# Patient Record
Sex: Female | Born: 1980 | Race: White | Hispanic: No | Marital: Married | Smoking: Current every day smoker
Health system: Southern US, Community
[De-identification: ages and names within clinical notes are randomized; demographics above are authoritative.]

## PROBLEM LIST (undated history)

## (undated) DIAGNOSIS — R112 Nausea with vomiting, unspecified: Secondary | ICD-10-CM

## (undated) DIAGNOSIS — I219 Acute myocardial infarction, unspecified: Secondary | ICD-10-CM

## (undated) DIAGNOSIS — I639 Cerebral infarction, unspecified: Secondary | ICD-10-CM

## (undated) DIAGNOSIS — N289 Disorder of kidney and ureter, unspecified: Secondary | ICD-10-CM

## (undated) DIAGNOSIS — I739 Peripheral vascular disease, unspecified: Secondary | ICD-10-CM

## (undated) DIAGNOSIS — I2510 Atherosclerotic heart disease of native coronary artery without angina pectoris: Secondary | ICD-10-CM

## (undated) DIAGNOSIS — G629 Polyneuropathy, unspecified: Secondary | ICD-10-CM

## (undated) DIAGNOSIS — I509 Heart failure, unspecified: Secondary | ICD-10-CM

## (undated) DIAGNOSIS — Z9889 Other specified postprocedural states: Secondary | ICD-10-CM

## (undated) DIAGNOSIS — N184 Chronic kidney disease, stage 4 (severe): Secondary | ICD-10-CM

## (undated) DIAGNOSIS — D649 Anemia, unspecified: Secondary | ICD-10-CM

## (undated) DIAGNOSIS — E119 Type 2 diabetes mellitus without complications: Secondary | ICD-10-CM

## (undated) HISTORY — DX: Acute myocardial infarction, unspecified: I21.9

## (undated) HISTORY — PX: TUBAL LIGATION: SHX77

## (undated) HISTORY — DX: Anemia, unspecified: D64.9

## (undated) HISTORY — PX: OTHER SURGICAL HISTORY: SHX169

## (undated) HISTORY — DX: Cerebral infarction, unspecified: I63.9

## (undated) HISTORY — PX: CHOLECYSTECTOMY: SHX55

---
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
## 2002-04-19 ENCOUNTER — Emergency Department (HOSPITAL_COMMUNITY): Admission: EM | Admit: 2002-04-19 | Discharge: 2002-04-19 | Payer: Self-pay | Admitting: Internal Medicine

## 2004-01-21 ENCOUNTER — Emergency Department (HOSPITAL_COMMUNITY): Admission: EM | Admit: 2004-01-21 | Discharge: 2004-01-21 | Payer: Self-pay | Admitting: Emergency Medicine

## 2004-01-22 ENCOUNTER — Observation Stay (HOSPITAL_COMMUNITY): Admission: AD | Admit: 2004-01-22 | Discharge: 2004-01-22 | Payer: Self-pay | Admitting: Family Medicine

## 2004-02-10 ENCOUNTER — Ambulatory Visit (HOSPITAL_COMMUNITY): Admission: RE | Admit: 2004-02-10 | Discharge: 2004-02-10 | Payer: Self-pay | Admitting: Family Medicine

## 2004-03-31 ENCOUNTER — Ambulatory Visit: Payer: Self-pay | Admitting: Gastroenterology

## 2004-04-02 ENCOUNTER — Ambulatory Visit (HOSPITAL_COMMUNITY): Admission: RE | Admit: 2004-04-02 | Discharge: 2004-04-02 | Payer: Self-pay | Admitting: Internal Medicine

## 2004-04-02 IMAGING — RF DG UGI W/ SMALL BOWEL
14 of 24 series · 14 of 24 positions shown · non-contrast
Comparison: none

CLINICAL DATA: Right lower quadrant abdominal pain and epigastric abdominal pain.  Diarrhea.  Family history of Crohn's disease.
 UPPER GI SERIES AND SMALL BOWEL FOLLOW-THROUGH:
 Double contrast upper GI series shows a normal appearance of the esophagus.  There is no evidence of an esophageal mass or stricture.  There is no evidence of a hiatal hernia, and no gastroesophageal reflux was seen during the study despite provocative maneuvers.  Esophageal motility is within normal limits.  
 The stomach is normal in appearance.  There is no evidence of gastric masses or ulcers.  The duodenal bulb and sweep are normal in appearance.  The patient swallowed a 13 mm barium tablet which passed freely through the esophagus and into the stomach.
 The small bowel follow-through shows no evidence of dilated small bowel loops.  No masses or strictures are seen involving the small bowel.  The jejunal and ileal fold patterns are within normal limits.  There is no evidence of small bowel wall thickening and spot views of the terminal ileum are normal in appearance.

[Series 3: run · 1 of 1 slices shown (1 of 14)]
[im 1/1]
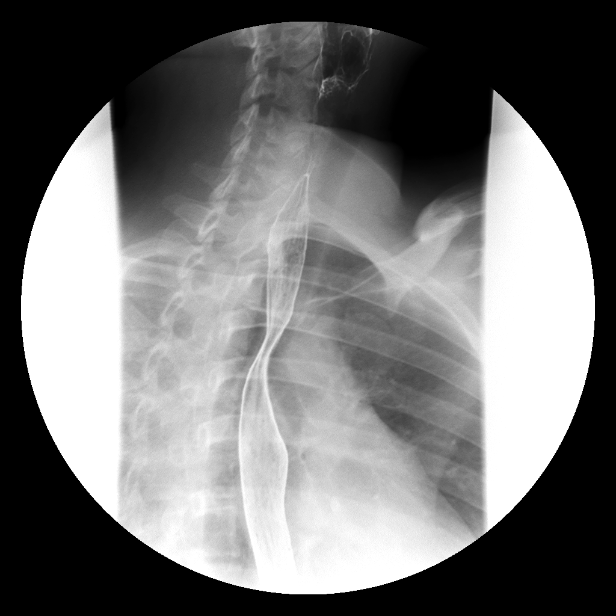

[Series 5: run · 1 of 1 slices shown (2 of 14)]
[im 1/1]
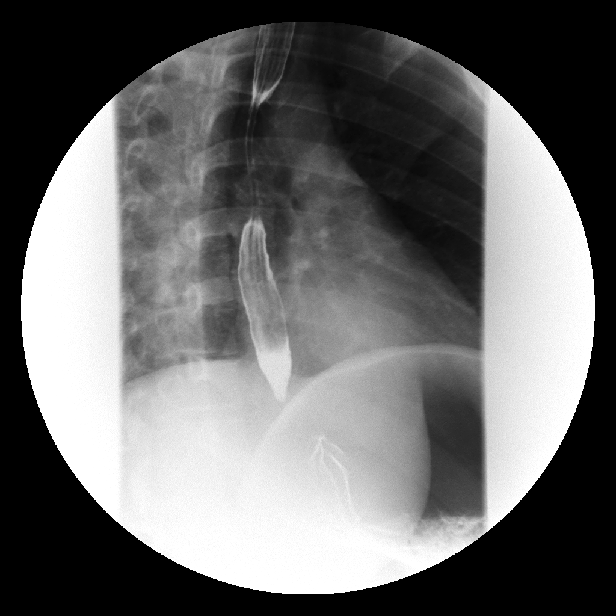

[Series 7: run · 1 of 1 slices shown (3 of 14)]
[im 1/1]
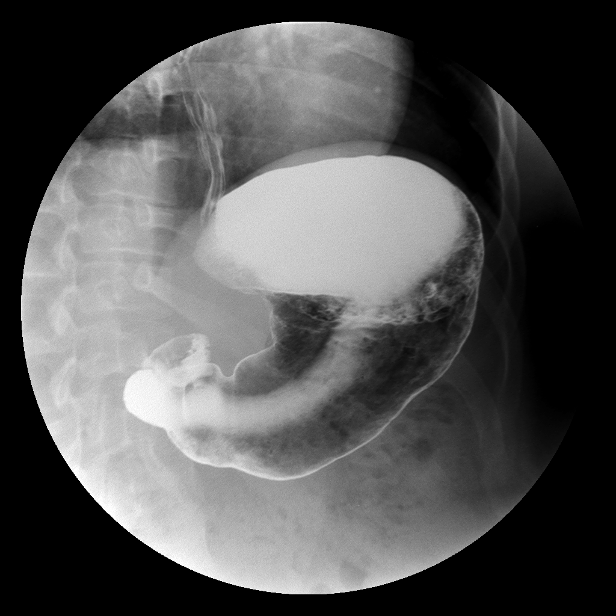

[Series 9: run · 1 of 1 slices shown (4 of 14)]
[im 1/1]
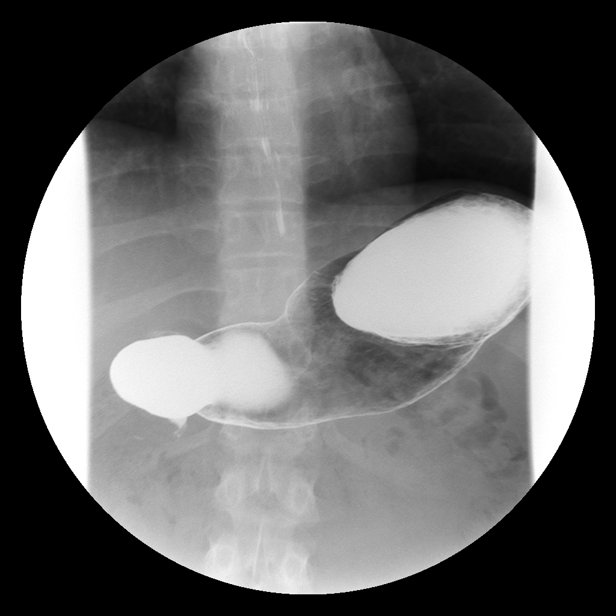

[Series 10: run · 1 of 1 slices shown (5 of 14)]
[im 1/1]
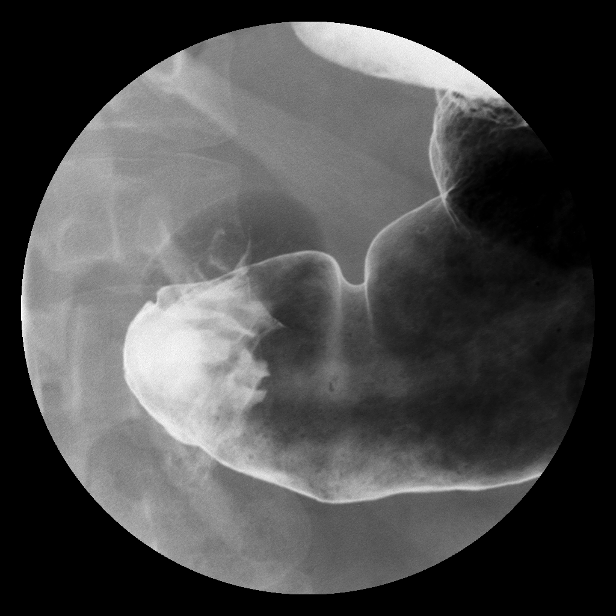

[Series 12: run · 1 of 1 slices shown (6 of 14)]
[im 1/1]
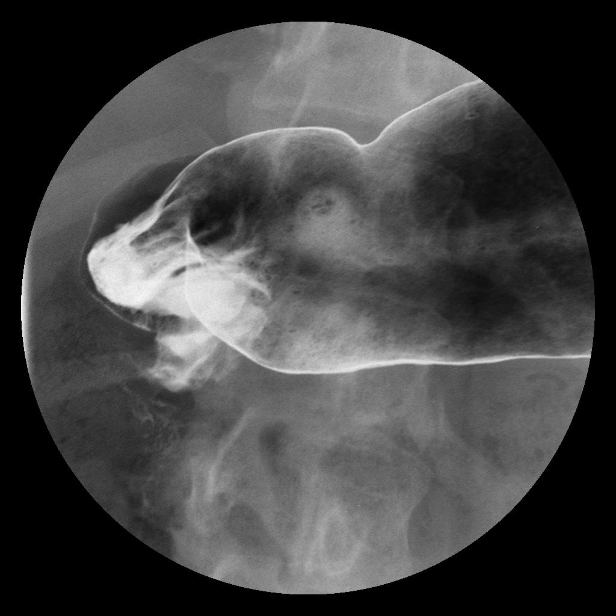

[Series 14: run · 1 of 1 slices shown (7 of 14)]
[im 1/1]
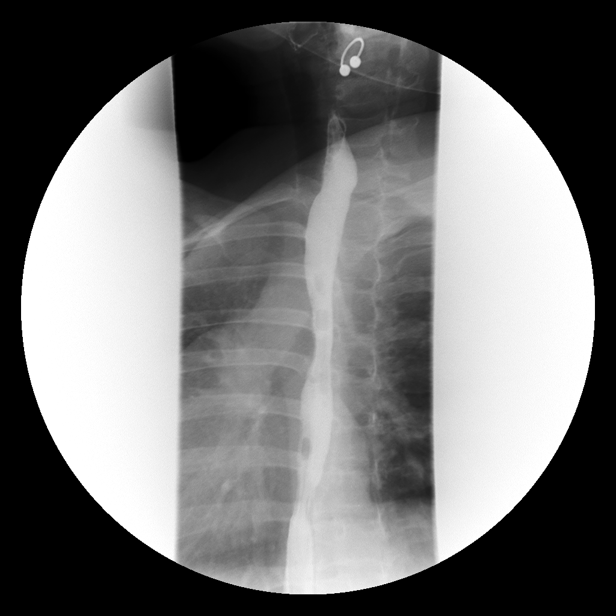

[Series 15: run · 1 of 1 slices shown (8 of 14)]
[im 1/1]
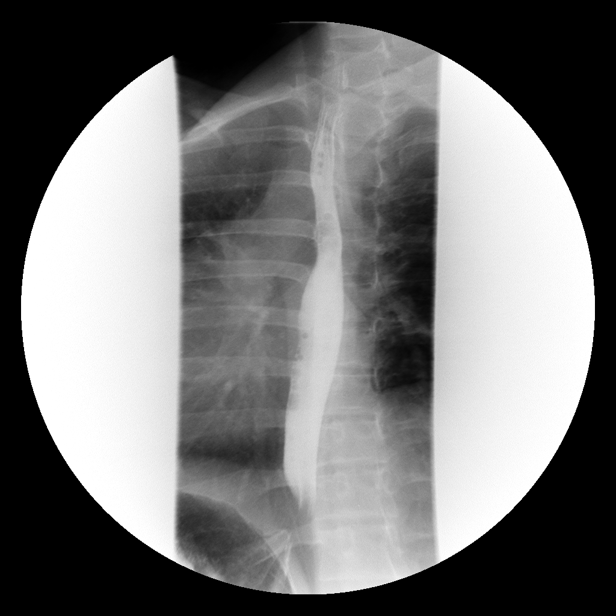

[Series 17: run · 1 of 1 slices shown (9 of 14)]
[im 1/1]
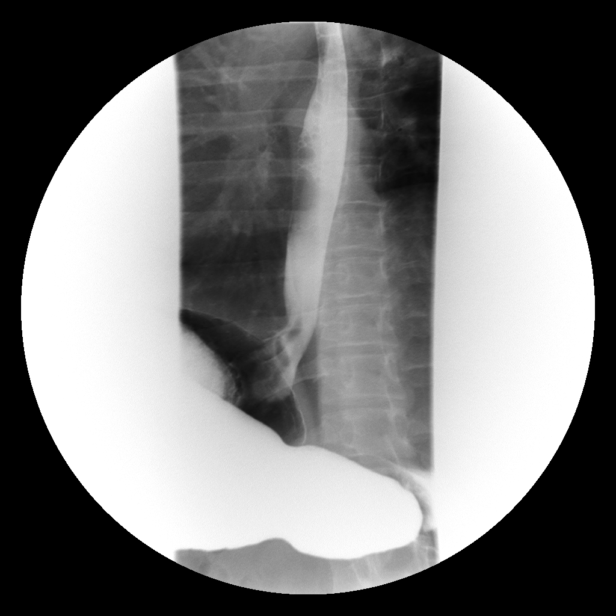

[Series 19: run · 1 of 1 slices shown (10 of 14)]
[im 1/1]
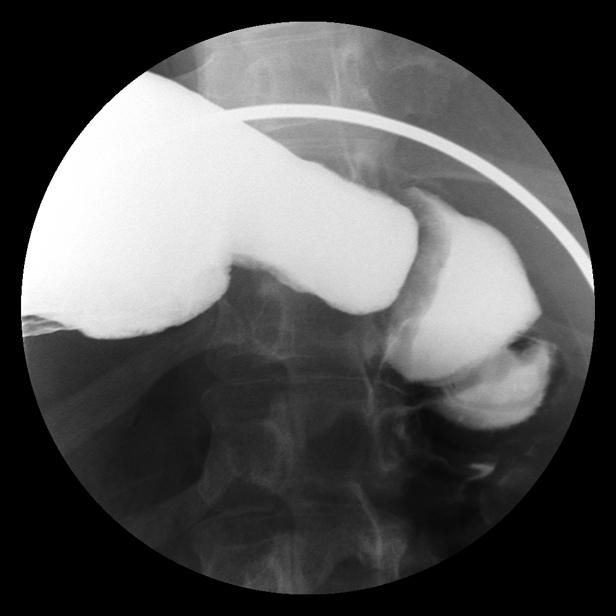

[Series 21: run · 1 of 1 slices shown (11 of 14)]
[im 1/1]
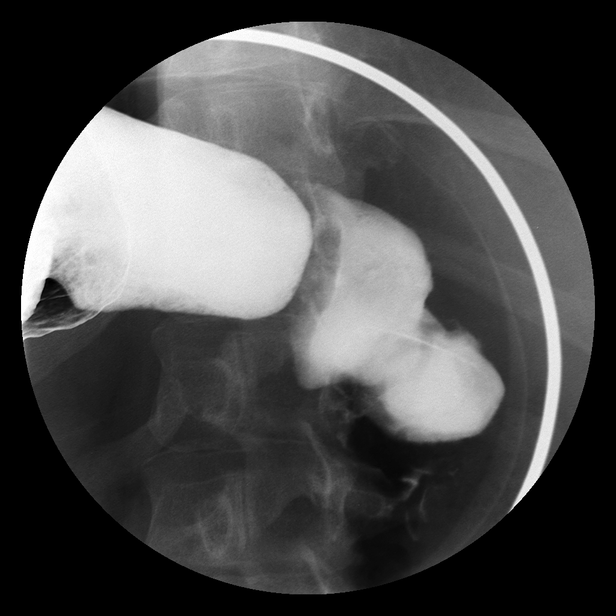

[Series 22: run · 1 of 1 slices shown (12 of 14)]
[im 1/1]
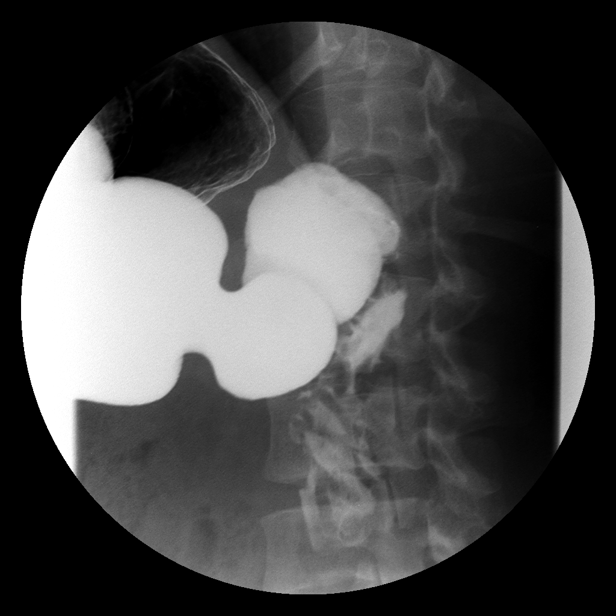

[Series 24: run · 1 of 1 slices shown (13 of 14)]
[im 1/1]
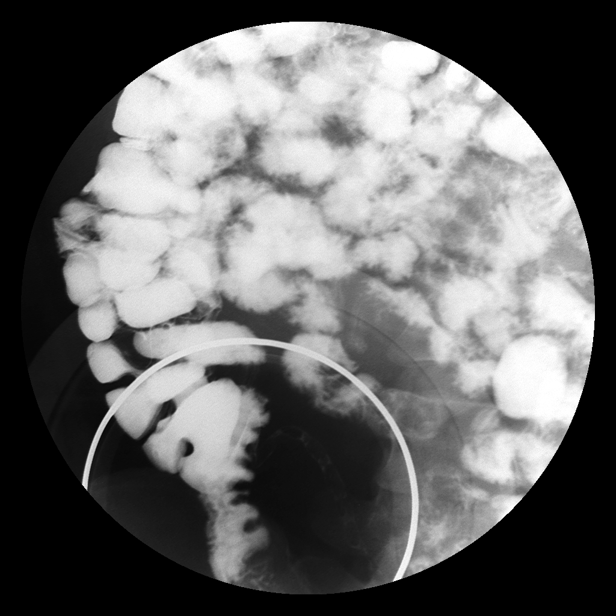

[Series 26: run · 1 of 1 slices shown (14 of 14)]
[im 1/1]
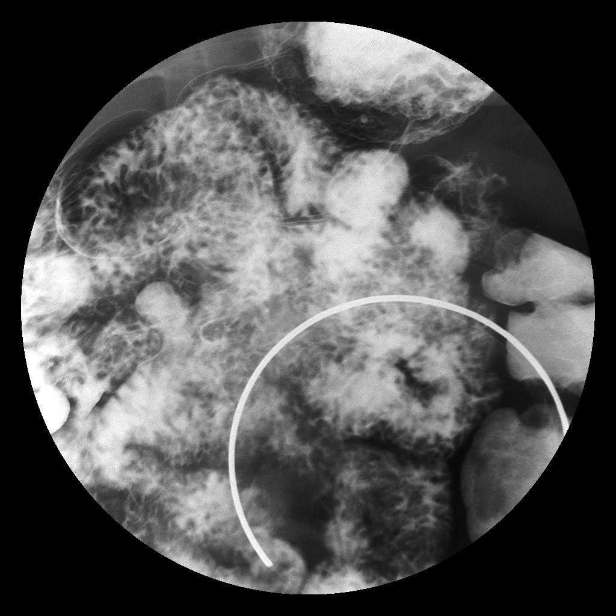

[14 of 24 positions shown; findings below may reference images not displayed]

IMPRESSION: 1.  Negative upper GI series.
 2.  Negative small bowel follow-through.

## 2004-07-06 ENCOUNTER — Emergency Department (HOSPITAL_COMMUNITY): Admission: EM | Admit: 2004-07-06 | Discharge: 2004-07-06 | Payer: Self-pay | Admitting: Emergency Medicine

## 2004-07-06 IMAGING — CR DG CHEST 1V PORT
1 series · 1 of 1 positions shown · non-contrast
Comparison: none

CLINICAL DATA: Patient has chest wall pain.
 PORTABLE CHEST ONE VIEW:
 AP view of the chest reveals the heart size to be normal.  Markings are accentuated in the lung bases without active findings.

[view not recorded]
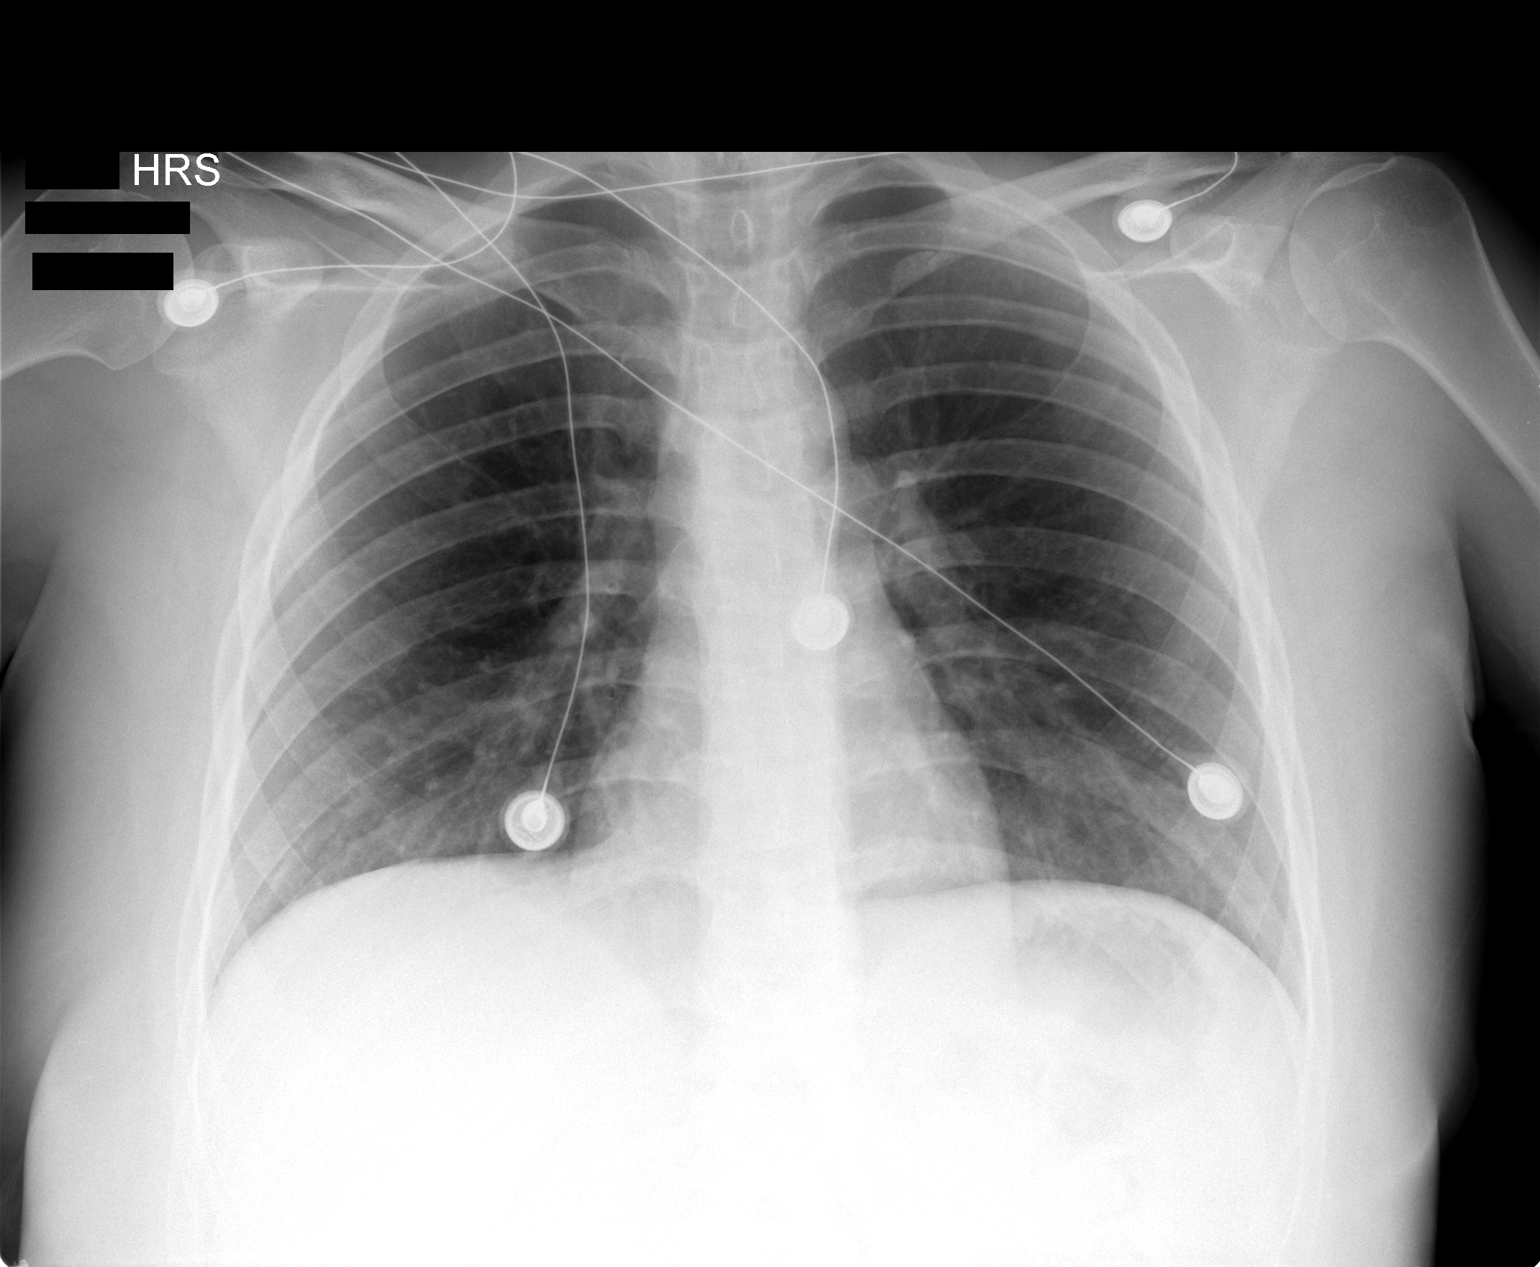

[1 of 1 positions shown; findings below may reference images not displayed]

IMPRESSION: No active disease.

## 2004-08-04 ENCOUNTER — Emergency Department (HOSPITAL_COMMUNITY): Admission: EM | Admit: 2004-08-04 | Discharge: 2004-08-04 | Payer: Self-pay | Admitting: Emergency Medicine

## 2004-08-05 ENCOUNTER — Inpatient Hospital Stay (HOSPITAL_COMMUNITY): Admission: AD | Admit: 2004-08-05 | Discharge: 2004-08-07 | Payer: Self-pay | Admitting: Internal Medicine

## 2004-08-21 ENCOUNTER — Ambulatory Visit (HOSPITAL_COMMUNITY): Admission: RE | Admit: 2004-08-21 | Discharge: 2004-08-21 | Payer: Self-pay | Admitting: General Surgery

## 2005-01-05 ENCOUNTER — Emergency Department (HOSPITAL_COMMUNITY): Admission: EM | Admit: 2005-01-05 | Discharge: 2005-01-05 | Payer: Self-pay | Admitting: Emergency Medicine

## 2005-01-05 IMAGING — CR DG CHEST 2V
2 series · 2 of 2 positions shown · non-contrast
Comparison: [DATE].

CLINICAL DATA: 24-year-old female, with chest pain, shortness of breath, dizziness.  History of smoking 1 pack per day. 
 2-VIEW CHEST RADIOGRAPH:

[view not recorded (1 of 2)]
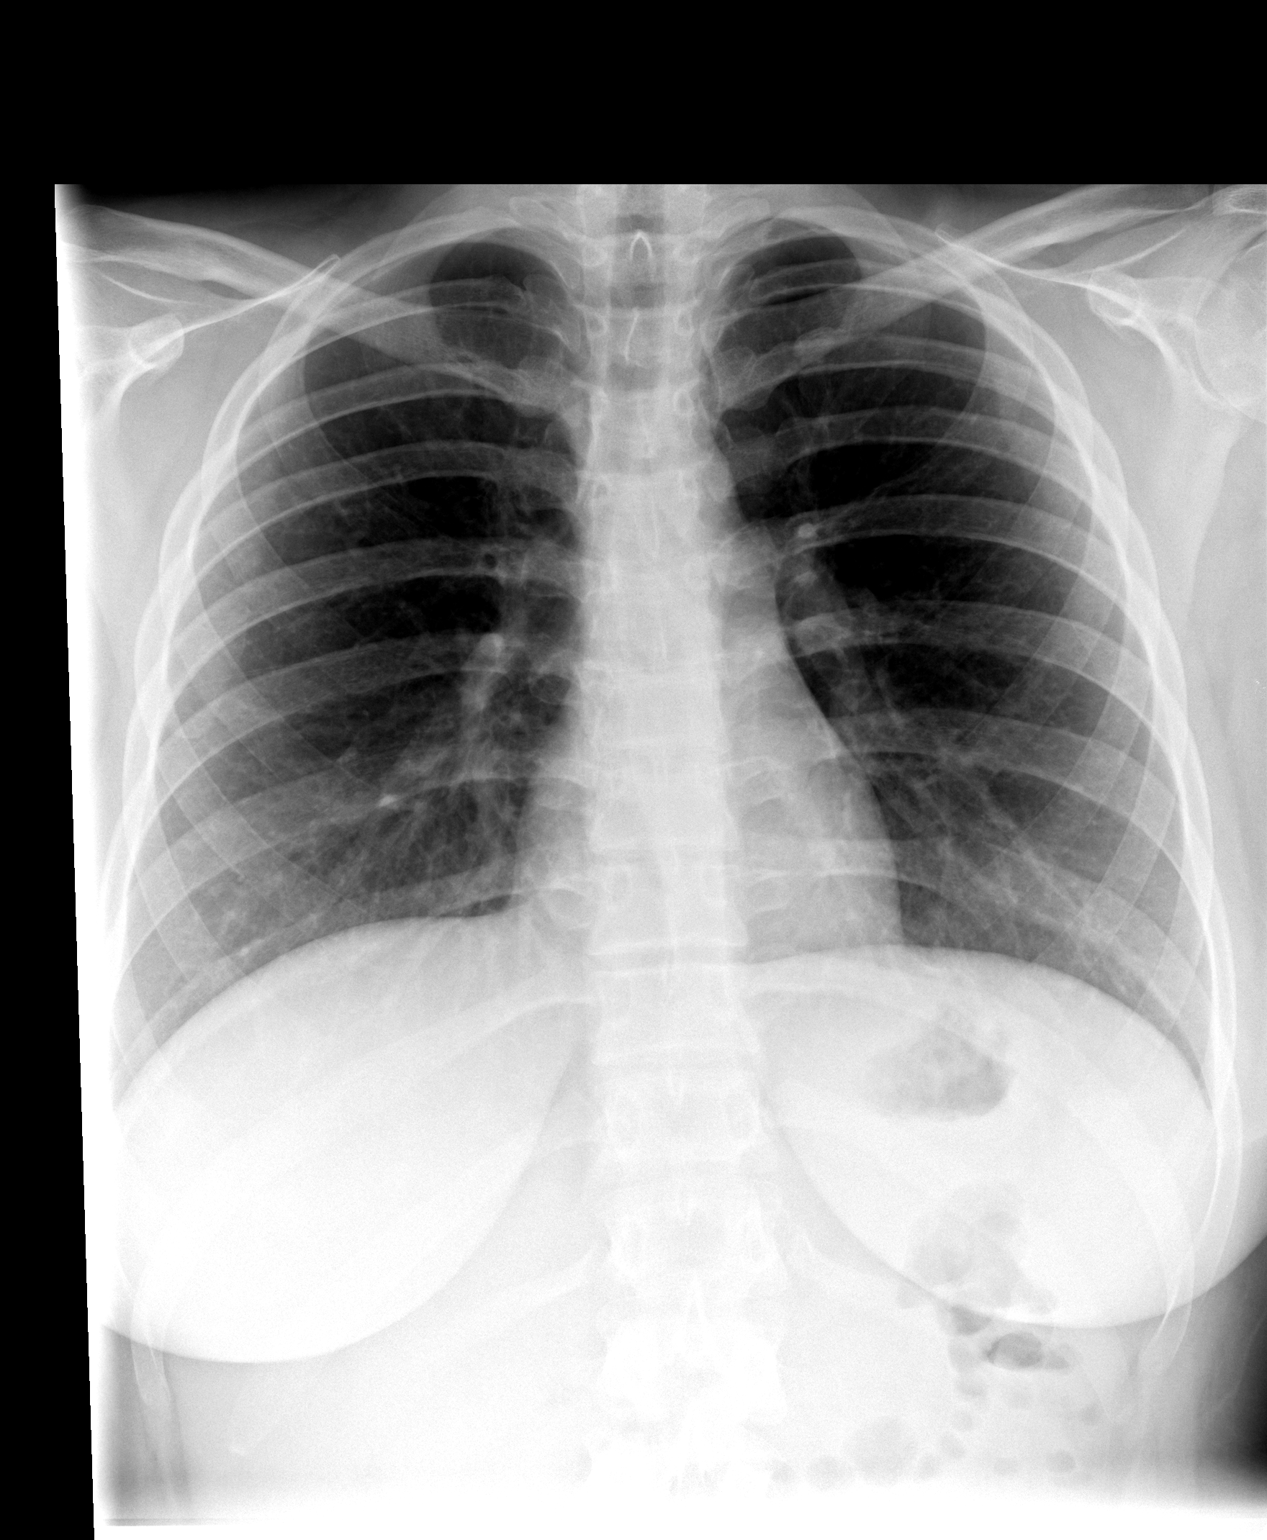

[view not recorded (2 of 2)]
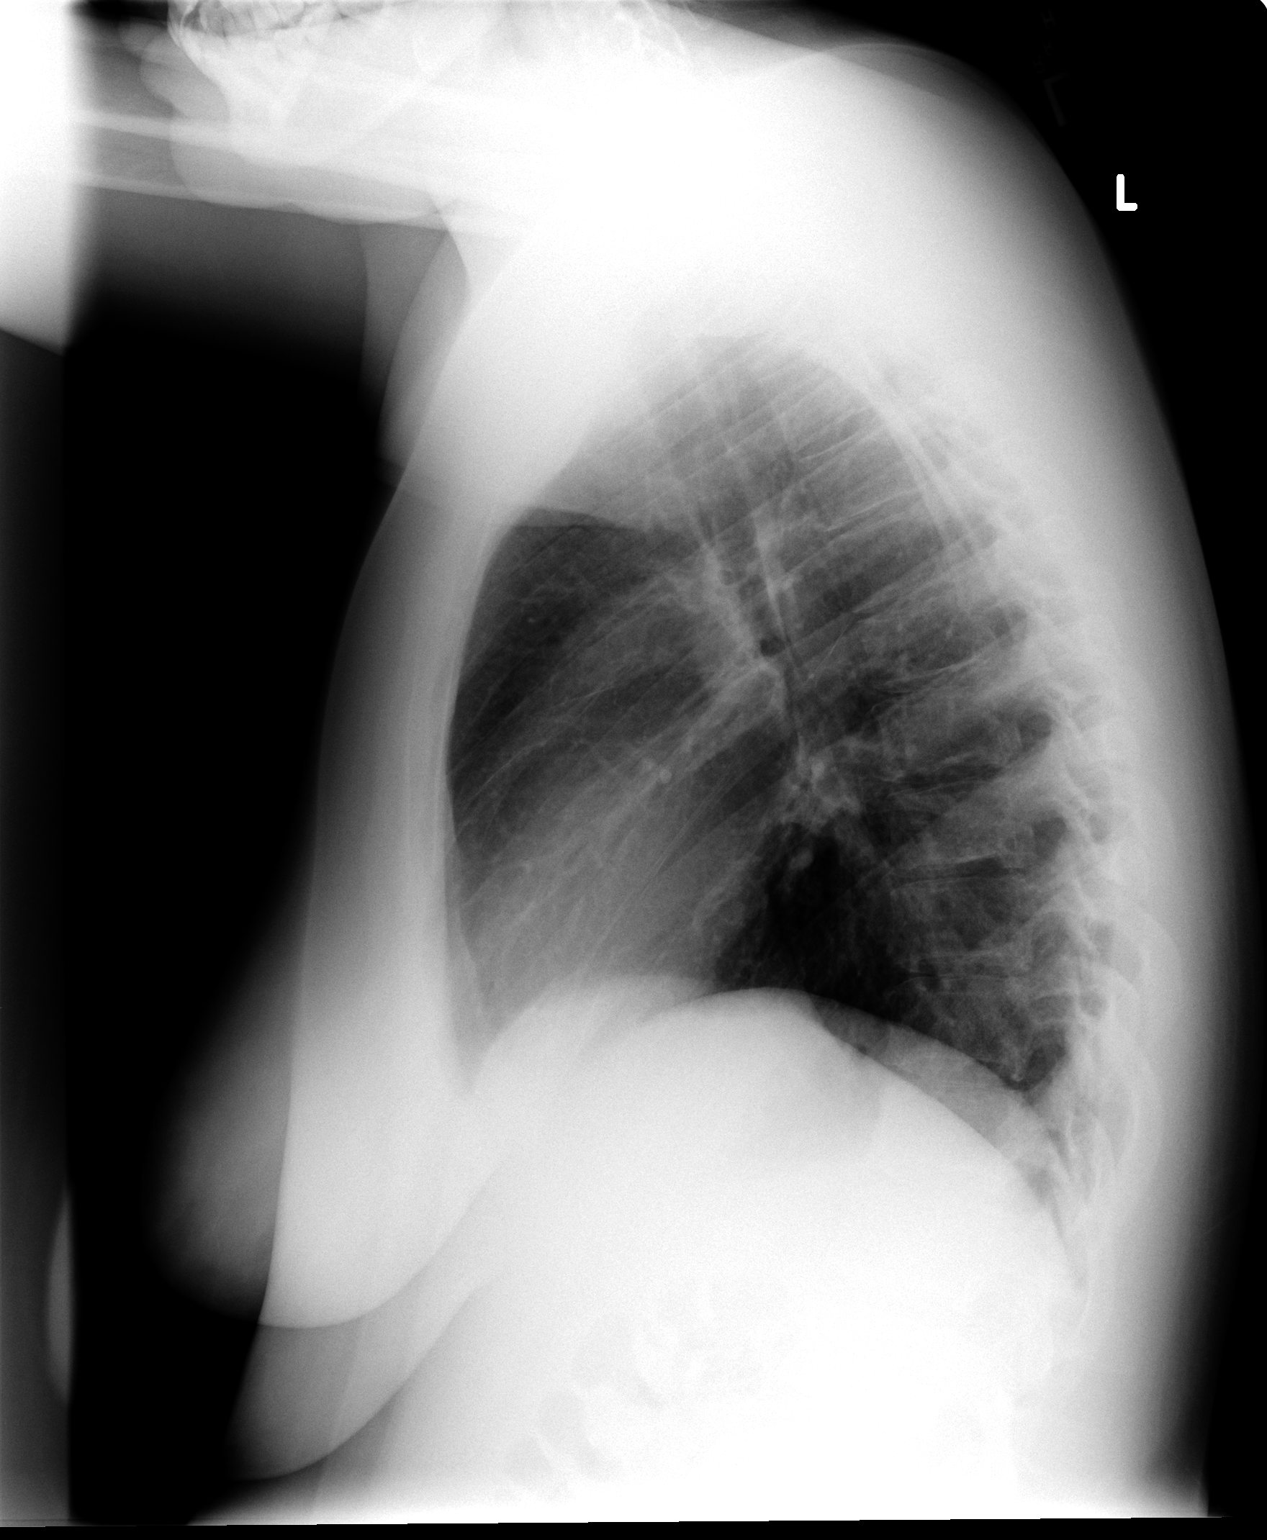

[2 of 2 positions shown; findings below may reference images not displayed]

FINDINGS: Normal heart size.  Mild central bronchitic change.  No acute pneumonia, edema, effusions, or pneumothorax.
IMPRESSION: Mild bronchitic change without acute chest process.

## 2005-04-19 ENCOUNTER — Inpatient Hospital Stay (HOSPITAL_COMMUNITY): Admission: EM | Admit: 2005-04-19 | Discharge: 2005-04-21 | Payer: Self-pay | Admitting: Emergency Medicine

## 2005-05-22 ENCOUNTER — Emergency Department (HOSPITAL_COMMUNITY): Admission: EM | Admit: 2005-05-22 | Discharge: 2005-05-22 | Payer: Self-pay | Admitting: *Deleted

## 2005-09-15 ENCOUNTER — Emergency Department (HOSPITAL_COMMUNITY): Admission: EM | Admit: 2005-09-15 | Discharge: 2005-09-15 | Payer: Self-pay | Admitting: Emergency Medicine

## 2005-09-15 ENCOUNTER — Encounter (INDEPENDENT_AMBULATORY_CARE_PROVIDER_SITE_OTHER): Payer: Self-pay | Admitting: Specialist

## 2007-09-30 ENCOUNTER — Emergency Department (HOSPITAL_COMMUNITY): Admission: EM | Admit: 2007-09-30 | Discharge: 2007-09-30 | Payer: Self-pay | Admitting: Emergency Medicine

## 2007-12-04 ENCOUNTER — Ambulatory Visit (HOSPITAL_COMMUNITY): Admission: RE | Admit: 2007-12-04 | Discharge: 2007-12-04 | Payer: Self-pay | Admitting: Obstetrics & Gynecology

## 2007-12-04 IMAGING — US US OB DETAIL+14 WK
1 series · 14 of 28 positions shown · non-contrast
Comparison: none

OBSTETRICAL ULTRASOUND:
 This ultrasound was performed in The [HOSPITAL], and the [facility] report will be stored to [REDACTED] PACS.

[Series 1: us ob detail+14 wk · 14 of 116 slices shown]
[im 5/116]
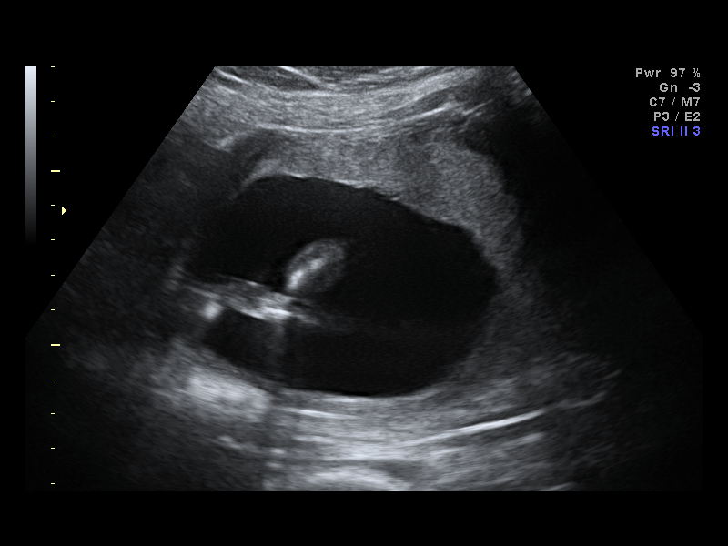
[im 13/116]
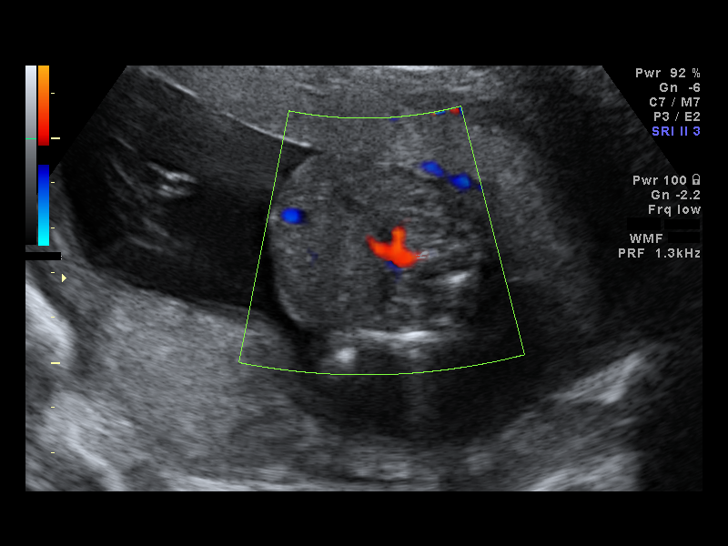
[im 22/116]
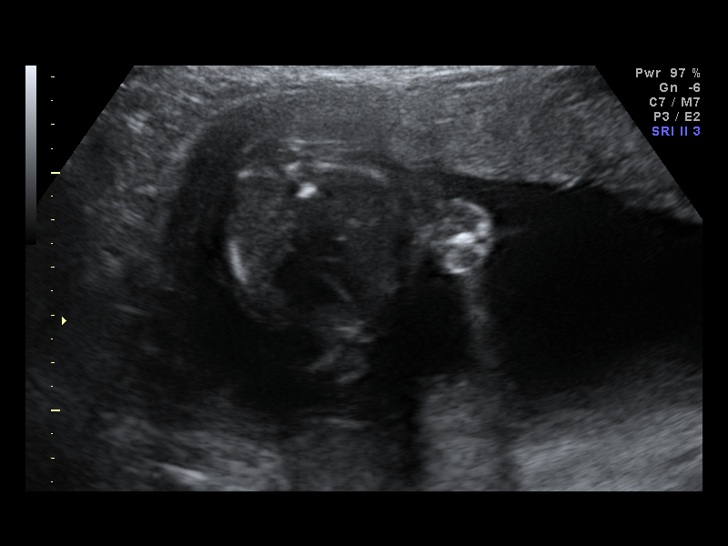
[im 30/116]
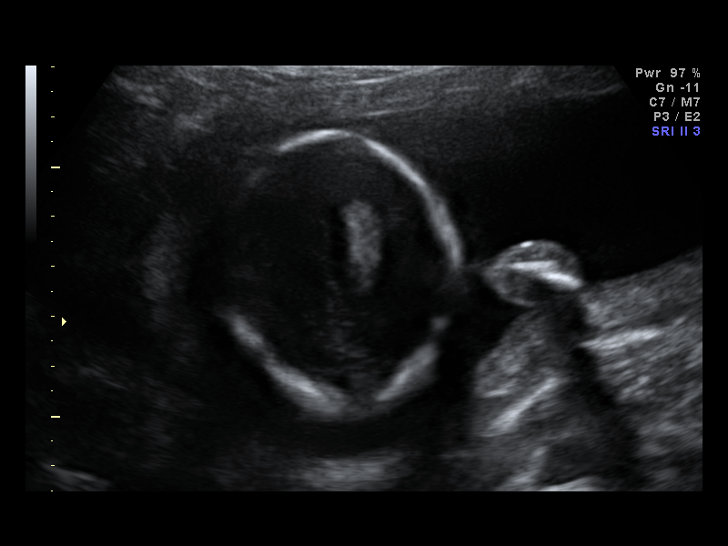
[im 39/116]
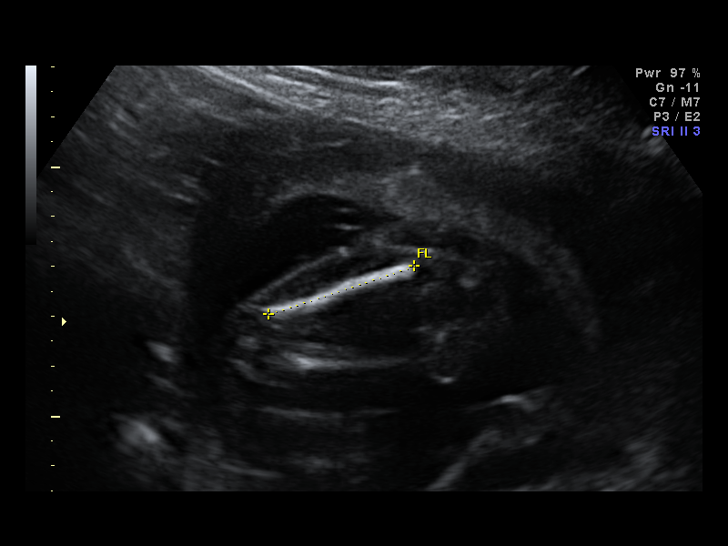
[im 47/116]
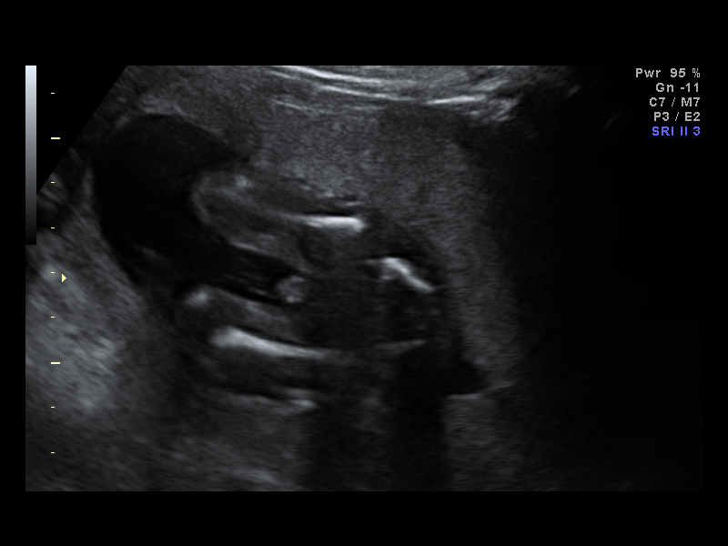
[im 56/116]
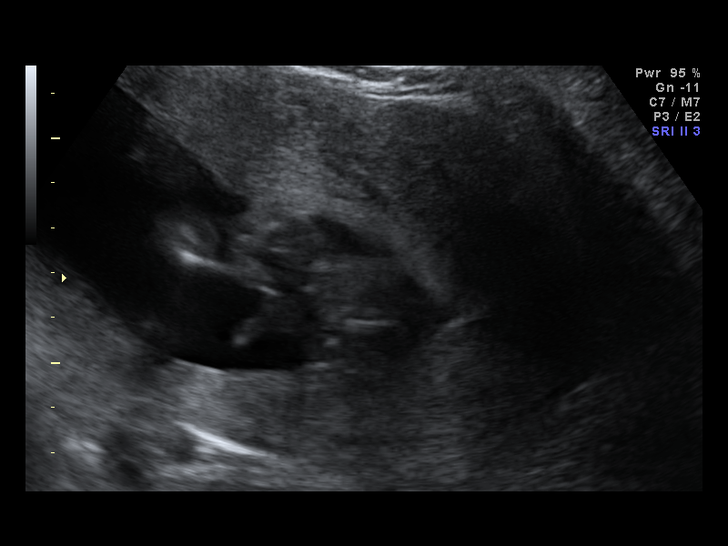
[im 64/116]
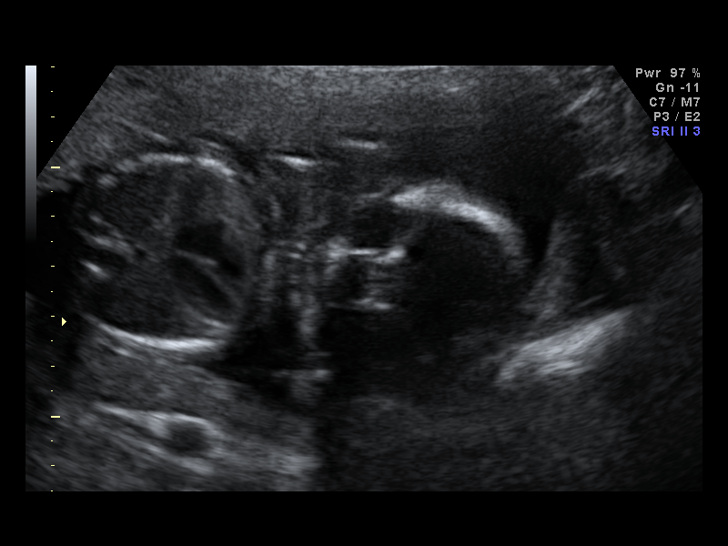
[im 73/116]
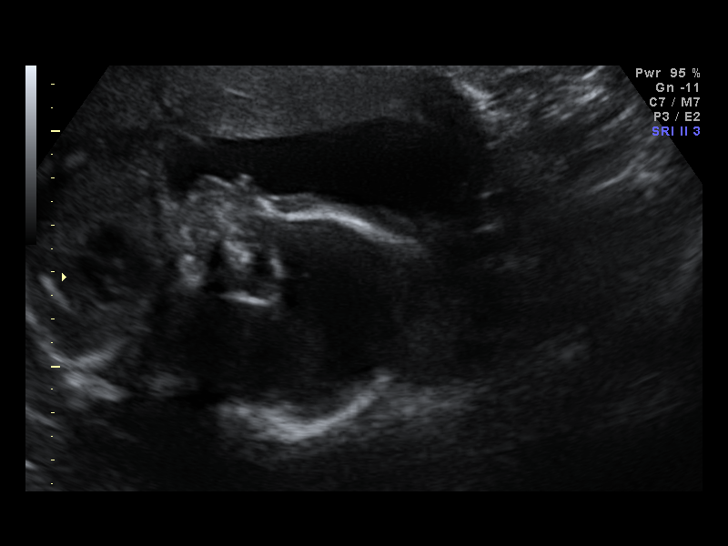
[im 81/116]
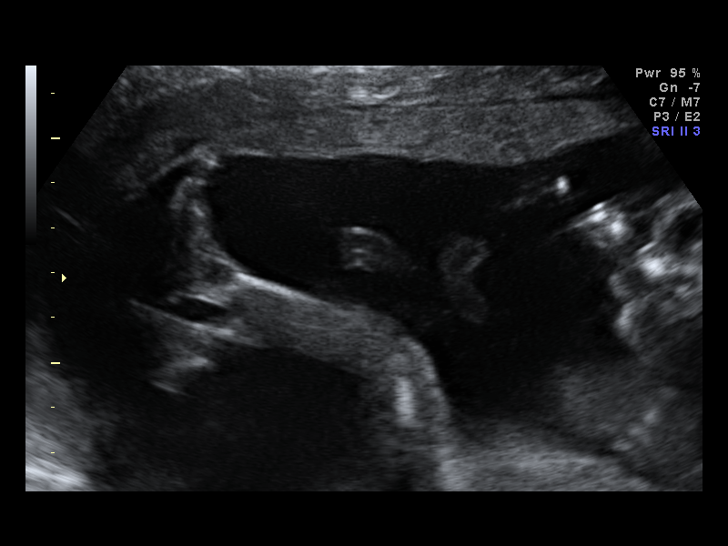
[im 90/116]
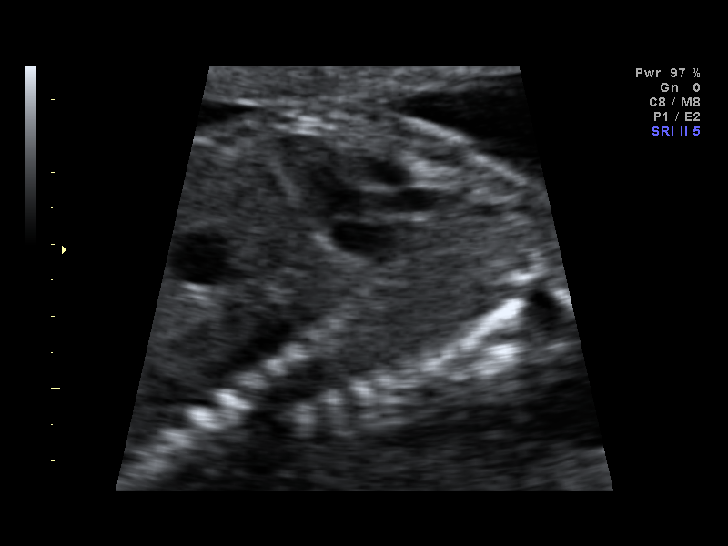
[im 98/116]
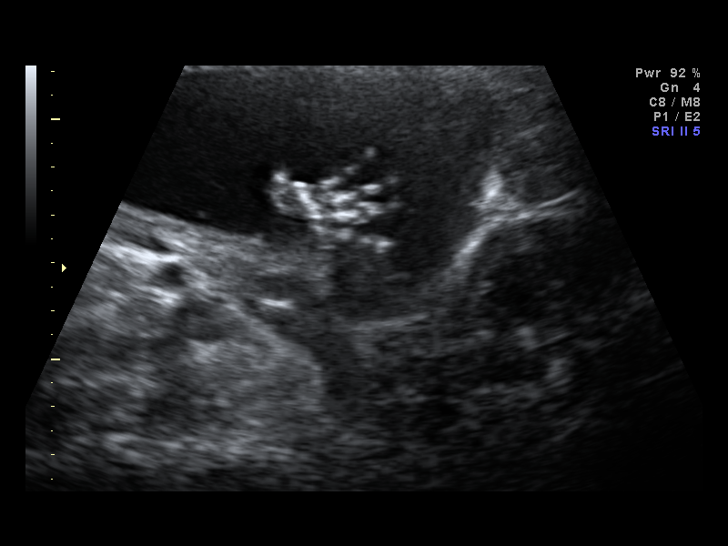
[im 107/116]
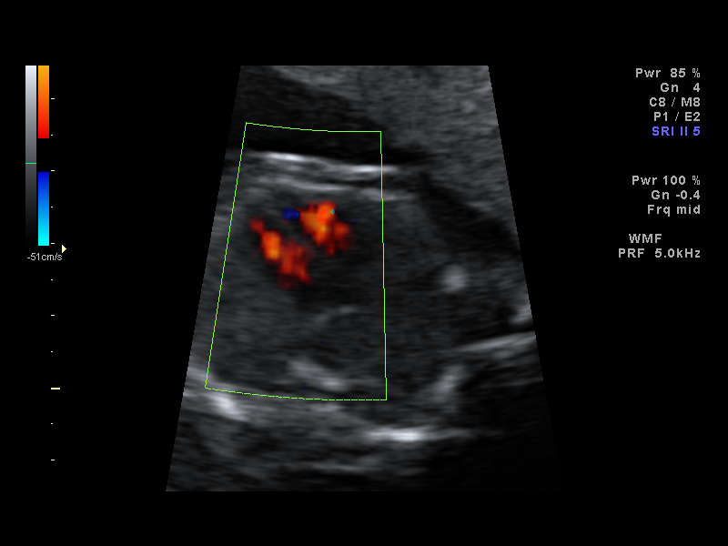
[im 116/116]
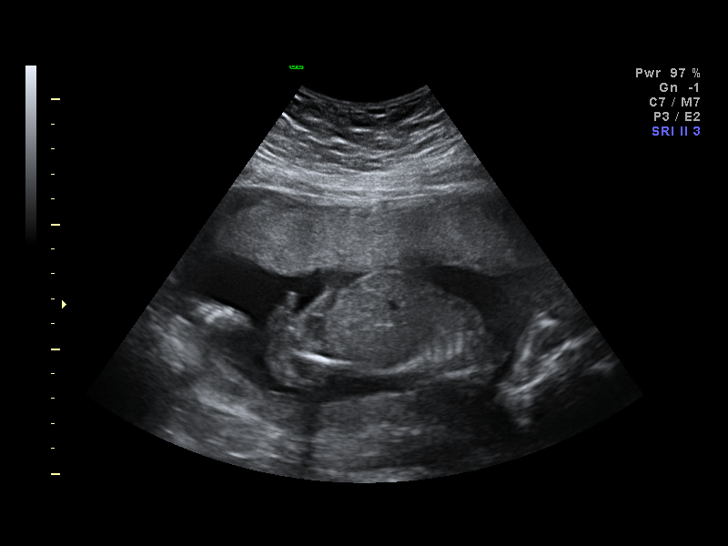

[14 of 28 positions shown; findings below may reference images not displayed]

IMPRESSION: [facility] has also been faxed to the ordering physician.

## 2008-01-09 ENCOUNTER — Ambulatory Visit (HOSPITAL_COMMUNITY): Admission: RE | Admit: 2008-01-09 | Discharge: 2008-01-09 | Payer: Self-pay | Admitting: Obstetrics & Gynecology

## 2008-01-09 IMAGING — US US OB FOLLOW-UP
1 series · 18 of 28 positions shown · non-contrast
Comparison: none

OBSTETRICAL ULTRASOUND:
 This ultrasound was performed in The [[facility]OSP[facility]], and the [facility] report will be stored to [REDACTED] PACS.

[Series 1: us ob follow-up · 18 of 34 slices shown]
[im 1/34]
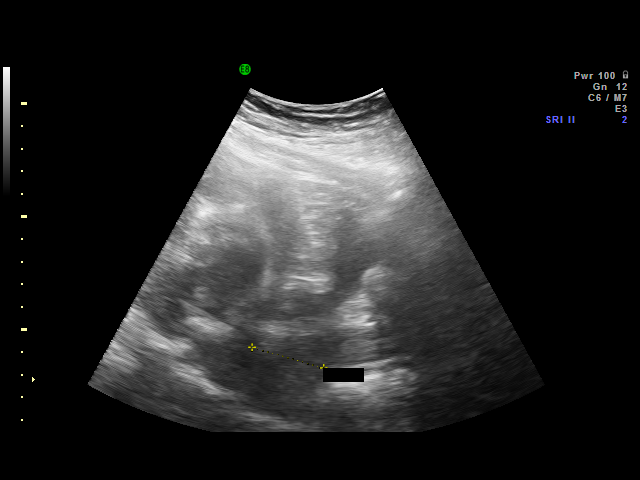
[im 3/34]
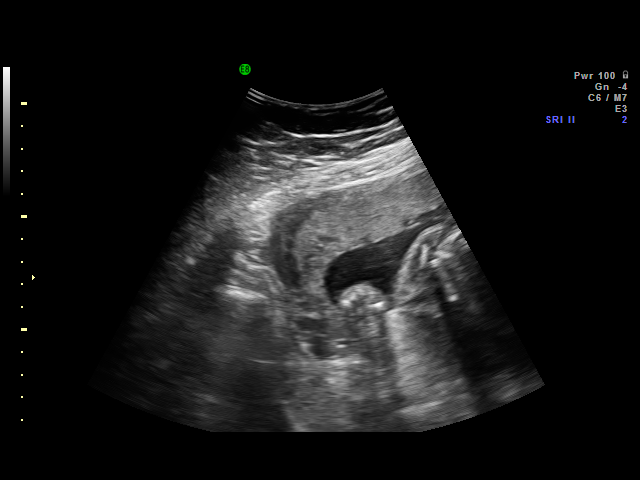
[im 4/34]
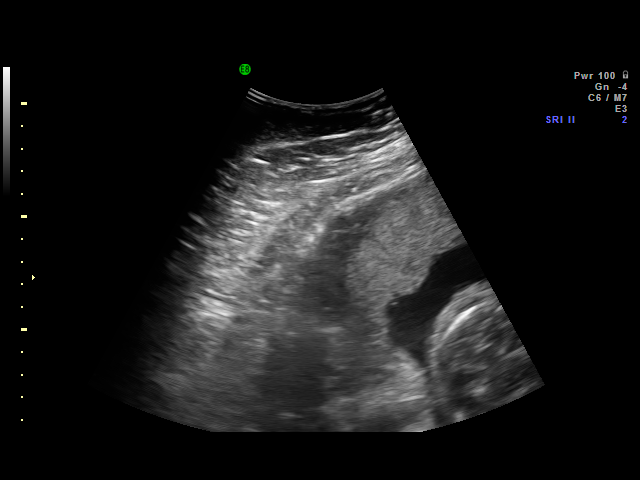
[im 7/34]
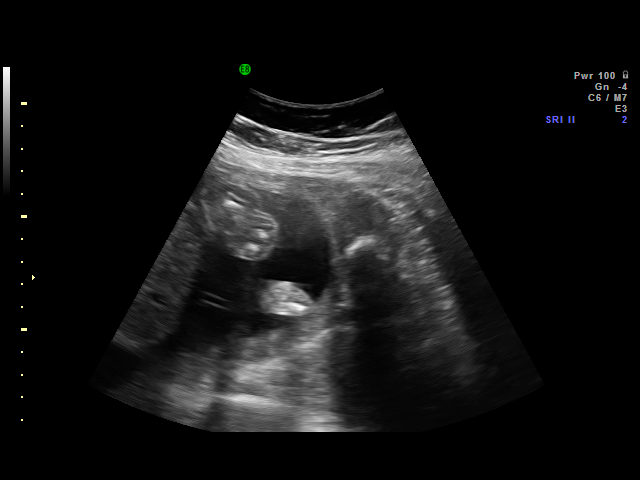
[im 9/34]
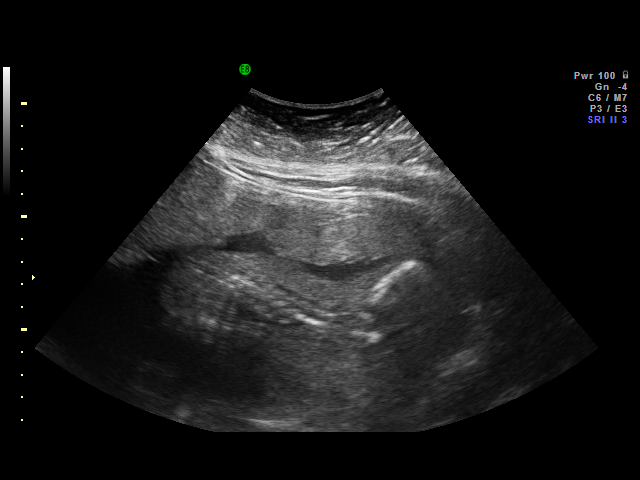
[im 10/34]
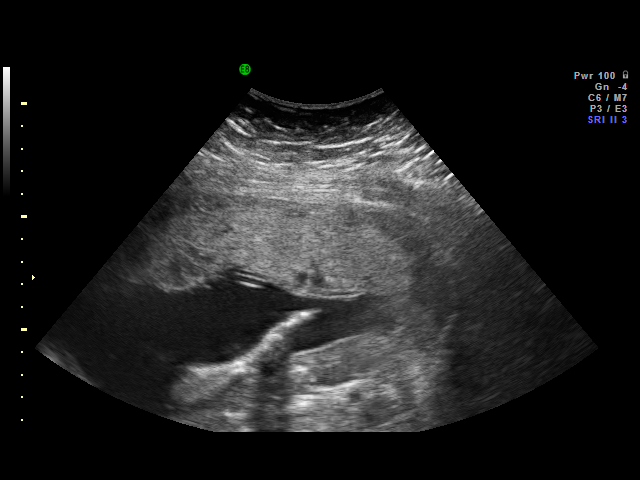
[im 13/34]
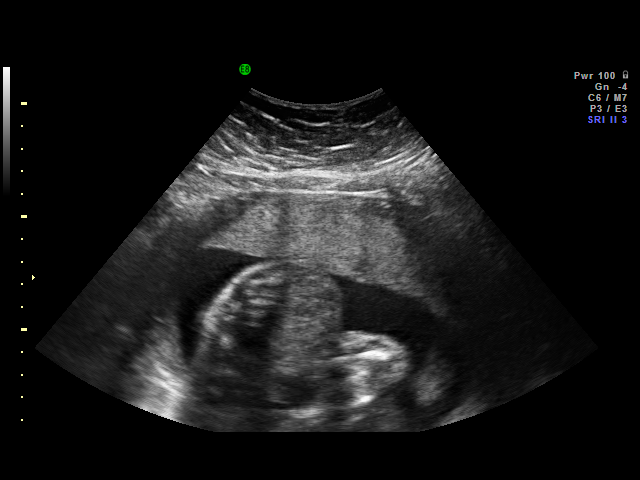
[im 14/34]
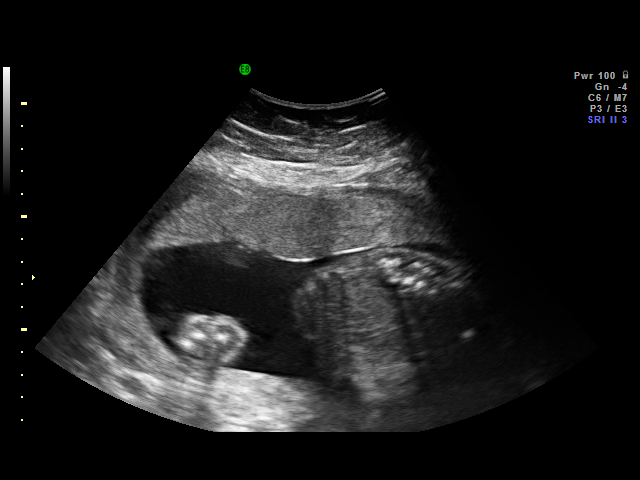
[im 16/34]
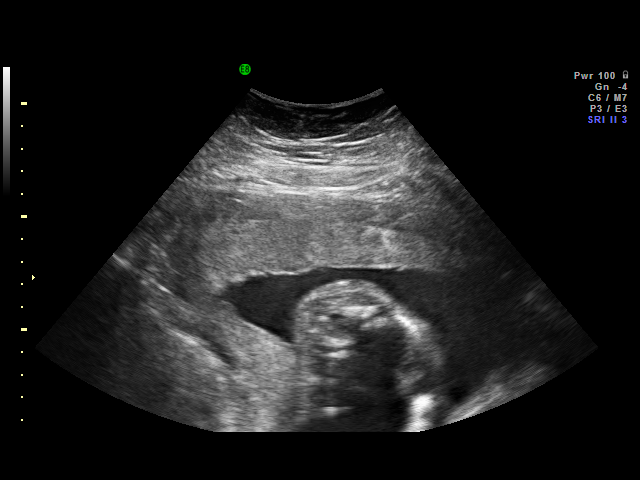
[im 18/34]
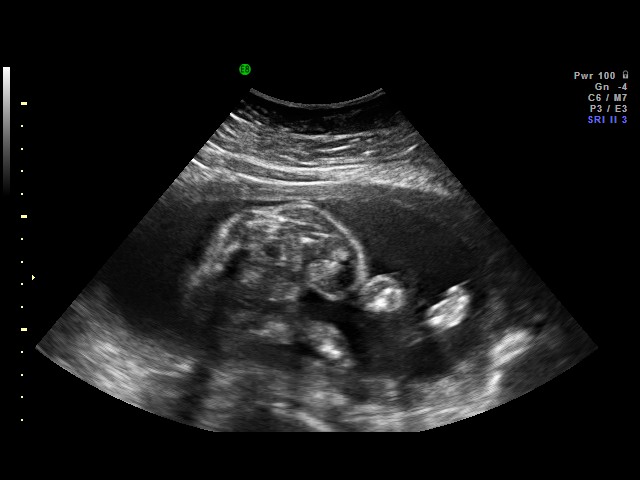
[im 20/34]
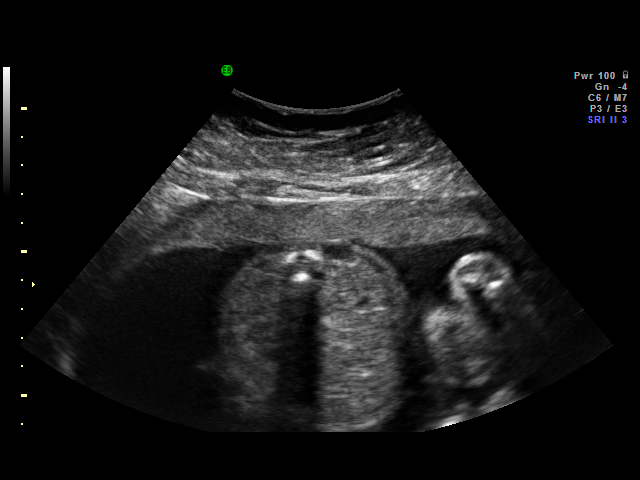
[im 21/34]
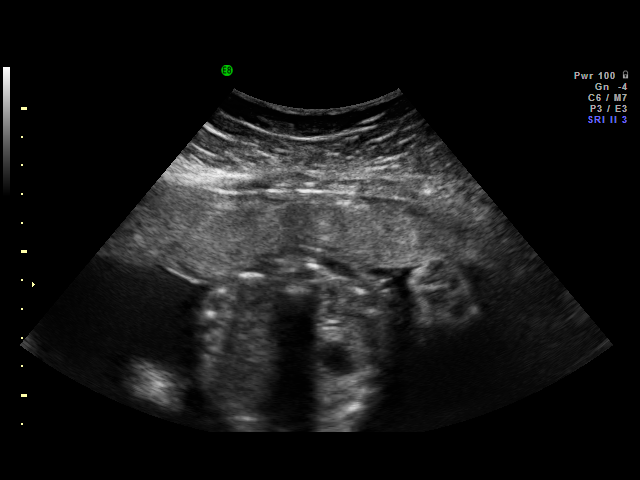
[im 24/34]
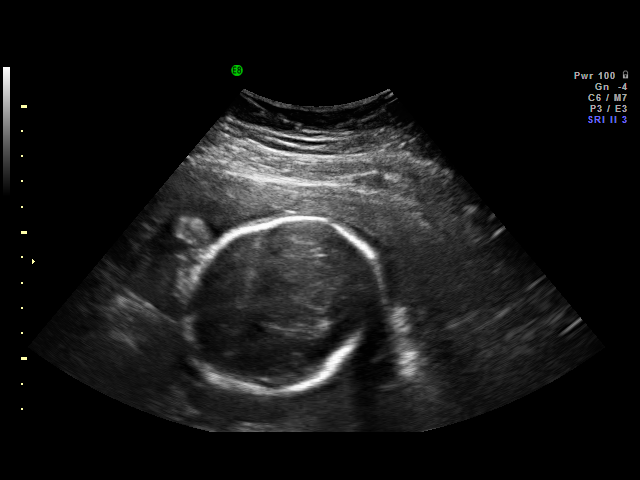
[im 26/34]
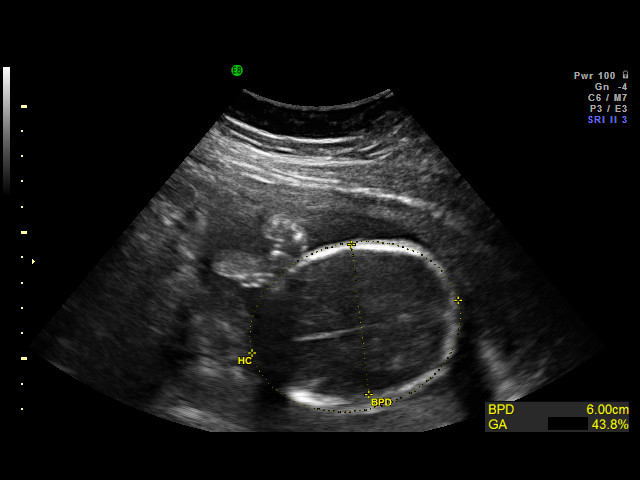
[im 27/34]
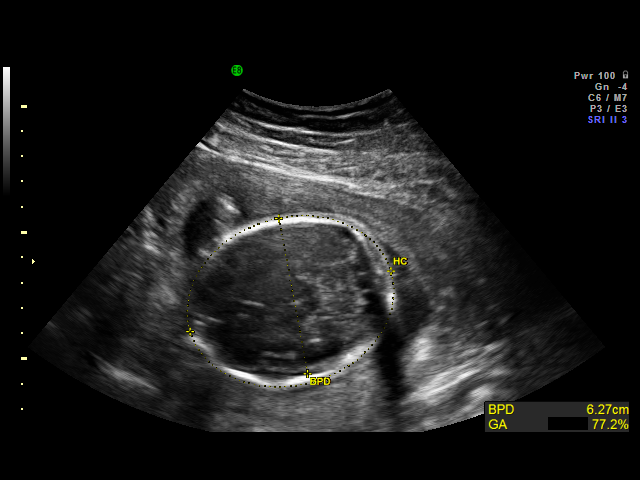
[im 30/34]
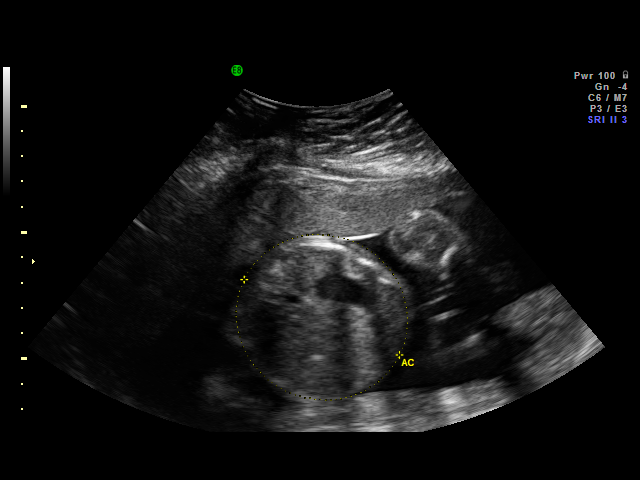
[im 31/34]
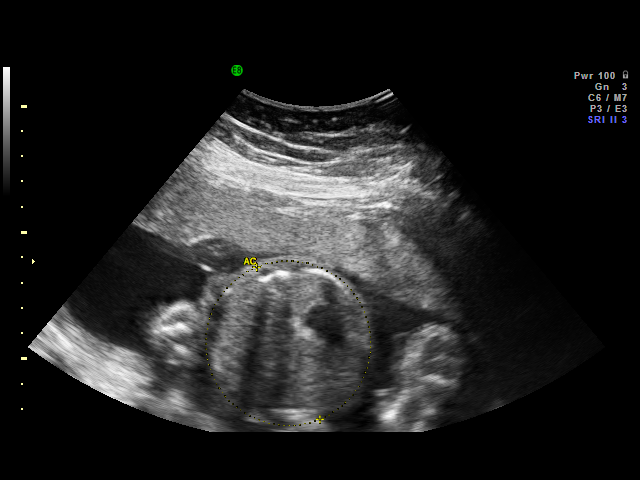
[im 34/34]
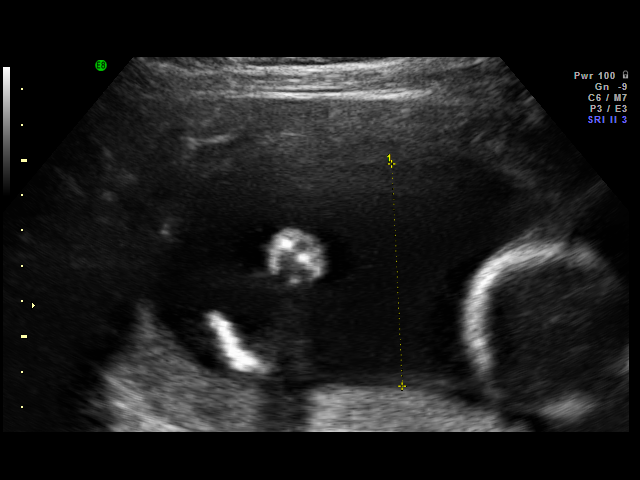

[18 of 28 positions shown; findings below may reference images not displayed]

IMPRESSION: [facility] has also been faxed to the ordering physician.

## 2008-01-18 ENCOUNTER — Other Ambulatory Visit: Admission: RE | Admit: 2008-01-18 | Discharge: 2008-01-18 | Payer: Self-pay | Admitting: Obstetrics & Gynecology

## 2008-01-27 ENCOUNTER — Inpatient Hospital Stay (HOSPITAL_COMMUNITY): Admission: AD | Admit: 2008-01-27 | Discharge: 2008-01-27 | Payer: Self-pay | Admitting: Gynecology

## 2008-02-05 ENCOUNTER — Ambulatory Visit (HOSPITAL_COMMUNITY): Admission: RE | Admit: 2008-02-05 | Discharge: 2008-02-05 | Payer: Self-pay | Admitting: Obstetrics & Gynecology

## 2008-02-05 IMAGING — US US OB FOLLOW-UP
1 series · 14 of 28 positions shown · non-contrast
Comparison: none

OBSTETRICAL ULTRASOUND:
 This ultrasound was performed in The [[facility]OS[facility]], and the [facility] report will be stored to [REDACTED] PACS.

[Series 1: us ob follow-up · 14 of 40 slices shown]
[im 2/40]
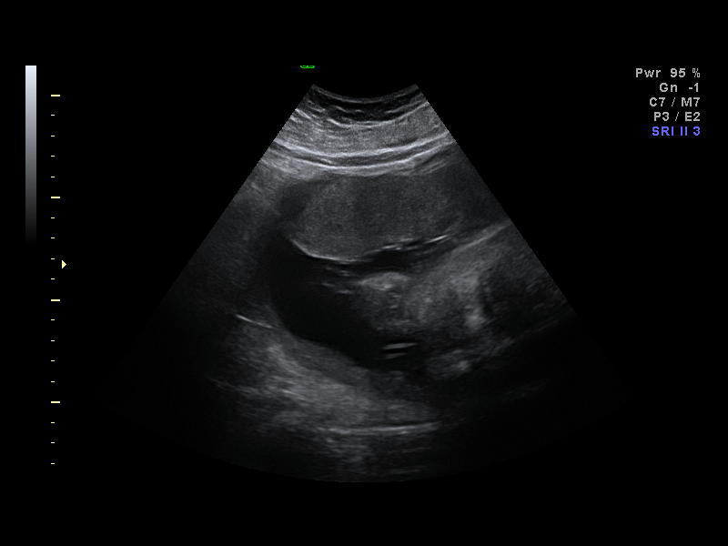
[im 5/40]
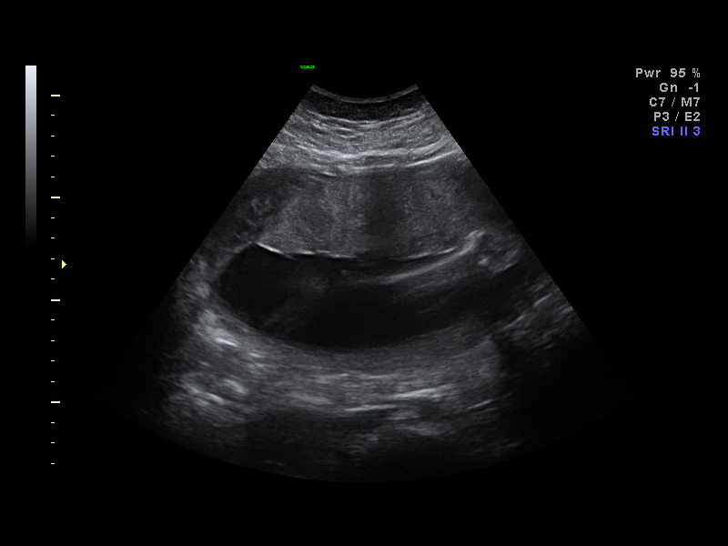
[im 8/40]
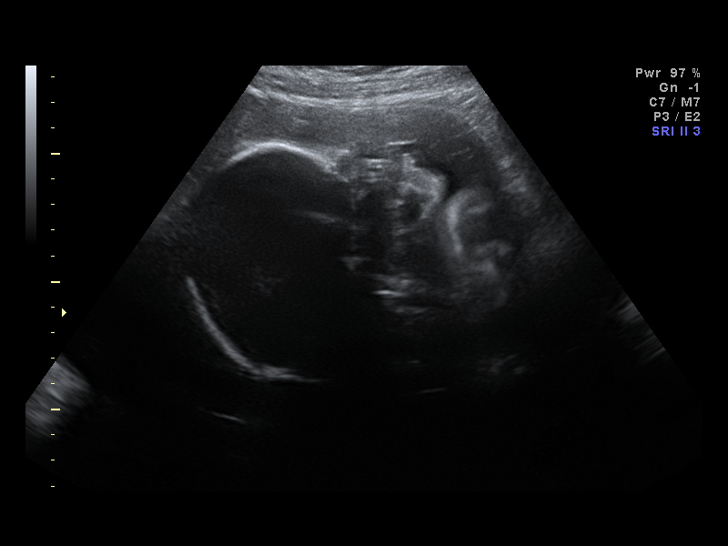
[im 11/40]
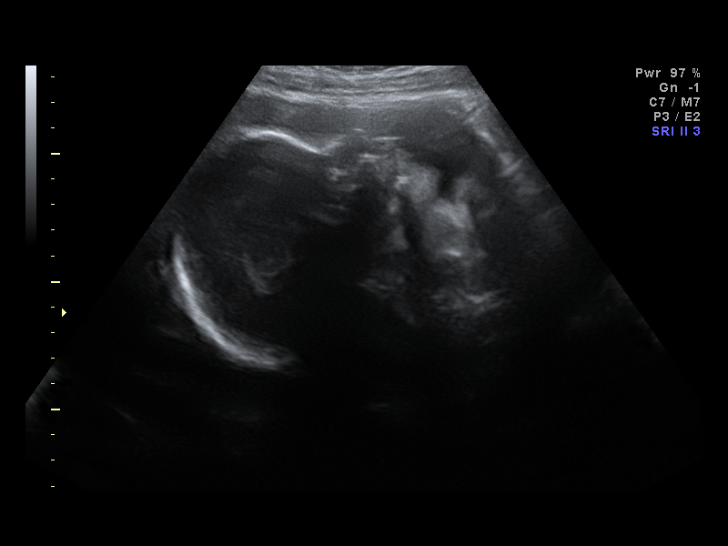
[im 14/40]
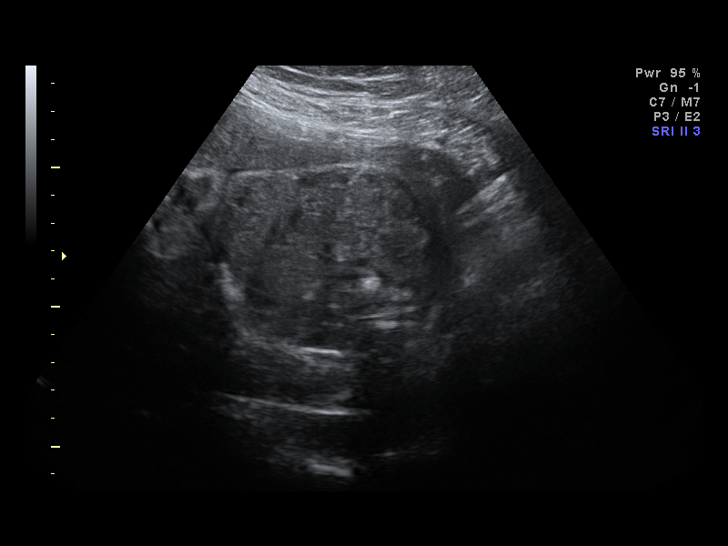
[im 16/40]
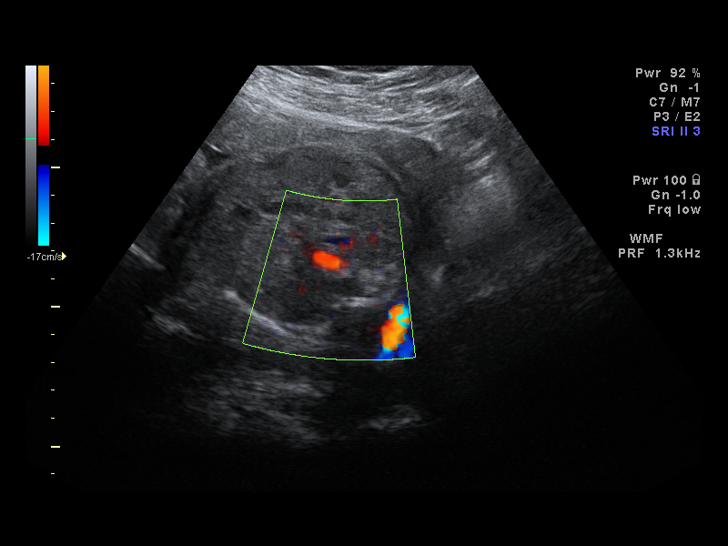
[im 19/40]
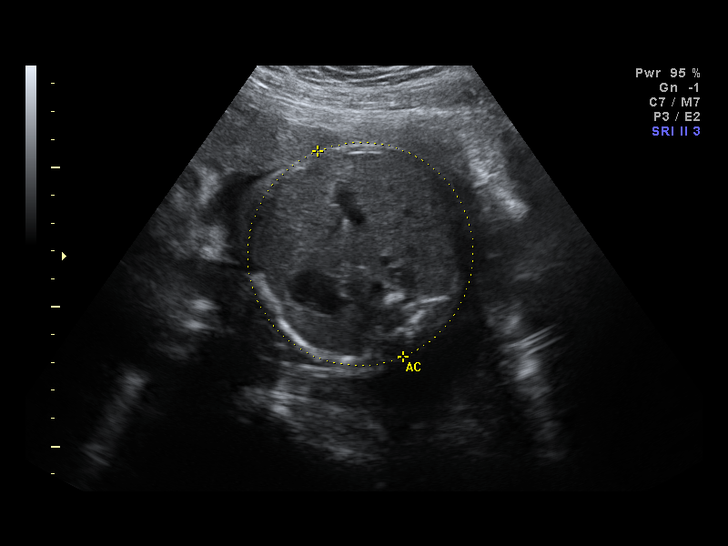
[im 22/40]
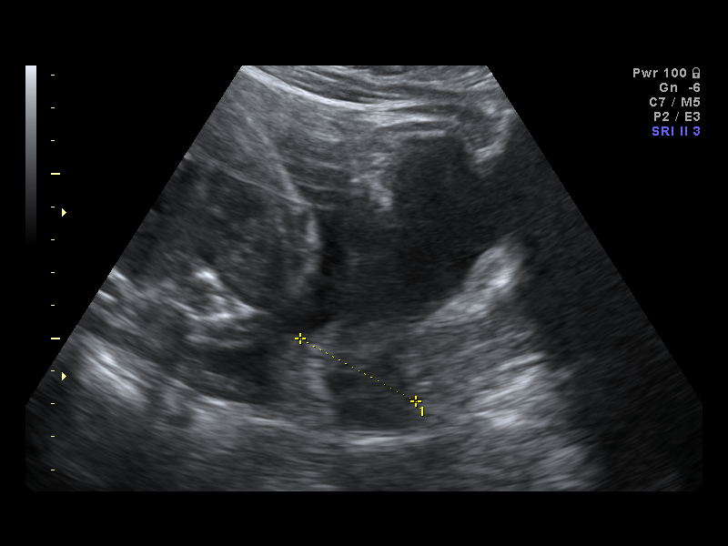
[im 25/40]
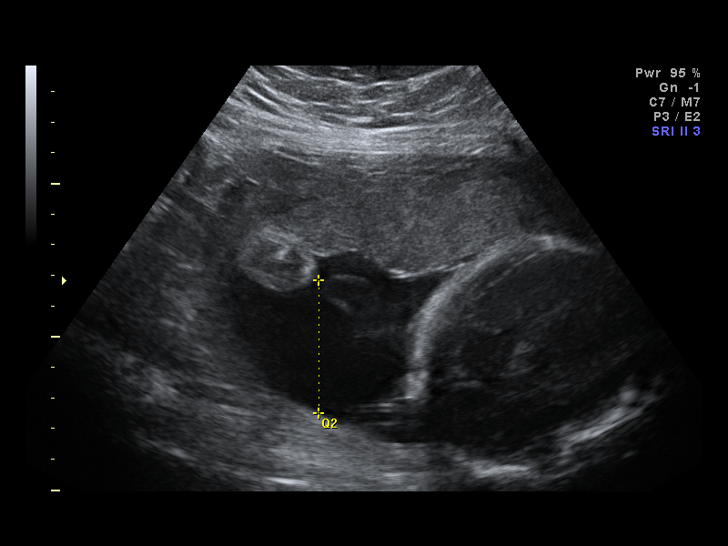
[im 28/40]
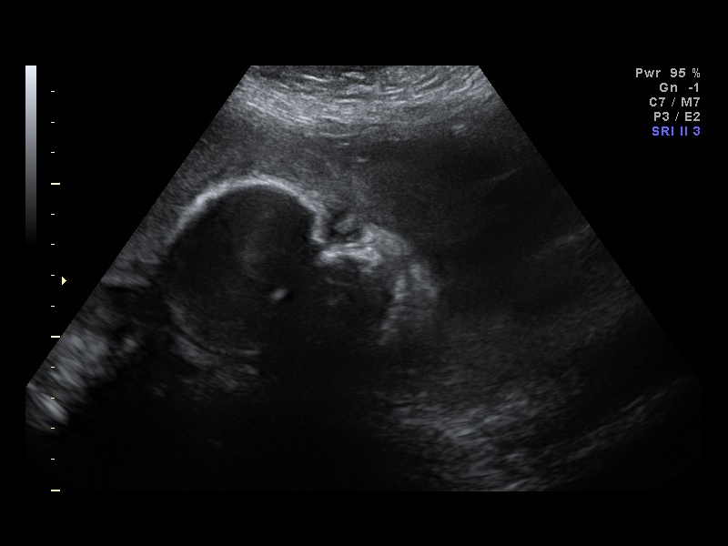
[im 31/40]
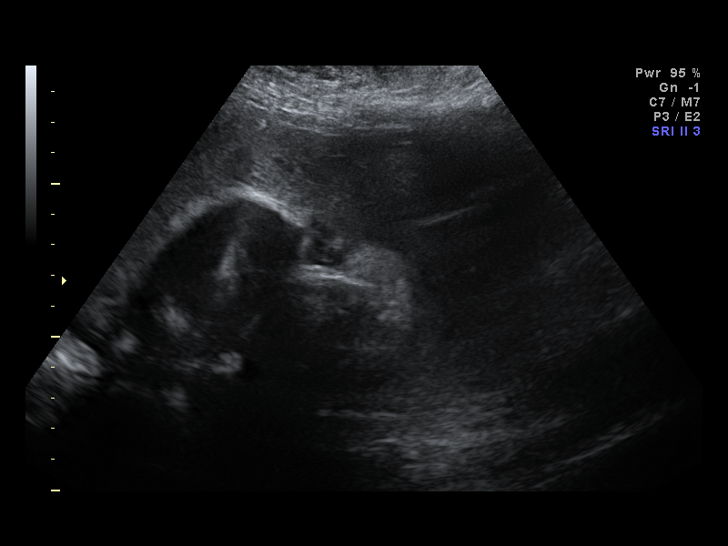
[im 34/40]
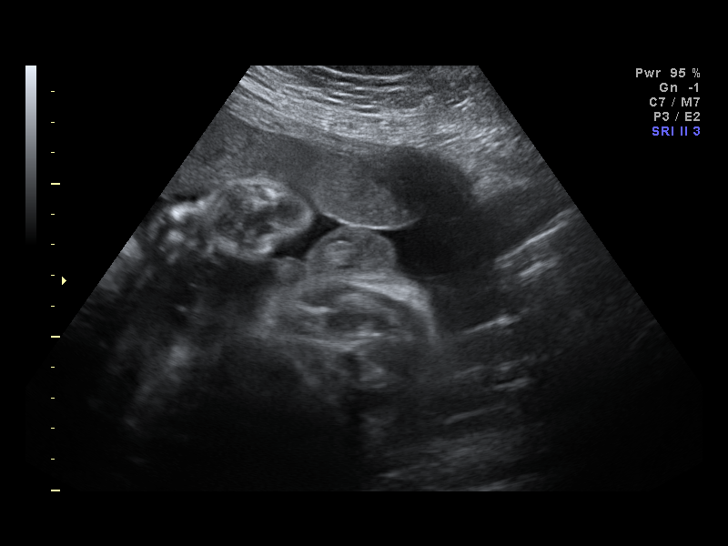
[im 37/40]
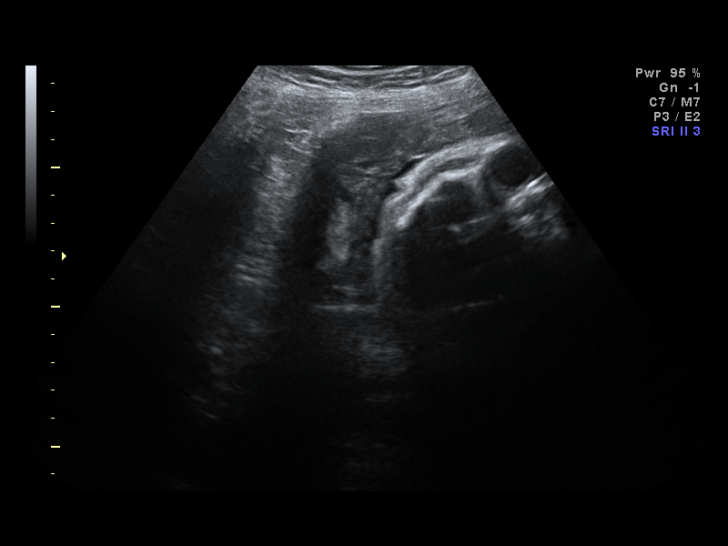
[im 40/40]
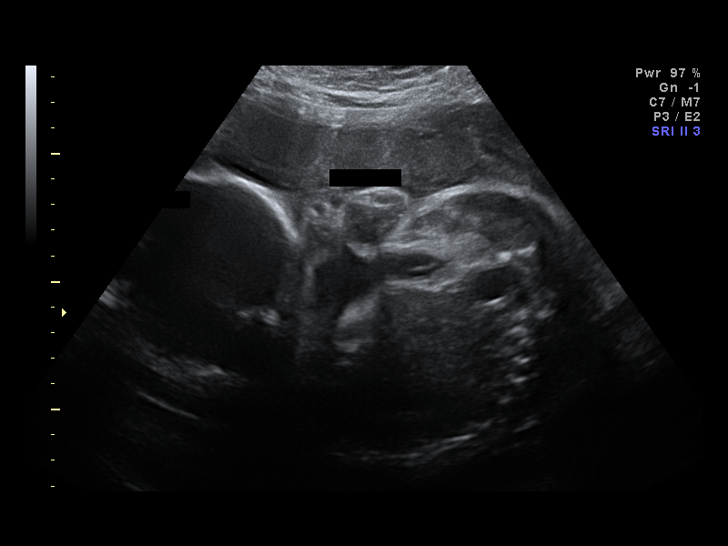

[14 of 28 positions shown; findings below may reference images not displayed]

IMPRESSION: [facility] has also been faxed to the ordering physician.

## 2008-03-04 ENCOUNTER — Inpatient Hospital Stay (HOSPITAL_COMMUNITY): Admission: AD | Admit: 2008-03-04 | Discharge: 2008-03-11 | Payer: Self-pay | Admitting: Family Medicine

## 2008-03-04 ENCOUNTER — Encounter: Payer: Self-pay | Admitting: Obstetrics & Gynecology

## 2008-03-04 ENCOUNTER — Ambulatory Visit: Payer: Self-pay | Admitting: Family Medicine

## 2008-03-04 IMAGING — US US OB FOLLOW-UP
1 series · 14 of 26 positions shown · non-contrast
Comparison: none

OBSTETRICAL ULTRASOUND:
 This ultrasound was performed in The [[facility]OS[facility]], and the [facility] report will be stored to [REDACTED] PACS.

[Series 1: us ob follow-up · 14 of 26 slices shown]
[im 1/26]
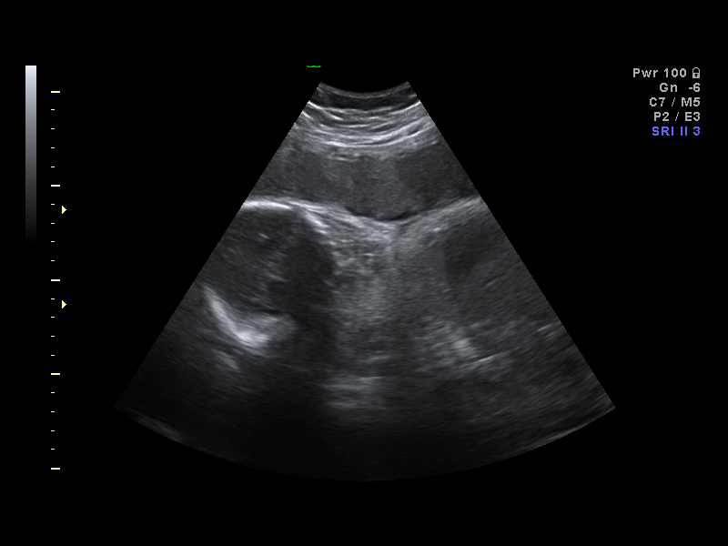
[im 3/26]
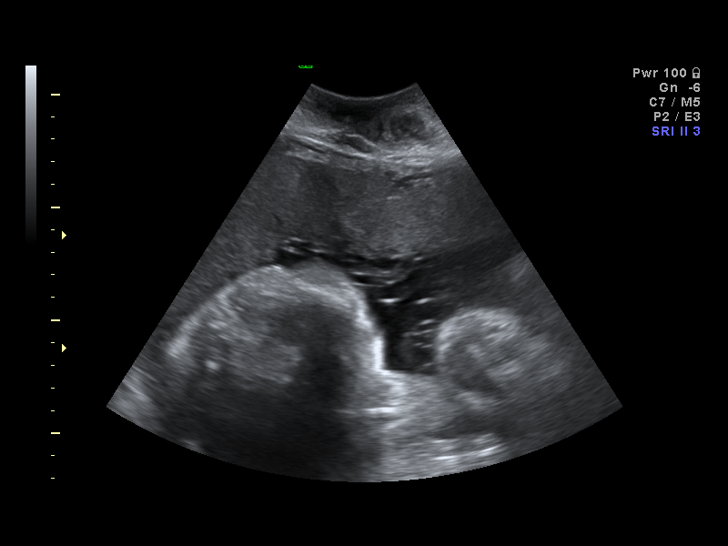
[im 5/26]
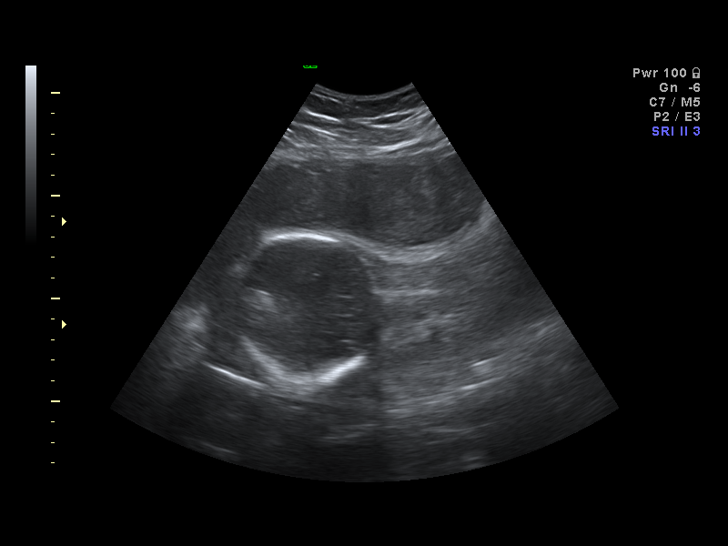
[im 7/26]
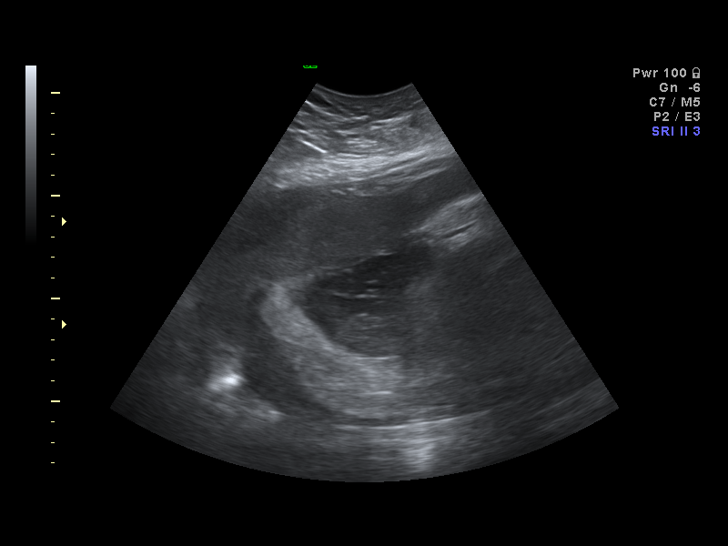
[im 9/26]
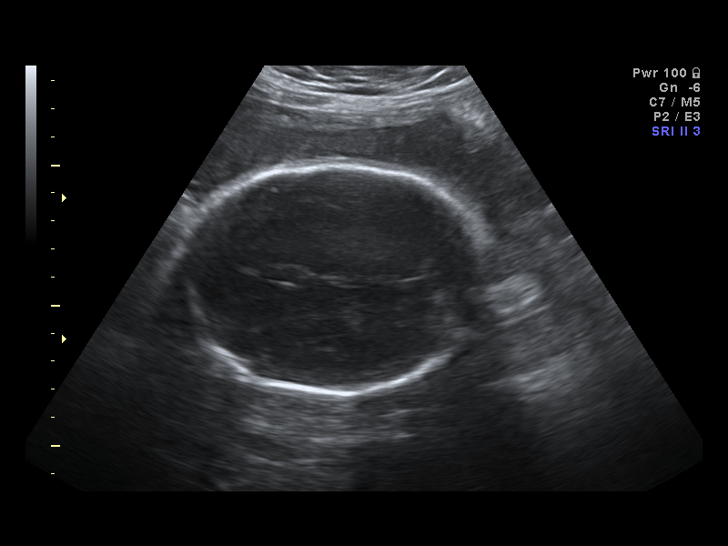
[im 11/26]
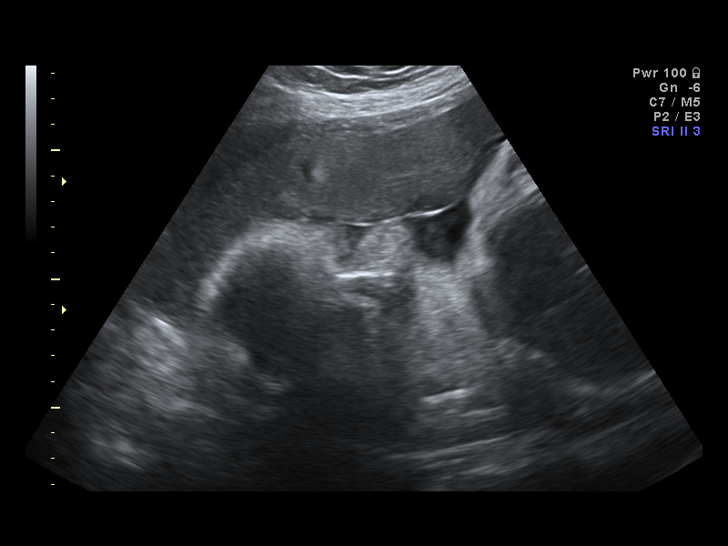
[im 13/26]
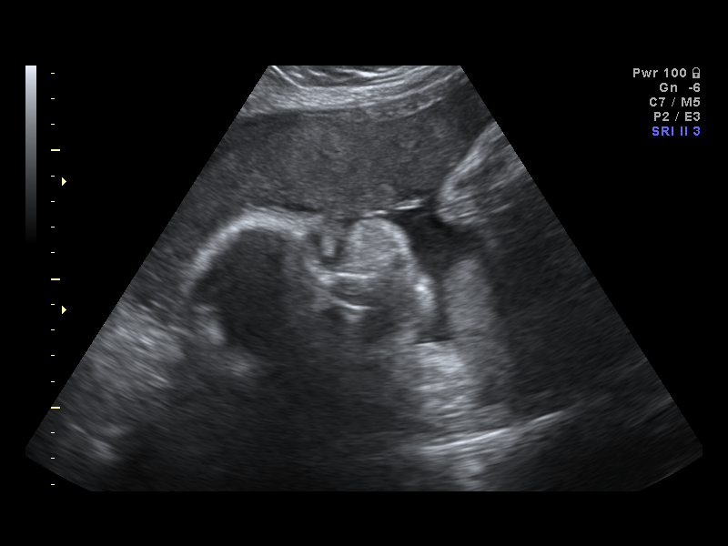
[im 14/26]
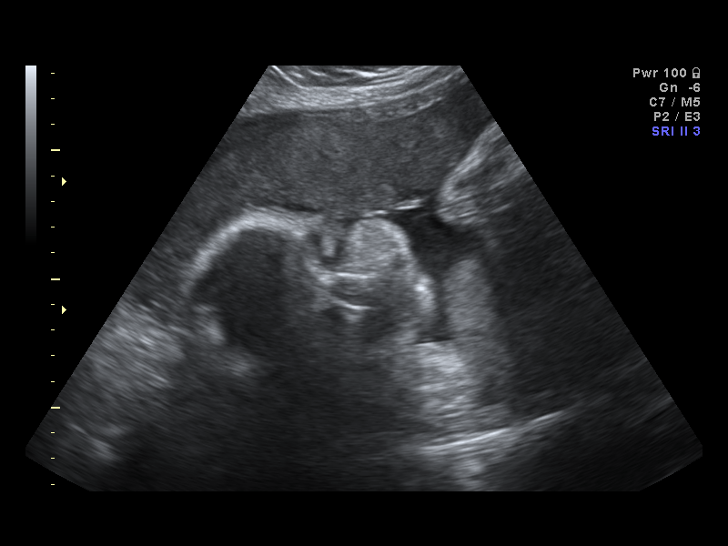
[im 16/26]
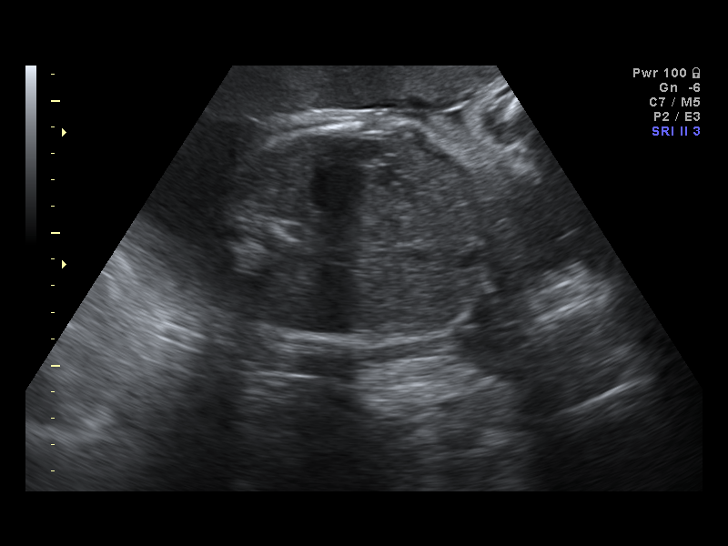
[im 18/26]
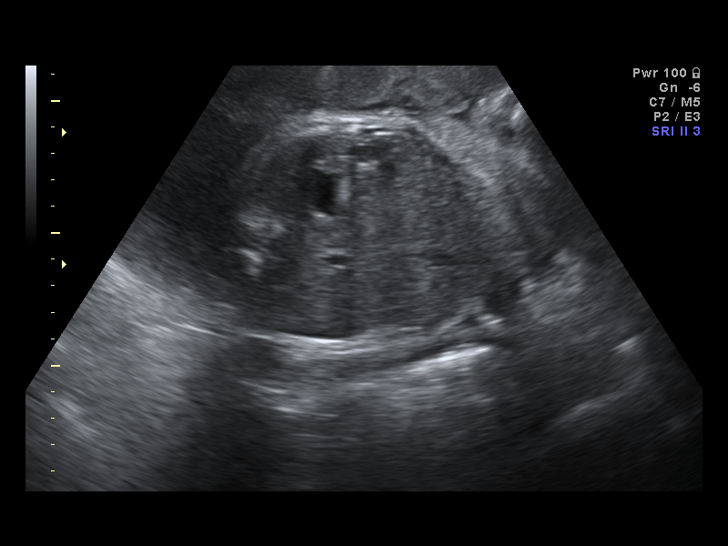
[im 20/26]
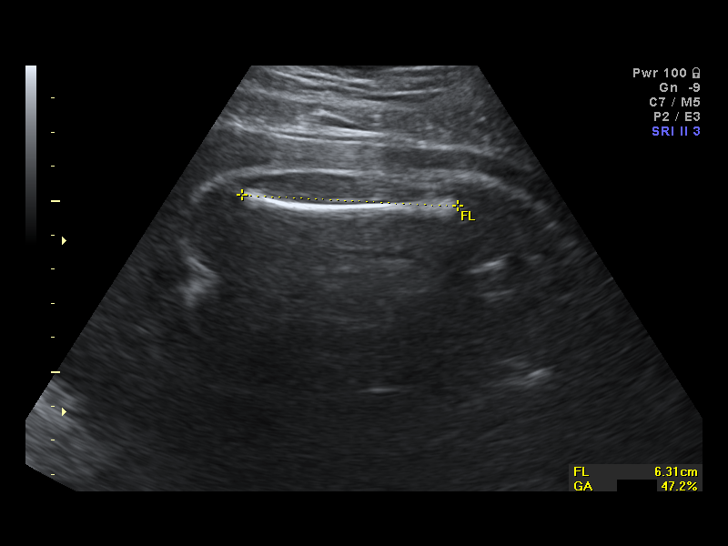
[im 22/26]
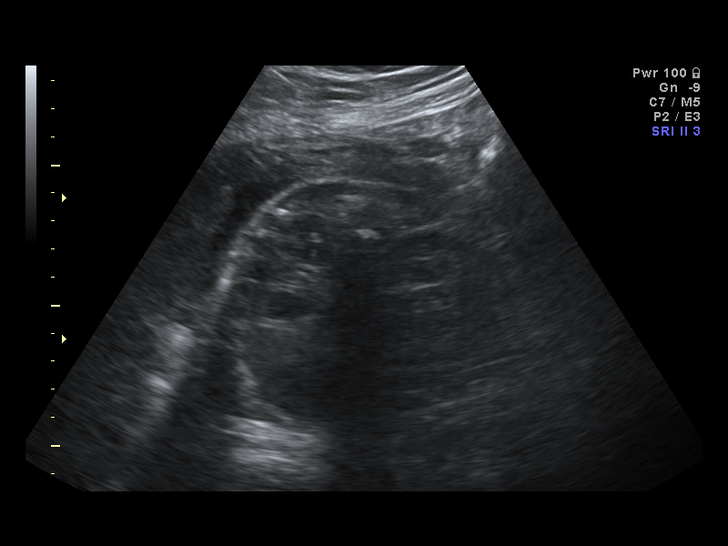
[im 24/26]
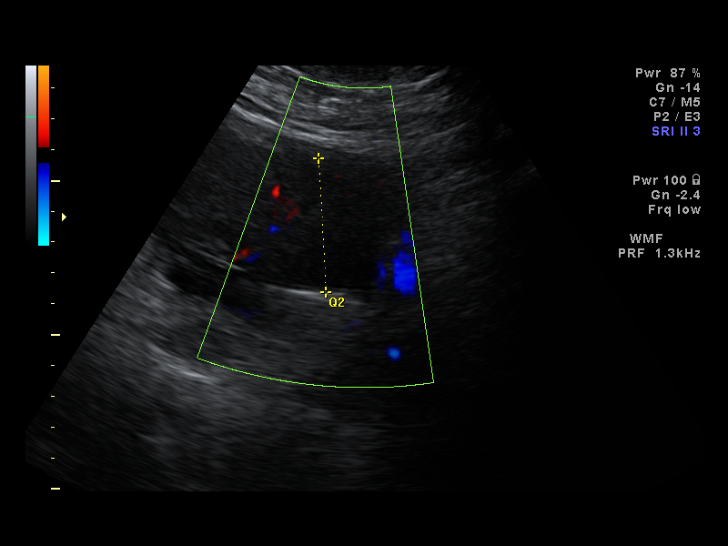
[im 26/26]
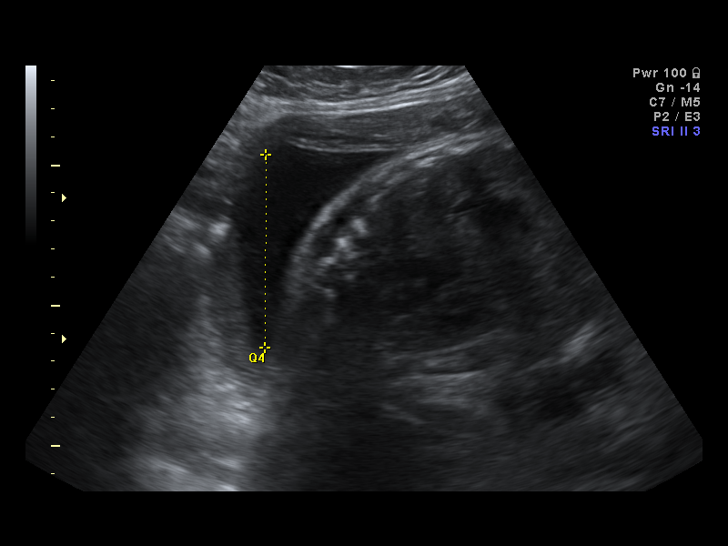

[14 of 26 positions shown; findings below may reference images not displayed]

IMPRESSION: [facility] has also been faxed to the ordering physician.

## 2008-03-17 IMAGING — CR DG CHEST 1V PORT
1 series · 1 of 1 positions shown · non-contrast
Comparison: [DATE]

CLINICAL DATA: Shortness of breath and back pain.

PORTABLE CHEST - 1 VIEW

[view not recorded]
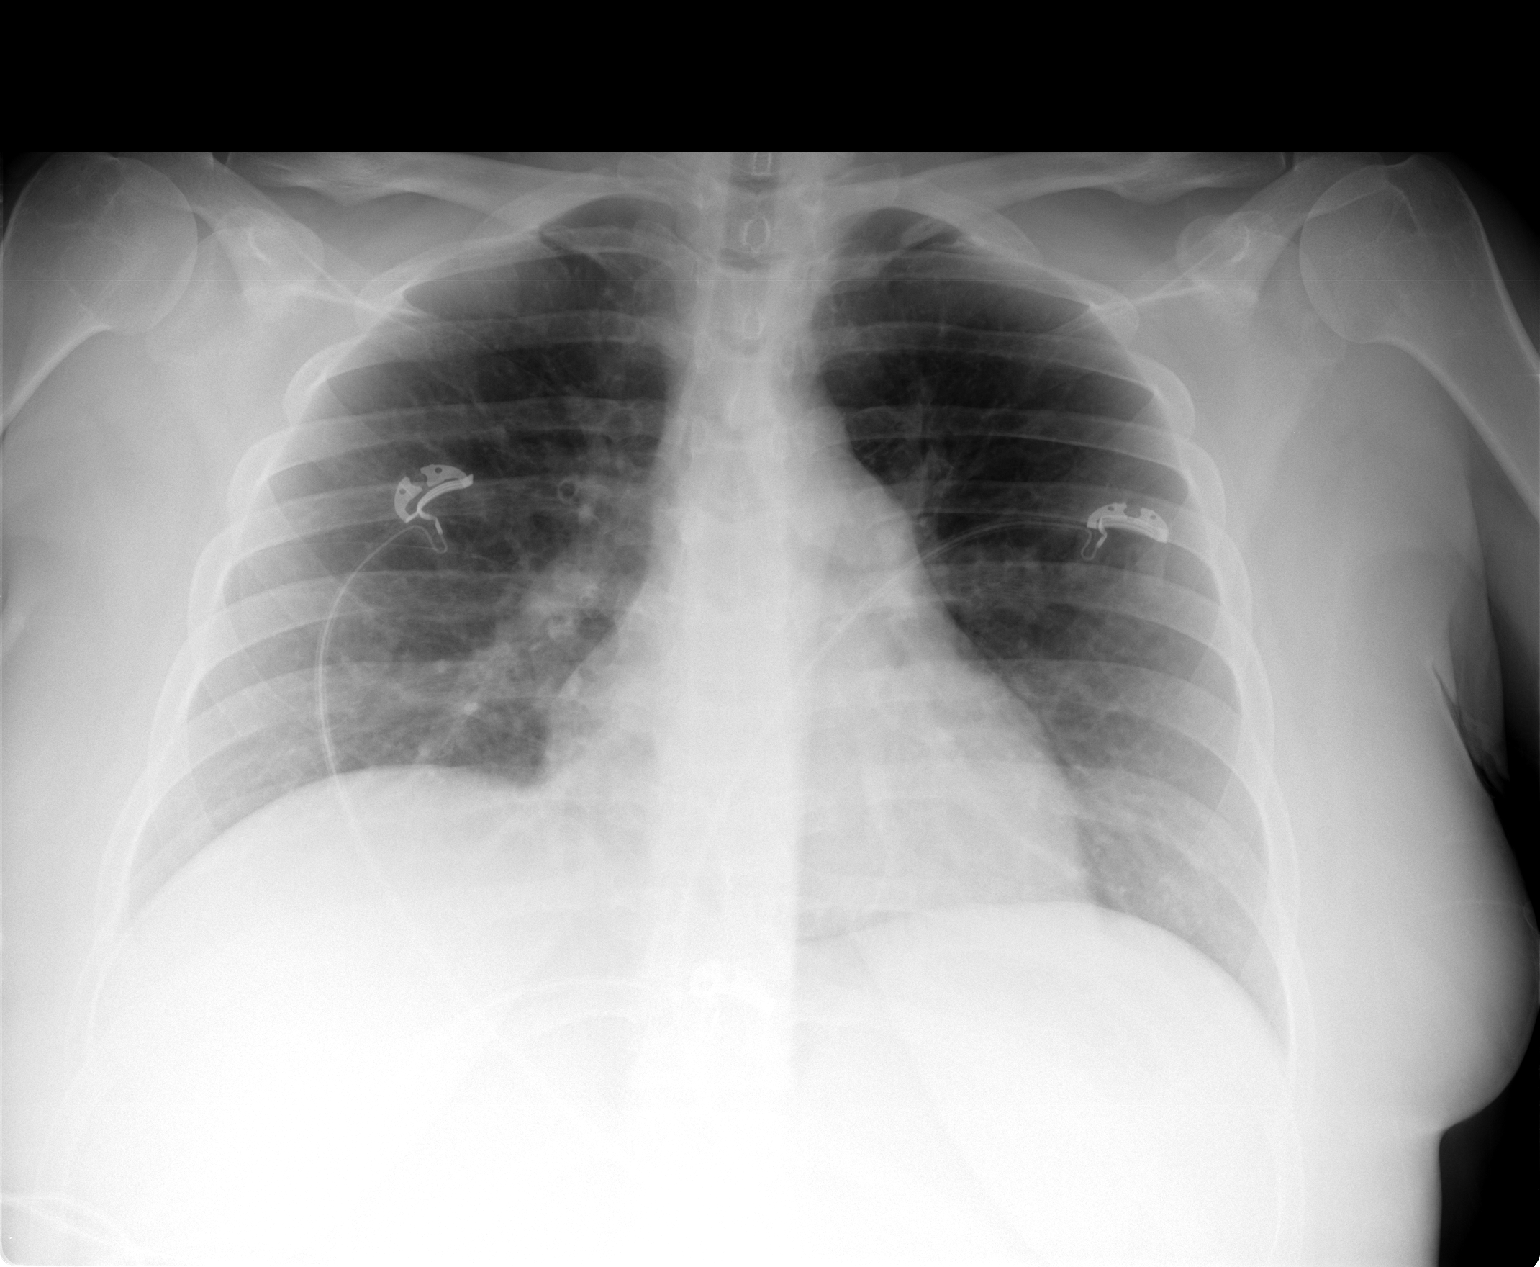

[1 of 1 positions shown; findings below may reference images not displayed]

FINDINGS: The heart size and vascularity are normal and the lungs
are clear except for some peribronchial thickening which is either
chronic or recurrent since the appearance is essentially unchanged
since the prior exam.  No bony abnormality.
IMPRESSION: Bronchitic changes which could be acute or chronic.

## 2008-04-02 ENCOUNTER — Ambulatory Visit (HOSPITAL_COMMUNITY): Admission: RE | Admit: 2008-04-02 | Discharge: 2008-04-02 | Payer: Self-pay | Admitting: Obstetrics & Gynecology

## 2008-04-02 IMAGING — US US OB FOLLOW-UP
1 series · 14 of 28 positions shown · non-contrast
Comparison: none

OBSTETRICAL ULTRASOUND:
 This ultrasound was performed in The [[facility]], and the [facility] report will be stored to [REDACTED] PACS.

[Series 1: us ob follow-up · 14 of 30 slices shown]
[im 2/30]
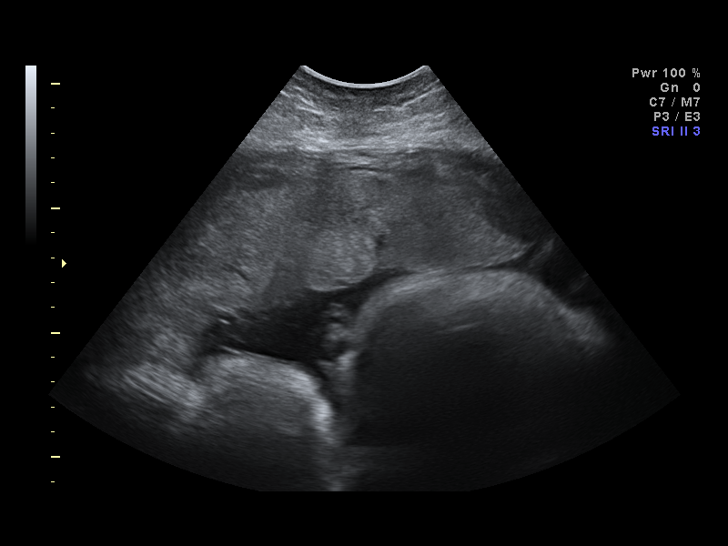
[im 4/30]
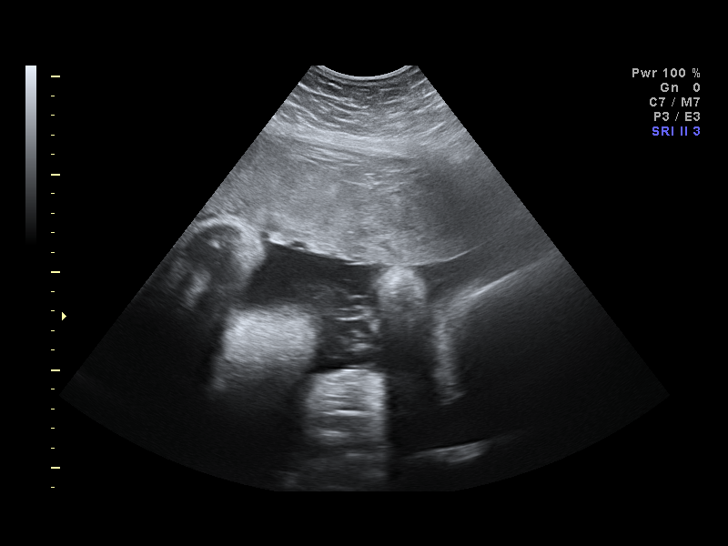
[im 6/30]
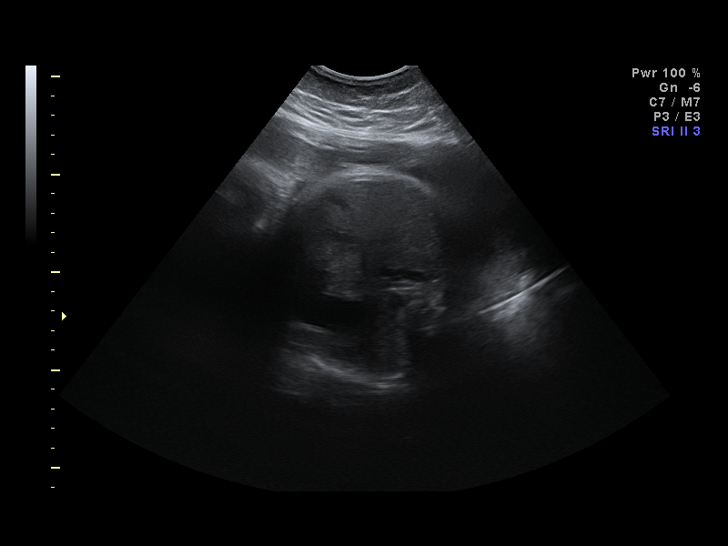
[im 8/30]
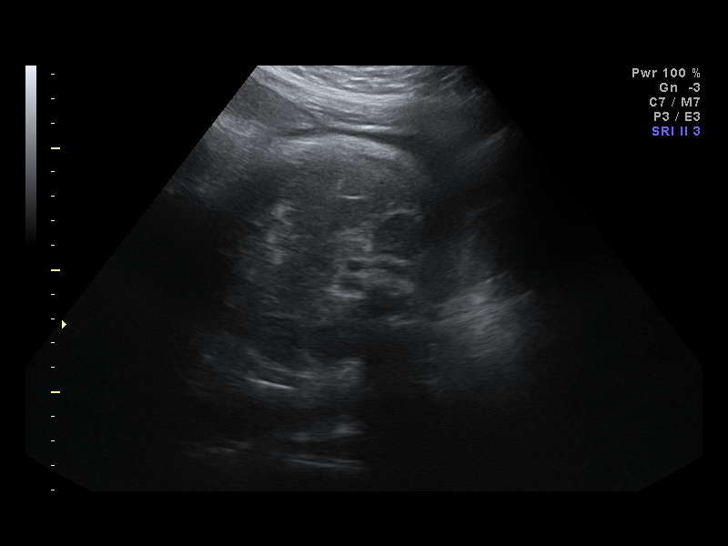
[im 10/30]
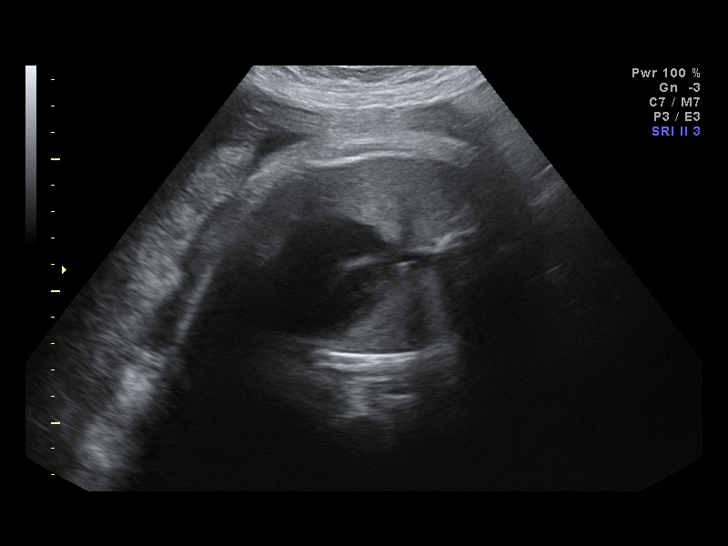
[im 12/30]
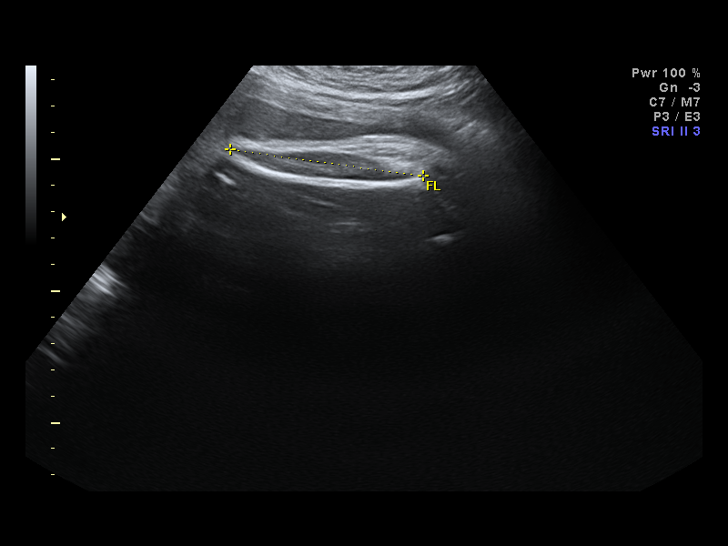
[im 14/30]
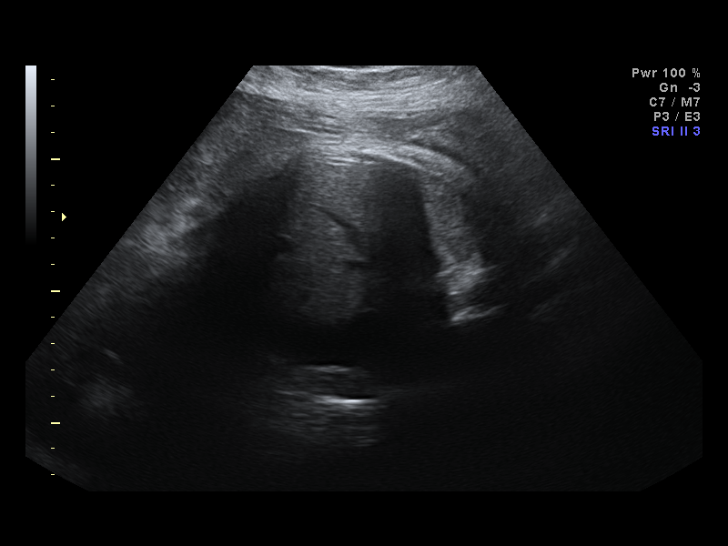
[im 17/30]
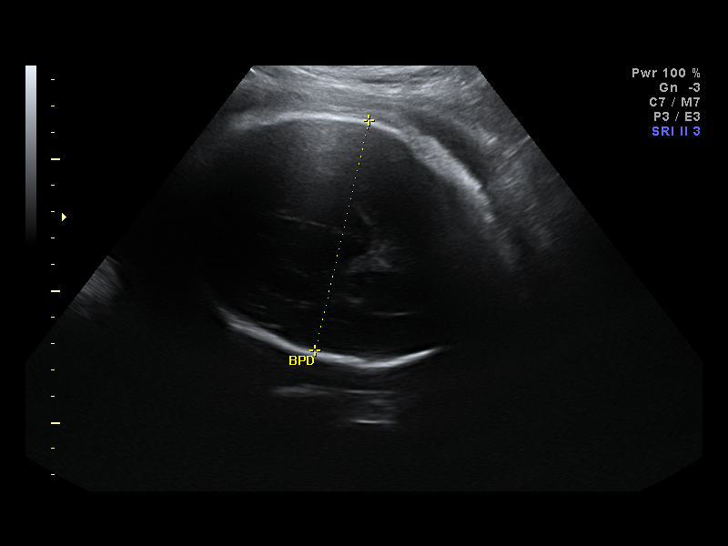
[im 19/30]
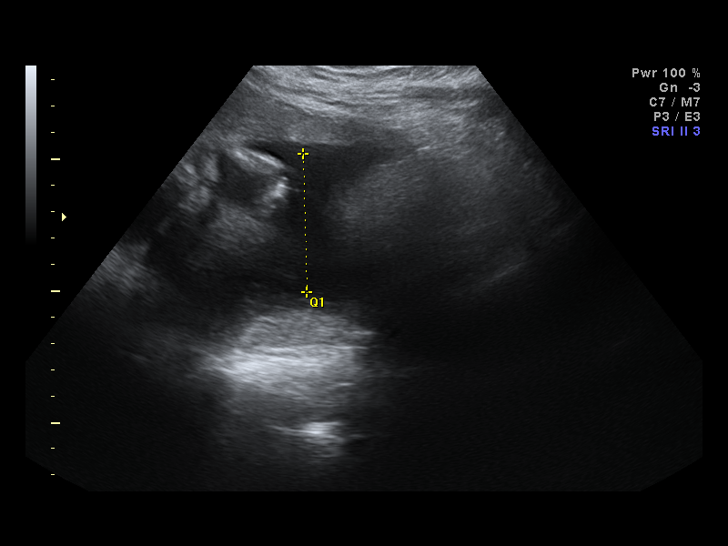
[im 21/30]
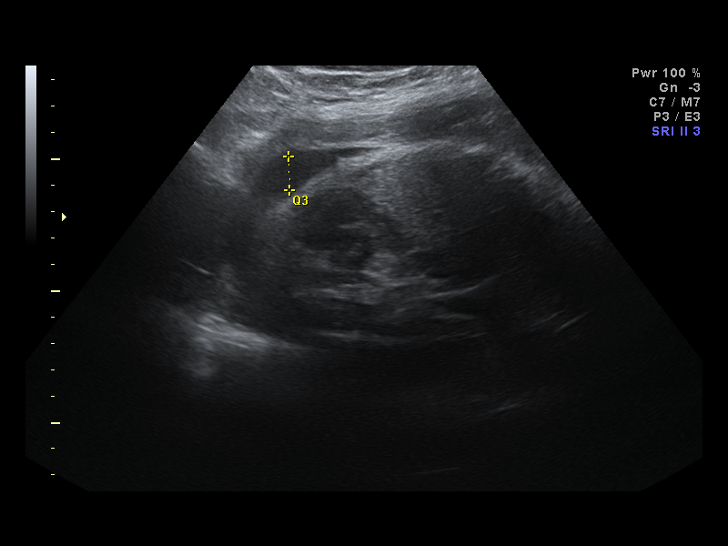
[im 23/30]
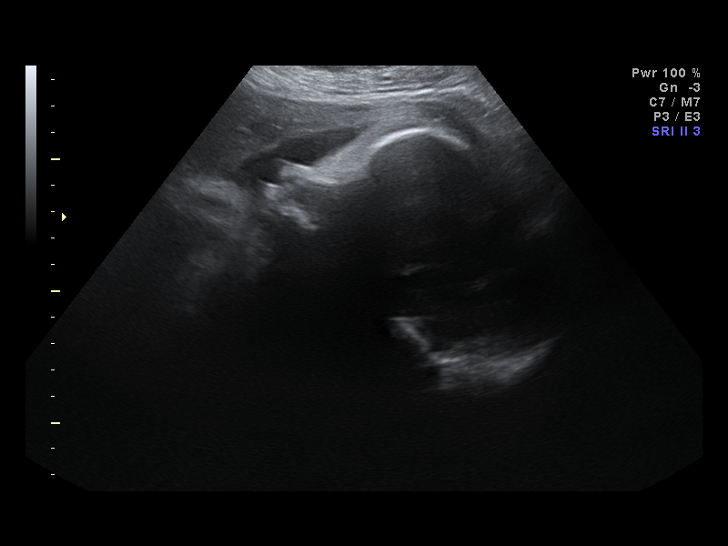
[im 25/30]
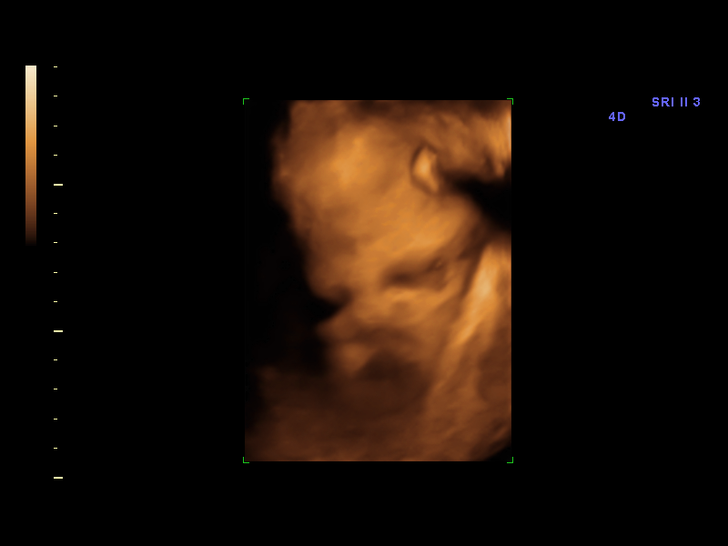
[im 27/30]
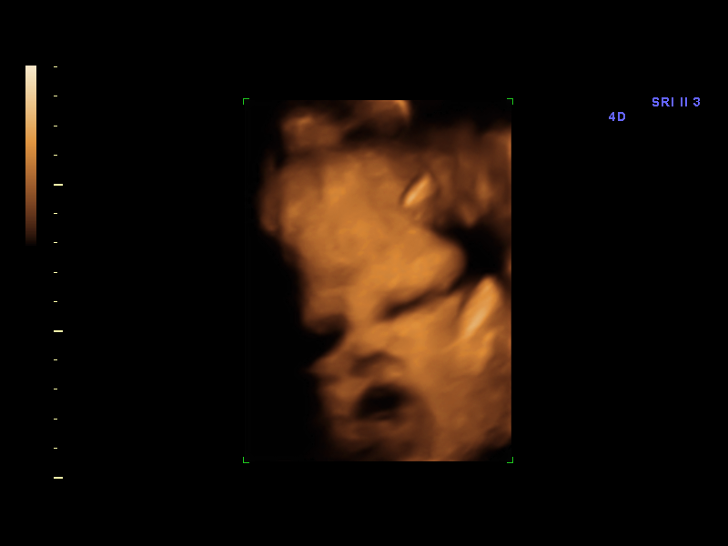
[im 30/30]
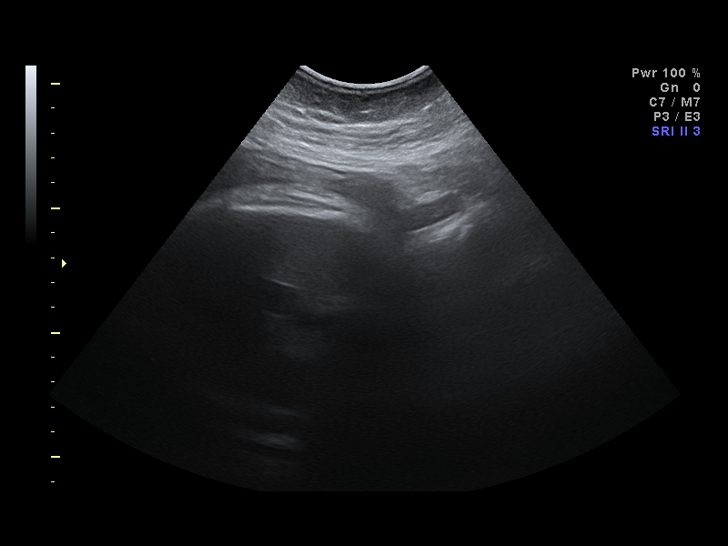

[14 of 28 positions shown; findings below may reference images not displayed]

IMPRESSION: [facility] has also been faxed to the ordering physician.

## 2008-04-05 ENCOUNTER — Inpatient Hospital Stay (HOSPITAL_COMMUNITY): Admission: AD | Admit: 2008-04-05 | Discharge: 2008-04-08 | Payer: Self-pay | Admitting: Obstetrics & Gynecology

## 2008-04-06 ENCOUNTER — Ambulatory Visit: Payer: Self-pay | Admitting: Advanced Practice Midwife

## 2008-05-13 ENCOUNTER — Inpatient Hospital Stay (HOSPITAL_COMMUNITY): Admission: EM | Admit: 2008-05-13 | Discharge: 2008-05-20 | Payer: Self-pay | Admitting: Emergency Medicine

## 2008-05-14 IMAGING — CT CT ABDOMEN W/O CM
1 of 2 series · 15 of 32 positions shown, 19 images · non-contrast
Comparison: None

CLINICAL DATA: Right side back pain, recent pregnancy and
childbirth

CT ABDOMEN WITHOUT CONTRAST
TECHNIQUE: Multidetector CT imaging of the abdomen was performed
following the standard protocol without IV contrast. Oral contrast
was administered but IV contrast could not be utilized due to renal
dysfunction. Sagittal and coronal MPR images reconstructed from
axial data set.

[Series 2: abd w/o 5.0 b40f · axial · non-contrast · 0.74mm/px · z∈[-345,-35]mm · 15 of 68 slices shown, 19 images]
[im 3/68  soft-tissue]
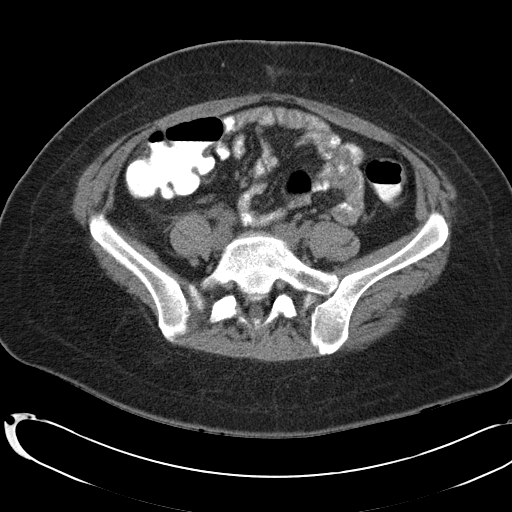
[im 3/68  bone]
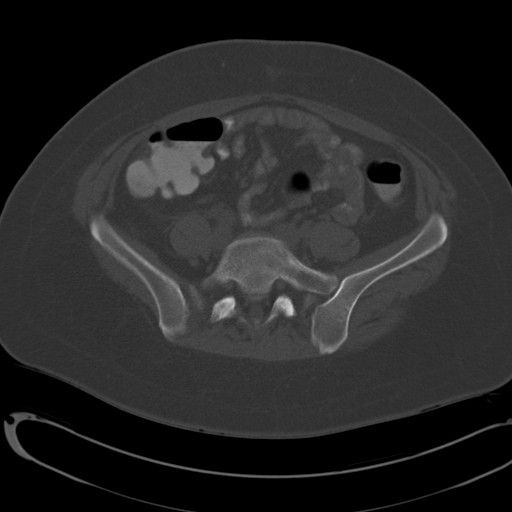
[im 9/68  soft-tissue]
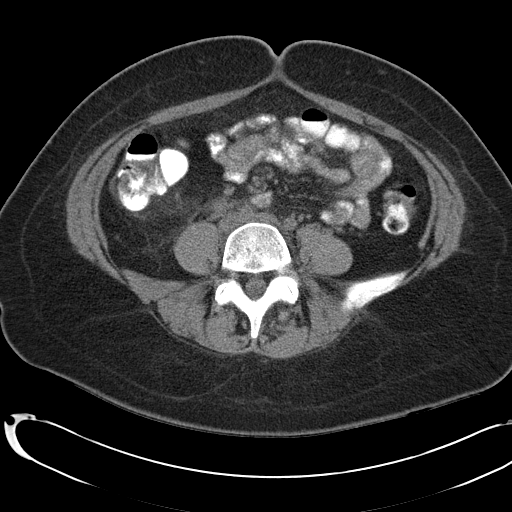
[im 15/68  soft-tissue]
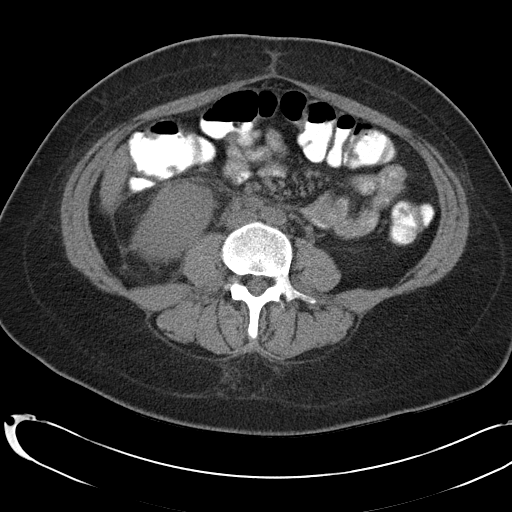
[im 18/68  soft-tissue]
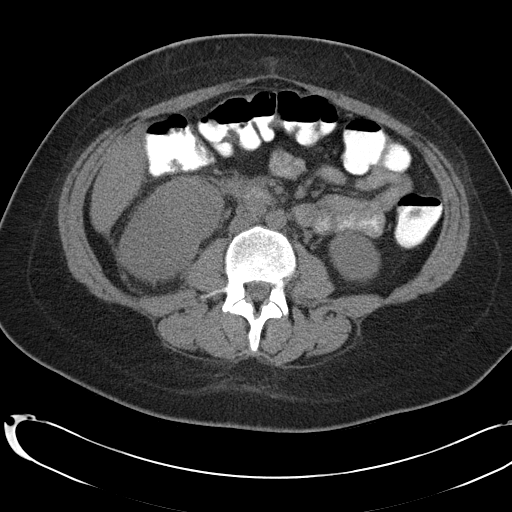
[im 24/68  soft-tissue]
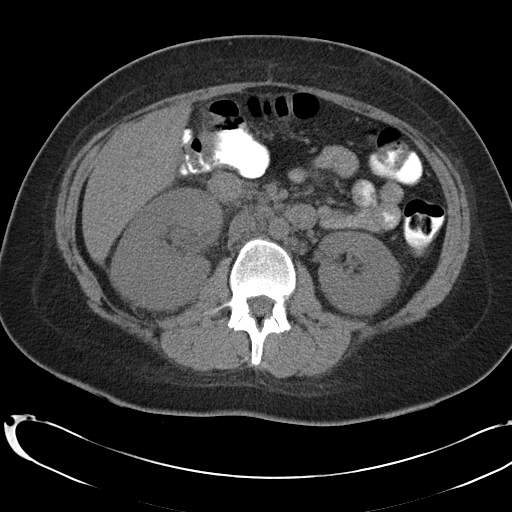
[im 30/68  soft-tissue]
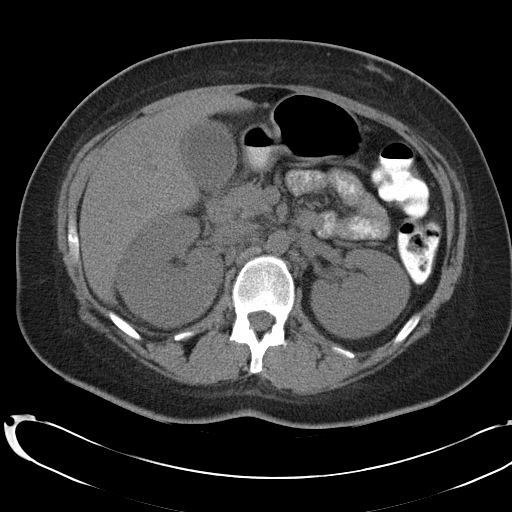
[im 35/68  soft-tissue]
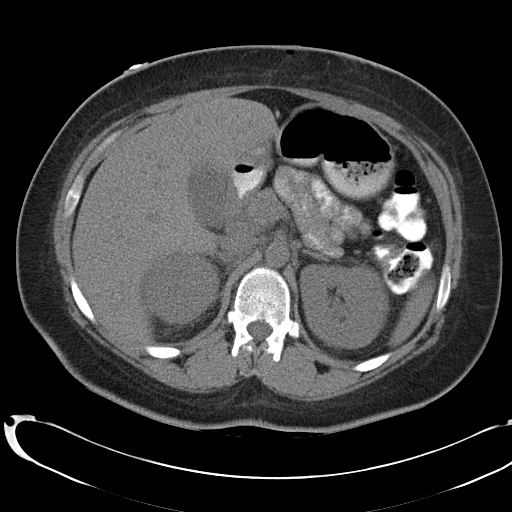
[im 38/68  soft-tissue]
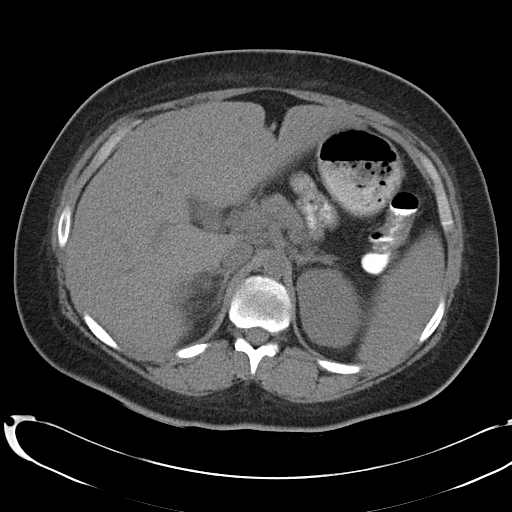
[im 44/68  soft-tissue]
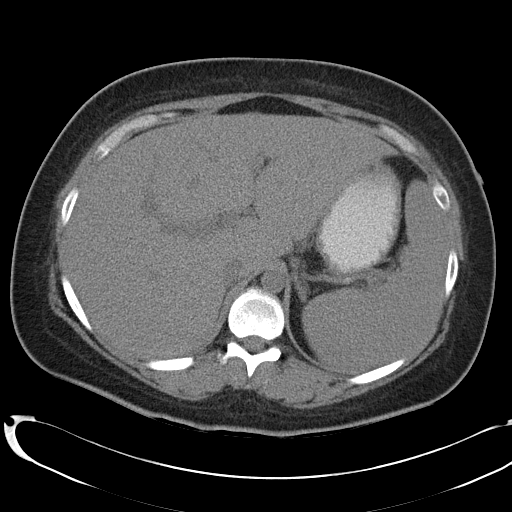
[im 44/68  bone]
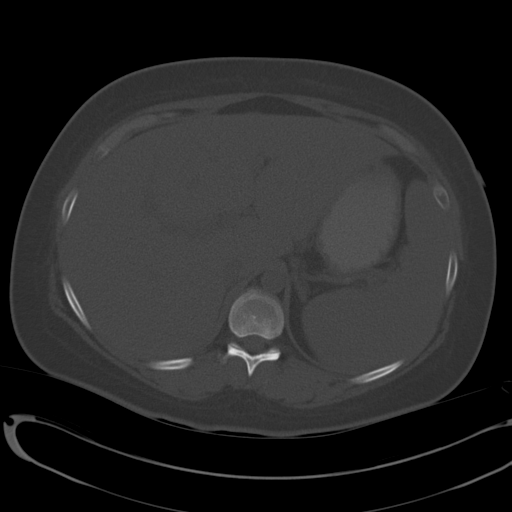
[im 50/68  soft-tissue]
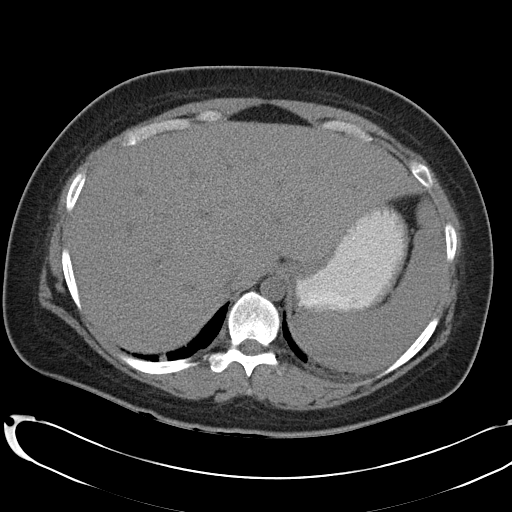
[im 53/68  soft-tissue]
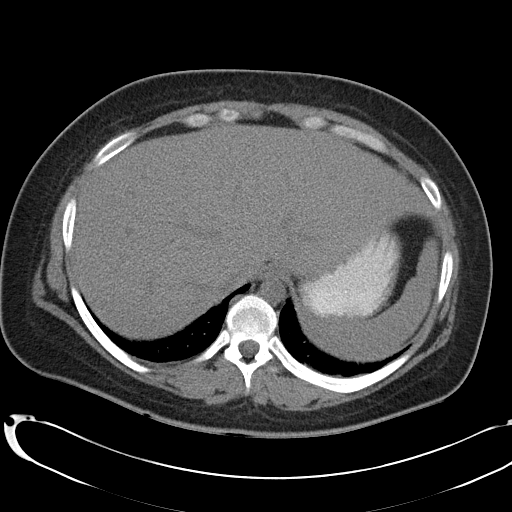
[im 56/68  lung]
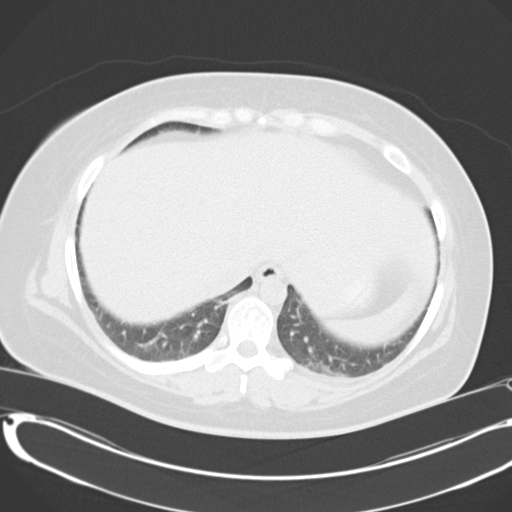
[im 59/68  soft-tissue]
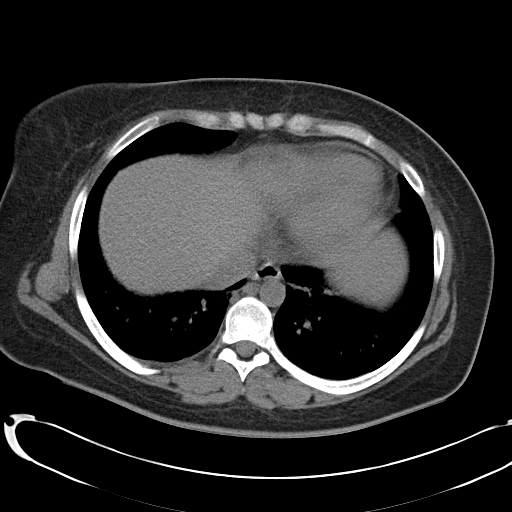
[im 59/68  lung]
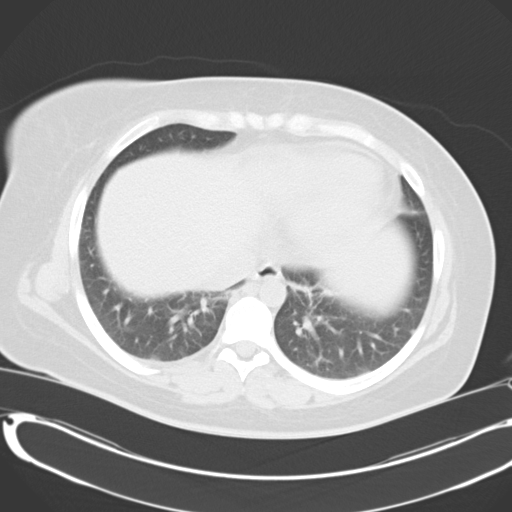
[im 62/68  lung]
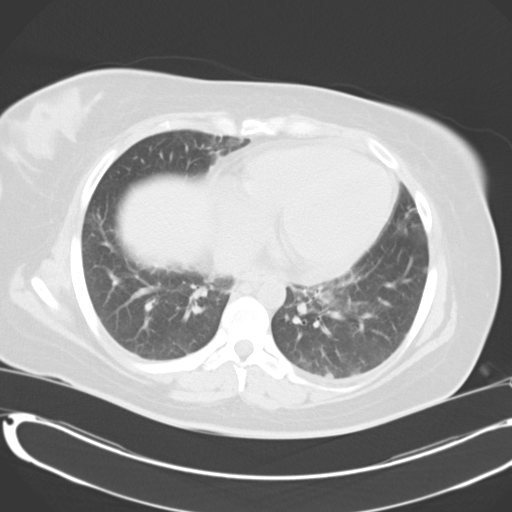
[im 65/68  soft-tissue]
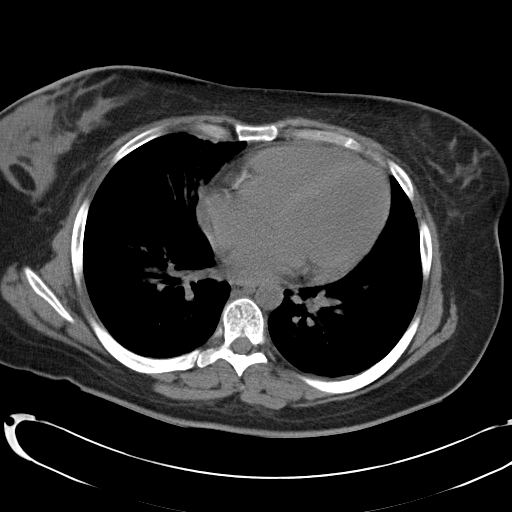
[im 65/68  lung]
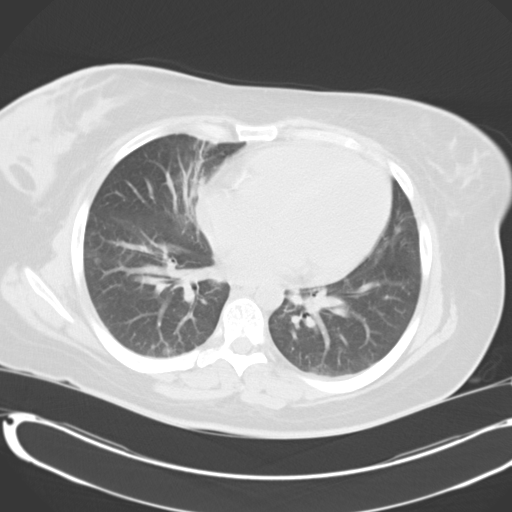

[15 of 32 positions shown; findings below may reference images not displayed]

FINDINGS: Bibasilar atelectasis, cannot exclude minimal basilar infiltrates.
Multiple tiny nodular foci noted at lung bases, nonspecific
appearance.
Liver, spleen, pancreas, and adrenal glands normal.
Gallbladder distended with dependent calculi and questionable wall
thickening.
Early acute cholecystitis not excluded.
Bilateral renal enlargement, right greater than left, with mild
right hydronephrosis and perinephric edema.
No definite proximal ureteral or renal calculus identified.
Proximal right ureter is minimally prominent size extending into
pelvis, pelvis not imaged on this study.
Large and small bowel loops in upper abdomen unremarkable.
Normal appearing stomach.
Normal-sized lymph nodes at the celiac axis with multiple upper
normal retroperitoneal nodes.
Single minimally enlarged aortocaval node, 15 x 14 mm image 43.
No additional mass, free fluid, or hernia.
Minimal pericardial fluid.
IMPRESSION: Nonspecific bibasilar pulmonary nodular foci, uncertain etiology.
These could be result of an inflammatory, infectious, or even
neoplastic process.
Follow-up dedicated CT chest recommended to assess entirety of
thorax.
Cholelithiasis with questionable gallbladder wall thickening,
cannot exclude early acute cholecystitis, consider follow-up
ultrasound.
Bilateral renal enlargement with asymmetric enlargement and
perinephric edema at right kidney, questionably associated with
mild proximal ureteral dilatation.
Nonspecific mildly enlarged retroperitoneal lymph nodes.

## 2008-05-24 ENCOUNTER — Ambulatory Visit (HOSPITAL_COMMUNITY): Payer: Self-pay | Admitting: Family Medicine

## 2008-05-24 ENCOUNTER — Encounter (HOSPITAL_COMMUNITY): Admission: RE | Admit: 2008-05-24 | Discharge: 2008-06-23 | Payer: Self-pay | Admitting: Family Medicine

## 2008-05-27 ENCOUNTER — Ambulatory Visit (HOSPITAL_COMMUNITY): Payer: Self-pay | Admitting: Family Medicine

## 2008-06-05 ENCOUNTER — Ambulatory Visit (HOSPITAL_COMMUNITY): Admission: RE | Admit: 2008-06-05 | Discharge: 2008-06-05 | Payer: Self-pay | Admitting: Family Medicine

## 2008-06-05 IMAGING — CT CT CHEST W/ CM
2 of 4 series · 15 of 36 positions shown, 18 images · IV contrast (agent unspecified)
Comparison: CT abdomen [DATE]

CLINICAL DATA: Abnormal CT abdomen with bibasilar lung nodules

CT CHEST WITH CONTRAST
TECHNIQUE: Multidetector CT imaging of the chest was performed
following the standard protocol during bolus administration of
intravenous contrast.Sagittal and coronal MPR images reconstructed
from axial data set.
Contrast: 80 ml [I8]

[Series 2: chestroutine 5.0 b40f · axial · 0.72mm/px · z∈[-334,-110]mm · 12 of 51 slices shown, 15 images]
[im 3/51  mediastinal]
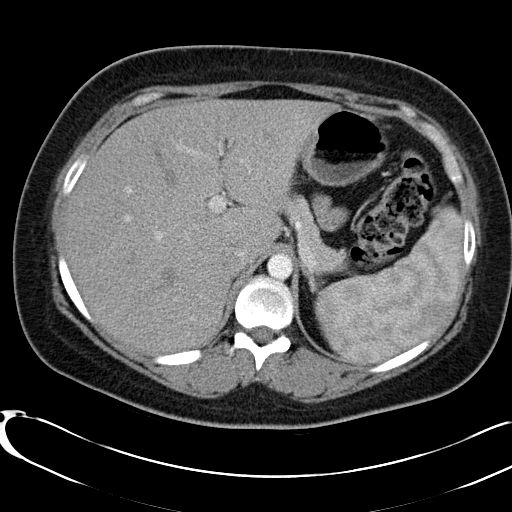
[im 3/51  lung]
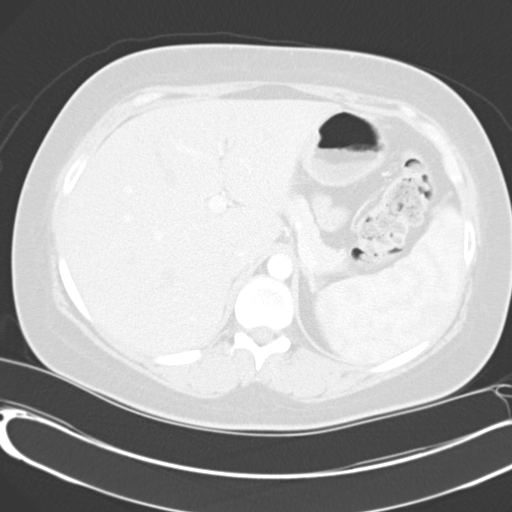
[im 7/51  lung]
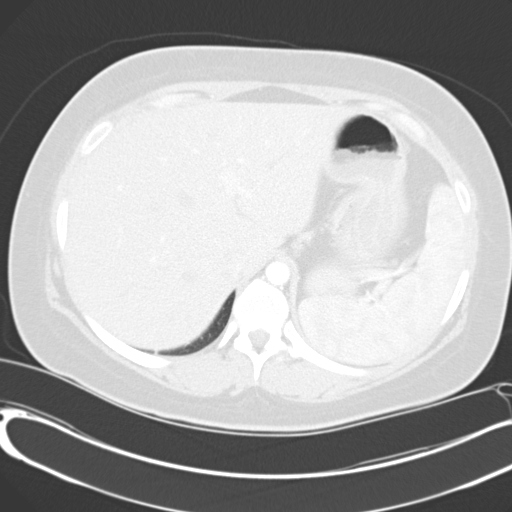
[im 12/51  lung]
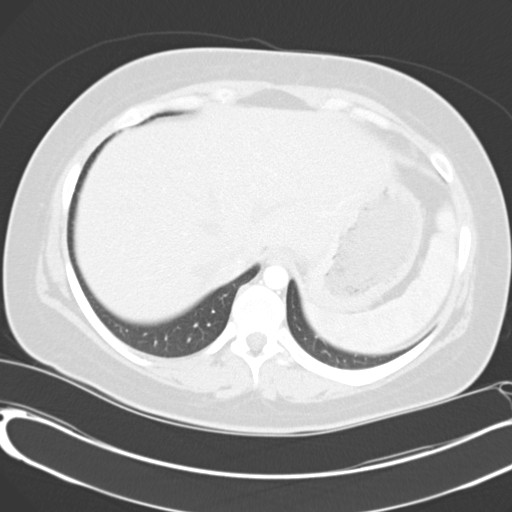
[im 16/51  lung]
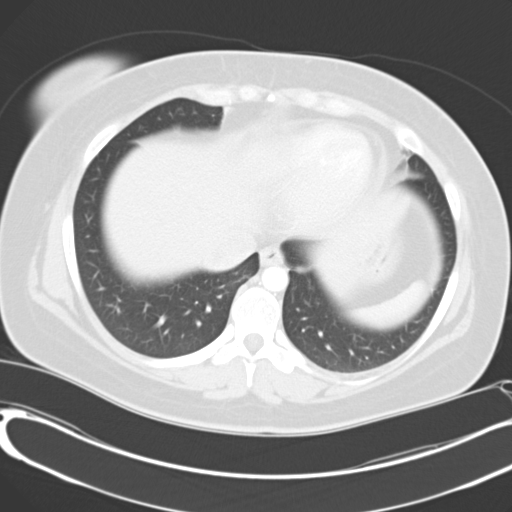
[im 19/51  mediastinal]
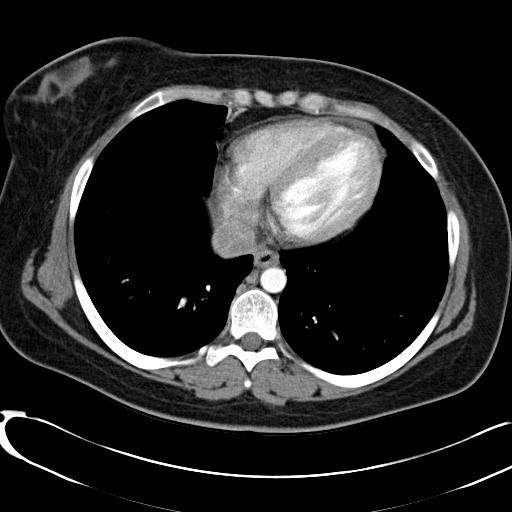
[im 19/51  lung]
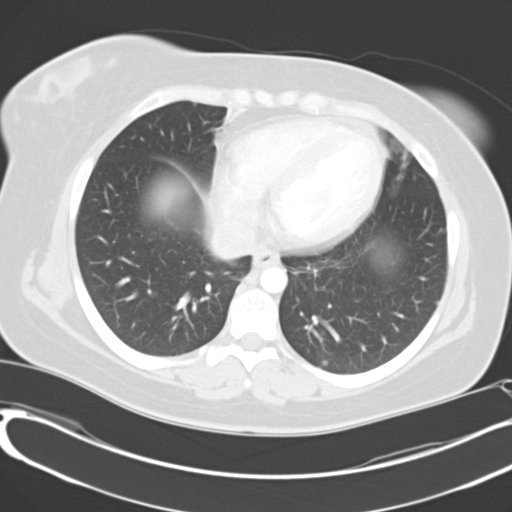
[im 23/51  lung]
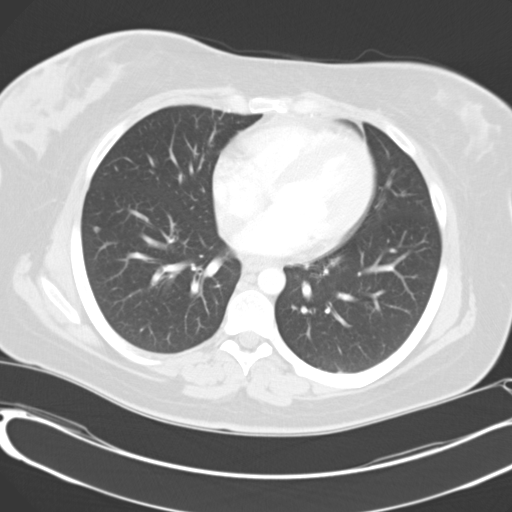
[im 28/51  lung]
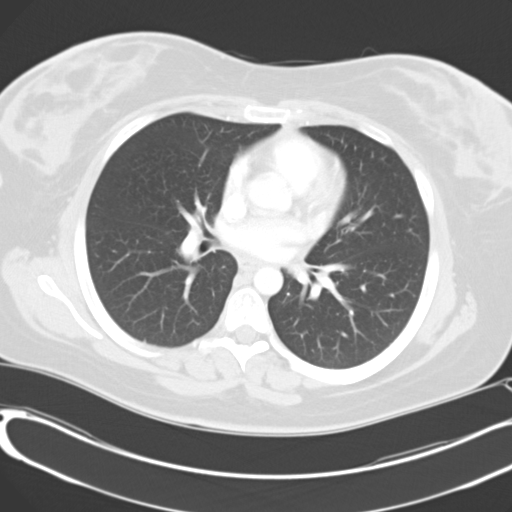
[im 32/51  lung]
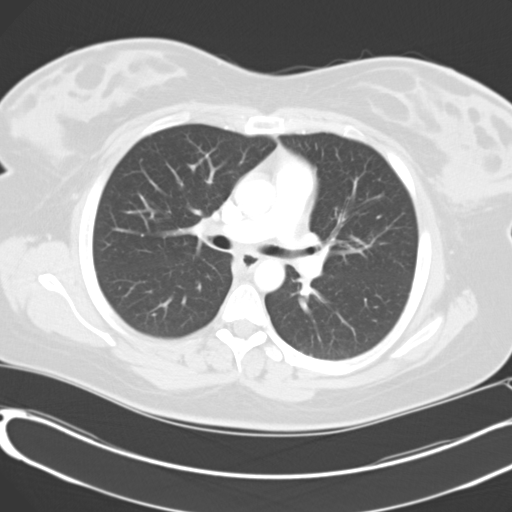
[im 35/51  mediastinal]
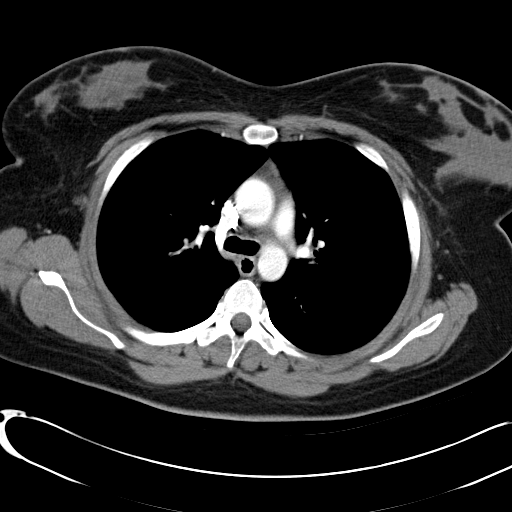
[im 35/51  lung]
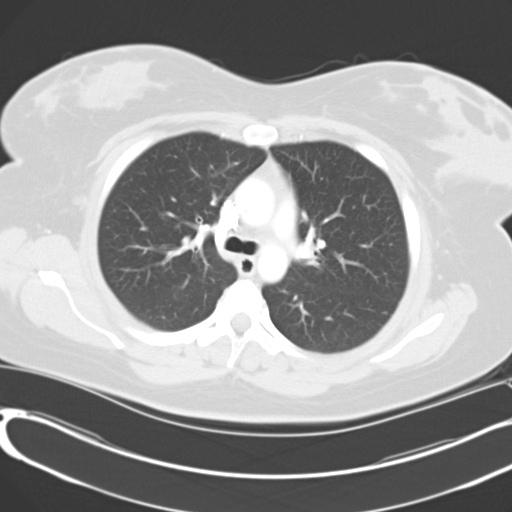
[im 39/51  lung]
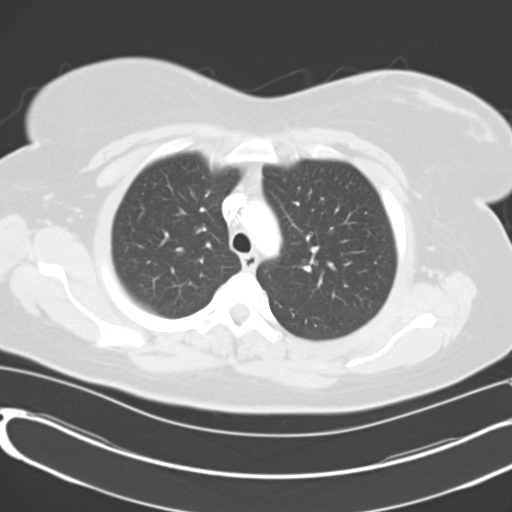
[im 44/51  lung]
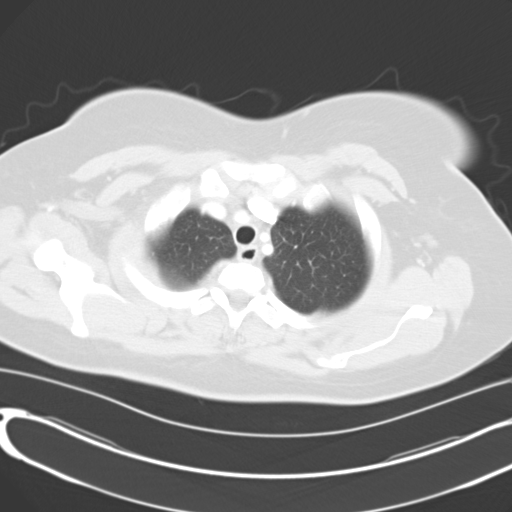
[im 48/51  lung]
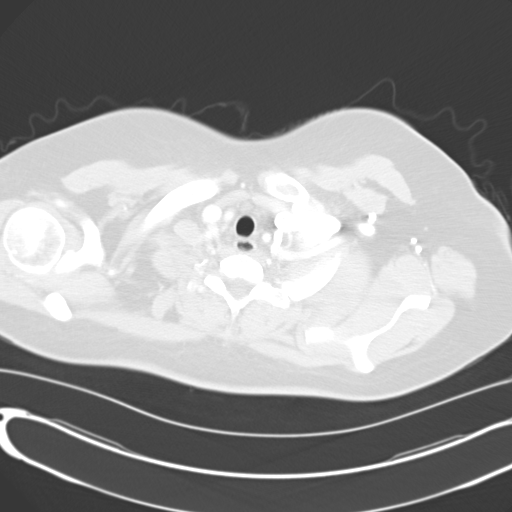

[Series 4: mpr coronal chest · coronal · 0.51mm/px · 3 of 81 slices shown]
[im 17/81  lung]
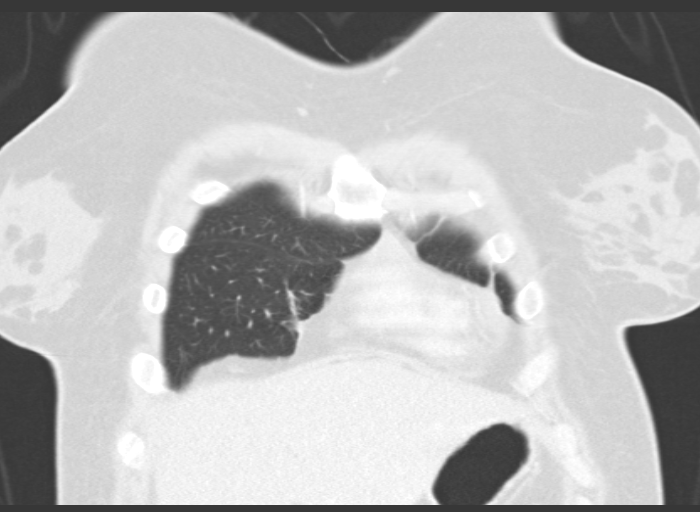
[im 33/81  lung]
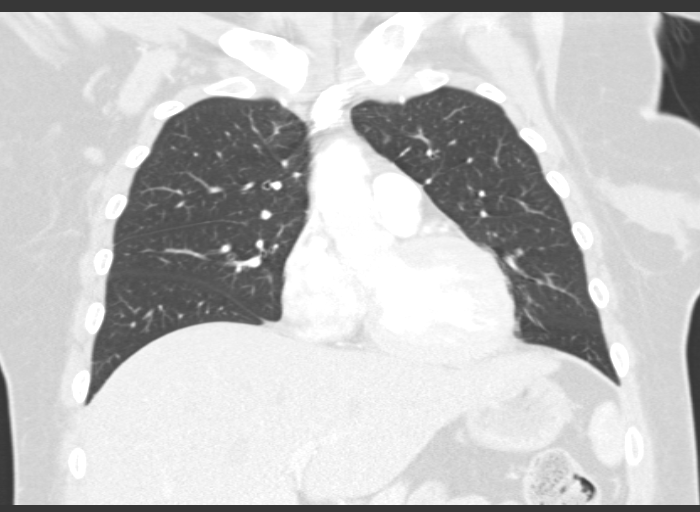
[im 49/81  lung]
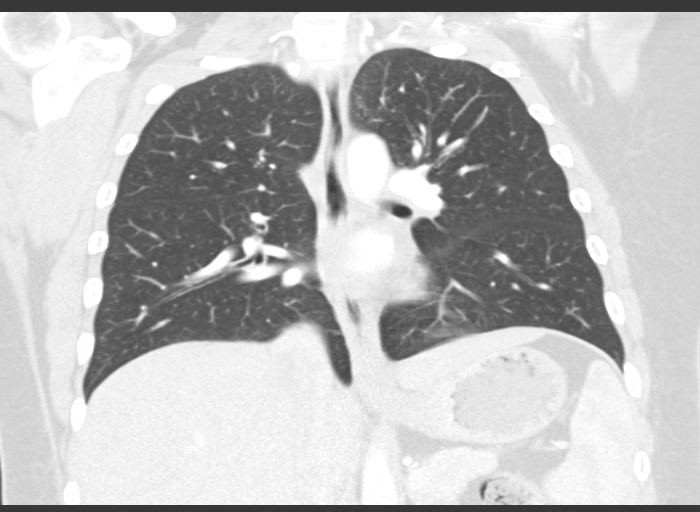

[15 of 36 positions shown; findings below may reference images not displayed]

FINDINGS: Thoracic vascular structures grossly patent on a non dedicated
exam.
Visualized portion of upper abdomen normal.
Minimal pericardial fluid or thickening.
No thoracic adenopathy.
Bones unremarkable.
Multiple small bilateral pulmonary nodules identified, majority 4
mm diameter or less, though a 5 mm left lower lobe nodule is
identified image 32 and 8 mm diameter right lower lobe pulmonary
nodule image 26 is seen.
Observed nodules are noncalcified and nonspecific in appearance.
No dominant mass.
IMPRESSION: Nonspecific noncalcified bilateral pulmonary nodules.
In the absence of a prior history of malignancy, recommendations
below.
If the patient is at high risk for bronchogenic carcinoma, follow-
up chest CT at 3-6 months is recommended.  If the patient is at low
risk for bronchogenic carcinoma, follow-up chest CT at 6-12 months
is recommended.  This recommendation follows the consensus
statement: "Guidelines for Management of Small Pulmonary Nodules
Detected on CT Scans: A Statement from the [HOSPITAL]" as
[URL]

REF:A1 DICTATED: [DATE] [DATE]

## 2008-06-06 ENCOUNTER — Ambulatory Visit (HOSPITAL_COMMUNITY): Admission: RE | Admit: 2008-06-06 | Discharge: 2008-06-06 | Payer: Self-pay | Admitting: Family Medicine

## 2008-06-06 IMAGING — US US ABDOMEN COMPLETE
1 series · 14 of 25 positions shown · non-contrast
Comparison: [DATE]

CLINICAL DATA: Cholelithiasis

ABDOMEN ULTRASOUND
TECHNIQUE: Complete abdominal ultrasound examination was performed
including evaluation of the liver, gallbladder, bile ducts,
pancreas, kidneys, spleen, IVC, and abdominal aorta.

[Series 1: unknown · 0.30mm/px · 14 of 79 slices shown]
[im 1/79]
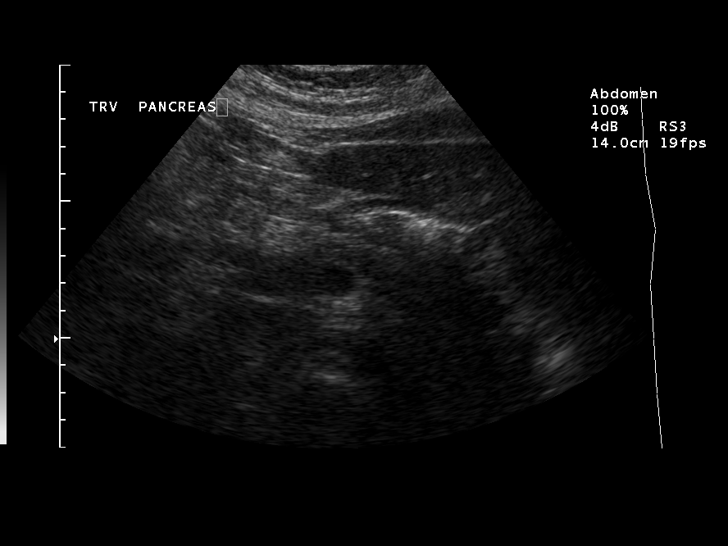
[im 7/79]
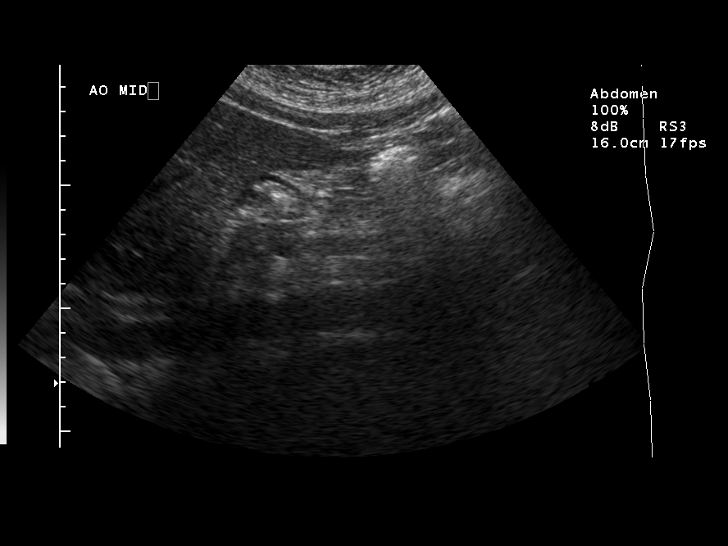
[im 14/79]
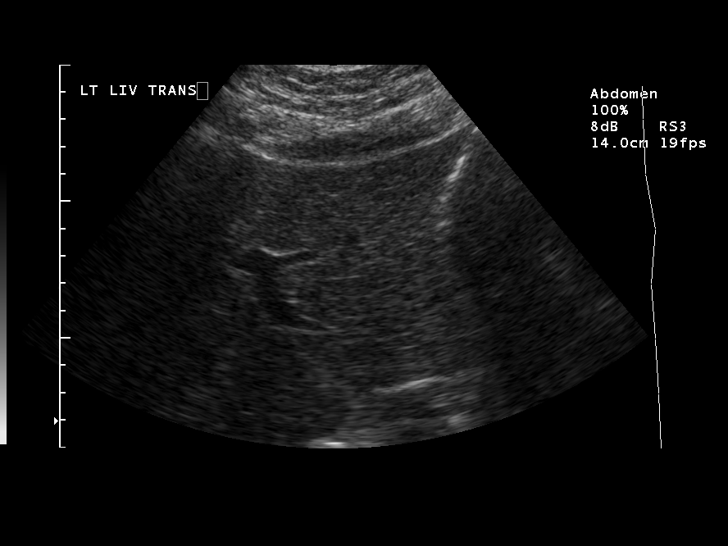
[im 20/79]
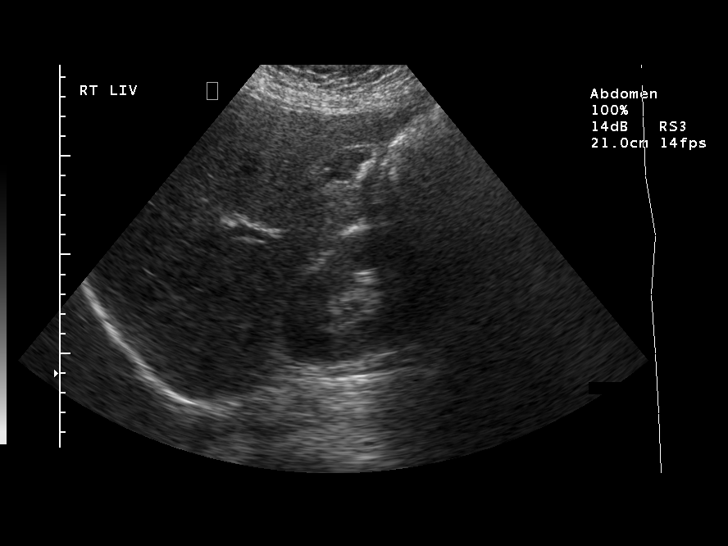
[im 27/79]
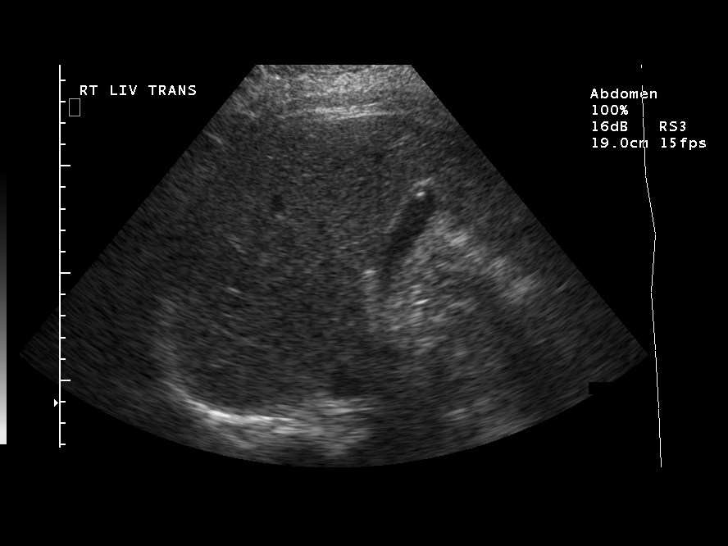
[im 30/79]
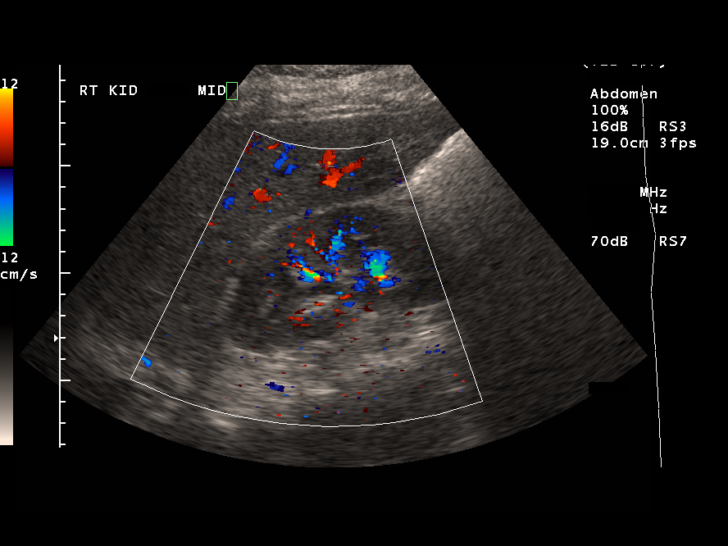
[im 36/79]
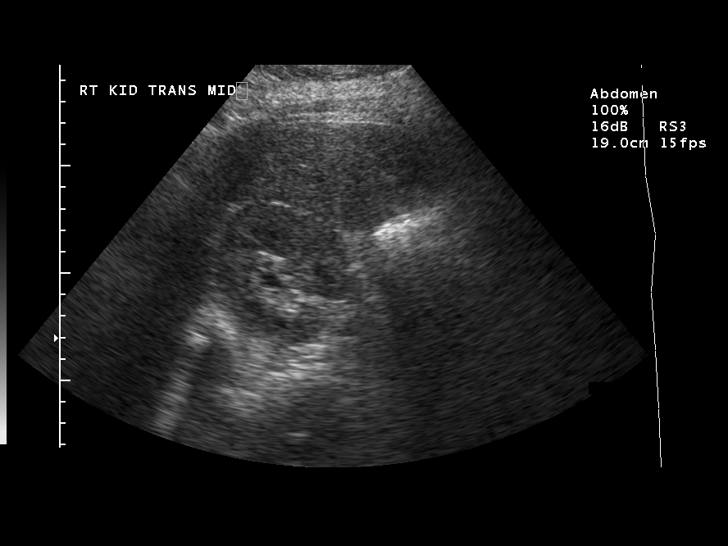
[im 43/79]
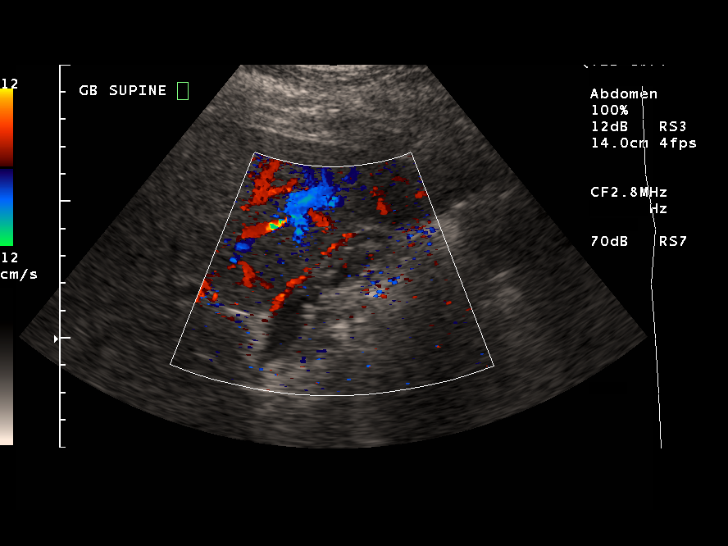
[im 49/79]
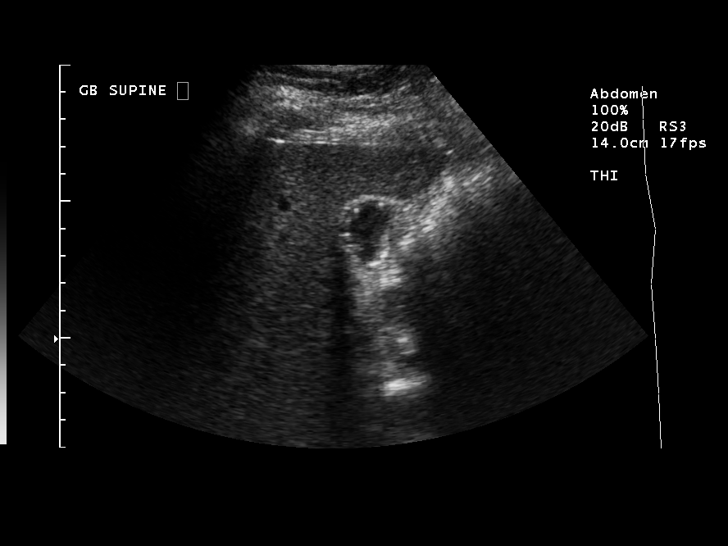
[im 53/79]
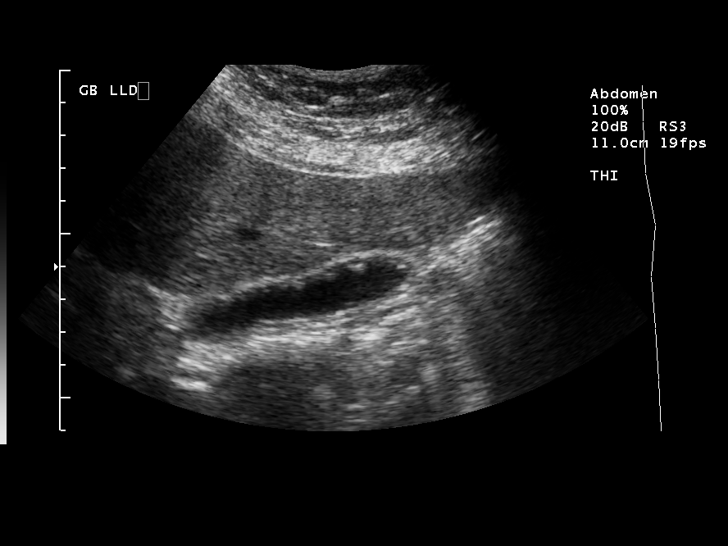
[im 59/79]
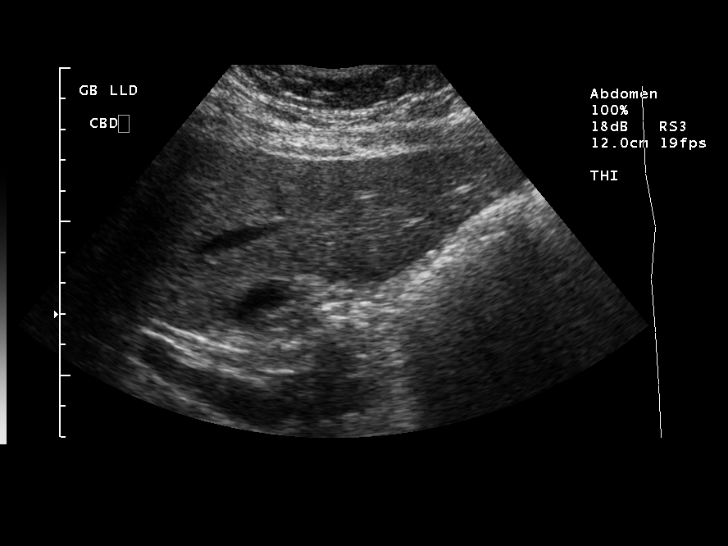
[im 66/79]
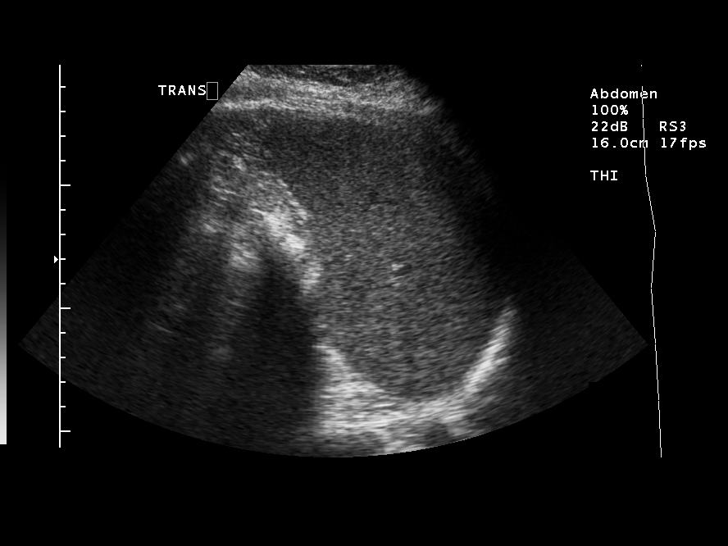
[im 72/79]
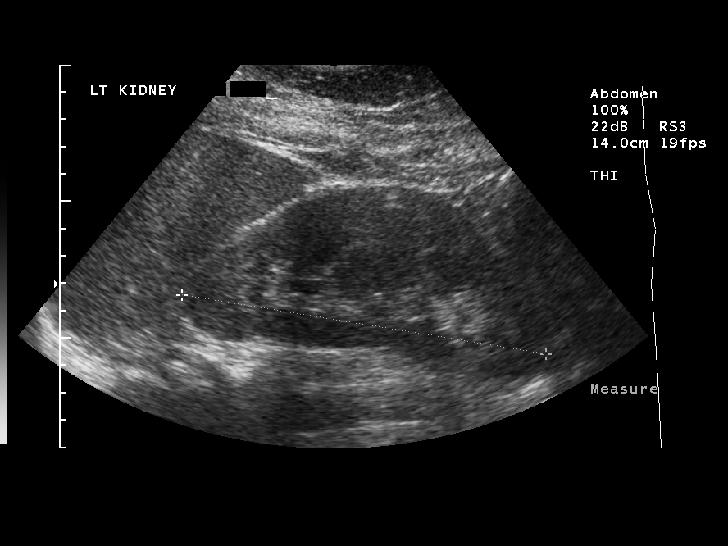
[im 79/79]
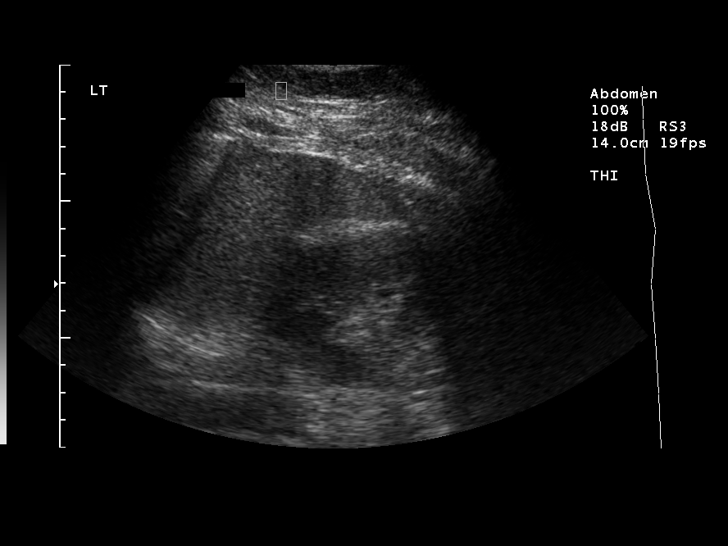

[14 of 25 positions shown; findings below may reference images not displayed]

FINDINGS: There is a 1.5 cm stone within the gallbladder lumen.
Multiple cholesterol polyps are noted.  There is accentuation of
the gallbladder wall which measures 6.6 mm.

The common bile duct is normal in caliber measuring 3.8 mm.

The liver is enlarged measuring 25 cm in craniocaudal dimension.

The spleen measures 11 cm in craniocaudal dimension.

The kidneys are normal.

The IVC and pancreas are unremarkable.

The abdominal aorta is nondilated.
IMPRESSION: 1.  Gallstones and gallbladder wall thickening.  I cannot exclude
acute cholecystitis.  If additional imaging is clinically necessary
a hepatobiliary scan may be obtained to assess patency of the
cystic duct.
2.  Gallbladder polyps.
3.  Hepatomegaly

REF:A1 DICTATED: [DATE] [DATE]

## 2008-06-12 ENCOUNTER — Ambulatory Visit (HOSPITAL_COMMUNITY): Admission: RE | Admit: 2008-06-12 | Discharge: 2008-06-12 | Payer: Self-pay | Admitting: Obstetrics & Gynecology

## 2008-09-13 IMAGING — CR DG CHEST 1V PORT
1 series · 1 of 1 positions shown · non-contrast
Comparison: None

CLINICAL DATA: Chest pain.

PORTABLE CHEST - 1 VIEW

[view not recorded]
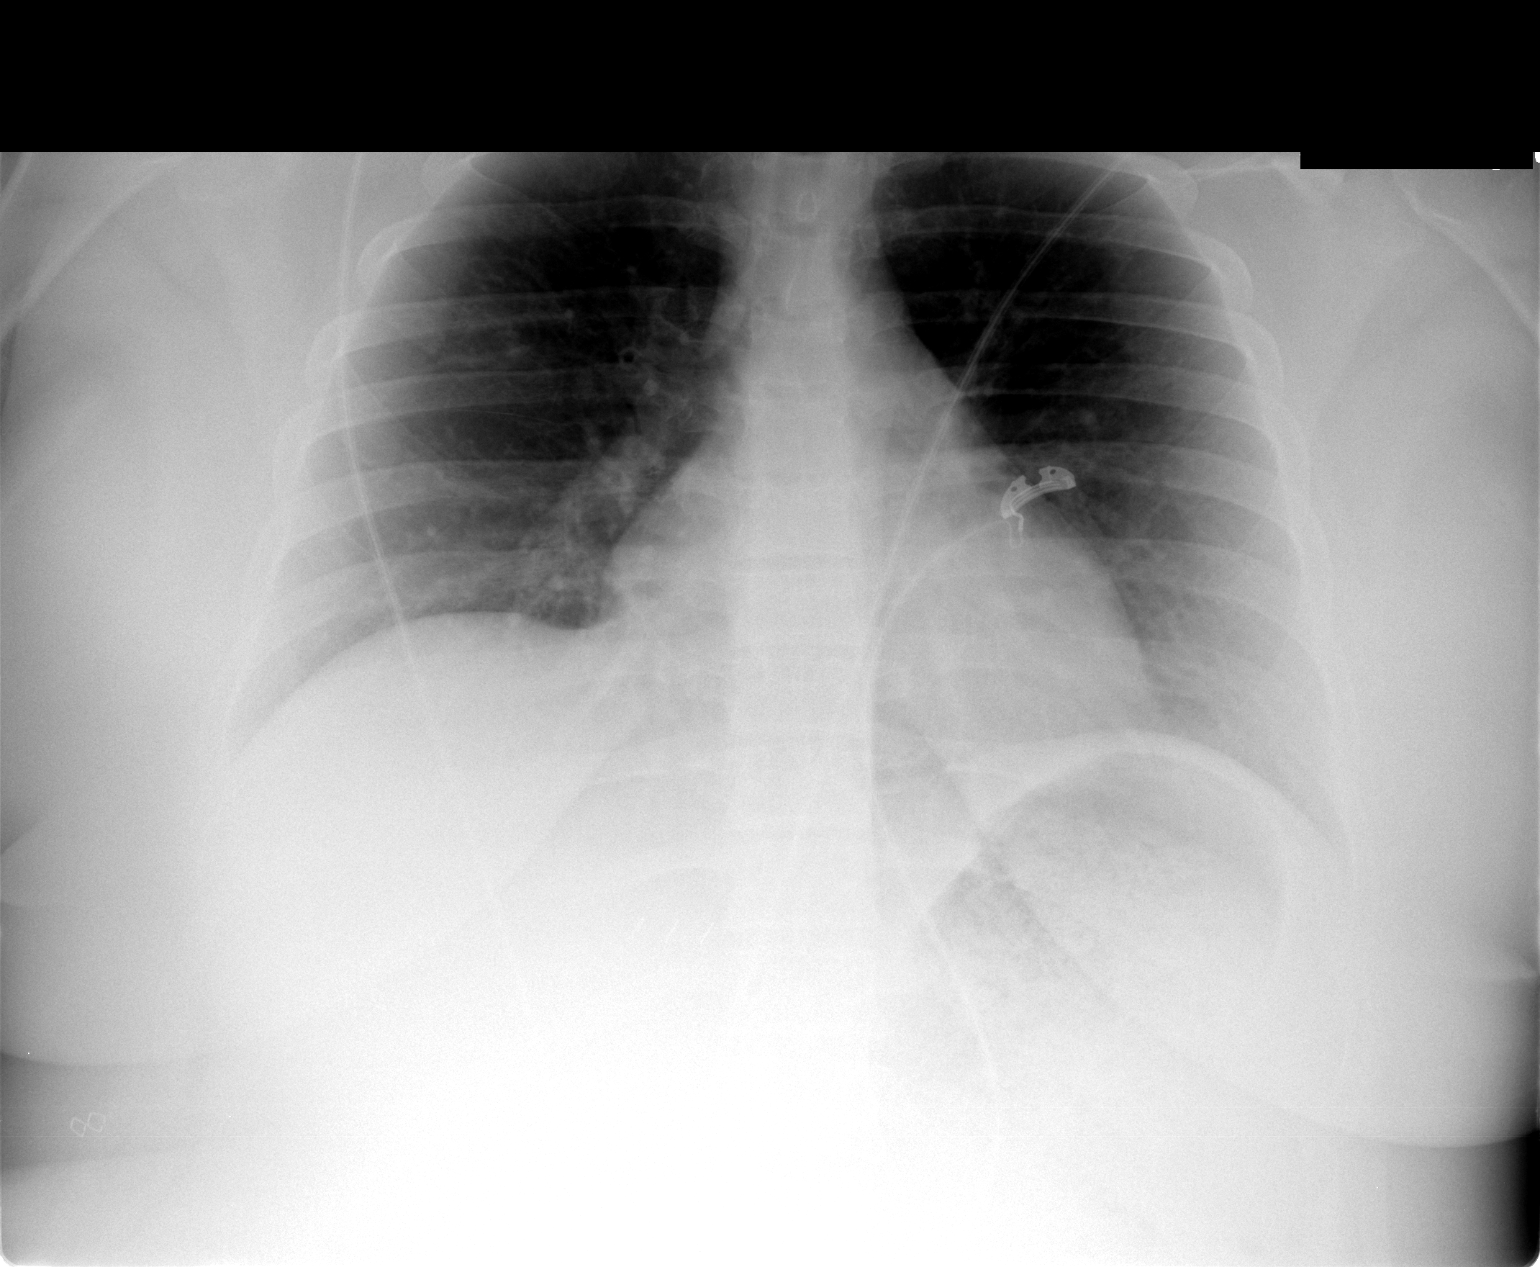

[1 of 1 positions shown; findings below may reference images not displayed]

FINDINGS: The cardiomediastinal silhouette is unremarkable.
Mild peribronchial thickening is stable.
Mild elevation of the right hemidiaphragm is unchanged.
There is no evidence of focal airspace disease, pleural effusion,
or pneumothorax.
No acute bony abnormalities are identified.
IMPRESSION: No evidence of acute cardiopulmonary disease.

## 2008-10-15 ENCOUNTER — Ambulatory Visit (HOSPITAL_COMMUNITY): Admission: RE | Admit: 2008-10-15 | Discharge: 2008-10-15 | Payer: Self-pay | Admitting: Family Medicine

## 2008-10-15 IMAGING — US US RENAL
1 series · 14 of 22 positions shown · non-contrast
Comparison: [DATE]; [DATE]

CLINICAL DATA: Right flank pain for 2 months.  Diabetes.  Prior
history of renal infection.

RENAL/URINARY TRACT ULTRASOUND COMPLETE

[Series 1: unknown · 0.37mm/px · 14 of 22 slices shown]
[im 1/22]
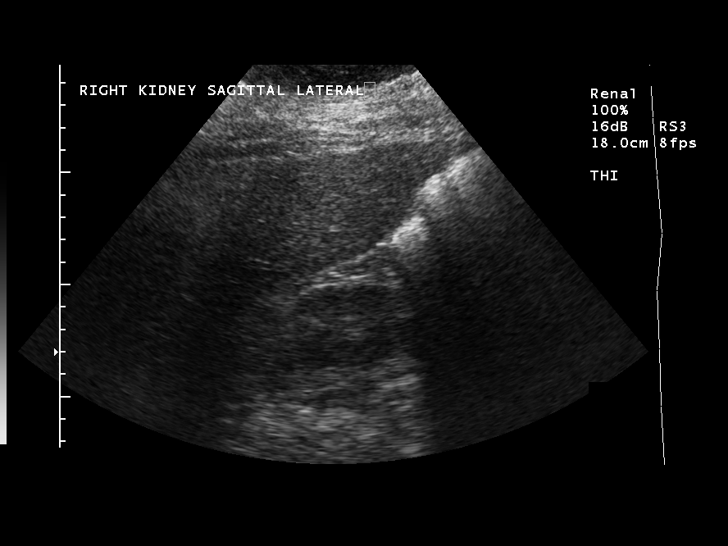
[im 3/22]
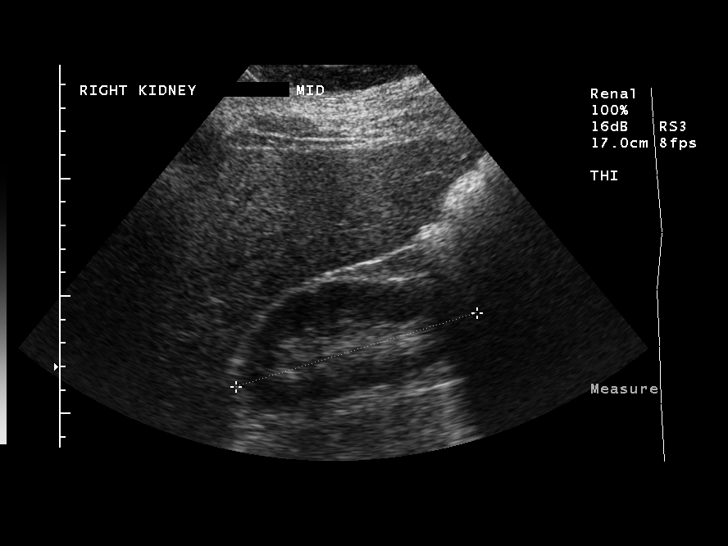
[im 4/22]
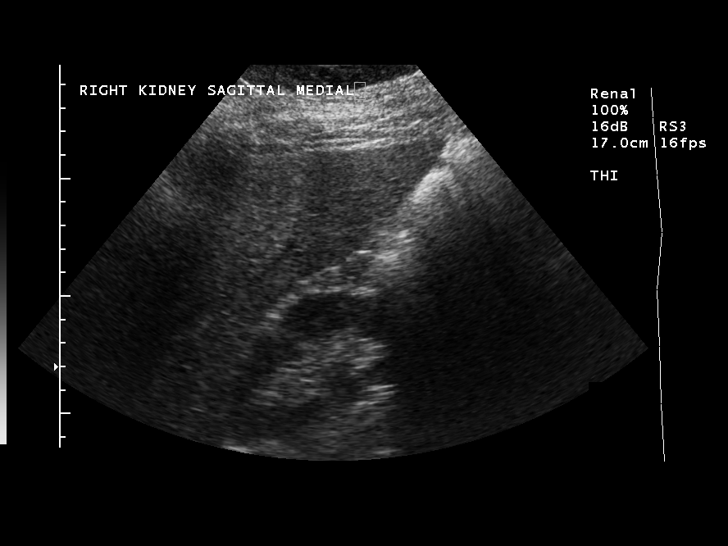
[im 6/22]
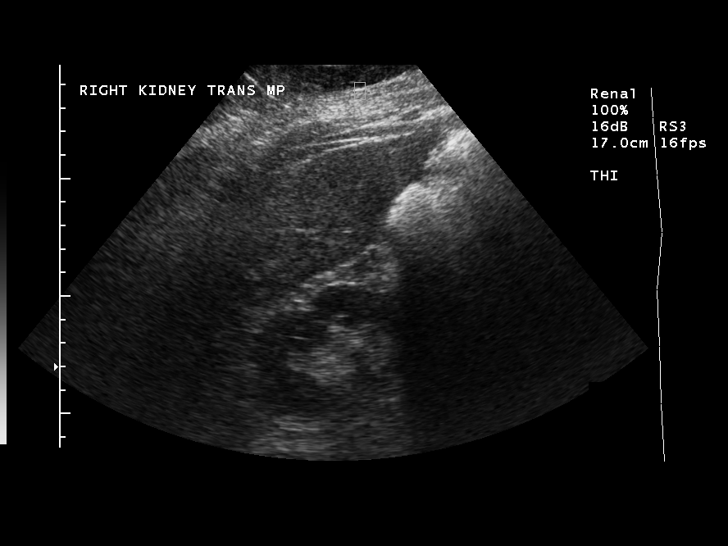
[im 8/22]
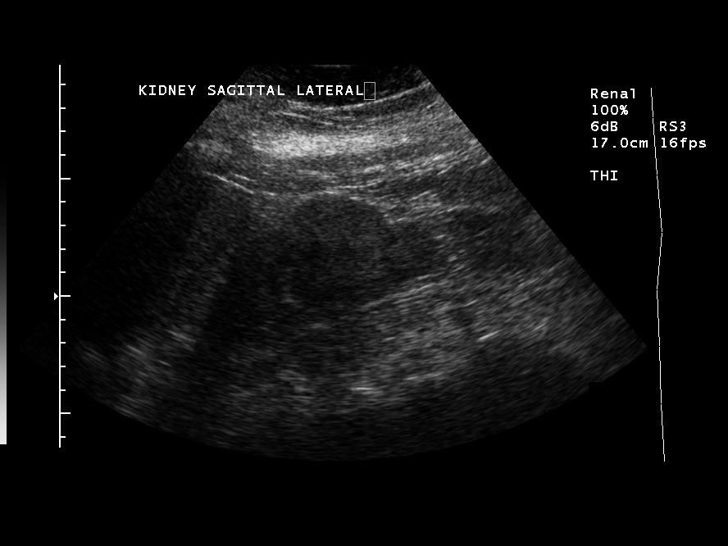
[im 9/22]
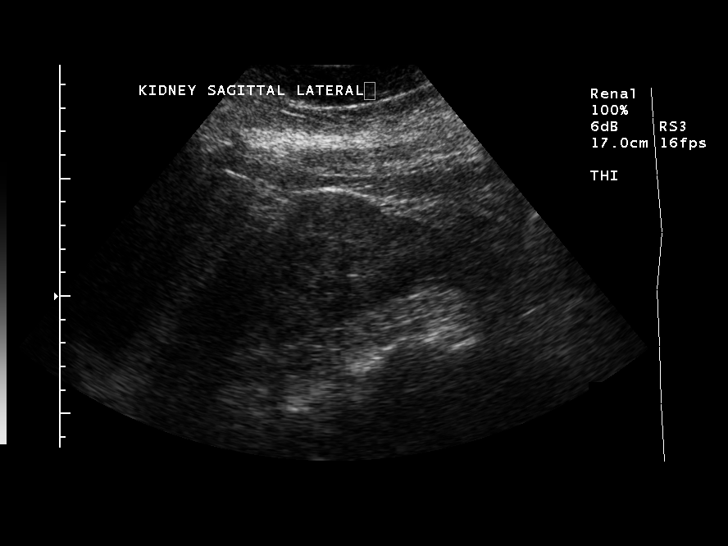
[im 11/22]
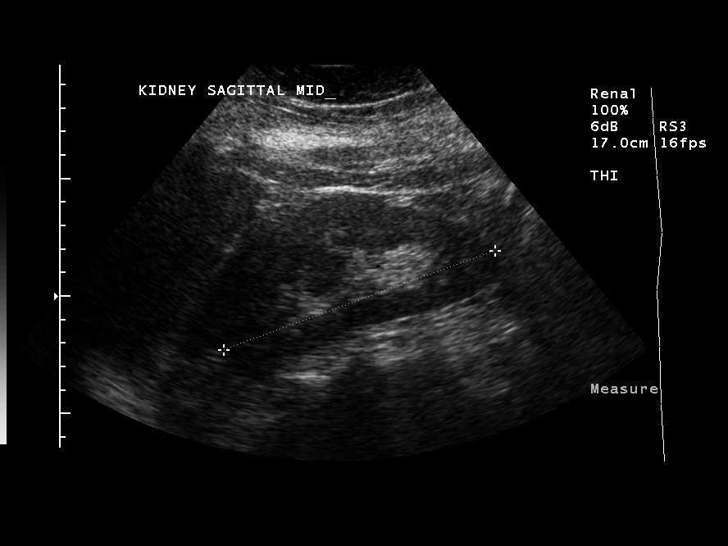
[im 12/22]
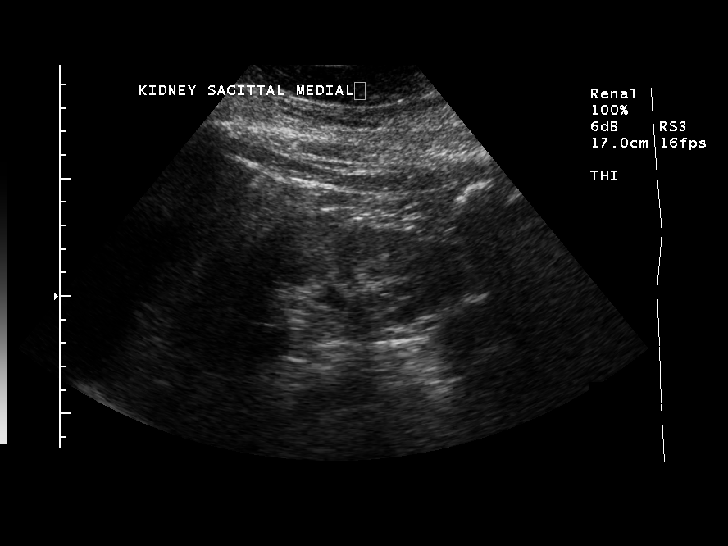
[im 14/22]
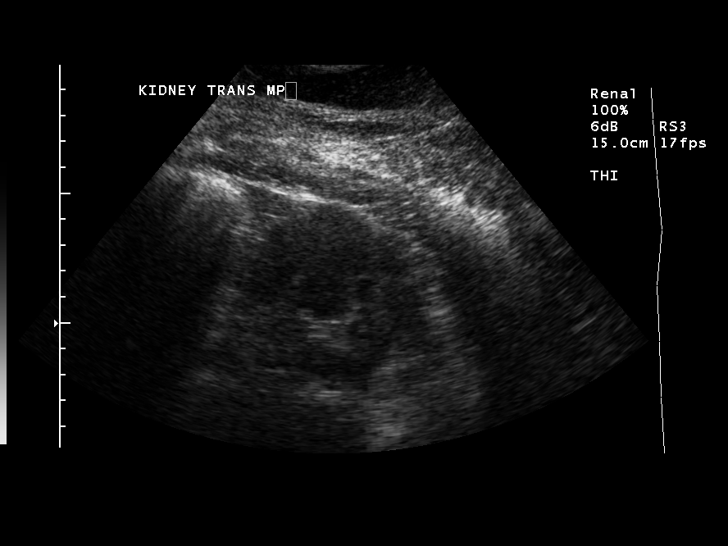
[im 15/22]
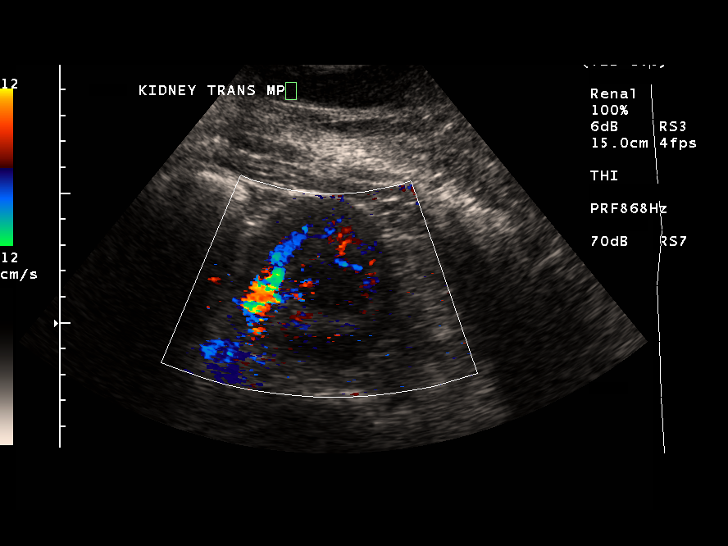
[im 17/22]
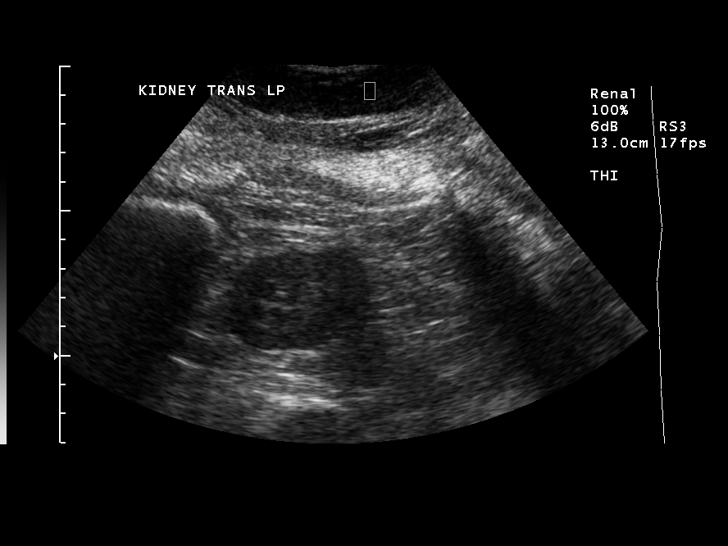
[im 19/22]
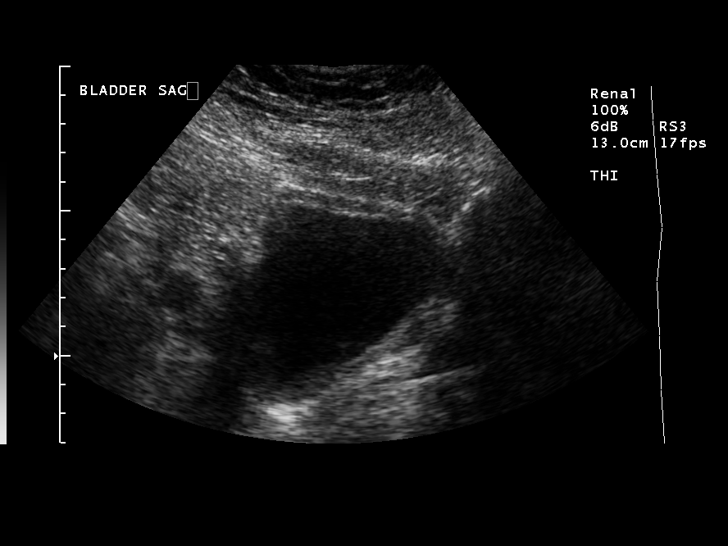
[im 20/22]
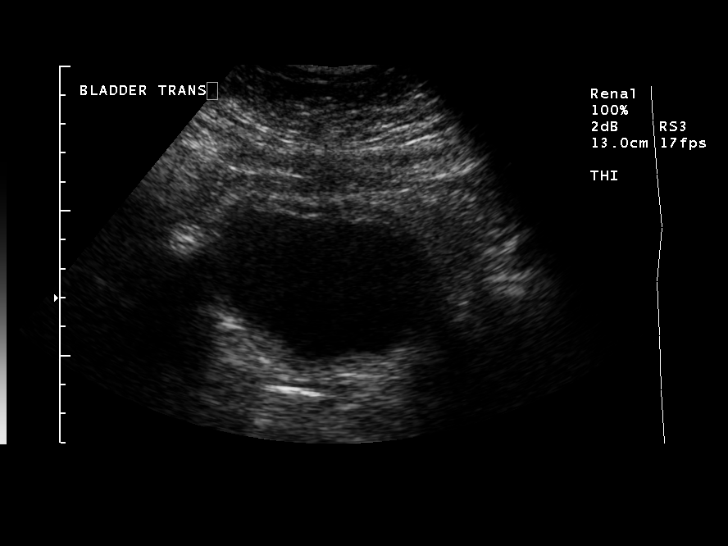
[im 22/22]
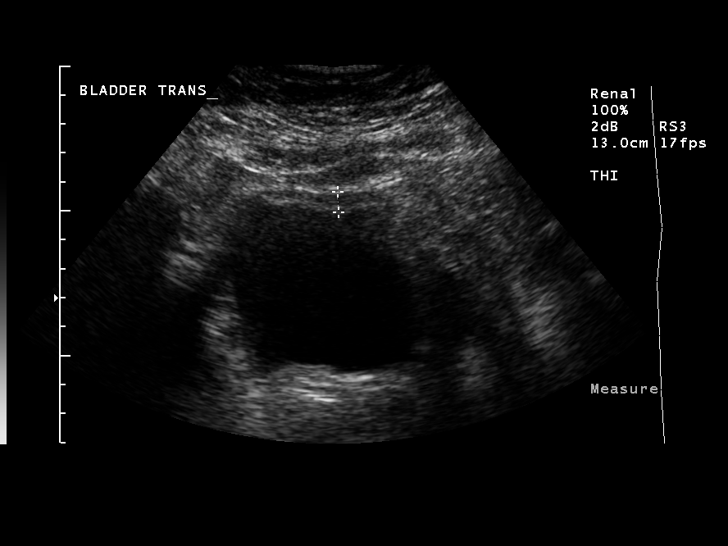

[14 of 22 positions shown; findings below may reference images not displayed]

FINDINGS: Right Kidney:  Retained fetal lobulation noted.  Longus measured
dimension is 10.7 cm.  No renal stones, masses, or hydronephrosis.
Normal echogenicity and echotexture.

Left Kidney:  Retained fetal lobulation noted.  Greatest length
measured at 12.3 cm.  Normal echogenicity and echotexture.  No
renal stones, masses, or hydronephrosis.

Bladder:  Nondistension of the urinary bladder likely causes the
apparent mild wall thickening in the urinary bladder (7 mm).
Overall this is likely within normal limits.  Correlate with urine
analysis.
IMPRESSION: 1.  No discrete renal abnormality sonographically apparent.
Urinary bladder is within normal limits.

## 2008-10-29 ENCOUNTER — Encounter (HOSPITAL_COMMUNITY): Admission: RE | Admit: 2008-10-29 | Discharge: 2008-11-28 | Payer: Self-pay | Admitting: Family Medicine

## 2008-10-29 IMAGING — US US ABDOMEN COMPLETE
1 series · 13 of 25 positions shown · non-contrast
Comparison: [DATE]

CLINICAL DATA: Right upper quadrant pain, gallstones by prior
ultrasound, history diabetes

COMPLETE ABDOMINAL ULTRASOUND

[Series 1: unknown · 0.46mm/px · 13 of 57 slices shown]
[im 1/57]
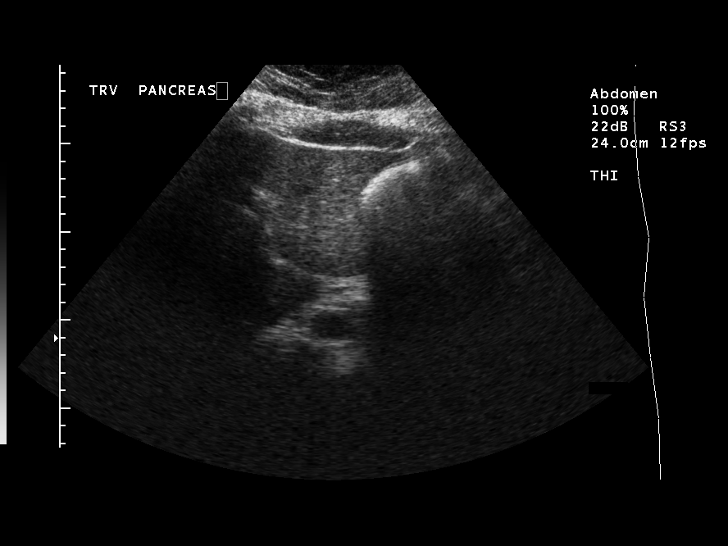
[im 5/57]
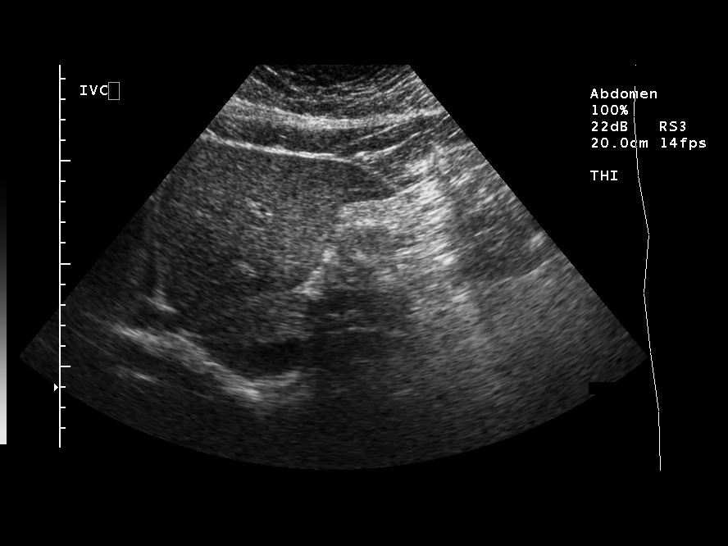
[im 10/57]
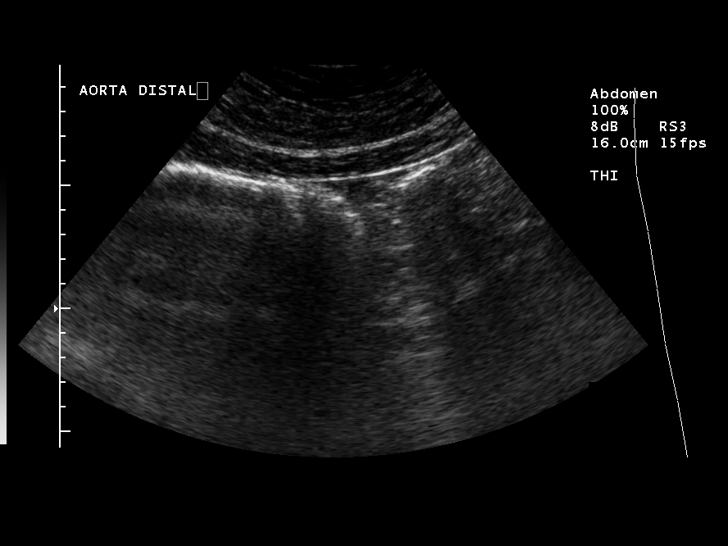
[im 15/57]
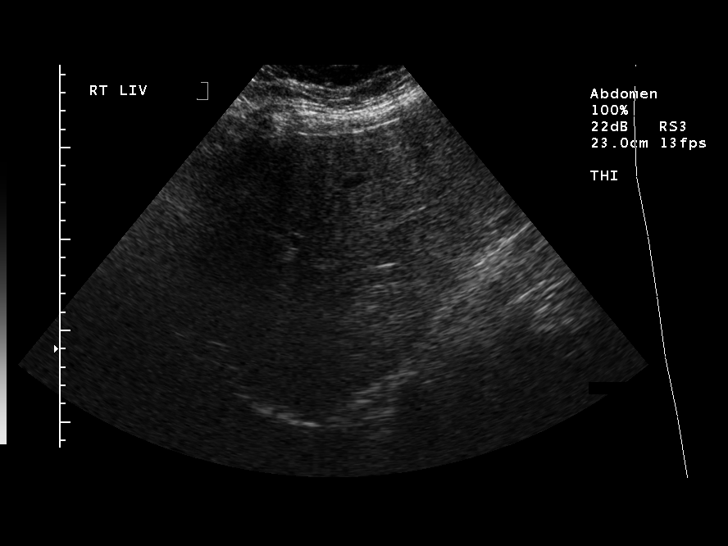
[im 19/57]
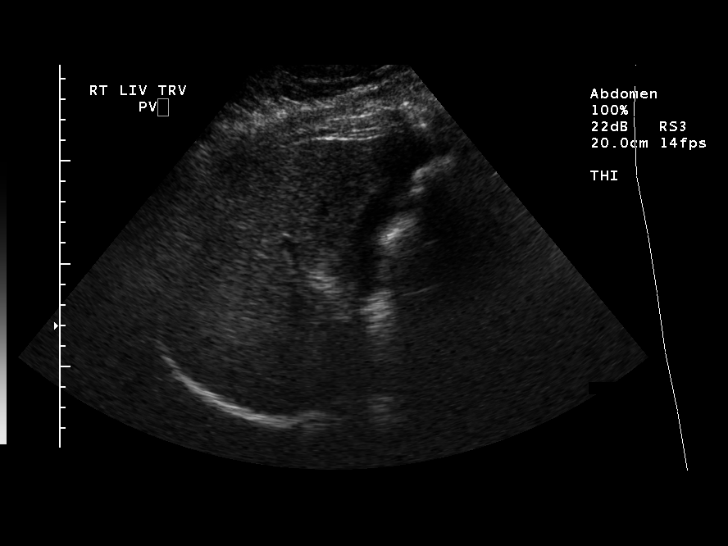
[im 24/57]
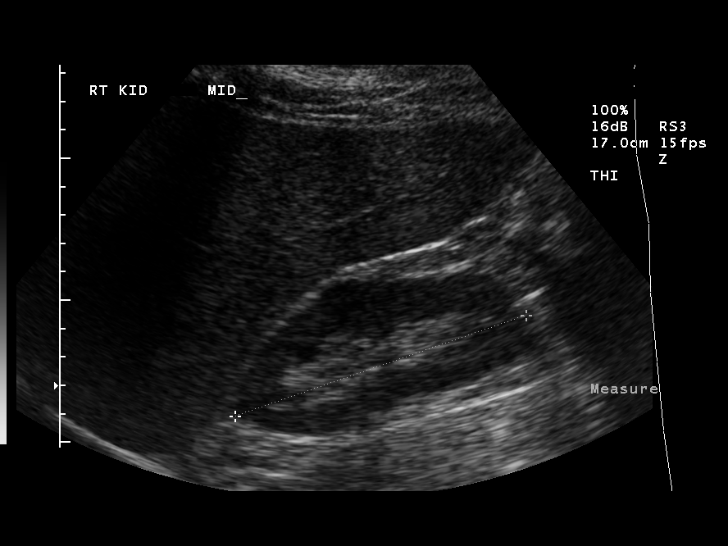
[im 29/57]
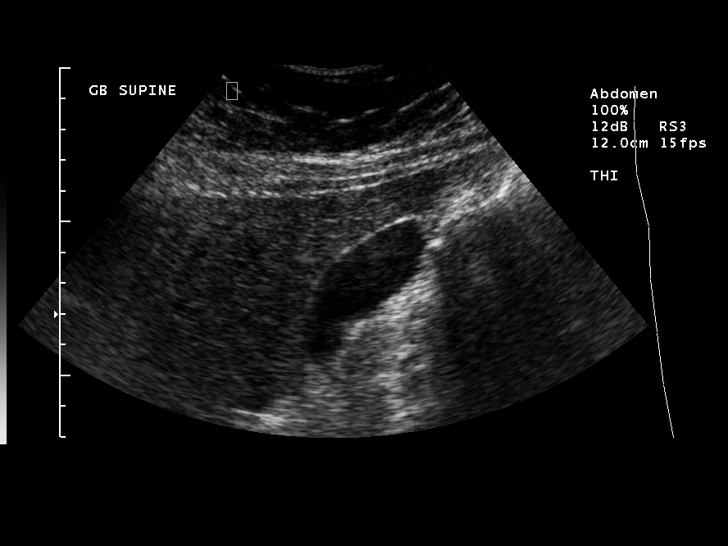
[im 33/57]
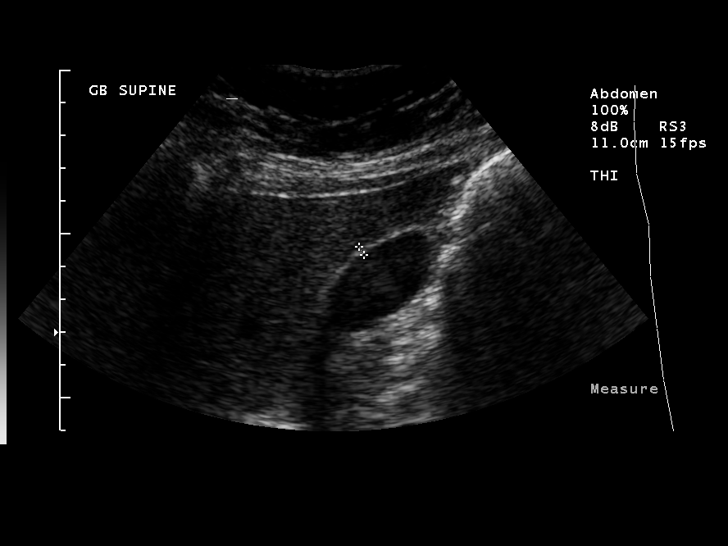
[im 38/57]
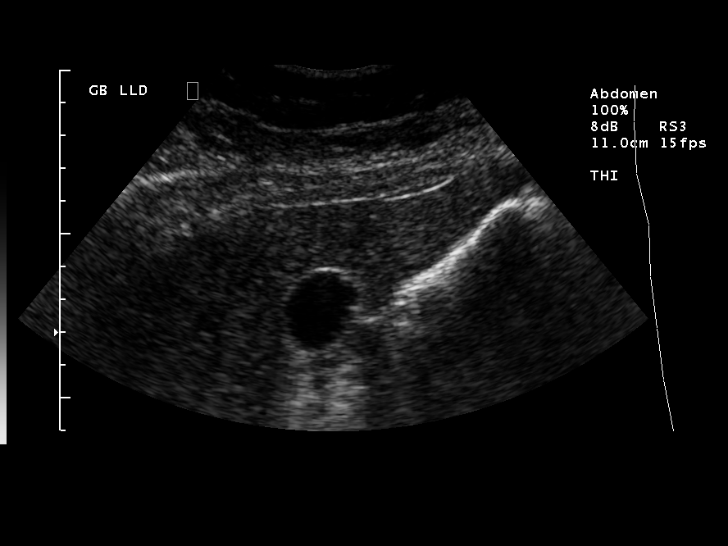
[im 43/57]
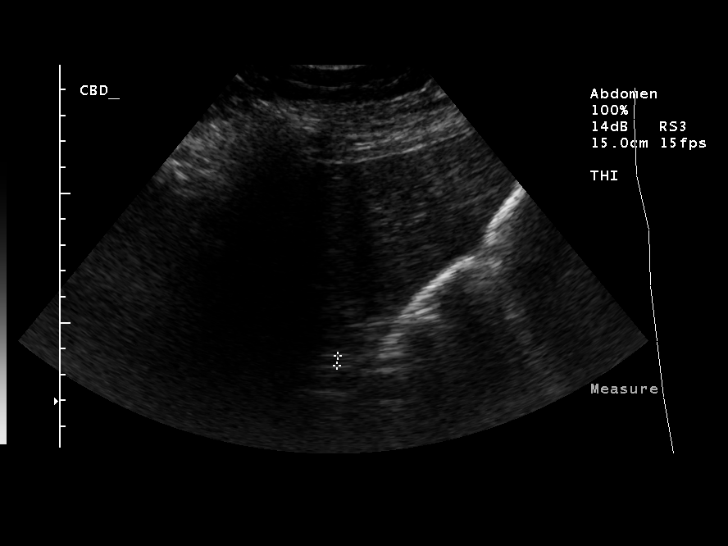
[im 47/57]
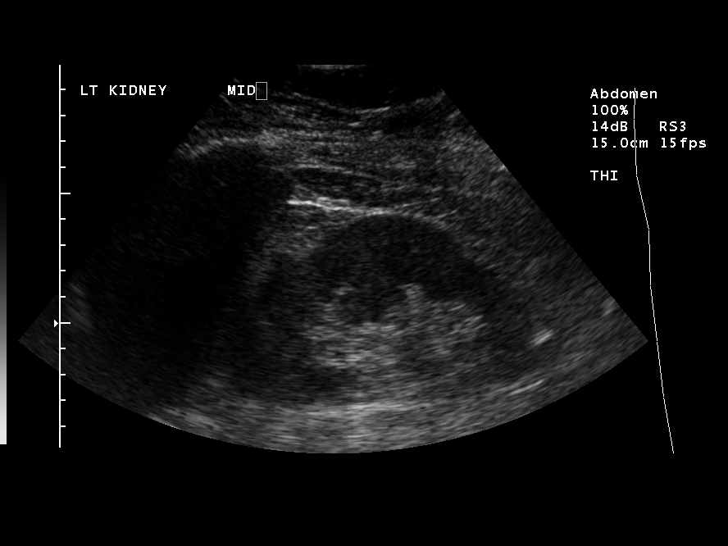
[im 52/57]
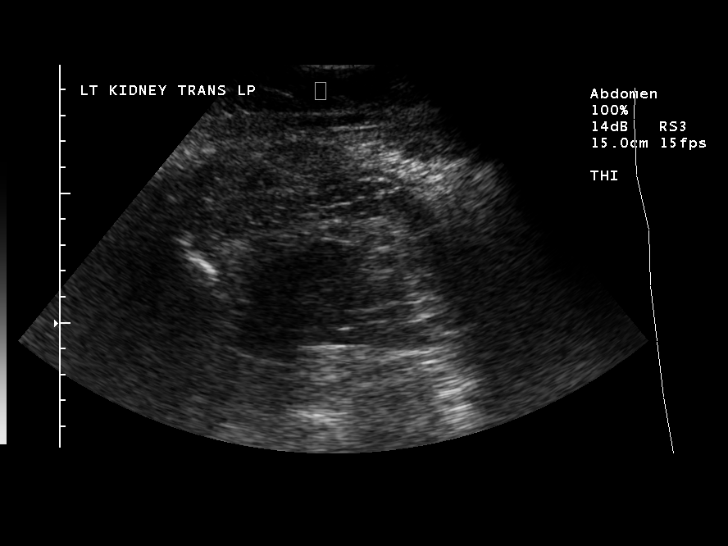
[im 57/57]
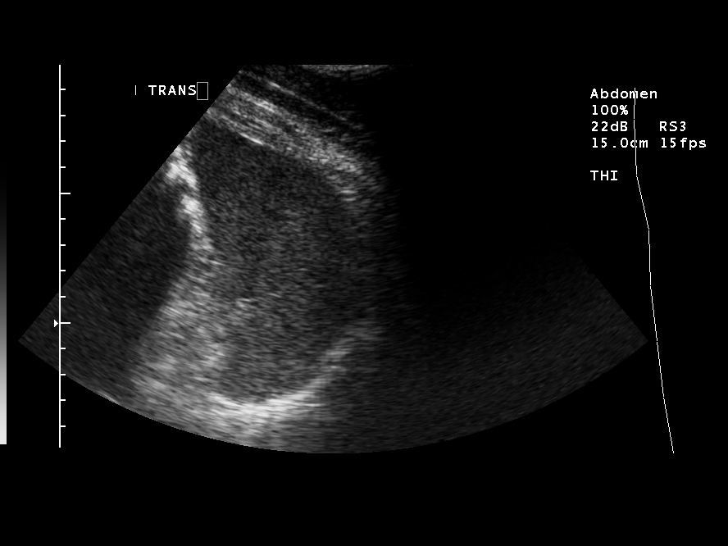

[13 of 25 positions shown; findings below may reference images not displayed]

FINDINGS: Gallbladder:  Moderately well distended.  Slightly trabeculated
wall versus multiple tiny polyps.  No definite gallbladder wall
thickening.  Shadowing calculi seen on previous exam are not
identified on current study.  No pericholecystic fluid or
sonographic Murphy's sign.  Portions of gallbladder neck are
obscured by bowel gas.

Common bile duct:  4 mm diameter, normal.

Liver:  Normal appearance.

IVC:  Unremarkable

Pancreas:  Portions of uncinate process and tail are obscured by
bowel gas, with visualized portions normal appearance.

Spleen:  Unremarkable, 8.0 cm length.

Right Kidney:  10.8 cm length.  Normal morphology without mass or
hydronephrosis.

Left Kidney:  12.3 cm length.  Dromedary hump.  No mass or
hydronephrosis.

Abdominal aorta:  Suboptimally visualized due to bowel gas.

No free fluid.
IMPRESSION: Trabeculated gallbladder wall versus multiple tiny polyps.
No definite shadowing gallstones are visualized on current study,
though portions of gallbladder neck are obscured by bowel gas.
No discrete signs of acute cholecystitis.
Incomplete visualization of aorta and pancreas as above.

## 2008-10-29 IMAGING — NM NM HEPATO W/GB/PHARM/[PERSON_NAME]
2 series · 12 of 12 positions shown · non-contrast
Comparison: None

CLINICAL DATA: Right upper quadrant pain, history gallstones

NUCLEAR MEDICINE HEPATOBILIARY IMAGING WITH GALLBLADDER EF:
TECHNIQUE: Sequential images of the abdomen were obtained for 60
minutes following intravenous administration of
radiopharmaceutical.  Patient then received an infusion of
ugm/kg of CCK analog intravenously over 30 minutes, and imaging was
continued for 30 minutes.  A time-activity curve was generated from
tracer within the gallbladder following CCK administration, and the
gallbladder ejection fraction was calculated.
Radiopharmaceutical:  5.5 mCi [T7] mebrofenin
Pharmaceutical:  2.2 mcg CCK

[Series 1: gb hepatobiliary scan · 3.19mm/px · 6 of 59 frames shown (1 of 2)]
[frame 5/59]
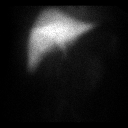
[frame 15/59]
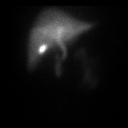
[frame 25/59]
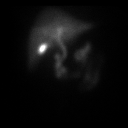
[frame 35/59]
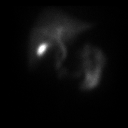
[frame 45/59]
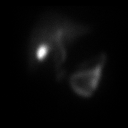
[frame 55/59]
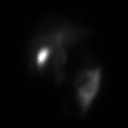

[Series 1: gb hepatobiliary scan · 3.19mm/px · 6 of 30 frames shown (2 of 2)]
[frame 3/30]
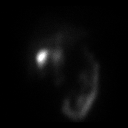
[frame 8/30]
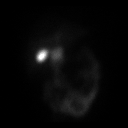
[frame 13/30]
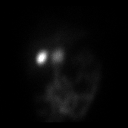
[frame 18/30]
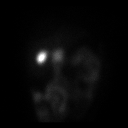
[frame 23/30]
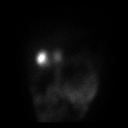
[frame 28/30]
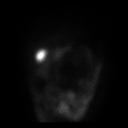

[12 of 12 positions shown; findings below may reference images not displayed]

FINDINGS: Prompt tracer extraction from bloodstream, indicating normal
hepatocellular function.
Prompt excretion of tracer into biliary tree.
Gallbladder visualized at 11 minutes.
Small bowel visualized at 15 minutes.
No hepatic retention of tracer.

Subjectively decreased emptying of tracer occurs from gallbladder
following CCK simulation.
Calculated gallbladder ejection fraction is 23%, abnormally low.
Patient did not experience symptoms following CCK administration.
IMPRESSION: Patent biliary tree.
Abnormal gallbladder response to CCK stimulation with decreased
gallbladder ejection fraction of 23%.

## 2008-11-06 ENCOUNTER — Encounter (INDEPENDENT_AMBULATORY_CARE_PROVIDER_SITE_OTHER): Payer: Self-pay | Admitting: General Surgery

## 2008-11-06 ENCOUNTER — Ambulatory Visit (HOSPITAL_COMMUNITY): Admission: RE | Admit: 2008-11-06 | Discharge: 2008-11-06 | Payer: Self-pay | Admitting: General Surgery

## 2008-11-09 ENCOUNTER — Emergency Department (HOSPITAL_COMMUNITY): Admission: EM | Admit: 2008-11-09 | Discharge: 2008-11-09 | Payer: Self-pay | Admitting: Emergency Medicine

## 2008-11-09 IMAGING — CT CT ANGIO CHEST
2 of 4 series · 8 of 36 positions shown · IV contrast (Omnipaque 300)
Comparison: [DATE]

CLINICAL DATA: Chest pain and shortness of breath.

CT ANGIOGRAPHY CHEST WITH CONTRAST
TECHNIQUE: Multidetector CT imaging of the chest was performed
using the standard protocol during bolus administration of
intravenous contrast. Multiplanar CT image reconstructions
including MIPs were obtained to evaluate the vascular anatomy.
Contrast: 100 ml intravenous [18]

[Series 9: pe 3.0 b40f · axial · 0.67mm/px · z∈[+926,+1100]mm · 5 of 88 slices shown]
[im 15/88  lung]
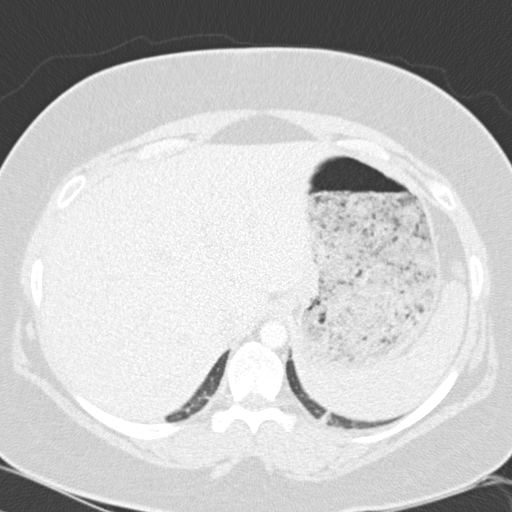
[im 30/88  mediastinal]
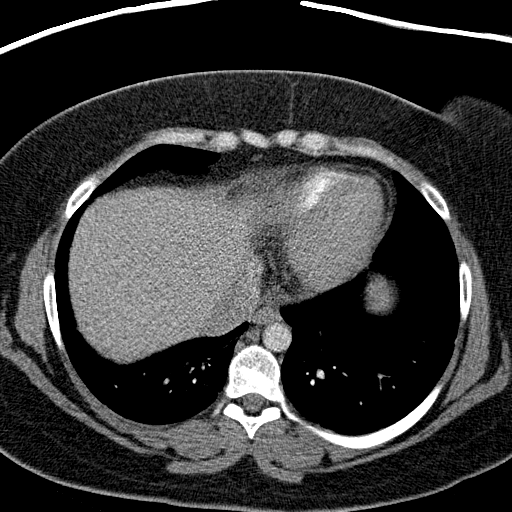
[im 44/88  lung]
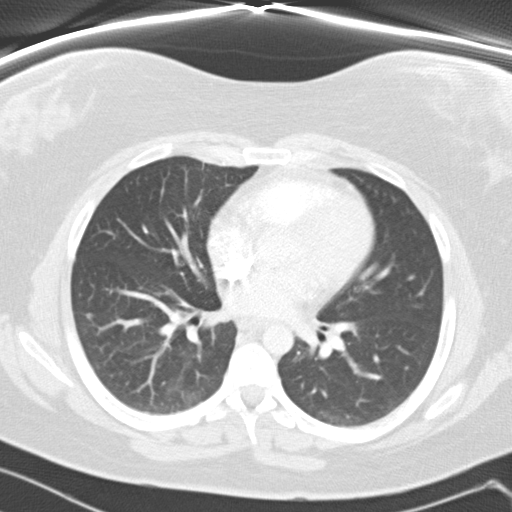
[im 59/88  mediastinal]
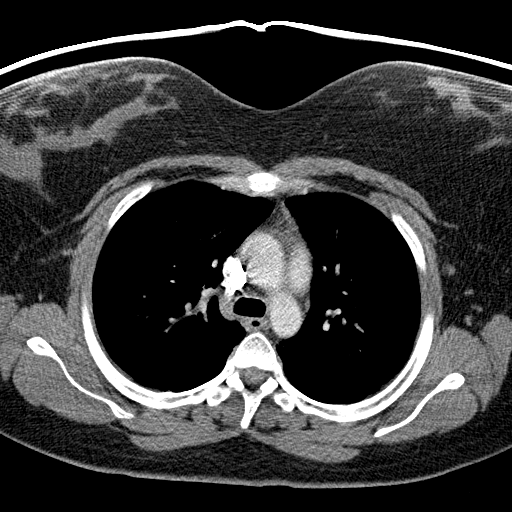
[im 73/88  lung]
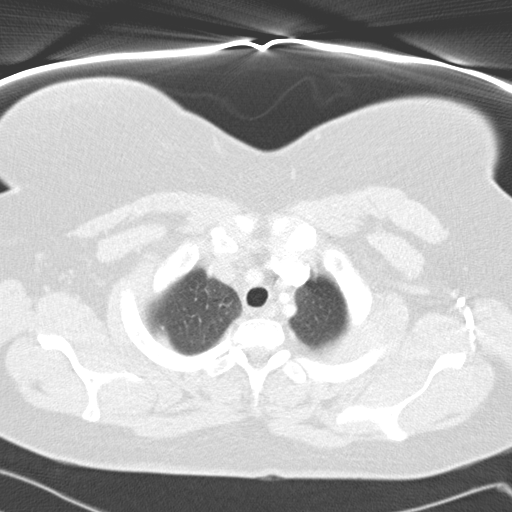

[Series 11: mpr coronal pe 3mm · coronal · 0.54mm/px · 3 of 101 slices shown]
[im 21/101  mediastinal]
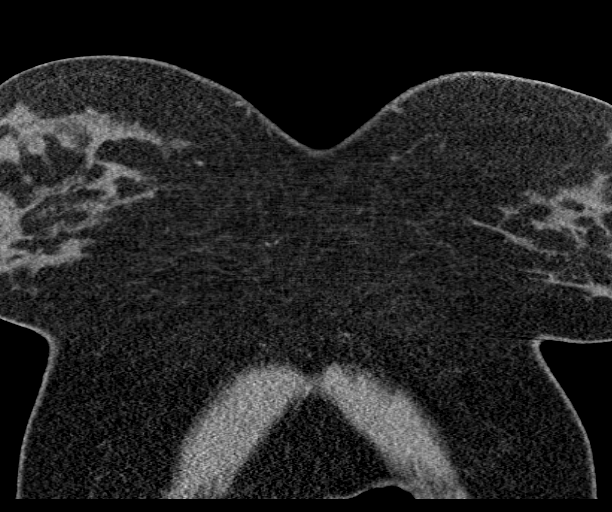
[im 41/101  mediastinal]
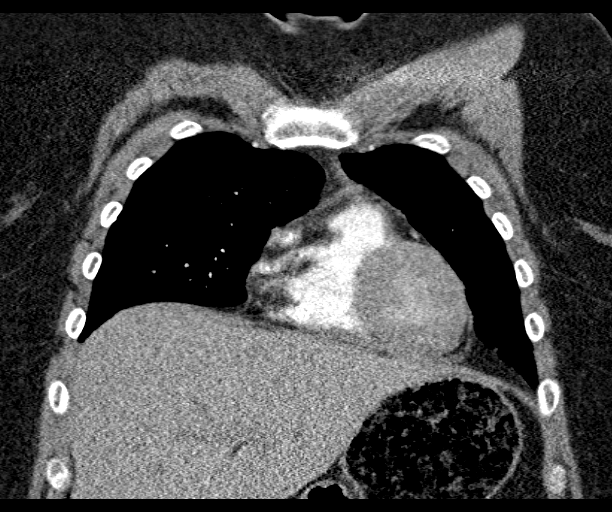
[im 61/101  mediastinal]
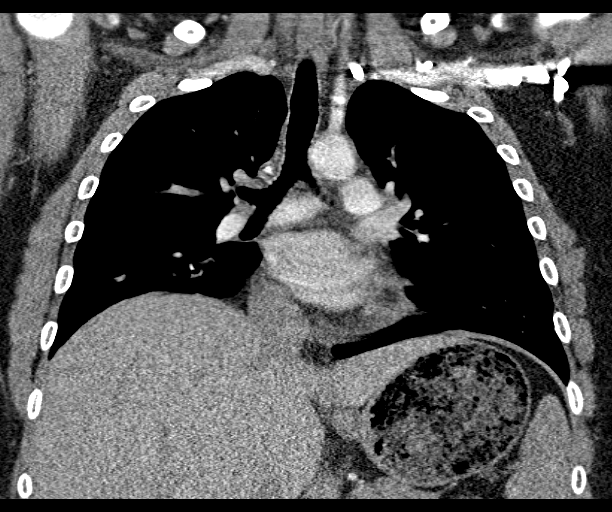

[8 of 36 positions shown; findings below may reference images not displayed]

FINDINGS: This is a technically adequate study.

There is no evidence of pulmonary emboli.
No evidence of thoracic aortic aneurysm is noted.
The heart and great vessels are within normal limits.

Mild bibasilar atelectasis noted.
Stable noncalcified bilateral pulmonary nodules are again
identified.
No evidence of focal airspace disease or consolidation.

No acute or suspicious bony abnormalities are identified.

Review of the MIP images confirms the above findings.
IMPRESSION: No evidence of pulmonary emboli or thoracic aortic aneurysm.

Mild bibasilar atelectasis.

Stable noncalcified bilateral pulmonary nodules - likely benign in
the absence of a known primary malignancy.  Consider follow-up
chest CT in 12 months to insure stability.

## 2010-04-05 ENCOUNTER — Emergency Department (HOSPITAL_COMMUNITY)
Admission: EM | Admit: 2010-04-05 | Discharge: 2010-04-05 | Payer: Self-pay | Source: Home / Self Care | Admitting: Emergency Medicine

## 2010-04-05 IMAGING — CR DG FOOT COMPLETE 3+V*L*
3 series · 3 of 3 positions shown · non-contrast
Comparison: None.

CLINICAL DATA: Pain and foot.  No injury.

LEFT FOOT - COMPLETE 3+ VIEW

[view not recorded (1 of 3)]
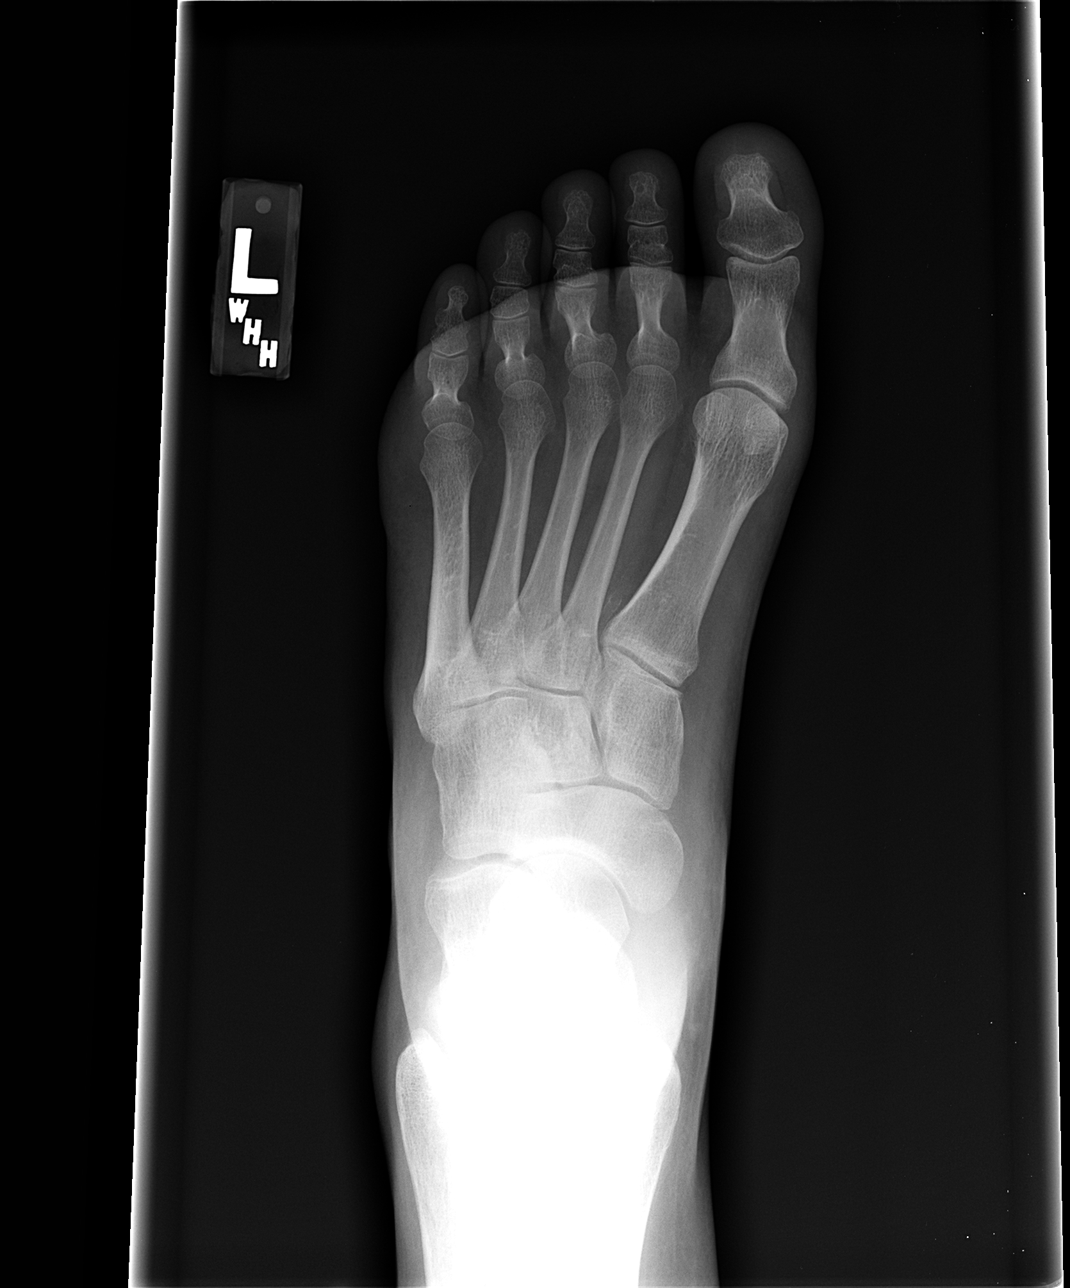

[view not recorded (2 of 3)]
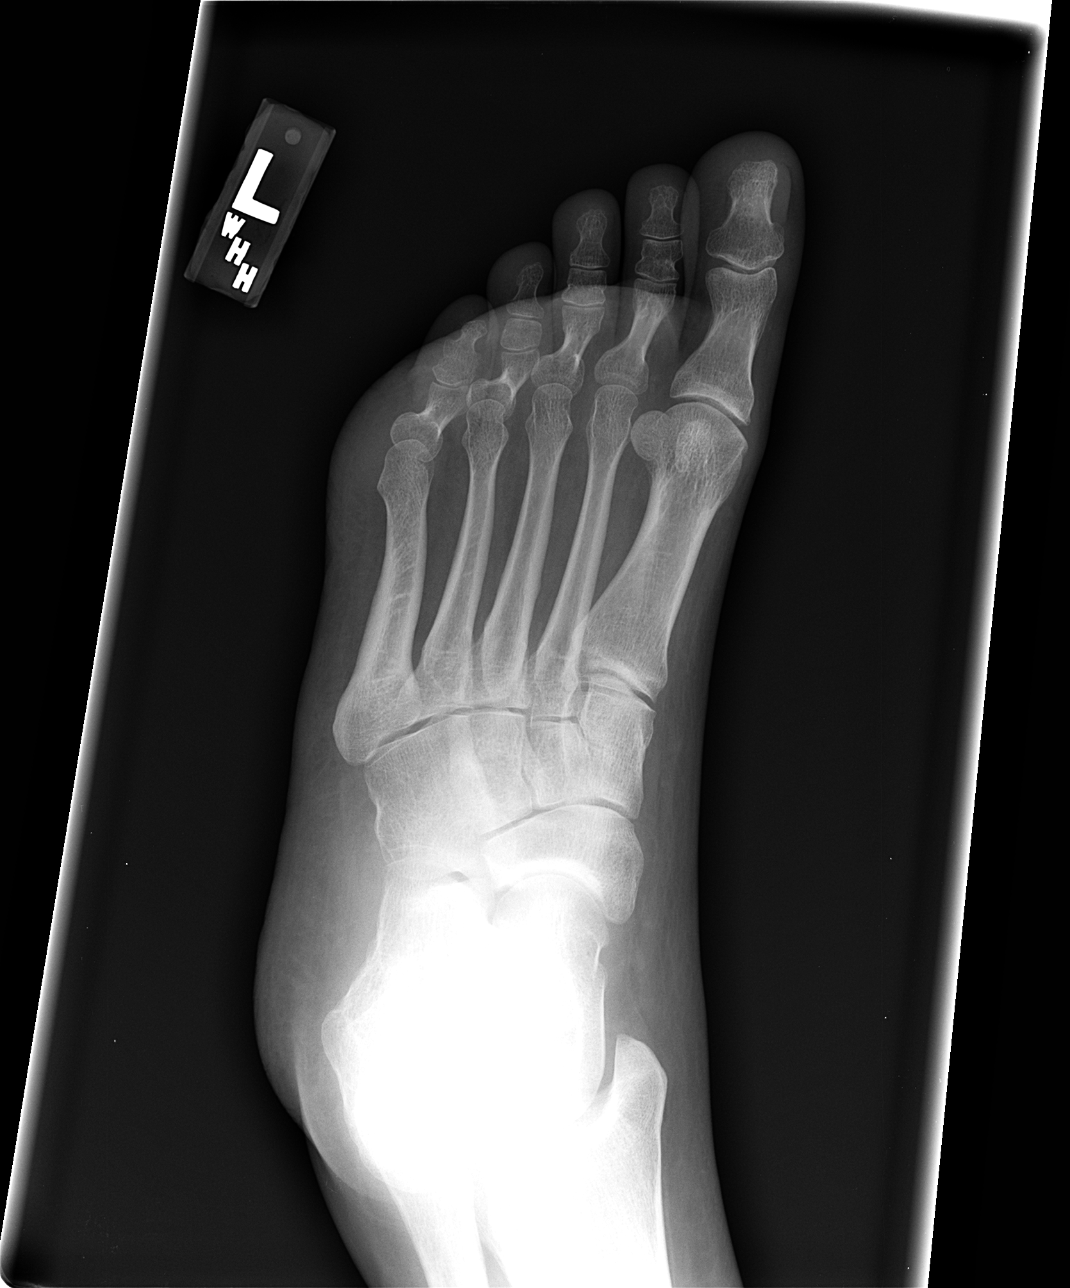

[view not recorded (3 of 3)]
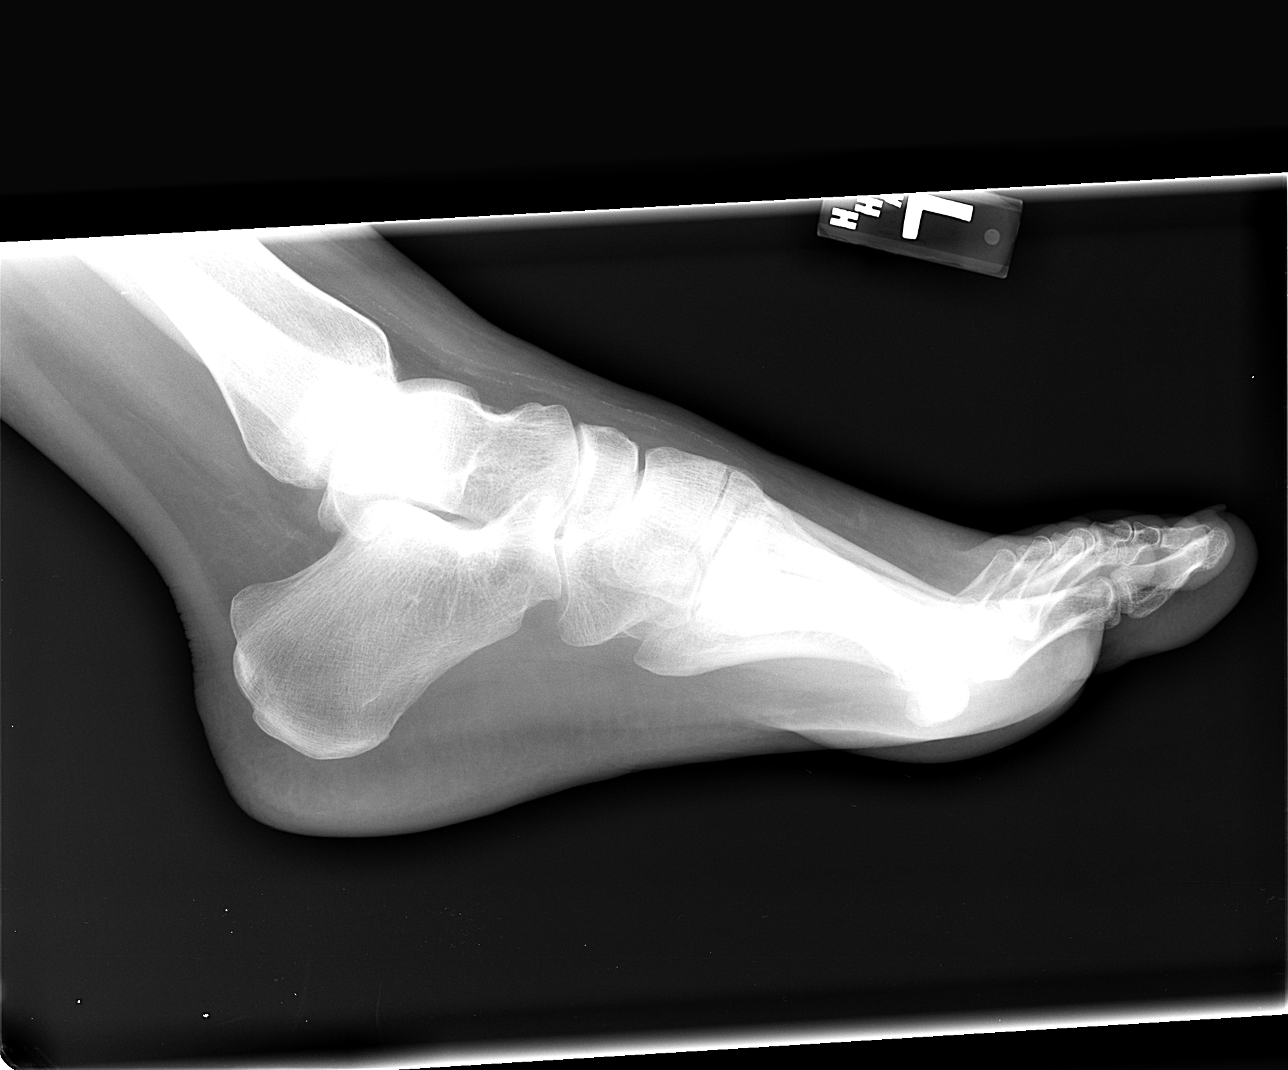

[3 of 3 positions shown; findings below may reference images not displayed]

FINDINGS: Alignment at the Lisfranc joint appears normal.  There is
atherosclerotic calcification in the dorsalis pedis artery.

No metatarsal fracture noted.  No phalangeal fracture or acute bony
findings are identified.
IMPRESSION: 1.  Atherosclerosis.
2.  A specific finding to explain the patient's foot pain is not
identified.  If symptoms persist despite conservative therapy, MRI
followup may be warranted.

## 2010-04-22 ENCOUNTER — Ambulatory Visit (HOSPITAL_COMMUNITY)
Admission: RE | Admit: 2010-04-22 | Discharge: 2010-04-22 | Payer: Self-pay | Source: Home / Self Care | Attending: Family Medicine | Admitting: Family Medicine

## 2010-04-22 IMAGING — CR DG FOOT COMPLETE 3+V*L*
3 series · 3 of 3 positions shown · non-contrast
Comparison: [DATE]

CLINICAL DATA: Left foot pain and swelling - no known injury

LEFT FOOT - COMPLETE 3+ VIEW

[view not recorded (1 of 3)]
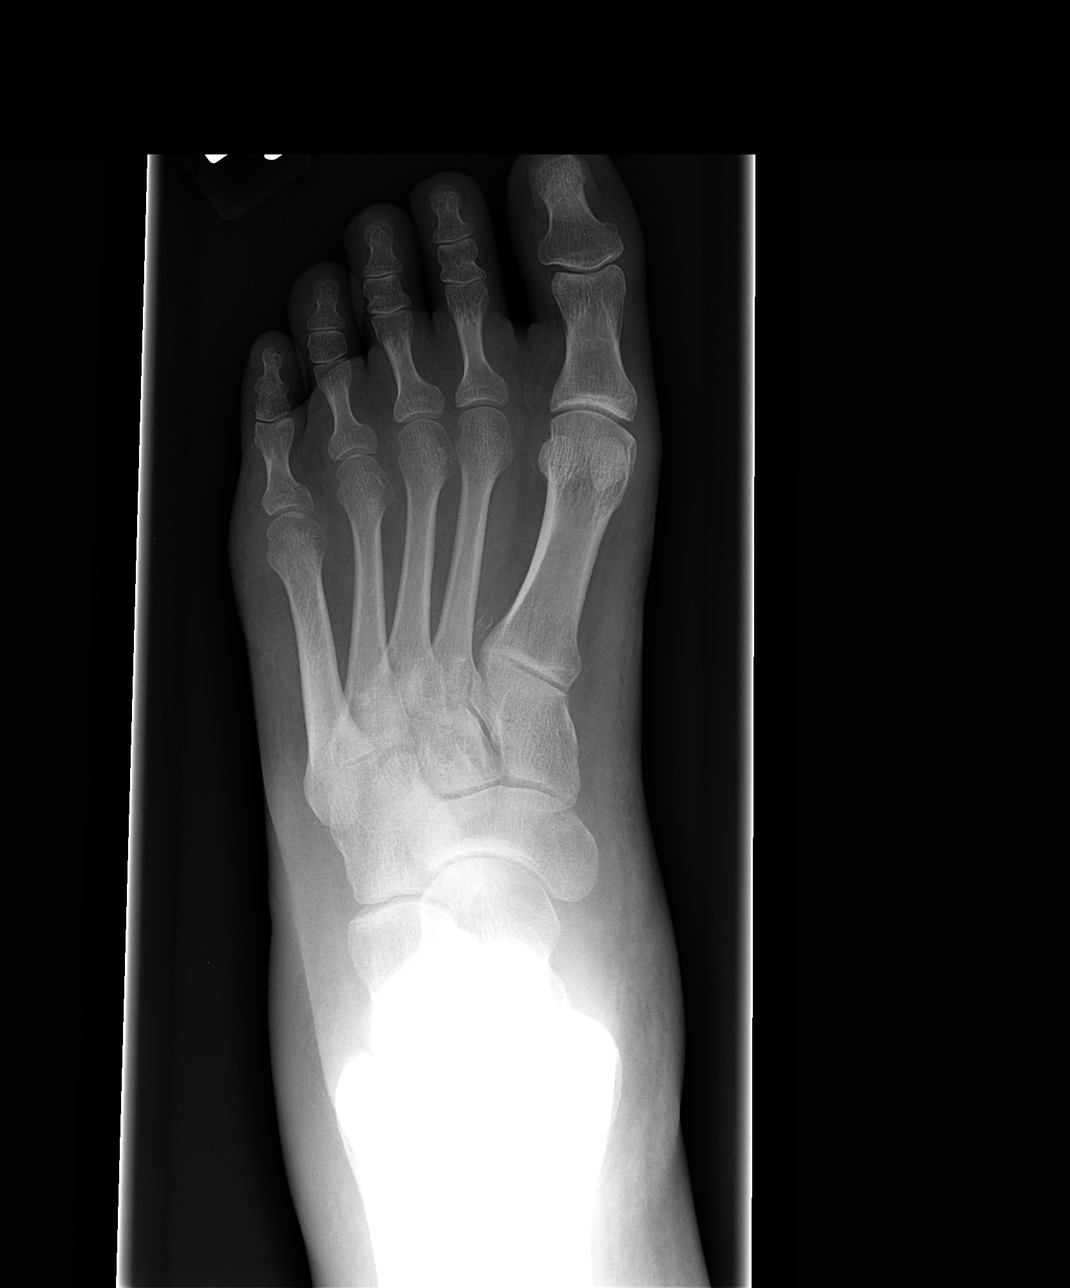

[view not recorded (2 of 3)]
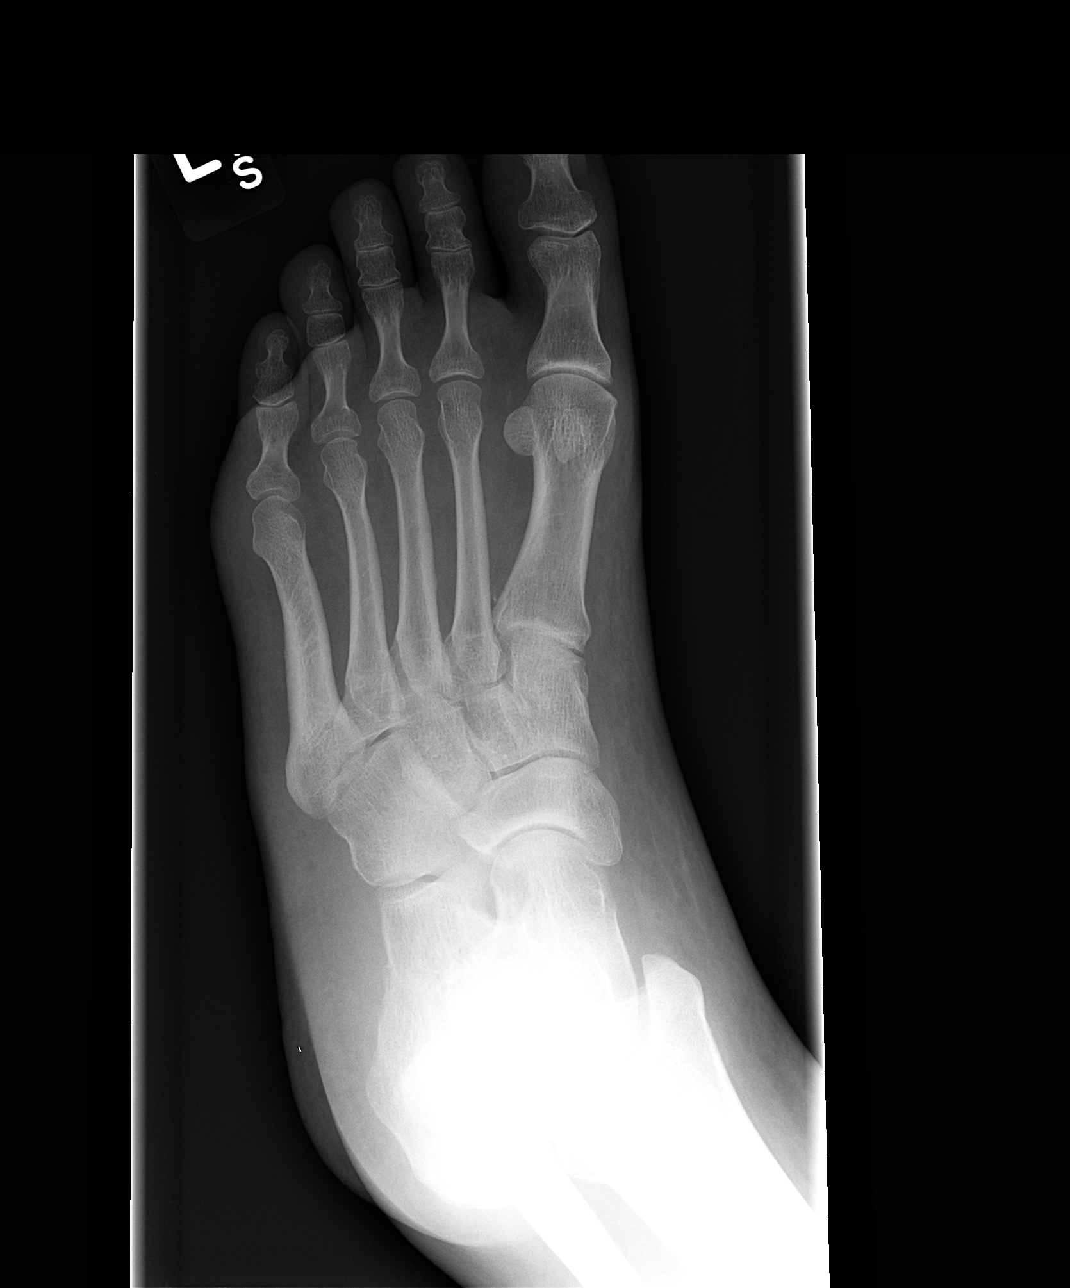

[view not recorded (3 of 3)]
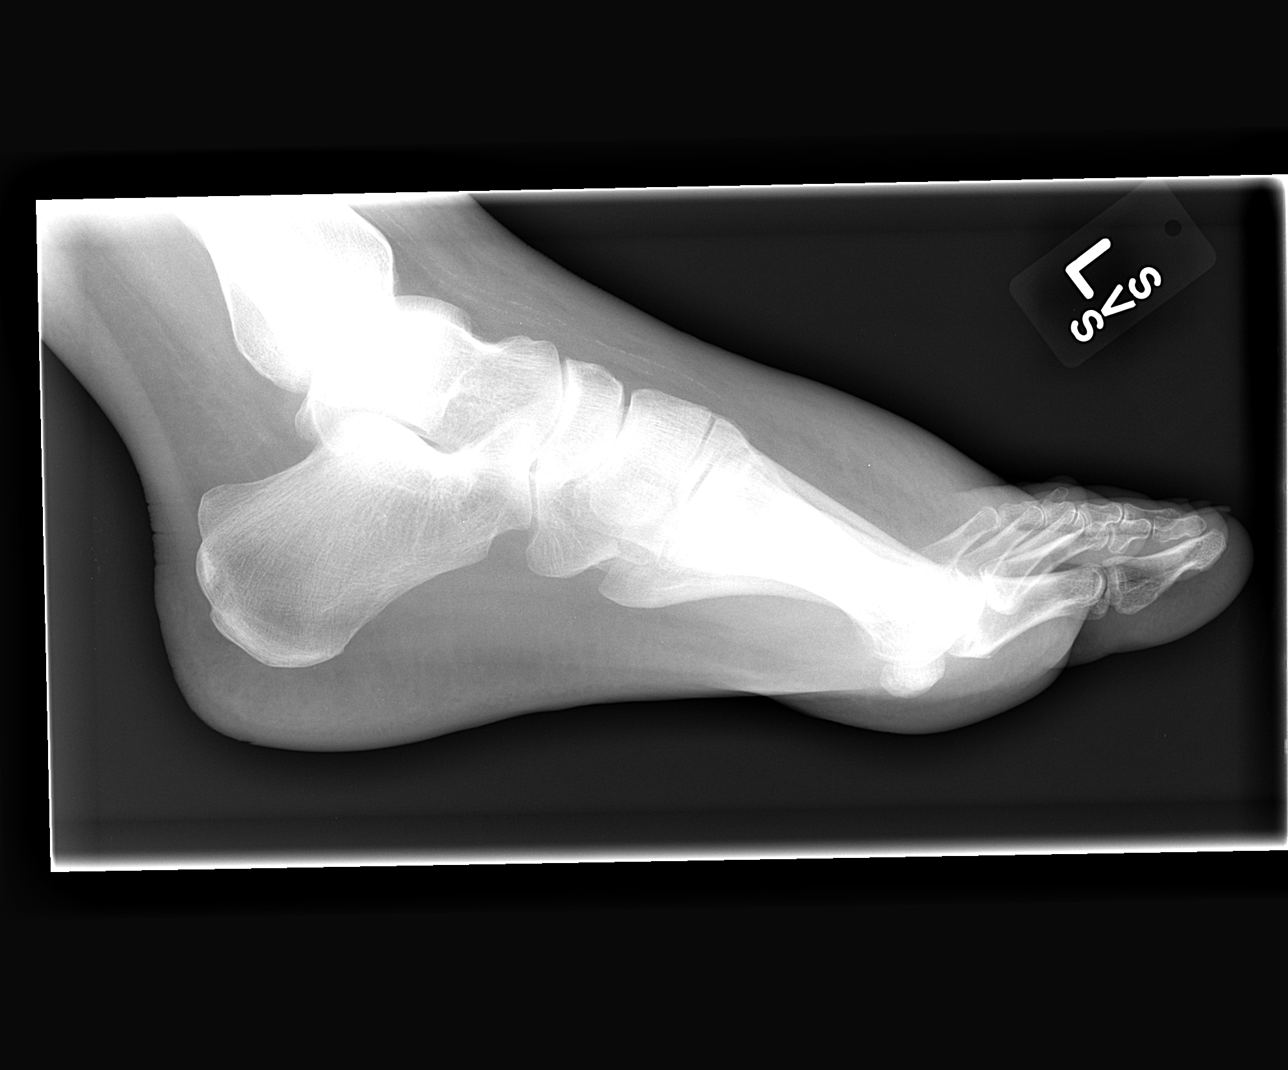

[3 of 3 positions shown; findings below may reference images not displayed]

FINDINGS: No fracture or dislocation.  There is increased soft
tissue swelling over the dorsum of the foot in the region of the
metatarsals, compared to the prior study.  However no bony or joint
abnormalities identified.  Again noted is calcification of the
dorsalis pedis artery.
IMPRESSION: Interval increase in soft tissue swelling over the dorsum of the
foot in the metatarsal region.  Otherwise no change.

## 2010-05-24 ENCOUNTER — Encounter: Payer: Self-pay | Admitting: Obstetrics & Gynecology

## 2010-05-25 ENCOUNTER — Encounter: Payer: Self-pay | Admitting: Family Medicine

## 2010-08-09 LAB — CBC
HCT: 37.4 % (ref 36.0–46.0)
Hemoglobin: 13.3 g/dL (ref 12.0–15.0)
MCHC: 35.5 g/dL (ref 30.0–36.0)
MCHC: 35.5 g/dL (ref 30.0–36.0)
MCV: 83.6 fL (ref 78.0–100.0)
MCV: 84.1 fL (ref 78.0–100.0)
RBC: 4.03 MIL/uL (ref 3.87–5.11)
RBC: 4.47 MIL/uL (ref 3.87–5.11)
RDW: 14.3 % (ref 11.5–15.5)
RDW: 14.4 % (ref 11.5–15.5)
WBC: 8.7 10*3/uL (ref 4.0–10.5)

## 2010-08-09 LAB — COMPREHENSIVE METABOLIC PANEL
AST: 18 U/L (ref 0–37)
Albumin: 3.5 g/dL (ref 3.5–5.2)
Alkaline Phosphatase: 85 U/L (ref 39–117)
BUN: 17 mg/dL (ref 6–23)
BUN: 18 mg/dL (ref 6–23)
CO2: 26 mEq/L (ref 19–32)
Calcium: 8.9 mg/dL (ref 8.4–10.5)
Chloride: 104 mEq/L (ref 96–112)
Creatinine, Ser: 1.2 mg/dL (ref 0.4–1.2)
GFR calc Af Amer: >60 mL/min (ref >60)
GFR calc non Af Amer: 54 mL/min — ABNORMAL LOW (ref >60)
Glucose, Bld: 291 mg/dL — ABNORMAL HIGH (ref 70–99)
Potassium: 4.5 mEq/L (ref 3.5–5.1)
Total Bilirubin: 0.7 mg/dL (ref 0.3–1.2)
Total Protein: 6.1 g/dL (ref 6.0–8.3)

## 2010-08-09 LAB — GLUCOSE, CAPILLARY
Glucose-Capillary: 240 mg/dL — ABNORMAL HIGH (ref 70–99)
Glucose-Capillary: 295 mg/dL — ABNORMAL HIGH (ref 70–99)
Glucose-Capillary: 295 mg/dL — ABNORMAL HIGH (ref 70–99)

## 2010-08-09 LAB — LIPASE, BLOOD: Lipase: 24 U/L (ref 11–59)

## 2010-08-09 LAB — DIFFERENTIAL
Basophils Relative: 0 % (ref 0–1)
Lymphs Abs: 2.1 10*3/uL (ref 0.7–4.0)
Monocytes Relative: 7 % (ref 3–12)
Neutro Abs: 5.9 10*3/uL (ref 1.7–7.7)
Neutrophils Relative %: 67 % (ref 43–77)

## 2010-08-09 LAB — POCT CARDIAC MARKERS
CKMB, poc: 1.7 ng/mL (ref 1.0–8.0)
Troponin i, poc: <0.05 ng/mL (ref 0.00–0.09)

## 2010-08-17 LAB — BASIC METABOLIC PANEL
BUN: 10 mg/dL (ref 6–23)
BUN: 16 mg/dL (ref 6–23)
BUN: 8 mg/dL (ref 6–23)
BUN: 11 mg/dL (ref 6–23)
CO2: 22 mEq/L (ref 19–32)
CO2: 23 mEq/L (ref 19–32)
CO2: 16 mEq/L — ABNORMAL LOW (ref 19–32)
CO2: 29 mEq/L (ref 19–32)
Calcium: 7.5 mg/dL — ABNORMAL LOW (ref 8.4–10.5)
Calcium: 8.1 mg/dL — ABNORMAL LOW (ref 8.4–10.5)
Chloride: 105 mEq/L (ref 96–112)
Chloride: 107 mEq/L (ref 96–112)
Chloride: 102 mEq/L (ref 96–112)
Chloride: 96 mEq/L (ref 96–112)
Creatinine, Ser: 1 mg/dL (ref 0.4–1.2)
Creatinine, Ser: 1.54 mg/dL — ABNORMAL HIGH (ref 0.4–1.2)
GFR calc Af Amer: 31 mL/min — ABNORMAL LOW (ref >60)
GFR calc Af Amer: >60 mL/min (ref >60)
GFR calc Af Amer: >60 mL/min (ref >60)
GFR calc non Af Amer: 26 mL/min — ABNORMAL LOW (ref >60)
GFR calc non Af Amer: 53 mL/min — ABNORMAL LOW (ref >60)
GFR calc non Af Amer: 59 mL/min — ABNORMAL LOW (ref >60)
Glucose, Bld: 156 mg/dL — ABNORMAL HIGH (ref 70–99)
Glucose, Bld: 190 mg/dL — ABNORMAL HIGH (ref 70–99)
Glucose, Bld: 566 mg/dL (ref 70–99)
Potassium: 3.2 mEq/L — ABNORMAL LOW (ref 3.5–5.1)
Potassium: 3.3 mEq/L — ABNORMAL LOW (ref 3.5–5.1)
Potassium: 3.5 mEq/L (ref 3.5–5.1)
Potassium: 4.6 mEq/L (ref 3.5–5.1)
Sodium: 130 mEq/L — ABNORMAL LOW (ref 135–145)
Sodium: 135 mEq/L (ref 135–145)
Sodium: 137 mEq/L (ref 135–145)
Sodium: 138 mEq/L (ref 135–145)
Sodium: 139 mEq/L (ref 135–145)

## 2010-08-17 LAB — CBC
HCT: 26.3 % — ABNORMAL LOW (ref 36.0–46.0)
HCT: 26.7 % — ABNORMAL LOW (ref 36.0–46.0)
HCT: 27.9 % — ABNORMAL LOW (ref 36.0–46.0)
Hemoglobin: 10.6 g/dL — ABNORMAL LOW (ref 12.0–15.0)
Hemoglobin: 8.9 g/dL — ABNORMAL LOW (ref 12.0–15.0)
Hemoglobin: 10.7 g/dL — ABNORMAL LOW (ref 12.0–15.0)
MCHC: 32.8 g/dL (ref 30.0–36.0)
MCHC: 33.2 g/dL (ref 30.0–36.0)
MCHC: 32.6 g/dL (ref 30.0–36.0)
MCHC: 32.7 g/dL (ref 30.0–36.0)
MCV: 84.8 fL (ref 78.0–100.0)
MCV: 85 fL (ref 78.0–100.0)
MCV: 85.9 fL (ref 78.0–100.0)
Platelets: 233 10*3/uL (ref 150–400)
Platelets: 335 10*3/uL (ref 150–400)
Platelets: 493 10*3/uL — ABNORMAL HIGH (ref 150–400)
Platelets: 632 10*3/uL — ABNORMAL HIGH (ref 150–400)
RBC: 3.12 MIL/uL — ABNORMAL LOW (ref 3.87–5.11)
RBC: 3.79 MIL/uL — ABNORMAL LOW (ref 3.87–5.11)
RBC: 3.86 MIL/uL — ABNORMAL LOW (ref 3.87–5.11)
RDW: 14.8 % (ref 11.5–15.5)
RDW: 14.9 % (ref 11.5–15.5)
WBC: 10.8 10*3/uL — ABNORMAL HIGH (ref 4.0–10.5)
WBC: 11.9 10*3/uL — ABNORMAL HIGH (ref 4.0–10.5)
WBC: 13.3 10*3/uL — ABNORMAL HIGH (ref 4.0–10.5)
WBC: 11.4 10*3/uL — ABNORMAL HIGH (ref 4.0–10.5)

## 2010-08-17 LAB — URINALYSIS, ROUTINE W REFLEX MICROSCOPIC
Glucose, UA: 250 mg/dL — AB
Specific Gravity, Urine: 1.025 (ref 1.005–1.030)
Urobilinogen, UA: 0.2 mg/dL (ref 0.0–1.0)

## 2010-08-17 LAB — GLUCOSE, CAPILLARY
Glucose-Capillary: 111 mg/dL — ABNORMAL HIGH (ref 70–99)
Glucose-Capillary: 122 mg/dL — ABNORMAL HIGH (ref 70–99)
Glucose-Capillary: 156 mg/dL — ABNORMAL HIGH (ref 70–99)
Glucose-Capillary: 161 mg/dL — ABNORMAL HIGH (ref 70–99)
Glucose-Capillary: 165 mg/dL — ABNORMAL HIGH (ref 70–99)
Glucose-Capillary: 167 mg/dL — ABNORMAL HIGH (ref 70–99)
Glucose-Capillary: 170 mg/dL — ABNORMAL HIGH (ref 70–99)
Glucose-Capillary: 212 mg/dL — ABNORMAL HIGH (ref 70–99)
Glucose-Capillary: 222 mg/dL — ABNORMAL HIGH (ref 70–99)
Glucose-Capillary: 237 mg/dL — ABNORMAL HIGH (ref 70–99)
Glucose-Capillary: 247 mg/dL — ABNORMAL HIGH (ref 70–99)
Glucose-Capillary: 255 mg/dL — ABNORMAL HIGH (ref 70–99)
Glucose-Capillary: 258 mg/dL — ABNORMAL HIGH (ref 70–99)
Glucose-Capillary: 274 mg/dL — ABNORMAL HIGH (ref 70–99)
Glucose-Capillary: 309 mg/dL — ABNORMAL HIGH (ref 70–99)
Glucose-Capillary: 134 mg/dL — ABNORMAL HIGH (ref 70–99)
Glucose-Capillary: 142 mg/dL — ABNORMAL HIGH (ref 70–99)
Glucose-Capillary: 182 mg/dL — ABNORMAL HIGH (ref 70–99)
Glucose-Capillary: 187 mg/dL — ABNORMAL HIGH (ref 70–99)
Glucose-Capillary: 222 mg/dL — ABNORMAL HIGH (ref 70–99)
Glucose-Capillary: 465 mg/dL — ABNORMAL HIGH (ref 70–99)
Glucose-Capillary: 57 mg/dL — ABNORMAL LOW (ref 70–99)
Glucose-Capillary: 61 mg/dL — ABNORMAL LOW (ref 70–99)
Glucose-Capillary: 97 mg/dL (ref 70–99)

## 2010-08-17 LAB — CROSSMATCH: Antibody Screen: NEGATIVE

## 2010-08-17 LAB — CULTURE, BLOOD (ROUTINE X 2)
Culture: NO GROWTH
Culture: NO GROWTH
Culture: NO GROWTH
Report Status: 1172010

## 2010-08-17 LAB — DIFFERENTIAL
Basophils Absolute: 0 10*3/uL (ref 0.0–0.1)
Basophils Absolute: 0 10*3/uL (ref 0.0–0.1)
Basophils Relative: 0 % (ref 0–1)
Eosinophils Absolute: 0.1 10*3/uL (ref 0.0–0.7)
Eosinophils Absolute: 0.2 10*3/uL (ref 0.0–0.7)
Eosinophils Relative: 1 % (ref 0–5)
Eosinophils Relative: 2 % (ref 0–5)
Lymphocytes Relative: 13 % (ref 12–46)
Lymphocytes Relative: 5 % — ABNORMAL LOW (ref 12–46)
Lymphocytes Relative: 9 % — ABNORMAL LOW (ref 12–46)
Lymphs Abs: 0.6 10*3/uL — ABNORMAL LOW (ref 0.7–4.0)
Lymphs Abs: 1.1 10*3/uL (ref 0.7–4.0)
Lymphs Abs: 1.4 10*3/uL (ref 0.7–4.0)
Monocytes Absolute: 0.7 10*3/uL (ref 0.1–1.0)
Monocytes Absolute: 1 10*3/uL (ref 0.1–1.0)
Monocytes Relative: 5 % (ref 3–12)
Monocytes Relative: 9 % (ref 3–12)
Monocytes Relative: 7 % (ref 3–12)
Neutro Abs: 11.9 10*3/uL — ABNORMAL HIGH (ref 1.7–7.7)
Neutro Abs: 9.6 10*3/uL — ABNORMAL HIGH (ref 1.7–7.7)
Neutrophils Relative %: 90 % — ABNORMAL HIGH (ref 43–77)
Neutrophils Relative %: 80 % — ABNORMAL HIGH (ref 43–77)

## 2010-08-17 LAB — CARDIAC PANEL(CRET KIN+CKTOT+MB+TROPI)
CK, MB: 0.8 ng/mL (ref 0.3–4.0)
CK, MB: 0.8 ng/mL (ref 0.3–4.0)
CK, MB: 1 ng/mL (ref 0.3–4.0)
Relative Index: INVALID (ref 0.0–2.5)
Total CK: 13 U/L (ref 7–177)
Troponin I: 0.23 ng/mL — ABNORMAL HIGH (ref 0.00–0.06)

## 2010-08-17 LAB — URINE MICROSCOPIC-ADD ON

## 2010-08-17 LAB — URINE CULTURE

## 2010-08-18 LAB — URINALYSIS, ROUTINE W REFLEX MICROSCOPIC
Bilirubin Urine: NEGATIVE
Ketones, ur: NEGATIVE mg/dL
Leukocytes, UA: NEGATIVE
Nitrite: NEGATIVE
Urobilinogen, UA: 0.2 mg/dL (ref 0.0–1.0)

## 2010-08-18 LAB — COMPREHENSIVE METABOLIC PANEL
ALT: 14 U/L (ref 0–35)
Alkaline Phosphatase: 68 U/L (ref 39–117)
BUN: 17 mg/dL (ref 6–23)
CO2: 25 mEq/L (ref 19–32)
Calcium: 9 mg/dL (ref 8.4–10.5)
GFR calc non Af Amer: >60 mL/min (ref >60)
Glucose, Bld: 41 mg/dL — ABNORMAL LOW (ref 70–99)
Potassium: 4.3 mEq/L (ref 3.5–5.1)
Sodium: 136 mEq/L (ref 135–145)

## 2010-08-18 LAB — CBC
HCT: 35.7 % — ABNORMAL LOW (ref 36.0–46.0)
Hemoglobin: 12.1 g/dL (ref 12.0–15.0)
MCHC: 34 g/dL (ref 30.0–36.0)
RBC: 4.25 MIL/uL (ref 3.87–5.11)
RDW: 14.1 % (ref 11.5–15.5)

## 2010-08-18 LAB — HCG, QUANTITATIVE, PREGNANCY: hCG, Beta Chain, Quant, S: <2 m[IU]/mL (ref <5)

## 2010-08-18 LAB — GLUCOSE, CAPILLARY

## 2010-09-02 ENCOUNTER — Other Ambulatory Visit (HOSPITAL_COMMUNITY): Payer: Self-pay | Admitting: Family Medicine

## 2010-09-02 DIAGNOSIS — M549 Dorsalgia, unspecified: Secondary | ICD-10-CM

## 2010-09-02 DIAGNOSIS — R8290 Unspecified abnormal findings in urine: Secondary | ICD-10-CM

## 2010-09-04 ENCOUNTER — Ambulatory Visit (HOSPITAL_COMMUNITY)
Admission: RE | Admit: 2010-09-04 | Discharge: 2010-09-04 | Disposition: A | Discharge status: Home / Self Care | Payer: Medicaid Other | Source: Ambulatory Visit | Attending: Family Medicine | Admitting: Family Medicine

## 2010-09-04 DIAGNOSIS — R1031 Right lower quadrant pain: Secondary | ICD-10-CM | POA: Insufficient documentation

## 2010-09-04 DIAGNOSIS — R9389 Abnormal findings on diagnostic imaging of other specified body structures: Secondary | ICD-10-CM | POA: Insufficient documentation

## 2010-09-04 DIAGNOSIS — R8290 Unspecified abnormal findings in urine: Secondary | ICD-10-CM

## 2010-09-04 DIAGNOSIS — F17200 Nicotine dependence, unspecified, uncomplicated: Secondary | ICD-10-CM | POA: Insufficient documentation

## 2010-09-04 DIAGNOSIS — E119 Type 2 diabetes mellitus without complications: Secondary | ICD-10-CM | POA: Insufficient documentation

## 2010-09-04 DIAGNOSIS — M549 Dorsalgia, unspecified: Secondary | ICD-10-CM | POA: Insufficient documentation

## 2010-09-04 IMAGING — US US RENAL
1 series · 14 of 22 positions shown · non-contrast
Comparison: Abdominal ultrasound [DATE]

CLINICAL DATA: Abnormal urine, back and right flank pain, question
kidney stones; history diabetes, smoker

RENAL/URINARY TRACT ULTRASOUND COMPLETE

[Series 1: us renal · 0.33mm/px · 14 of 22 slices shown]
[im 1/22]
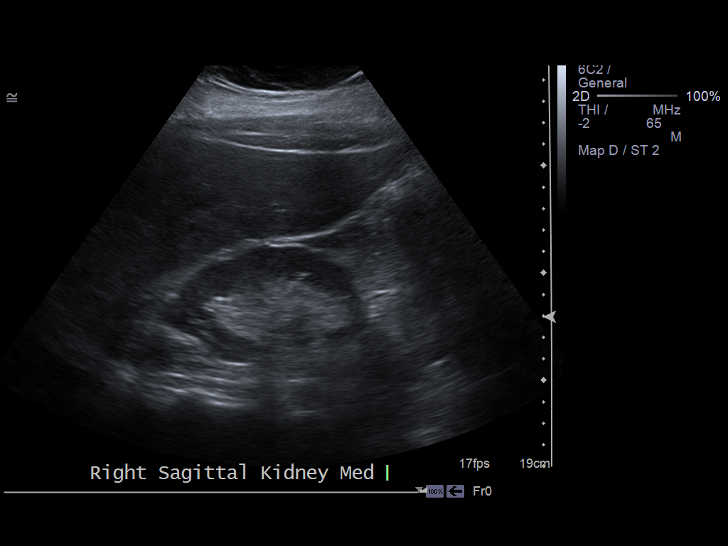
[im 3/22]
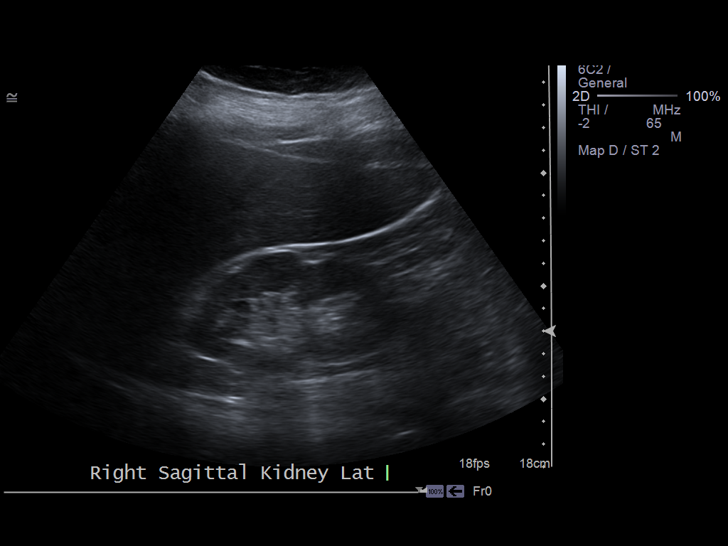
[im 4/22]
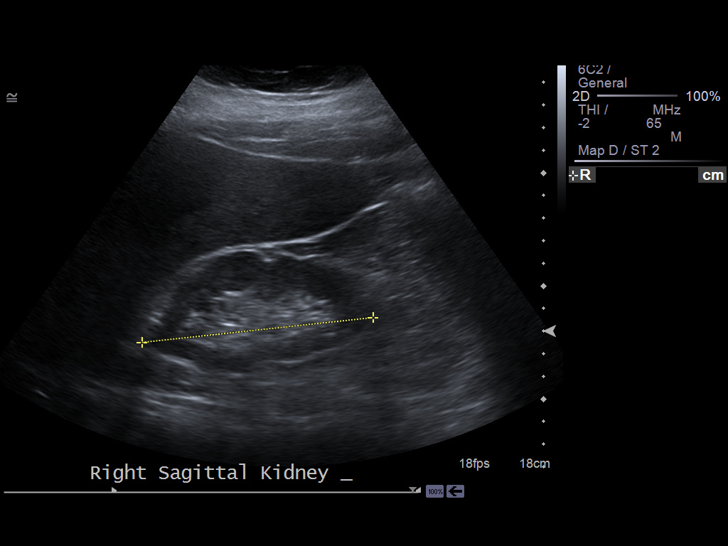
[im 6/22]
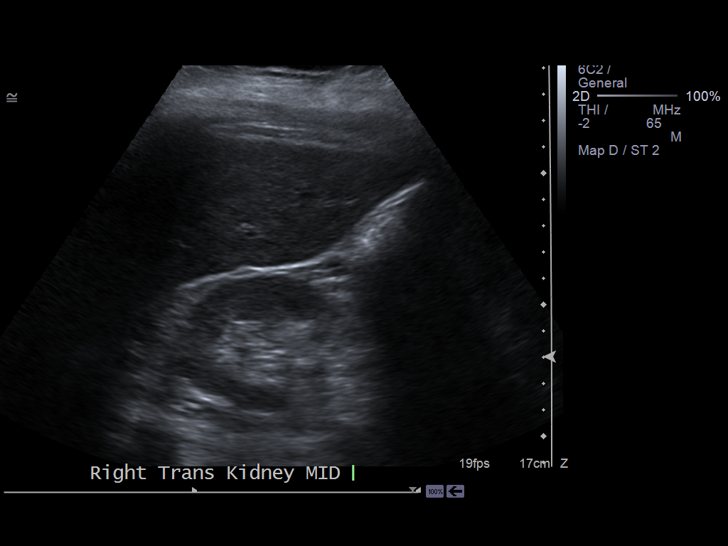
[im 8/22]
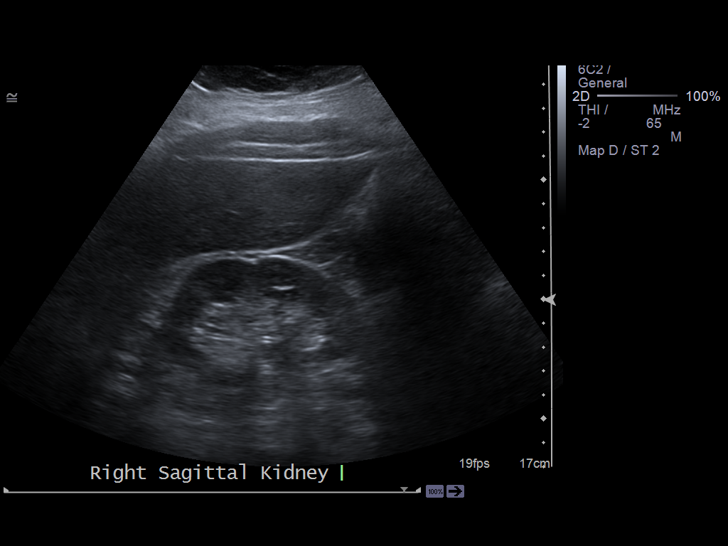
[im 9/22]
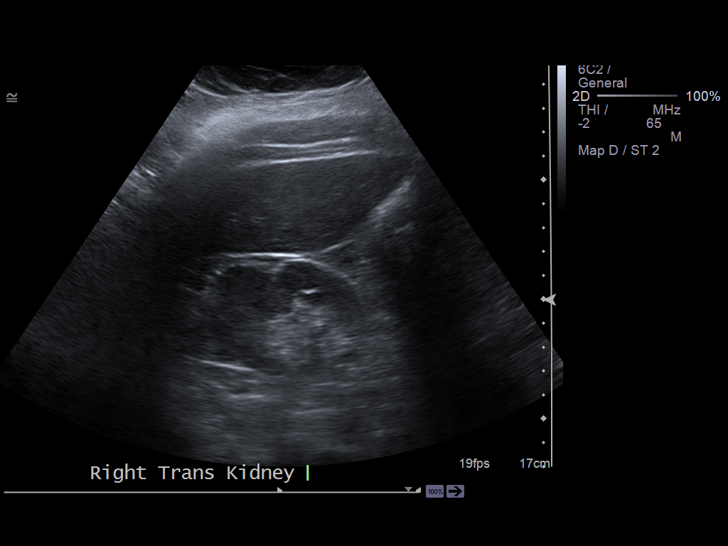
[im 11/22]
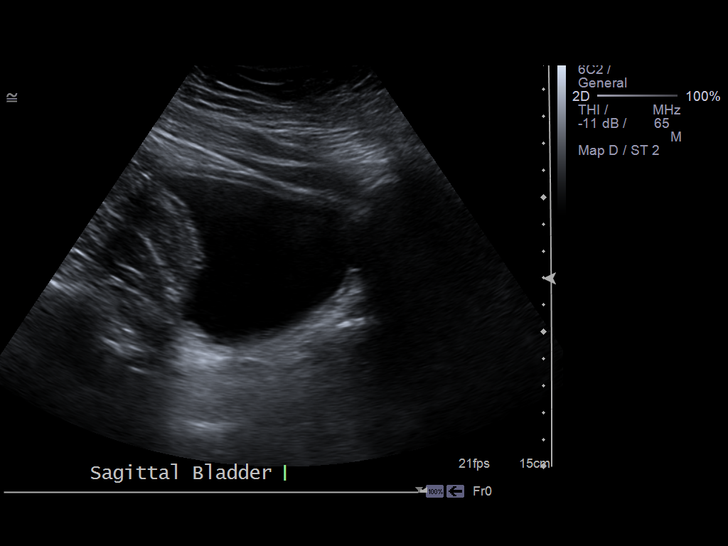
[im 12/22]
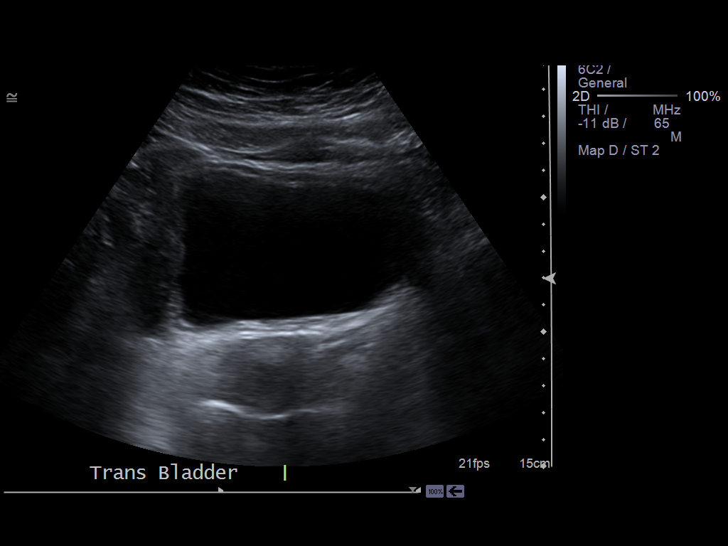
[im 14/22]
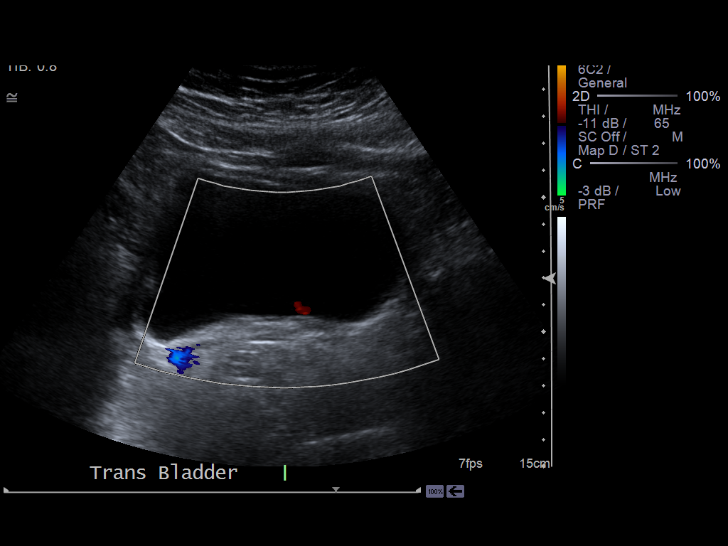
[im 15/22]
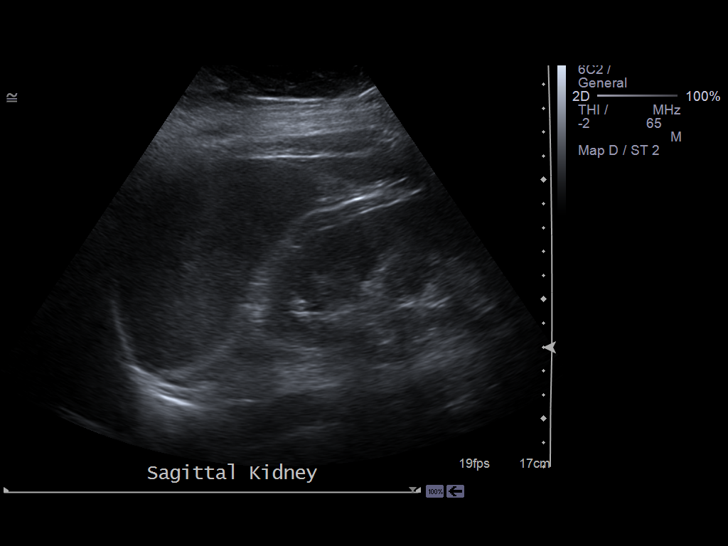
[im 17/22]
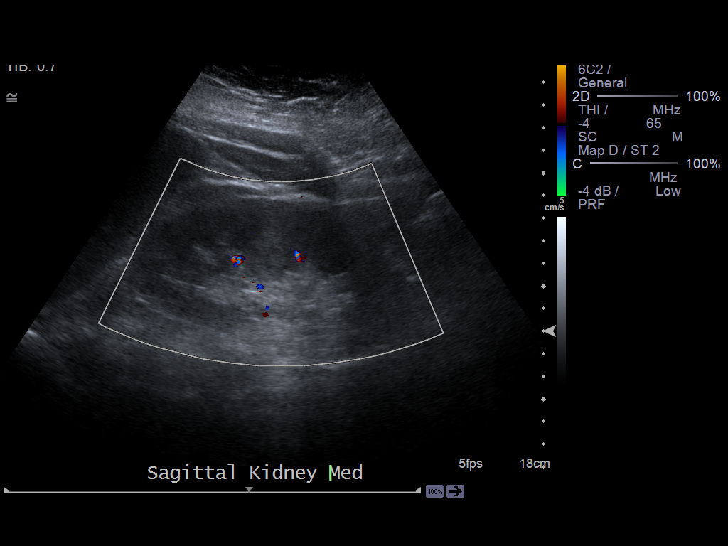
[im 19/22]
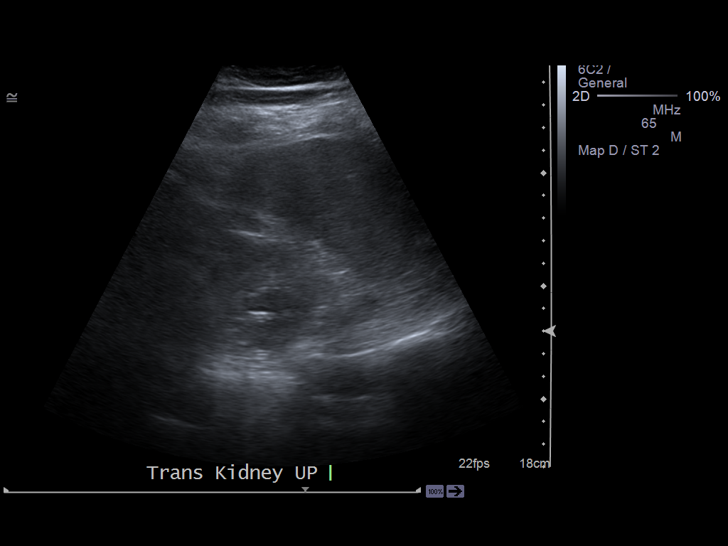
[im 20/22]
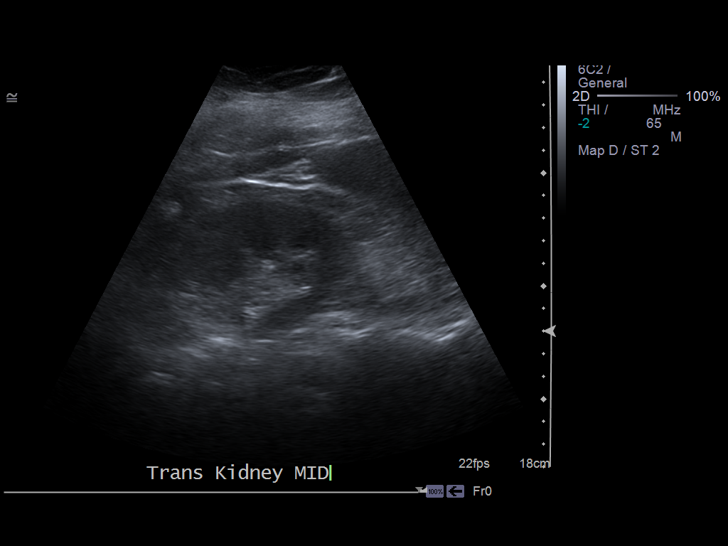
[im 22/22]
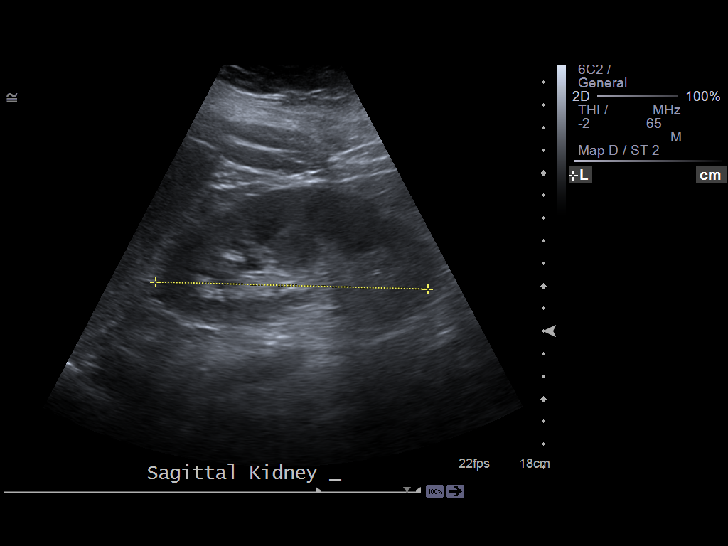

[14 of 22 positions shown; findings below may reference images not displayed]

FINDINGS: Right Kidney:  10.3 cm length.  Normal cortical thickness and
echogenicity.  No mass or hydronephrosis.  Nonshadowing echogenic
focus at mid right kidney 8 mm diameter, uncertain etiology,
nonshadowing calculus not excluded.  No perinephric fluid.

Left Kidney:  12.1 cm length.  Normal cortical thickness and
echogenicity.  No mass, hydronephrosis or shadowing calcification.
No perinephric fluid.

Bladder:  Normal appearance.  Bilateral ureteral jets noted.
IMPRESSION: Normal-appearing left kidney and bladder.
No evidence of right renal mass or hydronephrosis.
Nonshadowing echogenic focus at mid right kidney, 8 mm diameter,
uncertain etiology, calculus not excluded.

## 2010-09-15 NOTE — Discharge Summary (Signed)
Lauren [name], Lauren [name]             ACCOUNT NO.:  [id number]   MEDICAL RECORD NO.:  [id number]          PATIENT TYPE:  INP   LOCATION:  [id number]                          FACILITY:  [facility]   PHYSICIAN:  Emily Filbert, MD        DATE OF BIRTH:  01/24/1981   DATE OF ADMISSION:  03/04/2008  DATE OF DISCHARGE:  03/11/2008                               DISCHARGE SUMMARY   ADMISSION DIAGNOSES:  1. 32 and 2/7 weeks' estimated gestational age.  2. Class F diabetes mellitus, on an insulin pump.  3. Anemia.  4. Pregnancy-induced hypertension.  5. She is a smoker.  6. Obesity.  7. Hypothyroidism.  8. Urinary tract infection.   DISCHARGE DIAGNOSES:  1. 32 and 2/7 weeks' estimated gestational age.  2. Class F diabetes mellitus on an insulin pump.  3. Anemia.  4. Pregnancy-induced hypertension.  5. She is a smoker.  6. Obesity.  7. Hypothyroidism.  8. Urinary tract infection.   CONDITION:  Guarded.  The patient is signing out against medical advice.   HISTORY OF PRESENT ILLNESS AND HOSPITAL COURSE:  Ms. Dews is a 30-year-  old married white gravida 4, para 1-0-2-1 at 32 and 2 weeks' estimated  gestational age, who was seeing maternal fetal medicine physicians for  evaluation of her fetus, during her first evaluation there, her blood  pressures were noted to be elevated, she was sent to the MAU for further  evaluation.  In the MAU, her blood pressures ranged in the 132/90 range  to 159/100 range, and she was admitted for control of her blood  pressures.  Please note that a recent 24-hour urine showed over 4600 mg  of protein.  As noted, she is on an insulin pump with reasonable glucose  control.  An ultrasound was done on admission, which showed the baby's  growth to be at the 83rd percentile with normal amniotic fluid.  During  her hospital stay, serial labs were drawn and liver function tests were  normal.  She was noted to have a hemoglobin of 10.  Her platelets were  normal throughout her  stay, sugars were relatively good, another 24-hour  urine was done, which showed total protein of greater than 8 g.  Her  blood pressure were in the 140s over 80s to 100s during her stay.  She  was admitted, she was started on magnesium for 24 hours.  Social service  consult was obtained.  She was followed with fetal monitoring throughout  the week that she was here, but by the day of March 11, 2008, she was  essentially fed up with being in the hospital.  I personally had a long  discussion with her.  On the morning of March 11, 2008, I discussed  the hazards of the eclampsia and preeclampsia.  We  discussed the risk of the maternal and fetal death, and she and husband  were adamant that they were leaving.  They did agree to see Dr. Elonda Husky  during the week of discharge.  She will come back to the hospital if her  condition worsens or she  agrees to be admitted again.      Emily Filbert, MD  Electronically Signed     MCD/MEDQ  D:  04/05/2008  T:  04/05/2008  Job:  [id number]

## 2010-09-15 NOTE — H&P (Signed)
NAME:  Lauren [name], Lauren [name]             ACCOUNT NO.:  [id number]   MEDICAL RECORD NO.:  [id number]          PATIENT TYPE:  AMB   LOCATION:  DAY                           FACILITY:  [facility]   PHYSICIAN:  Jamesetta So, M.D.  DATE OF BIRTH:  [birth year]   DATE OF ADMISSION:  DATE OF DISCHARGE:  LH                              HISTORY & PHYSICAL   CHIEF COMPLAINT:  Cholecystitis, cholelithiasis.   HISTORY OF PRESENT ILLNESS:  The patient is a 30 year old white female  who is referred for evaluation treatment biliary colic secondary to  cholelithiasis.  She has been having intermittent right upper quadrant  abdominal pain with radiation to the flank, nausea, and indigestion for  the past few months.  It is made worse with fatty foods.  No fever,  chills, jaundice have been noted.   PAST MEDICAL HISTORY:  Includes insulin-dependent diabetes mellitus,  morbid obesity, hypothyroidism.   PAST SURGICAL HISTORY:  Cyst removed from legs.   CURRENT MEDICATIONS:  Humalog insulin pump, Levothroid, Chantix.   ALLERGIES:  No known drug allergies.   REVIEW OF SYSTEMS:  Noncontributory.   PHYSICAL EXAMINATION:  CONSTITUTIONAL:  The patient is obese white  female in no acute distress.  HEENT:  Examination reveals no scleral icterus.  LUNGS:  Clear to auscultation with equal breath sounds bilaterally.  HEART:  Examination reveals a regular rate and rhythm without S3, S4, or  murmurs.  ABDOMEN:  The abdomen is soft and nondistended.  She is tender in the  right upper quadrant to palpation.  No hepatosplenomegaly, masses,  hernias are identified.   DIAGNOSTIC STUDIES:  Ultrasound gallbladder reveals cholelithiasis  versus polyps.  HIDA scan reveals a low gallbladder ejection fraction  with reproducible symptoms.   IMPRESSION:  Cholecystitis, cholelithiasis.   PLAN:  The patient is scheduled for laparoscopic cholecystectomy on November 06, 2008.  Risks and benefits of the procedure including bleeding,  infection, hepatobiliary injury, and the possibly of an open procedure  were fully explained to the patient, gave informed consent.      Jamesetta So, M.D.  Electronically Signed     MAJ/MEDQ  D:  11/05/2008  T:  11/05/2008  Job:  [id number]   cc:   Estill Bamberg. Karie Kirks, M.D.  Fax: [phone]

## 2010-09-15 NOTE — Group Therapy Note (Signed)
Lauren [name], Lauren [name]             ACCOUNT NO.:  [id number]   MEDICAL RECORD NO.:  [id number]         PATIENT TYPE:  PINP   LOCATION:  A308                          FACILITY:  [facility]   PHYSICIAN:  Estill Bamberg. Karie Kirks, M.D.DATE OF BIRTH:  Dec 26, 1980   DATE OF PROCEDURE:  DATE OF DISCHARGE:                                 PROGRESS NOTE   SUBJECTIVE:  She is still having a lot of pain in the right flank.  Just  does not feel well.  She is having sweats and chills still.   OBJECTIVE:  VITAL SIGNS:  Temperature 98.9, pulse 132, respiratory rate  20, and blood pressure 159/91.  GENERAL:  She is somewhat recumbent in bed, well-developed, well-  nourished, oriented, alert, but in distress, due to right CV and flank  pain.  HEART:  Regular rhythm at a rate of about 100, supine.  LUNGS:  Clear throughout.  There is still fairly severe tenderness on  punch of the right CVA flank, but not in the left CVA or flank.   Sugars today have been 220-170.  O2 sats 98%.  White cell count 10,800,  hemoglobin is still low at 8.6, potassium 3.2, and glucose 190.  Her  urine grew out greater 1000 E. coli.  Sensitive to everything tested.   ASSESSMENT:  1. Escherichia coli pyelonephritis.  2. Insulin-dependent diabetes, controlled with a pump.  3. Hypokalemia.  4. Anemia.   PLAN:  Type and cross for 2 units, transfuse 1 today with a post-  transfusion H and H.  Increase KCl from 20 daily to 20 b.i.d.  Start her  on oxycodone and APAP 10/650 q.i.d. round-the-clock, and then just use  her IV Dilaudid for breakthrough pain.  Recheck a CBC and MET-7 in the  morning.      Estill Bamberg. Karie Kirks, M.D.  Electronically Signed     SDK/MEDQ  D:  05/17/2008  T:  05/17/2008  Job:  [id number]

## 2010-09-15 NOTE — Group Therapy Note (Signed)
Lauren [name], Lauren [name]             ACCOUNT NO.:  [id number]   MEDICAL RECORD NO.:  [id number]          PATIENT TYPE:  INP   LOCATION:  A308                          FACILITY:  [facility]   PHYSICIAN:  Estill Bamberg. Karie Kirks, M.D.DATE OF BIRTH:  11/28/1980   DATE OF PROCEDURE:  DATE OF DISCHARGE:                                 PROGRESS NOTE   SUBJECTIVE:  She is still having back pain, really unchanged from  yesterday.   OBJECTIVE:  Temperature 0.3, pulse 113, respiratory rate 20, blood  pressure 131/67.  She is supine in bed and dressed due to back pain.  She is well-developed, well-nourished, oriented and alert.  Skin color  is good.  Skin turgor normal.  Mucous membranes moist.  Lungs are clear  throughout.  Heart is a regular rhythm rate about 120 supine on  auscultation.  The abdomen is soft without organomegaly or mass.  She  does have mild tenderness in the right lower quadrant, little bit left  lower quadrant, and fairly severe tenderness in the right and left CVAs  and flanks.   Today's white cell count is 10,200 of with 79% neutrophils, 10 lymphs.  Her hemoglobin was 8.9 down from 10.6 yesterday.  Her BUN is 16 down  from 44 yesterday, creatinine 1.38 down from 2.32 yesterday, potassium  3.3, glucose 190.   ASSESSMENT:  1. Pyelonephritis.  2. Insulin-dependent diabetes which is fairly well controlled.  3. Dehydration, cleared.  4. Anemia some which may be delusional.  5. Hypokalemia.   PLAN:  Asked Dr. Mallory Shirk, GYN, to see her since she delivered  about 5 weeks ago.  He concurs with the pyelonephritis diagnosis  according to nursing.  We will cut IVs back from 150 to 100 an hour,  recheck a CBC, BMP the morning.  Give potassium supplementation.  Add  Rocephin 1 g IV q.24 h. and she has no allergies.  Continue to treat her  diabetes with insulin pump.  Discussed all this at length with the  patient and her mom.  Discussed with nursing.      Estill Bamberg. Karie Kirks,  M.D.  Electronically Signed     SDK/MEDQ  D:  05/15/2008  T:  05/15/2008  Job:  [id number]

## 2010-09-15 NOTE — Op Note (Signed)
NAME:  Lauren [name], Lauren [name]             ACCOUNT NO.:  [id number]   MEDICAL RECORD NO.:  [id number]          PATIENT TYPE:  AMB   LOCATION:  DAY                           FACILITY:  [facility]   PHYSICIAN:  Florian Buff, M.D.   DATE OF BIRTH:  1980-10-27   DATE OF PROCEDURE:  06/12/2008  DATE OF DISCHARGE:                               OPERATIVE REPORT   PREOPERATIVE DIAGNOSES:  1. Multiparous female, desires permanent sterilization.  2. Insulin-dependent diabetes.   POSTOPERATIVE DIAGNOSES:  1. Multiparous female, desires permanent sterilization.  2. Insulin-dependent diabetes.   PROCEDURE:  Laparoscopic tubal ligation using electrocautery.   SURGEON:  Florian Buff, MD   ANESTHESIA:  General endotracheal.   FINDINGS:  The patient has normal uterus, tubes, and ovaries.  No  abnormalities.   DESCRIPTION OF OPERATION:  The patient was taken to the operating room,  placed in the supine position, where she underwent general endotracheal  anesthesia, placed in lithotomy position, prepped and draped in usual  sterile fashion.  Hulka tenaculum was placed for uterine manipulation.  Incision made in the umbilicus.  Veress needle was used.  A non-bladed  trocar was then placed under direct visualization without difficulty.  Both tubes were identified, burned with no resistance up beyond using  the  electrocautery unit without difficulty.  There was good hemostasis.  The instruments were removed, the fascia was closed.  Skin was closed  using skin staples.  She did have a little bit of bleeding from the  vagina, looked at the cervix, with Hulka tenaculum, was cauterized with  Bovie.  Otherwise, she did well with minimal bleeding.  She was taken to  the recovery room in good and stable condition.  All counts were  correct.      Florian Buff, M.D.  Electronically Signed     LHE/MEDQ  D:  06/12/2008  T:  06/12/2008  Job:  [id number]

## 2010-09-15 NOTE — Group Therapy Note (Signed)
NAMECORAZON, BONO             ACCOUNT NO.:  [id number]   MEDICAL RECORD NO.:  [id number]          PATIENT TYPE:  INP   LOCATION:  A308                          FACILITY:  [facility]   PHYSICIAN:  Estill Bamberg. Karie Kirks, M.D.DATE OF BIRTH:  [birth year]   DATE OF PROCEDURE:  05/18/2008  DATE OF DISCHARGE:                                 PROGRESS NOTE   SUBJECTIVE:  She does feel better today, little bit stronger.  She still  has some pain in the right back.  She is on oxycodone and APAP 10/650  q.i.d. and tells me she did not use any IV Dilaudid yesterday since the  pain was well controlled.  The patient notes that her pump needle came  out yesterday and she did not noticed that initially and after then her  sugars went up.   OBJECTIVE:  VITAL SIGNS:  Temperature 98.3, pulse 115, respiratory rate  18, and blood pressure 133/69.  GENERAL:  She is sitting in bed.  She is well-developed, somewhat obese.  No acute distress, oriented, and alert.  LUNGS:  Clear throughout.  HEART:  A regular rhythm at rate of about 110, sitting.  EXTREMITIES:  She still has pain on punch of the right CVA and flank,  but it is better than yesterday and there is no pain on punch of the  left CVA and flank, but there is no edema of the ankles.   Her white cell count today is 13,100, which 80% neutrophils and 11  lymphs.  H and H 10.9/33.3, similar to the 10.7/32.2 yesterday, after  her transfusion.  Her sodium is 131, bicarb is 16, BUN 11, creatinine  1.54, and the glucose is 566.  Glucoses have been 403, 465, 431, and 515  in the last day.   ASSESSMENT:  1. Escherichia coli pyelonephritis of the right kidney.  2. Poorly controlled insulin-dependent diabetes.  3. Anemia, improved after a unit of packed cells.  4. Pain, controlled.   PLAN:  Recheck a CBC and BMP in 2 days.  Meanwhile, she is to be up and  around.  Continue with IV antibiotics of Cipro and Rocephin.  She is  back on the pump, which should help  control her sugars better.  Hopefully, discharge in 2 days.      Estill Bamberg. Karie Kirks, M.D.  Electronically Signed     SDK/MEDQ  D:  05/18/2008  T:  05/18/2008  Job:  [id number]

## 2010-09-15 NOTE — Consult Note (Signed)
Lauren [name], MIRO             ACCOUNT NO.:  [id number]   MEDICAL RECORD NO.:  [id number]          PATIENT TYPE:  INP   LOCATION:  A308                          FACILITY:  [facility]   PHYSICIAN:  Jonnie Kind, M.D. DATE OF BIRTH:  1980-08-10   DATE OF CONSULTATION:  DATE OF DISCHARGE:  05/20/2008                                 CONSULTATION   CHIEF COMPLAINT:  1. Suspected pyelonephritis in a recently postpartum patient.  2. Diabetes mellitus.   HISTORY OF PRESENT ILLNESS:  This is a 30 year old multiparous female,  now 5 weeks status post vaginal delivery, was admitted on May 13, 2008, with severe right-sided flank pain, after presenting to the  emergency room in [facility].  Pregnancy was notable for class  F diabetes mellitus managed with insulin pump, prior pregnancy-induced  hypertension, anemia, hyperthyroidism, and recurrent urinary tract  infections.  She was delivered without difficulty at approximately [redacted]  weeks gestation.  Prenatal course has been notable for urinary tract  infection, managed by outpatient therapy not requiring admission.  She  was seen in the office for routine postpartum visit on May 11, 2007,  and had a completely normal pelvic exam at that point in time and no  suspicion of pelvic discomfort or pain.  She was complaining of a dull  headache, but was not febrile.  Urine status was not documented.  Three  days later, she was having fever and increasing pain and presented to  the emergency room here, where she was admitted for presumed  pyelonephritis.  CT of the abdomen showed suspected bilateral renal  enlargement, right greater than left with mild right hydronephrosis and  perinephric edema.  There were no renal stones noted.  There were mildly  enlarged retroperitoneal lymph nodes, unfortunately the clinical  significance is not fully determined.   Since admission on May 13, 2008, she has been slow to responded to  the  antibiotics.  She was initially on Cipro, a good choice covering a  wide spectrum of possible organisms.  The urine culture on March 16, 2008, has returned showing 10-15 E. coli sensitive to all antibiotics  including cefazolin, Rocephin, and quinolones including Cipro.  She has  only gradually begun to improve in the last 24 hours having persistent  elevated temperatures in the 100 to 101 range until the morning of  May 15, 2008, had a brief temperature spike 103.5 at 17:50 on  May 15, 2008, and has remained in the 98 to 100 range since then.  Pulse is improved down into the 100 range down from the 113 to 129 range  early on admission.   PHYSICAL EXAMINATION:  GENERAL:  Mild CVA tenderness, moderately  uncomfortable, obese, Caucasian female alert and oriented x3.  ABDOMEN:  Without guarding or rebound.  EXTERNAL GENITALIA:  Normal.  PELVIC:  In light of the recently normal pelvic in the office just  before admission, we will not repeat.  Urinalysis as stated earlier and  has grown 10-15 E. coli.   IMPRESSION:  No evidence of postpartum pelvic abnormality to  pyelonephritis currently treated  with Cipro and Rocephin double  coverage, responding but slowly.   PLAN:  I agree with Dr. Vickey Sages plan to continue antibiotics for 1  more day and then discharge to outpatient management.  She would be a  perfectly reasonable candidate for Cipro or Ancef for a postop followup.  Questionable renal stone may need to be followed up later to eliminate  the risk of chronic infection within the stone itself, which would lead  to recurrent infections in this vulnerable diabetic.   Thank you for the opportunity to see this patient with you.      Jonnie Kind, M.D.  Electronically Signed     JVF/MEDQ  D:  05/16/2008  T:  05/17/2008  Job:  [id number]

## 2010-09-15 NOTE — Group Therapy Note (Signed)
Lauren [name], Lauren [name]             ACCOUNT NO.:  [id number]   MEDICAL RECORD NO.:  [id number]          PATIENT TYPE:  INP   LOCATION:  A308                          FACILITY:  [facility]   PHYSICIAN:  Estill Bamberg. Karie Kirks, M.D.DATE OF BIRTH:  Oct 10, 1980   DATE OF PROCEDURE:  DATE OF DISCHARGE:                                 PROGRESS NOTE   The patient does feel somewhat better.  Still having fairly severe right  CVA and back pain, however.   OBJECTIVE:  VITAL SIGNS:  Her temperature is 100 degrees, pulse 126,  respiratory rate 20, blood pressure 145/83.  GENERAL:  She is sitting in bed.  She is distress just to the right back  pain.  She is well developed, well nourished, oriented, alert.  LUNGS:  Clear throughout, moving air well.  HEART:  Regular rhythm, rate of about 90, sitting.  She still does have  fairly severe pain on punch to the right CVA, flank and minimal on the  left CVA and flank.  ABDOMEN:  There is minimal suprapubic tenderness.  Abdomen is otherwise  soft.   Today, the white cell count is 11,900 with 81% neutrophils, 9 lymphs.  Her H&H is 9.2, 27.9, glucose 156, BUN 10, creatinine 1.24.  O2 sats 95%  room air.  The glucose is 258.   ASSESSMENT:  1. Pyelonephritis.  2. Insulin-dependent diabetes, fairly well controlled.   PLAN:  We will discontinue her Foley and IV, switch to Hep-Lock and get  her up and moving today.  Recheck a CBC and a BMP in the morning.  I  think part of the anemia is dilutional and that hemoglobins already got  better since yesterday.  If her pain can be controlled with p.o.  medications, we will probably be able to send her home tomorrow.  Anemia, which is probably partly dilutional, partly status post  delivery.      Estill Bamberg. Karie Kirks, M.D.  Electronically Signed     SDK/MEDQ  D:  05/16/2008  T:  05/17/2008  Job:  [id number]

## 2010-09-15 NOTE — Discharge Summary (Signed)
Lauren [name], Lauren [name]             ACCOUNT NO.:  [id number]   MEDICAL RECORD NO.:  [id number]          PATIENT TYPE:  INP   LOCATION:  A308                          FACILITY:  [facility]   PHYSICIAN:  Estill Bamberg. Karie Kirks, M.D.DATE OF BIRTH:  [birth year]   DATE OF ADMISSION:  05/13/2008  DATE OF DISCHARGE:  01/18/2010LH                               DISCHARGE SUMMARY   This 30 year old admitted with acute pyelonephritis.  She had a benign 7-  day hospitalization extending from May 14, 2008 to May 20, 2008.   Admission white cell count was 13,300 of which 9% neutrophils, 5 lymphs.  Hemoglobin 10.6.  Her BMP showed glucose 375, BUN 44, creatinine 2.32.  HCG was negative.   Cardiac panel was normal, except for troponin of 0.25 then dropped to  0.3 and 0.21.  Other markers were normal.  Repeat CBC showed a white  cell count of 10,200, but the hemoglobin dropped to 8.9.  Repeat BMET  showed potassium 3.3, glucose 190, BUN now down to 16, creatinine 1.38.  Her urine culture grew out greater than 100,000 E. coli sensitive to  everything tested.  White cell count stabilized, hemoglobin repeat was  9.2 and 8.6 and after transfusion went up to 10.9.  The day of discharge  her white cell count is down  to 11,400.  Hemoglobin 10.7, platelet  count had gradually crept up to 400,000 then to 817,000 at the time of  discharge and had BMP showed potassium 4.6, glucose 121.   Admission chest x-ray showed bronchitic changes.  CT of the abdomen  showed bilateral renal enlargement with the right greater than left with  mild right hydronephrosis and perinephric edema.  She also had the  gallbladder distended with dependent calculi, questionable wall  thickening and she had bibasilar atelectasis with tiny nodular close at  lung bases.  There were some mild was retroperitoneal lymph nodes as  well.   She was admitted to the hospital, started on IV of normal saline a 1000  mL bolus, then 150 mL an hour.   She was put on a diabetic diet with Accu-  Chek a.m. and evening with sliding scale insulin coverage.  In fact, we  used her insulin pump most for hospitalization.  She had I and O, put on  p.r.n. Zofran, morphine, given Synthroid 75 mcg a day, folic acid 4 mg  daily.  She was given Cipro 400 mg IV daily.  Later that day was given a  bolus 1000 mL of normal saline then since she was still somewhat  orthostatic and then after that IVs resumed at 150 mL an hour.  I asked  Jonnie Kind, M.D., her GYN, to see her in consult and he agreed  that pyelonephritis was the diagnosis.  I then added Rocephin a gram IV  q.24h.  her second day and gave her KCl 20 mg daily for her hypokalemia.  This later had to be increased to KCl 20 mEq b.i.d.  By her third day  however, she was doing better, taking well by mouth.  Blood pressure  back to normal so  her IV was discontinued as was her Foley.  She was  given Hep-Lock and up in a chair.  The following day when it was  discovered that she was still anemic she was given a unit of packed  cells, which brought up her hemoglobin nicely.  She was given  oxycodone/APAP 10/650 q.i.d. for the pain of her pyelo.   She was much improved and ready for discharge home on her seventh day.   FINAL DISCHARGE DIAGNOSES:  1. Right pyelonephritis.  2. Uncontrolled type 1 diabetes mellitus.  3. Hypokalemia.  4. Recent vaginal delivery.  5. Cholelithiasis noted on CT scanning.  6. Nonspecific bibasilar pulmonary nodular foci, question etiology.   She is discharged home:  1. Using her insulin pump, which has worked extremely well for her.  2. She is to take Levaquin 500 mg daily for 10 days.  3. Oxycodone/APAP 10/650 q.i.d. for pain 30 no refills.   Follow-up in the office within the week.  Repeat CT scan of the abdomen  and dedicated CT scan of the lung will be arranged at that time.      Estill Bamberg. Karie Kirks, M.D.  Electronically Signed     SDK/MEDQ  D:   05/20/2008  T:  05/20/2008  Job:  [id number]

## 2010-09-15 NOTE — H&P (Signed)
NAMEPERFECTA, DUFAULT             ACCOUNT NO.:  [id number]   MEDICAL RECORD NO.:  [id number]          PATIENT TYPE:  INP   LOCATION:  A308                          FACILITY:  [facility]   PHYSICIAN:  Estill Bamberg. Karie Kirks, M.D.DATE OF BIRTH:  October 26, 1980   DATE OF ADMISSION:  05/13/2008  DATE OF DISCHARGE:  LH                              HISTORY & PHYSICAL   This 30 year old came to the emergency room the night of admission with  severe pain in the right side.  She had fever and chills and just has  not felt well for the week before this.  Also, she had sugars higher  than usual.   She is insulin-dependent diabetic currently on the pump.  Pump kept her  A1c's in the eight range during her pregnancy.  She delivered vaginally  on April 06, 2008, she and the baby done well since then.   She also has a history of hypothyroidism, urinary tract infection.  She  is insulin-dependent diabetic for years.  The patient note she had  follow up in Dr. Brynda Greathouse office and Pap smear and internal  within 3-4  weeks after her delivery and things were all normal as far she knew.  She had little bit of vaginal spotting since the delivery, but this has  gotten better as well.   FAMILY HISTORY:  Positive for hypertension, diabetes, and cancer.  Does  not drink, smoke, or use drugs.   CURRENT MEDICATION:  Synthroid 75 mcg a day, folic acid 4 tablets daily,  Humalog sliding scale by insulin pump.   REVIEW OF SYSTEMS:  Essentially normal and also documented on the ED  physician report except for the severe pain in the low back, right worse  than left, fever and chills as noted, generalized malaise, and fatigue.   PHYSICAL EXAMINATION:  VITAL SIGNS:  Admission exam showed a pleasant  young woman whose blood pressure is 101/77, pulse of 163, respiratory  rate 20, temperature 97.8, O2 sat was 98% on room air.  In the ER, it  was noted that her sitting blood pressure was 111/67 with a pulse of  129, standing  blood pressure 88/56, pulse of 136.  GENERAL:  On my exam, she is oriented and alert.  No acute distress and  by the morning after admission, she was sitting up without dizziness and  blood pressure got up to the 114/70 range.  SKIN:  Skin turgor appeared to be normal.  HEENT:  Mucous membranes are moist.  HEART:  Regular rhythm at rate of 90.  LUNGS:  Clear throughout moving air well.  ABDOMEN:  Soft without organomegaly or mass, but she did have some mild  lower abdominal tenderness.  She had definite CVA tenderness, right  worse than left.  No flank pain.  EXTREMITIES:  There is no edema of the ankles or feet.   LABORATORY DATA:  The patient's admission white cell count was 13,300 of  which 90% were neutrophils.  Her hemoglobin is 10.6.  Troponin 0.5.  Sodium 130, chloride 92, glucose 375, BUN 44, creatinine 2.32.  Urine  showed greater than  300 glucose, 11-20 wbc's per HPF, 3-6 rbc's,  specific gravity 1.025, and trace ketones.  Chest x-ray showed  bronchitic changes.   I obtained a abdominal CT scan on her today and this showed  cholelithiasis and questionable early gallbladder wall thickening, a  definite bilateral renal enlargement, asymmetric enlargement,  perinephric edema at the right kidney, questionably associated with mild  proximal ureteral dilatation.  She also has a nonspecific mildly  enlarged retroperitoneal lymph nodes and some nonspecific bibasilar  pulmonary nodular foci.   Reviewing of her notes overnight showed that her temperature went up to  103.   ADMISSION DIAGNOSES:  1. Pyelonephritis.  2. Insulin-dependent diabetes.  3. Electrolyte abnormalities.  4. Recent pregnancy.  5. Cholelithiasis.  6. Abnormal CT of the lung.   She is already on Cipro IV and on IV fluids.  We will continue her on  her insulin pump since this worked so well at home.  Urine C&S is  pending.  We will plan to get the dedicated CT of the chest recommended  by Radiology, but  this could be done as an outpatient.      Estill Bamberg. Karie Kirks, M.D.  Electronically Signed     SDK/MEDQ  D:  05/14/2008  T:  05/15/2008  Job:  [id number]

## 2010-09-15 NOTE — Op Note (Signed)
Lauren [name], Lauren [name]             ACCOUNT NO.:  [id number]   MEDICAL RECORD NO.:  [id number]          PATIENT TYPE:  OIB   LOCATION:  A320                          FACILITY:  [facility]   PHYSICIAN:  Jamesetta So, M.D.  DATE OF BIRTH:  02-03-1981   DATE OF PROCEDURE:  11/06/2008  DATE OF DISCHARGE:  11/06/2008                               OPERATIVE REPORT   PREOPERATIVE DIAGNOSES:  Cholecystitis, cholelithiasis.   POSTOPERATIVE DIAGNOSES:  Cholecystitis, cholelithiasis.   PROCEDURE:  Laparoscopic cholecystectomy.   SURGEON:  Jamesetta So, MD   ANESTHESIA:  General endotracheal.   INDICATIONS:  The patient is a 30 year old white female who presents  with cholecystitis and possible cholelithiasis.  She does have a low  ejection fraction on HIDA scan.  The risks and benefits of the procedure  including bleeding, infection, hepatobiliary injury, and the possibly an  open procedure were fully explained to the patient, gave informed  consent.   PROCEDURE NOTE:  The patient was placed in the supine position.  After  induction of general endotracheal anesthesia, the abdomen was prepped  and draped using the usual sterile technique with Betadine.  Surgical  site confirmation was performed.   A supraumbilical incision was made down into the fascia.  A Veress  needle was introduced into the abdominal cavity and confirmation of  placement was done using the saline drop test.  The abdomen was then  insufflated to 16 mmHg pressure.  An 11-mm trocar was introduced into  the abdominal cavity under direct visualization without difficulty.  The  patient was placed in reverse Trendelenburg position.  An additional 11-  mm trocar was placed in the epigastric region, and 5-mm trocars were  placed in the right upper quadrant and right flank regions.  Liver was  inspected and noted to be within normal limits.  The gallbladder was  retracted superior and laterally.  The dissection was begun around  the  infundibulum of the gallbladder.  The cystic duct was first identified.  Its juncture to the infundibulum was fully identified.  EndoClips were  placed proximally and distally on the cystic duct, and the cystic duct  was divided.  This was likewise done in the cystic artery.  The  gallbladder was then freed away from the gallbladder fossa using Bovie  electrocautery.  The gallbladder was delivered through the epigastric  trocar site using EndoCatch bag.  The gallbladder fossa was inspected.  No abnormal bleeding or bile leakage was noted.  Surgicel was placed in  the gallbladder fossa.  All fluid and air were then evacuated from the  abdominal cavity prior to removal of the trocars.   All wounds were irrigated with normal saline.  All wounds were injected  with 0.5% Sensorcaine.  The supraumbilical fascia was reapproximated  using an 0 Vicryl interrupted suture.  All skin incisions were closed  using staples.  Betadine ointment and dry sterile dressings were  applied.   All tape and needle counts were correct at the end of the procedure.  The patient was extubated in the operating room and went back to  recovery  room awake in stable condition.   COMPLICATIONS:  None.   SPECIMEN:  Gallbladder.   BLOOD LOSS:  Minimal.      Jamesetta So, M.D.  Electronically Signed     MAJ/MEDQ  D:  11/06/2008  T:  11/07/2008  Job:  [id number]   cc:   Estill Bamberg. Karie Kirks, M.D.  Fax: [phone]

## 2010-09-18 NOTE — Discharge Summary (Signed)
NAMEJOAQUINA, Lauren [name]             ACCOUNT NO.:  [id number]   MEDICAL RECORD NO.:  [id number]           PATIENT TYPE:  INP   LOCATION:  A208                          FACILITY:  [facility]   PHYSICIAN:  Debbe Odea, M.D.     DATE OF BIRTH:  09/17/1939   DATE OF ADMISSION:  04/19/2005  DATE OF DISCHARGE:  12/20/2006LH                                 DISCHARGE SUMMARY   PRIMARY CARE PHYSICIAN:  Dr. Anitra Lauth.   DISCHARGE DIAGNOSES:  1.  Diabetic ketoacidosis.  2.  Gastroenteritis.  3.  Depression and anxiety.  4.  Trichomonas.  5.  Urinary tract infection.   DISCHARGE MEDICATIONS:  She can continue all of her home medications as  mentioned on her H&P from April 19, 2005.  Her only new medication is  Flagyl 500 mg b.i.d. for five days for Trichomonas.   HOSPITAL COURSE:  This is a 30 year old female who was admitted with  vomiting and diarrhea.  She was found to be in DKA, her pH was 7.1.  She was  started on IV insulin, she was maintained n.p.o., fluids were replaced IV,  she was given Zofran for nausea.  In addition she had a urine culture and  blood culture done to rule out infection, blood cultures were negative  however, her UA showed Trichomonas.  Urine culture was positive for Strep  Agalactiae.   The patient did well, her blood sugars improved adequately, and she was  switched to subcutaneous NovoLog and her home dose of Lantus.  She was  started on a regular diet which she tolerated well.  She was no longer  complaining of nausea or vomiting or abdominal pain.  She has been explained  that she has Trichomonas and her sexual partner needs to be treated as well.   BLOOD WORK:  On discharge white blood cell count 8.4, hemoglobin 13.2,  hematocrit 38, platelets 267, sodium 135, potassium 3.5, chloride 109,  bicarb 18, glucose 453 this morning, however, has improved.   The patient has had a nutrition consult to further advise her on her diet.   FOLLOWUP INSTRUCTIONS:  1.   Follow-up with Dr. Anitra Lauth in two weeks.  2.  Have partner tested for Trichomonas and then treated if positive.  3.  Continue an 1800 calorie diabetic diet.      Debbe Odea, M.D.  Electronically Signed     SR/MEDQ  D:  06/02/2005  T:  06/02/2005  Job:  [id number]

## 2010-09-18 NOTE — Op Note (Signed)
Lauren [name], Lauren [name]             ACCOUNT NO.:  [id number]   MEDICAL RECORD NO.:  [id number]          PATIENT TYPE:  AMB   LOCATION:  DAY                           FACILITY:  [facility]   PHYSICIAN:  Jamesetta So, M.D.  DATE OF BIRTH:  16-Dec-1980   DATE OF PROCEDURE:  08/21/2004  DATE OF DISCHARGE:                                 OPERATIVE REPORT   PREOPERATIVE DIAGNOSIS:  Abscess, right thigh.   POSTOPERATIVE DIAGNOSIS:  Abscess, right thigh.   PROCEDURE:  Incision and drainage, abscess, right thigh.   SURGEON:  Jamesetta So, M.D.   ANESTHESIA:  General.   INDICATIONS:  The patient is a 30 year old white female who is status post  incision and drainage of bilateral anterior thigh wounds.  Both were healing  well until recently when the right thigh wound began to swell and become  indurated.  The patient now comes to the operating room for incision and  drainage of the right thigh abscess in the operating room.  The risks and  benefits of the procedure were fully explained to the patient, who gave  informed consent.   PROCEDURE NOTE:  The patient was placed in a supine position.  After general  anesthesia was administered, the right anterior thigh was prepped and draped  using the usual sterile technique with Betadine.  Surgical site confirmation  was performed.   A previous wound was extended.  A large subcutaneous abscess cavity was  found.  Anaerobic and aerobic cultures were taken and sent to microbiology.  The wound was irrigated with normal saline.  Any necrotic tissue was  debrided.  Betadine-impregnated gauze was then placed into the wound.  The  wound was left open.  Dry sterile dressing was then applied.   All tape and needle counts were correct at the end of the procedure.  The  patient was awakened and transferred to the PACU in stable condition.   COMPLICATIONS:  None.   SPECIMENS:  None.   BLOOD LOSS:  Minimal.      MAJ/MEDQ  D:  08/21/2004  T:   08/21/2004  Job:  [id number]   cc:   Micah Flesher. Wray Kearns  Fax: [phone]

## 2010-09-18 NOTE — Op Note (Signed)
[facility]  Patient:    NOVELLE, RAWLINGS Visit Number: [id number] MRN: [id number]          Service Type: OBS Location: 4A A414 01 Attending Physician:  Rayna Sexton. Dictated by:   Benedetto Goad, M.D. Proc. Date: 07/31/01 Admit Date:  07/31/2001 Discharge Date: 07/31/2001                             Operative Report  PROCEDURE:  Nonstress test.  INDICATIONS FOR PROCEDURE:  Maternal history of diabetes, class D diabetes.  SUMMARY:  The patient is placed on the external fetal monitor. Fetal heart tones reveal fetal heart rate baseline at 150-160. There are accelerations noted of greater than 15 beats per minute x greater than 15 seconds duration. No fetal heart rate decelerations are noted. There was noted to be occasional uterine contractions traced on the ______.  IMPRESSION:  Reactive reassuring nonstress test, occasional uterine activity.  PLAN:  Continue two times weekly nonstress test. Dictated by:   Benedetto Goad, M.D. Attending Physician:  Rayna Sexton. DD:  08/11/01 TD:  08/11/01 Job: [id number]

## 2010-09-18 NOTE — Discharge Summary (Signed)
[facility]  Patient:    Lauren [name], Lauren [name] Visit Number: [id number] MRN: [id number]          Service Type: OBS Location: 4A [id number] Attending Physician:  Rayna Sexton. Dictated by:   Benedetto Goad, M.D. Admit Date:  07/24/2001 Disc. Date: 07/24/01   CC:         Mallory Shirk, M.D.   Discharge Summary  HOSPITAL COURSE:  The patient was admitted on July 20, 2001, by Dr. Mallory Shirk at 33+ weeks gestation for preterm labor.  The patient had been seen earlier that day at which time she had a reactive nonstress test interpreted by Dr. Glo Herring.  She was noted to be having irregular uterine contractions on the monitor.  The patient was discharged to home, but returned that p.m. with increasing frequency and intensity of uterine contractions as well as dampened panties and pants.  The patient was evaluated and noted to have no evidence of rupture of membranes.  She was at that point and time noted to be having fairly regular uterine contractions and was treated with IV magnesium sulfate tocolysis.  In addition, she received two doses of IM betamethasone 12 mg IM repeated in 24 hours.  The patient, thereafter, was continued for this hospitalization with the magnesium sulfate x24 hours at which time the tocolysis was determined to be successful and IV magnesium sulfate was discontinued.  The patient thereafter did have some difficulty in regulation of her blood sugars likely as a result of the betamethasone that had been administered to enhance fetal lung maturity requiring frequency adjustments in her blood sugar per Dr. Lesly Rubenstein management with his effort being to obtain a.m. dosages with 2/3 NPH, 1/3 regular, and then in the p.m., 50/50 N versus regular.  The patient, in addition, was continued on a regular sliding scale.  The patient, thereafter, did have some improvement in her blood sugars.  Extensive amount of time was spent with Dr.  Mallory Shirk in consultation with the patient and her mother.  He became aware of the fact that the patient is very noncompliant and not very interested in her diabetes management.  She was very unclear on her current dosages on insulin, at times giving varying reports.  In addition, Dr. Glo Herring requested on several occasions for the patient to bring her home glucose monitor in for calibration.  The patient failed to do this during the remainder of the hospitalization.  The patients mother was a little bit more informed regarding the patients disease and her current dosages.  The patient was seen in consultation by dietician in an effort to improve her blood sugars, but this likewise, played out to be one of the difficulties in that her dietary intake fluctuates significantly.  After adjusting the insulin dosages, at the time of discharge, her final dosages now continued to be 40 units of N in the a.m. and 20 units of regular, 12 units of regular at 1700 hours and at bedtime, the most recent change with the patient previously on 28 units of N and was changed to 20 units of N on the p.m. of July 23, 2001, resulting in a fasting blood sugar this a.m. of 279.  At the time of discharge, her q.h.s. N dose is now changed to 24 units.  LABORATORY DATA AND X-RAY FINDINGS:  Blood sugars as described previously. Hemoglobin 12.7, hematocrit 35.2, white blood count 9.2.  Hemoglobin A1C 9.8.  Transabdominal ultrasound had been performed during this hospitalization.  Report was not available on chart.  Verbal summary was that estimated fetal weight at this point and time is 6-1/2 pounds with a normal anatomic survey.  DISPOSITION:  Discharged to home by Dr. Benedetto Goad on July 24, 2001.  Less than 30 minutes involved in discharge.  The patients admission on July 20, 2001, with hospitalization care provided by Dr. Mallory Shirk on March 21, March 22 and March 23.  FOLLOWUP:  The patient will  follow up in three days time for regularly scheduled Monday and Thursday NSTs. Dictated by:   Benedetto Goad, M.D.  Attending Physician:  Rayna Sexton. DD:  07/24/01 TD:  07/25/01 Job: [id number]

## 2010-09-18 NOTE — H&P (Signed)
NAME:  Lauren [name], BIERNAT NO.:  [id number]   MEDICAL RECORD NO.:  [id number]          PATIENT TYPE:  EMS   LOCATION:  ED                            FACILITY:  [facility]   PHYSICIAN:  Karlyn Agee, M.D. DATE OF BIRTH:  01/01/1981   DATE OF ADMISSION:  04/19/2005  DATE OF DISCHARGE:  LH                                HISTORY & PHYSICAL   PRIMARY CARE PHYSICIAN:  Tammi Sou, M.D.   CHIEF COMPLAINT:  Vomiting and diarrhea since yesterday.   HISTORY OF PRESENT ILLNESS:  This is a 30 year old Caucasian lady with a  history of diabetes type 1, maintained on Lantus and sliding scale NovoLog,  who has been in fair health until yesterday, when she started having  diarrhea, proceeded to intractable vomiting.  She has not really treated  herself with anything.  She has continued to take her insulin, but  eventually came to the emergency room tonight where she was found to be  markedly acidotic.  The patient is vomiting so much and asking to go to the  bathroom, that she is unable to give a full history.  A lot of this is taken  from her boyfriend.  The vomitus at the moment is yellow with coffee-ground  elements.  Her diarrhea is watery and yellow.  She is having generalized  abdominal pain.  She has been having no fevers, no chest pains.  She has  been having no syncopal episodes but she has been feeling dizzy.  Nobody  else in the family is having vomiting and diarrhea.   PAST MEDICAL HISTORY:  1.  Diabetes type 1.  2.  Anxiety and depression.   MEDICATIONS:  1.  Lantus 40 units each morning.  2.  NovoLog by sliding scale, typically 2 units with meals.  3.  Valium 10 mg daily for anxiety and tachycardia.   ALLERGIES:  WELLBUTRIN CAUSES HIVES.   SOCIAL HISTORY:  Smokes one-half pack per day for the past 7-8 years.  She  does not drink alcohol.  She is unmarried.  Both she and her boyfriend live  with her mother.  She has a 22-year-old daughter.   FAMILY HISTORY:   Significant for diabetes, hypertension and heart disease.   REVIEW OF SYSTEMS:  Other than noted above, a 10-point review of systems  yielded nothing further.   PHYSICAL EXAMINATION:  GENERAL:  An ill looking, nauseated and distressed,  young Caucasian lady sitting up in the stretcher.  VITAL SIGNS:  Temperature is not currently recorded.  Her pulse is 144.  Respirations are 22.  Her blood pressure is 123/81.  Her pain level is 9/10.  She is saturated 100%.  HEENT:  Her pupils are round, equal.  Mucous membranes show she is very dry.  There is no oral candida and no cervical lymphadenopathy.  No jugular venous  distention.  CHEST:  Clear to auscultation bilaterally.  CARDIOVASCULAR SYSTEM:  She is tachycardic without murmur.  ABDOMEN:  Obese and diffusely tender.  There is no rebound.  EXTREMITIES:  Without edema.  She has 3+ pulses bilaterally.  NEUROLOGICAL:  Her cranial nerves II-X are grossly intact.  There are no  focal neurological deficits.   LABORATORY DATA:  Her lab show a white count of 11.7, hemoglobin 15.8, MCV  88, RDW 12.6, platelets 342,000.  She has absolute granulocyte count at 7.5.  Eosinophils are normal.  Blood acetone is moderate.  Liver function tests  are normal.  Lipase is 24.  Her sodium is 131, potassium 4.5, chloride 102,  CO2 10, glucose 489, BUN 13, creatinine 1.4, calcium 8.8.  Urinalysis is  cloudy with a pH of 5, 500 glucose, over 80 ketones, negative for nitrites  or leukocyte esterase, a trace of protein.  White cells are 3-6.  Bacteria  are few.  She has trichomonas in her urine.  Urine pregnancy test is  negative.   ASSESSMENT:  1.  Diabetic ketoacidosis.  Will get ABG and start a DKA protocol as per      orders.  She will probably become hypokalemic.  We will supplement her      insulin.  2.  Gastroenteritis.  We will keep her n.p.o. and the DKA regimen which will      probably treat that.  We will give her a Zofran for nausea.  3.  Renal  insufficiency.  DKA protocol will take care of that.  4.  We will also do a septic workup as a precipitating cause for her DKA.   ADDENDUM:  This lady's ABG shows a pH of 7.1, with 97% saturation, a pCO2 of  10.4 and a pO2 of 128.  She will need ICU care.   Total time in admission and dedicated management of this patient is 1 hour,  15 minutes.      Karlyn Agee, M.D.  Electronically Signed     LC/MEDQ  D:  04/19/2005  T:  04/19/2005  Job:  [id number]

## 2010-09-18 NOTE — H&P (Signed)
NAME:  Lauren [name], Lauren [name]             ACCOUNT NO.:  [id number]   MEDICAL RECORD NO.:  [id number]          PATIENT TYPE:  AMB   LOCATION:  DAY                           FACILITY:  [facility]   PHYSICIAN:  Jamesetta So, M.D.  DATE OF BIRTH:  1980-10-24   DATE OF ADMISSION:  DATE OF DISCHARGE:  LH                                HISTORY & PHYSICAL   CHIEF COMPLAINT:  Abscess, right thigh.   HISTORY OF PRESENT ILLNESS:  The patient is a 30 year old white female  status post incision and drainage of bilateral anterior thigh abscesses on  August 10, 2004. The left thigh has healed. The right thigh has recently  worsened.   PAST MEDICAL HISTORY:  1.  Insulin-dependent diabetes mellitus.  2.  Oxycodone.  3.  Unknown antibiotic.   PAST SURGICAL HISTORY:  Unremarkable.   CURRENT MEDICATIONS:  1.  Lantus 40 units subcu q.a.m.  2.  Oxycodone as needed.   ALLERGIES:  WELLBUTRIN.   REVIEW OF SYSTEMS:  The patient smokes a half a pack of cigarettes a day.  She denies any alcohol use.   PHYSICAL EXAMINATION:  GENERAL:  The patient is a well-developed, well-  nourished, white female in no acute distress. She is afebrile and vital  signs are stable.  LUNGS:  Clear to auscultation with equal breath sounds bilaterally.  HEART:  Reveals a regular rate and rhythm without S3, S4, or murmurs.  EXTREMITIES:  Right thigh reveals an indurated, erythematous area around a  previous surgical incision site. No drainage from the wound is noted.   IMPRESSION:  Worsening right thigh abscess.   PLAN:  The patient is scheduled for incision and drainage of the right thigh  abscess on August 21, 2004. The risks and benefits of the procedure were  fully explained to the patient who gave informed consent.      MAJ/MEDQ  D:  08/20/2004  T:  08/20/2004  Job:  [id number]   cc:   [facility]  Fax: [place] Wray Kearns  Fax: [phone]

## 2010-09-18 NOTE — Discharge Summary (Signed)
[facility]  Patient:    GRISELL, HARP Visit Number: [id number] MRN: [id number]          Service Type: OBS Location: 4A A414 01 Attending Physician:  Rayna Sexton. Dictated by:   Benedetto Goad, M.D. Admit Date:  07/31/2001 Discharge Date: 07/31/2001                             Discharge Summary  NOTE:  This is a transfer summary OB observation note.  The patient is transported to [facility] for continued management.  BRIEF HISTORY:  The patient is a 30 year old gravida 1, para 0, [redacted] weeks gestation, a subclass D diabetic being diabetic since age 79.  The patient presents to [facility] complaining of several uterine contractions this p.m.  She denies any leakage of fluid or any vaginal bleeding.  The patients prenatal course has been complicated by difficulty with controlling her diabetes.  The patient has previously been under the care of a endocrinologist and has previously had an insulin pump, however, she discontinued use of the insulin pump during the pregnancy and never kept any appointments with her endocrinologist thus management of her diabetes was through my office only.  The patients most recent dosages are q.a.m. 20 units R 40 units of N, p.m. 12 units R and q.h.s. 24 units N.  The patient also uses a sliding scale of R to adjusted dosages as needed.  During the patients prenatal course she has had serial ultrasounds which have most recently documented findings consistent with fetal macrosomia as well as polyhydramnios.  Her AFP triple screen was normal.  The patient has had twice weekly non-stress tests since [redacted] weeks gestation.  Most recently she had one done today which was reactive with no fetal heart rate decelerations noted.  PHYSICAL EXAMINATION:  GENERAL:  No acute distress.  VITAL SIGNS:  Blood pressure 143/90, temperature 97.4, pulse of 80, respiratory rate is 22.  HEENT:  Negative.  No  adenopathy.  NECK:  Supple.  Thyroid is not palpable.  ABDOMEN:  Soft and nontender.  No surgical scars are identified.  She is 38 cm fundal height and vertex presentation by leopolds maneuvers.  PELVIC:  REveals the cervix to be very far posterior, 60% effaced, but with a vertex at 0 station.  External fetal monitor reveals only rare uterine contractions.   The fetal heart tones by external fetal monitor revealed decreased long term variabilities.  She does have periodic changes in the fetal heart rate of 10 beats per minute x10 seconds but no fetal heart rate accelerations are noted.  After the several contractions that she does have she appears to have some subtle decelerations noted.  LABORATORY DATA:  Blood sugar obtained at home is 168.  Urine is negative for ketones.  There is 1000 mg/dl of glucose present in the urine.  ASSESSMENT:  A 35 week intrauterine pregnancy, Class D diabetic, now with non-reassuring fetal heart rate with decreased long term variability, no true accelerations noted and what appears to possibly be late decelerations associated with uterine activity.  Thus, at this point in time, due to the patients multiple high risk factors she will be referred to [facility] for continued management. Dictated by:   Benedetto Goad, M.D. Attending Physician:  Rayna Sexton. DD:  08/09/01 TD:  08/10/01 Job: [id number]

## 2010-09-18 NOTE — H&P (Signed)
NAME:  Lauren [name], BESCH NO.:  [id number]   MEDICAL RECORD NO.:  [id number]          PATIENT TYPE:  INP   LOCATION:  A201                          FACILITY:  [facility]   PHYSICIAN:  Debbe Odea, M.D.     DATE OF BIRTH:  1980-09-26   DATE OF ADMISSION:  08/05/2004  DATE OF DISCHARGE:  LH                                HISTORY & PHYSICAL   HISTORY OF PRESENT ILLNESS:  This is a 30 year old white female with a  history of insulin-dependent diabetes mellitus who is being admitted from  Dr. Idelle Leech office today.  When seen in Dr. Idelle Leech office, the patient  was orthostatic.  She complains that she has been having nausea and vomiting  which began on Sunday and continued through Monday, and then on Monday she  started having diarrhea anywhere from three to six bowel movements per day,  including loose stools at night.  She has been having diarrhea persistently  since Monday without any signs of improvement.  She states that she has had  loose bowel movements without any blood or mucus in her stool.  She does not  complain of any fevers.  Currently, she is not nauseated or vomiting.  Her  last episode of vomiting was on Monday which was two days ago.  She does  complain of abdominal pain which she states is like cramping in her lower  abdomen and it is not relieved with bowel movements.  She has been eating  for the past couple of days and has been taking her insulin;  however, when  in Dr. Idelle Leech office today, she was orthostatic.  She states that she  does get dizzy when she stands up.  She has been checking her blood sugars  as well which she says have been ranging in the 200s.   Dr. Anitra Lauth did some blood work in his office.  Of note, white count was  12,000.  All other blood work was more or less normal.  She also had an UA  and an urine culture done which has grown E. coli.  She does not complain of  any dysuria or frequency of micturition or noticing any blood in  her stools.   The patient has been treated with Augmentin for a folliculitis that she had  in the perineal area.  Augmentin was started last week over nine days ago.  She states that her last dose of Augmentin was on Sunday.   REVIEW OF SYSTEMS:  Negative for fever and chills.  It is negative for any  headache, chest pain, and dysuria.   PAST MEDICAL HISTORY:  1.  She is a type 1 diabetic.  2.  History of depression.   PAST SURGICAL HISTORY:  None.   ALLERGIES:  WELLBUTRIN, which she states gives her hives.   SOCIAL HISTORY:  She smokes 1/2 pack per day for about seven years.  She  does not drink alcohol.  She is not married.  She has one daughter who is 96  years old and healthy.   MEDICATIONS:  1.  Lantus 40 units q.a.m.  2.  Humalog 5 units before each meal.   PHYSICAL EXAMINATION:  VITAL SIGNS:  She is afebrile.  Pulse is 90, blood  pressure is 98/60, respiratory rate is 16.  Orthostatic blood pressure is  pending.  HEENT:  Normocephalic, atraumatic.  Pupils equal, round, reactive to light  and accommodation.  Extraocular movements were intact.  Conjunctivae are  pink.  Oral mucosa is moist.  NECK:  Supple, no lymphadenopathy.  HEART:  Regular rate and rhythm.  LUNGS:  Clear.  ABDOMEN:  Soft.  It is tender diffusely, but mostly in the lower quadrants.  Bowel sounds are positive.  There is no organomegaly.  EXTREMITIES:  No cyanosis, clubbing, or edema.  Pedal pulses are positive  bilaterally.   LABORATORY DATA:  Blood work is pending.   ASSESSMENT AND PLAN:  This is a 30 year old type 1 diabetic who has had an  onset of gastroenteritis since this weekend and has become progressively  dehydrated resulting in orthostatic hypotension for which she is being  admitted.  Intravenous fluids have been started, blood work is pending.  The  patient will be continued on her Lantus.  Since she is having no nausea or  vomiting, she will be receiving a regular diabetic diet.   Stools will be  checked for Clostridium difficile since she was on antibiotics recently for  folliculitis.  Once her bowel movements are under control and she is no  longer dehydrated, she will be discharged.   She has a history of an urinary tract infection.  A repeat urinalysis will  be checked.  If it is positive, she will be started on antibiotics.      SR/MEDQ  D:  08/05/2004  T:  08/05/2004  Job:  [id number]

## 2010-09-18 NOTE — Op Note (Signed)
NAMEDIANDRA, HEDEEN NO.:  [id number]   MEDICAL RECORD NO.:  [id number]          PATIENT TYPE:  EMS   LOCATION:  ED                            FACILITY:  [facility]   PHYSICIAN:  Jonnie Kind, M.D. DATE OF BIRTH:  Jun 12, 1980   DATE OF PROCEDURE:  09/15/2005  DATE OF DISCHARGE:  09/15/2005                                 OPERATIVE REPORT   INDICATIONS:  Incomplete abortion.   HISTORY OF PRESENT ILLNESS:  This 30 year old female seen in the emergency  room for evaluation of bleeding.  She has known pregnancy, heavy vaginal  bleeding beginning 3 hours prior to arrival with moderate cramping is noted.  Bleeding is accompanied by passage of clots.  She has laboratory evaluation  showed hemoglobin 14, hematocrit 42, with quantitative HCG of 6000  declining.  Blood tests A positive.   DETAILS OF PROCEDURE:  The patient was seen in the examination room,  identified to have heavy bleeding and the cervical os was noted to be  partially opened. Clots were removed from the cervical os.  The cervix is  already dilated sufficiently.  Dilation was not necessary.  Cervix was  grasped held steady, visualized through the speculum, a ring forceps were  used to pass through the dilated cervical os extracting tissue visible  within the lower uterine segment.  The patient is awake and conscious not  requiring analgesics.  She is able to push and with this process and ring  forceps traction, we were able to gradually gently tease out the tissue from  the uterine cavity.  There is an intact collapsed gestational sac and  attached tissue.  Ring forceps probing of the uterine cavity was further  performed confirming a smaller uterine cavity and satisfactory uterine  evacuation.  Again blood type was Rh positive.  The patient was allowed to  go home after Pitocin 10 units to run x 1 liter with D/C one hour after  procedure.  Will follow up 1 week our office.      Jonnie Kind,  M.D.  Electronically Signed     JVF/MEDQ  D:  10/05/2005  T:  10/06/2005  Job:  [id number]

## 2010-09-18 NOTE — Discharge Summary (Signed)
NAMEKELLEN, WALKINSHAW             ACCOUNT NO.:  [id number]   MEDICAL RECORD NO.:  [id number]          PATIENT TYPE:  INP   LOCATION:  [id number]                          FACILITY:  [facility]   PHYSICIAN:  Tammi Sou, MD  DATE OF BIRTH:  [birth year]   DATE OF ADMISSION:  01/22/2004  DATE OF DISCHARGE:  09/21/2005LH                                 DISCHARGE SUMMARY   HISTORY AND PHYSICAL AND DISCHARGE SUMMARY COMBINATION:   CHIEF COMPLAINT:  Nausea and vomiting.   HISTORY OF PRESENT ILLNESS:  Earna is a 30 year old white female who has  type 1 diabetes and has had about a 24-hour history of nausea, vomiting, and  diarrhea.  Emesis has been non-bloody and non-bilious.  Stool has basically  been brownish water.  She has had no fever and only rare abdominal cramping.  Denies any sick contacts or abnormal food eaten recently.  Her glucose has  been fluctuating over the last 24 to 48 hours.  She went to [facility]  Emergency Department last night and got some labs and IV fluids and was  given Phenergan and Lomotil to go home with.  The diarrhea improved a  little, but the nausea and vomiting was the same, and she cannot even keep  down sips of clear fluids.  I recommended admission to the hospital or  monitoring and IV fluids for viral gastroenteritis.   PAST MEDICAL HISTORY:  Type 1 diabetes, history of marginal compliance and  control.   PAST SURGICAL HISTORY:  None.   FAMILY HISTORY:  Noncontributory.   SOCIAL HISTORY:  She is single and has one daughter.  She smokes one pack of  cigarettes per day, denies alcohol or drugs.   REVIEW OF SYSTEMS:  No cough, no sore throat, no URI symptoms, no rash, no  shortness of breath, no palpitations.   PHYSICAL EXAMINATION:  VITAL SIGNS:  Afebrile.  Vital signs normal in my  office.  GENERAL:  The patient is tired but not toxic in appearance.  HEENT:  Slightly tacky mucous membranes without erythema or lesion.  Her  nose is without congestion  or discharge.  Eyes are without icterus or  injection.  NECK:  Nodes are not enlarged.  CARDIAC:  Regular rhythm with tachycardia to 150, no murmur.  LUNGS:  Clear in all fields with breathing not labored.  ABDOMEN:  Soft with mild tenderness diffusely without guarding or rebound.  There is no hepatosplenomegaly or mass.  Bowel sounds are normoactive.  EXTREMITIES:  There is no edema, clubbing, or cyanosis.   LABORATORY AND X-RAY DATA:  Labs pending.   IMPRESSION:  1.  Gastroenteritis, likely viral.  2.  Diabetes, type 1, is complicating this.   PLAN:  1.  Recommended admission to the hospital for IV fluids and monitoring.  2.  Will obtain labs done at [facility] last night.   HOSPITAL COURSE:  I sent her to the hospital today with orders.  After  arriving on the floor, she decided that she could not be admitted because  she could not find child care for her daughter.  I warned her  of the risks  of declining admission to the hospital including possibility of diabetic  ketoacidosis, and she would still would not stay in the hospital.  She  signed AMA papers and left the hospital soon after arriving on the floor.  She will follow up in the clinic tomorrow per my request.     Phil   PHM/MEDQ  D:  03/16/2004  T:  03/16/2004  Job:  [id number]

## 2010-09-18 NOTE — Discharge Summary (Signed)
[facility]  Patient:    Lauren [name], Lauren [name] Visit Number: [id number] MRN: [id number]          Service Type: OBS Location: 4A A414 01 Attending Physician:  Rayna Sexton. Dictated by:   Benedetto Goad, M.D. Admit Date:  07/31/2001 Disc. Date: 07/25/01                             Discharge Summary  OBSERVATION NOTE  REASON FOR OBSERVATION:  An insulin-dependent diabetic who presents to [facility] complaining of uterine contractions. External fetal monitor revealed very mild irregular uterine contractions occurring q.2-6 minutes. Of concern with the initial fetal heart rate strip with a fetal heart rate baseline of 160-170 with decreased long-term variability. The patient was treated with a single injection of a subcu terbutaline which resulted in cessation of uterine activity.  PHYSICAL EXAMINATION:  PELVIC:  On digital examination cervix noted to be at most a 1 cm, vertex presentation, nonengaged and cervix about 3 cm long.  VITAL SIGNS:  Blood pressure 128/70, pulse rate 120, respiratory rate 20.  GENERAL:  She appears mildly distressed secondary to palpitations experienced after administration of the terbutaline.  FETAL MONITOR:  External fetal monitor and pelvic exam is as previously.  HOSPITAL COURSE:  The patient is administered 2 mg of IV morphine sulfate in an effort to counteract the tachycardic palpitations due to terbutaline. The patient did do well with the IV morphine sulfate with decreasing anxiety level as well as decreasing heart rate. The patient, however, was continued hospitalization over the night to be certain that the preterm contractions did not continue. The patient was, again, evaluated on July 25, 2001 at which time she was noted to have slept well during the evening with no significant complaints of uterine contractions. A physical examination reveals the patient now in no acute distress. The cervix  reveals the cervix to be unchanged. Thus, the patient is discharged to home on a separate date of service, July 25, 2001 less than 30 minutes duration.  HOSPITAL OBSERVATION SERVICES:  A [redacted] weeks gestation, preterm labor, insulin-dependent diabetes. Date July 24, 2001 [phone]), date of discharge July 25, 2001 [phone]). In addition, a nonstress test interpretation on July 24, 2001. Nonstress test is again repeated and again interpreted on July 25, 2001 with again findings of a reactive nonstress test. Dictated by:   Benedetto Goad, M.D. Attending Physician:  Rayna Sexton. DD:  07/30/01 TD:  07/30/01 Job: [id number]

## 2010-09-18 NOTE — Discharge Summary (Signed)
NAMECHRISTELLE, Lauren [name]             ACCOUNT NO.:  [id number]   MEDICAL RECORD NO.:  [id number]          PATIENT TYPE:  INP   LOCATION:  A201                          FACILITY:  [facility]   PHYSICIAN:  Debbe Odea, M.D.     DATE OF BIRTH:  1981/02/28   DATE OF ADMISSION:  08/05/2004  DATE OF DISCHARGE:  04/07/2006LH                                 DISCHARGE SUMMARY   DISCHARGE DIAGNOSES:  1.  Gastroenteritis.  2.  Orthostatic hypotension secondary to gastroenteritis.  3.  Proteus urinary tract infection.  4.  Insulin-dependent diabetes mellitus.   DISCHARGE MEDICATIONS:  Include:  1.  Bactrim DS for two more doses.  2.  Insulin as previously taking at home.   HOSPITAL COURSE:  This is a 30 year old white female who had nausea and  vomiting and diarrhea beginning this past weekend.  The patient was admitted  from Dr. Idelle Leech office for orthostatic hypotension.  Dr. Anitra Lauth had also  checked a UA and found that she had a proteus UTI.  Therefore, she was  admitted for IV fluids and also received p.o. antibiotics for a UTI.  Her  diarrhea improved and actually she was unable to give any stool samples for  any stool studies.  On the second day of admission the patient was still  orthostatic and feeling slightly dizzy on walking.  Therefore, she was  continued in the hospital with continued IV fluids.  On the third day of  admission the patient is no longer dizzy on walking.  Her blood pressure is  107/69 with a pulse of 83 on lying down.  On standing it is 108/83 with a  pulse of 94.  Therefore, she is no longer orthostatic.  She is going to be  discharged home on Bactrim.  She is going to receive two more tablets to  complete her three day course.   DIET:  Continue a diabetic diet.  Drink about eight glasses of water/day.   DISCHARGE MEDICATIONS:  1.  Continue to take insulin as previously ordered.  2.  Make sure to take two doses of antibiotics to fully complete the course.   LABORATORY WORK:  On August 06, 2004 her white count was 11.2, hemoglobin was  13.6, MCV 84.4, platelets 314.  Sodium was 138, potassium was 3.5, chloride  106, bicarbonate 26, glucose 207, BUN 10, creatinine 0.6, calcium 8.3.  TSH  was normal at 3.411.  UA on discharge is negative for nitrites and  leukocytes.      SR/MEDQ  D:  08/07/2004  T:  08/07/2004  Job:  [id number]   cc:   Tammi Sou, MD  [place]  [place] [address]  Fax: [phone]

## 2010-09-18 NOTE — H&P (Signed)
[facility]  Patient:    Lauren [name], Lauren [name] Visit Number: [id number] MRN: [id number]          Service Type: MED Location: 4A [id number] Attending Physician:  Jonnie Kind Dictated by:   Mallory Shirk, M.D. Admit Date:  07/20/2001   CC:         Benedetto Goad, M.D.   History and Physical  ADMITTING DIAGNOSES: 1. Pregnancy at [redacted] weeks gestation. 2. Type 1 diabetes mellitus, suboptimal control. 3. Preterm labor. 4. Impulsive personalty.  HISTORY OF PRESENT ILLNESS:  This 30 year old female, gravida 1, para 0, LMP August 2002, making menstrual EDC unknown, with ultrasound March 01, 2001, at [redacted] weeks gestation placing [facility] at Sep 05, 2001.  The patient is 33 weeks 3 days by that ultrasound.  She had a subsequent ultrasound at [redacted] weeks gestation at [facility] to check out fetal echocardiography and this was reported as grossly normal.  Ultrasound dates were not assigned by that ultrasound.  On June 01, 2001, ultrasound at "27 weeks" abdominal circumference was greater than head circumference, consistent with her suboptimal control.  Since that time the patient has progressed and is now 36-cm fundal height at 33+ weeks gestation.  Ultrasound was performed today and the written report is not yet available, but it reportedly showed a fetal weight of 6.5 pounds according to the patient.  She does not know if polyhydramnios was identified.  The patient is admitted for tocolysis, betamethasone therapy, readjustment of insulin management, and consideration of transfer to tertiary facility if delivery appears necessary.  PAST MEDICAL HISTORY:  Type 1 diabetes since age 71, treated with Humalog insulin pump since January 2002, until she became pregnancy at which time she stopped the pump and switched to insulin injections.  During the pregnancy she has been on NPH insulin taken in the mornings and afternoons with what she describes as a  sliding scale insulin based on her food intake.  The patient is very unwilling to explain details of her regular insulin protocol.  Her mother independently describes the patient as being very impulsive in her management of her insulin.  She does not like to force herself to a regular regimen. This is reflected in her elevated hemoglobin A1c which were 10.9 at March 02, 2001, and 9.3 on January 31.  The patient usually adjusts her insulin based on her food consumption.  She has not been on a bedtime dose of NPH. She describes her morning blood sugars as ranging up to 180.  She has not had any hypoglycemic episodes in quite some time, having had 2-3 episodes that she recalls in the duration of the whole pregnancy.  Blood sugar upon admission tonight is 238.  She has not taken her bedtime NPH insulin tonight even though she has eaten.  SURGICAL HISTORY:  None, negative.  MEDICATIONS: 1. Trazodone 50 mg p.o. q.h.s. 2. Zantac 150 mg p.o. b.i.d. for heartburn. 3. Prenatal vitamins one p.o. q.d. 4. Keflex 250 mg q.i.d. for a UTI recently started at our office on July 19, 2001, with the patient not getting prescription filled until this    afternoon.  SOCIAL HISTORY:  The patient has a steady supportive boyfriend.  She lives with her mother who still attempts to manage the patients insulin.  She has an 8th-grade education, dropped out of school and quickly completed her GED.  PHYSICAL EXAMINATION:  GENERAL:  Reveals a large-framed Caucasian female.  VITAL SIGNS:  Weight 189  at last OB visit, will check at this time.  HEENT:  Pupils equal, round, reactive.  CARDIOVASCULAR EXAM:  Deferred at this time.  ABDOMEN:  Fundal height 36 cm, estimated fetal weight by ultrasound reported at 6.5 pounds.  PELVIC:  Cervix 1 cm, 25%, -2 station, vertex ballottable in relatively dilated lower uterine segment.  Nitrazine was checked by sterile speculum exam and it is negative and secretions  are grossly normal.  EXTREMITIES:  Grossly normal.  IMPRESSION: 1. Intrauterine pregnancy at 33-1/[redacted] weeks gestation. 2. Large for gestational age infant. 3. Type 1 diabetes with suboptimal control. 4. Impulsive personalty, refusing to accept the need for improved control.  PLAN: 1. Magnesium sulfate tocolysis initiated. 2. Betamethasone therapy ordered. 3. Urinary tract infection treatment continued. 4. Steroid therapy initiated. 5. Counseling the patient, boyfriend and family regarding our goal of    increasing diabetic regulation. 6. Will delay ordering of ophthalmology consult until outpatient status    achieved. Dictated by:   Mallory Shirk, M.D. Attending Physician:  Jonnie Kind DD:  07/20/01 TD:  07/21/01 Job: [id number]

## 2011-01-27 LAB — CBC
HCT: 40.7
Hemoglobin: 14.4
MCV: 86.8
RBC: 4.69
WBC: 17.3 — ABNORMAL HIGH

## 2011-01-27 LAB — BASIC METABOLIC PANEL
Chloride: 103
GFR calc Af Amer: >60
Potassium: 4
Sodium: 137

## 2011-01-27 LAB — URINALYSIS, ROUTINE W REFLEX MICROSCOPIC
Glucose, UA: 500 — AB
Ketones, ur: NEGATIVE
pH: 6

## 2011-01-27 LAB — URINE CULTURE

## 2011-01-27 LAB — DIFFERENTIAL
Eosinophils Absolute: 0
Lymphs Abs: 1.5
Monocytes Absolute: 0.7
Monocytes Relative: 4
Neutrophils Relative %: 86 — ABNORMAL HIGH

## 2011-01-27 LAB — URINE MICROSCOPIC-ADD ON

## 2011-02-01 LAB — URINALYSIS, ROUTINE W REFLEX MICROSCOPIC
Nitrite: NEGATIVE
Specific Gravity, Urine: 1.025
Urobilinogen, UA: 1
pH: 6

## 2011-02-01 LAB — URINE CULTURE

## 2011-02-01 LAB — WET PREP, GENITAL
Trich, Wet Prep: NONE SEEN
Yeast Wet Prep HPF POC: NONE SEEN

## 2011-02-01 LAB — GC/CHLAMYDIA PROBE AMP, GENITAL: Chlamydia, DNA Probe: NEGATIVE

## 2011-02-01 LAB — URINE MICROSCOPIC-ADD ON

## 2011-02-02 LAB — CBC
Hemoglobin: 10.4 — ABNORMAL LOW
Hemoglobin: 9.8 — ABNORMAL LOW
MCHC: 33.6
MCHC: 34.6
Platelets: 318
Platelets: 342
RBC: 3.3 — ABNORMAL LOW
RBC: 3.42 — ABNORMAL LOW
RBC: 3.5 — ABNORMAL LOW
RDW: 14.1
WBC: 10
WBC: 11.2 — ABNORMAL HIGH

## 2011-02-02 LAB — URINALYSIS, ROUTINE W REFLEX MICROSCOPIC
Glucose, UA: NEGATIVE
Leukocytes, UA: NEGATIVE
Nitrite: NEGATIVE
Specific Gravity, Urine: 1.02
pH: 6

## 2011-02-02 LAB — GLUCOSE, CAPILLARY
Glucose-Capillary: 104 — ABNORMAL HIGH
Glucose-Capillary: 109 — ABNORMAL HIGH
Glucose-Capillary: 117 — ABNORMAL HIGH
Glucose-Capillary: 123 — ABNORMAL HIGH
Glucose-Capillary: 130 — ABNORMAL HIGH
Glucose-Capillary: 132 — ABNORMAL HIGH
Glucose-Capillary: 136 — ABNORMAL HIGH
Glucose-Capillary: 146 — ABNORMAL HIGH
Glucose-Capillary: 167 — ABNORMAL HIGH
Glucose-Capillary: 39 — CL
Glucose-Capillary: 66 — ABNORMAL LOW
Glucose-Capillary: 70
Glucose-Capillary: 70
Glucose-Capillary: 70
Glucose-Capillary: 85
Glucose-Capillary: 89
Glucose-Capillary: 98

## 2011-02-02 LAB — COMPREHENSIVE METABOLIC PANEL
ALT: 13
ALT: 13
ALT: 14
ALT: 17
AST: 14
Albumin: 1.5 — ABNORMAL LOW
Albumin: 1.6 — ABNORMAL LOW
Alkaline Phosphatase: 152 — ABNORMAL HIGH
Alkaline Phosphatase: 171 — ABNORMAL HIGH
CO2: 23
CO2: 25
CO2: 26
Calcium: 7.8 — ABNORMAL LOW
Calcium: 8 — ABNORMAL LOW
Chloride: 104
Chloride: 106
Creatinine, Ser: 0.66
GFR calc Af Amer: >60
GFR calc Af Amer: >60
GFR calc non Af Amer: >60
GFR calc non Af Amer: >60
Glucose, Bld: 102 — ABNORMAL HIGH
Glucose, Bld: 174 — ABNORMAL HIGH
Glucose, Bld: 73
Potassium: 4.1
Potassium: 4.1
Sodium: 135
Sodium: 135
Sodium: 136
Sodium: 137
Total Protein: 4.7 — ABNORMAL LOW
Total Protein: 4.9 — ABNORMAL LOW
Total Protein: 5.1 — ABNORMAL LOW

## 2011-02-02 LAB — URINE MICROSCOPIC-ADD ON

## 2011-02-02 LAB — URIC ACID: Uric Acid, Serum: 5.3

## 2011-02-02 LAB — PROTEIN, URINE, 24 HOUR: Urine Total Volume-UPROT: 2300

## 2011-02-02 LAB — CREATININE CLEARANCE, URINE, 24 HOUR
Creatinine, Urine: 58.7
Creatinine: 0.6

## 2011-02-05 LAB — URINALYSIS, DIPSTICK ONLY
Glucose, UA: NEGATIVE mg/dL
Leukocytes, UA: NEGATIVE
Protein, ur: >300 mg/dL — AB
Specific Gravity, Urine: >1.03 — ABNORMAL HIGH (ref 1.005–1.030)
Urobilinogen, UA: 0.2 mg/dL (ref 0.0–1.0)

## 2011-02-05 LAB — GLUCOSE, CAPILLARY
Glucose-Capillary: 100 mg/dL — ABNORMAL HIGH (ref 70–99)
Glucose-Capillary: 103 mg/dL — ABNORMAL HIGH (ref 70–99)
Glucose-Capillary: 111 mg/dL — ABNORMAL HIGH (ref 70–99)
Glucose-Capillary: 122 mg/dL — ABNORMAL HIGH (ref 70–99)
Glucose-Capillary: 123 mg/dL — ABNORMAL HIGH (ref 70–99)
Glucose-Capillary: 136 mg/dL — ABNORMAL HIGH (ref 70–99)
Glucose-Capillary: 175 mg/dL — ABNORMAL HIGH (ref 70–99)
Glucose-Capillary: 188 mg/dL — ABNORMAL HIGH (ref 70–99)
Glucose-Capillary: 205 mg/dL — ABNORMAL HIGH (ref 70–99)
Glucose-Capillary: 224 mg/dL — ABNORMAL HIGH (ref 70–99)
Glucose-Capillary: 32 mg/dL — CL (ref 70–99)
Glucose-Capillary: 74 mg/dL (ref 70–99)
Glucose-Capillary: 84 mg/dL (ref 70–99)
Glucose-Capillary: 93 mg/dL (ref 70–99)
Glucose-Capillary: 97 mg/dL (ref 70–99)

## 2011-02-05 LAB — COMPREHENSIVE METABOLIC PANEL
ALT: 15 U/L (ref 0–35)
AST: 19 U/L (ref 0–37)
Albumin: 1.7 g/dL — ABNORMAL LOW (ref 3.5–5.2)
CO2: 23 mEq/L (ref 19–32)
Chloride: 107 mEq/L (ref 96–112)
Creatinine, Ser: 0.71 mg/dL (ref 0.4–1.2)
GFR calc Af Amer: >60 mL/min (ref >60)
GFR calc non Af Amer: >60 mL/min (ref >60)
Sodium: 136 mEq/L (ref 135–145)
Total Bilirubin: 0.4 mg/dL (ref 0.3–1.2)

## 2011-02-05 LAB — CBC
HCT: 35.6 % — ABNORMAL LOW (ref 36.0–46.0)
Hemoglobin: 12.2 g/dL (ref 12.0–15.0)
MCHC: 34.3 g/dL (ref 30.0–36.0)
MCV: 87.8 fL (ref 78.0–100.0)
Platelets: 216 10*3/uL (ref 150–400)
RBC: 3.8 MIL/uL — ABNORMAL LOW (ref 3.87–5.11)
RBC: 4.06 MIL/uL (ref 3.87–5.11)
WBC: 13.7 10*3/uL — ABNORMAL HIGH (ref 4.0–10.5)
WBC: 9.7 10*3/uL (ref 4.0–10.5)

## 2014-06-23 ENCOUNTER — Emergency Department (HOSPITAL_COMMUNITY): Payer: Medicaid Other

## 2014-06-23 ENCOUNTER — Encounter (HOSPITAL_COMMUNITY): Payer: Self-pay | Admitting: Emergency Medicine

## 2014-06-23 ENCOUNTER — Emergency Department (HOSPITAL_COMMUNITY)
Admission: EM | Admit: 2014-06-23 | Discharge: 2014-06-23 | Disposition: A | Discharge status: Home / Self Care | Payer: Medicaid Other | Attending: Emergency Medicine | Admitting: Emergency Medicine

## 2014-06-23 DIAGNOSIS — R110 Nausea: Secondary | ICD-10-CM | POA: Insufficient documentation

## 2014-06-23 DIAGNOSIS — R000 Tachycardia, unspecified: Secondary | ICD-10-CM | POA: Insufficient documentation

## 2014-06-23 DIAGNOSIS — M549 Dorsalgia, unspecified: Secondary | ICD-10-CM | POA: Insufficient documentation

## 2014-06-23 DIAGNOSIS — Z87448 Personal history of other diseases of urinary system: Secondary | ICD-10-CM | POA: Insufficient documentation

## 2014-06-23 DIAGNOSIS — Z3202 Encounter for pregnancy test, result negative: Secondary | ICD-10-CM | POA: Insufficient documentation

## 2014-06-23 DIAGNOSIS — R197 Diarrhea, unspecified: Secondary | ICD-10-CM | POA: Insufficient documentation

## 2014-06-23 DIAGNOSIS — R1032 Left lower quadrant pain: Secondary | ICD-10-CM | POA: Insufficient documentation

## 2014-06-23 DIAGNOSIS — E119 Type 2 diabetes mellitus without complications: Secondary | ICD-10-CM | POA: Insufficient documentation

## 2014-06-23 HISTORY — DX: Type 2 diabetes mellitus without complications: E11.9

## 2014-06-23 HISTORY — DX: Disorder of kidney and ureter, unspecified: N28.9

## 2014-06-23 LAB — CBC WITH DIFFERENTIAL/PLATELET
Basophils Absolute: 0 10*3/uL (ref 0.0–0.1)
Basophils Relative: 0 % (ref 0–1)
Eosinophils Absolute: 0.2 10*3/uL (ref 0.0–0.7)
Eosinophils Relative: 2 % (ref 0–5)
HCT: 40.8 % (ref 36.0–46.0)
Hemoglobin: 13.5 g/dL (ref 12.0–15.0)
Lymphocytes Relative: 6 % — ABNORMAL LOW (ref 12–46)
Lymphs Abs: 0.7 10*3/uL (ref 0.7–4.0)
MCH: 29 pg (ref 26.0–34.0)
MCHC: 33.1 g/dL (ref 30.0–36.0)
MCV: 87.7 fL (ref 78.0–100.0)
Monocytes Absolute: 0.3 10*3/uL (ref 0.1–1.0)
Monocytes Relative: 3 % (ref 3–12)
Neutro Abs: 10.5 10*3/uL — ABNORMAL HIGH (ref 1.7–7.7)
Neutrophils Relative %: 89 % — ABNORMAL HIGH (ref 43–77)
Platelets: 284 10*3/uL (ref 150–400)
RBC: 4.65 MIL/uL (ref 3.87–5.11)
RDW: 12.7 % (ref 11.5–15.5)
WBC: 11.8 10*3/uL — ABNORMAL HIGH (ref 4.0–10.5)

## 2014-06-23 LAB — POC URINE PREG, ED: Preg Test, Ur: NEGATIVE

## 2014-06-23 LAB — URINALYSIS, ROUTINE W REFLEX MICROSCOPIC
Bilirubin Urine: NEGATIVE
Glucose, UA: 100 mg/dL — AB
Hgb urine dipstick: NEGATIVE
Ketones, ur: NEGATIVE mg/dL
Leukocytes, UA: NEGATIVE
Nitrite: NEGATIVE
Protein, ur: 100 mg/dL — AB
Specific Gravity, Urine: 1.025 (ref 1.005–1.030)
Urobilinogen, UA: 0.2 mg/dL (ref 0.0–1.0)
pH: 7.5 (ref 5.0–8.0)

## 2014-06-23 LAB — URINE MICROSCOPIC-ADD ON

## 2014-06-23 LAB — BASIC METABOLIC PANEL
Anion gap: 4 — ABNORMAL LOW (ref 5–15)
BUN: 23 mg/dL (ref 6–23)
CO2: 19 mmol/L (ref 19–32)
Calcium: 8 mg/dL — ABNORMAL LOW (ref 8.4–10.5)
Chloride: 111 mmol/L (ref 96–112)
Creatinine, Ser: 1.02 mg/dL (ref 0.50–1.10)
GFR calc Af Amer: 84 mL/min — ABNORMAL LOW (ref >90)
GFR calc non Af Amer: 72 mL/min — ABNORMAL LOW (ref >90)
Glucose, Bld: 216 mg/dL — ABNORMAL HIGH (ref 70–99)
Potassium: 4.5 mmol/L (ref 3.5–5.1)
Sodium: 134 mmol/L — ABNORMAL LOW (ref 135–145)

## 2014-06-23 IMAGING — CT CT ABD-PELV W/ CM
2 of 4 series · 16 of 46 positions shown, 18 images · IV contrast (Omnipaque 300)
Comparison: Renal ultrasound dated [DATE]. Abdomen and pelvis
CT dated [DATE].

CLINICAL DATA: Left lower quadrant abdominal pain since 8 a.m.
today.

EXAM:
CT ABDOMEN AND PELVIS WITH CONTRAST
TECHNIQUE: Multidetector CT imaging of the abdomen and pelvis was performed
using the standard protocol following bolus administration of
intravenous contrast.
CONTRAST:  50mL OMNIPAQUE IOHEXOL 300 MG/ML SOLN, 100mL OMNIPAQUE
IOHEXOL 300 MG/ML SOLN

[Series 2: abd_pel_with 5.0 b40f · axial · 0.84mm/px · z∈[-500,-30]mm · 13 of 104 slices shown, 15 images]
[im 5/104  soft-tissue]
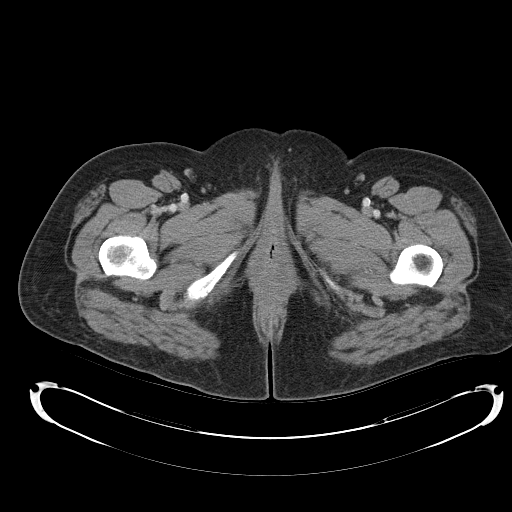
[im 5/104  bone]
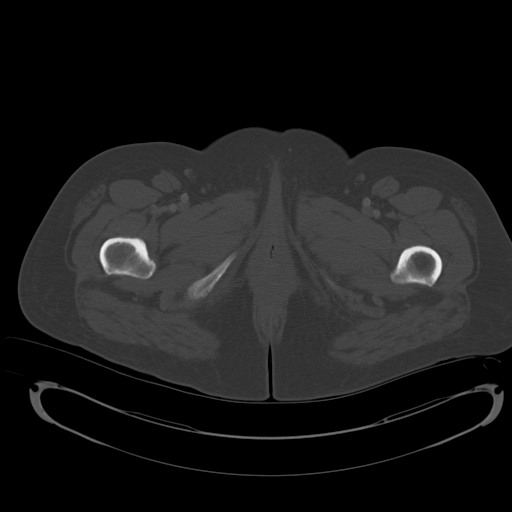
[im 13/104  soft-tissue]
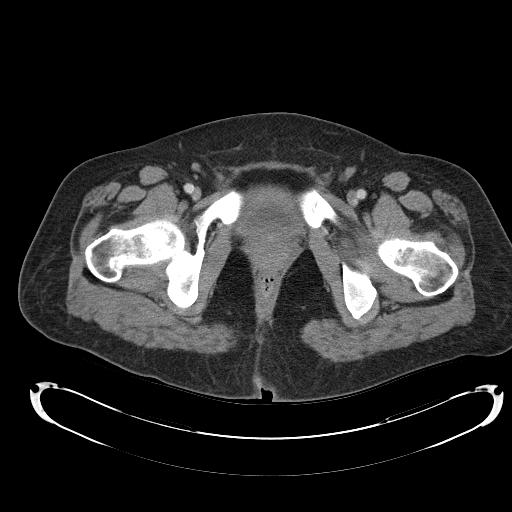
[im 21/104  soft-tissue]
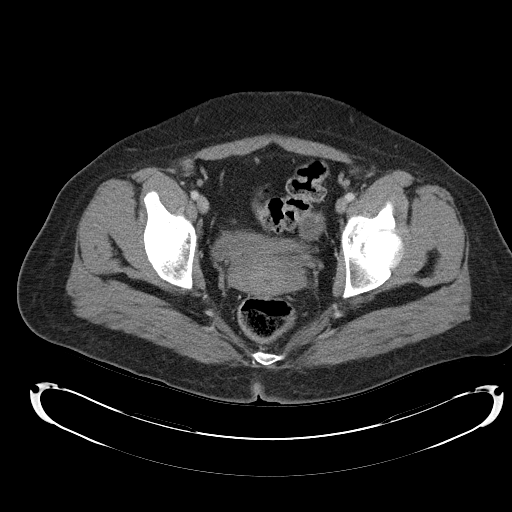
[im 29/104  soft-tissue]
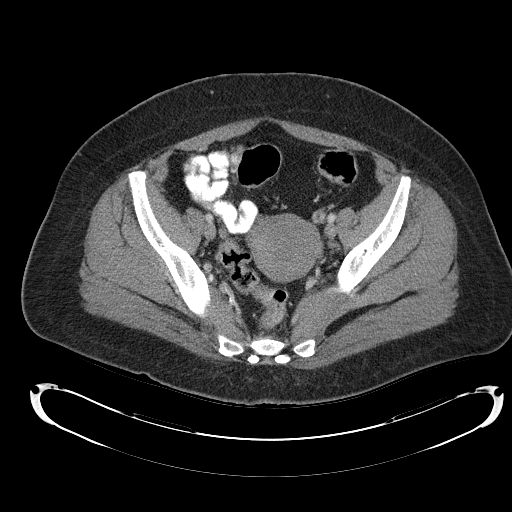
[im 38/104  soft-tissue]
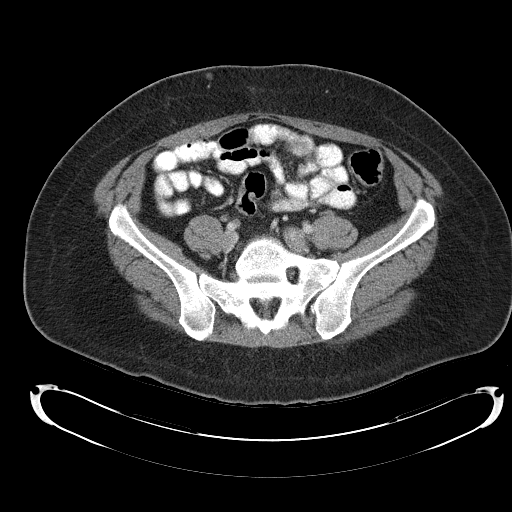
[im 46/104  soft-tissue]
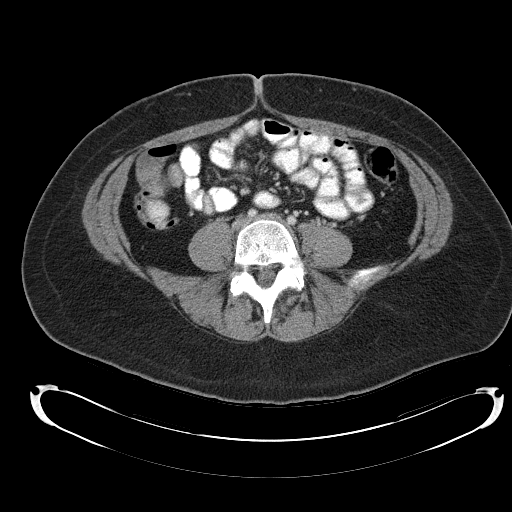
[im 54/104  soft-tissue]
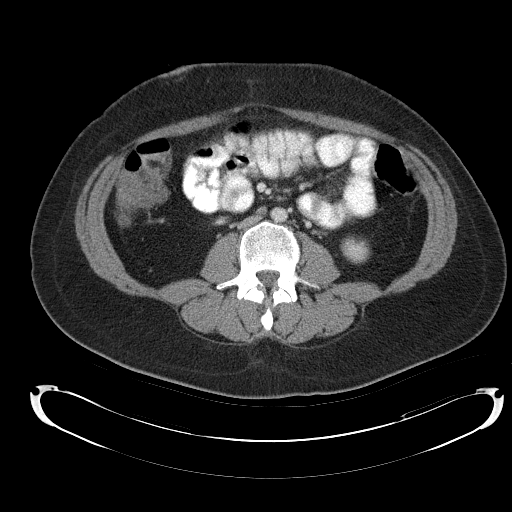
[im 58/104  soft-tissue]
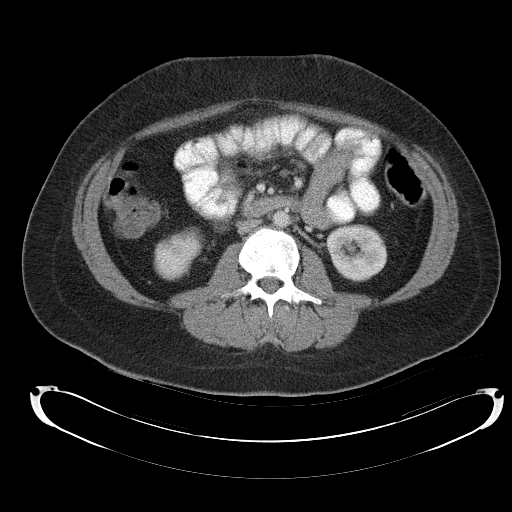
[im 66/104  soft-tissue]
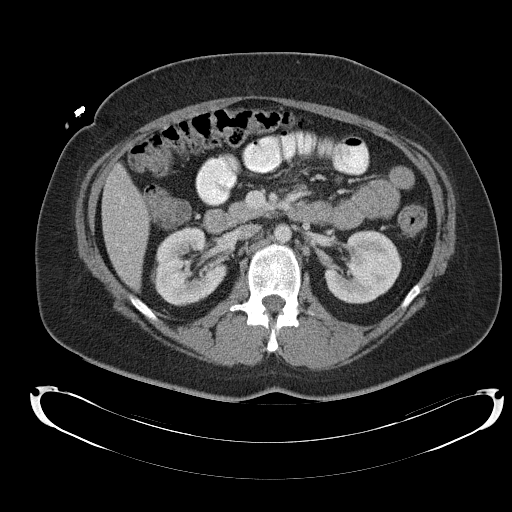
[im 66/104  bone]
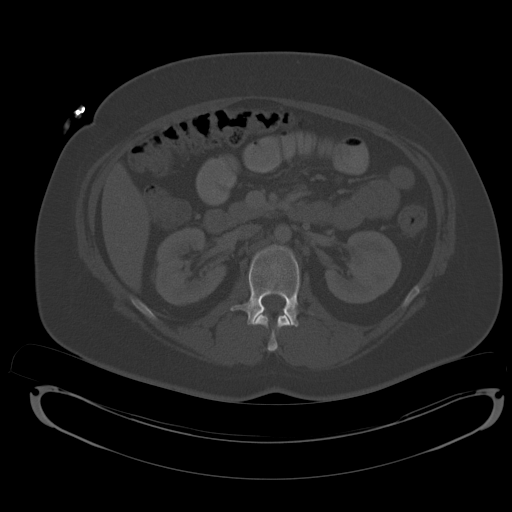
[im 75/104  soft-tissue]
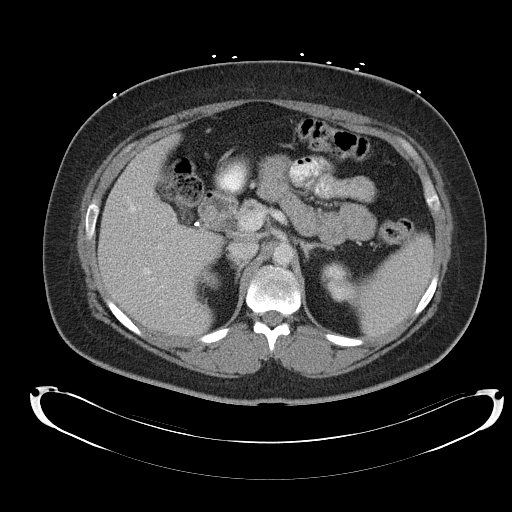
[im 83/104  soft-tissue]
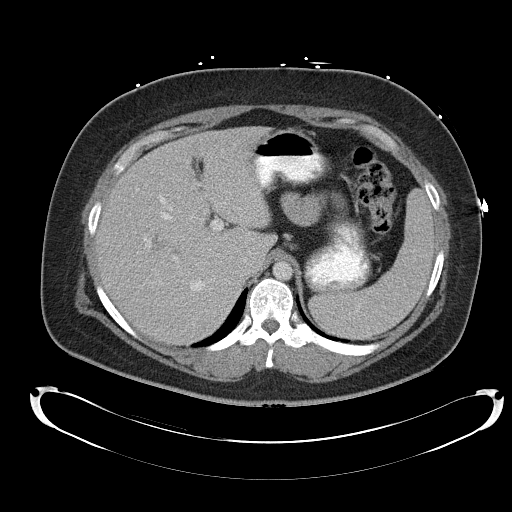
[im 91/104  soft-tissue]
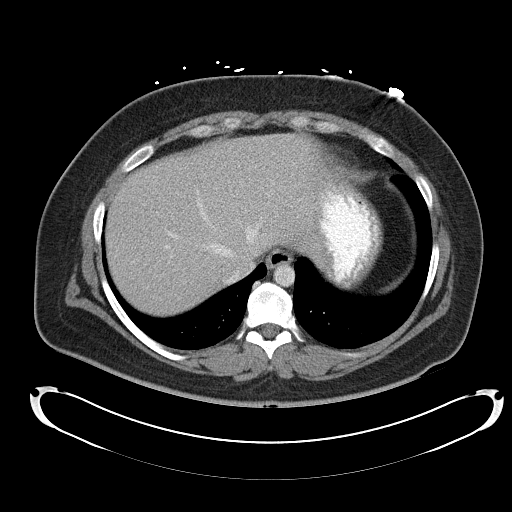
[im 99/104  soft-tissue]
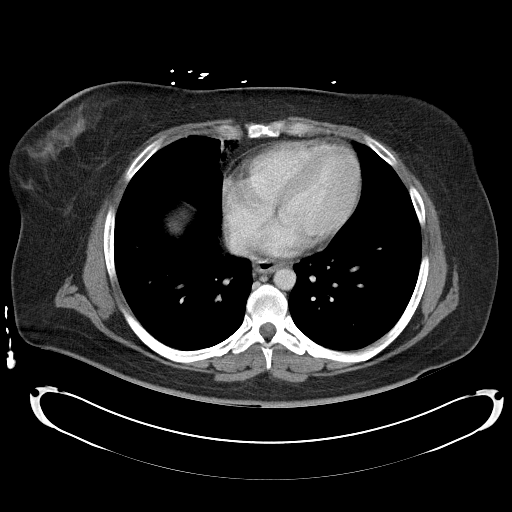

[Series 3: abd_pel_with 3.0 spo cor · coronal · 0.74mm/px · 3 of 92 slices shown]
[im 31/92  soft-tissue]
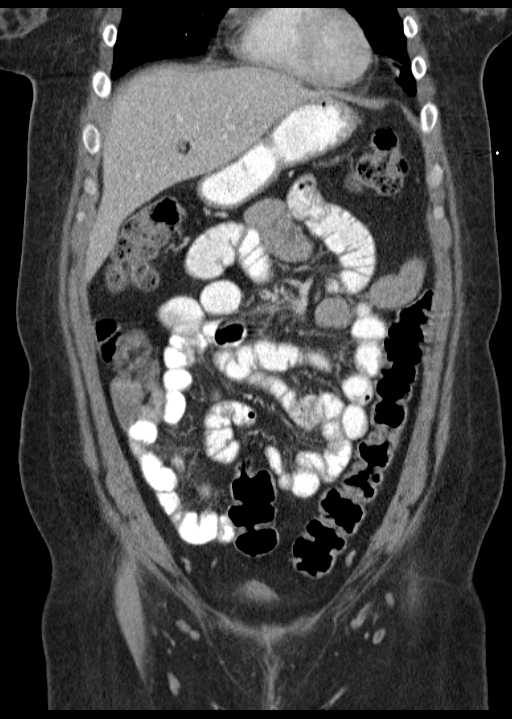
[im 41/92  soft-tissue]
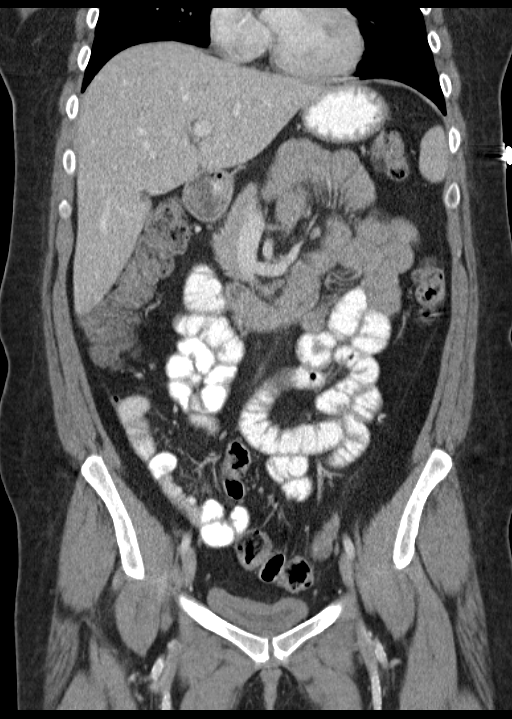
[im 51/92  soft-tissue]
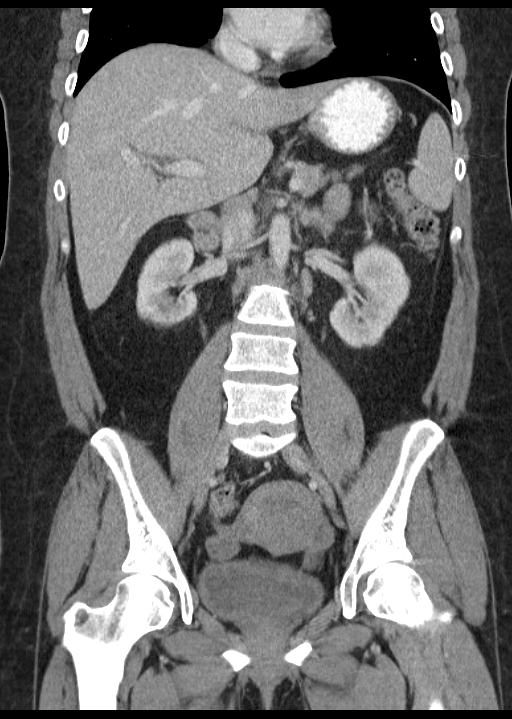

[16 of 46 positions shown; findings below may reference images not displayed]

FINDINGS: Cholecystectomy clips. Normal appearing liver, spleen, pancreas,
adrenal glands, kidneys, ureters, urinary bladder, uterus and
ovaries with a 2.7 cm simple appearing left ovarian follicle noted.
No gastrointestinal abnormalities or enlarged lymph nodes. Normal
appearing appendix.

8 mm triangular-shaped semi-solid nodule on the posterior right
lower lobe on image 3. There is a 5 mm similar-appearing nodule more
anteriorly and laterally in the right lower lobe on that image. 4 mm
triangular-shaped subpleural nodule in the periphery of the right
lung at the major fissure on image number 4. 5 mm rim subdural
nodule in the right lower lobe at the posterior costophrenic angle
on image number 21.

5 mm subpleural nodule in the periphery of the left lower lobe on
image number 10 2 3 mm subpleural nodules in the left lower lobe on
image number 11 and 1 3 mm subpleural nodule in the periphery of the
left upper lobe on image 12. There is also a 6 mm semi-solid
subpleural triangular-shaped nodule in the left lower lobe on image
13.

Small thoracolumbar spine Schmorl's nodes. Minimal lower thoracic
spine degenerative changes.
IMPRESSION: 1. No acute abnormality.
2. Multiple small subpleural nodules at both lung bases, as
described above. These are most likely post infectious in origin.

## 2014-06-23 MED ORDER — PANTOPRAZOLE SODIUM 40 MG PO TBEC
40.0000 mg | DELAYED_RELEASE_TABLET | Freq: Once | ORAL | Status: AC
Start: 2014-06-23 — End: 2014-06-23
  Administered 2014-06-23: 40 mg via ORAL
  Filled 2014-06-23: qty 1

## 2014-06-23 MED ORDER — FAMOTIDINE 20 MG PO TABS
20.0000 mg | ORAL_TABLET | Freq: Once | ORAL | Status: AC
  Administered 2014-06-23: 20 mg via ORAL
  Filled 2014-06-23: qty 1

## 2014-06-23 MED ORDER — IOHEXOL 300 MG/ML  SOLN
100.0000 mL | Freq: Once | INTRAMUSCULAR | Status: AC | PRN
  Administered 2014-06-23: 100 mL via INTRAVENOUS

## 2014-06-23 MED ORDER — HYDROMORPHONE HCL 1 MG/ML IJ SOLN
0.5000 mg | Freq: Once | INTRAMUSCULAR | Status: AC
  Administered 2014-06-23: 0.5 mg via INTRAVENOUS
  Filled 2014-06-23: qty 1

## 2014-06-23 MED ORDER — SODIUM CHLORIDE 0.9 % IJ SOLN
INTRAMUSCULAR | Status: AC
  Filled 2014-06-23: qty 30

## 2014-06-23 MED ORDER — HYDROCODONE-ACETAMINOPHEN 5-325 MG PO TABS: 1.0000 | ORAL_TABLET | ORAL | Status: DC | PRN

## 2014-06-23 MED ORDER — IOHEXOL 300 MG/ML  SOLN
50.0000 mL | Freq: Once | INTRAMUSCULAR | Status: AC | PRN
  Administered 2014-06-23: 50 mL via ORAL

## 2014-06-23 MED ORDER — SODIUM CHLORIDE 0.9 % IJ SOLN
INTRAMUSCULAR | Status: AC
  Filled 2014-06-23: qty 750

## 2014-06-23 MED ORDER — ONDANSETRON HCL 4 MG/2ML IJ SOLN
4.0000 mg | Freq: Once | INTRAMUSCULAR | Status: AC
  Administered 2014-06-23: 4 mg via INTRAVENOUS
  Filled 2014-06-23: qty 2

## 2014-06-23 NOTE — ED Notes (Signed)
Pt c/o abd pain with nausea that started today. Meryl Crutch PA in prior to RN, see edp assessment for further,

## 2014-06-23 NOTE — ED Notes (Signed)
Pt placed on cardiac monitor, NSR noted, placed in position of comfort

## 2014-06-23 NOTE — ED Provider Notes (Signed)
CSN: [id number]     Arrival date & time 06/23/14  1122 History   First MD Initiated Contact with Patient 06/23/14 1138    This chart was scribed for Lily Kocher, PA-C, working with Virgel Manifold, MD by Girtha Hake, ED Scribe. The patient was seen in APA07/APA07. The patient's care was started at 11:39 AM.   Chief Complaint  Patient presents with  . Back Pain  . Abdominal Pain   Patient is a 34 y.o. female presenting with back pain and abdominal pain. The history is provided by the patient. No language interpreter was used.  Back Pain Associated symptoms: abdominal pain   Associated symptoms: no dysuria and no fever   Abdominal Pain Pain location:  L flank and LLQ Pain radiates to:  L flank Pain severity:  Severe (8/10) Onset quality:  Sudden Duration:  4 hours Timing:  Constant Progression:  Unchanged Chronicity:  New Context: not diet changes   Relieved by:  Nothing Associated symptoms: diarrhea and nausea   Associated symptoms: no dysuria, no fever and no vomiting    HPI Comments: Lauren [name] is a 34 y.o. female with a history of DM who presents to the Emergency Department complaining of severe, LLQ abdominal pain beginning approximately four hours ago. She characterizes the pain as an 8/10 in severity.  Patient states that the pain is constant and reports that it radiates to her left flank. She reports associated nausea but denies vomiting. Patient denies and recent trauma to her abdomen, fever, vomiting, or hematochezia. She also reports diarrhea but states that this is normal for her. Patient has a history of cholecystectomy.   PCP is Dr. Karie Kirks.   Past Medical History  Diagnosis Date  . Diabetes mellitus without complication   . Renal disorder    Past Surgical History  Procedure Laterality Date  . Cholecystectomy    . Tubal ligation     No family history on file. History  Substance Use Topics  . Smoking status: Not on file  . Smokeless tobacco: Not on  file  . Alcohol Use: Not on file   OB History    No data available     Review of Systems  Constitutional: Negative for fever.  Gastrointestinal: Positive for nausea, abdominal pain and diarrhea. Negative for vomiting and blood in stool.  Genitourinary: Negative for dysuria.  Musculoskeletal: Positive for back pain.  All other systems reviewed and are negative.     Allergies  Ciprofloxacin hcl and Wellbutrin  Home Medications   Prior to Admission medications   Not on File   Triage Vitals: BP 120/85 mmHg  Pulse 110  Temp(Src) 98.2 F (36.8 C) (Oral)  Resp 18  Ht 5\' 5"  (1.651 m)  Wt 200 lb (90.719 kg)  BMI 33.28 kg/m2  SpO2 100%  LMP 06/02/2014 Physical Exam  Constitutional: She is oriented to person, place, and time. She appears well-developed and well-nourished. No distress.  HENT:  Head: Normocephalic and atraumatic.  Eyes: Conjunctivae and EOM are normal.  Neck: Neck supple. No tracheal deviation present.  Cardiovascular: Regular rhythm and normal heart sounds.  Tachycardia present.   Pulmonary/Chest: Effort normal. No respiratory distress. She has no wheezes. She has no rales.  Abdominal: Bowel sounds are normal. She exhibits no mass. There is tenderness. There is guarding.  TTP in LLQ. Mild left flank pain.  Musculoskeletal: Normal range of motion. She exhibits no edema or tenderness.  Neurological: She is alert and oriented to person, place, and  time. She exhibits normal muscle tone. Coordination normal.  Skin: Skin is warm and dry.  Psychiatric: She has a normal mood and affect. Her behavior is normal.  Nursing note and vitals reviewed.   ED Course  Procedures (including critical care time) DIAGNOSTIC STUDIES: Oxygen Saturation is 100% on room air, normal by my interpretation.    COORDINATION OF CARE: 11:47 AM-Discussed treatment plan which includes Dilaudid, Zofran, abdominal Ct, and labs with pt at bedside and pt agreed to plan.   2:22 PM - Informed  patient of results of pregnancy test, urinalysis, CBC, and other labs.    Labs Review Labs Reviewed - No data to display  Imaging Review No results found.   EKG Interpretation None      MDM  Urine preg test neg. UA reveals many bacteria and triple phosphate crystals.  CBC reveals wbc elevated at 11.8, o/w wnl. Bmet  Glucose elevated at 216. CT scan suggest multiple subpleural nodkules in both lungs c/w scaring following infectious process. Left ovary has a 2.7 cm follicle. No other problem.  Pt advised of findings. F/U with gyn MD and PCp concerning lung nodules. Rx for norco given to patient. Pt to return if any changes or problem.    Final diagnoses:  None    *I have reviewed nursing notes, vital signs, and all appropriate lab and imaging results for this patient.**  **I personally performed the services described in this documentation, which was scribed in my presence. The recorded information has been reviewed and is accurate.Lenox Ahr, PA-C 06/23/14 2113  Virgel Manifold, MD 06/27/14 1026

## 2014-06-23 NOTE — ED Notes (Signed)
Having abdominal pain and back pain since this am.  Rates pain 8-9.  Took with no relief.

## 2014-06-23 NOTE — Discharge Instructions (Signed)
Your labs, urine test, and CT are negative for acute problem, there is change in the left ovary compared to the right. Please have your Gyn MD evaluate this in the office. Your have what appears to be scar tissue in the right and left lung. Please have Dr Karie Kirks recheck this in the office.  Abdominal Pain Many things can cause belly (abdominal) pain. Most times, the belly pain is not dangerous. Many cases of belly pain can be watched and treated at home. HOME CARE   Do not take medicines that help you go poop (laxatives) unless told to by your doctor.  Only take medicine as told by your doctor.  Eat or drink as told by your doctor. Your doctor will tell you if you should be on a special diet. GET HELP IF:  You do not know what is causing your belly pain.  You have belly pain while you are sick to your stomach (nauseous) or have runny poop (diarrhea).  You have pain while you pee or poop.  Your belly pain wakes you up at night.  You have belly pain that gets worse or better when you eat.  You have belly pain that gets worse when you eat fatty foods.  You have a fever. GET HELP RIGHT AWAY IF:   The pain does not go away within 2 hours.  You keep throwing up (vomiting).  The pain changes and is only in the right or left part of the belly.  You have bloody or tarry looking poop. MAKE SURE YOU:   Understand these instructions.  Will watch your condition.  Will get help right away if you are not doing well or get worse. Document Released: 10/06/2007 Document Revised: 04/24/2013 Document Reviewed: 12/27/2012 [facility] Patient Information 2015 [place], [place]. This information is not intended to replace advice given to you by your health care provider. Make sure you discuss any questions you have with your health care provider.

## 2014-06-23 NOTE — ED Notes (Signed)
Pt c/o heartburn, HObson PA notified, additional orders given,

## 2014-06-25 LAB — URINE CULTURE
Colony Count: >=100000
Special Requests: NORMAL

## 2014-11-27 IMAGING — US US RENAL
1 series · 14 of 25 positions shown · non-contrast
Comparison: [DATE]

CLINICAL DATA: Chronic renal disease

EXAM:
RENAL / URINARY TRACT ULTRASOUND COMPLETE

[Series 1: us renal · 0.21mm/px · 14 of 32 slices shown]
[im 1/32]
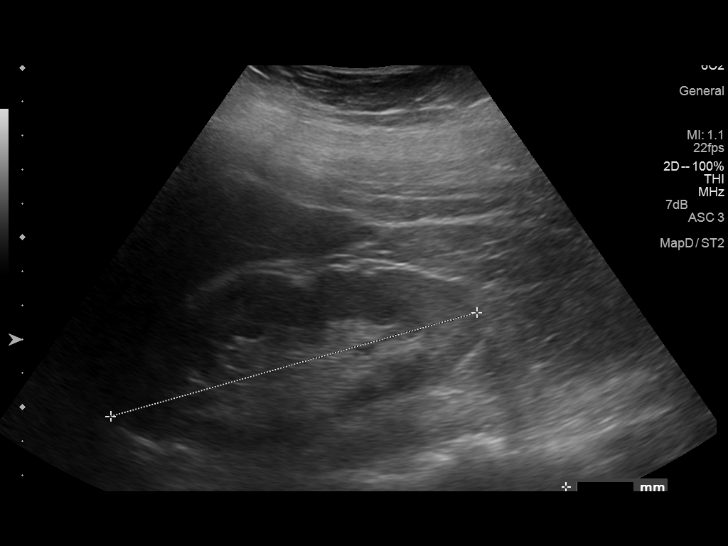
[im 3/32]
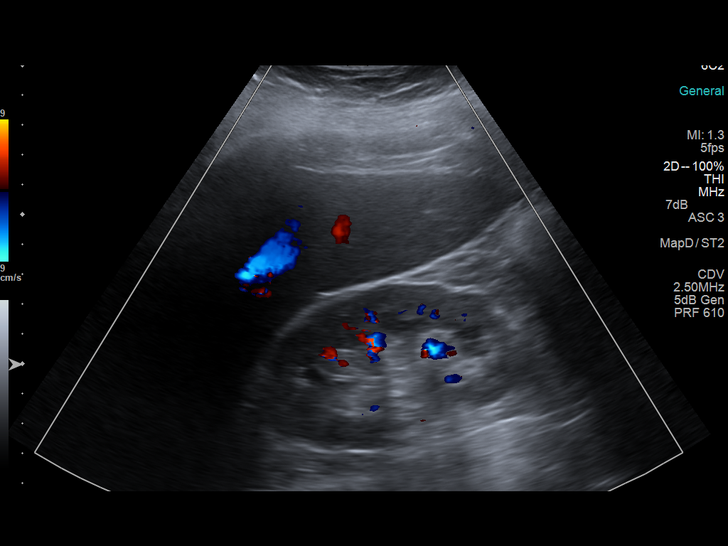
[im 6/32]
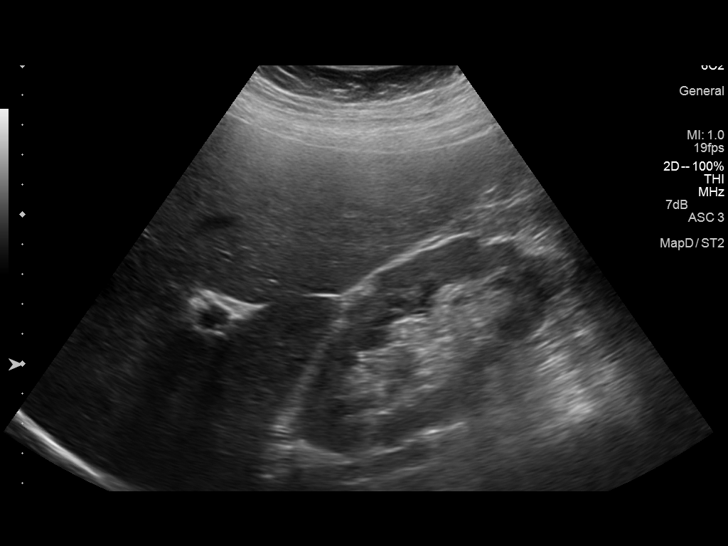
[im 8/32]
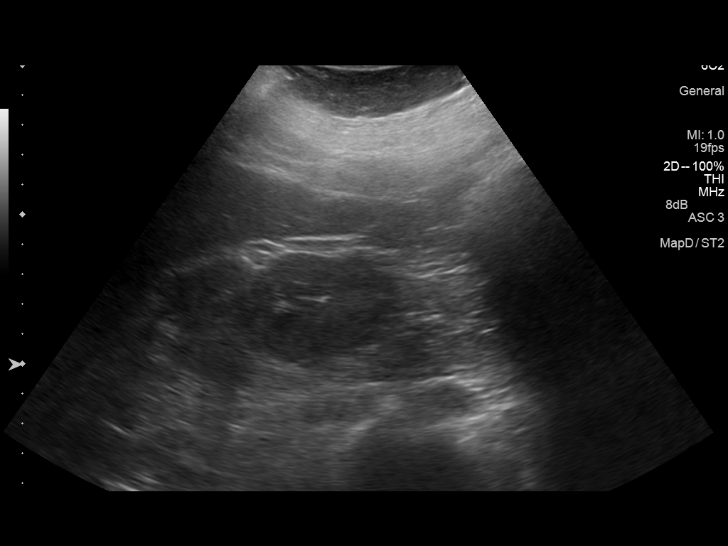
[im 11/32]
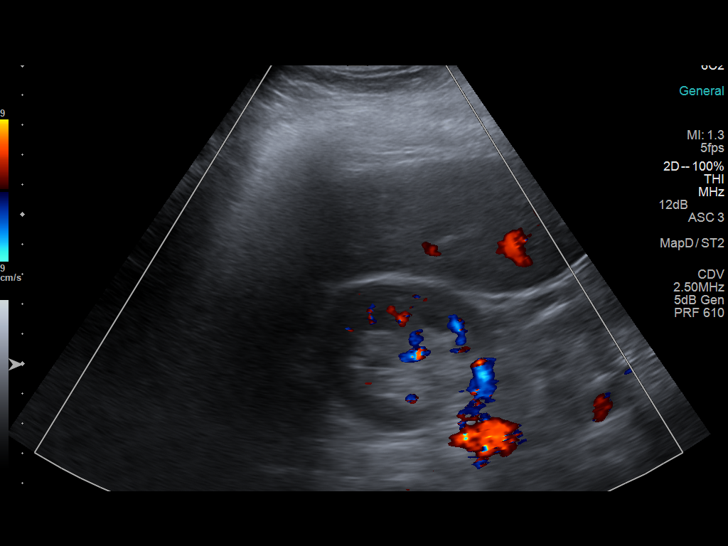
[im 12/32]
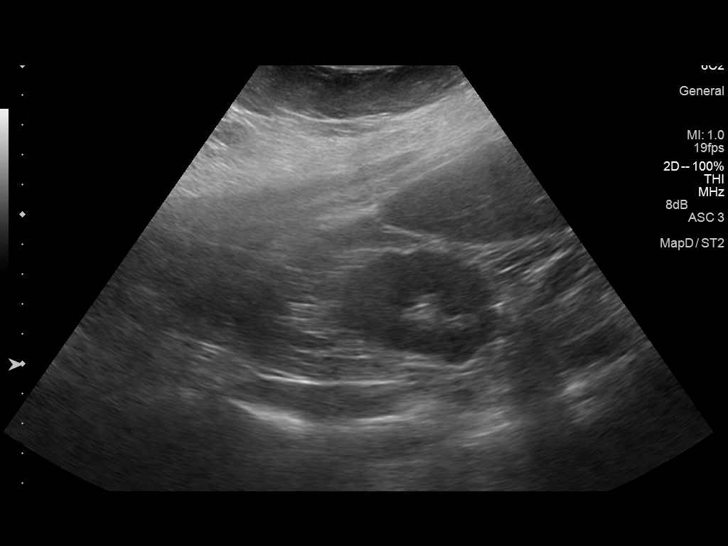
[im 15/32]
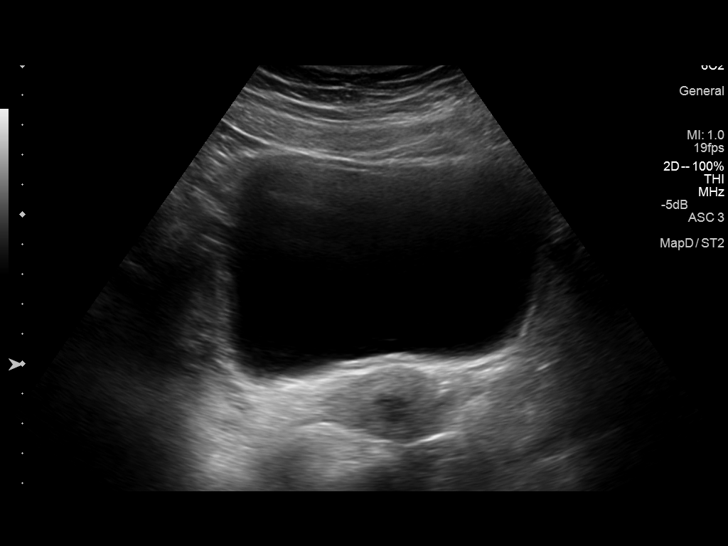
[im 17/32]
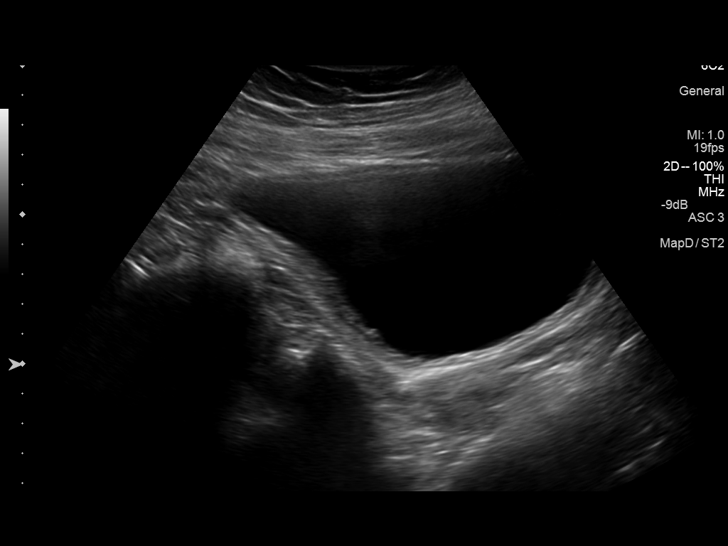
[im 20/32]
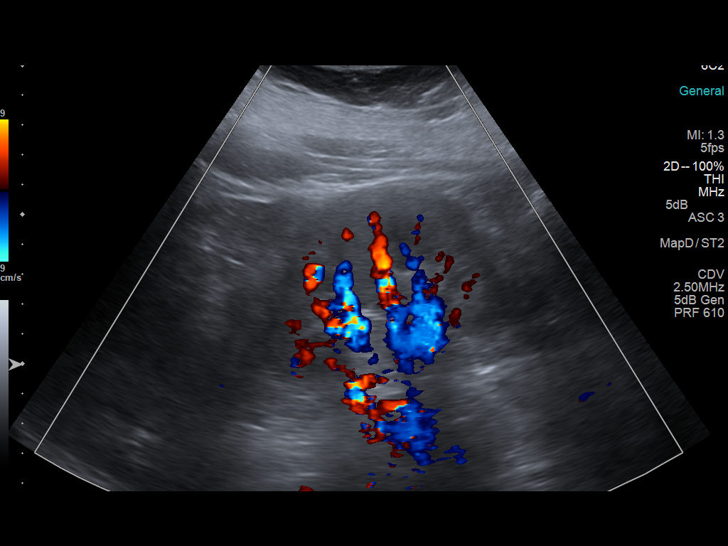
[im 21/32]
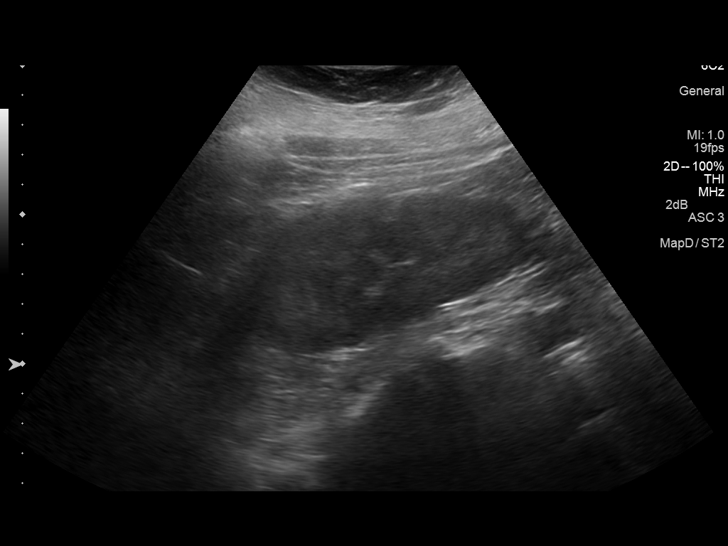
[im 24/32]
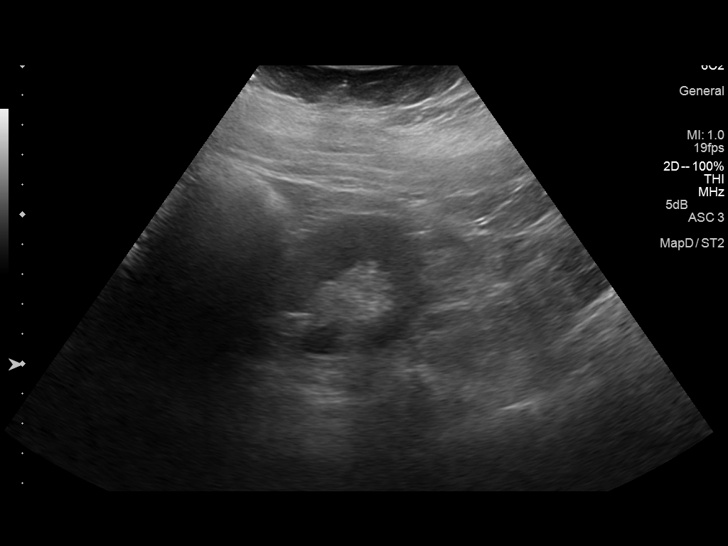
[im 26/32]
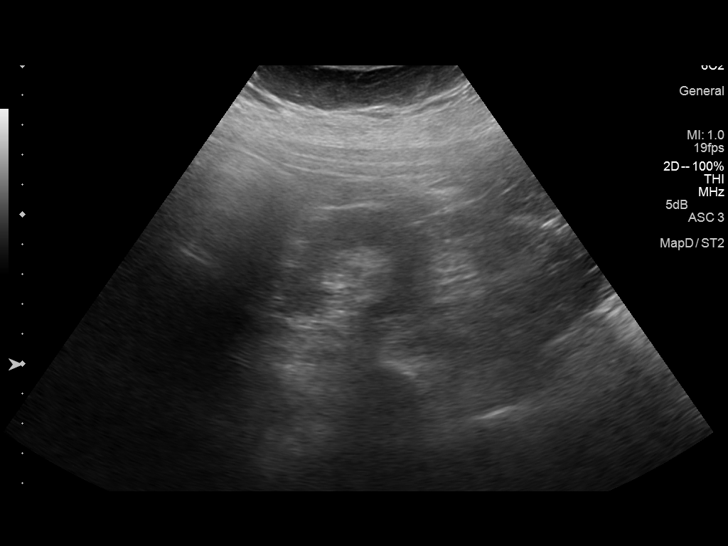
[im 29/32]
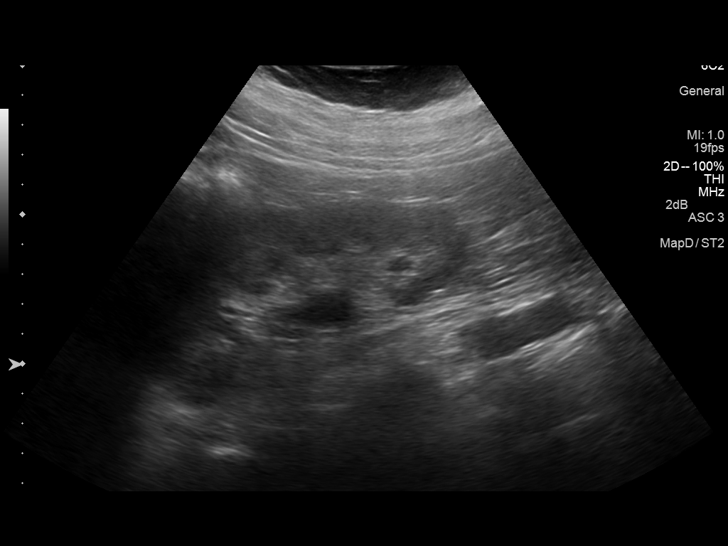
[im 32/32]
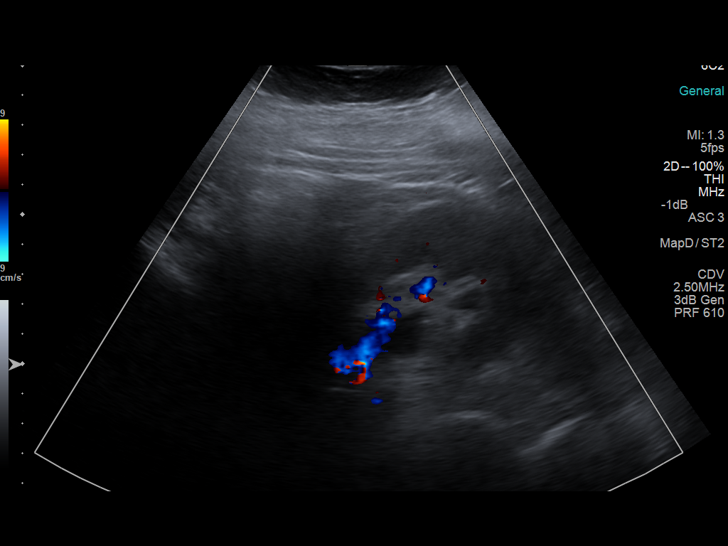

[14 of 25 positions shown; findings below may reference images not displayed]

FINDINGS: Right Kidney:

Length: 11.2 cm.. Echogenicity within normal limits. No mass or
hydronephrosis visualized.

Left Kidney:

Length: 11.4 cm.. Minimal fullness of the renal pelvis is noted. No
obstructive changes are seen.

Bladder:

Appears normal for degree of bladder distention.
IMPRESSION: Minimal fullness of the left renal pelvis. No true obstructive
changes are noted.

## 2015-01-08 ENCOUNTER — Other Ambulatory Visit (HOSPITAL_COMMUNITY): Payer: Self-pay | Admitting: Nephrology

## 2015-01-08 DIAGNOSIS — N183 Chronic kidney disease, stage 3 unspecified: Secondary | ICD-10-CM

## 2015-01-23 ENCOUNTER — Ambulatory Visit (HOSPITAL_COMMUNITY)
Admission: RE | Admit: 2015-01-23 | Discharge: 2015-01-23 | Disposition: A | Discharge status: Home / Self Care | Payer: Medicaid Other | Source: Ambulatory Visit | Attending: Nephrology | Admitting: Nephrology

## 2015-01-23 DIAGNOSIS — N183 Chronic kidney disease, stage 3 unspecified: Secondary | ICD-10-CM

## 2015-03-07 ENCOUNTER — Encounter: Payer: Self-pay | Admitting: "Endocrinology

## 2015-03-07 ENCOUNTER — Other Ambulatory Visit: Payer: Self-pay | Admitting: "Endocrinology

## 2015-03-07 ENCOUNTER — Ambulatory Visit (INDEPENDENT_AMBULATORY_CARE_PROVIDER_SITE_OTHER): Payer: Medicaid Other | Admitting: "Endocrinology

## 2015-03-07 VITALS — BP 135/90 | HR 99 | Ht 65.0 in | Wt 218.0 lb

## 2015-03-07 DIAGNOSIS — E1059 Type 1 diabetes mellitus with other circulatory complications: Secondary | ICD-10-CM | POA: Diagnosis not present

## 2015-03-07 DIAGNOSIS — E039 Hypothyroidism, unspecified: Secondary | ICD-10-CM | POA: Insufficient documentation

## 2015-03-07 MED ORDER — GLUCOSE BLOOD VI STRP
ORAL_STRIP | Status: DC
Start: 2015-03-07 — End: 2018-09-15

## 2015-03-07 MED ORDER — INSULIN PEN NEEDLE 31G X 8 MM MISC: 1.0000 | Status: DC

## 2015-03-07 MED ORDER — INSULIN GLARGINE 100 UNIT/ML SOLOSTAR PEN: 24.0000 [IU] | PEN_INJECTOR | Freq: Every morning | SUBCUTANEOUS | Status: DC

## 2015-03-07 MED ORDER — INSULIN LISPRO 100 UNIT/ML (KWIKPEN): 5.0000 [IU] | PEN_INJECTOR | Freq: Two times a day (BID) | SUBCUTANEOUS | Status: DC

## 2015-03-07 NOTE — Patient Instructions (Signed)

## 2015-03-08 LAB — TSH: TSH: 40.98 u[IU]/mL — ABNORMAL HIGH (ref 0.450–4.500)

## 2015-03-08 LAB — HGB A1C W/O EAG: HEMOGLOBIN A1C: 12.6 % — AB (ref 4.8–5.6)

## 2015-03-08 LAB — T4, FREE: Free T4: 0.77 ng/dL — ABNORMAL LOW (ref 0.82–1.77)

## 2015-03-08 NOTE — Progress Notes (Signed)
Subjective:    Patient ID: Lauren [name], female    DOB: 07/09/1980,    Past Medical History  Diagnosis Date  . Diabetes mellitus without complication ([place])   . Renal disorder    Past Surgical History  Procedure Laterality Date  . Cholecystectomy    . Tubal ligation    . Cardiac catherization     Social History   Social History  . Marital Status: Single    Spouse Name: N/A  . Number of Children: N/A  . Years of Education: N/A   Social History Main Topics  . Smoking status: Current Every Day Smoker -- 1.00 packs/day    Types: Cigarettes  . Smokeless tobacco: None  . Alcohol Use: No  . Drug Use: No  . Sexual Activity: Not Asked   Other Topics Concern  . None   Social History Narrative   Outpatient Encounter Prescriptions as of 03/07/2015  Medication Sig  . furosemide (LASIX) 40 MG tablet Take 40 mg by mouth daily.  Marland Kitchen HYDROcodone-acetaminophen (NORCO/VICODIN) 5-325 MG per tablet Take 1-2 tablets by mouth every 4 (four) hours as needed.  . Insulin Human (INSULIN PUMP) SOLN Inject 1 each into the skin continuous. humalog  . levothyroxine (SYNTHROID, LEVOTHROID) 150 MCG tablet Take 150 mcg by mouth daily before breakfast.  . lisinopril (PRINIVIL,ZESTRIL) 2.5 MG tablet Take 2.5 mg by mouth daily.  . pantoprazole (PROTONIX) 40 MG tablet Take 40 mg by mouth 2 (two) times daily.  . Potassium Chloride Crys CR (KLOR-CON M20 PO) Take by mouth daily.  . Vitamin D, Ergocalciferol, (DRISDOL) 50000 UNITS CAPS capsule Take 50,000 Units by mouth every 7 (seven) days.  . [DISCONTINUED] insulin lispro (HUMALOG) 100 UNIT/ML injection Inject into the skin 3 (three) times daily before meals. Via pump  . glucose blood (ACCU-CHEK AVIVA) test strip To test upto 6 times a day  . Insulin Glargine (LANTUS SOLOSTAR) 100 UNIT/ML Solostar Pen Inject 24 Units into the skin every morning.  . insulin lispro (HUMALOG KWIKPEN) 100 UNIT/ML KiwkPen Inject 0.05-0.11 mLs (5-11 Units total) into the  skin 2 (two) times daily with a meal.  . Insulin Pen Needle (B-D ULTRAFINE III SHORT PEN) 31G X 8 MM MISC 1 each by Does not apply route as directed.   No facility-administered encounter medications on file as of 03/07/2015.   ALLERGIES: Allergies  Allergen Reactions  . Ciprofloxacin Hcl   . Wellbutrin [Bupropion]    VACCINATION STATUS:  There is no immunization history on file for this patient.  Diabetes She presents for her initial diabetic visit. She has type 1 diabetes mellitus. Onset time: She was diagnosed at age 34 years. Her disease course has been worsening. There are no hypoglycemic associated symptoms. Pertinent negatives for hypoglycemia include no confusion, headaches, pallor or seizures. Associated symptoms include fatigue, polydipsia and polyuria. Pertinent negatives for diabetes include no polyphagia. There are no hypoglycemic complications. Symptoms are worsening. There are no diabetic complications. (She did have several attacks of diabetes ketoacidosis, last one 2 years ago.) Risk factors for coronary artery disease include family history, hypertension, sedentary lifestyle, obesity and tobacco exposure. Current diabetic treatment includes insulin pump. She is compliant with treatment some of the time (She is on. combo insulin pump. She is not monitoring blood glucose regularly. She uses Humalog 1.5 units insulin pump for 6 years. She did have several diabetic ketoacidosis episodes). Her weight is increasing steadily. She is following a generally unhealthy diet. She has not had a previous  visit with a dietitian. She rarely participates in exercise. Home blood sugar record trend: She does not monitor BG regularly. she says she monitors "sometimes". An ACE inhibitor/angiotensin II receptor blocker is being taken. Eye exam is not current.  Hypertension This is a chronic problem. The current episode started more than 1 year ago. Pertinent negatives include no headaches or palpitations.  Past treatments include ACE inhibitors. Hypertensive end-organ damage includes a thyroid problem.  Thyroid Problem Presents for initial visit. Symptoms include fatigue. Patient reports no cold intolerance, diarrhea, heat intolerance or palpitations. Past treatments include levothyroxine. The following procedures have not been performed: radioiodine uptake scan and thyroidectomy.      Review of Systems  Constitutional: Positive for fatigue. Negative for unexpected weight change.  HENT: Negative for trouble swallowing and voice change.   Eyes: Negative for visual disturbance.  Respiratory: Negative for cough and wheezing.   Cardiovascular: Negative for palpitations and leg swelling.  Gastrointestinal: Negative for nausea, vomiting and diarrhea.  Endocrine: Positive for polydipsia and polyuria. Negative for cold intolerance, heat intolerance and polyphagia.  Musculoskeletal: Negative for arthralgias.  Skin: Negative for color change, pallor, rash and wound.  Neurological: Negative for seizures and headaches.  Psychiatric/Behavioral: Negative for suicidal ideas and confusion.    Objective:    BP 135/90 mmHg  Pulse 99  Ht 5\' 5"  (1.651 m)  Wt 218 lb (98.884 kg)  BMI 36.28 kg/m2  SpO2 99%  Wt Readings from Last 3 Encounters:  03/07/15 218 lb (98.884 kg)  06/23/14 200 lb (90.719 kg)    Physical Exam  Constitutional: She is oriented to person, place, and time. She appears well-developed.  HENT:  Head: Normocephalic and atraumatic.  Eyes: EOM are normal.  Neck: Normal range of motion. Neck supple. No tracheal deviation present. No thyromegaly present.  Cardiovascular: Normal rate and regular rhythm.   Pulmonary/Chest: Effort normal and breath sounds normal.  Abdominal: Soft. Bowel sounds are normal. There is no tenderness. There is no guarding.  Musculoskeletal: Normal range of motion. She exhibits no edema.  Neurological: She is alert and oriented to person, place, and time. She has  normal reflexes. No cranial nerve deficit. Coordination normal.  Skin: Skin is warm and dry. No rash noted. No erythema. No pallor.  Psychiatric: She has a normal mood and affect. Judgment normal.    Results for orders placed or performed during the hospital encounter of 06/23/14  Urine culture  Result Value Ref Range   Specimen Description URINE, CLEAN CATCH    Special Requests Normal    Colony Count      >=100,000 COLONIES/ML Performed at [facility]    Culture      Multiple bacterial morphotypes present, none predominant. Suggest appropriate recollection if clinically indicated. Performed at [facility]    Report Status 06/25/2014 FINAL   CBC with Differential  Result Value Ref Range   WBC 11.8 (H) 4.0 - 10.5 K/uL   RBC 4.65 3.87 - 5.11 MIL/uL   Hemoglobin 13.5 12.0 - 15.0 g/dL   HCT 40.8 36.0 - 46.0 %   MCV 87.7 78.0 - 100.0 fL   MCH 29.0 26.0 - 34.0 pg   MCHC 33.1 30.0 - 36.0 g/dL   RDW 12.7 11.5 - 15.5 %   Platelets 284 150 - 400 K/uL   Neutrophils Relative % 89 (H) 43 - 77 %   Neutro Abs 10.5 (H) 1.7 - 7.7 K/uL   Lymphocytes Relative 6 (L) 12 - 46 %  Lymphs Abs 0.7 0.7 - 4.0 K/uL   Monocytes Relative 3 3 - 12 %   Monocytes Absolute 0.3 0.1 - 1.0 K/uL   Eosinophils Relative 2 0 - 5 %   Eosinophils Absolute 0.2 0.0 - 0.7 K/uL   Basophils Relative 0 0 - 1 %   Basophils Absolute 0.0 0.0 - 0.1 K/uL  Basic metabolic panel  Result Value Ref Range   Sodium 134 (L) 135 - 145 mmol/L   Potassium 4.5 3.5 - 5.1 mmol/L   Chloride 111 96 - 112 mmol/L   CO2 19 19 - 32 mmol/L   Glucose, Bld 216 (H) 70 - 99 mg/dL   BUN 23 6 - 23 mg/dL   Creatinine, Ser 1.02 0.50 - 1.10 mg/dL   Calcium 8.0 (L) 8.4 - 10.5 mg/dL   GFR calc non Af Amer 72 (L) >90 mL/min   GFR calc Af Amer 84 (L) >90 mL/min   Anion gap 4 (L) 5 - 15  Urinalysis, Routine w reflex microscopic  Result Value Ref Range   Color, Urine YELLOW YELLOW   APPearance HAZY (A) CLEAR   Specific Gravity,  Urine 1.025 1.005 - 1.030   pH 7.5 5.0 - 8.0   Glucose, UA 100 (A) NEGATIVE mg/dL   Hgb urine dipstick NEGATIVE NEGATIVE   Bilirubin Urine NEGATIVE NEGATIVE   Ketones, ur NEGATIVE NEGATIVE mg/dL   Protein, ur 100 (A) NEGATIVE mg/dL   Urobilinogen, UA 0.2 0.0 - 1.0 mg/dL   Nitrite NEGATIVE NEGATIVE   Leukocytes, UA NEGATIVE NEGATIVE  Urine microscopic-add on  Result Value Ref Range   Squamous Epithelial / LPF MANY (A) RARE   WBC, UA 3-6 <3 WBC/hpf   RBC / HPF 0-2 <3 RBC/hpf   Bacteria, UA MANY (A) RARE   Crystals TRIPLE PHOSPHATE CRYSTALS (A) NEGATIVE  POC urine preg, ED (not at [facility])  Result Value Ref Range   Preg Test, Ur NEGATIVE NEGATIVE   Complete Blood Count (Most recent): Lab Results  Component Value Date   WBC 11.8* 06/23/2014   HGB 13.5 06/23/2014   HCT 40.8 06/23/2014   MCV 87.7 06/23/2014   PLT 284 06/23/2014   Chemistry (most recent): Lab Results  Component Value Date   NA 134* 06/23/2014   K 4.5 06/23/2014   CL 111 06/23/2014   CO2 19 06/23/2014   BUN 23 06/23/2014   CREATININE 1.02 06/23/2014   Diabetic Labs (most recent): No results found for: HGBA1C Lipid profile (most recent): No results found for: TRIG, CHOL       Assessment & Plan:   1. 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 Recent labs reviewed.   Her diabetes is complicated by coronary artery disease and patient remains at a high risk for more acute and chronic complications of diabetes which include CAD, CVA, CKD, retinopathy, and neuropathy. These are all discussed in detail with the patient.  - I have counseled the patient on diet management and weight loss, by adopting a carbohydrate restricted/protein rich diet.  - Suggestion is made for patient to avoid simple carbohydrates   from their diet including Cakes , Desserts, Ice Cream,  Soda (  diet and  regular) , Sweet Tea , Candies,  Chips, Cookies, Artificial Sweeteners,   and "Sugar-free" Products . This will help patient to have stable blood glucose profile and  potentially avoid unintended weight gain.  - I encouraged the patient to switch to  unprocessed or minimally processed complex starch and increased protein intake (animal or plant source), fruits, and vegetables.  - Patient is advised to stick to a routine mealtimes to eat 3 meals  a day and avoid unnecessary snacks ( to snack only to correct hypoglycemia).  - The patient will be scheduled with Jearld Fenton, RDN, CDE for individualized DM education.  - I have approached patient with the following individualized plan to manage diabetes and patient agrees:   - Due to inappropriate use of insulin pump, patient's frequent attack of DKA, and she her inadequate commitment to monitor blood glucose, the pump poses more risk than benefit to her. -I approached her to switch to MDI for better control of her type 1 diabetes and she agrees.Marland Kitchen - I  will proceed to initiate basal insulin Lantus 28 units QHS, and prandial insulin Humalog 10 units TIDAC for pre-meal BG readings of 90-150mg /dl, plus patient specific correction dose for unexpected hyperglycemia above 150mg /dl, associated with strict monitoring of glucose  AC and HS. - Patient is warned not to take insulin without proper monitoring per orders. -Adjustment parameters are given for hypo and hyperglycemia in writing. -Patient is encouraged to call clinic for blood glucose levels less than 70 or above 300 mg /dl. - she is not a candidate for metformin,SGLT2 inhibitors, and incretin therapy.  -She will d/c her pump one hour after her first Humalog injection. - Patient specific target  A1c;  LDL, HDL, Triglycerides, and  Waist Circumference were discussed in detail.  2) BP/HTN:  Continue current medications including ACEI/ARB. 3) Lipids/HPL: Lipid levels unknown. I will request for fasting  lipid panel.  4)  Weight/Diet: CDE Consult will be initiated , exercise, and detailed carbohydrates information provided. 5) Primary hypothyroidism -I advised her to continue levothyroxine 150 g by mouth every morning.  - We discussed about correct intake of levothyroxine, at fasting, with water, separated by at least 30 minutes from breakfast, and separated by more than 4 hours from calcium, iron, multivitamins, acid reflux medications (PPIs). -Patient is made aware of the fact that thyroid hormone replacement is needed for life, dose to be adjusted by periodic monitoring of thyroid function tests.  6) Chronic Care/Health Maintenance:  -Patient is on ACEI and medications and encouraged to continue to follow up with Ophthalmology, Podiatrist at least yearly or according to recommendations, and advised to   Quite smoking. I have recommended yearly flu vaccine and pneumonia vaccination at least every 5 years; moderate intensity exercise for up to 150 minutes weekly; and  sleep for at least 7 hours a day.   Patient to bring meter and  blood glucose logs during their next visit.   60 minutes of time was spent on this patient's care, 30 minutes of which has been spent on counseling.  I advised patient to maintain close follow up with their PCP for primary care needs. Follow up plan: Return in about 1 week (around 03/14/2015) for diabetes, underactive thyroid.  Glade Lloyd, MD Phone: [phone]  Fax: [phone]   03/08/2015, 8:54 AM

## 2015-03-17 ENCOUNTER — Ambulatory Visit (INDEPENDENT_AMBULATORY_CARE_PROVIDER_SITE_OTHER): Payer: Medicaid Other | Admitting: "Endocrinology

## 2015-03-17 ENCOUNTER — Encounter: Payer: Self-pay | Admitting: "Endocrinology

## 2015-03-17 VITALS — BP 119/77 | HR 103 | Ht 65.0 in | Wt 219.0 lb

## 2015-03-17 DIAGNOSIS — E039 Hypothyroidism, unspecified: Secondary | ICD-10-CM | POA: Diagnosis not present

## 2015-03-17 DIAGNOSIS — E1059 Type 1 diabetes mellitus with other circulatory complications: Secondary | ICD-10-CM | POA: Diagnosis not present

## 2015-03-17 DIAGNOSIS — I10 Essential (primary) hypertension: Secondary | ICD-10-CM | POA: Insufficient documentation

## 2015-03-17 MED ORDER — INSULIN GLARGINE 100 UNIT/ML SOLOSTAR PEN: 32.0000 [IU] | PEN_INJECTOR | Freq: Every morning | SUBCUTANEOUS | Status: DC

## 2015-03-17 MED ORDER — INSULIN LISPRO 100 UNIT/ML (KWIKPEN): 10.0000 [IU] | PEN_INJECTOR | Freq: Three times a day (TID) | SUBCUTANEOUS | Status: DC

## 2015-03-17 NOTE — Patient Instructions (Signed)

## 2015-03-17 NOTE — Progress Notes (Signed)
Subjective:    Patient ID: Lauren [name], female    DOB: 1981/03/23,    Past Medical History  Diagnosis Date  . Diabetes mellitus without complication ([place])   . Renal disorder    Past Surgical History  Procedure Laterality Date  . Cholecystectomy    . Tubal ligation    . Cardiac catherization     Social History   Social History  . Marital Status: Single    Spouse Name: N/A  . Number of Children: N/A  . Years of Education: N/A   Social History Main Topics  . Smoking status: Current Every Day Smoker -- 1.00 packs/day    Types: Cigarettes  . Smokeless tobacco: None  . Alcohol Use: No  . Drug Use: No  . Sexual Activity: Not Asked   Other Topics Concern  . None   Social History Narrative   Outpatient Encounter Prescriptions as of 03/17/2015  Medication Sig  . furosemide (LASIX) 40 MG tablet Take 40 mg by mouth daily.  Marland Kitchen glucose blood (ACCU-CHEK AVIVA) test strip To test upto 6 times a day  . HYDROcodone-acetaminophen (NORCO/VICODIN) 5-325 MG per tablet Take 1-2 tablets by mouth every 4 (four) hours as needed.  . Insulin Glargine (LANTUS SOLOSTAR) 100 UNIT/ML Solostar Pen Inject 32 Units into the skin every morning.  . insulin lispro (HUMALOG KWIKPEN) 100 UNIT/ML KiwkPen Inject 0.1-0.16 mLs (10-16 Units total) into the skin 3 (three) times daily before meals.  . Insulin Pen Needle (B-D ULTRAFINE III SHORT PEN) 31G X 8 MM MISC 1 each by Does not apply route as directed.  Marland Kitchen levothyroxine (SYNTHROID, LEVOTHROID) 150 MCG tablet Take 150 mcg by mouth daily before breakfast.  . lisinopril (PRINIVIL,ZESTRIL) 2.5 MG tablet Take 2.5 mg by mouth daily.  . pantoprazole (PROTONIX) 40 MG tablet Take 40 mg by mouth 2 (two) times daily.  . Potassium Chloride Crys CR (KLOR-CON M20 PO) Take by mouth daily.  . Vitamin D, Ergocalciferol, (DRISDOL) 50000 UNITS CAPS capsule Take 50,000 Units by mouth every 7 (seven) days.  . [DISCONTINUED] Insulin Glargine (LANTUS SOLOSTAR) 100  UNIT/ML Solostar Pen Inject 24 Units into the skin every morning.  . [DISCONTINUED] Insulin Human (INSULIN PUMP) SOLN Inject 1 each into the skin continuous. humalog  . [DISCONTINUED] insulin lispro (HUMALOG KWIKPEN) 100 UNIT/ML KiwkPen Inject 0.05-0.11 mLs (5-11 Units total) into the skin 2 (two) times daily with a meal.   No facility-administered encounter medications on file as of 03/17/2015.   ALLERGIES: Allergies  Allergen Reactions  . Ciprofloxacin Hcl   . Wellbutrin [Bupropion]    VACCINATION STATUS:  There is no immunization history on file for this patient.  Diabetes She presents for her initial diabetic visit. She has type 1 diabetes mellitus. Onset time: She was diagnosed at age 30 years. Her disease course has been improving. There are no hypoglycemic associated symptoms. Pertinent negatives for hypoglycemia include no confusion, headaches, pallor or seizures. Associated symptoms include fatigue, polydipsia and polyuria. Pertinent negatives for diabetes include no polyphagia. There are no hypoglycemic complications. Symptoms are improving. There are no diabetic complications. (She did have several attacks of diabetes ketoacidosis, last one 2 years ago.) Risk factors for coronary artery disease include family history, hypertension, sedentary lifestyle, obesity and tobacco exposure. Current diabetic treatment includes insulin pump. She is compliant with treatment most of the time. Her weight is stable. She is following a generally unhealthy diet. She has not had a previous visit with a dietitian. She rarely participates  in exercise. Her home blood glucose trend is decreasing steadily. Her overall blood glucose range is 180-200 mg/dl. An ACE inhibitor/angiotensin II receptor blocker is being taken. Eye exam is not current.  Hypertension This is a chronic problem. The current episode started more than 1 year ago. Pertinent negatives include no headaches or palpitations. Past treatments  include ACE inhibitors. Hypertensive end-organ damage includes a thyroid problem.  Thyroid Problem Presents for initial visit. Symptoms include fatigue. Patient reports no cold intolerance, diarrhea, heat intolerance or palpitations. Past treatments include levothyroxine. The following procedures have not been performed: radioiodine uptake scan and thyroidectomy.      Review of Systems  Constitutional: Positive for fatigue. Negative for unexpected weight change.  HENT: Negative for trouble swallowing and voice change.   Eyes: Negative for visual disturbance.  Respiratory: Negative for cough and wheezing.   Cardiovascular: Negative for palpitations and leg swelling.  Gastrointestinal: Negative for nausea, vomiting and diarrhea.  Endocrine: Positive for polydipsia and polyuria. Negative for cold intolerance, heat intolerance and polyphagia.  Musculoskeletal: Negative for arthralgias.  Skin: Negative for color change, pallor, rash and wound.  Neurological: Negative for seizures and headaches.  Psychiatric/Behavioral: Negative for suicidal ideas and confusion.    Objective:    BP 119/77 mmHg  Pulse 103  Ht 5\' 5"  (1.651 m)  Wt 219 lb (99.338 kg)  BMI 36.44 kg/m2  SpO2 98%  Wt Readings from Last 3 Encounters:  03/17/15 219 lb (99.338 kg)  03/07/15 218 lb (98.884 kg)  06/23/14 200 lb (90.719 kg)    Physical Exam  Constitutional: She is oriented to person, place, and time. She appears well-developed.  HENT:  Head: Normocephalic and atraumatic.  Eyes: EOM are normal.  Neck: Normal range of motion. Neck supple. No tracheal deviation present. No thyromegaly present.  Cardiovascular: Normal rate and regular rhythm.   Pulmonary/Chest: Effort normal and breath sounds normal.  Abdominal: Soft. Bowel sounds are normal. There is no tenderness. There is no guarding.  Musculoskeletal: Normal range of motion. She exhibits no edema.  Neurological: She is alert and oriented to person, place,  and time. She has normal reflexes. No cranial nerve deficit. Coordination normal.  Skin: Skin is warm and dry. No rash noted. No erythema. No pallor.  Psychiatric: She has a normal mood and affect. Judgment normal.    Results for orders placed or performed in visit on 03/07/15  Hgb A1c w/o eAG  Result Value Ref Range   Hgb A1c MFr Bld 12.6 (H) 4.8 - 5.6 %  T4, free  Result Value Ref Range   Free T4 0.77 (L) 0.82 - 1.77 ng/dL  TSH  Result Value Ref Range   TSH 40.980 (H) 0.450 - 4.500 uIU/mL   Complete Blood Count (Most recent): Lab Results  Component Value Date   WBC 11.8* 06/23/2014   HGB 13.5 06/23/2014   HCT 40.8 06/23/2014   MCV 87.7 06/23/2014   PLT 284 06/23/2014   Chemistry (most recent): Lab Results  Component Value Date   NA 134* 06/23/2014   K 4.5 06/23/2014   CL 111 06/23/2014   CO2 19 06/23/2014   BUN 23 06/23/2014   CREATININE 1.02 06/23/2014   Diabetic Labs (most recent): Lab Results  Component Value Date   HGBA1C 12.6* 03/07/2015   Lipid profile (most recent): No results found for: TRIG, CHOL       Assessment & Plan:   1. 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 Recent labs reviewed.   - Her diabetes is complicated by coronary artery disease and patient remains at a high risk for more acute and chronic complications of diabetes which include CAD, CVA, CKD, retinopathy, and neuropathy. These are all discussed in detail with the patient.  - I have counseled the patient on diet management and weight loss, by adopting a carbohydrate restricted/protein rich diet.  - Suggestion is made for patient to avoid simple carbohydrates   from their diet including Cakes , Desserts, Ice Cream,  Soda (  diet and regular) , Sweet Tea , Candies,  Chips, Cookies, Artificial Sweeteners,   and "Sugar-free" Products .  This will help patient to have stable blood glucose profile and potentially avoid unintended weight gain.  - I encouraged the patient to switch to  unprocessed or minimally processed complex starch and increased protein intake (animal or plant source), fruits, and vegetables.  - Patient is advised to stick to a routine mealtimes to eat 3 meals  a day and avoid unnecessary snacks ( to snack only to correct hypoglycemia).  - The patient will be scheduled with Jearld Fenton, RDN, CDE for individualized DM education.  - I have approached patient with the following individualized plan to manage diabetes and patient agrees:   - I  will proceed to increase basal insulin Lantus to 32  units QAM, and prandial insulin Humalog 10 units TIDAC for pre-meal BG readings of 90-150mg /dl, plus patient specific correction dose for unexpected hyperglycemia above 150mg /dl, associated with strict monitoring of glucose  AC and HS. - Patient is warned not to take insulin without proper monitoring per orders. -Adjustment parameters are given for hypo and hyperglycemia in writing. -Patient is encouraged to call clinic for blood glucose levels less than 70 or above 300 mg /dl. - she is not a candidate for metformin,SGLT2 inhibitors, and incretin therapy.  -She will d/c her pump one hour after her first Humalog injection. - Patient specific target  A1c;  LDL, HDL, Triglycerides, and  Waist Circumference were discussed in detail.  2) BP/HTN:  Continue current medications including ACEI/ARB. 3) Lipids/HPL: Lipid levels unknown. I will request for fasting lipid panel.  4)  Weight/Diet: CDE Consult will be initiated , exercise, and detailed carbohydrates information provided. 5) Primary hypothyroidism - Her recent TSH was high at 40, but this is due to recent interruption in her LT4. -I advised her to continue levothyroxine 150 g by mouth every morning.  - We discussed about correct intake of levothyroxine, at fasting,  with water, separated by at least 30 minutes from breakfast, and separated by more than 4 hours from calcium, iron, multivitamins, acid reflux medications (PPIs). -Patient is made aware of the fact that thyroid hormone replacement is needed for life, dose to be adjusted by periodic monitoring of thyroid function tests.  6) Chronic Care/Health Maintenance:  -Patient is on ACEI and medications and encouraged to continue to follow up with Ophthalmology, Podiatrist at least yearly or according to recommendations, and advised to quit smoking. I have recommended yearly flu vaccine and pneumonia vaccination at least every 5 years; moderate intensity exercise for up to 150 minutes weekly; and  sleep for at least 7 hours a day.   Patient to bring meter and  blood glucose logs during their next visit.   40 minutes of time was spent on this patient's  care, 20 minutes of which has been spent on counseling.  I advised patient to maintain close follow up with their PCP for primary care needs. Follow up plan: Return in about 4 weeks (around 04/14/2015) for diabetes, high blood pressure, high cholesterol, underactive thyroid, follow up with meter and logs- no labs.  Glade Lloyd, MD Phone: [phone]  Fax: [phone]   03/17/2015, 4:37 PM

## 2015-04-10 ENCOUNTER — Ambulatory Visit (INDEPENDENT_AMBULATORY_CARE_PROVIDER_SITE_OTHER): Payer: Medicaid Other | Admitting: "Endocrinology

## 2015-04-10 ENCOUNTER — Encounter: Payer: Medicaid Other | Attending: "Endocrinology | Admitting: Nutrition

## 2015-04-10 ENCOUNTER — Encounter: Payer: Self-pay | Admitting: Nutrition

## 2015-04-10 ENCOUNTER — Encounter: Payer: Self-pay | Admitting: "Endocrinology

## 2015-04-10 VITALS — Ht 66.0 in | Wt 224.0 lb

## 2015-04-10 VITALS — BP 109/76 | HR 91 | Ht 66.0 in | Wt 224.0 lb

## 2015-04-10 DIAGNOSIS — Z794 Long term (current) use of insulin: Secondary | ICD-10-CM | POA: Diagnosis not present

## 2015-04-10 DIAGNOSIS — E109 Type 1 diabetes mellitus without complications: Secondary | ICD-10-CM | POA: Insufficient documentation

## 2015-04-10 DIAGNOSIS — IMO0002 Reserved for concepts with insufficient information to code with codable children: Secondary | ICD-10-CM

## 2015-04-10 DIAGNOSIS — I10 Essential (primary) hypertension: Secondary | ICD-10-CM | POA: Diagnosis not present

## 2015-04-10 DIAGNOSIS — F17200 Nicotine dependence, unspecified, uncomplicated: Secondary | ICD-10-CM | POA: Insufficient documentation

## 2015-04-10 DIAGNOSIS — E669 Obesity, unspecified: Secondary | ICD-10-CM

## 2015-04-10 DIAGNOSIS — E1065 Type 1 diabetes mellitus with hyperglycemia: Secondary | ICD-10-CM

## 2015-04-10 DIAGNOSIS — E1059 Type 1 diabetes mellitus with other circulatory complications: Secondary | ICD-10-CM

## 2015-04-10 DIAGNOSIS — E039 Hypothyroidism, unspecified: Secondary | ICD-10-CM | POA: Diagnosis not present

## 2015-04-10 DIAGNOSIS — E108 Type 1 diabetes mellitus with unspecified complications: Secondary | ICD-10-CM

## 2015-04-10 DIAGNOSIS — Z713 Dietary counseling and surveillance: Secondary | ICD-10-CM | POA: Insufficient documentation

## 2015-04-10 MED ORDER — LEVOTHYROXINE SODIUM 175 MCG PO TABS: 175.0000 ug | ORAL_TABLET | Freq: Every day | ORAL | Status: DC

## 2015-04-10 MED ORDER — INSULIN LISPRO 100 UNIT/ML (KWIKPEN): 12.0000 [IU] | PEN_INJECTOR | Freq: Three times a day (TID) | SUBCUTANEOUS | Status: DC

## 2015-04-10 MED ORDER — INSULIN GLARGINE 100 UNIT/ML SOLOSTAR PEN: 36.0000 [IU] | PEN_INJECTOR | Freq: Every morning | SUBCUTANEOUS | Status: DC

## 2015-04-10 MED ORDER — GLUCAGON (RDNA) 1 MG IJ KIT: 1.0000 mg | PACK | Freq: Once | INTRAMUSCULAR | Status: DC | PRN

## 2015-04-10 NOTE — Progress Notes (Signed)
Subjective:    Patient ID: Lauren [name], female    DOB: 1981-04-08,    Past Medical History  Diagnosis Date  . Diabetes mellitus without complication ([place])   . Renal disorder   . Myocardial infarction [facility])    Past Surgical History  Procedure Laterality Date  . Cholecystectomy    . Tubal ligation    . Cardiac catherization     Social History   Social History  . Marital Status: Single    Spouse Name: N/A  . Number of Children: N/A  . Years of Education: N/A   Social History Main Topics  . Smoking status: Current Every Day Smoker -- 1.00 packs/day    Types: Cigarettes  . Smokeless tobacco: None  . Alcohol Use: No  . Drug Use: No  . Sexual Activity: Not Asked   Other Topics Concern  . None   Social History Narrative   Outpatient Encounter Prescriptions as of 04/10/2015  Medication Sig  . furosemide (LASIX) 40 MG tablet Take 40 mg by mouth daily.  Marland Kitchen glucose blood (ACCU-CHEK AVIVA) test strip To test upto 6 times a day  . Insulin Glargine (LANTUS SOLOSTAR) 100 UNIT/ML Solostar Pen Inject 36 Units into the skin every morning.  . insulin lispro (HUMALOG KWIKPEN) 100 UNIT/ML KiwkPen Inject 0.12-0.18 mLs (12-18 Units total) into the skin 3 (three) times daily before meals.  . Insulin Pen Needle (B-D ULTRAFINE III SHORT PEN) 31G X 8 MM MISC 1 each by Does not apply route as directed.  Marland Kitchen levothyroxine (SYNTHROID, LEVOTHROID) 150 MCG tablet Take 150 mcg by mouth daily before breakfast.  . lisinopril (PRINIVIL,ZESTRIL) 2.5 MG tablet Take 2.5 mg by mouth daily.  . pantoprazole (PROTONIX) 40 MG tablet Take 40 mg by mouth 2 (two) times daily.  . Potassium Chloride Crys CR (KLOR-CON M20 PO) Take by mouth daily.  . Vitamin D, Ergocalciferol, (DRISDOL) 50000 UNITS CAPS capsule Take 50,000 Units by mouth every 7 (seven) days.  . [DISCONTINUED] Insulin Glargine (LANTUS SOLOSTAR) 100 UNIT/ML Solostar Pen Inject 32 Units into the skin every morning.  . [DISCONTINUED] insulin  lispro (HUMALOG KWIKPEN) 100 UNIT/ML KiwkPen Inject 0.1-0.16 mLs (10-16 Units total) into the skin 3 (three) times daily before meals.  Marland Kitchen glucagon (GLUCAGON EMERGENCY) 1 MG injection Inject 1 mg into the vein once as needed.  Marland Kitchen HYDROcodone-acetaminophen (NORCO/VICODIN) 5-325 MG per tablet Take 1-2 tablets by mouth every 4 (four) hours as needed. (Patient not taking: Reported on 04/10/2015)   No facility-administered encounter medications on file as of 04/10/2015.   ALLERGIES: Allergies  Allergen Reactions  . Ciprofloxacin Hcl   . Wellbutrin [Bupropion]    VACCINATION STATUS:  There is no immunization history on file for this patient.  Diabetes She presents for her initial diabetic visit. She has type 1 diabetes mellitus. Onset time: She was diagnosed at age 14 years. Her disease course has been improving. There are no hypoglycemic associated symptoms. Pertinent negatives for hypoglycemia include no confusion, headaches, pallor or seizures. Associated symptoms include fatigue, polydipsia and polyuria. Pertinent negatives for diabetes include no polyphagia. There are no hypoglycemic complications. Symptoms are improving. There are no diabetic complications. (She did have several attacks of diabetes ketoacidosis, last one 2 years ago.) Risk factors for coronary artery disease include family history, hypertension, sedentary lifestyle, obesity and tobacco exposure. Current diabetic treatment includes insulin pump. She is compliant with treatment most of the time. Her weight is fluctuating dramatically. She is following a generally unhealthy diet.  When asked about meal planning, she reported none. She has had a previous visit with a dietitian. She rarely participates in exercise. Her home blood glucose trend is decreasing steadily. Her breakfast blood glucose range is generally >200 mg/dl. Her lunch blood glucose range is generally >200 mg/dl. Her dinner blood glucose range is generally >200 mg/dl. Her  overall blood glucose range is >200 mg/dl. An ACE inhibitor/angiotensin II receptor blocker is being taken. Eye exam is not current.  Hypertension This is a chronic problem. The current episode started more than 1 year ago. Pertinent negatives include no headaches or palpitations. Past treatments include ACE inhibitors. Hypertensive end-organ damage includes a thyroid problem.  Thyroid Problem Presents for initial visit. Symptoms include fatigue. Patient reports no cold intolerance, diarrhea, heat intolerance or palpitations. Past treatments include levothyroxine. The following procedures have not been performed: radioiodine uptake scan and thyroidectomy.     Review of Systems  Constitutional: Positive for fatigue. Negative for unexpected weight change.  HENT: Negative for trouble swallowing and voice change.   Eyes: Negative for visual disturbance.  Respiratory: Negative for cough and wheezing.   Cardiovascular: Negative for palpitations and leg swelling.  Gastrointestinal: Negative for nausea, vomiting and diarrhea.  Endocrine: Positive for polydipsia and polyuria. Negative for cold intolerance, heat intolerance and polyphagia.  Musculoskeletal: Negative for arthralgias.  Skin: Negative for color change, pallor, rash and wound.  Neurological: Negative for seizures and headaches.  Psychiatric/Behavioral: Negative for suicidal ideas and confusion.    Objective:    BP 109/76 mmHg  Pulse 91  Ht 5\' 6"  (1.676 m)  Wt 224 lb (101.606 kg)  BMI 36.17 kg/m2  SpO2 96%  Wt Readings from Last 3 Encounters:  04/10/15 224 lb (101.606 kg)  04/10/15 224 lb (101.606 kg)  03/17/15 219 lb (99.338 kg)    Physical Exam  Constitutional: She is oriented to person, place, and time. She appears well-developed.  HENT:  Head: Normocephalic and atraumatic.  Eyes: EOM are normal.  Neck: Normal range of motion. Neck supple. No tracheal deviation present. No thyromegaly present.  Cardiovascular: Normal  rate and regular rhythm.   Pulmonary/Chest: Effort normal and breath sounds normal.  Abdominal: Soft. Bowel sounds are normal. There is no tenderness. There is no guarding.  Musculoskeletal: Normal range of motion. She exhibits no edema.  Neurological: She is alert and oriented to person, place, and time. She has normal reflexes. No cranial nerve deficit. Coordination normal.  Skin: Skin is warm and dry. No rash noted. No erythema. No pallor.  Psychiatric: She has a normal mood and affect. Judgment normal.    Results for orders placed or performed in visit on 03/07/15  Hgb A1c w/o eAG  Result Value Ref Range   Hgb A1c MFr Bld 12.6 (H) 4.8 - 5.6 %  T4, free  Result Value Ref Range   Free T4 0.77 (L) 0.82 - 1.77 ng/dL  TSH  Result Value Ref Range   TSH 40.980 (H) 0.450 - 4.500 uIU/mL   Complete Blood Count (Most recent): Lab Results  Component Value Date   WBC 11.8* 06/23/2014   HGB 13.5 06/23/2014   HCT 40.8 06/23/2014   MCV 87.7 06/23/2014   PLT 284 06/23/2014   Chemistry (most recent): Lab Results  Component Value Date   NA 134* 06/23/2014   K 4.5 06/23/2014   CL 111 06/23/2014   CO2 19 06/23/2014   BUN 23 06/23/2014   CREATININE 1.02 06/23/2014   Diabetic Labs (most recent): Lab Results  Component Value Date   HGBA1C 12.6* 03/07/2015     Assessment & Plan:   1. 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 Recent labs reviewed.   - Her diabetes is complicated by coronary artery disease and patient remains at a high risk for more acute and chronic complications of diabetes which include CAD, CVA, CKD, retinopathy, and neuropathy. These are all discussed in detail with the patient.  - I have counseled the patient on diet management and weight loss, by adopting a carbohydrate restricted/protein rich diet.  - Suggestion  is made for patient to avoid simple carbohydrates   from their diet including Cakes , Desserts, Ice Cream,  Soda (  diet and regular) , Sweet Tea , Candies,  Chips, Cookies, Artificial Sweeteners,   and "Sugar-free" Products . This will help patient to have stable blood glucose profile and potentially avoid unintended weight gain.  - I encouraged the patient to switch to  unprocessed or minimally processed complex starch and increased protein intake (animal or plant source), fruits, and vegetables.  - Patient is advised to stick to a routine mealtimes to eat 3 meals  a day and avoid unnecessary snacks ( to snack only to correct hypoglycemia).  - The patient will be scheduled with Jearld Fenton, RDN, CDE for individualized DM education.  - I have approached patient with the following individualized plan to manage diabetes and patient agrees:   - I  will proceed to increase basal insulin Lantus to 36  units QAM, and prandial insulin Humalog 12 units TIDAC for pre-meal BG readings of 90-150mg /dl, plus patient specific correction dose for unexpected hyperglycemia above 150mg /dl, associated with strict monitoring of glucose  AC and HS. - Patient is warned not to take insulin without proper monitoring per orders. -Adjustment parameters are given for hypo and hyperglycemia in writing. -Patient is encouraged to call clinic for blood glucose levels less than 70 or above 300 mg /dl. - she is not a candidate for metformin,SGLT2 inhibitors, and incretin therapy.  - Patient specific target  A1c;  LDL, HDL, Triglycerides, and  Waist Circumference were discussed in detail.  2) BP/HTN:  Continue current medications including ACEI/ARB. 3) Lipids/HPL: Lipid levels unknown. I will request for fasting lipid panel.  4)  Weight/Diet: CDE Consult will be initiated , exercise, and detailed carbohydrates information provided. 5) Primary hypothyroidism -Her thyroid function tests are consistent with underage  replacement, I would increase her levothyroxine to 175 g by mouth every morning.  - We discussed about correct intake of levothyroxine, at fasting, with water, separated by at least 30 minutes from breakfast, and separated by more than 4 hours from calcium, iron, multivitamins, acid reflux medications (PPIs). -Patient is made aware of the fact that thyroid hormone replacement is needed for life, dose to be adjusted by periodic monitoring of thyroid function tests.  6) Chronic Care/Health Maintenance:  -Patient is on ACEI and medications and encouraged to continue to follow up with Ophthalmology, Podiatrist at least yearly or according to recommendations, and advised to quit smoking. I have recommended yearly flu vaccine and pneumonia vaccination at least every 5 years; moderate intensity exercise for up to 150 minutes weekly; and  sleep for at least 7 hours a day.   Patient to bring meter and  blood glucose logs during their next visit.  40 minutes of time was spent on this patient's care, 20 minutes of which has been spent on counseling.  I advised patient to maintain close follow up with their PCP for primary care needs. Follow up plan: Return in about 10 weeks (around 06/19/2015) for diabetes, high blood pressure, underactive thyroid, follow up with pre-visit labs, meter, and logs.  Glade Lloyd, MD Phone: [phone]  Fax: [phone]   04/10/2015, 4:46 PM

## 2015-04-10 NOTE — Patient Instructions (Signed)
Goals 1. Follow MY Plate Method 2. Quit Smoking by 06/26/2015. 3. Will drink 2 bottles of water per day. 4. Will cut down drinking Dt. Dt Pepper by 2 bottles per day. 5. Increase fresh fruits and vegetables. 6. Walk 15 minutes three times per wek. 7. Get A1C down to 9% in three months.

## 2015-04-10 NOTE — Patient Instructions (Signed)

## 2015-04-10 NOTE — Progress Notes (Signed)
  Medical Nutrition Therapy:  Appt start time: 1000 end time:  1100.  Assessment:  Primary concerns today: Diabetes. Type 1. Most recent A1C 12.6%.   LIves with her Mom and she has 2 kids: 76 and 73.  She does her own cooking and shopping. Most foods are baked and grilling and little frying. Eats 2 meals and 1 snack. 36 units of Lantus and 12 units of Humalog. Sometimes skips meals and therefore doesn't take insulin. Plus sliding scale with meals.   FBS 200's and before lunch 200-300 and bedfore dinner 200's and bedtimes 100-200's Still smokes but willing to quit. Smokes a pack per day. Drinks only Diet. Dr. Malachi Bonds.  Given 1-800-QUIT NOW phone information.Interested in trying to improve blood sugars and changing diet and behaviors. Had a MI within the last year.  Diet is inconsisent in CHO and high in fat, sodium and calories. Weight gain related to excessive calories and poorly controlled DM. Use to be on an insulin pump but now is on MDI's due to poor BS control.   Lab Results  Component Value Date   HGBA1C 12.6* 03/07/2015    Preferred Learning Style:  No preference indicated   Learning Readiness:  Ready  Change in progress   MEDICATIONS: See list.   DIETARY INTAKE:     24-hr recall:  B ( AM): Eggs and toast 1 slice, bacon or sausage, Diet Dr. Malachi Bonds Snk ( AM): none  L ( PM): 2 eggs, 1-2 slices of bread, Dt. Dr Malachi Bonds Snk ( PM):  D ( PM): Spaghetti 1/2 c, bread -2 slices, Dt. Dr. Dorthula Nettles ( PM):  Beverages: Diet sodas,   Usual physical activity: ADL  Estimated energy needs: 1200-1500 calories 135 g carbohydrates 90 g protein 33 g fat  Progress Towards Goal(s):  In progress.   Nutritional Diagnosis:  NB-1.1 Food and nutrition-related knowledge deficit As related to DM.  As evidenced by A1C 12.6%..    Intervention: Nutrition and Diabetes education provided on My Plate, CHO counting, meal planning, portion sizes, timing of meals, avoiding snacks between meals unless  having a low blood sugar, target ranges for A1C and blood sugars, signs/symptoms and treatment of hyper/hypoglycemia, monitoring blood sugars, taking medications as prescribed, benefits of exercising 30 minutes per day and prevention of complications of DM. Marland Kitchen  Goals 1. Follow MY Plate Method 2. Quit Smoking by 06/26/2015. 3. Will drink 2 bottles of water per day. 4. Will cut down drinking Dt. Dt Pepper by 2 bottles per day. 5. Increase fresh fruits and vegetables. 6. Walk 15 minutes three times per wek. 7. Get A1C down to 9% in three months.  Teaching Method Utilized:  Visual Auditory Hands on  Handouts given during visit include:  The Plate Method  Meal Plan Card  Diabetes Instructions.  Barriers to learning/adherence to lifestyle change: None  Demonstrated degree of understanding via:  Teach Back   Monitoring/Evaluation:  Dietary intake, exercise, meal planning, SBG, and body weight in 1 month(s).

## 2015-04-14 ENCOUNTER — Ambulatory Visit: Payer: Medicaid Other | Admitting: "Endocrinology

## 2015-06-19 ENCOUNTER — Ambulatory Visit: Payer: Medicaid Other | Admitting: "Endocrinology

## 2015-06-19 ENCOUNTER — Ambulatory Visit: Payer: Medicaid Other | Admitting: Nutrition

## 2015-06-30 ENCOUNTER — Ambulatory Visit: Payer: Medicaid Other | Admitting: "Endocrinology

## 2015-07-04 ENCOUNTER — Ambulatory Visit: Payer: Medicaid Other | Admitting: Nutrition

## 2015-07-08 ENCOUNTER — Ambulatory Visit: Payer: Medicaid Other | Admitting: "Endocrinology

## 2015-07-09 ENCOUNTER — Other Ambulatory Visit: Payer: Self-pay

## 2015-07-09 MED ORDER — INSULIN GLARGINE 100 UNIT/ML SOLOSTAR PEN: 36.0000 [IU] | PEN_INJECTOR | Freq: Every morning | SUBCUTANEOUS | Status: DC

## 2015-07-09 MED ORDER — INSULIN LISPRO 100 UNIT/ML (KWIKPEN): 12.0000 [IU] | PEN_INJECTOR | Freq: Three times a day (TID) | SUBCUTANEOUS | Status: DC

## 2015-07-21 ENCOUNTER — Other Ambulatory Visit: Payer: Self-pay | Admitting: "Endocrinology

## 2015-07-22 LAB — T4, FREE: FREE T4: 0.73 ng/dL — AB (ref 0.82–1.77)

## 2015-07-22 LAB — BASIC METABOLIC PANEL
BUN / CREAT RATIO: 17 (ref 8–20)
BUN: 20 mg/dL (ref 6–20)
CO2: 19 mmol/L (ref 18–29)
CREATININE: 1.16 mg/dL — AB (ref 0.57–1.00)
Calcium: 8.5 mg/dL — ABNORMAL LOW (ref 8.7–10.2)
Chloride: 100 mmol/L (ref 96–106)
GFR calc Af Amer: 71 mL/min/{1.73_m2} (ref >59)
GFR, EST NON AFRICAN AMERICAN: 62 mL/min/{1.73_m2} (ref >59)
GLUCOSE: 128 mg/dL — AB (ref 65–99)
Potassium: 4.4 mmol/L (ref 3.5–5.2)
SODIUM: 138 mmol/L (ref 134–144)

## 2015-07-22 LAB — TSH: TSH: 33.13 u[IU]/mL — ABNORMAL HIGH (ref 0.450–4.500)

## 2015-07-22 LAB — HGB A1C W/O EAG: HEMOGLOBIN A1C: 10.9 % — AB (ref 4.8–5.6)

## 2015-07-24 ENCOUNTER — Encounter: Payer: Self-pay | Admitting: "Endocrinology

## 2015-07-24 ENCOUNTER — Ambulatory Visit (INDEPENDENT_AMBULATORY_CARE_PROVIDER_SITE_OTHER): Payer: Medicaid Other | Admitting: "Endocrinology

## 2015-07-24 VITALS — BP 116/80 | HR 90 | Resp 18 | Wt 232.1 lb

## 2015-07-24 DIAGNOSIS — I10 Essential (primary) hypertension: Secondary | ICD-10-CM

## 2015-07-24 DIAGNOSIS — E039 Hypothyroidism, unspecified: Secondary | ICD-10-CM

## 2015-07-24 DIAGNOSIS — E559 Vitamin D deficiency, unspecified: Secondary | ICD-10-CM

## 2015-07-24 DIAGNOSIS — E1059 Type 1 diabetes mellitus with other circulatory complications: Secondary | ICD-10-CM | POA: Diagnosis not present

## 2015-07-24 MED ORDER — LEVOTHYROXINE SODIUM 200 MCG PO TABS: 200.0000 ug | ORAL_TABLET | Freq: Every day | ORAL | Status: DC

## 2015-07-24 NOTE — Patient Instructions (Signed)

## 2015-07-24 NOTE — Progress Notes (Signed)
Subjective:    Patient ID: Lauren [name], female    DOB: 08-10-1980,    Past Medical History  Diagnosis Date  . Diabetes mellitus without complication ([place])   . Renal disorder   . Myocardial infarction [facility])    Past Surgical History  Procedure Laterality Date  . Cholecystectomy    . Tubal ligation    . Cardiac catherization     Social History   Social History  . Marital Status: Single    Spouse Name: N/A  . Number of Children: N/A  . Years of Education: N/A   Social History Main Topics  . Smoking status: Current Every Day Smoker -- 1.00 packs/day    Types: Cigarettes  . Smokeless tobacco: None  . Alcohol Use: No  . Drug Use: No  . Sexual Activity: Not Asked   Other Topics Concern  . None   Social History Narrative   Outpatient Encounter Prescriptions as of 07/24/2015  Medication Sig  . furosemide (LASIX) 40 MG tablet Take 40 mg by mouth daily.  Marland Kitchen glucagon (GLUCAGON EMERGENCY) 1 MG injection Inject 1 mg into the vein once as needed.  Marland Kitchen glucose blood (ACCU-CHEK AVIVA) test strip To test upto 6 times a day  . Insulin Glargine (LANTUS) 100 UNIT/ML Solostar Pen Inject 40 Units into the skin every morning.  . insulin lispro (HUMALOG KWIKPEN) 100 UNIT/ML KiwkPen Inject 0.12-0.18 mLs (12-18 Units total) into the skin 3 (three) times daily before meals.  . Insulin Pen Needle (B-D ULTRAFINE III SHORT PEN) 31G X 8 MM MISC 1 each by Does not apply route as directed.  Marland Kitchen lisinopril (PRINIVIL,ZESTRIL) 2.5 MG tablet Take 2.5 mg by mouth daily.  . pantoprazole (PROTONIX) 40 MG tablet Take 40 mg by mouth 2 (two) times daily.  . Potassium Chloride Crys CR (KLOR-CON M20 PO) Take by mouth daily.  . Vitamin D, Ergocalciferol, (DRISDOL) 50000 UNITS CAPS capsule Take 50,000 Units by mouth every 7 (seven) days.  . [DISCONTINUED] HYDROcodone-acetaminophen (NORCO/VICODIN) 5-325 MG per tablet Take 1-2 tablets by mouth every 4 (four) hours as needed.  . [DISCONTINUED] Insulin Glargine  (LANTUS SOLOSTAR) 100 UNIT/ML Solostar Pen Inject 36 Units into the skin every morning.  . [DISCONTINUED] levothyroxine (SYNTHROID, LEVOTHROID) 175 MCG tablet Take 1 tablet (175 mcg total) by mouth daily before breakfast.  . levothyroxine (SYNTHROID, LEVOTHROID) 200 MCG tablet Take 1 tablet (200 mcg total) by mouth daily.   No facility-administered encounter medications on file as of 07/24/2015.   ALLERGIES: Allergies  Allergen Reactions  . Ciprofloxacin Hcl   . Wellbutrin [Bupropion]    VACCINATION STATUS:  There is no immunization history on file for this patient.  Diabetes She presents for her initial diabetic visit. She has type 1 diabetes mellitus. Onset time: She was diagnosed at age 3 years. Her disease course has been improving. There are no hypoglycemic associated symptoms. Pertinent negatives for hypoglycemia include no confusion, headaches, pallor or seizures. Associated symptoms include fatigue. Pertinent negatives for diabetes include no polydipsia, no polyphagia and no polyuria. There are no hypoglycemic complications. Symptoms are improving. There are no diabetic complications. (She did have several attacks of diabetes ketoacidosis, last one 2 years ago.) Risk factors for coronary artery disease include family history, hypertension, sedentary lifestyle, obesity and tobacco exposure. Current diabetic treatment includes insulin pump. She is compliant with treatment most of the time. Her weight is fluctuating dramatically. She is following a generally unhealthy diet. When asked about meal planning, she reported none.  She has had a previous visit with a dietitian. She rarely participates in exercise. Her home blood glucose trend is decreasing steadily. Her breakfast blood glucose range is generally >200 mg/dl. Her lunch blood glucose range is generally >200 mg/dl. Her dinner blood glucose range is generally >200 mg/dl. Her overall blood glucose range is >200 mg/dl. An ACE  inhibitor/angiotensin II receptor blocker is being taken. Eye exam is not current.  Hypertension This is a chronic problem. The current episode started more than 1 year ago. Pertinent negatives include no headaches or palpitations. Past treatments include ACE inhibitors. Hypertensive end-organ damage includes a thyroid problem.  Thyroid Problem Presents for initial visit. Symptoms include fatigue. Patient reports no cold intolerance, diarrhea, heat intolerance or palpitations. Past treatments include levothyroxine. The following procedures have not been performed: radioiodine uptake scan and thyroidectomy.     Review of Systems  Constitutional: Positive for fatigue. Negative for unexpected weight change.  HENT: Negative for trouble swallowing and voice change.   Eyes: Negative for visual disturbance.  Respiratory: Negative for cough and wheezing.   Cardiovascular: Negative for palpitations and leg swelling.  Gastrointestinal: Negative for nausea, vomiting and diarrhea.  Endocrine: Negative for cold intolerance, heat intolerance, polydipsia, polyphagia and polyuria.  Musculoskeletal: Negative for arthralgias.  Skin: Negative for color change, pallor, rash and wound.  Neurological: Negative for seizures and headaches.  Psychiatric/Behavioral: Negative for suicidal ideas and confusion.    Objective:    BP 116/80 mmHg  Pulse 90  Resp 18  Wt 232 lb 1.3 oz (105.271 kg)  SpO2 99%  Wt Readings from Last 3 Encounters:  07/24/15 232 lb 1.3 oz (105.271 kg)  04/10/15 224 lb (101.606 kg)  04/10/15 224 lb (101.606 kg)    Physical Exam  Constitutional: She is oriented to person, place, and time. She appears well-developed.  HENT:  Head: Normocephalic and atraumatic.  Eyes: EOM are normal.  Neck: Normal range of motion. Neck supple. No tracheal deviation present. No thyromegaly present.  Cardiovascular: Normal rate and regular rhythm.   Pulmonary/Chest: Effort normal and breath sounds  normal.  Abdominal: Soft. Bowel sounds are normal. There is no tenderness. There is no guarding.  Musculoskeletal: Normal range of motion. She exhibits no edema.  Neurological: She is alert and oriented to person, place, and time. She has normal reflexes. No cranial nerve deficit. Coordination normal.  Skin: Skin is warm and dry. No rash noted. No erythema. No pallor.  Psychiatric: She has a normal mood and affect. Judgment normal.    Results for orders placed or performed in visit on [id number]  Basic metabolic panel  Result Value Ref Range   Glucose 128 (H) 65 - 99 mg/dL   BUN 20 6 - 20 mg/dL   Creatinine, Ser 1.16 (H) 0.57 - 1.00 mg/dL   GFR calc non Af Amer 62 >59 mL/min/1.73   GFR calc Af Amer 71 >59 mL/min/1.73   BUN/Creatinine Ratio 17 8 - 20   Sodium 138 134 - 144 mmol/L   Potassium 4.4 3.5 - 5.2 mmol/L   Chloride 100 96 - 106 mmol/L   CO2 19 18 - 29 mmol/L   Calcium 8.5 (L) 8.7 - 10.2 mg/dL  Hgb A1c w/o eAG  Result Value Ref Range   Hgb A1c MFr Bld 10.9 (H) 4.8 - 5.6 %  T4, free  Result Value Ref Range   Free T4 0.73 (L) 0.82 - 1.77 ng/dL  TSH  Result Value Ref Range   TSH 33.130 (H) 0.450 -  4.500 uIU/mL  Diabetic Labs (most recent): Lab Results  Component Value Date   HGBA1C 10.9* 07/21/2015   HGBA1C 12.6* 03/07/2015     Assessment & Plan:   1. 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 Recent labs reviewed.   - Her diabetes is complicated by coronary artery disease and patient remains at a high risk for more acute and chronic complications of diabetes which include CAD, CVA, CKD, retinopathy, and neuropathy. These are all discussed in detail with the patient.  - I have counseled the patient on diet management and weight loss, by adopting a carbohydrate restricted/protein rich diet.  - Suggestion is made  for patient to avoid simple carbohydrates   from their diet including Cakes , Desserts, Ice Cream,  Soda (  diet and regular) , Sweet Tea , Candies,  Chips, Cookies, Artificial Sweeteners,   and "Sugar-free" Products . This will help patient to have stable blood glucose profile and potentially avoid unintended weight gain.  - I encouraged the patient to switch to  unprocessed or minimally processed complex starch and increased protein intake (animal or plant source), fruits, and vegetables.  - Patient is advised to stick to a routine mealtimes to eat 3 meals  a day and avoid unnecessary snacks ( to snack only to correct hypoglycemia).  - The patient will be scheduled with Lauren [name], RDN, CDE for individualized DM education.  - I have approached patient with the following individualized plan to manage diabetes and patient agrees:   - I  will proceed to increase basal insulin Lantus to 40  units QAM, and continue  prandial insulin Humalog 12 units TIDAC for pre-meal BG readings of 90-150mg /dl, plus patient specific correction dose for unexpected hyperglycemia above 150mg /dl, associated with strict monitoring of glucose  AC and HS. - Patient is warned not to take insulin without proper monitoring per orders. -Adjustment parameters are given for hypo and hyperglycemia in writing. -Patient is encouraged to call clinic for blood glucose levels less than 70 or above 300 mg /dl. - she is not a candidate for metformin,SGLT2 inhibitors, and incretin therapy. -I give her brochures and information on DEXCOM. - Patient specific target  A1c;  LDL, HDL, Triglycerides, and  Waist Circumference were discussed in detail.  2) BP/HTN:  Continue current medications including ACEI/ARB. 3) Lipids/HPL: Lipid levels unknown. I will request for fasting lipid panel.  4)  Weight/Diet: CDE Consult will be initiated , exercise, and detailed carbohydrates information provided. 5) Primary hypothyroidism -Her thyroid  function tests are  Still consistent with underage replacement. She reports compliance to his medication. I will increase her levothyroxine to 200 g by mouth every morning.  - We discussed about correct intake of levothyroxine, at fasting, with water, separated by at least 30 minutes from breakfast, and separated by more than 4 hours from calcium, iron, multivitamins, acid reflux medications (PPIs). -Patient is made aware of the fact that thyroid hormone replacement is needed for life, dose to be adjusted by periodic monitoring of thyroid function tests.  6) Chronic Care/Health Maintenance:  -Patient is on ACEI and medications and encouraged to continue to follow up with Ophthalmology, Podiatrist at least yearly or according to recommendations, and advised to quit smoking. I have recommended yearly flu vaccine and pneumonia vaccination at least every 5 years; moderate intensity exercise  for up to 150 minutes weekly; and  sleep for at least 7 hours a day.   Patient to bring meter and  blood glucose logs during their next visit.   40 minutes of time was spent on this patient's care, 20 minutes of which has been spent on counseling.  I advised patient to maintain close follow up with their PCP for primary care needs. Follow up plan: Return in about 3 months (around 10/24/2015) for diabetes, high blood pressure, underactive thyroid, follow up with pre-visit labs, meter, and logs.  Glade Lloyd, MD Phone: [phone]  Fax: [phone]   07/24/2015, 4:49 PM

## 2015-10-29 ENCOUNTER — Ambulatory Visit: Payer: Medicaid Other | Admitting: "Endocrinology

## 2015-11-18 LAB — HEMOGLOBIN A1C
ESTIMATED AVERAGE GLUCOSE: 298 mg/dL
HEMOGLOBIN A1C: 12 % — AB (ref 4.8–5.6)

## 2015-11-18 LAB — BASIC METABOLIC PANEL
BUN / CREAT RATIO: 22 (ref 9–23)
BUN: 29 mg/dL — ABNORMAL HIGH (ref 6–20)
CHLORIDE: 94 mmol/L — AB (ref 96–106)
CO2: 19 mmol/L (ref 18–29)
CREATININE: 1.31 mg/dL — AB (ref 0.57–1.00)
Calcium: 9.3 mg/dL (ref 8.7–10.2)
GFR calc Af Amer: 62 mL/min/{1.73_m2} (ref >59)
GFR calc non Af Amer: 54 mL/min/{1.73_m2} — ABNORMAL LOW (ref >59)
GLUCOSE: 368 mg/dL — AB (ref 65–99)
POTASSIUM: 4.8 mmol/L (ref 3.5–5.2)
SODIUM: 135 mmol/L (ref 134–144)

## 2015-11-18 LAB — TSH: TSH: 5.86 u[IU]/mL — ABNORMAL HIGH (ref 0.450–4.500)

## 2015-11-18 LAB — T4, FREE: Free T4: 1.52 ng/dL (ref 0.82–1.77)

## 2015-12-22 ENCOUNTER — Other Ambulatory Visit: Payer: Self-pay | Admitting: "Endocrinology

## 2015-12-23 ENCOUNTER — Encounter (HOSPITAL_COMMUNITY): Payer: Self-pay

## 2015-12-23 ENCOUNTER — Emergency Department (HOSPITAL_COMMUNITY)
Admission: EM | Admit: 2015-12-23 | Discharge: 2015-12-23 | Disposition: A | Discharge status: Home / Self Care | Payer: Medicaid Other | Attending: Emergency Medicine | Admitting: Emergency Medicine

## 2015-12-23 ENCOUNTER — Emergency Department (HOSPITAL_COMMUNITY): Payer: Medicaid Other

## 2015-12-23 DIAGNOSIS — Z794 Long term (current) use of insulin: Secondary | ICD-10-CM | POA: Insufficient documentation

## 2015-12-23 DIAGNOSIS — W2203XA Walked into furniture, initial encounter: Secondary | ICD-10-CM | POA: Insufficient documentation

## 2015-12-23 DIAGNOSIS — S92515A Nondisplaced fracture of proximal phalanx of left lesser toe(s), initial encounter for closed fracture: Secondary | ICD-10-CM | POA: Insufficient documentation

## 2015-12-23 DIAGNOSIS — Y929 Unspecified place or not applicable: Secondary | ICD-10-CM | POA: Insufficient documentation

## 2015-12-23 DIAGNOSIS — Y939 Activity, unspecified: Secondary | ICD-10-CM | POA: Insufficient documentation

## 2015-12-23 DIAGNOSIS — Y999 Unspecified external cause status: Secondary | ICD-10-CM | POA: Insufficient documentation

## 2015-12-23 DIAGNOSIS — S92911A Unspecified fracture of right toe(s), initial encounter for closed fracture: Secondary | ICD-10-CM

## 2015-12-23 DIAGNOSIS — Z79899 Other long term (current) drug therapy: Secondary | ICD-10-CM | POA: Insufficient documentation

## 2015-12-23 DIAGNOSIS — Z87891 Personal history of nicotine dependence: Secondary | ICD-10-CM | POA: Insufficient documentation

## 2015-12-23 DIAGNOSIS — E119 Type 2 diabetes mellitus without complications: Secondary | ICD-10-CM | POA: Insufficient documentation

## 2015-12-23 DIAGNOSIS — I10 Essential (primary) hypertension: Secondary | ICD-10-CM | POA: Insufficient documentation

## 2015-12-23 IMAGING — DX DG TOE 5TH 2+V*R*
4 series · 4 of 4 positions shown · non-contrast
Comparison: None.

CLINICAL DATA: 35 y/o F; stubbed her toe with pain at the right
fifth metatarsophalangeal joint.

EXAM:
RIGHT FIFTH TOE

[toe ap]
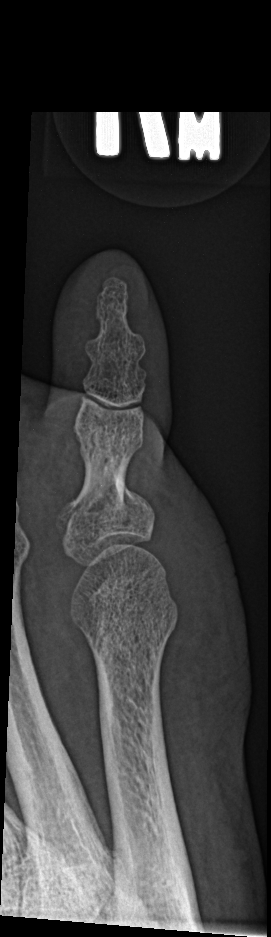

[toe obl (1 of 2)]
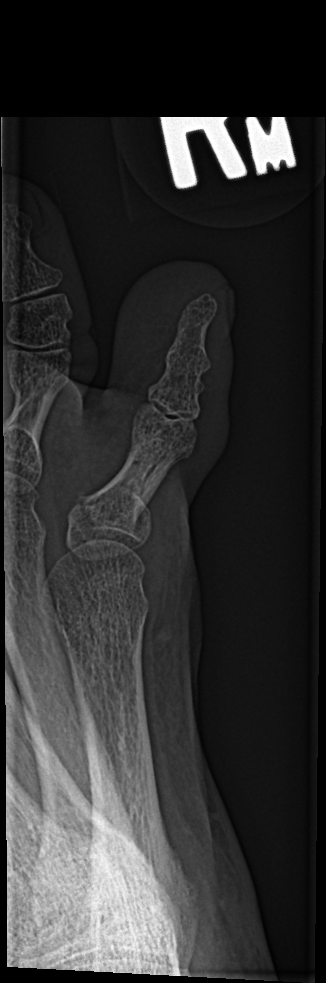

[toe lat]
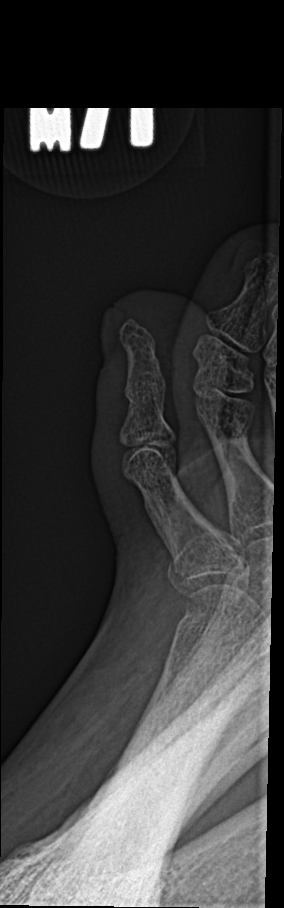

[toe obl (2 of 2)]
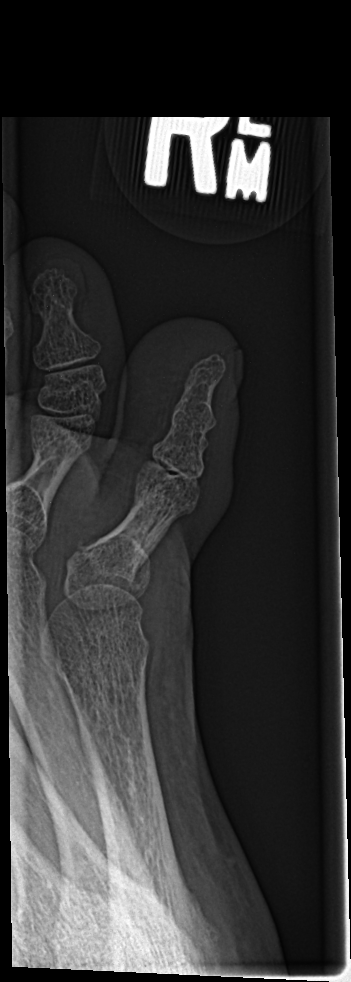

[4 of 4 positions shown; findings below may reference images not displayed]

FINDINGS: Nondisplaced mildly impacted and medially angulated fracture through
the base of the fifth proximal phalanx with intra-articular
extension. Soft tissue swelling about the joint. No other fracture
is identified.
IMPRESSION: Nondisplaced mildly impacted and medially angulated fracture through
the base of the fifth proximal phalanx with intra-articular
extension.

By: AKI-PETTERI M.D.

## 2015-12-23 NOTE — ED Triage Notes (Signed)
Tripped over my son and hit a coffee table.  The right little toe hurts.

## 2015-12-24 NOTE — ED Provider Notes (Signed)
[facility] DEPT Provider Note   CSN: [id number] Arrival date & time: 12/23/15  2120     History   Chief Complaint Chief Complaint  Patient presents with  . Toe Injury    HPI Lauren [name] is a 35 y.o. female with pain, bruising and swelling of her right 5th toe with injury occurring just prior to arrival when she accidentally stubbed the foot on a coffee table. She has had no treatment prior to arrival for this injury.  She does endorse having decreased sensation at the very tip of the toe.  She has no other injuries and there is no radiation of pain.   The history is provided by the patient and the spouse.    Past Medical History:  Diagnosis Date  . Diabetes mellitus without complication ([place])   . Myocardial infarction ([place])   . Renal disorder     Patient Active Problem List   Diagnosis Date Noted  . Vitamin D deficiency 07/24/2015  . Essential hypertension, benign 03/17/2015  . 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 Primary hypothyroidism 03/07/2015    Past Surgical History:  Procedure Laterality Date  . Cardiac catherization    . CHOLECYSTECTOMY    . TUBAL LIGATION      OB History    No data available       Home Medications    Prior to Admission medications   Medication Sig Start Date End Date Taking? Authorizing Provider  CHANTIX CONTINUING MONTH PAK 1 MG tablet Take 1 tablet by mouth 2 (two) times daily. 12/17/15  Yes Historical Provider, MD  furosemide (LASIX) 40 MG tablet Take 40 mg by mouth daily.   Yes Historical Provider, MD  glucagon (GLUCAGON EMERGENCY) 1 MG injection Inject 1 mg into the vein once as needed. 04/10/15  Yes Cassandria Anger, MD  glucose blood (ACCU-CHEK AVIVA) test strip To test upto 6 times a day 03/07/15  Yes Cassandria Anger, MD  insulin aspart (NOVOLOG FLEXPEN) 100 UNIT/ML FlexPen Inject 10-18 Units into the skin 3 (three) times daily with meals. Sliding scale   Yes Historical Provider,  MD  LANTUS SOLOSTAR 100 UNIT/ML Solostar Pen INJECT 36 UNITS SUBCUTANEOUSLY EVERY MORNING. Patient taking differently: INJECT 40 UNITS SUBCUTANEOUSLY EVERY MORNING. 12/22/15  Yes Cassandria Anger, MD  levothyroxine (SYNTHROID, LEVOTHROID) 175 MCG tablet Take 175 mcg by mouth daily before breakfast.   Yes Historical Provider, MD  pantoprazole (PROTONIX) 40 MG tablet Take 40 mg by mouth 2 (two) times daily.   Yes Historical Provider, MD  Potassium Chloride Crys CR (KLOR-CON M20 PO) Take by mouth daily.   Yes Historical Provider, MD  potassium chloride SA (K-DUR,KLOR-CON) 20 MEQ tablet Take 20 mEq by mouth daily.   Yes Historical Provider, MD  Vitamin D, Ergocalciferol, (DRISDOL) 50000 UNITS CAPS capsule Take 50,000 Units by mouth every 7 (seven) days.   Yes Historical Provider, MD  Insulin Pen Needle (B-D ULTRAFINE III SHORT PEN) 31G X 8 MM MISC 1 each by Does not apply route as directed. 03/07/15   Cassandria Anger, MD    Family History Family History  Problem Relation Age of Onset  . Thyroid disease Mother   . Hypertension Mother   . Hyperlipidemia Mother   . Hyperlipidemia Father   . Hypertension Father     Social History Social History  Substance Use Topics  . Smoking status: Former Smoker    Packs/day: 1.00    Types: Cigarettes  .  Smokeless tobacco: Never Used  . Alcohol use No     Allergies   Wellbutrin [bupropion]; Ciprofloxacin hcl; and Tape   Review of Systems Review of Systems  Constitutional: Negative for fever.  Musculoskeletal: Positive for arthralgias and joint swelling. Negative for myalgias.  Skin: Positive for color change. Negative for wound.  Neurological: Negative for weakness and numbness.     Physical Exam Updated Vital Signs BP 143/87   Pulse 88   Temp 98.4 F (36.9 C) (Oral)   Resp 20   Ht 5' 5.5" (1.664 m)   Wt 108.9 kg   LMP 12/18/2015 (Exact Date)   SpO2 100%   BMI 39.33 kg/m   Physical Exam  Constitutional: She appears  well-developed and well-nourished.  HENT:  Head: Atraumatic.  Neck: Normal range of motion.  Cardiovascular:  Pulses equal bilaterally  Musculoskeletal: She exhibits tenderness.       Feet:  ttp , edema and early bruising of right 5th toe, decreased sensation distal tip, lateral, dorsal and plantar surfaces sensation norma.  There is a mild lateral angulation of the digit.  Less than 3 sec cap refill.  Skin intact. Dorsalis pedal pulse full.  Neurological: She is alert. She has normal strength. She displays normal reflexes. No sensory deficit.  Skin: Skin is warm and dry.  Psychiatric: She has a normal mood and affect.     ED Treatments / Results  Labs (all labs ordered are listed, but only abnormal results are displayed) Labs Reviewed - No data to display  EKG  EKG Interpretation None       Radiology Dg Toe 5th Right  Result Date: 12/23/2015 CLINICAL DATA:  35 y/o F; stubbed her toe with pain at the right fifth metatarsophalangeal joint. EXAM: RIGHT FIFTH TOE COMPARISON:  None. FINDINGS: Nondisplaced mildly impacted and medially angulated fracture through the base of the fifth proximal phalanx with intra-articular extension. Soft tissue swelling about the joint. No other fracture is identified. IMPRESSION: Nondisplaced mildly impacted and medially angulated fracture through the base of the fifth proximal phalanx with intra-articular extension. Electronically Signed   By: Kristine Garbe M.D.   On: 12/23/2015 22:35    Procedures Procedures (including critical care time)  Medications Ordered in ED Medications - No data to display   Initial Impression / Assessment and Plan / ED Course  I have reviewed the triage vital signs and the nursing notes.  Pertinent labs & imaging results that were available during my care of the patient were reviewed by me and considered in my medical decision making (see chart for details).  Clinical Course    Buddy tape, post op  shoe. Ice, elevation, motrin or tylenol prn for pain. Referral to ortho for f/u visit within 1 week. Pt understands and agrees with plan.  Discussed the injury does involve the proximal joint space and will require specialty ortho care to ensure this heals properly aligned.   Splint was examined post application, pain improved,  , less than 3 sec cap refill.    Final Clinical Impressions(s) / ED Diagnoses   Final diagnoses:  Phalanx fracture, foot, right, closed, initial encounter    New Prescriptions Discharge Medication List as of 12/23/2015 11:04 PM       Evalee Jefferson, PA-C 12/24/15 1345    Evalee Jefferson, PA-C 12/24/15 [place], PA-C 12/24/15 [place], MD 12/24/15 [phone]

## 2016-02-14 ENCOUNTER — Other Ambulatory Visit: Payer: Self-pay | Admitting: "Endocrinology

## 2016-03-10 ENCOUNTER — Telehealth: Payer: Self-pay | Admitting: Nutrition

## 2016-03-10 NOTE — Telephone Encounter (Signed)
Contacted pt to see if she was interested in follow up appointment. She notes she didn't need any further follow up and she notes her blood sugars are doing better. She reports seeing Dr. Karie Kirks for her PCP.

## 2016-04-09 ENCOUNTER — Other Ambulatory Visit: Payer: Self-pay | Admitting: "Endocrinology

## 2016-09-09 ENCOUNTER — Emergency Department (HOSPITAL_COMMUNITY)
Admission: EM | Admit: 2016-09-09 | Discharge: 2016-09-09 | Disposition: A | Discharge status: Home / Self Care | Payer: BLUE CROSS/BLUE SHIELD | Attending: Emergency Medicine | Admitting: Emergency Medicine

## 2016-09-09 ENCOUNTER — Emergency Department (HOSPITAL_COMMUNITY): Payer: BLUE CROSS/BLUE SHIELD

## 2016-09-09 ENCOUNTER — Encounter (HOSPITAL_COMMUNITY): Payer: Self-pay | Admitting: Emergency Medicine

## 2016-09-09 DIAGNOSIS — J181 Lobar pneumonia, unspecified organism: Secondary | ICD-10-CM | POA: Insufficient documentation

## 2016-09-09 DIAGNOSIS — Z720 Tobacco use: Secondary | ICD-10-CM

## 2016-09-09 DIAGNOSIS — J189 Pneumonia, unspecified organism: Secondary | ICD-10-CM

## 2016-09-09 DIAGNOSIS — E1065 Type 1 diabetes mellitus with hyperglycemia: Secondary | ICD-10-CM

## 2016-09-09 DIAGNOSIS — E860 Dehydration: Secondary | ICD-10-CM | POA: Diagnosis not present

## 2016-09-09 DIAGNOSIS — I252 Old myocardial infarction: Secondary | ICD-10-CM | POA: Insufficient documentation

## 2016-09-09 DIAGNOSIS — E109 Type 1 diabetes mellitus without complications: Secondary | ICD-10-CM | POA: Diagnosis not present

## 2016-09-09 DIAGNOSIS — J4521 Mild intermittent asthma with (acute) exacerbation: Secondary | ICD-10-CM

## 2016-09-09 DIAGNOSIS — R05 Cough: Secondary | ICD-10-CM | POA: Diagnosis present

## 2016-09-09 DIAGNOSIS — Z79899 Other long term (current) drug therapy: Secondary | ICD-10-CM | POA: Insufficient documentation

## 2016-09-09 DIAGNOSIS — Z87891 Personal history of nicotine dependence: Secondary | ICD-10-CM | POA: Diagnosis not present

## 2016-09-09 LAB — CBG MONITORING, ED: GLUCOSE-CAPILLARY: 348 mg/dL — AB (ref 65–99)

## 2016-09-09 IMAGING — DX DG CHEST 2V
2 series · 2 of 2 positions shown · non-contrast
Comparison: [DATE]

CLINICAL DATA: Cough, posterior chest pain

EXAM:
CHEST  2 VIEW

[chest pa]
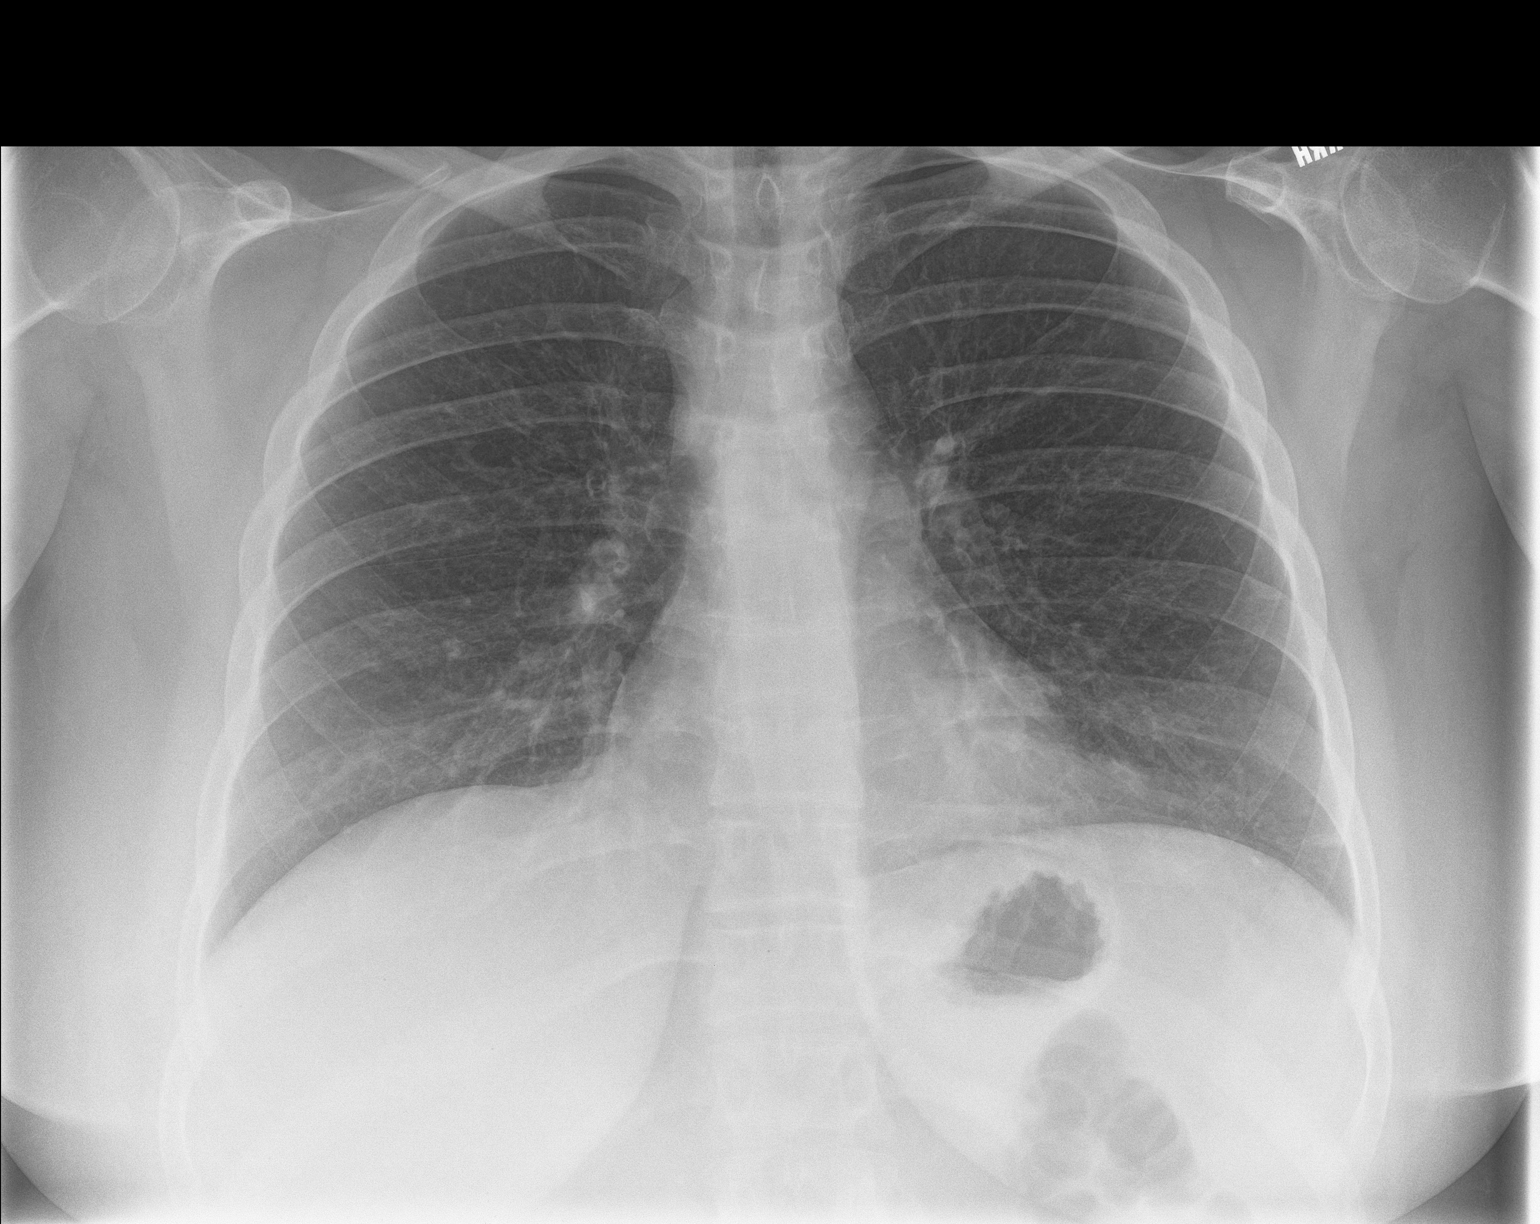

[chest lat]
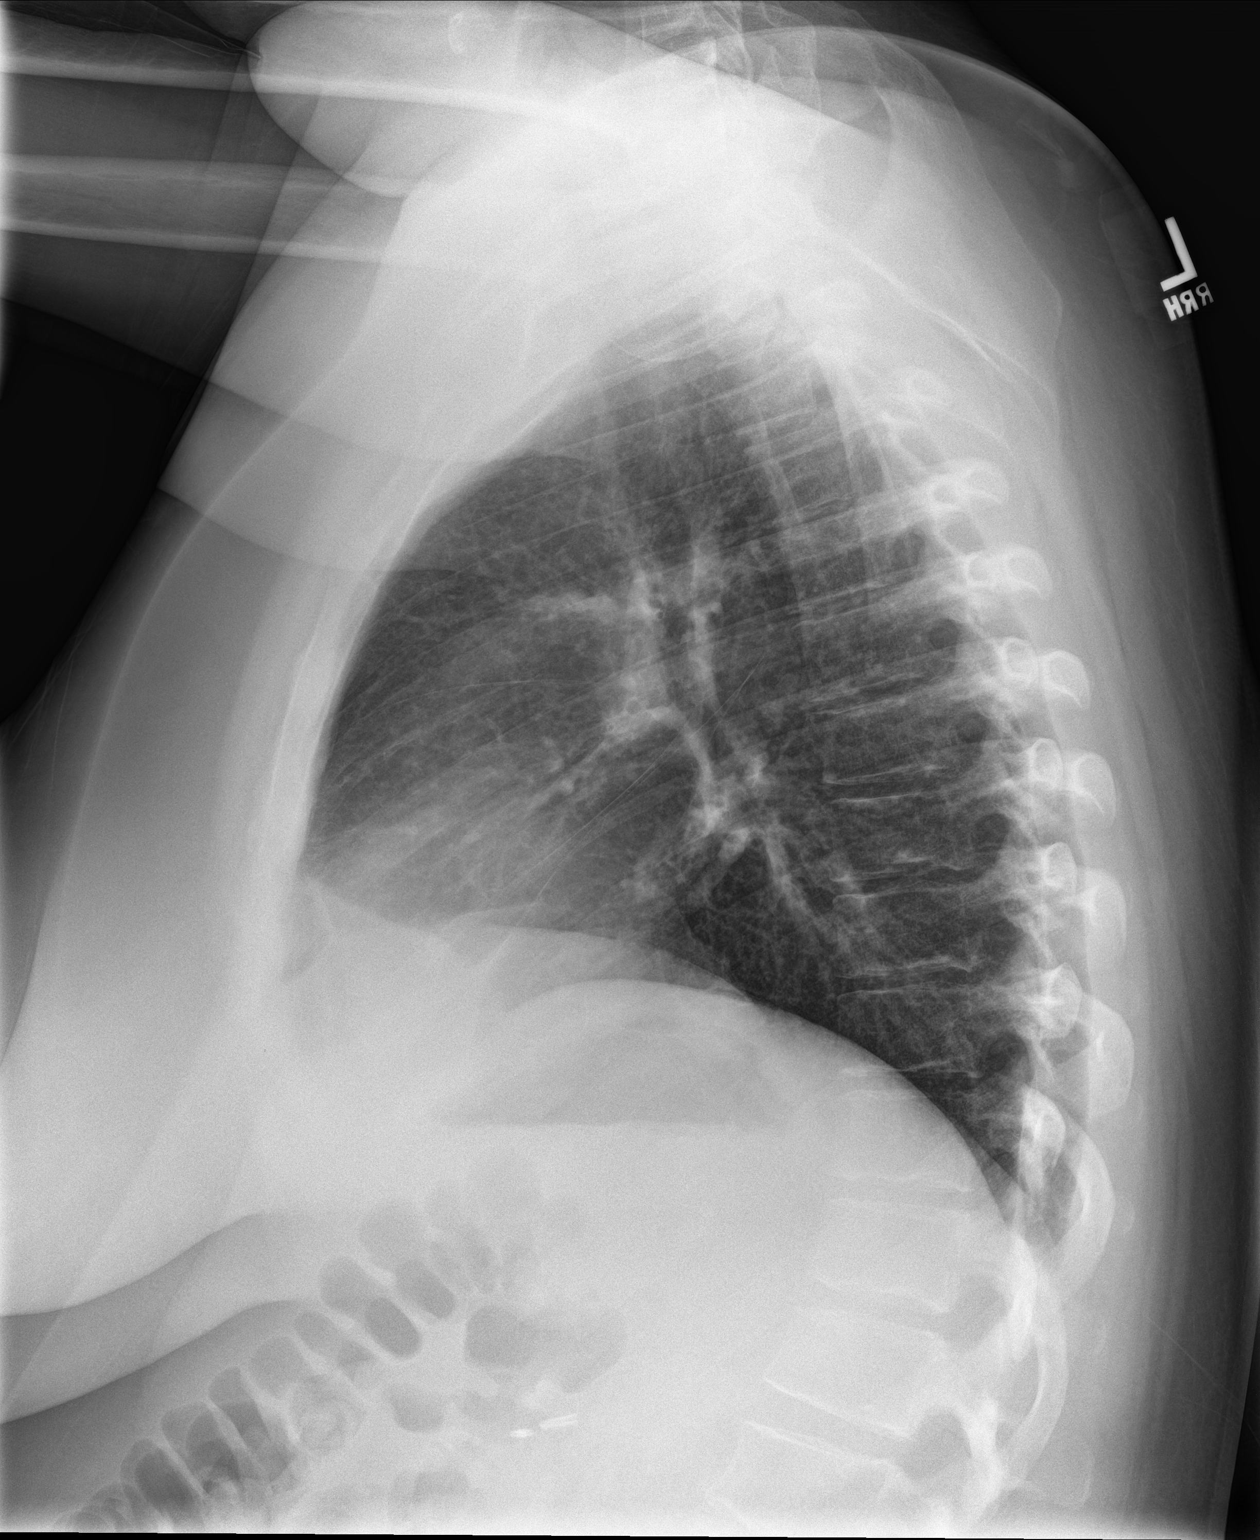

[2 of 2 positions shown; findings below may reference images not displayed]

FINDINGS: Mild peribronchial thickening. Patchy airspace disease in the left
base concerning for pneumonia. No effusions. Heart is normal size.
IMPRESSION: Bronchitic changes.

Left basilar airspace opacity compatible with pneumonia.

## 2016-09-09 MED ORDER — PREDNISONE 10 MG (21) PO TBPK: ORAL_TABLET | ORAL | 0 refills | Status: DC

## 2016-09-09 MED ORDER — AEROCHAMBER PLUS FLO-VU MEDIUM MISC
1.0000 | Freq: Once | Status: AC
  Administered 2016-09-09: 1
  Filled 2016-09-09: qty 1

## 2016-09-09 MED ORDER — SODIUM CHLORIDE 0.9 % IV BOLUS (SEPSIS)
1000.0000 mL | Freq: Once | INTRAVENOUS | Status: AC
  Administered 2016-09-09: 1000 mL via INTRAVENOUS

## 2016-09-09 MED ORDER — MORPHINE SULFATE (PF) 4 MG/ML IV SOLN
4.0000 mg | Freq: Once | INTRAVENOUS | Status: AC
  Administered 2016-09-09: 4 mg via INTRAVENOUS
  Filled 2016-09-09: qty 1

## 2016-09-09 MED ORDER — IPRATROPIUM-ALBUTEROL 0.5-2.5 (3) MG/3ML IN SOLN
3.0000 mL | Freq: Once | RESPIRATORY_TRACT | Status: AC
  Administered 2016-09-09: 3 mL via RESPIRATORY_TRACT
  Filled 2016-09-09: qty 3

## 2016-09-09 MED ORDER — DEXTROSE 5 % IV SOLN
1.0000 g | Freq: Once | INTRAVENOUS | Status: AC
  Administered 2016-09-09: 1 g via INTRAVENOUS
  Filled 2016-09-09: qty 10

## 2016-09-09 MED ORDER — HYDROCODONE-ACETAMINOPHEN 5-325 MG PO TABS: 1.0000 | ORAL_TABLET | ORAL | 0 refills | Status: DC | PRN

## 2016-09-09 MED ORDER — METHYLPREDNISOLONE SODIUM SUCC 125 MG IJ SOLR
125.0000 mg | Freq: Once | INTRAMUSCULAR | Status: AC
  Administered 2016-09-09: 125 mg via INTRAVENOUS
  Filled 2016-09-09: qty 2

## 2016-09-09 MED ORDER — ONDANSETRON HCL 4 MG/2ML IJ SOLN
4.0000 mg | Freq: Once | INTRAMUSCULAR | Status: AC
  Administered 2016-09-09: 4 mg via INTRAVENOUS
  Filled 2016-09-09: qty 2

## 2016-09-09 NOTE — ED Notes (Signed)
Patient given discharge instruction, verbalized understand. IV removed, band aid applied. Patient wheelchair out of the department. Pt to waiting area, called for a ride

## 2016-09-09 NOTE — ED Notes (Signed)
Pt getting breathing treatment

## 2016-09-09 NOTE — Discharge Instructions (Signed)
The prednisone will make your sugar go up.  Keep a close eye on your sugar and increase your insulin as needed.  Do not smoke!

## 2016-09-09 NOTE — ED Triage Notes (Signed)
Patient states she was treated for URI on Monday and given inhaler. Patient states that cough has gotten worse and states pain while coughing.

## 2016-09-09 NOTE — ED Notes (Signed)
Pt getting fluids and IV antibiotics, RT at the bedside to education on spacer

## 2016-09-09 NOTE — ED Notes (Signed)
MD at the bedside  

## 2016-09-09 NOTE — ED Provider Notes (Signed)
[facility] DEPT Provider Note   CSN: [id number] Arrival date & time: 09/09/16  1351     History   Chief Complaint Chief Complaint  Patient presents with  . Cough    HPI Lauren [name] is a 36 y.o. female.  Pt said she's had a cough for the past few days.  She did see her doctor 2 days ago and was put on zithromax.  She does not feel like she is getting better.  The pt said she has pain in her chest when she coughs.  The pt is a type 1 diabetic, and her blood sugar has been elevated.  She did take her meds this morning.  She does smoke, but has not been able to due to the coughing.      Past Medical History:  Diagnosis Date  . Diabetes mellitus without complication ([place])   . Myocardial infarction ([place])   . Renal disorder     Patient Active Problem List   Diagnosis Date Noted  . Vitamin D deficiency 07/24/2015  . Essential hypertension, benign 03/17/2015  . 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 Primary hypothyroidism 03/07/2015    Past Surgical History:  Procedure Laterality Date  . Cardiac catherization    . CHOLECYSTECTOMY    . TUBAL LIGATION      OB History    No data available       Home Medications    Prior to Admission medications   Medication Sig Start Date End Date Taking? Authorizing Provider  azithromycin (ZITHROMAX) 250 MG tablet Take 250 mg by mouth daily.   Yes [provider]  Blood Glucose Monitoring Suppl (BAYER CONTOUR NEXT MONITOR) w/Device KIT USE AS DIRECTED. 04/12/16  Yes Nida, Marella Chimes, MD  Cyanocobalamin (VITAMIN B 12 PO) Take by mouth. Patient isn't sure how much she takes. It is a liquid and she takes a "capful"   Yes [provider]  furosemide (LASIX) 40 MG tablet Take 40 mg by mouth daily.   Yes [provider]  glucose blood (ACCU-CHEK AVIVA) test strip To test upto 6 times a day 03/07/15  Yes Nida, Marella Chimes, MD  guaiFENesin-codeine (ROBITUSSIN AC) 100-10  MG/5ML syrup Take 5 mLs by mouth 4 (four) times daily as needed for cough.   Yes [provider]  insulin aspart (NOVOLOG FLEXPEN) 100 UNIT/ML FlexPen Inject 10-18 Units into the skin 3 (three) times daily with meals. Sliding scale   Yes [provider]  Insulin Pen Needle (B-D ULTRAFINE III SHORT PEN) 31G X 8 MM MISC 1 each by Does not apply route as directed. 03/07/15  Yes Nida, Marella Chimes, MD  LANTUS SOLOSTAR 100 UNIT/ML Solostar Pen INJECT 36 UNITS SUBCUTANEOUSLY EVERY MORNING. Patient taking differently: INJECT 48 UNITS SUBCUTANEOUSLY EVERY MORNING. 12/22/15  Yes Nida, Marella Chimes, MD  levothyroxine (SYNTHROID, LEVOTHROID) 125 MCG tablet Take 1 tablet by mouth daily. 08/12/16  Yes [provider]  pantoprazole (PROTONIX) 40 MG tablet Take 40 mg by mouth 2 (two) times daily.   Yes [provider]  potassium chloride SA (K-DUR,KLOR-CON) 20 MEQ tablet Take 20 mEq by mouth daily.   Yes [provider]  PROAIR HFA 108 (90 Base) MCG/ACT inhaler Take 1 Inhaler by mouth. 09/07/16  Yes [provider]  glucagon (GLUCAGON EMERGENCY) 1 MG injection Inject 1 mg into the vein once as needed. 04/10/15   Cassandria Anger, MD  HYDROcodone-acetaminophen (NORCO/VICODIN) 5-325 MG tablet Take 1 tablet by  mouth every 4 (four) hours as needed. 09/09/16   Isla Pence, MD  predniSONE (STERAPRED UNI-PAK 21 TAB) 10 MG (21) TBPK tablet Take 6 tabs by mouth daily  for 2 days, then 5 tabs for 2 days, then 4 tabs for 2 days, then 3 tabs for 2 days, 2 tabs for 2 days, then 1 tab by mouth daily for 2 days 09/09/16   Isla Pence, MD    Family History Family History  Problem Relation Age of Onset  . Thyroid disease Mother   . Hypertension Mother   . Hyperlipidemia Mother   . Hyperlipidemia Father   . Hypertension Father     Social History Social History  Substance Use Topics  . Smoking status: Former Smoker    Packs/day: 1.00    Types: Cigarettes    . Smokeless tobacco: Never Used  . Alcohol use No     Allergies   Wellbutrin [bupropion]; Ciprofloxacin hcl; and Tape   Review of Systems Review of Systems  Respiratory: Positive for cough, shortness of breath and wheezing.   All other systems reviewed and are negative.    Physical Exam Updated Vital Signs BP (!) 141/87   Pulse 92   Temp 99 F (37.2 C) (Oral)   Resp 18   Ht 5' 5"  (1.651 m)   Wt 240 lb (108.9 kg)   LMP 09/06/2016   SpO2 99%   BMI 39.94 kg/m   Physical Exam  Constitutional: She is oriented to person, place, and time. She appears well-developed and well-nourished.  HENT:  Head: Normocephalic and atraumatic.  Right Ear: External ear normal.  Left Ear: External ear normal.  Nose: Nose normal.  Mouth/Throat: Mucous membranes are dry.  Eyes: Conjunctivae and EOM are normal. Pupils are equal, round, and reactive to light.  Neck: Normal range of motion. Neck supple.  Cardiovascular: Normal rate, regular rhythm, normal heart sounds and intact distal pulses.   Pulmonary/Chest: She has wheezes.  Abdominal: Soft. Bowel sounds are normal.  Musculoskeletal: Normal range of motion.  Neurological: She is alert and oriented to person, place, and time.  Skin: Skin is warm.  Psychiatric: She has a normal mood and affect. Her behavior is normal. Judgment and thought content normal.  Nursing note and vitals reviewed.    ED Treatments / Results  Labs (all labs ordered are listed, but only abnormal results are displayed) Labs Reviewed  CBG MONITORING, ED - Abnormal; Notable for the following:       Result Value   Glucose-Capillary 348 (*)    All other components within normal limits    EKG  EKG Interpretation None       Radiology Dg Chest 2 View  Result Date: 09/09/2016 CLINICAL DATA:  Cough, posterior chest pain EXAM: CHEST  2 VIEW COMPARISON:  11/09/2008 FINDINGS: Mild peribronchial thickening. Patchy airspace disease in the left base concerning for  pneumonia. No effusions. Heart is normal size. IMPRESSION: Bronchitic changes. Left basilar airspace opacity compatible with pneumonia. Electronically Signed   By: Rolm Baptise M.D.   On: 09/09/2016 14:48    Procedures Procedures (including critical care time)  Medications Ordered in ED Medications  cefTRIAXone (ROCEPHIN) 1 g in dextrose 5 % 50 mL IVPB (1 g Intravenous New Bag/Given 09/09/16 1520)  sodium chloride 0.9 % bolus 1,000 mL (1,000 mLs Intravenous New Bag/Given 09/09/16 1500)  morphine 4 MG/ML injection 4 mg (4 mg Intravenous Given 09/09/16 1510)  ondansetron (ZOFRAN) injection 4 mg (4 mg Intravenous Given 09/09/16 1507)  methylPREDNISolone sodium succinate (SOLU-MEDROL) 125 mg/2 mL injection 125 mg (125 mg Intravenous Given 09/09/16 1514)  ipratropium-albuterol (DUONEB) 0.5-2.5 (3) MG/3ML nebulizer solution 3 mL (3 mLs Nebulization Given 09/09/16 1528)  AEROCHAMBER PLUS FLO-VU MEDIUM MISC 1 each (1 each Other Given 09/09/16 1528)     Initial Impression / Assessment and Plan / ED Course  I have reviewed the triage vital signs and the nursing notes.  Pertinent labs & imaging results that were available during my care of the patient were reviewed by me and considered in my medical decision making (see chart for details).    Steroids and nebs have made pt feel much better.  She is given a dose of rocephin here and told to continue her zithromax.  She is told the steroids will increase her blood sugar, but they will help her breathing.  She knows to increase her insulin as needed.    Pt knows to return if worse and to f/u with pcp.  Final Clinical Impressions(s) / ED Diagnoses   Final diagnoses:  Community acquired pneumonia of left lower lobe of lung ([place])  Tobacco abuse  Mild intermittent asthma with exacerbation  Poorly controlled type 1 diabetes mellitus ([facility])  Dehydration    New Prescriptions New Prescriptions   HYDROCODONE-ACETAMINOPHEN (NORCO/VICODIN) 5-325 MG TABLET     Take 1 tablet by mouth every 4 (four) hours as needed.   PREDNISONE (STERAPRED UNI-PAK 21 TAB) 10 MG (21) TBPK TABLET    Take 6 tabs by mouth daily  for 2 days, then 5 tabs for 2 days, then 4 tabs for 2 days, then 3 tabs for 2 days, 2 tabs for 2 days, then 1 tab by mouth daily for 2 days     Isla Pence, MD 09/09/16 1537

## 2016-09-13 ENCOUNTER — Encounter (HOSPITAL_COMMUNITY): Payer: Self-pay | Admitting: Emergency Medicine

## 2016-09-13 ENCOUNTER — Emergency Department (HOSPITAL_COMMUNITY)
Admission: EM | Admit: 2016-09-13 | Discharge: 2016-09-13 | Disposition: A | Discharge status: Home / Self Care | Payer: Self-pay | Attending: Emergency Medicine | Admitting: Emergency Medicine

## 2016-09-13 ENCOUNTER — Emergency Department (HOSPITAL_COMMUNITY): Payer: Self-pay

## 2016-09-13 DIAGNOSIS — R112 Nausea with vomiting, unspecified: Secondary | ICD-10-CM | POA: Insufficient documentation

## 2016-09-13 DIAGNOSIS — M549 Dorsalgia, unspecified: Secondary | ICD-10-CM

## 2016-09-13 DIAGNOSIS — R0602 Shortness of breath: Secondary | ICD-10-CM | POA: Insufficient documentation

## 2016-09-13 DIAGNOSIS — Z79899 Other long term (current) drug therapy: Secondary | ICD-10-CM | POA: Insufficient documentation

## 2016-09-13 DIAGNOSIS — R05 Cough: Secondary | ICD-10-CM | POA: Insufficient documentation

## 2016-09-13 DIAGNOSIS — M545 Low back pain: Secondary | ICD-10-CM | POA: Insufficient documentation

## 2016-09-13 DIAGNOSIS — Z87891 Personal history of nicotine dependence: Secondary | ICD-10-CM | POA: Insufficient documentation

## 2016-09-13 DIAGNOSIS — E039 Hypothyroidism, unspecified: Secondary | ICD-10-CM | POA: Insufficient documentation

## 2016-09-13 DIAGNOSIS — E119 Type 2 diabetes mellitus without complications: Secondary | ICD-10-CM | POA: Insufficient documentation

## 2016-09-13 DIAGNOSIS — Z794 Long term (current) use of insulin: Secondary | ICD-10-CM | POA: Insufficient documentation

## 2016-09-13 LAB — URINALYSIS, ROUTINE W REFLEX MICROSCOPIC
BILIRUBIN URINE: NEGATIVE
Glucose, UA: NEGATIVE mg/dL
Ketones, ur: NEGATIVE mg/dL
LEUKOCYTES UA: NEGATIVE
NITRITE: NEGATIVE
PH: 5 (ref 5.0–8.0)
Protein, ur: 100 mg/dL — AB
SPECIFIC GRAVITY, URINE: 1.013 (ref 1.005–1.030)

## 2016-09-13 LAB — I-STAT CHEM 8, ED
BUN: 27 mg/dL — AB (ref 6–20)
CHLORIDE: 101 mmol/L (ref 101–111)
CREATININE: 1.3 mg/dL — AB (ref 0.44–1.00)
Calcium, Ion: 1.07 mmol/L — ABNORMAL LOW (ref 1.15–1.40)
Glucose, Bld: 179 mg/dL — ABNORMAL HIGH (ref 65–99)
HEMATOCRIT: 44 % (ref 36.0–46.0)
Hemoglobin: 15 g/dL (ref 12.0–15.0)
POTASSIUM: 3.5 mmol/L (ref 3.5–5.1)
SODIUM: 139 mmol/L (ref 135–145)
TCO2: 27 mmol/L (ref 0–100)

## 2016-09-13 LAB — COMPREHENSIVE METABOLIC PANEL
ALK PHOS: 309 U/L — AB (ref 38–126)
ALT: 45 U/L (ref 14–54)
ANION GAP: 8 (ref 5–15)
AST: 21 U/L (ref 15–41)
Albumin: 2.8 g/dL — ABNORMAL LOW (ref 3.5–5.0)
BILIRUBIN TOTAL: 0.6 mg/dL (ref 0.3–1.2)
BUN: 26 mg/dL — ABNORMAL HIGH (ref 6–20)
CALCIUM: 8.6 mg/dL — AB (ref 8.9–10.3)
CO2: 27 mmol/L (ref 22–32)
CREATININE: 1.19 mg/dL — AB (ref 0.44–1.00)
Chloride: 103 mmol/L (ref 101–111)
GFR, EST NON AFRICAN AMERICAN: 59 mL/min — AB (ref >60)
Glucose, Bld: 179 mg/dL — ABNORMAL HIGH (ref 65–99)
Potassium: 3.7 mmol/L (ref 3.5–5.1)
SODIUM: 138 mmol/L (ref 135–145)
TOTAL PROTEIN: 6.1 g/dL — AB (ref 6.5–8.1)

## 2016-09-13 LAB — CBC WITH DIFFERENTIAL/PLATELET
BASOS PCT: 1 %
Basophils Absolute: 0.1 10*3/uL (ref 0.0–0.1)
Eosinophils Absolute: 0.1 10*3/uL (ref 0.0–0.7)
Eosinophils Relative: 1 %
HEMATOCRIT: 44.2 % (ref 36.0–46.0)
HEMOGLOBIN: 15.4 g/dL — AB (ref 12.0–15.0)
LYMPHS ABS: 4.6 10*3/uL — AB (ref 0.7–4.0)
Lymphocytes Relative: 43 %
MCH: 29.7 pg (ref 26.0–34.0)
MCHC: 34.8 g/dL (ref 30.0–36.0)
MCV: 85.2 fL (ref 78.0–100.0)
MONO ABS: 0.5 10*3/uL (ref 0.1–1.0)
MONOS PCT: 5 %
NEUTROS ABS: 5.3 10*3/uL (ref 1.7–7.7)
Neutrophils Relative %: 50 %
Platelets: 328 10*3/uL (ref 150–400)
RBC: 5.19 MIL/uL — ABNORMAL HIGH (ref 3.87–5.11)
RDW: 12.6 % (ref 11.5–15.5)
WBC: 10.5 10*3/uL (ref 4.0–10.5)

## 2016-09-13 IMAGING — DX DG CHEST 2V
2 series · 2 of 2 positions shown · non-contrast
Comparison: Prior radiograph from [DATE].

CLINICAL DATA: Initial valuation for acute shortness of breath,
cough.

EXAM:
CHEST  2 VIEW

[chest pa]
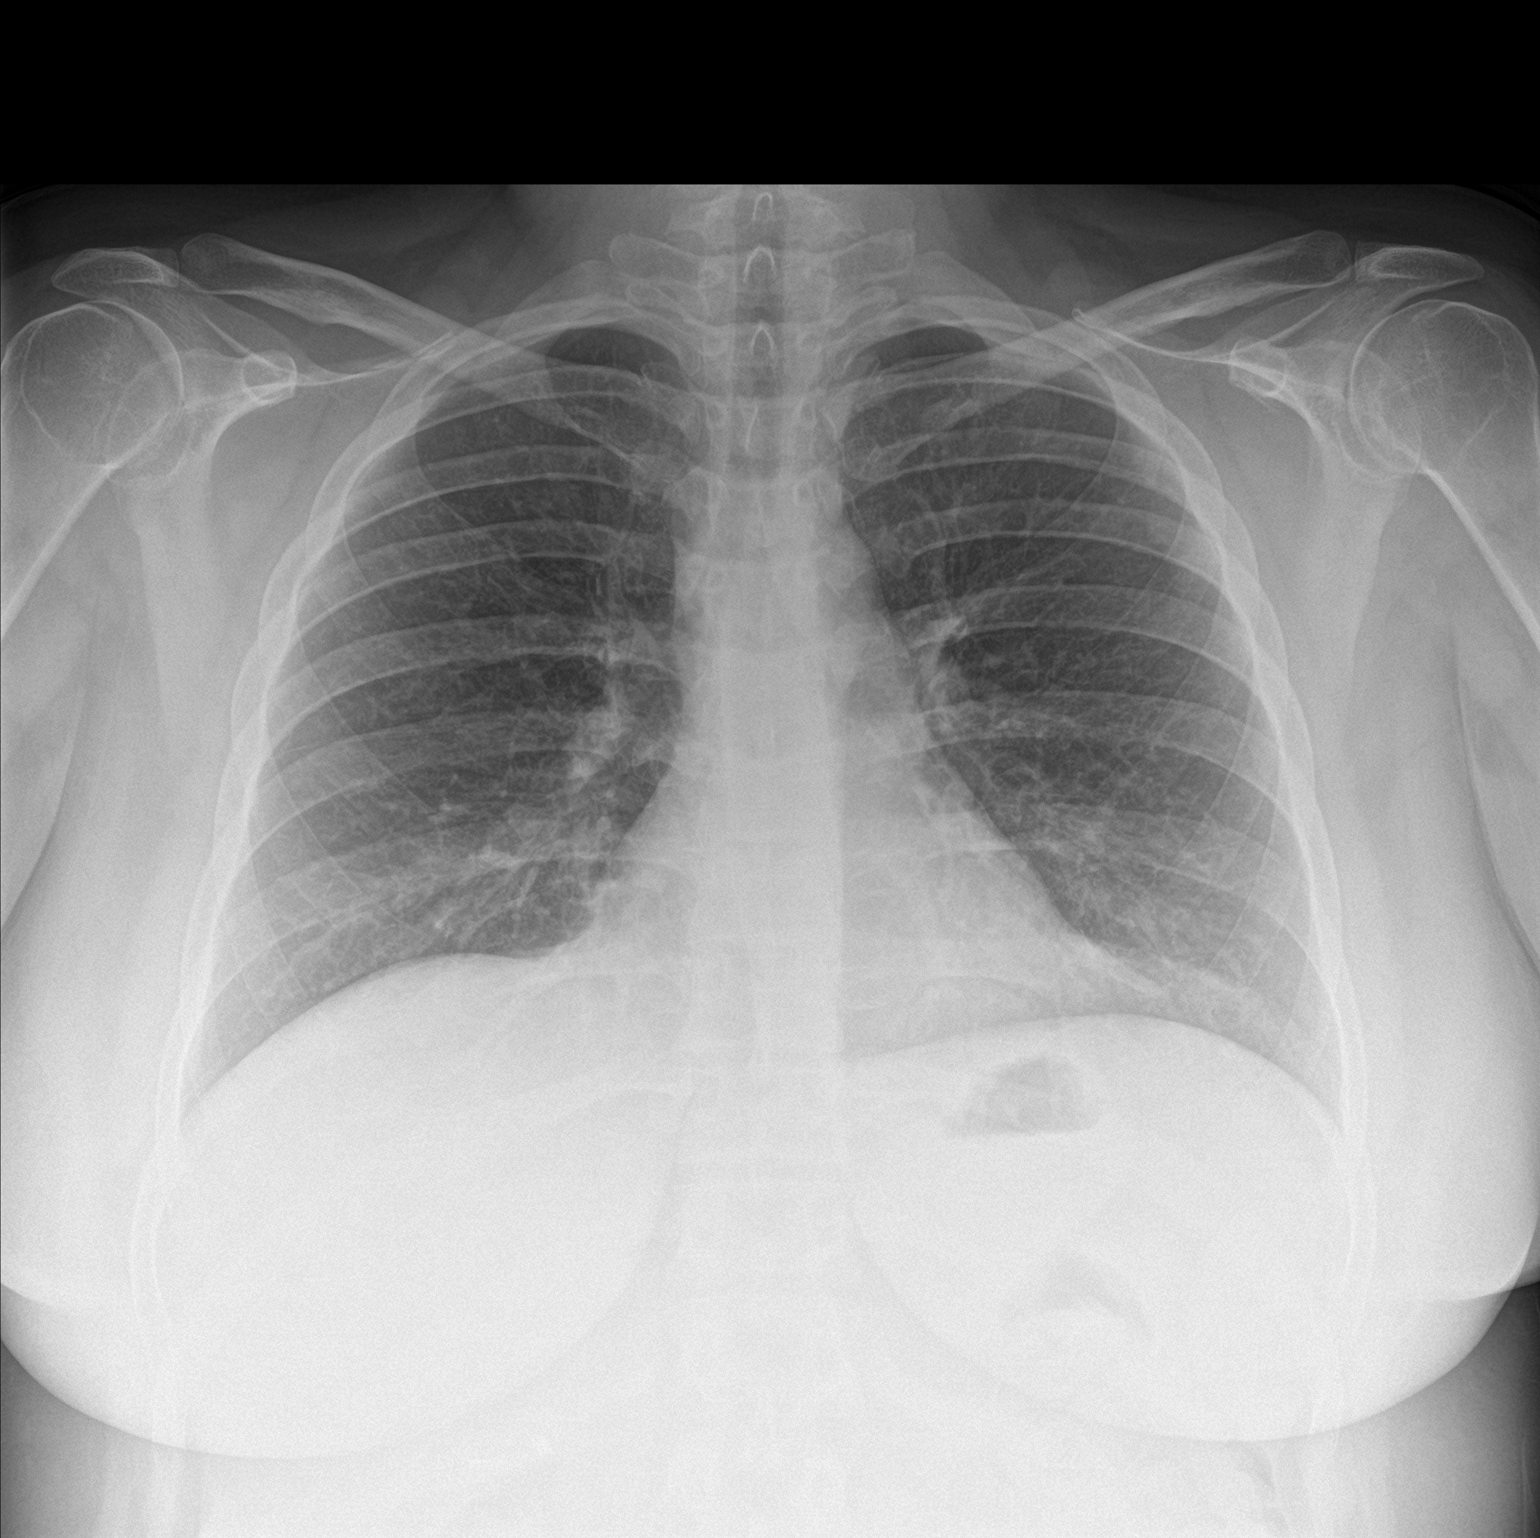

[chest lat]
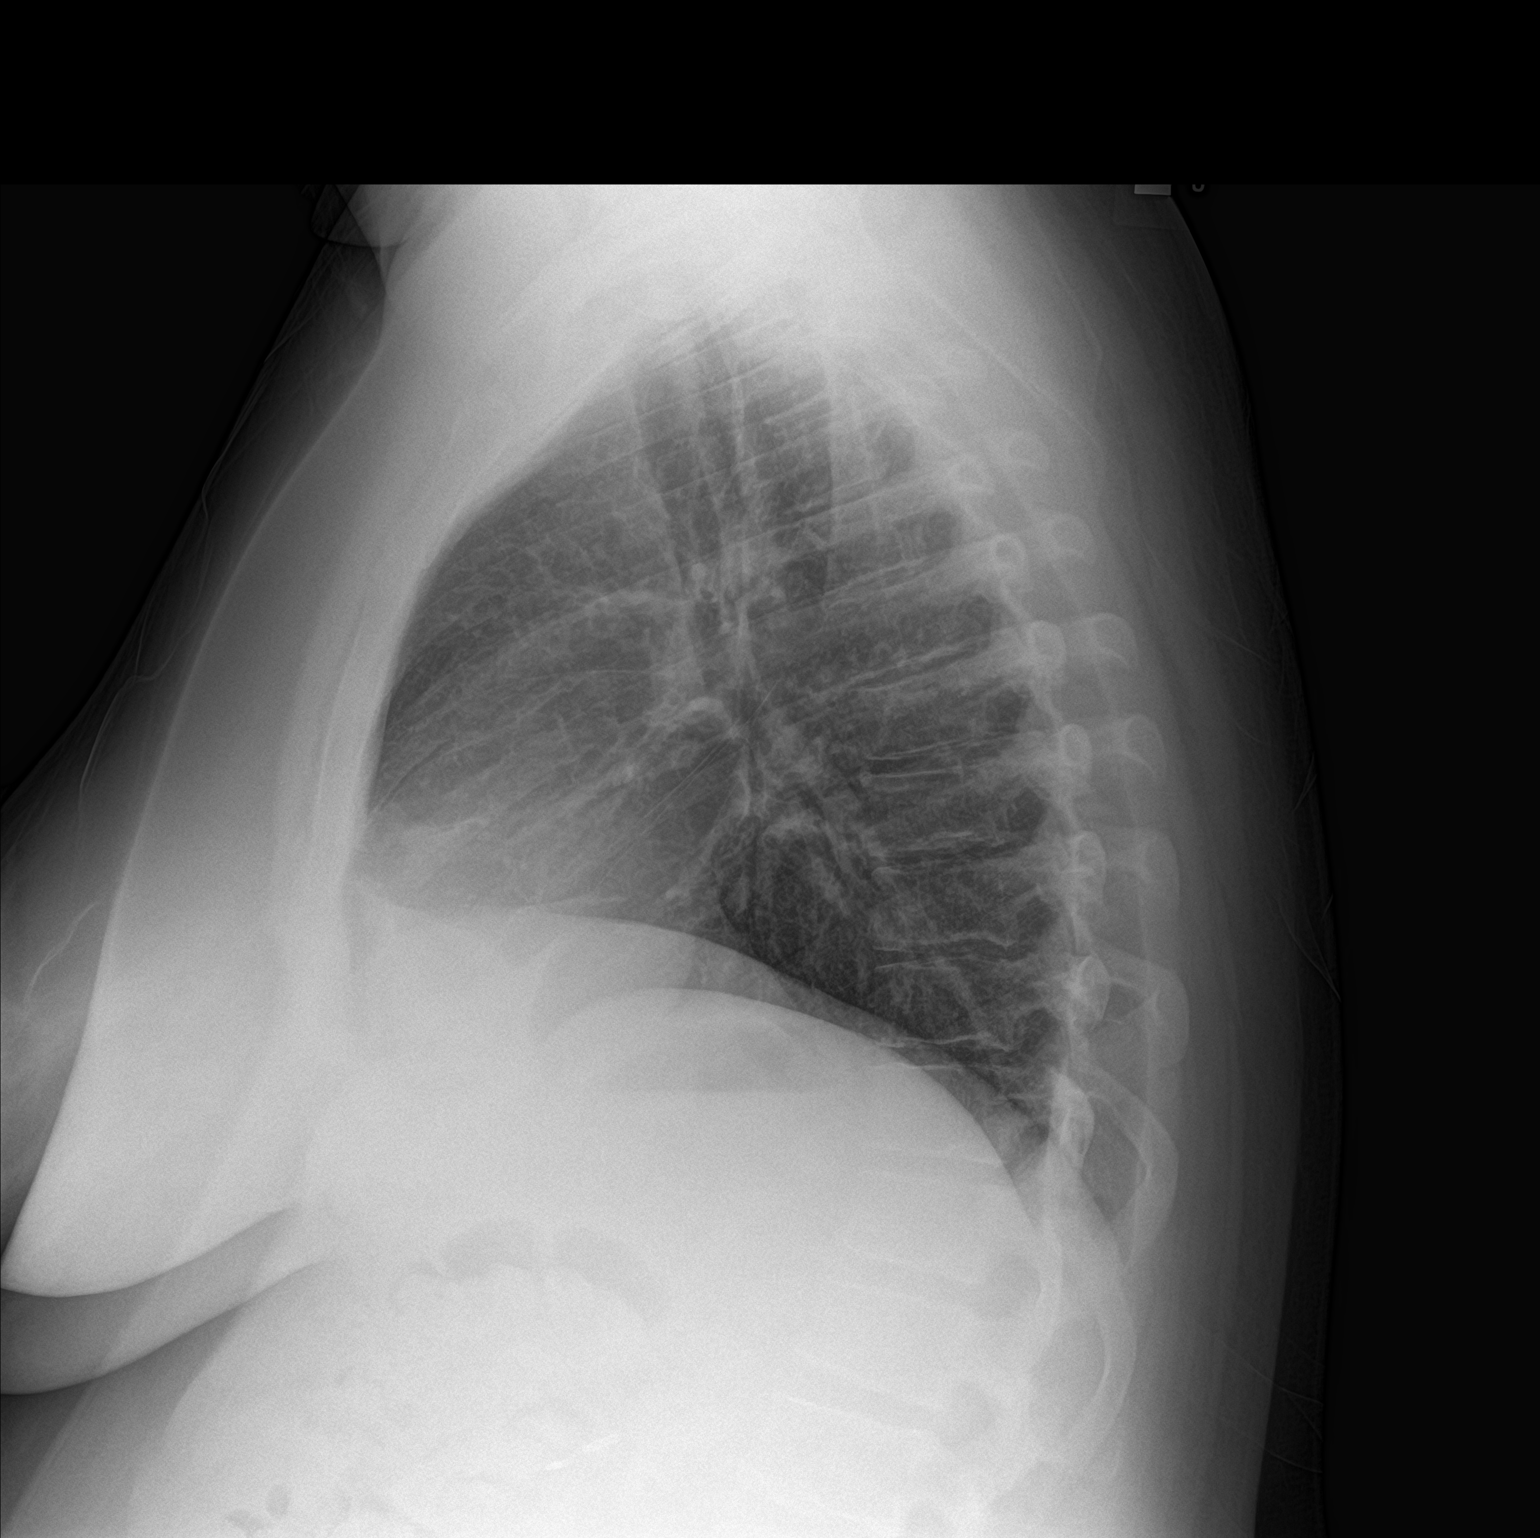

[2 of 2 positions shown; findings below may reference images not displayed]

FINDINGS: The cardiac and mediastinal silhouettes are stable in size and
contour, and remain within normal limits.

The lungs are normally inflated. No airspace consolidation, pleural
effusion, or pulmonary edema is identified. There is no
pneumothorax.

No acute osseous abnormality identified.
IMPRESSION: No active cardiopulmonary disease.

## 2016-09-13 MED ORDER — ONDANSETRON HCL 4 MG/2ML IJ SOLN
4.0000 mg | Freq: Once | INTRAMUSCULAR | Status: AC
  Administered 2016-09-13: 4 mg via INTRAVENOUS
  Filled 2016-09-13: qty 2

## 2016-09-13 MED ORDER — TRAMADOL HCL 50 MG PO TABS: 50.0000 mg | ORAL_TABLET | Freq: Four times a day (QID) | ORAL | 0 refills | Status: DC | PRN

## 2016-09-13 MED ORDER — SODIUM CHLORIDE 0.9 % IV BOLUS (SEPSIS)
1000.0000 mL | Freq: Once | INTRAVENOUS | Status: AC
  Administered 2016-09-13: 1000 mL via INTRAVENOUS

## 2016-09-13 MED ORDER — ONDANSETRON 4 MG PO TBDP: ORAL_TABLET | ORAL | 0 refills | Status: DC

## 2016-09-13 NOTE — ED Triage Notes (Addendum)
Pt reports that she has pneumonia and is complaining of nausea, pain in lower back and blood sugar is high. CBG 208 at drs office. She was sent here by her medical doctor. Pt reports that she has finished her abt, . Conts to be SOB with productive cough. Pt was sent here by Dr Karie Kirks

## 2016-09-13 NOTE — Discharge Instructions (Signed)
Follow up with your md in 2-3 days if not improving.  Drink plenty of fluids

## 2016-09-13 NOTE — ED Provider Notes (Signed)
[facility] DEPT Provider Note   CSN: [id number] Arrival date & time: 09/13/16  1645 By signing my name below, I, Dyke Brackett, attest that this documentation has been prepared under the direction and in the presence of Milton Ferguson, MD . Electronically Signed: Dyke Brackett, Scribe. 09/13/2016. 8:19 PM.   History   Chief Complaint Chief Complaint  Patient presents with  . Shortness of Breath  . Follow-up    HPI Lauren [name] is a 36 y.o. female with a hx of asthma, MI, and DMwho presents to the Emergency Department complaining of persistent shortness of breath onset last week. She was seen in the ED on 09/09/16 where she was diagnosed with pneumonia; at this time, pt was told to continue taking her Zithromax and was started on Prednisone. She reports associated productive cough, nausea, vomiting 2x, and back pain. Pt states she has finished her 5-day course of Zithromax with no significant relief of symptoms. No alleviating or modifying factors noted. She was seen by her PCP today where she had a CBG of 208 and was sent here for further evaluation. Pt denies any abdominal pain and has no other acute complaints or associated symptoms at this time.    The history is provided by the patient. No language interpreter was used.  Shortness of Breath  The problem occurs intermittently.The current episode started more than 2 days ago. The problem has not changed since onset.Associated symptoms include cough, sputum production and vomiting. Pertinent negatives include no headaches, no chest pain, no abdominal pain and no rash. She has tried oral steroids for the symptoms. The treatment provided no relief. She has had no prior hospitalizations. She has had prior ED visits (09/09/16, dx with pneumonia). Associated medical issues include pneumonia and past MI.    Past Medical History:  Diagnosis Date  . Diabetes mellitus without complication ([place])   . Myocardial infarction ([place])   . Renal  disorder     Patient Active Problem List   Diagnosis Date Noted  . Vitamin D deficiency 07/24/2015  . Essential hypertension, benign 03/17/2015  . 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 Primary hypothyroidism 03/07/2015    Past Surgical History:  Procedure Laterality Date  . Cardiac catherization    . CHOLECYSTECTOMY    . TUBAL LIGATION      OB History    No data available       Home Medications    Prior to Admission medications   Medication Sig Start Date End Date Taking? Authorizing Provider  azithromycin (ZITHROMAX) 250 MG tablet Take 250 mg by mouth daily.    [provider]  Blood Glucose Monitoring Suppl (BAYER CONTOUR NEXT MONITOR) w/Device KIT USE AS DIRECTED. 04/12/16   Nida, Marella Chimes, MD  Cyanocobalamin (VITAMIN B 12 PO) Take by mouth. Patient isn't sure how much she takes. It is a liquid and she takes a "capful"    [provider]  furosemide (LASIX) 40 MG tablet Take 40 mg by mouth daily.    [provider]  glucagon (GLUCAGON EMERGENCY) 1 MG injection Inject 1 mg into the vein once as needed. 04/10/15   Cassandria Anger, MD  glucose blood (ACCU-CHEK AVIVA) test strip To test upto 6 times a day 03/07/15   Cassandria Anger, MD  guaiFENesin-codeine [facility]) 100-10 MG/5ML syrup Take 5 mLs by mouth 4 (four) times daily as needed for cough.    [provider]  HYDROcodone-acetaminophen (NORCO/VICODIN) 5-325 MG tablet  Take 1 tablet by mouth every 4 (four) hours as needed. 09/09/16   Isla Pence, MD  insulin aspart (NOVOLOG FLEXPEN) 100 UNIT/ML FlexPen Inject 10-18 Units into the skin 3 (three) times daily with meals. Sliding scale    [provider]  Insulin Pen Needle (B-D ULTRAFINE III SHORT PEN) 31G X 8 MM MISC 1 each by Does not apply route as directed. 03/07/15   Cassandria Anger, MD  LANTUS SOLOSTAR 100 UNIT/ML Solostar Pen INJECT 36 UNITS SUBCUTANEOUSLY EVERY  MORNING. Patient taking differently: INJECT 48 UNITS SUBCUTANEOUSLY EVERY MORNING. 12/22/15   Cassandria Anger, MD  levothyroxine (SYNTHROID, LEVOTHROID) 125 MCG tablet Take 1 tablet by mouth daily. 08/12/16   [provider]  pantoprazole (PROTONIX) 40 MG tablet Take 40 mg by mouth 2 (two) times daily.    [provider]  potassium chloride SA (K-DUR,KLOR-CON) 20 MEQ tablet Take 20 mEq by mouth daily.    [provider]  predniSONE (STERAPRED UNI-PAK 21 TAB) 10 MG (21) TBPK tablet Take 6 tabs by mouth daily  for 2 days, then 5 tabs for 2 days, then 4 tabs for 2 days, then 3 tabs for 2 days, 2 tabs for 2 days, then 1 tab by mouth daily for 2 days 09/09/16   Isla Pence, MD  PROAIR HFA 108 [phone] Base) MCG/ACT inhaler Take 1 Inhaler by mouth. 09/07/16   [provider]    Family History Family History  Problem Relation Age of Onset  . Thyroid disease Mother   . Hypertension Mother   . Hyperlipidemia Mother   . Hyperlipidemia Father   . Hypertension Father     Social History Social History  Substance Use Topics  . Smoking status: Former Smoker    Packs/day: 1.00    Types: Cigarettes  . Smokeless tobacco: Never Used  . Alcohol use No     Allergies   Wellbutrin [bupropion]; Ciprofloxacin hcl; and Tape   Review of Systems Review of Systems  Constitutional: Negative for appetite change and fatigue.  HENT: Negative for congestion, ear discharge and sinus pressure.   Eyes: Negative for discharge.  Respiratory: Positive for cough, sputum production and shortness of breath.   Cardiovascular: Negative for chest pain.  Gastrointestinal: Positive for vomiting. Negative for abdominal pain and diarrhea.  Genitourinary: Negative for frequency and hematuria.  Musculoskeletal: Negative for back pain.  Skin: Negative for rash.  Neurological: Negative for seizures and headaches.  Psychiatric/Behavioral: Negative for hallucinations.     Physical  Exam Updated Vital Signs BP 121/89 (BP Location: Right Arm)   Pulse 87   Temp (!) 96.5 F (35.8 C) (Oral)   Resp (!) 22   Ht 5' 5"  (1.651 m)   Wt 240 lb (108.9 kg)   LMP 09/06/2016   SpO2 100%   BMI 39.94 kg/m   Physical Exam  Constitutional: She is oriented to person, place, and time. She appears well-developed.  HENT:  Head: Normocephalic.  Eyes: Conjunctivae and EOM are normal. No scleral icterus.  Neck: Neck supple. No thyromegaly present.  Cardiovascular: Normal rate and regular rhythm.  Exam reveals no gallop and no friction rub.   No murmur heard. Pulmonary/Chest: No stridor. She has no wheezes. She has no rales. She exhibits no tenderness.  Abdominal: She exhibits no distension. There is no tenderness. There is no rebound.  Musculoskeletal: Normal range of motion. She exhibits tenderness. She exhibits no edema.  Mild lumbar TTP  Lymphadenopathy:    She has no cervical  adenopathy.  Neurological: She is oriented to person, place, and time. She exhibits normal muscle tone. Coordination normal.  Skin: No rash noted. No erythema.  Psychiatric: She has a normal mood and affect. Her behavior is normal.    ED Treatments / Results  DIAGNOSTIC STUDIES:  Oxygen Saturation is 100% on RA, normal by my interpretation.    COORDINATION OF CARE:  8:10 PM Will order UA, CBC, CMP, I-stat chem 8 and I-stat hCG. Discussed treatment plan with pt at bedside and pt agreed to plan.   Labs (all labs ordered are listed, but only abnormal results are displayed) Labs Reviewed - No data to display  EKG  EKG Interpretation None       Radiology Dg Chest 2 View  Result Date: 09/13/2016 CLINICAL DATA:  Initial valuation for acute shortness of breath, cough. EXAM: CHEST  2 VIEW COMPARISON:  Prior radiograph from 09/09/2016. FINDINGS: The cardiac and mediastinal silhouettes are stable in size and contour, and remain within normal limits. The lungs are normally inflated. No airspace  consolidation, pleural effusion, or pulmonary edema is identified. There is no pneumothorax. No acute osseous abnormality identified. IMPRESSION: No active cardiopulmonary disease. Electronically Signed   By: Jeannine Boga M.D.   On: 09/13/2016 18:25    Procedures Procedures (including critical care time)  Medications Ordered in ED Medications - No data to display   Initial Impression / Assessment and Plan / ED Course  I have reviewed the triage vital signs and the nursing notes.  Pertinent labs & imaging results that were available during my care of the patient were reviewed by me and considered in my medical decision making (see chart for details).     Patient with nausea and back pain. Labs unremarkable including the urine. Patient will be treated symptomatically with Zofran and Ultram and follow-up with her PCP not improving  Final Clinical Impressions(s) / ED Diagnoses   Final diagnoses:  None    New Prescriptions New Prescriptions   No medications on file   The chart was scribed for me under my direct supervision.  I personally performed the history, physical, and medical decision making and all procedures in the evaluation of this patient.Milton Ferguson, MD 09/13/16 [phone]

## 2017-01-06 ENCOUNTER — Other Ambulatory Visit: Payer: Self-pay | Admitting: "Endocrinology

## 2018-09-15 ENCOUNTER — Other Ambulatory Visit: Payer: Self-pay | Admitting: "Endocrinology

## 2019-03-07 ENCOUNTER — Other Ambulatory Visit: Payer: Self-pay

## 2019-03-07 DIAGNOSIS — Z20822 Contact with and (suspected) exposure to covid-19: Secondary | ICD-10-CM

## 2019-03-07 NOTE — Addendum Note (Signed)
Addended by: Marvene Staff on: 03/07/2019 11:31 AM   Modules accepted: Orders

## 2019-03-08 LAB — NOVEL CORONAVIRUS, NAA: SARS-CoV-2, NAA: NOT DETECTED

## 2019-03-09 ENCOUNTER — Telehealth: Payer: Self-pay | Admitting: *Deleted

## 2019-03-09 NOTE — Telephone Encounter (Signed)
Pt called to get the results of covid-19 and she is advised that she is negative. She voiced understanding.

## 2019-04-03 ENCOUNTER — Emergency Department (HOSPITAL_COMMUNITY)
Admission: EM | Admit: 2019-04-03 | Discharge: 2019-04-03 | Disposition: A | Discharge status: Home / Self Care | Payer: BLUE CROSS/BLUE SHIELD | Attending: Emergency Medicine | Admitting: Emergency Medicine

## 2019-04-03 ENCOUNTER — Other Ambulatory Visit: Payer: Self-pay

## 2019-04-03 ENCOUNTER — Encounter (HOSPITAL_COMMUNITY): Payer: Self-pay | Admitting: *Deleted

## 2019-04-03 ENCOUNTER — Emergency Department (HOSPITAL_COMMUNITY): Payer: BLUE CROSS/BLUE SHIELD

## 2019-04-03 DIAGNOSIS — E1165 Type 2 diabetes mellitus with hyperglycemia: Secondary | ICD-10-CM | POA: Insufficient documentation

## 2019-04-03 DIAGNOSIS — I10 Essential (primary) hypertension: Secondary | ICD-10-CM | POA: Diagnosis not present

## 2019-04-03 DIAGNOSIS — R739 Hyperglycemia, unspecified: Secondary | ICD-10-CM

## 2019-04-03 DIAGNOSIS — S9031XA Contusion of right foot, initial encounter: Secondary | ICD-10-CM | POA: Diagnosis not present

## 2019-04-03 DIAGNOSIS — Z79899 Other long term (current) drug therapy: Secondary | ICD-10-CM | POA: Diagnosis not present

## 2019-04-03 DIAGNOSIS — Y929 Unspecified place or not applicable: Secondary | ICD-10-CM | POA: Diagnosis not present

## 2019-04-03 DIAGNOSIS — S99921A Unspecified injury of right foot, initial encounter: Secondary | ICD-10-CM | POA: Diagnosis present

## 2019-04-03 DIAGNOSIS — Y999 Unspecified external cause status: Secondary | ICD-10-CM | POA: Insufficient documentation

## 2019-04-03 DIAGNOSIS — Y93D3 Activity, furniture building and finishing: Secondary | ICD-10-CM | POA: Insufficient documentation

## 2019-04-03 DIAGNOSIS — W208XXA Other cause of strike by thrown, projected or falling object, initial encounter: Secondary | ICD-10-CM | POA: Insufficient documentation

## 2019-04-03 DIAGNOSIS — Z794 Long term (current) use of insulin: Secondary | ICD-10-CM | POA: Diagnosis not present

## 2019-04-03 DIAGNOSIS — Z87891 Personal history of nicotine dependence: Secondary | ICD-10-CM | POA: Insufficient documentation

## 2019-04-03 DIAGNOSIS — E039 Hypothyroidism, unspecified: Secondary | ICD-10-CM | POA: Insufficient documentation

## 2019-04-03 LAB — CBG MONITORING, ED: Glucose-Capillary: 413 mg/dL — ABNORMAL HIGH (ref 70–99)

## 2019-04-03 IMAGING — DX DG FOOT COMPLETE 3+V*R*
4 series · 4 of 4 positions shown · non-contrast
Comparison: None.

CLINICAL DATA: Foot injury

EXAM:
RIGHT FOOT COMPLETE - 3+ VIEW

[foot ap]
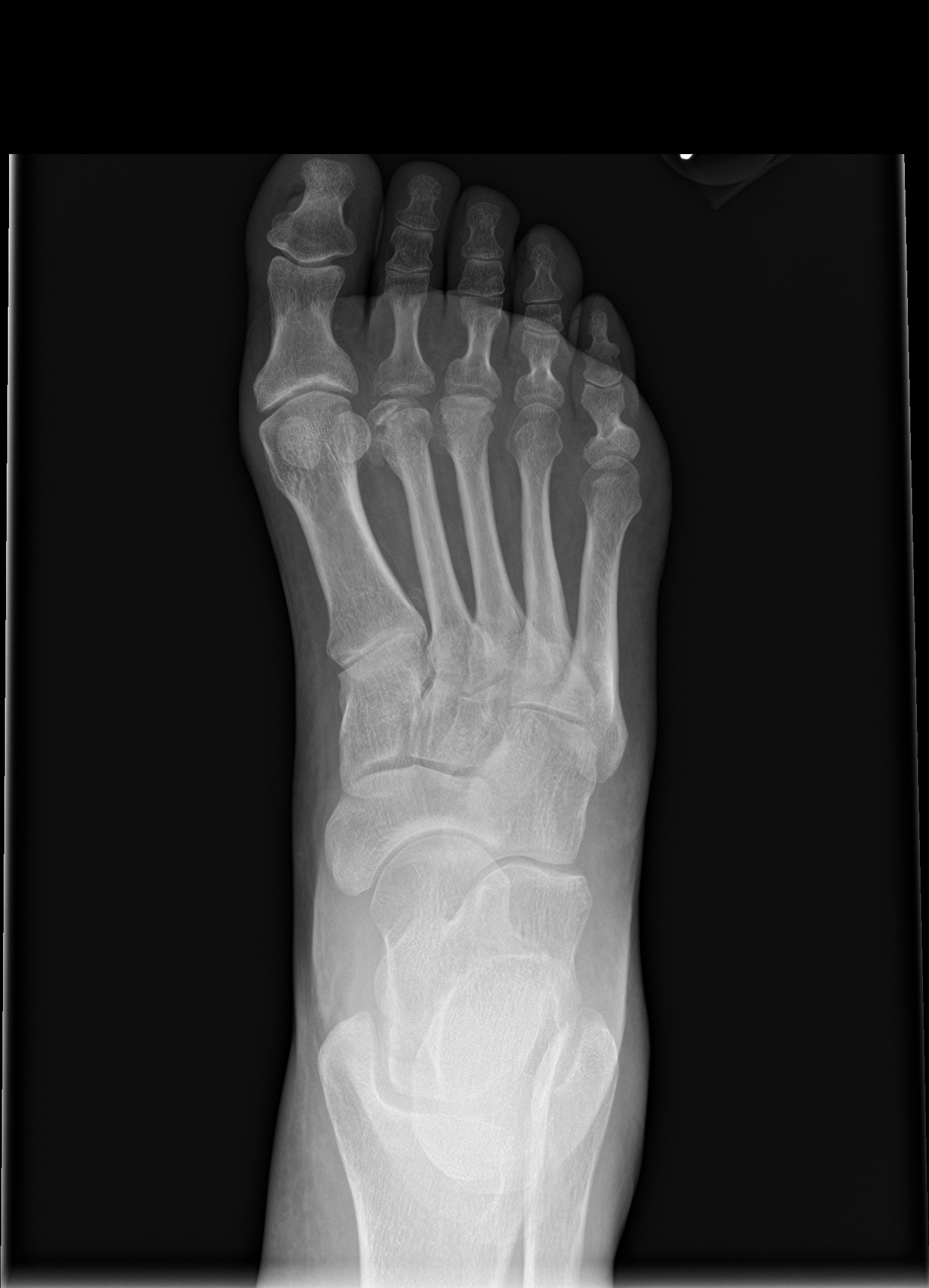

[foot obl]
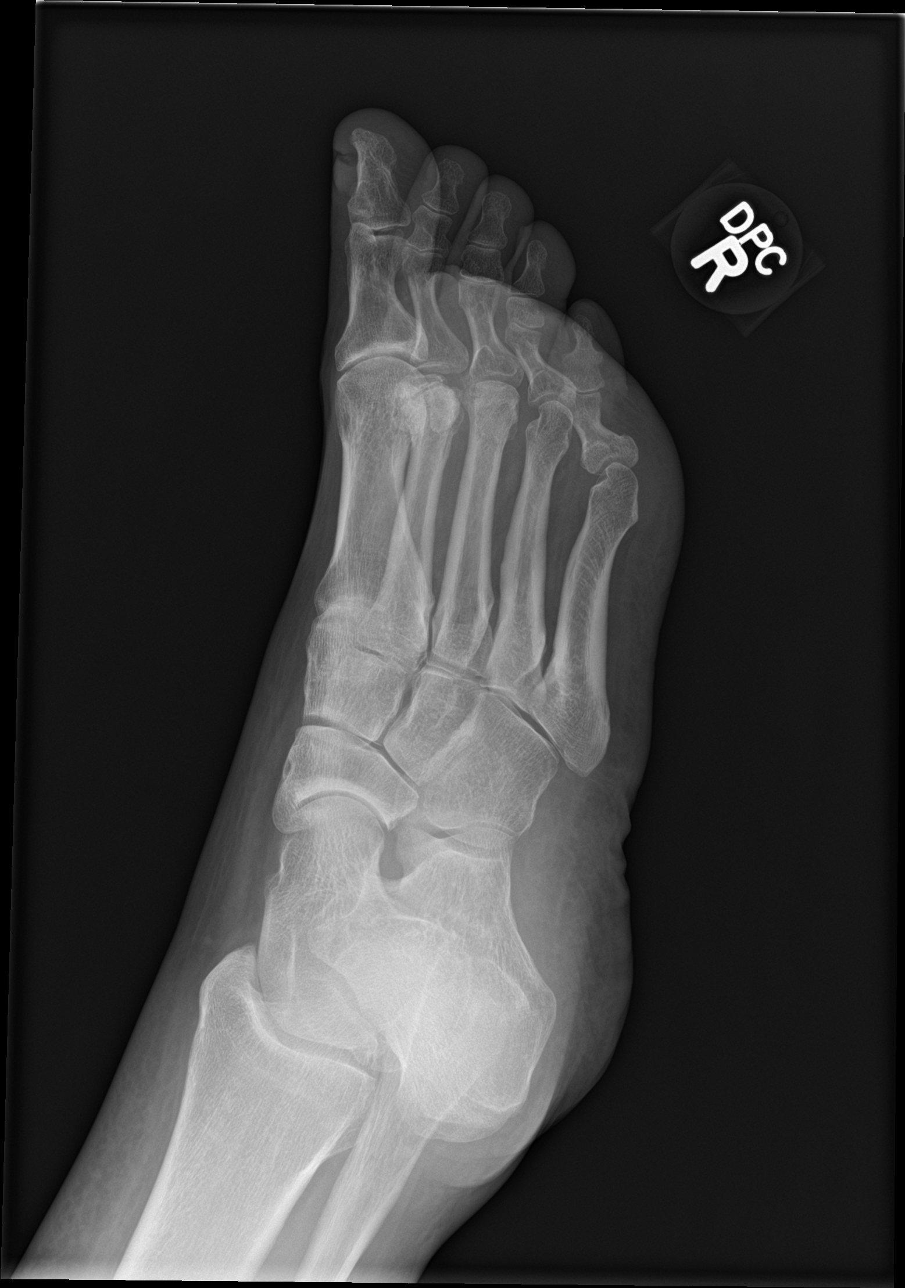

[foot lat (1 of 2)]
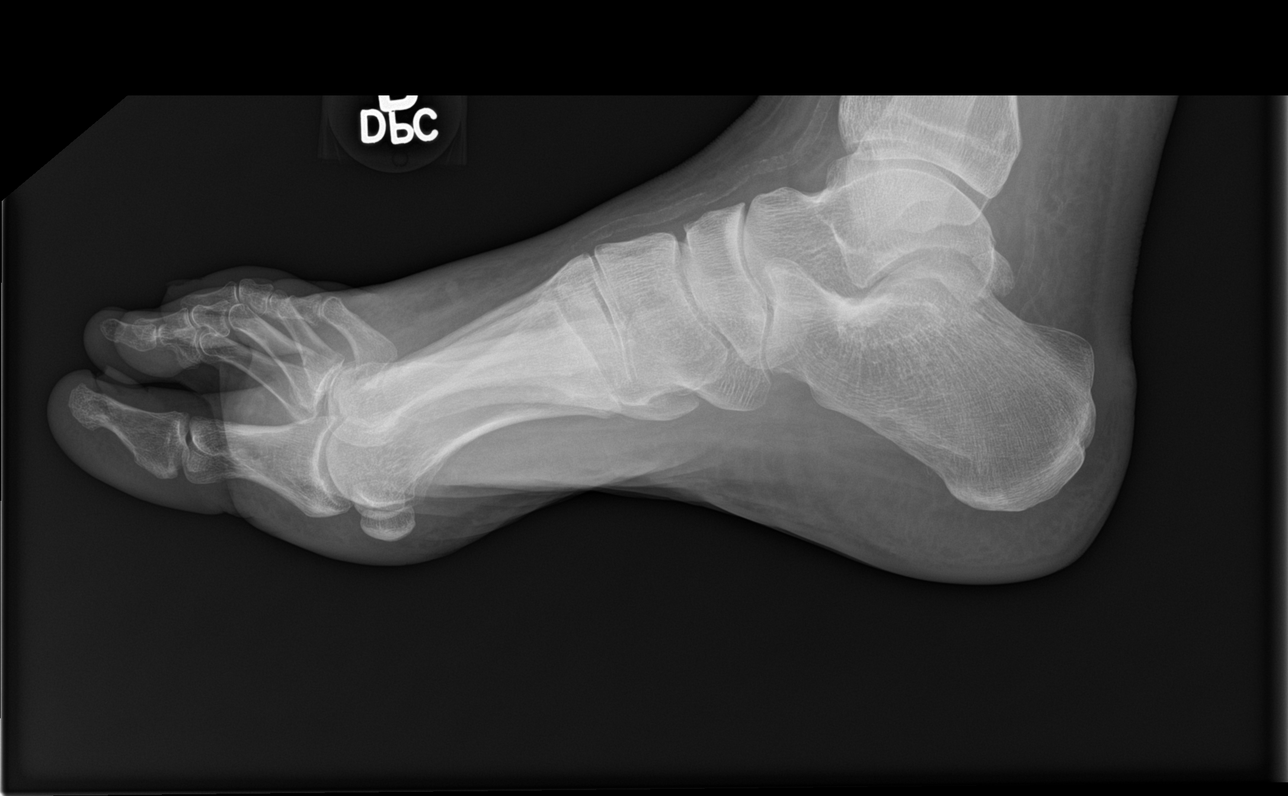

[foot lat (2 of 2)]
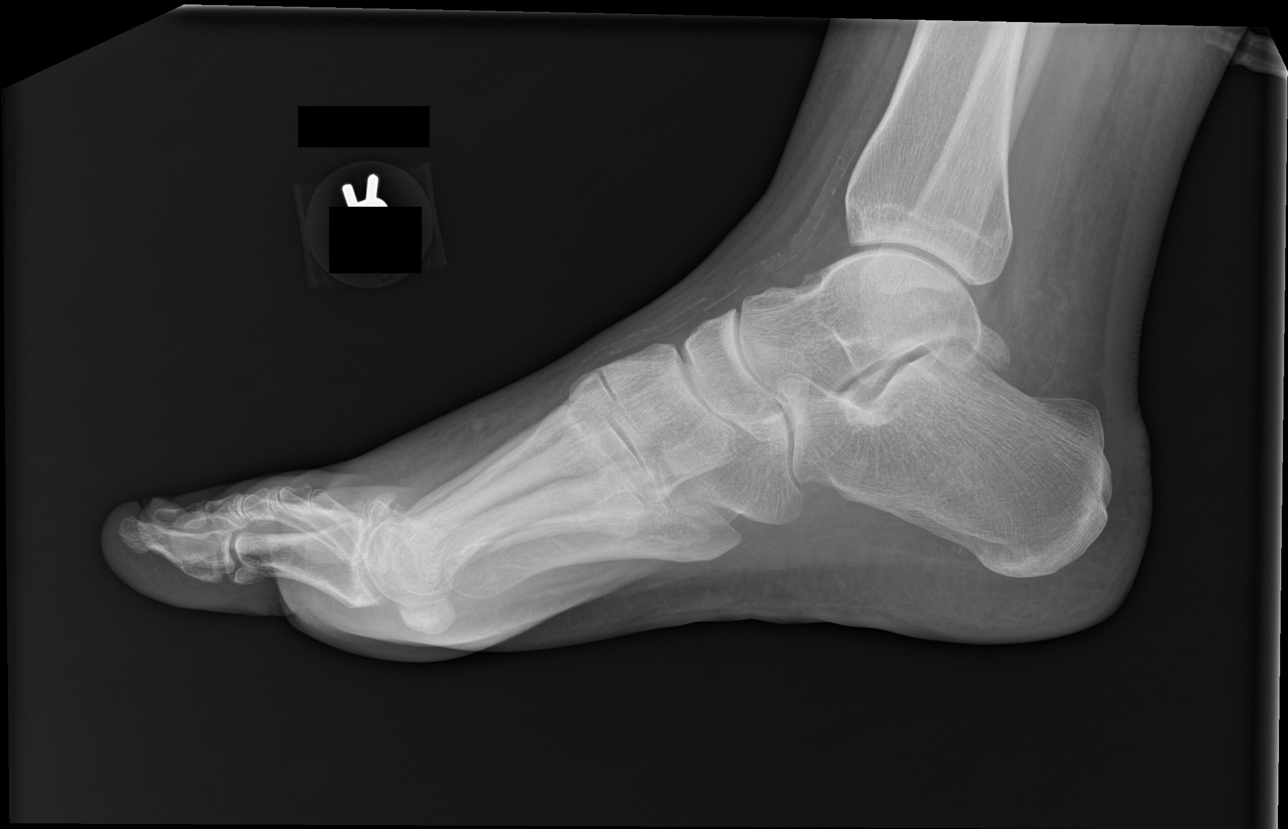

[4 of 4 positions shown; findings below may reference images not displayed]

FINDINGS: No fracture or dislocation. Dorsal soft tissue swelling is seen.
Scattered vascular calcifications are noted. No radiopaque foreign
body
IMPRESSION: No acute osseous abnormality.

## 2019-04-03 MED ORDER — NAPROXEN 500 MG PO TABS: 500.0000 mg | ORAL_TABLET | Freq: Two times a day (BID) | ORAL | 0 refills | Status: DC

## 2019-04-03 NOTE — Discharge Instructions (Signed)
Your x-ray shows no signs of broken bones  I have offered you further treatment for your high blood sugar but you have declined.  Please make sure that you are taking your medications exactly as prescribed and please feel comfortable coming back at any point should you change your mind about treating her blood sugar.  Drink plenty of fluids, you may return at any time if you change your mind or if your symptoms are becoming worsened.  You may use the postop shoe to help walk to stabilize her foot, take naproxen twice a day as needed for pain, do not use this for any longer than 2 weeks.  See your doctor if you are continuing to have symptoms beyond 72 hours however this should gradually start to improve within the next 24 hours

## 2019-04-03 NOTE — ED Triage Notes (Signed)
Pt states a piece of plywood dropped on her foot earlier today; pt has bruising and swelling to the top of her foot; pt able to move toes but with pain; pt states she is diabetic

## 2019-04-03 NOTE — ED Provider Notes (Addendum)
[facility] EMERGENCY DEPARTMENT Provider Note   CSN: [id number] Arrival date & time: 04/03/19  2219     History   Chief Complaint Chief Complaint  Patient presents with   Foot Pain    HPI Lauren [name] is a 38 y.o. female.     This is a very pleasant 38 year old female who is a known diabetic with peripheral neuropathy.  She does not take any anticoagulants.  She reports that this morning while she was trying to make a box spring out of a piece of plywood she dropped a full 8 foot x 4 foot piece of plywood on her foot.  This impacted her right foot distally on the dorsum, cause acute onset of pain which has made it difficult for her to walk today.  She has been able to walk though she states as little as possible.  She is placing most of her weight on her heel.  She has not applied any ice nor has she kept it elevated but she has taken ibuprofen 800 mg 3 different times today.  She has some associated bruising near the toes of the third fourth and fifth toes.  No open wounds, no bleeding, she does not take aspirin despite being told she needed to after having a myocardial infarction at the age of 57.  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      Home Medications    Prior to Admission medications   Medication Sig Start Date End Date  Taking? Authorizing Provider  azithromycin (ZITHROMAX) 250 MG tablet Take 250 mg by mouth daily.    [provider]  Blood Glucose Monitoring Suppl (BAYER CONTOUR NEXT MONITOR) w/Device KIT USE AS DIRECTED. 04/12/16   Nida, Marella Chimes, MD  CONTOUR NEXT TEST test strip USE TO TEST 6 TIMES DAILY. 09/15/18   Cassandria Anger, MD  Cyanocobalamin (VITAMIN B 12 PO) Take by mouth daily.     [provider]  furosemide (LASIX) 40 MG tablet Take 40 mg by mouth daily.    [provider]  glucagon (GLUCAGON EMERGENCY) 1 MG injection Inject 1 mg into the vein once as needed. 04/10/15   Cassandria Anger, MD  guaiFENesin-codeine (ROBITUSSIN AC) 100-10 MG/5ML syrup Take 5 mLs by mouth 4 (four) times daily as needed for cough.    [provider]  HYDROcodone-acetaminophen (NORCO/VICODIN) 5-325 MG tablet Take 1 tablet by mouth every 4 (four) hours as needed. 09/09/16   Isla Pence, MD  insulin aspart (NOVOLOG FLEXPEN) 100 UNIT/ML FlexPen Inject 10-18 Units into the skin 3 (three) times daily with meals. Sliding scale    [provider]  Insulin Pen Needle (B-D ULTRAFINE III SHORT PEN) 31G X 8 MM MISC 1 each by Does not apply route as directed.  03/07/15   Cassandria Anger, MD  LANTUS SOLOSTAR 100 UNIT/ML Solostar Pen INJECT 36 UNITS SUBCUTANEOUSLY EVERY MORNING. Patient taking differently: INJECT 48 UNITS SUBCUTANEOUSLY EVERY MORNING. 12/22/15   Cassandria Anger, MD  levothyroxine (SYNTHROID, LEVOTHROID) 125 MCG tablet Take 1 tablet by mouth daily. 08/12/16   [provider]  naproxen (NAPROSYN) 500 MG tablet Take 1 tablet (500 mg total) by mouth 2 (two) times daily with a meal. 04/03/19   Noemi Chapel, MD  ondansetron (ZOFRAN ODT) 4 MG disintegrating tablet 10m ODT q4 hours prn nausea/vomit 09/13/16   ZMilton Ferguson MD  pantoprazole (PROTONIX) 40 MG tablet Take 40 mg by mouth 2 (two) times daily.    [provider]  potassium  chloride SA (K-DUR,KLOR-CON) 20 MEQ tablet Take 20 mEq by mouth daily.    [provider]  predniSONE (STERAPRED UNI-PAK 21 TAB) 10 MG (21) TBPK tablet Take 6 tabs by mouth daily  for 2 days, then 5 tabs for 2 days, then 4 tabs for 2 days, then 3 tabs for 2 days, 2 tabs for 2 days, then 1 tab by mouth daily for 2 days 09/09/16   HIsla Pence MD  [facility] Center[facility]FA 108 [phone]) MCG/ACT inhaler Take 1 Inhaler by mouth every 6 (six) hours as needed for wheezing or shortness of breath.  09/07/16   [provider]  traMADol (ULTRAM) 50 MG tablet Take 1 tablet (50 mg total) by mouth every 6 (six) hours as needed. 09/13/16   ZMilton Ferguson MD    Family History Family History  Problem Relation Age of Onset   Thyroid disease Mother    Hypertension Mother    Hyperlipidemia Mother    Hyperlipidemia Father    Hypertension Father     Social History Social History   Tobacco Use   Smoking status: Former Smoker    Packs/day: 1.00    Types: Cigarettes   Smokeless tobacco: Never Used  Substance Use Topics   Alcohol use: No    Alcohol/week: 0.0 standard drinks   Drug use: No     Allergies   Wellbutrin [bupropion], Ciprofloxacin hcl, and Tape   Review of Systems Review of Systems  Musculoskeletal: Negative for joint swelling.  Skin: Negative for wound.  Neurological: Positive for numbness ( neuropathy (chronic)).  Hematological: Does not bruise/bleed easily.     Physical Exam Updated Vital Signs BP (!) 160/95 (BP Location: Right Arm)    Pulse (!) 107    Temp 98.7 F (37.1 C) (Oral)    Resp 18    Ht 1.651 m (5' 5" )    Wt 104.3 kg    LMP 03/20/2019    SpO2 100%    BMI 38.27 kg/m   Physical Exam Vitals signs and nursing note reviewed.  Constitutional:      Appearance: She is well-developed. She is not diaphoretic.  HENT:     Head: Normocephalic and atraumatic.  Eyes:     General:        Right eye: No discharge.        Left eye: No discharge.      Conjunctiva/sclera: Conjunctivae normal.  Cardiovascular:     Rate and Rhythm: Tachycardia present.     Comments: Pulse of 105, normal pulses at the dorsalis pedis of the right foot Pulmonary:     Effort: Pulmonary effort is normal. No respiratory distress.  Musculoskeletal:        General: Tenderness and signs of injury present. No deformity.  Right lower leg: No edema.     Left lower leg: No edema.     Comments: There is no tenderness over the base of the fifth metatarsal or the midfoot or bilateral ankles.  She has normal range of motion of the feet and ankles bilaterally.  She has tenderness with palpation over the right distal foot, there are no open wounds or bleeding  Skin:    General: Skin is warm and dry.     Findings: Bruising ( To the dorsum of the distal right foot at the base of the third fourth and fifth toes) present. No erythema or rash.  Neurological:     Mental Status: She is alert.     Coordination: Coordination normal.     Comments: Slight decrease sensation to the bilateral toes consistent with chronic neuropathy      ED Treatments / Results  Labs (all labs ordered are listed, but only abnormal results are displayed) Labs Reviewed  CBG MONITORING, ED - Abnormal; Notable for the following components:      Result Value   Glucose-Capillary 413 (*)    All other components within normal limits    EKG None  Radiology Dg Foot Complete Right  Result Date: 04/03/2019 CLINICAL DATA:  Foot injury EXAM: RIGHT FOOT COMPLETE - 3+ VIEW COMPARISON:  None. FINDINGS: No fracture or dislocation. Dorsal soft tissue swelling is seen. Scattered vascular calcifications are noted. No radiopaque foreign body IMPRESSION: No acute osseous abnormality. Electronically Signed   By: Prudencio Pair M.D.   On: 04/03/2019 23:18    Procedures Procedures (including critical care time)  Medications Ordered in ED Medications - No data to display   Initial Impression / Assessment and  Plan / ED Course  I have reviewed the triage vital signs and the nursing notes.  Pertinent labs & imaging results that were available during my care of the patient were reviewed by me and considered in my medical decision making (see chart for details).        The patient is able to bear weight on her heel, she appears to have a distal right foot injury and will need an x-ray to rule out fracture, bruising at the minimum, splint as needed otherwise rice therapy at home, the patient is agreeable to the plan.  She otherwise appears well and her mild tachycardia is likely secondary to her pain however the patient has a history of severe hyperglycemia which prompted her myocardial infarction, will check a CBG.  She is not nauseated, she is not having any polyuria or polydipsia.  I have personally viewed and interpreted the x-ray, there is no signs of broken bones or dislocations.  I agree with radiologist interpretation  Blood sugar is over 400, the patient was advised that further treatment would be indicated to help lower the sugar in case she had diabetic ketoacidosis.  I have rechecked her pulse which is now less than 100.  She states that she does not want stay in the hospital, she wants to go home but will come back should her symptoms worsen.  She is aware that she may return at any time.  She will be given anti-inflammatory prescription for home and a postop shoe and crutches if she needs them, she is agreeable to the plan.  Final Clinical Impressions(s) / ED Diagnoses   Final diagnoses:  Contusion of right foot, initial encounter  Hyperglycemia    ED Discharge Orders         Ordered  naproxen (NAPROSYN) 500 MG tablet  2 times daily with meals     04/03/19 2328           Noemi Chapel, MD 04/03/19 0256    Noemi Chapel, MD 04/03/19 [phone]

## 2019-11-25 ENCOUNTER — Encounter (HOSPITAL_COMMUNITY): Payer: Self-pay | Admitting: Emergency Medicine

## 2019-11-25 ENCOUNTER — Other Ambulatory Visit: Payer: Self-pay

## 2019-11-25 ENCOUNTER — Emergency Department (HOSPITAL_COMMUNITY)
Admission: EM | Admit: 2019-11-25 | Discharge: 2019-11-25 | Disposition: A | Discharge status: Home / Self Care | Payer: BLUE CROSS/BLUE SHIELD | Attending: Emergency Medicine | Admitting: Emergency Medicine

## 2019-11-25 DIAGNOSIS — I10 Essential (primary) hypertension: Secondary | ICD-10-CM | POA: Diagnosis not present

## 2019-11-25 DIAGNOSIS — E1059 Type 1 diabetes mellitus with other circulatory complications: Secondary | ICD-10-CM | POA: Diagnosis not present

## 2019-11-25 DIAGNOSIS — I252 Old myocardial infarction: Secondary | ICD-10-CM | POA: Diagnosis not present

## 2019-11-25 DIAGNOSIS — R2241 Localized swelling, mass and lump, right lower limb: Secondary | ICD-10-CM | POA: Diagnosis present

## 2019-11-25 DIAGNOSIS — E039 Hypothyroidism, unspecified: Secondary | ICD-10-CM | POA: Diagnosis not present

## 2019-11-25 DIAGNOSIS — Z87891 Personal history of nicotine dependence: Secondary | ICD-10-CM | POA: Insufficient documentation

## 2019-11-25 DIAGNOSIS — L03115 Cellulitis of right lower limb: Secondary | ICD-10-CM

## 2019-11-25 LAB — CBG MONITORING, ED: Glucose-Capillary: 95 mg/dL (ref 70–99)

## 2019-11-25 MED ORDER — CEPHALEXIN 500 MG PO CAPS
500.0000 mg | ORAL_CAPSULE | Freq: Once | ORAL | Status: AC
  Administered 2019-11-25: 500 mg via ORAL
  Filled 2019-11-25: qty 1

## 2019-11-25 MED ORDER — RIVAROXABAN 15 MG PO TABS
15.0000 mg | ORAL_TABLET | Freq: Once | ORAL | Status: AC
  Administered 2019-11-25: 15 mg via ORAL
  Filled 2019-11-25: qty 1

## 2019-11-25 MED ORDER — CEPHALEXIN 500 MG PO CAPS: 500.0000 mg | ORAL_CAPSULE | Freq: Four times a day (QID) | ORAL | 0 refills | Status: DC

## 2019-11-25 NOTE — ED Triage Notes (Addendum)
Patient c/o right leg swelling that started Friday and has progressively gotten worse. Denies any injuries or hx of DVT. Denies any pain per patient tingling sensation with ambulation. Significant swelling noted. Temp of leg WNL. Some redness noted to right leg. Denies any pain with palpitation.

## 2019-11-25 NOTE — ED Provider Notes (Signed)
[facility] EMERGENCY DEPARTMENT Provider Note   CSN: [id number] Arrival date & time: 11/25/19  1653     History Chief Complaint  Patient presents with   Leg Swelling    Lauren [name] is a 39 y.o. female.  Pt presents to the ED today with right lower leg swelling and redness.  Pt said she woke up on 7/23 am with sx.  Pt said she has not had any f/c.  No cp or sob.  No hx DVT.        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   Primary hypothyroidism 03/07/2015    Past Surgical History:  Procedure Laterality Date   Cardiac catherization     CHOLECYSTECTOMY     TUBAL LIGATION       OB History    Gravida  2   Para  2   Term  2   Preterm      AB      Living  2     SAB      TAB      Ectopic      Multiple      Live Births              Family History  Problem Relation Age of Onset   Thyroid disease Mother    Hypertension Mother    Hyperlipidemia Mother    Hyperlipidemia Father    Hypertension Father     Social History   Tobacco Use   Smoking status: Former Smoker    Packs/day: 1.00    Types: Cigarettes   Smokeless tobacco: Never Used  Scientific laboratory technician Use: Never used  Substance Use Topics   Alcohol use: No    Alcohol/week: 0.0 standard drinks   Drug use: No    Home Medications Prior to Admission medications   Medication Sig Start Date End Date Taking? Authorizing Provider  azithromycin (ZITHROMAX) 250 MG tablet Take 250 mg by mouth daily.    [provider]  Blood Glucose Monitoring Suppl (BAYER CONTOUR NEXT MONITOR) w/Device KIT USE AS DIRECTED. 04/12/16   Nida,  Marella Chimes, MD  cephALEXin (KEFLEX) 500 MG capsule Take 1 capsule (500 mg total) by mouth 4 (four) times daily. 11/25/19   Isla Pence, MD  CONTOUR NEXT TEST test strip USE TO TEST 6 TIMES DAILY. 09/15/18   Cassandria Anger, MD  Cyanocobalamin (VITAMIN B 12 PO) Take by mouth daily.     [provider]  furosemide (LASIX) 40 MG tablet Take 40 mg by mouth daily.    [provider]  glucagon (GLUCAGON EMERGENCY) 1 MG injection Inject 1 mg into the vein once as needed. 04/10/15   Cassandria Anger, MD  guaiFENesin-codeine (ROBITUSSIN AC) 100-10 MG/5ML syrup Take 5 mLs by mouth 4 (four) times daily as needed for cough.    [provider]  HYDROcodone-acetaminophen (NORCO/VICODIN) 5-325 MG tablet Take 1 tablet by mouth every 4 (four) hours as needed. 09/09/16   Isla Pence, MD  insulin aspart (NOVOLOG FLEXPEN) 100 UNIT/ML FlexPen Inject 10-18 Units into the skin 3 (three) times daily with meals. Sliding scale  [provider]  Insulin Pen Needle (B-D ULTRAFINE III SHORT PEN) 31G X 8 MM MISC 1 each by Does not apply route as directed. 03/07/15   Cassandria Anger, MD  LANTUS SOLOSTAR 100 UNIT/ML Solostar Pen INJECT 36 UNITS SUBCUTANEOUSLY EVERY MORNING. Patient taking differently: INJECT 48 UNITS SUBCUTANEOUSLY EVERY MORNING. 12/22/15   Cassandria Anger, MD  levothyroxine (SYNTHROID, LEVOTHROID) 125 MCG tablet Take 1 tablet by mouth daily. 08/12/16   [provider]  naproxen (NAPROSYN) 500 MG tablet Take 1 tablet (500 mg total) by mouth 2 (two) times daily with a meal. 04/03/19   Noemi Chapel, MD  ondansetron (ZOFRAN ODT) 4 MG disintegrating tablet 38m ODT q4 hours prn nausea/vomit 09/13/16   ZMilton Ferguson MD  pantoprazole (PROTONIX) 40 MG tablet Take 40 mg by mouth 2 (two) times daily.    [provider]  potassium chloride SA (K-DUR,KLOR-CON) 20 MEQ tablet Take 20 mEq by mouth daily.    [provider]  predniSONE  (STERAPRED UNI-PAK 21 TAB) 10 MG (21) TBPK tablet Take 6 tabs by mouth daily  for 2 days, then 5 tabs for 2 days, then 4 tabs for 2 days, then 3 tabs for 2 days, 2 tabs for 2 days, then 1 tab by mouth daily for 2 days 09/09/16   HIsla Pence MD  PThe [facility] LibertyHFA 108 [phone]) MCG/ACT inhaler Take 1 Inhaler by mouth every 6 (six) hours as needed for wheezing or shortness of breath.  09/07/16   [provider]  traMADol (ULTRAM) 50 MG tablet Take 1 tablet (50 mg total) by mouth every 6 (six) hours as needed. 09/13/16   ZMilton Ferguson MD    Allergies    Wellbutrin [bupropion], Ciprofloxacin hcl, and Tape  Review of Systems   Review of Systems  Musculoskeletal:       Right leg swelling and redness  All other systems reviewed and are negative.   Physical Exam Updated Vital Signs BP (!) 130/79 (BP Location: Right Arm)    Pulse 87    Temp 98.4 F (36.9 C) (Oral)    Resp 18    LMP 11/25/2019    SpO2 100%   Physical Exam Vitals and nursing note reviewed.  Constitutional:      Appearance: Normal appearance.  HENT:     Head: Normocephalic and atraumatic.     Right Ear: External ear normal.     Left Ear: External ear normal.     Nose: Nose normal.     Mouth/Throat:     Mouth: Mucous membranes are moist.     Pharynx: Oropharynx is clear.  Eyes:     Extraocular Movements: Extraocular movements intact.     Conjunctiva/sclera: Conjunctivae normal.     Pupils: Pupils are equal, round, and reactive to light.  Cardiovascular:     Rate and Rhythm: Normal rate and regular rhythm.     Pulses: Normal pulses.     Heart sounds: Normal heart sounds.  Pulmonary:     Effort: Pulmonary effort is normal.     Breath sounds: Normal breath sounds.  Abdominal:     General: Abdomen is flat. Bowel sounds are normal.     Palpations: Abdomen is soft.  Musculoskeletal:     Cervical back: Normal range of motion and neck supple.     Right lower leg: Edema present.     Comments: Redness to RLE  Skin:     General: Skin is warm.     Capillary Refill:  Capillary refill takes less than 2 seconds.  Neurological:     General: No focal deficit present.     Mental Status: She is alert and oriented to person, place, and time.  Psychiatric:        Mood and Affect: Mood normal.        Behavior: Behavior normal.        Thought Content: Thought content normal.        Judgment: Judgment normal.     ED Results / Procedures / Treatments   Labs (all labs ordered are listed, but only abnormal results are displayed) Labs Reviewed  CBG MONITORING, ED    EKG None  Radiology No results found.  Procedures Procedures (including critical care time)  Medications Ordered in ED Medications  cephALEXin (KEFLEX) capsule 500 mg (500 mg Oral Given 11/25/19 1807)  Rivaroxaban (XARELTO) tablet 15 mg (15 mg Oral Given 11/25/19 1806)    ED Course  I have reviewed the triage vital signs and the nursing notes.  Pertinent labs & imaging results that were available during my care of the patient were reviewed by me and considered in my medical decision making (see chart for details).    MDM Rules/Calculators/A&P                          Pt likely has just cellulitis, but the possibility of DVT is there.  [name] is not here now, but she needs an [name] to r/o DVT.  I gave pt 1 dose of Xarelto in ED.  She will return tomorrow at 3:30 pm for an [name].  Pt denies sob or cp, so doubt pe.  Pt is stable for d/c.   Final Clinical Impression(s) / ED Diagnoses Final diagnoses:  Cellulitis of right lower extremity    Rx / DC Orders ED Discharge Orders         Ordered    US Venous Img Lower Unilateral Right     Discontinue     11/25/19 1759    cephALEXin (KEFLEX) 500 MG capsule  4 times daily     Discontinue  Reprint     11/25/19 1759           Isla Pence, MD 11/25/19 1810

## 2019-11-25 NOTE — Discharge Instructions (Addendum)
Return tomorrow at 3:30 pm for an ultrasound of your right leg to rule out a blood clot.

## 2019-11-26 ENCOUNTER — Ambulatory Visit (HOSPITAL_COMMUNITY)
Admission: RE | Admit: 2019-11-26 | Discharge: 2019-11-26 | Disposition: A | Discharge status: Home / Self Care | Payer: BLUE CROSS/BLUE SHIELD | Source: Ambulatory Visit | Attending: Emergency Medicine | Admitting: Emergency Medicine

## 2019-11-26 DIAGNOSIS — M7989 Other specified soft tissue disorders: Secondary | ICD-10-CM | POA: Diagnosis present

## 2019-11-26 IMAGING — US US EXTREM LOW VENOUS*R*
1 series · 13 of 24 positions shown · non-contrast
Comparison: None.

CLINICAL DATA: Right lower extremity swelling and pain



[Series 1: us venous img lower uni right (dvt) · portal-venous · 13 of 37 slices shown]
[im 1/37]
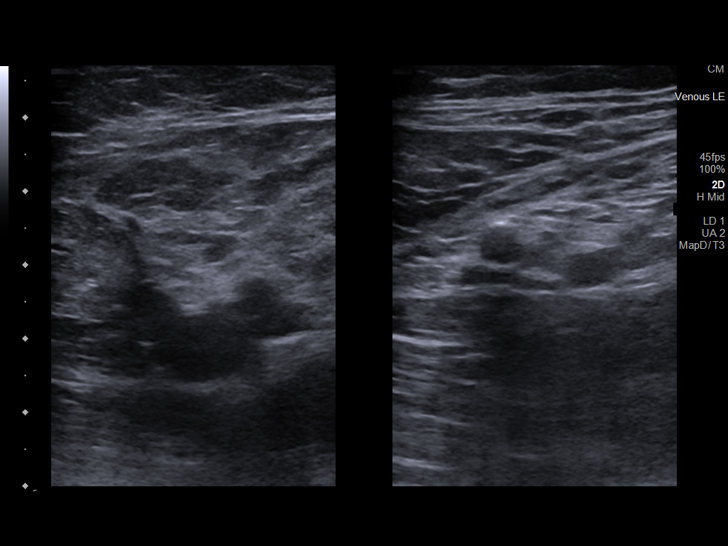
[im 4/37]
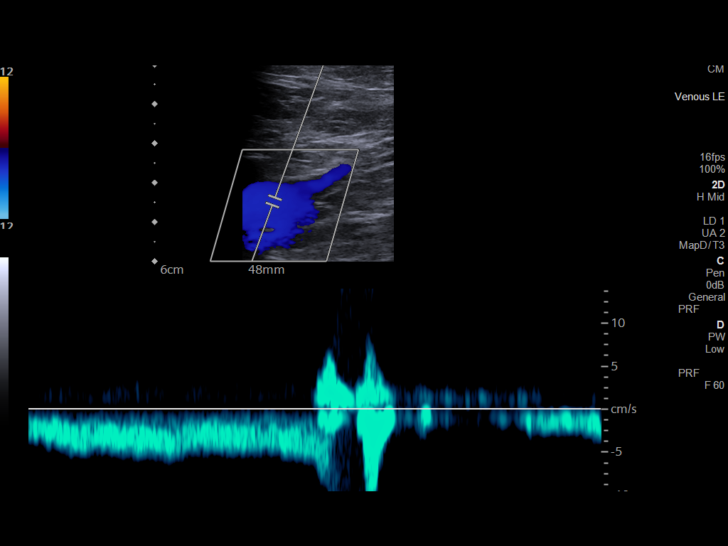
[im 7/37]
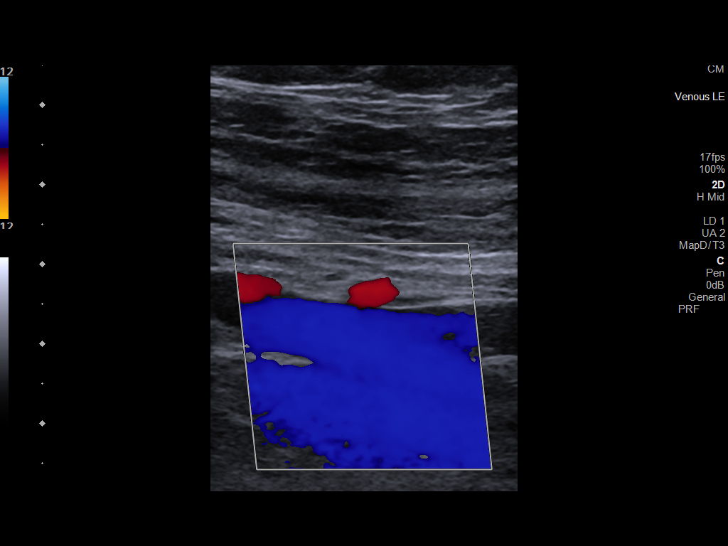
[im 10/37]
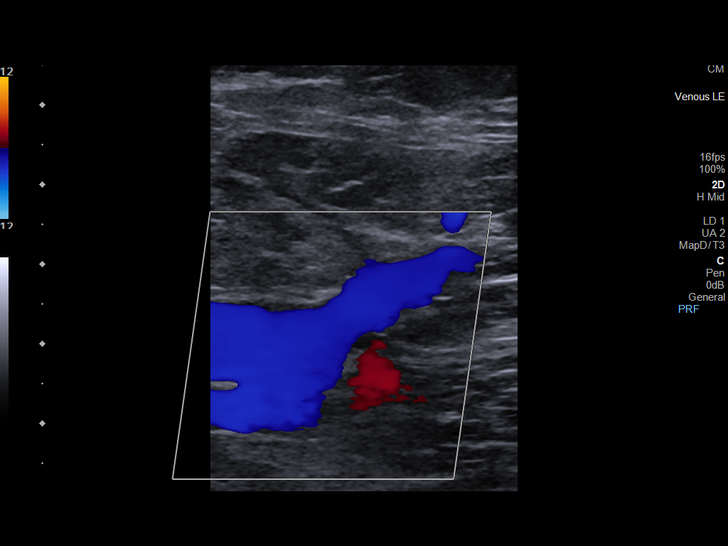
[im 13/37]
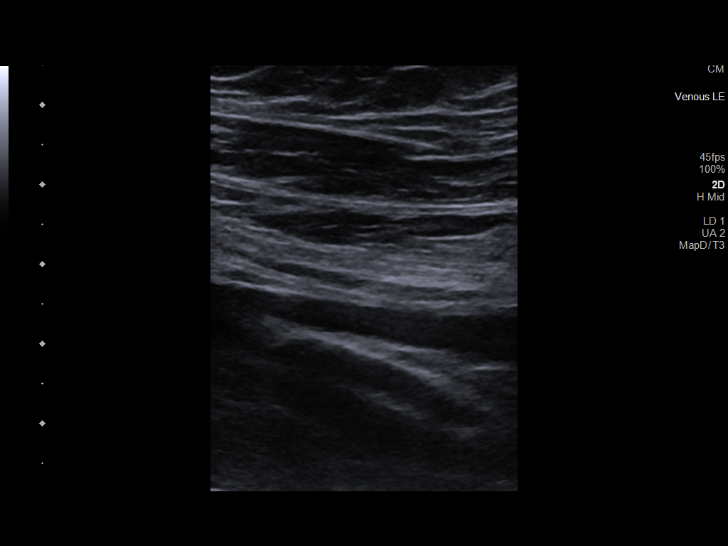
[im 16/37]
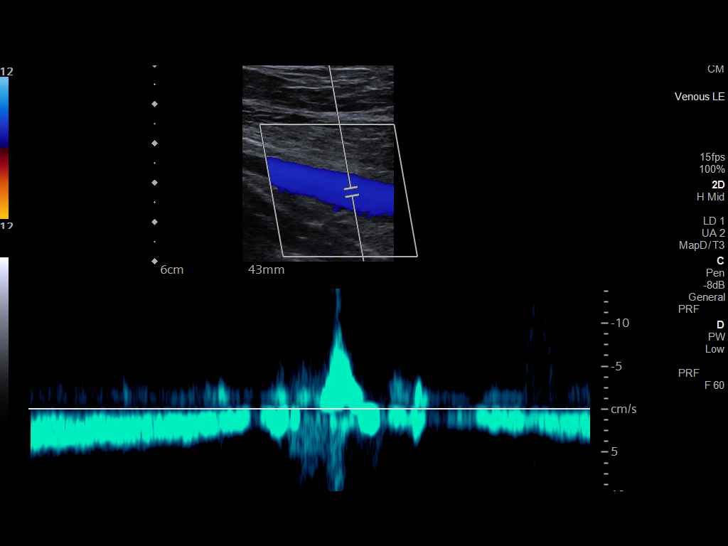
[im 19/37]
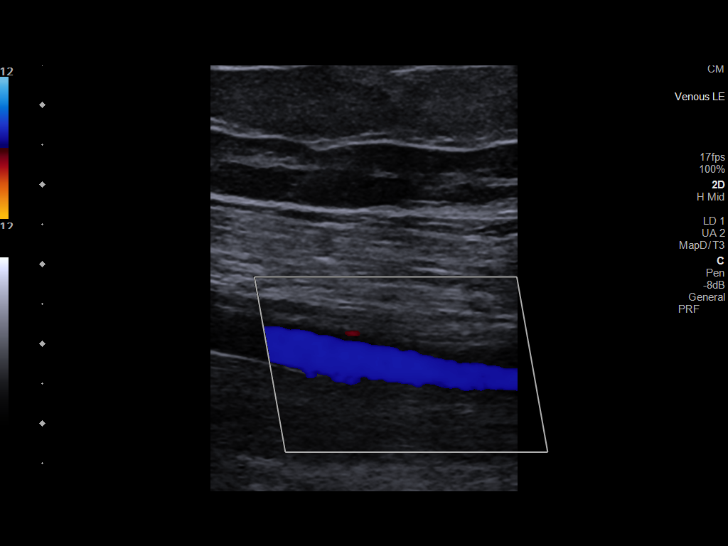
[im 21/37]
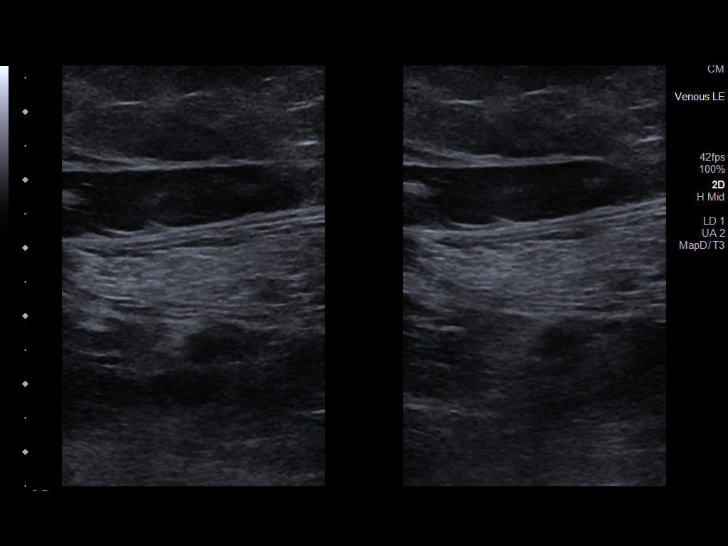
[im 24/37]
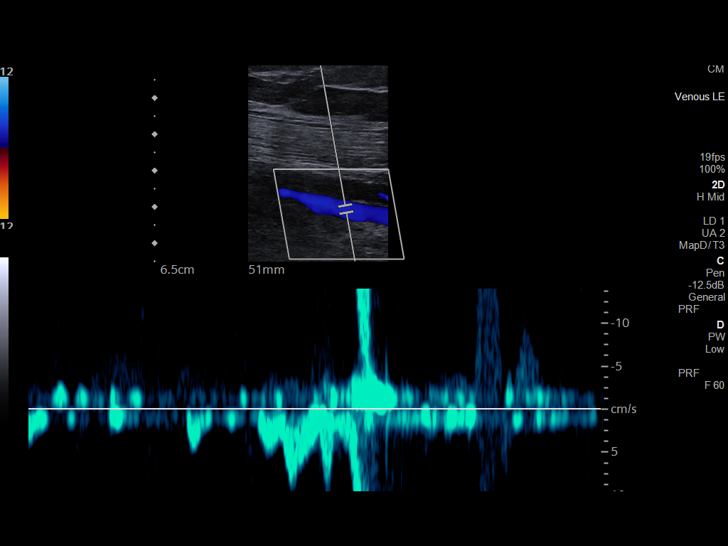
[im 27/37]
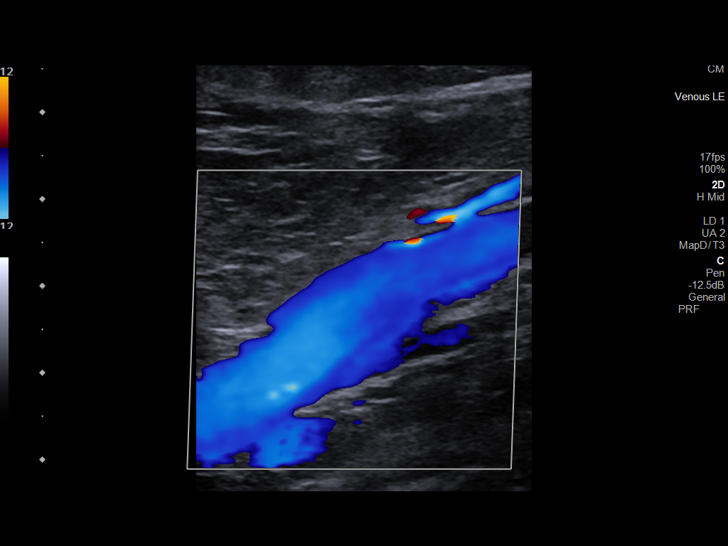
[im 30/37]
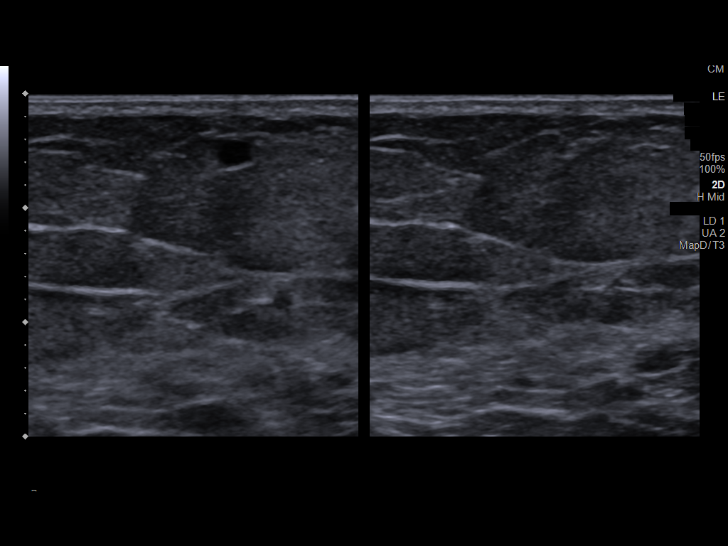
[im 33/37]
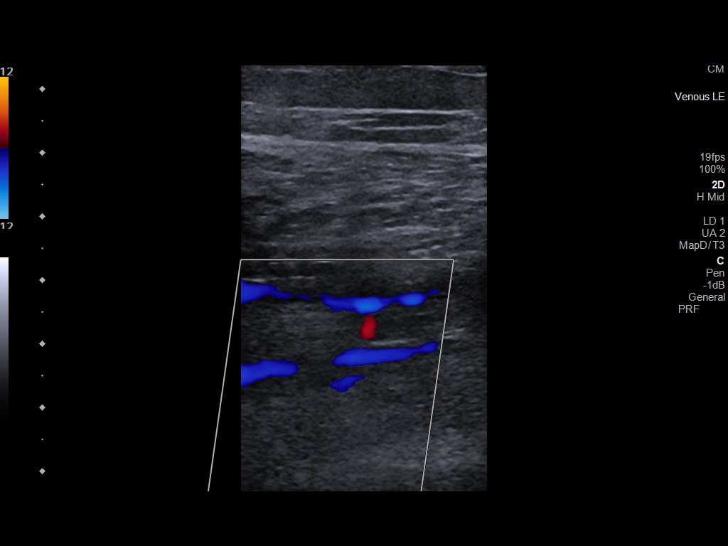
[im 37/37]
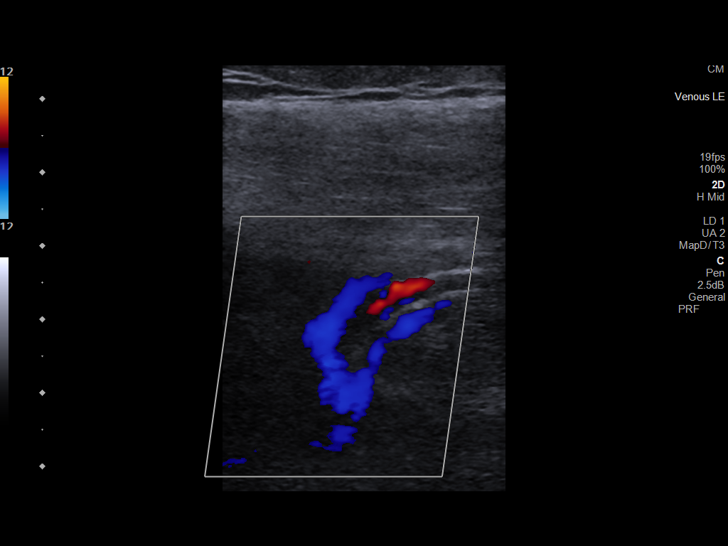

[13 of 24 positions shown; findings below may reference images not displayed]

FINDINGS: Contralateral Common Femoral Vein: Respiratory phasicity is normal
and symmetric with the symptomatic side. No evidence of thrombus.
Normal compressibility.

Common Femoral Vein: No evidence of thrombus. Normal
compressibility, respiratory phasicity and response to augmentation.

Saphenofemoral Junction: No evidence of thrombus. Normal
compressibility and flow on color Doppler imaging.

Profunda Femoral Vein: No evidence of thrombus. Normal
compressibility and flow on color Doppler imaging.

Femoral Vein: No evidence of thrombus. Normal compressibility,
respiratory phasicity and response to augmentation.

Popliteal Vein: No evidence of thrombus. Normal compressibility,
respiratory phasicity and response to augmentation.

Calf Veins: No evidence of thrombus. Normal compressibility and flow
on color Doppler imaging.
IMPRESSION: No evidence of deep venous thrombosis.

## 2019-12-31 ENCOUNTER — Inpatient Hospital Stay (HOSPITAL_COMMUNITY)
Admission: EM | Admit: 2019-12-31 | Discharge: 2020-01-02 | DRG: 065 | Disposition: A | Discharge status: Home / Self Care | Payer: BLUE CROSS/BLUE SHIELD | Attending: Internal Medicine | Admitting: Internal Medicine

## 2019-12-31 ENCOUNTER — Emergency Department (HOSPITAL_COMMUNITY): Payer: BLUE CROSS/BLUE SHIELD

## 2019-12-31 ENCOUNTER — Encounter (HOSPITAL_COMMUNITY): Payer: Self-pay | Admitting: *Deleted

## 2019-12-31 DIAGNOSIS — R0789 Other chest pain: Secondary | ICD-10-CM | POA: Diagnosis present

## 2019-12-31 DIAGNOSIS — I639 Cerebral infarction, unspecified: Secondary | ICD-10-CM

## 2019-12-31 DIAGNOSIS — E785 Hyperlipidemia, unspecified: Secondary | ICD-10-CM | POA: Diagnosis not present

## 2019-12-31 DIAGNOSIS — H538 Other visual disturbances: Secondary | ICD-10-CM | POA: Diagnosis present

## 2019-12-31 DIAGNOSIS — I2510 Atherosclerotic heart disease of native coronary artery without angina pectoris: Secondary | ICD-10-CM | POA: Diagnosis present

## 2019-12-31 DIAGNOSIS — Z7982 Long term (current) use of aspirin: Secondary | ICD-10-CM

## 2019-12-31 DIAGNOSIS — Z881 Allergy status to other antibiotic agents status: Secondary | ICD-10-CM | POA: Diagnosis not present

## 2019-12-31 DIAGNOSIS — Z20822 Contact with and (suspected) exposure to covid-19: Secondary | ICD-10-CM | POA: Diagnosis present

## 2019-12-31 DIAGNOSIS — R000 Tachycardia, unspecified: Secondary | ICD-10-CM | POA: Diagnosis present

## 2019-12-31 DIAGNOSIS — E1022 Type 1 diabetes mellitus with diabetic chronic kidney disease: Secondary | ICD-10-CM | POA: Diagnosis present

## 2019-12-31 DIAGNOSIS — E538 Deficiency of other specified B group vitamins: Secondary | ICD-10-CM | POA: Diagnosis present

## 2019-12-31 DIAGNOSIS — N184 Chronic kidney disease, stage 4 (severe): Secondary | ICD-10-CM

## 2019-12-31 DIAGNOSIS — Z8249 Family history of ischemic heart disease and other diseases of the circulatory system: Secondary | ICD-10-CM

## 2019-12-31 DIAGNOSIS — R29701 NIHSS score 1: Secondary | ICD-10-CM | POA: Diagnosis present

## 2019-12-31 DIAGNOSIS — E782 Mixed hyperlipidemia: Secondary | ICD-10-CM

## 2019-12-31 DIAGNOSIS — R200 Anesthesia of skin: Secondary | ICD-10-CM

## 2019-12-31 DIAGNOSIS — I10 Essential (primary) hypertension: Secondary | ICD-10-CM | POA: Diagnosis not present

## 2019-12-31 DIAGNOSIS — Z8349 Family history of other endocrine, nutritional and metabolic diseases: Secondary | ICD-10-CM | POA: Diagnosis not present

## 2019-12-31 DIAGNOSIS — Z79899 Other long term (current) drug therapy: Secondary | ICD-10-CM

## 2019-12-31 DIAGNOSIS — Z87891 Personal history of nicotine dependence: Secondary | ICD-10-CM | POA: Diagnosis not present

## 2019-12-31 DIAGNOSIS — Z888 Allergy status to other drugs, medicaments and biological substances status: Secondary | ICD-10-CM

## 2019-12-31 DIAGNOSIS — E1065 Type 1 diabetes mellitus with hyperglycemia: Secondary | ICD-10-CM | POA: Diagnosis present

## 2019-12-31 DIAGNOSIS — E039 Hypothyroidism, unspecified: Secondary | ICD-10-CM | POA: Diagnosis present

## 2019-12-31 DIAGNOSIS — R079 Chest pain, unspecified: Secondary | ICD-10-CM | POA: Diagnosis not present

## 2019-12-31 DIAGNOSIS — E1059 Type 1 diabetes mellitus with other circulatory complications: Secondary | ICD-10-CM

## 2019-12-31 DIAGNOSIS — Z6839 Body mass index (BMI) 39.0-39.9, adult: Secondary | ICD-10-CM

## 2019-12-31 DIAGNOSIS — Z794 Long term (current) use of insulin: Secondary | ICD-10-CM

## 2019-12-31 DIAGNOSIS — N179 Acute kidney failure, unspecified: Secondary | ICD-10-CM | POA: Diagnosis present

## 2019-12-31 DIAGNOSIS — N170 Acute kidney failure with tubular necrosis: Secondary | ICD-10-CM | POA: Diagnosis not present

## 2019-12-31 DIAGNOSIS — I6389 Other cerebral infarction: Secondary | ICD-10-CM | POA: Diagnosis not present

## 2019-12-31 DIAGNOSIS — E6609 Other obesity due to excess calories: Secondary | ICD-10-CM | POA: Diagnosis present

## 2019-12-31 DIAGNOSIS — R072 Precordial pain: Secondary | ICD-10-CM | POA: Diagnosis not present

## 2019-12-31 DIAGNOSIS — Z823 Family history of stroke: Secondary | ICD-10-CM

## 2019-12-31 DIAGNOSIS — Z720 Tobacco use: Secondary | ICD-10-CM | POA: Diagnosis not present

## 2019-12-31 DIAGNOSIS — N1831 Chronic kidney disease, stage 3a: Secondary | ICD-10-CM | POA: Diagnosis present

## 2019-12-31 DIAGNOSIS — Z83438 Family history of other disorder of lipoprotein metabolism and other lipidemia: Secondary | ICD-10-CM | POA: Diagnosis not present

## 2019-12-31 DIAGNOSIS — E109 Type 1 diabetes mellitus without complications: Secondary | ICD-10-CM | POA: Diagnosis not present

## 2019-12-31 DIAGNOSIS — Z9109 Other allergy status, other than to drugs and biological substances: Secondary | ICD-10-CM

## 2019-12-31 DIAGNOSIS — N183 Chronic kidney disease, stage 3 unspecified: Secondary | ICD-10-CM | POA: Diagnosis not present

## 2019-12-31 DIAGNOSIS — I252 Old myocardial infarction: Secondary | ICD-10-CM

## 2019-12-31 DIAGNOSIS — Z992 Dependence on renal dialysis: Secondary | ICD-10-CM

## 2019-12-31 DIAGNOSIS — Z79890 Hormone replacement therapy (postmenopausal): Secondary | ICD-10-CM

## 2019-12-31 DIAGNOSIS — R778 Other specified abnormalities of plasma proteins: Secondary | ICD-10-CM | POA: Diagnosis not present

## 2019-12-31 HISTORY — DX: Cerebral infarction, unspecified: I63.9

## 2019-12-31 LAB — BASIC METABOLIC PANEL
Anion gap: 9 (ref 5–15)
BUN: 27 mg/dL — ABNORMAL HIGH (ref 6–20)
CO2: 23 mmol/L (ref 22–32)
Calcium: 8.5 mg/dL — ABNORMAL LOW (ref 8.9–10.3)
Chloride: 107 mmol/L (ref 98–111)
Creatinine, Ser: 1.8 mg/dL — ABNORMAL HIGH (ref 0.44–1.00)
GFR calc Af Amer: 41 mL/min — ABNORMAL LOW (ref >60)
GFR calc non Af Amer: 35 mL/min — ABNORMAL LOW (ref >60)
Glucose, Bld: 168 mg/dL — ABNORMAL HIGH (ref 70–99)
Potassium: 3.9 mmol/L (ref 3.5–5.1)
Sodium: 139 mmol/L (ref 135–145)

## 2019-12-31 LAB — SARS CORONAVIRUS 2 BY RT PCR (HOSPITAL ORDER, PERFORMED IN ~~LOC~~ HOSPITAL LAB): SARS Coronavirus 2: NEGATIVE

## 2019-12-31 LAB — CBC
HCT: 39.6 % (ref 36.0–46.0)
Hemoglobin: 12.7 g/dL (ref 12.0–15.0)
MCH: 28.9 pg (ref 26.0–34.0)
MCHC: 32.1 g/dL (ref 30.0–36.0)
MCV: 90 fL (ref 80.0–100.0)
Platelets: 439 10*3/uL — ABNORMAL HIGH (ref 150–400)
RBC: 4.4 MIL/uL (ref 3.87–5.11)
RDW: 13.2 % (ref 11.5–15.5)
WBC: 14.9 10*3/uL — ABNORMAL HIGH (ref 4.0–10.5)
nRBC: 0 % (ref 0.0–0.2)

## 2019-12-31 LAB — TROPONIN I (HIGH SENSITIVITY)
Troponin I (High Sensitivity): 413 ng/L (ref <18)
Troponin I (High Sensitivity): 367 ng/L (ref <18)

## 2019-12-31 LAB — POC URINE PREG, ED: Preg Test, Ur: NEGATIVE

## 2019-12-31 IMAGING — MR MR HEAD W/O CM
17 of 20 series · 29 of 48 positions shown · non-contrast
Comparison: None.

CLINICAL DATA: Neuro deficit

EXAM:
MRI HEAD WITHOUT CONTRAST
MRA HEAD WITHOUT CONTRAST
TECHNIQUE: Multiplanar, multiecho pulse sequences of the brain and surrounding
structures were obtained without intravenous contrast. Angiographic
images of the head were obtained using MRA technique without
contrast.

[Series 5: DWI · axial · 4.0mm · 0.88mm/px · z∈[-140,-5]mm · 2 of 36 slices shown (1 of 6)]
[im 1/36]
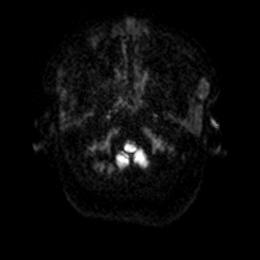
[im 36/36]
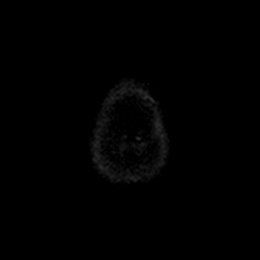

[Series 5: DWI · axial · 4.0mm · 0.88mm/px · z∈[-140,-5]mm · 2 of 36 slices shown (2 of 6)]
[im 1/36]
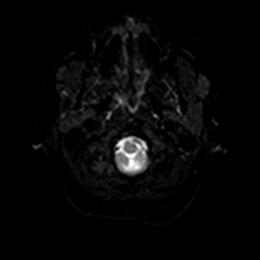
[im 36/36]
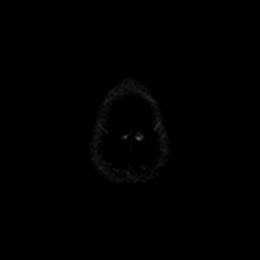

[Series 6: DWI · axial · 4.0mm · 0.88mm/px · z∈[-140,-5]mm · 2 of 36 slices shown (3 of 6)]
[im 1/36]
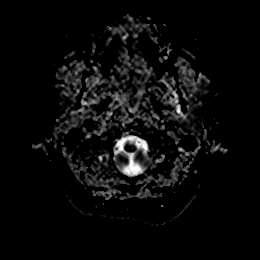
[im 36/36]
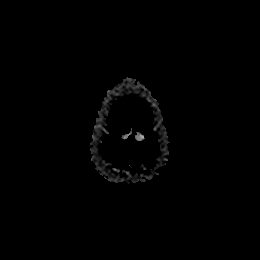

[Series 7: DWI · coronal · 4.0mm · 0.88mm/px · 2 of 32 slices shown (4 of 6)]
[im 1/32]
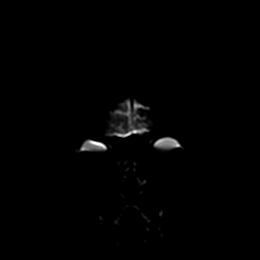
[im 32/32]
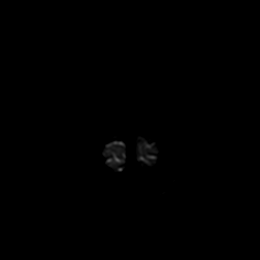

[Series 7: DWI · coronal · 4.0mm · 0.88mm/px · 2 of 32 slices shown (5 of 6)]
[im 1/32]
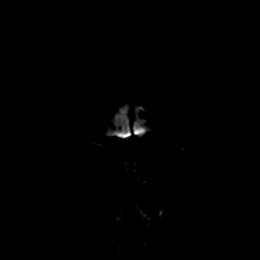
[im 32/32]
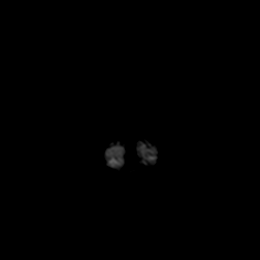

[Series 8: DWI · coronal · 4.0mm · 0.88mm/px · 2 of 32 slices shown (6 of 6)]
[im 1/32]
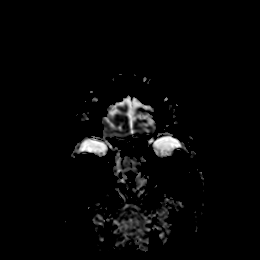
[im 32/32]
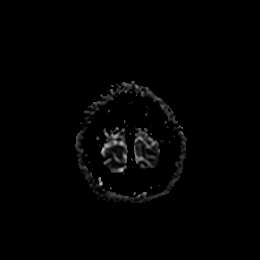

[Series 9: T1 · sagittal · 5.0mm · 0.94mm/px · 2 of 25 slices shown]
[im 1/25]
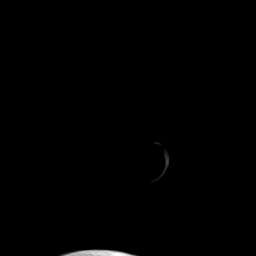
[im 25/25]
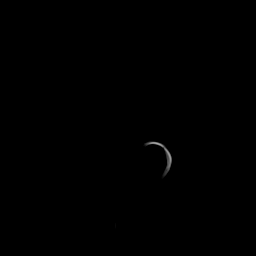

[Series 15: T2 · axial · 5.0mm · 0.72mm/px · 1 of 20 slices shown (1 of 2)]
[im 1/20]
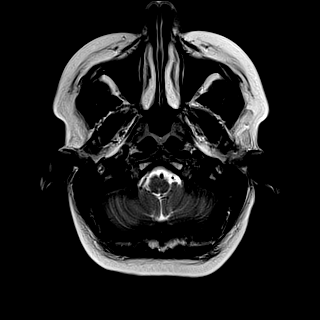

[Series 16: ax hemo · axial · 5.0mm · 0.86mm/px · z∈[-138,+1]mm · 2 of 25 slices shown]
[im 1/25]
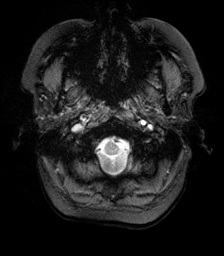
[im 25/25]
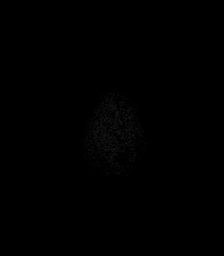

[Series 17: FLAIR · axial · 4.0mm · 0.43mm/px · z∈[-140,-3]mm · 3 of 37 slices shown (1 of 2)]
[im 1/37]
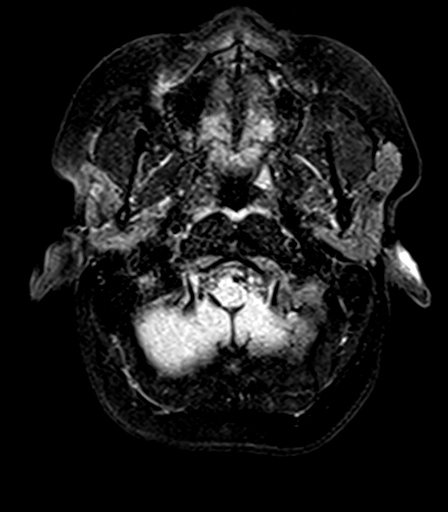
[im 19/37]
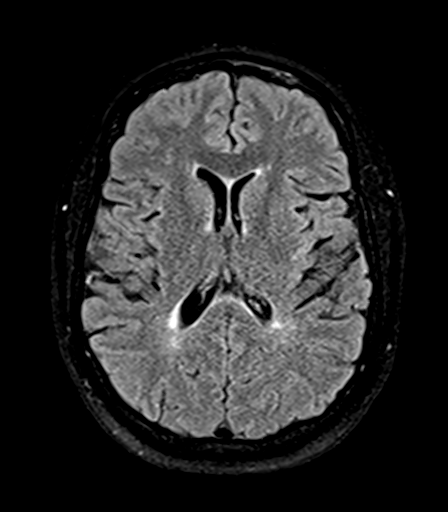
[im 37/37]
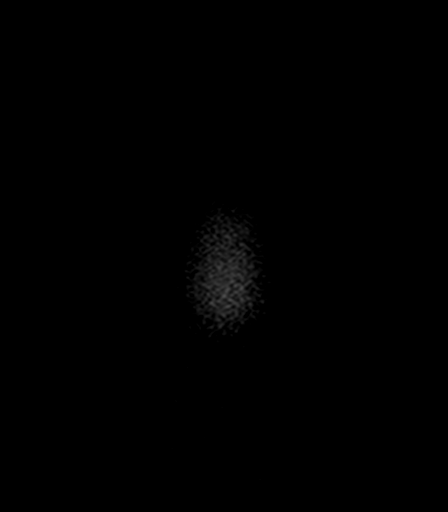

[Series 19: T2 · coronal · 5.0mm · 0.72mm/px · 2 of 28 slices shown (2 of 2)]
[im 1/28]
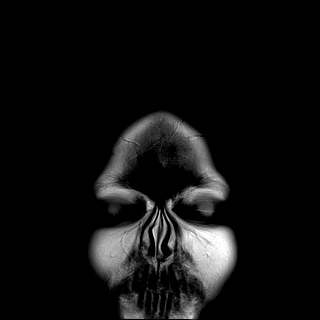
[im 28/28]
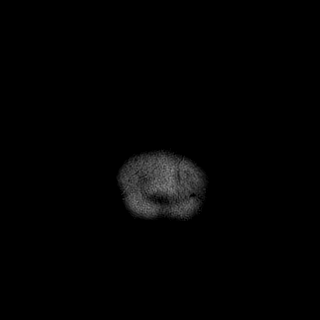

[Series 20: FLAIR · sagittal · 5.0mm · 0.94mm/px · 2 of 25 slices shown (2 of 2)]
[im 1/25]
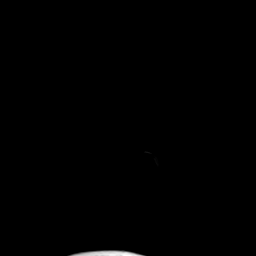
[im 25/25]
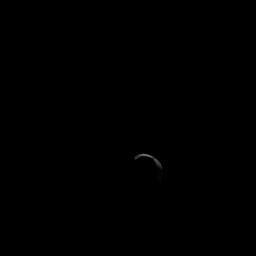

[Series 1022: tumble · 0.34mm/px · 1 of 8 slices shown (1 of 2)]
[im 1/8]
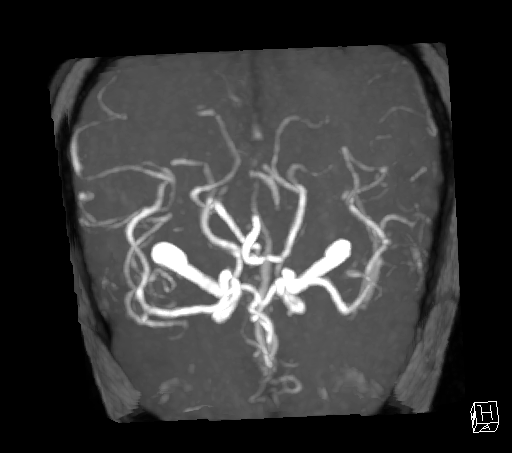

[Series 1026: rotate · 0.34mm/px · 1 of 7 slices shown (1 of 3)]
[im 1/7]
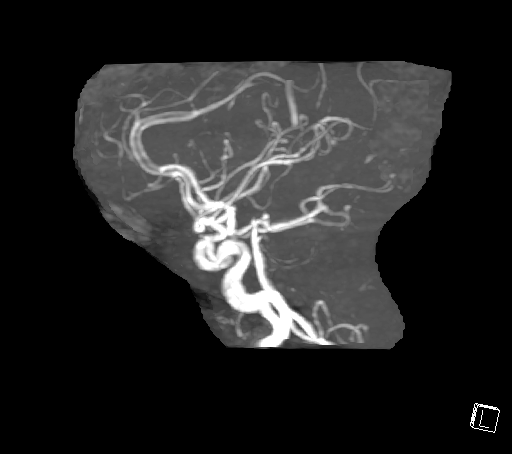

[Series 1035: rotate · 0.34mm/px · 1 of 13 slices shown (2 of 3)]
[im 1/13]
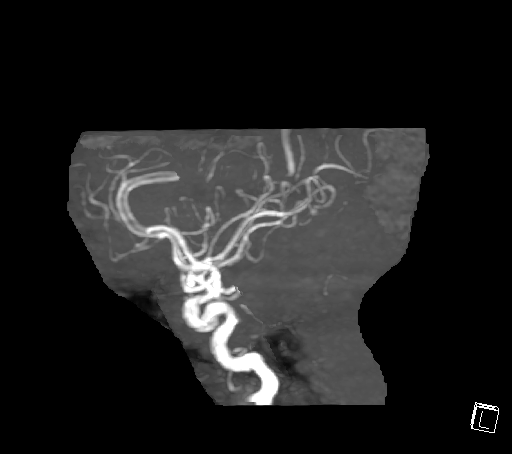

[Series 1039: tumble · 0.34mm/px · 1 of 19 slices shown (2 of 2)]
[im 1/19]
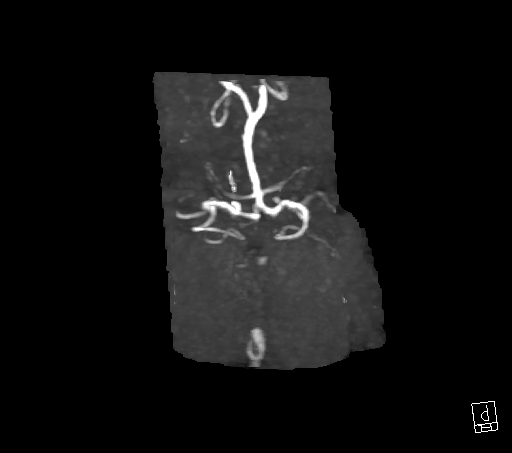

[Series 1043: rotate · 0.34mm/px · 1 of 19 slices shown (3 of 3)]
[im 1/19]
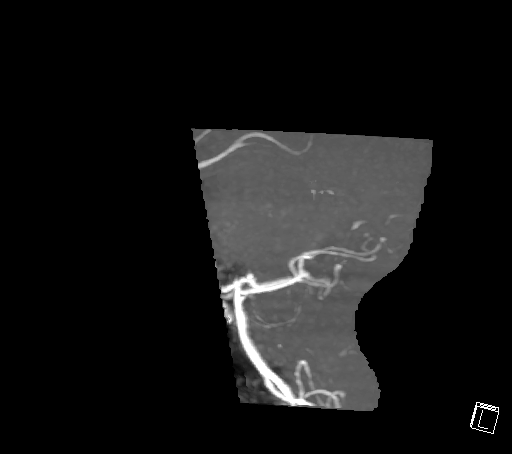

[29 of 48 positions shown; findings below may reference images not displayed]

FINDINGS: Some image sequences are degraded by motion artifact.

MRI HEAD FINDINGS

Brain: Focal restricted diffusion involving the right mid brain
measuring 8 mm ([DATE]) in AP dimension. Scattered and confluent
periventricular and subcortical white matter T2/FLAIR hyperintense
foci. No midline shift, ventriculomegaly or extra-axial fluid
collection. No mass lesion.

Vascular: Please see MRA head.

Skull and upper cervical spine: Normal marrow signal.

Sinuses/Orbits: Normal orbits. Clear paranasal sinuses. No mastoid
effusion.

Other: None.

MRA HEAD FINDINGS

Please note that time-of-flight imaging is degraded by motion
artifact, limiting evaluation.

Anterior circulation: Moderate left and mild right supraclinoid ICA
narrowing. Irregularity of the left greater than right
intracavernous ICAs may reflect atheromatous disease versus motion
artifact. No significant proximal occlusion, aneurysm, or vascular
malformation. The anterior and middle cerebral arteries are patent.

Posterior circulation: Dominant left vertebral artery. No
significant stenosis, proximal occlusion, aneurysm, or vascular
malformation. Patent bilateral V4 segments and basilar artery.
Patent bilateral PCAs. The proximal bilateral superior cerebellar
arteries are patent, not well visualized distally secondary to
motion artifact.

Venous sinuses: No evidence of thrombosis.

Anatomic variants: Left PCOM hypoplasia. Fetal origin of the right
PCA.
IMPRESSION: Acute right midbrain infarct measuring 8 mm.

Supratentorial white matter FLAIR hyperintensities. Differential
includes demyelination versus chronic microvascular ischemic
changes.

Motion artifact limits evaluation on time-of-flight. Moderate left
and mild right supraclinoid ICA narrowing.

No large vessel occlusion.

These results were called by telephone at the time of interpretation
on [DATE] at [DATE] to provider QUIRIJN , who verbally
acknowledged these results.

## 2019-12-31 MED ORDER — INSULIN ASPART 100 UNIT/ML ~~LOC~~ SOLN
0.0000 [IU] | Freq: Three times a day (TID) | SUBCUTANEOUS | Status: DC
  Administered 2020-01-01: 11 [IU] via SUBCUTANEOUS
  Administered 2020-01-01: 5 [IU] via SUBCUTANEOUS
  Administered 2020-01-02: 11 [IU] via SUBCUTANEOUS
  Administered 2020-01-02: 5 [IU] via SUBCUTANEOUS
  Filled 2019-12-31 (×2): qty 1

## 2019-12-31 MED ORDER — ACETAMINOPHEN 160 MG/5ML PO SOLN: 650.0000 mg | ORAL | Status: DC | PRN

## 2019-12-31 MED ORDER — PANTOPRAZOLE SODIUM 40 MG PO TBEC: 40.0000 mg | DELAYED_RELEASE_TABLET | Freq: Every day | ORAL | Status: DC | PRN

## 2019-12-31 MED ORDER — NITROGLYCERIN 2 % TD OINT
1.0000 [in_us] | TOPICAL_OINTMENT | Freq: Once | TRANSDERMAL | Status: AC
  Administered 2019-12-31: 1 [in_us] via TOPICAL
  Filled 2019-12-31: qty 1

## 2019-12-31 MED ORDER — ASPIRIN 81 MG PO CHEW: 324.0000 mg | CHEWABLE_TABLET | Freq: Once | ORAL | Status: DC

## 2019-12-31 MED ORDER — LEVOTHYROXINE SODIUM 75 MCG PO TABS
175.0000 ug | ORAL_TABLET | Freq: Every day | ORAL | Status: DC
  Administered 2020-01-01 – 2020-01-02 (×2): 175 ug via ORAL
  Filled 2019-12-31: qty 4
  Filled 2019-12-31: qty 1

## 2019-12-31 MED ORDER — STROKE: EARLY STAGES OF RECOVERY BOOK
Freq: Once | Status: AC
  Filled 2019-12-31: qty 1

## 2019-12-31 MED ORDER — ACETAMINOPHEN 650 MG RE SUPP: 650.0000 mg | RECTAL | Status: DC | PRN

## 2019-12-31 MED ORDER — SODIUM CHLORIDE 0.9 % IV SOLN: INTRAVENOUS | Status: DC

## 2019-12-31 MED ORDER — INSULIN ASPART 100 UNIT/ML ~~LOC~~ SOLN
0.0000 [IU] | Freq: Every day | SUBCUTANEOUS | Status: DC
  Administered 2020-01-01: 2 [IU] via SUBCUTANEOUS

## 2019-12-31 MED ORDER — ENOXAPARIN SODIUM 40 MG/0.4ML ~~LOC~~ SOLN
40.0000 mg | SUBCUTANEOUS | Status: DC
  Administered 2019-12-31 – 2020-01-01 (×2): 40 mg via SUBCUTANEOUS
  Filled 2019-12-31 (×2): qty 0.4

## 2019-12-31 MED ORDER — SODIUM CHLORIDE 0.9 % IV SOLN: INTRAVENOUS | Status: AC

## 2019-12-31 MED ORDER — INSULIN GLARGINE 100 UNIT/ML ~~LOC~~ SOLN
20.0000 [IU] | Freq: Two times a day (BID) | SUBCUTANEOUS | Status: DC
  Filled 2019-12-31 (×4): qty 0.2

## 2019-12-31 MED ORDER — ACETAMINOPHEN 325 MG PO TABS
650.0000 mg | ORAL_TABLET | ORAL | Status: DC | PRN
  Administered 2020-01-01: 650 mg via ORAL
  Filled 2019-12-31: qty 2

## 2019-12-31 MED ORDER — ASPIRIN 81 MG PO CHEW
324.0000 mg | CHEWABLE_TABLET | Freq: Once | ORAL | Status: AC
  Administered 2020-01-01: 324 mg via ORAL
  Filled 2019-12-31 (×2): qty 4

## 2019-12-31 MED ORDER — SENNOSIDES-DOCUSATE SODIUM 8.6-50 MG PO TABS
1.0000 | ORAL_TABLET | Freq: Every evening | ORAL | Status: DC | PRN
  Filled 2019-12-31: qty 1

## 2019-12-31 NOTE — ED Notes (Signed)
Date and time results received: 12/31/19    Test: Troponin Critical Value: 413  Name of Provider Notified: Dr. Roderic Palau  Orders Received? Or Actions Taken?: No new orders given.

## 2019-12-31 NOTE — ED Notes (Signed)
Pt husband at bedside. Pt in MRI at this time.

## 2019-12-31 NOTE — ED Notes (Signed)
Pt back in room from MRI.

## 2019-12-31 NOTE — ED Triage Notes (Signed)
Chest pain onset today, numbness in extremities onset yesterday

## 2019-12-31 NOTE — ED Notes (Signed)
EDP at bedside  

## 2019-12-31 NOTE — H&P (Addendum)
[facility] History & Physical  Kelly Splinter MD  [place] 12/31/2019  Chief Complaint: R hand weakness HPI: The patient is a 39 y.o. year-old w/ hx of MI, DM2, CKD 3 presents w/ numbness of the L face and arm yesterday. Today had some numbness to the L leg and some chest discomfort. Came to ED where MRI showed an acute CVA of the midbrain. MRA was negative for large vessel occlusion. Also trop is 410.  Asked to see for admission.  Given asa and ntg for ^troponin.   Pt states her whole face is numb, parts of the left arm and leg are numb but the face is worse.  No swallowing issue, just ate a piece of chicken. No loss of motor strength.  Earlier today also had episode of chest tightness after "walking down to the shop", describing as a heaviness on her chest, like a "brick setting on my chest", she vomited and had to sit down.  Chest tightness has resolved now.  No hx CP recently.  EKG here shows no acute, trop is 410.   Pt denies hx of CVA.  Pt lives at home w/ her husband.       Admits  2010 - R pyelonephritis, uncont DM1  2009 - baby delivered, [place], UTI  2006 - DKA, UTI  mult prior admits related to N/V, DM, UTI's, etc    Baseline creat 1.19- 1.30 from 2013, eGFR 59.   ROS  no joint pain   no HA  no blurry vision  no rash  no diarrhea  no dysuria  no difficulty voiding  no change in urine color   Past Medical History  Past Medical History:  Diagnosis Date  . Diabetes mellitus without complication ([place])   . Myocardial infarction ([place])   . Renal disorder    stage 3 kidney disease   Past Surgical History  Past Surgical History:  Procedure Laterality Date  . Cardiac catherization    . CHOLECYSTECTOMY    . TUBAL LIGATION     Family History  Family History  Problem Relation Age of Onset  . Thyroid disease Mother   . Hypertension Mother   . Hyperlipidemia Mother   . Hyperlipidemia Father   . Hypertension Father    Social History  reports  that she has quit smoking. Her smoking use included cigarettes. She smoked 1.00 pack per day. She has never used smokeless tobacco. She reports that she does not drink alcohol and does not use drugs. Allergies  Allergies  Allergen Reactions  . Wellbutrin [Bupropion] Hives  . Ciprofloxacin Hcl Hives and Rash    Hives/rash at injection site   . Tape Rash   Home medications Prior to Admission medications   Medication Sig Start Date End Date Taking? Authorizing Provider  aspirin 81 MG chewable tablet Chew 324 mg by mouth once.   Yes [provider]  furosemide (LASIX) 40 MG tablet Take 40 mg by mouth daily as needed for fluid.    Yes [provider]  glucagon (GLUCAGON EMERGENCY) 1 MG injection Inject 1 mg into the vein once as needed. 04/10/15  Yes Nida, Marella Chimes, MD  insulin lispro (HUMALOG) 100 UNIT/ML injection Inject 5-10 Units into the skin 3 (three) times daily before meals.   Yes [provider]  LANTUS SOLOSTAR 100 UNIT/ML Solostar Pen INJECT 36 UNITS SUBCUTANEOUSLY EVERY MORNING. Patient taking differently: Inject 62 Units into the skin in the morning.  12/22/15  Yes Nida,  Marella Chimes, MD  levothyroxine (SYNTHROID) 175 MCG tablet Take 175 mcg by mouth daily before breakfast.  08/12/16  Yes [provider]  pantoprazole (PROTONIX) 40 MG tablet Take 40 mg by mouth daily as needed (for acid reflux).    Yes [provider]  potassium chloride SA (K-DUR,KLOR-CON) 20 MEQ tablet Take 20 mEq by mouth daily as needed (When taking Lasix (Furosemide)).    Yes [provider]  cephALEXin (KEFLEX) 500 MG capsule Take 1 capsule (500 mg total) by mouth 4 (four) times daily. Patient not taking: Reported on 12/31/2019 11/25/19   Isla Pence, MD       Exam Gen alert, obese WF , no distress, was asleep, pleasant No rash, cyanosis or gangrene Sclera anicteric, throat clear  No jvd or bruits Chest clear bilat to bases no rales or  wheezing RRR no MRG Abd soft ntnd no mass or ascites +bs GU defer MS no joint effusions or deformity Ext no LE or UE edema, no wounds or ulcers Neuro is alert, Ox 3 , nf No motor deficits x 4 ext No CN deficits except for reduced pinprick on L face Mild dec'd sensation on L arm and leg     Home meds:  - asa 81/ lasix 40 prn/ kdur 20 prn  - lantus 62u qam  - synthroid 175 ug am/ protonix 40 qd     BP 154/95  HR 113  RR 19- 24   SpO2 100% ra  Temp 98.4     Na 139 K 3.9 CO2 23  BN 27  Cr 1.80  eGFR 35  Ca 8.5  Trop 413  WBC 14  Hb 12  plt 439    Glu 168      Baseline creat 1.19- 1.30 from 2013, eGFR 59.     CXR - IMPRESSION: No active disease.      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 Supratentorial white matter FLAIR hyperintensities. Differential includes demyelination versus chronic microvascular ischemic changes. Motion artifact limits evaluation on time-of-flight. Moderate left and mild right supraclinoid ICA narrowing. No large vessel occlusion.  Assessment/ Plan: 1. L arm/ face/ leg numbness - w/ acute CVA by MRI of the R midbrain. Will admit, neuro consult for tomorrow. Did not get TPA.  MRA no large vessel occlusion.  2. Chest pain episode/ [place] - hx of MI 5 yrs ago , not here. On ASA at home. Had sig CP episode earlier today. EKG here old ant-lat infarct w/ Q waves, no acute changes. Get f/u troponin. No CP now. Consider cardiology input tomorrow.  3. DM type 1 - age 17 onset, takes Lantus 62 q day and SSI 0-10 tid. Will use bid 20u Lantus here for now and SSI.  4. Hypothyroid - cont synthroid 5. AoCKD 3- creat 1.1- 1.3 baseline but this is back in 2013. Creat 1.8 here.   Give IVF"s overnight and recheck in am.       Kelly Splinter  MD 12/31/2019, 8:02 PM

## 2019-12-31 NOTE — ED Provider Notes (Signed)
[name] EMERGENCY DEPARTMENT Provider Note   CSN: [id number] Arrival date & time: 12/31/19  1207     History Chief Complaint  Patient presents with  . Chest Pain  . Numbness    Lauren [name] is a 39 y.o. female.  Patient states that yesterday she awoke with numbness in her left face and left arm.  Today she had some numbness to left leg and she also complains of occasional chest tightness.  The history is provided by the patient and medical records. No language interpreter was used.  Chest Pain Pain location:  L chest Pain quality: aching   Pain radiates to:  Does not radiate Pain severity:  Mild Onset quality:  Sudden Timing:  Intermittent Progression:  Waxing and waning Chronicity:  New Context: not breathing   Relieved by:  Nothing Worsened by:  Nothing Ineffective treatments:  None tried Associated symptoms: no abdominal pain, no back pain, no cough, no fatigue and no headache        Past Medical History:  Diagnosis Date  . Diabetes mellitus without complication ([place])   . Myocardial infarction ([place])   . Renal disorder    stage 3 kidney disease    Patient Active Problem List   Diagnosis Date Noted  . Vitamin D deficiency 07/24/2015  . Essential hypertension, benign 03/17/2015  . 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 Primary hypothyroidism 03/07/2015    Past Surgical History:  Procedure Laterality Date  . Cardiac catherization    . CHOLECYSTECTOMY    . TUBAL LIGATION       OB History    Gravida  2   Para  2   Term  2   Preterm      AB      Living  2     SAB      TAB      Ectopic      Multiple      Live Births              Family History  Problem Relation Age of Onset  . Thyroid disease Mother   . Hypertension Mother   . Hyperlipidemia Mother   . Hyperlipidemia Father   . Hypertension Father     Social History   Tobacco Use  . Smoking status: Former Smoker    Packs/day:  1.00    Types: Cigarettes  . Smokeless tobacco: Never Used  Vaping Use  . Vaping Use: Never used  Substance Use Topics  . Alcohol use: No    Alcohol/week: 0.0 standard drinks  . Drug use: No    Home Medications Prior to Admission medications   Medication Sig Start Date End Date Taking? Authorizing Provider  aspirin 81 MG chewable tablet Chew 324 mg by mouth once.   Yes [provider]  furosemide (LASIX) 40 MG tablet Take 40 mg by mouth daily as needed for fluid.    Yes [provider]  glucagon (GLUCAGON EMERGENCY) 1 MG injection Inject 1 mg into the vein once as needed. 04/10/15  Yes Nida, Marella Chimes, MD  insulin lispro (HUMALOG) 100 UNIT/ML injection Inject 5-10 Units into the skin 3 (three) times daily before meals.   Yes [provider]  LANTUS SOLOSTAR 100 UNIT/ML Solostar Pen INJECT 36 UNITS SUBCUTANEOUSLY EVERY MORNING. Patient taking differently: Inject 62 Units into the skin in the morning.  12/22/15  Yes Nida, Marella Chimes, MD  levothyroxine (SYNTHROID) 175 MCG tablet Take 175  mcg by mouth daily before breakfast.  08/12/16  Yes [provider]  pantoprazole (PROTONIX) 40 MG tablet Take 40 mg by mouth daily as needed (for acid reflux).    Yes [provider]  potassium chloride SA (K-DUR,KLOR-CON) 20 MEQ tablet Take 20 mEq by mouth daily as needed (When taking Lasix (Furosemide)).    Yes [provider]  cephALEXin (KEFLEX) 500 MG capsule Take 1 capsule (500 mg total) by mouth 4 (four) times daily. Patient not taking: Reported on 12/31/2019 11/25/19   Isla Pence, MD    Allergies    Wellbutrin [bupropion], Ciprofloxacin hcl, and Tape  Review of Systems   Review of Systems  Constitutional: Negative for appetite change and fatigue.  HENT: Negative for congestion, ear discharge and sinus pressure.   Eyes: Negative for discharge.  Respiratory: Negative for cough.   Cardiovascular: Positive for chest pain.    Gastrointestinal: Negative for abdominal pain and diarrhea.  Genitourinary: Negative for frequency and hematuria.  Musculoskeletal: Negative for back pain.  Skin: Negative for rash.  Neurological: Negative for seizures and headaches.       Facial numbness left arm numbness left leg numbness  Psychiatric/Behavioral: Negative for hallucinations.    Physical Exam Updated Vital Signs BP (!) 154/95   Pulse (!) 114   Temp 98.4 F (36.9 C)   Resp (!) 24   Ht 5\' 5"  (1.651 m)   LMP 12/28/2019   SpO2 100%   BMI 38.27 kg/m   Physical Exam Vitals reviewed.  Constitutional:      Appearance: She is well-developed.  HENT:     Head: Normocephalic.     Nose: Nose normal.  Eyes:     General: No scleral icterus.    Conjunctiva/sclera: Conjunctivae normal.  Neck:     Thyroid: No thyromegaly.  Cardiovascular:     Rate and Rhythm: Normal rate and regular rhythm.     Heart sounds: No murmur heard.  No friction rub. No gallop.   Pulmonary:     Breath sounds: No stridor. No wheezing or rales.  Chest:     Chest wall: No tenderness.  Abdominal:     General: There is no distension.     Tenderness: There is no abdominal tenderness. There is no rebound.  Musculoskeletal:        General: Normal range of motion.     Cervical back: Neck supple.  Lymphadenopathy:     Cervical: No cervical adenopathy.  Skin:    Findings: No erythema or rash.  Neurological:     Mental Status: She is alert and oriented to person, place, and time.     Motor: No abnormal muscle tone.     Coordination: Coordination normal.     Comments: Mild numbness to left face left arm left leg  Psychiatric:        Behavior: Behavior normal.     ED Results / Procedures / Treatments   Labs (all labs ordered are listed, but only abnormal results are displayed) Labs Reviewed  BASIC METABOLIC PANEL - Abnormal; Notable for the following components:      Result Value   Glucose, Bld 168 (*)    BUN 27 (*)    Creatinine,  Ser 1.80 (*)    Calcium 8.5 (*)    GFR calc non Af Amer 35 (*)    GFR calc Af Amer 41 (*)    All other components within normal limits  CBC - Abnormal; Notable for the following components:  WBC 14.9 (*)    Platelets 439 (*)    All other components within normal limits  TROPONIN I (HIGH SENSITIVITY) - Abnormal; Notable for the following components:   Troponin I (High Sensitivity) 413 (*)    All other components within normal limits  SARS CORONAVIRUS 2 BY RT PCR (HOSPITAL ORDER, [facility])  POC URINE PREG, ED  TROPONIN I (HIGH SENSITIVITY)    EKG EKG Interpretation  Date/Time:  Monday December 31 2019 12:24:23 EDT Ventricular Rate:  110 PR Interval:  134 QRS Duration: 84 QT Interval:  312 QTC Calculation: 422 R Axis:   102 Text Interpretation: Sinus tachycardia Anterolateral infarct , age undetermined Abnormal ECG Confirmed by Milton Ferguson [phone]) on 12/31/2019 4:12:50 PM Also confirmed by Milton Ferguson [phone]  on 12/31/2019 5:25:49 PM   Radiology MR ANGIO HEAD WO CONTRAST  Result Date: 12/31/2019 CLINICAL DATA:  Neuro deficit EXAM: MRI HEAD WITHOUT CONTRAST MRA HEAD WITHOUT CONTRAST TECHNIQUE: Multiplanar, multiecho pulse sequences of the brain and surrounding structures were obtained without intravenous contrast. Angiographic images of the head were obtained using MRA technique without contrast. COMPARISON:  None. FINDINGS: Some image sequences are degraded by motion artifact. MRI HEAD FINDINGS Brain: Focal restricted diffusion involving the right mid brain measuring 8 mm (5:13) in AP dimension. Scattered and confluent periventricular and subcortical white matter T2/FLAIR hyperintense foci. No midline shift, ventriculomegaly or extra-axial fluid collection. No mass lesion. Vascular: Please see MRA head. Skull and upper cervical spine: Normal marrow signal. Sinuses/Orbits: Normal orbits. Clear paranasal sinuses. No mastoid effusion. Other: None. MRA HEAD  FINDINGS Please note that time-of-flight imaging is degraded by motion artifact, limiting evaluation. Anterior circulation: Moderate left and mild right supraclinoid ICA narrowing. Irregularity of the left greater than right intracavernous ICAs may reflect atheromatous disease versus motion artifact. No significant proximal occlusion, aneurysm, or vascular malformation. The anterior and middle cerebral arteries are patent. Posterior circulation: Dominant left vertebral artery. No significant stenosis, proximal occlusion, aneurysm, or vascular malformation. Patent bilateral V4 segments and basilar artery. Patent bilateral PCAs. The proximal bilateral superior cerebellar arteries are patent, not well visualized distally secondary to motion artifact. Venous sinuses: No evidence of thrombosis. Anatomic variants: Left PCOM hypoplasia. Fetal origin of the right PCA. 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 Supratentorial white matter FLAIR hyperintensities. Differential includes demyelination versus chronic microvascular ischemic changes. Motion artifact limits evaluation on time-of-flight. Moderate left and mild right supraclinoid ICA narrowing. No large vessel occlusion. These results were called by telephone at the time of interpretation on 12/31/2019 at 5:24 pm to provider [facility] , who verbally acknowledged these results. Electronically Signed   By: Primitivo Gauze M.D.   On: 12/31/2019 17:46   MR BRAIN WO CONTRAST  Result Date: 12/31/2019 CLINICAL DATA:  Neuro deficit EXAM: MRI HEAD WITHOUT CONTRAST MRA HEAD WITHOUT CONTRAST TECHNIQUE: Multiplanar, multiecho pulse sequences of the brain and surrounding structures were obtained without intravenous contrast. Angiographic images of the head were obtained using MRA technique without contrast. COMPARISON:  None. FINDINGS: Some image sequences are degraded by motion artifact. MRI HEAD FINDINGS Brain: Focal restricted diffusion involving the  right mid brain measuring 8 mm (5:13) in AP dimension. Scattered and confluent periventricular and subcortical white matter T2/FLAIR hyperintense foci. No midline shift, ventriculomegaly or extra-axial fluid collection. No mass lesion. Vascular: Please see MRA head. Skull and upper cervical spine: Normal marrow signal. Sinuses/Orbits: Normal orbits. Clear paranasal sinuses. No mastoid effusion. Other: None. MRA HEAD FINDINGS  Please note that time-of-flight imaging is degraded by motion artifact, limiting evaluation. Anterior circulation: Moderate left and mild right supraclinoid ICA narrowing. Irregularity of the left greater than right intracavernous ICAs may reflect atheromatous disease versus motion artifact. No significant proximal occlusion, aneurysm, or vascular malformation. The anterior and middle cerebral arteries are patent. Posterior circulation: Dominant left vertebral artery. No significant stenosis, proximal occlusion, aneurysm, or vascular malformation. Patent bilateral V4 segments and basilar artery. Patent bilateral PCAs. The proximal bilateral superior cerebellar arteries are patent, not well visualized distally secondary to motion artifact. Venous sinuses: No evidence of thrombosis. Anatomic variants: Left PCOM hypoplasia. Fetal origin of the right PCA. 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 Supratentorial white matter FLAIR hyperintensities. Differential includes demyelination versus chronic microvascular ischemic changes. Motion artifact limits evaluation on time-of-flight. Moderate left and mild right supraclinoid ICA narrowing. No large vessel occlusion. These results were called by telephone at the time of interpretation on 12/31/2019 at 5:24 pm to provider [facility] Leightyn Cina , who verbally acknowledged these results. Electronically Signed   By: Primitivo Gauze M.D.   On: 12/31/2019 17:46   DG Chest Port 1 View  Result Date: 12/31/2019 CLINICAL DATA:  Chest pain EXAM:  PORTABLE CHEST 1 VIEW COMPARISON:  Multiple priors, most recent 09/13/2016 FINDINGS: The heart size and mediastinal contours are within normal limits. Both lungs are clear. No pleural effusion. No pneumothorax. The visualized skeletal structures are unremarkable. IMPRESSION: No active disease. Electronically Signed   By: Margaretha Sheffield MD   On: 12/31/2019 13:17    Procedures Procedures (including critical care time)  Medications Ordered in ED Medications  aspirin chewable tablet 324 mg (324 mg Oral Not Given 12/31/19 1804)  nitroGLYCERIN (NITROGLYN) 2 % ointment 1 inch (1 inch Topical Given 12/31/19 1752)    ED Course  I have reviewed the triage vital signs and the nursing notes.  Pertinent labs & imaging results that were available during my care of the patient were reviewed by me and considered in my medical decision making (see chart for details). CRITICAL CARE Performed by: Milton Ferguson Total critical care time: 45 minutes Critical care time was exclusive of separately billable procedures and treating other patients. Critical care was necessary to treat or prevent imminent or life-threatening deterioration. Critical care was time spent personally by me on the following activities: development of treatment plan with patient and/or surrogate as well as nursing, discussions with consultants, evaluation of patient's response to treatment, examination of patient, obtaining history from patient or surrogate, ordering and performing treatments and interventions, ordering and review of laboratory studies, ordering and review of radiographic studies, pulse oximetry and re-evaluation of patient's condition.    MDM Rules/Calculators/A&P                          MRI shows acute stroke.  MRI was negative for large vessel problems.  Patient also has elevated troponin.  She will be admitted to medicine for further work-up of the stroke and possible NSTEMI        This patient presents to the  ED for concern of numbness this involves an extensive number of treatment options, and is a complaint that carries with it a high risk of complications and morbidity.  The differential diagnosis includes CVA peripheral nerve paresthesias   Lab Tests:   I Ordered, reviewed, and interpreted labs, which included CBC chemistries which showed mild elevated creatinine and elevated white count.  Medicines ordered:  I ordered medication aspirin and nitroglycerin for elevated troponin  Imaging Studies ordered:   I ordered imaging studies which included MRI of the head shows stroke  I independently visualized and interpreted imaging which showed small stroke  Additional history obtained:   Additional history obtained from old records  Previous records obtained and reviewed.  Consultations Obtained:   I consulted hospitalist and discussed lab and imaging findings  Reevaluation:  After the interventions stated above, I reevaluated the patient and found no change  Critical Interventions:  .   Final Clinical Impression(s) / ED Diagnoses Final diagnoses:  Cerebrovascular accident (CVA), unspecified mechanism ([place])    Rx / [place] Orders ED Discharge Orders    None       Milton Ferguson, MD 12/31/19 1859

## 2020-01-01 ENCOUNTER — Inpatient Hospital Stay (HOSPITAL_COMMUNITY): Payer: BLUE CROSS/BLUE SHIELD

## 2020-01-01 ENCOUNTER — Encounter (HOSPITAL_COMMUNITY): Payer: Self-pay | Admitting: Nephrology

## 2020-01-01 ENCOUNTER — Other Ambulatory Visit: Payer: Self-pay

## 2020-01-01 DIAGNOSIS — Z720 Tobacco use: Secondary | ICD-10-CM

## 2020-01-01 DIAGNOSIS — N1831 Chronic kidney disease, stage 3a: Secondary | ICD-10-CM

## 2020-01-01 DIAGNOSIS — I6389 Other cerebral infarction: Secondary | ICD-10-CM

## 2020-01-01 DIAGNOSIS — E109 Type 1 diabetes mellitus without complications: Secondary | ICD-10-CM

## 2020-01-01 DIAGNOSIS — N179 Acute kidney failure, unspecified: Secondary | ICD-10-CM

## 2020-01-01 DIAGNOSIS — N183 Chronic kidney disease, stage 3 unspecified: Secondary | ICD-10-CM

## 2020-01-01 DIAGNOSIS — N184 Chronic kidney disease, stage 4 (severe): Secondary | ICD-10-CM

## 2020-01-01 DIAGNOSIS — E785 Hyperlipidemia, unspecified: Secondary | ICD-10-CM

## 2020-01-01 DIAGNOSIS — N170 Acute kidney failure with tubular necrosis: Secondary | ICD-10-CM

## 2020-01-01 DIAGNOSIS — I639 Cerebral infarction, unspecified: Secondary | ICD-10-CM

## 2020-01-01 DIAGNOSIS — R778 Other specified abnormalities of plasma proteins: Secondary | ICD-10-CM

## 2020-01-01 DIAGNOSIS — R0789 Other chest pain: Secondary | ICD-10-CM

## 2020-01-01 DIAGNOSIS — R072 Precordial pain: Secondary | ICD-10-CM

## 2020-01-01 DIAGNOSIS — N189 Chronic kidney disease, unspecified: Secondary | ICD-10-CM

## 2020-01-01 HISTORY — DX: Acute kidney failure, unspecified: N18.9

## 2020-01-01 HISTORY — DX: Chronic kidney disease, unspecified: N17.9

## 2020-01-01 HISTORY — DX: Cerebral infarction, unspecified: I63.9

## 2020-01-01 LAB — CBC
HCT: 36.6 % (ref 36.0–46.0)
Hemoglobin: 11.6 g/dL — ABNORMAL LOW (ref 12.0–15.0)
MCH: 29 pg (ref 26.0–34.0)
MCHC: 31.7 g/dL (ref 30.0–36.0)
MCV: 91.5 fL (ref 80.0–100.0)
Platelets: 374 10*3/uL (ref 150–400)
RBC: 4 MIL/uL (ref 3.87–5.11)
RDW: 13.3 % (ref 11.5–15.5)
WBC: 10.8 10*3/uL — ABNORMAL HIGH (ref 4.0–10.5)
nRBC: 0 % (ref 0.0–0.2)

## 2020-01-01 LAB — LIPID PANEL
Cholesterol: 193 mg/dL (ref 0–200)
HDL: 25 mg/dL — ABNORMAL LOW (ref >40)
LDL Cholesterol: UNDETERMINED mg/dL (ref 0–99)
Total CHOL/HDL Ratio: 7.7 RATIO
Triglycerides: 416 mg/dL — ABNORMAL HIGH (ref <150)
VLDL: UNDETERMINED mg/dL (ref 0–40)

## 2020-01-01 LAB — BASIC METABOLIC PANEL
Anion gap: 10 (ref 5–15)
BUN: 24 mg/dL — ABNORMAL HIGH (ref 6–20)
CO2: 21 mmol/L — ABNORMAL LOW (ref 22–32)
Calcium: 8.2 mg/dL — ABNORMAL LOW (ref 8.9–10.3)
Chloride: 110 mmol/L (ref 98–111)
Creatinine, Ser: 1.62 mg/dL — ABNORMAL HIGH (ref 0.44–1.00)
GFR calc Af Amer: 47 mL/min — ABNORMAL LOW (ref >60)
GFR calc non Af Amer: 40 mL/min — ABNORMAL LOW (ref >60)
Glucose, Bld: 85 mg/dL (ref 70–99)
Potassium: 4.1 mmol/L (ref 3.5–5.1)
Sodium: 141 mmol/L (ref 135–145)

## 2020-01-01 LAB — ECHOCARDIOGRAM COMPLETE
AR max vel: 2.1 cm2
AV Area VTI: 2.04 cm2
AV Area mean vel: 1.82 cm2
AV Mean grad: 4.8 mmHg
AV Peak grad: 8.7 mmHg
Ao pk vel: 1.48 m/s
Area-P 1/2: 6.27 cm2
Height: 65 in
S' Lateral: 2.62 cm

## 2020-01-01 LAB — RAPID URINE DRUG SCREEN, HOSP PERFORMED
Amphetamines: NOT DETECTED
Barbiturates: NOT DETECTED
Benzodiazepines: NOT DETECTED
Cocaine: NOT DETECTED
Opiates: NOT DETECTED
Tetrahydrocannabinol: NOT DETECTED

## 2020-01-01 LAB — HEMOGLOBIN A1C
Hgb A1c MFr Bld: 10.3 % — ABNORMAL HIGH (ref 4.8–5.6)
Mean Plasma Glucose: 248.91 mg/dL

## 2020-01-01 LAB — URINALYSIS, COMPLETE (UACMP) WITH MICROSCOPIC
Bilirubin Urine: NEGATIVE
Glucose, UA: 50 mg/dL — AB
Hgb urine dipstick: NEGATIVE
Ketones, ur: NEGATIVE mg/dL
Leukocytes,Ua: NEGATIVE
Nitrite: NEGATIVE
Protein, ur: >=300 mg/dL — AB
Specific Gravity, Urine: 1.012 (ref 1.005–1.030)
pH: 8 (ref 5.0–8.0)

## 2020-01-01 LAB — CBG MONITORING, ED
Glucose-Capillary: 59 mg/dL — ABNORMAL LOW (ref 70–99)
Glucose-Capillary: 225 mg/dL — ABNORMAL HIGH (ref 70–99)
Glucose-Capillary: 235 mg/dL — ABNORMAL HIGH (ref 70–99)
Glucose-Capillary: 316 mg/dL — ABNORMAL HIGH (ref 70–99)

## 2020-01-01 LAB — VITAMIN B12: Vitamin B-12: 152 pg/mL — ABNORMAL LOW (ref 180–914)

## 2020-01-01 LAB — HIV ANTIBODY (ROUTINE TESTING W REFLEX): HIV Screen 4th Generation wRfx: NONREACTIVE

## 2020-01-01 LAB — LDL CHOLESTEROL, DIRECT: Direct LDL: 84.9 mg/dL (ref 0–99)

## 2020-01-01 LAB — GLUCOSE, CAPILLARY: Glucose-Capillary: 250 mg/dL — ABNORMAL HIGH (ref 70–99)

## 2020-01-01 IMAGING — US US RENAL
1 series · 14 of 25 positions shown · non-contrast
Comparison: [DATE]

CLINICAL DATA: Acute on chronic renal failure, diabetes mellitus

EXAM:
RENAL / URINARY TRACT ULTRASOUND COMPLETE

[Series 1: us renal · 14 of 41 slices shown]
[im 1/41]
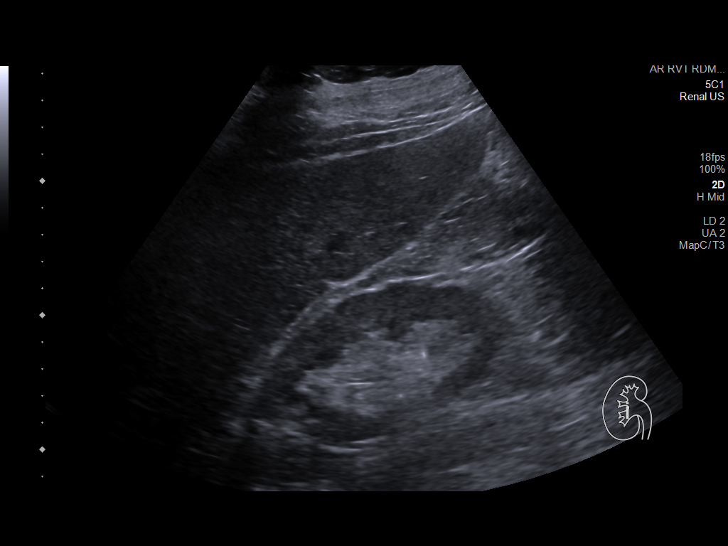
[im 4/41]
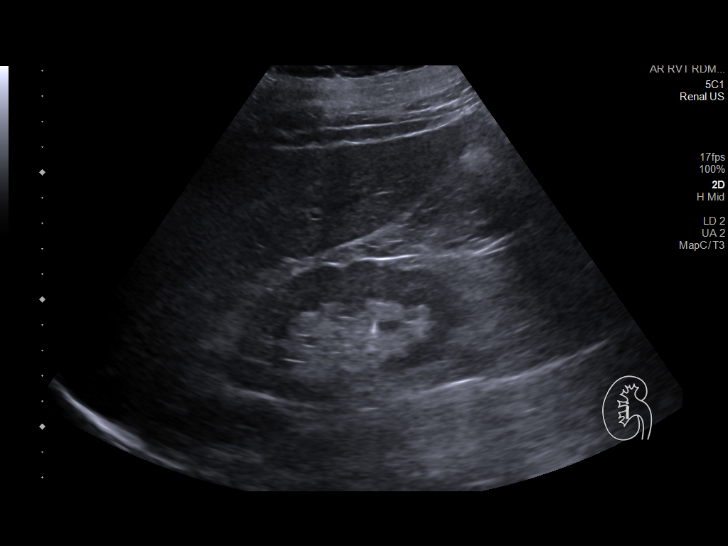
[im 7/41]
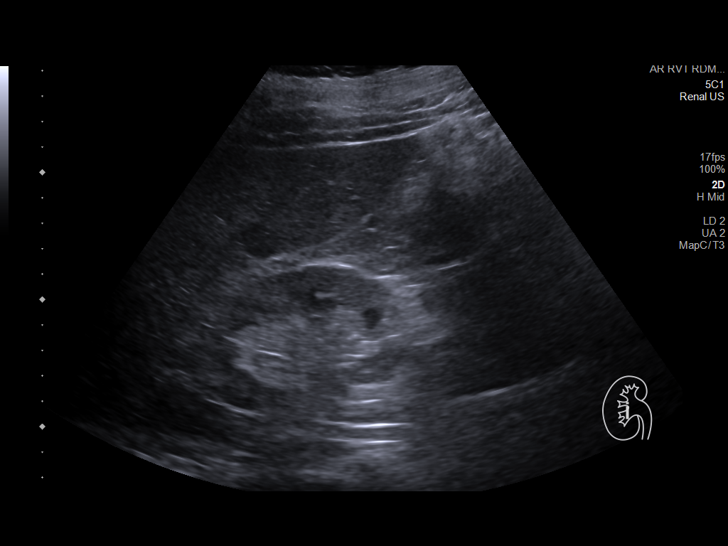
[im 11/41]
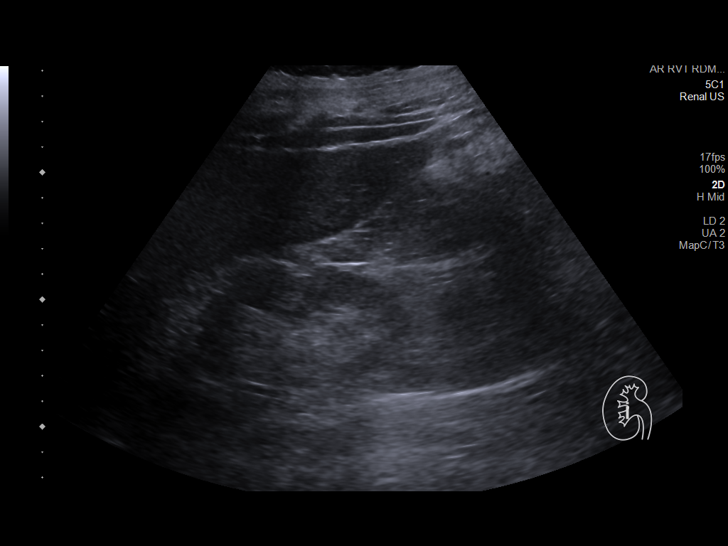
[im 14/41]
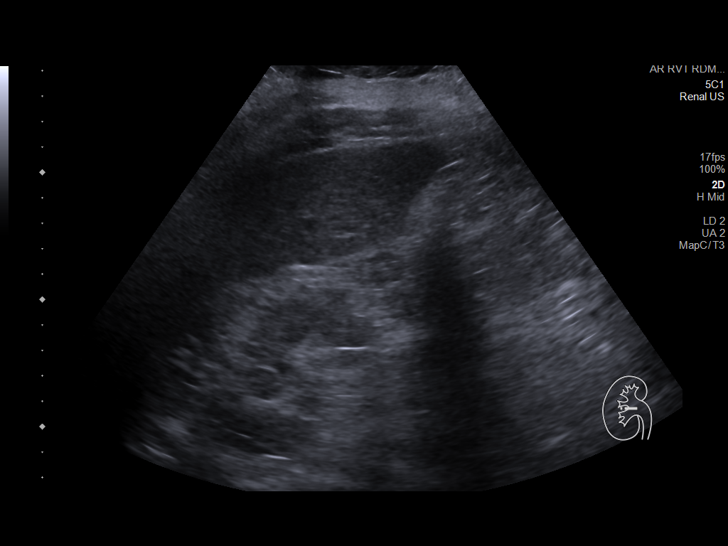
[im 16/41]
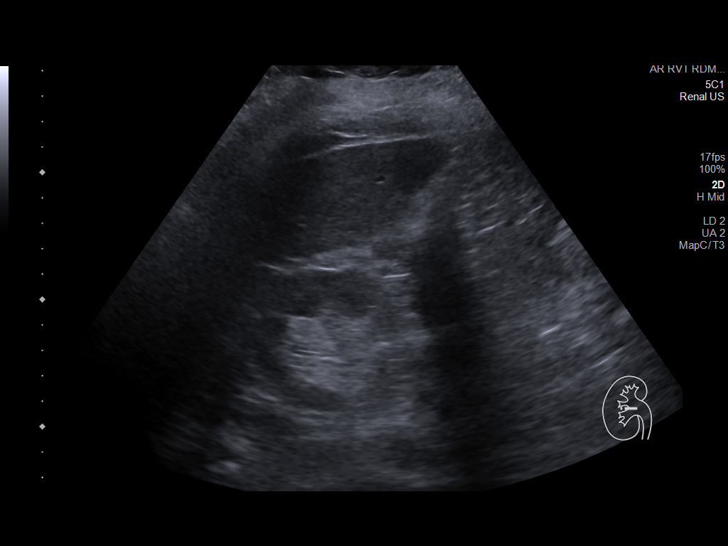
[im 19/41]
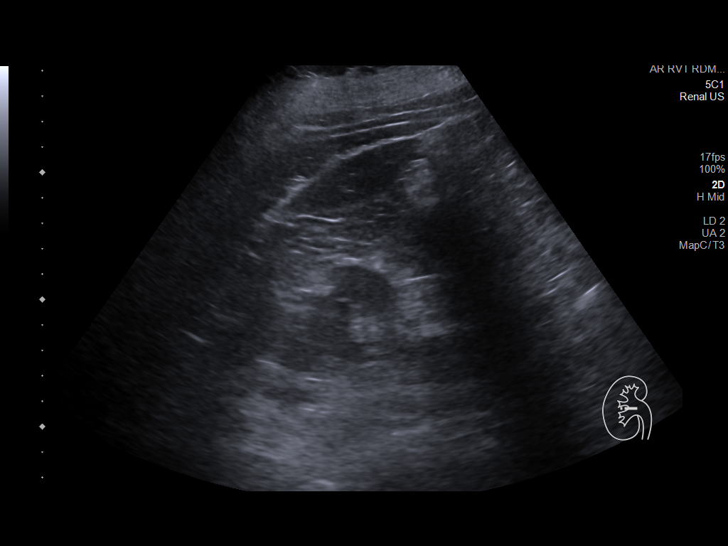
[im 22/41]
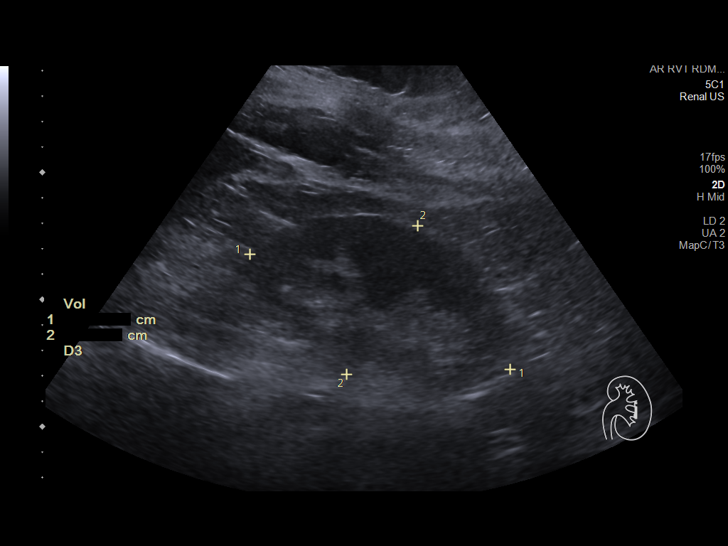
[im 26/41]
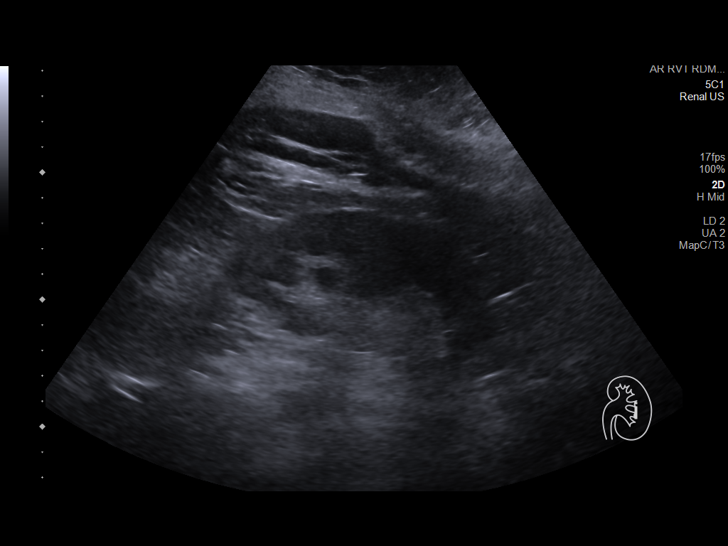
[im 27/41]
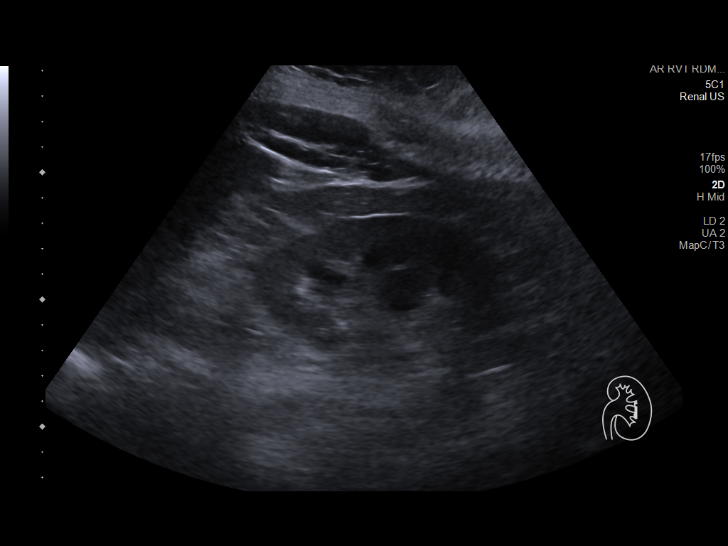
[im 31/41]
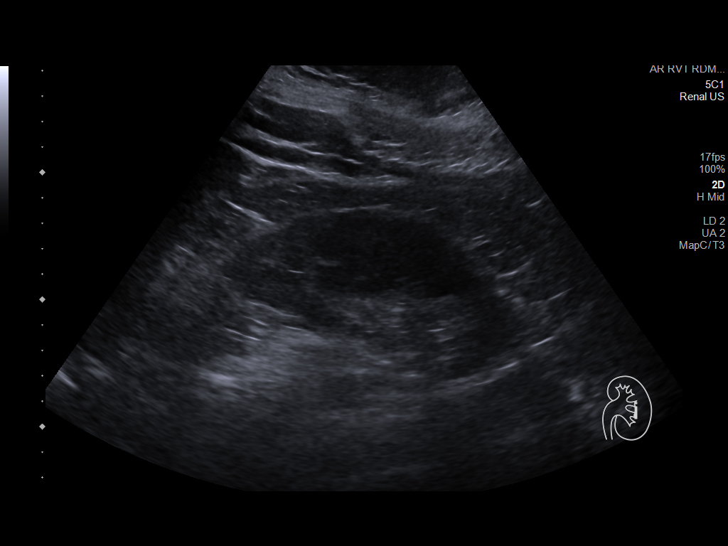
[im 34/41]
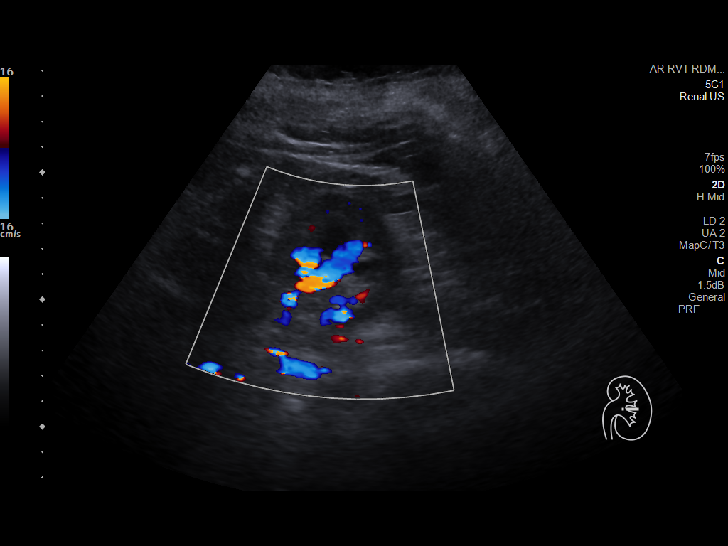
[im 37/41]
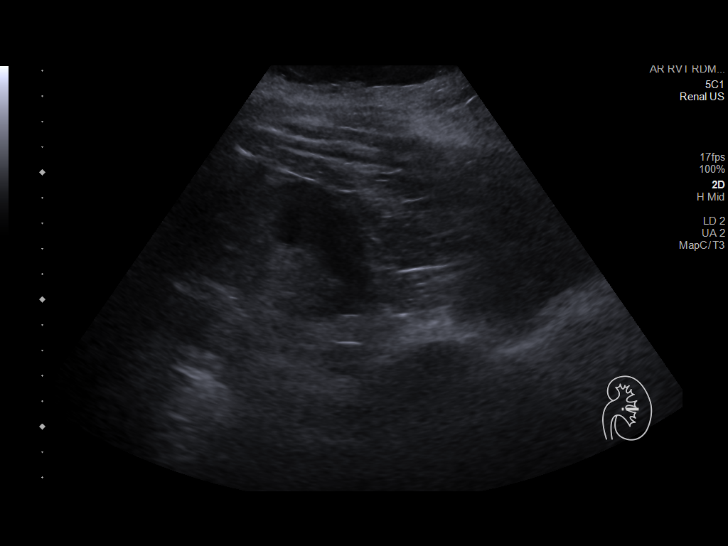
[im 41/41]
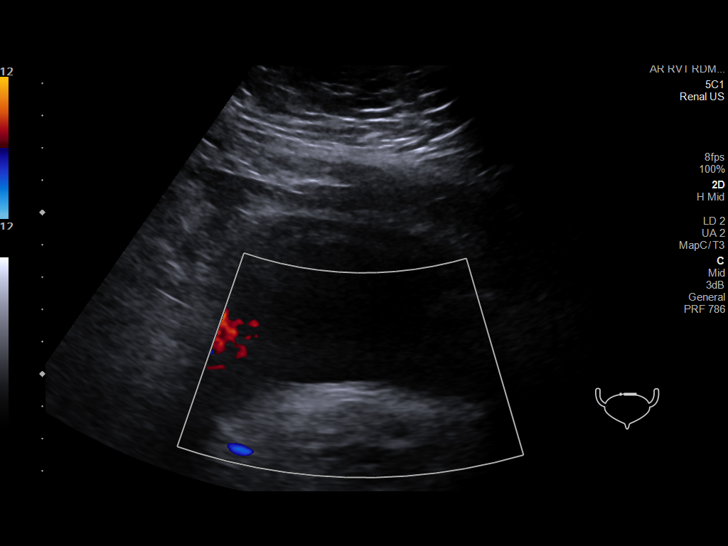

[14 of 25 positions shown; findings below may reference images not displayed]

FINDINGS: Right Kidney:

Renal measurements: 10.6 x 5.8 x 6.9 cm = volume: 221 mL. Normal
cortical thickness and upper normal cortical echogenicity. No mass,
hydronephrosis, or shadowing calcification.

Left Kidney:

Renal measurements: 11.2 x 6.5 x 5.4 cm = volume: 2 of 5 mL. Normal
cortical thickness. Upper normal cortical echogenicity. No mass,
hydronephrosis, or shadowing calcification.

Bladder:

Appears normal for degree of bladder distention.

Other:

N/A
IMPRESSION: No renal sonographic abnormalities.

## 2020-01-01 IMAGING — US US CAROTID DUPLEX BILAT
1 series · 13 of 24 positions shown · non-contrast
Comparison: None.

CLINICAL DATA: Acute CVA.

EXAM:
BILATERAL CAROTID DUPLEX ULTRASOUND
TECHNIQUE: Gray scale imaging, color Doppler and duplex ultrasound were
performed of bilateral carotid and vertebral arteries in the neck.

[Series 1: us carotid bilateral · 13 of 68 slices shown]
[im 1/68]
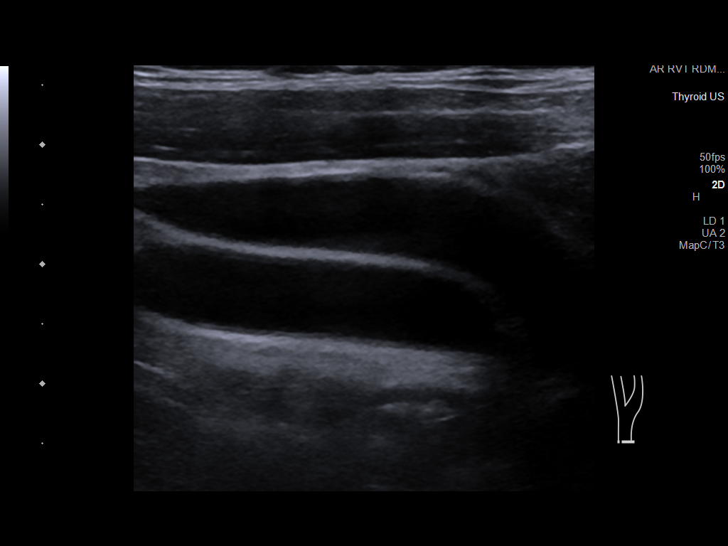
[im 6/68]
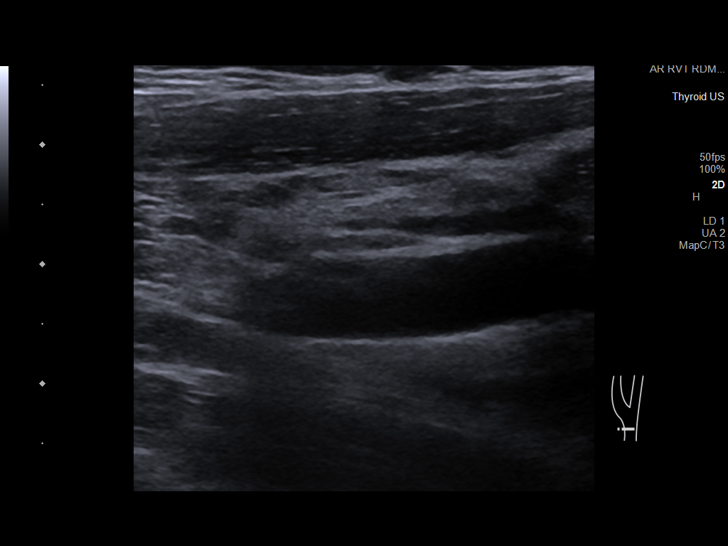
[im 12/68]
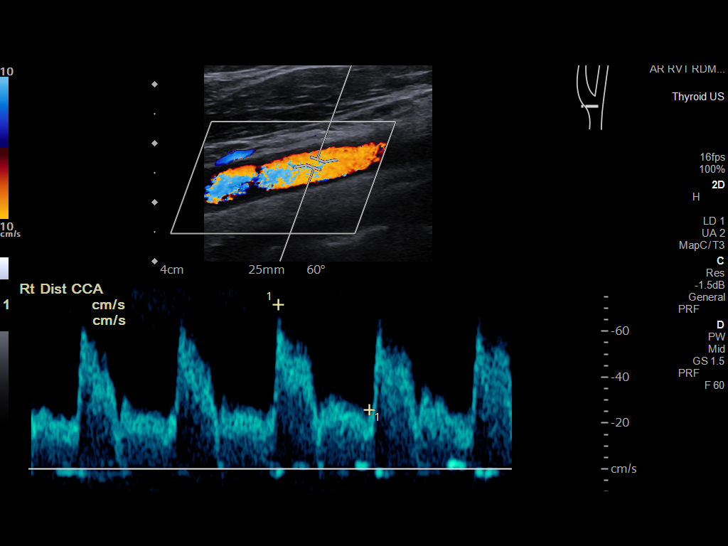
[im 18/68]
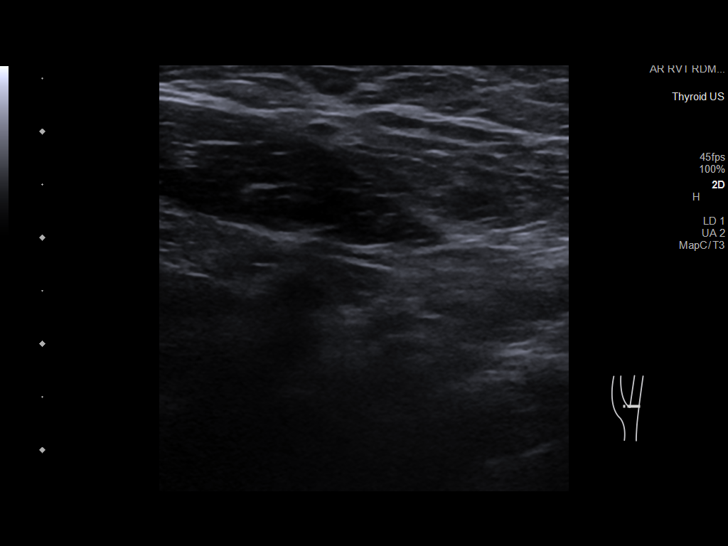
[im 24/68]
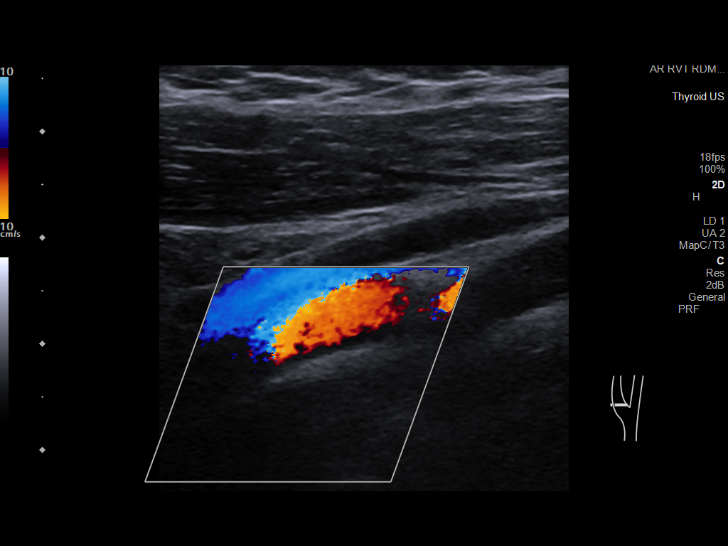
[im 30/68]
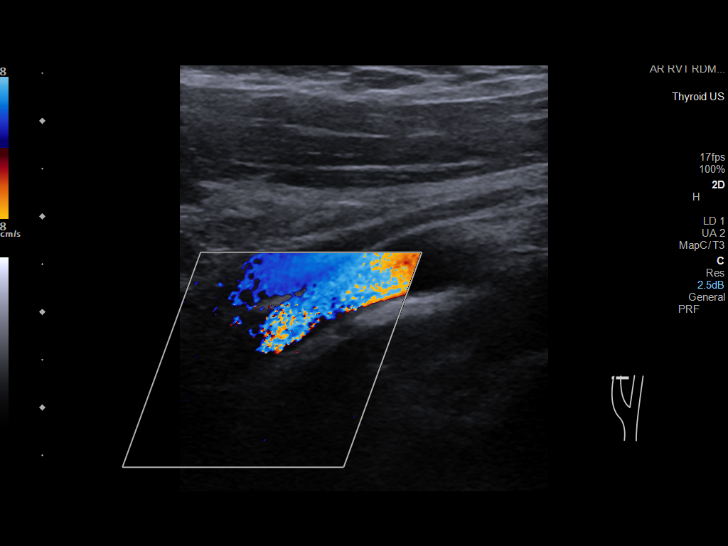
[im 35/68]
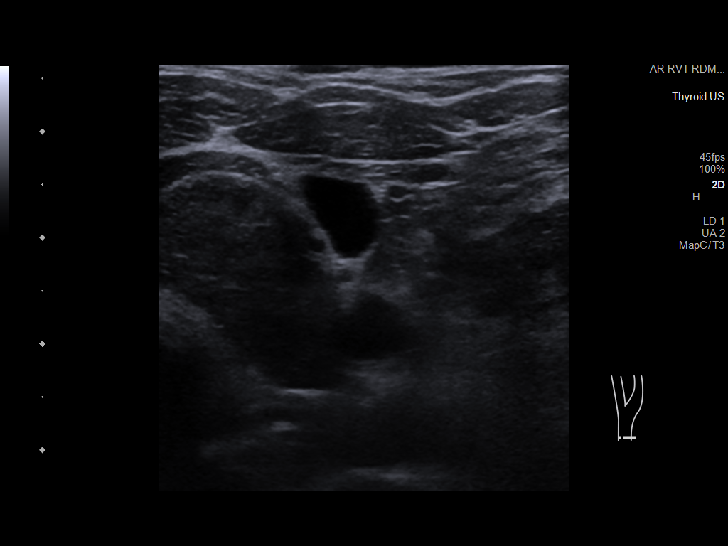
[im 38/68]
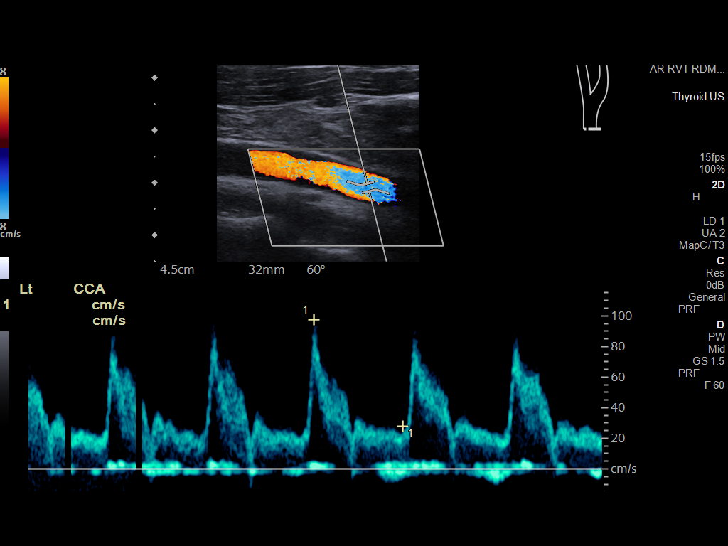
[im 44/68]
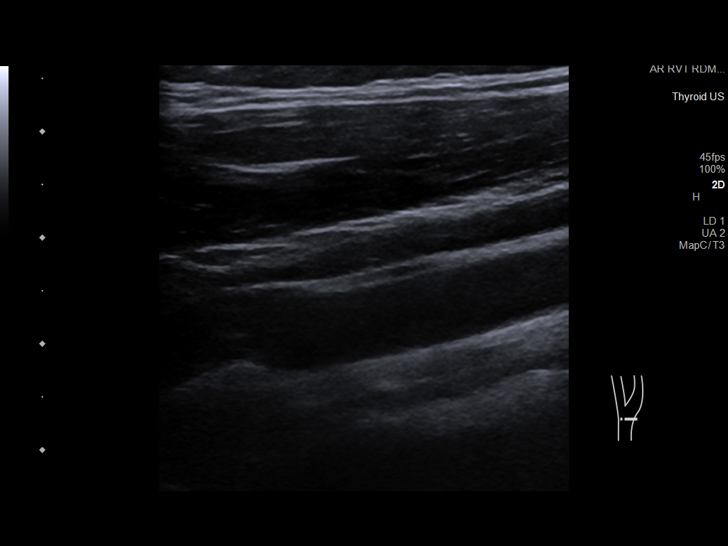
[im 50/68]
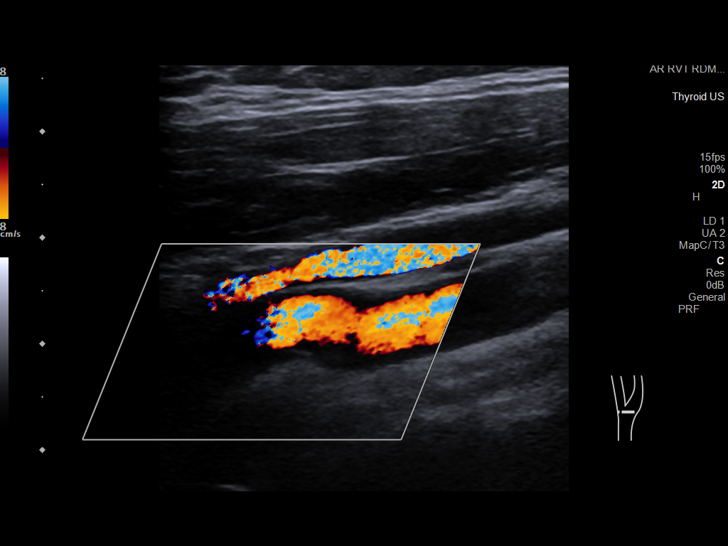
[im 56/68]
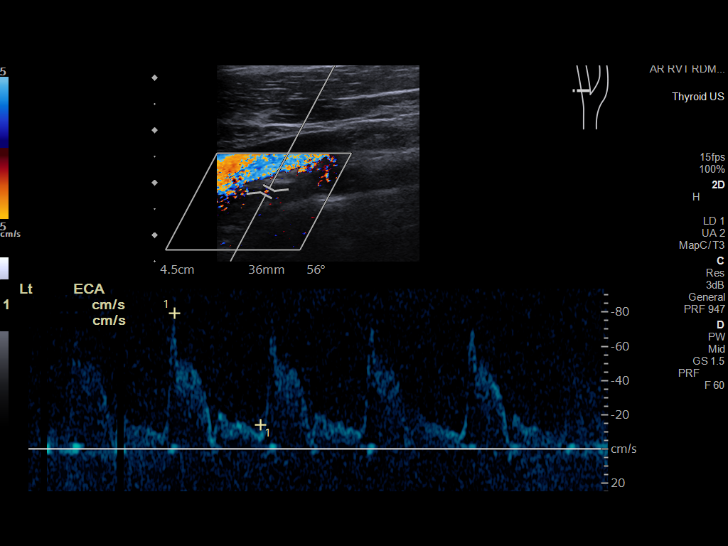
[im 62/68]
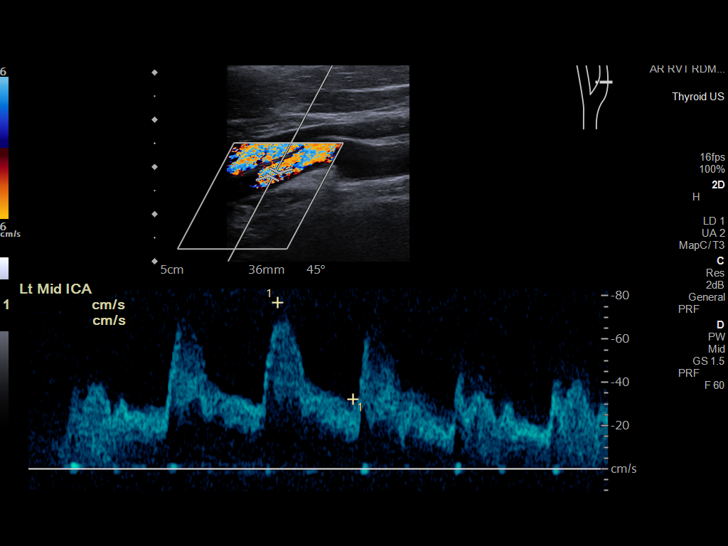
[im 68/68]
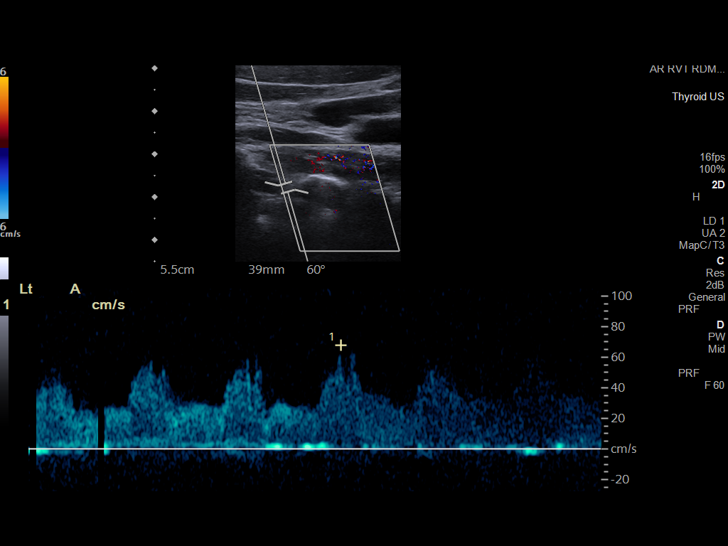

[13 of 24 positions shown; findings below may reference images not displayed]

FINDINGS: Criteria: Quantification of carotid stenosis is based on velocity
parameters that correlate the residual internal carotid diameter
with NASCET-based stenosis levels, using the diameter of the distal
internal carotid lumen as the denominator for stenosis measurement.

The following velocity measurements were obtained:

RIGHT

ICA: 83/35 cm/sec

CCA: 100/27 cm/sec

SYSTOLIC ICA/CCA RATIO:

ECA: 90 cm/sec

LEFT

ICA: 91/39 cm/sec

CCA: 97/30 cm/sec

SYSTOLIC ICA/CCA RATIO:

ECA: 79 cm/sec

RIGHT CAROTID ARTERY: Minimal plaque or intimal thickening at the
right carotid bulb. External carotid artery is patent with normal
waveform. Normal waveforms and velocities in the internal carotid
artery.

RIGHT VERTEBRAL ARTERY: Antegrade flow and normal waveform in the
right vertebral artery.

LEFT CAROTID ARTERY: Minimal plaque at the left carotid bulb.
External carotid artery is patent with normal waveform. Minimal
plaque in the proximal internal carotid artery. Normal waveforms and
velocities in the internal carotid artery.

LEFT VERTEBRAL ARTERY: Antegrade flow and normal waveform in the
left vertebral artery.
IMPRESSION: 1. Minimal plaque in the carotid arteries, left side greater than
right. Estimated degree of stenosis in the internal carotid arteries
is less than 50% bilaterally.
2. Patent vertebral arteries with antegrade flow.

## 2020-01-01 MED ORDER — INSULIN GLARGINE 100 UNIT/ML ~~LOC~~ SOLN
20.0000 [IU] | Freq: Every day | SUBCUTANEOUS | Status: DC
  Filled 2020-01-01 (×2): qty 0.2

## 2020-01-01 MED ORDER — FENOFIBRATE 160 MG PO TABS
160.0000 mg | ORAL_TABLET | Freq: Every day | ORAL | Status: DC
  Administered 2020-01-01 – 2020-01-02 (×2): 160 mg via ORAL
  Filled 2020-01-01 (×3): qty 1

## 2020-01-01 MED ORDER — DEXTROSE 50 % IV SOLN
25.0000 mL | Freq: Once | INTRAVENOUS | Status: AC
  Administered 2020-01-01: 25 mL via INTRAVENOUS
  Filled 2020-01-01: qty 50

## 2020-01-01 MED ORDER — ASPIRIN EC 81 MG PO TBEC
81.0000 mg | DELAYED_RELEASE_TABLET | Freq: Every day | ORAL | Status: DC
  Administered 2020-01-01 – 2020-01-02 (×2): 81 mg via ORAL
  Filled 2020-01-01 (×2): qty 1

## 2020-01-01 MED ORDER — SODIUM CHLORIDE 0.9 % IV SOLN: INTRAVENOUS | Status: AC

## 2020-01-01 MED ORDER — INSULIN GLARGINE 100 UNIT/ML ~~LOC~~ SOLN
20.0000 [IU] | Freq: Every day | SUBCUTANEOUS | Status: DC
  Administered 2020-01-01 – 2020-01-02 (×2): 20 [IU] via SUBCUTANEOUS
  Filled 2020-01-01 (×3): qty 0.2

## 2020-01-01 MED ORDER — CYANOCOBALAMIN 1000 MCG/ML IJ SOLN
1000.0000 ug | INTRAMUSCULAR | Status: AC
  Administered 2020-01-01: 1000 ug via INTRAMUSCULAR
  Filled 2020-01-01: qty 1

## 2020-01-01 MED ORDER — ATORVASTATIN CALCIUM 40 MG PO TABS
40.0000 mg | ORAL_TABLET | Freq: Every day | ORAL | Status: DC
  Administered 2020-01-01 – 2020-01-02 (×2): 40 mg via ORAL
  Filled 2020-01-01 (×2): qty 1

## 2020-01-01 MED ORDER — PERFLUTREN LIPID MICROSPHERE
1.0000 mL | INTRAVENOUS | Status: AC | PRN
  Administered 2020-01-01: 1 mL via INTRAVENOUS

## 2020-01-01 NOTE — Evaluation (Signed)
Speech Language Pathology Evaluation Patient Details Name: Lauren [name] MRN: [id number] DOB: 03-23-1981 Today's Date: 01/01/2020 Time: 6606-3016 SLP Time Calculation (min) (ACUTE ONLY): 30 min  Problem List:  Patient Active Problem List   Diagnosis Date Noted   Acute renal failure superimposed on stage 3a chronic kidney disease (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 Ratterree presented to [facility] ED on 12/31/2019 for evaluation of numbness along the left side of her face and left arm. She also reported occasional episodes of chest discomfort. In talking with the patient today, she reports developing a numbness sensation along the left side of her face on Sunday morning which she reports felt like she "received lidocaine" and her teeth and gums were numb as well. Symptoms were persistent throughout the day and she called her PCP to try to arrange a visit for the following morning. On Monday morning, her numbness radiated into her left hand and fingers and she reports associated chest pressure when walking outside. Her chest pain lasted for approximately 30 minutes and then resolved. She denies any recurrent pain since. 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 MRI reveals: "Acute right midbrain infarct measuring 8 mm."  Assessment / Plan / Recommendation Clinical Impression  Cognitive linguistic evaluation completed by means of the [place] Examination; She achieved a score of 27/30 indicating her cognitive functioning to be "normal". Pt reports no changes in her speech or thinking. Pt answered questions pertaining to biographical information without incident and demonstrated appropriate conversational interaction throughout evaluation. There are no further ST needs noted at this time. ST to sign off. Thank you,    SLP Assessment  SLP Recommendation/Assessment: Patient does not need any further Speech Lanaguage Pathology Services SLP Visit Diagnosis: Cognitive communication deficit (R41.841)             SLP Evaluation Cognition  Overall Cognitive Status: Within Functional Limits for tasks assessed Arousal/Alertness: Awake/alert Orientation Level: Oriented X4 Attention: Focused Focused Attention: Appears intact Memory: Appears intact Memory Recall Sock: Without Cue Memory Recall Blue: Without Cue Memory Recall Bed: Without Cue Awareness: Appears intact Problem Solving: Appears intact       Comprehension  Auditory Comprehension Overall Auditory Comprehension: Appears within functional limits for tasks assessed Visual Recognition/Discrimination Discrimination: Not tested Reading Comprehension Reading Status: Not tested    Expression Expression Primary Mode of Expression: Verbal Verbal Expression Overall Verbal Expression: Appears within functional limits for tasks assessed Written Expression Dominant Hand: Right   Oral / Motor  Oral Motor/Sensory Function Overall Oral Motor/Sensory Function: Within functional limits Motor Speech Overall Motor Speech: Appears within functional limits for tasks assessed Respiration: Within functional limits  Gerldine Suleiman H. Roddie Mc,  CCC-SLP Speech Language Pathologist         Wende Bushy 01/01/2020, 2:32 PM

## 2020-01-01 NOTE — ED Notes (Signed)
Neurologist assessing pt

## 2020-01-01 NOTE — Evaluation (Addendum)
Physical Therapy Evaluation Patient Details Name: Lauren [name] MRN: [id number] DOB: [birth year] Today's Date: 01/01/2020   History of Present Illness  The patient is a 39 y.o. year-old w/ hx of MI, DM2, CKD 3 presents w/ numbness of the L face and arm yesterday. Today had some numbness to the L leg and some chest discomfort. Came to ED where MRI showed an acute CVA of the midbrain. MRA was negative for large vessel occlusion. Also trop is 410.  Asked to see for admission.  Given asa and ntg for ^troponin.     Clinical Impression  The patient was able to perform supine to sit transfer independently without LOB. She tolerated upright sitting with good balance and no increase in pain. The patient ambulated 120 feet min guard assist with balance deficits noted due to LLE gross weakness and coordination deficits. There was decreased sensation on the dorsum of the L foot when compared with R dorsum of foot. The patient was left in bed w husband present after therapy- RN notified. PLAN: The patient will continue to benefit from skilled physical therapy services in hospital at recommended venue below in order to improve balance, gait, and ADL's to promote independence in functional activities.      Follow Up Recommendations Outpatient PT    Equipment Recommendations  None recommended by PT    Recommendations for Other Services       Precautions / Restrictions Precautions Precautions: Fall Precaution Comments:  (L sided weakness noted during ambulation) Restrictions Weight Bearing Restrictions: No      Mobility  Bed Mobility Overal bed mobility: Independent                Transfers Overall transfer level: Modified independent Equipment used: None             General transfer comment:  (increased time, slight unsteadiness upon standing)  Ambulation/Gait Ambulation/Gait assistance: Supervision;Min guard Gait Distance (Feet): 120 Feet Assistive device: None Gait  Pattern/deviations: Step-through pattern;Decreased step length - right;Decreased stance time - left;Decreased weight shift to right;Ataxic     General Gait Details: decreased weight acceptance in LLE resulting in gait deficits and imbalances observed  Stairs            Wheelchair Mobility    Modified Rankin (Stroke Patients Only)       Balance Overall balance assessment: Needs assistance Sitting-balance support: Bilateral upper extremity supported;Feet supported Sitting balance-Leahy Scale: Normal     Standing balance support: No upper extremity supported;During functional activity Standing balance-Leahy Scale: Fair Standing balance comment: decreased L hip stability                             Pertinent Vitals/Pain Pain Assessment: No/denies pain    Home Living Family/patient expects to be discharged to:: Private residence Living Arrangements: Spouse/significant other;Children Available Help at Discharge: Family;Available 24 hours/day Type of Home: Mobile home Home Access: Stairs to enter Entrance Stairs-Rails: Can reach both Entrance Stairs-Number of Steps: 5 Home Layout: One level Home Equipment: None Additional Comments:  (not using AD at this time)    Prior Function Level of Independence: Independent         Comments:  (independent w driving, shopping, all ADL's)     Hand Dominance   Dominant Hand: Right    Extremity/Trunk Assessment   Upper Extremity Assessment Upper Extremity Assessment: Defer to OT evaluation    Lower Extremity Assessment Lower Extremity Assessment: RLE deficits/detail;LLE  deficits/detail RLE Deficits / Details: strength WFL RLE Sensation: WNL RLE Coordination: WNL LLE Deficits / Details: hip flex 4+/5, knee ext 5/5, DF 5/5 LLE Sensation: decreased light touch (decreased sensation over dorsum of foot) LLE Coordination: decreased gross motor    Cervical / Trunk Assessment Cervical / Trunk Assessment: Normal   Communication   Communication: No difficulties  Cognition Arousal/Alertness: Awake/alert Behavior During Therapy: WFL for tasks assessed/performed Overall Cognitive Status: Within Functional Limits for tasks assessed                                        General Comments      Exercises     Assessment/Plan    PT Assessment Patient needs continued PT services  PT Problem List Decreased strength;Decreased range of motion;Decreased balance;Decreased mobility;Decreased coordination;Decreased safety awareness;Impaired sensation;Obesity       PT Treatment Interventions Gait training;Functional mobility training;Therapeutic activities;Therapeutic exercise;Balance training;Neuromuscular re-education;Patient/family education    PT Goals (Current goals can be found in the Care Plan section)  Acute Rehab PT Goals Patient Stated Goal: go home PT Goal Formulation: With patient/family Time For Goal Achievement: 01/04/20 Potential to Achieve Goals: Good    Frequency Min 3X/week   Barriers to discharge        Co-evaluation               AM-PAC PT "6 Clicks" Mobility  Outcome Measure Help needed turning from your back to your side while in a flat bed without using bedrails?: None Help needed moving from lying on your back to sitting on the side of a flat bed without using bedrails?: None Help needed moving to and from a bed to a chair (including a wheelchair)?: A Little Help needed standing up from a chair using your arms (e.g., wheelchair or bedside chair)?: None Help needed to walk in hospital room?: A Little Help needed climbing 3-5 steps with a railing? : A Little 6 Click Score: 21    End of Session   Activity Tolerance: Patient tolerated treatment well Patient left: in bed;with call bell/phone within reach;with family/visitor present Nurse Communication: Mobility status PT Visit Diagnosis: Unsteadiness on feet (R26.81);Other abnormalities of gait and  mobility (R26.89);Muscle weakness (generalized) (M62.81);Ataxic gait (R26.0);Hemiplegia and hemiparesis Hemiplegia - Right/Left: Left Hemiplegia - dominant/non-dominant: Non-dominant Hemiplegia - caused by: Cerebral infarction    Time: 0842-0900 PT Time Calculation (min) (ACUTE ONLY): 18 min   Charges:   PT Evaluation $PT Eval Low Complexity: 1 Low PT Treatments $Therapeutic Activity: 8-22 mins        10:42 AM , 01/01/20 Karlyn Agee, SPT Physical Therapy with [place]  [facility] [phone] office   During this treatment session, the therapist was present, participating in and directing the treatment.  10:42 AM, 01/01/20 Lonell Grandchild, MPT Physical Therapist with [facility] [phone] office [phone] mobile phone

## 2020-01-01 NOTE — TOC Progression Note (Signed)
Transition of Care [facility]) - Progression Note    Patient Details  Name: Lauren [name] MRN: [id number] Date of Birth: Mar 01, 1981  Transition of Care [facility]) CM/SW Contact  Salome Arnt, [place] Phone Number: 01/01/2020, 11:13 AM  Clinical Narrative:  PT recommending outpatient PT. LCSW discussed with pt who is agreeable to [facility]. LCSW made referral and information is on AVS. Pt aware outpatient rehab will call to schedule appointment.         Barriers to Discharge: Continued Medical Work up  Expected Discharge Plan and Services                                                 Social Determinants of Health (SDOH) Interventions    Readmission Risk Interventions No flowsheet data found.

## 2020-01-01 NOTE — Progress Notes (Signed)
Admit with: Acute ischemic stroke/ CP with Elevated Troponin  History: DM, CKD  Home DM Meds: Lantus 62 units QAM                             Humalog 5-10 units TID per SSI  Current Orders: Novolog Moderate Correction Scale/ SSI (0-15 units) TID AC + HS    Used to see Dr. Dorris Fetch for Diabetes Management--last visit with ENDO was 07/24/2015   Spoke w/ pt by phone this afternoon.  Pt told me she was concerned that she did not get any Lantus this AM.  Explained to pt that they likely did not order any Lantus this AM since her CBG was down to 59 this AM.  Pt told me she has asked her RN about her Lantus and would like a dose but doesn't want to get her Lantus too late b/c she stated she has issues with Hypoglycemia at home if she takes her Lantus too late in the day.  Used to see Dr. Dorris Fetch for DM management.  Now sees Dr. Lemmie Evens (PCP).    Reviewed current A1c of 10.3% with pt.  Discussed with pt that given her age, her likely A1c goal should be 7% to prevent further organ/blood vessel damage.   Pt told me that the current A1c of 10.3% is better given her last A1c a year ago was 13%.  Has CBG meter and supplies at home and also has insulin at home.  Did not have any questions for me but did request a dose of Lantus this afternoon.  Sent Secure Chat to Dr. Carles Collet requesting small dose of Lantus for this afternoon.    --Will follow patient during hospitalization--  Wyn Quaker RN, MSN, CDE Diabetes Coordinator Inpatient Glycemic Control Team Team Pager: [phone] (8a-5p)

## 2020-01-01 NOTE — Progress Notes (Signed)
Inpatient Diabetes Program Recommendations  AACE/ADA: New Consensus Statement on Inpatient Glycemic Control (2015)  Target Ranges:  Prepandial:   less than 140 mg/dL      Peak postprandial:   less than 180 mg/dL (1-2 hours)      Critically ill patients:  140 - 180 mg/dL   Results for NETTYE, FLEGAL (MRN [id number]) as of 01/01/2020 09:20  Ref. Range 01/01/2020 08:24  Glucose-Capillary Latest Ref Range: 70 - 99 mg/dL 59 (L)   Results for ALYSCIA, CARMON (MRN [id number]) as of 01/01/2020 09:20  Ref. Range 12/31/2019 16:20  Hemoglobin A1C Latest Ref Range: 4.8 - 5.6 % 10.3 (H)  (248 mg/dl)   Admit with: Acute ischemic stroke/ CP with Elevated Troponin  History: DM, CKD  Home DM Meds: Lantus 62 units QAM       Humalog 5-10 units TID per SSI  Current Orders: Novolog Moderate Correction Scale/ SSI (0-15 units) TID AC + HS    Used to see Dr. Dorris Fetch for Diabetes Management--last visit with ENDO was 07/24/2015  Hypoglycemic this AM--Likely from Lantus on board from dose taken at home yesterday 08/30  Novolog SSi started this AM.  If CBGs start to rise today, may consider adding back a small portion of patient's home Lantus dose     --Will follow patient during hospitalization--  Wyn Quaker RN, MSN, CDE Diabetes Coordinator Inpatient Glycemic Control Team Team Pager: [phone] (8a-5p)

## 2020-01-01 NOTE — Plan of Care (Addendum)
  Problem: Acute Rehab PT Goals(only PT should resolve) Goal: Pt Will Go Supine/Side To Sit Outcome: Progressing Flowsheets (Taken 01/01/2020 0959) Pt will go Supine/Side to Sit: Independently Goal: Patient Will Transfer Sit To/From Stand Outcome: Progressing Flowsheets (Taken 01/01/2020 0959) Patient will transfer sit to/from stand: Independently Goal: Pt Will Transfer Bed To Chair/Chair To Bed Outcome: Progressing Flowsheets (Taken 01/01/2020 0959) Pt will Transfer Bed to Chair/Chair to Bed: Independently Goal: Pt Will Ambulate Outcome: Progressing Flowsheets (Taken 01/01/2020 0959) Pt will Ambulate: . > 125 feet . with supervision   10:00 AM , 01/01/20 Karlyn Agee, SPT Physical Therapy with [facility] [phone] office   During this treatment session, the therapist was present, participating in and directing the treatment.  10:43 AM, 01/01/20 Lonell Grandchild, MPT Physical Therapist with [facility] [phone] office [phone] mobile phone

## 2020-01-01 NOTE — Consult Note (Signed)
[place] A. Merlene Laughter, MD     [email or web address]          Lauren [name] is an 39 y.o. female.   ASSESSMENT/PLAN: ACUTE SENSORY IMPAIRMENT INVOLVING THE LEFT FACIAL REGION LEFT UPPER EXTREMITY. The most likely etiology is ischemic given her risk factors. However, given her age and concern that this could be due to demyelinating disease. Consequently, MRI with contrast of the brain is recommended. Patient will be worked up extensively for ischemic stroke. The event is a small vessel event however. Dual antiplatelet agents are recommended. Most significant risk factor is a longstanding history of type 1 diabetes mellitus, obesity and previous history of coronary disease. Labs will also be obtained for Anti phospholipids antibodies, homocystine level, RPR and the vitamin B12.     This is a 39 year old right-handed white female who presents with the acute onset of numbness involving the left upper extremity and left facial region. She denies any involvement involving the left lower extremity. She denies any dysarthria or dysphagia. She reports the numbness involving the left upper extremity has improved. She reports that the numbness seems to involve the oral cavity on the left side along with the tongue. She reports a sensation as if she is going to the dentist. She has a longstanding history of diabetes mellitus type 1 although the chart says type 2. She was diagnosed at 39 years old and has been on insulin. She reports no other neurological findings in the past. There are no reports of dyspnea, chest pain or shortness of breath. The review systems otherwise negative.   GENERAL: This is a very pleasant obese female who is doing well at this time.  HEENT: The neck is supple no trauma noted.  ABDOMEN: Soft  EXTREMITIES: No edema   BACK: Normal alignment.  SKIN: Normal by inspection.    MENTAL STATUS: Alert and oriented. Speech, language and cognition are generally  intact. Judgment and insight normal.   CRANIAL NERVES: Pupils are equal, round and reactive to light and accommodation; extraocular movements are full, there is no significant nystagmus; upper and lower facial muscles are normal in strength and symmetric, there is no flattening of the nasolabial folds; tongue is midline; uvula is midline; shoulder elevation is normal.  MOTOR: Normal tone, bulk and strength; no pronator drift. There is no drift of the upper lower extremities.  COORDINATION: Left finger to nose is normal, right finger to nose is normal, No rest tremor; no intention tremor; no postural tremor; no bradykinesia.  REFLEXES: Deep tendon reflexes are symmetrical and normal. Plantar responses are flexor bilaterally.   SENSATION: Normal to light touch and temperature. There is impaired sensation to light touch and temperature left lower facial region.    NIH stroke scale scale 1.    Blood pressure 128/84, pulse 89, temperature 98.4 F (36.9 C), resp. rate 14, height 5\' 5"  (1.651 m), last menstrual period 12/28/2019, SpO2 96 %.  Past Medical History:  Diagnosis Date  . Diabetes mellitus without complication ([place])   . Myocardial infarction ([employer])   . Renal disorder    stage 3 kidney disease    Past Surgical History:  Procedure Laterality Date  . Cardiac catherization    . CHOLECYSTECTOMY    . TUBAL LIGATION      Family History  Problem Relation Age of Onset  . Thyroid disease Mother   . Hypertension Mother   . Hyperlipidemia Mother   . Hyperlipidemia Father   . Hypertension Father  Social History:  reports that she has quit smoking. Her smoking use included cigarettes. She smoked 1.00 pack per day. She has never used smokeless tobacco. She reports that she does not drink alcohol and does not use drugs.  Allergies:  Allergies  Allergen Reactions  . Wellbutrin [Bupropion] Hives  . Ciprofloxacin Hcl Hives and Rash    Hives/rash at injection site   . Tape Rash      Medications: Prior to Admission medications   Medication Sig Start Date End Date Taking? Authorizing Provider  aspirin 81 MG chewable tablet Chew 324 mg by mouth once.   Yes [provider]  furosemide (LASIX) 40 MG tablet Take 40 mg by mouth daily as needed for fluid.    Yes [provider]  glucagon (GLUCAGON EMERGENCY) 1 MG injection Inject 1 mg into the vein once as needed. 04/10/15  Yes Nida, Marella Chimes, MD  insulin lispro (HUMALOG) 100 UNIT/ML injection Inject 5-10 Units into the skin 3 (three) times daily before meals.   Yes [provider]  LANTUS SOLOSTAR 100 UNIT/ML Solostar Pen INJECT 36 UNITS SUBCUTANEOUSLY EVERY MORNING. Patient taking differently: Inject 62 Units into the skin in the morning.  12/22/15  Yes Nida, Marella Chimes, MD  levothyroxine (SYNTHROID) 175 MCG tablet Take 175 mcg by mouth daily before breakfast.  08/12/16  Yes [provider]  pantoprazole (PROTONIX) 40 MG tablet Take 40 mg by mouth daily as needed (for acid reflux).    Yes [provider]  potassium chloride SA (K-DUR,KLOR-CON) 20 MEQ tablet Take 20 mEq by mouth daily as needed (When taking Lasix (Furosemide)).    Yes [provider]    Scheduled Meds: .  stroke: mapping our early stages of recovery book   Does not apply Once  . aspirin  324 mg Oral Once  . aspirin  324 mg Oral Once  . enoxaparin (LOVENOX) injection  40 mg Subcutaneous Q24H  . insulin aspart  0-15 Units Subcutaneous TID WC  . insulin aspart  0-5 Units Subcutaneous QHS  . insulin glargine  20 Units Subcutaneous Daily  . levothyroxine  175 mcg Oral QAC breakfast   Continuous Infusions: . sodium chloride    . sodium chloride 75 mL/hr at 12/31/19 2221  . sodium chloride     PRN Meds:.acetaminophen **OR** acetaminophen (TYLENOL) oral liquid 160 mg/5 mL **OR** acetaminophen, pantoprazole, senna-docusate     Results for orders placed or performed during the hospital  encounter of 12/31/19 (from the past 48 hour(s))  SARS Coronavirus 2 by RT PCR (hospital order, performed in [facility] hospital lab) Nasopharyngeal Nasopharyngeal Swab     Status: None   Collection Time: 12/31/19 12:39 PM   Specimen: Nasopharyngeal Swab  Result Value Ref Range   SARS Coronavirus 2 NEGATIVE NEGATIVE    Comment: (NOTE) SARS-CoV-2 target nucleic acids are NOT DETECTED.  The SARS-CoV-2 RNA is generally detectable in upper and lower respiratory specimens during the acute phase of infection. The lowest concentration of SARS-CoV-2 viral copies this assay can detect is 250 copies / mL. A negative result does not preclude SARS-CoV-2 infection and should not be used as the sole basis for treatment or other patient management decisions.  A negative result may occur with improper specimen collection / handling, submission of specimen other than nasopharyngeal swab, presence of viral mutation(s) within the areas targeted by this assay, and inadequate number of viral copies (<250 copies / mL). A negative result must be combined with clinical observations, patient  history, and epidemiological information.  Fact Sheet for Patients:   StrictlyIdeas.no  Fact Sheet for Healthcare Providers: BankingDealers.co.za  This test is not yet approved or  cleared by the [place] FDA and has been authorized for detection and/or diagnosis of SARS-CoV-2 by FDA under an Emergency Use Authorization (EUA).  This EUA will remain in effect (meaning this test can be used) for the duration of the COVID-19 declaration under Section 564(b)(1) of the Act, 21 U.S.C. section 360bbb-3(b)(1), unless the authorization is terminated or revoked sooner.  Performed at [facility], [address], [place], [place] [address]   Basic metabolic panel     Status: Abnormal   Collection Time: 12/31/19  4:20 PM  Result Value Ref Range   Sodium 139 135 - 145 mmol/L     Potassium 3.9 3.5 - 5.1 mmol/L   Chloride 107 98 - 111 mmol/L   CO2 23 22 - 32 mmol/L   Glucose, Bld 168 (H) 70 - 99 mg/dL    Comment: Glucose reference range applies only to samples taken after fasting for at least 8 hours.   BUN 27 (H) 6 - 20 mg/dL   Creatinine, Ser 1.80 (H) 0.44 - 1.00 mg/dL   Calcium 8.5 (L) 8.9 - 10.3 mg/dL   GFR calc non Af Amer 35 (L) >60 mL/min   GFR calc Af Amer 41 (L) >60 mL/min   Anion gap 9 5 - 15    Comment: Performed at [facility], [address], [place], [place] [address]  CBC     Status: Abnormal   Collection Time: 12/31/19  4:20 PM  Result Value Ref Range   WBC 14.9 (H) 4.0 - 10.5 K/uL   RBC 4.40 3.87 - 5.11 MIL/uL   Hemoglobin 12.7 12.0 - 15.0 g/dL   HCT 39.6 36 - 46 %   MCV 90.0 80.0 - 100.0 fL   MCH 28.9 26.0 - 34.0 pg   MCHC 32.1 30.0 - 36.0 g/dL   RDW 13.2 11.5 - 15.5 %   Platelets 439 (H) 150 - 400 K/uL    Comment: REPEATED TO VERIFY PLATELET COUNT CONFIRMED BY SMEAR SPECIMEN CHECKED FOR CLOTS    nRBC 0.0 0.0 - 0.2 %    Comment: Performed at [facility], [address], [place], [place] [address]  Troponin I (High Sensitivity)     Status: Abnormal   Collection Time: 12/31/19  4:20 PM  Result Value Ref Range   Troponin I (High Sensitivity) 413 (HH) <18 ng/L    Comment: CRITICAL RESULT CALLED TO, READ BACK BY AND VERIFIED WITH: KENDRICK,J ON 12/31/19 AT 1715 BY LOY,C (NOTE) Elevated high sensitivity troponin I (hsTnI) values and significant  changes across serial measurements may suggest ACS but many other  chronic and acute conditions are known to elevate hsTnI results.  Refer to the Links section for chest pain algorithms and additional  guidance. Performed at [facility], [address] [place],  [address]   POC urine preg, ED     Status: None   Collection Time: 12/31/19  5:47 PM  Result Value Ref Range   Preg Test, Ur NEGATIVE NEGATIVE    Comment:        THE SENSITIVITY OF THIS METHODOLOGY IS >24 mIU/mL    Troponin I (High Sensitivity)     Status: Abnormal   Collection Time: 12/31/19 10:21 PM  Result Value Ref Range   Troponin I (High Sensitivity) 367 (HH) <18 ng/L    Comment: CRITICAL VALUE  NOTED.  VALUE IS CONSISTENT WITH PREVIOUSLY REPORTED AND CALLED VALUE. Performed at [facility], [address], [place], [place] [address]   Lipid panel     Status: Abnormal   Collection Time: 01/01/20  3:19 AM  Result Value Ref Range   Cholesterol 193 0 - 200 mg/dL   Triglycerides 416 (H) <150 mg/dL   HDL 25 (L) >40 mg/dL   Total CHOL/HDL Ratio 7.7 RATIO   VLDL UNABLE TO CALCULATE IF TRIGLYCERIDE OVER 400 mg/dL 0 - 40 mg/dL   LDL Cholesterol UNABLE TO CALCULATE IF TRIGLYCERIDE OVER 400 mg/dL 0 - 99 mg/dL    Comment:        Total Cholesterol/HDL:CHD Risk Coronary Heart Disease Risk Table                     Men   Women  1/2 Average Risk   3.4   3.3  Average Risk       5.0   4.4  2 X Average Risk   9.6   7.1  3 X Average Risk  23.4   11.0        Use the calculated Patient Ratio above and the CHD Risk Table to determine the patient's CHD Risk.        ATP III CLASSIFICATION (LDL):  <100     mg/dL   Optimal  100-129  mg/dL   Near or Above                    Optimal  130-159  mg/dL   Borderline  160-189  mg/dL   High  >190     mg/dL   Very High Performed at [facility] [place], [place] [address]   Basic metabolic panel     Status: Abnormal   Collection Time: 01/01/20  3:19 AM  Result Value Ref Range   Sodium 141 135 - 145 mmol/L   Potassium 4.1 3.5 - 5.1 mmol/L   Chloride 110 98 - 111 mmol/L   CO2 21 (L) 22 - 32 mmol/L   Glucose, Bld 85 70 - 99 mg/dL    Comment: Glucose reference range applies only to samples taken after fasting for at least 8 hours.   BUN 24 (H) 6 - 20 mg/dL   Creatinine, Ser 1.62 (H) 0.44 - 1.00 mg/dL   Calcium 8.2 (L) 8.9 - 10.3 mg/dL   GFR calc non Af Amer 40 (L) >60 mL/min   GFR calc Af Amer 47 (L) >60 mL/min   Anion gap 10 5 - 15    Comment:  Performed at [facility], [address] [place], [place] [address]  CBC     Status: Abnormal   Collection Time: 01/01/20  3:19 AM  Result Value Ref Range   WBC 10.8 (H) 4.0 - 10.5 K/uL   RBC 4.00 3.87 - 5.11 MIL/uL   Hemoglobin 11.6 (L) 12.0 - 15.0 g/dL   HCT 36.6 36 - 46 %   MCV 91.5 80.0 - 100.0 fL   MCH 29.0 26.0 - 34.0 pg   MCHC 31.7 30.0 - 36.0 g/dL   RDW 13.3 11.5 - 15.5 %   Platelets 374 150 - 400 K/uL   nRBC 0.0 0.0 - 0.2 %    Comment: Performed at [facility], [address], [place], [place] [address]    Studies/Results:  BRAIN MRI MRA MRI HEAD FINDINGS  Brain: Focal restricted diffusion involving the right mid brain measuring  8 mm (5:13) in AP dimension. Scattered and confluent periventricular and subcortical white matter T2/FLAIR hyperintense foci. No midline shift, ventriculomegaly or extra-axial fluid collection. No mass lesion.  Vascular: Please see MRA head.  Skull and upper cervical spine: Normal marrow signal.  Sinuses/Orbits: Normal orbits. Clear paranasal sinuses. No mastoid effusion.  Other: None.  MRA HEAD FINDINGS  Please note that time-of-flight imaging is degraded by motion artifact, limiting evaluation.  Anterior circulation: Moderate left and mild right supraclinoid ICA narrowing. Irregularity of the left greater than right intracavernous ICAs may reflect atheromatous disease versus motion artifact. No significant proximal occlusion, aneurysm, or vascular malformation. The anterior and middle cerebral arteries are patent.  Posterior circulation: Dominant left vertebral artery. No significant stenosis, proximal occlusion, aneurysm, or vascular malformation. Patent bilateral V4 segments and basilar artery. Patent bilateral PCAs. The proximal bilateral superior cerebellar arteries are patent, not well visualized distally secondary to motion artifact.  Venous sinuses: No evidence of thrombosis.  Anatomic variants: Left PCOM  hypoplasia. Fetal origin of the right PCA.  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  Supratentorial white matter FLAIR hyperintensities. Differential includes demyelination versus chronic microvascular ischemic changes.  Motion artifact limits evaluation on time-of-flight. Moderate left and mild right supraclinoid ICA narrowing.  No large vessel occlusion.    THE BRAIN MRI SCAN AND MRA SCAN ARE REVIEWED IN PERSON.  There is the faint increased signal seen on DWI involving midbrain area on the right side. There is significant periventricular and deep white matter signal seen on FLAIR imaging. This periventricular area is mild the but significantly increased for age. There are several deep white matter lesions. The findings is nonspecific without no evidence involving the corpus callosum or the pons.  MRA seems unremarkable.    TTE 1. Left ventricular ejection fraction, by estimation, is 55 to 60%. The  left ventricle has normal function. The left ventricle demonstrates  regional wall motion abnormalities (see scoring diagram/findings for  description). There is mild left ventricular  hypertrophy. Left ventricular diastolic parameters were normal. There is  moderate hypokinesis of the left ventricular, apical. There is sluggish  apical flow without clear formed thrombus.  2. Right ventricular systolic function is normal. The right ventricular  size is normal. There is mildly elevated pulmonary artery systolic  pressure.  3. The mitral valve is normal in structure. Trivial mitral valve  regurgitation. No evidence of mitral stenosis.  4. The inferior vena cava is normal in size with greater than 50%  respiratory variability, suggesting right atrial pressure of 3 mmHg.  5. The aortic valve is tricuspid. Aortic valve regurgitation is not  visualized. No aortic stenosis is present.    Lourene [place] A. Merlene Laughter, M.D.  Diplomate, Tax adviser of Psychiatry and  Neurology ( Neurology). 01/01/2020, 7:59 AM

## 2020-01-01 NOTE — Consult Note (Addendum)
Cardiology Consult    Patient ID: Lauren [name]; [id number]; May 15, 1980   Admit date: 12/31/2019 Date of Consult: 01/01/2020  Primary Care Provider: Lemmie Evens, MD Primary Cardiologist: New to [facility] - Dr. Harl Bowie  Patient Profile    Lauren [name] is a 39 y.o. female with past medical history of MI (cath 5+ years ago in [name] which showed no blockages per her report), IDDM, GERD, Stage 3 CKD and Hypothyroidism who is being seen today for the evaluation of chest pain at the request of Dr. Carles Collet.   History of Present Illness    Lauren [name] presented to [facility] ED on 12/31/2019 for evaluation of numbness along the left side of her face and left arm. She also reported occasional episodes of chest discomfort.  In talking with the patient today, she reports developing a numbness sensation along the left side of her face on Sunday morning which she reports felt like she "received lidocaine" and her teeth and gums were numb as well. Symptoms were persistent throughout the day and she called her PCP to try to arrange a visit for the following morning. On Monday morning, her numbness radiated into her left hand and fingers and she reports associated chest pressure when walking outside. Her chest pain lasted for approximately 30 minutes and then resolved. She denies any recurrent pain since. 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 No recent orthopnea, PND or lower extremity edema. She does report episodes of visual disturbances during which she can be out at the store and develop dizziness and blurred vision and has to focus on an object for this to improve. Initially thought it might be secondary to her glasses but she has had the same pair for over 1 year. Does report being under increased stress as her step-mom is currently hospitalized for COVID-19 in [facility] and is on a ventilator.    Initial labs showed WBC 14.9, Hgb 12.7, platelets 439, Na+ 139, K+ 3.9 and creatinine 1.80 (basekube 1.30 by review of labs from 2018). Initial HS Troponin 413 with repeat of 367. FLP shows total cholesterol of 193, triglycerides 416 and HDL 25. Hgb A1c pending. COVID negative.  EKG shows sinus tachycardia, HR 110 with inferior q-waves and anterolateral infarct pattern. CXR with no active cardiopulmonary disease. 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    Past Medical History:  Diagnosis Date  . Diabetes mellitus without complication ([place])   . Myocardial infarction ([place])   . Renal disorder    stage 3 kidney disease    Past Surgical History:  Procedure Laterality Date  . Cardiac catherization    . CHOLECYSTECTOMY    . TUBAL LIGATION       Home Medications:  Prior to Admission medications   Medication Sig Start Date End Date Taking? Authorizing Provider  aspirin 81 MG chewable tablet Chew 324 mg by mouth once.   Yes [provider]  furosemide (LASIX) 40 MG tablet Take 40 mg by mouth daily as needed for fluid.    Yes [provider]  glucagon (GLUCAGON EMERGENCY) 1 MG injection Inject 1 mg into the vein once as needed. 04/10/15  Yes Nida, Marella Chimes, MD  insulin lispro (HUMALOG) 100 UNIT/ML injection Inject 5-10 Units into the skin 3 (  three) times daily before meals.   Yes [provider]  LANTUS SOLOSTAR 100 UNIT/ML Solostar Pen INJECT 36 UNITS SUBCUTANEOUSLY EVERY MORNING. Patient taking differently: Inject 62 Units into the skin in the morning.  12/22/15  Yes Nida, Marella Chimes, MD  levothyroxine (SYNTHROID) 175 MCG tablet Take 175 mcg by mouth daily before breakfast.  08/12/16  Yes [provider]  pantoprazole (PROTONIX) 40 MG tablet Take 40 mg by mouth daily as needed (for  acid reflux).    Yes [provider]  potassium chloride SA (K-DUR,KLOR-CON) 20 MEQ tablet Take 20 mEq by mouth daily as needed (When taking Lasix (Furosemide)).    Yes [provider]    Inpatient Medications: Scheduled Meds: .  stroke: mapping our early stages of recovery book   Does not apply Once  . aspirin  324 mg Oral Once  . aspirin  324 mg Oral Once  . atorvastatin  40 mg Oral Daily  . enoxaparin (LOVENOX) injection  40 mg Subcutaneous Q24H  . fenofibrate  160 mg Oral Daily  . insulin aspart  0-15 Units Subcutaneous TID WC  . insulin aspart  0-5 Units Subcutaneous QHS  . levothyroxine  175 mcg Oral QAC breakfast   Continuous Infusions: . sodium chloride    . sodium chloride Stopped (01/01/20 0850)  . sodium chloride 75 mL/hr at 01/01/20 0847   PRN Meds: acetaminophen **OR** acetaminophen (TYLENOL) oral liquid 160 mg/5 mL **OR** acetaminophen, pantoprazole, senna-docusate  Allergies:    Allergies  Allergen Reactions  . Wellbutrin [Bupropion] Hives  . Ciprofloxacin Hcl Hives and Rash    Hives/rash at injection site   . Tape Rash    Social History:   Social History   Socioeconomic History  . Marital status: Single    Spouse name: Not on file  . Number of children: Not on file  . Years of education: Not on file  . Highest education level: Not on file  Occupational History  . Not on file  Tobacco Use  . Smoking status: Former Smoker    Packs/day: 1.00    Types: Cigarettes  . Smokeless tobacco: Never Used  Vaping Use  . Vaping Use: Never used  Substance and Sexual Activity  . Alcohol use: No    Alcohol/week: 0.0 standard drinks  . Drug use: No  . Sexual activity: Not on file  Other Topics Concern  . Not on file  Social History Narrative  . Not on file   Social Determinants of Health   Financial Resource Strain:   . Difficulty of Paying Living Expenses: Not on file  Food Insecurity:   . Worried About Charity fundraiser in the Last  Year: Not on file  . Ran Out of Food in the Last Year: Not on file  Transportation Needs:   . Lack of Transportation (Medical): Not on file  . Lack of Transportation (Non-Medical): Not on file  Physical Activity:   . Days of Exercise per Week: Not on file  . Minutes of Exercise per Session: Not on file  Stress:   . Feeling of Stress : Not on file  Social Connections:   . Frequency of Communication with Friends and Family: Not on file  . Frequency of Social Gatherings with Friends and Family: Not on file  . Attends Religious Services: Not on file  . Active Member of Clubs or Organizations: Not on file  . Attends Archivist Meetings: Not on file  . Marital Status: Not  on file  Intimate Partner Violence:   . Fear of Current or Ex-Partner: Not on file  . Emotionally Abused: Not on file  . Physically Abused: Not on file  . Sexually Abused: Not on file     Family History:    Family History  Problem Relation Age of Onset  . Thyroid disease Mother   . Hypertension Mother   . Hyperlipidemia Mother   . CAD Mother   . CVA Mother   . Hyperlipidemia Father   . Hypertension Father   . CAD Father       Review of Systems    General:  No chills, fever, night sweats or weight changes.  Cardiovascular:  No dyspnea on exertion, edema, orthopnea, palpitations, paroxysmal nocturnal dyspnea. Positive for chest pain.  Dermatological: No rash, lesions/masses Respiratory: No cough, dyspnea Urologic: No hematuria, dysuria Abdominal:   No vomiting, diarrhea, bright red blood per rectum, melena, or hematemesis. Positive for nausea.  Neurologic: Positive for visual changes and paraesthesias.   All other systems reviewed and are otherwise negative except as noted above.  Physical Exam/Data    Vitals:   01/01/20 0100 01/01/20 0200 01/01/20 0300 01/01/20 0600  BP: (!) 158/79  131/71 128/84  Pulse: (!) 103 (!) 101 93 89  Resp: 16 14 14 14   Temp:      SpO2: 98% 98% 93% 96%  Height:        No intake or output data in the 24 hours ending 01/01/20 0900 There were no vitals filed for this visit. Body mass index is 38.27 kg/m.   General: Pleasant, Caucasian female appearing in NAD Psych: Normal affect. Neuro: Alert and oriented X 3. Moves all extremities spontaneously. HEENT: Normal  Neck: Supple without bruits or JVD. Lungs:  Resp regular and unlabored, CTA without wheezing or rales. Heart: RRR no s3, s4, or murmurs. Abdomen: Soft, non-tender, non-distended, BS + x 4.  Extremities: No clubbing, cyanosis or lower extremity edema. DP/PT/Radials 2+ and equal bilaterally.   EKG:  The EKG was personally reviewed and demonstrates: Sinus tachycardia, HR 110 with inferior q-waves and anterolateral infarct pattern. Telemetry:  Telemetry was personally reviewed and demonstrates: Sinus rhythm, HR in 80's to low-100's.    Labs/Studies     Relevant CV Studies:  Echocardiogram: Pending  Lower Extremity Dopplers: 11/2019 IMPRESSION: No evidence of deep venous thrombosis.  Laboratory Data:  Chemistry Recent Labs  Lab 12/31/19 1620 01/01/20 0319  NA 139 141  K 3.9 4.1  CL 107 110  CO2 23 21*  GLUCOSE 168* 85  BUN 27* 24*  CREATININE 1.80* 1.62*  CALCIUM 8.5* 8.2*  GFRNONAA 35* 40*  GFRAA 41* 47*  ANIONGAP 9 10    No results for input(s): PROT, ALBUMIN, AST, ALT, ALKPHOS, BILITOT in the last 168 hours. Hematology Recent Labs  Lab 12/31/19 1620 01/01/20 0319  WBC 14.9* 10.8*  RBC 4.40 4.00  HGB 12.7 11.6*  HCT 39.6 36.6  MCV 90.0 91.5  MCH 28.9 29.0  MCHC 32.1 31.7  RDW 13.2 13.3  PLT 439* 374   Cardiac EnzymesNo results for input(s): TROPONINI in the last 168 hours. No results for input(s): TROPIPOC in the last 168 hours.  BNPNo results for input(s): BNP, PROBNP in the last 168 hours.  DDimer No results for input(s): DDIMER in the last 168 hours.  Radiology/Studies:  MR ANGIO HEAD WO CONTRAST  Result Date: 12/31/2019 CLINICAL DATA:  Neuro  deficit EXAM: MRI HEAD WITHOUT CONTRAST MRA HEAD WITHOUT CONTRAST TECHNIQUE:  Multiplanar, multiecho pulse sequences of the brain and surrounding structures were obtained without intravenous contrast. Angiographic images of the head were obtained using MRA technique without contrast. COMPARISON:  None. FINDINGS: Some image sequences are degraded by motion artifact. MRI HEAD FINDINGS Brain: Focal restricted diffusion involving the right mid brain measuring 8 mm (5:13) in AP dimension. Scattered and confluent periventricular and subcortical white matter T2/FLAIR hyperintense foci. No midline shift, ventriculomegaly or extra-axial fluid collection. No mass lesion. Vascular: Please see MRA head. Skull and upper cervical spine: Normal marrow signal. Sinuses/Orbits: Normal orbits. Clear paranasal sinuses. No mastoid effusion. Other: None. MRA HEAD FINDINGS Please note that time-of-flight imaging is degraded by motion artifact, limiting evaluation. Anterior circulation: Moderate left and mild right supraclinoid ICA narrowing. Irregularity of the left greater than right intracavernous ICAs may reflect atheromatous disease versus motion artifact. No significant proximal occlusion, aneurysm, or vascular malformation. The anterior and middle cerebral arteries are patent. Posterior circulation: Dominant left vertebral artery. No significant stenosis, proximal occlusion, aneurysm, or vascular malformation. Patent bilateral V4 segments and basilar artery. Patent bilateral PCAs. The proximal bilateral superior cerebellar arteries are patent, not well visualized distally secondary to motion artifact. Venous sinuses: No evidence of thrombosis. Anatomic variants: Left PCOM hypoplasia. Fetal origin of the right PCA. 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 Supratentorial white matter FLAIR hyperintensities. Differential includes demyelination versus chronic microvascular ischemic changes. Motion artifact limits  evaluation on time-of-flight. Moderate left and mild right supraclinoid ICA narrowing. No large vessel occlusion. These results were called by telephone at the time of interpretation on 12/31/2019 at 5:24 pm to provider Lauren [name] , who verbally acknowledged these results. Electronically Signed   By: Primitivo Gauze M.D.   On: 12/31/2019 17:46   MR BRAIN WO CONTRAST  Result Date: 12/31/2019 CLINICAL DATA:  Neuro deficit EXAM: MRI HEAD WITHOUT CONTRAST MRA HEAD WITHOUT CONTRAST TECHNIQUE: Multiplanar, multiecho pulse sequences of the brain and surrounding structures were obtained without intravenous contrast. Angiographic images of the head were obtained using MRA technique without contrast. COMPARISON:  None. FINDINGS: Some image sequences are degraded by motion artifact. MRI HEAD FINDINGS Brain: Focal restricted diffusion involving the right mid brain measuring 8 mm (5:13) in AP dimension. Scattered and confluent periventricular and subcortical white matter T2/FLAIR hyperintense foci. No midline shift, ventriculomegaly or extra-axial fluid collection. No mass lesion. Vascular: Please see MRA head. Skull and upper cervical spine: Normal marrow signal. Sinuses/Orbits: Normal orbits. Clear paranasal sinuses. No mastoid effusion. Other: None. MRA HEAD FINDINGS Please note that time-of-flight imaging is degraded by motion artifact, limiting evaluation. Anterior circulation: Moderate left and mild right supraclinoid ICA narrowing. Irregularity of the left greater than right intracavernous ICAs may reflect atheromatous disease versus motion artifact. No significant proximal occlusion, aneurysm, or vascular malformation. The anterior and middle cerebral arteries are patent. Posterior circulation: Dominant left vertebral artery. No significant stenosis, proximal occlusion, aneurysm, or vascular malformation. Patent bilateral V4 segments and basilar artery. Patent bilateral PCAs. The proximal bilateral superior  cerebellar arteries are patent, not well visualized distally secondary to motion artifact. Venous sinuses: No evidence of thrombosis. Anatomic variants: Left PCOM hypoplasia. Fetal origin of the right PCA. 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 Supratentorial white matter FLAIR hyperintensities. Differential includes demyelination versus chronic microvascular ischemic changes. Motion artifact limits evaluation on time-of-flight. Moderate left and mild right supraclinoid ICA narrowing. No large vessel occlusion. These results were called by telephone at the time of interpretation on 12/31/2019 at 5:24 pm to  provider Lauren [name] , who verbally acknowledged these results. Electronically Signed   By: Primitivo Gauze M.D.   On: 12/31/2019 17:46   DG Chest Port 1 View  Result Date: 12/31/2019 CLINICAL DATA:  Chest pain EXAM: PORTABLE CHEST 1 VIEW COMPARISON:  Multiple priors, most recent 09/13/2016 FINDINGS: The heart size and mediastinal contours are within normal limits. Both lungs are clear. No pleural effusion. No pneumothorax. The visualized skeletal structures are unremarkable. IMPRESSION: No active disease. Electronically Signed   By: Margaretha Sheffield MD   On: 12/31/2019 13:17     Assessment & Plan    1. Elevated Troponin Values - Presented for evaluation of facial numbness which radiated into her left arm and found to have an acute right mid-brain infarct. She did experience chest pain the day of admission which occurred while walking outside and spontaneously resolved within 30 minutes. No anginal symptoms prior to this.  - She does report having an MI 5+ years ago when her glucose was greater than 700 and underwent a cardiac catheterization at that time which showed no blockages per her report and did not require any intervention.  - Initial HS Troponin 413 with repeat of 367 which is possibly due to demand ischemia in the setting of her acute CVA. EKG shows sinus  tachycardia, HR 110 with inferior q-waves and anterolateral infarct pattern. An echocardiogram is pending to assess LV function and wall motion. Would not anticipate invasive evaluation this admission in the setting of her acute CVA. She does have multiple cardiac risk factors (IDDM, HLD, family history of CAD and tobacco use) so would benefit from ischemic evaluation once further out from her acute event. If echo reassuring, would consider outpatient stress testing for initial assessment given her underlying CKD.  - Continue ASA and statin therapy. She has not been started on Heparin given her CVA.   2. HLD - FLP shows total cholesterol of 193, triglycerides 416 and HDL 25. Direct LDL pending. She has been started on Atorvastatin 40mg  daily.   3. Type 1 DM - Hgb A1c elevated to 10.3. Further management per admitting team.   4. Stage 3 CKD - Creatinine elevated to 1.80 on admission, improved to 1.62 today. No recent labs available for comparison but creatinine was at 1.3 in 2018.  5. 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 - Neurology has been consulted by the admitting team. Carotid dopplers and echo pending. She has been started on ASA and statin therapy.     For questions or updates, please contact [place] Please consult [email or web address] for contact info under Cardiology/STEMI.  Signed, Erma Heritage, PA-C 01/01/2020, 9:00 AM Pager: [phone]  Attending note Patient seen and discussed with PA Lauren [name], I agree with her documentation. 39 yo female history of DM2, CKD 3 admitted with left sided numbness, found with acute CVA on MRI. Did not get TPA. Also with episodes of chest pains for which cardiology is consulted for.  Reports left sided facial numbness started Sunday, later extended into her left  arm. On Monday started having left leg numbness, isolated episode of chest pressure while walking last 30 minutes, worst with breating, self resovled without recurrence.     K 3.9 Cr 1.8 BUN 27 WBC 14.9 Plt 439 HgbA1c 10.3 LDL 85 hstrop 413-->367--> EKG SR, no acute ischemic changes, inferior and anterolateral Qwaves Echo pending  COVID neg CXR no acute process MRI brain: acute right midbrain infarct  Mild trop elevation in setting of acute CVA, EKG without acute ischemic changes. Echo is pending. Isolated episode of chest pain in setting of her CVA symptoms without recurrence. Follow up echo. In general try to avoid invasive testing immediate post CVA due to risk of bleeding complications, would anticipate likely outpatient stress testing once she has had some time to recover from her acute CVA. Continue home ASA, started on statin here.    Carlyle Dolly MD

## 2020-01-01 NOTE — Progress Notes (Signed)
*  PRELIMINARY RESULTS* Echocardiogram 2D Echocardiogram with definity has been performed.  Leavy Cella 01/01/2020, 10:33 AM

## 2020-01-01 NOTE — Progress Notes (Signed)
PROGRESS NOTE  Lauren [name] RNH:[id number] DOB: Aug 25, 1980 DOA: 12/31/2019 PCP: Lemmie Evens, MD  Brief History:  39 year old female with a history of coronary disease, MI, diabetes mellitus type 2, CKD stage III, tobacco abuse presenting with left hand and left face numbness that began on 12/30/2019.  She stated that it was constant.  On 12/31/2019, the patient developed some numbness in her left leg as well as some chest discomfort after she took an approximately quarter mile walk.  She denied any shortness of breath, coughing, hemoptysis, fevers, chills.  She denies any focal unilateral weakness.  There is no dysarthria or dysphagia.  She denies any visual disturbance or headache. In the emergency department, the patient was afebrile hemodynamic stable with oxygen saturations 95% room air.  MRI of the brain showed an acute right midbrain infarct.  MRA was negative for LVO.  Neurology was consulted to assist with management.  BMP showed serum creatinine of 1.80 which is above her usual baseline.  WBC 14.9, hemoglobin 12.7, platelets 274,000.  Troponins were 413>>> 367.  Cardiology was also consulted to assist with management.  Assessment/Plan: Acute ischemic stroke -Appreciate Neurology Consult -PT/OT evaluation -Speech therapy eval -CT brain--n/a -MRI brain--acute R-midbrain infarct -MRA brain--no LVO -Carotid Duplex-- -Echo-- -LDL--unable to calculate -HbA1C-- -Antiplatelet--ASA 81 mg  Acute on chronic renal failure--CKD stage III -Baseline creatinine 1.1-1.3 -Serum creatinine peaked 1.80 -Continue IV fluids another 24 hours -Renal ultrasound -Obtain urinalysis  Chest pain/elevated troponin/coronary artery disease -Cardiology consult -Troponin 417>>> 367 -Echo -continue ASA  Mixed Hyperlipidemia -start statin -start fenofibrate  Diabetes mellitus type 1 -Hemoglobin A1c -Start reduced dose Lantus -NovoLog sliding scale  Hypothyroidism -Continue  levothyroxine  Tobacco abuse -Tobacco cessation discussed      Status is: Inpatient  Remains inpatient appropriate because:IV treatments appropriate due to intensity of illness or inability to take PO   Dispo: The patient is from: Home              Anticipated d/c is to: Home              Anticipated d/c date is: 1 day              Patient currently is not medically stable to d/c.        Family Communication:   Family at bedside  Consultants:    Code Status:  FULL / DNR  DVT Prophylaxis:  [place] Heparin / [place] Lovenox   Procedures: As Listed in Progress Note Above  Antibiotics: None       Subjective: She states that her left hand and left leg numbness however improved this morning but she still has some left facial numbness.  She denies any fevers, chills, chest pain, shortness of breath, nausea, vomiting, diarrhea, coughing, hemoptysis.  Objective: Vitals:   01/01/20 0100 01/01/20 0200 01/01/20 0300 01/01/20 0600  BP: (!) 158/79  131/71 128/84  Pulse: (!) 103 (!) 101 93 89  Resp: 16 14 14 14   Temp:      SpO2: 98% 98% 93% 96%  Height:       No intake or output data in the 24 hours ending 01/01/20 0747 Weight change:  Exam:   General:  Pt is alert, follows commands appropriately, not in acute distress  HEENT: No icterus, No thrush, No neck mass, [place]/AT  Cardiovascular: RRR, S1/S2, no rubs, no gallops  Respiratory: CTA bilaterally, no wheezing, no crackles, no rhonchi  Abdomen: Soft/+BS, non  tender, non distended, no guarding  Extremities: No edema, No lymphangitis, No petechiae, No rashes, no synovitis  Neuro:  CN II-XII intact, strength 4/5 in RUE, RLE, strength 4/5 LUE, LLE; sensation intact bilateral; no dysmetria; babinski equivocal     Data Reviewed: I have personally reviewed following labs and imaging studies Basic Metabolic Panel: Recent Labs  Lab 12/31/19 1620 01/01/20 0319  NA 139 141  K 3.9 4.1  CL 107 110  CO2 23 21*    GLUCOSE 168* 85  BUN 27* 24*  CREATININE 1.80* 1.62*  CALCIUM 8.5* 8.2*   Liver Function Tests: No results for input(s): AST, ALT, ALKPHOS, BILITOT, PROT, ALBUMIN in the last 168 hours. No results for input(s): LIPASE, AMYLASE in the last 168 hours. No results for input(s): AMMONIA in the last 168 hours. Coagulation Profile: No results for input(s): INR, PROTIME in the last 168 hours. CBC: Recent Labs  Lab 12/31/19 1620 01/01/20 0319  WBC 14.9* 10.8*  HGB 12.7 11.6*  HCT 39.6 36.6  MCV 90.0 91.5  PLT 439* 374   Cardiac Enzymes: No results for input(s): CKTOTAL, CKMB, CKMBINDEX, TROPONINI in the last 168 hours. BNP: Invalid input(s): POCBNP CBG: No results for input(s): GLUCAP in the last 168 hours. HbA1C: No results for input(s): HGBA1C in the last 72 hours. Urine analysis:    Component Value Date/Time   COLORURINE YELLOW 09/13/2016 2041   APPEARANCEUR CLEAR 09/13/2016 2041   LABSPEC 1.013 09/13/2016 2041   PHURINE 5.0 09/13/2016 2041   GLUCOSEU NEGATIVE 09/13/2016 2041   HGBUR SMALL (A) 09/13/2016 2041   BILIRUBINUR NEGATIVE 09/13/2016 2041   KETONESUR NEGATIVE 09/13/2016 2041   PROTEINUR 100 (A) 09/13/2016 2041   UROBILINOGEN 0.2 06/23/2014 1227   NITRITE NEGATIVE 09/13/2016 2041   LEUKOCYTESUR NEGATIVE 09/13/2016 2041   Sepsis Labs: @LABRCNTIP (procalcitonin:4,lacticidven:4) ) Recent Results (from the past 240 hour(s))  SARS Coronavirus 2 by RT PCR (hospital order, performed in [facility] hospital lab) Nasopharyngeal Nasopharyngeal Swab     Status: None   Collection Time: 12/31/19 12:39 PM   Specimen: Nasopharyngeal Swab  Result Value Ref Range Status   SARS Coronavirus 2 NEGATIVE NEGATIVE Final    Comment: (NOTE) SARS-CoV-2 target nucleic acids are NOT DETECTED.  The SARS-CoV-2 RNA is generally detectable in upper and lower respiratory specimens during the acute phase of infection. The lowest concentration of SARS-CoV-2 viral copies this assay can  detect is 250 copies / mL. A negative result does not preclude SARS-CoV-2 infection and should not be used as the sole basis for treatment or other patient management decisions.  A negative result may occur with improper specimen collection / handling, submission of specimen other than nasopharyngeal swab, presence of viral mutation(s) within the areas targeted by this assay, and inadequate number of viral copies (<250 copies / mL). A negative result must be combined with clinical observations, patient history, and epidemiological information.  Fact Sheet for Patients:   StrictlyIdeas.no  Fact Sheet for Healthcare Providers: BankingDealers.co.za  This test is not yet approved or  cleared by the [place] FDA and has been authorized for detection and/or diagnosis of SARS-CoV-2 by FDA under an Emergency Use Authorization (EUA).  This EUA will remain in effect (meaning this test can be used) for the duration of the COVID-19 declaration under Section 564(b)(1) of the Act, 21 U.S.C. section 360bbb-3(b)(1), unless the authorization is terminated or revoked sooner.  Performed at [facility], [address] [place], [place] [address]      Scheduled  Meds: .  stroke: mapping our early stages of recovery book   Does not apply Once  . aspirin  324 mg Oral Once  . aspirin  324 mg Oral Once  . enoxaparin (LOVENOX) injection  40 mg Subcutaneous Q24H  . insulin aspart  0-15 Units Subcutaneous TID WC  . insulin aspart  0-5 Units Subcutaneous QHS  . insulin glargine  20 Units Subcutaneous BID  . levothyroxine  175 mcg Oral QAC breakfast   Continuous Infusions: . sodium chloride    . sodium chloride 75 mL/hr at 12/31/19 2221    Procedures/Studies: MR ANGIO HEAD WO CONTRAST  Result Date: 12/31/2019 CLINICAL DATA:  Neuro deficit EXAM: MRI HEAD WITHOUT CONTRAST MRA HEAD WITHOUT CONTRAST TECHNIQUE: Multiplanar, multiecho pulse sequences of the  brain and surrounding structures were obtained without intravenous contrast. Angiographic images of the head were obtained using MRA technique without contrast. COMPARISON:  None. FINDINGS: Some image sequences are degraded by motion artifact. MRI HEAD FINDINGS Brain: Focal restricted diffusion involving the right mid brain measuring 8 mm (5:13) in AP dimension. Scattered and confluent periventricular and subcortical white matter T2/FLAIR hyperintense foci. No midline shift, ventriculomegaly or extra-axial fluid collection. No mass lesion. Vascular: Please see MRA head. Skull and upper cervical spine: Normal marrow signal. Sinuses/Orbits: Normal orbits. Clear paranasal sinuses. No mastoid effusion. Other: None. MRA HEAD FINDINGS Please note that time-of-flight imaging is degraded by motion artifact, limiting evaluation. Anterior circulation: Moderate left and mild right supraclinoid ICA narrowing. Irregularity of the left greater than right intracavernous ICAs may reflect atheromatous disease versus motion artifact. No significant proximal occlusion, aneurysm, or vascular malformation. The anterior and middle cerebral arteries are patent. Posterior circulation: Dominant left vertebral artery. No significant stenosis, proximal occlusion, aneurysm, or vascular malformation. Patent bilateral V4 segments and basilar artery. Patent bilateral PCAs. The proximal bilateral superior cerebellar arteries are patent, not well visualized distally secondary to motion artifact. Venous sinuses: No evidence of thrombosis. Anatomic variants: Left PCOM hypoplasia. Fetal origin of the right PCA. 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 Supratentorial white matter FLAIR hyperintensities. Differential includes demyelination versus chronic microvascular ischemic changes. Motion artifact limits evaluation on time-of-flight. Moderate left and mild right supraclinoid ICA narrowing. No large vessel occlusion. These results  were called by telephone at the time of interpretation on 12/31/2019 at 5:24 pm to provider [facility] ZAMMIT , who verbally acknowledged these results. Electronically Signed   By: Primitivo Gauze M.D.   On: 12/31/2019 17:46   MR BRAIN WO CONTRAST  Result Date: 12/31/2019 CLINICAL DATA:  Neuro deficit EXAM: MRI HEAD WITHOUT CONTRAST MRA HEAD WITHOUT CONTRAST TECHNIQUE: Multiplanar, multiecho pulse sequences of the brain and surrounding structures were obtained without intravenous contrast. Angiographic images of the head were obtained using MRA technique without contrast. COMPARISON:  None. FINDINGS: Some image sequences are degraded by motion artifact. MRI HEAD FINDINGS Brain: Focal restricted diffusion involving the right mid brain measuring 8 mm (5:13) in AP dimension. Scattered and confluent periventricular and subcortical white matter T2/FLAIR hyperintense foci. No midline shift, ventriculomegaly or extra-axial fluid collection. No mass lesion. Vascular: Please see MRA head. Skull and upper cervical spine: Normal marrow signal. Sinuses/Orbits: Normal orbits. Clear paranasal sinuses. No mastoid effusion. Other: None. MRA HEAD FINDINGS Please note that time-of-flight imaging is degraded by motion artifact, limiting evaluation. Anterior circulation: Moderate left and mild right supraclinoid ICA narrowing. Irregularity of the left greater than right intracavernous ICAs may reflect atheromatous disease versus motion artifact. No significant proximal occlusion,  aneurysm, or vascular malformation. The anterior and middle cerebral arteries are patent. Posterior circulation: Dominant left vertebral artery. No significant stenosis, proximal occlusion, aneurysm, or vascular malformation. Patent bilateral V4 segments and basilar artery. Patent bilateral PCAs. The proximal bilateral superior cerebellar arteries are patent, not well visualized distally secondary to motion artifact. Venous sinuses: No evidence of  thrombosis. Anatomic variants: Left PCOM hypoplasia. Fetal origin of the right PCA. 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 Supratentorial white matter FLAIR hyperintensities. Differential includes demyelination versus chronic microvascular ischemic changes. Motion artifact limits evaluation on time-of-flight. Moderate left and mild right supraclinoid ICA narrowing. No large vessel occlusion. These results were called by telephone at the time of interpretation on 12/31/2019 at 5:24 pm to provider [facility] ZAMMIT , who verbally acknowledged these results. Electronically Signed   By: Primitivo Gauze M.D.   On: 12/31/2019 17:46   DG Chest Port 1 View  Result Date: 12/31/2019 CLINICAL DATA:  Chest pain EXAM: PORTABLE CHEST 1 VIEW COMPARISON:  Multiple priors, most recent 09/13/2016 FINDINGS: The heart size and mediastinal contours are within normal limits. Both lungs are clear. No pleural effusion. No pneumothorax. The visualized skeletal structures are unremarkable. IMPRESSION: No active disease. Electronically Signed   By: Margaretha Sheffield MD   On: 12/31/2019 13:17    Orson Eva, DO  [facility]  If 7PM-7AM, please contact night-coverage [email or web address] Password [facility] 01/01/2020, 7:47 AM   LOS: 1 day

## 2020-01-02 ENCOUNTER — Inpatient Hospital Stay (HOSPITAL_COMMUNITY): Payer: BLUE CROSS/BLUE SHIELD

## 2020-01-02 DIAGNOSIS — N179 Acute kidney failure, unspecified: Secondary | ICD-10-CM

## 2020-01-02 DIAGNOSIS — Z6839 Body mass index (BMI) 39.0-39.9, adult: Secondary | ICD-10-CM

## 2020-01-02 DIAGNOSIS — I10 Essential (primary) hypertension: Secondary | ICD-10-CM

## 2020-01-02 DIAGNOSIS — E6609 Other obesity due to excess calories: Secondary | ICD-10-CM

## 2020-01-02 DIAGNOSIS — E782 Mixed hyperlipidemia: Secondary | ICD-10-CM

## 2020-01-02 DIAGNOSIS — E039 Hypothyroidism, unspecified: Secondary | ICD-10-CM

## 2020-01-02 LAB — MAGNESIUM: Magnesium: 2 mg/dL (ref 1.7–2.4)

## 2020-01-02 LAB — URINE CULTURE: Culture: <10000 — AB

## 2020-01-02 LAB — BASIC METABOLIC PANEL
Anion gap: 10 (ref 5–15)
BUN: 21 mg/dL — ABNORMAL HIGH (ref 6–20)
CO2: 22 mmol/L (ref 22–32)
Calcium: 8.2 mg/dL — ABNORMAL LOW (ref 8.9–10.3)
Chloride: 107 mmol/L (ref 98–111)
Creatinine, Ser: 1.64 mg/dL — ABNORMAL HIGH (ref 0.44–1.00)
GFR calc Af Amer: 46 mL/min — ABNORMAL LOW (ref >60)
GFR calc non Af Amer: 40 mL/min — ABNORMAL LOW (ref >60)
Glucose, Bld: 193 mg/dL — ABNORMAL HIGH (ref 70–99)
Potassium: 4.2 mmol/L (ref 3.5–5.1)
Sodium: 139 mmol/L (ref 135–145)

## 2020-01-02 LAB — HOMOCYSTEINE: Homocysteine: 21.6 umol/L — ABNORMAL HIGH (ref 0.0–14.5)

## 2020-01-02 LAB — GLUCOSE, CAPILLARY
Glucose-Capillary: 201 mg/dL — ABNORMAL HIGH (ref 70–99)
Glucose-Capillary: 338 mg/dL — ABNORMAL HIGH (ref 70–99)

## 2020-01-02 LAB — RPR: RPR Ser Ql: NONREACTIVE

## 2020-01-02 IMAGING — MR MR HEAD W/ CM
2 of 3 series · 10 of 48 positions shown · IV contrast (gadavist)
Comparison: MRI [DATE]

CLINICAL DATA: Baseline evaluation for multiple sclerosis.

EXAM:
MRI HEAD WITH CONTRAST
TECHNIQUE: Multiplanar, multiecho pulse sequences of the brain and surrounding
structures were obtained with intravenous contrast.
CONTRAST:  10mL GADAVIST GADOBUTROL 1 MMOL/ML IV SOLN

[Series 6: T1 post-contrast · coronal · 5.0mm · 0.34mm/px · 6 of 28 slices shown (1 of 2)]
[im 1/28]
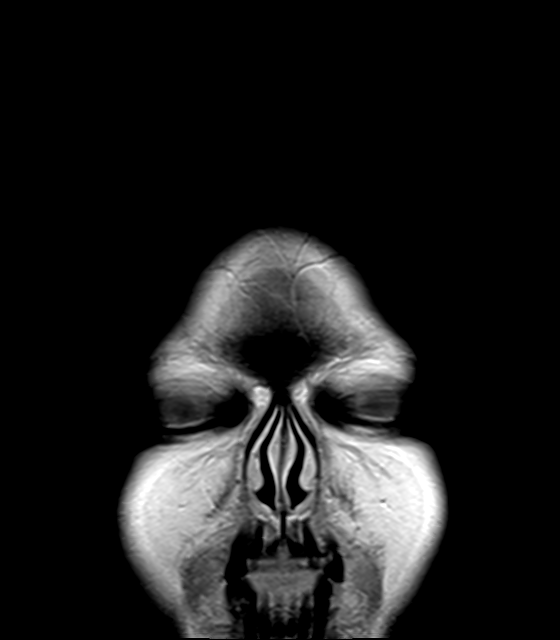
[im 6/28]
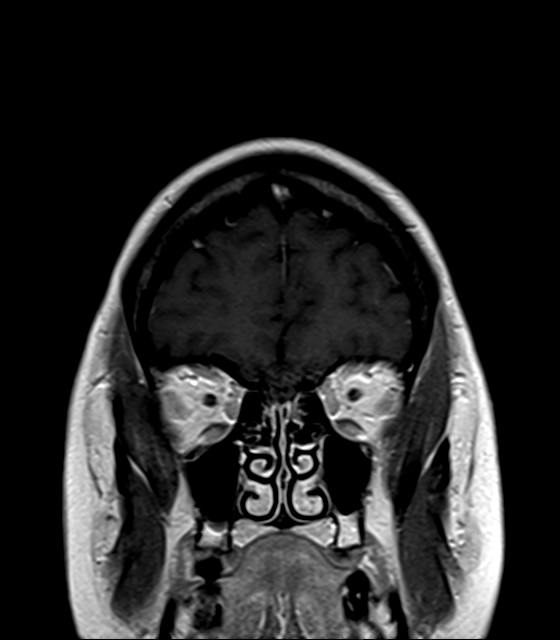
[im 11/28]
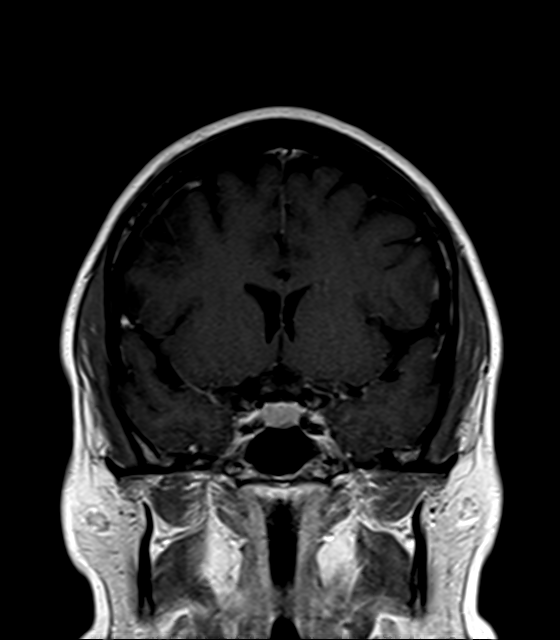
[im 17/28]
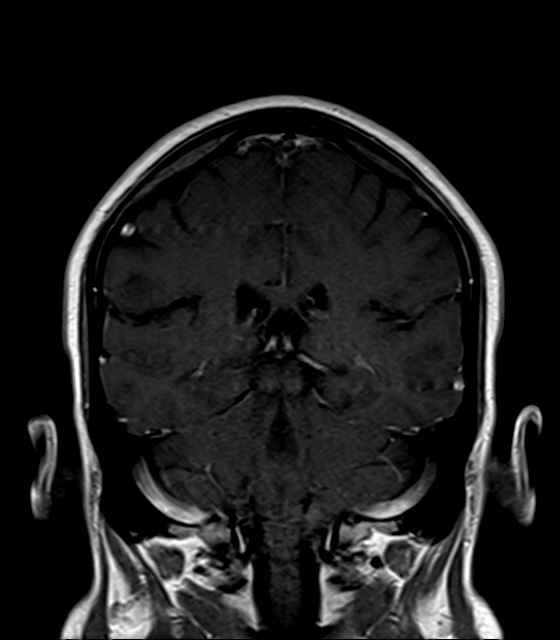
[im 22/28]
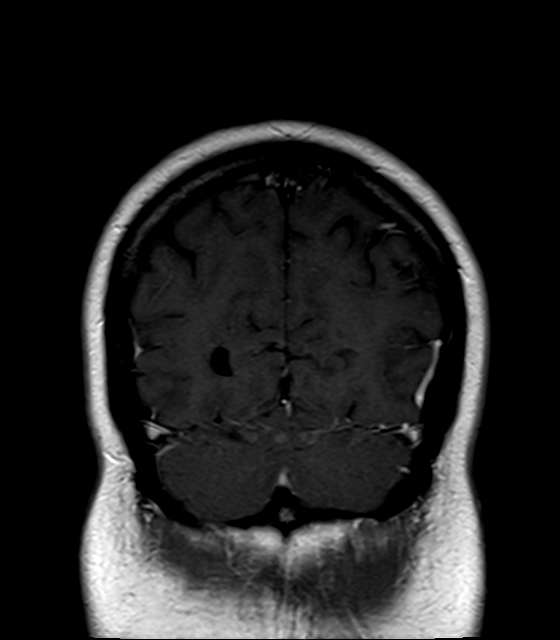
[im 28/28]
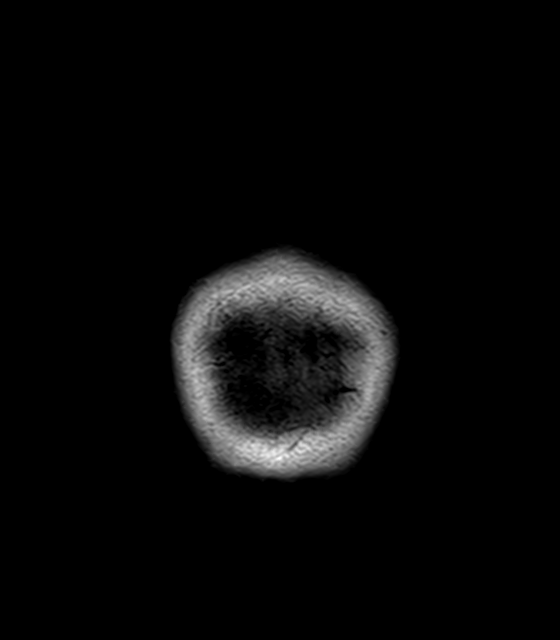

[Series 7: T1 post-contrast · sagittal · 5.0mm · 0.72mm/px · 4 of 19 slices shown (2 of 2)]
[im 1/19]
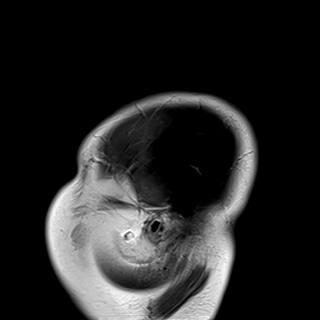
[im 7/19]
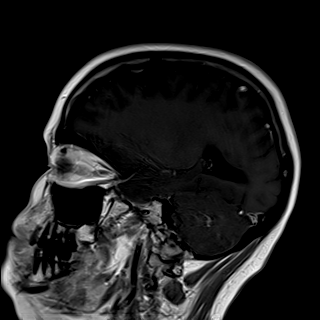
[im 13/19]
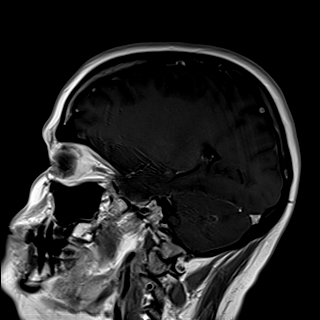
[im 19/19]
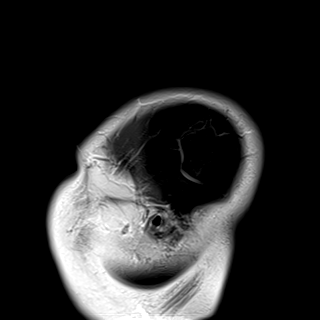

[10 of 48 positions shown; findings below may reference images not displayed]

FINDINGS: Only T1 postcontrast imaging was obtained given MRI brain was
performed 1 day earlier.

Please see prior MRI for characterization of acute right midbrain
infarct and T2/FLAIR hyperintense white matter lesions. There is no
abnormal enhancement on today's study to suggest active
demyelination. Punctate left frontal periventricular enhancement is
favored vascular when correlating with reformatted imaging. No
hydrocephalus.

Normal marrow signal.

Clear paranasal sinuses.  No orbital mass.

No evidence of dural sinus thrombosis. Grossly patent proximal
arteries at the skull base.
IMPRESSION: Only T1 postcontrast imaging was obtained given MRI brain was
performed yesterday.

1. No evidence of active demyelination. Specifically, no enhancement
of the T2/FLAIR lesions characterized on yesterday's MRI.
2. Please see prior MRI for characterization of acute right midbrain
infarct.

## 2020-01-02 MED ORDER — ATORVASTATIN CALCIUM 40 MG PO TABS
40.0000 mg | ORAL_TABLET | Freq: Every day | ORAL | 1 refills | Status: DC
Start: 2020-01-03 — End: 2020-03-11

## 2020-01-02 MED ORDER — LANTUS SOLOSTAR 100 UNIT/ML ~~LOC~~ SOPN
62.0000 [IU] | PEN_INJECTOR | Freq: Every morning | SUBCUTANEOUS | Status: DC
Start: 2020-01-02 — End: 2020-06-19

## 2020-01-02 MED ORDER — CLOPIDOGREL BISULFATE 75 MG PO TABS
75.0000 mg | ORAL_TABLET | Freq: Every day | ORAL | Status: DC
  Administered 2020-01-02: 75 mg via ORAL
  Filled 2020-01-02: qty 1

## 2020-01-02 MED ORDER — METOPROLOL SUCCINATE ER 25 MG PO TB24
12.5000 mg | ORAL_TABLET | Freq: Every day | ORAL | Status: DC
  Administered 2020-01-02: 12.5 mg via ORAL
  Filled 2020-01-02: qty 1

## 2020-01-02 MED ORDER — ASPIRIN 81 MG PO TBEC
81.0000 mg | DELAYED_RELEASE_TABLET | Freq: Every day | ORAL | 0 refills | Status: DC
Start: 2020-01-03 — End: 2020-09-16

## 2020-01-02 MED ORDER — METOPROLOL SUCCINATE ER 25 MG PO TB24: 25.0000 mg | ORAL_TABLET | Freq: Every day | ORAL | Status: DC

## 2020-01-02 MED ORDER — GADOBUTROL 1 MMOL/ML IV SOLN
10.0000 mL | Freq: Once | INTRAVENOUS | Status: AC | PRN
  Administered 2020-01-02: 10 mL via INTRAVENOUS

## 2020-01-02 MED ORDER — METOPROLOL SUCCINATE ER 25 MG PO TB24
12.5000 mg | ORAL_TABLET | Freq: Every day | ORAL | 1 refills | Status: DC
Start: 2020-01-03 — End: 2020-01-29

## 2020-01-02 MED ORDER — CLOPIDOGREL BISULFATE 75 MG PO TABS
75.0000 mg | ORAL_TABLET | Freq: Every day | ORAL | 3 refills | Status: DC
Start: 2020-01-03 — End: 2020-05-13

## 2020-01-02 MED ORDER — FENOFIBRATE 160 MG PO TABS
160.0000 mg | ORAL_TABLET | Freq: Every day | ORAL | 1 refills | Status: DC
Start: 2020-01-03 — End: 2020-03-11

## 2020-01-02 MED ORDER — CYANOCOBALAMIN 1000 MCG/ML IJ SOLN: INTRAMUSCULAR | 0 refills | Status: DC

## 2020-01-02 NOTE — Plan of Care (Signed)
  Problem: Education: Goal: Knowledge of secondary prevention will improve Outcome: Adequate for Discharge   Problem: Self-Care: Goal: Ability to participate in self-care as condition permits will improve Outcome: Adequate for Discharge   Problem: Education: Goal: Knowledge of General Education information will improve Description: Including pain rating scale, medication(s)/side effects and non-pharmacologic comfort measures Outcome: Adequate for Discharge   Problem: Health Behavior/Discharge Planning: Goal: Ability to manage health-related needs will improve Outcome: Adequate for Discharge   Problem: Clinical Measurements: Goal: Ability to maintain clinical measurements within normal limits will improve Outcome: Adequate for Discharge Goal: Will remain free from infection Outcome: Adequate for Discharge Goal: Diagnostic test results will improve Outcome: Adequate for Discharge Goal: Respiratory complications will improve Outcome: Adequate for Discharge Goal: Cardiovascular complication will be avoided Outcome: Adequate for Discharge   Problem: Activity: Goal: Risk for activity intolerance will decrease Outcome: Adequate for Discharge   Problem: Nutrition: Goal: Adequate nutrition will be maintained Outcome: Adequate for Discharge   Problem: Coping: Goal: Level of anxiety will decrease Outcome: Adequate for Discharge   Problem: Elimination: Goal: Will not experience complications related to bowel motility Outcome: Adequate for Discharge Goal: Will not experience complications related to urinary retention Outcome: Adequate for Discharge   Problem: Pain Managment: Goal: General experience of comfort will improve Outcome: Adequate for Discharge   Problem: Safety: Goal: Ability to remain free from injury will improve Outcome: Adequate for Discharge   Problem: Skin Integrity: Goal: Risk for impaired skin integrity will decrease Outcome: Adequate for Discharge

## 2020-01-02 NOTE — Progress Notes (Addendum)
Progress Note  Patient Name: Lauren [name] Date of Encounter: 01/02/2020  [facility] Cardiologist: New, Dr Harl Bowie  Subjective   No complaints  Inpatient Medications    Scheduled Meds: . aspirin EC  81 mg Oral Daily  . atorvastatin  40 mg Oral Daily  . enoxaparin (LOVENOX) injection  40 mg Subcutaneous Q24H  . fenofibrate  160 mg Oral Daily  . insulin aspart  0-15 Units Subcutaneous TID WC  . insulin aspart  0-5 Units Subcutaneous QHS  . insulin glargine  20 Units Subcutaneous Daily  . levothyroxine  175 mcg Oral QAC breakfast   Continuous Infusions: . sodium chloride     PRN Meds: acetaminophen **OR** acetaminophen (TYLENOL) oral liquid 160 mg/5 mL **OR** acetaminophen, pantoprazole, senna-docusate   Vital Signs    Vitals:   01/01/20 2125 01/01/20 2328 01/02/20 0109 01/02/20 0350  BP: (!) 159/92 (!) 148/84 (!) 139/94 (!) 119/58  Pulse: (!) 102 (!) 102 98 93  Resp: 20 18 20 20   Temp: 98.3 F (36.8 C) 98.1 F (36.7 C) 97.6 F (36.4 C) 98.4 F (36.9 C)  TempSrc: Oral Oral Oral Oral  SpO2:  100% 99% 97%  Weight: 106.5 kg     Height: 5\' 5"  (1.651 m)       Intake/Output Summary (Last 24 hours) at 01/02/2020 0800 Last data filed at 01/02/2020 0800 Gross per 24 hour  Intake 1000 ml  Output 1800 ml  Net -800 ml   Last 3 Weights 01/01/2020 04/03/2019 09/13/2016  Weight (lbs) 234 lb 14.4 oz 230 lb 240 lb  Weight (kg) 106.55 kg 104.327 kg 108.863 kg      Telemetry    SR- Personally Reviewed  ECG    n/a - Personally Reviewed  Physical Exam   GEN: No acute distress.   Neck: No JVD Cardiac: RRR, no murmurs, rubs, or gallops.  Respiratory: Clear to auscultation bilaterally. GI: Soft, nontender, non-distended  MS: No edema; No deformity. Neuro:  Nonfocal  Psych: Normal affect   Labs    High Sensitivity Troponin:   Recent Labs  Lab 12/31/19 1620 12/31/19 2221  TROPONINIHS 413* 367*      Chemistry Recent Labs  Lab 12/31/19 1620  01/01/20 0319 01/02/20 0543  NA 139 141 139  K 3.9 4.1 4.2  CL 107 110 107  CO2 23 21* 22  GLUCOSE 168* 85 193*  BUN 27* 24* 21*  CREATININE 1.80* 1.62* 1.64*  CALCIUM 8.5* 8.2* 8.2*  GFRNONAA 35* 40* 40*  GFRAA 41* 47* 46*  ANIONGAP 9 10 10      Hematology Recent Labs  Lab 12/31/19 1620 01/01/20 0319  WBC 14.9* 10.8*  RBC 4.40 4.00  HGB 12.7 11.6*  HCT 39.6 36.6  MCV 90.0 91.5  MCH 28.9 29.0  MCHC 32.1 31.7  RDW 13.2 13.3  PLT 439* 374    BNPNo results for input(s): BNP, PROBNP in the last 168 hours.   DDimer No results for input(s): DDIMER in the last 168 hours.   Radiology    MR ANGIO HEAD WO CONTRAST  Result Date: 12/31/2019 CLINICAL DATA:  Neuro deficit EXAM: MRI HEAD WITHOUT CONTRAST MRA HEAD WITHOUT CONTRAST TECHNIQUE: Multiplanar, multiecho pulse sequences of the brain and surrounding structures were obtained without intravenous contrast. Angiographic images of the head were obtained using MRA technique without contrast. COMPARISON:  None. FINDINGS: Some image sequences are degraded by motion artifact. MRI HEAD FINDINGS Brain: Focal restricted diffusion involving the right mid brain measuring 8 mm (  5:13) in AP dimension. Scattered and confluent periventricular and subcortical white matter T2/FLAIR hyperintense foci. No midline shift, ventriculomegaly or extra-axial fluid collection. No mass lesion. Vascular: Please see MRA head. Skull and upper cervical spine: Normal marrow signal. Sinuses/Orbits: Normal orbits. Clear paranasal sinuses. No mastoid effusion. Other: None. MRA HEAD FINDINGS Please note that time-of-flight imaging is degraded by motion artifact, limiting evaluation. Anterior circulation: Moderate left and mild right supraclinoid ICA narrowing. Irregularity of the left greater than right intracavernous ICAs may reflect atheromatous disease versus motion artifact. No significant proximal occlusion, aneurysm, or vascular malformation. The anterior and middle  cerebral arteries are patent. Posterior circulation: Dominant left vertebral artery. No significant stenosis, proximal occlusion, aneurysm, or vascular malformation. Patent bilateral V4 segments and basilar artery. Patent bilateral PCAs. The proximal bilateral superior cerebellar arteries are patent, not well visualized distally secondary to motion artifact. Venous sinuses: No evidence of thrombosis. Anatomic variants: Left PCOM hypoplasia. Fetal origin of the right PCA. 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 Supratentorial white matter FLAIR hyperintensities. Differential includes demyelination versus chronic microvascular ischemic changes. Motion artifact limits evaluation on time-of-flight. Moderate left and mild right supraclinoid ICA narrowing. No large vessel occlusion. These results were called by telephone at the time of interpretation on 12/31/2019 at 5:24 pm to provider [facility] ZAMMIT , who verbally acknowledged these results. Electronically Signed   By: Primitivo Gauze M.D.   On: 12/31/2019 17:46   MR BRAIN WO CONTRAST  Result Date: 12/31/2019 CLINICAL DATA:  Neuro deficit EXAM: MRI HEAD WITHOUT CONTRAST MRA HEAD WITHOUT CONTRAST TECHNIQUE: Multiplanar, multiecho pulse sequences of the brain and surrounding structures were obtained without intravenous contrast. Angiographic images of the head were obtained using MRA technique without contrast. COMPARISON:  None. FINDINGS: Some image sequences are degraded by motion artifact. MRI HEAD FINDINGS Brain: Focal restricted diffusion involving the right mid brain measuring 8 mm (5:13) in AP dimension. Scattered and confluent periventricular and subcortical white matter T2/FLAIR hyperintense foci. No midline shift, ventriculomegaly or extra-axial fluid collection. No mass lesion. Vascular: Please see MRA head. Skull and upper cervical spine: Normal marrow signal. Sinuses/Orbits: Normal orbits. Clear paranasal sinuses. No mastoid effusion.  Other: None. MRA HEAD FINDINGS Please note that time-of-flight imaging is degraded by motion artifact, limiting evaluation. Anterior circulation: Moderate left and mild right supraclinoid ICA narrowing. Irregularity of the left greater than right intracavernous ICAs may reflect atheromatous disease versus motion artifact. No significant proximal occlusion, aneurysm, or vascular malformation. The anterior and middle cerebral arteries are patent. Posterior circulation: Dominant left vertebral artery. No significant stenosis, proximal occlusion, aneurysm, or vascular malformation. Patent bilateral V4 segments and basilar artery. Patent bilateral PCAs. The proximal bilateral superior cerebellar arteries are patent, not well visualized distally secondary to motion artifact. Venous sinuses: No evidence of thrombosis. Anatomic variants: Left PCOM hypoplasia. Fetal origin of the right PCA. 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 Supratentorial white matter FLAIR hyperintensities. Differential includes demyelination versus chronic microvascular ischemic changes. Motion artifact limits evaluation on time-of-flight. Moderate left and mild right supraclinoid ICA narrowing. No large vessel occlusion. These results were called by telephone at the time of interpretation on 12/31/2019 at 5:24 pm to provider [facility] ZAMMIT , who verbally acknowledged these results. Electronically Signed   By: Primitivo Gauze M.D.   On: 12/31/2019 17:46   US RENAL  Result Date: 01/01/2020 CLINICAL DATA:  Acute on chronic renal failure, diabetes mellitus EXAM: RENAL / URINARY TRACT ULTRASOUND COMPLETE COMPARISON:  01/23/2015 FINDINGS: Right Kidney:  Renal measurements: 10.6 x 5.8 x 6.9 cm = volume: 221 mL. Normal cortical thickness and upper normal cortical echogenicity. No mass, hydronephrosis, or shadowing calcification. Left Kidney: Renal measurements: 11.2 x 6.5 x 5.4 cm = volume: 2 of 5 mL. Normal cortical thickness. Upper  normal cortical echogenicity. No mass, hydronephrosis, or shadowing calcification. Bladder: Appears normal for degree of bladder distention. Other: N/A IMPRESSION: No renal sonographic abnormalities. Electronically Signed   By: Lavonia Dana M.D.   On: 01/01/2020 11:25   US Carotid Bilateral (at [facility] and [facility] only)  Result Date: 01/01/2020 CLINICAL DATA:  Acute CVA. EXAM: BILATERAL CAROTID DUPLEX ULTRASOUND TECHNIQUE: Pearline Cables scale imaging, color Doppler and duplex ultrasound were performed of bilateral carotid and vertebral arteries in the neck. COMPARISON:  None. FINDINGS: Criteria: Quantification of carotid stenosis is based on velocity parameters that correlate the residual internal carotid diameter with NASCET-based stenosis levels, using the diameter of the distal internal carotid lumen as the denominator for stenosis measurement. The following velocity measurements were obtained: RIGHT ICA: 83/35 cm/sec CCA: 277/41 cm/sec SYSTOLIC ICA/CCA RATIO:  0.8 ECA: 90 cm/sec LEFT ICA: 91/39 cm/sec CCA: 28/78 cm/sec SYSTOLIC ICA/CCA RATIO:  0.9 ECA: 79 cm/sec RIGHT CAROTID ARTERY: Minimal plaque or intimal thickening at the right carotid bulb. External carotid artery is patent with normal waveform. Normal waveforms and velocities in the internal carotid artery. RIGHT VERTEBRAL ARTERY: Antegrade flow and normal waveform in the right vertebral artery. LEFT CAROTID ARTERY: Minimal plaque at the left carotid bulb. External carotid artery is patent with normal waveform. Minimal plaque in the proximal internal carotid artery. Normal waveforms and velocities in the internal carotid artery. LEFT VERTEBRAL ARTERY: Antegrade flow and normal waveform in the left vertebral artery. IMPRESSION: 1. Minimal plaque in the carotid arteries, left side greater than right. Estimated degree of stenosis in the internal carotid arteries is less than 50% bilaterally. 2. Patent vertebral arteries with antegrade flow. Electronically Signed   By:  Markus Daft M.D.   On: 01/01/2020 15:57   DG Chest Port 1 View  Result Date: 12/31/2019 CLINICAL DATA:  Chest pain EXAM: PORTABLE CHEST 1 VIEW COMPARISON:  Multiple priors, most recent 09/13/2016 FINDINGS: The heart size and mediastinal contours are within normal limits. Both lungs are clear. No pleural effusion. No pneumothorax. The visualized skeletal structures are unremarkable. IMPRESSION: No active disease. Electronically Signed   By: Margaretha Sheffield MD   On: 12/31/2019 13:17   ECHOCARDIOGRAM COMPLETE  Result Date: 01/01/2020    ECHOCARDIOGRAM REPORT   Patient Name:   ZELPHIA GLOVER Date of Exam: 01/01/2020 Medical Rec #:  [id number]             Height:       65.0 in Accession #:    [id number]            Weight:       230.0 lb Date of Birth:  Aug 23, 1980             BSA:          2.099 m Patient Age:    39 years              BP:           128/84 mmHg Patient Gender: F                     HR:           89 bpm. Exam Location:  [facility] Procedure: 2D  Echo and Intracardiac Opacification Agent Indications:    Stroke 434.91 / I163.9  History:        Patient has no prior history of Echocardiogram examinations.                 Stroke, Signs/Symptoms:Chest Pain; Risk Factors:Diabetes, Former                 Smoker and Hypertension. Acute renal failure superimposed on                 stage 3a chronic kidney disease.  Sonographer:    Leavy Cella RDCS (AE) Referring Phys: [id number]  1. Left ventricular ejection fraction, by estimation, is 55 to 60%. The left ventricle has normal function. The left ventricle demonstrates regional wall motion abnormalities (see scoring diagram/findings for description). There is mild left ventricular  hypertrophy. Left ventricular diastolic parameters were normal. There is moderate hypokinesis of the left ventricular, apical. There is sluggish apical flow without clear formed thrombus.  2. Right ventricular systolic function is normal. The right  ventricular size is normal. There is mildly elevated pulmonary artery systolic pressure.  3. The mitral valve is normal in structure. Trivial mitral valve regurgitation. No evidence of mitral stenosis.  4. The inferior vena cava is normal in size with greater than 50% respiratory variability, suggesting right atrial pressure of 3 mmHg.  5. The aortic valve is tricuspid. Aortic valve regurgitation is not visualized. No aortic stenosis is present. FINDINGS  Left Ventricle: Left ventricular ejection fraction, by estimation, is 55 to 60%. The left ventricle has normal function. The left ventricle demonstrates regional wall motion abnormalities. Definity contrast agent was given IV to delineate the left ventricular endocardial borders. The left ventricular internal cavity size was normal in size. There is mild left ventricular hypertrophy. Left ventricular diastolic parameters were normal. Right Ventricle: The right ventricular size is normal. No increase in right ventricular wall thickness. Right ventricular systolic function is normal. There is mildly elevated pulmonary artery systolic pressure. The tricuspid regurgitant velocity is 2.57  m/s, and with an assumed right atrial pressure of 10 mmHg, the estimated right ventricular systolic pressure is 23.3 mmHg. Left Atrium: Left atrial size was normal in size. Right Atrium: Right atrial size was normal in size. Pericardium: A small pericardial effusion is present. The pericardial effusion is circumferential. Mitral Valve: The mitral valve is normal in structure. Trivial mitral valve regurgitation. No evidence of mitral valve stenosis. Tricuspid Valve: The tricuspid valve is normal in structure. Tricuspid valve regurgitation is not demonstrated. No evidence of tricuspid stenosis. Aortic Valve: The aortic valve is tricuspid. Aortic valve regurgitation is not visualized. No aortic stenosis is present. Aortic valve mean gradient measures 4.8 mmHg. Aortic valve peak gradient  measures 8.7 mmHg. Aortic valve area, by VTI measures 2.04 cm. Pulmonic Valve: The pulmonic valve was not well visualized. Pulmonic valve regurgitation is not visualized. No evidence of pulmonic stenosis. Aorta: The aortic root is normal in size and structure. Venous: The inferior vena cava is normal in size with greater than 50% respiratory variability, suggesting right atrial pressure of 3 mmHg. IAS/Shunts: No atrial level shunt detected by color flow Doppler.  LEFT VENTRICLE PLAX 2D LVIDd:         3.77 cm  Diastology LVIDs:         2.62 cm  LV e' lateral:   9.46 cm/s LV PW:         1.63 cm  LV E/e' lateral:  9.1 LV IVS:        1.15 cm LVOT diam:     1.90 cm LV SV:         54 LV SV Index:   26 LVOT Area:     2.84 cm  RIGHT VENTRICLE RV S prime:     13.20 cm/s TAPSE (M-mode): 2.1 cm LEFT ATRIUM             Index       RIGHT ATRIUM           Index LA diam:        3.70 cm 1.76 cm/m  RA Area:     11.10 cm LA Vol (A2C):   35.2 ml 16.77 ml/m RA Volume:   22.80 ml  10.86 ml/m LA Vol (A4C):   35.9 ml 17.10 ml/m LA Biplane Vol: 36.9 ml 17.58 ml/m  AORTIC VALVE AV Area (Vmax):    2.10 cm AV Area (Vmean):   1.82 cm AV Area (VTI):     2.04 cm AV Vmax:           147.68 cm/s AV Vmean:          102.346 cm/s AV VTI:            0.267 m AV Peak Grad:      8.7 mmHg AV Mean Grad:      4.8 mmHg LVOT Vmax:         109.26 cm/s LVOT Vmean:        65.596 cm/s LVOT VTI:          0.192 m LVOT/AV VTI ratio: 0.72  AORTA Ao Root diam: 2.50 cm MITRAL VALVE               TRICUSPID VALVE MV Area (PHT): 6.27 cm    TR Peak grad:   26.4 mmHg MV Decel Time: 121 msec    TR Vmax:        257.00 cm/s MV E velocity: 86.10 cm/s MV A velocity: 27.00 cm/s  SHUNTS MV E/A ratio:  3.19        Systemic VTI:  0.19 m                            Systemic Diam: 1.90 cm Carlyle Dolly MD Electronically signed by Carlyle Dolly MD Signature Date/Time: 01/01/2020/12:41:24 PM    Final     Cardiac Studies     Patient Profile     Francine Hannan  is a 39 y.o. female with past medical history of MI (cath 5+ years ago in [facility] which showed no blockages per her report), IDDM, GERD, Stage 3 CKD and Hypothyroidism who is being seen today for the evaluation of chest pain at the request of Dr. Carles Collet.   Assessment & Plan    1. Elevated troponin - in the setting of acute CVA - mild trop peak 413 trending down. EKG no acute ischemic changes -  Isolated episode of chest pain without recurrence - echo does some some apical hypokinesis. The fact her chest pain, apical hypokinesis, mild trop elevation happened simulatneously with her acute CVA would suggest more a stress induced CM as opposed to acute obstructive coronary disease - in general limit invasive cardiac testing in setting of CVA due to increased bleeding risk  - would treat medically, repeat echo in 3 weeks to see if WMA resolves. If persistent would consider noninvasive ischemic testing if no  recurrent chest pains, invasive testing if having recurring symptoms.   - continue ASA, statin, start toprol 12.5mg  daily. Hold on ACE/ARB given renal dysfunction, though if stable likely could tolerate low dose.    We will sign off inpatient care, will arrange f/u    For questions or updates, please contact [place] Please consult [email or web address] for contact info under        Signed, Carlyle Dolly, MD  01/02/2020, 8:00 AM

## 2020-01-02 NOTE — Progress Notes (Signed)
OT Cancellation Note  Patient Details Name: Lauren [name] MRN: [id number] DOB: Jul 20, 1980   Cancelled Treatment:    Reason Eval/Treat Not Completed: OT screened, no needs identified, will sign off. Pt reports LUE numbness has resolved, only has continued facial numbness. Pt demonstrates BUE strength WNL, coordination and sensation are intact. Pt completing ADLs independently, no further OT services required at this time.   Guadelupe Sabin, OTR/L  [phone] 01/02/2020, 8:01 AM

## 2020-01-02 NOTE — Progress Notes (Addendum)
Physical Therapy Treatment Patient Details Name: Lauren [name] MRN: [id number] DOB: [birth year] Today's Date: 01/02/2020    History of Present Illness The patient is a 39 y.o. year-old w/ hx of MI, DM2, CKD 3 presents w/ numbness of the L face and arm yesterday. Today had some numbness to the L leg and some chest discomfort. Came to ED where MRI showed an acute CVA of the midbrain. MRA was negative for large vessel occlusion. Also trop is 410.  Asked to see for admission.  Given asa and ntg for ^troponin.     PT Comments    The patient was independent in all bed mobility, transfers, and ambulation today. She displayed decreased weight acceptance in RLE, but gait pattern was [facility] during 120 feet ambulation with no AD. She was able to complete therapeutic exercise without fatigue or LOB. She received education on the importance of these exercises 2X/day in order to decrease risk for further health complications. Patient tolerated sitting up in chair after therapy. Patient discharged from physical therapy to care of nursing for ambulation daily as tolerated for length of stay at the venue listed below.     Follow Up Recommendations  Outpatient PT     Equipment Recommendations  None recommended by PT    Recommendations for Other Services       Precautions / Restrictions Precautions Precautions: Fall Restrictions Weight Bearing Restrictions: No    Mobility  Bed Mobility Overal bed mobility: Independent                Transfers Overall transfer level: Modified independent (increased time taken) Equipment used: None                Ambulation/Gait Ambulation/Gait assistance: Supervision Gait Distance (Feet): 120 Feet Assistive device: None Gait Pattern/deviations: Step-through pattern;Decreased step length - right;Decreased stance time - left;Decreased weight shift to right;WFL(Within Functional Limits)     General Gait Details: mild gait abnormalities noted,  decreased weight acceptance on RLE   Stairs             Wheelchair Mobility    Modified Rankin (Stroke Patients Only)       Balance Overall balance assessment: Independent Sitting-balance support: Feet supported;No upper extremity supported Sitting balance-Leahy Scale: Normal     Standing balance support: No upper extremity supported;During functional activity Standing balance-Leahy Scale: Good Standing balance comment: mild decreased L hip stability                            Cognition Arousal/Alertness: Awake/alert Behavior During Therapy: WFL for tasks assessed/performed Overall Cognitive Status: Within Functional Limits for tasks assessed                                        Exercises General Exercises - Lower Extremity Long Arc Quad: AROM;Seated;Strengthening;Both;10 reps Hip Flexion/Marching: AROM;Strengthening;Seated;Both;10 reps Toe Raises: AROM;Strengthening;Seated;Both;10 reps Heel Raises: AROM;Strengthening;Seated;Both;10 reps    General Comments        Pertinent Vitals/Pain Pain Assessment: No/denies pain    Home Living                      Prior Function            PT Goals (current goals can now be found in the care plan section) Acute Rehab PT Goals Patient Stated Goal: go home  PT Goal Formulation: With patient/family Time For Goal Achievement: 01/04/20 Potential to Achieve Goals: Good Progress towards PT goals: Progressing toward goals    Frequency    Min 3X/week      PT Plan Current plan remains appropriate    Co-evaluation              AM-PAC PT "6 Clicks" Mobility   Outcome Measure  Help needed turning from your back to your side while in a flat bed without using bedrails?: None Help needed moving from lying on your back to sitting on the side of a flat bed without using bedrails?: None Help needed moving to and from a bed to a chair (including a wheelchair)?: None Help  needed standing up from a chair using your arms (e.g., wheelchair or bedside chair)?: None Help needed to walk in hospital room?: None Help needed climbing 3-5 steps with a railing? : A Little 6 Click Score: 23    End of Session   Activity Tolerance: Patient tolerated treatment well Patient left: with call bell/phone within reach;in chair Nurse Communication: Mobility status PT Visit Diagnosis: Unsteadiness on feet (R26.81);Other abnormalities of gait and mobility (R26.89);Muscle weakness (generalized) (M62.81);Ataxic gait (R26.0);Hemiplegia and hemiparesis Hemiplegia - Right/Left: Left Hemiplegia - dominant/non-dominant: Non-dominant Hemiplegia - caused by: Cerebral infarction     Time: 5732-2025 PT Time Calculation (min) (ACUTE ONLY): 15 min  Charges:  $Therapeutic Activity: 8-22 mins                     2:03 PM , 01/02/20 Karlyn Agee, SPT Physical Therapy with [facility] [phone] office  During this treatment session, the therapist was present, participating in and directing the treatment.  2:03 PM, 01/02/20 Lonell Grandchild, MPT Physical Therapist with [facility] 336 [phone] office [phone] mobile phone

## 2020-01-02 NOTE — Discharge Summary (Signed)
Physician Discharge Summary  Penda Venturi WEX:[id number] DOB: [birth year] DOA: 12/31/2019  PCP: Lemmie Evens, MD  Admit date: 12/31/2019 Discharge date: 01/02/2020  Time spent: 35 minutes  Recommendations for Outpatient Follow-up:  1. Reassess blood pressure and adjust antihypertensive regimen medications as needed 2. Repeat basic metabolic panel to follow closely renal function 3. Continue assisting patient with tobacco cessation 4. Close monitoring of patient CBGs/A1c with further adjustment to hypoglycemic therapy as required. 5. Repeat B12 level in 4 months.   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  Discharged home with instruction to follow-up with PCP in 10 days, neurology service in 6 weeks and follow-up with cardiology service as an outpatient.  Code Status: Full code  Diet recommendation: Modified carbohydrate, heart healthy and low calorie diet.  Filed Weights   01/01/20 2125  Weight: 106.5 kg    History of present illness:  As per H&P Written by Dr. Jonnie Finner on 12/31/2019 Patient is a 39 y.o. year-old w/ hx of MI, DM2, CKD 3 presents w/ numbness of the L face and arm yesterday. Today had some numbness to the L leg and some chest discomfort. Came to ED where MRI showed an acute CVA of the midbrain. MRA was negative for large vessel occlusion. Also trop is 410.  Asked to see for admission.  Given asa and ntg for ^troponin.   Pt states her whole face is numb, parts  of the left arm and leg are numb but the face is worse.  No swallowing issue, just ate a piece of chicken. No loss of motor strength.  Earlier today also had episode of chest tightness after "walking down to the shop", describing as a heaviness on her chest, like a "brick setting on my chest", she vomited and had to sit down.  Chest tightness has resolved now.  No hx CP recently.  EKG here shows no acute, trop is 410.   Pt denies hx of CVA.  Pt lives at home w/ her husband.    Hospital Course:  Acute ischemic stroke -Appreciate neurology consultation -PT/OT evaluation has recommended outpatient service, patient in agreement -2D echo without emboli, preserved ejection fraction and with moderate hypokinesis of the left ventricle appreciated. -A1c 10.3, B12 152 and mixed hyperlipidemia -Continue aggressive treatment of diabetes, antihypertensive agents and initiation of statin/fenofibrate. -From secondary prevention following neurology recommendations patient will use aspirin and Plavix for 30 days with subsequently use only of Plavix.  Acute on chronic renal failure: Patient with chronic kidney disease a stage IIIa at baseline -Baseline creatinine 1.3-1.6 -Serum creatinine peaked at 1.8 on admission -After continue the use of fluid resuscitation and minimizing nephrotoxic agents patient's creatinine back to baseline -Advised to maintain adequate hydration and to avoid nephrotoxic agents as an outpatient.  Chest pain/elevated troponin/history of coronary artery disease in the past -Appreciate cardiology service consultation -Echo with some wall motion abnormalities appreciated -Troponin trended down and patient expressed no further chest pain -Continue aspirin and Plavix and  also statins -Outpatient follow-up for stress test and further ischemic work-up as needed.  Type 1 diabetes mellitus poorly controlled, with nephropathy -Patient advised to be compliant with medication -Continue the use  of insulin and close follow-up with PCP -A1c was 10.3; but she expressed that this is down from over 13 reason outpatient follow-up. -continue modified carb diet.  Mixed hyperlipidemia -Patient has been started on statin and fenofibrate -Heart healthy diet has been encouraged.  Hypothyroidism -TSH within normal limits -continue Synthroid  B12 deficiency -started on B12 repletion -repeat B12 level in 4 months  Class 2 obesity -Body mass index is 39.09 kg/m. -Low calorie diet, portion control and increase physical activity discussed with patient.  History of tobacco abuse -Tobacco cessation counseling has been provided.      Procedures:  See below for x-ray reports   US carotid bilateral: 1. Minimal plaque in the carotid arteries, left side greater than right. Estimated degree of stenosis in the internal carotid arteries is less than 50% bilaterally. 2. Patent vertebral arteries with antegrade flow.   2D echo: 1. Left ventricular ejection fraction, by estimation, is 55 to 60%. The  left ventricle has normal function. The left ventricle demonstrates  regional wall motion abnormalities (see scoring diagram/findings for  description). There is mild left ventricular  hypertrophy. Left ventricular diastolic parameters were normal. There is  moderate hypokinesis of the left ventricular, apical. There is sluggish  apical flow without clear formed thrombus.  2. Right ventricular systolic function is normal. The right ventricular  size is normal. There is mildly elevated pulmonary artery systolic  pressure.  3. The mitral valve is normal in structure. Trivial mitral valve  regurgitation. No evidence of mitral stenosis.  4. The inferior vena cava is normal in size with greater than 50%  respiratory variability, suggesting right atrial pressure of 3 mmHg.  5. The aortic valve is tricuspid. Aortic valve regurgitation is not  visualized. No aortic stenosis is present.     Consultations: Neurology  Cardiology  Discharge Exam: Vitals:   01/02/20 0350 01/02/20 1145  BP: (!) 119/58 (!) 159/102  Pulse: 93 100  Resp: 20 18  Temp: 98.4 F (36.9 C) 97.9 F (36.6 C)  SpO2: 97% 98%    General: Afebrile, no chest pain, no nausea, no vomiting, reports almost complete resolution of neurologic symptoms, no pronator drift and no dysarthria. Still with some numbness, but otherwise no residual deficits or complaints.  Cardiovascular: S1-S2, no rubs, no gallops, no murmurs, no JVD. Respiratory: Clear to auscultation bilaterally, no use of accessory muscles appreciated.  Good oxygen saturation on room air. Abdomen: Soft, nontender, distended, obese, positive bowel sounds. Extremities: No cyanosis or clubbing.   Discharge Instructions   Discharge Instructions    Ambulatory referral to Physical Therapy   Complete by: As directed    Diet - low sodium heart healthy   Complete by: As directed    Diet Carb Modified   Complete by: As directed    Discharge instructions   Complete by: As directed    Take medications as prescribed Follow-up in 6 weeks with neurologist (Dr. Merlene Laughter) Arrange follow-up with PCP in 10 days Maintain adequate hydration Follow modified carbohydrate, heart healthy and low calorie diet. Remember to take aspirin (81 mg) along with Plavix on daily basis for 30 days; after that only Plavix daily for secondary prevention. Follow-up with cardiology service as instructed (office will contact you with appointment details).   Increase activity slowly   Complete by:  As directed      Allergies as of 01/02/2020      Reactions   Wellbutrin [bupropion] Hives   Ciprofloxacin Hcl Hives, Rash   Hives/rash at injection site    Tape Rash      Medication List    STOP taking these medications   aspirin 81 MG chewable tablet Replaced by: aspirin 81 MG EC tablet     TAKE these medications   aspirin 81 MG EC tablet Take 1 tablet (81 mg total)  by mouth daily. Swallow whole. Start taking on: January 03, 2020 Replaces: aspirin 81 MG chewable tablet   atorvastatin 40 MG tablet Commonly known as: LIPITOR Take 1 tablet (40 mg total) by mouth daily. Start taking on: January 03, 2020   clopidogrel 75 MG tablet Commonly known as: PLAVIX Take 1 tablet (75 mg total) by mouth daily. Start taking on: January 03, 2020   cyanocobalamin 1000 MCG/ML injection Commonly known as: (VITAMIN B-12) Inject 1 mL daily for 6 days; then weekly for a month and then monthly after that.   fenofibrate 160 MG tablet Take 1 tablet (160 mg total) by mouth daily. Start taking on: January 03, 2020   furosemide 40 MG tablet Commonly known as: LASIX Take 40 mg by mouth daily as needed for fluid.   glucagon 1 MG injection Inject 1 mg into the vein once as needed.   insulin lispro 100 UNIT/ML injection Commonly known as: HUMALOG Inject 5-10 Units into the skin 3 (three) times daily before meals.   Lantus SoloStar 100 UNIT/ML Solostar Pen Generic drug: insulin glargine Inject 62 Units into the skin in the morning. What changed: See the new instructions.   levothyroxine 175 MCG tablet Commonly known as: SYNTHROID Take 175 mcg by mouth daily before breakfast.   metoprolol succinate 25 MG 24 hr tablet Commonly known as: TOPROL-XL Take 0.5 tablets (12.5 mg total) by mouth daily. Start taking on: January 03, 2020   pantoprazole 40 MG tablet Commonly known as: PROTONIX Take 40 mg by mouth daily as needed (for acid reflux).   potassium chloride SA 20 MEQ tablet Commonly known as: KLOR-CON Take 20 mEq by mouth daily as needed (When taking Lasix (Furosemide)).      Allergies  Allergen Reactions  . Wellbutrin [Bupropion] Hives  . Ciprofloxacin Hcl Hives and Rash    Hives/rash at injection site   . Tape Rash    Follow-up Information    Lemmie Evens, MD. Schedule an appointment as soon as possible for a visit in 10 day(s).    Specialty: Family Medicine Contact information: [place] [address] [phone]        Phillips Odor, MD. Schedule an appointment as soon as possible for a visit in 6 week(s).   Specialty: Neurology Contact information: [address] [place] [address] [phone]               The results of significant diagnostics from this hospitalization (including imaging, microbiology, ancillary and laboratory) are listed below for reference.    Significant Diagnostic Studies: MR ANGIO HEAD WO CONTRAST  Result Date: 12/31/2019 CLINICAL DATA:  Neuro deficit EXAM: MRI HEAD WITHOUT CONTRAST MRA HEAD WITHOUT CONTRAST TECHNIQUE: Multiplanar, multiecho pulse sequences of the brain and surrounding structures were obtained without intravenous contrast. Angiographic images of the head were obtained using MRA technique without contrast. COMPARISON:  None. FINDINGS: Some image sequences are degraded by motion artifact. MRI HEAD FINDINGS Brain: Focal restricted diffusion involving  the right mid brain measuring 8 mm (5:13) in AP dimension. Scattered and confluent periventricular and subcortical white matter T2/FLAIR hyperintense foci. No midline shift, ventriculomegaly or extra-axial fluid collection. No mass lesion. Vascular: Please see MRA head. Skull and upper cervical spine: Normal marrow signal. Sinuses/Orbits: Normal orbits. Clear paranasal sinuses. No mastoid effusion. Other: None. MRA HEAD FINDINGS Please note that time-of-flight imaging is degraded by motion artifact, limiting evaluation. Anterior circulation: Moderate left and mild right supraclinoid ICA narrowing. Irregularity of the left greater than right intracavernous ICAs may reflect atheromatous disease versus motion artifact. No significant proximal occlusion, aneurysm, or vascular malformation. The anterior and middle cerebral arteries are patent. Posterior circulation: Dominant left vertebral artery. No  significant stenosis, proximal occlusion, aneurysm, or vascular malformation. Patent bilateral V4 segments and basilar artery. Patent bilateral PCAs. The proximal bilateral superior cerebellar arteries are patent, not well visualized distally secondary to motion artifact. Venous sinuses: No evidence of thrombosis. Anatomic variants: Left PCOM hypoplasia. Fetal origin of the right PCA. 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 Supratentorial white matter FLAIR hyperintensities. Differential includes demyelination versus chronic microvascular ischemic changes. Motion artifact limits evaluation on time-of-flight. Moderate left and mild right supraclinoid ICA narrowing. No large vessel occlusion. These results were called by telephone at the time of interpretation on 12/31/2019 at 5:24 pm to provider [facility] ZAMMIT , who verbally acknowledged these results. Electronically Signed   By: Primitivo Gauze M.D.   On: 12/31/2019 17:46   MR BRAIN WO CONTRAST  Result Date: 12/31/2019 CLINICAL DATA:  Neuro deficit EXAM: MRI HEAD WITHOUT CONTRAST MRA HEAD WITHOUT CONTRAST TECHNIQUE: Multiplanar, multiecho pulse sequences of the brain and surrounding structures were obtained without intravenous contrast. Angiographic images of the head were obtained using MRA technique without contrast. COMPARISON:  None. FINDINGS: Some image sequences are degraded by motion artifact. MRI HEAD FINDINGS Brain: Focal restricted diffusion involving the right mid brain measuring 8 mm (5:13) in AP dimension. Scattered and confluent periventricular and subcortical white matter T2/FLAIR hyperintense foci. No midline shift, ventriculomegaly or extra-axial fluid collection. No mass lesion. Vascular: Please see MRA head. Skull and upper cervical spine: Normal marrow signal. Sinuses/Orbits: Normal orbits. Clear paranasal sinuses. No mastoid effusion. Other: None. MRA HEAD FINDINGS Please note that time-of-flight imaging is degraded by  motion artifact, limiting evaluation. Anterior circulation: Moderate left and mild right supraclinoid ICA narrowing. Irregularity of the left greater than right intracavernous ICAs may reflect atheromatous disease versus motion artifact. No significant proximal occlusion, aneurysm, or vascular malformation. The anterior and middle cerebral arteries are patent. Posterior circulation: Dominant left vertebral artery. No significant stenosis, proximal occlusion, aneurysm, or vascular malformation. Patent bilateral V4 segments and basilar artery. Patent bilateral PCAs. The proximal bilateral superior cerebellar arteries are patent, not well visualized distally secondary to motion artifact. Venous sinuses: No evidence of thrombosis. Anatomic variants: Left PCOM hypoplasia. Fetal origin of the right PCA. 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 Supratentorial white matter FLAIR hyperintensities. Differential includes demyelination versus chronic microvascular ischemic changes. Motion artifact limits evaluation on time-of-flight. Moderate left and mild right supraclinoid ICA narrowing. No large vessel occlusion. These results were called by telephone at the time of interpretation on 12/31/2019 at 5:24 pm to provider [facility] ZAMMIT , who verbally acknowledged these results. Electronically Signed   By: Primitivo Gauze M.D.   On: 12/31/2019 17:46   MR BRAIN W CONTRAST  Result Date: 01/02/2020 CLINICAL DATA:  Baseline evaluation for multiple sclerosis. EXAM: MRI HEAD WITH CONTRAST TECHNIQUE:  Multiplanar, multiecho pulse sequences of the brain and surrounding structures were obtained with intravenous contrast. CONTRAST:  14mL GADAVIST GADOBUTROL 1 MMOL/ML IV SOLN COMPARISON:  MRI 12/31/2018 FINDINGS: Only T1 postcontrast imaging was obtained given MRI brain was performed 1 day earlier. Please see prior MRI for characterization of acute right midbrain infarct and T2/FLAIR hyperintense white matter lesions.  There is no abnormal enhancement on today's study to suggest active demyelination. Punctate left frontal periventricular enhancement is favored vascular when correlating with reformatted imaging. No hydrocephalus. Normal marrow signal. Clear paranasal sinuses.  No orbital mass. No evidence of dural sinus thrombosis. Grossly patent proximal arteries at the skull base. IMPRESSION: Only T1 postcontrast imaging was obtained given MRI brain was performed yesterday. 1. No evidence of active demyelination. Specifically, no enhancement of the T2/FLAIR lesions characterized on yesterday's MRI. 2. Please see prior MRI for characterization of acute right midbrain infarct. Electronically Signed   By: Margaretha Sheffield MD   On: 01/02/2020 09:06   US RENAL  Result Date: 01/01/2020 CLINICAL DATA:  Acute on chronic renal failure, diabetes mellitus EXAM: RENAL / URINARY TRACT ULTRASOUND COMPLETE COMPARISON:  01/23/2015 FINDINGS: Right Kidney: Renal measurements: 10.6 x 5.8 x 6.9 cm = volume: 221 mL. Normal cortical thickness and upper normal cortical echogenicity. No mass, hydronephrosis, or shadowing calcification. Left Kidney: Renal measurements: 11.2 x 6.5 x 5.4 cm = volume: 2 of 5 mL. Normal cortical thickness. Upper normal cortical echogenicity. No mass, hydronephrosis, or shadowing calcification. Bladder: Appears normal for degree of bladder distention. Other: N/A IMPRESSION: No renal sonographic abnormalities. Electronically Signed   By: Lavonia Dana M.D.   On: 01/01/2020 11:25   US Carotid Bilateral (at [facility] and [facility] only)  Result Date: 01/01/2020 CLINICAL DATA:  Acute CVA. EXAM: BILATERAL CAROTID DUPLEX ULTRASOUND TECHNIQUE: Pearline Cables scale imaging, color Doppler and duplex ultrasound were performed of bilateral carotid and vertebral arteries in the neck. COMPARISON:  None. FINDINGS: Criteria: Quantification of carotid stenosis is based on velocity parameters that correlate the residual internal carotid diameter with  NASCET-based stenosis levels, using the diameter of the distal internal carotid lumen as the denominator for stenosis measurement. The following velocity measurements were obtained: RIGHT ICA: 83/35 cm/sec CCA: 836/62 cm/sec SYSTOLIC ICA/CCA RATIO:  0.8 ECA: 90 cm/sec LEFT ICA: 91/39 cm/sec CCA: 94/76 cm/sec SYSTOLIC ICA/CCA RATIO:  0.9 ECA: 79 cm/sec RIGHT CAROTID ARTERY: Minimal plaque or intimal thickening at the right carotid bulb. External carotid artery is patent with normal waveform. Normal waveforms and velocities in the internal carotid artery. RIGHT VERTEBRAL ARTERY: Antegrade flow and normal waveform in the right vertebral artery. LEFT CAROTID ARTERY: Minimal plaque at the left carotid bulb. External carotid artery is patent with normal waveform. Minimal plaque in the proximal internal carotid artery. Normal waveforms and velocities in the internal carotid artery. LEFT VERTEBRAL ARTERY: Antegrade flow and normal waveform in the left vertebral artery. IMPRESSION: 1. Minimal plaque in the carotid arteries, left side greater than right. Estimated degree of stenosis in the internal carotid arteries is less than 50% bilaterally. 2. Patent vertebral arteries with antegrade flow. Electronically Signed   By: Markus Daft M.D.   On: 01/01/2020 15:57   DG Chest Port 1 View  Result Date: 12/31/2019 CLINICAL DATA:  Chest pain EXAM: PORTABLE CHEST 1 VIEW COMPARISON:  Multiple priors, most recent 09/13/2016 FINDINGS: The heart size and mediastinal contours are within normal limits. Both lungs are clear. No pleural effusion. No pneumothorax. The visualized skeletal structures are unremarkable. IMPRESSION: No active  disease. Electronically Signed   By: Margaretha Sheffield MD   On: 12/31/2019 13:17   ECHOCARDIOGRAM COMPLETE  Result Date: 01/01/2020    ECHOCARDIOGRAM REPORT   Patient Name:   Serita Grammes Date of Exam: 01/01/2020 Medical Rec #:  [id number]             Height:       65.0 in Accession #:     [id number]            Weight:       230.0 lb Date of Birth:  1980/11/24             BSA:          2.099 m Patient Age:    43 years              BP:           128/84 mmHg Patient Gender: F                     HR:           89 bpm. Exam Location:  [facility] Procedure: 2D Echo and Intracardiac Opacification Agent Indications:    Stroke 434.91 / I163.9  History:        Patient has no prior history of Echocardiogram examinations.                 Stroke, Signs/Symptoms:Chest Pain; Risk Factors:Diabetes, Former                 Smoker and Hypertension. Acute renal failure superimposed on                 stage 3a chronic kidney disease.  Sonographer:    Leavy Cella RDCS (AE) Referring Phys: [id number] [place]  1. Left ventricular ejection fraction, by estimation, is 55 to 60%. The left ventricle has normal function. The left ventricle demonstrates regional wall motion abnormalities (see scoring diagram/findings for description). There is mild left ventricular  hypertrophy. Left ventricular diastolic parameters were normal. There is moderate hypokinesis of the left ventricular, apical. There is sluggish apical flow without clear formed thrombus.  2. Right ventricular systolic function is normal. The right ventricular size is normal. There is mildly elevated pulmonary artery systolic pressure.  3. The mitral valve is normal in structure. Trivial mitral valve regurgitation. No evidence of mitral stenosis.  4. The inferior vena cava is normal in size with greater than 50% respiratory variability, suggesting right atrial pressure of 3 mmHg.  5. The aortic valve is tricuspid. Aortic valve regurgitation is not visualized. No aortic stenosis is present. FINDINGS  Left Ventricle: Left ventricular ejection fraction, by estimation, is 55 to 60%. The left ventricle has normal function. The left ventricle demonstrates regional wall motion abnormalities. Definity contrast agent was given IV to delineate the left  ventricular endocardial borders. The left ventricular internal cavity size was normal in size. There is mild left ventricular hypertrophy. Left ventricular diastolic parameters were normal. Right Ventricle: The right ventricular size is normal. No increase in right ventricular wall thickness. Right ventricular systolic function is normal. There is mildly elevated pulmonary artery systolic pressure. The tricuspid regurgitant velocity is 2.57  m/s, and with an assumed right atrial pressure of 10 mmHg, the estimated right ventricular systolic pressure is 12.7 mmHg. Left Atrium: Left atrial size was normal in size. Right Atrium: Right atrial size was normal in size. Pericardium: A small pericardial effusion is present. The  pericardial effusion is circumferential. Mitral Valve: The mitral valve is normal in structure. Trivial mitral valve regurgitation. No evidence of mitral valve stenosis. Tricuspid Valve: The tricuspid valve is normal in structure. Tricuspid valve regurgitation is not demonstrated. No evidence of tricuspid stenosis. Aortic Valve: The aortic valve is tricuspid. Aortic valve regurgitation is not visualized. No aortic stenosis is present. Aortic valve mean gradient measures 4.8 mmHg. Aortic valve peak gradient measures 8.7 mmHg. Aortic valve area, by VTI measures 2.04 cm. Pulmonic Valve: The pulmonic valve was not well visualized. Pulmonic valve regurgitation is not visualized. No evidence of pulmonic stenosis. Aorta: The aortic root is normal in size and structure. Venous: The inferior vena cava is normal in size with greater than 50% respiratory variability, suggesting right atrial pressure of 3 mmHg. IAS/Shunts: No atrial level shunt detected by color flow Doppler.  LEFT VENTRICLE PLAX 2D LVIDd:         3.77 cm  Diastology LVIDs:         2.62 cm  LV e' lateral:   9.46 cm/s LV PW:         1.63 cm  LV E/e' lateral: 9.1 LV IVS:        1.15 cm LVOT diam:     1.90 cm LV SV:         54 LV SV Index:   26  LVOT Area:     2.84 cm  RIGHT VENTRICLE RV S prime:     13.20 cm/s TAPSE (M-mode): 2.1 cm LEFT ATRIUM             Index       RIGHT ATRIUM           Index LA diam:        3.70 cm 1.76 cm/m  RA Area:     11.10 cm LA Vol (A2C):   35.2 ml 16.77 ml/m RA Volume:   22.80 ml  10.86 ml/m LA Vol (A4C):   35.9 ml 17.10 ml/m LA Biplane Vol: 36.9 ml 17.58 ml/m  AORTIC VALVE AV Area (Vmax):    2.10 cm AV Area (Vmean):   1.82 cm AV Area (VTI):     2.04 cm AV Vmax:           147.68 cm/s AV Vmean:          102.346 cm/s AV VTI:            0.267 m AV Peak Grad:      8.7 mmHg AV Mean Grad:      4.8 mmHg LVOT Vmax:         109.26 cm/s LVOT Vmean:        65.596 cm/s LVOT VTI:          0.192 m LVOT/AV VTI ratio: 0.72  AORTA Ao Root diam: 2.50 cm MITRAL VALVE               TRICUSPID VALVE MV Area (PHT): 6.27 cm    TR Peak grad:   26.4 mmHg MV Decel Time: 121 msec    TR Vmax:        257.00 cm/s MV E velocity: 86.10 cm/s MV A velocity: 27.00 cm/s  SHUNTS MV E/A ratio:  3.19        Systemic VTI:  0.19 m                            Systemic Diam: 1.90 cm NIKE  MD Electronically signed by Carlyle Dolly MD Signature Date/Time: 01/01/2020/12:41:24 PM    Final     Microbiology: Recent Results (from the past 240 hour(s))  SARS Coronavirus 2 by RT PCR (hospital order, performed in [facility] hospital lab) Nasopharyngeal Nasopharyngeal Swab     Status: None   Collection Time: 12/31/19 12:39 PM   Specimen: Nasopharyngeal Swab  Result Value Ref Range Status   SARS Coronavirus 2 NEGATIVE NEGATIVE Final    Comment: (NOTE) SARS-CoV-2 target nucleic acids are NOT DETECTED.  The SARS-CoV-2 RNA is generally detectable in upper and lower respiratory specimens during the acute phase of infection. The lowest concentration of SARS-CoV-2 viral copies this assay can detect is 250 copies / mL. A negative result does not preclude SARS-CoV-2 infection and should not be used as the sole basis for treatment or other patient  management decisions.  A negative result may occur with improper specimen collection / handling, submission of specimen other than nasopharyngeal swab, presence of viral mutation(s) within the areas targeted by this assay, and inadequate number of viral copies (<250 copies / mL). A negative result must be combined with clinical observations, patient history, and epidemiological information.  Fact Sheet for Patients:   StrictlyIdeas.no  Fact Sheet for Healthcare Providers: BankingDealers.co.za  This test is not yet approved or  cleared by the [place] FDA and has been authorized for detection and/or diagnosis of SARS-CoV-2 by FDA under an Emergency Use Authorization (EUA).  This EUA will remain in effect (meaning this test can be used) for the duration of the COVID-19 declaration under Section 564(b)(1) of the Act, 21 U.S.C. section 360bbb-3(b)(1), unless the authorization is terminated or revoked sooner.  Performed at [facility], [address], [place],  [address]      Labs: Basic Metabolic Panel: Recent Labs  Lab 12/31/19 1620 01/01/20 0319 01/02/20 0543  NA 139 141 139  K 3.9 4.1 4.2  CL 107 110 107  CO2 23 21* 22  GLUCOSE 168* 85 193*  BUN 27* 24* 21*  CREATININE 1.80* 1.62* 1.64*  CALCIUM 8.5* 8.2* 8.2*  MG  --   --  2.0   CBC: Recent Labs  Lab 12/31/19 1620 01/01/20 0319  WBC 14.9* 10.8*  HGB 12.7 11.6*  HCT 39.6 36.6  MCV 90.0 91.5  PLT 439* 374   CBG: Recent Labs  Lab 01/01/20 1200 01/01/20 1752 01/01/20 2119 01/02/20 0758 01/02/20 1146  GLUCAP 235* 316* 250* 201* 338*    Signed:  Barton Dubois MD.  [facility] 01/02/2020, 1:54 PM

## 2020-01-02 NOTE — Progress Notes (Signed)
Inpatient Diabetes Program Recommendations  AACE/ADA: New Consensus Statement on Inpatient Glycemic Control (2015)  Target Ranges:  Prepandial:   less than 140 mg/dL      Peak postprandial:   less than 180 mg/dL (1-2 hours)      Critically ill patients:  140 - 180 mg/dL   Lab Results  Component Value Date   GLUCAP 201 (H) 01/02/2020   HGBA1C 10.3 (H) 12/31/2019    Review of Glycemic Control Results for Lauren [name], Lauren [name] (MRN [id number]) as of 01/02/2020 11:13  Ref. Range 01/01/2020 12:00 01/01/2020 17:52 01/01/2020 21:19 01/02/2020 07:58  Glucose-Capillary Latest Ref Range: 70 - 99 mg/dL 235 (H) 316 (H) 250 (H) 201 (H)   Admit with:Acute ischemic stroke/ CP with Elevated Troponin  History:DM, CKD  Home DM Meds:Lantus 62 units QAM Humalog 5-10 units TID per SSI  Current Orders:Novolog Moderate Correction Scale/ SSI (0-15 units) TID AC + HS, Lantus 20 units QD  If to remain inpatient, consider adding Novolog 3 units TID (assuming patient is consuming >50% of meals).  Of note, correction must be given within one hour of previous CBG to avoid hypoglycemia.   Thanks, Bronson Curb, MSN, RNC-OB Diabetes Coordinator [phone] (8a-5p)

## 2020-01-04 LAB — ANTIPHOSPHOLIPID SYNDROME EVAL, BLD
Anticardiolipin IgA: <9 APL U/mL (ref 0–11)
Anticardiolipin IgG: <9 GPL U/mL (ref 0–14)
Anticardiolipin IgM: 10 MPL U/mL (ref 0–12)
DRVVT: 39.6 s (ref 0.0–47.0)
PTT Lupus Anticoagulant: 36.6 s (ref 0.0–51.9)
Phosphatydalserine, IgA: 1 APS IgA (ref 0–20)
Phosphatydalserine, IgG: 2 GPS IgG (ref 0–11)
Phosphatydalserine, IgM: <22 MPS IgM (ref <22)

## 2020-01-17 ENCOUNTER — Ambulatory Visit: Payer: BLUE CROSS/BLUE SHIELD | Admitting: Family Medicine

## 2020-01-21 ENCOUNTER — Ambulatory Visit (HOSPITAL_COMMUNITY): Payer: BLUE CROSS/BLUE SHIELD | Attending: Internal Medicine | Admitting: Physical Therapy

## 2020-01-21 ENCOUNTER — Other Ambulatory Visit: Payer: Self-pay

## 2020-01-21 ENCOUNTER — Encounter (HOSPITAL_COMMUNITY): Payer: Self-pay | Admitting: Physical Therapy

## 2020-01-21 DIAGNOSIS — M6281 Muscle weakness (generalized): Secondary | ICD-10-CM

## 2020-01-21 NOTE — Patient Instructions (Addendum)
Bridge    Lie back, legs bent. Inhale, pressing hips up. Keeping ribs in, lengthen lower back. Exhale, lower down slowly. Repeat __10__ times. Do __1-2__ sessions per day.  http://pm.exer.us/55   Copyright  VHI. All rights reserved.   POSITION: Single Leg Balance: Neck Rotated / Stance Side    Stand near a countertop for support if needed. Stand on right leg. Look forward. Hold _10-30__ seconds. Repeat on Left leg.  _3__ reps _1-2__ times per day.  http://ggbe.exer.us/19   Copyright  VHI. All rights reserved.  SIT TO STAND: Feet Narrow    Place feet close together. Lean chest forward. Raise hips and straighten knees to stand. _10__ reps per set, _1-2__ sets per day, _7__ days per week. Add a weight as able for increased challenge.   Copyright  VHI. All rights reserved.

## 2020-01-21 NOTE — Therapy (Signed)
[facility] [place], [place], [address] Phone: [phone]   Fax:  [phone]  Physical Therapy Evaluation  Patient Details  Name: Lauren [name] MRN: [id number] Date of Birth: January 20, 1981 Referring Provider (PT): Orson Eva, MD   Encounter Date: 01/21/2020   PT End of Session - 01/21/20 1730    Visit Number 1    Number of Visits 1    Authorization Type BCBS    Authorization - Visit Number 1    Authorization - Number of Visits 30    PT Start Time 4174    PT Stop Time 1430    PT Time Calculation (min) 35 min    Equipment Utilized During Treatment Gait belt    Activity Tolerance Patient tolerated treatment well;No increased pain    Behavior During Therapy WFL for tasks assessed/performed           Past Medical History:  Diagnosis Date  . Diabetes mellitus without complication ([place])   . Myocardial infarction ([place])   . Renal disorder    stage 3 kidney disease    Past Surgical History:  Procedure Laterality Date  . Cardiac catherization    . CHOLECYSTECTOMY    . TUBAL LIGATION      There were no vitals filed for this visit.    Subjective Assessment - 01/21/20 1404    Subjective Patient reports that she had a CVA on 12/31/19. Patient reports that her main concern right now is that the left side of her face is numb, particularly in her mouth which has been ongoing since she had a CVA on 12/31/19. Patient reported that she feels like she may have some left hand weakness as well. She states that overall she feels good and does not think she needs physical therapy.    How long can you stand comfortably? Not limited    How long can you walk comfortably? Not limited    Currently in Pain? No/denies              [facility] PT Assessment - 01/21/20 0001      Assessment   Medical Diagnosis Acute Ischemic Stroke    Referring Provider (PT) Orson Eva, MD    Onset Date/Surgical Date 12/31/19    Next MD Visit 01/27/20     Prior Therapy None, just in hospital      Precautions   Precautions None      Restrictions   Weight Bearing Restrictions No      Balance Screen   Has the patient fallen in the past 6 months No    Has the patient had a decrease in activity level because of a fear of falling?  No    Is the patient reluctant to leave their home because of a fear of falling?  No      Home Environment   Living Environment Private residence    Type of [place] to enter    Entrance Stairs-Number of Steps 6    Entrance Stairs-Rails Can reach both    [place] One level    [place] None      Prior Function   Level of [place] Independent    Vocation --   Patient is out of work until October 11.    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      Cognition   Overall Cognitive Status Within Functional Limits for tasks assessed  Observation/Other Assessments   Focus on Therapeutic Outcomes (FOTO)  N/A one time visit      ROM / Strength   AROM / PROM / Strength Strength      Strength   Overall Strength Comments Grip strength Dynamometer at setting 2: 70# on the LT and 75# on the right    Strength Assessment Site Hip;Knee;Ankle    Right/Left Hip Right;Left    Right Hip Flexion 5/5    Right Hip Extension 4+/5    Right Hip ABduction 5/5    Left Hip Flexion 5/5    Left Hip Extension 4+/5    Left Hip ABduction 5/5    Right/Left Knee Right;Left    Right Knee Flexion 5/5    Right Knee Extension 5/5    Left Knee Flexion 5/5    Left Knee Extension 5/5    Right/Left Ankle Right;Left    Right Ankle Dorsiflexion 5/5    Right Ankle Plantar Flexion 4+/5    Left Ankle Dorsiflexion 5/5    Left Ankle Plantar Flexion 4+/5      Ambulation/Gait   Gait Comments Gait appears [facility]       Balance   Balance Assessed Yes      Static Standing Balance   Static Standing - Balance Support No upper extremity supported    Static Standing Balance -  Activities  Single Leg Stance -  Right Leg;Single Leg Stance - Left Leg    Static Standing - Comment/# of Minutes 10 seconds each LE      Standardized Balance Assessment   Standardized Balance Assessment Dynamic Gait Index      Dynamic Gait Index   Level Surface Normal    Change in Gait Speed Normal    Gait with Horizontal Head Turns Normal    Gait with Vertical Head Turns Normal    Gait and Pivot Turn Normal    Step Over Obstacle Normal    Step Around Obstacles Normal    Steps Normal    Total Score 24                      Objective measurements completed on examination: See above findings.               PT Education - 01/21/20 1729    Education Details Discussed examination findings and HEP.    Person(s) Educated Patient    Methods Explanation;Handout    Comprehension Verbalized understanding            PT Short Term Goals - 01/21/20 1745      PT SHORT TERM GOAL #1   Title Patient will be educated on HEP and report understanding of it.    Time 1    Period Days    Status Achieved    Target Date 01/21/20                     Plan - 01/21/20 1754    Clinical Impression Statement Patient is a 39 year old female who presents to outpatient physical therapy S/P CVA which occurred around December 31, 2019. Patient's primary complaints are with her facial numbness which has persisted. Patient reports that she feels that her balance is as good as it was before the CVA. Upon examination, patient demonstrates overall good strength with minimal deficits. She demonstrates balance that is good on DGI and WFL on SLS and patient reports that this is similar to her balance level prior to  her CVA. Patient's grip strength was relatively equal from one side to the other. Discussed with patient whether she felt that she was limited in any ADLs or hand weakness and patient reported she did not feel that she had any deficits that warranted an OT referral. Overall, patient's functional mobility is  at her baseline and do not recommend further skilled physical therapy at this time. Educated patient on HEP to be performed to improve minimal deficits in strength and balance. Patient consented to this plan.    Personal Factors and Comorbidities Comorbidity 3+    Comorbidities S/P CVA, HTN, MI, DM I    Stability/Clinical Decision Making Stable/Uncomplicated    Clinical Decision Making Low    PT Frequency One time visit    PT Treatment/Interventions ADLs/Self Care Home Management;Patient/family education    PT Next Visit Plan No PT follow up    PT Home Exercise Plan 01/21/20: Lauren [name], STS, SLS at counter    Consulted and Agree with Plan of Care Patient           Patient will benefit from skilled therapeutic intervention in order to improve the following deficits and impairments:  Decreased strength  Visit Diagnosis: Muscle weakness (generalized)     Problem List Patient Active Problem List   Diagnosis Date Noted  . Mixed hyperlipidemia   . Class 2 obesity due to excess calories without serious comorbidity with body mass index (BMI) of 39.0 to 39.9 in adult   . Acute renal failure superimposed on stage 3a chronic kidney disease ([place]) 01/01/2020  . Acute ischemic stroke ([place]) 01/01/2020  . Tobacco abuse 01/01/2020  . Chest pain 12/31/2019  . Cerebrovascular accident (CVA) ([place]) 12/31/2019  . Left sided numbness 12/31/2019  . Vitamin D deficiency 07/24/2015  . Essential hypertension 03/17/2015  . 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 Hypothyroidism 03/07/2015   Clarene Critchley PT, DPT 5:57 PM, 01/21/20 [place], [place], [address] Phone: [phone]   Fax:  [phone]  Name: Lauren [name] MRN: [id number] Date of Birth: 1981/02/20

## 2020-01-28 NOTE — Progress Notes (Signed)
Cardiology Office Note  Date: 01/29/2020   ID: Lauren [name], DOB 30-Aug-1980, MRN [id number]  PCP:  Lemmie Evens, MD  Cardiologist:  Carlyle Dolly, MD Electrophysiologist:  None   Chief Complaint: hospital follow up CVA  History of Present Illness: Lauren [name] is a 39 y.o. female with a history of CVA, MI,DM2, CKD3, HTN, hypothyroidism, chest pain, left-sided numbness, acute renal failure superimposed on stage III chronic kidney disease, tobacco abuse, mixed hyperlipidemia, class II obesity.  Presented to ED on 12/31/2019 c/o numbness to face, left arm, left leg, and chest discomfort. MRI showed acute CVA of midbrain. MRA negative for large vessel occlusion. Trop 410. Had episode of chest tightness/heaviness like bricks setting on her chest with vomiting.  She was admitted for CVA.  She was given aspirin and nitroglycerin for increased troponin. 2D echo showed no emboli and preserved EF with moderate hypokinesis of LV but did have some wall motion abnormalities.  Due to chest pain and elevated troponins with history of CAD in the past, cardiology was consulted.  Diabetes was poorly controlled with A1c of 10.3 which was down from previous A1c of 13 with nephropathy.  Baseline creatinine 1.3-1.6.  Serum creatinine on arrival was 1.8.  However creatinine returned to baseline after fluid resuscitation.  She was started on statin and fenofibrate at discharge.  Her thyroid function was normal and she continued Synthroid at discharge.  She was started on B12 repletion after B12 deficiency.  Patient had been smoking for years prior to admission.  She presents today with no noticeable focal neurologic deficits.  Speech is appropriate and no aphasia or slurred speech noted.  Responding appropriately to questions.  Denies any unilateral weakness, blurred vision.  Still complains of some facial numbness and some left arm numbness.  States she continues with some dyspnea on exertion  and occasional mild chest pain.  She had elevated troponins upon presentation to ED on August 30 as well as nausea and vomiting with chest discomfort.  History of previous MI.  Blood pressure is low today at 92/60.  She states her primary care provider increased her Toprol dose to 25 mg daily.  States she is having some increasing activity intolerance, as well as exertional and nonexertional fatigue.  States she just feels tired all the time since getting out of the hospital.  States she has not smoked since discharge from the hospital and does not plan on resuming.  She is complaining of some lower extremity pain when ambulating.  States this usually resolves when resting.  She denies any PND or orthopnea, palpitations or arrhythmias.  No DVT or PE-like symptoms or lower extremity edema noted.     Past Medical History:  Diagnosis Date  . Diabetes mellitus without complication ([place])   . Myocardial infarction ([place])   . Renal disorder    stage 3 kidney disease    Past Surgical History:  Procedure Laterality Date  . Cardiac catherization    . CHOLECYSTECTOMY    . TUBAL LIGATION      Current Outpatient Medications  Medication Sig Dispense Refill  . aspirin EC 81 MG EC tablet Take 1 tablet (81 mg total) by mouth daily. Swallow whole. 30 tablet 0  . atorvastatin (LIPITOR) 40 MG tablet Take 1 tablet (40 mg total) by mouth daily. 30 tablet 1  . clopidogrel (PLAVIX) 75 MG tablet Take 1 tablet (75 mg total) by mouth daily. 30 tablet 3  . cyanocobalamin (,VITAMIN B-12,) 1000 MCG/ML injection  Inject 1 mL daily for 6 days; then weekly for a month and then monthly after that. 13 mL 0  . fenofibrate 160 MG tablet Take 1 tablet (160 mg total) by mouth daily. 30 tablet 1  . furosemide (LASIX) 40 MG tablet Take 40 mg by mouth daily as needed for fluid.     Marland Kitchen glucagon (GLUCAGON EMERGENCY) 1 MG injection Inject 1 mg into the vein once as needed. 1 each 12  . insulin glargine (LANTUS SOLOSTAR) 100 UNIT/ML  Solostar Pen Inject 62 Units into the skin in the morning.    . insulin lispro (HUMALOG) 100 UNIT/ML injection Inject 5-10 Units into the skin 3 (three) times daily before meals.    Marland Kitchen levothyroxine (SYNTHROID) 175 MCG tablet Take 175 mcg by mouth daily before breakfast.   10  . metoprolol succinate (TOPROL-XL) 25 MG 24 hr tablet Take 0.5 tablets (12.5 mg total) by mouth daily.    . pantoprazole (PROTONIX) 40 MG tablet Take 40 mg by mouth daily as needed (for acid reflux).     . potassium chloride SA (K-DUR,KLOR-CON) 20 MEQ tablet Take 20 mEq by mouth daily as needed (When taking Lasix (Furosemide)).      No current facility-administered medications for this visit.   Allergies:  Wellbutrin [bupropion], Ciprofloxacin hcl, and Tape   Social History: The patient  reports that she has quit smoking. Her smoking use included cigarettes. She smoked 1.00 pack per day. She has never used smokeless tobacco. She reports that she does not drink alcohol and does not use drugs.   Family History: The patient's family history includes CAD in her father and mother; CVA in her mother; Hyperlipidemia in her father and mother; Hypertension in her father and mother; Thyroid disease in her mother.   ROS:  Please see the history of present illness. Otherwise, complete review of systems is positive for none.  All other systems are reviewed and negative.   Physical Exam: VS:  BP 92/60   Pulse 65   Ht 5\' 5"  (1.651 m)   Wt 232 lb (105.2 kg)   SpO2 98%   BMI 38.61 kg/m , BMI Body mass index is 38.61 kg/m.  Wt Readings from Last 3 Encounters:  01/29/20 232 lb (105.2 kg)  01/01/20 234 lb 14.4 oz (106.5 kg)  04/03/19 230 lb (104.3 kg)    General: Patient appears comfortable at rest. Neck: Supple, no elevated JVP or carotid bruits, no thyromegaly. Lungs: Clear to auscultation, nonlabored breathing at rest. Cardiac: Regular rate and rhythm, no S3 or significant systolic murmur, no pericardial rub. Extremities: No  pitting edema, distal pulses 2+. Skin: Warm and dry. Musculoskeletal: No kyphosis. Neuropsychiatric: Alert and oriented x3, affect grossly appropriate.  ECG:  EKG 12/31/2019 sinus tachycardia rate of 110, anterolateral infarct age undetermined.  Recent Labwork: 01/01/2020: Hemoglobin 11.6; Platelets 374 01/02/2020: BUN 21; Creatinine, Ser 1.64; Magnesium 2.0; Potassium 4.2; Sodium 139     Component Value Date/Time   CHOL 193 01/01/2020 0319   TRIG 416 (H) 01/01/2020 0319   HDL 25 (L) 01/01/2020 0319   CHOLHDL 7.7 01/01/2020 0319   VLDL UNABLE TO CALCULATE IF TRIGLYCERIDE OVER 400 mg/dL 01/01/2020 0319   LDLCALC UNABLE TO CALCULATE IF TRIGLYCERIDE OVER 400 mg/dL 01/01/2020 0319   LDLDIRECT 84.9 01/01/2020 0319    Other Studies Reviewed Today:   MRI brain without contrast 12/31/2019 IMPRESSION:  Acute right midbrain infarct measuring 8 mm.  Supratentorial white matter FLAIR hyperintensities. Differential includes demyelination versus chronic microvascular  ischemic changes.  Motion artifact limits evaluation on time-of-flight. Moderate left and mild right supraclinoid ICA narrowing.  No large vessel occlusion.  These results were called by telephone at the time of interpretation on 12/31/2019 at 5:24 pm to provider JOSEPH ZAMMIT , who verbally acknowledged these resu    8/31/2021Us carotid bilateral: 1. Minimal plaque in the carotid arteries, left side greater than right. Estimated degree of stenosis in the internal carotid arteries is less than 50% bilaterally. 2. Patent vertebral arteries with antegrade flow.   01/01/2020 2D echo: 1. Left ventricular ejection fraction, by estimation, is 55 to 60%. The  left ventricle has normal function. The left ventricle demonstrates  regional wall motion abnormalities (see scoring diagram/findings for  description). There is mild left ventricular  hypertrophy. Left ventricular diastolic parameters were normal. There is    moderate hypokinesis of the left ventricular, apical. There is sluggish  apical flow without clear formed thrombus.  2. Right ventricular systolic function is normal. The right ventricular  size is normal. There is mildly elevated pulmonary artery systolic  pressure.  3. The mitral valve is normal in structure. Trivial mitral valve  regurgitation. No evidence of mitral stenosis.  4. The inferior vena cava is normal in size with greater than 50%  respiratory variability, suggesting right atrial pressure of 3 mmHg.  5. The aortic valve is tricuspid. Aortic valve regurgitation is not  visualized. No aortic stenosis is present.    Assessment and Plan:  1. Cerebrovascular accident (CVA), unspecified mechanism ([place])   2. Chest pain, unspecified type   3. CAD in native artery   4. Type 2 diabetes mellitus without complication, with long-term current use of insulin ([facility])   5. Other chest pain   6. Pain of lower extremity, unspecified laterality   7. SOB (shortness of breath)   8. Essential hypertension   9. Smoking    1. Cerebrovascular accident (CVA), unspecified mechanism ([place]) Recent admission with facial numbness, left arm, and left leg numbness. No aphasia, slurred speech, or unilateral weakness. No noticeable focal neurological deficits on exam. Patient states she still has facial and left arm numbness. Continue ASA 81 mg po daily, atorvastatin 40 mg po daily, Plavix 75 mg po daily.  2. Chest pain, unspecified type Patient had elevated troponins 413>367 with chest pain, nausea, and vomiting at recent admission for CVA.  Continue ASA 81 mg po daily, atorvastatin 40 mg po daily. Plavix for CVA prevention. No current chest pain. Given hx of CAD and multiple risk factors,  Get  Lexiscan Stress test   3. CAD in native artery Previous MI with stent placement. Recent admission for CVA. Presented with chest pain, nausea, vomiting and elevated troponins 413>367. We are ordering Lexiscan  stress test as noted above. Continue ASA 81 mg, atorvastatin 40 mg po daily. Taking plavix for CVA prevention.   4. Type 2 diabetes mellitus without complication, with long-term current use of insulin ([facility])  Diabetes poorly controlled with A1c of 10.3 % down from previous A1c of 13 % with nephropathy. Follow with PCP.  6. Pain of lower extremity, unspecified laterality Complaining of bilateral leg pain on ambulation relieved with rest. Please get Lower extremity arterial duplex with ABI's  6. SOB (shortness of breath) Echo 01/01/2020 EF 55 to 60%.  Positive WMA's, mild LVH, moderate LV hypokinesis on CT, sluggish apical flow without clear prolonged thrombus, mildly elevated PASP, trivial MR. Patient has long hx of smoking and states she has stopped since recent admission.  We are ordering Nuclear stress test as noted above. May need to refer to pulmonology pending stress test results. She states she is having increased exertional dyspnea and fatigue with minimal to moderate activity.  8. Essential hypertension Patient's BP was low today 92/60. Complaining of more fatigue. She states her PCP had recently increased her Toprol XL to 25 mg daily. Decrease Toprol XL to 12.5 mg po daily.  9. Smoking Long history of smoking. Patient states she stopped after recent admission and CVA. Has no plans to re-start.  10. LE edema Mild LE edema noted. Continue Lasix 40 mg po prn edema/fluid  Medication Adjustments/Labs and Tests Ordered: Current medicines are reviewed at length with the patient today.  Concerns regarding medicines are outlined above.   Disposition: Follow-up with Dr Harl Bowie or APP 4 to 6 weeks  Signed, Levell July, NP 01/29/2020 5:31 PM    [facility] [place], [place],  [address] Phone: [phone]; Fax: [phone]

## 2020-01-29 ENCOUNTER — Ambulatory Visit (INDEPENDENT_AMBULATORY_CARE_PROVIDER_SITE_OTHER): Payer: BLUE CROSS/BLUE SHIELD | Admitting: Family Medicine

## 2020-01-29 ENCOUNTER — Encounter: Payer: Self-pay | Admitting: Family Medicine

## 2020-01-29 ENCOUNTER — Telehealth: Payer: Self-pay | Admitting: Family Medicine

## 2020-01-29 ENCOUNTER — Encounter: Payer: Self-pay | Admitting: *Deleted

## 2020-01-29 VITALS — BP 92/60 | HR 65 | Ht 65.0 in | Wt 232.0 lb

## 2020-01-29 DIAGNOSIS — E119 Type 2 diabetes mellitus without complications: Secondary | ICD-10-CM | POA: Diagnosis not present

## 2020-01-29 DIAGNOSIS — F17200 Nicotine dependence, unspecified, uncomplicated: Secondary | ICD-10-CM

## 2020-01-29 DIAGNOSIS — R079 Chest pain, unspecified: Secondary | ICD-10-CM | POA: Diagnosis not present

## 2020-01-29 DIAGNOSIS — IMO0001 Reserved for inherently not codable concepts without codable children: Secondary | ICD-10-CM

## 2020-01-29 DIAGNOSIS — I639 Cerebral infarction, unspecified: Secondary | ICD-10-CM

## 2020-01-29 DIAGNOSIS — R0602 Shortness of breath: Secondary | ICD-10-CM

## 2020-01-29 DIAGNOSIS — R0789 Other chest pain: Secondary | ICD-10-CM

## 2020-01-29 DIAGNOSIS — M79606 Pain in leg, unspecified: Secondary | ICD-10-CM

## 2020-01-29 DIAGNOSIS — Z794 Long term (current) use of insulin: Secondary | ICD-10-CM

## 2020-01-29 DIAGNOSIS — I2510 Atherosclerotic heart disease of native coronary artery without angina pectoris: Secondary | ICD-10-CM | POA: Diagnosis not present

## 2020-01-29 DIAGNOSIS — I10 Essential (primary) hypertension: Secondary | ICD-10-CM

## 2020-01-29 MED ORDER — METOPROLOL SUCCINATE ER 25 MG PO TB24: 12.5000 mg | ORAL_TABLET | Freq: Every day | ORAL | Status: DC

## 2020-01-29 NOTE — Telephone Encounter (Signed)
Pre-cert Verification for the following procedure    LEXISCAN MYOVIEW   DATE: 10-01   [place]

## 2020-01-29 NOTE — Patient Instructions (Signed)
Medication Instructions:   Decrease Toprol XL to 12.5mg  daily.   Continue all other medications.    Labwork: none  Testing/Procedures:  Your physician has requested that you have an ankle brachial index (ABI). During this test an ultrasound and blood pressure cuff are used to evaluate the arteries that supply the arms and legs with blood. Allow thirty minutes for this exam. There are no restrictions or special instructions.  Your physician has requested that you have a lower extremity arterial duplex. During this test, ultrasound are used to evaluate arterial blood flow in the legs. Allow one hour for this exam. There are no restrictions or special instructions.  Your physician has requested that you have a lexiscan myoview. For further information please visit [email or web address]. Please follow instruction sheet, as given.  Office will contact with results via phone or letter.    Follow-Up: 4-6 weeks   Any Other Special Instructions Will Be Listed Below (If Applicable).  If you need a refill on your cardiac medications before your next appointment, please call your pharmacy.

## 2020-01-30 ENCOUNTER — Other Ambulatory Visit: Payer: Self-pay | Admitting: Family Medicine

## 2020-01-30 DIAGNOSIS — I739 Peripheral vascular disease, unspecified: Secondary | ICD-10-CM

## 2020-02-01 ENCOUNTER — Other Ambulatory Visit: Payer: Self-pay

## 2020-02-01 ENCOUNTER — Ambulatory Visit (HOSPITAL_BASED_OUTPATIENT_CLINIC_OR_DEPARTMENT_OTHER)
Admission: RE | Admit: 2020-02-01 | Discharge: 2020-02-01 | Disposition: A | Discharge status: Home / Self Care | Payer: BLUE CROSS/BLUE SHIELD | Source: Ambulatory Visit | Attending: Family Medicine | Admitting: Family Medicine

## 2020-02-01 ENCOUNTER — Other Ambulatory Visit (HOSPITAL_COMMUNITY)
Admission: RE | Admit: 2020-02-01 | Discharge: 2020-02-01 | Disposition: A | Discharge status: Home / Self Care | Payer: BLUE CROSS/BLUE SHIELD | Source: Ambulatory Visit | Attending: Internal Medicine | Admitting: Internal Medicine

## 2020-02-01 ENCOUNTER — Ambulatory Visit (HOSPITAL_COMMUNITY)
Admission: RE | Admit: 2020-02-01 | Discharge: 2020-02-01 | Disposition: A | Discharge status: Home / Self Care | Payer: BLUE CROSS/BLUE SHIELD | Source: Ambulatory Visit | Attending: Family Medicine | Admitting: Family Medicine

## 2020-02-01 DIAGNOSIS — R079 Chest pain, unspecified: Secondary | ICD-10-CM

## 2020-02-01 DIAGNOSIS — I10 Essential (primary) hypertension: Secondary | ICD-10-CM | POA: Insufficient documentation

## 2020-02-01 DIAGNOSIS — R0602 Shortness of breath: Secondary | ICD-10-CM | POA: Insufficient documentation

## 2020-02-01 LAB — NM MYOCAR MULTI W/SPECT W/WALL MOTION / EF
LV dias vol: 118 mL (ref 46–106)
LV sys vol: 62 mL
Peak HR: 110 {beats}/min
RATE: 0.48
Rest HR: 78 {beats}/min
SDS: 3
SRS: 26
SSS: 29
TID: 1.24

## 2020-02-01 LAB — BASIC METABOLIC PANEL
Anion gap: 7 (ref 5–15)
BUN: 28 mg/dL — ABNORMAL HIGH (ref 6–20)
CO2: 23 mmol/L (ref 22–32)
Calcium: 8.2 mg/dL — ABNORMAL LOW (ref 8.9–10.3)
Chloride: 104 mmol/L (ref 98–111)
Creatinine, Ser: 2.25 mg/dL — ABNORMAL HIGH (ref 0.44–1.00)
GFR calc Af Amer: 31 mL/min — ABNORMAL LOW (ref >60)
GFR calc non Af Amer: 27 mL/min — ABNORMAL LOW (ref >60)
Glucose, Bld: 341 mg/dL — ABNORMAL HIGH (ref 70–99)
Potassium: 4.4 mmol/L (ref 3.5–5.1)
Sodium: 134 mmol/L — ABNORMAL LOW (ref 135–145)

## 2020-02-01 MED ORDER — TECHNETIUM TC 99M TETROFOSMIN IV KIT
30.0000 | PACK | Freq: Once | INTRAVENOUS | Status: AC | PRN
  Administered 2020-02-01: 28.9 via INTRAVENOUS

## 2020-02-01 MED ORDER — TECHNETIUM TC 99M TETROFOSMIN IV KIT
10.0000 | PACK | Freq: Once | INTRAVENOUS | Status: AC | PRN
  Administered 2020-02-01: 10 via INTRAVENOUS

## 2020-02-01 MED ORDER — REGADENOSON 0.4 MG/5ML IV SOLN
INTRAVENOUS | Status: AC
  Administered 2020-02-01: 0.4 mg via INTRAVENOUS
  Filled 2020-02-01: qty 5

## 2020-02-01 MED ORDER — SODIUM CHLORIDE FLUSH 0.9 % IV SOLN
INTRAVENOUS | Status: AC
  Administered 2020-02-01: 10 mL via INTRAVENOUS
  Filled 2020-02-01: qty 10

## 2020-02-04 ENCOUNTER — Telehealth: Payer: Self-pay

## 2020-02-04 ENCOUNTER — Other Ambulatory Visit: Payer: Self-pay

## 2020-02-04 NOTE — Telephone Encounter (Signed)
Contacted patient with the results of her abnormal stress test.  She will need to see Dr. Harl Bowie as soon as possible to set up a cardiac catheterization.  Patient aware.

## 2020-02-04 NOTE — Telephone Encounter (Signed)
Spoke with patient regarding her elevated glucose and abnormal creatine and GFR.  She is agreeable to referral to Dr. Dorris Fetch for management of her diabetes and Dr. Theador Hawthorne for management of her kidney disease.  We will process the referrals accordingly.

## 2020-02-10 NOTE — Progress Notes (Signed)
Cardiology Office Note  Date: 02/11/2020   ID: Lauren [name], DOB [birth year], MRN [id number]  PCP:  Lemmie Evens, MD   Cardiologist:  Carlyle Dolly, MD Electrophysiologist:  None   Chief Complaint: hospital follow up CVA  History of Present Illness: Lauren [name] is a 39 y.o. female with a history of CVA, MI,DM2, CKD3, HTN, hypothyroidism, chest pain, left-sided numbness, acute renal failure superimposed on stage III chronic kidney disease, tobacco abuse, mixed hyperlipidemia, class II obesity.  Presented to ED on 12/31/2019 c/o numbness to face, left arm, left leg, and chest discomfort. MRI showed acute CVA of midbrain. MRA negative for large vessel occlusion. Trop 410. Had episode of chest tightness/heaviness like bricks setting on her chest with vomiting.  She was admitted for CVA.  She was given aspirin and nitroglycerin for increased troponin. 2D echo showed no emboli and preserved EF with moderate hypokinesis of LV but did have some wall motion abnormalities.  Due to chest pain and elevated troponins with history of CAD in the past, cardiology was consulted.  Diabetes was poorly controlled with A1c of 10.3 which was down from previous A1c of 13 with nephropathy.  Baseline creatinine 1.3-1.6.  Serum creatinine on arrival was 1.8.  However creatinine returned to baseline after fluid resuscitation.  She was started on statin and fenofibrate at discharge.  Her thyroid function was normal and she continued Synthroid at discharge.  She was started on B12 repletion after B12 deficiency.  Patient had been smoking for years prior to admission.  She presented at last visit with no noticeable focal neurologic deficits.  Speech was appropriate and no aphasia or slurred speech noted.  Responded appropriately to questions.  Denied any unilateral weakness, blurred vision.  Still complain of some facial numbness and some left arm numbness.  She continued with some dyspnea on exertion  and occasional mild chest pain.  She had elevated troponins upon presentation to ED on August 30 as well as nausea and vomiting with chest discomfort.  History of previous MI.  Blood pressure was low at 92/60.  She stated her primary care provider increased her Toprol dose to 25 mg daily.  Stated she is having some increasing activity intolerance, as well as exertional and nonexertional fatigue.  Stated she just feels tired all the time since getting out of the hospital.  States she has not smoked since discharge from the hospital and does not plan on resuming.  She was complaining of some lower extremity pain when ambulating.  Stated this usually resolves when resting.  She denied any PND or orthopnea, palpitations or arrhythmias.  No DVT or PE-like symptoms or lower extremity edema noted.  Here today for follow-up status post recent stress test results.  She continues to complain of some moderate shortness of breath on mild to moderate activity otherwise no chest pain, pressure, tightness, radiation to neck, arm, back, jaw.  No associated nausea, vomiting, diaphoresis.  She recently quit smoking after last hospital admission for CVA.  She has a pending ABI for lower extremity pain.  Recently saw her PCP who she states adjusted some of her diabetic medication until she sees endocrinology.  She stated she does feel like she has a little more energy recently.  Continues not to smoke.  No noticeable focal neurologic deficits from recent CVA.  No orthostatic symptoms, PND, orthopnea, lower extremity edema.   Past Medical History:  Diagnosis Date  . Diabetes mellitus without complication ([facility])   . Myocardial  infarction ([place])   . Renal disorder    stage 3 kidney disease    Past Surgical History:  Procedure Laterality Date  . Cardiac catherization    . CHOLECYSTECTOMY    . TUBAL LIGATION      Current Outpatient Medications  Medication Sig Dispense Refill  . aspirin EC 81 MG EC tablet Take 1 tablet  (81 mg total) by mouth daily. Swallow whole. 30 tablet 0  . atorvastatin (LIPITOR) 40 MG tablet Take 1 tablet (40 mg total) by mouth daily. 30 tablet 1  . clopidogrel (PLAVIX) 75 MG tablet Take 1 tablet (75 mg total) by mouth daily. 30 tablet 3  . cyanocobalamin (,VITAMIN B-12,) 1000 MCG/ML injection Inject 1 mL daily for 6 days; then weekly for a month and then monthly after that. 13 mL 0  . fenofibrate 160 MG tablet Take 1 tablet (160 mg total) by mouth daily. 30 tablet 1  . furosemide (LASIX) 40 MG tablet Take 40 mg by mouth daily as needed for fluid.     Marland Kitchen glucagon (GLUCAGON EMERGENCY) 1 MG injection Inject 1 mg into the vein once as needed. 1 each 12  . insulin glargine (LANTUS SOLOSTAR) 100 UNIT/ML Solostar Pen Inject 62 Units into the skin in the morning.    . insulin lispro (HUMALOG) 100 UNIT/ML injection Inject 5-10 Units into the skin 3 (three) times daily before meals.    Marland Kitchen levothyroxine (SYNTHROID) 175 MCG tablet Take 175 mcg by mouth daily before breakfast.   10  . metoprolol succinate (TOPROL-XL) 25 MG 24 hr tablet Take 0.5 tablets (12.5 mg total) by mouth daily.    . pantoprazole (PROTONIX) 40 MG tablet Take 40 mg by mouth daily as needed (for acid reflux).     . potassium chloride SA (K-DUR,KLOR-CON) 20 MEQ tablet Take 20 mEq by mouth daily as needed (When taking Lasix (Furosemide)).     . isosorbide mononitrate (IMDUR) 30 MG 24 hr tablet Take 1 tablet (30 mg total) by mouth daily. 30 tablet 6  . nitroGLYCERIN (NITROSTAT) 0.4 MG SL tablet Place 1 tablet (0.4 mg total) under the tongue every 5 (five) minutes as needed for chest pain. 25 tablet 3   No current facility-administered medications for this visit.   Allergies:  Wellbutrin [bupropion], Ciprofloxacin hcl, and Tape   Social History: The patient  reports that she has quit smoking. Her smoking use included cigarettes. She smoked 1.00 pack per day. She has never used smokeless tobacco. She reports that she does not drink  alcohol and does not use drugs.   Family History: The patient's family history includes CAD in her father and mother; CVA in her mother; Hyperlipidemia in her father and mother; Hypertension in her father and mother; Thyroid disease in her mother.   ROS:  Please see the history of present illness. Otherwise, complete review of systems is positive for none.  All other systems are reviewed and negative.   Physical Exam: VS:  BP 118/80   Pulse 100   Ht 5\' 5"  (1.651 m)   Wt 226 lb 12.8 oz (102.9 kg)   SpO2 99%   BMI 37.74 kg/m , BMI Body mass index is 37.74 kg/m.  Wt Readings from Last 3 Encounters:  02/11/20 226 lb 12.8 oz (102.9 kg)  01/29/20 232 lb (105.2 kg)  01/01/20 234 lb 14.4 oz (106.5 kg)    General: Patient appears comfortable at rest. Neck: Supple, no elevated JVP or carotid bruits, no thyromegaly. Lungs: Clear to  auscultation, nonlabored breathing at rest. Cardiac: Regular rate and rhythm, no S3 or significant systolic murmur, no pericardial rub. Extremities: No pitting edema, distal pulses 2+. Skin: Warm and dry. Musculoskeletal: No kyphosis. Neuropsychiatric: Alert and oriented x3, affect grossly appropriate.  ECG:  EKG 12/31/2019 sinus tachycardia rate of 110, anterolateral infarct age undetermined.  Recent Labwork: 01/01/2020: Hemoglobin 11.6; Platelets 374 01/02/2020: Magnesium 2.0 02/01/2020: BUN 28; Creatinine, Ser 2.25; Potassium 4.4; Sodium 134     Component Value Date/Time   CHOL 193 01/01/2020 0319   TRIG 416 (H) 01/01/2020 0319   HDL 25 (L) 01/01/2020 0319   CHOLHDL 7.7 01/01/2020 0319   VLDL UNABLE TO CALCULATE IF TRIGLYCERIDE OVER 400 mg/dL 01/01/2020 0319   LDLCALC UNABLE TO CALCULATE IF TRIGLYCERIDE OVER 400 mg/dL 01/01/2020 0319   LDLDIRECT 84.9 01/01/2020 0319    Other Studies Reviewed Today:  NST 02/01/2020 Study Result  Narrative & Impression   No diagnostic ST segment changes to indicate ischemia.  Large, severe intensity, periapical  defect extending into the anterolateral wall and inferior septum that is largely fixed and consistent with infarct scar. There is a small region of apical anterolateral reversibility indicating a mild ischemic territory.  This is a high risk study based on defect size although region is most consistent with scar and a mild region of peri-infarct ischemia.  Nuclear stress EF: 48%. Large area of periapical akinesis.       MRI brain without contrast 12/31/2019 IMPRESSION:  Acute right midbrain infarct measuring 8 mm.  Supratentorial white matter FLAIR hyperintensities. Differential includes demyelination versus chronic microvascular ischemic changes.  Motion artifact limits evaluation on time-of-flight. Moderate left and mild right supraclinoid ICA narrowing.  No large vessel occlusion.  These results were called by telephone at the time of interpretation on 12/31/2019 at 5:24 pm to provider JOSEPH ZAMMIT , who verbally acknowledged these resu    8/31/2021Us carotid bilateral: 1. Minimal plaque in the carotid arteries, left side greater than right. Estimated degree of stenosis in the internal carotid arteries is less than 50% bilaterally. 2. Patent vertebral arteries with antegrade flow.   01/01/2020 2D echo: 1. Left ventricular ejection fraction, by estimation, is 55 to 60%. The  left ventricle has normal function. The left ventricle demonstrates  regional wall motion abnormalities (see scoring diagram/findings for  description). There is mild left ventricular  hypertrophy. Left ventricular diastolic parameters were normal. There is  moderate hypokinesis of the left ventricular, apical. There is sluggish  apical flow without clear formed thrombus.  2. Right ventricular systolic function is normal. The right ventricular  size is normal. There is mildly elevated pulmonary artery systolic  pressure.  3. The mitral valve is normal in structure. Trivial mitral valve    regurgitation. No evidence of mitral stenosis.  4. The inferior vena cava is normal in size with greater than 50%  respiratory variability, suggesting right atrial pressure of 3 mmHg.  5. The aortic valve is tricuspid. Aortic valve regurgitation is not  visualized. No aortic stenosis is present.    Assessment and Plan:   1. Cerebrovascular accident (CVA), unspecified mechanism ([place]) Recent admission with facial numbness, left arm, and left leg numbness. No aphasia, slurred speech, or unilateral weakness. No noticeable focal neurological deficits on exam. Patient states she still has facial and left arm numbness. Continue ASA 81 mg po daily, atorvastatin 40 mg po daily, Plavix 75 mg po daily.  2. Chest pain, unspecified type Patient had elevated troponins 413>367 with chest pain, nausea,  and vomiting at recent admission for CVA.  Continue ASA 81 mg po daily, atorvastatin 40 mg po daily. Plavix for CVA prevention. No current chest pain. Given hx of CAD and multiple risk factors.  Recent stress test showed large severe intensity apical defect extending into the anterolateral wall and inferior septum largely fixed and consistent with infarct scar.  Small region of apical anterolateral reversibility indicating a mild ischemic territory.  This was considered high risk based on defect size although the reading was most consistent with scar and a mild region of peri-infarct ischemia.  Calculated EF was 48% large area of periapical akinesis.  Add Imdur 30 mg daily.  Add nitroglycerin sublingual as needed.  3. CAD in native artery Previous MI with stent placement. Recent admission for CVA. Presented with chest pain, nausea, vomiting and elevated troponins 413>367.  Continue ASA 81 mg, atorvastatin 40 mg po daily. Taking plavix for CVA prevention.  Added Imdur 30 mg daily and nitroglycerin sublingual as needed to current regimen today.  4. Type 2 diabetes mellitus without complication, with long-term  current use of insulin (H[facility])  Diabetes poorly controlled with A1c of 10.3 % down from previous A1c of 13 % with nephropathy. Follow with PCP.  6. Pain of lower extremity, unspecified laterality Complained at last visit of bilateral leg pain on ambulation relieved with rest.  She has a pending lower extremity arterial duplex and ABIs  6. SOB (shortness of breath) Echo 01/01/2020 EF 55 to 60%.  Positive WMA's, mild LVH, moderate LV hypokinesis on CT, sluggish apical flow without clear prolonged thrombus, mildly elevated PASP, trivial MR. Patient has long hx of smoking and states she has stopped since recent admission. May need to refer to pulmonology if dyspnea continues and no cardiac etiology to explain symptoms.  She is a long-term smoker who recently quit after her recent diagnosis of CVA.  She stated at last visit she was having increased exertional dyspnea and fatigue with minimal to moderate activity.  8. Essential hypertension Patient's BP was low today 92/60. Complaining of more fatigue. She states her PCP had recently increased her Toprol XL to 25 mg daily. Decrease Toprol XL to 12.5 mg po daily.  9. Smoking Long history of smoking. Patient states she stopped after recent admission and CVA. Has no plans to re-start.  10. LE edema Mild LE edema noted. Continue Lasix 40 mg po prn edema/fluid  Medication Adjustments/Labs and Tests Ordered: Current medicines are reviewed at length with the patient today.  Concerns regarding medicines are outlined above.   Disposition: Follow-up with Dr Harl Bowie or APP 3 months Signed, Levell July, NP 02/11/2020 9:20 AM    [facility] [place], [place], [place] [address] Phone: [phone]; Fax: [phone]

## 2020-02-11 ENCOUNTER — Ambulatory Visit (INDEPENDENT_AMBULATORY_CARE_PROVIDER_SITE_OTHER): Payer: BLUE CROSS/BLUE SHIELD | Admitting: Family Medicine

## 2020-02-11 ENCOUNTER — Encounter: Payer: Self-pay | Admitting: Family Medicine

## 2020-02-11 VITALS — BP 118/80 | HR 100 | Ht 65.0 in | Wt 226.8 lb

## 2020-02-11 DIAGNOSIS — I639 Cerebral infarction, unspecified: Secondary | ICD-10-CM | POA: Diagnosis not present

## 2020-02-11 DIAGNOSIS — R079 Chest pain, unspecified: Secondary | ICD-10-CM | POA: Diagnosis not present

## 2020-02-11 DIAGNOSIS — R600 Localized edema: Secondary | ICD-10-CM

## 2020-02-11 DIAGNOSIS — I2510 Atherosclerotic heart disease of native coronary artery without angina pectoris: Secondary | ICD-10-CM

## 2020-02-11 DIAGNOSIS — R0602 Shortness of breath: Secondary | ICD-10-CM

## 2020-02-11 DIAGNOSIS — I10 Essential (primary) hypertension: Secondary | ICD-10-CM

## 2020-02-11 DIAGNOSIS — R9439 Abnormal result of other cardiovascular function study: Secondary | ICD-10-CM | POA: Diagnosis not present

## 2020-02-11 DIAGNOSIS — F17200 Nicotine dependence, unspecified, uncomplicated: Secondary | ICD-10-CM

## 2020-02-11 DIAGNOSIS — IMO0001 Reserved for inherently not codable concepts without codable children: Secondary | ICD-10-CM

## 2020-02-11 MED ORDER — NITROGLYCERIN 0.4 MG SL SUBL: 0.4000 mg | SUBLINGUAL_TABLET | SUBLINGUAL | 3 refills | Status: DC | PRN

## 2020-02-11 MED ORDER — ISOSORBIDE MONONITRATE ER 30 MG PO TB24: 30.0000 mg | ORAL_TABLET | Freq: Every day | ORAL | 6 refills | Status: DC

## 2020-02-11 NOTE — Patient Instructions (Signed)
Medication Instructions:   Begin Imdur 30mg  daily.   Begin Nitroglycerin as needed for severe chest pain only.  Continue all other medications.    Labwork: none  Testing/Procedures: none  Follow-Up: 3 months   Any Other Special Instructions Will Be Listed Below (If Applicable).  If you need a refill on your cardiac medications before your next appointment, please call your pharmacy.

## 2020-02-12 ENCOUNTER — Ambulatory Visit: Payer: BLUE CROSS/BLUE SHIELD

## 2020-02-12 ENCOUNTER — Ambulatory Visit (INDEPENDENT_AMBULATORY_CARE_PROVIDER_SITE_OTHER): Payer: BLUE CROSS/BLUE SHIELD

## 2020-02-12 DIAGNOSIS — I739 Peripheral vascular disease, unspecified: Secondary | ICD-10-CM | POA: Diagnosis not present

## 2020-02-18 ENCOUNTER — Telehealth: Payer: Self-pay | Admitting: *Deleted

## 2020-02-18 NOTE — Telephone Encounter (Signed)
Laurine Blazer, LPN  67/25/5001 6:42 PM EDT Back to Top    Notified, copy to pcp.    Laurine Blazer, LPN  90/37/9558 3:16 PM EDT     Left message to return call.   Verta Ellen., NP  02/13/2020 3:38 PM EDT     Please call the patient and let her know there was no evidence of significant arterial disease in either of her legs to explain the leg pain on her lower arterial study /ABI Thanks

## 2020-02-29 ENCOUNTER — Ambulatory Visit: Payer: BLUE CROSS/BLUE SHIELD | Admitting: Family Medicine

## 2020-03-11 ENCOUNTER — Other Ambulatory Visit: Payer: Self-pay | Admitting: Family Medicine

## 2020-03-11 MED ORDER — ATORVASTATIN CALCIUM 40 MG PO TABS
40.0000 mg | ORAL_TABLET | Freq: Every day | ORAL | 1 refills | Status: DC
Start: 2020-03-11 — End: 2020-09-24

## 2020-03-11 MED ORDER — FENOFIBRATE 160 MG PO TABS
160.0000 mg | ORAL_TABLET | Freq: Every day | ORAL | 1 refills | Status: DC
Start: 2020-03-11 — End: 2020-10-01

## 2020-03-11 NOTE — Telephone Encounter (Signed)
   1. Which medications need to be refilled? (please list name of each medication and dose if known)  Lipitor & Fenofibrate   2. Which pharmacy/location (including street and city if local pharmacy) is medication to be sent to?  [facility]   3. Do they need a 30 day or 90 day supply?   Patient states that pharmacy has faxed several times.

## 2020-05-13 ENCOUNTER — Other Ambulatory Visit: Payer: Self-pay | Admitting: Cardiology

## 2020-05-13 ENCOUNTER — Ambulatory Visit: Payer: BLUE CROSS/BLUE SHIELD | Admitting: Cardiology

## 2020-05-15 ENCOUNTER — Telehealth: Payer: Self-pay | Admitting: Cardiology

## 2020-05-15 NOTE — Telephone Encounter (Signed)
  Patient Consent for Virtual Visit         Lauren [name] has provided verbal consent on 05/15/2020 for a virtual visit (video or telephone).   CONSENT FOR VIRTUAL VISIT FOR:  Lauren [name]  By participating in this virtual visit I agree to the following:  I hereby voluntarily request, consent and authorize [facility] and its employed or contracted physicians, physician assistants, nurse practitioners or other licensed health care professionals (the Practitioner), to provide me with telemedicine health care services (the "Services") as deemed necessary by the treating Practitioner. I acknowledge and consent to receive the Services by the Practitioner via telemedicine. I understand that the telemedicine visit will involve communicating with the Practitioner through live audiovisual communication technology and the disclosure of certain medical information by electronic transmission. I acknowledge that I have been given the opportunity to request an in-person assessment or other available alternative prior to the telemedicine visit and am voluntarily participating in the telemedicine visit.  I understand that I have the right to withhold or withdraw my consent to the use of telemedicine in the course of my care at any time, without affecting my right to future care or treatment, and that the Practitioner or I may terminate the telemedicine visit at any time. I understand that I have the right to inspect all information obtained and/or recorded in the course of the telemedicine visit and may receive copies of available information for a reasonable fee.  I understand that some of the potential risks of receiving the Services via telemedicine include:  Marland Kitchen Delay or interruption in medical evaluation due to technological equipment failure or disruption; . Information transmitted may not be sufficient (e.g. poor resolution of images) to allow for appropriate medical decision making by the  Practitioner; and/or  . In rare instances, security protocols could fail, causing a breach of personal health information.  Furthermore, I acknowledge that it is my responsibility to provide information about my medical history, conditions and care that is complete and accurate to the best of my ability. I acknowledge that Practitioner's advice, recommendations, and/or decision may be based on factors not within their control, such as incomplete or inaccurate data provided by me or distortions of diagnostic images or specimens that may result from electronic transmissions. I understand that the practice of medicine is not an exact science and that Practitioner makes no warranties or guarantees regarding treatment outcomes. I acknowledge that a copy of this consent can be made available to me via my patient portal ([facility]), or I can request a printed copy by calling the office of [facility].    I understand that my insurance will be billed for this visit.   I have read or had this consent read to me. . I understand the contents of this consent, which adequately explains the benefits and risks of the Services being provided via telemedicine.  . I have been provided ample opportunity to ask questions regarding this consent and the Services and have had my questions answered to my satisfaction. . I give my informed consent for the services to be provided through the use of telemedicine in my medical care

## 2020-05-20 ENCOUNTER — Encounter: Payer: Self-pay | Admitting: Cardiology

## 2020-05-20 ENCOUNTER — Encounter: Payer: Self-pay | Admitting: *Deleted

## 2020-05-20 ENCOUNTER — Other Ambulatory Visit: Payer: Self-pay

## 2020-05-20 ENCOUNTER — Telehealth (INDEPENDENT_AMBULATORY_CARE_PROVIDER_SITE_OTHER): Payer: BLUE CROSS/BLUE SHIELD | Admitting: Cardiology

## 2020-05-20 VITALS — BP 130/80 | HR 76 | Ht 65.0 in | Wt 225.0 lb

## 2020-05-20 DIAGNOSIS — R0602 Shortness of breath: Secondary | ICD-10-CM | POA: Diagnosis not present

## 2020-05-20 DIAGNOSIS — E782 Mixed hyperlipidemia: Secondary | ICD-10-CM

## 2020-05-20 DIAGNOSIS — N184 Chronic kidney disease, stage 4 (severe): Secondary | ICD-10-CM | POA: Diagnosis not present

## 2020-05-20 DIAGNOSIS — R7989 Other specified abnormal findings of blood chemistry: Secondary | ICD-10-CM

## 2020-05-20 DIAGNOSIS — R778 Other specified abnormalities of plasma proteins: Secondary | ICD-10-CM

## 2020-05-20 NOTE — Patient Instructions (Signed)
Your physician recommends that you schedule a follow-up appointment in: [place]  Your physician recommends that you continue on your current medications as directed. Please refer to the Current Medication list given to you today.  Your physician has requested that you have an echocardiogram. Echocardiography is a painless test that uses sound waves to create images of your heart. It provides your doctor with information about the size and shape of your heart and how well your heart's chambers and valves are working. This procedure takes approximately one hour. There are no restrictions for this procedure.  Your physician recommends that you return for lab work - PLEASE FAST 6-8 HOUR PRIOR TO LAB WORK BMP/MG/TSH/LIPIDS/HGBA1C/CBC   Thank you for choosing [facility]!!

## 2020-05-20 NOTE — Addendum Note (Signed)
Addended by: Julian Hy T on: 05/20/2020 11:59 AM   Modules accepted: Orders

## 2020-05-20 NOTE — Progress Notes (Addendum)
Virtual Visit via Telephone Note   This visit type was conducted due to national recommendations for restrictions regarding the COVID-19 Pandemic (e.g. social distancing) in an effort to limit this patient's exposure and mitigate transmission in our community.  Due to her co-morbid illnesses, this patient is at least at moderate risk for complications without adequate follow up.  This format is felt to be most appropriate for this patient at this time.  The patient did not have access to video technology/had technical difficulties with video requiring transitioning to audio format only (telephone).  All issues noted in this document were discussed and addressed.  No physical exam could be performed with this format.  Please refer to the patient's chart for her  consent to telehealth for [facility].    Date:  05/20/2020   ID:  Lauren [name], DOB [birth year], MRN [id number] The patient was identified using 2 identifiers.  Patient Location: Home Provider Location: Office/Clinic  PCP:  Lemmie Evens, MD  Cardiologist:  Carlyle Dolly, MD  Electrophysiologist:  None   Evaluation Performed:  Follow-Up Visit  Chief Complaint:  Follow up  History of Present Illness:    Lauren [name] is a 40 y.o. female seen today for follow up of the following medical problems.   1. Elevated troponin - admitted 01/2020 with CVA, iin this setting elevated trop to 413. EKG without acute ischemic changes - echo with some apical hypokinesis - workup deferred to allow her time to recover from stroke, also thought was this could be a stress induced CM  02/2020 nuclear stress: large periapical defect consistent with scar, mild apical anterolateral ischemia. - of note she does report an MI in [facility] 5+years ago but reportedly told there were no blockages.  [facility] in [place]  - some chest pains with only high levels such as walking up flights of stairs, overall stable.      2. CKD - followed by pcp - has not reestablished with pcp   3. History of CVA - admitted 01/2020 with CVA, - on DAPT for prior CVA, not for a cardiac reason.    The patient does not have symptoms concerning for COVID-19 infection (fever, chills, cough, or new shortness of breath).    Past Medical History:  Diagnosis Date  . Diabetes mellitus without complication ([place])   . Myocardial infarction ([place])   . Renal disorder    stage 3 kidney disease   Past Surgical History:  Procedure Laterality Date  . Cardiac catherization    . CHOLECYSTECTOMY    . TUBAL LIGATION       Current Meds  Medication Sig  . aspirin EC 81 MG EC tablet Take 1 tablet (81 mg total) by mouth daily. Swallow whole.  Marland Kitchen atorvastatin (LIPITOR) 40 MG tablet Take 1 tablet (40 mg total) by mouth daily.  . clopidogrel (PLAVIX) 75 MG tablet TAKE 1 TABLET BY MOUTH ONCE DAILY.  . cyanocobalamin (,VITAMIN B-12,) 1000 MCG/ML injection Inject 1 mL daily for 6 days; then weekly for a month and then monthly after that.  . fenofibrate 160 MG tablet Take 1 tablet (160 mg total) by mouth daily.  . furosemide (LASIX) 40 MG tablet Take 40 mg by mouth daily as needed for fluid.   Marland Kitchen glucagon (GLUCAGON EMERGENCY) 1 MG injection Inject 1 mg into the vein once as needed.  . insulin glargine (LANTUS SOLOSTAR) 100 UNIT/ML Solostar Pen Inject 62 Units into the skin in the morning. (Patient  taking differently: Inject 58 Units into the skin in the morning.)  . insulin lispro (HUMALOG) 100 UNIT/ML injection Inject 5-10 Units into the skin 3 (three) times daily before meals.  . isosorbide mononitrate (IMDUR) 30 MG 24 hr tablet Take 1 tablet (30 mg total) by mouth daily.  Marland Kitchen levothyroxine (SYNTHROID) 175 MCG tablet Take 175 mcg by mouth daily before breakfast.   . metoprolol succinate (TOPROL-XL) 25 MG 24 hr tablet Take 0.5 tablets (12.5 mg total) by mouth daily.  . nitroGLYCERIN (NITROSTAT) 0.4 MG SL tablet Place 1 tablet (0.4 mg  total) under the tongue every 5 (five) minutes as needed for chest pain.  . pantoprazole (PROTONIX) 40 MG tablet Take 40 mg by mouth daily as needed (for acid reflux).   . potassium chloride SA (K-DUR,KLOR-CON) 20 MEQ tablet Take 20 mEq by mouth daily as needed (When taking Lasix (Furosemide)).      Allergies:   Wellbutrin [bupropion], Ciprofloxacin hcl, and Tape   Social History   Tobacco Use  . Smoking status: Former Smoker    Packs/day: 1.00    Types: Cigarettes  . Smokeless tobacco: Never Used  Vaping Use  . Vaping Use: Never used  Substance Use Topics  . Alcohol use: No    Alcohol/week: 0.0 standard drinks  . Drug use: No     Family Hx: The patient's family history includes CAD in her father and mother; CVA in her mother; Hyperlipidemia in her father and mother; Hypertension in her father and mother; Thyroid disease in her mother.  ROS:   Please see the history of present illness.     All other systems reviewed and are negative.   Prior CV studies:   The following studies were reviewed today:  12/2019 echo IMPRESSIONS    1. Left ventricular ejection fraction, by estimation, is 55 to 60%. The  left ventricle has normal function. The left ventricle demonstrates  regional wall motion abnormalities (see scoring diagram/findings for  description). There is mild left ventricular  hypertrophy. Left ventricular diastolic parameters were normal. There is  moderate hypokinesis of the left ventricular, apical. There is sluggish  apical flow without clear formed thrombus.  2. Right ventricular systolic function is normal. The right ventricular  size is normal. There is mildly elevated pulmonary artery systolic  pressure.  3. The mitral valve is normal in structure. Trivial mitral valve  regurgitation. No evidence of mitral stenosis.  4. The inferior vena cava is normal in size with greater than 50%  respiratory variability, suggesting right atrial pressure of 3 mmHg.   5. The aortic valve is tricuspid. Aortic valve regurgitation is not  visualized. No aortic stenosis is present.    02/2020 nuclear stress  No diagnostic ST segment changes to indicate ischemia.  Large, severe intensity, periapical defect extending into the anterolateral wall and inferior septum that is largely fixed and consistent with infarct scar. There is a small region of apical anterolateral reversibility indicating a mild ischemic territory.  This is a high risk study based on defect size although region is most consistent with scar and a mild region of peri-infarct ischemia.  Nuclear stress EF: 48%. Large area of periapical akinesis.  Labs/Other Tests and Data Reviewed:    EKG:  No ECG reviewed.  Recent Labs: 01/01/2020: Hemoglobin 11.6; Platelets 374 01/02/2020: Magnesium 2.0 02/01/2020: BUN 28; Creatinine, Ser 2.25; Potassium 4.4; Sodium 134   Recent Lipid Panel Lab Results  Component Value Date/Time   CHOL 193 01/01/2020 03:19 AM  TRIG 416 (H) 01/01/2020 03:19 AM   HDL 25 (L) 01/01/2020 03:19 AM   CHOLHDL 7.7 01/01/2020 03:19 AM   LDLCALC UNABLE TO CALCULATE IF TRIGLYCERIDE OVER 400 mg/dL 01/01/2020 03:19 AM   LDLDIRECT 84.9 01/01/2020 03:19 AM    Wt Readings from Last 3 Encounters:  05/20/20 225 lb (102.1 kg)  02/11/20 226 lb 12.8 oz (102.9 kg)  01/29/20 232 lb (105.2 kg)     Risk Assessment/Calculations:      Objective:    Vital Signs:  BP 130/80   Pulse 76   Ht '5\' 5"'$  (1.651 m)   Wt 225 lb (102.1 kg)   BMI 37.44 kg/m    Normal affect. Normal speech pattern and tone. Comfortable, no apparent distress. No audible signs of sob or wheezing.   ASSESSMENT & PLAN:    1. Elevated troponin - prior admit with elevated troponin, apical hypokinesis by echo. Was unclear if CAD related or stress induced CM at that time as she presented with CVA - self reported history of MI in [facility] in [place], [place]. She reports was told cath showed no  blockages. Unclear if perhaps she had an apical infarct at that time and this is what we see by echo. Will request records - continue medical therapy.  - repeat echo to reevluate apical hypokinesis, further clarify if chronic vs possibly transient/stress induced as last echo was during her admission with CVA  2. Hyperlipidemia - continue statin, repeat labs  3. CKD III/IV - repeat labs, pending results likely will need another referal to nephrology        COVID-19 Education: The signs and symptoms of COVID-19 were discussed with the patient and how to seek care for testing (follow up with PCP or arrange E-visit).  The importance of social distancing was discussed today.  Time:   Today, I have spent 19 minutes with the patient with telehealth technology discussing the above problems.     Medication Adjustments/Labs and Tests Ordered: Current medicines are reviewed at length with the patient today.  Concerns regarding medicines are outlined above.   Tests Ordered: No orders of the defined types were placed in this encounter.   Medication Changes: No orders of the defined types were placed in this encounter.   Follow Up:  In Person in 3 month(s)  Signed, Carlyle Dolly, MD  05/20/2020 11:09 AM

## 2020-05-26 ENCOUNTER — Ambulatory Visit (HOSPITAL_COMMUNITY)
Admission: RE | Admit: 2020-05-26 | Discharge: 2020-05-26 | Disposition: A | Discharge status: Home / Self Care | Payer: BLUE CROSS/BLUE SHIELD | Source: Ambulatory Visit | Attending: Cardiology | Admitting: Cardiology

## 2020-05-26 ENCOUNTER — Other Ambulatory Visit: Payer: Self-pay

## 2020-05-26 ENCOUNTER — Telehealth: Payer: Self-pay | Admitting: Cardiology

## 2020-05-26 DIAGNOSIS — N184 Chronic kidney disease, stage 4 (severe): Secondary | ICD-10-CM

## 2020-05-26 DIAGNOSIS — I340 Nonrheumatic mitral (valve) insufficiency: Secondary | ICD-10-CM

## 2020-05-26 LAB — ECHOCARDIOGRAM LIMITED
AV Mean grad: 4.1 mmHg
AV Peak grad: 7.5 mmHg
Ao pk vel: 1.37 m/s
Area-P 1/2: 5.5 cm2
MV M vel: 4.56 m/s
MV Peak grad: 83.2 mmHg
S' Lateral: 3.27 cm

## 2020-05-26 MED ORDER — PERFLUTREN LIPID MICROSPHERE
1.0000 mL | INTRAVENOUS | Status: AC | PRN
  Administered 2020-05-26: 1 mL via INTRAVENOUS

## 2020-05-26 NOTE — Telephone Encounter (Signed)
Echo reviewed today, LVEF 35-40% with apical akinesis. I reviewed the echo which I had read in 12/2019 and compared images side by side. 12/2019 LVEF likely an overestimate and closer to 40%, difficult assessment given the base and mid ventricle move well and main dysfunction is focused in the apex.   Carlyle Dolly MD

## 2020-05-26 NOTE — Progress Notes (Signed)
*  PRELIMINARY RESULTS* Echocardiogram 2D Echocardiogram Limited with definity has been performed. BP at 1030 am 118/79.  Leavy Cella 05/26/2020, 10:23 AM

## 2020-06-10 ENCOUNTER — Telehealth: Payer: Self-pay | Admitting: *Deleted

## 2020-06-10 NOTE — Telephone Encounter (Signed)
-----   Message from Arnoldo Lenis, MD sent at 06/09/2020 11:27 AM EST ----- Echo shows some ongoing weakness of the pumping function of her heart. Will discuss further medication changes, can we see if she can see me in March as opposed to April   J Branch MD

## 2020-06-10 NOTE — Telephone Encounter (Signed)
Pt voiced understanding - no available March appt - scheduled 2/11 @ 9am (first available until mid April)

## 2020-06-13 ENCOUNTER — Encounter: Payer: Self-pay | Admitting: Cardiology

## 2020-06-13 ENCOUNTER — Other Ambulatory Visit (HOSPITAL_COMMUNITY)
Admission: RE | Admit: 2020-06-13 | Discharge: 2020-06-13 | Disposition: A | Discharge status: Home / Self Care | Payer: BLUE CROSS/BLUE SHIELD | Source: Ambulatory Visit | Attending: Cardiology | Admitting: Cardiology

## 2020-06-13 ENCOUNTER — Other Ambulatory Visit: Payer: Self-pay

## 2020-06-13 ENCOUNTER — Ambulatory Visit (INDEPENDENT_AMBULATORY_CARE_PROVIDER_SITE_OTHER): Payer: BLUE CROSS/BLUE SHIELD | Admitting: Cardiology

## 2020-06-13 VITALS — BP 122/72 | HR 90 | Ht 65.0 in | Wt 218.0 lb

## 2020-06-13 DIAGNOSIS — E782 Mixed hyperlipidemia: Secondary | ICD-10-CM | POA: Diagnosis not present

## 2020-06-13 DIAGNOSIS — I2510 Atherosclerotic heart disease of native coronary artery without angina pectoris: Secondary | ICD-10-CM | POA: Diagnosis present

## 2020-06-13 DIAGNOSIS — I5022 Chronic systolic (congestive) heart failure: Secondary | ICD-10-CM | POA: Diagnosis not present

## 2020-06-13 DIAGNOSIS — N183 Chronic kidney disease, stage 3 unspecified: Secondary | ICD-10-CM | POA: Diagnosis not present

## 2020-06-13 LAB — HEMOGLOBIN A1C
Hgb A1c MFr Bld: 9.5 % — ABNORMAL HIGH (ref 4.8–5.6)
Mean Plasma Glucose: 225.95 mg/dL

## 2020-06-13 LAB — BASIC METABOLIC PANEL
Anion gap: 6 (ref 5–15)
BUN: 28 mg/dL — ABNORMAL HIGH (ref 6–20)
CO2: 22 mmol/L (ref 22–32)
Calcium: 9 mg/dL (ref 8.9–10.3)
Chloride: 109 mmol/L (ref 98–111)
Creatinine, Ser: 2.21 mg/dL — ABNORMAL HIGH (ref 0.44–1.00)
GFR, Estimated: 29 mL/min — ABNORMAL LOW (ref >60)
Glucose, Bld: 100 mg/dL — ABNORMAL HIGH (ref 70–99)
Potassium: 4.3 mmol/L (ref 3.5–5.1)
Sodium: 137 mmol/L (ref 135–145)

## 2020-06-13 LAB — CBC
HCT: 30.4 % — ABNORMAL LOW (ref 36.0–46.0)
Hemoglobin: 9.2 g/dL — ABNORMAL LOW (ref 12.0–15.0)
MCH: 25.4 pg — ABNORMAL LOW (ref 26.0–34.0)
MCHC: 30.3 g/dL (ref 30.0–36.0)
MCV: 84 fL (ref 80.0–100.0)
Platelets: 569 10*3/uL — ABNORMAL HIGH (ref 150–400)
RBC: 3.62 MIL/uL — ABNORMAL LOW (ref 3.87–5.11)
RDW: 14.8 % (ref 11.5–15.5)
WBC: 12.5 10*3/uL — ABNORMAL HIGH (ref 4.0–10.5)
nRBC: 0 % (ref 0.0–0.2)

## 2020-06-13 LAB — LIPID PANEL
Cholesterol: 100 mg/dL (ref 0–200)
HDL: 23 mg/dL — ABNORMAL LOW (ref >40)
LDL Cholesterol: 55 mg/dL (ref 0–99)
Total CHOL/HDL Ratio: 4.3 RATIO
Triglycerides: 109 mg/dL (ref <150)
VLDL: 22 mg/dL (ref 0–40)

## 2020-06-13 LAB — MAGNESIUM: Magnesium: 1.8 mg/dL (ref 1.7–2.4)

## 2020-06-13 LAB — TSH: TSH: 0.268 u[IU]/mL — ABNORMAL LOW (ref 0.350–4.500)

## 2020-06-13 MED ORDER — METOPROLOL SUCCINATE ER 25 MG PO TB24
12.5000 mg | ORAL_TABLET | Freq: Two times a day (BID) | ORAL | 1 refills | Status: DC
Start: 2020-06-13 — End: 2021-01-20

## 2020-06-13 MED ORDER — METOPROLOL SUCCINATE ER 25 MG PO TB24
12.5000 mg | ORAL_TABLET | Freq: Two times a day (BID) | ORAL | 1 refills | Status: DC
Start: 2020-06-13 — End: 2020-06-13

## 2020-06-13 NOTE — Patient Instructions (Signed)
Your physician recommends that you schedule a follow-up appointment in: 3-4 WEEKS WITH EXTENDER  Your physician has recommended you make the following change in your medication:   INCREASE TOPROL XL 12.5 MG TWICE DAILY   Your physician recommends that you return for lab work - PLEASE FAST 6-8 [place] - CBC/BMP/HGBA1C/LIPIDS/TSH/MG  Thank you for choosing [facility]!!

## 2020-06-13 NOTE — Progress Notes (Signed)
Clinical Summary Lauren [name] is a 40 y.o.female seen today for follow up of the following medical problems.   1.CAD/ Elevated troponin - admitted 01/2020 with CVA, iin this setting elevated trop to 413. EKG without acute ischemic changes - echo with some apical hypokinesis - workup deferred to allow her time to recover from stroke, also thought was this could be a stress induced CM  02/2020 nuclear stress: large periapical defect consistent with scar, mild apical anterolateral ischemia. - of note she does report an MI in [facility] 5+years ago but reportedly told there were no blockages.  [facility] in [place]  Jan 2022 echo LVEF 35-40%, grade III dd, apex akinetic  Echo reviewed today, LVEF 35-40% with apical akinesis. I reviewed the echo which I had read in 12/2019 and compared images side by side. 12/2019 LVEF likely an overestimate and closer to 40%, difficult assessment given the base and mid ventricle move well and main dysfunction is focused in the apex.  - has not required lasix    03/2014 Records from [facility] - admitted with DKA, found to have elevated troponins - echo with normal LVEF 65-70% - during stress test had new LBBB, referred for cath - 03/2014 cath: LVEF >60%, mid LAD 30%, D1 mod to severe small vessel, distally pruned, distal 50-60% disease. LCX patent, OM2, mild disease with distal mod to severe, RCA diffuse moderate disease not ameanble to PCI    2. CKD - followed by pcp - reports seeing neprhology some time ago but not recently   3. History of CVA - admitted 01/2020 with CVA, - on DAPT for prior CVA, not for a cardiac reason.    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 Diabetes mellitus without complication ([place])   . Myocardial infarction ([place])   . Renal disorder    stage 3 kidney disease     Allergies  Allergen Reactions  . Wellbutrin [Bupropion] Hives  . Ciprofloxacin Hcl Hives  and Rash    Hives/rash at injection site   . Tape Rash     Current Outpatient Medications  Medication Sig Dispense Refill  . aspirin EC 81 MG EC tablet Take 1 tablet (81 mg total) by mouth daily. Swallow whole. 30 tablet 0  . atorvastatin (LIPITOR) 40 MG tablet Take 1 tablet (40 mg total) by mouth daily. 90 tablet 1  . clopidogrel (PLAVIX) 75 MG tablet TAKE 1 TABLET BY MOUTH ONCE DAILY. 30 tablet 6  . cyanocobalamin (,VITAMIN B-12,) 1000 MCG/ML injection Inject 1 mL daily for 6 days; then weekly for a month and then monthly after that. 13 mL 0  . fenofibrate 160 MG tablet Take 1 tablet (160 mg total) by mouth daily. 90 tablet 1  . furosemide (LASIX) 40 MG tablet Take 40 mg by mouth daily as needed for fluid.     Marland Kitchen glucagon (GLUCAGON EMERGENCY) 1 MG injection Inject 1 mg into the vein once as needed. 1 each 12  . insulin glargine (LANTUS SOLOSTAR) 100 UNIT/ML Solostar Pen Inject 62 Units into the skin in the morning. (Patient taking differently: Inject 58 Units into the skin in the morning.)    . insulin lispro (HUMALOG) 100 UNIT/ML injection Inject 5-10 Units into the skin 3 (three) times daily before meals.    . isosorbide mononitrate (IMDUR) 30 MG 24 hr tablet Take 1 tablet (30 mg total) by mouth daily. [place]  tablet 6  . levothyroxine (SYNTHROID) 175 MCG tablet Take 175 mcg by mouth daily before breakfast.   10  . metoprolol succinate (TOPROL-XL) 25 MG 24 hr tablet Take 0.5 tablets (12.5 mg total) by mouth daily.    . nitroGLYCERIN (NITROSTAT) 0.4 MG SL tablet Place 1 tablet (0.4 mg total) under the tongue every 5 (five) minutes as needed for chest pain. 25 tablet 3  . pantoprazole (PROTONIX) 40 MG tablet Take 40 mg by mouth daily as needed (for acid reflux).     . potassium chloride SA (K-DUR,KLOR-CON) 20 MEQ tablet Take 20 mEq by mouth daily as needed (When taking Lasix (Furosemide)).      No current facility-administered medications for this visit.     Past Surgical History:  Procedure  Laterality Date  . Cardiac catherization    . CHOLECYSTECTOMY    . TUBAL LIGATION       Allergies  Allergen Reactions  . Wellbutrin [Bupropion] Hives  . Ciprofloxacin Hcl Hives and Rash    Hives/rash at injection site   . Tape Rash      Family History  Problem Relation Age of Onset  . Thyroid disease Mother   . Hypertension Mother   . Hyperlipidemia Mother   . CAD Mother   . CVA Mother   . Hyperlipidemia Father   . Hypertension Father   . CAD Father      Social History Lauren [name] reports that she has quit smoking. Her smoking use included cigarettes. She smoked 1.00 pack per day. She has never used smokeless tobacco. Lauren [name] reports no history of alcohol use.   Review of Systems CONSTITUTIONAL: No weight loss, fever, chills, weakness or fatigue.  HEENT: Eyes: No visual loss, blurred vision, double vision or yellow sclerae.No hearing loss, sneezing, congestion, runny nose or sore throat.  SKIN: No rash or itching.  CARDIOVASCULAR: per hpi RESPIRATORY: No shortness of breath, cough or sputum.  GASTROINTESTINAL: No anorexia, nausea, vomiting or diarrhea. No abdominal pain or blood.  GENITOURINARY: No burning on urination, no polyuria NEUROLOGICAL: No headache, dizziness, syncope, paralysis, ataxia, numbness or tingling in the extremities. No change in bowel or bladder control.  MUSCULOSKELETAL: No muscle, back pain, joint pain or stiffness.  LYMPHATICS: No enlarged nodes. No history of splenectomy.  PSYCHIATRIC: No history of depression or anxiety.  ENDOCRINOLOGIC: No reports of sweating, cold or heat intolerance. No polyuria or polydipsia.  Marland Kitchen   Physical Examination Today's Vitals   06/13/20 0852  BP: 122/72  Pulse: 90  SpO2: 98%  Weight: 218 lb (98.9 kg)  Height: '5\' 5"'$  (1.651 m)   Body mass index is 36.28 kg/m.  Gen: resting comfortably, no acute distress HEENT: no scleral icterus, pupils equal round and reactive, no palptable cervical adenopathy,   CV: RRR, no m/r/gno jvd Resp: Clear to auscultation bilaterally GI: abdomen is soft, non-tender, non-distended, normal bowel sounds, no hepatosplenomegaly MSK: extremities are warm, no edema.  Skin: warm, no rash Neuro:  no focal deficits Psych: appropriate affect   Diagnostic Studies 12/2019 echo IMPRESSIONS    1. Left ventricular ejection fraction, by estimation, is 55 to 60%. The  left ventricle has normal function. The left ventricle demonstrates  regional wall motion abnormalities (see scoring diagram/findings for  description). There is mild left ventricular  hypertrophy. Left ventricular diastolic parameters were normal. There is  moderate hypokinesis of the left ventricular, apical. There is sluggish  apical flow without clear formed thrombus.  2. Right ventricular systolic function is  normal. The right ventricular  size is normal. There is mildly elevated pulmonary artery systolic  pressure.  3. The mitral valve is normal in structure. Trivial mitral valve  regurgitation. No evidence of mitral stenosis.  4. The inferior vena cava is normal in size with greater than 50%  respiratory variability, suggesting right atrial pressure of 3 mmHg.  5. The aortic valve is tricuspid. Aortic valve regurgitation is not  visualized. No aortic stenosis is present.    02/2020 nuclear stress  No diagnostic ST segment changes to indicate ischemia.  Large, severe intensity, periapical defect extending into the anterolateral wall and inferior septum that is largely fixed and consistent with infarct scar. There is a small region of apical anterolateral reversibility indicating a mild ischemic territory.  This is a high risk study based on defect size although region is most consistent with scar and a mild region of peri-infarct ischemia.  Nuclear stress EF: 48%. Large area of periapical akinesis.   Jan 2022 echo IMPRESSIONS    1. The mid to distal anteroseptal wall is  akinetic. The apex is akinetic  with some portions dyskinetic, there is no signs of apical thrombus with  use of echocontrast. The mid to distal inferoseptal wall is akinetic. Marland Kitchen  Left ventricular ejection fraction,  by estimation, is 35 to 40%. The left ventricle has moderately decreased  function. Left ventricular diastolic parameters are consistent with Grade  III diastolic dysfunction (restrictive). Elevated left atrial pressure.  2. Right ventricular systolic function is normal. The right ventricular  size is normal.  3. Left atrial size was mildly dilated.  4. The mitral valve is normal in structure. Mild mitral valve  regurgitation. No evidence of mitral stenosis.  5. The inferior vena cava is normal in size with greater than 50%  respiratory variability, suggesting right atrial pressure of 3 mmHg.  6. Limited echo to evaluate LV function and wall motion.     Assessment and Plan  1. CAD - distal/small vessel disease by 2015 cath in [facility] - with recent echo and nuclear findings would suggest she had an apical infarct - stabel exertion chest pressure. With her renal dysfunction would not pursue cath  - increase toprol to 12.'5mg'$  bid and monitor symptoms (reports side effects of dizziness on higher beta blocker dosing, titrate slowly)  2. Hyperlipidemia - she will continue statin -repeat labs  3. 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  - increase beta blocker slowly given prior side effects, increase toprol to 12.'5mg'$  bid - avoid ACE/ARB/ARNI/aldactone given renal function, likely add low dose hydral soon  3. CKD III/IV - repeat labs     Arnoldo Lenis, M.D

## 2020-06-18 ENCOUNTER — Observation Stay (HOSPITAL_COMMUNITY)
Admission: EM | Admit: 2020-06-18 | Discharge: 2020-06-19 | Disposition: A | Discharge status: Home / Self Care | Payer: BLUE CROSS/BLUE SHIELD | Attending: Family Medicine | Admitting: Family Medicine

## 2020-06-18 ENCOUNTER — Other Ambulatory Visit: Payer: Self-pay

## 2020-06-18 ENCOUNTER — Encounter (HOSPITAL_COMMUNITY): Payer: Self-pay

## 2020-06-18 ENCOUNTER — Emergency Department (HOSPITAL_COMMUNITY): Payer: BLUE CROSS/BLUE SHIELD

## 2020-06-18 DIAGNOSIS — R778 Other specified abnormalities of plasma proteins: Secondary | ICD-10-CM

## 2020-06-18 DIAGNOSIS — Z87891 Personal history of nicotine dependence: Secondary | ICD-10-CM | POA: Insufficient documentation

## 2020-06-18 DIAGNOSIS — E039 Hypothyroidism, unspecified: Secondary | ICD-10-CM | POA: Diagnosis not present

## 2020-06-18 DIAGNOSIS — N1832 Chronic kidney disease, stage 3b: Secondary | ICD-10-CM | POA: Diagnosis not present

## 2020-06-18 DIAGNOSIS — Z20822 Contact with and (suspected) exposure to covid-19: Secondary | ICD-10-CM | POA: Diagnosis not present

## 2020-06-18 DIAGNOSIS — E1022 Type 1 diabetes mellitus with diabetic chronic kidney disease: Secondary | ICD-10-CM

## 2020-06-18 DIAGNOSIS — I130 Hypertensive heart and chronic kidney disease with heart failure and stage 1 through stage 4 chronic kidney disease, or unspecified chronic kidney disease: Secondary | ICD-10-CM | POA: Insufficient documentation

## 2020-06-18 DIAGNOSIS — R079 Chest pain, unspecified: Secondary | ICD-10-CM | POA: Diagnosis present

## 2020-06-18 DIAGNOSIS — E1165 Type 2 diabetes mellitus with hyperglycemia: Secondary | ICD-10-CM

## 2020-06-18 DIAGNOSIS — Z7982 Long term (current) use of aspirin: Secondary | ICD-10-CM | POA: Insufficient documentation

## 2020-06-18 DIAGNOSIS — I639 Cerebral infarction, unspecified: Secondary | ICD-10-CM | POA: Diagnosis present

## 2020-06-18 DIAGNOSIS — Z794 Long term (current) use of insulin: Secondary | ICD-10-CM | POA: Insufficient documentation

## 2020-06-18 DIAGNOSIS — E66812 Obesity, class 2: Secondary | ICD-10-CM

## 2020-06-18 DIAGNOSIS — I2089 Other forms of angina pectoris: Secondary | ICD-10-CM

## 2020-06-18 DIAGNOSIS — I25110 Atherosclerotic heart disease of native coronary artery with unstable angina pectoris: Secondary | ICD-10-CM | POA: Diagnosis not present

## 2020-06-18 DIAGNOSIS — E669 Obesity, unspecified: Secondary | ICD-10-CM

## 2020-06-18 DIAGNOSIS — I2583 Coronary atherosclerosis due to lipid rich plaque: Secondary | ICD-10-CM | POA: Diagnosis not present

## 2020-06-18 DIAGNOSIS — Z79899 Other long term (current) drug therapy: Secondary | ICD-10-CM | POA: Insufficient documentation

## 2020-06-18 DIAGNOSIS — D649 Anemia, unspecified: Secondary | ICD-10-CM

## 2020-06-18 DIAGNOSIS — I2510 Atherosclerotic heart disease of native coronary artery without angina pectoris: Secondary | ICD-10-CM

## 2020-06-18 DIAGNOSIS — I10 Essential (primary) hypertension: Secondary | ICD-10-CM | POA: Diagnosis present

## 2020-06-18 DIAGNOSIS — I5020 Unspecified systolic (congestive) heart failure: Secondary | ICD-10-CM | POA: Insufficient documentation

## 2020-06-18 DIAGNOSIS — I200 Unstable angina: Secondary | ICD-10-CM

## 2020-06-18 DIAGNOSIS — E1059 Type 1 diabetes mellitus with other circulatory complications: Secondary | ICD-10-CM | POA: Diagnosis present

## 2020-06-18 HISTORY — DX: Heart failure, unspecified: I50.9

## 2020-06-18 HISTORY — DX: Atherosclerotic heart disease of native coronary artery without angina pectoris: I25.10

## 2020-06-18 LAB — CBC
HCT: 26.6 % — ABNORMAL LOW (ref 36.0–46.0)
Hemoglobin: 7.7 g/dL — ABNORMAL LOW (ref 12.0–15.0)
MCH: 24.5 pg — ABNORMAL LOW (ref 26.0–34.0)
MCHC: 28.9 g/dL — ABNORMAL LOW (ref 30.0–36.0)
MCV: 84.7 fL (ref 80.0–100.0)
Platelets: 517 10*3/uL — ABNORMAL HIGH (ref 150–400)
RBC: 3.14 MIL/uL — ABNORMAL LOW (ref 3.87–5.11)
RDW: 14.6 % (ref 11.5–15.5)
WBC: 8.5 10*3/uL (ref 4.0–10.5)
nRBC: 0 % (ref 0.0–0.2)

## 2020-06-18 LAB — TROPONIN I (HIGH SENSITIVITY)
Troponin I (High Sensitivity): 268 ng/L (ref <18)
Troponin I (High Sensitivity): 253 ng/L (ref <18)
Troponin I (High Sensitivity): 261 ng/L (ref <18)
Troponin I (High Sensitivity): 271 ng/L (ref <18)

## 2020-06-18 LAB — FERRITIN: Ferritin: 4 ng/mL — ABNORMAL LOW (ref 11–307)

## 2020-06-18 LAB — GLUCOSE, CAPILLARY: Glucose-Capillary: 110 mg/dL — ABNORMAL HIGH (ref 70–99)

## 2020-06-18 LAB — BASIC METABOLIC PANEL
Anion gap: 4 — ABNORMAL LOW (ref 5–15)
BUN: 26 mg/dL — ABNORMAL HIGH (ref 6–20)
CO2: 20 mmol/L — ABNORMAL LOW (ref 22–32)
Calcium: 8.1 mg/dL — ABNORMAL LOW (ref 8.9–10.3)
Chloride: 109 mmol/L (ref 98–111)
Creatinine, Ser: 2.18 mg/dL — ABNORMAL HIGH (ref 0.44–1.00)
GFR, Estimated: 29 mL/min — ABNORMAL LOW (ref >60)
Glucose, Bld: 323 mg/dL — ABNORMAL HIGH (ref 70–99)
Potassium: 4.2 mmol/L (ref 3.5–5.1)
Sodium: 133 mmol/L — ABNORMAL LOW (ref 135–145)

## 2020-06-18 LAB — PREPARE RBC (CROSSMATCH)

## 2020-06-18 LAB — RETICULOCYTES
Immature Retic Fract: 21.9 % — ABNORMAL HIGH (ref 2.3–15.9)
RBC.: 3.19 MIL/uL — ABNORMAL LOW (ref 3.87–5.11)
Retic Count, Absolute: 63.5 10*3/uL (ref 19.0–186.0)
Retic Ct Pct: 2 % (ref 0.4–3.1)

## 2020-06-18 LAB — RESP PANEL BY RT-PCR (FLU A&B, COVID) ARPGX2
Influenza A by PCR: NEGATIVE
Influenza B by PCR: NEGATIVE
SARS Coronavirus 2 by RT PCR: NEGATIVE

## 2020-06-18 LAB — IRON AND TIBC
Iron: 12 ug/dL — ABNORMAL LOW (ref 28–170)
Saturation Ratios: 3 % — ABNORMAL LOW (ref 10.4–31.8)
TIBC: 441 ug/dL (ref 250–450)
UIBC: 429 ug/dL

## 2020-06-18 LAB — POC OCCULT BLOOD, ED: Fecal Occult Bld: NEGATIVE

## 2020-06-18 LAB — FOLATE: Folate: 5.6 ng/mL — ABNORMAL LOW (ref >5.9)

## 2020-06-18 LAB — VITAMIN B12: Vitamin B-12: 1100 pg/mL — ABNORMAL HIGH (ref 180–914)

## 2020-06-18 IMAGING — DX DG CHEST 1V PORT
1 series · 1 of 1 positions shown · non-contrast
Comparison: Portable exam [22] hours compared to [DATE]

CLINICAL DATA: Central chest pain today beginning at [22] hours,
history of MI, stroke, diabetes mellitus, essential hypertension

EXAM:
PORTABLE CHEST 1 VIEW

[chest ap]
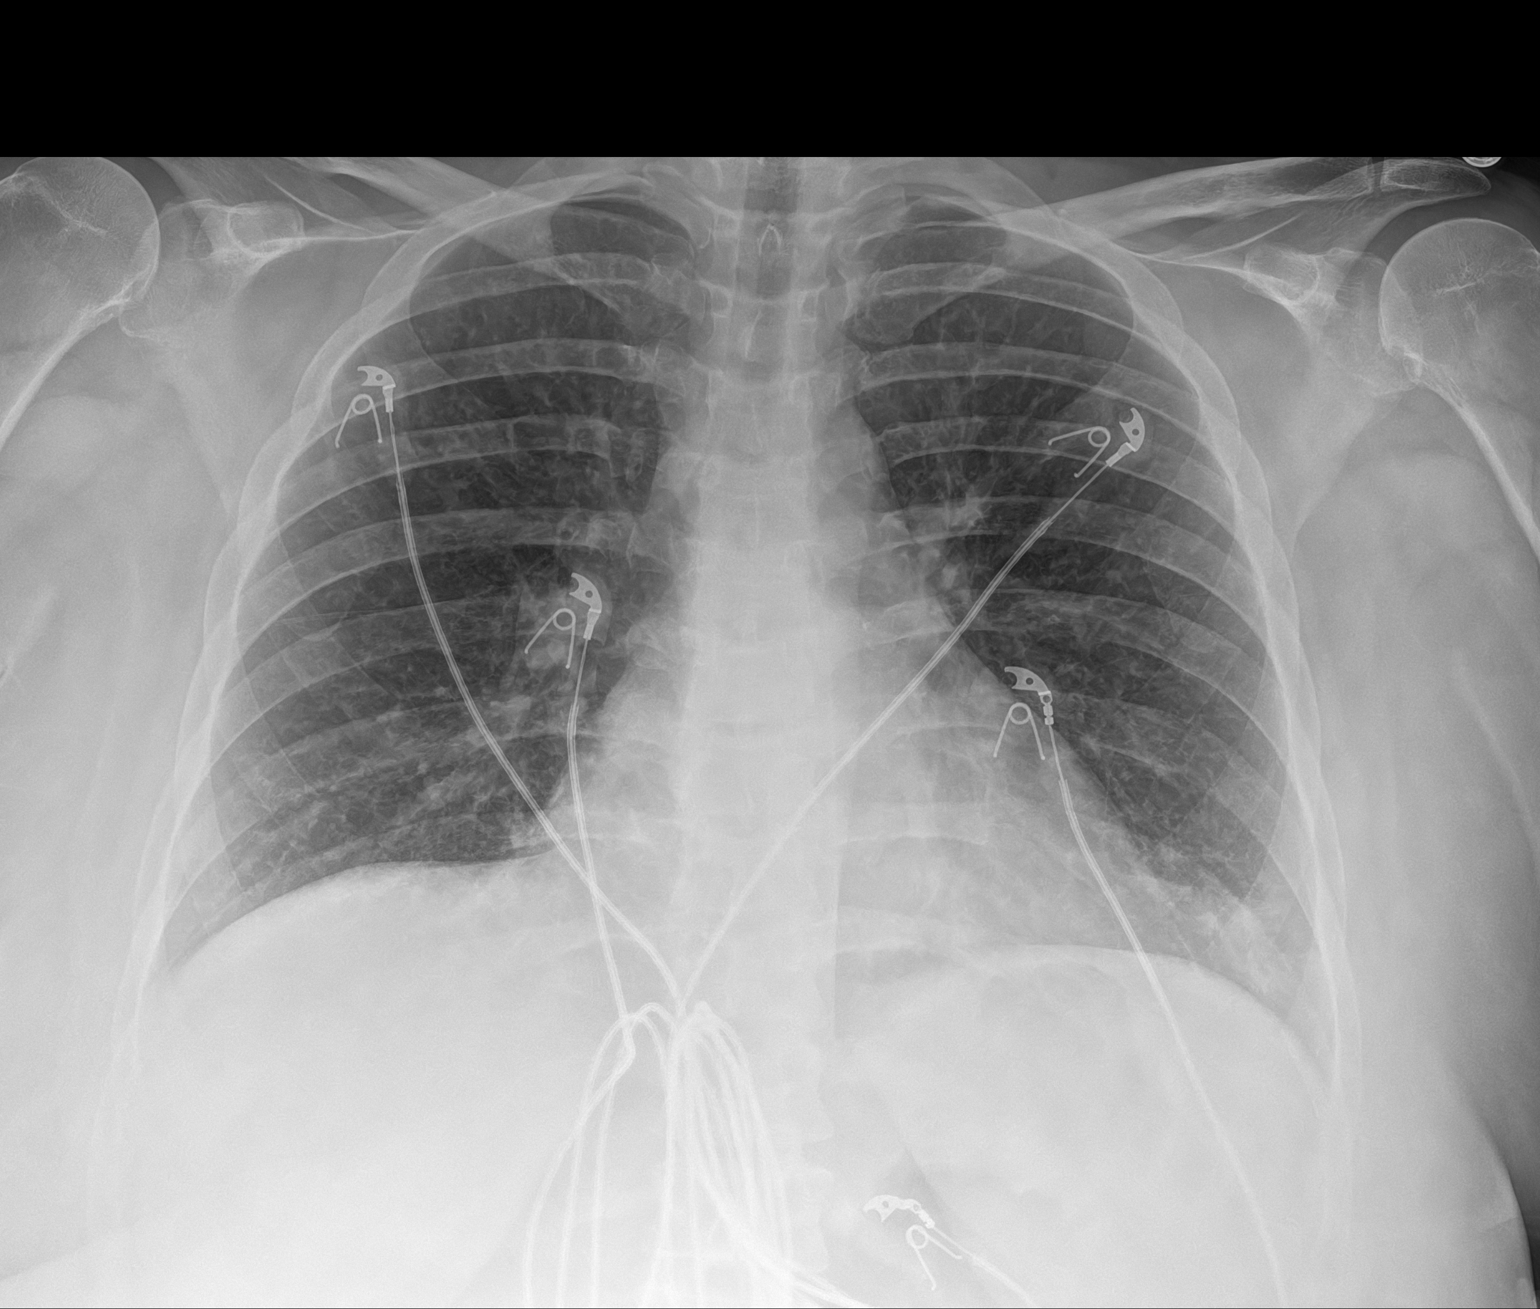

[1 of 1 positions shown; findings below may reference images not displayed]

FINDINGS: Upper normal heart size.

Mediastinal contours and pulmonary vascularity normal.

Lungs clear.

No infiltrate, pleural effusion, or pneumothorax.

Osseous structures unremarkable.
IMPRESSION: No acute abnormalities.

## 2020-06-18 MED ORDER — SODIUM CHLORIDE 0.9% IV SOLUTION: Freq: Once | INTRAVENOUS | Status: AC

## 2020-06-18 MED ORDER — MORPHINE SULFATE (PF) 2 MG/ML IV SOLN: 2.0000 mg | INTRAVENOUS | Status: DC | PRN

## 2020-06-18 MED ORDER — ATORVASTATIN CALCIUM 40 MG PO TABS
40.0000 mg | ORAL_TABLET | Freq: Every day | ORAL | Status: DC
  Administered 2020-06-19: 40 mg via ORAL
  Filled 2020-06-18: qty 1

## 2020-06-18 MED ORDER — LEVOTHYROXINE SODIUM 50 MCG PO TABS: 175.0000 ug | ORAL_TABLET | Freq: Every day | ORAL | Status: DC

## 2020-06-18 MED ORDER — ONDANSETRON HCL 4 MG PO TABS: 4.0000 mg | ORAL_TABLET | Freq: Four times a day (QID) | ORAL | Status: DC | PRN

## 2020-06-18 MED ORDER — ACETAMINOPHEN 325 MG PO TABS: 650.0000 mg | ORAL_TABLET | Freq: Four times a day (QID) | ORAL | Status: DC | PRN

## 2020-06-18 MED ORDER — NITROGLYCERIN 0.4 MG SL SUBL
0.4000 mg | SUBLINGUAL_TABLET | SUBLINGUAL | Status: DC | PRN
  Administered 2020-06-19: 0.4 mg via SUBLINGUAL
  Filled 2020-06-18: qty 1

## 2020-06-18 MED ORDER — FENOFIBRATE 160 MG PO TABS
160.0000 mg | ORAL_TABLET | Freq: Every day | ORAL | Status: DC
  Administered 2020-06-19: 160 mg via ORAL
  Filled 2020-06-18: qty 1

## 2020-06-18 MED ORDER — INSULIN GLARGINE 100 UNIT/ML SOLOSTAR PEN: 58.0000 [IU] | PEN_INJECTOR | Freq: Every morning | SUBCUTANEOUS | Status: DC

## 2020-06-18 MED ORDER — ONDANSETRON HCL 4 MG/2ML IJ SOLN: 4.0000 mg | Freq: Four times a day (QID) | INTRAMUSCULAR | Status: DC | PRN

## 2020-06-18 MED ORDER — METOPROLOL SUCCINATE ER 25 MG PO TB24
12.5000 mg | ORAL_TABLET | Freq: Every day | ORAL | Status: DC
  Filled 2020-06-18: qty 1

## 2020-06-18 MED ORDER — CLOPIDOGREL BISULFATE 75 MG PO TABS
75.0000 mg | ORAL_TABLET | Freq: Every day | ORAL | Status: DC
  Administered 2020-06-19: 75 mg via ORAL
  Filled 2020-06-18: qty 1

## 2020-06-18 MED ORDER — ASPIRIN EC 81 MG PO TBEC
81.0000 mg | DELAYED_RELEASE_TABLET | Freq: Every day | ORAL | Status: DC
Start: 2020-06-18 — End: 2020-06-19
  Administered 2020-06-19: 81 mg via ORAL
  Filled 2020-06-18: qty 1

## 2020-06-18 MED ORDER — INSULIN GLARGINE 100 UNIT/ML ~~LOC~~ SOLN
20.0000 [IU] | Freq: Every day | SUBCUTANEOUS | Status: DC
  Administered 2020-06-19: 20 [IU] via SUBCUTANEOUS
  Filled 2020-06-18 (×2): qty 0.2

## 2020-06-18 MED ORDER — ACETAMINOPHEN 650 MG RE SUPP: 650.0000 mg | Freq: Four times a day (QID) | RECTAL | Status: DC | PRN

## 2020-06-18 MED ORDER — PANTOPRAZOLE SODIUM 40 MG PO TBEC: 40.0000 mg | DELAYED_RELEASE_TABLET | Freq: Every day | ORAL | Status: DC | PRN

## 2020-06-18 MED ORDER — ISOSORBIDE MONONITRATE ER 30 MG PO TB24
30.0000 mg | ORAL_TABLET | Freq: Every day | ORAL | Status: DC
  Administered 2020-06-19: 30 mg via ORAL
  Filled 2020-06-18: qty 1

## 2020-06-18 MED ORDER — INSULIN ASPART 100 UNIT/ML ~~LOC~~ SOLN
0.0000 [IU] | Freq: Three times a day (TID) | SUBCUTANEOUS | Status: DC
  Administered 2020-06-19: 3 [IU] via SUBCUTANEOUS

## 2020-06-18 MED ORDER — LEVOTHYROXINE SODIUM 50 MCG PO TABS
150.0000 ug | ORAL_TABLET | Freq: Every day | ORAL | Status: DC
  Administered 2020-06-19: 150 ug via ORAL
  Filled 2020-06-18: qty 3

## 2020-06-18 MED ORDER — ENOXAPARIN SODIUM 40 MG/0.4ML ~~LOC~~ SOLN
40.0000 mg | SUBCUTANEOUS | Status: DC
  Filled 2020-06-18: qty 0.4

## 2020-06-18 MED ORDER — SODIUM CHLORIDE 0.9 % IV BOLUS
500.0000 mL | Freq: Once | INTRAVENOUS | Status: AC
  Administered 2020-06-18: 500 mL via INTRAVENOUS

## 2020-06-18 NOTE — ED Provider Notes (Signed)
[facility] EMERGENCY DEPARTMENT Provider Note   CSN: [id number] Arrival date & time: 06/18/20  1626     History Chief Complaint  Patient presents with  . Chest Pain    Lauren [name] is a 40 y.o. female.  40 year old female with history of DM, MI, CVA, CKD, CHF (systolic), brought in by EMS for chest pain. Patient states she was driving home today around 3:30PM when she felt mid sternal chest tightness and shortness of breath with nausea and diaphoresis. Patient went home and was trying to check her BP but her batteries were dead which made her feel more [facility] (attributes this to anxiety from the batteries being dead) so she took 4 baby asa and called 911. EMS arrived and gave patient nitro, pain resolved completely by time of arrival at the ER. Patient thought about leaving at that point but agreed to stay for evaluation given her complex history and concerning symptoms. Also reports blood sugar elevated, 330 at home, 530 with EMS, reports compliance with medications and is normally better controlled.         Past Medical History:  Diagnosis Date  . Diabetes mellitus without complication ([place])   . Myocardial infarction ([place])   . Renal disorder    stage 3 kidney disease    Patient Active Problem List   Diagnosis Date Noted  . Mixed hyperlipidemia   . Class 2 obesity due to excess calories without serious comorbidity with body mass index (BMI) of 39.0 to 39.9 in adult   . Acute renal failure superimposed on stage 3a chronic kidney disease ([place]) 01/01/2020  . Acute ischemic stroke ([place]) 01/01/2020  . Tobacco abuse 01/01/2020  . Chest pain 12/31/2019  . Cerebrovascular accident (CVA) ([place]) 12/31/2019  . Left sided numbness 12/31/2019  . Vitamin D deficiency 07/24/2015  . Essential hypertension 03/17/2015  . 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 Hypothyroidism 03/07/2015    Past Surgical History:  Procedure Laterality Date  . Cardiac  catherization    . CHOLECYSTECTOMY    . TUBAL LIGATION       OB History    Gravida  2   Para  2   Term  2   Preterm      AB      Living  2     SAB      IAB      Ectopic      Multiple      Live Births              Family History  Problem Relation Age of Onset  . Thyroid disease Mother   . Hypertension Mother   . Hyperlipidemia Mother   . CAD Mother   . CVA Mother   . Hyperlipidemia Father   . Hypertension Father   . CAD Father     Social History   Tobacco Use  . Smoking status: Former Smoker    Packs/day: 1.00    Types: Cigarettes  . Smokeless tobacco: Never Used  Vaping Use  . Vaping Use: Never used  Substance Use Topics  . Alcohol use: No    Alcohol/week: 0.0 standard drinks  . Drug use: No    Home Medications Prior to Admission medications   Medication Sig Start Date End Date Taking? Authorizing Provider  aspirin EC 81 MG EC tablet Take 1 tablet (81 mg total) by mouth daily. Swallow whole. 01/03/20   Barton Dubois, MD  atorvastatin (LIPITOR)  40 MG tablet Take 1 tablet (40 mg total) by mouth daily. 03/11/20   Verta Ellen., NP  clopidogrel (PLAVIX) 75 MG tablet TAKE 1 TABLET BY MOUTH ONCE DAILY. 05/13/20   Arnoldo Lenis, MD  cyanocobalamin (,VITAMIN B-12,) 1000 MCG/ML injection Inject 1 mL daily for 6 days; then weekly for a month and then monthly after that. 01/02/20   Barton Dubois, MD  fenofibrate 160 MG tablet Take 1 tablet (160 mg total) by mouth daily. 03/11/20   Verta Ellen., NP  furosemide (LASIX) 40 MG tablet Take 40 mg by mouth daily as needed for fluid.     [provider]  glucagon (GLUCAGON EMERGENCY) 1 MG injection Inject 1 mg into the vein once as needed. 04/10/15   Cassandria Anger, MD  insulin glargine (LANTUS SOLOSTAR) 100 UNIT/ML Solostar Pen Inject 62 Units into the skin in the morning. Patient taking differently: Inject 58 Units into the skin in the morning. 01/02/20   Barton Dubois, MD  insulin  lispro (HUMALOG) 100 UNIT/ML injection Inject 5-10 Units into the skin 3 (three) times daily before meals.    [provider]  isosorbide mononitrate (IMDUR) 30 MG 24 hr tablet Take 1 tablet (30 mg total) by mouth daily. 02/11/20 05/11/20  Verta Ellen., NP  levothyroxine (SYNTHROID) 175 MCG tablet Take 175 mcg by mouth daily before breakfast.  08/12/16   [provider]  metoprolol succinate (TOPROL-XL) 25 MG 24 hr tablet Take 0.5 tablets (12.5 mg total) by mouth in the morning and at bedtime. 06/13/20   Arnoldo Lenis, MD  nitroGLYCERIN (NITROSTAT) 0.4 MG SL tablet Place 1 tablet (0.4 mg total) under the tongue every 5 (five) minutes as needed for chest pain. 02/11/20 05/11/20  Verta Ellen., NP  pantoprazole (PROTONIX) 40 MG tablet Take 40 mg by mouth daily as needed (for acid reflux).     [provider]  potassium chloride SA (K-DUR,KLOR-CON) 20 MEQ tablet Take 20 mEq by mouth daily as needed (When taking Lasix (Furosemide)).     [provider]    Allergies    Wellbutrin [bupropion], Ciprofloxacin hcl, and Tape  Review of Systems   Review of Systems  Constitutional: Positive for diaphoresis. Negative for fever.  Respiratory: Positive for shortness of breath.   Cardiovascular: Positive for chest pain. Negative for palpitations and leg swelling.  Gastrointestinal: Positive for nausea. Negative for abdominal pain and vomiting.  Musculoskeletal: Negative for arthralgias and myalgias.  Skin: Negative for rash and wound.  Allergic/Immunologic: Positive for immunocompromised state.  Neurological: Negative for weakness.  Psychiatric/Behavioral: Negative for confusion.  All other systems reviewed and are negative.   Physical Exam Updated Vital Signs BP (!) 140/96   Pulse (!) 103   Temp 98.7 F (37.1 C) (Oral)   Resp 20   Ht '5\' 5"'$  (1.651 m)   Wt 98 kg   SpO2 99%   BMI 35.94 kg/m   Physical Exam Vitals and nursing note reviewed.   Constitutional:      General: She is not in acute distress.    Appearance: She is well-developed and well-nourished. She is not diaphoretic.  HENT:     Head: Normocephalic and atraumatic.  Cardiovascular:     Rate and Rhythm: Normal rate and regular rhythm.     Heart sounds: Normal heart sounds.  Pulmonary:     Effort: Pulmonary effort is normal.     Breath sounds: Normal breath sounds.  Chest:  Chest wall: No tenderness.  Abdominal:     Palpations: Abdomen is soft.     Tenderness: There is no abdominal tenderness.  Musculoskeletal:     Cervical back: Neck supple.     Right lower leg: No edema.     Left lower leg: No edema.  Skin:    General: Skin is warm and dry.     Findings: No erythema or rash.  Neurological:     Mental Status: She is alert and oriented to person, place, and time.  Psychiatric:        Mood and Affect: Mood and affect normal.        Behavior: Behavior normal.     ED Results / Procedures / Treatments   Labs (all labs ordered are listed, but only abnormal results are displayed) Labs Reviewed  BASIC METABOLIC PANEL - Abnormal; Notable for the following components:      Result Value   Sodium 133 (*)    CO2 20 (*)    Glucose, Bld 323 (*)    BUN 26 (*)    Creatinine, Ser 2.18 (*)    Calcium 8.1 (*)    GFR, Estimated 29 (*)    Anion gap 4 (*)    All other components within normal limits  CBC - Abnormal; Notable for the following components:   RBC 3.14 (*)    Hemoglobin 7.7 (*)    HCT 26.6 (*)    MCH 24.5 (*)    MCHC 28.9 (*)    Platelets 517 (*)    All other components within normal limits  TROPONIN I (HIGH SENSITIVITY) - Abnormal; Notable for the following components:   Troponin I (High Sensitivity) 268 (*)    All other components within normal limits  RESP PANEL BY RT-PCR (FLU A&B, COVID) ARPGX2  VITAMIN B12  FOLATE  IRON AND TIBC  FERRITIN  RETICULOCYTES  POC OCCULT BLOOD, ED  TROPONIN I (HIGH SENSITIVITY)    EKG EKG  Interpretation  Date/Time:  Wednesday June 18 2020 16:29:03 EST Ventricular Rate:  109 PR Interval:    QRS Duration: 88 QT Interval:  325 QTC Calculation: 438 R Axis:   99 Text Interpretation: Sinus tachycardia Non-specific ST-t changes No significant change since last tracing Confirmed by Lajean Saver [phone]) on 06/18/2020 5:10:21 PM   Radiology DG Chest Port 1 View  Result Date: 06/18/2020 CLINICAL DATA:  Central chest pain today beginning at 1515 hours, history of MI, stroke, diabetes mellitus, essential hypertension EXAM: PORTABLE CHEST 1 VIEW COMPARISON:  Portable exam 1654 hours compared to 12/31/2019 FINDINGS: Upper normal heart size. Mediastinal contours and pulmonary vascularity normal. Lungs clear. No infiltrate, pleural effusion, or pneumothorax. Osseous structures unremarkable. IMPRESSION: No acute abnormalities. Electronically Signed   By: Lavonia Dana M.D.   On: 06/18/2020 17:12    Procedures Procedures   Medications Ordered in ED Medications  sodium chloride 0.9 % bolus 500 mL (500 mLs Intravenous New Bag/Given 06/18/20 1848)    ED Course  I have reviewed the triage vital signs and the nursing notes.  Pertinent labs & imaging results that were available during my care of the patient were reviewed by me and considered in my medical decision making (see chart for details).  Clinical Course as of 06/18/20 Randol Kern  Wed Feb 16, 352  4850 40 year old female with hyperglycemia and chest pain as above. On exam, patient is well appearing, in no distress, is symptom free at this time. Mildly tachycardic at 103 at  time of exam.  Due to her history of MI, patient is agreeable to stay for work up.  [LM]  1807 Hgb 7.7, on menstrual cycle currently. Previously 9.2 5 days ago. States she has had a blood transfusion in the past, does not recall why. Denies blood in stools.  Hemoccult negative.  [LM]  1853 CMP with hyperglycemia with glucose of 323, given 500 bolus. Progressively  worsening kidney disease.   Troponin elevated at 268, EKG unchanged from prior. Discussed with Dr. Ashok Cordia, ER attending, plan is for hospitalist to admit.  Discussed with Dr. Cathlean Sauer with [facility] who will consult for admission.  [LM]  [id number] Discussed results and plan of care with patient who verbalizes understanding.  [LM]    Clinical Course User Index [LM] Roque Lias   MDM Rules/Calculators/A&P                          Final Clinical Impression(s) / ED Diagnoses Final diagnoses:  Chest pain, unspecified type  Anemia, unspecified type  Elevated troponin  Hyperglycemia due to diabetes mellitus [place])    Rx / DC Orders ED Discharge Orders    None       Tacy Learn, PA-C 06/18/20 Randol Kern    Lajean Saver, MD 06/19/20 1511

## 2020-06-18 NOTE — ED Notes (Signed)
Date and time results received: 06/18/20 1830  (use smartphrase ".now" to insert current time)  Test: Troponin Critical Value: 268 Name of Provider Notified: Jasper Loser PA Orders Received? Or Actions Taken?:NA

## 2020-06-18 NOTE — ED Triage Notes (Signed)
Pt presents to ED with complaints of mid chest pain started at 1515. Pt took 1 nitro and 4 baby asa PTA. CBG 530. Pain is now better.

## 2020-06-18 NOTE — ED Notes (Signed)
Patient denies any chest pain and is resting comfortably.  

## 2020-06-18 NOTE — H&P (Addendum)
History and Physical    Monzerat Michaux [id number] DOB: 1981-03-30 DOA: 06/18/2020  PCP: Lemmie Evens, MD   Patient coming from: Home   Chief Complaint: Chest pain.   HPI: Lauren [name] is a 40 y.o. female with medical history significant of  Type 1 diabetes mellitus, chronic kidney disease stage III, obesity type II, and history of CVA who presents with chest pain.   Patient reports exertional chest pain since 08/21, precordial in location usually relieved by rest.  She has nitroglycerin at home.  Her chest pain is triggered by exertion, she cannot walk beyond half a block or climb more than 1 flight of steps before she experiences chest pain. Today she had recurrence of her precordial chest pain, that occurred while driving, it was pressure in nature, 8/10 in intensity, no radiation, associated with diaphoresis and dizziness.  It lasted about 60 minutes before she called EMS.  She received nitroglycerin by EMS personnel with improvement of her chest pain.  At the time she reached the ED she was chest pain-free  Patient follows up with Dr. Harl Bowie from cardiology, 02/2020 nuclear stress test with large periapical defect consistent with scar, mild apical anterolateral ischemia.  Echocardiography 05/2020, ejection fraction 35 to 40% left ventricle, apex akinetic. 03/22/2014 cardiac catheterization with mid LAD 30%, left circumflex patent.,  RCA diffuse moderate disease not candidate for PCI.  Plan was to continue medical therapy, considering her poor GFR further cardiac catheterization was held.    ED Course: Patient chest pain-free, elevated troponins, unchanged EKG.  Referred for further evaluation.  Review of Systems:  1. General: No fevers, no chills, no weight gain or weight loss 2. ENT: No runny nose or sore throat, no hearing disturbances 3. Pulmonary: No dyspnea, cough, wheezing, or hemoptysis 4. Cardiovascular: positive for angina, claudication, lower  extremity edema, pnd or orthopnea 5. Gastrointestinal: No nausea or vomiting, no diarrhea or constipation 6. Hematology: No easy bruisability or frequent infections 7. Urology: No dysuria, hematuria or increased urinary frequency 8. Dermatology: No rashes. 9. Neurology: No seizures or paresthesias 10. Musculoskeletal: No joint pain or deformities  Past Medical History:  Diagnosis Date  . Diabetes mellitus without complication ([place])   . Myocardial infarction ([place])   . Renal disorder    stage 3 kidney disease    Past Surgical History:  Procedure Laterality Date  . Cardiac catherization    . CHOLECYSTECTOMY    . TUBAL LIGATION       reports that she has quit smoking. Her smoking use included cigarettes. She smoked 1.00 pack per day. She has never used smokeless tobacco. She reports that she does not drink alcohol and does not use drugs.  Allergies  Allergen Reactions  . Wellbutrin [Bupropion] Hives  . Ciprofloxacin Hcl Hives and Rash    Hives/rash at injection site   . Tape Rash    Family History  Problem Relation Age of Onset  . Thyroid disease Mother   . Hypertension Mother   . Hyperlipidemia Mother   . CAD Mother   . CVA Mother   . Hyperlipidemia Father   . Hypertension Father   . CAD Father      Prior to Admission medications   Medication Sig Start Date End Date Taking? Authorizing Provider  aspirin EC 81 MG EC tablet Take 1 tablet (81 mg total) by mouth daily. Swallow whole. 01/03/20   Barton Dubois, MD  atorvastatin (LIPITOR) 40 MG tablet Take 1 tablet (40 mg total) by mouth  daily. 03/11/20   Verta Ellen., NP  clopidogrel (PLAVIX) 75 MG tablet TAKE 1 TABLET BY MOUTH ONCE DAILY. 05/13/20   Arnoldo Lenis, MD  cyanocobalamin (,VITAMIN B-12,) 1000 MCG/ML injection Inject 1 mL daily for 6 days; then weekly for a month and then monthly after that. 01/02/20   Barton Dubois, MD  fenofibrate 160 MG tablet Take 1 tablet (160 mg total) by mouth daily. 03/11/20    Verta Ellen., NP  furosemide (LASIX) 40 MG tablet Take 40 mg by mouth daily as needed for fluid.     [provider]  glucagon (GLUCAGON EMERGENCY) 1 MG injection Inject 1 mg into the vein once as needed. 04/10/15   Cassandria Anger, MD  insulin glargine (LANTUS SOLOSTAR) 100 UNIT/ML Solostar Pen Inject 62 Units into the skin in the morning. Patient taking differently: Inject 58 Units into the skin in the morning. 01/02/20   Barton Dubois, MD  insulin lispro (HUMALOG) 100 UNIT/ML injection Inject 5-10 Units into the skin 3 (three) times daily before meals.    [provider]  isosorbide mononitrate (IMDUR) 30 MG 24 hr tablet Take 1 tablet (30 mg total) by mouth daily. 02/11/20 05/11/20  Verta Ellen., NP  levothyroxine (SYNTHROID) 175 MCG tablet Take 175 mcg by mouth daily before breakfast.  08/12/16   [provider]  metoprolol succinate (TOPROL-XL) 25 MG 24 hr tablet Take 0.5 tablets (12.5 mg total) by mouth in the morning and at bedtime. 06/13/20   Arnoldo Lenis, MD  nitroGLYCERIN (NITROSTAT) 0.4 MG SL tablet Place 1 tablet (0.4 mg total) under the tongue every 5 (five) minutes as needed for chest pain. 02/11/20 05/11/20  Verta Ellen., NP  pantoprazole (PROTONIX) 40 MG tablet Take 40 mg by mouth daily as needed (for acid reflux).     [provider]  potassium chloride SA (K-DUR,KLOR-CON) 20 MEQ tablet Take 20 mEq by mouth daily as needed (When taking Lasix (Furosemide)).     [provider]    Physical Exam: Vitals:   06/18/20 1700 06/18/20 1730 06/18/20 1800 06/18/20 1830  BP: 128/84 123/80 123/79 (!) 140/96  Pulse: (!) 103 99 95 (!) 103  Resp: '20 15 17 20  '$ Temp:      TempSrc:      SpO2: 99% 97% 97% 99%  Weight:      Height:        Vitals:   06/18/20 1700 06/18/20 1730 06/18/20 1800 06/18/20 1830  BP: 128/84 123/80 123/79 (!) 140/96  Pulse: (!) 103 99 95 (!) 103  Resp: '20 15 17 20  '$ Temp:      TempSrc:       SpO2: 99% 97% 97% 99%  Weight:      Height:       General: deconditioned  Neurology: Awake and alert, non focal Head and Neck. Head normocephalic. Neck supple with no adenopathy or thyromegaly.   E ENT: no pallor, no icterus, oral mucosa moist Cardiovascular: No JVD. S1-S2 present, rhythmic, no gallops, rubs, or murmurs. No lower extremity edema. Pulmonary:  Positive breath sounds bilaterally, adequate air movement, no wheezing, rhonchi or rales. Gastrointestinal. Abdomen protuberant Skin. No rashes Musculoskeletal: no joint deformities    Labs on Admission: I have personally reviewed following labs and imaging studies  CBC: Recent Labs  Lab 06/13/20 1042 06/18/20 1722  WBC 12.5* 8.5  HGB 9.2* 7.7*  HCT 30.4* 26.6*  MCV 84.0 84.7  PLT 569*  123XX123*   Basic Metabolic Panel: Recent Labs  Lab 06/13/20 1042 06/18/20 1722  NA 137 133*  K 4.3 4.2  CL 109 109  CO2 22 20*  GLUCOSE 100* 323*  BUN 28* 26*  CREATININE 2.21* 2.18*  CALCIUM 9.0 8.1*  MG 1.8  --    GFR: Estimated Creatinine Clearance: 40.9 mL/min (A) (by C-G formula based on SCr of 2.18 mg/dL (H)). Liver Function Tests: No results for input(s): AST, ALT, ALKPHOS, BILITOT, PROT, ALBUMIN in the last 168 hours. No results for input(s): LIPASE, AMYLASE in the last 168 hours. No results for input(s): AMMONIA in the last 168 hours. Coagulation Profile: No results for input(s): INR, PROTIME in the last 168 hours. Cardiac Enzymes: No results for input(s): CKTOTAL, CKMB, CKMBINDEX, TROPONINI in the last 168 hours. BNP (last 3 results) No results for input(s): PROBNP in the last 8760 hours. HbA1C: No results for input(s): HGBA1C in the last 72 hours. CBG: No results for input(s): GLUCAP in the last 168 hours. Lipid Profile: No results for input(s): CHOL, HDL, LDLCALC, TRIG, CHOLHDL, LDLDIRECT in the last 72 hours. Thyroid Function Tests: No results for input(s): TSH, T4TOTAL, FREET4, T3FREE, THYROIDAB in the last  72 hours. Anemia Panel: No results for input(s): VITAMINB12, FOLATE, FERRITIN, TIBC, IRON, RETICCTPCT in the last 72 hours. Urine analysis:    Component Value Date/Time   COLORURINE YELLOW 01/01/2020 0853   APPEARANCEUR HAZY (A) 01/01/2020 0853   LABSPEC 1.012 01/01/2020 0853   PHURINE 8.0 01/01/2020 0853   GLUCOSEU 50 (A) 01/01/2020 0853   HGBUR NEGATIVE 01/01/2020 0853   BILIRUBINUR NEGATIVE 01/01/2020 0853   KETONESUR NEGATIVE 01/01/2020 0853   PROTEINUR >=300 (A) 01/01/2020 0853   UROBILINOGEN 0.2 06/23/2014 1227   NITRITE NEGATIVE 01/01/2020 0853   LEUKOCYTESUR NEGATIVE 01/01/2020 0853    Radiological Exams on Admission: DG Chest Port 1 View  Result Date: 06/18/2020 CLINICAL DATA:  Central chest pain today beginning at 1515 hours, history of MI, stroke, diabetes mellitus, essential hypertension EXAM: PORTABLE CHEST 1 VIEW COMPARISON:  Portable exam 1654 hours compared to 12/31/2019 FINDINGS: Upper normal heart size. Mediastinal contours and pulmonary vascularity normal. Lungs clear. No infiltrate, pleural effusion, or pneumothorax. Osseous structures unremarkable. IMPRESSION: No acute abnormalities. Electronically Signed   By: Lavonia Dana M.D.   On: 06/18/2020 17:12    EKG: Independently reviewed.  109 bpm, normal axis, normal intervals, sinus rhythm, Q-wave lead III, aVF, V3-V6, no significant ST segment or T wave changes. 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  She  reports stable angina for the last 6 months, today her pain occur while sitting, driving her car.  It improved with sublingual nitroglycerin.  At the time of my examination chest pain-free. On her initial physical examination blood pressure 123/80, heart rate 99, respiratory rate 15, oxygen saturation 97% on room air, lungs are clear to auscultation bilaterally, heart S1-S2, present rhythmic, soft abdomen, no lower extremity edema.  Sodium 133, potassium 4.2, chloride 109, bicarb 20, glucose 323, BUN 26, creatinine  2.18, troponin I 268, white count 8.5, hemoglobin 7.7, hematocrit 26.6, platelets 517.  TSH 0.268 Chest radiograph no infiltrates.  Patient will be admitted to the hospital with a working diagnosis of worsening angina, unstable angina.  1.  Unstable angina.  Currently patient is chest pain-free, her troponin I is elevated but her EKG has no acute changes. Continue trending cardiac enzymes, telemetry monitoring, pain control with nitroglycerin and morphine. Imdur 30 mg daily  Continue dual antiplatelet therapy with aspirin and clopidogrel, statin therapy with atorvastatin and beta-blockade with metoprolol.   If patient has recurrence of chest pain or worsening troponin elevation will plan to start patient on intravenous heparin. Plan to transfuse 1 packed red blood cells, target hemoglobin above 8 in the setting of coronary artery disease.  Considering her worsening angina may need to revisit the idea of coronary angiography if persistent unstable angina after correction for hemoglobin  2.  Type 1 diabetes mellitus/dyslipidemia.  Continue glucose coverage and monitoring with insulin sliding scale, will reduce her basal insulin to prevent hypoglycemia. Carb restricted diet.  Continue fenofibrate and atorvastatin.  3.  Hypothyroidism.  Her TSH is low, will decrease dose of levothyroxine to 150 mcg daily, follow-up thyroid function as an outpatient.  4.  Anemia.  Check iron stores,  to rule out iron deficiency.  5.  Chronic kidney disease 3b.  Her kidney function seems to be stable, she is euvolemic, hold on furosemide.  Status is: Observation  The patient remains OBS appropriate and will d/c before 2 midnights.  Dispo: The patient is from: Home              Anticipated d/c is to: Home              Anticipated d/c date is: 1 day              Patient currently is not medically stable to d/c.   Difficult to place patient No   DVT prophylaxis: Enoxaparin   Code Status:   full  Family Communication:  No family at the bedside     Consults called:  Cardiology   Admission status:  Observation    Makynzie Dobesh Gerome Apley MD [facility]   06/18/2020, 6:51 PM

## 2020-06-19 ENCOUNTER — Encounter (HOSPITAL_COMMUNITY): Payer: Self-pay | Admitting: Internal Medicine

## 2020-06-19 DIAGNOSIS — E785 Hyperlipidemia, unspecified: Secondary | ICD-10-CM

## 2020-06-19 DIAGNOSIS — E108 Type 1 diabetes mellitus with unspecified complications: Secondary | ICD-10-CM | POA: Diagnosis not present

## 2020-06-19 DIAGNOSIS — D649 Anemia, unspecified: Secondary | ICD-10-CM

## 2020-06-19 DIAGNOSIS — E1059 Type 1 diabetes mellitus with other circulatory complications: Secondary | ICD-10-CM

## 2020-06-19 DIAGNOSIS — N1832 Chronic kidney disease, stage 3b: Secondary | ICD-10-CM | POA: Diagnosis not present

## 2020-06-19 DIAGNOSIS — I5020 Unspecified systolic (congestive) heart failure: Secondary | ICD-10-CM | POA: Diagnosis not present

## 2020-06-19 DIAGNOSIS — N183 Chronic kidney disease, stage 3 unspecified: Secondary | ICD-10-CM

## 2020-06-19 DIAGNOSIS — I200 Unstable angina: Secondary | ICD-10-CM

## 2020-06-19 DIAGNOSIS — E669 Obesity, unspecified: Secondary | ICD-10-CM | POA: Diagnosis not present

## 2020-06-19 DIAGNOSIS — I10 Essential (primary) hypertension: Secondary | ICD-10-CM

## 2020-06-19 DIAGNOSIS — E039 Hypothyroidism, unspecified: Secondary | ICD-10-CM

## 2020-06-19 DIAGNOSIS — Z8673 Personal history of transient ischemic attack (TIA), and cerebral infarction without residual deficits: Secondary | ICD-10-CM

## 2020-06-19 LAB — CBC
HCT: 25.2 % — ABNORMAL LOW (ref 36.0–46.0)
Hemoglobin: 7.4 g/dL — ABNORMAL LOW (ref 12.0–15.0)
MCH: 24.9 pg — ABNORMAL LOW (ref 26.0–34.0)
MCHC: 29.4 g/dL — ABNORMAL LOW (ref 30.0–36.0)
MCV: 84.8 fL (ref 80.0–100.0)
Platelets: 488 10*3/uL — ABNORMAL HIGH (ref 150–400)
RBC: 2.97 MIL/uL — ABNORMAL LOW (ref 3.87–5.11)
RDW: 14.6 % (ref 11.5–15.5)
WBC: 8.4 10*3/uL (ref 4.0–10.5)
nRBC: 0 % (ref 0.0–0.2)

## 2020-06-19 LAB — COMPREHENSIVE METABOLIC PANEL
ALT: 24 U/L (ref 0–44)
AST: 23 U/L (ref 15–41)
Albumin: 2.6 g/dL — ABNORMAL LOW (ref 3.5–5.0)
Alkaline Phosphatase: 41 U/L (ref 38–126)
Anion gap: 7 (ref 5–15)
BUN: 24 mg/dL — ABNORMAL HIGH (ref 6–20)
CO2: 20 mmol/L — ABNORMAL LOW (ref 22–32)
Calcium: 8.2 mg/dL — ABNORMAL LOW (ref 8.9–10.3)
Chloride: 111 mmol/L (ref 98–111)
Creatinine, Ser: 1.91 mg/dL — ABNORMAL HIGH (ref 0.44–1.00)
GFR, Estimated: 34 mL/min — ABNORMAL LOW (ref >60)
Glucose, Bld: 86 mg/dL (ref 70–99)
Potassium: 3.6 mmol/L (ref 3.5–5.1)
Sodium: 138 mmol/L (ref 135–145)
Total Bilirubin: 0.3 mg/dL (ref 0.3–1.2)
Total Protein: 5.1 g/dL — ABNORMAL LOW (ref 6.5–8.1)

## 2020-06-19 LAB — GLUCOSE, CAPILLARY
Glucose-Capillary: 38 mg/dL — CL (ref 70–99)
Glucose-Capillary: 78 mg/dL (ref 70–99)
Glucose-Capillary: 180 mg/dL — ABNORMAL HIGH (ref 70–99)
Glucose-Capillary: 114 mg/dL — ABNORMAL HIGH (ref 70–99)

## 2020-06-19 LAB — HEMOGLOBIN AND HEMATOCRIT, BLOOD
HCT: 29.3 % — ABNORMAL LOW (ref 36.0–46.0)
Hemoglobin: 9.1 g/dL — ABNORMAL LOW (ref 12.0–15.0)

## 2020-06-19 MED ORDER — FERROUS SULFATE 325 (65 FE) MG PO TABS: 325.0000 mg | ORAL_TABLET | Freq: Every day | ORAL | 1 refills | Status: DC

## 2020-06-19 MED ORDER — FERROUS SULFATE 325 (65 FE) MG PO TABS
325.0000 mg | ORAL_TABLET | Freq: Every day | ORAL | Status: DC
  Administered 2020-06-19: 325 mg via ORAL
  Filled 2020-06-19: qty 1

## 2020-06-19 MED ORDER — LANTUS SOLOSTAR 100 UNIT/ML ~~LOC~~ SOPN: 40.0000 [IU] | PEN_INJECTOR | Freq: Every morning | SUBCUTANEOUS | 11 refills | Status: DC

## 2020-06-19 MED ORDER — METOPROLOL SUCCINATE ER 25 MG PO TB24
12.5000 mg | ORAL_TABLET | Freq: Two times a day (BID) | ORAL | Status: DC
  Administered 2020-06-19: 12.5 mg via ORAL

## 2020-06-19 NOTE — Discharge Instructions (Signed)
[employer] medicine (25th ed., pp. [phone]). [place], [place]: Elsevier.">  Anemia  Anemia is a condition in which there is not enough red blood cells or hemoglobin in the blood. Hemoglobin is a substance in red blood cells that carries oxygen. When you do not have enough red blood cells or hemoglobin (are anemic), your body cannot get enough oxygen and your organs may not work properly. As a result, you may feel very tired or have other problems. What are the causes? Common causes of anemia include:  Excessive bleeding. Anemia can be caused by excessive bleeding inside or outside the body, including bleeding from the intestines or from heavy menstrual periods in females.  Poor nutrition.  Long-lasting (chronic) kidney, thyroid, and liver disease.  Bone marrow disorders, spleen problems, and blood disorders.  Cancer and treatments for cancer.  HIV (human immunodeficiency virus) and AIDS (acquired immunodeficiency syndrome).  Infections, medicines, and autoimmune disorders that destroy red blood cells. What are the signs or symptoms? Symptoms of this condition include:  Minor weakness.  Dizziness.  Headache, or difficulties concentrating and sleeping.  Heartbeats that feel irregular or faster than normal (palpitations).  Shortness of breath, especially with exercise.  Pale skin, lips, and nails, or cold hands and feet.  Indigestion and nausea. Symptoms may occur suddenly or develop slowly. If your anemia is mild, you may not have symptoms. How is this diagnosed? This condition is diagnosed based on blood tests, your medical history, and a physical exam. In some cases, a test may be needed in which cells are removed from the soft tissue inside of a bone and looked at under a microscope (bone marrow biopsy). Your health care provider may also check your stool (feces) for blood and may do additional testing to look for the cause of your bleeding. Other tests may  include:  Imaging tests, such as a CT scan or MRI.  A procedure to see inside your esophagus and stomach (endoscopy).  A procedure to see inside your colon and rectum (colonoscopy). How is this treated? Treatment for this condition depends on the cause. If you continue to lose a lot of blood, you may need to be treated at a hospital. Treatment may include:  Taking supplements of iron, vitamin Q68, or folic acid.  Taking a hormone medicine (erythropoietin) that can help to stimulate red blood cell growth.  Having a blood transfusion. This may be needed if you lose a lot of blood.  Making changes to your diet.  Having surgery to remove your spleen. Follow these instructions at home:  Take over-the-counter and prescription medicines only as told by your health care provider.  Take supplements only as told by your health care provider.  Follow any diet instructions that you were given by your health care provider.  Keep all follow-up visits as told by your health care provider. This is important. Contact a health care provider if:  You develop new bleeding anywhere in the body. Get help right away if:  You are very weak.  You are short of breath.  You have pain in your abdomen or chest.  You are dizzy or feel faint.  You have trouble concentrating.  You have bloody stools, black stools, or tarry stools.  You vomit repeatedly or you vomit up blood. These symptoms may represent a serious problem that is an emergency. Do not wait to see if the symptoms will go away. Get medical help right away. Call your local emergency services (911 in the [place] Do not  drive yourself to the hospital. Summary  Anemia is a condition in which you do not have enough red blood cells or enough of a substance in your red blood cells that carries oxygen (hemoglobin).  Symptoms may occur suddenly or develop slowly.  If your anemia is mild, you may not have symptoms.  This condition is  diagnosed with blood tests, a medical history, and a physical exam. Other tests may be needed.  Treatment for this condition depends on the cause of the anemia. This information is not intended to replace advice given to you by your health care provider. Make sure you discuss any questions you have with your health care provider. Document Revised: 03/27/2019 Document Reviewed: 03/27/2019 Elsevier Patient Education  2021 [place].   IMPORTANT INFORMATION: PAY CLOSE ATTENTION   PHYSICIAN DISCHARGE INSTRUCTIONS  Follow with Primary care provider  Lemmie Evens, MD  and other consultants as instructed by your Hospitalist Physician  [place] IF SYMPTOMS COME BACK, WORSEN OR NEW PROBLEM DEVELOPS   Please note: You were cared for by a hospitalist during your hospital stay. Every effort will be made to forward records to your primary care provider.  You can request that your primary care provider send for your hospital records if they have not received them.  Once you are discharged, your primary care physician will handle any further medical issues. Please note that NO REFILLS for any discharge medications will be authorized once you are discharged, as it is imperative that you return to your primary care physician (or establish a relationship with a primary care physician if you do not have one) for your post hospital discharge needs so that they can reassess your need for medications and monitor your lab values.  Please get a complete blood count and chemistry panel checked by your Primary MD at your next visit, and again as instructed by your Primary MD.  Get Medicines reviewed and adjusted: Please take all your medications with you for your next visit with your Primary MD  Laboratory/radiological data: Please request your Primary MD to go over all hospital tests and procedure/radiological results at the follow up, please ask your primary care provider  to get all Hospital records sent to his/her office.  In some cases, they will be blood work, cultures and biopsy results pending at the time of your discharge. Please request that your primary care provider follow up on these results.  If you are diabetic, please bring your blood sugar readings with you to your follow up appointment with primary care.    Please call and make your follow up appointments as soon as possible.    Also Note the following: If you experience worsening of your admission symptoms, develop shortness of breath, life threatening emergency, suicidal or homicidal thoughts you must seek medical attention immediately by calling 911 or calling your MD immediately  if symptoms less severe.  You must read complete instructions/literature along with all the possible adverse reactions/side effects for all the Medicines you take and that have been prescribed to you. Take any new Medicines after you have completely understood and accpet all the possible adverse reactions/side effects.   Do not drive when taking Pain medications or sleeping medications (Benzodiazepines)  Do not take more than prescribed Pain, Sleep and Anxiety Medications. It is not advisable to combine anxiety,sleep and pain medications without talking with your primary care practitioner  Special Instructions: If you have smoked or chewed Tobacco  in the last 2 yrs please stop smoking, stop any regular Alcohol  and or any Recreational drug use.  Wear Seat belts while driving.  Do not drive if taking any narcotic, mind altering or controlled substances or recreational drugs or alcohol.

## 2020-06-19 NOTE — Consult Note (Addendum)
Cardiology Consultation:   Patient ID: Lauren [name] MRN: [id number]; DOB: 09-23-1980  Admit date: 06/18/2020 Date of Consult: 06/19/2020  PCP:  Lemmie Evens, [place]  Cardiologist:  Carlyle Dolly, MD    Patient Profile:   Lauren [name] is a 40 y.o. female with past medical history of CAD (s/p cath in 03/2014 showing 30% mid-LAD, moderate to severe disease along small D1, patent LCx, moderate to severe distal OM2 stenosis and moderate diffuse diease along RCA not amenable to PCI, NST in 02/2020 showing large apical scar with only mild ischemia), chronic combined systolic and diastolic CHF (EF 0000000 in 12/2019, at 35-40% by echo in 05/2020), Type 1 DM, HTN, HLD, history of CVA (occurred in 12/2019) and Stage 3-4 CKD who is being seen today for the evaluation of unstable angina at the request of Dr. Wynetta Emery.  History of Present Illness:   [name] was recently examined by Dr. Harl Bowie on 06/13/2020 and reported still having chest discomfort and dyspnea on exertion. By review of his notes, medical management was recommended given his distal and small vessel disease by prior cath and was recommended to not pursue cath at that time given her renal dysfunction. Toprol-XL was increased to 12.'5mg'$  BID with consideration of adding Hydralazine in the future if BP tolerated as she was already on Imdur '30mg'$  daily. Was continued on ASA and Plavix from a Neurology perspective given her prior CVA.   She presented to [facility] ED on 06/18/2020 for evaluation of chest pain which had started earlier in the afternoon.Reported she had been in her normal state of health until that afternoon while driving home. She developed sternal chest pain and walked into her home and could not find her BP cuff or SL NTG which caused her to panic more. Reports associated dyspnea. Upon calling EMS, she was given SL NTG and her pain resolved.   Initial labs showed WBC 8.5, Hgb  7.7 (at 9.2 on 2/11 and previously 11.6 in 12/2019), platelets 517. Na+ 133, K+ 4.2 and creatinine 2.18 (previously 2.21 earlier this month). Occult blood negative. Ferritin 4. Initial HS Troponin 268 with repeat values of 253, 261, and 271. CXR with no acute findings. EKG showed sinus tachycardia, HR 109 with nonspecific ST abnormalities along the inferior leads.   She received 1 unit pRBC's and a repeat CBC has not been checked this AM. She does report one episode of chest pain while receiving her transfusion but no recurrent symptoms this AM.    Past Medical History:  Diagnosis Date   CAD (coronary artery disease)    CHF (congestive heart failure) ([place])    Diabetes mellitus without complication ([place])    Myocardial infarction ([place])    Renal disorder    stage 3 kidney disease    Past Surgical History:  Procedure Laterality Date   Cardiac catherization     CHOLECYSTECTOMY     TUBAL LIGATION       Home Medications:  Prior to Admission medications   Medication Sig Start Date End Date Taking? Authorizing Provider  aspirin EC 81 MG EC tablet Take 1 tablet (81 mg total) by mouth daily. Swallow whole. 01/03/20  Yes Barton Dubois, MD  atorvastatin (LIPITOR) 40 MG tablet Take 1 tablet (40 mg total) by mouth daily. 03/11/20  Yes Verta Ellen., NP  clopidogrel (PLAVIX) 75 MG tablet TAKE 1 TABLET BY MOUTH ONCE DAILY. 05/13/20  Yes Branch, Alphonse Guild, MD  fenofibrate  160 MG tablet Take 1 tablet (160 mg total) by mouth daily. 03/11/20  Yes Verta Ellen., NP  furosemide (LASIX) 40 MG tablet Take 40 mg by mouth daily as needed for fluid.    Yes [provider]  glucagon (GLUCAGON EMERGENCY) 1 MG injection Inject 1 mg into the vein once as needed. 04/10/15  Yes Nida, Marella Chimes, MD  insulin glargine (LANTUS SOLOSTAR) 100 UNIT/ML Solostar Pen Inject 62 Units into the skin in the morning. Patient taking differently: Inject 58 Units into the skin in the morning. 01/02/20  Yes Barton Dubois, MD  levothyroxine (SYNTHROID) 175 MCG tablet Take 175 mcg by mouth daily before breakfast.  08/12/16  Yes [provider]  metoprolol succinate (TOPROL-XL) 25 MG 24 hr tablet Take 0.5 tablets (12.5 mg total) by mouth in the morning and at bedtime. 06/13/20  Yes BranchAlphonse Guild, MD  pantoprazole (PROTONIX) 40 MG tablet Take 40 mg by mouth daily as needed (for acid reflux).    Yes [provider]  potassium chloride SA (K-DUR,KLOR-CON) 20 MEQ tablet Take 20 mEq by mouth daily as needed (When taking Lasix (Furosemide)).    Yes [provider]  cyanocobalamin (,VITAMIN B-12,) 1000 MCG/ML injection Inject 1 mL daily for 6 days; then weekly for a month and then monthly after that. Patient not taking: Reported on 06/19/2020 01/02/20   Barton Dubois, MD  insulin lispro (HUMALOG) 100 UNIT/ML injection Inject 5-10 Units into the skin 3 (three) times daily before meals. Patient not taking: Reported on 06/19/2020    [provider]  isosorbide mononitrate (IMDUR) 30 MG 24 hr tablet Take 1 tablet (30 mg total) by mouth daily. 02/11/20 05/11/20  Verta Ellen., NP  nitroGLYCERIN (NITROSTAT) 0.4 MG SL tablet Place 1 tablet (0.4 mg total) under the tongue every 5 (five) minutes as needed for chest pain. 02/11/20 05/11/20  Verta Ellen., NP    Inpatient Medications: Scheduled Meds:  aspirin EC  81 mg Oral Daily   atorvastatin  40 mg Oral Daily   clopidogrel  75 mg Oral Daily   enoxaparin (LOVENOX) injection  40 mg Subcutaneous Q24H   fenofibrate  160 mg Oral Daily   ferrous sulfate  325 mg Oral Q breakfast   insulin aspart  0-15 Units Subcutaneous TID WC   insulin glargine  20 Units Subcutaneous Daily   isosorbide mononitrate  30 mg Oral Daily   levothyroxine  150 mcg Oral Q0600   metoprolol succinate  12.5 mg Oral BID   Continuous Infusions:   PRN Meds: acetaminophen **OR** acetaminophen, morphine injection, nitroGLYCERIN, ondansetron **OR** ondansetron  (ZOFRAN) IV, pantoprazole  Allergies:    Allergies  Allergen Reactions   Wellbutrin [Bupropion] Hives   Ciprofloxacin Hcl Hives and Rash    Hives/rash at injection site    Tape Rash    Social History:   Social History   Socioeconomic History   Marital status: Single    Spouse name: Not on file   Number of children: Not on file   Years of education: Not on file   Highest education level: Not on file  Occupational History   Not on file  Tobacco Use   Smoking status: Former Smoker    Packs/day: 1.00    Types: Cigarettes   Smokeless tobacco: Never Used  Vaping Use   Vaping Use: Never used  Substance and Sexual Activity   Alcohol use: No    Alcohol/week: 0.0 standard drinks  Drug use: No   Sexual activity: Not on file  Other Topics Concern   Not on file  Social History Narrative   Not on file   Social Determinants of Health   Financial Resource Strain: Not on file  Food Insecurity: Not on file  Transportation Needs: Not on file  Physical Activity: Not on file  Stress: Not on file  Social Connections: Not on file  Intimate Partner Violence: Not on file    Family History:    Family History  Problem Relation Age of Onset   Thyroid disease Mother    Hypertension Mother    Hyperlipidemia Mother    CAD Mother    CVA Mother    Hyperlipidemia Father    Hypertension Father    CAD Father      ROS:  Please see the history of present illness.   All other ROS reviewed and negative.     Physical Exam/Data:   Vitals:   06/19/20 0654 06/19/20 0700 06/19/20 0756 06/19/20 0845  BP: 117/76 128/82 128/82 132/82  Pulse: 79 88 92 91  Resp: '16 16 16 16  '$ Temp: 98.2 F (36.8 C)   98.1 F (36.7 C)  TempSrc: Oral   Axillary  SpO2: 99%   100%  Weight:      Height:        Intake/Output Summary (Last 24 hours) at 06/19/2020 0942 Last data filed at 06/19/2020 0845 Gross per 24 hour  Intake 1335 ml  Output --  Net 1335 ml   Last 3 Weights 06/18/2020 06/18/2020  06/13/2020  Weight (lbs) 216 lb 9.6 oz 216 lb 218 lb  Weight (kg) 98.249 kg 97.977 kg 98.884 kg     Body mass index is 36.04 kg/m.  General:  Obese female appearing in no acute distress.  HEENT: normal Lymph: no adenopathy Neck: no JVD Endocrine:  No thryomegaly Vascular: No carotid bruits; FA pulses 2+ bilaterally without bruits  Cardiac:  normal S1, S2; RRR; no murmur. Lungs:  clear to auscultation bilaterally, no wheezing, rhonchi or rales  Abd: soft, nontender, no hepatomegaly  Ext: no edema Musculoskeletal:  No deformities, BUE and BLE strength normal and equal Skin: warm and dry  Neuro:  CNs 2-12 intact, no focal abnormalities noted Psych:  Normal affect   EKG:  The EKG was personally reviewed and demonstrates: Sinus tachycardia, HR 109 with nonspecific ST abnormalities along the inferior leads.   Telemetry:  Telemetry was personally reviewed and demonstrates:  NSR, HR in 80's to 90's.   Relevant CV Studies:  NST: 02/01/2020 No diagnostic ST segment changes to indicate ischemia. Large, severe intensity, periapical defect extending into the anterolateral wall and inferior septum that is largely fixed and consistent with infarct scar. There is a small region of apical anterolateral reversibility indicating a mild ischemic territory. This is a high risk study based on defect size although region is most consistent with scar and a mild region of peri-infarct ischemia. Nuclear stress EF: 48%. Large area of periapical akinesis.   Limited Echocardiogram: 05/26/2020 IMPRESSIONS     1. The mid to distal anteroseptal wall is akinetic. The apex is akinetic  with some portions dyskinetic, there is no signs of apical thrombus with  use of echocontrast. The mid to distal inferoseptal wall is akinetic. Marland Kitchen  Left ventricular ejection fraction,   by estimation, is 35 to 40%. The left ventricle has moderately decreased  function. Left ventricular diastolic parameters are consistent with  Grade  III diastolic dysfunction (  restrictive). Elevated left atrial pressure.   2. Right ventricular systolic function is normal. The right ventricular  size is normal.   3. Left atrial size was mildly dilated.   4. The mitral valve is normal in structure. Mild mitral valve  regurgitation. No evidence of mitral stenosis.   5. The inferior vena cava is normal in size with greater than 50%  respiratory variability, suggesting right atrial pressure of 3 mmHg.   6. Limited echo to evaluate LV function and wall motion.   Laboratory Data:  High Sensitivity Troponin:   Recent Labs  Lab 06/18/20 1722 06/18/20 1903 06/18/20 2134 06/18/20 2258  TROPONINIHS 268* 253* 261* 271*     Chemistry Recent Labs  Lab 06/13/20 1042 06/18/20 1722 06/19/20 0435  NA 137 133* 138  K 4.3 4.2 3.6  CL 109 109 111  CO2 22 20* 20*  GLUCOSE 100* 323* 86  BUN 28* 26* 24*  CREATININE 2.21* 2.18* 1.91*  CALCIUM 9.0 8.1* 8.2*  GFRNONAA 29* 29* 34*  ANIONGAP 6 4* 7    Recent Labs  Lab 06/19/20 0435  PROT 5.1*  ALBUMIN 2.6*  AST 23  ALT 24  ALKPHOS 41  BILITOT 0.3   Hematology Recent Labs  Lab 06/13/20 1042 06/18/20 1722 06/18/20 1903 06/19/20 0836  WBC 12.5* 8.5  --  8.4  RBC 3.62* 3.14* 3.19* 2.97*  HGB 9.2* 7.7*  --  7.4*  HCT 30.4* 26.6*  --  25.2*  MCV 84.0 84.7  --  84.8  MCH 25.4* 24.5*  --  24.9*  MCHC 30.3 28.9*  --  29.4*  RDW 14.8 14.6  --  14.6  PLT 569* 517*  --  488*   BNPNo results for input(s): BNP, PROBNP in the last 168 hours.  DDimer No results for input(s): DDIMER in the last 168 hours.   Radiology/Studies:  DG Chest Port 1 View  Result Date: 06/18/2020 CLINICAL DATA:  Central chest pain today beginning at 1515 hours, history of MI, stroke, diabetes mellitus, essential hypertension EXAM: PORTABLE CHEST 1 VIEW COMPARISON:  Portable exam 1654 hours compared to 12/31/2019 FINDINGS: Upper normal heart size. Mediastinal contours and pulmonary vascularity normal.  Lungs clear. No infiltrate, pleural effusion, or pneumothorax. Osseous structures unremarkable. IMPRESSION: No acute abnormalities. Electronically Signed   By: Lavonia Dana M.D.   On: 06/18/2020 17:12     Assessment and Plan:   1. Unstable Angina/CAD - She had a prior cath in 03/2014 showing small vessel and distal disease with NST in 02/2020 showing large apical scar with only mild ischemia. - Developed chest pain yesterday which resolved with SL NTG x1. Was found to be anemic on admission as well and received 1 unit pRBC's. Feels back to baseline at this time.  - Initial HS Troponin 268 with repeat values of 253, 261, and 271. Suspect secondary to demand ischemia in the setting of her CKD and anemia and less likely ACS (HS Troponin values were > 400 during her prior admission). EKG is without acute ST changes.  - At this time, would continue to focus on medical therapy. She is not a good candidate for a cardiac catheterization currently due to her anemia and CKD.  -  Continue current medical therapy with ASA '81mg'$  daily, Atorvastatin '40mg'$  daily, Plavix '75mg'$  daily (on this per Neuro), Toprol-XL 12.'5mg'$  BID and Imdur '30mg'$  daily.   2. Heart Failure with Reduced EF - Her EF was at 35-40% by echo in 05/2020. She denies any specific  orthopnea, PND or edema. Has been continued on PTA Toprol-XL 12.'5mg'$  BID and Imdur '30mg'$  daily. She reports episodes of hypotension at home so pending her trend following recent adjustment of her BB, could consider adding low-dose Hydralazine or transitioning to BiDil if covered by her insurance and BP allows. Not currently on an ACE-I/ARB/ARNI secondary to her advanced CKD.   3. Type 1 DM - Followed by her PCP. Hgb A1c was at 9.5 earlier this month. She would benefit from Endocrinology referral as an outpatient but reports her current co-pay for Speciality visits is unfeasible.   4. HTN - BP was well-controlled at 132/82 on most recent check. Continue PTA Toprol-XL 12.'5mg'$  BID  and Imdur '30mg'$  daily for now.   5. HLD - LDL was at 55 earlier this month. Remains on Atorvastatin '40mg'$  daily.   6. History of CVA - Felt to be ischemic in etiology by review of Neurology notes in 12/2019 but could not rule-out demyelinating disease at that time.  - She remains on ASA and Plavix from a Neurology perspective. Was encouraged to establish care as an outpatient.   7. Anemia - Hgb was at 7.7 on admission and was previously 9.2 on 2/11 and at 11.6 in 12/2019. She does report heavy periods and was found to have iron deficiency with Ferritin at 4 this AM. She did receive 1 unit pRBC's with repeat CBC pending. Further management per the admitting team.   8. Stage 3-4 CKD - Creatinine was at 2.18 on admission, previously 2.21 earlier this month. Improved to 1.91 today. Needs to establish with Nephrology as an outpatient as well.     Risk Assessment/Risk Scores:     TIMI Risk Score for Unstable Angina or Non-ST Elevation MI:   The patient's TIMI risk score is 5, which indicates a 26% risk of all cause mortality, new or recurrent myocardial infarction or need for urgent revascularization in the next 14 days.   For questions or updates, please contact [place] Please consult [email or web address] for contact info under    Signed, Erma Heritage, PA-C  06/19/2020 9:42 AM  Patient examined and chart reviewed. Discussed care with PA and primary service. The patient received a unit of pRBCs overnight. Had a brief episode of chest discomfort that resolved quickly. Cardiovascular examination today was normal. Telemetry did not reveal any unusual events in the past 24 hours. Repeat CBC is pending. Will emphasize optimization of medical management with dual antiplatelet meds and antianginal therapy (up titrate beta blocker as tolerated). Not a candidate for ACEi/ARBs/ARNI yet due to renal insufficiency. This can be readdressed in the future. Continue atorvastatin. Will hold off on a  cardiac catheterization on this admission unless the patient's symptoms escalate significantly. Her chest pain was likely due to demand from significant drop in H/H. Cath in 2015 had documented diffuse CAD.The patient admits to heavy menstrual periods in the recent past.    Melina Schools, MD, [facility], [facility]

## 2020-06-19 NOTE — Progress Notes (Signed)
Discharge instructions given and pt educated, pt expresses understanding. IV removed clean dry and intact. Pt and belongings taken to main entrance.

## 2020-06-19 NOTE — Discharge Summary (Signed)
Physician Discharge Summary  Sloka Hudson [id number] DOB: [birth year] DOA: 06/18/2020  PCP: Lemmie Evens, MD  Admit date: 06/18/2020 Discharge date: 06/19/2020  Admitted From:  Home  Disposition:  Home   Recommendations for Outpatient Follow-up:  1. Follow up with PCP in 1 weeks 2. Follow up with cardiology in 2 weeks 3. Outpatient referral to endocrinology/diabetologist recommended 4. Please titrate insulin doses as needed for better glycemic control 5. Strict hypoglycemia precautions   Discharge Condition: STABLE   CODE STATUS: FULL   DIET: carb modified   Brief Hospitalization Summary: Please see all hospital notes, images, labs for full details of the hospitalization. ADMISSION HPI: Xyla Analina Kindel is a 40 y.o. female with medical history significant of  Type 1 diabetes mellitus, chronic kidney disease stage III, obesity type II, and history of CVA who presents with chest pain.   Patient reports exertional chest pain since 08/21, precordial in location usually relieved by rest.  She has nitroglycerin at home.  Her chest pain is triggered by exertion, she cannot walk beyond half a block or climb more than 1 flight of steps before she experiences chest pain. Today she had recurrence of her precordial chest pain, that occurred while driving, it was pressure in nature, 8/10 in intensity, no radiation, associated with diaphoresis and dizziness.  It lasted about 60 minutes before she called EMS.  She received nitroglycerin by EMS personnel with improvement of her chest pain.  At the time she reached the ED she was chest pain-free  Patient follows up with Dr. Harl Bowie from cardiology, 02/2020 nuclear stress test with large periapical defect consistent with scar, mild apical anterolateral ischemia.  Echocardiography 05/2020, ejection fraction 35 to 40% left ventricle, apex akinetic.  03/22/2014 cardiac catheterization with mid LAD 30%, left circumflex patent.,  RCA diffuse  moderate disease not candidate for PCI.  Plan was to continue medical therapy, considering her poor GFR further cardiac catheterization was held.  ED Course: Patient chest pain-free, elevated troponins, unchanged EKG.  Referred for further evaluation.  Hospital Course  Pt was admitted with unstable angina chest pain symptoms and given her known CAD we asked for an inpatient cardiology consultation. She maintained stable HS troponin with no significant bump in troponin.  She was given PRBC  Transfusion with improvement in symptoms of chest pain.  Her Hg is improved to 9.1 after PRBC transfusion. This was thought by cardiology to be the cause of her symptoms and she is still going to be managed medically.  She feels much better now and stable to discharge home with close outpatient follow up. Her diabetes mellitus is poorly controlled. She is having hypoglycemia and her basal insulin dose is reduced 40 units daily.  She will need to test blood glucose frequently.  She needs outpatient endocrinology referral but reports that she can't afford the copays.    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[place])   Hypothyroidism   Essential hypertension   Cerebrovascular accident (CVA) ([place])   Class 2 obesity   CAD (coronary artery disease)   CKD stage G3b/A2, GFR 30-44 and albumin creatinine ratio 30-299 mg/g [facility])   Discharge Instructions:  Allergies as of 06/19/2020      Reactions   Wellbutrin [bupropion] Hives   Ciprofloxacin Hcl Hives, Rash   Hives/rash at injection site    Tape Rash      Medication List    STOP taking  these medications   cyanocobalamin 1000 MCG/ML injection Commonly known as: (VITAMIN B-12)   insulin lispro 100 UNIT/ML injection Commonly known as: HUMALOG     TAKE these medications   aspirin 81 MG EC tablet Take 1 tablet (81 mg total) by mouth daily. Swallow whole.    atorvastatin 40 MG tablet Commonly known as: LIPITOR Take 1 tablet (40 mg total) by mouth daily.   clopidogrel 75 MG tablet Commonly known as: PLAVIX TAKE 1 TABLET BY MOUTH ONCE DAILY.   fenofibrate 160 MG tablet Take 1 tablet (160 mg total) by mouth daily.   ferrous sulfate 325 (65 FE) MG tablet Take 1 tablet (325 mg total) by mouth daily with breakfast. Start taking on: June 20, 2020   furosemide 40 MG tablet Commonly known as: LASIX Take 40 mg by mouth daily as needed for fluid.   glucagon 1 MG injection Inject 1 mg into the vein once as needed.   isosorbide mononitrate 30 MG 24 hr tablet Commonly known as: IMDUR Take 1 tablet (30 mg total) by mouth daily.   Lantus SoloStar 100 UNIT/ML Solostar Pen Generic drug: insulin glargine Inject 40 Units into the skin in the morning. What changed: how much to take   levothyroxine 175 MCG tablet Commonly known as: SYNTHROID Take 175 mcg by mouth daily before breakfast.   metoprolol succinate 25 MG 24 hr tablet Commonly known as: TOPROL-XL Take 0.5 tablets (12.5 mg total) by mouth in the morning and at bedtime.   nitroGLYCERIN 0.4 MG SL tablet Commonly known as: NITROSTAT Place 1 tablet (0.4 mg total) under the tongue every 5 (five) minutes as needed for chest pain.   pantoprazole 40 MG tablet Commonly known as: PROTONIX Take 40 mg by mouth daily as needed (for acid reflux).   potassium chloride SA 20 MEQ tablet Commonly known as: KLOR-CON Take 20 mEq by mouth daily as needed (When taking Lasix (Furosemide)).       Follow-up Information    Lemmie Evens, MD. Schedule an appointment as soon as possible for a visit in 1 week(s).   Specialty: Family Medicine Contact information: [place] [address] [phone]        Arnoldo Lenis, MD. Schedule an appointment as soon as possible for a visit in 2 week(s).   Specialty: Cardiology Contact information: [place] [address] [phone]              Allergies  Allergen Reactions  . Wellbutrin [Bupropion] Hives  . Ciprofloxacin Hcl Hives and Rash    Hives/rash at injection site   . Tape Rash   Allergies as of 06/19/2020      Reactions   Wellbutrin [bupropion] Hives   Ciprofloxacin Hcl Hives, Rash   Hives/rash at injection site    Tape Rash      Medication List    STOP taking these medications   cyanocobalamin 1000 MCG/ML injection Commonly known as: (VITAMIN B-12)   insulin lispro 100 UNIT/ML injection Commonly known as: HUMALOG     TAKE these medications   aspirin 81 MG EC tablet Take 1 tablet (81 mg total) by mouth daily. Swallow whole.   atorvastatin 40 MG tablet Commonly known as: LIPITOR Take 1 tablet (40 mg total) by mouth daily.   clopidogrel 75 MG tablet Commonly known as: PLAVIX TAKE 1 TABLET BY MOUTH ONCE DAILY.   fenofibrate 160 MG tablet Take 1 tablet (160 mg total) by mouth daily.  ferrous sulfate 325 (65 FE) MG tablet Take 1 tablet (325 mg total) by mouth daily with breakfast. Start taking on: June 20, 2020   furosemide 40 MG tablet Commonly known as: LASIX Take 40 mg by mouth daily as needed for fluid.   glucagon 1 MG injection Inject 1 mg into the vein once as needed.   isosorbide mononitrate 30 MG 24 hr tablet Commonly known as: IMDUR Take 1 tablet (30 mg total) by mouth daily.   Lantus SoloStar 100 UNIT/ML Solostar Pen Generic drug: insulin glargine Inject 40 Units into the skin in the morning. What changed: how much to take   levothyroxine 175 MCG tablet Commonly known as: SYNTHROID Take 175 mcg by mouth daily before breakfast.   metoprolol succinate 25 MG 24 hr tablet Commonly known as: TOPROL-XL Take 0.5 tablets (12.5 mg total) by mouth in the morning and at bedtime.   nitroGLYCERIN 0.4 MG SL tablet Commonly known as: NITROSTAT Place 1 tablet (0.4 mg total) under the tongue every 5 (five) minutes as  needed for chest pain.   pantoprazole 40 MG tablet Commonly known as: PROTONIX Take 40 mg by mouth daily as needed (for acid reflux).   potassium chloride SA 20 MEQ tablet Commonly known as: KLOR-CON Take 20 mEq by mouth daily as needed (When taking Lasix (Furosemide)).       Procedures/Studies: DG Chest Port 1 View  Result Date: 06/18/2020 CLINICAL DATA:  Central chest pain today beginning at 1515 hours, history of MI, stroke, diabetes mellitus, essential hypertension EXAM: PORTABLE CHEST 1 VIEW COMPARISON:  Portable exam 1654 hours compared to 12/31/2019 FINDINGS: Upper normal heart size. Mediastinal contours and pulmonary vascularity normal. Lungs clear. No infiltrate, pleural effusion, or pneumothorax. Osseous structures unremarkable. IMPRESSION: No acute abnormalities. Electronically Signed   By: Lavonia Dana M.D.   On: 06/18/2020 17:12   ECHOCARDIOGRAM LIMITED  Result Date: 05/26/2020    ECHOCARDIOGRAM LIMITED REPORT   Patient Name:   LOLA COBLE Date of Exam: 05/26/2020 Medical Rec #:  [id number]             Height:       65.0 in Accession #:    [id number]            Weight:       225.0 lb Date of Birth:  [birth year]             BSA:          2.080 m Patient Age:    44 years              BP:           125/85 mmHg Patient Gender: F                     HR:           86 bpm. Exam Location:  [facility] Procedure: Limited Echo and Intracardiac Opacification Agent Indications:    APICAL HYPOKENISIS  History:        Patient has prior history of Echocardiogram examinations, most                 recent 01/01/2020. Stroke, Signs/Symptoms:Chest Pain; Risk                 Factors:Former Smoker, Dyslipidemia, Hypertension and Diabetes.  Sonographer:    Leavy Cella RDCS (AE) Referring Phys: [id number] [place]  1. The mid to distal anteroseptal wall is akinetic. The  apex is akinetic with some portions dyskinetic, there is no signs of apical thrombus with use of echocontrast.  The mid to distal inferoseptal wall is akinetic. Marland Kitchen Left ventricular ejection fraction,  by estimation, is 35 to 40%. The left ventricle has moderately decreased function. Left ventricular diastolic parameters are consistent with Grade III diastolic dysfunction (restrictive). Elevated left atrial pressure.  2. Right ventricular systolic function is normal. The right ventricular size is normal.  3. Left atrial size was mildly dilated.  4. The mitral valve is normal in structure. Mild mitral valve regurgitation. No evidence of mitral stenosis.  5. The inferior vena cava is normal in size with greater than 50% respiratory variability, suggesting right atrial pressure of 3 mmHg.  6. Limited echo to evaluate LV function and wall motion. FINDINGS  Left Ventricle: The mid to distal anteroseptal wall is akinetic. The apex is akinetic with some portions dyskinetic, there is no signs of apical thrombus with use of echocontrast. The mid to distal inferoseptal wall is akinetic. Left ventricular ejection fraction, by estimation, is 35 to 40%. The left ventricle has moderately decreased function. Definity contrast agent was given IV to delineate the left ventricular endocardial borders. Left ventricular diastolic parameters are consistent with Grade III diastolic dysfunction (restrictive). Elevated left atrial pressure. Right Ventricle: The right ventricular size is normal. No increase in right ventricular wall thickness. Right ventricular systolic function is normal. Left Atrium: Left atrial size was mildly dilated. Right Atrium: Right atrial size was normal in size. Pericardium: There is no evidence of pericardial effusion. Mitral Valve: The mitral valve is normal in structure. Mild mitral valve regurgitation. No evidence of mitral valve stenosis. Aortic Valve: Aortic valve mean gradient measures 4.1 mmHg. Aortic valve peak gradient measures 7.5 mmHg. Pulmonic Valve: The pulmonic valve was not well visualized. Pulmonic valve  regurgitation is not visualized. No evidence of pulmonic stenosis. Aorta: The aortic root is normal in size and structure. Venous: The inferior vena cava is normal in size with greater than 50% respiratory variability, suggesting right atrial pressure of 3 mmHg. IAS/Shunts: No atrial level shunt detected by color flow Doppler. LEFT VENTRICLE PLAX 2D LVIDd:         4.34 cm Diastology LVIDs:         3.27 cm LV e' medial:    6.31 cm/s LV PW:         1.43 cm LV E/e' medial:  15.7 LV IVS:        1.22 cm LV e' lateral:   6.96 cm/s                        LV E/e' lateral: 14.2  LEFT ATRIUM             Index LA diam:        3.60 cm 1.73 cm/m LA Vol (A2C):   62.6 ml 30.10 ml/m LA Vol (A4C):   49.5 ml 23.80 ml/m LA Biplane Vol: 57.6 ml 27.70 ml/m  AORTIC VALVE AV Vmax:           136.71 cm/s AV Vmean:          95.575 cm/s AV VTI:            0.259 m AV Peak Grad:      7.5 mmHg AV Mean Grad:      4.1 mmHg LVOT Vmax:         92.98 cm/s LVOT Vmean:  60.385 cm/s LVOT VTI:          0.159 m LVOT/AV VTI ratio: 0.62  AORTA Ao Root diam: 2.00 cm MITRAL VALVE MV Area (PHT): 5.50 cm    SHUNTS MV Decel Time: 138 msec    Systemic VTI: 0.16 m MR Peak grad: 83.2 mmHg MR Vmax:      456.00 cm/s MV E velocity: 99.00 cm/s MV A velocity: 60.40 cm/s MV E/A ratio:  1.64 Carlyle Dolly MD Electronically signed by Carlyle Dolly MD Signature Date/Time: 05/26/2020/11:43:13 AM    Final       Subjective: Pt says she feels well. No further chest pain, no SOB.    Discharge Exam: Vitals:   06/19/20 0756 06/19/20 0845  BP: 128/82 132/82  Pulse: 92 91  Resp: 16 16  Temp:  98.1 F (36.7 C)  SpO2:  100%   Vitals:   06/19/20 0654 06/19/20 0700 06/19/20 0756 06/19/20 0845  BP: 117/76 128/82 128/82 132/82  Pulse: 79 88 92 91  Resp: '16 16 16 16  '$ Temp: 98.2 F (36.8 C)   98.1 F (36.7 C)  TempSrc: Oral   Axillary  SpO2: 99%   100%  Weight:      Height:       General: Pt is alert, awake, not in acute distress Cardiovascular:  normal S1/S2 +, no rubs, no gallops Respiratory: CTA bilaterally, no wheezing, no rhonchi Abdominal: Soft, NT, ND, bowel sounds + Extremities: no edema, no cyanosis Neurological: nonfocal exam.     The results of significant diagnostics from this hospitalization (including imaging, microbiology, ancillary and laboratory) are listed below for reference.     Microbiology: Recent Results (from the past 240 hour(s))  Resp Panel by RT-PCR (Flu A&B, Covid) Nasopharyngeal Swab     Status: None   Collection Time: 06/18/20  7:22 PM   Specimen: Nasopharyngeal Swab; Nasopharyngeal(NP) swabs in vial transport medium  Result Value Ref Range Status   SARS Coronavirus 2 by RT PCR NEGATIVE NEGATIVE Final    Comment: (NOTE) SARS-CoV-2 target nucleic acids are NOT DETECTED.  The SARS-CoV-2 RNA is generally detectable in upper respiratory specimens during the acute phase of infection. The lowest concentration of SARS-CoV-2 viral copies this assay can detect is 138 copies/mL. A negative result does not preclude SARS-Cov-2 infection and should not be used as the sole basis for treatment or other patient management decisions. A negative result may occur with  improper specimen collection/handling, submission of specimen other than nasopharyngeal swab, presence of viral mutation(s) within the areas targeted by this assay, and inadequate number of viral copies(<138 copies/mL). A negative result must be combined with clinical observations, patient history, and epidemiological information. The expected result is Negative.  Fact Sheet for Patients:  EntrepreneurPulse.com.au  Fact Sheet for Healthcare Providers:  IncredibleEmployment.be  This test is no t yet approved or cleared by the [place] FDA and  has been authorized for detection and/or diagnosis of SARS-CoV-2 by FDA under an Emergency Use Authorization (EUA). This EUA will remain  in effect (meaning this  test can be used) for the duration of the COVID-19 declaration under Section 564(b)(1) of the Act, 21 U.S.C.section 360bbb-3(b)(1), unless the authorization is terminated  or revoked sooner.       Influenza A by PCR NEGATIVE NEGATIVE Final   Influenza B by PCR NEGATIVE NEGATIVE Final    Comment: (NOTE) The Xpert Xpress SARS-CoV-2/FLU/RSV plus assay is intended as an aid in the diagnosis of influenza from Nasopharyngeal swab specimens  and should not be used as a sole basis for treatment. Nasal washings and aspirates are unacceptable for Xpert Xpress SARS-CoV-2/FLU/RSV testing.  Fact Sheet for Patients: EntrepreneurPulse.com.au  Fact Sheet for Healthcare Providers: IncredibleEmployment.be  This test is not yet approved or cleared by the [place] FDA and has been authorized for detection and/or diagnosis of SARS-CoV-2 by FDA under an Emergency Use Authorization (EUA). This EUA will remain in effect (meaning this test can be used) for the duration of the COVID-19 declaration under Section 564(b)(1) of the Act, 21 U.S.C. section 360bbb-3(b)(1), unless the authorization is terminated or revoked.  Performed at [facility], [address], [place], [place] [address]      Labs: BNP (last 3 results) No results for input(s): BNP in the last 8760 hours. Basic Metabolic Panel: Recent Labs  Lab 06/13/20 1042 06/18/20 1722 06/19/20 0435  NA 137 133* 138  K 4.3 4.2 3.6  CL 109 109 111  CO2 22 20* 20*  GLUCOSE 100* 323* 86  BUN 28* 26* 24*  CREATININE 2.21* 2.18* 1.91*  CALCIUM 9.0 8.1* 8.2*  MG 1.8  --   --    Liver Function Tests: Recent Labs  Lab 06/19/20 0435  AST 23  ALT 24  ALKPHOS 41  BILITOT 0.3  PROT 5.1*  ALBUMIN 2.6*   No results for input(s): LIPASE, AMYLASE in the last 168 hours. No results for input(s): AMMONIA in the last 168 hours. CBC: Recent Labs  Lab 06/13/20 1042 06/18/20 1722 06/19/20 0836  06/19/20 1003  WBC 12.5* 8.5 8.4  --   HGB 9.2* 7.7* 7.4* 9.1*  HCT 30.4* 26.6* 25.2* 29.3*  MCV 84.0 84.7 84.8  --   PLT 569* 517* 488*  --    Cardiac Enzymes: No results for input(s): CKTOTAL, CKMB, CKMBINDEX, TROPONINI in the last 168 hours. BNP: Invalid input(s): POCBNP CBG: Recent Labs  Lab 06/18/20 2214 06/19/20 0747 06/19/20 0807 06/19/20 0928 06/19/20 1143  GLUCAP 110* 38* 78 180* 114*   D-Dimer No results for input(s): DDIMER in the last 72 hours. Hgb A1c No results for input(s): HGBA1C in the last 72 hours. Lipid Profile No results for input(s): CHOL, HDL, LDLCALC, TRIG, CHOLHDL, LDLDIRECT in the last 72 hours. Thyroid function studies No results for input(s): TSH, T4TOTAL, T3FREE, THYROIDAB in the last 72 hours.  Invalid input(s): FREET3 Anemia work up Recent Labs    06/18/20 1903  VITAMINB12 1,100*  FOLATE 5.6*  FERRITIN 4*  TIBC 441  IRON 12*  RETICCTPCT 2.0   Urinalysis    Component Value Date/Time   COLORURINE YELLOW 01/01/2020 0853   APPEARANCEUR HAZY (A) 01/01/2020 0853   LABSPEC 1.012 01/01/2020 0853   PHURINE 8.0 01/01/2020 0853   GLUCOSEU 50 (A) 01/01/2020 0853   HGBUR NEGATIVE 01/01/2020 0853   BILIRUBINUR NEGATIVE 01/01/2020 0853   KETONESUR NEGATIVE 01/01/2020 0853   PROTEINUR >=300 (A) 01/01/2020 0853   UROBILINOGEN 0.2 06/23/2014 1227   NITRITE NEGATIVE 01/01/2020 0853   LEUKOCYTESUR NEGATIVE 01/01/2020 0853   Sepsis Labs Invalid input(s): PROCALCITONIN,  WBC,  LACTICIDVEN Microbiology Recent Results (from the past 240 hour(s))  Resp Panel by RT-PCR (Flu A&B, Covid) Nasopharyngeal Swab     Status: None   Collection Time: 06/18/20  7:22 PM   Specimen: Nasopharyngeal Swab; Nasopharyngeal(NP) swabs in vial transport medium  Result Value Ref Range Status   SARS Coronavirus 2 by RT PCR NEGATIVE NEGATIVE Final    Comment: (NOTE) SARS-CoV-2 target nucleic acids are NOT DETECTED.  The SARS-CoV-2 RNA is generally detectable in  upper respiratory specimens during the acute phase of infection. The lowest concentration of SARS-CoV-2 viral copies this assay can detect is 138 copies/mL. A negative result does not preclude SARS-Cov-2 infection and should not be used as the sole basis for treatment or other patient management decisions. A negative result may occur with  improper specimen collection/handling, submission of specimen other than nasopharyngeal swab, presence of viral mutation(s) within the areas targeted by this assay, and inadequate number of viral copies(<138 copies/mL). A negative result must be combined with clinical observations, patient history, and epidemiological information. The expected result is Negative.  Fact Sheet for Patients:  EntrepreneurPulse.com.au  Fact Sheet for Healthcare Providers:  IncredibleEmployment.be  This test is no t yet approved or cleared by the [place] FDA and  has been authorized for detection and/or diagnosis of SARS-CoV-2 by FDA under an Emergency Use Authorization (EUA). This EUA will remain  in effect (meaning this test can be used) for the duration of the COVID-19 declaration under Section 564(b)(1) of the Act, 21 U.S.C.section 360bbb-3(b)(1), unless the authorization is terminated  or revoked sooner.       Influenza A by PCR NEGATIVE NEGATIVE Final   Influenza B by PCR NEGATIVE NEGATIVE Final    Comment: (NOTE) The Xpert Xpress SARS-CoV-2/FLU/RSV plus assay is intended as an aid in the diagnosis of influenza from Nasopharyngeal swab specimens and should not be used as a sole basis for treatment. Nasal washings and aspirates are unacceptable for Xpert Xpress SARS-CoV-2/FLU/RSV testing.  Fact Sheet for Patients: EntrepreneurPulse.com.au  Fact Sheet for Healthcare Providers: IncredibleEmployment.be  This test is not yet approved or cleared by the [place] FDA and has been  authorized for detection and/or diagnosis of SARS-CoV-2 by FDA under an Emergency Use Authorization (EUA). This EUA will remain in effect (meaning this test can be used) for the duration of the COVID-19 declaration under Section 564(b)(1) of the Act, 21 U.S.C. section 360bbb-3(b)(1), unless the authorization is terminated or revoked.  Performed at [facility], [address] [place], [place] [address]     Time coordinating discharge:   SIGNED:  Irwin Brakeman, MD  [facility] 06/19/2020, 11:54 AM How to contact the [facility] Attending or Consulting provider [place] or covering provider during after hours [place], for this patient?  1. Check the care team in [facility] and look for a) attending/consulting TR[facility] provider listed and b) the [facility] team listed 2. Log into [email or web address] and use [place]'s universal password to access. If you do not have the password, please contact the hospital operator. 3. Locate the [facility] provider you are looking for under [facility] and page to a number that you can be directly reached. 4. If you still have difficulty reaching the provider, please page the [facility] (Director on Call) for the Hospitalists listed on amion for assistance.

## 2020-06-19 NOTE — Progress Notes (Signed)
Hypoglycemic Event  CBG: 38  Treatment: Gave 16 oz OJ   Symptoms: None Pt alertx4 "states it happens all the time"   Follow-up CBG: Time: 0807 CBG Result:*78 Breakfast and 8 more oz given   Possible Reasons for Event: Pt not eating   Comments/MD notified: Wynetta Emery MD notified (paged Amion) No new orders received     [place]

## 2020-06-20 LAB — BPAM RBC
Blood Product Expiration Date: 202203132359
ISSUE DATE / TIME: 202202171216
Unit Type and Rh: 6200

## 2020-06-20 LAB — TYPE AND SCREEN
ABO/RH(D): A POS
Antibody Screen: POSITIVE
Donor AG Type: NEGATIVE
PT AG Type: NEGATIVE
Unit division: 0

## 2020-07-09 ENCOUNTER — Other Ambulatory Visit: Payer: Self-pay

## 2020-07-09 ENCOUNTER — Ambulatory Visit (INDEPENDENT_AMBULATORY_CARE_PROVIDER_SITE_OTHER): Payer: BLUE CROSS/BLUE SHIELD | Admitting: Student

## 2020-07-09 ENCOUNTER — Encounter: Payer: Self-pay | Admitting: Student

## 2020-07-09 VITALS — BP 130/80 | HR 99 | Ht 65.0 in | Wt 214.0 lb

## 2020-07-09 DIAGNOSIS — N184 Chronic kidney disease, stage 4 (severe): Secondary | ICD-10-CM

## 2020-07-09 DIAGNOSIS — I10 Essential (primary) hypertension: Secondary | ICD-10-CM | POA: Diagnosis not present

## 2020-07-09 DIAGNOSIS — Z79899 Other long term (current) drug therapy: Secondary | ICD-10-CM

## 2020-07-09 DIAGNOSIS — D509 Iron deficiency anemia, unspecified: Secondary | ICD-10-CM

## 2020-07-09 DIAGNOSIS — I25118 Atherosclerotic heart disease of native coronary artery with other forms of angina pectoris: Secondary | ICD-10-CM

## 2020-07-09 DIAGNOSIS — Z8673 Personal history of transient ischemic attack (TIA), and cerebral infarction without residual deficits: Secondary | ICD-10-CM

## 2020-07-09 DIAGNOSIS — I5042 Chronic combined systolic (congestive) and diastolic (congestive) heart failure: Secondary | ICD-10-CM | POA: Diagnosis not present

## 2020-07-09 MED ORDER — ISOSORBIDE MONONITRATE ER 30 MG PO TB24: 15.0000 mg | ORAL_TABLET | Freq: Every day | ORAL | 3 refills | Status: DC

## 2020-07-09 NOTE — Patient Instructions (Addendum)
Medication Instructions:  DECREASE Imdur to 15 mg daily *If you need a refill on your cardiac medications before your next appointment, please call your pharmacy*   Lab Work: CBC BMET  If you have labs (blood work) drawn today and your tests are completely normal, you will receive your results only by: Marland Kitchen MyChart Message (if you have MyChart) OR . A paper copy in the mail If you have any lab test that is abnormal or we need to change your treatment, we will call you to review the results.   Testing/Procedures: None    Follow-Up: At [facility], you and your health needs are our priority.  As part of our continuing mission to provide you with exceptional heart care, we have created designated Provider Care Teams.  These Care Teams include your primary Cardiologist (physician) and Advanced Practice Providers (APPs -  Physician Assistants and Nurse Practitioners) who all work together to provide you with the care you need, when you need it.  We recommend signing up for the patient portal called "MyChart".  Sign up information is provided on this After Visit Summary.  MyChart is used to connect with patients for Virtual Visits (Telemedicine).  Patients are able to view lab/test results, encounter notes, upcoming appointments, etc.  Non-urgent messages can be sent to your provider as well.   To learn more about what you can do with MyChart, go to [email or web address].    Your next appointment:   Keep follow up appointment with Carlyle Dolly, MD  Other Instructions Referral to [facility]

## 2020-07-09 NOTE — Progress Notes (Signed)
Cardiology Office Note    Date:  07/09/2020   ID:  Lauren [name], DOB [birth year], MRN [id number]  PCP:  Lemmie Evens, MD  Cardiologist: Carlyle Dolly, MD    Chief Complaint  Patient presents with  . Hospitalization Follow-up    History of Present Illness:    Lauren [name] is a 40 y.o. female with past medical history of CAD (s/p cath in 03/2014 showing 30% mid-LAD, moderate to severe disease along small D1, patent LCx, moderate to severe distal OM2 stenosis and moderate diffuse diease along RCA not amenable to PCI, NST in 02/2020 showing large apical scar with only mild ischemia), chronic combined systolic and diastolic CHF (EF 0000000 in 12/2019, at 35-40% by echo in 05/2020), Type 1 DM, HTN, HLD, anemia, history of CVA (occurred in 12/2019) and Stage 3-4 CKD who presents to the office today for hospital follow-up.  She was examined by Dr. Harl Bowie in 06/2020 and medical management of her cardiomyopathy was recommended at that time given small vessel disease by prior cath and her underlying CKD. Toprol-XL was increased to 12.5 mg twice daily with consideration of adding Hydralazine in the future. In the interim, she presented to [facility] ED later that month for evaluation of chest pain which occurred while driving home. She was found to have acute on chronic anemia with hemoglobin at 7.7 and occult blood was negative. Ferritin was low at 4 and she was started on iron supplementation. Troponin values peaked at 271 and were felt to be most consistent with demand ischemia in the setting of her anemia. She did receive 1 unit PRBCs with improvement in her symptoms. She was continued on Toprol-XL and Imdur for her cardiomyopathy as BP did not allow for the addition of Hydralazine at that time. She was encouraged to establish with Nephrology as an outpatient given her CKD as creatinine was at 2.18 during admission.  In talking with the patient today, she reports she initially felt  "the best she has in months" following her hospitalization but over the past few weeks has noticed recurrent dyspnea on exertion and chest discomfort. Reports feeling like she did when she was anemic during her hospital stay. She is scheduled to see a gynecologist later this month for menorrhagia. She has remained on iron supplementation and did follow-up with her PCP but does not believe repeat labs been obtained since hospital discharge. Denies any specific orthopnea, PND, lower extremity edema or palpitations. She has been experiencing persistent headaches for the past few months and questions if this is due to any of her medications (she was started on Imdur in 02/2020).    Past Medical History:  Diagnosis Date  . CAD (coronary artery disease)    a. s/p cath in 03/2014 showing 30% mid-LAD, moderate to severe disease along small D1, patent LCx, moderate to severe distal OM2 stenosis and moderate diffuse diease along RCA not amenable to PCI  . CHF (congestive heart failure) ([place])    a. EF 55-60% in 12/2019 b. EF at 35-40% by echo in 05/2020  . Diabetes mellitus without complication ([place])   . Myocardial infarction ([place])   . Renal disorder    stage 3 kidney disease    Past Surgical History:  Procedure Laterality Date  . Cardiac catherization    . CHOLECYSTECTOMY    . TUBAL LIGATION      Current Medications: Outpatient Medications Prior to Visit  Medication Sig Dispense Refill  . aspirin EC 81 MG EC tablet  Take 1 tablet (81 mg total) by mouth daily. Swallow whole. 30 tablet 0  . atorvastatin (LIPITOR) 40 MG tablet Take 1 tablet (40 mg total) by mouth daily. 90 tablet 1  . clopidogrel (PLAVIX) 75 MG tablet TAKE 1 TABLET BY MOUTH ONCE DAILY. 30 tablet 6  . Continuous Blood Gluc Sensor (DEXCOM G6 SENSOR) MISC SMARTSIG:1 Each Topical Every 10 Days    . Continuous Blood Gluc Transmit (DEXCOM G6 TRANSMITTER) MISC     . fenofibrate 160 MG tablet Take 1 tablet (160 mg total) by mouth daily. 90  tablet 1  . ferrous sulfate 325 (65 FE) MG tablet Take 1 tablet (325 mg total) by mouth daily with breakfast. 30 tablet 1  . furosemide (LASIX) 40 MG tablet Take 40 mg by mouth daily as needed for fluid.     Marland Kitchen glucagon (GLUCAGON EMERGENCY) 1 MG injection Inject 1 mg into the vein once as needed. 1 each 12  . insulin glargine (LANTUS SOLOSTAR) 100 UNIT/ML Solostar Pen Inject 40 Units into the skin in the morning. 15 mL 11  . levothyroxine (SYNTHROID) 175 MCG tablet Take 175 mcg by mouth daily before breakfast.   10  . metoprolol succinate (TOPROL-XL) 25 MG 24 hr tablet Take 0.5 tablets (12.5 mg total) by mouth in the morning and at bedtime. 90 tablet 1  . nitroGLYCERIN (NITROSTAT) 0.4 MG SL tablet Place 1 tablet (0.4 mg total) under the tongue every 5 (five) minutes as needed for chest pain. 25 tablet 3  . pantoprazole (PROTONIX) 40 MG tablet Take 40 mg by mouth daily as needed (for acid reflux).     . potassium chloride SA (K-DUR,KLOR-CON) 20 MEQ tablet Take 20 mEq by mouth daily as needed (When taking Lasix (Furosemide)).     . isosorbide mononitrate (IMDUR) 30 MG 24 hr tablet Take 1 tablet (30 mg total) by mouth daily. 30 tablet 6   No facility-administered medications prior to visit.     Allergies:   Wellbutrin [bupropion], Ciprofloxacin hcl, and Tape   Social History   Socioeconomic History  . Marital status: Single    Spouse name: Not on file  . Number of children: Not on file  . Years of education: Not on file  . Highest education level: Not on file  Occupational History  . Not on file  Tobacco Use  . Smoking status: Former Smoker    Packs/day: 1.00    Types: Cigarettes  . Smokeless tobacco: Never Used  Vaping Use  . Vaping Use: Never used  Substance and Sexual Activity  . Alcohol use: No    Alcohol/week: 0.0 standard drinks  . Drug use: No  . Sexual activity: Not on file  Other Topics Concern  . Not on file  Social History Narrative  . Not on file   Social  Determinants of Health   Financial Resource Strain: Not on file  Food Insecurity: Not on file  Transportation Needs: Not on file  Physical Activity: Not on file  Stress: Not on file  Social Connections: Not on file     Family History:  The patient's family history includes CAD in her father and mother; CVA in her mother; Hyperlipidemia in her father and mother; Hypertension in her father and mother; Thyroid disease in her mother.   Review of Systems:   Please see the history of present illness.     General:  No chills, fever, night sweats or weight changes.  Cardiovascular:  No edema, orthopnea, palpitations, paroxysmal  nocturnal dyspnea. Positive for chest pain and dyspnea on exertion.  Dermatological: No rash, lesions/masses Respiratory: No cough, dyspnea Urologic: No hematuria, dysuria Abdominal:   No nausea, vomiting, diarrhea, bright red blood per rectum, melena, or hematemesis Neurologic:  No visual changes, wkns, changes in mental status. All other systems reviewed and are otherwise negative except as noted above.   Physical Exam:    VS:  BP 130/80   Pulse 99   Ht '5\' 5"'$  (1.651 m)   Wt 214 lb (97.1 kg)   SpO2 99%   BMI 35.61 kg/m    General: Well developed, well nourished,female appearing in no acute distress. Head: Normocephalic, atraumatic. Neck: No carotid bruits. JVD not elevated.  Lungs: Respirations regular and unlabored, without wheezes or rales.  Heart: Regular rate and rhythm. No S3 or S4.  No murmur, no rubs, or gallops appreciated. Abdomen: Appears non-distended. No obvious abdominal masses. Msk:  Strength and tone appear normal for age. No obvious joint deformities or effusions. Extremities: No clubbing or cyanosis. No lower extremity edema.  Distal pedal pulses are 2+ bilaterally. Neuro: Alert and oriented X 3. Moves all extremities spontaneously. No focal deficits noted. Psych:  Responds to questions appropriately with a normal affect. Skin: No rashes  or lesions noted  Wt Readings from Last 3 Encounters:  07/09/20 214 lb (97.1 kg)  06/18/20 216 lb 9.6 oz (98.2 kg)  06/13/20 218 lb (98.9 kg)      Studies/Labs Reviewed:   EKG:  EKG is not ordered today.   Recent Labs: 06/13/2020: Magnesium 1.8; TSH 0.268 06/19/2020: ALT 24; BUN 24; Creatinine, Ser 1.91; Hemoglobin 9.1; Platelets 488; Potassium 3.6; Sodium 138   Lipid Panel    Component Value Date/Time   CHOL 100 06/13/2020 1043   TRIG 109 06/13/2020 1043   HDL 23 (L) 06/13/2020 1043   CHOLHDL 4.3 06/13/2020 1043   VLDL 22 06/13/2020 1043   LDLCALC 55 06/13/2020 1043   LDLDIRECT 84.9 01/01/2020 0319    Additional studies/ records that were reviewed today include:   NST: 02/2020  No diagnostic ST segment changes to indicate ischemia.  Large, severe intensity, periapical defect extending into the anterolateral wall and inferior septum that is largely fixed and consistent with infarct scar. There is a small region of apical anterolateral reversibility indicating a mild ischemic territory.  This is a high risk study based on defect size although region is most consistent with scar and a mild region of peri-infarct ischemia.  Nuclear stress EF: 48%. Large area of periapical akinesis.  Limited Echo: 05/2020 IMPRESSIONS    1. The mid to distal anteroseptal wall is akinetic. The apex is akinetic  with some portions dyskinetic, there is no signs of apical thrombus with  use of echocontrast. The mid to distal inferoseptal wall is akinetic. Marland Kitchen  Left ventricular ejection fraction,  by estimation, is 35 to 40%. The left ventricle has moderately decreased  function. Left ventricular diastolic parameters are consistent with Grade  III diastolic dysfunction (restrictive). Elevated left atrial pressure.  2. Right ventricular systolic function is normal. The right ventricular  size is normal.  3. Left atrial size was mildly dilated.  4. The mitral valve is normal in structure.  Mild mitral valve  regurgitation. No evidence of mitral stenosis.  5. The inferior vena cava is normal in size with greater than 50%  respiratory variability, suggesting right atrial pressure of 3 mmHg.  6. Limited echo to evaluate LV function and wall motion.  Assessment:    1. Coronary artery disease of native artery of native heart with stable angina pectoris ([place])   2. Chronic combined systolic and diastolic heart failure ([place])   3. Essential hypertension   4. History of CVA (cerebrovascular accident)   5. Iron deficiency anemia, unspecified iron deficiency anemia type   6. Medication management   7. CKD (chronic kidney disease) stage 4, GFR 15-29 ml/min (HCC)      Plan:   In order of problems listed above:  1. CAD - She is s/p cath in 03/2014 showing 30% mid-LAD, moderate to severe disease along small D1, patent LCx, moderate to severe distal OM2 stenosis and moderate diffuse diease along RCA not amenable to PCI. Most recent ischemic eval was a NST in 02/2020 showing large apical scar with only mild ischemia and a repeat cath has not been pursued given her anemia requiring transfusions and Stage 3-4 CKD.  - She initially felt well following her recent hospitalization but reports developing recurrent chest pain and dyspnea in the interim resembling her prior anemia. Will recheck CBC and BMET today.  - Continue ASA, Plavix (on this per Neurology), Atorvastatin '40mg'$  daily and Toprol-XL 12.'5mg'$  BID. Will temporarily reduce Imdur from '30mg'$  daily to '15mg'$  daily for a few weeks to see if this helps with her headaches.   2. Chronic Combined Systolic and Diastolic CHF - Her EF was at 35-40% by echo in 05/2020. She denies any recent orthopnea, PND or edema and does not appear volume overloaded by examination today.  - Will continue Toprol-XL 12.'5mg'$  BID and PRN Lasix. She has been experiencing headaches on a daily basis over the past few months and is insure if this is due to Imdur or her  recent CVA. Will reduce Imdur to '15mg'$  daily to see if this helps with symptoms. We discussed further titration of Toprol-XL to '25mg'$  in AM/12.'5mg'$  in PM or adding Hydralazine but she reports her BP dropped with dose adjustment of Toprol-XL in the past. Therefore, I have asked her to keep a BP log for a few weeks and report back on her readings so we can further evaluate if her medications can be titrated. No ACE-I/ARB/ARNI given her underlying CKD.   3. HTN - BP is well-controlled at 130/80 during today's visit. She will keep a log and report back with readings as I would like to add low-dose Hydralazine if BP allows to help with her cardiomyopathy.   4. History of CVA - Occurred in 12/2019 and she has not followed-up with Neurology as an outpatient. Will refer back to Neurology for further evaluation. Remains on ASA, Plavix and statin therapy.   5. Anemia - Hgb was at 7.4 when checked in 06/2020. Will recheck CBC today. She remains on iron supplementation as Ferritin was at 4 during her recent admission. Following up with OBGYN within the next month for menorrhagia.   6. Stage 3-4 CKD - Creatinine was at 1.9 - 2.2 in 06/2020. Will recheck BMET.    Medication Adjustments/Labs and Tests Ordered: Current medicines are reviewed at length with the patient today.  Concerns regarding medicines are outlined above.  Medication changes, Labs and Tests ordered today are listed in the Patient Instructions below. Patient Instructions  Medication Instructions:  DECREASE Imdur to 15 mg daily *If you need a refill on your cardiac medications before your next appointment, please call your pharmacy*   Lab Work: CBC BMET  If you have labs (blood work) drawn today and your tests  are completely normal, you will receive your results only by: Marland Kitchen MyChart Message (if you have MyChart) OR . A paper copy in the mail If you have any lab test that is abnormal or we need to change your treatment, we will call you to  review the results.   Testing/Procedures: None    Follow-Up: At [facility], you and your health needs are our priority.  As part of our continuing mission to provide you with exceptional heart care, we have created designated Provider Care Teams.  These Care Teams include your primary Cardiologist (physician) and Advanced Practice Providers (APPs -  Physician Assistants and Nurse Practitioners) who all work together to provide you with the care you need, when you need it.  We recommend signing up for the patient portal called "MyChart".  Sign up information is provided on this After Visit Summary.  MyChart is used to connect with patients for Virtual Visits (Telemedicine).  Patients are able to view lab/test results, encounter notes, upcoming appointments, etc.  Non-urgent messages can be sent to your provider as well.   To learn more about what you can do with MyChart, go to [email or web address].    Your next appointment:   Keep follow up appointment with Carlyle Dolly, MD  Other Instructions Referral to [facility]        Signed, Erma Heritage, PA-C  07/09/2020 8:18 PM    [place]. [address] [place], [place] [address] Phone: [phone] Fax: [phone]

## 2020-07-11 ENCOUNTER — Other Ambulatory Visit (HOSPITAL_COMMUNITY)
Admission: RE | Admit: 2020-07-11 | Discharge: 2020-07-11 | Disposition: A | Discharge status: Home / Self Care | Payer: BLUE CROSS/BLUE SHIELD | Source: Ambulatory Visit | Attending: Student | Admitting: Student

## 2020-07-11 ENCOUNTER — Other Ambulatory Visit: Payer: Self-pay

## 2020-07-11 DIAGNOSIS — Z79899 Other long term (current) drug therapy: Secondary | ICD-10-CM | POA: Diagnosis present

## 2020-07-11 DIAGNOSIS — D509 Iron deficiency anemia, unspecified: Secondary | ICD-10-CM | POA: Insufficient documentation

## 2020-07-11 LAB — CBC
HCT: 39.3 % (ref 36.0–46.0)
Hemoglobin: 12.1 g/dL (ref 12.0–15.0)
MCH: 26.9 pg (ref 26.0–34.0)
MCHC: 30.8 g/dL (ref 30.0–36.0)
MCV: 87.3 fL (ref 80.0–100.0)
Platelets: 496 10*3/uL — ABNORMAL HIGH (ref 150–400)
RBC: 4.5 MIL/uL (ref 3.87–5.11)
RDW: 18.3 % — ABNORMAL HIGH (ref 11.5–15.5)
WBC: 9.5 10*3/uL (ref 4.0–10.5)
nRBC: 0 % (ref 0.0–0.2)

## 2020-07-11 LAB — BASIC METABOLIC PANEL
Anion gap: 7 (ref 5–15)
BUN: 22 mg/dL — ABNORMAL HIGH (ref 6–20)
CO2: 21 mmol/L — ABNORMAL LOW (ref 22–32)
Calcium: 8.8 mg/dL — ABNORMAL LOW (ref 8.9–10.3)
Chloride: 110 mmol/L (ref 98–111)
Creatinine, Ser: 1.94 mg/dL — ABNORMAL HIGH (ref 0.44–1.00)
GFR, Estimated: 33 mL/min — ABNORMAL LOW (ref >60)
Glucose, Bld: 165 mg/dL — ABNORMAL HIGH (ref 70–99)
Potassium: 4.6 mmol/L (ref 3.5–5.1)
Sodium: 138 mmol/L (ref 135–145)

## 2020-07-23 ENCOUNTER — Encounter: Payer: Self-pay | Admitting: Adult Health

## 2020-07-23 ENCOUNTER — Ambulatory Visit (INDEPENDENT_AMBULATORY_CARE_PROVIDER_SITE_OTHER): Payer: BLUE CROSS/BLUE SHIELD | Admitting: Adult Health

## 2020-07-23 ENCOUNTER — Other Ambulatory Visit: Payer: Self-pay

## 2020-07-23 ENCOUNTER — Other Ambulatory Visit (HOSPITAL_COMMUNITY)
Admission: RE | Admit: 2020-07-23 | Discharge: 2020-07-23 | Disposition: A | Discharge status: Home / Self Care | Payer: BLUE CROSS/BLUE SHIELD | Source: Ambulatory Visit | Attending: Adult Health | Admitting: Adult Health

## 2020-07-23 VITALS — BP 119/78 | HR 85 | Ht 65.0 in | Wt 216.2 lb

## 2020-07-23 DIAGNOSIS — N946 Dysmenorrhea, unspecified: Secondary | ICD-10-CM | POA: Diagnosis not present

## 2020-07-23 DIAGNOSIS — N920 Excessive and frequent menstruation with regular cycle: Secondary | ICD-10-CM | POA: Insufficient documentation

## 2020-07-23 DIAGNOSIS — Z01419 Encounter for gynecological examination (general) (routine) without abnormal findings: Secondary | ICD-10-CM | POA: Insufficient documentation

## 2020-07-23 DIAGNOSIS — D500 Iron deficiency anemia secondary to blood loss (chronic): Secondary | ICD-10-CM | POA: Insufficient documentation

## 2020-07-23 LAB — POCT HEMOGLOBIN: Hemoglobin: 11.1 g/dL (ref 11–14.6)

## 2020-07-23 NOTE — Progress Notes (Signed)
Patient ID: Lauren [name], female   DOB: [birth year], 41 y.o.   MRN: [id number] History of Present Illness:  Lauren [name] is a 40 year old white female, married, G4 P2, referred by Dr Karie Kirks for bleeding and anemia. She was seen at [facility] 06/18/20 for chest pain and found to have HGB of 7.7 and had blood transfusion.  She says that periods have been heavy for last 2 years, lasting 5-6 days with clots, the size of a baseball and may change pads every hour. Has some cramping relieved by Midol. Last pap about 12 years ago. She has history of MI,stroke, CHF, stage 3 kidney disease and is on Plavix and ASA.  PCP is Dr Karie Kirks.  Current Medications, Allergies, Past Medical History, Past Surgical History, Family History and Social History were reviewed in [employer] record.     Review of Systems: Patient denies any headaches, hearing loss, fatigue, blurred vision, shortness of breath, chest pain, abdominal pain, problems with bowel movements, urination, or intercourse. No joint pain or mood swings. Denies any bowel changes or rectal bleeding. See HPI for positives     Physical Exam:BP 119/78 (BP Location: Right Arm, Patient Position: Sitting, Cuff Size: Normal)   Pulse 85   Ht '5\' 5"'$  (1.651 m)   Wt 216 lb 3.2 oz (98.1 kg)   LMP 07/11/2020 (Exact Date)   BMI 35.98 kg/m  HGB 11.1 General:  Well developed, well nourished, no acute distress Skin:  Warm and dry Neck:  Midline trachea, normal thyroid, good ROM, no lymphadenopathy Lungs; Clear to auscultation bilaterally Cardiovascular: Regular rate and rhythm Pelvic:  External genitalia is normal in appearance, no lesions.  The vagina is normal in appearance. Urethra has no lesions or masses. The cervix is bulbous. Pap with GC/CHL and HR HPV performed. Uterus is felt to be normal size, shape, and contour.  No adnexal masses,mild LLQ tenderness noted.Bladder is non tender, no masses felt. Psych:  No mood changes, alert  and cooperative,seems happy AA is 1 Fall risk is low PHQ 9 score is 4 GAD 7 score is 0  Upstream - 07/23/20 1001      Pregnancy Intention Screening   Does the patient want to become pregnant in the next year? No    Does the patient's partner want to become pregnant in the next year? No    Would the patient like to discuss contraceptive options today? No      Contraception Wrap Up   Current Method Female Sterilization    End Method Female Sterilization    Contraception Counseling Provided No         Examination chaperoned by [employer] RN  Impression and Plan: 1. Iron deficiency anemia due to chronic blood loss Continue iron  2. Menorrhagia with regular cycle -will get pelvic US to assess uterus and ovaries Discussed options to control bleeding,IUD or ablation depending on [name]    3. Encounter for cervical Pap smear with pelvic exam Pap sent  4. Menstrual cramps

## 2020-07-23 NOTE — Addendum Note (Signed)
Addended by: Dorita Sciara, Devynn Hessler A on: 07/23/2020 11:07 AM   Modules accepted: Orders

## 2020-07-25 LAB — CYTOLOGY - PAP
Chlamydia: NEGATIVE
Comment: NEGATIVE
Comment: NEGATIVE
Comment: NORMAL
Diagnosis: NEGATIVE
Diagnosis: REACTIVE
High risk HPV: NEGATIVE
Neisseria Gonorrhea: NEGATIVE

## 2020-07-29 ENCOUNTER — Ambulatory Visit (HOSPITAL_COMMUNITY)
Admission: RE | Admit: 2020-07-29 | Discharge: 2020-07-29 | Disposition: A | Discharge status: Home / Self Care | Payer: BLUE CROSS/BLUE SHIELD | Source: Ambulatory Visit | Attending: Adult Health | Admitting: Adult Health

## 2020-07-29 DIAGNOSIS — N920 Excessive and frequent menstruation with regular cycle: Secondary | ICD-10-CM | POA: Diagnosis present

## 2020-07-29 IMAGING — US US PELVIS COMPLETE WITH TRANSVAGINAL
1 series · 13 of 25 positions shown · non-contrast
Comparison: None

CLINICAL DATA: Menorrhagia, anemia, LMP 3 weeks ago specific date
not provided

EXAM:
TRANSABDOMINAL AND TRANSVAGINAL ULTRASOUND OF PELVIS
TECHNIQUE: Both transabdominal and transvaginal ultrasound examinations of the
pelvis were performed. Transabdominal technique was performed for
global imaging of the pelvis including uterus, ovaries, adnexal
regions, and pelvic cul-de-sac. It was necessary to proceed with
endovaginal exam following the transabdominal exam to visualize the
RIGHT ovary.

[Series 1: us pelvic complete with transvaginal · 13 of 65 slices shown]
[im 1/65]
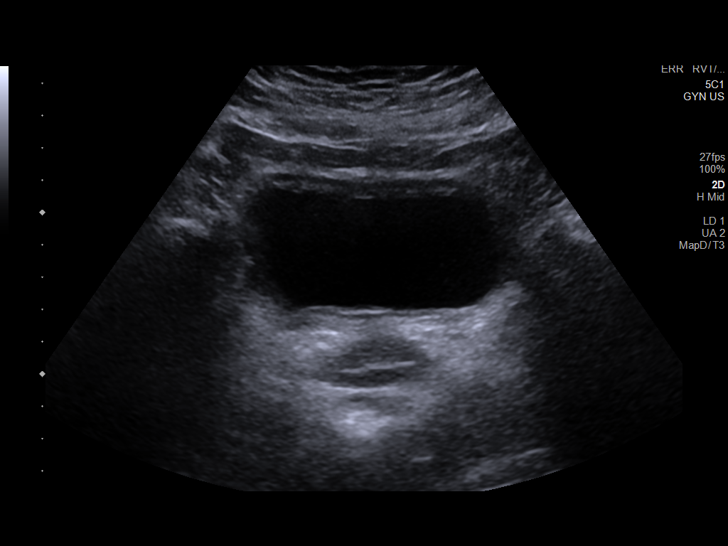
[im 6/65]
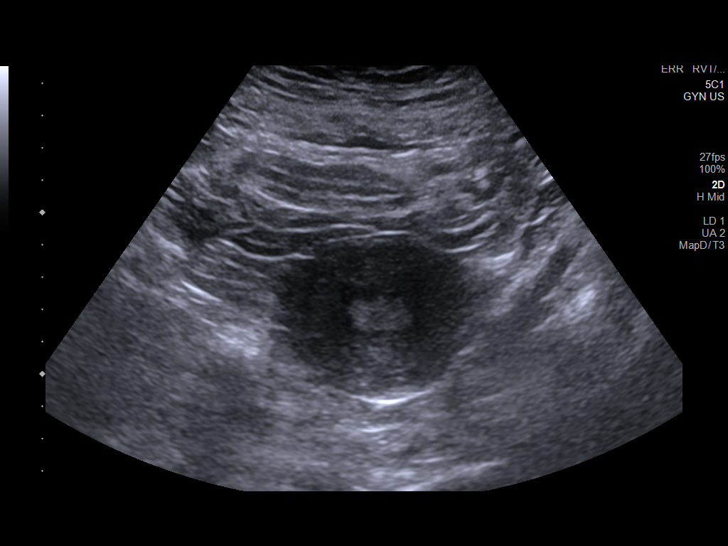
[im 11/65]
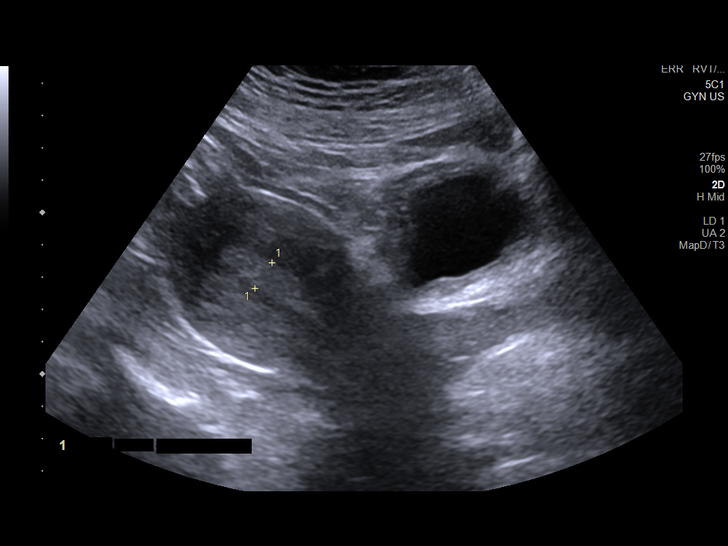
[im 17/65]
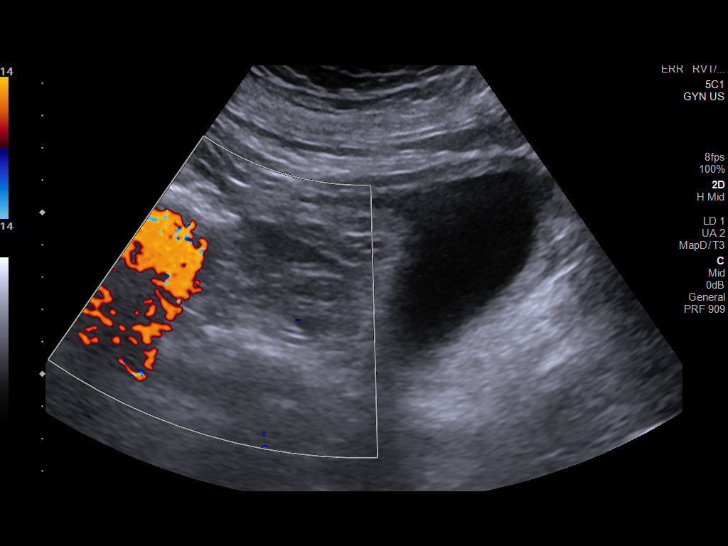
[im 22/65]
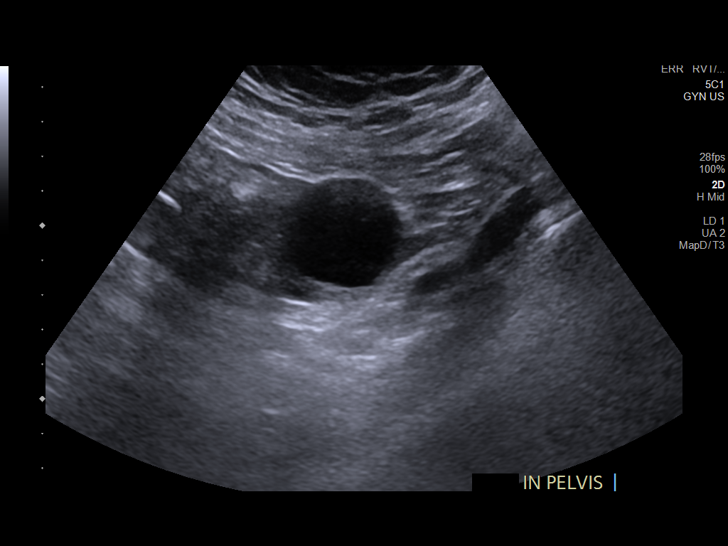
[im 27/65]
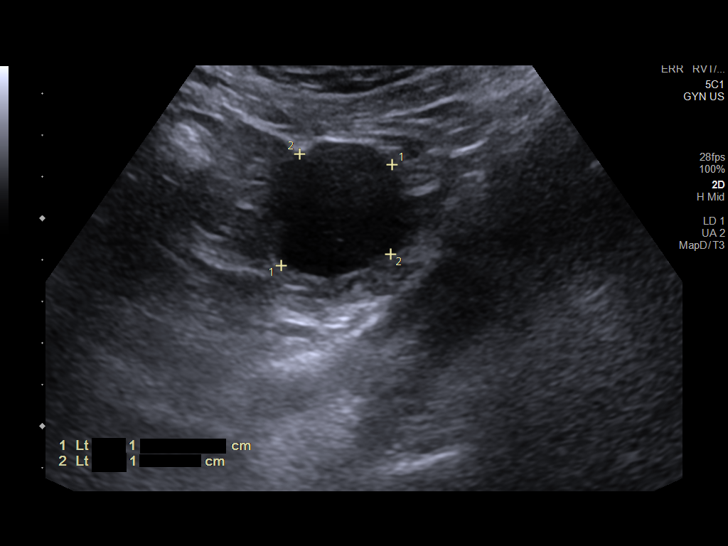
[im 33/65]
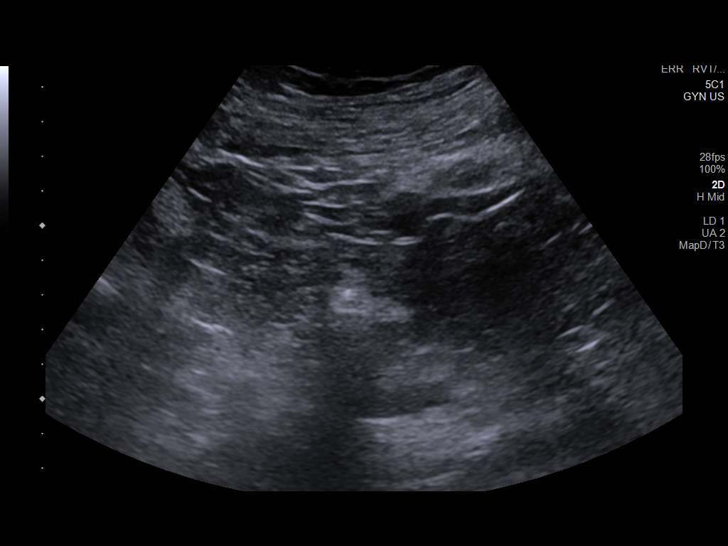
[im 38/65]
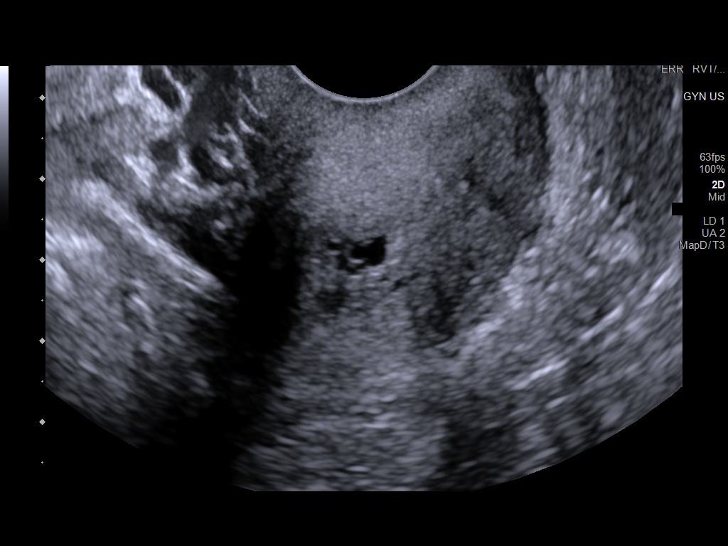
[im 43/65]
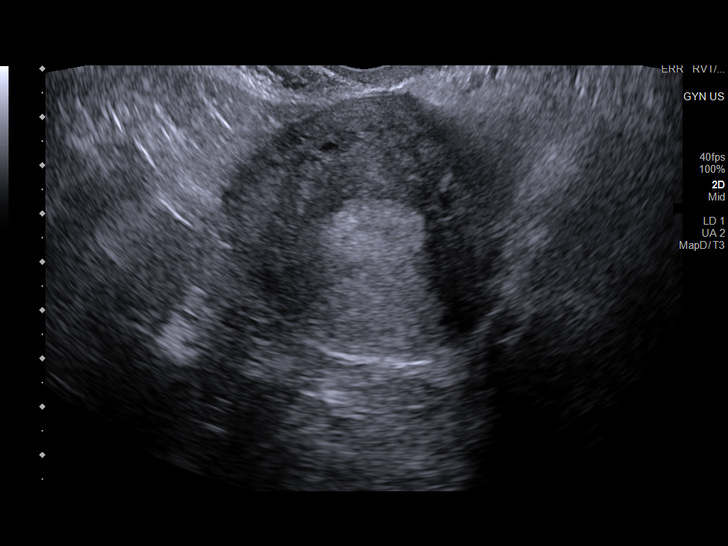
[im 49/65]
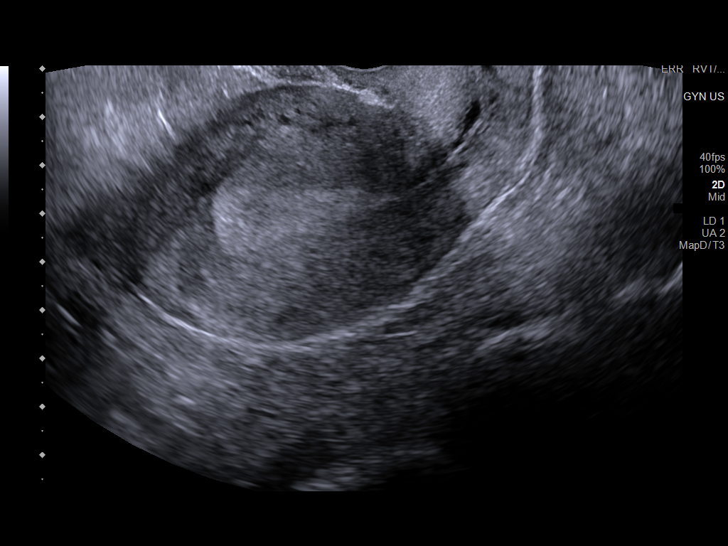
[im 54/65]
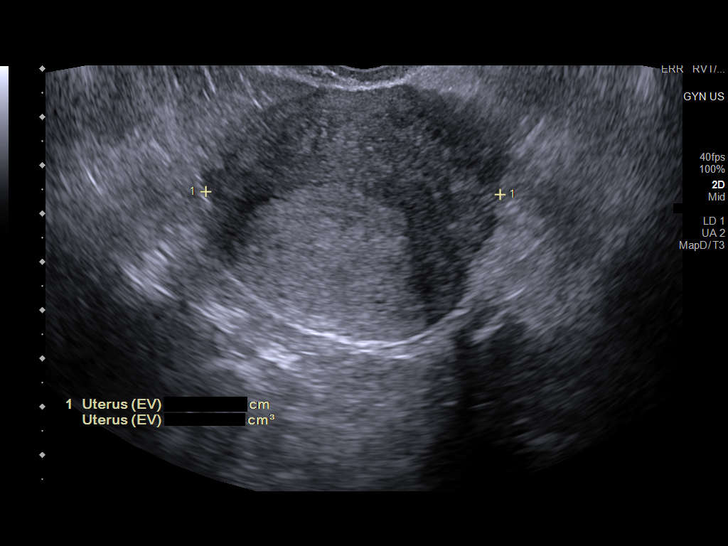
[im 59/65]
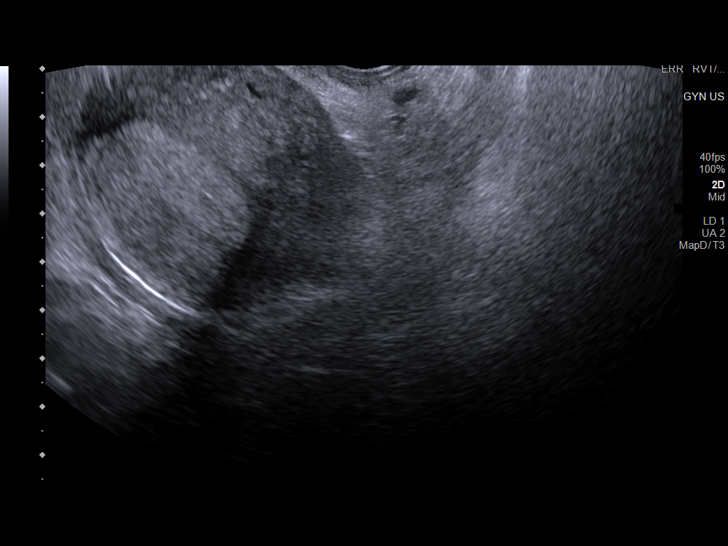
[im 65/65]
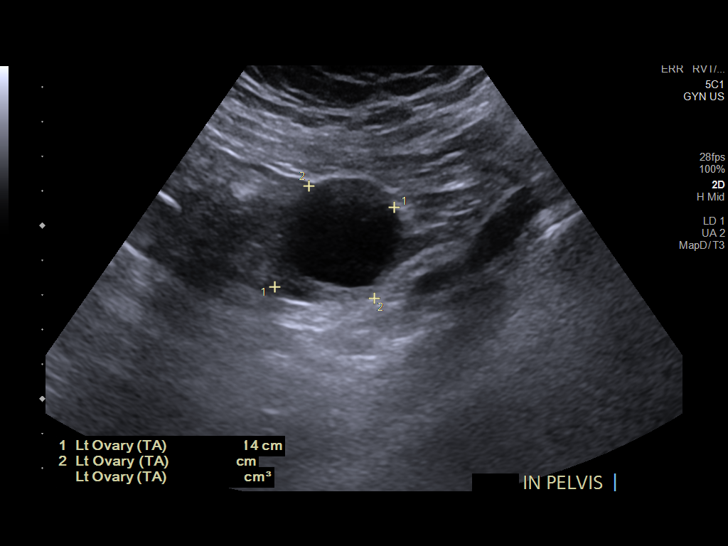

[13 of 25 positions shown; findings below may reference images not displayed]

FINDINGS: Uterus

Measurements: 9.4 x 5.5 x 6.1 cm = volume: 166 mL. Anteverted.
Normal morphology without mass. Nabothian cysts at cervix.

Endometrium

Thickness: 14 mm.  No endometrial fluid or focal abnormality

Right ovary

Not visualized, likely obscured by bowel

Left ovary

Measurements: 4.1 x 3.8 x 4.0 cm = volume: 22.8 mL. Simple cyst LEFT
ovary 3.6 x 3.3 x 2.9 cm; No follow up imaging recommended. Note:
This recommendation does not apply to premenarchal patients or to
those with increased risk (genetic, family history, elevated tumor
markers or other high-risk factors) of ovarian cancer. Reference:
Radiology [DATE]):[id number].

Other findings

No free pelvic fluid.  No adnexal masses.
IMPRESSION: Nonvisualization of RIGHT ovary.

3.6 cm diameter simple cyst LEFT ovary; no follow-up imaging
recommended, as above.

Remainder of exam unremarkable.

## 2020-08-05 ENCOUNTER — Telehealth: Payer: Self-pay | Admitting: Adult Health

## 2020-08-05 NOTE — Telephone Encounter (Signed)
Pt called to check on U/S results Please advise & call pt (not available after 5:30 she has classes tonight)

## 2020-08-06 NOTE — Telephone Encounter (Signed)
Koni is interested in getting endometrial ablation not IUD, will make appt with Dr Elonda Husky

## 2020-08-25 ENCOUNTER — Ambulatory Visit (INDEPENDENT_AMBULATORY_CARE_PROVIDER_SITE_OTHER): Payer: BLUE CROSS/BLUE SHIELD | Admitting: Cardiology

## 2020-08-25 ENCOUNTER — Encounter: Payer: Self-pay | Admitting: Cardiology

## 2020-08-25 VITALS — BP 160/102 | HR 93 | Ht 65.0 in | Wt 216.8 lb

## 2020-08-25 DIAGNOSIS — I25118 Atherosclerotic heart disease of native coronary artery with other forms of angina pectoris: Secondary | ICD-10-CM

## 2020-08-25 DIAGNOSIS — I5022 Chronic systolic (congestive) heart failure: Secondary | ICD-10-CM | POA: Diagnosis not present

## 2020-08-25 DIAGNOSIS — I10 Essential (primary) hypertension: Secondary | ICD-10-CM

## 2020-08-25 DIAGNOSIS — E782 Mixed hyperlipidemia: Secondary | ICD-10-CM | POA: Diagnosis not present

## 2020-08-25 MED ORDER — HYDRALAZINE HCL 25 MG PO TABS: 12.5000 mg | ORAL_TABLET | Freq: Three times a day (TID) | ORAL | 1 refills | Status: DC

## 2020-08-25 NOTE — Patient Instructions (Signed)
Your physician recommends that you schedule a follow-up appointment in: [place] has recommended you make the following change in your medication:   START HYDRALAZINE 12.5 MG (1/2 TABLET THREE TIMES DAILY)   Thank you for choosing [facility]!!

## 2020-08-25 NOTE — Progress Notes (Signed)
Clinical Summary Ms. Joerg is a 40 y.o.female seen today for follow up of the following medical problems.  1.CAD/ Elevated troponin - admitted 01/2020 with CVA, iin this setting elevated trop to 413. EKG without acute ischemic changes - echo with some apical hypokinesis - workup deferred to allow her time to recover from stroke, also thought was this could be a stress induced CM  02/2020 nuclear stress: large periapical defect consistent with scar, mild apical anterolateral ischemia. - of note she does report an MI in [facility] 5+years ago but reportedly told there were no blockages. [facility] in [place]  Jan 2022 echo LVEF 35-40%, grade III dd, apex akinetic  Echo reviewed today, LVEF 35-40% with apical akinesis. I reviewed the echo which I had read in 12/2019 and compared images side by side. 12/2019 LVEF likely an overestimate and closer to 40%, difficult assessment given the base and mid ventricle move well and main dysfunction is focused in the apex.  - has not required lasix    03/2014 Records from [facility] - admitted with DKA, found to have elevated troponins - echo with normal LVEF 65-70% - during stress test had new LBBB, referred for cath - 03/2014 cath: LVEF >60%, mid LAD 30%, D1 mod to severe small vessel, distally pruned, distal 50-60% disease. LCX patent, OM2, mild disease with distal mod to severe, RCA diffuse moderate disease not ameanble to PCI    - no recent chest pain - no SOB or DOE.  - compliant with meds. Headaches improved on imdur '15mg'$    2. Chronic systolic HF - imdur lowered to '15mg'$  daily due to headaches last visit - side effects of dizziness on higher beta blocker dosing, titrate slowly) - avoiding ACe/ARB/ARNI/aldactone given poor renal function. Last GFR 33  - no recent SOB/DOE. No recent edema.    3. CKD - followed by pcp - reports seeing neprhology some time ago but not recently   4. History of CVA - admitted  01/2020 with CVA, - on DAPT for prior CVA, not for a cardiac reason.  5. Anemia - Hgb was at 7.4 when checked in 06/2020.  - admit 06/2020 with chest pain, mild trop 200s. Anemia during admit. Thought to be demandi ischemia, managed medically - last Hgb was 12.1  6. HTN - compliant with meds - home bps 130s/90s   8. DM2 - followed by pcp  9. 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 Anemia   . CAD (coronary artery disease)    a. s/p cath in 03/2014 showing 30% mid-LAD, moderate to severe disease along small D1, patent LCx, moderate to severe distal OM2 stenosis and moderate diffuse diease along RCA not amenable to PCI  . CHF (congestive heart failure) ([place])    a. EF 55-60% in 12/2019 b. EF at 35-40% by echo in 05/2020  . Diabetes mellitus without complication ([place])   . Myocardial infarction ([place])   . Renal disorder    stage 3 kidney disease  . Stroke [facility])      Allergies  Allergen Reactions  . Wellbutrin [Bupropion] Hives  . Ciprofloxacin Hcl Hives and Rash    Hives/rash at injection site   . Tape Rash     Current Outpatient Medications  Medication Sig Dispense Refill  .  aspirin EC 81 MG EC tablet Take 1 tablet (81 mg total) by mouth daily. Swallow whole. 30 tablet 0  . atorvastatin (LIPITOR) 40 MG tablet Take 1 tablet (40 mg total) by mouth daily. 90 tablet 1  . clopidogrel (PLAVIX) 75 MG tablet TAKE 1 TABLET BY MOUTH ONCE DAILY. 30 tablet 6  . Continuous Blood Gluc Sensor (DEXCOM G6 SENSOR) MISC SMARTSIG:1 Each Topical Every 10 Days    . Continuous Blood Gluc Transmit (DEXCOM G6 TRANSMITTER) MISC     . fenofibrate 160 MG tablet Take 1 tablet (160 mg total) by mouth daily. 90 tablet 1  . ferrous sulfate 325 (65 FE) MG tablet Take 1 tablet (325 mg total) by mouth daily with breakfast. 30 tablet 1  . furosemide (LASIX) 40 MG  tablet Take 40 mg by mouth daily as needed for fluid.     Marland Kitchen glucagon (GLUCAGON EMERGENCY) 1 MG injection Inject 1 mg into the vein once as needed. 1 each 12  . insulin glargine (LANTUS SOLOSTAR) 100 UNIT/ML Solostar Pen Inject 40 Units into the skin in the morning. 15 mL 11  . isosorbide mononitrate (IMDUR) 30 MG 24 hr tablet Take 0.5 tablets (15 mg total) by mouth daily. 45 tablet 3  . levothyroxine (SYNTHROID) 175 MCG tablet Take 175 mcg by mouth daily before breakfast.   10  . metoprolol succinate (TOPROL-XL) 25 MG 24 hr tablet Take 0.5 tablets (12.5 mg total) by mouth in the morning and at bedtime. 90 tablet 1  . nitroGLYCERIN (NITROSTAT) 0.4 MG SL tablet Place 1 tablet (0.4 mg total) under the tongue every 5 (five) minutes as needed for chest pain. 25 tablet 3  . pantoprazole (PROTONIX) 40 MG tablet Take 40 mg by mouth daily as needed (for acid reflux).     . potassium chloride SA (K-DUR,KLOR-CON) 20 MEQ tablet Take 20 mEq by mouth daily as needed (When taking Lasix (Furosemide)).      No current facility-administered medications for this visit.     Past Surgical History:  Procedure Laterality Date  . Cardiac catherization    . CHOLECYSTECTOMY    . TUBAL LIGATION       Allergies  Allergen Reactions  . Wellbutrin [Bupropion] Hives  . Ciprofloxacin Hcl Hives and Rash    Hives/rash at injection site   . Tape Rash      Family History  Problem Relation Age of Onset  . Thyroid disease Mother   . Hypertension Mother   . Hyperlipidemia Mother   . CAD Mother   . CVA Mother   . Hyperlipidemia Father   . Hypertension Father   . CAD Father   . Breast cancer Paternal Grandmother 69  . Cancer Paternal Grandmother        lung     Social History Ms. Langill reports that she has quit smoking. Her smoking use included cigarettes. She smoked 1.00 pack per day. She has never used smokeless tobacco. Ms. Denbow reports no history of alcohol use.   Review of Systems CONSTITUTIONAL:  No weight loss, fever, chills, weakness or fatigue.  HEENT: Eyes: No visual loss, blurred vision, double vision or yellow sclerae.No hearing loss, sneezing, congestion, runny nose or sore throat.  SKIN: No rash or itching.  CARDIOVASCULAR: per hpi RESPIRATORY: No shortness of breath, cough or sputum.  GASTROINTESTINAL: No anorexia, nausea, vomiting or diarrhea. No abdominal pain or blood.  GENITOURINARY: No burning on urination, no polyuria NEUROLOGICAL: No headache, dizziness, syncope, paralysis, ataxia, numbness or tingling  in the extremities. No change in bowel or bladder control.  MUSCULOSKELETAL: No muscle, back pain, joint pain or stiffness.  LYMPHATICS: No enlarged nodes. No history of splenectomy.  PSYCHIATRIC: No history of depression or anxiety.  ENDOCRINOLOGIC: No reports of sweating, cold or heat intolerance. No polyuria or polydipsia.  Marland Kitchen   Physical Examination Today's Vitals   08/25/20 1035  BP: (!) 160/102  Pulse: 93  SpO2: 98%  Weight: 216 lb 12.8 oz (98.3 kg)  Height: '5\' 5"'$  (1.651 m)   Body mass index is 36.08 kg/m.  Gen: resting comfortably, no acute distress HEENT: no scleral icterus, pupils equal round and reactive, no palptable cervical adenopathy,  CV: RRR, no m/r/g, no jvd Resp: Clear to auscultation bilaterally GI: abdomen is soft, non-tender, non-distended, normal bowel sounds, no hepatosplenomegaly MSK: extremities are warm, no edema.  Skin: warm, no rash Neuro:  no focal deficits Psych: appropriate affect   Diagnostic Studies 12/2019 echo IMPRESSIONS    1. Left ventricular ejection fraction, by estimation, is 55 to 60%. The  left ventricle has normal function. The left ventricle demonstrates  regional wall motion abnormalities (see scoring diagram/findings for  description). There is mild left ventricular  hypertrophy. Left ventricular diastolic parameters were normal. There is  moderate hypokinesis of the left ventricular, apical. There is  sluggish  apical flow without clear formed thrombus.  2. Right ventricular systolic function is normal. The right ventricular  size is normal. There is mildly elevated pulmonary artery systolic  pressure.  3. The mitral valve is normal in structure. Trivial mitral valve  regurgitation. No evidence of mitral stenosis.  4. The inferior vena cava is normal in size with greater than 50%  respiratory variability, suggesting right atrial pressure of 3 mmHg.  5. The aortic valve is tricuspid. Aortic valve regurgitation is not  visualized. No aortic stenosis is present.    02/2020 nuclear stress  No diagnostic ST segment changes to indicate ischemia.  Large, severe intensity, periapical defect extending into the anterolateral wall and inferior septum that is largely fixed and consistent with infarct scar. There is a small region of apical anterolateral reversibility indicating a mild ischemic territory.  This is a high risk study based on defect size although region is most consistent with scar and a mild region of peri-infarct ischemia.  Nuclear stress EF: 48%. Large area of periapical akinesis.   Jan 2022 echo IMPRESSIONS    1. The mid to distal anteroseptal wall is akinetic. The apex is akinetic  with some portions dyskinetic, there is no signs of apical thrombus with  use of echocontrast. The mid to distal inferoseptal wall is akinetic. Marland Kitchen  Left ventricular ejection fraction,  by estimation, is 35 to 40%. The left ventricle has moderately decreased  function. Left ventricular diastolic parameters are consistent with Grade  III diastolic dysfunction (restrictive). Elevated left atrial pressure.  2. Right ventricular systolic function is normal. The right ventricular  size is normal.  3. Left atrial size was mildly dilated.  4. The mitral valve is normal in structure. Mild mitral valve  regurgitation. No evidence of mitral stenosis.  5. The inferior vena cava is normal  in size with greater than 50%  respiratory variability, suggesting right atrial pressure of 3 mmHg.  6. Limited echo to evaluate LV function and wall motion.      Assessment and Plan  1. CAD - distal/small vessel disease by 2015 cath in [facility] - with recent echo and nuclear findings would suggest she  had an apical infarct -  With her renal dysfunction we have not pursued cath.  -no recent symptoms, continue current meds  2. Hyperlipidemia - at goal, continue current meds  3. 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  -prior side effects on higerh beta blocker dosing, have not started - avoid ACE/ARB/ARNI/aldactone given renal function. GFR borderline for considering farxiga/jardiance, monitor at this time.   4. HTN - above goal today, home bp's better but still not at goal. With her CVA, CKD, and CHF needs optimal bp control - start hydral 12.'5mg'$  tid (conservaitve as prior issues with low bps in the past), can titrate up as needed to keep bps <130/80      Arnoldo Lenis, M.D.

## 2020-09-01 ENCOUNTER — Other Ambulatory Visit: Payer: Self-pay

## 2020-09-01 ENCOUNTER — Ambulatory Visit (INDEPENDENT_AMBULATORY_CARE_PROVIDER_SITE_OTHER): Payer: BLUE CROSS/BLUE SHIELD | Admitting: Obstetrics & Gynecology

## 2020-09-01 ENCOUNTER — Encounter: Payer: Self-pay | Admitting: Obstetrics & Gynecology

## 2020-09-01 VITALS — BP 128/87 | HR 96 | Ht 65.0 in | Wt 220.5 lb

## 2020-09-01 DIAGNOSIS — Z30430 Encounter for insertion of intrauterine contraceptive device: Secondary | ICD-10-CM

## 2020-09-01 DIAGNOSIS — D500 Iron deficiency anemia secondary to blood loss (chronic): Secondary | ICD-10-CM

## 2020-09-01 DIAGNOSIS — N920 Excessive and frequent menstruation with regular cycle: Secondary | ICD-10-CM

## 2020-09-01 LAB — POCT URINE PREGNANCY: Preg Test, Ur: NEGATIVE

## 2020-09-01 MED ORDER — LEVONORGESTREL 20 MCG/DAY IU IUD
1.0000 | INTRAUTERINE_SYSTEM | Freq: Once | INTRAUTERINE | Status: AC
  Administered 2020-09-01: 1 via INTRAUTERINE

## 2020-09-01 NOTE — Progress Notes (Signed)
IUD Insertion Procedure Note  Pre-operative Diagnosis: menorrhagia with anemia                                              On Plavix  Post-operative Diagnosis: same  Indications: menorrhagia  Procedure Details  Urine pregnancy test was done today and result was negative.  The risks (including infection, bleeding, pain, and uterine perforation) and benefits of the procedure were explained to the patient and Written informed consent was obtained.    Cervix cleansed with Betadine. Uterus sounded to 8 cm. IUD inserted without difficulty. String visible and trimmed. Patient tolerated procedure well.  IUD Information: Mirena, Lot # [id number], Expiration date 2024 [place].  Condition: Stable  Complications: None  Plan:  The patient was advised to call for any fever or for prolonged or severe pain or bleeding. She was advised to use OTC analgesics as needed for mild to moderate pain.   Attending Physician Documentation: I placed the IUD without difficulty

## 2020-09-16 ENCOUNTER — Encounter: Payer: Self-pay | Admitting: Neurology

## 2020-09-16 ENCOUNTER — Ambulatory Visit (INDEPENDENT_AMBULATORY_CARE_PROVIDER_SITE_OTHER): Payer: BLUE CROSS/BLUE SHIELD | Admitting: Neurology

## 2020-09-16 VITALS — BP 112/83 | HR 73 | Ht 65.0 in | Wt 215.2 lb

## 2020-09-16 DIAGNOSIS — I6930 Unspecified sequelae of cerebral infarction: Secondary | ICD-10-CM

## 2020-09-16 DIAGNOSIS — R200 Anesthesia of skin: Secondary | ICD-10-CM | POA: Diagnosis not present

## 2020-09-16 DIAGNOSIS — I639 Cerebral infarction, unspecified: Secondary | ICD-10-CM

## 2020-09-16 DIAGNOSIS — R202 Paresthesia of skin: Secondary | ICD-10-CM | POA: Diagnosis not present

## 2020-09-16 NOTE — Patient Instructions (Signed)
I had a long d/w patient and her husband about her recent lacunar stroke, risk for recurrent stroke/TIAs, personally independently reviewed imaging studies and stroke evaluation results and answered questions.Continue Plavix 75 mg daily alone and discontinue aspirin due to increased bruising for secondary stroke prevention and maintain strict control of hypertension with blood pressure goal below 130/90, diabetes with hemoglobin A1c goal below 6.5% and lipids with LDL cholesterol goal below 70 mg/dL. I also advised the patient to eat a healthy diet with plenty of whole grains, cereals, fruits and vegetables, exercise regularly and maintain ideal body weight. Followup in the future with my nurse practitioner Janett Billow in 3 months or call earlier if necessary.  Stroke Prevention Some medical conditions and behaviors are associated with a higher chance of having a stroke. You can help prevent a stroke by making nutrition, lifestyle, and other changes, including managing any medical conditions you may have. What nutrition changes can be made?  Eat healthy foods. You can do this by: ? Choosing foods high in fiber, such as fresh fruits and vegetables and whole grains. ? Eating at least 5 or more servings of fruits and vegetables a day. Try to fill half of your plate at each meal with fruits and vegetables. ? Choosing lean protein foods, such as lean cuts of meat, poultry without skin, fish, tofu, beans, and nuts. ? Eating low-fat dairy products. ? Avoiding foods that are high in salt (sodium). This can help lower blood pressure. ? Avoiding foods that have saturated fat, trans fat, and cholesterol. This can help prevent high cholesterol. ? Avoiding processed and premade foods.  Follow your health care provider's specific guidelines for losing weight, controlling high blood pressure (hypertension), lowering high cholesterol, and managing diabetes. These may include: ? Reducing your daily calorie  intake. ? Limiting your daily sodium intake to 1,500 milligrams (mg). ? Using only healthy fats for cooking, such as olive oil, canola oil, or sunflower oil. ? Counting your daily carbohydrate intake.   What lifestyle changes can be made?  Maintain a healthy weight. Talk to your health care provider about your ideal weight.  Get at least 30 minutes of moderate physical activity at least 5 days a week. Moderate activity includes brisk walking, biking, and swimming.  Do not use any products that contain nicotine or tobacco, such as cigarettes and e-cigarettes. If you need help quitting, ask your health care provider. It may also be helpful to avoid exposure to secondhand smoke.  Limit alcohol intake to no more than 1 drink a day for nonpregnant women and 2 drinks a day for men. One drink equals 12 oz of beer, 5 oz of wine, or 1 oz of hard liquor.  Stop any illegal drug use.  Avoid taking birth control pills. Talk to your health care provider about the risks of taking birth control pills if: ? You are over 70 years old. ? You smoke. ? You get migraines. ? You have ever had a blood clot. What other changes can be made?  Manage your cholesterol levels. ? Eating a healthy diet is important for preventing high cholesterol. If cholesterol cannot be managed through diet alone, you may also need to take medicines. ? Take any prescribed medicines to control your cholesterol as told by your health care provider.  Manage your diabetes. ? Eating a healthy diet and exercising regularly are important parts of managing your blood sugar. If your blood sugar cannot be managed through diet and exercise, you may need to  take medicines. ? Take any prescribed medicines to control your diabetes as told by your health care provider.  Control your hypertension. ? To reduce your risk of stroke, try to keep your blood pressure below 130/80. ? Eating a healthy diet and exercising regularly are an important part  of controlling your blood pressure. If your blood pressure cannot be managed through diet and exercise, you may need to take medicines. ? Take any prescribed medicines to control hypertension as told by your health care provider. ? Ask your health care provider if you should monitor your blood pressure at home. ? Have your blood pressure checked every year, even if your blood pressure is normal. Blood pressure increases with age and some medical conditions.  Get evaluated for sleep disorders (sleep apnea). Talk to your health care provider about getting a sleep evaluation if you snore a lot or have excessive sleepiness.  Take over-the-counter and prescription medicines only as told by your health care provider. Aspirin or blood thinners (antiplatelets or anticoagulants) may be recommended to reduce your risk of forming blood clots that can lead to stroke.  Make sure that any other medical conditions you have, such as atrial fibrillation or atherosclerosis, are managed. What are the warning signs of a stroke? The warning signs of a stroke can be easily remembered as BEFAST.  B is for balance. Signs include: ? Dizziness. ? Loss of balance or coordination. ? Sudden trouble walking.  E is for eyes. Signs include: ? A sudden change in vision. ? Trouble seeing.  F is for face. Signs include: ? Sudden weakness or numbness of the face. ? The face or eyelid drooping to one side.  A is for arms. Signs include: ? Sudden weakness or numbness of the arm, usually on one side of the body.  S is for speech. Signs include: ? Trouble speaking (aphasia). ? Trouble understanding.  T is for time. ? These symptoms may represent a serious problem that is an emergency. Do not wait to see if the symptoms will go away. Get medical help right away. Call your local emergency services (911 in the [place] Do not drive yourself to the hospital.  Other signs of stroke may include: ? A sudden, severe headache  with no known cause. ? Nausea or vomiting. ? Seizure. Where to find more information For more information, visit:  American Stroke Association: www.strokeassociation.org  National Stroke Association: www.stroke.org Summary  You can prevent a stroke by eating healthy, exercising, not smoking, limiting alcohol intake, and managing any medical conditions you may have.  Do not use any products that contain nicotine or tobacco, such as cigarettes and e-cigarettes. If you need help quitting, ask your health care provider. It may also be helpful to avoid exposure to secondhand smoke.  Remember BEFAST for warning signs of stroke. Get help right away if you or a loved one has any of these signs. This information is not intended to replace advice given to you by your health care provider. Make sure you discuss any questions you have with your health care provider. Document Revised: 04/01/2017 Document Reviewed: 05/25/2016 Elsevier Patient Education  2021 [employer].

## 2020-09-16 NOTE — Progress Notes (Signed)
[facility] [address] [place]. [place] [address] [phone]       OFFICE CONSULT NOTE  [name] Date of Birth:  May 12, 1980 Medical Record Number:  [id number]   Referring MD: Bernerd Pho, PA-C  Reason for Referral: Stroke  HPI: Lauren [name] is a 40 year old Caucasian lady seen today for initial office consultation visit for stroke.  History is obtained from the patient and her husband and review of electronic medical records and I personally reviewed pertinent imaging films in PACS.  She has past medical history of coronary artery disease, congestive heart failure, diabetes, kidney disease.  Patient presented on 12/31/2019 with sudden onset of left face and body numbness as well as imbalance, double vision and nausea.  She was admitted to [facility] and MRI scan of the brain showed a right midbrain lacunar stroke.  MRA of the brain showed no large vessel stenosis or occlusion.  Carotid ultrasound showed no significant extracranial stenosis.  She was discharged on aspirin and Plavix for 30 days followed by aspirin alone but subsequently was admitted for an MI.  Subsequently 2D echo on 05/26/2020 showed anterior septal akinesia with decreased ejection fraction of 35 to 40% and she had an MI.  Last hemoglobin A1c on 06/13/2020 was elevated at 9.5 and LDL cholesterol was optimal at 55 mg percent.  Patient states her imbalance and double vision have improved.  She still has some mild residual paresthesias in the left left face particular in the lower half chest persisted.  She has not had any other recurrent stroke or TIA symptoms.  She has a strong family history of MI in the 29s in the family and her grandfather died in his early 95s from MI.  Patient also used to be a heavy smoker but she quit a year ago.  Her mother also had a stroke in her early 26s.  Patient is currently working as a Quarry manager and plans to go to school for a bachelor's to apply for  medical  assistant program..  She is currently on aspirin and Plavix and tolerating it well but does complain of increased bruising.  She states her blood pressure is under good control and today it is 112/83.  She states her sugars are still not optimally controlled.  She does complain of paresthesias in the feet and does have diabetic neuropathy which is also longstanding.  ROS:   14 system review of systems is positive for numbness, nausea, ataxia, double vision, imbalance and all other systems negative  PMH:  Past Medical History:  Diagnosis Date  . Anemia   . CAD (coronary artery disease)    a. s/p cath in 03/2014 showing 30% mid-LAD, moderate to severe disease along small D1, patent LCx, moderate to severe distal OM2 stenosis and moderate diffuse diease along RCA not amenable to PCI  . CHF (congestive heart failure) ([place])    a. EF 55-60% in 12/2019 b. EF at 35-40% by echo in 05/2020  . Diabetes mellitus without complication ([place])   . Myocardial infarction ([place])   . Renal disorder    stage 3 kidney disease  . Stroke [facility])     Social History:  Social History   Socioeconomic History  . Marital status: Married    Spouse name: [name]  . Number of children: Not on file  . Years of education: Not on file  . Highest education level: Not on file  Occupational History    Comment: Full time  Tobacco  Use  . Smoking status: Former Smoker    Packs/day: 1.00    Types: Cigarettes  . Smokeless tobacco: Never Used  Vaping Use  . Vaping Use: Never used  Substance and Sexual Activity  . Alcohol use: No    Alcohol/week: 0.0 standard drinks  . Drug use: No  . Sexual activity: Yes    Birth control/protection: Surgical    Comment: tubal   Other Topics Concern  . Not on file  Social History Narrative   Lives with husband and kids   Right handed   Drinks 9+ cups caffeine daily   Social Determinants of Health   Financial Resource Strain: Medium Risk  . Difficulty of Paying Living Expenses:  Somewhat hard  Food Insecurity: Food Insecurity Present  . Worried About Charity fundraiser in the Last Year: Sometimes true  . Ran Out of Food in the Last Year: Sometimes true  Transportation Needs: No Transportation Needs  . Lack of Transportation (Medical): No  . Lack of Transportation (Non-Medical): No  Physical Activity: Insufficiently Active  . Days of Exercise per Week: 1 day  . Minutes of Exercise per Session: 30 min  Stress: No Stress Concern Present  . Feeling of Stress : Not at all  Social Connections: Moderately Integrated  . Frequency of Communication with Friends and Family: More than three times a week  . Frequency of Social Gatherings with Friends and Family: Three times a week  . Attends Religious Services: Never  . Active Member of Clubs or Organizations: Yes  . Attends Archivist Meetings: More than 4 times per year  . Marital Status: Married  Human resources officer Violence: Not At Risk  . Fear of Current or Ex-Partner: No  . Emotionally Abused: No  . Physically Abused: No  . Sexually Abused: No    Medications:   Current Outpatient Medications on File Prior to Visit  Medication Sig Dispense Refill  . atorvastatin (LIPITOR) 40 MG tablet Take 1 tablet (40 mg total) by mouth daily. 90 tablet 1  . clopidogrel (PLAVIX) 75 MG tablet TAKE 1 TABLET BY MOUTH ONCE DAILY. 30 tablet 6  . Continuous Blood Gluc Sensor (DEXCOM G6 SENSOR) MISC SMARTSIG:1 Each Topical Every 10 Days    . Continuous Blood Gluc Transmit (DEXCOM G6 TRANSMITTER) MISC     . fenofibrate 160 MG tablet Take 1 tablet (160 mg total) by mouth daily. 90 tablet 1  . ferrous sulfate 325 (65 FE) MG tablet Take 1 tablet (325 mg total) by mouth daily with breakfast. 30 tablet 1  . furosemide (LASIX) 40 MG tablet Take 40 mg by mouth daily as needed for fluid.     Marland Kitchen glucagon (GLUCAGON EMERGENCY) 1 MG injection Inject 1 mg into the vein once as needed. 1 each 12  . hydrALAZINE (APRESOLINE) 25 MG tablet Take  0.5 tablets (12.5 mg total) by mouth 3 (three) times daily. 135 tablet 1  . insulin glargine (LANTUS SOLOSTAR) 100 UNIT/ML Solostar Pen Inject 40 Units into the skin in the morning. 15 mL 11  . isosorbide mononitrate (IMDUR) 30 MG 24 hr tablet Take 0.5 tablets (15 mg total) by mouth daily. 45 tablet 3  . levothyroxine (SYNTHROID) 175 MCG tablet Take 175 mcg by mouth daily before breakfast.   10  . metoprolol succinate (TOPROL-XL) 25 MG 24 hr tablet Take 0.5 tablets (12.5 mg total) by mouth in the morning and at bedtime. 90 tablet 1  . nitroGLYCERIN (NITROSTAT) 0.4 MG SL tablet Place  0.4 mg under the tongue every 5 (five) minutes x 3 doses as needed for chest pain.    . pantoprazole (PROTONIX) 40 MG tablet Take 40 mg by mouth daily as needed (for acid reflux).     . potassium chloride SA (K-DUR,KLOR-CON) 20 MEQ tablet Take 20 mEq by mouth daily as needed (When taking Lasix (Furosemide)).      No current facility-administered medications on file prior to visit.    Allergies:   Allergies  Allergen Reactions  . Wellbutrin [Bupropion] Hives  . Ciprofloxacin Hcl Hives and Rash    Hives/rash at injection site   . Tape Rash    Physical Exam General: Obese middle-aged Caucasian lady, seated, in no evident distress Head: head normocephalic and atraumatic.   Neck: supple with no carotid or supraclavicular bruits Cardiovascular: regular rate and rhythm, no murmurs Musculoskeletal: no deformity Skin:  no rash/petichiae Vascular:  Normal pulses all extremities  Neurologic Exam Mental Status: Awake and fully alert. Oriented to place and time. Recent and remote memory intact. Attention span, concentration and fund of knowledge appropriate. Mood and affect appropriate.  Cranial Nerves: Fundoscopic exam reveals sharp disc margins. Pupils equal, briskly reactive to light. Extraocular movements full without nystagmus. Visual fields full to confrontation. Hearing intact. Facial sensation intact. Face,  tongue, palate moves normally and symmetrically.  Motor: Normal bulk and tone. Normal strength in all tested extremity muscles. Sensory.:  Diminished touch , pinprick , position and vibratory sensation from the ankle down..  She also has left face paresthesias. Coordination: Rapid alternating movements normal in all extremities. Finger-to-nose and heel-to-shin performed accurately bilaterally. Gait and Station: Arises from chair without difficulty. Stance is normal. Gait demonstrates normal stride length and balance . Able to heel, toe and tandem walk without difficulty.  Reflexes: 1+ and symmetric except both ankle jerks are absent.. Toes downgoing.   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  Vascular risk factors of diabetes, hypertension, hyperlipidemia, coronary artery disease, CHF, ex smoking , obesity and strong family history of premature coronary artery disease and strokes at a young age.     PLAN: I had a long d/w patient and her husband about her recent lacunar stroke, risk for recurrent stroke/TIAs, personally independently reviewed imaging studies and stroke evaluation results and answered questions.Continue Plavix 75 mg daily alone and discontinue aspirin due to increased bruising for secondary stroke prevention and maintain strict control of hypertension with blood pressure goal below 130/90, diabetes with hemoglobin A1c goal below 6.5% and lipids with LDL cholesterol goal below 70 mg/dL. I also advised the patient to eat a healthy diet with plenty of whole grains, cereals, fruits and vegetables, exercise regularly and maintain ideal body weight. Followup in the future with my nurse practitioner Janett Billow in 3 months or call earlier if necessary.  Greater than 50% time during this 45-minute consultation visit were spent on counseling and coordination of care about a lacunar stroke  and answering questions about stroke prevention and treatment. Antony Contras, MD Note: This document was prepared with digital dictation and possible smart phrase technology. Any transcriptional errors that result from this process are unintentional.

## 2020-09-24 ENCOUNTER — Other Ambulatory Visit: Payer: Self-pay | Admitting: Family Medicine

## 2020-09-25 ENCOUNTER — Telehealth: Payer: Self-pay | Admitting: Obstetrics & Gynecology

## 2020-09-25 NOTE — Telephone Encounter (Signed)
Pt states she's been bleeding since 5/2 when we placed the IUD Not heavy or clotting but steady - will get dark brown like it's gonna stop then goes back to red Pt states she's anemic & is worried  Please advise & notify pt

## 2020-10-01 ENCOUNTER — Other Ambulatory Visit: Payer: Self-pay | Admitting: Family Medicine

## 2020-10-29 DIAGNOSIS — B354 Tinea corporis: Secondary | ICD-10-CM | POA: Diagnosis not present

## 2020-11-19 ENCOUNTER — Ambulatory Visit (INDEPENDENT_AMBULATORY_CARE_PROVIDER_SITE_OTHER): Payer: BLUE CROSS/BLUE SHIELD | Admitting: Cardiology

## 2020-11-19 ENCOUNTER — Encounter: Payer: Self-pay | Admitting: Cardiology

## 2020-11-19 VITALS — BP 154/88 | HR 88 | Ht 65.0 in | Wt 218.0 lb

## 2020-11-19 DIAGNOSIS — I10 Essential (primary) hypertension: Secondary | ICD-10-CM

## 2020-11-19 DIAGNOSIS — I5022 Chronic systolic (congestive) heart failure: Secondary | ICD-10-CM

## 2020-11-19 MED ORDER — HYDRALAZINE HCL 25 MG PO TABS: 25.0000 mg | ORAL_TABLET | Freq: Three times a day (TID) | ORAL | 3 refills | Status: DC

## 2020-11-19 NOTE — Progress Notes (Signed)
Clinical Summary Lauren [name] is a 40 y.o.femaleseen today for follow up of the following medical problems. This is a focused visit on her history of chronic systolic HF and HTN, for more detailed history please see prior note.         1. Chronic systolic HF Jan [id number] echo: LVEF 35-40%, grade III dd, apex akinetic - imdur lowered to '15mg'$  daily due to headaches  - side effects of dizziness on higher beta blocker dosing, titrate slowly - avoiding ACe/ARB/ARNI/aldactone given poor renal function. Last GFR 33   - last visit started hydraalzine 12.'5mg'$  tid. Tolerating weithout troubles - no recent SOB/DOE.         2. HTN - remains compliant with meds, bp's were above goal last visit and we started hydralazien 12.'5mg'$  tid.     Works as Training and development officer In [employer], upcoming state certificiation Working on Buyer, retail in healthcare adminis  Past Medical History:  Diagnosis Date   Anemia    CAD (coronary artery disease)    a. s/p cath in 03/2014 showing 30% mid-LAD, moderate to severe disease along small D1, patent LCx, moderate to severe distal OM2 stenosis and moderate diffuse diease along RCA not amenable to PCI   CHF (congestive heart failure) ([place])    a. EF 55-60% in 12/2019 b. EF at 35-40% by echo in 05/2020   Diabetes mellitus without complication ([facility])    Myocardial infarction ([place])    Renal disorder    stage 3 kidney disease   Stroke ([facility])      Allergies  Allergen Reactions   Wellbutrin [Bupropion] Hives   Ciprofloxacin Hcl Hives and Rash    Hives/rash at injection site    Tape Rash     Current Outpatient Medications  Medication Sig Dispense Refill   atorvastatin (LIPITOR) 40 MG tablet TAKE 1 TABLET BY MOUTH ONCE DAILY. 90 tablet 3   clopidogrel (PLAVIX) 75 MG tablet TAKE 1 TABLET BY MOUTH ONCE DAILY. 30 tablet 6   Continuous Blood Gluc Sensor (DEXCOM G6 SENSOR) MISC SMARTSIG:1 Each Topical Every 10 Days     Continuous Blood Gluc Transmit (DEXCOM G6 TRANSMITTER)  MISC      fenofibrate 160 MG tablet TAKE 1 TABLET BY MOUTH ONCE DAILY. 90 tablet 3   ferrous sulfate 325 (65 FE) MG tablet Take 1 tablet (325 mg total) by mouth daily with breakfast. 30 tablet 1   furosemide (LASIX) 40 MG tablet Take 40 mg by mouth daily as needed for fluid.      glucagon (GLUCAGON EMERGENCY) 1 MG injection Inject 1 mg into the vein once as needed. 1 each 12   hydrALAZINE (APRESOLINE) 25 MG tablet Take 0.5 tablets (12.5 mg total) by mouth 3 (three) times daily. 135 tablet 1   insulin glargine (LANTUS SOLOSTAR) 100 UNIT/ML Solostar Pen Inject 40 Units into the skin in the morning. 15 mL 11   isosorbide mononitrate (IMDUR) 30 MG 24 hr tablet Take 0.5 tablets (15 mg total) by mouth daily. 45 tablet 3   levothyroxine (SYNTHROID) 175 MCG tablet Take 175 mcg by mouth daily before breakfast.   10   metoprolol succinate (TOPROL-XL) 25 MG 24 hr tablet Take 0.5 tablets (12.5 mg total) by mouth in the morning and at bedtime. 90 tablet 1   nitroGLYCERIN (NITROSTAT) 0.4 MG SL tablet Place 0.4 mg under the tongue every 5 (five) minutes x 3 doses as needed for chest pain.     pantoprazole (PROTONIX) 40 MG tablet Take  40 mg by mouth daily as needed (for acid reflux).      potassium chloride SA (K-DUR,KLOR-CON) 20 MEQ tablet Take 20 mEq by mouth daily as needed (When taking Lasix (Furosemide)).      No current facility-administered medications for this visit.     Past Surgical History:  Procedure Laterality Date   Cardiac catherization     CHOLECYSTECTOMY     TUBAL LIGATION       Allergies  Allergen Reactions   Wellbutrin [Bupropion] Hives   Ciprofloxacin Hcl Hives and Rash    Hives/rash at injection site    Tape Rash      Family History  Problem Relation Age of Onset   Thyroid disease Mother    Hypertension Mother    Hyperlipidemia Mother    CAD Mother    CVA Mother    Hyperlipidemia Father    Hypertension Father    CAD Father    Breast cancer Paternal Grandmother 64    Cancer Paternal Grandmother        lung     Social History Lauren [name] reports that she has quit smoking. Her smoking use included cigarettes. She smoked an average of 1 pack per day. She has never used smokeless tobacco. Lauren [name] reports no history of alcohol use.   Review of Systems CONSTITUTIONAL: No weight loss, fever, chills, weakness or fatigue.  HEENT: Eyes: No visual loss, blurred vision, double vision or yellow sclerae.No hearing loss, sneezing, congestion, runny nose or sore throat.  SKIN: No rash or itching.  CARDIOVASCULAR: per hpi RESPIRATORY: No shortness of breath, cough or sputum.  GASTROINTESTINAL: No anorexia, nausea, vomiting or diarrhea. No abdominal pain or blood.  GENITOURINARY: No burning on urination, no polyuria NEUROLOGICAL: No headache, dizziness, syncope, paralysis, ataxia, numbness or tingling in the extremities. No change in bowel or bladder control.  MUSCULOSKELETAL: No muscle, back pain, joint pain or stiffness.  LYMPHATICS: No enlarged nodes. No history of splenectomy.  PSYCHIATRIC: No history of depression or anxiety.  ENDOCRINOLOGIC: No reports of sweating, cold or heat intolerance. No polyuria or polydipsia.  Marland Kitchen   Physical Examination Today's Vitals   11/19/20 0823  BP: (!) 154/88  Pulse: 88  SpO2: 98%  Weight: 218 lb (98.9 kg)  Height: '5\' 5"'$  (1.651 m)   Body mass index is 36.28 kg/m.  Gen: resting comfortably, no acute distress HEENT: no scleral icterus, pupils equal round and reactive, no palptable cervical adenopathy,  CV: RRR, no m/r/gl no jvd Resp: Clear to auscultation bilaterally GI: abdomen is soft, non-tender, non-distended, normal bowel sounds, no hepatosplenomegaly MSK: extremities are warm, no edema.  Skin: warm, no rash Neuro:  no focal deficits Psych: appropriate affect   Diagnostic Studies 12/2019 echo IMPRESSIONS     1. Left ventricular ejection fraction, by estimation, is 55 to 60%. The  left ventricle has  normal function. The left ventricle demonstrates  regional wall motion abnormalities (see scoring diagram/findings for  description). There is mild left ventricular   hypertrophy. Left ventricular diastolic parameters were normal. There is  moderate hypokinesis of the left ventricular, apical. There is sluggish  apical flow without clear formed thrombus.   2. Right ventricular systolic function is normal. The right ventricular  size is normal. There is mildly elevated pulmonary artery systolic  pressure.   3. The mitral valve is normal in structure. Trivial mitral valve  regurgitation. No evidence of mitral stenosis.   4. The inferior vena cava is normal in size with greater  than 50%  respiratory variability, suggesting right atrial pressure of 3 mmHg.   5. The aortic valve is tricuspid. Aortic valve regurgitation is not  visualized. No aortic stenosis is present.     02/2020 nuclear stress No diagnostic ST segment changes to indicate ischemia. Large, severe intensity, periapical defect extending into the anterolateral wall and inferior septum that is largely fixed and consistent with infarct scar. There is a small region of apical anterolateral reversibility indicating a mild ischemic territory. This is a high risk study based on defect size although region is most consistent with scar and a mild region of peri-infarct ischemia. Nuclear stress EF: 48%. Large area of periapical akinesis.     Jan 2022 echo IMPRESSIONS     1. The mid to distal anteroseptal wall is akinetic. The apex is akinetic  with some portions dyskinetic, there is no signs of apical thrombus with  use of echocontrast. The mid to distal inferoseptal wall is akinetic. Marland Kitchen  Left ventricular ejection fraction,   by estimation, is 35 to 40%. The left ventricle has moderately decreased  function. Left ventricular diastolic parameters are consistent with Grade  III diastolic dysfunction (restrictive). Elevated left atrial  pressure.   2. Right ventricular systolic function is normal. The right ventricular  size is normal.   3. Left atrial size was mildly dilated.   4. The mitral valve is normal in structure. Mild mitral valve  regurgitation. No evidence of mitral stenosis.   5. The inferior vena cava is normal in size with greater than 50%  respiratory variability, suggesting right atrial pressure of 3 mmHg.   6. Limited echo to evaluate LV function and wall motion.       Assessment and Plan  1.. 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 -prior side effects on higerh beta blocker dosing, have not started - avoid ACE/ARB/ARNI/aldactone given renal function. GFR borderline for considering farxiga/jardiance - increase hydralazine to '25mg'$  tid today   2. HTN - With her CVA, CKD, and CHF needs optimal bp control - we will increase her hydralazine to '25mg'$  tid      Arnoldo Lenis, M.D.

## 2020-11-19 NOTE — Patient Instructions (Addendum)
Medication Instructions:  Increase Hydralazine to '25mg'$  three times per day. New prescription sent to [facility] today.  Continue all other medications.    Labwork: none  Testing/Procedures: none  Follow-Up: 3 months - scheduled - if date & time does not work for you, please call the office to change.    Any Other Special Instructions Will Be Listed Below (If Applicable).  If you need a refill on your cardiac medications before your next appointment, please call your pharmacy.

## 2020-11-26 DIAGNOSIS — E039 Hypothyroidism, unspecified: Secondary | ICD-10-CM | POA: Diagnosis not present

## 2020-11-26 DIAGNOSIS — I10 Essential (primary) hypertension: Secondary | ICD-10-CM | POA: Diagnosis not present

## 2020-11-26 DIAGNOSIS — L989 Disorder of the skin and subcutaneous tissue, unspecified: Secondary | ICD-10-CM | POA: Diagnosis not present

## 2020-11-26 DIAGNOSIS — E1021 Type 1 diabetes mellitus with diabetic nephropathy: Secondary | ICD-10-CM | POA: Diagnosis not present

## 2020-12-18 ENCOUNTER — Ambulatory Visit: Payer: BLUE CROSS/BLUE SHIELD | Admitting: Adult Health

## 2021-01-19 ENCOUNTER — Other Ambulatory Visit: Payer: Self-pay | Admitting: Cardiology

## 2021-01-30 DIAGNOSIS — L02214 Cutaneous abscess of groin: Secondary | ICD-10-CM | POA: Diagnosis not present

## 2021-02-19 ENCOUNTER — Encounter: Payer: Self-pay | Admitting: Cardiology

## 2021-02-19 ENCOUNTER — Ambulatory Visit (INDEPENDENT_AMBULATORY_CARE_PROVIDER_SITE_OTHER): Payer: BLUE CROSS/BLUE SHIELD | Admitting: Cardiology

## 2021-02-19 ENCOUNTER — Encounter: Payer: Self-pay | Admitting: *Deleted

## 2021-02-19 ENCOUNTER — Other Ambulatory Visit: Payer: Self-pay

## 2021-02-19 VITALS — BP 130/84 | HR 90 | Ht 65.0 in | Wt 213.0 lb

## 2021-02-19 DIAGNOSIS — I5022 Chronic systolic (congestive) heart failure: Secondary | ICD-10-CM

## 2021-02-19 DIAGNOSIS — E782 Mixed hyperlipidemia: Secondary | ICD-10-CM

## 2021-02-19 DIAGNOSIS — I2510 Atherosclerotic heart disease of native coronary artery without angina pectoris: Secondary | ICD-10-CM | POA: Diagnosis not present

## 2021-02-19 DIAGNOSIS — I10 Essential (primary) hypertension: Secondary | ICD-10-CM | POA: Diagnosis not present

## 2021-02-19 MED ORDER — HYDRALAZINE HCL 25 MG PO TABS
37.5000 mg | ORAL_TABLET | Freq: Three times a day (TID) | ORAL | 5 refills | Status: DC
  Filled 2021-12-17: qty 135, 30d supply, fill #0

## 2021-02-19 NOTE — Progress Notes (Signed)
Clinical Summary [name] is a 40 y.o.female seen today for follow up of the following medical problems.   1. Chronic systolic HF Jan [id number] echo: LVEF 35-40%, grade III dd, apex akinetic - imdur lowered to '15mg'$  daily due to headaches  - side effects of dizziness on higher beta blocker dosing, titrate slowly - avoiding ACe/ARB/ARNI/aldactone given poor renal function. - headaches on higher imdur doses  - 03/2014 cath: LVEF >60%, mid LAD 30%, D1 mod to severe small vessel, distally pruned, distal 50-60% disease. LCX patent, OM2, mild disease with distal mod to severe, RCA diffuse moderate disease not ameanble to PCI - given renal function we have not repeated her cath despite drop in LVEF 02/2020 nuclear stress: large periapical defect consistent with scar, mild apical anterolateral ischemia.  Echo reviewed , LVEF 35-40% with apical akinesis. I reviewed the echo which I had read in 12/2019 and compared images side by side. 12/2019 LVEF likely an overestimate and closer to 40%, difficult assessment given the base and mid ventricle move well and main dysfunction is focused in the apex.    - no recent SOB/DOE, no LE edema - compliant ith meds. Has not had to take prn lasix in several months      2. HTN - she is compliant with meds      3. CAD/ Elevated troponin 03/2014 Records from [facility] - admitted with DKA, found to have elevated troponins - echo with normal LVEF 65-70% - during stress test had new LBBB, referred for cath - 03/2014 cath: LVEF >60%, mid LAD 30%, D1 mod to severe small vessel, distally pruned, distal 50-60% disease. LCX patent, OM2, mild disease with distal mod to severe, RCA diffuse moderate disease not ameanble to PCI    - admitted 01/2020 with CVA, iin this setting elevated trop to 413. EKG without acute ischemic changes - echo with some apical hypokinesis - workup deferred to allow her time to recover from stroke, also thought was this could be a stress induced  CM   02/2020 nuclear stress: large periapical defect consistent with scar, mild apical anterolateral ischemia.    Jan 2022 echo LVEF 35-40%, grade III dd, apex akinetic      - no recent chest pain - no SOB/DOE - no recent edema. Has not had to take lasix in several months.      3. CKD - followed by pcp - reports seeing neprhology some time ago but not recently     4. History of CVA - admitted 01/2020 with CVA, - on DAPT for prior CVA, not for a cardiac reason.  - now just on plavix per neuro   5. Anemia - Hgb was at 7.4 when checked in 06/2020.  - admit 06/2020 with chest pain, mild trop 200s. Anemia during admit. Thought to be demandi ischemia, managed medically - last Hgb was 12.1      6. DM2 - followed by pcp   7. 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 s/p cath in 03/2014 showing 30% mid-LAD, moderate to severe disease along small D1, patent LCx, moderate to severe distal OM2 stenosis and moderate diffuse diease along RCA  not amenable to PCI   CHF (congestive heart failure) ([place])    a. EF 55-60% in 12/2019 b. EF at 35-40% by echo in 05/2020   Diabetes mellitus without complication (HCC)    Myocardial infarction [facility])    Renal disorder    stage 3 kidney disease   Stroke (HCC)      Allergies  Allergen Reactions   Wellbutrin [Bupropion] Hives   Ciprofloxacin Hcl Hives and Rash    Hives/rash at injection site    Tape Rash     Current Outpatient Medications  Medication Sig Dispense Refill   atorvastatin (LIPITOR) 40 MG tablet TAKE 1 TABLET BY MOUTH ONCE DAILY. 90 tablet 3   clopidogrel (PLAVIX) 75 MG tablet TAKE 1 TABLET BY MOUTH ONCE DAILY. 30 tablet 6   Continuous Blood Gluc Sensor (DEXCOM G6  SENSOR) MISC SMARTSIG:1 Each Topical Every 10 Days     Continuous Blood Gluc Transmit (DEXCOM G6 TRANSMITTER) MISC      fenofibrate 160 MG tablet TAKE 1 TABLET BY MOUTH ONCE DAILY. 90 tablet 3   ferrous sulfate 325 (65 FE) MG tablet Take 1 tablet (325 mg total) by mouth daily with breakfast. 30 tablet 1   furosemide (LASIX) 40 MG tablet Take 40 mg by mouth daily as needed for fluid.      glucagon (GLUCAGON EMERGENCY) 1 MG injection Inject 1 mg into the vein once as needed. 1 each 12   hydrALAZINE (APRESOLINE) 25 MG tablet Take 1 tablet (25 mg total) by mouth 3 (three) times daily. 270 tablet 3   insulin glargine (LANTUS SOLOSTAR) 100 UNIT/ML Solostar Pen Inject 40 Units into the skin in the morning. 15 mL 11   isosorbide mononitrate (IMDUR) 30 MG 24 hr tablet Take 0.5 tablets (15 mg total) by mouth daily. 45 tablet 3   levothyroxine (SYNTHROID) 175 MCG tablet Take 175 mcg by mouth daily before breakfast.   10   metoprolol succinate (TOPROL-XL) 25 MG 24 hr tablet TAKE 1/2 A TABLET BY MOUTH IN THE MORNING AND AT BEDTIME. 90 tablet 1   nitroGLYCERIN (NITROSTAT) 0.4 MG SL tablet Place 0.4 mg under the tongue every 5 (five) minutes x 3 doses as needed for chest pain.     pantoprazole (PROTONIX) 40 MG tablet Take 40 mg by mouth daily as needed (for acid reflux).      potassium chloride SA (K-DUR,KLOR-CON) 20 MEQ tablet Take 20 mEq by mouth daily as needed (When taking Lasix (Furosemide)).      No current facility-administered medications for this visit.     Past Surgical History:  Procedure Laterality Date   Cardiac catherization     CHOLECYSTECTOMY     TUBAL LIGATION       Allergies  Allergen Reactions   Wellbutrin [Bupropion] Hives   Ciprofloxacin Hcl Hives and Rash    Hives/rash at injection site    Tape Rash      Family History  Problem Relation Age of Onset   Thyroid disease Mother    Hypertension Mother    Hyperlipidemia Mother    CAD Mother    CVA Mother    Hyperlipidemia  Father    Hypertension Father    CAD Father    Breast cancer Paternal Grandmother 64   Cancer Paternal Grandmother        lung     Social History Ms. Barua reports that she has quit smoking. Her smoking use included cigarettes. She smoked an average of 1 pack per day.  She has never used smokeless tobacco. Ms. Duree reports no history of alcohol use.   Review of Systems CONSTITUTIONAL: No weight loss, fever, chills, weakness or fatigue.  HEENT: Eyes: No visual loss, blurred vision, double vision or yellow sclerae.No hearing loss, sneezing, congestion, runny nose or sore throat.  SKIN: No rash or itching.  CARDIOVASCULAR: per hpi RESPIRATORY: No shortness of breath, cough or sputum.  GASTROINTESTINAL: No anorexia, nausea, vomiting or diarrhea. No abdominal pain or blood.  GENITOURINARY: No burning on urination, no polyuria NEUROLOGICAL: No headache, dizziness, syncope, paralysis, ataxia, numbness or tingling in the extremities. No change in bowel or bladder control.  MUSCULOSKELETAL: No muscle, back pain, joint pain or stiffness.  LYMPHATICS: No enlarged nodes. No history of splenectomy.  PSYCHIATRIC: No history of depression or anxiety.  ENDOCRINOLOGIC: No reports of sweating, cold or heat intolerance. No polyuria or polydipsia.  Marland Kitchen   Physical Examination Today's Vitals   02/19/21 1002  BP: 130/84  Pulse: 90  SpO2: 98%  Weight: 213 lb (96.6 kg)  Height: '5\' 5"'$  (1.651 m)   Body mass index is 35.45 kg/m.  Gen: resting comfortably, no acute distress HEENT: no scleral icterus, pupils equal round and reactive, no palptable cervical adenopathy,  CV: RRR, no m/r/g no jvd Resp: Clear to auscultation bilaterally GI: abdomen is soft, non-tender, non-distended, normal bowel sounds, no hepatosplenomegaly MSK: extremities are warm, no edema.  Skin: warm, no rash Neuro:  no focal deficits Psych: appropriate affect   Diagnostic Studies  12/2019 echo IMPRESSIONS     1. Left  ventricular ejection fraction, by estimation, is 55 to 60%. The  left ventricle has normal function. The left ventricle demonstrates  regional wall motion abnormalities (see scoring diagram/findings for  description). There is mild left ventricular   hypertrophy. Left ventricular diastolic parameters were normal. There is  moderate hypokinesis of the left ventricular, apical. There is sluggish  apical flow without clear formed thrombus.   2. Right ventricular systolic function is normal. The right ventricular  size is normal. There is mildly elevated pulmonary artery systolic  pressure.   3. The mitral valve is normal in structure. Trivial mitral valve  regurgitation. No evidence of mitral stenosis.   4. The inferior vena cava is normal in size with greater than 50%  respiratory variability, suggesting right atrial pressure of 3 mmHg.   5. The aortic valve is tricuspid. Aortic valve regurgitation is not  visualized. No aortic stenosis is present.     02/2020 nuclear stress No diagnostic ST segment changes to indicate ischemia. Large, severe intensity, periapical defect extending into the anterolateral wall and inferior septum that is largely fixed and consistent with infarct scar. There is a small region of apical anterolateral reversibility indicating a mild ischemic territory. This is a high risk study based on defect size although region is most consistent with scar and a mild region of peri-infarct ischemia. Nuclear stress EF: 48%. Large area of periapical akinesis.     Jan 2022 echo IMPRESSIONS     1. The mid to distal anteroseptal wall is akinetic. The apex is akinetic  with some portions dyskinetic, there is no signs of apical thrombus with  use of echocontrast. The mid to distal inferoseptal wall is akinetic. Marland Kitchen  Left ventricular ejection fraction,   by estimation, is 35 to 40%. The left ventricle has moderately decreased  function. Left ventricular diastolic parameters are  consistent with Grade  III diastolic dysfunction (restrictive). Elevated left atrial pressure.   2.  Right ventricular systolic function is normal. The right ventricular  size is normal.   3. Left atrial size was mildly dilated.   4. The mitral valve is normal in structure. Mild mitral valve  regurgitation. No evidence of mitral stenosis.   5. The inferior vena cava is normal in size with greater than 50%  respiratory variability, suggesting right atrial pressure of 3 mmHg.   6. Limited echo to evaluate LV function and wall motion.    Assessment and Plan  1.. 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 -prior side effects on higerh beta blocker dosing. Headache on higher imdur - avoid ACE/ARB/ARNI/aldactone given renal function. GFR borderline for considering farxiga/jardiance - we will increase hydralazine to 37.'5mg'$  tid. F/u pcp labs to see if any changes in renal function that may alter her regimen   2. HTN - With her CVA, CKD, and CHF needs optimal bp control - increase hydralazine to 37.'5mg'$  tid.    3. CAD - distal/small vessel disease by 2015 cath in [facility] - with recent echo and nuclear findings would suggest she had an apical infarct in the past -  With her renal dysfunction we have not repeated a cath.  -no symptoms, continue current meds   4. Hyperlipidemia -continue current meds, request labs from pcp   Arnoldo Lenis, M.D.

## 2021-02-19 NOTE — Patient Instructions (Signed)
Medication Instructions:  Increase Hydralazine to 37.'5mg'$  three times per day.   Continue all other medications.     Labwork: none  Testing/Procedures: none  Follow-Up: 4 months   Any Other Special Instructions Will Be Listed Below (If Applicable).   If you need a refill on your cardiac medications before your next appointment, please call your pharmacy.

## 2021-02-21 ENCOUNTER — Encounter (HOSPITAL_COMMUNITY): Payer: Self-pay | Admitting: Emergency Medicine

## 2021-02-21 ENCOUNTER — Emergency Department (HOSPITAL_COMMUNITY)
Admission: EM | Admit: 2021-02-21 | Discharge: 2021-02-21 | Disposition: A | Discharge status: Home / Self Care | Payer: BLUE CROSS/BLUE SHIELD | Attending: Emergency Medicine | Admitting: Emergency Medicine

## 2021-02-21 ENCOUNTER — Other Ambulatory Visit: Payer: Self-pay

## 2021-02-21 DIAGNOSIS — I130 Hypertensive heart and chronic kidney disease with heart failure and stage 1 through stage 4 chronic kidney disease, or unspecified chronic kidney disease: Secondary | ICD-10-CM | POA: Diagnosis not present

## 2021-02-21 DIAGNOSIS — E1051 Type 1 diabetes mellitus with diabetic peripheral angiopathy without gangrene: Secondary | ICD-10-CM | POA: Diagnosis not present

## 2021-02-21 DIAGNOSIS — I2510 Atherosclerotic heart disease of native coronary artery without angina pectoris: Secondary | ICD-10-CM | POA: Diagnosis not present

## 2021-02-21 DIAGNOSIS — I509 Heart failure, unspecified: Secondary | ICD-10-CM | POA: Diagnosis not present

## 2021-02-21 DIAGNOSIS — N183 Chronic kidney disease, stage 3 unspecified: Secondary | ICD-10-CM | POA: Diagnosis not present

## 2021-02-21 DIAGNOSIS — R102 Pelvic and perineal pain: Secondary | ICD-10-CM | POA: Insufficient documentation

## 2021-02-21 DIAGNOSIS — Z79899 Other long term (current) drug therapy: Secondary | ICD-10-CM | POA: Diagnosis not present

## 2021-02-21 DIAGNOSIS — R300 Dysuria: Secondary | ICD-10-CM | POA: Diagnosis not present

## 2021-02-21 DIAGNOSIS — Z87891 Personal history of nicotine dependence: Secondary | ICD-10-CM | POA: Diagnosis not present

## 2021-02-21 DIAGNOSIS — Z794 Long term (current) use of insulin: Secondary | ICD-10-CM | POA: Insufficient documentation

## 2021-02-21 DIAGNOSIS — E039 Hypothyroidism, unspecified: Secondary | ICD-10-CM | POA: Diagnosis not present

## 2021-02-21 LAB — URINALYSIS, ROUTINE W REFLEX MICROSCOPIC
Bilirubin Urine: NEGATIVE
Glucose, UA: >=500 mg/dL — AB
Hgb urine dipstick: NEGATIVE
Ketones, ur: 5 mg/dL — AB
Leukocytes,Ua: NEGATIVE
Nitrite: POSITIVE — AB
Protein, ur: >=300 mg/dL — AB
Specific Gravity, Urine: 1.02 (ref 1.005–1.030)
pH: 7 (ref 5.0–8.0)

## 2021-02-21 LAB — PREGNANCY, URINE: Preg Test, Ur: NEGATIVE

## 2021-02-21 MED ORDER — CEPHALEXIN 500 MG PO CAPS: 500.0000 mg | ORAL_CAPSULE | Freq: Two times a day (BID) | ORAL | 0 refills | Status: DC

## 2021-02-21 MED ORDER — CEPHALEXIN 500 MG PO CAPS
500.0000 mg | ORAL_CAPSULE | Freq: Once | ORAL | Status: AC
  Administered 2021-02-21: 500 mg via ORAL
  Filled 2021-02-21: qty 1

## 2021-02-21 NOTE — ED Provider Notes (Signed)
[facility] EMERGENCY DEPARTMENT Provider Note   CSN: [id number] Arrival date & time: 02/21/21  0100     History Chief Complaint  Patient presents with   Vaginal Pain    Lauren [name] is a 40 y.o. female.  The history is provided by the patient.  Vaginal Pain This is a new problem. The current episode started 2 days ago. The problem occurs constantly. The problem has been gradually worsening. Pertinent negatives include no abdominal pain. Nothing (palpation) aggravates the symptoms. Nothing relieves the symptoms.      Past Medical History:  Diagnosis Date   Anemia    CAD (coronary artery disease)    a. s/p cath in 03/2014 showing 30% mid-LAD, moderate to severe disease along small D1, patent LCx, moderate to severe distal OM2 stenosis and moderate diffuse diease along RCA not amenable to PCI   CHF (congestive heart failure) ([place])    a. EF 55-60% in 12/2019 b. EF at 35-40% by echo in 05/2020   Diabetes mellitus without complication ([place])    Myocardial infarction ([place])    Renal disorder    stage 3 kidney disease   Stroke [facility])     Patient Active Problem List   Diagnosis Date Noted   Encounter for cervical Pap smear with pelvic exam 07/23/2020   Menorrhagia with regular cycle 07/23/2020   Iron deficiency anemia due to chronic blood loss 07/23/2020   Menstrual cramps 07/23/2020   Class 2 obesity 06/18/2020   CAD (coronary artery disease) 06/18/2020   CKD stage G3b/A2, GFR 30-44 and albumin creatinine ratio 30-299 mg/g (HCC) 06/18/2020   Unstable angina ([place]) 06/18/2020   Mixed hyperlipidemia    Class 2 obesity due to excess calories without serious comorbidity with body mass index (BMI) of 39.0 to 39.9 in adult    Acute renal failure superimposed on stage 3a chronic kidney disease ([place]) 01/01/2020   Acute ischemic stroke ([place]) 01/01/2020   Tobacco abuse 01/01/2020   Chest pain 12/31/2019   Cerebrovascular accident (CVA) ([place]) 12/31/2019   Left sided numbness  12/31/2019   Vitamin D deficiency 07/24/2015   Essential hypertension 03/17/2015   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[place]) 03/07/2015   Hypothyroidism 03/07/2015    Past Surgical History:  Procedure Laterality Date   Cardiac catherization     CHOLECYSTECTOMY     TUBAL LIGATION       OB History     Gravida  4   Para  2   Term  2   Preterm      AB  2   Living  2      SAB      IAB      Ectopic      Multiple      Live Births              Family History  Problem Relation Age of Onset   Thyroid disease Mother    Hypertension Mother    Hyperlipidemia Mother    CAD Mother    CVA Mother    Hyperlipidemia Father    Hypertension Father    CAD Father    Breast cancer Paternal Grandmother 65   Cancer Paternal Grandmother        lung    Social History   Tobacco Use   Smoking status: Former    Packs/day: 1.00    Types: Cigarettes   Smokeless tobacco: Never  Vaping Use   Vaping Use: Never used  Substance  Use Topics   Alcohol use: No    Alcohol/week: 0.0 standard drinks   Drug use: No    Home Medications Prior to Admission medications   Medication Sig Start Date End Date Taking? Authorizing Provider  cephALEXin (KEFLEX) 500 MG capsule Take 1 capsule (500 mg total) by mouth 2 (two) times daily. 02/21/21  Yes Ripley Fraise, MD  atorvastatin (LIPITOR) 40 MG tablet TAKE 1 TABLET BY MOUTH ONCE DAILY. 09/24/20   Arnoldo Lenis, MD  clopidogrel (PLAVIX) 75 MG tablet TAKE 1 TABLET BY MOUTH ONCE DAILY. 05/13/20   Arnoldo Lenis, MD  Continuous Blood Gluc Sensor (DEXCOM G6 SENSOR) MISC SMARTSIG:1 Each Topical Every 10 Days 06/30/20   [provider]  Continuous Blood Gluc Transmit (DEXCOM G6 TRANSMITTER) MISC  06/30/20   [provider]  fenofibrate 160 MG tablet TAKE 1 TABLET BY MOUTH ONCE DAILY. 10/01/20   Arnoldo Lenis, MD  ferrous sulfate 325 (65 FE) MG tablet Take 1 tablet (325 mg total) by mouth daily with  breakfast. 06/20/20   Johnson, Clanford L, MD  furosemide (LASIX) 40 MG tablet Take 40 mg by mouth daily as needed for fluid.     [provider]  glucagon (GLUCAGON EMERGENCY) 1 MG injection Inject 1 mg into the vein once as needed. 04/10/15   Cassandria Anger, MD  hydrALAZINE (APRESOLINE) 25 MG tablet Take 1.5 tablets (37.5 mg total) by mouth 3 (three) times daily. 02/19/21   Arnoldo Lenis, MD  insulin glargine (LANTUS SOLOSTAR) 100 UNIT/ML Solostar Pen Inject 40 Units into the skin in the morning. Patient not taking: Reported on 02/19/2021 06/19/20   Murlean Iba, MD  isosorbide mononitrate (IMDUR) 30 MG 24 hr tablet Take 0.5 tablets (15 mg total) by mouth daily. Patient taking differently: Take 30 mg by mouth daily. 07/09/20 02/19/21  Strader, Fransisco Hertz, PA-C  levothyroxine (SYNTHROID) 175 MCG tablet Take 175 mcg by mouth daily before breakfast.  08/12/16   [provider]  metoprolol succinate (TOPROL-XL) 25 MG 24 hr tablet TAKE 1/2 A TABLET BY MOUTH IN THE MORNING AND AT BEDTIME. 01/20/21   Branch, Alphonse Guild, MD  nitroGLYCERIN (NITROSTAT) 0.4 MG SL tablet Place 0.4 mg under the tongue every 5 (five) minutes x 3 doses as needed for chest pain.    [provider]  NOVOLOG FLEXPEN 100 UNIT/ML FlexPen Inject into the skin. 09/24/20   [provider]  pantoprazole (PROTONIX) 40 MG tablet Take 40 mg by mouth daily as needed (for acid reflux).     [provider]  potassium chloride SA (K-DUR,KLOR-CON) 20 MEQ tablet Take 20 mEq by mouth daily as needed (When taking Lasix (Furosemide)).     [provider]  SEMGLEE, YFGN, 100 UNIT/ML Pen SMARTSIG:0-80 Unit(s) SUB-Q Daily 01/26/21   [provider]    Allergies    Wellbutrin [bupropion], Ciprofloxacin hcl, and Tape  Review of Systems   Review of Systems  Constitutional:  Negative for fever.  Gastrointestinal:  Negative for abdominal pain.  Genitourinary:  Positive for  vaginal pain. Negative for dyspareunia, dysuria, vaginal bleeding and vaginal discharge.   Physical Exam Updated Vital Signs BP (!) 170/99   Pulse 92   Temp 97.6 F (36.4 C) (Oral)   Resp 17   Ht 1.651 m (5\' 5" )   Wt 96.6 kg   SpO2 98%   BMI 35.45 kg/m   Physical Exam CONSTITUTIONAL: Well developed/well nourished HEAD: Normocephalic/atraumatic EYES: EOMI NECK: supple no meningeal  signs SPINE/BACK:entire spine nontender CV: S1/S2 noted LUNGS: Lungs are clear to auscultation bilaterally, no apparent distress ABDOMEN: soft, nontender, no rebound or guarding, bowel sounds noted throughout abdomen GU:no cva tenderness, female chaperone present for exam.  No abscess, no Bartholin cyst.  No evidence of herpes.  No signs of cellulitis.  Minimal erythema surrounding the urethral opening No vaginal bleeding or discharge is noted. No signs of yeast infection NEURO: Pt is awake/alert/appropriate, moves all extremitiesx4.   EXTREMITIES:  full ROM SKIN: warm, color normal PSYCH: no abnormalities of mood noted, alert and oriented to situation  ED Results / Procedures / Treatments   Labs (all labs ordered are listed, but only abnormal results are displayed) Labs Reviewed  URINALYSIS, ROUTINE W REFLEX MICROSCOPIC - Abnormal; Notable for the following components:      Result Value   Color, Urine AMBER (*)    Glucose, UA >=500 (*)    Ketones, ur 5 (*)    Protein, ur >=300 (*)    Nitrite POSITIVE (*)    Bacteria, UA RARE (*)    All other components within normal limits  PREGNANCY, URINE    EKG None  Radiology No results found.  Procedures Procedures   Medications Ordered in ED Medications  cephALEXin (KEFLEX) capsule 500 mg (500 mg Oral Given 02/21/21 0431)    ED Course  I have reviewed the triage vital signs and the nursing notes.  Pertinent labs results that were available during my care of the patient were reviewed by me and considered in my medical decision making  (see chart for details).    MDM Rules/Calculators/A&P                           Patient has erythema around the urethral opening, will treat for possible UTI.  No other concerning findings are noted. Advised follow gynecology if no improvement in 48-72 hrs Final Clinical Impression(s) / ED Diagnoses Final diagnoses:  Dysuria    Rx / DC Orders ED Discharge Orders          Ordered    cephALEXin (KEFLEX) 500 MG capsule  2 times daily        02/21/21 0421             Ripley Fraise, MD 02/21/21 [phone]

## 2021-02-21 NOTE — ED Triage Notes (Signed)
Pt from home, c/o vaginal pain that started 2 days ago. Pt says labia is tender to touch and swollen. Pt says she thought it may be a UTI, but doesn't hurt when urinating. Pt reports to be sexually active with spouse only.

## 2021-02-26 ENCOUNTER — Ambulatory Visit: Payer: BLUE CROSS/BLUE SHIELD | Admitting: Adult Health

## 2021-03-21 ENCOUNTER — Other Ambulatory Visit: Payer: Self-pay | Admitting: Cardiology

## 2021-06-18 ENCOUNTER — Other Ambulatory Visit: Payer: Self-pay

## 2021-06-18 ENCOUNTER — Observation Stay (HOSPITAL_COMMUNITY): Payer: No Typology Code available for payment source

## 2021-06-18 ENCOUNTER — Encounter (HOSPITAL_COMMUNITY): Payer: Self-pay | Admitting: *Deleted

## 2021-06-18 ENCOUNTER — Emergency Department (HOSPITAL_COMMUNITY): Payer: No Typology Code available for payment source

## 2021-06-18 ENCOUNTER — Observation Stay (HOSPITAL_COMMUNITY)
Admission: EM | Admit: 2021-06-18 | Discharge: 2021-06-19 | Disposition: A | Discharge status: Home / Self Care | Payer: No Typology Code available for payment source | Attending: Internal Medicine | Admitting: Internal Medicine

## 2021-06-18 DIAGNOSIS — N184 Chronic kidney disease, stage 4 (severe): Secondary | ICD-10-CM | POA: Diagnosis present

## 2021-06-18 DIAGNOSIS — Z8673 Personal history of transient ischemic attack (TIA), and cerebral infarction without residual deficits: Secondary | ICD-10-CM | POA: Diagnosis not present

## 2021-06-18 DIAGNOSIS — E1040 Type 1 diabetes mellitus with diabetic neuropathy, unspecified: Secondary | ICD-10-CM | POA: Diagnosis not present

## 2021-06-18 DIAGNOSIS — E782 Mixed hyperlipidemia: Secondary | ICD-10-CM | POA: Diagnosis not present

## 2021-06-18 DIAGNOSIS — E66812 Obesity, class 2: Secondary | ICD-10-CM | POA: Diagnosis present

## 2021-06-18 DIAGNOSIS — I10 Essential (primary) hypertension: Secondary | ICD-10-CM | POA: Diagnosis present

## 2021-06-18 DIAGNOSIS — M79672 Pain in left foot: Secondary | ICD-10-CM | POA: Diagnosis present

## 2021-06-18 DIAGNOSIS — I130 Hypertensive heart and chronic kidney disease with heart failure and stage 1 through stage 4 chronic kidney disease, or unspecified chronic kidney disease: Secondary | ICD-10-CM | POA: Diagnosis not present

## 2021-06-18 DIAGNOSIS — E1059 Type 1 diabetes mellitus with other circulatory complications: Secondary | ICD-10-CM

## 2021-06-18 DIAGNOSIS — Z20822 Contact with and (suspected) exposure to covid-19: Secondary | ICD-10-CM | POA: Insufficient documentation

## 2021-06-18 DIAGNOSIS — N179 Acute kidney failure, unspecified: Secondary | ICD-10-CM | POA: Insufficient documentation

## 2021-06-18 DIAGNOSIS — E039 Hypothyroidism, unspecified: Secondary | ICD-10-CM | POA: Insufficient documentation

## 2021-06-18 DIAGNOSIS — I5042 Chronic combined systolic (congestive) and diastolic (congestive) heart failure: Secondary | ICD-10-CM | POA: Diagnosis not present

## 2021-06-18 DIAGNOSIS — Z7984 Long term (current) use of oral hypoglycemic drugs: Secondary | ICD-10-CM | POA: Insufficient documentation

## 2021-06-18 DIAGNOSIS — I2510 Atherosclerotic heart disease of native coronary artery without angina pectoris: Secondary | ICD-10-CM | POA: Insufficient documentation

## 2021-06-18 DIAGNOSIS — N1831 Chronic kidney disease, stage 3a: Secondary | ICD-10-CM | POA: Insufficient documentation

## 2021-06-18 DIAGNOSIS — L03116 Cellulitis of left lower limb: Secondary | ICD-10-CM | POA: Diagnosis not present

## 2021-06-18 DIAGNOSIS — E669 Obesity, unspecified: Secondary | ICD-10-CM | POA: Insufficient documentation

## 2021-06-18 DIAGNOSIS — Z79899 Other long term (current) drug therapy: Secondary | ICD-10-CM | POA: Diagnosis not present

## 2021-06-18 DIAGNOSIS — L0390 Cellulitis, unspecified: Secondary | ICD-10-CM | POA: Diagnosis present

## 2021-06-18 HISTORY — DX: Polyneuropathy, unspecified: G62.9

## 2021-06-18 LAB — COMPREHENSIVE METABOLIC PANEL
ALT: 24 U/L (ref 0–44)
AST: 23 U/L (ref 15–41)
Albumin: 3.1 g/dL — ABNORMAL LOW (ref 3.5–5.0)
Alkaline Phosphatase: 80 U/L (ref 38–126)
Anion gap: 12 (ref 5–15)
BUN: 38 mg/dL — ABNORMAL HIGH (ref 6–20)
CO2: 18 mmol/L — ABNORMAL LOW (ref 22–32)
Calcium: 8.1 mg/dL — ABNORMAL LOW (ref 8.9–10.3)
Chloride: 105 mmol/L (ref 98–111)
Creatinine, Ser: 3.06 mg/dL — ABNORMAL HIGH (ref 0.44–1.00)
GFR, Estimated: 19 mL/min — ABNORMAL LOW (ref >60)
Glucose, Bld: 136 mg/dL — ABNORMAL HIGH (ref 70–99)
Potassium: 3.6 mmol/L (ref 3.5–5.1)
Sodium: 135 mmol/L (ref 135–145)
Total Bilirubin: 0.6 mg/dL (ref 0.3–1.2)
Total Protein: 5.8 g/dL — ABNORMAL LOW (ref 6.5–8.1)

## 2021-06-18 LAB — GLUCOSE, CAPILLARY
Glucose-Capillary: 52 mg/dL — ABNORMAL LOW (ref 70–99)
Glucose-Capillary: 92 mg/dL (ref 70–99)
Glucose-Capillary: 229 mg/dL — ABNORMAL HIGH (ref 70–99)

## 2021-06-18 LAB — CBC WITH DIFFERENTIAL/PLATELET
Abs Immature Granulocytes: 0.07 10*3/uL (ref 0.00–0.07)
Basophils Absolute: 0.1 10*3/uL (ref 0.0–0.1)
Basophils Relative: 0 %
Eosinophils Absolute: 0.2 10*3/uL (ref 0.0–0.5)
Eosinophils Relative: 2 %
HCT: 37.8 % (ref 36.0–46.0)
Hemoglobin: 12.2 g/dL (ref 12.0–15.0)
Immature Granulocytes: 1 %
Lymphocytes Relative: 12 %
Lymphs Abs: 1.5 10*3/uL (ref 0.7–4.0)
MCH: 29.8 pg (ref 26.0–34.0)
MCHC: 32.3 g/dL (ref 30.0–36.0)
MCV: 92.2 fL (ref 80.0–100.0)
Monocytes Absolute: 0.9 10*3/uL (ref 0.1–1.0)
Monocytes Relative: 7 %
Neutro Abs: 10 10*3/uL — ABNORMAL HIGH (ref 1.7–7.7)
Neutrophils Relative %: 78 %
Platelets: 292 10*3/uL (ref 150–400)
RBC: 4.1 MIL/uL (ref 3.87–5.11)
RDW: 12.7 % (ref 11.5–15.5)
WBC: 12.7 10*3/uL — ABNORMAL HIGH (ref 4.0–10.5)
nRBC: 0 % (ref 0.0–0.2)

## 2021-06-18 LAB — LACTIC ACID, PLASMA: Lactic Acid, Venous: 0.6 mmol/L (ref 0.5–1.9)

## 2021-06-18 LAB — C-REACTIVE PROTEIN: CRP: 1 mg/dL — ABNORMAL HIGH (ref <1.0)

## 2021-06-18 LAB — RESP PANEL BY RT-PCR (FLU A&B, COVID) ARPGX2
Influenza A by PCR: NEGATIVE
Influenza B by PCR: NEGATIVE
SARS Coronavirus 2 by RT PCR: NEGATIVE

## 2021-06-18 LAB — SEDIMENTATION RATE: Sed Rate: 33 mm/hr — ABNORMAL HIGH (ref 0–22)

## 2021-06-18 IMAGING — DX DG FOOT COMPLETE 3+V*L*
3 series · 3 of 3 positions shown · non-contrast
Comparison: Foot radiographs [DATE]

CLINICAL DATA: Diabetes, wound to plantar surface of left foot for
months, concern for osteomyelitis

EXAM:
LEFT FOOT - COMPLETE 3+ VIEW

[foot ap]
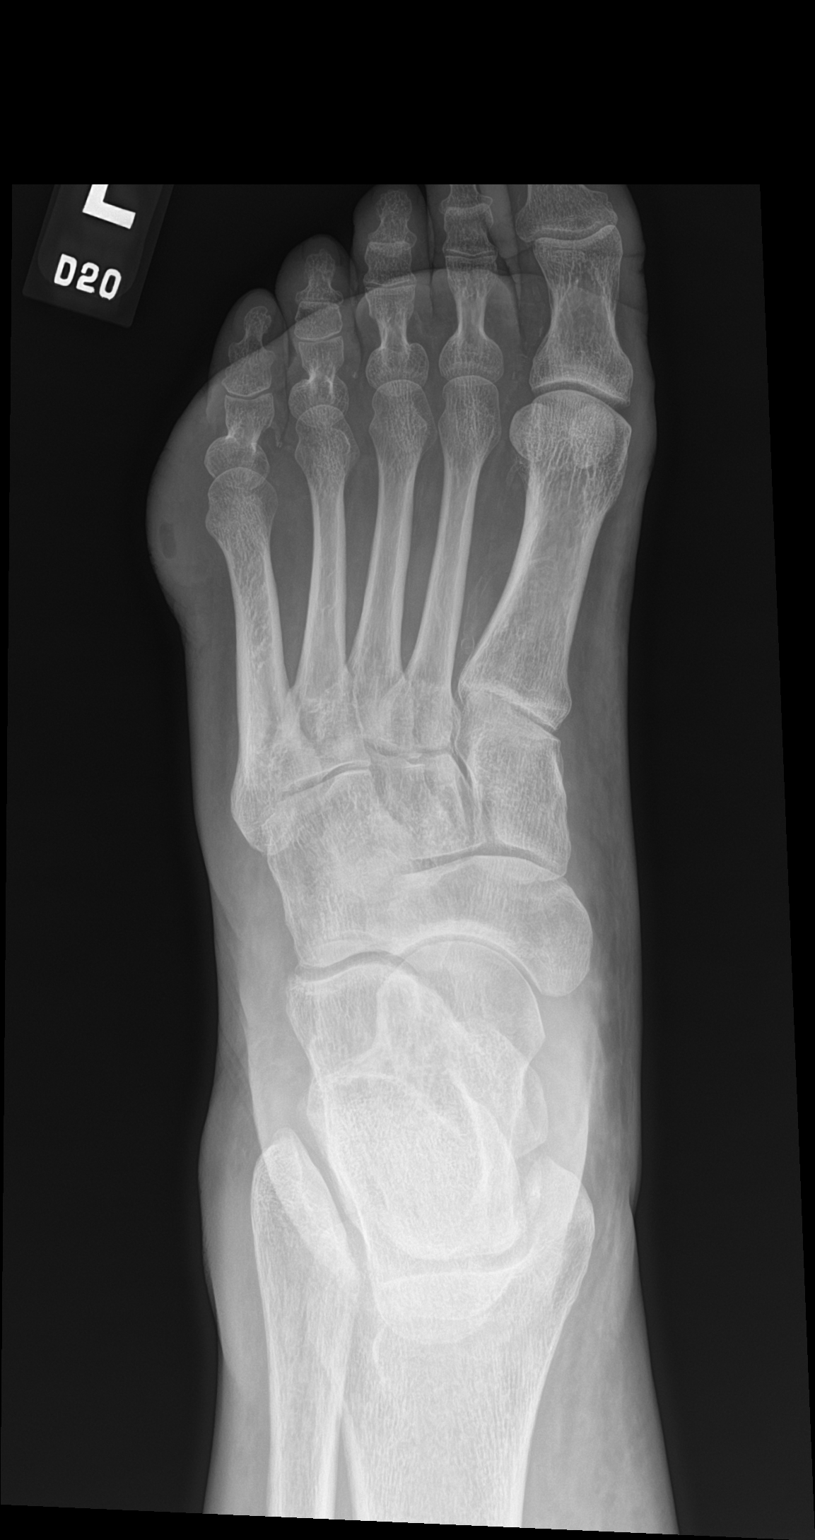

[foot obl]
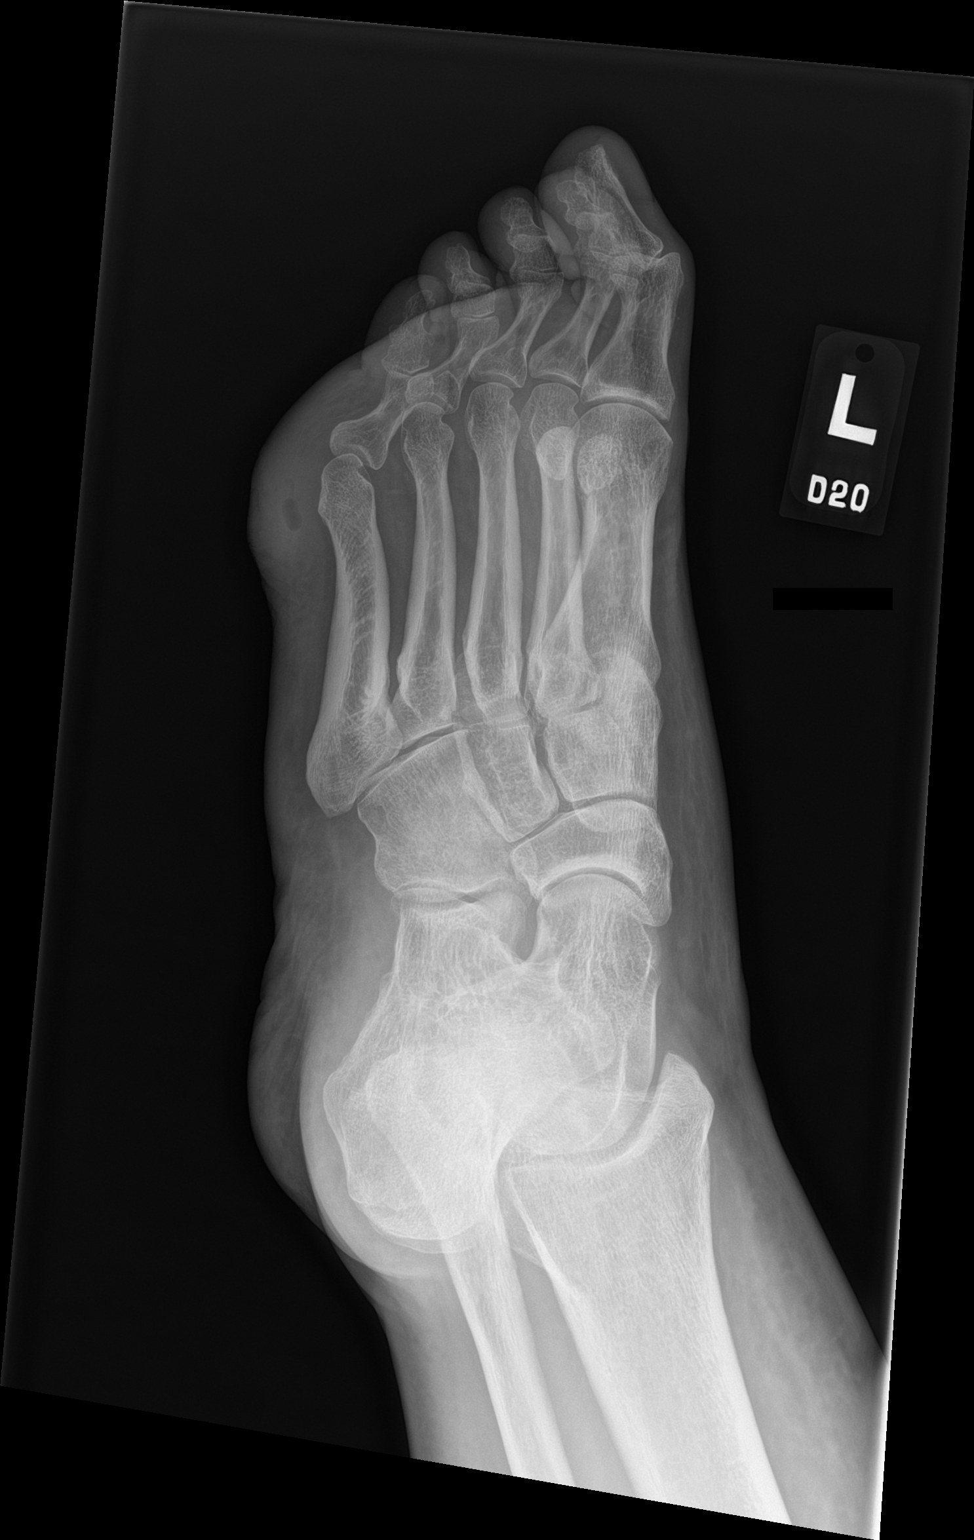

[foot lat]
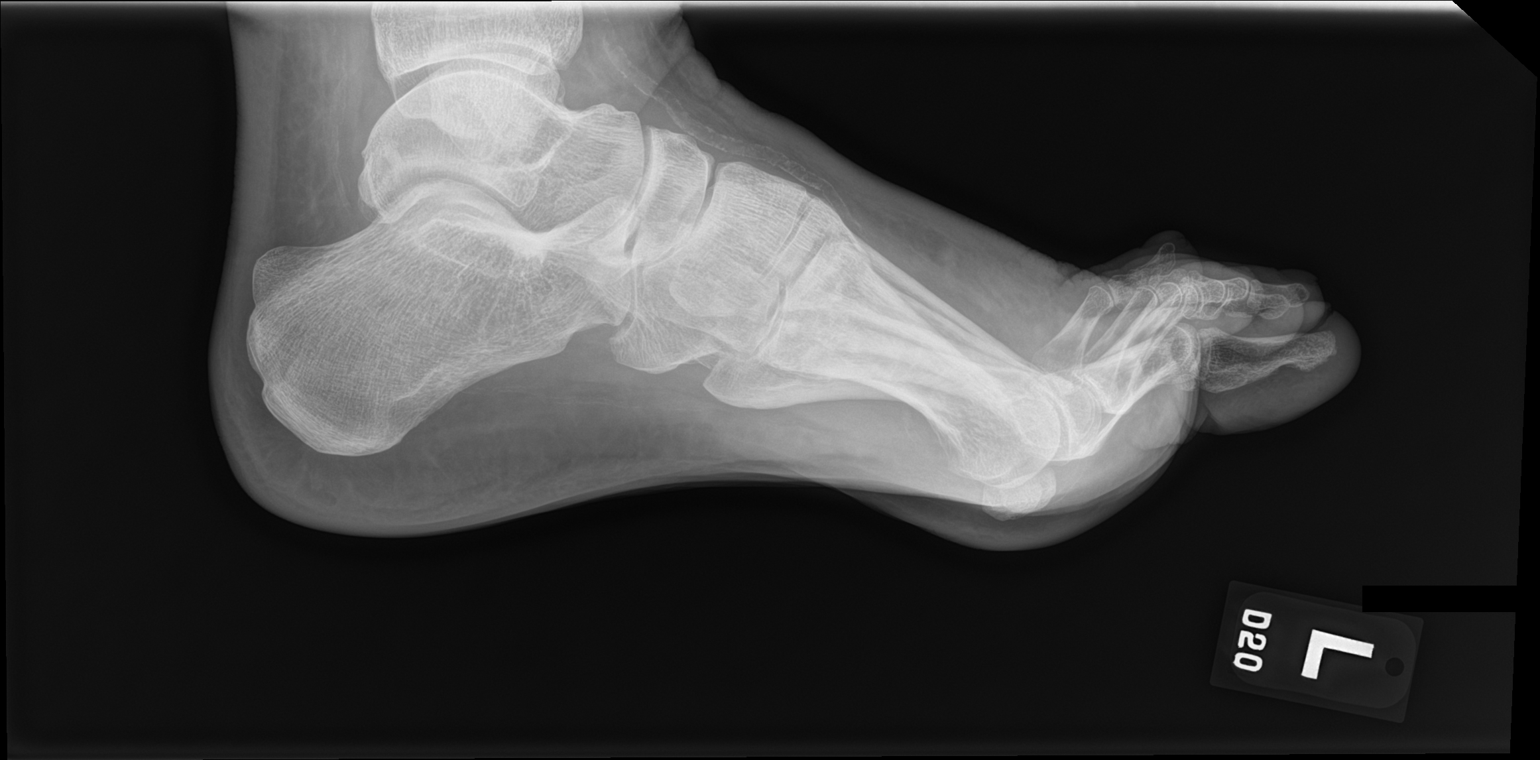

[3 of 3 positions shown; findings below may reference images not displayed]

FINDINGS: There is lucency and swelling in the soft tissues along the lateral
aspect of the fifth metatarsal/MTP joint likely corresponding to the
reported wound. There is no destruction or erosion of the osseous
bone to suggest definite osteomyelitis. There is no acute fracture
or dislocation. Alignment is normal. The Lisfranc and Chopart joints
are intact. Vascular calcifications are noted.
IMPRESSION: Lucency and swelling in the soft tissues along the lateral aspect of
the fifth metatarsal/MTP joint likely corresponding to the reported
wound. There is no osseous destruction to suggest definite
osteomyelitis, though if there is high clinical concern, MRI is
recommended.

## 2021-06-18 IMAGING — MR MR FOOT*L* W/O CM
5 series · 40 of 40 positions shown · non-contrast
Comparison: Radiograph [DATE]

CLINICAL DATA: Foot swelling, nondiabetic, osteomyelitis suspected

EXAM:
MRI OF THE LEFT FOOT WITHOUT CONTRAST
TECHNIQUE: Multiplanar, multisequence MR imaging of the left foot was
performed. No intravenous contrast was administered.

[Series 3: T1 · coronal · left · 3.0mm · 0.38mm/px · 11 of 40 slices shown (1 of 2)]
[im 1/40]
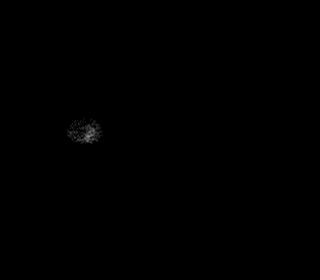
[im 4/40]
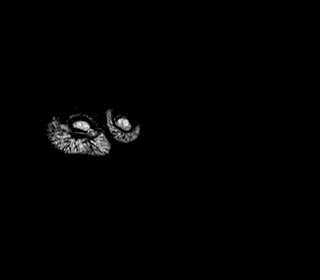
[im 8/40]
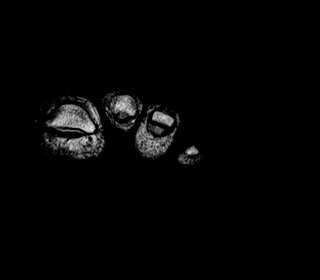
[im 12/40]
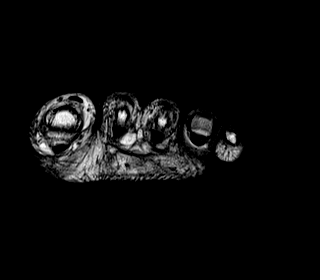
[im 16/40]
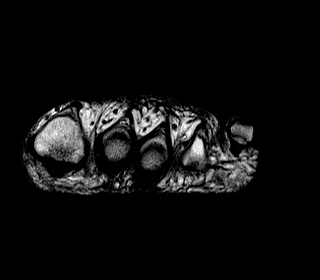
[im 20/40]
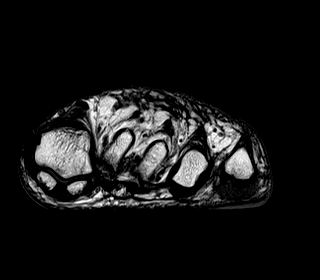
[im 24/40]
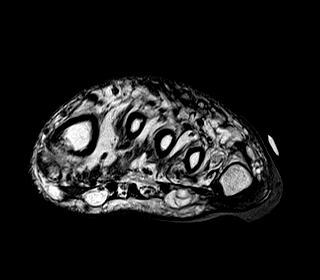
[im 28/40]
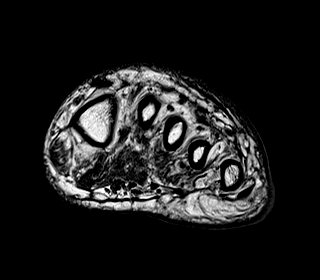
[im 32/40]
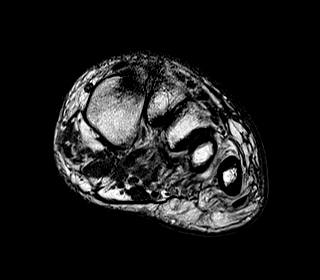
[im 36/40]
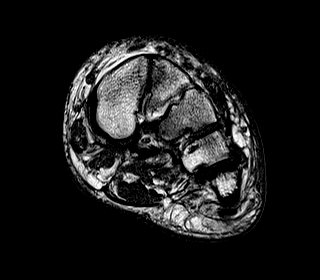
[im 40/40]
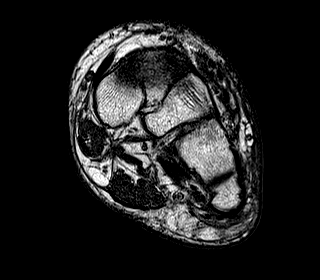

[Series 4: T2 fat-sat · coronal · left · 3.0mm · 0.38mm/px · 11 of 40 slices shown (1 of 2)]
[im 1/40]
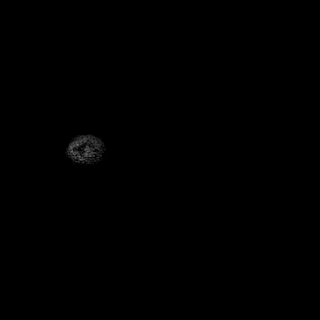
[im 4/40]
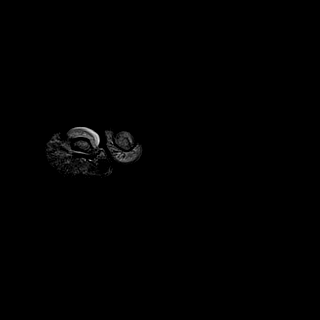
[im 8/40]
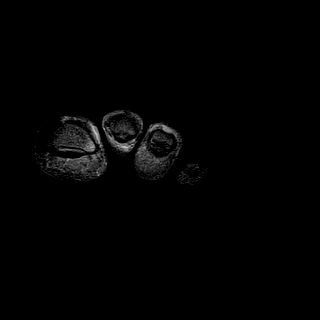
[im 12/40]
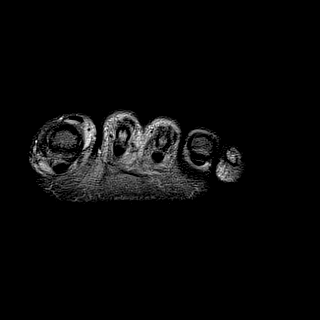
[im 16/40]
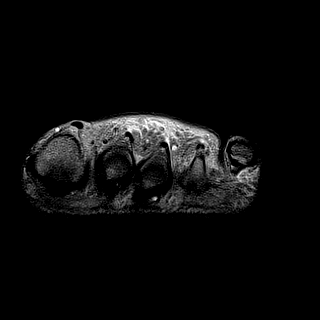
[im 20/40]
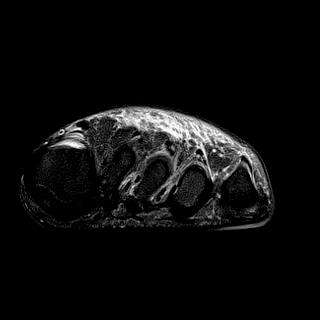
[im 24/40]
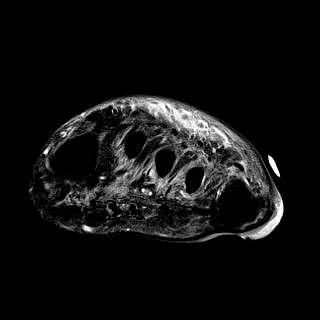
[im 28/40]
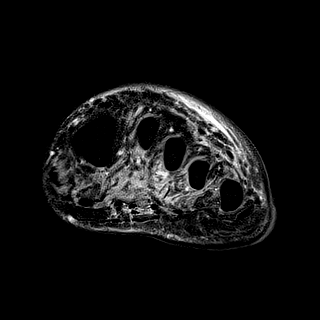
[im 32/40]
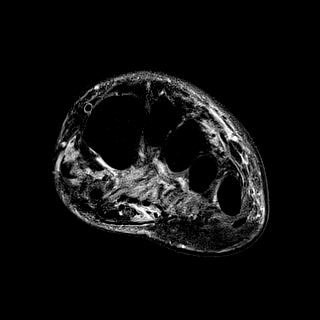
[im 36/40]
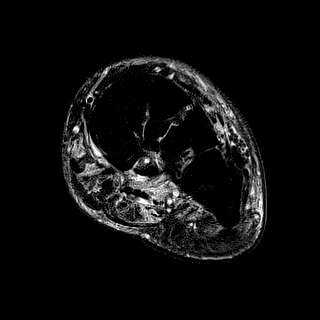
[im 40/40]
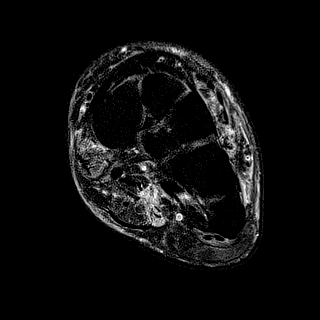

[Series 5: T1 · axial · left · 3.0mm · 0.70mm/px · z∈[-66,+3]mm · 5 of 20 slices shown (2 of 2)]
[im 1/20]
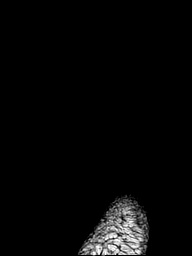
[im 5/20]
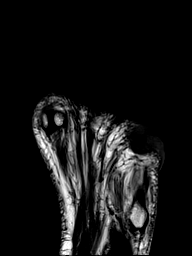
[im 10/20]
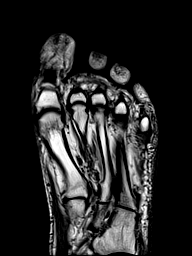
[im 15/20]
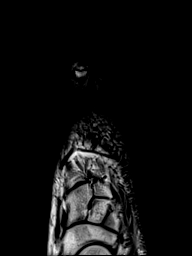
[im 20/20]
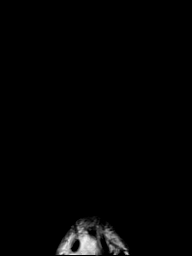

[Series 6: T2 fat-sat · axial · left · 3.0mm · 0.70mm/px · z∈[-63,+6]mm · 5 of 20 slices shown (2 of 2)]
[im 1/20]
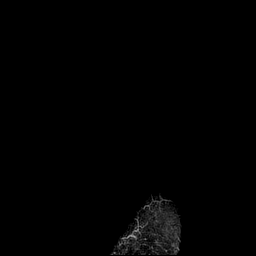
[im 5/20]
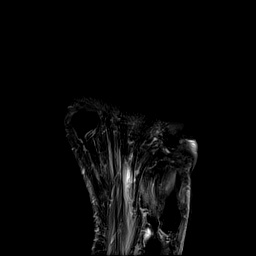
[im 10/20]
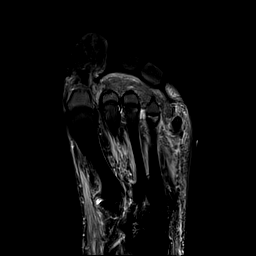
[im 15/20]
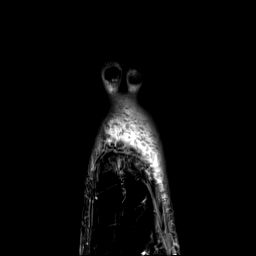
[im 20/20]
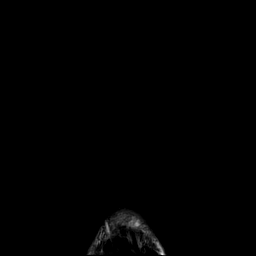

[Series 7: STIR · sagittal · left · 3.0mm · 0.70mm/px · 8 of 30 slices shown]
[im 1/30]
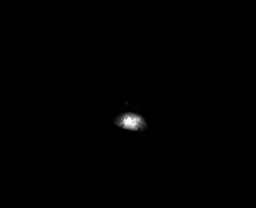
[im 5/30]
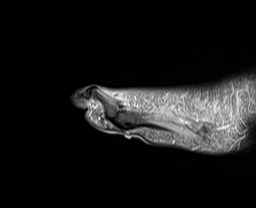
[im 9/30]
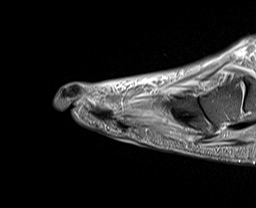
[im 13/30]
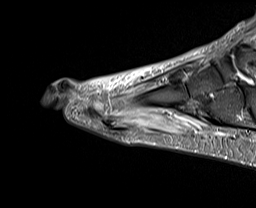
[im 17/30]
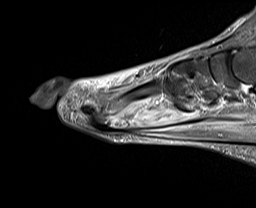
[im 21/30]
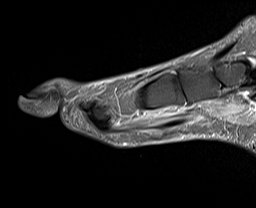
[im 25/30]
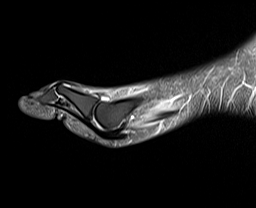
[im 30/30]
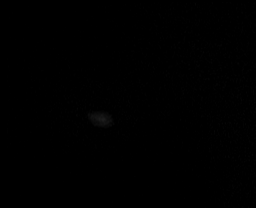

[40 of 40 positions shown; findings below may reference images not displayed]

FINDINGS: Bones/Joint/Cartilage

The cortex is intact. There is no significant marrow signal
alteration.

Ligaments

Intact Lisfranc ligament.  Intact collateral ligaments.

Muscles and Tendons

Diffuse intramuscular edema and atrophy of the foot as is commonly
seen in diabetics.

Soft tissues

Diffuse soft tissue swelling in the forefoot. There is a soft tissue
wound in the lateral forefoot adjacent to the fifth MTP joint, with
skin thickening and focal swelling. These are a sliver of fluid
measuring 1-2 mm thick just deep to this, likely reactive
adventitial bursitis.
IMPRESSION: No evidence of osteomyelitis. Soft tissue wound in the lateral foot
foot adjacent to the fifth MTP joint, with skin thickening and focal
swelling. Sliver of fluid measuring 1-2 mm thick just deep to this,
possibly draining through an open wound, correlate with exam.

## 2021-06-18 MED ORDER — SODIUM CHLORIDE 0.9 % IV SOLN
2.0000 g | INTRAVENOUS | Status: DC
  Administered 2021-06-19: 2 g via INTRAVENOUS
  Filled 2021-06-18: qty 2

## 2021-06-18 MED ORDER — LEVOTHYROXINE SODIUM 75 MCG PO TABS
175.0000 ug | ORAL_TABLET | Freq: Every day | ORAL | Status: DC
  Administered 2021-06-19: 175 ug via ORAL
  Filled 2021-06-18: qty 1

## 2021-06-18 MED ORDER — ACETAMINOPHEN 650 MG RE SUPP: 650.0000 mg | Freq: Four times a day (QID) | RECTAL | Status: DC | PRN

## 2021-06-18 MED ORDER — ISOSORBIDE MONONITRATE ER 30 MG PO TB24
15.0000 mg | ORAL_TABLET | Freq: Every day | ORAL | Status: DC
  Administered 2021-06-18 – 2021-06-19 (×2): 15 mg via ORAL
  Filled 2021-06-18 (×2): qty 1

## 2021-06-18 MED ORDER — LACTATED RINGERS IV BOLUS
1000.0000 mL | Freq: Once | INTRAVENOUS | Status: AC
  Administered 2021-06-18: 1000 mL via INTRAVENOUS

## 2021-06-18 MED ORDER — ATORVASTATIN CALCIUM 40 MG PO TABS
40.0000 mg | ORAL_TABLET | Freq: Every day | ORAL | Status: DC
  Administered 2021-06-18 – 2021-06-19 (×2): 40 mg via ORAL
  Filled 2021-06-18 (×2): qty 1

## 2021-06-18 MED ORDER — POTASSIUM CHLORIDE 2 MEQ/ML IV SOLN
INTRAVENOUS | Status: DC
  Filled 2021-06-18 (×5): qty 1000

## 2021-06-18 MED ORDER — VANCOMYCIN HCL 1250 MG/250ML IV SOLN: 1250.0000 mg | INTRAVENOUS | Status: DC

## 2021-06-18 MED ORDER — VANCOMYCIN HCL 2000 MG/400ML IV SOLN
2000.0000 mg | Freq: Once | INTRAVENOUS | Status: AC
  Administered 2021-06-18: 2000 mg via INTRAVENOUS
  Filled 2021-06-18: qty 400

## 2021-06-18 MED ORDER — INSULIN ASPART 100 UNIT/ML IJ SOLN
0.0000 [IU] | Freq: Every day | INTRAMUSCULAR | Status: DC
  Administered 2021-06-18: 2 [IU] via SUBCUTANEOUS

## 2021-06-18 MED ORDER — INSULIN ASPART 100 UNIT/ML IJ SOLN
0.0000 [IU] | Freq: Three times a day (TID) | INTRAMUSCULAR | Status: DC
  Administered 2021-06-19: 3 [IU] via SUBCUTANEOUS

## 2021-06-18 MED ORDER — ACETAMINOPHEN 325 MG PO TABS
650.0000 mg | ORAL_TABLET | Freq: Four times a day (QID) | ORAL | Status: DC | PRN
  Administered 2021-06-18: 650 mg via ORAL
  Filled 2021-06-18: qty 2

## 2021-06-18 MED ORDER — SODIUM CHLORIDE 0.9 % IV SOLN: 2.0000 g | Freq: Once | INTRAVENOUS | Status: DC

## 2021-06-18 MED ORDER — SODIUM CHLORIDE 0.9 % IV SOLN
2.0000 g | Freq: Once | INTRAVENOUS | Status: AC
  Administered 2021-06-18: 2 g via INTRAVENOUS
  Filled 2021-06-18: qty 2

## 2021-06-18 MED ORDER — HEPARIN SODIUM (PORCINE) 5000 UNIT/ML IJ SOLN
5000.0000 [IU] | Freq: Three times a day (TID) | INTRAMUSCULAR | Status: DC
  Administered 2021-06-18: 5000 [IU] via SUBCUTANEOUS
  Filled 2021-06-18: qty 1

## 2021-06-18 MED ORDER — METOPROLOL SUCCINATE ER 25 MG PO TB24
25.0000 mg | ORAL_TABLET | Freq: Every day | ORAL | Status: DC
  Administered 2021-06-18 – 2021-06-19 (×2): 25 mg via ORAL
  Filled 2021-06-18 (×2): qty 1

## 2021-06-18 MED ORDER — VANCOMYCIN HCL IN DEXTROSE 1-5 GM/200ML-% IV SOLN: 1000.0000 mg | Freq: Once | INTRAVENOUS | Status: DC

## 2021-06-18 MED ORDER — ACETAMINOPHEN 500 MG PO TABS
1000.0000 mg | ORAL_TABLET | Freq: Once | ORAL | Status: AC
Start: 2021-06-18 — End: 2021-06-18
  Administered 2021-06-18: 1000 mg via ORAL

## 2021-06-18 MED ORDER — CLOPIDOGREL BISULFATE 75 MG PO TABS
75.0000 mg | ORAL_TABLET | Freq: Every day | ORAL | Status: DC
  Administered 2021-06-19: 75 mg via ORAL
  Filled 2021-06-18: qty 1

## 2021-06-18 MED ORDER — FENOFIBRATE 160 MG PO TABS
160.0000 mg | ORAL_TABLET | Freq: Every day | ORAL | Status: DC
  Administered 2021-06-18 – 2021-06-19 (×2): 160 mg via ORAL
  Filled 2021-06-18 (×2): qty 1

## 2021-06-18 MED ORDER — INSULIN GLARGINE-YFGN 100 UNIT/ML ~~LOC~~ SOLN
35.0000 [IU] | Freq: Two times a day (BID) | SUBCUTANEOUS | Status: DC
  Administered 2021-06-18 – 2021-06-19 (×3): 35 [IU] via SUBCUTANEOUS
  Filled 2021-06-18 (×6): qty 0.35

## 2021-06-18 MED ORDER — ONDANSETRON HCL 4 MG PO TABS: 4.0000 mg | ORAL_TABLET | Freq: Four times a day (QID) | ORAL | Status: DC | PRN

## 2021-06-18 MED ORDER — ONDANSETRON HCL 4 MG/2ML IJ SOLN
4.0000 mg | Freq: Four times a day (QID) | INTRAMUSCULAR | Status: DC | PRN
Start: 2021-06-18 — End: 2021-06-19

## 2021-06-18 NOTE — Assessment & Plan Note (Addendum)
Continue home dose of basal insulin and supplement with SSI Outpatient referral to endocrinology made on discharge A1c 9.8

## 2021-06-18 NOTE — ED Notes (Signed)
ED Provider at bedside. 

## 2021-06-18 NOTE — Assessment & Plan Note (Addendum)
EF 35-40% with grade 3 DD Appears compensated She reports that she only uses lasix prn and has not required it in quite some time

## 2021-06-18 NOTE — Assessment & Plan Note (Signed)
Continue synthroid.

## 2021-06-18 NOTE — Assessment & Plan Note (Signed)
Will need lifestyle adjustments

## 2021-06-18 NOTE — ED Notes (Signed)
Patient transported to MRI 

## 2021-06-18 NOTE — Assessment & Plan Note (Addendum)
Started on vancomycin and cefepime MRI foot negative for osteomyelitis or abscess Antibiotics transitioned to oral doxycycline and augmentin Outpatient referral to podiatry made

## 2021-06-18 NOTE — Assessment & Plan Note (Signed)
Continue on plavix

## 2021-06-18 NOTE — ED Provider Notes (Signed)
[facility] EMERGENCY DEPARTMENT Provider Note  CSN: [id number] Arrival date & time: 06/18/21 2426  Chief Complaint(s) Wound Check  HPI Lauren [name] is a 41 y.o. female with PMH CAD, CHF with EF 35 to 40%, T2DM, CVA, CKD 3 who presents emergency department for evaluation of a left foot wound.  Patient states that she developed a large callus-like lesion to the lateral aspect of the left foot near the fifth digit and was seen by her primary care physician who reportedly obtained an arthrocentesis that was negative for crystals or infection.  After performing this procedure, the patient was instructed to soak the foot and use topical salicylic acid as well as shave the callus down.  She states that since doing this, she has had worsening pain at the plantar aspect of the fifth digit on the left and swelling of the midfoot and into the calf.  She has no tenderness in the shin or midfoot.  Denies fever, chest pain, shortness of breath, abdominal pain, nausea, vomiting or other systemic symptoms.  Patient does have neuropathy.  Patient then admitted to hospital service.   Wound Check   Past Medical History Past Medical History:  Diagnosis Date   Anemia    CAD (coronary artery disease)    a. s/p cath in 03/2014 showing 30% mid-LAD, moderate to severe disease along small D1, patent LCx, moderate to severe distal OM2 stenosis and moderate diffuse diease along RCA not amenable to PCI   CHF (congestive heart failure) ([place])    a. EF 55-60% in 12/2019 b. EF at 35-40% by echo in 05/2020   Diabetes mellitus without complication (HCC)    Myocardial infarction ([place])    Neuropathy    Renal disorder    stage 3 kidney disease   Stroke [facility])    Patient Active Problem List   Diagnosis Date Noted   Encounter for cervical Pap smear with pelvic exam 07/23/2020   Menorrhagia with regular cycle 07/23/2020   Iron deficiency anemia due to chronic blood loss 07/23/2020   Menstrual cramps 07/23/2020    Class 2 obesity 06/18/2020   CAD (coronary artery disease) 06/18/2020   CKD stage G3b/A2, GFR 30-44 and albumin creatinine ratio 30-299 mg/g (HCC) 06/18/2020   Unstable angina ([place]) 06/18/2020   Mixed hyperlipidemia    Class 2 obesity due to excess calories without serious comorbidity with body mass index (BMI) of 39.0 to 39.9 in adult    Acute renal failure superimposed on stage 3a chronic kidney disease ([place]) 01/01/2020   Acute ischemic stroke ([place]) 01/01/2020   Tobacco abuse 01/01/2020   Chest pain 12/31/2019   Cerebrovascular accident (CVA) ([place]) 12/31/2019   Left sided numbness 12/31/2019   Vitamin D deficiency 07/24/2015   Essential hypertension 03/17/2015   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 Authorizing Provider  atorvastatin (LIPITOR) 40 MG tablet TAKE 1 TABLET BY MOUTH ONCE DAILY. 09/24/20   Arnoldo Lenis, MD  cephALEXin (KEFLEX) 500 MG capsule Take 1 capsule (500 mg total) by mouth 2 (two) times daily. 02/21/21   Ripley Fraise, MD  clopidogrel (PLAVIX) 75 MG tablet TAKE 1 TABLET BY MOUTH ONCE DAILY. 03/23/21   Arnoldo Lenis, MD  Continuous Blood Gluc Sensor (DEXCOM G6 SENSOR) MISC SMARTSIG:1 Each Topical Every 10 Days 06/30/20   [provider]  Continuous Blood Gluc Transmit (DEXCOM G6 TRANSMITTER) MISC  06/30/20   [provider]  fenofibrate 160 MG tablet TAKE 1 TABLET BY MOUTH ONCE DAILY. 10/01/20   Arnoldo Lenis, MD  ferrous sulfate 325 (65 FE) MG tablet Take 1 tablet (325 mg total) by mouth daily with breakfast. 06/20/20   Johnson, Clanford L, MD  furosemide (LASIX) 40 MG tablet Take 40 mg by mouth daily as needed for fluid.     [provider]  glucagon (GLUCAGON EMERGENCY) 1 MG injection Inject 1 mg into the vein once as needed. 04/10/15   Cassandria Anger, MD  hydrALAZINE  (APRESOLINE) 25 MG tablet Take 1.5 tablets (37.5 mg total) by mouth 3 (three) times daily. 02/19/21   Arnoldo Lenis, MD  insulin glargine (LANTUS SOLOSTAR) 100 UNIT/ML Solostar Pen Inject 40 Units into the skin in the morning. Patient not taking: Reported on 02/19/2021 06/19/20   Murlean Iba, MD  isosorbide mononitrate (IMDUR) 30 MG 24 hr tablet Take 0.5 tablets (15 mg total) by mouth daily. Patient taking differently: Take 30 mg by mouth daily. 07/09/20 02/19/21  Strader, Fransisco Hertz, PA-C  levothyroxine (SYNTHROID) 175 MCG tablet Take 175 mcg by mouth daily before breakfast.  08/12/16   [provider]  metoprolol succinate (TOPROL-XL) 25 MG 24 hr tablet TAKE 1/2 A TABLET BY MOUTH IN THE MORNING AND AT BEDTIME. 01/20/21   Branch, Alphonse Guild, MD  nitroGLYCERIN (NITROSTAT) 0.4 MG SL tablet Place 0.4 mg under the tongue every 5 (five) minutes x 3 doses as needed for chest pain.    [provider]  NOVOLOG FLEXPEN 100 UNIT/ML FlexPen Inject into the skin. 09/24/20   [provider]  pantoprazole (PROTONIX) 40 MG tablet Take 40 mg by mouth daily as needed (for acid reflux).     [provider]  potassium chloride SA (K-DUR,KLOR-CON) 20 MEQ tablet Take 20 mEq by mouth daily as needed (When taking Lasix (Furosemide)).     [provider]  SEMGLEE, YFGN, 100 UNIT/ML Pen SMARTSIG:0-80 Unit(s) SUB-Q Daily 01/26/21   [provider]                                                                                                                                    Past Surgical History Past Surgical History:  Procedure Laterality Date   Cardiac catherization     CHOLECYSTECTOMY     TUBAL LIGATION     Family History Family History  Problem Relation Age of Onset   Thyroid disease Mother    Hypertension Mother    Hyperlipidemia Mother    CAD Mother    CVA Mother    Hyperlipidemia Father    Hypertension Father    CAD Father    Breast cancer  Paternal Grandmother 82   Cancer Paternal Grandmother        lung    Social History Social History   Tobacco Use   Smoking status: Every Day  Packs/day: 0.25    Types: Cigarettes   Smokeless tobacco: Never  Vaping Use   Vaping Use: Never used  Substance Use Topics   Alcohol use: No    Alcohol/week: 0.0 standard drinks   Drug use: No   Allergies Wellbutrin [bupropion], Ciprofloxacin hcl, and Tape  Review of Systems Review of Systems  Cardiovascular:  Positive for leg swelling.  Skin:  Positive for wound.   Physical Exam Vital Signs  I have reviewed the triage vital signs BP 138/84 (BP Location: Right Arm)    Pulse 97    Temp 99.1 F (37.3 C) (Oral)    Resp 18    Ht 5\' 5"  (1.651 m)    Wt 98.9 kg    LMP 06/16/2021    SpO2 100%    BMI 36.28 kg/m   Physical Exam Vitals and nursing note reviewed.  Constitutional:      General: She is not in acute distress.    Appearance: She is well-developed.  HENT:     Head: Normocephalic and atraumatic.  Eyes:     Conjunctiva/sclera: Conjunctivae normal.  Cardiovascular:     Rate and Rhythm: Normal rate and regular rhythm.     Heart sounds: No murmur heard. Pulmonary:     Effort: Pulmonary effort is normal. No respiratory distress.     Breath sounds: Normal breath sounds.  Abdominal:     Palpations: Abdomen is soft.     Tenderness: There is no abdominal tenderness.  Musculoskeletal:        General: Tenderness present. No swelling.     Cervical back: Neck supple.  Skin:    General: Skin is warm and dry.     Capillary Refill: Capillary refill takes less than 2 seconds.     Findings: Lesion present.  Neurological:     Mental Status: She is alert.  Psychiatric:        Mood and Affect: Mood normal.         ED Results and Treatments Labs (all labs ordered are listed, but only abnormal results are displayed) Labs Reviewed  CBC WITH DIFFERENTIAL/PLATELET - Abnormal; Notable for the following components:      Result  Value   WBC 12.7 (*)    Neutro Abs 10.0 (*)    All other components within normal limits  COMPREHENSIVE METABOLIC PANEL - Abnormal; Notable for the following components:   CO2 18 (*)    Glucose, Bld 136 (*)    BUN 38 (*)    Creatinine, Ser 3.06 (*)    Calcium 8.1 (*)    Total Protein 5.8 (*)    Albumin 3.1 (*)    GFR, Estimated 19 (*)    All other components within normal limits  SEDIMENTATION RATE - Abnormal; Notable for the following components:   Sed Rate 33 (*)    All other components within normal limits  LACTIC ACID, PLASMA  C-REACTIVE PROTEIN  Radiology DG Foot Complete Left  Result Date: 06/18/2021 CLINICAL DATA:  Diabetes, wound to plantar surface of left foot for months, concern for osteomyelitis EXAM: LEFT FOOT - COMPLETE 3+ VIEW COMPARISON:  Foot radiographs 04/22/2010 FINDINGS: There is lucency and swelling in the soft tissues along the lateral aspect of the fifth metatarsal/MTP joint likely corresponding to the reported wound. There is no destruction or erosion of the osseous bone to suggest definite osteomyelitis. There is no acute fracture or dislocation. Alignment is normal. The Lisfranc and Chopart joints are intact. Vascular calcifications are noted. IMPRESSION: Lucency and swelling in the soft tissues along the lateral aspect of the fifth metatarsal/MTP joint likely corresponding to the reported wound. There is no osseous destruction to suggest definite osteomyelitis, though if there is high clinical concern, MRI is recommended. Electronically Signed   By: Valetta Mole M.D.   On: 06/18/2021 07:56    Pertinent labs & imaging results that were available during my care of the patient were reviewed by me and considered in my medical decision making (see MDM for details).  Medications Ordered in ED Medications  lactated ringers bolus 1,000 mL (1,000 mLs  Intravenous New Bag/Given 06/18/21 0849)  acetaminophen (TYLENOL) tablet 1,000 mg (1,000 mg Oral Given 06/18/21 6195)                                                                                                                                     Procedures Procedures  (including critical care time)  Medical Decision Making / ED Course   This patient presents to the ED for concern of foot pain and swelling, this involves an extensive number of treatment options, and is a complaint that carries with it a high risk of complications and morbidity.  The differential diagnosis includes cellulitis, osteomyelitis, diabetic foot wound, fracture  MDM: Patient seen emergency department for evaluation of foot pain and swelling.  Physical exam reveals a large lesion to the lateral aspect of the left foot in the area of a callus with active drainage coming from the wound.  Laboratory evaluation with a leukocytosis of 12.7, BUN elevated to 38, creatinine 3.06 which is up from a baseline of 1.9.  Lactic acid is normal, sed rate elevated at 33.  X-ray with no overt evidence of osteomyelitis, but cannot rule out with out MRI.  Patient started on fluid resuscitation for her acute kidney injury and vancomycin/cefepime for her diabetic foot wound.  Patient then admitted to hospitalist service   Additional history obtained:  -External records from outside source obtained and reviewed including: Chart review including previous notes, labs, imaging, consultation notes   Lab Tests: -I ordered, reviewed, and interpreted labs.   The pertinent results include:   Labs Reviewed  CBC WITH DIFFERENTIAL/PLATELET - Abnormal; Notable for the following components:      Result Value   WBC 12.7 (*)    Neutro Abs 10.0 (*)  All other components within normal limits  COMPREHENSIVE METABOLIC PANEL - Abnormal; Notable for the following components:   CO2 18 (*)    Glucose, Bld 136 (*)    BUN 38 (*)    Creatinine, Ser  3.06 (*)    Calcium 8.1 (*)    Total Protein 5.8 (*)    Albumin 3.1 (*)    GFR, Estimated 19 (*)    All other components within normal limits  SEDIMENTATION RATE - Abnormal; Notable for the following components:   Sed Rate 33 (*)    All other components within normal limits  LACTIC ACID, PLASMA  C-REACTIVE PROTEIN      Imaging Studies ordered: I ordered imaging studies including XR foot I independently visualized and interpreted imaging. I agree with the radiologist interpretation   Medicines ordered and prescription drug management: Meds ordered this encounter  Medications   lactated ringers bolus 1,000 mL   acetaminophen (TYLENOL) tablet 1,000 mg    -I have reviewed the patients home medicines and have made adjustments as needed  Critical interventions none  Cardiac Monitoring: The patient was maintained on a cardiac monitor.  I personally viewed and interpreted the cardiac monitored which showed an underlying rhythm of: NSR  Social Determinants of Health:  Factors impacting patients care include: Had a discussion about patient's insurance, insurance now capable of providing appropriate care   Reevaluation: After the interventions noted above, I reevaluated the patient and found that they have :improved  Co morbidities that complicate the patient evaluation  Past Medical History:  Diagnosis Date   Anemia    CAD (coronary artery disease)    a. s/p cath in 03/2014 showing 30% mid-LAD, moderate to severe disease along small D1, patent LCx, moderate to severe distal OM2 stenosis and moderate diffuse diease along RCA not amenable to PCI   CHF (congestive heart failure) ([place])    a. EF 55-60% in 12/2019 b. EF at 35-40% by echo in 05/2020   Diabetes mellitus without complication ([place])    Myocardial infarction ([place])    Neuropathy    Renal disorder    stage 3 kidney disease   Stroke [facility])       Dispostion: I considered admission for this patient, and due to acute  kidney injury and diabetic foot wound patient will be admitted     Final Clinical Impression(s) / ED Diagnoses Final diagnoses:  None     @PCDICTATION @    Teressa Lower, MD 06/18/21 1001

## 2021-06-18 NOTE — ED Triage Notes (Signed)
Pt c/o wound to plantar surface of left foot x months. Pt reports she saw her PCP and he told her it was a callose and to soak it in "acid" and then shave it down. Pt reports after she started that treatment it started hurting. Pt is diabetic and reports her blood sugars have been normal to low recently.

## 2021-06-18 NOTE — H&P (Signed)
History and Physical    Patient: Lauren [name] XAJ:[id number] DOB: 1980/10/13 DOA: 06/18/2021 DOS: the patient was seen and examined on 06/18/2021 PCP: Lemmie Evens, MD  Patient coming from: Home  Chief Complaint:  Chief Complaint  Patient presents with   Wound Check    HPI: Lauren [name] is a 41 y.o. female with medical history significant of DM type 1, CHF, CKD 3, CAD, CVA, presents with painful sore on left foot. Reports that she has had a callus on her left foot for several months now. Over the last day or 2, this has become swollen, painful and she has noted redness over her left foot. She  has not had fevers otherwise. She has noted some drainage from wound, but feels it may not be purulent. She has not had any vomiting, diarrhea, cough, shortness of breath.   Review of Systems: As mentioned in the history of present illness. All other systems reviewed and are negative. Past Medical History:  Diagnosis Date   Anemia    CAD (coronary artery disease)    a. s/p cath in 03/2014 showing 30% mid-LAD, moderate to severe disease along small D1, patent LCx, moderate to severe distal OM2 stenosis and moderate diffuse diease along RCA not amenable to PCI   CHF (congestive heart failure) ([place])    a. EF 55-60% in 12/2019 b. EF at 35-40% by echo in 05/2020   Diabetes mellitus without complication ([place])    Myocardial infarction ([place])    Neuropathy    Renal disorder    stage 3 kidney disease   Stroke [facility])    Past Surgical History:  Procedure Laterality Date   Cardiac catherization     CHOLECYSTECTOMY     TUBAL LIGATION     Social History:  reports that she has been smoking cigarettes. She has been smoking an average of .25 packs per day. She has never used smokeless tobacco. She reports that she does not drink alcohol and does not use drugs.  Allergies  Allergen Reactions   Wellbutrin [Bupropion] Hives   Ciprofloxacin Hcl Hives and Rash    Hives/rash at injection site    Tape  Rash    Family History  Problem Relation Age of Onset   Thyroid disease Mother    Hypertension Mother    Hyperlipidemia Mother    CAD Mother    CVA Mother    Hyperlipidemia Father    Hypertension Father    CAD Father    Breast cancer Paternal Grandmother 33   Cancer Paternal Grandmother        lung    Prior to Admission medications   Medication Sig Start Date End Date Taking? Authorizing Provider  atorvastatin (LIPITOR) 40 MG tablet TAKE 1 TABLET BY MOUTH ONCE DAILY. 09/24/20  Yes Arnoldo Lenis, MD  clopidogrel (PLAVIX) 75 MG tablet TAKE 1 TABLET BY MOUTH ONCE DAILY. 03/23/21  Yes BranchAlphonse Guild, MD  Continuous Blood Gluc Sensor (DEXCOM G6 SENSOR) MISC SMARTSIG:1 Each Topical Every 10 Days 06/30/20  Yes [provider]  Continuous Blood Gluc Transmit (DEXCOM G6 TRANSMITTER) MISC  06/30/20  Yes [provider]  fenofibrate 160 MG tablet TAKE 1 TABLET BY MOUTH ONCE DAILY. 10/01/20  Yes Branch, Alphonse Guild, MD  furosemide (LASIX) 40 MG tablet Take 40 mg by mouth daily as needed for fluid.    Yes [provider]  glucagon (GLUCAGON EMERGENCY) 1 MG injection Inject 1 mg into the vein once as needed. 04/10/15  Yes Nida,  Marella Chimes, MD  levothyroxine (SYNTHROID) 175 MCG tablet Take 175 mcg by mouth daily before breakfast.  08/12/16  Yes [provider]  metoprolol succinate (TOPROL-XL) 25 MG 24 hr tablet TAKE 1/2 A TABLET BY MOUTH IN THE MORNING AND AT BEDTIME. Patient taking differently: Take 25 mg by mouth daily. 01/20/21  Yes Branch, Alphonse Guild, MD  nitroGLYCERIN (NITROSTAT) 0.4 MG SL tablet Place 0.4 mg under the tongue every 5 (five) minutes x 3 doses as needed for chest pain.   Yes [provider]  NOVOLOG FLEXPEN 100 UNIT/ML FlexPen Inject 2-10 Units into the skin 3 (three) times daily with meals. 09/24/20  Yes [provider]  pantoprazole (PROTONIX) 40 MG tablet Take 40 mg by mouth daily as needed (for acid reflux).    Yes  [provider]  potassium chloride SA (K-DUR,KLOR-CON) 20 MEQ tablet Take 20 mEq by mouth daily as needed (When taking Lasix (Furosemide)).    Yes [provider]  SEMGLEE, YFGN, 100 UNIT/ML Pen 35 Units 2 (two) times daily. 01/26/21  Yes [provider]  cephALEXin (KEFLEX) 500 MG capsule Take 1 capsule (500 mg total) by mouth 2 (two) times daily. Patient not taking: Reported on 06/18/2021 02/21/21   Ripley Fraise, MD  ferrous sulfate 325 (65 FE) MG tablet Take 1 tablet (325 mg total) by mouth daily with breakfast. Patient not taking: Reported on 06/18/2021 06/20/20   Murlean Iba, MD  hydrALAZINE (APRESOLINE) 25 MG tablet Take 1.5 tablets (37.5 mg total) by mouth 3 (three) times daily. Patient not taking: Reported on 06/18/2021 02/19/21   Arnoldo Lenis, MD  insulin glargine (LANTUS SOLOSTAR) 100 UNIT/ML Solostar Pen Inject 40 Units into the skin in the morning. Patient not taking: Reported on 02/19/2021 06/19/20   Murlean Iba, MD  isosorbide mononitrate (IMDUR) 30 MG 24 hr tablet Take 0.5 tablets (15 mg total) by mouth daily. Patient taking differently: Take 30 mg by mouth daily. 07/09/20 02/19/21  Erma Heritage, PA-C    Physical Exam: Vitals:   06/18/21 1340 06/18/21 1602 06/18/21 1641 06/18/21 1659  BP: 135/82  (!) 150/82   Pulse: 96 94 91   Resp: 18 16 18    Temp: 98.9 F (37.2 C)  98.2 F (36.8 C)   TempSrc: Oral  Oral   SpO2: 99% 99% 100%   Weight:    98.8 kg  Height:    5\' 5"  (1.651 m)   General exam: Alert, awake, oriented x 3 Respiratory system: Clear to auscultation. Respiratory effort normal. Cardiovascular system:RRR. No murmurs, rubs, gallops. Gastrointestinal system: Abdomen is nondistended, soft and nontender. No organomegaly or masses felt. Normal bowel sounds heard. Central nervous system: Alert and oriented. No focal neurological deficits. Extremities: left foot with large callus over lateral plantar aspect of fore  foot Skin: No rashes, lesions or ulcers Psychiatry: Judgement and insight appear normal. Mood & affect appropriate.    Data Reviewed:  Reviewed admission labs including chemistry, cbc and foot xray  Assessment and Plan: * Cellulitis- (present on admission) Started on vancomycin and cefepime Checking MRI foot to evaluate for underlying osteomyelitis Will likely need referral to podiatry on discharge  History of CVA (cerebrovascular accident) Continue on plavix  Chronic combined systolic and diastolic CHF (congestive heart failure) (HCC) EF 35-40% with grade 3 DD Appears compensated She reports that she only uses lasix prn and has not required it in quite some time Continue to follow volume status closely in the setting of hydration  CAD (coronary artery disease)- (present on admission) Previous cardiac studies did not reveal large vessel disease necessitating intervention Continue medical management  Class 2 obesity- (present on admission) Will need lifestyle adjustments  Mixed hyperlipidemia- (present on admission) Continue statin  Acute renal failure superimposed on stage 3a chronic kidney disease ([place])- (present on admission) Possibly related to volume depletion in the setting of infection Continue hydration overnight and recheck labs in am  Essential hypertension- (present on admission) Continued on metoprolol Home meds indicate that she is not taking hydralazine at home Can consider restarting hydralazine if blood pressures trend up  Hypothyroidism- (present on admission) Continue synthroid  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 Observation status is judged to  be reasonable and necessary in order to provide the required intensity of service to ensure the patient's safety. The patient's presenting symptoms, physical exam findings, and initial radiographic and laboratory data in the context of their medical condition is felt to place them at decreased risk for further clinical deterioration. Furthermore, it is anticipated that the patient will be medically stable for discharge from the hospital within 2 midnights of admission.   Author: Kathie Dike, MD 06/18/2021 8:42 PM  For on call review [email or web address].

## 2021-06-18 NOTE — Assessment & Plan Note (Signed)
Continue statin. 

## 2021-06-18 NOTE — Assessment & Plan Note (Addendum)
Continued on metoprolol, hydralazine

## 2021-06-18 NOTE — ED Notes (Signed)
Report called to unit. 

## 2021-06-18 NOTE — Progress Notes (Addendum)
Pharmacy Antibiotic Note  Lauren [name] is a 41 y.o. female admitted on 06/18/2021 with  cellulitis/diabetic foot infection .  Pharmacy has been consulted for vancomycin and cefepime dosing.  Plan: Vancomycin 2000 mg IV x 1 dose. Vancomycin 1250 mg IV every 48 hours. Cefepime 2000 mg IV x 24 hours.  Monitor labs, c/s, and vanco level as indicated.  Height: 5\' 5"  (165.1 cm) Weight: 98.9 kg (218 lb) IBW/kg (Calculated) : 57  Temp (24hrs), Avg:99.1 F (37.3 C), Min:99.1 F (37.3 C), Max:99.1 F (37.3 C)  Recent Labs  Lab 06/18/21 0750 06/18/21 0846  WBC 12.7*  --   CREATININE 3.06*  --   LATICACIDVEN  --  0.6    Estimated Creatinine Clearance: 29 mL/min (A) (by C-G formula based on SCr of 3.06 mg/dL (H)).    Allergies  Allergen Reactions   Wellbutrin [Bupropion] Hives   Ciprofloxacin Hcl Hives and Rash    Hives/rash at injection site    Tape Rash    Antimicrobials this admission: Vanco 2/16 >> Cefepime 2/16 >>  Microbiology results: None pending  Thank you for allowing pharmacy to be a part of this patients care.  Margot Ables, PharmD Clinical Pharmacist 06/18/2021 11:58 AM

## 2021-06-18 NOTE — Assessment & Plan Note (Signed)
Previous cardiac studies did not reveal large vessel disease necessitating intervention Continue medical management

## 2021-06-18 NOTE — ED Notes (Signed)
Patient is sleeping - unable to obtain vitals

## 2021-06-18 NOTE — Assessment & Plan Note (Addendum)
Baseline creatinine approximately 2 Admission creatinine 3.06 She received IV fluids with improvement of creatinine to 2.2

## 2021-06-19 DIAGNOSIS — L0390 Cellulitis, unspecified: Secondary | ICD-10-CM

## 2021-06-19 LAB — HEMOGLOBIN A1C
Hgb A1c MFr Bld: 9.8 % — ABNORMAL HIGH (ref 4.8–5.6)
Mean Plasma Glucose: 234.56 mg/dL

## 2021-06-19 LAB — COMPREHENSIVE METABOLIC PANEL
ALT: 17 U/L (ref 0–44)
AST: 14 U/L — ABNORMAL LOW (ref 15–41)
Albumin: 2.5 g/dL — ABNORMAL LOW (ref 3.5–5.0)
Alkaline Phosphatase: 72 U/L (ref 38–126)
Anion gap: 7 (ref 5–15)
BUN: 30 mg/dL — ABNORMAL HIGH (ref 6–20)
CO2: 17 mmol/L — ABNORMAL LOW (ref 22–32)
Calcium: 8 mg/dL — ABNORMAL LOW (ref 8.9–10.3)
Chloride: 117 mmol/L — ABNORMAL HIGH (ref 98–111)
Creatinine, Ser: 2.25 mg/dL — ABNORMAL HIGH (ref 0.44–1.00)
GFR, Estimated: 28 mL/min — ABNORMAL LOW (ref >60)
Glucose, Bld: 81 mg/dL (ref 70–99)
Potassium: 4.1 mmol/L (ref 3.5–5.1)
Sodium: 141 mmol/L (ref 135–145)
Total Bilirubin: 0.3 mg/dL (ref 0.3–1.2)
Total Protein: 5.1 g/dL — ABNORMAL LOW (ref 6.5–8.1)

## 2021-06-19 LAB — CBC
HCT: 37.7 % (ref 36.0–46.0)
Hemoglobin: 12 g/dL (ref 12.0–15.0)
MCH: 30.3 pg (ref 26.0–34.0)
MCHC: 31.8 g/dL (ref 30.0–36.0)
MCV: 95.2 fL (ref 80.0–100.0)
Platelets: 270 10*3/uL (ref 150–400)
RBC: 3.96 MIL/uL (ref 3.87–5.11)
RDW: 12.7 % (ref 11.5–15.5)
WBC: 7.3 10*3/uL (ref 4.0–10.5)
nRBC: 0 % (ref 0.0–0.2)

## 2021-06-19 LAB — GLUCOSE, CAPILLARY
Glucose-Capillary: 52 mg/dL — ABNORMAL LOW (ref 70–99)
Glucose-Capillary: 116 mg/dL — ABNORMAL HIGH (ref 70–99)
Glucose-Capillary: 225 mg/dL — ABNORMAL HIGH (ref 70–99)

## 2021-06-19 LAB — HIV ANTIBODY (ROUTINE TESTING W REFLEX): HIV Screen 4th Generation wRfx: NONREACTIVE

## 2021-06-19 MED ORDER — AMOXICILLIN-POT CLAVULANATE 875-125 MG PO TABS: 1.0000 | ORAL_TABLET | Freq: Two times a day (BID) | ORAL | 0 refills | Status: AC

## 2021-06-19 MED ORDER — SODIUM CHLORIDE 0.9 % IV SOLN: 2.0000 g | Freq: Two times a day (BID) | INTRAVENOUS | Status: DC

## 2021-06-19 MED ORDER — DOXYCYCLINE HYCLATE 100 MG PO CAPS
100.0000 mg | ORAL_CAPSULE | Freq: Two times a day (BID) | ORAL | 0 refills | Status: DC
Start: 2021-06-19 — End: 2021-09-05

## 2021-06-19 MED ORDER — VANCOMYCIN HCL IN DEXTROSE 1-5 GM/200ML-% IV SOLN
1000.0000 mg | INTRAVENOUS | Status: DC
  Filled 2021-06-19: qty 200

## 2021-06-19 NOTE — Progress Notes (Signed)
Patient discharged home today, transported home by family. Discharge paperwork went over with patient, patient verbalized understanding. Belongings sent home with patient.  ?

## 2021-06-19 NOTE — Progress Notes (Signed)
°  Transition of Care [facility]) Screening Note   Patient Details  Name: Lauren [name] Date of Birth: 12-27-1980   Transition of Care [facility]) CM/SW Contact:    Ihor Gully, LCSW Phone Number: 06/19/2021, 12:35 PM    Transition of Care Department [facility]) has reviewed patient and no TOC needs have been identified at this time. We will continue to monitor patient advancement through interdisciplinary progression rounds. If new patient transition needs arise, please place a TOC consult.   Adleigh Mcmasters, Clydene Pugh, LCSW

## 2021-06-19 NOTE — Progress Notes (Addendum)
Patient reported to this writer that she gave herself insulin this morning with breakfast. Patient stated she knows her blood glucose and checks it, if she did not administer the insulin it would be elevated later. Patient stated the insulin was given before she received long acting insulin, see MAR. Patient educated that staff will administer insulin. Made MD Memon aware. Per MD Memon give 1200 dose.

## 2021-06-19 NOTE — Hospital Course (Signed)
41 y/o female with history DM type 1, CKD, CAD, CVA, CHF, admitted to hospital with left foot infection. Imaging including plain films and MRI foot negative for osteomyelitis and abscess. Initially started on IV antibiotics and subsequently transitioned to po course. Referral has been made to podiatry. She has been advised to monitor for worsening erythema, swelling or fever.  She was also found to have AKI on CKD. Renal function improved with IV fluids and is now approaching baseline

## 2021-06-19 NOTE — Discharge Summary (Signed)
Physician Discharge Summary   Patient: Lauren [name] MRN: [id number] DOB: May 20, 1980  Admit date:     06/18/2021  Discharge date: 06/19/21  Discharge Physician: Kathie Dike   PCP: Lemmie Evens, MD   Recommendations at discharge:    Outpatient referral made to podiatry Outpatient referral made to endocrinology She has follow up with Dr. 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 *   Hospital Course: 41 y/o female with history DM type 1, CKD, CAD, CVA, CHF, admitted to hospital with left foot infection. Imaging including plain films and MRI foot negative for osteomyelitis and abscess. Initially started on IV antibiotics and subsequently transitioned to po course. Referral has been made to podiatry. She has been advised to monitor for worsening erythema, swelling or fever.  She was also found to have AKI on CKD. Renal function improved with IV fluids and is now approaching baseline  Assessment and Plan: * Cellulitis- (present on admission) Started on vancomycin and cefepime MRI foot negative for osteomyelitis or abscess Antibiotics transitioned to oral doxycycline and augmentin Outpatient referral to podiatry made  History of CVA (cerebrovascular accident) Continue on plavix  Chronic combined systolic and diastolic CHF (congestive heart failure) (HCC) EF 35-40% with grade 3 DD Appears compensated She reports that she only uses lasix prn and has not  required it in quite some time   CAD (coronary artery disease)- (present on admission) Previous cardiac studies did not reveal large vessel disease necessitating intervention Continue medical management  Class 2 obesity- (present on admission) Will need lifestyle adjustments  Mixed hyperlipidemia- (present on admission) Continue statin  Acute renal failure superimposed on stage 3a chronic kidney disease ([place])- (present on admission) Baseline creatinine approximately 2 Admission creatinine 3.06 She received IV fluids with improvement of creatinine to 2.2   Essential hypertension- (present on admission) Continued on metoprolol, hydralazine  Hypothyroidism- (present on admission) Continue synthroid  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[place])- (present on admission) Continue home dose of basal insulin and supplement with SSI Outpatient referral to endocrinology made on discharge A1c 9.8           Consultants:  Procedures performed:   Disposition: Home Diet recommendation:  Discharge Diet Orders (From admission, onward)     Start     Ordered   06/19/21 0000  Diet - low sodium heart healthy        06/19/21 1332           Cardiac and Carb modified diet  DISCHARGE MEDICATION: Allergies as of 06/19/2021       Reactions   Wellbutrin [bupropion] Hives   Ciprofloxacin Hcl Hives, Rash   Hives/rash at injection site    Tape Rash        Medication List     STOP taking these medications    cephALEXin 500 MG capsule Commonly known as: Keflex   ferrous sulfate 325 (65 FE) MG tablet   Lantus SoloStar 100 UNIT/ML Solostar Pen Generic  drug: insulin glargine       TAKE these medications    amoxicillin-clavulanate 875-125 MG tablet Commonly known as: Augmentin Take 1 tablet by mouth 2 (two) times daily for 7 days.   atorvastatin 40 MG tablet Commonly known as: LIPITOR TAKE 1 TABLET BY MOUTH ONCE DAILY.   clopidogrel 75 MG tablet Commonly  known as: PLAVIX TAKE 1 TABLET BY MOUTH ONCE DAILY.   Dexcom G6 Sensor Misc SMARTSIG:1 Each Topical Every 10 Days   Dexcom G6 Transmitter Misc   doxycycline 100 MG capsule Commonly known as: VIBRAMYCIN Take 1 capsule (100 mg total) by mouth 2 (two) times daily.   fenofibrate 160 MG tablet TAKE 1 TABLET BY MOUTH ONCE DAILY.   furosemide 40 MG tablet Commonly known as: LASIX Take 40 mg by mouth daily as needed for fluid.   glucagon 1 MG injection Inject 1 mg into the vein once as needed.   hydrALAZINE 25 MG tablet Commonly known as: APRESOLINE Take 1.5 tablets (37.5 mg total) by mouth 3 (three) times daily.   isosorbide mononitrate 30 MG 24 hr tablet Commonly known as: IMDUR Take 0.5 tablets (15 mg total) by mouth daily. What changed: how much to take   levothyroxine 175 MCG tablet Commonly known as: SYNTHROID Take 175 mcg by mouth daily before breakfast.   metoprolol succinate 25 MG 24 hr tablet Commonly known as: TOPROL-XL TAKE 1/2 A TABLET BY MOUTH IN THE MORNING AND AT BEDTIME. What changed: See the new instructions.   nitroGLYCERIN 0.4 MG SL tablet Commonly known as: NITROSTAT Place 0.4 mg under the tongue every 5 (five) minutes x 3 doses as needed for chest pain.   NovoLOG FlexPen 100 UNIT/ML FlexPen Generic drug: insulin aspart Inject 2-10 Units into the skin 3 (three) times daily with meals.   pantoprazole 40 MG tablet Commonly known as: PROTONIX Take 40 mg by mouth daily as needed (for acid reflux).   potassium chloride SA 20 MEQ tablet Commonly known as: KLOR-CON M Take 20 mEq by mouth daily as needed (When taking Lasix (Furosemide)).   Semglee (yfgn) 100 UNIT/ML Pen Generic drug: insulin glargine-yfgn 35 Units 2 (two) times daily.         Discharge Exam: Filed Weights   06/18/21 0722 06/18/21 1659  Weight: 98.9 kg 98.8 kg   General exam: Alert, awake, oriented x 3 Respiratory system: Clear to auscultation. Respiratory effort  normal. Cardiovascular system:RRR. No murmurs, rubs, gallops. Gastrointestinal system: Abdomen is nondistended, soft and nontender. No organomegaly or masses felt. Normal bowel sounds heard. Central nervous system: Alert and oriented. No focal neurological deficits. Extremities: mild erythema over left foot, no drainage noted from left foot callus. Developing edema in b/l LE  Skin: No rashes, lesions or ulcers Psychiatry: Judgement and insight appear normal. Mood & affect appropriate.    Condition at discharge: good  The results of significant diagnostics from this hospitalization (including imaging, microbiology, ancillary and laboratory) are listed below for reference.   Imaging Studies: MR FOOT LEFT WO CONTRAST  Result Date: 06/18/2021 CLINICAL DATA:  Foot swelling, nondiabetic, osteomyelitis suspected EXAM: MRI OF THE LEFT FOOT WITHOUT CONTRAST TECHNIQUE: Multiplanar, multisequence MR imaging of the left foot was performed. No intravenous contrast was administered. COMPARISON:  Radiograph 06/18/2021 FINDINGS: Bones/Joint/Cartilage The cortex is intact. There is no significant marrow signal alteration. Ligaments Intact Lisfranc ligament.  Intact collateral ligaments. Muscles and Tendons Diffuse intramuscular edema and atrophy of the foot as is commonly seen in diabetics. Soft tissues Diffuse soft  tissue swelling in the forefoot. There is a soft tissue wound in the lateral forefoot adjacent to the fifth MTP joint, with skin thickening and focal swelling. These are a sliver of fluid measuring 1-2 mm thick just deep to this, likely reactive adventitial bursitis. IMPRESSION: No evidence of osteomyelitis. Soft tissue wound in the lateral foot foot adjacent to the fifth MTP joint, with skin thickening and focal swelling. Sliver of fluid measuring 1-2 mm thick just deep to this, possibly draining through an open wound, correlate with exam. Electronically Signed   By: Maurine Simmering M.D.   On: 06/18/2021  15:19   DG Foot Complete Left  Result Date: 06/18/2021 CLINICAL DATA:  Diabetes, wound to plantar surface of left foot for months, concern for osteomyelitis EXAM: LEFT FOOT - COMPLETE 3+ VIEW COMPARISON:  Foot radiographs 04/22/2010 FINDINGS: There is lucency and swelling in the soft tissues along the lateral aspect of the fifth metatarsal/MTP joint likely corresponding to the reported wound. There is no destruction or erosion of the osseous bone to suggest definite osteomyelitis. There is no acute fracture or dislocation. Alignment is normal. The Lisfranc and Chopart joints are intact. Vascular calcifications are noted. IMPRESSION: Lucency and swelling in the soft tissues along the lateral aspect of the fifth metatarsal/MTP joint likely corresponding to the reported wound. There is no osseous destruction to suggest definite osteomyelitis, though if there is high clinical concern, MRI is recommended. Electronically Signed   By: Valetta Mole M.D.   On: 06/18/2021 07:56    Microbiology: Results for orders placed or performed during the hospital encounter of 06/18/21  Resp Panel by RT-PCR (Flu A&B, Covid) Nasopharyngeal Swab     Status: None   Collection Time: 06/18/21  2:08 PM   Specimen: Nasopharyngeal Swab; Nasopharyngeal(NP) swabs in vial transport medium  Result Value Ref Range Status   SARS Coronavirus 2 by RT PCR NEGATIVE NEGATIVE Final    Comment: (NOTE) SARS-CoV-2 target nucleic acids are NOT DETECTED.  The SARS-CoV-2 RNA is generally detectable in upper respiratory specimens during the acute phase of infection. The lowest concentration of SARS-CoV-2 viral copies this assay can detect is 138 copies/mL. A negative result does not preclude SARS-Cov-2 infection and should not be used as the sole basis for treatment or other patient management decisions. A negative result may occur with  improper specimen collection/handling, submission of specimen other than nasopharyngeal swab, presence of  viral mutation(s) within the areas targeted by this assay, and inadequate number of viral copies(<138 copies/mL). A negative result must be combined with clinical observations, patient history, and epidemiological information. The expected result is Negative.  Fact Sheet for Patients:  EntrepreneurPulse.com.au  Fact Sheet for Healthcare Providers:  IncredibleEmployment.be  This test is no t yet approved or cleared by the [place] FDA and  has been authorized for detection and/or diagnosis of SARS-CoV-2 by FDA under an Emergency Use Authorization (EUA). This EUA will remain  in effect (meaning this test can be used) for the duration of the COVID-19 declaration under Section 564(b)(1) of the Act, 21 U.S.C.section 360bbb-3(b)(1), unless the authorization is terminated  or revoked sooner.       Influenza A by PCR NEGATIVE NEGATIVE Final   Influenza B by PCR NEGATIVE NEGATIVE Final    Comment: (NOTE) The Xpert Xpress SARS-CoV-2/FLU/RSV plus assay is intended as an aid in the diagnosis of influenza from Nasopharyngeal swab specimens and should not be used as a sole basis for treatment. Nasal washings and aspirates are unacceptable  for Xpert Xpress SARS-CoV-2/FLU/RSV testing.  Fact Sheet for Patients: EntrepreneurPulse.com.au  Fact Sheet for Healthcare Providers: IncredibleEmployment.be  This test is not yet approved or cleared by the [place] FDA and has been authorized for detection and/or diagnosis of SARS-CoV-2 by FDA under an Emergency Use Authorization (EUA). This EUA will remain in effect (meaning this test can be used) for the duration of the COVID-19 declaration under Section 564(b)(1) of the Act, 21 U.S.C. section 360bbb-3(b)(1), unless the authorization is terminated or revoked.  Performed at [facility], [address] [place], [place] [address]     Labs: CBC: Recent Labs  Lab  06/18/21 0750 06/19/21 0407  WBC 12.7* 7.3  NEUTROABS 10.0*  --   HGB 12.2 12.0  HCT 37.8 37.7  MCV 92.2 95.2  PLT 292 621   Basic Metabolic Panel: Recent Labs  Lab 06/18/21 0750 06/19/21 0407  NA 135 141  K 3.6 4.1  CL 105 117*  CO2 18* 17*  GLUCOSE 136* 81  BUN 38* 30*  CREATININE 3.06* 2.25*  CALCIUM 8.1* 8.0*   Liver Function Tests: Recent Labs  Lab 06/18/21 0750 06/19/21 0407  AST 23 14*  ALT 24 17  ALKPHOS 80 72  BILITOT 0.6 0.3  PROT 5.8* 5.1*  ALBUMIN 3.1* 2.5*   CBG: Recent Labs  Lab 06/18/21 1716 06/18/21 2226 06/19/21 0729 06/19/21 0806 06/19/21 1118  GLUCAP 92 229* 52* 116* 225*    Discharge time spent: greater than 30 minutes.  Signed: Kathie Dike, MD [facility] 06/19/2021

## 2021-06-19 NOTE — Progress Notes (Signed)
Hypoglycemic Event  CBG: 52 mg/dl  Treatment: 8 oz juice/soda  Symptoms: None  Follow-up CBG: Time:0806 CBG Result:116  Possible Reasons for Event: Unknown  Comments/MD notified: MD Memon made aware via Amion.     Ginger Organ

## 2021-06-19 NOTE — Progress Notes (Signed)
Inpatient Diabetes Program Recommendations  AACE/ADA: New Consensus Statement on Inpatient Glycemic Control   Target Ranges:  Prepandial:   less than 140 mg/dL      Peak postprandial:   less than 180 mg/dL (1-2 hours)      Critically ill patients:  140 - 180 mg/dL    Latest Reference Range & Units 06/18/21 16:44 06/18/21 17:16 06/18/21 22:26 06/19/21 07:29 06/19/21 08:06  Glucose-Capillary 70 - 99 mg/dL 52 (L) 92 229 (H) 52 (L) 116 (H)    Review of Glycemic Control  Diabetes history: DM1 Outpatient Diabetes medications: Semglee 35 units BID, Novolog 2-10 units TID with meals Current orders for Inpatient glycemic control: Semglee 35 units BID, Novolog 0-9 units TID with meals, Novolog 0-5 units QHS  Inpatient Diabetes Program Recommendations:    Insulin: Please consider changing Semglee to 30 units QHS.  NOTE: Patient received Semglee 35 units at 16:54 and Semglee 35 units at 22:27 on 06/18/21. Glucose this morning is 52 mg/dl this morning.  Thanks, Barnie Alderman, RN, MSN, CDE Diabetes Coordinator Inpatient Diabetes Program [phone] (Team Pager from 8am to 5pm)

## 2021-06-25 ENCOUNTER — Encounter: Payer: Self-pay | Admitting: Cardiology

## 2021-06-25 ENCOUNTER — Ambulatory Visit: Payer: No Typology Code available for payment source | Admitting: Cardiology

## 2021-06-25 VITALS — BP 118/80 | HR 104 | Ht 65.0 in | Wt 215.2 lb

## 2021-06-25 DIAGNOSIS — I10 Essential (primary) hypertension: Secondary | ICD-10-CM | POA: Diagnosis not present

## 2021-06-25 DIAGNOSIS — E782 Mixed hyperlipidemia: Secondary | ICD-10-CM | POA: Diagnosis not present

## 2021-06-25 DIAGNOSIS — I5022 Chronic systolic (congestive) heart failure: Secondary | ICD-10-CM | POA: Diagnosis not present

## 2021-06-25 DIAGNOSIS — I2510 Atherosclerotic heart disease of native coronary artery without angina pectoris: Secondary | ICD-10-CM

## 2021-06-25 MED ORDER — ISOSORBIDE MONONITRATE ER 30 MG PO TB24
30.0000 mg | ORAL_TABLET | Freq: Every day | ORAL | 1 refills | Status: DC
Start: 2021-06-25 — End: 2021-09-24

## 2021-06-25 MED ORDER — EMPAGLIFLOZIN 10 MG PO TABS: 10.0000 mg | ORAL_TABLET | Freq: Every day | ORAL | 0 refills | Status: DC

## 2021-06-25 MED ORDER — EMPAGLIFLOZIN 10 MG PO TABS: 10.0000 mg | ORAL_TABLET | Freq: Every day | ORAL | 1 refills | Status: DC

## 2021-06-25 MED ORDER — FUROSEMIDE 40 MG PO TABS: 40.0000 mg | ORAL_TABLET | Freq: Every day | ORAL | 1 refills | Status: DC | PRN

## 2021-06-25 MED ORDER — POTASSIUM CHLORIDE CRYS ER 20 MEQ PO TBCR: 20.0000 meq | EXTENDED_RELEASE_TABLET | Freq: Every day | ORAL | 3 refills | Status: DC | PRN

## 2021-06-25 NOTE — Patient Instructions (Addendum)
Medication Instructions:  Your physician has recommended you make the following change in your medication:  Increase Imdur to 30 mg once a day Start Jardiance 10 mg once a day Continue medications as directed  Labwork: none  Testing/Procedures: none  Follow-Up: Your physician recommends that you schedule a follow-up appointment in: 4 months  Any Other Special Instructions Will Be Listed Below (If Applicable).  If you need a refill on your cardiac medications before your next appointment, please call your pharmacy.

## 2021-06-25 NOTE — Progress Notes (Signed)
Clinical Summary Ms. Stupka is a 41 y.o.female seen today for follow up of the following medical problems.    1. Chronic systolic HF Jan 8270 echo: LVEF 35-40%, grade III dd, apex akinetic - imdur lowered to 15mg  daily due to headaches  - side effects of dizziness on higher beta blocker dosing, titrate slowly - avoiding ACe/ARB/ARNI/aldactone given poor renal function. - headaches on higher imdur doses   - 03/2014 cath: LVEF >60%, mid LAD 30%, D1 mod to severe small vessel, distally pruned, distal 50-60% disease. LCX patent, OM2, mild disease with distal mod to severe, RCA diffuse moderate disease not ameanble to PCI - given renal function we have not repeated her cath despite drop in LVEF 02/2020 nuclear stress: large periapical defect consistent with scar, mild apical anterolateral ischemia.   Echo reviewed , LVEF 35-40% with apical akinesis. I reviewed the echo which I had read in 12/2019 and compared images side by side. 12/2019 LVEF likely an overestimate and closer to 40%, difficult assessment given the base and mid ventricle move well and main dysfunction is focused in the apex.      - no recent SOB/DOE, no LE edema - compliant ith meds. Has not had to take prn lasix in several months   Jan 2022 echo: LVEF 35-40%, apex akinetic - had some recent LE edema, had received IVFs during recent admission with cellulitis - was having some chest heaviness, leg swelling.  - took lasix 80mg  x 2 days with improved swelling.       2. HTN - she is compliant with med       3. CAD/ Elevated troponin 03/2014 Records from [facility] - admitted with DKA, found to have elevated troponins - echo with normal LVEF 65-70% - during stress test had new LBBB, referred for cath - 03/2014 cath: LVEF >60%, mid LAD 30%, D1 mod to severe small vessel, distally pruned, distal 50-60% disease. LCX patent, OM2, mild disease with distal mod to severe, RCA diffuse moderate disease not ameanble to PCI       -  admitted 01/2020 with CVA, iin this setting elevated trop to 413. EKG without acute ischemic changes - echo with some apical hypokinesis - workup deferred to allow her time to recover from stroke, also thought was this could be a stress induced CM   02/2020 nuclear stress: large periapical defect consistent with scar, mild apical anterolateral ischemia.     Jan 2022 echo LVEF 35-40%, grade III dd, apex akinetic       - no recent chest pain - no SOB/DOE - no recent edema. Has not had to take lasix in several months.          3. CKD - followed by pcp - reports seeing neprhology some time ago but not recently     4. History of CVA - admitted 01/2020 with CVA, - on DAPT for prior CVA, not for a cardiac reason.  - now just on plavix per neuro   5. Anemia - Hgb was at 7.4 when checked in 06/2020.  - admit 06/2020 with chest pain, mild trop 200s. Anemia during admit. Thought to be demandi ischemia, managed medically - last Hgb was 12.1       6. DM2 - followed by pcp   7. 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 s/p cath in 03/2014 showing 30% mid-LAD, moderate to severe disease along small D1, patent LCx, moderate to severe distal OM2 stenosis and moderate diffuse diease along RCA not amenable to PCI   CHF (congestive heart failure) ([place])    a. EF 55-60% in 12/2019 b. EF at 35-40% by echo in 05/2020   Diabetes mellitus without complication (HCC)    Myocardial infarction (HCC)    Neuropathy    Renal disorder    stage 3 kidney disease   Stroke (HCC)      Allergies  Allergen Reactions   Wellbutrin [Bupropion] Hives   Ciprofloxacin Hcl Hives and Rash    Hives/rash at  injection site    Tape Rash     Current Outpatient Medications  Medication Sig Dispense Refill   amoxicillin-clavulanate (AUGMENTIN) 875-125 MG tablet Take 1 tablet by mouth 2 (two) times daily for 7 days. 14 tablet 0   atorvastatin (LIPITOR) 40 MG tablet TAKE 1 TABLET BY MOUTH ONCE DAILY. 90 tablet 3   clopidogrel (PLAVIX) 75 MG tablet TAKE 1 TABLET BY MOUTH ONCE DAILY. 30 tablet 6   Continuous Blood Gluc Sensor (DEXCOM G6 SENSOR) MISC SMARTSIG:1 Each Topical Every 10 Days     Continuous Blood Gluc Transmit (DEXCOM G6 TRANSMITTER) MISC      doxycycline (VIBRAMYCIN) 100 MG capsule Take 1 capsule (100 mg total) by mouth 2 (two) times daily. 14 capsule 0   fenofibrate 160 MG tablet TAKE 1 TABLET BY MOUTH ONCE DAILY. 90 tablet 3   furosemide (LASIX) 40 MG tablet Take 40 mg by mouth daily as needed for fluid.      glucagon (GLUCAGON EMERGENCY) 1 MG injection Inject 1 mg into the vein once as needed. 1 each 12   hydrALAZINE (APRESOLINE) 25 MG tablet Take 1.5 tablets (37.5 mg total) by mouth 3 (three) times daily. (Patient not taking: Reported on 06/18/2021) 135 tablet 6   isosorbide mononitrate (IMDUR) 30 MG 24 hr tablet Take 0.5 tablets (15 mg total) by mouth daily. (Patient taking differently: Take 30 mg by mouth daily.) 45 tablet 3   levothyroxine (SYNTHROID) 175 MCG tablet Take 175 mcg by mouth daily before breakfast.   10   metoprolol succinate (TOPROL-XL) 25 MG 24 hr tablet TAKE 1/2 A TABLET BY MOUTH IN THE MORNING AND AT BEDTIME. (Patient taking differently: Take 25 mg by mouth daily.) 90 tablet 1   nitroGLYCERIN (NITROSTAT) 0.4 MG SL tablet Place 0.4 mg under the tongue every 5 (five) minutes x 3 doses as needed for chest pain.     NOVOLOG FLEXPEN 100 UNIT/ML FlexPen Inject 2-10 Units into the skin 3 (three) times daily with meals.     pantoprazole (PROTONIX) 40 MG tablet Take 40 mg by mouth daily as needed (for acid reflux).      potassium chloride SA (K-DUR,KLOR-CON) 20 MEQ tablet Take 20  mEq by mouth daily as needed (When taking Lasix (Furosemide)).      SEMGLEE, YFGN, 100 UNIT/ML Pen 35 Units 2 (two) times daily.     No current facility-administered medications for this visit.     Past Surgical History:  Procedure Laterality Date   Cardiac catherization     CHOLECYSTECTOMY     TUBAL LIGATION       Allergies  Allergen Reactions   Wellbutrin [Bupropion] Hives   Ciprofloxacin Hcl Hives and Rash    Hives/rash at injection site    Tape Rash      Family History  Problem Relation Age of Onset   Thyroid disease Mother    Hypertension Mother    Hyperlipidemia Mother    CAD Mother    CVA Mother    Hyperlipidemia Father    Hypertension Father    CAD Father    Breast cancer Paternal Grandmother 64   Cancer Paternal Grandmother        lung     Social History Ms. Guedes reports that she has been smoking cigarettes. She has been smoking an average of .25 packs per day. She has never used smokeless tobacco. Ms. Cooter reports no history of alcohol use.   Review of Systems CONSTITUTIONAL: No weight loss, fever, chills, weakness or fatigue.  HEENT: Eyes: No visual loss, blurred vision, double vision or yellow sclerae.No hearing loss, sneezing, congestion, runny nose or sore throat.  SKIN: No rash or itching.  CARDIOVASCULAR: per hpi RESPIRATORY: No shortness of breath, cough or sputum.  GASTROINTESTINAL: No anorexia, nausea, vomiting or diarrhea. No abdominal pain or blood.  GENITOURINARY: No burning on urination, no polyuria NEUROLOGICAL: No headache, dizziness, syncope, paralysis, ataxia, numbness or tingling in the extremities. No change in bowel or bladder control.  MUSCULOSKELETAL: No muscle, back pain, joint pain or stiffness.  LYMPHATICS: No enlarged nodes. No history of splenectomy.  PSYCHIATRIC: No history of depression or anxiety.  ENDOCRINOLOGIC: No reports of sweating, cold or heat intolerance. No polyuria or polydipsia.  Marland Kitchen   Physical  Examination Today's Vitals   06/25/21 0945  BP: 118/80  Pulse: (!) 104  SpO2: 98%  Weight: 215 lb 3.2 oz (97.6 kg)  Height: 5\' 5"  (1.651 m)   Body mass index is 35.81 kg/m.  Gen: resting comfortably, no acute distress HEENT: no scleral icterus, pupils equal round and reactive, no palptable cervical adenopathy,  CV: RRR, no m/rg/ no jvd Resp: Clear to auscultation bilaterally GI: abdomen is soft, non-tender, non-distended, normal bowel sounds, no hepatosplenomegaly MSK: extremities are warm, trace bilateral edema Skin: warm, no rash Neuro:  no focal deficits Psych: appropriate affect   Diagnostic Studies  12/2019 echo IMPRESSIONS     1. Left ventricular ejection fraction, by estimation, is 55 to 60%. The  left ventricle has normal function. The left ventricle demonstrates  regional wall motion abnormalities (see scoring diagram/findings for  description). There is mild left ventricular   hypertrophy. Left ventricular diastolic parameters were normal. There is  moderate hypokinesis of the left ventricular, apical. There is sluggish  apical flow without clear formed thrombus.   2. Right ventricular systolic function is normal. The right ventricular  size is normal. There is mildly elevated pulmonary artery systolic  pressure.   3. The mitral valve is normal in structure. Trivial mitral valve  regurgitation. No evidence of mitral stenosis.   4. The inferior vena cava is normal in size with greater than 50%  respiratory variability, suggesting right atrial pressure of 3 mmHg.   5. The aortic valve is tricuspid. Aortic valve regurgitation is not  visualized. No aortic stenosis is present.     02/2020 nuclear stress No diagnostic ST segment changes to indicate ischemia. Large, severe intensity, periapical defect extending into the anterolateral wall and inferior septum that is largely fixed and consistent with infarct scar. There is a small region of apical anterolateral  reversibility indicating  a mild ischemic territory. This is a high risk study based on defect size although region is most consistent with scar and a mild region of peri-infarct ischemia. Nuclear stress EF: 48%. Large area of periapical akinesis.     Jan 2022 echo IMPRESSIONS     1. The mid to distal anteroseptal wall is akinetic. The apex is akinetic  with some portions dyskinetic, there is no signs of apical thrombus with  use of echocontrast. The mid to distal inferoseptal wall is akinetic. Marland Kitchen  Left ventricular ejection fraction,   by estimation, is 35 to 40%. The left ventricle has moderately decreased  function. Left ventricular diastolic parameters are consistent with Grade  III diastolic dysfunction (restrictive). Elevated left atrial pressure.   2. Right ventricular systolic function is normal. The right ventricular  size is normal.   3. Left atrial size was mildly dilated.   4. The mitral valve is normal in structure. Mild mitral valve  regurgitation. No evidence of mitral stenosis.   5. The inferior vena cava is normal in size with greater than 50%  respiratory variability, suggesting right atrial pressure of 3 mmHg.   6. Limited echo to evaluate LV function and wall motion.    Assessment and Plan   1.. Chronic systolic HF - probable ICM given her echo and nuclear findings - with her renal dysfunction have not pursued cath -prior side effects on higerh beta blocker dosing. - avoid ACE/ARB/ARNI/aldactone given renal function - will start jardiance 10mg  daily.  - continue prn lasix, some increased use after recent admission with cellulitis where she got IVFs.    2. HTN - With her CVA, CKD, and CHF needs optimal bp control - bp at goal, continue curernt meds     3. CAD - distal/small vessel disease by 2015 cath in [facility] - with recent echo and nuclear findings would suggest she had an apical infarct some time after her cath in 2015 -  With her renal dysfunction we have  not repeated a cath.  - no symptoms, continue curren tmeds   4. Hyperlipidemia -continue current meds, labs followd by pcp       Arnoldo Lenis, M.D.

## 2021-06-26 ENCOUNTER — Other Ambulatory Visit (HOSPITAL_COMMUNITY): Payer: Self-pay

## 2021-06-26 MED ORDER — EMPAGLIFLOZIN 10 MG PO TABS
10.0000 mg | ORAL_TABLET | Freq: Every day | ORAL | 1 refills | Status: DC
Start: 2021-06-25 — End: 2021-10-19

## 2021-06-26 MED ORDER — PANTOPRAZOLE SODIUM 40 MG PO TBEC: 40.0000 mg | DELAYED_RELEASE_TABLET | Freq: Every day | ORAL | 2 refills | Status: DC | PRN

## 2021-06-26 MED ORDER — INSULIN GLARGINE-YFGN 100 UNIT/ML ~~LOC~~ SOPN
PEN_INJECTOR | SUBCUTANEOUS | 0 refills | Status: DC
  Filled 2021-07-03: qty 15, 18d supply, fill #0

## 2021-06-26 MED ORDER — KETOCONAZOLE 2 % EX CREA: TOPICAL_CREAM | CUTANEOUS | 0 refills | Status: DC

## 2021-06-26 MED ORDER — INSULIN PEN NEEDLE 31G X 5 MM MISC
10 refills | Status: DC
Start: 2020-08-25 — End: 2021-12-15

## 2021-06-26 MED ORDER — INSULIN ASPART 100 UNIT/ML FLEXPEN
PEN_INJECTOR | SUBCUTANEOUS | 1 refills | Status: DC
Start: 2020-08-25 — End: 2021-08-27
  Filled 2021-07-03 – 2021-07-06 (×2): qty 90, 90d supply, fill #0
  Filled 2021-07-06: qty 45, 90d supply, fill #0

## 2021-06-26 MED ORDER — TRIAMCINOLONE ACETONIDE 0.1 % EX CREA: TOPICAL_CREAM | CUTANEOUS | 1 refills | Status: DC

## 2021-06-26 MED ORDER — DEXCOM G6 SENSOR MISC: 8 refills | Status: DC

## 2021-06-26 MED ORDER — FREESTYLE LIBRE 2 SENSOR MISC
9 refills | Status: DC
  Filled 2021-07-03 – 2021-07-06 (×2): qty 2, 28d supply, fill #0
  Filled 2021-08-03: qty 2, 28d supply, fill #1

## 2021-06-30 ENCOUNTER — Other Ambulatory Visit: Payer: Self-pay

## 2021-06-30 ENCOUNTER — Encounter: Payer: Self-pay | Admitting: Podiatry

## 2021-06-30 ENCOUNTER — Ambulatory Visit: Payer: No Typology Code available for payment source | Admitting: Podiatry

## 2021-06-30 DIAGNOSIS — E10621 Type 1 diabetes mellitus with foot ulcer: Secondary | ICD-10-CM

## 2021-06-30 DIAGNOSIS — L97422 Non-pressure chronic ulcer of left heel and midfoot with fat layer exposed: Secondary | ICD-10-CM

## 2021-06-30 DIAGNOSIS — M21962 Unspecified acquired deformity of left lower leg: Secondary | ICD-10-CM

## 2021-06-30 MED ORDER — MUPIROCIN 2 % EX OINT: 1.0000 "application " | TOPICAL_OINTMENT | Freq: Two times a day (BID) | CUTANEOUS | 2 refills | Status: DC

## 2021-06-30 NOTE — Progress Notes (Signed)
°  Subjective:  Patient ID: Lauren [name], female    DOB: 1980-10-30,  MRN: [id number]  Chief Complaint  Patient presents with   cellulitis      NP L Cellulitis of left lower extremity    41 y.o. female presents with the above complaint. History confirmed with patient.  The ulcer developed as a callus a few weeks ago and began to blister.  Her PCP took drainage from this and sent for culture and showed no infection.  Used callus remover and this caused a blister to worsen.  Became infected and went to the ER x-rays and MRI showed no clear evidence of osteomyelitis they placed her on antibiotics which she just finished doxycycline.  Feels well no fevers chills nausea vomiting.  She is trying to get her diabetes under control her A1c is over 9% at this point.  Objective:  Physical Exam: warm, good capillary refill, normal DP and PT pulses, and diffuse neuropathy she has a full-thickness ulceration on the plantar fifth MTPJ with severe overlying hyperkeratosis measuring 2.5 x 1.2 x 0.3 cm.  No purulence malodor or exposed bone subcutaneous tissue.  No cellulitis.     Radiographs and MRI were reviewed from ER 06/18/2021 no evidence of osteomyelitis on either film Assessment:   1. 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 Acquired deformity of left foot      Plan:  Patient was evaluated and treated and all questions answered.  Ulcer left foot -We discussed the etiology and factors that are a part of the wound healing process.  We also discussed the risk of infection both soft tissue and osteomyelitis from open ulceration.  Discussed the risk of limb loss if this happens or worsens. -Debridement as below. -Dressed with Iodosorb, DSD. -Continue home dressing changes daily with gauze or a silicone border dressing and mupirocin -Continue off-loading with surgical shoe and peg assist device, these were dispensed today. -Vascular  testing not indicated she has palpable pulses -HgbA1c: 9.8% on 06/19/2021, I do not see an A1c below 9% in her chart currently -Last antibiotics: Recently finished doxycycline I do not see indication today clinically to restart these -Imaging: x-ray and MRI reviewed, shows no signs of erosions, osteolysis, osteomyelitis or emphysema.  Procedure: Excisional Debridement of Wound Rationale: Removal of non-viable soft tissue from the wound to promote healing.  Anesthesia: none Post-Debridement Wound Measurements: 2.5 cm x 1.2 cm x 0.3 cm  Type of Debridement: Sharp Excisional Tissue Removed: Non-viable soft tissue Depth of Debridement: subcutaneous tissue. Technique: Sharp excisional debridement to bleeding, viable wound base.  Dressing: Dry, sterile, compression dressing. Disposition: Patient tolerated procedure well.    Return in about 2 weeks (around 07/14/2021) for wound care.       Return in about 2 weeks (around 07/14/2021) for wound care.

## 2021-07-03 ENCOUNTER — Other Ambulatory Visit (HOSPITAL_BASED_OUTPATIENT_CLINIC_OR_DEPARTMENT_OTHER): Payer: Self-pay

## 2021-07-06 ENCOUNTER — Other Ambulatory Visit (HOSPITAL_BASED_OUTPATIENT_CLINIC_OR_DEPARTMENT_OTHER): Payer: Self-pay

## 2021-07-06 ENCOUNTER — Other Ambulatory Visit (HOSPITAL_COMMUNITY): Payer: Self-pay

## 2021-07-07 ENCOUNTER — Other Ambulatory Visit (HOSPITAL_BASED_OUTPATIENT_CLINIC_OR_DEPARTMENT_OTHER): Payer: Self-pay

## 2021-07-08 ENCOUNTER — Other Ambulatory Visit (HOSPITAL_BASED_OUTPATIENT_CLINIC_OR_DEPARTMENT_OTHER): Payer: Self-pay

## 2021-07-08 ENCOUNTER — Other Ambulatory Visit (HOSPITAL_COMMUNITY): Payer: Self-pay

## 2021-07-09 ENCOUNTER — Other Ambulatory Visit (HOSPITAL_BASED_OUTPATIENT_CLINIC_OR_DEPARTMENT_OTHER): Payer: Self-pay

## 2021-07-10 ENCOUNTER — Other Ambulatory Visit (HOSPITAL_BASED_OUTPATIENT_CLINIC_OR_DEPARTMENT_OTHER): Payer: Self-pay

## 2021-07-13 ENCOUNTER — Other Ambulatory Visit (HOSPITAL_BASED_OUTPATIENT_CLINIC_OR_DEPARTMENT_OTHER): Payer: Self-pay

## 2021-07-13 ENCOUNTER — Other Ambulatory Visit (HOSPITAL_COMMUNITY): Payer: Self-pay

## 2021-07-15 ENCOUNTER — Other Ambulatory Visit (HOSPITAL_COMMUNITY): Payer: Self-pay

## 2021-07-21 ENCOUNTER — Ambulatory Visit: Payer: No Typology Code available for payment source | Admitting: Podiatry

## 2021-07-21 ENCOUNTER — Other Ambulatory Visit: Payer: Self-pay

## 2021-07-21 DIAGNOSIS — L97422 Non-pressure chronic ulcer of left heel and midfoot with fat layer exposed: Secondary | ICD-10-CM | POA: Diagnosis not present

## 2021-07-21 DIAGNOSIS — E10621 Type 1 diabetes mellitus with foot ulcer: Secondary | ICD-10-CM | POA: Diagnosis not present

## 2021-07-21 DIAGNOSIS — M21962 Unspecified acquired deformity of left lower leg: Secondary | ICD-10-CM

## 2021-07-24 ENCOUNTER — Other Ambulatory Visit (HOSPITAL_COMMUNITY): Payer: Self-pay

## 2021-07-24 ENCOUNTER — Encounter: Payer: Self-pay | Admitting: Podiatry

## 2021-07-24 NOTE — Progress Notes (Signed)
?  Subjective:  ?Patient ID: Lauren [name], female    DOB: Aug 29, 1980,  MRN: [id number] ? ?Chief Complaint  ?Patient presents with  ? Diabetic Ulcer  ?  2 week follow up left foot ulcer  ? ? ?41 y.o. female presents with the above complaint. History confirmed with patient.  The ulcer developed as a callus a few weeks ago and began to blister.  Her PCP took drainage from this and sent for culture and showed no infection.  Used callus remover and this caused a blister to worsen.  Became infected and went to the ER x-rays and MRI showed no clear evidence of osteomyelitis they placed her on antibiotics which she just finished doxycycline.  Feels well no fevers chills nausea vomiting.  She is trying to get her diabetes under control her A1c is over 9% at this point. ? ? ?Interval history: ?She is doing well she is been applying the ointment ? ?Objective:  ?Physical Exam: ?warm, good capillary refill, normal DP and PT pulses, and diffuse neuropathy she has a full-thickness ulceration on the plantar fifth MTPJ with severe overlying hyperkeratosis measuring nearly fully healed and is now a fissure.  No purulence malodor or exposed bone subcutaneous tissue.  No cellulitis. ? ? ? ? ?Radiographs and MRI were reviewed from ER 06/18/2021 no evidence of osteomyelitis on either film ?Assessment:  ? ?1. 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 Acquired deformity of left foot   ? ? ? ?Plan:  ?Patient was evaluated and treated and all questions answered. ? ?Ulcer left foot ?-We discussed the etiology and factors that are a part of the wound healing process.  We also discussed the risk of infection both soft tissue and osteomyelitis from open ulceration.  Discussed the risk of limb loss if this happens or worsens. ?-Continue home dressing changes daily with gauze or a silicone border dressing and mupirocin ?-Continue off-loading with surgical shoe and peg assist device,  these were dispensed today. ?-Vascular testing not indicated she has palpable pulses ?-HgbA1c: 9.8% on 06/19/2021, I do not see an A1c below 9% in her chart currently ?-Last antibiotics: Recently finished doxycycline I do not see indication today clinically to restart these ?-Imaging: x-ray and MRI reviewed, shows no signs of erosions, osteolysis, osteomyelitis or emphysema. ? ?Doing well seems to be improving I will see her back in 3 weeks for further evaluation continue mupirocin ointment in the interim ?   ? ? ?Return in about 3 weeks (around 08/11/2021) for wound care.  ? ?

## 2021-07-25 ENCOUNTER — Other Ambulatory Visit (HOSPITAL_COMMUNITY): Payer: Self-pay

## 2021-08-03 ENCOUNTER — Other Ambulatory Visit (HOSPITAL_COMMUNITY): Payer: Self-pay

## 2021-08-05 ENCOUNTER — Other Ambulatory Visit (HOSPITAL_COMMUNITY): Payer: Self-pay

## 2021-08-05 MED ORDER — INSULIN GLARGINE-YFGN 100 UNIT/ML ~~LOC~~ SOPN
PEN_INJECTOR | SUBCUTANEOUS | 0 refills | Status: DC
  Filled 2021-08-05: qty 15, 18d supply, fill #0

## 2021-08-10 ENCOUNTER — Ambulatory Visit: Payer: No Typology Code available for payment source | Admitting: Podiatry

## 2021-08-24 ENCOUNTER — Ambulatory Visit (INDEPENDENT_AMBULATORY_CARE_PROVIDER_SITE_OTHER): Payer: No Typology Code available for payment source

## 2021-08-24 ENCOUNTER — Ambulatory Visit: Payer: No Typology Code available for payment source | Admitting: Podiatry

## 2021-08-24 ENCOUNTER — Other Ambulatory Visit: Payer: Self-pay | Admitting: Podiatry

## 2021-08-24 ENCOUNTER — Encounter: Payer: Self-pay | Admitting: Podiatry

## 2021-08-24 DIAGNOSIS — L089 Local infection of the skin and subcutaneous tissue, unspecified: Secondary | ICD-10-CM | POA: Diagnosis not present

## 2021-08-24 DIAGNOSIS — E10621 Type 1 diabetes mellitus with foot ulcer: Secondary | ICD-10-CM

## 2021-08-24 DIAGNOSIS — L97422 Non-pressure chronic ulcer of left heel and midfoot with fat layer exposed: Secondary | ICD-10-CM | POA: Diagnosis not present

## 2021-08-24 DIAGNOSIS — E11628 Type 2 diabetes mellitus with other skin complications: Secondary | ICD-10-CM | POA: Diagnosis not present

## 2021-08-24 DIAGNOSIS — L02612 Cutaneous abscess of left foot: Secondary | ICD-10-CM | POA: Diagnosis not present

## 2021-08-24 DIAGNOSIS — L03116 Cellulitis of left lower limb: Secondary | ICD-10-CM | POA: Diagnosis not present

## 2021-08-24 MED ORDER — CEPHALEXIN 500 MG PO CAPS: 500.0000 mg | ORAL_CAPSULE | Freq: Three times a day (TID) | ORAL | 0 refills | Status: DC

## 2021-08-25 ENCOUNTER — Telehealth: Payer: Self-pay | Admitting: *Deleted

## 2021-08-25 NOTE — Progress Notes (Signed)
?Subjective:  ?Patient ID: Lauren [name], female    DOB: 15-Jun-1980,  MRN: [id number] ? ?Chief Complaint  ?Patient presents with  ? Diabetic Ulcer  ?  3 week follow up left foot  ? ? ?41 y.o. female presents with the above complaint. History confirmed with patient.  The ulcer developed as a callus a few weeks ago and began to blister.  Her PCP took drainage from this and sent for culture and showed no infection.  Used callus remover and this caused a blister to worsen.  Became infected and went to the ER x-rays and MRI showed no clear evidence of osteomyelitis they placed her on antibiotics which she just finished doxycycline.  Feels well no fevers chills nausea vomiting.  She is trying to get her diabetes under control her A1c is over 9% at this point. ? ? ?Interval history: ?Since last visit she was doing well until last week.  The wound got worse when she was working a 12-hour shift on Thursday.  Started hurting and she ran a slight fever this is resolved.  Had a lot of drainage yesterday and her husband helped express this.  Described as cloudy yellow drainage ? ?Objective:  ?Physical Exam: ?warm, good capillary refill, normal DP and PT pulses, and diffuse neuropathy she has a full-thickness ulceration on the plantar fifth MTPJ, this now extends plantar medial and dorsal lateral with a large fluctuant bulla and abscess in the area, there is cellulitis extending from this to the midfoot, significant heat and tenderness to palpation ? ? ?New radiographs taken today 08/24/2021 show no gas-forming infection or new erosions of the metatarsal ?Assessment:  ? ?1. 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 Diabetic foot infection ([place])   ?3. Abscess of left foot   ?4. Cellulitis of left foot   ? ? ? ?Plan:  ?Patient was evaluated and treated and all questions answered. ? ?Ulcer left foot ?-We discussed the etiology and factors that are a part of the wound  healing process.  We also discussed the risk of infection both soft tissue and osteomyelitis from open ulceration.  Discussed the risk of limb loss if this happens or worsens. ?-Today she had worsening cellulitis and a new abscess formation surrounding her wound.  Radiographs were taken which showed no worsening infection.  I recommended I&D of the area.  Following a local field block with 2 cc each of lidocaine and Marcaine and utilizing a tissue nipper and scalpel I performed a local I&D.  The wound is expanded in size and depth but thankfully does not appear to penetrate below the subcutaneous tissue there is no exposed bone tendon or joint.  This was dressed with Iodosorb and a clean bandage.  She tolerated procedure well.  I discussed with her the signs and symptoms of worsening infection and systemic signs such as recurrent fever chills nausea vomiting and she will go to the emergency room if this occurs. ?-Continue home dressing changes daily with gauze or a silicone border dressing and mupirocin ?-Continue off-loading with surgical shoe, a new surgical shoe was dispensed today, her last 1 is defective because the straps have broken and needs replacement ?-Vascular testing not indicated she has palpable pulses ?-HgbA1c: 9.8% on 06/19/2021, I do not see an A1c below 9% in her chart currently ?-For her significant cellulitis I recommended Rx for Keflex 500 mg 3 times daily.  A culture of the drainage was  taken today following sterile prep and during the I&D.  We will change her antibiotics pending results of the wound culture ?-Imaging: New films were taken today they show no signs of erosions, osteolysis, osteomyelitis or emphysema. ? ? ?   ? ? ?Return in about 8 days (around 09/01/2021) for wound care.  ? ?

## 2021-08-25 NOTE — Telephone Encounter (Signed)
[facility] is calling to request an acct. # for requistion for wound.  ?Called and gave the acct # to continue to process the order. ?

## 2021-08-27 ENCOUNTER — Inpatient Hospital Stay (HOSPITAL_COMMUNITY)
Admission: EM | Admit: 2021-08-27 | Discharge: 2021-09-05 | DRG: 854 | Disposition: A | Discharge status: Home / Self Care | Payer: No Typology Code available for payment source | Attending: Family Medicine | Admitting: Family Medicine

## 2021-08-27 ENCOUNTER — Emergency Department (HOSPITAL_COMMUNITY): Payer: No Typology Code available for payment source

## 2021-08-27 ENCOUNTER — Other Ambulatory Visit: Payer: Self-pay

## 2021-08-27 ENCOUNTER — Encounter (HOSPITAL_COMMUNITY): Payer: Self-pay | Admitting: Emergency Medicine

## 2021-08-27 DIAGNOSIS — N1832 Chronic kidney disease, stage 3b: Secondary | ICD-10-CM | POA: Diagnosis present

## 2021-08-27 DIAGNOSIS — E782 Mixed hyperlipidemia: Secondary | ICD-10-CM | POA: Diagnosis present

## 2021-08-27 DIAGNOSIS — E1065 Type 1 diabetes mellitus with hyperglycemia: Secondary | ICD-10-CM | POA: Diagnosis not present

## 2021-08-27 DIAGNOSIS — M86172 Other acute osteomyelitis, left ankle and foot: Secondary | ICD-10-CM

## 2021-08-27 DIAGNOSIS — E876 Hypokalemia: Secondary | ICD-10-CM | POA: Diagnosis present

## 2021-08-27 DIAGNOSIS — L97521 Non-pressure chronic ulcer of other part of left foot limited to breakdown of skin: Secondary | ICD-10-CM

## 2021-08-27 DIAGNOSIS — Z7984 Long term (current) use of oral hypoglycemic drugs: Secondary | ICD-10-CM

## 2021-08-27 DIAGNOSIS — A401 Sepsis due to streptococcus, group B: Secondary | ICD-10-CM | POA: Diagnosis present

## 2021-08-27 DIAGNOSIS — Z8249 Family history of ischemic heart disease and other diseases of the circulatory system: Secondary | ICD-10-CM

## 2021-08-27 DIAGNOSIS — D75839 Thrombocytosis, unspecified: Secondary | ICD-10-CM | POA: Diagnosis not present

## 2021-08-27 DIAGNOSIS — Z794 Long term (current) use of insulin: Secondary | ICD-10-CM | POA: Diagnosis not present

## 2021-08-27 DIAGNOSIS — I130 Hypertensive heart and chronic kidney disease with heart failure and stage 1 through stage 4 chronic kidney disease, or unspecified chronic kidney disease: Secondary | ICD-10-CM | POA: Diagnosis present

## 2021-08-27 DIAGNOSIS — Z79890 Hormone replacement therapy (postmenopausal): Secondary | ICD-10-CM

## 2021-08-27 DIAGNOSIS — I2510 Atherosclerotic heart disease of native coronary artery without angina pectoris: Secondary | ICD-10-CM | POA: Diagnosis not present

## 2021-08-27 DIAGNOSIS — E1040 Type 1 diabetes mellitus with diabetic neuropathy, unspecified: Secondary | ICD-10-CM | POA: Diagnosis present

## 2021-08-27 DIAGNOSIS — L97529 Non-pressure chronic ulcer of other part of left foot with unspecified severity: Secondary | ICD-10-CM | POA: Diagnosis present

## 2021-08-27 DIAGNOSIS — D631 Anemia in chronic kidney disease: Secondary | ICD-10-CM | POA: Diagnosis present

## 2021-08-27 DIAGNOSIS — R21 Rash and other nonspecific skin eruption: Secondary | ICD-10-CM | POA: Diagnosis not present

## 2021-08-27 DIAGNOSIS — Z7902 Long term (current) use of antithrombotics/antiplatelets: Secondary | ICD-10-CM

## 2021-08-27 DIAGNOSIS — I5042 Chronic combined systolic (congestive) and diastolic (congestive) heart failure: Secondary | ICD-10-CM | POA: Diagnosis present

## 2021-08-27 DIAGNOSIS — L03116 Cellulitis of left lower limb: Secondary | ICD-10-CM | POA: Diagnosis present

## 2021-08-27 DIAGNOSIS — E669 Obesity, unspecified: Secondary | ICD-10-CM | POA: Diagnosis present

## 2021-08-27 DIAGNOSIS — I10 Essential (primary) hypertension: Secondary | ICD-10-CM | POA: Diagnosis present

## 2021-08-27 DIAGNOSIS — S91302A Unspecified open wound, left foot, initial encounter: Secondary | ICD-10-CM | POA: Diagnosis not present

## 2021-08-27 DIAGNOSIS — Z803 Family history of malignant neoplasm of breast: Secondary | ICD-10-CM

## 2021-08-27 DIAGNOSIS — Z8349 Family history of other endocrine, nutritional and metabolic diseases: Secondary | ICD-10-CM

## 2021-08-27 DIAGNOSIS — A419 Sepsis, unspecified organism: Secondary | ICD-10-CM | POA: Diagnosis present

## 2021-08-27 DIAGNOSIS — Z79899 Other long term (current) drug therapy: Secondary | ICD-10-CM

## 2021-08-27 DIAGNOSIS — L03119 Cellulitis of unspecified part of limb: Secondary | ICD-10-CM | POA: Diagnosis not present

## 2021-08-27 DIAGNOSIS — E1052 Type 1 diabetes mellitus with diabetic peripheral angiopathy with gangrene: Secondary | ICD-10-CM | POA: Diagnosis not present

## 2021-08-27 DIAGNOSIS — Z87891 Personal history of nicotine dependence: Secondary | ICD-10-CM | POA: Diagnosis not present

## 2021-08-27 DIAGNOSIS — E11628 Type 2 diabetes mellitus with other skin complications: Secondary | ICD-10-CM | POA: Diagnosis not present

## 2021-08-27 DIAGNOSIS — E10621 Type 1 diabetes mellitus with foot ulcer: Secondary | ICD-10-CM | POA: Diagnosis present

## 2021-08-27 DIAGNOSIS — Z823 Family history of stroke: Secondary | ICD-10-CM

## 2021-08-27 DIAGNOSIS — E039 Hypothyroidism, unspecified: Secondary | ICD-10-CM | POA: Diagnosis present

## 2021-08-27 DIAGNOSIS — L97509 Non-pressure chronic ulcer of other part of unspecified foot with unspecified severity: Secondary | ICD-10-CM | POA: Diagnosis present

## 2021-08-27 DIAGNOSIS — L98499 Non-pressure chronic ulcer of skin of other sites with unspecified severity: Secondary | ICD-10-CM | POA: Diagnosis not present

## 2021-08-27 DIAGNOSIS — M869 Osteomyelitis, unspecified: Secondary | ICD-10-CM | POA: Diagnosis not present

## 2021-08-27 DIAGNOSIS — E1169 Type 2 diabetes mellitus with other specified complication: Secondary | ICD-10-CM | POA: Diagnosis not present

## 2021-08-27 DIAGNOSIS — I96 Gangrene, not elsewhere classified: Secondary | ICD-10-CM | POA: Diagnosis present

## 2021-08-27 DIAGNOSIS — I252 Old myocardial infarction: Secondary | ICD-10-CM

## 2021-08-27 DIAGNOSIS — E10649 Type 1 diabetes mellitus with hypoglycemia without coma: Secondary | ICD-10-CM | POA: Diagnosis not present

## 2021-08-27 DIAGNOSIS — Z8673 Personal history of transient ischemic attack (TIA), and cerebral infarction without residual deficits: Secondary | ICD-10-CM

## 2021-08-27 DIAGNOSIS — E66812 Obesity, class 2: Secondary | ICD-10-CM | POA: Diagnosis present

## 2021-08-27 DIAGNOSIS — E1059 Type 1 diabetes mellitus with other circulatory complications: Secondary | ICD-10-CM | POA: Diagnosis present

## 2021-08-27 DIAGNOSIS — I5022 Chronic systolic (congestive) heart failure: Secondary | ICD-10-CM | POA: Diagnosis not present

## 2021-08-27 DIAGNOSIS — E1022 Type 1 diabetes mellitus with diabetic chronic kidney disease: Secondary | ICD-10-CM | POA: Diagnosis present

## 2021-08-27 DIAGNOSIS — E1069 Type 1 diabetes mellitus with other specified complication: Secondary | ICD-10-CM | POA: Diagnosis present

## 2021-08-27 DIAGNOSIS — L97523 Non-pressure chronic ulcer of other part of left foot with necrosis of muscle: Secondary | ICD-10-CM | POA: Diagnosis not present

## 2021-08-27 DIAGNOSIS — Z6835 Body mass index (BMI) 35.0-35.9, adult: Secondary | ICD-10-CM | POA: Diagnosis not present

## 2021-08-27 DIAGNOSIS — I159 Secondary hypertension, unspecified: Secondary | ICD-10-CM | POA: Diagnosis not present

## 2021-08-27 DIAGNOSIS — Z881 Allergy status to other antibiotic agents status: Secondary | ICD-10-CM

## 2021-08-27 DIAGNOSIS — T361X5A Adverse effect of cephalosporins and other beta-lactam antibiotics, initial encounter: Secondary | ICD-10-CM | POA: Diagnosis not present

## 2021-08-27 DIAGNOSIS — Z888 Allergy status to other drugs, medicaments and biological substances status: Secondary | ICD-10-CM

## 2021-08-27 DIAGNOSIS — L089 Local infection of the skin and subcutaneous tissue, unspecified: Secondary | ICD-10-CM | POA: Diagnosis not present

## 2021-08-27 DIAGNOSIS — N1831 Chronic kidney disease, stage 3a: Secondary | ICD-10-CM | POA: Diagnosis not present

## 2021-08-27 LAB — BASIC METABOLIC PANEL
Anion gap: 10 (ref 5–15)
BUN: 29 mg/dL — ABNORMAL HIGH (ref 6–20)
CO2: 23 mmol/L (ref 22–32)
Calcium: 7.8 mg/dL — ABNORMAL LOW (ref 8.9–10.3)
Chloride: 101 mmol/L (ref 98–111)
Creatinine, Ser: 2.33 mg/dL — ABNORMAL HIGH (ref 0.44–1.00)
GFR, Estimated: 27 mL/min — ABNORMAL LOW (ref >60)
Glucose, Bld: 354 mg/dL — ABNORMAL HIGH (ref 70–99)
Potassium: 3.3 mmol/L — ABNORMAL LOW (ref 3.5–5.1)
Sodium: 134 mmol/L — ABNORMAL LOW (ref 135–145)

## 2021-08-27 LAB — WOUND CULTURE
MICRO NUMBER:: 13303988
SPECIMEN QUALITY:: ADEQUATE

## 2021-08-27 LAB — CBC
HCT: 38.9 % (ref 36.0–46.0)
Hemoglobin: 13.3 g/dL (ref 12.0–15.0)
MCH: 30.8 pg (ref 26.0–34.0)
MCHC: 34.2 g/dL (ref 30.0–36.0)
MCV: 90 fL (ref 80.0–100.0)
Platelets: 369 10*3/uL (ref 150–400)
RBC: 4.32 MIL/uL (ref 3.87–5.11)
RDW: 12.4 % (ref 11.5–15.5)
WBC: 16.8 10*3/uL — ABNORMAL HIGH (ref 4.0–10.5)
nRBC: 0 % (ref 0.0–0.2)

## 2021-08-27 LAB — CBG MONITORING, ED: Glucose-Capillary: 327 mg/dL — ABNORMAL HIGH (ref 70–99)

## 2021-08-27 LAB — HOUSE ACCOUNT TRACKING

## 2021-08-27 LAB — SEDIMENTATION RATE: Sed Rate: 80 mm/hr — ABNORMAL HIGH (ref 0–22)

## 2021-08-27 LAB — C-REACTIVE PROTEIN: CRP: 19.3 mg/dL — ABNORMAL HIGH (ref <1.0)

## 2021-08-27 LAB — LACTIC ACID, PLASMA: Lactic Acid, Venous: 1.1 mmol/L (ref 0.5–1.9)

## 2021-08-27 IMAGING — DX DG FOOT COMPLETE 3+V*L*
3 series · 3 of 3 positions shown · non-contrast
Comparison: [DATE]

CLINICAL DATA: Ulcer

EXAM:
LEFT FOOT - COMPLETE 3+ VIEW

[foot ap]
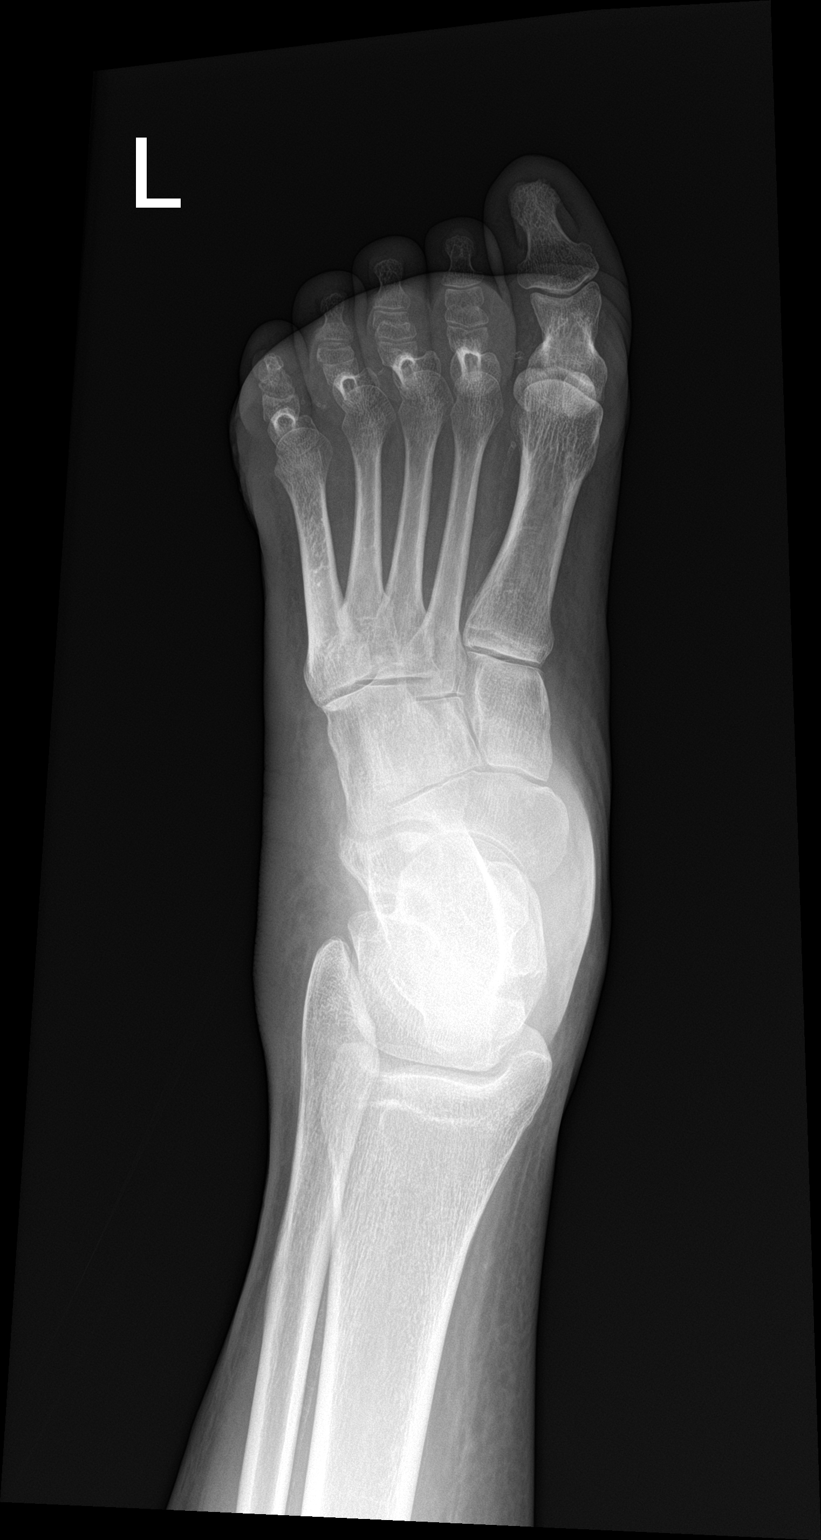

[foot obl]
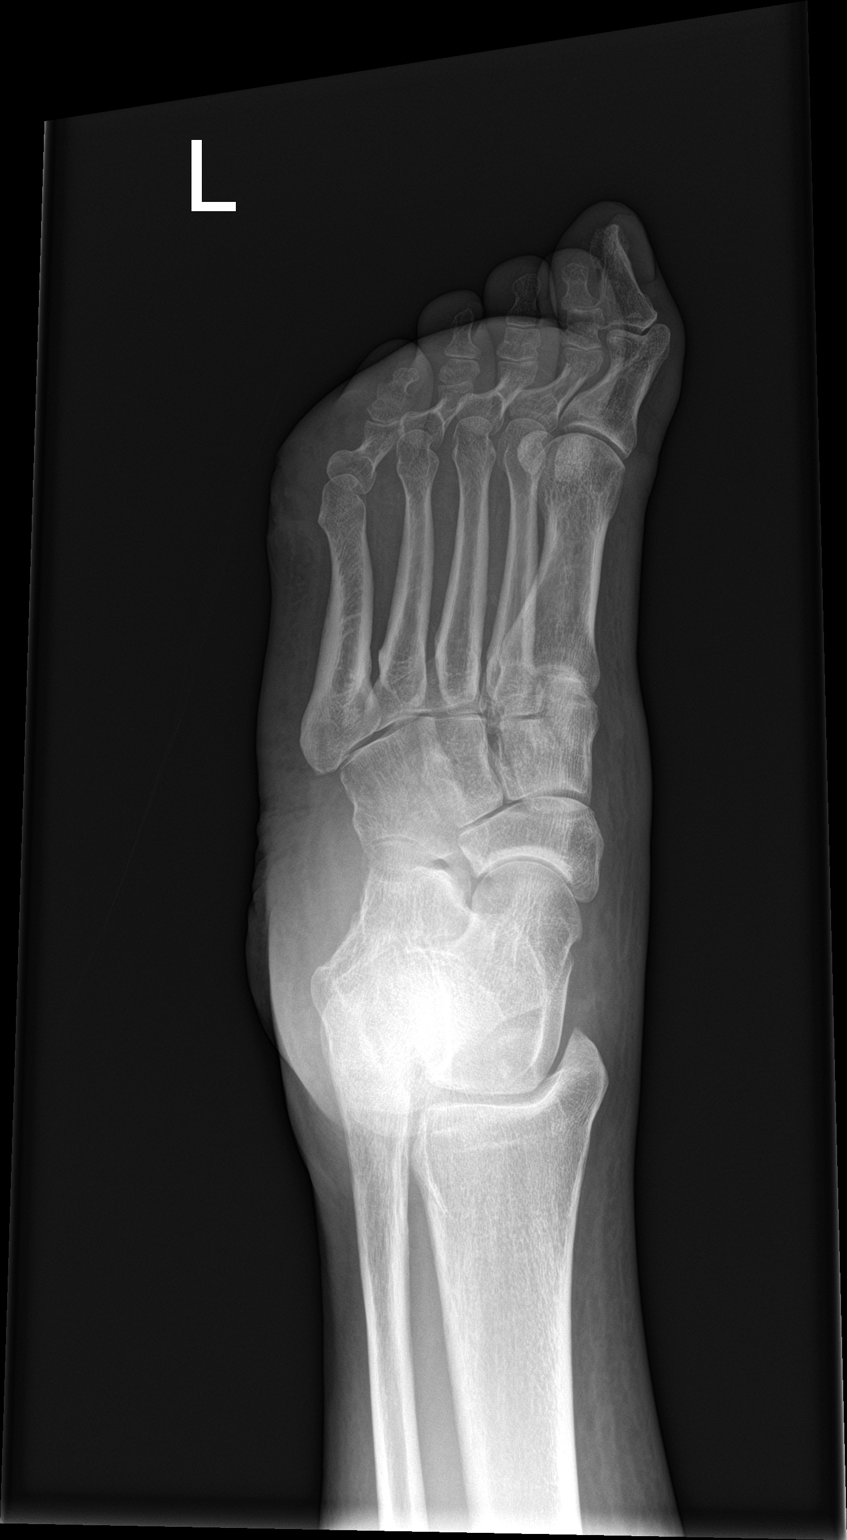

[foot lat]
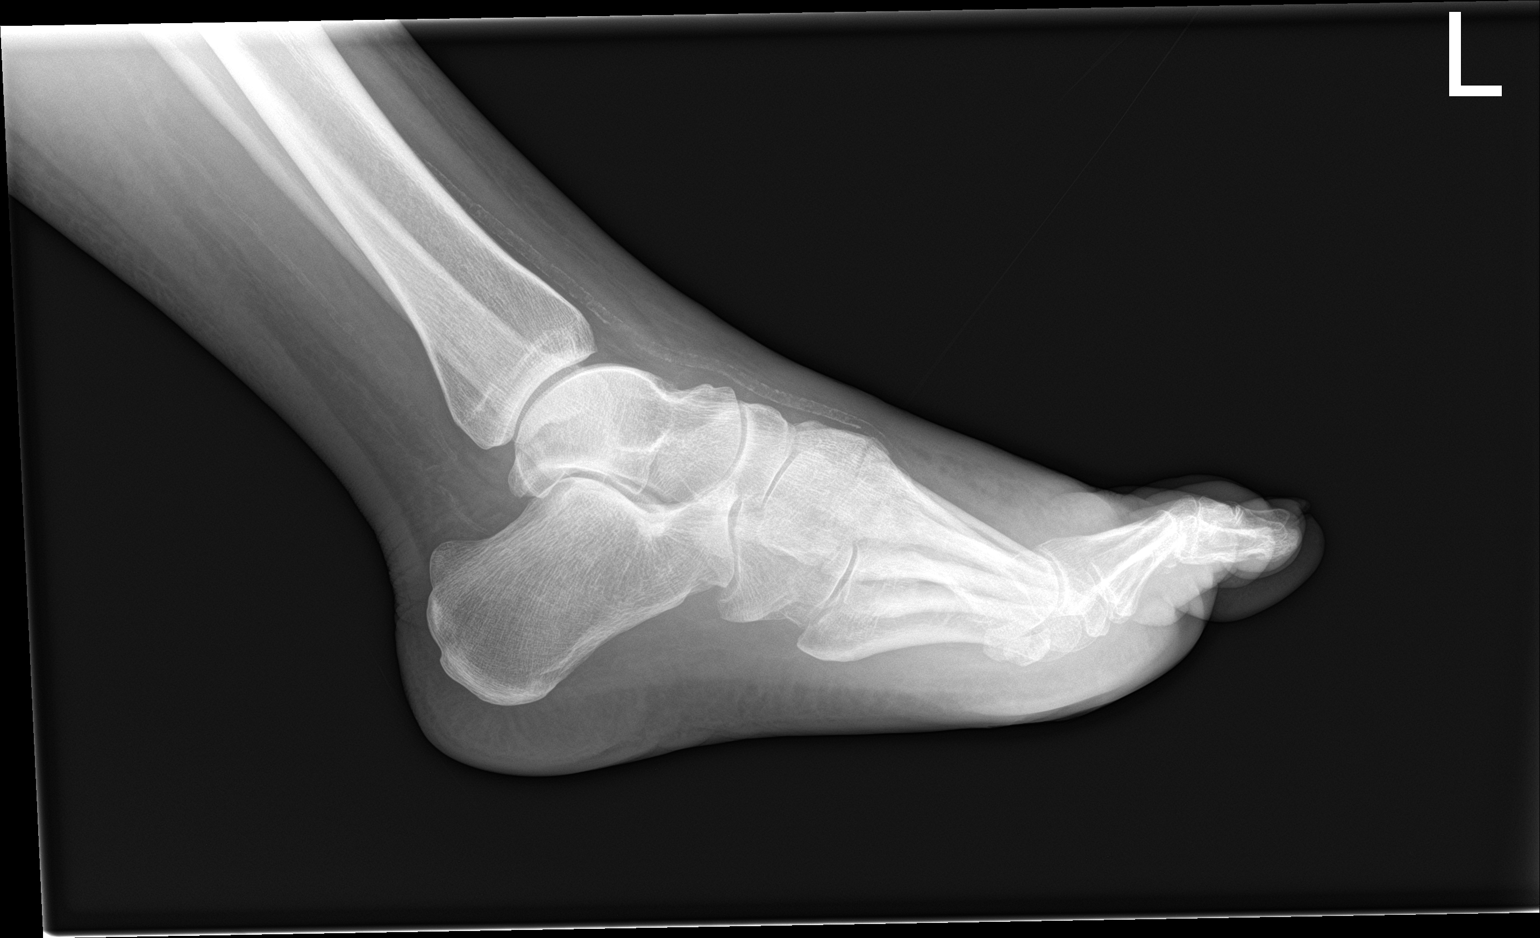

[3 of 3 positions shown; findings below may reference images not displayed]

FINDINGS: No fracture or malalignment. Vascular calcifications. Ulcer adjacent
to the fifth MTP joint. No periostitis or osseous destructive
change. There is diffuse soft tissue edema.
IMPRESSION: Ulcer lateral to the fifth MTP joint. No definite acute osseous
abnormality.

## 2021-08-27 MED ORDER — INSULIN ASPART 100 UNIT/ML IJ SOLN
0.0000 [IU] | INTRAMUSCULAR | Status: DC
  Administered 2021-08-27: 7 [IU] via SUBCUTANEOUS
  Administered 2021-08-28: 2 [IU] via SUBCUTANEOUS

## 2021-08-27 MED ORDER — SODIUM CHLORIDE 0.9 % IV SOLN
2.0000 g | Freq: Two times a day (BID) | INTRAVENOUS | Status: DC
  Administered 2021-08-27 – 2021-08-31 (×8): 2 g via INTRAVENOUS
  Filled 2021-08-27 (×8): qty 12.5

## 2021-08-27 MED ORDER — ACETAMINOPHEN 650 MG RE SUPP: 650.0000 mg | Freq: Four times a day (QID) | RECTAL | Status: DC | PRN

## 2021-08-27 MED ORDER — ACETAMINOPHEN 325 MG PO TABS
650.0000 mg | ORAL_TABLET | Freq: Four times a day (QID) | ORAL | Status: DC | PRN
  Administered 2021-08-27 – 2021-08-28 (×2): 650 mg via ORAL
  Filled 2021-08-27 (×3): qty 2

## 2021-08-27 MED ORDER — VANCOMYCIN HCL IN DEXTROSE 1-5 GM/200ML-% IV SOLN: 1000.0000 mg | Freq: Once | INTRAVENOUS | Status: DC

## 2021-08-27 MED ORDER — POTASSIUM CHLORIDE CRYS ER 20 MEQ PO TBCR
40.0000 meq | EXTENDED_RELEASE_TABLET | Freq: Once | ORAL | Status: AC
  Administered 2021-08-27: 40 meq via ORAL
  Filled 2021-08-27: qty 2

## 2021-08-27 MED ORDER — METRONIDAZOLE 500 MG/100ML IV SOLN
500.0000 mg | Freq: Two times a day (BID) | INTRAVENOUS | Status: DC
  Administered 2021-08-28 – 2021-08-30 (×7): 500 mg via INTRAVENOUS
  Filled 2021-08-27 (×6): qty 100

## 2021-08-27 MED ORDER — VANCOMYCIN HCL 1500 MG/300ML IV SOLN
1500.0000 mg | INTRAVENOUS | Status: DC
  Administered 2021-08-29 – 2021-08-31 (×2): 1500 mg via INTRAVENOUS
  Filled 2021-08-27 (×2): qty 300

## 2021-08-27 MED ORDER — PIPERACILLIN-TAZOBACTAM 3.375 G IVPB 30 MIN
3.3750 g | Freq: Once | INTRAVENOUS | Status: DC
  Administered 2021-08-27: 3.375 g via INTRAVENOUS
  Filled 2021-08-27: qty 50

## 2021-08-27 MED ORDER — SODIUM CHLORIDE 0.9 % IV SOLN
75.0000 mL/h | INTRAVENOUS | Status: DC
  Administered 2021-08-27: 75 mL/h via INTRAVENOUS

## 2021-08-27 MED ORDER — HYDROCODONE-ACETAMINOPHEN 5-325 MG PO TABS
1.0000 | ORAL_TABLET | ORAL | Status: DC | PRN
  Administered 2021-08-28 – 2021-08-30 (×5): 1 via ORAL
  Administered 2021-08-30: 2 via ORAL
  Administered 2021-08-31: 1 via ORAL
  Administered 2021-08-31: 2 via ORAL
  Administered 2021-08-31 (×2): 1 via ORAL
  Administered 2021-09-01 (×4): 2 via ORAL
  Administered 2021-09-02: 1 via ORAL
  Administered 2021-09-02 – 2021-09-04 (×8): 2 via ORAL
  Administered 2021-09-04 – 2021-09-05 (×2): 1 via ORAL
  Filled 2021-08-27: qty 2
  Filled 2021-08-27: qty 1
  Filled 2021-08-27 (×5): qty 2
  Filled 2021-08-27: qty 1
  Filled 2021-08-27 (×3): qty 2
  Filled 2021-08-27 (×2): qty 1
  Filled 2021-08-27 (×4): qty 2
  Filled 2021-08-27 (×3): qty 1
  Filled 2021-08-27: qty 2
  Filled 2021-08-27: qty 1
  Filled 2021-08-27 (×2): qty 2
  Filled 2021-08-27: qty 1
  Filled 2021-08-27: qty 2

## 2021-08-27 MED ORDER — VANCOMYCIN HCL 2000 MG/400ML IV SOLN
2000.0000 mg | Freq: Once | INTRAVENOUS | Status: AC
  Administered 2021-08-28: 2000 mg via INTRAVENOUS
  Filled 2021-08-27: qty 400

## 2021-08-27 NOTE — Assessment & Plan Note (Signed)
-  SIRS criteria met with  elevated white blood cell count,    ?   ?Component Value Date/Time  ? WBC 16.8 (H) 08/27/2021 2018  ? LYMPHSABS 1.5 06/18/2021 0750  ? ? ?tachycardia    RR >20 ?Today's Vitals  ? 08/27/21 1948 08/27/21 1957 08/27/21 2259  ?BP: (!) 141/89    ?Pulse: (!) 102    ?Resp: 16    ?Temp: 99.6 ?F (37.6 ?C)  99.6 ?F (37.6 ?C)  ?TempSrc: Oral  Oral  ?SpO2: 98%    ?Weight:  97.5 kg   ?Height:  $RemoveB'5\' 5"'[id number]$  (1.651 m)   ?PainSc:  7    ? ?Body mass index is 35.78 kg/m?. ? ?This patient meets SIRS Criteria and may be septic. ?The recent clinical data is shown below. ?Vitals:  ? 08/27/21 1948 08/27/21 1957 08/27/21 2259  ?BP: (!) 141/89    ?Pulse: (!) 102    ?Resp: 16    ?Temp: 99.6 ?F (37.6 ?C)  99.6 ?F (37.6 ?C)  ?TempSrc: Oral  Oral  ?SpO2: 98%    ?Weight:  97.5 kg   ?Height:  $RemoveB'5\' 5"'[id number]$  (1.651 m)   ? available to me, I don't think the patient is in septic shock.   ? ?-Most likely source being: soft tissue infection ? ? - Obtain serial lactic acid and procalcitonin level. ? - Initiated IV antibiotics in ER: ?Antibiotics Given (last 72 hours)   ? Date/Time Action Medication Dose Rate  ? 08/27/21 2252 New Bag/Given  ? piperacillin-tazobactam (ZOSYN) IVPB 3.375 g 3.375 g 100 mL/hr  ?  ? ? ?Will continue  on : Cefepime Vanco and Flagyl ? ? - await results of blood and urine culture ? - Rehydrate aggressively  ?Intravenous fluids were administered,  ?  ?  ?11:07 PM ? ?

## 2021-08-27 NOTE — Assessment & Plan Note (Signed)
Allow permissive hypertension 

## 2021-08-27 NOTE — Assessment & Plan Note (Addendum)
will need podiatry consult for now hold Plavix in case she needs any further operative intervention continue  statin ?

## 2021-08-27 NOTE — ED Provider Notes (Signed)
?[place] ?Provider Note ? ? ?CSN: [id number] ?Arrival date & time: 08/27/21  1943 ? ?  ? ?History ? ?Chief Complaint  ?Patient presents with  ? Foot Pain  ? ? ?Lauren [name] is a 41 y.o. female. ? ?HPI ?Patient presents with concern for foot pain, increasing redness and drainage.  Patient is on Keflex, started 4 days ago.  She has a known ulcer on the left lateral distal foot that has waxing, waning been improving, though with episodes requiring antibiotics.  Now over the past days, in spite of Keflex, she has had increasing pain, swelling, erythema throughout that area.  She developed nausea, chills, vomiting today. ?  ? ?Home Medications ?Prior to Admission medications   ?Medication Sig Start Date End Date Taking? Authorizing Provider  ?atorvastatin (LIPITOR) 40 MG tablet TAKE 1 TABLET BY MOUTH ONCE DAILY. 09/24/20   Arnoldo Lenis, MD  ?cephALEXin (KEFLEX) 500 MG capsule Take 1 capsule (500 mg total) by mouth 3 (three) times daily. 08/24/21   Criselda Peaches, DPM  ?clopidogrel (PLAVIX) 75 MG tablet TAKE 1 TABLET BY MOUTH ONCE DAILY. 03/23/21   Arnoldo Lenis, MD  ?Continuous Blood Gluc Sensor (DEXCOM G6 SENSOR) MISC SMARTSIG:1 Each Topical Every 10 Days 06/30/20   [provider]  ?Continuous Blood Gluc Sensor (DEXCOM G6 SENSOR) MISC Use to check blooid sugars as directed 06/27/20     ?Continuous Blood Gluc Sensor (FREESTYLE LIBRE 2 SENSOR) MISC Use to check blood sugar 04/03/21     ?Continuous Blood Gluc Transmit (DEXCOM G6 TRANSMITTER) MISC  06/30/20   [provider]  ?doxycycline (VIBRAMYCIN) 100 MG capsule Take 1 capsule (100 mg total) by mouth 2 (two) times daily. 06/19/21   Kathie Dike, MD  ?empagliflozin (JARDIANCE) 10 MG TABS tablet Take 1 tablet (10 mg total) by mouth daily before breakfast. 06/25/21   Arnoldo Lenis, MD  ?empagliflozin (JARDIANCE) 10 MG TABS tablet Take 1 tablet (10 mg total) by mouth daily before breakfast. 06/25/21    Arnoldo Lenis, MD  ?fenofibrate 160 MG tablet TAKE 1 TABLET BY MOUTH ONCE DAILY. 10/01/20   Arnoldo Lenis, MD  ?furosemide (LASIX) 40 MG tablet Take 1 tablet (40 mg total) by mouth daily as needed 06/25/21   Arnoldo Lenis, MD  ?glucagon (GLUCAGON EMERGENCY) 1 MG injection Inject 1 mg into the vein once as needed. 04/10/15   Cassandria Anger, MD  ?hydrALAZINE (APRESOLINE) 25 MG tablet Take 1 & 1/2 tablets (37.5 mg total) by mouth 3 (three) times daily. 02/19/21   Arnoldo Lenis, MD  ?insulin aspart (NOVOLOG) 100 UNIT/ML FlexPen Use sliding scale: BS 150-200: 2 units,201-250: 4 units,251-300: 6 units (max 50 units daily) 08/25/20     ?insulin glargine-yfgn (SEMGLEE) 100 UNIT/ML Pen Inject 80 units into the skin daily 06/19/21     ?insulin glargine-yfgn (SEMGLEE, YFGN,) 100 UNIT/ML Pen INJECT UP TO 80 UNITS UNDER THE SKIN ONCE DAILY.   CALL THE OFFICE TO SCHEDULE AN APPOINTMENT MISSED ON 06/17/2021 FOR A 5 MONTH VISIT. 08/05/21     ?Insulin Pen Needle 31G X 5 MM MISC Use to inject novolog and semglee as directed 08/25/20     ?isosorbide mononitrate (IMDUR) 30 MG 24 hr tablet Take 1 tablet (30 mg total) by mouth daily. 06/25/21 09/23/21  Arnoldo Lenis, MD  ?ketoconazole (NIZORAL) 2 % cream Apply a small amount to the affected area daily 10/29/20     ?levothyroxine (SYNTHROID) 175 MCG tablet  Take 175 mcg by mouth daily before breakfast.  08/12/16   [provider]  ?metoprolol succinate (TOPROL-XL) 25 MG 24 hr tablet Take 25 mg by mouth daily.    [provider]  ?mupirocin ointment (BACTROBAN) 2 % Apply 1 application topically 2 (two) times daily. 06/30/21   McDonald, Stephan Minister, DPM  ?nitroGLYCERIN (NITROSTAT) 0.4 MG SL tablet Place 0.4 mg under the tongue every 5 (five) minutes x 3 doses as needed for chest pain.    [provider]  ?NOVOLOG FLEXPEN 100 UNIT/ML FlexPen Inject 2-10 Units into the skin 3 (three) times daily with meals. 09/24/20   [provider]   ?pantoprazole (PROTONIX) 40 MG tablet Take 40 mg by mouth daily as needed (for acid reflux).     [provider]  ?pantoprazole (PROTONIX) 40 MG tablet Take 1 tablet (40 mg total) by mouth daily as needed for acid reflux 11/26/20     ?potassium chloride SA (KLOR-CON M) 20 MEQ tablet Take 1 tablet (20 mEq total) by mouth daily 06/25/21   Arnoldo Lenis, MD  ?[facility], YFGN, 100 UNIT/ML Pen 35 Units 2 (two) times daily. 01/26/21   [provider]  ?triamcinolone cream (KENALOG) 0.1 % Apply a small amount to the affected area 3 times a day 11/26/20     ?   ? ?Allergies    ?Wellbutrin [bupropion], Ciprofloxacin hcl, and Tape   ? ?Review of Systems   ?Review of Systems  ?Constitutional:   ?     Per HPI, otherwise negative  ?HENT:    ?     Per HPI, otherwise negative  ?Respiratory:    ?     Per HPI, otherwise negative  ?Cardiovascular:   ?     Per HPI, otherwise negative  ?Gastrointestinal:  Positive for nausea and vomiting.  ?Endocrine:  ?     Negative aside from HPI  ?Genitourinary:   ?     Neg aside from HPI   ?Musculoskeletal:   ?     Per HPI, otherwise negative  ?Skin:  Positive for wound.  ?Neurological:  Negative for syncope.  ? ?Physical Exam ?Updated Vital Signs ?BP (!) 141/89 (BP Location: Left Arm)   Pulse (!) 102   Temp 99.6 ?F (37.6 ?C) (Oral)   Resp 16   Ht 5\' 5"  (1.651 m)   Wt 97.5 kg   SpO2 98%   BMI 35.78 kg/m?  ?Physical Exam ?Vitals and nursing note reviewed.  ?Constitutional:   ?   General: She is not in acute distress. ?   Appearance: She is well-developed.  ?HENT:  ?   Head: Normocephalic and atraumatic.  ?Eyes:  ?   Conjunctiva/sclera: Conjunctivae normal.  ?Cardiovascular:  ?   Rate and Rhythm: Regular rhythm. Tachycardia present.  ?Pulmonary:  ?   Effort: Pulmonary effort is normal. No respiratory distress.  ?   Breath sounds: Normal breath sounds. No stridor.  ?Abdominal:  ?   General: There is no distension.  ?Skin: ?   General: Skin is warm and dry.  ? ?     ?Neurological:  ?   Mental Status: She is alert and oriented to person, place, and time.  ?   Cranial Nerves: No cranial nerve deficit.  ?Psychiatric:     ?   Mood and Affect: Mood normal.  ? ? ? ? ? ?ED Results / Procedures / Treatments   ?Labs ?(all labs ordered are listed, but only abnormal results are displayed) ?Labs  Reviewed  ?CBC - Abnormal; Notable for the following components:  ?    Result Value  ? WBC 16.8 (*)   ? All other components within normal limits  ?BASIC METABOLIC PANEL - Abnormal; Notable for the following components:  ? Sodium 134 (*)   ? Potassium 3.3 (*)   ? Glucose, Bld 354 (*)   ? BUN 29 (*)   ? Creatinine, Ser 2.33 (*)   ? Calcium 7.8 (*)   ? GFR, Estimated 27 (*)   ? All other components within normal limits  ?C-REACTIVE PROTEIN - Abnormal; Notable for the following components:  ? CRP 19.3 (*)   ? All other components within normal limits  ?LACTIC ACID, PLASMA  ?SEDIMENTATION RATE  ? ? ?EKG ?None ? ?Radiology ?DG Foot Complete Left ? ?Result Date: 08/27/2021 ?CLINICAL DATA:  Ulcer EXAM: LEFT FOOT - COMPLETE 3+ VIEW COMPARISON:  06/18/2021 FINDINGS: No fracture or malalignment. Vascular calcifications. Ulcer adjacent to the fifth MTP joint. No periostitis or osseous destructive change. There is diffuse soft tissue edema. IMPRESSION: Ulcer lateral to the fifth MTP joint. No definite acute osseous abnormality. Electronically Signed   By: Donavan Foil M.D.   On: 08/27/2021 20:41   ? ?Procedures ?Procedures  ? ? ?Medications Ordered in ED ?Medications  ?vancomycin (VANCOCIN) IVPB 1000 mg/200 mL premix (has no administration in time range)  ?piperacillin-tazobactam (ZOSYN) IVPB 3.375 g (has no administration in time range)  ? ? ?ED Course/ Medical Decision Making/ A&P ?This patient with a Hx of Diabetes, hypertension, chronic wound presents to the ED for concern of worsening characteristics of the wound, new chills, nausea, vomiting diarrhea, this involves an extensive number of treatment  options, and is a complaint that carries with it a high risk of complications and morbidity.   ? ?The differential diagnosis includes sepsis, bacteremia, osteomyelitis ? ? ?Social Determinants of Health: ? ?Obesity, hypertension ? ?Addi

## 2021-08-27 NOTE — Assessment & Plan Note (Signed)
Chronic stable follow-up as an outpatient 

## 2021-08-27 NOTE — ED Provider Triage Note (Signed)
Emergency Medicine Provider Triage Evaluation Note ? ?Lauren [name] , a 41 y.o. female  was evaluated in triage.  Pt complains of diabetic ulcer of the left foot with concern for worsening infection.  Patient has been on Keflex status post I&D with her podiatrist on Monday and has not missed any doses but reports that she has had fever, chills, nausea, vomiting starting an hour ago.  Worsening pain, redness of the foot.  Patient denies other infectious symptoms including sore throat, sneezing, cough. ? ?Review of Systems  ?Positive: Redness, swelling, fever, chills, nausea ?Negative: Cough, sore throat ? ?Physical Exam  ?BP (!) 141/89 (BP Location: Left Arm)   Pulse (!) 102   Temp 99.6 ?F (37.6 ?C) (Oral)   Resp 16   Ht 5\' 5"  (1.651 m)   Wt 97.5 kg   SpO2 98%   BMI 35.78 kg/m?  ?Gen:   Awake, ill appearing ?Resp:  Normal effort  ?MSK:   Moves extremities without difficulty ?Other:  Ulcer, redness noted on dorsal and lateral left foot. Significant TTP to palpation ? ?Medical Decision Making  ?Medically screening exam initiated at 8:14 PM.  Appropriate orders placed.  Lauren [name] was informed that the remainder of the evaluation will be completed by another provider, this initial triage assessment does not replace that evaluation, and the importance of remaining in the ED until their evaluation is complete. ? ?Workup initiated ?  ?Lauren Pickler, PA-C ?08/27/21 2017 ? ?

## 2021-08-27 NOTE — ED Triage Notes (Signed)
Pt reports a diabetic ulcer to her left foot x 3 months, pt concerned for infection to foot d/t chills and redness x 1 week, emesis x 2 episodes starting 1 hour ?

## 2021-08-27 NOTE — Assessment & Plan Note (Signed)
-   Check TSH continue home medications at current dose ° °

## 2021-08-27 NOTE — Assessment & Plan Note (Signed)
-   will replace and repeat in AM,  check magnesium level and replace as needed ° °

## 2021-08-27 NOTE — Assessment & Plan Note (Signed)
-  chronic avoid nephrotoxic medications such as NSAIDs, Vanco Zosyn combo,  avoid hypotension, continue to follow renal function  

## 2021-08-27 NOTE — Progress Notes (Signed)
Pharmacy Antibiotic Note ? ?Lauren [name] is a 41 y.o. female admitted on 08/27/2021 with  diabetic foot infection .  Pharmacy has been consulted for cefepime and vancomycin dosing. ? ?CrCL ~ 37 mL/min  ? ?Plan: ?-Cefepime 2 gm IV Q 12 hours ?-Vancomycin 2 gm IV load followed by Vancomycin 1500 mg IV Q 48 hrs. Goal AUC 400-550. ?Expected AUC: 540 ?SCr used: 2.33 ?-Monitor CBC, renal fx, cultures and clinical progress ?-Vanc levels at steady state ? ? ?Height: 5\' 5"  (165.1 cm) ?Weight: 97.5 kg (215 lb) ?IBW/kg (Calculated) : 57 ? ?Temp (24hrs), Avg:99.6 ?F (37.6 ?C), Min:99.6 ?F (37.6 ?C), Max:99.6 ?F (37.6 ?C) ? ?Recent Labs  ?Lab 08/27/21 ?2018  ?WBC 16.8*  ?CREATININE 2.33*  ?LATICACIDVEN 1.1  ?  ?Estimated Creatinine Clearance: 37.5 mL/min (A) (by C-G formula based on SCr of 2.33 mg/dL (H)).   ? ?Allergies  ?Allergen Reactions  ? Wellbutrin [Bupropion] Hives  ? Ciprofloxacin Hcl Hives and Rash  ?  Hives/rash at injection site   ? Tape Rash  ? ? ?Antimicrobials this admission: ?Cefepime 4/27 >>  ?Vancomycin 4/27 >>  ?Flagyl 4/27 >>  ? ?Dose adjustments this admission: ? ? ?Microbiology results: ? ?Thank you for allowing pharmacy to be a part of this patient?s care. ? ?Albertina Parr, PharmD., BCCCP ?Clinical Pharmacist ?Please refer to AMION for unit-specific pharmacist  ? ? ?

## 2021-08-27 NOTE — Assessment & Plan Note (Addendum)
-  admit per? cellulitis protocol will  ?     continue current antibiotic choice vanc,cefepime, flagyl ?     plain films showed:   no evidence of air no evidence of osteomyelitis  no   foreign   Objects ?Will order MRI of the foot ?Keep NPO ?Hold Plavix  ?    Will obtain MRSA screening,  ?   obtain blood cultures ?if febrile or septic ?    further antibiotic adjustment pending above results ? ?

## 2021-08-27 NOTE — Assessment & Plan Note (Addendum)
Order sliding scale diabetes coordinator consult continue semglee but slightly decreased doses patient being n.p.o. ?

## 2021-08-27 NOTE — Assessment & Plan Note (Addendum)
Currently appears to be euvolemic on  Lasix for tonight avoid over aggressive fluid resuscitation  ?

## 2021-08-27 NOTE — ED Notes (Signed)
Nurse will get labs from iv ?

## 2021-08-27 NOTE — H&P (Signed)
? ? ?Lauren [name] FIE:[id number] DOB: 1981-02-15 DOA: 08/27/2021 ? ? ?  ?PCP: Lemmie Evens, MD   ?Outpatient Specialists:  ?Podiatry McDonald ?CardiologyJonathan Dale Presho, MD  ?Patient arrived to ER on 08/27/21 at 1943 ?Referred by Attending Carmin Muskrat, MD ? ? ?Patient coming from:   ? home Lives With family ?  ? ?Chief Complaint:   ?Chief Complaint  ?Patient presents with  ? Foot Pain  ? ? ?HPI: ?Lauren [name] is a 41 y.o. female with medical history significant of DM1, HTN, chronic systolic CHF, hypothyroidism, CKD stage IIIa, HLD, class II obesity, CAD, history of CVA ?  ? ?Presented with progressive redness swelling left foot ?The ulcer developed as a callus a few months ago and began to blister. PCP took drainage from this and sent for culture and showed no infection. Pt had MRI no clear evidence of osteomyelitis they placed her on antibiotics which she just finished doxycycline.  pt is diabetic dietary recently try to switch her to Keflex.  Patient is on Plavix ?  ?Now she developed chills and redness x 1 week, emesis ?  ?Last foot MRI was in February 2023 ?No etoh or tobacco ?  ? ?Lab Results  ?Component Value Date  ? [place] NEGATIVE 06/18/2021  ? [place] NEGATIVE 06/18/2020  ? [place] NEGATIVE 12/31/2019  ? [place] Not Detected 03/07/2019  ? ?  ?Regarding pertinent Chronic problems:   ? ? Hyperlipidemia -  on   fenofibrate ?Lipid Panel  ?   ?Component Value Date/Time  ? CHOL 100 06/13/2020 1043  ? TRIG 109 06/13/2020 1043  ? HDL 23 (L) 06/13/2020 1043  ? CHOLHDL 4.3 06/13/2020 1043  ? VLDL 22 06/13/2020 1043  ? LDLCALC 55 06/13/2020 1043  ? LDLDIRECT 84.9 01/01/2020 0319  ? ? ? HTN on isosorbide metoprolol hydralazine ? ? chronic CHF diastolic/systolic/ combined - last echo : 05/26/2020 ?EF 35-40% with grade 3 DD ?only uses lasix prn  ? ? CAD  - On  statin,  Plavix  ?               -  followed by cardiology ?               - last cardiac cath did not show any large vessel disease  no intervention medical management only ? ? ?  DM 1-  ?Lab Results  ?Component Value Date  ? HGBA1C 9.8 (H) 06/19/2021  ? on insulin,   ? ? Hypothyroidism:  ?Lab Results  ?Component Value Date  ? TSH 0.268 (L) 06/13/2020  ? on synthroid ? ? obesity-   ?BMI Readings from Last 1 Encounters:  ?08/27/21 35.78 kg/m?  ?  ? ?  Hx of CVA -  with  residual deficits numbness on left side of the face on   Plavix ?  ? CKD stage IIIb- baseline Cr 2.2 ?Estimated Creatinine Clearance: 37.5 mL/min (A) (by C-G formula based on SCr of 2.33 mg/dL (H)). ? ?Lab Results  ?Component Value Date  ? CREATININE 2.33 (H) 08/27/2021  ? CREATININE 2.25 (H) 06/19/2021  ? CREATININE 3.06 (H) 06/18/2021  ?  ? ?While in ER: ?  ?Noted to have evidence of left lower extremity swelling redness and ulceration ?Started on Zosyn and vancomycin plain images done ?Ulcer lateral to the fifth MTP joint. No definite acute osseous ?abnormality. ?Meeting criteria for sepsis ? ?Following Medications were ordered in ER: ?Medications  ?vancomycin (VANCOCIN) IVPB 1000 mg/200 mL premix (has no administration in time range)  ?  piperacillin-tazobactam (ZOSYN) IVPB 3.375 g (has no administration in time range)  ?   ?  ?ED Triage Vitals  ?Enc Vitals Group  ?   BP 08/27/21 1948 (!) 141/89  ?   Pulse Rate 08/27/21 1948 (!) 102  ?   Resp 08/27/21 1948 16  ?   Temp 08/27/21 1948 99.6 ?F (37.6 ?C)  ?   Temp Source 08/27/21 1948 Oral  ?   SpO2 08/27/21 1948 98 %  ?   Weight 08/27/21 1957 215 lb (97.5 kg)  ?   Height 08/27/21 1957 5\' 5"  (1.651 m)  ?   Head Circumference --   ?   Peak Flow --   ?   Pain Score 08/27/21 1957 7  ?   Pain Loc --   ?   Pain Edu? --   ?   Excl. in [place]? --   ?TIWP(80)@    ? _________________________________________ ?Significant initial  Findings: ?Abnormal Labs Reviewed  ?CBC - Abnormal; Notable for the following components:  ?    Result Value  ? WBC 16.8 (*)   ? All other components within normal limits  ?BASIC METABOLIC PANEL - Abnormal; Notable for  the following components:  ? Sodium 134 (*)   ? Potassium 3.3 (*)   ? Glucose, Bld 354 (*)   ? BUN 29 (*)   ? Creatinine, Ser 2.33 (*)   ? Calcium 7.8 (*)   ? GFR, Estimated 27 (*)   ? All other components within normal limits  ?SEDIMENTATION RATE - Abnormal; Notable for the following components:  ? Sed Rate 80 (*)   ? All other components within normal limits  ?C-REACTIVE PROTEIN - Abnormal; Notable for the following components:  ? CRP 19.3 (*)   ? All other components within normal limits  ? ?   ?ECG: Ordered ?   ?____________________ ?This patient meets SIRS Criteria and may be septic. ?  ? ? ?The recent clinical data is shown below. ?Vitals:  ? 08/27/21 1948 08/27/21 1957  ?BP: (!) 141/89   ?Pulse: (!) 102   ?Resp: 16   ?Temp: 99.6 ?F (37.6 ?C)   ?TempSrc: Oral   ?SpO2: 98%   ?Weight:  97.5 kg  ?Height:  5\' 5"  (1.651 m)  ? ?  ?WBC ? ?   ?Component Value Date/Time  ? WBC 16.8 (H) 08/27/2021 2018  ? LYMPHSABS 1.5 06/18/2021 0750  ? MONOABS 0.9 06/18/2021 0750  ? EOSABS 0.2 06/18/2021 0750  ? BASOSABS 0.1 06/18/2021 0750  ? ? ?   ? ?Lactic Acid, Venous ?   ?Component Value Date/Time  ? LATICACIDVEN 1.1 08/27/2021 2018  ? ?  ? ? UA not ordered ?  ______________________________________ ?Hospitalist was called for admission for   Sepsis without acute organ dysfunction, due to unspecified organism [facility]) ?   ?The following Work up has been ordered so far: ? ?Orders Placed This Encounter  ?Procedures  ? DG Foot Complete Left  ? CBC  ? Basic metabolic panel  ? Sedimentation rate  ? C-reactive protein  ? Consult to hospitalist  ? Airborne and Contact precautions  ?  ? ?OTHER Significant initial  Findings: ? ?labs showing: ? ?  ?Recent Labs  ?Lab 08/27/21 ?2018  ?NA 134*  ?K 3.3*  ?CO2 23  ?GLUCOSE 354*  ?BUN 29*  ?CREATININE 2.33*  ?CALCIUM 7.8*  ? ? ?Cr    stable,    ?Lab Results  ?Component Value Date  ? CREATININE 2.33 (H)  08/27/2021  ? CREATININE 2.25 (H) 06/19/2021  ? CREATININE 3.06 (H) 06/18/2021  ? ? ?No results  for input(s): AST, ALT, ALKPHOS, BILITOT, PROT, ALBUMIN in the last 168 hours. ?Lab Results  ?Component Value Date  ? CALCIUM 7.8 (L) 08/27/2021  ? ?    ?   ?Plt: ?Lab Results  ?Component Value Date  ? PLT 369 08/27/2021  ? ? ? ?  ?COVID-19 Labs ? ?Recent Labs  ?  08/27/21 ?2018  ?CRP 19.3*  ? ? ?Lab Results  ?Component Value Date  ? [place] NEGATIVE 06/18/2021  ? [place] NEGATIVE 06/18/2020  ? [place] NEGATIVE 12/31/2019  ? [place] Not Detected 03/07/2019  ? ?  ?   ?Recent Labs  ?Lab 08/27/21 ?2018  ?WBC 16.8*  ?HGB 13.3  ?HCT 38.9  ?MCV 90.0  ?PLT 369  ? ? ?HG/HCT  stable,    ?   ?Component Value Date/Time  ? HGB 13.3 08/27/2021 2018  ? HCT 38.9 08/27/2021 2018  ? MCV 90.0 08/27/2021 2018  ? ? ?  ? ?Cardiac Panel (last 3 results) ?No results for input(s): CKTOTAL, CKMB, TROPONINI, RELINDX in the last 72 hours. ? .car  ? ?DM  labs:  ?HbA1C: ?Recent Labs  ?  06/19/21 ?0407  ?HGBA1C 9.8*  ? ?  ?  ?CBG (last 3)  ?No results for input(s): GLUCAP in the last 72 hours. ?  ? ?    ?Cultures: ?   ?Component Value Date/Time  ? SDES  01/01/2020 0853  ?  URINE, CLEAN CATCH ?Performed at [facility], [address], [place], [place] [address] ?  ? SPECREQUEST  01/01/2020 0853  ?  NONE ?Performed at [facility], [address], [place], [place] [address] ?  ? CULT (A) 01/01/2020 0853  ?  <10,000 COLONIES/mL INSIGNIFICANT GROWTH ?Performed at [facility], [place] [address], [place], [place] [address] ?  ? REPTSTATUS 01/02/2020 FINAL 01/01/2020 0853  ? ?  ?Radiological Exams on Admission: ?DG Foot Complete Left ? ?Result Date: 08/27/2021 ?CLINICAL DATA:  Ulcer EXAM: LEFT FOOT - COMPLETE 3+ VIEW COMPARISON:  06/18/2021 FINDINGS: No fracture or malalignment. Vascular calcifications. Ulcer adjacent to the fifth MTP joint. No periostitis or osseous destructive change. There is diffuse soft tissue edema. IMPRESSION: Ulcer lateral to the fifth MTP joint. No definite acute osseous abnormality. Electronically Signed    By: Donavan Foil M.D.   On: 08/27/2021 20:41   ?_______________________________________________________________________________________________________ ?Latest  Blood pressure (!) 141/89, pulse (!) 102, te

## 2021-08-27 NOTE — Assessment & Plan Note (Signed)
Continue statin and restart beta-blocker when able to tolerate hold Plavix for tonight ?

## 2021-08-27 NOTE — Subjective & Objective (Signed)
The ulcer developed as a callus a few months ago and began to blister. PCP took drainage from this and sent for culture and showed no infection. Pt had MRI no clear evidence of osteomyelitis they placed her on antibiotics which she just finished doxycycline.  pt is diabetic dietary recently try to switch her to Keflex.  Patient is on Plavix ?  ?Now she developed chills and redness x 1 week, emesis ?

## 2021-08-27 NOTE — Assessment & Plan Note (Addendum)
Chronic stable continue home medications lipitor ?

## 2021-08-28 ENCOUNTER — Inpatient Hospital Stay (HOSPITAL_COMMUNITY): Payer: No Typology Code available for payment source

## 2021-08-28 DIAGNOSIS — E11628 Type 2 diabetes mellitus with other skin complications: Secondary | ICD-10-CM

## 2021-08-28 DIAGNOSIS — L98499 Non-pressure chronic ulcer of skin of other sites with unspecified severity: Secondary | ICD-10-CM | POA: Diagnosis not present

## 2021-08-28 DIAGNOSIS — I10 Essential (primary) hypertension: Secondary | ICD-10-CM | POA: Diagnosis not present

## 2021-08-28 DIAGNOSIS — L03119 Cellulitis of unspecified part of limb: Secondary | ICD-10-CM

## 2021-08-28 DIAGNOSIS — N1832 Chronic kidney disease, stage 3b: Secondary | ICD-10-CM | POA: Diagnosis not present

## 2021-08-28 DIAGNOSIS — I5042 Chronic combined systolic (congestive) and diastolic (congestive) heart failure: Secondary | ICD-10-CM | POA: Diagnosis not present

## 2021-08-28 DIAGNOSIS — L97521 Non-pressure chronic ulcer of other part of left foot limited to breakdown of skin: Secondary | ICD-10-CM | POA: Diagnosis not present

## 2021-08-28 LAB — PROTIME-INR
INR: 1.1 (ref 0.8–1.2)
Prothrombin Time: 13.6 seconds (ref 11.4–15.2)

## 2021-08-28 LAB — URINALYSIS, COMPLETE (UACMP) WITH MICROSCOPIC
Bacteria, UA: NONE SEEN
Bilirubin Urine: NEGATIVE
Glucose, UA: >=500 mg/dL — AB
Hgb urine dipstick: NEGATIVE
Ketones, ur: NEGATIVE mg/dL
Leukocytes,Ua: NEGATIVE
Nitrite: NEGATIVE
Protein, ur: >=300 mg/dL — AB
Specific Gravity, Urine: 1.016 (ref 1.005–1.030)
pH: 7 (ref 5.0–8.0)

## 2021-08-28 LAB — GLUCOSE, CAPILLARY
Glucose-Capillary: 73 mg/dL (ref 70–99)
Glucose-Capillary: 61 mg/dL — ABNORMAL LOW (ref 70–99)
Glucose-Capillary: 169 mg/dL — ABNORMAL HIGH (ref 70–99)
Glucose-Capillary: 266 mg/dL — ABNORMAL HIGH (ref 70–99)
Glucose-Capillary: 176 mg/dL — ABNORMAL HIGH (ref 70–99)
Glucose-Capillary: 235 mg/dL — ABNORMAL HIGH (ref 70–99)

## 2021-08-28 LAB — TSH: TSH: 25.117 u[IU]/mL — ABNORMAL HIGH (ref 0.350–4.500)

## 2021-08-28 LAB — COMPREHENSIVE METABOLIC PANEL
ALT: 13 U/L (ref 0–44)
AST: 11 U/L — ABNORMAL LOW (ref 15–41)
Albumin: 2 g/dL — ABNORMAL LOW (ref 3.5–5.0)
Alkaline Phosphatase: 119 U/L (ref 38–126)
Anion gap: 9 (ref 5–15)
BUN: 26 mg/dL — ABNORMAL HIGH (ref 6–20)
CO2: 20 mmol/L — ABNORMAL LOW (ref 22–32)
Calcium: 7.5 mg/dL — ABNORMAL LOW (ref 8.9–10.3)
Chloride: 106 mmol/L (ref 98–111)
Creatinine, Ser: 2.19 mg/dL — ABNORMAL HIGH (ref 0.44–1.00)
GFR, Estimated: 29 mL/min — ABNORMAL LOW (ref >60)
Glucose, Bld: 69 mg/dL — ABNORMAL LOW (ref 70–99)
Potassium: 3.4 mmol/L — ABNORMAL LOW (ref 3.5–5.1)
Sodium: 135 mmol/L (ref 135–145)
Total Bilirubin: 0.7 mg/dL (ref 0.3–1.2)
Total Protein: 5.7 g/dL — ABNORMAL LOW (ref 6.5–8.1)

## 2021-08-28 LAB — CBC
HCT: 36.8 % (ref 36.0–46.0)
Hemoglobin: 11.9 g/dL — ABNORMAL LOW (ref 12.0–15.0)
MCH: 29.8 pg (ref 26.0–34.0)
MCHC: 32.3 g/dL (ref 30.0–36.0)
MCV: 92 fL (ref 80.0–100.0)
Platelets: 385 10*3/uL (ref 150–400)
RBC: 4 MIL/uL (ref 3.87–5.11)
RDW: 12.4 % (ref 11.5–15.5)
WBC: 16.9 10*3/uL — ABNORMAL HIGH (ref 4.0–10.5)
nRBC: 0 % (ref 0.0–0.2)

## 2021-08-28 LAB — MRSA NEXT GEN BY PCR, NASAL: MRSA by PCR Next Gen: NOT DETECTED

## 2021-08-28 LAB — MAGNESIUM: Magnesium: 2 mg/dL (ref 1.7–2.4)

## 2021-08-28 LAB — CK: Total CK: 54 U/L (ref 38–234)

## 2021-08-28 LAB — HEPATIC FUNCTION PANEL
ALT: 15 U/L (ref 0–44)
AST: 13 U/L — ABNORMAL LOW (ref 15–41)
Albumin: 2 g/dL — ABNORMAL LOW (ref 3.5–5.0)
Alkaline Phosphatase: 134 U/L — ABNORMAL HIGH (ref 38–126)
Bilirubin, Direct: 0.2 mg/dL (ref 0.0–0.2)
Indirect Bilirubin: 0.4 mg/dL (ref 0.3–0.9)
Total Bilirubin: 0.6 mg/dL (ref 0.3–1.2)
Total Protein: 6 g/dL — ABNORMAL LOW (ref 6.5–8.1)

## 2021-08-28 LAB — APTT: aPTT: 32 seconds (ref 24–36)

## 2021-08-28 LAB — PHOSPHORUS: Phosphorus: 4.2 mg/dL (ref 2.5–4.6)

## 2021-08-28 LAB — PROCALCITONIN: Procalcitonin: 0.55 ng/mL

## 2021-08-28 LAB — PREALBUMIN: Prealbumin: 7.3 mg/dL — ABNORMAL LOW (ref 18–38)

## 2021-08-28 IMAGING — MR MR FOOT*L* W/O CM
4 of 6 series · 19 of 40 positions shown · non-contrast
Comparison: Radiograph [DATE], MRI [DATE]

CLINICAL DATA: Foot swelling, diabetic, osteomyelitis suspected,
xray done

EXAM:
MRI OF THE LEFT FOOT WITHOUT CONTRAST
TECHNIQUE: Multiplanar, multisequence MR imaging of the left forefoot was
performed. No intravenous contrast was administered.

[Series 3: T1 · oblique · 3.0mm · 0.29mm/px · 3 of 42 slices shown]
[im 6/42]
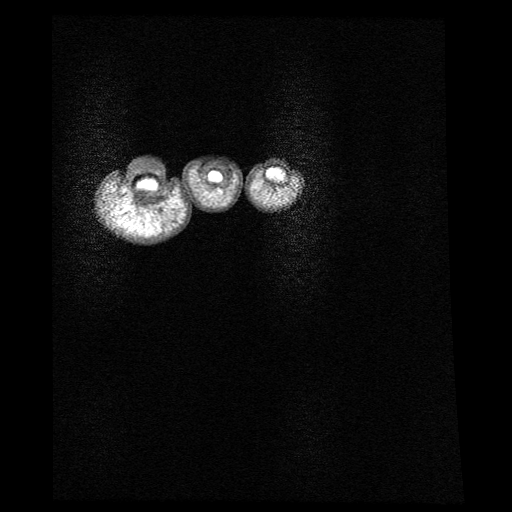
[im 24/42]
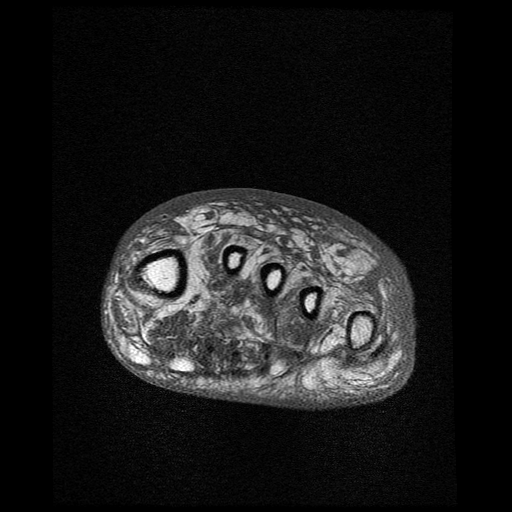
[im 36/42]
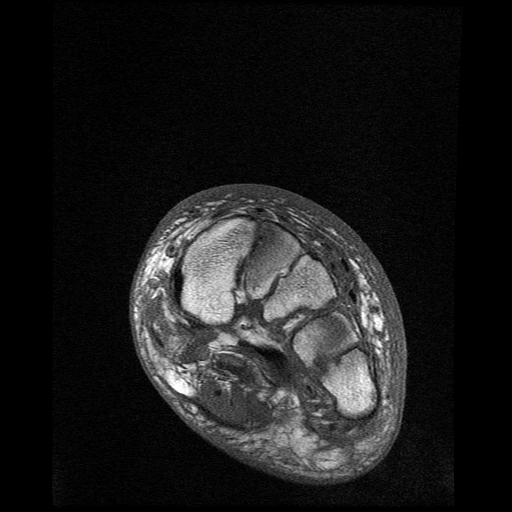

[Series 4: T1 fat-sat · oblique · non-contrast · 3.0mm · 0.29mm/px · 3 of 42 slices shown]
[im 6/42]
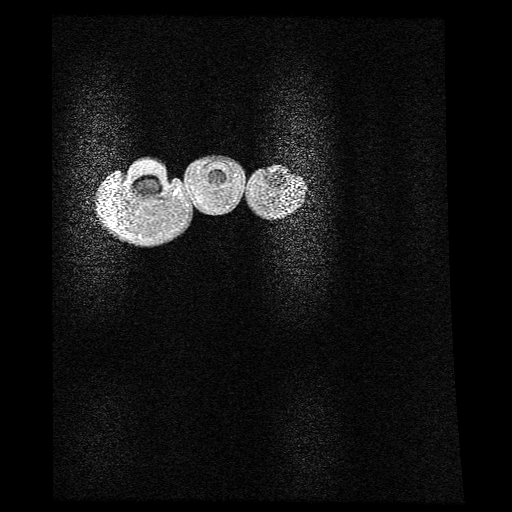
[im 24/42]
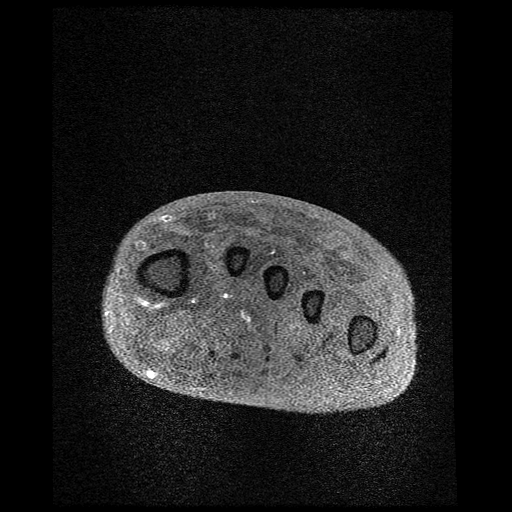
[im 36/42]
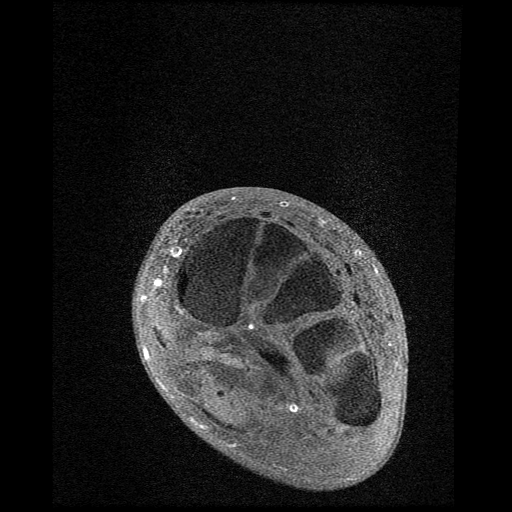

[Series 5: T2 fat-sat · oblique · 3.0mm · 0.29mm/px · 8 of 42 slices shown (1 of 2)]
[im 1/42]
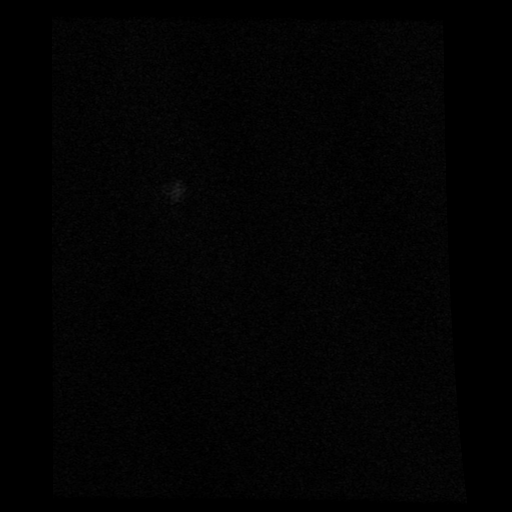
[im 6/42]
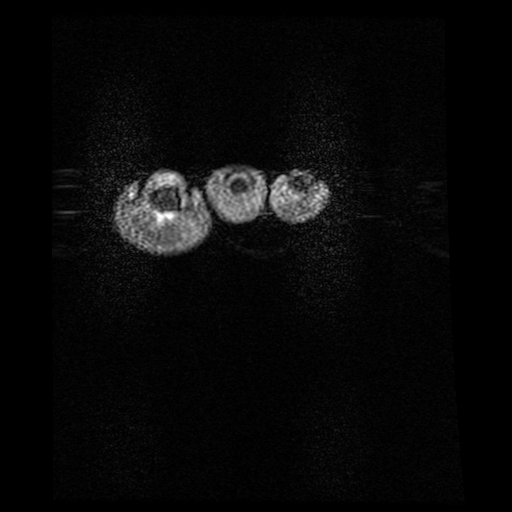
[im 12/42]
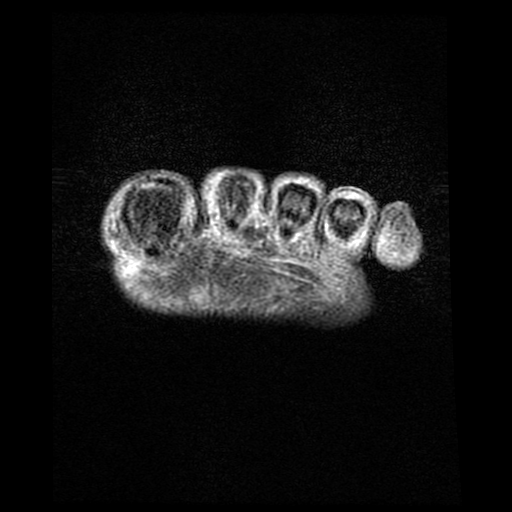
[im 18/42]
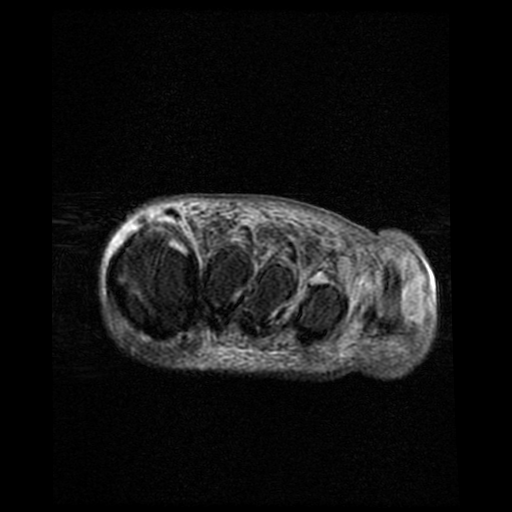
[im 24/42]
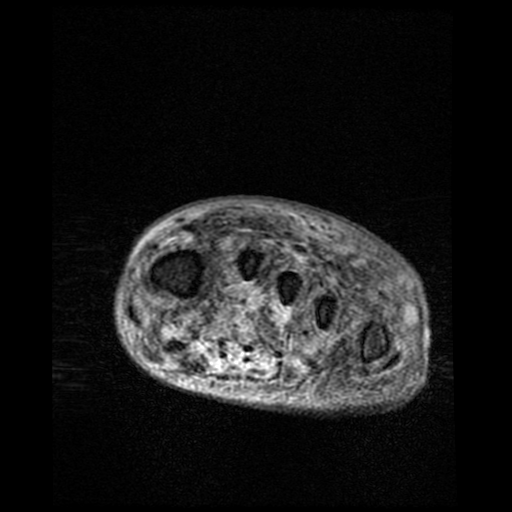
[im 30/42]
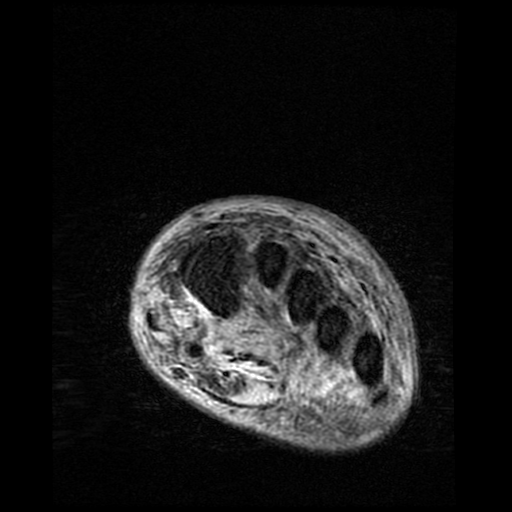
[im 36/42]
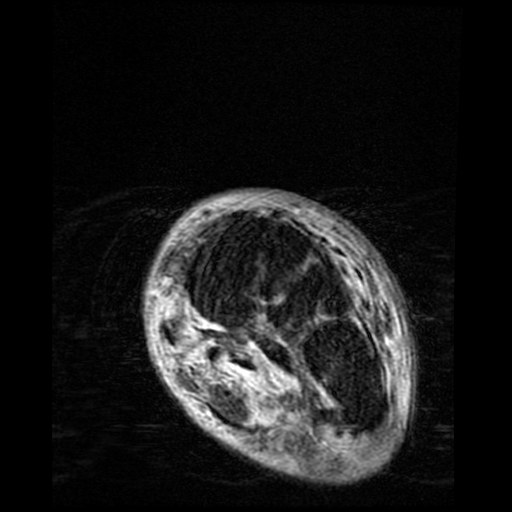
[im 42/42]
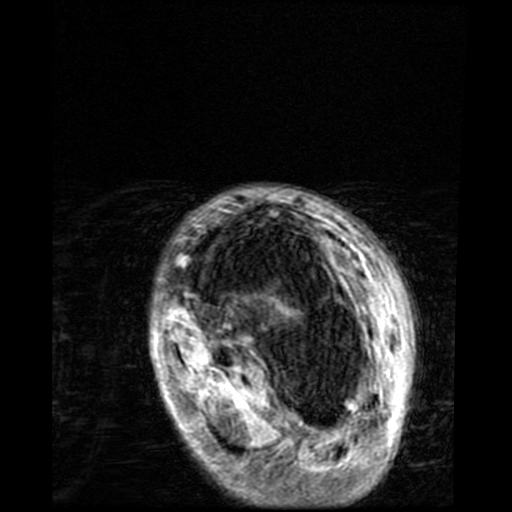

[Series 7: T2 fat-sat · oblique · 3.0mm · 0.29mm/px · 5 of 26 slices shown (2 of 2)]
[im 1/26]
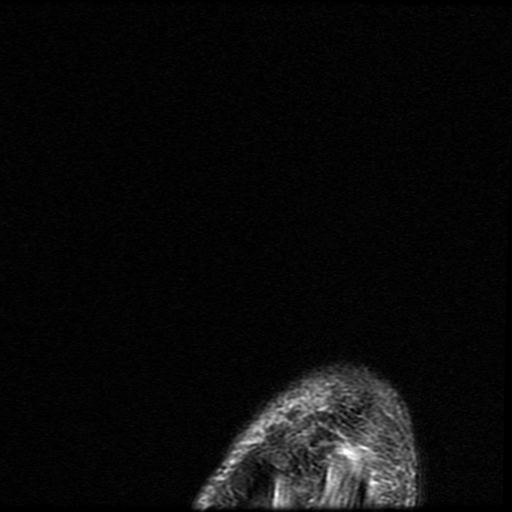
[im 7/26]
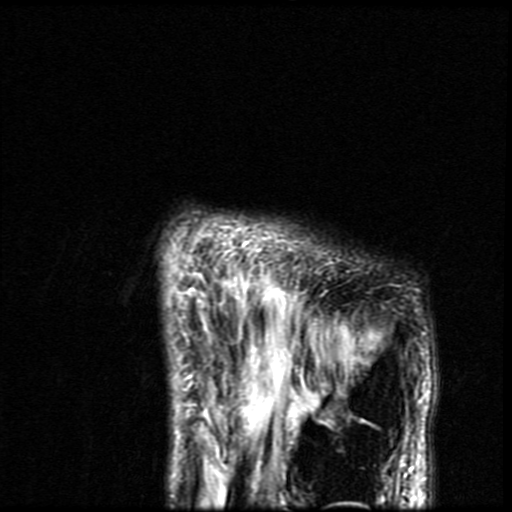
[im 13/26]
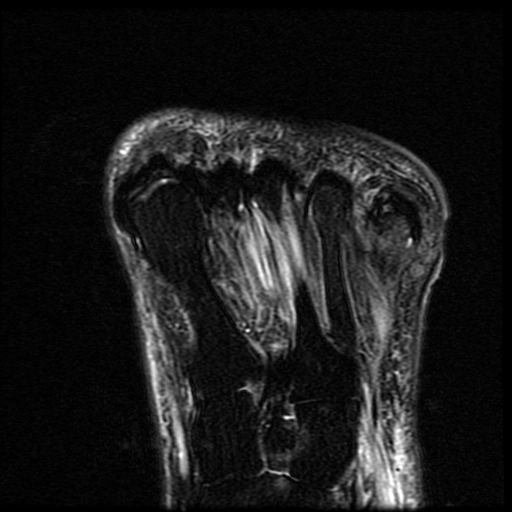
[im 19/26]
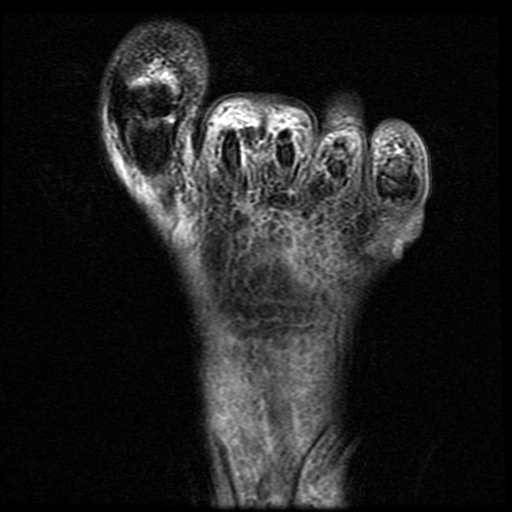
[im 26/26]
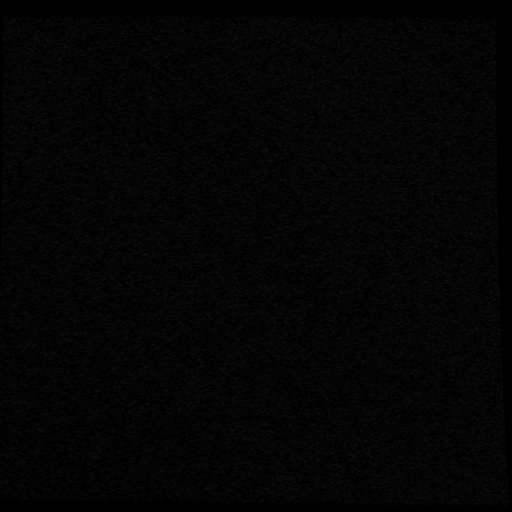

[19 of 40 positions shown; findings below may reference images not displayed]

FINDINGS: Bones/Joint/Cartilage

No acute fracture. There is minimal marrow edema in the fifth
metatarsal head with preserved T1 marrow signal.

Ligaments

Intact Lisfranc ligament.

Muscles and Tendons

There is diffuse intramuscular edema and muscle atrophy in the foot
as is commonly seen in diabetics. No acute tendon tear.

Soft tissues

There is new heterogeneous T2 hyperintense/T1 hypointense material
along the dorsal and lateral aspect of the lateral forefoot at the
level of the fifth MTP joint/distal fifth metatarsal, measuring up
to 3.2 cm in width (series 7, image 16), 1.5 cm short axis dorsally
and 1.0 cm short axis laterally (series 5, images 21 and 24). There
is no adjacent soft tissue ulcer. Persistent sliver of fluid along
the plantar lateral forefoot soft tissues measuring up to 2 mm short
axis.
IMPRESSION: New heterogeneous material along the dorsal and lateral aspect of
the lateral forefoot at the level of the fifth MTP joint/distal
fifth metatarsal, measuring 1.5 cm short axis dorsally and 1.0 cm
short axis laterally, overall 3.2 cm in width, likely representing
phlegmon/developing abscess with adjacent soft tissue ulcer.
Persistent sliver of fluid measuring 1-2 mm along the plantar
lateral forefoot.

Very mild adjacent marrow edema in the fifth metatarsal head with
preserved T1 signal, could reflect reactive marrow change or early
developing osteomyelitis.

## 2021-08-28 MED ORDER — FENOFIBRATE 160 MG PO TABS
160.0000 mg | ORAL_TABLET | Freq: Every day | ORAL | Status: DC
  Administered 2021-08-28 – 2021-09-05 (×9): 160 mg via ORAL
  Filled 2021-08-28 (×9): qty 1

## 2021-08-28 MED ORDER — INSULIN GLARGINE-YFGN 100 UNIT/ML ~~LOC~~ SOLN
25.0000 [IU] | Freq: Every day | SUBCUTANEOUS | Status: DC
  Administered 2021-08-28 – 2021-08-29 (×2): 25 [IU] via SUBCUTANEOUS
  Filled 2021-08-28 (×4): qty 0.25

## 2021-08-28 MED ORDER — LEVOTHYROXINE SODIUM 100 MCG PO TABS
200.0000 ug | ORAL_TABLET | Freq: Every day | ORAL | Status: DC
  Administered 2021-08-28 – 2021-09-05 (×8): 200 ug via ORAL
  Filled 2021-08-28 (×9): qty 2

## 2021-08-28 MED ORDER — POTASSIUM CHLORIDE CRYS ER 20 MEQ PO TBCR
40.0000 meq | EXTENDED_RELEASE_TABLET | Freq: Once | ORAL | Status: AC
  Administered 2021-08-28: 40 meq via ORAL
  Filled 2021-08-28: qty 2

## 2021-08-28 MED ORDER — HEPARIN SODIUM (PORCINE) 5000 UNIT/ML IJ SOLN
5000.0000 [IU] | Freq: Three times a day (TID) | INTRAMUSCULAR | Status: AC
  Administered 2021-08-28 (×2): 5000 [IU] via SUBCUTANEOUS
  Filled 2021-08-28 (×2): qty 1

## 2021-08-28 MED ORDER — INSULIN GLARGINE-YFGN 100 UNIT/ML ~~LOC~~ SOLN
35.0000 [IU] | Freq: Two times a day (BID) | SUBCUTANEOUS | Status: DC
  Administered 2021-08-28: 35 [IU] via SUBCUTANEOUS
  Filled 2021-08-28 (×5): qty 0.35

## 2021-08-28 MED ORDER — INSULIN ASPART 100 UNIT/ML IJ SOLN
0.0000 [IU] | Freq: Every day | INTRAMUSCULAR | Status: DC
  Administered 2021-08-28: 2 [IU] via SUBCUTANEOUS
  Administered 2021-08-29 – 2021-08-31 (×3): 3 [IU] via SUBCUTANEOUS
  Administered 2021-09-01: 2 [IU] via SUBCUTANEOUS
  Administered 2021-09-04: 5 [IU] via SUBCUTANEOUS

## 2021-08-28 MED ORDER — ADULT MULTIVITAMIN W/MINERALS CH
1.0000 | ORAL_TABLET | Freq: Every day | ORAL | Status: DC
  Administered 2021-08-28 – 2021-09-05 (×9): 1 via ORAL
  Filled 2021-08-28 (×8): qty 1

## 2021-08-28 MED ORDER — FENTANYL CITRATE PF 50 MCG/ML IJ SOSY: 25.0000 ug | PREFILLED_SYRINGE | INTRAMUSCULAR | Status: DC | PRN

## 2021-08-28 MED ORDER — FUROSEMIDE 40 MG PO TABS
40.0000 mg | ORAL_TABLET | Freq: Every day | ORAL | Status: DC
  Administered 2021-08-28 – 2021-08-30 (×3): 40 mg via ORAL
  Filled 2021-08-28 (×3): qty 1

## 2021-08-28 MED ORDER — JUVEN PO PACK
1.0000 | PACK | Freq: Two times a day (BID) | ORAL | Status: DC
  Administered 2021-08-29 – 2021-08-31 (×4): 1 via ORAL
  Filled 2021-08-28 (×7): qty 1

## 2021-08-28 MED ORDER — PANTOPRAZOLE SODIUM 40 MG PO TBEC
40.0000 mg | DELAYED_RELEASE_TABLET | Freq: Every day | ORAL | Status: DC
  Administered 2021-08-28 – 2021-09-05 (×9): 40 mg via ORAL
  Filled 2021-08-28 (×9): qty 1

## 2021-08-28 MED ORDER — FENTANYL CITRATE PF 50 MCG/ML IJ SOSY
25.0000 ug | PREFILLED_SYRINGE | INTRAMUSCULAR | Status: DC | PRN
  Administered 2021-08-30: 50 ug via INTRAVENOUS
  Administered 2021-08-30 – 2021-09-03 (×4): 25 ug via INTRAVENOUS
  Administered 2021-09-03: 50 ug via INTRAVENOUS
  Filled 2021-08-28 (×6): qty 1

## 2021-08-28 MED ORDER — INSULIN ASPART 100 UNIT/ML IJ SOLN
0.0000 [IU] | Freq: Three times a day (TID) | INTRAMUSCULAR | Status: DC
  Administered 2021-08-28: 3 [IU] via SUBCUTANEOUS
  Administered 2021-08-28: 8 [IU] via SUBCUTANEOUS
  Administered 2021-08-29: 3 [IU] via SUBCUTANEOUS
  Administered 2021-08-29: 5 [IU] via SUBCUTANEOUS
  Administered 2021-08-29: 3 [IU] via SUBCUTANEOUS
  Administered 2021-08-30: 5 [IU] via SUBCUTANEOUS
  Administered 2021-08-30: 15 [IU] via SUBCUTANEOUS
  Administered 2021-08-31: 5 [IU] via SUBCUTANEOUS
  Administered 2021-08-31: 3 [IU] via SUBCUTANEOUS
  Administered 2021-08-31: 2 [IU] via SUBCUTANEOUS
  Administered 2021-09-01 (×2): 5 [IU] via SUBCUTANEOUS
  Administered 2021-09-02 – 2021-09-03 (×2): 2 [IU] via SUBCUTANEOUS
  Administered 2021-09-03: 3 [IU] via SUBCUTANEOUS
  Administered 2021-09-05: 15 [IU] via SUBCUTANEOUS

## 2021-08-28 MED ORDER — ATORVASTATIN CALCIUM 40 MG PO TABS
40.0000 mg | ORAL_TABLET | Freq: Every day | ORAL | Status: DC
  Administered 2021-08-28 – 2021-09-05 (×9): 40 mg via ORAL
  Filled 2021-08-28 (×9): qty 1

## 2021-08-28 MED ORDER — METOPROLOL SUCCINATE ER 25 MG PO TB24
25.0000 mg | ORAL_TABLET | Freq: Every day | ORAL | Status: DC
Start: 2021-08-28 — End: 2021-09-05
  Administered 2021-08-28 – 2021-09-05 (×9): 25 mg via ORAL
  Filled 2021-08-28 (×9): qty 1

## 2021-08-28 MED ORDER — INSULIN GLARGINE-YFGN 100 UNIT/ML ~~LOC~~ SOLN
15.0000 [IU] | Freq: Every day | SUBCUTANEOUS | Status: DC
  Administered 2021-08-28 – 2021-08-30 (×3): 15 [IU] via SUBCUTANEOUS
  Filled 2021-08-28 (×3): qty 0.15

## 2021-08-28 NOTE — Consult Note (Signed)
? ?  [facility] CM Inpatient Consult ? ? ?08/28/2021 ? ?Lauren [name] ?1980/05/17 ?[id number] ? ? [place] Patient: [place] plan ? ?Primary Care Provider:  Lemmie Evens, MD ? ? ?Patient will be assigned to a [place] Management for telephonic chronic disease management services and community support..  ?  ?Met with patient in room, patient up eating lunch.  Explained [facility] RN follow up for post hospital transition of care. Patient verbalized she has not been with [facility] 12 months and not having FMLA benefits. ?   ? Plan: Patient will be followed by [place] Coordinator. Continue to follow for progress and post hospital needs, TOC/disposition unknown at this time. ?  ?For additional questions or referrals please contact: ?  ?Natividad Brood, RN BSN CCM ?[facility] Liaison ? [phone] business mobile phone ?Toll free office [phone]  ?Fax number: [phone] ?[email or web address] ? ? ?

## 2021-08-28 NOTE — TOC Initial Note (Signed)
Transition of Care (TOC) - Initial/Assessment Note  ? ? ?Patient Details  ?Name: Lauren [name] ?MRN: [id number] ?Date of Birth: July 07, 1980 ? ?Transition of Care (TOC) CM/SW Contact:    ?Tom-Johnson, Renea Ee, RN ?Phone Number: ?08/28/2021, 2:47 PM ? ?Clinical Narrative:                 ? ?Patient is admitted for Lt foot ulcer, Podiatry consulted. On IV abx.  ?No PT/OT f/u noted. Family to transport at discharge. CM will continue to follow with needs. ? ?Expected Discharge Plan: Home/Self Care ?Barriers to Discharge: Continued Medical Work up ? ? ?Patient Goals and CMS Choice ?Patient states their goals for this hospitalization and ongoing recovery are:: To return home ?CMS Medicare.gov Compare Post Acute Care list provided to:: Patient ?Choice offered to / list presented to : NA ? ?Expected Discharge Plan and Services ?Expected Discharge Plan: Home/Self Care ?  ?Discharge Planning Services: CM Consult ?Post Acute Care Choice: NA ?Living arrangements for the past 2 months: [place] ?                ?DME Arranged: N/A ?DME Agency: NA ?  ?  ?  ?  ?[place] Agency: NA ?  ?  ?  ? ?Prior Living Arrangements/Services ?Living arrangements for the past 2 months: [place] ?  ?Patient language and need for interpreter reviewed:: Yes ?Do you feel safe going back to the place where you live?: Yes      ?Need for Family Participation in Patient Care: Yes (Comment) ?  ?  ?Criminal Activity/Legal Involvement Pertinent to Current Situation/Hospitalization: No - Comment as needed ? ?Activities of Daily Living ?Home Assistive Devices/Equipment: None ?ADL Screening (condition at time of admission) ?Patient's cognitive ability adequate to safely complete daily activities?: Yes ?Is the patient deaf or have difficulty hearing?: No ?Does the patient have difficulty seeing, even when wearing glasses/contacts?: No ?Does the patient have difficulty concentrating, remembering, or making decisions?: No ?Patient able to  express need for assistance with ADLs?: Yes ?Does the patient have difficulty dressing or bathing?: No ?Independently performs ADLs?: Yes (appropriate for developmental age) ?Does the patient have difficulty walking or climbing stairs?: No ?Weakness of Legs: None ?Weakness of Arms/Hands: None ? ?Permission Sought/Granted ?Permission sought to share information with : Case Manager, Family Supports ?Permission granted to share information with : Yes, Verbal Permission Granted ?   ?   ?   ?   ? ?Emotional Assessment ?Appearance:: Appears stated age ?Attitude/Demeanor/Rapport: Engaged, Gracious ?Affect (typically observed): Accepting, Appropriate, Calm, Hopeful ?Orientation: : Oriented to Self, Oriented to Place, Oriented to  Time, Oriented to Situation ?Alcohol / Substance Use: Not Applicable ?Psych Involvement: No (comment) ? ?Admission diagnosis:  Foot ulcer ([place]) [V78.469] ?Sepsis without acute organ dysfunction, due to unspecified organism [facility]) [A41.9] ?Patient Active Problem List  ? Diagnosis Date Noted  ? Foot ulcer ([place]) 08/27/2021  ? Sepsis ([place]) 08/27/2021  ? Hypokalemia 08/27/2021  ? Cellulitis 06/18/2021  ? Chronic combined systolic and diastolic CHF (congestive heart failure) ([place]) 06/18/2021  ? History of CVA (cerebrovascular accident) 06/18/2021  ? Encounter for cervical Pap smear with pelvic exam 07/23/2020  ? Menorrhagia with regular cycle 07/23/2020  ? Iron deficiency anemia due to chronic blood loss 07/23/2020  ? Menstrual cramps 07/23/2020  ? Class 2 obesity 06/18/2020  ? CAD (coronary artery disease) 06/18/2020  ? CKD stage G3b/A2, GFR 30-44 and albumin creatinine ratio 30-299 mg/g ([place]) 06/18/2020  ? Unstable angina ([place]) 06/18/2020  ?  Mixed hyperlipidemia   ? Class 2 obesity due to excess calories without serious comorbidity with body mass index (BMI) of 39.0 to 39.9 in adult   ? Acute renal failure superimposed on stage 3a chronic kidney disease ([place]) 01/01/2020  ? Acute ischemic stroke ([place])  01/01/2020  ? Tobacco abuse 01/01/2020  ? Chest pain 12/31/2019  ? Cerebrovascular accident (CVA) ([place]) 12/31/2019  ? Left sided numbness 12/31/2019  ? Vitamin D deficiency 07/24/2015  ? Essential hypertension 03/17/2015  ? 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 Hypothyroidism 03/07/2015  ? ?PCP:  Lemmie Evens, MD ?Pharmacy:   ?[facility] [address] ?[place] [address] ?Phone: [phone] Fax: [phone] ? ? ? ? ?Social Determinants of Health (SDOH) Interventions ?  ? ?Readmission Risk Interventions ?   ? View : No data to display.  ?  ?  ?  ? ? ? ?

## 2021-08-28 NOTE — Progress Notes (Signed)
ABI's have been completed. ?Preliminary results can be found in CV Proc through chart review.  ? ?08/28/21 11:01 AM ?Lauren [name] RVT   ?

## 2021-08-28 NOTE — Evaluation (Signed)
Physical Therapy Evaluation & Discharge ?Patient Details ?Name: Lauren [name] ?MRN: [id number] ?DOB: 03/25/1981 ?Today's Date: 08/28/2021 ? ?History of Present Illness ? 41 y/o female admitted on 08/27/21 for diabetic ulcer on left foot x 3 months with concern for infection. Found to have L foot cellulitis with concern for abscess/osteomyelitis. PMH: T1DM, HTN, CHF, CKD stage IIIa, CAD, hx of CVA  ?Clinical Impression ? Patient admitted with the above findings. Patient functioning at modI level for mobility with no AD. Educated patient on WB restrictions with use of surgical shoe and keeping L LE elevated. No further skilled PT needs identified acutely. No PT follow up recommended at this time.    ?   ? ?Recommendations for follow up therapy are one component of a multi-disciplinary discharge planning process, led by the attending physician.  Recommendations may be updated based on patient status, additional functional criteria and insurance authorization. ? ?Follow Up Recommendations No PT follow up ? ?  ?Assistance Recommended at Discharge None  ?Patient can return home with the following ?   ? ?  ?Equipment Recommendations None recommended by PT  ?Recommendations for Other Services ?    ?  ?Functional Status Assessment Patient has not had a recent decline in their functional status  ? ?  ?Precautions / Restrictions Precautions ?Precautions: None ?Required Braces or Orthoses: Other Brace ?Other Brace: surgical shoe ?Restrictions ?Weight Bearing Restrictions: Yes ?LLE Weight Bearing: Weight bearing as tolerated ?Other Position/Activity Restrictions: WBAT in surgical shoe  ? ?  ? ?Mobility ? Bed Mobility ?Overal bed mobility: Independent ?  ?  ?  ?  ?  ?  ?  ?  ? ?Transfers ?Overall transfer level: Modified independent ?  ?  ?  ?  ?  ?  ?  ?  ?  ?  ? ?Ambulation/Gait ?Ambulation/Gait assistance: Modified independent (Device/Increase time) ?Gait Distance (Feet): 200 Feet ?Assistive device: IV Pole ?Gait  Pattern/deviations: Step-through pattern, Decreased stance time - left ?  ?Gait velocity interpretation: >2.62 ft/sec, indicative of community ambulatory ?  ?  ? ?Stairs ?Stairs: Yes ?Stairs assistance: Modified independent (Device/Increase time) ?Stair Management: One rail Left, Step to pattern, Forwards ?Number of Stairs: 2 ?  ? ?Wheelchair Mobility ?  ? ?Modified Rankin (Stroke Patients Only) ?  ? ?  ? ?Balance Overall balance assessment: Independent ?  ?  ?  ?  ?  ?  ?  ?  ?  ?  ?  ?  ?  ?  ?  ?  ?  ?  ?   ? ? ? ?Pertinent Vitals/Pain Pain Assessment ?Pain Assessment: 0-10 ?Pain Score: 7  ?Pain Location: L foot ?Pain Descriptors / Indicators: Grimacing, Discomfort, Sore, Stabbing ?Pain Intervention(s): Limited activity within patient's tolerance, Monitored during session, Repositioned  ? ? ?Home Living Family/patient expects to be discharged to:: Private residence ?Living Arrangements: Spouse/significant other;Children (13) ?Available Help at Discharge: Family;Available 24 hours/day ?Type of Home: Mobile home ?Home Access: Stairs to enter ?Entrance Stairs-Rails: Right;Left ?Entrance Stairs-Number of Steps: 5 ?  ?Home Layout: One level ?Home Equipment: None ?   ?  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  ?  ?  ?  ?  ?  ?  ?  ?  ? ? ?Hand Dominance  ? Dominant Hand: Right ? ?  ?Extremity/Trunk Assessment  ? Upper Extremity Assessment ?Upper Extremity Assessment: Defer to OT evaluation ?  ? ?Lower Extremity Assessment ?Lower Extremity Assessment: Overall [facility]  for tasks assessed (L foot pain) ?  ? ?Cervical / Trunk Assessment ?Cervical / Trunk Assessment: Normal  ?Communication  ? Communication: No difficulties  ?Cognition Arousal/Alertness: Awake/alert ?Behavior During Therapy: [facility] for tasks assessed/performed ?Overall Cognitive Status: Within Functional Limits for tasks assessed ?  ?  ?  ?  ?  ?  ?  ?  ?  ?  ?  ?  ?  ?  ?  ?  ?  ?  ?   ? ?  ?General Comments General comments (skin integrity, edema, etc.): husband present throughout session ? ?  ?Exercises    ? ?Assessment/Plan  ?  ?PT Assessment Patient does not need any further PT services  ?PT Problem List   ? ?   ?  ?PT Treatment Interventions     ? ?PT Goals (Current goals can be found in the Care Plan section)  ?Acute Rehab PT Goals ?Patient Stated Goal: to go home ?PT Goal Formulation: All assessment and education complete, DC therapy ? ?  ?Frequency   ?  ? ? ?Co-evaluation   ?  ?  ?  ?  ? ? ?  ?AM-PAC PT "6 Clicks" Mobility  ?Outcome Measure Help needed turning from your back to your side while in a flat bed without using bedrails?: None ?Help needed moving from lying on your back to sitting on the side of a flat bed without using bedrails?: None ?Help needed moving to and from a bed to a chair (including a wheelchair)?: None ?Help needed standing up from a chair using your arms (e.g., wheelchair or bedside chair)?: None ?Help needed to walk in hospital room?: None ?Help needed climbing 3-5 steps with a railing? : None ?6 Click Score: 24 ? ?  ?End of Session   ?Activity Tolerance: Patient tolerated treatment well ?Patient left: in chair;with call bell/phone within reach;with family/visitor present ?Nurse Communication: Mobility status ?PT Visit Diagnosis: Muscle weakness (generalized) (M62.81) ?  ? ?Time: 9675-9163 ?PT Time Calculation (min) (ACUTE ONLY): 15 min ? ? ?Charges:   PT Evaluation ?$PT Eval Low Complexity: 1 Low ?  ?  ?   ? ? ?Krimson Massmann A. Gilford Rile, PT, DPT ?Acute Rehabilitation Services ?Pager [phone] ?Office [phone] ? ? ?Alphonse Asbridge A Harshal Sirmon ?08/28/2021, 11:21 AM ? ?

## 2021-08-28 NOTE — Progress Notes (Signed)
Initial Nutrition Assessment ? ?DOCUMENTATION CODES:  ? ?Not applicable ? ?INTERVENTION:  ? ?-1 packet Juven BID, each packet provides 95 calories, 2.5 grams of protein (collagen), and 9.8 grams of carbohydrate (3 grams sugar); also contains 7 grams of L-arginine and L-glutamine, 300 mg vitamin C, 15 mg vitamin E, 1.2 mcg vitamin B-12, 9.5 mg zinc, 200 mg calcium, and 1.5 g  Calcium Beta-hydroxy-Beta-methylbutyrate to support wound healing ? ?MVI with minerals daily ? ?Messaged case manager to assist pt with signing up for Active Health Management services through [facility].  ? ? ?NUTRITION DIAGNOSIS:  ? ?Increased nutrient needs related to wound healing as evidenced by estimated needs. ? ?GOAL:  ? ?Patient will meet greater than or equal to 90% of their needs ? ?MONITOR:  ? ?PO intake, Supplement acceptance ? ?REASON FOR ASSESSMENT:  ? ?Consult ?Assessment of nutrition requirement/status, Wound healing ? ?ASSESSMENT:  ? ?Pt with PMH of DM type 1, HTN, CHF, hypothyroidism, CKD stage III, HLD, class II obesity, CAD, CVA admitted with cellulitis of the L foot with DM ulcer.  ? ?MRI pending to determine presence of osteomyelitis. Podiatry consulted for possible surgery.  ? ?Spoke with pt about her hx of type 1 DM. She lost insurance and her blood sugars were high as she was having difficulty getting insulin. At this time she was on a pump. Without insurance she went to the health department and was told she was not able to keep her pump. She was transitioned to insulin 40 units long acting BID and SSI for CBG correction as well as for CHO intake. 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  ?She reports that she has been able to lose 50 lb by choosing healthy diet. She was around 250 lb and is now down to around 200 lb.  ?She has had her wound for a couple of weeks but is new for  her. Reports that she has not had a wound before.   ?She does not normally take a MVI but is willing to try this as well as Juven supplements.  ? ?Medications reviewed and include: fenofibrate, lasix, SSI, semglee, synthroid, protoinx, KCl  ?Flagyl  ? ?Labs reviewed: K 3.4, CRP: 19.3 ?A1C: 12 (2017), 10.3 (2021), 9.5 (2022), 9.8 (06/2021) ?CBG's: 61-266 ?  ? ?NUTRITION - FOCUSED PHYSICAL EXAM: ? ?Flowsheet Row Most Recent Value  ?Orbital Region No depletion  ?Upper Arm Region No depletion  ?Thoracic and Lumbar Region No depletion  ?Buccal Region No depletion  ?Temple Region No depletion  ?Clavicle Bone Region No depletion  ?Clavicle and Acromion Bone Region No depletion  ?Scapular Bone Region No depletion  ?Dorsal Hand No depletion  ?Patellar Region No depletion  ?Anterior Thigh Region No depletion  ?Posterior Calf Region No depletion  ?Edema (RD Assessment) Moderate  [LLE]  ?Hair Reviewed  ?Eyes Reviewed  ?Mouth Reviewed  ?Skin Reviewed  ?Nails Reviewed  ? ?  ? ? ?Diet Order:   ?Diet Order   ? ?       ?  Diet NPO time specified  Diet effective midnight       ?  ?  Diet Carb Modified Fluid consistency: Thin; Room service appropriate? Yes  Diet effective now       ?  ? ?  ?  ? ?  ? ? ?EDUCATION NEEDS:  ? ?  Education needs have been addressed ? ?Skin:  Skin Assessment: Skin Integrity Issues: ?Skin Integrity Issues:: Diabetic Ulcer ?Diabetic Ulcer: L foot ? ?Last BM:  unknown ? ?Height:  ? ?Ht Readings from Last 1 Encounters:  ?08/28/21 5\' 5"  (1.651 m)  ? ? ?Weight:  ? ?Wt Readings from Last 1 Encounters:  ?08/28/21 94.4 kg  ? ? ?BMI:  Body mass index is 34.63 kg/m?. ? ?Estimated Nutritional Needs:  ? ?Kcal:  1900-2100 ? ?Protein:  95-115 grams ? ?Fluid:  > 1.9 L/day ? ?Lockie Pares., RD, LDN, CNSC ?See AMiON for contact information  ? ?

## 2021-08-28 NOTE — Progress Notes (Signed)
Pt feeling like her blood sugar is low.  CBG 61 checked.  Pt given ample juice and crackers.  Will relay to the oncoming RN. ?

## 2021-08-28 NOTE — Consult Note (Signed)
?  Subjective:  ?Patient ID: Lauren [name], female    DOB: December 16, 1980,  MRN: [id number] ? ?A 41 y.o. female  medical history significant of DM1, HTN, chronic systolic CHF, hypothyroidism, CKD stage IIIa, HLD, class II obesity, CAD, history of CVA presents with left foot redness/cellulitis and swelling.  Patient states that it has been present for quite some time she was experiencing nausea fever chills vomiting so came to the emergency room.  He was being treated by [name] in outpatient setting and has failed oral antibiotics.  He denies any other acute complaints ? ?Objective:  ? ?Vitals:  ? 08/28/21 0301 08/28/21 0500  ?BP: (!) 126/58 126/77  ?Pulse: 85 80  ?Resp: 20 18  ?Temp: 99.1 ?F (37.3 ?C) 98.3 ?F (36.8 ?C)  ?SpO2: 98% 95%  ? ?General AA&O x3. Normal mood and affect.  ?Vascular Dorsalis pedis and posterior tibial pulses 2/4 bilat. ?Brisk capillary refill to all digits. Pedal hair present.  ?Neurologic Epicritic sensation grossly intact.  ?Dermatologic Left submetatarsal 5 wound with plantarflexed metatarsal with the redness and swelling all the way up to the midfoot up to the ankle.  No area of fluctuance noted at this time.  No purulent drainage was expressed.  Probing down to bone noted.  ?Orthopedic: MMT 5/5 in dorsiflexion, plantarflexion, inversion, and eversion. ?Normal joint ROM without pain or crepitus.  ? ? ? ?Assessment & Plan:  ?Patient was evaluated and treated and all questions answered. ? ?Left foot cellulitis with concern for abscess/osteomyelitis ?-All questions and concerns were discussed with the patient in extensive detail ?-Given the amount of redness is present I believe patient will benefit from an MRI evaluation to assess for new developing abscess/osteomyelitis. ?-Patient is an uncontrolled diabetic and is a high risk of losing the foot versus the leg. ?-I will place an order for ABIs PVRs just to assess the vascular flow.  She does have faintly palpable pulses. ?-Local wound  care with Betadine wet-to-dry dressing for now ?-We will plan on going the operating room Saturday versus Sunday based on the MRI findings ?-Weightbearing as tolerated with a surgical shoe ? ?[name], DPM ? ?Accessible via secure chat for questions or concerns. ? ?

## 2021-08-28 NOTE — Progress Notes (Signed)
? ?[facility] ?PROGRESS NOTE ? ? ?Lauren [name] [id number]:[id number] DOB: July 20, 1980 DOA: 08/27/2021 ? ?PCP: Lemmie Evens, MD ? ?Brief History/Interval Summary: 41 y.o. female with medical history significant of DM1, HTN, chronic systolic CHF, hypothyroidism, CKD stage IIIa, HLD, class II obesity, CAD, history of CVA presented with progressive worsening of the redness in her left foot.  Pain also worsened over several days prior to admission.  Patient was found to have evidence for cellulitis.  She was noted to have an ulcer with concern for osteomyelitis.  She was hospitalized for further management.  Placed on IV antibiotics. ? ?Consultants: Podiatry ? ?Procedures: ABIs pending. ? ? ? ?Subjective/Interval History: ?Patient complains of 6 out of 10 pain in the left foot.  Has been red in color warm to touch.  Low blood glucose noted this morning.   ? ? ? ?Assessment/Plan: ? ?Cellulitis of the left foot with diabetic ulcer ?No clear evidence for sepsis at the time of admission. ?Patient currently on broad-spectrum antibiotics with vancomycin cefepime and metronidazole. ?Podiatry has been consulted.  MRI foot is pending.  Ordered due to concern for abscess and osteomyelitis. ?Plain films did not show any osseous abnormality.  WBC is noted to be elevated at 16.9.  She is afebrile for the most part. ?Does not have bounding pedal pulses.  ABIs ordered by podiatry. ?Follow-up on cultures.  ESR noted to be elevated at 80.  CRP 19.3.  Procalcitonin 0.55.  Lactic acid level was normal. ? 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  Noted to be hypoglycemic this morning.  Was given snacks with correction.  We will cut back on the dose of her insulin.  Changed to ACHS coverage.  HbA1c 9.8 in February. ? ?Essential hypertension ?Blood pressure reasonably well controlled.  She is noted to be only on furosemide currently. ? ?Chronic kidney disease stage IIIb ?Baseline  creatinine is between 2.2-3.0.  Avoid nephrotoxic agents.  Monitor renal functions closely.  Monitor urine output. ? ?Hypothyroidism ?TSH noted to be quite elevated at 25.  Mentions that she has been compliant with her thyroid medications.  Will increase the dose of her levothyroxine and recheck TSH in a few weeks. ? ?Hyperlipidemia ?Continue statin and fenofibrate. ? ?History of stroke ?Plavix has been placed on hold just in case she requires surgery.  Continue statin. ? ?Coronary artery disease ?Cardiac status seems to be stable.  Continue statin.  Home medication list reviewed.  Plavix currently on hold.  Metoprolol can be resumed. ? ?Chronic combined systolic and diastolic CHF ?Last echocardiogram report is from January 2022 which revealed EF of 35 to 40% with grade 3 diastolic dysfunction.  Seems to be reasonably well compensated.  Continue furosemide.  Vania Rea is on hold.  Resume her metoprolol.  No ACE inhibitor or ARB or Entresto due to elevated creatinine. ?Strict ins and outs and daily weights. ? ?Hypokalemia ?Will be repleted.  Magnesium 2.0. ? ?Obesity ?Estimated body mass index is 34.63 kg/m? as calculated from the following: ?  Height as of this encounter: 5' 5" (1.651 m). ?  Weight as of this encounter: 94.4 kg. ? ?DVT Prophylaxis: Initiate subcutaneous heparin.  Will hold after tonight's dose case she undergoes procedure tomorrow. ?Code Status: Full code ?Family Communication: Discussed with patient ?Disposition Plan: Hopefully return home when improved ? ?Status is: Inpatient ?Remains inpatient appropriate because: Left foot cellulitis with concern for osteomyelitis.  Requiring IV antibiotics. ? ? ? ? ? ?  Medications: Scheduled: ? atorvastatin  40 mg Oral Daily  ? fenofibrate  160 mg Oral Daily  ? furosemide  40 mg Oral Daily  ? insulin aspart  0-9 Units Subcutaneous Q4H  ? insulin glargine-yfgn  15 Units Subcutaneous Daily  ? insulin glargine-yfgn  25 Units Subcutaneous QHS  ? pantoprazole  40 mg  Oral Daily  ? potassium chloride  40 mEq Oral Once  ? ?Continuous: ? ceFEPime (MAXIPIME) IV 2 g (08/28/21 0902)  ? metronidazole Stopped (08/28/21 0117)  ? [START ON 08/29/2021] vancomycin    ? ?SKA:[id number] **OR** acetaminophen, fentaNYL (SUBLIMAZE) injection, HYDROcodone-acetaminophen ? ?Antibiotics: ?Anti-infectives (From admission, onward)  ? ? Start     Dose/Rate Route Frequency Ordered Stop  ? 08/29/21 2200  vancomycin (VANCOREADY) IVPB 1500 mg/300 mL       ? 1,500 mg ?150 mL/hr over 120 Minutes Intravenous Every 48 hours 08/27/21 2320 09/08/21 2159  ? 08/27/21 2330  ceFEPIme (MAXIPIME) 2 g in sodium chloride 0.9 % 100 mL IVPB       ? 2 g ?200 mL/hr over 30 Minutes Intravenous Every 12 hours 08/27/21 2320 09/03/21 2159  ? 08/27/21 2330  vancomycin (VANCOREADY) IVPB 2000 mg/400 mL       ? 2,000 mg ?200 mL/hr over 120 Minutes Intravenous  Once 08/27/21 2320 08/28/21 0349  ? 08/27/21 2315  metroNIDAZOLE (FLAGYL) IVPB 500 mg       ? 500 mg ?100 mL/hr over 60 Minutes Intravenous Every 12 hours 08/27/21 2305 09/03/21 2314  ? 08/27/21 2200  vancomycin (VANCOCIN) IVPB 1000 mg/200 mL premix  Status:  Discontinued       ? 1,000 mg ?200 mL/hr over 60 Minutes Intravenous  Once 08/27/21 2152 08/27/21 2320  ? 08/27/21 2200  piperacillin-tazobactam (ZOSYN) IVPB 3.375 g  Status:  Discontinued       ? 3.375 g ?100 mL/hr over 30 Minutes Intravenous  Once 08/27/21 2152 08/27/21 2322  ? ?  ? ? ?Objective: ? ?Vital Signs ? ?Vitals:  ? 08/28/21 0100 08/28/21 0127 08/28/21 0301 08/28/21 0500  ?BP: (!) 147/84  (!) 126/58 126/77  ?Pulse: 91  85 80  ?Resp: _0 ?Temp: 100.2 ?F (37.9 ?C)  99.1 ?F (37.3 ?C) 98.3 ?F (36.8 ?C)  ?TempSrc: Oral  Oral Oral  ?SpO2: 97%  98% 95%  ?Weight:  94.4 kg    ?Height:  5' 5" (1.651 m)    ? ? ?Intake/Output Summary (Last 24 hours) at 08/28/2021 0953 ?Last data filed at 08/28/2021 0900 ?Gross per 24 hour  ?Intake 240 ml  ?Output 550 ml  ?Net -310 ml  ? ?Filed Weights  ? 08/27/21 1957 08/28/21 0127   ?Weight: 97.5 kg 94.4 kg  ? ? ?General appearance: Awake alert.  In no distress ?Resp: Clear to auscultation bilaterally.  Normal effort ?Cardio: S1-S2 is normal regular.  No S3-S4.  No rubs murmurs or bruit ?GI: Abdomen is soft.  Nontender nondistended.  Bowel sounds are present normal.  No masses organomegaly ?Extremities: Left foot covered in dressing.  Pulses are very poorly palpable.  Erythema is noted.  Tender.  Warm to touch. ?Neurologic: Alert and oriented x3.  No focal neurological deficits.  ? ? ?Lab Results: ? ?Data Reviewed: I have personally reviewed following labs and reports of the imaging studies ? ?CBC: ?Recent Labs  ?Lab 08/27/21 ?2018 08/28/21 ?5974  ?WBC 16.8* 16.9*  ?HGB 13.3 11.9*  ?HCT 38.9 36.8  ?MCV 90.0 92.0  ?PLT 369 385  ? ? ?  Basic Metabolic Panel: ?Recent Labs  ?Lab 08/27/21 ?2018 08/28/21 ?0001 08/28/21 ?0343  ?NA 134*  --  135  ?K 3.3*  --  3.4*  ?CL 101  --  106  ?CO2 23  --  20*  ?GLUCOSE 354*  --  69*  ?BUN 29*  --  26*  ?CREATININE 2.33*  --  2.19*  ?CALCIUM 7.8*  --  7.5*  ?MG  --  2.0  --   ?PHOS  --  4.2  --   ? ? ?GFR: ?Estimated Creatinine Clearance: 39.2 mL/min (A) (by C-G formula based on SCr of 2.19 mg/dL (H)). ? ?Liver Function Tests: ?Recent Labs  ?Lab 08/28/21 ?0001 08/28/21 ?0343  ?AST 13* 11*  ?ALT 15 13  ?ALKPHOS 134* 119  ?BILITOT 0.6 0.7  ?PROT 6.0* 5.7*  ?ALBUMIN 2.0* 2.0*  ? ? ? ?Coagulation Profile: ?Recent Labs  ?Lab 08/28/21 ?0001  ?INR 1.1  ? ? ?Cardiac Enzymes: ?Recent Labs  ?Lab 08/28/21 ?0001  ?CKTOTAL 54  ? ? ? ?CBG: ?Recent Labs  ?Lab 08/27/21 ?2332 08/28/21 ?0347 08/28/21 ?0627 08/28/21 ?0739  ?GLUCAP 327* 73 61* 169*  ? ? ? ?Thyroid Function Tests: ?Recent Labs  ?  08/28/21 ?0343  ?TSH 25.117*  ? ? ? ?Recent Results (from the past 240 hour(s))  ?Culture, blood (x 2)     Status: None (Preliminary result)  ? Collection Time: 08/27/21 10:53 PM  ? Specimen: BLOOD  ?Result Value Ref Range Status  ? Specimen Description BLOOD LEFT ANTECUBITAL  Final  ?  Special Requests   Final  ?  BOTTLES DRAWN AEROBIC AND ANAEROBIC Blood Culture adequate volume  ? Culture   Final  ?  NO GROWTH < 12 HOURS ?Performed at [facility], [address] [place]

## 2021-08-28 NOTE — Progress Notes (Signed)
Inpatient Diabetes Program Recommendations ? ?AACE/ADA: New Consensus Statement on Inpatient Glycemic Control (2015) ? ?Target Ranges:  Prepandial:   less than 140 mg/dL ?     Peak postprandial:   less than 180 mg/dL (1-2 hours) ?     Critically ill patients:  140 - 180 mg/dL  ? ?Lab Results  ?Component Value Date  ? GLUCAP 266 (H) 08/28/2021  ? HGBA1C 9.8 (H) 06/19/2021  ? ? ?Review of Glycemic Control ? Latest Reference Range & Units 08/27/21 23:32 08/28/21 03:47 08/28/21 06:27 08/28/21 07:39 08/28/21 11:23  ?Glucose-Capillary 70 - 99 mg/dL 327 (H) ?Novolog 7 units 73 61 (L) 169 (H) ?Novolog 2 units 266 (H)  ?(H): Data is abnormally high ?(L): Data is abnormally low ? ?Diabetes history: DM1 ?Outpatient Diabetes medications: Semglee 40 units bid, Novolog 2-10 tid meal coverage, ? Jardiance 10 mg qd ?Current orders for Inpatient glycemic control: Semglee 25 units hs, 15 units am, Novolog 0-15 units tid, 0-5 units hs ? ?Inpatient Diabetes Program Recommendations:   ?May consider decrease in Novolog correction to 0-9 units tid + hs 0-5 units and change to q 4 hrs. If NPO status continues tomorrow. ?Attempted to clarify if patient is still taking Jardiance, but patient is in MRI. ? ?Thank you, ?Nani Gasser Theone Bowell, RN, MSN, CDE  ?Diabetes Coordinator ?Inpatient Glycemic Control Team ?Team Pager [phone] (8am-5pm) ?08/28/2021 12:15 PM ? ? ? ? ?

## 2021-08-28 NOTE — Evaluation (Signed)
Occupational Therapy Evaluation and Discharge ?Patient Details ?Name: Lauren [name] ?MRN: [id number] ?DOB: [birth year] ?Today's Date: 08/28/2021 ? ? ?History of Present Illness 41 y/o female admitted on 08/27/21 for diabetic ulcer on left foot x 3 months with concern for infection. Found to have L foot cellulitis with concern for abscess/osteomyelitis. PMH: T1DM, HTN, CHF, CKD stage IIIa, CAD, hx of CVA  ? ?Clinical Impression ?  ?Pt typically independent in ADL and mobility. 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 Pt today is able to demonstrate UB and LB ADL at mod I level, transfers and bed mobility at independent level. Educated Pt on energy conservation and to perform ADL/IADL from seated position when possible to keep weight off of LLE when possible. Pt's husband present throughput session. Education complete, Pt with no other questions or concerns and OT to sign off at this time.  ?   ? ?Recommendations for follow up therapy are one component of a multi-disciplinary discharge planning process, led by the attending physician.  Recommendations may be updated based on patient status, additional functional criteria and insurance authorization.  ? ?Follow Up Recommendations ? No OT follow up  ?  ?Assistance Recommended at Discharge PRN  ?Patient can return home with the following   ? ?  ?Functional Status Assessment ? Patient has not had a recent decline in their functional status  ?Equipment Recommendations ? None recommended by OT  ?  ?Recommendations for Other Services   ? ? ?  ?Precautions / Restrictions Precautions ?Precautions: None ?Required Braces or Orthoses: Other Brace ?Other Brace: surgical shoe ?Restrictions ?Weight Bearing Restrictions: Yes ?LLE Weight Bearing: Weight bearing as tolerated ?Other Position/Activity Restrictions: WBAT in surgical shoe  ? ?  ? ?Mobility Bed Mobility ?Overal bed mobility: Independent ?  ?  ?  ?  ?  ?  ?  ?  ? ?Transfers ?Overall transfer level: Modified  independent ?  ?  ?  ?  ?  ?  ?  ?  ?  ?  ? ?  ?Balance Overall balance assessment: Independent ?  ?  ?  ?  ?  ?  ?  ?  ?  ?  ?  ?  ?  ?  ?  ?  ?  ?  ?   ? ?ADL either performed or assessed with clinical judgement  ? ?ADL Overall ADL's : Modified independent ?  ?  ?  ?  ?  ?  ?  ?  ?  ?  ?  ?  ?  ?  ?  ?  ?  ?  ?  ?General ADL Comments: edcuated on sitting for IADL to prevent excess pressure on LLE, Pt verbalized that she already has chair in kitchen that she uses, shower is too small. Pt given multiple seated position options on how to view LLE side of foot for cleaning/wound inspection  ? ? ? ?Vision Baseline Vision/History: 1 Wears glasses ?Ability to See in Adequate Light: 0 Adequate ?Patient Visual Report: No change from baseline ?Vision Assessment?: No apparent visual deficits  ?   ?Perception   ?  ?Praxis   ?  ? ?Pertinent Vitals/Pain Pain Assessment ?Pain Assessment: 0-10 ?Pain Score: 7  ?Pain Location: L foot ?Pain Descriptors / Indicators: Grimacing, Discomfort, Sore, Stabbing ?Pain Intervention(s): Limited activity within patient's tolerance, Monitored during session, Premedicated before session, Repositioned (elevation)  ? ? ? ?Hand Dominance Right ?  ?Extremity/Trunk Assessment Upper Extremity Assessment ?Upper Extremity Assessment: Overall  WFL for tasks assessed ?  ?Lower Extremity Assessment ?Lower Extremity Assessment: Defer to PT evaluation;Overall [facility] for tasks assessed ?  ?Cervical / Trunk Assessment ?Cervical / Trunk Assessment: Normal ?  ?Communication Communication ?Communication: No difficulties ?  ?Cognition Arousal/Alertness: Awake/alert ?Behavior During Therapy: [facility] for tasks assessed/performed ?Overall Cognitive Status: Within Functional Limits for tasks assessed ?  ?  ?  ?  ?  ?  ?  ?  ?  ?  ?  ?  ?  ?  ?  ?  ?  ?  ?  ?General Comments  husband present throughout session ? ?  ?Exercises   ?  ?Shoulder Instructions    ? ? ?Home Living Family/patient expects to be discharged to:: Private  residence ?Living Arrangements: Spouse/significant other;Children (13) ?Available Help at Discharge: Family;Available 24 hours/day ?Type of Home: Mobile home ?Home Access: Stairs to enter ?Entrance Stairs-Number of Steps: 5 ?Entrance Stairs-Rails: Right;Left ?Home Layout: One level ?  ?  ?Bathroom Shower/Tub: Walk-in shower ?  ?Bathroom Toilet: Standard ?Bathroom Accessibility: Yes ?  ?Home Equipment: None ?  ?  ?  ? ?  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  ?  ?  ?  ?  ?  ?  ?  ?  ? ?  ?  ?OT Problem List: Pain ?  ?   ?OT Treatment/Interventions:    ?  ?OT Goals(Current goals can be found in the care plan section) Acute Rehab OT Goals ?Patient Stated Goal: get infection taken care of ?OT Goal Formulation: With patient/family ?Time For Goal Achievement: 09/11/21 ?Potential to Achieve Goals: Good  ?OT Frequency:   ?  ? ?Co-evaluation   ?  ?  ?  ?  ? ?  ?AM-PAC OT "6 Clicks" Daily Activity     ?Outcome Measure Help from another person eating meals?: None ?Help from another person taking care of personal grooming?: None ?Help from another person toileting, which includes using toliet, bedpan, or urinal?: None ?Help from another person bathing (including washing, rinsing, drying)?: None ?Help from another person to put on and taking off regular upper body clothing?: None ?Help from another person to put on and taking off regular lower body clothing?: None ?6 Click Score: 24 ?  ?End of Session Equipment Utilized During Treatment: Other (comment) (surgical shoe) ?Nurse Communication: Mobility status;Weight bearing status ? ?Activity Tolerance: Patient tolerated treatment well ?Patient left: in chair;with family/visitor present (call bell within husbands) ? ?OT Visit Diagnosis: Pain ?Pain - Right/Left: Left ?Pain - part of body: Ankle and joints of foot  ?              ?Time: 5366-4403 ?OT Time Calculation (min): 15  min ?Charges:  OT General Charges ?$OT Visit: 1 Visit ?OT Evaluation ?$OT Eval Low Complexity: 1 Low ? ?Jesse Sans OTR/L ?Acute Rehabilitation Services ?Pager: [phone] ?Office: [phone] ? ?Merri Ray Malaka Ruffner ?08/28/2021, 11:11 AM ?

## 2021-08-29 DIAGNOSIS — I5042 Chronic combined systolic (congestive) and diastolic (congestive) heart failure: Secondary | ICD-10-CM | POA: Diagnosis not present

## 2021-08-29 DIAGNOSIS — L03116 Cellulitis of left lower limb: Secondary | ICD-10-CM

## 2021-08-29 DIAGNOSIS — N1832 Chronic kidney disease, stage 3b: Secondary | ICD-10-CM | POA: Diagnosis not present

## 2021-08-29 DIAGNOSIS — E876 Hypokalemia: Secondary | ICD-10-CM | POA: Diagnosis not present

## 2021-08-29 DIAGNOSIS — I10 Essential (primary) hypertension: Secondary | ICD-10-CM | POA: Diagnosis not present

## 2021-08-29 LAB — BASIC METABOLIC PANEL
Anion gap: 7 (ref 5–15)
BUN: 25 mg/dL — ABNORMAL HIGH (ref 6–20)
CO2: 23 mmol/L (ref 22–32)
Calcium: 7.9 mg/dL — ABNORMAL LOW (ref 8.9–10.3)
Chloride: 105 mmol/L (ref 98–111)
Creatinine, Ser: 2.21 mg/dL — ABNORMAL HIGH (ref 0.44–1.00)
GFR, Estimated: 28 mL/min — ABNORMAL LOW (ref >60)
Glucose, Bld: 214 mg/dL — ABNORMAL HIGH (ref 70–99)
Potassium: 3.4 mmol/L — ABNORMAL LOW (ref 3.5–5.1)
Sodium: 135 mmol/L (ref 135–145)

## 2021-08-29 LAB — URINE CULTURE

## 2021-08-29 LAB — CBC
HCT: 35.5 % — ABNORMAL LOW (ref 36.0–46.0)
Hemoglobin: 11.6 g/dL — ABNORMAL LOW (ref 12.0–15.0)
MCH: 29.7 pg (ref 26.0–34.0)
MCHC: 32.7 g/dL (ref 30.0–36.0)
MCV: 90.8 fL (ref 80.0–100.0)
Platelets: 402 10*3/uL — ABNORMAL HIGH (ref 150–400)
RBC: 3.91 MIL/uL (ref 3.87–5.11)
RDW: 12.7 % (ref 11.5–15.5)
WBC: 17.2 10*3/uL — ABNORMAL HIGH (ref 4.0–10.5)
nRBC: 0 % (ref 0.0–0.2)

## 2021-08-29 LAB — GLUCOSE, CAPILLARY
Glucose-Capillary: 188 mg/dL — ABNORMAL HIGH (ref 70–99)
Glucose-Capillary: 204 mg/dL — ABNORMAL HIGH (ref 70–99)
Glucose-Capillary: 158 mg/dL — ABNORMAL HIGH (ref 70–99)
Glucose-Capillary: 295 mg/dL — ABNORMAL HIGH (ref 70–99)

## 2021-08-29 LAB — PREGNANCY, URINE: Preg Test, Ur: NEGATIVE

## 2021-08-29 MED ORDER — ONDANSETRON HCL 4 MG/2ML IJ SOLN
4.0000 mg | Freq: Four times a day (QID) | INTRAMUSCULAR | Status: DC | PRN
  Administered 2021-08-29 – 2021-09-04 (×5): 4 mg via INTRAVENOUS
  Filled 2021-08-29 (×5): qty 2

## 2021-08-29 MED ORDER — HEPARIN SODIUM (PORCINE) 5000 UNIT/ML IJ SOLN
5000.0000 [IU] | Freq: Three times a day (TID) | INTRAMUSCULAR | Status: AC
  Filled 2021-08-29 (×2): qty 1

## 2021-08-29 MED ORDER — POTASSIUM CHLORIDE CRYS ER 20 MEQ PO TBCR
40.0000 meq | EXTENDED_RELEASE_TABLET | Freq: Once | ORAL | Status: AC
  Administered 2021-08-29: 40 meq via ORAL
  Filled 2021-08-29: qty 2

## 2021-08-29 NOTE — Progress Notes (Signed)
? ?[facility] ?PROGRESS NOTE ? ? ?Lauren [name] KKX:[id number] DOB: October 21, 1980 DOA: 08/27/2021 ? ?PCP: Lemmie Evens, MD ? ?Brief History/Interval Summary: 41 y.o. female with medical history significant of DM1, HTN, chronic systolic CHF, hypothyroidism, CKD stage IIIa, HLD, class II obesity, CAD, history of CVA presented with progressive worsening of the redness in her left foot.  Pain also worsened over several days prior to admission.  Patient was found to have evidence for cellulitis.  She was noted to have an ulcer with concern for osteomyelitis.  She was hospitalized for further management.  Placed on IV antibiotics. ? ?Consultants: Podiatry ? ?Procedures: ABIs. ? ? ? ?Subjective/Interval History: ?Pain is reasonably well controlled.  Feels slightly better.  Did have fever overnight.  No nausea vomiting.   ? ? ?Assessment/Plan: ? ?Cellulitis of the left foot with diabetic ulcer ?No clear evidence for sepsis at the time of admission. ?Patient currently on broad-spectrum antibiotics with vancomycin cefepime and metronidazole. ?Plain films did not show any osseous abnormality.  WBC is noted to be elevated .  Had fever overnight.  WBC noted to be elevated again today. ?MRI of the foot raises concern for abscess and osteomyelitis involving the fifth metatarsal head. ?Podiatry is following.  Plan is for surgical intervention tomorrow including bone biopsy. ?ABIs showed mild right lower extremity arterial disease.  The left ABI was within normal range. ?Follow-up on cultures.  ESR noted to be elevated at 80.  CRP 19.3.  Procalcitonin 0.55.  Lactic acid level was normal. ? 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  On Lantus insulin at home at 40 units twice a day.  She was noted to be hypoglycemic yesterday morning.  Dose of insulin was decreased.  CBGs are stable.  Continue to monitor.   ? ?Essential hypertension ?Blood pressure reasonably well controlled.  She is noted to be  only on furosemide currently. ? ?Chronic kidney disease stage IIIb ?Baseline creatinine is between 2.2-3.0.  Avoid nephrotoxic agents.  Monitor renal functions closely.  Monitor urine output. ? ?Hypothyroidism ?TSH noted to be quite elevated at 25.  Mentions that she has been compliant with her thyroid medications.  Dose of levothyroxine was increased.  TSH to be rechecked in a few weeks. ? ?Hyperlipidemia ?Continue statin and fenofibrate. ? ?History of stroke ?Plavix has been placed on hold just in case she requires surgery.  Continue statin. ? ?Coronary artery disease ?Cardiac status seems to be stable.  Continue statin.  Home medication list reviewed.  Plavix currently on hold.  Metoprolol resumed. ? ?Chronic combined systolic and diastolic CHF ?Last echocardiogram report is from January 2022 which revealed EF of 35 to 40% with grade 3 diastolic dysfunction.  Seems to be reasonably well compensated.  Continue furosemide.  Vania Rea is on hold.  Continue metoprolol.  No ACE inhibitor or ARB or Entresto due to elevated creatinine. ?Strict ins and outs and daily weights. ? ?Hypokalemia ?Potassium level remains low despite supplementation.  Will supplement again today.  Magnesium was 2.0 when last checked.  We will recheck tomorrow ? ?Obesity ?Estimated body mass index is 34.63 kg/m? as calculated from the following: ?  Height as of this encounter: 5' 5"  (1.651 m). ?  Weight as of this encounter: 94.4 kg. ? ?DVT Prophylaxis: Continue with subcutaneous heparin for today.  We will hold after tonight's dose.   ?Code Status: Full code ?Family Communication: Discussed with patient ?Disposition Plan: Hopefully return home when improved ? ?Status is: Inpatient ?Remains  inpatient appropriate because: Left foot cellulitis with concern for osteomyelitis.  Requiring IV antibiotics. ? ? ? ? ? ?Medications: Scheduled: ? atorvastatin  40 mg Oral Daily  ? fenofibrate  160 mg Oral Daily  ? furosemide  40 mg Oral Daily  ? insulin  aspart  0-15 Units Subcutaneous TID WC  ? insulin aspart  0-5 Units Subcutaneous QHS  ? insulin glargine-yfgn  15 Units Subcutaneous Daily  ? insulin glargine-yfgn  25 Units Subcutaneous QHS  ? levothyroxine  200 mcg Oral Q0600  ? metoprolol succinate  25 mg Oral Daily  ? multivitamin with minerals  1 tablet Oral Daily  ? nutrition supplement (JUVEN)  1 packet Oral BID BM  ? pantoprazole  40 mg Oral Daily  ? ?Continuous: ? ceFEPime (MAXIPIME) IV 2 g (08/29/21 0843)  ? metronidazole 500 mg (08/28/21 2234)  ? vancomycin    ? ?[id number] **OR** acetaminophen, fentaNYL (SUBLIMAZE) injection, HYDROcodone-acetaminophen ? ?Antibiotics: ?Anti-infectives (From admission, onward)  ? ? Start     Dose/Rate Route Frequency Ordered Stop  ? 08/29/21 2200  vancomycin (VANCOREADY) IVPB 1500 mg/300 mL       ? 1,500 mg ?150 mL/hr over 120 Minutes Intravenous Every 48 hours 08/27/21 2320 09/08/21 2159  ? 08/27/21 2330  ceFEPIme (MAXIPIME) 2 g in sodium chloride 0.9 % 100 mL IVPB       ? 2 g ?200 mL/hr over 30 Minutes Intravenous Every 12 hours 08/27/21 2320 09/03/21 2159  ? 08/27/21 2330  vancomycin (VANCOREADY) IVPB 2000 mg/400 mL       ? 2,000 mg ?200 mL/hr over 120 Minutes Intravenous  Once 08/27/21 2320 08/28/21 0349  ? 08/27/21 2315  metroNIDAZOLE (FLAGYL) IVPB 500 mg       ? 500 mg ?100 mL/hr over 60 Minutes Intravenous Every 12 hours 08/27/21 2305 09/03/21 2314  ? 08/27/21 2200  vancomycin (VANCOCIN) IVPB 1000 mg/200 mL premix  Status:  Discontinued       ? 1,000 mg ?200 mL/hr over 60 Minutes Intravenous  Once 08/27/21 2152 08/27/21 2320  ? 08/27/21 2200  piperacillin-tazobactam (ZOSYN) IVPB 3.375 g  Status:  Discontinued       ? 3.375 g ?100 mL/hr over 30 Minutes Intravenous  Once 08/27/21 2152 08/27/21 2322  ? ?  ? ? ?Objective: ? ?Vital Signs ? ?Vitals:  ? 08/28/21 1752 08/28/21 2059 08/29/21 0457 08/29/21 1517  ?BP: 138/72 138/74 129/83 132/62  ?Pulse: 97 93 92 89  ?Resp: 17 18 18 17   ?Temp: (!) 101.1 ?F (38.4 ?C) 99  ?F (37.2 ?C) 99.2 ?F (37.3 ?C) 99 ?F (37.2 ?C)  ?TempSrc: Oral Oral Oral Oral  ?SpO2: 97% 96% 98% 98%  ?Weight:      ?Height:      ? ? ?Intake/Output Summary (Last 24 hours) at 08/29/2021 1045 ?Last data filed at 08/29/2021 0458 ?Gross per 24 hour  ?Intake 1064.07 ml  ?Output 0 ml  ?Net 1064.07 ml  ? ? ?Filed Weights  ? 08/27/21 1957 08/28/21 0127  ?Weight: 97.5 kg 94.4 kg  ? ? ?General appearance: Awake alert.  In no distress ?Resp: Clear to auscultation bilaterally.  Normal effort ?Cardio: S1-S2 is normal regular.  No S3-S4.  No rubs murmurs or bruit ?GI: Abdomen is soft.  Nontender nondistended.  Bowel sounds are present normal.  No masses organomegaly ?Extremities: Left foot is in a dressing ?Neurologic: Alert and oriented x3.  No focal neurological deficits.  ? ? ?Lab Results: ? ?Data Reviewed: I have personally reviewed  following labs and reports of the imaging studies ? ?CBC: ?Recent Labs  ?Lab 08/27/21 ?2018 08/28/21 ?4619 08/29/21 ?0256  ?WBC 16.8* 16.9* 17.2*  ?HGB 13.3 11.9* 11.6*  ?HCT 38.9 36.8 35.5*  ?MCV 90.0 92.0 90.8  ?PLT 369 385 402*  ? ? ? ?Basic Metabolic Panel: ?Recent Labs  ?Lab 08/27/21 ?2018 08/28/21 ?0001 08/28/21 ?0122 08/29/21 ?0256  ?NA 134*  --  135 135  ?K 3.3*  --  3.4* 3.4*  ?CL 101  --  106 105  ?CO2 23  --  20* 23  ?GLUCOSE 354*  --  69* 214*  ?BUN 29*  --  26* 25*  ?CREATININE 2.33*  --  2.19* 2.21*  ?CALCIUM 7.8*  --  7.5* 7.9*  ?MG  --  2.0  --   --   ?PHOS  --  4.2  --   --   ? ? ? ?GFR: ?Estimated Creatinine Clearance: 38.8 mL/min (A) (by C-G formula based on SCr of 2.21 mg/dL (H)). ? ?Liver Function Tests: ?Recent Labs  ?Lab 08/28/21 ?0001 08/28/21 ?2411  ?AST 13* 11*  ?ALT 15 13  ?ALKPHOS 134* 119  ?BILITOT 0.6 0.7  ?PROT 6.0* 5.7*  ?ALBUMIN 2.0* 2.0*  ? ? ? ? ?Coagulation Profile: ?Recent Labs  ?Lab 08/28/21 ?0001  ?INR 1.1  ? ? ? ?Cardiac Enzymes: ?Recent Labs  ?Lab 08/28/21 ?0001  ?CKTOTAL 54  ? ? ? ? ?CBG: ?Recent Labs  ?Lab 08/28/21 ?0739 08/28/21 ?1123 08/28/21 ?1622  08/28/21 ?4643 08/29/21 ?1427  ?GLUCAP 169* 266* 176* 235* 188*  ? ? ? ? ?Thyroid Function Tests: ?Recent Labs  ?  08/28/21 ?6701  ?TSH 25.117*  ? ? ? ? ?Recent Results (from the past 240 hour(s))  ?Culture

## 2021-08-29 NOTE — Plan of Care (Signed)
  Problem: Education: Goal: Knowledge of General Education information will improve Description: Including pain rating scale, medication(s)/side effects and non-pharmacologic comfort measures Outcome: Progressing   Problem: Pain Managment: Goal: General experience of comfort will improve Outcome: Progressing   Problem: Skin Integrity: Goal: Risk for impaired skin integrity will decrease Outcome: Progressing   

## 2021-08-30 ENCOUNTER — Inpatient Hospital Stay (HOSPITAL_COMMUNITY): Payer: No Typology Code available for payment source | Admitting: Anesthesiology

## 2021-08-30 ENCOUNTER — Encounter (HOSPITAL_COMMUNITY): Payer: Self-pay | Admitting: Internal Medicine

## 2021-08-30 ENCOUNTER — Other Ambulatory Visit: Payer: Self-pay

## 2021-08-30 ENCOUNTER — Encounter (HOSPITAL_COMMUNITY)
Admission: EM | Disposition: A | Discharge status: Home / Self Care | Payer: Self-pay | Source: Home / Self Care | Attending: Internal Medicine

## 2021-08-30 DIAGNOSIS — I159 Secondary hypertension, unspecified: Secondary | ICD-10-CM

## 2021-08-30 DIAGNOSIS — I5022 Chronic systolic (congestive) heart failure: Secondary | ICD-10-CM

## 2021-08-30 DIAGNOSIS — S91302A Unspecified open wound, left foot, initial encounter: Secondary | ICD-10-CM

## 2021-08-30 DIAGNOSIS — I130 Hypertensive heart and chronic kidney disease with heart failure and stage 1 through stage 4 chronic kidney disease, or unspecified chronic kidney disease: Secondary | ICD-10-CM

## 2021-08-30 DIAGNOSIS — E039 Hypothyroidism, unspecified: Secondary | ICD-10-CM | POA: Diagnosis not present

## 2021-08-30 DIAGNOSIS — N1831 Chronic kidney disease, stage 3a: Secondary | ICD-10-CM

## 2021-08-30 DIAGNOSIS — I10 Essential (primary) hypertension: Secondary | ICD-10-CM | POA: Diagnosis not present

## 2021-08-30 DIAGNOSIS — I2510 Atherosclerotic heart disease of native coronary artery without angina pectoris: Secondary | ICD-10-CM

## 2021-08-30 DIAGNOSIS — E1022 Type 1 diabetes mellitus with diabetic chronic kidney disease: Secondary | ICD-10-CM

## 2021-08-30 DIAGNOSIS — L03116 Cellulitis of left lower limb: Secondary | ICD-10-CM | POA: Diagnosis not present

## 2021-08-30 DIAGNOSIS — L97509 Non-pressure chronic ulcer of other part of unspecified foot with unspecified severity: Secondary | ICD-10-CM | POA: Diagnosis not present

## 2021-08-30 DIAGNOSIS — N1832 Chronic kidney disease, stage 3b: Secondary | ICD-10-CM | POA: Diagnosis not present

## 2021-08-30 DIAGNOSIS — Z87891 Personal history of nicotine dependence: Secondary | ICD-10-CM

## 2021-08-30 HISTORY — PX: I & D EXTREMITY: SHX5045

## 2021-08-30 LAB — CBC
HCT: 34 % — ABNORMAL LOW (ref 36.0–46.0)
Hemoglobin: 11.2 g/dL — ABNORMAL LOW (ref 12.0–15.0)
MCH: 30.4 pg (ref 26.0–34.0)
MCHC: 32.9 g/dL (ref 30.0–36.0)
MCV: 92.1 fL (ref 80.0–100.0)
Platelets: 403 10*3/uL — ABNORMAL HIGH (ref 150–400)
RBC: 3.69 MIL/uL — ABNORMAL LOW (ref 3.87–5.11)
RDW: 12.6 % (ref 11.5–15.5)
WBC: 17.5 10*3/uL — ABNORMAL HIGH (ref 4.0–10.5)
nRBC: 0 % (ref 0.0–0.2)

## 2021-08-30 LAB — GLUCOSE, CAPILLARY
Glucose-Capillary: 226 mg/dL — ABNORMAL HIGH (ref 70–99)
Glucose-Capillary: 240 mg/dL — ABNORMAL HIGH (ref 70–99)
Glucose-Capillary: 247 mg/dL — ABNORMAL HIGH (ref 70–99)
Glucose-Capillary: 352 mg/dL — ABNORMAL HIGH (ref 70–99)
Glucose-Capillary: 252 mg/dL — ABNORMAL HIGH (ref 70–99)

## 2021-08-30 LAB — BASIC METABOLIC PANEL
Anion gap: 8 (ref 5–15)
BUN: 40 mg/dL — ABNORMAL HIGH (ref 6–20)
CO2: 21 mmol/L — ABNORMAL LOW (ref 22–32)
Calcium: 8.1 mg/dL — ABNORMAL LOW (ref 8.9–10.3)
Chloride: 105 mmol/L (ref 98–111)
Creatinine, Ser: 2.27 mg/dL — ABNORMAL HIGH (ref 0.44–1.00)
GFR, Estimated: 27 mL/min — ABNORMAL LOW (ref >60)
Glucose, Bld: 279 mg/dL — ABNORMAL HIGH (ref 70–99)
Potassium: 3.9 mmol/L (ref 3.5–5.1)
Sodium: 134 mmol/L — ABNORMAL LOW (ref 135–145)

## 2021-08-30 LAB — MAGNESIUM: Magnesium: 2 mg/dL (ref 1.7–2.4)

## 2021-08-30 SURGERY — IRRIGATION AND DEBRIDEMENT EXTREMITY
Anesthesia: General | Site: Foot | Laterality: Left

## 2021-08-30 MED ORDER — LIDOCAINE 2% (20 MG/ML) 5 ML SYRINGE
INTRAMUSCULAR | Status: DC | PRN
  Administered 2021-08-30: 30 mg via INTRAVENOUS

## 2021-08-30 MED ORDER — MIDAZOLAM HCL 2 MG/2ML IJ SOLN
INTRAMUSCULAR | Status: AC
  Filled 2021-08-30: qty 2

## 2021-08-30 MED ORDER — SODIUM CHLORIDE 0.45 % IV SOLN: INTRAVENOUS | Status: DC

## 2021-08-30 MED ORDER — PROPOFOL 10 MG/ML IV BOLUS
INTRAVENOUS | Status: AC
  Filled 2021-08-30: qty 20

## 2021-08-30 MED ORDER — ACETAMINOPHEN 500 MG PO TABS
1000.0000 mg | ORAL_TABLET | Freq: Once | ORAL | Status: AC
  Administered 2021-08-30: 1000 mg via ORAL
  Filled 2021-08-30: qty 2

## 2021-08-30 MED ORDER — ONDANSETRON HCL 4 MG/2ML IJ SOLN
INTRAMUSCULAR | Status: DC | PRN
  Administered 2021-08-30: 4 mg via INTRAVENOUS

## 2021-08-30 MED ORDER — FENTANYL CITRATE (PF) 250 MCG/5ML IJ SOLN
INTRAMUSCULAR | Status: DC | PRN
  Administered 2021-08-30: 50 ug via INTRAVENOUS

## 2021-08-30 MED ORDER — MIDAZOLAM HCL 2 MG/2ML IJ SOLN
INTRAMUSCULAR | Status: DC | PRN
  Administered 2021-08-30: 2 mg via INTRAVENOUS

## 2021-08-30 MED ORDER — DIPHENHYDRAMINE HCL 25 MG PO CAPS
25.0000 mg | ORAL_CAPSULE | Freq: Four times a day (QID) | ORAL | Status: DC | PRN
  Administered 2021-08-30 – 2021-09-01 (×5): 25 mg via ORAL
  Filled 2021-08-30 (×6): qty 1

## 2021-08-30 MED ORDER — FENTANYL CITRATE (PF) 100 MCG/2ML IJ SOLN: 25.0000 ug | INTRAMUSCULAR | Status: DC | PRN

## 2021-08-30 MED ORDER — HEPARIN SODIUM (PORCINE) 5000 UNIT/ML IJ SOLN
5000.0000 [IU] | Freq: Three times a day (TID) | INTRAMUSCULAR | Status: DC
  Administered 2021-08-31 – 2021-09-05 (×10): 5000 [IU] via SUBCUTANEOUS
  Filled 2021-08-30 (×12): qty 1

## 2021-08-30 MED ORDER — INSULIN GLARGINE-YFGN 100 UNIT/ML ~~LOC~~ SOLN
18.0000 [IU] | Freq: Every day | SUBCUTANEOUS | Status: DC
  Administered 2021-08-31 – 2021-09-05 (×6): 18 [IU] via SUBCUTANEOUS
  Filled 2021-08-30 (×6): qty 0.18

## 2021-08-30 MED ORDER — INSULIN GLARGINE-YFGN 100 UNIT/ML ~~LOC~~ SOLN
28.0000 [IU] | Freq: Every day | SUBCUTANEOUS | Status: DC
  Administered 2021-08-30 – 2021-09-04 (×6): 28 [IU] via SUBCUTANEOUS
  Filled 2021-08-30 (×9): qty 0.28

## 2021-08-30 MED ORDER — AMISULPRIDE (ANTIEMETIC) 5 MG/2ML IV SOLN: 10.0000 mg | Freq: Once | INTRAVENOUS | Status: DC | PRN

## 2021-08-30 MED ORDER — FUROSEMIDE 20 MG PO TABS: 20.0000 mg | ORAL_TABLET | Freq: Every day | ORAL | Status: DC

## 2021-08-30 MED ORDER — DIPHENHYDRAMINE HCL 50 MG/ML IJ SOLN
25.0000 mg | Freq: Once | INTRAMUSCULAR | Status: AC
  Administered 2021-08-30: 25 mg via INTRAVENOUS
  Filled 2021-08-30: qty 1

## 2021-08-30 MED ORDER — SODIUM CHLORIDE 0.9 % IV SOLN: INTRAVENOUS | Status: DC

## 2021-08-30 MED ORDER — LIDOCAINE HCL 1 % IJ SOLN
INTRAMUSCULAR | Status: AC
  Filled 2021-08-30: qty 20

## 2021-08-30 MED ORDER — BUPIVACAINE HCL (PF) 0.25 % IJ SOLN
INTRAMUSCULAR | Status: AC
  Filled 2021-08-30: qty 20

## 2021-08-30 MED ORDER — LIDOCAINE HCL 1 % IJ SOLN
INTRAMUSCULAR | Status: DC | PRN
  Administered 2021-08-30: 10 mL via INTRAMUSCULAR

## 2021-08-30 MED ORDER — FENTANYL CITRATE (PF) 250 MCG/5ML IJ SOLN
INTRAMUSCULAR | Status: AC
  Filled 2021-08-30: qty 5

## 2021-08-30 MED ORDER — INSULIN ASPART 100 UNIT/ML IJ SOLN
0.0000 [IU] | INTRAMUSCULAR | Status: DC | PRN
  Administered 2021-08-30: 3 [IU] via SUBCUTANEOUS

## 2021-08-30 MED ORDER — CHLORHEXIDINE GLUCONATE 0.12 % MT SOLN
15.0000 mL | Freq: Once | OROMUCOSAL | Status: AC
  Administered 2021-08-30: 15 mL via OROMUCOSAL
  Filled 2021-08-30: qty 15

## 2021-08-30 MED ORDER — ORAL CARE MOUTH RINSE: 15.0000 mL | Freq: Once | OROMUCOSAL | Status: AC

## 2021-08-30 MED ORDER — PROPOFOL 10 MG/ML IV BOLUS
INTRAVENOUS | Status: DC | PRN
Start: 2021-08-30 — End: 2021-08-30
  Administered 2021-08-30: 170 mg via INTRAVENOUS

## 2021-08-30 SURGICAL SUPPLY — 46 items
APL PRP STRL LF DISP 70% ISPRP (MISCELLANEOUS) ×1
BAG COUNTER SPONGE SURGICOUNT (BAG) ×3 IMPLANT
BAG SPNG CNTER NS LX DISP (BAG) ×1
BLADE AVERAGE 25X9 (BLADE) ×3 IMPLANT
BNDG CMPR 9X4 STRL LF SNTH (GAUZE/BANDAGES/DRESSINGS)
BNDG ELASTIC 4X5.8 VLCR STR LF (GAUZE/BANDAGES/DRESSINGS) ×1 IMPLANT
BNDG ESMARK 4X9 LF (GAUZE/BANDAGES/DRESSINGS) IMPLANT
BNDG GAUZE ELAST 4 BULKY (GAUZE/BANDAGES/DRESSINGS) ×1 IMPLANT
CHLORAPREP W/TINT 26 (MISCELLANEOUS) ×3 IMPLANT
COVER SURGICAL LIGHT HANDLE (MISCELLANEOUS) ×3 IMPLANT
CUFF TOURN SGL QUICK 18X4 (TOURNIQUET CUFF) ×1 IMPLANT
CUFF TOURN SGL QUICK 34 (TOURNIQUET CUFF)
CUFF TRNQT CYL 34X4.125X (TOURNIQUET CUFF) IMPLANT
DRAPE U-SHAPE 47X51 STRL (DRAPES) ×3 IMPLANT
ELECT CAUTERY BLADE 6.4 (BLADE) ×3 IMPLANT
ELECT REM PT RETURN 9FT ADLT (ELECTROSURGICAL) ×2 IMPLANT
ELECTRODE REM PT RTRN 9FT ADLT (ELECTROSURGICAL) ×2 IMPLANT
GAUZE PACKING IODOFORM 1/4X15 (PACKING) ×1 IMPLANT
GAUZE SPONGE 4X4 12PLY STRL (GAUZE/BANDAGES/DRESSINGS) ×1 IMPLANT
GLOVE BIO SURGEON STRL SZ7.5 (GLOVE) ×3 IMPLANT
GLOVE BIOGEL PI IND STRL 8 (GLOVE) ×2 IMPLANT
GLOVE BIOGEL PI INDICATOR 8 (GLOVE) ×1
GLOVE SURG UNDER POLY LF SZ7.5 (GLOVE) ×3 IMPLANT
GOWN STRL REUS W/ TWL LRG LVL3 (GOWN DISPOSABLE) ×4 IMPLANT
GOWN STRL REUS W/TWL LRG LVL3 (GOWN DISPOSABLE) ×4
HANDPIECE INTERPULSE COAX TIP (DISPOSABLE) ×2
KIT BASIN OR (CUSTOM PROCEDURE TRAY) ×3 IMPLANT
KIT TURNOVER KIT B (KITS) ×3 IMPLANT
MANIFOLD NEPTUNE II (INSTRUMENTS) ×3 IMPLANT
NDL BIOPSY JAMSHIDI 8X6 (NEEDLE) IMPLANT
NDL HYPO 25GX1X1/2 BEV (NEEDLE) IMPLANT
NEEDLE BIOPSY JAMSHIDI 8X6 (NEEDLE) ×2 IMPLANT
NEEDLE HYPO 25GX1X1/2 BEV (NEEDLE) ×2 IMPLANT
NS IRRIG 1000ML POUR BTL (IV SOLUTION) ×3 IMPLANT
PACK ORTHO EXTREMITY (CUSTOM PROCEDURE TRAY) ×3 IMPLANT
PAD ARMBOARD 7.5X6 YLW CONV (MISCELLANEOUS) ×6 IMPLANT
PROBE DEBRIDE SONICVAC MISONIX (TIP) IMPLANT
SET CYSTO W/LG BORE CLAMP LF (SET/KITS/TRAYS/PACK) ×2 IMPLANT
SET HNDPC FAN SPRY TIP SCT (DISPOSABLE) ×2 IMPLANT
SOL PREP POV-IOD 4OZ 10% (MISCELLANEOUS) ×6 IMPLANT
SUT ETHILON 3 0 FSL (SUTURE) ×6 IMPLANT
SYR CONTROL 10ML LL (SYRINGE) ×1 IMPLANT
TOWEL GREEN STERILE (TOWEL DISPOSABLE) ×3 IMPLANT
TOWEL GREEN STERILE FF (TOWEL DISPOSABLE) ×3 IMPLANT
TUBE CONNECTING 12X1/4 (SUCTIONS) ×3 IMPLANT
YANKAUER SUCT BULB TIP NO VENT (SUCTIONS) ×3 IMPLANT

## 2021-08-30 NOTE — Progress Notes (Signed)
?   08/30/21 1651  ?Assess: MEWS Score  ?Temp 99.9 ?F (37.7 ?C)  ?BP (!) 145/83  ?Pulse Rate (!) 114  ?Resp 20  ?SpO2 92 %  ?Assess: MEWS Score  ?MEWS Temp 0  ?MEWS Systolic 0  ?MEWS Pulse 2  ?MEWS RR 0  ?MEWS LOC 0  ?MEWS Score 2  ?MEWS Score Color Yellow  ?Assess: if the MEWS score is Yellow or Red  ?Were vital signs taken at a resting state? Yes  ?Focused Assessment No change from prior assessment  ?Early Detection of Sepsis Score *See Row Information* Medium  ?MEWS guidelines implemented *See Row Information* Yes  ?Treat  ?MEWS Interventions Administered prn meds/treatments  ?Pain Scale 0-10  ?Pain Score 10  ?Pain Type Surgical pain  ?Pain Location Foot  ?Pain Orientation Left  ?Pain Descriptors / Indicators Aching;Throbbing  ?Pain Frequency Constant  ?Pain Intervention(s) Medication (See eMAR)  ?Take Vital Signs  ?Increase Vital Sign Frequency  Yellow: Q 2hr X 2 then Q 4hr X 2, if remains yellow, continue Q 4hrs  ?Escalate  ?MEWS: Escalate Yellow: discuss with charge nurse/RN and consider discussing with provider and RRT  ?Notify: Charge Nurse/RN  ?Name of Charge Nurse/RN Notified Gwenlyn Perking, RN  ?Date Charge Nurse/RN Notified 08/30/21  ?Time Charge Nurse/RN Notified 1714  ?Notify: Provider  ?Provider Name/Title Bonnielee Haff  ?Date Provider Notified 08/30/21  ?Time Provider Notified 1715  ?Notification Type Page  ?Notification Reason Other (Comment) ?(HR)  ?Provider response No new orders  ?Date of Provider Response 08/30/21  ?Time of Provider Response 1716  ?Document  ?Patient Outcome Other (Comment) ?(Will continue to monitor)  ?Progress note created (see row info) Yes  ? ? ?

## 2021-08-30 NOTE — Plan of Care (Signed)
  Problem: Education: Goal: Knowledge of General Education information will improve Description: Including pain rating scale, medication(s)/side effects and non-pharmacologic comfort measures Outcome: Progressing   Problem: Safety: Goal: Ability to remain free from injury will improve Outcome: Progressing   

## 2021-08-30 NOTE — Anesthesia Preprocedure Evaluation (Signed)
Anesthesia Evaluation  ?Patient identified by MRN, date of birth, ID band ?Patient awake ? ? ? ?Reviewed: ?Allergy & Precautions, NPO status , Patient's Chart, lab work & pertinent test results ? ?Airway ?Mallampati: II ? ?TM Distance: >3 FB ?Neck ROM: Full ? ? ? Dental ?  ?Pulmonary ?former smoker,  ?  ?breath sounds clear to auscultation ? ? ? ? ? ? Cardiovascular ?hypertension, Pt. on medications and Pt. on home beta blockers ?+ CAD and +CHF  ? ?Rhythm:Regular Rate:Normal ? ? ?  ?Neuro/Psych ?CVA   ? GI/Hepatic ?negative GI ROS, Neg liver ROS,   ?Endo/Other  ?diabetes, Type 2, Insulin DependentHypothyroidism  ? Renal/GU ?CRFRenal disease  ? ?  ?Musculoskeletal ? ? Abdominal ?  ?Peds ? Hematology ?  ?Anesthesia Other Findings ? ? Reproductive/Obstetrics ? ?  ? ? ? ? ? ? ? ? ? ? ? ? ? ?  ?  ? ? ? ? ? ? ? ? ?Anesthesia Physical ?Anesthesia Plan ? ?ASA: 3 ? ?Anesthesia Plan: General  ? ?Post-op Pain Management: Tylenol PO (pre-op)*  ? ?Induction: Intravenous ? ?PONV Risk Score and Plan: 3 and Dexamethasone, Ondansetron, Midazolam and Treatment may vary due to age or medical condition ? ?Airway Management Planned: LMA ? ?Additional Equipment: None ? ?Intra-op Plan:  ? ?Post-operative Plan: Extubation in OR ? ?Informed Consent: I have reviewed the patients History and Physical, chart, labs and discussed the procedure including the risks, benefits and alternatives for the proposed anesthesia with the patient or authorized representative who has indicated his/her understanding and acceptance.  ? ? ? ?Dental advisory given ? ?Plan Discussed with: CRNA ? ?Anesthesia Plan Comments:   ? ? ? ? ? ? ?Anesthesia Quick Evaluation ? ?

## 2021-08-30 NOTE — Op Note (Signed)
Surgeon: Surgeon(s): ?[name], DPM  ?Assistants: None ?Pre-operative diagnosis: LEFT FOOD WOUND  ?Post-operative diagnosis: same ?Procedure: Procedure(s) (LRB): ?IRRIGATION AND DEBRIDEMENT WITH BONE BIOPSY (Left)  ?Pathology:  ?ID Type Source Tests Collected by Time Destination  ?A : Left foot wound  Wound Wound AEROBIC/ANAEROBIC CULTURE W GRAM STAIN (SURGICAL/DEEP WOUND) [name], [place] 08/30/2021 [phone]   ?B : Left Foot Wound Clean Wound Wound AEROBIC/ANAEROBIC CULTURE W GRAM STAIN (SURGICAL/DEEP WOUND) [name], DPM 08/30/2021 0805   ?  ?Pertinent Intra-op findings: Significant for around the fifth metatarsal.  The fifth metatarsal head was hard and indurated.  Multiple samples were obtained.  Moderate bleeding noted ?Anesthesia: General  ?Hemostasis: * No tourniquets in log * ?EBL: 50 cc ?Materials: Iodoform packing ?Injectables: 10 cc of one-to-one mixture of half percent Marcaine plain 1% lidocaine plain ?Complications: None ? ?Indications for surgery: ?A 41 y.o. female presents with left foot abscess concern for osteomyelitis. Patient has failed all conservative therapy including but not limited to local wound care and IV antibiotics. She wishes to have surgical correction of the foot/deformity. It was determined that patient would benefit from left foot incision drainage washout debridement with bone biopsy. ?Informed surgical risk consent was reviewed and read aloud to the patient.  I reviewed the films.  I have discussed my findings with the patient in great detail.  I have discussed all risks including but not limited to infection, stiffness, scarring, limp, disability, deformity, damage to blood vessels and nerves, numbness, poor healing, need for braces, arthritis, chronic pain, amputation, death.  All benefits and realistic expectations discussed in great detail.  I have made no promises as to the outcome.  I have provided realistic expectations.  I have offered the patient a 2nd opinion,  which they have declined and assured me they preferred to proceed despite the risks ? ? ?Procedure in detail: ?The patient was both verbally and visually identified by myself, the nursing staff, and anesthesia staff in the preoperative holding area. They were then transferred to the operating room and placed on the operative table in supine position. ? ?Attention was directed to the left lateral foot, a skin marker was used to make a longitudinal incision.  A #15 blade was used to make the incision from epidermal dermal junction down to the level of the bone.  At this time is important on that decompressed.  Using hemostat all tissue planes were thoroughly decompressed.  A culture was obtained that was labeled dirty left foot.  This was sent to microbiology in standard technique.  At this time all tissue planes were obtained and all of purulent drainage was decompressed.  Using pulse lavage and 3 L saline solution the incision/wound was thoroughly irrigated.  At this time clean cultures were taken and sent to microbiology.  Using clean instrumentation all tissue planes were again decompressed.  No further further purulent drainage was noted.  Using Jamshidi bone biopsy of the fifth metatarsal was obtained in standard technique and sent to microbiology and pathology and sterile technique.  At this time it was determined that patient will benefit from leaving the wound open when to allow the infection to drain appropriately.  The wound was packed with iodoform packing and dressed with Kerlix Ace bandage or gauze.  All bony prominences were adequately padded.  This is a staged procedure patient will likely need delayed primary closure. ? ?Disposition ?We will continue to clinically monitor the improvement of redness.  Patient may need to  return to the operating room for primary closure.  Continue doing dressing change daily with iodoform packing.  Partial weightbearing to the heel.  Patient is a high risk of losing the  fifth ray ? ?At the conclusion of the procedure the patient was awoken from anesthesia and found to have tolerated the procedure well any complications. There were transferred to PACU with vital signs stable and vascular status intact. ? ?Boneta Lucks, DPM ? ?

## 2021-08-30 NOTE — Transfer of Care (Signed)
Immediate Anesthesia Transfer of Care Note ? ?Patient: Lauren [name] ? ?Procedure(s) Performed: IRRIGATION AND DEBRIDEMENT WITH BONE BIOPSY (Left: Foot) ? ?Patient Location: PACU ? ?Anesthesia Type:General ? ?Level of Consciousness: awake and alert  ? ?Airway & Oxygen Therapy: Patient Spontanous Breathing and Patient connected to nasal cannula oxygen ? ?Post-op Assessment: Report given to RN and Post -op Vital signs reviewed and stable ? ?Post vital signs: Reviewed and stable ? ?Last Vitals:  ?Vitals Value Taken Time  ?BP 106/68 08/30/21 0828  ?Temp    ?Pulse 86 08/30/21 0833  ?Resp 17 08/30/21 0833  ?SpO2 95 % 08/30/21 0833  ?Vitals shown include unvalidated device data. ? ?Last Pain:  ?Vitals:  ? 08/30/21 0640  ?TempSrc: Oral  ?PainSc:   ?   ? ?Patients Stated Pain Goal: 0 (08/29/21 1843) ? ?Complications: No notable events documented. ?

## 2021-08-30 NOTE — Interval H&P Note (Signed)
History and Physical Interval Note: ? ?08/30/2021 ?7:33 AM ? ?Lauren [name]  has presented today for surgery, with the diagnosis of LEFT FOOD WOUND.  The various methods of treatment have been discussed with the patient and family. After consideration of risks, benefits and other options for treatment, the patient has consented to  Procedure(s): ?IRRIGATION AND DEBRIDEMENT WITH BONE BIOPSY (Left) as a surgical intervention.  The patient's history has been reviewed, patient examined, no change in status, stable for surgery.  I have reviewed the patient's chart and labs.  Questions were answered to the patient's satisfaction.   ? ? ?[name] ? ? ?

## 2021-08-30 NOTE — Anesthesia Postprocedure Evaluation (Signed)
Anesthesia Post Note ? ?Patient: Lauren [name] ? ?Procedure(s) Performed: IRRIGATION AND DEBRIDEMENT WITH BONE BIOPSY (Left: Foot) ? ?  ? ?Patient location during evaluation: PACU ?Anesthesia Type: General ?Level of consciousness: awake and alert ?Pain management: pain level controlled ?Vital Signs Assessment: post-procedure vital signs reviewed and stable ?Respiratory status: spontaneous breathing, nonlabored ventilation, respiratory function stable and patient connected to nasal cannula oxygen ?Cardiovascular status: blood pressure returned to baseline and stable ?Postop Assessment: no apparent nausea or vomiting ?Anesthetic complications: no ? ? ?No notable events documented. ? ?Last Vitals:  ?Vitals:  ? 08/30/21 1651 08/30/21 2000  ?BP: (!) 145/83 131/73  ?Pulse: (!) 114 (!) 104  ?Resp: 20 18  ?Temp: 37.7 ?C 37 ?C  ?SpO2: 92% 95%  ?  ?Last Pain:  ?Vitals:  ? 08/30/21 2233  ?TempSrc:   ?PainSc: 1   ? ? ?  ?  ?  ?  ?  ?  ? ?Suzette Battiest E ? ? ? ? ?

## 2021-08-30 NOTE — Progress Notes (Signed)
? ?[facility] ?PROGRESS NOTE ? ? ?Lauren [name] TXH:[id number] DOB: 10/13/1980 DOA: 08/27/2021 ? ?PCP: Lemmie Evens, MD ? ?Brief History/Interval Summary: 41 y.o. female with medical history significant of DM1, HTN, chronic systolic CHF, hypothyroidism, CKD stage IIIa, HLD, class II obesity, CAD, history of CVA presented with progressive worsening of the redness in her left foot.  Pain also worsened over several days prior to admission.  Patient was found to have evidence for cellulitis.  She was noted to have an ulcer with concern for osteomyelitis.  She was hospitalized for further management.  Placed on IV antibiotics. ? ?Consultants: Podiatry ? ?Procedures:  ?ABIs. ? ?Irrigation and debridement left foot with bone biopsy ? ? ? ?Subjective/Interval History: ?Patient seen after she came back from the OR.  Has 7 out of 10 pain in the left foot area.  Denies any shortness of breath.  No nausea.  No chest pain.   ? ? ?Assessment/Plan: ? ?Cellulitis of the left foot with diabetic ulcer ?No clear evidence for sepsis at the time of admission. ?Patient currently on broad-spectrum antibiotics with vancomycin cefepime and metronidazole. ?Plain films did not show any osseous abnormality.  MRI of the foot raises concern for abscess and osteomyelitis involving the fifth metatarsal head. ?Patient seen by podiatry. ABIs showed mild right lower extremity arterial disease.  The left ABI was within normal range. ?Taken to the OR this morning where she underwent irrigation and debridement of the left foot.  Bone biopsy was done.  Apparently the wound had to be left open.  Patient to be taken back to the OR in a few days. ?Follow-up on cultures.  Noted to be afebrile this morning. ?WBC remains elevated at 17.5. ?ESR noted to be elevated at 80.  CRP 19.3.  Procalcitonin 0.55.  Lactic acid level was normal. ? 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  On Lantus insulin at home at 40 units twice  a day.   ?On low-dose of insulin here due to hypoglycemic episodes.  CBGs noted to be elevated.  We will increase the dose of her insulin going forward.    ? ?Essential hypertension ?Blood pressure reasonably well controlled.  Patient on metoprolol and furosemide. ? ?Chronic kidney disease stage IIIb ?Baseline creatinine is between 2.2-3.0.  Avoid nephrotoxic agents.  Monitor renal functions closely.  Monitor urine output. ? ?Hypothyroidism ?TSH noted to be quite elevated at 25.  Mentions that she has been compliant with her thyroid medications.  Dose of levothyroxine was increased.  TSH to be rechecked in a few weeks. ? ?Hyperlipidemia ?Continue statin and fenofibrate. ? ?History of stroke ?Plavix is currently on hold.  Continue statin.  . ? ?Coronary artery disease ?Cardiac status seems to be stable.  Continue statin.  Home medication list reviewed.  Plavix currently on hold.  Continue metoprolol for ? ?Chronic combined systolic and diastolic CHF ?Last echocardiogram report is from January 2022 which revealed EF of 35 to 40% with grade 3 diastolic dysfunction.  Seems to be reasonably well compensated.  Continue furosemide.  Vania Rea is on hold.  Continue metoprolol.  No ACE inhibitor or ARB or Entresto due to elevated creatinine. ?Strict ins and outs and daily weights. ? ?Hypokalemia ?Potassium level has improved.  Recheck labs tomorrow.  Magnesium 2.0  ? ?Obesity ?Estimated body mass index is 34.63 kg/m? as calculated from the following: ?  Height as of this encounter: 5' 5"  (1.651 m). ?  Weight as of this encounter: 94.4 kg. ? ?  DVT Prophylaxis: Reinitiate subcutaneous heparin ?Code Status: Full code ?Family Communication: Discussed with patient ?Disposition Plan: Hopefully return home when improved ? ?Status is: Inpatient ?Remains inpatient appropriate because: Left foot cellulitis with concern for osteomyelitis.  Requiring IV antibiotics. ? ? ? ? ? ?Medications: Scheduled: ? atorvastatin  40 mg Oral Daily  ?  fenofibrate  160 mg Oral Daily  ? furosemide  40 mg Oral Daily  ? insulin aspart  0-15 Units Subcutaneous TID WC  ? insulin aspart  0-5 Units Subcutaneous QHS  ? insulin glargine-yfgn  15 Units Subcutaneous Daily  ? insulin glargine-yfgn  25 Units Subcutaneous QHS  ? levothyroxine  200 mcg Oral Q0600  ? metoprolol succinate  25 mg Oral Daily  ? multivitamin with minerals  1 tablet Oral Daily  ? nutrition supplement (JUVEN)  1 packet Oral BID BM  ? pantoprazole  40 mg Oral Daily  ? ?Continuous: ? sodium chloride 75 mL/hr at 08/30/21 1012  ? ceFEPime (MAXIPIME) IV 2 g (08/30/21 0551)  ? metronidazole 500 mg (08/30/21 1105)  ? vancomycin 1,500 mg (08/29/21 2151)  ? ?IYM:EBRAXENMMHWKG **OR** acetaminophen, fentaNYL (SUBLIMAZE) injection, HYDROcodone-acetaminophen, ondansetron (ZOFRAN) IV ? ?Antibiotics: ?Anti-infectives (From admission, onward)  ? ? Start     Dose/Rate Route Frequency Ordered Stop  ? 08/29/21 2200  vancomycin (VANCOREADY) IVPB 1500 mg/300 mL       ? 1,500 mg ?150 mL/hr over 120 Minutes Intravenous Every 48 hours 08/27/21 2320 09/08/21 2159  ? 08/27/21 2330  ceFEPIme (MAXIPIME) 2 g in sodium chloride 0.9 % 100 mL IVPB       ? 2 g ?200 mL/hr over 30 Minutes Intravenous Every 12 hours 08/27/21 2320 09/03/21 1259  ? 08/27/21 2330  vancomycin (VANCOREADY) IVPB 2000 mg/400 mL       ? 2,000 mg ?200 mL/hr over 120 Minutes Intravenous  Once 08/27/21 2320 08/28/21 0349  ? 08/27/21 2315  metroNIDAZOLE (FLAGYL) IVPB 500 mg       ? 500 mg ?100 mL/hr over 60 Minutes Intravenous Every 12 hours 08/27/21 2305 09/03/21 2314  ? 08/27/21 2200  vancomycin (VANCOCIN) IVPB 1000 mg/200 mL premix  Status:  Discontinued       ? 1,000 mg ?200 mL/hr over 60 Minutes Intravenous  Once 08/27/21 2152 08/27/21 2320  ? 08/27/21 2200  piperacillin-tazobactam (ZOSYN) IVPB 3.375 g  Status:  Discontinued       ? 3.375 g ?100 mL/hr over 30 Minutes Intravenous  Once 08/27/21 2152 08/27/21 2322  ? ?  ? ? ?Objective: ? ?Vital Signs ? ?Vitals:   ? 08/30/21 8811 08/30/21 0315 08/30/21 9458 08/30/21 0936  ?BP: 106/68 109/67 109/68 106/68  ?Pulse: 89 83 85 81  ?Resp: (!) 22 14 17 17   ?Temp: (!) 97.2 ?F (36.2 ?C)  97.8 ?F (36.6 ?C) 98.6 ?F (37 ?C)  ?TempSrc:    Oral  ?SpO2: 90% 96% 94% 95%  ?Weight:      ?Height:      ? ? ?Intake/Output Summary (Last 24 hours) at 08/30/2021 1115 ?Last data filed at 08/30/2021 5929 ?Gross per 24 hour  ?Intake 496.49 ml  ?Output 0 ml  ?Net 496.49 ml  ? ? ?Filed Weights  ? 08/27/21 1957 08/28/21 0127 08/30/21 0717  ?Weight: 97.5 kg 94.4 kg 94.4 kg  ? ? ?General appearance: Awake alert.  In no distress ?Resp: Clear to auscultation bilaterally.  Normal effort ?Cardio: S1-S2 is normal regular.  No S3-S4.  No rubs murmurs or bruit ?GI: Abdomen is soft.  Nontender nondistended.  Bowel sounds are present normal.  No masses organomegaly ?Extremities: Left foot covered in dressing ?Neurologic: Alert and oriented x3.  No focal neurological deficits.  ? ? ? ?Lab Results: ? ?Data Reviewed: I have personally reviewed following labs and reports of the imaging studies ? ?CBC: ?Recent Labs  ?Lab 08/27/21 ?2018 08/28/21 ?1856 08/29/21 ?0256 08/30/21 ?0132  ?WBC 16.8* 16.9* 17.2* 17.5*  ?HGB 13.3 11.9* 11.6* 11.2*  ?HCT 38.9 36.8 35.5* 34.0*  ?MCV 90.0 92.0 90.8 92.1  ?PLT 369 385 402* 403*  ? ? ? ?Basic Metabolic Panel: ?Recent Labs  ?Lab 08/27/21 ?2018 08/28/21 ?0001 08/28/21 ?3149 08/29/21 ?0256 08/30/21 ?0132  ?NA 134*  --  135 135 134*  ?K 3.3*  --  3.4* 3.4* 3.9  ?CL 101  --  106 105 105  ?CO2 23  --  20* 23 21*  ?GLUCOSE 354*  --  69* 214* 279*  ?BUN 29*  --  26* 25* 40*  ?CREATININE 2.33*  --  2.19* 2.21* 2.27*  ?CALCIUM 7.8*  --  7.5* 7.9* 8.1*  ?MG  --  2.0  --   --  2.0  ?PHOS  --  4.2  --   --   --   ? ? ? ?GFR: ?Estimated Creatinine Clearance: 37.8 mL/min (A) (by C-G formula based on SCr of 2.27 mg/dL (H)). ? ?Liver Function Tests: ?Recent Labs  ?Lab 08/28/21 ?0001 08/28/21 ?7026  ?AST 13* 11*  ?ALT 15 13  ?ALKPHOS 134* 119  ?BILITOT  0.6 0.7  ?PROT 6.0* 5.7*  ?ALBUMIN 2.0* 2.0*  ? ? ? ? ?Coagulation Profile: ?Recent Labs  ?Lab 08/28/21 ?0001  ?INR 1.1  ? ? ? ?Cardiac Enzymes: ?Recent Labs  ?Lab 08/28/21 ?0001  ?CKTOTAL 54  ? ? ? ? ?

## 2021-08-30 NOTE — Anesthesia Procedure Notes (Signed)
Procedure Name: LMA Insertion ?Date/Time: 08/30/2021 7:45 AM ?Performed by: Eligha Bridegroom, CRNA ?Pre-anesthesia Checklist: Patient identified, Emergency Drugs available, Suction available, Patient being monitored and Timeout performed ?Patient Re-evaluated:Patient Re-evaluated prior to induction ?Oxygen Delivery Method: Circle system utilized ?Preoxygenation: Pre-oxygenation with 100% oxygen ?Induction Type: IV induction ?LMA: LMA inserted ?LMA Size: 4.0 ?Number of attempts: 1 ?Placement Confirmation: positive ETCO2 and breath sounds checked- equal and bilateral ?Tube secured with: Tape ? ? ? ? ?

## 2021-08-30 NOTE — Progress Notes (Signed)
Pt left the floor for surgery. ?

## 2021-08-31 ENCOUNTER — Encounter (HOSPITAL_COMMUNITY): Payer: Self-pay | Admitting: Podiatry

## 2021-08-31 DIAGNOSIS — I10 Essential (primary) hypertension: Secondary | ICD-10-CM | POA: Diagnosis not present

## 2021-08-31 DIAGNOSIS — N1832 Chronic kidney disease, stage 3b: Secondary | ICD-10-CM | POA: Diagnosis not present

## 2021-08-31 DIAGNOSIS — L03116 Cellulitis of left lower limb: Secondary | ICD-10-CM | POA: Diagnosis not present

## 2021-08-31 DIAGNOSIS — E039 Hypothyroidism, unspecified: Secondary | ICD-10-CM | POA: Diagnosis not present

## 2021-08-31 LAB — BASIC METABOLIC PANEL
Anion gap: 8 (ref 5–15)
BUN: 37 mg/dL — ABNORMAL HIGH (ref 6–20)
CO2: 19 mmol/L — ABNORMAL LOW (ref 22–32)
Calcium: 7.8 mg/dL — ABNORMAL LOW (ref 8.9–10.3)
Chloride: 108 mmol/L (ref 98–111)
Creatinine, Ser: 2.43 mg/dL — ABNORMAL HIGH (ref 0.44–1.00)
GFR, Estimated: 25 mL/min — ABNORMAL LOW (ref >60)
Glucose, Bld: 123 mg/dL — ABNORMAL HIGH (ref 70–99)
Potassium: 3.9 mmol/L (ref 3.5–5.1)
Sodium: 135 mmol/L (ref 135–145)

## 2021-08-31 LAB — GLUCOSE, CAPILLARY
Glucose-Capillary: 127 mg/dL — ABNORMAL HIGH (ref 70–99)
Glucose-Capillary: 148 mg/dL — ABNORMAL HIGH (ref 70–99)
Glucose-Capillary: 173 mg/dL — ABNORMAL HIGH (ref 70–99)
Glucose-Capillary: 212 mg/dL — ABNORMAL HIGH (ref 70–99)
Glucose-Capillary: 263 mg/dL — ABNORMAL HIGH (ref 70–99)

## 2021-08-31 LAB — CBC
HCT: 34.2 % — ABNORMAL LOW (ref 36.0–46.0)
Hemoglobin: 11.4 g/dL — ABNORMAL LOW (ref 12.0–15.0)
MCH: 30.8 pg (ref 26.0–34.0)
MCHC: 33.3 g/dL (ref 30.0–36.0)
MCV: 92.4 fL (ref 80.0–100.0)
Platelets: 398 10*3/uL (ref 150–400)
RBC: 3.7 MIL/uL — ABNORMAL LOW (ref 3.87–5.11)
RDW: 12.7 % (ref 11.5–15.5)
WBC: 18.5 10*3/uL — ABNORMAL HIGH (ref 4.0–10.5)
nRBC: 0 % (ref 0.0–0.2)

## 2021-08-31 MED ORDER — FUROSEMIDE 20 MG PO TABS
20.0000 mg | ORAL_TABLET | Freq: Every day | ORAL | Status: DC
  Administered 2021-09-01: 20 mg via ORAL
  Filled 2021-08-31: qty 1

## 2021-08-31 MED ORDER — SODIUM CHLORIDE 0.9 % IV SOLN
1.0000 g | Freq: Two times a day (BID) | INTRAVENOUS | Status: DC
  Administered 2021-08-31 – 2021-09-01 (×2): 1 g via INTRAVENOUS
  Filled 2021-08-31 (×2): qty 20

## 2021-08-31 MED ORDER — SODIUM CHLORIDE 0.9 % IV SOLN
2.0000 g | Freq: Two times a day (BID) | INTRAVENOUS | Status: DC
  Administered 2021-08-31: 2 g via INTRAVENOUS
  Filled 2021-08-31 (×2): qty 40

## 2021-08-31 NOTE — Progress Notes (Signed)
Patient currently has 2 visitors at bedside.  I explained current visitation policy that only one visitor after 8 pm is allowed to stay.  She indicated that both family drove to hospital together.  Also, indicated that she worked at [employer] and they had more than one visitor in the room during the night on multiple occassions.  I referred her to the [facility] Web site that indicates the policy.  Patient was irritable during this encounter.  Other family members were calm and cooperative.  I explained that we would make an exception tonight due to the transportation issue but starting on 5/1, the visitation policy would be followed.  All in the room were in agreement.  Earleen Reaper RN ?

## 2021-08-31 NOTE — Progress Notes (Signed)
Pharmacy Antibiotic Note ? ?Lauren [name] is a 41 y.o. female admitted on 08/27/2021 with diabetic foot infection and possible osteo.  Pharmacy has been consulted for meropenem and vancomycin dosing. S/p I&D with bone biopsy on 4/30, wound left open. Will need to return to OR. ? ? ?Plan: ?Discontinue cefepime due to rash/hives ?Continue Vancomycin 1500 IV every 48 hours. (Goal AUC 400-550, Expected AUC: 540, SCr used: 2.33) ?Start Meropenem 2 grams IV every 12 hours ?Discontinue metronidazole ?Monitor clinical progress, cultures/sensitivities, renal function, abx plan ?Vancomycin levels as indicated ? ? ?Height: 5\' 5"  (165.1 cm) ?Weight: 94.4 kg (208 lb 1.8 oz) ?IBW/kg (Calculated) : 57 ? ?Temp (24hrs), Avg:98.9 ?F (37.2 ?C), Min:98.1 ?F (36.7 ?C), Max:99.9 ?F (37.7 ?C) ? ?Recent Labs  ?Lab 08/27/21 ?2018 08/28/21 ?3578 08/29/21 ?9784 08/30/21 ?0132 08/31/21 ?0402  ?WBC 16.8* 16.9* 17.2* 17.5* 18.5*  ?CREATININE 2.33* 2.19* 2.21* 2.27* 2.43*  ?LATICACIDVEN 1.1  --   --   --   --   ?  ?Estimated Creatinine Clearance: 35.3 mL/min (A) (by C-G formula based on SCr of 2.43 mg/dL (H)).   ? ?Allergies  ?Allergen Reactions  ? Wellbutrin [Bupropion] Hives  ? Ciprofloxacin Hcl Hives and Rash  ?  Hives/rash at injection site   ? Tape Rash  ? ? ?Antimicrobials this admission: ?4/27 Vancomycin >>(5/7) ?4/27 Cefepime >> 5/1 (possibly rash w/cefepime) ?4/27 Metronidazole >> 5/1 ?5/1 Meropenem >>  ? ?Dose adjustments this admission: ? ?Microbiology results: ?4/27 BCx: ng x 4d ?4/28 UCx: recollect  ?4/30 Wound Cx: rare GPC  ?4/30 Bone biopsy: pending ? ? ? ?Thank you for allowing [name] to participate in this patients care. ?Jens Som, PharmD ?08/31/2021 9:16 AM ? ?**Pharmacist phone directory can be found on [email or web address] listed under [place]** ? ? ?

## 2021-08-31 NOTE — Progress Notes (Signed)
Mobility Specialist: Progress Note ? ? 08/31/21 1733  ?Mobility  ?Activity Ambulated independently in hallway  ?Level of Assistance Contact guard assist, steadying assist  ?Assistive Device None  ?LLE Weight Bearing PWB ?(Through heel)  ?Distance Ambulated (ft) 400 ft  ?Activity Response Tolerated well  ?$Mobility charge 1 Mobility  ? ?Received pt in bed having no complaints and agreeable to mobility. Asymptomatic throughout ambulation, sitting EOB w/ call bell in reach and all needs met. ? ?Harrell Gave Jyair Kiraly ?Mobility Specialist ?Mobility Specialist [place]: [phone] ?Mobility Specialist [place]: [phone] ? ?

## 2021-08-31 NOTE — Progress Notes (Signed)
[facility] PROGRESS NOTE   Lanese Graeber VWU:[id number] DOB: [birth year] DOA: 08/27/2021  PCP: Gareth Morgan, MD  Brief History/Interval Summary: 41 y.o. female with medical history significant of DM1, HTN, chronic systolic CHF, hypothyroidism, CKD stage IIIa, HLD, class II obesity, CAD, history of CVA presented with progressive worsening of the redness in her left foot.  Pain also worsened over several days prior to admission.  Patient was found to have evidence for cellulitis.  She was noted to have an ulcer with concern for osteomyelitis.  She was hospitalized for further management.  Placed on IV antibiotics.  Consultants: Podiatry  Procedures:  ABIs.  Irrigation and debridement left foot with bone biopsy    Subjective/Interval History: Patient reports itching and a rash this morning.  Possibly due to cefepime.  Pain in the left foot is 5 out of 10 in intensity.  No chest pain shortness of breath.  Has been producing urine.     Assessment/Plan:  Cellulitis of the left foot with diabetic ulcer No clear evidence for sepsis at the time of admission. Patient currently on broad-spectrum antibiotics with vancomycin cefepime and metronidazole. Plain films did not show any osseous abnormality.  MRI of the foot raises concern for abscess and osteomyelitis involving the fifth metatarsal head. Patient seen by podiatry. ABIs showed mild right lower extremity arterial disease.  The left ABI was within normal range. Underwent irrigation and debridement of the left foot in the OR by podiatry on 4/30. Bone biopsy was done.  Apparently the wound had to be left open.  Patient to be taken back to the OR in a few days. Gram stain shows gram-positive cocci in pairs.  Follow-up on culture data. WBC remains elevated.  Has had episodes of fever during this hospital stay.ESR noted to be elevated at 80.  CRP 19.3.  Procalcitonin 0.55.  Lactic acid level was normal. Appears to have developed  some kind of allergic reaction to cefepime with rash and itching.  We will stop cefepime and changed to meropenem for now.  Continue to monitor closely.  Continue with vancomycin and metronidazole.  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  On Lantus insulin at home at 40 units twice a day.  Was initiated on low-dose of long-acting insulin due to hypoglycemia.  CBGs noted to be better.  Episodes of hyperglycemia noted in the evening hours.  Dose of insulin was increased.  We will see how she does over the next 24 hours.   Essential hypertension Blood pressure reasonably well controlled.  Patient on metoprolol and furosemide.  Mild to be held as discussed below.  Chronic kidney disease stage IIIb Baseline creatinine is between 2.2-3.0.  Avoid nephrotoxic agents.  Creatinine noted to be slightly higher today compared to yesterday.  We will hold furosemide for now.  Recheck labs tomorrow.  Metabolic acidosis noted.  Hypothyroidism TSH noted to be quite elevated at 25.  Mentions that she has been compliant with her thyroid medications.  Dose of levothyroxine was increased.  TSH to be rechecked in a few weeks.  Hyperlipidemia Continue statin and fenofibrate.  History of stroke Plavix is currently on hold.  Continue statin.   Coronary artery disease Cardiac status seems to be stable.  Continue statin.  Home medication list reviewed.  Plavix currently on hold.  Continue metoprolol.  Chronic combined systolic and diastolic CHF Last echocardiogram report is from January 2022 which revealed EF of 35 to 40% with grade 3 diastolic dysfunction.  Seems to be reasonably well compensated.  [place] Pepper is on hold.  Continue metoprolol.  No ACE inhibitor or ARB or Entresto due to elevated creatinine. Strict ins and outs and daily weights. Furosemide to be held due to climbing creatinine.  Recheck labs tomorrow  Hypokalemia Potassium level has improved.  Magnesium  2.0.  Obesity Estimated body mass index is 34.63 kg/m as calculated from the following:   Height as of this encounter: 5\' 5"  (1.651 m).   Weight as of this encounter: 94.4 kg.   DVT Prophylaxis: Subcutaneous heparin Code Status: Full code Family Communication: Discussed with patient Disposition Plan: Hopefully return home when improved  Status is: Inpatient Remains inpatient appropriate because: Left foot cellulitis with concern for osteomyelitis.  Requiring IV antibiotics.      Medications: Scheduled:  atorvastatin  40 mg Oral Daily   fenofibrate  160 mg Oral Daily   [START ON 09/01/2021] furosemide  20 mg Oral Daily   heparin injection (subcutaneous)  5,000 Units Subcutaneous Q8H   insulin aspart  0-15 Units Subcutaneous TID WC   insulin aspart  0-5 Units Subcutaneous QHS   insulin glargine-yfgn  18 Units Subcutaneous Daily   insulin glargine-yfgn  28 Units Subcutaneous QHS   levothyroxine  200 mcg Oral Q0600   metoprolol succinate  25 mg Oral Daily   multivitamin with minerals  1 tablet Oral Daily   nutrition supplement (JUVEN)  1 packet Oral BID BM   pantoprazole  40 mg Oral Daily   Continuous:  meropenem (MERREM) IV     vancomycin 1,500 mg (08/29/21 2151)   UJW:[id number] **OR** acetaminophen, diphenhydrAMINE, fentaNYL (SUBLIMAZE) injection, HYDROcodone-acetaminophen, ondansetron (ZOFRAN) IV  Antibiotics: Anti-infectives (From admission, onward)    Start     Dose/Rate Route Frequency Ordered Stop   08/31/21 1000  meropenem (MERREM) 2 g in sodium chloride 0.9 % 100 mL IVPB        2 g 280 mL/hr over 30 Minutes Intravenous Every 12 hours 08/31/21 0856     08/29/21 2200  vancomycin (VANCOREADY) IVPB 1500 mg/300 mL        1,500 mg 150 mL/hr over 120 Minutes Intravenous Every 48 hours 08/27/21 2320 09/08/21 2159   08/27/21 2330  ceFEPIme (MAXIPIME) 2 g in sodium chloride 0.9 % 100 mL IVPB  Status:  Discontinued        2 g 200 mL/hr over 30 Minutes Intravenous  Every 12 hours 08/27/21 2320 08/31/21 0841   08/27/21 2330  vancomycin (VANCOREADY) IVPB 2000 mg/400 mL        2,000 mg 200 mL/hr over 120 Minutes Intravenous  Once 08/27/21 2320 08/28/21 0349   08/27/21 2315  metroNIDAZOLE (FLAGYL) IVPB 500 mg  Status:  Discontinued        500 mg 100 mL/hr over 60 Minutes Intravenous Every 12 hours 08/27/21 2305 08/31/21 0904   08/27/21 2200  vancomycin (VANCOCIN) IVPB 1000 mg/200 mL premix  Status:  Discontinued        1,000 mg 200 mL/hr over 60 Minutes Intravenous  Once 08/27/21 2152 08/27/21 2320   08/27/21 2200  piperacillin-tazobactam (ZOSYN) IVPB 3.375 g  Status:  Discontinued        3.375 g 100 mL/hr over 30 Minutes Intravenous  Once 08/27/21 2152 08/27/21 2322       Objective:  Vital Signs  Vitals:   08/30/21 2000 08/31/21 0000 08/31/21 0400 08/31/21 0837  BP: 131/73 126/68 137/76 128/77  Pulse: (!) 104 89 97 80  Resp: 18 17 17  16  Temp: 98.6 F (37 C) 99.8 F (37.7 C) 98.1 F (36.7 C) 98.6 F (37 C)  TempSrc: Oral Oral Oral Oral  SpO2: 95% 98% 94% 95%  Weight:      Height:        Intake/Output Summary (Last 24 hours) at 08/31/2021 1036 Last data filed at 08/31/2021 0900 Gross per 24 hour  Intake 760 ml  Output 1225 ml  Net -465 ml    Filed Weights   08/27/21 1957 08/28/21 0127 08/30/21 0717  Weight: 97.5 kg 94.4 kg 94.4 kg    General appearance: Awake alert.  In no distress Resp: Clear to auscultation bilaterally.  Normal effort Cardio: S1-S2 is normal regular.  No S3-S4.  No rubs murmurs or bruit GI: Abdomen is soft.  Nontender nondistended.  Bowel sounds are present normal.  No masses organomegaly Extremities: Left foot covered in dressing.  Surrounding erythema seems to be slightly better. Neurologic: Alert and oriented x3.  No focal neurological deficits.      Lab Results:  Data Reviewed: I have personally reviewed following labs and reports of the imaging studies  CBC: Recent Labs  Lab 08/27/21 2018  08/28/21 0343 08/29/21 0256 08/30/21 0132 08/31/21 0402  WBC 16.8* 16.9* 17.2* 17.5* 18.5*  HGB 13.3 11.9* 11.6* 11.2* 11.4*  HCT 38.9 36.8 35.5* 34.0* 34.2*  MCV 90.0 92.0 90.8 92.1 92.4  PLT 369 385 402* 403* 398     Basic Metabolic Panel: Recent Labs  Lab 08/27/21 2018 08/28/21 0001 08/28/21 0343 08/29/21 0256 08/30/21 0132 08/31/21 0402  NA 134*  --  135 135 134* 135  K 3.3*  --  3.4* 3.4* 3.9 3.9  CL 101  --  106 105 105 108  CO2 23  --  20* 23 21* 19*  GLUCOSE 354*  --  69* 214* 279* 123*  BUN 29*  --  26* 25* 40* 37*  CREATININE 2.33*  --  2.19* 2.21* 2.27* 2.43*  CALCIUM 7.8*  --  7.5* 7.9* 8.1* 7.8*  MG  --  2.0  --   --  2.0  --   PHOS  --  4.2  --   --   --   --      GFR: Estimated Creatinine Clearance: 35.3 mL/min (A) (by C-G formula based on SCr of 2.43 mg/dL (H)).  Liver Function Tests: Recent Labs  Lab 08/28/21 0001 08/28/21 0343  AST 13* 11*  ALT 15 13  ALKPHOS 134* 119  BILITOT 0.6 0.7  PROT 6.0* 5.7*  ALBUMIN 2.0* 2.0*      Coagulation Profile: Recent Labs  Lab 08/28/21 0001  INR 1.1     Cardiac Enzymes: Recent Labs  Lab 08/28/21 0001  CKTOTAL 54      CBG: Recent Labs  Lab 08/30/21 0833 08/30/21 1018 08/30/21 1648 08/30/21 2043 08/31/21 0743  GLUCAP 240* 247* 352* 252* 127*       Recent Results (from the past 240 hour(s))  Culture, blood (x 2)     Status: None (Preliminary result)   Collection Time: 08/27/21 10:53 PM   Specimen: BLOOD  Result Value Ref Range Status   Specimen Description BLOOD LEFT ANTECUBITAL  Final   Special Requests   Final    BOTTLES DRAWN AEROBIC AND ANAEROBIC Blood Culture adequate volume   Culture   Final    NO GROWTH 4 DAYS Performed at [facility], [address], [place], [place] [address]    Report Status PENDING  Incomplete  Urine Culture     Status: Abnormal   Collection Time: 08/28/21  1:40 AM   Specimen: Urine, Clean Catch  Result Value Ref Range Status    Specimen Description URINE, CLEAN CATCH  Final   Special Requests   Final    NONE Performed at [facility], [address] [place], [place] [address]    Culture MULTIPLE SPECIES PRESENT, SUGGEST RECOLLECTION (A)  Final   Report Status 08/29/2021 FINAL  Final  MRSA Next Gen by PCR, Nasal     Status: None   Collection Time: 08/28/21  1:40 AM   Specimen: Nasal Mucosa; Nasal Swab  Result Value Ref Range Status   MRSA by PCR Next Gen NOT DETECTED NOT DETECTED Final    Comment: (NOTE) The GeneXpert MRSA Assay (FDA approved for NASAL specimens only), is one component of a comprehensive MRSA colonization surveillance program. It is not intended to diagnose MRSA infection nor to guide or monitor treatment for MRSA infections. Test performance is not FDA approved in patients less than 48 years old. Performed at [facility], [address] [place], [place] [address]   Aerobic/Anaerobic Culture w Gram Stain (surgical/deep wound)     Status: None (Preliminary result)   Collection Time: 08/30/21  7:57 AM   Specimen: Wound  Result Value Ref Range Status   Specimen Description WOUND  Final   Special Requests LEFT FOOT WOUND SPEC A  Final   Gram Stain   Final    NO WBC SEEN RARE GRAM POSITIVE COCCI IN PAIRS Performed at [facility], [address], [place], [place] [address]    Culture PENDING  Incomplete   Report Status PENDING  Incomplete  Aerobic/Anaerobic Culture w Gram Stain (surgical/deep wound)     Status: None (Preliminary result)   Collection Time: 08/30/21  8:05 AM   Specimen: Wound  Result Value Ref Range Status   Specimen Description WOUND  Final   Special Requests LEFT FOOT WOUND CLEAN SPEC B  Final   Gram Stain   Final    NO WBC SEEN RARE GRAM POSITIVE COCCI IN PAIRS Performed at [facility], [address], [place], [place] [address]    Culture PENDING  Incomplete   Report Status PENDING  Incomplete  Aerobic/Anaerobic Culture w Gram Stain  (surgical/deep wound)     Status: None (Preliminary result)   Collection Time: 08/30/21  8:11 AM   Specimen: PATH Bone biopsy; Tissue  Result Value Ref Range Status   Specimen Description TISSUE  Final   Special Requests LEFT FOOT SPEC C  Final   Gram Stain   Final    NO WBC SEEN NO ORGANISMS SEEN Performed at [facility], [address], [place], [place] [address]    Culture PENDING  Incomplete   Report Status PENDING  Incomplete       Radiology Studies: No results found.     LOS: 4 days   Manju Kulkarni [employer] on [email or web address]  08/31/2021, 10:36 AM

## 2021-08-31 NOTE — TOC Progression Note (Signed)
Transition of Care (TOC) - Progression Note  ? ? ?Patient Details  ?Name: Lauren [name] ?MRN: [id number] ?Date of Birth: 05/30/1980 ? ?Transition of Care (TOC) CM/SW Contact  ?Tom-Johnson, Renea Ee, RN ?Phone Number: ?08/31/2021, 1:49 PM ? ?Clinical Narrative:    ? ?Patient had I&D to left foot with bone biopsy yesterday, 08/30/21. To return to OR in few days. IV Cefepime changed to Meropenem due to allergic reaction. No PT/OT f/u noted. CM will continue to follow with needs.  ? ?Expected Discharge Plan: Home/Self Care ?Barriers to Discharge: Continued Medical Work up ? ?Expected Discharge Plan and Services ?Expected Discharge Plan: Home/Self Care ?  ?Discharge Planning Services: CM Consult ?Post Acute Care Choice: NA ?Living arrangements for the past 2 months: [place] ?                ?DME Arranged: N/A ?DME Agency: NA ?  ?  ?  ?  ?[place] Agency: NA ?  ?  ?  ? ? ?Social Determinants of Health (SDOH) Interventions ?  ? ?Readmission Risk Interventions ?   ? View : No data to display.  ?  ?  ?  ? ? ?

## 2021-08-31 NOTE — Progress Notes (Addendum)
Inpatient Diabetes Program Recommendations ? ?AACE/ADA: New Consensus Statement on Inpatient Glycemic Control (2015) ? ?Target Ranges:  Prepandial:   less than 140 mg/dL ?     Peak postprandial:   less than 180 mg/dL (1-2 hours) ?     Critically ill patients:  140 - 180 mg/dL  ? ?Lab Results  ?Component Value Date  ? GLUCAP 127 (H) 08/31/2021  ? HGBA1C 9.8 (H) 06/19/2021  ? ? ?Review of Glycemic Control ? Latest Reference Range & Units 08/30/21 10:18 08/30/21 16:48 08/30/21 20:43 08/31/21 07:43  ?Glucose-Capillary 70 - 99 mg/dL 247 (H) 352 (H) 252 (H) 127 (H)  ?(H): Data is abnormally high ?Diabetes history: DM1 (requires basal/bolus) ?Outpatient Diabetes medications: Semglee 40 units bid, Novolog 2-10 tid meal coverage, Jardiance 10 mg qd ?Current orders for Inpatient glycemic control: Semglee 25 units hs, 18 units am, Novolog 0-15 units tid, 0-5 units hs ? ?Inpatient Diabetes Program Recommendations:   ? ?Spoke with patient regarding outpatient diabetes management. Patient denies missing doses. Confirms medications above. Patient reports that she is in the process of working for prior authorization on medications. Is aware of [employer] products and has used them previously.  ?Reviewed patient's current A1c of 9.8% which is down from 2017. Explained what a A1c is and what it measures. Also reviewed goal A1c with patient, importance of good glucose control @ home, and blood sugar goals. Reviewed patho of DM, need for continued improvement, impact to healing, glucose trends while inpatient, current dosages, vascular changes and commorbidities. ?Patient has a meter and needed supplies. Reviewed when to call MD. Discussed role of endocrinology and patient is interested. Will attach list to DC summary. No further questions at this time.  ? ?Thanks, ?Bronson Curb, MSN, RNC-OB ?Diabetes Coordinator ?[phone] (8a-5p) ? ? ?

## 2021-08-31 NOTE — Progress Notes (Signed)
?  Subjective:  ?Patient ID: Lauren [name], female    DOB: 10/01/1980,  MRN: [id number] ? ?Patient seen in room resting comfortably in bed daughters at bedside with her ?Negative for chest pain and shortness of breath ?Fever: no ?Night sweats: no ?Constitutional signs: no ?Objective:  ? ?Vitals:  ? 08/31/21 0837 08/31/21 1713  ?BP: 128/77 125/73  ?Pulse: 80 96  ?Resp: 16 17  ?Temp: 98.6 ?F (37 ?C) 98.3 ?F (36.8 ?C)  ?SpO2: 95% 96%  ? ?General AA&O x3. Normal mood and affect.  ?Vascular Dorsalis pedis and posterior tibial pulses 2/4 bilat. ?Brisk capillary refill to all digits. Pedal hair present.  ?Neurologic Epicritic sensation grossly absent.  ?Dermatologic Open surgical wound with exposed fifth metatarsal head and extensor tendon complex, there is cyanosis and pallor of the fifth toe  ?Orthopedic: MMT 5/5 in dorsiflexion, plantarflexion, inversion, and eversion. ?Normal joint ROM without pain or crepitus.  ? ? ?Assessment & Plan:  ?Patient was evaluated and treated and all questions answered. ? ?Left foot diabetic foot infection ?-Dressing change.  No gross purulence found there was some serous drainage coming from the toe.  I discussed with her and her daughter that the toe is likely not to survive due to the surgery and severity of the infection that was present.  I recommend we return to the operating room for amputation of the toe, fifth metatarsal head and wound washout.  Expect will need to return 1 more time after that for final wound closure.  They understand wish to proceed. ?-N.p.o. past 0900 ?-Surgery be scheduled for tomorrow evening ?-WBAT in postop shoe ?-Culture data with group B strep ? ?Criselda Peaches, DPM ? ?Accessible via secure chat for questions or concerns. ? ?

## 2021-08-31 NOTE — Discharge Instructions (Addendum)
Local Endocrinologists ?[facility] [phone]) ?Dr. Philemon Kingdom ?Dr. Elayne Snare ?[facility] Shamleffer ?Dr. Renato Shin ?[facility] [phone]) ?Dr. Delrae Rend ?[facility] [phone]) ?Dr. Jacelyn Pi ?Dr. Larna Daughters Averneni ?[place] [phone]) ?Dr. Reynold Bowen ?[facility] [phone]) [[place] office]  [phone]) [[place] office] ?Dr. Lavone Orn ?Dr. Mee Hives ?Dr. Bynum Bellows [facility]) [phone]) [phone]  [address] [phone]) ?[facility]) [phone]) ?Autumn Hudnall Ronnald Ramp), PA ?Dr. Amalia Greenhouse ?Dr. Marsh Dolly. ?Dr. Alanson Aly (private practice) [facility] [phone] [phone]) ([phone]) ?Dr. Salomon Fick (private practice) ([address]) [phone]) ?[place]) ([place]) [phone]) ?Dr. Kathlene Cote ?Jacolyn Reedy, NP ?[facility], PA ?Dr. Legrand Como [place] ? ? ? ? ?Per podiatry instructions: take 7 days po Augmentin, leave dressing in place, podiatry to call Monday and set up and appointment, WBAT in the shoe you have. ?

## 2021-09-01 ENCOUNTER — Ambulatory Visit: Payer: No Typology Code available for payment source | Admitting: Podiatry

## 2021-09-01 DIAGNOSIS — E039 Hypothyroidism, unspecified: Secondary | ICD-10-CM | POA: Diagnosis not present

## 2021-09-01 DIAGNOSIS — I10 Essential (primary) hypertension: Secondary | ICD-10-CM | POA: Diagnosis not present

## 2021-09-01 DIAGNOSIS — N1832 Chronic kidney disease, stage 3b: Secondary | ICD-10-CM | POA: Diagnosis not present

## 2021-09-01 DIAGNOSIS — L97521 Non-pressure chronic ulcer of other part of left foot limited to breakdown of skin: Secondary | ICD-10-CM | POA: Diagnosis not present

## 2021-09-01 DIAGNOSIS — L03116 Cellulitis of left lower limb: Secondary | ICD-10-CM | POA: Diagnosis not present

## 2021-09-01 LAB — BASIC METABOLIC PANEL
Anion gap: 9 (ref 5–15)
BUN: 44 mg/dL — ABNORMAL HIGH (ref 6–20)
CO2: 21 mmol/L — ABNORMAL LOW (ref 22–32)
Calcium: 8.1 mg/dL — ABNORMAL LOW (ref 8.9–10.3)
Chloride: 104 mmol/L (ref 98–111)
Creatinine, Ser: 2.59 mg/dL — ABNORMAL HIGH (ref 0.44–1.00)
GFR, Estimated: 23 mL/min — ABNORMAL LOW (ref >60)
Glucose, Bld: 208 mg/dL — ABNORMAL HIGH (ref 70–99)
Potassium: 4 mmol/L (ref 3.5–5.1)
Sodium: 134 mmol/L — ABNORMAL LOW (ref 135–145)

## 2021-09-01 LAB — CBC
HCT: 32.5 % — ABNORMAL LOW (ref 36.0–46.0)
Hemoglobin: 10.7 g/dL — ABNORMAL LOW (ref 12.0–15.0)
MCH: 30.3 pg (ref 26.0–34.0)
MCHC: 32.9 g/dL (ref 30.0–36.0)
MCV: 92.1 fL (ref 80.0–100.0)
Platelets: 429 10*3/uL — ABNORMAL HIGH (ref 150–400)
RBC: 3.53 MIL/uL — ABNORMAL LOW (ref 3.87–5.11)
RDW: 12.8 % (ref 11.5–15.5)
WBC: 15.7 10*3/uL — ABNORMAL HIGH (ref 4.0–10.5)
nRBC: 0 % (ref 0.0–0.2)

## 2021-09-01 LAB — GLUCOSE, CAPILLARY
Glucose-Capillary: 230 mg/dL — ABNORMAL HIGH (ref 70–99)
Glucose-Capillary: 204 mg/dL — ABNORMAL HIGH (ref 70–99)
Glucose-Capillary: 176 mg/dL — ABNORMAL HIGH (ref 70–99)
Glucose-Capillary: 111 mg/dL — ABNORMAL HIGH (ref 70–99)
Glucose-Capillary: 209 mg/dL — ABNORMAL HIGH (ref 70–99)
Glucose-Capillary: 270 mg/dL — ABNORMAL HIGH (ref 70–99)

## 2021-09-01 LAB — SURGICAL PATHOLOGY

## 2021-09-01 MED ORDER — SODIUM CHLORIDE 0.9 % IV SOLN
3.0000 g | Freq: Four times a day (QID) | INTRAVENOUS | Status: AC
  Administered 2021-09-01 – 2021-09-04 (×12): 3 g via INTRAVENOUS
  Filled 2021-09-01 (×14): qty 8

## 2021-09-01 MED ORDER — FUROSEMIDE 10 MG/ML IJ SOLN
60.0000 mg | Freq: Once | INTRAMUSCULAR | Status: AC
Start: 2021-09-01 — End: 2021-09-01
  Administered 2021-09-01: 60 mg via INTRAVENOUS
  Filled 2021-09-01: qty 6

## 2021-09-01 NOTE — Progress Notes (Signed)
Inpatient Diabetes Program Recommendations ? ?AACE/ADA: New Consensus Statement on Inpatient Glycemic Control  ? ?Target Ranges:  Prepandial:   less than 140 mg/dL ?     Peak postprandial:   less than 180 mg/dL (1-2 hours) ?     Critically ill patients:  140 - 180 mg/dL  ? ? Latest Reference Range & Units 08/31/21 07:43 08/31/21 12:24 08/31/21 15:27 08/31/21 16:38 08/31/21 21:20 09/01/21 07:28  ?Glucose-Capillary 70 - 99 mg/dL 127 (H) 148 (H) 173 (H) 212 (H) 263 (H) 230 (H)  ? ?Review of Glycemic Control ? ?Diabetes history: DM1 (requires basal/bolus) ?Outpatient Diabetes medications: Semglee 40 units BID, Novolog 2-10 TID meal coverage, Jardiance 10 mg daily ?Current orders for Inpatient glycemic control: Semglee 18 units QAM, Semglee 28 units QHS,  Novolog 0-15 units TID, 0-5 units HS ?  ?Inpatient Diabetes Program Recommendations:   ? ?Insulin: Once diet is resumed, please consider ordering Novolog 3 units TID with meals for meal coverage if patient eats at least 50% of meals. ? ?Thanks, ?Barnie Alderman, RN, MSN, CDE ?Diabetes Coordinator ?Inpatient Diabetes Program ?[phone] (Team Pager from 8am to 5pm) ? ?

## 2021-09-01 NOTE — Consult Note (Signed)
?  [place] for Infectious Disease  ? ? ? ? ? ?Reason for Consult: diabetic foot infection   ?Referring Physician: Dr. Maryland Pink ? ?Principal Problem: ?  Foot ulcer ([place]) ?Active Problems: ?  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  Hypothyroidism ?  Essential hypertension ?  Mixed hyperlipidemia ?  Class 2 obesity ?  CAD (coronary artery disease) ?  CKD stage G3b/A2, GFR 30-44 and albumin creatinine ratio 30-299 mg/g ([facility]) ?  Chronic combined systolic and diastolic CHF (congestive heart failure) ([place]) ?  History of CVA (cerebrovascular accident) ?  Sepsis ([place]) ?  Hypokalemia ? ? ? atorvastatin  40 mg Oral Daily  ? fenofibrate  160 mg Oral Daily  ? heparin injection (subcutaneous)  5,000 Units Subcutaneous Q8H  ? insulin aspart  0-15 Units Subcutaneous TID WC  ? insulin aspart  0-5 Units Subcutaneous QHS  ? insulin glargine-yfgn  18 Units Subcutaneous Daily  ? insulin glargine-yfgn  28 Units Subcutaneous QHS  ? levothyroxine  200 mcg Oral Q0600  ? metoprolol succinate  25 mg Oral Daily  ? multivitamin with minerals  1 tablet Oral Daily  ? nutrition supplement (JUVEN)  1 packet Oral BID BM  ? pantoprazole  40 mg Oral Daily  ? ? ?Recommendations: ?Will change antibiotics to ampicillin/sulbactam ? ?Assessment: ?She has a diabetic foot ulcer with bacterial growth and plan for debridement today.  Will change to ampicillin and monitor OR cultures.   ? ?Will continue to follow ? ?Antibiotics: ?Day 6 total antibiotics ? ?HPI: Lauren [name] is a 41 y.o. female with a history of type 1 diabetes, hypertension, CAD and history of CVA came in to the ED on 4/27 with progressive left foot swelling and redness and had been on oral antibiotics.  Podiatry is following and did an MRI on 4/28 and noted a developing abscess and fifth metatarsal head with some mild reactive marrow.  Plan for operative treatment today with likely amputation of the toe along with debridement.  Cultures have been sent and each  culture positive for group B Streptococcus.   ? ? ?Review of Systems: ? Constitutional: negative for fevers and chills ?Gastrointestinal: negative for nausea and diarrhea ?All other systems reviewed and are negative   ? ?Past Medical History:  ?Diagnosis Date  ? Anemia   ? CAD (coronary artery disease)   ? a. s/p cath in 03/2014 showing 30% mid-LAD, moderate to severe disease along small D1, patent LCx, moderate to severe distal OM2 stenosis and moderate diffuse diease along RCA not amenable to PCI  ? CHF (congestive heart failure) ([place])   ? a. EF 55-60% in 12/2019 b. EF at 35-40% by echo in 05/2020  ? Diabetes mellitus without complication ([place])   ? Myocardial infarction [facility])   ? Neuropathy   ? Renal disorder   ? stage 3 kidney disease  ? Stroke [facility])   ? ? ?Social History  ? ?Tobacco Use  ? Smoking status: Former  ?  Packs/day: 0.25  ?  Types: Cigarettes  ?  Quit date: 10/27/2020  ?  Years since quitting: 0.8  ? Smokeless tobacco: Never  ?Vaping Use  ? Vaping Use: Never used  ?Substance Use Topics  ? Alcohol use: No  ?  Alcohol/week: 0.0 standard drinks  ? Drug use: No  ? ? ?Family History  ?Problem Relation Age of Onset  ? Thyroid disease Mother   ? Hypertension Mother   ? Hyperlipidemia Mother   ? CAD  Mother   ? CVA Mother   ? Hyperlipidemia Father   ? Hypertension Father   ? CAD Father   ? Breast cancer Paternal Grandmother 64  ? Cancer Paternal Grandmother   ?     lung  ? ? ?Allergies  ?Allergen Reactions  ? Wellbutrin [Bupropion] Hives  ? Cefepime Rash  ?  Tolerates penicilllin  ? Ciprofloxacin Hcl Hives and Rash  ?  Hives/rash at injection site   ? Tape Rash  ? ? ?Physical Exam: ?Constitutional: in no apparent distress  ?Vitals:  ? 09/01/21 0455 09/01/21 2707  ?BP: 93/60 (!) 141/92  ?Pulse: 89 87  ?Resp: 18   ?Temp: 98.6 ?F (37 ?C)   ?SpO2: 94%   ? ?EYES: anicteric ?ENMT: no thrush ?Respiratory: normal respiratory effort ?Musculoskeletal: left foot wrapped ?Skin: no rash ? ?Lab Results  ?Component Value Date   ? WBC 15.7 (H) 09/01/2021  ? HGB 10.7 (L) 09/01/2021  ? HCT 32.5 (L) 09/01/2021  ? MCV 92.1 09/01/2021  ? PLT 429 (H) 09/01/2021  ?  ?Lab Results  ?Component Value Date  ? CREATININE 2.59 (H) 09/01/2021  ? BUN 44 (H) 09/01/2021  ? NA 134 (L) 09/01/2021  ? K 4.0 09/01/2021  ? CL 104 09/01/2021  ? CO2 21 (L) 09/01/2021  ?  ?Lab Results  ?Component Value Date  ? ALT 13 08/28/2021  ? AST 11 (L) 08/28/2021  ? ALKPHOS 119 08/28/2021  ?  ? ?Microbiology: ?Recent Results (from the past 240 hour(s))  ?Culture, blood (x 2)     Status: None (Preliminary result)  ? Collection Time: 08/27/21 10:53 PM  ? Specimen: BLOOD  ?Result Value Ref Range Status  ? Specimen Description BLOOD LEFT ANTECUBITAL  Final  ? Special Requests   Final  ?  BOTTLES DRAWN AEROBIC AND ANAEROBIC Blood Culture adequate volume  ? Culture   Final  ?  NO GROWTH 4 DAYS ?Performed at [facility], [place] [address], [place], [place] [address] ?  ? Report Status PENDING  Incomplete  ?Urine Culture     Status: Abnormal  ? Collection Time: 08/28/21  1:40 AM  ? Specimen: Urine, Clean Catch  ?Result Value Ref Range Status  ? Specimen Description URINE, CLEAN CATCH  Final  ? Special Requests   Final  ?  NONE ?Performed at [facility], [place] [address], [place], [place] [address] ?  ? Culture MULTIPLE SPECIES PRESENT, SUGGEST RECOLLECTION (A)  Final  ? Report Status 08/29/2021 FINAL  Final  ?MRSA Next Gen by PCR, Nasal     Status: None  ? Collection Time: 08/28/21  1:40 AM  ? Specimen: Nasal Mucosa; Nasal Swab  ?Result Value Ref Range Status  ? MRSA by PCR Next Gen NOT DETECTED NOT DETECTED Final  ?  Comment: (NOTE) ?The GeneXpert MRSA Assay (FDA approved for NASAL specimens only), ?is one component of a comprehensive MRSA colonization surveillance ?program. It is not intended to diagnose MRSA infection nor to guide ?or monitor treatment for MRSA infections. ?Test performance is not FDA approved in patients less than 2 years ?old. ?Performed at [facility], [place] [address] [place], [place] ?[address] ?  ?Aerobic/Anaerobic Culture w Gram Stain (surgical/deep wound)     Status: None (Preliminary result)  ? Collection Time: 08/30/21  7:57 AM  ? Specimen: Wound  ?Result Value Ref Range Status  ? Specimen Description WOUND  Final  ? Special Requests LEFT FOOT WOUND SPEC A  Final  ? Gram Stain   Final  ?  NO WBC SEEN ?RARE GRAM POSITIVE COCCI IN PAIRS ?Performed at [facility], [place] [address] [place], [place] [address] ?  ? Culture   Final  ?  FEW GROUP B STREP(S.AGALACTIAE)ISOLATED ?TESTING AGAINST S. AGALACTIAE NOT ROUTINELY PERFORMED DUE TO PREDICTABILITY OF AMP/PEN/VAN SUSCEPTIBILITY. ?NO ANAEROBES ISOLATED; CULTURE IN PROGRESS FOR 5 DAYS ?  ? Report Status PENDING  Incomplete  ?Aerobic/Anaerobic Culture w Gram Stain (surgical/deep wound)     Status: None (Preliminary result)  ? Collection Time: 08/30/21  8:05 AM  ? Specimen: Wound  ?Result Value Ref Range Status  ? Specimen Description WOUND  Final  ? Special Requests LEFT FOOT WOUND CLEAN SPEC B  Final  ? Gram Stain NO WBC SEEN ?RARE GRAM POSITIVE COCCI IN PAIRS ?  Final  ? Culture   Final  ?  FEW GROUP B STREP(S.AGALACTIAE)ISOLATED ?TESTING AGAINST S. AGALACTIAE NOT ROUTINELY PERFORMED DUE TO PREDICTABILITY OF AMP/PEN/VAN SUSCEPTIBILITY. ?Performed at [facility], [place] [address], [place], [place] [address] ?  ? Report Status PENDING  Incomplete  ?Aerobic/Anaerobic Culture w Gram Stain (surgical/deep wound)     Status: None (Preliminary result)  ? Collection Time: 08/30/21  8:11 AM  ? Specimen: PATH Bone biopsy; Tissue  ?Result Value Ref Range Status  ? Specimen Description TISSUE  Final  ? Special Requests LEFT FOOT SPEC C  Final  ? Gram Stain   Final  ?  NO WBC SEEN ?NO ORGANISMS SEEN ?Performed at [facility], [place] [address], [place],  [address] ?  ? Culture   Final  ?  RARE GROUP B STREP(S.AGALACTIAE)ISOLATED ?TESTING AGAINST S. AGALACTIAE NOT ROUTINELY PERFORMED DUE TO  PREDICTABILITY OF AMP/PEN/VAN SUSCEPTIBILITY. ?NO ANAEROBES ISOLATED; CULTURE IN PROGRESS FOR 5 DAYS ?  ? Report Status PENDING  Incomplete  ? ? ?Thayer Headings, MD ?[facility] ?[facility]

## 2021-09-01 NOTE — Progress Notes (Signed)
Mobility Specialist: Progress Note ? ? 09/01/21 1220  ?Mobility  ?Activity Ambulated independently in hallway  ?Level of Assistance Independent  ?Assistive Device None  ?LLE Weight Bearing PWB  ?Distance Ambulated (ft) 530 ft  ?Activity Response Tolerated well  ?$Mobility charge 1 Mobility  ? ?Received pt in chair having no complaints and agreeable to mobility. Cues at the beginning of ambulation for heel WB during session. Asymptomatic throughout ambulation, returned back to chair w/ call bell in reach and all needs met. Family present in the room.  ? ?Lauren [name] ?Mobility Specialist ?Mobility Specialist [place]: [phone] ?Mobility Specialist [place]: [phone] ? ?

## 2021-09-01 NOTE — Progress Notes (Signed)
? ?[facility] ?PROGRESS NOTE ? ? ?Lauren [name] ZOX:[id number] DOB: 11-16-1980 DOA: 08/27/2021 ? ?PCP: Lemmie Evens, MD ? ?Brief History/Interval Summary: 41 y.o. female with medical history significant of DM1, HTN, chronic systolic CHF, hypothyroidism, CKD stage IIIa, HLD, class II obesity, CAD, history of CVA presented with progressive worsening of the redness in her left foot.  Pain also worsened over several days prior to admission.  Patient was found to have evidence for cellulitis.  She was noted to have an ulcer with concern for osteomyelitis.  She was hospitalized for further management.  Placed on IV antibiotics. ? ?Consultants: Podiatry.  Infectious disease ? ?Procedures:  ?ABIs. ? ?Irrigation and debridement left foot with bone biopsy ? ? ? ?Subjective/Interval History: ?Patient feels well this morning.  Pain is reasonably well controlled.  Itching episodes and rash appears to be resolving.    ? ? ?Assessment/Plan: ? ?Cellulitis of the left foot with diabetic ulcer ?No clear evidence for sepsis at the time of admission. ?Patient currently on broad-spectrum antibiotics with vancomycin cefepime and metronidazole. ?Plain films did not show any osseous abnormality.  MRI of the foot raises concern for abscess and osteomyelitis involving the fifth metatarsal head. ?Patient seen by podiatry. ABIs showed mild right lower extremity arterial disease.  The left ABI was within normal range. ?Underwent irrigation and debridement of the left foot in the OR by podiatry on 4/30. Bone biopsy was done.  Apparently the wound had to be left open.  Seen by podiatry again yesterday.  Plan is for her to go back to the OR today for amputation of the fifth metatarsal head and the toe. ?Gram stain shows gram-positive cocci in pairs.  Follow-up on culture data. ?WBC remains elevated but noted to be better today.   ?Has had episodes of fever during this hospital stay. ESR noted to be elevated at 80.  CRP 19.3.   Procalcitonin 0.55.  Lactic acid level was normal. ?Appeared to have developed some kind of allergic reaction to cefepime with rash and itching.  Cefepime was changed over to meropenem yesterday.  Continue for now.  Also on vancomycin and metronidazole.   ?Cultures sent after surgery grew strep agalactiae.  Will discuss with ID.  Can likely stop the meropenem and Flagyl. ? 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  On Lantus insulin at home at 40 units twice a day.  Was initiated on low-dose of long-acting insulin due to hypoglycemia.   ?Higher glucose levels noted.  But will not make any changes to her insulin regimen today since she has to be n.p.o. again for her surgery.  Will need dose adjustment starting tomorrow.    ? ?Essential hypertension ?Blood pressure reasonably well controlled.  Continue metoprolol. ? ?Chronic kidney disease stage IIIb ?Baseline creatinine is between 2.2-3.0.  Avoid nephrotoxic agents.  Creatinine has been stable for the most part.  Slight increase noted.  Bicarbonate level is stable.  Monitor urine output.   ? ?Hypothyroidism ?TSH noted to be quite elevated at 25.  Mentions that she has been compliant with her thyroid medications.  Dose of levothyroxine was increased.  TSH to be rechecked in a few weeks. ? ?Hyperlipidemia ?Continue statin and fenofibrate. ? ?History of stroke ?Plavix is currently on hold due to need for surgical procedure.  Continue statin.  ? ?Coronary artery disease ?Cardiac status seems to be stable.  Continue statin.  Home medication list reviewed.  Plavix currently on hold.  Continue metoprolol. ? ?Chronic  combined systolic and diastolic CHF ?Last echocardiogram report is from January 2022 which revealed EF of 35 to 40% with grade 3 diastolic dysfunction.  Vania Rea is on hold.  Continue metoprolol.  No ACE inhibitor or ARB or Entresto due to elevated creatinine. ?Strict ins and outs and daily weights. ?Patient mentions feeling short  of breath when she lays flat on the bed.  Might be accumulating fluid.  We will give her a dose of IV Lasix today and reevaluate tomorrow. ? ?Hypokalemia ?Potassium level has improved.  Magnesium 2.0. ? ?Obesity ?Estimated body mass index is 34.63 kg/m? as calculated from the following: ?  Height as of this encounter: 5' 5" (1.651 m). ?  Weight as of this encounter: 94.4 kg. ? ? ?DVT Prophylaxis: Subcutaneous heparin ?Code Status: Full code ?Family Communication: Discussed with patient ?Disposition Plan: Hopefully return home when improved ? ?Status is: Inpatient ?Remains inpatient appropriate because: Left foot cellulitis with concern for osteomyelitis.  Requiring IV antibiotics. ? ? ? ? ? ?Medications: Scheduled: ? atorvastatin  40 mg Oral Daily  ? fenofibrate  160 mg Oral Daily  ? heparin injection (subcutaneous)  5,000 Units Subcutaneous Q8H  ? insulin aspart  0-15 Units Subcutaneous TID WC  ? insulin aspart  0-5 Units Subcutaneous QHS  ? insulin glargine-yfgn  18 Units Subcutaneous Daily  ? insulin glargine-yfgn  28 Units Subcutaneous QHS  ? levothyroxine  200 mcg Oral Q0600  ? metoprolol succinate  25 mg Oral Daily  ? multivitamin with minerals  1 tablet Oral Daily  ? nutrition supplement (JUVEN)  1 packet Oral BID BM  ? pantoprazole  40 mg Oral Daily  ? ?Continuous: ? meropenem (MERREM) IV 1 g (09/01/21 9476)  ? vancomycin Stopped (09/01/21 0130)  ? ?LYY:[id number] **OR** acetaminophen, diphenhydrAMINE, fentaNYL (SUBLIMAZE) injection, HYDROcodone-acetaminophen, ondansetron (ZOFRAN) IV ? ?Antibiotics: ?Anti-infectives (From admission, onward)  ? ? Start     Dose/Rate Route Frequency Ordered Stop  ? 08/31/21 2200  meropenem (MERREM) 1 g in sodium chloride 0.9 % 100 mL IVPB       ? 1 g ?200 mL/hr over 30 Minutes Intravenous Every 12 hours 08/31/21 1340    ? 08/31/21 1000  meropenem (MERREM) 2 g in sodium chloride 0.9 % 100 mL IVPB  Status:  Discontinued       ? 2 g ?280 mL/hr over 30 Minutes Intravenous Every  12 hours 08/31/21 0856 08/31/21 1340  ? 08/29/21 2200  vancomycin (VANCOREADY) IVPB 1500 mg/300 mL       ? 1,500 mg ?150 mL/hr over 120 Minutes Intravenous Every 48 hours 08/27/21 2320 09/08/21 2159  ? 08/27/21 2330  ceFEPIme (MAXIPIME) 2 g in sodium chloride 0.9 % 100 mL IVPB  Status:  Discontinued       ? 2 g ?200 mL/hr over 30 Minutes Intravenous Every 12 hours 08/27/21 2320 08/31/21 0841  ? 08/27/21 2330  vancomycin (VANCOREADY) IVPB 2000 mg/400 mL       ? 2,000 mg ?200 mL/hr over 120 Minutes Intravenous  Once 08/27/21 2320 08/28/21 0349  ? 08/27/21 2315  metroNIDAZOLE (FLAGYL) IVPB 500 mg  Status:  Discontinued       ? 500 mg ?100 mL/hr over 60 Minutes Intravenous Every 12 hours 08/27/21 2305 08/31/21 0904  ? 08/27/21 2200  vancomycin (VANCOCIN) IVPB 1000 mg/200 mL premix  Status:  Discontinued       ? 1,000 mg ?200 mL/hr over 60 Minutes Intravenous  Once 08/27/21 2152 08/27/21 2320  ? 08/27/21 2200  piperacillin-tazobactam (ZOSYN) IVPB 3.375 g  Status:  Discontinued       ? 3.375 g ?100 mL/hr over 30 Minutes Intravenous  Once 08/27/21 2152 08/27/21 2322  ? ?  ? ? ?Objective: ? ?Vital Signs ? ?Vitals:  ? 08/31/21 1713 08/31/21 2122 09/01/21 0455 09/01/21 6644  ?BP: 125/73 109/62 93/60 (!) 141/92  ?Pulse: 96 87 89 87  ?Resp: _0 ?Temp: 98.3 ?F (36.8 ?C) 98.5 ?F (36.9 ?C) 98.6 ?F (37 ?C)   ?TempSrc: Oral Oral Oral   ?SpO2: 96% 96% 94%   ?Weight:      ?Height:      ? ? ?Intake/Output Summary (Last 24 hours) at 09/01/2021 0945 ?Last data filed at 09/01/2021 0347 ?Gross per 24 hour  ?Intake 1515.1 ml  ?Output --  ?Net 1515.1 ml  ? ? ?Filed Weights  ? 08/27/21 1957 08/28/21 0127 08/30/21 0717  ?Weight: 97.5 kg 94.4 kg 94.4 kg  ? ? ?General appearance: Awake alert.  In no distress ?Resp: Normal effort at rest.  Few crackles at the bases. ?Cardio: S1-S2 is normal regular.  No S3-S4.  No rubs murmurs or bruit ?GI: Abdomen is soft.  Nontender nondistended.  Bowel sounds are present normal.  No masses  organomegaly ?Extremities: Mild edema bilateral lower extremities.  Left more than right due to the infection in the left foot.  Left foot covered in dressing. ?Neurologic: Alert and oriented x3.  No focal neurological deficit

## 2021-09-01 NOTE — Progress Notes (Signed)
?  Subjective:  ?Patient ID: Lauren [name], female    DOB: Sep 22, 1980,  MRN: [id number] ? ?Patient seen in room resting comfortably in bed daughters at bedside with her ? ?Negative for chest pain and shortness of breath ?Fever: no ?Night sweats: no ?Constitutional signs: no ?Objective:  ? ?Vitals:  ? 09/01/21 1643 09/01/21 2049  ?BP: 118/79 (!) 141/96  ?Pulse: 93 94  ?Resp: 14 18  ?Temp: 98.1 ?F (36.7 ?C) 98.5 ?F (36.9 ?C)  ?SpO2: 99% 99%  ? ?General AA&O x3. Normal mood and affect.  ?Vascular Dorsalis pedis and posterior tibial pulses 2/4 bilat. ?Brisk capillary refill to all digits. Pedal hair present.  ?Neurologic Epicritic sensation grossly absent.  ?Dermatologic Open surgical wound with exposed fifth metatarsal head and extensor tendon complex, there is cyanosis and pallor of the fifth toe, no new purulence  ?Orthopedic: MMT 5/5 in dorsiflexion, plantarflexion, inversion, and eversion. ?Normal joint ROM without pain or crepitus.  ? ? ?Assessment & Plan:  ?Patient was evaluated and treated and all questions answered. ? ?Left foot diabetic foot infection ?-Dressing change.  No gross purulence found there was some serous drainage coming from the toe.  I discussed with her and her daughter that the toe is likely not to survive due to the surgery and severity of the infection that was present.  I recommend we return to the operating room for amputation of the toe, fifth metatarsal head and wound washout.  Expect will need to return 1 more time after that for final wound closure.  They understand wish to proceed. ?-N.p.o. past 0900 ?-Surgery  tomorrow evening ?-WBAT in postop shoe ?-Culture data with group B strep ? ?Criselda Peaches, DPM ? ?Accessible via secure chat for questions or concerns. ? ?

## 2021-09-02 ENCOUNTER — Encounter (HOSPITAL_COMMUNITY): Payer: Self-pay | Admitting: Internal Medicine

## 2021-09-02 ENCOUNTER — Encounter (HOSPITAL_COMMUNITY)
Admission: EM | Disposition: A | Discharge status: Home / Self Care | Payer: Self-pay | Source: Home / Self Care | Attending: Internal Medicine

## 2021-09-02 ENCOUNTER — Inpatient Hospital Stay (HOSPITAL_COMMUNITY): Payer: No Typology Code available for payment source | Admitting: Anesthesiology

## 2021-09-02 DIAGNOSIS — E11628 Type 2 diabetes mellitus with other skin complications: Secondary | ICD-10-CM

## 2021-09-02 DIAGNOSIS — E1169 Type 2 diabetes mellitus with other specified complication: Secondary | ICD-10-CM

## 2021-09-02 DIAGNOSIS — M869 Osteomyelitis, unspecified: Secondary | ICD-10-CM

## 2021-09-02 DIAGNOSIS — I2510 Atherosclerotic heart disease of native coronary artery without angina pectoris: Secondary | ICD-10-CM | POA: Diagnosis not present

## 2021-09-02 DIAGNOSIS — E669 Obesity, unspecified: Secondary | ICD-10-CM | POA: Diagnosis not present

## 2021-09-02 DIAGNOSIS — L97523 Non-pressure chronic ulcer of other part of left foot with necrosis of muscle: Secondary | ICD-10-CM

## 2021-09-02 DIAGNOSIS — I10 Essential (primary) hypertension: Secondary | ICD-10-CM | POA: Diagnosis not present

## 2021-09-02 DIAGNOSIS — L03116 Cellulitis of left lower limb: Secondary | ICD-10-CM

## 2021-09-02 DIAGNOSIS — I5042 Chronic combined systolic (congestive) and diastolic (congestive) heart failure: Secondary | ICD-10-CM | POA: Diagnosis not present

## 2021-09-02 DIAGNOSIS — L089 Local infection of the skin and subcutaneous tissue, unspecified: Secondary | ICD-10-CM

## 2021-09-02 DIAGNOSIS — M86172 Other acute osteomyelitis, left ankle and foot: Secondary | ICD-10-CM

## 2021-09-02 HISTORY — PX: AMPUTATION: SHX166

## 2021-09-02 LAB — GLUCOSE, CAPILLARY
Glucose-Capillary: 134 mg/dL — ABNORMAL HIGH (ref 70–99)
Glucose-Capillary: 138 mg/dL — ABNORMAL HIGH (ref 70–99)
Glucose-Capillary: 63 mg/dL — ABNORMAL LOW (ref 70–99)
Glucose-Capillary: 90 mg/dL (ref 70–99)
Glucose-Capillary: 77 mg/dL (ref 70–99)
Glucose-Capillary: 111 mg/dL — ABNORMAL HIGH (ref 70–99)
Glucose-Capillary: 66 mg/dL — ABNORMAL LOW (ref 70–99)
Glucose-Capillary: 73 mg/dL (ref 70–99)
Glucose-Capillary: 199 mg/dL — ABNORMAL HIGH (ref 70–99)

## 2021-09-02 LAB — BASIC METABOLIC PANEL
Anion gap: 7 (ref 5–15)
BUN: 40 mg/dL — ABNORMAL HIGH (ref 6–20)
CO2: 25 mmol/L (ref 22–32)
Calcium: 8.1 mg/dL — ABNORMAL LOW (ref 8.9–10.3)
Chloride: 106 mmol/L (ref 98–111)
Creatinine, Ser: 2.59 mg/dL — ABNORMAL HIGH (ref 0.44–1.00)
GFR, Estimated: 23 mL/min — ABNORMAL LOW (ref >60)
Glucose, Bld: 152 mg/dL — ABNORMAL HIGH (ref 70–99)
Potassium: 3.9 mmol/L (ref 3.5–5.1)
Sodium: 138 mmol/L (ref 135–145)

## 2021-09-02 LAB — SURGICAL PCR SCREEN
MRSA, PCR: NEGATIVE
Staphylococcus aureus: NEGATIVE

## 2021-09-02 LAB — CBC
HCT: 32.5 % — ABNORMAL LOW (ref 36.0–46.0)
Hemoglobin: 10.8 g/dL — ABNORMAL LOW (ref 12.0–15.0)
MCH: 30.4 pg (ref 26.0–34.0)
MCHC: 33.2 g/dL (ref 30.0–36.0)
MCV: 91.5 fL (ref 80.0–100.0)
Platelets: 460 10*3/uL — ABNORMAL HIGH (ref 150–400)
RBC: 3.55 MIL/uL — ABNORMAL LOW (ref 3.87–5.11)
RDW: 12.7 % (ref 11.5–15.5)
WBC: 14.5 10*3/uL — ABNORMAL HIGH (ref 4.0–10.5)
nRBC: 0 % (ref 0.0–0.2)

## 2021-09-02 SURGERY — AMPUTATION, FOOT, RAY
Anesthesia: Monitor Anesthesia Care | Laterality: Left

## 2021-09-02 MED ORDER — LACTATED RINGERS IV SOLN: INTRAVENOUS | Status: DC

## 2021-09-02 MED ORDER — BUPIVACAINE HCL (PF) 0.25 % IJ SOLN
INTRAMUSCULAR | Status: AC
  Filled 2021-09-02: qty 30

## 2021-09-02 MED ORDER — ORAL CARE MOUTH RINSE: 15.0000 mL | Freq: Once | OROMUCOSAL | Status: AC

## 2021-09-02 MED ORDER — DEXTROSE 50 % IV SOLN
12.5000 g | INTRAVENOUS | Status: AC
  Administered 2021-09-02: 12.5 g via INTRAVENOUS
  Filled 2021-09-02: qty 50

## 2021-09-02 MED ORDER — BUPIVACAINE HCL (PF) 0.25 % IJ SOLN
INTRAMUSCULAR | Status: DC | PRN
  Administered 2021-09-02: 17 mL

## 2021-09-02 MED ORDER — OXYCODONE HCL 5 MG PO TABS: 5.0000 mg | ORAL_TABLET | Freq: Once | ORAL | Status: DC | PRN

## 2021-09-02 MED ORDER — DEXTROSE 50 % IV SOLN
INTRAVENOUS | Status: DC | PRN
  Administered 2021-09-02: 12.5 g via INTRAVENOUS

## 2021-09-02 MED ORDER — ACETAMINOPHEN 10 MG/ML IV SOLN: 1000.0000 mg | Freq: Once | INTRAVENOUS | Status: DC | PRN

## 2021-09-02 MED ORDER — 0.9 % SODIUM CHLORIDE (POUR BTL) OPTIME
TOPICAL | Status: DC | PRN
  Administered 2021-09-02: 1000 mL

## 2021-09-02 MED ORDER — ACETAMINOPHEN 325 MG PO TABS: 325.0000 mg | ORAL_TABLET | ORAL | Status: DC | PRN

## 2021-09-02 MED ORDER — SODIUM CHLORIDE 0.9 % IV SOLN: INTRAVENOUS | Status: DC

## 2021-09-02 MED ORDER — MIDAZOLAM HCL 2 MG/2ML IJ SOLN
INTRAMUSCULAR | Status: AC
  Filled 2021-09-02: qty 2

## 2021-09-02 MED ORDER — MIDAZOLAM HCL 2 MG/2ML IJ SOLN
INTRAMUSCULAR | Status: DC | PRN
  Administered 2021-09-02 (×2): 1 mg via INTRAVENOUS

## 2021-09-02 MED ORDER — AMISULPRIDE (ANTIEMETIC) 5 MG/2ML IV SOLN: 10.0000 mg | Freq: Once | INTRAVENOUS | Status: DC | PRN

## 2021-09-02 MED ORDER — OXYCODONE HCL 5 MG/5ML PO SOLN: 5.0000 mg | Freq: Once | ORAL | Status: DC | PRN

## 2021-09-02 MED ORDER — MUPIROCIN 2 % EX OINT
1.0000 "application " | TOPICAL_OINTMENT | Freq: Two times a day (BID) | CUTANEOUS | Status: DC
  Administered 2021-09-03 – 2021-09-04 (×2): 1 via NASAL
  Filled 2021-09-02: qty 22

## 2021-09-02 MED ORDER — SODIUM CHLORIDE 0.9 % IV SOLN: INTRAVENOUS | Status: DC | PRN

## 2021-09-02 MED ORDER — ACETAMINOPHEN 160 MG/5ML PO SOLN: 325.0000 mg | ORAL | Status: DC | PRN

## 2021-09-02 MED ORDER — CHLORHEXIDINE GLUCONATE 0.12 % MT SOLN
OROMUCOSAL | Status: AC
  Administered 2021-09-02: 15 mL via OROMUCOSAL
  Filled 2021-09-02: qty 15

## 2021-09-02 MED ORDER — FENTANYL CITRATE (PF) 100 MCG/2ML IJ SOLN: 25.0000 ug | INTRAMUSCULAR | Status: DC | PRN

## 2021-09-02 MED ORDER — PROPOFOL 500 MG/50ML IV EMUL
INTRAVENOUS | Status: DC | PRN
  Administered 2021-09-02: 125 ug/kg/min via INTRAVENOUS

## 2021-09-02 MED ORDER — LIDOCAINE 2% (20 MG/ML) 5 ML SYRINGE
INTRAMUSCULAR | Status: AC
  Filled 2021-09-02: qty 5

## 2021-09-02 MED ORDER — CHLORHEXIDINE GLUCONATE 0.12 % MT SOLN: 15.0000 mL | Freq: Once | OROMUCOSAL | Status: AC

## 2021-09-02 MED ORDER — METOCLOPRAMIDE HCL 5 MG/ML IJ SOLN
10.0000 mg | Freq: Four times a day (QID) | INTRAMUSCULAR | Status: DC | PRN
  Administered 2021-09-02: 10 mg via INTRAVENOUS
  Filled 2021-09-02: qty 2

## 2021-09-02 MED ORDER — PROPOFOL 10 MG/ML IV BOLUS
INTRAVENOUS | Status: DC | PRN
  Administered 2021-09-02: 10 mg via INTRAVENOUS

## 2021-09-02 SURGICAL SUPPLY — 34 items
BLADE LONG MED 31X9 (MISCELLANEOUS) IMPLANT
BNDG ELASTIC 4X5.8 VLCR STR LF (GAUZE/BANDAGES/DRESSINGS) ×1 IMPLANT
BNDG GAUZE ELAST 4 BULKY (GAUZE/BANDAGES/DRESSINGS) ×1 IMPLANT
COVER SURGICAL LIGHT HANDLE (MISCELLANEOUS) ×3 IMPLANT
CUFF TOURN SGL QUICK 24 (TOURNIQUET CUFF)
CUFF TRNQT CYL 24X4X16.5-23 (TOURNIQUET CUFF) IMPLANT
DRAPE SURG 17X23 STRL (DRAPES) ×3 IMPLANT
GAUZE PACKING IODOFORM 1/2INX (GAUZE/BANDAGES/DRESSINGS) IMPLANT
GAUZE PACKING IODOFORM 1/2INX5 (GAUZE/BANDAGES/DRESSINGS) ×1
GAUZE SPONGE 2X2 8PLY STRL LF (GAUZE/BANDAGES/DRESSINGS) IMPLANT
GAUZE SPONGE 4X4 12PLY STRL (GAUZE/BANDAGES/DRESSINGS) ×1 IMPLANT
GAUZE XEROFORM 1X8 LF (GAUZE/BANDAGES/DRESSINGS) IMPLANT
GLOVE BIOGEL M 7.0 STRL (GLOVE) ×3 IMPLANT
GLOVE BIOGEL PI ORTHO PRO 7.5 (GLOVE) ×1
GLOVE PI ORTHO PRO STRL 7.5 (GLOVE) ×2 IMPLANT
GOWN STRL REUS W/ TWL LRG LVL3 (GOWN DISPOSABLE) ×4 IMPLANT
GOWN STRL REUS W/TWL LRG LVL3 (GOWN DISPOSABLE) ×4
KIT BASIN OR (CUSTOM PROCEDURE TRAY) ×3 IMPLANT
KIT TURNOVER KIT B (KITS) ×3 IMPLANT
NDL HYPO 25GX1X1/2 BEV (NEEDLE) IMPLANT
NEEDLE HYPO 25GX1X1/2 BEV (NEEDLE) IMPLANT
NS IRRIG 1000ML POUR BTL (IV SOLUTION) ×3 IMPLANT
PACK ORTHO EXTREMITY (CUSTOM PROCEDURE TRAY) ×3 IMPLANT
PAD ARMBOARD 7.5X6 YLW CONV (MISCELLANEOUS) ×6 IMPLANT
SOL PREP POV-IOD 4OZ 10% (MISCELLANEOUS) ×9 IMPLANT
SPECIMEN JAR SMALL (MISCELLANEOUS) ×3 IMPLANT
SPONGE GAUZE 2X2 STER 10/PKG (GAUZE/BANDAGES/DRESSINGS)
SUT ETHILON 3 0 PS 1 (SUTURE) ×3 IMPLANT
SYR CONTROL 10ML LL (SYRINGE) IMPLANT
TOWEL GREEN STERILE (TOWEL DISPOSABLE) ×3 IMPLANT
TOWEL GREEN STERILE FF (TOWEL DISPOSABLE) ×3 IMPLANT
TUBE CONNECTING 12X1/4 (SUCTIONS) IMPLANT
UNDERPAD 30X36 HEAVY ABSORB (UNDERPADS AND DIAPERS) ×3 IMPLANT
WATER STERILE IRR 1000ML POUR (IV SOLUTION) ×3 IMPLANT

## 2021-09-02 NOTE — Anesthesia Preprocedure Evaluation (Addendum)
Anesthesia Evaluation  ?Patient identified by MRN, date of birth, ID band ?Patient awake ? ? ? ?Reviewed: ?Allergy & Precautions, NPO status , Patient's Chart, lab work & pertinent test results ? ?Airway ?Mallampati: I ? ?TM Distance: >3 FB ?Neck ROM: Full ? ? ? Dental ? ?(+) Teeth Intact, Dental Advisory Given ?  ?Pulmonary ?former smoker,  ?  ?breath sounds clear to auscultation ? ? ? ? ? ? Cardiovascular ?hypertension, Pt. on home beta blockers ?+ CAD, + Past MI and +CHF  ? ?Rhythm:Regular Rate:Normal ? ?Echo: ? ??1. The mid to distal anteroseptal wall is akinetic. The apex is akinetic  ?with some portions dyskinetic, there is no signs of apical thrombus with  ?use of echocontrast. The mid to distal inferoseptal wall is akinetic. Marland Kitchen  ?Left ventricular ejection fraction,  ??by estimation, is 35 to 40%. The left ventricle has moderately decreased  ?function. Left ventricular diastolic parameters are consistent with Grade  ?III diastolic dysfunction (restrictive). Elevated left atrial pressure.  ??2. Right ventricular systolic function is normal. The right ventricular  ?size is normal.  ??3. Left atrial size was mildly dilated.  ??4. The mitral valve is normal in structure. Mild mitral valve  ?regurgitation. No evidence of mitral stenosis.  ??5. The inferior vena cava is normal in size with greater than 50%  ?respiratory variability, suggesting right atrial pressure of 3 mmHg.  ??6. Limited echo to evaluate LV function and wall motion. ?  ?Neuro/Psych ?CVA, No Residual Symptoms   ? GI/Hepatic ?Neg liver ROS, GERD  Medicated,  ?Endo/Other  ?diabetes, Type 2, Insulin DependentHypothyroidism  ? Renal/GU ?Renal disease  ? ?  ?Musculoskeletal ?negative musculoskeletal ROS ?(+)  ? Abdominal ?Normal abdominal exam  (+)   ?Peds ? Hematology ?  ?Anesthesia Other Findings ? ? Reproductive/Obstetrics ? ?  ? ? ? ? ? ? ? ? ? ? ? ? ? ?  ?  ? ? ? ? ? ? ? ?Anesthesia Physical ?Anesthesia  Plan ? ?ASA: 3 ? ?Anesthesia Plan: MAC  ? ?Post-op Pain Management:   ? ?Induction: Intravenous ? ?PONV Risk Score and Plan: 3 and Ondansetron, Propofol infusion and Midazolam ? ?Airway Management Planned: Natural Airway and Simple Face Mask ? ?Additional Equipment: None ? ?Intra-op Plan:  ? ?Post-operative Plan:  ? ?Informed Consent:  ? ?Plan Discussed with: CRNA ? ?Anesthesia Plan Comments:   ? ? ? ? ? ? ?Anesthesia Quick Evaluation ? ?

## 2021-09-02 NOTE — Op Note (Signed)
Patient Name: Lauren [name] ?DOB: 1980-10-25  ?MRN: [id number] ?  ?Date of Service: 08/27/2021 - 09/02/2021 ? ?Surgeon: Dr. Lanae Crumbly, DPM ?Assistants: None ?Pre-operative Diagnosis:  ?Osteomyelitis left foot ?Diabetic foot infection ?Post-operative Diagnosis:  ?Osteomyelitis left foot ?Diabetic foot infection ?Procedures: ? 1) amputation toe with metatarsal fifth left foot ? 2) full-thickness excisional debridement open wound 6.0 x 2.0 cm to muscle layer ?Pathology/Specimens: ?ID Type Source Tests Collected by Time Destination  ?1 : left 5th ray Tissue Soft Tissue, Other SURGICAL PATHOLOGY Criselda Peaches, DPM 09/02/2021 1822   ? ?Anesthesia: MAC with local block ?Hemostasis: * Missing tourniquet times found for documented tourniquets in log: [id number] * ?Estimated Blood Loss: 20cc ?Materials: * No implants in log * ?Medications: 17 cc 0.25% Marcaine plain ?Complications: No complications noted ? ?Indications for Procedure:  ?This is a 41 y.o. female with a history of severe diabetic foot infection ulceration of the left foot.  She underwent I&D by my partner 3 days ago, her bone biopsy came back with osteomyelitis.  She returns today for further debridement and resection of the fifth toe and metatarsal ?  ?Procedure in Detail: ?Patient was identified in pre-operative holding area. Formal consent was signed and the left lower extremity was marked. Patient was brought back to the operating room. Anesthesia was induced. The extremity was prepped and draped in the usual sterile fashion. Timeout was taken to confirm patient name, laterality, and procedure prior to incision.  ? ?Attention was then directed to the left foot where the open wound was debrided in a full-thickness excisional manner to the muscle layer.  Total area of debridement was 6.0 x 2.0 cm.  Dissection was carried deep to the level of the fifth metatarsal where periosteal incision was made and an elevator was used to raise the soft tissue's.  A resection  of the distal fifth metatarsal was then performed in standard transmetatarsal amputation fashion.  The incision was carried to circumferential around the toe.  The fifth metatarsal and fifth toe was then sent for pathology.  Further excisional debridement was then carried out.  No new purulence was found.  No abscess was found.  There was some necrotic soft tissue plantar to the fifth metatarsal this was fully debrided.  Following debridement no nonviable tissue remained.  It was then irrigated thoroughly with 3 L normal sterile saline with a pulse irrigator.  Hemostasis was achieved iodinated packing was then placed into the wound and it was loosely reapproximated with 3-0 nylon. ? ?The foot was then dressed with 4 x 4, Kerlix Ace wrap and ABD pad. Patient tolerated the procedure well. ?  ?Disposition: ?Following a period of post-operative monitoring, patient will be transferred to the floor.  She will return to the OR for further debridement and delayed secondary closure at a later date. ? ?

## 2021-09-02 NOTE — Progress Notes (Signed)
?PROGRESS NOTE ? ? ? ?Lauren [name]  HBZ:[id number] DOB: 07/24/1980 DOA: 08/27/2021 ?PCP: Lemmie Evens, MD  ? ?Brief Narrative:  ?41 y.o. female with medical history significant of DM1, HTN, chronic systolic CHF, hypothyroidism, CKD stage IIIa, HLD, class II obesity, CAD, history of CVA presented with progressive worsening of the redness and pain in her left foot.  She was found to have left lower extremity cellulitis along with ulcer with concern for osteomyelitis.  She was started on broad-spectrum antibiotics.  Podiatry was consulted.  She underwent I&D on 08/30/2021.  ID was subsequently consulted and antibiotics switched to Unasyn. ? ?Assessment & Plan: ?  ?Left foot cellulitis/acute osteomyelitis/diabetic ulcer infection ?-ABI showed mild right lower extremity arterial disease.  Left ABI was within normal range ?-Status post I&D by podiatry on 08/30/2021.  Plan for further surgical intervention by podiatry today ?-During the hospitalization, she appeared to have some kind of allergic reaction to cefepime with rash and itching with subsequent switch to meropenem.  OR cultures grew strep agalactiae.  ID consulted on 09/01/2021 and antibiotics have been switched to Unasyn. ? ?Leukocytosis ?-Improving ? 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 ?-Continue Lantus and CBGs with SSI ? ?Hypertension ?Hyperlipidemia ?-Blood pressure stable.  Continue metoprolol and statin with fenofibrate ? ?Hypothyroidism ?-TSH 25.  Levothyroxine dose was increased.  TSH to be rechecked in a few weeks ? ?CKD stage IIIb ?-Baseline creatinine around 2.2-3.  Currently stable.  Monitor ? ?History of stroke ?CAD: Currently stable ?-Plavix on hold due to need for surgical procedure.  Continue statin and metoprolol. ? ?Chronic combined systolic and diastolic CHF ?-Last echo from January 2022 showed EF of 35 to 40% with grade 3 diastolic dysfunction.  Continue metoprolol.  No ACE inhibitor or  ARB or Entresto due to elevated creatinine.  Jardiance on hold. ?-Strict input and output.  Daily weights.  Fluid restriction.  Patient received a dose of IV Lasix on 09/01/2021. ? ?Thrombocytosis ?-Possibly reactive ? ?Obesity ?-Outpatient follow-up ? ? ?DVT prophylaxis: Subcutaneous heparin ?Code Status: Full ?Family Communication: Husband and aunt at bedside ?Disposition Plan: ?Status is: Inpatient ?Remains inpatient appropriate because: Of severity of illness ? ? ? ?Consultants: Podiatry/ID ? ?Procedures: Left foot I&D on 08/30/2021 ? ?Antimicrobials:  ?Anti-infectives (From admission, onward)  ? ? Start     Dose/Rate Route Frequency Ordered Stop  ? 09/01/21 1145  Ampicillin-Sulbactam (UNASYN) 3 g in sodium chloride 0.9 % 100 mL IVPB       ? 3 g ?200 mL/hr over 30 Minutes Intravenous Every 6 hours 09/01/21 1051    ? 08/31/21 2200  meropenem (MERREM) 1 g in sodium chloride 0.9 % 100 mL IVPB  Status:  Discontinued       ? 1 g ?200 mL/hr over 30 Minutes Intravenous Every 12 hours 08/31/21 1340 09/01/21 1051  ? 08/31/21 1000  meropenem (MERREM) 2 g in sodium chloride 0.9 % 100 mL IVPB  Status:  Discontinued       ? 2 g ?280 mL/hr over 30 Minutes Intravenous Every 12 hours 08/31/21 0856 08/31/21 1340  ? 08/29/21 2200  vancomycin (VANCOREADY) IVPB 1500 mg/300 mL  Status:  Discontinued       ? 1,500 mg ?150 mL/hr over 120 Minutes Intravenous Every 48 hours 08/27/21 2320 09/01/21 1051  ? 08/27/21 2330  ceFEPIme (MAXIPIME) 2 g in sodium chloride 0.9 % 100 mL IVPB  Status:  Discontinued       ? 2  g ?200 mL/hr over 30 Minutes Intravenous Every 12 hours 08/27/21 2320 08/31/21 0841  ? 08/27/21 2330  vancomycin (VANCOREADY) IVPB 2000 mg/400 mL       ? 2,000 mg ?200 mL/hr over 120 Minutes Intravenous  Once 08/27/21 2320 08/28/21 0349  ? 08/27/21 2315  metroNIDAZOLE (FLAGYL) IVPB 500 mg  Status:  Discontinued       ? 500 mg ?100 mL/hr over 60 Minutes Intravenous Every 12 hours 08/27/21 2305 08/31/21 0904  ? 08/27/21 2200   vancomycin (VANCOCIN) IVPB 1000 mg/200 mL premix  Status:  Discontinued       ? 1,000 mg ?200 mL/hr over 60 Minutes Intravenous  Once 08/27/21 2152 08/27/21 2320  ? 08/27/21 2200  piperacillin-tazobactam (ZOSYN) IVPB 3.375 g  Status:  Discontinued       ? 3.375 g ?100 mL/hr over 30 Minutes Intravenous  Once 08/27/21 2152 08/27/21 2322  ? ?  ? ? ? ?Subjective: ?Patient seen and examined at bedside.  Complains of nausea.  No fever, worsening shortness of breath or chest pain reported. ? ?Objective: ?Vitals:  ? 09/01/21 1643 09/01/21 2049 09/02/21 0519 09/02/21 0931  ?BP: 118/79 (!) 141/96 (!) 114/57 117/70  ?Pulse: 93 94 84 71  ?Resp: 14 18 18 18   ?Temp: 98.1 ?F (36.7 ?C) 98.5 ?F (36.9 ?C) 97.6 ?F (36.4 ?C) 97.8 ?F (36.6 ?C)  ?TempSrc: Oral Oral Oral Oral  ?SpO2: 99% 99% 98% 96%  ?Weight:  94 kg    ?Height:      ? ? ?Intake/Output Summary (Last 24 hours) at 09/02/2021 1036 ?Last data filed at 09/01/2021 2343 ?Gross per 24 hour  ?Intake 200 ml  ?Output 0 ml  ?Net 200 ml  ? ?Filed Weights  ? 08/28/21 0127 08/30/21 0717 09/01/21 2049  ?Weight: 94.4 kg 94.4 kg 94 kg  ? ? ?Examination: ? ?General exam: Appears calm and comfortable.  Currently on room air.  Looks chronically ill and deconditioned. ?Respiratory system: Bilateral decreased breath sounds at bases ?Cardiovascular system: S1 & S2 heard, Rate controlled ?Gastrointestinal system: Abdomen is obese, nondistended, soft and nontender. Normal bowel sounds heard. ?Extremities: No cyanosis, clubbing; left foot dressing present ?Central nervous system: Alert and oriented. No focal neurological deficits. Moving extremities ?Skin: No obvious ecchymosis/lesions ?Psychiatry: Judgement and insight appear normal. Mood & affect appropriate.  ? ? ? ?Data Reviewed: I have personally reviewed following labs and imaging studies ? ?CBC: ?Recent Labs  ?Lab 08/29/21 ?0256 08/30/21 ?0132 08/31/21 ?0402 09/01/21 ?7253 09/02/21 ?6644  ?WBC 17.2* 17.5* 18.5* 15.7* 14.5*  ?HGB 11.6* 11.2* 11.4*  10.7* 10.8*  ?HCT 35.5* 34.0* 34.2* 32.5* 32.5*  ?MCV 90.8 92.1 92.4 92.1 91.5  ?PLT 402* 403* 398 429* 460*  ? ?Basic Metabolic Panel: ?Recent Labs  ?Lab 08/28/21 ?0001 08/28/21 ?0347 08/29/21 ?4259 08/30/21 ?0132 08/31/21 ?5638 09/01/21 ?7564 09/02/21 ?3329  ?NA  --    < > 135 134* 135 134* 138  ?K  --    < > 3.4* 3.9 3.9 4.0 3.9  ?CL  --    < > 105 105 108 104 106  ?CO2  --    < > 23 21* 19* 21* 25  ?GLUCOSE  --    < > 214* 279* 123* 208* 152*  ?BUN  --    < > 25* 40* 37* 44* 40*  ?CREATININE  --    < > 2.21* 2.27* 2.43* 2.59* 2.59*  ?CALCIUM  --    < > 7.9* 8.1* 7.8* 8.1* 8.1*  ?  MG 2.0  --   --  2.0  --   --   --   ?PHOS 4.2  --   --   --   --   --   --   ? < > = values in this interval not displayed.  ? ?GFR: ?Estimated Creatinine Clearance: 33.1 mL/min (A) (by C-G formula based on SCr of 2.59 mg/dL (H)). ?Liver Function Tests: ?Recent Labs  ?Lab 08/28/21 ?0001 08/28/21 ?1021  ?AST 13* 11*  ?ALT 15 13  ?ALKPHOS 134* 119  ?BILITOT 0.6 0.7  ?PROT 6.0* 5.7*  ?ALBUMIN 2.0* 2.0*  ? ?No results for input(s): LIPASE, AMYLASE in the last 168 hours. ?No results for input(s): AMMONIA in the last 168 hours. ?Coagulation Profile: ?Recent Labs  ?Lab 08/28/21 ?0001  ?INR 1.1  ? ?Cardiac Enzymes: ?Recent Labs  ?Lab 08/28/21 ?0001  ?CKTOTAL 54  ? ?BNP (last 3 results) ?No results for input(s): PROBNP in the last 8760 hours. ?HbA1C: ?No results for input(s): HGBA1C in the last 72 hours. ?CBG: ?Recent Labs  ?Lab 09/01/21 ?1435 09/01/21 ?1645 09/01/21 ?2048 09/01/21 ?2257 09/02/21 ?0604  ?GLUCAP 176* 111* 209* 270* 134*  ? ?Lipid Profile: ?No results for input(s): CHOL, HDL, LDLCALC, TRIG, CHOLHDL, LDLDIRECT in the last 72 hours. ?Thyroid Function Tests: ?No results for input(s): TSH, T4TOTAL, FREET4, T3FREE, THYROIDAB in the last 72 hours. ?Anemia Panel: ?No results for input(s): VITAMINB12, FOLATE, FERRITIN, TIBC, IRON, RETICCTPCT in the last 72 hours. ?Sepsis Labs: ?Recent Labs  ?Lab 08/27/21 ?2018 08/28/21 ?0001  ?PROCALCITON   --  0.55  ?LATICACIDVEN 1.1  --   ? ? ?Recent Results (from the past 240 hour(s))  ?Culture, blood (x 2)     Status: None (Preliminary result)  ? Collection Time: 08/27/21 10:53 PM  ? Specimen: BLOOD  ?Re

## 2021-09-02 NOTE — Transfer of Care (Signed)
Immediate Anesthesia Transfer of Care Note ? ?Patient: Lauren [name] ? ?Procedure(s) Performed: AMPUTATION RAY (Left) ? ?Patient Location: PACU ? ?Anesthesia Type:MAC ? ?Level of Consciousness: awake, alert  and oriented ? ?Airway & Oxygen Therapy: Patient Spontanous Breathing ? ?Post-op Assessment: Report given to RN and Post -op Vital signs reviewed and stable ? ?Post vital signs: Reviewed and stable ? ?Last Vitals:  ?Vitals Value Taken Time  ?BP 128/84 09/02/21 1857  ?Temp    ?Pulse 94 09/02/21 1900  ?Resp 15 09/02/21 1900  ?SpO2 96 % 09/02/21 1900  ?Vitals shown include unvalidated device data. ? ?Last Pain:  ?Vitals:  ? 09/02/21 1635  ?TempSrc: Oral  ?PainSc: 0-No pain  ?   ? ?Patients Stated Pain Goal: 0 (09/01/21 0347) ? ?Complications: No notable events documented. ?

## 2021-09-02 NOTE — Progress Notes (Signed)
History and Physical Interval Note: ? ?09/02/2021 ?5:06 PM ? ?Lauren [name]  has presented today for surgery, with the diagnosis of osteomyelitis left foot.  The various methods of treatment have been discussed with the patient and family. After consideration of risks, benefits and other options for treatment, the patient has consented to   ?Procedure(s) with comments: ?AMPUTATION RAY (Left) as a surgical intervention.  The patient's history has been reviewed, patient examined, no change in status, stable for surgery.  I have reviewed the patient's chart and labs.  Questions were answered to the patient's satisfaction.   ? ? ?Stephan Minister Swayze Kozuch ? ? ?

## 2021-09-02 NOTE — TOC Progression Note (Signed)
Transition of Care (TOC) - Progression Note  ? ? ?Patient Details  ?Name: Lauren [name] ?MRN: [id number] ?Date of Birth: November 03, 1980 ? ?Transition of Care (TOC) CM/SW Contact  ?Tom-Johnson, Renea Ee, RN ?Phone Number: ?09/02/2021, 12:40 PM ? ?Clinical Narrative:    ? ?Patient is scheduled for amputation of the fifth metatarsal head and the toe in OR today. Continues on IV abx. CM will continue to follow with needs. ? ? ?Expected Discharge Plan: Home/Self Care ?Barriers to Discharge: Continued Medical Work up ? ?Expected Discharge Plan and Services ?Expected Discharge Plan: Home/Self Care ?  ?Discharge Planning Services: CM Consult ?Post Acute Care Choice: NA ?Living arrangements for the past 2 months: [place] ?                ?DME Arranged: N/A ?DME Agency: NA ?  ?  ?  ?  ?[place] Agency: NA ?  ?  ?  ? ? ?Social Determinants of Health (SDOH) Interventions ?  ? ?Readmission Risk Interventions ?   ? View : No data to display.  ?  ?  ?  ? ? ?

## 2021-09-02 NOTE — Anesthesia Procedure Notes (Signed)
Procedure Name: [place] ?Date/Time: 09/02/2021 6:05 PM ?Performed by: Eligha Bridegroom, CRNA ?Pre-anesthesia Checklist: Patient identified, Emergency Drugs available, Suction available, Patient being monitored and Timeout performed ?Patient Re-evaluated:Patient Re-evaluated prior to induction ?Oxygen Delivery Method: Nasal cannula ?Preoxygenation: Pre-oxygenation with 100% oxygen ?Induction Type: IV induction ? ? ? ? ?

## 2021-09-02 NOTE — Progress Notes (Signed)
Hypoglycemic Event ? ?CBG: 63 ? ?Treatment: D50 25 mL (12.5 gm) ? ?Symptoms: None ? ?Follow-up CBG: Time: 1225 CBG Result: 138 ? ?Possible Reasons for Event: Other: NPO for surgery ? ?Comments/MD notified: Aline August MD ? ? ? ?Manuella Ghazi ? ? ?

## 2021-09-02 NOTE — Brief Op Note (Signed)
09/02/2021 ? ?6:42 PM ? ?PATIENT:  Lauren [name]  41 y.o. female ? ?PRE-OPERATIVE DIAGNOSIS:  diabetic foot infection ? ?POST-OPERATIVE DIAGNOSIS:  diabetic foot infection ? ?PROCEDURE:  Procedure(s) with comments: ?AMPUTATION RAY (Left) - sagittal saw, 3L bag saline & Pulse ? ?SURGEON:  Surgeon(s) and Role: ?   * Babe Anthis, Stephan Minister, DPM - Primary ? ? ? ?ASSISTANTS: none  ? ?ANESTHESIA:   none ? ?EBL:  < 20cc  ? ?BLOOD ADMINISTERED:none ? ?DRAINS: none  ? ?LOCAL MEDICATIONS USED:  MARCAINE    and Amount: 17 ml ? ?SPECIMEN:  5th metatarsal andtoe ? ?DISPOSITION OF SPECIMEN:  PATHOLOGY ? ?COUNTS:  YES ? ?TOURNIQUET:  * Missing tourniquet times found for documented tourniquets in log: [id number] * ? ?DICTATION: .Note written in EPIC ? ?PLAN OF CARE: Admit to inpatient  ? ?PATIENT DISPOSITION:  PACU - hemodynamically stable. ?  ?Delay start of Pharmacological VTE agent (>24hrs) due to surgical blood loss or risk of bleeding: no ? ?

## 2021-09-02 NOTE — Anesthesia Postprocedure Evaluation (Signed)
Anesthesia Post Note ? ?Patient: Lauren [name] ? ?Procedure(s) Performed: AMPUTATION RAY (Left) ? ?  ? ?Patient location during evaluation: PACU ?Anesthesia Type: MAC ?Level of consciousness: awake and alert ?Pain management: pain level controlled ?Vital Signs Assessment: post-procedure vital signs reviewed and stable ?Respiratory status: spontaneous breathing, nonlabored ventilation, respiratory function stable and patient connected to nasal cannula oxygen ?Cardiovascular status: stable and blood pressure returned to baseline ?Postop Assessment: no apparent nausea or vomiting ?Anesthetic complications: no ? ? ?No notable events documented. ? ?Last Vitals:  ?Vitals:  ? 09/02/21 1915 09/02/21 1930  ?BP: (!) 128/91 (!) 147/94  ?Pulse: 96 96  ?Resp: 12 13  ?Temp:  36.6 ?C  ?SpO2: 97% 99%  ?  ?Last Pain:  ?Vitals:  ? 09/02/21 1930  ?TempSrc:   ?PainSc: 0-No pain  ? ? ?  ?  ?  ?  ?  ?  ? ?Veronica Fretz ? ? ? ? ?

## 2021-09-03 ENCOUNTER — Other Ambulatory Visit: Payer: Self-pay

## 2021-09-03 ENCOUNTER — Encounter (HOSPITAL_COMMUNITY): Payer: Self-pay | Admitting: Podiatry

## 2021-09-03 DIAGNOSIS — E1069 Type 1 diabetes mellitus with other specified complication: Secondary | ICD-10-CM

## 2021-09-03 DIAGNOSIS — M86172 Other acute osteomyelitis, left ankle and foot: Secondary | ICD-10-CM

## 2021-09-03 LAB — CULTURE, BLOOD (ROUTINE X 2)
Culture: NO GROWTH
Special Requests: ADEQUATE

## 2021-09-03 LAB — CBC WITH DIFFERENTIAL/PLATELET
Abs Immature Granulocytes: 0.31 10*3/uL — ABNORMAL HIGH (ref 0.00–0.07)
Basophils Absolute: 0.1 10*3/uL (ref 0.0–0.1)
Basophils Relative: 0 %
Eosinophils Absolute: 0.4 10*3/uL (ref 0.0–0.5)
Eosinophils Relative: 3 %
HCT: 34.2 % — ABNORMAL LOW (ref 36.0–46.0)
Hemoglobin: 11.1 g/dL — ABNORMAL LOW (ref 12.0–15.0)
Immature Granulocytes: 2 %
Lymphocytes Relative: 12 %
Lymphs Abs: 2 10*3/uL (ref 0.7–4.0)
MCH: 29.8 pg (ref 26.0–34.0)
MCHC: 32.5 g/dL (ref 30.0–36.0)
MCV: 91.7 fL (ref 80.0–100.0)
Monocytes Absolute: 1.1 10*3/uL — ABNORMAL HIGH (ref 0.1–1.0)
Monocytes Relative: 7 %
Neutro Abs: 12.8 10*3/uL — ABNORMAL HIGH (ref 1.7–7.7)
Neutrophils Relative %: 76 %
Platelets: 508 10*3/uL — ABNORMAL HIGH (ref 150–400)
RBC: 3.73 MIL/uL — ABNORMAL LOW (ref 3.87–5.11)
RDW: 12.9 % (ref 11.5–15.5)
WBC: 16.7 10*3/uL — ABNORMAL HIGH (ref 4.0–10.5)
nRBC: 0 % (ref 0.0–0.2)

## 2021-09-03 LAB — BASIC METABOLIC PANEL
Anion gap: 8 (ref 5–15)
BUN: 31 mg/dL — ABNORMAL HIGH (ref 6–20)
CO2: 25 mmol/L (ref 22–32)
Calcium: 8.1 mg/dL — ABNORMAL LOW (ref 8.9–10.3)
Chloride: 108 mmol/L (ref 98–111)
Creatinine, Ser: 2.36 mg/dL — ABNORMAL HIGH (ref 0.44–1.00)
GFR, Estimated: 26 mL/min — ABNORMAL LOW (ref >60)
Glucose, Bld: 168 mg/dL — ABNORMAL HIGH (ref 70–99)
Potassium: 4.1 mmol/L (ref 3.5–5.1)
Sodium: 141 mmol/L (ref 135–145)

## 2021-09-03 LAB — GLUCOSE, CAPILLARY
Glucose-Capillary: 153 mg/dL — ABNORMAL HIGH (ref 70–99)
Glucose-Capillary: 142 mg/dL — ABNORMAL HIGH (ref 70–99)
Glucose-Capillary: 83 mg/dL (ref 70–99)
Glucose-Capillary: 93 mg/dL (ref 70–99)

## 2021-09-03 LAB — MAGNESIUM: Magnesium: 1.9 mg/dL (ref 1.7–2.4)

## 2021-09-03 MED ORDER — CHLORHEXIDINE GLUCONATE CLOTH 2 % EX PADS
6.0000 | MEDICATED_PAD | Freq: Every day | CUTANEOUS | Status: DC
  Administered 2021-09-03 – 2021-09-05 (×3): 6 via TOPICAL

## 2021-09-03 MED ORDER — SODIUM CHLORIDE 0.9% FLUSH: 10.0000 mL | INTRAVENOUS | Status: DC | PRN

## 2021-09-03 MED ORDER — SODIUM CHLORIDE 0.9 % IV SOLN: INTRAVENOUS | Status: DC | PRN

## 2021-09-03 MED ORDER — SODIUM CHLORIDE 0.9% FLUSH
10.0000 mL | Freq: Two times a day (BID) | INTRAVENOUS | Status: DC
  Administered 2021-09-04 – 2021-09-05 (×2): 10 mL

## 2021-09-03 NOTE — Progress Notes (Signed)
?  Lauren [name] for Infectious Disease ? ? ?Reason for visit: Follow up on diabetic foot infection ? ?Interval History: s/p left fifth toe amputation; WBC 16.7, no fever.  No associated rash or diarrhea.  ?Day 8 total antibiotics ? ?Physical Exam: ?Constitutional:  ?Vitals:  ? 09/03/21 0446 09/03/21 0905  ?BP: 120/61 119/64  ?Pulse: 95 92  ?Resp: 16 17  ?Temp: (!) 97.3 ?F (36.3 ?C) 98.5 ?F (36.9 ?C)  ?SpO2: 96% 97%  ? patient appears in NAD ?Respiratory: Normal respiratory effort; CTA B ?Cardiovascular: RRR ?MS: foot wrapped ? ?Review of Systems: ?Constitutional: negative for fevers and chills ?Gastrointestinal: negative for diarrhea ? ?Lab Results  ?Component Value Date  ? WBC 16.7 (H) 09/03/2021  ? HGB 11.1 (L) 09/03/2021  ? HCT 34.2 (L) 09/03/2021  ? MCV 91.7 09/03/2021  ? PLT 508 (H) 09/03/2021  ?  ?Lab Results  ?Component Value Date  ? CREATININE 2.36 (H) 09/03/2021  ? BUN 31 (H) 09/03/2021  ? NA 141 09/03/2021  ? K 4.1 09/03/2021  ? CL 108 09/03/2021  ? CO2 25 09/03/2021  ?  ?Lab Results  ?Component Value Date  ? ALT 13 08/28/2021  ? AST 11 (L) 08/28/2021  ? ALKPHOS 119 08/28/2021  ?  ? ?Microbiology: ?Recent Results (from the past 240 hour(s))  ?Culture, blood (x 2)     Status: None  ? Collection Time: 08/27/21 10:53 PM  ? Specimen: BLOOD  ?Result Value Ref Range Status  ? Specimen Description BLOOD LEFT ANTECUBITAL  Final  ? Special Requests   Final  ?  BOTTLES DRAWN AEROBIC AND ANAEROBIC Blood Culture adequate volume  ? Culture   Final  ?  NO GROWTH 7 DAYS ?Performed at [facility], [place] [address] [place], [place] [address] ?  ? Report Status 09/03/2021 FINAL  Final  ?Urine Culture     Status: Abnormal  ? Collection Time: 08/28/21  1:40 AM  ? Specimen: Urine, Clean Catch  ?Result Value Ref Range Status  ? Specimen Description URINE, CLEAN CATCH  Final  ? Special Requests   Final  ?  NONE ?Performed at [facility], [place] [address], [place], [place] [address] ?  ? Culture MULTIPLE SPECIES  PRESENT, SUGGEST RECOLLECTION (A)  Final  ? Report Status 08/29/2021 FINAL  Final  ?MRSA Next Gen by PCR, Nasal     Status: None  ? Collection Time: 08/28/21  1:40 AM  ? Specimen: Nasal Mucosa; Nasal Swab  ?Result Value Ref Range Status  ? MRSA by PCR Next Gen NOT DETECTED NOT DETECTED Final  ?  Comment: (NOTE) ?The GeneXpert MRSA Assay (FDA approved for NASAL specimens only), ?is one component of a comprehensive MRSA colonization surveillance ?program. It is not intended to diagnose MRSA infection nor to guide ?or monitor treatment for MRSA infections. ?Test performance is not FDA approved in patients less than 2 years ?old. ?Performed at [facility], [place] [address] [place], [place] ?[address] ?  ?Aerobic/Anaerobic Culture w Gram Stain (surgical/deep wound)     Status: None (Preliminary result)  ? Collection Time: 08/30/21  7:57 AM  ? Specimen: Wound  ?Result Value Ref Range Status  ? Specimen Description WOUND  Final  ? Special Requests LEFT FOOT WOUND SPEC A  Final  ? Gram Stain   Final  ?  NO WBC SEEN ?RARE GRAM POSITIVE COCCI IN PAIRS ?Performed at [facility], [place] [address], [place], [place] [address] ?  ?  Culture   Final  ?  FEW GROUP B STREP(S.AGALACTIAE)ISOLATED ?TESTING AGAINST S. AGALACTIAE NOT ROUTINELY PERFORMED DUE TO PREDICTABILITY OF AMP/PEN/VAN SUSCEPTIBILITY. ?NO ANAEROBES ISOLATED; CULTURE IN PROGRESS FOR 5 DAYS ?  ? Report Status PENDING  Incomplete  ?Aerobic/Anaerobic Culture w Gram Stain (surgical/deep wound)     Status: None (Preliminary result)  ? Collection Time: 08/30/21  8:05 AM  ? Specimen: Wound  ?Result Value Ref Range Status  ? Specimen Description WOUND  Final  ? Special Requests LEFT FOOT WOUND CLEAN SPEC B  Final  ? Gram Stain   Final  ?  NO WBC SEEN ?RARE GRAM POSITIVE COCCI IN PAIRS ?Performed at [facility], [place] [address] [place], [place] [address] ?  ? Culture   Final  ?  FEW GROUP B STREP(S.AGALACTIAE)ISOLATED ?TESTING AGAINST S. AGALACTIAE NOT  ROUTINELY PERFORMED DUE TO PREDICTABILITY OF AMP/PEN/VAN SUSCEPTIBILITY. ?NO ANAEROBES ISOLATED; CULTURE IN PROGRESS FOR 5 DAYS ?  ? Report Status PENDING  Incomplete  ?Aerobic/Anaerobic Culture w Gram Stain (surgical/deep wound)     Status: None (Preliminary result)  ? Collection Time: 08/30/21  8:11 AM  ? Specimen: PATH Bone biopsy; Tissue  ?Result Value Ref Range Status  ? Specimen Description TISSUE  Final  ? Special Requests LEFT FOOT SPEC C  Final  ? Gram Stain   Final  ?  NO WBC SEEN ?NO ORGANISMS SEEN ?Performed at [facility], [place] [address] [place],  [address] ?  ? Culture   Final  ?  RARE GROUP B STREP(S.AGALACTIAE)ISOLATED ?TESTING AGAINST S. AGALACTIAE NOT ROUTINELY PERFORMED DUE TO PREDICTABILITY OF AMP/PEN/VAN SUSCEPTIBILITY. ?NO ANAEROBES ISOLATED; CULTURE IN PROGRESS FOR 5 DAYS ?  ? Report Status PENDING  Incomplete  ?Surgical PCR screen     Status: None  ? Collection Time: 09/02/21  5:46 AM  ? Specimen: Nasal Mucosa; Nasal Swab  ?Result Value Ref Range Status  ? MRSA, PCR NEGATIVE NEGATIVE Final  ? Staphylococcus aureus NEGATIVE NEGATIVE Final  ?  Comment: (NOTE) ?The Xpert SA Assay (FDA approved for NASAL specimens in patients 5 ?years of age and older), is one component of a comprehensive ?surveillance program. It is not intended to diagnose infection nor to ?guide or monitor treatment. ?Performed at [facility], [place] [address] [place], [place] ?[address] ?  ? ? ?Impression/Plan:  ?1. Left fifth metatarsal osteomyelitis and abscess.  Now s/p amputation by [name].  Cultures noted and growing group B Streptococcus.  No further pus noted during the surgical procedure, only some necrotic soft tissue.   ?Continue with ampicillin/sulbactam for another 24 hours.  She will not need prolonged antibiotics.   ? ?2.  Type 1 diabetes - last Hgb A1c 9.8.  needs continued efforts to improve her sugar control.   ? ? 3.  Chronic kidney disease - currently creat is about at her  baseline.  No antibiotic dose adjustments indicated.  ? ? ?

## 2021-09-03 NOTE — Progress Notes (Signed)
?PROGRESS NOTE ? ? ? ?Lauren [name]  YDX:[id number] DOB: Oct 01, 1980 DOA: 08/27/2021 ?PCP: Lemmie Evens, MD  ? ?Brief Narrative:  ?41 y.o. female with medical history significant of DM1, HTN, chronic systolic CHF, hypothyroidism, CKD stage IIIa, HLD, class II obesity, CAD, history of CVA presented with progressive worsening of the redness and pain in her left foot.  She was found to have left lower extremity cellulitis along with ulcer with concern for osteomyelitis.  She was started on broad-spectrum antibiotics.  Podiatry was consulted.  She underwent I&D on 08/30/2021.  ID was subsequently consulted and antibiotics switched to Unasyn.  She underwent fifth toe/metatarsal amputation on 09/02/2021. ? ?Assessment & Plan: ?  ?Left foot cellulitis/acute osteomyelitis/diabetic ulcer infection ?-ABI showed mild right lower extremity arterial disease.  Left ABI was within normal range ?-Status post I&D by podiatry on 08/30/2021.   ?-During the hospitalization, she appeared to have some kind of allergic reaction to cefepime with rash and itching with subsequent switch to meropenem.  OR cultures grew strep agalactiae.  ID consulted on 09/01/2021 and antibiotics have been switched to Unasyn. ?-Status post fifth toe/metatarsal amputation on 09/02/2021.  Wound care as per podiatry recommendations. ? ?Leukocytosis ?-Worse today.  Monitor. ? 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 ?-Continue Lantus and CBGs with SSI ? ?Hypertension ?Hyperlipidemia ?-Blood pressure stable.  Continue metoprolol and statin with fenofibrate ? ?Hypothyroidism ?-TSH 25.  Levothyroxine dose was increased.  TSH to be rechecked in a few weeks ? ?CKD stage IIIb ?-Baseline creatinine around 2.2-3.  Currently stable.  Monitor ? ?History of stroke ?CAD: Currently stable ?-Plavix on hold due to need for surgical procedure.  Continue statin and metoprolol. ? ?Chronic combined systolic and diastolic CHF ?-Last  echo from January 2022 showed EF of 35 to 40% with grade 3 diastolic dysfunction.  Continue metoprolol.  No ACE inhibitor or ARB or Entresto due to elevated creatinine.  Jardiance on hold. ?-Strict input and output.  Daily weights.  Fluid restriction.  Patient received a dose of IV Lasix on 09/01/2021. ? ?Anemia of chronic disease ?-Possible from renal failure.  Hemoglobin stable.  Monitor intermittently ? ?Thrombocytosis ?-Possibly reactive ? ?Obesity ?-Outpatient follow-up ? ? ?DVT prophylaxis: Subcutaneous heparin ?Code Status: Full ?Family Communication: Husband at bedside ?Disposition Plan: ?Status is: Inpatient ?Remains inpatient appropriate because: Of severity of illness ? ? ? ?Consultants: Podiatry/ID ? 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 ?Antimicrobials:  ?Anti-infectives (From admission, onward)  ? ? Start     Dose/Rate Route Frequency Ordered Stop  ? 09/01/21 1145  Ampicillin-Sulbactam (UNASYN) 3 g in sodium chloride 0.9 % 100 mL IVPB       ? 3 g ?200 mL/hr over 30 Minutes Intravenous Every 6 hours 09/01/21 1051    ? 08/31/21 2200  meropenem (MERREM) 1 g in sodium chloride 0.9 % 100 mL IVPB  Status:  Discontinued       ? 1 g ?200 mL/hr over 30 Minutes Intravenous Every 12 hours 08/31/21 1340 09/01/21 1051  ? 08/31/21 1000  meropenem (MERREM) 2 g in sodium chloride 0.9 % 100 mL IVPB  Status:  Discontinued       ? 2 g ?280 mL/hr over 30 Minutes Intravenous Every 12 hours 08/31/21 0856 08/31/21 1340  ? 08/29/21 2200  vancomycin (VANCOREADY) IVPB 1500 mg/300 mL  Status:  Discontinued       ? 1,500 mg ?150 mL/hr over 120 Minutes Intravenous  Every 48 hours 08/27/21 2320 09/01/21 1051  ? 08/27/21 2330  ceFEPIme (MAXIPIME) 2 g in sodium chloride 0.9 % 100 mL IVPB  Status:  Discontinued       ? 2 g ?200 mL/hr over 30 Minutes Intravenous Every 12 hours 08/27/21 2320 08/31/21 0841  ? 08/27/21 2330  vancomycin (VANCOREADY) IVPB 2000 mg/400 mL       ? 2,000 mg ?200  mL/hr over 120 Minutes Intravenous  Once 08/27/21 2320 08/28/21 0349  ? 08/27/21 2315  metroNIDAZOLE (FLAGYL) IVPB 500 mg  Status:  Discontinued       ? 500 mg ?100 mL/hr over 60 Minutes Intravenous Every 12 hours 08/27/21 2305 08/31/21 0904  ? 08/27/21 2200  vancomycin (VANCOCIN) IVPB 1000 mg/200 mL premix  Status:  Discontinued       ? 1,000 mg ?200 mL/hr over 60 Minutes Intravenous  Once 08/27/21 2152 08/27/21 2320  ? 08/27/21 2200  piperacillin-tazobactam (ZOSYN) IVPB 3.375 g  Status:  Discontinued       ? 3.375 g ?100 mL/hr over 30 Minutes Intravenous  Once 08/27/21 2152 08/27/21 2322  ? ?  ? ? ? ?Subjective: ?Patient seen and examined at bedside.  Complains of intermittent lower extremity pain.  No fever, worsening abdominal pain, diarrhea reported.  Still has intermittent nausea ? ?Objective: ?Vitals:  ? 09/02/21 1900 09/02/21 1915 09/02/21 1930 09/03/21 0446  ?BP:  (!) 128/91 (!) 147/94 120/61  ?Pulse: 94 96 96 95  ?Resp: 12 12 13 16   ?Temp:   97.8 ?F (36.6 ?C) (!) 97.3 ?F (36.3 ?C)  ?TempSrc:    Oral  ?SpO2: 96% 97% 99% 96%  ?Weight:      ?Height:      ? ? ?Intake/Output Summary (Last 24 hours) at 09/03/2021 0718 ?Last data filed at 09/03/2021 0446 ?Gross per 24 hour  ?Intake 1130.17 ml  ?Output 0 ml  ?Net 1130.17 ml  ? ? ?Filed Weights  ? 08/28/21 0127 08/30/21 0717 09/01/21 2049  ?Weight: 94.4 kg 94.4 kg 94 kg  ? ? ?Examination: ? ?General: On room air.  No distress ?ENT/neck: No thyromegaly.  JVD is not elevated  ?respiratory: Decreased breath sounds at bases bilaterally with some crackles; no wheezing  ?CVS: Currently rate controlled; S1-S2 heard  ?abdominal: Soft, obese, nontender, slightly distended; no organomegaly, normal bowel sounds are heard ?Extremities: Trace lower extremity edema; no cyanosis.  Left foot dressing present ?CNS: Awake and alert.  No focal neurologic deficit.  Moves extremities ?Lymph: No obvious lymphadenopathy ?Skin: No obvious ecchymosis/lesions  ?psych: Affect, judgment and mood  are normal  ?musculoskeletal: No obvious other joint swelling/deformity ? ? ? ? ?Data Reviewed: I have personally reviewed following labs and imaging studies ? ?CBC: ?Recent Labs  ?Lab 08/30/21 ?0132 08/31/21 ?0402 09/01/21 ?0409 09/02/21 ?0455 09/03/21 ?0336  ?WBC 17.5* 18.5* 15.7* 14.5* 16.7*  ?NEUTROABS  --   --   --   --  12.8*  ?HGB 11.2* 11.4* 10.7* 10.8* 11.1*  ?HCT 34.0* 34.2* 32.5* 32.5* 34.2*  ?MCV 92.1 92.4 92.1 91.5 91.7  ?PLT 403* 398 429* 460* 508*  ? ? ?Basic Metabolic Panel: ?Recent Labs  ?Lab 08/28/21 ?0001 08/28/21 ?8242 08/30/21 ?0132 08/31/21 ?0402 09/01/21 ?3536 09/02/21 ?1443 09/03/21 ?0336  ?NA  --    < > 134* 135 134* 138 141  ?K  --    < > 3.9 3.9 4.0 3.9 4.1  ?CL  --    < > 105 108 104 106 108  ?CO2  --    < >  21* 19* 21* 25 25  ?GLUCOSE  --    < > 279* 123* 208* 152* 168*  ?BUN  --    < > 40* 37* 44* 40* 31*  ?CREATININE  --    < > 2.27* 2.43* 2.59* 2.59* 2.36*  ?CALCIUM  --    < > 8.1* 7.8* 8.1* 8.1* 8.1*  ?MG 2.0  --  2.0  --   --   --  1.9  ?PHOS 4.2  --   --   --   --   --   --   ? < > = values in this interval not displayed.  ? ? ?GFR: ?Estimated Creatinine Clearance: 36.3 mL/min (A) (by C-G formula based on SCr of 2.36 mg/dL (H)). ?Liver Function Tests: ?Recent Labs  ?Lab 08/28/21 ?0001 08/28/21 ?9211  ?AST 13* 11*  ?ALT 15 13  ?ALKPHOS 134* 119  ?BILITOT 0.6 0.7  ?PROT 6.0* 5.7*  ?ALBUMIN 2.0* 2.0*  ? ? ?No results for input(s): LIPASE, AMYLASE in the last 168 hours. ?No results for input(s): AMMONIA in the last 168 hours. ?Coagulation Profile: ?Recent Labs  ?Lab 08/28/21 ?0001  ?INR 1.1  ? ? ?Cardiac Enzymes: ?Recent Labs  ?Lab 08/28/21 ?0001  ?CKTOTAL 54  ? ? ?BNP (last 3 results) ?No results for input(s): PROBNP in the last 8760 hours. ?HbA1C: ?No results for input(s): HGBA1C in the last 72 hours. ?CBG: ?Recent Labs  ?Lab 09/02/21 ?1743 09/02/21 ?1846 09/02/21 ?1910 09/02/21 ?1957 09/02/21 ?2227  ?GLUCAP 77 66* 111* 73 199*  ? ? ?Lipid Profile: ?No results for input(s): CHOL,  HDL, LDLCALC, TRIG, CHOLHDL, LDLDIRECT in the last 72 hours. ?Thyroid Function Tests: ?No results for input(s): TSH, T4TOTAL, FREET4, T3FREE, THYROIDAB in the last 72 hours. ?Anemia Panel: ?No results for inpu

## 2021-09-03 NOTE — Progress Notes (Signed)
?  Subjective:  ?Patient ID: Lauren [name], female    DOB: 1980/12/15,  MRN: [id number] ? ?Patient seen in room resting comfortably in bed  ? ?Negative for chest pain and shortness of breath ?Fever: no ?Night sweats: no ?Constitutional signs: no ?Objective:  ? ?Vitals:  ? 09/03/21 1657 09/03/21 2041  ?BP: (!) 151/88 (!) 142/81  ?Pulse: 81 98  ?Resp: 18 18  ?Temp: 98.1 ?F (36.7 ?C) 98 ?F (36.7 ?C)  ?SpO2: 98% 95%  ? ?General AA&O x3. Normal mood and affect.  ?Vascular Dorsalis pedis and posterior tibial pulses 2/4 bilat. ?Brisk capillary refill to all digits. Pedal hair present.  ?Neurologic Epicritic sensation grossly absent.  ?Dermatologic Open surgical wound with partial closure, granular wound bed, no new necrosis or purulence  ?Orthopedic: MMT 5/5 in dorsiflexion, plantarflexion, inversion, and eversion. ?Normal joint ROM without pain or crepitus.  ? ? ?Assessment & Plan:  ?Patient was evaluated and treated and all questions answered. ? ?Left foot diabetic foot infection ?-Dressing changed.  No gross purulence found and fairly pleased with the appearance.  Hopefully we can close tomorrow and plan for discharge ?-N.p.o. past midnight ?-Surgery  tomorrow afternoon ?-WBAT in postop shoe ?-Okay by me to leave the floor to go to cafeteria lobby [employer] etc. ? ?Criselda Peaches, DPM ? ?Accessible via secure chat for questions or concerns. ? ?

## 2021-09-04 ENCOUNTER — Inpatient Hospital Stay (HOSPITAL_COMMUNITY): Payer: No Typology Code available for payment source | Admitting: Anesthesiology

## 2021-09-04 ENCOUNTER — Encounter (HOSPITAL_COMMUNITY): Payer: Self-pay | Admitting: Internal Medicine

## 2021-09-04 ENCOUNTER — Other Ambulatory Visit: Payer: Self-pay

## 2021-09-04 ENCOUNTER — Encounter (HOSPITAL_COMMUNITY)
Admission: EM | Disposition: A | Discharge status: Home / Self Care | Payer: Self-pay | Source: Home / Self Care | Attending: Internal Medicine

## 2021-09-04 DIAGNOSIS — S91302A Unspecified open wound, left foot, initial encounter: Secondary | ICD-10-CM

## 2021-09-04 DIAGNOSIS — E11628 Type 2 diabetes mellitus with other skin complications: Secondary | ICD-10-CM

## 2021-09-04 DIAGNOSIS — Z794 Long term (current) use of insulin: Secondary | ICD-10-CM

## 2021-09-04 DIAGNOSIS — L089 Local infection of the skin and subcutaneous tissue, unspecified: Secondary | ICD-10-CM

## 2021-09-04 DIAGNOSIS — L97523 Non-pressure chronic ulcer of other part of left foot with necrosis of muscle: Secondary | ICD-10-CM

## 2021-09-04 HISTORY — PX: IRRIGATION AND DEBRIDEMENT FOOT: SHX6602

## 2021-09-04 LAB — AEROBIC/ANAEROBIC CULTURE W GRAM STAIN (SURGICAL/DEEP WOUND)
Gram Stain: NONE SEEN
Gram Stain: NONE SEEN

## 2021-09-04 LAB — BASIC METABOLIC PANEL
Anion gap: 4 — ABNORMAL LOW (ref 5–15)
BUN: 26 mg/dL — ABNORMAL HIGH (ref 6–20)
CO2: 25 mmol/L (ref 22–32)
Calcium: 7.9 mg/dL — ABNORMAL LOW (ref 8.9–10.3)
Chloride: 109 mmol/L (ref 98–111)
Creatinine, Ser: 2.22 mg/dL — ABNORMAL HIGH (ref 0.44–1.00)
GFR, Estimated: 28 mL/min — ABNORMAL LOW (ref >60)
Glucose, Bld: 200 mg/dL — ABNORMAL HIGH (ref 70–99)
Potassium: 3.9 mmol/L (ref 3.5–5.1)
Sodium: 138 mmol/L (ref 135–145)

## 2021-09-04 LAB — GLUCOSE, CAPILLARY
Glucose-Capillary: 149 mg/dL — ABNORMAL HIGH (ref 70–99)
Glucose-Capillary: 89 mg/dL (ref 70–99)
Glucose-Capillary: 76 mg/dL (ref 70–99)
Glucose-Capillary: 74 mg/dL (ref 70–99)
Glucose-Capillary: 62 mg/dL — ABNORMAL LOW (ref 70–99)
Glucose-Capillary: 142 mg/dL — ABNORMAL HIGH (ref 70–99)
Glucose-Capillary: 387 mg/dL — ABNORMAL HIGH (ref 70–99)

## 2021-09-04 LAB — CBC WITH DIFFERENTIAL/PLATELET
Abs Immature Granulocytes: 0.19 10*3/uL — ABNORMAL HIGH (ref 0.00–0.07)
Basophils Absolute: 0 10*3/uL (ref 0.0–0.1)
Basophils Relative: 0 %
Eosinophils Absolute: 0.3 10*3/uL (ref 0.0–0.5)
Eosinophils Relative: 2 %
HCT: 30.3 % — ABNORMAL LOW (ref 36.0–46.0)
Hemoglobin: 9.9 g/dL — ABNORMAL LOW (ref 12.0–15.0)
Immature Granulocytes: 1 %
Lymphocytes Relative: 21 %
Lymphs Abs: 2.8 10*3/uL (ref 0.7–4.0)
MCH: 30.4 pg (ref 26.0–34.0)
MCHC: 32.7 g/dL (ref 30.0–36.0)
MCV: 92.9 fL (ref 80.0–100.0)
Monocytes Absolute: 0.9 10*3/uL (ref 0.1–1.0)
Monocytes Relative: 6 %
Neutro Abs: 9.5 10*3/uL — ABNORMAL HIGH (ref 1.7–7.7)
Neutrophils Relative %: 70 %
Platelets: 447 10*3/uL — ABNORMAL HIGH (ref 150–400)
RBC: 3.26 MIL/uL — ABNORMAL LOW (ref 3.87–5.11)
RDW: 12.9 % (ref 11.5–15.5)
WBC: 13.7 10*3/uL — ABNORMAL HIGH (ref 4.0–10.5)
nRBC: 0 % (ref 0.0–0.2)

## 2021-09-04 LAB — MAGNESIUM: Magnesium: 2 mg/dL (ref 1.7–2.4)

## 2021-09-04 LAB — SURGICAL PATHOLOGY

## 2021-09-04 SURGERY — IRRIGATION AND DEBRIDEMENT FOOT
Anesthesia: General | Site: Foot | Laterality: Left

## 2021-09-04 MED ORDER — DEXAMETHASONE SODIUM PHOSPHATE 10 MG/ML IJ SOLN
INTRAMUSCULAR | Status: DC | PRN
  Administered 2021-09-04: 5 mg via INTRAVENOUS

## 2021-09-04 MED ORDER — ORAL CARE MOUTH RINSE: 15.0000 mL | Freq: Once | OROMUCOSAL | Status: DC

## 2021-09-04 MED ORDER — OXYCODONE HCL 5 MG PO TABS: 5.0000 mg | ORAL_TABLET | Freq: Once | ORAL | Status: DC | PRN

## 2021-09-04 MED ORDER — 0.9 % SODIUM CHLORIDE (POUR BTL) OPTIME
TOPICAL | Status: DC | PRN
  Administered 2021-09-04: 1000 mL

## 2021-09-04 MED ORDER — MIDAZOLAM HCL 2 MG/2ML IJ SOLN
INTRAMUSCULAR | Status: AC
  Filled 2021-09-04: qty 2

## 2021-09-04 MED ORDER — PHENYLEPHRINE HCL-NACL 20-0.9 MG/250ML-% IV SOLN
INTRAVENOUS | Status: DC | PRN
  Administered 2021-09-04: 20 ug/min via INTRAVENOUS

## 2021-09-04 MED ORDER — OXYCODONE HCL 5 MG/5ML PO SOLN: 5.0000 mg | Freq: Once | ORAL | Status: DC | PRN

## 2021-09-04 MED ORDER — CHLORHEXIDINE GLUCONATE 0.12 % MT SOLN
OROMUCOSAL | Status: AC
  Filled 2021-09-04: qty 15

## 2021-09-04 MED ORDER — VANCOMYCIN HCL 1000 MG IV SOLR
INTRAVENOUS | Status: AC
  Filled 2021-09-04: qty 20

## 2021-09-04 MED ORDER — ACETAMINOPHEN 160 MG/5ML PO SOLN: 1000.0000 mg | Freq: Once | ORAL | Status: DC | PRN

## 2021-09-04 MED ORDER — SODIUM CHLORIDE 0.9 % IV SOLN: INTRAVENOUS | Status: DC

## 2021-09-04 MED ORDER — PROPOFOL 10 MG/ML IV BOLUS
INTRAVENOUS | Status: DC | PRN
  Administered 2021-09-04: 130 mg via INTRAVENOUS

## 2021-09-04 MED ORDER — BUPIVACAINE HCL (PF) 0.5 % IJ SOLN
INTRAMUSCULAR | Status: AC
  Filled 2021-09-04: qty 30

## 2021-09-04 MED ORDER — ONDANSETRON HCL 4 MG/2ML IJ SOLN
INTRAMUSCULAR | Status: DC | PRN
Start: 2021-09-04 — End: 2021-09-04
  Administered 2021-09-04: 4 mg via INTRAVENOUS

## 2021-09-04 MED ORDER — PROPOFOL 1000 MG/100ML IV EMUL
INTRAVENOUS | Status: AC
  Filled 2021-09-04: qty 100

## 2021-09-04 MED ORDER — SUGAMMADEX SODIUM 200 MG/2ML IV SOLN
INTRAVENOUS | Status: DC | PRN
Start: 2021-09-04 — End: 2021-09-04
  Administered 2021-09-04 (×2): 200 mg via INTRAVENOUS

## 2021-09-04 MED ORDER — METOCLOPRAMIDE HCL 5 MG/ML IJ SOLN
INTRAMUSCULAR | Status: DC | PRN
  Administered 2021-09-04: 5 mg via INTRAVENOUS

## 2021-09-04 MED ORDER — FENTANYL CITRATE (PF) 100 MCG/2ML IJ SOLN: 25.0000 ug | INTRAMUSCULAR | Status: DC | PRN

## 2021-09-04 MED ORDER — ACETAMINOPHEN 10 MG/ML IV SOLN: 1000.0000 mg | Freq: Once | INTRAVENOUS | Status: DC | PRN

## 2021-09-04 MED ORDER — CHLORHEXIDINE GLUCONATE 0.12 % MT SOLN: 15.0000 mL | Freq: Once | OROMUCOSAL | Status: DC

## 2021-09-04 MED ORDER — SUCCINYLCHOLINE CHLORIDE 200 MG/10ML IV SOSY
PREFILLED_SYRINGE | INTRAVENOUS | Status: DC | PRN
  Administered 2021-09-04: 120 mg via INTRAVENOUS

## 2021-09-04 MED ORDER — PROPOFOL 500 MG/50ML IV EMUL
INTRAVENOUS | Status: DC | PRN
  Administered 2021-09-04: 125 ug/kg/min via INTRAVENOUS
  Administered 2021-09-04: 150 ug/kg/min via INTRAVENOUS

## 2021-09-04 MED ORDER — LIDOCAINE 2% (20 MG/ML) 5 ML SYRINGE
INTRAMUSCULAR | Status: DC | PRN
Start: 2021-09-04 — End: 2021-09-04
  Administered 2021-09-04: 60 mg via INTRAVENOUS

## 2021-09-04 MED ORDER — ROCURONIUM BROMIDE 10 MG/ML (PF) SYRINGE
PREFILLED_SYRINGE | INTRAVENOUS | Status: DC | PRN
  Administered 2021-09-04: 20 mg via INTRAVENOUS

## 2021-09-04 MED ORDER — FENTANYL CITRATE (PF) 250 MCG/5ML IJ SOLN
INTRAMUSCULAR | Status: AC
  Filled 2021-09-04: qty 5

## 2021-09-04 MED ORDER — BUPIVACAINE HCL (PF) 0.5 % IJ SOLN
INTRAMUSCULAR | Status: DC | PRN
  Administered 2021-09-04: 10 mL

## 2021-09-04 MED ORDER — FENTANYL CITRATE (PF) 250 MCG/5ML IJ SOLN
INTRAMUSCULAR | Status: DC | PRN
  Administered 2021-09-04: 75 ug via INTRAVENOUS

## 2021-09-04 MED ORDER — ACETAMINOPHEN 500 MG PO TABS: 1000.0000 mg | ORAL_TABLET | Freq: Once | ORAL | Status: DC | PRN

## 2021-09-04 MED ORDER — MIDAZOLAM HCL 2 MG/2ML IJ SOLN
INTRAMUSCULAR | Status: DC | PRN
  Administered 2021-09-04: 2 mg via INTRAVENOUS

## 2021-09-04 SURGICAL SUPPLY — 39 items
BAG COUNTER SPONGE SURGICOUNT (BAG) ×3 IMPLANT
BAG SPNG CNTER NS LX DISP (BAG) ×1
BLADE SURG 10 STRL SS (BLADE) ×1 IMPLANT
BLADE SURG 15 STRL LF DISP TIS (BLADE) IMPLANT
BLADE SURG 15 STRL SS (BLADE) ×2
BNDG CMPR STD VLCR NS LF 5.8X4 (GAUZE/BANDAGES/DRESSINGS) ×1
BNDG ELASTIC 4X5.8 VLCR NS LF (GAUZE/BANDAGES/DRESSINGS) ×1 IMPLANT
BNDG ELASTIC 4X5.8 VLCR STR LF (GAUZE/BANDAGES/DRESSINGS) IMPLANT
BNDG GAUZE ELAST 4 BULKY (GAUZE/BANDAGES/DRESSINGS) ×1 IMPLANT
COVER SURGICAL LIGHT HANDLE (MISCELLANEOUS) ×3 IMPLANT
DRAPE U-SHAPE 47X51 STRL (DRAPES) ×3 IMPLANT
DRSG PAD ABDOMINAL 8X10 ST (GAUZE/BANDAGES/DRESSINGS) ×1 IMPLANT
ELECT CAUTERY BLADE 6.4 (BLADE) ×3 IMPLANT
ELECT REM PT RETURN 9FT ADLT (ELECTROSURGICAL) ×2 IMPLANT
ELECTRODE REM PT RTRN 9FT ADLT (ELECTROSURGICAL) ×2 IMPLANT
GAUZE SPONGE 4X4 12PLY STRL (GAUZE/BANDAGES/DRESSINGS) ×1 IMPLANT
GAUZE XEROFORM 1X8 LF (GAUZE/BANDAGES/DRESSINGS) ×1 IMPLANT
GLOVE SURG UNDER POLY LF SZ7.5 (GLOVE) ×3 IMPLANT
GOWN STRL REUS W/ TWL LRG LVL3 (GOWN DISPOSABLE) ×4 IMPLANT
GOWN STRL REUS W/TWL LRG LVL3 (GOWN DISPOSABLE) ×4
KIT BASIN OR (CUSTOM PROCEDURE TRAY) ×3 IMPLANT
KIT TURNOVER KIT B (KITS) ×3 IMPLANT
MANIFOLD NEPTUNE II (INSTRUMENTS) ×3 IMPLANT
NDL HYPO 25GX1X1/2 BEV (NEEDLE) IMPLANT
NEEDLE HYPO 25GX1X1/2 BEV (NEEDLE) ×2 IMPLANT
NS IRRIG 1000ML POUR BTL (IV SOLUTION) ×3 IMPLANT
PACK ORTHO EXTREMITY (CUSTOM PROCEDURE TRAY) ×3 IMPLANT
PAD ARMBOARD 7.5X6 YLW CONV (MISCELLANEOUS) ×6 IMPLANT
PULSAVAC PLUS IRRIG FAN TIP (DISPOSABLE) ×2 IMPLANT
SET CYSTO W/LG BORE CLAMP LF (SET/KITS/TRAYS/PACK) ×3 IMPLANT
SOL PREP POV-IOD 4OZ 10% (MISCELLANEOUS) ×6 IMPLANT
STAPLER VISISTAT 35W (STAPLE) ×1 IMPLANT
SUT ETHILON 3 0 PS 1 (SUTURE) ×3 IMPLANT
SUT MNCRL AB 3-0 PS2 18 (SUTURE) ×3 IMPLANT
TIP FAN IRRIG PULSAVAC PLUS (DISPOSABLE) IMPLANT
TOWEL GREEN STERILE (TOWEL DISPOSABLE) ×3 IMPLANT
TOWEL GREEN STERILE FF (TOWEL DISPOSABLE) ×3 IMPLANT
TUBE CONNECTING 12X1/4 (SUCTIONS) ×3 IMPLANT
YANKAUER SUCT BULB TIP NO VENT (SUCTIONS) ×3 IMPLANT

## 2021-09-04 NOTE — Progress Notes (Signed)
?  Lauren [name] for Infectious Disease ? ? ?Reason for visit: Follow up on diabetic foot infection ? ?Interval History: s/p left toe amputation; plan for closure today ?Day 9 total antibiotics ? ?Physical Exam: ?Constitutional:  ?Vitals:  ? 09/04/21 0530 09/04/21 0919  ?BP: 127/78 (!) 152/89  ?Pulse: 83 84  ?Resp: 16 20  ?Temp: (!) 97.5 ?F (36.4 ?C) 97.8 ?F (36.6 ?C)  ?SpO2: 96% 93%  ?She is in nad ?Respiratory: normal respiratory effort ? ? ?Review of Systems: ?Constitutional: negative for fever or chills ? ?Lab Results  ?Component Value Date  ? WBC 13.7 (H) 09/04/2021  ? HGB 9.9 (L) 09/04/2021  ? HCT 30.3 (L) 09/04/2021  ? MCV 92.9 09/04/2021  ? PLT 447 (H) 09/04/2021  ?  ?Lab Results  ?Component Value Date  ? CREATININE 2.22 (H) 09/04/2021  ? BUN 26 (H) 09/04/2021  ? NA 138 09/04/2021  ? K 3.9 09/04/2021  ? CL 109 09/04/2021  ? CO2 25 09/04/2021  ?  ?Lab Results  ?Component Value Date  ? ALT 13 08/28/2021  ? AST 11 (L) 08/28/2021  ? ALKPHOS 119 08/28/2021  ?  ? ?Microbiology: ?Recent Results (from the past 240 hour(s))  ?Culture, blood (x 2)     Status: None  ? Collection Time: 08/27/21 10:53 PM  ? Specimen: BLOOD  ?Result Value Ref Range Status  ? Specimen Description BLOOD LEFT ANTECUBITAL  Final  ? Special Requests   Final  ?  BOTTLES DRAWN AEROBIC AND ANAEROBIC Blood Culture adequate volume  ? Culture   Final  ?  NO GROWTH 7 DAYS ?Performed at [facility], [place] [address] [place], [place] [address] ?  ? Report Status 09/03/2021 FINAL  Final  ?Urine Culture     Status: Abnormal  ? Collection Time: 08/28/21  1:40 AM  ? Specimen: Urine, Clean Catch  ?Result Value Ref Range Status  ? Specimen Description URINE, CLEAN CATCH  Final  ? Special Requests   Final  ?  NONE ?Performed at [facility], [place] [address], [place], [place] [address] ?  ? Culture MULTIPLE SPECIES PRESENT, SUGGEST RECOLLECTION (A)  Final  ? Report Status 08/29/2021 FINAL  Final  ?MRSA Next Gen by PCR, Nasal     Status: None  ?  Collection Time: 08/28/21  1:40 AM  ? Specimen: Nasal Mucosa; Nasal Swab  ?Result Value Ref Range Status  ? MRSA by PCR Next Gen NOT DETECTED NOT DETECTED Final  ?  Comment: (NOTE) ?The GeneXpert MRSA Assay (FDA approved for NASAL specimens only), ?is one component of a comprehensive MRSA colonization surveillance ?program. It is not intended to diagnose MRSA infection nor to guide ?or monitor treatment for MRSA infections. ?Test performance is not FDA approved in patients less than 2 years ?old. ?Performed at [facility], [place] [address], [place], [place] ?[address] ?  ?Aerobic/Anaerobic Culture w Gram Stain (surgical/deep wound)     Status: None (Preliminary result)  ? Collection Time: 08/30/21  7:57 AM  ? Specimen: Wound  ?Result Value Ref Range Status  ? Specimen Description WOUND  Final  ? Special Requests LEFT FOOT WOUND SPEC A  Final  ? Gram Stain   Final  ?  NO WBC SEEN ?RARE GRAM POSITIVE COCCI IN PAIRS ?Performed at [facility], [place] [address], [place],  [address] ?  ? Culture   Final  ?  FEW GROUP B STREP(S.AGALACTIAE)ISOLATED ?TESTING AGAINST S. AGALACTIAE NOT ROUTINELY  PERFORMED DUE TO PREDICTABILITY OF AMP/PEN/VAN SUSCEPTIBILITY. ?NO ANAEROBES ISOLATED; CULTURE IN PROGRESS FOR 5 DAYS ?  ? Report Status PENDING  Incomplete  ?Aerobic/Anaerobic Culture w Gram Stain (surgical/deep wound)     Status: None (Preliminary result)  ? Collection Time: 08/30/21  8:05 AM  ? Specimen: Wound  ?Result Value Ref Range Status  ? Specimen Description WOUND  Final  ? Special Requests LEFT FOOT WOUND CLEAN SPEC B  Final  ? Gram Stain   Final  ?  NO WBC SEEN ?RARE GRAM POSITIVE COCCI IN PAIRS ?Performed at [facility], [place] [address], [place], [place] [address] ?  ? Culture   Final  ?  FEW GROUP B STREP(S.AGALACTIAE)ISOLATED ?TESTING AGAINST S. AGALACTIAE NOT ROUTINELY PERFORMED DUE TO PREDICTABILITY OF AMP/PEN/VAN SUSCEPTIBILITY. ?NO ANAEROBES ISOLATED; CULTURE IN PROGRESS FOR 5 DAYS ?  ? Report  Status PENDING  Incomplete  ?Aerobic/Anaerobic Culture w Gram Stain (surgical/deep wound)     Status: None (Preliminary result)  ? Collection Time: 08/30/21  8:11 AM  ? Specimen: PATH Bone biopsy; Tissue  ?Result Value Ref Range Status  ? Specimen Description TISSUE  Final  ? Special Requests LEFT FOOT SPEC C  Final  ? Gram Stain   Final  ?  NO WBC SEEN ?NO ORGANISMS SEEN ?Performed at [facility], [place] [address], [place], [place] [address] ?  ? Culture   Final  ?  RARE GROUP B STREP(S.AGALACTIAE)ISOLATED ?TESTING AGAINST S. AGALACTIAE NOT ROUTINELY PERFORMED DUE TO PREDICTABILITY OF AMP/PEN/VAN SUSCEPTIBILITY. ?NO ANAEROBES ISOLATED; CULTURE IN PROGRESS FOR 5 DAYS ?  ? Report Status PENDING  Incomplete  ?Surgical PCR screen     Status: None  ? Collection Time: 09/02/21  5:46 AM  ? Specimen: Nasal Mucosa; Nasal Swab  ?Result Value Ref Range Status  ? MRSA, PCR NEGATIVE NEGATIVE Final  ? Staphylococcus aureus NEGATIVE NEGATIVE Final  ?  Comment: (NOTE) ?The Xpert SA Assay (FDA approved for NASAL specimens in patients 33 ?years of age and older), is one component of a comprehensive ?surveillance program. It is not intended to diagnose infection nor to ?guide or monitor treatment. ?Performed at [facility], [place] [address] [place], [place] ?[address] ?  ? ? 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  Culture with Group B Strep and has been on ampicillin/sulbactam.  Plan for closure today.  Will stop antibiotics after today ? ?2.  Type 1 diabetes - high Hgb A1c and will need improved sugar control.   ? ?I will sign off, call with questions ? ? ?

## 2021-09-04 NOTE — Transfer of Care (Signed)
Immediate Anesthesia Transfer of Care Note ? ?Patient: Lauren [name] ? ?Procedure(s) Performed: IRRIGATION AND DEBRIDEMENT FOOT AND CLOSURE (Left: Foot) ? ?Patient Location: PACU ? ?Anesthesia Type:General ? ?Level of Consciousness: awake ? ?Airway & Oxygen Therapy: Patient Spontanous Breathing and Patient connected to face mask oxygen ? ?Post-op Assessment: Report given to RN and Post -op Vital signs reviewed and stable ? ?Post vital signs: Reviewed and stable ? ?Last Vitals:  ?Vitals Value Taken Time  ?BP 110/70 09/04/21 1555  ?Temp 36.6 ?C 09/04/21 1555  ?Pulse 81 09/04/21 1557  ?Resp 25 09/04/21 1557  ?SpO2 95 % 09/04/21 1557  ?Vitals shown include unvalidated device data. ? ?Last Pain:  ?Vitals:  ? 09/04/21 1339  ?TempSrc:   ?PainSc: 0-No pain  ?   ? ?Patients Stated Pain Goal: 0 (09/04/21 0127) ? ?Complications: No notable events documented. ?

## 2021-09-04 NOTE — H&P (Signed)
History and Physical Interval Note: ? ?09/04/2021 ?2:45 PM ? ?Lauren [name]  has presented today for surgery, with the diagnosis of left foot wound, prior infection.  The various methods of treatment have been discussed with the patient and family. After consideration of risks, benefits and other options for treatment, the patient has consented to   ?Procedure(s): ?IRRIGATION AND DEBRIDEMENT FOOT AND CLOSURE (Left) as a surgical intervention.  The patient's history has been reviewed, patient examined, no change in status, stable for surgery.  I have reviewed the patient's chart and labs.  Questions were answered to the patient's satisfaction.   ? ? ?Stephan Minister Tobey Schmelzle ? ? ?

## 2021-09-04 NOTE — Progress Notes (Signed)
Mobility Specialist: Progress Note ? ? 09/04/21 1220  ?Mobility  ?Activity Ambulated independently in hallway  ?Level of Assistance Contact guard assist, steadying assist  ?Assistive Device None  ?LLE Weight Bearing PWB  ?Distance Ambulated (ft) 400 ft  ?Activity Response Tolerated well  ?$Mobility charge 1 Mobility  ? ?Pt received in the chair and agreeable to ambulation. Stopped x1 when returning to pt's room with c/o pain in her L foot, resolved after sitting. Pt back to recliner after session with call bell in reach and family present in the room.  ? ?Lauren [name] ?Mobility Specialist ?Mobility Specialist [place]: [phone] ?Mobility Specialist [place]: [phone] ? ?

## 2021-09-04 NOTE — Progress Notes (Signed)
?PROGRESS NOTE ? ? ? ?Lauren [name]  CWC:[id number] DOB: Apr 26, 1981 DOA: 08/27/2021 ?PCP: Lemmie Evens, MD  ? ?Brief Narrative:  ?41 y.o. female with medical history significant of DM1, HTN, chronic systolic CHF, hypothyroidism, CKD stage IIIa, HLD, class II obesity, CAD, history of CVA presented with progressive worsening of the redness and pain in her left foot.  She was found to have left lower extremity cellulitis along with ulcer with concern for osteomyelitis.  She was started on broad-spectrum antibiotics.  Podiatry was consulted.  She underwent I&D on 08/30/2021.  ID was subsequently consulted and antibiotics switched to Unasyn.  She underwent fifth toe/metatarsal amputation on 09/02/2021. ? ?Assessment & Plan: ?  ?Left foot cellulitis/acute osteomyelitis/diabetic ulcer infection ?-ABI showed mild right lower extremity arterial disease.  Left ABI was within normal range ?-Status post I&D by podiatry on 08/30/2021.   ?-During the hospitalization, she appeared to have some kind of allergic reaction to cefepime with rash and itching with subsequent switch to meropenem.  OR cultures grew strep agalactiae.  ID consulted on 09/01/2021 and antibiotics switched to Unasyn and have been now discontinued on 09/04/2021.  She is scheduled to go to the OR for closure of the wound.  Awaiting further recommendations from podiatry.  ID has signed off.  Leukocytosis improving. ? 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 ?-Continue Lantus and CBGs with SSI ? ?Hypertension ?Hyperlipidemia ?-Blood pressure stable.  Continue metoprolol and statin with fenofibrate ? ?Hypothyroidism ?-TSH 25.  Levothyroxine dose was increased.  TSH to be rechecked in a few weeks ? ?CKD stage IIIb ?-Baseline creatinine around 2.2-3.  Currently stable.  Monitor ? ?History of stroke ?CAD: Currently stable ?-Plavix on hold due to need for surgical procedure.  Continue statin and metoprolol. ? ?Chronic  combined systolic and diastolic CHF ?-Last echo from January 2022 showed EF of 35 to 40% with grade 3 diastolic dysfunction.  Continue metoprolol.  No ACE inhibitor or ARB or Entresto due to elevated creatinine.  Jardiance on hold. ?-Strict input and output.  Daily weights.  Fluid restriction.  Patient received a dose of IV Lasix on 09/01/2021. ? ?Anemia of chronic disease ?-Possible from renal failure.  Hemoglobin stable.  Monitor intermittently ? ?Thrombocytosis ?-Possibly reactive ? ?Obesity: Weight loss counseled. ? ?DVT prophylaxis: Subcutaneous heparin ?Code Status: Full ?Family Communication: None at bedside. ?Disposition Plan: ?Status is: Inpatient ?Remains inpatient appropriate because: Of severity of illness, will be discharged when cleared by podiatry, scheduled for OR today. ? ?Consultants: Podiatry/ID ? 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 ?Antimicrobials:  ?Anti-infectives (From admission, onward)  ? ? Start     Dose/Rate Route Frequency Ordered Stop  ? 09/01/21 1145  Ampicillin-Sulbactam (UNASYN) 3 g in sodium chloride 0.9 % 100 mL IVPB       ? 3 g ?200 mL/hr over 30 Minutes Intravenous Every 6 hours 09/01/21 1051 09/04/21 2359  ? 08/31/21 2200  meropenem (MERREM) 1 g in sodium chloride 0.9 % 100 mL IVPB  Status:  Discontinued       ? 1 g ?200 mL/hr over 30 Minutes Intravenous Every 12 hours 08/31/21 1340 09/01/21 1051  ? 08/31/21 1000  meropenem (MERREM) 2 g in sodium chloride 0.9 % 100 mL IVPB  Status:  Discontinued       ? 2 g ?280 mL/hr over 30 Minutes Intravenous Every 12 hours 08/31/21 0856 08/31/21 1340  ? 08/29/21 2200  vancomycin (VANCOREADY) IVPB  1500 mg/300 mL  Status:  Discontinued       ? 1,500 mg ?150 mL/hr over 120 Minutes Intravenous Every 48 hours 08/27/21 2320 09/01/21 1051  ? 08/27/21 2330  ceFEPIme (MAXIPIME) 2 g in sodium chloride 0.9 % 100 mL IVPB  Status:  Discontinued       ? 2 g ?200 mL/hr over 30 Minutes Intravenous Every 12  hours 08/27/21 2320 08/31/21 0841  ? 08/27/21 2330  vancomycin (VANCOREADY) IVPB 2000 mg/400 mL       ? 2,000 mg ?200 mL/hr over 120 Minutes Intravenous  Once 08/27/21 2320 08/28/21 0349  ? 08/27/21 2315  metroNIDAZOLE (FLAGYL) IVPB 500 mg  Status:  Discontinued       ? 500 mg ?100 mL/hr over 60 Minutes Intravenous Every 12 hours 08/27/21 2305 08/31/21 0904  ? 08/27/21 2200  vancomycin (VANCOCIN) IVPB 1000 mg/200 mL premix  Status:  Discontinued       ? 1,000 mg ?200 mL/hr over 60 Minutes Intravenous  Once 08/27/21 2152 08/27/21 2320  ? 08/27/21 2200  piperacillin-tazobactam (ZOSYN) IVPB 3.375 g  Status:  Discontinued       ? 3.375 g ?100 mL/hr over 30 Minutes Intravenous  Once 08/27/21 2152 08/27/21 2322  ? ?  ? ? ? ?Subjective: ? ?Patient seen and examined.  She has no complaints. ? ?Objective: ?Vitals:  ? 09/03/21 1657 09/03/21 2041 09/04/21 0530 09/04/21 0919  ?BP: (!) 151/88 (!) 142/81 127/78 (!) 152/89  ?Pulse: 81 98 83 84  ?Resp: 18 18 16 20   ?Temp: 98.1 ?F (36.7 ?C) 98 ?F (36.7 ?C) (!) 97.5 ?F (36.4 ?C) 97.8 ?F (36.6 ?C)  ?TempSrc: Oral Oral Oral Oral  ?SpO2: 98% 95% 96% 93%  ?Weight:      ?Height:      ? ? ?Intake/Output Summary (Last 24 hours) at 09/04/2021 1112 ?Last data filed at 09/04/2021 0900 ?Gross per 24 hour  ?Intake 1212 ml  ?Output 0 ml  ?Net 1212 ml  ? ? ?Filed Weights  ? 08/28/21 0127 08/30/21 0717 09/01/21 2049  ?Weight: 94.4 kg 94.4 kg 94 kg  ? ? ?Examination: ? ?General exam: Appears calm and comfortable, obese ?Respiratory system: Clear to auscultation. Respiratory effort normal. ?Cardiovascular system: S1 & S2 heard, RRR. No JVD, murmurs, rubs, gallops or clicks. No pedal edema. ?Gastrointestinal system: Abdomen is nondistended, soft and nontender. No organomegaly or masses felt. Normal bowel sounds heard. ?Central nervous system: Alert and oriented. No focal neurological deficits. ?Extremities: Symmetric 5 x 5 power. ?Skin: Dressing on the left foot. ?Psychiatry: Judgement and insight appear  normal. Mood & affect appropriate.  ? ?Data Reviewed: I have personally reviewed following labs and imaging studies ? ?CBC: ?Recent Labs  ?Lab 08/31/21 ?0402 09/01/21 ?0409 09/02/21 ?0254 09/03/21 ?2706 09/04/21 ?0330  ?WBC 18.5* 15.7* 14.5* 16.7* 13.7*  ?NEUTROABS  --   --   --  12.8* 9.5*  ?HGB 11.4* 10.7* 10.8* 11.1* 9.9*  ?HCT 34.2* 32.5* 32.5* 34.2* 30.3*  ?MCV 92.4 92.1 91.5 91.7 92.9  ?PLT 398 429* 460* 508* 447*  ? ? ?Basic Metabolic Panel: ?Recent Labs  ?Lab 08/30/21 ?0132 08/31/21 ?0402 09/01/21 ?0409 09/02/21 ?0455 09/03/21 ?2376 09/04/21 ?0330  ?NA 134* 135 134* 138 141 138  ?K 3.9 3.9 4.0 3.9 4.1 3.9  ?CL 105 108 104 106 108 109  ?CO2 21* 19* 21* 25 25 25   ?GLUCOSE 279* 123* 208* 152* 168* 200*  ?BUN 40* 37* 44* 40* 31* 26*  ?CREATININE 2.27* 2.43*  2.59* 2.59* 2.36* 2.22*  ?CALCIUM 8.1* 7.8* 8.1* 8.1* 8.1* 7.9*  ?MG 2.0  --   --   --  1.9 2.0  ? ? ?GFR: ?Estimated Creatinine Clearance: 38.6 mL/min (A) (by C-G formula based on SCr of 2.22 mg/dL (H)). ?Liver Function Tests: ?No results for input(s): AST, ALT, ALKPHOS, BILITOT, PROT, ALBUMIN in the last 168 hours. ? ?No results for input(s): LIPASE, AMYLASE in the last 168 hours. ?No results for input(s): AMMONIA in the last 168 hours. ?Coagulation Profile: ?No results for input(s): INR, PROTIME in the last 168 hours. ? ?Cardiac Enzymes: ?No results for input(s): CKTOTAL, CKMB, CKMBINDEX, TROPONINI in the last 168 hours. ? ?BNP (last 3 results) ?No results for input(s): PROBNP in the last 8760 hours. ?HbA1C: ?No results for input(s): HGBA1C in the last 72 hours. ?CBG: ?Recent Labs  ?Lab 09/03/21 ?0729 09/03/21 ?1146 09/03/21 ?1657 09/03/21 ?2040 09/04/21 ?0711  ?GLUCAP 153* 142* 83 93 149*  ? ? ?Lipid Profile: ?No results for input(s): CHOL, HDL, LDLCALC, TRIG, CHOLHDL, LDLDIRECT in the last 72 hours. ?Thyroid Function Tests: ?No results for input(s): TSH, T4TOTAL, FREET4, T3FREE, THYROIDAB in the last 72 hours. ?Anemia Panel: ?No results for input(s):  VITAMINB12, FOLATE, FERRITIN, TIBC, IRON, RETICCTPCT in the last 72 hours. ?Sepsis Labs: ?No results for input(s): PROCALCITON, LATICACIDVEN in the last 168 hours. ? ? ?Recent Results (from the past 240 hour(s)

## 2021-09-04 NOTE — Anesthesia Procedure Notes (Signed)
Procedure Name: Intubation ?Date/Time: 09/04/2021 3:03 PM ?Performed by: Erick Colace, CRNA ?Pre-anesthesia Checklist: Patient identified, Emergency Drugs available, Suction available and Patient being monitored ?Patient Re-evaluated:Patient Re-evaluated prior to induction ?Oxygen Delivery Method: Circle system utilized ?Preoxygenation: Pre-oxygenation with 100% oxygen ?Induction Type: IV induction and Cricoid Pressure applied ?Ventilation: Mask ventilation without difficulty ?Laryngoscope Size: Mac and 4 ?Grade View: Grade I ?Tube type: Oral ?Tube size: 7.0 mm ?Number of attempts: 1 ?Airway Equipment and Method: Stylet and Oral airway ?Placement Confirmation: ETT inserted through vocal cords under direct vision, positive ETCO2 and breath sounds checked- equal and bilateral ?Secured at: 22 cm ?Tube secured with: Tape ?Dental Injury: Teeth and Oropharynx as per pre-operative assessment  ? ? ? ? ?

## 2021-09-04 NOTE — Progress Notes (Signed)
Nutrition Follow-up ? ?DOCUMENTATION CODES:  ? ?Not applicable ? ?INTERVENTION:  ? ?Continue Juven BID, each packet provides 80 calories, 8 grams of carbohydrate, 2.5  grams of protein (collagen), 7 grams of L-arginine and 7 grams of L-glutamine; supplement contains CaHMB, Vitamins C, E, B12 and Zinc to promote wound healing ? ?Continue MVI with Minerals ? ? ?NUTRITION DIAGNOSIS:  ? ?Increased nutrient needs related to wound healing as evidenced by estimated needs. ? ?Being addressed via supplements ? ?GOAL:  ? ?Patient will meet greater than or equal to 90% of their needs ? ?Progressing ? ?MONITOR:  ? ?PO intake, Supplement acceptance ? ?REASON FOR ASSESSMENT:  ? ?Consult ?Assessment of nutrition requirement/status, Wound healing ? ?ASSESSMENT:  ? ?Pt with PMH of DM type 1, HTN, CHF, hypothyroidism, CKD stage III, HLD, class II obesity, CAD, CVA admitted with cellulitis of the L foot with DM ulcer. ? ?4/30 I&D ?5/03 L ray amputation ?5/05 I/D foot with closure ? ?Today pt is nauseated; NPO for surgery ? ?Pt ate 75-100% of meals yesterday; appetite fair ?Pt does not really like the Juven but is drinking it as best as she can.  ? ?Current wt 94.3 kg ? ?Labs: reviewed ?Meds:  MVI with Minerals, ss novolog, semglee, reglan prn ? ?Diet Order:   ?Diet Order   ? ?       ?  Diet NPO time specified Except for: Sips with Meds  Diet effective midnight       ?  ? ?  ?  ? ?  ? ? ?EDUCATION NEEDS:  ? ?Education needs have been addressed ? ?Skin:  Skin Assessment: Skin Integrity Issues: ?Skin Integrity Issues:: Diabetic Ulcer ?Diabetic Ulcer: L foot ? ?Last BM:  5/2 ? ?Height:  ? ?Ht Readings from Last 1 Encounters:  ?09/04/21 5\' 5"  (1.651 m)  ? ? ?Weight:  ? ?Wt Readings from Last 1 Encounters:  ?09/04/21 94.3 kg  ? ? ? ?BMI:  Body mass index is 34.61 kg/m?. ? ?Estimated Nutritional Needs:  ? ?Kcal:  1900-2100 ? ?Protein:  95-115 grams ? ?Fluid:  > 1.9 L/day ? ? ?Kerman Passey MS, RDN, LDN, CNSC ?Registered Dietitian III ?Clinical  Nutrition ?RD Pager and On-Call Pager Number Located in [address]  ? ?

## 2021-09-04 NOTE — Brief Op Note (Signed)
09/04/2021 ? ?3:47 PM ? ?PATIENT:  Lauren [name]  41 y.o. female ? ?PRE-OPERATIVE DIAGNOSIS:  diabetic left foot infection ? ?POST-OPERATIVE DIAGNOSIS:  diabetic left foot infection ? ?PROCEDURE:  ?IRRIGATION AND DEBRIDEMENT FOOT AND CLOSURE (Left) ? ?SURGEON:  Surgeon(s) and Role: ?   * Jerrell Mangel, Stephan Minister, DPM - Primary ? ?ASSISTANTS: none  ? ?ANESTHESIA:   general ? ?EBL:  20 mL  ? ?BLOOD ADMINISTERED:none ? ?DRAINS: none  ? ?LOCAL MEDICATIONS USED:  MARCAINE    ? ?SPECIMEN:  No Specimen ? ?DISPOSITION OF SPECIMEN:  N/A ? ?COUNTS:  YES ? ?TOURNIQUET:  * No tourniquets in log * ? ?DICTATION: .Note written in EPIC ? ?PLAN OF CARE: Admit to inpatient  ? ?PATIENT DISPOSITION:  PACU - hemodynamically stable. ?  ?Delay start of Pharmacological VTE agent (>24hrs) due to surgical blood loss or risk of bleeding: no ? ?

## 2021-09-04 NOTE — Progress Notes (Signed)
Inpatient Diabetes Program Recommendations ? ?AACE/ADA: New Consensus Statement on Inpatient Glycemic Control (2015) ? ?Target Ranges:  Prepandial:   less than 140 mg/dL ?     Peak postprandial:   less than 180 mg/dL (1-2 hours) ?     Critically ill patients:  140 - 180 mg/dL  ? ?Lab Results  ?Component Value Date  ? GLUCAP 149 (H) 09/04/2021  ? HGBA1C 9.8 (H) 06/19/2021  ? ? ?Review of Glycemic Control ? Latest Reference Range & Units 09/03/21 07:29 09/03/21 11:46 09/03/21 16:57 09/03/21 20:40 09/04/21 07:11  ?Glucose-Capillary 70 - 99 mg/dL 153 (H) 142 (H) 83 93 149 (H)  ?(H): Data is abnormally high ? ? ?Inpatient Diabetes Program Recommendations:   ? ?Novolog 0-9 units TID  ? ?Will continue to follow while inpatient. ? ?Thank you, ?Reche Dixon, MSN, RN ?Diabetes Coordinator ?Inpatient Diabetes Program ?[phone] (team pager from 8a-5p) ? ? ?

## 2021-09-04 NOTE — Op Note (Signed)
Patient Name: Lauren [name] ?DOB: 1980-05-06  ?MRN: [id number] ?  ?Date of Service: 08/27/2021 - 09/02/2021 ? ?Surgeon: Dr. Lanae Crumbly, DPM ?Assistants: None ?Pre-operative Diagnosis:  ?Left foot open wound ?Diabetic foot infection ?Post-operative Diagnosis:  ?Left foot open wound ?Diabetic foot infection ?Procedures: ? 1) delayed secondary closure left foot ? 2) debridement to muscle layer 10.0 x 3.0 cm ?Pathology/Specimens: ?* No specimens in log * ?Anesthesia: General anesthesia ?Hemostasis: * No tourniquets in log * ?Estimated Blood Loss: 20 mL ?Materials: * No implants in log * ?Medications: 10 cc 0.5% Marcaine plain ?Complications: No complications were noted ? ?Indications for Procedure:  ?This is a 41 y.o. female with a history of severe diabetic foot infection she has undergone multiple debridements and partial fifth ray resection.  She returns today for further debridement washout and final wound closure. ?  ?Procedure in Detail: ?Patient was identified in pre-operative holding area. Formal consent was signed and the left lower extremity was marked. Patient was brought back to the operating room. Anesthesia was induced. The extremity was prepped and draped in the usual sterile fashion. Timeout was taken to confirm patient name, laterality, and procedure prior to incision.  ? ?Attention was then directed to the left foot where an open wound measuring approximate 10.0 x 3.0 cm was left open from prior open partial fifth ray amputation.  Full-thickness excisional debridement was carried down to the level of the muscle layer utilizing scalpel rongeur and curette.  Once the remainder of the nonviable tissue was removed, the incision edges were freshened with a scalpel to remove to a bleeding margin.  No new purulence was found.  The wound appeared to be appropriate for closure.  It was then thoroughly irrigated with 3 L normal sterile saline using a pulse irrigator.  Complex layered closure was then carried out  using 3-0 Monocryl, 3-0 nylon and skin staples to reapproximate the skin edges.  She had adequate tissue for complete closure. ? ?The foot was then dressed with Xeroform dry sterile dressings ABD pad and Kerlix. Patient tolerated the procedure well. ?  ?Disposition: ?Following a period of post-operative monitoring, patient will be transferred to the floor she will be stable for discharge when she is ready. ? ?

## 2021-09-04 NOTE — Anesthesia Preprocedure Evaluation (Signed)
Anesthesia Evaluation  ?Patient identified by MRN, date of birth, ID band ?Patient awake ? ? ? ?Reviewed: ?Allergy & Precautions, NPO status , Patient's Chart, lab work & pertinent test results ? ?History of Anesthesia Complications ?(+) PONV and history of anesthetic complications ? ?Airway ?Mallampati: I ? ?TM Distance: >3 FB ?Neck ROM: Full ? ? ? Dental ? ?(+) Teeth Intact, Dental Advisory Given ?  ?Pulmonary ?former smoker,  ?  ?breath sounds clear to auscultation ? ? ? ? ? ? Cardiovascular ?hypertension, Pt. on home beta blockers ?(-) angina+ CAD, + Past MI and +CHF  ? ?Rhythm:Regular Rate:Normal ? ?Echo: ? ??1. The mid to distal anteroseptal wall is akinetic. The apex is akinetic  ?with some portions dyskinetic, there is no signs of apical thrombus with  ?use of echocontrast. The mid to distal inferoseptal wall is akinetic. Marland Kitchen  ?Left ventricular ejection fraction,  ??by estimation, is 35 to 40%. The left ventricle has moderately decreased  ?function. Left ventricular diastolic parameters are consistent with Grade  ?III diastolic dysfunction (restrictive). Elevated left atrial pressure.  ??2. Right ventricular systolic function is normal. The right ventricular  ?size is normal.  ??3. Left atrial size was mildly dilated.  ??4. The mitral valve is normal in structure. Mild mitral valve  ?regurgitation. No evidence of mitral stenosis.  ??5. The inferior vena cava is normal in size with greater than 50%  ?respiratory variability, suggesting right atrial pressure of 3 mmHg.  ??6. Limited echo to evaluate LV function and wall motion. ?  ?Neuro/Psych ?CVA, No Residual Symptoms   ? GI/Hepatic ?Neg liver ROS, GERD  Medicated,  ?Endo/Other  ?diabetes, Type 2, Insulin DependentHypothyroidism  ? Renal/GU ?CRFRenal diseaseLab Results ?     Component                Value               Date                 ?     CREATININE               2.22 (H)            09/04/2021           ?  ? ?   ?Musculoskeletal ?negative musculoskeletal ROS ?(+)  ? Abdominal ?  ?Peds ? Hematology ? ?(+) Blood dyscrasia, anemia , Lab Results ?     Component                Value               Date                 ?     WBC                      13.7 (H)            09/04/2021           ?     HGB                      9.9 (L)             09/04/2021           ?     HCT                      30.3 (  L)            09/04/2021           ?     MCV                      92.9                09/04/2021           ?     PLT                      447 (H)             09/04/2021           ?   ?Anesthesia Other Findings ? ? Reproductive/Obstetrics ? ?  ? ? ? ? ? ? ? ? ? ? ? ? ? ?  ?  ? ? ? ? ? ? ? ? ?Anesthesia Physical ?Anesthesia Plan ? ?ASA: 3 ? ?Anesthesia Plan:   ? ?Post-op Pain Management:   ? ?Induction: Intravenous ? ?PONV Risk Score and Plan: 3 and Propofol infusion, TIVA, Ondansetron and Metaclopromide ? ?Airway Management Planned: Oral ETT and LMA ? ?Additional Equipment: None ? ?Intra-op Plan:  ? ?Post-operative Plan: Extubation in OR ? ?Informed Consent: I have reviewed the patients History and Physical, chart, labs and discussed the procedure including the risks, benefits and alternatives for the proposed anesthesia with the patient or authorized representative who has indicated his/her understanding and acceptance.  ? ? ? ?Dental advisory given ? ?Plan Discussed with: CRNA ? ?Anesthesia Plan Comments:   ? ? ? ? ? ? ?Anesthesia Quick Evaluation ? ?

## 2021-09-05 LAB — AEROBIC/ANAEROBIC CULTURE W GRAM STAIN (SURGICAL/DEEP WOUND): Gram Stain: NONE SEEN

## 2021-09-05 LAB — GLUCOSE, CAPILLARY: Glucose-Capillary: 387 mg/dL — ABNORMAL HIGH (ref 70–99)

## 2021-09-05 MED ORDER — LEVOTHYROXINE SODIUM 200 MCG PO TABS
200.0000 ug | ORAL_TABLET | Freq: Every day | ORAL | 0 refills | Status: DC
Start: 2021-09-06 — End: 2021-11-21

## 2021-09-05 MED ORDER — AMOXICILLIN-POT CLAVULANATE 875-125 MG PO TABS: 1.0000 | ORAL_TABLET | Freq: Two times a day (BID) | ORAL | 0 refills | Status: AC

## 2021-09-05 NOTE — Anesthesia Postprocedure Evaluation (Signed)
Anesthesia Post Note ? ?Patient: Lauren [name] ? ?Procedure(s) Performed: IRRIGATION AND DEBRIDEMENT FOOT AND CLOSURE (Left: Foot) ? ?  ? ?Patient location during evaluation: PACU ?Anesthesia Type: General ?Level of consciousness: awake and alert ?Pain management: pain level controlled ?Vital Signs Assessment: post-procedure vital signs reviewed and stable ?Respiratory status: spontaneous breathing, nonlabored ventilation and respiratory function stable ?Cardiovascular status: blood pressure returned to baseline ?Postop Assessment: no apparent nausea or vomiting ?Anesthetic complications: no ? ? ?No notable events documented. ? ?  ?  ? ?Marthenia Rolling ? ? ? ? ?

## 2021-09-05 NOTE — Plan of Care (Signed)

## 2021-09-05 NOTE — Progress Notes (Signed)
Mobility Specialist: Progress Note ? ? 09/05/21 0949  ?Mobility  ?Activity Ambulated independently in hallway  ?Level of Assistance Independent  ?Assistive Device None  ?LLE Weight Bearing PWB  ?Distance Ambulated (ft) 400 ft  ?Activity Response Tolerated well  ?$Mobility charge 1 Mobility  ? ?Received pt sitting EOB having no complaints and agreeable to mobility. Asymptomatic throughout ambulation, returned to sitting EOB w/ call bell in reach and all needs met. ? ?Lauren [name] ?Mobility Specialist ?Mobility Specialist [place]: [phone] ?Mobility Specialist [place]: [phone] ? ?

## 2021-09-05 NOTE — Discharge Summary (Signed)
PatientPhysician Discharge Summary  ?Lauren [name] HGD:[id number] DOB: 09-01-1980 DOA: 08/27/2021 ? ?PCP: Lemmie Evens, MD ? ?Admit date: 08/27/2021 ?Discharge date: 09/05/2021 ?30 Day Unplanned Readmission Risk Score   ? ?Flowsheet Row ED to Hosp-Admission (Current) from 08/27/2021 in [facility]  ?30 Day Unplanned Readmission Risk Score (%) 19.41 Filed at 09/05/2021 0800  ? ?  ? ? This score is the patient's risk of an unplanned readmission within 30 days of being discharged (0 -100%). The score is based on dignosis, age, lab data, medications, orders, and past utilization.   ?Low:  0-14.9   Medium: 15-21.9   High: 22-29.9   Extreme: 30 and above ? ?  ? ?  ? ? ? ?Admitted From: Home ?Disposition: Home ? ?Recommendations for Outpatient Follow-up:  ?Follow up with PCP in 1-2 weeks ?Please obtain BMP/CBC in one week ?Follow-up with podiatry, they will call you Monday with appointment details. ?Please follow up with your PCP on the following pending results: ?Unresulted Labs (From admission, onward)  ? ?  Start     Ordered  ? 08/27/21 2246  Culture, blood (x 2)  BLOOD CULTURE X 2,   R (with TIMED occurrences)     ?Comments: INITIATE ANTIBIOTICS WITHIN 1 HOUR AFTER BLOOD CULTURES DRAWN.  If unable to obtain blood cultures, call MD immediately regarding antibiotic instructions. ?  ? 08/27/21 2246  ? ?  ?  ? ?  ?  ? ? ?Home Health: None ?Equipment/Devices: Postop shoes ? ?Discharge Condition: Stable ?CODE STATUS: Full code ?Diet recommendation: Diabetic ? ?Subjective: Seen and examined.  Husband at bedside.  Patient has no complaints.  She is eager to go home.  She is being discharged. ? ?Brief/Interim Summary: 41 y.o. female with medical history significant of DM1, HTN, chronic systolic CHF, hypothyroidism, CKD stage IIIa, HLD, class II obesity, CAD, history of CVA presented with progressive worsening of the redness and pain in her left foot.  She was found to have left lower extremity cellulitis along  with ulcer with concern for osteomyelitis.  She was started on broad-spectrum antibiotics.  Podiatry was consulted.  She underwent I&D on 08/30/2021.  ID was subsequently consulted and antibiotics switched to Unasyn.  She underwent fifth toe/metatarsal amputation on 09/02/2021 and wound closure and final dressing on 09/04/2021.  ID recommended stopping antibiotics on 09/04/2021.  Discussed with [name] of podiatry, he has cleared the patient for discharge with instructions to leave dressing as it is, weightbearing as tolerated but he also recommended discharging patient on Augmentin for 7 days.  Patient is fully aware of all those recommendations, what podiatry will call her on Monday for appointment. ?  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  She was hypoglycemic at 38 yesterday and then she decided to go to cafeteria and eat a hotdog, a bag of chips and ice cream sandwich and her blood sugar this morning is over 300.  She understands that she is supposed to be compliant with diabetic diet.  She is confident that she can manage her diabetic with her home regimen at home. ?  ?Hypertension ?Hyperlipidemia ?-Blood pressure stable.  Continue metoprolol and statin with fenofibrate ? ?Hypothyroidism ?-TSH 25.  Levothyroxine dose was increased from 175 mcg daily to 200 mcg daily..  TSH to be rechecked in a few weeks ?  ?CKD stage IIIb ?-Baseline creatinine around 2.2-3.  Currently stable.  Monitor ?  ?History of stroke ?CAD: Currently stable ?  Resume home medications.  Plavix was held here. ? ?Chronic combined systolic and diastolic CHF ?-Last echo from January 2022 showed EF of 35 to 40% with grade 3 diastolic dysfunction.  Continue metoprolol.  No ACE inhibitor or ARB or Entresto due to elevated creatinine.  Jardiance on hold here but she can resume that at discharge. ?-Strict input and output.  Daily weights.  Fluid restriction.  Patient received a dose of IV Lasix on  09/01/2021.  Resume home dose of Lasix along with KCl. ?  ?Anemia of chronic disease ?-Possible from renal failure.  Hemoglobin stable.  Monitor intermittently ?  ?Thrombocytosis ?-Possibly reactive ?  ?Obesity: Weight loss counseled. ? ?Discharge plan was discussed with patient and/or family member and they verbalized understanding and agreed with it.  ?Discharge Diagnoses:  ?Principal Problem: ?  Foot ulcer ([place]) ?Active Problems: ?  Sepsis ([place]) ?  Mixed hyperlipidemia ?  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[place]) ?  Essential hypertension ?  Chronic combined systolic and diastolic CHF (congestive heart failure) ([place]) ?  Hypothyroidism ?  CKD stage G3b/A2, GFR 30-44 and albumin creatinine ratio 30-299 mg/g (HCC) ?  CAD (coronary artery disease) ?  Class 2 obesity ?  Hypokalemia ?  History of CVA (cerebrovascular accident) ?  Cellulitis of left foot ?  Acute osteomyelitis of left foot ([place]) ? ? ? ?Discharge Instructions ? ? ?Allergies as of 09/05/2021   ? ?   Reactions  ? Wellbutrin [bupropion] Hives  ? Cefepime Rash  ? Tolerates penicilllin  ? Ciprofloxacin Hcl Hives, Rash  ? Hives/rash at injection site   ? Tape Rash  ? ?  ? ?  ?Medication List  ?  ? ?STOP taking these medications   ? ?cephALEXin 500 MG capsule ?Commonly known as: Keflex ?  ?doxycycline 100 MG capsule ?Commonly known as: VIBRAMYCIN ?  ?ketoconazole 2 % cream ?Commonly known as: NIZORAL ?  ?triamcinolone cream 0.1 % ?Commonly known as: KENALOG ?  ? ?  ? ?TAKE these medications   ? ?acetaminophen 500 MG tablet ?Commonly known as: TYLENOL ?Take 1,500 mg by mouth every 6 (six) hours as needed for moderate pain or headache. ?  ?amoxicillin-clavulanate 875-125 MG tablet ?Commonly known as: Augmentin ?Take 1 tablet by mouth 2 (two) times daily for 7 days. ?  ?atorvastatin 40 MG tablet ?Commonly known as: LIPITOR ?TAKE 1 TABLET BY MOUTH ONCE DAILY. ?What changed:  ?how much to take ?how to take this ?when to take this ?  ?clopidogrel 75 MG  tablet ?Commonly known as: PLAVIX ?TAKE 1 TABLET BY MOUTH ONCE DAILY. ?  ?empagliflozin 10 MG Tabs tablet ?Commonly known as: JARDIANCE ?Take 1 tablet (10 mg total) by mouth daily before breakfast. ?  ?fenofibrate 160 MG tablet ?TAKE 1 TABLET BY MOUTH ONCE DAILY. ?  ?FreeStyle Libre 2 Sensor Misc ?Use to check blood sugar ?  ?furosemide 40 MG tablet ?Commonly known as: LASIX ?Take 1 tablet (40 mg total) by mouth daily as needed ?What changed: when to take this ?  ?glucagon 1 MG injection ?Inject 1 mg into the vein once as needed. ?  ?hydrALAZINE 25 MG tablet ?Commonly known as: APRESOLINE ?Take 1 & 1/2 tablets (37.5 mg total) by mouth 3 (three) times daily. ?  ?Insulin Pen Needle 31G X 5 MM Misc ?Use to inject novolog and semglee as directed ?  ?isosorbide mononitrate 30 MG 24 hr tablet ?Commonly known as: IMDUR ?Take 1 tablet (30 mg total) by mouth daily. ?  ?  levothyroxine 200 MCG tablet ?Commonly known as: SYNTHROID ?Take 1 tablet (200 mcg total) by mouth daily at 6 (six) AM. ?Start taking on: Sep 06, 2021 ?What changed:  ?medication strength ?how much to take ?when to take this ?  ?metoprolol succinate 25 MG 24 hr tablet ?Commonly known as: TOPROL-XL ?Take 25 mg by mouth daily. ?  ?mupirocin ointment 2 % ?Commonly known as: BACTROBAN ?Apply 1 application topically 2 (two) times daily. ?What changed:  ?when to take this ?additional instructions ?  ?nitroGLYCERIN 0.4 MG SL tablet ?Commonly known as: NITROSTAT ?Place 0.4 mg under the tongue every 5 (five) minutes x 3 doses as needed for chest pain. ?  ?NovoLOG FlexPen 100 UNIT/ML FlexPen ?Generic drug: insulin aspart ?Inject 2-10 Units into the skin 3 (three) times daily with meals. ?  ?pantoprazole 40 MG tablet ?Commonly known as: PROTONIX ?Take 1 tablet (40 mg total) by mouth daily as needed for acid reflux ?  ?potassium chloride SA 20 MEQ tablet ?Commonly known as: KLOR-CON M ?Take 1 tablet (20 mEq total) by mouth daily ?  ?Semglee (yfgn) 100 UNIT/ML Pen ?Generic  drug: insulin glargine-yfgn ?Inject 80 units into the skin daily ?What changed:  ?how much to take ?when to take this ?  ? ?  ? ? Follow-up Information   ? ? Lemmie Evens, MD Follow up in 1 week(s).   ?Sp

## 2021-09-06 ENCOUNTER — Encounter (HOSPITAL_COMMUNITY): Payer: Self-pay | Admitting: Podiatry

## 2021-09-07 ENCOUNTER — Ambulatory Visit (INDEPENDENT_AMBULATORY_CARE_PROVIDER_SITE_OTHER): Payer: No Typology Code available for payment source | Admitting: Podiatry

## 2021-09-07 ENCOUNTER — Encounter: Payer: Self-pay | Admitting: Podiatry

## 2021-09-07 ENCOUNTER — Other Ambulatory Visit: Payer: Self-pay

## 2021-09-07 DIAGNOSIS — M86172 Other acute osteomyelitis, left ankle and foot: Secondary | ICD-10-CM

## 2021-09-07 DIAGNOSIS — E1059 Type 1 diabetes mellitus with other circulatory complications: Secondary | ICD-10-CM

## 2021-09-07 DIAGNOSIS — Z89422 Acquired absence of other left toe(s): Secondary | ICD-10-CM

## 2021-09-07 NOTE — Patient Outreach (Signed)
Received a hospital discharge referral from [facility] Liaison. ?I have assigned Joellyn Quails,  RN to call for follow up and determine if there are any Case Management needs.  ?  ?Arville Care, CBCS, CMAA ?[place] Management Assistant ?[place] Management ?[phone]   ?

## 2021-09-09 ENCOUNTER — Other Ambulatory Visit (HOSPITAL_COMMUNITY): Payer: Self-pay

## 2021-09-09 MED ORDER — INSULIN GLARGINE-YFGN 100 UNIT/ML ~~LOC~~ SOPN
PEN_INJECTOR | SUBCUTANEOUS | 3 refills | Status: DC
  Filled 2021-09-09: qty 15, 18d supply, fill #0

## 2021-09-10 ENCOUNTER — Other Ambulatory Visit: Payer: Self-pay | Admitting: *Deleted

## 2021-09-10 NOTE — Telephone Encounter (Signed)
Please advise 

## 2021-09-10 NOTE — Patient Outreach (Addendum)
[facility]) Care Management Telephonic RN Care Manager Note   09/10/2021 Name:  Lauren [name] MRN:  [id number] DOB:  [birth year]  Summary: Outreach to patient  She answered, HIPAA verification initiated Patient requested if RN CM could return a call to her "later"  Recommendations/Changes made from today's visit: Outreach for post hospital follow up/screening for disease management needs Agreed to follow up with patient later   Subjective: Lauren [name] is an 41 y.o. year old female who is a primary patient of Lemmie Evens, MD. The care management team was consulted for assistance with care management and/or care coordination needs.    Telephonic RN Care Manager completed Telephone Visit today.   Objective:  Medications Reviewed Today     Reviewed by Randa Ngo, RN (Registered Nurse) on 09/04/21 at 1334  Med List Status: Complete   Medication Order Taking? Sig Documenting Provider Last Dose Status Informant  acetaminophen (TYLENOL) 500 MG tablet [id number] Yes Take 1,500 mg by mouth every 6 (six) hours as needed for moderate pain or headache. [provider] 08/27/2021 Active Self  atorvastatin (LIPITOR) 40 MG tablet [id number] Yes TAKE 1 TABLET BY MOUTH ONCE DAILY.  Patient taking differently: Take 40 mg by mouth daily.   Arnoldo Lenis, MD 08/27/2021 Active Self  cephALEXin (KEFLEX) 500 MG capsule [id number] Yes Take 1 capsule (500 mg total) by mouth 3 (three) times daily.  Patient taking differently: Take 500 mg by mouth See admin instructions. Tid x 10 days   Criselda Peaches, [place] 08/27/2021 Active Self           Med Note [place] Crane, MISTY D   Thu Aug 27, 2021 11:10 PM) Course began 08/24/21 pt had 1 dose. Pt had 1 dose 08/27/21  clopidogrel (PLAVIX) 75 MG tablet [id number] Yes TAKE 1 TABLET BY MOUTH ONCE DAILY.  Patient taking differently: Take 75 mg by mouth daily.   Arnoldo Lenis, MD 08/27/2021 Active Self  Continuous Blood Gluc Sensor  (FREESTYLE LIBRE 2 SENSOR) MISC [id number] Yes Use to check blood sugar   Active Self  doxycycline (VIBRAMYCIN) 100 MG capsule [id number] No Take 1 capsule (100 mg total) by mouth 2 (two) times daily.  Patient not taking: Reported on 08/27/2021   Kathie Dike, MD Completed Course Active Self  empagliflozin (JARDIANCE) 10 MG TABS tablet [id number] Yes Take 1 tablet (10 mg total) by mouth daily before breakfast. Arnoldo Lenis, MD 08/27/2021 Active Self  fenofibrate 160 MG tablet [id number] Yes TAKE 1 TABLET BY MOUTH ONCE DAILY.  Patient taking differently: Take 160 mg by mouth daily.   Arnoldo Lenis, MD 08/27/2021 Active Self  furosemide (LASIX) 40 MG tablet [id number] Yes Take 1 tablet (40 mg total) by mouth daily as needed  Patient taking differently: Take 40 mg by mouth daily.   Arnoldo Lenis, MD 08/27/2021 Active Self  glucagon (GLUCAGON EMERGENCY) 1 MG injection [id number] No Inject 1 mg into the vein once as needed.  Patient not taking: Reported on 08/27/2021   Cassandria Anger, MD Not Taking Active Self  hydrALAZINE (APRESOLINE) 25 MG tablet [id number] Yes Take 1 & 1/2 tablets (37.5 mg total) by mouth 3 (three) times daily. Arnoldo Lenis, MD 08/27/2021 Active Self  insulin glargine-yfgn (SEMGLEE) 100 UNIT/ML Pen [id number] Yes Inject 80 units into the skin daily  Patient taking differently: Inject 40 Units into the skin 2 (two) times daily.    08/27/2021 Active Self  Insulin Pen Needle 31G X 5 MM MISC  [id number] Yes Use to inject novolog and semglee as directed   Active Self  isosorbide mononitrate (IMDUR) 30 MG 24 hr tablet [id number] Yes Take 1 tablet (30 mg total) by mouth daily. Arnoldo Lenis, MD 08/27/2021 Active Self  ketoconazole (NIZORAL) 2 % cream [id number] No Apply a small amount to the affected area daily  Patient not taking: Reported on 08/27/2021    Completed Course Active Self  levothyroxine (SYNTHROID) 175 MCG tablet [id number] Yes Take 175 mcg by mouth daily  before breakfast.  [provider] 08/27/2021 Active Self  metoprolol succinate (TOPROL-XL) 25 MG 24 hr tablet [id number] Yes Take 25 mg by mouth daily. [provider] 08/27/2021 0700 Active Self  mupirocin ointment (BACTROBAN) 2 % [id number] Yes Apply 1 application topically 2 (two) times daily.  Patient taking differently: Apply 1 application. topically See admin instructions. Apply to left foot with bandage changes   Criselda Peaches, DPM 08/27/2021 Active Self  nitroGLYCERIN (NITROSTAT) 0.4 MG SL tablet [id number] No Place 0.4 mg under the tongue every 5 (five) minutes x 3 doses as needed for chest pain. [provider] unk Active Self  NOVOLOG FLEXPEN 100 UNIT/ML FlexPen [id number] Yes Inject 2-10 Units into the skin 3 (three) times daily with meals. [provider] 08/27/2021 Active Self  pantoprazole (PROTONIX) 40 MG tablet [id number] Yes Take 1 tablet (40 mg total) by mouth daily as needed for acid reflux  Past Month Active Self  potassium chloride SA (KLOR-CON M) 20 MEQ tablet [id number] Yes Take 1 tablet (20 mEq total) by mouth daily Arnoldo Lenis, MD 08/27/2021 Active Self  triamcinolone cream (KENALOG) 0.1 % [id number] No Apply a small amount to the affected area 3 times a day  Patient not taking: Reported on 08/27/2021    Completed Course Active Self             SDOH:  (Social Determinants of Health) assessments and interventions performed:    Care Plan  Review of patient past medical history, allergies, medications, health status, including review of consultants reports, laboratory and other test data, was performed as part of comprehensive evaluation for care management services.   There are no care plans that you recently modified to display for this patient.    Plan: The patient has been provided with contact information for the care management team and has been advised to call with any health related questions or concerns.  The care  management team will reach out to the patient again over the next 21-30 business  days.  Wilburt Messina L. Lavina Hamman, RN, BSN, [place] Coordinator Office number [phone] [facility] number [phone] Fax number [phone]

## 2021-09-11 ENCOUNTER — Other Ambulatory Visit (HOSPITAL_COMMUNITY): Payer: Self-pay

## 2021-09-13 ENCOUNTER — Encounter: Payer: Self-pay | Admitting: Podiatry

## 2021-09-13 NOTE — Progress Notes (Signed)
?  Subjective:  ?Patient ID: Lauren [name], female    DOB: 11/14/1980,  MRN: [id number] ? ? ? ?DOS: 09/04/2021 ?Procedure: Partial fifth ray amputation and closure ? ?41 y.o. female returns for post-op check.  Doing okay so far ? ?Review of Systems: Negative except as noted in the HPI. Denies N/V/F/Ch. ? ? ?Objective:  ?There were no vitals filed for this visit. ?There is no height or weight on file to calculate BMI. ?Constitutional Well developed. ?Well nourished.  ?Vascular Foot warm and well perfused. ?Capillary refill normal to all digits.  Calf is soft and supple, no posterior calf or knee pain, negative Homans' sign  ?Neurologic Normal speech. ?Oriented to person, place, and time. ?Epicritic sensation to light touch grossly present bilaterally.  ?Dermatologic Skin healing well without signs of infection. Skin edges well coapted without signs of infection.  Mild serous drainage no signs of infection  ?Orthopedic: There is little to no tenderness to palpation noted about the surgical site.  ? ? ?Assessment:  ? ?1. 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 ?Plan:  ?Patient was evaluated and treated and all questions answered. ? ?S/p foot surgery left ?-Progressing as expected post-operatively. ?-WB Status: WBAT in surgical shoe ?-Sutures: Plan remove in 2 weeks. ?-Medications: Complete antibiotics ?-Foot redressed.  Change every 2-3 days with dry sterile gauze ? ?Return in about 2 weeks (around 09/21/2021) for suture removal, post op (no x-rays).  ?

## 2021-09-15 ENCOUNTER — Ambulatory Visit (INDEPENDENT_AMBULATORY_CARE_PROVIDER_SITE_OTHER): Payer: No Typology Code available for payment source | Admitting: Podiatry

## 2021-09-15 DIAGNOSIS — Z89422 Acquired absence of other left toe(s): Secondary | ICD-10-CM

## 2021-09-15 MED ORDER — AMOXICILLIN-POT CLAVULANATE 875-125 MG PO TABS: 1.0000 | ORAL_TABLET | Freq: Two times a day (BID) | ORAL | 0 refills | Status: DC

## 2021-09-16 DIAGNOSIS — M79676 Pain in unspecified toe(s): Secondary | ICD-10-CM

## 2021-09-20 ENCOUNTER — Encounter: Payer: Self-pay | Admitting: Podiatry

## 2021-09-20 NOTE — Progress Notes (Signed)
  Subjective:  Patient ID: Lauren [name], female    DOB: 21-Dec-1980,  MRN: [id number]    DOS: 09/04/2021 Procedure: Partial fifth ray amputation and closure  41 y.o. female returns for post-op check.  Doing okay still having decent drainage and bandage Review of Systems: Negative except as noted in the HPI. Denies N/V/F/Ch.   Objective:  There were no vitals filed for this visit. There is no height or weight on file to calculate BMI. Constitutional Well developed. Well nourished.  Vascular Foot warm and well perfused. Capillary refill normal to all digits.  Calf is soft and supple, no posterior calf or knee pain, negative Homans' sign  Neurologic Normal speech. Oriented to person, place, and time. Epicritic sensation to light touch grossly present bilaterally.  Dermatologic Skin healing well without signs of infection. Skin edges well coapted without signs of infection.  Continued serous drainage no signs of infection  Orthopedic: There is little to no tenderness to palpation noted about the surgical site.    Assessment:   1. 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  S/p foot surgery left -Progressing as expected post-operatively. -WB Status: WBAT in surgical shoe -Sutures: Left intact plan remove next week -Medications: Still has serous drainage recommend we continue antibiotics, refill sent -Foot redressed.  Change every 2-3 days with dry sterile gauze  No follow-ups on file.

## 2021-09-21 ENCOUNTER — Ambulatory Visit (INDEPENDENT_AMBULATORY_CARE_PROVIDER_SITE_OTHER): Payer: No Typology Code available for payment source | Admitting: Podiatry

## 2021-09-21 DIAGNOSIS — E10621 Type 1 diabetes mellitus with foot ulcer: Secondary | ICD-10-CM

## 2021-09-21 DIAGNOSIS — L97422 Non-pressure chronic ulcer of left heel and midfoot with fat layer exposed: Secondary | ICD-10-CM

## 2021-09-21 DIAGNOSIS — Z89422 Acquired absence of other left toe(s): Secondary | ICD-10-CM

## 2021-09-21 NOTE — Progress Notes (Signed)
  Subjective:  Patient ID: Lauren [name], female    DOB: 16-Jun-1980,  MRN: [id number]    DOS: 09/04/2021 Procedure: Partial fifth ray amputation and closure  41 y.o. female returns for post-op check.  Feels like it is doing a bit better now  Review of Systems: Negative except as noted in the HPI. Denies N/V/F/Ch.   Objective:  There were no vitals filed for this visit. There is no height or weight on file to calculate BMI. Constitutional Well developed. Well nourished.  Vascular Foot warm and well perfused. Capillary refill normal to all digits.  Calf is soft and supple, no posterior calf or knee pain, negative Homans' sign  Neurologic Normal speech. Oriented to person, place, and time. Epicritic sensation to light touch grossly present bilaterally.  Dermatologic Delayed healing after some suture removal today.  Residual ulceration measures 4.0 x 0.4 x 0.5 cm distally and 1.5 x 0.4 x 0.5 proximally.  Improved serous drainage no signs of infection  Orthopedic: There is little to no tenderness to palpation noted about the surgical site.     Assessment:   1. 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  Ulcer left foot -We discussed the etiology and factors that are a part of the wound healing process.  We also discussed the risk of infection both soft tissue and osteomyelitis from open ulceration.  Discussed the risk of limb loss if this happens or worsens. -Debridement as below. -Dressed with Prisma, DSD. -Continue home dressing changes  3 times weekly with 4 x 4 gauze and Prisma -Continue off-loading with surgical shoe. -Encourage strict glycemic control to continue -Last antibiotics: I do not think she needs continued antibiotics currently   Procedure: Excisional  Debridement of Wound Rationale: Removal of non-viable soft tissue from the wound to promote healing.  Anesthesia: none Post-Debridement Wound Measurements: 4.0 x 0.4 x 0.5 cm distally and 1.5 x 0.4 x 0.5 proximally Type of Debridement: Sharp Excisional Tissue Removed: Non-viable soft tissue Depth of Debridement: subcutaneous tissue. Technique: Sharp excisional debridement to bleeding, viable wound base.  Dressing: Dry, sterile, compression dressing. Disposition: Patient tolerated procedure well.       Return in about 2 weeks (around 10/05/2021) for wound care, post op (no x-rays).

## 2021-09-22 ENCOUNTER — Ambulatory Visit: Payer: No Typology Code available for payment source | Admitting: Cardiology

## 2021-09-22 NOTE — Progress Notes (Unsigned)
Clinical Summary Ms. Surrette is a 41 y.o.female  seen today for follow up of the following medical problems.    1. Chronic systolic HF Jan 7619 echo: LVEF 35-40%, grade III dd, apex akinetic - imdur lowered to 15mg  daily due to headaches  - side effects of dizziness on higher beta blocker dosing, titrate slowly - avoiding ACe/ARB/ARNI/aldactone given poor renal function. - headaches on higher imdur doses   - 03/2014 cath: LVEF >60%, mid LAD 30%, D1 mod to severe small vessel, distally pruned, distal 50-60% disease. LCX patent, OM2, mild disease with distal mod to severe, RCA diffuse moderate disease not ameanble to PCI - given renal function we have not repeated her cath despite drop in LVEF 02/2020 nuclear stress: large periapical defect consistent with scar, mild apical anterolateral ischemia.   Echo reviewed , LVEF 35-40% with apical akinesis. I reviewed the echo which I had read in 12/2019 and compared images side by side. 12/2019 LVEF likely an overestimate and closer to 40%, difficult assessment given the base and mid ventricle move well and main dysfunction is focused in the apex.      - no recent SOB/DOE, no LE edema - compliant ith meds. Has not had to take prn lasix in several months   Jan 2022 echo: LVEF 35-40%, apex akinetic - had some recent LE edema, had received IVFs during recent admission with cellulitis - was having some chest heaviness, leg swelling.  - took lasix 80mg  x 2 days with improved swelling.      - jardiance??    2. HTN - she is compliant with med       3. CAD/ Elevated troponin 03/2014 Records from [facility] - admitted with DKA, found to have elevated troponins - echo with normal LVEF 65-70% - during stress test had new LBBB, referred for cath - 03/2014 cath: LVEF >60%, mid LAD 30%, D1 mod to severe small vessel, distally pruned, distal 50-60% disease. LCX patent, OM2, mild disease with distal mod to severe, RCA diffuse moderate disease not  ameanble to PCI       - admitted 01/2020 with CVA, iin this setting elevated trop to 413. EKG without acute ischemic changes - echo with some apical hypokinesis - workup deferred to allow her time to recover from stroke, also thought was this could be a stress induced CM   02/2020 nuclear stress: large periapical defect consistent with scar, mild apical anterolateral ischemia.     Jan 2022 echo LVEF 35-40%, grade III dd, apex akinetic       - no recent chest pain - no SOB/DOE - no recent edema. Has not had to take lasix in several months.          3. CKD - followed by pcp - reports seeing neprhology some time ago but not recently     4. History of CVA - admitted 01/2020 with CVA, - on DAPT for prior CVA, not for a cardiac reason.  - now just on plavix per neuro   5. Anemia - Hgb was at 7.4 when checked in 06/2020.  - admit 06/2020 with chest pain, mild trop 200s. Anemia during admit. Thought to be demandi ischemia, managed medically - last Hgb was 12.1       6. DM2 - followed by pcp   7. Hyperlipidemia - 06/2020 TC 100 TG 1098 HDL 23 LDL 55 - HgbA1c 9.5 by 06/2020 - she is on statin, fenofibrate - remains compliantw with meds  8. Cellulitis/concern for osteo - admit 08/2021 left foot cellulitis, concern for osteo - abx, had I&D 08/30/21. 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 s/p cath in 03/2014 showing 30% mid-LAD, moderate to severe disease along small D1, patent LCx, moderate to severe distal OM2 stenosis and moderate diffuse diease along RCA not amenable to PCI   CHF (congestive heart failure) ([place])    a. EF 55-60% in 12/2019 b. EF at 35-40% by echo in 05/2020   Diabetes mellitus without complication (HCC)    Myocardial infarction (HCC)    Neuropathy    Renal  disorder    stage 3 kidney disease   Stroke (HCC)      Allergies  Allergen Reactions   Wellbutrin [Bupropion] Hives   Cefepime Rash    Tolerates penicilllin   Ciprofloxacin Hcl Hives and Rash    Hives/rash at injection site    Tape Rash     Current Outpatient Medications  Medication Sig Dispense Refill   acetaminophen (TYLENOL) 500 MG tablet Take 1,500 mg by mouth every 6 (six) hours as needed for moderate pain or headache.     amoxicillin-clavulanate (AUGMENTIN) 875-125 MG tablet Take 1 tablet by mouth 2 (two) times daily. 20 tablet 0   atorvastatin (LIPITOR) 40 MG tablet TAKE 1 TABLET BY MOUTH ONCE DAILY. (Patient taking differently: Take 40 mg by mouth daily.) 90 tablet 1   clopidogrel (PLAVIX) 75 MG tablet TAKE 1 TABLET BY MOUTH ONCE DAILY. (Patient taking differently: Take 75 mg by mouth daily.) 30 tablet 6   Continuous Blood Gluc Sensor (FREESTYLE LIBRE 2 SENSOR) MISC Use to check blood sugar 2 each 9   empagliflozin (JARDIANCE) 10 MG TABS tablet Take 1 tablet (10 mg total) by mouth daily before breakfast. 90 tablet 1   fenofibrate 160 MG tablet TAKE 1 TABLET BY MOUTH ONCE DAILY. (Patient taking differently: Take 160 mg by mouth daily.) 90 tablet 3   furosemide (LASIX) 40 MG tablet Take 1 tablet (40 mg total) by mouth daily as needed (Patient taking differently: Take 40 mg by mouth daily.) 90 tablet 1   glucagon (GLUCAGON EMERGENCY) 1 MG injection Inject 1 mg into the vein once as needed. (Patient not taking: Reported on 08/27/2021) 1 each 12   hydrALAZINE (APRESOLINE) 25 MG tablet Take 1 & 1/2 tablets (37.5 mg total) by mouth 3 (three) times daily. 135 tablet 5   insulin glargine-yfgn (SEMGLEE) 100 UNIT/ML Pen Inject 80 units into the skin daily (Patient taking differently: Inject 40 Units into the skin 2 (two) times daily.) 15 mL 0   insulin glargine-yfgn (SEMGLEE, YFGN,) 100 UNIT/ML Pen INJECT UP TO 80 UNITS UNDER THE SKIN ONCE DAILY. 15 mL 3   Insulin Pen Needle 31G X 5 MM  MISC Use to inject novolog and semglee as directed 100 each 10   isosorbide mononitrate (IMDUR) 30 MG 24 hr tablet Take 1 tablet (30 mg total) by mouth daily. 90 tablet 1   levothyroxine (SYNTHROID) 200 MCG tablet Take 1 tablet (200 mcg total) by mouth daily at 6 (six) AM. 30 tablet 0   metoprolol succinate (TOPROL-XL) 25 MG 24 hr tablet Take 25 mg by mouth daily.     mupirocin ointment (BACTROBAN) 2 %  Apply 1 application topically 2 (two) times daily. (Patient taking differently: Apply 1 application. topically See admin instructions. Apply to left foot with bandage changes) 30 g 2   nitroGLYCERIN (NITROSTAT) 0.4 MG SL tablet Place 0.4 mg under the tongue every 5 (five) minutes x 3 doses as needed for chest pain.     NOVOLOG FLEXPEN 100 UNIT/ML FlexPen Inject 2-10 Units into the skin 3 (three) times daily with meals.     pantoprazole (PROTONIX) 40 MG tablet Take 1 tablet (40 mg total) by mouth daily as needed for acid reflux 90 tablet 2   potassium chloride SA (KLOR-CON M) 20 MEQ tablet Take 1 tablet (20 mEq total) by mouth daily 90 tablet 3   No current facility-administered medications for this visit.     Past Surgical History:  Procedure Laterality Date   AMPUTATION Left 09/02/2021   Procedure: AMPUTATION RAY;  Surgeon: Criselda Peaches, DPM;  Location: [place];  Service: Podiatry;  Laterality: Left;  sagittal saw, 3L bag saline & Pulse   Cardiac catherization     CHOLECYSTECTOMY     I & D EXTREMITY Left 08/30/2021   Procedure: IRRIGATION AND DEBRIDEMENT WITH BONE BIOPSY;  Surgeon: [name], DPM;  Location: [place];  Service: Podiatry;  Laterality: Left;   IRRIGATION AND DEBRIDEMENT FOOT Left 09/04/2021   Procedure: IRRIGATION AND DEBRIDEMENT FOOT AND CLOSURE;  Surgeon: Criselda Peaches, DPM;  Location: [place];  Service: Podiatry;  Laterality: Left;   TUBAL LIGATION       Allergies  Allergen Reactions   Wellbutrin [Bupropion] Hives   Cefepime Rash    Tolerates penicilllin    Ciprofloxacin Hcl Hives and Rash    Hives/rash at injection site    Tape Rash      Family History  Problem Relation Age of Onset   Thyroid disease Mother    Hypertension Mother    Hyperlipidemia Mother    CAD Mother    CVA Mother    Hyperlipidemia Father    Hypertension Father    CAD Father    Breast cancer Paternal Grandmother 69   Cancer Paternal Grandmother        lung     Social History Ms. Ferrell reports that she quit smoking about 10 months ago. Her smoking use included cigarettes. She smoked an average of .25 packs per day. She has never used smokeless tobacco. Ms. Meline reports no history of alcohol use.   Review of Systems CONSTITUTIONAL: No weight loss, fever, chills, weakness or fatigue.  HEENT: Eyes: No visual loss, blurred vision, double vision or yellow sclerae.No hearing loss, sneezing, congestion, runny nose or sore throat.  SKIN: No rash or itching.  CARDIOVASCULAR:  RESPIRATORY: No shortness of breath, cough or sputum.  GASTROINTESTINAL: No anorexia, nausea, vomiting or diarrhea. No abdominal pain or blood.  GENITOURINARY: No burning on urination, no polyuria NEUROLOGICAL: No headache, dizziness, syncope, paralysis, ataxia, numbness or tingling in the extremities. No change in bowel or bladder control.  MUSCULOSKELETAL: No muscle, back pain, joint pain or stiffness.  LYMPHATICS: No enlarged nodes. No history of splenectomy.  PSYCHIATRIC: No history of depression or anxiety.  ENDOCRINOLOGIC: No reports of sweating, cold or heat intolerance. No polyuria or polydipsia.  Marland Kitchen   Physical Examination There were no vitals filed for this visit. There were no vitals filed for this visit.  Gen: resting comfortably, no acute distress HEENT: no scleral icterus, pupils equal round and reactive, no palptable cervical adenopathy,  CV  Resp: Clear to auscultation bilaterally GI: abdomen is soft, non-tender, non-distended, normal bowel sounds, no  hepatosplenomegaly MSK: extremities are warm, no edema.  Skin: warm, no rash Neuro:  no focal deficits Psych: appropriate affect   Diagnostic Studies 12/2019 echo IMPRESSIONS     1. Left ventricular ejection fraction, by estimation, is 55 to 60%. The  left ventricle has normal function. The left ventricle demonstrates  regional wall motion abnormalities (see scoring diagram/findings for  description). There is mild left ventricular   hypertrophy. Left ventricular diastolic parameters were normal. There is  moderate hypokinesis of the left ventricular, apical. There is sluggish  apical flow without clear formed thrombus.   2. Right ventricular systolic function is normal. The right ventricular  size is normal. There is mildly elevated pulmonary artery systolic  pressure.   3. The mitral valve is normal in structure. Trivial mitral valve  regurgitation. No evidence of mitral stenosis.   4. The inferior vena cava is normal in size with greater than 50%  respiratory variability, suggesting right atrial pressure of 3 mmHg.   5. The aortic valve is tricuspid. Aortic valve regurgitation is not  visualized. No aortic stenosis is present.     02/2020 nuclear stress No diagnostic ST segment changes to indicate ischemia. Large, severe intensity, periapical defect extending into the anterolateral wall and inferior septum that is largely fixed and consistent with infarct scar. There is a small region of apical anterolateral reversibility indicating a mild ischemic territory. This is a high risk study based on defect size although region is most consistent with scar and a mild region of peri-infarct ischemia. Nuclear stress EF: 48%. Large area of periapical akinesis.     Jan 2022 echo IMPRESSIONS     1. The mid to distal anteroseptal wall is akinetic. The apex is akinetic  with some portions dyskinetic, there is no signs of apical thrombus with  use of echocontrast. The mid to distal  inferoseptal wall is akinetic. Marland Kitchen  Left ventricular ejection fraction,   by estimation, is 35 to 40%. The left ventricle has moderately decreased  function. Left ventricular diastolic parameters are consistent with Grade  III diastolic dysfunction (restrictive). Elevated left atrial pressure.   2. Right ventricular systolic function is normal. The right ventricular  size is normal.   3. Left atrial size was mildly dilated.   4. The mitral valve is normal in structure. Mild mitral valve  regurgitation. No evidence of mitral stenosis.   5. The inferior vena cava is normal in size with greater than 50%  respiratory variability, suggesting right atrial pressure of 3 mmHg.   6. Limited echo to evaluate LV function and wall motion.       Assessment and Plan   1.. Chronic systolic HF - probable ICM given her echo and nuclear findings - with her renal dysfunction have not pursued cath -prior side effects on higerh beta blocker dosing. - avoid ACE/ARB/ARNI/aldactone given renal function - will start jardiance 10mg  daily.  - continue prn lasix, some increased use after recent admission with cellulitis where she got IVFs.    2. HTN - With her CVA, CKD, and CHF needs optimal bp control - bp at goal, continue curernt meds     3. CAD - distal/small vessel disease by 2015 [facility] - with recent echo and nuclear findings would suggest she had an apical infarct some time after her cath in 2015 -  With her renal dysfunction we have not repeated a  cath.  - no symptoms, continue curren tmeds   4. Hyperlipidemia -continue current meds, labs followd by pcp     Arnoldo Lenis, M.D., F.A.C.C.

## 2021-09-24 ENCOUNTER — Encounter: Payer: Self-pay | Admitting: Physician Assistant

## 2021-09-24 ENCOUNTER — Ambulatory Visit: Payer: No Typology Code available for payment source | Admitting: Physician Assistant

## 2021-09-24 VITALS — BP 118/78 | HR 86 | Ht 65.0 in | Wt 202.0 lb

## 2021-09-24 DIAGNOSIS — E785 Hyperlipidemia, unspecified: Secondary | ICD-10-CM

## 2021-09-24 DIAGNOSIS — L03039 Cellulitis of unspecified toe: Secondary | ICD-10-CM

## 2021-09-24 DIAGNOSIS — I2510 Atherosclerotic heart disease of native coronary artery without angina pectoris: Secondary | ICD-10-CM

## 2021-09-24 DIAGNOSIS — I5042 Chronic combined systolic (congestive) and diastolic (congestive) heart failure: Secondary | ICD-10-CM

## 2021-09-24 DIAGNOSIS — E1069 Type 1 diabetes mellitus with other specified complication: Secondary | ICD-10-CM

## 2021-09-24 DIAGNOSIS — E039 Hypothyroidism, unspecified: Secondary | ICD-10-CM

## 2021-09-24 MED ORDER — ISOSORBIDE MONONITRATE ER 30 MG PO TB24: 30.0000 mg | ORAL_TABLET | Freq: Every day | ORAL | 3 refills | Status: DC

## 2021-09-24 MED ORDER — POTASSIUM CHLORIDE CRYS ER 20 MEQ PO TBCR: 20.0000 meq | EXTENDED_RELEASE_TABLET | Freq: Every day | ORAL | 3 refills | Status: DC | PRN

## 2021-09-24 MED ORDER — NITROGLYCERIN 0.4 MG SL SUBL: 0.4000 mg | SUBLINGUAL_TABLET | SUBLINGUAL | 3 refills | Status: DC | PRN

## 2021-09-24 MED ORDER — ATORVASTATIN CALCIUM 40 MG PO TABS: ORAL_TABLET | ORAL | 1 refills | Status: DC

## 2021-09-24 MED ORDER — METOPROLOL SUCCINATE ER 25 MG PO TB24: 25.0000 mg | ORAL_TABLET | Freq: Every day | ORAL | 3 refills | Status: DC

## 2021-09-24 MED ORDER — CLOPIDOGREL BISULFATE 75 MG PO TABS: 75.0000 mg | ORAL_TABLET | Freq: Every day | ORAL | 3 refills | Status: DC

## 2021-09-24 MED ORDER — FUROSEMIDE 40 MG PO TABS: 40.0000 mg | ORAL_TABLET | Freq: Every day | ORAL | 3 refills | Status: DC

## 2021-09-24 NOTE — Patient Instructions (Signed)
Medication Instructions:  Your physician recommends that you continue on your current medications as directed. Please refer to the Current Medication list given to you today.   Labwork: Please have lipids drawn at next visit with endocrinology   Testing/Procedures: None today  Follow-Up: 6 months  Any Other Special Instructions Will Be Listed Below (If Applicable).  If you need a refill on your cardiac medications before your next appointment, please call your pharmacy. Two Gram Sodium Diet 2000 mg  What is Sodium? Sodium is a mineral found naturally in many foods. The most significant source of sodium in the diet is table salt, which is about 40% sodium.  Processed, convenience, and preserved foods also contain a large amount of sodium.  The body needs only 500 mg of sodium daily to function,  A normal diet provides more than enough sodium even if you do not use salt.  Why Limit Sodium? A build up of sodium in the body can cause thirst, increased blood pressure, shortness of breath, and water retention.  Decreasing sodium in the diet can reduce edema and risk of heart attack or stroke associated with high blood pressure.  Keep in mind that there are many other factors involved in these health problems.  Heredity, obesity, lack of exercise, cigarette smoking, stress and what you eat all play a role.  General Guidelines: Do not add salt at the table or in cooking.  One teaspoon of salt contains over 2 grams of sodium. Read food labels Avoid processed and convenience foods Ask your dietitian before eating any foods not dicussed in the menu planning guidelines Consult your physician if you wish to use a salt substitute or a sodium containing medication such as antacids.  Limit milk and milk products to 16 oz (2 cups) per day.  Shopping Hints: READ LABELS!! "Dietetic" does not necessarily mean low sodium. Salt and other sodium ingredients are often added to foods during  processing.    Menu Planning Guidelines Food Group Choose More Often Avoid  Beverages (see also the milk group All fruit juices, low-sodium, salt-free vegetables juices, low-sodium carbonated beverages Regular vegetable or tomato juices, commercially softened water used for drinking or cooking  Breads and Cereals Enriched white, wheat, rye and pumpernickel bread, hard rolls and dinner rolls; muffins, cornbread and waffles; most dry cereals, cooked cereal without added salt; unsalted crackers and breadsticks; low sodium or homemade bread crumbs Bread, rolls and crackers with salted tops; quick breads; instant hot cereals; pancakes; commercial bread stuffing; self-rising flower and biscuit mixes; regular bread crumbs or cracker crumbs  Desserts and Sweets Desserts and sweets mad with mild should be within allowance Instant pudding mixes and cake mixes  Fats Butter or margarine; vegetable oils; unsalted salad dressings, regular salad dressings limited to 1 Tbs; light, sour and heavy cream Regular salad dressings containing bacon fat, bacon bits, and salt pork; snack dips made with instant soup mixes or processed cheese; salted nuts  Fruits Most fresh, frozen and canned fruits Fruits processed with salt or sodium-containing ingredient (some dried fruits are processed with sodium sulfites        Vegetables Fresh, frozen vegetables and low- sodium canned vegetables Regular canned vegetables, sauerkraut, pickled vegetables, and others prepared in brine; frozen vegetables in sauces; vegetables seasoned with ham, bacon or salt pork  Condiments, Sauces, Miscellaneous  Salt substitute with physician's approval; pepper, herbs, spices; vinegar, lemon or lime juice; hot pepper sauce; garlic powder, onion powder, low sodium soy sauce (1 Tbs.); low sodium  condiments (ketchup, chili sauce, mustard) in limited amounts (1 tsp.) fresh ground horseradish; unsalted tortilla chips, pretzels, potato chips, popcorn, salsa  (1/4 cup) Any seasoning made with salt including garlic salt, celery salt, onion salt, and seasoned salt; sea salt, rock salt, kosher salt; meat tenderizers; monosodium glutamate; mustard, regular soy sauce, barbecue, sauce, chili sauce, teriyaki sauce, steak sauce, Worcestershire sauce, and most flavored vinegars; canned gravy and mixes; regular condiments; salted snack foods, olives, picles, relish, horseradish sauce, catsup   Food preparation: Try these seasonings Meats:    Pork Sage, onion Serve with applesauce  Chicken Poultry seasoning, thyme, parsley Serve with cranberry sauce  Lamb Curry powder, rosemary, garlic, thyme Serve with mint sauce or jelly  Veal Marjoram, basil Serve with current jelly, cranberry sauce  Beef Pepper, bay leaf Serve with dry mustard, unsalted chive butter  Fish [place] leaf, dill Serve with unsalted lemon butter, unsalted parsley butter  Vegetables:    Asparagus Lemon juice   Broccoli Lemon juice   Carrots Mustard dressing parsley, mint, nutmeg, glazed with unsalted butter and sugar   Green beans Marjoram, lemon juice, nutmeg,dill seed   Tomatoes Basil, marjoram, onion   Spice /blend for [employer]" 4 tsp ground thyme 1 tsp ground sage 3 tsp ground rosemary 4 tsp ground marjoram   Test your knowledge A product that says "Salt Free" may still contain sodium. True or False Garlic Powder and Hot Pepper Sauce an be used as alternative seasonings.True or False Processed foods have more sodium than fresh foods.  True or False Canned Vegetables have less sodium than froze True or False   WAYS TO DECREASE YOUR SODIUM INTAKE Avoid the use of added salt in cooking and at the table.  Table salt (and other prepared seasonings which contain salt) is probably one of the greatest sources of sodium in the diet.  Unsalted foods can gain flavor from the sweet, sour, and butter taste sensations of herbs and spices.  Instead of using salt for seasoning, try the following  seasonings with the foods listed.  Remember: how you use them to enhance natural food flavors is limited only by your creativity... Allspice-Meat, fish, eggs, fruit, peas, red and yellow vegetables Almond Extract-Fruit baked goods Anise Seed-Sweet breads, fruit, carrots, beets, cottage cheese, cookies (tastes like licorice) Basil-Meat, fish, eggs, vegetables, rice, vegetables salads, soups, sauces Bay Leaf-Meat, fish, stews, poultry Burnet-Salad, vegetables (cucumber-like flavor) Caraway Seed-Bread, cookies, cottage cheese, meat, vegetables, cheese, rice Cardamon-Baked goods, fruit, soups Celery Powder or seed-Salads, salad dressings, sauces, meatloaf, soup, bread.Do not use  celery salt Chervil-Meats, salads, fish, eggs, vegetables, cottage cheese (parsley-like flavor) Chili Power-Meatloaf, chicken cheese, corn, eggplant, egg dishes Chives-Salads cottage cheese, egg dishes, soups, vegetables, sauces Cilantro-Salsa, casseroles Cinnamon-Baked goods, fruit, pork, lamb, chicken, carrots Cloves-Fruit, baked goods, fish, pot roast, green beans, beets, carrots Coriander-Pastry, cookies, meat, salads, cheese (lemon-orange flavor) Cumin-Meatloaf, fish,cheese, eggs, cabbage,fruit pie (caraway flavor) [employer], fruit, eggs, fish, poultry, cottage cheese, vegetables Dill Seed-Meat, cottage cheese, poultry, vegetables, fish, salads, bread Fennel Seed-Bread, cookies, apples, pork, eggs, fish, beets, cabbage, cheese, Licorice-like flavor Garlic-(buds or powder) Salads, meat, poultry, fish, bread, butter, vegetables, potatoes.Do not  use garlic salt Ginger-Fruit, vegetables, baked goods, meat, fish, poultry Horseradish Root-Meet, vegetables, butter Lemon Juice or Extract-Vegetables, fruit, tea, baked goods, fish salads Mace-Baked goods fruit, vegetables, fish, poultry (taste like nutmeg) Maple Extract-Syrups Marjoram-Meat, chicken, fish, vegetables, breads, green salads (taste like  Sage) Mint-Tea, lamb, sherbet, vegetables, desserts, carrots, cabbage Mustard, Dry or Seed-Cheese, eggs, meats,  vegetables, poultry Nutmeg-Baked goods, fruit, chicken, eggs, vegetables, desserts Onion Powder-Meat, fish, poultry, vegetables, cheese, eggs, bread, rice salads (Do not use   Onion salt) Orange Extract-Desserts, baked goods Oregano-Pasta, eggs, cheese, onions, pork, lamb, fish, chicken, vegetables, green salads Paprika-Meat, fish, poultry, eggs, cheese, vegetables Parsley Flakes-Butter, vegetables, meat fish, poultry, eggs, bread, salads (certain forms may   Contain sodium Pepper-Meat fish, poultry, vegetables, eggs Peppermint Extract-Desserts, baked goods Poppy Seed-Eggs, bread, cheese, fruit dressings, baked goods, noodles, vegetables, cottage  [employer], poultry, meat, fish, cauliflower, turnips,eggs bread Saffron-Rice, bread, veal, chicken, fish, eggs Sage-Meat, fish, poultry, onions, eggplant, tomateos, pork, stews Savory-Eggs, salads, poultry, meat, rice, vegetables, soups, pork Tarragon-Meat, poultry, fish, eggs, butter, vegetables (licorice-like flavor)  Thyme-Meat, poultry, fish, eggs, vegetables, (clover-like flavor), sauces, soups Tumeric-Salads, butter, eggs, fish, rice, vegetables (saffron-like flavor) Vanilla Extract-Baked goods, candy Vinegar-Salads, vegetables, meat marinades Walnut Extract-baked goods, candy   2. Choose your Foods Wisely   The following is a list of foods to avoid which are high in sodium:  Meats-Avoid all smoked, canned, salt cured, dried and kosher meat and fish as well as Anchovies   [employer] meats:Bologna, Liverwurst, Pastrami Canned meat or fish  Marinated herring Caviar    Pepperoni Corned Beef   Pizza Dried chipped beef  Salami Frozen breaded fish or meat Salt pork Frankfurters or hot dogs  Sardines Gefilte fish   Sausage Ham (boiled ham, Proscuitto Smoked butt     spiced ham)   Spam      TV Dinners Vegetables Canned vegetables (Regular) Relish Canned mushrooms  Sauerkraut Olives    Tomato juice Pickles  Bakery and Dessert Products Canned puddings  Cream pies Cheesecake   Decorated cakes Cookies  Beverages/Juices Tomato juice, regular  Gatorade   V-8 vegetable juice, regular  Breads and Cereals Biscuit mixes   Salted potato chips, corn chips, pretzels Bread stuffing mixes  Salted crackers and rolls Pancake and waffle mixes Self-rising flour  Seasonings Accent    Meat sauces Barbecue sauce  Meat tenderizer Catsup    Monosodium glutamate (MSG) Celery salt   Onion salt Chili sauce   Prepared mustard Garlic salt   Salt, seasoned salt, sea salt Gravy mixes   Soy sauce Horseradish   Steak sauce Ketchup   Tartar sauce Lite salt    Teriyaki sauce Marinade mixes   Worcestershire sauce  Others Baking powder   Cocoa and cocoa mixes Baking soda   Commercial casserole mixes Candy-caramels, chocolate  Dehydrated soups    Bars, fudge,nougats  Instant rice and pasta mixes Canned broth or soup  Maraschino cherries Cheese, aged and processed cheese and cheese spreads  Learning Assessment Quiz  Indicated T (for True) or F (for False) for each of the following statements:  _____ Fresh fruits and vegetables and unprocessed grains are generally low in sodium _____ Water may contain a considerable amount of sodium, depending on the source _____ You can always tell if a food is high in sodium by tasting it _____ Certain laxatives my be high in sodium and should be avoided unless prescribed   by a physician or pharmacist _____ Salt substitutes may be used freely by anyone on a sodium restricted diet _____ Sodium is present in table salt, food additives and as a natural component of   most foods _____ Table salt is approximately 90% sodium _____ Limiting sodium intake may help prevent excess fluid accumulation in the body _____ On a  sodium-restricted diet, seasonings  such as bouillon soy sauce, and    cooking wine should be used in place of table salt _____ On an ingredient list, a product which lists monosodium glutamate as the first   ingredient is an appropriate food to include on a low sodium diet  Circle the best answer(s) to the following statements (Hint: there may be more than one correct answer)  11. On a low-sodium diet, some acceptable snack items are:    A. Olives  F. Bean dip   K. Grapefruit juice    B. Salted Pretzels G. Commercial Popcorn   L. Canned peaches    C. Carrot Sticks  H. Bouillon   M. Unsalted nuts   D. [place] fries  I. Peanut butter crackers N. Salami   E. Sweet pickles J. Tomato Juice   O. Pizza  12.  Seasonings that may be used freely on a reduced - sodium diet include   A. Lemon wedges F.Monosodium glutamate K. Celery seed    B.Soysauce   G. Pepper   L. Mustard powder   C. Sea salt  H. Cooking wine  M. Onion flakes   D. Vinegar  E. Prepared horseradish N. Salsa   E. Sage   J. Worcestershire sauce  O. Chutney

## 2021-09-24 NOTE — Progress Notes (Signed)
Cardiology Office Note   Date:  09/24/2021   ID:  Lauren [name], DOB 01-24-1981, MRN [id number]  PCP:  Lemmie Evens, MD Cardiologist:  Carlyle Dolly, MD  Electrphysiologist: None Lauren Ferries, PA-C   No chief complaint on file.   History of Present Illness: Lauren [name] is a 41 y.o. 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  Foot is doing pretty well, but the doc had to open up a couple of spots and remove tissue to help her heal well. She has gone from 1 week visits to 2 week visits, ABX stop soon.   Lasix was held on admit, she got lots of IVF. Her weight went up at least 15 lbs. Since d/c, she has been taking Lasix daily and has lost the weight.  She feels well.  She was on 3 antibiotics at 1 point and got a rash.  The rash has generally resolved, but she has some rough looking dry skin areas where the rash used to be.  This is not responding to regular body lotion.  She is walking on her foot, no problems w/ that. 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   Never gets chest pain.   Is compliant w/ meds, needs refills >> will send in.  She is not sure how much the Vania Rea will cost, it may be cost prohibitive.  She will let us know.  No LE edema, no DOE, no orthopnea or PND.   Past Medical History:  Diagnosis Date   Anemia    CAD (coronary artery disease)    a. s/p cath in 03/2014 showing 30% mid-LAD, moderate to severe disease along small D1, patent LCx, moderate to severe distal OM2 stenosis and moderate diffuse diease along RCA not amenable to PCI   CHF  (congestive heart failure) ([place])    a. EF 55-60% in 12/2019 b. EF at 35-40% by echo in 05/2020   Diabetes mellitus without complication ([place])    Myocardial infarction [facility])    Neuropathy    Renal disorder    stage 3 kidney disease   Stroke [facility])     Past Surgical History:  Procedure Laterality Date   AMPUTATION Left 09/02/2021   Procedure: AMPUTATION RAY;  Surgeon: Criselda Peaches, DPM;  Location: [place];  Service: Podiatry;  Laterality: Left;  sagittal saw, 3L bag saline & Pulse   Cardiac catherization     CHOLECYSTECTOMY     I & D EXTREMITY Left 08/30/2021   Procedure: IRRIGATION AND DEBRIDEMENT WITH BONE BIOPSY;  Surgeon: [name], DPM;  Location: [place];  Service: Podiatry;  Laterality: Left;   IRRIGATION AND DEBRIDEMENT FOOT Left 09/04/2021   Procedure: IRRIGATION AND DEBRIDEMENT FOOT AND CLOSURE;  Surgeon: Criselda Peaches, DPM;  Location: [place];  Service: Podiatry;  Laterality: Left;   TUBAL LIGATION      Current Outpatient Medications  Medication Sig Dispense Refill   acetaminophen (TYLENOL) 500 MG tablet Take 1,500 mg by mouth every 6 (six) hours as needed for moderate pain or headache.     amoxicillin-clavulanate (AUGMENTIN)  875-125 MG tablet Take 1 tablet by mouth 2 (two) times daily. 20 tablet 0   atorvastatin (LIPITOR) 40 MG tablet TAKE 1 TABLET BY MOUTH ONCE DAILY. (Patient taking differently: Take 40 mg by mouth daily.) 90 tablet 1   clopidogrel (PLAVIX) 75 MG tablet TAKE 1 TABLET BY MOUTH ONCE DAILY. (Patient taking differently: Take 75 mg by mouth daily.) 30 tablet 6   Continuous Blood Gluc Sensor (FREESTYLE LIBRE 2 SENSOR) MISC Use to check blood sugar 2 each 9   empagliflozin (JARDIANCE) 10 MG TABS tablet Take 1 tablet (10 mg total) by mouth daily before breakfast. 90 tablet 1   fenofibrate 160 MG tablet TAKE 1 TABLET BY MOUTH ONCE DAILY. (Patient taking differently: Take 160 mg by mouth daily.) 90 tablet 3   furosemide (LASIX) 40 MG tablet Take 40 mg by mouth  daily.     hydrALAZINE (APRESOLINE) 25 MG tablet Take 1 & 1/2 tablets (37.5 mg total) by mouth 3 (three) times daily. 135 tablet 5   insulin glargine-yfgn (SEMGLEE) 100 UNIT/ML Pen Inject 80 units into the skin daily (Patient taking differently: Inject 40 Units into the skin 2 (two) times daily.) 15 mL 0   insulin glargine-yfgn (SEMGLEE, YFGN,) 100 UNIT/ML Pen INJECT UP TO 80 UNITS UNDER THE SKIN ONCE DAILY. 15 mL 3   Insulin Pen Needle 31G X 5 MM MISC Use to inject novolog and semglee as directed 100 each 10   isosorbide mononitrate (IMDUR) 30 MG 24 hr tablet Take 1 tablet (30 mg total) by mouth daily. 90 tablet 1   levothyroxine (SYNTHROID) 200 MCG tablet Take 1 tablet (200 mcg total) by mouth daily at 6 (six) AM. 30 tablet 0   metoprolol succinate (TOPROL-XL) 25 MG 24 hr tablet Take 25 mg by mouth daily.     nitroGLYCERIN (NITROSTAT) 0.4 MG SL tablet Place 0.4 mg under the tongue every 5 (five) minutes x 3 doses as needed for chest pain.     NOVOLOG FLEXPEN 100 UNIT/ML FlexPen Inject 2-10 Units into the skin 3 (three) times daily with meals.     pantoprazole (PROTONIX) 40 MG tablet Take 1 tablet (40 mg total) by mouth daily as needed for acid reflux 90 tablet 2   potassium chloride SA (KLOR-CON M) 20 MEQ tablet Take 1 tablet (20 mEq total) by mouth daily 90 tablet 3   glucagon (GLUCAGON EMERGENCY) 1 MG injection Inject 1 mg into the vein once as needed. (Patient not taking: Reported on 08/27/2021) 1 each 12   mupirocin ointment (BACTROBAN) 2 % Apply 1 application topically 2 (two) times daily. (Patient not taking: Reported on 09/24/2021) 30 g 2   No current facility-administered medications for this visit.    Allergies:   Wellbutrin [bupropion], Cefepime, Ciprofloxacin hcl, and Tape    Social History:  The patient  reports that she quit smoking about 10 months ago. Her smoking use included cigarettes. She smoked an average of .25 packs per day. She has never used smokeless tobacco. She reports  that she does not drink alcohol and does not use drugs.   Family History:  The patient's family history includes Breast cancer (age of onset: 42) in her paternal grandmother; CAD in her father and mother; CVA in her mother; Cancer in her paternal grandmother; Hyperlipidemia in her father and mother; Hypertension in her father and mother; Thyroid disease in her mother.  She indicated that her mother is alive. She indicated that her father is alive. She indicated that her  sister is alive. She indicated that her maternal grandmother is deceased. She indicated that her maternal grandfather is deceased. She indicated that her paternal grandmother is deceased. She indicated that her paternal grandfather is deceased. She indicated that her daughter is alive. She indicated that her son is alive.   ROS:  Please see the history of present illness. All other systems are reviewed and negative.    PHYSICAL EXAM: VS:  BP 118/78   Pulse 86   Ht 5\' 5"  (1.651 m)   Wt 202 lb (91.6 kg)   SpO2 99%   BMI 33.61 kg/m  , BMI Body mass index is 33.61 kg/m. GEN: Well nourished, well developed, female in no acute distress HEENT: normal for age  Neck: no JVD, no carotid bruit, no masses Cardiac: RRR; no murmur, no rubs, or gallops Respiratory:  clear to auscultation bilaterally, normal work of breathing GI: soft, nontender, nondistended, + BS MS: no deformity or atrophy; no edema; distal pulses are 2+ in 3/4 extremities, unable to access left pedal pulses due to dressing and bandaging, capillary refill is within normal limits Skin: warm and dry, no rash Neuro:  Strength and sensation are intact Psych: euthymic mood, full affect   EKG:  EKG is not ordered today.   ECHO: LIMITED 05/26/2020  1. The mid to distal anteroseptal wall is akinetic. The apex is akinetic  with some portions dyskinetic, there is no signs of apical thrombus with  use of echocontrast. The mid to distal inferoseptal wall is akinetic. Marland Kitchen   Left ventricular ejection fraction, by estimation, is 35 to 40%. The left ventricle has moderately decreased function. Left ventricular diastolic parameters are consistent with Grade III diastolic dysfunction (restrictive). Elevated left atrial pressure.   2. Right ventricular systolic function is normal. The right ventricular  size is normal.   3. Left atrial size was mildly dilated.   4. The mitral valve is normal in structure. Mild mitral valve  regurgitation. No evidence of mitral stenosis.   5. The inferior vena cava is normal in size with greater than 50%  respiratory variability, suggesting right atrial pressure of 3 mmHg.   6. Limited echo to evaluate LV function and wall motion.   [place]: 03/2014 [place]   LVEF >60%, mid LAD 30%, D1 mod to severe small vessel, distally pruned, distal 50-60% disease. LCX patent, OM2, mild disease with distal mod to severe, RCA diffuse moderate disease not ameanble to PCI  MYOVIEW: 02/01/2020 No diagnostic ST segment changes to indicate ischemia. Large, severe intensity, periapical defect extending into the anterolateral wall and inferior septum that is largely fixed and consistent with infarct scar. There is a small region of apical anterolateral reversibility indicating a mild ischemic territory. This is a high risk study based on defect size although region is most consistent with scar and a mild region of peri-infarct ischemia. Nuclear stress EF: 48%. Large area of periapical akinesis.   Recent Labs: 08/28/2021: ALT 13; TSH 25.117 09/04/2021: BUN 26; Creatinine, Ser 2.22; Hemoglobin 9.9; Magnesium 2.0; Platelets 447; Potassium 3.9; Sodium 138  CBC    Component Value Date/Time   WBC 13.7 (H) 09/04/2021 0330   RBC 3.26 (L) 09/04/2021 0330   HGB 9.9 (L) 09/04/2021 0330   HCT 30.3 (L) 09/04/2021 0330   PLT 447 (H) 09/04/2021 0330   MCV 92.9 09/04/2021 0330   MCH 30.4 09/04/2021 0330   MCHC 32.7 09/04/2021 0330   RDW 12.9 09/04/2021 0330    LYMPHSABS 2.8 09/04/2021 0330  MONOABS 0.9 09/04/2021 0330   EOSABS 0.3 09/04/2021 0330   BASOSABS 0.0 09/04/2021 0330      Latest Ref Rng & Units 09/04/2021    3:30 AM 09/03/2021    3:36 AM 09/02/2021    4:55 AM  CMP  Glucose 70 - 99 mg/dL 200   168   152    BUN 6 - 20 mg/dL 26   31   40    Creatinine 0.44 - 1.00 mg/dL 2.22   2.36   2.59    Sodium 135 - 145 mmol/L 138   141   138    Potassium 3.5 - 5.1 mmol/L 3.9   4.1   3.9    Chloride 98 - 111 mmol/L 109   108   106    CO2 22 - 32 mmol/L 25   25   25     Calcium 8.9 - 10.3 mg/dL 7.9   8.1   8.1     Lab Results  Component Value Date   TSH 25.117 (H) 08/28/2021   Lipid Panel Lab Results  Component Value Date   CHOL 100 06/13/2020   HDL 23 (L) 06/13/2020   LDLCALC 55 06/13/2020   LDLDIRECT 84.9 01/01/2020   TRIG 109 06/13/2020   CHOLHDL 4.3 06/13/2020   Lab Results  Component Value Date   HGBA1C 9.0 (H) 07/01/2021     Wt Readings from Last 3 Encounters:  09/24/21 202 lb (91.6 kg)  09/05/21 218 lb 4.1 oz (99 kg)  06/25/21 215 lb 3.2 oz (97.6 kg)     Other studies Reviewed: Additional studies/ records that were reviewed today include: Office notes, hospital records and testing.  ASSESSMENT AND PLAN:  1.  CAD: - I reviewed her cath results from 2015, and explained why she needs to be on the drugs she is on. - She said she was not put on cardiac meds in 2015, did not get started on them until she was admitted with a CVA a few years ago. - She is running out of her cardiac meds, and we will refill these. -The Jardiance may be cost prohibitive for her because her insulins are expensive, we do not currently have any samples, we may have a co-pay card   2.  Chronic combined CHF -05/2020 Limited echo, her EF was 35-40% with wall motion abnormalities and grade 3 DD. -Her weight is down 16 pounds since discharge from the hospital, she says she was volume overloaded when she went home and her weight improved with daily  Lasix/potassium -She says she will be vigilant about weighing daily -Okay to change the Lasix to as needed only, continue giving her 30 tablets a month in case she needs to take it daily -She has an appointment with the endocrinologist next week, so will not check any labs today, check electrolytes and kidney function at her next blood draw  3.  Hypothyroid - Her Synthroid was increased from 175 mcg up to 200 mcg daily -However, she had been having a lot of problems with nausea and vomiting prior to admission and had missed some doses of Synthroid - She has tolerated this well, endocrinologist to review  4.  Dyslipidemia -Her lipid profile is over a-year-old, she is requested to get it done when she sees the endocrinologist next week -Continue Lipitor  5.  Left fifth toe cellulitis s/p amputation of the left fifth toe and metatarsal -She still has drainage coming from the wound, mostly serosanguineous. -She has been  compliant with antibiotics but they are almost completed - Continue wound care per podiatry    Current medicines are reviewed at length with the patient today.  The patient has concerns regarding medicines.  Concerns were addressed.  The following changes have been made: Change Lasix to as needed but continue giving 30 tablets  Labs/ tests ordered today include:  Please obtain BMET, lipid profile, and liver functions at her next blood draw.   Disposition:   FU with Carlyle Dolly, MD in 6 months  Signed, Lauren Ferries, PA-C  09/24/2021 1:06 PM    [place] Phone: [phone]; Fax: [phone]

## 2021-09-29 ENCOUNTER — Encounter: Payer: Self-pay | Admitting: Nurse Practitioner

## 2021-09-29 ENCOUNTER — Ambulatory Visit (INDEPENDENT_AMBULATORY_CARE_PROVIDER_SITE_OTHER): Payer: No Typology Code available for payment source | Admitting: Nurse Practitioner

## 2021-09-29 VITALS — BP 132/88 | HR 87 | Ht 65.0 in | Wt 208.0 lb

## 2021-09-29 DIAGNOSIS — I10 Essential (primary) hypertension: Secondary | ICD-10-CM

## 2021-09-29 DIAGNOSIS — E782 Mixed hyperlipidemia: Secondary | ICD-10-CM | POA: Diagnosis not present

## 2021-09-29 DIAGNOSIS — E039 Hypothyroidism, unspecified: Secondary | ICD-10-CM | POA: Diagnosis not present

## 2021-09-29 DIAGNOSIS — E1065 Type 1 diabetes mellitus with hyperglycemia: Secondary | ICD-10-CM | POA: Diagnosis not present

## 2021-09-29 LAB — POCT GLYCOSYLATED HEMOGLOBIN (HGB A1C): HbA1c POC (<> result, manual entry): 9.6 % (ref 4.0–5.6)

## 2021-09-29 MED ORDER — DEXCOM G6 TRANSMITTER MISC: 3 refills | Status: DC

## 2021-09-29 MED ORDER — DEXCOM G6 SENSOR MISC
3 refills | Status: DC
  Filled 2021-10-30: qty 3, 30d supply, fill #0
  Filled 2021-11-17 – 2021-11-23 (×2): qty 3, 30d supply, fill #1

## 2021-09-29 NOTE — Progress Notes (Signed)
Endocrinology Consult Note       09/29/2021, 12:02 PM   Subjective:    Patient ID: Lauren [name], female    DOB: 1981-04-19.  Lauren [name] is being seen in consultation for management of currently uncontrolled symptomatic diabetes requested by  Lemmie Evens, MD.   Past Medical History:  Diagnosis Date   Anemia    CAD (coronary artery disease)    a. s/p cath in 03/2014 showing 30% mid-LAD, moderate to severe disease along small D1, patent LCx, moderate to severe distal OM2 stenosis and moderate diffuse diease along RCA not amenable to PCI   CHF (congestive heart failure) ([place])    a. EF 55-60% in 12/2019 b. EF at 35-40% by echo in 05/2020   Diabetes mellitus without complication ([place])    Myocardial infarction ([place])    Neuropathy    Renal disorder    stage 3 kidney disease   Stroke [facility])     Past Surgical History:  Procedure Laterality Date   AMPUTATION Left 09/02/2021   Procedure: AMPUTATION RAY;  Surgeon: Criselda Peaches, DPM;  Location: [place];  Service: Podiatry;  Laterality: Left;  sagittal saw, 3L bag saline & Pulse   Cardiac catherization     CHOLECYSTECTOMY     I & D EXTREMITY Left 08/30/2021   Procedure: IRRIGATION AND DEBRIDEMENT WITH BONE BIOPSY;  Surgeon: [name], DPM;  Location: [place];  Service: Podiatry;  Laterality: Left;   IRRIGATION AND DEBRIDEMENT FOOT Left 09/04/2021   Procedure: IRRIGATION AND DEBRIDEMENT FOOT AND CLOSURE;  Surgeon: Criselda Peaches, DPM;  Location: [place];  Service: Podiatry;  Laterality: Left;   TUBAL LIGATION      Social History   Socioeconomic History   Marital status: Married    Spouse name: [name]   Number of children: Not on file   Years of education: Not on file   Highest education level: Not on file  Occupational History    Comment: Full time  Tobacco Use   Smoking status: Former    Packs/day: 0.25    Types: Cigarettes    Quit date: 10/27/2020     Years since quitting: 0.9   Smokeless tobacco: Never  Vaping Use   Vaping Use: Never used  Substance and Sexual Activity   Alcohol use: No    Alcohol/week: 0.0 standard drinks   Drug use: No   Sexual activity: Yes    Birth control/protection: Surgical    Comment: tubal   Other Topics Concern   Not on file  Social History Narrative   Lives with husband and kids   Right handed   Drinks 9+ cups caffeine daily   Social Determinants of Health   Financial Resource Strain: Not on file  Food Insecurity: Not on file  Transportation Needs: Not on file  Physical Activity: Not on file  Stress: Not on file  Social Connections: Not on file    Family History  Problem Relation Age of Onset   Thyroid disease Mother    Hypertension Mother    Hyperlipidemia Mother    CAD Mother    CVA Mother    Hyperlipidemia Father    Hypertension Father  CAD Father    Breast cancer Paternal Grandmother 14   Cancer Paternal Grandmother        lung    Outpatient Encounter Medications as of 09/29/2021  Medication Sig   Continuous Blood Gluc Sensor (DEXCOM G6 SENSOR) MISC Change sensor every 10 days as directed   Continuous Blood Gluc Transmit (DEXCOM G6 TRANSMITTER) MISC Change transmitter every 90 days as directed.   acetaminophen (TYLENOL) 500 MG tablet Take 1,500 mg by mouth every 6 (six) hours as needed for moderate pain or headache.   atorvastatin (LIPITOR) 40 MG tablet TAKE 1 TABLET BY MOUTH ONCE DAILY.   clopidogrel (PLAVIX) 75 MG tablet Take 1 tablet (75 mg total) by mouth daily.   empagliflozin (JARDIANCE) 10 MG TABS tablet Take 1 tablet (10 mg total) by mouth daily before breakfast.   fenofibrate 160 MG tablet TAKE 1 TABLET BY MOUTH ONCE DAILY. (Patient taking differently: Take 160 mg by mouth daily.)   furosemide (LASIX) 40 MG tablet Take 1 tablet (40 mg total) by mouth daily. Take daily or as directed   glucagon (GLUCAGON EMERGENCY) 1 MG injection Inject 1 mg into the vein once as  needed. (Patient not taking: Reported on 08/27/2021)   hydrALAZINE (APRESOLINE) 25 MG tablet Take 1 & 1/2 tablets (37.5 mg total) by mouth 3 (three) times daily.   insulin glargine-yfgn (SEMGLEE) 100 UNIT/ML Pen Inject 80 units into the skin daily (Patient taking differently: Inject 40 Units into the skin 2 (two) times daily. 35 units qam & 28 units qhs)   Insulin Pen Needle 31G X 5 MM MISC Use to inject novolog and semglee as directed   isosorbide mononitrate (IMDUR) 30 MG 24 hr tablet Take 1 tablet (30 mg total) by mouth daily.   levothyroxine (SYNTHROID) 200 MCG tablet Take 1 tablet (200 mcg total) by mouth daily at 6 (six) AM.   metoprolol succinate (TOPROL-XL) 25 MG 24 hr tablet Take 1 tablet (25 mg total) by mouth daily.   nitroGLYCERIN (NITROSTAT) 0.4 MG SL tablet Place 1 tablet (0.4 mg total) under the tongue every 5 (five) minutes x 3 doses as needed for chest pain.   NOVOLOG FLEXPEN 100 UNIT/ML FlexPen Inject 2-10 Units into the skin 3 (three) times daily with meals.   pantoprazole (PROTONIX) 40 MG tablet Take 1 tablet (40 mg total) by mouth daily as needed for acid reflux   potassium chloride SA (KLOR-CON M) 20 MEQ tablet Take 1 tablet (20 mEq total) by mouth daily   [DISCONTINUED] amoxicillin-clavulanate (AUGMENTIN) 875-125 MG tablet Take 1 tablet by mouth 2 (two) times daily.   [DISCONTINUED] Continuous Blood Gluc Sensor (FREESTYLE LIBRE 2 SENSOR) MISC Use to check blood sugar   [DISCONTINUED] insulin glargine-yfgn (SEMGLEE, YFGN,) 100 UNIT/ML Pen INJECT UP TO 80 UNITS UNDER THE SKIN ONCE DAILY.   [DISCONTINUED] mupirocin ointment (BACTROBAN) 2 % Apply 1 application topically 2 (two) times daily. (Patient not taking: Reported on 09/24/2021)   No facility-administered encounter medications on file as of 09/29/2021.    ALLERGIES: Allergies  Allergen Reactions   Wellbutrin [Bupropion] Hives   Cefepime Rash    Tolerates penicilllin   Ciprofloxacin Hcl Hives and Rash    Hives/rash at  injection site    Tape Rash    VACCINATION STATUS: Immunization History  Administered Date(s) Administered   Moderna Sars-Covid-2 Vaccination 09/23/2019, 10/28/2019    Diabetes She presents for her initial diabetic visit. She has type 1 diabetes mellitus. Onset time: diagnosed at age of 62. Her  disease course has been fluctuating. Hypoglycemia symptoms include sweats and tremors. Pertinent negatives for hypoglycemia include no nervousness/anxiousness. Associated symptoms include blurred vision, fatigue, polydipsia, polyuria and weight loss. Hypoglycemia complications include nocturnal hypoglycemia, required assistance and required glucagon injection. Symptoms are stable. Diabetic complications include a CVA, heart disease, nephropathy and PVD. Risk factors for coronary artery disease include diabetes mellitus, dyslipidemia, family history, obesity, hypertension and tobacco exposure. Current diabetic treatment includes oral agent (monotherapy) and intensive insulin program (Jardiance 10 mg prescribed by cardiologist). She is compliant with treatment most of the time. Her weight is decreasing steadily. She is following a generally unhealthy diet. When asked about meal planning, she reported none. She has not had a previous visit with a dietitian. She participates in exercise intermittently. (She presents today for her consultation, with no meter or logs to review.  Her POCT A1c today is 9.6%, improving slightly from last visit of 9.8%.  She was recently started on Jardiance by her cardiologist for CHF due to decline in kidney function but has not yet picked it up as she is concerned with cost.  She monitors glucose 3 times daily (used to have CGM but no longer has one).  She drinks mostly diet soda and some SF flavored water, eats 2 meals per day with rare snacks.  She does not engage in routine physical activity at this time due to recent great toe amputation.  She is UTD on eye exam, has seen podiatry in  the past.  She used to be on a pump a long time ago and ideally would like to go back to using one.) An ACE inhibitor/angiotensin II receptor blocker is not being taken. She does not see a podiatrist.Eye exam is current.  Hypertension This is a chronic problem. The current episode started more than 1 year ago. The problem has been resolved since onset. The problem is controlled. Associated symptoms include blurred vision and sweats. Agents associated with hypertension include thyroid hormones. Risk factors for coronary artery disease include diabetes mellitus, dyslipidemia, family history and smoking/tobacco exposure. Past treatments include beta blockers, diuretics and direct vasodilators. The current treatment provides moderate improvement. Compliance problems include diet and exercise.  Hypertensive end-organ damage includes kidney disease, CAD/MI, CVA, heart failure and PVD. Identifiable causes of hypertension include chronic renal disease and a thyroid problem.  Hyperlipidemia This is a chronic problem. The current episode started more than 1 year ago. The problem is controlled. Recent lipid tests were reviewed and are normal. Exacerbating diseases include chronic renal disease, diabetes, hypothyroidism and obesity. Factors aggravating her hyperlipidemia include beta blockers and fatty foods. Current antihyperlipidemic treatment includes statins and fibric acid derivatives. The current treatment provides moderate improvement of lipids. Compliance problems include adherence to diet and adherence to exercise.  Risk factors for coronary artery disease include diabetes mellitus, dyslipidemia, family history, obesity and hypertension.  Thyroid Problem Presents for initial visit. Symptoms include fatigue, tremors and weight loss. Patient reports no anxiety. The symptoms have been stable. Past treatments include levothyroxine. Her past medical history is significant for diabetes, heart failure, hyperlipidemia  and obesity. Risk factors include family history of hypothyroidism.    Review of systems  Constitutional: + steadily decreasing body weight, current Body mass index is 34.61 kg/m., + fatigue, no subjective hyperthermia, no subjective hypothermia Eyes: + blurry vision, no xerophthalmia ENT: no sore throat, no nodules palpated in throat, no dysphagia/odynophagia, no hoarseness Cardiovascular: no chest pain, no shortness of breath, no palpitations, no leg swelling Respiratory: no  cough, no shortness of breath Gastrointestinal: no nausea/vomiting/diarrhea Musculoskeletal: no muscle/joint aches Skin: no rashes, no hyperemia Neurological: no tremors, no numbness, no tingling, no dizziness Psychiatric: no depression, no anxiety  Objective:     BP 132/88   Pulse 87   Ht 5\' 5"  (1.651 m)   Wt 208 lb (94.3 kg)   BMI 34.61 kg/m   Wt Readings from Last 3 Encounters:  09/29/21 208 lb (94.3 kg)  09/24/21 202 lb (91.6 kg)  09/05/21 218 lb 4.1 oz (99 kg)     BP Readings from Last 3 Encounters:  09/29/21 132/88  09/24/21 118/78  09/05/21 135/80     Physical Exam- Limited  Constitutional:  Body mass index is 34.61 kg/m. , not in acute distress, normal state of mind Eyes:  EOMI, no exophthalmos Neck: Supple Cardiovascular: RRR, no murmurs, rubs, or gallops, no edema Respiratory: Adequate breathing efforts, no crackles, rales, rhonchi, or wheezing Musculoskeletal: no gross deformities, strength intact in all four extremities, no gross restriction of joint movements Skin:  no rashes, no hyperemia Neurological: no tremor with outstretched hands    CMP ( most recent) CMP     Component Value Date/Time   NA 138 09/04/2021 0330   NA 135 11/17/2015 1153   K 3.9 09/04/2021 0330   CL 109 09/04/2021 0330   CO2 25 09/04/2021 0330   GLUCOSE 200 (H) 09/04/2021 0330   BUN 26 (H) 09/04/2021 0330   BUN 29 (H) 11/17/2015 1153   CREATININE 2.22 (H) 09/04/2021 0330   CALCIUM 7.9 (L)  09/04/2021 0330   PROT 5.7 (L) 08/28/2021 0343   ALBUMIN 2.0 (L) 08/28/2021 0343   AST 11 (L) 08/28/2021 0343   ALT 13 08/28/2021 0343   ALKPHOS 119 08/28/2021 0343   BILITOT 0.7 08/28/2021 0343   GFRNONAA 28 (L) 09/04/2021 0330   GFRAA 31 (L) 02/01/2020 1328     Diabetic Labs (most recent): Lab Results  Component Value Date   HGBA1C 9.6 09/29/2021   HGBA1C 9.8 (H) 06/19/2021   HGBA1C 9.5 (H) 06/13/2020     Lipid Panel ( most recent) Lipid Panel     Component Value Date/Time   CHOL 100 06/13/2020 1043   TRIG 109 06/13/2020 1043   HDL 23 (L) 06/13/2020 1043   CHOLHDL 4.3 06/13/2020 1043   VLDL 22 06/13/2020 1043   LDLCALC 55 06/13/2020 1043   LDLDIRECT 84.9 01/01/2020 0319      Lab Results  Component Value Date   TSH 25.117 (H) 08/28/2021   TSH 0.268 (L) 06/13/2020   TSH 5.860 (H) 11/17/2015   TSH 33.130 (H) 07/21/2015   TSH 40.980 (H) 03/07/2015   FREET4 1.52 11/17/2015   FREET4 0.73 (L) 07/21/2015   FREET4 0.77 (L) 03/07/2015           Assessment & Plan:   1) Type 1 diabetes mellitus with hyperglycemia ([place])  She presents today for her consultation, with no meter or logs to review.  Her POCT A1c today is 9.6%, improving slightly from last visit of 9.8%.  She was recently started on Jardiance by her cardiologist for CHF due to decline in kidney function but has not yet picked it up as she is concerned with cost.  She monitors glucose 3 times daily (used to have CGM but no longer has one).  She drinks mostly diet soda and some SF flavored water, eats 2 meals per day with rare snacks.  She does not engage in routine physical activity at this  time due to recent great toe amputation.  She is UTD on eye exam, has seen podiatry in the past.  She used to be on a pump a long time ago and ideally would like to go back to using one.  - Lauren [name] has currently uncontrolled symptomatic type 1 DM since 41 years of age, with most recent A1c of 9.6 %.   -Recent labs  reviewed.  - I had a long discussion with her about the progressive nature of diabetes and the pathology behind its complications. -her diabetes is complicated by CAD, CVA, CKD, PVD, and CHF and she remains at a high risk for more acute and chronic complications which include CAD, CVA, CKD, retinopathy, and neuropathy. These are all discussed in detail with her.  The following Lifestyle Medicine recommendations according to [facility]) were discussed and offered to patient and she agrees to start the journey:  A. Whole Foods, Plant-based plate comprising of fruits and vegetables, plant-based proteins, whole-grain carbohydrates was discussed in detail with the patient.   A list for source of those nutrients were also provided to the patient.  Patient will use only water or unsweetened tea for hydration. B.  The need to stay away from risky substances including alcohol, smoking; obtaining 7 to 9 hours of restorative sleep, at least 150 minutes of moderate intensity exercise weekly, the importance of healthy social connections,  and stress reduction techniques were discussed. C.  A full color page of  Calorie density of various food groups per pound showing examples of each food groups was provided to the patient.  - I have counseled her on diet and weight management by adopting a carbohydrate restricted/protein rich diet. Patient is encouraged to switch to unprocessed or minimally processed complex starch and increased protein intake (animal or plant source), fruits, and vegetables. -  she is advised to stick to a routine mealtimes to eat 3 meals a day and avoid unnecessary snacks (to snack only to correct hypoglycemia).   - she acknowledges that there is a room for improvement in her food and drink choices. - Suggestion is made for her to avoid simple carbohydrates from her diet including Cakes, Sweet Desserts, Ice Cream, Soda (diet and regular), Sweet Tea, Candies,  Chips, Cookies, Store Bought Juices, Alcohol in Excess of 1-2 drinks a day, Artificial Sweeteners, Coffee Creamer, and "Sugar-free" Products. This will help patient to have more stable blood glucose profile and potentially avoid unintended weight gain.  - I have approached her with the following individualized plan to manage her diabetes and patient agrees:   Given her type 1 diagnosis, insulin is the only effective evidence-based treatment option.  She is advised to lower her dose of Semglee to 32 units SQ nightly (instead of higher dose BID), and adjust her Novolog to 2-8 units TID with meals if glucose is above 90 and she is eating (Specific instructions on how to titrate insulin dosage based on glucose readings given to patient in writing).  She can continue her Jardiance 10 mg po daily as prescribed by her cardiologist although it is not likely to help much with glucose management given her type 1 diagnosis.  -she is encouraged to start monitoring glucose 4 times daily, before meals and before bed, to log their readings on the clinic sheets provided, and bring them to review at follow up appointment in 2 weeks.  She could greatly benefit from CGM device, I sent in for Dexcom G6 to her  local pharmacy.  I am considering her for Omnipod 5 in the future which she is open to.  - she is warned not to take insulin without proper monitoring per orders. - Adjustment parameters are given to her for hypo and hyperglycemia in writing. - she is encouraged to call clinic for blood glucose levels less than 70 or above 300 mg /dl.  - Specific targets for  A1c; LDL, HDL, and Triglycerides were discussed with the patient.  2) Blood Pressure /Hypertension:  her blood pressure is controlled to target.   she is advised to continue her current medications including Lasix 40 mg po daily, Hydralazine 37.5 mg po  mg p.o TID, and Metoprolol 25 mg po daily.  3) Lipids/Hyperlipidemia:    Review of her recent lipid  panel from 06/13/20 showed controlled LDL at 55 .  she is advised to continue Lipitor 40 mg daily at bedtime and Fenofibrate 160 mg po daily.  Side effects and precautions discussed with her.  4)  Weight/Diet:  her Body mass index is 34.61 kg/m.  -  clearly complicating her diabetes care.   she is a candidate for weight loss. I discussed with her the fact that loss of 5 - 10% of her  current body weight will have the most impact on her diabetes management.  Exercise, and detailed carbohydrates information provided  -  detailed on discharge instructions.  5) Hypothyroidism-unspecified The details surrounding her diagnosis are unavailable.  She is currently on Levothyroxine 200 mcg po daily.  Her most recent thyroid function tests are consistent with under-replacement.  Will repeat thyroid function tests on subsequent visits and adjust dose if necessary.   - The correct intake of thyroid hormone (Levothyroxine, Synthroid), is on empty stomach first thing in the morning, with water, separated by at least 30 minutes from breakfast and other medications,  and separated by more than 4 hours from calcium, iron, multivitamins, acid reflux medications (PPIs).  - This medication is a life-long medication and will be needed to correct thyroid hormone imbalances for the rest of your life.  The dose may change from time to time, based on thyroid blood work.  - It is extremely important to be consistent taking this medication, near the same time each morning.  -AVOID TAKING PRODUCTS CONTAINING BIOTIN (commonly found in Hair, Skin, Nails vitamins) AS IT INTERFERES WITH THE VALIDITY OF THYROID FUNCTION BLOOD TESTS.  6) Chronic Care/Health Maintenance: -she is not on ACEI/ARB and is on Statin medications and is encouraged to initiate and continue to follow up with Ophthalmology, Dentist, Podiatrist at least yearly or according to recommendations, and advised to stay away from smoking. I have recommended yearly flu  vaccine and pneumonia vaccine at least every 5 years; moderate intensity exercise for up to 150 minutes weekly; and sleep for at least 7 hours a day.  - she is advised to maintain close follow up with Lemmie Evens, MD for primary care needs, as well as her other providers for optimal and coordinated care.   - Time spent in this patient care: 60 min, of which > 50% was spent in counseling her about her diabetes and the rest reviewing her blood glucose logs, discussing her hypoglycemia and hyperglycemia episodes, reviewing her current and previous labs/studies (including abstraction from other facilities) and medications doses and developing a long term treatment plan based on the latest standards of care/guidelines; and documenting her care.    Please refer to Patient Instructions for Blood Glucose Monitoring and  Insulin/Medications Dosing Guide" in media tab for additional information. Please also refer to "Patient Self Inventory" in the Media tab for reviewed elements of pertinent patient history.  Lauren [name] participated in the discussions, expressed understanding, and voiced agreement with the above plans.  All questions were answered to her satisfaction. she is encouraged to contact clinic should she have any questions or concerns prior to her return visit.     Follow up plan: - Return in about 2 weeks (around 10/13/2021) for Diabetes F/U, Bring meter and logs.    Rayetta Pigg, [facility] [address] [place], [place] [address] Phone: [phone] Fax: [phone]  09/29/2021, 12:02 PM

## 2021-09-29 NOTE — Patient Instructions (Signed)

## 2021-10-05 ENCOUNTER — Ambulatory Visit (INDEPENDENT_AMBULATORY_CARE_PROVIDER_SITE_OTHER): Payer: No Typology Code available for payment source | Admitting: Podiatry

## 2021-10-05 DIAGNOSIS — L97422 Non-pressure chronic ulcer of left heel and midfoot with fat layer exposed: Secondary | ICD-10-CM | POA: Diagnosis not present

## 2021-10-05 DIAGNOSIS — E10621 Type 1 diabetes mellitus with foot ulcer: Secondary | ICD-10-CM

## 2021-10-05 NOTE — Progress Notes (Signed)
  Subjective:  Patient ID: Lauren [name], female    DOB: 1981-03-19,  MRN: [id number]    DOS: 09/04/2021 Procedure: Partial fifth ray amputation and closure  41 y.o. female returns for post-op check.  Has still been changing the bandage at home with Prisma  Review of Systems: Negative except as noted in the HPI. Denies N/V/F/Ch.   Objective:  There were no vitals filed for this visit. There is no height or weight on file to calculate BMI. Constitutional Well developed. Well nourished.  Vascular Foot warm and well perfused. Capillary refill normal to all digits.  Calf is soft and supple, no posterior calf or knee pain, negative Homans' sign  Neurologic Normal speech. Oriented to person, place, and time. Epicritic sensation to light touch grossly present bilaterally.  Dermatologic Has continued delayed healing with residual ulceration measures 4.0 x 0.4 x 0.3 cm distally and 1.0 x 0.3 x 0.3 proximally.  Improved serous drainage no signs of infection  Orthopedic: There is little to no tenderness to palpation noted about the surgical site.      Assessment:   1. 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  Ulcer left foot -We discussed the etiology and factors that are a part of the wound healing process.  We also discussed the risk of infection both soft tissue and osteomyelitis from open ulceration.  Discussed the risk of limb loss if this happens or worsens. -Debridement as below. -Dressed with Prisma, DSD. -Continue home dressing changes  3 times weekly with 4 x 4 gauze and Prisma -Continue off-loading with surgical shoe. -Encourage strict glycemic control to continue -Last antibiotics: I do not think she needs continued antibiotics currently -I recommend she continue her leave from work for now.  Expect this to continue for at least another  month.  If this continues to improve she may be able to go back on a reduced schedule for half shifts but I do not think she would be best to be on her foot 12 hours of time right now  Procedure: Excisional Debridement of Wound Rationale: Removal of non-viable soft tissue from the wound to promote healing.  Anesthesia: none Post-Debridement Wound Measurements: 4.0 x 0.4 x 0.3 cm distally and 1.0 x 0.3 x 0.3 proximally Type of Debridement: Sharp Excisional Tissue Removed: Non-viable soft tissue Depth of Debridement: subcutaneous tissue. Technique: Sharp excisional debridement to bleeding, viable wound base.  Dressing: Dry, sterile, compression dressing. Disposition: Patient tolerated procedure well.       Return in about 2 weeks (around 10/19/2021) for wound care.

## 2021-10-06 DIAGNOSIS — M79676 Pain in unspecified toe(s): Secondary | ICD-10-CM

## 2021-10-09 ENCOUNTER — Other Ambulatory Visit: Payer: Self-pay | Admitting: *Deleted

## 2021-10-09 NOTE — Patient Outreach (Signed)
[facility]) Care Management  10/09/2021  Lauren [name] Dec 07, 1980 [id number]   [facility] Unsuccessful outreach #2 Outreach attempt to the listed at the preferred outreach number in EPIC  No answer. [facility] RN CM left HIPAA [facility] Portability and Accountability Act) compliant voicemail message along with CM's contact info.       Plan: [facility] RN CM scheduled this patient for another call attempt within 4-7 business days Unsuccessful outreach letter sent on 10/09/21 Unsuccessful outreach on 09/10/21, 10/09/21   Joelene Millin L. Lavina Hamman, RN, BSN, [place] Coordinator Office number [phone]

## 2021-10-12 ENCOUNTER — Other Ambulatory Visit: Payer: Self-pay | Admitting: *Deleted

## 2021-10-12 NOTE — Patient Outreach (Signed)
[facility]) Care Management Telephonic RN Care Manager Note   10/19/2021 Name:  Lauren [name] MRN:  [id number] DOB:  12-20-1980  Summary: Successful outreach, initial assessment, assist to call [facility] about a false outreach and mail order Left foot ulcer healing slow staples and sutures out now 2 sections open and packing healing Work July 20th PMH MI CVA  Dr Sherryle Lis following her  Cbg values now seeing endocrinologist change insulin level flu  A1c 9.8 in February 2023 now 9.6 endo to get on insulin pump dexcom   Diabetes type 1   She reports being diagnosed with Diabetes type 1 since age 43  She reports she had been followed by her primary care provider (PCP) primarily but now is seeing endocrinologist  She confirms the loss of her    Medications Interested in mail order  Meds $200 [employer] in mail on Sunday CV sent coupon for $10  Dexacom sensor on tier 3 $189/90 days after MD approval Semgles  $366.63 Aware of [email or web address]  Activity Get in stand in shower with assistance  social determinants of health ([place]) Out of work Getting short term disability Received a letter from her Job about a elevated charge  Premium last due due by 09/30/21, 11/06/21 Discussed [employer] options and referred her to her supervisor/manager [employer] as of January 2023  Call to matrix [phone] Toll-Free: [phone]  Recommendations/Changes made from today's visit:  Reminded of appointment with endocrinology and  Discussed protein drinks, Premier, boost, [employer] with pt to [employer] member care [phone] Mail order Medipact/Birdi Staff answered pt questions about a recent call requesting a Birth certificate for daughter, [name], to be covered Never call to request pt birth certificate- scam call   Spoke with Evely Fax [place] pay 90 day supply $320.38 She gave the numbers for the coupon  card  [email or web address] discussed   Subjective: Lauren [name] is an 41 y.o. year old female who is a primary patient of Lemmie Evens, MD. The care management team was consulted for assistance with care management and/or care coordination needs.    Telephonic RN Care Manager completed Telephone Visit today.   Lauren [name] was assign to Central Team RN CM on 09/15/21 for post hospital Care Management, diabetes management, wound follow up  Objective:  Medications Reviewed Today     Reviewed by Brita Romp, NP (Nurse Practitioner) on 10/19/21 at [phone]  Med List Status: <None>   Medication Order Taking? Sig Documenting Provider Last Dose Status Informant  acetaminophen (TYLENOL) 500 MG tablet [id number]  Take 1,500 mg by mouth every 6 (six) hours as needed for moderate pain or headache. [provider]  Active Self  atorvastatin (LIPITOR) 40 MG tablet [id number]  TAKE 1 TABLET BY MOUTH ONCE DAILY. Barrett, Evelene Croon, PA-C  Active   clopidogrel (PLAVIX) 75 MG tablet [id number]  Take 1 tablet (75 mg total) by mouth daily. Barrett, Evelene Croon, PA-C  Active   Continuous Blood Gluc Sensor (DEXCOM G6 SENSOR) MISC [id number]  Change sensor every 10 days as directed Brita Romp, NP  Active   Continuous Blood Gluc Transmit (DEXCOM G6 TRANSMITTER) MISC [id number]  Change transmitter every 90 days as directed. Brita Romp, NP  Active   fenofibrate 160 MG tablet [id number]  TAKE 1 TABLET BY MOUTH ONCE DAILY.  Patient taking differently: Take 160 mg by mouth daily.   Arnoldo Lenis, MD  Active Self  furosemide (LASIX) 40 MG tablet [id number]  Take 1 tablet (40 mg total) by mouth daily. Take daily or as directed Barrett, Evelene Croon, PA-C  Active   hydrALAZINE (APRESOLINE) 25 MG tablet [id number]  Take 1 & 1/2 tablets (37.5 mg total) by mouth 3 (three) times daily. Arnoldo Lenis, MD  Active Self  Insulin Disposable Pump (OMNIPOD 5 G6 INTRO, GEN 5,) KIT [id number] Yes Change pod every 48-72  hrs Brita Romp, NP  Active   Insulin Disposable Pump (OMNIPOD 5 G6 POD, GEN 5,) MISC [id number] Yes Change pod every 48-72 hours Brita Romp, NP  Active   insulin glargine-yfgn (SEMGLEE) 100 UNIT/ML Pen [id number]  Inject 34 Units into the skin at bedtime. Brita Romp, NP  Active   Insulin Pen Needle 31G X 5 MM MISC [id number]  Use to inject novolog and semglee as directed   Active Self  isosorbide mononitrate (IMDUR) 30 MG 24 hr tablet [id number]  Take 1 tablet (30 mg total) by mouth daily. Barrett, Evelene Croon, PA-C  Active   levothyroxine (SYNTHROID) 200 MCG tablet [id number]  Take 1 tablet (200 mcg total) by mouth daily at 6 (six) AM. Darliss Cheney, MD  Expired 10/06/21 2359   metoprolol succinate (TOPROL-XL) 25 MG 24 hr tablet [id number]  Take 1 tablet (25 mg total) by mouth daily. Barrett, Evelene Croon, PA-C  Active   nitroGLYCERIN (NITROSTAT) 0.4 MG SL tablet [id number]  Place 1 tablet (0.4 mg total) under the tongue every 5 (five) minutes x 3 doses as needed for chest pain. Barrett, Felisa Bonier  Active   NOVOLOG FLEXPEN 100 UNIT/ML FlexPen [id number]  Inject 4-10 Units into the skin 3 (three) times daily with meals. [provider]  Active Self  pantoprazole (PROTONIX) 40 MG tablet [id number]  Take 1 tablet (40 mg total) by mouth daily as needed for acid reflux   Active Self  potassium chloride SA (KLOR-CON M) 20 MEQ tablet [id number]  Take 1 tablet (20 mEq total) by mouth daily Barrett, Evelene Croon, PA-C  Active              SDOH:  (Social Determinants of Health) assessments and interventions performed:    Care Plan  Review of patient past medical history, allergies, medications, health status, including review of consultants reports, laboratory and other test data, was performed as part of comprehensive evaluation for care management services.   Care Plan : RN Care Manager Plan of Care  Updates made by Barbaraann Faster, RN since 10/19/2021 12:00 AM     Problem:  Complex Care Coordination Needs and disease management in patient with DM I, wound   Priority: High  Onset Date: 10/12/2021     Long-Range Goal: Establish Plan of Care for Management Complex SDOH Barriers, disease management and Care Coordination   Start Date: 10/12/2021  This Visit's Progress: On track  Priority: High  Note:   Current Barriers:  Knowledge Deficits related to plan of care for management of DM I , wound  Care Coordination needs related to Lacks knowledge of community resource: mail order   RN CM Clinical Goal(s):  Patient will verbalize understanding of plan for management of DM I, wound management as evidenced by improved CBG values and verbalization of wound healing   through collaboration with RN Care manager, provider, and care team.   Interventions: Outreaches for care coordination, disease management, resources, home care/education needs Inter-disciplinary care team collaboration (see longitudinal plan of care) Evaluation of current treatment  plan related to  self management and patient's adherence to plan as established by provider   Interdisciplinary Collaboration Interventions:  (Status: New goal.) Short Term Goal   Collaborated with RN CM to initiate plan of care to address needs related to Lacks knowledge of community resource: medication mail order services, ST/LT disability  in patient with  DM type I, Wounds 10/12/21 Conference with pt to [facility] member care [phone] for Mail order Medipact/Birdi Staff answered pt questions about a recent call requesting a Birth certificate for daughter, [name], to be covered---Never call to request pt birth certificate- scam call Provided with matrix [phone] Toll-Free: [phone] Spoke with Evely Fax [place] pay 90 day supply $320.38 She gave the numbers for the coupon card Goodrx.com discussed    Diabetes type I Interventions:  (Status:  New goal.) Long Term Goal Assessed patient's  understanding of A1c goal: <7% Provided education to patient about basic DM disease process Reviewed medications with patient and discussed importance of medication adherence Counseled on importance of regular laboratory monitoring as prescribed Discussed plans with patient for ongoing care management follow up and provided patient with direct contact information for care management team Review of patient status, including review of consultants reports, relevant laboratory and other test results, and medications completed Screening for signs and symptoms of depression related to chronic disease state  Assessed social determinant of health barriers 10/12/21 Reminded of appointment with endocrinology and  Discussed protein drinks, Premier, boost, Glucerna Lab Results  Component Value Date   HGBA1C 9.6 09/29/2021   Patient Goals/Self-Care Activities: Take all medications as prescribed Attend all scheduled provider appointments Call pharmacy for medication refills 3-7 days in advance of running out of medications Attend church or other social activities Perform all self care activities independently  Perform IADL's (shopping, preparing meals, housekeeping, managing finances) independently Call provider office for new concerns or questions   Follow Up Plan:  The patient has been provided with contact information for the care management team and has been advised to call with any health related questions or concerns.  The care management team will reach out to the patient again over the next 30+ business days.       Plan: The patient has been provided with contact information for the care management team and has been advised to call with any health related questions or concerns.  The care management team will reach out to the patient again over the next 30+ business days.  Daaiel Starlin L. Lavina Hamman, RN, BSN, [place] Coordinator Office number [phone] [facility]  number [phone] Fax number [phone]

## 2021-10-15 ENCOUNTER — Ambulatory Visit: Payer: No Typology Code available for payment source | Admitting: Cardiology

## 2021-10-16 ENCOUNTER — Ambulatory Visit: Payer: No Typology Code available for payment source | Admitting: Cardiology

## 2021-10-19 ENCOUNTER — Encounter: Payer: Self-pay | Admitting: Nurse Practitioner

## 2021-10-19 ENCOUNTER — Ambulatory Visit: Payer: No Typology Code available for payment source | Admitting: Nurse Practitioner

## 2021-10-19 VITALS — BP 123/85 | HR 89 | Ht 65.0 in | Wt 200.0 lb

## 2021-10-19 DIAGNOSIS — I10 Essential (primary) hypertension: Secondary | ICD-10-CM

## 2021-10-19 DIAGNOSIS — E039 Hypothyroidism, unspecified: Secondary | ICD-10-CM

## 2021-10-19 DIAGNOSIS — E1065 Type 1 diabetes mellitus with hyperglycemia: Secondary | ICD-10-CM | POA: Diagnosis not present

## 2021-10-19 DIAGNOSIS — E782 Mixed hyperlipidemia: Secondary | ICD-10-CM | POA: Diagnosis not present

## 2021-10-19 LAB — POCT UA - MICROALBUMIN
Albumin/Creatinine Ratio, Urine, POC: >300
Creatinine, POC: 100 mg/dL
Microalbumin Ur, POC: 150 mg/L

## 2021-10-19 MED ORDER — INSULIN GLARGINE-YFGN 100 UNIT/ML ~~LOC~~ SOPN: 34.0000 [IU] | PEN_INJECTOR | Freq: Every day | SUBCUTANEOUS | 0 refills | Status: DC

## 2021-10-19 MED ORDER — OMNIPOD 5 DEXG7G6 PODS GEN 5 MISC
6 refills | Status: DC
  Filled 2021-11-06: qty 25, 45d supply, fill #0
  Filled 2021-11-09: qty 25, 30d supply, fill #0
  Filled 2021-11-13: qty 25, 50d supply, fill #0
  Filled 2021-11-25: qty 15, 30d supply, fill #0

## 2021-10-19 MED ORDER — OMNIPOD 5 DEXG7G6 INTRO GEN 5 KIT
PACK | 0 refills | Status: DC
Start: 2021-10-19 — End: 2022-02-19
  Filled 2021-11-06: qty 1, 15d supply, fill #0
  Filled 2021-11-09: qty 1, 10d supply, fill #0

## 2021-10-19 NOTE — Progress Notes (Signed)
Endocrinology Follow Up Note       10/19/2021, 9:40 AM   Subjective:    Patient ID: Lauren [name], female    DOB: [birth year].  Lauren [name] is being seen in follow up after being seen in consultation for management of currently uncontrolled symptomatic diabetes requested by  Lemmie Evens, MD.   Past Medical History:  Diagnosis Date   Anemia    CAD (coronary artery disease)    a. s/p cath in 03/2014 showing 30% mid-LAD, moderate to severe disease along small D1, patent LCx, moderate to severe distal OM2 stenosis and moderate diffuse diease along RCA not amenable to PCI   CHF (congestive heart failure) ([place])    a. EF 55-60% in 12/2019 b. EF at 35-40% by echo in 05/2020   Diabetes mellitus without complication ([place])    Myocardial infarction ([place])    Neuropathy    Renal disorder    stage 3 kidney disease   Stroke [facility])     Past Surgical History:  Procedure Laterality Date   AMPUTATION Left 09/02/2021   Procedure: AMPUTATION RAY;  Surgeon: Criselda Peaches, DPM;  Location: [place];  Service: Podiatry;  Laterality: Left;  sagittal saw, 3L bag saline & Pulse   Cardiac catherization     CHOLECYSTECTOMY     I & D EXTREMITY Left 08/30/2021   Procedure: IRRIGATION AND DEBRIDEMENT WITH BONE BIOPSY;  Surgeon: [name], DPM;  Location: [place];  Service: Podiatry;  Laterality: Left;   IRRIGATION AND DEBRIDEMENT FOOT Left 09/04/2021   Procedure: IRRIGATION AND DEBRIDEMENT FOOT AND CLOSURE;  Surgeon: Criselda Peaches, DPM;  Location: [place];  Service: Podiatry;  Laterality: Left;   TUBAL LIGATION      Social History   Socioeconomic History   Marital status: Married    Spouse name: [name]   Number of children: Not on file   Years of education: Not on file   Highest education level: Not on file  Occupational History    Comment: Full time  Tobacco Use   Smoking status: Former    Packs/day: 0.25    Types:  Cigarettes    Quit date: 10/27/2020    Years since quitting: 0.9   Smokeless tobacco: Never  Vaping Use   Vaping Use: Never used  Substance and Sexual Activity   Alcohol use: No    Alcohol/week: 0.0 standard drinks of alcohol   Drug use: No   Sexual activity: Yes    Birth control/protection: Surgical    Comment: tubal   Other Topics Concern   Not on file  Social History Narrative   Lives with husband and kids   Right handed   Drinks 9+ cups caffeine daily   Social Determinants of Health   Financial Resource Strain: Medium Risk (07/23/2020)   Overall Financial Resource Strain (CARDIA)    Difficulty of Paying Living Expenses: Somewhat hard  Food Insecurity: Food Insecurity Present (07/23/2020)   Hunger Vital Sign    Worried About Running Out of Food in the Last Year: Sometimes true    Ran Out of Food in the Last Year: Sometimes true  Transportation Needs: No Transportation Needs (07/23/2020)   [place] -  Hydrologist (Medical): No    Lack of Transportation (Non-Medical): No  Physical Activity: Insufficiently Active (07/23/2020)   Exercise Vital Sign    Days of Exercise per Week: 1 day    Minutes of Exercise per Session: 30 min  Stress: No Stress Concern Present (07/23/2020)   [place]    Feeling of Stress : Not at all  Social Connections: Moderately Integrated (07/23/2020)   Social Connection and Isolation Panel [NHANES]    Frequency of Communication with Friends and Family: More than three times a week    Frequency of Social Gatherings with Friends and Family: Three times a week    Attends Religious Services: Never    Active Member of Clubs or Organizations: Yes    Attends Music therapist: More than 4 times per year    Marital Status: Married    Family History  Problem Relation Age of Onset   Thyroid disease Mother    Hypertension Mother    Hyperlipidemia Mother     CAD Mother    CVA Mother    Hyperlipidemia Father    Hypertension Father    CAD Father    Breast cancer Paternal Grandmother 70   Cancer Paternal Grandmother        lung    Outpatient Encounter Medications as of 10/19/2021  Medication Sig   Insulin Disposable Pump (OMNIPOD 5 G6 INTRO, GEN 5,) KIT Change pod every 48-72 hrs   Insulin Disposable Pump (OMNIPOD 5 G6 POD, GEN 5,) MISC Change pod every 48-72 hours   acetaminophen (TYLENOL) 500 MG tablet Take 1,500 mg by mouth every 6 (six) hours as needed for moderate pain or headache.   atorvastatin (LIPITOR) 40 MG tablet TAKE 1 TABLET BY MOUTH ONCE DAILY.   clopidogrel (PLAVIX) 75 MG tablet Take 1 tablet (75 mg total) by mouth daily.   Continuous Blood Gluc Sensor (DEXCOM G6 SENSOR) MISC Change sensor every 10 days as directed   Continuous Blood Gluc Transmit (DEXCOM G6 TRANSMITTER) MISC Change transmitter every 90 days as directed.   fenofibrate 160 MG tablet TAKE 1 TABLET BY MOUTH ONCE DAILY. (Patient taking differently: Take 160 mg by mouth daily.)   furosemide (LASIX) 40 MG tablet Take 1 tablet (40 mg total) by mouth daily. Take daily or as directed   hydrALAZINE (APRESOLINE) 25 MG tablet Take 1 & 1/2 tablets (37.5 mg total) by mouth 3 (three) times daily.   insulin glargine-yfgn (SEMGLEE) 100 UNIT/ML Pen Inject 34 Units into the skin at bedtime.   Insulin Pen Needle 31G X 5 MM MISC Use to inject novolog and semglee as directed   isosorbide mononitrate (IMDUR) 30 MG 24 hr tablet Take 1 tablet (30 mg total) by mouth daily.   levothyroxine (SYNTHROID) 200 MCG tablet Take 1 tablet (200 mcg total) by mouth daily at 6 (six) AM.   metoprolol succinate (TOPROL-XL) 25 MG 24 hr tablet Take 1 tablet (25 mg total) by mouth daily.   nitroGLYCERIN (NITROSTAT) 0.4 MG SL tablet Place 1 tablet (0.4 mg total) under the tongue every 5 (five) minutes x 3 doses as needed for chest pain.   NOVOLOG FLEXPEN 100 UNIT/ML FlexPen Inject 4-10 Units into the skin  3 (three) times daily with meals.   pantoprazole (PROTONIX) 40 MG tablet Take 1 tablet (40 mg total) by mouth daily as needed for acid reflux   potassium chloride SA (KLOR-CON M) 20 MEQ tablet  Take 1 tablet (20 mEq total) by mouth daily   [DISCONTINUED] empagliflozin (JARDIANCE) 10 MG TABS tablet Take 1 tablet (10 mg total) by mouth daily before breakfast. (Patient not taking: Reported on 10/19/2021)   [DISCONTINUED] empagliflozin (JARDIANCE) 10 MG TABS tablet Take by mouth.   [DISCONTINUED] furosemide (LASIX) 40 MG tablet Take by mouth.   [DISCONTINUED] glucagon (GLUCAGON EMERGENCY) 1 MG injection Inject 1 mg into the vein once as needed. (Patient not taking: Reported on 08/27/2021)   [DISCONTINUED] hydrALAZINE (APRESOLINE) 25 MG tablet Take by mouth.   [DISCONTINUED] insulin glargine-yfgn (SEMGLEE) 100 UNIT/ML Pen Inject 80 units into the skin daily (Patient taking differently: Inject 32 Units into the skin every morning.)   [DISCONTINUED] insulin glargine-yfgn (SEMGLEE) 100 UNIT/ML Pen Inject into the skin.   [DISCONTINUED] insulin lispro (HUMALOG) 100 UNIT/ML injection Inject into the skin.   [DISCONTINUED] levothyroxine (SYNTHROID) 175 MCG tablet Take by mouth.   [DISCONTINUED] pantoprazole (PROTONIX) 40 MG tablet Take by mouth.   [DISCONTINUED] potassium chloride SA (KLOR-CON M) 20 MEQ tablet Take by mouth.   No facility-administered encounter medications on file as of 10/19/2021.    ALLERGIES: Allergies  Allergen Reactions   Wellbutrin [Bupropion] Hives   Cefepime Rash    Tolerates penicilllin   Ciprofloxacin Hcl Hives and Rash    Hives/rash at injection site    Tape Rash    VACCINATION STATUS: Immunization History  Administered Date(s) Administered   Influenza-Unspecified 05/01/2021   Moderna Sars-Covid-2 Vaccination 09/23/2019, 10/28/2019    Diabetes She presents for her follow-up diabetic visit. She has type 1 diabetes mellitus. Onset time: diagnosed at age of 88. Her  disease course has been improving. There are no hypoglycemic associated symptoms. Pertinent negatives for hypoglycemia include no nervousness/anxiousness. Associated symptoms include blurred vision, fatigue, polydipsia, polyuria and weight loss. There are no hypoglycemic complications. Symptoms are improving. Diabetic complications include a CVA, heart disease, nephropathy and PVD. Risk factors for coronary artery disease include diabetes mellitus, dyslipidemia, family history, obesity, hypertension and tobacco exposure. Current diabetic treatment includes intensive insulin program (did not start Jardiance prescribed by cardiologist due to fear of SE and cost). She is compliant with treatment most of the time. Her weight is decreasing steadily. She is following a generally healthy diet. When asked about meal planning, she reported none. She has not had a previous visit with a dietitian. She participates in exercise intermittently. Her overall blood glucose range is >200 mg/dl. (She presents today with her CGM showing slowly improving glycemic profile with gross hyperglycemia overall.  She was not due for another A1c today.  Analysis of her CGM shows TIR 15%, TAR 85%, TBR 0% with a GMI of 9.5%.  She denies any hypoglycemia since adjusting her medications at last visit.  She did do research on Omnipod 5 and wants to proceed.) An ACE inhibitor/angiotensin II receptor blocker is not being taken. She does not see a podiatrist.Eye exam is current.  Hypertension This is a chronic problem. The current episode started more than 1 year ago. The problem has been resolved since onset. The problem is controlled. Associated symptoms include blurred vision. Agents associated with hypertension include thyroid hormones. Risk factors for coronary artery disease include diabetes mellitus, dyslipidemia, family history and smoking/tobacco exposure. Past treatments include beta blockers, diuretics and direct vasodilators. The current  treatment provides moderate improvement. Compliance problems include diet and exercise.  Hypertensive end-organ damage includes kidney disease, CAD/MI, CVA, heart failure and PVD. Identifiable causes of hypertension include chronic renal disease  and a thyroid problem.  Hyperlipidemia This is a chronic problem. The current episode started more than 1 year ago. The problem is controlled. Recent lipid tests were reviewed and are normal. Exacerbating diseases include chronic renal disease, diabetes, hypothyroidism and obesity. Factors aggravating her hyperlipidemia include beta blockers and fatty foods. Current antihyperlipidemic treatment includes statins and fibric acid derivatives. The current treatment provides moderate improvement of lipids. Compliance problems include adherence to diet and adherence to exercise.  Risk factors for coronary artery disease include diabetes mellitus, dyslipidemia, family history, obesity and hypertension.  Thyroid Problem Presents for initial visit. Symptoms include fatigue and weight loss. Patient reports no anxiety. The symptoms have been stable. Past treatments include levothyroxine. Her past medical history is significant for diabetes, heart failure, hyperlipidemia and obesity. Risk factors include family history of hypothyroidism.     Review of systems  Constitutional: + steadily decreasing body weight, current Body mass index is 33.28 kg/m., + fatigue, no subjective hyperthermia, no subjective hypothermia Eyes: + blurry vision, no xerophthalmia ENT: no sore throat, no nodules palpated in throat, no dysphagia/odynophagia, no hoarseness Cardiovascular: no chest pain, no shortness of breath, no palpitations, no leg swelling Respiratory: no cough, no shortness of breath Gastrointestinal: no nausea/vomiting/diarrhea Musculoskeletal: no muscle/joint aches Skin: no rashes, no hyperemia Neurological: no tremors, no numbness, no tingling, no dizziness Psychiatric: no  depression, no anxiety  Objective:     BP 123/85   Pulse 89   Ht 5' 5"  (1.651 m)   Wt 200 lb (90.7 kg)   BMI 33.28 kg/m   Wt Readings from Last 3 Encounters:  10/19/21 200 lb (90.7 kg)  09/29/21 208 lb (94.3 kg)  09/24/21 202 lb (91.6 kg)     BP Readings from Last 3 Encounters:  10/19/21 123/85  09/29/21 132/88  09/24/21 118/78      Physical Exam- Limited  Constitutional:  Body mass index is 33.28 kg/m. , not in acute distress, normal state of mind Eyes:  EOMI, no exophthalmos Neck: Supple Cardiovascular: RRR, no murmurs, rubs, or gallops, no edema Respiratory: Adequate breathing efforts, no crackles, rales, rhonchi, or wheezing Musculoskeletal: no gross deformities, strength intact in all four extremities, no gross restriction of joint movements Skin:  no rashes, no hyperemia Neurological: no tremor with outstretched hands    CMP ( most recent) CMP     Component Value Date/Time   NA 138 09/04/2021 0330   NA 135 11/17/2015 1153   K 3.9 09/04/2021 0330   CL 109 09/04/2021 0330   CO2 25 09/04/2021 0330   GLUCOSE 200 (H) 09/04/2021 0330   BUN 26 (H) 09/04/2021 0330   BUN 29 (H) 11/17/2015 1153   CREATININE 2.22 (H) 09/04/2021 0330   CALCIUM 7.9 (L) 09/04/2021 0330   PROT 5.7 (L) 08/28/2021 0343   ALBUMIN 2.0 (L) 08/28/2021 0343   AST 11 (L) 08/28/2021 0343   ALT 13 08/28/2021 0343   ALKPHOS 119 08/28/2021 0343   BILITOT 0.7 08/28/2021 0343   GFRNONAA 28 (L) 09/04/2021 0330   GFRAA 31 (L) 02/01/2020 1328     Diabetic Labs (most recent): Lab Results  Component Value Date   HGBA1C 9.6 09/29/2021   HGBA1C 9.8 (H) 06/19/2021   HGBA1C 9.5 (H) 06/13/2020   MICROALBUR 150 10/19/2021     Lipid Panel ( most recent) Lipid Panel     Component Value Date/Time   CHOL 100 06/13/2020 1043   TRIG 109 06/13/2020 1043   HDL 23 (L) 06/13/2020 1043  CHOLHDL 4.3 06/13/2020 1043   VLDL 22 06/13/2020 1043   LDLCALC 55 06/13/2020 1043   LDLDIRECT 84.9 01/01/2020  0319      Lab Results  Component Value Date   TSH 25.117 (H) 08/28/2021   TSH 0.268 (L) 06/13/2020   TSH 5.860 (H) 11/17/2015   TSH 33.130 (H) 07/21/2015   TSH 40.980 (H) 03/07/2015   FREET4 1.52 11/17/2015   FREET4 0.73 (L) 07/21/2015   FREET4 0.77 (L) 03/07/2015           Assessment & Plan:   1) Type 1 diabetes mellitus with hyperglycemia ([place])  She presents today with her CGM showing slowly improving glycemic profile with gross hyperglycemia overall.  She was not due for another A1c today.  Analysis of her CGM shows TIR 15%, TAR 85%, TBR 0% with a GMI of 9.5%.  She denies any hypoglycemia since adjusting her medications at last visit.  She did do research on Omnipod 5 and wants to proceed.  - Elanore Sarnowski has currently uncontrolled symptomatic type 1 DM since 41 years of age, with most recent A1c of 9.6 %.   -Recent labs reviewed.  - I had a long discussion with her about the progressive nature of diabetes and the pathology behind its complications. -her diabetes is complicated by CAD, CVA, CKD, PVD, and CHF and she remains at a high risk for more acute and chronic complications which include CAD, CVA, CKD, retinopathy, and neuropathy. These are all discussed in detail with her.  The following Lifestyle Medicine recommendations according to [facility]) were discussed and offered to patient and she agrees to start the journey:  A. Whole Foods, Plant-based plate comprising of fruits and vegetables, plant-based proteins, whole-grain carbohydrates was discussed in detail with the patient.   A list for source of those nutrients were also provided to the patient.  Patient will use only water or unsweetened tea for hydration. B.  The need to stay away from risky substances including alcohol, smoking; obtaining 7 to 9 hours of restorative sleep, at least 150 minutes of moderate intensity exercise weekly, the importance of healthy social connections,   and stress reduction techniques were discussed. C.  A full color page of  Calorie density of various food groups per pound showing examples of each food groups was provided to the patient.  - Nutritional counseling repeated at each appointment due to patients tendency to fall back in to old habits.  - The patient admits there is a room for improvement in their diet and drink choices. -  Suggestion is made for the patient to avoid simple carbohydrates from their diet including Cakes, Sweet Desserts / Pastries, Ice Cream, Soda (diet and regular), Sweet Tea, Candies, Chips, Cookies, Sweet Pastries, Store Bought Juices, Alcohol in Excess of 1-2 drinks a day, Artificial Sweeteners, Coffee Creamer, and "Sugar-free" Products. This will help patient to have stable blood glucose profile and potentially avoid unintended weight gain.   - I encouraged the patient to switch to unprocessed or minimally processed complex starch and increased protein intake (animal or plant source), fruits, and vegetables.   - Patient is advised to stick to a routine mealtimes to eat 3 meals a day and avoid unnecessary snacks (to snack only to correct hypoglycemia).  - I have approached her with the following individualized plan to manage her diabetes and patient agrees:   Given her type 1 diagnosis, insulin is the only effective evidence-based treatment option.  She is  advised to increase her dose of Semglee to 34 units SQ nightly, and adjust her Novolog to 4-10 units TID with meals if glucose is above 90 and she is eating (Specific instructions on how to titrate insulin dosage based on glucose readings given to patient in writing).  I did prescribe Omnipod 5 today through [place].  -she is encouraged to start monitoring glucose 4 times daily (using her CGM), before meals and before bed and to call the clinic if she has readings less than 70 or above 300 for 3 tests in a row.  - she is warned not to take insulin without  proper monitoring per orders. - Adjustment parameters are given to her for hypo and hyperglycemia in writing.  - Specific targets for  A1c; LDL, HDL, and Triglycerides were discussed with the patient.  2) Blood Pressure /Hypertension:  her blood pressure is controlled to target.   she is advised to continue her current medications including Lasix 40 mg po daily, Hydralazine 37.5 mg po  mg p.o TID, and Metoprolol 25 mg po daily.  3) Lipids/Hyperlipidemia:    Review of her recent lipid panel from 06/13/20 showed controlled LDL at 55 .  she is advised to continue Lipitor 40 mg daily at bedtime and Fenofibrate 160 mg po daily.  Side effects and precautions discussed with her.  Will recheck lipid panel prior to next visit.  4)  Weight/Diet:  her Body mass index is 33.28 kg/m.  -  clearly complicating her diabetes care.   she is a candidate for weight loss. I discussed with her the fact that loss of 5 - 10% of her  current body weight will have the most impact on her diabetes management.  Exercise, and detailed carbohydrates information provided  -  detailed on discharge instructions.  5) Hypothyroidism-unspecified The details surrounding her diagnosis are unavailable.  She is currently on Levothyroxine 200 mcg po daily.  Her most recent thyroid function tests are consistent with under-replacement.  Will repeat thyroid function tests prior to next visit and adjust dose if necessary.   - The correct intake of thyroid hormone (Levothyroxine, Synthroid), is on empty stomach first thing in the morning, with water, separated by at least 30 minutes from breakfast and other medications,  and separated by more than 4 hours from calcium, iron, multivitamins, acid reflux medications (PPIs).  - This medication is a life-long medication and will be needed to correct thyroid hormone imbalances for the rest of your life.  The dose may change from time to time, based on thyroid blood work.  - It is extremely  important to be consistent taking this medication, near the same time each morning.  -AVOID TAKING PRODUCTS CONTAINING BIOTIN (commonly found in Hair, Skin, Nails vitamins) AS IT INTERFERES WITH THE VALIDITY OF THYROID FUNCTION BLOOD TESTS.  6) Chronic Care/Health Maintenance: -she is not on ACEI/ARB and is on Statin medications and is encouraged to initiate and continue to follow up with Ophthalmology, Dentist, Podiatrist at least yearly or according to recommendations, and advised to stay away from smoking. I have recommended yearly flu vaccine and pneumonia vaccine at least every 5 years; moderate intensity exercise for up to 150 minutes weekly; and sleep for at least 7 hours a day.  - she is advised to maintain close follow up with Lemmie Evens, MD for primary care needs, as well as her other providers for optimal and coordinated care.     I spent 40 minutes in the care  of the patient today including review of labs from [place], Lipids, Thyroid Function, Hematology (current and previous including abstractions from other facilities); face-to-face time discussing  her blood glucose readings/logs, discussing hypoglycemia and hyperglycemia episodes and symptoms, medications doses, her options of short and long term treatment based on the latest standards of care / guidelines;  discussion about incorporating lifestyle medicine;  and documenting the encounter.    Please refer to Patient Instructions for Blood Glucose Monitoring and Insulin/Medications Dosing Guide"  in media tab for additional information. Please  also refer to " Patient Self Inventory" in the Media  tab for reviewed elements of pertinent patient history.  [name] participated in the discussions, expressed understanding, and voiced agreement with the above plans.  All questions were answered to her satisfaction. she is encouraged to contact clinic should she have any questions or concerns prior to her return  visit.     Follow up plan: - Return in about 3 months (around 01/19/2022) for Diabetes F/U with A1c in office, Thyroid follow up, Previsit labs, Bring meter and logs.    Rayetta Pigg, [facility] [address] [place], [place] [address] Phone: [phone] Fax: [phone]  10/19/2021, 9:40 AM

## 2021-10-19 NOTE — Patient Instructions (Signed)
Diabetes Mellitus and Standards of Medical Care Living with and managing diabetes (diabetes mellitus) can be complicated. Your diabetes treatment may be managed by a team of health care providers, including: A physician who specializes in diabetes (endocrinologist). You might also have visits with a nurse practitioner or physician assistant. Nurses. A registered dietitian. A certified diabetes care and education specialist. An exercise specialist. A pharmacist. An eye doctor. A foot specialist (podiatrist). A dental care provider. A primary care provider. A mental health care provider. How to manage your diabetes You can do many things to successfully manage your diabetes. Your health care providers will follow guidelines to help you get the best quality of care. Here are general guidelines for your diabetes management plan. Your health care providers may give you more specific instructions. Physical exams When you are diagnosed with diabetes, and each year after that, your health care provider will ask about your medical and family history. You will have a physical exam, which may include: Measuring your height, weight, and body mass index (BMI). Checking your blood pressure. This will be done at every routine medical visit. Your target blood pressure may vary depending on your medical conditions, your age, and other factors. A thyroid exam. A skin exam. Screening for nerve damage (peripheral neuropathy). This may include checking the pulse in your legs and feet and the level of sensation in your hands and feet. A foot exam to inspect the structure and skin of your feet, including checking for cuts, bruises, redness, blisters, sores, or other problems. Screening for blood vessel (vascular) problems. This may include checking the pulse in your legs and feet and checking your temperature. Blood tests Depending on your treatment plan and your personal needs, you may have the following  tests: Hemoglobin A1C (HbA1C). This test provides information about blood sugar (glucose) control over the previous 2-3 months. It is used to adjust your treatment plan, if needed. This test will be done: At least 2 times a year, if you are meeting your treatment goals. 4 times a year, if you are not meeting your treatment goals or if your goals have changed. Lipid testing, including total cholesterol, LDL and HDL cholesterol, and triglyceride levels. The goal for LDL is less than 100 mg/dL (5.5 mmol/L). If you are at high risk for complications, the goal is less than 70 mg/dL (3.9 mmol/L). The goal for HDL is 40 mg/dL (2.2 mmol/L) or higher for men, and 50 mg/dL (2.8 mmol/L) or higher for women. An HDL cholesterol of 60 mg/dL (3.3 mmol/L) or higher gives some protection against heart disease. The goal for triglycerides is less than 150 mg/dL (8.3 mmol/L). Liver function tests. Kidney function tests. Thyroid function tests.  Dental and eye exams  Visit your dentist two times a year. If you have type 1 diabetes, your health care provider may recommend an eye exam within 5 years after you are diagnosed, and then once a year after your first exam. For children with type 1 diabetes, the health care provider may recommend an eye exam when your child is age 11 or older and has had diabetes for 3-5 years. After the first exam, your child should get an eye exam once a year. If you have type 2 diabetes, your health care provider may recommend an eye exam as soon as you are diagnosed, and then every 1-2 years after your first exam. Immunizations A yearly flu (influenza) vaccine is recommended annually for everyone 6 months or older. This is especially   important if you have diabetes. The pneumonia (pneumococcal) vaccine is recommended for everyone 2 years or older who has diabetes. If you are age 65 or older, you may get the pneumonia vaccine as a series of two separate shots. The hepatitis B vaccine is  recommended for adults shortly after being diagnosed with diabetes. Adults and children with diabetes should receive all other vaccines according to age-specific recommendations from the Centers for Disease Control and Prevention (CDC). Mental and emotional health Screening for symptoms of eating disorders, anxiety, and depression is recommended at the time of diagnosis and after as needed. If your screening shows that you have symptoms, you may need more evaluation. You may work with a mental health care provider. Follow these instructions at home: Treatment plan You will monitor your blood glucose levels and may give yourself insulin. Your treatment plan will be reviewed at every medical visit. You and your health care provider will discuss: How you are taking your medicines, including insulin. Any side effects you have. Your blood glucose level target goals. How often you monitor your blood glucose level. Lifestyle habits, such as activity level and tobacco, alcohol, and substance use. Education Your health care provider will assess how well you are monitoring your blood glucose levels and whether you are taking your insulin and medicines correctly. He or she may refer you to: A certified diabetes care and education specialist to manage your diabetes throughout your life, starting at diagnosis. A registered dietitian who can create and review your personal nutrition plan. An exercise specialist who can discuss your activity level and exercise plan. General instructions Take over-the-counter and prescription medicines only as told by your health care provider. Keep all follow-up visits. This is important. Where to find support There are many diabetes support networks, including: American Diabetes Association (ADA): diabetes.org Defeat Diabetes Foundation: defeatdiabetes.org Where to find more information American Diabetes Association (ADA): www.diabetes.org Association of Diabetes Care &  Education Specialists (ADCES): diabeteseducator.org International Diabetes Federation (IDF): idf.org Summary Managing diabetes (diabetes mellitus) can be complicated. Your diabetes treatment may be managed by a team of health care providers. Your health care providers follow guidelines to help you get the best quality care. You should have physical exams, blood tests, blood pressure monitoring, immunizations, and screening tests regularly. Stay updated on how to manage your diabetes. Your health care providers may also give you more specific instructions based on your individual health. This information is not intended to replace advice given to you by your health care provider. Make sure you discuss any questions you have with your health care provider. Document Revised: 10/25/2019 Document Reviewed: 10/25/2019 Elsevier Patient Education  2023 Elsevier Inc.  

## 2021-10-20 ENCOUNTER — Ambulatory Visit (INDEPENDENT_AMBULATORY_CARE_PROVIDER_SITE_OTHER): Payer: No Typology Code available for payment source | Admitting: Podiatry

## 2021-10-20 DIAGNOSIS — T8189XA Other complications of procedures, not elsewhere classified, initial encounter: Secondary | ICD-10-CM

## 2021-10-20 DIAGNOSIS — E10621 Type 1 diabetes mellitus with foot ulcer: Secondary | ICD-10-CM

## 2021-10-20 DIAGNOSIS — L97422 Non-pressure chronic ulcer of left heel and midfoot with fat layer exposed: Secondary | ICD-10-CM | POA: Diagnosis not present

## 2021-10-20 NOTE — Progress Notes (Signed)
  Subjective:  Patient ID: Lauren [name], female    DOB: June 15, 1980,  MRN: [id number]    DOS: 09/04/2021 Procedure: Partial fifth ray amputation and closure  41 y.o. female returns for post-op check.  Has still been changing the bandage at home with Prisma  Review of Systems: Negative except as noted in the HPI. Denies N/V/F/Ch.   Objective:  There were no vitals filed for this visit. There is no height or weight on file to calculate BMI. Constitutional Well developed. Well nourished.  Vascular Foot warm and well perfused. Capillary refill normal to all digits.  Calf is soft and supple, no posterior calf or knee pain, negative Homans' sign  Neurologic Normal speech. Oriented to person, place, and time. Epicritic sensation to light touch grossly present bilaterally.  Dermatologic Has continued delayed healing with residual ulceration and surgical dehiscence 3.0 x 1.0 x 1.0 cm.  Improved serous drainage no signs of infection.  Fibrogranular wound base  Orthopedic: There is little to no tenderness to palpation noted about the surgical site.       Assessment:   1. Diabetic ulcer of left midfoot associated with type 1 diabetes mellitus, with fat layer exposed ([place])   2. Delayed surgical wound healing, initial encounter     Plan:  Patient was evaluated and treated and all questions answered.  Ulcer left foot -We discussed the etiology and factors that are a part of the wound healing process.  We also discussed the risk of infection both soft tissue and osteomyelitis from open ulceration.  Discussed the risk of limb loss if this happens or worsens. -Debridement as below. -Dressed with Prisma, DSD. -Continue home dressing changes  3 times weekly with 4 x 4 gauze and Prisma -Continue off-loading with surgical shoe. -Encourage strict glycemic control to continue -Last antibiotics: I do not think she needs continued antibiotics currently   -Continues to have slow improvement but  her wound is fairly deep and she has good tissue mobility.  We discussed the options of continued local wound care versus returning to the operating room for a tissue rearrangement and reclosure of the wound.  We discussed the risks and benefit of this including further infection and further dehiscence and failure of the procedure.  She understands and wishes to proceed.  Informed consent was signed and reviewed and surgery will be scheduled for next week.  Procedure: Excisional Debridement of Wound Rationale: Removal of non-viable soft tissue from the wound to promote healing.  Anesthesia: none Post-Debridement Wound Measurements: 3.0 x 1.0 x 1.0 cm d Type of Debridement: Sharp Excisional Tissue Removed: Non-viable soft tissue Depth of Debridement: subcutaneous tissue. Technique: Sharp excisional debridement to bleeding, viable wound base.  Dressing: Dry, sterile, compression dressing. Disposition: Patient tolerated procedure well.       No follow-ups on file.

## 2021-10-22 ENCOUNTER — Telehealth: Payer: Self-pay | Admitting: Urology

## 2021-10-26 ENCOUNTER — Other Ambulatory Visit: Payer: Self-pay | Admitting: *Deleted

## 2021-10-29 ENCOUNTER — Ambulatory Visit: Payer: No Typology Code available for payment source | Admitting: Podiatry

## 2021-10-29 DIAGNOSIS — E10621 Type 1 diabetes mellitus with foot ulcer: Secondary | ICD-10-CM

## 2021-10-29 DIAGNOSIS — T8189XA Other complications of procedures, not elsewhere classified, initial encounter: Secondary | ICD-10-CM | POA: Diagnosis not present

## 2021-10-29 DIAGNOSIS — L97422 Non-pressure chronic ulcer of left heel and midfoot with fat layer exposed: Secondary | ICD-10-CM

## 2021-10-29 MED ORDER — AMOXICILLIN-POT CLAVULANATE 875-125 MG PO TABS: 1.0000 | ORAL_TABLET | Freq: Two times a day (BID) | ORAL | 0 refills | Status: AC

## 2021-10-29 MED ORDER — TRAMADOL HCL 50 MG PO TABS: 50.0000 mg | ORAL_TABLET | Freq: Four times a day (QID) | ORAL | 0 refills | Status: AC | PRN

## 2021-10-29 NOTE — Progress Notes (Signed)
Patient Name: Lauren [name] DOB: 17-Feb-1981  MRN: [id number]   Date of Service: 10/29/21  Surgeon: Dr. Lanae Crumbly, DPM Assistants: None Pre-operative Diagnosis:  Delayed surgical wound healing' Diabetic ulcer of left foot Post-operative Diagnosis:  Delayed surgical wound healing' Diabetic ulcer of left foot Procedures:  1) Adjacent tissue transfer for wound defect measuring 3.0 sq cm Pathology/Specimens: None Anesthesia: local only Hemostasis: Ankle tourniquet at 255mmHg for 28 minutes Estimated Blood Loss: <10cc Materials: No implants Medications: 10cc of a 1:1 mixture of 2% lidocaine with epinephrine and 0.5% marcaine plain Complications: no complications noted  Indications for Procedure:  This is a 41 y.o. female with a history of uncontrolled type 2 diabetes and polyneuropathy with previous osteomyelitis necessitating partial fifth ray resection.  She had delayed surgical wound healing in the distal portions of the wound did not heal after several weeks of local wound care.  We discussed tissue transfer and rearrangement to close the remaining portions of the wound.  Informed consent was signed and reviewed prior to surgery.  All risk benefits and potential complications were discussed   Procedure in Detail: Patient was identified by name and date of birth. Formal consent was signed and the left lower extremity was marked. Patient was brought back to the procedure room.  Local field block anesthesia was induced. The extremity was prepped and draped in the usual sterile fashion. Timeout was taken to confirm patient name, laterality, and procedure prior to incision.   Attention was then directed to the left foot which exhibited the open ulceration and delayed surgical wound healing measuring approximate 1.0 x 3.0 cm.  This extended down to the level of the capsule of the fourth MPJ and the muscle layer.  I began by resecting a wedge of the rolled skin edges on each side in a  semiellipse to reapproximate the wound.  The lateral flap was mobilized and advanced medially.  The deeper soft tissues were thoroughly debrided with scalpel in an excisional manner to remove any nonviable areas of tissue until healthy bleeding granular tissue was noted.  Hemostasis was achieved with Surgicel and manual compression.  The Surgicel was not left intact and was removed once hemostasis was achieved.  The wound was then thoroughly irrigated with normal sterile saline.  Layered closure with 3-0 Monocryl and 3-0 nylon was then completed.  The foot was then dressed with Adaptic Betadine soaked gauze dry sterile gauze and an Ace wrap. Patient tolerated the procedure well.   Disposition: Patient tolerated the procedure well and was transferred to home.  She was prescribed Augmentin and tramadol for pain control.  She will be WBAT in a surgical shoe.  She will change the dressing once early next week and follow-up with Dr. Posey Pronto at her scheduled follow-up appointment next week and see me then 2 weeks later.

## 2021-10-29 NOTE — Addendum Note (Signed)
Addended by: Wardell Heath on: 10/29/2021 08:16 AM   Modules accepted: Orders

## 2021-10-30 ENCOUNTER — Other Ambulatory Visit (HOSPITAL_COMMUNITY): Payer: Self-pay

## 2021-10-30 ENCOUNTER — Other Ambulatory Visit (HOSPITAL_BASED_OUTPATIENT_CLINIC_OR_DEPARTMENT_OTHER): Payer: Self-pay

## 2021-11-01 LAB — WOUND CULTURE
MICRO NUMBER:: 13589118
SPECIMEN QUALITY:: ADEQUATE

## 2021-11-06 ENCOUNTER — Other Ambulatory Visit (HOSPITAL_COMMUNITY): Payer: Self-pay

## 2021-11-06 ENCOUNTER — Ambulatory Visit (INDEPENDENT_AMBULATORY_CARE_PROVIDER_SITE_OTHER): Payer: No Typology Code available for payment source | Admitting: Podiatry

## 2021-11-06 DIAGNOSIS — Z9889 Other specified postprocedural states: Secondary | ICD-10-CM

## 2021-11-06 NOTE — Progress Notes (Signed)
Subjective:  Patient ID: Lauren [name], female    DOB: [birth year],  MRN: [id number]  Chief Complaint  Patient presents with   Routine Post Op      POV #1 DOS 10/29/2021 WOUND EXCISION & CLOSURE LT FOOT/DR MCDONALD PT    DOS: 10/29/2021 Procedure: Left wound excision with primary closure  41 y.o. female returns for post-op check.  Patient states she is doing okay.  She had the procedure done by [name].  She has been walking a lot on her feet with surgical shoe 1.  I discussed with her to not walk as much as there is a risk of dehiscence with it.  She denies any other acute complaints.  No nausea fever chills vomiting  Review of Systems: Negative except as noted in the HPI. Denies N/V/F/Ch.  Past Medical History:  Diagnosis Date   Anemia    CAD (coronary artery disease)    a. s/p cath in 03/2014 showing 30% mid-LAD, moderate to severe disease along small D1, patent LCx, moderate to severe distal OM2 stenosis and moderate diffuse diease along RCA not amenable to PCI   CHF (congestive heart failure) ([place])    a. EF 55-60% in 12/2019 b. EF at 35-40% by echo in 05/2020   Diabetes mellitus without complication (HCC)    Myocardial infarction ([place])    Neuropathy    Renal disorder    stage 3 kidney disease   Stroke [facility])     Current Outpatient Medications:    acetaminophen (TYLENOL) 500 MG tablet, Take 1,500 mg by mouth every 6 (six) hours as needed for moderate pain or headache., Disp: , Rfl:    atorvastatin (LIPITOR) 40 MG tablet, TAKE 1 TABLET BY MOUTH ONCE DAILY., Disp: 90 tablet, Rfl: 1   clopidogrel (PLAVIX) 75 MG tablet, Take 1 tablet (75 mg total) by mouth daily., Disp: 90 tablet, Rfl: 3   Continuous Blood Gluc Sensor (DEXCOM G6 SENSOR) MISC, Change sensor every 10 days as directed, Disp: 9 each, Rfl: 3   Continuous Blood Gluc Transmit (DEXCOM G6 TRANSMITTER) MISC, Change transmitter every 90 days as directed., Disp: 1 each, Rfl: 3   fenofibrate 160 MG tablet, TAKE 1 TABLET  BY MOUTH ONCE DAILY. (Patient taking differently: Take 160 mg by mouth daily.), Disp: 90 tablet, Rfl: 3   furosemide (LASIX) 40 MG tablet, Take 1 tablet (40 mg total) by mouth daily. Take daily or as directed, Disp: 90 tablet, Rfl: 3   hydrALAZINE (APRESOLINE) 25 MG tablet, Take 1 & 1/2 tablets (37.5 mg total) by mouth 3 (three) times daily., Disp: 135 tablet, Rfl: 5   Insulin Disposable Pump (OMNIPOD 5 G6 INTRO, GEN 5,) KIT, Change pod every 48-72 hrs, Disp: 1 kit, Rfl: 0   Insulin Disposable Pump (OMNIPOD 5 G6 POD, GEN 5,) MISC, Change pod every 48-72 hours, Disp: 5 each, Rfl: 6   insulin glargine-yfgn (SEMGLEE) 100 UNIT/ML Pen, Inject 34 Units into the skin at bedtime., Disp: 15 mL, Rfl: 0   Insulin Pen Needle 31G X 5 MM MISC, Use to inject novolog and semglee as directed, Disp: 100 each, Rfl: 10   isosorbide mononitrate (IMDUR) 30 MG 24 hr tablet, Take 1 tablet (30 mg total) by mouth daily., Disp: 90 tablet, Rfl: 3   levothyroxine (SYNTHROID) 200 MCG tablet, Take 1 tablet (200 mcg total) by mouth daily at 6 (six) AM., Disp: 30 tablet, Rfl: 0   metoprolol succinate (TOPROL-XL) 25 MG 24 hr tablet, Take 1 tablet (25 mg total)  by mouth daily., Disp: 90 tablet, Rfl: 3   nitroGLYCERIN (NITROSTAT) 0.4 MG SL tablet, Place 1 tablet (0.4 mg total) under the tongue every 5 (five) minutes x 3 doses as needed for chest pain., Disp: 25 tablet, Rfl: 3   NOVOLOG FLEXPEN 100 UNIT/ML FlexPen, Inject 4-10 Units into the skin 3 (three) times daily with meals., Disp: , Rfl:    pantoprazole (PROTONIX) 40 MG tablet, Take 1 tablet (40 mg total) by mouth daily as needed for acid reflux, Disp: 90 tablet, Rfl: 2   potassium chloride SA (KLOR-CON M) 20 MEQ tablet, Take 1 tablet (20 mEq total) by mouth daily, Disp: 90 tablet, Rfl: 3  Social History   Tobacco Use  Smoking Status Former   Packs/day: 0.25   Types: Cigarettes   Quit date: 10/27/2020   Years since quitting: 1.0  Smokeless Tobacco Never    Allergies   Allergen Reactions   Wellbutrin [Bupropion] Hives   Cefepime Rash    Tolerates penicilllin   Ciprofloxacin Hcl Hives and Rash    Hives/rash at injection site    Tape Rash   Objective:  There were no vitals filed for this visit. There is no height or weight on file to calculate BMI. Constitutional Well developed. Well nourished.  Vascular Foot warm and well perfused. Capillary refill normal to all digits.   Neurologic Normal speech. Oriented to person, place, and time. Epicritic sensation to light touch grossly present bilaterally.  Dermatologic Skin healing well without signs of infection. Skin edges well coapted without signs of infection.  Orthopedic: Tenderness to palpation noted about the surgical site.   Radiographs: None Assessment:   1. Status post foot surgery    Plan:  Patient was evaluated and treated and all questions answered.  S/p foot surgery left -Progressing as expected post-operatively. -XR: See above -WB Status: Weightbearing as tolerated in surgical shoe -Sutures: Intact.  No clinical signs of Deis is noted no complication noted. -Medications: None -I encouraged her to do daily Betadine wet-to-dry dressing change.  She states understanding will do so  No follow-ups on file.

## 2021-11-09 ENCOUNTER — Other Ambulatory Visit (HOSPITAL_COMMUNITY): Payer: Self-pay

## 2021-11-13 ENCOUNTER — Other Ambulatory Visit (HOSPITAL_COMMUNITY): Payer: Self-pay

## 2021-11-16 ENCOUNTER — Telehealth: Payer: Self-pay | Admitting: Nurse Practitioner

## 2021-11-16 ENCOUNTER — Other Ambulatory Visit (HOSPITAL_COMMUNITY): Payer: Self-pay

## 2021-11-16 ENCOUNTER — Other Ambulatory Visit: Payer: Self-pay | Admitting: Nurse Practitioner

## 2021-11-16 MED ORDER — INSULIN LISPRO 100 UNIT/ML IJ SOLN: INTRAMUSCULAR | 3 refills | Status: DC

## 2021-11-16 NOTE — Progress Notes (Signed)
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

## 2021-11-16 NOTE — Telephone Encounter (Signed)
Lauren [name], Pt left a VM requesting a call back in regards to her pump.

## 2021-11-16 NOTE — Telephone Encounter (Signed)
I sent in order form to [facility], the trainer, and sent in a vial of Humalog to her pharmacy for her to pick up.  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

## 2021-11-17 ENCOUNTER — Other Ambulatory Visit: Payer: Self-pay | Admitting: *Deleted

## 2021-11-17 ENCOUNTER — Other Ambulatory Visit (HOSPITAL_COMMUNITY): Payer: Self-pay

## 2021-11-17 NOTE — Patient Outreach (Signed)
  Care Coordination   Follow Up Visit Note   11/24/2021 Name: Lauren [name] MRN: [id number] DOB: April 22, 1981  Lauren [name] is a 41 y.o. year old female who sees Lemmie Evens, MD for primary care. I spoke with  Lauren [name] by phone today  What matters to the patients health and wellness today?   She was able to express her concerns about her decrease healing, being out of work.   Goals Addressed   None     SDOH assessments and interventions completed:   Yes SDOH Interventions Today    Flowsheet Row Most Recent Value  SDOH Interventions   Food Insecurity Interventions Intervention Not Indicated  Social Connections Interventions Intervention Not Indicated  Transportation Interventions Intervention Not Indicated       Care Coordination Interventions Activated:  No Care Coordination Interventions:   No, not indicated  Follow up plan: Follow up call scheduled for within the next 7-10 business days  Encounter Outcome:  Pt. Visit Completed  Lauren Hiers L. Lavina Hamman, RN, BSN, Deal Coordinator Office number [phone] Main [facility] number [phone] Fax number [phone]

## 2021-11-18 ENCOUNTER — Other Ambulatory Visit: Payer: Self-pay

## 2021-11-18 ENCOUNTER — Inpatient Hospital Stay (HOSPITAL_COMMUNITY): Payer: No Typology Code available for payment source | Admitting: Certified Registered Nurse Anesthetist

## 2021-11-18 ENCOUNTER — Emergency Department (HOSPITAL_COMMUNITY): Payer: No Typology Code available for payment source

## 2021-11-18 ENCOUNTER — Encounter (HOSPITAL_COMMUNITY)
Admission: EM | Disposition: A | Discharge status: Home / Self Care | Payer: Self-pay | Source: Home / Self Care | Attending: Family Medicine

## 2021-11-18 ENCOUNTER — Inpatient Hospital Stay (HOSPITAL_COMMUNITY)
Admission: EM | Admit: 2021-11-18 | Discharge: 2021-11-21 | DRG: 253 | Disposition: A | Discharge status: Home / Self Care | Payer: No Typology Code available for payment source | Attending: Family Medicine | Admitting: Family Medicine

## 2021-11-18 ENCOUNTER — Encounter (HOSPITAL_COMMUNITY): Payer: Self-pay | Admitting: Emergency Medicine

## 2021-11-18 DIAGNOSIS — I742 Embolism and thrombosis of arteries of the upper extremities: Secondary | ICD-10-CM | POA: Diagnosis present

## 2021-11-18 DIAGNOSIS — I5042 Chronic combined systolic (congestive) and diastolic (congestive) heart failure: Secondary | ICD-10-CM | POA: Diagnosis present

## 2021-11-18 DIAGNOSIS — E782 Mixed hyperlipidemia: Secondary | ICD-10-CM | POA: Diagnosis not present

## 2021-11-18 DIAGNOSIS — I70228 Atherosclerosis of native arteries of extremities with rest pain, other extremity: Secondary | ICD-10-CM | POA: Diagnosis present

## 2021-11-18 DIAGNOSIS — E1059 Type 1 diabetes mellitus with other circulatory complications: Secondary | ICD-10-CM | POA: Diagnosis present

## 2021-11-18 DIAGNOSIS — I998 Other disorder of circulatory system: Secondary | ICD-10-CM | POA: Diagnosis not present

## 2021-11-18 DIAGNOSIS — I2510 Atherosclerotic heart disease of native coronary artery without angina pectoris: Secondary | ICD-10-CM | POA: Diagnosis present

## 2021-11-18 DIAGNOSIS — Z87891 Personal history of nicotine dependence: Secondary | ICD-10-CM

## 2021-11-18 DIAGNOSIS — E119 Type 2 diabetes mellitus without complications: Secondary | ICD-10-CM | POA: Diagnosis not present

## 2021-11-18 DIAGNOSIS — I651 Occlusion and stenosis of basilar artery: Secondary | ICD-10-CM | POA: Diagnosis present

## 2021-11-18 DIAGNOSIS — R008 Other abnormalities of heart beat: Secondary | ICD-10-CM | POA: Diagnosis not present

## 2021-11-18 DIAGNOSIS — E039 Hypothyroidism, unspecified: Secondary | ICD-10-CM

## 2021-11-18 DIAGNOSIS — Z7902 Long term (current) use of antithrombotics/antiplatelets: Secondary | ICD-10-CM | POA: Diagnosis not present

## 2021-11-18 DIAGNOSIS — Z9109 Other allergy status, other than to drugs and biological substances: Secondary | ICD-10-CM

## 2021-11-18 DIAGNOSIS — S98132A Complete traumatic amputation of one left lesser toe, initial encounter: Secondary | ICD-10-CM | POA: Diagnosis present

## 2021-11-18 DIAGNOSIS — I509 Heart failure, unspecified: Secondary | ICD-10-CM | POA: Diagnosis not present

## 2021-11-18 DIAGNOSIS — E1022 Type 1 diabetes mellitus with diabetic chronic kidney disease: Secondary | ICD-10-CM | POA: Diagnosis present

## 2021-11-18 DIAGNOSIS — Z79890 Hormone replacement therapy (postmenopausal): Secondary | ICD-10-CM

## 2021-11-18 DIAGNOSIS — Z8673 Personal history of transient ischemic attack (TIA), and cerebral infarction without residual deficits: Secondary | ICD-10-CM | POA: Diagnosis not present

## 2021-11-18 DIAGNOSIS — Z794 Long term (current) use of insulin: Secondary | ICD-10-CM

## 2021-11-18 DIAGNOSIS — Z720 Tobacco use: Secondary | ICD-10-CM | POA: Diagnosis present

## 2021-11-18 DIAGNOSIS — Z8249 Family history of ischemic heart disease and other diseases of the circulatory system: Secondary | ICD-10-CM | POA: Diagnosis not present

## 2021-11-18 DIAGNOSIS — Z89422 Acquired absence of other left toe(s): Secondary | ICD-10-CM | POA: Diagnosis not present

## 2021-11-18 DIAGNOSIS — Z79899 Other long term (current) drug therapy: Secondary | ICD-10-CM

## 2021-11-18 DIAGNOSIS — Z9049 Acquired absence of other specified parts of digestive tract: Secondary | ICD-10-CM | POA: Diagnosis not present

## 2021-11-18 DIAGNOSIS — Z803 Family history of malignant neoplasm of breast: Secondary | ICD-10-CM | POA: Diagnosis not present

## 2021-11-18 DIAGNOSIS — I252 Old myocardial infarction: Secondary | ICD-10-CM

## 2021-11-18 DIAGNOSIS — E1051 Type 1 diabetes mellitus with diabetic peripheral angiopathy without gangrene: Principal | ICD-10-CM | POA: Diagnosis present

## 2021-11-18 DIAGNOSIS — I10 Essential (primary) hypertension: Secondary | ICD-10-CM | POA: Diagnosis not present

## 2021-11-18 DIAGNOSIS — Z83438 Family history of other disorder of lipoprotein metabolism and other lipidemia: Secondary | ICD-10-CM

## 2021-11-18 DIAGNOSIS — Z8349 Family history of other endocrine, nutritional and metabolic diseases: Secondary | ICD-10-CM | POA: Diagnosis not present

## 2021-11-18 DIAGNOSIS — E1040 Type 1 diabetes mellitus with diabetic neuropathy, unspecified: Secondary | ICD-10-CM | POA: Diagnosis present

## 2021-11-18 DIAGNOSIS — N1832 Chronic kidney disease, stage 3b: Secondary | ICD-10-CM | POA: Diagnosis present

## 2021-11-18 DIAGNOSIS — I130 Hypertensive heart and chronic kidney disease with heart failure and stage 1 through stage 4 chronic kidney disease, or unspecified chronic kidney disease: Secondary | ICD-10-CM | POA: Diagnosis present

## 2021-11-18 DIAGNOSIS — D638 Anemia in other chronic diseases classified elsewhere: Secondary | ICD-10-CM

## 2021-11-18 DIAGNOSIS — R0989 Other specified symptoms and signs involving the circulatory and respiratory systems: Secondary | ICD-10-CM | POA: Diagnosis present

## 2021-11-18 DIAGNOSIS — Z823 Family history of stroke: Secondary | ICD-10-CM | POA: Diagnosis not present

## 2021-11-18 DIAGNOSIS — N189 Chronic kidney disease, unspecified: Secondary | ICD-10-CM | POA: Diagnosis not present

## 2021-11-18 HISTORY — PX: THROMBECTOMY BRACHIAL ARTERY: SHX6649

## 2021-11-18 LAB — CBC WITH DIFFERENTIAL/PLATELET
Abs Immature Granulocytes: 0.1 10*3/uL — ABNORMAL HIGH (ref 0.00–0.07)
Basophils Absolute: 0.1 10*3/uL (ref 0.0–0.1)
Basophils Relative: 1 %
Eosinophils Absolute: 0.2 10*3/uL (ref 0.0–0.5)
Eosinophils Relative: 1 %
HCT: 45.4 % (ref 36.0–46.0)
Hemoglobin: 15.7 g/dL — ABNORMAL HIGH (ref 12.0–15.0)
Immature Granulocytes: 1 %
Lymphocytes Relative: 15 %
Lymphs Abs: 2.5 10*3/uL (ref 0.7–4.0)
MCH: 30.2 pg (ref 26.0–34.0)
MCHC: 34.6 g/dL (ref 30.0–36.0)
MCV: 87.3 fL (ref 80.0–100.0)
Monocytes Absolute: 0.7 10*3/uL (ref 0.1–1.0)
Monocytes Relative: 4 %
Neutro Abs: 12.7 10*3/uL — ABNORMAL HIGH (ref 1.7–7.7)
Neutrophils Relative %: 78 %
Platelets: 380 10*3/uL (ref 150–400)
RBC: 5.2 MIL/uL — ABNORMAL HIGH (ref 3.87–5.11)
RDW: 12.8 % (ref 11.5–15.5)
WBC: 16.2 10*3/uL — ABNORMAL HIGH (ref 4.0–10.5)
nRBC: 0 % (ref 0.0–0.2)

## 2021-11-18 LAB — GLUCOSE, CAPILLARY
Glucose-Capillary: 467 mg/dL — ABNORMAL HIGH (ref 70–99)
Glucose-Capillary: 497 mg/dL — ABNORMAL HIGH (ref 70–99)
Glucose-Capillary: 370 mg/dL — ABNORMAL HIGH (ref 70–99)
Glucose-Capillary: 358 mg/dL — ABNORMAL HIGH (ref 70–99)
Glucose-Capillary: 265 mg/dL — ABNORMAL HIGH (ref 70–99)
Glucose-Capillary: 201 mg/dL — ABNORMAL HIGH (ref 70–99)
Glucose-Capillary: 419 mg/dL — ABNORMAL HIGH (ref 70–99)

## 2021-11-18 LAB — I-STAT CHEM 8, ED
BUN: 33 mg/dL — ABNORMAL HIGH (ref 6–20)
Calcium, Ion: 1.11 mmol/L — ABNORMAL LOW (ref 1.15–1.40)
Chloride: 105 mmol/L (ref 98–111)
Creatinine, Ser: 2.2 mg/dL — ABNORMAL HIGH (ref 0.44–1.00)
Glucose, Bld: 490 mg/dL — ABNORMAL HIGH (ref 70–99)
HCT: 47 % — ABNORMAL HIGH (ref 36.0–46.0)
Hemoglobin: 16 g/dL — ABNORMAL HIGH (ref 12.0–15.0)
Potassium: 4.4 mmol/L (ref 3.5–5.1)
Sodium: 134 mmol/L — ABNORMAL LOW (ref 135–145)
TCO2: 19 mmol/L — ABNORMAL LOW (ref 22–32)

## 2021-11-18 LAB — COMPREHENSIVE METABOLIC PANEL
ALT: 15 U/L (ref 0–44)
AST: 14 U/L — ABNORMAL LOW (ref 15–41)
Albumin: 3.1 g/dL — ABNORMAL LOW (ref 3.5–5.0)
Alkaline Phosphatase: 105 U/L (ref 38–126)
Anion gap: 11 (ref 5–15)
BUN: 31 mg/dL — ABNORMAL HIGH (ref 6–20)
CO2: 18 mmol/L — ABNORMAL LOW (ref 22–32)
Calcium: 9 mg/dL (ref 8.9–10.3)
Chloride: 103 mmol/L (ref 98–111)
Creatinine, Ser: 2.16 mg/dL — ABNORMAL HIGH (ref 0.44–1.00)
GFR, Estimated: 29 mL/min — ABNORMAL LOW (ref >60)
Glucose, Bld: 493 mg/dL — ABNORMAL HIGH (ref 70–99)
Potassium: 4.2 mmol/L (ref 3.5–5.1)
Sodium: 132 mmol/L — ABNORMAL LOW (ref 135–145)
Total Bilirubin: 1 mg/dL (ref 0.3–1.2)
Total Protein: 7.3 g/dL (ref 6.5–8.1)

## 2021-11-18 LAB — ANTITHROMBIN III: AntiThromb III Func: 95 % (ref 75–120)

## 2021-11-18 LAB — HEPARIN LEVEL (UNFRACTIONATED)
Heparin Unfractionated: 0.98 IU/mL — ABNORMAL HIGH (ref 0.30–0.70)
Heparin Unfractionated: 0.5 IU/mL (ref 0.30–0.70)

## 2021-11-18 SURGERY — THROMBECTOMY, ARTERY, BRACHIAL
Anesthesia: General | Site: Arm Upper | Laterality: Right

## 2021-11-18 MED ORDER — INSULIN ASPART 100 UNIT/ML IJ SOLN: 0.0000 [IU] | INTRAMUSCULAR | Status: DC | PRN

## 2021-11-18 MED ORDER — SUGAMMADEX SODIUM 200 MG/2ML IV SOLN
INTRAVENOUS | Status: DC | PRN
  Administered 2021-11-18: 200 mg via INTRAVENOUS

## 2021-11-18 MED ORDER — MORPHINE SULFATE (PF) 2 MG/ML IV SOLN
2.0000 mg | INTRAVENOUS | Status: DC | PRN
  Administered 2021-11-20 (×4): 2 mg via INTRAVENOUS
  Filled 2021-11-18 (×4): qty 1

## 2021-11-18 MED ORDER — SODIUM CHLORIDE 0.9 % IV SOLN: INTRAVENOUS | Status: DC

## 2021-11-18 MED ORDER — BISACODYL 5 MG PO TBEC: 5.0000 mg | DELAYED_RELEASE_TABLET | Freq: Every day | ORAL | Status: DC | PRN

## 2021-11-18 MED ORDER — PROPOFOL 10 MG/ML IV BOLUS
INTRAVENOUS | Status: DC | PRN
  Administered 2021-11-18: 20 mg via INTRAVENOUS
  Administered 2021-11-18: 100 mg via INTRAVENOUS

## 2021-11-18 MED ORDER — OXYCODONE-ACETAMINOPHEN 5-325 MG PO TABS: 2.0000 | ORAL_TABLET | Freq: Once | ORAL | Status: DC

## 2021-11-18 MED ORDER — INSULIN ASPART 100 UNIT/ML IJ SOLN
INTRAMUSCULAR | Status: AC
  Administered 2021-11-18: 7 [IU] via SUBCUTANEOUS
  Filled 2021-11-18: qty 1

## 2021-11-18 MED ORDER — ACETAMINOPHEN 650 MG RE SUPP: 650.0000 mg | Freq: Four times a day (QID) | RECTAL | Status: DC | PRN

## 2021-11-18 MED ORDER — INSULIN REGULAR(HUMAN) IN NACL 100-0.9 UT/100ML-% IV SOLN
INTRAVENOUS | Status: DC
  Administered 2021-11-18: 8 [IU]/h via INTRAVENOUS

## 2021-11-18 MED ORDER — HEPARIN SODIUM (PORCINE) 1000 UNIT/ML IJ SOLN
INTRAMUSCULAR | Status: DC | PRN
  Administered 2021-11-18: 4000 [IU] via INTRAVENOUS

## 2021-11-18 MED ORDER — METOPROLOL SUCCINATE ER 25 MG PO TB24
25.0000 mg | ORAL_TABLET | Freq: Once | ORAL | Status: AC
Start: 2021-11-18 — End: 2021-11-18

## 2021-11-18 MED ORDER — DEXAMETHASONE SODIUM PHOSPHATE 10 MG/ML IJ SOLN
INTRAMUSCULAR | Status: DC | PRN
  Administered 2021-11-18: 5 mg via INTRAVENOUS

## 2021-11-18 MED ORDER — HEPARIN 6000 UNIT IRRIGATION SOLUTION
Status: DC | PRN
  Administered 2021-11-18: 1

## 2021-11-18 MED ORDER — INSULIN ASPART 100 UNIT/ML IJ SOLN
7.0000 [IU] | Freq: Once | INTRAMUSCULAR | Status: AC
Start: 2021-11-18 — End: 2021-11-18

## 2021-11-18 MED ORDER — PHENYLEPHRINE HCL-NACL 20-0.9 MG/250ML-% IV SOLN
INTRAVENOUS | Status: DC | PRN
  Administered 2021-11-18: 25 ug/min via INTRAVENOUS

## 2021-11-18 MED ORDER — LEVOTHYROXINE SODIUM 75 MCG PO TABS
175.0000 ug | ORAL_TABLET | Freq: Every day | ORAL | Status: DC
Start: 2021-11-19 — End: 2021-11-21
  Administered 2021-11-19 – 2021-11-21 (×3): 175 ug via ORAL
  Filled 2021-11-18 (×3): qty 1

## 2021-11-18 MED ORDER — FENTANYL CITRATE PF 50 MCG/ML IJ SOSY
50.0000 ug | PREFILLED_SYRINGE | Freq: Once | INTRAMUSCULAR | Status: AC
  Administered 2021-11-18: 50 ug via INTRAVENOUS
  Filled 2021-11-18: qty 1

## 2021-11-18 MED ORDER — INSULIN ASPART 100 UNIT/ML IJ SOLN
0.0000 [IU] | Freq: Three times a day (TID) | INTRAMUSCULAR | Status: DC
  Administered 2021-11-18: 5 [IU] via SUBCUTANEOUS
  Administered 2021-11-19 (×2): 8 [IU] via SUBCUTANEOUS
  Administered 2021-11-19: 5 [IU] via SUBCUTANEOUS
  Administered 2021-11-20: 8 [IU] via SUBCUTANEOUS
  Administered 2021-11-20: 3 [IU] via SUBCUTANEOUS
  Administered 2021-11-20 – 2021-11-21 (×2): 5 [IU] via SUBCUTANEOUS

## 2021-11-18 MED ORDER — ONDANSETRON HCL 4 MG/2ML IJ SOLN
INTRAMUSCULAR | Status: AC
  Administered 2021-11-18: 4 mg via INTRAVENOUS
  Filled 2021-11-18: qty 2

## 2021-11-18 MED ORDER — PHENYLEPHRINE 80 MCG/ML (10ML) SYRINGE FOR IV PUSH (FOR BLOOD PRESSURE SUPPORT)
PREFILLED_SYRINGE | INTRAVENOUS | Status: DC | PRN
  Administered 2021-11-18 (×2): 80 ug via INTRAVENOUS
  Administered 2021-11-18: 240 ug via INTRAVENOUS
  Administered 2021-11-18: 160 ug via INTRAVENOUS
  Administered 2021-11-18: 240 ug via INTRAVENOUS

## 2021-11-18 MED ORDER — FENOFIBRATE 160 MG PO TABS
160.0000 mg | ORAL_TABLET | Freq: Every day | ORAL | Status: DC
  Administered 2021-11-19 – 2021-11-21 (×3): 160 mg via ORAL
  Filled 2021-11-18 (×3): qty 1

## 2021-11-18 MED ORDER — OXYCODONE HCL 5 MG PO TABS
5.0000 mg | ORAL_TABLET | ORAL | Status: DC | PRN
  Administered 2021-11-19 – 2021-11-20 (×3): 5 mg via ORAL
  Filled 2021-11-18 (×3): qty 1

## 2021-11-18 MED ORDER — CHLORHEXIDINE GLUCONATE 0.12 % MT SOLN: 15.0000 mL | Freq: Once | OROMUCOSAL | Status: AC

## 2021-11-18 MED ORDER — CEFAZOLIN SODIUM-DEXTROSE 2-4 GM/100ML-% IV SOLN
INTRAVENOUS | Status: AC
  Filled 2021-11-18: qty 100

## 2021-11-18 MED ORDER — HYDRALAZINE HCL 20 MG/ML IJ SOLN: 5.0000 mg | INTRAMUSCULAR | Status: DC | PRN

## 2021-11-18 MED ORDER — HEPARIN 6000 UNIT IRRIGATION SOLUTION
Status: AC
  Filled 2021-11-18: qty 500

## 2021-11-18 MED ORDER — HYDROMORPHONE HCL 1 MG/ML IJ SOLN
1.0000 mg | Freq: Once | INTRAMUSCULAR | Status: AC
  Administered 2021-11-18: 1 mg via INTRAVENOUS
  Filled 2021-11-18: qty 1

## 2021-11-18 MED ORDER — ACETAMINOPHEN 325 MG PO TABS
650.0000 mg | ORAL_TABLET | Freq: Four times a day (QID) | ORAL | Status: DC | PRN
  Administered 2021-11-19 – 2021-11-21 (×3): 650 mg via ORAL
  Filled 2021-11-18 (×3): qty 2

## 2021-11-18 MED ORDER — FENTANYL CITRATE (PF) 250 MCG/5ML IJ SOLN
INTRAMUSCULAR | Status: DC | PRN
Start: 2021-11-18 — End: 2021-11-18
  Administered 2021-11-18: 100 ug via INTRAVENOUS

## 2021-11-18 MED ORDER — SODIUM CHLORIDE 0.9 % IV SOLN
8.0000 mg | Freq: Once | INTRAVENOUS | Status: DC
  Filled 2021-11-18: qty 4

## 2021-11-18 MED ORDER — IOHEXOL 350 MG/ML SOLN
100.0000 mL | Freq: Once | INTRAVENOUS | Status: AC | PRN
  Administered 2021-11-18: 100 mL via INTRAVENOUS

## 2021-11-18 MED ORDER — DOCUSATE SODIUM 100 MG PO CAPS
100.0000 mg | ORAL_CAPSULE | Freq: Two times a day (BID) | ORAL | Status: DC
  Administered 2021-11-19: 100 mg via ORAL
  Filled 2021-11-18 (×6): qty 1

## 2021-11-18 MED ORDER — ORAL CARE MOUTH RINSE: 15.0000 mL | Freq: Once | OROMUCOSAL | Status: AC

## 2021-11-18 MED ORDER — METOPROLOL SUCCINATE ER 25 MG PO TB24
ORAL_TABLET | ORAL | Status: AC
  Administered 2021-11-18: 25 mg via ORAL
  Filled 2021-11-18: qty 1

## 2021-11-18 MED ORDER — POLYETHYLENE GLYCOL 3350 17 G PO PACK: 17.0000 g | PACK | Freq: Every day | ORAL | Status: DC | PRN

## 2021-11-18 MED ORDER — ONDANSETRON HCL 4 MG PO TABS: 4.0000 mg | ORAL_TABLET | Freq: Four times a day (QID) | ORAL | Status: DC | PRN

## 2021-11-18 MED ORDER — ONDANSETRON HCL 4 MG/2ML IJ SOLN: 4.0000 mg | Freq: Once | INTRAMUSCULAR | Status: AC

## 2021-11-18 MED ORDER — CEFAZOLIN SODIUM-DEXTROSE 2-3 GM-%(50ML) IV SOLR
INTRAVENOUS | Status: DC | PRN
  Administered 2021-11-18: 2 g via INTRAVENOUS

## 2021-11-18 MED ORDER — AMOXICILLIN-POT CLAVULANATE 875-125 MG PO TABS
1.0000 | ORAL_TABLET | Freq: Two times a day (BID) | ORAL | Status: DC
  Administered 2021-11-18 – 2021-11-21 (×6): 1 via ORAL
  Filled 2021-11-18 (×7): qty 1

## 2021-11-18 MED ORDER — ALBUTEROL SULFATE (2.5 MG/3ML) 0.083% IN NEBU: 2.5000 mg | INHALATION_SOLUTION | RESPIRATORY_TRACT | Status: DC | PRN

## 2021-11-18 MED ORDER — HEMOSTATIC AGENTS (NO CHARGE) OPTIME
TOPICAL | Status: DC | PRN
  Administered 2021-11-18: 1 via TOPICAL

## 2021-11-18 MED ORDER — FENTANYL CITRATE (PF) 250 MCG/5ML IJ SOLN
INTRAMUSCULAR | Status: AC
  Filled 2021-11-18: qty 5

## 2021-11-18 MED ORDER — SODIUM CHLORIDE 0.9% FLUSH
3.0000 mL | Freq: Two times a day (BID) | INTRAVENOUS | Status: DC
  Administered 2021-11-18 – 2021-11-21 (×6): 3 mL via INTRAVENOUS

## 2021-11-18 MED ORDER — INSULIN ASPART 100 UNIT/ML IJ SOLN
0.0000 [IU] | Freq: Every day | INTRAMUSCULAR | Status: DC
  Administered 2021-11-19: 4 [IU] via SUBCUTANEOUS

## 2021-11-18 MED ORDER — LIDOCAINE 2% (20 MG/ML) 5 ML SYRINGE
INTRAMUSCULAR | Status: DC | PRN
  Administered 2021-11-18: 60 mg via INTRAVENOUS

## 2021-11-18 MED ORDER — METOPROLOL SUCCINATE ER 25 MG PO TB24
25.0000 mg | ORAL_TABLET | Freq: Every day | ORAL | Status: DC
  Administered 2021-11-19 – 2021-11-21 (×3): 25 mg via ORAL
  Filled 2021-11-18 (×3): qty 1

## 2021-11-18 MED ORDER — CHLORHEXIDINE GLUCONATE 0.12 % MT SOLN
OROMUCOSAL | Status: AC
  Administered 2021-11-18: 15 mL via OROMUCOSAL
  Filled 2021-11-18: qty 15

## 2021-11-18 MED ORDER — ISOSORBIDE MONONITRATE ER 30 MG PO TB24
30.0000 mg | ORAL_TABLET | Freq: Every day | ORAL | Status: DC
  Administered 2021-11-18 – 2021-11-21 (×4): 30 mg via ORAL
  Filled 2021-11-18 (×4): qty 1

## 2021-11-18 MED ORDER — INSULIN GLARGINE-YFGN 100 UNIT/ML ~~LOC~~ SOLN
34.0000 [IU] | Freq: Every day | SUBCUTANEOUS | Status: DC
Start: 2021-11-18 — End: 2021-11-20
  Administered 2021-11-18 – 2021-11-20 (×3): 34 [IU] via SUBCUTANEOUS
  Filled 2021-11-18 (×3): qty 0.34

## 2021-11-18 MED ORDER — HEPARIN (PORCINE) 25000 UT/250ML-% IV SOLN
1500.0000 [IU]/h | INTRAVENOUS | Status: DC
  Administered 2021-11-18 – 2021-11-19 (×3): 1300 [IU]/h via INTRAVENOUS
  Administered 2021-11-20: 1500 [IU]/h via INTRAVENOUS
  Filled 2021-11-18 (×4): qty 250

## 2021-11-18 MED ORDER — ATORVASTATIN CALCIUM 40 MG PO TABS
40.0000 mg | ORAL_TABLET | Freq: Every day | ORAL | Status: DC
Start: 2021-11-18 — End: 2021-11-21
  Administered 2021-11-18 – 2021-11-20 (×3): 40 mg via ORAL
  Filled 2021-11-18 (×3): qty 1

## 2021-11-18 MED ORDER — INSULIN ASPART 100 UNIT/ML IJ SOLN
6.0000 [IU] | Freq: Once | INTRAMUSCULAR | Status: AC
Start: 2021-11-18 — End: 2021-11-18
  Administered 2021-11-18: 6 [IU] via SUBCUTANEOUS

## 2021-11-18 MED ORDER — MIDAZOLAM HCL 2 MG/2ML IJ SOLN
INTRAMUSCULAR | Status: AC
  Filled 2021-11-18: qty 2

## 2021-11-18 MED ORDER — HEPARIN BOLUS VIA INFUSION
4000.0000 [IU] | Freq: Once | INTRAVENOUS | Status: AC
Start: 2021-11-18 — End: 2021-11-18
  Administered 2021-11-18: 4000 [IU] via INTRAVENOUS

## 2021-11-18 MED ORDER — ONDANSETRON HCL 4 MG/2ML IJ SOLN
4.0000 mg | Freq: Four times a day (QID) | INTRAMUSCULAR | Status: DC | PRN
  Administered 2021-11-20: 4 mg via INTRAVENOUS
  Filled 2021-11-18 (×2): qty 2

## 2021-11-18 MED ORDER — ONDANSETRON HCL 4 MG/2ML IJ SOLN
INTRAMUSCULAR | Status: DC | PRN
  Administered 2021-11-18: 4 mg via INTRAVENOUS

## 2021-11-18 MED ORDER — ROCURONIUM BROMIDE 10 MG/ML (PF) SYRINGE
PREFILLED_SYRINGE | INTRAVENOUS | Status: DC | PRN
  Administered 2021-11-18: 100 mg via INTRAVENOUS

## 2021-11-18 MED ORDER — PROPOFOL 10 MG/ML IV BOLUS
INTRAVENOUS | Status: AC
  Filled 2021-11-18: qty 20

## 2021-11-18 SURGICAL SUPPLY — 40 items
ADH SKN CLS APL DERMABOND .7 (GAUZE/BANDAGES/DRESSINGS) ×1
ARMBAND PINK RESTRICT EXTREMIT (MISCELLANEOUS) ×3 IMPLANT
CANISTER SUCT 1200ML W/VALVE (MISCELLANEOUS) ×3 IMPLANT
CATH EMB 2FR 60CM (CATHETERS) ×1 IMPLANT
CATH EMB 3FR 40CM (CATHETERS) ×2 IMPLANT
CATH EMB 3FR 80CM (CATHETERS) IMPLANT
CATH EMB 4FR 80CM (CATHETERS) IMPLANT
CATH EMB 5FR 80CM (CATHETERS) IMPLANT
CLIP TI MEDIUM 6 (CLIP) ×3 IMPLANT
CLIP TI WIDE RED SMALL 6 (CLIP) ×3 IMPLANT
COVER PROBE W GEL 5X96 (DRAPES) ×3 IMPLANT
DERMABOND ADVANCED (GAUZE/BANDAGES/DRESSINGS) ×1
DERMABOND ADVANCED .7 DNX12 (GAUZE/BANDAGES/DRESSINGS) ×2 IMPLANT
ELECT REM PT RETURN 9FT ADLT (ELECTROSURGICAL) ×2 IMPLANT
ELECTRODE REM PT RTRN 9FT ADLT (ELECTROSURGICAL) ×2 IMPLANT
GLOVE BIO SURGEON STRL SZ 6 (GLOVE) ×1 IMPLANT
GLOVE BIO SURGEON STRL SZ7.5 (GLOVE) ×4 IMPLANT
GLOVE BIOGEL PI IND STRL 8 (GLOVE) IMPLANT
GLOVE BIOGEL PI INDICATOR 8 (GLOVE) ×1
GOWN STRL REUS W/ TWL LRG LVL3 (GOWN DISPOSABLE) ×4 IMPLANT
GOWN STRL REUS W/ TWL XL LVL3 (GOWN DISPOSABLE) ×2 IMPLANT
GOWN STRL REUS W/TWL LRG LVL3 (GOWN DISPOSABLE) ×4
GOWN STRL REUS W/TWL XL LVL3 (GOWN DISPOSABLE) ×2
KIT BASIN OR (CUSTOM PROCEDURE TRAY) ×3 IMPLANT
KIT TURNOVER KIT B (KITS) ×3 IMPLANT
LOOP VESSEL MINI RED (MISCELLANEOUS) ×1 IMPLANT
MARKER SKIN DUAL TIP RULER LAB (MISCELLANEOUS) ×1 IMPLANT
NS IRRIG 1000ML POUR BTL (IV SOLUTION) ×3 IMPLANT
PACK CV ACCESS (CUSTOM PROCEDURE TRAY) ×3 IMPLANT
PAD ARMBOARD 7.5X6 YLW CONV (MISCELLANEOUS) ×6 IMPLANT
PENCIL BUTTON HOLSTER BLD 10FT (ELECTRODE) ×1 IMPLANT
POWDER SURGICEL 3.0 GRAM (HEMOSTASIS) ×1 IMPLANT
SUT MNCRL AB 4-0 PS2 18 (SUTURE) ×3 IMPLANT
SUT PROLENE 6 0 BV (SUTURE) ×7 IMPLANT
SUT VIC AB 3-0 SH 27 (SUTURE) ×2
SUT VIC AB 3-0 SH 27X BRD (SUTURE) ×2 IMPLANT
SYR 3ML LL SCALE MARK (SYRINGE) ×2 IMPLANT
TOWEL GREEN STERILE FF (TOWEL DISPOSABLE) ×3 IMPLANT
UNDERPAD 30X36 HEAVY ABSORB (UNDERPADS AND DIAPERS) ×3 IMPLANT
WATER STERILE IRR 1000ML POUR (IV SOLUTION) ×3 IMPLANT

## 2021-11-18 NOTE — Progress Notes (Signed)
[place] for Hepairin Indication: DVT  Allergies  Allergen Reactions   Wellbutrin [Bupropion] Hives   Cefepime Rash    Tolerates penicilllin   Ciprofloxacin Hcl Hives and Rash    Hives/rash at injection site    Tape Rash    Patient Measurements: Height: 5\' 5"  (165.1 cm) Weight: 90.4 kg (199 lb 4.7 oz) IBW/kg (Calculated) : 57  Heparin Dosing Weight: 77 kg  Vital Signs: Temp: 98 F (36.7 C) (07/19 2000) Temp Source: Oral (07/19 2000) BP: 136/73 (07/19 2000) Pulse Rate: 83 (07/19 2000)  Labs: Recent Labs    11/18/21 0555 11/18/21 0601 11/18/21 1523 11/18/21 1942  HGB 15.7* 16.0*  --   --   HCT 45.4 47.0*  --   --   PLT 380  --   --   --   HEPARINUNFRC  --   --  0.98* 0.50  CREATININE 2.16* 2.20*  --   --     Estimated Creatinine Clearance: 38.2 mL/min (A) (by C-G formula based on SCr of 2.2 mg/dL (H)).   Assessment: 41 year old female who presents to the ER with acute left-sided arm pain. Not on anticoagulation prior to admission. 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 Pharmacy consulted to manage heparin.   Initial heparin level above goal 0.98 most likely still effected by recent heparin bolus. Subsequent heparin level within goal of 0.50 on heparin 1300 units/hr. No issues with heparin infusion or signs of bleeding per RN.   Goal of Therapy:  Heparin level 0.3-0.7 units/ml Monitor platelets by anticoagulation protocol: Yes   Plan:  Continue heparin infusion at 1300 units/hr Check heparin level in 6 hours and daily while on heparin Continue to monitor H&H and platelets    Thank you for allowing pharmacy to be a part of this patient's care.  Ardyth Harps, PharmD Clinical Pharmacist

## 2021-11-18 NOTE — Transfer of Care (Signed)
Immediate Anesthesia Transfer of Care Note  Patient: Lauren [name]  Procedure(s) Performed: RIGHT BRACHIAL, RADIAL, & ULNAR ARTERY THROMBECTOMY. (Right: Arm Upper)  Patient Location: PACU  Anesthesia Type:General  Level of Consciousness: drowsy and patient cooperative  Airway & Oxygen Therapy: Patient Spontanous Breathing and Patient connected to face mask oxygen  Post-op Assessment: Report given to RN and Post -op Vital signs reviewed and stable  Post vital signs: Reviewed and stable  Last Vitals:  Vitals Value Taken Time  BP 143/64 11/18/21 1202  Temp    Pulse 96 11/18/21 1206  Resp 24 11/18/21 1206  SpO2 89 % 11/18/21 1206  Vitals shown include unvalidated device data.  Last Pain:  Vitals:   11/18/21 0730  PainSc: 6          Complications: No notable events documented.

## 2021-11-18 NOTE — Op Note (Signed)
Date: November 18, 2021  Preoperative diagnosis: Acutely ischemic right upper extremity  Postoperative diagnosis: Same  Procedure: Right brachial, radial, and ulnar artery thrombectomy  Surgeon: Dr. Marty Heck, MD  Assistant: Arlee Muslim, PA  Indications: 41 year old female that had acute onset pain and numbness in the right upper extremity around 2 AM.  She was transferred here from [facility] with acutely ischemic right upper extremity.  She presents for right upper extremity thrombectomy after risk benefits discussed.  Findings: Cutdown on the right brachial artery at the antecubitum where the radial and ulnar bifurcated.  When the arteriotomy was made there was acute thrombus in the distal brachial artery as well as significant amounts of acute thrombus retrieved from both radial and ulnar artery.  We had excellent backbleeding at completion with good pulsatile inflow.  Once the arteriotomy was repaired there was a palpable pulse at the wrist in the radial artery.  An assistant was needed for exposure and to assist with the thrombectomy.  Anesthesia: General  Details: Patient was taken to the operating room after informed consent was obtained.  Placed on the operative table supine position.  General endotracheal anesthesia was induced.  The right arm was then prepped and draped in standard sterile fashion.  Antibiotics were given.  Timeout performed.  I had already marked the bifurcation of the brachial artery at the antecubitum and ultimately a longitudinal incision was made here.  Dissected down with Bovie cautery and ultimately the brachial artery was dissected away from the nerve and controlled with a vessel loop.  There was not a good pulse in the distal brachial artery.  I then dissected out the radial and ulnar artery at the bifurcation and these were controlled with Vesseloops.  Patient was given another 5000 units of IV heparin.  Arteriotomy was made in the distal brachial  artery with 11 blade scalpel extended with Potts scissors in transverse fashion.  There was acute thrombus visualized in the arteriotomy that was retrieved manually.  I then passed a #3 Fogarty proximally in the brachial artery and got an additional plug of thrombus with excellent pulsatile inflow.  I then passed a #3 Fogarty initially down the radial artery and got multiple plugs of acute appearing thrombus.  I then passed a #3 Fogarty down the ulnar artery and got additional plugs of thrombus.  I elected to pass a #2 Fogarty down both the radial and ulnar into the hand and did not retrieve any additional thrombus.  There was excellent backbleeding from both vessels.  The arteriotomy was closed with interrupted 6-0 Prolene's and we de-aired the artery prior to completion.  We came off clamps there was a palpable radial pulse at the wrist.  We had excellent Doppler signals to correlate.  The wound was then irrigated out with saline and hemostasis was achieved.  Surgicel powder was placed in the wound and then we closed it loosely with 3-0 Vicryl 4-0 Monocryl and Dermabond.  Complication: None  Condition: Stable  Marty Heck, MD [facility] Office: [place]

## 2021-11-18 NOTE — Anesthesia Procedure Notes (Signed)
Procedure Name: Intubation Date/Time: 11/18/2021 10:47 AM  Performed by: Colin Benton, CRNAPre-anesthesia Checklist: Patient identified, Emergency Drugs available, Suction available and Patient being monitored Patient Re-evaluated:Patient Re-evaluated prior to induction Oxygen Delivery Method: Circle system utilized Preoxygenation: Pre-oxygenation with 100% oxygen Induction Type: IV induction Ventilation: Mask ventilation without difficulty Laryngoscope Size: Miller and 2 Grade View: Grade I Tube type: Oral Tube size: 7.0 mm Number of attempts: 1 Airway Equipment and Method: Stylet Placement Confirmation: ETT inserted through vocal cords under direct vision, positive ETCO2 and breath sounds checked- equal and bilateral Secured at: 22 cm Tube secured with: Tape Dental Injury: Teeth and Oropharynx as per pre-operative assessment

## 2021-11-18 NOTE — Anesthesia Postprocedure Evaluation (Signed)
Anesthesia Post Note  Patient: Lauren [name]  Procedure(s) Performed: RIGHT BRACHIAL, RADIAL, & ULNAR ARTERY THROMBECTOMY. (Right: Arm Upper)     Patient location during evaluation: PACU Anesthesia Type: General Level of consciousness: awake and alert Pain management: pain level controlled Vital Signs Assessment: post-procedure vital signs reviewed and stable Respiratory status: spontaneous breathing, nonlabored ventilation, respiratory function stable and patient connected to nasal cannula oxygen Cardiovascular status: blood pressure returned to baseline and stable Postop Assessment: no apparent nausea or vomiting Anesthetic complications: no   No notable events documented.  Last Vitals:  Vitals:   11/18/21 1400 11/18/21 1500  BP: (!) 111/56   Pulse: 82 83  Resp: 15 14  Temp:    SpO2: 96% 97%    Last Pain:  Vitals:   11/18/21 1500  TempSrc:   PainSc: Asleep                 Deloria Brassfield L Casey Maxfield

## 2021-11-18 NOTE — H&P (Signed)
History and Physical    Patient: Lauren [name] MRN:[id number] DOB: 06/12/1980 DOA: 11/18/2021 DOS: the patient was seen and examined on 11/18/2021 PCP: Knowlton, Steve, MD  Patient coming from: Home - lives with husband, son; NOK: Husband, James Merrell, [phone]   Chief Complaint: L arm pain  HPI: Lauren [name] is a 41 y.o. female with medical history significant of CAD; chronic systolic CHF; DM; CVA; and stage 3 CKD presenting with L arm pain.  She reports T1DM since age 11, currently scheduled for a zoom meeting today at 4pm to get approved for insulin pump, last A1c was 9.6.  She also quit smoking 2 weeks ago.  She has a chronic L foot wound with poor healing post-amputation and is followed by podiatry; she was due to have sutures removed tomorrow.  She awoke at 0200 with severe acute R forearm and hand pain.  It was cool to touch and her husband called 911.  Vascular surgery has seen her and is planning to take her to the OR.    ER Course:  Acute onset L lower arm pain, hand numbness.  Cold, pulseless UE at [facility].  Seen by vascular, will need to go to OR today.     Review of Systems: As mentioned in the history of present illness. All other systems reviewed and are negative. Past Medical History:  Diagnosis Date   Anemia    CAD (coronary artery disease)    a. s/p cath in 03/2014 showing 30% mid-LAD, moderate to severe disease along small D1, patent LCx, moderate to severe distal OM2 stenosis and moderate diffuse diease along RCA not amenable to PCI   CHF (congestive heart failure) (HCC)    a. EF 55-60% in 12/2019 b. EF at 35-40% by echo in 05/2020   Diabetes mellitus without complication (HCC)    Myocardial infarction (HCC)    Neuropathy    Renal disorder    stage 3 kidney disease   Stroke (HCC)    Past Surgical History:  Procedure Laterality Date   AMPUTATION Left 09/02/2021   Procedure: AMPUTATION RAY;  Surgeon: McDonald, Adam R, DPM;  Location: [facility] OR;  Service:  Podiatry;  Laterality: Left;  sagittal saw, 3L bag saline & Pulse   Cardiac catherization     CHOLECYSTECTOMY     I & D EXTREMITY Left 08/30/2021   Procedure: IRRIGATION AND DEBRIDEMENT WITH BONE BIOPSY;  Surgeon: Patel, Kevin P, DPM;  Location: [facility] OR;  Service: Podiatry;  Laterality: Left;   IRRIGATION AND DEBRIDEMENT FOOT Left 09/04/2021   Procedure: IRRIGATION AND DEBRIDEMENT FOOT AND CLOSURE;  Surgeon: McDonald, Adam R, DPM;  Location: [facility] OR;  Service: Podiatry;  Laterality: Left;   TUBAL LIGATION     Social History:  reports that she quit smoking about 12 months ago. Her smoking use included cigarettes. She smoked an average of .25 packs per day. She has never used smokeless tobacco. She reports that she does not drink alcohol and does not use drugs.  Allergies  Allergen Reactions   Wellbutrin [Bupropion] Hives   Cefepime Rash    Tolerates penicilllin   Ciprofloxacin Hcl Hives and Rash    Hives/rash at injection site    Tape Rash    Family History  Problem Relation Age of Onset   Thyroid disease Mother    Hypertension Mother    Hyperlipidemia Mother    CAD Mother    CVA Mother    Hyperlipidemia Father    Hypertension Father      CAD Father    Breast cancer Paternal Grandmother 45   Cancer Paternal Grandmother        lung    Prior to Admission medications   Medication Sig Start Date End Date Taking? Authorizing Provider  acetaminophen (TYLENOL) 500 MG tablet Take 1,500 mg by mouth every 6 (six) hours as needed for moderate pain or headache.    [provider]  atorvastatin (LIPITOR) 40 MG tablet TAKE 1 TABLET BY MOUTH ONCE DAILY. 09/24/21   Barrett, Rhonda G, PA-C  clopidogrel (PLAVIX) 75 MG tablet Take 1 tablet (75 mg total) by mouth daily. 09/24/21   Barrett, Rhonda G, PA-C  Continuous Blood Gluc Sensor (DEXCOM G6 SENSOR) MISC Change sensor every 10 days as directed 09/29/21   Reardon, Whitney J, NP  Continuous Blood Gluc Transmit (DEXCOM G6 TRANSMITTER) MISC Change  transmitter every 90 days as directed. 09/29/21   Reardon, Whitney J, NP  fenofibrate 160 MG tablet TAKE 1 TABLET BY MOUTH ONCE DAILY. Patient taking differently: Take 160 mg by mouth daily. 10/01/20   Branch, Jonathan F, MD  furosemide (LASIX) 40 MG tablet Take 1 tablet (40 mg total) by mouth daily. Take daily or as directed 09/24/21   Barrett, Rhonda G, PA-C  hydrALAZINE (APRESOLINE) 25 MG tablet Take 1 & 1/2 tablets (37.5 mg total) by mouth 3 (three) times daily. 02/19/21   Branch, Jonathan F, MD  Insulin Disposable Pump (OMNIPOD 5 G6 INTRO, GEN 5,) KIT Change pod every 48-72 hrs 10/19/21   Reardon, Whitney J, NP  Insulin Disposable Pump (OMNIPOD 5 G6 POD, GEN 5,) MISC Change pod every 48-72 hours 10/19/21   Reardon, Whitney J, NP  insulin lispro (HUMALOG) 100 UNIT/ML injection Use with Omnipod for TDD around 60 units daily 11/16/21   Reardon, Whitney J, NP  Insulin Pen Needle 31G X 5 MM MISC Use to inject novolog and semglee as directed 08/25/20     isosorbide mononitrate (IMDUR) 30 MG 24 hr tablet Take 1 tablet (30 mg total) by mouth daily. 09/24/21 09/19/22  Barrett, Rhonda G, PA-C  levothyroxine (SYNTHROID) 200 MCG tablet Take 1 tablet (200 mcg total) by mouth daily at 6 (six) AM. 09/06/21 10/06/21  Pahwani, Ravi, MD  metoprolol succinate (TOPROL-XL) 25 MG 24 hr tablet Take 1 tablet (25 mg total) by mouth daily. 09/24/21   Barrett, Rhonda G, PA-C  nitroGLYCERIN (NITROSTAT) 0.4 MG SL tablet Place 1 tablet (0.4 mg total) under the tongue every 5 (five) minutes x 3 doses as needed for chest pain. 09/24/21   Barrett, Rhonda G, PA-C  pantoprazole (PROTONIX) 40 MG tablet Take 1 tablet (40 mg total) by mouth daily as needed for acid reflux 11/26/20     potassium chloride SA (KLOR-CON M) 20 MEQ tablet Take 1 tablet (20 mEq total) by mouth daily 09/24/21   Barrett, Rhonda G, PA-C    Physical Exam: Vitals:   11/18/21 1315 11/18/21 1342 11/18/21 1400 11/18/21 1500  BP: 112/69 117/72 (!) 111/56   Pulse: 86 84 82 83   Resp: 19 17 15 14  Temp: (!) 97.3 F (36.3 C) 98.1 F (36.7 C)    TempSrc:  Oral    SpO2: 95% 95% 96% 97%  Weight:  90.4 kg    Height:  5' 5" (1.651 m)     General:  Appears calm and comfortable and is in NAD Eyes:  EOMI, normal lids, iris ENT:  grossly normal hearing, lips & tongue, mmm Neck:  no LAD, masses or thyromegaly   Cardiovascular:  RRR, no m/r/g. No LE edema.  Respiratory:   CTA bilaterally with no wheezes/rales/rhonchi.  Normal respiratory effort. Abdomen:  soft, NT, ND Skin:  mottling of R hand  Musculoskeletal:  grossly normal tone BUE/BLE,  no bony abnormality, R forearm with mottling and cool fingers, concerning for ischemia Lower extremity:  Left foot s/p 5th toe amputation, sutures in place, no obvious infection    Psychiatric:  blunted mood and affect, speech fluent and appropriate, AOx3 Neurologic:  CN 2-12 grossly intact, moves all extremities in coordinated fashion   Radiological Exams on Admission: Independently reviewed - see discussion in A/P where applicable  CT ANGIO UP EXTREM RIGHT W &/OR WO CONTRAST  Result Date: 11/18/2021 CLINICAL DATA:  Acute ischemia right upper extremity EXAM: CT ANGIOGRAPHY OF THE RIGHT UPPEREXTREMITY TECHNIQUE: Multidetector CT imaging of the RIGHT UPPER EXTREMITYwas performed using the standard protocol during bolus administration of intravenous contrast. Multiplanar CT image reconstructions and MIPs were obtained to evaluate the vascular anatomy. RADIATION DOSE REDUCTION: This exam was performed according to the departmental dose-optimization program which includes automated exposure control, adjustment of the mA and/or kV according to patient size and/or use of iterative reconstruction technique. CONTRAST:  181m OMNIPAQUE IOHEXOL 350 MG/ML SOLN COMPARISON:  None Available. FINDINGS: Right upper extremity: Included central chest demonstrates patency of the visualized aortic arch and patent 3 vessel arch anatomy. The  brachiocephalic, visualized right common carotid, right vertebral, right subclavian, and axillary arteries are all patent. Upper arm brachial artery is patent proximally but then has lack of opacification more peripherally in the lower humerus to antecubital region compatible with right brachial thromboembolic occlusion appearing to extend into the radial and ulnar arteries at the elbow. Radial and ulnar arteries are non-opacified into the forearm and wrist to confirm patency. Nonvascular: Included right chest demonstrates no other acute process. No large pericardial effusion or right pleural effusion. No large focal hepatic abnormality. Remote cholecystectomy. Prominent biliary tree without obstruction pattern. Cortical atrophy of the right kidney noted. Aortic atherosclerosis present. Mesenteric and visualized right renal vasculature appear patent. Included right abdomen demonstrates no additional acute process. Normal appendix. IUD noted within the uterine endometrial cavity. Right ovarian 3.3 cm small cyst noted. No pelvic free fluid. No visualized acute osseous finding. Review of the MIP images confirms the above findings. IMPRESSION: Acute arterial occlusion of the peripheral brachial artery just above the elbow which appears to extend into the radius and ulnar arteries compatible with thromboembolic event. (Patient has already had right upper extremity surgical thrombectomy performed) Abdominal aortic atherosclerosis No other acute nonvascular finding as detailed above. 3.3 cm right ovarian cyst No follow-up imaging recommended. Note: This recommendation does not apply to premenarchal patients and to those with increased risk (genetic, family history, elevated tumor markers or other high-risk factors) of ovarian cancer. Reference: JACR 2020 Feb; 17(2):248-254 These results were called by telephone at the time of interpretation on 11/18/2021 at 1:14 pm to provider Dr. JKarmen Bongo who verbally acknowledged  these results. Electronically Signed   By: MJerilynn Mages  Shick M.D.   On: 11/18/2021 13:16    EKG: not done   Labs on Admission: I have personally reviewed the available labs and imaging studies at the time of the admission.  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 contacted via Secure Chat and says sutures can wait until next week -Continue Augmentin, presumably for this issue  DM -Poor glycemic control -She is in the process of trying to get on a pump -Continue glargine and cover with moderate-scale SSI for now -Diabetes coordinator was consulted  Chronic systolic CHF/CAD -Appears compensated - no c/o CP/SOB -Hold Plavix for now while on Heparin -Hold Lasix -Continue Imdur  Stage 3b CKD -Progressing towards stage 4 -She likely needs outpatient nephrology consultation -Will  follow  HLD -Continue Lipitor, fenofibrate  HTN -Continue hydralazine, Toprol XL (some question of compliance based on fill patterns for both)  Hypothyroidism -Continue Synthroid  Tobacco dependence -Not currently smoking, praise provided    Advance Care Planning:   Code Status: Full Code   Consults: Vascular surgery; podiatry by Secure Chat only; diabetes coordinator; TOC team; nutrition  DVT Prophylaxis: Heparin  Family Communication: Husband was present throughout evaluation  Severity of Illness: The appropriate patient status for this patient is INPATIENT. Inpatient status is judged to be reasonable and necessary in order to provide the required intensity of service to ensure the patient's safety. The patient's presenting symptoms, physical exam findings, and initial radiographic and laboratory data in the context of their chronic comorbidities is felt to place them at high risk for further clinical deterioration. Furthermore, it is not anticipated that the patient will be medically stable for discharge from the hospital within 2 midnights of admission.   * I certify that at the point of admission it is my clinical judgment that the patient will require inpatient hospital care spanning beyond 2 midnights from the point of admission due to high intensity of service, high risk for further deterioration and high frequency of surveillance required.*  Author: Karmen Bongo, MD 11/18/2021 6:09 PM  For on call review [email or web address].

## 2021-11-18 NOTE — Progress Notes (Signed)
11/18/2021 1330 Received pt to room 4E-04 from PACU.  Pt is A&O, no C/O voiced.  Tele monitor applied and CCMD notified.  CHG bath given.  Oriented to room, call light and bed.  Call bell in reach. [place]

## 2021-11-18 NOTE — ED Notes (Signed)
Spoke with OR nurse answering the page for Dr. Scot Dock and was informed to call an PA. Called [facility] and requested a page to an available PA to call Dr. Tyrone Nine

## 2021-11-18 NOTE — ED Triage Notes (Signed)
Pt arrived as transfer from [facility] with Right Radial Artery Occulusion. Pt arrived on heparin gtt 1300u/h. Right arm pain started at 0227m, with discolor, cool to touch from elbow to hand.

## 2021-11-18 NOTE — ED Notes (Signed)
Vascular surgeon paged to make aware of patients arrival.

## 2021-11-18 NOTE — ED Provider Notes (Signed)
42 year old female with a chief complaint of sudden onset right arm pain.  Started about 2 AM.  Later on was going to sleep and then he felt like it was very cold and painful.  Patient went to [facility], was found to be pulseless to that extremity.  Was discussed with vascular surgery started on heparin and then he was transferred here to be evaluated.  Patient thinks it is gotten maybe a little bit better.  I do not appreciate any pulses on palpation.  The hand is a bit cooler than the other one with significant decreased cap refill.  Will notify vascular.  Patient's GFR is 29.  This is at her renal function baseline.  I think the benefits outweigh the risk and this could help in obtaining an acute diagnosis of arterial occlusion.  Patient is in agreement.  I discussed case with vascular surgery, Dr. Carlis Abbott.  He evaluated the patient at bedside.  Concerning for acute occlusion.  Will take to the OR.  With patient with multiple comorbidities at 2 will discuss with medicine for admission.  CRITICAL CARE Performed by: Cecilio Asper   Total critical care time: 35 minutes  Critical care time was exclusive of separately billable procedures and treating other patients.  Critical care was necessary to treat or prevent imminent or life-threatening deterioration.  Critical care was time spent personally by me on the following activities: development of treatment plan with patient and/or surrogate as well as nursing, discussions with consultants, evaluation of patient's response to treatment, examination of patient, obtaining history from patient or surrogate, ordering and performing treatments and interventions, ordering and review of laboratory studies, ordering and review of radiographic studies, pulse oximetry and re-evaluation of patient's condition.      Deno Etienne, DO 11/18/21 [phone]

## 2021-11-18 NOTE — Progress Notes (Signed)
ANTICOAGULATION CONSULT NOTE - Initial Consult  Pharmacy Consult for heparin Indication: DVT  Allergies  Allergen Reactions   Wellbutrin [Bupropion] Hives   Cefepime Rash    Tolerates penicilllin   Ciprofloxacin Hcl Hives and Rash    Hives/rash at injection site    Tape Rash    Patient Measurements: Height: 5\' 5"  (165.1 cm) Weight: 90.7 kg (200 lb) IBW/kg (Calculated) : 57 Heparin Dosing Weight: 77.1 kg  Vital Signs: Temp: 97.8 F (36.6 C) (07/19 0453) BP: 177/88 (07/19 0630) Pulse Rate: 98 (07/19 0630)  Labs: Recent Labs    11/18/21 0555 11/18/21 0601  HGB 15.7* 16.0*  HCT 45.4 47.0*  PLT 380  --   CREATININE 2.16* 2.20*    Estimated Creatinine Clearance: 38.2 mL/min (A) (by C-G formula based on SCr of 2.2 mg/dL (H)).   Medical History: Past Medical History:  Diagnosis Date   Anemia    CAD (coronary artery disease)    a. s/p cath in 03/2014 showing 30% mid-LAD, moderate to severe disease along small D1, patent LCx, moderate to severe distal OM2 stenosis and moderate diffuse diease along RCA not amenable to PCI   CHF (congestive heart failure) ([place])    a. EF 55-60% in 12/2019 b. EF at 35-40% by echo in 05/2020   Diabetes mellitus without complication (HCC)    Myocardial infarction ([place])    Neuropathy    Renal disorder    stage 3 kidney disease   Stroke [facility])      Assessment: 41 year old female who presents to the ER with acute left-sided arm pain. Pt has a history of MI, stroke, CKD stage 3. Not on anticoagulation prior to admission. Hgb 16, Plt 380. CT angio upper extremity ordered.  Goal of Therapy:  Heparin level 0.3-0.7 units/ml Monitor platelets by anticoagulation protocol: Yes   Plan:  Give 4000 units bolus x 1 Start heparin infusion at 1300 units/hr Check anti-Xa level in 8 hours and daily while on heparin Continue to monitor H&H and platelets F/u CT   Thank you for allowing [name] to participate in this patients care. Jens Som,  PharmD 11/18/2021 6:37 AM  **Pharmacist phone directory can be found on [email or web address] listed under [place]**

## 2021-11-18 NOTE — ED Notes (Signed)
Report given to Trellis Moment, RN of Short Stay [facility]

## 2021-11-18 NOTE — ED Triage Notes (Signed)
Pt c/o left arm pain from elbow to hand.

## 2021-11-18 NOTE — Consult Note (Signed)
Hospital Consult    Reason for Consult:  Ischemic right arm Referring Physician:  ED MRN #:  [id number]  History of Present Illness: This is a 41 y.o. female with history of diabetes, coronary artery disease status post MI, chronic kidney disease, remote stroke, CHF that vascular surgery has been consulted for an ischemic right arm.  Patient states this morning around 2 AM she developed sudden pain from the right elbow into her hand with numbness.  She was seen at [facility], ED and then transferred here after discussion with Dr. Scot Dock.  She denies any previous atheroembolic events.  She is unclear about the etiology for previous stroke.  She has weakness and numbness in the right hand.  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  She is on Plavix.  Past Medical History:  Diagnosis Date   Anemia    CAD (coronary artery disease)    a. s/p cath in 03/2014 showing 30% mid-LAD, moderate to severe disease along small D1, patent LCx, moderate to severe distal OM2 stenosis and moderate diffuse diease along RCA not amenable to PCI   CHF (congestive heart failure) ([place])    a. EF 55-60% in 12/2019 b. EF at 35-40% by echo in 05/2020   Diabetes mellitus without complication ([place])    Myocardial infarction [facility])    Neuropathy    Renal disorder    stage 3 kidney disease   Stroke [facility])     Past Surgical History:  Procedure Laterality Date   AMPUTATION Left 09/02/2021   Procedure: AMPUTATION RAY;  Surgeon: Criselda Peaches, DPM;  Location: [place];  Service: Podiatry;  Laterality: Left;  sagittal saw, 3L bag saline & Pulse   Cardiac catherization     CHOLECYSTECTOMY     I & D EXTREMITY Left 08/30/2021   Procedure: IRRIGATION AND DEBRIDEMENT WITH BONE BIOPSY;  Surgeon: [name], DPM;  Location: [place];  Service: Podiatry;  Laterality: Left;   IRRIGATION AND DEBRIDEMENT FOOT Left 09/04/2021   Procedure: IRRIGATION AND DEBRIDEMENT FOOT AND CLOSURE;  Surgeon: Criselda Peaches, DPM;   Location: [place];  Service: Podiatry;  Laterality: Left;   TUBAL LIGATION      Allergies  Allergen Reactions   Wellbutrin [Bupropion] Hives   Cefepime Rash    Tolerates penicilllin   Ciprofloxacin Hcl Hives and Rash    Hives/rash at injection site    Tape Rash    Prior to Admission medications   Medication Sig Start Date End Date Taking? Authorizing Provider  acetaminophen (TYLENOL) 500 MG tablet Take 1,500 mg by mouth every 6 (six) hours as needed for moderate pain or headache.    [provider]  atorvastatin (LIPITOR) 40 MG tablet TAKE 1 TABLET BY MOUTH ONCE DAILY. 09/24/21   Barrett, Evelene Croon, PA-C  clopidogrel (PLAVIX) 75 MG tablet Take 1 tablet (75 mg total) by mouth daily. 09/24/21   Barrett, Evelene Croon, PA-C  Continuous Blood Gluc Sensor (DEXCOM G6 SENSOR) MISC Change sensor every 10 days as directed 09/29/21   Brita Romp, NP  Continuous Blood Gluc Transmit (DEXCOM G6 TRANSMITTER) MISC Change transmitter every 90 days as directed. 09/29/21   Brita Romp, NP  fenofibrate 160 MG tablet TAKE 1 TABLET BY MOUTH ONCE DAILY. Patient taking differently: Take 160 mg by mouth daily. 10/01/20   Arnoldo Lenis, MD  furosemide (LASIX) 40 MG tablet Take 1 tablet (40 mg total) by mouth daily. Take daily or as directed 09/24/21  Barrett, Evelene Croon, PA-C  hydrALAZINE (APRESOLINE) 25 MG tablet Take 1 & 1/2 tablets (37.5 mg total) by mouth 3 (three) times daily. 02/19/21   Arnoldo Lenis, MD  Insulin Disposable Pump (OMNIPOD 5 G6 INTRO, GEN 5,) KIT Change pod every 48-72 hrs 10/19/21   Brita Romp, NP  Insulin Disposable Pump (OMNIPOD 5 G6 POD, GEN 5,) MISC Change pod every 48-72 hours 10/19/21   Brita Romp, NP  insulin lispro (HUMALOG) 100 UNIT/ML injection Use with Omnipod for TDD around 60 units daily 11/16/21   Brita Romp, NP  Insulin Pen Needle 31G X 5 MM MISC Use to inject novolog and semglee as directed 08/25/20     isosorbide mononitrate (IMDUR) 30  MG 24 hr tablet Take 1 tablet (30 mg total) by mouth daily. 09/24/21 09/19/22  Barrett, Evelene Croon, PA-C  levothyroxine (SYNTHROID) 200 MCG tablet Take 1 tablet (200 mcg total) by mouth daily at 6 (six) AM. 09/06/21 10/06/21  Darliss Cheney, MD  metoprolol succinate (TOPROL-XL) 25 MG 24 hr tablet Take 1 tablet (25 mg total) by mouth daily. 09/24/21   Barrett, Evelene Croon, PA-C  nitroGLYCERIN (NITROSTAT) 0.4 MG SL tablet Place 1 tablet (0.4 mg total) under the tongue every 5 (five) minutes x 3 doses as needed for chest pain. 09/24/21   Barrett, Evelene Croon, PA-C  pantoprazole (PROTONIX) 40 MG tablet Take 1 tablet (40 mg total) by mouth daily as needed for acid reflux 11/26/20     potassium chloride SA (KLOR-CON M) 20 MEQ tablet Take 1 tablet (20 mEq total) by mouth daily 09/24/21   Barrett, Evelene Croon, PA-C    Social History   Socioeconomic History   Marital status: Married    Spouse name: [name]   Number of children: Not on file   Years of education: Not on file   Highest education level: Not on file  Occupational History    Comment: Full time  Tobacco Use   Smoking status: Former    Packs/day: 0.25    Types: Cigarettes    Quit date: 10/27/2020    Years since quitting: 1.0   Smokeless tobacco: Never  Vaping Use   Vaping Use: Never used  Substance and Sexual Activity   Alcohol use: No    Alcohol/week: 0.0 standard drinks of alcohol   Drug use: No   Sexual activity: Yes    Birth control/protection: Surgical    Comment: tubal   Other Topics Concern   Not on file  Social History Narrative   Lives with husband and kids   Right handed   Drinks 9+ cups caffeine daily   Social Determinants of Health   Financial Resource Strain: Medium Risk (07/23/2020)   Overall Financial Resource Strain (CARDIA)    Difficulty of Paying Living Expenses: Somewhat hard  Food Insecurity: Food Insecurity Present (07/23/2020)   Hunger Vital Sign    Worried About [place] in the Last Year: Sometimes true    Ran  Out of Food in the Last Year: Sometimes true  Transportation Needs: No Transportation Needs (07/23/2020)   PRAPARE - Hydrologist (Medical): No    Lack of Transportation (Non-Medical): No  Physical Activity: Insufficiently Active (07/23/2020)   Exercise Vital Sign    Days of Exercise per Week: 1 day    Minutes of Exercise per Session: 30 min  Stress: No Stress Concern Present (07/23/2020)   [place]  Feeling of Stress : Not at all  Social Connections: Moderately Integrated (07/23/2020)   Social Connection and Isolation Panel [NHANES]    Frequency of Communication with Friends and Family: More than three times a week    Frequency of Social Gatherings with Friends and Family: Three times a week    Attends Religious Services: Never    Active Member of Clubs or Organizations: Yes    Attends Archivist Meetings: More than 4 times per year    Marital Status: Married  Human resources officer Violence: Not At Risk (07/23/2020)   Humiliation, Afraid, Rape, and Kick questionnaire    Fear of Current or Ex-Partner: No    Emotionally Abused: No    Physically Abused: No    Sexually Abused: No     Family History  Problem Relation Age of Onset   Thyroid disease Mother    Hypertension Mother    Hyperlipidemia Mother    CAD Mother    CVA Mother    Hyperlipidemia Father    Hypertension Father    CAD Father    Breast cancer Paternal Grandmother 54   Cancer Paternal Grandmother        lung    ROS: [x]  Positive   [ ]  Negative   [ ]  All sytems reviewed and are negative  Cardiovascular: []  chest pain/pressure []  palpitations []  SOB lying flat []  DOE []  pain in legs while walking []  pain in legs at rest []  pain in legs at night []  non-healing ulcers []  hx of DVT []  swelling in legs  Pulmonary: []  productive cough []  asthma/wheezing []  home O2  Neurologic: [x]  weakness in []  arms []   legs - right hand [x]  numbness in []  arms []  legs - right hand []  hx of CVA []  mini stroke [] difficulty speaking or slurred speech []  temporary loss of vision in one eye []  dizziness  Hematologic: []  hx of cancer []  bleeding problems []  problems with blood clotting easily  Endocrine:   []  diabetes []  thyroid disease  GI []  vomiting blood []  blood in stool  GU: []  CKD/renal failure []  HD--[]  M/W/F or []  T/T/S []  burning with urination []  blood in urine  Psychiatric: []  anxiety []  depression  Musculoskeletal: []  arthritis []  joint pain  Integumentary: []  rashes []  ulcers  Constitutional: []  fever []  chills   Physical Examination  Vitals:   11/18/21 0630 11/18/21 0730  BP: (!) 177/88 (!) 195/175  Pulse: 98 98  Resp: (!) 8 20  Temp:  98.9 F (37.2 C)  SpO2: 94% 95%   Body mass index is 33.28 kg/m.  General:  NAD Gait: Not observed HENT: WNL, normocephalic Pulmonary: normal non-labored breathing Cardiac: regular, without  Murmurs, rubs or gallops Abdomen:  soft, NT/ND Vascular Exam/Pulses: Right brachial pulse palpable but no palpable pulses at the wrist and unable to get any signals with Doppler Right hand grip strength weak with delayed capillary refill Left radial and brachial pulse palpable Bilateral femoral pulses palpable Extremities: with ischemic changes right hand Musculoskeletal: no muscle wasting or atrophy  Neurologic: Right hand weak grip strength   CBC    Component Value Date/Time   WBC 16.2 (H) 11/18/2021 0555   RBC 5.20 (H) 11/18/2021 0555   HGB 16.0 (H) 11/18/2021 0601   HCT 47.0 (H) 11/18/2021 0601   PLT 380 11/18/2021 0555   MCV 87.3 11/18/2021 0555   MCH 30.2 11/18/2021 0555   MCHC 34.6 11/18/2021 0555   RDW 12.8 11/18/2021 0555  LYMPHSABS 2.5 11/18/2021 0555   MONOABS 0.7 11/18/2021 0555   EOSABS 0.2 11/18/2021 0555   BASOSABS 0.1 11/18/2021 0555    BMET    Component Value Date/Time   NA 134 (L) 11/18/2021 0601    NA 135 11/17/2015 1153   K 4.4 11/18/2021 0601   CL 105 11/18/2021 0601   CO2 18 (L) 11/18/2021 0555   GLUCOSE 490 (H) 11/18/2021 0601   BUN 33 (H) 11/18/2021 0601   BUN 29 (H) 11/17/2015 1153   CREATININE 2.20 (H) 11/18/2021 0601   CALCIUM 9.0 11/18/2021 0555   GFRNONAA 29 (L) 11/18/2021 0555   GFRAA 31 (L) 02/01/2020 1328    COAGS: Lab Results  Component Value Date   INR 1.1 08/28/2021     Non-Invasive Vascular Imaging:    CTA reviewed with widely patent right subclavian, axillary, and brachial artery.  I do not feel and see any filling distally in the radial or ulnar artery.  ASSESSMENT/PLAN: This is a 41 y.o. female with multiple risk factors including diabetes, coronary artery disease status post MI, remote stroke, chronic kidney disease, CHF that presents with acutely ischemic right upper extremity.  I have recommended right upper extremity thrombectomy in the OR.  Please keep NPO.  Please continue heparin.  Discussed this is a limb threatening situation.  I have asked the ED to consult hospitalist for admission given all of her comorbidities and she will need cardioembolic work-up.  Marty Heck, MD [facility] [place] Office: [place]

## 2021-11-18 NOTE — ED Provider Notes (Signed)
[facility] EMERGENCY DEPARTMENT Provider Note   CSN: [id number] Arrival date & time: 11/18/21  0320     History  Chief Complaint  Patient presents with   Arm Pain    Lauren [name] is a 41 y.o. female.  41 year old female who presents the ER today with acute left-sided arm pain.  Patient states her tomorrow elbow to her hand.  Severe nature.  Started around 2:00.  Never anything at this before.  States that she has had her foot partially amputated and this is worse than that.  States compliance with her medications.  States it feels like a paresthesia initially but then progressively worsened to the severe pain that she has now.  Decreased grip strength because of pain.  She has a history of MI and stroke in the past as well takes Plavix.  No history of A-fib.  Not on any anticoagulation.  No trauma or injuries to the arm.   Arm Pain       Home Medications Prior to Admission medications   Medication Sig Start Date End Date Taking? Authorizing Provider  acetaminophen (TYLENOL) 500 MG tablet Take 1,500 mg by mouth every 6 (six) hours as needed for moderate pain or headache.    [provider]  atorvastatin (LIPITOR) 40 MG tablet TAKE 1 TABLET BY MOUTH ONCE DAILY. 09/24/21   Barrett, Evelene Croon, PA-C  clopidogrel (PLAVIX) 75 MG tablet Take 1 tablet (75 mg total) by mouth daily. 09/24/21   Barrett, Evelene Croon, PA-C  Continuous Blood Gluc Sensor (DEXCOM G6 SENSOR) MISC Change sensor every 10 days as directed 09/29/21   Brita Romp, NP  Continuous Blood Gluc Transmit (DEXCOM G6 TRANSMITTER) MISC Change transmitter every 90 days as directed. 09/29/21   Brita Romp, NP  fenofibrate 160 MG tablet TAKE 1 TABLET BY MOUTH ONCE DAILY. Patient taking differently: Take 160 mg by mouth daily. 10/01/20   Arnoldo Lenis, MD  furosemide (LASIX) 40 MG tablet Take 1 tablet (40 mg total) by mouth daily. Take daily or as directed 09/24/21   Barrett, Evelene Croon, PA-C  hydrALAZINE  (APRESOLINE) 25 MG tablet Take 1 & 1/2 tablets (37.5 mg total) by mouth 3 (three) times daily. 02/19/21   Arnoldo Lenis, MD  Insulin Disposable Pump (OMNIPOD 5 G6 INTRO, GEN 5,) KIT Change pod every 48-72 hrs 10/19/21   Brita Romp, NP  Insulin Disposable Pump (OMNIPOD 5 G6 POD, GEN 5,) MISC Change pod every 48-72 hours 10/19/21   Brita Romp, NP  insulin lispro (HUMALOG) 100 UNIT/ML injection Use with Omnipod for TDD around 60 units daily 11/16/21   Brita Romp, NP  Insulin Pen Needle 31G X 5 MM MISC Use to inject novolog and semglee as directed 08/25/20     isosorbide mononitrate (IMDUR) 30 MG 24 hr tablet Take 1 tablet (30 mg total) by mouth daily. 09/24/21 09/19/22  Barrett, Evelene Croon, PA-C  levothyroxine (SYNTHROID) 200 MCG tablet Take 1 tablet (200 mcg total) by mouth daily at 6 (six) AM. 09/06/21 10/06/21  Darliss Cheney, MD  metoprolol succinate (TOPROL-XL) 25 MG 24 hr tablet Take 1 tablet (25 mg total) by mouth daily. 09/24/21   Barrett, Evelene Croon, PA-C  nitroGLYCERIN (NITROSTAT) 0.4 MG SL tablet Place 1 tablet (0.4 mg total) under the tongue every 5 (five) minutes x 3 doses as needed for chest pain. 09/24/21   Barrett, Evelene Croon, PA-C  pantoprazole (PROTONIX) 40 MG tablet Take 1 tablet (40 mg total) by  mouth daily as needed for acid reflux 11/26/20     potassium chloride SA (KLOR-CON M) 20 MEQ tablet Take 1 tablet (20 mEq total) by mouth daily 09/24/21   Barrett, Evelene Croon, PA-C      Allergies    Wellbutrin [bupropion], Cefepime, Ciprofloxacin hcl, and Tape    Review of Systems   Review of Systems  Physical Exam Updated Vital Signs BP (!) 192/132   Pulse 96   Temp 97.8 F (36.6 C)   Resp 17   Ht 5' 5"  (1.651 m)   Wt 90.7 kg   SpO2 100%   BMI 33.28 kg/m  Physical Exam Vitals and nursing note reviewed.  Constitutional:      Appearance: She is well-developed.     Comments: Crying and writhing in pain  HENT:     Head: Normocephalic and atraumatic.     Mouth/Throat:      Mouth: Mucous membranes are moist.     Pharynx: Oropharynx is clear.  Eyes:     Pupils: Pupils are equal, round, and reactive to light.  Cardiovascular:     Rate and Rhythm: Normal rate and regular rhythm.     Comments: Not able to palpate radial or ulnar pulse in her right wrist Bedside Doppler without any signal in right wrist but able to get strong signal in the left Limited bedside ultrasound without any obvious flow in the radial artery not able to assess the ulnar When pulse ox is put on the left hand good Pleth right hand shows no Pleth Pulmonary:     Effort: Pulmonary effort is normal. No respiratory distress.     Breath sounds: No stridor.  Abdominal:     General: Abdomen is flat. There is no distension.  Musculoskeletal:        General: Normal range of motion.     Cervical back: Normal range of motion.  Skin:    General: Skin is warm and dry.     Comments: No obvious temperature difference in her right hand and arm versus left but is a different color: more dusky.  Decreased cap refill as well.  Neurological:     Mental Status: She is alert.     Comments: Difficult to assess neurologic function of her right hand as I suspect some of this decreased grip strength and overall strength could be related to decreased blood flow however also could just be related to pain.     ED Results / Procedures / Treatments   Labs (all labs ordered are listed, but only abnormal results are displayed) Labs Reviewed  CBC WITH DIFFERENTIAL/PLATELET - Abnormal; Notable for the following components:      Result Value   WBC 16.2 (*)    RBC 5.20 (*)    Hemoglobin 15.7 (*)    Neutro Abs 12.7 (*)    Abs Immature Granulocytes 0.10 (*)    All other components within normal limits  I-STAT CHEM 8, ED - Abnormal; Notable for the following components:   Sodium 134 (*)    BUN 33 (*)    Creatinine, Ser 2.20 (*)    Glucose, Bld 490 (*)    Calcium, Ion 1.11 (*)    TCO2 19 (*)    Hemoglobin  16.0 (*)    HCT 47.0 (*)    All other components within normal limits  COMPREHENSIVE METABOLIC PANEL    EKG None  Radiology No results found.  Procedures .Critical Care  Performed by: Merrily Pew, MD Authorized  by: Merrily Pew, MD   Critical care provider statement:    Critical care time (minutes):  30   Critical care was necessary to treat or prevent imminent or life-threatening deterioration of the following conditions:  Circulatory failure   Critical care was time spent personally by me on the following activities:  Development of treatment plan with patient or surrogate, discussions with consultants, evaluation of patient's response to treatment, examination of patient, ordering and review of laboratory studies, ordering and review of radiographic studies, ordering and performing treatments and interventions, pulse oximetry, re-evaluation of patient's condition and review of old charts     Medications Ordered in ED Medications  heparin ADULT infusion 100 units/mL (25000 units/267m) (has no administration in time range)  HYDROmorphone (DILAUDID) injection 1 mg (1 mg Intravenous Given 11/18/21 0554)  ondansetron (ZOFRAN) injection 4 mg (4 mg Intravenous Given 11/18/21 0620)    ED Course/ Medical Decision Making/ A&P                           Medical Decision Making Amount and/or Complexity of Data Reviewed Labs: ordered. Radiology: ordered.  Risk Prescription drug management.   I suspect patient has an acute arterial occlusion of her right arm likely above the AC fossa as it seems to affect both ulnar and radial arteries (possibly brachial artery) but does not seem to extend above her elbow very much.  Flexing or extending her arm does not change anything.  Patient with a poor GFR and per institutional guidelines CT angio is contraindicated.  I discussed with radiologist if there are any other studies or be appropriate and he felt that arterial Doppler could be  appropriate but not able to perform that at this time anywhere in our institution.  I discussed with vascular surgery, Dr. DScot [facility] who agrees with heparin for now as dissection is less likely than arterial occlusion.  [facility] after heparin is initiated.  Patient's pain is little bit more controlled.  Final Clinical Impression(s) / ED Diagnoses Final diagnoses:  Decreased radial pulse    Rx / DC Orders ED Discharge Orders     None         Cashius Grandstaff, JCorene Cornea MD 11/18/21 [phone]

## 2021-11-18 NOTE — Inpatient Diabetes Management (Signed)
Inpatient Diabetes Program Recommendations  AACE/ADA: New Consensus Statement on Inpatient Glycemic Control  Target Ranges:  Prepandial:   less than 140 mg/dL      Peak postprandial:   less than 180 mg/dL (1-2 hours)      Critically ill patients:  140 - 180 mg/dL    Latest Reference Range & Units 11/18/21 09:35 11/18/21 10:05 11/18/21 11:10  Glucose-Capillary 70 - 99 mg/dL 497 (H) 467 (H) 370 (H)    Latest Reference Range & Units 11/18/21 05:55 11/18/21 06:01  CO2 22 - 32 mmol/L 18 (L)   Glucose 70 - 99 mg/dL 493 (H) 490 (H)  Anion gap 5 - 15  11     Latest Reference Range & Units 09/29/21 08:47  Hemoglobin A1C 4.0 - 5.6 % 9.6   Review of Glycemic Control  Diabetes history: DM1 (does not make any insulin; requires basal, correction, and carbohydrate insulin) Outpatient Diabetes medications: Semglee 34 units daily, Novolog 4-10 units TID with meals, Dexcom CGM, Recently prescribed OmniPod 5 insulin pump Current orders for Inpatient glycemic control: IV insulin  Inpatient Diabetes Program Recommendations:    Insulin: Agree with using IV insulin at this time.  Once provider is ready to transition to SQ insulin, patient will require basal, correction, and carbohydrate coverage insulin.  NOTE: Noted patient is currently in OR for right upper extremity thrombectomy due to ischemic arm. Lab glucose 493 mg/dl on 11/18/21 at 5:55 am. Noted patient was started on IV insulin at 9:39 am today with glucose of 497 mg/dl at that time. Per chart, patient received Decadron 5 mg at 11:04 am today which will likely contribute to hyperglycemia. In reviewing chart, noted patient sees Abbe Amsterdam, NP [facility]) and was last seen 10/19/21. Per office note on 10/19/21, "She is advised to increase her dose of Semglee to 34 units SQ nightly, and adjust her Novolog to 4-10 units TID with meals if glucose is above 90 and she is eating (Specific instructions on how to titrate insulin dosage based on  glucose readings given to patient in writing).  I did prescribe Omnipod 5 today through [place]." Also noted telephone note on 11/16/21 by Abbe Amsterdam, NP which indicates that patient had insulin pump training scheduled for today at 4pm. Agree with using IV insulin. Once provider is ready to transition to SQ insulin, patient will require basal, correction, and carbohydrate coverage insulin.  Thanks, Barnie Alderman, RN, MSN, [place] Diabetes Coordinator Inpatient Diabetes Program [phone] (Team Pager from 8am to [place])

## 2021-11-18 NOTE — Anesthesia Preprocedure Evaluation (Addendum)
Anesthesia Evaluation  Patient identified by MRN, date of birth, ID band Patient awake    Reviewed: Allergy & Precautions, NPO status , Patient's Chart, lab work & pertinent test results, reviewed documented beta blocker date and time   Airway Mallampati: II  TM Distance: >3 FB Neck ROM: Full    Dental  (+) Missing, Dental Advisory Given, Chipped,    Pulmonary neg pulmonary ROS, former smoker,    Pulmonary exam normal breath sounds clear to auscultation       Cardiovascular hypertension, Pt. on home beta blockers and Pt. on medications + angina + CAD, + Past MI, + Peripheral Vascular Disease and +CHF  Normal cardiovascular exam Rhythm:Regular Rate:Normal  TTE 2022 1. The mid to distal anteroseptal wall is akinetic. The apex is akinetic  with some portions dyskinetic, there is no signs of apical thrombus with  use of echocontrast. The mid to distal inferoseptal wall is akinetic. Marland Kitchen  Left ventricular ejection fraction,  by estimation, is 35 to 40%. The left ventricle has moderately decreased  function. Left ventricular diastolic parameters are consistent with Grade  III diastolic dysfunction (restrictive). Elevated left atrial pressure.  2. Right ventricular systolic function is normal. The right ventricular  size is normal.  3. Left atrial size was mildly dilated.  4. The mitral valve is normal in structure. Mild mitral valve  regurgitation. No evidence of mitral stenosis.  5. The inferior vena cava is normal in size with greater than 50%  respiratory variability, suggesting right atrial pressure of 3 mmHg.  6. Limited echo to evaluate LV function and wall motion.    Neuro/Psych CVA negative psych ROS   GI/Hepatic negative GI ROS, Neg liver ROS,   Endo/Other  diabetes, Insulin DependentHypothyroidism   Renal/GU Renal InsufficiencyRenal diseaseLab Results      Component                Value               Date                       CREATININE               2.20 (H)            11/18/2021                BUN                      33 (H)              11/18/2021                NA                       134 (L)             11/18/2021                K                        4.4                 11/18/2021                CL                       105  11/18/2021                CO2                      18 (L)              11/18/2021             negative genitourinary   Musculoskeletal negative musculoskeletal ROS (+)   Abdominal   Peds  Hematology  (+) Blood dyscrasia (on plavix), anemia ,   Anesthesia Other Findings BG 467, plan to start endotool and given subQ insulin  Reproductive/Obstetrics                            Anesthesia Physical Anesthesia Plan  ASA: 4  Anesthesia Plan: General   Post-op Pain Management:    Induction: Intravenous  PONV Risk Score and Plan: 3 and Midazolam, Dexamethasone and Ondansetron  Airway Management Planned: Oral ETT  Additional Equipment:   Intra-op Plan:   Post-operative Plan: Extubation in OR  Informed Consent: I have reviewed the patients History and Physical, chart, labs and discussed the procedure including the risks, benefits and alternatives for the proposed anesthesia with the patient or authorized representative who has indicated his/her understanding and acceptance.     Dental advisory given  Plan Discussed with: CRNA  Anesthesia Plan Comments:         Anesthesia Quick Evaluation

## 2021-11-19 ENCOUNTER — Other Ambulatory Visit (HOSPITAL_COMMUNITY): Payer: Self-pay

## 2021-11-19 ENCOUNTER — Inpatient Hospital Stay (HOSPITAL_COMMUNITY): Payer: No Typology Code available for payment source

## 2021-11-19 ENCOUNTER — Encounter: Payer: No Typology Code available for payment source | Admitting: Podiatry

## 2021-11-19 ENCOUNTER — Telehealth: Payer: Self-pay | Admitting: Podiatry

## 2021-11-19 ENCOUNTER — Encounter (HOSPITAL_COMMUNITY): Payer: Self-pay | Admitting: Vascular Surgery

## 2021-11-19 DIAGNOSIS — I2510 Atherosclerotic heart disease of native coronary artery without angina pectoris: Secondary | ICD-10-CM

## 2021-11-19 DIAGNOSIS — N1832 Chronic kidney disease, stage 3b: Secondary | ICD-10-CM

## 2021-11-19 DIAGNOSIS — S98132A Complete traumatic amputation of one left lesser toe, initial encounter: Secondary | ICD-10-CM

## 2021-11-19 DIAGNOSIS — R0989 Other specified symptoms and signs involving the circulatory and respiratory systems: Secondary | ICD-10-CM | POA: Diagnosis not present

## 2021-11-19 DIAGNOSIS — R008 Other abnormalities of heart beat: Secondary | ICD-10-CM

## 2021-11-19 DIAGNOSIS — E039 Hypothyroidism, unspecified: Secondary | ICD-10-CM

## 2021-11-19 DIAGNOSIS — I10 Essential (primary) hypertension: Secondary | ICD-10-CM

## 2021-11-19 DIAGNOSIS — I70228 Atherosclerosis of native arteries of extremities with rest pain, other extremity: Secondary | ICD-10-CM | POA: Diagnosis not present

## 2021-11-19 DIAGNOSIS — E1059 Type 1 diabetes mellitus with other circulatory complications: Secondary | ICD-10-CM

## 2021-11-19 DIAGNOSIS — Z720 Tobacco use: Secondary | ICD-10-CM

## 2021-11-19 LAB — BASIC METABOLIC PANEL
Anion gap: 7 (ref 5–15)
BUN: 38 mg/dL — ABNORMAL HIGH (ref 6–20)
CO2: 19 mmol/L — ABNORMAL LOW (ref 22–32)
Calcium: 8 mg/dL — ABNORMAL LOW (ref 8.9–10.3)
Chloride: 108 mmol/L (ref 98–111)
Creatinine, Ser: 2.33 mg/dL — ABNORMAL HIGH (ref 0.44–1.00)
GFR, Estimated: 27 mL/min — ABNORMAL LOW (ref >60)
Glucose, Bld: 318 mg/dL — ABNORMAL HIGH (ref 70–99)
Potassium: 4.6 mmol/L (ref 3.5–5.1)
Sodium: 134 mmol/L — ABNORMAL LOW (ref 135–145)

## 2021-11-19 LAB — ECHOCARDIOGRAM COMPLETE
AR max vel: 2.73 cm2
AV Area VTI: 2.94 cm2
AV Area mean vel: 2.86 cm2
AV Mean grad: 4 mmHg
AV Peak grad: 7.5 mmHg
Ao pk vel: 1.37 m/s
Area-P 1/2: 6.12 cm2
Calc EF: 41.1 %
Height: 65 in
S' Lateral: 3.4 cm
Single Plane A2C EF: 38.6 %
Single Plane A4C EF: 40.7 %
Weight: 3188.73 oz

## 2021-11-19 LAB — SURGICAL PATHOLOGY

## 2021-11-19 LAB — GLUCOSE, CAPILLARY
Glucose-Capillary: 268 mg/dL — ABNORMAL HIGH (ref 70–99)
Glucose-Capillary: 269 mg/dL — ABNORMAL HIGH (ref 70–99)
Glucose-Capillary: 238 mg/dL — ABNORMAL HIGH (ref 70–99)
Glucose-Capillary: 335 mg/dL — ABNORMAL HIGH (ref 70–99)

## 2021-11-19 LAB — CBC
HCT: 36.3 % (ref 36.0–46.0)
Hemoglobin: 12.3 g/dL (ref 12.0–15.0)
MCH: 30 pg (ref 26.0–34.0)
MCHC: 33.9 g/dL (ref 30.0–36.0)
MCV: 88.5 fL (ref 80.0–100.0)
Platelets: 287 10*3/uL (ref 150–400)
RBC: 4.1 MIL/uL (ref 3.87–5.11)
RDW: 12.8 % (ref 11.5–15.5)
WBC: 21.5 10*3/uL — ABNORMAL HIGH (ref 4.0–10.5)
nRBC: 0 % (ref 0.0–0.2)

## 2021-11-19 LAB — HEPARIN LEVEL (UNFRACTIONATED)
Heparin Unfractionated: 0.42 IU/mL (ref 0.30–0.70)
Heparin Unfractionated: 0.35 IU/mL (ref 0.30–0.70)

## 2021-11-19 MED ORDER — PROSOURCE PLUS PO LIQD
30.0000 mL | Freq: Two times a day (BID) | ORAL | Status: DC
  Administered 2021-11-20 – 2021-11-21 (×3): 30 mL via ORAL
  Filled 2021-11-19 (×3): qty 30

## 2021-11-19 MED ORDER — ADULT MULTIVITAMIN W/MINERALS CH
1.0000 | ORAL_TABLET | Freq: Every day | ORAL | Status: DC
  Administered 2021-11-19 – 2021-11-21 (×3): 1 via ORAL
  Filled 2021-11-19 (×3): qty 1

## 2021-11-19 MED ORDER — INSULIN ASPART 100 UNIT/ML IJ SOLN
3.0000 [IU] | Freq: Three times a day (TID) | INTRAMUSCULAR | Status: DC
  Administered 2021-11-19 – 2021-11-20 (×3): 3 [IU] via SUBCUTANEOUS

## 2021-11-19 MED ORDER — PERFLUTREN LIPID MICROSPHERE
1.0000 mL | INTRAVENOUS | Status: AC | PRN
  Administered 2021-11-19: 3 mL via INTRAVENOUS

## 2021-11-19 NOTE — Progress Notes (Signed)
[facility]  PROGRESS NOTE  Lauren [name] JJH:[id number] DOB: 02-13-1981 DOA: 11/18/2021 PCP: Lemmie Evens, MD   Brief HPI:   41 year old female with medical history of CAD, chronic systolic heart failure, diabetes mellitus, CVA, stage III CKD presented with left arm pain.  Patient says that she was diabetic since age 52 she was scheduled for Zoom meeting to get approved for insulin pump.  Last hemoglobin A1c was 9.6.  She quit smoking 2 weeks ago.  She has chronic left foot wound with poor healing post amputation and is followed by podiatry.  She was due to have sutures removed today.  Patient says that she woke up at 2 AM with severe acute right forearm and hand pain.  It was cool to touch and has husband called EMS.   CTA of right upper extremity showed brachial artery embolus, vascular surgery was consulted and patient was taken to the OR for thrombectomy.    Subjective   Patient seen and examined, underwent successful upper extremity thrombectomy including the brachial, radial and ulnar artery.   Assessment/Plan:    S/p right brachial/radial/ulnar artery thrombectomy -Presented with thrombus in right upper extremity vessels -CTA of upper extremity showed brachial artery embolus -Underwent successful thrombectomy -Continue heparin -Vascular surgery following  Cardioembolic work-up -Concern for cardio embolic etiology -Transthoracic echocardiogram ordered for today -If TTE is negative, will consider TEE  Left toe amputation -Patient had recent left amputation with delayed healing -Podiatry was consulted by admitting provider and he said sutures can wait until next week -Continue Augmentin  Diabetes mellitus type 1 -Poor glycemic control -She is in the process of getting insulin pump -CBG mildly elevated -Continue Lantus, sliding scale insulin NovoLog -Add NovoLog 3 units 3 times daily meal coverage  CKD stage IIIb -Creatinine 2.33; at baseline -Bleeding  nephrology consultation as outpatient  Hyperlipidemia -Continue Lipitor, fenofibrate  Hypertension -Continue hydralazine, Toprol-XL  Hypothyroidism -Continue Synthroid  Tobacco dependence -Currently not smoking    Medications     amoxicillin-clavulanate  1 tablet Oral BID   atorvastatin  40 mg Oral QHS   docusate sodium  100 mg Oral BID   fenofibrate  160 mg Oral Daily   insulin aspart  0-15 Units Subcutaneous TID WC   insulin aspart  0-5 Units Subcutaneous QHS   insulin glargine-yfgn  34 Units Subcutaneous Daily   isosorbide mononitrate  30 mg Oral Daily   levothyroxine  175 mcg Oral QAC breakfast   metoprolol succinate  25 mg Oral Daily   sodium chloride flush  3 mL Intravenous Q12H     Data Reviewed:   CBG:  Recent Labs  Lab 11/18/21 1258 11/18/21 1659 11/18/21 2128 11/19/21 0608 11/19/21 1136  GLUCAP 265* 201* 419* 268* 269*    SpO2: 96 % O2 Flow Rate (L/min): 6 L/min    Vitals:   11/18/21 2346 11/19/21 0356 11/19/21 0825 11/19/21 1140  BP: (!) 126/93 (!) 133/91 (!) 148/65 (!) 144/55  Pulse: 86 86 84 76  Resp: 17 18 18 16   Temp: 98.4 F (36.9 C) 98.6 F (37 C) 98.3 F (36.8 C) 98.1 F (36.7 C)  TempSrc: Oral Oral Oral Oral  SpO2: 96% 97% 98% 96%  Weight:  90.4 kg    Height:          Data Reviewed:  Basic Metabolic Panel: Recent Labs  Lab 11/18/21 0555 11/18/21 0601 11/19/21 0149  NA 132* 134* 134*  K 4.2 4.4 4.6  CL 103 105 108  CO2 18*  --  19*  GLUCOSE 493* 490* 318*  BUN 31* 33* 38*  CREATININE 2.16* 2.20* 2.33*  CALCIUM 9.0  --  8.0*    CBC: Recent Labs  Lab 11/18/21 0555 11/18/21 0601 11/19/21 1037  WBC 16.2*  --  21.5*  NEUTROABS 12.7*  --   --   HGB 15.7* 16.0* 12.3  HCT 45.4 47.0* 36.3  MCV 87.3  --  88.5  PLT 380  --  287    LFT Recent Labs  Lab 11/18/21 0555  AST 14*  ALT 15  ALKPHOS 105  BILITOT 1.0  PROT 7.3  ALBUMIN 3.1*     Antibiotics: Anti-infectives (From admission, onward)    Start      Dose/Rate Route Frequency Ordered Stop   11/18/21 1430  amoxicillin-clavulanate (AUGMENTIN) 875-125 MG per tablet 1 tablet        1 tablet Oral 2 times daily 11/18/21 1330     11/18/21 1001  ceFAZolin (ANCEF) 2-4 GM/100ML-% IVPB       Note to Pharmacy: Cameron Sprang M: cabinet override      11/18/21 1001 11/18/21 2214        DVT prophylaxis: Heparin  Code Status: Full code  Family Communication: No family at bedside   CONSULTS vascular surgery   Objective    Physical Examination:   General-appears in no acute distress Heart-S1-S2, regular, no murmur auscultated Lungs-clear to auscultation bilaterally, no wheezing or crackles auscultated Abdomen-soft, nontender, no organomegaly Extremities-no edema in the lower extremities Neuro-alert, oriented x3, no focal deficit noted  Status is: Inpatient:             Oswald Hillock   [facility] If 7PM-7AM, please contact night-coverage at [email or web address], Office  [phone]   11/19/2021, 3:25 PM  LOS: 1 day

## 2021-11-19 NOTE — Progress Notes (Signed)
Initial Nutrition Assessment  DOCUMENTATION CODES:   Not applicable  INTERVENTION:   Prosource Plus 30 ml PO BID, each packet provides 100 kcal and 15 gm protein.  MVI with minerals daily.  Liberalize diet to increase options.   NUTRITION DIAGNOSIS:   Increased nutrient needs related to wound healing as evidenced by estimated needs.  GOAL:   Patient will meet greater than or equal to 90% of their needs  MONITOR:   PO intake, Supplement acceptance, Skin  REASON FOR ASSESSMENT:   Consult Other (Comment) (nutritional goals)  ASSESSMENT:   41 yo female admitted with ischemic R arm. PMH includes DM-1, CAD, CKD, remote stroke, CHF, recent L fifth toe amputation.  7/19 - R brachial, radial, and ulnar artery thrombectomy.   Patient reports good intake of meals. No nutrition issues PTA or currently. Weight history reviewed. Patient with 9% weight loss over the past 2.5 months. Patient has been trying to lose weight and lower her A1C. She has her new Omnipod insulin pump and is planning to start it when she is discharged home.   Discussed the importance of adequate protein to support wound healing. Patient agreed to take the Prosource Plus supplements BID. She does not like the taste, but willing to take it to increase protein intake.    Currently on a carb modified heart healthy diet. Meal intakes 75-100%. Patient states that her protein choices are limited on current carb modified heart healthy diet. Will liberalize to regular diet.    Labs reviewed. Na 134 CBG: [phone]  Medications reviewed and include Novolog, Semglee.  NUTRITION - FOCUSED PHYSICAL EXAM:  Flowsheet Row Most Recent Value  Orbital Region No depletion  Upper Arm Region No depletion  Thoracic and Lumbar Region No depletion  Buccal Region No depletion  Temple Region No depletion  Clavicle Bone Region No depletion  Clavicle and Acromion Bone Region No depletion  Scapular Bone Region No depletion   Dorsal Hand No depletion  Patellar Region No depletion  Anterior Thigh Region No depletion  Posterior Calf Region No depletion  Edema (RD Assessment) None  Hair Reviewed  Eyes Reviewed  Mouth Reviewed  Skin Reviewed  Nails Reviewed       Diet Order:   Diet Order             Diet heart healthy/carb modified Room service appropriate? Yes; Fluid consistency: Thin  Diet effective now                   EDUCATION NEEDS:   Education needs have been addressed  Skin:  Skin Assessment: Reviewed RN Assessment (surgical incisions R arm)  Last BM:  7/20  Height:   Ht Readings from Last 1 Encounters:  11/18/21 5\' 5"  (1.651 m)    Weight:   Wt Readings from Last 1 Encounters:  11/19/21 90.4 kg    Ideal Body Weight:  56.8 kg  BMI:  Body mass index is 33.16 kg/m.  Estimated Nutritional Needs:   Kcal:  2000-2200  Protein:  100-120 gm  Fluid:  2-2.2 L   Lucas Mallow RD, LDN, CNSC Please refer to Amion for contact information.

## 2021-11-19 NOTE — Progress Notes (Signed)
Echocardiogram 2D Echocardiogram has been performed.  Joette Catching 11/19/2021, 2:40 PM

## 2021-11-19 NOTE — Progress Notes (Signed)
[place] for Hepairin Indication: DVT  Allergies  Allergen Reactions   Wellbutrin [Bupropion] Hives   Cefepime Rash    Tolerates penicilllin   Ciprofloxacin Hcl Hives and Rash    Hives/rash at injection site    Tape Rash    Patient Measurements: Height: 5\' 5"  (165.1 cm) Weight: 90.4 kg (199 lb 4.7 oz) IBW/kg (Calculated) : 57  Heparin Dosing Weight: 77 kg  Vital Signs: Temp: 98.1 F (36.7 C) (07/20 1140) Temp Source: Oral (07/20 1140) BP: 144/55 (07/20 1140) Pulse Rate: 76 (07/20 1140)  Labs: Recent Labs    11/18/21 0555 11/18/21 0601 11/18/21 1523 11/18/21 1942 11/19/21 0149 11/19/21 1037 11/19/21 1041  HGB 15.7* 16.0*  --   --   --  12.3  --   HCT 45.4 47.0*  --   --   --  36.3  --   PLT 380  --   --   --   --  287  --   HEPARINUNFRC  --   --    < > 0.50 0.42  --  0.35  CREATININE 2.16* 2.20*  --   --  2.33*  --   --    < > = values in this interval not displayed.     Estimated Creatinine Clearance: 36 mL/min (A) (by C-G formula based on SCr of 2.33 mg/dL (H)).   Assessment: 41 year old female who presents to the ER with acute left-sided arm pain. Not on anticoagulation prior to admission. 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 Pharmacy consulted to manage heparin.   7/20 AM : the ~6 hr confirmatory heparin level obtained on 7/20 therapeutic at 0.42 on 1300 units/hr. Initial Hgb prior to heparin infusion 16.0 and and PLTS wnl though daily CBC not drawn yet. No issues with heparin infusion or signs of bleeding per RN.   Update: 11/19/21:  Next HL is 0.35, remains therapeutic on heparin 1300 units/hr.  Hgb 12.3 from 16.0 on admit and pltc wnl /stable. No bleeding reported.    Goal of Therapy:  Heparin level 0.3-0.7 units/ml Monitor platelets by anticoagulation protocol: Yes   Plan:  Continue heparin infusion at 1300 units/hr Daily heparin level and CBC while on  heparin  Follow up plans for oral anticoagulation    Thank you for allowing pharmacy to be a part of this patient's care.  Nicole Cella, RPh Clinical Pharmacist [phone]  11/19/2021  11:59 AM  Please check AMION for all [place] phone numbers After 10:00 PM, call [place] [phone]

## 2021-11-19 NOTE — Progress Notes (Addendum)
  Progress Note    11/19/2021 5:42 AM 1 Day Post-Op  Subjective:  tender around the incision.  Hand feels much better  Afebrile HR 70's-90's 017'C-944'H systolic 67% RA  Vitals:   11/18/21 2346 11/19/21 0356  BP: (!) 126/93 (!) 133/91  Pulse: 86 86  Resp: 17 18  Temp: 98.4 F (36.9 C) 98.6 F (37 C)  SpO2: 96% 97%    Physical Exam: Cardiac:  regular Lungs:  non labored Incisions:  clean and dry Extremities:  easily palpable right radial pulse; motor and sensory are in tact   CBC    Component Value Date/Time   WBC 16.2 (H) 11/18/2021 0555   RBC 5.20 (H) 11/18/2021 0555   HGB 16.0 (H) 11/18/2021 0601   HCT 47.0 (H) 11/18/2021 0601   PLT 380 11/18/2021 0555   MCV 87.3 11/18/2021 0555   MCH 30.2 11/18/2021 0555   MCHC 34.6 11/18/2021 0555   RDW 12.8 11/18/2021 0555   LYMPHSABS 2.5 11/18/2021 0555   MONOABS 0.7 11/18/2021 0555   EOSABS 0.2 11/18/2021 0555   BASOSABS 0.1 11/18/2021 0555    BMET    Component Value Date/Time   NA 134 (L) 11/19/2021 0149   NA 135 11/17/2015 1153   K 4.6 11/19/2021 0149   CL 108 11/19/2021 0149   CO2 19 (L) 11/19/2021 0149   GLUCOSE 318 (H) 11/19/2021 0149   BUN 38 (H) 11/19/2021 0149   BUN 29 (H) 11/17/2015 1153   CREATININE 2.33 (H) 11/19/2021 0149   CALCIUM 8.0 (L) 11/19/2021 0149   GFRNONAA 27 (L) 11/19/2021 0149   GFRAA 31 (L) 02/01/2020 1328    INR    Component Value Date/Time   INR 1.1 08/28/2021 0001     Intake/Output Summary (Last 24 hours) at 11/19/2021 0542 Last data filed at 11/18/2021 1901 Gross per 24 hour  Intake 1058.75 ml  Output 20 ml  Net 1038.75 ml     Assessment/Plan:  40 y.o. female is s/p:  Right brachial, radial, and ulnar artery thrombectomy  1 Day Post-Op   -pt doing well with palpable right radial pulse and motor and sensory are in tact. -continue heparin gtt -2D echo ordered    Leontine Locket, PA-C [facility] [phone] 11/19/2021 5:42 AM  I have  seen and evaluated the patient. I agree with the PA note as documented above.  Postop day 1 status post right upper extremity thrombectomy including the brachial, radial and ulnar artery.  Incision looks good at the antecubitum.  Palpable radial pulse at the wrist.  Pain has completely resolved.  Will need discharge on DOAC.  Cardioembolic work-up pending with echocardiogram etc.  Marty Heck, MD [facility] Office: [phone]

## 2021-11-19 NOTE — Inpatient Diabetes Management (Signed)
Inpatient Diabetes Program Recommendations  AACE/ADA: New Consensus Statement on Inpatient Glycemic Control (2015)  Target Ranges:  Prepandial:   less than 140 mg/dL      Peak postprandial:   less than 180 mg/dL (1-2 hours)      Critically ill patients:  140 - 180 mg/dL   Lab Results  Component Value Date   GLUCAP 269 (H) 11/19/2021   HGBA1C 9.6 09/29/2021    Latest Reference Range & Units 11/18/21 11:10 11/18/21 12:00 11/18/21 12:58 11/18/21 16:59 11/18/21 21:28 11/19/21 06:08 11/19/21 11:36  Glucose-Capillary 70 - 99 mg/dL 370 (H) 358 (H) 265 (H) 201 (H) 419 (H) 268 (H) 269 (H)  (H): Data is abnormally high Review of Glycemic Control  Diabetes history: DM1 (does not make any insulin; requires basal, correction, and carbohydrate insulin) Outpatient Diabetes medications: Semglee 34 units daily, Novolog 4-10 units TID with meals, Dexcom CGM, Recently prescribed OmniPod 5 insulin pump Current orders for Inpatient glycemic control: Semglee 34 units at HS, Novolog MODERATE correction scale TID & HS scale  Inpatient Diabetes Program Recommendations:   Noted that patient's blood sugars have been greater than 180 mg/dl. Recommend adding Novolog 3 units TID with meals if eating at least 50% of meal and if blood sugars continue to be elevated. Titrate dosages as needed.  Harvel Ricks RN BSN CDE Diabetes Coordinator Pager: [phone]  8am-5pm

## 2021-11-19 NOTE — Progress Notes (Signed)
[facility] for Hepairin Indication: DVT  Allergies  Allergen Reactions   Wellbutrin [Bupropion] Hives   Cefepime Rash    Tolerates penicilllin   Ciprofloxacin Hcl Hives and Rash    Hives/rash at injection site    Tape Rash    Patient Measurements: Height: 5\' 5"  (165.1 cm) Weight: 90.4 kg (199 lb 4.7 oz) IBW/kg (Calculated) : 57  Heparin Dosing Weight: 77 kg  Vital Signs: Temp: 98.3 F (36.8 C) (07/20 0825) Temp Source: Oral (07/20 0825) BP: 148/65 (07/20 0825) Pulse Rate: 84 (07/20 0825)  Labs: Recent Labs    11/18/21 0555 11/18/21 0601 11/18/21 1523 11/18/21 1942 11/19/21 0149  HGB 15.7* 16.0*  --   --   --   HCT 45.4 47.0*  --   --   --   PLT 380  --   --   --   --   HEPARINUNFRC  --   --  0.98* 0.50 0.42  CREATININE 2.16* 2.20*  --   --  2.33*     Estimated Creatinine Clearance: 36 mL/min (A) (by C-G formula based on SCr of 2.33 mg/dL (H)).   Assessment: 41 year old female who presents to the ER with acute left-sided arm pain. Not on anticoagulation prior to admission. 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 Pharmacy consulted to manage heparin.   ~6 hr confirmatory heparin level obtained on 7/20 therapeutic at 0.42 on 1300 units/hr. Initial Hgb prior to heparin infusion 16.0 and and PLTS wnl though daily CBC not drawn yet. No issues with heparin infusion or signs of bleeding per RN.    Goal of Therapy:  Heparin level 0.3-0.7 units/ml Monitor platelets by anticoagulation protocol: Yes   Plan:  Continue heparin infusion at 1300 units/hr Check daily heparin level and CBC now Daily heparin level and CBC while on heparin  Follow up plans for oral anticoagulation    Thank you for allowing pharmacy to be a part of this patient's care.  Eliseo Gum, PharmD PGY1 Pharmacy Resident   11/19/2021  9:07 AM

## 2021-11-19 NOTE — Telephone Encounter (Signed)
Patients husband called stating Lauren [name] is currently in the hospital and would like for you to go see her since she is unable to make her post op appointment.   I let him know that you are currently in clinic, but I would notify you about her hospitalization.   Please advise.

## 2021-11-20 ENCOUNTER — Other Ambulatory Visit (HOSPITAL_COMMUNITY): Payer: Self-pay

## 2021-11-20 ENCOUNTER — Telehealth (HOSPITAL_COMMUNITY): Payer: Self-pay | Admitting: Pharmacy Technician

## 2021-11-20 DIAGNOSIS — R0989 Other specified symptoms and signs involving the circulatory and respiratory systems: Secondary | ICD-10-CM | POA: Diagnosis not present

## 2021-11-20 DIAGNOSIS — I2510 Atherosclerotic heart disease of native coronary artery without angina pectoris: Secondary | ICD-10-CM | POA: Diagnosis not present

## 2021-11-20 DIAGNOSIS — I70228 Atherosclerosis of native arteries of extremities with rest pain, other extremity: Secondary | ICD-10-CM | POA: Diagnosis not present

## 2021-11-20 DIAGNOSIS — S98132A Complete traumatic amputation of one left lesser toe, initial encounter: Secondary | ICD-10-CM | POA: Diagnosis not present

## 2021-11-20 LAB — GLUCOSE, CAPILLARY
Glucose-Capillary: 258 mg/dL — ABNORMAL HIGH (ref 70–99)
Glucose-Capillary: 180 mg/dL — ABNORMAL HIGH (ref 70–99)
Glucose-Capillary: 250 mg/dL — ABNORMAL HIGH (ref 70–99)
Glucose-Capillary: 180 mg/dL — ABNORMAL HIGH (ref 70–99)

## 2021-11-20 LAB — BETA-2-GLYCOPROTEIN I ABS, IGG/M/A
Beta-2 Glyco I IgG: <9 GPI IgG units (ref 0–20)
Beta-2-Glycoprotein I IgA: <9 GPI IgA units (ref 0–25)
Beta-2-Glycoprotein I IgM: <9 GPI IgM units (ref 0–32)

## 2021-11-20 LAB — CBC
HCT: 38.6 % (ref 36.0–46.0)
Hemoglobin: 13.2 g/dL (ref 12.0–15.0)
MCH: 30.2 pg (ref 26.0–34.0)
MCHC: 34.2 g/dL (ref 30.0–36.0)
MCV: 88.3 fL (ref 80.0–100.0)
Platelets: 299 10*3/uL (ref 150–400)
RBC: 4.37 MIL/uL (ref 3.87–5.11)
RDW: 12.9 % (ref 11.5–15.5)
WBC: 14.7 10*3/uL — ABNORMAL HIGH (ref 4.0–10.5)
nRBC: 0 % (ref 0.0–0.2)

## 2021-11-20 LAB — BASIC METABOLIC PANEL
Anion gap: 9 (ref 5–15)
BUN: 35 mg/dL — ABNORMAL HIGH (ref 6–20)
CO2: 21 mmol/L — ABNORMAL LOW (ref 22–32)
Calcium: 8.1 mg/dL — ABNORMAL LOW (ref 8.9–10.3)
Chloride: 108 mmol/L (ref 98–111)
Creatinine, Ser: 2.22 mg/dL — ABNORMAL HIGH (ref 0.44–1.00)
GFR, Estimated: 28 mL/min — ABNORMAL LOW (ref >60)
Glucose, Bld: 208 mg/dL — ABNORMAL HIGH (ref 70–99)
Potassium: 4 mmol/L (ref 3.5–5.1)
Sodium: 138 mmol/L (ref 135–145)

## 2021-11-20 LAB — CARDIOLIPIN ANTIBODIES, IGG, IGM, IGA
Anticardiolipin IgA: <9 APL U/mL (ref 0–11)
Anticardiolipin IgG: <9 GPL U/mL (ref 0–14)
Anticardiolipin IgM: <9 MPL U/mL (ref 0–12)

## 2021-11-20 LAB — HEPARIN LEVEL (UNFRACTIONATED)
Heparin Unfractionated: 0.23 IU/mL — ABNORMAL LOW (ref 0.30–0.70)
Heparin Unfractionated: 0.39 IU/mL (ref 0.30–0.70)

## 2021-11-20 LAB — PROTEIN C, TOTAL: Protein C, Total: 102 % (ref 60–150)

## 2021-11-20 LAB — HOMOCYSTEINE: Homocysteine: 21.5 umol/L — ABNORMAL HIGH (ref 0.0–14.5)

## 2021-11-20 MED ORDER — APIXABAN 5 MG PO TABS: 5.0000 mg | ORAL_TABLET | Freq: Two times a day (BID) | ORAL | Status: DC

## 2021-11-20 MED ORDER — APIXABAN 5 MG PO TABS
10.0000 mg | ORAL_TABLET | Freq: Two times a day (BID) | ORAL | Status: DC
  Administered 2021-11-20 – 2021-11-21 (×3): 10 mg via ORAL
  Filled 2021-11-20 (×3): qty 2

## 2021-11-20 MED ORDER — APIXABAN (ELIQUIS) VTE STARTER PACK (10MG AND 5MG)
ORAL_TABLET | ORAL | 0 refills | Status: DC
  Filled 2021-11-20: qty 74, 30d supply, fill #0

## 2021-11-20 MED ORDER — INSULIN ASPART 100 UNIT/ML IJ SOLN
5.0000 [IU] | Freq: Three times a day (TID) | INTRAMUSCULAR | Status: DC
  Administered 2021-11-20 – 2021-11-21 (×2): 5 [IU] via SUBCUTANEOUS

## 2021-11-20 MED ORDER — INSULIN GLARGINE-YFGN 100 UNIT/ML ~~LOC~~ SOLN
40.0000 [IU] | Freq: Every day | SUBCUTANEOUS | Status: DC
  Administered 2021-11-21: 40 [IU] via SUBCUTANEOUS
  Filled 2021-11-20: qty 0.4

## 2021-11-20 NOTE — Evaluation (Signed)
Occupational Therapy Evaluation Patient Details Name: Lauren [name] MRN: [id number] DOB: June 06, 1980 Today's Date: 11/20/2021   History of Present Illness 41 y.o. F admitted on 11/18/21 due to ischemia of RUE. On 11/18/21 pt underwent a brachial, radial, and ulnar thrombectomy. PMH significant for CAD; chronic systolic CHF; DM; CVA; and stage 3 CKD.    Clinical Impression   Pt admitted for concerns listed above. 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 At this time, pt presents with pain, increased weakness in RUE, and numbness in RUE. She is able to complete all BADL's with no assist at this time, however pt most likely has some deficits with  fine motor coordination. OT provided pt with exercises, red theraputty, and a squeeze ball to assist with improving function of RUE. OT will continue to follow acutely.       Recommendations for follow up therapy are one component of a multi-disciplinary discharge planning process, led by the attending physician.  Recommendations may be updated based on patient status, additional functional criteria and insurance authorization.   Follow Up Recommendations  No OT follow up    Assistance Recommended at Discharge PRN  Patient can return home with the following A little help with bathing/dressing/bathroom;Assistance with cooking/housework    Functional Status Assessment  Patient has had a recent decline in their functional status and demonstrates the ability to make significant improvements in function in a reasonable and predictable amount of time.  Equipment Recommendations  None recommended by OT    Recommendations for Other Services       Precautions / Restrictions Precautions Precautions: Other (comment) Precaution Comments: WB in boot of LLE Required Braces or Orthoses: Other Brace Other Brace: Boot for LLE Restrictions Weight Bearing  Restrictions: Yes LLE Weight Bearing: Partial weight bearing LLE Partial Weight Bearing Percentage or Pounds: Has to wear boot      Mobility Bed Mobility Overal bed mobility: Modified Independent                  Transfers Overall transfer level: Modified independent Equipment used: None                      Balance Overall balance assessment: No apparent balance deficits (not formally assessed)                                         ADL either performed or assessed with clinical judgement   ADL Overall ADL's : Modified independent                                       General ADL Comments: Pt able to complete ADL's with no assist, anticipate pt will have difficulty with buttons, zippers, and clasps     Vision Baseline Vision/History: 1 Wears glasses Ability to See in Adequate Light: 0 Adequate Patient Visual Report: No change from baseline Vision Assessment?: No apparent visual deficits     Perception     Praxis      Pertinent Vitals/Pain Pain Assessment Pain Assessment: No/denies pain     Hand Dominance Right   Extremity/Trunk Assessment Upper Extremity Assessment Upper Extremity Assessment: RUE deficits/detail RUE Deficits / Details: Limited mobility from incision in  elbow. RUE Sensation: decreased light touch RUE Coordination: decreased fine motor;decreased gross motor   Lower Extremity Assessment Lower Extremity Assessment: Overall WFL for tasks assessed   Cervical / Trunk Assessment Cervical / Trunk Assessment: Normal   Communication Communication Communication: No difficulties   Cognition Arousal/Alertness: Awake/alert Behavior During Therapy: WFL for tasks assessed/performed Overall Cognitive Status: Within Functional Limits for tasks assessed                                       General Comments  VSS on RA    Exercises     Shoulder Instructions      Home Living  Family/patient expects to be discharged to:: Private residence Living Arrangements: Spouse/significant other;Children Available Help at Discharge: Family;Available 24 hours/day Type of Home: Mobile home Home Access: Stairs to enter Entrance Stairs-Number of Steps: 5 Entrance Stairs-Rails: Right;Left Home Layout: One level     Bathroom Shower/Tub: Occupational psychologist: Standard     Home Equipment: None          Prior Functioning/Environment Prior Level of Function : Independent/Modified Independent;Driving;Working/employed                        OT Problem List: Decreased strength;Decreased range of motion;Decreased coordination;Impaired UE functional use      OT Treatment/Interventions: Self-care/ADL training;Therapeutic exercise;Therapeutic activities    OT Goals(Current goals can be found in the care plan section) Acute Rehab OT Goals Patient Stated Goal: To go back to work OT Goal Formulation: With patient Time For Goal Achievement: 12/04/21 Potential to Achieve Goals: Good ADL Goals Pt/caregiver will Perform Home Exercise Program: Increased ROM;Increased strength;Right Upper extremity;With theraband;With theraputty;Independently Additional ADL Goal #1: Patient will complete fine motor tasks (ie buttons, pant zippers, shoe laces) with independence.  OT Frequency: Min 2X/week    Co-evaluation              AM-PAC OT "6 Clicks" Daily Activity     Outcome Measure Help from another person eating meals?: None Help from another person taking care of personal grooming?: None Help from another person toileting, which includes using toliet, bedpan, or urinal?: None Help from another person bathing (including washing, rinsing, drying)?: None Help from another person to put on and taking off regular upper body clothing?: None Help from another person to put on and taking off regular lower body clothing?: None 6 Click Score: 24   End of Session  Nurse Communication: Mobility status  Activity Tolerance: Patient tolerated treatment well Patient left: in bed;with call bell/phone within reach;with family/visitor present  OT Visit Diagnosis: Unsteadiness on feet (R26.81);Other abnormalities of gait and mobility (R26.89);Muscle weakness (generalized) (M62.81)                Time: 2694-8546 OT Time Calculation (min): 18 min Charges:  OT General Charges $OT Visit: 1 Visit OT Evaluation $OT Eval Moderate Complexity: 1 Mod  Bertice Risse H., OTR/L Acute Rehabilitation  [place] Yolanda Bonine 11/20/2021, 4:34 PM

## 2021-11-20 NOTE — Progress Notes (Addendum)
  Progress Note    11/20/2021 10:34 AM 2 Days Post-Op  Subjective:  Patient is doing okay. R arm incision is sore.    Vitals:   11/20/21 0352 11/20/21 0810  BP: (!) 142/48 108/70  Pulse: 91 76  Resp: 16 16  Temp: 98.4 F (36.9 C) 98.3 F (36.8 C)  SpO2: 99% 97%    Physical Exam: Lungs: nonlabored Incisions:  R bicep incision c/d/l with minimal swelling Extremities:  Radial 2+ pulse in R arm. Motor and sensation intact.   CBC    Component Value Date/Time   WBC 14.7 (H) 11/20/2021 0159   RBC 4.37 11/20/2021 0159   HGB 13.2 11/20/2021 0159   HCT 38.6 11/20/2021 0159   PLT 299 11/20/2021 0159   MCV 88.3 11/20/2021 0159   MCH 30.2 11/20/2021 0159   MCHC 34.2 11/20/2021 0159   RDW 12.9 11/20/2021 0159   LYMPHSABS 2.5 11/18/2021 0555   MONOABS 0.7 11/18/2021 0555   EOSABS 0.2 11/18/2021 0555   BASOSABS 0.1 11/18/2021 0555    BMET    Component Value Date/Time   NA 138 11/20/2021 0159   NA 135 11/17/2015 1153   K 4.0 11/20/2021 0159   CL 108 11/20/2021 0159   CO2 21 (L) 11/20/2021 0159   GLUCOSE 208 (H) 11/20/2021 0159   BUN 35 (H) 11/20/2021 0159   BUN 29 (H) 11/17/2015 1153   CREATININE 2.22 (H) 11/20/2021 0159   CALCIUM 8.1 (L) 11/20/2021 0159   GFRNONAA 28 (L) 11/20/2021 0159   GFRAA 31 (L) 02/01/2020 1328    INR    Component Value Date/Time   INR 1.1 08/28/2021 0001     Intake/Output Summary (Last 24 hours) at 11/20/2021 1034 Last data filed at 11/20/2021 0047 Gross per 24 hour  Intake 476.6 ml  Output --  Net 476.6 ml     Assessment/Plan:  41 y.o. female is 2 days post op, s/p: Right brachial, radial, and ulnar artery thrombectomy     - R arm doing well. Palpable 2+ radial pulse with good motor and sensation in the hand. R bicep incision c/d/l. - Continue heparin drip   Vicente Serene, PA-C [facility] [phone] 11/20/2021 10:34 AM  VASCULAR STAFF ADDENDUM: I have independently interviewed and examined the  patient. I agree with the above.  Echocardiogram completed. Needs CT of chest. This can be done as an outpatient. Safe to transition to oral anticoagulation from my standpoint.  OT ordered.  Yevonne Aline. Stanford Breed, MD [facility] Phone Number: [phone] 11/20/2021 12:00 PM

## 2021-11-20 NOTE — Progress Notes (Signed)
[facility]  PROGRESS NOTE  Lauren [name] FXT:[id number] DOB: [birth year] DOA: 11/18/2021 PCP: Lemmie Evens, MD   Brief HPI:   41 year old female with medical history of CAD, chronic systolic heart failure, diabetes mellitus, CVA, stage III CKD presented with left arm pain.  Patient says that she was diabetic since age 49 she was scheduled for Zoom meeting to get approved for insulin pump.  Last hemoglobin A1c was 9.6.  She quit smoking 2 weeks ago.  She has chronic left foot wound with poor healing post amputation and is followed by podiatry.  She was due to have sutures removed today.  Patient says that she woke up at 2 AM with severe acute right forearm and hand pain.  It was cool to touch and has husband called EMS.   CTA of right upper extremity showed brachial artery embolus, vascular surgery was consulted and patient was taken to the OR for thrombectomy.    Subjective   Patient seen and examined, transthoracic echocardiogram done.  Echo shows no thrombus.   Assessment/Plan:    S/p right brachial/radial/ulnar artery thrombectomy -Presented with thrombus in right upper extremity vessels -CTA of upper extremity showed brachial artery embolus -Underwent successful thrombectomy including the brachial, radial and ulnar arteries -We will transition heparin to Eliquis per pharmacy -Vascular surgery following  Cardioembolic work-up -Concern for cardio embolic etiology -Transthoracic echocardiogram is negative for thrombus -Patient will follow-up with vascular surgery as outpatient for further imaging studies as needed  Left toe amputation -Patient had recent left amputation with delayed healing -Podiatry was consulted by admitting provider and he said sutures can wait until next week -Continue Augmentin  Diabetes mellitus type 1 -Poor glycemic control -She is in the process of getting insulin pump -CBG continues to be elevated  -Continue Lantus, sliding scale insulin  NovoLog -Increase Lantus to 40 units subcu daily -Increase NovoLog to 5 units 3 times daily meal coverage   CKD stage IIIb -Creatinine 2.33; at baseline --She will need nephrology consultation as outpatient  Hyperlipidemia -Continue Lipitor, fenofibrate  Hypertension -Continue hydralazine, Toprol-XL  Hypothyroidism -Continue Synthroid  Tobacco dependence -Currently not smoking    Medications     (feeding supplement) PROSource Plus  30 mL Oral BID BM   amoxicillin-clavulanate  1 tablet Oral BID   apixaban  10 mg Oral BID   Followed by   Derrill Memo ON 11/27/2021] apixaban  5 mg Oral BID   atorvastatin  40 mg Oral QHS   docusate sodium  100 mg Oral BID   fenofibrate  160 mg Oral Daily   insulin aspart  0-15 Units Subcutaneous TID WC   insulin aspart  0-5 Units Subcutaneous QHS   insulin aspart  5 Units Subcutaneous TID WC   [START ON 11/21/2021] insulin glargine-yfgn  40 Units Subcutaneous Daily   isosorbide mononitrate  30 mg Oral Daily   levothyroxine  175 mcg Oral QAC breakfast   metoprolol succinate  25 mg Oral Daily   multivitamin with minerals  1 tablet Oral Daily   sodium chloride flush  3 mL Intravenous Q12H     Data Reviewed:   CBG:  Recent Labs  Lab 11/19/21 1136 11/19/21 1645 11/19/21 2109 11/20/21 0618 11/20/21 1147  GLUCAP 269* 238* 335* 258* 180*    SpO2: 96 % O2 Flow Rate (L/min): 6 L/min    Vitals:   11/19/21 2344 11/20/21 0352 11/20/21 0810 11/20/21 1150  BP: (!) 124/50 (!) 142/48 108/70 106/60  Pulse: 82 91 76 74  Resp: 16 16 16 16   Temp: 98.5 F (36.9 C) 98.4 F (36.9 C) 98.3 F (36.8 C) 98.2 F (36.8 C)  TempSrc: Oral Oral Oral Oral  SpO2: 97% 99% 97% 96%  Weight:      Height:          Data Reviewed:  Basic Metabolic Panel: Recent Labs  Lab 11/18/21 0555 11/18/21 0601 11/19/21 0149 11/20/21 0159  NA 132* 134* 134* 138  K 4.2 4.4 4.6 4.0  CL 103 105 108 108  CO2 18*  --  19* 21*  GLUCOSE 493* 490* 318* 208*  BUN  31* 33* 38* 35*  CREATININE 2.16* 2.20* 2.33* 2.22*  CALCIUM 9.0  --  8.0* 8.1*    CBC: Recent Labs  Lab 11/18/21 0555 11/18/21 0601 11/19/21 1037 11/20/21 0159  WBC 16.2*  --  21.5* 14.7*  NEUTROABS 12.7*  --   --   --   HGB 15.7* 16.0* 12.3 13.2  HCT 45.4 47.0* 36.3 38.6  MCV 87.3  --  88.5 88.3  PLT 380  --  287 299    LFT Recent Labs  Lab 11/18/21 0555  AST 14*  ALT 15  ALKPHOS 105  BILITOT 1.0  PROT 7.3  ALBUMIN 3.1*     Antibiotics: Anti-infectives (From admission, onward)    Start     Dose/Rate Route Frequency Ordered Stop   11/18/21 1430  amoxicillin-clavulanate (AUGMENTIN) 875-125 MG per tablet 1 tablet        1 tablet Oral 2 times daily 11/18/21 1330     11/18/21 1001  ceFAZolin (ANCEF) 2-4 GM/100ML-% IVPB       Note to Pharmacy: Cameron Sprang M: cabinet override      11/18/21 1001 11/18/21 2214        DVT prophylaxis: Heparin  Code Status: Full code  Family Communication: No family at bedside   CONSULTS vascular surgery   Objective    Physical Examination:   General-appears in no acute distress Heart-S1-S2, regular, no murmur auscultated Lungs-clear to auscultation bilaterally, no wheezing or crackles auscultated Abdomen-soft, nontender, no organomegaly Extremities-no edema in the lower extremities Neuro-alert, oriented x3, no focal deficit noted  Status is: Inpatient:             Oswald Hillock   [facility] If 7PM-7AM, please contact night-coverage at [email or web address], Office  [phone]   11/20/2021, 1:47 PM  LOS: 2 days

## 2021-11-20 NOTE — Progress Notes (Signed)
ANTICOAGULATION CONSULT NOTE - Follow Up Consult  Pharmacy Consult for heparin Indication: DVT now s/p thrombectomy  Labs: Recent Labs    11/18/21 0555 11/18/21 0601 11/18/21 1523 11/19/21 0149 11/19/21 1037 11/19/21 1041 11/20/21 0159  HGB 15.7* 16.0*  --   --  12.3  --  13.2  HCT 45.4 47.0*  --   --  36.3  --  38.6  PLT 380  --   --   --  287  --  299  HEPARINUNFRC  --   --    < > 0.42  --  0.35 0.23*  CREATININE 2.16* 2.20*  --  2.33*  --   --  2.22*   < > = values in this interval not displayed.    Assessment: 41yo female subtherapeutic on heparin after three levels at goal though had been trending down; no infusion issues or signs of bleeding per RN.  Goal of Therapy:  Heparin level 0.3-0.7 units/ml   Plan:  Will increase heparin infusion by 2-3 units/kg/hr to 1500 units/hr and check level in 8 hours.    Wynona Neat, PharmD, BCPS  11/20/2021,3:44 AM

## 2021-11-20 NOTE — TOC Benefit Eligibility Note (Signed)
Patient Teacher, English as a foreign language completed.    The patient is currently admitted and upon discharge could be taking Eliquis 5 mg.  The current 30 day co-pay is, $104.36.   The patient is currently admitted and upon discharge could be taking Xarelto 20 mg.  The current 30 day co-pay is, $105.54.  The patient is currently admitted and upon discharge could be taking warfarin (Coumadin) 5 mg.  The current 30 day co-pay is, $5.00.   The patient is insured through [place] [place] Patient Advocate Specialist [place] Patient Advocate Team Direct Number: [phone]  Fax: [phone]

## 2021-11-20 NOTE — Telephone Encounter (Signed)
Pharmacy Patient Advocate Encounter  Insurance verification completed.    The patient is insured through [employer]   The patient is currently admitted and ran test claims for the following: Eliquis, Xarelto, Warfarin.  Copays and coinsurance results were relayed to Inpatient clinical team.

## 2021-11-20 NOTE — Progress Notes (Signed)
ANTICOAGULATION CONSULT NOTE - Follow Up Consult  Pharmacy Consult for heparin Indication: DVT now s/p thrombectomy  Labs: Recent Labs    11/18/21 0555 11/18/21 0601 11/18/21 1523 11/19/21 0149 11/19/21 1037 11/19/21 1041 11/20/21 0159 11/20/21 1156  HGB 15.7* 16.0*  --   --  12.3  --  13.2  --   HCT 45.4 47.0*  --   --  36.3  --  38.6  --   PLT 380  --   --   --  287  --  299  --   HEPARINUNFRC  --   --    < > 0.42  --  0.35 0.23* 0.39  CREATININE 2.16* 2.20*  --  2.33*  --   --  2.22*  --    < > = values in this interval not displayed.     Assessment: 41yo female therapeutic on heparin infusion,  HL is  0.39 after heparin rate increased to 1500 units/hr.  Now phamacy asked to transition to Eliquis for brachial thrombectomy.   Goal of Therapy:  Heparin level 0.3-0.7 units/ml   Plan:  Discontinue IV heparin  now and start Eliquis 10mg  bid x 7 days then on 7/28 reduce to 5mg  bid Copay check for Eliquis $104.36       11/20/2021,1:37 PM

## 2021-11-20 NOTE — Inpatient Diabetes Management (Signed)
Inpatient Diabetes Program Recommendations  AACE/ADA: New Consensus Statement on Inpatient Glycemic Control (2015)  Target Ranges:  Prepandial:   less than 140 mg/dL      Peak postprandial:   less than 180 mg/dL (1-2 hours)      Critically ill patients:  140 - 180 mg/dL   Lab Results  Component Value Date   GLUCAP 180 (H) 11/20/2021   HGBA1C 9.6 09/29/2021    Review of Glycemic Control  Latest Reference Range & Units 11/19/21 06:08 11/19/21 11:36 11/19/21 16:45 11/19/21 21:09 11/20/21 06:18 11/20/21 11:47  Glucose-Capillary 70 - 99 mg/dL 268 (H) 269 (H) 238 (H) 335 (H) 258 (H) 180 (H)   Diabetes history: DM type 1 Outpatient Diabetes medications: starting Omnipod after hospitalization, dexcom CGM Current orders for Inpatient glycemic control:    A1c 9.6% on 5/30   Spoke with pt at bedside. Pts Endocrinologist hoping to have better glycemic control on the insulin pump. Pt has had some training already. She was directed to start pump therapy when out of the hospital. Pt had Decadron 5 mg yesterday. Explained the effects of steroid therapy on glucose trends.   Glucose trends slowly coming down.  Thanks,  Tama Headings RN, MSN, BC-ADM Inpatient Diabetes Coordinator Team Pager [phone] (8a-5p)

## 2021-11-20 NOTE — Discharge Instructions (Signed)
Information on my medicine - ELIQUIS (apixaban)  This medication education was reviewed with me or my healthcare representative as part of my discharge preparation.    Why was Eliquis prescribed for you? Eliquis was prescribed to treat blood clots that may have been found in the veins of your legs (deep vein thrombosis) or in your lungs (pulmonary embolism) and to reduce the risk of them occurring again.  What do You need to know about Eliquis ? The starting dose is 10 mg (two 5 mg tablets) taken TWICE daily for the FIRST SEVEN (7) DAYS, then on  11/27/21 the dose is reduced to ONE 5 mg tablet taken TWICE daily.  Eliquis may be taken with or without food.   Try to take the dose about the same time in the morning and in the evening. If you have difficulty swallowing the tablet whole please discuss with your pharmacist how to take the medication safely.  Take Eliquis exactly as prescribed and DO NOT stop taking Eliquis without talking to the doctor who prescribed the medication.  Stopping may increase your risk of developing a new blood clot.  Refill your prescription before you run out.  After discharge, you should have regular check-up appointments with your healthcare provider that is prescribing your Eliquis.    What do you do if you miss a dose? If a dose of ELIQUIS is not taken at the scheduled time, take it as soon as possible on the same day and twice-daily administration should be resumed. The dose should not be doubled to make up for a missed dose.  Important Safety Information A possible side effect of Eliquis is bleeding. You should call your healthcare provider right away if you experience any of the following: Bleeding from an injury or your nose that does not stop. Unusual colored urine (red or dark brown) or unusual colored stools (red or black). Unusual bruising for unknown reasons. A serious fall or if you hit your head (even if there is no bleeding).  Some  medicines may interact with Eliquis and might increase your risk of bleeding or clotting while on Eliquis. To help avoid this, consult your healthcare provider or pharmacist prior to using any new prescription or non-prescription medications, including herbals, vitamins, non-steroidal anti-inflammatory drugs (NSAIDs) and supplements.  This website has more information on Eliquis (apixaban): http://www.eliquis.com/eliquis/home

## 2021-11-20 NOTE — Inpatient Diabetes Management (Signed)
Inpatient Diabetes Program Recommendations  AACE/ADA: New Consensus Statement on Inpatient Glycemic Control (2015)  Target Ranges:  Prepandial:   less than 140 mg/dL      Peak postprandial:   less than 180 mg/dL (1-2 hours)      Critically ill patients:  140 - 180 mg/dL   Lab Results  Component Value Date   GLUCAP 258 (H) 11/20/2021   HGBA1C 9.6 09/29/2021    Review of Glycemic Control  Latest Reference Range & Units 11/19/21 06:08 11/19/21 11:36 11/19/21 16:45 11/19/21 21:09 11/20/21 06:18  Glucose-Capillary 70 - 99 mg/dL 268 (H) 269 (H) 238 (H) 335 (H) 258 (H)   Diabetes history: DM1 (does not make any insulin; requires basal, correction, and carbohydrate insulin) Outpatient Diabetes medications: Semglee 34 units daily, Novolog 4-10 units TID with meals, Dexcom CGM, Recently prescribed OmniPod 5 insulin pump Current orders for Inpatient glycemic control: Semglee 34 units at HS, Novolog MODERATE correction scale TID & HS scale  Inpatient Diabetes Program Recommendations:   Noted that patient's blood sugars have been greater than 180 mg/dl.  -   Increase Semglee to 40 units -   Increase Novolog meal coverage to 5 units tid.   Tama Headings RN, MSN, BC-ADM Inpatient Diabetes Coordinator Team Pager [phone] (8a-5p)

## 2021-11-21 DIAGNOSIS — E782 Mixed hyperlipidemia: Secondary | ICD-10-CM | POA: Diagnosis not present

## 2021-11-21 DIAGNOSIS — I10 Essential (primary) hypertension: Secondary | ICD-10-CM | POA: Diagnosis not present

## 2021-11-21 DIAGNOSIS — I70228 Atherosclerosis of native arteries of extremities with rest pain, other extremity: Secondary | ICD-10-CM | POA: Diagnosis not present

## 2021-11-21 DIAGNOSIS — E039 Hypothyroidism, unspecified: Secondary | ICD-10-CM | POA: Diagnosis not present

## 2021-11-21 LAB — CBC
HCT: 40.1 % (ref 36.0–46.0)
Hemoglobin: 13.1 g/dL (ref 12.0–15.0)
MCH: 29.6 pg (ref 26.0–34.0)
MCHC: 32.7 g/dL (ref 30.0–36.0)
MCV: 90.5 fL (ref 80.0–100.0)
Platelets: 302 10*3/uL (ref 150–400)
RBC: 4.43 MIL/uL (ref 3.87–5.11)
RDW: 12.8 % (ref 11.5–15.5)
WBC: 11.7 10*3/uL — ABNORMAL HIGH (ref 4.0–10.5)
nRBC: 0 % (ref 0.0–0.2)

## 2021-11-21 LAB — GLUCOSE, CAPILLARY
Glucose-Capillary: 226 mg/dL — ABNORMAL HIGH (ref 70–99)
Glucose-Capillary: 194 mg/dL — ABNORMAL HIGH (ref 70–99)

## 2021-11-21 LAB — HEPARIN LEVEL (UNFRACTIONATED): Heparin Unfractionated: >1.1 IU/mL — ABNORMAL HIGH (ref 0.30–0.70)

## 2021-11-21 MED ORDER — OXYCODONE HCL 5 MG PO TABS: 5.0000 mg | ORAL_TABLET | Freq: Four times a day (QID) | ORAL | 0 refills | Status: DC | PRN

## 2021-11-21 NOTE — Discharge Summary (Signed)
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 *  Hospital Course: 41 year old female with medical history of CAD, chronic systolic heart failure, diabetes mellitus, CVA, stage III CKD presented with left arm pain.  Patient says that she was diabetic since age 33 she was scheduled for Zoom meeting to get approved for insulin pump.  Last hemoglobin A1c was 9.6.  She quit smoking 2 weeks ago.  She has chronic left foot wound with poor healing post amputation and is followed by podiatry.  She was due to have sutures removed today.  Patient says that she woke up at 2 AM with severe acute right forearm and hand pain.  It was cool to touch and has husband called EMS.   CTA of right upper extremity showed brachial artery embolus, vascular surgery was consulted and patient was taken to the OR for thrombectomy.    Assessment and Plan:  S/p right brachial/radial/ulnar artery thrombectomy -Presented with thrombus in right upper extremity vessels -CTA of upper extremity showed brachial artery embolus -Underwent successful thrombectomy including the brachial, radial and ulnar arteries -Heparin was transitioned to Eliquis -Patient will  be discharged on Eliquis -Patient is already on Plavix at home, will continue with Plavix and Eliquis    Cardioembolic work-up -Concern for cardio embolic etiology -Transthoracic echocardiogram is negative for thrombus -Patient will follow-up with vascular surgery as outpatient for further imaging studies as needed   Left toe amputation -Patient had recent left amputation with delayed healing -Podiatry was consulted by admitting provider and he said sutures can wait until next week -Continue Augmentin   Diabetes mellitus type 1 -Continue home regimen   CKD stage IIIb -Creatinine 2.33; at baseline --She will need nephrology consultation as outpatient   Hyperlipidemia -Continue Lipitor, fenofibrate   Hypertension -Continue hydralazine, Toprol-XL   Hypothyroidism -Continue Synthroid   Tobacco dependence -Currently not smoking         Consultants: Vascular surgery Procedures performed: Brachial thrombectomy Disposition: Home Diet recommendation:  Discharge Diet Orders (From admission, onward)     Start     Ordered   11/21/21 0000  Diet - low sodium heart healthy        11/21/21 1042           Cardiac diet DISCHARGE MEDICATION: Allergies as of 11/21/2021       Reactions   Wellbutrin [bupropion] Hives   Cefepime Rash   Tolerates penicilllin   Ciprofloxacin Hcl Hives, Rash   Hives/rash at injection site    Tape Rash        Medication List     STOP taking these medications    ibuprofen 200 MG tablet Commonly known as: ADVIL  TAKE these medications    amoxicillin-clavulanate 875-125 MG tablet Commonly known as: AUGMENTIN Take 1 tablet by mouth 2 (two) times daily.   atorvastatin 40 MG tablet Commonly known as: LIPITOR TAKE 1 TABLET BY MOUTH ONCE DAILY. What changed:  how much to take how to take this when to take this   clopidogrel 75 MG tablet Commonly known as: PLAVIX Take 1 tablet (75 mg total) by mouth daily.   Dexcom G6  Sensor Misc Change sensor every 10 days as directed   Dexcom G6 Transmitter Misc Change transmitter every 90 days as directed.   Eliquis DVT/PE Starter Pack Generic drug: Apixaban Starter Pack (50m and 524m Take as directed on package: start with two-69m66mablets twice daily for 7 days. On day 8, switch to one-69mg64mblet twice daily. (Take two tabs (10mg17mice daily through 7/27, then on 7/28 reduce to 1 tab (69mg) 19mce daily.)   fenofibrate 160 MG tablet TAKE 1 TABLET BY MOUTH ONCE DAILY.   furosemide 40 MG tablet Commonly known as: LASIX Take 1 tablet (40 mg total) by mouth daily. Take daily or as directed What changed:  when to take this reasons to take this additional instructions   hydrALAZINE 25 MG tablet Commonly known as: APRESOLINE Take 1 & 1/2 tablets (37.5 mg total) by mouth 3 (three) times daily. What changed: how much to take   insulin glargine-yfgn 100 UNIT/ML injection Commonly known as: SEMGLEE Inject 34 Units into the skin daily.   Insulin Pen Needle 31G X 5 MM Misc Use to inject novolog and semglee as directed   isosorbide mononitrate 30 MG 24 hr tablet Commonly known as: IMDUR Take 1 tablet (30 mg total) by mouth daily.   levothyroxine 175 MCG tablet Commonly known as: SYNTHROID Take 175 mcg by mouth daily before breakfast. What changed: Another medication with the same name was removed. Continue taking this medication, and follow the directions you see here.   metoprolol succinate 25 MG 24 hr tablet Commonly known as: TOPROL-XL Take 1 tablet (25 mg total) by mouth daily.   nitroGLYCERIN 0.4 MG SL tablet Commonly known as: NITROSTAT Place 1 tablet (0.4 mg total) under the tongue every 5 (five) minutes x 3 doses as needed for chest pain.   NovoLOG FlexPen 100 UNIT/ML FlexPen Generic drug: insulin aspart Inject 4-10 Units into the skin 3 (three) times daily with meals.   Omnipod 5 G6 Intro (Gen 5) Kit Change pod every 48-72 hrs   Omnipod 5 G6  Pod (Gen 5) Misc Change pod every 48-72 hours   oxyCODONE 5 MG immediate release tablet Commonly known as: Oxy IR/ROXICODONE Take 1 tablet (5 mg total) by mouth every 6 (six) hours as needed for moderate pain.   pantoprazole 40 MG tablet Commonly known as: PROTONIX Take 1 tablet (40 mg total) by mouth daily as needed for acid reflux What changed: reasons to take this   potassium chloride SA 20 MEQ tablet Commonly known as: KLOR-CON M Take 1 tablet (20 mEq total) by mouth daily        Follow-up Information     Clark,Marty Heckollow up in 1 month(s).   Specialty: Vascular Surgery Why: The office will call the patient with an appointment Contact information: [address] [phone]          Discharge Exam: Filed Weights   11/18/21 0453 11/18/21 1342 11/19/21 0356  Weight: 90.7 kg 90.4 kg  90.4 kg   General-appears in no acute distress Heart-S1-S2, regular, no murmur auscultated Lungs-clear to auscultation bilaterally, no wheezing or crackles auscultated Abdomen-soft, nontender, no organomegaly Extremities-no edema in the lower extremities Neuro-alert, oriented x3, no focal deficit noted  Condition at discharge: good  The results of significant diagnostics from this hospitalization (including imaging, microbiology, ancillary and laboratory) are listed below for reference.   Imaging Studies: ECHOCARDIOGRAM COMPLETE  Result Date: 11/19/2021    ECHOCARDIOGRAM REPORT   Patient Name:   MEGHANNE PLETZ Date of Exam: 11/19/2021 Medical Rec #:  [id number]       Height:       65.0 in Accession #:    [id number]      Weight:       199.3 lb Date of Birth:  [birth year]       BSA:          1.975 m Patient Age:    41 years        BP:           144/55 mmHg Patient Gender: F               HR:           74 bpm. Exam Location:  Inpatient Procedure: 2D Echo, Cardiac Doppler, Color Doppler and Intracardiac            Opacification Agent Indications:     Other abn of the heart  History:        Patient has prior history of Echocardiogram examinations, most                 recent 05/26/2020. Chronic systolic congestive heart failure,                 CAD; Risk Factors:Former Smoker and Diabetes.  Sonographer:    Joette Catching RCS Referring Phys: [place]  1. Left ventricular ejection fraction, by estimation, is 40 to 45%. The left ventricle has mildly decreased function. The left ventricle demonstrates regional wall motion abnormalities (see scoring diagram/findings for description). There is moderate concentric left ventricular hypertrophy. Left ventricular diastolic parameters are consistent with Grade II diastolic dysfunction (pseudonormalization). Elevated left ventricular end-diastolic pressure.  2. Right ventricular systolic function is normal. The right ventricular size is normal. There is normal pulmonary artery systolic pressure.  3. Left atrial size was mildly dilated.  4. The mitral valve is normal in structure. Trivial mitral valve regurgitation. No evidence of mitral stenosis.  5. The aortic valve is tricuspid. Aortic valve regurgitation is not visualized. No aortic stenosis is present.  6. The inferior vena cava is normal in size with greater than 50% respiratory variability, suggesting right atrial pressure of 3 mmHg. FINDINGS  Left Ventricle: Left ventricular ejection fraction, by estimation, is 40 to 45%. The left ventricle has mildly decreased function. The left ventricle demonstrates regional wall motion abnormalities. Definity contrast agent was given IV to delineate the left ventricular endocardial borders. The left ventricular internal cavity size was normal in size. There is moderate concentric left ventricular hypertrophy. Left ventricular diastolic parameters are consistent with Grade II diastolic dysfunction (pseudonormalization). Elevated left ventricular end-diastolic pressure.  LV Wall Scoring: The apical septal  segment and apex are akinetic. The entire inferior wall, mid anteroseptal segment, mid inferolateral segment, mid inferoseptal segment, apical anterior segment, and basal inferoseptal segment are hypokinetic. The anterior wall, antero-lateral wall, basal anteroseptal segment, basal inferolateral segment, and apical lateral segment are normal. Right Ventricle: The right  ventricular size is normal. No increase in right ventricular wall thickness. Right ventricular systolic function is normal. There is normal pulmonary artery systolic pressure. The tricuspid regurgitant velocity is 1.30 m/s, and  with an assumed right atrial pressure of 3 mmHg, the estimated right ventricular systolic pressure is 9.8 mmHg. Left Atrium: Left atrial size was mildly dilated. Right Atrium: Right atrial size was normal in size. Pericardium: Trivial pericardial effusion is present. The pericardial effusion is circumferential. Mitral Valve: The mitral valve is normal in structure. Trivial mitral valve regurgitation. No evidence of mitral valve stenosis. Tricuspid Valve: The tricuspid valve is normal in structure. Tricuspid valve regurgitation is trivial. No evidence of tricuspid stenosis. Aortic Valve: The aortic valve is tricuspid. Aortic valve regurgitation is not visualized. No aortic stenosis is present. Aortic valve mean gradient measures 4.0 mmHg. Aortic valve peak gradient measures 7.5 mmHg. Aortic valve area, by VTI measures 2.94 cm. Pulmonic Valve: The pulmonic valve was normal in structure. Pulmonic valve regurgitation is trivial. No evidence of pulmonic stenosis. Aorta: The aortic root is normal in size and structure. Venous: The inferior vena cava is normal in size with greater than 50% respiratory variability, suggesting right atrial pressure of 3 mmHg. IAS/Shunts: No atrial level shunt detected by color flow Doppler.  LEFT VENTRICLE PLAX 2D LVIDd:         4.75 cm      Diastology LVIDs:         3.40 cm      LV e' medial:    5.29  cm/s LV PW:         1.35 cm      LV E/e' medial:  18.7 LV IVS:        1.35 cm      LV e' lateral:   4.68 cm/s LVOT diam:     2.10 cm      LV E/e' lateral: 21.2 LV SV:         82 LV SV Index:   42 LVOT Area:     3.46 cm  LV Volumes (MOD) LV vol d, MOD A2C: 135.0 ml LV vol d, MOD A4C: 182.0 ml LV vol s, MOD A2C: 82.9 ml LV vol s, MOD A4C: 108.0 ml LV SV MOD A2C:     52.1 ml LV SV MOD A4C:     182.0 ml LV SV MOD BP:      67.1 ml RIGHT VENTRICLE RV Basal diam:  2.90 cm RV Mid diam:    1.50 cm RV S prime:     14.40 cm/s TAPSE (M-mode): 2.4 cm LEFT ATRIUM             Index        RIGHT ATRIUM           Index LA diam:        3.40 cm 1.72 cm/m   RA Area:     15.30 cm LA Vol (A2C):   55.9 ml 28.30 ml/m  RA Volume:   39.50 ml  20.00 ml/m LA Vol (A4C):   55.4 ml 28.05 ml/m LA Biplane Vol: 58.1 ml 29.42 ml/m  AORTIC VALVE                    PULMONIC VALVE AV Area (Vmax):    2.73 cm     PV Vmax:          1.30 m/s AV Area (Vmean):   2.86 cm     PV Peak grad:  6.8 mmHg AV Area (VTI):     2.94 cm     PR End Diast Vel: 8.07 msec AV Vmax:           137.00 cm/s AV Vmean:          99.900 cm/s AV VTI:            0.280 m AV Peak Grad:      7.5 mmHg AV Mean Grad:      4.0 mmHg LVOT Vmax:         108.00 cm/s LVOT Vmean:        82.400 cm/s LVOT VTI:          0.238 m LVOT/AV VTI ratio: 0.85  AORTA Ao Root diam: 3.10 cm Ao Asc diam:  3.10 cm MITRAL VALVE               TRICUSPID VALVE MV Area (PHT): 6.12 cm    TR Peak grad:   6.8 mmHg MV Decel Time: 124 msec    TR Vmax:        130.00 cm/s MV E velocity: 99.00 cm/s MV A velocity: 74.60 cm/s  SHUNTS MV E/A ratio:  1.33        Systemic VTI:  0.24 m                            Systemic Diam: 2.10 cm Skeet Latch MD Electronically signed by Skeet Latch MD Signature Date/Time: 11/19/2021/5:13:12 PM    Final    CT ANGIO UP EXTREM RIGHT W &/OR WO CONTRAST  Result Date: 11/18/2021 CLINICAL DATA:  Acute ischemia right upper extremity EXAM: CT ANGIOGRAPHY OF THE RIGHT  UPPEREXTREMITY TECHNIQUE: Multidetector CT imaging of the RIGHT UPPER EXTREMITYwas performed using the standard protocol during bolus administration of intravenous contrast. Multiplanar CT image reconstructions and MIPs were obtained to evaluate the vascular anatomy. RADIATION DOSE REDUCTION: This exam was performed according to the departmental dose-optimization program which includes automated exposure control, adjustment of the mA and/or kV according to patient size and/or use of iterative reconstruction technique. CONTRAST:  115m OMNIPAQUE IOHEXOL 350 MG/ML SOLN COMPARISON:  None Available. FINDINGS: Right upper extremity: Included central chest demonstrates patency of the visualized aortic arch and patent 3 vessel arch anatomy. The brachiocephalic, visualized right common carotid, right vertebral, right subclavian, and axillary arteries are all patent. Upper arm brachial artery is patent proximally but then has lack of opacification more peripherally in the lower humerus to antecubital region compatible with right brachial thromboembolic occlusion appearing to extend into the radial and ulnar arteries at the elbow. Radial and ulnar arteries are non-opacified into the forearm and wrist to confirm patency. Nonvascular: Included right chest demonstrates no other acute process. No large pericardial effusion or right pleural effusion. No large focal hepatic abnormality. Remote cholecystectomy. Prominent biliary tree without obstruction pattern. Cortical atrophy of the right kidney noted. Aortic atherosclerosis present. Mesenteric and visualized right renal vasculature appear patent. Included right abdomen demonstrates no additional acute process. Normal appendix. IUD noted within the uterine endometrial cavity. Right ovarian 3.3 cm small cyst noted. No pelvic free fluid. No visualized acute osseous finding. Review of the MIP images confirms the above findings. IMPRESSION: Acute arterial occlusion of the peripheral  brachial artery just above the elbow which appears to extend into the radius and ulnar arteries compatible with thromboembolic event. (Patient has already had right upper extremity surgical thrombectomy performed) Abdominal aortic atherosclerosis No other acute  nonvascular finding as detailed above. 3.3 cm right ovarian cyst No follow-up imaging recommended. Note: This recommendation does not apply to premenarchal patients and to those with increased risk (genetic, family history, elevated tumor markers or other high-risk factors) of ovarian cancer. Reference: JACR 2020 Feb; 17(2):248-254 These results were called by telephone at the time of interpretation on 11/18/2021 at 1:14 pm to provider Dr. Karmen Bongo, who verbally acknowledged these results. Electronically Signed   By: Jerilynn Mages.  Shick M.D.   On: 11/18/2021 13:16    Microbiology: Results for orders placed or performed in visit on 10/29/21  WOUND CULTURE     Status: None   Collection Time: 10/29/21  8:12 AM   Specimen: Foot, Left; Wound  Result Value Ref Range Status   MICRO NUMBER: [id number]  Final   SPECIMEN QUALITY: Adequate  Final   SOURCE: NOT GIVEN  Final   STATUS: FINAL  Final   GRAM STAIN:   Final    No organisms or white blood cells seen No epithelial cells seen   RESULT:   Final    Growth of skin flora (note: Growth does not include S. aureus, beta-hemolytic Streptococci or P. aeruginosa).   COMMENT:   Final    No source was provided. The specimen was tested and reported based upon the test code ordered. If this is incorrect, please contact client services.    Labs: CBC: Recent Labs  Lab 11/18/21 0555 11/18/21 0601 11/19/21 1037 11/20/21 0159 11/21/21 0227  WBC 16.2*  --  21.5* 14.7* 11.7*  NEUTROABS 12.7*  --   --   --   --   HGB 15.7* 16.0* 12.3 13.2 13.1  HCT 45.4 47.0* 36.3 38.6 40.1  MCV 87.3  --  88.5 88.3 90.5  PLT 380  --  287 299 756   Basic Metabolic Panel: Recent Labs  Lab 11/18/21 0555 11/18/21 0601  11/19/21 0149 11/20/21 0159  NA 132* 134* 134* 138  K 4.2 4.4 4.6 4.0  CL 103 105 108 108  CO2 18*  --  19* 21*  GLUCOSE 493* 490* 318* 208*  BUN 31* 33* 38* 35*  CREATININE 2.16* 2.20* 2.33* 2.22*  CALCIUM 9.0  --  8.0* 8.1*   Liver Function Tests: Recent Labs  Lab 11/18/21 0555  AST 14*  ALT 15  ALKPHOS 105  BILITOT 1.0  PROT 7.3  ALBUMIN 3.1*   CBG: Recent Labs  Lab 11/20/21 1147 11/20/21 1734 11/20/21 2110 11/21/21 0605 11/21/21 1038  GLUCAP 180* 250* 180* 226* 194*    Discharge time spent: greater than 30 minutes.  Signed: Oswald Hillock, MD [facility] 11/21/2021

## 2021-11-21 NOTE — Progress Notes (Addendum)
  Progress Note    11/21/2021 7:42 AM 3 Days Post-Op  Subjective:  still some right arm incisional soreness. Still with some thumb numbness    Vitals:   11/21/21 0017 11/21/21 0411  BP: (!) 90/38 127/81  Pulse: 87 85  Resp: 16 16  Temp: 98.2 F (36.8 C) 98 F (36.7 C)  SpO2: 99% 100%   Physical Exam: Cardiac:  regular Lungs:  non labored Incisions:  right AC incision is clean, dry and intact without hematoma. Mild swelling present Extremities:  2+ radial pulse, right hand warm. 5/5 grip strength. Motor and sensation intact Neurologic: alert and oriented  CBC    Component Value Date/Time   WBC 11.7 (H) 11/21/2021 0227   RBC 4.43 11/21/2021 0227   HGB 13.1 11/21/2021 0227   HCT 40.1 11/21/2021 0227   PLT 302 11/21/2021 0227   MCV 90.5 11/21/2021 0227   MCH 29.6 11/21/2021 0227   MCHC 32.7 11/21/2021 0227   RDW 12.8 11/21/2021 0227   LYMPHSABS 2.5 11/18/2021 0555   MONOABS 0.7 11/18/2021 0555   EOSABS 0.2 11/18/2021 0555   BASOSABS 0.1 11/18/2021 0555    BMET    Component Value Date/Time   NA 138 11/20/2021 0159   NA 135 11/17/2015 1153   K 4.0 11/20/2021 0159   CL 108 11/20/2021 0159   CO2 21 (L) 11/20/2021 0159   GLUCOSE 208 (H) 11/20/2021 0159   BUN 35 (H) 11/20/2021 0159   BUN 29 (H) 11/17/2015 1153   CREATININE 2.22 (H) 11/20/2021 0159   CALCIUM 8.1 (L) 11/20/2021 0159   GFRNONAA 28 (L) 11/20/2021 0159   GFRAA 31 (L) 02/01/2020 1328    INR    Component Value Date/Time   INR 1.1 08/28/2021 0001     Intake/Output Summary (Last 24 hours) at 11/21/2021 9381 Last data filed at 11/20/2021 2000 Gross per 24 hour  Intake 360 ml  Output --  Net 360 ml     Assessment/Plan:  41 y.o. female is s/p Right brachial, radial, and ulnar artery thrombectomy     3 Days Post-Op   Right arm remains well perfused and warm with palpable 2+ radial pulse Right arm incision is clean dry and intact Cleared by OT Continue statin, Aspirin and  Eliquis Cardioembolic workup negative so far Will arrange follow up in 1 month with right upper extremity arterial duplex  DVT prophylaxis:  Eliquis   Corrina Arvilla Market, PA-C [facility] [phone] 11/21/2021 7:42 AM  VASCULAR STAFF ADDENDUM: I have independently interviewed and examined the patient. I agree with the above.  Doing well. CT chest can be performed in the outpt setting.  Vascular will sign off at this time.   Cassandria Santee, MD [facility] Phone Number: [phone] 11/21/2021 11:12 AM

## 2021-11-22 LAB — LUPUS ANTICOAGULANT PANEL
DRVVT: 44.3 s (ref 0.0–47.0)
PTT Lupus Anticoagulant: 42.2 s (ref 0.0–43.5)

## 2021-11-22 LAB — PROTEIN C ACTIVITY: Protein C Activity: 124 % (ref 73–180)

## 2021-11-22 LAB — PROTEIN S ACTIVITY: Protein S Activity: 91 % (ref 63–140)

## 2021-11-22 LAB — PROTEIN S, TOTAL: Protein S Ag, Total: 126 % (ref 60–150)

## 2021-11-23 ENCOUNTER — Other Ambulatory Visit (HOSPITAL_COMMUNITY): Payer: Self-pay

## 2021-11-24 ENCOUNTER — Ambulatory Visit: Payer: Self-pay | Admitting: *Deleted

## 2021-11-24 ENCOUNTER — Telehealth: Payer: Self-pay | Admitting: Cardiology

## 2021-11-24 ENCOUNTER — Telehealth: Payer: Self-pay

## 2021-11-24 NOTE — Telephone Encounter (Signed)
Pt.notified

## 2021-11-24 NOTE — Telephone Encounter (Signed)
Would be up to her vascular doctors who had started that regimen.    Zandra Abts MD

## 2021-11-24 NOTE — Patient Outreach (Signed)
  Care Coordination TOC Note Transition Care Management Follow-up Telephone Call Date of discharge and from where: 11/21/21 [place] for critical ischemia of upper right extremity CTA of right upper extremity showed brachial artery embolus, vascular surgery was consulted and patient was taken to the OR for thrombectomy on 11/18/21 How have you been since you were released from the hospital? [place] noticed visual concerns, blurred vision, some clear odorless drainage Any questions or concerns? No   Items Reviewed: Did the pt receive and understand the discharge instructions provided? Yes  Medications obtained and verified? Yes  She was given an Eliquis starter pack prior to discharge She has to complete the online application for the Eliquis discount Encouraged to call if have later concerns Other? No  Any new allergies since your discharge? No  Dietary orders reviewed? Yes Do you have support at home? Yes   Home Care and Equipment/Supplies: Were home health services ordered? no If so, what is the name of the agency? na  Has the agency set up a time to come to the patient's home? not applicable Were any new equipment or medical supplies ordered?  No What is the name of the medical supply agency? na Were you able to get the supplies/equipment? not applicable Do you have any questions related to the use of the equipment or supplies? No  Functional Questionnaire: (I = Independent and D = Dependent) ADLs: I  Bathing/Dressing- I  Meal Prep- I  Eating- I  Maintaining continence- I  Transferring/Ambulation- I  Managing Meds- I  Follow up appointments reviewed:  PCP Hospital f/u appt confirmed?  Has not made pcp fu appointment yet  . [facility] f/u appt confirmed? Yes  Scheduled to see Dr Sherryle Lis on 11/30/21 @ 0845. Are transportation arrangements needed? No  If their condition worsens, is the pt aware to call PCP or go to the Emergency Dept.? Yes Was the patient provided  with contact information for the PCP's office or ED? Yes Was to pt encouraged to call back with questions or concerns? Yes  SDOH assessments and interventions completed:   Yes  Care Coordination Interventions Activated:  No Care Coordination Interventions:   pcp follow up encouraged   Encounter Outcome:  Pt. Visit Completed  Rheya Minogue L. Lavina Hamman, RN, BSN, [place] Coordinator Office number [phone] Main [facility] number [phone] Fax number [phone]

## 2021-11-24 NOTE — Telephone Encounter (Signed)
Patient wants to know if she can have a quicker appt. I told patient that the losest I see will be in December which she already has. Please call

## 2021-11-24 NOTE — Telephone Encounter (Signed)
Pt states that she was told by cardiology to take Plavix and Eliquis. And was told by hospital doctor to take ASA and Eliquis. Pt states she is unsure what she is to take. She would like Dr. Harl Bowie to review and advise.

## 2021-11-24 NOTE — Telephone Encounter (Signed)
Pt called stating that she is having some leaking from her wound site.  Reviewed pt's chart, returned pt's call for clarification, two identifiers used. Pt stated that the drainage started approx 30 min ago and was clear with no odor. She denies any fever or red streaks, but does have some soreness and swelling, which has not changed since surgery. Instructed pt to wash with warm water and mild soap, keep covered with dry gauze, change frequently if soiled, monitor for any S&S of infection, and elevate arm. Spoke with Sam PA for further advice. Pt to call if worsening symptoms. Confirmed understanding.

## 2021-11-25 ENCOUNTER — Other Ambulatory Visit (HOSPITAL_COMMUNITY): Payer: Self-pay

## 2021-11-25 ENCOUNTER — Other Ambulatory Visit: Payer: Self-pay | Admitting: *Deleted

## 2021-11-25 DIAGNOSIS — I70228 Atherosclerosis of native arteries of extremities with rest pain, other extremity: Secondary | ICD-10-CM

## 2021-11-26 ENCOUNTER — Other Ambulatory Visit (HOSPITAL_COMMUNITY): Payer: Self-pay

## 2021-11-26 LAB — PROTHROMBIN GENE MUTATION

## 2021-11-27 LAB — FACTOR 5 LEIDEN

## 2021-11-30 ENCOUNTER — Encounter: Payer: Self-pay | Admitting: Podiatry

## 2021-11-30 ENCOUNTER — Ambulatory Visit (INDEPENDENT_AMBULATORY_CARE_PROVIDER_SITE_OTHER): Payer: No Typology Code available for payment source | Admitting: Podiatry

## 2021-11-30 DIAGNOSIS — T148XXD Other injury of unspecified body region, subsequent encounter: Secondary | ICD-10-CM

## 2021-11-30 DIAGNOSIS — L97422 Non-pressure chronic ulcer of left heel and midfoot with fat layer exposed: Secondary | ICD-10-CM

## 2021-11-30 DIAGNOSIS — E10621 Type 1 diabetes mellitus with foot ulcer: Secondary | ICD-10-CM

## 2021-11-30 NOTE — Progress Notes (Signed)
  Subjective:  Patient ID: Lauren [name], female    DOB: 1980/12/21,  MRN: [id number]  Chief Complaint  Patient presents with   Routine Post Op    POV #2 DOS 10/29/2021 WOUND EXCISION & CLOSURE LT FOOT- Patient stated 2 stitches came off the incision site. Patient stated she is having some pain, "I am having feeling to my foot."      41 y.o. female returns for post-op check.  She was hospitalized recently for thrombosis in the right upper extremity  Review of Systems: Negative except as noted in the HPI. Denies N/V/F/Ch.   Objective:  There were no vitals filed for this visit. There is no height or weight on file to calculate BMI. Constitutional Well developed. Well nourished.  Vascular Foot warm and well perfused. Capillary refill normal to all digits.  Calf is soft and supple, no posterior calf or knee pain, negative Homans' sign  Neurologic Normal speech. Oriented to person, place, and time. Epicritic sensation to light touch grossly present bilaterally.  Dermatologic Proximal portions of incision have healed, the distal portions again of had dehiscence with exposed subcutaneous tissue measures 3.5 x 0.5 x 1.0 cm.  No signs of infection no drainage or purulence  Orthopedic: Tenderness to palpation noted about the surgical site.      Assessment:   1. Delayed wound healing   2. 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  S/p foot surgery left -Unfortunately still remains quite difficult to heal.  All sutures removed today.  Nonviable tissue was debrided in excisional manner with a scalpel.  Postdebridement ulcer noted above.  This was carried down to the layer of the subcutaneous tissue.  We will switch to Iodosorb to see if this alleviates any of the bioburden and slows down drainage, may consider Santyl or Prisma again after the drainage  maceration has improved.  I will see her back in 2 weeks for follow-up.  Order for albumin and prealbumin were given, her prealbumin was quite low when she was hospitalized, suspect she has nutritional deficiencies that are exacerbating poor wound healing.  May consider referral to nutritionist   Return in about 2 weeks (around 12/14/2021) for wound care.

## 2021-12-01 ENCOUNTER — Ambulatory Visit (HOSPITAL_BASED_OUTPATIENT_CLINIC_OR_DEPARTMENT_OTHER): Payer: No Typology Code available for payment source | Admitting: Family

## 2021-12-01 LAB — PREALBUMIN: PREALBUMIN: 18 mg/dL (ref 14–35)

## 2021-12-01 LAB — ALBUMIN: Albumin: 3.2 g/dL — ABNORMAL LOW (ref 3.9–4.9)

## 2021-12-01 NOTE — Progress Notes (Signed)
Called and spoke with patient,has an upcoming appointment 08/17 with Nutritionist.

## 2021-12-01 NOTE — Addendum Note (Signed)
Addended bySherryle Lis, Ariaunna Longsworth R on: 12/01/2021 08:06 AM   Modules accepted: Orders

## 2021-12-09 ENCOUNTER — Ambulatory Visit: Payer: Self-pay | Admitting: *Deleted

## 2021-12-09 NOTE — Patient Outreach (Addendum)
  Care Coordination   Follow Up Visit Note   12/09/2021 Name: Lauren [name] MRN: [id number] DOB: 1980-10-10  Lauren [name] is a 41 y.o. year old female who sees Lauren Evens, MD for primary care. I spoke with  Lauren [name] by phone today  What matters to the patients health and wellness today?   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  Recently received insulin pump   Goals Addressed               This Visit's Progress     Patient Stated     Manage diabetes and poor wound healing & seek help for coverage & financial resources [facility]) (pt-stated)   Not on track     Care Coordination Interventions: Advised patient to keep in contact with [employer] and her job supervisor related to job, status of disability and insurance coverage Assisted patient with initiation or completion of advanced directives (see documentation in [place]) Social Work referral for financial resource/strain related potential loss of insurance, decrease of household income by half (only husband's) on 12/09/21 Discussed plans with patient for ongoing care management follow up and provided patient with direct contact information for care management team Provided education to patient about basic DM disease process Reviewed medications with patient and discussed importance of medication adherence Discussed plans with patient for ongoing care management follow up and provided patient with direct contact information for care management team Screening for signs and symptoms of depression related to  chronic disease state  Assessed social determinant of health barriers Sent online education on toe amputation and delayed wound healing         SDOH assessments and interventions completed:  Yes  SDOH Interventions Today    Flowsheet Row Most Recent Value  SDOH Interventions   Food Insecurity Interventions Intervention Not Indicated  Financial Strain Interventions Other (Comment)  [referred to [facility] SW encouraged keeping in conact with [employer] and her job supervisor]  Stress Interventions Other (Comment)  [referred to [place] stress related to pending loss of insurance coverage, job Husband only income provider]  Transportation Interventions Intervention Not Indicated        Care Coordination Interventions Activated:  Yes  Care Coordination Interventions:  Yes, provided   Follow up plan: Follow up call scheduled for 3-7 business days    Encounter Outcome:  Pt. Visit Completed   Lauren [name] L. Lavina Hamman, RN, BSN, [place] Coordinator Office number [phone]

## 2021-12-09 NOTE — Patient Instructions (Addendum)
Visit Information  Thank you for taking time to visit with me today. Please don't hesitate to contact me if I can be of assistance to you.   Following are the goals we discussed today:   Goals Addressed               This Visit's Progress     Patient Stated     Manage diabetes and poor wound healing & seek help for coverage & financial resources [facility]) (pt-stated)   Not on track     Care Coordination Interventions: Advised patient to keep in contact with [employer] and her job supervisor related to job, status of disability and insurance coverage Assisted patient with initiation or completion of advanced directives (see documentation in [place]) Social Work referral for financial resource/strain related potential loss of insurance, decrease of household income by half (only husband's) on 12/09/21 Discussed plans with patient for ongoing care management follow up and provided patient with direct contact information for care management team Provided education to patient about basic DM disease process Reviewed medications with patient and discussed importance of medication adherence Discussed plans with patient for ongoing care management follow up and provided patient with direct contact information for care management team Screening for signs and symptoms of depression related to chronic disease state  Assessed social determinant of health barriers Sent online education on toe amputation and delayed wound healing         Our next appointment is by telephone on 12/15/21 at 1430  Please call the care guide team at [phone] if you need to cancel or reschedule your appointment.   If you are experiencing a Mental Health or [place] or need someone to talk to, please call the Suicide and Crisis Lifeline: 988 call the [place] National Suicide Prevention Lifeline: 956-050-8455 or TTY: 636-493-4914 TTY (343)520-2669) to talk to a trained counselor call 1-800-273-TALK (toll free,  24 hour hotline) call the [facility]: [phone] call 911   Patient verbalizes understanding of instructions and care plan provided today and agrees to view in [place]. Active MyChart status and patient understanding of how to access instructions and care plan via MyChart confirmed with patient.     The patient has been provided with contact information for the care management team and has been advised to call with any health related questions or concerns.  The care management team will reach out to the patient again over the next 3-7 business days.   [place] Lavina Hamman, RN, BSN, [place] Coordinator Office number [phone]

## 2021-12-11 ENCOUNTER — Telehealth: Payer: No Typology Code available for payment source | Admitting: Physician Assistant

## 2021-12-11 ENCOUNTER — Telehealth: Payer: Self-pay | Admitting: *Deleted

## 2021-12-11 ENCOUNTER — Other Ambulatory Visit: Payer: Self-pay

## 2021-12-11 ENCOUNTER — Other Ambulatory Visit: Payer: Self-pay | Admitting: *Deleted

## 2021-12-11 ENCOUNTER — Encounter (HOSPITAL_COMMUNITY): Payer: Self-pay | Admitting: Emergency Medicine

## 2021-12-11 ENCOUNTER — Emergency Department (HOSPITAL_COMMUNITY)
Admission: EM | Admit: 2021-12-11 | Discharge: 2021-12-12 | Disposition: A | Discharge status: Home / Self Care | Payer: No Typology Code available for payment source | Attending: Emergency Medicine | Admitting: Emergency Medicine

## 2021-12-11 DIAGNOSIS — Z5321 Procedure and treatment not carried out due to patient leaving prior to being seen by health care provider: Secondary | ICD-10-CM | POA: Insufficient documentation

## 2021-12-11 DIAGNOSIS — L03116 Cellulitis of left lower limb: Secondary | ICD-10-CM

## 2021-12-11 DIAGNOSIS — Z7901 Long term (current) use of anticoagulants: Secondary | ICD-10-CM | POA: Insufficient documentation

## 2021-12-11 DIAGNOSIS — E1059 Type 1 diabetes mellitus with other circulatory complications: Secondary | ICD-10-CM

## 2021-12-11 DIAGNOSIS — I70228 Atherosclerosis of native arteries of extremities with rest pain, other extremity: Secondary | ICD-10-CM

## 2021-12-11 DIAGNOSIS — S98132A Complete traumatic amputation of one left lesser toe, initial encounter: Secondary | ICD-10-CM

## 2021-12-11 DIAGNOSIS — R29810 Facial weakness: Secondary | ICD-10-CM | POA: Insufficient documentation

## 2021-12-11 DIAGNOSIS — R531 Weakness: Secondary | ICD-10-CM | POA: Insufficient documentation

## 2021-12-11 DIAGNOSIS — M86172 Other acute osteomyelitis, left ankle and foot: Secondary | ICD-10-CM

## 2021-12-11 DIAGNOSIS — I639 Cerebral infarction, unspecified: Secondary | ICD-10-CM

## 2021-12-11 DIAGNOSIS — L233 Allergic contact dermatitis due to drugs in contact with skin: Secondary | ICD-10-CM

## 2021-12-11 DIAGNOSIS — R4781 Slurred speech: Secondary | ICD-10-CM | POA: Diagnosis not present

## 2021-12-11 MED ORDER — PREDNISONE 5 MG PO TABS: ORAL_TABLET | ORAL | 0 refills | Status: DC

## 2021-12-11 NOTE — Patient Outreach (Signed)
  Care Coordination   Care coordination  Visit Note   12/11/2021 Name: Lauren [name] MRN: [id number] DOB: 06-26-1980  Lauren [name] is a 41 y.o. year old female who sees Lemmie Evens, MD for primary care. I  collaborated with [place]. Referred to [facility] care guide  What matters to the patients health and wellness today?  Finances, insurance coverage changes pending end of short term disability with the uncertainty of access to long term disability and finances decreased with only household income from spouse Uncertain of Return to work on 12/19/21 Short term disability per patient ended on 12/01/21 & not approved for long term disability is a possibility per patient    Goals Addressed               This Visit's Progress     Patient Stated     Manage diabetes and poor wound healing & seek help for coverage & financial resources [facility]) (pt-stated)        Care Coordination Interventions: Advised patient to keep in contact with [employer] and her job supervisor related to job, status of disability and insurance coverage Assisted patient with initiation or completion of advanced directives (see documentation in [place]) Social Work referral for financial resource/strain related potential loss of insurance, decrease of household income by half (only husband's) on 12/09/21 Discussed plans with patient for ongoing care management follow up and provided patient with direct contact information for care management team Provided education to patient about basic DM disease process Reviewed medications with patient and discussed importance of medication adherence Discussed plans with patient for ongoing care management follow up and provided patient with direct contact information for care management team Screening for signs and symptoms of depression related to chronic disease state  Assessed social determinant of health barriers Sent online education on toe amputation and delayed wound healing  12/11/21  collaboration with [facility] SW, referred to [facility] care guides        SDOH assessments and interventions completed:  No     Care Coordination Interventions Activated:  Yes  Care Coordination Interventions:  Yes, provided   Follow up plan: Follow up call scheduled for 3-7 business days    Encounter Outcome:  Pt. Visit Completed   Cleve Paolillo L. Lavina Hamman, RN, BSN, [place] Coordinator Office number [phone]

## 2021-12-11 NOTE — ED Provider Triage Note (Cosign Needed Addendum)
Emergency Medicine Provider Triage Evaluation Note  Lauren [name] , a 41 y.o. female  was evaluated in triage.  Pt complains of concern for TIA.  Around 9:30PM had sudden onset of left-sided weakness, slurred speech, and left facial droop.  This lasted for about 20 minutes.  EMS was called but wanted to transport to [facility], so opted to have her daughter drive her here.  By time of arrival in the ED, symptoms have fully resolved and she feels back to baseline.  She did have prior ischemic stroke in August '22.  Recent surgery to right arm by Dr. Carlis Abbott for blood clots.  Currently on Eliquis.  Also concerned for blood loss anemia. Had IUD placed 8 days ago to help with menses.  Has had ongoing bleeding since that time, passage of clots.  Review of Systems  Positive: TIA Negative: Confusion, dizziness  Physical Exam  BP (!) 147/80   Pulse 100   Temp 98.1 F (36.7 C) (Oral)   Resp 16   SpO2 98%  Gen:   Awake, no distress   Resp:  Normal effort  MSK:   Moves extremities without difficulty  Other:  AAOx3, no focal weakness present in triage, speech is clear and goal oriented, no facial droop  Medical Decision Making  Medically screening exam initiated at 11:49 PM.  Appropriate orders placed.  Lauren [name] was informed that the remainder of the evaluation will be completed by another provider, this initial triage assessment does not replace that evaluation, and the importance of remaining in the ED until their evaluation is complete.  Symptoms concerning for TIA.  No focal deficits in triage.  EKG, labs for stroke panel, CT head.    Made acuity 2, will expedite room assignment.   Lauren Pickett, PA-C 12/11/21 2352    Lauren Pickett, PA-C 12/11/21 2356

## 2021-12-11 NOTE — ED Triage Notes (Addendum)
Patient reports brief episode of left facial asymmetry with slurred speech and hands numbness at 9:30 pm this evening , symptoms resolved at arrival . History of CVA .

## 2021-12-11 NOTE — Telephone Encounter (Signed)
Patient called and states she has a rash like area around her incision that itches. Patient just had a video visit with her provider who recommended Benadryl 25-50mg  every 4 hours as needed for itching and called in a prednisone taper patient asking if ok to take these for this. Advised patient to follow Providers instructions. Patient verbalized understanding.

## 2021-12-11 NOTE — Progress Notes (Signed)
E Visit for Rash  We are sorry that you are not feeling well. Here is how we plan to help!  Based on what you shared with me it looks like you have contact dermatitis.  Contact dermatitis is a skin rash caused by something that touches the skin and causes irritation or inflammation.  Your skin may be red, swollen, dry, cracked, and itch.  The rash should go away in a few days but can last a few weeks.  If you get a rash, it's important to figure out what caused it so the irritant can be avoided in the future.  I recommend you take Benadryl 25 mg - 50 mg every 4 hours to control the symptoms but if they last over 24 hours it is best that you see an office based provider for follow up.  Prednisone 5 mg daily for 6 days (see taper instructions below)  Directions for 6 day taper: Day 1: 2 tablets before breakfast, 1 after both lunch & dinner and 2 at bedtime Day 2: 1 tab before breakfast, 1 after both lunch & dinner and 2 at bedtime Day 3: 1 tab at each meal & 1 at bedtime Day 4: 1 tab at breakfast, 1 at lunch, 1 at bedtime Day 5: 1 tab at breakfast & 1 tab at bedtime Day 6: 1 tab at breakfast  Being that you have been prescribed a steroid for this reaction please monitor sugars very closely and adjust your insulin as needed.   HOME CARE:  Take cool showers and avoid direct sunlight. Apply cool compress or wet dressings. Take a bath in an oatmeal bath.  Sprinkle content of one Aveeno packet under running faucet with comfortably warm water.  Bathe for 15-20 minutes, 1-2 times daily.  Pat dry with a towel. Do not rub the rash. Use hydrocortisone cream. Take an antihistamine like Benadryl for widespread rashes that itch.  The adult dose of Benadryl is 25-50 mg by mouth 4 times daily. Caution:  This type of medication may cause sleepiness.  Do not drink alcohol, drive, or operate dangerous machinery while taking antihistamines.  Do not take these medications if you have prostate enlargement.  Read  package instructions thoroughly on all medications that you take.  GET HELP RIGHT AWAY IF:  Symptoms don't go away after treatment. Severe itching that persists. If you rash spreads or swells. If you rash begins to smell. If it blisters and opens or develops a yellow-brown crust. You develop a fever. You have a sore throat. You become short of breath.  MAKE SURE YOU:  Understand these instructions. Will watch your condition. Will get help right away if you are not doing well or get worse.  Thank you for choosing an e-visit.  Your e-visit answers were reviewed by a board certified advanced clinical practitioner to complete your personal care plan. Depending upon the condition, your plan could have included both over the counter or prescription medications.  Please review your pharmacy choice. Make sure the pharmacy is open so you can pick up prescription now. If there is a problem, you may contact your provider through [employer] and have the prescription routed to another pharmacy.  Your safety is important to [name]. If you have drug allergies check your prescription carefully.   For the next 24 hours you can use MyChart to ask questions about today's visit, request a non-urgent call back, or ask for a work or school excuse. You will get an email in the  next two days asking about your experience. I hope that your e-visit has been valuable and will speed your recovery.  I provided 5 minutes of non face-to-face time during this encounter for chart review and documentation.

## 2021-12-12 ENCOUNTER — Other Ambulatory Visit (HOSPITAL_COMMUNITY): Payer: No Typology Code available for payment source

## 2021-12-12 ENCOUNTER — Emergency Department (HOSPITAL_COMMUNITY): Payer: No Typology Code available for payment source

## 2021-12-12 LAB — CBC
HCT: 28.8 % — ABNORMAL LOW (ref 36.0–46.0)
Hemoglobin: 9.6 g/dL — ABNORMAL LOW (ref 12.0–15.0)
MCH: 30.3 pg (ref 26.0–34.0)
MCHC: 33.3 g/dL (ref 30.0–36.0)
MCV: 90.9 fL (ref 80.0–100.0)
Platelets: 319 10*3/uL (ref 150–400)
RBC: 3.17 MIL/uL — ABNORMAL LOW (ref 3.87–5.11)
RDW: 13.1 % (ref 11.5–15.5)
WBC: 10.2 10*3/uL (ref 4.0–10.5)
nRBC: 0 % (ref 0.0–0.2)

## 2021-12-12 LAB — URINALYSIS, ROUTINE W REFLEX MICROSCOPIC
Bilirubin Urine: NEGATIVE
Glucose, UA: 150 mg/dL — AB
Ketones, ur: NEGATIVE mg/dL
Nitrite: NEGATIVE
Protein, ur: >=300 mg/dL — AB
Specific Gravity, Urine: 1.012 (ref 1.005–1.030)
pH: 7 (ref 5.0–8.0)

## 2021-12-12 LAB — COMPREHENSIVE METABOLIC PANEL
ALT: 19 U/L (ref 0–44)
AST: 16 U/L (ref 15–41)
Albumin: 2.7 g/dL — ABNORMAL LOW (ref 3.5–5.0)
Alkaline Phosphatase: 80 U/L (ref 38–126)
Anion gap: 7 (ref 5–15)
BUN: 23 mg/dL — ABNORMAL HIGH (ref 6–20)
CO2: 23 mmol/L (ref 22–32)
Calcium: 8.4 mg/dL — ABNORMAL LOW (ref 8.9–10.3)
Chloride: 105 mmol/L (ref 98–111)
Creatinine, Ser: 2.03 mg/dL — ABNORMAL HIGH (ref 0.44–1.00)
GFR, Estimated: 31 mL/min — ABNORMAL LOW (ref >60)
Glucose, Bld: 227 mg/dL — ABNORMAL HIGH (ref 70–99)
Potassium: 3.8 mmol/L (ref 3.5–5.1)
Sodium: 135 mmol/L (ref 135–145)
Total Bilirubin: 0.3 mg/dL (ref 0.3–1.2)
Total Protein: 5.6 g/dL — ABNORMAL LOW (ref 6.5–8.1)

## 2021-12-12 LAB — RAPID URINE DRUG SCREEN, HOSP PERFORMED
Amphetamines: NOT DETECTED
Barbiturates: NOT DETECTED
Benzodiazepines: NOT DETECTED
Cocaine: NOT DETECTED
Opiates: NOT DETECTED
Tetrahydrocannabinol: NOT DETECTED

## 2021-12-12 LAB — DIFFERENTIAL
Abs Immature Granulocytes: 0.04 10*3/uL (ref 0.00–0.07)
Basophils Absolute: 0.1 10*3/uL (ref 0.0–0.1)
Basophils Relative: 1 %
Eosinophils Absolute: 0.3 10*3/uL (ref 0.0–0.5)
Eosinophils Relative: 3 %
Immature Granulocytes: 0 %
Lymphocytes Relative: 30 %
Lymphs Abs: 3 10*3/uL (ref 0.7–4.0)
Monocytes Absolute: 0.5 10*3/uL (ref 0.1–1.0)
Monocytes Relative: 5 %
Neutro Abs: 6.3 10*3/uL (ref 1.7–7.7)
Neutrophils Relative %: 61 %

## 2021-12-12 LAB — APTT: aPTT: 30 seconds (ref 24–36)

## 2021-12-12 LAB — PROTIME-INR
INR: 1 (ref 0.8–1.2)
Prothrombin Time: 13.4 seconds (ref 11.4–15.2)

## 2021-12-12 LAB — ETHANOL: Alcohol, Ethyl (B): <10 mg/dL (ref <10)

## 2021-12-12 LAB — I-STAT BETA HCG BLOOD, ED (MC, WL, AP ONLY): I-stat hCG, quantitative: <5 m[IU]/mL (ref <5)

## 2021-12-12 NOTE — ED Notes (Signed)
PT DECIDED TO LEAVE

## 2021-12-14 ENCOUNTER — Encounter: Payer: Self-pay | Admitting: Podiatry

## 2021-12-14 ENCOUNTER — Ambulatory Visit (INDEPENDENT_AMBULATORY_CARE_PROVIDER_SITE_OTHER): Payer: No Typology Code available for payment source | Admitting: Podiatry

## 2021-12-14 ENCOUNTER — Ambulatory Visit (INDEPENDENT_AMBULATORY_CARE_PROVIDER_SITE_OTHER): Payer: No Typology Code available for payment source

## 2021-12-14 DIAGNOSIS — Z9889 Other specified postprocedural states: Secondary | ICD-10-CM | POA: Diagnosis not present

## 2021-12-14 DIAGNOSIS — L97425 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 without evidence of necrosis: Secondary | ICD-10-CM | POA: Diagnosis not present

## 2021-12-14 DIAGNOSIS — E10621 Type 1 diabetes mellitus with foot ulcer: Secondary | ICD-10-CM

## 2021-12-14 MED ORDER — AMOXICILLIN-POT CLAVULANATE 875-125 MG PO TABS: 1.0000 | ORAL_TABLET | Freq: Two times a day (BID) | ORAL | 0 refills | Status: DC

## 2021-12-14 NOTE — Progress Notes (Signed)
  Subjective:  Patient ID: Lauren [name], female    DOB: [birth year],  MRN: [id number]  Chief Complaint  Patient presents with   Wound Check    WOUND EXCISION & CLOSURE LT FOOT, SLIGHT TENDERNESS     41 y.o. female returns for post-op check.  Says it feels like it is improved  Review of Systems: Negative except as noted in the HPI. Denies N/V/F/Ch.   Objective:  There were no vitals filed for this visit. There is no height or weight on file to calculate BMI. Constitutional Well developed. Well nourished.  Vascular Foot warm and well perfused. Capillary refill normal to all digits.  Calf is soft and supple, no posterior calf or knee pain, negative Homans' sign  Neurologic Normal speech. Oriented to person, place, and time. Epicritic sensation to light touch grossly present bilaterally.  Dermatologic Proximal portions of incision have healed, the distal portions again of had dehiscence with exposed subcutaneous tissue measures 3.5 x 0.5 x 1.0 cm, really no changes in dimensions once overlying hyperkeratosis and nonviable tissue was removed.  No signs of infection no drainage or purulence  Orthopedic: She is mild tenderness to palpation noted about the surgical site.   Radiographs of the left foot taken today show possible questionable lucency in the base of the lateral fourth proximal phalanx   Assessment:   1. Post-operative state   2. 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  S/p foot surgery left -Continues to wax and wane at develops quite a bit of significant hyperkeratotic tissue which is relatively nonviable and once this is debrided the ulcer remains with quite a bit of depth.  X-rays today show questionable lucency in the base of the proximal phalanx.  I discussed with her my concern to be that this is a  possibility of new or recurrent infection.  I did put her back on Augmentin which is consistent with her previous wound cultures for group B strep from the hospital.  I will see her back in 2 weeks to follow-up and if not improved by that point I would recommend an MRI.  For now she will remain out of work for another 6 weeks beyond her current date   Return in about 2 weeks (around 12/28/2021) for wound care.

## 2021-12-15 ENCOUNTER — Ambulatory Visit: Payer: Self-pay | Admitting: *Deleted

## 2021-12-15 ENCOUNTER — Encounter: Payer: Self-pay | Admitting: Adult Health

## 2021-12-15 ENCOUNTER — Ambulatory Visit: Payer: No Typology Code available for payment source | Admitting: Adult Health

## 2021-12-15 VITALS — BP 139/92 | HR 101 | Ht 65.0 in | Wt 200.0 lb

## 2021-12-15 DIAGNOSIS — N921 Excessive and frequent menstruation with irregular cycle: Secondary | ICD-10-CM | POA: Diagnosis not present

## 2021-12-15 DIAGNOSIS — Z30430 Encounter for insertion of intrauterine contraceptive device: Secondary | ICD-10-CM | POA: Diagnosis not present

## 2021-12-15 DIAGNOSIS — Z3202 Encounter for pregnancy test, result negative: Secondary | ICD-10-CM | POA: Insufficient documentation

## 2021-12-15 LAB — TYPE AND SCREEN
ABO/RH(D): A POS
Antibody Screen: POSITIVE
Donor AG Type: NEGATIVE
Donor AG Type: NEGATIVE
Unit division: 0
Unit division: 0

## 2021-12-15 LAB — BPAM RBC
Blood Product Expiration Date: 202308282359
Blood Product Expiration Date: 202309012359
Unit Type and Rh: 600
Unit Type and Rh: 6200

## 2021-12-15 LAB — POCT URINE PREGNANCY: Preg Test, Ur: NEGATIVE

## 2021-12-15 MED ORDER — LEVONORGESTREL 20 MCG/DAY IU IUD
1.0000 | INTRAUTERINE_SYSTEM | Freq: Once | INTRAUTERINE | Status: AC
  Administered 2021-12-15: 1 via INTRAUTERINE

## 2021-12-15 NOTE — Patient Outreach (Addendum)
  Care Coordination   12/15/2021 Name: Lauren [name] MRN: [id number] DOB: 06/08/1980   Care Coordination Outreach Attempts:  An unsuccessful telephone outreach was attempted today to offer the patient information about available care coordination services as a benefit of their health plan.   Follow Up Plan:  Additional outreach attempts will be made to offer the patient care coordination information and services.   Encounter Outcome:  Pt. Request to Call Back  Care Coordination Interventions Activated:  No   Care Coordination Interventions:  No, not indicated    SIG Strother Everitt L. Lavina Hamman, RN, BSN, [place] Coordinator Office number [phone]

## 2021-12-15 NOTE — Patient Outreach (Signed)
  Care Coordination   Follow Up Visit Note   12/15/2021 Name: Destinae Neubecker MRN: [id number] DOB: 11/21/1980  Donye Dauenhauer is a 41 y.o. year old female who sees Lemmie Evens, MD for primary care. I spoke with  [name] by phone today  What matters to the patients health and wellness today?  TIA ED visit, Vaginal bleeding on Eliquis with IUD (replaced on 12/15/21), delayed healing left toe wound, finances, return to work    Goals Addressed               This Visit's Progress     Patient Stated     Manage diabetes and poor wound healing & seek help for coverage & financial resources [facility]) (pt-stated)   Not on track     Care Coordination Interventions: Advised patient to keep in contact with [employer] and her job supervisor related to job, status of disability and insurance coverage Assisted patient with initiation or completion of advanced directives (see documentation in [place]) Reviewed scheduled/upcoming provider appointments including podiatry, neurology Care Guide referral for financial stress Social Work referral for financial resource/strain related potential loss of insurance, decrease of household income by half (only husband's) on 12/09/21 Discussed plans with patient for ongoing care management follow up and provided patient with direct contact information for care management team Counseled on importance of regular laboratory monitoring as prescribed Sent online education on toe amputation and delayed wound healing  12/11/21 collaboration with [facility] SW, referred to [facility] care guides 12/15/21 Encouragement provided as patient is having multiple medical issues (vaginal bleeding on Eliquis, IUD replaced, TIA symptoms with an ED visit, left toe amputation with delayed healing and infection to other toe, finances to get to medical appointments) Discussed EACP services for work difficulties & financial stress. Encouraged attendance to Dietitian & ED follow up with neurologist          SDOH assessments and interventions completed:  Yes  SDOH Interventions Today    Flowsheet Row Most Recent Value  SDOH Interventions   Food Insecurity Interventions Intervention Not Indicated  Social Connections Interventions Intervention Not Indicated  Transportation Interventions Intervention Not Indicated        Care Coordination Interventions Activated:  Yes  Care Coordination Interventions:  Yes, provided   Follow up plan: Follow up call scheduled for 12/22/21 3 pm     Encounter Outcome:  Pt. Visit Completed   Gurtha Picker L. Lavina Hamman, RN, BSN, [place] Coordinator Office number [phone]

## 2021-12-15 NOTE — Patient Instructions (Addendum)
Visit Information  Thank you for taking time to visit with me today. Please don't hesitate to contact me if I can be of assistance to you.   Following are the goals we discussed today:   Goals Addressed               This Visit's Progress     Patient Stated     Manage diabetes and poor wound healing & seek help for coverage & financial resources [facility]) (pt-stated)   Not on track     Care Coordination Interventions: Advised patient to keep in contact with [employer] and her job supervisor related to job, status of disability and insurance coverage Assisted patient with initiation or completion of advanced directives (see documentation in [place]) Reviewed scheduled/upcoming provider appointments including podiatry, neurology Care Guide referral for financial stress Social Work referral for financial resource/strain related potential loss of insurance, decrease of household income by half (only husband's) on 12/09/21 Discussed plans with patient for ongoing care management follow up and provided patient with direct contact information for care management team Counseled on importance of regular laboratory monitoring as prescribed Sent online education on toe amputation and delayed wound healing  12/11/21 collaboration with [facility] SW, referred to [facility] care guides 12/15/21 Encouragement provided as patient is having multiple medical issues (vaginal bleeding on Eliquis, IUD replaced, TIA symptoms with an ED visit, left toe amputation with delayed healing and infection to other toe, finances to get to medical appointments) Discussed EACP services for work difficulties & financial stress. Encouraged attendance to Dietitian & ED follow up with neurologist         Our next appointment is by telephone on 12/22/21 at 2 pm  Please call the care guide team at [phone] if you need to cancel or reschedule your appointment.   If you are experiencing a Mental Health or [place] or need someone  to talk to, please call the Suicide and Crisis Lifeline: 988 call the [place] National Suicide Prevention Lifeline: 9896462890 or TTY: (386) 226-6803 TTY 843-002-1556) to talk to a trained counselor call 1-800-273-TALK (toll free, 24 hour hotline) go to [facility] [address] [place] [phone]) call the [place]: [phone] call 911   Patient verbalizes understanding of instructions and care plan provided today and agrees to view in [place]. Active MyChart status and patient understanding of how to access instructions and care plan via MyChart confirmed with patient.     The patient has been provided with contact information for the care management team and has been advised to call with any health related questions or concerns.   [place] Lavina Hamman, RN, BSN, [place] Coordinator Office number [phone]

## 2021-12-15 NOTE — Addendum Note (Signed)
Addended by: Levy Pupa S on: 12/15/2021 05:00 PM   Modules accepted: Orders

## 2021-12-15 NOTE — Progress Notes (Signed)
Patient ID: Lauren [name], female   DOB: 09/22/1980, 41 y.o.   MRN: [id number]   IUD INSERTION Patient name: Lauren [name] MRN [id number]  Date of birth: 1981-01-11 Subjective Findings:   Omya [name] is a 41 y.o. [phone] Caucasian female being seen today,for her IUD fell out, was having bleeding and clots, is on eliquis and plavix. Had blood clot right arm, with removal in July. Discussed with Dr Elonda Husky and will insert another mirena today. Patient's last menstrual period was 11/01/2021 (approximate). Last sexual intercourse was not asked but she has had a tubal and UPT negative. Last pap: Lab Results  Component Value Date   DIAGPAP  07/23/2020    - Negative for Intraepithelial Lesions or Malignancy (NILM)   DIAGPAP - Benign reactive/reparative changes 07/23/2020   HPVHIGH Negative 07/23/2020     The risks and benefits of the method and placement have been thouroughly reviewed with the patient and all questions were answered.  Specifically the patient is aware of failure rate of 05/998, expulsion of the IUD and of possible perforation.  The patient is aware of irregular bleeding due to the method and understands the incidence of irregular bleeding diminishes with time.  Signed copy of informed consent in chart.      12/09/2021    6:20 PM 07/23/2020   10:01 AM 04/10/2015   10:00 AM 03/17/2015    3:51 PM 03/07/2015    2:34 PM  Depression screen PHQ 2/9  Decreased Interest 0 0 0 0 0  Down, Depressed, Hopeless 1 0 0 0 0  PHQ - 2 Score 1 0 0 0 0  Altered sleeping  1     Tired, decreased energy  0     Change in appetite  3     Feeling bad or failure about yourself   0     Trouble concentrating  0     Moving slowly or fidgety/restless  0     Suicidal thoughts  0     PHQ-9 Score  4           07/23/2020   10:02 AM  GAD 7 : Generalized Anxiety Score  Nervous, Anxious, on Edge 0  Control/stop worrying 0  Worry too much - different things 0  Trouble relaxing 0  Restless 0  Easily  annoyed or irritable 0  Afraid - awful might happen 0  Total GAD 7 Score 0     Pertinent History Reviewed:   Reviewed past medical,surgical, social, obstetrical and family history.  Reviewed problem list, medications and allergies. Objective Findings & Procedure:   Vitals:   12/15/21 1404 12/15/21 1435  BP: (!) 140/90 (!) 139/92  Pulse: (!) 101   Weight: 200 lb (90.7 kg)   Height: 5\' 5"  (1.651 m)   Body mass index is 33.28 kg/m.  Results for orders placed or performed in visit on 12/15/21 (from the past 24 hour(s))  POCT urine pregnancy   Collection Time: 12/15/21  2:10 PM  Result Value Ref Range   Preg Test, Ur Negative Negative     Time out was performed.  A Graves speculum was placed in the vagina.  The cervix was visualized, prepped using Betadine The uterus was found to be neutral and it sounded to 8 cm.  Mirena  IUD placed per manufacturer's recommendations. The strings were trimmed to approximately 3 cm. The patient tolerated the procedure well.   Informal transvaginal sonogram was performed and the proper placement of the IUD was verified, by  A[place].   Chaperone: Levy Pupa   Assessment & Plan:   1)Menorrhagia with irregular cycle 2) Mirena IUD insertion Lot [id number] 2025/[id number] The patient was given post procedure instructions, including signs and symptoms of infection and to check for the strings after each menses or each month, and refraining from intercourse or anything in the vagina for 3 days. She was given a care card with date IUD placed, and date IUD to be removed. She is scheduled for a f/u appointment in 4 weeks.  Orders Placed This Encounter  Procedures   POCT urine pregnancy    Return in about 4 weeks (around 01/12/2022) for IUD check with me .  Derrek Monaco NP  12/15/2021 3:16 PM

## 2021-12-17 ENCOUNTER — Other Ambulatory Visit (HOSPITAL_COMMUNITY): Payer: Self-pay

## 2021-12-17 ENCOUNTER — Encounter: Payer: No Typology Code available for payment source | Attending: Family Medicine | Admitting: Nutrition

## 2021-12-17 ENCOUNTER — Other Ambulatory Visit: Payer: Self-pay | Admitting: Vascular Surgery

## 2021-12-17 ENCOUNTER — Telehealth: Payer: Self-pay

## 2021-12-17 ENCOUNTER — Other Ambulatory Visit: Payer: Self-pay | Admitting: *Deleted

## 2021-12-17 ENCOUNTER — Telehealth: Payer: Self-pay | Admitting: *Deleted

## 2021-12-17 VITALS — Ht 65.0 in | Wt 199.0 lb

## 2021-12-17 DIAGNOSIS — E6609 Other obesity due to excess calories: Secondary | ICD-10-CM | POA: Insufficient documentation

## 2021-12-17 DIAGNOSIS — E1059 Type 1 diabetes mellitus with other circulatory complications: Secondary | ICD-10-CM

## 2021-12-17 DIAGNOSIS — E559 Vitamin D deficiency, unspecified: Secondary | ICD-10-CM

## 2021-12-17 DIAGNOSIS — E66812 Obesity, class 2: Secondary | ICD-10-CM

## 2021-12-17 DIAGNOSIS — I2510 Atherosclerotic heart disease of native coronary artery without angina pectoris: Secondary | ICD-10-CM

## 2021-12-17 DIAGNOSIS — N1832 Chronic kidney disease, stage 3b: Secondary | ICD-10-CM

## 2021-12-17 DIAGNOSIS — Z6839 Body mass index (BMI) 39.0-39.9, adult: Secondary | ICD-10-CM | POA: Insufficient documentation

## 2021-12-17 DIAGNOSIS — Z8673 Personal history of transient ischemic attack (TIA), and cerebral infarction without residual deficits: Secondary | ICD-10-CM

## 2021-12-17 DIAGNOSIS — I10 Essential (primary) hypertension: Secondary | ICD-10-CM

## 2021-12-17 DIAGNOSIS — Z720 Tobacco use: Secondary | ICD-10-CM

## 2021-12-17 DIAGNOSIS — L97501 Non-pressure chronic ulcer of other part of unspecified foot limited to breakdown of skin: Secondary | ICD-10-CM

## 2021-12-17 MED ORDER — APIXABAN 5 MG PO TABS
5.0000 mg | ORAL_TABLET | ORAL | 1 refills | Status: DC
  Filled 2021-12-17: qty 60, 30d supply, fill #0

## 2021-12-17 MED ORDER — ELIQUIS DVT/PE STARTER PACK 5 MG PO TBPK
ORAL_TABLET | ORAL | 0 refills | Status: DC
  Filled 2021-12-17: qty 74, 30d supply, fill #0

## 2021-12-17 NOTE — Patient Instructions (Addendum)
Goals  Increase iron rich foods Focus on more plant based foods Eat 30-45 g CHO at meals. Cut down on diet sodas and cut out crystal light. Go to [email or web address] and watch videos

## 2021-12-17 NOTE — Telephone Encounter (Signed)
Received msg from pharmacy requesting clarification on Eliquis refill for starter pack.  Called pt, no answer, lf vm.  Pt returned call.  Called pt, two identifiers used. Confirmed that pt was still taking Eliquis at 5 mg BID. Pt stated her last pill would be on Tuesday when she comes in to see Dr Carlis Abbott. Informed her a prescription would be sent in to the pharmacy. Confirmed understanding.  Called verbal order into [facility].

## 2021-12-17 NOTE — Telephone Encounter (Signed)
   Telephone encounter was:  Successful.  12/17/2021 Name: Janeshia Ciliberto MRN: [id number] DOB: Jun 19, 1980  Margeart Allender is a 41 y.o. year old female who is a primary care patient of Lemmie Evens, MD . The community resource team was consulted for assistance with Food Insecurity and Financial Difficulties related to illness  patient is going to be out of work until at least october with a possible another surgery coming up will  send paperwork with assistance programs available   Care guide performed the following interventions: Patient provided with information about care guide support team and interviewed to confirm resource needs Follow up call placed to community resources to determine status of patients referral.  Follow Up Plan:  No further follow up planned at this time. The patient has been provided with needed resources.  [place] [phone] [address] , [place] [address] Email : [email or web address]

## 2021-12-17 NOTE — Patient Outreach (Signed)
  Care Coordination   [facility] case discussion  Visit Note   12/17/2021 Name: Lauren [name] MRN: [id number] DOB: 1981-01-22  Lauren [name] is a 41 y.o. year old female who sees Lemmie Evens, MD for primary care. I  spoke with [place] pod 1 team members  What matters to the patients health and wellness today?  Patient progression, care guide referrals for financial needs    Goals Addressed               This Visit's Progress     Patient Stated     Manage diabetes and poor wound healing & seek help for coverage & financial resources [facility]) (pt-stated)   On track     Care Coordination Interventions: Advised patient to keep in contact with [employer] and her job supervisor related to job, status of disability and insurance coverage Assisted patient with initiation or completion of advanced directives (see documentation in [place]) Reviewed scheduled/upcoming provider appointments including podiatry, neurology Care Guide referral for financial stress Social Work referral for financial resource/strain related potential loss of insurance, decrease of household income by half (only husband's) on 12/09/21 Discussed plans with patient for ongoing care management follow up and provided patient with direct contact information for care management team Counseled on importance of regular laboratory monitoring as prescribed Sent online education on toe amputation and delayed wound healing  12/11/21 collaboration with [facility] SW, referred to [facility] care guides 12/15/21 Encouragement provided as patient is having multiple medical issues (vaginal bleeding on Eliquis, IUD replaced, TIA symptoms with an ED visit, left toe amputation with delayed healing and infection to other toe, finances to get to medical appointments) Discussed EACP services for work difficulties & financial stress. Encouraged attendance to Dietitian & ED follow up with neurologist  12/17/21 [facility] case discussion provided for info in 12/15/21  note. Continue to outreach for needs, follow up 12/22/21        SDOH assessments and interventions completed:  No     Care Coordination Interventions Activated:  Yes  Care Coordination Interventions:  Yes, provided   Follow up plan: Follow up call scheduled for 12/22/21    Encounter Outcome:  Pt. Visit Completed   Ziana Heyliger L. Lavina Hamman, RN, BSN, [place] Coordinator Office number [phone]

## 2021-12-17 NOTE — Progress Notes (Signed)
Medical Nutrition Therapy  Employee ID  [phone] Appointment Start time:  6812  Appointment End time:  57  Primary concerns today: Type 1 DM, Wound healing of left foot that won't heal due to high blood sugars. Referral diagnosis: E10.8, L97.501 Preferred learning style: NO preference  Learning readiness: Ready    NUTRITION ASSESSMENT   41 yr old female with Type 1 DM. BS have been poorly controlled. Has a wound on her left foot. She notes she can't get it to heal and her MD wanted her to work with [name] to get her BS under better control and get that wound healed. She sees Rayetta Pigg, FNP with [facility]. 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 Has a Dexcom  and a Omnipod pump. Has been on prednisone for a rash for the last 6-7 days and this has made her BS higher than usual.  Had been on eliquis and had blood clots. Now got IUD in and should help with bleeding. Sees OBGYN for this. Had a mini stroke  and went in ER. No reported residual effects.  She is willing to work with lifestyle medicine to improve her health and reduce complications and co morbidities. Dexcom CGM: Time in Range 13% Very High 39% High 48% In Range 0% Low 0% Very Low Target Range: 70-180 mg/dL   Lab Results  Component Value Date   HGBA1C 9.6 09/29/2021   See Rayetta Pigg, FNP  Changes made recently. Just got the pump and already had dexcom. Anthropometrics  Wt Readings from Last 3 Encounters:  12/17/21 199 lb (90.3 kg)  12/15/21 200 lb (90.7 kg)  11/19/21 199 lb 4.7 oz (90.4 kg)   Ht Readings from Last 3 Encounters:  12/17/21 5\' 5"  (1.651 m)  12/15/21 5\' 5"  (1.651 m)  11/18/21 5\' 5"  (1.651 m)   Body mass index is 33.12 kg/m. @BMIFA @ Facility age limit for growth %iles is 20 years. Facility age limit for growth %iles is 20 years.    Clinical Medical Hx: Type 1 Dm, Obesity, CVA, HTN, Hyperlipidemia, CAD, Vit D Deficiency ,  CKD See chart Medications: Omnipod pump see chart Labs:  Lab Results  Component Value Date   HGBA1C 9.6 09/29/2021      Latest Ref Rng & Units 12/12/2021   12:00 AM 11/20/2021    1:59 AM 11/19/2021    1:49 AM  CMP  Glucose 70 - 99 mg/dL 227  208  318   BUN 6 - 20 mg/dL 23  35  38   Creatinine 0.44 - 1.00 mg/dL 2.03  2.22  2.33   Sodium 135 - 145 mmol/L 135  138  134   Potassium 3.5 - 5.1 mmol/L 3.8  4.0  4.6   Chloride 98 - 111 mmol/L 105  108  108   CO2 22 - 32 mmol/L 23  21  19    Calcium 8.9 - 10.3 mg/dL 8.4  8.1  8.0   Total Protein 6.5 - 8.1 g/dL 5.6     Total Bilirubin 0.3 - 1.2 mg/dL 0.3     Alkaline Phos 38 - 126 U/L 80     AST 15 - 41 U/L 16     ALT 0 - 44 U/L 19       Notable Signs/Symptoms: fatigue  Lifestyle & Dietary Hx Married. Lives with family. Works at [employer] for [employer]. She does the cooking and shopping.  Estimated daily fluid intake: 60 oz Supplements: Vit  D Sleep: varies Stress / self-care: her health, work, family Current average weekly physical activity: ADL has limited mobility due to wound on foot.  24-Hr Dietary Recall First Meal: 1/2 banana and oatmeal, Diet coke, premeir shake, Snack:  Second Meal: Lettuce wrap with chicken, water with crystal light Snack:  Third Meal: Country fried steak,  asparagus, brussel sprouts, Diet Dr pepper Snack:  Beverages: water, diet sodas, crystal light  Estimated Energy Needs Calories: 1200-1500 Carbohydrate: 170g Protein: 112g Fat: 42g   NUTRITION DIAGNOSIS  NB-1.1 Food and nutrition-related knowledge deficit NB-1.2 Harmful beliefs/attitudes about food or nutrition-related topics (use with caution) As related to Diabetes Type 1.  As evidenced by A1C 9.8%.   NUTRITION INTERVENTION  Nutrition education (E-1) on the following topics:  Nutrition and Diabetes education provided on My Plate, CHO counting, meal planning, portion sizes, timing of meals, avoiding snacks between meals unless having a low blood  sugar, target ranges for A1C and blood sugars, signs/symptoms and treatment of hyper/hypoglycemia, monitoring blood sugars, taking medications as prescribed, benefits of exercising 30 minutes per day and prevention of complications of DM.  Lifestyle Medicine  - Whole Food, Plant Predominant Nutrition is highly recommended: Eat Plenty of vegetables, Mushrooms, fruits, Legumes, Whole Grains, Nuts, seeds in lieu of processed meats, processed snacks/pastries red meat, poultry, eggs.    -It is better to avoid simple carbohydrates including: Cakes, Sweet Desserts, Ice Cream, Soda (diet and regular), Sweet Tea, Candies, Chips, Cookies, Store Bought Juices, Alcohol in Excess of  1-2 drinks a day, Lemonade,  Artificial Sweeteners, Doughnuts, Coffee Creamers, "Sugar-free" Products, etc, etc.  This is not a complete list.....  Exercise: If you are able: 30 -60 minutes a day ,4 days a week, or 150 minutes a week.  The longer the better.  Combine stretch, strength, and aerobic activities.  If you were told in the past that you have high risk for cardiovascular diseases, you may seek evaluation by your heart doctor prior to initiating moderate to intense exercise programs.   Handouts Provided Include  Lifestyle medicine handouts  Learning Style & Readiness for Change Teaching method utilized: Visual & Auditory  Demonstrated degree of understanding via: Teach Back  Barriers to learning/adherence to lifestyle change:  motivation  Goals Established by Pt Goals  Increase iron rich foods Focus on more plant based foods Eat 30-45 g CHO at meals. Cut down on diet sodas and cut out crystal light. Go to [email or web address] and watch videos   MONITORING & EVALUATION Dietary intake, weekly physical activity, and BS's  in 1 month.  Next Steps  Patient is to work on better meal planning and meal prepping to get blood sugars undercontroll.Marland Kitchen

## 2021-12-18 ENCOUNTER — Other Ambulatory Visit (HOSPITAL_COMMUNITY): Payer: Self-pay

## 2021-12-21 ENCOUNTER — Ambulatory Visit (HOSPITAL_COMMUNITY)
Admission: RE | Admit: 2021-12-21 | Discharge: 2021-12-21 | Disposition: A | Discharge status: Home / Self Care | Payer: No Typology Code available for payment source | Source: Ambulatory Visit | Attending: Vascular Surgery | Admitting: Vascular Surgery

## 2021-12-21 ENCOUNTER — Other Ambulatory Visit (HOSPITAL_COMMUNITY): Payer: Self-pay

## 2021-12-21 DIAGNOSIS — I70228 Atherosclerosis of native arteries of extremities with rest pain, other extremity: Secondary | ICD-10-CM | POA: Insufficient documentation

## 2021-12-22 ENCOUNTER — Ambulatory Visit: Payer: Self-pay | Admitting: *Deleted

## 2021-12-22 ENCOUNTER — Ambulatory Visit (INDEPENDENT_AMBULATORY_CARE_PROVIDER_SITE_OTHER): Payer: No Typology Code available for payment source | Admitting: Vascular Surgery

## 2021-12-22 ENCOUNTER — Encounter: Payer: Self-pay | Admitting: Vascular Surgery

## 2021-12-22 ENCOUNTER — Encounter: Payer: Self-pay | Admitting: *Deleted

## 2021-12-22 VITALS — BP 128/85 | HR 95 | Temp 97.8°F | Resp 14 | Ht 65.0 in | Wt 200.0 lb

## 2021-12-22 DIAGNOSIS — I70228 Atherosclerosis of native arteries of extremities with rest pain, other extremity: Secondary | ICD-10-CM

## 2021-12-22 NOTE — Patient Instructions (Addendum)
Visit Information  Thank you for taking time to visit with me today. Please don't hesitate to contact me if I can be of assistance to you.   This is the number for [facility] Information Management Department (Medical records) [phone] fax [phone] [email or web address] If you would like to receive copies of your medical record or have them sent to someone else, please download and complete the [facility] Patient Access Form. There is no charge to have your medical records sent to another healthcare provider. For other requests, fees may apply. You may also email your request/questions to [email or web address]. Please include your name, date of birth, what date you were treated and the facility where you were treated   For [facility] Financial assistance  [email or web address] or [phone]  Following are the goals we discussed today:   Goals Addressed               This Visit's Progress     Patient Stated     Manage diabetes and poor wound healing  ([facility]) (pt-stated)   On track     Care Coordination Interventions: Counseled on importance of regular laboratory monitoring as prescribed Sent online education on toe amputation and delayed wound healing  12/15/21 Encouragement provided as patient is having multiple medical issues (vaginal bleeding on Eliquis, IUD replaced, TIA symptoms with an ED visit, left toe amputation with delayed healing and infection to other toe, finances to get to medical appointments) Discussed EACP services for work difficulties & financial stress. Encouraged attendance to Dietitian & ED follow up with neurologist  12/17/21 [facility] case discussion provided for info in 12/15/21 note. Continue to outreach for needs, follow up 12/22/21 12/22/21 assessed for progression of left toe wound. Encouragement provided      seek help for coverage & financial resources (pt-stated)    On track     11/24/21  Care Coordination Interventions: Evaluation of current treatment plan related to medical coverage and financial resources and patient's adherence to plan as established by provider Advised patient to keep in contact with [employer] and her job supervisor related to job, status of disability and insurance coverage Reviewed scheduled/upcoming provider appointments including podiatry, neurology Care Guide referral for financial stress Social Work referral for financial resource/strain related potential loss of insurance, decrease of household income by half (only husband's) on 12/09/21 Discussed plans with patient for ongoing care management follow up and provided patient with direct contact information for care management team 12/11/21 collaboration with [place], referred to [facility] care guides for financial resources Discussed EACP services for work difficulties & financial stress. 12/17/21 [facility] case discussion provided for info in 12/15/21 note. Continue to outreach for needs, follow up 12/22/21 12/22/21 follow up on her progress with financial resources provided by [facility] care guide. She is still working on Management consultant to providers to make sure information is sent correctly to [employer]. RN CM followed up on the [employer]. E-mail sent to [place], patient and the [employer].         Our next appointment is by telephone on 01/11/22 at 3 pm  Please call the care guide team at [phone] if you need to cancel or reschedule your appointment.   If you are experiencing a Mental Health or [place] or need someone to talk to, please call the Suicide and Crisis Lifeline: 988 call the [place] National Suicide Prevention Lifeline: (825)200-5806 or TTY: 580-278-3212 TTY 253-824-9072) to talk to a trained counselor  call 1-800-273-TALK (toll free, 24 hour hotline) call the [facility]: [phone] call 911   Patient verbalizes understanding  of instructions and care plan provided today and agrees to view in [place]. Active MyChart status and patient understanding of how to access instructions and care plan via MyChart confirmed with patient.     The patient has been provided with contact information for the care management team and has been advised to call with any health related questions or concerns.   [place] Lavina Hamman, RN, BSN, [place] Coordinator Office number [phone]

## 2021-12-22 NOTE — Patient Outreach (Addendum)
Care Coordination   Follow Up Visit Note   12/22/2021 Name: Lauren [name] MRN: [id number] DOB: 08/12/1980  Lauren [name] is a 41 y.o. year old female who sees Lauren Evens, MD for primary care. I spoke with  Lauren [name] by phone today  What matters to the patients health and wellness today?  Continues to complete forms for disability and from resources provided by [facility] care guide services     Goals Addressed               This Visit's Progress     Patient Stated     Manage diabetes and poor wound healing  ([facility]) (pt-stated)   On track     Care Coordination Interventions: Counseled on importance of regular laboratory monitoring as prescribed Sent online education on toe amputation and delayed wound healing  12/15/21 Encouragement provided as patient is having multiple medical issues (vaginal bleeding on Eliquis, IUD replaced, TIA symptoms with an ED visit, left toe amputation with delayed healing and infection to other toe, finances to get to medical appointments) Discussed EACP services for work difficulties & financial stress. Encouraged attendance to Dietitian & ED follow up with neurologist  12/17/21 [facility] case discussion provided for info in 12/15/21 note. Continue to outreach for needs, follow up 12/22/21 12/22/21 assessed for progression of left toe wound. Encouragement provided      seek help for coverage & financial resources (pt-stated)   On track     11/24/21  Care Coordination Interventions: Evaluation of current treatment plan related to medical coverage and financial resources and patient's adherence to plan as established by provider Advised patient to keep in contact with [employer] and her job supervisor related to job, status of disability and insurance coverage Reviewed scheduled/upcoming provider appointments including podiatry, neurology Care Guide referral for financial stress Social Work referral for financial resource/strain related potential loss of  insurance, decrease of household income by half (only husband's) on 12/09/21 Discussed plans with patient for ongoing care management follow up and provided patient with direct contact information for care management team 12/11/21 collaboration with Lauren [name], referred to [facility] care guides for financial resources Discussed EACP services for work difficulties & financial stress. 12/17/21 [facility] case discussion provided for info in 12/15/21 note. Continue to outreach for needs, follow up 12/22/21 12/22/21 follow up on her progress with financial resources provided by [facility] care guide. She is still working on Management consultant to providers to make sure information is sent correctly to [employer]. RN CM followed up on the [employer]. E-mail sent to Lauren [name], patient and the [employer].         SDOH assessments and interventions completed:  Yes  SDOH Interventions Today    Flowsheet Row Most Recent Value  SDOH Interventions   Food Insecurity Interventions Intervention Not Indicated  Financial Strain Interventions Other (Comment), [id number] Referral  [referred to [facility] SW care guide and her [facility] supervisor- [employer] email sent 12/22/21]  Stress Interventions Other (Comment)  [previous referral to [place] stress related to pending loss of insurance coverage, job Husband only income provider]  Transportation Interventions Intervention Not Indicated        Care Coordination Interventions Activated:  Yes   Care Coordination Interventions:  Yes, provided    Follow up plan: Follow up call scheduled for 01/11/22 3 pm     Encounter Outcome:  Pt. Visit Completed   Lauren [name] L. Lavina Hamman, RN, BSN, [place] Coordinator Office number [phone]

## 2021-12-22 NOTE — Progress Notes (Signed)
Patient name: Lauren [name] MRN: [id number] DOB: 1980/08/17 Sex: female  REASON FOR VISIT: Post-op   HPI: Lauren [name] is a 41 y.o. female with multiple risk factors including coronary artery disease, CHF, diabetes, stage III CKD that presents for postop check.  She underwent a right brachial, radial and ulnar artery thrombectomy on 11/18/2021 when she presented with an ischemic right upper extremity.  No evidence of atrial fibrillation or other arrhythmia.  Echocardiogram performed in the hospital did not show any vegetation etc.  I reviewed her CTA of her upper extremity and chest portion did not show any proximal embolic lesion.  She states her hand is doing better other than some numbness in the thumb.  She remains on Eliquis.  Past Medical History:  Diagnosis Date   Anemia    CAD (coronary artery disease)    a. s/p cath in 03/2014 showing 30% mid-LAD, moderate to severe disease along small D1, patent LCx, moderate to severe distal OM2 stenosis and moderate diffuse diease along RCA not amenable to PCI   CHF (congestive heart failure) ([place])    a. EF 55-60% in 12/2019 b. EF at 35-40% by echo in 05/2020   Diabetes mellitus without complication ([place])    Myocardial infarction [facility])    Neuropathy    Renal disorder    stage 3 kidney disease   Stroke [facility])     Past Surgical History:  Procedure Laterality Date   AMPUTATION Left 09/02/2021   Procedure: AMPUTATION RAY;  Surgeon: Criselda Peaches, DPM;  Location: [place];  Service: Podiatry;  Laterality: Left;  sagittal saw, 3L bag saline & Pulse   Cardiac catherization     CHOLECYSTECTOMY     I & D EXTREMITY Left 08/30/2021   Procedure: IRRIGATION AND DEBRIDEMENT WITH BONE BIOPSY;  Surgeon: [name], DPM;  Location: [place];  Service: Podiatry;  Laterality: Left;   IRRIGATION AND DEBRIDEMENT FOOT Left 09/04/2021   Procedure: IRRIGATION AND DEBRIDEMENT FOOT AND CLOSURE;  Surgeon: Criselda Peaches, DPM;  Location: [place];  Service: Podiatry;   Laterality: Left;   THROMBECTOMY BRACHIAL ARTERY Right 11/18/2021   Procedure: RIGHT BRACHIAL, RADIAL, & ULNAR ARTERY THROMBECTOMY.;  Surgeon: Marty Heck, MD;  Location: [facility];  Service: Vascular;  Laterality: Right;   TUBAL LIGATION      Family History  Problem Relation Age of Onset   Thyroid disease Mother    Hypertension Mother    Hyperlipidemia Mother    CAD Mother    CVA Mother    Hyperlipidemia Father    Hypertension Father    CAD Father    Breast cancer Paternal Grandmother 19   Cancer Paternal Grandmother        lung    SOCIAL HISTORY: Social History   Tobacco Use   Smoking status: Former    Packs/day: 0.25    Types: Cigarettes    Quit date: 10/27/2020    Years since quitting: 1.1   Smokeless tobacco: Never  Substance Use Topics   Alcohol use: No    Alcohol/week: 0.0 standard drinks of alcohol    Allergies  Allergen Reactions   Wellbutrin [Bupropion] Hives   Cefepime Rash    Tolerates penicilllin   Ciprofloxacin Hcl Hives and Rash    Hives/rash at injection site    Tape Rash    Current Outpatient Medications  Medication Sig Dispense Refill   amoxicillin-clavulanate (AUGMENTIN) 875-125 MG tablet Take 1 tablet by mouth 2 (two) times daily for 14  days. 28 tablet 0   apixaban (ELIQUIS) 5 MG TABS tablet Take 1 tablet by mouth twice daily 60 tablet 1   atorvastatin (LIPITOR) 40 MG tablet TAKE 1 TABLET BY MOUTH ONCE DAILY. (Patient taking differently: Take 40 mg by mouth daily.) 90 tablet 1   clopidogrel (PLAVIX) 75 MG tablet Take 1 tablet (75 mg total) by mouth daily. 90 tablet 3   Continuous Blood Gluc Sensor (DEXCOM G6 SENSOR) MISC Change sensor every 10 days as directed 9 each 3   Continuous Blood Gluc Transmit (DEXCOM G6 TRANSMITTER) MISC Change transmitter every 90 days as directed. 1 each 3   fenofibrate 160 MG tablet TAKE 1 TABLET BY MOUTH ONCE DAILY. (Patient taking differently: Take 160 mg by mouth daily.) 90 tablet 3   furosemide (LASIX) 40  MG tablet Take 1 tablet (40 mg total) by mouth daily. Take daily or as directed (Patient taking differently: Take 40 mg by mouth daily as needed for fluid or edema.) 90 tablet 3   hydrALAZINE (APRESOLINE) 25 MG tablet Take 1 & 1/2 tablets (37.5 mg total) by mouth 3 (three) times daily. (Patient taking differently: Take 25 mg by mouth 3 (three) times daily.) 135 tablet 5   Insulin Disposable Pump (OMNIPOD 5 G6 INTRO, GEN 5,) KIT Change pod every 48-72 hrs 1 kit 0   Insulin Disposable Pump (OMNIPOD 5 G6 POD, GEN 5,) MISC Change pod every 48-72 hours 25 each 6   isosorbide mononitrate (IMDUR) 30 MG 24 hr tablet Take 1 tablet (30 mg total) by mouth daily. 90 tablet 3   levothyroxine (SYNTHROID) 175 MCG tablet Take 175 mcg by mouth daily before breakfast.     metoprolol succinate (TOPROL-XL) 25 MG 24 hr tablet Take 1 tablet (25 mg total) by mouth daily. 90 tablet 3   nitroGLYCERIN (NITROSTAT) 0.4 MG SL tablet Place 1 tablet (0.4 mg total) under the tongue every 5 (five) minutes x 3 doses as needed for chest pain. 25 tablet 3   oxyCODONE (OXY IR/ROXICODONE) 5 MG immediate release tablet Take 1 tablet (5 mg total) by mouth every 6 (six) hours as needed for moderate pain. 20 tablet 0   pantoprazole (PROTONIX) 40 MG tablet Take 1 tablet (40 mg total) by mouth daily as needed for acid reflux (Patient taking differently: Take 40 mg by mouth daily as needed (heartburn).) 90 tablet 2   potassium chloride SA (KLOR-CON M) 20 MEQ tablet Take 1 tablet (20 mEq total) by mouth daily 90 tablet 3   Apixaban Starter Pack, 560m and 546m (ELIQUIS DVT/PE STARTER PACK) Take as directed on package: start with two-60m82mablets twice daily for 7 days. On day 8, switch to one-60mg76mblet twice daily. (Take two tabs (10mg87mice daily through 7/27, then on 7/28 reduce to 1 tab (60mg) 52mce daily.) (Patient not taking: Reported on 12/22/2021) 74 each 0   predniSONE (DELTASONE) 5 MG tablet 6 day taper; Take as directed on written  instructions provided during visit (Patient not taking: Reported on 12/22/2021) 21 tablet 0   No current facility-administered medications for this visit.    REVIEW OF SYSTEMS:  _0  denotes positive finding, _1  denotes negative finding Cardiac  Comments:  Chest pain or chest pressure:    Shortness of breath upon exertion:    Short of breath when lying flat:    Irregular heart rhythm:        Vascular    Pain in calf, thigh, or hip brought on by ambulation:  Pain in feet at night that wakes you up from your sleep:     Blood clot in your veins:    Leg swelling:         Pulmonary    Oxygen at home:    Productive cough:     Wheezing:         Neurologic    Sudden weakness in arms or legs:     Sudden numbness in arms or legs:     Sudden onset of difficulty speaking or slurred speech:    Temporary loss of vision in one eye:     Problems with dizziness:         Gastrointestinal    Blood in stool:     Vomited blood:         Genitourinary    Burning when urinating:     Blood in urine:        Psychiatric    Major depression:         Hematologic    Bleeding problems:    Problems with blood clotting too easily:        Skin    Rashes or ulcers:        Constitutional    Fever or chills:      PHYSICAL EXAM: Vitals:   12/22/21 0851  BP: 128/85  Pulse: 95  Resp: 14  Temp: 97.8 F (36.6 C)  TempSrc: Temporal  SpO2: 98%  Weight: 200 lb (90.7 kg)  Height: _0  (1.651 m)    GENERAL: The patient is a well-nourished female, in no acute distress. The vital signs are documented above. CARDIAC: There is a regular rate and rhythm.  VASCULAR:  Right ulnar pulse palpable Right palmar arch signal triphasic Right hand warm no wounds   DATA:   Right upper extremity arterial duplex shows triphasic waveforms.  Assessment/Plan:  41 y.o. female with multiple risk factors including coronary artery disease, CHF, diabetes, stage III CKD that presents for postop check.  She  underwent a right brachial, radial and ulnar artery thrombectomy on 11/18/2021 when she presented with an ischemic right upper extremity.  Overall pleased with her progress as she has a palpable ulnar pulse with a triphasic palmar arch signal.  Right upper extremity arterial duplex shows triphasic waveforms with no occlusive disease.  Discussed she needs to be on a minimum of 6 months of Eliquis from my standpoint.  In the hospital echocardiogram did not reveal any cardioembolic source.  She denies any history of arrhythmia.  CTA also did not show any proximal embolic source.  I will send her to heme-onc for hypercoagulable evaluation to ensure that she does not need lifelong anticoagulation.  Follow-up in 6 months in the [place] [facility].   Marty Heck, MD [facility] Office: [phone]

## 2021-12-23 ENCOUNTER — Telehealth: Payer: Self-pay | Admitting: Hematology and Oncology

## 2021-12-23 NOTE — Telephone Encounter (Signed)
Scheduled appt per 8/22 referral. Pt is aware of appt date and time. Pt is aware to arrive 15 mins prior to appt time and to bring and updated insurance card. Pt is aware of appt location.   

## 2021-12-28 ENCOUNTER — Ambulatory Visit (INDEPENDENT_AMBULATORY_CARE_PROVIDER_SITE_OTHER): Payer: No Typology Code available for payment source | Admitting: Podiatry

## 2021-12-28 ENCOUNTER — Ambulatory Visit: Payer: No Typology Code available for payment source | Admitting: Podiatry

## 2021-12-28 DIAGNOSIS — E10621 Type 1 diabetes mellitus with foot ulcer: Secondary | ICD-10-CM | POA: Diagnosis not present

## 2021-12-28 DIAGNOSIS — L97425 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 without evidence of necrosis: Secondary | ICD-10-CM

## 2021-12-28 MED ORDER — AMOXICILLIN-POT CLAVULANATE 875-125 MG PO TABS: 1.0000 | ORAL_TABLET | Freq: Two times a day (BID) | ORAL | 0 refills | Status: DC

## 2021-12-28 NOTE — Progress Notes (Signed)
  Subjective:  Patient ID: Lauren [name], female    DOB: 1981-02-08,  MRN: [id number]  Chief Complaint  Patient presents with   Routine Post Op    POV #4 DOS 10/29/2021 WOUND EXCISION & CLOSURE LT FOOT/ wound care- patient is taking Augmentin as ordered.      41 y.o. female returns for post-op check.     Review of Systems: Negative except as noted in the HPI. Denies N/V/F/Ch.   Objective:  There were no vitals filed for this visit. There is no height or weight on file to calculate BMI. Constitutional Well developed. Well nourished.  Vascular Foot warm and well perfused. Capillary refill normal to all digits.  Calf is soft and supple, no posterior calf or knee pain, negative Homans' sign  Neurologic Normal speech. Oriented to person, place, and time. Epicritic sensation to light touch grossly present bilaterally.  Dermatologic Proximal portions of incision have healed, the distal portions again of had dehiscence with exposed subcutaneous tissue measures 3.5 x 0.5 x 1.0 cm, really no changes in dimensions once overlying hyperkeratosis and nonviable tissue was removed.  No signs of infection no drainage or purulence  Orthopedic: She is mild tenderness to palpation noted about the surgical site.       Radiographs of the left foot taken today show possible questionable lucency in the base of the lateral fourth proximal phalanx   Assessment:   1. 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  S/p foot surgery left Really has had not much improvement.  I debrided the lesion in excisional manner with a sharp scalpel to subcutaneous tissue.  Postdebridement measurements are noted above.  I recommend an MRI to evaluate for contiguous or residual osteomyelitis.  We will also keep her on Augmentin refill was sent.  I will talk  to Dr. Carlis Abbott about possibility of angiography.  Return in about 2 weeks (around 01/11/2022) for wound care.

## 2021-12-28 NOTE — Patient Instructions (Signed)
Call [facility] to schedule your MRI at the below locations.  Please allow at least 1 business day after your visit to process the referral.  It may take longer depending on approval from insurance.  Please let me know if you have issues or problems scheduling the MRI   [facility] [phone] [address] [place], [place] [address]  [facility] [place] [phone] [address] [place],  [address]  

## 2021-12-29 ENCOUNTER — Encounter: Payer: Self-pay | Admitting: Nutrition

## 2021-12-29 ENCOUNTER — Other Ambulatory Visit (HOSPITAL_COMMUNITY): Payer: Self-pay

## 2022-01-01 DIAGNOSIS — I639 Cerebral infarction, unspecified: Secondary | ICD-10-CM

## 2022-01-01 HISTORY — DX: Cerebral infarction, unspecified: I63.9

## 2022-01-02 ENCOUNTER — Ambulatory Visit
Admission: RE | Admit: 2022-01-02 | Discharge: 2022-01-02 | Disposition: A | Discharge status: Home / Self Care | Payer: No Typology Code available for payment source | Source: Ambulatory Visit | Attending: Podiatry | Admitting: Podiatry

## 2022-01-02 DIAGNOSIS — E10621 Type 1 diabetes mellitus with foot ulcer: Secondary | ICD-10-CM

## 2022-01-05 ENCOUNTER — Encounter: Payer: Self-pay | Admitting: Podiatry

## 2022-01-05 NOTE — Telephone Encounter (Signed)
I reviewed and went over the MRI results we discussed that likely fourth metatarsal head resection and proximal phalangeal resection will be necessary, would like to ideally do this following revascularization if this is indicated.  I will discuss with Dr. Carlis Abbott again tomorrow what timing of this would look like.  May need to do this as postop admission after angiography and resection thereafter

## 2022-01-08 ENCOUNTER — Inpatient Hospital Stay (HOSPITAL_COMMUNITY): Payer: No Typology Code available for payment source | Admitting: Anesthesiology

## 2022-01-08 ENCOUNTER — Emergency Department (HOSPITAL_COMMUNITY): Payer: No Typology Code available for payment source

## 2022-01-08 ENCOUNTER — Inpatient Hospital Stay (HOSPITAL_COMMUNITY): Payer: No Typology Code available for payment source

## 2022-01-08 ENCOUNTER — Encounter (HOSPITAL_COMMUNITY): Payer: Self-pay

## 2022-01-08 ENCOUNTER — Inpatient Hospital Stay (HOSPITAL_COMMUNITY)
Admission: EM | Admit: 2022-01-08 | Discharge: 2022-01-28 | DRG: 023 | Disposition: A | Discharge status: Rehabilitation Facility | Payer: No Typology Code available for payment source | Attending: Family Medicine | Admitting: Family Medicine

## 2022-01-08 ENCOUNTER — Encounter (HOSPITAL_COMMUNITY)
Admission: EM | Disposition: A | Discharge status: Rehabilitation Facility | Payer: Self-pay | Source: Home / Self Care | Attending: Family Medicine

## 2022-01-08 DIAGNOSIS — I6389 Other cerebral infarction: Secondary | ICD-10-CM | POA: Diagnosis not present

## 2022-01-08 DIAGNOSIS — D72829 Elevated white blood cell count, unspecified: Secondary | ICD-10-CM | POA: Diagnosis present

## 2022-01-08 DIAGNOSIS — G453 Amaurosis fugax: Secondary | ICD-10-CM | POA: Diagnosis present

## 2022-01-08 DIAGNOSIS — R000 Tachycardia, unspecified: Secondary | ICD-10-CM | POA: Diagnosis not present

## 2022-01-08 DIAGNOSIS — R270 Ataxia, unspecified: Secondary | ICD-10-CM | POA: Diagnosis present

## 2022-01-08 DIAGNOSIS — E119 Type 2 diabetes mellitus without complications: Secondary | ICD-10-CM | POA: Diagnosis not present

## 2022-01-08 DIAGNOSIS — I493 Ventricular premature depolarization: Secondary | ICD-10-CM | POA: Diagnosis not present

## 2022-01-08 DIAGNOSIS — I5043 Acute on chronic combined systolic (congestive) and diastolic (congestive) heart failure: Secondary | ICD-10-CM | POA: Diagnosis not present

## 2022-01-08 DIAGNOSIS — Z8249 Family history of ischemic heart disease and other diseases of the circulatory system: Secondary | ICD-10-CM

## 2022-01-08 DIAGNOSIS — N179 Acute kidney failure, unspecified: Secondary | ICD-10-CM | POA: Diagnosis not present

## 2022-01-08 DIAGNOSIS — R1310 Dysphagia, unspecified: Secondary | ICD-10-CM | POA: Diagnosis present

## 2022-01-08 DIAGNOSIS — B951 Streptococcus, group B, as the cause of diseases classified elsewhere: Secondary | ICD-10-CM | POA: Diagnosis present

## 2022-01-08 DIAGNOSIS — E039 Hypothyroidism, unspecified: Secondary | ICD-10-CM | POA: Diagnosis present

## 2022-01-08 DIAGNOSIS — I2510 Atherosclerotic heart disease of native coronary artery without angina pectoris: Secondary | ICD-10-CM | POA: Diagnosis not present

## 2022-01-08 DIAGNOSIS — E6609 Other obesity due to excess calories: Secondary | ICD-10-CM | POA: Diagnosis present

## 2022-01-08 DIAGNOSIS — E10649 Type 1 diabetes mellitus with hypoglycemia without coma: Secondary | ICD-10-CM | POA: Diagnosis not present

## 2022-01-08 DIAGNOSIS — R7989 Other specified abnormal findings of blood chemistry: Secondary | ICD-10-CM

## 2022-01-08 DIAGNOSIS — I6521 Occlusion and stenosis of right carotid artery: Secondary | ICD-10-CM | POA: Diagnosis not present

## 2022-01-08 DIAGNOSIS — R471 Dysarthria and anarthria: Secondary | ICD-10-CM | POA: Diagnosis present

## 2022-01-08 DIAGNOSIS — R079 Chest pain, unspecified: Secondary | ICD-10-CM | POA: Diagnosis not present

## 2022-01-08 DIAGNOSIS — I5020 Unspecified systolic (congestive) heart failure: Secondary | ICD-10-CM | POA: Diagnosis not present

## 2022-01-08 DIAGNOSIS — I63231 Cerebral infarction due to unspecified occlusion or stenosis of right carotid arteries: Secondary | ICD-10-CM

## 2022-01-08 DIAGNOSIS — E1165 Type 2 diabetes mellitus with hyperglycemia: Secondary | ICD-10-CM | POA: Diagnosis not present

## 2022-01-08 DIAGNOSIS — M86672 Other chronic osteomyelitis, left ankle and foot: Secondary | ICD-10-CM | POA: Diagnosis not present

## 2022-01-08 DIAGNOSIS — M86172 Other acute osteomyelitis, left ankle and foot: Secondary | ICD-10-CM | POA: Diagnosis present

## 2022-01-08 DIAGNOSIS — E1042 Type 1 diabetes mellitus with diabetic polyneuropathy: Secondary | ICD-10-CM | POA: Diagnosis present

## 2022-01-08 DIAGNOSIS — L97509 Non-pressure chronic ulcer of other part of unspecified foot with unspecified severity: Secondary | ICD-10-CM | POA: Diagnosis not present

## 2022-01-08 DIAGNOSIS — E1059 Type 1 diabetes mellitus with other circulatory complications: Secondary | ICD-10-CM | POA: Diagnosis not present

## 2022-01-08 DIAGNOSIS — Z20822 Contact with and (suspected) exposure to covid-19: Secondary | ICD-10-CM | POA: Diagnosis present

## 2022-01-08 DIAGNOSIS — R4701 Aphasia: Secondary | ICD-10-CM | POA: Diagnosis present

## 2022-01-08 DIAGNOSIS — I255 Ischemic cardiomyopathy: Secondary | ICD-10-CM | POA: Diagnosis not present

## 2022-01-08 DIAGNOSIS — Z716 Tobacco abuse counseling: Secondary | ICD-10-CM

## 2022-01-08 DIAGNOSIS — Z6839 Body mass index (BMI) 39.0-39.9, adult: Secondary | ICD-10-CM | POA: Diagnosis not present

## 2022-01-08 DIAGNOSIS — I7771 Dissection of carotid artery: Secondary | ICD-10-CM | POA: Diagnosis present

## 2022-01-08 DIAGNOSIS — I7025 Atherosclerosis of native arteries of other extremities with ulceration: Secondary | ICD-10-CM | POA: Diagnosis not present

## 2022-01-08 DIAGNOSIS — E1010 Type 1 diabetes mellitus with ketoacidosis without coma: Secondary | ICD-10-CM | POA: Diagnosis present

## 2022-01-08 DIAGNOSIS — F419 Anxiety disorder, unspecified: Secondary | ICD-10-CM | POA: Diagnosis present

## 2022-01-08 DIAGNOSIS — R339 Retention of urine, unspecified: Secondary | ICD-10-CM | POA: Diagnosis not present

## 2022-01-08 DIAGNOSIS — D6862 Lupus anticoagulant syndrome: Secondary | ICD-10-CM | POA: Diagnosis present

## 2022-01-08 DIAGNOSIS — I10 Essential (primary) hypertension: Secondary | ICD-10-CM | POA: Diagnosis not present

## 2022-01-08 DIAGNOSIS — I2583 Coronary atherosclerosis due to lipid rich plaque: Secondary | ICD-10-CM | POA: Diagnosis not present

## 2022-01-08 DIAGNOSIS — Z79899 Other long term (current) drug therapy: Secondary | ICD-10-CM

## 2022-01-08 DIAGNOSIS — R778 Other specified abnormalities of plasma proteins: Secondary | ICD-10-CM | POA: Diagnosis not present

## 2022-01-08 DIAGNOSIS — Z66 Do not resuscitate: Secondary | ICD-10-CM | POA: Diagnosis present

## 2022-01-08 DIAGNOSIS — Z89422 Acquired absence of other left toe(s): Secondary | ICD-10-CM

## 2022-01-08 DIAGNOSIS — I959 Hypotension, unspecified: Secondary | ICD-10-CM | POA: Diagnosis not present

## 2022-01-08 DIAGNOSIS — Z8616 Personal history of COVID-19: Secondary | ICD-10-CM | POA: Diagnosis not present

## 2022-01-08 DIAGNOSIS — G8194 Hemiplegia, unspecified affecting left nondominant side: Secondary | ICD-10-CM | POA: Diagnosis present

## 2022-01-08 DIAGNOSIS — I5042 Chronic combined systolic (congestive) and diastolic (congestive) heart failure: Secondary | ICD-10-CM | POA: Diagnosis not present

## 2022-01-08 DIAGNOSIS — Z87891 Personal history of nicotine dependence: Secondary | ICD-10-CM

## 2022-01-08 DIAGNOSIS — I63511 Cerebral infarction due to unspecified occlusion or stenosis of right middle cerebral artery: Secondary | ICD-10-CM | POA: Diagnosis not present

## 2022-01-08 DIAGNOSIS — E785 Hyperlipidemia, unspecified: Secondary | ICD-10-CM | POA: Diagnosis present

## 2022-01-08 DIAGNOSIS — L97529 Non-pressure chronic ulcer of other part of left foot with unspecified severity: Secondary | ICD-10-CM | POA: Diagnosis present

## 2022-01-08 DIAGNOSIS — D649 Anemia, unspecified: Secondary | ICD-10-CM | POA: Diagnosis not present

## 2022-01-08 DIAGNOSIS — R509 Fever, unspecified: Secondary | ICD-10-CM | POA: Diagnosis not present

## 2022-01-08 DIAGNOSIS — J9601 Acute respiratory failure with hypoxia: Secondary | ICD-10-CM | POA: Diagnosis present

## 2022-01-08 DIAGNOSIS — R531 Weakness: Secondary | ICD-10-CM | POA: Diagnosis not present

## 2022-01-08 DIAGNOSIS — N1411 Contrast-induced nephropathy: Secondary | ICD-10-CM | POA: Diagnosis not present

## 2022-01-08 DIAGNOSIS — E878 Other disorders of electrolyte and fluid balance, not elsewhere classified: Secondary | ICD-10-CM | POA: Diagnosis not present

## 2022-01-08 DIAGNOSIS — R29810 Facial weakness: Secondary | ICD-10-CM | POA: Diagnosis present

## 2022-01-08 DIAGNOSIS — I69354 Hemiplegia and hemiparesis following cerebral infarction affecting left non-dominant side: Secondary | ICD-10-CM | POA: Diagnosis not present

## 2022-01-08 DIAGNOSIS — Z006 Encounter for examination for normal comparison and control in clinical research program: Secondary | ICD-10-CM | POA: Diagnosis not present

## 2022-01-08 DIAGNOSIS — I21A1 Myocardial infarction type 2: Secondary | ICD-10-CM | POA: Diagnosis present

## 2022-01-08 DIAGNOSIS — J690 Pneumonitis due to inhalation of food and vomit: Secondary | ICD-10-CM | POA: Diagnosis not present

## 2022-01-08 DIAGNOSIS — D631 Anemia in chronic kidney disease: Secondary | ICD-10-CM | POA: Diagnosis present

## 2022-01-08 DIAGNOSIS — Z9641 Presence of insulin pump (external) (internal): Secondary | ICD-10-CM | POA: Diagnosis present

## 2022-01-08 DIAGNOSIS — Z79890 Hormone replacement therapy (postmenopausal): Secondary | ICD-10-CM

## 2022-01-08 DIAGNOSIS — I25110 Atherosclerotic heart disease of native coronary artery with unstable angina pectoris: Secondary | ICD-10-CM | POA: Diagnosis present

## 2022-01-08 DIAGNOSIS — I639 Cerebral infarction, unspecified: Secondary | ICD-10-CM | POA: Diagnosis not present

## 2022-01-08 DIAGNOSIS — R2681 Unsteadiness on feet: Secondary | ICD-10-CM | POA: Diagnosis not present

## 2022-01-08 DIAGNOSIS — G4700 Insomnia, unspecified: Secondary | ICD-10-CM | POA: Diagnosis not present

## 2022-01-08 DIAGNOSIS — I252 Old myocardial infarction: Secondary | ICD-10-CM

## 2022-01-08 DIAGNOSIS — Z7902 Long term (current) use of antithrombotics/antiplatelets: Secondary | ICD-10-CM

## 2022-01-08 DIAGNOSIS — F17210 Nicotine dependence, cigarettes, uncomplicated: Secondary | ICD-10-CM | POA: Diagnosis present

## 2022-01-08 DIAGNOSIS — E1022 Type 1 diabetes mellitus with diabetic chronic kidney disease: Secondary | ICD-10-CM | POA: Diagnosis present

## 2022-01-08 DIAGNOSIS — Z91048 Other nonmedicinal substance allergy status: Secondary | ICD-10-CM

## 2022-01-08 DIAGNOSIS — E870 Hyperosmolality and hypernatremia: Secondary | ICD-10-CM | POA: Diagnosis present

## 2022-01-08 DIAGNOSIS — D62 Acute posthemorrhagic anemia: Secondary | ICD-10-CM | POA: Diagnosis not present

## 2022-01-08 DIAGNOSIS — N184 Chronic kidney disease, stage 4 (severe): Secondary | ICD-10-CM | POA: Diagnosis not present

## 2022-01-08 DIAGNOSIS — Z8673 Personal history of transient ischemic attack (TIA), and cerebral infarction without residual deficits: Secondary | ICD-10-CM

## 2022-01-08 DIAGNOSIS — E1069 Type 1 diabetes mellitus with other specified complication: Secondary | ICD-10-CM | POA: Diagnosis present

## 2022-01-08 DIAGNOSIS — R29708 NIHSS score 8: Secondary | ICD-10-CM | POA: Diagnosis present

## 2022-01-08 DIAGNOSIS — Z881 Allergy status to other antibiotic agents status: Secondary | ICD-10-CM

## 2022-01-08 DIAGNOSIS — I130 Hypertensive heart and chronic kidney disease with heart failure and stage 1 through stage 4 chronic kidney disease, or unspecified chronic kidney disease: Secondary | ICD-10-CM | POA: Diagnosis present

## 2022-01-08 DIAGNOSIS — Z23 Encounter for immunization: Secondary | ICD-10-CM | POA: Diagnosis not present

## 2022-01-08 DIAGNOSIS — Z720 Tobacco use: Secondary | ICD-10-CM | POA: Diagnosis not present

## 2022-01-08 DIAGNOSIS — Z7982 Long term (current) use of aspirin: Secondary | ICD-10-CM

## 2022-01-08 DIAGNOSIS — D6859 Other primary thrombophilia: Secondary | ICD-10-CM | POA: Diagnosis not present

## 2022-01-08 DIAGNOSIS — E11621 Type 2 diabetes mellitus with foot ulcer: Secondary | ICD-10-CM | POA: Diagnosis not present

## 2022-01-08 DIAGNOSIS — Z7189 Other specified counseling: Secondary | ICD-10-CM | POA: Diagnosis not present

## 2022-01-08 DIAGNOSIS — J441 Chronic obstructive pulmonary disease with (acute) exacerbation: Secondary | ICD-10-CM | POA: Diagnosis present

## 2022-01-08 DIAGNOSIS — T508X5A Adverse effect of diagnostic agents, initial encounter: Secondary | ICD-10-CM | POA: Diagnosis not present

## 2022-01-08 DIAGNOSIS — Z6834 Body mass index (BMI) 34.0-34.9, adult: Secondary | ICD-10-CM

## 2022-01-08 DIAGNOSIS — Z7901 Long term (current) use of anticoagulants: Secondary | ICD-10-CM

## 2022-01-08 DIAGNOSIS — Z9049 Acquired absence of other specified parts of digestive tract: Secondary | ICD-10-CM

## 2022-01-08 DIAGNOSIS — R6521 Severe sepsis with septic shock: Secondary | ICD-10-CM | POA: Diagnosis not present

## 2022-01-08 DIAGNOSIS — I951 Orthostatic hypotension: Secondary | ICD-10-CM | POA: Diagnosis not present

## 2022-01-08 DIAGNOSIS — I420 Dilated cardiomyopathy: Secondary | ICD-10-CM | POA: Diagnosis not present

## 2022-01-08 DIAGNOSIS — Z823 Family history of stroke: Secondary | ICD-10-CM

## 2022-01-08 DIAGNOSIS — Z6833 Body mass index (BMI) 33.0-33.9, adult: Secondary | ICD-10-CM

## 2022-01-08 DIAGNOSIS — Z83438 Family history of other disorder of lipoprotein metabolism and other lipidemia: Secondary | ICD-10-CM

## 2022-01-08 DIAGNOSIS — I253 Aneurysm of heart: Secondary | ICD-10-CM | POA: Diagnosis present

## 2022-01-08 DIAGNOSIS — Z888 Allergy status to other drugs, medicaments and biological substances status: Secondary | ICD-10-CM

## 2022-01-08 DIAGNOSIS — A419 Sepsis, unspecified organism: Secondary | ICD-10-CM | POA: Diagnosis not present

## 2022-01-08 DIAGNOSIS — E876 Hypokalemia: Secondary | ICD-10-CM | POA: Diagnosis not present

## 2022-01-08 DIAGNOSIS — B9789 Other viral agents as the cause of diseases classified elsewhere: Secondary | ICD-10-CM | POA: Diagnosis present

## 2022-01-08 DIAGNOSIS — H518 Other specified disorders of binocular movement: Secondary | ICD-10-CM | POA: Diagnosis present

## 2022-01-08 DIAGNOSIS — R451 Restlessness and agitation: Secondary | ICD-10-CM | POA: Diagnosis not present

## 2022-01-08 DIAGNOSIS — Z515 Encounter for palliative care: Secondary | ICD-10-CM | POA: Diagnosis not present

## 2022-01-08 DIAGNOSIS — Z22322 Carrier or suspected carrier of Methicillin resistant Staphylococcus aureus: Secondary | ICD-10-CM

## 2022-01-08 DIAGNOSIS — R482 Apraxia: Secondary | ICD-10-CM | POA: Diagnosis present

## 2022-01-08 DIAGNOSIS — I200 Unstable angina: Secondary | ICD-10-CM | POA: Diagnosis not present

## 2022-01-08 DIAGNOSIS — E1051 Type 1 diabetes mellitus with diabetic peripheral angiopathy without gangrene: Secondary | ICD-10-CM | POA: Diagnosis present

## 2022-01-08 DIAGNOSIS — Z794 Long term (current) use of insulin: Secondary | ICD-10-CM

## 2022-01-08 HISTORY — PX: IR INTRA CRAN STENT: IMG2345

## 2022-01-08 HISTORY — DX: Chronic kidney disease, stage 4 (severe): N18.4

## 2022-01-08 HISTORY — PX: RADIOLOGY WITH ANESTHESIA: SHX6223

## 2022-01-08 HISTORY — PX: IR PERCUTANEOUS ART THROMBECTOMY/INFUSION INTRACRANIAL INC DIAG ANGIO: IMG6087

## 2022-01-08 HISTORY — PX: IR CT HEAD LTD: IMG2386

## 2022-01-08 LAB — DIFFERENTIAL
Abs Immature Granulocytes: 0.03 10*3/uL (ref 0.00–0.07)
Basophils Absolute: 0 10*3/uL (ref 0.0–0.1)
Basophils Relative: 0 %
Eosinophils Absolute: 0.3 10*3/uL (ref 0.0–0.5)
Eosinophils Relative: 3 %
Immature Granulocytes: 0 %
Lymphocytes Relative: 18 %
Lymphs Abs: 1.8 10*3/uL (ref 0.7–4.0)
Monocytes Absolute: 0.6 10*3/uL (ref 0.1–1.0)
Monocytes Relative: 6 %
Neutro Abs: 7.3 10*3/uL (ref 1.7–7.7)
Neutrophils Relative %: 73 %

## 2022-01-08 LAB — COMPREHENSIVE METABOLIC PANEL
ALT: 14 U/L (ref 0–44)
ALT: 19 U/L (ref 0–44)
AST: 18 U/L (ref 15–41)
AST: 34 U/L (ref 15–41)
Albumin: 2.6 g/dL — ABNORMAL LOW (ref 3.5–5.0)
Albumin: 2.1 g/dL — ABNORMAL LOW (ref 3.5–5.0)
Alkaline Phosphatase: 73 U/L (ref 38–126)
Alkaline Phosphatase: 67 U/L (ref 38–126)
Anion gap: 10 (ref 5–15)
Anion gap: 8 (ref 5–15)
BUN: 26 mg/dL — ABNORMAL HIGH (ref 6–20)
BUN: 28 mg/dL — ABNORMAL HIGH (ref 6–20)
CO2: 17 mmol/L — ABNORMAL LOW (ref 22–32)
CO2: 14 mmol/L — ABNORMAL LOW (ref 22–32)
Calcium: 8.2 mg/dL — ABNORMAL LOW (ref 8.9–10.3)
Calcium: 7 mg/dL — ABNORMAL LOW (ref 8.9–10.3)
Chloride: 110 mmol/L (ref 98–111)
Chloride: 113 mmol/L — ABNORMAL HIGH (ref 98–111)
Creatinine, Ser: 1.94 mg/dL — ABNORMAL HIGH (ref 0.44–1.00)
Creatinine, Ser: 2.15 mg/dL — ABNORMAL HIGH (ref 0.44–1.00)
GFR, Estimated: 33 mL/min — ABNORMAL LOW (ref >60)
GFR, Estimated: 29 mL/min — ABNORMAL LOW (ref >60)
Glucose, Bld: 258 mg/dL — ABNORMAL HIGH (ref 70–99)
Glucose, Bld: 483 mg/dL — ABNORMAL HIGH (ref 70–99)
Potassium: 4.1 mmol/L (ref 3.5–5.1)
Potassium: 5.8 mmol/L — ABNORMAL HIGH (ref 3.5–5.1)
Sodium: 137 mmol/L (ref 135–145)
Sodium: 135 mmol/L (ref 135–145)
Total Bilirubin: 0.4 mg/dL (ref 0.3–1.2)
Total Bilirubin: 0.5 mg/dL (ref 0.3–1.2)
Total Protein: 5.6 g/dL — ABNORMAL LOW (ref 6.5–8.1)
Total Protein: 4.4 g/dL — ABNORMAL LOW (ref 6.5–8.1)

## 2022-01-08 LAB — CBG MONITORING, ED: Glucose-Capillary: 235 mg/dL — ABNORMAL HIGH (ref 70–99)

## 2022-01-08 LAB — MAGNESIUM: Magnesium: 1.9 mg/dL (ref 1.7–2.4)

## 2022-01-08 LAB — POCT I-STAT 7, (LYTES, BLD GAS, ICA,H+H)
Acid-base deficit: 13 mmol/L — ABNORMAL HIGH (ref 0.0–2.0)
Bicarbonate: 14.2 mmol/L — ABNORMAL LOW (ref 20.0–28.0)
Calcium, Ion: 1.03 mmol/L — ABNORMAL LOW (ref 1.15–1.40)
HCT: 17 % — ABNORMAL LOW (ref 36.0–46.0)
Hemoglobin: 5.8 g/dL — CL (ref 12.0–15.0)
O2 Saturation: 99 %
Patient temperature: 35.8
Potassium: 6.1 mmol/L — ABNORMAL HIGH (ref 3.5–5.1)
Sodium: 134 mmol/L — ABNORMAL LOW (ref 135–145)
TCO2: 15 mmol/L — ABNORMAL LOW (ref 22–32)
pCO2 arterial: 34.7 mmHg (ref 32–48)
pH, Arterial: 7.213 — ABNORMAL LOW (ref 7.35–7.45)
pO2, Arterial: 148 mmHg — ABNORMAL HIGH (ref 83–108)

## 2022-01-08 LAB — RESPIRATORY PANEL BY PCR

## 2022-01-08 LAB — ETHANOL: Alcohol, Ethyl (B): <10 mg/dL (ref <10)

## 2022-01-08 LAB — CBC
HCT: 23.9 % — ABNORMAL LOW (ref 36.0–46.0)
HCT: 17.9 % — ABNORMAL LOW (ref 36.0–46.0)
Hemoglobin: 7.6 g/dL — ABNORMAL LOW (ref 12.0–15.0)
Hemoglobin: 5.6 g/dL — CL (ref 12.0–15.0)
MCH: 29.2 pg (ref 26.0–34.0)
MCH: 28.9 pg (ref 26.0–34.0)
MCHC: 31.8 g/dL (ref 30.0–36.0)
MCHC: 31.3 g/dL (ref 30.0–36.0)
MCV: 91.9 fL (ref 80.0–100.0)
MCV: 92.3 fL (ref 80.0–100.0)
Platelets: 399 10*3/uL (ref 150–400)
Platelets: 431 10*3/uL — ABNORMAL HIGH (ref 150–400)
RBC: 2.6 MIL/uL — ABNORMAL LOW (ref 3.87–5.11)
RBC: 1.94 MIL/uL — ABNORMAL LOW (ref 3.87–5.11)
RDW: 12.9 % (ref 11.5–15.5)
RDW: 12.9 % (ref 11.5–15.5)
WBC: 10 10*3/uL (ref 4.0–10.5)
WBC: 13.8 10*3/uL — ABNORMAL HIGH (ref 4.0–10.5)
nRBC: 0 % (ref 0.0–0.2)
nRBC: 0 % (ref 0.0–0.2)

## 2022-01-08 LAB — GLUCOSE, CAPILLARY
Glucose-Capillary: 427 mg/dL — ABNORMAL HIGH (ref 70–99)
Glucose-Capillary: 461 mg/dL — ABNORMAL HIGH (ref 70–99)
Glucose-Capillary: 464 mg/dL — ABNORMAL HIGH (ref 70–99)
Glucose-Capillary: 481 mg/dL — ABNORMAL HIGH (ref 70–99)
Glucose-Capillary: 484 mg/dL — ABNORMAL HIGH (ref 70–99)

## 2022-01-08 LAB — I-STAT CHEM 8, ED
BUN: 24 mg/dL — ABNORMAL HIGH (ref 6–20)
Calcium, Ion: 1.12 mmol/L — ABNORMAL LOW (ref 1.15–1.40)
Chloride: 111 mmol/L (ref 98–111)
Creatinine, Ser: 2 mg/dL — ABNORMAL HIGH (ref 0.44–1.00)
Glucose, Bld: 252 mg/dL — ABNORMAL HIGH (ref 70–99)
HCT: 22 % — ABNORMAL LOW (ref 36.0–46.0)
Hemoglobin: 7.5 g/dL — ABNORMAL LOW (ref 12.0–15.0)
Potassium: 4.3 mmol/L (ref 3.5–5.1)
Sodium: 138 mmol/L (ref 135–145)
TCO2: 19 mmol/L — ABNORMAL LOW (ref 22–32)

## 2022-01-08 LAB — PROTIME-INR
INR: 1.5 — ABNORMAL HIGH (ref 0.8–1.2)
Prothrombin Time: 17.8 seconds — ABNORMAL HIGH (ref 11.4–15.2)

## 2022-01-08 LAB — RESP PANEL BY RT-PCR (FLU A&B, COVID) ARPGX2
Influenza A by PCR: NEGATIVE
Influenza B by PCR: NEGATIVE
SARS Coronavirus 2 by RT PCR: NEGATIVE

## 2022-01-08 LAB — I-STAT BETA HCG BLOOD, ED (MC, WL, AP ONLY): I-stat hCG, quantitative: <5 m[IU]/mL (ref <5)

## 2022-01-08 LAB — APTT: aPTT: 36 seconds (ref 24–36)

## 2022-01-08 LAB — HEPARIN LEVEL (UNFRACTIONATED): Heparin Unfractionated: >1.1 IU/mL — ABNORMAL HIGH (ref 0.30–0.70)

## 2022-01-08 LAB — PREPARE RBC (CROSSMATCH)

## 2022-01-08 LAB — TROPONIN I (HIGH SENSITIVITY)
Troponin I (High Sensitivity): 1304 ng/L (ref <18)
Troponin I (High Sensitivity): 860 ng/L (ref <18)

## 2022-01-08 LAB — PHOSPHORUS: Phosphorus: 5 mg/dL — ABNORMAL HIGH (ref 2.5–4.6)

## 2022-01-08 SURGERY — IR WITH ANESTHESIA
Anesthesia: General

## 2022-01-08 MED ORDER — FENTANYL CITRATE (PF) 100 MCG/2ML IJ SOLN
INTRAMUSCULAR | Status: AC
  Filled 2022-01-08: qty 2

## 2022-01-08 MED ORDER — NITROGLYCERIN 1 MG/10 ML FOR IR/CATH LAB
INTRA_ARTERIAL | Status: AC
  Filled 2022-01-08: qty 10

## 2022-01-08 MED ORDER — ACETAMINOPHEN 650 MG RE SUPP: 650.0000 mg | RECTAL | Status: DC | PRN

## 2022-01-08 MED ORDER — CANGRELOR BOLUS VIA INFUSION
INTRAVENOUS | Status: AC | PRN
  Administered 2022-01-08: 1365 ug via INTRAVENOUS

## 2022-01-08 MED ORDER — ASPIRIN 81 MG PO CHEW
81.0000 mg | CHEWABLE_TABLET | Freq: Every day | ORAL | Status: DC
  Administered 2022-01-09 – 2022-01-16 (×7): 81 mg
  Filled 2022-01-08 (×7): qty 1

## 2022-01-08 MED ORDER — SUCCINYLCHOLINE CHLORIDE 200 MG/10ML IV SOSY
PREFILLED_SYRINGE | INTRAVENOUS | Status: DC | PRN
  Administered 2022-01-08: 100 mg via INTRAVENOUS

## 2022-01-08 MED ORDER — IOHEXOL 300 MG/ML  SOLN
100.0000 mL | Freq: Once | INTRAMUSCULAR | Status: AC | PRN
Start: 2022-01-08 — End: 2022-01-08
  Administered 2022-01-08: 96 mL via INTRA_ARTERIAL

## 2022-01-08 MED ORDER — ACETAMINOPHEN 325 MG PO TABS
650.0000 mg | ORAL_TABLET | ORAL | Status: DC | PRN
Start: 2022-01-08 — End: 2022-01-28
  Administered 2022-01-20 – 2022-01-27 (×3): 650 mg via ORAL
  Filled 2022-01-08 (×3): qty 2

## 2022-01-08 MED ORDER — ROCURONIUM BROMIDE 10 MG/ML (PF) SYRINGE
PREFILLED_SYRINGE | INTRAVENOUS | Status: DC | PRN
  Administered 2022-01-08 (×3): 20 mg via INTRAVENOUS
  Administered 2022-01-08: 60 mg via INTRAVENOUS

## 2022-01-08 MED ORDER — TICAGRELOR 90 MG PO TABS
ORAL_TABLET | ORAL | Status: AC
  Filled 2022-01-08: qty 1

## 2022-01-08 MED ORDER — TICAGRELOR 90 MG PO TABS
90.0000 mg | ORAL_TABLET | Freq: Two times a day (BID) | ORAL | Status: DC
  Administered 2022-01-09 – 2022-01-27 (×33): 90 mg
  Filled 2022-01-08 (×33): qty 1

## 2022-01-08 MED ORDER — PANTOPRAZOLE SODIUM 40 MG IV SOLR
40.0000 mg | Freq: Every day | INTRAVENOUS | Status: DC
  Administered 2022-01-08 – 2022-01-11 (×4): 40 mg via INTRAVENOUS
  Filled 2022-01-08 (×4): qty 10

## 2022-01-08 MED ORDER — CLEVIDIPINE BUTYRATE 0.5 MG/ML IV EMUL: 0.0000 mg/h | INTRAVENOUS | Status: AC

## 2022-01-08 MED ORDER — FENTANYL CITRATE (PF) 100 MCG/2ML IJ SOLN
INTRAMUSCULAR | Status: DC | PRN
  Administered 2022-01-08 (×2): 50 ug via INTRAVENOUS
  Administered 2022-01-08: 100 ug via INTRAVENOUS

## 2022-01-08 MED ORDER — DEXTROSE 50 % IV SOLN: 0.0000 mL | INTRAVENOUS | Status: DC | PRN

## 2022-01-08 MED ORDER — SODIUM CHLORIDE 0.9% IV SOLUTION: Freq: Once | INTRAVENOUS | Status: AC

## 2022-01-08 MED ORDER — IOHEXOL 300 MG/ML  SOLN
100.0000 mL | Freq: Once | INTRAMUSCULAR | Status: AC | PRN
  Administered 2022-01-08: 96 mL via INTRA_ARTERIAL

## 2022-01-08 MED ORDER — TICAGRELOR 60 MG PO TABS
ORAL_TABLET | ORAL | Status: AC | PRN
  Administered 2022-01-08: 180 mg

## 2022-01-08 MED ORDER — CHLORHEXIDINE GLUCONATE CLOTH 2 % EX PADS: 6.0000 | MEDICATED_PAD | Freq: Every day | CUTANEOUS | Status: DC

## 2022-01-08 MED ORDER — PHENYLEPHRINE HCL-NACL 20-0.9 MG/250ML-% IV SOLN
INTRAVENOUS | Status: DC | PRN
  Administered 2022-01-08: 25 ug/min via INTRAVENOUS

## 2022-01-08 MED ORDER — SODIUM CHLORIDE 0.9 % IV SOLN: INTRAVENOUS | Status: DC | PRN

## 2022-01-08 MED ORDER — INSULIN ASPART 100 UNIT/ML IJ SOLN: 0.0000 [IU] | INTRAMUSCULAR | Status: DC

## 2022-01-08 MED ORDER — POLYETHYLENE GLYCOL 3350 17 G PO PACK
17.0000 g | PACK | Freq: Every day | ORAL | Status: DC
  Administered 2022-01-14: 17 g
  Filled 2022-01-08 (×3): qty 1

## 2022-01-08 MED ORDER — CEFAZOLIN SODIUM-DEXTROSE 2-4 GM/100ML-% IV SOLN
INTRAVENOUS | Status: AC
  Filled 2022-01-08: qty 100

## 2022-01-08 MED ORDER — ASPIRIN 81 MG PO CHEW: 81.0000 mg | CHEWABLE_TABLET | Freq: Every day | ORAL | Status: DC

## 2022-01-08 MED ORDER — EPTIFIBATIDE 20 MG/10ML IV SOLN: INTRAVENOUS | Status: AC | PRN

## 2022-01-08 MED ORDER — PHENYLEPHRINE HCL-NACL 20-0.9 MG/250ML-% IV SOLN: 25.0000 ug/min | INTRAVENOUS | Status: DC

## 2022-01-08 MED ORDER — ORAL CARE MOUTH RINSE: 15.0000 mL | OROMUCOSAL | Status: DC | PRN

## 2022-01-08 MED ORDER — CANGRELOR TETRASODIUM 50 MG IV SOLR
INTRAVENOUS | Status: AC
  Filled 2022-01-08: qty 50

## 2022-01-08 MED ORDER — PHENYLEPHRINE HCL-NACL 20-0.9 MG/250ML-% IV SOLN: 0.0000 ug/min | INTRAVENOUS | Status: DC

## 2022-01-08 MED ORDER — DOCUSATE SODIUM 100 MG PO CAPS: 100.0000 mg | ORAL_CAPSULE | Freq: Two times a day (BID) | ORAL | Status: DC | PRN

## 2022-01-08 MED ORDER — CEFAZOLIN SODIUM-DEXTROSE 2-3 GM-%(50ML) IV SOLR
INTRAVENOUS | Status: DC | PRN
  Administered 2022-01-08: 2 g via INTRAVENOUS

## 2022-01-08 MED ORDER — NOREPINEPHRINE 16 MG/250ML-% IV SOLN
0.0000 ug/min | INTRAVENOUS | Status: DC
  Administered 2022-01-08: 20 ug/min via INTRAVENOUS
  Filled 2022-01-08: qty 250

## 2022-01-08 MED ORDER — LEVOTHYROXINE SODIUM 75 MCG PO TABS: 75.0000 ug | ORAL_TABLET | Freq: Every day | ORAL | Status: DC

## 2022-01-08 MED ORDER — FENTANYL CITRATE PF 50 MCG/ML IJ SOSY
50.0000 ug | PREFILLED_SYRINGE | Freq: Once | INTRAMUSCULAR | Status: AC
  Administered 2022-01-08: 50 ug via INTRAVENOUS

## 2022-01-08 MED ORDER — DOCUSATE SODIUM 50 MG/5ML PO LIQD
100.0000 mg | Freq: Two times a day (BID) | ORAL | Status: DC
  Administered 2022-01-12 – 2022-01-27 (×21): 100 mg
  Filled 2022-01-08 (×24): qty 10

## 2022-01-08 MED ORDER — PHENYLEPHRINE 80 MCG/ML (10ML) SYRINGE FOR IV PUSH (FOR BLOOD PRESSURE SUPPORT)
PREFILLED_SYRINGE | INTRAVENOUS | Status: DC | PRN
  Administered 2022-01-08 (×5): 80 ug via INTRAVENOUS

## 2022-01-08 MED ORDER — PROPOFOL 10 MG/ML IV BOLUS
INTRAVENOUS | Status: DC | PRN
  Administered 2022-01-08: 130 mg via INTRAVENOUS
  Administered 2022-01-08: 30 ug/kg/min via INTRAVENOUS

## 2022-01-08 MED ORDER — ACETAMINOPHEN 160 MG/5ML PO SOLN: 650.0000 mg | ORAL | Status: DC | PRN

## 2022-01-08 MED ORDER — ACETAMINOPHEN 325 MG PO TABS: 650.0000 mg | ORAL_TABLET | ORAL | Status: DC | PRN

## 2022-01-08 MED ORDER — ASPIRIN 81 MG PO CHEW
CHEWABLE_TABLET | ORAL | Status: AC
  Filled 2022-01-08: qty 1

## 2022-01-08 MED ORDER — SUGAMMADEX SODIUM 200 MG/2ML IV SOLN
INTRAVENOUS | Status: DC | PRN
  Administered 2022-01-08: 200 mg via INTRAVENOUS

## 2022-01-08 MED ORDER — LABETALOL HCL 5 MG/ML IV SOLN
10.0000 mg | INTRAVENOUS | Status: DC | PRN
  Administered 2022-01-12: 10 mg via INTRAVENOUS
  Filled 2022-01-08: qty 4

## 2022-01-08 MED ORDER — SODIUM CHLORIDE (PF) 0.9 % IJ SOLN
INTRAVENOUS | Status: AC | PRN
  Administered 2022-01-08 (×3): 25 ug via INTRA_ARTERIAL

## 2022-01-08 MED ORDER — IOHEXOL 350 MG/ML SOLN
40.0000 mL | Freq: Once | INTRAVENOUS | Status: AC | PRN
  Administered 2022-01-08: 40 mL via INTRAVENOUS

## 2022-01-08 MED ORDER — SODIUM CHLORIDE 0.9 % IV SOLN: INTRAVENOUS | Status: DC

## 2022-01-08 MED ORDER — ACETAMINOPHEN 160 MG/5ML PO SOLN
650.0000 mg | ORAL | Status: DC | PRN
  Administered 2022-01-12 – 2022-01-25 (×14): 650 mg
  Filled 2022-01-08 (×15): qty 20.3

## 2022-01-08 MED ORDER — INSULIN REGULAR(HUMAN) IN NACL 100-0.9 UT/100ML-% IV SOLN
INTRAVENOUS | Status: DC
  Administered 2022-01-08: 8 [IU]/h via INTRAVENOUS
  Administered 2022-01-09: 6.5 [IU]/h via INTRAVENOUS
  Filled 2022-01-08 (×2): qty 100

## 2022-01-08 MED ORDER — PROPOFOL 1000 MG/100ML IV EMUL
0.0000 ug/kg/min | INTRAVENOUS | Status: DC
  Administered 2022-01-08: 30 ug/kg/min via INTRAVENOUS
  Administered 2022-01-09: 35 ug/kg/min via INTRAVENOUS
  Administered 2022-01-09: 25 ug/kg/min via INTRAVENOUS
  Administered 2022-01-09: 10 ug/kg/min via INTRAVENOUS
  Administered 2022-01-10: 20 ug/kg/min via INTRAVENOUS
  Filled 2022-01-08 (×5): qty 100

## 2022-01-08 MED ORDER — NOREPINEPHRINE 4 MG/250ML-% IV SOLN: 0.0000 ug/min | INTRAVENOUS | Status: DC

## 2022-01-08 MED ORDER — SODIUM CHLORIDE 0.9 % IV SOLN
INTRAVENOUS | Status: AC | PRN
  Administered 2022-01-08: 2 ug/kg/min via INTRAVENOUS

## 2022-01-08 MED ORDER — TICAGRELOR 90 MG PO TABS
90.0000 mg | ORAL_TABLET | Freq: Two times a day (BID) | ORAL | Status: DC
  Administered 2022-01-21 – 2022-01-28 (×5): 90 mg via ORAL
  Filled 2022-01-08 (×12): qty 1

## 2022-01-08 MED ORDER — STROKE: EARLY STAGES OF RECOVERY BOOK
Freq: Once | Status: AC
  Filled 2022-01-08: qty 1

## 2022-01-08 MED ORDER — PHENYLEPHRINE HCL-NACL 20-0.9 MG/250ML-% IV SOLN
0.0000 ug/min | INTRAVENOUS | Status: DC
  Administered 2022-01-08: 25 ug/min via INTRAVENOUS
  Filled 2022-01-08: qty 250

## 2022-01-08 MED ORDER — SODIUM CHLORIDE 0.9 % IV SOLN: 250.0000 mL | INTRAVENOUS | Status: DC

## 2022-01-08 MED ORDER — IOHEXOL 350 MG/ML SOLN
50.0000 mL | Freq: Once | INTRAVENOUS | Status: AC | PRN
  Administered 2022-01-08: 50 mL via INTRAVENOUS

## 2022-01-08 MED ORDER — FENTANYL 2500MCG IN NS 250ML (10MCG/ML) PREMIX INFUSION
50.0000 ug/h | INTRAVENOUS | Status: DC
  Administered 2022-01-08: 50 ug/h via INTRAVENOUS
  Filled 2022-01-08: qty 250

## 2022-01-08 MED ORDER — ATORVASTATIN CALCIUM 40 MG PO TABS
40.0000 mg | ORAL_TABLET | Freq: Every day | ORAL | Status: DC
  Administered 2022-01-09 – 2022-01-28 (×18): 40 mg
  Filled 2022-01-08 (×18): qty 1

## 2022-01-08 MED ORDER — ASPIRIN 325 MG PO TABS
ORAL_TABLET | ORAL | Status: AC
  Filled 2022-01-08: qty 1

## 2022-01-08 MED ORDER — ASPIRIN 325 MG PO TABS
ORAL_TABLET | ORAL | Status: AC | PRN
  Administered 2022-01-08: 81 mg

## 2022-01-08 MED ORDER — MIDAZOLAM HCL 2 MG/2ML IJ SOLN: 2.0000 mg | INTRAMUSCULAR | Status: DC | PRN

## 2022-01-08 MED ORDER — POLYETHYLENE GLYCOL 3350 17 G PO PACK: 17.0000 g | PACK | Freq: Every day | ORAL | Status: DC | PRN

## 2022-01-08 MED ORDER — LIDOCAINE 2% (20 MG/ML) 5 ML SYRINGE
INTRAMUSCULAR | Status: DC | PRN
  Administered 2022-01-08: 80 mg via INTRAVENOUS

## 2022-01-08 MED ORDER — FENTANYL BOLUS VIA INFUSION
50.0000 ug | INTRAVENOUS | Status: DC | PRN
  Administered 2022-01-08 – 2022-01-09 (×2): 50 ug via INTRAVENOUS

## 2022-01-08 MED ORDER — MIDAZOLAM HCL 2 MG/2ML IJ SOLN
2.0000 mg | INTRAMUSCULAR | Status: DC | PRN
  Administered 2022-01-09: 2 mg via INTRAVENOUS
  Filled 2022-01-08: qty 2

## 2022-01-08 MED ORDER — LEVOTHYROXINE SODIUM 100 MCG PO TABS
175.0000 ug | ORAL_TABLET | Freq: Every day | ORAL | Status: DC
Start: 2022-01-09 — End: 2022-01-08

## 2022-01-08 MED ORDER — SENNOSIDES-DOCUSATE SODIUM 8.6-50 MG PO TABS: 1.0000 | ORAL_TABLET | Freq: Every evening | ORAL | Status: DC | PRN

## 2022-01-08 MED ORDER — ORAL CARE MOUTH RINSE
15.0000 mL | OROMUCOSAL | Status: DC
  Administered 2022-01-08 – 2022-01-10 (×23): 15 mL via OROMUCOSAL

## 2022-01-08 NOTE — Anesthesia Preprocedure Evaluation (Signed)
Anesthesia Evaluation  Patient identified by MRN, date of birth, ID band Patient awake    Reviewed: Unable to perform ROS - Chart review onlyPreop documentation limited or incomplete due to emergent nature of procedure.  Airway Mallampati: II  TM Distance: >3 FB Neck ROM: Full    Dental no notable dental hx. (+) Teeth Intact, Dental Advisory Given   Pulmonary former smoker,    Pulmonary exam normal breath sounds clear to auscultation       Cardiovascular hypertension, Normal cardiovascular exam Rhythm:Regular Rate:Normal     Neuro/Psych    GI/Hepatic   Endo/Other  diabetes  Renal/GU      Musculoskeletal   Abdominal   Peds  Hematology   Anesthesia Other Findings   Reproductive/Obstetrics                             Anesthesia Physical Anesthesia Plan  ASA: 3 and emergent  Anesthesia Plan: General   Post-op Pain Management: Minimal or no pain anticipated and Regional block*   Induction: Intravenous, Cricoid pressure planned and Rapid sequence  PONV Risk Score and Plan: Treatment may vary due to age or medical condition  Airway Management Planned: Oral ETT  Additional Equipment: Arterial line  Intra-op Plan:   Post-operative Plan: Possible Post-op intubation/ventilation  Informed Consent: I have reviewed the patients History and Physical, chart, labs and discussed the procedure including the risks, benefits and alternatives for the proposed anesthesia with the patient or authorized representative who has indicated his/her understanding and acceptance.     Only emergency history available  Plan Discussed with:   Anesthesia Plan Comments:         Anesthesia Quick Evaluation

## 2022-01-08 NOTE — Anesthesia Postprocedure Evaluation (Signed)
Anesthesia Post Note  Patient: Lauren [name]  Procedure(s) Performed: IR WITH ANESTHESIA     Patient location during evaluation: SICU Anesthesia Type: General Level of consciousness: sedated Pain management: pain level controlled Vital Signs Assessment: post-procedure vital signs reviewed and stable Respiratory status: patient remains intubated per anesthesia plan Cardiovascular status: stable Postop Assessment: no apparent nausea or vomiting Anesthetic complications: no   No notable events documented.  Last Vitals:  Vitals:   01/08/22 1840 01/08/22 1854  BP:    Pulse: 69 69  Resp: 18 (!) 26  Temp:  (!) 35.8 C  SpO2: 100% 100%    Last Pain:  Vitals:   01/08/22 1854  TempSrc: Axillary  PainSc:                  Lauren [name] Lauren [name]

## 2022-01-08 NOTE — Procedures (Signed)
Central Venous Catheter Insertion Procedure Note  Lauren [name]  [id number]  14-May-1980  Date:01/08/22  Time:8:11 PM   Provider Performing:Dakiyah Heinke D Rollene Rotunda   Procedure: Insertion of Non-tunneled Central Venous [phone]) with US guidance ([id number])      Indication(s) Medication administration  Consent Risks of the procedure as well as the alternatives and risks of each were explained to the patient and/or caregiver.  Consent for the procedure was obtained and is signed in the bedside chart  Anesthesia Topical only with 1% lidocaine   Timeout Verified patient identification, verified procedure, site/side was marked, verified correct patient position, special equipment/implants available, medications/allergies/relevant history reviewed, required imaging and test results available.  Sterile Technique Maximal sterile technique including full sterile barrier drape, hand hygiene, sterile gown, sterile gloves, mask, hair covering, sterile ultrasound probe cover (if used).  Procedure Description Area of catheter insertion was cleaned with chlorhexidine and draped in sterile fashion.  With real-time ultrasound guidance a central venous catheter was placed into the left internal jugular vein. Nonpulsatile blood flow and easy flushing noted in all ports.  The catheter was sutured in place and sterile dressing applied.  Complications/Tolerance None; patient tolerated the procedure well. Chest X-ray is ordered to verify placement for internal jugular or subclavian cannulation.   Chest x-ray is not ordered for femoral cannulation.  EBL Minimal  Specimen(s) None  JD Rexene Agent [facility] 01/08/2022, 8:12 PM  Please see [email or web address] for pager details.  From 7A-7P if no response, please call [phone]. After hours, please call ELink [phone].

## 2022-01-08 NOTE — Progress Notes (Signed)
Pt transported to CT scan with transport and RN at bedside. No complications at this time. RT will continue to monitor.

## 2022-01-08 NOTE — Anesthesia Procedure Notes (Signed)
Procedure Name: Intubation Date/Time: 01/08/2022 2:14 PM  Performed by: Colin Benton, CRNAPre-anesthesia Checklist: Patient identified, Emergency Drugs available, Suction available and Patient being monitored Patient Re-evaluated:Patient Re-evaluated prior to induction Oxygen Delivery Method: Circle system utilized Preoxygenation: Pre-oxygenation with 100% oxygen Induction Type: IV induction and Rapid sequence Laryngoscope Size: Mac and 4 Grade View: Grade II Tube type: Oral Tube size: 7.0 mm Number of attempts: 1 Airway Equipment and Method: Stylet Placement Confirmation: ETT inserted through vocal cords under direct vision, positive ETCO2 and breath sounds checked- equal and bilateral Secured at: 21 cm Tube secured with: Tape Dental Injury: Teeth and Oropharynx as per pre-operative assessment

## 2022-01-08 NOTE — Progress Notes (Signed)
eLink Physician-Brief Progress Note Patient Name: Lauren [name] DOB: Jan 17, 1981 MRN: [id number]   Date of Service  01/08/2022  HPI/Events of Note  Multiple issues: 1. Review of CXR reveals ETT tip  just above the carina. Recommend retraction by 3 cm for optimal positioning. 2. ABG on = 7.213/34.7/148/14.2. 3. EKG reveals NSR, R atrial enlargement, possible inferior infarct, age undetermined and possible anterior infarct, age undetermined. Troponin = 1304 --> 860. It appears the Troponin has peaked and is now decreasing. Demand ischemia? Cardiac echo is already ordered to assess LVEF and wall motion. Already on ASA. Patient was on Eliquis at home. Now on Brilinta s/p CNS stent placement.   eICU Interventions  Plan: Withdraw ETT 3 cm and re-secure. Repeat portable CXR after repositioning of ETT. Increase PRVC rate to 25. Repeat ABG at 1:30 AM.     Intervention Category Major Interventions: Respiratory failure - evaluation and management  Raia Amico Eugene 01/08/2022, 11:41 PM

## 2022-01-08 NOTE — H&P (Signed)
Neurology H&P  CC: chest pain, headache, blurred vision  History is obtained from: Patient, husband and chart review   HPI: Lauren [name] is a 41 y.o. 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  He notes that their son had a cough/fever Saturday Sunday and Monday and that she felt bad starting Wednesday evening and has had some fevers as well as cough.  She presented with ischemic right upper extremity on 11/18/2021 and underwent a right brachial, radial and ulnar artery thrombectomy.  Transthoracic echocardiogram was negative for vegetation and there is no evidence of atrial fibrillation or other arrhythmia, although she was noted to have left atrial enlargement.  She was started on Eliquis which she has been adherent to per family.  They note that she has been having heavy bleeding since starting this medication with her monthly cycles, but otherwise no other significant issues with bleeding  On 8/28 podiatry evaluation MRI was recommended to evaluate for contiguous residual osteomyelitis with plan for potential angiography as well due to poor healing, and per 9/5 telephone note fourth metatarsal head resection and proximal phalangeal resection will likely be necessary and is planned after possible revascularization  On evaluation patient is noted to have neglect of her left-sided deficits  LKW: Unclear due to  patient's neglect and family's report of some generalized weakness/fatigue Thrombolytic given?: No, due to unclear last known well and on Eliquis IA performed?: Yes Premorbid modified rankin scale:      2 - Slight disability. Able to look after own affairs without assistance, but unable to carry out all previous activities.  ROS: Pertinent review of systems reviewed within a compressed timeframe for emergent intervention as above  Past Medical History:  Diagnosis Date   Anemia    CAD (coronary artery disease)    a. s/p cath in 03/2014 showing 30% mid-LAD, moderate to severe disease along small D1, patent LCx, moderate to severe distal OM2 stenosis and moderate diffuse diease along RCA not amenable to PCI   CHF (congestive heart failure) ([place])    a. EF 55-60% in 12/2019 b. EF at 35-40% by echo in 05/2020   Diabetes mellitus without complication ([place])    Myocardial infarction ([place])    Neuropathy    Renal disorder    stage 3 kidney disease   Stroke [facility])    Past Surgical History:  Procedure Laterality Date   AMPUTATION Left 09/02/2021   Procedure: AMPUTATION RAY;  Surgeon: Criselda Peaches, DPM;  Location: [place];  Service: Podiatry;  Laterality: Left;  sagittal saw, 3L bag saline & Pulse   Cardiac catherization     CHOLECYSTECTOMY     I & D EXTREMITY Left 08/30/2021   Procedure: IRRIGATION AND DEBRIDEMENT WITH BONE BIOPSY;  Surgeon: [name], DPM;  Location: [place];  Service: Podiatry;  Laterality: Left;   IRRIGATION AND DEBRIDEMENT FOOT Left 09/04/2021   Procedure: IRRIGATION AND DEBRIDEMENT FOOT AND CLOSURE;  Surgeon: Criselda Peaches, DPM;  Location: [place];  Service: Podiatry;  Laterality: Left;   THROMBECTOMY BRACHIAL ARTERY Right 11/18/2021   Procedure: RIGHT BRACHIAL, RADIAL, & ULNAR ARTERY THROMBECTOMY.;  Surgeon: Marty Heck, MD;  Location: [facility];  Service: Vascular;  Laterality: Right;   TUBAL LIGATION       Family History  Problem Relation Age of Onset   Thyroid  disease Mother    Hypertension Mother    Hyperlipidemia Mother    CAD Mother    CVA Mother    Hyperlipidemia Father    Hypertension Father    CAD Father    Breast cancer Paternal Grandmother 43   Cancer Paternal Grandmother        lung     Social History:  reports that she quit smoking about 14 months ago. Her smoking use included cigarettes. She smoked an average of .25 packs per day. She has never used smokeless tobacco. She reports that she does not drink alcohol and does not use drugs.   Exam: Current vital signs: BP (!) 142/91 (BP Location: Left Arm)   Pulse 73   Temp 97.9 F (36.6 C)   Resp 16   Ht 5\' 5"  (1.651 m)   Wt 91 kg   LMP 11/01/2021 (Approximate)   SpO2 100%   BMI 33.38 kg/m  Vital signs in last 24 hours: Temp:  [97.9 F (36.6 C)] 97.9 F (36.6 C) (09/08 1318) Pulse Rate:  [73] 73 (09/08 1318) Resp:  [16] 16 (09/08 1318) BP: (142)/(91) 142/91 (09/08 1318) SpO2:  [100 %] 100 % (09/08 1318) Weight:  [91 kg] 91 kg (09/08 1329)   Physical Exam  Constitutional: Appears chronically ill Psych: Affect highly anxious but cooperative Eyes: No scleral injection HENT: No oropharyngeal obstruction.  MSK: Left foot in boot Cardiovascular: Shortness of breath when laying flat on scanner Respiratory: Tachypneic GI: Soft.  No distension.   Neuro: Mental Status: Patient is awake, alert, oriented to person, place, month, and age Patient is able to give a some history but highly anxious and having difficulty with breathing Left-sided neglect but no aphasia Cranial Nerves: II: Visual Fields are notable for left lower quadrant field cut. III,IV, VI: EOMI without ptosis or diploplia V: Facial sensation with baseline left chin paresthesias  VII: Facial movement is notable for a left facial droop VIII: hearing is intact to voice Motor: Fluctuating weakness greater on the left than the right side, at best she had no drift on the right side Sensory: Sensation is  symmetric to light touch in all 4 extremities Cerebellar: FNF and HKS are intact bilaterally Gait:  Deferred   NIHSS total 4-9  Performed at 13:35   I have reviewed labs in epic and the results pertinent to this consultation are:  Basic Metabolic Panel: Recent Labs  Lab 01/08/22 1345  NA 138  K 4.3  CL 111  GLUCOSE 252*  BUN 24*  CREATININE 2.00*    CBC: Recent Labs  Lab 01/08/22 1326 01/08/22 1345  WBC 10.0  --   NEUTROABS 7.3  --   HGB 7.6* 7.5*  HCT 23.9* 22.0*  MCV 91.9  --   PLT 399  --     Coagulation Studies: Recent Labs    01/08/22 1326  LABPROT 17.8*  INR 1.5*    Lab Results  Component Value Date   CHOL 100 06/13/2020   HDL 23 (L) 06/13/2020   LDLCALC 55 06/13/2020   LDLDIRECT 84.9 01/01/2020  TRIG 109 06/13/2020   CHOLHDL 4.3 06/13/2020   Lab Results  Component Value Date   HGBA1C 9.6 09/29/2021     I have reviewed the images obtained:   Head CT personally reviewed, agree with radiology 1. Periventricular white matter hypodensities adjacent to the frontal horn of the right lateral ventricles and in the right corona radiata are new since prior exam. This may represent acute ischemia. 2. No acute or focal cortical abnormality. 3. ASPECTS is 10/10.  CTA and CT perfusion personally reviewed agree with radiology 1. 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 Right MCA and A1 ACA are patent, but diminutive. 2. Small/non dominant right vertebral artery, which is irregular throughout its V2 segment with age indeterminate occlusion at the V3 segment with non opacification of the proximal intradural vertebral artery and reconstitution of the distal intradural vertebral artery. 3. Severe left paraclinoid ICA stenosis. 4. One CT perfusion, no evidence of core infarct or penumbra; however, there is approximately 38 mL of T-max greater than 4 seconds  in the right MCA territory which suggests possible oligemia. An MRI could provide more sensitive evaluation for acute infarct. 5. Moderate layering bilateral pleural effusions and peribronchial wall thickening. Recommend dedicated chest imaging.   ECHO 11/19/2021  1. Left ventricular ejection fraction, by estimation, is 40 to 45%. The  left ventricle has mildly decreased function. The left ventricle  demonstrates regional wall motion abnormalities (see scoring  diagram/findings for description). There is moderate  concentric left ventricular hypertrophy. Left ventricular diastolic  parameters are consistent with Grade II diastolic dysfunction  (pseudonormalization). Elevated left ventricular end-diastolic pressure.   2. Right ventricular systolic function is normal. The right ventricular  size is normal. There is normal pulmonary artery systolic pressure.   3. Left atrial size was mildly dilated.   4. The mitral valve is normal in structure. Trivial mitral valve  regurgitation. No evidence of mitral stenosis.   5. The aortic valve is tricuspid. Aortic valve regurgitation is not  visualized. No aortic stenosis is present.   6. The inferior vena cava is normal in size with greater than 50%  respiratory variability, suggesting right atrial pressure of 3 mmHg.   FINDINGS   Left Ventricle: Left ventricular ejection fraction, by estimation, is 40  to 45%. The left ventricle has mildly decreased function. The left  ventricle demonstrates regional wall motion abnormalities. Definity  contrast agent was given IV to delineate the  left ventricular endocardial borders. The left ventricular internal cavity  size was normal in size. There is moderate concentric left ventricular  hypertrophy. Left ventricular diastolic parameters are consistent with  Grade II diastolic dysfunction  (pseudonormalization). Elevated left ventricular end-diastolic pressure.   9/2 MRI left foot Osteomyelitis of the  distal fourth metatarsal head and likely early osteomyelitis of the adjacent proximal phalanx. Trace fourth MTP joint effusion suggesting developing septic arthritis. Adjacent soft tissue swelling without evidence of soft tissue abscess on noncontrast MRI.   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    Impression: Central embolic events of unclear etiology at this time, high concern for cardio embolic process given clinical picture as described above.  Given fever would also consider endocarditis although she does have a respiratory infection based on imaging as well as history.  However also concern for osteomyelitis.  Discussed  with neuro interventional radiology as well as neuroradiology that her perfusion imaging interestingly did not show clear tissue at risk.  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 - Stroke labs HgbA1c, fasting lipid panel - MRI brain MRA of the brain without contrast - Frequent neuro checks - Echocardiogram with bubble study and TCD with bubble study - Antiplatelets per neuro IR - Risk factor modification - Telemetry monitoring; 30 day event monitor on discharge if no arrythmias captured versus loop recorder - Blood pressure goal   - Post successful uncomplicated revascularization SBP 120 - 140 for 24 hours; if complications have arisen or only partial revascularization reach out to interventionalist or neurologist on call for BP goal - PT consult, OT consult, Speech consult, unless patient is back to baseline - Admitted to stroke team  # Diabetes,  # nonhealing left foot diabetic ulcer with concern for osteomyelitis,  # acute  respiratory dysfunction,  # recent critical limb ischemia, -Appreciate CCM comanagement  Lesleigh Noe MD-PhD [facility] [phone] Available 7 AM to 7 PM, outside these hours please contact Neurologist on call listed on AMION    Total critical care time: 60 minutes   Critical care time was exclusive of separately billable procedures and treating other patients.   Critical care was necessary to treat or prevent imminent or life-threatening deterioration.   Critical care was time spent personally by me on the following activities: development of treatment plan with patient and/or surrogate as well as nursing, discussions with consultants/primary team, evaluation of patient's response to treatment, examination of patient, obtaining history from patient or surrogate, ordering and performing treatments and interventions, ordering and review of laboratory studies, ordering and review of radiographic studies, and re-evaluation of patient's condition as needed, as documented above.

## 2022-01-08 NOTE — Sedation Documentation (Signed)
Provider has not taken "a run" for a TICI score from the first pass as of this note

## 2022-01-08 NOTE — ED Notes (Signed)
Code Stroke paged spoke to [place]

## 2022-01-08 NOTE — Procedures (Signed)
INR. Status post left vertebral arteriogram and right common carotid arteriogram. Right CFA approach. Findings. 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   Status post reconstruction of symptomatic occlusive long segment dissection extending from the proximal one third of the right internal carotid artery to the petrous cavernous junction using 3 pipeline flow diverter's and a 4 mm x 24 mm Neuroform Atlas stent in a  telescope fashion.  Final arteriogram from the right common carotid artery demonstrates complete revascularization without any filling defects with opacification of the right middle cerebral artery distribution maintaining aTICI 3 revascularization.  Immediate post CT brain demonstrates no evidence of intracranial hemorrhage. Patient left intubated as per anesthesia tube protect patient's airway due to presence of blood in the oral cavity.  Patient loaded with 81 mg of aspirin, and 180 mg of Brilinta via orogastric tube.  Patient also bolused with cangrelor IV half bolus dose, followed by a 4-hour low-dose infusion.  Patient to have a follow-up CT of the brain at 9:22 PM after stopping the cangrelor.   Arlean Hopping MD.

## 2022-01-08 NOTE — Transfer of Care (Signed)
Immediate Anesthesia Transfer of Care Note  Patient: Lauren [name]  Procedure(s) Performed: IR WITH ANESTHESIA  Patient Location: PACU and SICU  Anesthesia Type:General  Level of Consciousness: sedated and Patient remains intubated per anesthesia plan  Airway & Oxygen Therapy: Patient remains intubated per anesthesia plan and Patient placed on Ventilator (see vital sign flow sheet for setting)  Post-op Assessment: Report given to RN and Post -op Vital signs reviewed and stable  Post vital signs: Reviewed and stable  Last Vitals:  Vitals Value Taken Time  BP 95/58 01/08/22 1850  Temp 35.8 C 01/08/22 1854  Pulse 69 01/08/22 1854  Resp 19 01/08/22 1854  SpO2 100 % 01/08/22 1854  Vitals shown include unvalidated device data.  Last Pain:  Vitals:   01/08/22 1854  TempSrc: Axillary  PainSc:          Complications: No notable events documented.

## 2022-01-08 NOTE — ED Provider Notes (Signed)
[facility] EMERGENCY DEPARTMENT Provider Note   CSN: [id number] Arrival date & time: 01/08/22  1313  An emergency department physician performed an initial assessment on this suspected stroke patient at 49.  History  Chief Complaint  Patient presents with   Code Stroke    Aleina Safran is a 41 y.o. female.  The history is provided by the patient and medical records. No language interpreter was used.  Neurologic Problem This is a new problem. Episode onset: 11:30 AM. The problem occurs constantly. The problem has not changed since onset.Associated symptoms include chest pain and headaches. Pertinent negatives include no abdominal pain and no shortness of breath. Nothing aggravates the symptoms. Nothing relieves the symptoms. She has tried nothing for the symptoms. The treatment provided no relief.       Home Medications Prior to Admission medications   Medication Sig Start Date End Date Taking? Authorizing Provider  amoxicillin-clavulanate (AUGMENTIN) 875-125 MG tablet Take 1 tablet by mouth 2 (two) times daily for 14 days. 12/28/21 01/11/22  Criselda Peaches, DPM  apixaban (ELIQUIS) 5 MG TABS tablet Take 1 tablet by mouth twice daily 12/17/21   Marty Heck, MD  Apixaban Starter Pack, 70m and 5100m (ELIQUIS DVT/PE STARTER PACK) Take as directed on package: start with two-58m858mablets twice daily for 7 days. On day 8, switch to one-58mg658mblet twice daily. (Take two tabs (10mg23mice daily through 7/27, then on 7/28 reduce to 1 tab (58mg) 37mce daily.) Patient not taking: Reported on 12/22/2021 12/17/21   Clark,Marty Heckatorvastatin (LIPITOR) 40 MG tablet TAKE 1 TABLET BY MOUTH ONCE DAILY. Patient taking differently: Take 40 mg by mouth daily. 09/24/21   Barrett, RhondaEvelene Croon  clopidogrel (PLAVIX) 75 MG tablet Take 1 tablet (75 mg total) by mouth daily. 09/24/21   Barrett, RhondaEvelene Croon  Continuous Blood Gluc Sensor (DEXCOM G6 SENSOR) MISC Change  sensor every 10 days as directed 09/29/21   ReardoBrita RompContinuous Blood Gluc Transmit (DEXCOM G6 TRANSMITTER) MISC Change transmitter every 90 days as directed. 09/29/21   ReardoBrita Rompfenofibrate 160 MG tablet TAKE 1 TABLET BY MOUTH ONCE DAILY. Patient taking differently: Take 160 mg by mouth daily. 10/01/20   BranchArnoldo Lenisfurosemide (LASIX) 40 MG tablet Take 1 tablet (40 mg total) by mouth daily. Take daily or as directed Patient taking differently: Take 40 mg by mouth daily as needed for fluid or edema. 09/24/21   Barrett, RhondaEvelene Croon  hydrALAZINE (APRESOLINE) 25 MG tablet Take 1 & 1/2 tablets (37.5 mg total) by mouth 3 (three) times daily. Patient taking differently: Take 25 mg by mouth 3 (three) times daily. 02/19/21   BranchArnoldo LenisInsulin Disposable Pump (OMNIPOD 5 G6 INTRO, GEN 5,) KIT Change pod every 48-72 hrs 10/19/21   ReardoBrita RompInsulin Disposable Pump (OMNIPOD 5 G6 POD, GEN 5,) MISC Change pod every 48-72 hours 10/19/21   ReardoBrita Rompisosorbide mononitrate (IMDUR) 30 MG 24 hr tablet Take 1 tablet (30 mg total) by mouth daily. 09/24/21 09/19/22  Barrett, RhondaEvelene Croon  levothyroxine (SYNTHROID) 175 MCG tablet Take 175 mcg by mouth daily before breakfast.    [provider]  metoprolol succinate (TOPROL-XL) 25 MG 24 hr tablet Take 1 tablet (25 mg total) by mouth daily. 09/24/21   Barrett, RhondaEvelene Croon  nitroGLYCERIN (NITROSTAT) 0.4 MG SL tablet Place 1  tablet (0.4 mg total) under the tongue every 5 (five) minutes x 3 doses as needed for chest pain. 09/24/21   Barrett, Evelene Croon, PA-C  oxyCODONE (OXY IR/ROXICODONE) 5 MG immediate release tablet Take 1 tablet (5 mg total) by mouth every 6 (six) hours as needed for moderate pain. 11/21/21   Oswald Hillock, MD  pantoprazole (PROTONIX) 40 MG tablet Take 1 tablet (40 mg total) by mouth daily as needed for acid reflux Patient taking differently: Take 40 mg by mouth daily as  needed (heartburn). 11/26/20     potassium chloride SA (KLOR-CON M) 20 MEQ tablet Take 1 tablet (20 mEq total) by mouth daily 09/24/21   Barrett, Evelene Croon, PA-C  predniSONE (DELTASONE) 5 MG tablet 6 day taper; Take as directed on written instructions provided during visit Patient not taking: Reported on 12/22/2021 12/11/21   Mar Daring, PA-C      Allergies    Wellbutrin [bupropion], Cefepime, Ciprofloxacin hcl, and Tape    Review of Systems   Review of Systems  Unable to perform ROS: Acuity of condition  Constitutional:  Positive for chills. Negative for fatigue and fever.  HENT:  Negative for congestion.   Eyes:  Negative for photophobia.  Respiratory:  Positive for cough and chest tightness. Negative for shortness of breath and wheezing.   Cardiovascular:  Positive for chest pain.  Gastrointestinal:  Negative for abdominal pain, constipation, diarrhea, nausea and vomiting.  Genitourinary:  Negative for dysuria and flank pain.  Musculoskeletal:  Positive for gait problem. Negative for back pain, neck pain and neck stiffness.  Neurological:  Positive for numbness and headaches. Negative for light-headedness.  Psychiatric/Behavioral:  Negative for agitation and confusion.     Physical Exam Updated Vital Signs BP 119/84   Pulse 87   Temp 97.9 F (36.6 C)   Resp 18   Ht 5' 5"  (1.651 m)   Wt 91 kg   LMP 11/01/2021 (Approximate)   SpO2 97%   BMI 33.38 kg/m  Physical Exam Vitals and nursing note reviewed.  Constitutional:      General: She is not in acute distress.    Appearance: She is well-developed. She is ill-appearing. She is not toxic-appearing or diaphoretic.  HENT:     Head: Normocephalic and atraumatic.     Nose: Nose normal.     Mouth/Throat:     Mouth: Mucous membranes are moist.  Eyes:     Conjunctiva/sclera: Conjunctivae normal.     Pupils: Pupils are equal, round, and reactive to light.  Cardiovascular:     Rate and Rhythm: Normal rate and regular  rhythm.     Heart sounds: No murmur heard. Pulmonary:     Effort: Pulmonary effort is normal. No respiratory distress.     Breath sounds: Normal breath sounds. No wheezing, rhonchi or rales.  Chest:     Chest wall: Tenderness present.  Abdominal:     General: Abdomen is flat.     Palpations: Abdomen is soft.     Tenderness: There is no abdominal tenderness. There is no guarding or rebound.  Musculoskeletal:        General: Tenderness present. No swelling.     Cervical back: Neck supple.  Skin:    General: Skin is warm and dry.     Capillary Refill: Capillary refill takes less than 2 seconds.     Findings: No erythema.  Neurological:     Mental Status: She is alert.     Sensory: Sensory deficit  present.     Motor: Weakness present.     Gait: Gait abnormal.  Psychiatric:        Mood and Affect: Mood normal.     ED Results / Procedures / Treatments   Labs (all labs ordered are listed, but only abnormal results are displayed) Labs Reviewed  PROTIME-INR - Abnormal; Notable for the following components:      Result Value   Prothrombin Time 17.8 (*)    INR 1.5 (*)    All other components within normal limits  CBC - Abnormal; Notable for the following components:   RBC 2.60 (*)    Hemoglobin 7.6 (*)    HCT 23.9 (*)    All other components within normal limits  CBG MONITORING, ED - Abnormal; Notable for the following components:   Glucose-Capillary 235 (*)    All other components within normal limits  RESP PANEL BY RT-PCR (FLU A&B, COVID) ARPGX2  APTT  DIFFERENTIAL  ETHANOL  COMPREHENSIVE METABOLIC PANEL  RAPID URINE DRUG SCREEN, HOSP PERFORMED  URINALYSIS, ROUTINE W REFLEX MICROSCOPIC  I-STAT CHEM 8, ED  I-STAT BETA HCG BLOOD, ED ([facility], [facility], [facility] ONLY)  TROPONIN I (HIGH SENSITIVITY)    EKG None  Radiology CT ANGIO HEAD NECK W WO CM (CODE STROKE)  Result Date: 01/08/2022 CLINICAL DATA:  Neuro deficit, acute, stroke suspected; [id number] EXAM: CT ANGIOGRAPHY HEAD AND NECK  CT PERFUSION BRAIN TECHNIQUE: Multidetector CT imaging of the head and neck was performed using the standard protocol during bolus administration of intravenous contrast. Multiplanar CT image reconstructions and MIPs were obtained to evaluate the vascular anatomy. Carotid stenosis measurements (when applicable) are obtained utilizing NASCET criteria, using the distal internal carotid diameter as the denominator. Multiphase CT imaging of the brain was performed following IV bolus contrast injection. Subsequent parametric perfusion maps were calculated using RAPID software. RADIATION DOSE REDUCTION: This exam was performed according to the departmental dose-optimization program which includes automated exposure control, adjustment of the mA and/or kV according to patient size and/or use of iterative reconstruction technique. CONTRAST:  10m OMNIPAQUE IOHEXOL 350 MG/ML SOLN COMPARISON:  CT head from the same day. FINDINGS: CTA NECK FINDINGS Aortic arch: Great vessel origins are patent. Right carotid system: Common carotid artery is patent without significant stenosis. The ICA is occluded proximally with non opacification of the remainder of the neck. Left carotid system: Mild atherosclerosis at the carotid bifurcation without significant (greater than 50%) stenosis. Vertebral arteries: Small/non dominant right vertebral artery, which is irregular throughout its V2 segment and occluded at the V3 segment with non opacification of the proximal intradural vertebral artery and reconstitution of the distal intradural vertebral artery. Dominant left intradural vertebral artery is patent throughout the neck without significant) greater than 50%) stenosis. Skeleton: No acute findings. Other neck: No acute findings Upper chest: Moderate layering bilateral pleural effusions. Peribronchial thickening. Review of the MIP images confirms the above findings CTA HEAD FINDINGS Anterior circulation: Non-opacified right intracranial ICA.  Small but opacified right M1 MCA and proximal right M2 MCA branches. Severe left paraclinoid ICA stenosis. Left MCA and ACAs are patent. Posterior circulation: Proximal right intradural vertebral artery is not opacified. Distal intradural vertebral artery is opacified, likely from collaterals and retrograde flow. Left intradural vertebral artery, basilar artery and bilateral posterior cerebral arteries are patent without proximal hemodynamically significant stenosis. Venous sinuses: As permitted by contrast timing, patent. Review of the MIP images confirms the above findings CT Brain Perfusion Findings: ASPECTS: 10. CBF (<30%) Volume: 061mPerfusion (  Tmax>6.0s) volume: 59m Perfusion (Tmax>4.0s) volume: 38 mL Mismatch Volume: 015mInfarction Location:None identified. IMPRESSION: 1. 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 Right MCA and A1 ACA are patent, but diminutive. 2. Small/non dominant right vertebral artery, which is irregular throughout its V2 segment with age indeterminate occlusion at the V3 segment with non opacification of the proximal intradural vertebral artery and reconstitution of the distal intradural vertebral artery. 3. Severe left paraclinoid ICA stenosis. 4. One CT perfusion, no evidence of core infarct or penumbra; however, there is approximately 38 mL of T-max greater than 4 seconds in the right MCA territory which suggests possible oligemia. An MRI could provide more sensitive evaluation for acute infarct. 5. Moderate layering bilateral pleural effusions and peribronchial wall thickening. Recommend dedicated chest imaging. Code stroke imaging results were communicated on 01/08/2022 at 2:16 pm to provider Bhagat via telephone, who verbally acknowledged these results. Electronically Signed   By: FrMargaretha Sheffield.D.   On: 01/08/2022 14:19   CT CEREBRAL PERFUSION W CONTRAST  Result Date:  01/08/2022 CLINICAL DATA:  Neuro deficit, acute, stroke suspected; [id number]: CT ANGIOGRAPHY HEAD AND NECK CT PERFUSION BRAIN TECHNIQUE: Multidetector CT imaging of the head and neck was performed using the standard protocol during bolus administration of intravenous contrast. Multiplanar CT image reconstructions and MIPs were obtained to evaluate the vascular anatomy. Carotid stenosis measurements (when applicable) are obtained utilizing NASCET criteria, using the distal internal carotid diameter as the denominator. Multiphase CT imaging of the brain was performed following IV bolus contrast injection. Subsequent parametric perfusion maps were calculated using RAPID software. RADIATION DOSE REDUCTION: This exam was performed according to the departmental dose-optimization program which includes automated exposure control, adjustment of the mA and/or kV according to patient size and/or use of iterative reconstruction technique. CONTRAST:  5043mMNIPAQUE IOHEXOL 350 MG/ML SOLN COMPARISON:  CT head from the same day. FINDINGS: CTA NECK FINDINGS Aortic arch: Great vessel origins are patent. Right carotid system: Common carotid artery is patent without significant stenosis. The ICA is occluded proximally with non opacification of the remainder of the neck. Left carotid system: Mild atherosclerosis at the carotid bifurcation without significant (greater than 50%) stenosis. Vertebral arteries: Small/non dominant right vertebral artery, which is irregular throughout its V2 segment and occluded at the V3 segment with non opacification of the proximal intradural vertebral artery and reconstitution of the distal intradural vertebral artery. Dominant left intradural vertebral artery is patent throughout the neck without significant) greater than 50%) stenosis. Skeleton: No acute findings. Other neck: No acute findings Upper chest: Moderate layering bilateral pleural effusions. Peribronchial thickening. Review of the MIP  images confirms the above findings CTA HEAD FINDINGS Anterior circulation: Non-opacified right intracranial ICA. Small but opacified right M1 MCA and proximal right M2 MCA branches. Severe left paraclinoid ICA stenosis. Left MCA and ACAs are patent. Posterior circulation: Proximal right intradural vertebral artery is not opacified. Distal intradural vertebral artery is opacified, likely from collaterals and retrograde flow. Left intradural vertebral artery, basilar artery and bilateral posterior cerebral arteries are patent without proximal hemodynamically significant stenosis. Venous sinuses: As permitted by contrast timing, patent. Review of the MIP images confirms the above findings CT Brain Perfusion Findings: ASPECTS: 10. CBF (<30%) Volume: 0mL58mrfusion (Tmax>6.0s) volume: 0mL 44mfusion (Tmax>4.0s) volume: 38 mL Mismatch Volume: 0mL I24mrction Location:None identified. IMPRESSION: 1. 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 Right MCA and A1 ACA are patent, but diminutive. 2. Small/non dominant right vertebral artery, which is irregular throughout its V2 segment with age indeterminate occlusion at the V3 segment with non opacification of the proximal intradural vertebral artery and reconstitution of the distal intradural vertebral artery. 3. Severe left paraclinoid ICA stenosis. 4. One CT perfusion, no evidence of core infarct or penumbra; however, there is approximately 38 mL of T-max greater than 4 seconds in the right MCA territory which suggests possible oligemia. An MRI could provide more sensitive evaluation for acute infarct. 5. Moderate layering bilateral pleural effusions and peribronchial wall thickening. Recommend dedicated chest imaging. Code stroke imaging results were communicated on 01/08/2022 at 2:16 pm to provider Bhagat via telephone, who verbally acknowledged these results. Electronically Signed   By:  Margaretha Sheffield M.D.   On: 01/08/2022 14:19   CT HEAD CODE STROKE WO CONTRAST  Result Date: 01/08/2022 CLINICAL DATA:  Code stroke.  Left arm weakness.  Left facial droop. EXAM: CT HEAD WITHOUT CONTRAST TECHNIQUE: Contiguous axial images were obtained from the base of the skull through the vertex without intravenous contrast. RADIATION DOSE REDUCTION: This exam was performed according to the departmental dose-optimization program which includes automated exposure control, adjustment of the mA and/or kV according to patient size and/or use of iterative reconstruction technique. COMPARISON:  CT head without contrast 12/12/2021 FINDINGS: Brain: Periventricular white matter hypodensities adjacent to the frontal horn of the right lateral ventricles and in the right corona radiata are new since prior exam. No acute or focal cortical abnormality is present. Basal ganglia are intact. Insular ribbon is normal. The ventricles are of normal size. No significant extraaxial fluid collection is present. The brainstem and cerebellum are within normal limits. Vascular: Atherosclerotic calcifications are again seen within the cavernous internal carotid arteries. No hyperdense vessel is present. No significant interval change is present. Skull: Calvarium is intact. No focal lytic or blastic lesions are present. No significant extracranial soft tissue lesion is present. Sinuses/Orbits: Mild mucosal thickening is present maxillary sinuses bilaterally. The paranasal sinuses and mastoid air cells are otherwise clear. The globes and orbits are within normal limits. ASPECTS [facility] Stroke Program Early CT Score) - Ganglionic level infarction (caudate, lentiform nuclei, internal capsule, insula, M1-M3 cortex): 7/7 - Supraganglionic infarction (M4-M6 cortex): 3/3 Total score (0-10 with 10 being normal): 10/10 IMPRESSION: 1. Periventricular white matter hypodensities adjacent to the frontal horn of the right lateral ventricles and in  the right corona radiata are new since prior exam. This may represent acute ischemia. 2. No acute or focal cortical abnormality. 3. ASPECTS is 10/10. The above was relayed via text pager to Dr. Curly Shores on 01/08/2022 at 13:39 . Electronically Signed   By: San Morelle M.D.   On: 01/08/2022 13:41    Procedures Procedures     CRITICAL CARE Performed by: Gwenyth Allegra Cire Deyarmin Total critical care time: 30 minutes Critical care time was exclusive of separately billable procedures and treating other patients. Critical care was necessary to treat or prevent imminent or life-threatening deterioration. Critical care was time spent personally by me on the following activities: development of treatment plan with patient and/or surrogate as well as nursing, discussions with consultants, evaluation of patient's response to treatment, examination of patient, obtaining history from patient or surrogate, ordering and performing treatments and interventions, ordering and review of laboratory studies, ordering and review of radiographic studies, pulse oximetry and re-evaluation of patient's condition.  Medications Ordered in ED Medications  ticagrelor (BRILINTA) 90 MG tablet (has no administration in time range)  nitroGLYCERIN 100 mcg/mL intra-arterial injection (has no administration in time range)  aspirin 81 MG chewable tablet (has no administration in time range)  ticagrelor (BRILINTA) 90 MG tablet (has no administration in time range)  ceFAZolin (ANCEF) 2-4 GM/100ML-% IVPB (has no administration in time range)  iohexol (OMNIPAQUE) 350 MG/ML injection 50 mL (50 mLs Intravenous Contrast Given 01/08/22 1345)  iohexol (OMNIPAQUE) 350 MG/ML injection 40 mL (40 mLs Intravenous Contrast Given 01/08/22 1400)  fentaNYL (SUBLIMAZE) 100 MCG/2ML injection (  Override pull for Anesthesia 01/08/22 1419)    ED Course/ Medical Decision Making/ A&P                           Medical Decision Making Amount and/or  Complexity of Data Reviewed Labs: ordered. Radiology: ordered.  Risk Decision regarding hospitalization.    Kamilia Reyburn is a 41 y.o. female with a complex past medical history including hypertension, diabetes, hypothyroidism, previous stroke, CAD, CKD, and recent right arm ischemia status post right arm arterial thrombectomy as well as known osteomyelitis of the foot who presents with chest pain, shortness of breath, URI symptoms with cough, and while standing in triage was discovered to have strokelike symptoms.  Patient says that for the last few days she has had some URI symptoms with some cough, chest pain, shortness of breath but started having neurologic deficits at 11:30 AM.  Patient presented for evaluation and while waiting for initial triage, one of our charge nurses in the emergency department noticed the patient having facial droop and looking ataxic.  Patient quickly was activated as a code stroke and taken straight to the CT scanner bypassing her initial rooming.  I saw patient in CT scanner where she was complaining of some chest discomfort and shortness of breath but vital signs were reassuring.  She was noted to clearly have left-sided facial droop, left arm, and left leg weakness.  She also had some numbness of her left leg.  She had pulses in all extremities on my exam.  Chest was slightly tender to palpation and abdomen was nontender.  I did not appreciate a carotid bruit on my initial exam.  Pupils were symmetric and reactive.  Also of note, patient's left foot was wrapped up and was not undone prior to getting the imaging.   Patient quickly had CT imaging that revealed concern for acute carotid occlusion.  Given her recent arterial occlusion in the arm I suspect this is also new.  Patient taken to IR for further management.  I discussed with family that we ordered imaging and labs and COVID test to look for other etiologies of her chest pain and shortness of breath however  with the evidence of acute stroke and carotid occlusion she needed to go to IR for further evaluation.  She will be admitted for further management.  Of note, patient was taken to IR before x-rays, EKG, or other work-up was performed in the emergency department.         Final Clinical Impression(s) / ED Diagnoses Final diagnoses:  Carotid occlusion, right  Left-sided weakness    Rx / DC Orders ED Discharge Orders     None      Clinical Impression: 1. Carotid occlusion, right   2. Left-sided weakness     Disposition: Admit  This note was prepared with assistance of Dragon voice recognition software. Occasional wrong-word or sound-a-like substitutions  may have occurred due to the inherent limitations of voice recognition software.     Kyliana Standen, Gwenyth Allegra, MD 01/08/22 1435

## 2022-01-08 NOTE — Sedation Documentation (Signed)
Pt transported to 4N32 with Nash Mantis, [place] Tech and CRNA. Report called to [facility], [place]

## 2022-01-08 NOTE — Consult Note (Signed)
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  LKW: unknown.  Code stroke initiated. NIHSS: 8.  Imaging revealed: age indeterminate occlusion of proximal right ICA, V3 segment of right vertebral artery, left paraclinoid ICA stenosis. No core infarct or penumbra. Patient went immediately to IR for thrombectomy.    Of note, patient with recent arterial thrombectomy of the right upper extremity due to limb ischemia.  She has had URI x 2 days with recent sick contact.   COVID neg  Pertinent  Medical History   Past Medical History:  Diagnosis Date   Anemia    CAD (coronary artery disease)    a. s/p cath in 03/2014 showing 30% mid-LAD, moderate to severe disease along small D1, patent LCx, moderate to severe distal OM2 stenosis and moderate diffuse diease along RCA not amenable to PCI   CHF (congestive heart failure) ([place])    a. EF 55-60% in 12/2019 b. EF at 35-40% by echo in 05/2020   Diabetes mellitus without complication ([place])    Myocardial infarction ([place])    Neuropathy    Renal disorder    stage 3 kidney disease   Stroke ([place])       Significant Hospital Events: Including procedures, antibiotic start and stop dates in addition to other pertinent events   Admit 9/8, thrombectomy and intubation  Interim History / Subjective:    Objective   Blood pressure 119/84, pulse 87, temperature 97.9 F (36.6 C), resp. rate 18, height 5' 5" (1.651 m), weight 91 kg, last menstrual period 11/01/2021, SpO2 97 %.        Intake/Output Summary (Last 24 hours) at 01/08/2022 1627 Last  data filed at 01/08/2022 1430 Gross per 24 hour  Intake 50 ml  Output --  Net 50 ml   Filed Weights   01/08/22 1329  Weight: 91 kg    Examination: Intubated, sedated Small but equal pupils Still paralyzed Lungs clear Very poor pulses, ext lukewarm Groin site looks okay    Resolved Hospital Problem list     Assessment & Plan:  Right MCA and right vertebral stroke s/p thrombectomy, R ICA dissection repair, and stent placement --not a candidate for thrombolytics due to unknown LKW and use of Eliquis --TICI score pre:  --TICI score post: TICI 3 --Post procedure CTH neg for bleed --Etiology of stroke: recent right arm ischemia with arterial thrombectomy, secondary stroke work up in progress per neurology including TTE, Lipid panel, A1c --Goal SBP: 120-140 --Cangrelor x 4 hours, head CT at 2100  Acute hypoxic respiratory failure --Recent URI symptoms: Rapid covid/flu negative. --CXR: pending --Formal RPP and Respiratory Culture --Pre-procedure O2 requirements: room air.  Post procedure returns intubated.  -- Vent bundle  Combined systolic and diastolic heart failure, chronic --Last TTE 11/19/21:  LVEF 40-45%, moderate concentric left ventricular hypertrophy, grade II diastolic dysfunction. RV normal size and function  Chest pain --Unclear etiology: respiratory related on ddx --Initial troponin elevated at 1300 --EKG pending, completing on arrival to ICU --Trend trops, f/u echo  Right arm ischemia s/p arterial thrombectomy in July 2023 --Right [facility]  incision site well healed  --Home meds: Eliquis, ASA, Statin.  --Eliquis held due to acute stroke, timing for resumption per Neurology --Resume home statin   Osteomyelitis of the left foot with non healing diabetic foot ulcer --Follows with podiatry: fourth metatarsal head resection and proximal phalangeal resection will likely be necessary and is planned after possible revascularization  DM, type II with neuropathy and  hyperglycemia --SSI q 4 hours initiated  Stage III CKD --Baseline creatinine 2-2.22 --Continue supportive care, minimize nephrotoxic agents.   Hypothyroidism --Resume synthroid 175 mcg daily  Best Practice (right click and "Reselect all SmartList Selections" daily)   Diet/type: NPO DVT prophylaxis: per primary GI prophylaxis: PPI Lines: N/A Foley:  N/A Code Status:  full code Last date of multidisciplinary goals of care discussion [pending]  Labs   CBC: Recent Labs  Lab 01/08/22 1326 01/08/22 1345  WBC 10.0  --   NEUTROABS 7.3  --   HGB 7.6* 7.5*  HCT 23.9* 22.0*  MCV 91.9  --   PLT 399  --     Basic Metabolic Panel: Recent Labs  Lab 01/08/22 1326 01/08/22 1345  NA 137 138  K 4.1 4.3  CL 110 111  CO2 17*  --   GLUCOSE 258* 252*  BUN 26* 24*  CREATININE 1.94* 2.00*  CALCIUM 8.2*  --    GFR: Estimated Creatinine Clearance: 42.1 mL/min (A) (by C-G formula based on SCr of 2 mg/dL (H)). Recent Labs  Lab 01/08/22 1326  WBC 10.0    Liver Function Tests: Recent Labs  Lab 01/08/22 1326  AST 18  ALT 14  ALKPHOS 73  BILITOT 0.4  PROT 5.6*  ALBUMIN 2.6*   No results for input(s): "LIPASE", "AMYLASE" in the last 168 hours. No results for input(s): "AMMONIA" in the last 168 hours.  ABG    Component Value Date/Time   TCO2 19 (L) 01/08/2022 1345     Coagulation Profile: Recent Labs  Lab 01/08/22 1326  INR 1.5*    Cardiac Enzymes: No results for input(s): "CKTOTAL", "CKMB", "CKMBINDEX", "TROPONINI" in the last 168 hours.  HbA1C: HbA1c POC (<> result, manual entry)  Date/Time Value Ref Range Status  09/29/2021 08:47 AM 9.6 4.0 - 5.6 % Final   Hgb A1c MFr Bld  Date/Time Value Ref Range Status  06/19/2021 04:07 AM 9.8 (H) 4.8 - 5.6 % Final    Comment:    (NOTE) Pre diabetes:          5.7%-6.4%  Diabetes:              >6.4%  Glycemic control for   <7.0% adults with diabetes   06/13/2020 10:43 AM 9.5 (H) 4.8 - 5.6 % Final    Comment:     (NOTE) Pre diabetes:          5.7%-6.4%  Diabetes:              >6.4%  Glycemic control for   <7.0% adults with diabetes     CBG: Recent Labs  Lab 01/08/22 1319  GLUCAP 235*    Review of Systems:   Unable to obtain due to clinical state  Past Medical History:  She,  has a past medical history of Anemia, CAD (coronary artery disease), CHF (congestive heart failure) ([place]), Diabetes mellitus without complication ([place]), Myocardial infarction ([place]), Neuropathy, Renal disorder, and Stroke ([place]).   Surgical History:   Past Surgical History:  Procedure Laterality Date   AMPUTATION Left 09/02/2021   Procedure: AMPUTATION RAY;  Surgeon: Criselda Peaches, DPM;  Location: [place];  Service: Podiatry;  Laterality: Left;  sagittal saw, 3L bag saline & Pulse   Cardiac catherization     CHOLECYSTECTOMY     I & D EXTREMITY Left 08/30/2021   Procedure: IRRIGATION AND DEBRIDEMENT WITH BONE BIOPSY;  Surgeon: [name], DPM;  Location: [place];  Service: Podiatry;  Laterality: Left;   IRRIGATION AND DEBRIDEMENT FOOT Left 09/04/2021   Procedure: IRRIGATION AND DEBRIDEMENT FOOT AND CLOSURE;  Surgeon: Criselda Peaches, DPM;  Location: [place];  Service: Podiatry;  Laterality: Left;   THROMBECTOMY BRACHIAL ARTERY Right 11/18/2021   Procedure: RIGHT BRACHIAL, RADIAL, & ULNAR ARTERY THROMBECTOMY.;  Surgeon: Marty Heck, MD;  Location: [place];  Service: Vascular;  Laterality: Right;   TUBAL LIGATION       Social History:   reports that she quit smoking about 14 months ago. Her smoking use included cigarettes. She smoked an average of .25 packs per day. She has never used smokeless tobacco. She reports that she does not drink alcohol and does not use drugs.   Family History:  Her family history includes Breast cancer (age of onset: 22) in her paternal grandmother; CAD in her father and mother; CVA in her mother; Cancer in her paternal grandmother; Hyperlipidemia in her father and mother;  Hypertension in her father and mother; Thyroid disease in her mother.   Allergies Allergies  Allergen Reactions   Wellbutrin [Bupropion] Hives   Cefepime Rash    Tolerates penicilllin   Ciprofloxacin Hcl Hives and Rash    Hives/rash at injection site    Tape Rash     Home Medications  Prior to Admission medications   Medication Sig Start Date End Date Taking? Authorizing Provider  amoxicillin-clavulanate (AUGMENTIN) 875-125 MG tablet Take 1 tablet by mouth 2 (two) times daily for 14 days. 12/28/21 01/11/22  Criselda Peaches, DPM  apixaban (ELIQUIS) 5 MG TABS tablet Take 1 tablet by mouth twice daily 12/17/21   Marty Heck, MD  Apixaban Starter Pack, 69m and 518m (ELIQUIS DVT/PE STARTER PACK) Take as directed on package: start with two-72m32mablets twice daily for 7 days. On day 8, switch to one-72mg51mblet twice daily. (Take two tabs (10mg8mice daily through 7/27, then on 7/28 reduce to 1 tab (72mg) 41mce daily.) Patient not taking: Reported on 12/22/2021 12/17/21   Clark,Marty Heckatorvastatin (LIPITOR) 40 MG tablet TAKE 1 TABLET BY MOUTH ONCE DAILY. Patient taking differently: Take 40 mg by mouth daily. 09/24/21   Barrett, RhondaEvelene Croon  clopidogrel (PLAVIX) 75 MG tablet Take 1 tablet (75 mg total) by mouth daily. 09/24/21   Barrett, RhondaEvelene Croon  Continuous Blood Gluc Sensor (DEXCOM G6 SENSOR) MISC Change sensor every 10 days as directed 09/29/21   ReardoBrita RompContinuous Blood Gluc Transmit (DEXCOM G6 TRANSMITTER) MISC Change transmitter every 90 days as directed. 09/29/21   ReardoBrita Rompfenofibrate 160 MG tablet TAKE 1 TABLET BY MOUTH ONCE DAILY. Patient taking differently: Take 160 mg by mouth daily. 10/01/20   BranchArnoldo Lenisfurosemide (LASIX) 40 MG tablet Take 1 tablet (40 mg total) by mouth daily. Take daily or as directed Patient taking differently: Take 40 mg by mouth daily as needed for fluid or edema. 09/24/21   Barrett, RhondaEvelene Croon   hydrALAZINE (APRESOLINE) 25 MG tablet Take 1 & 1/2 tablets (37.5 mg total) by mouth 3 (three)  times daily. Patient taking differently: Take 25 mg by mouth 3 (three) times daily. 02/19/21   Arnoldo Lenis, MD  Insulin Disposable Pump (OMNIPOD 5 G6 INTRO, GEN 5,) KIT Change pod every 48-72 hrs 10/19/21   Brita Romp, NP  Insulin Disposable Pump (OMNIPOD 5 G6 POD, GEN 5,) MISC Change pod every 48-72 hours 10/19/21   Brita Romp, NP  isosorbide mononitrate (IMDUR) 30 MG 24 hr tablet Take 1 tablet (30 mg total) by mouth daily. 09/24/21 09/19/22  Barrett, Evelene Croon, PA-C  levothyroxine (SYNTHROID) 175 MCG tablet Take 175 mcg by mouth daily before breakfast.    [provider]  metoprolol succinate (TOPROL-XL) 25 MG 24 hr tablet Take 1 tablet (25 mg total) by mouth daily. 09/24/21   Barrett, Evelene Croon, PA-C  nitroGLYCERIN (NITROSTAT) 0.4 MG SL tablet Place 1 tablet (0.4 mg total) under the tongue every 5 (five) minutes x 3 doses as needed for chest pain. 09/24/21   Barrett, Evelene Croon, PA-C  oxyCODONE (OXY IR/ROXICODONE) 5 MG immediate release tablet Take 1 tablet (5 mg total) by mouth every 6 (six) hours as needed for moderate pain. 11/21/21   Oswald Hillock, MD  pantoprazole (PROTONIX) 40 MG tablet Take 1 tablet (40 mg total) by mouth daily as needed for acid reflux Patient taking differently: Take 40 mg by mouth daily as needed (heartburn). 11/26/20     potassium chloride SA (KLOR-CON M) 20 MEQ tablet Take 1 tablet (20 mEq total) by mouth daily 09/24/21   Barrett, Evelene Croon, PA-C  predniSONE (DELTASONE) 5 MG tablet 6 day taper; Take as directed on written instructions provided during visit Patient not taking: Reported on 12/22/2021 12/11/21   Mar Daring, PA-C     Critical care time: 88 mins     Erskine Emery MD PCCM

## 2022-01-08 NOTE — Sedation Documentation (Addendum)
This RN, Marya Amsler, RN and Patty, RN attempted to obtain bilateral posterior tibial pulses. Unable to palpate or doppler posterior tibial pulses. Dr. Estanislado Pandy notified.

## 2022-01-08 NOTE — Progress Notes (Incomplete)
eLink Physician-Brief Progress Note Patient Name: Carletta Feasel DOB: January 06, 1981 MRN: [id number]   Date of Service  01/08/2022  HPI/Events of Note  Multiple issues: 1. Review of CXR reveals ETT tip  just above the carina. Recommend retraction by 3 cm for optimal positioning. 2. ABG on = 7.213/34.7/148/14.2. 3. EKg reveals NSR, R atrial enlargement, possible inferior infarct, age undetermined and possible anterior infarct, age undetermined. Troponin = 1304 --> 860. It appears the Troponin has peaked and is now decreasing. Demand ischemia? Cardiac echo is already ordered to assess LVEF and wall motion.   eICU Interventions       Intervention Category Major Interventions: Respiratory failure - evaluation and management  Malene Blaydes [place] 01/08/2022, 11:41 PM

## 2022-01-08 NOTE — Progress Notes (Signed)
eLink Physician-Brief Progress Note Patient Name: Lorenna Lurry DOB: 1980/06/12 MRN: [id number]   Date of Service  01/08/2022  HPI/Events of Note  Hyperglycemia - Blood glucose = 427.  eICU Interventions  Plan: Will D/C Novolog SSI. Insulin IV infusion per [employer] protocol.      Intervention Category Major Interventions: Hyperglycemia - active titration of insulin therapy  Lysle Dingwall 01/08/2022, 9:16 PM

## 2022-01-08 NOTE — ED Triage Notes (Signed)
Pt arrives POV for eval of possible stroke like symptoms. ED tech reports unsteady gait on entering ED. This RN found pt to have L sided facial droop, L sided arm weakness w/ drift, L sided grip deficit and slurred speech. Pt w/ slurred speech. This RN activated code stroke and brought pt immediately to CT 1

## 2022-01-08 NOTE — Code Documentation (Addendum)
Stroke Response Nurse Documentation Code Documentation  Lauren [name] is a 41 y.o. female arriving to [facility]  via [employer] on 01/08/2022 with past medical hx of stroke, HTN, Diabetes, hypothyroidism, CAD, CKD, right arm ischemia. On Eliquis (apixaban) daily. Code stroke was activated by ED.   Patient from home where she was LKW at unclear time. Pt reports that she had a sudden onset chest discomfort at 1200. This is what brought her to the ED. When she arrived to the ED, the triage RN noted left sided weakness. The patient reports that she is unaware of this right sided weakness.   The Patient also reports that her son has been sick for a couple days with a fever and cough. Patient started feeling bad yesterday and did not feel well all day.   Stroke team at the bedside on patient arrival. Labs drawn and patient cleared for CT by Dr. Sherry Ruffing. Patient to CT with team. NIHSS 8 > 4, see documentation for details and code stroke times. Patient with left hemianopia, left facial droop, left arm weakness, left leg weakness, and dysarthria  on exam. The following imaging was completed:  CT Head, CTA, and CTP. Patient is not a candidate for IV Thrombolytic due to contraindicated with Eliquis. Patient is a candidate for IR due to occlusion per MD Bhagat.   Care Plan: Take pt to IR.   Bedside handoff with IR RN Jannifer Franklin  Stroke Response RN

## 2022-01-08 NOTE — Sedation Documentation (Signed)
As of this Note, provider has not given any TICI score.

## 2022-01-08 NOTE — Anesthesia Procedure Notes (Signed)
Arterial Line Insertion Start/End9/12/2021 2:20 PM, 01/08/2022 2:26 AM Performed by: Barnet Glasgow, MD, anesthesiologist  Patient location: OOR procedure area. Patient sedated radial was placed Catheter size: 20 G Hand hygiene performed  and maximum sterile barriers used   Attempts: 2 Procedure performed without using ultrasound guided technique. Following insertion, Biopatch. Post procedure assessment: normal  Patient tolerated the procedure well with no immediate complications.

## 2022-01-09 ENCOUNTER — Inpatient Hospital Stay (HOSPITAL_COMMUNITY): Payer: No Typology Code available for payment source

## 2022-01-09 ENCOUNTER — Other Ambulatory Visit: Payer: Self-pay

## 2022-01-09 ENCOUNTER — Encounter (HOSPITAL_COMMUNITY): Payer: Self-pay | Admitting: Radiology

## 2022-01-09 DIAGNOSIS — I6521 Occlusion and stenosis of right carotid artery: Secondary | ICD-10-CM | POA: Diagnosis not present

## 2022-01-09 LAB — BLOOD GAS, ARTERIAL
Acid-base deficit: 5.5 mmol/L — ABNORMAL HIGH (ref 0.0–2.0)
Bicarbonate: 18.8 mmol/L — ABNORMAL LOW (ref 20.0–28.0)
Drawn by: 418751
O2 Saturation: 99.6 %
Patient temperature: 37
pCO2 arterial: 31 mmHg — ABNORMAL LOW (ref 32–48)
pH, Arterial: 7.39 (ref 7.35–7.45)
pO2, Arterial: 131 mmHg — ABNORMAL HIGH (ref 83–108)

## 2022-01-09 LAB — BASIC METABOLIC PANEL
Anion gap: 8 (ref 5–15)
Anion gap: 11 (ref 5–15)
Anion gap: 17 — ABNORMAL HIGH (ref 5–15)
BUN: 32 mg/dL — ABNORMAL HIGH (ref 6–20)
BUN: 35 mg/dL — ABNORMAL HIGH (ref 6–20)
BUN: 38 mg/dL — ABNORMAL HIGH (ref 6–20)
CO2: 17 mmol/L — ABNORMAL LOW (ref 22–32)
CO2: 16 mmol/L — ABNORMAL LOW (ref 22–32)
CO2: 14 mmol/L — ABNORMAL LOW (ref 22–32)
Calcium: 7.1 mg/dL — ABNORMAL LOW (ref 8.9–10.3)
Calcium: 7.3 mg/dL — ABNORMAL LOW (ref 8.9–10.3)
Calcium: 7.1 mg/dL — ABNORMAL LOW (ref 8.9–10.3)
Chloride: 113 mmol/L — ABNORMAL HIGH (ref 98–111)
Chloride: 112 mmol/L — ABNORMAL HIGH (ref 98–111)
Chloride: 108 mmol/L (ref 98–111)
Creatinine, Ser: 2.5 mg/dL — ABNORMAL HIGH (ref 0.44–1.00)
Creatinine, Ser: 3.07 mg/dL — ABNORMAL HIGH (ref 0.44–1.00)
Creatinine, Ser: 3.63 mg/dL — ABNORMAL HIGH (ref 0.44–1.00)
GFR, Estimated: 24 mL/min — ABNORMAL LOW (ref >60)
GFR, Estimated: 19 mL/min — ABNORMAL LOW (ref >60)
GFR, Estimated: 16 mL/min — ABNORMAL LOW (ref >60)
Glucose, Bld: 182 mg/dL — ABNORMAL HIGH (ref 70–99)
Glucose, Bld: 179 mg/dL — ABNORMAL HIGH (ref 70–99)
Glucose, Bld: 302 mg/dL — ABNORMAL HIGH (ref 70–99)
Potassium: 3.9 mmol/L (ref 3.5–5.1)
Potassium: 4.5 mmol/L (ref 3.5–5.1)
Potassium: 4.3 mmol/L (ref 3.5–5.1)
Sodium: 138 mmol/L (ref 135–145)
Sodium: 139 mmol/L (ref 135–145)
Sodium: 139 mmol/L (ref 135–145)

## 2022-01-09 LAB — GLUCOSE, CAPILLARY
Glucose-Capillary: 113 mg/dL — ABNORMAL HIGH (ref 70–99)
Glucose-Capillary: 118 mg/dL — ABNORMAL HIGH (ref 70–99)
Glucose-Capillary: 118 mg/dL — ABNORMAL HIGH (ref 70–99)
Glucose-Capillary: 122 mg/dL — ABNORMAL HIGH (ref 70–99)
Glucose-Capillary: 137 mg/dL — ABNORMAL HIGH (ref 70–99)
Glucose-Capillary: 142 mg/dL — ABNORMAL HIGH (ref 70–99)
Glucose-Capillary: 154 mg/dL — ABNORMAL HIGH (ref 70–99)
Glucose-Capillary: 179 mg/dL — ABNORMAL HIGH (ref 70–99)
Glucose-Capillary: 186 mg/dL — ABNORMAL HIGH (ref 70–99)
Glucose-Capillary: 226 mg/dL — ABNORMAL HIGH (ref 70–99)
Glucose-Capillary: 253 mg/dL — ABNORMAL HIGH (ref 70–99)
Glucose-Capillary: 279 mg/dL — ABNORMAL HIGH (ref 70–99)
Glucose-Capillary: 288 mg/dL — ABNORMAL HIGH (ref 70–99)
Glucose-Capillary: 296 mg/dL — ABNORMAL HIGH (ref 70–99)
Glucose-Capillary: 348 mg/dL — ABNORMAL HIGH (ref 70–99)
Glucose-Capillary: 376 mg/dL — ABNORMAL HIGH (ref 70–99)
Glucose-Capillary: 439 mg/dL — ABNORMAL HIGH (ref 70–99)
Glucose-Capillary: 467 mg/dL — ABNORMAL HIGH (ref 70–99)

## 2022-01-09 LAB — CBC WITH DIFFERENTIAL/PLATELET
Abs Immature Granulocytes: 0 10*3/uL (ref 0.00–0.07)
Basophils Absolute: 0 10*3/uL (ref 0.0–0.1)
Basophils Relative: 0 %
Eosinophils Absolute: 0 10*3/uL (ref 0.0–0.5)
Eosinophils Relative: 0 %
HCT: 25.8 % — ABNORMAL LOW (ref 36.0–46.0)
Hemoglobin: 9 g/dL — ABNORMAL LOW (ref 12.0–15.0)
Lymphocytes Relative: 1 %
Lymphs Abs: 0.3 10*3/uL — ABNORMAL LOW (ref 0.7–4.0)
MCH: 31 pg (ref 26.0–34.0)
MCHC: 34.9 g/dL (ref 30.0–36.0)
MCV: 89 fL (ref 80.0–100.0)
Monocytes Absolute: 0.3 10*3/uL (ref 0.1–1.0)
Monocytes Relative: 1 %
Neutro Abs: 26 10*3/uL — ABNORMAL HIGH (ref 1.7–7.7)
Neutrophils Relative %: 98 %
Platelets: 327 10*3/uL (ref 150–400)
RBC: 2.9 MIL/uL — ABNORMAL LOW (ref 3.87–5.11)
RDW: 13.5 % (ref 11.5–15.5)
WBC: 26.5 10*3/uL — ABNORMAL HIGH (ref 4.0–10.5)
nRBC: 0 % (ref 0.0–0.2)
nRBC: 0 /100 WBC

## 2022-01-09 LAB — LIPID PANEL
Cholesterol: 85 mg/dL (ref 0–200)
HDL: 15 mg/dL — ABNORMAL LOW (ref >40)
LDL Cholesterol: 39 mg/dL (ref 0–99)
Total CHOL/HDL Ratio: 5.7 RATIO
Triglycerides: 154 mg/dL — ABNORMAL HIGH (ref <150)
VLDL: 31 mg/dL (ref 0–40)

## 2022-01-09 LAB — URINALYSIS, ROUTINE W REFLEX MICROSCOPIC
Bacteria, UA: NONE SEEN
Bilirubin Urine: NEGATIVE
Glucose, UA: 50 mg/dL — AB
Hgb urine dipstick: NEGATIVE
Ketones, ur: NEGATIVE mg/dL
Nitrite: NEGATIVE
Protein, ur: >=300 mg/dL — AB
Specific Gravity, Urine: 1.045 — ABNORMAL HIGH (ref 1.005–1.030)
pH: 7 (ref 5.0–8.0)

## 2022-01-09 LAB — LACTIC ACID, PLASMA: Lactic Acid, Venous: 1.9 mmol/L (ref 0.5–1.9)

## 2022-01-09 LAB — POCT I-STAT 7, (LYTES, BLD GAS, ICA,H+H)
Acid-base deficit: 15 mmol/L — ABNORMAL HIGH (ref 0.0–2.0)
Bicarbonate: 12.3 mmol/L — ABNORMAL LOW (ref 20.0–28.0)
Calcium, Ion: 1.07 mmol/L — ABNORMAL LOW (ref 1.15–1.40)
HCT: 22 % — ABNORMAL LOW (ref 36.0–46.0)
Hemoglobin: 7.5 g/dL — ABNORMAL LOW (ref 12.0–15.0)
O2 Saturation: 98 %
Patient temperature: 98.1
Potassium: 4.6 mmol/L (ref 3.5–5.1)
Sodium: 137 mmol/L (ref 135–145)
TCO2: 13 mmol/L — ABNORMAL LOW (ref 22–32)
pCO2 arterial: 31.6 mmHg — ABNORMAL LOW (ref 32–48)
pH, Arterial: 7.197 — CL (ref 7.35–7.45)
pO2, Arterial: 128 mmHg — ABNORMAL HIGH (ref 83–108)

## 2022-01-09 LAB — TRIGLYCERIDES: Triglycerides: 156 mg/dL — ABNORMAL HIGH (ref <150)

## 2022-01-09 LAB — PHOSPHORUS: Phosphorus: 3.8 mg/dL (ref 2.5–4.6)

## 2022-01-09 LAB — BETA-HYDROXYBUTYRIC ACID: Beta-Hydroxybutyric Acid: 0.38 mmol/L — ABNORMAL HIGH (ref 0.05–0.27)

## 2022-01-09 LAB — MRSA NEXT GEN BY PCR, NASAL: MRSA by PCR Next Gen: DETECTED — AB

## 2022-01-09 LAB — RAPID URINE DRUG SCREEN, HOSP PERFORMED
Amphetamines: NOT DETECTED
Barbiturates: NOT DETECTED
Benzodiazepines: NOT DETECTED
Cocaine: NOT DETECTED
Opiates: NOT DETECTED
Tetrahydrocannabinol: NOT DETECTED

## 2022-01-09 LAB — MAGNESIUM: Magnesium: 1.8 mg/dL (ref 1.7–2.4)

## 2022-01-09 LAB — HEMOGLOBIN A1C
Hgb A1c MFr Bld: 8 % — ABNORMAL HIGH (ref 4.8–5.6)
Mean Plasma Glucose: 182.9 mg/dL

## 2022-01-09 MED ORDER — SODIUM BICARBONATE 8.4 % IV SOLN
100.0000 meq | Freq: Once | INTRAVENOUS | Status: AC
  Administered 2022-01-09: 100 meq via INTRAVENOUS
  Filled 2022-01-09: qty 100

## 2022-01-09 MED ORDER — MUPIROCIN 2 % EX OINT
1.0000 | TOPICAL_OINTMENT | Freq: Two times a day (BID) | CUTANEOUS | Status: AC
  Administered 2022-01-09 – 2022-01-13 (×9): 1 via NASAL
  Filled 2022-01-09 (×2): qty 22

## 2022-01-09 MED ORDER — AMOXICILLIN-POT CLAVULANATE 500-125 MG PO TABS
1.0000 | ORAL_TABLET | Freq: Two times a day (BID) | ORAL | Status: DC
  Administered 2022-01-09 – 2022-01-14 (×10): 500 mg
  Filled 2022-01-09 (×12): qty 1

## 2022-01-09 MED ORDER — SODIUM CHLORIDE 0.9 % IV BOLUS
1000.0000 mL | Freq: Once | INTRAVENOUS | Status: AC
  Administered 2022-01-09: 1000 mL via INTRAVENOUS

## 2022-01-09 MED ORDER — STERILE WATER FOR INJECTION IV SOLN
INTRAVENOUS | Status: DC
  Filled 2022-01-09: qty 1000
  Filled 2022-01-09: qty 150

## 2022-01-09 MED ORDER — LEVOTHYROXINE SODIUM 75 MCG PO TABS
75.0000 ug | ORAL_TABLET | Freq: Every day | ORAL | Status: DC
Start: 2022-01-09 — End: 2022-01-27
  Administered 2022-01-09 – 2022-01-27 (×19): 75 ug
  Filled 2022-01-09 (×20): qty 1

## 2022-01-09 MED ORDER — AMOXICILLIN-POT CLAVULANATE 875-125 MG PO TABS
1.0000 | ORAL_TABLET | Freq: Two times a day (BID) | ORAL | Status: DC
Start: 2022-01-09 — End: 2022-01-09
  Administered 2022-01-09: 1
  Filled 2022-01-09: qty 1

## 2022-01-09 MED ORDER — DEXTROSE-NACL 5-0.9 % IV SOLN: INTRAVENOUS | Status: DC

## 2022-01-09 MED ORDER — FUROSEMIDE 10 MG/ML IJ SOLN
40.0000 mg | Freq: Once | INTRAMUSCULAR | Status: AC
Start: 2022-01-10 — End: 2022-01-10
  Administered 2022-01-10: 40 mg via INTRAVENOUS
  Filled 2022-01-09: qty 4

## 2022-01-09 MED ORDER — AMOXICILLIN-POT CLAVULANATE 875-125 MG PO TABS
1.0000 | ORAL_TABLET | Freq: Two times a day (BID) | ORAL | Status: DC
  Filled 2022-01-09: qty 1

## 2022-01-09 MED ORDER — INSULIN GLARGINE-YFGN 100 UNIT/ML ~~LOC~~ SOLN
30.0000 [IU] | SUBCUTANEOUS | Status: DC
  Administered 2022-01-09 – 2022-01-14 (×6): 30 [IU] via SUBCUTANEOUS
  Filled 2022-01-09 (×6): qty 0.3

## 2022-01-09 MED ORDER — AMOXICILLIN-POT CLAVULANATE 500-125 MG PO TABS
1.0000 | ORAL_TABLET | Freq: Two times a day (BID) | ORAL | Status: DC
  Filled 2022-01-09: qty 1

## 2022-01-09 MED ORDER — INSULIN ASPART 100 UNIT/ML IJ SOLN
0.0000 [IU] | INTRAMUSCULAR | Status: DC
  Administered 2022-01-09 (×2): 8 [IU] via SUBCUTANEOUS
  Administered 2022-01-10: 3 [IU] via SUBCUTANEOUS
  Administered 2022-01-10: 8 [IU] via SUBCUTANEOUS
  Administered 2022-01-10 – 2022-01-11 (×3): 3 [IU] via SUBCUTANEOUS
  Administered 2022-01-11: 2 [IU] via SUBCUTANEOUS
  Administered 2022-01-11: 8 [IU] via SUBCUTANEOUS
  Administered 2022-01-12: 5 [IU] via SUBCUTANEOUS
  Administered 2022-01-12: 11 [IU] via SUBCUTANEOUS

## 2022-01-09 NOTE — Progress Notes (Signed)
2100- This RN noticed patient's pupils were unequal. Right pupil slightly larger than left and non-reactive while left pupil was sluggish. A scheduled CT of head was already in place since the cangrelor gtt was turned off.   2154- Neurology paged after this RN got back with the patient from CT of head and let Dr. Cheral Marker know about unequal pupil. Dr. Cheral Marker came to bedside and did an exam with this RN present. The size difference was the right pupil 1 mm bigger than the left. If light was held on the pupils for a longer amount of time, both pupils would have a sluggish response to light. Dr. Cheral Marker did not seem too concerned about the fluctuation. No new orders at this time.

## 2022-01-09 NOTE — Progress Notes (Signed)
Transported from [id number] to MRI and back without complications

## 2022-01-09 NOTE — Progress Notes (Signed)
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  LKW: unknown.  Code stroke initiated. NIHSS: 8.  Imaging revealed: age indeterminate occlusion of proximal right ICA, V3 segment of right vertebral artery, left paraclinoid ICA stenosis. No core infarct or penumbra. Patient went immediately to IR for thrombectomy.    Of note, patient with recent arterial thrombectomy of the right upper extremity due to limb ischemia.  She has had URI x 2 days with recent sick contact.   COVID neg    Pertinent  Medical History   Past Medical History:  Diagnosis Date   Anemia    CAD (coronary artery disease)    a. s/p cath in 03/2014 showing 30% mid-LAD, moderate to severe disease along small D1, patent LCx, moderate to severe distal OM2 stenosis and moderate diffuse diease along RCA not amenable to PCI   CHF (congestive heart failure) ([employer])    a. EF 55-60% in 12/2019 b. EF at 35-40% by echo in 05/2020   Diabetes mellitus without complication ([place])    Myocardial infarction ([place])    Neuropathy    Renal disorder    stage 3 kidney disease   Stroke ([place])     Significant Hospital Events: Including procedures, antibiotic start and stop dates in addition to other pertinent events   Admitted 9/8, s/p tubectomy 9/8-mechanical ventilation  Interim History / Subjective:  Acidemia, hyperglycemia Sedated  Objective   Blood pressure 101/66, pulse 76, temperature 97.7 F (36.5 C), temperature source Oral, resp. rate (!) 25, height 5\' 5"  (1.651 m), weight 94.9 kg, last menstrual period 11/01/2021, SpO2  100 %.    Vent Mode: PRVC FiO2 (%):  [40 %] 40 % Set Rate:  [18 bmp-25 bmp] 25 bmp Vt Set:  [450 mL] 450 mL PEEP:  [5 cmH20-10 cmH20] 10 cmH20 Plateau Pressure:  [18 cmH20-22 cmH20] 21 cmH20   Intake/Output Summary (Last 24 hours) at 01/09/2022 0811 Last data filed at 01/09/2022 0700 Gross per 24 hour  Intake 4826.36 ml  Output 650 ml  Net 4176.36 ml   Filed Weights   01/08/22 1329 01/09/22 0500  Weight: 91 kg 94.9 kg    Examination: General: Middle-age, does not appear to be in distress, sedated HENT: Moist oral mucosa, endotracheal tube in place Lungs: Clear breath sounds bilaterally Cardiovascular: S1-S2 appreciated Abdomen: Soft, bowel sounds appreciated Extremities: No clubbing, no edema Neuro: Sedated GU:   Chest x-ray-no acute infiltrate ABG: 7.39/31/131  Resolved Hospital Problem list     Assessment & Plan:  Metabolic acidosis  AKI on chronic kidney disease May be related to DKA -Continue insulin drip -Monitor electrolytes, replete electrolytes as needed  Right MCA and right vertebral stroke s/p thrombectomy, right ICA dissection repair, stent placement -Not a candidate for thrombolytics due to unknown LKW and use of Eliquis -Postprocedural CT head negative for bleed -Secondary stroke work-up in process -Goal blood pressure systolic 1 96-2 40 -Did receive cangrelor  Acute hypoxemic respiratory failure -Recently had some URI symptoms -No chronic lung disease  Chronic systolic and diastolic heart failure -Last echocardiogram with a  left ventricular ejection fraction of 40 to 63%, grade 2 diastolic dysfunction -Continue support  History of coronary artery disease -Continue Brilinta  History of hypothyroidism -We will monitor and reinitiate Synthroid  Right hand ischemia s/p arterial thrombectomy in July 2023  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 -Follows up with podiatry -Plans for metatarsal head resection and proximal phalangeal  resection  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 -On insulin drip at present  Stage III chronic kidney disease with acute kidney injury -Continue supportive measures -Avoid nephrotoxic's  Leukocytosis is likely stress related  Was on Augmentin to be continued prior to foot surgery -Will order  Best Practice (right click and "Reselect all SmartList Selections" daily)   Diet/type: NPO DVT prophylaxis: SCD GI prophylaxis: PPI Lines: Central line Foley:  Yes, and it is still needed Code Status:  full code Last date of multidisciplinary goals of care discussion [pending]  Labs   CBC: Recent Labs  Lab 01/08/22 1326 01/08/22 1345 01/08/22 1940 01/08/22 2049 01/09/22 0101 01/09/22 0503  WBC 10.0  --  13.8*  --   --  26.5*  NEUTROABS 7.3  --   --   --   --  26.0*  HGB 7.6* 7.5* 5.6* 5.8* 7.5* 9.0*  HCT 23.9* 22.0* 17.9* 17.0* 22.0* 25.8*  MCV 91.9  --  92.3  --   --  89.0  PLT 399  --  431*  --   --  893    Basic Metabolic Panel: Recent Labs  Lab 01/08/22 1326 01/08/22 1345 01/08/22 2014 01/08/22 2049 01/09/22 0101 01/09/22 0503  NA 137 138 135 134* 137 138  K 4.1 4.3 5.8* 6.1* 4.6 3.9  CL 110 111 113*  --   --  113*  CO2 17*  --  14*  --   --  17*  GLUCOSE 258* 252* 483*  --   --  182*  BUN 26* 24* 28*  --   --  32*  CREATININE 1.94* 2.00* 2.15*  --   --  2.50*  CALCIUM 8.2*  --  7.0*  --   --  7.1*  MG  --   --  1.9  --   --  1.8  PHOS  --   --  5.0*  --   --  3.8   GFR: Estimated Creatinine Clearance: 34.4 mL/min (A) (by C-G formula based on SCr of 2.5 mg/dL (H)). Recent Labs  Lab 01/08/22 1326 01/08/22 1940 01/09/22 0214 01/09/22 0503  WBC 10.0 13.8*  --  26.5*  LATICACIDVEN  --   --  1.9  --     Liver Function Tests: Recent Labs  Lab 01/08/22 1326 01/08/22 2014  AST 18 34  ALT 14 19  ALKPHOS 73 67  BILITOT 0.4 0.5  PROT 5.6* 4.4*  ALBUMIN 2.6* 2.1*   No results for input(s): "LIPASE", "AMYLASE" in the last 168 hours. No  results for input(s): "AMMONIA" in the last 168 hours.  ABG    Component Value Date/Time   PHART 7.197 (LL) 01/09/2022 0101   PCO2ART 31.6 (L) 01/09/2022 0101   PO2ART 128 (H) 01/09/2022 0101   HCO3 12.3 (L) 01/09/2022 0101   TCO2 13 (L) 01/09/2022 0101   ACIDBASEDEF 15.0 (H) 01/09/2022 0101   O2SAT 98 01/09/2022 0101     Coagulation Profile: Recent Labs  Lab 01/08/22 1326  INR 1.5*    Cardiac Enzymes: No results for input(s): "CKTOTAL", "CKMB", "CKMBINDEX", "TROPONINI" in the last 168  hours.  HbA1C: HbA1c POC (<> result, manual entry)  Date/Time Value Ref Range Status  09/29/2021 08:47 AM 9.6 4.0 - 5.6 % Final   Hgb A1c MFr Bld  Date/Time Value Ref Range Status  06/19/2021 04:07 AM 9.8 (H) 4.8 - 5.6 % Final    Comment:    (NOTE) Pre diabetes:          5.7%-6.4%  Diabetes:              >6.4%  Glycemic control for   <7.0% adults with diabetes   06/13/2020 10:43 AM 9.5 (H) 4.8 - 5.6 % Final    Comment:    (NOTE) Pre diabetes:          5.7%-6.4%  Diabetes:              >6.4%  Glycemic control for   <7.0% adults with diabetes     CBG: Recent Labs  Lab 01/09/22 0303 01/09/22 0410 01/09/22 0506 01/09/22 0600 01/09/22 0656  GLUCAP 296* 226* 186* 179* 154*    Review of Systems:   Unable to provide history  Past Medical History:  She,  has a past medical history of Anemia, CAD (coronary artery disease), CHF (congestive heart failure) ([place]), Diabetes mellitus without complication ([place]), Myocardial infarction ([place]), Neuropathy, Renal disorder, and Stroke ([place]).   Surgical History:   Past Surgical History:  Procedure Laterality Date   AMPUTATION Left 09/02/2021   Procedure: AMPUTATION RAY;  Surgeon: Criselda Peaches, DPM;  Location: [facility];  Service: Podiatry;  Laterality: Left;  sagittal saw, 3L bag saline & Pulse   Cardiac catherization     CHOLECYSTECTOMY     I & D EXTREMITY Left 08/30/2021   Procedure: IRRIGATION AND DEBRIDEMENT WITH BONE BIOPSY;   Surgeon: [name], DPM;  Location: [facility];  Service: Podiatry;  Laterality: Left;   IRRIGATION AND DEBRIDEMENT FOOT Left 09/04/2021   Procedure: IRRIGATION AND DEBRIDEMENT FOOT AND CLOSURE;  Surgeon: Criselda Peaches, DPM;  Location: [facility];  Service: Podiatry;  Laterality: Left;   RADIOLOGY WITH ANESTHESIA N/A 01/08/2022   Procedure: IR WITH ANESTHESIA;  Surgeon: Radiologist, Medication, MD;  Location: [facility];  Service: Radiology;  Laterality: N/A;   THROMBECTOMY BRACHIAL ARTERY Right 11/18/2021   Procedure: RIGHT BRACHIAL, RADIAL, & ULNAR ARTERY THROMBECTOMY.;  Surgeon: Marty Heck, MD;  Location: [facility];  Service: Vascular;  Laterality: Right;   TUBAL LIGATION       Social History:   reports that she quit smoking about 14 months ago. Her smoking use included cigarettes. She smoked an average of .25 packs per day. She has never used smokeless tobacco. She reports that she does not drink alcohol and does not use drugs.   Family History:  Her family history includes Breast cancer (age of onset: 6) in her paternal grandmother; CAD in her father and mother; CVA in her mother; Cancer in her paternal grandmother; Hyperlipidemia in her father and mother; Hypertension in her father and mother; Thyroid disease in her mother.   Allergies Allergies  Allergen Reactions   Wellbutrin [Bupropion] Hives   Cefepime Rash    Tolerates penicilllin   Ciprofloxacin Hcl Hives and Rash    Hives/rash at injection site    Tape Rash    The patient is critically ill with multiple organ systems failure and requires high complexity decision making for assessment and support, frequent evaluation and titration of therapies, application of advanced monitoring technologies and extensive interpretation of multiple databases.  Critical Care Time devoted to patient care services described in this note independent of APP/resident time (if applicable)  is 35 minutes.   Sherrilyn Rist MD [facility] Personal pager: See Amion If unanswered, please page CCM On-call: [phone]

## 2022-01-09 NOTE — Progress Notes (Signed)
1900- right groin site noticed to be oozing blood. Manual pressure held by this RN for 25 min while receiving bedside report. Right groin dressing was changed. Deveshwar was paged about groin as well as blood clots in patient's mouth. Verbal order to stop cangrelor gtt early. Gtt stopped at [place].

## 2022-01-09 NOTE — Progress Notes (Signed)
Transitioning from Insulin gtt to phase 3. Basal insulin given at 1100. Will d/c dextrose fluid at 1200, and turn gtt off at 1300, beginning SSI at 1600.

## 2022-01-09 NOTE — Progress Notes (Signed)
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 She was started on Eliquis.   She presented to the ED 01/08/22 with complaints of several weeks of headaches and blurred vision. Patient was found to have left-sided facial droop, left arm weakness and slurred speech. Code Stroke activated.   Imaging revealed occlusion of the proximal right ICA in the neck, occlusion of the V3 segment of the right vertebral artery and severe left paraclinoid ICA stenosis. She was taken immediately to IR for thrombectomy. Patient left intubated post-procedure. Post-procedure CT brain demonstrated no evidence of intracranial hemorrhage. 4-hour infusion of cangrelor ordered. This was stopped early due to right groin oozing.   Subjective: Intubated, opens eyes to voice. Able to slightly move right arm/leg. Daughter and sister at the bedside.   Allergies: Wellbutrin [bupropion], Cefepime, Ciprofloxacin hcl, and Tape  Medications: Prior to Admission medications   Medication Sig Start Date End Date Taking? Authorizing Provider  amoxicillin-clavulanate (AUGMENTIN) 875-125 MG tablet Take 1 tablet by mouth 2 (two) times daily for 14 days. 12/28/21 01/11/22  Criselda Peaches, DPM  apixaban (ELIQUIS) 5 MG TABS tablet Take 1 tablet by mouth twice daily 12/17/21   Marty Heck, MD  Apixaban Starter Pack, 48m and 532m (ELIQUIS DVT/PE STARTER PACK) Take as directed on package: start with two-64m30mablets twice daily for 7 days. On day 8, switch to one-64mg53mblet twice daily. (Take two tabs (10mg78mice  daily through 7/27, then on 7/28 reduce to 1 tab (64mg) 51mce daily.) Patient not taking: Reported on 12/22/2021 12/17/21   Clark,Marty Heckatorvastatin (LIPITOR) 40 MG tablet TAKE 1 TABLET BY MOUTH ONCE DAILY. Patient taking differently: Take 40 mg by mouth daily. 09/24/21   Barrett, RhondaEvelene Croon  clopidogrel (PLAVIX) 75 MG tablet Take 1 tablet (75 mg total) by mouth daily. 09/24/21   Barrett, RhondaEvelene Croon  Continuous Blood Gluc Sensor (DEXCOM G6 SENSOR) MISC Change sensor every 10 days as directed 09/29/21   ReardoBrita RompContinuous Blood Gluc Transmit (DEXCOM G6 TRANSMITTER) MISC Change transmitter every 90 days as directed. 09/29/21   ReardoBrita Rompfenofibrate 160 MG tablet TAKE 1 TABLET BY MOUTH ONCE DAILY. Patient taking differently: Take 160 mg by mouth daily. 10/01/20   BranchArnoldo Lenisfurosemide (LASIX) 40 MG tablet Take 1 tablet (40 mg total) by mouth daily. Take daily or as directed Patient taking differently: Take 40 mg by mouth daily as needed for fluid or edema. 09/24/21   Barrett, RhondaEvelene Croon  hydrALAZINE (APRESOLINE) 25 MG tablet Take 1 & 1/2 tablets (37.5 mg total) by mouth 3 (three) times daily. Patient taking differently: Take 25 mg by mouth 3 (three) times daily. 02/19/21   BranchArnoldo LenisInsulin Disposable Pump (OMNIPOD 5 G6 INTRO, GEN 5,) KIT Change pod every 48-72 hrs 10/19/21   ReardoBrita RompInsulin Disposable Pump (OMNIPOD 5 G6 POD, GEN 5,) MISC Change pod every 48-72 hours 10/19/21   ReardoBrita Rompisosorbide mononitrate (IMDUR) 30 MG 24  hr tablet Take 1 tablet (30 mg total) by mouth daily. 09/24/21 09/19/22  Barrett, Evelene Croon, PA-C  levothyroxine (SYNTHROID) 175 MCG tablet Take 175 mcg by mouth daily before breakfast.    [provider]  metoprolol succinate (TOPROL-XL) 25 MG 24 hr tablet Take 1 tablet (25 mg total) by mouth daily. 09/24/21   Barrett, Evelene Croon, PA-C  nitroGLYCERIN (NITROSTAT) 0.4 MG SL  tablet Place 1 tablet (0.4 mg total) under the tongue every 5 (five) minutes x 3 doses as needed for chest pain. 09/24/21   Barrett, Evelene Croon, PA-C  oxyCODONE (OXY IR/ROXICODONE) 5 MG immediate release tablet Take 1 tablet (5 mg total) by mouth every 6 (six) hours as needed for moderate pain. 11/21/21   Oswald Hillock, MD  pantoprazole (PROTONIX) 40 MG tablet Take 1 tablet (40 mg total) by mouth daily as needed for acid reflux Patient taking differently: Take 40 mg by mouth daily as needed (heartburn). 11/26/20     potassium chloride SA (KLOR-CON M) 20 MEQ tablet Take 1 tablet (20 mEq total) by mouth daily 09/24/21   Barrett, Evelene Croon, PA-C  predniSONE (DELTASONE) 5 MG tablet 6 day taper; Take as directed on written instructions provided during visit Patient not taking: Reported on 12/22/2021 12/11/21   Mar Daring, PA-C     Vital Signs: BP 101/66   Pulse 76   Temp 97.7 F (36.5 C) (Oral)   Resp (!) 25   Ht 5' 5"  (1.651 m)   Wt 209 lb 3.5 oz (94.9 kg)   LMP 11/01/2021 (Approximate)   SpO2 100%   BMI 34.82 kg/m   Physical Exam Constitutional:      General: She is not in acute distress.    Comments: Opens eyes to voice  Cardiovascular:     Comments: Right groin vascular site is clean, soft, dry Pulmonary:     Comments: intubated Skin:    General: Skin is warm and dry.  Neurological:     Comments: Able to follow some commands. Able to move right arm/leg to command. No movement observed on the left.      Imaging: DG CHEST PORT 1 VIEW  Result Date: 01/09/2022 CLINICAL DATA:  Status post intubation. EXAM: PORTABLE CHEST 1 VIEW COMPARISON:  Chest radiograph dated 01/08/2021. FINDINGS: Interval retraction of the endotracheal tube with tip now approximately 4 cm above the carina. Left IJ central venous line in similar position. Similar or slightly worsened scratch pulmonary hazy and streaky densities, likely combination of atelectasis and vascular congestion and edema. Pneumonia is  not excluded. Trace left pleural effusion suspected. No pneumothorax. No acute osseous pathology. IMPRESSION: Interval retraction of the endotracheal tube with tip now approximately 4 cm above the carina. Similar or slightly worsened pulmonary opacities in the right upper lobe compared to the earlier radiograph. Electronically Signed   By: Anner Crete M.D.   On: 01/09/2022 01:04   CT HEAD WO CONTRAST (5MM)  Result Date: 01/08/2022 CLINICAL DATA:  Stroke follow-up EXAM: CT HEAD WITHOUT CONTRAST TECHNIQUE: Contiguous axial images were obtained from the base of the skull through the vertex without intravenous contrast. RADIATION DOSE REDUCTION: This exam was performed according to the departmental dose-optimization program which includes automated exposure control, adjustment of the mA and/or kV according to patient size and/or use of iterative reconstruction technique. COMPARISON:  01/08/2022 CT head FINDINGS: Brain: Redemonstrated hypodensity in the right frontal periventricular white matter. No additional hypodensity. No evidence of acute hemorrhage, mass, mass effect, or midline  shift. No hydrocephalus or extra-axial fluid collection. Vascular: No hyperdense vessel. Skull: Normal. Negative for fracture or focal lesion. Sinuses/Orbits: Mucosal thickening throughout the paranasal sinuses, with air-fluid levels with bubbly fluid in the maxillary sinuses and right sphenoid sinus. Fluid in the nasopharynx is likely related to intubation. The orbits are unremarkable. Other: The mastoid air cells are well aerated. IMPRESSION: 1. Redemonstrated hypodensity in the right frontal periventricular white matter. No additional acute intracranial process. No additional acute infarct or hemorrhage. 2. Air-fluid levels with bubbly fluid in the maxillary sinuses and right sphenoid sinus are likely related to intubation but can be seen in the setting of sinusitis. Electronically Signed   By: Merilyn Baba M.D.   On: 01/08/2022  21:57   DG Chest Portable 1 View  Result Date: 01/08/2022 CLINICAL DATA:  Chest pain. EXAM: PORTABLE CHEST 1 VIEW COMPARISON:  Chest radiograph dated 06/18/2020. FINDINGS: Endotracheal tube with tip just above the carina. Recommend retraction by 3 cm for optimal positioning. Left IJ central venous line with tip at the cavoatrial junction. There is shallow inspiration. There is cardiomegaly with vascular congestion and edema. Pneumonia is not excluded. Clinical correlation is recommended. Probable trace left pleural effusion. No pneumothorax. No acute osseous pathology. IMPRESSION: 1. Endotracheal tube with tip just above the carina. Recommend retraction by 3 cm for optimal positioning. 2. Cardiomegaly with vascular congestion and edema. Pneumonia is not excluded. Electronically Signed   By: Anner Crete M.D.   On: 01/08/2022 20:16   CT ANGIO HEAD NECK W WO CM (CODE STROKE)  Result Date: 01/08/2022 CLINICAL DATA:  Neuro deficit, acute, stroke suspected; [id number] EXAM: CT ANGIOGRAPHY HEAD AND NECK CT PERFUSION BRAIN TECHNIQUE: Multidetector CT imaging of the head and neck was performed using the standard protocol during bolus administration of intravenous contrast. Multiplanar CT image reconstructions and MIPs were obtained to evaluate the vascular anatomy. Carotid stenosis measurements (when applicable) are obtained utilizing NASCET criteria, using the distal internal carotid diameter as the denominator. Multiphase CT imaging of the brain was performed following IV bolus contrast injection. Subsequent parametric perfusion maps were calculated using RAPID software. RADIATION DOSE REDUCTION: This exam was performed according to the departmental dose-optimization program which includes automated exposure control, adjustment of the mA and/or kV according to patient size and/or use of iterative reconstruction technique. CONTRAST:  79m OMNIPAQUE IOHEXOL 350 MG/ML SOLN COMPARISON:  CT head from the same day.  FINDINGS: CTA NECK FINDINGS Aortic arch: Great vessel origins are patent. Right carotid system: Common carotid artery is patent without significant stenosis. The ICA is occluded proximally with non opacification of the remainder of the neck. Left carotid system: Mild atherosclerosis at the carotid bifurcation without significant (greater than 50%) stenosis. Vertebral arteries: Small/non dominant right vertebral artery, which is irregular throughout its V2 segment and occluded at the V3 segment with non opacification of the proximal intradural vertebral artery and reconstitution of the distal intradural vertebral artery. Dominant left intradural vertebral artery is patent throughout the neck without significant) greater than 50%) stenosis. Skeleton: No acute findings. Other neck: No acute findings Upper chest: Moderate layering bilateral pleural effusions. Peribronchial thickening. Review of the MIP images confirms the above findings CTA HEAD FINDINGS Anterior circulation: Non-opacified right intracranial ICA. Small but opacified right M1 MCA and proximal right M2 MCA branches. Severe left paraclinoid ICA stenosis. Left MCA and ACAs are patent. Posterior circulation: Proximal right intradural vertebral artery is not opacified. Distal intradural vertebral artery is opacified, likely from collaterals and retrograde flow. Left  intradural vertebral artery, basilar artery and bilateral posterior cerebral arteries are patent without proximal hemodynamically significant stenosis. Venous sinuses: As permitted by contrast timing, patent. Review of the MIP images confirms the above findings CT Brain Perfusion Findings: ASPECTS: 10. CBF (<30%) Volume: 179m Perfusion (Tmax>6.0s) volume: 087mPerfusion (Tmax>4.0s) volume: 38 mL Mismatch Volume: 79m61mnfarction Location:None identified. IMPRESSION: 1. Age indeterminate occlusion of the proximal right ICA in the neck with non opacification distally in the upper neck and  intracranially. Right MCA and A1 ACA are patent, but diminutive. 2. Small/non dominant right vertebral artery, which is irregular throughout its V2 segment with age indeterminate occlusion at the V3 segment with non opacification of the proximal intradural vertebral artery and reconstitution of the distal intradural vertebral artery. 3. Severe left paraclinoid ICA stenosis. 4. One CT perfusion, no evidence of core infarct or penumbra; however, there is approximately 38 mL of T-max greater than 4 seconds in the right MCA territory which suggests possible oligemia. An MRI could provide more sensitive evaluation for acute infarct. 5. Moderate layering bilateral pleural effusions and peribronchial wall thickening. Recommend dedicated chest imaging. Code stroke imaging results were communicated on 01/08/2022 at 2:16 pm to provider Bhagat via telephone, who verbally acknowledged these results. Electronically Signed   By: FreMargaretha SheffieldD.   On: 01/08/2022 14:19   CT CEREBRAL PERFUSION W CONTRAST  Result Date: 01/08/2022 CLINICAL DATA:  Neuro deficit, acute, stroke suspected; [id number]: CT ANGIOGRAPHY HEAD AND NECK CT PERFUSION BRAIN TECHNIQUE: Multidetector CT imaging of the head and neck was performed using the standard protocol during bolus administration of intravenous contrast. Multiplanar CT image reconstructions and MIPs were obtained to evaluate the vascular anatomy. Carotid stenosis measurements (when applicable) are obtained utilizing NASCET criteria, using the distal internal carotid diameter as the denominator. Multiphase CT imaging of the brain was performed following IV bolus contrast injection. Subsequent parametric perfusion maps were calculated using RAPID software. RADIATION DOSE REDUCTION: This exam was performed according to the departmental dose-optimization program which includes automated exposure control, adjustment of the mA and/or kV according to patient size and/or use of iterative  reconstruction technique. CONTRAST:  579m51mNIPAQUE IOHEXOL 350 MG/ML SOLN COMPARISON:  CT head from the same day. FINDINGS: CTA NECK FINDINGS Aortic arch: Great vessel origins are patent. Right carotid system: Common carotid artery is patent without significant stenosis. The ICA is occluded proximally with non opacification of the remainder of the neck. Left carotid system: Mild atherosclerosis at the carotid bifurcation without significant (greater than 50%) stenosis. Vertebral arteries: Small/non dominant right vertebral artery, which is irregular throughout its V2 segment and occluded at the V3 segment with non opacification of the proximal intradural vertebral artery and reconstitution of the distal intradural vertebral artery. Dominant left intradural vertebral artery is patent throughout the neck without significant) greater than 50%) stenosis. Skeleton: No acute findings. Other neck: No acute findings Upper chest: Moderate layering bilateral pleural effusions. Peribronchial thickening. Review of the MIP images confirms the above findings CTA HEAD FINDINGS Anterior circulation: Non-opacified right intracranial ICA. Small but opacified right M1 MCA and proximal right M2 MCA branches. Severe left paraclinoid ICA stenosis. Left MCA and ACAs are patent. Posterior circulation: Proximal right intradural vertebral artery is not opacified. Distal intradural vertebral artery is opacified, likely from collaterals and retrograde flow. Left intradural vertebral artery, basilar artery and bilateral posterior cerebral arteries are patent without proximal hemodynamically significant stenosis. Venous sinuses: As permitted by contrast timing, patent. Review of the MIP images confirms the  above findings CT Brain Perfusion Findings: ASPECTS: 10. CBF (<30%) Volume: 51m Perfusion (Tmax>6.0s) volume: 074mPerfusion (Tmax>4.0s) volume: 38 mL Mismatch Volume: 28m51mnfarction Location:None identified. IMPRESSION: 1. Age indeterminate  occlusion of the proximal right ICA in the neck with non opacification distally in the upper neck and intracranially. Right MCA and A1 ACA are patent, but diminutive. 2. Small/non dominant right vertebral artery, which is irregular throughout its V2 segment with age indeterminate occlusion at the V3 segment with non opacification of the proximal intradural vertebral artery and reconstitution of the distal intradural vertebral artery. 3. Severe left paraclinoid ICA stenosis. 4. One CT perfusion, no evidence of core infarct or penumbra; however, there is approximately 38 mL of T-max greater than 4 seconds in the right MCA territory which suggests possible oligemia. An MRI could provide more sensitive evaluation for acute infarct. 5. Moderate layering bilateral pleural effusions and peribronchial wall thickening. Recommend dedicated chest imaging. Code stroke imaging results were communicated on 01/08/2022 at 2:16 pm to provider Bhagat via telephone, who verbally acknowledged these results. Electronically Signed   By: FreMargaretha SheffieldD.   On: 01/08/2022 14:19   CT HEAD CODE STROKE WO CONTRAST  Result Date: 01/08/2022 CLINICAL DATA:  Code stroke.  Left arm weakness.  Left facial droop. EXAM: CT HEAD WITHOUT CONTRAST TECHNIQUE: Contiguous axial images were obtained from the base of the skull through the vertex without intravenous contrast. RADIATION DOSE REDUCTION: This exam was performed according to the departmental dose-optimization program which includes automated exposure control, adjustment of the mA and/or kV according to patient size and/or use of iterative reconstruction technique. COMPARISON:  CT head without contrast 12/12/2021 FINDINGS: Brain: Periventricular white matter hypodensities adjacent to the frontal horn of the right lateral ventricles and in the right corona radiata are new since prior exam. No acute or focal cortical abnormality is present. Basal ganglia are intact. Insular ribbon is normal.  The ventricles are of normal size. No significant extraaxial fluid collection is present. The brainstem and cerebellum are within normal limits. Vascular: Atherosclerotic calcifications are again seen within the cavernous internal carotid arteries. No hyperdense vessel is present. No significant interval change is present. Skull: Calvarium is intact. No focal lytic or blastic lesions are present. No significant extracranial soft tissue lesion is present. Sinuses/Orbits: Mild mucosal thickening is present maxillary sinuses bilaterally. The paranasal sinuses and mastoid air cells are otherwise clear. The globes and orbits are within normal limits. ASPECTS ([facility] Early CT Score) - Ganglionic level infarction (caudate, lentiform nuclei, internal capsule, insula, M1-M3 cortex): 7/7 - Supraganglionic infarction (M4-M6 cortex): 3/3 Total score (0-10 with 10 being normal): 10/10 IMPRESSION: 1. Periventricular white matter hypodensities adjacent to the frontal horn of the right lateral ventricles and in the right corona radiata are new since prior exam. This may represent acute ischemia. 2. No acute or focal cortical abnormality. 3. ASPECTS is 10/10. The above was relayed via text pager to Dr. BhaCurly [place]ores 01/08/2022 at 13:39 . Electronically Signed   By: ChrSan MorelleD.   On: 01/08/2022 13:41    Labs:  CBC: Recent Labs    12/12/21 0000 01/08/22 1326 01/08/22 1345 01/08/22 1940 01/08/22 2049 01/09/22 0101 01/09/22 0503  WBC 10.2 10.0  --  13.8*  --   --  26.5*  HGB 9.6* 7.6*   < > 5.6* 5.8* 7.5* 9.0*  HCT 28.8* 23.9*   < > 17.9* 17.0* 22.0* 25.8*  PLT 319 399  --  431*  --   --  327   < > = values in this interval not displayed.    COAGS: Recent Labs    08/28/21 0001 12/12/21 0000 01/08/22 1326  INR 1.1 1.0 1.5*  APTT 32 30 36    BMP: Recent Labs    12/12/21 0000 01/08/22 1326 01/08/22 1345 01/08/22 2014 01/08/22 2049 01/09/22 0101 01/09/22 0503  NA 135 137 138  135 134* 137 138  K 3.8 4.1 4.3 5.8* 6.1* 4.6 3.9  CL 105 110 111 113*  --   --  113*  CO2 23 17*  --  14*  --   --  17*  GLUCOSE 227* 258* 252* 483*  --   --  182*  BUN 23* 26* 24* 28*  --   --  32*  CALCIUM 8.4* 8.2*  --  7.0*  --   --  7.1*  CREATININE 2.03* 1.94* 2.00* 2.15*  --   --  2.50*  GFRNONAA 31* 33*  --  29*  --   --  24*    LIVER FUNCTION TESTS: Recent Labs    11/18/21 0555 11/30/21 1016 12/12/21 0000 01/08/22 1326 01/08/22 2014  BILITOT 1.0  --  0.3 0.4 0.5  AST 14*  --  16 18 34  ALT 15  --  19 14 19   ALKPHOS 105  --  80 73 67  PROT 7.3  --  5.6* 5.6* 4.4*  ALBUMIN 3.1* 3.2* 2.7* 2.6* 2.1*    Assessment and Plan:  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   Patient remains intubated. MRI brain and MR Angio head ordered/pending. OG  tube to be placed this morning for administration of medications including aspirin and brilinta.   NIR will continue to follow.   Electronically Signed: Soyla Dryer, AGACNP-BC [phone] 01/09/2022, 8:22 AM   I spent a total of 15 Minutes at the the patient's bedside AND on the patient's hospital floor or unit, greater than 50% of which was counseling/coordinating care for post stroke intervention.

## 2022-01-09 NOTE — Progress Notes (Addendum)
STROKE TEAM PROGRESS NOTE   INTERVAL HISTORY  Intubated. Husband at bedside.  She is intubated and on prop @ 10mg . On bicarb gtt and insulin gtt RN at bedside. Prop turned off for exam. Pupils equal and reactive. Right side moves purposeful. Left side is flaccid. Not following commands.  Spoke with CCM- CCM to assume care  CBC:  Recent Labs  Lab 01/08/22 1326 01/08/22 1345 01/08/22 1940 01/08/22 2049 01/09/22 0101 01/09/22 0503  WBC 10.0  --  13.8*  --   --  26.5*  NEUTROABS 7.3  --   --   --   --  26.0*  HGB 7.6*   < > 5.6*   < > 7.5* 9.0*  HCT 23.9*   < > 17.9*   < > 22.0* 25.8*  MCV 91.9  --  92.3  --   --  89.0  PLT 399  --  431*  --   --  327   < > = values in this interval not displayed.   Basic Metabolic Panel:  Recent Labs  Lab 01/08/22 2014 01/08/22 2049 01/09/22 0101 01/09/22 0503  NA 135   < > 137 138  K 5.8*   < > 4.6 3.9  CL 113*  --   --  113*  CO2 14*  --   --  17*  GLUCOSE 483*  --   --  182*  BUN 28*  --   --  32*  CREATININE 2.15*  --   --  2.50*  CALCIUM 7.0*  --   --  7.1*  MG 1.9  --   --  1.8  PHOS 5.0*  --   --  3.8   < > = values in this interval not displayed.   Lipid Panel:  Recent Labs  Lab 01/09/22 0503  CHOL 85  TRIG 154*  156*  HDL 15*  CHOLHDL 5.7  VLDL 31  LDLCALC 39   HgbA1c: No results for input(s): "HGBA1C" in the last 168 hours. Urine Drug Screen:  Recent Labs  Lab 01/09/22 0225  LABOPIA NONE DETECTED  COCAINSCRNUR NONE DETECTED  LABBENZ NONE DETECTED  AMPHETMU NONE DETECTED  THCU NONE DETECTED  LABBARB NONE DETECTED    Alcohol Level  Recent Labs  Lab 01/08/22 1339  ETH <10    IMAGING past 24 hours DG CHEST PORT 1 VIEW  Result Date: 01/09/2022 CLINICAL DATA:  Status post intubation. EXAM: PORTABLE CHEST 1 VIEW COMPARISON:  Chest radiograph dated 01/08/2021. FINDINGS: Interval retraction of the endotracheal tube with tip now approximately 4 cm above the carina. Left IJ central venous line in similar  position. Similar or slightly worsened scratch pulmonary hazy and streaky densities, likely combination of atelectasis and vascular congestion and edema. Pneumonia is not excluded. Trace left pleural effusion suspected. No pneumothorax. No acute osseous pathology. IMPRESSION: Interval retraction of the endotracheal tube with tip now approximately 4 cm above the carina. Similar or slightly worsened pulmonary opacities in the right upper lobe compared to the earlier radiograph. Electronically Signed   By: Anner Crete M.D.   On: 01/09/2022 01:04   CT HEAD WO CONTRAST (5MM)  Result Date: 01/08/2022 CLINICAL DATA:  Stroke follow-up EXAM: CT HEAD WITHOUT CONTRAST TECHNIQUE: Contiguous axial images were obtained from the base of the skull through the vertex without intravenous contrast. RADIATION DOSE REDUCTION: This exam was performed according to the departmental dose-optimization program which includes automated exposure control, adjustment of the mA and/or kV according to patient size  and/or use of iterative reconstruction technique. COMPARISON:  01/08/2022 CT head FINDINGS: Brain: Redemonstrated hypodensity in the right frontal periventricular white matter. No additional hypodensity. No evidence of acute hemorrhage, mass, mass effect, or midline shift. No hydrocephalus or extra-axial fluid collection. Vascular: No hyperdense vessel. Skull: Normal. Negative for fracture or focal lesion. Sinuses/Orbits: Mucosal thickening throughout the paranasal sinuses, with air-fluid levels with bubbly fluid in the maxillary sinuses and right sphenoid sinus. Fluid in the nasopharynx is likely related to intubation. The orbits are unremarkable. Other: The mastoid air cells are well aerated. IMPRESSION: 1. Redemonstrated hypodensity in the right frontal periventricular white matter. No additional acute intracranial process. No additional acute infarct or hemorrhage. 2. Air-fluid levels with bubbly fluid in the maxillary sinuses  and right sphenoid sinus are likely related to intubation but can be seen in the setting of sinusitis. Electronically Signed   By: Merilyn Baba M.D.   On: 01/08/2022 21:57   DG Chest Portable 1 View  Result Date: 01/08/2022 CLINICAL DATA:  Chest pain. EXAM: PORTABLE CHEST 1 VIEW COMPARISON:  Chest radiograph dated 06/18/2020. FINDINGS: Endotracheal tube with tip just above the carina. Recommend retraction by 3 cm for optimal positioning. Left IJ central venous line with tip at the cavoatrial junction. There is shallow inspiration. There is cardiomegaly with vascular congestion and edema. Pneumonia is not excluded. Clinical correlation is recommended. Probable trace left pleural effusion. No pneumothorax. No acute osseous pathology. IMPRESSION: 1. Endotracheal tube with tip just above the carina. Recommend retraction by 3 cm for optimal positioning. 2. Cardiomegaly with vascular congestion and edema. Pneumonia is not excluded. Electronically Signed   By: Anner Crete M.D.   On: 01/08/2022 20:16   CT ANGIO HEAD NECK W WO CM (CODE STROKE)  Result Date: 01/08/2022 CLINICAL DATA:  Neuro deficit, acute, stroke suspected; [id number] EXAM: CT ANGIOGRAPHY HEAD AND NECK CT PERFUSION BRAIN TECHNIQUE: Multidetector CT imaging of the head and neck was performed using the standard protocol during bolus administration of intravenous contrast. Multiplanar CT image reconstructions and MIPs were obtained to evaluate the vascular anatomy. Carotid stenosis measurements (when applicable) are obtained utilizing NASCET criteria, using the distal internal carotid diameter as the denominator. Multiphase CT imaging of the brain was performed following IV bolus contrast injection. Subsequent parametric perfusion maps were calculated using RAPID software. RADIATION DOSE REDUCTION: This exam was performed according to the departmental dose-optimization program which includes automated exposure control, adjustment of the mA and/or kV  according to patient size and/or use of iterative reconstruction technique. CONTRAST:  67mL OMNIPAQUE IOHEXOL 350 MG/ML SOLN COMPARISON:  CT head from the same day. FINDINGS: CTA NECK FINDINGS Aortic arch: Great vessel origins are patent. Right carotid system: Common carotid artery is patent without significant stenosis. The ICA is occluded proximally with non opacification of the remainder of the neck. Left carotid system: Mild atherosclerosis at the carotid bifurcation without significant (greater than 50%) stenosis. Vertebral arteries: Small/non dominant right vertebral artery, which is irregular throughout its V2 segment and occluded at the V3 segment with non opacification of the proximal intradural vertebral artery and reconstitution of the distal intradural vertebral artery. Dominant left intradural vertebral artery is patent throughout the neck without significant) greater than 50%) stenosis. Skeleton: No acute findings. Other neck: No acute findings Upper chest: Moderate layering bilateral pleural effusions. Peribronchial thickening. Review of the MIP images confirms the above findings CTA HEAD FINDINGS Anterior circulation: Non-opacified right intracranial ICA. Small but opacified right M1 MCA and proximal right M2 MCA  branches. Severe left paraclinoid ICA stenosis. Left MCA and ACAs are patent. Posterior circulation: Proximal right intradural vertebral artery is not opacified. Distal intradural vertebral artery is opacified, likely from collaterals and retrograde flow. Left intradural vertebral artery, basilar artery and bilateral posterior cerebral arteries are patent without proximal hemodynamically significant stenosis. Venous sinuses: As permitted by contrast timing, patent. Review of the MIP images confirms the above findings CT Brain Perfusion Findings: ASPECTS: 10. CBF (<30%) Volume: 19mL Perfusion (Tmax>6.0s) volume: 26mL Perfusion (Tmax>4.0s) volume: 38 mL Mismatch Volume: 69mL Infarction  Location:None identified. IMPRESSION: 1. 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 Right MCA and A1 ACA are patent, but diminutive. 2. Small/non dominant right vertebral artery, which is irregular throughout its V2 segment with age indeterminate occlusion at the V3 segment with non opacification of the proximal intradural vertebral artery and reconstitution of the distal intradural vertebral artery. 3. Severe left paraclinoid ICA stenosis. 4. One CT perfusion, no evidence of core infarct or penumbra; however, there is approximately 38 mL of T-max greater than 4 seconds in the right MCA territory which suggests possible oligemia. An MRI could provide more sensitive evaluation for acute infarct. 5. Moderate layering bilateral pleural effusions and peribronchial wall thickening. Recommend dedicated chest imaging. Code stroke imaging results were communicated on 01/08/2022 at 2:16 pm to provider Bhagat via telephone, who verbally acknowledged these results. Electronically Signed   By: Margaretha Sheffield M.D.   On: 01/08/2022 14:19   CT CEREBRAL PERFUSION W CONTRAST  Result Date: 01/08/2022 CLINICAL DATA:  Neuro deficit, acute, stroke suspected; [id number] EXAM: CT ANGIOGRAPHY HEAD AND NECK CT PERFUSION BRAIN TECHNIQUE: Multidetector CT imaging of the head and neck was performed using the standard protocol during bolus administration of intravenous contrast. Multiplanar CT image reconstructions and MIPs were obtained to evaluate the vascular anatomy. Carotid stenosis measurements (when applicable) are obtained utilizing NASCET criteria, using the distal internal carotid diameter as the denominator. Multiphase CT imaging of the brain was performed following IV bolus contrast injection. Subsequent parametric perfusion maps were calculated using RAPID software. RADIATION DOSE REDUCTION: This exam was performed according to the  departmental dose-optimization program which includes automated exposure control, adjustment of the mA and/or kV according to patient size and/or use of iterative reconstruction technique. CONTRAST:  68mL OMNIPAQUE IOHEXOL 350 MG/ML SOLN COMPARISON:  CT head from the same day. FINDINGS: CTA NECK FINDINGS Aortic arch: Great vessel origins are patent. Right carotid system: Common carotid artery is patent without significant stenosis. The ICA is occluded proximally with non opacification of the remainder of the neck. Left carotid system: Mild atherosclerosis at the carotid bifurcation without significant (greater than 50%) stenosis. Vertebral arteries: Small/non dominant right vertebral artery, which is irregular throughout its V2 segment and occluded at the V3 segment with non opacification of the proximal intradural vertebral artery and reconstitution of the distal intradural vertebral artery. Dominant left intradural vertebral artery is patent throughout the neck without significant) greater than 50%) stenosis. Skeleton: No acute findings. Other neck: No acute findings Upper chest: Moderate layering bilateral pleural effusions. Peribronchial thickening. Review of the MIP images confirms the above findings CTA HEAD FINDINGS Anterior circulation: Non-opacified right intracranial ICA. Small but opacified right M1 MCA and proximal right M2 MCA branches. Severe left paraclinoid ICA stenosis. Left MCA and ACAs are patent. Posterior circulation: Proximal right intradural vertebral artery is not opacified. Distal intradural vertebral artery is opacified, likely from collaterals and  retrograde flow. Left intradural vertebral artery, basilar artery and bilateral posterior cerebral arteries are patent without proximal hemodynamically significant stenosis. Venous sinuses: As permitted by contrast timing, patent. Review of the MIP images confirms the above findings CT Brain Perfusion Findings: ASPECTS: 10. CBF (<30%) Volume: 61mL  Perfusion (Tmax>6.0s) volume: 34mL Perfusion (Tmax>4.0s) volume: 38 mL Mismatch Volume: 61mL Infarction Location:None identified. IMPRESSION: 1. 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 Right MCA and A1 ACA are patent, but diminutive. 2. Small/non dominant right vertebral artery, which is irregular throughout its V2 segment with age indeterminate occlusion at the V3 segment with non opacification of the proximal intradural vertebral artery and reconstitution of the distal intradural vertebral artery. 3. Severe left paraclinoid ICA stenosis. 4. One CT perfusion, no evidence of core infarct or penumbra; however, there is approximately 38 mL of T-max greater than 4 seconds in the right MCA territory which suggests possible oligemia. An MRI could provide more sensitive evaluation for acute infarct. 5. Moderate layering bilateral pleural effusions and peribronchial wall thickening. Recommend dedicated chest imaging. Code stroke imaging results were communicated on 01/08/2022 at 2:16 pm to provider Bhagat via telephone, who verbally acknowledged these results. Electronically Signed   By: Margaretha Sheffield M.D.   On: 01/08/2022 14:19   CT HEAD CODE STROKE WO CONTRAST  Result Date: 01/08/2022 CLINICAL DATA:  Code stroke.  Left arm weakness.  Left facial droop. EXAM: CT HEAD WITHOUT CONTRAST TECHNIQUE: Contiguous axial images were obtained from the base of the skull through the vertex without intravenous contrast. RADIATION DOSE REDUCTION: This exam was performed according to the departmental dose-optimization program which includes automated exposure control, adjustment of the mA and/or kV according to patient size and/or use of iterative reconstruction technique. COMPARISON:  CT head without contrast 12/12/2021 FINDINGS: Brain: Periventricular white matter hypodensities adjacent to the frontal horn of the right lateral ventricles  and in the right corona radiata are new since prior exam. No acute or focal cortical abnormality is present. Basal ganglia are intact. Insular ribbon is normal. The ventricles are of normal size. No significant extraaxial fluid collection is present. The brainstem and cerebellum are within normal limits. Vascular: Atherosclerotic calcifications are again seen within the cavernous internal carotid arteries. No hyperdense vessel is present. No significant interval change is present. Skull: Calvarium is intact. No focal lytic or blastic lesions are present. No significant extracranial soft tissue lesion is present. Sinuses/Orbits: Mild mucosal thickening is present maxillary sinuses bilaterally. The paranasal sinuses and mastoid air cells are otherwise clear. The globes and orbits are within normal limits. ASPECTS [facility] Stroke Program Early CT Score) - Ganglionic level infarction (caudate, lentiform nuclei, internal capsule, insula, M1-M3 cortex): 7/7 - Supraganglionic infarction (M4-M6 cortex): 3/3 Total score (0-10 with 10 being normal): 10/10 IMPRESSION: 1. Periventricular white matter hypodensities adjacent to the frontal horn of the right lateral ventricles and in the right corona radiata are new since prior exam. This may represent acute ischemia. 2. No acute or focal cortical abnormality. 3. ASPECTS is 10/10. The above was relayed via text pager to Dr. Curly Shores on 01/08/2022 at 13:39 . Electronically Signed   By: San Morelle M.D.   On: 01/08/2022 13:41    PHYSICAL EXAM  Temp:  [96.5 F (35.8 C)-99.3 F (37.4 C)] 99.3 F (37.4 C) (09/09 1136) Pulse Rate:  [65-115] 79 (09/09 1200) Resp:  [15-26] 25 (09/09 1200) BP: (85-142)/(45-91) 115/74 (09/09 1200) SpO2:  [  78 %-100 %] 100 % (09/09 1200) Arterial Line BP: (111-141)/(48-70) 132/66 (09/09 1200) FiO2 (%):  [40 %] 40 % (09/09 0734) Weight:  [91 kg-94.9 kg] 94.9 kg (09/09 0500)  General - Critically ill intubated female on prop gtt Well  nourished, well developed, in no apparent distress. Cardiovascular - Regular rhythm and rate.  Mental Status -  She is intubated and sedated on Prop drip. Prop held for exam. Patient does not open eyes to noxious stimuli, or follow commands. PEERL, EOMI, unable to assess facial droop.   Motor Strength - Left side is flaccid. NO response to noxious stimuli. Right side moves spontaneously and purposeful. Motor Tone - Muscle tone was assessed at the neck and appendages and was normal.  Sensory - Responds to noxious stimuli equally   Coordination - Unable to assess  Gait and Station - deferred.   ASSESSMENT/PLAN [name] is a 41 y.o. 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 NIHSS of 8. Patient not a TPA candidate, underwent IR thrombectomy of R ICA  with R ICA dissection repair and stent placement with TICI 3  Stroke:  Acute Right MCA and vertebral stroke s/p thrombectomy with TICI 3 with right ICA dissection repair and stent placement  Etiology:  unclear, likely cardioembolic  Code Stroke CT head 1. Periventricular white matter hypodensities adjacent to the frontal horn of the right lateral ventricles and in the right corona radiata are new since prior exam. This may represent acute ischemia. 2. No acute or focal cortical abnormality. 3. ASPECTS is 10/10 CTA head & neck with CT perfusion  1. 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 Right MCA and A1 ACA are patent, but  diminutive. 2. Small/non dominant right vertebral artery, which is irregular throughout its V2 segment with age indeterminate occlusion at the V3 segment with non opacification of the proximal intradural vertebral artery and reconstitution of the distal intradural vertebral artery. 3. Severe left paraclinoid ICA stenosis. 4. One CT perfusion, no evidence of core infarct or penumbra; however, there is approximately 38 mL of T-max greater than 4 seconds in the right MCA territory which suggests possible oligemia. An MRI could provide more sensitive evaluation for acute infarct. 5. Moderate layering bilateral pleural effusions and peribronchial wall thickening. Recommend dedicated chest imaging. Cerebral angio  Occluded right internal carotid artery extracranially and intracranially to the supraclinoid region Status post endovascular complete revascularization of occluded right internal carotid artery proximally and distally intracranially with 1 pass with a 4 mm x 40 mm solitary extragenital device and proximal aspiration, 1 pass with a 5 mm x 37 mm amber trap retrieval device and aspiration proximally, and 1 pass with a 6.5 mm x 45 mm ambulatory with proximal aspiration achieving a complete revascularization of the internal carotid artery.   Status post reconstruction of symptomatic occlusive long segment dissection extending from the proximal one third of the right internal carotid artery to the petrous cavernous junction using 3 pipeline flow diverter's and a 4 mm x 24 mm Neuroform Atlas  stent in a  telescope fashion.  Final arteriogram from the right common carotid artery demonstrates complete revascularization without any filling defects with opacification of the right middle cerebral artery distribution maintaining aTICI 3 revascularization. Post IR CT  1. Redemonstrated hypodensity in the right frontal periventricular white matter. No additional acute intracranial process. No additional acute  infarct or hemorrhage. 2. Air-fluid levels with bubbly fluid in the maxillary sinuses and right sphenoid sinus are likely related to intubation but can be seen in the setting of sinusitis MRI  pending  MRA  pending  2D Echo pending LDL 39 HgbA1c 9.6 VTE prophylaxis - SCD's    Diet   Diet NPO time specified   Eliquis (apixaban) daily prior to admission, now on aspirin 81 mg daily and Brilinta (ticagrelor) 90 mg bid.  Therapy recommendations:  pending Disposition:  pending  Hypertension Home meds:  hydralazine, metoprolol, isosorbide Stable Long-term BP goal normotensive  Hyperlipidemia Home meds:  fenofibrate, not resumed in hospital LDL 39, goal < 70 Started on atorvastatin. Continue at discharge   Diabetes type II UnControlled Home meds:  insulin HgbA1c 9.6, goal < 7.0 CBGs Recent Labs    01/09/22 0656 01/09/22 0743 01/09/22 0850  GLUCAP 154* 122* 118*    SSI Needs close follow up with PCP to help manage DM   Other Stroke Risk Factors  Former Cigarette smoker advised to stop smoking Obesity, Body mass index is 34.82 kg/m., BMI >/= 30 associated with increased stroke risk, recommend weight loss, diet and exercise as appropriate  Hx stroke/TIA Coronary artery disease Congestive heart failure  Other Active Problems Acute hypoxic respiratory Failure  On ventilator managed by CCM On prop gtt  Chronic Combined HF Echo pending  Meds on hold currently  Hypothyroidism  Home synthroid   Metabolic Acidosis  DKA On insulin gtt  Correct electrolyte derangements  AKI on CKD  IVF   Osteomyelitis  left foot from diabetic ulcer  On augmentin  Following with podiatry    Hospital day # 1  Beulah Gandy DNP, ACNPC-AG   ATTENDING ATTESTATION:  41 year old status post thrombectomy for 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  She has a history of osteomyelitis in the left foot on Augmentin.  We will continue this while  in hospital.  CCM on board to help with vent management and will assume care given management of DKA and insulin drip.  Spoke with Dr. Ander Slade. Appreciate his assistance.  Continue aspirin and Brilinta due to stent placement.  MRI echo pending  Dr. Reeves Forth evaluated pt independently, reviewed imaging, chart, labs. Discussed and formulated plan with the APP. Please see APP note above for details.     This patient is critically ill due to respiratory distress, possible sepsis/osteo, DKA and  stroke s/p thrombectomy and at significant risk of neurological worsening, death form heart failure, respiratory failure, recurrent stroke, bleeding from [facility], seizure, sepsis. This patient's care requires constant monitoring of vital signs, hemodynamics, respiratory and cardiac monitoring, review of multiple databases, neurological assessment, discussion with family, other specialists and medical decision making of high complexity. I spent 35 minutes of neurocritical care time in the care of this patient.   Ileah Falkenstein,MD   To contact Stroke Continuity provider, please refer to [email or web address] After hours, contact General Neurology

## 2022-01-09 NOTE — Progress Notes (Signed)
   01/09/22 0214  Adult Ventilator Settings  PEEP 10 cmH20  Adult Ventilator Measurements  SpO2 100 %   Peep increased per order from [facility].

## 2022-01-09 NOTE — Progress Notes (Signed)
   01/09/22 0024  Airway 7 mm  Placement Date/Time: 01/08/22 (c) 1414   Grade View: Grade 2  Airway Device: Endotracheal Tube  Laryngoscope Blade: MAC;4  ETT Types: Oral  Size (mm): 7 mm  Cuffed: Cuffed;Min.occ.pres.  Insertion attempts: 1  Airway Equipment: Stylet  Placement Confi...  Secured at (cm) (S)  21 cm  Measured From Lips  Secured Location Left  Secured By Charity fundraiser  Adult Ventilator Settings  Set Rate (S)  25 bmp  Adult Ventilator Measurements  SpO2 100 %    ETT tube was pulled back 3cm and rate was increased per Dr. Oletta Darter at [place]. STAT CXR was put in by RN.

## 2022-01-09 NOTE — Addendum Note (Signed)
Addendum  created 01/09/22 1140 by Moshe Salisbury, CRNA   Intraprocedure Event edited

## 2022-01-09 NOTE — Progress Notes (Signed)
eLink Physician-Brief Progress Note Patient Name: Lauren [name] DOB: 1980-09-13 MRN: [id number]   Date of Service  01/09/2022  HPI/Events of Note  Notified of decreased urine output.  Pt with known CKD, with creatiine untrending over the past few days, latest 3.63 She had just been given a 1L bolus 4 hours ago, with only 37 cc out.  I/O indicates pt to be 6 liters positive at this time.  She is off pressors, MAP 90.  She remains intubated, FIO2 40%, PEEP 8.    eICU Interventions  Will give trial of lasix 40mg  IV.  Will monitor response.         [place] 01/09/2022, 11:35 PM

## 2022-01-09 NOTE — Progress Notes (Signed)
eLink Physician-Brief Progress Note Patient Name: Lauren [name] DOB: 11/19/1980 MRN: [id number]   Date of Service  01/09/2022  HPI/Events of Note  Multiple issues: 1. Urinary retention - Bladder scan reveals 450 mL. 2. ABG on /PRVC 25/TV 450/P 5 = 7.19/31.6/128/12.3. Worsening metabolic acidosis a concern. Etiology uncertain. Increased Lactic Acid vs renal dysfunction vs DKA vs combination of preceding. 3. Review of CXR now shows interval retraction of the endotracheal tube with tip now in satisfactory position approximately 4 cm above the carina. Similar or slightly worsened pulmonary opacities in the right upper lobe compared to the earlier radiograph.  eICU Interventions  Plan: I/O Cath PRN. Increase PEEP to 10. NaHCO3 100 meq IV x 1. NaHCO3 IV infusion at 50 mL/hour. Decrease 0.9 NaCl IV infusion to 25 mL/hour. Repeat ABG at 7:30 AM. Lactic Acid STAT. Beta Hydroxybutyric Acid now.      Intervention Category Major Interventions: Respiratory failure - evaluation and management;Other:;Acid-Base disturbance - evaluation and management  Jerol Rufener [place] 01/09/2022, 2:03 AM

## 2022-01-09 NOTE — Progress Notes (Signed)
PHARMACY NOTE:  ANTIMICROBIAL RENAL DOSAGE ADJUSTMENT  Current antimicrobial regimen includes a mismatch between antimicrobial dosage and estimated renal function.  As per policy approved by the Pharmacy & Therapeutics and Medical Executive Committees, the antimicrobial dosage will be adjusted accordingly.  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   Antimicrobial dosage has been changed to:  500-125 mg q12 hr      Thank you for allowing pharmacy to be a part of this patient's care.  Benetta Spar, PharmD, BCPS, BCCP Clinical Pharmacist  Please check AMION for all [facility] phone numbers After 10:00 PM, call [place] [phone]

## 2022-01-09 NOTE — Progress Notes (Signed)
PT Cancellation Note  Patient Details Name: Lauren [name] MRN: [id number] DOB: June 26, 1980   Cancelled Treatment:    Reason Eval/Treat Not Completed: Active bedrest order. Pt is intubated and sedated.   [name] 01/09/2022, 7:16 AM

## 2022-01-09 NOTE — Progress Notes (Signed)
eLink Physician-Brief Progress Note Patient Name: Lauren [name] DOB: March 26, 1981 MRN: [id number]   Date of Service  01/09/2022  HPI/Events of Note  Hyperglycemia - Blood glucose = 226 on insulin IV infusion. Lactic Acid = 1.9 and Beta Hydroxybutyric Acid level = 0.38 which is modestly elevated and supports a Dx of DKA against Dx of DKA is Anion Gap = 8.   eICU Interventions  Plan: D5 0.9 NaCl IV infusion to run at 75 mL/hour. D/C 0.9 NaCl IV infusion.     Intervention Category Major Interventions: Hyperglycemia - active titration of insulin therapy  Zabdiel Dripps [place] 01/09/2022, 4:25 AM

## 2022-01-09 NOTE — Progress Notes (Signed)
OT Cancellation Note  Patient Details Name: Lauren [name] MRN: [id number] DOB: [birth year]   Cancelled Treatment:    Reason Eval/Treat Not Completed: Active bedrest order (Strict bedrest order, OT to follow up as activity orders progress)  [name] [name] 01/09/2022, 7:02 AM

## 2022-01-10 ENCOUNTER — Inpatient Hospital Stay (HOSPITAL_COMMUNITY): Payer: No Typology Code available for payment source

## 2022-01-10 ENCOUNTER — Encounter (HOSPITAL_COMMUNITY): Payer: Self-pay | Admitting: Neurology

## 2022-01-10 DIAGNOSIS — I6389 Other cerebral infarction: Secondary | ICD-10-CM

## 2022-01-10 DIAGNOSIS — I6521 Occlusion and stenosis of right carotid artery: Secondary | ICD-10-CM | POA: Diagnosis not present

## 2022-01-10 LAB — CBC
HCT: 21.6 % — ABNORMAL LOW (ref 36.0–46.0)
HCT: 25 % — ABNORMAL LOW (ref 36.0–46.0)
Hemoglobin: 7.3 g/dL — ABNORMAL LOW (ref 12.0–15.0)
Hemoglobin: 8.4 g/dL — ABNORMAL LOW (ref 12.0–15.0)
MCH: 30.3 pg (ref 26.0–34.0)
MCH: 30.8 pg (ref 26.0–34.0)
MCHC: 33.8 g/dL (ref 30.0–36.0)
MCHC: 33.6 g/dL (ref 30.0–36.0)
MCV: 89.6 fL (ref 80.0–100.0)
MCV: 91.6 fL (ref 80.0–100.0)
Platelets: 315 10*3/uL (ref 150–400)
Platelets: 293 10*3/uL (ref 150–400)
RBC: 2.41 MIL/uL — ABNORMAL LOW (ref 3.87–5.11)
RBC: 2.73 MIL/uL — ABNORMAL LOW (ref 3.87–5.11)
RDW: 14.4 % (ref 11.5–15.5)
RDW: 14.5 % (ref 11.5–15.5)
WBC: 19.4 10*3/uL — ABNORMAL HIGH (ref 4.0–10.5)
WBC: 21.5 10*3/uL — ABNORMAL HIGH (ref 4.0–10.5)
nRBC: 0 % (ref 0.0–0.2)
nRBC: 0 % (ref 0.0–0.2)

## 2022-01-10 LAB — BASIC METABOLIC PANEL
Anion gap: 10 (ref 5–15)
Anion gap: 14 (ref 5–15)
Anion gap: 14 (ref 5–15)
BUN: 38 mg/dL — ABNORMAL HIGH (ref 6–20)
BUN: 41 mg/dL — ABNORMAL HIGH (ref 6–20)
BUN: 42 mg/dL — ABNORMAL HIGH (ref 6–20)
CO2: 18 mmol/L — ABNORMAL LOW (ref 22–32)
CO2: 18 mmol/L — ABNORMAL LOW (ref 22–32)
CO2: 19 mmol/L — ABNORMAL LOW (ref 22–32)
Calcium: 7 mg/dL — ABNORMAL LOW (ref 8.9–10.3)
Calcium: 7.2 mg/dL — ABNORMAL LOW (ref 8.9–10.3)
Calcium: 7.6 mg/dL — ABNORMAL LOW (ref 8.9–10.3)
Chloride: 107 mmol/L (ref 98–111)
Chloride: 108 mmol/L (ref 98–111)
Chloride: 111 mmol/L (ref 98–111)
Creatinine, Ser: 3.8 mg/dL — ABNORMAL HIGH (ref 0.44–1.00)
Creatinine, Ser: 4.17 mg/dL — ABNORMAL HIGH (ref 0.44–1.00)
Creatinine, Ser: 4.54 mg/dL — ABNORMAL HIGH (ref 0.44–1.00)
GFR, Estimated: 12 mL/min — ABNORMAL LOW (ref >60)
GFR, Estimated: 13 mL/min — ABNORMAL LOW (ref >60)
GFR, Estimated: 15 mL/min — ABNORMAL LOW (ref >60)
Glucose, Bld: 141 mg/dL — ABNORMAL HIGH (ref 70–99)
Glucose, Bld: 175 mg/dL — ABNORMAL HIGH (ref 70–99)
Glucose, Bld: 246 mg/dL — ABNORMAL HIGH (ref 70–99)
Potassium: 3.9 mmol/L (ref 3.5–5.1)
Potassium: 4 mmol/L (ref 3.5–5.1)
Potassium: 4.3 mmol/L (ref 3.5–5.1)
Sodium: 139 mmol/L (ref 135–145)
Sodium: 139 mmol/L (ref 135–145)
Sodium: 141 mmol/L (ref 135–145)

## 2022-01-10 LAB — ECHOCARDIOGRAM COMPLETE BUBBLE STUDY
AR max vel: 2.82 cm2
AV Area VTI: 2.86 cm2
AV Area mean vel: 2.71 cm2
AV Mean grad: 4 mmHg
AV Peak grad: 7.4 mmHg
Ao pk vel: 1.36 m/s
Calc EF: 40.8 %
MV M vel: 3.65 m/s
MV Peak grad: 53.3 mmHg
S' Lateral: 3.4 cm
Single Plane A2C EF: 36.5 %
Single Plane A4C EF: 45.5 %

## 2022-01-10 LAB — MAGNESIUM: Magnesium: 1.9 mg/dL (ref 1.7–2.4)

## 2022-01-10 LAB — GLUCOSE, CAPILLARY
Glucose-Capillary: 141 mg/dL — ABNORMAL HIGH (ref 70–99)
Glucose-Capillary: 154 mg/dL — ABNORMAL HIGH (ref 70–99)
Glucose-Capillary: 167 mg/dL — ABNORMAL HIGH (ref 70–99)
Glucose-Capillary: 193 mg/dL — ABNORMAL HIGH (ref 70–99)
Glucose-Capillary: 94 mg/dL (ref 70–99)
Glucose-Capillary: 99 mg/dL (ref 70–99)

## 2022-01-10 LAB — CREATININE, URINE, RANDOM: Creatinine, Urine: 61 mg/dL

## 2022-01-10 LAB — PREPARE RBC (CROSSMATCH)

## 2022-01-10 LAB — SODIUM, URINE, RANDOM: Sodium, Ur: 84 mmol/L

## 2022-01-10 LAB — PHOSPHORUS: Phosphorus: 5.7 mg/dL — ABNORMAL HIGH (ref 2.5–4.6)

## 2022-01-10 MED ORDER — LACTATED RINGERS IV BOLUS
500.0000 mL | Freq: Once | INTRAVENOUS | Status: AC
  Administered 2022-01-10: 500 mL via INTRAVENOUS

## 2022-01-10 MED ORDER — ORAL CARE MOUTH RINSE
15.0000 mL | OROMUCOSAL | Status: DC
  Administered 2022-01-10 – 2022-01-11 (×2): 15 mL via OROMUCOSAL

## 2022-01-10 MED ORDER — FUROSEMIDE 10 MG/ML IJ SOLN
80.0000 mg | Freq: Three times a day (TID) | INTRAMUSCULAR | Status: AC
  Administered 2022-01-10: 80 mg via INTRAVENOUS
  Filled 2022-01-10: qty 8

## 2022-01-10 MED ORDER — ORAL CARE MOUTH RINSE
15.0000 mL | OROMUCOSAL | Status: DC | PRN
  Administered 2022-01-10: 15 mL via OROMUCOSAL

## 2022-01-10 MED ORDER — FUROSEMIDE 10 MG/ML IJ SOLN
80.0000 mg | Freq: Three times a day (TID) | INTRAMUSCULAR | Status: DC
  Administered 2022-01-10: 80 mg via INTRAVENOUS
  Filled 2022-01-10: qty 8

## 2022-01-10 MED ORDER — PERFLUTREN LIPID MICROSPHERE
1.0000 mL | INTRAVENOUS | Status: AC | PRN
  Administered 2022-01-10: 3 mL via INTRAVENOUS

## 2022-01-10 MED ORDER — SODIUM CHLORIDE 0.9% IV SOLUTION: Freq: Once | INTRAVENOUS | Status: AC

## 2022-01-10 NOTE — Progress Notes (Signed)
PT Cancellation Note  Patient Details Name: Lauren [name] MRN: [id number] DOB: 16-Mar-1981   Cancelled Treatment:    Reason Eval/Treat Not Completed: Patient not medically ready (intubated; active bedrest order)  Wyona Almas, PT, DPT Acute Rehabilitation Services Office [place] 01/10/2022, 7:45 AM

## 2022-01-10 NOTE — Evaluation (Signed)
Clinical/Bedside Swallow Evaluation Patient Details  Name: Lauren [name] MRN: [id number] Date of Birth: 01/09/1981  Today's Date: 01/10/2022 Time: SLP Start Time (ACUTE ONLY): 1400 SLP Stop Time (ACUTE ONLY): 1410 SLP Time Calculation (min) (ACUTE ONLY): 10 min  Past Medical History:  Past Medical History:  Diagnosis Date   Anemia    CAD (coronary artery disease)    a. s/p cath in 03/2014 showing 30% mid-LAD, moderate to severe disease along small D1, patent LCx, moderate to severe distal OM2 stenosis and moderate diffuse diease along RCA not amenable to PCI   CHF (congestive heart failure) ([place])    a. EF 55-60% in 12/2019 b. EF at 35-40% by echo in 05/2020   CKD (chronic kidney disease) stage 4, GFR 15-29 ml/min (HCC)    stage 3 kidney disease   Diabetes mellitus without complication ([place])    Myocardial infarction ([place])    Neuropathy    Stroke [facility])    Past Surgical History:  Past Surgical History:  Procedure Laterality Date   AMPUTATION Left 09/02/2021   Procedure: AMPUTATION RAY;  Surgeon: Criselda Peaches, DPM;  Location: [place];  Service: Podiatry;  Laterality: Left;  sagittal saw, 3L bag saline & Pulse   Cardiac catherization     CHOLECYSTECTOMY     I & D EXTREMITY Left 08/30/2021   Procedure: IRRIGATION AND DEBRIDEMENT WITH BONE BIOPSY;  Surgeon: [name], DPM;  Location: [place];  Service: Podiatry;  Laterality: Left;   IRRIGATION AND DEBRIDEMENT FOOT Left 09/04/2021   Procedure: IRRIGATION AND DEBRIDEMENT FOOT AND CLOSURE;  Surgeon: Criselda Peaches, DPM;  Location: [place];  Service: Podiatry;  Laterality: Left;   RADIOLOGY WITH ANESTHESIA N/A 01/08/2022   Procedure: IR WITH ANESTHESIA;  Surgeon: Radiologist, Medication, MD;  Location: [place];  Service: Radiology;  Laterality: N/A;   THROMBECTOMY BRACHIAL ARTERY Right 11/18/2021   Procedure: RIGHT BRACHIAL, RADIAL, & ULNAR ARTERY THROMBECTOMY.;  Surgeon: Marty Heck, MD;  Location[facility];  Service: Vascular;   Laterality: Right;   TUBAL LIGATION     HPI:  Lauren [name] is a 41 y.o. female who presented to the ED for acute onset of chest pain with left side weakness. CT head right MCA and vertebral stroke; underwent IR thrombectomy of R ICA with R ICA dissection repair and stent placement with TICI 3. Intubated 9/8-10.  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    Assessment / Plan / Recommendation  Clinical Impression  Pt intermittently alert. She followed some basic commands (stick out tongue, close eyes). She was observed to swallow spontaneously.  She showed no awareness/recognition of POs introduced to mouth, with spilling of 1/2 tspn water/ ice chips from lips. No manipulation/observable effort could be elicited.  Continue NPO. She will likely need enteral feeding pending improvements in MS. SLP will follow for PO readiness and cogntive/linguistic eval as pt is able. D/W RN. SLP Visit Diagnosis: Dysphagia, oropharyngeal phase (R13.12)    Aspiration Risk  Severe aspiration risk    Diet Recommendation   NPO  Medication Administration: Via alternative means    Other  Recommendations Oral Care Recommendations: Oral care QID    Recommendations for follow up therapy are one component of a multi-disciplinary discharge planning process,  led by the attending physician.  Recommendations may be updated based on patient status, additional functional criteria and insurance authorization.  Follow up Recommendations Other (comment) (tba)      Assistance Recommended at Discharge    Functional Status Assessment    Frequency and Duration min 3x week  2 weeks       Prognosis Prognosis for Safe Diet Advancement: Good       Swallow Study   General Date of Onset: 01/08/22 HPI: Lauren [name] is a 41 y.o. female who presented to the ED for acute onset of chest pain with left side weakness. CT head right MCA and vertebral stroke; underwent IR thrombectomy of R ICA with R ICA dissection repair and stent placement with TICI 3. Intubated 9/8-10.  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 Type of Study: Bedside Swallow Evaluation Previous Swallow Assessment: no Diet Prior to this Study: NPO Temperature Spikes Noted: No Respiratory Status: Nasal cannula History of Recent Intubation: Yes Length of Intubations (days): 2 days Date extubated: 01/10/22 Behavior/Cognition: Lethargic/Drowsy Oral Cavity Assessment: Within Functional Limits Oral Care Completed by SLP: Recent completion by staff Self-Feeding Abilities: Total assist Patient Positioning: Upright in bed Baseline Vocal Quality: Not observed Volitional Cough: Cognitively unable to elicit Volitional Swallow: Unable to elicit    Oral/Motor/Sensory Function Overall Oral Motor/Sensory Function: Mild impairment Facial Symmetry: Abnormal symmetry left;Suspected CN VII (facial) dysfunction   Ice Chips Ice chips: Impaired Presentation: Spoon Oral Phase Impairments: Poor awareness of bolus   Thin Liquid Thin Liquid: Impaired Presentation: Spoon Oral Phase Impairments: Reduced lingual movement/coordination Oral Phase Functional Implications: Left anterior spillage;Right anterior spillage    Nectar Thick Nectar Thick Liquid: Not tested   Honey Thick Honey Thick Liquid: Not tested   Puree Puree: Not tested   Solid     Solid: Not tested      Lauren [name] 01/10/2022,2:14  PM Estill Bamberg L. Tivis Ringer, MA CCC/SLP Clinical Specialist - [place] Office number [phone]

## 2022-01-10 NOTE — Progress Notes (Signed)
OT Cancellation Note  Patient Details Name: Lauren [name] MRN: [id number] DOB: October 05, 1980   Cancelled Treatment:    Reason Eval/Treat Not Completed: Active bedrest order (OT evalutation to f/u as activity orders progress)  Cachet Mccutchen A Inaki Vantine 01/10/2022, 1:03 PM

## 2022-01-10 NOTE — Progress Notes (Addendum)
NAME:  Lauren [name], MRN:  [id number], DOB:  [birth year], LOS: 2 ADMISSION DATE:  01/08/2022, CONSULTATION DATE:  01/08/2022 REFERRING MD:  Dr Curly [place], CHIEF COMPLAINT:  stroke   History of Present Illness:  41 yo female with known CAD, HFrEF, DM type II c/b neuropathy, stage III CKD, peripheral vascular disease admitted with R MCA stroke and vertebral artery stroke   Patient presented to the ED with left sided facial droop, left sided weakness, and slurred speech.  LKW: unknown.  Code stroke initiated. NIHSS: 8.  Imaging revealed: age indeterminate occlusion of proximal right ICA, V3 segment of right vertebral artery, left paraclinoid ICA stenosis. No core infarct or penumbra. Patient went immediately to IR for thrombectomy.    Of note, patient with recent arterial thrombectomy of the right upper extremity due to limb ischemia.  She has had URI x 2 days with recent sick contact.   COVID neg   Pertinent  Medical History   Past Medical History:  Diagnosis Date   Anemia    CAD (coronary artery disease)    a. s/p cath in 03/2014 showing 30% mid-LAD, moderate to severe disease along small D1, patent LCx, moderate to severe distal OM2 stenosis and moderate diffuse diease along RCA not amenable to PCI   CHF (congestive heart failure) ([place])    a. EF 55-60% in 12/2019 b. EF at 35-40% by echo in 05/2020   Diabetes mellitus without complication ([place])    Myocardial infarction ([place])    Neuropathy    Renal disorder    stage 3 kidney disease   Stroke ([place])     Significant Hospital Events: Including procedures, antibiotic start and stop dates in addition to other pertinent events   Admitted 9/8, s/p tubectomy 9/8-mechanical ventilation  Interim History / Subjective:  Awake and interactive this morning Spontaneously moving right upper and lower extremities Weaning well Objective   Blood pressure 121/65, pulse (!) 110, temperature 99 F (37.2 C), temperature source Oral, resp. rate (!) 22, height  5\' 5"  (1.651 m), weight 94.8 kg, last menstrual period 11/01/2021, SpO2 100 %.    Vent Mode: CPAP FiO2 (%):  [40 %] 40 % Set Rate:  [25 bmp] 25 bmp Vt Set:  [450 mL] 450 mL PEEP:  [8 cmH20] 8 cmH20 Pressure Support:  [5 cmH20] 5 cmH20 Plateau Pressure:  [18 cmH20-20 cmH20] 18 cmH20   Intake/Output Summary (Last 24 hours) at 01/10/2022 0826 Last data filed at 01/10/2022 0700 Gross per 24 hour  Intake 2701.98 ml  Output 292 ml  Net 2409.98 ml   Filed Weights   01/08/22 1329 01/09/22 0500 01/10/22 0500  Weight: 91 kg 94.9 kg 94.8 kg    Examination: General: Middle-age does not appear to be in distress  HENT: Endotracheal tube in place  lungs: Clear breath sounds Cardiovascular: S1-S2 appreciated Abdomen: Bowel sounds appreciated Extremities: No clubbing, no edema Neuro: Awake and interactive GU:   Chest x-ray-no acute infiltrate ABG: 7.39/31/131 BUN 41, creatinine 4.17 Hemoglobin 7.3, hematocrit of 21.6  Resolved Hospital Problem list     Assessment & Plan:   Metabolic acidosis Acute kidney injury on chronic kidney disease Related to DKA -DKA has resolved -Continue to monitor electrolytes - on bicarb drip at 50  Anemia -Due to blood loss -Will transfuse 1 unit packed red cells  Right MCA and right vertebral stroke s/p thrombectomy, right ICA dissection repair, stent placement -Not a candidate for thrombolytics, was on Eliquis -Postprocedural CT head negative for bleed -Goal blood  pressure systolic 622-297 -Patient did receive cangrelor  Acute hypoxemic respiratory failure -No underlying chronic lung disease -Recently had URI symptoms  Chronic systolic and diastolic heart failure Last echocardiogram with left ventricular ejection fraction of 40 to 98% grade 2 diastolic dysfunction -We will continue support  History of coronary artery disease -We will continue Brilinta  History of hypothyroidism -We will reinitiate Synthroid  History of right hand  ischemia status post arterial thrombectomy in July 2023  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 -Follows up with podiatry -Plans for metatarsal head resection and proximal phalangeal resection  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 -Continue SSI  Stage III chronic kidney disease with acute kidney injury -Avoid nephrotoxic's -No acute need for CRRT -We will involve renal  Patient was on Augmentin chronically for a foot infection -We will continue   Best Practice (right click and "Reselect all SmartList Selections" daily)   Diet/type: NPO DVT prophylaxis: SCD GI prophylaxis: PPI Lines: Central line Foley:  Yes, and it is still needed Code Status:  full code Last date of multidisciplinary goals of care discussion [pending]  Labs   CBC: Recent Labs  Lab 01/08/22 1326 01/08/22 1345 01/08/22 1940 01/08/22 2049 01/09/22 0101 01/09/22 0503 01/10/22 0522  WBC 10.0  --  13.8*  --   --  26.5* 19.4*  NEUTROABS 7.3  --   --   --   --  26.0*  --   HGB 7.6*   < > 5.6* 5.8* 7.5* 9.0* 7.3*  HCT 23.9*   < > 17.9* 17.0* 22.0* 25.8* 21.6*  MCV 91.9  --  92.3  --   --  89.0 89.6  PLT 399  --  431*  --   --  327 315   < > = values in this interval not displayed.    Basic Metabolic Panel: Recent Labs  Lab 01/08/22 2014 01/08/22 2049 01/09/22 0503 01/09/22 1425 01/09/22 1818 01/09/22 2331 01/10/22 0522  NA 135   < > 138 139 139 139 139  K 5.8*   < > 3.9 4.5 4.3 4.3 4.0  CL 113*  --  113* 112* 108 111 107  CO2 14*  --  17* 16* 14* 18* 18*  GLUCOSE 483*  --  182* 179* 302* 246* 141*  BUN 28*  --  32* 35* 38* 38* 41*  CREATININE 2.15*  --  2.50* 3.07* 3.63* 3.80* 4.17*  CALCIUM 7.0*  --  7.1* 7.3* 7.1* 7.0* 7.2*  MG 1.9  --  1.8  --   --   --  1.9  PHOS 5.0*  --  3.8  --   --   --  5.7*   < > = values in this interval not displayed.   GFR: Estimated Creatinine Clearance: 20.6 mL/min (A) (by C-G formula based on SCr of 4.17 mg/dL (H)). Recent Labs   Lab 01/08/22 1326 01/08/22 1940 01/09/22 0214 01/09/22 0503 01/10/22 0522  WBC 10.0 13.8*  --  26.5* 19.4*  LATICACIDVEN  --   --  1.9  --   --     Liver Function Tests: Recent Labs  Lab 01/08/22 1326 01/08/22 2014  AST 18 34  ALT 14 19  ALKPHOS 73 67  BILITOT 0.4 0.5  PROT 5.6* 4.4*  ALBUMIN 2.6* 2.1*   No results for input(s): "LIPASE", "AMYLASE" in the last 168 hours. No results for input(s): "AMMONIA" in the last 168 hours.  ABG    Component Value Date/Time  PHART 7.39 01/09/2022 0740   PCO2ART 31 (L) 01/09/2022 0740   PO2ART 131 (H) 01/09/2022 0740   HCO3 18.8 (L) 01/09/2022 0740   TCO2 13 (L) 01/09/2022 0101   ACIDBASEDEF 5.5 (H) 01/09/2022 0740   O2SAT 99.6 01/09/2022 0740     Coagulation Profile: Recent Labs  Lab 01/08/22 1326  INR 1.5*    Cardiac Enzymes: No results for input(s): "CKTOTAL", "CKMB", "CKMBINDEX", "TROPONINI" in the last 168 hours.  HbA1C: HbA1c POC (<> result, manual entry)  Date/Time Value Ref Range Status  09/29/2021 08:47 AM 9.6 4.0 - 5.6 % Final   Hgb A1c MFr Bld  Date/Time Value Ref Range Status  01/09/2022 02:25 PM 8.0 (H) 4.8 - 5.6 % Final    Comment:    (NOTE) Pre diabetes:          5.7%-6.4%  Diabetes:              >6.4%  Glycemic control for   <7.0% adults with diabetes   06/19/2021 04:07 AM 9.8 (H) 4.8 - 5.6 % Final    Comment:    (NOTE) Pre diabetes:          5.7%-6.4%  Diabetes:              >6.4%  Glycemic control for   <7.0% adults with diabetes     CBG: Recent Labs  Lab 01/09/22 1259 01/09/22 1706 01/09/22 2051 01/09/22 2329 01/10/22 0354  GLUCAP 113* 279* 288* 253* 193*    Review of Systems:   Unable to provide history  Past Medical History:  She,  has a past medical history of Anemia, CAD (coronary artery disease), CHF (congestive heart failure) ([place]), Diabetes mellitus without complication ([place]), Myocardial infarction ([place]), Neuropathy, Renal disorder, and Stroke ([place]).    Surgical History:   Past Surgical History:  Procedure Laterality Date   AMPUTATION Left 09/02/2021   Procedure: AMPUTATION RAY;  Surgeon: Criselda Peaches, DPM;  Location: [place];  Service: Podiatry;  Laterality: Left;  sagittal saw, 3L bag saline & Pulse   Cardiac catherization     CHOLECYSTECTOMY     I & D EXTREMITY Left 08/30/2021   Procedure: IRRIGATION AND DEBRIDEMENT WITH BONE BIOPSY;  Surgeon: [name], DPM;  Location: [place];  Service: Podiatry;  Laterality: Left;   IRRIGATION AND DEBRIDEMENT FOOT Left 09/04/2021   Procedure: IRRIGATION AND DEBRIDEMENT FOOT AND CLOSURE;  Surgeon: Criselda Peaches, DPM;  Location: [place];  Service: Podiatry;  Laterality: Left;   RADIOLOGY WITH ANESTHESIA N/A 01/08/2022   Procedure: IR WITH ANESTHESIA;  Surgeon: Radiologist, Medication, MD;  Location: [place];  Service: Radiology;  Laterality: N/A;   THROMBECTOMY BRACHIAL ARTERY Right 11/18/2021   Procedure: RIGHT BRACHIAL, RADIAL, & ULNAR ARTERY THROMBECTOMY.;  Surgeon: Marty Heck, MD;  Location: [place];  Service: Vascular;  Laterality: Right;   TUBAL LIGATION       Social History:   reports that she quit smoking about 14 months ago. Her smoking use included cigarettes. She smoked an average of .25 packs per day. She has never used smokeless tobacco. She reports that she does not drink alcohol and does not use drugs.   Family History:  Her family history includes Breast cancer (age of onset: 73) in her paternal grandmother; CAD in her father and mother; CVA in her mother; Cancer in her paternal grandmother; Hyperlipidemia in her father and mother; Hypertension in her father and mother; Thyroid disease in her mother.   Allergies  Allergies  Allergen Reactions   Wellbutrin [Bupropion] Hives   Cefepime Rash    Tolerates penicilllin   Ciprofloxacin Hcl Hives and Rash    Hives/rash at injection site    Tape Rash    The patient is critically ill with multiple organ systems failure and  requires high complexity decision making for assessment and support, frequent evaluation and titration of therapies, application of advanced monitoring technologies and extensive interpretation of multiple databases. Critical Care Time devoted to patient care services described in this note independent of APP/resident time (if applicable)  is 32 minutes.   Sherrilyn Rist MD [facility] Personal pager: See Amion If unanswered, please page CCM On-call: [phone]

## 2022-01-10 NOTE — Consult Note (Addendum)
Renal Service Consult Note [facility]  Lauren [name] 01/10/2022 Sol Blazing, MD Requesting Physician: Dr. Ander Slade  Reason for Consult: Renal failure HPI: The patient is a 41 y.o. year-old w/ hx of CKD 4, CAD sp MI, DM2 who presented on 9/08 w/ stroke-like symptoms. 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 Pt went immediately to IR for thrombectomy. Creat was 2.0 on admission, close to her baseline. Today creat is up to 4.1. We are asked to see for renal failure.   Pt seen in ICU, just extubated this am. Not responding verbally, sitting up in bed. Not able to provide any hx. Husband at bedside. Not sure who her renal MD is.   ROS - n/a   Past Medical History  Past Medical History:  Diagnosis Date   Anemia    CAD (coronary artery disease)    a. s/p cath in 03/2014 showing 30% mid-LAD, moderate to severe disease along small D1, patent LCx, moderate to severe distal OM2 stenosis and moderate diffuse diease along RCA not amenable to PCI   CHF (congestive heart failure) ([place])    a. EF 55-60% in 12/2019 b. EF at 35-40% by echo in 05/2020   Diabetes mellitus without complication ([place])    Myocardial infarction ([place])    Neuropathy    Renal disorder    stage 3 kidney disease   Stroke [facility])    Past Surgical History  Past Surgical History:  Procedure Laterality Date   AMPUTATION Left 09/02/2021   Procedure: AMPUTATION RAY;  Surgeon: Criselda Peaches, DPM;  Location: [place];  Service: Podiatry;  Laterality: Left;  sagittal saw, 3L bag saline & Pulse   Cardiac catherization     CHOLECYSTECTOMY     I & D EXTREMITY Left 08/30/2021   Procedure: IRRIGATION AND DEBRIDEMENT WITH BONE BIOPSY;  Surgeon: [name], DPM;  Location: [place];  Service: Podiatry;  Laterality: Left;   IRRIGATION AND DEBRIDEMENT FOOT Left 09/04/2021   Procedure: IRRIGATION AND DEBRIDEMENT FOOT AND CLOSURE;  Surgeon: Criselda Peaches, DPM;  Location: [place];  Service: Podiatry;  Laterality:  Left;   RADIOLOGY WITH ANESTHESIA N/A 01/08/2022   Procedure: IR WITH ANESTHESIA;  Surgeon: Radiologist, Medication, MD;  Location: [place];  Service: Radiology;  Laterality: N/A;   THROMBECTOMY BRACHIAL ARTERY Right 11/18/2021   Procedure: RIGHT BRACHIAL, RADIAL, & ULNAR ARTERY THROMBECTOMY.;  Surgeon: Marty Heck, MD;  Location: [facility];  Service: Vascular;  Laterality: Right;   TUBAL LIGATION     Family History  Family History  Problem Relation Age of Onset   Thyroid disease Mother    Hypertension Mother    Hyperlipidemia Mother    CAD Mother    CVA Mother    Hyperlipidemia Father    Hypertension Father    CAD Father    Breast cancer Paternal Grandmother 22   Cancer Paternal Grandmother        lung   Social History  reports that she quit smoking about 14 months ago. Her smoking use included cigarettes. She smoked an average of .25 packs per day. She has never used smokeless tobacco. She reports that she does not drink alcohol and does not use drugs. Allergies  Allergies  Allergen Reactions   Wellbutrin [Bupropion] Hives   Cefepime Rash    Tolerates penicilllin   Ciprofloxacin Hcl Hives and Rash    Hives/rash at injection site    Tape Rash   Home medications Prior to Admission medications  Medication Sig Start Date End Date Taking? Authorizing Provider  amoxicillin-clavulanate (AUGMENTIN) 875-125 MG tablet Take 1 tablet by mouth 2 (two) times daily for 14 days. 12/28/21 01/11/22  Criselda Peaches, DPM  apixaban (ELIQUIS) 5 MG TABS tablet Take 1 tablet by mouth twice daily 12/17/21   Marty Heck, MD  Apixaban Starter Pack, 54m and 516m (ELIQUIS DVT/PE STARTER PACK) Take as directed on package: start with two-45m50mablets twice daily for 7 days. On day 8, switch to one-45mg21mblet twice daily. (Take two tabs (10mg79mice daily through 7/27, then on 7/28 reduce to 1 tab (45mg) 10mce daily.) Patient not taking: Reported on 12/22/2021 12/17/21   Clark,Marty Heck atorvastatin (LIPITOR) 40 MG tablet TAKE 1 TABLET BY MOUTH ONCE DAILY. Patient taking differently: Take 40 mg by mouth daily. 09/24/21   Barrett, RhondaEvelene Croon  clopidogrel (PLAVIX) 75 MG tablet Take 1 tablet (75 mg total) by mouth daily. 09/24/21   Barrett, RhondaEvelene Croon  Continuous Blood Gluc Sensor (DEXCOM G6 SENSOR) MISC Change sensor every 10 days as directed 09/29/21   ReardoBrita RompContinuous Blood Gluc Transmit (DEXCOM G6 TRANSMITTER) MISC Change transmitter every 90 days as directed. 09/29/21   ReardoBrita Rompfenofibrate 160 MG tablet TAKE 1 TABLET BY MOUTH ONCE DAILY. Patient taking differently: Take 160 mg by mouth daily. 10/01/20   BranchArnoldo Lenisfurosemide (LASIX) 40 MG tablet Take 1 tablet (40 mg total) by mouth daily. Take daily or as directed Patient taking differently: Take 40 mg by mouth daily as needed for fluid or edema. 09/24/21   Barrett, RhondaEvelene Croon  hydrALAZINE (APRESOLINE) 25 MG tablet Take 1 & 1/2 tablets (37.5 mg total) by mouth 3 (three) times daily. Patient taking differently: Take 25 mg by mouth 3 (three) times daily. 02/19/21   BranchArnoldo LenisInsulin Disposable Pump (OMNIPOD 5 G6 INTRO, GEN 5,) KIT Change pod every 48-72 hrs 10/19/21   ReardoBrita RompInsulin Disposable Pump (OMNIPOD 5 G6 POD, GEN 5,) MISC Change pod every 48-72 hours 10/19/21   ReardoBrita Rompisosorbide mononitrate (IMDUR) 30 MG 24 hr tablet Take 1 tablet (30 mg total) by mouth daily. 09/24/21 09/19/22  Barrett, RhondaEvelene Croon  levothyroxine (SYNTHROID) 175 MCG tablet Take 175 mcg by mouth daily before breakfast.    [provider]  metoprolol succinate (TOPROL-XL) 25 MG 24 hr tablet Take 1 tablet (25 mg total) by mouth daily. 09/24/21   Barrett, RhondaEvelene Croon  nitroGLYCERIN (NITROSTAT) 0.4 MG SL tablet Place 1 tablet (0.4 mg total) under the tongue every 5 (five) minutes x 3 doses as needed for chest pain. 09/24/21   Barrett, RhondaEvelene Croon   oxyCODONE (OXY IR/ROXICODONE) 5 MG immediate release tablet Take 1 tablet (5 mg total) by mouth every 6 (six) hours as needed for moderate pain. 11/21/21   Lama, Oswald Hillockpantoprazole (PROTONIX) 40 MG tablet Take 1 tablet (40 mg total) by mouth daily as needed for acid reflux Patient taking differently: Take 40 mg by mouth daily as needed (heartburn). 11/26/20     potassium chloride SA (KLOR-CON M) 20 MEQ tablet Take 1 tablet (20 mEq total) by mouth daily 09/24/21   Barrett, RhondaEvelene Croon  predniSONE (DELTASONE) 5 MG tablet 6 day taper; Take as directed on written instructions provided during visit Patient not taking: Reported on 12/22/2021 12/11/21  Mar Daring, [place]     Vitals:   01/10/22 0900 01/10/22 1000 01/10/22 1100 01/10/22 1130  BP: 125/66 131/73 137/72   Pulse: (!) 101 (!) 104 97   Resp: 19 19 (!) 25   Temp:   98.9 F (37.2 C) 98.4 F (36.9 C)  TempSrc:   Axillary Axillary  SpO2: 97% 98% 100%   Weight:      Height:       Exam Gen no distress, not responding to voice 3L [place] Just extubated this am No rash, cyanosis or gangrene Sclera anicteric, throat clear  No jvd or bruits Chest clear bilat to bases, no rales/ wheezing RRR no MRG Abd soft ntnd no mass or ascites +bs GU foley cath draining clear light yellow urine small amts MS L foot wrapped Ext 1+ bilat early hip/ LE/ pretib edema  Neuro is sitting up in bed, not following commands, just extubated     Home meds include - statin, synthroid, prns   Date   Creat  eGFR    2009- 2106  0.6- 1.20     2017- 18  1.16- 1.31              2021   1.62- 2.25   2022   1.94- 2.21  Feb- may '23  2.19- 3.05 November 2021  2.16- 2.33 27- 29 ml/min, ckd 4              Dec 12, 2021  2.03  01/08/22   2.00         9/9   2.50, 3.63    9/10   4.17   I/O 8.1 L in and 1.0 L UOP = + 7.1L net Na 139  K 4  CO2 18  BUN 41  creat 4.17  phos 5.7  Ca 7.2  WBC 19  Alb 2.1, LFT's okay, Hb 7.3  plt wnl BP's labile,  mostly wnl 105- 140/ 65- 85 CXR serial films showing acute bilat perihilar edema (9/8, 9/9) improving on the last film yest 9/09 UA - 9/9 > prot >300, 0-5 rbc, 6-10 wbc, no bact  Assessment/ Plan: AKI on CKD IV - b/l creat 2.1- 2.3 from July 2023, eGFR 27-29 ml/min. Not sure who her renal MD is. Creat here was 2.0 on admission for acute CVA. Pt went for thrombectomy 9/8 per IR w/ stent placement. Pt got > 1 IV contrast doses on 9/8 for diagnostic CT scans. Creat now up to 4.1 today. BP's mostly normal, no sig hypotension. UA unremarkable. On exam has some swelling of UE/ LEs, is 7 liters+, suspect vol overload. AKI due to contrast nephropathy most likely. Will dc IVF"s and give IV lasix today 48m x 2. Foley cath, renal [place] urine lytes. Pt is poorly responsive but doubt uremia yet at her age. Will follow closely.  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 Improving w/ f/u CXR yest. DC'd ivf's. Extubated this am.  DM2 - per pmd CAD hx of MI      Rob Marino Rogerson  MD 01/10/2022, 12:35 PM Recent Labs  Lab 01/08/22 1326 01/08/22 1345 01/08/22 2014 01/08/22 2049 01/09/22 0503 01/09/22 1425 01/09/22 2331 01/10/22 0522  HGB 7.6*   < >  --    < > 9.0*  --   --  7.3*  ALBUMIN 2.6*  --  2.1*  --   --   --   --   --   CALCIUM 8.2*  --  7.0*  --  7.1*   < > 7.0* 7.2*  PHOS  --    < > 5.0*  --  3.8  --   --  5.7*  CREATININE 1.94*   < > 2.15*  --  2.50*   < > 3.80* 4.17*  K 4.1   < > 5.8*   < > 3.9   < > 4.3 4.0   < > = values in this interval not displayed.

## 2022-01-10 NOTE — Progress Notes (Signed)
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 She is sitting up in bed with eyes intermittently open. Her husband is at the bedside.   Allergies: Wellbutrin [bupropion], Cefepime, Ciprofloxacin hcl, and Tape  Medications: Prior to Admission medications   Medication Sig Start Date End Date Taking? Authorizing Provider  amoxicillin-clavulanate (AUGMENTIN) 875-125 MG tablet Take 1 tablet by mouth 2 (two) times daily for 14 days. 12/28/21 01/11/22  Criselda Peaches, DPM  apixaban (ELIQUIS) 5 MG TABS tablet Take 1 tablet by mouth twice daily 12/17/21   Marty Heck, MD  Apixaban Starter Pack, 40m and 556m (ELIQUIS DVT/PE STARTER PACK) Take as directed on package: start with two-16m61mablets twice daily for 7 days. On day 8, switch to one-16mg72mblet twice daily. (Take two tabs (10mg60mice daily through 7/27, then on 7/28 reduce to 1 tab (16mg) 1mce daily.) Patient not taking: Reported on 12/22/2021 12/17/21   Clark,Marty Heckatorvastatin (LIPITOR) 40 MG tablet TAKE 1 TABLET BY MOUTH ONCE DAILY. Patient taking differently: Take 40 mg by mouth daily. 09/24/21   Barrett, RhondaEvelene Croon  clopidogrel (PLAVIX) 75 MG tablet Take 1 tablet (75 mg total) by mouth daily. 09/24/21   Barrett, RhondaEvelene Croon  Continuous Blood Gluc Sensor (DEXCOM G6 SENSOR) MISC Change sensor every 10 days as directed 09/29/21   ReardoBrita RompContinuous Blood Gluc Transmit (DEXCOM G6 TRANSMITTER) MISC Change transmitter every 90 days as directed. 09/29/21   ReardoBrita Rompfenofibrate 160 MG tablet TAKE 1 TABLET BY MOUTH ONCE DAILY. Patient taking differently: Take 160 mg by mouth daily. 10/01/20   BranchArnoldo Lenisfurosemide (LASIX) 40 MG tablet  Take 1 tablet (40 mg total) by mouth daily. Take daily or as directed Patient taking differently: Take 40 mg by mouth daily as needed for fluid or edema. 09/24/21   Barrett, RhondaEvelene Croon  hydrALAZINE (APRESOLINE) 25 MG tablet Take 1 & 1/2 tablets (37.5 mg total) by mouth 3 (three) times daily. Patient taking differently: Take 25 mg by mouth 3 (three) times daily. 02/19/21   BranchArnoldo LenisInsulin Disposable Pump (OMNIPOD 5 G6 INTRO, GEN 5,) KIT Change pod every 48-72 hrs 10/19/21   ReardoBrita RompInsulin Disposable Pump (OMNIPOD 5 G6 POD, GEN 5,) MISC Change pod every 48-72 hours 10/19/21   ReardoBrita Rompisosorbide mononitrate (IMDUR) 30 MG 24 hr tablet Take 1 tablet (30 mg total) by mouth daily. 09/24/21 09/19/22  Barrett, RhondaEvelene Croon  levothyroxine (SYNTHROID) 175 MCG tablet Take 175 mcg by mouth daily before breakfast.    [provider]  metoprolol succinate (TOPROL-XL) 25 MG 24 hr tablet Take 1 tablet (25 mg total) by mouth daily. 09/24/21   Barrett, RhondaEvelene Croon  nitroGLYCERIN (NITROSTAT) 0.4 MG SL tablet Place 1 tablet (0.4 mg total) under the tongue every 5 (five) minutes x 3 doses as needed for chest pain. 09/24/21   Barrett, RhondaEvelene Croon  oxyCODONE (OXY IR/ROXICODONE) 5 MG immediate release tablet Take 1 tablet (5 mg total) by mouth every 6 (six) hours as needed for moderate pain. 11/21/21   Lama, Oswald Hillockpantoprazole (PROTONIX) 40 MG tablet Take 1  tablet (40 mg total) by mouth daily as needed for acid reflux Patient taking differently: Take 40 mg by mouth daily as needed (heartburn). 11/26/20     potassium chloride SA (KLOR-CON M) 20 MEQ tablet Take 1 tablet (20 mEq total) by mouth daily 09/24/21   Barrett, Evelene Croon, PA-C  predniSONE (DELTASONE) 5 MG tablet 6 day taper; Take as directed on written instructions provided during visit Patient not taking: Reported on 12/22/2021 12/11/21   Mar Daring, PA-C     Vital Signs: BP 121/65   Pulse  (!) 110   Temp 99 F (37.2 C) (Oral)   Resp (!) 22   Ht 5' 5"  (1.651 m)   Wt 208 lb 15.9 oz (94.8 kg)   LMP 11/01/2021 (Approximate)   SpO2 100%   BMI 34.78 kg/m   Physical Exam Constitutional:      General: She is not in acute distress.    Comments: Eyes intermittently opening.   Cardiovascular:     Comments: Right groin vascular site clean, soft, dry Pulmonary:     Effort: Pulmonary effort is normal.     Comments: Recently extubated. Now on nasal cannula.  Skin:    General: Skin is warm and dry.  Neurological:     Comments: Currently not following any commands. Prior to extubation she was following commands per bedside RN.      Imaging: MR ANGIO HEAD WO CONTRAST  Result Date: 01/09/2022 CLINICAL DATA:  Status post revascularization of occluded extracranial ICA. EXAM: MRA HEAD WITHOUT CONTRAST TECHNIQUE: Angiographic images of the Circle of Willis were acquired using MRA technique without intravenous contrast. COMPARISON:  Cerebral arteriogram 01/08/2022 FINDINGS: Anterior circulation: Segmental signal is present in the high cervical segment of the ICA. No signal is visualized in the petrous are pre cavernous right ICA. Flow signal is present in the cavernous and supraclinoid right ICA as well as the posterior communicating artery. More normal flow is present in the high left cervical ICA. Signal loss in the supraclinoid left ICA may be artifactual. ICA terminus signal is within normal limits. The left A1 segment is dominant. Left M1 segment is normal. Flow is present in the right M1 segment. The MCA branch vessels are intact bilaterally vocal robust on the left. ACA branch vessels are within normal limits bilaterally. Posterior circulation: The left vertebral artery is the dominant vessel. No flow is present in the right vertebral artery below the PICA. The basilar artery is normal. The left posterior cerebral artery originates from the basilar tip. The right posterior cerebral artery  is of fetal type. The right P1 segment is present. PCA branch vessels are within normal limits bilaterally. Anatomic variants: Fetal type right posterior cerebral artery. Other: None. IMPRESSION: 1. Abnormal flow signal within the petrous and pre cavernous right ICA may represent artifact from stent or slow flow 2. Signal loss in the supraclinoid left ICA may be artifactual. 3. Asymmetric diffused decreased size of right MCA branch vessels compared to the left likely reflects decreased perfusion. No significant proximal stenosis or occlusion. Electronically Signed   By: San Morelle M.D.   On: 01/09/2022 17:17   MR BRAIN WO CONTRAST  Result Date: 01/09/2022 CLINICAL DATA:  Status post revascularization of occluded cervical ICA. EXAM: MRI HEAD WITHOUT CONTRAST TECHNIQUE: Multiplanar, multiecho pulse sequences of the brain and surrounding structures were obtained without intravenous contrast. COMPARISON:  CT head without contrast 01/08/2022 FINDINGS: Brain: Diffusion-weighted images demonstrate extensive patchy restricted diffusion throughout the MCA territory. Most  confluent areas are along the watershed. There is significant sparing of cortex within the right MCA territory although scattered foci of acute infarct are noted. T2 and FLAIR hyperintensities are associated with the areas of acute infarction. Most consistent cortical infarct is in the anterior right frontal lobe along the watershed. Focal nonhemorrhagic infarct is also present within the genu of the corpus callosum and more posteriorly along the body of the corpus callosum on the right. No acute hemorrhage is present. White matter changes extend into the brainstem. The internal auditory canals are within normal limits. Cerebellum is within normal limits. Vascular: Intravascular contrast is present in the left internal carotid artery but not the right. This likely represents T1 shortening from height noted contrast. Branch vessels are seen in  the [place] territory and left MCA territory but not on the right. Flow is present in the posterior circulation. Skull and upper cervical spine: The craniocervical junction is normal. Upper cervical spine is within normal limits. Marrow signal is unremarkable. Sinuses/Orbits: Fluid levels are present the maxillary sinuses and throughout the ethmoid air cells. Mild mucosal thickening is present in the frontal sinuses bilaterally. Fluid levels are present in the sphenoid sinuses. Small mastoid effusions are present. The globes and orbits are within normal limits. IMPRESSION: 1. Extensive patchy areas of acute nonhemorrhagic infarction involving the right MCA territory, most confluent areas along the watershed. 2. Focal nonhemorrhagic infarct involving the genu of the corpus callosum and more posteriorly along the body of the corpus callosum on the right. 3. Diffuse sinus disease is likely secondary to intubation. Electronically Signed   By: San Morelle M.D.   On: 01/09/2022 17:09   DG Abd Portable 1V  Result Date: 01/09/2022 CLINICAL DATA:  Orogastric tube placement EXAM: PORTABLE ABDOMEN - 1 VIEW COMPARISON:  CT abdomen pelvis 06/23/2014 FINDINGS: Orogastric tube terminates in the region of the gastric antrum. Mild left basilar atelectasis. No dilated loops of bowel identified within the visualized upper abdomen. Central venous catheter tip located at the cavoatrial junction. IMPRESSION: Orogastric tube terminates in the region of the gastric antrum. Electronically Signed   By: Miachel Roux M.D.   On: 01/09/2022 10:07   DG Chest Port 1 View  Result Date: 01/09/2022 CLINICAL DATA:  Endotracheal tube placement. EXAM: PORTABLE CHEST 1 VIEW COMPARISON:  01/09/2022 earlier. FINDINGS: Endotracheal tube has tip 4.7 cm above the carina. Left IJ central venous catheter unchanged with tip at the cavoatrial junction. Lungs are hypoinflated with persistent hazy right perihilar/paramediastinal opacification and mild  prominence of the central pulmonary vasculature. Stable mild opacification in the left base/retrocardiac region. No pneumothorax. Cardiomediastinal silhouette and remainder of the exam is unchanged. IMPRESSION: 1. Stable hazy opacification over the right perihilar/paramediastinal region and left base/retrocardiac region. Findings may be due to asymmetric edema versus infection. Suggestion of mild vascular congestion. 2. Tubes and lines as described. Electronically Signed   By: Marin Olp M.D.   On: 01/09/2022 09:21   DG CHEST PORT 1 VIEW  Result Date: 01/09/2022 CLINICAL DATA:  Status post intubation. EXAM: PORTABLE CHEST 1 VIEW COMPARISON:  Chest radiograph dated 01/08/2021. FINDINGS: Interval retraction of the endotracheal tube with tip now approximately 4 cm above the carina. Left IJ central venous line in similar position. Similar or slightly worsened scratch pulmonary hazy and streaky densities, likely combination of atelectasis and vascular congestion and edema. Pneumonia is not excluded. Trace left pleural effusion suspected. No pneumothorax. No acute osseous pathology. IMPRESSION: Interval retraction of the endotracheal tube with tip  now approximately 4 cm above the carina. Similar or slightly worsened pulmonary opacities in the right upper lobe compared to the earlier radiograph. Electronically Signed   By: Anner Crete M.D.   On: 01/09/2022 01:04   CT HEAD WO CONTRAST (5MM)  Result Date: 01/08/2022 CLINICAL DATA:  Stroke follow-up EXAM: CT HEAD WITHOUT CONTRAST TECHNIQUE: Contiguous axial images were obtained from the base of the skull through the vertex without intravenous contrast. RADIATION DOSE REDUCTION: This exam was performed according to the departmental dose-optimization program which includes automated exposure control, adjustment of the mA and/or kV according to patient size and/or use of iterative reconstruction technique. COMPARISON:  01/08/2022 CT head FINDINGS: Brain:  Redemonstrated hypodensity in the right frontal periventricular white matter. No additional hypodensity. No evidence of acute hemorrhage, mass, mass effect, or midline shift. No hydrocephalus or extra-axial fluid collection. Vascular: No hyperdense vessel. Skull: Normal. Negative for fracture or focal lesion. Sinuses/Orbits: Mucosal thickening throughout the paranasal sinuses, with air-fluid levels with bubbly fluid in the maxillary sinuses and right sphenoid sinus. Fluid in the nasopharynx is likely related to intubation. The orbits are unremarkable. Other: The mastoid air cells are well aerated. IMPRESSION: 1. Redemonstrated hypodensity in the right frontal periventricular white matter. No additional acute intracranial process. No additional acute infarct or hemorrhage. 2. Air-fluid levels with bubbly fluid in the maxillary sinuses and right sphenoid sinus are likely related to intubation but can be seen in the setting of sinusitis. Electronically Signed   By: Merilyn Baba M.D.   On: 01/08/2022 21:57   DG Chest Portable 1 View  Result Date: 01/08/2022 CLINICAL DATA:  Chest pain. EXAM: PORTABLE CHEST 1 VIEW COMPARISON:  Chest radiograph dated 06/18/2020. FINDINGS: Endotracheal tube with tip just above the carina. Recommend retraction by 3 cm for optimal positioning. Left IJ central venous line with tip at the cavoatrial junction. There is shallow inspiration. There is cardiomegaly with vascular congestion and edema. Pneumonia is not excluded. Clinical correlation is recommended. Probable trace left pleural effusion. No pneumothorax. No acute osseous pathology. IMPRESSION: 1. Endotracheal tube with tip just above the carina. Recommend retraction by 3 cm for optimal positioning. 2. Cardiomegaly with vascular congestion and edema. Pneumonia is not excluded. Electronically Signed   By: Anner Crete M.D.   On: 01/08/2022 20:16   CT ANGIO HEAD NECK W WO CM (CODE STROKE)  Result Date: 01/08/2022 CLINICAL DATA:   Neuro deficit, acute, stroke suspected; [id number] EXAM: CT ANGIOGRAPHY HEAD AND NECK CT PERFUSION BRAIN TECHNIQUE: Multidetector CT imaging of the head and neck was performed using the standard protocol during bolus administration of intravenous contrast. Multiplanar CT image reconstructions and MIPs were obtained to evaluate the vascular anatomy. Carotid stenosis measurements (when applicable) are obtained utilizing NASCET criteria, using the distal internal carotid diameter as the denominator. Multiphase CT imaging of the brain was performed following IV bolus contrast injection. Subsequent parametric perfusion maps were calculated using RAPID software. RADIATION DOSE REDUCTION: This exam was performed according to the departmental dose-optimization program which includes automated exposure control, adjustment of the mA and/or kV according to patient size and/or use of iterative reconstruction technique. CONTRAST:  47m OMNIPAQUE IOHEXOL 350 MG/ML SOLN COMPARISON:  CT head from the same day. FINDINGS: CTA NECK FINDINGS Aortic arch: Great vessel origins are patent. Right carotid system: Common carotid artery is patent without significant stenosis. The ICA is occluded proximally with non opacification of the remainder of the neck. Left carotid system: Mild atherosclerosis at the carotid bifurcation without significant (  greater than 50%) stenosis. Vertebral arteries: Small/non dominant right vertebral artery, which is irregular throughout its V2 segment and occluded at the V3 segment with non opacification of the proximal intradural vertebral artery and reconstitution of the distal intradural vertebral artery. Dominant left intradural vertebral artery is patent throughout the neck without significant) greater than 50%) stenosis. Skeleton: No acute findings. Other neck: No acute findings Upper chest: Moderate layering bilateral pleural effusions. Peribronchial thickening. Review of the MIP images confirms the above  findings CTA HEAD FINDINGS Anterior circulation: Non-opacified right intracranial ICA. Small but opacified right M1 MCA and proximal right M2 MCA branches. Severe left paraclinoid ICA stenosis. Left MCA and ACAs are patent. Posterior circulation: Proximal right intradural vertebral artery is not opacified. Distal intradural vertebral artery is opacified, likely from collaterals and retrograde flow. Left intradural vertebral artery, basilar artery and bilateral posterior cerebral arteries are patent without proximal hemodynamically significant stenosis. Venous sinuses: As permitted by contrast timing, patent. Review of the MIP images confirms the above findings CT Brain Perfusion Findings: ASPECTS: 10. CBF (<30%) Volume: 35m Perfusion (Tmax>6.0s) volume: 046mPerfusion (Tmax>4.0s) volume: 38 mL Mismatch Volume: 48m66mnfarction Location:None identified. IMPRESSION: 1. 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 Right MCA and A1 ACA are patent, but diminutive. 2. Small/non dominant right vertebral artery, which is irregular throughout its V2 segment with age indeterminate occlusion at the V3 segment with non opacification of the proximal intradural vertebral artery and reconstitution of the distal intradural vertebral artery. 3. Severe left paraclinoid ICA stenosis. 4. One CT perfusion, no evidence of core infarct or penumbra; however, there is approximately 38 mL of T-max greater than 4 seconds in the right MCA territory which suggests possible oligemia. An MRI could provide more sensitive evaluation for acute infarct. 5. Moderate layering bilateral pleural effusions and peribronchial wall thickening. Recommend dedicated chest imaging. Code stroke imaging results were communicated on 01/08/2022 at 2:16 pm to provider Bhagat via telephone, who verbally acknowledged these results. Electronically Signed   By: FreMargaretha SheffieldD.    On: 01/08/2022 14:19   CT CEREBRAL PERFUSION W CONTRAST  Result Date: 01/08/2022 CLINICAL DATA:  Neuro deficit, acute, stroke suspected; [id number]: CT ANGIOGRAPHY HEAD AND NECK CT PERFUSION BRAIN TECHNIQUE: Multidetector CT imaging of the head and neck was performed using the standard protocol during bolus administration of intravenous contrast. Multiplanar CT image reconstructions and MIPs were obtained to evaluate the vascular anatomy. Carotid stenosis measurements (when applicable) are obtained utilizing NASCET criteria, using the distal internal carotid diameter as the denominator. Multiphase CT imaging of the brain was performed following IV bolus contrast injection. Subsequent parametric perfusion maps were calculated using RAPID software. RADIATION DOSE REDUCTION: This exam was performed according to the departmental dose-optimization program which includes automated exposure control, adjustment of the mA and/or kV according to patient size and/or use of iterative reconstruction technique. CONTRAST:  548m103mNIPAQUE IOHEXOL 350 MG/ML SOLN COMPARISON:  CT head from the same day. FINDINGS: CTA NECK FINDINGS Aortic arch: Great vessel origins are patent. Right carotid system: Common carotid artery is patent without significant stenosis. The ICA is occluded proximally with non opacification of the remainder of the neck. Left carotid system: Mild atherosclerosis at the carotid bifurcation without significant (greater than 50%) stenosis. Vertebral arteries: Small/non dominant right vertebral artery, which is irregular throughout its V2 segment and occluded at the V3 segment with non opacification of the proximal intradural vertebral  artery and reconstitution of the distal intradural vertebral artery. Dominant left intradural vertebral artery is patent throughout the neck without significant) greater than 50%) stenosis. Skeleton: No acute findings. Other neck: No acute findings Upper chest: Moderate layering  bilateral pleural effusions. Peribronchial thickening. Review of the MIP images confirms the above findings CTA HEAD FINDINGS Anterior circulation: Non-opacified right intracranial ICA. Small but opacified right M1 MCA and proximal right M2 MCA branches. Severe left paraclinoid ICA stenosis. Left MCA and ACAs are patent. Posterior circulation: Proximal right intradural vertebral artery is not opacified. Distal intradural vertebral artery is opacified, likely from collaterals and retrograde flow. Left intradural vertebral artery, basilar artery and bilateral posterior cerebral arteries are patent without proximal hemodynamically significant stenosis. Venous sinuses: As permitted by contrast timing, patent. Review of the MIP images confirms the above findings CT Brain Perfusion Findings: ASPECTS: 10. CBF (<30%) Volume: 889m Perfusion (Tmax>6.0s) volume: 038mPerfusion (Tmax>4.0s) volume: 38 mL Mismatch Volume: 89m2mnfarction Location:None identified. IMPRESSION: 1. 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 Right MCA and A1 ACA are patent, but diminutive. 2. Small/non dominant right vertebral artery, which is irregular throughout its V2 segment with age indeterminate occlusion at the V3 segment with non opacification of the proximal intradural vertebral artery and reconstitution of the distal intradural vertebral artery. 3. Severe left paraclinoid ICA stenosis. 4. One CT perfusion, no evidence of core infarct or penumbra; however, there is approximately 38 mL of T-max greater than 4 seconds in the right MCA territory which suggests possible oligemia. An MRI could provide more sensitive evaluation for acute infarct. 5. Moderate layering bilateral pleural effusions and peribronchial wall thickening. Recommend dedicated chest imaging. Code stroke imaging results were communicated on 01/08/2022 at 2:16 pm to provider Bhagat via  telephone, who verbally acknowledged these results. Electronically Signed   By: FreMargaretha SheffieldD.   On: 01/08/2022 14:19   CT HEAD CODE STROKE WO CONTRAST  Result Date: 01/08/2022 CLINICAL DATA:  Code stroke.  Left arm weakness.  Left facial droop. EXAM: CT HEAD WITHOUT CONTRAST TECHNIQUE: Contiguous axial images were obtained from the base of the skull through the vertex without intravenous contrast. RADIATION DOSE REDUCTION: This exam was performed according to the departmental dose-optimization program which includes automated exposure control, adjustment of the mA and/or kV according to patient size and/or use of iterative reconstruction technique. COMPARISON:  CT head without contrast 12/12/2021 FINDINGS: Brain: Periventricular white matter hypodensities adjacent to the frontal horn of the right lateral ventricles and in the right corona radiata are new since prior exam. No acute or focal cortical abnormality is present. Basal ganglia are intact. Insular ribbon is normal. The ventricles are of normal size. No significant extraaxial fluid collection is present. The brainstem and cerebellum are within normal limits. Vascular: Atherosclerotic calcifications are again seen within the cavernous internal carotid arteries. No hyperdense vessel is present. No significant interval change is present. Skull: Calvarium is intact. No focal lytic or blastic lesions are present. No significant extracranial soft tissue lesion is present. Sinuses/Orbits: Mild mucosal thickening is present maxillary sinuses bilaterally. The paranasal sinuses and mastoid air cells are otherwise clear. The globes and orbits are within normal limits. ASPECTS ([facility]) - Ganglionic level infarction (caudate, lentiform nuclei, internal capsule, insula, M1-M3 cortex): 7/7 - Supraganglionic infarction (M4-M6 cortex): 3/3 Total score (0-10 with 10 being normal): 10/10 IMPRESSION: 1. Periventricular white matter  hypodensities adjacent  to the frontal horn of the right lateral ventricles and in the right corona radiata are new since prior exam. This may represent acute ischemia. 2. No acute or focal cortical abnormality. 3. ASPECTS is 10/10. The above was relayed via text pager to Dr. Curly [place] on 01/08/2022 at 13:39 . Electronically Signed   By: San Morelle M.D.   On: 01/08/2022 13:41    Labs:  CBC: Recent Labs    01/08/22 1326 01/08/22 1345 01/08/22 1940 01/08/22 2049 01/09/22 0101 01/09/22 0503 01/10/22 0522  WBC 10.0  --  13.8*  --   --  26.5* 19.4*  HGB 7.6*   < > 5.6* 5.8* 7.5* 9.0* 7.3*  HCT 23.9*   < > 17.9* 17.0* 22.0* 25.8* 21.6*  PLT 399  --  431*  --   --  327 315   < > = values in this interval not displayed.    COAGS: Recent Labs    08/28/21 0001 12/12/21 0000 01/08/22 1326  INR 1.1 1.0 1.5*  APTT 32 30 36    BMP: Recent Labs    01/09/22 1425 01/09/22 1818 01/09/22 2331 01/10/22 0522  NA 139 139 139 139  K 4.5 4.3 4.3 4.0  CL 112* 108 111 107  CO2 16* 14* 18* 18*  GLUCOSE 179* 302* 246* 141*  BUN 35* 38* 38* 41*  CALCIUM 7.3* 7.1* 7.0* 7.2*  CREATININE 3.07* 3.63* 3.80* 4.17*  GFRNONAA 19* 16* 15* 13*    LIVER FUNCTION TESTS: Recent Labs    11/18/21 0555 11/30/21 1016 12/12/21 0000 01/08/22 1326 01/08/22 2014  BILITOT 1.0  --  0.3 0.4 0.5  AST 14*  --  16 18 34  ALT 15  --  19 14 19   ALKPHOS 105  --  80 73 67  PROT 7.3  --  5.6* 5.6* 4.4*  ALBUMIN 3.1* 3.2* 2.7* 2.6* 2.1*    Assessment and Plan: 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 Estanislado Pandy  Recently extubated, not currently following any commands possibly due to fatigue. RN reports prior to extubation patient was following commands. Patient still not demonstrating any movement on the left side of her body.   Today's doses of aspirin and Brilinta were administered via OG tube prior to extubation. One unit PRBC  ordered by CCM for anemia.   NIR will continue to follow.   Electronically Signed: Soyla Dryer, AGACNP-BC [phone] 01/10/2022, 8:22 AM   I spent a total of 15 Minutes at the the patient's bedside AND on the patient's hospital floor or unit, greater than 50% of which was counseling/coordinating care for post-stroke intervention.

## 2022-01-10 NOTE — Progress Notes (Addendum)
STROKE TEAM PROGRESS NOTE   INTERVAL HISTORY RN at bedside. Husband at the bedside. Patient has been extubated this am. Not speaking, will moan to questions. Follows commands on the right. Right gaze preference, does not cross midline, left facial droop, tongue midline. PERRL, very sluggish bilaterally, No blink to threat on left. Moves right side spontaneously. Left side is flaccid. Sensation seems to be intact bilaterally to noxious stimuli, with grimacing.  MRI with extensive patchy acute right MCA infarct.   CBC:  Recent Labs  Lab 01/08/22 1326 01/08/22 1345 01/09/22 0503 01/10/22 0522  WBC 10.0   < > 26.5* 19.4*  NEUTROABS 7.3  --  26.0*  --   HGB 7.6*   < > 9.0* 7.3*  HCT 23.9*   < > 25.8* 21.6*  MCV 91.9   < > 89.0 89.6  PLT 399   < > 327 315   < > = values in this interval not displayed.    Basic Metabolic Panel:  Recent Labs  Lab 01/09/22 0503 01/09/22 1425 01/09/22 2331 01/10/22 0522  NA 138   < > 139 139  K 3.9   < > 4.3 4.0  CL 113*   < > 111 107  CO2 17*   < > 18* 18*  GLUCOSE 182*   < > 246* 141*  BUN 32*   < > 38* 41*  CREATININE 2.50*   < > 3.80* 4.17*  CALCIUM 7.1*   < > 7.0* 7.2*  MG 1.8  --   --  1.9  PHOS 3.8  --   --  5.7*   < > = values in this interval not displayed.    Lipid Panel:  Recent Labs  Lab 01/09/22 0503  CHOL 85  TRIG 154*  156*  HDL 15*  CHOLHDL 5.7  VLDL 31  LDLCALC 39    HgbA1c:  Recent Labs  Lab 01/09/22 1425  HGBA1C 8.0*   Urine Drug Screen:  Recent Labs  Lab 01/09/22 0225  LABOPIA NONE DETECTED  COCAINSCRNUR NONE DETECTED  LABBENZ NONE DETECTED  AMPHETMU NONE DETECTED  THCU NONE DETECTED  LABBARB NONE DETECTED     Alcohol Level  Recent Labs  Lab 01/08/22 1339  ETH <10     IMAGING past 24 hours MR ANGIO HEAD WO CONTRAST  Result Date: 01/09/2022 CLINICAL DATA:  Status post revascularization of occluded extracranial ICA. EXAM: MRA HEAD WITHOUT CONTRAST TECHNIQUE: Angiographic images of the Circle  of Willis were acquired using MRA technique without intravenous contrast. COMPARISON:  Cerebral arteriogram 01/08/2022 FINDINGS: Anterior circulation: Segmental signal is present in the high cervical segment of the ICA. No signal is visualized in the petrous are pre cavernous right ICA. Flow signal is present in the cavernous and supraclinoid right ICA as well as the posterior communicating artery. More normal flow is present in the high left cervical ICA. Signal loss in the supraclinoid left ICA may be artifactual. ICA terminus signal is within normal limits. The left A1 segment is dominant. Left M1 segment is normal. Flow is present in the right M1 segment. The MCA branch vessels are intact bilaterally vocal robust on the left. ACA branch vessels are within normal limits bilaterally. Posterior circulation: The left vertebral artery is the dominant vessel. No flow is present in the right vertebral artery below the PICA. The basilar artery is normal. The left posterior cerebral artery originates from the basilar tip. The right posterior cerebral artery is of fetal type. The right P1 segment  is present. PCA branch vessels are within normal limits bilaterally. Anatomic variants: Fetal type right posterior cerebral artery. Other: None. IMPRESSION: 1. Abnormal flow signal within the petrous and pre cavernous right ICA may represent artifact from stent or slow flow 2. Signal loss in the supraclinoid left ICA may be artifactual. 3. Asymmetric diffused decreased size of right MCA branch vessels compared to the left likely reflects decreased perfusion. No significant proximal stenosis or occlusion. Electronically Signed   By: San Morelle M.D.   On: 01/09/2022 17:17   MR BRAIN WO CONTRAST  Result Date: 01/09/2022 CLINICAL DATA:  Status post revascularization of occluded cervical ICA. EXAM: MRI HEAD WITHOUT CONTRAST TECHNIQUE: Multiplanar, multiecho pulse sequences of the brain and surrounding structures were  obtained without intravenous contrast. COMPARISON:  CT head without contrast 01/08/2022 FINDINGS: Brain: Diffusion-weighted images demonstrate extensive patchy restricted diffusion throughout the MCA territory. Most confluent areas are along the watershed. There is significant sparing of cortex within the right MCA territory although scattered foci of acute infarct are noted. T2 and FLAIR hyperintensities are associated with the areas of acute infarction. Most consistent cortical infarct is in the anterior right frontal lobe along the watershed. Focal nonhemorrhagic infarct is also present within the genu of the corpus callosum and more posteriorly along the body of the corpus callosum on the right. No acute hemorrhage is present. White matter changes extend into the brainstem. The internal auditory canals are within normal limits. Cerebellum is within normal limits. Vascular: Intravascular contrast is present in the left internal carotid artery but not the right. This likely represents T1 shortening from height noted contrast. Branch vessels are seen in the [place] territory and left MCA territory but not on the right. Flow is present in the posterior circulation. Skull and upper cervical spine: The craniocervical junction is normal. Upper cervical spine is within normal limits. Marrow signal is unremarkable. Sinuses/Orbits: Fluid levels are present the maxillary sinuses and throughout the ethmoid air cells. Mild mucosal thickening is present in the frontal sinuses bilaterally. Fluid levels are present in the sphenoid sinuses. Small mastoid effusions are present. The globes and orbits are within normal limits. IMPRESSION: 1. Extensive patchy areas of acute nonhemorrhagic infarction involving the right MCA territory, most confluent areas along the watershed. 2. Focal nonhemorrhagic infarct involving the genu of the corpus callosum and more posteriorly along the body of the corpus callosum on the right. 3. Diffuse sinus  disease is likely secondary to intubation. Electronically Signed   By: San Morelle M.D.   On: 01/09/2022 17:09   DG Abd Portable 1V  Result Date: 01/09/2022 CLINICAL DATA:  Orogastric tube placement EXAM: PORTABLE ABDOMEN - 1 VIEW COMPARISON:  CT abdomen pelvis 06/23/2014 FINDINGS: Orogastric tube terminates in the region of the gastric antrum. Mild left basilar atelectasis. No dilated loops of bowel identified within the visualized upper abdomen. Central venous catheter tip located at the cavoatrial junction. IMPRESSION: Orogastric tube terminates in the region of the gastric antrum. Electronically Signed   By: Miachel Roux M.D.   On: 01/09/2022 10:07    PHYSICAL EXAM  Temp:  [99 F (37.2 C)-99.5 F (37.5 C)] 99 F (37.2 C) (09/10 0800) Pulse Rate:  [74-114] 110 (09/10 0800) Resp:  [20-30] 22 (09/10 0800) BP: (55-132)/(42-90) 121/65 (09/10 0800) SpO2:  [95 %-100 %] 100 % (09/10 0800) Arterial Line BP: (94-152)/(42-91) 138/67 (09/10 0800) FiO2 (%):  [40 %] 40 % (09/10 0759) Weight:  [94.8 kg] 94.8 kg (09/10 0500)  General -  Critically ill female. Well nourished, well developed, in no apparent distress. Cardiovascular - Regular rhythm and rate.  Mental Status -  Not speaking, will moan to questions. Follows commands on the right. Right gaze preference, does not cross midline, left facial droop, tongue midline. PERRL, very sluggish bilaterally, No blink to threat on left.  Motor Strength - Left side is flaccid. Grimaces to noxious stimuli on left. Right side moves spontaneously and purposeful. Motor Tone - Muscle tone was assessed at the neck and appendages and was normal.   Sensory - appears to be intact to noxious stimuli  Coordination - Unable to assess  Gait and Station - deferred.   ASSESSMENT/PLAN [name] is a 41 y.o. female with history of coronary artery disease with myocardial infarction, heart failure with preserved EF, HTN, uncontrolled DM, hx of  strokes, hypothyroidism, right arm ischemia,  on Eliquis diabetes complicated by neuropathy, CKD stage III, left foot diabetic ulcer s/p amputation of the fifth toe and now with concern for osteomyelitis of the left fourth metatarsal, who presented to the ED for acute onset of chest pain with Left side weakness noted at triage. NIHSS of 8. Patient not a TPA candidate, underwent IR thrombectomy of R ICA  with R ICA dissection repair and stent placement with TICI 3  Stroke:  Acute Right MCA and vertebral stroke s/p thrombectomy with TICI 3 with right ICA dissection repair and stent placement  Etiology:  unclear, likely cardioembolic  Code Stroke CT head 1. Periventricular white matter hypodensities adjacent to the frontal horn of the right lateral ventricles and in the right corona radiata are new since prior exam. This may represent acute ischemia. 2. No acute or focal cortical abnormality. 3. ASPECTS is 10/10 CTA head & neck with CT perfusion  1. 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 Right MCA and A1 ACA are patent, but diminutive. 2. Small/non dominant right vertebral artery, which is irregular throughout its V2 segment with age indeterminate occlusion at the V3 segment with non opacification of the proximal intradural vertebral artery and reconstitution of the distal intradural vertebral artery. 3. Severe left paraclinoid ICA stenosis. 4. One CT perfusion, no evidence of core infarct or penumbra; however, there is approximately 38 mL of T-max greater than 4 seconds in the right MCA territory which suggests possible oligemia. An MRI could provide more sensitive evaluation for acute infarct. 5. Moderate layering bilateral pleural effusions and peribronchial wall thickening. Recommend dedicated chest imaging  Cerebral angio  Occluded right internal carotid artery extracranially and intracranially to  the supraclinoid region Status post endovascular complete revascularization of occluded right internal carotid artery proximally and distally intracranially with 1 pass with a 4 mm x 40 mm solitary extragenital device and proximal aspiration, 1 pass with a 5 mm x 37 mm amber trap retrieval device and aspiration proximally, and 1 pass with a 6.5 mm x 45 mm ambulatory with proximal aspiration achieving a complete revascularization of the internal carotid artery.   Status post reconstruction of symptomatic occlusive long segment dissection extending from the proximal one third of the right internal carotid artery to the petrous cavernous junction using 3 pipeline flow diverter's and a 4 mm x 24 mm Neuroform Atlas stent in a  telescope fashion.  Final arteriogram from the right common carotid artery demonstrates complete revascularization without any filling defects with opacification of the right middle cerebral artery distribution maintaining aTICI  3 revascularization.  Post IR CT  1. Redemonstrated hypodensity in the right frontal periventricular white matter. No additional acute intracranial process. No additional acute infarct or hemorrhage. 2. Air-fluid levels with bubbly fluid in the maxillary sinuses and right sphenoid sinus are likely related to intubation but can be seen in the setting of sinusitis  MRI   1. Extensive patchy areas of acute nonhemorrhagic infarction involving the right MCA territory, most confluent areas along the watershed. 2. Focal nonhemorrhagic infarct involving the genu of the corpus callosum and more posteriorly along the body of the corpus callosum on the right. 3. Diffuse sinus disease is likely secondary to intubation MRA   1. Abnormal flow signal within the petrous and pre cavernous right ICA may represent artifact from stent or slow flow 2. Signal loss in the supraclinoid left ICA may be artifactual. 3. Asymmetric diffused decreased size of right MCA branch  vessels compared to the left likely reflects decreased perfusion. No significant proximal stenosis or occlusion  2D Echo pending LDL 39 HgbA1c 9.6 VTE prophylaxis - SCD's    Diet   Diet NPO time specified   Eliquis (apixaban) daily prior to admission, now on aspirin 81 mg daily and Brilinta (ticagrelor) 90 mg bid.  Therapy recommendations:  pending Disposition:  pending  Hypertension Home meds:  hydralazine, metoprolol, isosorbide Stable Long-term BP goal normotensive  Hyperlipidemia Home meds:  fenofibrate, not resumed in hospital LDL 39, goal < 70 Started on atorvastatin. Continue at discharge   Diabetes type II UnControlled Metabolic Acidosis  DKA Home meds:  insulin HgbA1c 9.6, goal < 7.0 CBGs Recent Labs    01/09/22 2329 01/10/22 0354 01/10/22 0843  GLUCAP 253* 193* 94     insulin gtt currently off Correct electrolyte derangements SSI Needs close follow up with PCP to help manage DM   Other Stroke Risk Factors  Former Cigarette smoker advised to stop smoking Obesity, Body mass index is 34.78 kg/m., BMI >/= 30 associated with increased stroke risk, recommend weight loss, diet and exercise as appropriate  Hx stroke/TIA Coronary artery disease Congestive heart failure  Other Active Problems Acute hypoxic respiratory Failure, resolved  On ventilator managed by CCM Off prop Extubated this am   Chronic Combined HF Echo pending  Meds on hold currently  Hypothyroidism  Home synthroid   AKI on CKD  IVF  Worsening Cr 2.50-3.07-3.63-3.80-4.17  Osteomyelitis  left foot from diabetic ulcer  On augmentin  Following with podiatry   Anemia  Hgb 7.3, transfusing PRBC   Leukocytosis Improving 26.5-19.4 onnABX as above   Dysphagia  ST eval May need NG tube    Hospital day # 2  Beulah Gandy DNP, ACNPC-AG    ATTENDING ATTESTATION:   41 year old status post thrombectomy for right ICA occlusion resulting in ICA dissection and repair with  stent placement.  She has a history of osteomyelitis in the left foot on Augmentin.  Doing better today she was extubated this morning.  Off insulin drip.  She is sleepy but arousable.  She is not able to speak.  She is able to follow commands.  Dense left hemiplegia.  Spoke with Dr. Ander Slade. Appreciate his assistance.  Continue aspirin and Brilinta due to stent placement.  MRI echo pending   Dr. Reeves Forth evaluated pt independently, reviewed imaging, chart, labs. Discussed and formulated plan with the APP. Please see APP note above for details.     This patient is critically ill due to respiratory distress, possible sepsis/osteo, and  stroke s/p  thrombectomy and at significant risk of neurological worsening, death form heart failure, respiratory failure, recurrent stroke, bleeding from [facility], seizure, sepsis. This patient's care requires constant monitoring of vital signs, hemodynamics, respiratory and cardiac monitoring, review of multiple databases, neurological assessment, discussion with family, other specialists and medical decision making of high complexity. I spent 35 minutes of neurocritical care time in the care of this patient.    Hazell Siwik,MD

## 2022-01-11 ENCOUNTER — Inpatient Hospital Stay (HOSPITAL_COMMUNITY): Payer: No Typology Code available for payment source

## 2022-01-11 ENCOUNTER — Encounter (HOSPITAL_COMMUNITY): Payer: Self-pay | Admitting: Neurology

## 2022-01-11 ENCOUNTER — Encounter: Payer: No Typology Code available for payment source | Admitting: Podiatry

## 2022-01-11 ENCOUNTER — Ambulatory Visit: Payer: Self-pay | Admitting: *Deleted

## 2022-01-11 DIAGNOSIS — I255 Ischemic cardiomyopathy: Secondary | ICD-10-CM | POA: Diagnosis not present

## 2022-01-11 DIAGNOSIS — I2510 Atherosclerotic heart disease of native coronary artery without angina pectoris: Secondary | ICD-10-CM

## 2022-01-11 DIAGNOSIS — I6521 Occlusion and stenosis of right carotid artery: Secondary | ICD-10-CM | POA: Diagnosis not present

## 2022-01-11 LAB — TROPONIN I (HIGH SENSITIVITY): Troponin I (High Sensitivity): 3869 ng/L (ref <18)

## 2022-01-11 LAB — PHOSPHORUS
Phosphorus: 5.7 mg/dL — ABNORMAL HIGH (ref 2.5–4.6)
Phosphorus: 6 mg/dL — ABNORMAL HIGH (ref 2.5–4.6)
Phosphorus: 5.5 mg/dL — ABNORMAL HIGH (ref 2.5–4.6)

## 2022-01-11 LAB — CBC
HCT: 24.2 % — ABNORMAL LOW (ref 36.0–46.0)
Hemoglobin: 8.2 g/dL — ABNORMAL LOW (ref 12.0–15.0)
MCH: 30.4 pg (ref 26.0–34.0)
MCHC: 33.9 g/dL (ref 30.0–36.0)
MCV: 89.6 fL (ref 80.0–100.0)
Platelets: 317 10*3/uL (ref 150–400)
RBC: 2.7 MIL/uL — ABNORMAL LOW (ref 3.87–5.11)
RDW: 14.3 % (ref 11.5–15.5)
WBC: 17.3 10*3/uL — ABNORMAL HIGH (ref 4.0–10.5)
nRBC: 0 % (ref 0.0–0.2)

## 2022-01-11 LAB — GLUCOSE, CAPILLARY
Glucose-Capillary: 105 mg/dL — ABNORMAL HIGH (ref 70–99)
Glucose-Capillary: 149 mg/dL — ABNORMAL HIGH (ref 70–99)
Glucose-Capillary: 181 mg/dL — ABNORMAL HIGH (ref 70–99)
Glucose-Capillary: 282 mg/dL — ABNORMAL HIGH (ref 70–99)
Glucose-Capillary: 310 mg/dL — ABNORMAL HIGH (ref 70–99)
Glucose-Capillary: 88 mg/dL (ref 70–99)

## 2022-01-11 LAB — CULTURE, RESPIRATORY W GRAM STAIN
Culture: NORMAL
Gram Stain: NONE SEEN

## 2022-01-11 LAB — BASIC METABOLIC PANEL
Anion gap: 12 (ref 5–15)
BUN: 42 mg/dL — ABNORMAL HIGH (ref 6–20)
CO2: 20 mmol/L — ABNORMAL LOW (ref 22–32)
Calcium: 8.1 mg/dL — ABNORMAL LOW (ref 8.9–10.3)
Chloride: 109 mmol/L (ref 98–111)
Creatinine, Ser: 4.16 mg/dL — ABNORMAL HIGH (ref 0.44–1.00)
GFR, Estimated: 13 mL/min — ABNORMAL LOW (ref >60)
Glucose, Bld: 82 mg/dL (ref 70–99)
Potassium: 3.5 mmol/L (ref 3.5–5.1)
Sodium: 141 mmol/L (ref 135–145)

## 2022-01-11 LAB — MAGNESIUM
Magnesium: 1.9 mg/dL (ref 1.7–2.4)
Magnesium: 1.9 mg/dL (ref 1.7–2.4)
Magnesium: 1.9 mg/dL (ref 1.7–2.4)

## 2022-01-11 MED ORDER — CHLORHEXIDINE GLUCONATE CLOTH 2 % EX PADS
6.0000 | MEDICATED_PAD | Freq: Every day | CUTANEOUS | Status: DC
  Administered 2022-01-12 – 2022-01-28 (×18): 6 via TOPICAL

## 2022-01-11 MED ORDER — METHYLPREDNISOLONE SODIUM SUCC 40 MG IJ SOLR
40.0000 mg | Freq: Every day | INTRAMUSCULAR | Status: DC
Start: 2022-01-12 — End: 2022-01-17
  Administered 2022-01-12 – 2022-01-16 (×4): 40 mg via INTRAVENOUS
  Filled 2022-01-11 (×4): qty 1

## 2022-01-11 MED ORDER — FUROSEMIDE 10 MG/ML IJ SOLN
60.0000 mg | Freq: Once | INTRAMUSCULAR | Status: AC
  Administered 2022-01-11: 60 mg via INTRAVENOUS
  Filled 2022-01-11: qty 6

## 2022-01-11 MED ORDER — VITAL HIGH PROTEIN PO LIQD
1000.0000 mL | ORAL | Status: DC
  Administered 2022-01-11: 1000 mL

## 2022-01-11 MED ORDER — MORPHINE SULFATE (PF) 2 MG/ML IV SOLN
0.5000 mg | Freq: Once | INTRAVENOUS | Status: AC
  Administered 2022-01-11: 0.5 mg via INTRAVENOUS
  Filled 2022-01-11: qty 1

## 2022-01-11 MED ORDER — ARFORMOTEROL TARTRATE 15 MCG/2ML IN NEBU
15.0000 ug | INHALATION_SOLUTION | Freq: Two times a day (BID) | RESPIRATORY_TRACT | Status: DC
  Administered 2022-01-11 – 2022-01-28 (×35): 15 ug via RESPIRATORY_TRACT
  Filled 2022-01-11 (×35): qty 2

## 2022-01-11 MED ORDER — NICOTINE 21 MG/24HR TD PT24
21.0000 mg | MEDICATED_PATCH | Freq: Every day | TRANSDERMAL | Status: DC
Start: 2022-01-11 — End: 2022-01-28
  Administered 2022-01-11 – 2022-01-28 (×17): 21 mg via TRANSDERMAL
  Filled 2022-01-11 (×17): qty 1

## 2022-01-11 MED ORDER — REVEFENACIN 175 MCG/3ML IN SOLN
175.0000 ug | Freq: Every day | RESPIRATORY_TRACT | Status: DC
Start: 2022-01-11 — End: 2022-01-28
  Administered 2022-01-11 – 2022-01-28 (×18): 175 ug via RESPIRATORY_TRACT
  Filled 2022-01-11 (×18): qty 3

## 2022-01-11 MED ORDER — PROSOURCE TF20 ENFIT COMPATIBL EN LIQD
60.0000 mL | Freq: Every day | ENTERAL | Status: DC
  Administered 2022-01-11 – 2022-01-17 (×6): 60 mL
  Filled 2022-01-11 (×6): qty 60

## 2022-01-11 MED ORDER — METHYLPREDNISOLONE SODIUM SUCC 125 MG IJ SOLR
80.0000 mg | INTRAMUSCULAR | Status: AC
  Administered 2022-01-11: 80 mg via INTRAVENOUS
  Filled 2022-01-11: qty 2

## 2022-01-11 MED ORDER — BUDESONIDE 0.5 MG/2ML IN SUSP
0.5000 mg | Freq: Two times a day (BID) | RESPIRATORY_TRACT | Status: DC
  Administered 2022-01-11 – 2022-01-28 (×35): 0.5 mg via RESPIRATORY_TRACT
  Filled 2022-01-11 (×35): qty 2

## 2022-01-11 MED ORDER — HEPARIN (PORCINE) 25000 UT/250ML-% IV SOLN
1400.0000 [IU]/h | INTRAVENOUS | Status: DC
  Administered 2022-01-12: 900 [IU]/h via INTRAVENOUS
  Administered 2022-01-12: 1150 [IU]/h via INTRAVENOUS
  Administered 2022-01-13: 1400 [IU]/h via INTRAVENOUS
  Administered 2022-01-15: 1500 [IU]/h via INTRAVENOUS
  Administered 2022-01-15: 1400 [IU]/h via INTRAVENOUS
  Administered 2022-01-16 – 2022-01-17 (×2): 1500 [IU]/h via INTRAVENOUS
  Administered 2022-01-18 – 2022-01-19 (×3): 1550 [IU]/h via INTRAVENOUS
  Administered 2022-01-20: 1500 [IU]/h via INTRAVENOUS
  Administered 2022-01-21: 1400 [IU]/h via INTRAVENOUS
  Filled 2022-01-11 (×14): qty 250

## 2022-01-11 MED ORDER — INSULIN ASPART 100 UNIT/ML IJ SOLN
2.0000 [IU] | INTRAMUSCULAR | Status: DC
  Administered 2022-01-11 – 2022-01-14 (×15): 2 [IU] via SUBCUTANEOUS

## 2022-01-11 NOTE — Progress Notes (Signed)
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 Per husband patient intermittently able to make needs known with hand squeezes, appears frustrated when he cannot understand what she is indicating. She has not verbalized any words to him or staff, however he did note that she shook her head no in response to a question he asked her this morning.   Patient noted to be tachypneic overnight. Per CCM evaluation patient indicates chest discomfort and squeezes hand yes when asked if she is short of breath. RR 40s with paradoxical breathing noted on exam this morning. Patient to remain NPO per SLP evaluation yesterday.  Allergies: Wellbutrin [bupropion], Cefepime, Ciprofloxacin hcl, and Tape  Medications: Prior to Admission medications   Medication Sig Start Date End Date Taking? Authorizing Provider  amoxicillin-clavulanate (AUGMENTIN) 875-125 MG tablet Take 1 tablet by mouth 2 (two) times daily for 14 days. 12/28/21 01/11/22  Criselda Peaches, DPM  apixaban (ELIQUIS) 5 MG TABS tablet Take 1 tablet by mouth twice daily 12/17/21   Marty Heck, MD  Apixaban Starter Pack, 73m and 572m (ELIQUIS DVT/PE STARTER PACK) Take as directed on package: start with two-110m29mablets twice daily for 7 days. On day 8, switch to one-110mg6110mblet twice daily. (Take two tabs (10mg6110mice daily through 7/27, then on 7/28 reduce to 1 tab (110mg) 39mce daily.) Patient not taking: Reported on 12/22/2021 12/17/21   Clark,Marty Heckatorvastatin (LIPITOR) 40 MG tablet TAKE 1 TABLET BY MOUTH ONCE DAILY. Patient taking differently: Take 40 mg by mouth daily. 09/24/21   Barrett, RhondaEvelene Croon  clopidogrel (PLAVIX) 75 MG tablet Take 1 tablet (75 mg total) by mouth daily. 09/24/21    Barrett, RhondaEvelene Croon  Continuous Blood Gluc Sensor (DEXCOM G6 SENSOR) MISC Change sensor every 10 days as directed 09/29/21   ReardoBrita RompContinuous Blood Gluc Transmit (DEXCOM G6 TRANSMITTER) MISC Change transmitter every 90 days as directed. 09/29/21   ReardoBrita Rompfenofibrate 160 MG tablet TAKE 1 TABLET BY MOUTH ONCE DAILY. Patient taking differently: Take 160 mg by mouth daily. 10/01/20   BranchArnoldo Lenisfurosemide (LASIX) 40 MG tablet Take 1 tablet (40 mg total) by mouth daily. Take daily or as directed Patient taking differently: Take 40 mg by mouth daily as needed for fluid or edema. 09/24/21   Barrett, RhondaEvelene Croon  hydrALAZINE (APRESOLINE) 25 MG tablet Take 1 & 1/2 tablets (37.5 mg total) by mouth 3 (three) times daily. Patient taking differently: Take 25 mg by mouth 3 (three) times daily. 02/19/21   BranchArnoldo LenisInsulin Disposable Pump (OMNIPOD 5 G6 INTRO, GEN 5,) KIT Change pod every 48-72 hrs 10/19/21   ReardoBrita RompInsulin Disposable Pump (OMNIPOD 5 G6 POD, GEN 5,) MISC Change pod every 48-72 hours 10/19/21   ReardoBrita Rompisosorbide mononitrate (IMDUR) 30 MG 24 hr tablet Take 1 tablet (30 mg total) by mouth daily. 09/24/21 09/19/22  Barrett, RhondaEvelene Croon  levothyroxine (SYNTHROID) 175 MCG tablet Take 175 mcg by mouth daily before breakfast.    [provider]  metoprolol succinate (TOPROL-XL) 25 MG 24 hr tablet Take 1 tablet (25 mg total) by mouth daily. 09/24/21   Barrett, RhondaSuanne Marker  G, PA-C  nitroGLYCERIN (NITROSTAT) 0.4 MG SL tablet Place 1 tablet (0.4 mg total) under the tongue every 5 (five) minutes x 3 doses as needed for chest pain. 09/24/21   Barrett, Evelene Croon, PA-C  oxyCODONE (OXY IR/ROXICODONE) 5 MG immediate release tablet Take 1 tablet (5 mg total) by mouth every 6 (six) hours as needed for moderate pain. 11/21/21   Oswald Hillock, MD  pantoprazole (PROTONIX) 40 MG tablet Take 1 tablet (40 mg total) by mouth daily  as needed for acid reflux Patient taking differently: Take 40 mg by mouth daily as needed (heartburn). 11/26/20     potassium chloride SA (KLOR-CON M) 20 MEQ tablet Take 1 tablet (20 mEq total) by mouth daily 09/24/21   Barrett, Evelene Croon, PA-C  predniSONE (DELTASONE) 5 MG tablet 6 day taper; Take as directed on written instructions provided during visit Patient not taking: Reported on 12/22/2021 12/11/21   Mar Daring, PA-C     Vital Signs: BP (!) 156/92   Pulse (!) 106   Temp 97.7 F (36.5 C) (Axillary)   Resp (!) 31   Ht 5' 5"  (1.651 m)   Wt 210 lb 5.1 oz (95.4 kg)   LMP 11/01/2021 (Approximate)   SpO2 94%   BMI 35.00 kg/m   Physical Exam Vitals and nursing note reviewed.  Constitutional:      General: She is in acute distress.  HENT:     Head: Normocephalic.  Cardiovascular:     Rate and Rhythm: Tachycardia present.  Pulmonary:     Comments: Tachypneic Paradoxical breathing Abdominal:     Comments: (+) NG with bilious appearing fluid in tube Wall suction canister has small amount of bloody fluid which RN states is not from today and is likely from when she was still intubated. (+) abdominal waist belt in place, loosened by RN during exam  Genitourinary:    Comments: (+) foley draining clear yellow urine Skin:    General: Skin is warm and dry.  Neurological:     Mental Status: She is alert.   Alert, awake Apraxia of speech, follows some simple commands, squeezes hand yes/no to some questions, appears to try and mouth words at one point Does not track examiner reliably, able to make intermittent eye contact - particularly with her husband. ?right gaze preference ?left facial droop Tongue midline - unable to assess, patient does not follow commands reliably Motor power - moves right side spontaneously, does not move left side. Right hand with safety mitten present, waving right hand at times.   Imaging: DG Abd Portable 1V  Result Date: 01/10/2022 CLINICAL  DATA:  NG tube placement EXAM: PORTABLE ABDOMEN - 1 VIEW COMPARISON:  None Available. FINDINGS: Enteric tube terminates in the gastric antrum. Nonobstructive bowel gas pattern. Cholecystectomy clips. IMPRESSION: Enteric tube terminates in the gastric antrum. Electronically Signed   By: Julian Hy M.D.   On: 01/10/2022 19:06   US RENAL  Result Date: 01/10/2022 CLINICAL DATA:  Renal dysfunction EXAM: RENAL / URINARY TRACT ULTRASOUND COMPLETE COMPARISON:  01/01/2020 FINDINGS: Right Kidney: Renal measurements: 9.2 x 4.8 x 6.1 cm = volume: 141.7 mL. There is no hydronephrosis. There is increased cortical echogenicity. Left Kidney: Renal measurements: Lennie Odor by 6.2 x 4.8 cm = volume: 171.8 mL. There is no hydronephrosis. There is increased cortical echogenicity. Bladder: Foley catheter is seen in the bladder. Urinary bladder is not distended and not adequately evaluated. Other: None. IMPRESSION: There is no hydronephrosis. Increased cortical echogenicity in the  kidneys suggests medical renal disease. Electronically Signed   By: Elmer Picker M.D.   On: 01/10/2022 15:17   ECHOCARDIOGRAM COMPLETE BUBBLE STUDY  Result Date: 01/10/2022    ECHOCARDIOGRAM REPORT   Patient Name:   RAIYA STAINBACK Date of Exam: 01/10/2022 Medical Rec #:  [id number]       Height:       65.0 in Accession #:    [id number]      Weight:       209.0 lb Date of Birth:  [birth year]       BSA:          2.015 m Patient Age:    41 years        BP:           121/65 mmHg Patient Gender: F               HR:           107 bpm. Exam Location:  Inpatient Procedure: 2D Echo, Color Doppler, Cardiac Doppler and Saline Contrast Bubble            Study Indications:     Stroke  History:         Patient has prior history of Echocardiogram examinations, most                  recent 11/19/2021. CHF, CAD, Stroke; Risk Factors:Diabetes.  Sonographer:     Memory Argue Sonographer#2:   Johny Chess RDCS Referring Phys:  [id number] Lorenza Chick  Diagnosing Phys: Gwyndolyn Kaufman MD IMPRESSIONS  1. There is akinesis of the mid-to-apical septal LV segments, all apical segments, and apex. There is significant thinning of the mid-to-apical septal segments and apex likely represents scarring/infarct. The basal-to-mid inferior and basal septal segments are hypokinetic.  2. No LV thrombus visualized on definity imaging.  3. Left ventricular ejection fraction, by estimation, is 30 to 35%. The left ventricle has moderately decreased function. The left ventricle demonstrates regional wall motion abnormalities (see scoring diagram/findings for description). The left ventricular internal cavity size was mildly-to-moderately dilated. There is mild concentric left ventricular hypertrophy. Indeterminate diastolic filling due to E-A fusion.  4. Right ventricular systolic function is normal. The right ventricular size is normal.  5. The mitral valve is normal in structure. Trivial mitral valve regurgitation.  6. The aortic valve is tricuspid. Aortic valve regurgitation is not visualized. No aortic stenosis is present.  7. The inferior vena cava is dilated in size with >50% respiratory variability, suggesting right atrial pressure of 8 mmHg.  8. Agitated saline contrast bubble study was negative, with no evidence of any interatrial shunt. Comparison(s): Compared to prior TTE on 10/2021, the EF has dropped to 30-35% with similar wall motion. FINDINGS  Left Ventricle: There is akinesis of the mid-to-apical septal LV segments, all apical segments, and apex. There is significant thinning of the mid-to-apical septal segments and apex which likely represents scarring/infarct. The basal-to-mid inferior and  basal septal segments are hypokinetic. No LV thrombus visualized. Left ventricular ejection fraction, by estimation, is 30 to 35%. The left ventricle has moderately decreased function. The left ventricle demonstrates regional wall motion abnormalities. Definity contrast agent  was given IV to delineate the left ventricular endocardial borders. The left ventricular internal cavity size was mildly to moderately dilated. There is mild concentric left ventricular hypertrophy. Indeterminate diastolic filling  due to E-A fusion. Right Ventricle: The right ventricular size is normal. No increase in right ventricular wall thickness. Right ventricular  systolic function is normal. Left Atrium: Left atrial size was normal in size. Right Atrium: Right atrial size was normal in size. Pericardium: There is no evidence of pericardial effusion. Mitral Valve: The mitral valve is normal in structure. Trivial mitral valve regurgitation. Tricuspid Valve: The tricuspid valve is normal in structure. Tricuspid valve regurgitation is trivial. Aortic Valve: The aortic valve is tricuspid. Aortic valve regurgitation is not visualized. No aortic stenosis is present. Aortic valve mean gradient measures 4.0 mmHg. Aortic valve peak gradient measures 7.4 mmHg. Aortic valve area, by VTI measures 2.86 cm. Pulmonic Valve: The pulmonic valve was normal in structure. Pulmonic valve regurgitation is trivial. Aorta: The aortic root is normal in size and structure. Venous: The inferior vena cava is dilated in size with greater than 50% respiratory variability, suggesting right atrial pressure of 8 mmHg. IAS/Shunts: The atrial septum is grossly normal. Agitated saline contrast was given intravenously to evaluate for intracardiac shunting. Agitated saline contrast bubble study was negative, with no evidence of any interatrial shunt.  LEFT VENTRICLE PLAX 2D LVIDd:         4.80 cm LVIDs:         3.40 cm LV PW:         1.00 cm LV IVS:        1.10 cm LVOT diam:     2.00 cm LV SV:         57 LV SV Index:   29 LVOT Area:     3.14 cm  LV Volumes (MOD) LV vol d, MOD A2C: 170.0 ml LV vol d, MOD A4C: 112.0 ml LV vol s, MOD A2C: 108.0 ml LV vol s, MOD A4C: 61.0 ml LV SV MOD A2C:     62.0 ml LV SV MOD A4C:     112.0 ml LV SV MOD BP:       56.6 ml RIGHT VENTRICLE RV S prime:     14.10 cm/s LEFT ATRIUM             Index        RIGHT ATRIUM           Index LA diam:        4.00 cm 1.98 cm/m   RA Area:     10.40 cm LA Vol (A2C):   60.2 ml 29.87 ml/m  RA Volume:   18.80 ml  9.33 ml/m LA Vol (A4C):   43.2 ml 21.43 ml/m LA Biplane Vol: 51.0 ml 25.30 ml/m  AORTIC VALVE AV Area (Vmax):    2.82 cm AV Area (Vmean):   2.71 cm AV Area (VTI):     2.86 cm AV Vmax:           136.00 cm/s AV Vmean:          94.500 cm/s AV VTI:            0.201 m AV Peak Grad:      7.4 mmHg AV Mean Grad:      4.0 mmHg LVOT Vmax:         122.00 cm/s LVOT Vmean:        81.600 cm/s LVOT VTI:          0.183 m LVOT/AV VTI ratio: 0.91  AORTA Ao Root diam: 2.90 cm MR Peak grad: 53.3 mmHg MR Vmax:      365.00 cm/s SHUNTS  Systemic VTI:  0.18 m                           Systemic Diam: 2.00 cm Gwyndolyn Kaufman MD Electronically signed by Gwyndolyn Kaufman MD Signature Date/Time: 01/10/2022/2:00:22 PM    Final (Updated)    MR ANGIO HEAD WO CONTRAST  Result Date: 01/09/2022 CLINICAL DATA:  Status post revascularization of occluded extracranial ICA. EXAM: MRA HEAD WITHOUT CONTRAST TECHNIQUE: Angiographic images of the Circle of Willis were acquired using MRA technique without intravenous contrast. COMPARISON:  Cerebral arteriogram 01/08/2022 FINDINGS: Anterior circulation: Segmental signal is present in the high cervical segment of the ICA. No signal is visualized in the petrous are pre cavernous right ICA. Flow signal is present in the cavernous and supraclinoid right ICA as well as the posterior communicating artery. More normal flow is present in the high left cervical ICA. Signal loss in the supraclinoid left ICA may be artifactual. ICA terminus signal is within normal limits. The left A1 segment is dominant. Left M1 segment is normal. Flow is present in the right M1 segment. The MCA branch vessels are intact bilaterally vocal robust on the left. ACA branch  vessels are within normal limits bilaterally. Posterior circulation: The left vertebral artery is the dominant vessel. No flow is present in the right vertebral artery below the PICA. The basilar artery is normal. The left posterior cerebral artery originates from the basilar tip. The right posterior cerebral artery is of fetal type. The right P1 segment is present. PCA branch vessels are within normal limits bilaterally. Anatomic variants: Fetal type right posterior cerebral artery. Other: None. IMPRESSION: 1. Abnormal flow signal within the petrous and pre cavernous right ICA may represent artifact from stent or slow flow 2. Signal loss in the supraclinoid left ICA may be artifactual. 3. Asymmetric diffused decreased size of right MCA branch vessels compared to the left likely reflects decreased perfusion. No significant proximal stenosis or occlusion. Electronically Signed   By: San Morelle M.D.   On: 01/09/2022 17:17   MR BRAIN WO CONTRAST  Result Date: 01/09/2022 CLINICAL DATA:  Status post revascularization of occluded cervical ICA. EXAM: MRI HEAD WITHOUT CONTRAST TECHNIQUE: Multiplanar, multiecho pulse sequences of the brain and surrounding structures were obtained without intravenous contrast. COMPARISON:  CT head without contrast 01/08/2022 FINDINGS: Brain: Diffusion-weighted images demonstrate extensive patchy restricted diffusion throughout the MCA territory. Most confluent areas are along the watershed. There is significant sparing of cortex within the right MCA territory although scattered foci of acute infarct are noted. T2 and FLAIR hyperintensities are associated with the areas of acute infarction. Most consistent cortical infarct is in the anterior right frontal lobe along the watershed. Focal nonhemorrhagic infarct is also present within the genu of the corpus callosum and more posteriorly along the body of the corpus callosum on the right. No acute hemorrhage is present. White matter  changes extend into the brainstem. The internal auditory canals are within normal limits. Cerebellum is within normal limits. Vascular: Intravascular contrast is present in the left internal carotid artery but not the right. This likely represents T1 shortening from height noted contrast. Branch vessels are seen in the [place] territory and left MCA territory but not on the right. Flow is present in the posterior circulation. Skull and upper cervical spine: The craniocervical junction is normal. Upper cervical spine is within normal limits. Marrow signal is unremarkable. Sinuses/Orbits: Fluid levels are present the maxillary sinuses and throughout the ethmoid  air cells. Mild mucosal thickening is present in the frontal sinuses bilaterally. Fluid levels are present in the sphenoid sinuses. Small mastoid effusions are present. The globes and orbits are within normal limits. IMPRESSION: 1. Extensive patchy areas of acute nonhemorrhagic infarction involving the right MCA territory, most confluent areas along the watershed. 2. Focal nonhemorrhagic infarct involving the genu of the corpus callosum and more posteriorly along the body of the corpus callosum on the right. 3. Diffuse sinus disease is likely secondary to intubation. Electronically Signed   By: San Morelle M.D.   On: 01/09/2022 17:09   DG Abd Portable 1V  Result Date: 01/09/2022 CLINICAL DATA:  Orogastric tube placement EXAM: PORTABLE ABDOMEN - 1 VIEW COMPARISON:  CT abdomen pelvis 06/23/2014 FINDINGS: Orogastric tube terminates in the region of the gastric antrum. Mild left basilar atelectasis. No dilated loops of bowel identified within the visualized upper abdomen. Central venous catheter tip located at the cavoatrial junction. IMPRESSION: Orogastric tube terminates in the region of the gastric antrum. Electronically Signed   By: Miachel Roux M.D.   On: 01/09/2022 10:07   DG Chest Port 1 View  Result Date: 01/09/2022 CLINICAL DATA:  Endotracheal  tube placement. EXAM: PORTABLE CHEST 1 VIEW COMPARISON:  01/09/2022 earlier. FINDINGS: Endotracheal tube has tip 4.7 cm above the carina. Left IJ central venous catheter unchanged with tip at the cavoatrial junction. Lungs are hypoinflated with persistent hazy right perihilar/paramediastinal opacification and mild prominence of the central pulmonary vasculature. Stable mild opacification in the left base/retrocardiac region. No pneumothorax. Cardiomediastinal silhouette and remainder of the exam is unchanged. IMPRESSION: 1. Stable hazy opacification over the right perihilar/paramediastinal region and left base/retrocardiac region. Findings may be due to asymmetric edema versus infection. Suggestion of mild vascular congestion. 2. Tubes and lines as described. Electronically Signed   By: Marin Olp M.D.   On: 01/09/2022 09:21   DG CHEST PORT 1 VIEW  Result Date: 01/09/2022 CLINICAL DATA:  Status post intubation. EXAM: PORTABLE CHEST 1 VIEW COMPARISON:  Chest radiograph dated 01/08/2021. FINDINGS: Interval retraction of the endotracheal tube with tip now approximately 4 cm above the carina. Left IJ central venous line in similar position. Similar or slightly worsened scratch pulmonary hazy and streaky densities, likely combination of atelectasis and vascular congestion and edema. Pneumonia is not excluded. Trace left pleural effusion suspected. No pneumothorax. No acute osseous pathology. IMPRESSION: Interval retraction of the endotracheal tube with tip now approximately 4 cm above the carina. Similar or slightly worsened pulmonary opacities in the right upper lobe compared to the earlier radiograph. Electronically Signed   By: Anner Crete M.D.   On: 01/09/2022 01:04   CT HEAD WO CONTRAST (5MM)  Result Date: 01/08/2022 CLINICAL DATA:  Stroke follow-up EXAM: CT HEAD WITHOUT CONTRAST TECHNIQUE: Contiguous axial images were obtained from the base of the skull through the vertex without intravenous contrast.  RADIATION DOSE REDUCTION: This exam was performed according to the departmental dose-optimization program which includes automated exposure control, adjustment of the mA and/or kV according to patient size and/or use of iterative reconstruction technique. COMPARISON:  01/08/2022 CT head FINDINGS: Brain: Redemonstrated hypodensity in the right frontal periventricular white matter. No additional hypodensity. No evidence of acute hemorrhage, mass, mass effect, or midline shift. No hydrocephalus or extra-axial fluid collection. Vascular: No hyperdense vessel. Skull: Normal. Negative for fracture or focal lesion. Sinuses/Orbits: Mucosal thickening throughout the paranasal sinuses, with air-fluid levels with bubbly fluid in the maxillary sinuses and right sphenoid sinus. Fluid in the nasopharynx  is likely related to intubation. The orbits are unremarkable. Other: The mastoid air cells are well aerated. IMPRESSION: 1. Redemonstrated hypodensity in the right frontal periventricular white matter. No additional acute intracranial process. No additional acute infarct or hemorrhage. 2. Air-fluid levels with bubbly fluid in the maxillary sinuses and right sphenoid sinus are likely related to intubation but can be seen in the setting of sinusitis. Electronically Signed   By: Merilyn Baba M.D.   On: 01/08/2022 21:57   DG Chest Portable 1 View  Result Date: 01/08/2022 CLINICAL DATA:  Chest pain. EXAM: PORTABLE CHEST 1 VIEW COMPARISON:  Chest radiograph dated 06/18/2020. FINDINGS: Endotracheal tube with tip just above the carina. Recommend retraction by 3 cm for optimal positioning. Left IJ central venous line with tip at the cavoatrial junction. There is shallow inspiration. There is cardiomegaly with vascular congestion and edema. Pneumonia is not excluded. Clinical correlation is recommended. Probable trace left pleural effusion. No pneumothorax. No acute osseous pathology. IMPRESSION: 1. Endotracheal tube with tip just above  the carina. Recommend retraction by 3 cm for optimal positioning. 2. Cardiomegaly with vascular congestion and edema. Pneumonia is not excluded. Electronically Signed   By: Anner Crete M.D.   On: 01/08/2022 20:16   CT ANGIO HEAD NECK W WO CM (CODE STROKE)  Result Date: 01/08/2022 CLINICAL DATA:  Neuro deficit, acute, stroke suspected; [id number] EXAM: CT ANGIOGRAPHY HEAD AND NECK CT PERFUSION BRAIN TECHNIQUE: Multidetector CT imaging of the head and neck was performed using the standard protocol during bolus administration of intravenous contrast. Multiplanar CT image reconstructions and MIPs were obtained to evaluate the vascular anatomy. Carotid stenosis measurements (when applicable) are obtained utilizing NASCET criteria, using the distal internal carotid diameter as the denominator. Multiphase CT imaging of the brain was performed following IV bolus contrast injection. Subsequent parametric perfusion maps were calculated using RAPID software. RADIATION DOSE REDUCTION: This exam was performed according to the departmental dose-optimization program which includes automated exposure control, adjustment of the mA and/or kV according to patient size and/or use of iterative reconstruction technique. CONTRAST:  39m OMNIPAQUE IOHEXOL 350 MG/ML SOLN COMPARISON:  CT head from the same day. FINDINGS: CTA NECK FINDINGS Aortic arch: Great vessel origins are patent. Right carotid system: Common carotid artery is patent without significant stenosis. The ICA is occluded proximally with non opacification of the remainder of the neck. Left carotid system: Mild atherosclerosis at the carotid bifurcation without significant (greater than 50%) stenosis. Vertebral arteries: Small/non dominant right vertebral artery, which is irregular throughout its V2 segment and occluded at the V3 segment with non opacification of the proximal intradural vertebral artery and reconstitution of the distal intradural vertebral artery. Dominant  left intradural vertebral artery is patent throughout the neck without significant) greater than 50%) stenosis. Skeleton: No acute findings. Other neck: No acute findings Upper chest: Moderate layering bilateral pleural effusions. Peribronchial thickening. Review of the MIP images confirms the above findings CTA HEAD FINDINGS Anterior circulation: Non-opacified right intracranial ICA. Small but opacified right M1 MCA and proximal right M2 MCA branches. Severe left paraclinoid ICA stenosis. Left MCA and ACAs are patent. Posterior circulation: Proximal right intradural vertebral artery is not opacified. Distal intradural vertebral artery is opacified, likely from collaterals and retrograde flow. Left intradural vertebral artery, basilar artery and bilateral posterior cerebral arteries are patent without proximal hemodynamically significant stenosis. Venous sinuses: As permitted by contrast timing, patent. Review of the MIP images confirms the above findings CT Brain Perfusion Findings: ASPECTS: 10. CBF (<30%) Volume: 08m  Perfusion (Tmax>6.0s) volume: 53m Perfusion (Tmax>4.0s) volume: 38 mL Mismatch Volume: 03mInfarction Location:None identified. IMPRESSION: 1. 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 Right MCA and A1 ACA are patent, but diminutive. 2. Small/non dominant right vertebral artery, which is irregular throughout its V2 segment with age indeterminate occlusion at the V3 segment with non opacification of the proximal intradural vertebral artery and reconstitution of the distal intradural vertebral artery. 3. Severe left paraclinoid ICA stenosis. 4. One CT perfusion, no evidence of core infarct or penumbra; however, there is approximately 38 mL of T-max greater than 4 seconds in the right MCA territory which suggests possible oligemia. An MRI could provide more sensitive evaluation for acute infarct. 5. Moderate  layering bilateral pleural effusions and peribronchial wall thickening. Recommend dedicated chest imaging. Code stroke imaging results were communicated on 01/08/2022 at 2:16 pm to provider Bhagat via telephone, who verbally acknowledged these results. Electronically Signed   By: FrMargaretha Sheffield.D.   On: 01/08/2022 14:19   CT CEREBRAL PERFUSION W CONTRAST  Result Date: 01/08/2022 CLINICAL DATA:  Neuro deficit, acute, stroke suspected; [id number]: CT ANGIOGRAPHY HEAD AND NECK CT PERFUSION BRAIN TECHNIQUE: Multidetector CT imaging of the head and neck was performed using the standard protocol during bolus administration of intravenous contrast. Multiplanar CT image reconstructions and MIPs were obtained to evaluate the vascular anatomy. Carotid stenosis measurements (when applicable) are obtained utilizing NASCET criteria, using the distal internal carotid diameter as the denominator. Multiphase CT imaging of the brain was performed following IV bolus contrast injection. Subsequent parametric perfusion maps were calculated using RAPID software. RADIATION DOSE REDUCTION: This exam was performed according to the departmental dose-optimization program which includes automated exposure control, adjustment of the mA and/or kV according to patient size and/or use of iterative reconstruction technique. CONTRAST:  5050mMNIPAQUE IOHEXOL 350 MG/ML SOLN COMPARISON:  CT head from the same day. FINDINGS: CTA NECK FINDINGS Aortic arch: Great vessel origins are patent. Right carotid system: Common carotid artery is patent without significant stenosis. The ICA is occluded proximally with non opacification of the remainder of the neck. Left carotid system: Mild atherosclerosis at the carotid bifurcation without significant (greater than 50%) stenosis. Vertebral arteries: Small/non dominant right vertebral artery, which is irregular throughout its V2 segment and occluded at the V3 segment with non opacification of the proximal  intradural vertebral artery and reconstitution of the distal intradural vertebral artery. Dominant left intradural vertebral artery is patent throughout the neck without significant) greater than 50%) stenosis. Skeleton: No acute findings. Other neck: No acute findings Upper chest: Moderate layering bilateral pleural effusions. Peribronchial thickening. Review of the MIP images confirms the above findings CTA HEAD FINDINGS Anterior circulation: Non-opacified right intracranial ICA. Small but opacified right M1 MCA and proximal right M2 MCA branches. Severe left paraclinoid ICA stenosis. Left MCA and ACAs are patent. Posterior circulation: Proximal right intradural vertebral artery is not opacified. Distal intradural vertebral artery is opacified, likely from collaterals and retrograde flow. Left intradural vertebral artery, basilar artery and bilateral posterior cerebral arteries are patent without proximal hemodynamically significant stenosis. Venous sinuses: As permitted by contrast timing, patent. Review of the MIP images confirms the above findings CT Brain Perfusion Findings: ASPECTS: 10. CBF (<30%) Volume: 0mL32mrfusion (Tmax>6.0s) volume: 0mL 24mfusion (Tmax>4.0s) volume: 38 mL Mismatch Volume: 0mL I22mrction Location:None identified. IMPRESSION: 1. Age indeterminate occlusion of the proximal right ICA in the neck with non opacification distally  in the upper neck and intracranially. Right MCA and A1 ACA are patent, but diminutive. 2. Small/non dominant right vertebral artery, which is irregular throughout its V2 segment with age indeterminate occlusion at the V3 segment with non opacification of the proximal intradural vertebral artery and reconstitution of the distal intradural vertebral artery. 3. Severe left paraclinoid ICA stenosis. 4. One CT perfusion, no evidence of core infarct or penumbra; however, there is approximately 38 mL of T-max greater than 4 seconds in the right MCA territory which suggests  possible oligemia. An MRI could provide more sensitive evaluation for acute infarct. 5. Moderate layering bilateral pleural effusions and peribronchial wall thickening. Recommend dedicated chest imaging. Code stroke imaging results were communicated on 01/08/2022 at 2:16 pm to provider Bhagat via telephone, who verbally acknowledged these results. Electronically Signed   By: Margaretha Sheffield M.D.   On: 01/08/2022 14:19   CT HEAD CODE STROKE WO CONTRAST  Result Date: 01/08/2022 CLINICAL DATA:  Code stroke.  Left arm weakness.  Left facial droop. EXAM: CT HEAD WITHOUT CONTRAST TECHNIQUE: Contiguous axial images were obtained from the base of the skull through the vertex without intravenous contrast. RADIATION DOSE REDUCTION: This exam was performed according to the departmental dose-optimization program which includes automated exposure control, adjustment of the mA and/or kV according to patient size and/or use of iterative reconstruction technique. COMPARISON:  CT head without contrast 12/12/2021 FINDINGS: Brain: Periventricular white matter hypodensities adjacent to the frontal horn of the right lateral ventricles and in the right corona radiata are new since prior exam. No acute or focal cortical abnormality is present. Basal ganglia are intact. Insular ribbon is normal. The ventricles are of normal size. No significant extraaxial fluid collection is present. The brainstem and cerebellum are within normal limits. Vascular: Atherosclerotic calcifications are again seen within the cavernous internal carotid arteries. No hyperdense vessel is present. No significant interval change is present. Skull: Calvarium is intact. No focal lytic or blastic lesions are present. No significant extracranial soft tissue lesion is present. Sinuses/Orbits: Mild mucosal thickening is present maxillary sinuses bilaterally. The paranasal sinuses and mastoid air cells are otherwise clear. The globes and orbits are within normal limits.  ASPECTS [facility] Stroke Program Early CT Score) - Ganglionic level infarction (caudate, lentiform nuclei, internal capsule, insula, M1-M3 cortex): 7/7 - Supraganglionic infarction (M4-M6 cortex): 3/3 Total score (0-10 with 10 being normal): 10/10 IMPRESSION: 1. Periventricular white matter hypodensities adjacent to the frontal horn of the right lateral ventricles and in the right corona radiata are new since prior exam. This may represent acute ischemia. 2. No acute or focal cortical abnormality. 3. ASPECTS is 10/10. The above was relayed via text pager to Dr. Curly Shores on 01/08/2022 at 13:39 . Electronically Signed   By: San Morelle M.D.   On: 01/08/2022 13:41    Labs:  CBC: Recent Labs    01/09/22 0503 01/10/22 0522 01/10/22 1509 01/11/22 0532  WBC 26.5* 19.4* 21.5* 17.3*  HGB 9.0* 7.3* 8.4* 8.2*  HCT 25.8* 21.6* 25.0* 24.2*  PLT 327 315 293 317    COAGS: Recent Labs    08/28/21 0001 12/12/21 0000 01/08/22 1326  INR 1.1 1.0 1.5*  APTT 32 30 36    BMP: Recent Labs    01/09/22 2331 01/10/22 0522 01/10/22 1757 01/11/22 0532  NA 139 139 141 141  K 4.3 4.0 3.9 3.5  CL 111 107 108 109  CO2 18* 18* 19* 20*  GLUCOSE 246* 141* 175* 82  BUN 38* 41*  42* 42*  CALCIUM 7.0* 7.2* 7.6* 8.1*  CREATININE 3.80* 4.17* 4.54* 4.16*  GFRNONAA 15* 13* 12* 13*    LIVER FUNCTION TESTS: Recent Labs    11/18/21 0555 11/30/21 1016 12/12/21 0000 01/08/22 1326 01/08/22 2014  BILITOT 1.0  --  0.3 0.4 0.5  AST 14*  --  16 18 34  ALT 15  --  19 14 19   ALKPHOS 105  --  80 73 67  PROT 7.3  --  5.6* 5.6* 4.4*  ALBUMIN 3.1* 3.2* 2.7* 2.6* 2.1*    Assessment and Plan:  41 y/o F admitted 01/08/22 with 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 Estanislado Pandy).   Patient extubated recently, per chart was following commands prior to extubation however no longer doing so consistently. On exam able to make needs known by  squeezing hand to yes/no questions, unable to verbalize any words, does not reliably make eye contact/track examiner when asked. Moving right side, no movement on the left.   CCM to manage acute respiratory issues -- likely due to recently (+) rhinovirus/suspected COPD exacerbation/bronchospasm.  Neurology following, aware of reported change in ability to follow commands after extubation.   NIR will continue to follow along, recommend continuing ASA and Brilinta due to risk of in-stent thrombosis.   Please call with questions or concerns.   Electronically Signed: Joaquim Nam, PA-C 01/11/2022, 9:24 AM   I spent a total of 15 Minutes at the the patient's bedside AND on the patient's hospital floor or unit, greater than 50% of which was counseling/coordinating care for right ICA stent placement follow up.

## 2022-01-11 NOTE — Progress Notes (Signed)
NAME:  Lauren [name], MRN:  [id number], DOB:  December 27, 1980, LOS: 3 ADMISSION DATE:  01/08/2022, CONSULTATION DATE:  01/08/2022 REFERRING MD:  Dr Curly Shores, CHIEF COMPLAINT:  Stroke   History of Present Illness:  41 y/o F with known CAD, HFrEF, DM type II c/b neuropathy, stage III CKD, peripheral vascular disease admitted with R MCA stroke and vertebral artery stroke.   Patient presented to the ED 9/8 with left sided facial droop, left sided weakness, and slurred speech.  LKW: unknown.  Code stroke initiated. NIHSS: 8.  Imaging revealed: age indeterminate occlusion of proximal right ICA, V3 segment of right vertebral artery, left paraclinoid ICA stenosis. No core infarct or penumbra. Patient went immediately to IR for thrombectomy.  Of note, patient with recent arterial thrombectomy of the right upper extremity due to limb ischemia.  She has had URI x 2 days with recent sick contact.   COVID negative but Rhinovirus positive. Extubated in ICU post procedure.    Pertinent  Medical History  Anemia  CAD s/p MI CHF - LVEF 55-60%, down to 35-40% in 05/2020 CKD - Stage IV DM  Neuropathy  LLE Toe Amputation R ICA CVA   Significant Hospital Events: Including procedures, antibiotic start and stop dates in addition to other pertinent events   9/8 Admitted with left sided weakness, facial droop.  NIHSS 8, to IR  for thrombectomy, intubated.  MRSA PCR +, Rhinovirus + 9/10 PSV wean, moving RUE/RLE   Interim History / Subjective:  Afebrile  I/O 2.4L UOP, -1.4L in last 24 hours  RN reports tachypnea, no fever  Tracheal aspirate, blood cultures pending  Pt indicates chest discomfort and squeezes yes to SOB  Objective   Blood pressure (!) 143/100, pulse (!) 106, temperature 97.7 F (36.5 C), temperature source Axillary, resp. rate (!) 28, height 5\' 5"  (1.651 m), weight 95.4 kg, last menstrual period 11/01/2021, SpO2 94 %.        Intake/Output Summary (Last 24 hours) at 01/11/2022 0834 Last data filed at  01/11/2022 0500 Gross per 24 hour  Intake 1056.12 ml  Output 2485 ml  Net -1428.88 ml   Filed Weights   01/09/22 0500 01/10/22 0500 01/11/22 0500  Weight: 94.9 kg 94.8 kg 95.4 kg    Examination: General:  adult female sitting up in bed, husband at bedside  HEENT: MM pink/moist, [place] O2, pupils =/reactive, anicteric  Neuro: Awake, spontaneous movement on right, follows commands on right - able to squeeze and to yes/no questions, points to chest and indicates SOB CV: s1s2 RRR, tachy, no m/r/g PULM: tachypnea with mild abd accessory muscle use, significant wheezing bilaterally, strong cough  GI: soft, bsx4 active, NGT in place with bilious drainage  Extremities: warm/dry, no edema, L foot with kerlex dressing Skin: no rashes or lesions   Resolved Hospital Problem list     Assessment & Plan:   Right MCA and right vertebral stroke s/p thrombectomy, right ICA dissection repair, stent placement Hx R Hand Ischemia s/p Thrombectomy 10/2021 Dysphagia  Not a candidate for thrombolytics, was on Eliquis PTA. Post procedure CT Head negative for ICH. S/p cangrelor.  -goal SBP 120-140  -continue ASA + Brilinta -follow serial neuro exam  -appreciate Neurology / Neuro IR  -assess ECHO -SLP efforts > NPO currently  -PT / OT  Acute Hypoxic Respiratory Failure  Rhinovirus Positive  Acute Exacerbation of Suspected COPD / Bronchospasm  Tobacco Abuse  No documented hx of underlying lung disease but at minimum 1ppd smoker. Positive for rhinovirus.  -  pulmonary hygiene as able - upright positioning  -aspiration precautions  -at risk reintubation with increased respiratory effort / bronchospasm  -add solumedrol  -add brovana, pulmicort, yupelri nebulization  -add nicotine patch   AKI on CKD IV Acute Metabolic Acidosis 2/2 DKA  Contrast Induced Nephropathy  Admit sr cr 2.0, DKA on admit resolved. UA unremarkable. AKI suspected to be related to CIN. Diuresed 9/10 with good response.  -appreciate  Nephrology  -Trend BMP / urinary output -Replace electrolytes as indicated -Avoid nephrotoxic agents, ensure adequate renal perfusion -defer further diuresis  to Nephro  Normocytic Anemia Multifactorial in setting of blood loss, chronic disease. PRBC 9/10 -trend CBC  -transfuse for Hgb <7% or active bleeding   Chronic Systolic & Diastolic CHF  CAD  ECHO 01/4708 with LVEF 40-45%, G2DD -supportive care  -tele monitoring   Chest Discomfort  Initial elevation of troponin with peak.  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 Consider removal pm of 9/11 Foley:  Yes, and it is still needed Code Status:  full code Last date of multidisciplinary goals of care discussion: 9/11    Critical Care Time: 27 minutes    Noe Gens, MSN, APRN, NP-C, AGACNP-BC [facility] 01/11/2022, 8:35 AM   Please see [email or web address] for pager details.   From 7A-7P if no response, please call [phone] After hours, please call ELink [phone]

## 2022-01-11 NOTE — Progress Notes (Addendum)
Inpatient Diabetes Program Recommendations  AACE/ADA: New Consensus Statement on Inpatient Glycemic Control (2015)  Target Ranges:  Prepandial:   less than 140 mg/dL      Peak postprandial:   less than 180 mg/dL (1-2 hours)      Critically ill patients:  140 - 180 mg/dL   Lab Results  Component Value Date   GLUCAP 149 (H) 01/11/2022   HGBA1C 8.0 (H) 01/09/2022    Review of Glycemic Control  Latest Reference Range & Units 01/11/22 03:39 01/11/22 07:53 01/11/22 11:50  Glucose-Capillary 70 - 99 mg/dL 88 105 (H) 149 (H)   Diabetes history: DM 1 Outpatient Diabetes medications:  Omnipod insulin pump (unsure of settings) (Prior to omnipod patient was taking Semglee 34 units daily and Novolog 4-10 units with meals) Current orders for Inpatient glycemic control:  Novolog 2 units q 4 hours (added today) Novolog 0-15 units q 4 hours Semglee 30 units daily Solumedrol 40 mg daily (for 5 doses) Vital at 40 ml/hr  Inpatient Diabetes Program Recommendations:    Note that patient has history of Type 1 DM.  It appears per chart review that patient was using Omnipod insulin pump prior to hospitalization.  Unclear when insulin pump was stopped.  Patient required insulin drip on 01/09/22 in the evening after procedure.  Per RN patient is still wearing sensor? Husband not at bedside at this time.  Will follow-up on 9/12.  Tube feeds started today.  Agree with the addition of Novolog tube feed coverage.    Thanks,  Adah Perl, RN, BC-ADM Inpatient Diabetes Coordinator Pager [phone]  (8a-5p)

## 2022-01-11 NOTE — Progress Notes (Signed)
[place] for heparin Indication: chest pain/ACS  Allergies  Allergen Reactions   Wellbutrin [Bupropion] Hives   Cefepime Rash    Tolerates penicilllin   Ciprofloxacin Hcl Hives and Rash    Hives/rash at injection site    Tape Rash    Patient Measurements: Height: 5\' 5"  (165.1 cm) Weight: 95.4 kg (210 lb 5.1 oz) IBW/kg (Calculated) : 57 Heparin Dosing Weight: 77kg  Vital Signs: Temp: 98.1 F (36.7 C) (09/11 2000) Temp Source: Axillary (09/11 2000) BP: 178/103 (09/11 2330) Pulse Rate: 108 (09/11 2330)  Labs: Recent Labs    01/10/22 0522 01/10/22 1509 01/10/22 1757 01/11/22 0532 01/11/22 1001  HGB 7.3* 8.4*  --  8.2*  --   HCT 21.6* 25.0*  --  24.2*  --   PLT 315 293  --  317  --   CREATININE 4.17*  --  4.54* 4.16*  --   TROPONINIHS  --   --   --   --  3,869*    Estimated Creatinine Clearance: 20.8 mL/min (A) (by C-G formula based on SCr of 4.16 mg/dL (H)).   Medical History: Past Medical History:  Diagnosis Date   Anemia    CAD (coronary artery disease)    a. s/p cath in 03/2014 showing 30% mid-LAD, moderate to severe disease along small D1, patent LCx, moderate to severe distal OM2 stenosis and moderate diffuse diease along RCA not amenable to PCI   CHF (congestive heart failure) ([place])    a. EF 55-60% in 12/2019 b. EF at 35-40% by echo in 05/2020   CKD (chronic kidney disease) stage 4, GFR 15-29 ml/min (HCC)    Diabetes mellitus without complication ([place])    Myocardial infarction ([place])    Neuropathy    Stroke [facility])       Assessment: 47 yoF admitted with R MCA vertebral stroke s/p thrombectomy and stent placement. Pt is on apixaban prior to admit, no anticoagulation received this admission. Pt with elevated troponins and now chest pain, pharmacy asked to dose IV heparin for ACS management. Will defer bolus and utilize lower goal in setting of recent CVA. Will check aPTT along with initial heparin level but can likely  dose via heparin levels since last dose of apixaban was 9/7.  Goal of Therapy:  Heparin level 0.3-0.5 units/ml aPTT 66-85 seconds Monitor platelets by anticoagulation protocol: Yes   Plan:  Heparin 900 units/h no bolus Check aPTT and heparin level in 8h  Arrie Senate, PharmD, [place], [facility] Clinical Pharmacist Please check AMION for all [facility] Pharmacy numbers 01/11/2022

## 2022-01-11 NOTE — Significant Event (Signed)
Notified about pt w CP. Pt was immediately evaluated.  Patient is hemodynamically stable.  EKG reviewed no evidence of STEMI. Patient was previously evaluated and recommended to have medical treatment.  Continue with DAPT. Patient continues to have chest pain.  Pain is not reproducible with palpation. Noted to be hypertensive, on BiPAP.  Diuresing well.  Telemetry reviewed no significant arrhythmia noted. Serial EKG noted. Plan. Trend troponin until starts downtrending. Continue with DAPT. Start patient on heparin drip for 48 hours. Fentanyl 25 mcg IV once for chest pain. Start patient on nitroglycerin drip given chest pain and hypertension as well as pulmonary edema

## 2022-01-11 NOTE — Consult Note (Addendum)
Cardiology Consultation   Patient ID: Lanitra Battaglini MRN: [id number]; DOB: [birth year]  Admit date: 01/08/2022 Date of Consult: 01/11/2022  PCP:  Lemmie Evens, [place] Providers Cardiologist:  Carlyle Dolly, MD        Patient Profile:   Seynabou Fults is a 41 y.o. female with a hx of  MI w/ cath 2015 med rx for mod-severe dz D1,OM2, RCA (u/a to do PCI), DM1, HTN, chronic systolic CHF 31/4970 LVEF 35-40%, grade III dd, apex akinetic, hypothyroidism, CKD stage IIIa, HLD, class II obesity, hx CVA 2020, tob use (quit at time of CVA) who is being seen 01/11/2022 for the evaluation of EKG changes at the request of Dr. Vaughan Browner.  History of Present Illness:   Ms. Frumkin with hx as above with cardiac cath 2015 with a. s/p cath in 03/2014 showing 30% mid-LAD, moderate to severe disease along small D1, patent LCx, moderate to severe distal OM2 stenosis and moderate diffuse diease along RCA not amenable to PCI.  Last echo 10/2021 with EF 40-45%, LV mildly decreased function. +RWMA, moderate LVH, G2DD.  RV was normal LA mildly dilated.   Last nuc study 2021 with scar and only mild ischemia.  Treated medically.  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   In July 2023 she had Rt arm thrombectomy for numbness and pain in Rt upper ext -acute ischemia of Rt upper ext. acute thrombus in the distal brachial artery as well as significant amounts of acute thrombus retrieved from both radial and ulnar artery.  Placed on Eliquis.   She has not missed doses per family.    On 8/28 podiatry evaluation MRI was recommended to evaluate for contiguous residual osteomyelitis with plan for potential angiography as well due to poor healing, and per 9/5 telephone note fourth metatarsal head resection and proximal phalangeal resection will likely be necessary and is planned after possible revascularization. Echo without clot and no atrial fib.   Pt  presented 01/08/22 with Code Stroke -unsteady gait, Lt sided facial droop, lt arm weakness with drift. Slurred speech.   She underwent left vertebral arteriogram and right common carotid arteriogram.  Right MCA and right vertebral stroke s/p thrombectomy, R ICA dissection repair, and stent placement Pt given 81 mg ASA and 180 of Brilinta per OG. Also with cangrelor IV half bolus and infusion.   Kept intubated until 01/10/22.  She developed AKI with Cr up to 4.1.   Nephrology is consulting  She had URI prior to admit and neg COVID + Rhinovirus.   She had lasix today for bilateral pul edema on CXR yesterday and today and +4.4 L  Resp have been elevated as well.    Pt is not speaking intermittently following commands. Lt arm flaccid. Did complain of some chest discomfort today and SOB.  Na 141 K+ 3.5 BUN 42, Cr 4.16 Mg+ 1.9  HS troponin 01/08/22 initial 1340, 860 and today 3,869  Mg+1.9   EKG:  The EKGs from 01/09/22 was personally reviewed and demonstrates:  SR with Q waves ant leads that are old , no acute ST changes  EKG today SR with possible mild ST elevation ant leads and noon specific ST wave abnormalities in inf lat leads.    Telemetry:  Telemetry was personally reviewed and demonstrates:  SR to ST and occ PVCs  BP 155/87 P 100 R 22 temp ax 99   Past Medical History:  Diagnosis Date  Anemia    CAD (coronary artery disease)    a. s/p cath in 03/2014 showing 30% mid-LAD, moderate to severe disease along small D1, patent LCx, moderate to severe distal OM2 stenosis and moderate diffuse diease along RCA not amenable to PCI   CHF (congestive heart failure) ([place])    a. EF 55-60% in 12/2019 b. EF at 35-40% by echo in 05/2020   CKD (chronic kidney disease) stage 4, GFR 15-29 ml/min (HCC)    Diabetes mellitus without complication ([place])    Myocardial infarction ([place])    Neuropathy    Stroke [facility])     Past Surgical History:  Procedure Laterality Date   AMPUTATION Left 09/02/2021   Procedure:  AMPUTATION RAY;  Surgeon: Criselda Peaches, DPM;  Location: [place];  Service: Podiatry;  Laterality: Left;  sagittal saw, 3L bag saline & Pulse   Cardiac catherization     CHOLECYSTECTOMY     I & D EXTREMITY Left 08/30/2021   Procedure: IRRIGATION AND DEBRIDEMENT WITH BONE BIOPSY;  Surgeon: [name], DPM;  Location: [place];  Service: Podiatry;  Laterality: Left;   IRRIGATION AND DEBRIDEMENT FOOT Left 09/04/2021   Procedure: IRRIGATION AND DEBRIDEMENT FOOT AND CLOSURE;  Surgeon: Criselda Peaches, DPM;  Location: [place];  Service: Podiatry;  Laterality: Left;   RADIOLOGY WITH ANESTHESIA N/A 01/08/2022   Procedure: IR WITH ANESTHESIA;  Surgeon: Radiologist, Medication, MD;  Location: [place];  Service: Radiology;  Laterality: N/A;   THROMBECTOMY BRACHIAL ARTERY Right 11/18/2021   Procedure: RIGHT BRACHIAL, RADIAL, & ULNAR ARTERY THROMBECTOMY.;  Surgeon: Marty Heck, MD;  Location: [place];  Service: Vascular;  Laterality: Right;   TUBAL LIGATION       Home Medications:  Prior to Admission medications   Medication Sig Start Date End Date Taking? Authorizing Provider  apixaban (ELIQUIS) 5 MG TABS tablet Take 1 tablet by mouth twice daily 12/17/21  Yes Marty Heck, MD  amoxicillin-clavulanate (AUGMENTIN) 875-125 MG tablet Take 1 tablet by mouth 2 (two) times daily for 14 days. 12/28/21 01/11/22  McDonald, Stephan Minister, DPM  Apixaban Starter Pack, 63m and 561m (ELIQUIS DVT/PE STARTER PACK) Take as directed on package: start with two-102m62mablets twice daily for 7 days. On day 8, switch to one-102mg26mblet twice daily. (Take two tabs (10mg83mice daily through 7/27, then on 7/28 reduce to 1 tab (102mg) 76mce daily.) Patient not taking: Reported on 12/22/2021 12/17/21   Clark,Marty Heckatorvastatin (LIPITOR) 40 MG tablet TAKE 1 TABLET BY MOUTH ONCE DAILY. Patient taking differently: Take 40 mg by mouth daily. 09/24/21   Barrett, RhondaEvelene Croon  clopidogrel (PLAVIX) 75 MG tablet Take 1 tablet (75  mg total) by mouth daily. 09/24/21   Barrett, RhondaEvelene Croon  Continuous Blood Gluc Sensor (DEXCOM G6 SENSOR) MISC Change sensor every 10 days as directed 09/29/21   ReardoBrita RompContinuous Blood Gluc Transmit (DEXCOM G6 TRANSMITTER) MISC Change transmitter every 90 days as directed. 09/29/21   ReardoBrita Rompfenofibrate 160 MG tablet TAKE 1 TABLET BY MOUTH ONCE DAILY. Patient taking differently: Take 160 mg by mouth daily. 10/01/20   BranchArnoldo Lenisfurosemide (LASIX) 40 MG tablet Take 1 tablet (40 mg total) by mouth daily. Take daily or as directed Patient taking differently: Take 40 mg by mouth daily as needed for fluid or edema. 09/24/21   Barrett, RhondaEvelene Croon  hydrALAZINE (APRESOLINE) 25 MG tablet Take  1 & 1/2 tablets (37.5 mg total) by mouth 3 (three) times daily. Patient taking differently: Take 25 mg by mouth 3 (three) times daily. 02/19/21   Arnoldo Lenis, MD  Insulin Disposable Pump (OMNIPOD 5 G6 INTRO, GEN 5,) KIT Change pod every 48-72 hrs 10/19/21   Brita Romp, NP  Insulin Disposable Pump (OMNIPOD 5 G6 POD, GEN 5,) MISC Change pod every 48-72 hours 10/19/21   Brita Romp, NP  isosorbide mononitrate (IMDUR) 30 MG 24 hr tablet Take 1 tablet (30 mg total) by mouth daily. 09/24/21 09/19/22  Barrett, Evelene Croon, PA-C  levothyroxine (SYNTHROID) 175 MCG tablet Take 175 mcg by mouth daily before breakfast.    [provider]  metoprolol succinate (TOPROL-XL) 25 MG 24 hr tablet Take 1 tablet (25 mg total) by mouth daily. 09/24/21   Barrett, Evelene Croon, PA-C  nitroGLYCERIN (NITROSTAT) 0.4 MG SL tablet Place 1 tablet (0.4 mg total) under the tongue every 5 (five) minutes x 3 doses as needed for chest pain. 09/24/21   Barrett, Evelene Croon, PA-C  oxyCODONE (OXY IR/ROXICODONE) 5 MG immediate release tablet Take 1 tablet (5 mg total) by mouth every 6 (six) hours as needed for moderate pain. 11/21/21   Oswald Hillock, MD  pantoprazole (PROTONIX) 40 MG tablet Take 1  tablet (40 mg total) by mouth daily as needed for acid reflux Patient taking differently: Take 40 mg by mouth daily as needed (heartburn). 11/26/20     potassium chloride SA (KLOR-CON M) 20 MEQ tablet Take 1 tablet (20 mEq total) by mouth daily 09/24/21   Barrett, Evelene Croon, PA-C  predniSONE (DELTASONE) 5 MG tablet 6 day taper; Take as directed on written instructions provided during visit Patient not taking: Reported on 12/22/2021 12/11/21   Mar Daring, PA-C    Inpatient Medications: Scheduled Meds:  amoxicillin-clavulanate  1 tablet Per Tube Q12H   arformoterol  15 mcg Nebulization BID   aspirin  81 mg Oral Daily   Or   aspirin  81 mg Per Tube Daily   atorvastatin  40 mg Per Tube Daily   budesonide (PULMICORT) nebulizer solution  0.5 mg Nebulization BID   Chlorhexidine Gluconate Cloth  6 each Topical Daily   docusate  100 mg Per Tube BID   feeding supplement (PROSource TF20)  60 mL Per Tube Daily   feeding supplement (VITAL HIGH PROTEIN)  1,000 mL Per Tube Q24H   insulin aspart  0-15 Units Subcutaneous Q4H   insulin aspart  2 Units Subcutaneous Q4H   insulin glargine-yfgn  30 Units Subcutaneous Q24H   levothyroxine  75 mcg Per Tube Q0600   [START ON 01/12/2022] methylPREDNISolone (SOLU-MEDROL) injection  40 mg Intravenous Daily   mupirocin ointment  1 Application Nasal BID   nicotine  21 mg Transdermal Daily   pantoprazole (PROTONIX) IV  40 mg Intravenous QHS   polyethylene glycol  17 g Per Tube Daily   revefenacin  175 mcg Nebulization Daily   ticagrelor  90 mg Oral BID   Or   ticagrelor  90 mg Per Tube BID   Continuous Infusions:  PRN Meds: acetaminophen **OR** acetaminophen (TYLENOL) oral liquid 160 mg/5 mL **OR** acetaminophen, docusate sodium, labetalol, mouth rinse, polyethylene glycol, senna-docusate  Allergies:    Allergies  Allergen Reactions   Wellbutrin [Bupropion] Hives   Cefepime Rash    Tolerates penicilllin   Ciprofloxacin Hcl Hives and Rash     Hives/rash at injection site    Tape Rash  Social History:   Social History   Socioeconomic History   Marital status: Married    Spouse name: [name]   Number of children: Not on file   Years of education: Not on file   Highest education level: Not on file  Occupational History    Comment: Full time   Occupation: CNA at [employer] Use   Smoking status: Former    Packs/day: 0.25    Types: Cigarettes    Quit date: 10/27/2020    Years since quitting: 1.2   Smokeless tobacco: Never  Vaping Use   Vaping Use: Never used  Substance and Sexual Activity   Alcohol use: No    Alcohol/week: 0.0 standard drinks of alcohol   Drug use: No   Sexual activity: Yes    Birth control/protection: Surgical    Comment: tubal   Other Topics Concern   Not on file  Social History Narrative   Lives with husband and kids   Right handed   Drinks 9+ cups caffeine daily   Social Determinants of Health   Financial Resource Strain: High Risk (12/22/2021)   Overall Financial Resource Strain (CARDIA)    Difficulty of Paying Living Expenses: Hard  Food Insecurity: Food Insecurity Present (12/22/2021)   Hunger Vital Sign    Worried About Running Out of Food in the Last Year: Sometimes true    Ran Out of Food in the Last Year: Never true  Transportation Needs: No Transportation Needs (12/22/2021)   PRAPARE - Hydrologist (Medical): No    Lack of Transportation (Non-Medical): No  Physical Activity: Insufficiently Active (07/23/2020)   Exercise Vital Sign    Days of Exercise per Week: 1 day    Minutes of Exercise per Session: 30 min  Stress: Stress Concern Present (12/22/2021)   [place]    Feeling of Stress : To some extent  Social Connections: Moderately Integrated (12/15/2021)   Social Connection and Isolation Panel [NHANES]    Frequency of Communication with Friends and Family: More than three times a  week    Frequency of Social Gatherings with Friends and Family: More than three times a week    Attends Religious Services: Never    Marine scientist or Organizations: Yes    Attends Archivist Meetings: 1 to 4 times per year    Marital Status: Married  Human resources officer Violence: Not At Risk (12/09/2021)   Humiliation, Afraid, Rape, and Kick questionnaire    Fear of Current or Ex-Partner: No    Emotionally Abused: No    Physically Abused: No    Sexually Abused: No    Family History:    Family History  Problem Relation Age of Onset   Thyroid disease Mother    Hypertension Mother    Hyperlipidemia Mother    CAD Mother    CVA Mother    Hyperlipidemia Father    Hypertension Father    CAD Father    Breast cancer Paternal Grandmother 23   Cancer Paternal Grandmother        lung     ROS:  Please see the history of present illness.  General:+ colds or fevers, prior to admit no weight changes Skin:no rashes or ulcers HEENT:no blurred vision, no congestion CV:see HPI PUL:see HPI GI:no diarrhea constipation or melena, no indigestion GU:no hematuria, no dysuria MS:no joint pain, no claudication Neuro:no syncope, no lightheadedness Endo:+ diabetes type I with  DKA here, no thyroid disease  All other ROS reviewed and negative.     Physical Exam/Data:   Vitals:   01/11/22 1000 01/11/22 1100 01/11/22 1200 01/11/22 1251  BP: (!) 152/87 (!) 158/98 (!) 165/89   Pulse: (!) 111 (!) 102 (!) 107   Resp: (!) 31 (!) 25 (!) 30   Temp:   99 F (37.2 C)   TempSrc:   Axillary   SpO2: 93% 94% 93% 97%  Weight:      Height:        Intake/Output Summary (Last 24 hours) at 01/11/2022 1310 Last data filed at 01/11/2022 1220 Gross per 24 hour  Intake 398.75 ml  Output 3175 ml  Net -2776.25 ml      01/11/2022    5:00 AM 01/10/2022    5:00 AM 01/09/2022    5:00 AM  Last 3 Weights  Weight (lbs) 210 lb 5.1 oz 208 lb 15.9 oz 209 lb 3.5 oz  Weight (kg) 95.4 kg 94.8 kg 94.9 kg      Body mass index is 35 kg/m.  Exam per Dr. Harriet Masson General:  Well nourished, well developed, in no acute distress on bipap HEENT: normal Neck: no JVD Vascular: No carotid bruits; Distal pulses 2+ bilaterally Cardiac:  normal S1, S2; RRR; no murmur  Lungs:  clear to auscultation bilaterally, no wheezing, rhonchi or rales  Abd: soft, nontender, no hepatomegaly  Ext: no edema Musculoskeletal:  No deformities, BUE and BLE strength normal and equal Skin: warm and dry  Neuro:  CNs 2-12 intact, no focal abnormalities noted Psych:  Normal affect   Relevant CV Studies:  Echo 01/10/22 IMPRESSIONS   1. There is akinesis of the mid-to-apical septal LV segments, all apical  segments, and apex. There is significant thinning of the mid-to-apical  septal segments and apex likely represents scarring/infarct. The  basal-to-mid inferior and basal septal  segments are hypokinetic.   2. No LV thrombus visualized on definity imaging.   3. Left ventricular ejection fraction, by estimation, is 30 to 35%. The  left ventricle has moderately decreased function. The left ventricle  demonstrates regional wall motion abnormalities (see scoring  diagram/findings for description). The left  ventricular internal cavity size was mildly-to-moderately dilated. There  is mild concentric left ventricular hypertrophy. Indeterminate diastolic  filling due to E-A fusion.   4. Right ventricular systolic function is normal. The right ventricular  size is normal.   5. The mitral valve is normal in structure. Trivial mitral valve  regurgitation.   6. The aortic valve is tricuspid. Aortic valve regurgitation is not  visualized. No aortic stenosis is present.   7. The inferior vena cava is dilated in size with >50% respiratory  variability, suggesting right atrial pressure of 8 mmHg.   8. Agitated saline contrast bubble study was negative, with no evidence  of any interatrial shunt.   Comparison(s): Compared to prior  TTE on 10/2021, the EF has dropped to  30-35% with similar wall motion.   FINDINGS   Left Ventricle: There is akinesis of the mid-to-apical septal LV  segments, all apical segments, and apex. There is significant thinning of  the mid-to-apical septal segments and apex which likely represents  scarring/infarct. The basal-to-mid inferior and   basal septal segments are hypokinetic. No LV thrombus visualized. Left  ventricular ejection fraction, by estimation, is 30 to 35%. The left  ventricle has moderately decreased function. The left ventricle  demonstrates regional wall motion abnormalities.  Definity contrast agent was  given IV to delineate the left ventricular  endocardial borders. The left ventricular internal cavity size was mildly  to moderately dilated. There is mild concentric left ventricular  hypertrophy. Indeterminate diastolic filling   due to E-A fusion.   Right Ventricle: The right ventricular size is normal. No increase in  right ventricular wall thickness. Right ventricular systolic function is  normal.   Left Atrium: Left atrial size was normal in size.   Right Atrium: Right atrial size was normal in size.   Pericardium: There is no evidence of pericardial effusion.   Mitral Valve: The mitral valve is normal in structure. Trivial mitral  valve regurgitation.   Tricuspid Valve: The tricuspid valve is normal in structure. Tricuspid  valve regurgitation is trivial.   Aortic Valve: The aortic valve is tricuspid. Aortic valve regurgitation is  not visualized. No aortic stenosis is present. Aortic valve mean gradient  measures 4.0 mmHg. Aortic valve peak gradient measures 7.4 mmHg. Aortic  valve area, by VTI measures 2.86  cm.   Pulmonic Valve: The pulmonic valve was normal in structure. Pulmonic valve  regurgitation is trivial.   Aorta: The aortic root is normal in size and structure.   Venous: The inferior vena cava is dilated in size with greater than 50%   respiratory variability, suggesting right atrial pressure of 8 mmHg.   IAS/Shunts: The atrial septum is grossly normal. Agitated saline contrast  was given intravenously to evaluate for intracardiac shunting. Agitated  saline contrast bubble study was negative, with no evidence of any  interatrial shunt.   Echo 11/19/21 IMPRESSIONS     1. Left ventricular ejection fraction, by estimation, is 40 to 45%. The  left ventricle has mildly decreased function. The left ventricle  demonstrates regional wall motion abnormalities (see scoring  diagram/findings for description). There is moderate  concentric left ventricular hypertrophy. Left ventricular diastolic  parameters are consistent with Grade II diastolic dysfunction  (pseudonormalization). Elevated left ventricular end-diastolic pressure.   2. Right ventricular systolic function is normal. The right ventricular  size is normal. There is normal pulmonary artery systolic pressure.   3. Left atrial size was mildly dilated.   4. The mitral valve is normal in structure. Trivial mitral valve  regurgitation. No evidence of mitral stenosis.   5. The aortic valve is tricuspid. Aortic valve regurgitation is not  visualized. No aortic stenosis is present.   6. The inferior vena cava is normal in size with greater than 50%  respiratory variability, suggesting right atrial pressure of 3 mmHg.   FINDINGS   Left Ventricle: Left ventricular ejection fraction, by estimation, is 40  to 45%. The left ventricle has mildly decreased function. The left  ventricle demonstrates regional wall motion abnormalities. Definity  contrast agent was given IV to delineate the  left ventricular endocardial borders. The left ventricular internal cavity  size was normal in size. There is moderate concentric left ventricular  hypertrophy. Left ventricular diastolic parameters are consistent with  Grade II diastolic dysfunction  (pseudonormalization). Elevated left  ventricular end-diastolic pressure.      LV Wall Scoring:  The apical septal segment and apex are akinetic. The entire inferior wall,  mid anteroseptal segment, mid inferolateral segment, mid inferoseptal  segment, apical anterior segment, and basal inferoseptal segment are  hypokinetic. The anterior wall, antero-lateral wall, basal anteroseptal  segment, basal inferolateral segment, and apical lateral segment are  normal.   Right Ventricle: The right ventricular size is normal. No increase in  right ventricular wall  thickness. Right ventricular systolic function is  normal. There is normal pulmonary artery systolic pressure. The tricuspid  regurgitant velocity is 1.30 m/s, and   with an assumed right atrial pressure of 3 mmHg, the estimated right  ventricular systolic pressure is 9.8 mmHg.   Left Atrium: Left atrial size was mildly dilated.   Right Atrium: Right atrial size was normal in size.   Pericardium: Trivial pericardial effusion is present. The pericardial  effusion is circumferential.   Mitral Valve: The mitral valve is normal in structure. Trivial mitral  valve regurgitation. No evidence of mitral valve stenosis.   Tricuspid Valve: The tricuspid valve is normal in structure. Tricuspid  valve regurgitation is trivial. No evidence of tricuspid stenosis.   Aortic Valve: The aortic valve is tricuspid. Aortic valve regurgitation is  not visualized. No aortic stenosis is present. Aortic valve mean gradient  measures 4.0 mmHg. Aortic valve peak gradient measures 7.5 mmHg. Aortic  valve area, by VTI measures 2.94  cm.   Pulmonic Valve: The pulmonic valve was normal in structure. Pulmonic valve  regurgitation is trivial. No evidence of pulmonic stenosis.   Aorta: The aortic root is normal in size and structure.   Venous: The inferior vena cava is normal in size with greater than 50%  respiratory variability, suggesting right atrial pressure of 3 mmHg.   IAS/Shunts: No  atrial level shunt detected by color flow Doppler.        ECHO: LIMITED 05/26/2020  1. The mid to distal anteroseptal wall is akinetic. The apex is akinetic  with some portions dyskinetic, there is no signs of apical thrombus with  use of echocontrast. The mid to distal inferoseptal wall is akinetic. Marland Kitchen  Left ventricular ejection fraction, by estimation, is 35 to 40%. The left ventricle has moderately decreased function. Left ventricular diastolic parameters are consistent with Grade III diastolic dysfunction (restrictive). Elevated left atrial pressure.   2. Right ventricular systolic function is normal. The right ventricular  size is normal.   3. Left atrial size was mildly dilated.   4. The mitral valve is normal in structure. Mild mitral valve  regurgitation. No evidence of mitral stenosis.   5. The inferior vena cava is normal in size with greater than 50%  respiratory variability, suggesting right atrial pressure of 3 mmHg.   6. Limited echo to evaluate LV function and wall motion.    C[place]: 03/2014 [place]   LVEF >60%, mid LAD 30%, D1 mod to severe small vessel, distally pruned, distal 50-60% disease. LCX patent, OM2, mild disease with distal mod to severe, RCA diffuse moderate disease not ameanble to PCI   MYOVIEW: 02/01/2020 No diagnostic ST segment changes to indicate ischemia. Large, severe intensity, periapical defect extending into the anterolateral wall and inferior septum that is largely fixed and consistent with infarct scar. There is a small region of apical anterolateral reversibility indicating a mild ischemic territory. This is a high risk study based on defect size although region is most consistent with scar and a mild region of peri-infarct ischemia. Nuclear stress EF: 48%. Large area of periapical akinesis.    Laboratory Data:  High Sensitivity Troponin:   Recent Labs  Lab 01/08/22 1326 01/08/22 1920 01/11/22 1001  TROPONINIHS 1,304* 860* 3,869*      Chemistry Recent Labs  Lab 01/10/22 0522 01/10/22 1757 01/11/22 0532 01/11/22 1005  NA 139 141 141  --   K 4.0 3.9 3.5  --   CL 107 108 109  --  CO2 18* 19* 20*  --   GLUCOSE 141* 175* 82  --   BUN 41* 42* 42*  --   CREATININE 4.17* 4.54* 4.16*  --   CALCIUM 7.2* 7.6* 8.1*  --   MG 1.9  --  1.9 1.9  GFRNONAA 13* 12* 13*  --   ANIONGAP 14 14 12   --     Recent Labs  Lab 01/08/22 1326 01/08/22 2014  PROT 5.6* 4.4*  ALBUMIN 2.6* 2.1*  AST 18 34  ALT 14 19  ALKPHOS 73 67  BILITOT 0.4 0.5   Lipids  Recent Labs  Lab 01/09/22 0503  CHOL 85  TRIG 154*  156*  HDL 15*  LDLCALC 39  CHOLHDL 5.7    Hematology Recent Labs  Lab 01/10/22 0522 01/10/22 1509 01/11/22 0532  WBC 19.4* 21.5* 17.3*  RBC 2.41* 2.73* 2.70*  HGB 7.3* 8.4* 8.2*  HCT 21.6* 25.0* 24.2*  MCV 89.6 91.6 89.6  MCH 30.3 30.8 30.4  MCHC 33.8 33.6 33.9  RDW 14.4 14.5 14.3  PLT 315 293 317   Thyroid No results for input(s): "TSH", "FREET4" in the last 168 hours.  BNPNo results for input(s): "BNP", "PROBNP" in the last 168 hours.  DDimer No results for input(s): "DDIMER" in the last 168 hours.   Radiology/Studies:  DG CHEST PORT 1 VIEW  Result Date: 01/11/2022 CLINICAL DATA:  Acute respiratory distress EXAM: PORTABLE CHEST 1 VIEW COMPARISON:  01/09/2022 FINDINGS: No significant change in AP portable chest radiograph. Cardiomegaly with mild, diffuse bilateral interstitial opacity and probable small, layering pleural effusions. No new or focal airspace opacity. Endotracheal tube tip position is generally obscured by overlying esophagogastric tube but appears appropriate over the midtrachea. Esophagogastric tube with tip and side port below the diaphragm. IMPRESSION: 1. No significant change in AP portable chest radiograph. Cardiomegaly with mild, diffuse bilateral interstitial opacity and probable small, layering pleural effusions most likely edema. No new or focal airspace opacity. 2. Endotracheal tube  tip position is generally obscured by overlying esophagogastric tube but appears appropriate over the midtrachea. Electronically Signed   By: Delanna Ahmadi M.D.   On: 01/11/2022 11:19   DG Abd Portable 1V  Result Date: 01/10/2022 CLINICAL DATA:  NG tube placement EXAM: PORTABLE ABDOMEN - 1 VIEW COMPARISON:  None Available. FINDINGS: Enteric tube terminates in the gastric antrum. Nonobstructive bowel gas pattern. Cholecystectomy clips. IMPRESSION: Enteric tube terminates in the gastric antrum. Electronically Signed   By: Julian Hy M.D.   On: 01/10/2022 19:06   US RENAL  Result Date: 01/10/2022 CLINICAL DATA:  Renal dysfunction EXAM: RENAL / URINARY TRACT ULTRASOUND COMPLETE COMPARISON:  01/01/2020 FINDINGS: Right Kidney: Renal measurements: 9.2 x 4.8 x 6.1 cm = volume: 141.7 mL. There is no hydronephrosis. There is increased cortical echogenicity. Left Kidney: Renal measurements: Lennie Odor by 6.2 x 4.8 cm = volume: 171.8 mL. There is no hydronephrosis. There is increased cortical echogenicity. Bladder: Foley catheter is seen in the bladder. Urinary bladder is not distended and not adequately evaluated. Other: None. IMPRESSION: There is no hydronephrosis. Increased cortical echogenicity in the kidneys suggests medical renal disease. Electronically Signed   By: Elmer Picker M.D.   On: 01/10/2022 15:17   ECHOCARDIOGRAM COMPLETE BUBBLE STUDY  Result Date: 01/10/2022    ECHOCARDIOGRAM REPORT   Patient Name:   RONNETT PULLIN Date of Exam: 01/10/2022 Medical Rec #:  [id number]       Height:       65.0 in Accession #:  [id number]      Weight:       209.0 lb Date of Birth:  [birth year]       BSA:          2.015 m Patient Age:    68 years        BP:           121/65 mmHg Patient Gender: F               HR:           107 bpm. Exam Location:  Inpatient Procedure: 2D Echo, Color Doppler, Cardiac Doppler and Saline Contrast Bubble            Study Indications:     Stroke  History:         Patient has prior  history of Echocardiogram examinations, most                  recent 11/19/2021. CHF, CAD, Stroke; Risk Factors:Diabetes.  Sonographer:     Memory Argue Sonographer#2:   Johny Chess RDCS Referring Phys:  [id number] Lorenza Chick Diagnosing Phys: Gwyndolyn Kaufman MD IMPRESSIONS  1. There is akinesis of the mid-to-apical septal LV segments, all apical segments, and apex. There is significant thinning of the mid-to-apical septal segments and apex likely represents scarring/infarct. The basal-to-mid inferior and basal septal segments are hypokinetic.  2. No LV thrombus visualized on definity imaging.  3. Left ventricular ejection fraction, by estimation, is 30 to 35%. The left ventricle has moderately decreased function. The left ventricle demonstrates regional wall motion abnormalities (see scoring diagram/findings for description). The left ventricular internal cavity size was mildly-to-moderately dilated. There is mild concentric left ventricular hypertrophy. Indeterminate diastolic filling due to E-A fusion.  4. Right ventricular systolic function is normal. The right ventricular size is normal.  5. The mitral valve is normal in structure. Trivial mitral valve regurgitation.  6. The aortic valve is tricuspid. Aortic valve regurgitation is not visualized. No aortic stenosis is present.  7. The inferior vena cava is dilated in size with >50% respiratory variability, suggesting right atrial pressure of 8 mmHg.  8. Agitated saline contrast bubble study was negative, with no evidence of any interatrial shunt. Comparison(s): Compared to prior TTE on 10/2021, the EF has dropped to 30-35% with similar wall motion. FINDINGS  Left Ventricle: There is akinesis of the mid-to-apical septal LV segments, all apical segments, and apex. There is significant thinning of the mid-to-apical septal segments and apex which likely represents scarring/infarct. The basal-to-mid inferior and  basal septal segments are hypokinetic. No  LV thrombus visualized. Left ventricular ejection fraction, by estimation, is 30 to 35%. The left ventricle has moderately decreased function. The left ventricle demonstrates regional wall motion abnormalities. Definity contrast agent was given IV to delineate the left ventricular endocardial borders. The left ventricular internal cavity size was mildly to moderately dilated. There is mild concentric left ventricular hypertrophy. Indeterminate diastolic filling  due to E-A fusion. Right Ventricle: The right ventricular size is normal. No increase in right ventricular wall thickness. Right ventricular systolic function is normal. Left Atrium: Left atrial size was normal in size. Right Atrium: Right atrial size was normal in size. Pericardium: There is no evidence of pericardial effusion. Mitral Valve: The mitral valve is normal in structure. Trivial mitral valve regurgitation. Tricuspid Valve: The tricuspid valve is normal in structure. Tricuspid valve regurgitation is trivial. Aortic Valve: The aortic valve is tricuspid. Aortic valve regurgitation is not visualized. No  aortic stenosis is present. Aortic valve mean gradient measures 4.0 mmHg. Aortic valve peak gradient measures 7.4 mmHg. Aortic valve area, by VTI measures 2.86 cm. Pulmonic Valve: The pulmonic valve was normal in structure. Pulmonic valve regurgitation is trivial. Aorta: The aortic root is normal in size and structure. Venous: The inferior vena cava is dilated in size with greater than 50% respiratory variability, suggesting right atrial pressure of 8 mmHg. IAS/Shunts: The atrial septum is grossly normal. Agitated saline contrast was given intravenously to evaluate for intracardiac shunting. Agitated saline contrast bubble study was negative, with no evidence of any interatrial shunt.  LEFT VENTRICLE PLAX 2D LVIDd:         4.80 cm LVIDs:         3.40 cm LV PW:         1.00 cm LV IVS:        1.10 cm LVOT diam:     2.00 cm LV SV:         57 LV SV  Index:   29 LVOT Area:     3.14 cm  LV Volumes (MOD) LV vol d, MOD A2C: 170.0 ml LV vol d, MOD A4C: 112.0 ml LV vol s, MOD A2C: 108.0 ml LV vol s, MOD A4C: 61.0 ml LV SV MOD A2C:     62.0 ml LV SV MOD A4C:     112.0 ml LV SV MOD BP:      56.6 ml RIGHT VENTRICLE RV S prime:     14.10 cm/s LEFT ATRIUM             Index        RIGHT ATRIUM           Index LA diam:        4.00 cm 1.98 cm/m   RA Area:     10.40 cm LA Vol (A2C):   60.2 ml 29.87 ml/m  RA Volume:   18.80 ml  9.33 ml/m LA Vol (A4C):   43.2 ml 21.43 ml/m LA Biplane Vol: 51.0 ml 25.30 ml/m  AORTIC VALVE AV Area (Vmax):    2.82 cm AV Area (Vmean):   2.71 cm AV Area (VTI):     2.86 cm AV Vmax:           136.00 cm/s AV Vmean:          94.500 cm/s AV VTI:            0.201 m AV Peak Grad:      7.4 mmHg AV Mean Grad:      4.0 mmHg LVOT Vmax:         122.00 cm/s LVOT Vmean:        81.600 cm/s LVOT VTI:          0.183 m LVOT/AV VTI ratio: 0.91  AORTA Ao Root diam: 2.90 cm MR Peak grad: 53.3 mmHg MR Vmax:      365.00 cm/s SHUNTS                           Systemic VTI:  0.18 m                           Systemic Diam: 2.00 cm Gwyndolyn Kaufman MD Electronically signed by Gwyndolyn Kaufman MD Signature Date/Time: 01/10/2022/2:00:22 PM    Final (Updated)    MR ANGIO HEAD WO CONTRAST  Result Date: 01/09/2022 CLINICAL DATA:  Status post revascularization of occluded extracranial ICA. EXAM: MRA HEAD WITHOUT CONTRAST TECHNIQUE: Angiographic images of the Circle of Willis were acquired using MRA technique without intravenous contrast. COMPARISON:  Cerebral arteriogram 01/08/2022 FINDINGS: Anterior circulation: Segmental signal is present in the high cervical segment of the ICA. No signal is visualized in the petrous are pre cavernous right ICA. Flow signal is present in the cavernous and supraclinoid right ICA as well as the posterior communicating artery. More normal flow is present in the high left cervical ICA. Signal loss in the supraclinoid left ICA may be  artifactual. ICA terminus signal is within normal limits. The left A1 segment is dominant. Left M1 segment is normal. Flow is present in the right M1 segment. The MCA branch vessels are intact bilaterally vocal robust on the left. ACA branch vessels are within normal limits bilaterally. Posterior circulation: The left vertebral artery is the dominant vessel. No flow is present in the right vertebral artery below the PICA. The basilar artery is normal. The left posterior cerebral artery originates from the basilar tip. The right posterior cerebral artery is of fetal type. The right P1 segment is present. PCA branch vessels are within normal limits bilaterally. Anatomic variants: Fetal type right posterior cerebral artery. Other: None. IMPRESSION: 1. Abnormal flow signal within the petrous and pre cavernous right ICA may represent artifact from stent or slow flow 2. Signal loss in the supraclinoid left ICA may be artifactual. 3. Asymmetric diffused decreased size of right MCA branch vessels compared to the left likely reflects decreased perfusion. No significant proximal stenosis or occlusion. Electronically Signed   By: San Morelle M.D.   On: 01/09/2022 17:17   MR BRAIN WO CONTRAST  Result Date: 01/09/2022 CLINICAL DATA:  Status post revascularization of occluded cervical ICA. EXAM: MRI HEAD WITHOUT CONTRAST TECHNIQUE: Multiplanar, multiecho pulse sequences of the brain and surrounding structures were obtained without intravenous contrast. COMPARISON:  CT head without contrast 01/08/2022 FINDINGS: Brain: Diffusion-weighted images demonstrate extensive patchy restricted diffusion throughout the MCA territory. Most confluent areas are along the watershed. There is significant sparing of cortex within the right MCA territory although scattered foci of acute infarct are noted. T2 and FLAIR hyperintensities are associated with the areas of acute infarction. Most consistent cortical infarct is in the anterior  right frontal lobe along the watershed. Focal nonhemorrhagic infarct is also present within the genu of the corpus callosum and more posteriorly along the body of the corpus callosum on the right. No acute hemorrhage is present. White matter changes extend into the brainstem. The internal auditory canals are within normal limits. Cerebellum is within normal limits. Vascular: Intravascular contrast is present in the left internal carotid artery but not the right. This likely represents T1 shortening from height noted contrast. Branch vessels are seen in the [place] territory and left MCA territory but not on the right. Flow is present in the posterior circulation. Skull and upper cervical spine: The craniocervical junction is normal. Upper cervical spine is within normal limits. Marrow signal is unremarkable. Sinuses/Orbits: Fluid levels are present the maxillary sinuses and throughout the ethmoid air cells. Mild mucosal thickening is present in the frontal sinuses bilaterally. Fluid levels are present in the sphenoid sinuses. Small mastoid effusions are present. The globes and orbits are within normal limits. IMPRESSION: 1. Extensive patchy areas of acute nonhemorrhagic infarction involving the right MCA territory, most confluent areas along the watershed. 2. Focal nonhemorrhagic infarct involving the genu of the corpus callosum and more posteriorly along  the body of the corpus callosum on the right. 3. Diffuse sinus disease is likely secondary to intubation. Electronically Signed   By: San Morelle M.D.   On: 01/09/2022 17:09   DG Abd Portable 1V  Result Date: 01/09/2022 CLINICAL DATA:  Orogastric tube placement EXAM: PORTABLE ABDOMEN - 1 VIEW COMPARISON:  CT abdomen pelvis 06/23/2014 FINDINGS: Orogastric tube terminates in the region of the gastric antrum. Mild left basilar atelectasis. No dilated loops of bowel identified within the visualized upper abdomen. Central venous catheter tip located at the  cavoatrial junction. IMPRESSION: Orogastric tube terminates in the region of the gastric antrum. Electronically Signed   By: Miachel Roux M.D.   On: 01/09/2022 10:07   DG Chest Port 1 View  Result Date: 01/09/2022 CLINICAL DATA:  Endotracheal tube placement. EXAM: PORTABLE CHEST 1 VIEW COMPARISON:  01/09/2022 earlier. FINDINGS: Endotracheal tube has tip 4.7 cm above the carina. Left IJ central venous catheter unchanged with tip at the cavoatrial junction. Lungs are hypoinflated with persistent hazy right perihilar/paramediastinal opacification and mild prominence of the central pulmonary vasculature. Stable mild opacification in the left base/retrocardiac region. No pneumothorax. Cardiomediastinal silhouette and remainder of the exam is unchanged. IMPRESSION: 1. Stable hazy opacification over the right perihilar/paramediastinal region and left base/retrocardiac region. Findings may be due to asymmetric edema versus infection. Suggestion of mild vascular congestion. 2. Tubes and lines as described. Electronically Signed   By: Marin Olp M.D.   On: 01/09/2022 09:21   DG CHEST PORT 1 VIEW  Result Date: 01/09/2022 CLINICAL DATA:  Status post intubation. EXAM: PORTABLE CHEST 1 VIEW COMPARISON:  Chest radiograph dated 01/08/2021. FINDINGS: Interval retraction of the endotracheal tube with tip now approximately 4 cm above the carina. Left IJ central venous line in similar position. Similar or slightly worsened scratch pulmonary hazy and streaky densities, likely combination of atelectasis and vascular congestion and edema. Pneumonia is not excluded. Trace left pleural effusion suspected. No pneumothorax. No acute osseous pathology. IMPRESSION: Interval retraction of the endotracheal tube with tip now approximately 4 cm above the carina. Similar or slightly worsened pulmonary opacities in the right upper lobe compared to the earlier radiograph. Electronically Signed   By: Anner Crete M.D.   On: 01/09/2022  01:04   CT HEAD WO CONTRAST (5MM)  Result Date: 01/08/2022 CLINICAL DATA:  Stroke follow-up EXAM: CT HEAD WITHOUT CONTRAST TECHNIQUE: Contiguous axial images were obtained from the base of the skull through the vertex without intravenous contrast. RADIATION DOSE REDUCTION: This exam was performed according to the departmental dose-optimization program which includes automated exposure control, adjustment of the mA and/or kV according to patient size and/or use of iterative reconstruction technique. COMPARISON:  01/08/2022 CT head FINDINGS: Brain: Redemonstrated hypodensity in the right frontal periventricular white matter. No additional hypodensity. No evidence of acute hemorrhage, mass, mass effect, or midline shift. No hydrocephalus or extra-axial fluid collection. Vascular: No hyperdense vessel. Skull: Normal. Negative for fracture or focal lesion. Sinuses/Orbits: Mucosal thickening throughout the paranasal sinuses, with air-fluid levels with bubbly fluid in the maxillary sinuses and right sphenoid sinus. Fluid in the nasopharynx is likely related to intubation. The orbits are unremarkable. Other: The mastoid air cells are well aerated. IMPRESSION: 1. Redemonstrated hypodensity in the right frontal periventricular white matter. No additional acute intracranial process. No additional acute infarct or hemorrhage. 2. Air-fluid levels with bubbly fluid in the maxillary sinuses and right sphenoid sinus are likely related to intubation but can be seen in the setting of sinusitis. Electronically  Signed   By: Merilyn Baba M.D.   On: 01/08/2022 21:57   DG Chest Portable 1 View  Result Date: 01/08/2022 CLINICAL DATA:  Chest pain. EXAM: PORTABLE CHEST 1 VIEW COMPARISON:  Chest radiograph dated 06/18/2020. FINDINGS: Endotracheal tube with tip just above the carina. Recommend retraction by 3 cm for optimal positioning. Left IJ central venous line with tip at the cavoatrial junction. There is shallow inspiration. There  is cardiomegaly with vascular congestion and edema. Pneumonia is not excluded. Clinical correlation is recommended. Probable trace left pleural effusion. No pneumothorax. No acute osseous pathology. IMPRESSION: 1. Endotracheal tube with tip just above the carina. Recommend retraction by 3 cm for optimal positioning. 2. Cardiomegaly with vascular congestion and edema. Pneumonia is not excluded. Electronically Signed   By: Anner Crete M.D.   On: 01/08/2022 20:16   CT ANGIO HEAD NECK W WO CM (CODE STROKE)  Result Date: 01/08/2022 CLINICAL DATA:  Neuro deficit, acute, stroke suspected; [id number] EXAM: CT ANGIOGRAPHY HEAD AND NECK CT PERFUSION BRAIN TECHNIQUE: Multidetector CT imaging of the head and neck was performed using the standard protocol during bolus administration of intravenous contrast. Multiplanar CT image reconstructions and MIPs were obtained to evaluate the vascular anatomy. Carotid stenosis measurements (when applicable) are obtained utilizing NASCET criteria, using the distal internal carotid diameter as the denominator. Multiphase CT imaging of the brain was performed following IV bolus contrast injection. Subsequent parametric perfusion maps were calculated using RAPID software. RADIATION DOSE REDUCTION: This exam was performed according to the departmental dose-optimization program which includes automated exposure control, adjustment of the mA and/or kV according to patient size and/or use of iterative reconstruction technique. CONTRAST:  73m OMNIPAQUE IOHEXOL 350 MG/ML SOLN COMPARISON:  CT head from the same day. FINDINGS: CTA NECK FINDINGS Aortic arch: Great vessel origins are patent. Right carotid system: Common carotid artery is patent without significant stenosis. The ICA is occluded proximally with non opacification of the remainder of the neck. Left carotid system: Mild atherosclerosis at the carotid bifurcation without significant (greater than 50%) stenosis. Vertebral arteries:  Small/non dominant right vertebral artery, which is irregular throughout its V2 segment and occluded at the V3 segment with non opacification of the proximal intradural vertebral artery and reconstitution of the distal intradural vertebral artery. Dominant left intradural vertebral artery is patent throughout the neck without significant) greater than 50%) stenosis. Skeleton: No acute findings. Other neck: No acute findings Upper chest: Moderate layering bilateral pleural effusions. Peribronchial thickening. Review of the MIP images confirms the above findings CTA HEAD FINDINGS Anterior circulation: Non-opacified right intracranial ICA. Small but opacified right M1 MCA and proximal right M2 MCA branches. Severe left paraclinoid ICA stenosis. Left MCA and ACAs are patent. Posterior circulation: Proximal right intradural vertebral artery is not opacified. Distal intradural vertebral artery is opacified, likely from collaterals and retrograde flow. Left intradural vertebral artery, basilar artery and bilateral posterior cerebral arteries are patent without proximal hemodynamically significant stenosis. Venous sinuses: As permitted by contrast timing, patent. Review of the MIP images confirms the above findings CT Brain Perfusion Findings: ASPECTS: 10. CBF (<30%) Volume: 075mPerfusion (Tmax>6.0s) volume: 35m735merfusion (Tmax>4.0s) volume: 38 mL Mismatch Volume: 35mL35mfarction Location:None identified. IMPRESSION: 1. 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 Right MCA and A1 ACA are patent, but diminutive. 2. Small/non dominant right vertebral artery, which is irregular throughout its V2 segment with age indeterminate occlusion at the V3 segment  with non opacification of the proximal intradural vertebral artery and reconstitution of the distal intradural vertebral artery. 3. Severe left paraclinoid ICA stenosis. 4. One CT  perfusion, no evidence of core infarct or penumbra; however, there is approximately 38 mL of T-max greater than 4 seconds in the right MCA territory which suggests possible oligemia. An MRI could provide more sensitive evaluation for acute infarct. 5. Moderate layering bilateral pleural effusions and peribronchial wall thickening. Recommend dedicated chest imaging. Code stroke imaging results were communicated on 01/08/2022 at 2:16 pm to provider Bhagat via telephone, who verbally acknowledged these results. Electronically Signed   By: Margaretha Sheffield M.D.   On: 01/08/2022 14:19   CT CEREBRAL PERFUSION W CONTRAST  Result Date: 01/08/2022 CLINICAL DATA:  Neuro deficit, acute, stroke suspected; [id number] EXAM: CT ANGIOGRAPHY HEAD AND NECK CT PERFUSION BRAIN TECHNIQUE: Multidetector CT imaging of the head and neck was performed using the standard protocol during bolus administration of intravenous contrast. Multiplanar CT image reconstructions and MIPs were obtained to evaluate the vascular anatomy. Carotid stenosis measurements (when applicable) are obtained utilizing NASCET criteria, using the distal internal carotid diameter as the denominator. Multiphase CT imaging of the brain was performed following IV bolus contrast injection. Subsequent parametric perfusion maps were calculated using RAPID software. RADIATION DOSE REDUCTION: This exam was performed according to the departmental dose-optimization program which includes automated exposure control, adjustment of the mA and/or kV according to patient size and/or use of iterative reconstruction technique. CONTRAST:  37m OMNIPAQUE IOHEXOL 350 MG/ML SOLN COMPARISON:  CT head from the same day. FINDINGS: CTA NECK FINDINGS Aortic arch: Great vessel origins are patent. Right carotid system: Common carotid artery is patent without significant stenosis. The ICA is occluded proximally with non opacification of the remainder of the neck. Left carotid system: Mild  atherosclerosis at the carotid bifurcation without significant (greater than 50%) stenosis. Vertebral arteries: Small/non dominant right vertebral artery, which is irregular throughout its V2 segment and occluded at the V3 segment with non opacification of the proximal intradural vertebral artery and reconstitution of the distal intradural vertebral artery. Dominant left intradural vertebral artery is patent throughout the neck without significant) greater than 50%) stenosis. Skeleton: No acute findings. Other neck: No acute findings Upper chest: Moderate layering bilateral pleural effusions. Peribronchial thickening. Review of the MIP images confirms the above findings CTA HEAD FINDINGS Anterior circulation: Non-opacified right intracranial ICA. Small but opacified right M1 MCA and proximal right M2 MCA branches. Severe left paraclinoid ICA stenosis. Left MCA and ACAs are patent. Posterior circulation: Proximal right intradural vertebral artery is not opacified. Distal intradural vertebral artery is opacified, likely from collaterals and retrograde flow. Left intradural vertebral artery, basilar artery and bilateral posterior cerebral arteries are patent without proximal hemodynamically significant stenosis. Venous sinuses: As permitted by contrast timing, patent. Review of the MIP images confirms the above findings CT Brain Perfusion Findings: ASPECTS: 10. CBF (<30%) Volume: 045mPerfusion (Tmax>6.0s) volume: 36m46merfusion (Tmax>4.0s) volume: 38 mL Mismatch Volume: 36mL69mfarction Location:None identified. IMPRESSION: 1. 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 Right MCA and A1 ACA are patent, but diminutive. 2. Small/non dominant right vertebral artery, which is irregular throughout its V2 segment with age indeterminate occlusion at the V3 segment with non opacification of the proximal intradural vertebral artery and  reconstitution of the distal intradural vertebral artery. 3. Severe left paraclinoid ICA stenosis. 4. One CT perfusion, no evidence of core  infarct or penumbra; however, there is approximately 38 mL of T-max greater than 4 seconds in the right MCA territory which suggests possible oligemia. An MRI could provide more sensitive evaluation for acute infarct. 5. Moderate layering bilateral pleural effusions and peribronchial wall thickening. Recommend dedicated chest imaging. Code stroke imaging results were communicated on 01/08/2022 at 2:16 pm to provider Bhagat via telephone, who verbally acknowledged these results. Electronically Signed   By: Margaretha Sheffield M.D.   On: 01/08/2022 14:19   CT HEAD CODE STROKE WO CONTRAST  Result Date: 01/08/2022 CLINICAL DATA:  Code stroke.  Left arm weakness.  Left facial droop. EXAM: CT HEAD WITHOUT CONTRAST TECHNIQUE: Contiguous axial images were obtained from the base of the skull through the vertex without intravenous contrast. RADIATION DOSE REDUCTION: This exam was performed according to the departmental dose-optimization program which includes automated exposure control, adjustment of the mA and/or kV according to patient size and/or use of iterative reconstruction technique. COMPARISON:  CT head without contrast 12/12/2021 FINDINGS: Brain: Periventricular white matter hypodensities adjacent to the frontal horn of the right lateral ventricles and in the right corona radiata are new since prior exam. No acute or focal cortical abnormality is present. Basal ganglia are intact. Insular ribbon is normal. The ventricles are of normal size. No significant extraaxial fluid collection is present. The brainstem and cerebellum are within normal limits. Vascular: Atherosclerotic calcifications are again seen within the cavernous internal carotid arteries. No hyperdense vessel is present. No significant interval change is present. Skull: Calvarium is intact. No focal lytic or blastic  lesions are present. No significant extracranial soft tissue lesion is present. Sinuses/Orbits: Mild mucosal thickening is present maxillary sinuses bilaterally. The paranasal sinuses and mastoid air cells are otherwise clear. The globes and orbits are within normal limits. ASPECTS [facility] Stroke Program Early CT Score) - Ganglionic level infarction (caudate, lentiform nuclei, internal capsule, insula, M1-M3 cortex): 7/7 - Supraganglionic infarction (M4-M6 cortex): 3/3 Total score (0-10 with 10 being normal): 10/10 IMPRESSION: 1. Periventricular white matter hypodensities adjacent to the frontal horn of the right lateral ventricles and in the right corona radiata are new since prior exam. This may represent acute ischemia. 2. No acute or focal cortical abnormality. 3. ASPECTS is 10/10. The above was relayed via text pager to Dr. Curly Shores on 01/08/2022 at 13:39 . Electronically Signed   By: San Morelle M.D.   On: 01/08/2022 13:41     Assessment and Plan:   Elevated troponin to 3,869 today (in AKI) was elevated to 1300 on arrival but decreased in ER to 860 in ER.   EKG with some changes and known CAD with cath in SR in 2015 with mod to severe dz D1, OM2, RCA and unable to do PCI.  Currently with elevated Cr at 4 would treat medically unless STEMI she is on Brilinta and ASA  she was given morphine this AM.   Cardiomyopathy with hx of EF this admit  EF 30-35%  and akinesis of the mid-to-apical septal LV segments, all apical  segments, and apex. There is significant thinning of the mid-to-apical  septal segments and apex likely represents scarring/infarct. The  basal-to-mid inferior and basal septal  segments are hypokinetic. In 2023 EF was 40-45% and G2DD.   In 2022 EF 35-40% with G3DD.  So some decrease in EF.  Acute CVA Imaging revealed: age indeterminate occlusion of proximal right ICA, V3 segment of right vertebral artery, left paraclinoid ICA stenosis. No core infarct or penumbra. Patient went  immediately to IR for thrombectomy.  Of note, patient with recent arterial thrombectomy of the right upper extremity due to limb ischemia.  Now on asa and Brilinta and not on eliquis.   Per Neuro, +dysphasia, goal SBP 120-140  Recent thrombectomy of Rt upper ext.  ? Need for hematology consult no atrial fib and Echo in July no thrombus was placed on Eliquis.  Chronic systolic and diastolic    HF.  See above re'd lasix 60 today for pulmonary edema on CXR per nephrology.  Anemia has rec'd 1 uPRBCs. Hgb today 8.2  Acute exacerbation of COPD/bronchospasm/ Rhinovirus +, tobacco use per CCM  on solumedrol. On BIpap placed back on at 1600 AKI per nephrology contrast induced  DM-1 per CCM  Osteomyelitis Lt foot with ulcer per CCM on  ABX.    Risk Assessment/Risk Scores:     TIMI Risk Score for Unstable Angina or Non-ST Elevation MI:   The patient's TIMI risk score is  , which indicates a  % risk of all cause mortality, new or recurrent myocardial infarction or need for urgent revascularization in the next 14 days.  New York Heart Association (NYHA) Functional Class NYHA Class III For questions or updates, please contact [place] Please consult [email or web address] for contact info under  Signed, Cecilie Kicks, NP  01/11/2022 1:10 PM  Patient seen and examined, note reviewed with the signed Advanced Practice Provider. I personally reviewed laboratory data, imaging studies and relevant notes. I independently examined the patient and formulated the important aspects of the plan. I have personally discussed the plan with the patient and/or family. Comments or changes to the note/plan are indicated below.  Patient was seen and examined at her bedside. Her aunt was at her bedside. Patient on bipap - not really communicating with me. Most of HPI was obtained from the chart review. She was admitted for acute CVA status post thrombectomy.  While admitted her troponin has increased as well as EF has  decreased.  At this time we treat the patient medically would not pursue ischemic evaluation given her current clinical picture.  Thank you for consultation we will continue to follow with you.  Berniece Salines DO, MS [facility] Attending Cardiologist [place]  [address] [place], [place] [address] [phone] Website: [email or web address]

## 2022-01-11 NOTE — Progress Notes (Signed)
? ?  Inpatient Rehab Admissions Coordinator : ? ?Per therapy recommendations, patient was screened for CIR candidacy by Maximo Spratling RN MSN.  At this time patient appears to be a potential candidate for CIR. I will place a rehab consult per protocol for full assessment. Please call me with any questions. ? ?Mariska Daffin RN MSN ?Admissions Coordinator ?[phone] ?  ?

## 2022-01-11 NOTE — Evaluation (Signed)
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 ([place]) 11/18/2021   Cellulitis of left foot 09/02/2021   Acute osteomyelitis of left foot ([place]) 09/02/2021   Foot ulcer ([place]) 08/27/2021   Sepsis ([place]) 08/27/2021   Hypokalemia 08/27/2021   Cellulitis 06/18/2021   Chronic combined systolic and diastolic CHF (congestive heart failure) ([place]) 06/18/2021   History of CVA (cerebrovascular accident) 06/18/2021   Encounter for cervical Pap smear with pelvic exam 07/23/2020   Menorrhagia with regular cycle 07/23/2020   Iron deficiency anemia due to chronic blood loss 07/23/2020   Menstrual cramps 07/23/2020   Class 2 obesity 06/18/2020   CAD (coronary artery disease) 06/18/2020   CKD stage G3b/A2, GFR 30-44 and albumin creatinine ratio 30-299 mg/g (HCC) 06/18/2020   Unstable angina ([employer]) 06/18/2020   Mixed hyperlipidemia    Class 2 obesity due to excess calories without serious comorbidity with body mass index (BMI) of 39.0 to 39.9 in adult    Acute renal failure superimposed on stage 3a chronic kidney disease ([place]) 01/01/2020   Acute ischemic stroke ([place]) 01/01/2020   Tobacco abuse 01/01/2020   Chest pain 12/31/2019   Cerebrovascular accident (CVA) ([place]) 12/31/2019   Left sided numbness 12/31/2019   Vitamin D deficiency 07/24/2015   Essential hypertension 03/17/2015   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 s/p cath in 03/2014 showing 30% mid-LAD, moderate to severe disease along small D1, patent LCx, moderate to severe distal OM2 stenosis and moderate diffuse diease along RCA not amenable to PCI   CHF (congestive heart failure) ([place])    a. EF 55-60% in 12/2019 b. EF at 35-40% by echo in 05/2020   CKD (chronic kidney disease) stage 4, GFR 15-29 ml/min (HCC)    Diabetes mellitus without complication ([place])    Myocardial infarction ([place])    Neuropathy    Stroke [facility])    Past Surgical History:  Past Surgical History:  Procedure Laterality Date   AMPUTATION Left 09/02/2021   Procedure: AMPUTATION RAY;  Surgeon: Criselda Peaches, DPM;  Location: [place];  Service: Podiatry;  Laterality: Left;  sagittal saw, 3L bag saline & Pulse   Cardiac catherization     CHOLECYSTECTOMY     I & D EXTREMITY Left 08/30/2021   Procedure: IRRIGATION AND DEBRIDEMENT WITH BONE BIOPSY;  Surgeon: [name], DPM;  Location: [place];  Service: Podiatry;  Laterality: Left;   IRRIGATION AND DEBRIDEMENT FOOT Left 09/04/2021   Procedure: IRRIGATION AND DEBRIDEMENT FOOT AND CLOSURE;  Surgeon: Criselda Peaches, DPM;  Location: [place];  Service: Podiatry;  Laterality: Left;   RADIOLOGY WITH ANESTHESIA N/A 01/08/2022   Procedure: IR WITH ANESTHESIA;  Surgeon: Radiologist, Medication, MD;  Location: [place];  Service: Radiology;  Laterality: N/A;   THROMBECTOMY BRACHIAL ARTERY Right 11/18/2021   Procedure:  RIGHT BRACHIAL, RADIAL, & ULNAR ARTERY THROMBECTOMY.;  Surgeon: Marty Heck, MD;  Location[facility] OR;  Service: Vascular;  Laterality: Right;   TUBAL LIGATION     HPI:  Lauren [name] is a 41 y.o. female who presented to the ED for acute onset of chest pain with left side weakness. CT head right MCA and vertebral stroke; underwent IR thrombectomy of R ICA with R ICA dissection repair and  stent placement with TICI 3. Intubated 9/8-10.  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   Assessment / Plan / Recommendation Clinical Impression  Pt was seen for speech-language-cognition evaluation which was limited due to pt's reduced and waning alertness. Pt exhibited difficulty with attention (visual and auditory); this was not improved with tactile prompts and no visual tracking was noted. Pt accurately responded to two simple yes/no questions. Pt produced "yeah" once in response to a question, but no other verbal output was noted and pt inconsistently responded to other questions with gestures. Despite models and tactile cues, pt did not follow commands during the evaluation. Further skilled SLP services are clinically indicated for further assessment and for intervention.    SLP Assessment  SLP Recommendation/Assessment: Patient needs continued Speech Reading Pathology Services SLP Visit Diagnosis: Cognitive communication deficit (R41.841)    Recommendations for follow up therapy are one component of a multi-disciplinary discharge planning process, led by the attending physician.  Recommendations may be updated based on patient status, additional functional criteria and insurance authorization.    Follow Up Recommendations  Acute inpatient rehab (3hours/day)    Assistance Recommended at Discharge  Frequent or constant Supervision/Assistance  Functional Status Assessment Patient has had a recent decline in their functional status and demonstrates the ability to make significant improvements in function in a reasonable and predictable amount of time.  Frequency and Duration min 3x week  2 weeks      SLP  Evaluation Cognition  Overall Cognitive Status: Difficult to assess Arousal/Alertness: Lethargic Orientation Level:  (pt did not provide information) Attention: Focused;Sustained Focused Attention: Impaired Focused Attention Impairment: Verbal basic Sustained Attention: Impaired Sustained Attention Impairment: Verbal basic Problem Solving: Impaired Problem Solving Impairment: Verbal basic       Comprehension  Auditory Comprehension Overall Auditory Comprehension: Impaired Yes/No Questions: Impaired Basic Biographical Questions:  (2/5) Commands: Impaired One Step Basic Commands:  (0/4)    Expression Expression Primary Mode of Expression: Verbal Verbal Expression Overall Verbal Expression: Impaired Initiation: Impaired Automatic Speech: Counting (0/10) Level of Generative/Spontaneous Verbalization: Word Repetition: Impaired Level of Impairment: Word level Naming: Impairment Written Expression Dominant Hand: Right   Oral / Motor  Motor Speech Overall Motor Speech:  (Difficult to assess due to paucity of output) Phonation: Low vocal intensity Articulation: Impaired Level of Impairment: Word Intelligibility: Intelligibility reduced           Lauren [name], [place], [place] Office number [phone]  Horton Marshall 01/11/2022, 11:42 AM

## 2022-01-11 NOTE — Progress Notes (Addendum)
Lauren [name] Progress Note   Subjective:   Seen at bedside this AM, Lauren [name] present.  Lauren [name] isn't providing any history.  Remains aphasic.  Starting tube feeds.  EF 30-35% yesterday .  I/Os yest 1.1 / 2.5 with lasix  Objective Vitals:   01/11/22 1000 01/11/22 1100 01/11/22 1200 01/11/22 1251  BP: (!) 152/87 (!) 158/98 (!) 165/89   Pulse: (!) 111 (!) 102 (!) 107   Resp: (!) 31 (!) 25 (!) 30   Temp:   99 F (37.2 C)   TempSrc:   Axillary   SpO2: 93% 94% 93% 97%  Weight:      Height:       Physical Exam General: lying at 30 degrees writhing around Heart: RRR Lungs: clear Abdomen:soft, obese Extremities: no edema Neuro:  will localize some by L side not moving, not really following commands or interacting on my exam  Additional Objective Labs: Basic Metabolic Panel: Recent Labs  Lab 01/10/22 0522 01/10/22 1757 01/11/22 0532 01/11/22 1005  NA 139 141 141  --   K 4.0 3.9 3.5  --   CL 107 108 109  --   CO2 18* 19* 20*  --   GLUCOSE 141* 175* 82  --   BUN 41* 42* 42*  --   CREATININE 4.17* 4.54* 4.16*  --   CALCIUM 7.2* 7.6* 8.1*  --   PHOS 5.7*  --  5.7* 6.0*   Liver Function Tests: Recent Labs  Lab 01/08/22 1326 01/08/22 2014  AST 18 34  ALT 14 19  ALKPHOS 73 67  BILITOT 0.4 0.5  PROT 5.6* 4.4*  ALBUMIN 2.6* 2.1*   No results for input(s): "LIPASE", "AMYLASE" in the last 168 hours. CBC: Recent Labs  Lab 01/08/22 1326 01/08/22 1345 01/08/22 1940 01/08/22 2049 01/09/22 0503 01/10/22 0522 01/10/22 1509 01/11/22 0532  WBC 10.0  --  13.8*  --  26.5* 19.4* 21.5* 17.3*  NEUTROABS 7.3  --   --   --  26.0*  --   --   --   HGB 7.6*   < > 5.6*   < > 9.0* 7.3* 8.4* 8.2*  HCT 23.9*   < > 17.9*   < > 25.8* 21.6* 25.0* 24.2*  MCV 91.9  --  92.3  --  89.0 89.6 91.6 89.6  PLT 399  --  431*  --  327 315 293 317   < > = values in this interval not displayed.   Blood Culture    Component Value Date/Time   SDES TRACHEAL ASPIRATE 01/09/2022 0408    SPECREQUEST NONE 01/09/2022 0408   CULT  01/09/2022 0408    RARE Normal respiratory flora-no Staph aureus or Pseudomonas seen Performed at [facility], [place] [address], [place], [place] [address]    REPTSTATUS 01/11/2022 FINAL 01/09/2022 0408    Cardiac Enzymes: No results for input(s): "CKTOTAL", "CKMB", "CKMBINDEX", "TROPONINI" in the last 168 hours. CBG: Recent Labs  Lab 01/10/22 1949 01/10/22 2337 01/11/22 0339 01/11/22 0753 01/11/22 1150  GLUCAP 154* 99 88 105* 149*   Iron Studies: No results for input(s): "IRON", "TIBC", "TRANSFERRIN", "FERRITIN" in the last 72 hours. @lablastinr3 @ Studies/Results: DG CHEST PORT 1 VIEW  Result Date: 01/11/2022 CLINICAL DATA:  Acute respiratory distress EXAM: PORTABLE CHEST 1 VIEW COMPARISON:  01/09/2022 FINDINGS: No significant change in AP portable chest radiograph. Cardiomegaly with mild, diffuse bilateral interstitial opacity and probable small, layering pleural effusions. No new or focal airspace opacity. Endotracheal tube tip  position is generally obscured by overlying esophagogastric tube but appears appropriate over the midtrachea. Esophagogastric tube with tip and side port below the diaphragm. IMPRESSION: 1. No significant change in AP portable chest radiograph. Cardiomegaly with mild, diffuse bilateral interstitial opacity and probable small, layering pleural effusions most likely edema. No new or focal airspace opacity. 2. Endotracheal tube tip position is generally obscured by overlying esophagogastric tube but appears appropriate over the midtrachea. Electronically Signed   By: Delanna Ahmadi M.D.   On: 01/11/2022 11:19   DG Abd Portable 1V  Result Date: 01/10/2022 CLINICAL DATA:  NG tube placement EXAM: PORTABLE ABDOMEN - 1 VIEW COMPARISON:  None Available. FINDINGS: Enteric tube terminates in the gastric antrum. Nonobstructive bowel gas pattern. Cholecystectomy clips. IMPRESSION: Enteric tube terminates in the gastric antrum.  Electronically Signed   By: Julian Hy M.D.   On: 01/10/2022 19:06   US RENAL  Result Date: 01/10/2022 CLINICAL DATA:  Renal dysfunction EXAM: RENAL / URINARY TRACT ULTRASOUND COMPLETE COMPARISON:  01/01/2020 FINDINGS: Right Kidney: Renal measurements: 9.2 x 4.8 x 6.1 cm = volume: 141.7 mL. There is no hydronephrosis. There is increased cortical echogenicity. Left Kidney: Renal measurements: Lennie Odor by 6.2 x 4.8 cm = volume: 171.8 mL. There is no hydronephrosis. There is increased cortical echogenicity. Bladder: Foley catheter is seen in the bladder. Urinary bladder is not distended and not adequately evaluated. Other: None. IMPRESSION: There is no hydronephrosis. Increased cortical echogenicity in the kidneys suggests medical renal disease. Electronically Signed   By: Elmer Picker M.D.   On: 01/10/2022 15:17   ECHOCARDIOGRAM COMPLETE BUBBLE STUDY  Result Date: 01/10/2022    ECHOCARDIOGRAM REPORT   Patient Name:   Lauren [name] Date of Exam: 01/10/2022 Medical Rec #:  [id number]       Height:       65.0 in Accession #:    [id number]      Weight:       209.0 lb Date of Birth:  Jul 21, 1980       BSA:          2.015 m Patient Age:    41 years        BP:           121/65 mmHg Patient Gender: F               HR:           107 bpm. Exam Location:  Inpatient Procedure: 2D Echo, Color Doppler, Cardiac Doppler and Saline Contrast Bubble            Study Indications:     Stroke  History:         Patient has prior history of Echocardiogram examinations, most                  recent 11/19/2021. CHF, CAD, Stroke; Risk Factors:Diabetes.  Sonographer:     Memory Argue Sonographer#2:   Johny Chess RDCS Referring Phys:  [id number] Lorenza Chick Diagnosing Phys: Gwyndolyn Kaufman MD IMPRESSIONS  1. There is akinesis of the mid-to-apical septal LV segments, all apical segments, and apex. There is significant thinning of the mid-to-apical septal segments and apex likely represents scarring/infarct. The  basal-to-mid inferior and basal septal segments are hypokinetic.  2. No LV thrombus visualized on definity imaging.  3. Left ventricular ejection fraction, by estimation, is 30 to 35%. The left ventricle has moderately decreased function. The left ventricle demonstrates regional wall motion abnormalities (see scoring diagram/findings for description). The  left ventricular internal cavity size was mildly-to-moderately dilated. There is mild concentric left ventricular hypertrophy. Indeterminate diastolic filling due to E-A fusion.  4. Right ventricular systolic function is normal. The right ventricular size is normal.  5. The mitral valve is normal in structure. Trivial mitral valve regurgitation.  6. The aortic valve is tricuspid. Aortic valve regurgitation is not visualized. No aortic stenosis is present.  7. The inferior vena cava is dilated in size with >50% respiratory variability, suggesting right atrial pressure of 8 mmHg.  8. Agitated saline contrast bubble study was negative, with no evidence of any interatrial shunt. Comparison(s): Compared to prior TTE on 10/2021, the EF has dropped to 30-35% with similar wall motion. FINDINGS  Left Ventricle: There is akinesis of the mid-to-apical septal LV segments, all apical segments, and apex. There is significant thinning of the mid-to-apical septal segments and apex which likely represents scarring/infarct. The basal-to-mid inferior and  basal septal segments are hypokinetic. No LV thrombus visualized. Left ventricular ejection fraction, by estimation, is 30 to 35%. The left ventricle has moderately decreased function. The left ventricle demonstrates regional wall motion abnormalities. Definity contrast agent was given IV to delineate the left ventricular endocardial borders. The left ventricular internal cavity size was mildly to moderately dilated. There is mild concentric left ventricular hypertrophy. Indeterminate diastolic filling  due to E-A fusion. Right  Ventricle: The right ventricular size is normal. No increase in right ventricular wall thickness. Right ventricular systolic function is normal. Left Atrium: Left atrial size was normal in size. Right Atrium: Right atrial size was normal in size. Pericardium: There is no evidence of pericardial effusion. Mitral Valve: The mitral valve is normal in structure. Trivial mitral valve regurgitation. Tricuspid Valve: The tricuspid valve is normal in structure. Tricuspid valve regurgitation is trivial. Aortic Valve: The aortic valve is tricuspid. Aortic valve regurgitation is not visualized. No aortic stenosis is present. Aortic valve mean gradient measures 4.0 mmHg. Aortic valve peak gradient measures 7.4 mmHg. Aortic valve area, by VTI measures 2.86 cm. Pulmonic Valve: The pulmonic valve was normal in structure. Pulmonic valve regurgitation is trivial. Aorta: The aortic root is normal in size and structure. Venous: The inferior vena cava is dilated in size with greater than 50% respiratory variability, suggesting right atrial pressure of 8 mmHg. IAS/Shunts: The atrial septum is grossly normal. Agitated saline contrast was given intravenously to evaluate for intracardiac shunting. Agitated saline contrast bubble study was negative, with no evidence of any interatrial shunt.  LEFT VENTRICLE PLAX 2D LVIDd:         4.80 cm LVIDs:         3.40 cm LV PW:         1.00 cm LV IVS:        1.10 cm LVOT diam:     2.00 cm LV SV:         57 LV SV Index:   29 LVOT Area:     3.14 cm  LV Volumes (MOD) LV vol d, MOD A2C: 170.0 ml LV vol d, MOD A4C: 112.0 ml LV vol s, MOD A2C: 108.0 ml LV vol s, MOD A4C: 61.0 ml LV SV MOD A2C:     62.0 ml LV SV MOD A4C:     112.0 ml LV SV MOD BP:      56.6 ml RIGHT VENTRICLE RV S prime:     14.10 cm/s LEFT ATRIUM             Index  RIGHT ATRIUM           Index LA diam:        4.00 cm 1.98 cm/m   RA Area:     10.40 cm LA Vol (A2C):   60.2 ml 29.87 ml/m  RA Volume:   18.80 ml  9.33 ml/m LA Vol  (A4C):   43.2 ml 21.43 ml/m LA Biplane Vol: 51.0 ml 25.30 ml/m  AORTIC VALVE AV Area (Vmax):    2.82 cm AV Area (Vmean):   2.71 cm AV Area (VTI):     2.86 cm AV Vmax:           136.00 cm/s AV Vmean:          94.500 cm/s AV VTI:            0.201 m AV Peak Grad:      7.4 mmHg AV Mean Grad:      4.0 mmHg LVOT Vmax:         122.00 cm/s LVOT Vmean:        81.600 cm/s LVOT VTI:          0.183 m LVOT/AV VTI ratio: 0.91  AORTA Ao Root diam: 2.90 cm MR Peak grad: 53.3 mmHg MR Vmax:      365.00 cm/s SHUNTS                           Systemic VTI:  0.18 m                           Systemic Diam: 2.00 cm Gwyndolyn Kaufman MD Electronically signed by Gwyndolyn Kaufman MD Signature Date/Time: 01/10/2022/2:00:22 PM    Final (Updated)    MR ANGIO HEAD WO CONTRAST  Result Date: 01/09/2022 CLINICAL DATA:  Status post revascularization of occluded extracranial ICA. EXAM: MRA HEAD WITHOUT CONTRAST TECHNIQUE: Angiographic images of the Circle of Willis were acquired using MRA technique without intravenous contrast. COMPARISON:  Cerebral arteriogram 01/08/2022 FINDINGS: Anterior circulation: Segmental signal is present in the high cervical segment of the ICA. No signal is visualized in the petrous are pre cavernous right ICA. Flow signal is present in the cavernous and supraclinoid right ICA as well as the posterior communicating artery. More normal flow is present in the high left cervical ICA. Signal loss in the supraclinoid left ICA may be artifactual. ICA terminus signal is within normal limits. The left A1 segment is dominant. Left M1 segment is normal. Flow is present in the right M1 segment. The MCA branch vessels are intact bilaterally vocal robust on the left. ACA branch vessels are within normal limits bilaterally. Posterior circulation: The left vertebral artery is the dominant vessel. No flow is present in the right vertebral artery below the PICA. The basilar artery is normal. The left posterior cerebral artery  originates from the basilar tip. The right posterior cerebral artery is of fetal type. The right P1 segment is present. PCA branch vessels are within normal limits bilaterally. Anatomic variants: Fetal type right posterior cerebral artery. Other: None. IMPRESSION: 1. Abnormal flow signal within the petrous and pre cavernous right ICA may represent artifact from stent or slow flow 2. Signal loss in the supraclinoid left ICA may be artifactual. 3. Asymmetric diffused decreased size of right MCA branch vessels compared to the left likely reflects decreased perfusion. No significant proximal stenosis or occlusion. Electronically Signed   By: Wynetta Fines.D.  On: 01/09/2022 17:17   MR BRAIN WO CONTRAST  Result Date: 01/09/2022 CLINICAL DATA:  Status post revascularization of occluded cervical ICA. EXAM: MRI HEAD WITHOUT CONTRAST TECHNIQUE: Multiplanar, multiecho pulse sequences of the brain and surrounding structures were obtained without intravenous contrast. COMPARISON:  CT head without contrast 01/08/2022 FINDINGS: Brain: Diffusion-weighted images demonstrate extensive patchy restricted diffusion throughout the MCA territory. Most confluent areas are along the watershed. There is significant sparing of cortex within the right MCA territory although scattered foci of acute infarct are noted. T2 and FLAIR hyperintensities are associated with the areas of acute infarction. Most consistent cortical infarct is in the anterior right frontal lobe along the watershed. Focal nonhemorrhagic infarct is also present within the genu of the corpus callosum and more posteriorly along the body of the corpus callosum on the right. No acute hemorrhage is present. White matter changes extend into the brainstem. The internal auditory canals are within normal limits. Cerebellum is within normal limits. Vascular: Intravascular contrast is present in the left internal carotid artery but not the right. This likely represents T1  shortening from height noted contrast. Branch vessels are seen in the [place] territory and left MCA territory but not on the right. Flow is present in the posterior circulation. Skull and upper cervical spine: The craniocervical junction is normal. Upper cervical spine is within normal limits. Marrow signal is unremarkable. Sinuses/Orbits: Fluid levels are present the maxillary sinuses and throughout the ethmoid air cells. Mild mucosal thickening is present in the frontal sinuses bilaterally. Fluid levels are present in the sphenoid sinuses. Small mastoid effusions are present. The globes and orbits are within normal limits. IMPRESSION: 1. Extensive patchy areas of acute nonhemorrhagic infarction involving the right MCA territory, most confluent areas along the watershed. 2. Focal nonhemorrhagic infarct involving the genu of the corpus callosum and more posteriorly along the body of the corpus callosum on the right. 3. Diffuse sinus disease is likely secondary to intubation. Electronically Signed   By: San Morelle M.D.   On: 01/09/2022 17:09   Medications:   amoxicillin-clavulanate  1 tablet Per Tube Q12H   arformoterol  15 mcg Nebulization BID   aspirin  81 mg Oral Daily   Or   aspirin  81 mg Per Tube Daily   atorvastatin  40 mg Per Tube Daily   budesonide (PULMICORT) nebulizer solution  0.5 mg Nebulization BID   Chlorhexidine Gluconate Cloth  6 each Topical Daily   docusate  100 mg Per Tube BID   feeding supplement (PROSource TF20)  60 mL Per Tube Daily   feeding supplement (VITAL HIGH PROTEIN)  1,000 mL Per Tube Q24H   insulin aspart  0-15 Units Subcutaneous Q4H   insulin aspart  2 Units Subcutaneous Q4H   insulin glargine-yfgn  30 Units Subcutaneous Q24H   levothyroxine  75 mcg Per Tube Q0600   [START ON 01/12/2022] methylPREDNISolone (SOLU-MEDROL) injection  40 mg Intravenous Daily   mupirocin ointment  1 Application Nasal BID   nicotine  21 mg Transdermal Daily   pantoprazole  (PROTONIX) IV  40 mg Intravenous QHS   polyethylene glycol  17 g Per Tube Daily   revefenacin  175 mcg Nebulization Daily   ticagrelor  90 mg Oral BID   Or   ticagrelor  90 mg Per Tube BID   Assessment/ Plan: AKI on CKD IV - b/l creat 2.1- 2.3 from July 2023, eGFR 27-29 ml/min. Not sure who her renal MD is. Creat here was 2.0 on admission  for acute CVA. Pt went for thrombectomy 9/8 per IR w/ stent placement. Pt got > 1 IV contrast doses on 9/8 for diagnostic CT scans. Creat now up to 4.1 yesterday, 4.5 yest PM now 4.16 this AM. BP's mostly normal, no sig hypotension. UA + proteinuria which is chronic, no hematuria. Renal US c/w CKD, no obstruction.  AKI due to contrast nephropathy most likely.  UOP is adequate and responded to diuretic therapy.  Pt is poorly responsive but doubt uremia yet at her age with BUN << 100- likely neurologic sequelae. Will follow closely.   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 Improving.  Still net + 4.4L with supplemental O2 needs. Give lasix 60 IV x 1 dose today.  DM2 - per pmd CAD hx of MI  Jannifer Hick MD 01/11/2022, 2:33 PM  [name] Pager: [phone]

## 2022-01-11 NOTE — Significant Event (Incomplete)
Notified about pt w CP. Pt was immediately evaluated.  Patient is hemodynamically stable.  EKG reviewed no evidence of STEMI. Patient was previously evaluated and recommended to have medical treatment.  Continue with DAPT

## 2022-01-11 NOTE — Progress Notes (Addendum)
STROKE TEAM PROGRESS NOTE   INTERVAL HISTORY Husband at bedside and updated.  Patient is tachycardiac and tachypneic. She is still not speaking. Intermittently following commands, not as brisk as yesterday. PERRL, still with right gaze preference, does not cross midline. Left arm is flaccid, left leg withdraws to painful stimuli. Right side is spontaneous. Appears to be doing more mimicking today.  S Cr 4.16, BUN 42. Will need to have NGT placed and start TF.   Echo yesterday EF 30-35%, akinesis of mid to apical septal LV segments, all apical segments and apex.   Will repeat CT head today   CBC:  Recent Labs  Lab 01/08/22 1326 01/08/22 1345 01/09/22 0503 01/10/22 0522 01/10/22 1509 01/11/22 0532  WBC 10.0   < > 26.5*   < > 21.5* 17.3*  NEUTROABS 7.3  --  26.0*  --   --   --   HGB 7.6*   < > 9.0*   < > 8.4* 8.2*  HCT 23.9*   < > 25.8*   < > 25.0* 24.2*  MCV 91.9   < > 89.0   < > 91.6 89.6  PLT 399   < > 327   < > 293 317   < > = values in this interval not displayed.    Basic Metabolic Panel:  Recent Labs  Lab 01/10/22 1757 01/11/22 0532 01/11/22 1005  NA 141 141  --   K 3.9 3.5  --   CL 108 109  --   CO2 19* 20*  --   GLUCOSE 175* 82  --   BUN 42* 42*  --   CREATININE 4.54* 4.16*  --   CALCIUM 7.6* 8.1*  --   MG  --  1.9 1.9  PHOS  --  5.7* 6.0*    Lipid Panel:  Recent Labs  Lab 01/09/22 0503  CHOL 85  TRIG 154*  156*  HDL 15*  CHOLHDL 5.7  VLDL 31  LDLCALC 39    HgbA1c:  Recent Labs  Lab 01/09/22 1425  HGBA1C 8.0*    Urine Drug Screen:  Recent Labs  Lab 01/09/22 0225  LABOPIA NONE DETECTED  COCAINSCRNUR NONE DETECTED  LABBENZ NONE DETECTED  AMPHETMU NONE DETECTED  THCU NONE DETECTED  LABBARB NONE DETECTED     Alcohol Level  Recent Labs  Lab 01/08/22 1339  ETH <10     IMAGING past 24 hours DG CHEST PORT 1 VIEW  Result Date: 01/11/2022 CLINICAL DATA:  Acute respiratory distress EXAM: PORTABLE CHEST 1 VIEW COMPARISON:  01/09/2022  FINDINGS: No significant change in AP portable chest radiograph. Cardiomegaly with mild, diffuse bilateral interstitial opacity and probable small, layering pleural effusions. No new or focal airspace opacity. Endotracheal tube tip position is generally obscured by overlying esophagogastric tube but appears appropriate over the midtrachea. Esophagogastric tube with tip and side port below the diaphragm. IMPRESSION: 1. No significant change in AP portable chest radiograph. Cardiomegaly with mild, diffuse bilateral interstitial opacity and probable small, layering pleural effusions most likely edema. No new or focal airspace opacity. 2. Endotracheal tube tip position is generally obscured by overlying esophagogastric tube but appears appropriate over the midtrachea. Electronically Signed   By: Delanna Ahmadi M.D.   On: 01/11/2022 11:19   DG Abd Portable 1V  Result Date: 01/10/2022 CLINICAL DATA:  NG tube placement EXAM: PORTABLE ABDOMEN - 1 VIEW COMPARISON:  None Available. FINDINGS: Enteric tube terminates in the gastric antrum. Nonobstructive bowel gas pattern. Cholecystectomy clips. IMPRESSION: Enteric  tube terminates in the gastric antrum. Electronically Signed   By: Julian Hy M.D.   On: 01/10/2022 19:06   US RENAL  Result Date: 01/10/2022 CLINICAL DATA:  Renal dysfunction EXAM: RENAL / URINARY TRACT ULTRASOUND COMPLETE COMPARISON:  01/01/2020 FINDINGS: Right Kidney: Renal measurements: 9.2 x 4.8 x 6.1 cm = volume: 141.7 mL. There is no hydronephrosis. There is increased cortical echogenicity. Left Kidney: Renal measurements: Lennie Odor by 6.2 x 4.8 cm = volume: 171.8 mL. There is no hydronephrosis. There is increased cortical echogenicity. Bladder: Foley catheter is seen in the bladder. Urinary bladder is not distended and not adequately evaluated. Other: None. IMPRESSION: There is no hydronephrosis. Increased cortical echogenicity in the kidneys suggests medical renal disease. Electronically Signed    By: Elmer Picker M.D.   On: 01/10/2022 15:17    PHYSICAL EXAM  Temp:  [97.7 F (36.5 C)-99.2 F (37.3 C)] 97.7 F (36.5 C) (09/11 0800) Pulse Rate:  [102-119] 107 (09/11 1200) Resp:  [23-36] 30 (09/11 1200) BP: (122-165)/(54-100) 165/89 (09/11 1200) SpO2:  [93 %-100 %] 93 % (09/11 1200) Arterial Line BP: (19-164)/(0-78) 19/0 (09/10 1600) Weight:  [95.4 kg] 95.4 kg (09/11 0500)  General - Critically ill female. Well nourished, well developed, in no apparent distress. Cardiovascular - Regular rhythm and rate.  Mental Status -  Not speaking, intermittently Follows commands on the right. Mimicking examiner. Right gaze preference, does not cross midline, left facial droop, tongue midline. PERRL, very sluggish bilaterally, No blink to threat on left.  Motor Strength - Left arm is flaccid. Left leg withdraws to painful stimuli. Grimaces to noxious stimuli on left. Right side moves spontaneously and purposeful. Motor Tone - Muscle tone was assessed at the neck and appendages and was normal.   Sensory - appears to be intact to noxious stimuli  Coordination - Unable to assess  Gait and Station - deferred.   ASSESSMENT/PLAN Lauren [name] is a 41 y.o. 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 NIHSS of 8. Patient not a TPA candidate, underwent IR thrombectomy of R ICA  with R ICA dissection repair and stent placement with TICI 3  Stroke:  Acute Right MCA and vertebral stroke s/p thrombectomy with TICI 3 with right ICA dissection repair and stent placement  Etiology:   unclear, likely cardioembolic  Code Stroke CT head 1. Periventricular white matter hypodensities adjacent to the frontal horn of the right lateral ventricles and in the right corona radiata are new since prior exam. This may represent acute ischemia. 2. No acute or focal cortical abnormality. 3. ASPECTS is 10/10 CTA head & neck with CT perfusion  1. 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 Right MCA and A1 ACA are patent, but diminutive. 2. Small/non dominant right vertebral artery, which is irregular throughout its V2 segment with age indeterminate occlusion at the V3 segment with non opacification of the proximal intradural vertebral artery and reconstitution of the distal intradural vertebral artery. 3. Severe left paraclinoid ICA stenosis. 4. One CT perfusion, no evidence of core  infarct or penumbra; however, there is approximately 38 mL of T-max greater than 4 seconds in the right MCA territory which suggests possible oligemia. An MRI could provide more sensitive evaluation for acute infarct. 5. Moderate layering bilateral pleural effusions and peribronchial wall thickening. Recommend dedicated chest imaging  Cerebral angio  Occluded right internal carotid artery extracranially and intracranially to the supraclinoid region Status post endovascular complete revascularization of occluded right internal carotid artery proximally and distally intracranially with 1 pass with a 4 mm x 40 mm solitary extragenital device and proximal aspiration, 1 pass with a 5 mm x 37 mm amber trap retrieval device and aspiration proximally, and 1 pass with a 6.5 mm x 45 mm ambulatory with proximal aspiration achieving a complete revascularization of the internal carotid artery.   Status post reconstruction of symptomatic occlusive long segment dissection extending from the proximal one third of the right internal  carotid artery to the petrous cavernous junction using 3 pipeline flow diverter's and a 4 mm x 24 mm Neuroform Atlas stent in a  telescope fashion.  Final arteriogram from the right common carotid artery demonstrates complete revascularization without any filling defects with opacification of the right middle cerebral artery distribution maintaining aTICI 3 revascularization.  Post IR CT  1. Redemonstrated hypodensity in the right frontal periventricular white matter. No additional acute intracranial process. No additional acute infarct or hemorrhage. 2. Air-fluid levels with bubbly fluid in the maxillary sinuses and right sphenoid sinus are likely related to intubation but can be seen in the setting of sinusitis  MRI   1. Extensive patchy areas of acute nonhemorrhagic infarction involving the right MCA territory, most confluent areas along the watershed. 2. Focal nonhemorrhagic infarct involving the genu of the corpus callosum and more posteriorly along the body of the corpus callosum on the right. 3. Diffuse sinus disease is likely secondary to intubation MRA   1. Abnormal flow signal within the petrous and pre cavernous right ICA may represent artifact from stent or slow flow 2. Signal loss in the supraclinoid left ICA may be artifactual. 3. Asymmetric diffused decreased size of right MCA branch vessels compared to the left likely reflects decreased perfusion. No significant proximal stenosis or occlusion  2D Echo EF 30-35%, akinesis of mid to apical septal LV segments, all apical segments and apex.  LDL 39 HgbA1c 9.6 VTE prophylaxis - SCD's    Diet   Diet NPO time specified   Eliquis (apixaban) daily prior to admission, now on aspirin 81 mg daily and Brilinta (ticagrelor) 90 mg bid.  Therapy recommendations:  pending Disposition:  pending  Hypertension Home meds:  hydralazine, metoprolol, isosorbide Stable Long-term BP goal normotensive  Hyperlipidemia Home meds:   fenofibrate, not resumed in hospital LDL 39, goal < 70 Started on atorvastatin. Continue at discharge   Diabetes type II UnControlled Metabolic Acidosis  DKA Home meds:  insulin HgbA1c 9.6, goal < 7.0 CBGs Recent Labs    01/11/22 0339 01/11/22 0753 01/11/22 1150  GLUCAP 88 105* 149*     insulin gtt currently off Correct electrolyte derangements SSI Needs close follow up with PCP to help manage DM   Other Stroke Risk Factors  Former Cigarette smoker advised to stop smoking Obesity, Body mass index is 35 kg/m., BMI >/= 30 associated with increased stroke risk, recommend weight loss, diet and exercise as appropriate  Hx stroke/TIA Coronary artery disease Congestive heart failure  Other Active Problems Acute hypoxic respiratory Failure, resolved  managed by CCM Off prop Extubated  9/10  Chronic Combined HF Echo pending  Meds on hold currently  Hypothyroidism  Home synthroid   AKI on CKD  IVF  Worsening Cr 2.50-3.07-3.63-3.80-4.17-4.54-4.16  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 On  aspirin and Brilinta.  Awake today. She is not able to speak.  She is able to follow commands.  Dense left hemiplegia.  Unclear why she is unable to speak.  We will get a head CT to ensure there is no worsening that account for it.   trial of morphine did not make a difference.  She continues to have chest pain and shortness of breath.  CCM getting cardiology involved for elevated troponins. Appreciate CCM assistance.     Discussed with Dr. Estanislado Pandy.  ADDENDUM: CT head stable.   Dr.  Reeves Forth evaluated pt independently, reviewed imaging, chart, labs. Discussed and formulated plan with the APP. Please see APP note above for details.     This patient is critically ill due to respiratory distress, possible sepsis/osteo, and  stroke s/p thrombectomy and at significant risk of neurological worsening, death form heart failure, respiratory failure, recurrent stroke, bleeding from [facility], seizure, sepsis. This patient's care requires constant monitoring of vital signs, hemodynamics, respiratory and cardiac monitoring, review of multiple databases, neurological assessment, discussion with family, other specialists and medical decision making of high complexity. I spent 35 minutes of neurocritical care time in the care of this patient.    Chrystine Frogge,MD

## 2022-01-11 NOTE — Progress Notes (Signed)
  Transition of Care [facility]) Screening Note   Patient Details  Name: Lauren [name] Date of Birth: Oct 16, 1980   Transition of Care [facility]) CM/SW Contact:    Benard Halsted, LCSW Phone Number: 01/11/2022, 10:03 AM    Transition of Care Department [facility]) has reviewed patient. We will continue to monitor patient advancement through interdisciplinary progression rounds. If new patient transition needs arise, please place a TOC consult.

## 2022-01-11 NOTE — Patient Outreach (Addendum)
  Care Coordination   Initial Visit Note   01/11/2022 Name: Lauren [name]  MRN: [id number] DOB: 1980-05-17  [name] a 41 year old female was noted to be hospitalized at the time of the 01/11/22 1500 scheduled [facility] RN CM outreach.  Encounter Outcome:  Hospitalized Further outreach will occur after discharge Message sent to [facility] hospital liaison related to patient ongoing financial concerns    Daralyn Bert L. Lavina Hamman, RN, BSN, Effort Coordinator Office number [phone]

## 2022-01-11 NOTE — Progress Notes (Signed)
Speech Language Pathology Treatment: Dysphagia  Patient Details Name: Lauren [name] MRN: [id number] DOB: 1980/07/23 Today's Date: 01/11/2022 Time: 2025-4270 SLP Time Calculation (min) (ACUTE ONLY): 10 min  Assessment / Plan / Recommendation Clinical Impression  Pt was seen for dysphagia treatment with her husband present at the onset of the session. Pt was lethargic during the session. Pt's bolus awareness continues to be impaired with absent bolus manipulation despite prompts and cues. Boluses were ultimately removed from the oral cavity via suction. Pt does not present as a candidate for safe oral intake at this time and it is therefore recommended that her NPO status be maintained. SLP will continue to follow pt.    HPI HPI: Dayra Buchta is a 41 y.o. female who presented to the ED for acute onset of chest pain with left side weakness. CT head right MCA and vertebral stroke; underwent IR thrombectomy of R ICA with R ICA dissection repair and stent placement with TICI 3. Intubated 9/8-10.  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      SLP Plan  Continue with current plan of care      Recommendations for follow up therapy are one component of a multi-disciplinary discharge planning process, led by the attending physician.  Recommendations may be updated based on patient status, additional functional criteria and insurance authorization.    Recommendations  Diet recommendations: NPO Medication Administration: Via alternative means                Oral Care Recommendations: Oral care QID Follow Up Recommendations: Other (comment) (tba) Assistance recommended at discharge: Frequent or constant  Supervision/Assistance SLP Visit Diagnosis: Dysphagia, oropharyngeal phase (R13.12) Plan: Continue with current plan of care         Gilad Dugger I. Hardin Negus, [place], [place] Office number [phone]   Horton Marshall  01/11/2022, 11:23 AM

## 2022-01-11 NOTE — Evaluation (Signed)
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 She lives in a 1 level home, 5 STE with 24/7 support available. Upon evaluation pt has functional limitations due to L hemiplegia, R gaze deviation, impaired communication, impaired cognition, poor balance, and decreased activity tolerance. Overall she required max A +2 for bed mobility and 2x sit<>stand transfers. Once sitting she was min-max A for balance with L lateral lean. Due to deficits listed below she requires max A for UB ADLs and total A for LB ADLs at bed level. Pt benefits from simple one step commands to actively participate. OT to continue to follow acutely. Recommend AIR fro maximal functional progress and to reduce caregiver burden once d/c'dhome.    Recommendations for follow up therapy are one component of a multi-disciplinary discharge planning process, led by the attending physician.  Recommendations may be updated based on patient status, additional functional criteria and insurance authorization.   Follow Up Recommendations  Acute inpatient rehab (3hours/day)    Assistance Recommended at Discharge Frequent or constant Supervision/Assistance  Patient can return home with the following A lot of help with walking and/or transfers;A lot of help with bathing/dressing/bathroom;Two people to help with walking and/or  transfers;Two people to help with bathing/dressing/bathroom;Assistance with cooking/housework;Assistance with feeding;Direct supervision/assist for medications management;Direct supervision/assist for financial management;Assist for transportation;Help with stairs or ramp for entrance    Functional Status Assessment  Patient has had a recent decline in their functional status and demonstrates the ability to make significant improvements in function in a reasonable and predictable amount of time.  Equipment Recommendations  Other (comment) (pending progression)    Recommendations for Other Services Rehab consult     Precautions / Restrictions Precautions Precautions: Fall Precaution Comments: L inattention, R gaze preference, L hemiplegia Restrictions Weight Bearing Restrictions:  (no orders placed for LLE, toe amp)      Mobility Bed Mobility Overal bed mobility: Needs Assistance Bed Mobility: Supine to Sit, Sit to Supine, Rolling Rolling: Mod assist, Max assist   Supine to sit: Max assist, +2 for physical assistance Sit to supine: Max assist, +2 for physical assistance   General bed mobility comments: using R side to assist to scoot and lift trunk, though leans to L and pushes into support some; to supine assist for L LE and to reposition hips, assist to roll for hygiene due to bed soiled mod A to L and max A to R    Transfers Overall transfer level: Needs assistance Equipment used: 2 person hand held assist Transfers: Sit to/from Stand Sit to Stand: Max assist, +2 physical assistance           General transfer comment: some support under L LE with knee blocked and foot placed, initially unsupported with foot posterior to COG; leans L      Balance Overall balance assessment: Needs assistance Sitting-balance support: Feet supported Sitting balance-Leahy Scale: Poor Sitting balance - Comments:  assist for L lateral lean   Standing balance support: Bilateral upper  extremity supported Standing balance-Leahy Scale: Zero Standing balance comment: max A +2 to stand                           ADL either performed or assessed with clinical judgement   ADL Overall ADL's : Needs assistance/impaired Eating/Feeding: NPO   Grooming: Moderate assistance;Sitting   Upper Body Bathing: Maximal assistance;Sitting   Lower Body Bathing: Total assistance   Upper Body Dressing : Maximal assistance;Sitting   Lower Body Dressing: Total assistance   Toilet Transfer: Total assistance   Toileting- Clothing Manipulation and Hygiene: Total assistance       Functional mobility during ADLs: Maximal assistance;+2 for physical assistance;+2 for safety/equipment General ADL Comments: limited by L hemiplegia, R gaze, L cut vs. inattention. max A +2 for bed mobility and sit<>stand x2. Unable to pivot.     Vision Baseline Vision/History: 1 Wears glasses Vision Assessment?: Vision impaired- to be further tested in functional context;Yes Eye Alignment: Within Functional Limits Ocular Range of Motion: Impaired-to be further tested in functional context Additional Comments: maintained R gaze, able to come to midline with extensive cues. would turn head to the L, gaze stayed to the R. Needs further assessment.     Perception     Praxis      Pertinent Vitals/Pain Pain Assessment Pain Assessment: Faces Faces Pain Scale: Hurts a little bit Pain Location: tapping her chest (undergoing EKG and troponin draw) Pain Descriptors / Indicators: Guarding, Grimacing Pain Intervention(s): Limited activity within patient's tolerance, Monitored during session     Hand Dominance Right   Extremity/Trunk Assessment Upper Extremity Assessment Upper Extremity Assessment: RUE deficits/detail;LUE deficits/detail RUE Deficits / Details: trace activation noted distally. Did not move purposefully or spontaneously. incrased edema noted RUE Sensation: decreased light  touch;decreased proprioception RUE Coordination: decreased gross motor;decreased fine motor LUE Deficits / Details: Moving through full ROM, strength is seemingly WFL. Difficul tto fully assess due to impaired cognition and communication LUE Sensation: WNL LUE Coordination: decreased fine motor;decreased gross motor   Lower Extremity Assessment Lower Extremity Assessment: Defer to PT evaluation LLE Deficits / Details: not moving actively, some weight support with knee blocked during brief standing trial; keeping leg externally rotated and flexed in bed, has toes wrapped with kerlix LLE Sensation: history of peripheral neuropathy LLE Coordination: decreased gross motor   Cervical / Trunk Assessment Cervical / Trunk Assessment: Normal   Communication Communication Communication: Expressive difficulties   Cognition Arousal/Alertness: Awake/alert Behavior During Therapy: Restless Overall Cognitive Status: Difficult to assess Area of Impairment: Attention, Following commands, Safety/judgement                   Current Attention Level: Focused   Following Commands: Follows one step commands inconsistently, Follows one step commands with increased time Safety/Judgement: Decreased awareness of safety, Decreased awareness of deficits     General Comments: attemptin to pull out NG when out of mitt. Follows 50% simple cues to assist with bed mobility. Shaking head yes/no to simple direct questions with increased time and cues     General Comments  pt pointing to chest several times throughout session, MD and RN aware.    Exercises     Shoulder Instructions      Home Living Family/patient expects to be discharged to:: Private residence Living Arrangements: Spouse/significant other Available Help at Discharge: Family;Available 24 hours/day Type of Home: Mobile home  Home Access: Stairs to enter Entrance Stairs-Number of Steps: 5 Entrance Stairs-Rails: Right;Left Home Layout:  One level     Bathroom Shower/Tub: Occupational psychologist: Standard Bathroom Accessibility: Yes   Home Equipment: None   Additional Comments: post op shoe for L foot s/p toe amputation  Lives With: Spouse    Prior Functioning/Environment Prior Level of Function : Independent/Modified Independent;Working/employed             Mobility Comments: no AD ADLs Comments: works as Psychologist, forensic Problem List: Decreased strength;Decreased range of motion;Decreased activity tolerance;Impaired balance (sitting and/or standing);Decreased coordination;Decreased cognition;Decreased safety awareness;Decreased knowledge of precautions;Decreased knowledge of use of DME or AE;Cardiopulmonary status limiting activity;Impaired UE functional use;Increased edema;Impaired sensation      OT Treatment/Interventions: Self-care/ADL training;Therapeutic exercise;DME and/or AE instruction;Therapeutic activities;Patient/family education;Balance training;Neuromuscular education    OT Goals(Current goals can be found in the care plan section) Acute Rehab OT Goals Patient Stated Goal: did not state OT Goal Formulation: With patient Time For Goal Achievement: 01/25/22 Potential to Achieve Goals: Fair ADL Goals Pt Will Perform Grooming: with set-up;with min guard assist;sitting Pt Will Perform Upper Body Dressing: with min assist;sitting Pt Will Transfer to Toilet: with mod assist;with +2 assist;stand pivot transfer;bedside commode Additional ADL Goal #1: pt will follow 100% of simple 1 step commands with minimal cues to participate in functional tasks Additional ADL Goal #2: Pt will locate at least 3 grooming items in her L environment twith moderate cues to initiate grooming task  OT Frequency: Min 2X/week    Co-evaluation PT/OT/SLP Co-Evaluation/Treatment: Yes Reason for Co-Treatment: Complexity of the patient's impairments (multi-system involvement);To address functional/ADL  transfers;For patient/therapist safety PT goals addressed during session: Balance;Mobility/safety with mobility OT goals addressed during session: ADL's and self-care      AM-PAC OT "6 Clicks" Daily Activity     Outcome Measure Help from another person eating meals?: Total Help from another person taking care of personal grooming?: A Lot Help from another person toileting, which includes using toliet, bedpan, or urinal?: Total Help from another person bathing (including washing, rinsing, drying)?: A Lot Help from another person to put on and taking off regular upper body clothing?: A Lot Help from another person to put on and taking off regular lower body clothing?: Total 6 Click Score: 9   End of Session Equipment Utilized During Treatment: Oxygen Nurse Communication: Mobility status  Activity Tolerance: Patient tolerated treatment well Patient left: in bed;with call bell/phone within reach;with bed alarm set;with nursing/sitter in room  OT Visit Diagnosis: Unsteadiness on feet (R26.81);Other abnormalities of gait and mobility (R26.89);Muscle weakness (generalized) (M62.81);Hemiplegia and hemiparesis Hemiplegia - Right/Left: Left Hemiplegia - dominant/non-dominant: Dominant Hemiplegia - caused by: Cerebral infarction                Time: 0932-1006 OT Time Calculation (min): 34 min Charges:  OT General Charges $OT Visit: 1 Visit OT Evaluation $OT Eval Moderate Complexity: 1 Mod    Chandani Rogowski D Causey 01/11/2022, 11:42 AM

## 2022-01-11 NOTE — Evaluation (Signed)
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  Clinical Impression  Patient presents with decreased mobility due to L side weakness, decreased balance with poor visual-perceptual awareness of midline, decreased visual scanning to L side, poor L side awareness and decreased activity tolerance with RR in 30's on O2 and receiving breathing treatment with  HR 107.  She will benefit from skilled PT in the acute setting and from follow up acute inpatient rehab prior to d/c home with caregiver/family support.       Recommendations for follow up therapy are one component of a multi-disciplinary discharge planning process, led by the attending physician.  Recommendations may be updated based on patient status, additional functional criteria and insurance authorization.  Follow Up Recommendations Acute inpatient rehab (3hours/day)      Assistance Recommended at Discharge Frequent or constant Supervision/Assistance  Patient can return home with the following  Assist for transportation;Assistance with cooking/housework;Two people to help with walking and/or transfers;A lot of help with bathing/dressing/bathroom;Help with stairs or ramp for entrance    Equipment Recommendations Other (comment) (TBD at next venue)  Recommendations for Other Services  Rehab consult    Functional Status Assessment Patient has had a recent decline in their functional status and demonstrates the ability to make significant improvements in function in a reasonable  and predictable amount of time.     Precautions / Restrictions Precautions Precautions: Fall Precaution Comments: L inattention, R gaze preference, L hemiplegia Restrictions Weight Bearing Restrictions:  (no orders placed for LLE, toe amp)      Mobility  Bed Mobility Overal bed mobility: Needs Assistance Bed Mobility: Supine to Sit, Sit to Supine, Rolling Rolling: Mod assist, Max assist   Supine to sit: Max assist, +2 for physical assistance Sit to supine: Max assist, +2 for physical assistance   General bed mobility comments: using R side to assist to scoot and lift trunk, though leans to L and pushes into support some; to supine assist for L LE and to reposition hips, assist to roll for hygiene due to bed soiled mod A to L and max A to R    Transfers Overall transfer level: Needs assistance Equipment used: 2 person hand held assist Transfers: Sit to/from Stand Sit to Stand: Max assist, +2 physical assistance           General transfer comment: some support under L LE with knee blocked and foot placed, initially unsupported with foot posterior to COG; leans L    Ambulation/Gait               General Gait Details: unable  Stairs            Wheelchair Mobility    Modified Rankin (Stroke Patients Only) Modified Rankin (Stroke Patients Only) Pre-Morbid Rankin Score: No symptoms Modified Rankin: Severe disability     Balance Overall balance assessment: Needs assistance Sitting-balance support: Feet supported Sitting balance-Leahy Scale: Poor Sitting balance - Comments: min to mod assist for L lateral lean (pushing with more heavy support)   Standing balance support: Bilateral upper extremity supported  Standing balance-Leahy Scale: Zero Standing balance comment: max A +2 to stand                             Pertinent Vitals/Pain Pain Assessment Pain Assessment: Faces Faces Pain Scale: Hurts even more Pain Location: tapping her chest  (undergoing EKG and troponin draw) Pain Descriptors / Indicators: Guarding, Grimacing Pain Intervention(s): Monitored during session, Repositioned, Limited activity within patient's tolerance    Home Living Family/patient expects to be discharged to:: Private residence Living Arrangements: Spouse/significant other Available Help at Discharge: Family;Available 24 hours/day Type of Home: Mobile home Home Access: Stairs to enter Entrance Stairs-Rails: Psychiatric nurse of Steps: 5   Home Layout: One level Home Equipment: None Additional Comments: post op shoe for L foot s/p toe amputation    Prior Function Prior Level of Function : Independent/Modified Independent;Working/employed             Mobility Comments: no AD 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 Did not move purposefully or spontaneously. incrased edema noted RUE Sensation: decreased light touch;decreased proprioception RUE Coordination: decreased gross motor;decreased fine motor LUE Deficits / Details: Moving through full ROM, strength is seemingly WFL. Difficul tto fully assess due to impaired cognition and communication LUE Sensation: WNL LUE Coordination: decreased fine motor;decreased gross motor    Lower Extremity Assessment Lower Extremity Assessment: Defer to PT evaluation LLE Deficits / Details: not moving actively, some weight support with knee blocked during brief standing trial; keeping leg externally rotated and flexed in bed, has toes wrapped with kerlix LLE Sensation: history of peripheral neuropathy LLE Coordination: decreased gross motor    Cervical / Trunk Assessment Cervical / Trunk Assessment: Normal  Communication   Communication: Expressive  difficulties  Cognition Arousal/Alertness: Awake/alert Behavior During Therapy: Restless Overall Cognitive Status: Difficult to assess Area of Impairment: Attention, Following commands, Safety/judgement                   Current Attention Level: Focused   Following Commands: Follows one step commands inconsistently, Follows one step commands with increased time Safety/Judgement: Decreased awareness of safety, Decreased awareness of deficits     General Comments: attemptin to pull out NG when out of mitt. Follows 50% simple cues to assist with bed mobility. Shaking head yes/no to simple direct questions with increased time and cues        General Comments General comments (skin integrity, edema, etc.): pt pointing to chest several times throughout session, MD and RN aware.    Exercises     Assessment/Plan    PT Assessment Patient needs continued PT services  PT Problem List Decreased strength;Decreased cognition;Decreased activity tolerance;Decreased mobility;Impaired sensation;Decreased balance;Decreased safety awareness       PT Treatment Interventions DME instruction;Balance training;Gait training;Therapeutic activities;Therapeutic exercise;Functional mobility training;Patient/family education;Neuromuscular re-education    PT Goals (Current goals can be found in the Care Plan section)  Acute Rehab PT Goals Patient Stated Goal: to return home PT Goal Formulation: With family Time For Goal Achievement: 01/25/22 Potential to Achieve Goals: Good    Frequency Min 4X/week     Co-evaluation PT/OT/SLP Co-Evaluation/Treatment: Yes Reason for Co-Treatment: Complexity of the patient's impairments (multi-system involvement);For patient/therapist safety;To address functional/ADL transfers PT goals addressed during session: Mobility/safety with  mobility;Balance OT goals addressed during session: ADL's and self-care       AM-PAC PT "6 Clicks" Mobility  Outcome Measure  Help needed turning from your back to your side while in a flat bed without using bedrails?: A Lot Help needed moving from lying on your back to sitting on the side of a flat bed without using bedrails?: Total Help needed moving to and from a bed to a chair (including a wheelchair)?: Total Help needed standing up from a chair using your arms (e.g., wheelchair or bedside chair)?: Total Help needed to walk in hospital room?: Total Help needed climbing 3-5 steps with a railing? : Total 6 Click Score: 7    End of Session Equipment Utilized During Treatment: Gait belt;Oxygen Activity Tolerance: Patient limited by fatigue;Other (comment) (unable to progress OOB due to [facility]) Patient left: in bed;with restraints reapplied;with call bell/phone within reach;with bed alarm set;Other (comment) (RT in the room)   PT Visit Diagnosis: Muscle weakness (generalized) (M62.81);Hemiplegia and hemiparesis;Other abnormalities of gait and mobility (R26.89) Hemiplegia - Right/Left: Left Hemiplegia - dominant/non-dominant: Non-dominant Hemiplegia - caused by: Cerebral infarction    Time: 0932-1006 PT Time Calculation (min) (ACUTE ONLY): 34 min   Charges:   PT Evaluation $PT Eval Moderate Complexity: 1 Mod          Cyndi Jlee Harkless, PT Acute Rehabilitation Services Office:[phone] 01/11/2022   Reginia Naas 01/11/2022, 12:15 PM

## 2022-01-12 ENCOUNTER — Inpatient Hospital Stay (HOSPITAL_COMMUNITY): Payer: No Typology Code available for payment source

## 2022-01-12 ENCOUNTER — Ambulatory Visit: Payer: No Typology Code available for payment source | Admitting: Adult Health

## 2022-01-12 DIAGNOSIS — I6521 Occlusion and stenosis of right carotid artery: Secondary | ICD-10-CM | POA: Diagnosis not present

## 2022-01-12 DIAGNOSIS — I5020 Unspecified systolic (congestive) heart failure: Secondary | ICD-10-CM

## 2022-01-12 DIAGNOSIS — J9601 Acute respiratory failure with hypoxia: Secondary | ICD-10-CM

## 2022-01-12 LAB — BASIC METABOLIC PANEL
Anion gap: 11 (ref 5–15)
BUN: 55 mg/dL — ABNORMAL HIGH (ref 6–20)
CO2: 22 mmol/L (ref 22–32)
Calcium: 8.2 mg/dL — ABNORMAL LOW (ref 8.9–10.3)
Chloride: 109 mmol/L (ref 98–111)
Creatinine, Ser: 3.58 mg/dL — ABNORMAL HIGH (ref 0.44–1.00)
GFR, Estimated: 16 mL/min — ABNORMAL LOW (ref >60)
Glucose, Bld: 272 mg/dL — ABNORMAL HIGH (ref 70–99)
Potassium: 3.7 mmol/L (ref 3.5–5.1)
Sodium: 142 mmol/L (ref 135–145)

## 2022-01-12 LAB — TYPE AND SCREEN
ABO/RH(D): A POS
Antibody Screen: POSITIVE
Donor AG Type: NEGATIVE
Donor AG Type: NEGATIVE
Donor AG Type: NEGATIVE
Donor AG Type: NEGATIVE
Unit division: 0
Unit division: 0
Unit division: 0
Unit division: 0

## 2022-01-12 LAB — BPAM RBC
Blood Product Expiration Date: 202310052359
Blood Product Expiration Date: 202310052359
Blood Product Expiration Date: 202310072359
Blood Product Expiration Date: 202310072359
ISSUE DATE / TIME: 202309082235
ISSUE DATE / TIME: 202309090036
ISSUE DATE / TIME: 202309101056
Unit Type and Rh: 6200
Unit Type and Rh: 6200
Unit Type and Rh: 6200
Unit Type and Rh: 6200

## 2022-01-12 LAB — GLUCOSE, CAPILLARY
Glucose-Capillary: 238 mg/dL — ABNORMAL HIGH (ref 70–99)
Glucose-Capillary: 220 mg/dL — ABNORMAL HIGH (ref 70–99)
Glucose-Capillary: 203 mg/dL — ABNORMAL HIGH (ref 70–99)
Glucose-Capillary: 227 mg/dL — ABNORMAL HIGH (ref 70–99)
Glucose-Capillary: 225 mg/dL — ABNORMAL HIGH (ref 70–99)

## 2022-01-12 LAB — CBC
HCT: 24.5 % — ABNORMAL LOW (ref 36.0–46.0)
Hemoglobin: 8.4 g/dL — ABNORMAL LOW (ref 12.0–15.0)
MCH: 31.1 pg (ref 26.0–34.0)
MCHC: 34.3 g/dL (ref 30.0–36.0)
MCV: 90.7 fL (ref 80.0–100.0)
Platelets: 348 10*3/uL (ref 150–400)
RBC: 2.7 MIL/uL — ABNORMAL LOW (ref 3.87–5.11)
RDW: 14.1 % (ref 11.5–15.5)
WBC: 19.1 10*3/uL — ABNORMAL HIGH (ref 4.0–10.5)
nRBC: 0 % (ref 0.0–0.2)

## 2022-01-12 LAB — TROPONIN I (HIGH SENSITIVITY)
Troponin I (High Sensitivity): 3807 ng/L (ref <18)
Troponin I (High Sensitivity): 3512 ng/L (ref <18)

## 2022-01-12 LAB — PHOSPHORUS
Phosphorus: 4.9 mg/dL — ABNORMAL HIGH (ref 2.5–4.6)
Phosphorus: 4.2 mg/dL (ref 2.5–4.6)

## 2022-01-12 LAB — APTT
aPTT: 45 seconds — ABNORMAL HIGH (ref 24–36)
aPTT: 43 seconds — ABNORMAL HIGH (ref 24–36)
aPTT: 52 seconds — ABNORMAL HIGH (ref 24–36)

## 2022-01-12 LAB — MAGNESIUM
Magnesium: 2.2 mg/dL (ref 1.7–2.4)
Magnesium: 2.2 mg/dL (ref 1.7–2.4)

## 2022-01-12 LAB — HEPARIN LEVEL (UNFRACTIONATED): Heparin Unfractionated: 0.88 IU/mL — ABNORMAL HIGH (ref 0.30–0.70)

## 2022-01-12 MED ORDER — GLUCERNA 1.5 CAL PO LIQD
1000.0000 mL | ORAL | Status: DC
  Administered 2022-01-12 – 2022-01-25 (×12): 1000 mL
  Filled 2022-01-12 (×20): qty 1000

## 2022-01-12 MED ORDER — INSULIN ASPART 100 UNIT/ML IJ SOLN
0.0000 [IU] | INTRAMUSCULAR | Status: DC
  Administered 2022-01-12 – 2022-01-13 (×5): 7 [IU] via SUBCUTANEOUS
  Administered 2022-01-13: 3 [IU] via SUBCUTANEOUS
  Administered 2022-01-13 (×3): 7 [IU] via SUBCUTANEOUS
  Administered 2022-01-14: 20 [IU] via SUBCUTANEOUS
  Administered 2022-01-14: 4 [IU] via SUBCUTANEOUS
  Administered 2022-01-14: 3 [IU] via SUBCUTANEOUS
  Administered 2022-01-14: 4 [IU] via SUBCUTANEOUS

## 2022-01-12 MED ORDER — PANTOPRAZOLE 2 MG/ML SUSPENSION
40.0000 mg | Freq: Every day | ORAL | Status: DC
  Administered 2022-01-12 – 2022-01-27 (×14): 40 mg
  Filled 2022-01-12 (×15): qty 20

## 2022-01-12 MED ORDER — METOPROLOL TARTRATE 25 MG PO TABS
12.5000 mg | ORAL_TABLET | Freq: Two times a day (BID) | ORAL | Status: DC
  Administered 2022-01-12 – 2022-01-13 (×3): 12.5 mg
  Filled 2022-01-12 (×3): qty 1

## 2022-01-12 MED ORDER — FENTANYL CITRATE PF 50 MCG/ML IJ SOSY
25.0000 ug | PREFILLED_SYRINGE | Freq: Once | INTRAMUSCULAR | Status: AC
  Administered 2022-01-12: 25 ug via INTRAVENOUS
  Filled 2022-01-12: qty 1

## 2022-01-12 MED ORDER — FUROSEMIDE 10 MG/ML IJ SOLN
60.0000 mg | Freq: Once | INTRAMUSCULAR | Status: AC
  Administered 2022-01-12: 60 mg via INTRAVENOUS
  Filled 2022-01-12: qty 6

## 2022-01-12 MED ORDER — ISOSORBIDE MONONITRATE 10 MG PO TABS
15.0000 mg | ORAL_TABLET | Freq: Two times a day (BID) | ORAL | Status: DC
  Filled 2022-01-12: qty 1.5

## 2022-01-12 MED ORDER — POTASSIUM CHLORIDE 20 MEQ PO PACK
20.0000 meq | PACK | Freq: Once | ORAL | Status: AC
  Administered 2022-01-12: 20 meq
  Filled 2022-01-12: qty 1

## 2022-01-12 MED ORDER — NITROGLYCERIN IN D5W 200-5 MCG/ML-% IV SOLN
0.0000 ug/min | INTRAVENOUS | Status: DC
  Administered 2022-01-12: 5 ug/min via INTRAVENOUS
  Administered 2022-01-13 – 2022-01-14 (×2): 90 ug/min via INTRAVENOUS
  Administered 2022-01-15: 55 ug/min via INTRAVENOUS
  Filled 2022-01-12 (×6): qty 250

## 2022-01-12 MED ORDER — ISOSORBIDE MONONITRATE 20 MG PO TABS
20.0000 mg | ORAL_TABLET | Freq: Two times a day (BID) | ORAL | Status: DC
  Administered 2022-01-12 – 2022-01-27 (×31): 20 mg
  Filled 2022-01-12 (×34): qty 1

## 2022-01-12 NOTE — Progress Notes (Signed)
[facility] for heparin Indication: chest pain/ACS  Allergies  Allergen Reactions   Wellbutrin [Bupropion] Hives   Cefepime Rash    Tolerates penicilllin   Ciprofloxacin Hcl Hives and Rash    Hives/rash at injection site    Tape Rash    Patient Measurements: Height: 5\' 5"  (165.1 cm) Weight: 94 kg (207 lb 3.7 oz) IBW/kg (Calculated) : 57 Heparin Dosing Weight: 77kg  Vital Signs: Temp: 98.6 F (37 C) (09/12 0400) Temp Source: Axillary (09/12 0400) BP: 151/91 (09/12 0500) Pulse Rate: 95 (09/12 0500)  Labs: Recent Labs    01/10/22 1509 01/10/22 1757 01/11/22 0532 01/11/22 1001 01/12/22 0027 01/12/22 0203 01/12/22 0541  HGB 8.4*  --  8.2*  --   --  8.4*  --   HCT 25.0*  --  24.2*  --   --  24.5*  --   PLT 293  --  317  --   --  348  --   APTT  --   --   --   --   --   --  45*  HEPARINUNFRC  --   --   --   --   --   --  0.88*  CREATININE  --  4.54* 4.16*  --   --  3.58*  --   TROPONINIHS  --   --   --  3,546* 3,807* 3,512*  --      Estimated Creatinine Clearance: 23.9 mL/min (A) (by C-G formula based on SCr of 3.58 mg/dL (H)).   Medical History: Past Medical History:  Diagnosis Date   Anemia    CAD (coronary artery disease)    a. s/p cath in 03/2014 showing 30% mid-LAD, moderate to severe disease along small D1, patent LCx, moderate to severe distal OM2 stenosis and moderate diffuse diease along RCA not amenable to PCI   CHF (congestive heart failure) ([place])    a. EF 55-60% in 12/2019 b. EF at 35-40% by echo in 05/2020   CKD (chronic kidney disease) stage 4, GFR 15-29 ml/min (HCC)    Diabetes mellitus without complication ([place])    Myocardial infarction ([place])    Neuropathy    Stroke [facility])       Assessment: 77 yoF admitted with R MCA vertebral stroke s/p thrombectomy and stent placement. Pt is on apixaban prior to admit, no anticoagulation received this admission. Pt with elevated troponins and now chest pain, pharmacy  asked to dose IV heparin for ACS management. Will defer bolus and utilize lower goal in setting of recent CVA. Will check aPTT along with initial heparin level but can likely dose via heparin levels since last dose of apixaban was 9/7.  Initial heparin level 0.88 and altered by recent DOAC use, aPTT is low at 45 seconds. Levels were drawn slightly early so will increase by only a small amount.  Goal of Therapy:  Heparin level 0.3-0.5 units/ml aPTT 66-85 seconds Monitor platelets by anticoagulation protocol: Yes   Plan:  Increase heparin to 1000 units/h Recheck aPTT in 8h   Arrie Senate, PharmD, [place], [facility] Clinical Pharmacist Please check AMION for all [facility] numbers 01/12/2022

## 2022-01-12 NOTE — Consult Note (Signed)
[facility] Nurse Consult Note: Reason for Consult: left foot wound; s/p 5th toe amputation 09/02/21 Has osteomyelitis in the 4th toe as well, pending further surgical intervention for this  Wound type: surgical  Pressure Injury POA: NA Measurement: no open wound appreciated today during my assessment; healed surgical wound Wound bed: no open wound Drainage (amount, consistency, odor) none Periwound: Intact  Dressing procedure/placement/frequency: Paint area with betadine, allow to air dry. Will continue this simply because family was doing that at home and wishes to continue.  Follow up with podiatry as scheduled.   Discussed POC with patient's husband and bedside nurse.  Re consult if needed, will not follow at this time. Thanks  Damica Gravlin [employer], RN,CWOCN, CNS, [place] [phone])

## 2022-01-12 NOTE — Progress Notes (Addendum)
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   Patient presented to the ED 9/8 with left sided facial droop, left sided weakness, and slurred speech.  LKW: unknown.  Code stroke initiated. NIHSS: 8.  Imaging revealed: age indeterminate occlusion of proximal right ICA, V3 segment of right vertebral artery, left paraclinoid ICA stenosis. No core infarct or penumbra. Patient went immediately to IR for thrombectomy.  Of note, patient with recent arterial thrombectomy of the right upper extremity due to limb ischemia.  She has had URI x 2 days with recent sick contact.   COVID negative but Rhinovirus positive. Extubated in ICU post procedure.    Pertinent  Medical History  Anemia  CAD s/p MI CHF - LVEF 55-60%, down to 35-40% in 05/2020 CKD - Stage IV DM  Neuropathy  LLE Toe Amputation R ICA CVA   Significant Hospital Events: Including procedures, antibiotic start and stop dates in addition to other pertinent events   9/8 Admitted with left sided weakness, facial droop.  NIHSS 8, to IR  for thrombectomy, intubated.  MRSA PCR +, Rhinovirus + 9/10 PSV wean, moving RUE/RLE, extubated  9/11 ECHO>, CT head negative for acute changes, Trach asp negative,   Interim History / Subjective:  Chest pain overnight, started on heparin and nitro GTT by cardiology. Diuresed with 60 lasix IV X1 by neph.   OT/PT rec IPR, failed SLP evaluation again, possible CIR candidate,   Tmax 99  Negatieve 2L past 24 hours, +3L admission  Remains on BiPAP  Drips: Nitro 25 mcg, heparin gtt  Shakes head no when asked if her chest is hurting  Objective   Blood pressure (!) 151/91, pulse 95, temperature  98.6 F (37 C), temperature source Axillary, resp. rate 19, height 5\' 5"  (1.651 m), weight 94 kg, last menstrual period 11/01/2021, SpO2 99 %.    Vent Mode: BIPAP FiO2 (%):  [30 %] 30 % Plateau Pressure:  [8 cmH20-11 cmH20] 11 cmH20   Intake/Output Summary (Last 24 hours) at 01/12/2022 2229 Last data filed at 01/12/2022 0500 Gross per 24 hour  Intake 694.88 ml  Output 2750 ml  Net -2055.12 ml   Filed Weights   01/10/22 0500 01/11/22 0500 01/12/22 0500  Weight: 94.8 kg 95.4 kg 94 kg    Examination: General: In bed, NAD, appears comfortable HEENT: MM pink/moist, anicteric, atraumatic Neuro: RASS -1, PERRL 95mm, Follows commands on right, opens eyes to voice CV: S1S2, NSR, no m/r/g appreciated PULM:  air movement in all lobes, mild wheezes, trachea midline, chest expansion symmetric GI: soft, bsx4 active, non-tender   Extremities: warm/dry, no pretibial edema, capillary refill less than 3 seconds  Skin:  no rashes or lesions noted  Labs/Imaging BC NGTD day 3 Bg 105-310 (Tube feeding started) Troponin: 71 > 1.3 K >8 16 > 3.8 K >3.8 K >3.5 K WBC 26.5 > 19.4 > 21.5 > 17.3 > 19.1 Hemoglobin 8.4 > 8.2 > 8.4 Creatinine 4.54 > 4.16 > 3.58, BUN 42 > 42 > 55 CT head negative for acute changes Echo 9/10: LVEF 30 to 35%, left ventricle mildly decreased function, akinesis of the mid to apical septal LV segments, all apical segments,  and apex.  Significant thinning of the mid to apical septal segments and apex reported as likely representing scarring/infarct.  Resolved Hospital Problem list     Assessment & Plan:   Right MCA and right vertebral stroke s/p thrombectomy, right ICA dissection repair, stent placement Hx R Hand Ischemia s/p Thrombectomy 10/2021 Dysphagia  Not a candidate for thrombolytics, was on Eliquis PTA. Post procedure CT Head negative for ICH. S/p cangrelor. Repeat CTH on 9/11 negative for acute changes -Management per neurology. Appreciate Neurology and IR  assistance -Continue SPB goal 120-140 -Continue ASA + Brilenta -Trend neuro exam -Continue PT, OT, and SLP evaluation  Acute Hypoxic Respiratory Failure  Rhinovirus Positive  Acute Exacerbation of Suspected COPD / Bronchospasm  Tobacco Abuse  No documented hx of underlying lung disease but at minimum 1ppd smoker. Positive for rhinovirus. On 30% BiPAP. -Try to wean BiPAP as tolerated -Continue solumedrol -Continue brovana, pulmicort, yupelri nebs -Aspiration precautions -Aggressive pulmonary hygiene. -Encourage tobacco cessation.  AKI on CKD IV Acute Metabolic Acidosis 2/2 DKA  Contrast Induced Nephropathy  Admit sr cr 2.0, DKA on admit resolved. UA unremarkable. AKI suspected to be related to CIN. Diuresed 9/10 and 9/11 with good response. Creatinine 4.54 > 4.16 > 3.58, BUN 42 > 42 > 55 -Ensure renal perfusion. Goal MAP 65 or greater. -Avoid neprotoxic drugs as possible. -Strict I&O's -Follow up AM creatinine -Diuresing with 60 lasix IV X1 again.  Normocytic Anemia Multifactorial in setting of blood loss, chronic disease. PRBC 9/10, Hemoglobin 8.4 > 8.2 > 8.4 -Transfuse PRBC if HBG less than 7 -Obtain AM CBC to trend H&H -Monitor for signs of bleeding  Chronic Systolic & Diastolic CHF  CAD  Troponin elevation Chest Discomfort  HTN ECHO 10/2021 with LVEF 40-45%, G2DDEcho 9/10: LVEF 30 to 35%, left ventricle mildly decreased function, akinesis of the mid to apical septal LV segments, all apical segments, and apex.  Significant thinning of the mid to apical septal segments and apex reported as likely representing scarring/infarct. Patient with C/O chest pain overnight 9/11. Started on heparin gtt and NTG GTT by cards. Troponin: 71 > 1.3 K >8 16 > 3.8 K >3.8 K >3.5 K -cardiology following, appreciating assistance. 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  -follow up with Podiatry -plan for metatarsal head resection, proximal phalangeal resection  -continue Augmentin for foot infection (chronic)  DM I Bg 105-310 (Tube feeding started) -Appreciate DM assistance -Sliding scale increased from moderate to resistant (on steroids) -Continue TF coverage -semglee 30u [place]  At Risk Malnutrition  -Continue TF   Best Practice (right click and "Reselect all SmartList Selections" daily)  Diet/type: tubefeeds DVT prophylaxis: systemic heparin GI prophylaxis: PPI Lines: Central line Foley:  Yes, and it is still needed Code Status:  full code Last date of multidisciplinary goals of care discussion: 9/11    Critical Care Time: 40 minutes    The patient is critically ill with multiple organ systems failure and requires high complexity decision making for assessment and support, frequent evaluation and titration of therapies, application of advanced monitoring technologies and extensive interpretation of multiple databases.    Critical Care Time devoted to patient care services described in this note is 40 minutes. This time reflects time of care of this signee Christia Reading  Iona Beard NP. This critical care time does not reflect procedure time but could involve care discussion time with the PCCM attending.  Redmond School., MSN, APRN, AGACNP-BC [facility]  01/12/2022 , 6:39 AM  Please see [email or web address] for pager details  If no response, please call [phone] After hours, please call Elink at [phone]

## 2022-01-12 NOTE — Progress Notes (Signed)
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    PT Comments    Patient seen with SLP for improved arousal to help with p.o. trials.  She was able to assist with sitting up on EOB and balance improved with less support on L side (sitting unsupported with close S for over 5 minutes), but fatigued over time and with distractions or movement of L LE to the R needed up to mod A for balance.  She attempts to assist with bed mobility as well for scooting up in bed.  PT will continue to follow acutely.  Recommendations for acute inpatient rehab remain appropriate.   Recommendations for follow up therapy are one component of a multi-disciplinary discharge planning process, led by the attending physician.  Recommendations may be updated based on patient status, additional functional criteria and insurance authorization.  Follow Up Recommendations  Acute inpatient rehab (3hours/day)     Assistance Recommended at Discharge Frequent or constant Supervision/Assistance  Patient can return home with the following Assist for transportation;Assistance with cooking/housework;Two people to help with walking and/or transfers;A lot of help with bathing/dressing/bathroom;Help with stairs or ramp for entrance   Equipment Recommendations  Other (comment) (TBA at next venue)    Recommendations for Other Services Rehab consult     Precautions / Restrictions Precautions Precautions: Fall Precaution Comments: L inattention, R gaze preference, L  hemiplegia Restrictions Weight Bearing Restrictions: No     Mobility  Bed Mobility Overal bed mobility: Needs Assistance Bed Mobility: Sidelying to Sit, Sit to Supine, Rolling Rolling: Min assist Sidelying to sit: Mod assist   Sit to supine: Mod assist, +2 for physical assistance   General bed mobility comments: guiding R UE to L rail due to poor L side awareness, assist for trunk upright, to supine assist for legs and trunk and pt assisting some to scoot up in bed with +2 a    Transfers                   General transfer comment: NT today, was on BiPap earlier and focus on SLP for awareness/attention and p.o. trials    Ambulation/Gait                   Stairs             Wheelchair Mobility    Modified Rankin (Stroke Patients Only) Modified Rankin (Stroke Patients Only) Pre-Morbid Rankin Score: No symptoms Modified Rankin: Severe disability     Balance Overall balance assessment: Needs assistance Sitting-balance support: Feet supported, Single extremity supported Sitting balance-Leahy Scale: Poor Sitting balance - Comments: close S to min A for balance on EOB and pt needing cues for reminder to lean R and occasionally holding foot board on bed Postural control: Left lateral lean   Standing balance-Leahy Scale: Zero                              Cognition Arousal/Alertness: Awake/alert Behavior During Therapy: Restless Overall Cognitive Status: Difficult to assess  Area of Impairment: Attention, Safety/judgement, Following commands                   Current Attention Level: Sustained   Following Commands: Follows one step commands inconsistently, Follows one step commands with increased time Safety/Judgement: Decreased awareness of safety, Decreased awareness of deficits     General Comments: more sustained attention to task seated EOB with SLP for po trials        Exercises      General Comments General  comments (skin integrity, edema, etc.): SpO2 95% on 3-4L O2 via [place], frequent drainage from nose with sneezing after coretrack placement earlier with some bloody drainage as well      Pertinent Vitals/Pain Pain Assessment Faces Pain Scale: Hurts little more Pain Location: seems bothered by the coretrack Pain Descriptors / Indicators: Discomfort, Grimacing Pain Intervention(s): Monitored during session    Home Living   Living Arrangements: Spouse/significant other Available Help at Discharge: Family;Available 24 hours/day Type of Home: Mobile home Home Access: Stairs to enter Entrance Stairs-Rails: Psychiatric nurse of Steps: 5   Home Layout: One level Home Equipment: None      Prior Function            PT Goals (current goals can now be found in the care plan section) Progress towards PT goals: Progressing toward goals    Frequency    Min 4X/week      PT Plan Current plan remains appropriate    Co-evaluation PT/OT/SLP Co-Evaluation/Treatment: Yes Reason for Co-Treatment: For patient/therapist safety;Complexity of the patient's impairments (multi-system involvement) PT goals addressed during session: Mobility/safety with mobility;Balance        AM-PAC PT "6 Clicks" Mobility   Outcome Measure  Help needed turning from your back to your side while in a flat bed without using bedrails?: A Lot Help needed moving from lying on your back to sitting on the side of a flat bed without using bedrails?: Total Help needed moving to and from a bed to a chair (including a wheelchair)?: Total Help needed standing up from a chair using your arms (e.g., wheelchair or bedside chair)?: Total Help needed to walk in hospital room?: Total Help needed climbing 3-5 steps with a railing? : Total 6 Click Score: 7    End of Session Equipment Utilized During Treatment: Gait belt;Oxygen Activity Tolerance: Patient limited by fatigue Patient left: in bed;with call  bell/phone within reach;with bed alarm set   PT Visit Diagnosis: Muscle weakness (generalized) (M62.81);Hemiplegia and hemiparesis;Other abnormalities of gait and mobility (R26.89) Hemiplegia - Right/Left: Left Hemiplegia - dominant/non-dominant: Non-dominant Hemiplegia - caused by: Cerebral infarction     Time: 1325-1356 PT Time Calculation (min) (ACUTE ONLY): 31 min  Charges:  $Therapeutic Activity: 8-22 mins $Neuromuscular Re-education: 8-22 mins                     Magda Kiel, PT Acute Rehabilitation Services Office:[phone] 01/12/2022    Reginia Naas 01/12/2022, 4:12 PM

## 2022-01-12 NOTE — Progress Notes (Addendum)
eLink Physician-Brief Progress Note Patient Name: Lauren [name] DOB: 01-24-1981 MRN: [id number]   Date of Service  01/12/2022  HPI/Events of Note  Pt complained of chest pain and evaluated by cardiology.   41/F with CAD, CHD, DM, CKD, PVD admitted with right MCA and vertebral stroke 01/08/22 s/p thrombectomy, complaining of recurrent chest pain. EKG with sinus tachycardia, no ST segment elevations.  Pt points to her chest when asked where her pain is.  She denies epigastric pain.  BP 184/112, HR 111, RR 26, o2 sats 95%.   Troponin this morning at 3,869 <-- 860 CXR 01/11/22 Cardiomegaly with mild, diffuse bilateral interstitial opacity and probable small, layering pleural effusions most likely edema. No new or focal airspace opacity.  eICU Interventions  Neurology has cleared initiation of heparin gtt.  Check troponin and trend.  Continue dual antiplatelet therapy. Nitroglycerin gtt ordered by cardiology.      Intervention Category Major Interventions: Other:  Lauren [name] 01/12/2022, 12:24 AM

## 2022-01-12 NOTE — Progress Notes (Signed)
Speech Language Pathology Treatment: Dysphagia  Patient Details Name: Lauren [name] MRN: [id number] DOB: 1981-05-01 Today's Date: 01/12/2022 Time: 4098-1191 SLP Time Calculation (min) (ACUTE ONLY): 25 min  Assessment / Plan / Recommendation Clinical Impression  Patient seen by SLP for skilled treatment session focused on dysphagia goals. Patient was awake and alert, sitting at EOB with PT in room working with her as well. She was able to follow basic commands to attempt to stick out tongue, open mouth, etc. She was able to use toothette sponge to perform oral care and was able to hold cup with very small amount of water in it and give self a sip. She did not vocalize or verbalize during this session. With first spoon sip of water, entire amount quickly spilled from anterior portion of mouth. SLP then observed her with two small sips from cup (approximately 5cc of water in it).She did not exhibit any swallow initiation with second cup sip but also no anterior spillage and no significant oral residuals from water. Patient was able to initiate an approximated throat clear with SLP cues but not able to clear any secretions.  SLP will f/u next date for continued attempts at PO trials.    HPI HPI: Lauren [name] is a 41 y.o. female who presented to the ED for acute onset of chest pain with left side weakness. CT head right MCA and vertebral stroke; underwent IR thrombectomy of R ICA with R ICA dissection repair and stent placement with TICI 3. Intubated 9/8-10.  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      SLP Plan  Continue with current plan of care      Recommendations for follow up therapy are one component of a  multi-disciplinary discharge planning process, led by the attending physician.  Recommendations may be updated based on patient status, additional functional criteria and insurance authorization.    Recommendations  Diet recommendations: NPO Medication Administration: Via alternative means                Oral Care Recommendations: Oral care QID;Staff/trained caregiver to provide oral care Follow Up Recommendations: Acute inpatient rehab (3hours/day) Assistance recommended at discharge: Frequent or constant Supervision/Assistance SLP Visit Diagnosis: Dysphagia, unspecified (R13.10) Plan: Continue with current plan of care          Sonia Baller, MA, CCC-SLP Speech Therapy

## 2022-01-12 NOTE — Progress Notes (Signed)
Bipap not indicated at this time, pt comfortable on [place]. RT will continue to monitor as needed throughout night.

## 2022-01-12 NOTE — Progress Notes (Signed)
[facility] for heparin Indication: chest pain/ACS  Allergies  Allergen Reactions   Wellbutrin [Bupropion] Hives   Cefepime Rash    Tolerates penicilllin   Ciprofloxacin Hcl Hives and Rash    Hives/rash at injection site    Tape Rash    Patient Measurements: Height: 5\' 5"  (165.1 cm) Weight: 94 kg (207 lb 3.7 oz) IBW/kg (Calculated) : 57 Heparin Dosing Weight: 77kg  Vital Signs: Temp: 98.5 F (36.9 C) (09/12 1600) Temp Source: Axillary (09/12 1600) BP: 162/95 (09/12 1730) Pulse Rate: 91 (09/12 1730)  Labs: Recent Labs    01/10/22 1509 01/10/22 1757 01/11/22 0532 01/11/22 1001 01/12/22 0027 01/12/22 0203 01/12/22 0541 01/12/22 1702  HGB 8.4*  --  8.2*  --   --  8.4*  --   --   HCT 25.0*  --  24.2*  --   --  24.5*  --   --   PLT 293  --  317  --   --  348  --   --   APTT  --   --   --   --   --   --  45* 43*  HEPARINUNFRC  --   --   --   --   --   --  0.88*  --   CREATININE  --  4.54* 4.16*  --   --  3.58*  --   --   TROPONINIHS  --   --   --  9,628* 3,807* 3,512*  --   --      Estimated Creatinine Clearance: 23.9 mL/min (A) (by C-G formula based on SCr of 3.58 mg/dL (H)).   Assessment: 40 yoF admitted with R MCA vertebral stroke s/p thrombectomy and stent placement. Pt is on apixaban prior to admit, no anticoagulation received this admission. Pt with elevated troponins and now chest pain, pharmacy asked to dose IV heparin for ACS management. Will defer bolus and utilize lower goal in setting of recent CVA. Will check aPTT along with initial heparin level as apixaban may still be affecting level  Initial heparin level 0.88 and altered by recent DOAC use.  aPTT subtherapeutic (43 sec) on infusion at 1000 units/hr. No issues with line or bleeding reported per RN.  Goal of Therapy:  Heparin level 0.3-0.5 units/ml aPTT 66-85 seconds Monitor platelets by anticoagulation protocol: Yes   Plan:  Increase heparin to 1150  units/h Recheck aPTT in [place], PharmD, BCPS Please see amion for complete clinical pharmacist phone list 01/12/2022

## 2022-01-12 NOTE — Plan of Care (Signed)
Problem: Education: Goal: Ability to describe self-care measures that may prevent or decrease complications (Diabetes Survival Skills Education) will improve Outcome: Progressing Goal: Individualized Educational Video(s) Outcome: Progressing   Problem: Coping: Goal: Ability to adjust to condition or change in health will improve Outcome: Progressing   Problem: Fluid Volume: Goal: Ability to maintain a balanced intake and output will improve Outcome: Progressing   Problem: Health Behavior/Discharge Planning: Goal: Ability to identify and utilize available resources and services will improve Outcome: Progressing Goal: Ability to manage health-related needs will improve Outcome: Progressing   Problem: Metabolic: Goal: Ability to maintain appropriate glucose levels will improve Outcome: Progressing   Problem: Nutritional: Goal: Maintenance of adequate nutrition will improve Outcome: Progressing Goal: Progress toward achieving an optimal weight will improve Outcome: Progressing   Problem: Skin Integrity: Goal: Risk for impaired skin integrity will decrease Outcome: Progressing   Problem: Tissue Perfusion: Goal: Adequacy of tissue perfusion will improve Outcome: Progressing   Problem: Education: Goal: Understanding of CV disease, CV risk reduction, and recovery process will improve Outcome: Progressing Goal: Individualized Educational Video(s) Outcome: Progressing   Problem: Activity: Goal: Ability to return to baseline activity level will improve Outcome: Progressing   Problem: Cardiovascular: Goal: Ability to achieve and maintain adequate cardiovascular perfusion will improve Outcome: Progressing Goal: Vascular access site(s) Level 0-1 will be maintained Outcome: Progressing   Problem: Health Behavior/Discharge Planning: Goal: Ability to safely manage health-related needs after discharge will improve Outcome: Progressing   Problem: Activity: Goal: Ability to  tolerate increased activity will improve Outcome: Progressing   Problem: Respiratory: Goal: Ability to maintain a clear airway and adequate ventilation will improve Outcome: Progressing   Problem: Role Relationship: Goal: Method of communication will improve Outcome: Progressing   Problem: Education: Goal: Knowledge of General Education information will improve Description: Including pain rating scale, medication(s)/side effects and non-pharmacologic comfort measures Outcome: Progressing   Problem: Health Behavior/Discharge Planning: Goal: Ability to manage health-related needs will improve Outcome: Progressing   Problem: Clinical Measurements: Goal: Ability to maintain clinical measurements within normal limits will improve Outcome: Progressing Goal: Will remain free from infection Outcome: Progressing Goal: Diagnostic test results will improve Outcome: Progressing Goal: Respiratory complications will improve Outcome: Progressing Goal: Cardiovascular complication will be avoided Outcome: Progressing   Problem: Activity: Goal: Risk for activity intolerance will decrease Outcome: Progressing   Problem: Nutrition: Goal: Adequate nutrition will be maintained Outcome: Progressing   Problem: Coping: Goal: Level of anxiety will decrease Outcome: Progressing   Problem: Elimination: Goal: Will not experience complications related to bowel motility Outcome: Progressing Goal: Will not experience complications related to urinary retention Outcome: Progressing   Problem: Pain Managment: Goal: General experience of comfort will improve Outcome: Progressing   Problem: Safety: Goal: Ability to remain free from injury will improve Outcome: Progressing   Problem: Skin Integrity: Goal: Risk for impaired skin integrity will decrease Outcome: Progressing   Problem: Education: Goal: Knowledge of disease or condition will improve Outcome: Progressing Goal: Knowledge of  secondary prevention will improve (SELECT ALL) Outcome: Progressing Goal: Knowledge of patient specific risk factors will improve (INDIVIDUALIZE FOR PATIENT) Outcome: Progressing Goal: Individualized Educational Video(s) Outcome: Progressing   Problem: Coping: Goal: Will verbalize positive feelings about self Outcome: Progressing Goal: Will identify appropriate support needs Outcome: Progressing   Problem: Health Behavior/Discharge Planning: Goal: Ability to manage health-related needs will improve Outcome: Progressing   Problem: Self-Care: Goal: Ability to participate in self-care as condition permits will improve Outcome: Progressing Goal: Verbalization of feelings and concerns over  difficulty with self-care will improve Outcome: Progressing Goal: Ability to communicate needs accurately will improve Outcome: Progressing   Problem: Nutrition: Goal: Risk of aspiration will decrease Outcome: Progressing Goal: Dietary intake will improve Outcome: Progressing   Problem: Ischemic Stroke/TIA Tissue Perfusion: Goal: Complications of ischemic stroke/TIA will be minimized Outcome: Progressing

## 2022-01-12 NOTE — Plan of Care (Signed)
Patient remains in Neuro ICU overnight. Tenuous respiratory status; unable to lie flat without difficulty breathing - will need to defer MRI (brain) until patient able to tolerate flat test.   Problem: Education: Goal: Ability to describe self-care measures that may prevent or decrease complications (Diabetes Survival Skills Education) will improve Outcome: Not Progressing Goal: Individualized Educational Video(s) Outcome: Not Progressing   Problem: Coping: Goal: Ability to adjust to condition or change in health will improve Outcome: Not Progressing   Problem: Fluid Volume: Goal: Ability to maintain a balanced intake and output will improve Outcome: Not Progressing   Problem: Health Behavior/Discharge Planning: Goal: Ability to identify and utilize available resources and services will improve Outcome: Not Progressing Goal: Ability to manage health-related needs will improve Outcome: Not Progressing   Problem: Metabolic: Goal: Ability to maintain appropriate glucose levels will improve Outcome: Not Progressing   Problem: Nutritional: Goal: Maintenance of adequate nutrition will improve Outcome: Not Progressing Goal: Progress toward achieving an optimal weight will improve Outcome: Not Progressing   Problem: Skin Integrity: Goal: Risk for impaired skin integrity will decrease Outcome: Not Progressing   Problem: Tissue Perfusion: Goal: Adequacy of tissue perfusion will improve Outcome: Not Progressing   Problem: Education: Goal: Understanding of CV disease, CV risk reduction, and recovery process will improve Outcome: Not Progressing Goal: Individualized Educational Video(s) Outcome: Not Progressing   Problem: Activity: Goal: Ability to return to baseline activity level will improve Outcome: Not Progressing   Problem: Cardiovascular: Goal: Ability to achieve and maintain adequate cardiovascular perfusion will improve Outcome: Not Progressing Goal: Vascular access  site(s) Level 0-1 will be maintained Outcome: Not Progressing   Problem: Health Behavior/Discharge Planning: Goal: Ability to safely manage health-related needs after discharge will improve Outcome: Not Progressing   Problem: Activity: Goal: Ability to tolerate increased activity will improve Outcome: Not Progressing   Problem: Respiratory: Goal: Ability to maintain a clear airway and adequate ventilation will improve Outcome: Not Progressing   Problem: Role Relationship: Goal: Method of communication will improve Outcome: Not Progressing   Problem: Education: Goal: Knowledge of General Education information will improve Description: Including pain rating scale, medication(s)/side effects and non-pharmacologic comfort measures Outcome: Not Progressing   Problem: Health Behavior/Discharge Planning: Goal: Ability to manage health-related needs will improve Outcome: Not Progressing   Problem: Clinical Measurements: Goal: Ability to maintain clinical measurements within normal limits will improve Outcome: Not Progressing Goal: Will remain free from infection Outcome: Not Progressing Goal: Diagnostic test results will improve Outcome: Not Progressing Goal: Respiratory complications will improve Outcome: Not Progressing Goal: Cardiovascular complication will be avoided Outcome: Not Progressing   Problem: Activity: Goal: Risk for activity intolerance will decrease Outcome: Not Progressing   Problem: Nutrition: Goal: Adequate nutrition will be maintained Outcome: Not Progressing   Problem: Coping: Goal: Level of anxiety will decrease Outcome: Not Progressing   Problem: Elimination: Goal: Will not experience complications related to bowel motility Outcome: Not Progressing Goal: Will not experience complications related to urinary retention Outcome: Not Progressing   Problem: Pain Managment: Goal: General experience of comfort will improve Outcome: Not Progressing    Problem: Safety: Goal: Ability to remain free from injury will improve Outcome: Not Progressing   Problem: Skin Integrity: Goal: Risk for impaired skin integrity will decrease Outcome: Not Progressing   Problem: Education: Goal: Knowledge of disease or condition will improve Outcome: Not Progressing Goal: Knowledge of secondary prevention will improve (SELECT ALL) Outcome: Not Progressing Goal: Knowledge of patient specific risk factors will improve (INDIVIDUALIZE FOR PATIENT)  Outcome: Not Progressing Goal: Individualized Educational Video(s) Outcome: Not Progressing   Problem: Coping: Goal: Will verbalize positive feelings about self Outcome: Not Progressing Goal: Will identify appropriate support needs Outcome: Not Progressing   Problem: Health Behavior/Discharge Planning: Goal: Ability to manage health-related needs will improve Outcome: Not Progressing   Problem: Self-Care: Goal: Ability to participate in self-care as condition permits will improve Outcome: Not Progressing Goal: Verbalization of feelings and concerns over difficulty with self-care will improve Outcome: Not Progressing Goal: Ability to communicate needs accurately will improve Outcome: Not Progressing   Problem: Nutrition: Goal: Risk of aspiration will decrease Outcome: Not Progressing Goal: Dietary intake will improve Outcome: Not Progressing

## 2022-01-12 NOTE — Progress Notes (Signed)
Initial Nutrition Assessment  DOCUMENTATION CODES:   Not applicable  INTERVENTION:   Initiate tube feeding via Cortrak tube: Glucerna 1.5 at 35 ml/h and advance by 10 ml every 8 hours to goal rate of 55 ml/h (1320 ml per day) Prosource TF20 60 ml daily  Provides 2060 kcal, 128 gm protein, 1003 ml free water daily   NUTRITION DIAGNOSIS:   Inadequate oral intake related to inability to eat as evidenced by NPO status.  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   Pt discussed during ICU rounds and with RN and MD.  Pt transitioning to HFNC from BiPAP. Cortrak placed.    Medications reviewed and include: colace, SSI, novolog every 4 hours, 30 units semglee daily, synthroid, solumedrol, protonix, miralax,    Labs reviewed: PO4 4.9 CBG's: [id number]  NUTRITION - FOCUSED PHYSICAL EXAM:  WNL  Diet Order:   Diet Order             Diet NPO time specified  Diet effective now                   EDUCATION NEEDS:   No education needs have been identified at this time  Skin:  Skin Assessment: Reviewed RN Assessment  Last BM:  9/12  Height:   Ht Readings from Last 1 Encounters:  01/08/22 5\' 5"  (1.651 m)    Weight:   Wt Readings from Last 1 Encounters:  01/12/22 94 kg    BMI:  Body mass index is 34.49 kg/m.  Estimated Nutritional Needs:   Kcal:  2000-2400  Protein:  105-115 grams  Fluid:  2 L/day  Lockie Pares., RD, LDN, CNSC See AMi[facility] for contact information

## 2022-01-12 NOTE — PMR Pre-admission (Signed)
PMR Admission Coordinator Pre-Admission Assessment  Patient: Lauren [name] is an 41 y.o., female MRN: [id number] DOB: 06-26-1980 Height: _0  (165.1 cm) Weight: 90.7 kg  Insurance Information HMO:     PPO:      PCP:      IPA:      80/20:      OTHER:  PRIMARY: [place] [employer] Plan      Policy#: [id number]   Group # [id number]   Subscriber: Patient CM Name: Chastity      Phone#: [phone]     Fax#: [phone] Pre-Cert#: [id number]  approved for 7 days from admit    Employer: [employer]/ [employer] Benefits:  Phone #: [phone]     Name: 9/19 Eff. Date: 06/03/21     Deduct: none      Out of Pocket Max: $2500      Life Max: none CIR: $1150 co pay per admission      SNF: 80% 120 days per year Outpatient: 80%     Co-Pay: visits per medical neccesity Home Health: 80%      Co-Pay: visit per medical necessity DME: 80%     Co-Pay: 20% Providers: in network  SECONDARY: none      Financial Counselor:       Phone#:   [employer]" for patients in Inpatient Rehabilitation Facilities with attached "Privacy Act [place] Records" was provided and verbally reviewed with: N/A  Emergency Contact Information Contact Information     Name Relation Home Work Mobile   [place] Spouse [phone]  [place]   [place] Father   [phone]   [place], Dick Daughter   [phone]      Current Medical History  Patient Admitting Diagnosis: CVA  History of Present Illness: 41 year old female presented to [facility] hospital 01/08/22 with left sided facial droop, left sided weakness, and slurred speech. 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 No core infarct or penumbra. Patient went immediately to IR for thrombectomy and noted R ICA dissection requiring repair.   Of note, patient with recent arterial  thrombectomy of the right upper extremity due to limb ischemia. She has had URI x 2 days with recent sick contact. COVID negative but Rhinovirus positive. Extubated 9/10 on BiPAP initially .  On 9/13 HFNC and then febrile. Re intubated on 9/15 with eventual extubation on 9/18. ID changed antimicrobial coverage to cefadroxil and flagyl. Required Precedex for restlessness in ICU. Pulmonary felt respiratory distress is more so due to volume. Treated with IV lasix, Maretta Bees, Brovana and pulmicort nebulizer and aspiration precautions. Continue to titrate and wean Oxygen. Smoking cessation counseling.   Echo on 9/14 with EF 35 to 40 % and grade II diastolic dysfunction. Cardiology consulted and felt likely to need ischemic eval as outpatient. Continue isosorbide mononitrate, metoprolol . Atorvastatin and brilinta. No ACEi/ARB/ARNIU/MRA/SGLT2 given renal funtion. Anemia felt secondary to anemia of chronic kidney disease. Transfused this admit and to continue to monitor. Type 1 DM on Levemir and SSI. Synthroid for hypothyroidism.   Left foot osteomyelitis and diabetic ulcer. Podiatry consulted with no acute OR plans. Cefadroxil and metronidazole per ID. Nutrition per cortrak feeds with SLP to follow.  On eliquis prior to admit. Continue Statin and brilinta. Neuro recommended repeat hyper coag workup in 3 months.   Complete NIHSS TOTAL: 16  Patient's medical record from [facility]  has been reviewed by the rehabilitation admission coordinator and physician.  Past Medical History  Past Medical History:  Diagnosis Date   Anemia    CAD (coronary artery disease)    a. s/p cath in 03/2014 showing 30% mid-LAD, moderate to severe disease along small D1, patent LCx, moderate to severe distal OM2 stenosis and moderate diffuse diease along RCA not amenable to PCI   CHF (congestive heart failure) ([place])    a. EF 55-60% in 12/2019 b. EF at 35-40% by echo in 05/2020   CKD (chronic kidney disease) stage 4, GFR 15-29  ml/min (HCC)    Diabetes mellitus without complication ([place])    Myocardial infarction ([place])    Neuropathy    Stroke ([place])    Has the patient had major surgery during 100 days prior to admission? no  Family History   family history includes Breast cancer (age of onset: 55) in her paternal grandmother; CAD in her father and mother; CVA in her mother; Cancer in her paternal grandmother; Hyperlipidemia in her father and mother; Hypertension in her father and mother; Thyroid disease in her mother.  Current Medications  Current Facility-Administered Medications:    acetaminophen (TYLENOL) tablet 650 mg, 650 mg, Oral, Q4H PRN, 650 mg at 01/27/22 1808 **OR** acetaminophen (TYLENOL) 160 MG/5ML solution 650 mg, 650 mg, Per Tube, Q4H PRN, 650 mg at 01/25/22 0422 **OR** acetaminophen (TYLENOL) suppository 650 mg, 650 mg, Rectal, Q4H PRN, Deveshwar, Sanjeev, MD   acetaminophen (TYLENOL) tablet 500 mg, 500 mg, Oral, TID, Pahwani, Ravi, MD, 500 mg at 01/28/22 0832   arformoterol (BROVANA) nebulizer solution 15 mcg, 15 mcg, Nebulization, BID, Ollis, Brandi L, NP, 15 mcg at 01/28/22 0744   atorvastatin (LIPITOR) tablet 40 mg, 40 mg, Per Tube, Daily, Candee Furbish, MD, 40 mg at 01/27/22 0850   budesonide (PULMICORT) nebulizer solution 0.5 mg, 0.5 mg, Nebulization, BID, Ollis, Brandi L, NP, 0.5 mg at 01/28/22 0750   Chlorhexidine Gluconate Cloth 2 % PADS 6 each, 6 each, Topical, Daily, Mannam, Praveen, MD, 6 each at 01/27/22 1240   docusate sodium (COLACE) capsule 100 mg, 100 mg, Oral, BID PRN, Darliss Cheney, MD   hydrALAZINE (APRESOLINE) tablet 12.5 mg, 12.5 mg, Oral, Q8H, Pahwani, Ravi, MD, 12.5 mg at 01/28/22 0507   influenza vac split quadrivalent PF (FLUARIX) injection 0.5 mL, 0.5 mL, Intramuscular, Prior to discharge, Candee Furbish, MD   insulin aspart (novoLOG) injection 0-20 Units, 0-20 Units, Subcutaneous, Q4H, Icard, Bradley L, DO, 3 Units at 01/28/22 0831   insulin detemir (LEVEMIR) injection 25  Units, 25 Units, Subcutaneous, BID, Pahwani, Ravi, MD, 25 Units at 01/27/22 2143   ipratropium-albuterol (DUONEB) 0.5-2.5 (3) MG/3ML nebulizer solution 3 mL, 3 mL, Nebulization, Q4H PRN, Mannam, Praveen, MD, 3 mL at 01/14/22 0257   isosorbide mononitrate (ISMO) tablet 20 mg, 20 mg, Oral, BID, Pahwani, Ravi, MD   levothyroxine (SYNTHROID) tablet 75 mcg, 75 mcg, Oral, Q0600, Darliss Cheney, MD, 75 mcg at 01/28/22 0508   melatonin tablet 3 mg, 3 mg, Oral, QHS, Pahwani, Ravi, MD   metoprolol tartrate (LOPRESSOR) injection 5 mg, 5 mg, Intravenous, Q8H PRN, Allie Bossier, MD   metoprolol tartrate (LOPRESSOR) tablet 37.5 mg, 37.5 mg, Oral, BID, Pahwani, Ravi, MD   nicotine (NICODERM CQ - dosed in mg/24 hours) patch 21 mg, 21 mg, Transdermal, Daily, Ollis, Brandi L, NP, 21 mg at 01/27/22 0854   nitroGLYCERIN (NITROSTAT) SL tablet 0.4 mg, 0.4 mg, Sublingual, Q5 min PRN, Allie Bossier, MD, 0.4  mg at 01/23/22 1357   Oral care mouth rinse, 15 mL, Mouth Rinse, PRN, Elsie Lincoln, MD, 15 mL at 01/10/22 2034   oxyCODONE (Oxy IR/ROXICODONE) immediate release tablet 5 mg, 5 mg, Oral, Q6H PRN, Darliss Cheney, MD, 5 mg at 01/27/22 1809   pantoprazole (PROTONIX) EC tablet 40 mg, 40 mg, Oral, Daily, Pahwani, Ravi, MD   pneumococcal 20-valent conjugate vaccine (PREVNAR 20) injection 0.5 mL, 0.5 mL, Intramuscular, Prior to discharge, Candee Furbish, MD   polyethylene glycol (MIRALAX / GLYCOLAX) packet 17 g, 17 g, Oral, Daily PRN, Pahwani, Ravi, MD   revefenacin (YUPELRI) nebulizer solution 175 mcg, 175 mcg, Nebulization, Daily, Ollis, Brandi L, NP, 175 mcg at 01/28/22 0757   senna-docusate (Senokot-S) tablet 1 tablet, 1 tablet, Oral, QHS PRN, Darliss Cheney, MD   ticagrelor (BRILINTA) tablet 90 mg, 90 mg, Oral, BID, 90 mg at 01/27/22 2139 **OR** ticagrelor (BRILINTA) tablet 90 mg, 90 mg, Per Tube, BID, Deveshwar, Sanjeev, MD, 90 mg at 01/27/22 0851  Patients Current Diet:  Diet Order             DIET DYS 3 Room  service appropriate? Yes; Fluid consistency: Thin  Diet effective now                  Precautions / Restrictions Precautions Precautions: Fall Precaution Comments: L inattention, R gaze preference, L hemiplegia, cortrak; L foot dressing Other Brace: dressing on L foot Restrictions Weight Bearing Restrictions: No Other Position/Activity Restrictions: No active bleeding noted in LLE this afternoon.   Has the patient had 2 or more falls or a fall with injury in the past year? No  Prior Activity Level Community (5-7x/wk): independent in community. Not working due to foot surgery. Driving  Prior Functional Level Self Care: Did the patient need help bathing, dressing, using the toilet or eating? Independent  Indoor Mobility: Did the patient need assistance with walking from room to room (with or without device)? Independent  Stairs: Did the patient need assistance with internal or external stairs (with or without device)? Independent  Functional Cognition: Did the patient need help planning regular tasks such as shopping or remembering to take medications? Independent  Patient Information Are you of Hispanic, Latino/a,or Spanish origin?: A. No, not of Hispanic, Latino/a, or Spanish origin What is your race?: A. White Do you need or want an interpreter to communicate with a doctor or health care staff?: 9. Unable to respond  Patient's Response To:  Health Literacy and Transportation Is the patient able to respond to health literacy and transportation needs?: No Health Literacy - How often do you need to have someone help you when you read instructions, pamphlets, or other written material from your doctor or pharmacy?: Patient unable to respond  Home Assistive Devices / [place] Devices/Equipment: None Home Equipment: None  Prior Device Use: Indicate devices/aids used by the patient prior to current illness, exacerbation or injury? None of the above  Current  Functional Level Cognition  Arousal/Alertness: Lethargic Overall Cognitive Status: Impaired/Different from baseline Difficult to assess due to: Impaired communication Current Attention Level: Focused Orientation Level: Oriented X4 Following Commands: Follows one step commands consistently Safety/Judgement: Decreased awareness of safety, Decreased awareness of deficits General Comments: Pt significantley distracted today and perseverated on being cold and needing blankets. Pt requires constant redirection back to task and constant cues to tend to her L side. Pt with severe L neglect of body and environment. Attention: Focused, Sustained Focused Attention: Impaired Focused Attention Impairment:  Verbal basic Sustained Attention: Impaired Sustained Attention Impairment: Verbal basic Problem Solving: Impaired Problem Solving Impairment: Verbal basic    Extremity Assessment (includes Sensation/Coordination)  Upper Extremity Assessment: LUE deficits/detail RUE Deficits / Details: WFL for tasks assessed in seated position. Pt performing grooming with RUE. MMT not directly assessed RUE Sensation: WNL RUE Coordination: WNL LUE Deficits / Details: No active movement noted during session LUE Sensation: decreased light touch LUE Coordination: decreased fine motor, decreased gross motor  Lower Extremity Assessment: Defer to PT evaluation LLE Deficits / Details: not moving actively, some weight support with knee blocked during brief standing trial; keeping leg externally rotated and flexed in bed, has toes wrapped with kerlix LLE Sensation: history of peripheral neuropathy LLE Coordination: decreased gross motor    ADLs  Overall ADL's : Needs assistance/impaired Eating/Feeding: NPO Grooming: Moderate assistance, Sitting Grooming Details (indicate cue type and reason): Pt requires max cues to finish the task. Pt will start task and become distracted soon after starting. Upper Body Bathing:  Moderate assistance, Sitting (in recliner) Upper Body Bathing Details (indicate cue type and reason): Washing LUE with mod A for placement of LUE; RUE performing washing task. Pt with greater ability to compensate (L head turn and switching gaze toward L ~10 degrees during this task) Lower Body Bathing: Total assistance Upper Body Dressing : Maximal assistance, Sitting Lower Body Dressing: Total assistance, Bed level Toilet Transfer: Maximal assistance, +2 for physical assistance, BSC/3in1 Toilet Transfer Details (indicate cue type and reason): Pt unable to fully stand today c/o pain in LLE. Pt scooted up and down bed with max assist of 1 and use of pad to prepare for transfers to [facility]. Pt unwilling to leave bed bc pt was cold. Toileting- Clothing Manipulation and Hygiene: Total assistance Functional mobility during ADLs: Maximal assistance, +2 for physical assistance, +2 for safety/equipment General ADL Comments: bed level ROM for L hemi body, transferred to sitting with min-max A sitting balance and stood EOB x2    Mobility  Overal bed mobility: Needs Assistance Bed Mobility: Supine to Sit, Sit to Supine, Rolling Rolling: Min assist Sidelying to sit: Mod assist Supine to sit: Max assist, HOB elevated Sit to supine: Mod assist General bed mobility comments: Pt impulsive when getting up to the side of the bed. Pt, despite cues to move one step at a time, attempts to get up unsupervised in a impulsive, unsafe fashion.  Talked to family at length about giving one simple command at a time to attempt to get pt to slow down and become safer with mobility.    Transfers  Overall transfer level: Needs assistance Equipment used: 2 person hand held assist (face to face transfer with gait belt and bed pad) Transfers: Sit to/from Stand, Bed to chair/wheelchair/BSC Sit to Stand: +2 physical assistance, Mod assist Bed to/from chair/wheelchair/BSC transfer type:: Step pivot Squat pivot transfers: Max  assist, +2 physical assistance Step pivot transfers: Max assist, +2 physical assistance General transfer comment: Pt focused on being cold today and unwilling to attempt sit to stand.  Addressed lateral scoots in bed    Ambulation / Gait / Stairs / Wheelchair Mobility  Ambulation/Gait General Gait Details: unable at this time    Posture / Balance Dynamic Sitting Balance Sitting balance - Comments: Pt pulling self down to laying position to R in bed while in sitting.  Weight bearing tasks initiated with LUE but pt unable to to tend to ask at hand to teach about weight bearing at this time. Balance  Overall balance assessment: Needs assistance Sitting-balance support: Feet supported, Single extremity supported Sitting balance-Leahy Scale: Poor Sitting balance - Comments: Pt pulling self down to laying position to R in bed while in sitting.  Weight bearing tasks initiated with LUE but pt unable to to tend to ask at hand to teach about weight bearing at this time. Postural control: Right lateral lean, Other (comment) (wanting to lay down in bed to her right) Standing balance support: Bilateral upper extremity supported Standing balance-Leahy Scale: Poor Standing balance comment: max A +2 to stand    Special needs/care consideration Hgb A1c 8.0 Palliative for goals of care on acute DNR Advanced directives completed on acute as well as MOST forms   Previous Home Environment  Living Arrangements: Spouse/significant other  Lives With: Spouse Available Help at Discharge: Family, Available 24 hours/day Type of Home: Mobile home Home Layout: One level Home Access: Stairs to enter Entrance Stairs-Rails: Right, Left Entrance Stairs-Number of Steps: 5 Bathroom Shower/Tub: Multimedia programmer: Programmer, systems: Yes Home Care Services: No Additional Comments: post op shoe for L foot s/p toe amputation  Discharge Living Setting Plans for Discharge Living Setting:  Patient's home Type of Home at Discharge: Mobile home Discharge Home Layout: One level Discharge Home Access: Stairs to enter Entrance Stairs-Rails: Right, Left Entrance Stairs-Number of Steps: 5 Discharge Bathroom Shower/Tub: Walk-in shower Discharge Bathroom Toilet: Standard Discharge Bathroom Accessibility: Yes Does the patient have any problems obtaining your medications?: No  Social/Family/Support Systems Patient Roles: Spouse, Parent Anticipated Caregiver: Husband, [name] Anticipated Ambulance person Information: [phone] Ability/Limitations of Caregiver: none Caregiver Availability: 24/7 Discharge Plan Discussed with Primary Caregiver: Yes Is Caregiver In Agreement with Plan?: Yes Does Caregiver/Family have Issues with Lodging/Transportation while Pt is in Rehab?: No  Goals Patient/Family Goal for Rehab: min A, PT, OT, SLP Expected length of stay: 14-21 days Pt/Family Agrees to Admission and willing to participate: Yes Program Orientation Provided & Reviewed with Pt/Caregiver Including Roles  & Responsibilities: Yes  Barriers to Discharge: Insurance for SNF coverage  Decrease burden of Care through IP rehab admission: n/a  Possible need for SNF placement upon discharge: not anticipated  Patient Condition: I have reviewed medical records from [facility], spoken with  TOC , and patient and spouse. I met with patient at the bedside for inpatient rehabilitation assessment.  Patient will benefit from ongoing PT, OT, and SLP, can actively participate in 3 hours of therapy a day 5 days of the week, and can make measurable gains during the admission.  Patient will also benefit from the coordinated team approach during an Inpatient Acute Rehabilitation admission.  The patient will receive intensive therapy as well as Rehabilitation physician, nursing, social worker, and care management interventions.  Due to safety, skin/wound care, disease management, medication administration,  pain management, and patient education the patient requires 24 hour a day rehabilitation nursing.  The patient is currently mod assist overall with mobility and basic ADLs.  Discharge setting and therapy post discharge at home with home health is anticipated.  Patient has agreed to participate in the Acute Inpatient Rehabilitation Program and will admit today.  Preadmission Screen Completed By:  Cleatrice Burke, 01/28/2022 10:28 AM ______________________________________________________________________   Discussed status with Dr. Tressa Busman on 01/28/22 at 1027 and received approval for admission today.  Admission Coordinator:  Cleatrice Burke, RN, time 1027 Date 01/28/22   Assessment/Plan: Diagnosis: Does the need for close, 24 hr/day Medical supervision in concert with the patient's rehab needs make it  unreasonable for this patient to be served in a less intensive setting? 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 Yes Does the patient require coordinated care of a physician, rehab nurse, PT, OT, and SLP to address physical and functional deficits in the context of the above medical diagnosis(es)? Yes Addressing deficits in the following areas: balance, endurance, locomotion, strength, transferring, bowel/bladder control, bathing, dressing, feeding, grooming, toileting, cognition, speech, and swallowing Can the patient actively participate in an intensive therapy program of at least 3 hrs of therapy 5 days a week? Yes The potential for patient to make measurable gains while on inpatient rehab is  good Anticipated functional outcomes upon discharge from inpatient rehab: min assist and mod assist PT, min assist and mod assist OT, supervision and min assist SLP Estimated rehab length of stay to reach the above functional goals is: 14-21 days Anticipated discharge destination: Home 10. Overall Rehab/Functional Prognosis: fair   MD Signature:  Gertie Gowda, DO 01/28/2022

## 2022-01-12 NOTE — Progress Notes (Addendum)
STROKE TEAM PROGRESS NOTE   INTERVAL HISTORY Husband at bedside and updated.  Patient is tachycardiac and tachypneic. She is still not speaking. Intermittently following commands, not as brisk as yesterday. Left leg moving to command today, left arm flaccid.   CBC:  Recent Labs  Lab 01/08/22 1326 01/08/22 1345 01/09/22 0503 01/10/22 0522 01/11/22 0532 01/12/22 0203  WBC 10.0   < > 26.5*   < > 17.3* 19.1*  NEUTROABS 7.3  --  26.0*  --   --   --   HGB 7.6*   < > 9.0*   < > 8.2* 8.4*  HCT 23.9*   < > 25.8*   < > 24.2* 24.5*  MCV 91.9   < > 89.0   < > 89.6 90.7  PLT 399   < > 327   < > 317 348   < > = values in this interval not displayed.    Basic Metabolic Panel:  Recent Labs  Lab 01/11/22 0532 01/11/22 1005 01/11/22 1556 01/12/22 0203  NA 141  --   --  142  K 3.5  --   --  3.7  CL 109  --   --  109  CO2 20*  --   --  22  GLUCOSE 82  --   --  272*  BUN 42*  --   --  55*  CREATININE 4.16*  --   --  3.58*  CALCIUM 8.1*  --   --  8.2*  MG 1.9   < > 1.9 2.2  PHOS 5.7*   < > 5.5* 4.9*   < > = values in this interval not displayed.    Lipid Panel:  Recent Labs  Lab 01/09/22 0503  CHOL 85  TRIG 154*  156*  HDL 15*  CHOLHDL 5.7  VLDL 31  LDLCALC 39    HgbA1c:  Recent Labs  Lab 01/09/22 1425  HGBA1C 8.0*    Urine Drug Screen:  Recent Labs  Lab 01/09/22 0225  LABOPIA NONE DETECTED  COCAINSCRNUR NONE DETECTED  LABBENZ NONE DETECTED  AMPHETMU NONE DETECTED  THCU NONE DETECTED  LABBARB NONE DETECTED     Alcohol Level  Recent Labs  Lab 01/08/22 1339  ETH <10     IMAGING past 24 hours DG Abd Portable 1V  Result Date: 01/12/2022 CLINICAL DATA:  Feeding tube placement. EXAM: PORTABLE ABDOMEN - 1 VIEW COMPARISON:  01/10/2022 FINDINGS: Cortrak feeding tube placement with the tip in the region of the distal stomach or proximal duodenum. Visualized bowel gas pattern is nonobstructive. IMPRESSION: Feeding tube tip in the region of the distal  stomach/proximal duodenum. Electronically Signed   By: Aletta Edouard M.D.   On: 01/12/2022 11:11   CT HEAD WO CONTRAST (5MM)  Result Date: 01/11/2022 CLINICAL DATA:  Follow-up stroke. Revascularization of cervical ICA occlusion. EXAM: CT HEAD WITHOUT CONTRAST TECHNIQUE: Contiguous axial images were obtained from the base of the skull through the vertex without intravenous contrast. RADIATION DOSE REDUCTION: This exam was performed according to the departmental dose-optimization program which includes automated exposure control, adjustment of the mA and/or kV according to patient size and/or use of iterative reconstruction technique. COMPARISON:  CT and MRI studies most recently 01/09/2022 FINDINGS: Brain: No abnormality seen affecting the brainstem or cerebellum. Left cerebral hemisphere appears normal by CT. Multiple widely scattered foci of low density present within the right cerebral hemisphere primarily throughout the right middle cerebral artery territory consistent with innumerable small infarctions. No large regional  confluent infarction. Mild generalized swelling. No hemorrhage. No midline shift. Vascular: Right ICA stent in place. Vascular calcification premature for age. Skull: Normal Sinuses/Orbits: Inflammatory changes of the paranasal sinuses. Orbits negative. Other: None IMPRESSION: Patchy low-density throughout the right middle cerebral artery territory consistent with the innumerable small infarction shown by MRI. No large confluent infarction. No evidence of hemorrhage or mass effect. Electronically Signed   By: Nelson Chimes M.D.   On: 01/11/2022 16:15    PHYSICAL EXAM  Temp:  [98.1 F (36.7 C)-99 F (37.2 C)] 98.1 F (36.7 C) (09/12 0800) Pulse Rate:  [88-114] 89 (09/12 1130) Resp:  [14-31] 25 (09/12 1130) BP: (103-184)/(58-112) 150/90 (09/12 1100) SpO2:  [92 %-100 %] 96 % (09/12 1130) FiO2 (%):  [30 %-36 %] 36 % (09/12 0936) Weight:  [94 kg] 94 kg (09/12 0500)  General -  Critically ill female. Well nourished, well developed, in no apparent distress. Cardiovascular - Regular rhythm and rate.  Mental Status -  Not speaking, intermittently Follows commands on the right. Mimicking examiner. Right gaze preference, does not cross midline, left facial droop, tongue midline. PERRL, very sluggish bilaterally, No blink to threat on left.  Motor Strength - Left arm is flaccid. Left leg withdraws to painful stimuli. Grimaces to noxious stimuli on left. Right side moves spontaneously and purposeful. Motor Tone - Muscle tone was assessed at the neck and appendages and was normal.   Sensory - appears to be intact to noxious stimuli  Coordination - Unable to assess  Gait and Station - deferred.   ASSESSMENT/PLAN Lauren [name] is a 41 y.o. 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 NIHSS of 8. Patient not a TPA candidate, underwent IR thrombectomy of R ICA  with R ICA dissection repair and stent placement with TICI 3  Stroke:  Acute Right MCA and vertebral stroke s/p thrombectomy with TICI 3 with right ICA dissection repair and stent placement  Etiology:  unclear, likely cardioembolic  Code Stroke CT head 1. Periventricular white matter hypodensities adjacent to the frontal horn of the right lateral ventricles and in the right corona radiata are new since prior exam. This may represent acute ischemia. 2. No acute or focal cortical abnormality. 3. ASPECTS is 10/10 CTA head & neck with CT perfusion  1. Age indeterminate occlusion of the proximal right ICA  in the neck with non opacification distally in the upper neck and intracranially. Right MCA and A1 ACA are patent, but diminutive. 2. Small/non dominant right vertebral artery, which is irregular throughout its V2 segment with age indeterminate occlusion at the V3 segment with non opacification of the proximal intradural vertebral artery and reconstitution of the distal intradural vertebral artery. 3. Severe left paraclinoid ICA stenosis. 4. One CT perfusion, no evidence of core infarct or penumbra; however, there is approximately 38 mL of T-max greater than 4 seconds in the right MCA territory which suggests possible oligemia. An MRI could provide more sensitive evaluation for acute infarct. 5. Moderate layering bilateral pleural effusions and peribronchial wall thickening. Recommend dedicated chest imaging  Cerebral angio  Occluded right internal carotid artery  extracranially and intracranially to the supraclinoid region Status post endovascular complete revascularization of occluded right internal carotid artery proximally and distally intracranially with 1 pass with a 4 mm x 40 mm solitary extragenital device and proximal aspiration, 1 pass with a 5 mm x 37 mm amber trap retrieval device and aspiration proximally, and 1 pass with a 6.5 mm x 45 mm ambulatory with proximal aspiration achieving a complete revascularization of the internal carotid artery.   Status post reconstruction of symptomatic occlusive long segment dissection extending from the proximal one third of the right internal carotid artery to the petrous cavernous junction using 3 pipeline flow diverter's and a 4 mm x 24 mm Neuroform Atlas stent in a  telescope fashion.  Final arteriogram from the right common carotid artery demonstrates complete revascularization without any filling defects with opacification of the right middle cerebral artery distribution maintaining aTICI 3 revascularization.  Post IR CT  1. Redemonstrated  hypodensity in the right frontal periventricular white matter. No additional acute intracranial process. No additional acute infarct or hemorrhage. 2. Air-fluid levels with bubbly fluid in the maxillary sinuses and right sphenoid sinus are likely related to intubation but can be seen in the setting of sinusitis  MRI   1. Extensive patchy areas of acute nonhemorrhagic infarction involving the right MCA territory, most confluent areas along the watershed. 2. Focal nonhemorrhagic infarct involving the genu of the corpus callosum and more posteriorly along the body of the corpus callosum on the right. 3. Diffuse sinus disease is likely secondary to intubation MRA   1. Abnormal flow signal within the petrous and pre cavernous right ICA may represent artifact from stent or slow flow 2. Signal loss in the supraclinoid left ICA may be artifactual. 3. Asymmetric diffused decreased size of right MCA branch vessels compared to the left likely reflects decreased perfusion. No significant proximal stenosis or occlusion  2D Echo EF 30-35%, akinesis of mid to apical septal LV segments, all apical segments and apex.  LDL 39 HgbA1c 9.6 VTE prophylaxis - SCD's    Diet   Diet NPO time specified   Eliquis (apixaban) daily prior to admission, now on aspirin 81 mg daily and Brilinta (ticagrelor) 90 mg bid.  Therapy recommendations:  pending Disposition:  pending  Hypertension Home meds:  hydralazine, metoprolol, isosorbide Stable Long-term BP goal normotensive  Hyperlipidemia Home meds:  fenofibrate, not resumed in hospital LDL 39, goal < 70 Started on atorvastatin. Continue at discharge   Diabetes type II UnControlled Metabolic Acidosis  DKA Home meds:  insulin HgbA1c 9.6, goal < 7.0 CBGs Recent Labs    01/11/22 2349 01/12/22 0349 01/12/22 0806  GLUCAP 310* 238* 220*     insulin gtt currently off Correct electrolyte derangements SSI Needs close follow up with PCP to help manage  DM   Other Stroke Risk Factors  Former Cigarette smoker advised to stop smoking Obesity, Body mass index is 34.49 kg/m., BMI >/= 30 associated with increased stroke risk, recommend weight loss, diet and exercise as appropriate  Hx stroke/TIA Coronary artery disease Congestive heart failure  Other Active Problems Acute hypoxic respiratory Failure, resolved  managed by CCM Off prop Extubated 9/10 On bipap- CCM to trial off today  Chronic Combined HF EF 30-35%, decreased LV function, bubble study negative Meds on hold currently  Hypothyroidism  Home synthroid   AKI on CKD  IVF  Worsening Cr 2.50-3.07-3.63-3.80-4.17-4.54-4.16  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 MD to update note as needed.   Janine Ores, DNP, FNP-BC [facility] Pager: [phone]  ATTENDING ATTESTATION:  41 year old status post thrombectomy for 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 On  aspirin and Brilinta.  She is on heparin drip for elevated troponin possible coronary syndrome.  Being followed by cardiology.  She is now on BiPAP.  She is still following commands and moving her right arm and leg.  She was able to move her left foot today which is new.  She is not able to form any words.  She does not stick her tongue out for me.  She can nod yes/no to questions.  She would likely need another MRI to see if she is had extension of her stroke or possibly new stroke however we are unable to do that the time with BiPAP.   Appreciate CCM assistance.      Dr. Reeves Forth evaluated pt independently, reviewed imaging, chart, labs. Discussed and formulated plan with the APP. Please see APP note above for  details.     This patient is critically ill due to respiratory distress, possible sepsis/osteo, and  stroke s/p thrombectomy and at significant risk of neurological worsening, death form heart failure, respiratory failure, recurrent stroke, bleeding from [facility], seizure, sepsis. This patient's care requires constant monitoring of vital signs, hemodynamics, respiratory and cardiac monitoring, review of multiple databases, neurological assessment, discussion with family, other specialists and medical decision making of high complexity. I spent 35 minutes of neurocritical care time in the care of this patient.    Allysia Ingles,MD

## 2022-01-12 NOTE — Procedures (Signed)
Cortrak  Person Inserting Tube:  Lauren [name], RD Tube Type:  Cortrak - 43 inches Tube Size:  10 Tube Location:  Right nare Secured by: Bridle Technique Used to Measure Tube Placement:  Marking at nare/corner of mouth Cortrak Secured At:  78 cm Procedure Comments:  Cortrak Tube Team Note:  Consult received to place a Cortrak feeding tube.   X-ray is required, abdominal x-ray has been ordered by the Cortrak team. Please confirm tube placement before using the Cortrak tube.   If the tube becomes dislodged please keep the tube and contact the Cortrak team at [email or web address] (password [email or web address]) for replacement.  If after hours and replacement cannot be delayed, place a NG tube and confirm placement with an abdominal x-ray.   Lauren Passey MS, RDN, LDN, CNSC Registered Dietitian 3 Clinical Nutrition RD Pager and On-Call Pager Number Located in [place]

## 2022-01-12 NOTE — Progress Notes (Signed)
Inpatient Rehab Coordinator Note:  I met with patient/husband at bedside to discuss CIR recommendations and goals/expectations of CIR stay.  We reviewed 3 hrs/day of therapy, physician follow up, and average length of stay 2 weeks (dependent upon progress) with goals of min A.  Patient and spouse in agreement and would like to pursue potential admission to CIR when patient is medically ready. Spouse reports he and other family members will be available to patient 24/7. We will continue to follow.   Rehab Admissons Coordinator [place], [place], [place] [phone]

## 2022-01-12 NOTE — Progress Notes (Signed)
[facility] Progress Note   Subjective:   Seen at bedside this AM, husband present.  CP last PM - cards eval, heparin gtt started.  Denies this AM.  I/Os yest 0.750 / 2750 with lasix  Objective Vitals:   01/12/22 0729 01/12/22 0800 01/12/22 0900 01/12/22 0936  BP:  128/87 122/86   Pulse: 92 96 100   Resp: 15 16 (!) 21   Temp:  98.1 F (36.7 C)    TempSrc:  Axillary    SpO2: 98% 98% 96% 97%  Weight:      Height:       Physical Exam General: lying at 30 degrees Heart: RRR Lungs: clear Abdomen:soft, obese Extremities: no edema Neuro:  will localize some by L side not moving, not really following commands or interacting on my exam  Additional Objective Labs: Basic Metabolic Panel: Recent Labs  Lab 01/10/22 1757 01/11/22 0532 01/11/22 1005 01/11/22 1556 01/12/22 0203  NA 141 141  --   --  142  K 3.9 3.5  --   --  3.7  CL 108 109  --   --  109  CO2 19* 20*  --   --  22  GLUCOSE 175* 82  --   --  272*  BUN 42* 42*  --   --  55*  CREATININE 4.54* 4.16*  --   --  3.58*  CALCIUM 7.6* 8.1*  --   --  8.2*  PHOS  --  5.7* 6.0* 5.5* 4.9*    Liver Function Tests: Recent Labs  Lab 01/08/22 1326 01/08/22 2014  AST 18 34  ALT 14 19  ALKPHOS 73 67  BILITOT 0.4 0.5  PROT 5.6* 4.4*  ALBUMIN 2.6* 2.1*    No results for input(s): "LIPASE", "AMYLASE" in the last 168 hours. CBC: Recent Labs  Lab 01/08/22 1326 01/08/22 1345 01/09/22 0503 01/10/22 0522 01/10/22 1509 01/11/22 0532 01/12/22 0203  WBC 10.0   < > 26.5* 19.4* 21.5* 17.3* 19.1*  NEUTROABS 7.3  --  26.0*  --   --   --   --   HGB 7.6*   < > 9.0* 7.3* 8.4* 8.2* 8.4*  HCT 23.9*   < > 25.8* 21.6* 25.0* 24.2* 24.5*  MCV 91.9   < > 89.0 89.6 91.6 89.6 90.7  PLT 399   < > 327 315 293 317 348   < > = values in this interval not displayed.    Blood Culture    Component Value Date/Time   SDES TRACHEAL ASPIRATE 01/09/2022 0408   SPECREQUEST NONE 01/09/2022 0408   CULT  01/09/2022 0408    RARE  Normal respiratory flora-no Staph aureus or Pseudomonas seen Performed at [facility], [place] [address] [place], [place] [address]    REPTSTATUS 01/11/2022 FINAL 01/09/2022 0408    Cardiac Enzymes: No results for input(s): "CKTOTAL", "CKMB", "CKMBINDEX", "TROPONINI" in the last 168 hours. CBG: Recent Labs  Lab 01/11/22 1618 01/11/22 2000 01/11/22 2349 01/12/22 0349 01/12/22 0806  GLUCAP 181* 282* 310* 238* 220*    Iron Studies: No results for input(s): "IRON", "TIBC", "TRANSFERRIN", "FERRITIN" in the last 72 hours. @lablastinr3 @ Studies/Results: CT HEAD WO CONTRAST (5MM)  Result Date: 01/11/2022 CLINICAL DATA:  Follow-up stroke. Revascularization of cervical ICA occlusion. EXAM: CT HEAD WITHOUT CONTRAST TECHNIQUE: Contiguous axial images were obtained from the base of the skull through the vertex without intravenous contrast. RADIATION DOSE REDUCTION: This exam was performed according to the departmental dose-optimization program which  includes automated exposure control, adjustment of the mA and/or kV according to patient size and/or use of iterative reconstruction technique. COMPARISON:  CT and MRI studies most recently 01/09/2022 FINDINGS: Brain: No abnormality seen affecting the brainstem or cerebellum. Left cerebral hemisphere appears normal by CT. Multiple widely scattered foci of low density present within the right cerebral hemisphere primarily throughout the right middle cerebral artery territory consistent with innumerable small infarctions. No large regional confluent infarction. Mild generalized swelling. No hemorrhage. No midline shift. Vascular: Right ICA stent in place. Vascular calcification premature for age. Skull: Normal Sinuses/Orbits: Inflammatory changes of the paranasal sinuses. Orbits negative. Other: None IMPRESSION: Patchy low-density throughout the right middle cerebral artery territory consistent with the innumerable small infarction shown by MRI. No large  confluent infarction. No evidence of hemorrhage or mass effect. Electronically Signed   By: Nelson Chimes M.D.   On: 01/11/2022 16:15   DG CHEST PORT 1 VIEW  Result Date: 01/11/2022 CLINICAL DATA:  Acute respiratory distress EXAM: PORTABLE CHEST 1 VIEW COMPARISON:  01/09/2022 FINDINGS: No significant change in AP portable chest radiograph. Cardiomegaly with mild, diffuse bilateral interstitial opacity and probable small, layering pleural effusions. No new or focal airspace opacity. Endotracheal tube tip position is generally obscured by overlying esophagogastric tube but appears appropriate over the midtrachea. Esophagogastric tube with tip and side port below the diaphragm. IMPRESSION: 1. No significant change in AP portable chest radiograph. Cardiomegaly with mild, diffuse bilateral interstitial opacity and probable small, layering pleural effusions most likely edema. No new or focal airspace opacity. 2. Endotracheal tube tip position is generally obscured by overlying esophagogastric tube but appears appropriate over the midtrachea. Electronically Signed   By: Delanna Ahmadi M.D.   On: 01/11/2022 11:19   DG Abd Portable 1V  Result Date: 01/10/2022 CLINICAL DATA:  NG tube placement EXAM: PORTABLE ABDOMEN - 1 VIEW COMPARISON:  None Available. FINDINGS: Enteric tube terminates in the gastric antrum. Nonobstructive bowel gas pattern. Cholecystectomy clips. IMPRESSION: Enteric tube terminates in the gastric antrum. Electronically Signed   By: Julian Hy M.D.   On: 01/10/2022 19:06   US RENAL  Result Date: 01/10/2022 CLINICAL DATA:  Renal dysfunction EXAM: RENAL / URINARY TRACT ULTRASOUND COMPLETE COMPARISON:  01/01/2020 FINDINGS: Right Kidney: Renal measurements: 9.2 x 4.8 x 6.1 cm = volume: 141.7 mL. There is no hydronephrosis. There is increased cortical echogenicity. Left Kidney: Renal measurements: Lennie Odor by 6.2 x 4.8 cm = volume: 171.8 mL. There is no hydronephrosis. There is increased cortical  echogenicity. Bladder: Foley catheter is seen in the bladder. Urinary bladder is not distended and not adequately evaluated. Other: None. IMPRESSION: There is no hydronephrosis. Increased cortical echogenicity in the kidneys suggests medical renal disease. Electronically Signed   By: Elmer Picker M.D.   On: 01/10/2022 15:17   ECHOCARDIOGRAM COMPLETE BUBBLE STUDY  Result Date: 01/10/2022    ECHOCARDIOGRAM REPORT   Patient Name:   ATTICUS LEMBERGER Date of Exam: 01/10/2022 Medical Rec #:  [id number]       Height:       65.0 in Accession #:    [id number]      Weight:       209.0 lb Date of Birth:  May 22, 1980       BSA:          2.015 m Patient Age:    26 years        BP:           121/65 mmHg Patient Gender: F  HR:           107 bpm. Exam Location:  Inpatient Procedure: 2D Echo, Color Doppler, Cardiac Doppler and Saline Contrast Bubble            Study Indications:     Stroke  History:         Patient has prior history of Echocardiogram examinations, most                  recent 11/19/2021. CHF, CAD, Stroke; Risk Factors:Diabetes.  Sonographer:     Memory Argue Sonographer#2:   Johny Chess RDCS Referring Phys:  [id number] Lorenza Chick Diagnosing Phys: Gwyndolyn Kaufman MD IMPRESSIONS  1. There is akinesis of the mid-to-apical septal LV segments, all apical segments, and apex. There is significant thinning of the mid-to-apical septal segments and apex likely represents scarring/infarct. The basal-to-mid inferior and basal septal segments are hypokinetic.  2. No LV thrombus visualized on definity imaging.  3. Left ventricular ejection fraction, by estimation, is 30 to 35%. The left ventricle has moderately decreased function. The left ventricle demonstrates regional wall motion abnormalities (see scoring diagram/findings for description). The left ventricular internal cavity size was mildly-to-moderately dilated. There is mild concentric left ventricular hypertrophy. Indeterminate diastolic  filling due to E-A fusion.  4. Right ventricular systolic function is normal. The right ventricular size is normal.  5. The mitral valve is normal in structure. Trivial mitral valve regurgitation.  6. The aortic valve is tricuspid. Aortic valve regurgitation is not visualized. No aortic stenosis is present.  7. The inferior vena cava is dilated in size with >50% respiratory variability, suggesting right atrial pressure of 8 mmHg.  8. Agitated saline contrast bubble study was negative, with no evidence of any interatrial shunt. Comparison(s): Compared to prior TTE on 10/2021, the EF has dropped to 30-35% with similar wall motion. FINDINGS  Left Ventricle: There is akinesis of the mid-to-apical septal LV segments, all apical segments, and apex. There is significant thinning of the mid-to-apical septal segments and apex which likely represents scarring/infarct. The basal-to-mid inferior and  basal septal segments are hypokinetic. No LV thrombus visualized. Left ventricular ejection fraction, by estimation, is 30 to 35%. The left ventricle has moderately decreased function. The left ventricle demonstrates regional wall motion abnormalities. Definity contrast agent was given IV to delineate the left ventricular endocardial borders. The left ventricular internal cavity size was mildly to moderately dilated. There is mild concentric left ventricular hypertrophy. Indeterminate diastolic filling  due to E-A fusion. Right Ventricle: The right ventricular size is normal. No increase in right ventricular wall thickness. Right ventricular systolic function is normal. Left Atrium: Left atrial size was normal in size. Right Atrium: Right atrial size was normal in size. Pericardium: There is no evidence of pericardial effusion. Mitral Valve: The mitral valve is normal in structure. Trivial mitral valve regurgitation. Tricuspid Valve: The tricuspid valve is normal in structure. Tricuspid valve regurgitation is trivial. Aortic Valve:  The aortic valve is tricuspid. Aortic valve regurgitation is not visualized. No aortic stenosis is present. Aortic valve mean gradient measures 4.0 mmHg. Aortic valve peak gradient measures 7.4 mmHg. Aortic valve area, by VTI measures 2.86 cm. Pulmonic Valve: The pulmonic valve was normal in structure. Pulmonic valve regurgitation is trivial. Aorta: The aortic root is normal in size and structure. Venous: The inferior vena cava is dilated in size with greater than 50% respiratory variability, suggesting right atrial pressure of 8 mmHg. IAS/Shunts: The atrial septum is grossly normal. Agitated saline contrast  was given intravenously to evaluate for intracardiac shunting. Agitated saline contrast bubble study was negative, with no evidence of any interatrial shunt.  LEFT VENTRICLE PLAX 2D LVIDd:         4.80 cm LVIDs:         3.40 cm LV PW:         1.00 cm LV IVS:        1.10 cm LVOT diam:     2.00 cm LV SV:         57 LV SV Index:   29 LVOT Area:     3.14 cm  LV Volumes (MOD) LV vol d, MOD A2C: 170.0 ml LV vol d, MOD A4C: 112.0 ml LV vol s, MOD A2C: 108.0 ml LV vol s, MOD A4C: 61.0 ml LV SV MOD A2C:     62.0 ml LV SV MOD A4C:     112.0 ml LV SV MOD BP:      56.6 ml RIGHT VENTRICLE RV S prime:     14.10 cm/s LEFT ATRIUM             Index        RIGHT ATRIUM           Index LA diam:        4.00 cm 1.98 cm/m   RA Area:     10.40 cm LA Vol (A2C):   60.2 ml 29.87 ml/m  RA Volume:   18.80 ml  9.33 ml/m LA Vol (A4C):   43.2 ml 21.43 ml/m LA Biplane Vol: 51.0 ml 25.30 ml/m  AORTIC VALVE AV Area (Vmax):    2.82 cm AV Area (Vmean):   2.71 cm AV Area (VTI):     2.86 cm AV Vmax:           136.00 cm/s AV Vmean:          94.500 cm/s AV VTI:            0.201 m AV Peak Grad:      7.4 mmHg AV Mean Grad:      4.0 mmHg LVOT Vmax:         122.00 cm/s LVOT Vmean:        81.600 cm/s LVOT VTI:          0.183 m LVOT/AV VTI ratio: 0.91  AORTA Ao Root diam: 2.90 cm MR Peak grad: 53.3 mmHg MR Vmax:      365.00 cm/s SHUNTS                            Systemic VTI:  0.18 m                           Systemic Diam: 2.00 cm Gwyndolyn Kaufman MD Electronically signed by Gwyndolyn Kaufman MD Signature Date/Time: 01/10/2022/2:00:22 PM    Final (Updated)    Medications:  heparin 1,000 Units/hr (01/12/22 0900)   nitroGLYCERIN 15 mcg/min (01/12/22 0900)    amoxicillin-clavulanate  1 tablet Per Tube Q12H   arformoterol  15 mcg Nebulization BID   aspirin  81 mg Oral Daily   Or   aspirin  81 mg Per Tube Daily   atorvastatin  40 mg Per Tube Daily   budesonide (PULMICORT) nebulizer solution  0.5 mg Nebulization BID   Chlorhexidine Gluconate Cloth  6 each Topical Daily   docusate  100 mg Per Tube BID   feeding  supplement (PROSource TF20)  60 mL Per Tube Daily   feeding supplement (VITAL HIGH PROTEIN)  1,000 mL Per Tube Q24H   insulin aspart  0-20 Units Subcutaneous Q4H   insulin aspart  2 Units Subcutaneous Q4H   insulin glargine-yfgn  30 Units Subcutaneous Q24H   isosorbide mononitrate  15 mg Per Tube BID   levothyroxine  75 mcg Per Tube Q0600   methylPREDNISolone (SOLU-MEDROL) injection  40 mg Intravenous Daily   metoprolol tartrate  12.5 mg Per Tube BID   mupirocin ointment  1 Application Nasal BID   nicotine  21 mg Transdermal Daily   pantoprazole (PROTONIX) IV  40 mg Intravenous QHS   polyethylene glycol  17 g Per Tube Daily   revefenacin  175 mcg Nebulization Daily   ticagrelor  90 mg Oral BID   Or   ticagrelor  90 mg Per Tube BID   Assessment/ Plan: AKI on CKD IV - b/l creat 2.1- 2.3 from July 2023, eGFR 27-29 ml/min. Not sure who her renal MD is - poss Befakadu, does not look like she's seen recently.  Creat here was 2.0 on admission for acute CVA. Pt went for thrombectomy 9/8 per IR w/ stent placement. Pt got > 1 IV contrast doses on 9/8 for diagnostic CT scans. Cr peaked 9/10 at 4.5 and has been trending down, 3.6 this AM. BP's mostly normal, no sig hypotension. UA + proteinuria which is chronic, no hematuria. Renal US  c/w CKD, no obstruction.  AKI due to contrast nephropathy most likely.  UOP is adequate and responded to diuretic therapy.  No indications for dialysis. Will follow closely.   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 Improving.  Can redose lasix PRN - BUN trending up and was fairly net neg yesterday so hold for now.  DM2 - per pmd CAD hx of MI - CP overnight, on heparin gtt, cardiology following.  Will follow, page with concerns.   Jannifer Hick MD 01/12/2022, 9:54 AM  [facility] Pager: [phone]

## 2022-01-13 DIAGNOSIS — M86672 Other chronic osteomyelitis, left ankle and foot: Secondary | ICD-10-CM

## 2022-01-13 DIAGNOSIS — I2510 Atherosclerotic heart disease of native coronary artery without angina pectoris: Secondary | ICD-10-CM | POA: Diagnosis not present

## 2022-01-13 DIAGNOSIS — I255 Ischemic cardiomyopathy: Secondary | ICD-10-CM | POA: Diagnosis not present

## 2022-01-13 DIAGNOSIS — I6521 Occlusion and stenosis of right carotid artery: Secondary | ICD-10-CM | POA: Diagnosis not present

## 2022-01-13 LAB — APTT
aPTT: 53 seconds — ABNORMAL HIGH (ref 24–36)
aPTT: 48 seconds — ABNORMAL HIGH (ref 24–36)
aPTT: 67 seconds — ABNORMAL HIGH (ref 24–36)

## 2022-01-13 LAB — BASIC METABOLIC PANEL
Anion gap: 9 (ref 5–15)
BUN: 53 mg/dL — ABNORMAL HIGH (ref 6–20)
CO2: 28 mmol/L (ref 22–32)
Calcium: 8.5 mg/dL — ABNORMAL LOW (ref 8.9–10.3)
Chloride: 111 mmol/L (ref 98–111)
Creatinine, Ser: 2.73 mg/dL — ABNORMAL HIGH (ref 0.44–1.00)
GFR, Estimated: 22 mL/min — ABNORMAL LOW (ref >60)
Glucose, Bld: 81 mg/dL (ref 70–99)
Potassium: 3.5 mmol/L (ref 3.5–5.1)
Sodium: 148 mmol/L — ABNORMAL HIGH (ref 135–145)

## 2022-01-13 LAB — TROPONIN I (HIGH SENSITIVITY): Troponin I (High Sensitivity): 2880 ng/L (ref <18)

## 2022-01-13 LAB — CULTURE, BLOOD (ROUTINE X 2)
Culture: NO GROWTH
Culture: NO GROWTH
Special Requests: ADEQUATE
Special Requests: ADEQUATE

## 2022-01-13 LAB — HEPARIN LEVEL (UNFRACTIONATED): Heparin Unfractionated: 0.67 IU/mL (ref 0.30–0.70)

## 2022-01-13 LAB — CBC
HCT: 26.3 % — ABNORMAL LOW (ref 36.0–46.0)
Hemoglobin: 8.7 g/dL — ABNORMAL LOW (ref 12.0–15.0)
MCH: 30.6 pg (ref 26.0–34.0)
MCHC: 33.1 g/dL (ref 30.0–36.0)
MCV: 92.6 fL (ref 80.0–100.0)
Platelets: 353 10*3/uL (ref 150–400)
RBC: 2.84 MIL/uL — ABNORMAL LOW (ref 3.87–5.11)
RDW: 14.2 % (ref 11.5–15.5)
WBC: 22 10*3/uL — ABNORMAL HIGH (ref 4.0–10.5)
nRBC: 0 % (ref 0.0–0.2)

## 2022-01-13 LAB — GLUCOSE, CAPILLARY
Glucose-Capillary: 131 mg/dL — ABNORMAL HIGH (ref 70–99)
Glucose-Capillary: 201 mg/dL — ABNORMAL HIGH (ref 70–99)
Glucose-Capillary: 208 mg/dL — ABNORMAL HIGH (ref 70–99)
Glucose-Capillary: 210 mg/dL — ABNORMAL HIGH (ref 70–99)
Glucose-Capillary: 214 mg/dL — ABNORMAL HIGH (ref 70–99)
Glucose-Capillary: 88 mg/dL (ref 70–99)

## 2022-01-13 LAB — MAGNESIUM: Magnesium: 2.4 mg/dL (ref 1.7–2.4)

## 2022-01-13 LAB — PHOSPHORUS: Phosphorus: 3.7 mg/dL (ref 2.5–4.6)

## 2022-01-13 MED ORDER — METOPROLOL TARTRATE 25 MG PO TABS
25.0000 mg | ORAL_TABLET | Freq: Two times a day (BID) | ORAL | Status: DC
  Administered 2022-01-13 – 2022-01-21 (×13): 25 mg
  Filled 2022-01-13 (×15): qty 1

## 2022-01-13 MED ORDER — POTASSIUM CHLORIDE 20 MEQ PO PACK
40.0000 meq | PACK | Freq: Once | ORAL | Status: AC
  Administered 2022-01-13: 40 meq
  Filled 2022-01-13: qty 2

## 2022-01-13 MED ORDER — FREE WATER
200.0000 mL | Freq: Four times a day (QID) | Status: DC
  Administered 2022-01-13 – 2022-01-14 (×5): 200 mL

## 2022-01-13 MED ORDER — MORPHINE SULFATE (PF) 2 MG/ML IV SOLN
1.0000 mg | Freq: Once | INTRAVENOUS | Status: AC
  Administered 2022-01-13: 1 mg via INTRAVENOUS
  Filled 2022-01-13: qty 1

## 2022-01-13 MED ORDER — POTASSIUM CHLORIDE 20 MEQ PO PACK: 40.0000 meq | PACK | Freq: Once | ORAL | Status: DC

## 2022-01-13 MED ORDER — DIAZEPAM 5 MG/ML IJ SOLN
5.0000 mg | INTRAMUSCULAR | Status: AC | PRN
  Administered 2022-01-14: 5 mg via INTRAVENOUS
  Filled 2022-01-13: qty 2

## 2022-01-13 NOTE — Progress Notes (Signed)
Inpatient Diabetes Program Recommendations  AACE/ADA: New Consensus Statement on Inpatient Glycemic Control (2015)  Target Ranges:  Prepandial:   less than 140 mg/dL      Peak postprandial:   less than 180 mg/dL (1-2 hours)      Critically ill patients:  140 - 180 mg/dL   Lab Results  Component Value Date   GLUCAP 131 (H) 01/13/2022   HGBA1C 8.0 (H) 01/09/2022    Review of Glycemic Control  Latest Reference Range & Units 01/12/22 08:06 01/12/22 11:45 01/12/22 16:04 01/12/22 19:55 01/12/22 23:50 01/13/22 04:16 01/13/22 07:57  Glucose-Capillary 70 - 99 mg/dL 220 (H) 203 (H) 227 (H) 225 (H) 201 (H) 88 131 (H)   Diabetes history: DM 1 Outpatient Diabetes medications:  Omnipod insulin pump (unsure of settings) (Prior to omnipod patient was taking Semglee 34 units daily and Novolog 4-10 units with meals) Sees Rayetta Pigg, NP in [place], [place] Current orders for Inpatient glycemic control:  Novolog 2 units q 4 hours (added today) Novolog 0-20 units q 4 hours Semglee 30 units daily Solumedrol 40 mg daily (for 5 doses) Glucerna at 55 ml/hr    Note that patient has history of Type 1 DM.   A general diabetes diagnosis was noted in documentation with pt on presentation without noting the type of diabetes Documentation later listed the type of Diabetes as Type 2 and pt was ordered SSI Noted SSI was not received. Unsure if orders released due to pt being in a procedural area. Insulin pump listed on home med list Pt had Omnipod insulin pump with Dexcom CGM prior to coming in the ED. Per husband the pump and CGM was taken off prior to coming in the ED for evaluation.   Glucose on presentation 235 at 1319 pm Next glucose check after procedure at 2038 pm and it was 427.  No insulin given until after 427 glucose reading at 2116 pm IV insulin ordered 9/8 at 2116 SQ insulin for transition ordered on 9/10 at [facility]   Thanks, Tama Headings RN, MSN, BC-ADM Inpatient Diabetes  Coordinator Team Pager [phone] (8a-5p)

## 2022-01-13 NOTE — Progress Notes (Signed)
[facility] for heparin Indication: chest pain/ACS  Allergies  Allergen Reactions   Wellbutrin [Bupropion] Hives   Cefepime Rash    Tolerates penicilllin   Ciprofloxacin Hcl Hives and Rash    Hives/rash at injection site    Tape Rash    Patient Measurements: Height: 5\' 5"  (165.1 cm) Weight: 89.4 kg (197 lb 1.5 oz) IBW/kg (Calculated) : 57 Heparin Dosing Weight: 77kg  Vital Signs: Temp: 99.7 F (37.6 C) (09/13 2000) Temp Source: Axillary (09/13 2000) BP: 137/92 (09/13 1524) Pulse Rate: 97 (09/13 1524)  Labs: Recent Labs    01/11/22 0532 01/11/22 1001 01/12/22 0027 01/12/22 0203 01/12/22 0541 01/12/22 1702 01/13/22 0429 01/13/22 0827 01/13/22 1059 01/13/22 2054  HGB 8.2*  --   --  8.4*  --   --  8.7*  --   --   --   HCT 24.2*  --   --  24.5*  --   --  26.3*  --   --   --   PLT 317  --   --  348  --   --  353  --   --   --   APTT  --   --   --   --  45*   < >  --  53* 48* 67*  HEPARINUNFRC  --   --   --   --  0.88*  --   --  0.67  --   --   CREATININE 4.16*  --   --  3.58*  --   --  2.73*  --   --   --   TROPONINIHS  --    < > 3,807* 3,512*  --   --   --   --  2,880*  --    < > = values in this interval not displayed.     Estimated Creatinine Clearance: 30.6 mL/min (A) (by C-G formula based on SCr of 2.73 mg/dL (H)).   Assessment: 52 yoF admitted with R MCA vertebral stroke s/p thrombectomy and stent placement. Pt is on apixaban prior to admit, no anticoagulation received this admission. Pt with elevated troponins and now chest pain, pharmacy asked to dose IV heparin for ACS management. Will defer bolus and utilize lower goal in setting of recent CVA. Will check aPTT along with initial heparin level as apixaban may still be affecting level  aPTT now therapeutic (67 sec) on heparin 1400 units/hr. Heparin running in PIV and aPTT from central line.  Goal of Therapy:  Heparin level 0.3-0.5 units/ml aPTT 66-85 seconds Monitor  platelets by anticoagulation protocol: Yes   Plan:  Continue heparin to 1400 units/h F/u a.m. aPTT and heparin level to confirm therapeutic  Sherlon Handing, PharmD, BCPS Please see amion for complete clinical pharmacist phone list 01/13/2022 10:14 PM

## 2022-01-13 NOTE — Progress Notes (Addendum)
Rounding Note    Patient Name: Lauren [name] Date of Encounter: 01/13/2022  Anon Raices Cardiologist: Carlyle Dolly, MD   Subjective   Patient seen and examined at her bedside.  Inpatient Medications    Scheduled Meds:  amoxicillin-clavulanate  1 tablet Per Tube Q12H   arformoterol  15 mcg Nebulization BID   aspirin  81 mg Oral Daily   Or   aspirin  81 mg Per Tube Daily   atorvastatin  40 mg Per Tube Daily   budesonide (PULMICORT) nebulizer solution  0.5 mg Nebulization BID   Chlorhexidine Gluconate Cloth  6 each Topical Daily   docusate  100 mg Per Tube BID   feeding supplement (PROSource TF20)  60 mL Per Tube Daily   free water  200 mL Per Tube Q6H   insulin aspart  0-20 Units Subcutaneous Q4H   insulin aspart  2 Units Subcutaneous Q4H   insulin glargine-yfgn  30 Units Subcutaneous Q24H   isosorbide mononitrate  20 mg Per Tube BID   levothyroxine  75 mcg Per Tube Q0600   methylPREDNISolone (SOLU-MEDROL) injection  40 mg Intravenous Daily   metoprolol tartrate  12.5 mg Per Tube BID    morphine injection  1 mg Intravenous Once   nicotine  21 mg Transdermal Daily   pantoprazole  40 mg Per Tube Daily   polyethylene glycol  17 g Per Tube Daily   revefenacin  175 mcg Nebulization Daily   ticagrelor  90 mg Oral BID   Or   ticagrelor  90 mg Per Tube BID   Continuous Infusions:  feeding supplement (GLUCERNA 1.5 CAL) 55 mL/hr at 01/13/22 1100   heparin 1,250 Units/hr (01/13/22 1100)   nitroGLYCERIN Stopped (01/12/22 1414)   PRN Meds: acetaminophen **OR** acetaminophen (TYLENOL) oral liquid 160 mg/5 mL **OR** acetaminophen, diazepam, docusate sodium, labetalol, mouth rinse, polyethylene glycol, senna-docusate   Vital Signs    Vitals:   01/13/22 0900 01/13/22 0908 01/13/22 1000 01/13/22 1100  BP: (!) 166/114  (!) 161/96 (!) 151/89  Pulse: 98 100 100 (!) 103  Resp: (!) 25  (!) 31 (!) 46  Temp:      TempSrc:      SpO2: 94%  94% 92%  Weight:  89.4 kg     Height:  5\' 5"  (1.651 m)      Intake/Output Summary (Last 24 hours) at 01/13/2022 1138 Last data filed at 01/13/2022 1100 Gross per 24 hour  Intake 1350.67 ml  Output 2200 ml  Net -849.33 ml      01/13/2022    9:08 AM 01/13/2022    5:00 AM 01/12/2022    5:00 AM  Last 3 Weights  Weight (lbs) 197 lb 1.5 oz 197 lb 1.5 oz 207 lb 3.7 oz  Weight (kg) 89.4 kg 89.4 kg 94 kg      Telemetry    Sinus rhythm - Personally Reviewed  ECG    Sinus rhythm, with occasional PVC new but other findings are not new - Personally Reviewed  Physical Exam   GEN: No acute distress.   Neck: No JVD Cardiac: RRR, no murmurs, rubs, or gallops.  Respiratory: Clear to auscultation bilaterally. GI: Soft, nontender, non-distended  MS: No edema; No deformity. Neuro:  Nonfocal  Psych: Normal affect   Labs    High Sensitivity Troponin:   Recent Labs  Lab 01/08/22 1326 01/08/22 1920 01/11/22 1001 01/12/22 0027 01/12/22 0203  TROPONINIHS 1,304* 860* 3,869* 3,807* 3,512*     Chemistry Recent  Labs  Lab 01/08/22 1326 01/08/22 1345 01/08/22 2014 01/08/22 2049 01/11/22 0532 01/11/22 1005 01/12/22 0203 01/12/22 1615 01/13/22 0429  NA 137   < > 135   < > 141  --  142  --  148*  K 4.1   < > 5.8*   < > 3.5  --  3.7  --  3.5  CL 110   < > 113*   < > 109  --  109  --  111  CO2 17*  --  14*   < > 20*  --  22  --  28  GLUCOSE 258*   < > 483*   < > 82  --  272*  --  81  BUN 26*   < > 28*   < > 42*  --  55*  --  53*  CREATININE 1.94*   < > 2.15*   < > 4.16*  --  3.58*  --  2.73*  CALCIUM 8.2*  --  7.0*   < > 8.1*  --  8.2*  --  8.5*  MG  --   --  1.9   < > 1.9   < > 2.2 2.2 2.4  PROT 5.6*  --  4.4*  --   --   --   --   --   --   ALBUMIN 2.6*  --  2.1*  --   --   --   --   --   --   AST 18  --  34  --   --   --   --   --   --   ALT 14  --  19  --   --   --   --   --   --   ALKPHOS 73  --  67  --   --   --   --   --   --   BILITOT 0.4  --  0.5  --   --   --   --   --   --   GFRNONAA 33*  --  29*    < > 13*  --  16*  --  22*  ANIONGAP 10  --  8   < > 12  --  11  --  9   < > = values in this interval not displayed.    Lipids  Recent Labs  Lab 01/09/22 0503  CHOL 85  TRIG 154*  156*  HDL 15*  LDLCALC 39  CHOLHDL 5.7    Hematology Recent Labs  Lab 01/11/22 0532 01/12/22 0203 01/13/22 0429  WBC 17.3* 19.1* 22.0*  RBC 2.70* 2.70* 2.84*  HGB 8.2* 8.4* 8.7*  HCT 24.2* 24.5* 26.3*  MCV 89.6 90.7 92.6  MCH 30.4 31.1 30.6  MCHC 33.9 34.3 33.1  RDW 14.3 14.1 14.2  PLT 317 348 353   Thyroid No results for input(s): "TSH", "FREET4" in the last 168 hours.  BNPNo results for input(s): "BNP", "PROBNP" in the last 168 hours.  DDimer No results for input(s): "DDIMER" in the last 168 hours.   Radiology    DG Abd Portable 1V  Result Date: 01/12/2022 CLINICAL DATA:  Feeding tube placement. EXAM: PORTABLE ABDOMEN - 1 VIEW COMPARISON:  01/10/2022 FINDINGS: Cortrak feeding tube placement with the tip in the region of the distal stomach or proximal duodenum. Visualized bowel gas pattern is nonobstructive. IMPRESSION: Feeding tube tip in the region of the distal stomach/proximal duodenum.  Electronically Signed   By: Aletta Edouard M.D.   On: 01/12/2022 11:11   CT HEAD WO CONTRAST (5MM)  Result Date: 01/11/2022 CLINICAL DATA:  Follow-up stroke. Revascularization of cervical ICA occlusion. EXAM: CT HEAD WITHOUT CONTRAST TECHNIQUE: Contiguous axial images were obtained from the base of the skull through the vertex without intravenous contrast. RADIATION DOSE REDUCTION: This exam was performed according to the departmental dose-optimization program which includes automated exposure control, adjustment of the mA and/or kV according to patient size and/or use of iterative reconstruction technique. COMPARISON:  CT and MRI studies most recently 01/09/2022 FINDINGS: Brain: No abnormality seen affecting the brainstem or cerebellum. Left cerebral hemisphere appears normal by CT. Multiple widely scattered  foci of low density present within the right cerebral hemisphere primarily throughout the right middle cerebral artery territory consistent with innumerable small infarctions. No large regional confluent infarction. Mild generalized swelling. No hemorrhage. No midline shift. Vascular: Right ICA stent in place. Vascular calcification premature for age. Skull: Normal Sinuses/Orbits: Inflammatory changes of the paranasal sinuses. Orbits negative. Other: None IMPRESSION: Patchy low-density throughout the right middle cerebral artery territory consistent with the innumerable small infarction shown by MRI. No large confluent infarction. No evidence of hemorrhage or mass effect. Electronically Signed   By: Nelson Chimes M.D.   On: 01/11/2022 16:15    Cardiac Studies   TTE 01/10/2022 IMPRESSIONS   1. There is akinesis of the mid-to-apical septal LV segments, all apical segments, and apex. There is significant thinning of the mid-to-apical septal segments and apex likely represents scarring/infarct. The basal-to-mid inferior and basal septal  segments are hypokinetic.   2. No LV thrombus visualized on definity imaging.   3. Left ventricular ejection fraction, by estimation, is 30 to 35%. The left ventricle has moderately decreased function. The left ventricle demonstrates regional wall motion abnormalities (see scoring diagram/findings for description). The left  ventricular internal cavity size was mildly-to-moderately dilated. There  is mild concentric left ventricular hypertrophy. Indeterminate diastolic filling due to E-A fusion.   4. Right ventricular systolic function is normal. The right ventricular size is normal.   5. The mitral valve is normal in structure. Trivial mitral valve regurgitation.   6. The aortic valve is tricuspid. Aortic valve regurgitation is not visualized. No aortic stenosis is present.   7. The inferior vena cava is dilated in size with >50% respiratory variability, suggesting right  atrial pressure of 8 mmHg.   8. Agitated saline contrast bubble study was negative, with no evidence of any interatrial shunt.   Comparison(s): Compared to prior TTE on 10/2021, the EF has dropped to 30-35% with similar wall motion.   FINDINGS   Left Ventricle: There is akinesis of the mid-to-apical septal LV  segments, all apical segments, and apex. There is significant thinning of  the mid-to-apical septal segments and apex which likely represents  scarring/infarct. The basal-to-mid inferior and   basal septal segments are hypokinetic. No LV thrombus visualized. Left  ventricular ejection fraction, by estimation, is 30 to 35%. The left  ventricle has moderately decreased function. The left ventricle  demonstrates regional wall motion abnormalities.  Definity contrast agent was given IV to delineate the left ventricular  endocardial borders. The left ventricular internal cavity size was mildly  to moderately dilated. There is mild concentric left ventricular  hypertrophy. Indeterminate diastolic filling   due to E-A fusion.   Right Ventricle: The right ventricular size is normal. No increase in  right ventricular wall thickness. Right ventricular systolic function  is  normal.   Left Atrium: Left atrial size was normal in size.   Right Atrium: Right atrial size was normal in size.   Pericardium: There is no evidence of pericardial effusion.   Mitral Valve: The mitral valve is normal in structure. Trivial mitral  valve regurgitation.   Tricuspid Valve: The tricuspid valve is normal in structure. Tricuspid  valve regurgitation is trivial.   Aortic Valve: The aortic valve is tricuspid. Aortic valve regurgitation is  not visualized. No aortic stenosis is present. Aortic valve mean gradient  measures 4.0 mmHg. Aortic valve peak gradient measures 7.4 mmHg. Aortic  valve area, by VTI measures 2.86  cm.   Pulmonic Valve: The pulmonic valve was normal in structure. Pulmonic valve   regurgitation is trivial.   Aorta: The aortic root is normal in size and structure.   Venous: The inferior vena cava is dilated in size with greater than 50%  respiratory variability, suggesting right atrial pressure of 8 mmHg.   IAS/Shunts: The atrial septum is grossly normal. Agitated saline contrast  was given intravenously to evaluate for intracardiac shunting. Agitated  saline contrast bubble study was negative, with no evidence of any  interatrial shunt.   Patient Profile     41 y.o. female with history of MI w/ cath 2015 med rx for mod-severe dz D1,OM2, RCA (u/a to do PCI), DM1, HTN, chronic systolic CHF 81/1572 LVEF 35-40%, grade III dd, apex akinetic, hypothyroidism, CKD stage IIIa, HLD, class II obesity, hx CVA 2020, tob use (quit at time of CVA).  Assessment & Plan   Coronary artery disease Ischemic cardiomyopathy Heart failure reduced ejection fraction Acute CVA Anemia Acute exacerbation of COPD Diabetes mellitus  CAD -during this hospitalization troponin peaked at 3869 which is trending down.  Her echo did show some wall motion abnormalities which could be due to cardiac injury or Takotsubo cardiomyopathy given her events.  Her EKG today is largely unremarkable compared to her previous EKG however there are PVCs.  Her medication regimen was started yesterday via NG tube.  Would not like to do its increase her beta-blocker dose slightly as she is also hypertensive, therefore we will be beneficial to resume the nitro drip for now.  Continue the aspirin and Brilinta, continue the patient Imdur as well as her statin medication.  The patient is a challenge for this patient as she has just recently had significant CVA and would like to hold off for cardiac catheterization at this time.    In terms of her ischemic cardiomyopathy-cannot get her all of her guideline directed medical therapy due to worsening kidney function.  We will continue to monitor.  We will plan to repeat her  echocardiogram tomorrow.  Heart failure with reduced ejection fraction-does not appear to be volume overloaded we will continue to monitor closely.  Would eventually benefit from a cautious dose of diuretic.  Lasix 40 mg IV x1 dose.  She also did have kidney injury which creatinine now is improving.  She also is diffusely wheezing will defer to the primary team for nebulizer treatments.  Diabetes mellitus Per primary team.  CRITICAL CARE Performed by: Berniece Salines  Total critical care time: 30 minutes. Critical care time was exclusive of separately billable procedures and treating other patients. Critical care was necessary to treat or prevent imminent or life-threatening deterioration. Critical care was time spent personally by me on the following activities: development of treatment plan with patient and/or surrogate as well as nursing, discussions with consultants,  evaluation of patient's response to treatment, examination of patient, obtaining history from patient or surrogate, ordering and performing treatments and interventions, ordering and review of laboratory studies, ordering and review of radiographic studies, pulse oximetry and re-evaluation of patient's condition.  Signed, Berniece Salines, DO [place]  01/13/2022 12:22 PM    For questions or updates, please contact [place] Please consult [email or web address] for contact info under    Signed, Berniece Salines, DO  01/13/2022, 11:38 AM

## 2022-01-13 NOTE — Progress Notes (Signed)
Shirley for heparin Indication: chest pain/ACS  Allergies  Allergen Reactions   Wellbutrin [Bupropion] Hives   Cefepime Rash    Tolerates penicilllin   Ciprofloxacin Hcl Hives and Rash    Hives/rash at injection site    Tape Rash    Patient Measurements: Height: 5\' 5"  (165.1 cm) Weight: 89.4 kg (197 lb 1.5 oz) IBW/kg (Calculated) : 57 Heparin Dosing Weight: 77kg  Vital Signs: Temp: 99.1 F (37.3 C) (09/13 0800) Temp Source: Axillary (09/13 0800) BP: 109/95 (09/13 0700) Pulse Rate: 100 (09/13 0908)  Labs: Recent Labs    01/11/22 0532 01/11/22 1001 01/12/22 0027 01/12/22 0203 01/12/22 0541 01/12/22 0541 01/12/22 1702 01/12/22 2228 01/13/22 0429 01/13/22 0827  HGB 8.2*  --   --  8.4*  --   --   --   --  8.7*  --   HCT 24.2*  --   --  24.5*  --   --   --   --  26.3*  --   PLT 317  --   --  348  --   --   --   --  353  --   APTT  --   --   --   --  45*   < > 43* 52*  --  53*  HEPARINUNFRC  --   --   --   --  0.88*  --   --   --   --  0.67  CREATININE 4.16*  --   --  3.58*  --   --   --   --  2.73*  --   TROPONINIHS  --  3,869* 3,807* 3,512*  --   --   --   --   --   --    < > = values in this interval not displayed.     Estimated Creatinine Clearance: 30.6 mL/min (A) (by C-G formula based on SCr of 2.73 mg/dL (H)).   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 Pt is on apixaban prior to admit, no anticoagulation received this admission. Pt with elevated troponins and now chest pain, pharmacy asked to dose IV heparin for ACS management. Will defer bolus and utilize lower goal in setting of recent CVA. Will check aPTT along with initial heparin level as apixaban may still be affecting level  aPTT remains subtherapeutic at 53 seconds on heparin 1250 units/hr. Level drawn from line with heparin infusion (paused ~2 mins). Changed to heparin in PIV and aPTT from central line and  recheck subtherapeutic at 48.   Goal of Therapy:  Heparin level 0.3-0.5 units/ml aPTT 66-85 seconds Monitor platelets by anticoagulation protocol: Yes   Plan:  Increase heparin to 1400 units/h Check 8 hr aPTT  Cristela Felt, PharmD, BCPS Clinical Pharmacist 01/13/2022 9:58 AM

## 2022-01-13 NOTE — Progress Notes (Addendum)
STROKE TEAM PROGRESS NOTE   INTERVAL HISTORY RN at bedside. Husband at bedside. Patient is awake and alert. Oriented to yes know questions/choices to person and place. She is a little tachypneic at times, and appears anxious. Right gaze preference. Will track, cannot cross midline. Follows commands on right very briskly. Left arm and leg flaccid. Grimaces to pain on left. Still on heparin gtt. MRI brain ordered. No new neurological events overnight.   CBC:  Recent Labs  Lab 01/08/22 1326 01/08/22 1345 01/09/22 0503 01/10/22 0522 01/12/22 0203 01/13/22 0429  WBC 10.0   < > 26.5*   < > 19.1* 22.0*  NEUTROABS 7.3  --  26.0*  --   --   --   HGB 7.6*   < > 9.0*   < > 8.4* 8.7*  HCT 23.9*   < > 25.8*   < > 24.5* 26.3*  MCV 91.9   < > 89.0   < > 90.7 92.6  PLT 399   < > 327   < > 348 353   < > = values in this interval not displayed.    Basic Metabolic Panel:  Recent Labs  Lab 01/12/22 0203 01/12/22 1615 01/13/22 0429  NA 142  --  148*  K 3.7  --  3.5  CL 109  --  111  CO2 22  --  28  GLUCOSE 272*  --  81  BUN 55*  --  53*  CREATININE 3.58*  --  2.73*  CALCIUM 8.2*  --  8.5*  MG 2.2 2.2 2.4  PHOS 4.9* 4.2 3.7    Lipid Panel:  Recent Labs  Lab 01/09/22 0503  CHOL 85  TRIG 154*  156*  HDL 15*  CHOLHDL 5.7  VLDL 31  LDLCALC 39    HgbA1c:  Recent Labs  Lab 01/09/22 1425  HGBA1C 8.0*    Urine Drug Screen:  Recent Labs  Lab 01/09/22 0225  LABOPIA NONE DETECTED  COCAINSCRNUR NONE DETECTED  LABBENZ NONE DETECTED  AMPHETMU NONE DETECTED  THCU NONE DETECTED  LABBARB NONE DETECTED     Alcohol Level  Recent Labs  Lab 01/08/22 1339  ETH <10     IMAGING past 24 hours DG Abd Portable 1V  Result Date: 01/12/2022 CLINICAL DATA:  Feeding tube placement. EXAM: PORTABLE ABDOMEN - 1 VIEW COMPARISON:  01/10/2022 FINDINGS: Cortrak feeding tube placement with the tip in the region of the distal stomach or proximal duodenum. Visualized bowel gas pattern is  nonobstructive. IMPRESSION: Feeding tube tip in the region of the distal stomach/proximal duodenum. Electronically Signed   By: Aletta Edouard M.D.   On: 01/12/2022 11:11    PHYSICAL EXAM  Temp:  [97.2 F (36.2 C)-99.2 F (37.3 C)] 99.1 F (37.3 C) (09/13 0800) Pulse Rate:  [82-100] 95 (09/13 0730) Resp:  [18-36] 24 (09/13 0730) BP: (109-162)/(80-114) 109/95 (09/13 0700) SpO2:  [96 %-100 %] 98 % (09/13 0730) FiO2 (%):  [30 %-36 %] 32 % (09/13 0730) Weight:  [89.4 kg] 89.4 kg (09/13 0500)  General - Critically ill female. Well nourished, well developed, in no apparent distress. Cardiovascular - Regular rhythm and rate.  Mental Status -  Not speaking, Follows commands on the right. Mimicking examiner. Right gaze preference, does not cross midline, left facial droop, tongue midline. PERRL, very sluggish bilaterally, No blink to threat on left.  Motor Strength - Left arm is flaccid. Left leg withdraws to painful stimuli. Grimaces to noxious stimuli on left. Right side moves  spontaneously and purposeful. Motor Tone - Muscle tone was assessed at the neck and appendages and was normal.   Sensory - appears to be intact to noxious stimuli  Coordination - Unable to assess  Gait and Station - deferred.   ASSESSMENT/PLAN Ms. Nikkole Placzek is a 41 y.o. 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 NIHSS of 8. Patient not a TPA candidate, underwent IR thrombectomy of R ICA  with R ICA dissection repair and stent placement with TICI 3  Stroke:  Acute Right MCA and vertebral stroke  s/p thrombectomy with TICI 3 with right ICA dissection repair and stent placement  Etiology:  unclear, likely cardioembolic  Code Stroke CT head 1. Periventricular white matter hypodensities adjacent to the frontal horn of the right lateral ventricles and in the right corona radiata are new since prior exam. This may represent acute ischemia. 2. No acute or focal cortical abnormality. 3. ASPECTS is 10/10 CTA head & neck with CT perfusion  1. 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 Right MCA and A1 ACA are patent, but diminutive. 2. Small/non dominant right vertebral artery, which is irregular throughout its V2 segment with age indeterminate occlusion at the V3 segment with non opacification of the proximal intradural vertebral artery and reconstitution of the distal intradural vertebral artery. 3. Severe left paraclinoid ICA stenosis. 4. One CT perfusion, no evidence of core infarct or penumbra; however, there is approximately 38 mL of T-max greater than 4 seconds in the right MCA territory which suggests possible oligemia. An MRI could provide more sensitive evaluation for acute infarct. 5. Moderate layering bilateral pleural effusions and peribronchial wall thickening. Recommend dedicated chest imaging  Cerebral angio  Occluded right internal carotid artery extracranially and intracranially to the supraclinoid region Status post endovascular complete revascularization of occluded right internal carotid artery proximally and distally intracranially with 1 pass with a 4 mm x 40 mm solitary extragenital device and proximal aspiration, 1 pass with a 5 mm x 37 mm amber trap retrieval device and aspiration proximally, and 1 pass with a 6.5 mm x 45 mm ambulatory with proximal aspiration achieving a complete revascularization of the internal carotid artery.   Status post reconstruction of symptomatic  occlusive long segment dissection extending from the proximal one third of the right internal carotid artery to the petrous cavernous junction using 3 pipeline flow diverter's and a 4 mm x 24 mm Neuroform Atlas stent in a  telescope fashion.  Final arteriogram from the right common carotid artery demonstrates complete revascularization without any filling defects with opacification of the right middle cerebral artery distribution maintaining aTICI 3 revascularization.  Post IR CT  1. Redemonstrated hypodensity in the right frontal periventricular white matter. No additional acute intracranial process. No additional acute infarct or hemorrhage. 2. Air-fluid levels with bubbly fluid in the maxillary sinuses and right sphenoid sinus are likely related to intubation but can be seen in the setting of sinusitis  MRI   1. Extensive patchy areas of acute nonhemorrhagic infarction involving the right MCA  territory, most confluent areas along the watershed. 2. Focal nonhemorrhagic infarct involving the genu of the corpus callosum and more posteriorly along the body of the corpus callosum on the right. 3. Diffuse sinus disease is likely secondary to intubation MRA   1. Abnormal flow signal within the petrous and pre cavernous right ICA may represent artifact from stent or slow flow 2. Signal loss in the supraclinoid left ICA may be artifactual. 3. Asymmetric diffused decreased size of right MCA branch vessels compared to the left likely reflects decreased perfusion. No significant proximal stenosis or occlusion  2D Echo EF 30-35%, akinesis of mid to apical septal LV segments, all apical segments and apex.  LDL 39 HgbA1c 9.6 VTE prophylaxis - SCD's    Diet   Diet NPO time specified   Eliquis (apixaban) daily prior to admission, now on aspirin 81 mg daily and Brilinta (ticagrelor) 90 mg bid.  Therapy recommendations:  CIR Disposition:  pending  Hypertension Home meds:  hydralazine, metoprolol,  isosorbide Stable Long-term BP goal normotensive  Hyperlipidemia Home meds:  fenofibrate, not resumed in hospital LDL 39, goal < 70 Started on atorvastatin. Continue at discharge   Diabetes type II UnControlled Metabolic Acidosis  DKA Home meds:  insulin HgbA1c 9.6, goal < 7.0 CBGs Recent Labs    01/12/22 2350 01/13/22 0416 01/13/22 0757  GLUCAP 201* 88 131*     insulin gtt currently off Correct electrolyte derangements SSI Needs close follow up with PCP to help manage DM   Other Stroke Risk Factors  Former Cigarette smoker advised to stop smoking Obesity, Body mass index is 32.8 kg/m., BMI >/= 30 associated with increased stroke risk, recommend weight loss, diet and exercise as appropriate  Hx stroke/TIA Coronary artery disease Congestive heart failure  Other Active Problems Acute hypoxic respiratory Failure, resolved  managed by CCM Off prop Extubated 9/10 On bipap- CCM to trial off today  Chronic Combined HF EF 30-35%, decreased LV function, bubble study negative Meds on hold currently  Hypothyroidism  Home synthroid   AKI on CKD  IVF  Worsening Cr 2.50-3.07-3.63-3.80-4.17-4.54-4.16-2.73 Renal following  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 MD to update note as needed.   Beulah Gandy DNP, ACNPC-AG  [facility]  ATTENDING ATTESTATION:   41 year old status post thrombectomy for 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 On  aspirin and Brilinta.   She is on heparin drip for elevated troponin possible coronary syndrome.  Being followed by cardiology.  Worsening chest pain  today, consideration for possible catheterization but being put on hold due to her CVA.  Would like to get another MRI as her exam is unchanged and she is still unable to produce any speech.  We will lay the patient flat this morning she was able to tolerate it and is off BiPAP.  Metoprolol added by cardiology.     Dr. Reeves Forth evaluated pt independently, reviewed imaging, chart, labs. Discussed and formulated plan with the APP. Please see APP note above for details.     This patient is critically ill due to respiratory distress, possible sepsis/osteo, and  stroke s/p  thrombectomy and at significant risk of neurological worsening, death form heart failure, respiratory failure, recurrent stroke, bleeding from [facility], seizure, sepsis. This patient's care requires constant monitoring of vital signs, hemodynamics, respiratory and cardiac monitoring, review of multiple databases, neurological assessment, discussion with family, other specialists and medical decision making of high complexity. I spent 35 minutes of neurocritical care time in the care of this patient.    Minal Stuller,MD

## 2022-01-13 NOTE — Progress Notes (Signed)
[facility] Progress Note   Subjective:   Seen at bedside this AM, husband present.  Having some agitation/frustration with aphasia.  I/Os yest 1370 / 2750 with furosemide 60 IV yest AM.  HyperNa this AM, FWF started.   Objective Vitals:   01/13/22 0700 01/13/22 0730 01/13/22 0800 01/13/22 0908  BP: (!) 109/95     Pulse: 93 95  100  Resp: (!) 24 (!) 24    Temp:   99.1 F (37.3 C)   TempSrc:   Axillary   SpO2: 98% 98%    Weight:    89.4 kg  Height:    5' 5" (1.651 m)   Physical Exam General: lying at 30 degrees Heart: RRR Lungs: clear but tachypnea noted while she's frustrated Abdomen:soft, obese Extremities: no edema Neuro:  will localize some by L side not moving, attempting to communicate with husband  Additional Objective Labs: Basic Metabolic Panel: Recent Labs  Lab 01/11/22 0532 01/11/22 1005 01/12/22 0203 01/12/22 1615 01/13/22 0429  NA 141  --  142  --  148*  K 3.5  --  3.7  --  3.5  CL 109  --  109  --  111  CO2 20*  --  22  --  28  GLUCOSE 82  --  272*  --  81  BUN 42*  --  55*  --  53*  CREATININE 4.16*  --  3.58*  --  2.73*  CALCIUM 8.1*  --  8.2*  --  8.5*  PHOS 5.7*   < > 4.9* 4.2 3.7   < > = values in this interval not displayed.    Liver Function Tests: Recent Labs  Lab 01/08/22 1326 01/08/22 2014  AST 18 34  ALT 14 19  ALKPHOS 73 67  BILITOT 0.4 0.5  PROT 5.6* 4.4*  ALBUMIN 2.6* 2.1*    No results for input(s): "LIPASE", "AMYLASE" in the last 168 hours. CBC: Recent Labs  Lab 01/08/22 1326 01/08/22 1345 01/09/22 0503 01/10/22 0522 01/10/22 1509 01/11/22 0532 01/12/22 0203 01/13/22 0429  WBC 10.0   < > 26.5* 19.4* 21.5* 17.3* 19.1* 22.0*  NEUTROABS 7.3  --  26.0*  --   --   --   --   --   HGB 7.6*   < > 9.0* 7.3* 8.4* 8.2* 8.4* 8.7*  HCT 23.9*   < > 25.8* 21.6* 25.0* 24.2* 24.5* 26.3*  MCV 91.9   < > 89.0 89.6 91.6 89.6 90.7 92.6  PLT 399   < > 327 315 293 317 348 353   < > = values in this interval not  displayed.    Blood Culture    Component Value Date/Time   SDES TRACHEAL ASPIRATE 01/09/2022 0408   SPECREQUEST NONE 01/09/2022 0408   CULT  01/09/2022 0408    RARE Normal respiratory flora-no Staph aureus or Pseudomonas seen Performed at [facility], [place] [address], [place], [place] [address]    REPTSTATUS 01/11/2022 FINAL 01/09/2022 0408    Cardiac Enzymes: No results for input(s): "CKTOTAL", "CKMB", "CKMBINDEX", "TROPONINI" in the last 168 hours. CBG: Recent Labs  Lab 01/12/22 1604 01/12/22 1955 01/12/22 2350 01/13/22 0416 01/13/22 0757  GLUCAP 227* 225* 201* 88 131*    Iron Studies: No results for input(s): "IRON", "TIBC", "TRANSFERRIN", "FERRITIN" in the last 72 hours. _0 @ Studies/Results: DG Abd Portable 1V  Result Date: 01/12/2022 CLINICAL DATA:  Feeding tube placement. EXAM: PORTABLE ABDOMEN - 1 VIEW COMPARISON:  01/10/2022  FINDINGS: Cortrak feeding tube placement with the tip in the region of the distal stomach or proximal duodenum. Visualized bowel gas pattern is nonobstructive. IMPRESSION: Feeding tube tip in the region of the distal stomach/proximal duodenum. Electronically Signed   By: Aletta Edouard M.D.   On: 01/12/2022 11:11   CT HEAD WO CONTRAST (5MM)  Result Date: 01/11/2022 CLINICAL DATA:  Follow-up stroke. Revascularization of cervical ICA occlusion. EXAM: CT HEAD WITHOUT CONTRAST TECHNIQUE: Contiguous axial images were obtained from the base of the skull through the vertex without intravenous contrast. RADIATION DOSE REDUCTION: This exam was performed according to the departmental dose-optimization program which includes automated exposure control, adjustment of the mA and/or kV according to patient size and/or use of iterative reconstruction technique. COMPARISON:  CT and MRI studies most recently 01/09/2022 FINDINGS: Brain: No abnormality seen affecting the brainstem or cerebellum. Left cerebral hemisphere appears normal by CT. Multiple  widely scattered foci of low density present within the right cerebral hemisphere primarily throughout the right middle cerebral artery territory consistent with innumerable small infarctions. No large regional confluent infarction. Mild generalized swelling. No hemorrhage. No midline shift. Vascular: Right ICA stent in place. Vascular calcification premature for age. Skull: Normal Sinuses/Orbits: Inflammatory changes of the paranasal sinuses. Orbits negative. Other: None IMPRESSION: Patchy low-density throughout the right middle cerebral artery territory consistent with the innumerable small infarction shown by MRI. No large confluent infarction. No evidence of hemorrhage or mass effect. Electronically Signed   By: Nelson Chimes M.D.   On: 01/11/2022 16:15   Medications:  feeding supplement (GLUCERNA 1.5 CAL) 55 mL/hr at 01/13/22 0800   heparin 1,250 Units/hr (01/13/22 0700)   nitroGLYCERIN Stopped (01/12/22 1414)    amoxicillin-clavulanate  1 tablet Per Tube Q12H   arformoterol  15 mcg Nebulization BID   aspirin  81 mg Oral Daily   Or   aspirin  81 mg Per Tube Daily   atorvastatin  40 mg Per Tube Daily   budesonide (PULMICORT) nebulizer solution  0.5 mg Nebulization BID   Chlorhexidine Gluconate Cloth  6 each Topical Daily   docusate  100 mg Per Tube BID   feeding supplement (PROSource TF20)  60 mL Per Tube Daily   free water  200 mL Per Tube Q6H   insulin aspart  0-20 Units Subcutaneous Q4H   insulin aspart  2 Units Subcutaneous Q4H   insulin glargine-yfgn  30 Units Subcutaneous Q24H   isosorbide mononitrate  20 mg Per Tube BID   levothyroxine  75 mcg Per Tube Q0600   methylPREDNISolone (SOLU-MEDROL) injection  40 mg Intravenous Daily   metoprolol tartrate  12.5 mg Per Tube BID   nicotine  21 mg Transdermal Daily   pantoprazole  40 mg Per Tube Daily   polyethylene glycol  17 g Per Tube Daily   revefenacin  175 mcg Nebulization Daily   ticagrelor  90 mg Oral BID   Or   ticagrelor  90  mg Per Tube BID   Assessment/ Plan: AKI on CKD IV - b/l creat 2.1- 2.3 from July 2023, eGFR 27-29 ml/min. Appears she follows with Dr Hinda Lenis in [place].  Creat here was 2.0 on admission for acute CVA. Pt went for thrombectomy 9/8 per IR w/ stent placement. Pt got > 1 IV contrast doses on 9/8 for diagnostic CT scans. Cr peaked 9/10 at 4.5 and has been trending down, 2.7 this AM. BP's mostly normal, no sig hypotension. UA + proteinuria which is chronic, no hematuria. Renal US c/w  CKD, no obstruction.  AKI due to contrast nephropathy most likely.  UOP is adequate and responded to diuretic therapy.  No indications for dialysis.  Time will tell if she returns to prior baseline or has some residual worsening of CKD after AKI episode.  Nothing further to add, will sign off.  Please have her f/u with nephrologist in 4wks.  Hypernatremia - has been started on FWF this AM. Agree.  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 Improving.  Can redose lasix PRN - BUN trending up and was fairly net neg yesterday so hold for now.  DM2 - per pmd CAD hx of MI - CP 2 nights ago, on heparin gtt, cardiology following.    As above: Nothing further to add, will sign off.  Please have her f/u with nephrologist Dr. Hinda Lenis in 4wks.   Jannifer Hick MD 01/13/2022, 10:59 AM  [facility] Pager: [phone]

## 2022-01-13 NOTE — Consult Note (Signed)
PODIATRY CONSULTATION  NAME Lauren [name] MRN [id number] DOB 22-Feb-1981 DOA 01/08/2022   Reason for consult: Ulcer/osteomyelitis LT foot Chief Complaint  Patient presents with   Code Stroke    Consulting physician:   History of present illness: 41 y.o. female PMHx CAD, DM type II w/ neuropathy, CKD stage III, PVD and a PSxHx partial fifth ray amputation LT foot with nonhealing wound/ulcer of the amputation site with bone marrow edema in the fourth metatarsal head concerning for osteomyelitis admitted and in the ICU for stroke.  Podiatry was contacted today to discuss the nonhealing ulcer of the amputation site of the left foot. Patient's daughter, Elder Negus, was present this evening during evaluation  Past Medical History:  Diagnosis Date   Anemia    CAD (coronary artery disease)    a. s/p cath in 03/2014 showing 30% mid-LAD, moderate to severe disease along small D1, patent LCx, moderate to severe distal OM2 stenosis and moderate diffuse diease along RCA not amenable to PCI   CHF (congestive heart failure) ([place])    a. EF 55-60% in 12/2019 b. EF at 35-40% by echo in 05/2020   CKD (chronic kidney disease) stage 4, GFR 15-29 ml/min (HCC)    Diabetes mellitus without complication (HCC)    Myocardial infarction ([place])    Neuropathy    Stroke ([place])        Latest Ref Rng & Units 01/13/2022    4:29 AM 01/12/2022    2:03 AM 01/11/2022    5:32 AM  CBC  WBC 4.0 - 10.5 K/uL 22.0  19.1  17.3   Hemoglobin 12.0 - 15.0 g/dL 8.7  8.4  8.2   Hematocrit 36.0 - 46.0 % 26.3  24.5  24.2   Platelets 150 - 400 K/uL 353  348  317        Latest Ref Rng & Units 01/13/2022    4:29 AM 01/12/2022    2:03 AM 01/11/2022    5:32 AM  BMP  Glucose 70 - 99 mg/dL 81  272  82   BUN 6 - 20 mg/dL 53  55  42   Creatinine 0.44 - 1.00 mg/dL 2.73  3.58  4.16   Sodium 135 - 145 mmol/L 148  142  141   Potassium 3.5 - 5.1 mmol/L 3.5  3.7  3.5   Chloride 98 - 111 mmol/L 111  109  109   CO2 22 - 32 mmol/L 28  22   20    Calcium 8.9 - 10.3 mg/dL 8.5  8.2  8.1       Physical Exam: General: Nonverbal.  Nonambulatory. R MCA stroke and vertebral artery stroke  Dermatology: Hyperkeratotic callus tissue around the amputation site of the lateral aspect of the left forefoot.  No drainage.  No malodor.  Vascular: VAS [name] ABI W/WO TBI 08/28/2021 Summary: Right: Resting right ankle-brachial index indicates mild right lower extremity arterial disease. The right toe-brachial index is abnormal. Left: Resting left ankle-brachial index is within normal range. No evidence of significant left lower extremity arterial disease. The left toe-brachial index is abnormal.  Neurological: Light touch and protective threshold diminished bilateral  Musculoskeletal Exam: Prior partial fifth ray amputation LT foot  MR FOOT LT WO CONTRAST 01/02/2022 IMPRESSION: Osteomyelitis of the distal fourth metatarsal head and likely early osteomyelitis of the adjacent proximal phalanx. Trace fourth MTP joint effusion suggesting developing septic arthritis. Adjacent soft tissue swelling without evidence of soft tissue abscess on noncontrast MRI. Postsurgical changes of partial fifth ray  amputation with minimal marrow edema at the distal surgical margin of the residual metatarsal, likely related to recent surgery.  ASSESSMENT/PLAN OF CARE As/P partial fifth ray amputation LT foot with residual osteomyelitis fourth metatarsal -Currently the amputation site appears very stable and dry.  No drainage.  No malodor.  Recommend Betadine wet-to-dry dressing as needed.  Wound care orders placed -Currently no plan for any surgical intervention especially given the stable nature of the left foot -Recommend infectious disease consult for IV ABX recommendations.  Attending hospitalist notified of recommendation -Podiatry will sign off.  Podiatry will follow up post discharge in office depending on patient placement postdischarge    Thank you for the  consult.  Please contact me directly with any questions or concerns.    Edrick Kins, DPM [facility]  Dr. Edrick Kins, DPM    [address], [place] [address]                Office [phone]  Fax [phone]

## 2022-01-13 NOTE — Progress Notes (Signed)
OT Cancellation Note  Patient Details Name: Lauren [name] MRN: [id number] DOB: August 26, 1980   Cancelled Treatment:    Reason Eval/Treat Not Completed: Medical issues which prohibited therapy (Upon arrival, pt's RR 40-52. RN notified, OT to f/u when pt is stable.)  [name] 01/13/2022, 1:02 PM

## 2022-01-13 NOTE — Progress Notes (Signed)
Inpatient Rehab Admissions Coordinator:    Pt. With change in medical status today, unable to work with PT. Will follow up once stable for potential CIR admit.   Clemens Catholic, [place], [place] Admissions Coordinator  [phone] ([place]) [phone] (office)

## 2022-01-13 NOTE — Progress Notes (Addendum)
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   Patient presented to the ED 9/8 with left sided facial droop, left sided weakness, and slurred speech.  LKW: unknown.  Code stroke initiated. NIHSS: 8.  Imaging revealed: age indeterminate occlusion of proximal right ICA, V3 segment of right vertebral artery, left paraclinoid ICA stenosis. No core infarct or penumbra. Patient went immediately to IR for thrombectomy.  Of note, patient with recent arterial thrombectomy of the right upper extremity due to limb ischemia.  She has had URI x 2 days with recent sick contact.   COVID negative but Rhinovirus positive. Extubated in ICU post procedure.    Pertinent  Medical History  Anemia  CAD s/p MI CHF - LVEF 55-60%, down to 35-40% in 05/2020 CKD - Stage IV DM  Neuropathy  LLE Toe Amputation R ICA CVA   Significant Hospital Events: Including procedures, antibiotic start and stop dates in addition to other pertinent events   9/8 Admitted with left sided weakness, facial droop.  NIHSS 8, to IR  for thrombectomy, intubated.  MRSA PCR +, Rhinovirus + 9/10 PSV wean, moving RUE/RLE, extubated  9/11 ECHO>, CT head negative for acute changes, Trach asp negative,   Interim History / Subjective:  Off nitro GTT  Tolerated 3-4L [place] for most of day, was put back on bipap for 5 hours overnight per nursing  Tmax 99.2  +1.7L admit, 2.7L UOP, -1.4L past 24 hours   Subjective: patient shakes head no to chest pain and SOB.  Objective   Blood pressure 139/80, pulse 93, temperature 99.2 F (37.3 C), temperature source Oral, resp. rate 20, height 5\' 5"  (1.651 m), weight 89.4 kg, last menstrual  period 11/01/2021, SpO2 100 %. CVP:  [3 mmHg-13 mmHg] 3 mmHg  Vent Mode: BIPAP FiO2 (%):  [30 %-36 %] 30 %   Intake/Output Summary (Last 24 hours) at 01/13/2022 0646 Last data filed at 01/13/2022 0600 Gross per 24 hour  Intake 1313.67 ml  Output 2750 ml  Net -1436.33 ml   Filed Weights   01/11/22 0500 01/12/22 0500 01/13/22 0500  Weight: 95.4 kg 94 kg 89.4 kg    Examination: General: In bed, NAD, appears comfortable HEENT: MM pink/moist, anicteric, atraumatic Neuro: RASS 0, PERRL 47mm, non verbal, opens eyes spont, moves RUE, RLE to command CV: S1S2, NSR, no m/r/g appreciated PULM:  wheezes in the upper lobes, wheezes in the lower lobes, trachea midline, chest expansion symmetric GI: soft, bsx4 active, non-tender   Extremities: warm/dry, trace pretibial edema, capillary refill less than 3 seconds  Skin: Foot wound as below, no rashes or lesions noted     Labs/Imaging BC NGTD day 4 Bg 81-227 WBC 26.5 > 19.4 > 21.5 > 17.3 > 19.1> 22 Hemoglobin 8.4 > 8.2 > 8.4 >8.7 Creatinine 4.54 > 4.16 > 3.58 > 2.73 BUN 42 > 42 > 55> 53 K 3.5 NA 142>148   Resolved Hospital Problem list     Assessment & Plan:  Right MCA and right vertebral stroke s/p thrombectomy, right ICA dissection repair, stent placement Hx R Hand Ischemia s/p Thrombectomy 10/2021 Dysphagia  Not a candidate for thrombolytics, was on Eliquis PTA. Post procedure  CT Head negative for ICH. S/p cangrelor. Repeat CTH on 9/11 negative for acute changes -management per neurology. Appreciate neurology and IR assistance -Goal normotension -Continue ASA and Brilenta -Monitor neuro exam -Continue PT, OT, SLP -Will need repeat MRI per neurology, will attempt to see if patient tolerates laying flat today for imaging.  Acute Hypoxic Respiratory Failure  Rhinovirus Positive  Acute Exacerbation of Suspected COPD / Bronchospasm  Tobacco Abuse  No documented hx of underlying lung disease but at minimum 1ppd smoker. Positive for  rhinovirus. Tolerated 3-4L [place] for most of day on 9/12, was put back on bipap for 5 hours overnight per nursing. -Goal SPO2 92-98%. Wean O2 to goal. If tachypnic do not use BiPAP, start HHF Halfway -Continue solumedrol for 5 days total (9/16) -Continue brovana, pulmicort, and yupelri nebs -Aspiration precautions -Aggressive pulmonary hygiene -Encourage tobacco cessation  AKI on CKD IV Acute Metabolic Acidosis 2/2 DKA  Contrast Induced Nephropathy  Admit sr cr 2.0, DKA on admit resolved. UA unremarkable. AKI suspected to be related to CIN. Diuresed 9/10-12 with good response. Creatinine 4.54 > 4.16 > 3.58 > 2.73, BUN 42 > 42 > 55> 53 -Ensure renal perfusion. Goal MAP 65 or greater. -Avoid neprotoxic drugs as possible. -Strict I&O's -Follow up AM creatinine  Normocytic Anemia Multifactorial in setting of blood loss, chronic disease. PRBC 9/10, Hemoglobin 8.4 > 8.2 > 8.4> 8.7 -Transfuse PRBC if HBG less than 7 -Obtain AM CBC to trend H&H -Monitor for signs of bleeding  Chronic Systolic & Diastolic CHF  CAD  Troponin elevation Chest Discomfort  HTN ECHO 10/2021 with LVEF 40-45%, G2DDEcho 9/10: LVEF 30 to 35%, left ventricle mildly decreased function, akinesis of the mid to apical septal LV segments, all apical segments, and apex.  Significant thinning of the mid to apical septal segments and apex reported as likely representing scarring/infarct. Patient with C/O chest pain overnight 9/11. Started on heparin gtt and NTG GTT by cards. Troponin: 71 > 1.3 K >8 16 > 3.8 K >3.8 K >3.5 K. Off nitro gtt -Cardiology following> Would appreciate input on heparin gtt cessation -Continue DAPT -Continue telemetry -Continue metop 12.5 BID -Continue Imdur 20 BID -PRN troponin and 12 lead -Discussed Hypercoagulable workup with Dr. Chryl Heck over phone on 9/12. Dr. Chryl Heck would like [name] to call again on 9/14.  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 Suspect secondary to L foot osteo and steroids. Tmax 99.2. -Monitor fever/WBC curve  Hypokalemia Hypernatremia -Replete K -Start conservative free h20: 200 q6h. -Follow on AM BMp  DM I Bg 81-227. ?drop secondary to TF stopped on bipap.  -appreciate DM assistance -Continue SSI Resistant -Continue TF coverage - Continue semglee 30u [place]  At Risk Malnutrition  -Continue TF   Best Practice (right click and "Reselect all SmartList Selections" daily)  Diet/type: tubefeeds DVT prophylaxis: systemic heparin GI prophylaxis: PPI Lines: No longer needed.  Order written to d/c  Foley:  N/A Code Status:  full code Last date of multidisciplinary goals of care discussion: 9/11    Critical Care Time: 35 minutes    The patient is critically ill with multiple organ systems failure and requires high complexity decision making for assessment and support, frequent evaluation and titration of therapies, application of advanced monitoring technologies and extensive interpretation of multiple databases.  Critical Care Time devoted to patient care services described in this note is 35 minutes. This time reflects time of care of this [place] NP. This critical care time does not reflect procedure time but could involve care discussion time with the PCCM attending.  Redmond School., MSN, APRN, AGACNP-BC  Pulmonary & Critical Care  01/13/2022 , 6:46 AM  Please see [email or web address] for pager details  If no response, please call [phone] After hours, please call Elink at [phone]

## 2022-01-13 NOTE — Progress Notes (Signed)
1035: Pt reporting sudden onset chest pain. RN notified Hillery Aldo, CCM NP immediately. Order given for stat EKG  1041: Stat EKG completed, hand delivered to NP. RN also informed NP of Cheyne-Stokes-like respirations without other changes in neurologic status. RN advised by pharmacy to move heparin infusion from CVC to PIV and redraw am heparin level.  1055: Heparin and troponin levels drawn from CVC and sent to lab.

## 2022-01-13 NOTE — Progress Notes (Signed)
PCCM brief update:  Notified by Cheri Rous RN that patient had developed chest pain again. 12 lead ordered. Some concern for mild ST changes and new PVC. Troponin ordered. Cardiology called. Spoke with Dr. Harriet Masson and notified of concern for ischemic changes on 12 lead and chest pain. Dr.  Harriet Masson to see patient. Patient remains on 4L [place]. Following commands with RUE and RLE. Air movement in all lobes.  -Follow up cardiology note -Follow up troponin -Continue heparin GTT -On BB, imdur per tube - 1mg  morphine -Continue telemetry -Goal SPO2 92-98% -Defer to cardiology regarding resuming Nitro gtt.  Redmond School., MSN, APRN, AGACNP-BC [facility]  01/13/2022 , 11:25 AM  Please see [email or web address] for pager details  If no response, please call [phone] After hours, please call Elink at [phone]

## 2022-01-13 NOTE — Progress Notes (Signed)
PT Cancellation Note  Patient Details Name: Quintavia Rogstad MRN: [id number] DOB: 1980-07-04   Cancelled Treatment:    Reason Eval/Treat Not Completed: Medical issues which prohibited therapy; patient with respirations in the 50's and RN reports borderline cardiac event with EKG's and labs drawn and cardiology outside the room.  Will attempt another day.   Reginia Naas 01/13/2022, 11:42 AM Magda Kiel, PT Acute Rehabilitation Services Office:[phone] 01/13/2022

## 2022-01-13 NOTE — Progress Notes (Signed)
[place] for heparin Indication: chest pain/ACS  Allergies  Allergen Reactions   Wellbutrin [Bupropion] Hives   Cefepime Rash    Tolerates penicilllin   Ciprofloxacin Hcl Hives and Rash    Hives/rash at injection site    Tape Rash    Patient Measurements: Height: 5\' 5"  (165.1 cm) Weight: 94 kg (207 lb 3.7 oz) IBW/kg (Calculated) : 57 Heparin Dosing Weight: 77kg  Vital Signs: Temp: 97.4 F (36.3 C) (09/12 2000) Temp Source: Oral (09/12 2000) BP: 156/90 (09/12 2300) Pulse Rate: 95 (09/12 2300)  Labs: Recent Labs    01/10/22 1509 01/10/22 1757 01/11/22 0532 01/11/22 1001 01/12/22 0027 01/12/22 0203 01/12/22 0541 01/12/22 1702 01/12/22 2228  HGB 8.4*  --  8.2*  --   --  8.4*  --   --   --   HCT 25.0*  --  24.2*  --   --  24.5*  --   --   --   PLT 293  --  317  --   --  348  --   --   --   APTT  --   --   --   --   --   --  45* 43* 52*  HEPARINUNFRC  --   --   --   --   --   --  0.88*  --   --   CREATININE  --  4.54* 4.16*  --   --  3.58*  --   --   --   TROPONINIHS  --   --   --  3,869* 3,807* 3,512*  --   --   --      Estimated Creatinine Clearance: 23.9 mL/min (A) (by C-G formula based on SCr of 3.58 mg/dL (H)).   Assessment: 37 yoF admitted with R MCA vertebral stroke s/p thrombectomy and stent placement. Pt is on apixaban prior to admit, no anticoagulation received this admission. Pt with elevated troponins and now chest pain, pharmacy asked to dose IV heparin for ACS management. Will defer bolus and utilize lower goal in setting of recent CVA. Will check aPTT along with initial heparin level as apixaban may still be affecting level   aPTT remains subtherapeutic at 52 seconds, drawn slightly early.  Goal of Therapy:  Heparin level 0.3-0.5 units/ml aPTT 66-85 seconds Monitor platelets by anticoagulation protocol: Yes   Plan:  Increase heparin to 1250 units/h Recheck aPTT and heparin level in 8h  Arrie Senate,  PharmD, [place], [facility] Clinical Pharmacist Please check AMION for all [facility] numbers 01/13/2022

## 2022-01-14 ENCOUNTER — Inpatient Hospital Stay (HOSPITAL_COMMUNITY): Payer: No Typology Code available for payment source

## 2022-01-14 ENCOUNTER — Telehealth: Payer: Self-pay | Admitting: *Deleted

## 2022-01-14 ENCOUNTER — Inpatient Hospital Stay: Payer: No Typology Code available for payment source

## 2022-01-14 ENCOUNTER — Inpatient Hospital Stay
Payer: No Typology Code available for payment source | Attending: Hematology and Oncology | Admitting: Hematology and Oncology

## 2022-01-14 DIAGNOSIS — L97529 Non-pressure chronic ulcer of other part of left foot with unspecified severity: Secondary | ICD-10-CM

## 2022-01-14 DIAGNOSIS — I6521 Occlusion and stenosis of right carotid artery: Secondary | ICD-10-CM | POA: Diagnosis not present

## 2022-01-14 DIAGNOSIS — I255 Ischemic cardiomyopathy: Secondary | ICD-10-CM | POA: Diagnosis not present

## 2022-01-14 DIAGNOSIS — I2510 Atherosclerotic heart disease of native coronary artery without angina pectoris: Secondary | ICD-10-CM | POA: Diagnosis not present

## 2022-01-14 DIAGNOSIS — I420 Dilated cardiomyopathy: Secondary | ICD-10-CM

## 2022-01-14 DIAGNOSIS — Z89422 Acquired absence of other left toe(s): Secondary | ICD-10-CM

## 2022-01-14 LAB — CBC
HCT: 23.7 % — ABNORMAL LOW (ref 36.0–46.0)
Hemoglobin: 7.7 g/dL — ABNORMAL LOW (ref 12.0–15.0)
MCH: 30.2 pg (ref 26.0–34.0)
MCHC: 32.5 g/dL (ref 30.0–36.0)
MCV: 92.9 fL (ref 80.0–100.0)
Platelets: 347 10*3/uL (ref 150–400)
RBC: 2.55 MIL/uL — ABNORMAL LOW (ref 3.87–5.11)
RDW: 14.7 % (ref 11.5–15.5)
WBC: 22.6 10*3/uL — ABNORMAL HIGH (ref 4.0–10.5)
nRBC: 0 % (ref 0.0–0.2)

## 2022-01-14 LAB — GLUCOSE, CAPILLARY
Glucose-Capillary: 123 mg/dL — ABNORMAL HIGH (ref 70–99)
Glucose-Capillary: 182 mg/dL — ABNORMAL HIGH (ref 70–99)
Glucose-Capillary: 191 mg/dL — ABNORMAL HIGH (ref 70–99)
Glucose-Capillary: 196 mg/dL — ABNORMAL HIGH (ref 70–99)
Glucose-Capillary: 245 mg/dL — ABNORMAL HIGH (ref 70–99)
Glucose-Capillary: 269 mg/dL — ABNORMAL HIGH (ref 70–99)
Glucose-Capillary: 329 mg/dL — ABNORMAL HIGH (ref 70–99)
Glucose-Capillary: 334 mg/dL — ABNORMAL HIGH (ref 70–99)
Glucose-Capillary: 339 mg/dL — ABNORMAL HIGH (ref 70–99)
Glucose-Capillary: 383 mg/dL — ABNORMAL HIGH (ref 70–99)
Glucose-Capillary: 401 mg/dL — ABNORMAL HIGH (ref 70–99)
Glucose-Capillary: 430 mg/dL — ABNORMAL HIGH (ref 70–99)

## 2022-01-14 LAB — BASIC METABOLIC PANEL
Anion gap: 11 (ref 5–15)
Anion gap: 11 (ref 5–15)
BUN: 48 mg/dL — ABNORMAL HIGH (ref 6–20)
BUN: 54 mg/dL — ABNORMAL HIGH (ref 6–20)
CO2: 25 mmol/L (ref 22–32)
CO2: 23 mmol/L (ref 22–32)
Calcium: 8.2 mg/dL — ABNORMAL LOW (ref 8.9–10.3)
Calcium: 7.7 mg/dL — ABNORMAL LOW (ref 8.9–10.3)
Chloride: 112 mmol/L — ABNORMAL HIGH (ref 98–111)
Chloride: 108 mmol/L (ref 98–111)
Creatinine, Ser: 2.47 mg/dL — ABNORMAL HIGH (ref 0.44–1.00)
Creatinine, Ser: 2.48 mg/dL — ABNORMAL HIGH (ref 0.44–1.00)
GFR, Estimated: 25 mL/min — ABNORMAL LOW (ref >60)
GFR, Estimated: 25 mL/min — ABNORMAL LOW (ref >60)
Glucose, Bld: 137 mg/dL — ABNORMAL HIGH (ref 70–99)
Glucose, Bld: 429 mg/dL — ABNORMAL HIGH (ref 70–99)
Potassium: 3.6 mmol/L (ref 3.5–5.1)
Potassium: 5.2 mmol/L — ABNORMAL HIGH (ref 3.5–5.1)
Sodium: 148 mmol/L — ABNORMAL HIGH (ref 135–145)
Sodium: 142 mmol/L (ref 135–145)

## 2022-01-14 LAB — ANTITHROMBIN III: AntiThromb III Func: 97 % (ref 75–120)

## 2022-01-14 LAB — HEPARIN LEVEL (UNFRACTIONATED): Heparin Unfractionated: 0.58 IU/mL (ref 0.30–0.70)

## 2022-01-14 LAB — APTT
aPTT: 63 seconds — ABNORMAL HIGH (ref 24–36)
aPTT: 45 seconds — ABNORMAL HIGH (ref 24–36)
aPTT: 96 seconds — ABNORMAL HIGH (ref 24–36)

## 2022-01-14 LAB — ECHOCARDIOGRAM COMPLETE
AR max vel: 2.51 cm2
AV Area VTI: 2.65 cm2
AV Area mean vel: 2.5 cm2
AV Mean grad: 3 mmHg
AV Peak grad: 6.2 mmHg
Ao pk vel: 1.24 m/s
Area-P 1/2: 4.63 cm2
Calc EF: 37.1 %
Height: 65 in
MV M vel: 3.4 m/s
MV Peak grad: 46.2 mmHg
S' Lateral: 3.55 cm
Single Plane A2C EF: 41.5 %
Single Plane A4C EF: 37 %
Weight: 3153.46 oz

## 2022-01-14 LAB — MAGNESIUM: Magnesium: 2.4 mg/dL (ref 1.7–2.4)

## 2022-01-14 MED ORDER — POTASSIUM CHLORIDE 20 MEQ PO PACK
40.0000 meq | PACK | Freq: Once | ORAL | Status: AC
  Administered 2022-01-14: 40 meq
  Filled 2022-01-14: qty 2

## 2022-01-14 MED ORDER — INSULIN REGULAR(HUMAN) IN NACL 100-0.9 UT/100ML-% IV SOLN
INTRAVENOUS | Status: DC
  Administered 2022-01-14: 10 [IU]/h via INTRAVENOUS
  Administered 2022-01-15: 2.6 [IU]/h via INTRAVENOUS
  Filled 2022-01-14 (×2): qty 100

## 2022-01-14 MED ORDER — FREE WATER
300.0000 mL | Status: DC
  Administered 2022-01-14 – 2022-01-18 (×24): 300 mL

## 2022-01-14 MED ORDER — DEXTROSE 50 % IV SOLN: 0.0000 mL | INTRAVENOUS | Status: DC | PRN

## 2022-01-14 MED ORDER — PERFLUTREN LIPID MICROSPHERE
1.0000 mL | INTRAVENOUS | Status: AC | PRN
  Administered 2022-01-14: 2 mL via INTRAVENOUS

## 2022-01-14 MED ORDER — IPRATROPIUM-ALBUTEROL 0.5-2.5 (3) MG/3ML IN SOLN
3.0000 mL | RESPIRATORY_TRACT | Status: DC | PRN
  Administered 2022-01-14: 3 mL via RESPIRATORY_TRACT

## 2022-01-14 MED ORDER — MORPHINE SULFATE (PF) 2 MG/ML IV SOLN
1.0000 mg | Freq: Once | INTRAVENOUS | Status: AC
  Administered 2022-01-14: 1 mg via INTRAVENOUS
  Filled 2022-01-14: qty 1

## 2022-01-14 MED ORDER — FUROSEMIDE 10 MG/ML IJ SOLN
40.0000 mg | Freq: Once | INTRAMUSCULAR | Status: AC
  Administered 2022-01-14: 40 mg via INTRAVENOUS
  Filled 2022-01-14: qty 4

## 2022-01-14 MED ORDER — INSULIN ASPART 100 UNIT/ML IJ SOLN
4.0000 [IU] | INTRAMUSCULAR | Status: DC
  Administered 2022-01-14: 4 [IU] via SUBCUTANEOUS

## 2022-01-14 NOTE — Plan of Care (Signed)
  Problem: Nutritional: Goal: Maintenance of adequate nutrition will improve Outcome: Progressing   Problem: Elimination: Goal: Will not experience complications related to bowel motility Outcome: Progressing

## 2022-01-14 NOTE — Progress Notes (Signed)
PCCM interval progress note:  Notified of BG of 401 after trip to MRI. DM coordinator Tama Headings notified. Patient given 24 units of Novolog given. Glucose recheck 430 at 1559.Discussed with DM coordinator. BMP checked. Glucose 429, CO2 23, AG 11.   DM1 with hyperglycemia ?secondary to steroids and stress of MRI. -Start insulin gtt with q2h checks to avoid potential insulin stacking with decreased gfr. -Continue tube feeds. -Stopped Novolog 4 units q 4 hours, Novolog 0-20 units q 4 hours, Semglee 30 units daily while on insulin GTT -Will sign out to elink to see if patient can transition back to SQ SSI overnight.   Redmond School., MSN, APRN, AGACNP-BC [facility]  01/14/2022 , 4:56 PM  Please see [email or web address] for pager details  If no response, please call [phone] After hours, please call Elink at [phone]

## 2022-01-14 NOTE — Progress Notes (Addendum)
Physical Therapy Treatment Patient Details Name: Lauren [name] MRN: [id number] DOB: 1980/11/29 Today's Date: 01/14/2022   History of Present Illness 41 y/o female presenting 9/8 with gait instability, L-sided facial droop, weakness, and slurred speech. 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 Hospitalization complicated by URI, Rhinovirus positive, acute COPD exacerbation, elevated troponin, possibly due to cardiac injury or Takotsubo cardiomyopathy. 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    PT Comments    The pt was agreeable to session, following commands briskly with RLE in preparation for movement OOB. The pt continues to need modA of 2 to complete bed mobility and maintain static sitting balance due to posterior and L lateral lean, and required mod-maxA of 2 to complete stand-pivot transfer to recliner. Assist given through gait belt and bed pad to facilitate hip lift and extension as well as blocking of L knee and assist to advance RLE. Will continue to progress ambulation and standing balance as tolerated. Continue to recommend acute inpatient rehab when medically stable.    Recommendations for follow up therapy are one component of a multi-disciplinary discharge planning process, led by the attending physician.  Recommendations may be updated based on patient status, additional functional criteria and insurance authorization.  Follow Up Recommendations  Acute inpatient rehab (3hours/day)     Assistance Recommended at Discharge Frequent or constant Supervision/Assistance  Patient can return home with the following Assist for transportation;Assistance with cooking/housework;Two people to help with walking and/or  transfers;A lot of help with bathing/dressing/bathroom;Help with stairs or ramp for entrance   Equipment Recommendations  Other (comment) (defer to post acute)    Recommendations for Other Services       Precautions / Restrictions Precautions Precautions: Fall Precaution Comments: L inattention, R gaze preference, L hemiplegia Restrictions Weight Bearing Restrictions: No     Mobility  Bed Mobility Overal bed mobility: Needs Assistance Bed Mobility: Sidelying to Sit, Sit to Supine, Rolling   Sidelying to sit: Mod assist, +2 for physical assistance       General bed mobility comments: modA to manage movement of BLE to EOB and to elevate trunk from HOB. min-modA to maintain static sitting once at EOB (L lean)    Transfers Overall transfer level: Needs assistance Equipment used: 2 person hand held assist Transfers: Sit to/from Stand Sit to Stand: Max assist, +2 physical assistance           General transfer comment: gait belt and bed pad used; Able to come to standing with mod A +2 however Max A +2 for stand pivot; L knee buckling. assist to advance RLE. blocking of LLE due to buckling    Ambulation/Gait               General Gait Details: limited to pivotal movements with assist to advance RLE and blocking of LLE due to buckling    Modified Rankin (Stroke Patients Only) Modified Rankin (Stroke Patients Only) Pre-Morbid Rankin Score: No symptoms Modified Rankin: Severe disability     Balance Overall balance assessment: Needs assistance Sitting-balance support: Feet supported, Single extremity supported Sitting balance-Leahy Scale: Poor Sitting balance - Comments: increased posterior lean Postural control: Left lateral lean Standing balance support: Bilateral upper extremity supported Standing balance-Leahy Scale: Zero Standing balance comment:  max A +2 to stand                            Cognition Arousal/Alertness: Awake/alert Behavior  During Therapy: Restless, Flat affect Overall Cognitive Status: Difficult to assess Area of Impairment: Attention, Safety/judgement, Following commands, Problem solving, Awareness                   Current Attention Level: Sustained   Following Commands: Follows one step commands with increased time Safety/Judgement: Decreased awareness of safety, Decreased awareness of deficits   Problem Solving: Slow processing, Decreased initiation, Difficulty sequencing, Requires verbal cues, Requires tactile cues General Comments: improved attention to task and ability to follow commands        Exercises      General Comments General comments (skin integrity, edema, etc.): SpO2 in the 90s on 10L; RR in the 30s      Pertinent Vitals/Pain Pain Assessment Pain Assessment: Faces Faces Pain Scale: Hurts little more Pain Location: seems bothered by the coretrack; L foot Pain Descriptors / Indicators: Discomfort, Grimacing Pain Intervention(s): Limited activity within patient's tolerance, Monitored during session, Repositioned     PT Goals (current goals can now be found in the care plan section) Acute Rehab PT Goals Patient Stated Goal: to return home PT Goal Formulation: With family Time For Goal Achievement: 01/25/22 Potential to Achieve Goals: Good Progress towards PT goals: Progressing toward goals    Frequency    Min 4X/week      PT Plan Current plan remains appropriate    Co-evaluation PT/OT/SLP Co-Evaluation/Treatment: Yes Reason for Co-Treatment: Complexity of the patient's impairments (multi-system involvement);Necessary to address cognition/behavior during functional activity;For patient/therapist safety;To address functional/ADL transfers PT goals addressed during session: Mobility/safety with mobility;Balance;Strengthening/ROM OT goals addressed during session: ADL's and self-care      AM-PAC PT "6 Clicks" Mobility   Outcome Measure  Help needed turning  from your back to your side while in a flat bed without using bedrails?: A Lot Help needed moving from lying on your back to sitting on the side of a flat bed without using bedrails?: Total Help needed moving to and from a bed to a chair (including a wheelchair)?: Total Help needed standing up from a chair using your arms (e.g., wheelchair or bedside chair)?: Total Help needed to walk in hospital room?: Total Help needed climbing 3-5 steps with a railing? : Total 6 Click Score: 7    End of Session Equipment Utilized During Treatment: Gait belt;Oxygen Activity Tolerance: Patient limited by fatigue Patient left: in chair;with call bell/phone within reach;with chair alarm set;with family/visitor present (with SLP) Nurse Communication: Mobility status PT Visit Diagnosis: Muscle weakness (generalized) (M62.81);Hemiplegia and hemiparesis;Other abnormalities of gait and mobility (R26.89) Hemiplegia - Right/Left: Left Hemiplegia - dominant/non-dominant: Non-dominant Hemiplegia - caused by: Cerebral infarction     Time: 7530-0511 PT Time Calculation (min) (ACUTE ONLY): 27 min  Charges:  $Therapeutic Activity: 8-22 mins                     [place], PT, DPT   Acute Rehabilitation Department   Sandra Cockayne 01/14/2022, 12:20 PM

## 2022-01-14 NOTE — Consult Note (Cosign Needed Addendum)
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 Has been following podiatry postoperatively. Last visit showed some dehiscence and repeat MRI of left foot concern for osteomyelitis of fourth metatarsal. On exam, wound is healing with no drainage or surrounding erythema. Febrile overnight and continues to have leukocytosis.  -9/2 MRI of left foot concern for osteomyelitis of left fourth metatarsal head and likely of adjacent proximal phalanx, trace joint effusion of fourth MTP, postsurgical changes of partial fifth ray amputation -been on Augmentin for past month (dispensed for 28 days starting on 8/14) -was febrile overnight and WBC 22.6 -9/8 Blood culture showed NG  Recommendations:  -continue Augmentin 500-125 mg BID  -consider biopsy by podiatry to tailor antibiotic coverage    Microbiology:   Antibiotics: -Cefazolin (9/8) -Augmentin (9/9-present)  Cultures: 9/8 Blood: NG  9/9 Tracheal aspirate: normal respiratory flora   HPI: Lauren [name] is a 41 y.o. female with PMH of CAD, HFrEF, T1DM with neuropathy, CKD III, left foot diabetic ulcer s/p fifth ray amputation presents with chest pain and headache. 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   Partial left fifth ray amputation on 09/02/21 by podiatry following ulcer on left lateral foot that failed outpatient therapy. She  was placed on cefepime, vancomycin and metronidazole.   8/28 post-op follow up with podiatry showed some dehiscence with no signs of infection or drainage. Repeat MRI of left foot showed postsurgical changes of the amputation but concern for osteomyelitis of distal fourth metatarsal head. Podiatry discussed possible fourth metatarsal head resection and proximal phalangeal resection following revascularization.    Review of Systems: Review of Systems  Respiratory:  Positive for wheezing.   Musculoskeletal:        Negative for foot pain  Neurological:  Positive for speech change.    Past Medical History:  Diagnosis Date   Anemia    CAD (coronary artery disease)    a. s/p cath in 03/2014 showing 30% mid-LAD, moderate to severe disease along small D1, patent LCx, moderate to severe distal OM2 stenosis and moderate diffuse diease along RCA not amenable to PCI   CHF (congestive heart failure) ([place])    a. EF 55-60% in 12/2019 b. EF at 35-40% by echo in 05/2020   CKD (chronic kidney disease) stage 4, GFR 15-29 ml/min (HCC)    Diabetes mellitus without complication ([place])    Myocardial infarction ([place])    Neuropathy    Stroke ([place])     Social History   Tobacco Use   Smoking status: Former    Packs/day: 0.25    Types: Cigarettes    Quit date: 10/27/2020    Years since quitting: 1.2   Smokeless tobacco: Never  Vaping Use   Vaping Use: Never used  Substance Use Topics   Alcohol use: No    Alcohol/week: 0.0 standard drinks of alcohol   Drug  use: No    Family History  Problem Relation Age of Onset   Thyroid disease Mother    Hypertension Mother    Hyperlipidemia Mother    CAD Mother    CVA Mother    Hyperlipidemia Father    Hypertension Father    CAD Father    Breast cancer Paternal Grandmother 66   Cancer Paternal Grandmother        lung   Scheduled Meds:  amoxicillin-clavulanate  1 tablet Per Tube Q12H   arformoterol  15 mcg Nebulization BID   aspirin  81 mg Oral Daily    Or   aspirin  81 mg Per Tube Daily   atorvastatin  40 mg Per Tube Daily   budesonide (PULMICORT) nebulizer solution  0.5 mg Nebulization BID   Chlorhexidine Gluconate Cloth  6 each Topical Daily   docusate  100 mg Per Tube BID   feeding supplement (PROSource TF20)  60 mL Per Tube Daily   free water  300 mL Per Tube Q4H   insulin aspart  0-20 Units Subcutaneous Q4H   insulin aspart  4 Units Subcutaneous Q4H   insulin glargine-yfgn  30 Units Subcutaneous Q24H   isosorbide mononitrate  20 mg Per Tube BID   levothyroxine  75 mcg Per Tube Q0600   methylPREDNISolone (SOLU-MEDROL) injection  40 mg Intravenous Daily   metoprolol tartrate  25 mg Per Tube BID    morphine injection  1 mg Intravenous Once   nicotine  21 mg Transdermal Daily   pantoprazole  40 mg Per Tube Daily   polyethylene glycol  17 g Per Tube Daily   revefenacin  175 mcg Nebulization Daily   ticagrelor  90 mg Oral BID   Or   ticagrelor  90 mg Per Tube BID   Continuous Infusions:  feeding supplement (GLUCERNA 1.5 CAL) 1,000 mL (01/13/22 1921)   heparin 1,500 Units/hr (01/14/22 0720)   nitroGLYCERIN 90 mcg/min (01/14/22 0602)   PRN Meds:.acetaminophen **OR** acetaminophen (TYLENOL) oral liquid 160 mg/5 mL **OR** acetaminophen, diazepam, docusate sodium, ipratropium-albuterol, labetalol, mouth rinse, perflutren lipid microspheres (DEFINITY) IV suspension, polyethylene glycol, senna-docusate Allergies  Allergen Reactions   Wellbutrin [Bupropion] Hives   Cefepime Rash    Tolerates penicilllin   Ciprofloxacin Hcl Hives and Rash    Hives/rash at injection site    Tape Rash    OBJECTIVE: Blood pressure 139/76, pulse 100, temperature 100.2 F (37.9 C), temperature source Axillary, resp. rate (!) 38, height 5\' 5"  (1.651 m), weight 89.4 kg, last menstrual period 11/01/2021, SpO2 99 %.  Physical Exam Constitutional:      General: She is not in acute distress.    Appearance: She is not ill-appearing.  HENT:     Head:  Normocephalic and atraumatic.  Cardiovascular:     Rate and Rhythm: Normal rate and regular rhythm.  Pulmonary:     Breath sounds: Wheezing present.  Musculoskeletal:     Left Lower Extremity: Left leg is amputated below ankle. (partial fifth ray amputation) Feet:     Right foot:     Skin integrity: Skin integrity normal.     Left foot:     Skin integrity: Ulcer (healing ulcer with no drainage) present. No erythema.  Skin:    General: Skin is warm and dry.  Neurological:     Mental Status: She is alert.     Comments: Aphasic, will nod and able to write to communicate      Lab Results Lab Results  Component  Value Date   WBC 22.6 (H) 01/14/2022   HGB 7.7 (L) 01/14/2022   HCT 23.7 (L) 01/14/2022   MCV 92.9 01/14/2022   PLT 347 01/14/2022    Lab Results  Component Value Date   CREATININE 2.47 (H) 01/14/2022   BUN 48 (H) 01/14/2022   NA 148 (H) 01/14/2022   K 3.6 01/14/2022   CL 112 (H) 01/14/2022   CO2 25 01/14/2022    Lab Results  Component Value Date   ALT 19 01/08/2022   AST 34 01/08/2022   ALKPHOS 67 01/08/2022   BILITOT 0.5 01/08/2022   Imaging 9/2 MRI Left Foot IMPRESSION: Osteomyelitis of the distal fourth metatarsal head and likely early osteomyelitis of the adjacent proximal phalanx. Trace fourth MTP joint effusion suggesting developing septic arthritis. Adjacent soft tissue swelling without evidence of soft tissue abscess on noncontrast MRI.   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    Angelique Blonder, DO Internal Medicine PGY-1 01/14/2022, 1:40 PM

## 2022-01-14 NOTE — Inpatient Diabetes Management (Signed)
Inpatient Diabetes Program Recommendations  AACE/ADA: New Consensus Statement on Inpatient Glycemic Control (2015)  Target Ranges:  Prepandial:   less than 140 mg/dL      Peak postprandial:   less than 180 mg/dL (1-2 hours)      Critically ill patients:  140 - 180 mg/dL   Lab Results  Component Value Date   GLUCAP 430 (H) 01/14/2022   HGBA1C 8.0 (H) 01/09/2022    Review of Glycemic Control  Latest Reference Range & Units 01/14/22 00:21 01/14/22 03:58 01/14/22 08:23 01/14/22 12:00 01/14/22 13:37 01/14/22 15:59  Glucose-Capillary 70 - 99 mg/dL 182 (H) 123 (H) 196 (H) 329 (H) 401 (H) 430 (H)   Diabetes history: DM Type 1 diagnosed at the age of 23 Outpatient Diabetes medications:  Omnipod insulin pump (unsure of settings) (Prior to omnipod patient was taking Semglee 34 units daily and Novolog 4-10 units with meals) Sees Rayetta Pigg, NP in [place], [place] Current orders for Inpatient glycemic control:  Novolog 4 units q 4 hours  Novolog 0-20 units q 4 hours Semglee 30 units daily  Solumedrol 40 mg daily (for 5 doses) Glucerna at 55 ml/hr  After MRI procedure, pt was delayed Novolog insulin, had Tube Feeds running up until the time for MRI, also received Solumedrol 40 mg Daily  Inpatient Diabetes Program Recommendations:    Glucose 401 at 1337,  24 units of Novolog given. Glucose recheck 430 at 1559.  Due to renal function and potential for insulin stacking the safest thing for pt is to initiate the ICU glycemic control order set with hyperglycemia mode in Endotool for glucose control. Spoke with RN and NP at bedside. Will follow glucose trends.  Thanks,  Tama Headings RN, MSN, BC-ADM Inpatient Diabetes Coordinator Team Pager [phone] (8a-5p)

## 2022-01-14 NOTE — Progress Notes (Signed)
   PODIATRY PROGRESS NOTE  NAME Lauren [name] MRN [id number] DOB [birth year] DOA 01/08/2022   cc: osteomyelitis left 4th metatarsal  Chief Complaint  Patient presents with   Code Stroke   Request from ID for bone biospy of the left fourth metatarsal bone to help guide antibiotic coverage.  Spoke with husband, [name], on the phone regarding bone biopsy who is amenable to this plan. Will plan for bedside bone biopsy with local block tomorrow PM.   Edrick Kins, DPM [facility]  Dr. Edrick Kins, DPM    [address], [place] [address]                Office [phone]  Fax [phone]

## 2022-01-14 NOTE — Progress Notes (Signed)
Speech Language Pathology Treatment: Dysphagia;Cognitive-Linquistic  Patient Details Name: Lauren [name] MRN: [id number] DOB: 1981/03/10 Today's Date: 01/14/2022 Time: 9371-6967 SLP Time Calculation (min) (ACUTE ONLY): 30 min  Assessment / Plan / Recommendation Clinical Impression  Pt was seen for co-treatment session with PT and OT who assisted with maximizing pt's alertness and optimizing positioning. Pt was transferred to the recliner with assistance from PT and OT. Pt was alert and cooperative during the session. Pt was better able to attend to tasks, but prompts were still necessary intermittently. Pt mainly communicated using gestures and writing; however, she verbally produced "clock", "marker" and "ice water" during the session with prompts. Moderate-severe dysarthria was noted characterized by reduced vocal intensity, articulatory imprecision, and a hoarse vocal quality.   Pt eagerly accepted various boluses including ice chips, thin liquids via cup, purees, and chopped peaches. Pt demonstrated throat clearing and coughing with thin and nectar thick liquids. Coughing was also noted with purees and peaches. Pt was able to tolerate individual ice chips without overt s/s of aspiration, but exhibited throat clearing with multiple ice chips. Pt's bolus awareness is notably improved compared to prior sessions. She frequently self-fed and initiated bolus manipulation/mastication without prompts/cues. Left-sided anterior spillage was observed with liquids and puree, and residue was noted in the left lateral sulcus. Some residue was expelled during coughing and some was removed via suction. Pt's NPO status will be maintained, but with allowance of individual ice chips after oral care. SLP will continue to follow pt with anticipation that instrumental assessment will be necessary in the near future.    HPI HPI: Lauren [name] is a 41 y.o. female who presented to the ED for acute onset of chest pain  with left side weakness. CT head right MCA and vertebral stroke; underwent IR thrombectomy of R ICA with R ICA dissection repair and stent placement with TICI 3. Intubated 9/8-10.  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      SLP Plan  Continue with current plan of care      Recommendations for follow up therapy are one component of a multi-disciplinary discharge planning process, led by the attending physician.  Recommendations may be updated based on patient status, additional functional criteria and insurance authorization.    Recommendations  Diet recommendations: NPO (individual ice chips after oral care.) Medication Administration: Via alternative means Supervision: Staff to assist with self feeding;Full supervision/cueing for compensatory strategies                Oral Care Recommendations: Oral care QID;Staff/trained caregiver to provide oral care Follow Up Recommendations: Acute inpatient rehab (3hours/day) Assistance recommended at discharge: Frequent or constant Supervision/Assistance SLP Visit Diagnosis: Dysphagia, unspecified (R13.10);Cognitive communication deficit (R41.841);Dysarthria and anarthria (R47.1) Plan: Continue with current plan of care        Lauren [name] I. Hardin Negus, [place], [place] Office number [phone]    Lauren [name]  01/14/2022, 12:00 PM

## 2022-01-14 NOTE — Progress Notes (Signed)
[place] for heparin Indication: chest pain/ACS  Allergies  Allergen Reactions   Wellbutrin [Bupropion] Hives   Cefepime Rash    Tolerates penicilllin   Ciprofloxacin Hcl Hives and Rash    Hives/rash at injection site    Tape Rash    Patient Measurements: Height: 5\' 5"  (165.1 cm) Weight: 89.4 kg (197 lb 1.5 oz) IBW/kg (Calculated) : 57 Heparin Dosing Weight: 77kg  Vital Signs: Temp: 101.2 F (38.4 C) (09/14 0400) Temp Source: Axillary (09/14 0400) BP: 152/83 (09/14 0400) Pulse Rate: 116 (09/14 0400)  Labs: Recent Labs     0000 01/12/22 0027 01/12/22 0203 01/12/22 0541 01/12/22 1702 01/13/22 0429 01/13/22 0827 01/13/22 1059 01/13/22 2054 01/14/22 0556  HGB   < >  --  8.4*  --   --  8.7*  --   --   --  7.7*  HCT  --   --  24.5*  --   --  26.3*  --   --   --  23.7*  PLT  --   --  348  --   --  353  --   --   --  347  APTT  --   --   --  45*   < >  --  53* 48* 67* 63*  HEPARINUNFRC  --   --   --  0.88*  --   --  0.67  --   --  0.58  CREATININE  --   --  3.58*  --   --  2.73*  --   --   --   --   TROPONINIHS  --  3,807* 3,512*  --   --   --   --  2,880*  --   --    < > = values in this interval not displayed.     Estimated Creatinine Clearance: 30.6 mL/min (A) (by C-G formula based on SCr of 2.73 mg/dL (H)).   Assessment: 65 yoF admitted with R MCA vertebral stroke s/p thrombectomy and stent placement. Pt is on apixaban prior to admit, no anticoagulation received this admission. Pt with elevated troponins and now chest pain, pharmacy asked to dose IV heparin for ACS management. Will defer bolus and utilize lower goal in setting of recent CVA. Will check aPTT along with initial heparin level as apixaban may still be affecting level  aPTT is subtherapeutic at 63 seconds, heparin level therapeutic but likely due to influence of apixaban.  Goal of Therapy:  Heparin level 0.3-0.5 units/ml aPTT 66-85 seconds Monitor platelets  by anticoagulation protocol: Yes   Plan:  Increase heparin to 1500 units/h Recheck aPTT in 6h  Arrie Senate, PharmD, [place], [facility] Clinical Pharmacist Please check AMION for all [facility] numbers 01/14/2022

## 2022-01-14 NOTE — Progress Notes (Signed)
ABI w/ TBI study completed.   Please see CV Proc for preliminary results.   Kanyon Seibold, RDMS, RVT  

## 2022-01-14 NOTE — Consult Note (Signed)
[place]  [phone] Fax:([phone] [phone]   Reason for Referral: Stroke on anticoagulation.  Chief Complaint  Patient presents with   Code Stroke   HPI:  This is a very pleasant 41 year old female patient with prior history of uncontrolled diabetes, myocardial infarction, congestive heart failure, chronic kidney disease, coronary artery disease, stroke who is currently admitted with stroke and stroke related complications, hematology consulted to discuss anticoagulation recommendations and she appears to have a cardiovascular event while on Eliquis.  She also appears to have active osteomyelitis for which she is currently continuing antibiotics. Back in July she had acute arterial occlusion of the peripheral brachial artery just above the elbow and had surgical thrombectomy.  No personal history of DVT/PE.  She underwent work-up for cardioembolic etiology at that time had a TTE which was negative she was inpatient on heparin for anticoagulation and was eventually discharged on Eliquis.  On her current admission, she was found to have left-sided weakness, low energy as well as intermittent vision issues.  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  Cardiology, neurology and critical care following.  Given new stroke while on Eliquis, hematology was consulted for additional anticoagulation recommendations.  Patient was a bit sedated today since she had some medication for MRI.  Hence most of the history was obtained by review of records as well as discussion with Dr. Kimber Relic. Per discussion with ICU team, no personal history of pulmonary embolism.  She is continuing to improve and they are hoping to downgrade her to progressive care unit soon. Rest of the pertinent 10 point ROS reviewed and negative  Past Medical History:  Diagnosis Date   Anemia    CAD  (coronary artery disease)    a. s/p cath in 03/2014 showing 30% mid-LAD, moderate to severe disease along small D1, patent LCx, moderate to severe distal OM2 stenosis and moderate diffuse diease along RCA not amenable to PCI   CHF (congestive heart failure) ([place])    a. EF 55-60% in 12/2019 b. EF at 35-40% by echo in 05/2020   CKD (chronic kidney disease) stage 4, GFR 15-29 ml/min (HCC)    Diabetes mellitus without complication ([place])    Myocardial infarction ([place])    Neuropathy    Stroke [facility])   :   Past Surgical History:  Procedure Laterality Date   AMPUTATION Left 09/02/2021   Procedure: AMPUTATION RAY;  Surgeon: Criselda Peaches, DPM;  Location: [place];  Service: Podiatry;  Laterality: Left;  sagittal saw, 3L bag saline & Pulse   Cardiac catherization     CHOLECYSTECTOMY     I & D EXTREMITY Left 08/30/2021   Procedure: IRRIGATION AND DEBRIDEMENT WITH BONE BIOPSY;  Surgeon: [name], DPM;  Location: [place];  Service: Podiatry;  Laterality: Left;   IRRIGATION AND DEBRIDEMENT FOOT Left 09/04/2021   Procedure: IRRIGATION AND DEBRIDEMENT FOOT AND CLOSURE;  Surgeon: Criselda Peaches, DPM;  Location: [place];  Service: Podiatry;  Laterality: Left;   RADIOLOGY WITH ANESTHESIA N/A 01/08/2022   Procedure: IR WITH ANESTHESIA;  Surgeon: Radiologist, Medication, MD;  Location: [place];  Service: Radiology;  Laterality: N/A;   THROMBECTOMY BRACHIAL ARTERY Right 11/18/2021   Procedure: RIGHT BRACHIAL, RADIAL, & ULNAR ARTERY THROMBECTOMY.;  Surgeon: Marty Heck, MD;  Location: [place];  Service: Vascular;  Laterality: Right;   TUBAL LIGATION    :  Current Facility-Administered Medications  Medication Dose Route Frequency Provider Last Rate Last Admin   acetaminophen (TYLENOL) tablet 650 mg  650 mg Oral Q4H PRN Deveshwar, Willaim Rayas, MD       Or   acetaminophen (TYLENOL) 160 MG/5ML solution 650 mg  650 mg Per Tube Q4H PRN Luanne Bras, MD   650 mg at 01/14/22 0831   Or   acetaminophen  (TYLENOL) suppository 650 mg  650 mg Rectal Q4H PRN Deveshwar, Willaim Rayas, MD       amoxicillin-clavulanate (AUGMENTIN) 500-125 MG per tablet 500 mg  1 tablet Per Tube Q12H Olalere, Adewale A, MD   500 mg at 01/14/22 1030   arformoterol (BROVANA) nebulizer solution 15 mcg  15 mcg Nebulization BID Ollis, Brandi L, NP   15 mcg at 01/14/22 6283   aspirin chewable tablet 81 mg  81 mg Oral Daily Deveshwar, Sanjeev, MD       Or   aspirin chewable tablet 81 mg  81 mg Per Tube Daily Deveshwar, Sanjeev, MD   81 mg at 01/14/22 1030   atorvastatin (LIPITOR) tablet 40 mg  40 mg Per Tube Daily Candee Furbish, MD   40 mg at 01/14/22 1030   budesonide (PULMICORT) nebulizer solution 0.5 mg  0.5 mg Nebulization BID Ollis, Brandi L, NP   0.5 mg at 01/14/22 0739   Chlorhexidine Gluconate Cloth 2 % PADS 6 each  6 each Topical Daily Mannam, Praveen, MD   6 each at 01/13/22 0950   dextrose 50 % solution 0-50 mL  0-50 mL Intravenous PRN Estill Cotta, NP       docusate (COLACE) 50 MG/5ML liquid 100 mg  100 mg Per Tube BID Candee Furbish, MD   100 mg at 01/14/22 1030   docusate sodium (COLACE) capsule 100 mg  100 mg Oral BID PRN Paulita Fujita B, NP       feeding supplement (GLUCERNA 1.5 CAL) liquid 1,000 mL  1,000 mL Per Tube Continuous Mannam, Praveen, MD 55 mL/hr at 01/13/22 1921 1,000 mL at 01/13/22 1921   feeding supplement (PROSource TF20) liquid 60 mL  60 mL Per Tube Daily Ollis, Brandi L, NP   60 mL at 01/14/22 1031   free water 300 mL  300 mL Per Tube Q4H Estill Cotta, NP   300 mL at 01/14/22 1345   heparin ADULT infusion 100 units/mL (25000 units/247mL)  1,600 Units/hr Intravenous Continuous Henri Medal, RPH 15 mL/hr at 01/14/22 0720 1,500 Units/hr at 01/14/22 0720   insulin regular, human (MYXREDLIN) 100 units/ 100 mL infusion   Intravenous Continuous Estill Cotta, NP 10 mL/hr at 01/14/22 1649 10 Units/hr at 01/14/22 1649   ipratropium-albuterol (DUONEB) 0.5-2.5 (3) MG/3ML nebulizer solution 3 mL   3 mL Nebulization Q4H PRN Mannam, Praveen, MD   3 mL at 01/14/22 0257   isosorbide mononitrate (ISMO) tablet 20 mg  20 mg Per Tube BID Mannam, Praveen, MD   20 mg at 01/14/22 1030   labetalol (NORMODYNE) injection 10 mg  10 mg Intravenous Q2H PRN Bhagat, Srishti L, MD   10 mg at 01/12/22 0137   levothyroxine (SYNTHROID) tablet 75 mcg  75 mcg Per Tube Q0600 Bhagat, Srishti L, MD   75 mcg at 01/14/22 0551   methylPREDNISolone sodium succinate (SOLU-MEDROL) 40 mg/mL injection 40 mg  40 mg Intravenous Daily Ollis, Brandi L, NP   40 mg at 01/14/22 1030   metoprolol tartrate (LOPRESSOR) tablet 25 mg  25 mg Per Tube BID  Tobb, Kardie, DO   25 mg at 01/14/22 1031   nicotine (NICODERM CQ - dosed in mg/24 hours) patch 21 mg  21 mg Transdermal Daily Ollis, Brandi L, NP   21 mg at 01/14/22 1031   nitroGLYCERIN 50 mg in dextrose 5 % 250 mL (0.2 mg/mL) infusion  0-200 mcg/min Intravenous Titrated Warren Danes, MD 27 mL/hr at 01/14/22 0602 90 mcg/min at 01/14/22 0602   Oral care mouth rinse  15 mL Mouth Rinse PRN Elsie Lincoln, MD   15 mL at 01/10/22 2034   pantoprazole (PROTONIX) 2 mg/mL oral suspension 40 mg  40 mg Per Tube Daily Henri Medal, RPH   40 mg at 01/14/22 1032   polyethylene glycol (MIRALAX / GLYCOLAX) packet 17 g  17 g Oral Daily PRN Paulita Fujita B, NP       polyethylene glycol (MIRALAX / GLYCOLAX) packet 17 g  17 g Per Tube Daily Candee Furbish, MD   17 g at 01/14/22 1030   revefenacin (YUPELRI) nebulizer solution 175 mcg  175 mcg Nebulization Daily Ollis, Brandi L, NP   175 mcg at 01/14/22 0738   senna-docusate (Senokot-S) tablet 1 tablet  1 tablet Oral QHS PRN Bhagat, Srishti L, MD       ticagrelor (BRILINTA) tablet 90 mg  90 mg Oral BID Luanne Bras, MD       Or   ticagrelor (BRILINTA) tablet 90 mg  90 mg Per Tube BID Luanne Bras, MD   90 mg at 01/14/22 1030      Allergies  Allergen Reactions   Wellbutrin [Bupropion] Hives   Cefepime Rash    Tolerates penicilllin    Ciprofloxacin Hcl Hives and Rash    Hives/rash at injection site    Tape Rash  :   Family History  Problem Relation Age of Onset   Thyroid disease Mother    Hypertension Mother    Hyperlipidemia Mother    CAD Mother    CVA Mother    Hyperlipidemia Father    Hypertension Father    CAD Father    Breast cancer Paternal Grandmother 33   Cancer Paternal Grandmother        lung  :   Social History   Socioeconomic History   Marital status: Married    Spouse name: [name]   Number of children: Not on file   Years of education: Not on file   Highest education level: Not on file  Occupational History    Comment: Full time   Occupation: CNA at [employer] Use   Smoking status: Former    Packs/day: 0.25    Types: Cigarettes    Quit date: 10/27/2020    Years since quitting: 1.2   Smokeless tobacco: Never  Vaping Use   Vaping Use: Never used  Substance and Sexual Activity   Alcohol use: No    Alcohol/week: 0.0 standard drinks of alcohol   Drug use: No   Sexual activity: Yes    Birth control/protection: Surgical    Comment: tubal   Other Topics Concern   Not on file  Social History Narrative   Lives with husband and kids   Right handed   Drinks 9+ cups caffeine daily   Social Determinants of Health   Financial Resource Strain: High Risk (01/14/2022)   Overall Financial Resource Strain (CARDIA)    Difficulty of Paying Living Expenses: Hard  Food Insecurity: Food Insecurity Present (01/14/2022)   Hunger Vital Sign    Worried  About Running Out of Food in the Last Year: Sometimes true    Ran Out of Food in the Last Year: Never true  Transportation Needs: No Transportation Needs (12/22/2021)   PRAPARE - Hydrologist (Medical): No    Lack of Transportation (Non-Medical): No  Physical Activity: Insufficiently Active (07/23/2020)   Exercise Vital Sign    Days of Exercise per Week: 1 day    Minutes of Exercise per Session: 30 min  Stress: Stress  Concern Present (12/22/2021)   [place]    Feeling of Stress : To some extent  Social Connections: Moderately Integrated (12/15/2021)   Social Connection and Isolation Panel [NHANES]    Frequency of Communication with Friends and Family: More than three times a week    Frequency of Social Gatherings with Friends and Family: More than three times a week    Attends Religious Services: Never    Marine scientist or Organizations: Yes    Attends Archivist Meetings: 1 to 4 times per year    Marital Status: Married  Human resources officer Violence: Not At Risk (12/09/2021)   Humiliation, Afraid, Rape, and Kick questionnaire    Fear of Current or Ex-Partner: No    Emotionally Abused: No    Physically Abused: No    Sexually Abused: No    Exam: Patient Vitals for the past 24 hrs:  BP Temp Temp src Pulse Resp SpO2 Weight  01/14/22 1200 -- 98.6 F (37 C) Axillary -- -- -- --  01/14/22 1031 139/76 -- -- 100 -- -- --  01/14/22 0800 -- 100.2 F (37.9 C) Axillary -- -- -- --  01/14/22 0739 -- -- -- -- -- 99 % --  01/14/22 0738 -- -- -- -- -- 99 % --  01/14/22 0500 -- -- -- -- -- -- 197 lb 1.5 oz (89.4 kg)  01/14/22 0400 (!) 152/83 (!) 101.2 F (38.4 C) Axillary (!) 116 (!) 38 95 % --  01/14/22 0257 -- -- -- -- -- 99 % --  01/14/22 0000 (!) 150/82 99.8 F (37.7 C) Axillary 96 -- 94 % --  01/13/22 2300 (!) 150/91 -- -- 97 -- 95 % --  01/13/22 2200 (!) 142/89 -- -- 98 (!) 30 96 % --  01/13/22 2100 (!) 140/88 -- -- (!) 102 (!) 23 94 % --  01/13/22 2032 -- -- -- -- -- 99 % --  01/13/22 2000 (!) 147/93 99.7 F (37.6 C) Axillary 99 (!) 28 98 % --    Physical Exam Constitutional:      Appearance: She is ill-appearing.  Pulmonary:     Breath sounds: Wheezing (Scattered all over anterior lung fields) present.  Abdominal:     General: Abdomen is flat. There is no distension.  Musculoskeletal:        General: No  swelling.  Lymphadenopathy:     Cervical: No cervical adenopathy.  Skin:    General: Skin is warm and dry.     Lab Results  Component Value Date   WBC 22.6 (H) 01/14/2022   HGB 7.7 (L) 01/14/2022   HCT 23.7 (L) 01/14/2022   PLT 347 01/14/2022   GLUCOSE 429 (H) 01/14/2022   CHOL 85 01/09/2022   TRIG 154 (H) 01/09/2022   TRIG 156 (H) 01/09/2022   HDL 15 (L) 01/09/2022   LDLDIRECT 84.9 01/01/2020   LDLCALC 39 01/09/2022   ALT 19 01/08/2022   AST  34 01/08/2022   NA 142 01/14/2022   K 5.2 (H) 01/14/2022   CL 108 01/14/2022   CREATININE 2.48 (H) 01/14/2022   BUN 54 (H) 01/14/2022   CO2 23 01/14/2022    ECHOCARDIOGRAM COMPLETE  Result Date: 01/14/2022    ECHOCARDIOGRAM REPORT   Patient Name:   SHYANNE MCCLARY Date of Exam: 01/14/2022 Medical Rec #:  [id number]       Height:       65.0 in Accession #:    [id number]      Weight:       197.1 lb Date of Birth:  March 31, 1981       BSA:          1.966 m Patient Age:    66 years        BP:           139/76 mmHg Patient Gender: F               HR:           87 bpm. Exam Location:  Inpatient Procedure: 2D Echo, Color Doppler, Cardiac Doppler and Intracardiac            Opacification Agent Indications:    Dilated Cardiomyopathy  History:        Patient has prior history of Echocardiogram examinations, most                 recent 01/10/2022. CHF, CAD, Stroke; Risk Factors:Diabetes.  Sonographer:    Memory Argue Referring Phys: [id number] [place]  1. Left ventricular ejection fraction, by estimation, is 35 to 40%. Left ventricular ejection fraction by 2D MOD biplane is 37.1 %. The left ventricle has moderately decreased function. The left ventricle demonstrates regional wall motion abnormalities (see scoring diagram/findings for description). There is mild left ventricular hypertrophy. Left ventricular diastolic parameters are consistent with Grade II diastolic dysfunction (pseudonormalization). Elevated left ventricular end-diastolic  pressure. The E/e' is 42. Wall motion abnormality suggestive of LAD territory infarct or less likely Takatsubo cardiomyopathy.  2. Right ventricular systolic function is normal. The right ventricular size is normal. Tricuspid regurgitation signal is inadequate for assessing PA pressure.  3. The mitral valve is abnormal. Trivial mitral valve regurgitation.  4. The inferior vena cava is normal in size with <50% respiratory variability, suggesting right atrial pressure of 8 mmHg.  5. The aortic valve is tricuspid. Aortic valve regurgitation is not visualized. Comparison(s): Changes from prior study are noted. 01/10/2022: LVEF 30-35%, mid to apical ventricular wall thinning and severe hypokinesis to akinesis. FINDINGS  Left Ventricle: No mural thrombus with Definity contrast. Left ventricular ejection fraction, by estimation, is 35 to 40%. Left ventricular ejection fraction by 2D MOD biplane is 37.1 %. The left ventricle has moderately decreased function. The left ventricle demonstrates regional wall motion abnormalities. Definity contrast agent was given IV to delineate the left ventricular endocardial borders. The left ventricular internal cavity size was normal in size. There is mild left ventricular hypertrophy. Left ventricular diastolic parameters are consistent with Grade II diastolic dysfunction (pseudonormalization). Elevated left ventricular end-diastolic pressure. The E/e' is 72.  LV Wall Scoring: The mid and distal anterior wall, mid and distal lateral wall, mid and distal anterior septum, entire apex, mid and distal inferior wall, mid anterolateral segment, and mid inferoseptal segment are hypokinetic. The basal anteroseptal segment, basal inferolateral segment, basal anterolateral segment, basal anterior segment, basal inferior segment, and basal inferoseptal segment are normal. Right Ventricle: The right ventricular size is  normal. No increase in right ventricular wall thickness. Right ventricular systolic  function is normal. Tricuspid regurgitation signal is inadequate for assessing PA pressure. Left Atrium: Left atrial size was normal in size. Right Atrium: Right atrial size was normal in size. Pericardium: Trivial pericardial effusion is present. The pericardial effusion is posterior to the left ventricle. Mitral Valve: The mitral valve is abnormal. There is mild thickening of the posterior and anterior mitral valve leaflet(s). Trivial mitral valve regurgitation. Tricuspid Valve: The tricuspid valve is grossly normal. Tricuspid valve regurgitation is trivial. Aortic Valve: The aortic valve is tricuspid. Aortic valve regurgitation is not visualized. Aortic valve mean gradient measures 3.0 mmHg. Aortic valve peak gradient measures 6.2 mmHg. Aortic valve area, by VTI measures 2.65 cm. Pulmonic Valve: The pulmonic valve was normal in structure. Pulmonic valve regurgitation is not visualized. Aorta: The aortic root and ascending aorta are structurally normal, with no evidence of dilitation. Venous: The inferior vena cava is normal in size with less than 50% respiratory variability, suggesting right atrial pressure of 8 mmHg. IAS/Shunts: No atrial level shunt detected by color flow Doppler.  LEFT VENTRICLE PLAX 2D                        Biplane EF (MOD) LVIDd:         4.83 cm         LV Biplane EF:   Left LVIDs:         3.55 cm                          ventricular LV PW:         1.33 cm                          ejection LV IVS:        1.27 cm                          fraction by LVOT diam:     2.00 cm                          2D MOD LV SV:         72                               biplane is LV SV Index:   37                               37.1 %. LVOT Area:     3.14 cm                                Diastology                                LV e' medial:    5.11 cm/s LV Volumes (MOD)               LV E/e' medial:  21.5 LV vol d, MOD    193.0 ml      LV e' lateral:   6.20 cm/s A2C:  LV E/e'  lateral: 17.7 LV vol d, MOD    208.0 ml A4C: LV vol s, MOD    113.0 ml A2C: LV vol s, MOD    131.0 ml A4C: LV SV MOD A2C:   80.0 ml LV SV MOD A4C:   208.0 ml LV SV MOD BP:    77.1 ml RIGHT VENTRICLE RV S prime:     12.10 cm/s LEFT ATRIUM             Index LA diam:        3.95 cm 2.01 cm/m LA Vol (A2C):   66.5 ml 33.83 ml/m LA Vol (A4C):   42.7 ml 21.72 ml/m LA Biplane Vol: 54.7 ml 27.83 ml/m  AORTIC VALVE AV Area (Vmax):    2.51 cm AV Area (Vmean):   2.50 cm AV Area (VTI):     2.65 cm AV Vmax:           124.00 cm/s AV Vmean:          86.000 cm/s AV VTI:            0.271 m AV Peak Grad:      6.2 mmHg AV Mean Grad:      3.0 mmHg LVOT Vmax:         99.20 cm/s LVOT Vmean:        68.300 cm/s LVOT VTI:          0.229 m LVOT/AV VTI ratio: 0.85 MITRAL VALVE MV Area (PHT): 4.63 cm     SHUNTS MV Decel Time: 164 msec     Systemic VTI:  0.23 m MR Peak grad: 46.2 mmHg     Systemic Diam: 2.00 cm MR Vmax:      340.00 cm/s MV E velocity: 110.00 cm/s MV A velocity: 50.90 cm/s MV E/A ratio:  2.16 Lyman Bishop MD Electronically signed by Lyman Bishop MD Signature Date/Time: 01/14/2022/2:49:57 PM    Final    MR BRAIN WO CONTRAST  Result Date: 01/14/2022 CLINICAL DATA:  Neuro deficit, acute, stroke suspected. 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 EXAM: MRI HEAD WITHOUT CONTRAST TECHNIQUE: Multiplanar, multiecho pulse sequences of the brain and surrounding structures were obtained without intravenous contrast. COMPARISON:  Head CT 01/11/2022 and MRI 01/09/2022 FINDINGS: The examination had to be discontinued prior to completion due to patient condition. Axial and coronal diffusion, axial T2* gradient echo, and axial T2 FLAIR sequences were obtained. The FLAIR sequence is severely motion degraded. Numerous patchy infarcts throughout the right MCA territory are unchanged in extent compared to the prior MRI, with the most confluent regions of infarct again noted to be near  watershed areas. Small infarcts in the corpus callosum, right cerebral peduncle, and left frontal white matter are also unchanged. No definite new infarct or sizable intracranial hemorrhage is identified. There is no midline shift or hydrocephalus. Chronic white matter disease involving the cerebral hemispheres and brainstem are not well evaluated on this truncated, motion degraded study. IMPRESSION: 1. Motion degraded, incomplete examination. 2. Unchanged numerous infarcts as above, primarily involving the right MCA territory. 3. No definite new intracranial abnormality. Electronically Signed   By: [name] M.D.   On: 01/14/2022 13:36   DG Abd Portable 1V  Result Date: 01/12/2022 CLINICAL DATA:  Feeding tube placement. EXAM: PORTABLE ABDOMEN - 1 VIEW COMPARISON:  01/10/2022 FINDINGS: Cortrak feeding tube placement with the tip in the region of the distal stomach or proximal duodenum. Visualized bowel gas pattern is  nonobstructive. IMPRESSION: Feeding tube tip in the region of the distal stomach/proximal duodenum. Electronically Signed   By: Aletta Edouard M.D.   On: 01/12/2022 11:11   CT HEAD WO CONTRAST (5MM)  Result Date: 01/11/2022 CLINICAL DATA:  Follow-up stroke. Revascularization of cervical ICA occlusion. EXAM: CT HEAD WITHOUT CONTRAST TECHNIQUE: Contiguous axial images were obtained from the base of the skull through the vertex without intravenous contrast. RADIATION DOSE REDUCTION: This exam was performed according to the departmental dose-optimization program which includes automated exposure control, adjustment of the mA and/or kV according to patient size and/or use of iterative reconstruction technique. COMPARISON:  CT and MRI studies most recently 01/09/2022 FINDINGS: Brain: No abnormality seen affecting the brainstem or cerebellum. Left cerebral hemisphere appears normal by CT. Multiple widely scattered foci of low density present within the right cerebral hemisphere primarily throughout  the right middle cerebral artery territory consistent with innumerable small infarctions. No large regional confluent infarction. Mild generalized swelling. No hemorrhage. No midline shift. Vascular: Right ICA stent in place. Vascular calcification premature for age. Skull: Normal Sinuses/Orbits: Inflammatory changes of the paranasal sinuses. Orbits negative. Other: None IMPRESSION: Patchy low-density throughout the right middle cerebral artery territory consistent with the innumerable small infarction shown by MRI. No large confluent infarction. No evidence of hemorrhage or mass effect. Electronically Signed   By: Nelson Chimes M.D.   On: 01/11/2022 16:15   DG CHEST PORT 1 VIEW  Result Date: 01/11/2022 CLINICAL DATA:  Acute respiratory distress EXAM: PORTABLE CHEST 1 VIEW COMPARISON:  01/09/2022 FINDINGS: No significant change in AP portable chest radiograph. Cardiomegaly with mild, diffuse bilateral interstitial opacity and probable small, layering pleural effusions. No new or focal airspace opacity. Endotracheal tube tip position is generally obscured by overlying esophagogastric tube but appears appropriate over the midtrachea. Esophagogastric tube with tip and side port below the diaphragm. IMPRESSION: 1. No significant change in AP portable chest radiograph. Cardiomegaly with mild, diffuse bilateral interstitial opacity and probable small, layering pleural effusions most likely edema. No new or focal airspace opacity. 2. Endotracheal tube tip position is generally obscured by overlying esophagogastric tube but appears appropriate over the midtrachea. Electronically Signed   By: Delanna Ahmadi M.D.   On: 01/11/2022 11:19   DG Abd Portable 1V  Result Date: 01/10/2022 CLINICAL DATA:  NG tube placement EXAM: PORTABLE ABDOMEN - 1 VIEW COMPARISON:  None Available. FINDINGS: Enteric tube terminates in the gastric antrum. Nonobstructive bowel gas pattern. Cholecystectomy clips. IMPRESSION: Enteric tube  terminates in the gastric antrum. Electronically Signed   By: Julian Hy M.D.   On: 01/10/2022 19:06   US RENAL  Result Date: 01/10/2022 CLINICAL DATA:  Renal dysfunction EXAM: RENAL / URINARY TRACT ULTRASOUND COMPLETE COMPARISON:  01/01/2020 FINDINGS: Right Kidney: Renal measurements: 9.2 x 4.8 x 6.1 cm = volume: 141.7 mL. There is no hydronephrosis. There is increased cortical echogenicity. Left Kidney: Renal measurements: Lennie Odor by 6.2 x 4.8 cm = volume: 171.8 mL. There is no hydronephrosis. There is increased cortical echogenicity. Bladder: Foley catheter is seen in the bladder. Urinary bladder is not distended and not adequately evaluated. Other: None. IMPRESSION: There is no hydronephrosis. Increased cortical echogenicity in the kidneys suggests medical renal disease. Electronically Signed   By: Elmer Picker M.D.   On: 01/10/2022 15:17   ECHOCARDIOGRAM COMPLETE BUBBLE STUDY  Result Date: 01/10/2022    ECHOCARDIOGRAM REPORT   Patient Name:   DESHANAE LINDO Date of Exam: 01/10/2022 Medical Rec #:  [id number]  Height:       65.0 in Accession #:    [id number]      Weight:       209.0 lb Date of Birth:  1980/11/30       BSA:          2.015 m Patient Age:    59 years        BP:           121/65 mmHg Patient Gender: F               HR:           107 bpm. Exam Location:  Inpatient Procedure: 2D Echo, Color Doppler, Cardiac Doppler and Saline Contrast Bubble            Study Indications:     Stroke  History:         Patient has prior history of Echocardiogram examinations, most                  recent 11/19/2021. CHF, CAD, Stroke; Risk Factors:Diabetes.  Sonographer:     Memory Argue Sonographer#2:   Johny Chess RDCS Referring Phys:  [id number] Lorenza Chick Diagnosing Phys: Gwyndolyn Kaufman MD IMPRESSIONS  1. There is akinesis of the mid-to-apical septal LV segments, all apical segments, and apex. There is significant thinning of the mid-to-apical septal segments and apex likely  represents scarring/infarct. The basal-to-mid inferior and basal septal segments are hypokinetic.  2. No LV thrombus visualized on definity imaging.  3. Left ventricular ejection fraction, by estimation, is 30 to 35%. The left ventricle has moderately decreased function. The left ventricle demonstrates regional wall motion abnormalities (see scoring diagram/findings for description). The left ventricular internal cavity size was mildly-to-moderately dilated. There is mild concentric left ventricular hypertrophy. Indeterminate diastolic filling due to E-A fusion.  4. Right ventricular systolic function is normal. The right ventricular size is normal.  5. The mitral valve is normal in structure. Trivial mitral valve regurgitation.  6. The aortic valve is tricuspid. Aortic valve regurgitation is not visualized. No aortic stenosis is present.  7. The inferior vena cava is dilated in size with >50% respiratory variability, suggesting right atrial pressure of 8 mmHg.  8. Agitated saline contrast bubble study was negative, with no evidence of any interatrial shunt. Comparison(s): Compared to prior TTE on 10/2021, the EF has dropped to 30-35% with similar wall motion. FINDINGS  Left Ventricle: There is akinesis of the mid-to-apical septal LV segments, all apical segments, and apex. There is significant thinning of the mid-to-apical septal segments and apex which likely represents scarring/infarct. The basal-to-mid inferior and  basal septal segments are hypokinetic. No LV thrombus visualized. Left ventricular ejection fraction, by estimation, is 30 to 35%. The left ventricle has moderately decreased function. The left ventricle demonstrates regional wall motion abnormalities. Definity contrast agent was given IV to delineate the left ventricular endocardial borders. The left ventricular internal cavity size was mildly to moderately dilated. There is mild concentric left ventricular hypertrophy. Indeterminate diastolic  filling  due to E-A fusion. Right Ventricle: The right ventricular size is normal. No increase in right ventricular wall thickness. Right ventricular systolic function is normal. Left Atrium: Left atrial size was normal in size. Right Atrium: Right atrial size was normal in size. Pericardium: There is no evidence of pericardial effusion. Mitral Valve: The mitral valve is normal in structure. Trivial mitral valve regurgitation. Tricuspid Valve: The tricuspid valve is normal in structure. Tricuspid valve regurgitation is trivial.  Aortic Valve: The aortic valve is tricuspid. Aortic valve regurgitation is not visualized. No aortic stenosis is present. Aortic valve mean gradient measures 4.0 mmHg. Aortic valve peak gradient measures 7.4 mmHg. Aortic valve area, by VTI measures 2.86 cm. Pulmonic Valve: The pulmonic valve was normal in structure. Pulmonic valve regurgitation is trivial. Aorta: The aortic root is normal in size and structure. Venous: The inferior vena cava is dilated in size with greater than 50% respiratory variability, suggesting right atrial pressure of 8 mmHg. IAS/Shunts: The atrial septum is grossly normal. Agitated saline contrast was given intravenously to evaluate for intracardiac shunting. Agitated saline contrast bubble study was negative, with no evidence of any interatrial shunt.  LEFT VENTRICLE PLAX 2D LVIDd:         4.80 cm LVIDs:         3.40 cm LV PW:         1.00 cm LV IVS:        1.10 cm LVOT diam:     2.00 cm LV SV:         57 LV SV Index:   29 LVOT Area:     3.14 cm  LV Volumes (MOD) LV vol d, MOD A2C: 170.0 ml LV vol d, MOD A4C: 112.0 ml LV vol s, MOD A2C: 108.0 ml LV vol s, MOD A4C: 61.0 ml LV SV MOD A2C:     62.0 ml LV SV MOD A4C:     112.0 ml LV SV MOD BP:      56.6 ml RIGHT VENTRICLE RV S prime:     14.10 cm/s LEFT ATRIUM             Index        RIGHT ATRIUM           Index LA diam:        4.00 cm 1.98 cm/m   RA Area:     10.40 cm LA Vol (A2C):   60.2 ml 29.87 ml/m  RA Volume:    18.80 ml  9.33 ml/m LA Vol (A4C):   43.2 ml 21.43 ml/m LA Biplane Vol: 51.0 ml 25.30 ml/m  AORTIC VALVE AV Area (Vmax):    2.82 cm AV Area (Vmean):   2.71 cm AV Area (VTI):     2.86 cm AV Vmax:           136.00 cm/s AV Vmean:          94.500 cm/s AV VTI:            0.201 m AV Peak Grad:      7.4 mmHg AV Mean Grad:      4.0 mmHg LVOT Vmax:         122.00 cm/s LVOT Vmean:        81.600 cm/s LVOT VTI:          0.183 m LVOT/AV VTI ratio: 0.91  AORTA Ao Root diam: 2.90 cm MR Peak grad: 53.3 mmHg MR Vmax:      365.00 cm/s SHUNTS                           Systemic VTI:  0.18 m                           Systemic Diam: 2.00 cm Gwyndolyn Kaufman MD Electronically signed by Gwyndolyn Kaufman MD Signature Date/Time: 01/10/2022/2:00:22 PM    Final (Updated)  MR ANGIO HEAD WO CONTRAST  Result Date: 01/09/2022 CLINICAL DATA:  Status post revascularization of occluded extracranial ICA. EXAM: MRA HEAD WITHOUT CONTRAST TECHNIQUE: Angiographic images of the Circle of Willis were acquired using MRA technique without intravenous contrast. COMPARISON:  Cerebral arteriogram 01/08/2022 FINDINGS: Anterior circulation: Segmental signal is present in the high cervical segment of the ICA. No signal is visualized in the petrous are pre cavernous right ICA. Flow signal is present in the cavernous and supraclinoid right ICA as well as the posterior communicating artery. More normal flow is present in the high left cervical ICA. Signal loss in the supraclinoid left ICA may be artifactual. ICA terminus signal is within normal limits. The left A1 segment is dominant. Left M1 segment is normal. Flow is present in the right M1 segment. The MCA branch vessels are intact bilaterally vocal robust on the left. ACA branch vessels are within normal limits bilaterally. Posterior circulation: The left vertebral artery is the dominant vessel. No flow is present in the right vertebral artery below the PICA. The basilar artery is normal. The left  posterior cerebral artery originates from the basilar tip. The right posterior cerebral artery is of fetal type. The right P1 segment is present. PCA branch vessels are within normal limits bilaterally. Anatomic variants: Fetal type right posterior cerebral artery. Other: None. IMPRESSION: 1. Abnormal flow signal within the petrous and pre cavernous right ICA may represent artifact from stent or slow flow 2. Signal loss in the supraclinoid left ICA may be artifactual. 3. Asymmetric diffused decreased size of right MCA branch vessels compared to the left likely reflects decreased perfusion. No significant proximal stenosis or occlusion. Electronically Signed   By: San Morelle M.D.   On: 01/09/2022 17:17   MR BRAIN WO CONTRAST  Result Date: 01/09/2022 CLINICAL DATA:  Status post revascularization of occluded cervical ICA. EXAM: MRI HEAD WITHOUT CONTRAST TECHNIQUE: Multiplanar, multiecho pulse sequences of the brain and surrounding structures were obtained without intravenous contrast. COMPARISON:  CT head without contrast 01/08/2022 FINDINGS: Brain: Diffusion-weighted images demonstrate extensive patchy restricted diffusion throughout the MCA territory. Most confluent areas are along the watershed. There is significant sparing of cortex within the right MCA territory although scattered foci of acute infarct are noted. T2 and FLAIR hyperintensities are associated with the areas of acute infarction. Most consistent cortical infarct is in the anterior right frontal lobe along the watershed. Focal nonhemorrhagic infarct is also present within the genu of the corpus callosum and more posteriorly along the body of the corpus callosum on the right. No acute hemorrhage is present. White matter changes extend into the brainstem. The internal auditory canals are within normal limits. Cerebellum is within normal limits. Vascular: Intravascular contrast is present in the left internal carotid artery but not the right.  This likely represents T1 shortening from height noted contrast. Branch vessels are seen in the [place] territory and left MCA territory but not on the right. Flow is present in the posterior circulation. Skull and upper cervical spine: The craniocervical junction is normal. Upper cervical spine is within normal limits. Marrow signal is unremarkable. Sinuses/Orbits: Fluid levels are present the maxillary sinuses and throughout the ethmoid air cells. Mild mucosal thickening is present in the frontal sinuses bilaterally. Fluid levels are present in the sphenoid sinuses. Small mastoid effusions are present. The globes and orbits are within normal limits. IMPRESSION: 1. Extensive patchy areas of acute nonhemorrhagic infarction involving the right MCA territory, most confluent areas along the watershed. 2. Focal nonhemorrhagic  infarct involving the genu of the corpus callosum and more posteriorly along the body of the corpus callosum on the right. 3. Diffuse sinus disease is likely secondary to intubation. Electronically Signed   By: San Morelle M.D.   On: 01/09/2022 17:09   DG Abd Portable 1V  Result Date: 01/09/2022 CLINICAL DATA:  Orogastric tube placement EXAM: PORTABLE ABDOMEN - 1 VIEW COMPARISON:  CT abdomen pelvis 06/23/2014 FINDINGS: Orogastric tube terminates in the region of the gastric antrum. Mild left basilar atelectasis. No dilated loops of bowel identified within the visualized upper abdomen. Central venous catheter tip located at the cavoatrial junction. IMPRESSION: Orogastric tube terminates in the region of the gastric antrum. Electronically Signed   By: Miachel Roux M.D.   On: 01/09/2022 10:07   DG Chest Port 1 View  Result Date: 01/09/2022 CLINICAL DATA:  Endotracheal tube placement. EXAM: PORTABLE CHEST 1 VIEW COMPARISON:  01/09/2022 earlier. FINDINGS: Endotracheal tube has tip 4.7 cm above the carina. Left IJ central venous catheter unchanged with tip at the cavoatrial junction. Lungs are  hypoinflated with persistent hazy right perihilar/paramediastinal opacification and mild prominence of the central pulmonary vasculature. Stable mild opacification in the left base/retrocardiac region. No pneumothorax. Cardiomediastinal silhouette and remainder of the exam is unchanged. IMPRESSION: 1. Stable hazy opacification over the right perihilar/paramediastinal region and left base/retrocardiac region. Findings may be due to asymmetric edema versus infection. Suggestion of mild vascular congestion. 2. Tubes and lines as described. Electronically Signed   By: Marin Olp M.D.   On: 01/09/2022 09:21   DG CHEST PORT 1 VIEW  Result Date: 01/09/2022 CLINICAL DATA:  Status post intubation. EXAM: PORTABLE CHEST 1 VIEW COMPARISON:  Chest radiograph dated 01/08/2021. FINDINGS: Interval retraction of the endotracheal tube with tip now approximately 4 cm above the carina. Left IJ central venous line in similar position. Similar or slightly worsened scratch pulmonary hazy and streaky densities, likely combination of atelectasis and vascular congestion and edema. Pneumonia is not excluded. Trace left pleural effusion suspected. No pneumothorax. No acute osseous pathology. IMPRESSION: Interval retraction of the endotracheal tube with tip now approximately 4 cm above the carina. Similar or slightly worsened pulmonary opacities in the right upper lobe compared to the earlier radiograph. Electronically Signed   By: Anner Crete M.D.   On: 01/09/2022 01:04   CT HEAD WO CONTRAST (5MM)  Result Date: 01/08/2022 CLINICAL DATA:  Stroke follow-up EXAM: CT HEAD WITHOUT CONTRAST TECHNIQUE: Contiguous axial images were obtained from the base of the skull through the vertex without intravenous contrast. RADIATION DOSE REDUCTION: This exam was performed according to the departmental dose-optimization program which includes automated exposure control, adjustment of the mA and/or kV according to patient size and/or use of  iterative reconstruction technique. COMPARISON:  01/08/2022 CT head FINDINGS: Brain: Redemonstrated hypodensity in the right frontal periventricular white matter. No additional hypodensity. No evidence of acute hemorrhage, mass, mass effect, or midline shift. No hydrocephalus or extra-axial fluid collection. Vascular: No hyperdense vessel. Skull: Normal. Negative for fracture or focal lesion. Sinuses/Orbits: Mucosal thickening throughout the paranasal sinuses, with air-fluid levels with bubbly fluid in the maxillary sinuses and right sphenoid sinus. Fluid in the nasopharynx is likely related to intubation. The orbits are unremarkable. Other: The mastoid air cells are well aerated. IMPRESSION: 1. Redemonstrated hypodensity in the right frontal periventricular white matter. No additional acute intracranial process. No additional acute infarct or hemorrhage. 2. Air-fluid levels with bubbly fluid in the maxillary sinuses and right sphenoid sinus are likely related  to intubation but can be seen in the setting of sinusitis. Electronically Signed   By: Merilyn Baba M.D.   On: 01/08/2022 21:57   DG Chest Portable 1 View  Result Date: 01/08/2022 CLINICAL DATA:  Chest pain. EXAM: PORTABLE CHEST 1 VIEW COMPARISON:  Chest radiograph dated 06/18/2020. FINDINGS: Endotracheal tube with tip just above the carina. Recommend retraction by 3 cm for optimal positioning. Left IJ central venous line with tip at the cavoatrial junction. There is shallow inspiration. There is cardiomegaly with vascular congestion and edema. Pneumonia is not excluded. Clinical correlation is recommended. Probable trace left pleural effusion. No pneumothorax. No acute osseous pathology. IMPRESSION: 1. Endotracheal tube with tip just above the carina. Recommend retraction by 3 cm for optimal positioning. 2. Cardiomegaly with vascular congestion and edema. Pneumonia is not excluded. Electronically Signed   By: Anner Crete M.D.   On: 01/08/2022 20:16    CT ANGIO HEAD NECK W WO CM (CODE STROKE)  Result Date: 01/08/2022 CLINICAL DATA:  Neuro deficit, acute, stroke suspected; 193790 EXAM: CT ANGIOGRAPHY HEAD AND NECK CT PERFUSION BRAIN TECHNIQUE: Multidetector CT imaging of the head and neck was performed using the standard protocol during bolus administration of intravenous contrast. Multiplanar CT image reconstructions and MIPs were obtained to evaluate the vascular anatomy. Carotid stenosis measurements (when applicable) are obtained utilizing NASCET criteria, using the distal internal carotid diameter as the denominator. Multiphase CT imaging of the brain was performed following IV bolus contrast injection. Subsequent parametric perfusion maps were calculated using RAPID software. RADIATION DOSE REDUCTION: This exam was performed according to the departmental dose-optimization program which includes automated exposure control, adjustment of the mA and/or kV according to patient size and/or use of iterative reconstruction technique. CONTRAST:  47mL OMNIPAQUE IOHEXOL 350 MG/ML SOLN COMPARISON:  CT head from the same day. FINDINGS: CTA NECK FINDINGS Aortic arch: Great vessel origins are patent. Right carotid system: Common carotid artery is patent without significant stenosis. The ICA is occluded proximally with non opacification of the remainder of the neck. Left carotid system: Mild atherosclerosis at the carotid bifurcation without significant (greater than 50%) stenosis. Vertebral arteries: Small/non dominant right vertebral artery, which is irregular throughout its V2 segment and occluded at the V3 segment with non opacification of the proximal intradural vertebral artery and reconstitution of the distal intradural vertebral artery. Dominant left intradural vertebral artery is patent throughout the neck without significant) greater than 50%) stenosis. Skeleton: No acute findings. Other neck: No acute findings Upper chest: Moderate layering bilateral pleural  effusions. Peribronchial thickening. Review of the MIP images confirms the above findings CTA HEAD FINDINGS Anterior circulation: Non-opacified right intracranial ICA. Small but opacified right M1 MCA and proximal right M2 MCA branches. Severe left paraclinoid ICA stenosis. Left MCA and ACAs are patent. Posterior circulation: Proximal right intradural vertebral artery is not opacified. Distal intradural vertebral artery is opacified, likely from collaterals and retrograde flow. Left intradural vertebral artery, basilar artery and bilateral posterior cerebral arteries are patent without proximal hemodynamically significant stenosis. Venous sinuses: As permitted by contrast timing, patent. Review of the MIP images confirms the above findings CT Brain Perfusion Findings: ASPECTS: 10. CBF (<30%) Volume: 41mL Perfusion (Tmax>6.0s) volume: 17mL Perfusion (Tmax>4.0s) volume: 38 mL Mismatch Volume: 40mL Infarction Location:None identified. IMPRESSION: 1. 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 Right MCA and A1 ACA are patent, but diminutive. 2. Small/non dominant right vertebral artery, which is irregular  throughout its V2 segment with age indeterminate occlusion at the V3 segment with non opacification of the proximal intradural vertebral artery and reconstitution of the distal intradural vertebral artery. 3. Severe left paraclinoid ICA stenosis. 4. One CT perfusion, no evidence of core infarct or penumbra; however, there is approximately 38 mL of T-max greater than 4 seconds in the right MCA territory which suggests possible oligemia. An MRI could provide more sensitive evaluation for acute infarct. 5. Moderate layering bilateral pleural effusions and peribronchial wall thickening. Recommend dedicated chest imaging. Code stroke imaging results were communicated on 01/08/2022 at 2:16 pm to provider Bhagat via telephone, who verbally  acknowledged these results. Electronically Signed   By: Margaretha Sheffield M.D.   On: 01/08/2022 14:19   CT CEREBRAL PERFUSION W CONTRAST  Result Date: 01/08/2022 CLINICAL DATA:  Neuro deficit, acute, stroke suspected; [id number] EXAM: CT ANGIOGRAPHY HEAD AND NECK CT PERFUSION BRAIN TECHNIQUE: Multidetector CT imaging of the head and neck was performed using the standard protocol during bolus administration of intravenous contrast. Multiplanar CT image reconstructions and MIPs were obtained to evaluate the vascular anatomy. Carotid stenosis measurements (when applicable) are obtained utilizing NASCET criteria, using the distal internal carotid diameter as the denominator. Multiphase CT imaging of the brain was performed following IV bolus contrast injection. Subsequent parametric perfusion maps were calculated using RAPID software. RADIATION DOSE REDUCTION: This exam was performed according to the departmental dose-optimization program which includes automated exposure control, adjustment of the mA and/or kV according to patient size and/or use of iterative reconstruction technique. CONTRAST:  58mL OMNIPAQUE IOHEXOL 350 MG/ML SOLN COMPARISON:  CT head from the same day. FINDINGS: CTA NECK FINDINGS Aortic arch: Great vessel origins are patent. Right carotid system: Common carotid artery is patent without significant stenosis. The ICA is occluded proximally with non opacification of the remainder of the neck. Left carotid system: Mild atherosclerosis at the carotid bifurcation without significant (greater than 50%) stenosis. Vertebral arteries: Small/non dominant right vertebral artery, which is irregular throughout its V2 segment and occluded at the V3 segment with non opacification of the proximal intradural vertebral artery and reconstitution of the distal intradural vertebral artery. Dominant left intradural vertebral artery is patent throughout the neck without significant) greater than 50%) stenosis. Skeleton: No  acute findings. Other neck: No acute findings Upper chest: Moderate layering bilateral pleural effusions. Peribronchial thickening. Review of the MIP images confirms the above findings CTA HEAD FINDINGS Anterior circulation: Non-opacified right intracranial ICA. Small but opacified right M1 MCA and proximal right M2 MCA branches. Severe left paraclinoid ICA stenosis. Left MCA and ACAs are patent. Posterior circulation: Proximal right intradural vertebral artery is not opacified. Distal intradural vertebral artery is opacified, likely from collaterals and retrograde flow. Left intradural vertebral artery, basilar artery and bilateral posterior cerebral arteries are patent without proximal hemodynamically significant stenosis. Venous sinuses: As permitted by contrast timing, patent. Review of the MIP images confirms the above findings CT Brain Perfusion Findings: ASPECTS: 10. CBF (<30%) Volume: 31mL Perfusion (Tmax>6.0s) volume: 35mL Perfusion (Tmax>4.0s) volume: 38 mL Mismatch Volume: 43mL Infarction Location:None identified. IMPRESSION: 1. 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 Right MCA and A1 ACA are patent, but diminutive. 2. Small/non dominant right vertebral artery, which is irregular throughout its V2 segment with age indeterminate occlusion at the V3 segment with non opacification of the proximal intradural vertebral artery and reconstitution of the distal intradural vertebral artery. 3. Severe left  paraclinoid ICA stenosis. 4. One CT perfusion, no evidence of core infarct or penumbra; however, there is approximately 38 mL of T-max greater than 4 seconds in the right MCA territory which suggests possible oligemia. An MRI could provide more sensitive evaluation for acute infarct. 5. Moderate layering bilateral pleural effusions and peribronchial wall thickening. Recommend dedicated chest imaging. Code stroke imaging  results were communicated on 01/08/2022 at 2:16 pm to provider Bhagat via telephone, who verbally acknowledged these results. Electronically Signed   By: Margaretha Sheffield M.D.   On: 01/08/2022 14:19   CT HEAD CODE STROKE WO CONTRAST  Result Date: 01/08/2022 CLINICAL DATA:  Code stroke.  Left arm weakness.  Left facial droop. EXAM: CT HEAD WITHOUT CONTRAST TECHNIQUE: Contiguous axial images were obtained from the base of the skull through the vertex without intravenous contrast. RADIATION DOSE REDUCTION: This exam was performed according to the departmental dose-optimization program which includes automated exposure control, adjustment of the mA and/or kV according to patient size and/or use of iterative reconstruction technique. COMPARISON:  CT head without contrast 12/12/2021 FINDINGS: Brain: Periventricular white matter hypodensities adjacent to the frontal horn of the right lateral ventricles and in the right corona radiata are new since prior exam. No acute or focal cortical abnormality is present. Basal ganglia are intact. Insular ribbon is normal. The ventricles are of normal size. No significant extraaxial fluid collection is present. The brainstem and cerebellum are within normal limits. Vascular: Atherosclerotic calcifications are again seen within the cavernous internal carotid arteries. No hyperdense vessel is present. No significant interval change is present. Skull: Calvarium is intact. No focal lytic or blastic lesions are present. No significant extracranial soft tissue lesion is present. Sinuses/Orbits: Mild mucosal thickening is present maxillary sinuses bilaterally. The paranasal sinuses and mastoid air cells are otherwise clear. The globes and orbits are within normal limits. ASPECTS [place] [facility] Stroke Program Early CT Score) - Ganglionic level infarction (caudate, lentiform nuclei, internal capsule, insula, M1-M3 cortex): 7/7 - Supraganglionic infarction (M4-M6 cortex): 3/3 Total score (0-10 with  10 being normal): 10/10 IMPRESSION: 1. Periventricular white matter hypodensities adjacent to the frontal horn of the right lateral ventricles and in the right corona radiata are new since prior exam. This may represent acute ischemia. 2. No acute or focal cortical abnormality. 3. ASPECTS is 10/10. The above was relayed via text pager to Dr. Curly Shores on 01/08/2022 at 13:39 . Electronically Signed   By: San Morelle M.D.   On: 01/08/2022 13:41   MR FOOT LEFT WO CONTRAST  Result Date: 01/02/2022 CLINICAL DATA:  Foot swelling, diabetic, osteomyelitis suspected, xray done EXAM: MRI OF THE LEFT FOOT WITHOUT CONTRAST TECHNIQUE: Multiplanar, multisequence MR imaging of the left forefoot was performed. No intravenous contrast was administered. COMPARISON:  Foot radiograph 12/14/2021 FINDINGS: Bones/Joint/Cartilage Postsurgical changes of partial fifth ray amputation with residual fifth metatarsal and minimal marrow edema at the surgical margin. There is marrow edema and low T1 signal in the fourth metatarsal head and marrow edema in the fourth toe proximal phalanx. Mild effusion/joint distension of the fourth MTP joint. There is artifactual increased marrow signal in the great toe distal phalanx. Ligaments Intact Lisfranc ligament. Muscles and Tendons Intramuscular edema and atrophy in the foot as is commonly seen in diabetics. Soft tissues There is soft tissue swelling of the foot most prominent in the lateral forefoot. There is no well-defined fluid collection on noncontrast MRI. IMPRESSION: Osteomyelitis of the distal fourth metatarsal head and likely early osteomyelitis of the adjacent proximal phalanx.  Trace fourth MTP joint effusion suggesting developing septic arthritis. Adjacent soft tissue swelling without evidence of soft tissue abscess on noncontrast MRI. 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 These results will be called to the ordering clinician or representative by the Radiologist Assistant, and communication documented in the PACS or [employer]. Electronically Signed   By: Maurine Simmering M.D.   On: 01/02/2022 18:58   VAS [name] UPPER EXTREMITY ARTERIAL DUPLEX  Result Date: 12/21/2021  UPPER EXTREMITY DUPLEX STUDY Patient Name:  SHARRA CAYABYAB  Date of Exam:   12/21/2021 Medical Rec #: [id number]        Accession #:    [id number] Date of Birth: 1980-12-18        Patient Gender: F Patient Age:   77 years Exam Location:  [name] Procedure:      VAS [name] UPPER EXTREMITY ARTERIAL DUPLEX Referring Phys: Monica Martinez --------------------------------------------------------------------------------  Indications: Post operative evaluation of the right upper extremity.  Other Factors: Right Brachial, radial and ulnar artery thrombectomy. 11/18/2021 Comparison Study: No prior exam Performing Technologist: Alvia Grove RVT  Examination Guidelines: A complete evaluation includes B-mode imaging, spectral Doppler, color Doppler, and power Doppler as needed of all accessible portions of each vessel. Bilateral testing is considered an integral part of a complete examination. Limited examinations for reoccurring indications may be performed as noted.  Right Doppler Findings: +---------------+----------+---------+--------+--------+ Site           PSV (cm/s)Waveform StenosisComments +---------------+----------+---------+--------+--------+ Subclavian Dist58        triphasic                 +---------------+----------+---------+--------+--------+ Axillary       81        triphasic                 +---------------+----------+---------+--------+--------+ Brachial Prox  98        triphasic                 +---------------+----------+---------+--------+--------+ Brachial Mid   119       triphasic                  +---------------+----------+---------+--------+--------+ Brachial Dist  111       triphasic                 +---------------+----------+---------+--------+--------+ Radial Prox    63        triphasic                 +---------------+----------+---------+--------+--------+ Radial Mid     118       triphasic                 +---------------+----------+---------+--------+--------+ Radial Dist    58        triphasic                 +---------------+----------+---------+--------+--------+ Ulnar Prox     66        triphasic                 +---------------+----------+---------+--------+--------+ Ulnar Mid      73        triphasic                 +---------------+----------+---------+--------+--------+ Ulnar Dist     104       triphasic                 +---------------+----------+---------+--------+--------+  Left Doppler Findings: +------------+----------+---------+--------+--------+ Site        PSV (cm/s)Waveform StenosisComments +------------+----------+---------+--------+--------+ Brachial Mid85        triphasic                 +------------+----------+---------+--------+--------+   Summary:  Right: Patent right upper extremity with no visualized stenosis. . *See table(s) above for measurements and observations. Electronically signed by Harold Barban MD on 12/21/2021 at 3:55:43 PM.    Final      ECHOCARDIOGRAM COMPLETE  Result Date: 01/14/2022    ECHOCARDIOGRAM REPORT   Patient Name:   MARYKATHLEEN RUSSI Date of Exam: 01/14/2022 Medical Rec #:  [id number]       Height:       65.0 in Accession #:    [id number]      Weight:       197.1 lb Date of Birth:  1980-07-23       BSA:          1.966 m Patient Age:    63 years        BP:           139/76 mmHg Patient Gender: F               HR:           87 bpm. Exam Location:  Inpatient Procedure: 2D Echo, Color Doppler, Cardiac Doppler and Intracardiac            Opacification Agent Indications:    Dilated Cardiomyopathy   History:        Patient has prior history of Echocardiogram examinations, most                 recent 01/10/2022. CHF, CAD, Stroke; Risk Factors:Diabetes.  Sonographer:    Memory Argue Referring Phys: [id number] [place]  1. Left ventricular ejection fraction, by estimation, is 35 to 40%. Left ventricular ejection fraction by 2D MOD biplane is 37.1 %. The left ventricle has moderately decreased function. The left ventricle demonstrates regional wall motion abnormalities (see scoring diagram/findings for description). There is mild left ventricular hypertrophy. Left ventricular diastolic parameters are consistent with Grade II diastolic dysfunction (pseudonormalization). Elevated left ventricular end-diastolic pressure. The E/e' is 80. Wall motion abnormality suggestive of LAD territory infarct or less likely Takatsubo cardiomyopathy.  2. Right ventricular systolic function is normal. The right ventricular size is normal. Tricuspid regurgitation signal is inadequate for assessing PA pressure.  3. The mitral valve is abnormal. Trivial mitral valve regurgitation.  4. The inferior vena cava is normal in size with <50% respiratory variability, suggesting right atrial pressure of 8 mmHg.  5. The aortic valve is tricuspid. Aortic valve regurgitation is not visualized. Comparison(s): Changes from prior study are noted. 01/10/2022: LVEF 30-35%, mid to apical ventricular wall thinning and severe hypokinesis to akinesis. FINDINGS  Left Ventricle: No mural thrombus with Definity contrast. Left ventricular ejection fraction, by estimation, is 35 to 40%. Left ventricular ejection fraction by 2D MOD biplane is 37.1 %. The left ventricle has moderately decreased function. The left ventricle demonstrates regional wall motion abnormalities. Definity contrast agent was given IV to delineate the left ventricular endocardial borders. The left ventricular internal cavity size was normal in size. There is mild left ventricular  hypertrophy. Left ventricular diastolic parameters are consistent with Grade II diastolic dysfunction (pseudonormalization). Elevated left ventricular end-diastolic pressure. The E/e' is 15.  LV Wall Scoring: The mid and distal anterior wall, mid and distal lateral wall, mid and distal  anterior septum, entire apex, mid and distal inferior wall, mid anterolateral segment, and mid inferoseptal segment are hypokinetic. The basal anteroseptal segment, basal inferolateral segment, basal anterolateral segment, basal anterior segment, basal inferior segment, and basal inferoseptal segment are normal. Right Ventricle: The right ventricular size is normal. No increase in right ventricular wall thickness. Right ventricular systolic function is normal. Tricuspid regurgitation signal is inadequate for assessing PA pressure. Left Atrium: Left atrial size was normal in size. Right Atrium: Right atrial size was normal in size. Pericardium: Trivial pericardial effusion is present. The pericardial effusion is posterior to the left ventricle. Mitral Valve: The mitral valve is abnormal. There is mild thickening of the posterior and anterior mitral valve leaflet(s). Trivial mitral valve regurgitation. Tricuspid Valve: The tricuspid valve is grossly normal. Tricuspid valve regurgitation is trivial. Aortic Valve: The aortic valve is tricuspid. Aortic valve regurgitation is not visualized. Aortic valve mean gradient measures 3.0 mmHg. Aortic valve peak gradient measures 6.2 mmHg. Aortic valve area, by VTI measures 2.65 cm. Pulmonic Valve: The pulmonic valve was normal in structure. Pulmonic valve regurgitation is not visualized. Aorta: The aortic root and ascending aorta are structurally normal, with no evidence of dilitation. Venous: The inferior vena cava is normal in size with less than 50% respiratory variability, suggesting right atrial pressure of 8 mmHg. IAS/Shunts: No atrial level shunt detected by color flow Doppler.  LEFT  VENTRICLE PLAX 2D                        Biplane EF (MOD) LVIDd:         4.83 cm         LV Biplane EF:   Left LVIDs:         3.55 cm                          ventricular LV PW:         1.33 cm                          ejection LV IVS:        1.27 cm                          fraction by LVOT diam:     2.00 cm                          2D MOD LV SV:         72                               biplane is LV SV Index:   37                               37.1 %. LVOT Area:     3.14 cm                                Diastology                                LV e' medial:    5.11  cm/s LV Volumes (MOD)               LV E/e' medial:  21.5 LV vol d, MOD    193.0 ml      LV e' lateral:   6.20 cm/s A2C:                           LV E/e' lateral: 17.7 LV vol d, MOD    208.0 ml A4C: LV vol s, MOD    113.0 ml A2C: LV vol s, MOD    131.0 ml A4C: LV SV MOD A2C:   80.0 ml LV SV MOD A4C:   208.0 ml LV SV MOD BP:    77.1 ml RIGHT VENTRICLE RV S prime:     12.10 cm/s LEFT ATRIUM             Index LA diam:        3.95 cm 2.01 cm/m LA Vol (A2C):   66.5 ml 33.83 ml/m LA Vol (A4C):   42.7 ml 21.72 ml/m LA Biplane Vol: 54.7 ml 27.83 ml/m  AORTIC VALVE AV Area (Vmax):    2.51 cm AV Area (Vmean):   2.50 cm AV Area (VTI):     2.65 cm AV Vmax:           124.00 cm/s AV Vmean:          86.000 cm/s AV VTI:            0.271 m AV Peak Grad:      6.2 mmHg AV Mean Grad:      3.0 mmHg LVOT Vmax:         99.20 cm/s LVOT Vmean:        68.300 cm/s LVOT VTI:          0.229 m LVOT/AV VTI ratio: 0.85 MITRAL VALVE MV Area (PHT): 4.63 cm     SHUNTS MV Decel Time: 164 msec     Systemic VTI:  0.23 m MR Peak grad: 46.2 mmHg     Systemic Diam: 2.00 cm MR Vmax:      340.00 cm/s MV E velocity: 110.00 cm/s MV A velocity: 50.90 cm/s MV E/A ratio:  2.16 Lyman Bishop MD Electronically signed by Lyman Bishop MD Signature Date/Time: 01/14/2022/2:49:57 PM    Final    MR BRAIN WO CONTRAST  Result Date: 01/14/2022 CLINICAL DATA:  Neuro deficit, acute, stroke  suspected. 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 EXAM: MRI HEAD WITHOUT CONTRAST TECHNIQUE: Multiplanar, multiecho pulse sequences of the brain and surrounding structures were obtained without intravenous contrast. COMPARISON:  Head CT 01/11/2022 and MRI 01/09/2022 FINDINGS: The examination had to be discontinued prior to completion due to patient condition. Axial and coronal diffusion, axial T2* gradient echo, and axial T2 FLAIR sequences were obtained. The FLAIR sequence is severely motion degraded. Numerous patchy infarcts throughout the right MCA territory are unchanged in extent compared to the prior MRI, with the most confluent regions of infarct again noted to be near watershed areas. Small infarcts in the corpus callosum, right cerebral peduncle, and left frontal white matter are also unchanged. No definite new infarct or sizable intracranial hemorrhage is identified. There is no midline shift or hydrocephalus. Chronic white matter disease involving the cerebral hemispheres and brainstem are not well evaluated on this truncated, motion degraded study. IMPRESSION: 1. Motion degraded, incomplete examination. 2. Unchanged numerous infarcts as above, primarily involving the right  MCA territory. 3. No definite new intracranial abnormality. Electronically Signed   By: [name] M.D.   On: 01/14/2022 13:36   DG Abd Portable 1V  Result Date: 01/12/2022 CLINICAL DATA:  Feeding tube placement. EXAM: PORTABLE ABDOMEN - 1 VIEW COMPARISON:  01/10/2022 FINDINGS: Cortrak feeding tube placement with the tip in the region of the distal stomach or proximal duodenum. Visualized bowel gas pattern is nonobstructive. IMPRESSION: Feeding tube tip in the region of the distal stomach/proximal duodenum. Electronically Signed   By: Aletta Edouard M.D.   On: 01/12/2022 11:11   CT HEAD WO CONTRAST (5MM)  Result Date: 01/11/2022 CLINICAL DATA:  Follow-up stroke.  Revascularization of cervical ICA occlusion. EXAM: CT HEAD WITHOUT CONTRAST TECHNIQUE: Contiguous axial images were obtained from the base of the skull through the vertex without intravenous contrast. RADIATION DOSE REDUCTION: This exam was performed according to the departmental dose-optimization program which includes automated exposure control, adjustment of the mA and/or kV according to patient size and/or use of iterative reconstruction technique. COMPARISON:  CT and MRI studies most recently 01/09/2022 FINDINGS: Brain: No abnormality seen affecting the brainstem or cerebellum. Left cerebral hemisphere appears normal by CT. Multiple widely scattered foci of low density present within the right cerebral hemisphere primarily throughout the right middle cerebral artery territory consistent with innumerable small infarctions. No large regional confluent infarction. Mild generalized swelling. No hemorrhage. No midline shift. Vascular: Right ICA stent in place. Vascular calcification premature for age. Skull: Normal Sinuses/Orbits: Inflammatory changes of the paranasal sinuses. Orbits negative. Other: None IMPRESSION: Patchy low-density throughout the right middle cerebral artery territory consistent with the innumerable small infarction shown by MRI. No large confluent infarction. No evidence of hemorrhage or mass effect. Electronically Signed   By: Nelson Chimes M.D.   On: 01/11/2022 16:15   DG CHEST PORT 1 VIEW  Result Date: 01/11/2022 CLINICAL DATA:  Acute respiratory distress EXAM: PORTABLE CHEST 1 VIEW COMPARISON:  01/09/2022 FINDINGS: No significant change in AP portable chest radiograph. Cardiomegaly with mild, diffuse bilateral interstitial opacity and probable small, layering pleural effusions. No new or focal airspace opacity. Endotracheal tube tip position is generally obscured by overlying esophagogastric tube but appears appropriate over the midtrachea. Esophagogastric tube with tip and side port  below the diaphragm. IMPRESSION: 1. No significant change in AP portable chest radiograph. Cardiomegaly with mild, diffuse bilateral interstitial opacity and probable small, layering pleural effusions most likely edema. No new or focal airspace opacity. 2. Endotracheal tube tip position is generally obscured by overlying esophagogastric tube but appears appropriate over the midtrachea. Electronically Signed   By: Delanna Ahmadi M.D.   On: 01/11/2022 11:19   DG Abd Portable 1V  Result Date: 01/10/2022 CLINICAL DATA:  NG tube placement EXAM: PORTABLE ABDOMEN - 1 VIEW COMPARISON:  None Available. FINDINGS: Enteric tube terminates in the gastric antrum. Nonobstructive bowel gas pattern. Cholecystectomy clips. IMPRESSION: Enteric tube terminates in the gastric antrum. Electronically Signed   By: Julian Hy M.D.   On: 01/10/2022 19:06   US RENAL  Result Date: 01/10/2022 CLINICAL DATA:  Renal dysfunction EXAM: RENAL / URINARY TRACT ULTRASOUND COMPLETE COMPARISON:  01/01/2020 FINDINGS: Right Kidney: Renal measurements: 9.2 x 4.8 x 6.1 cm = volume: 141.7 mL. There is no hydronephrosis. There is increased cortical echogenicity. Left Kidney: Renal measurements: Lennie Odor by 6.2 x 4.8 cm = volume: 171.8 mL. There is no hydronephrosis. There is increased cortical echogenicity. Bladder: Foley catheter is seen in the bladder. Urinary bladder is not distended and  not adequately evaluated. Other: None. IMPRESSION: There is no hydronephrosis. Increased cortical echogenicity in the kidneys suggests medical renal disease. Electronically Signed   By: Elmer Picker M.D.   On: 01/10/2022 15:17   ECHOCARDIOGRAM COMPLETE BUBBLE STUDY  Result Date: 01/10/2022    ECHOCARDIOGRAM REPORT   Patient Name:   LEASHA GOLDBERGER Date of Exam: 01/10/2022 Medical Rec #:  [id number]       Height:       65.0 in Accession #:    [id number]      Weight:       209.0 lb Date of Birth:  02-16-1981       BSA:          2.015 m Patient Age:    70  years        BP:           121/65 mmHg Patient Gender: F               HR:           107 bpm. Exam Location:  Inpatient Procedure: 2D Echo, Color Doppler, Cardiac Doppler and Saline Contrast Bubble            Study Indications:     Stroke  History:         Patient has prior history of Echocardiogram examinations, most                  recent 11/19/2021. CHF, CAD, Stroke; Risk Factors:Diabetes.  Sonographer:     Memory Argue Sonographer#2:   Johny Chess RDCS Referring Phys:  [id number] Lorenza Chick Diagnosing Phys: Gwyndolyn Kaufman MD IMPRESSIONS  1. There is akinesis of the mid-to-apical septal LV segments, all apical segments, and apex. There is significant thinning of the mid-to-apical septal segments and apex likely represents scarring/infarct. The basal-to-mid inferior and basal septal segments are hypokinetic.  2. No LV thrombus visualized on definity imaging.  3. Left ventricular ejection fraction, by estimation, is 30 to 35%. The left ventricle has moderately decreased function. The left ventricle demonstrates regional wall motion abnormalities (see scoring diagram/findings for description). The left ventricular internal cavity size was mildly-to-moderately dilated. There is mild concentric left ventricular hypertrophy. Indeterminate diastolic filling due to E-A fusion.  4. Right ventricular systolic function is normal. The right ventricular size is normal.  5. The mitral valve is normal in structure. Trivial mitral valve regurgitation.  6. The aortic valve is tricuspid. Aortic valve regurgitation is not visualized. No aortic stenosis is present.  7. The inferior vena cava is dilated in size with >50% respiratory variability, suggesting right atrial pressure of 8 mmHg.  8. Agitated saline contrast bubble study was negative, with no evidence of any interatrial shunt. Comparison(s): Compared to prior TTE on 10/2021, the EF has dropped to 30-35% with similar wall motion. FINDINGS  Left Ventricle: There  is akinesis of the mid-to-apical septal LV segments, all apical segments, and apex. There is significant thinning of the mid-to-apical septal segments and apex which likely represents scarring/infarct. The basal-to-mid inferior and  basal septal segments are hypokinetic. No LV thrombus visualized. Left ventricular ejection fraction, by estimation, is 30 to 35%. The left ventricle has moderately decreased function. The left ventricle demonstrates regional wall motion abnormalities. Definity contrast agent was given IV to delineate the left ventricular endocardial borders. The left ventricular internal cavity size was mildly to moderately dilated. There is mild concentric left ventricular hypertrophy. Indeterminate diastolic filling  due to E-A fusion. Right Ventricle:  The right ventricular size is normal. No increase in right ventricular wall thickness. Right ventricular systolic function is normal. Left Atrium: Left atrial size was normal in size. Right Atrium: Right atrial size was normal in size. Pericardium: There is no evidence of pericardial effusion. Mitral Valve: The mitral valve is normal in structure. Trivial mitral valve regurgitation. Tricuspid Valve: The tricuspid valve is normal in structure. Tricuspid valve regurgitation is trivial. Aortic Valve: The aortic valve is tricuspid. Aortic valve regurgitation is not visualized. No aortic stenosis is present. Aortic valve mean gradient measures 4.0 mmHg. Aortic valve peak gradient measures 7.4 mmHg. Aortic valve area, by VTI measures 2.86 cm. Pulmonic Valve: The pulmonic valve was normal in structure. Pulmonic valve regurgitation is trivial. Aorta: The aortic root is normal in size and structure. Venous: The inferior vena cava is dilated in size with greater than 50% respiratory variability, suggesting right atrial pressure of 8 mmHg. IAS/Shunts: The atrial septum is grossly normal. Agitated saline contrast was given intravenously to evaluate for  intracardiac shunting. Agitated saline contrast bubble study was negative, with no evidence of any interatrial shunt.  LEFT VENTRICLE PLAX 2D LVIDd:         4.80 cm LVIDs:         3.40 cm LV PW:         1.00 cm LV IVS:        1.10 cm LVOT diam:     2.00 cm LV SV:         57 LV SV Index:   29 LVOT Area:     3.14 cm  LV Volumes (MOD) LV vol d, MOD A2C: 170.0 ml LV vol d, MOD A4C: 112.0 ml LV vol s, MOD A2C: 108.0 ml LV vol s, MOD A4C: 61.0 ml LV SV MOD A2C:     62.0 ml LV SV MOD A4C:     112.0 ml LV SV MOD BP:      56.6 ml RIGHT VENTRICLE RV S prime:     14.10 cm/s LEFT ATRIUM             Index        RIGHT ATRIUM           Index LA diam:        4.00 cm 1.98 cm/m   RA Area:     10.40 cm LA Vol (A2C):   60.2 ml 29.87 ml/m  RA Volume:   18.80 ml  9.33 ml/m LA Vol (A4C):   43.2 ml 21.43 ml/m LA Biplane Vol: 51.0 ml 25.30 ml/m  AORTIC VALVE AV Area (Vmax):    2.82 cm AV Area (Vmean):   2.71 cm AV Area (VTI):     2.86 cm AV Vmax:           136.00 cm/s AV Vmean:          94.500 cm/s AV VTI:            0.201 m AV Peak Grad:      7.4 mmHg AV Mean Grad:      4.0 mmHg LVOT Vmax:         122.00 cm/s LVOT Vmean:        81.600 cm/s LVOT VTI:          0.183 m LVOT/AV VTI ratio: 0.91  AORTA Ao Root diam: 2.90 cm MR Peak grad: 53.3 mmHg MR Vmax:      365.00 cm/s SHUNTS  Systemic VTI:  0.18 m                           Systemic Diam: 2.00 cm Gwyndolyn Kaufman MD Electronically signed by Gwyndolyn Kaufman MD Signature Date/Time: 01/10/2022/2:00:22 PM    Final (Updated)    MR ANGIO HEAD WO CONTRAST  Result Date: 01/09/2022 CLINICAL DATA:  Status post revascularization of occluded extracranial ICA. EXAM: MRA HEAD WITHOUT CONTRAST TECHNIQUE: Angiographic images of the Circle of Willis were acquired using MRA technique without intravenous contrast. COMPARISON:  Cerebral arteriogram 01/08/2022 FINDINGS: Anterior circulation: Segmental signal is present in the high cervical segment of the ICA. No signal is  visualized in the petrous are pre cavernous right ICA. Flow signal is present in the cavernous and supraclinoid right ICA as well as the posterior communicating artery. More normal flow is present in the high left cervical ICA. Signal loss in the supraclinoid left ICA may be artifactual. ICA terminus signal is within normal limits. The left A1 segment is dominant. Left M1 segment is normal. Flow is present in the right M1 segment. The MCA branch vessels are intact bilaterally vocal robust on the left. ACA branch vessels are within normal limits bilaterally. Posterior circulation: The left vertebral artery is the dominant vessel. No flow is present in the right vertebral artery below the PICA. The basilar artery is normal. The left posterior cerebral artery originates from the basilar tip. The right posterior cerebral artery is of fetal type. The right P1 segment is present. PCA branch vessels are within normal limits bilaterally. Anatomic variants: Fetal type right posterior cerebral artery. Other: None. IMPRESSION: 1. Abnormal flow signal within the petrous and pre cavernous right ICA may represent artifact from stent or slow flow 2. Signal loss in the supraclinoid left ICA may be artifactual. 3. Asymmetric diffused decreased size of right MCA branch vessels compared to the left likely reflects decreased perfusion. No significant proximal stenosis or occlusion. Electronically Signed   By: San Morelle M.D.   On: 01/09/2022 17:17   MR BRAIN WO CONTRAST  Result Date: 01/09/2022 CLINICAL DATA:  Status post revascularization of occluded cervical ICA. EXAM: MRI HEAD WITHOUT CONTRAST TECHNIQUE: Multiplanar, multiecho pulse sequences of the brain and surrounding structures were obtained without intravenous contrast. COMPARISON:  CT head without contrast 01/08/2022 FINDINGS: Brain: Diffusion-weighted images demonstrate extensive patchy restricted diffusion throughout the MCA territory. Most confluent areas are  along the watershed. There is significant sparing of cortex within the right MCA territory although scattered foci of acute infarct are noted. T2 and FLAIR hyperintensities are associated with the areas of acute infarction. Most consistent cortical infarct is in the anterior right frontal lobe along the watershed. Focal nonhemorrhagic infarct is also present within the genu of the corpus callosum and more posteriorly along the body of the corpus callosum on the right. No acute hemorrhage is present. White matter changes extend into the brainstem. The internal auditory canals are within normal limits. Cerebellum is within normal limits. Vascular: Intravascular contrast is present in the left internal carotid artery but not the right. This likely represents T1 shortening from height noted contrast. Branch vessels are seen in the [place] territory and left MCA territory but not on the right. Flow is present in the posterior circulation. Skull and upper cervical spine: The craniocervical junction is normal. Upper cervical spine is within normal limits. Marrow signal is unremarkable. Sinuses/Orbits: Fluid levels are present the maxillary sinuses and throughout the ethmoid  air cells. Mild mucosal thickening is present in the frontal sinuses bilaterally. Fluid levels are present in the sphenoid sinuses. Small mastoid effusions are present. The globes and orbits are within normal limits. IMPRESSION: 1. Extensive patchy areas of acute nonhemorrhagic infarction involving the right MCA territory, most confluent areas along the watershed. 2. Focal nonhemorrhagic infarct involving the genu of the corpus callosum and more posteriorly along the body of the corpus callosum on the right. 3. Diffuse sinus disease is likely secondary to intubation. Electronically Signed   By: San Morelle M.D.   On: 01/09/2022 17:09   DG Abd Portable 1V  Result Date: 01/09/2022 CLINICAL DATA:  Orogastric tube placement EXAM: PORTABLE ABDOMEN -  1 VIEW COMPARISON:  CT abdomen pelvis 06/23/2014 FINDINGS: Orogastric tube terminates in the region of the gastric antrum. Mild left basilar atelectasis. No dilated loops of bowel identified within the visualized upper abdomen. Central venous catheter tip located at the cavoatrial junction. IMPRESSION: Orogastric tube terminates in the region of the gastric antrum. Electronically Signed   By: Miachel Roux M.D.   On: 01/09/2022 10:07   DG Chest Port 1 View  Result Date: 01/09/2022 CLINICAL DATA:  Endotracheal tube placement. EXAM: PORTABLE CHEST 1 VIEW COMPARISON:  01/09/2022 earlier. FINDINGS: Endotracheal tube has tip 4.7 cm above the carina. Left IJ central venous catheter unchanged with tip at the cavoatrial junction. Lungs are hypoinflated with persistent hazy right perihilar/paramediastinal opacification and mild prominence of the central pulmonary vasculature. Stable mild opacification in the left base/retrocardiac region. No pneumothorax. Cardiomediastinal silhouette and remainder of the exam is unchanged. IMPRESSION: 1. Stable hazy opacification over the right perihilar/paramediastinal region and left base/retrocardiac region. Findings may be due to asymmetric edema versus infection. Suggestion of mild vascular congestion. 2. Tubes and lines as described. Electronically Signed   By: Marin Olp M.D.   On: 01/09/2022 09:21   DG CHEST PORT 1 VIEW  Result Date: 01/09/2022 CLINICAL DATA:  Status post intubation. EXAM: PORTABLE CHEST 1 VIEW COMPARISON:  Chest radiograph dated 01/08/2021. FINDINGS: Interval retraction of the endotracheal tube with tip now approximately 4 cm above the carina. Left IJ central venous line in similar position. Similar or slightly worsened scratch pulmonary hazy and streaky densities, likely combination of atelectasis and vascular congestion and edema. Pneumonia is not excluded. Trace left pleural effusion suspected. No pneumothorax. No acute osseous pathology. IMPRESSION:  Interval retraction of the endotracheal tube with tip now approximately 4 cm above the carina. Similar or slightly worsened pulmonary opacities in the right upper lobe compared to the earlier radiograph. Electronically Signed   By: Anner Crete M.D.   On: 01/09/2022 01:04   CT HEAD WO CONTRAST (5MM)  Result Date: 01/08/2022 CLINICAL DATA:  Stroke follow-up EXAM: CT HEAD WITHOUT CONTRAST TECHNIQUE: Contiguous axial images were obtained from the base of the skull through the vertex without intravenous contrast. RADIATION DOSE REDUCTION: This exam was performed according to the departmental dose-optimization program which includes automated exposure control, adjustment of the mA and/or kV according to patient size and/or use of iterative reconstruction technique. COMPARISON:  01/08/2022 CT head FINDINGS: Brain: Redemonstrated hypodensity in the right frontal periventricular white matter. No additional hypodensity. No evidence of acute hemorrhage, mass, mass effect, or midline shift. No hydrocephalus or extra-axial fluid collection. Vascular: No hyperdense vessel. Skull: Normal. Negative for fracture or focal lesion. Sinuses/Orbits: Mucosal thickening throughout the paranasal sinuses, with air-fluid levels with bubbly fluid in the maxillary sinuses and right sphenoid sinus. Fluid in the nasopharynx  is likely related to intubation. The orbits are unremarkable. Other: The mastoid air cells are well aerated. IMPRESSION: 1. Redemonstrated hypodensity in the right frontal periventricular white matter. No additional acute intracranial process. No additional acute infarct or hemorrhage. 2. Air-fluid levels with bubbly fluid in the maxillary sinuses and right sphenoid sinus are likely related to intubation but can be seen in the setting of sinusitis. Electronically Signed   By: Merilyn Baba M.D.   On: 01/08/2022 21:57   DG Chest Portable 1 View  Result Date: 01/08/2022 CLINICAL DATA:  Chest pain. EXAM: PORTABLE CHEST  1 VIEW COMPARISON:  Chest radiograph dated 06/18/2020. FINDINGS: Endotracheal tube with tip just above the carina. Recommend retraction by 3 cm for optimal positioning. Left IJ central venous line with tip at the cavoatrial junction. There is shallow inspiration. There is cardiomegaly with vascular congestion and edema. Pneumonia is not excluded. Clinical correlation is recommended. Probable trace left pleural effusion. No pneumothorax. No acute osseous pathology. IMPRESSION: 1. Endotracheal tube with tip just above the carina. Recommend retraction by 3 cm for optimal positioning. 2. Cardiomegaly with vascular congestion and edema. Pneumonia is not excluded. Electronically Signed   By: Anner Crete M.D.   On: 01/08/2022 20:16   CT ANGIO HEAD NECK W WO CM (CODE STROKE)  Result Date: 01/08/2022 CLINICAL DATA:  Neuro deficit, acute, stroke suspected; 384665 EXAM: CT ANGIOGRAPHY HEAD AND NECK CT PERFUSION BRAIN TECHNIQUE: Multidetector CT imaging of the head and neck was performed using the standard protocol during bolus administration of intravenous contrast. Multiplanar CT image reconstructions and MIPs were obtained to evaluate the vascular anatomy. Carotid stenosis measurements (when applicable) are obtained utilizing NASCET criteria, using the distal internal carotid diameter as the denominator. Multiphase CT imaging of the brain was performed following IV bolus contrast injection. Subsequent parametric perfusion maps were calculated using RAPID software. RADIATION DOSE REDUCTION: This exam was performed according to the departmental dose-optimization program which includes automated exposure control, adjustment of the mA and/or kV according to patient size and/or use of iterative reconstruction technique. CONTRAST:  45mL OMNIPAQUE IOHEXOL 350 MG/ML SOLN COMPARISON:  CT head from the same day. FINDINGS: CTA NECK FINDINGS Aortic arch: Great vessel origins are patent. Right carotid system: Common carotid  artery is patent without significant stenosis. The ICA is occluded proximally with non opacification of the remainder of the neck. Left carotid system: Mild atherosclerosis at the carotid bifurcation without significant (greater than 50%) stenosis. Vertebral arteries: Small/non dominant right vertebral artery, which is irregular throughout its V2 segment and occluded at the V3 segment with non opacification of the proximal intradural vertebral artery and reconstitution of the distal intradural vertebral artery. Dominant left intradural vertebral artery is patent throughout the neck without significant) greater than 50%) stenosis. Skeleton: No acute findings. Other neck: No acute findings Upper chest: Moderate layering bilateral pleural effusions. Peribronchial thickening. Review of the MIP images confirms the above findings CTA HEAD FINDINGS Anterior circulation: Non-opacified right intracranial ICA. Small but opacified right M1 MCA and proximal right M2 MCA branches. Severe left paraclinoid ICA stenosis. Left MCA and ACAs are patent. Posterior circulation: Proximal right intradural vertebral artery is not opacified. Distal intradural vertebral artery is opacified, likely from collaterals and retrograde flow. Left intradural vertebral artery, basilar artery and bilateral posterior cerebral arteries are patent without proximal hemodynamically significant stenosis. Venous sinuses: As permitted by contrast timing, patent. Review of the MIP images confirms the above findings CT Brain Perfusion Findings: ASPECTS: 10. CBF (<30%) Volume: 63mL  Perfusion (Tmax>6.0s) volume: 34mL Perfusion (Tmax>4.0s) volume: 38 mL Mismatch Volume: 68mL Infarction Location:None identified. IMPRESSION: 1. 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 Right MCA and A1 ACA are patent, but diminutive. 2. Small/non dominant right vertebral artery, which is  irregular throughout its V2 segment with age indeterminate occlusion at the V3 segment with non opacification of the proximal intradural vertebral artery and reconstitution of the distal intradural vertebral artery. 3. Severe left paraclinoid ICA stenosis. 4. One CT perfusion, no evidence of core infarct or penumbra; however, there is approximately 38 mL of T-max greater than 4 seconds in the right MCA territory which suggests possible oligemia. An MRI could provide more sensitive evaluation for acute infarct. 5. Moderate layering bilateral pleural effusions and peribronchial wall thickening. Recommend dedicated chest imaging. Code stroke imaging results were communicated on 01/08/2022 at 2:16 pm to provider Bhagat via telephone, who verbally acknowledged these results. Electronically Signed   By: Margaretha Sheffield M.D.   On: 01/08/2022 14:19   CT CEREBRAL PERFUSION W CONTRAST  Result Date: 01/08/2022 CLINICAL DATA:  Neuro deficit, acute, stroke suspected; [id number] EXAM: CT ANGIOGRAPHY HEAD AND NECK CT PERFUSION BRAIN TECHNIQUE: Multidetector CT imaging of the head and neck was performed using the standard protocol during bolus administration of intravenous contrast. Multiplanar CT image reconstructions and MIPs were obtained to evaluate the vascular anatomy. Carotid stenosis measurements (when applicable) are obtained utilizing NASCET criteria, using the distal internal carotid diameter as the denominator. Multiphase CT imaging of the brain was performed following IV bolus contrast injection. Subsequent parametric perfusion maps were calculated using RAPID software. RADIATION DOSE REDUCTION: This exam was performed according to the departmental dose-optimization program which includes automated exposure control, adjustment of the mA and/or kV according to patient size and/or use of iterative reconstruction technique. CONTRAST:  61mL OMNIPAQUE IOHEXOL 350 MG/ML SOLN COMPARISON:  CT head from the same day. FINDINGS:  CTA NECK FINDINGS Aortic arch: Great vessel origins are patent. Right carotid system: Common carotid artery is patent without significant stenosis. The ICA is occluded proximally with non opacification of the remainder of the neck. Left carotid system: Mild atherosclerosis at the carotid bifurcation without significant (greater than 50%) stenosis. Vertebral arteries: Small/non dominant right vertebral artery, which is irregular throughout its V2 segment and occluded at the V3 segment with non opacification of the proximal intradural vertebral artery and reconstitution of the distal intradural vertebral artery. Dominant left intradural vertebral artery is patent throughout the neck without significant) greater than 50%) stenosis. Skeleton: No acute findings. Other neck: No acute findings Upper chest: Moderate layering bilateral pleural effusions. Peribronchial thickening. Review of the MIP images confirms the above findings CTA HEAD FINDINGS Anterior circulation: Non-opacified right intracranial ICA. Small but opacified right M1 MCA and proximal right M2 MCA branches. Severe left paraclinoid ICA stenosis. Left MCA and ACAs are patent. Posterior circulation: Proximal right intradural vertebral artery is not opacified. Distal intradural vertebral artery is opacified, likely from collaterals and retrograde flow. Left intradural vertebral artery, basilar artery and bilateral posterior cerebral arteries are patent without proximal hemodynamically significant stenosis. Venous sinuses: As permitted by contrast timing, patent. Review of the MIP images confirms the above findings CT Brain Perfusion Findings: ASPECTS: 10. CBF (<30%) Volume: 24mL Perfusion (Tmax>6.0s) volume: 98mL Perfusion (Tmax>4.0s) volume: 38 mL Mismatch Volume: 20mL Infarction Location:None identified. IMPRESSION: 1. Age indeterminate occlusion of the proximal right ICA in the neck with non opacification distally  in the upper neck and intracranially. Right  MCA and A1 ACA are patent, but diminutive. 2. Small/non dominant right vertebral artery, which is irregular throughout its V2 segment with age indeterminate occlusion at the V3 segment with non opacification of the proximal intradural vertebral artery and reconstitution of the distal intradural vertebral artery. 3. Severe left paraclinoid ICA stenosis. 4. One CT perfusion, no evidence of core infarct or penumbra; however, there is approximately 38 mL of T-max greater than 4 seconds in the right MCA territory which suggests possible oligemia. An MRI could provide more sensitive evaluation for acute infarct. 5. Moderate layering bilateral pleural effusions and peribronchial wall thickening. Recommend dedicated chest imaging. Code stroke imaging results were communicated on 01/08/2022 at 2:16 pm to provider Bhagat via telephone, who verbally acknowledged these results. Electronically Signed   By: Margaretha Sheffield M.D.   On: 01/08/2022 14:19   CT HEAD CODE STROKE WO CONTRAST  Result Date: 01/08/2022 CLINICAL DATA:  Code stroke.  Left arm weakness.  Left facial droop. EXAM: CT HEAD WITHOUT CONTRAST TECHNIQUE: Contiguous axial images were obtained from the base of the skull through the vertex without intravenous contrast. RADIATION DOSE REDUCTION: This exam was performed according to the departmental dose-optimization program which includes automated exposure control, adjustment of the mA and/or kV according to patient size and/or use of iterative reconstruction technique. COMPARISON:  CT head without contrast 12/12/2021 FINDINGS: Brain: Periventricular white matter hypodensities adjacent to the frontal horn of the right lateral ventricles and in the right corona radiata are new since prior exam. No acute or focal cortical abnormality is present. Basal ganglia are intact. Insular ribbon is normal. The ventricles are of normal size. No significant extraaxial fluid collection is present. The brainstem and cerebellum are  within normal limits. Vascular: Atherosclerotic calcifications are again seen within the cavernous internal carotid arteries. No hyperdense vessel is present. No significant interval change is present. Skull: Calvarium is intact. No focal lytic or blastic lesions are present. No significant extracranial soft tissue lesion is present. Sinuses/Orbits: Mild mucosal thickening is present maxillary sinuses bilaterally. The paranasal sinuses and mastoid air cells are otherwise clear. The globes and orbits are within normal limits. ASPECTS [facility] Stroke Program Early CT Score) - Ganglionic level infarction (caudate, lentiform nuclei, internal capsule, insula, M1-M3 cortex): 7/7 - Supraganglionic infarction (M4-M6 cortex): 3/3 Total score (0-10 with 10 being normal): 10/10 IMPRESSION: 1. Periventricular white matter hypodensities adjacent to the frontal horn of the right lateral ventricles and in the right corona radiata are new since prior exam. This may represent acute ischemia. 2. No acute or focal cortical abnormality. 3. ASPECTS is 10/10. The above was relayed via text pager to Dr. Curly [place] on 01/08/2022 at 13:39 . Electronically Signed   By: San Morelle M.D.   On: 01/08/2022 13:41   MR FOOT LEFT WO CONTRAST  Result Date: 01/02/2022 CLINICAL DATA:  Foot swelling, diabetic, osteomyelitis suspected, xray done EXAM: MRI OF THE LEFT FOOT WITHOUT CONTRAST TECHNIQUE: Multiplanar, multisequence MR imaging of the left forefoot was performed. No intravenous contrast was administered. COMPARISON:  Foot radiograph 12/14/2021 FINDINGS: Bones/Joint/Cartilage Postsurgical changes of partial fifth ray amputation with residual fifth metatarsal and minimal marrow edema at the surgical margin. There is marrow edema and low T1 signal in the fourth metatarsal head and marrow edema in the fourth toe proximal phalanx. Mild effusion/joint distension of the fourth MTP joint. There is artifactual increased marrow signal in the great  toe distal phalanx. Ligaments Intact Lisfranc ligament. Muscles  and Tendons Intramuscular edema and atrophy in the foot as is commonly seen in diabetics. Soft tissues There is soft tissue swelling of the foot most prominent in the lateral forefoot. There is no well-defined fluid collection on noncontrast MRI. IMPRESSION: Osteomyelitis of the distal fourth metatarsal head and likely early osteomyelitis of the adjacent proximal phalanx. Trace fourth MTP joint effusion suggesting developing septic arthritis. Adjacent soft tissue swelling without evidence of soft tissue abscess on noncontrast MRI. 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 These results will be called to the ordering clinician or representative by the Radiologist Assistant, and communication documented in the PACS or [employer]. Electronically Signed   By: Maurine Simmering M.D.   On: 01/02/2022 18:58   VAS [name] UPPER EXTREMITY ARTERIAL DUPLEX  Result Date: 12/21/2021  UPPER EXTREMITY DUPLEX STUDY Patient Name:  JENNYLEE UEHARA  Date of Exam:   12/21/2021 Medical Rec #: [id number]        Accession #:    [id number] Date of Birth: 1980/10/28        Patient Gender: F Patient Age:   64 years Exam Location:  [name] Procedure:      VAS [name] UPPER EXTREMITY ARTERIAL DUPLEX Referring Phys: Monica Martinez --------------------------------------------------------------------------------  Indications: Post operative evaluation of the right upper extremity.  Other Factors: Right Brachial, radial and ulnar artery thrombectomy. 11/18/2021 Comparison Study: No prior exam Performing Technologist: Alvia Grove RVT  Examination Guidelines: A complete evaluation includes B-mode imaging, spectral Doppler, color Doppler, and power Doppler as needed of all accessible portions of each vessel. Bilateral testing is considered an  integral part of a complete examination. Limited examinations for reoccurring indications may be performed as noted.  Right Doppler Findings: +---------------+----------+---------+--------+--------+ Site           PSV (cm/s)Waveform StenosisComments +---------------+----------+---------+--------+--------+ Subclavian Dist58        triphasic                 +---------------+----------+---------+--------+--------+ Axillary       81        triphasic                 +---------------+----------+---------+--------+--------+ Brachial Prox  98        triphasic                 +---------------+----------+---------+--------+--------+ Brachial Mid   119       triphasic                 +---------------+----------+---------+--------+--------+ Brachial Dist  111       triphasic                 +---------------+----------+---------+--------+--------+ Radial Prox    63        triphasic                 +---------------+----------+---------+--------+--------+ Radial Mid     118       triphasic                 +---------------+----------+---------+--------+--------+ Radial Dist    58        triphasic                 +---------------+----------+---------+--------+--------+ Ulnar Prox     66        triphasic                 +---------------+----------+---------+--------+--------+  Ulnar Mid      73        triphasic                 +---------------+----------+---------+--------+--------+ Ulnar Dist     104       triphasic                 +---------------+----------+---------+--------+--------+   Left Doppler Findings: +------------+----------+---------+--------+--------+ Site        PSV (cm/s)Waveform StenosisComments +------------+----------+---------+--------+--------+ Brachial Mid85        triphasic                 +------------+----------+---------+--------+--------+   Summary:  Right: Patent right upper extremity with no visualized stenosis. . *See  table(s) above for measurements and observations. Electronically signed by Harold Barban MD on 12/21/2021 at 3:55:43 PM.    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  She also has residual osteomyelitis and is currently on antibiotics.  Upon review of her records, there is no evidence of prior history of pulmonary embolism or venous thromboembolism.  She appears to be very coagulopathic based on the history however most of the hypercoagulable disorders other than antiphospholipid antibody syndrome and JAK2 mediated bone marrow disorders tend to cause venous blood clots.   Potential differential diagnosis in this case would be APLS, vasculitis or other autoimmune diseases contributing to the hypercoagulable state. At this time she continues on heparin anticoagulation which is very appropriate.  Anticoagulant of choice at this time upon discharge is likely Lovenox at therapeutic dosing if there is no contraindication. I would discourage the use of DOACs especially since she has failed Eliquis.  If she were to have abnormal hypercoagulable work-up, some of it may have to be repeated to confirm the presence of antiphospholipid antibody syndrome.   I have discussed about following up with her outpatient upon discharge and discussing long-term anticoagulant choices. Please do not hesitate to contact us with any new questions or concerns.  The length of time of the face-to-face encounter was 65 minutes. More than 50% of time was spent counseling and coordination of care.  Significant amount of time was spent reviewing the records,  discussion with the care team for appropriate understanding of her history as well as discussion of anticoagulation recommendations and coordination of care.   Thank you for this referral.

## 2022-01-14 NOTE — Progress Notes (Signed)
MRI obtained and images reviewed: Multiple right MCA territory infarcts.  Numerous patchy infarcts throughout the right MCA territory are unchanged in extent compared to the prior MRI, with the most confluent regions of infarct again noted to be near watershed areas. Small infarcts in the corpus callosum, right cerebral peduncle, and left frontal white matter are also unchanged. No definite new infarct or sizable intracranial hemorrhage is identified. There is no midline shift or hydrocephalus. Chronic white matter disease involving the cerebral hemispheres and brainstem are not well evaluated on this truncated, motion degraded study.   IMPRESSION: 1. Motion degraded, incomplete examination. 2. Unchanged numerous infarcts as above, primarily involving the right MCA territory. 3. No definite new intracranial abnormality.  Laurey Morale, MSN, NP-C [facility] See AMION or use Epic Chat

## 2022-01-14 NOTE — Progress Notes (Addendum)
STROKE TEAM PROGRESS NOTE   INTERVAL HISTORY RN at bedside. Husband at bedside. Patient is awake and alert. Patient was able to write answers to questions today. Non-verbal. Exam unchanged. Still on heparin gtt. MRI brain ordered. No new neurological events overnight.   CBC:  Recent Labs  Lab 01/08/22 1326 01/08/22 1345 01/09/22 0503 01/10/22 0522 01/13/22 0429 01/14/22 0556  WBC 10.0   < > 26.5*   < > 22.0* 22.6*  NEUTROABS 7.3  --  26.0*  --   --   --   HGB 7.6*   < > 9.0*   < > 8.7* 7.7*  HCT 23.9*   < > 25.8*   < > 26.3* 23.7*  MCV 91.9   < > 89.0   < > 92.6 92.9  PLT 399   < > 327   < > 353 347   < > = values in this interval not displayed.   Basic Metabolic Panel:  Recent Labs  Lab 01/12/22 1615 01/13/22 0429 01/14/22 0556 01/14/22 0653  NA  --  148* 148*  --   K  --  3.5 3.6  --   CL  --  111 112*  --   CO2  --  28 25  --   GLUCOSE  --  81 137*  --   BUN  --  53* 48*  --   CREATININE  --  2.73* 2.47*  --   CALCIUM  --  8.5* 8.2*  --   MG 2.2 2.4  --  2.4  PHOS 4.2 3.7  --   --    Lipid Panel:  Recent Labs  Lab 01/09/22 0503  CHOL 85  TRIG 154*  156*  HDL 15*  CHOLHDL 5.7  VLDL 31  LDLCALC 39   HgbA1c:  Recent Labs  Lab 01/09/22 1425  HGBA1C 8.0*   Urine Drug Screen:  Recent Labs  Lab 01/09/22 0225  LABOPIA NONE DETECTED  COCAINSCRNUR NONE DETECTED  LABBENZ NONE DETECTED  AMPHETMU NONE DETECTED  THCU NONE DETECTED  LABBARB NONE DETECTED    Alcohol Level  Recent Labs  Lab 01/08/22 1339  ETH <10    IMAGING past 24 hours No results found.  PHYSICAL EXAM  Temp:  [98.7 F (37.1 C)-101.2 F (38.4 C)] 100.2 F (37.9 C) (09/14 0800) Pulse Rate:  [93-116] 100 (09/14 1031) Resp:  [23-47] 38 (09/14 0400) BP: (137-176)/(76-106) 139/76 (09/14 1031) SpO2:  [92 %-99 %] 99 % (09/14 0739) Weight:  [89.4 kg] 89.4 kg (09/14 0500)  General - Critically ill female. Well nourished, well developed, in no apparent distress. Cardiovascular -  Regular rhythm and rate.  Mental Status -  Not speaking, Follows commands on the right. She is able to write down questions. She told us she can speak but does want to. Right gaze preference, does not cross midline, left facial droop, tongue midline. PERRL and reactive. No blink to threat on left.   Motor Strength - Left arm is flaccid. Left leg withdraws to painful stimuli. Grimaces to noxious stimuli on left. Right side moves spontaneously and purposeful. Motor Tone - Muscle tone was assessed at the neck and appendages and was normal.   Sensory - appears to be intact to noxious stimuli  Coordination - Unable to assess  Gait and Station - deferred.   ASSESSMENT/PLAN [name] is a 41 y.o. female with history of coronary artery disease with myocardial infarction, heart failure with preserved EF, HTN, uncontrolled DM, hx  of strokes, hypothyroidism, right arm ischemia,  on Eliquis diabetes complicated by neuropathy, CKD stage III, left foot diabetic ulcer s/p amputation of the fifth toe and now with concern for osteomyelitis of the left fourth metatarsal, who presented to the ED for acute onset of chest pain with Left side weakness noted at triage. NIHSS of 8. Patient not a TPA candidate, underwent IR thrombectomy of R ICA  with R ICA dissection repair and stent placement with TICI 3  Stroke:  Acute Right MCA and vertebral stroke s/p thrombectomy with TICI 3 with right ICA dissection repair and stent placement  Etiology:  unclear, likely cardioembolic  Code Stroke CT head 1. Periventricular white matter hypodensities adjacent to the frontal horn of the right lateral ventricles and in the right corona radiata are new since prior exam. This may represent acute ischemia. 2. No acute or focal cortical abnormality. 3. ASPECTS is 10/10 CTA head & neck with CT perfusion  1. Age indeterminate occlusion of the proximal right ICA in the neck with non opacification distally in the upper neck  and intracranially. Right MCA and A1 ACA are patent, but diminutive. 2. Small/non dominant right vertebral artery, which is irregular throughout its V2 segment with age indeterminate occlusion at the V3 segment with non opacification of the proximal intradural vertebral artery and reconstitution of the distal intradural vertebral artery. 3. Severe left paraclinoid ICA stenosis. 4. One CT perfusion, no evidence of core infarct or penumbra; however, there is approximately 38 mL of T-max greater than 4 seconds in the right MCA territory which suggests possible oligemia. An MRI could provide more sensitive evaluation for acute infarct. 5. Moderate layering bilateral pleural effusions and peribronchial wall thickening. Recommend dedicated chest imaging  Cerebral angio  Occluded right internal carotid artery extracranially and intracranially to the supraclinoid region Status post endovascular complete revascularization of occluded right internal carotid artery proximally and distally intracranially with 1 pass with a 4 mm x 40 mm solitary extragenital device and proximal aspiration, 1 pass with a 5 mm x 37 mm amber trap retrieval device and aspiration proximally, and 1 pass with a 6.5 mm x 45 mm ambulatory with proximal aspiration achieving a complete revascularization of the internal carotid artery.   Status post reconstruction of symptomatic occlusive long segment dissection extending from the proximal one third of the right internal carotid artery to the petrous cavernous junction using 3 pipeline flow diverter's and a 4 mm x 24 mm Neuroform Atlas stent in a  telescope fashion.  Final arteriogram from the right common carotid artery demonstrates complete revascularization without any filling defects with opacification of the right middle cerebral artery distribution maintaining aTICI 3 revascularization.  Post IR CT  1. Redemonstrated hypodensity in the right frontal periventricular white matter. No  additional acute intracranial process. No additional acute infarct or hemorrhage. 2. Air-fluid levels with bubbly fluid in the maxillary sinuses and right sphenoid sinus are likely related to intubation but can be seen in the setting of sinusitis  MRI   1. Extensive patchy areas of acute nonhemorrhagic infarction involving the right MCA territory, most confluent areas along the watershed. 2. Focal nonhemorrhagic infarct involving the genu of the corpus callosum and more posteriorly along the body of the corpus callosum on the right. 3. Diffuse sinus disease is likely secondary to intubation MRA   1. Abnormal flow signal within the petrous and pre cavernous right ICA may represent artifact from stent or slow flow 2. Signal loss in the supraclinoid left  ICA may be artifactual. 3. Asymmetric diffused decreased size of right MCA branch vessels compared to the left likely reflects decreased perfusion. No significant proximal stenosis or occlusion  2D Echo EF 30-35%, akinesis of mid to apical septal LV segments, all apical segments and apex.  LDL 39 HgbA1c 9.6 VTE prophylaxis - SCD's    Diet   Diet NPO time specified   Eliquis (apixaban) daily prior to admission, now on aspirin 81 mg daily and Brilinta (ticagrelor) 90 mg bid.  Therapy recommendations:  CIR Disposition:  pending  Hypertension Home meds:  hydralazine, metoprolol, isosorbide Stable Long-term BP goal normotensive  Hyperlipidemia Home meds:  fenofibrate, not resumed in hospital LDL 39, goal < 70 Started on atorvastatin. Continue at discharge   Diabetes type II UnControlled Metabolic Acidosis  DKA Home meds:  insulin HgbA1c 9.6, goal < 7.0 CBGs Recent Labs    01/14/22 0021 01/14/22 0358 01/14/22 0823  GLUCAP 182* 123* 196*    insulin gtt currently off Correct electrolyte derangements SSI Needs close follow up with PCP to help manage DM   Other Stroke Risk Factors  Former Cigarette smoker advised to  stop smoking Obesity, Body mass index is 32.8 kg/m., BMI >/= 30 associated with increased stroke risk, recommend weight loss, diet and exercise as appropriate  Hx stroke/TIA Coronary artery disease Congestive heart failure  Other Active Problems Acute hypoxic respiratory Failure, resolved  managed by CCM Off prop Extubated 9/10 On bipap- CCM to trial off today  Chronic Combined HF EF 30-35%, decreased LV function, bubble study negative Meds on hold currently  Hypothyroidism  Home synthroid   AKI on CKD  IVF  Worsening Cr 2.50-3.07-3.63-3.80-4.17-4.54-4.16-2.73 Renal following  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 On  aspirin and Brilinta.   She is on heparin drip for elevated troponin possible coronary syndrome.  Being followed by cardiology.   her exam is unchanged and she is still unable to produce any speech but able to write.   Plan is to get MRI today. Discussed with ICU NP and ICU nurse. She can tolerate laying flat this am. Advised to give morphine prior to MRI.     Dr. Reeves Forth evaluated pt independently, reviewed imaging, chart, labs. Discussed and formulated plan with the APP. Please see APP note above for details.     This patient is critically ill due to respiratory distress, possible sepsis/osteo, and  stroke s/p thrombectomy and at significant risk of neurological worsening, death form heart failure, respiratory failure, recurrent stroke, bleeding from  [facility], seizure, sepsis. This patient's care requires constant monitoring of vital signs, hemodynamics, respiratory and cardiac monitoring, review of multiple databases, neurological assessment, discussion with family, other specialists and medical decision making of high complexity. I spent 35 minutes of neurocritical care time in the care of this patient.  Coreon Simkins,MD

## 2022-01-14 NOTE — Progress Notes (Signed)
Occupational Therapy Treatment Patient Details Name: Lauren [name] MRN: [id number] DOB: August 27, 1980 Today's Date: 01/14/2022   History of present illness 41 y/o female with known CAD s/p MI, HFrEF, DM type II c/b neuropathy, stage III CKD, peripheral vascular disease s/p L toe amputation and concern for continued osteomyelitis, previous CVA, and R UE recent thrombectomy due to DVT/ischemia admitted with R MCA stroke and vertebral artery stroke s/p IR for R ICA thrombectomy and repair of dissection with stent placement.   OT comments  Making steady progress. Able to progress OOB to recliner with Max A +2 stand pivot with physical assist to weight shift and advance RLE. Following commands with delay in processing. Assisting with simple grooming tasks and helping to feed herself while ST assessing her swallow. R gaze preference however able to sustain visual attention to midline for brief moments. Initiated use of partial occlusion glasses of R field to attempt to increase attention to L. Discussed with husband who plans to bring Lauren [name]'s glasses in tomorrow. Continue to recommend rehab at [facility] to maximize functional level of independence with ultimate goal of return ing home with family.Acute Ot to continue to follow.    Recommendations for follow up therapy are one component of a multi-disciplinary discharge planning process, led by the attending physician.  Recommendations may be updated based on patient status, additional functional criteria and insurance authorization.    Follow Up Recommendations  Acute inpatient rehab (3hours/day)    Assistance Recommended at Discharge Frequent or constant Supervision/Assistance  Patient can return home with the following  A lot of help with walking and/or transfers;A lot of help with bathing/dressing/bathroom;Two people to help with walking and/or transfers;Two people to help with bathing/dressing/bathroom;Assistance with cooking/housework;Assistance with  feeding;Direct supervision/assist for medications management;Direct supervision/assist for financial management;Assist for transportation;Help with stairs or ramp for entrance   Equipment Recommendations  Other (comment) (pending progression)    Recommendations for Other Services Rehab consult    Precautions / Restrictions Precautions Precautions: Fall Precaution Comments: L inattention, R gaze preference, L hemiplegia Restrictions Weight Bearing Restrictions: No       Mobility Bed Mobility Overal bed mobility: Needs Assistance Bed Mobility: Sidelying to Sit, Sit to Supine, Rolling   Sidelying to sit: Mod assist            Transfers Overall transfer level: Needs assistance Equipment used: 2 person hand held assist Transfers: Sit to/from Stand Sit to Stand: Max assist, +2 physical assistance           General transfer comment: gait belt and bed pad used; Able to come to standing with mod A +2 however Max A +2 for stand pivot; L knee buckling     Balance Overall balance assessment: Needs assistance Sitting-balance support: Feet supported, Single extremity supported Sitting balance-Leahy Scale: Poor Sitting balance - Comments: increased posterior lean Postural control: Left lateral lean Standing balance support: Bilateral upper extremity supported Standing balance-Leahy Scale: Zero Standing balance comment: max A +2 to stand                           ADL either performed or assessed with clinical judgement   ADL Overall ADL's : Needs assistance/impaired Eating/Feeding: NPO   Grooming: Moderate assistance;Sitting   Upper Body Bathing: Maximal assistance;Sitting   Lower Body Bathing: Total assistance   Upper Body Dressing : Maximal assistance;Sitting   Lower Body Dressing: Total assistance   Toilet Transfer: Total assistance   Toileting- Clothing Manipulation and  Hygiene: Total assistance       Functional mobility during ADLs: Maximal  assistance;+2 for physical assistance;+2 for safety/equipment      Extremity/Trunk Assessment Upper Extremity Assessment Upper Extremity Assessment: LUE deficits/detail RUE Deficits / Details: no active movement noted; presynergy; edematous L hand   Lower Extremity Assessment Lower Extremity Assessment: Defer to PT evaluation        Vision   Vision Assessment?: Vision impaired- to be further tested in functional context;Yes Eye Alignment: Within Functional Limits Ocular Range of Motion: Impaired-to be further tested in functional context Additional Comments: R gaze preference; able to sustian visual attention briefly to midline; 40M transpore tape used over R field to attempt to increase L gaze   Perception Perception Perception: Impaired (L neglect; will further assess;per nursing has been writing for communication)   Praxis Praxis Praxis:  (will assess)    Cognition Arousal/Alertness: Awake/alert Behavior During Therapy: Restless, Flat affect Overall Cognitive Status: Difficult to assess Area of Impairment: Attention, Safety/judgement, Following commands, Problem solving, Awareness                   Current Attention Level: Sustained   Following Commands: Follows one step commands with increased time Safety/Judgement: Decreased awareness of safety, Decreased awareness of deficits   Problem Solving: Slow processing, Decreased initiation, Difficulty sequencing, Requires verbal cues, Requires tactile cues General Comments: improved attention to task and ability to follow commands        Exercises Exercises: Other exercises Other Exercises Other Exercises: LUE PROM; encouraged husband to have pt use lotion to rub on arms to encourage increased attention to L and facilitate use of LUE Other Exercises: Educated husband on sitting at pt's midline then gradually working himeself toward her L side to increased attention to L Other Exercises: Educated on need to keep L  hand elevated to redcue dependent edema    Shoulder Instructions       General Comments SpO2 in the 90s on 10L; RR in the 30s    Pertinent Vitals/ Pain       Pain Assessment Pain Assessment: Faces Faces Pain Scale: Hurts little more Pain Location: seems bothered by the coretrack; L foot Pain Descriptors / Indicators: Discomfort, Grimacing Pain Intervention(s): Limited activity within patient's tolerance  Home Living                                          Prior Functioning/Environment              Frequency  Min 2X/week        Progress Toward Goals  OT Goals(current goals can now be found in the care plan section)  Progress towards OT goals: Progressing toward goals  Acute Rehab OT Goals Patient Stated Goal: per husband to get better OT Goal Formulation: With patient Time For Goal Achievement: 01/25/22 Potential to Achieve Goals: Good ADL Goals Pt Will Perform Grooming: with set-up;with min guard assist;sitting Pt Will Perform Upper Body Dressing: with min assist;sitting Pt Will Transfer to Toilet: with mod assist;with +2 assist;stand pivot transfer;bedside commode Additional ADL Goal #1: pt will follow 100% of simple 1 step commands with minimal cues to participate in functional tasks Additional ADL Goal #2: Pt will locate at least 3 grooming items in her L environment twith moderate cues to initiate grooming task  Plan      Co-evaluation    PT/OT/SLP  Co-Evaluation/Treatment: Yes Reason for Co-Treatment: Complexity of the patient's impairments (multi-system involvement);Necessary to address cognition/behavior during functional activity;For patient/therapist safety;To address functional/ADL transfers PT goals addressed during session: Mobility/safety with mobility;Balance;Strengthening/ROM OT goals addressed during session: ADL's and self-care      AM-PAC OT "6 Clicks" Daily Activity     Outcome Measure   Help from another person  eating meals?: Total Help from another person taking care of personal grooming?: A Lot Help from another person toileting, which includes using toliet, bedpan, or urinal?: Total Help from another person bathing (including washing, rinsing, drying)?: A Lot Help from another person to put on and taking off regular upper body clothing?: A Lot Help from another person to put on and taking off regular lower body clothing?: Total 6 Click Score: 9    End of Session Equipment Utilized During Treatment: Oxygen  OT Visit Diagnosis: Unsteadiness on feet (R26.81);Other abnormalities of gait and mobility (R26.89);Muscle weakness (generalized) (M62.81);Hemiplegia and hemiparesis Hemiplegia - Right/Left: Left Hemiplegia - dominant/non-dominant: Dominant Hemiplegia - caused by: Cerebral infarction   Activity Tolerance Patient tolerated treatment well   Patient Left with call bell/phone within reach;in chair;with chair alarm set;with family/visitor present   Nurse Communication Mobility status        Time: 3976-7341 OT Time Calculation (min): 35 min  Charges: OT General Charges $OT Visit: 1 Visit OT Treatments $Self Care/Home Management : 8-22 mins  Maurie Boettcher, OT/L   Acute OT Clinical Specialist [place] Pager [phone] Office [phone]   [facility] 01/14/2022, 12:08 PM

## 2022-01-14 NOTE — Progress Notes (Signed)
ABI w/ TBI attempted. Patient in MRI. Spoke with RN concerning plan to perform ABI later given patient current state.   Darlin Coco, RDMS, RVT

## 2022-01-14 NOTE — Progress Notes (Addendum)
[place] for heparin Indication: chest pain/ACS  Allergies  Allergen Reactions   Wellbutrin [Bupropion] Hives   Cefepime Rash    Tolerates penicilllin   Ciprofloxacin Hcl Hives and Rash    Hives/rash at injection site    Tape Rash    Patient Measurements: Height: 5\' 5"  (165.1 cm) Weight: 89.4 kg (197 lb 1.5 oz) IBW/kg (Calculated) : 57 Heparin Dosing Weight: 77kg  Vital Signs: Temp: 99.1 F (37.3 C) (09/14 2000) Temp Source: Axillary (09/14 2000) BP: 136/81 (09/14 2200) Pulse Rate: 93 (09/14 2200)  Labs: Recent Labs     0000 01/12/22 0027 01/12/22 0203 01/12/22 0541 01/12/22 1702 01/13/22 0429 01/13/22 0827 01/13/22 1059 01/13/22 2054 01/14/22 0556 01/14/22 1410 01/14/22 2028  HGB   < >  --  8.4*  --   --  8.7*  --   --   --  7.7*  --   --   HCT  --   --  24.5*  --   --  26.3*  --   --   --  23.7*  --   --   PLT  --   --  348  --   --  353  --   --   --  347  --   --   APTT  --   --   --  45*   < >  --  53* 48*   < > 63* 45* 96*  HEPARINUNFRC  --   --   --  0.88*  --   --  0.67  --   --  0.58  --   --   CREATININE   < >  --  3.58*  --   --  2.73*  --   --   --  2.47* 2.48*  --   TROPONINIHS  --  6,384* 3,512*  --   --   --   --  2,880*  --   --   --   --    < > = values in this interval not displayed.     Estimated Creatinine Clearance: 33.7 mL/min (A) (by C-G formula based on SCr of 2.48 mg/dL (H)).   Assessment: 44 yoF admitted with R MCA vertebral stroke s/p thrombectomy and stent placement. Pt is on apixaban prior to admit, no anticoagulation received this admission. Pt with elevated troponins and now chest pain, pharmacy asked to dose IV heparin for ACS management. Will defer bolus and utilize lower goal in setting of recent CVA. Will check aPTT along with initial heparin level as apixaban may still be affecting level  aPTT now above goal after rate increase earlier today. RN reports bruising that she thinks was  present PTA. No other overt s/sx of bleeding   Goal of Therapy:  Heparin level 0.3-0.5 units/ml aPTT 66-85 seconds Monitor platelets by anticoagulation protocol: Yes   Plan:  Reduce heparin to 1400 units/hr Recheck aPTT in 6h  Georga Bora, PharmD Clinical Pharmacist 01/14/2022 11:22 PM Please check AMION for all [place] numbers

## 2022-01-14 NOTE — Progress Notes (Addendum)
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   Patient presented to the ED 9/8 with left sided facial droop, left sided weakness, and slurred speech.  LKW: unknown.  Code stroke initiated. NIHSS: 8.  Imaging revealed: age indeterminate occlusion of proximal right ICA, V3 segment of right vertebral artery, left paraclinoid ICA stenosis. No core infarct or penumbra. Patient went immediately to IR for thrombectomy.  Of note, patient with recent arterial thrombectomy of the right upper extremity due to limb ischemia.  She has had URI x 2 days with recent sick contact.   COVID negative but Rhinovirus positive. Extubated in ICU post procedure.    Pertinent  Medical History  Anemia  CAD s/p MI CHF - LVEF 55-60%, down to 35-40% in 05/2020 CKD - Stage IV DM I Neuropathy  LLE Toe Amputation R ICA CVA   Significant Hospital Events: Including procedures, antibiotic start and stop dates in addition to other pertinent events   9/8 Admitted with left sided weakness, facial droop.  NIHSS 8, to IR  for thrombectomy, intubated.  MRSA PCR +, Rhinovirus + 9/10 PSV wean, moving RUE/RLE, extubated  9/11 ECHO>, CT head negative for acute changes, Trach asp negative,   Interim History / Subjective:  Tmax 101.2  Started on 10 L high flow nasal cannula overnight for tachypnea  1 urine, 1 stool, +639 mL past 24 hours, +2.3 L admission  Subjective:Denies chest pain, denies SOB  Objective   Blood pressure (!) 152/83, pulse (!) 116, temperature (!) 101.2 F (38.4 C), temperature source Axillary, resp. rate (!) 38, height 5\' 5"  (1.651 m), weight 89.4 kg, last menstrual period 11/01/2021, SpO2 99 %. CVP:   [1 mmHg-7 mmHg] 1 mmHg      Intake/Output Summary (Last 24 hours) at 01/14/2022 0750 Last data filed at 01/13/2022 1529 Gross per 24 hour  Intake 582.34 ml  Output --  Net 582.34 ml   Filed Weights   01/13/22 0500 01/13/22 0908 01/14/22 0500  Weight: 89.4 kg 89.4 kg 89.4 kg    Examination: General: In bed, NAD, appears comfortable HEENT: MM pink/moist, anicteric, atraumatic Neuro: RASS 0, PERRL 35mm, lifts RUL, RLE to command, no movement noted on L side, opens eyes spontaneously, remains non verbal CV: S1S2, ST, no m/r/g appreciated PULM:  wheezes in the upper lobes, wheezes in the lower lobes, trachea midline, chest expansion symmetric GI: soft, bsx4 active, non-tender   Extremities: warm/dry, trace pretibial edema, some edema in BLUE, capillary refill less than 3 seconds  Skin: L foot wound as below, no rashes or lesions noted       Labs/Imaging Blood cultures no growth day 5 Blood glucose 123-214 WBC 26.5 > 19.4 > 21.5 > 17.3 > 19.1> 22> 22.6 Hemoglobin 8.4 > 8.2 > 8.4 >8.7> 7.7 Creatinine 4.54 > 4.16 > 3.58 > 2.73>2.47 BUN 42 > 42 > 55> 53>48 K 3.5>3.6 NA 497>026>378   Resolved Hospital Problem list     Assessment & Plan:  Right MCA and right vertebral stroke s/p thrombectomy, right ICA dissection repair, stent placement Hx R Hand Ischemia s/p Thrombectomy 10/2021 Dysphagia  Not a candidate for thrombolytics, was on Eliquis PTA. Post procedure CT  Head negative for ICH. S/p cangrelor. Repeat CTH on 9/11 negative for acute changes -Management per neurology.  Appreciate neurology and IR assistance.  Unable to obtain MRI on 9/13 due to new onset chest pain and patient not tolerating laying flat.  Will attempt again to see if patient can tolerate laying flat today. -Continue ASA and Brilinta -Monitor neuro exam -Continue SLP, PT, OT  Acute Hypoxic Respiratory Failure  Rhinovirus Positive  Acute Exacerbation of Suspected COPD / Bronchospasm  Tobacco Abuse  No  documented hx of underlying lung disease but at minimum 1ppd smoker. Positive for rhinovirus. Tolerated 3-4L [place] for most of day on 9/12 and 9/13.  Started on 10 L high flow nasal cannula a.m. on 9/14 due to tachypnea. -Goal SPO2 90 to 98%.  Wean O2 to goal.  If tachypneic continue high flow nasal cannula -5 days total Solu-Medrol, (ends 9/16) -Continue Brovana, Pulmicort, Yupelri nebs -Aspiration precautions -Aggressive pulmonary hygiene -Diurese today with 40 lasix IV once  AKI on CKD IV Acute Metabolic Acidosis 2/2 DKA  Contrast Induced Nephropathy  Admit sr cr 2.0, DKA on admit resolved. UA unremarkable. AKI suspected to be related to CIN. Diuresed 9/10-12 with good response. Stopped on 9/13 due to hypernatremia. -Ensure renal perfusion. Goal MAP 65 or greater. -Avoid neprotoxic drugs as possible. -Strict I&O's -Follow up AM creatinine -Appreciate neph assistance.  Neph signed off.  Follow-up with nephrology in 4 weeks.  Normocytic Anemia Multifactorial in setting of blood loss, chronic disease. PRBC 9/10,  -Transfuse PRBC if HBG less than 7 -Obtain AM CBC to trend H&H -Monitor for signs of bleeding  Chronic Systolic & Diastolic CHF  CAD  Troponin elevation Chest Discomfort  HTN ECHO 10/2021 with LVEF 40-45%, G2DDEcho 9/10: LVEF 30 to 35%, left ventricle mildly decreased function, akinesis of the mid to apical septal LV segments, all apical segments, and apex.  Significant thinning of the mid to apical septal segments and apex reported as likely representing scarring/infarct. Patient with C/O chest pain overnight 9/11>  Started on heparin gtt and NTG GTT by cards. Troponin: 71 > 1.3 K >8 16 > 3.8 K >3.8 K >3.5 K> 2.8K.  Chest pain resumed again on 9/13.  Cardiology notified.  No significant changes on EKG. nitroglycerin drip resumed. -Cardiology following appreciate assistance.  Unable to cath patient due to CVA. -Repeat echo per cardiology.  Follow-up. -Continue heparin drip.  Will  discuss continuation with cardiology today. -Continue DAPT, statin, metoprolol 12.5 twice daily, Imdur 20 twice daily -Diurese today with 40 lasix IV once -As needed troponin and twelve-lead -Discussed hypercoagulable work-up with Dr.  Chryl Heck over phone on 9/12.  Will call Dr. Chryl Heck again today.  Addendum 9:22 AM: Discussed with Dr. Chryl Heck again on phone. To see patient on 9/14.  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  Podiatry recommends ID consult.  Consult discussed with Dr. Burna Cash on 9/13 -Wound care per podiatry orders> recommend Betadine with dry dressings as needed. -Continuing on Augmentin until ID makes recommendations.  Leukocytosis Suspect secondary to L foot osteo and steroids. Tmax 101.2. BC negative. -Monitor fever/WBC curve  Hypokalemia Hypernatremia K 3.6, NA 148>148 -Replete K -Increase free H20 to 358ml q4h. -Follow on AM BMP  DM I Blood glucose 123-214 -Appreciate diabetes assistance -Continue SSI resistant with tube feed coverage -Continue Semglee 30 units subcu  At Risk Malnutrition  -Continue tube feedings  Best Practice (right click and "Reselect all  SmartList Selections" daily)  Diet/type: tubefeeds DVT prophylaxis: systemic heparin GI prophylaxis: PPI Lines: No longer needed.  Order written to d/c  Foley:  N/A Code Status:  full code Last date of multidisciplinary goals of care discussion: 9/11. Husband updated at bedside 9/13. Will update again.    Critical Care Time: 34 minutes    The patient is critically ill with multiple organ systems failure and requires high complexity decision making for assessment and support, frequent evaluation and titration of therapies, application of advanced monitoring technologies and extensive interpretation of multiple databases.    Critical Care Time devoted to patient care services described in this note is 34 minutes. This  time reflects time of care of this [place] NP. This critical care time does not reflect procedure time but could involve care discussion time with the PCCM attending.  Redmond School., MSN, APRN, AGACNP-BC [facility]  01/14/2022 , 7:50 AM  Please see [email or web address] for pager details  If no response, please call [phone] After hours, please call Elink at [phone]

## 2022-01-14 NOTE — Progress Notes (Signed)
Rounding Note    Patient Name: Lauren [name] Date of Encounter: 01/14/2022  [place] Cardiologist: Carlyle Dolly, MD   Subjective   Patient seen and examined at her bedside.  ICU team is planning on downgrading the patient today.  Inpatient Medications    Scheduled Meds:  amoxicillin-clavulanate  1 tablet Per Tube Q12H   arformoterol  15 mcg Nebulization BID   aspirin  81 mg Oral Daily   Or   aspirin  81 mg Per Tube Daily   atorvastatin  40 mg Per Tube Daily   budesonide (PULMICORT) nebulizer solution  0.5 mg Nebulization BID   Chlorhexidine Gluconate Cloth  6 each Topical Daily   docusate  100 mg Per Tube BID   feeding supplement (PROSource TF20)  60 mL Per Tube Daily   free water  300 mL Per Tube Q4H   insulin aspart  0-20 Units Subcutaneous Q4H   insulin aspart  4 Units Subcutaneous Q4H   insulin glargine-yfgn  30 Units Subcutaneous Q24H   isosorbide mononitrate  20 mg Per Tube BID   levothyroxine  75 mcg Per Tube Q0600   methylPREDNISolone (SOLU-MEDROL) injection  40 mg Intravenous Daily   metoprolol tartrate  25 mg Per Tube BID    morphine injection  1 mg Intravenous Once   nicotine  21 mg Transdermal Daily   pantoprazole  40 mg Per Tube Daily   polyethylene glycol  17 g Per Tube Daily   revefenacin  175 mcg Nebulization Daily   ticagrelor  90 mg Oral BID   Or   ticagrelor  90 mg Per Tube BID   Continuous Infusions:  feeding supplement (GLUCERNA 1.5 CAL) 1,000 mL (01/13/22 1921)   heparin 1,500 Units/hr (01/14/22 0720)   nitroGLYCERIN 90 mcg/min (01/14/22 0602)   PRN Meds: acetaminophen **OR** acetaminophen (TYLENOL) oral liquid 160 mg/5 mL **OR** acetaminophen, diazepam, docusate sodium, ipratropium-albuterol, labetalol, mouth rinse, polyethylene glycol, senna-docusate   Vital Signs    Vitals:   01/14/22 0738 01/14/22 0739 01/14/22 0800 01/14/22 1031  BP:    139/76  Pulse:    100  Resp:      Temp:   100.2 F (37.9 C)   TempSrc:    Axillary   SpO2: 99% 99%    Weight:      Height:        Intake/Output Summary (Last 24 hours) at 01/14/2022 1202 Last data filed at 01/13/2022 1529 Gross per 24 hour  Intake 323.57 ml  Output --  Net 323.57 ml      01/14/2022    5:00 AM 01/13/2022    9:08 AM 01/13/2022    5:00 AM  Last 3 Weights  Weight (lbs) 197 lb 1.5 oz 197 lb 1.5 oz 197 lb 1.5 oz  Weight (kg) 89.4 kg 89.4 kg 89.4 kg      Telemetry    Sinus rhythm - Personally Reviewed  ECG    Sinus rhythm, with occasional PVC new but other findings are not new - Personally Reviewed  Physical Exam   GEN: No acute distress.   Neck: No JVD Cardiac: RRR, no murmurs, rubs, or gallops.  Respiratory: Clear to auscultation bilaterally. GI: Soft, nontender, non-distended  MS: No edema; No deformity. Neuro:  Nonfocal  Psych: Normal affect   Labs    High Sensitivity Troponin:   Recent Labs  Lab 01/08/22 1920 01/11/22 1001 01/12/22 0027 01/12/22 0203 01/13/22 1059  TROPONINIHS 860* 3,869* 3,807* 3,512* 2,880*  Chemistry Recent Labs  Lab 01/08/22 1326 01/08/22 1345 01/08/22 2014 01/08/22 2049 01/12/22 0203 01/12/22 1615 01/13/22 0429 01/14/22 0556 01/14/22 0653  NA 137   < > 135   < > 142  --  148* 148*  --   K 4.1   < > 5.8*   < > 3.7  --  3.5 3.6  --   CL 110   < > 113*   < > 109  --  111 112*  --   CO2 17*  --  14*   < > 22  --  28 25  --   GLUCOSE 258*   < > 483*   < > 272*  --  81 137*  --   BUN 26*   < > 28*   < > 55*  --  53* 48*  --   CREATININE 1.94*   < > 2.15*   < > 3.58*  --  2.73* 2.47*  --   CALCIUM 8.2*  --  7.0*   < > 8.2*  --  8.5* 8.2*  --   MG  --   --  1.9   < > 2.2 2.2 2.4  --  2.4  PROT 5.6*  --  4.4*  --   --   --   --   --   --   ALBUMIN 2.6*  --  2.1*  --   --   --   --   --   --   AST 18  --  34  --   --   --   --   --   --   ALT 14  --  19  --   --   --   --   --   --   ALKPHOS 73  --  67  --   --   --   --   --   --   BILITOT 0.4  --  0.5  --   --   --   --   --   --    GFRNONAA 33*  --  29*   < > 16*  --  22* 25*  --   ANIONGAP 10  --  8   < > 11  --  9 11  --    < > = values in this interval not displayed.    Lipids  Recent Labs  Lab 01/09/22 0503  CHOL 85  TRIG 154*  156*  HDL 15*  LDLCALC 39  CHOLHDL 5.7    Hematology Recent Labs  Lab 01/12/22 0203 01/13/22 0429 01/14/22 0556  WBC 19.1* 22.0* 22.6*  RBC 2.70* 2.84* 2.55*  HGB 8.4* 8.7* 7.7*  HCT 24.5* 26.3* 23.7*  MCV 90.7 92.6 92.9  MCH 31.1 30.6 30.2  MCHC 34.3 33.1 32.5  RDW 14.1 14.2 14.7  PLT 348 353 347   Thyroid No results for input(s): "TSH", "FREET4" in the last 168 hours.  BNPNo results for input(s): "BNP", "PROBNP" in the last 168 hours.  DDimer No results for input(s): "DDIMER" in the last 168 hours.   Radiology    No results found.  Cardiac Studies   TTE 01/10/2022 IMPRESSIONS   1. There is akinesis of the mid-to-apical septal LV segments, all apical segments, and apex. There is significant thinning of the mid-to-apical septal segments and apex likely represents scarring/infarct. The basal-to-mid inferior and basal septal  segments are hypokinetic.   2. No LV thrombus  visualized on definity imaging.   3. Left ventricular ejection fraction, by estimation, is 30 to 35%. The left ventricle has moderately decreased function. The left ventricle demonstrates regional wall motion abnormalities (see scoring diagram/findings for description). The left  ventricular internal cavity size was mildly-to-moderately dilated. There  is mild concentric left ventricular hypertrophy. Indeterminate diastolic filling due to E-A fusion.   4. Right ventricular systolic function is normal. The right ventricular size is normal.   5. The mitral valve is normal in structure. Trivial mitral valve regurgitation.   6. The aortic valve is tricuspid. Aortic valve regurgitation is not visualized. No aortic stenosis is present.   7. The inferior vena cava is dilated in size with >50% respiratory  variability, suggesting right atrial pressure of 8 mmHg.   8. Agitated saline contrast bubble study was negative, with no evidence of any interatrial shunt.   Comparison(s): Compared to prior TTE on 10/2021, the EF has dropped to 30-35% with similar wall motion.   FINDINGS   Left Ventricle: There is akinesis of the mid-to-apical septal LV  segments, all apical segments, and apex. There is significant thinning of  the mid-to-apical septal segments and apex which likely represents  scarring/infarct. The basal-to-mid inferior and   basal septal segments are hypokinetic. No LV thrombus visualized. Left  ventricular ejection fraction, by estimation, is 30 to 35%. The left  ventricle has moderately decreased function. The left ventricle  demonstrates regional wall motion abnormalities.  Definity contrast agent was given IV to delineate the left ventricular  endocardial borders. The left ventricular internal cavity size was mildly  to moderately dilated. There is mild concentric left ventricular  hypertrophy. Indeterminate diastolic filling   due to E-A fusion.   Right Ventricle: The right ventricular size is normal. No increase in  right ventricular wall thickness. Right ventricular systolic function is  normal.   Left Atrium: Left atrial size was normal in size.   Right Atrium: Right atrial size was normal in size.   Pericardium: There is no evidence of pericardial effusion.   Mitral Valve: The mitral valve is normal in structure. Trivial mitral  valve regurgitation.   Tricuspid Valve: The tricuspid valve is normal in structure. Tricuspid  valve regurgitation is trivial.   Aortic Valve: The aortic valve is tricuspid. Aortic valve regurgitation is  not visualized. No aortic stenosis is present. Aortic valve mean gradient  measures 4.0 mmHg. Aortic valve peak gradient measures 7.4 mmHg. Aortic  valve area, by VTI measures 2.86  cm.   Pulmonic Valve: The pulmonic valve was normal  in structure. Pulmonic valve  regurgitation is trivial.   Aorta: The aortic root is normal in size and structure.   Venous: The inferior vena cava is dilated in size with greater than 50%  respiratory variability, suggesting right atrial pressure of 8 mmHg.   IAS/Shunts: The atrial septum is grossly normal. Agitated saline contrast  was given intravenously to evaluate for intracardiac shunting. Agitated  saline contrast bubble study was negative, with no evidence of any  interatrial shunt.   Patient Profile     41 y.o. female with history of MI w/ cath 2015 med rx for mod-severe dz D1,OM2, RCA (u/a to do PCI), DM1, HTN, chronic systolic CHF 83/4196 LVEF 35-40%, grade III dd, apex akinetic, hypothyroidism, CKD stage IIIa, HLD, class II obesity, hx CVA 2020, tob use (quit at time of CVA).  Assessment & Plan   Coronary artery disease Ischemic cardiomyopathy Heart failure reduced ejection fraction Acute  CVA Anemia Acute exacerbation of COPD Diabetes mellitus  CAD -during this hospitalization troponin peaked at 3869 which is trending down.  Her echo did show some wall motion abnormalities which could be due to cardiac injury or Takotsubo cardiomyopathy given her events she is tolerating her occasions, she is back on her beta-blocker, aspirin and Brilinta, continue the patient Imdur as well as her statin medication.  The patient is a challenge for this patient as she has just recently had significant CVA and would like to hold off for cardiac catheterization at this time.    We can transition the patient off nitro drip and continue the isosorbide mononitrate.  In terms of her ischemic cardiomyopathy-cannot get her all of her guideline directed medical therapy due to worsening kidney function.  We will continue to monitor.  Echocardiogram is pending today.  Heart failure with reduced ejection fraction-does not appear to be volume overloaded we will continue to monitor closely.  Would  eventually benefit from a cautious dose of diuretic, agree with Lasix today.  She also did have kidney injury which creatinine now is improving.  She also is diffusely wheezing will defer to the primary team for nebulizer treatments.  Diabetes mellitus Per primary team.  CRITICAL CARE Performed by: Berniece Salines  Total critical care time: 30 minutes. Critical care time was exclusive of separately billable procedures and treating other patients. Critical care was necessary to treat or prevent imminent or life-threatening deterioration. Critical care was time spent personally by me on the following activities: development of treatment plan with patient and/or surrogate as well as nursing, discussions with consultants, evaluation of patient's response to treatment, examination of patient, obtaining history from patient or surrogate, ordering and performing treatments and interventions, ordering and review of laboratory studies, ordering and review of radiographic studies, pulse oximetry and re-evaluation of patient's condition.  Signed, Berniece Salines, DO [place]  01/14/2022 12:02 PM    For questions or updates, please contact [place] Please consult [email or web address] for contact info under    Signed, Berniece Salines, DO  01/14/2022, 12:02 PM

## 2022-01-14 NOTE — Progress Notes (Signed)
Cara RN aware of order to dc the CVC.

## 2022-01-14 NOTE — Patient Outreach (Signed)
  Care Coordination   Multidisciplinary Case Review Note    01/14/2022 Name: Lauren [name] MRN: [id number] DOB: 1980-09-30  Lauren [name] is a 41 y.o. year old female who sees Lemmie Evens, MD for primary care.  The  multidisciplinary care team met today to review patient care needs and barriers.     Goals Addressed               This Visit's Progress     Patient Stated     Manage diabetes and poor wound healing  (THN) (pt-stated)   Not on track     Care Coordination Interventions: Completed [facility] multidisciplinary case discussion 01/14/22 Referral to [facility] SW for financial, food insecurity, possible emergency medicaid- Noted outstanding hospital bill Admission for CVA 01/08/22  Podiatry recommends ID consult.  Consult discussed with Dr. Burna Cash on 9/13 -Wound care per podiatry orders> recommend Betadine with dry dressings as needed. -Continuing on Augmentin until ID makes recommendations.      seek help for coverage & financial resources [facility]) (pt-stated)   Not on track     11/24/21  Care Coordination Interventions: Evaluation of current treatment plan related to medical coverage, food insecurity and financial resources and patient's adherence to plan as established by provider Collaborated with [place] pod 1 case discussion team 01/14/22 regarding further treatment plans Care Guide referral for 12/17/21 care guide completed referral Sent program resource paper work to patient Pt hospitalized on 01/08/22 with CVA Remained hospitalized for 01/11/22 scheduled RN CM outreach. Collaboration with [facility] hospital liaison. Social Work referral for financial, food insecurity, emergency medicaid possibility Plus any other resources from [facility] or Inpatient TOC services Assessed social determinant of health barriers        SDOH assessments and interventions completed:  Yes  SDOH Interventions Today    Flowsheet Row Most Recent Value  SDOH Interventions   Food Insecurity Interventions  Ambulatory [id number] Order, Other (Comment)  [THN CMA outreached To be sent to [facility] SW for urgent referral 01/14/22]  Financial Strain Interventions Inpatient TOC, Other (Comment)  [THN CMA outreached To be sent to [facility] SW for urgent referral 01/14/22 to get IP TOC assistance]        Care Coordination Interventions Activated:  Yes   Care Coordination Interventions:  Yes, provided   Follow up plan: Follow up call scheduled for 01/18/22 3:30 pm    Multidisciplinary Team Attendees:   Nat Christen, [facility] SW, Barbaraann Faster, RN CM , Noreene Larsson, [place] Chong Sicilian, RN CM  Scribe for Multidisciplinary Case Review:  Jackelyn Poling, RN CM  Joelene Millin L. Lavina Hamman, RN, BSN, [place] Coordinator Office number [phone]

## 2022-01-14 NOTE — Progress Notes (Signed)
[place] for heparin Indication: chest pain/ACS  Allergies  Allergen Reactions   Wellbutrin [Bupropion] Hives   Cefepime Rash    Tolerates penicilllin   Ciprofloxacin Hcl Hives and Rash    Hives/rash at injection site    Tape Rash    Patient Measurements: Height: 5\' 5"  (165.1 cm) Weight: 89.4 kg (197 lb 1.5 oz) IBW/kg (Calculated) : 57 Heparin Dosing Weight: 77kg  Vital Signs: Temp: 98.6 F (37 C) (09/14 1200) Temp Source: Axillary (09/14 1200) BP: 139/76 (09/14 1031) Pulse Rate: 100 (09/14 1031)  Labs: Recent Labs     0000 01/12/22 0027 01/12/22 0203 01/12/22 0541 01/12/22 1702 01/13/22 0429 01/13/22 0827 01/13/22 1059 01/13/22 2054 01/14/22 0556 01/14/22 1410  HGB   < >  --  8.4*  --   --  8.7*  --   --   --  7.7*  --   HCT  --   --  24.5*  --   --  26.3*  --   --   --  23.7*  --   PLT  --   --  348  --   --  353  --   --   --  347  --   APTT  --   --   --  45*   < >  --  53* 48* 67* 63* 45*  HEPARINUNFRC  --   --   --  0.88*  --   --  0.67  --   --  0.58  --   CREATININE   < >  --  3.58*  --   --  2.73*  --   --   --  2.47* 2.48*  TROPONINIHS  --  4,403* 3,512*  --   --   --   --  2,880*  --   --   --    < > = values in this interval not displayed.     Estimated Creatinine Clearance: 33.7 mL/min (A) (by C-G formula based on SCr of 2.48 mg/dL (H)).   Assessment: 22 yoF admitted with R MCA vertebral stroke s/p thrombectomy and stent placement. Pt is on apixaban prior to admit, no anticoagulation received this admission. Pt with elevated troponins and now chest pain, pharmacy asked to dose IV heparin for ACS management. Will defer bolus and utilize lower goal in setting of recent CVA. Will check aPTT along with initial heparin level as apixaban may still be affecting level  aPTT is subtherapeutic at 45 seconds. Level drawn appropriately. No bleeding noted.   Goal of Therapy:  Heparin level 0.3-0.5 units/ml aPTT 66-85  seconds Monitor platelets by anticoagulation protocol: Yes   Plan:  Increase heparin to 1600 units/hr Recheck aPTT in 6h  Cristela Felt, PharmD, BCPS Clinical Pharmacist 01/14/2022 3:55 PM

## 2022-01-15 ENCOUNTER — Inpatient Hospital Stay (HOSPITAL_COMMUNITY): Payer: No Typology Code available for payment source

## 2022-01-15 ENCOUNTER — Encounter (HOSPITAL_COMMUNITY)
Admission: EM | Disposition: A | Discharge status: Rehabilitation Facility | Payer: Self-pay | Source: Home / Self Care | Attending: Family Medicine

## 2022-01-15 DIAGNOSIS — I255 Ischemic cardiomyopathy: Secondary | ICD-10-CM | POA: Diagnosis not present

## 2022-01-15 DIAGNOSIS — I6521 Occlusion and stenosis of right carotid artery: Secondary | ICD-10-CM | POA: Diagnosis not present

## 2022-01-15 DIAGNOSIS — I2510 Atherosclerotic heart disease of native coronary artery without angina pectoris: Secondary | ICD-10-CM | POA: Diagnosis not present

## 2022-01-15 LAB — BASIC METABOLIC PANEL
Anion gap: 10 (ref 5–15)
BUN: 53 mg/dL — ABNORMAL HIGH (ref 6–20)
CO2: 25 mmol/L (ref 22–32)
Calcium: 8.2 mg/dL — ABNORMAL LOW (ref 8.9–10.3)
Chloride: 109 mmol/L (ref 98–111)
Creatinine, Ser: 2.19 mg/dL — ABNORMAL HIGH (ref 0.44–1.00)
GFR, Estimated: 29 mL/min — ABNORMAL LOW (ref >60)
Glucose, Bld: 124 mg/dL — ABNORMAL HIGH (ref 70–99)
Potassium: 4.2 mmol/L (ref 3.5–5.1)
Sodium: 144 mmol/L (ref 135–145)

## 2022-01-15 LAB — GLUCOSE, CAPILLARY
Glucose-Capillary: 118 mg/dL — ABNORMAL HIGH (ref 70–99)
Glucose-Capillary: 121 mg/dL — ABNORMAL HIGH (ref 70–99)
Glucose-Capillary: 125 mg/dL — ABNORMAL HIGH (ref 70–99)
Glucose-Capillary: 126 mg/dL — ABNORMAL HIGH (ref 70–99)
Glucose-Capillary: 136 mg/dL — ABNORMAL HIGH (ref 70–99)
Glucose-Capillary: 145 mg/dL — ABNORMAL HIGH (ref 70–99)
Glucose-Capillary: 157 mg/dL — ABNORMAL HIGH (ref 70–99)
Glucose-Capillary: 158 mg/dL — ABNORMAL HIGH (ref 70–99)
Glucose-Capillary: 162 mg/dL — ABNORMAL HIGH (ref 70–99)
Glucose-Capillary: 195 mg/dL — ABNORMAL HIGH (ref 70–99)
Glucose-Capillary: 230 mg/dL — ABNORMAL HIGH (ref 70–99)
Glucose-Capillary: 232 mg/dL — ABNORMAL HIGH (ref 70–99)
Glucose-Capillary: 59 mg/dL — ABNORMAL LOW (ref 70–99)
Glucose-Capillary: 78 mg/dL (ref 70–99)

## 2022-01-15 LAB — CBC
HCT: 23.2 % — ABNORMAL LOW (ref 36.0–46.0)
Hemoglobin: 7.5 g/dL — ABNORMAL LOW (ref 12.0–15.0)
MCH: 30 pg (ref 26.0–34.0)
MCHC: 32.3 g/dL (ref 30.0–36.0)
MCV: 92.8 fL (ref 80.0–100.0)
Platelets: 344 10*3/uL (ref 150–400)
RBC: 2.5 MIL/uL — ABNORMAL LOW (ref 3.87–5.11)
RDW: 14.3 % (ref 11.5–15.5)
WBC: 27.2 10*3/uL — ABNORMAL HIGH (ref 4.0–10.5)
nRBC: 0.1 % (ref 0.0–0.2)

## 2022-01-15 LAB — POCT I-STAT 7, (LYTES, BLD GAS, ICA,H+H)
Acid-Base Excess: 6 mmol/L — ABNORMAL HIGH (ref 0.0–2.0)
Bicarbonate: 30.1 mmol/L — ABNORMAL HIGH (ref 20.0–28.0)
Calcium, Ion: 1.09 mmol/L — ABNORMAL LOW (ref 1.15–1.40)
HCT: 34 % — ABNORMAL LOW (ref 36.0–46.0)
Hemoglobin: 11.6 g/dL — ABNORMAL LOW (ref 12.0–15.0)
O2 Saturation: 100 %
Patient temperature: 99.8
Potassium: 4 mmol/L (ref 3.5–5.1)
Sodium: 141 mmol/L (ref 135–145)
TCO2: 31 mmol/L (ref 22–32)
pCO2 arterial: 42.1 mmHg (ref 32–48)
pH, Arterial: 7.465 — ABNORMAL HIGH (ref 7.35–7.45)
pO2, Arterial: 463 mmHg — ABNORMAL HIGH (ref 83–108)

## 2022-01-15 LAB — HEPARIN LEVEL (UNFRACTIONATED)
Heparin Unfractionated: 0.42 IU/mL (ref 0.30–0.70)
Heparin Unfractionated: 0.28 IU/mL — ABNORMAL LOW (ref 0.30–0.70)
Heparin Unfractionated: 0.42 IU/mL (ref 0.30–0.70)

## 2022-01-15 LAB — APTT
aPTT: 64 seconds — ABNORMAL HIGH (ref 24–36)
aPTT: 49 seconds — ABNORMAL HIGH (ref 24–36)

## 2022-01-15 LAB — MAGNESIUM: Magnesium: 2.4 mg/dL (ref 1.7–2.4)

## 2022-01-15 SURGERY — BIOPSY, BONE
Anesthesia: General | Laterality: Left

## 2022-01-15 MED ORDER — FUROSEMIDE 10 MG/ML IJ SOLN: 60.0000 mg | Freq: Once | INTRAMUSCULAR | Status: DC

## 2022-01-15 MED ORDER — FUROSEMIDE 10 MG/ML IJ SOLN
INTRAMUSCULAR | Status: AC
  Filled 2022-01-15: qty 6

## 2022-01-15 MED ORDER — ORAL CARE MOUTH RINSE
15.0000 mL | OROMUCOSAL | Status: DC
  Administered 2022-01-15 – 2022-01-21 (×68): 15 mL via OROMUCOSAL

## 2022-01-15 MED ORDER — PROPOFOL 1000 MG/100ML IV EMUL
INTRAVENOUS | Status: AC
  Administered 2022-01-15: 20 ug
  Filled 2022-01-15: qty 100

## 2022-01-15 MED ORDER — ACETAMINOPHEN 10 MG/ML IV SOLN
1000.0000 mg | Freq: Once | INTRAVENOUS | Status: AC
  Administered 2022-01-15: 1000 mg via INTRAVENOUS
  Filled 2022-01-15: qty 100

## 2022-01-15 MED ORDER — INSULIN DETEMIR 100 UNIT/ML ~~LOC~~ SOLN
15.0000 [IU] | Freq: Two times a day (BID) | SUBCUTANEOUS | Status: DC
  Filled 2022-01-15: qty 0.15

## 2022-01-15 MED ORDER — CIPROFLOXACIN IN D5W 200 MG/100ML IV SOLN
200.0000 mg | Freq: Two times a day (BID) | INTRAVENOUS | Status: DC
  Administered 2022-01-16: 200 mg via INTRAVENOUS
  Filled 2022-01-15: qty 100

## 2022-01-15 MED ORDER — DEXTROSE 50 % IV SOLN
INTRAVENOUS | Status: AC
  Administered 2022-01-15: 25 mL
  Filled 2022-01-15: qty 50

## 2022-01-15 MED ORDER — DEXMEDETOMIDINE HCL IN NACL 400 MCG/100ML IV SOLN
INTRAVENOUS | Status: AC
  Filled 2022-01-15: qty 100

## 2022-01-15 MED ORDER — VANCOMYCIN HCL 1750 MG/350ML IV SOLN
1750.0000 mg | Freq: Once | INTRAVENOUS | Status: AC
  Administered 2022-01-15: 1750 mg via INTRAVENOUS
  Filled 2022-01-15: qty 350

## 2022-01-15 MED ORDER — FENTANYL 2500MCG IN NS 250ML (10MCG/ML) PREMIX INFUSION
50.0000 ug/h | INTRAVENOUS | Status: DC
  Administered 2022-01-15 – 2022-01-17 (×2): 50 ug/h via INTRAVENOUS
  Filled 2022-01-15: qty 250

## 2022-01-15 MED ORDER — FENTANYL CITRATE PF 50 MCG/ML IJ SOSY
50.0000 ug | PREFILLED_SYRINGE | Freq: Once | INTRAMUSCULAR | Status: AC
  Administered 2022-01-15: 50 ug via INTRAVENOUS

## 2022-01-15 MED ORDER — DEXMEDETOMIDINE HCL IN NACL 400 MCG/100ML IV SOLN
0.4000 ug/kg/h | INTRAVENOUS | Status: DC
  Administered 2022-01-15: 0.7 ug/kg/h via INTRAVENOUS
  Administered 2022-01-18: 0.4 ug/kg/h via INTRAVENOUS
  Filled 2022-01-15: qty 100

## 2022-01-15 MED ORDER — ETOMIDATE 2 MG/ML IV SOLN
INTRAVENOUS | Status: AC
  Administered 2022-01-15: 20 mg
  Filled 2022-01-15: qty 10

## 2022-01-15 MED ORDER — INSULIN DETEMIR 100 UNIT/ML ~~LOC~~ SOLN
8.0000 [IU] | Freq: Two times a day (BID) | SUBCUTANEOUS | Status: DC
  Administered 2022-01-15 – 2022-01-16 (×2): 8 [IU] via SUBCUTANEOUS
  Filled 2022-01-15 (×3): qty 0.08

## 2022-01-15 MED ORDER — INSULIN DETEMIR 100 UNIT/ML ~~LOC~~ SOLN
15.0000 [IU] | Freq: Two times a day (BID) | SUBCUTANEOUS | Status: DC
  Administered 2022-01-15: 15 [IU] via SUBCUTANEOUS
  Filled 2022-01-15 (×3): qty 0.15

## 2022-01-15 MED ORDER — NOREPINEPHRINE 4 MG/250ML-% IV SOLN
INTRAVENOUS | Status: AC
  Filled 2022-01-15: qty 250

## 2022-01-15 MED ORDER — INFLUENZA VAC SPLIT QUAD 0.5 ML IM SUSY: 0.5000 mL | PREFILLED_SYRINGE | INTRAMUSCULAR | Status: DC | PRN

## 2022-01-15 MED ORDER — VANCOMYCIN VARIABLE DOSE PER UNSTABLE RENAL FUNCTION (PHARMACIST DOSING): Status: DC

## 2022-01-15 MED ORDER — INSULIN ASPART 100 UNIT/ML IJ SOLN
1.0000 [IU] | INTRAMUSCULAR | Status: DC
  Administered 2022-01-15 (×2): 3 [IU] via SUBCUTANEOUS
  Administered 2022-01-15 – 2022-01-16 (×3): 1 [IU] via SUBCUTANEOUS
  Administered 2022-01-16: 3 [IU] via SUBCUTANEOUS
  Administered 2022-01-16: 2 [IU] via SUBCUTANEOUS

## 2022-01-15 MED ORDER — CIPROFLOXACIN IN D5W 200 MG/100ML IV SOLN
200.0000 mg | Freq: Once | INTRAVENOUS | Status: AC
  Administered 2022-01-15: 200 mg via INTRAVENOUS
  Filled 2022-01-15: qty 100

## 2022-01-15 MED ORDER — DEXTROSE 10 % IV SOLN: INTRAVENOUS | Status: DC | PRN

## 2022-01-15 MED ORDER — SUCCINYLCHOLINE CHLORIDE 200 MG/10ML IV SOSY
PREFILLED_SYRINGE | INTRAVENOUS | Status: AC
  Administered 2022-01-15: 120 mg
  Filled 2022-01-15: qty 10

## 2022-01-15 MED ORDER — PROPOFOL 1000 MG/100ML IV EMUL
INTRAVENOUS | Status: AC
  Administered 2022-01-15: 16 ug/kg/min via INTRAVENOUS
  Filled 2022-01-15: qty 100

## 2022-01-15 MED ORDER — INSULIN ASPART 100 UNIT/ML IJ SOLN
5.0000 [IU] | INTRAMUSCULAR | Status: DC
  Administered 2022-01-15 – 2022-01-16 (×6): 5 [IU] via SUBCUTANEOUS

## 2022-01-15 MED ORDER — PROPOFOL 1000 MG/100ML IV EMUL
0.0000 ug/kg/min | INTRAVENOUS | Status: DC
  Administered 2022-01-16: 10 ug/kg/min via INTRAVENOUS
  Administered 2022-01-16: 16 ug/kg/min via INTRAVENOUS
  Administered 2022-01-17 (×2): 10 ug/kg/min via INTRAVENOUS
  Filled 2022-01-15 (×4): qty 100

## 2022-01-15 MED ORDER — FENTANYL 2500MCG IN NS 250ML (10MCG/ML) PREMIX INFUSION
INTRAVENOUS | Status: AC
  Filled 2022-01-15: qty 250

## 2022-01-15 MED ORDER — DEXTROSE 50 % IV SOLN
INTRAVENOUS | Status: AC
  Administered 2022-01-15: 50 mL
  Filled 2022-01-15: qty 50

## 2022-01-15 MED ORDER — NOREPINEPHRINE 4 MG/250ML-% IV SOLN
0.0000 ug/min | INTRAVENOUS | Status: DC
  Administered 2022-01-15: 2 ug/min via INTRAVENOUS

## 2022-01-15 MED ORDER — PNEUMOCOCCAL 20-VAL CONJ VACC 0.5 ML IM SUSY: 0.5000 mL | PREFILLED_SYRINGE | INTRAMUSCULAR | Status: DC | PRN

## 2022-01-15 MED ORDER — FUROSEMIDE 10 MG/ML IJ SOLN
40.0000 mg | Freq: Once | INTRAMUSCULAR | Status: AC
  Administered 2022-01-15: 40 mg via INTRAVENOUS
  Filled 2022-01-15: qty 4

## 2022-01-15 MED ORDER — FUROSEMIDE 10 MG/ML IJ SOLN
60.0000 mg | Freq: Once | INTRAMUSCULAR | Status: AC
  Administered 2022-01-15: 60 mg via INTRAVENOUS

## 2022-01-15 MED ORDER — FENTANYL BOLUS VIA INFUSION: 50.0000 ug | INTRAVENOUS | Status: DC | PRN

## 2022-01-15 NOTE — Progress Notes (Signed)
Evening rounds  No events, now on levophed to maintain sedation level. CXR cleared with PEEP indicating much of this is edema; however some lingering R sided airspace disease and BAL argues for concurrent aspiration.  Continue diuresis, vent support, and Abx as ordered.  Erskine Emery MD PCCM

## 2022-01-15 NOTE — Progress Notes (Signed)
PT Cancellation Note  Patient Details Name: Lauren [name] MRN: [id number] DOB: Aug 21, 1980   Cancelled Treatment:    Reason Eval/Treat Not Completed: Medical issues which prohibited therapy this afternoon. Pt with worsening respiratory status this afternoon resulting in intubation. Will continue to follow and re-evaluate as medically appropriate.   [place] Carbo, PT, DPT   Acute Rehabilitation Department   Sandra Cockayne 01/15/2022, 3:15 PM

## 2022-01-15 NOTE — Procedures (Signed)
Central Venous Catheter Insertion Procedure Note  Lauren [name]  [id number]  07/21/1980  Date:01/15/22  Time:5:39 PM   Provider Performing:Daniyla Pfahler Cipriano Mile supervising Sabino Dick MS4  Procedure: Insertion of Non-tunneled Central Venous Catheter(36556) with US guidance (37357)   Indication(s) Medication administration  Consent Verbally over phone  Anesthesia Topical only with 1% lidocaine   Timeout Verified patient identification, verified procedure, site/side was marked, verified correct patient position, special equipment/implants available, medications/allergies/relevant history reviewed, required imaging and test results available.  Sterile Technique Maximal sterile technique including full sterile barrier drape, hand hygiene, sterile gown, sterile gloves, mask, hair covering, sterile ultrasound probe cover (if used).  Procedure Description Area of catheter insertion was cleaned with chlorhexidine and draped in sterile fashion.  With real-time ultrasound guidance a central venous catheter was placed into the left internal jugular vein. Nonpulsatile blood flow and easy flushing noted in all ports.  The catheter was sutured in place and sterile dressing applied.  Complications/Tolerance None; patient tolerated the procedure well. Chest X-ray is ordered to verify placement for internal jugular or subclavian cannulation.   Chest x-ray is not ordered for femoral cannulation.  EBL Minimal  Specimen(s) None

## 2022-01-15 NOTE — Progress Notes (Signed)
   Patient reintubated today.  Will hold off for now on bedside bone biopsy of the left fourth metatarsal as requested by infectious disease.  We will plan to round tomorrow and discuss again with husband and daughter.  In the meantime continue broad-spectrum ABX as per infectious disease  Edrick Kins, DPM [facility]  Dr. Edrick Kins, DPM    [address],  [address]                Office [phone]  Fax [phone]

## 2022-01-15 NOTE — Progress Notes (Addendum)
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 4/30 cultures grew group B strep and Finegoldia magna. Has been following podiatry postoperatively. Last visit showed some dehiscence and repeat MRI of left foot concern for osteomyelitis of fourth metatarsal.  -9/2 MRI of left foot concern for osteomyelitis of left fourth metatarsal head and likely of adjacent proximal phalanx, trace joint effusion of fourth MTP, postsurgical changes of partial fifth ray amputation -9/8 Blood culture NG -been on Augmentin for past month (dispensed for 28 days starting on 8/14) -afebrile overnight, WBC increasing  -stop Augmentin since not responding so possibly resistant or other bacterial involvement -left foot biopsy today by podiatry -repeat blood cultures  #Fever #Asphasia  Tmax of 101.2 on 9/13, concern for aspiration event. Afebrile overnight. Worsening respiratory status with diffuse lung opacities on CXR. Pending repeat blood cultures.    Recommendations: -discontinue Augmentin -pending biopsy of left fourth metatarsal by podiatry today for targeted antibiotic therapy -repeat blood cultures   Microbiology:   Antibiotics: -Cefazolin (9/8) -Augmentin (9/9)  Cultures: 9/8 Blood: NG 9/15 Blood: pending  9/9  Tracheal aspirate: normal respiratory flora  SUBJECTIVE: Patient remains aphasic but able to nod some. Afebrile. More short  of breath this morning. Switched to BiPAP.   Review of Systems: Review of Systems  Respiratory:  Positive for shortness of breath.     Scheduled Meds:  arformoterol  15 mcg Nebulization BID   aspirin  81 mg Oral Daily   Or   aspirin  81 mg Per Tube Daily   atorvastatin  40 mg Per Tube Daily   budesonide (PULMICORT) nebulizer solution  0.5 mg Nebulization BID   Chlorhexidine Gluconate Cloth  6 each Topical Daily   docusate  100 mg Per Tube BID   feeding supplement (PROSource TF20)  60 mL Per Tube Daily   free water  300 mL Per Tube Q4H   furosemide       furosemide  60 mg Intravenous Once   insulin aspart  1-3 Units Subcutaneous Q4H   insulin aspart  5 Units Subcutaneous Q4H   insulin detemir  15 Units Subcutaneous Q12H   isosorbide mononitrate  20 mg Per Tube BID   levothyroxine  75 mcg Per Tube Q0600   methylPREDNISolone (SOLU-MEDROL) injection  40 mg Intravenous Daily   metoprolol tartrate  25 mg Per Tube BID   nicotine  21 mg Transdermal Daily   pantoprazole  40 mg Per Tube Daily   polyethylene glycol  17 g Per Tube Daily   revefenacin  175 mcg Nebulization Daily   ticagrelor  90 mg Oral BID   Or   ticagrelor  90 mg Per Tube BID   Continuous Infusions:  dexmedetomidine (PRECEDEX) IV infusion 0.7 mcg/kg/hr (01/15/22 1103)   dextrose     feeding supplement (GLUCERNA 1.5 CAL) 55 mL/hr at 01/15/22 0400   heparin 1,400 Units/hr (01/15/22 0400)   nitroGLYCERIN 65 mcg/min (  01/15/22 0621)   PRN Meds:.acetaminophen **OR** acetaminophen (TYLENOL) oral liquid 160 mg/5 mL **OR** acetaminophen, dextrose, docusate sodium, furosemide, ipratropium-albuterol, labetalol, mouth rinse, polyethylene glycol, senna-docusate Allergies  Allergen Reactions   Wellbutrin [Bupropion] Hives   Cefepime Rash    Tolerates penicilllin   Ciprofloxacin Hcl Hives and Rash    Hives/rash at injection site    Tape Rash    OBJECTIVE: Vitals:   01/15/22 0946 01/15/22 1000 01/15/22 1015 01/15/22 1030   BP: (!) 138/92 (!) 153/88  (!) 136/95  Pulse: (!) 109 (!) 107 (!) 118 (!) 116  Resp:  (!) 40 (!) 36 (!) 38  Temp:      TempSrc:      SpO2: 94% 95% 96% 95%  Weight:      Height:       Body mass index is 32.5 kg/m.  Physical Exam Constitutional:      Appearance: She is ill-appearing.  HENT:     Head: Normocephalic and atraumatic.  Cardiovascular:     Rate and Rhythm: Tachycardia present.  Pulmonary:     Effort: Tachypnea present.  Musculoskeletal:     Left Lower Extremity: Left leg is amputated below ankle. (partial fifth ray amputation, wrapped) Neurological:     Mental Status: She is alert.     Comments: Aphasia, able to nod to some questions     Lab Results Lab Results  Component Value Date   WBC 27.2 (H) 01/15/2022   HGB 7.5 (L) 01/15/2022   HCT 23.2 (L) 01/15/2022   MCV 92.8 01/15/2022   PLT 344 01/15/2022    Lab Results  Component Value Date   CREATININE 2.19 (H) 01/15/2022   BUN 53 (H) 01/15/2022   NA 144 01/15/2022   K 4.2 01/15/2022   CL 109 01/15/2022   CO2 25 01/15/2022    Lab Results  Component Value Date   ALT 19 01/08/2022   AST 34 01/08/2022   ALKPHOS 67 01/08/2022   BILITOT 0.5 01/08/2022       Angelique Blonder, DO Internal Medicine PGY-1 01/15/2022, 11:05 AM

## 2022-01-15 NOTE — Procedures (Addendum)
Bronchoscopy Procedure Note  Lauren [name]  [id number]  [birth year]  Date:01/15/22  Time:5:37 PM   Provider Performing:Rueben Kassim Cipriano Mile supervising Sabino Dick MS4  Procedure(s):  Flexible bronchoscopy with bronchial alveolar lavage [phone])  Indication(s) ARDS  Consent Discussed over phone  Anesthesia In place for intubation   Time Out Verified patient identification, verified procedure, site/side was marked, verified correct patient position, special equipment/implants available, medications/allergies/relevant history reviewed, required imaging and test results available.   Sterile Technique Usual hand hygiene, masks, gowns, and gloves were used   Procedure Description Bronchoscope advanced through endotracheal tube and into airway.  Airways were examined down to subsegmental level with findings noted below.   Following diagnostic evaluation, BAL(s) performed in RML with normal saline and return of plug-filled cloudy fluid  Findings:  ETT in good position BAL c/w aspirated material  Complications/Tolerance None; patient tolerated the procedure well. Chest X-ray is not needed post procedure.   EBL Minimal   Specimen(s) BAL RML

## 2022-01-15 NOTE — Progress Notes (Signed)
Rounding Note    Patient Name: Lauren [name] Date of Encounter: 01/15/2022  [facility] Cardiologist: Carlyle Dolly, MD   Subjective   Patient seen and examined at her bedside.  Turn of events overnight she was not transferred to the floors at this point for downgrade.  Suspect that she may have aspirated now with some respiratory distress.  Inpatient Medications    Scheduled Meds:  arformoterol  15 mcg Nebulization BID   aspirin  81 mg Oral Daily   Or   aspirin  81 mg Per Tube Daily   atorvastatin  40 mg Per Tube Daily   budesonide (PULMICORT) nebulizer solution  0.5 mg Nebulization BID   Chlorhexidine Gluconate Cloth  6 each Topical Daily   docusate  100 mg Per Tube BID   feeding supplement (PROSource TF20)  60 mL Per Tube Daily   free water  300 mL Per Tube Q4H   furosemide       furosemide  60 mg Intravenous Once   insulin aspart  1-3 Units Subcutaneous Q4H   insulin aspart  5 Units Subcutaneous Q4H   insulin detemir  15 Units Subcutaneous Q12H   isosorbide mononitrate  20 mg Per Tube BID   levothyroxine  75 mcg Per Tube Q0600   methylPREDNISolone (SOLU-MEDROL) injection  40 mg Intravenous Daily   metoprolol tartrate  25 mg Per Tube BID   nicotine  21 mg Transdermal Daily   pantoprazole  40 mg Per Tube Daily   polyethylene glycol  17 g Per Tube Daily   revefenacin  175 mcg Nebulization Daily   ticagrelor  90 mg Oral BID   Or   ticagrelor  90 mg Per Tube BID   Continuous Infusions:  dexmedetomidine (PRECEDEX) IV infusion     dextrose     feeding supplement (GLUCERNA 1.5 CAL) 55 mL/hr at 01/15/22 0400   heparin 1,400 Units/hr (01/15/22 0400)   nitroGLYCERIN 65 mcg/min (01/15/22 0621)   PRN Meds: acetaminophen **OR** acetaminophen (TYLENOL) oral liquid 160 mg/5 mL **OR** acetaminophen, dextrose, docusate sodium, furosemide, ipratropium-albuterol, labetalol, mouth rinse, polyethylene glycol, senna-docusate   Vital Signs    Vitals:   01/15/22  0700 01/15/22 0737 01/15/22 0800 01/15/22 0900  BP: (!) 143/84   (!) 145/89  Pulse: 97   (!) 106  Resp: (!) 41   (!) 44  Temp:   98.9 F (37.2 C)   TempSrc:   Axillary   SpO2: 100% 96%  94%  Weight:      Height:        Intake/Output Summary (Last 24 hours) at 01/15/2022 0944 Last data filed at 01/15/2022 0630 Gross per 24 hour  Intake 3493.42 ml  Output 2000 ml  Net 1493.42 ml      01/15/2022    5:00 AM 01/14/2022    5:00 AM 01/13/2022    9:08 AM  Last 3 Weights  Weight (lbs) 195 lb 5.2 oz 197 lb 1.5 oz 197 lb 1.5 oz  Weight (kg) 88.6 kg 89.4 kg 89.4 kg      Telemetry    Sinus rhythm - Personally Reviewed  ECG    Sinus rhythm, with occasional PVC new but other findings are not new - Personally Reviewed  Physical Exam   GEN: No acute distress.   Neck: No JVD Cardiac: RRR, no murmurs, rubs, or gallops.  Respiratory: Clear to auscultation bilaterally. GI: Soft, nontender, non-distended  MS: No edema; No deformity. Neuro:  Nonfocal  Psych: Normal affect  Labs    High Sensitivity Troponin:   Recent Labs  Lab 01/08/22 1920 01/11/22 1001 01/12/22 0027 01/12/22 0203 01/13/22 1059  TROPONINIHS 860* 3,869* 3,807* 3,512* 2,880*     Chemistry Recent Labs  Lab 01/08/22 1326 01/08/22 1345 01/08/22 2014 01/08/22 2049 01/13/22 0429 01/14/22 0556 01/14/22 0653 01/14/22 1410 01/15/22 0714  NA 137   < > 135   < > 148* 148*  --  142 144  K 4.1   < > 5.8*   < > 3.5 3.6  --  5.2* 4.2  CL 110   < > 113*   < > 111 112*  --  108 109  CO2 17*  --  14*   < > 28 25  --  23 25  GLUCOSE 258*   < > 483*   < > 81 137*  --  429* 124*  BUN 26*   < > 28*   < > 53* 48*  --  54* 53*  CREATININE 1.94*   < > 2.15*   < > 2.73* 2.47*  --  2.48* 2.19*  CALCIUM 8.2*  --  7.0*   < > 8.5* 8.2*  --  7.7* 8.2*  MG  --   --  1.9   < > 2.4  --  2.4  --  2.4  PROT 5.6*  --  4.4*  --   --   --   --   --   --   ALBUMIN 2.6*  --  2.1*  --   --   --   --   --   --   AST 18  --  34  --   --    --   --   --   --   ALT 14  --  19  --   --   --   --   --   --   ALKPHOS 73  --  67  --   --   --   --   --   --   BILITOT 0.4  --  0.5  --   --   --   --   --   --   GFRNONAA 33*  --  29*   < > 22* 25*  --  25* 29*  ANIONGAP 10  --  8   < > 9 11  --  11 10   < > = values in this interval not displayed.    Lipids  Recent Labs  Lab 01/09/22 0503  CHOL 85  TRIG 154*  156*  HDL 15*  LDLCALC 39  CHOLHDL 5.7    Hematology Recent Labs  Lab 01/13/22 0429 01/14/22 0556 01/15/22 0714  WBC 22.0* 22.6* 27.2*  RBC 2.84* 2.55* 2.50*  HGB 8.7* 7.7* 7.5*  HCT 26.3* 23.7* 23.2*  MCV 92.6 92.9 92.8  MCH 30.6 30.2 30.0  MCHC 33.1 32.5 32.3  RDW 14.2 14.7 14.3  PLT 353 347 344   Thyroid No results for input(s): "TSH", "FREET4" in the last 168 hours.  BNPNo results for input(s): "BNP", "PROBNP" in the last 168 hours.  DDimer No results for input(s): "DDIMER" in the last 168 hours.   Radiology    DG CHEST PORT 1 VIEW  Result Date: 01/15/2022 CLINICAL DATA:  Shortness of breath EXAM: PORTABLE CHEST 1 VIEW COMPARISON:  Chest radiograph 01/11/2022 FINDINGS: The enteric catheter courses into the stomach and off the field of view. The cardiomediastinal silhouette  is grossly stable. There are new extensive opacities throughout both lungs. There is no significant pleural effusion. There is no pneumothorax There is no acute osseous abnormality. IMPRESSION: New extensive opacities throughout both lungs may reflect multifocal infection, pulmonary edema, ARDS, or combination thereof. Electronically Signed   By: Valetta Mole M.D.   On: 01/15/2022 08:30   VAS [name] ABI WITH/WO TBI  Result Date: 01/14/2022  LOWER EXTREMITY DOPPLER STUDY Patient Name:  Lauren [name]  Date of Exam:   01/14/2022 Medical Rec #: [id number]        Accession #:    [id number] Date of Birth: 12-03-1980        Patient Gender: F Patient Age:   41 years Exam Location:  [facility] Procedure:      VAS [name] ABI WITH/WO TBI  Referring Phys: ADAM MCDONALD --------------------------------------------------------------------------------  Indications: Ulceration. Non-healing, left foot. Left great toe amputation              09-02-2021. High Risk Factors: Hypertension, hyperlipidemia, Diabetes, past history of                    smoking, prior MI, coronary artery disease, prior CVA.  Comparison Study: 08-23-2021 ABI w/ TBI showed RIGHT 0.83/0.66 and LEFT                   1.04/0.36; however, history of non-compressible LEFT ABI on                   02/12/2020. Performing Technologist: Darlin Coco RDMS RVT  Examination Guidelines: A complete evaluation includes at minimum, Doppler waveform signals and systolic blood pressure reading at the level of bilateral brachial, anterior tibial, and posterior tibial arteries, when vessel segments are accessible. Bilateral testing is considered an integral part of a complete examination. Photoelectric Plethysmograph (PPG) waveforms and toe systolic pressure readings are included as required and additional duplex testing as needed. Limited examinations for reoccurring indications may be performed as noted.  ABI Findings: +---------+------------------+-----+---------+--------+ Right    Rt Pressure (mmHg)IndexWaveform Comment  +---------+------------------+-----+---------+--------+ Brachial 127                    triphasic         +---------+------------------+-----+---------+--------+ PTA      180               1.42 biphasic          +---------+------------------+-----+---------+--------+ DP       109               0.86 biphasic          +---------+------------------+-----+---------+--------+ Great Toe62                0.49 Abnormal          +---------+------------------+-----+---------+--------+ +---------+-----------------+-----+-------------------+------------------------+ Left     Lt Pressure      IndexWaveform           Comment                           (mmHg)                                                             +---------+-----------------+-----+-------------------+------------------------+ Brachial 122                                                               +---------+-----------------+-----+-------------------+------------------------+  PTA      103              0.81 dampened monophasicPreviously                                                                 non-compressible         +---------+-----------------+-----+-------------------+------------------------+ DP       106              0.83 monophasic         Previously                                                                 non-compressible         +---------+-----------------+-----+-------------------+------------------------+ Great Toe                                         Amputation               +---------+-----------------+-----+-------------------+------------------------+ +-------+-----------+-----------+------------+------------+ ABI/TBIToday's ABIToday's TBIPrevious ABIPrevious TBI +-------+-----------+-----------+------------+------------+ Right  1.42       0.49       0.83        0.66         +-------+-----------+-----------+------------+------------+ Left   0.83       amputation 1.04        0.36         +-------+-----------+-----------+------------+------------+  Right ABIs appear decreased compared to prior study on 08-23-2021. Left ABIs appear decreased compared to prior study on 08-23-2021.  Summary: Right: Resting right ankle-brachial index indicates noncompressible right lower extremity arteries. The right toe-brachial index is abnormal. Left: Resting left ankle-brachial index indicates noncompressible left lower extremity arteries. Although ankle-brachial indices are within mild (0.80-0.94) range, patient history of non-compressible arteries on previous exams coupled with abnormal, monophasic waveforms suggests  progression of arterial disease. *See table(s) above for measurements and observations.  Electronically signed by Harold Barban MD on 01/14/2022 at 11:55:38 PM.    Final    ECHOCARDIOGRAM COMPLETE  Result Date: 01/14/2022    ECHOCARDIOGRAM REPORT   Patient Name:   Lauren [name] Date of Exam: 01/14/2022 Medical Rec #:  [id number]       Height:       65.0 in Accession #:    [id number]      Weight:       197.1 lb Date of Birth:  [birth year]       BSA:          1.966 m Patient Age:    57 years        BP:           139/76 mmHg Patient Gender: F               HR:           87 bpm. Exam Location:  Inpatient Procedure: 2D Echo, Color Doppler, Cardiac Doppler and Intracardiac  Opacification Agent Indications:    Dilated Cardiomyopathy  History:        Patient has prior history of Echocardiogram examinations, most                 recent 01/10/2022. CHF, CAD, Stroke; Risk Factors:Diabetes.  Sonographer:    Memory Argue Referring Phys: [id number] [place]  1. Left ventricular ejection fraction, by estimation, is 35 to 40%. Left ventricular ejection fraction by 2D MOD biplane is 37.1 %. The left ventricle has moderately decreased function. The left ventricle demonstrates regional wall motion abnormalities (see scoring diagram/findings for description). There is mild left ventricular hypertrophy. Left ventricular diastolic parameters are consistent with Grade II diastolic dysfunction (pseudonormalization). Elevated left ventricular end-diastolic pressure. The E/e' is 80. Wall motion abnormality suggestive of LAD territory infarct or less likely Takatsubo cardiomyopathy.  2. Right ventricular systolic function is normal. The right ventricular size is normal. Tricuspid regurgitation signal is inadequate for assessing PA pressure.  3. The mitral valve is abnormal. Trivial mitral valve regurgitation.  4. The inferior vena cava is normal in size with <50% respiratory variability, suggesting right atrial  pressure of 8 mmHg.  5. The aortic valve is tricuspid. Aortic valve regurgitation is not visualized. Comparison(s): Changes from prior study are noted. 01/10/2022: LVEF 30-35%, mid to apical ventricular wall thinning and severe hypokinesis to akinesis. FINDINGS  Left Ventricle: No mural thrombus with Definity contrast. Left ventricular ejection fraction, by estimation, is 35 to 40%. Left ventricular ejection fraction by 2D MOD biplane is 37.1 %. The left ventricle has moderately decreased function. The left ventricle demonstrates regional wall motion abnormalities. Definity contrast agent was given IV to delineate the left ventricular endocardial borders. The left ventricular internal cavity size was normal in size. There is mild left ventricular hypertrophy. Left ventricular diastolic parameters are consistent with Grade II diastolic dysfunction (pseudonormalization). Elevated left ventricular end-diastolic pressure. The E/e' is 68.  LV Wall Scoring: The mid and distal anterior wall, mid and distal lateral wall, mid and distal anterior septum, entire apex, mid and distal inferior wall, mid anterolateral segment, and mid inferoseptal segment are hypokinetic. The basal anteroseptal segment, basal inferolateral segment, basal anterolateral segment, basal anterior segment, basal inferior segment, and basal inferoseptal segment are normal. Right Ventricle: The right ventricular size is normal. No increase in right ventricular wall thickness. Right ventricular systolic function is normal. Tricuspid regurgitation signal is inadequate for assessing PA pressure. Left Atrium: Left atrial size was normal in size. Right Atrium: Right atrial size was normal in size. Pericardium: Trivial pericardial effusion is present. The pericardial effusion is posterior to the left ventricle. Mitral Valve: The mitral valve is abnormal. There is mild thickening of the posterior and anterior mitral valve leaflet(s). Trivial mitral valve  regurgitation. Tricuspid Valve: The tricuspid valve is grossly normal. Tricuspid valve regurgitation is trivial. Aortic Valve: The aortic valve is tricuspid. Aortic valve regurgitation is not visualized. Aortic valve mean gradient measures 3.0 mmHg. Aortic valve peak gradient measures 6.2 mmHg. Aortic valve area, by VTI measures 2.65 cm. Pulmonic Valve: The pulmonic valve was normal in structure. Pulmonic valve regurgitation is not visualized. Aorta: The aortic root and ascending aorta are structurally normal, with no evidence of dilitation. Venous: The inferior vena cava is normal in size with less than 50% respiratory variability, suggesting right atrial pressure of 8 mmHg. IAS/Shunts: No atrial level shunt detected by color flow Doppler.  LEFT VENTRICLE PLAX 2D  Biplane EF (MOD) LVIDd:         4.83 cm         LV Biplane EF:   Left LVIDs:         3.55 cm                          ventricular LV PW:         1.33 cm                          ejection LV IVS:        1.27 cm                          fraction by LVOT diam:     2.00 cm                          2D MOD LV SV:         72                               biplane is LV SV Index:   37                               37.1 %. LVOT Area:     3.14 cm                                Diastology                                LV e' medial:    5.11 cm/s LV Volumes (MOD)               LV E/e' medial:  21.5 LV vol d, MOD    193.0 ml      LV e' lateral:   6.20 cm/s A2C:                           LV E/e' lateral: 17.7 LV vol d, MOD    208.0 ml A4C: LV vol s, MOD    113.0 ml A2C: LV vol s, MOD    131.0 ml A4C: LV SV MOD A2C:   80.0 ml LV SV MOD A4C:   208.0 ml LV SV MOD BP:    77.1 ml RIGHT VENTRICLE RV S prime:     12.10 cm/s LEFT ATRIUM             Index LA diam:        3.95 cm 2.01 cm/m LA Vol (A2C):   66.5 ml 33.83 ml/m LA Vol (A4C):   42.7 ml 21.72 ml/m LA Biplane Vol: 54.7 ml 27.83 ml/m  AORTIC VALVE AV Area (Vmax):    2.51 cm AV Area (Vmean):    2.50 cm AV Area (VTI):     2.65 cm AV Vmax:           124.00 cm/s AV Vmean:          86.000 cm/s AV VTI:            0.271 m AV  Peak Grad:      6.2 mmHg AV Mean Grad:      3.0 mmHg LVOT Vmax:         99.20 cm/s LVOT Vmean:        68.300 cm/s LVOT VTI:          0.229 m LVOT/AV VTI ratio: 0.85 MITRAL VALVE MV Area (PHT): 4.63 cm     SHUNTS MV Decel Time: 164 msec     Systemic VTI:  0.23 m MR Peak grad: 46.2 mmHg     Systemic Diam: 2.00 cm MR Vmax:      340.00 cm/s MV E velocity: 110.00 cm/s MV A velocity: 50.90 cm/s MV E/A ratio:  2.16 Lyman Bishop MD Electronically signed by Lyman Bishop MD Signature Date/Time: 01/14/2022/2:49:57 PM    Final    MR BRAIN WO CONTRAST  Result Date: 01/14/2022 CLINICAL DATA:  Neuro deficit, acute, stroke suspected. 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 EXAM: MRI HEAD WITHOUT CONTRAST TECHNIQUE: Multiplanar, multiecho pulse sequences of the brain and surrounding structures were obtained without intravenous contrast. COMPARISON:  Head CT 01/11/2022 and MRI 01/09/2022 FINDINGS: The examination had to be discontinued prior to completion due to patient condition. Axial and coronal diffusion, axial T2* gradient echo, and axial T2 FLAIR sequences were obtained. The FLAIR sequence is severely motion degraded. Numerous patchy infarcts throughout the right MCA territory are unchanged in extent compared to the prior MRI, with the most confluent regions of infarct again noted to be near watershed areas. Small infarcts in the corpus callosum, right cerebral peduncle, and left frontal white matter are also unchanged. No definite new infarct or sizable intracranial hemorrhage is identified. There is no midline shift or hydrocephalus. Chronic white matter disease involving the cerebral hemispheres and brainstem are not well evaluated on this truncated, motion degraded study. IMPRESSION: 1. Motion degraded, incomplete examination. 2. Unchanged  numerous infarcts as above, primarily involving the right MCA territory. 3. No definite new intracranial abnormality. Electronically Signed   By: [name] M.D.   On: 01/14/2022 13:36    Cardiac Studies   TTE 01/10/2022 IMPRESSIONS   1. There is akinesis of the mid-to-apical septal LV segments, all apical segments, and apex. There is significant thinning of the mid-to-apical septal segments and apex likely represents scarring/infarct. The basal-to-mid inferior and basal septal  segments are hypokinetic.   2. No LV thrombus visualized on definity imaging.   3. Left ventricular ejection fraction, by estimation, is 30 to 35%. The left ventricle has moderately decreased function. The left ventricle demonstrates regional wall motion abnormalities (see scoring diagram/findings for description). The left  ventricular internal cavity size was mildly-to-moderately dilated. There  is mild concentric left ventricular hypertrophy. Indeterminate diastolic filling due to E-A fusion.   4. Right ventricular systolic function is normal. The right ventricular size is normal.   5. The mitral valve is normal in structure. Trivial mitral valve regurgitation.   6. The aortic valve is tricuspid. Aortic valve regurgitation is not visualized. No aortic stenosis is present.   7. The inferior vena cava is dilated in size with >50% respiratory variability, suggesting right atrial pressure of 8 mmHg.   8. Agitated saline contrast bubble study was negative, with no evidence of any interatrial shunt.   Comparison(s): Compared to prior TTE on 10/2021, the EF has dropped to 30-35% with similar wall motion.   FINDINGS   Left Ventricle: There is akinesis of the mid-to-apical septal LV  segments,  all apical segments, and apex. There is significant thinning of  the mid-to-apical septal segments and apex which likely represents  scarring/infarct. The basal-to-mid inferior and   basal septal segments are hypokinetic. No LV  thrombus visualized. Left  ventricular ejection fraction, by estimation, is 30 to 35%. The left  ventricle has moderately decreased function. The left ventricle  demonstrates regional wall motion abnormalities.  Definity contrast agent was given IV to delineate the left ventricular  endocardial borders. The left ventricular internal cavity size was mildly  to moderately dilated. There is mild concentric left ventricular  hypertrophy. Indeterminate diastolic filling   due to E-A fusion.   Right Ventricle: The right ventricular size is normal. No increase in  right ventricular wall thickness. Right ventricular systolic function is  normal.   Left Atrium: Left atrial size was normal in size.   Right Atrium: Right atrial size was normal in size.   Pericardium: There is no evidence of pericardial effusion.   Mitral Valve: The mitral valve is normal in structure. Trivial mitral  valve regurgitation.   Tricuspid Valve: The tricuspid valve is normal in structure. Tricuspid  valve regurgitation is trivial.   Aortic Valve: The aortic valve is tricuspid. Aortic valve regurgitation is  not visualized. No aortic stenosis is present. Aortic valve mean gradient  measures 4.0 mmHg. Aortic valve peak gradient measures 7.4 mmHg. Aortic  valve area, by VTI measures 2.86  cm.   Pulmonic Valve: The pulmonic valve was normal in structure. Pulmonic valve  regurgitation is trivial.   Aorta: The aortic root is normal in size and structure.   Venous: The inferior vena cava is dilated in size with greater than 50%  respiratory variability, suggesting right atrial pressure of 8 mmHg.   IAS/Shunts: The atrial septum is grossly normal. Agitated saline contrast  was given intravenously to evaluate for intracardiac shunting. Agitated  saline contrast bubble study was negative, with no evidence of any  interatrial shunt.   Patient Profile     41 y.o. female with history of MI w/ cath 2015 med rx for  mod-severe dz D1,OM2, RCA (u/a to do PCI), DM1, HTN, chronic systolic CHF 61/6073 LVEF 35-40%, grade III dd, apex akinetic, hypothyroidism, CKD stage IIIa, HLD, class II obesity, hx CVA 2020, tob use (quit at time of CVA).  Assessment & Plan   Coronary artery disease Ischemic cardiomyopathy Heart failure reduced ejection fraction Acute CVA Anemia Acute exacerbation of COPD Diabetes mellitus  CAD -during this hospitalization troponin peaked at 3869 which is trending down.  Her echo did show some wall motion abnormalities which could be due to cardiac injury or Takotsubo cardiomyopathy given her events she is tolerating her medications.  She is back on her beta-blocker, aspirin and Brilinta, continue the patient Imdur as well as her statin medication.  The patient is a challenge for this patient as she has just recently had significant CVA and would like to hold off for cardiac catheterization at this time.  Her repeat echocardiogram shows slightly improved EF to 35 to 40% which was to be released 30 to 35% with some wall motion abnormalities.  Unfortunately she did not tolerate the transition of nitro drip we will continue it for now.  In terms of her ischemic cardiomyopathy-cannot get her all of her guideline directed medical therapy due to worsening kidney function.  Continue her beta-blocker metoprolol titrate 25 mg twice daily via NG tube.  Echo yesterday EF slightly improved.  Once kidney function can improve  slightly can start the patient on [place], Jardiance as well as Aldactone.  Heart failure with reduced ejection fraction-clinically she does appear to have some volume overload.  She has had a positive despite Lasix dosing yesterday.  Would recommend increasing the Lasix to 60 mg twice a day given the kidney function.  She may also benefit from nephrology evaluation as well.  In terms of her AKI creatinine is improving but slowly today is 2.19.  She also is diffusely wheezing will  defer to the primary team for nebulizer treatments.  Diabetes mellitus Per primary team.  CRITICAL CARE Performed by: Berniece Salines  Total critical care time: 30 minutes. Critical care time was exclusive of separately billable procedures and treating other patients. Critical care was necessary to treat or prevent imminent or life-threatening deterioration. Critical care was time spent personally by me on the following activities: development of treatment plan with patient and/or surrogate as well as nursing, discussions with consultants, evaluation of patient's response to treatment, examination of patient, obtaining history from patient or surrogate, ordering and performing treatments and interventions, ordering and review of laboratory studies, ordering and review of radiographic studies, pulse oximetry and re-evaluation of patient's condition.  Signed, Berniece Salines, DO [place]  01/15/2022 9:44 AM    For questions or updates, please contact [place] Please consult [email or web address] for contact info under    Signed, Berniece Salines, DO  01/15/2022, 9:44 AM

## 2022-01-15 NOTE — Inpatient Diabetes Management (Signed)
Inpatient Diabetes Program Recommendations  AACE/ADA: New Consensus Statement on Inpatient Glycemic Control (2015)  Target Ranges:  Prepandial:   less than 140 mg/dL      Peak postprandial:   less than 180 mg/dL (1-2 hours)      Critically ill patients:  140 - 180 mg/dL   Lab Results  Component Value Date   GLUCAP 126 (H) 01/15/2022   HGBA1C 8.0 (H) 01/09/2022    Review of Glycemic Control  Latest Reference Range & Units 01/14/22 00:21 01/14/22 03:58 01/14/22 08:23 01/14/22 12:00 01/14/22 13:37 01/14/22 15:59  Glucose-Capillary 70 - 99 mg/dL 182 (H) 123 (H) 196 (H) 329 (H) 401 (H) 430 (H)   Diabetes history: DM Type 1 diagnosed at the age of 56 Outpatient Diabetes medications:  Omnipod insulin pump (unsure of settings) (Prior to omnipod patient was taking Semglee 34 units daily and Novolog 4-10 units with meals) Sees Rayetta Pigg, NP in [place], [place] Current orders for Inpatient glycemic control:  IV insulin transitioning to: Novolog 5 units q 4 hours  Novolog 1-3 units q 4 hours Levemir 15 units bid  Solumedrol 40 mg daily (for 5 doses) Glucerna at 55 ml/hr  Inpatient Diabetes Program Recommendations:    -  Change Novolog Correction to 0-20 units Q4 hours  Thanks,  Tama Headings RN, MSN, BC-ADM Inpatient Diabetes Coordinator Team Pager [phone] (8a-5p)

## 2022-01-15 NOTE — Progress Notes (Signed)
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   Patient presented to the ED 9/8 with left sided facial droop, left sided weakness, and slurred speech.  LKW: unknown.  Code stroke initiated. NIHSS: 8.  Imaging revealed: age indeterminate occlusion of proximal right ICA, V3 segment of right vertebral artery, left paraclinoid ICA stenosis. No core infarct or penumbra. Patient went immediately to IR for thrombectomy.  Of note, patient with recent arterial thrombectomy of the right upper extremity due to limb ischemia.  She has had URI x 2 days with recent sick contact.   COVID negative but Rhinovirus positive. Extubated in ICU post procedure.    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  NIHSS 8, to IR  for thrombectomy, intubated.  MRSA PCR +, Rhinovirus + 9/10 PSV wean, moving RUE/RLE, extubated  9/11 ECHO>, CT head negative for acute changes, Trach asp negative,  9/13 started on HFNC 10L overnight 9/14 tmax 101.2, remains on HFNC, MRI, Heme/onc and ID consult  Interim History / Subjective:  Afebrile Improving sCr  Worsening tachypnea/ anxiety with c/o of SOB, remains on HFNC 10L CXR diffuse bilateral opacities  Pending bone biopsy by Podiatry today Remains on Nitro gtt 60  Objective   Blood pressure (!)  136/95, pulse (!) 116, temperature 98.9 F (37.2 C), temperature source Axillary, resp. rate (!) 38, height _0  (1.651 m), weight 88.6 kg, SpO2 95 %. CVP:  [8 mmHg-15 mmHg] 9 mmHg  Vent Mode: BIPAP FiO2 (%):  [40 %] 40 % PEEP:  [6 cmH20] 6 cmH20 Pressure Support:  [6 cmH20] 6 cmH20   Intake/Output Summary (Last 24 hours) at 01/15/2022 1126 Last data filed at 01/15/2022 1040 Gross per 24 hour  Intake 3493.42 ml  Output 2750 ml  Net 743.42 ml   Filed Weights   01/13/22 0908 01/14/22 0500 01/15/22 0500  Weight: 89.4 kg 89.4 kg 88.6 kg   Examination: General:  ill appearing female sitting upright in bed  HEENT: MM pink/moist, pupils 4/reactive Neuro: Awake, nods, aphasic, f/c on RUE/ RLL, flaccid LUE, intermittently will wiggle L toes CV: ST, no murmur PULM:  labored, tachypneic, diffuse rales  GI: soft, bsx4 active  Extremities: warm/dry, mild UE edema, no tibial edema, left foot wrapped/ dry   Labs> K 4.2, sCr 2.48> 2.19, Mag 2.4, WBC 22.6> 27.2, Hgb 7.7> 7.5  Resolved Hospital Problem list     Assessment & Plan:   Acute Hypoxic Respiratory Failure  Rhinovirus Positive  Acute Exacerbation of Suspected COPD / Bronchospasm  Tobacco Abuse  No documented hx of underlying lung disease but at minimum 1ppd smoker. Positive for rhinovirus. Tolerated 3-4L [place] for most of day on 9/12 and 9/13.  Started on 10 L high flow nasal cannula a.m. on 9/14 due to tachypnea. - worsening  tachypnea and SOB this morning.  No change in O2 needs, remained on 10L HFNC.  CXR with diffuse bilateral opacities suspicious for aspiration pneumonitis/ ARDS vs pulmonary edema.  SLP eval yesterday and had some ice chips.  Remains afebrile but WBC increased  - lasix, BiPAP, precedex so patient can tolerate bipap now - high risk for intubation if WOB does not improve> husband aware at bedside.  If intubated will need FOB/ BAL.   - escalate abx> vanc and ideally cefepime but developed systemic rash.  Will discuss  with ID for either zosyn (would like to avoid with sCr) vs meropenem  - cont solumedrol for now  - cont Brovana, Pulmicort, Yupelri nebs - full NPO.  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 Post procedure CT Head negative for ICH. S/p cangrelor. Repeat CTH on 9/11 negative for acute changes -Management per neurology.  Appreciate neurology and IR assistance.   - MRI 9/14< motion degraded, incomplete test, unchanged numerous infarcts, primarily involving the R MCA territory - ASA/ Brilinta  - serial Neuro exams  - ongoing SLP, PT, OT - f/u outpt hematology for hypercoagulable workup.  Recs for lovenox on d/c   AKI on CKD IV Acute Metabolic Acidosis 2/2 DKA  Contrast Induced Nephropathy  Admit sr cr 2.0, DKA on admit resolved. UA unremarkable. AKI suspected to be related to CIN. Diuresed 9/10-12 with good response. Stopped on 9/13 due to hypernatremia.  Restarted 9/14 - sCr improving.  Good UOP> several unmeasured urinary occurrences with purwick but weight is down almost 1kg - Trend BMP / urinary output - Replace electrolytes as indicated - Avoid nephrotoxic agents, ensure adequate renal perfusion - f/u outpt with Nephrology in 4 wks  Normocytic Anemia Multifactorial in setting of blood loss, chronic disease. PRBC 9/10,  -Transfuse PRBC if HBG less than 7 - H/H remains stable - trend on CBC, transfuse for Hgb< 7 - monitor for bleeding, no evidence of   Chronic Systolic & Diastolic CHF  CAD  Troponin elevation Chest Discomfort  HTN ECHO 10/2021 with LVEF 40-45%, G2DDEcho 9/10: LVEF 30 to 35%, left ventricle mildly decreased function, akinesis of the mid to apical septal LV segments, all apical segments, and apex.  Significant thinning of  the mid to apical septal segments and apex reported as likely representing scarring/infarct. Patient with C/O chest pain overnight 9/11>  Started on heparin gtt and NTG GTT by cards. Troponin: 71 > 1.3 K >8 16 > 3.8 K >3.8 K >3.5 K> 2.8K.  Chest pain resumed again on 9/13, nitro resumed.  No significant changes on EKG.  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  -Wound care per podiatry orders> recommend Betadine with dry dressings as needed. - plans for bone barrow biopsy for abx guidance today> pending her  hemodynamic stability - changing abx as above for respiratory coverage, ID aware.    Leukocytosis Suspect secondary to L foot osteo and steroids.  BC neg - worsening leukocytosis but remains afebrile for last 24hrs - Monitor fever/WBC curve - follow bone marrow biopsy when obtained.  ID also following to help guide abx  Hypokalemia Hypernatremia, resolved  - K 4.2, Na 144 - cont FWF - trend BMET/ replete electrolytes as able  .  DM I - off insulin gtt - cont SSI, TF coverage, and levemir  At Risk Malnutrition  - on TF.  Holding pending respiratory status.  If stable will resume+  Prior RUE arterial occlusion 10/2021 s/p thrombectomy on Eliquis - no hx of DVT/ PE.  Concern for hypercoagulability given events on Eliquis.   - appreciate hematology input.  Recs to d/c on lovenox and f/u output with hematology . - remains on heparin gtt, therefore will send  anticardiolipin antibodies, lupus  anticoagulant and anti-beta2 abs, ANA, RA, ANCA.  ESR/ CRP will not be helpful given current osteomyelitis   Best Practice (right click and "Reselect all SmartList Selections" daily)  Diet/type: tubefeeds DVT prophylaxis: systemic heparin GI prophylaxis: PPI Lines: No longer needed.  Order written to d/c  Foley:  N/A Code Status:  full code Last date of multidisciplinary goals of care discussion: 9/11. Husband updated at bedside 9/15   Critical Care Time: 92 minutes     Kennieth Rad, MSN, AG-ACNP-BC [facility] 01/15/2022, 11:26 AM  See Amion for pager If no response to pager, please call PCCM consult pager After 7:00 pm call Elink

## 2022-01-15 NOTE — Progress Notes (Signed)
Inpatient Rehab Admissions Coordinator:    Continuing to follow this patient for potential CIR admission. At this time patient is not medically ready for discharge. Therefore not submitting information to insurance just yet. Will continue to follow for potential admission when bed available and patient medically stable.   Rehab Admissons Coordinator [place], [place], [place] [phone]

## 2022-01-15 NOTE — Progress Notes (Signed)
OT Cancellation Note  Patient Details Name: Lauren [name] MRN: [id number] DOB: 11/26/1980   Cancelled Treatment:    Reason Eval/Treat Not Completed: Medical issues which prohibited therapy (Recent intubation. RN state hold.)  [place], OTR/L Acute Rehab Office: [phone] 01/15/2022, 3:13 PM

## 2022-01-15 NOTE — Progress Notes (Signed)
eLink Physician-Brief Progress Note Patient Name: Lauren [name] DOB: [birth year] MRN: [id number]   Date of Service  01/15/2022  HPI/Events of Note  Hyperglycemia - Blood glucose < 180 for 4 hours. Currently on an insulin IV infusion.  eICU Interventions  Plan: Levemir 15 units [place] now and Q as hours. Q 4 hour sensitive Novolog SSI + Novolog 5 units [place] (tube feeding coverage). D/C Insulin IV infusion 2 hours after first Levemir dose.      Intervention Category Major Interventions: Hyperglycemia - active titration of insulin therapy  Gwendolen Hewlett [place] 01/15/2022, 5:06 AM

## 2022-01-15 NOTE — Progress Notes (Signed)
Pharmacy Antibiotic Note  Lauren [name] is a 41 y.o. female admitted on 01/08/2022 with right MCA stroke and vertebral artery stroke.  Pharmacy has been consulted for Vancomycin and ciprofloxacin dosing for osteomyelitis and HCAP.  Patient was previously on Augmentin PTA for concern of osteomyelitis. Overnight, patient experienced increased SOB and tachypnea with concern for development of HCAP. Antibiotics broadened to vancomycin and ciprofloxacin. ID is consulted and assisting in therapy plans.   Currently in AKI with eCrCl 30-40 mL/min. Scr trend continues to improve and UOP documented as 0.9 mL/kg/hr with intermittent lasix doses.   Plan: Start ciprofloxacin 200mg  IV Q12H Vancomycin 1750mg  x 1 dose (20 mg/kg) - future doses to be determined on renal function. Will require at least Q24H dosing, if not Q36H dosing pending renal function  Monitor renal function for dose adjustments Monitor culture data for de-escalation. Foot bone biopsy pending per podiatry.  Height: 5\' 5"  (165.1 cm) Weight: 88.6 kg (195 lb 5.2 oz) IBW/kg (Calculated) : 57  Temp (24hrs), Avg:99 F (37.2 C), Min:98.6 F (37 C), Max:99.3 F (37.4 C)  Recent Labs  Lab 01/09/22 0214 01/09/22 0503 01/11/22 0532 01/12/22 0203 01/13/22 0429 01/14/22 0556 01/14/22 1410 01/15/22 0714  WBC  --    < > 17.3* 19.1* 22.0* 22.6*  --  27.2*  CREATININE  --    < > 4.16* 3.58* 2.73* 2.47* 2.48* 2.19*  LATICACIDVEN 1.9  --   --   --   --   --   --   --    < > = values in this interval not displayed.    Estimated Creatinine Clearance: 37.9 mL/min (A) (by C-G formula based on SCr of 2.19 mg/dL (H)).    Allergies  Allergen Reactions   Wellbutrin [Bupropion] Hives   Cefepime Rash    Tolerates penicilllin   Ciprofloxacin Hcl Hives and Rash    Hives/rash at injection site    Tape Rash    Antimicrobials this admission: Augmentin 8/14 >> 9/15 Vanc 9/15 >> Cipro 9/15 >>  Dose adjustments this  admission: N/A  Microbiology results: 9/8 Resp PCR: +rhino/enterovirus 9/8 BCx: ngtd 9/9 TA: normal flora 9/15 Bcx: sent  Thank you for allowing pharmacy to be a part of this patient's care.  Merrilee Jansky, PharmD Clinical Pharmacist 01/15/2022 10:40 AM

## 2022-01-15 NOTE — Procedures (Signed)
Bedside Bronchoscopy Procedure Note Lauren [name] [id number] June 21, 1980  Procedure: Bronchoscopy Indications: Obtain specimens for culture and/or other diagnostic studies  Procedure Details: ET Tube Size: ET Tube secured at lip (cm): Bite block in place: Yes In preparation for procedure, Patient hyper-oxygenated with 100 % FiO2 Airway entered and the following bronchi were examined: Bronchi.   Bronchoscope removed.  , Patient placed back on 100% FiO2 at conclusion of procedure.    Evaluation BP 107/62   Pulse 80   Temp 100.1 F (37.8 C) (Axillary)   Resp (!) 27   Ht 5\' 5"  (1.651 m)   Wt 88.6 kg   SpO2 96%   BMI 32.50 kg/m  Breath Sounds:Clear and Diminished O2 sats: stable throughout Patient's Current Condition: stable Specimens:  Sent  Complications: No apparent complications Patient did tolerate procedure well.   Gonzella Lex 01/15/2022, 2:59 PM

## 2022-01-15 NOTE — Progress Notes (Signed)
[place] for heparin Indication: chest pain/ACS  Allergies  Allergen Reactions   Wellbutrin [Bupropion] Hives   Cefepime Rash    Tolerates penicilllin   Ciprofloxacin Hcl Hives and Rash    Hives/rash at injection site    Tape Rash    Patient Measurements: Height: 5\' 5"  (165.1 cm) Weight: 88.6 kg (195 lb 5.2 oz) IBW/kg (Calculated) : 57 Heparin Dosing Weight: 77kg  Vital Signs: Temp: 100.1 F (37.8 C) (09/15 1200) Temp Source: Axillary (09/15 1200) BP: 107/62 (09/15 1300) Pulse Rate: 80 (09/15 1300)  Labs: Recent Labs    01/13/22 0429 01/13/22 0827 01/13/22 1059 01/13/22 2054 01/14/22 0556 01/14/22 1410 01/14/22 2028 01/15/22 0714 01/15/22 1241  HGB 8.7*  --   --   --  7.7*  --   --  7.5*  --   HCT 26.3*  --   --   --  23.7*  --   --  23.2*  --   PLT 353  --   --   --  347  --   --  344  --   APTT  --    < > 48*   < > 63* 45* 96* 64* 49*  HEPARINUNFRC  --    < >  --   --  0.58  --   --  0.42 0.28*  CREATININE 2.73*  --   --   --  2.47* 2.48*  --  2.19*  --   TROPONINIHS  --   --  2,880*  --   --   --   --   --   --    < > = values in this interval not displayed.     Estimated Creatinine Clearance: 37.9 mL/min (A) (by C-G formula based on SCr of 2.19 mg/dL (H)).   Assessment: 55 yoF admitted with R MCA vertebral stroke s/p thrombectomy and stent placement. Pt is on apixaban prior to admit, no anticoagulation received this admission. Pt with elevated troponins and now chest pain, pharmacy asked to dose IV heparin for ACS management. Will defer bolus and utilize lower goal in setting of recent CVA.  While on heparin 1400 units/hr, morning heparin level 0.42 (therapeutic) and aPTT 64 (slightly subtherapeutic). Given trends, hard to tell if correlating. Repeat 6hr heparin level 0.28 (subtherapeutic) and aPTT 49 (subtherapeutic).   With most recent levels, would say they are correlating and can continue with only monitoring  heparin levels for dose adjustments.  Patient has been hard to maintain therapeutic levels and has been requiring drip rates from 1400-1600 units/hr.   Goal of Therapy:  Heparin level 0.3-0.5 units/ml Monitor platelets by anticoagulation protocol: Yes   Plan:  Increase heparin to 1500 units/hr Check 6hr heparin level at 2100 Daily CBC and heparin level, s/sx bleeding  Erskine Speed, PharmD Clinical Pharmacist 01/15/2022 1:50 PM

## 2022-01-15 NOTE — Procedures (Signed)
Intubation Procedure Note  Emery Binz  [id number]  1981/04/19  Date:01/15/22  Time:5:35 PM   Provider Performing:Michail Boyte Cipriano Mile supervising Sabino Dick MS4   Procedure: Intubation (70962)  Indication(s) Respiratory Failure  Consent Emergent   Anesthesia Etomidate and Succinylcholine   Time Out Verified patient identification, verified procedure, site/side was marked, verified correct patient position, special equipment/implants available, medications/allergies/relevant history reviewed, required imaging and test results available.   Sterile Technique Usual hand hygeine, masks, and gloves were used   Procedure Description Patient positioned in bed supine.  Sedation given as noted above.  Patient was intubated with endotracheal tube using Glidescope.  View was Grade 1 full glottis .  Number of attempts was 1.  Colorimetric CO2 detector was consistent with tracheal placement.   Complications/Tolerance None; patient tolerated the procedure well. Chest X-ray is ordered to verify placement.   EBL Minimal   Specimen(s) None

## 2022-01-15 NOTE — Progress Notes (Signed)
[name] for heparin Indication: chest pain/ACS  Allergies  Allergen Reactions   Wellbutrin [Bupropion] Hives   Cefepime Rash    Tolerates penicilllin   Ciprofloxacin Hcl Hives and Rash    Hives/rash at injection site    Tape Rash    Patient Measurements: Height: 5\' 5"  (165.1 cm) Weight: 88.6 kg (195 lb 5.2 oz) IBW/kg (Calculated) : 57 Heparin Dosing Weight: 77kg  Vital Signs: Temp: 100.1 F (37.8 C) (09/15 2000) Temp Source: Axillary (09/15 2000) BP: 99/85 (09/15 2200) Pulse Rate: 87 (09/15 2200)  Labs: Recent Labs    01/13/22 0429 01/13/22 0827 01/13/22 1059 01/13/22 2054 01/14/22 0556 01/14/22 1410 01/14/22 2028 01/15/22 0714 01/15/22 1241 01/15/22 1622 01/15/22 2217  HGB 8.7*  --   --   --  7.7*  --   --  7.5*  --  11.6*  --   HCT 26.3*  --   --   --  23.7*  --   --  23.2*  --  34.0*  --   PLT 353  --   --   --  347  --   --  344  --   --   --   APTT  --    < > 48*   < > 63* 45* 96* 64* 49*  --   --   HEPARINUNFRC  --    < >  --   --  0.58  --   --  0.42 0.28*  --  0.42  CREATININE 2.73*  --   --   --  2.47* 2.48*  --  2.19*  --   --   --   TROPONINIHS  --   --  2,880*  --   --   --   --   --   --   --   --    < > = values in this interval not displayed.     Estimated Creatinine Clearance: 37.9 mL/min (A) (by C-G formula based on SCr of 2.19 mg/dL (H)).   Assessment: Lauren [name] admitted with R MCA vertebral stroke s/p thrombectomy and stent placement. Pt is on apixaban prior to admit, no anticoagulation received this admission. Pt with elevated troponins and now chest pain, pharmacy asked to dose IV heparin for ACS management. Will defer bolus and utilize lower goal in setting of recent CVA.  Heparin 1500 units/hr, morning heparin level 0.42 (therapeutic).  Goal of Therapy:  Heparin level 0.3-0.5 units/ml Monitor platelets by anticoagulation protocol: Yes   Plan:  Continue heparin to 1500 units/hr Check heparin  level with am labs Daily CBC and heparin level, s/sx bleeding  Alanda Slim, PharmD, [facility] Clinical Pharmacist Please see AMION for all Pharmacists' Contact Phone Numbers 01/15/2022, 11:17 PM

## 2022-01-15 NOTE — Progress Notes (Addendum)
STROKE TEAM PROGRESS NOTE   INTERVAL HISTORY RN at bedside. Husband at bedside.  She had an MRI done yesterday which is significant motion artifact however appears to be unchanged.  Per nurse and her husband she was able to talk overnight and say few words or phrases.  She did write on her board yesterday that she can speak but does not want to.  She is still able to follow commands on the right side.  Currently she is worse in terms of respiratory status and on BiPAP later in the day she got intubated.  CCM is consulted infectious disease and podiatry and cardiology is following.  Initially those plans to transfer out of the ICU yesterday but got canceled as she got worse.  She is still on nitro drip and heparin drip for evaded troponins and coronary syndrome.    CBC:  Recent Labs  Lab 01/09/22 0503 01/10/22 0522 01/14/22 0556 01/15/22 0714  WBC 26.5*   < > 22.6* 27.2*  NEUTROABS 26.0*  --   --   --   HGB 9.0*   < > 7.7* 7.5*  HCT 25.8*   < > 23.7* 23.2*  MCV 89.0   < > 92.9 92.8  PLT 327   < > 347 344   < > = values in this interval not displayed.    Basic Metabolic Panel:  Recent Labs  Lab 01/12/22 1615 01/13/22 0429 01/14/22 0556 01/14/22 0653 01/14/22 1410 01/15/22 0714  NA  --  148*   < >  --  142 144  K  --  3.5   < >  --  5.2* 4.2  CL  --  111   < >  --  108 109  CO2  --  28   < >  --  23 25  GLUCOSE  --  81   < >  --  429* 124*  BUN  --  53*   < >  --  54* 53*  CREATININE  --  2.73*   < >  --  2.48* 2.19*  CALCIUM  --  8.5*   < >  --  7.7* 8.2*  MG 2.2 2.4  --  2.4  --  2.4  PHOS 4.2 3.7  --   --   --   --    < > = values in this interval not displayed.    Lipid Panel:  Recent Labs  Lab 01/09/22 0503  CHOL 85  TRIG 154*  156*  HDL 15*  CHOLHDL 5.7  VLDL 31  LDLCALC 39    HgbA1c:  Recent Labs  Lab 01/09/22 1425  HGBA1C 8.0*    Urine Drug Screen:  Recent Labs  Lab 01/09/22 0225  LABOPIA NONE DETECTED  COCAINSCRNUR NONE DETECTED   LABBENZ NONE DETECTED  AMPHETMU NONE DETECTED  THCU NONE DETECTED  LABBARB NONE DETECTED     Alcohol Level  No results for input(s): "ETH" in the last 168 hours.   IMAGING past 24 hours DG CHEST PORT 1 VIEW  Result Date: 01/15/2022 CLINICAL DATA:  Shortness of breath EXAM: PORTABLE CHEST 1 VIEW COMPARISON:  Chest radiograph 01/11/2022 FINDINGS: The enteric catheter courses into the stomach and off the field of view. The cardiomediastinal silhouette is grossly stable. There are new extensive opacities throughout both lungs. There is no significant pleural effusion. There is no pneumothorax There is no acute osseous abnormality. IMPRESSION: New extensive opacities throughout both lungs may reflect multifocal infection, pulmonary edema,  ARDS, or combination thereof. Electronically Signed   By: Valetta Mole M.D.   On: 01/15/2022 08:30   VAS [name] ABI WITH/WO TBI  Result Date: 01/14/2022  LOWER EXTREMITY DOPPLER STUDY Patient Name:  JOYLENE WESCOTT  Date of Exam:   01/14/2022 Medical Rec #: [id number]        Accession #:    [id number] Date of Birth: 03/19/1981        Patient Gender: F Patient Age:   41 years Exam Location:  [facility] Procedure:      VAS [name] ABI WITH/WO TBI Referring Phys: ADAM MCDONALD --------------------------------------------------------------------------------  Indications: Ulceration. Non-healing, left foot. Left great toe amputation              09-02-2021. High Risk Factors: Hypertension, hyperlipidemia, Diabetes, past history of                    smoking, prior MI, coronary artery disease, prior CVA.  Comparison Study: 08-23-2021 ABI w/ TBI showed RIGHT 0.83/0.66 and LEFT                   1.04/0.36; however, history of non-compressible LEFT ABI on                   02/12/2020. Performing Technologist: Darlin Coco RDMS RVT  Examination Guidelines: A complete evaluation includes at minimum, Doppler waveform signals and systolic blood pressure reading at the level of  bilateral brachial, anterior tibial, and posterior tibial arteries, when vessel segments are accessible. Bilateral testing is considered an integral part of a complete examination. Photoelectric Plethysmograph (PPG) waveforms and toe systolic pressure readings are included as required and additional duplex testing as needed. Limited examinations for reoccurring indications may be performed as noted.  ABI Findings: +---------+------------------+-----+---------+--------+ Right    Rt Pressure (mmHg)IndexWaveform Comment  +---------+------------------+-----+---------+--------+ Brachial 127                    triphasic         +---------+------------------+-----+---------+--------+ PTA      180               1.42 biphasic          +---------+------------------+-----+---------+--------+ DP       109               0.86 biphasic          +---------+------------------+-----+---------+--------+ Great Toe62                0.49 Abnormal          +---------+------------------+-----+---------+--------+ +---------+-----------------+-----+-------------------+------------------------+ Left     Lt Pressure      IndexWaveform           Comment                           (mmHg)                                                            +---------+-----------------+-----+-------------------+------------------------+ Brachial 122                                                               +---------+-----------------+-----+-------------------+------------------------+  PTA      103              0.81 dampened monophasicPreviously                                                                 non-compressible         +---------+-----------------+-----+-------------------+------------------------+ DP       106              0.83 monophasic         Previously                                                                 non-compressible          +---------+-----------------+-----+-------------------+------------------------+ Great Toe                                         Amputation               +---------+-----------------+-----+-------------------+------------------------+ +-------+-----------+-----------+------------+------------+ ABI/TBIToday's ABIToday's TBIPrevious ABIPrevious TBI +-------+-----------+-----------+------------+------------+ Right  1.42       0.49       0.83        0.66         +-------+-----------+-----------+------------+------------+ Left   0.83       amputation 1.04        0.36         +-------+-----------+-----------+------------+------------+  Right ABIs appear decreased compared to prior study on 08-23-2021. Left ABIs appear decreased compared to prior study on 08-23-2021.  Summary: Right: Resting right ankle-brachial index indicates noncompressible right lower extremity arteries. The right toe-brachial index is abnormal. Left: Resting left ankle-brachial index indicates noncompressible left lower extremity arteries. Although ankle-brachial indices are within mild (0.80-0.94) range, patient history of non-compressible arteries on previous exams coupled with abnormal, monophasic waveforms suggests progression of arterial disease. *See table(s) above for measurements and observations.  Electronically signed by Harold Barban MD on 01/14/2022 at 11:55:38 PM.    Final     PHYSICAL EXAM  Temp:  [98.9 F (37.2 C)-100.1 F (37.8 C)] 100.1 F (37.8 C) (09/15 1200) Pulse Rate:  [80-118] 80 (09/15 1300) Resp:  [22-47] 27 (09/15 1300) BP: (107-153)/(62-95) 107/62 (09/15 1300) SpO2:  [93 %-100 %] 96 % (09/15 1453) FiO2 (%):  [40 %-100 %] 100 % (09/15 1453) Weight:  [88.6 kg] 88.6 kg (09/15 0500)  General - Critically ill female. Well nourished, well developed, in no apparent distress.  Mental Status -  On BiPAP.  Keeps her eyes closed.  Follows commands on the right. She is able to write down  questions.  Right gaze preference, can briefly cross midline, difficult to assess her facial droop due to BiPAP.  PERRL and reactive. No blink to threat on left.   Motor Strength - Left arm is flaccid. Left leg withdraws to painful stimuli. Grimaces to noxious stimuli on left. Right side moves spontaneously and purposeful.  Will give thumbs up and show  2 fingers to command. Motor Tone - Muscle tone was assessed at the neck and appendages and was normal.   Sensory - appears to be intact to noxious stimuli in all 4 extremities.  Coordination - Unable to assess  Gait and Station - deferred.   ASSESSMENT/PLAN Ms. Leialoha Hanna is a 41 y.o. 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 NIHSS of 8. Patient not a TPA candidate, underwent IR thrombectomy of R ICA  with R ICA dissection repair and stent placement with TICI 3  Stroke:  Acute Right MCA and vertebral stroke s/p thrombectomy with TICI 3 with right ICA dissection repair and stent placement  Etiology:  unclear, likely cardioembolic  Code Stroke CT head 1. Periventricular white matter hypodensities adjacent to the frontal horn of the right lateral ventricles and in the right corona radiata are new since prior exam. This may represent acute ischemia. 2. No acute or focal cortical abnormality. 3. ASPECTS is 10/10 CTA head & neck with CT perfusion  1. 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 Right MCA and A1 ACA are patent, but  diminutive. 2. Small/non dominant right vertebral artery, which is irregular throughout its V2 segment with age indeterminate occlusion at the V3 segment with non opacification of the proximal intradural vertebral artery and reconstitution of the distal intradural vertebral artery. 3. Severe left paraclinoid ICA stenosis. 4. One CT perfusion, no evidence of core infarct or penumbra; however, there is approximately 38 mL of T-max greater than 4 seconds in the right MCA territory which suggests possible oligemia. An MRI could provide more sensitive evaluation for acute infarct. 5. Moderate layering bilateral pleural effusions and peribronchial wall thickening. Recommend dedicated chest imaging  Cerebral angio  Occluded right internal carotid artery extracranially and intracranially to the supraclinoid region Status post endovascular complete revascularization of occluded right internal carotid artery proximally and distally intracranially with 1 pass with a 4 mm x 40 mm solitary extragenital device and proximal aspiration, 1 pass with a 5 mm x 37 mm amber trap retrieval device and aspiration proximally, and 1 pass with a 6.5 mm x 45 mm ambulatory with proximal aspiration achieving a complete revascularization of the internal carotid artery.   Status post reconstruction of symptomatic occlusive long segment dissection extending from the proximal one third of the right internal carotid artery to the petrous cavernous junction using 3 pipeline flow diverter's and a 4 mm x 24 mm Neuroform Atlas stent in a  telescope fashion.  Final arteriogram from the right common carotid artery demonstrates complete revascularization without any filling defects with opacification of the right middle cerebral artery distribution maintaining aTICI 3 revascularization.  Post IR CT  1. Redemonstrated hypodensity in the right frontal periventricular white matter. No additional acute intracranial process. No additional acute  infarct or hemorrhage. 2. Air-fluid levels with bubbly fluid in the maxillary sinuses and right sphenoid sinus are likely related to intubation but can be seen in the setting of sinusitis  MRI   1. Extensive patchy areas of acute nonhemorrhagic  infarction involving the right MCA territory, most confluent areas along the watershed. 2. Focal nonhemorrhagic infarct involving the genu of the corpus callosum and more posteriorly along the body of the corpus callosum on the right. 3. Diffuse sinus disease is likely secondary to intubation MRA   1. Abnormal flow signal within the petrous and pre cavernous right ICA may represent artifact from stent or slow flow 2. Signal loss in the supraclinoid left ICA may be artifactual. 3. Asymmetric diffused decreased size of right MCA branch vessels compared to the left likely reflects decreased perfusion. No significant proximal stenosis or occlusion  Repeat MRI 01/14/2022 shows motion degradation however essentially unchanged MRI with no evidence of new stroke.  2D Echo EF 30-35%, akinesis of mid to apical septal LV segments, all apical segments and apex.  LDL 39 HgbA1c 9.6 VTE prophylaxis - SCD's    Diet   Diet NPO time specified   Eliquis (apixaban) daily prior to admission, now on aspirin 81 mg daily and Brilinta (ticagrelor) 90 mg bid.  She is also on heparin drip for coronary event. Therapy recommendations:  CIR Disposition:  pending  Hypertension Home meds:  hydralazine, metoprolol, isosorbide Stable Long-term BP goal normotensive  Hyperlipidemia Home meds:  fenofibrate, not resumed in hospital LDL 39, goal < 70 Started on atorvastatin. Continue at discharge   Diabetes type II UnControlled Metabolic Acidosis  DKA Home meds:  insulin HgbA1c 9.6, goal < 7.0 CBGs Recent Labs    01/15/22 1120 01/15/22 1156 01/15/22 1409  GLUCAP 78 118* 2*       Other Stroke Risk Factors  Former Cigarette smoker advised to stop  smoking Obesity, Body mass index is 32.5 kg/m., BMI >/= 30 associated with increased stroke risk, recommend weight loss, diet and exercise as appropriate  Hx stroke/TIA Coronary artery disease Congestive heart failure  Other Active Problems Acute hypoxic respiratory Failure, resolved  managed by CCM Off prop Extubated 9/10, reintubated today On bipap this morning then later got reintubated  Chronic Combined HF EF 30-35%, decreased LV function, bubble study negative Meds on hold currently  Hypothyroidism  Home synthroid   AKI on CKD  IVF  Worsening Cr 2.50-3.07-3.63-3.80-4.17-4.54-4.16-2.73 Renal following  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 On  aspirin and Brilinta.  She had expressive aphasia after extubation and repeat MRI was obtained yesterday but no new stroke was noted.  Her main issue is cardiac.  She has elevated troponins cardiology is following she is on a heparin drip as well as nitro drip.  She is now reintubated this afternoon and getting worse.  Cardiology and infectious disease are following  Appreciate CCM assistance     This patient is critically ill due to respiratory distress, possible sepsis/osteo, and  stroke s/p thrombectomy and at significant risk of neurological worsening, death form heart failure, respiratory failure, recurrent stroke, bleeding from [facility], seizure, sepsis. This patient's care requires constant monitoring of vital signs, hemodynamics, respiratory and cardiac  monitoring, review of multiple databases, neurological assessment, discussion  with family, other specialists and medical decision making of high complexity. I spent 35 minutes of neurocritical care time in the care of this patient.  Jazlyne Gauger,MD

## 2022-01-16 DIAGNOSIS — A419 Sepsis, unspecified organism: Secondary | ICD-10-CM | POA: Diagnosis not present

## 2022-01-16 DIAGNOSIS — D72829 Elevated white blood cell count, unspecified: Secondary | ICD-10-CM

## 2022-01-16 DIAGNOSIS — M86672 Other chronic osteomyelitis, left ankle and foot: Secondary | ICD-10-CM

## 2022-01-16 DIAGNOSIS — L97509 Non-pressure chronic ulcer of other part of unspecified foot with unspecified severity: Secondary | ICD-10-CM

## 2022-01-16 DIAGNOSIS — I6521 Occlusion and stenosis of right carotid artery: Secondary | ICD-10-CM | POA: Diagnosis not present

## 2022-01-16 DIAGNOSIS — R6521 Severe sepsis with septic shock: Secondary | ICD-10-CM

## 2022-01-16 DIAGNOSIS — I5020 Unspecified systolic (congestive) heart failure: Secondary | ICD-10-CM | POA: Diagnosis not present

## 2022-01-16 DIAGNOSIS — N179 Acute kidney failure, unspecified: Secondary | ICD-10-CM | POA: Diagnosis not present

## 2022-01-16 DIAGNOSIS — I7025 Atherosclerosis of native arteries of other extremities with ulceration: Secondary | ICD-10-CM

## 2022-01-16 DIAGNOSIS — J9601 Acute respiratory failure with hypoxia: Secondary | ICD-10-CM | POA: Diagnosis not present

## 2022-01-16 LAB — RENAL FUNCTION PANEL
Albumin: <1.5 g/dL — ABNORMAL LOW (ref 3.5–5.0)
Anion gap: 12 (ref 5–15)
BUN: 57 mg/dL — ABNORMAL HIGH (ref 6–20)
CO2: 26 mmol/L (ref 22–32)
Calcium: 7.9 mg/dL — ABNORMAL LOW (ref 8.9–10.3)
Chloride: 104 mmol/L (ref 98–111)
Creatinine, Ser: 2.44 mg/dL — ABNORMAL HIGH (ref 0.44–1.00)
GFR, Estimated: 25 mL/min — ABNORMAL LOW (ref >60)
Glucose, Bld: 124 mg/dL — ABNORMAL HIGH (ref 70–99)
Phosphorus: 4.6 mg/dL (ref 2.5–4.6)
Potassium: 3.6 mmol/L (ref 3.5–5.1)
Sodium: 142 mmol/L (ref 135–145)

## 2022-01-16 LAB — GLUCOSE, CAPILLARY
Glucose-Capillary: 122 mg/dL — ABNORMAL HIGH (ref 70–99)
Glucose-Capillary: 125 mg/dL — ABNORMAL HIGH (ref 70–99)
Glucose-Capillary: 184 mg/dL — ABNORMAL HIGH (ref 70–99)
Glucose-Capillary: 208 mg/dL — ABNORMAL HIGH (ref 70–99)
Glucose-Capillary: 312 mg/dL — ABNORMAL HIGH (ref 70–99)
Glucose-Capillary: 348 mg/dL — ABNORMAL HIGH (ref 70–99)
Glucose-Capillary: 39 mg/dL — CL (ref 70–99)

## 2022-01-16 LAB — HEPARIN LEVEL (UNFRACTIONATED): Heparin Unfractionated: 0.32 IU/mL (ref 0.30–0.70)

## 2022-01-16 LAB — CBC
HCT: 22 % — ABNORMAL LOW (ref 36.0–46.0)
Hemoglobin: 7 g/dL — ABNORMAL LOW (ref 12.0–15.0)
MCH: 30.3 pg (ref 26.0–34.0)
MCHC: 31.8 g/dL (ref 30.0–36.0)
MCV: 95.2 fL (ref 80.0–100.0)
Platelets: 309 10*3/uL (ref 150–400)
RBC: 2.31 MIL/uL — ABNORMAL LOW (ref 3.87–5.11)
RDW: 14.5 % (ref 11.5–15.5)
WBC: 18.8 10*3/uL — ABNORMAL HIGH (ref 4.0–10.5)
nRBC: 0 % (ref 0.0–0.2)

## 2022-01-16 LAB — BETA-2-GLYCOPROTEIN I ABS, IGG/M/A
Beta-2 Glyco I IgG: <9 GPI IgG units (ref 0–20)
Beta-2 Glyco I IgG: <9 GPI IgG units (ref 0–20)
Beta-2-Glycoprotein I IgA: <9 GPI IgA units (ref 0–25)
Beta-2-Glycoprotein I IgA: <9 GPI IgA units (ref 0–25)
Beta-2-Glycoprotein I IgM: <9 GPI IgM units (ref 0–32)
Beta-2-Glycoprotein I IgM: <9 GPI IgM units (ref 0–32)

## 2022-01-16 LAB — ANA W/REFLEX IF POSITIVE: Anti Nuclear Antibody (ANA): NEGATIVE

## 2022-01-16 LAB — TRIGLYCERIDES: Triglycerides: 197 mg/dL — ABNORMAL HIGH (ref <150)

## 2022-01-16 LAB — CARDIOLIPIN ANTIBODIES, IGG, IGM, IGA
Anticardiolipin IgA: <9 APL U/mL (ref 0–11)
Anticardiolipin IgG: <9 GPL U/mL (ref 0–14)
Anticardiolipin IgM: <9 MPL U/mL (ref 0–12)

## 2022-01-16 LAB — PTT-LA MIX: PTT-LA Mix: 46.2 s — ABNORMAL HIGH (ref 0.0–40.5)

## 2022-01-16 LAB — HEXAGONAL PHASE PHOSPHOLIPID: Hexagonal Phase Phospholipid: 3 s (ref 0–11)

## 2022-01-16 LAB — MAGNESIUM: Magnesium: 2.6 mg/dL — ABNORMAL HIGH (ref 1.7–2.4)

## 2022-01-16 LAB — APTT: aPTT: 66 seconds — ABNORMAL HIGH (ref 24–36)

## 2022-01-16 LAB — LUPUS ANTICOAGULANT PANEL
DRVVT: 42.2 s (ref 0.0–47.0)
PTT Lupus Anticoagulant: 47.6 s — ABNORMAL HIGH (ref 0.0–43.5)

## 2022-01-16 MED ORDER — FUROSEMIDE 10 MG/ML IJ SOLN
60.0000 mg | Freq: Two times a day (BID) | INTRAMUSCULAR | Status: DC
  Administered 2022-01-16 – 2022-01-17 (×2): 60 mg via INTRAVENOUS
  Filled 2022-01-16 (×2): qty 6

## 2022-01-16 MED ORDER — INSULIN ASPART 100 UNIT/ML IJ SOLN
0.0000 [IU] | INTRAMUSCULAR | Status: DC
  Administered 2022-01-16 (×2): 15 [IU] via SUBCUTANEOUS
  Administered 2022-01-17: 3 [IU] via SUBCUTANEOUS
  Administered 2022-01-17: 15 [IU] via SUBCUTANEOUS
  Administered 2022-01-17: 7 [IU] via SUBCUTANEOUS
  Administered 2022-01-17: 20 [IU] via SUBCUTANEOUS
  Administered 2022-01-17: 15 [IU] via SUBCUTANEOUS
  Administered 2022-01-17: 4 [IU] via SUBCUTANEOUS
  Administered 2022-01-18: 3 [IU] via SUBCUTANEOUS
  Administered 2022-01-18: 7 [IU] via SUBCUTANEOUS
  Administered 2022-01-18: 3 [IU] via SUBCUTANEOUS
  Administered 2022-01-18: 7 [IU] via SUBCUTANEOUS
  Administered 2022-01-18: 4 [IU] via SUBCUTANEOUS
  Administered 2022-01-18 – 2022-01-19 (×2): 7 [IU] via SUBCUTANEOUS
  Administered 2022-01-19: 4 [IU] via SUBCUTANEOUS
  Administered 2022-01-19: 11 [IU] via SUBCUTANEOUS
  Administered 2022-01-19: 3 [IU] via SUBCUTANEOUS
  Administered 2022-01-19 (×2): 4 [IU] via SUBCUTANEOUS
  Administered 2022-01-20: 7 [IU] via SUBCUTANEOUS
  Administered 2022-01-20 (×2): 4 [IU] via SUBCUTANEOUS
  Administered 2022-01-20: 3 [IU] via SUBCUTANEOUS
  Administered 2022-01-20: 15 [IU] via SUBCUTANEOUS
  Administered 2022-01-20: 4 [IU] via SUBCUTANEOUS
  Administered 2022-01-21: 7 [IU] via SUBCUTANEOUS
  Administered 2022-01-21: 11 [IU] via SUBCUTANEOUS
  Administered 2022-01-21: 15 [IU] via SUBCUTANEOUS
  Administered 2022-01-21 (×2): 7 [IU] via SUBCUTANEOUS
  Administered 2022-01-21 – 2022-01-22 (×4): 4 [IU] via SUBCUTANEOUS
  Administered 2022-01-22: 11 [IU] via SUBCUTANEOUS
  Administered 2022-01-22 – 2022-01-23 (×3): 4 [IU] via SUBCUTANEOUS
  Administered 2022-01-23 (×3): 3 [IU] via SUBCUTANEOUS
  Administered 2022-01-23 (×2): 4 [IU] via SUBCUTANEOUS
  Administered 2022-01-24 (×4): 3 [IU] via SUBCUTANEOUS
  Administered 2022-01-25: 11 [IU] via SUBCUTANEOUS
  Administered 2022-01-25: 7 [IU] via SUBCUTANEOUS
  Administered 2022-01-25: 3 [IU] via SUBCUTANEOUS
  Administered 2022-01-25: 11 [IU] via SUBCUTANEOUS
  Administered 2022-01-25: 7 [IU] via SUBCUTANEOUS
  Administered 2022-01-26 (×2): 3 [IU] via SUBCUTANEOUS
  Administered 2022-01-26: 4 [IU] via SUBCUTANEOUS
  Administered 2022-01-26 (×2): 11 [IU] via SUBCUTANEOUS
  Administered 2022-01-27: 1 [IU] via SUBCUTANEOUS
  Administered 2022-01-27: 4 [IU] via SUBCUTANEOUS
  Administered 2022-01-27: 7 [IU] via SUBCUTANEOUS
  Administered 2022-01-27: 3 [IU] via SUBCUTANEOUS
  Administered 2022-01-27: 4 [IU] via SUBCUTANEOUS
  Administered 2022-01-27: 3 [IU] via SUBCUTANEOUS
  Administered 2022-01-28 (×2): 7 [IU] via SUBCUTANEOUS
  Administered 2022-01-28: 3 [IU] via SUBCUTANEOUS

## 2022-01-16 MED ORDER — LIDOCAINE-EPINEPHRINE (PF) 2 %-1:200000 IJ SOLN
2.0000 mL | Freq: Once | INTRAMUSCULAR | Status: AC
  Administered 2022-01-16: 2 mL
  Filled 2022-01-16: qty 20

## 2022-01-16 MED ORDER — VANCOMYCIN HCL 500 MG/100ML IV SOLN
500.0000 mg | INTRAVENOUS | Status: DC
  Administered 2022-01-16 – 2022-01-18 (×3): 500 mg via INTRAVENOUS
  Filled 2022-01-16 (×3): qty 100

## 2022-01-16 MED ORDER — INSULIN DETEMIR 100 UNIT/ML ~~LOC~~ SOLN
8.0000 [IU] | Freq: Two times a day (BID) | SUBCUTANEOUS | Status: DC
  Administered 2022-01-16: 8 [IU] via SUBCUTANEOUS
  Filled 2022-01-16 (×3): qty 0.08

## 2022-01-16 MED ORDER — CIPROFLOXACIN IN D5W 400 MG/200ML IV SOLN
400.0000 mg | INTRAVENOUS | Status: DC
  Administered 2022-01-16 – 2022-01-17 (×2): 400 mg via INTRAVENOUS
  Filled 2022-01-16 (×3): qty 200

## 2022-01-16 NOTE — Progress Notes (Incomplete)
STROKE TEAM PROGRESS NOTE   INTERVAL HISTORY No family at bedside.  Patient still intubated however open eyes on voice, able to follow some simple commands.  Still right gaze preference and left hemiplegia.  Status post bone biopsy this morning.  CBC:  Recent Labs  Lab 01/15/22 0714 01/15/22 1622 01/16/22 0403  WBC 27.2*  --  18.8*  HGB 7.5* 11.6* 7.0*  HCT 23.2* 34.0* 22.0*  MCV 92.8  --  95.2  PLT 344  --  315   Basic Metabolic Panel:  Recent Labs  Lab 01/13/22 0429 01/14/22 0556 01/15/22 0714 01/15/22 1622 01/16/22 0403  NA 148*   < > 144 141 142  K 3.5   < > 4.2 4.0 3.6  CL 111   < > 109  --  104  CO2 28   < > 25  --  26  GLUCOSE 81   < > 124*  --  124*  BUN 53*   < > 53*  --  57*  CREATININE 2.73*   < > 2.19*  --  2.44*  CALCIUM 8.5*   < > 8.2*  --  7.9*  MG 2.4   < > 2.4  --  2.6*  PHOS 3.7  --   --   --  4.6   < > = values in this interval not displayed.   Lipid Panel:  Recent Labs  Lab 01/16/22 0403  TRIG 197*   HgbA1c:  Recent Labs  Lab 01/09/22 1425  HGBA1C 8.0*   Urine Drug Screen:  No results for input(s): "LABOPIA", "COCAINSCRNUR", "LABBENZ", "AMPHETMU", "THCU", "LABBARB" in the last 168 hours.  Alcohol Level  No results for input(s): "ETH" in the last 168 hours.   IMAGING past 24 hours DG CHEST PORT 1 VIEW  Result Date: 01/15/2022 CLINICAL DATA:  Stroke, history of CHF. EXAM: PORTABLE CHEST 1 VIEW COMPARISON:  Chest radiograph earlier the same day FINDINGS: The enteric catheter tip courses into the stomach and off the field of view. The left IJ vascular catheter terminates in the expected location of the right atrium. The endotracheal tube tip is approximately 3.2 cm from the carina. Patchy opacities are again seen in both lungs, right worse than left, though overall improved since the study from earlier the same day. There is no pleural effusion or pneumothorax. The bones are stable. IMPRESSION: Improved aeration of both lungs since the study  from earlier the same day. Electronically Signed   By: Valetta Mole M.D.   On: 01/15/2022 15:58    PHYSICAL EXAM  Temp:  [98.4 F (36.9 C)-100.1 F (37.8 C)] 99 F (37.2 C) (09/16 0800) Pulse Rate:  [67-116] 68 (09/16 1102) Resp:  [13-42] 28 (09/16 1102) BP: (77-190)/(48-128) 110/67 (09/16 1102) SpO2:  [90 %-100 %] 100 % (09/16 1102) FiO2 (%):  [40 %-100 %] 40 % (09/16 1102) Weight:  [86.7 kg] 86.7 kg (09/16 0500)  General - Well nourished, well developed, intubated on sedation.  Ophthalmologic - fundi not visualized due to noncooperation.  Cardiovascular - Regular rhythm and rate.  Neuro - intubated on propofol, drowsy sleepy, eyes briefly open on voice but can not maintain opening, able to follow simple central and peripheral commands. With eye opening, eyes in position, more right gaze preference but still not have complete right gaze, not cross midline, blinking to visual threat on the right but not on the left, PERRL. Corneal reflex present, gag and cough present. Breathing over the vent.  Facial symmetry not  able to test due to ET tube.  Tongue protrusion not cooperative. On pain stimulation, RUE against gravity at least 4/5. RLE 3/5 on pain. LLE slight movement on pain but no movement of LUE. Sensation, coordination not cooperative and gait not tested.     ASSESSMENT/PLAN [name] is a 41 y.o. 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 NIHSS of 8. Patient not a TPA candidate, underwent IR thrombectomy of R ICA  with R ICA dissection repair and stent  placement with TICI 3  Stroke:  Acute Right MCA patchy infarcts and punctate left MCA/ACA infarct with right ICA occlusion s/p thrombectomy with TICI 3 with right ICA dissection repair and stent placement  Etiology:  unclear, likely cardioembolic  Code Stroke CT head 1. Periventricular white matter hypodensities adjacent to the frontal horn of the right lateral ventricles and in the right corona radiata are new since prior exam. This may represent acute ischemia. 2. No acute or focal cortical abnormality. 3. ASPECTS is 10/10 CTA head & neck with CT perfusion  1. 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 Right MCA and A1 ACA are patent, but diminutive. 2. Small/non dominant right vertebral artery, which is irregular throughout its V2 segment with age indeterminate occlusion at the V3 segment with non opacification of the proximal intradural vertebral artery and reconstitution of the distal intradural vertebral artery. 3. Severe left paraclinoid ICA stenosis. 4. One CT perfusion, no evidence of core infarct or penumbra; however, there is approximately 38 mL of T-max greater than 4 seconds in the right MCA territory which suggests possible oligemia. An MRI could provide more sensitive evaluation for acute infarct. 5. Moderate layering bilateral pleural effusions and peribronchial wall thickening. Recommend dedicated chest imaging  Cerebral angio  Occluded right internal carotid artery extracranially and intracranially to the supraclinoid region Status post endovascular complete revascularization of occluded right internal carotid artery proximally and distally intracranially with 1 pass with a 4 mm x 40 mm solitary extragenital device and proximal aspiration, 1 pass with a 5 mm x 37 mm amber trap retrieval device and aspiration proximally, and 1 pass with a 6.5 mm x 45 mm ambulatory with proximal  aspiration achieving a complete revascularization of the internal carotid artery.   Status post reconstruction of symptomatic occlusive long segment dissection extending from the proximal one third of the right internal carotid artery to the petrous cavernous junction using 3 pipeline flow diverter's and a 4 mm x 24 mm Neuroform Atlas stent in a  telescope fashion.  Final arteriogram from the right common carotid artery demonstrates complete revascularization without any filling defects with opacification of the right middle cerebral artery distribution maintaining aTICI 3 revascularization.  Post IR CT  1. Redemonstrated hypodensity in the right frontal periventricular white matter. No additional acute intracranial process. No additional acute infarct or hemorrhage. 2. Air-fluid levels with bubbly fluid in the maxillary sinuses and right sphenoid sinus are likely related to intubation but can be seen in the setting of sinusitis  MRI  1. Extensive patchy areas of acute nonhemorrhagic infarction involving the right MCA territory, most confluent areas along the watershed. 2. Focal nonhemorrhagic infarct involving the genu of the corpus callosum and more posteriorly along the body of the corpus callosum on the right. 3. Diffuse sinus disease is likely secondary to intubation MRA   1. Abnormal flow signal within the petrous and pre cavernous right ICA may represent artifact from stent or slow flow 2. Signal loss in the supraclinoid left ICA may be artifactual. 3. Asymmetric diffused decreased size of right MCA branch vessels compared to the left likely reflects decreased perfusion. No significant proximal stenosis or occlusion  Repeat MRI 01/14/2022 shows motion degradation however essentially unchanged MRI with no evidence of new stroke.  2D Echo EF 30-35%, akinesis of mid to apical septal LV segments, all apical segments and apex.  LDL 39 HgbA1c 9.6 VTE prophylaxis - SCD's Eliquis  (apixaban) daily prior to admission, now on aspirin 81 mg daily and Brilinta (ticagrelor) 90 mg bid.  She is also on heparin drip for coronary event. Therapy recommendations:  CIR Disposition:  pending  Hypertension Home meds:  hydralazine, metoprolol, isosorbide Stable Long-term BP goal normotensive  Hyperlipidemia Home meds:  fenofibrate, not resumed in hospital LDL 39, goal < 70 Started on atorvastatin. Continue at discharge   Diabetes type II UnControlled Metabolic Acidosis  DKA Home meds:  insulin HgbA1c 9.6, goal < 7.0 CBGs   Other Stroke Risk Factors  Former Cigarette smoker advised to stop smoking Obesity, Body mass index is 31.81 kg/m., BMI >/= 30 associated with increased stroke risk, recommend weight loss, diet and exercise as appropriate  Hx stroke/TIA Coronary artery disease Congestive heart failure  Other Active Problems Acute hypoxic respiratory Failure, resolved  managed by CCM Off prop Extubated 9/10, reintubated today On bipap this morning then later got reintubated  Chronic Combined HF EF 30-35%, decreased LV function, bubble study negative Meds on hold currently  Hypothyroidism  Home synthroid   AKI on CKD  IVF  Worsening Cr 2.50-3.07-3.63-3.80-4.17-4.54-4.16-2.73 Renal following  Osteomyelitis  left foot from diabetic ulcer  On augmentin at home chronically and plan was to continue according the podiatry note Following with podiatry   Anemia  Hgb 8.2, transfused  PRBC 9/10  Leukocytosis Improving 26.5-19.4-17.3-22.0 On ABX as above   Dysphagia  ST eval NG in place  Elevated Troponins  On heparin drip Nitro gtt off  Cards following   Hospital day # 8

## 2022-01-16 NOTE — Op Note (Signed)
   OPERATIVE REPORT Patient name: Lauren [name] MRN: [id number] DOB: 1981-02-07  DOS: 01/16/22  Preop Dx: Osteomyelitis fourth metatarsal LT foot Postop Dx: same  Procedure:  1.  Bone biopsy fourth metatarsal left foot sent for culture and pathology performed at bedside  Surgeon: Edrick Kins DPM  Hemostasis: None  EBL: 5 mL Materials: None Injectables: None Pathology: Bone biopsy fourth metatarsal sent for culture and pathology  Condition: The patient tolerated the procedure and anesthesia well.  The Jamshidi bone biopsy needle did extend intraoperatively beyond the far cortex of the fourth metatarsal protruding through the plantar aspect of the foot which left a small perforating laceration to the plantar aspect of the foot.  No suture was required.   Justification for procedure: 41 year old female in ICU, intubated, with diagnosed osteomyelitis of the fourth metatarsal.  Originally planned for bone biopsy yesterday at bedside however the patient was under respiratory distress and intubated so the procedure was delayed until today.  Verbal consent was obtained by the nurse from her husband, [name], via telephone.   Procedure in Detail: This was performed at bedside in the patient's ICU room.  The foot was prepped in an aseptic manner and 5 mL of 2% lidocaine w/ epi was infiltrated in a local block fashion around the fourth metatarsal.  A Jamshidi bone biopsy trephine was utilized to harvest 2 samples of bone from the distal portion of the fourth metatarsal.  The bone was very hard and during one of the harvesting of the core samples while pressure was being applied the Jamshidi needle extended beyond the far cortex of the fourth metatarsal and penetrated through the plantar foot. Overall to bone samples were harvested and placed in 2 separate containers.  One for culture and another for pathology.  Dry sterile compressive dressings were then applied. ICU nurse took the bone  biopsies to the pathology department.   Edrick Kins, DPM [facility]  Dr. Edrick Kins, DPM    [address], [place] [address]                Office [phone]  Fax [phone]

## 2022-01-16 NOTE — Progress Notes (Signed)
eLink Physician-Brief Progress Note Patient Name: Davene Jobin DOB: [birth year] MRN: [id number]   Date of Service  01/16/2022  HPI/Events of Note  Hyperglycemia - Levemir dose accidentally D/C on day shift. Blood glucose = 348.  eICU Interventions  Plan: Will reorder Levemir 8 units [place] BID.     Intervention Category Major Interventions: Hyperglycemia - active titration of insulin therapy  Lysle Dingwall 01/16/2022, 9:51 PM

## 2022-01-16 NOTE — Progress Notes (Signed)
Progress Note  Patient Name: Lauren [name] Date of Encounter: 01/16/2022  Primary Cardiologist: Carlyle Dolly, MD  Subjective   Sedated on ventilator.  Inpatient Medications    Scheduled Meds:  arformoterol  15 mcg Nebulization BID   aspirin  81 mg Oral Daily   Or   aspirin  81 mg Per Tube Daily   atorvastatin  40 mg Per Tube Daily   budesonide (PULMICORT) nebulizer solution  0.5 mg Nebulization BID   Chlorhexidine Gluconate Cloth  6 each Topical Daily   docusate  100 mg Per Tube BID   feeding supplement (PROSource TF20)  60 mL Per Tube Daily   free water  300 mL Per Tube Q4H   insulin aspart  1-3 Units Subcutaneous Q4H   insulin aspart  5 Units Subcutaneous Q4H   insulin detemir  8 Units Subcutaneous Q12H   isosorbide mononitrate  20 mg Per Tube BID   levothyroxine  75 mcg Per Tube Q0600   methylPREDNISolone (SOLU-MEDROL) injection  40 mg Intravenous Daily   metoprolol tartrate  25 mg Per Tube BID   nicotine  21 mg Transdermal Daily   mouth rinse  15 mL Mouth Rinse Q2H   pantoprazole  40 mg Per Tube Daily   polyethylene glycol  17 g Per Tube Daily   revefenacin  175 mcg Nebulization Daily   ticagrelor  90 mg Oral BID   Or   ticagrelor  90 mg Per Tube BID   Continuous Infusions:  ciprofloxacin     dexmedetomidine (PRECEDEX) IV infusion Stopped (01/15/22 1408)   dextrose     feeding supplement (GLUCERNA 1.5 CAL) 55 mL/hr at 01/16/22 1100   fentaNYL infusion INTRAVENOUS Stopped (01/16/22 0642)   heparin 1,500 Units/hr (01/16/22 1100)   norepinephrine (LEVOPHED) Adult infusion Stopped (01/16/22 0026)   propofol (DIPRIVAN) infusion 10 mcg/kg/min (01/16/22 1100)   vancomycin     PRN Meds: acetaminophen **OR** acetaminophen (TYLENOL) oral liquid 160 mg/5 mL **OR** acetaminophen, dextrose, docusate sodium, fentaNYL, influenza vac split quadrivalent PF, ipratropium-albuterol, labetalol, mouth rinse, pneumococcal 20-valent conjugate vaccine, polyethylene glycol,  senna-docusate   Vital Signs    Vitals:   01/16/22 0900 01/16/22 1000 01/16/22 1100 01/16/22 1102  BP: 118/74 110/67 110/67 110/67  Pulse: 70 70 67 68  Resp: (!) 28 (!) 28 (!) 28 (!) 28  Temp:      TempSrc:      SpO2: 100% 100% 100% 100%  Weight:      Height:        Intake/Output Summary (Last 24 hours) at 01/16/2022 1106 Last data filed at 01/16/2022 1100 Gross per 24 hour  Intake 2579.38 ml  Output 1850 ml  Net 729.38 ml   Filed Weights   01/14/22 0500 01/15/22 0500 01/16/22 0500  Weight: 89.4 kg 88.6 kg 86.7 kg    Telemetry    Sinus rhythm, brief burst of SVT.  Personally reviewed.  ECG    An ECG dated 01/15/2022 was personally reviewed today and demonstrated:  Sinus rhythm with diffuse ST-T wave abnormalities, rule out anterior infarct pattern.  Physical Exam   GEN: Sedated on ventilator.   Neck: No JVD. Cardiac: RRR, no murmur or gallop.  Respiratory: Nonlabored. Clear to auscultation bilaterally. GI: Nontender, bowel sounds present. MS: Edema upper and lower extremities. Neuro: Sedated.  Labs    Chemistry Recent Labs  Lab 01/14/22 1410 01/15/22 0714 01/15/22 1622 01/16/22 0403  NA 142 144 141 142  K 5.2* 4.2 4.0 3.6  CL 108  109  --  104  CO2 23 25  --  26  GLUCOSE 429* 124*  --  124*  BUN 54* 53*  --  57*  CREATININE 2.48* 2.19*  --  2.44*  CALCIUM 7.7* 8.2*  --  7.9*  ALBUMIN  --   --   --  <1.5*  GFRNONAA 25* 29*  --  25*  ANIONGAP 11 10  --  12     Hematology Recent Labs  Lab 01/14/22 0556 01/15/22 0714 01/15/22 1622 01/16/22 0403  WBC 22.6* 27.2*  --  18.8*  RBC 2.55* 2.50*  --  2.31*  HGB 7.7* 7.5* 11.6* 7.0*  HCT 23.7* 23.2* 34.0* 22.0*  MCV 92.9 92.8  --  95.2  MCH 30.2 30.0  --  30.3  MCHC 32.5 32.3  --  31.8  RDW 14.7 14.3  --  14.5  PLT 347 344  --  309    Cardiac Enzymes Recent Labs  Lab 01/08/22 1920 01/11/22 1001 01/12/22 0027 01/12/22 0203 01/13/22 1059  TROPONINIHS 860* 3,869* 3,807* 3,512* 2,880*      Radiology    DG CHEST PORT 1 VIEW  Result Date: 01/15/2022 CLINICAL DATA:  Stroke, history of CHF. EXAM: PORTABLE CHEST 1 VIEW COMPARISON:  Chest radiograph earlier the same day FINDINGS: The enteric catheter tip courses into the stomach and off the field of view. The left IJ vascular catheter terminates in the expected location of the right atrium. The endotracheal tube tip is approximately 3.2 cm from the carina. Patchy opacities are again seen in both lungs, right worse than left, though overall improved since the study from earlier the same day. There is no pleural effusion or pneumothorax. The bones are stable. IMPRESSION: Improved aeration of both lungs since the study from earlier the same day. Electronically Signed   By: Valetta Mole M.D.   On: 01/15/2022 15:58   DG CHEST PORT 1 VIEW  Result Date: 01/15/2022 CLINICAL DATA:  Shortness of breath EXAM: PORTABLE CHEST 1 VIEW COMPARISON:  Chest radiograph 01/11/2022 FINDINGS: The enteric catheter courses into the stomach and off the field of view. The cardiomediastinal silhouette is grossly stable. There are new extensive opacities throughout both lungs. There is no significant pleural effusion. There is no pneumothorax There is no acute osseous abnormality. IMPRESSION: New extensive opacities throughout both lungs may reflect multifocal infection, pulmonary edema, ARDS, or combination thereof. Electronically Signed   By: Valetta Mole M.D.   On: 01/15/2022 08:30   VAS [name] ABI WITH/WO TBI  Result Date: 01/14/2022  LOWER EXTREMITY DOPPLER STUDY Patient Name:  Lauren [name]  Date of Exam:   01/14/2022 Medical Rec #: [id number]        Accession #:    [id number] Date of Birth: [birth year]        Patient Gender: F Patient Age:   41 years Exam Location:  [facility] Procedure:      VAS [name] ABI WITH/WO TBI Referring Phys: ADAM MCDONALD --------------------------------------------------------------------------------  Indications: Ulceration.  Non-healing, left foot. Left great toe amputation              09-02-2021. High Risk Factors: Hypertension, hyperlipidemia, Diabetes, past history of                    smoking, prior MI, coronary artery disease, prior CVA.  Comparison Study: 08-23-2021 ABI w/ TBI showed RIGHT 0.83/0.66 and LEFT  1.04/0.36; however, history of non-compressible LEFT ABI on                   02/12/2020. Performing Technologist: Darlin Coco RDMS RVT  Examination Guidelines: A complete evaluation includes at minimum, Doppler waveform signals and systolic blood pressure reading at the level of bilateral brachial, anterior tibial, and posterior tibial arteries, when vessel segments are accessible. Bilateral testing is considered an integral part of a complete examination. Photoelectric Plethysmograph (PPG) waveforms and toe systolic pressure readings are included as required and additional duplex testing as needed. Limited examinations for reoccurring indications may be performed as noted.  ABI Findings: +---------+------------------+-----+---------+--------+ Right    Rt Pressure (mmHg)IndexWaveform Comment  +---------+------------------+-----+---------+--------+ Brachial 127                    triphasic         +---------+------------------+-----+---------+--------+ PTA      180               1.42 biphasic          +---------+------------------+-----+---------+--------+ DP       109               0.86 biphasic          +---------+------------------+-----+---------+--------+ Great Toe62                0.49 Abnormal          +---------+------------------+-----+---------+--------+ +---------+-----------------+-----+-------------------+------------------------+ Left     Lt Pressure      IndexWaveform           Comment                           (mmHg)                                                             +---------+-----------------+-----+-------------------+------------------------+ Brachial 122                                                               +---------+-----------------+-----+-------------------+------------------------+ PTA      103              0.81 dampened monophasicPreviously                                                                 non-compressible         +---------+-----------------+-----+-------------------+------------------------+ DP       106              0.83 monophasic         Previously  non-compressible         +---------+-----------------+-----+-------------------+------------------------+ Great Toe                                         Amputation               +---------+-----------------+-----+-------------------+------------------------+ +-------+-----------+-----------+------------+------------+ ABI/TBIToday's ABIToday's TBIPrevious ABIPrevious TBI +-------+-----------+-----------+------------+------------+ Right  1.42       0.49       0.83        0.66         +-------+-----------+-----------+------------+------------+ Left   0.83       amputation 1.04        0.36         +-------+-----------+-----------+------------+------------+  Right ABIs appear decreased compared to prior study on 08-23-2021. Left ABIs appear decreased compared to prior study on 08-23-2021.  Summary: Right: Resting right ankle-brachial index indicates noncompressible right lower extremity arteries. The right toe-brachial index is abnormal. Left: Resting left ankle-brachial index indicates noncompressible left lower extremity arteries. Although ankle-brachial indices are within mild (0.80-0.94) range, patient history of non-compressible arteries on previous exams coupled with abnormal, monophasic waveforms suggests progression of arterial disease. *See table(s) above for measurements  and observations.  Electronically signed by Harold Barban MD on 01/14/2022 at 11:55:38 PM.    Final    ECHOCARDIOGRAM COMPLETE  Result Date: 01/14/2022    ECHOCARDIOGRAM REPORT   Patient Name:   TEMPESTT SILBA Date of Exam: 01/14/2022 Medical Rec #:  [id number]       Height:       65.0 in Accession #:    [id number]      Weight:       197.1 lb Date of Birth:  22-Jun-1980       BSA:          1.966 m Patient Age:    59 years        BP:           139/76 mmHg Patient Gender: F               HR:           87 bpm. Exam Location:  Inpatient Procedure: 2D Echo, Color Doppler, Cardiac Doppler and Intracardiac            Opacification Agent Indications:    Dilated Cardiomyopathy  History:        Patient has prior history of Echocardiogram examinations, most                 recent 01/10/2022. CHF, CAD, Stroke; Risk Factors:Diabetes.  Sonographer:    Memory Argue Referring Phys: [id number] [place]  1. Left ventricular ejection fraction, by estimation, is 35 to 40%. Left ventricular ejection fraction by 2D MOD biplane is 37.1 %. The left ventricle has moderately decreased function. The left ventricle demonstrates regional wall motion abnormalities (see scoring diagram/findings for description). There is mild left ventricular hypertrophy. Left ventricular diastolic parameters are consistent with Grade II diastolic dysfunction (pseudonormalization). Elevated left ventricular end-diastolic pressure. The E/e' is 51. Wall motion abnormality suggestive of LAD territory infarct or less likely Takatsubo cardiomyopathy.  2. Right ventricular systolic function is normal. The right ventricular size is normal. Tricuspid regurgitation signal is inadequate for assessing PA pressure.  3. The mitral valve is abnormal. Trivial mitral valve regurgitation.  4. The inferior vena cava is normal in size with <50% respiratory  variability, suggesting right atrial pressure of 8 mmHg.  5. The aortic valve is tricuspid. Aortic valve  regurgitation is not visualized. Comparison(s): Changes from prior study are noted. 01/10/2022: LVEF 30-35%, mid to apical ventricular wall thinning and severe hypokinesis to akinesis. FINDINGS  Left Ventricle: No mural thrombus with Definity contrast. Left ventricular ejection fraction, by estimation, is 35 to 40%. Left ventricular ejection fraction by 2D MOD biplane is 37.1 %. The left ventricle has moderately decreased function. The left ventricle demonstrates regional wall motion abnormalities. Definity contrast agent was given IV to delineate the left ventricular endocardial borders. The left ventricular internal cavity size was normal in size. There is mild left ventricular hypertrophy. Left ventricular diastolic parameters are consistent with Grade II diastolic dysfunction (pseudonormalization). Elevated left ventricular end-diastolic pressure. The E/e' is 88.  LV Wall Scoring: The mid and distal anterior wall, mid and distal lateral wall, mid and distal anterior septum, entire apex, mid and distal inferior wall, mid anterolateral segment, and mid inferoseptal segment are hypokinetic. The basal anteroseptal segment, basal inferolateral segment, basal anterolateral segment, basal anterior segment, basal inferior segment, and basal inferoseptal segment are normal. Right Ventricle: The right ventricular size is normal. No increase in right ventricular wall thickness. Right ventricular systolic function is normal. Tricuspid regurgitation signal is inadequate for assessing PA pressure. Left Atrium: Left atrial size was normal in size. Right Atrium: Right atrial size was normal in size. Pericardium: Trivial pericardial effusion is present. The pericardial effusion is posterior to the left ventricle. Mitral Valve: The mitral valve is abnormal. There is mild thickening of the posterior and anterior mitral valve leaflet(s). Trivial mitral valve regurgitation. Tricuspid Valve: The tricuspid valve is grossly normal.  Tricuspid valve regurgitation is trivial. Aortic Valve: The aortic valve is tricuspid. Aortic valve regurgitation is not visualized. Aortic valve mean gradient measures 3.0 mmHg. Aortic valve peak gradient measures 6.2 mmHg. Aortic valve area, by VTI measures 2.65 cm. Pulmonic Valve: The pulmonic valve was normal in structure. Pulmonic valve regurgitation is not visualized. Aorta: The aortic root and ascending aorta are structurally normal, with no evidence of dilitation. Venous: The inferior vena cava is normal in size with less than 50% respiratory variability, suggesting right atrial pressure of 8 mmHg. IAS/Shunts: No atrial level shunt detected by color flow Doppler.  LEFT VENTRICLE PLAX 2D                        Biplane EF (MOD) LVIDd:         4.83 cm         LV Biplane EF:   Left LVIDs:         3.55 cm                          ventricular LV PW:         1.33 cm                          ejection LV IVS:        1.27 cm                          fraction by LVOT diam:     2.00 cm                          2D MOD LV SV:  72                               biplane is LV SV Index:   37                               37.1 %. LVOT Area:     3.14 cm                                Diastology                                LV e' medial:    5.11 cm/s LV Volumes (MOD)               LV E/e' medial:  21.5 LV vol d, MOD    193.0 ml      LV e' lateral:   6.20 cm/s A2C:                           LV E/e' lateral: 17.7 LV vol d, MOD    208.0 ml A4C: LV vol s, MOD    113.0 ml A2C: LV vol s, MOD    131.0 ml A4C: LV SV MOD A2C:   80.0 ml LV SV MOD A4C:   208.0 ml LV SV MOD BP:    77.1 ml RIGHT VENTRICLE RV S prime:     12.10 cm/s LEFT ATRIUM             Index LA diam:        3.95 cm 2.01 cm/m LA Vol (A2C):   66.5 ml 33.83 ml/m LA Vol (A4C):   42.7 ml 21.72 ml/m LA Biplane Vol: 54.7 ml 27.83 ml/m  AORTIC VALVE AV Area (Vmax):    2.51 cm AV Area (Vmean):   2.50 cm AV Area (VTI):     2.65 cm AV Vmax:           124.00 cm/s AV  Vmean:          86.000 cm/s AV VTI:            0.271 m AV Peak Grad:      6.2 mmHg AV Mean Grad:      3.0 mmHg LVOT Vmax:         99.20 cm/s LVOT Vmean:        68.300 cm/s LVOT VTI:          0.229 m LVOT/AV VTI ratio: 0.85 MITRAL VALVE MV Area (PHT): 4.63 cm     SHUNTS MV Decel Time: 164 msec     Systemic VTI:  0.23 m MR Peak grad: 46.2 mmHg     Systemic Diam: 2.00 cm MR Vmax:      340.00 cm/s MV E velocity: 110.00 cm/s MV A velocity: 50.90 cm/s MV E/A ratio:  2.16 Lyman Bishop MD Electronically signed by Lyman Bishop MD Signature Date/Time: 01/14/2022/2:49:57 PM    Final    MR BRAIN WO CONTRAST  Result Date: 01/14/2022 CLINICAL DATA:  Neuro deficit, acute, stroke suspected. 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 EXAM: MRI HEAD WITHOUT CONTRAST TECHNIQUE: Multiplanar, multiecho pulse sequences of the brain and surrounding structures were  obtained without intravenous contrast. COMPARISON:  Head CT 01/11/2022 and MRI 01/09/2022 FINDINGS: The examination had to be discontinued prior to completion due to patient condition. Axial and coronal diffusion, axial T2* gradient echo, and axial T2 FLAIR sequences were obtained. The FLAIR sequence is severely motion degraded. Numerous patchy infarcts throughout the right MCA territory are unchanged in extent compared to the prior MRI, with the most confluent regions of infarct again noted to be near watershed areas. Small infarcts in the corpus callosum, right cerebral peduncle, and left frontal white matter are also unchanged. No definite new infarct or sizable intracranial hemorrhage is identified. There is no midline shift or hydrocephalus. Chronic white matter disease involving the cerebral hemispheres and brainstem are not well evaluated on this truncated, motion degraded study. IMPRESSION: 1. Motion degraded, incomplete examination. 2. Unchanged numerous infarcts as above, primarily involving the right MCA territory. 3.  No definite new intracranial abnormality. Electronically Signed   By: [name] M.D.   On: 01/14/2022 13:36     Assessment & Plan    1.  HFrEF with LVEF 35 to 40% and wall motion abnormalities consistent with LAD distribution ischemia versus stress-induced cardiomyopathy.  Managing medically at this point in light of active comorbidities.  2.  NSTEMI with peak high-sensitivity troponin I approximately 3900.  3.  Status post recent acute stroke on September 8 with occlusion of proximal right ICA, V3 segment of right vertebral artery, left paraclinoid ICA stenosis.  Underwent thrombectomy with IR.  Currently on aspirin and Brilinta.  4.  Acute kidney injury with CKD stage IV at baseline.  Creatinine down to 2.44.  5.  Blood loss anemia with hemoglobin to 7.0.  6.  Hypoxic respiratory failure with COPD/bronchospasm as well as rhinovirus positive.  Currently sedated on the ventilator with management per CCM.  Continue supportive measures.  Continue aspirin, Brilinta, metoprolol and Lipitor.  She is on IV heparin as well.  Lasix has been dosed daily, will give 60 mg twice daily for now, follow urine output and BMET in AM.  Signed, Rozann Lesches, MD  01/16/2022, 11:06 AM

## 2022-01-16 NOTE — Progress Notes (Signed)
STROKE TEAM PROGRESS NOTE   INTERVAL HISTORY RN at bedside. Husband at bedside.  She had an MRI done yesterday which is significant motion artifact however appears to be unchanged.  Per nurse and her husband she was able to talk overnight and say few words or phrases.  She did write on her board yesterday that she can speak but does not want to.  She is still able to follow commands on the right side.  Currently she is worse in terms of respiratory status and on BiPAP later in the day she got intubated.  CCM is consulted infectious disease and podiatry and cardiology is following.  Initially those plans to transfer out of the ICU yesterday but got canceled as she got worse.  She is still on nitro drip and heparin drip for evaded troponins and coronary syndrome.    CBC:  Recent Labs  Lab 01/15/22 0714 01/15/22 1622 01/16/22 0403  WBC 27.2*  --  18.8*  HGB 7.5* 11.6* 7.0*  HCT 23.2* 34.0* 22.0*  MCV 92.8  --  95.2  PLT 344  --  973   Basic Metabolic Panel:  Recent Labs  Lab 01/13/22 0429 01/14/22 0556 01/15/22 0714 01/15/22 1622 01/16/22 0403  NA 148*   < > 144 141 142  K 3.5   < > 4.2 4.0 3.6  CL 111   < > 109  --  104  CO2 28   < > 25  --  26  GLUCOSE 81   < > 124*  --  124*  BUN 53*   < > 53*  --  57*  CREATININE 2.73*   < > 2.19*  --  2.44*  CALCIUM 8.5*   < > 8.2*  --  7.9*  MG 2.4   < > 2.4  --  2.6*  PHOS 3.7  --   --   --  4.6   < > = values in this interval not displayed.   Lipid Panel:  Recent Labs  Lab 01/16/22 0403  TRIG 197*   HgbA1c:  Recent Labs  Lab 01/09/22 1425  HGBA1C 8.0*   Urine Drug Screen:  No results for input(s): "LABOPIA", "COCAINSCRNUR", "LABBENZ", "AMPHETMU", "THCU", "LABBARB" in the last 168 hours.  Alcohol Level  No results for input(s): "ETH" in the last 168 hours.   IMAGING past 24 hours DG CHEST PORT 1 VIEW  Result Date: 01/15/2022 CLINICAL DATA:  Stroke, history of CHF. EXAM: PORTABLE CHEST 1 VIEW COMPARISON:  Chest  radiograph earlier the same day FINDINGS: The enteric catheter tip courses into the stomach and off the field of view. The left IJ vascular catheter terminates in the expected location of the right atrium. The endotracheal tube tip is approximately 3.2 cm from the carina. Patchy opacities are again seen in both lungs, right worse than left, though overall improved since the study from earlier the same day. There is no pleural effusion or pneumothorax. The bones are stable. IMPRESSION: Improved aeration of both lungs since the study from earlier the same day. Electronically Signed   By: Valetta Mole M.D.   On: 01/15/2022 15:58    PHYSICAL EXAM  Temp:  [98.4 F (36.9 C)-100.1 F (37.8 C)] 99 F (37.2 C) (09/16 0800) Pulse Rate:  [67-116] 68 (09/16 1102) Resp:  [13-42] 28 (09/16 1102) BP: (77-190)/(48-128) 110/67 (09/16 1102) SpO2:  [90 %-100 %] 100 % (09/16 1102) FiO2 (%):  [40 %-100 %] 40 % (09/16 1102) Weight:  [86.7 kg]  86.7 kg (09/16 0500)  General - Well nourished, well developed, intubated on sedation.  Ophthalmologic - fundi not visualized due to noncooperation.  Cardiovascular - Regular rhythm and rate.  Neuro - intubated on propofol, drowsy sleepy, eyes briefly open on voice but can not maintain opening, able to follow simple central and peripheral commands. With eye opening, eyes in position, more right gaze preference but still not have complete right gaze, not cross midline, blinking to visual threat on the right but not on the left, PERRL. Corneal reflex present, gag and cough present. Breathing over the vent.  Facial symmetry not able to test due to ET tube.  Tongue protrusion not cooperative. On pain stimulation, RUE against gravity at least 4/5. RLE 3/5 on pain. LLE slight movement on pain but no movement of LUE. Sensation, coordination not cooperative and gait not tested.     ASSESSMENT/PLAN Ms. Kenady Doxtater is a 41 y.o. female with history of coronary artery disease with  myocardial infarction, heart failure with preserved EF, HTN, uncontrolled DM, hx of strokes, hypothyroidism, right arm ischemia,  on Eliquis diabetes complicated by neuropathy, CKD stage III, left foot diabetic ulcer s/p amputation of the fifth toe and now with concern for osteomyelitis of the left fourth metatarsal, who presented to the ED for acute onset of chest pain with Left side weakness noted at triage. NIHSS of 8. Patient not a TPA candidate, underwent IR thrombectomy of R ICA  with R ICA dissection repair and stent placement with TICI 3  Stroke:  Acute Right MCA and vertebral stroke s/p thrombectomy with TICI 3 with right ICA dissection repair and stent placement  Etiology:  unclear, likely cardioembolic  Code Stroke CT head 1. Periventricular white matter hypodensities adjacent to the frontal horn of the right lateral ventricles and in the right corona radiata are new since prior exam. This may represent acute ischemia. 2. No acute or focal cortical abnormality. 3. ASPECTS is 10/10 CTA head & neck with CT perfusion  1. 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 Right MCA and A1 ACA are patent, but diminutive. 2. Small/non dominant right vertebral artery, which is irregular throughout its V2 segment with age indeterminate occlusion at the V3 segment with non opacification of the proximal intradural vertebral artery and reconstitution of the distal intradural vertebral artery. 3. Severe left paraclinoid ICA stenosis. 4. One CT perfusion, no evidence of core infarct or penumbra; however, there is approximately 38 mL of T-max greater than 4 seconds in the right MCA territory which suggests possible oligemia. An MRI could provide more sensitive evaluation for acute infarct. 5. Moderate layering bilateral pleural effusions and peribronchial wall thickening. Recommend dedicated chest imaging  Cerebral  angio  Occluded right internal carotid artery extracranially and intracranially to the supraclinoid region Status post endovascular complete revascularization of occluded right internal carotid artery proximally and distally intracranially with 1 pass with a 4 mm x 40 mm solitary extragenital device and proximal aspiration, 1 pass with a 5 mm x 37 mm amber trap retrieval device and aspiration proximally, and 1 pass with a 6.5 mm x 45 mm ambulatory with proximal aspiration achieving a complete revascularization of the internal carotid artery.   Status post reconstruction of symptomatic occlusive long segment dissection extending from the proximal one third of the right internal carotid artery to the petrous cavernous junction using 3 pipeline flow diverter's and a 4 mm x 24  mm Neuroform Atlas stent in a  telescope fashion.  Final arteriogram from the right common carotid artery demonstrates complete revascularization without any filling defects with opacification of the right middle cerebral artery distribution maintaining aTICI 3 revascularization.  Post IR CT  1. Redemonstrated hypodensity in the right frontal periventricular white matter. No additional acute intracranial process. No additional acute infarct or hemorrhage. 2. Air-fluid levels with bubbly fluid in the maxillary sinuses and right sphenoid sinus are likely related to intubation but can be seen in the setting of sinusitis  MRI   1. Extensive patchy areas of acute nonhemorrhagic infarction involving the right MCA territory, most confluent areas along the watershed. 2. Focal nonhemorrhagic infarct involving the genu of the corpus callosum and more posteriorly along the body of the corpus callosum on the right. 3. Diffuse sinus disease is likely secondary to intubation MRA   1. Abnormal flow signal within the petrous and pre cavernous right ICA may represent artifact from stent or slow flow 2. Signal loss in the supraclinoid left ICA  may be artifactual. 3. Asymmetric diffused decreased size of right MCA branch vessels compared to the left likely reflects decreased perfusion. No significant proximal stenosis or occlusion  Repeat MRI 01/14/2022 shows motion degradation however essentially unchanged MRI with no evidence of new stroke.  2D Echo EF 30-35%, akinesis of mid to apical septal LV segments, all apical segments and apex.  LDL 39 HgbA1c 9.6 VTE prophylaxis - SCD's Eliquis (apixaban) daily prior to admission, now on aspirin 81 mg daily and Brilinta (ticagrelor) 90 mg bid.  She is also on heparin drip for coronary event. Therapy recommendations:  CIR Disposition:  pending  Hypertension Home meds:  hydralazine, metoprolol, isosorbide Stable Long-term BP goal normotensive  Hyperlipidemia Home meds:  fenofibrate, not resumed in hospital LDL 39, goal < 70 Started on atorvastatin. Continue at discharge   Diabetes type II UnControlled Metabolic Acidosis  DKA Home meds:  insulin HgbA1c 9.6, goal < 7.0 CBGs   Other Stroke Risk Factors  Former Cigarette smoker advised to stop smoking Obesity, Body mass index is 31.81 kg/m., BMI >/= 30 associated with increased stroke risk, recommend weight loss, diet and exercise as appropriate  Hx stroke/TIA Coronary artery disease Congestive heart failure  Other Active Problems Acute hypoxic respiratory Failure, resolved  managed by CCM Off prop Extubated 9/10, reintubated today On bipap this morning then later got reintubated  Chronic Combined HF EF 30-35%, decreased LV function, bubble study negative Meds on hold currently  Hypothyroidism  Home synthroid   AKI on CKD  IVF  Worsening Cr 2.50-3.07-3.63-3.80-4.17-4.54-4.16-2.73 Renal following  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

## 2022-01-16 NOTE — Progress Notes (Addendum)
[place] for heparin Indication: chest pain/ACS  Allergies  Allergen Reactions   Wellbutrin [Bupropion] Hives   Cefepime Rash    Tolerates penicilllin   Ciprofloxacin Hcl Hives and Rash    Hives/rash at injection site    Tape Rash    Patient Measurements: Height: 5\' 5"  (165.1 cm) Weight: 86.7 kg (191 lb 2.2 oz) IBW/kg (Calculated) : 57 Heparin Dosing Weight: 77kg  Vital Signs: Temp: 99 F (37.2 C) (09/Lauren 0800) Temp Source: Axillary (09/Lauren 0800) BP: 118/74 (09/Lauren 0900) Pulse Rate: 70 (09/Lauren 0900)  Labs: Recent Labs    01/13/22 1059 01/13/22 2054 01/14/22 0556 01/14/22 1410 01/14/22 2028 01/15/22 0714 01/15/22 1241 01/15/22 1622 01/15/22 2217 09/Lauren/23 0403  HGB  --    < > 7.7*  --   --  7.5*  --  11.6*  --  7.0*  HCT  --    < > 23.7*  --   --  23.2*  --  34.0*  --  22.0*  PLT  --   --  347  --   --  344  --   --   --  309  APTT 48*   < > 63* 45*   < > 64* 49*  --   --  66*  HEPARINUNFRC  --    < > 0.58  --   --  0.42 0.28*  --  0.42 0.32  CREATININE  --    < > 2.47* 2.48*  --  2.19*  --   --   --  2.44*  TROPONINIHS 2,880*  --   --   --   --   --   --   --   --   --    < > = values in this interval not displayed.    Estimated Creatinine Clearance: 33.7 mL/min (A) (by C-G formula based on SCr of 2.44 mg/dL (H)).   Assessment: Lauren [name] admitted with R MCA vertebral stroke s/p thrombectomy and stent placement. Pt was on apixaban prior to admit, last dose 9/8 am. Pt with elevated troponins and now chest pain, pharmacy asked to dose IV heparin for ACS management. Will defer bolus and utilize lower goal in setting of recent CVA.  Heparin level 0.32 and aPTT 66 are both at low end of therapeutic on 1500 units/hr. HL and aPTT are correlating, will follow HL from now. No issues with infusion per RN but of note, patient started her period 9/15 PM. H/H 7.5> 7, plt stable.  Goal of Therapy:  Heparin level 0.3-0.5 units/ml Monitor  platelets by anticoagulation protocol: Yes   Plan:  Continue heparin to 1500 units/hr Monitor daily heparin level, CBC Monitor for signs/symptoms of bleeding   Benetta Spar, PharmD, BCPS, BCCP Clinical Pharmacist  Please check AMION for all [place] phone numbers After 10:00 PM, call [place] [phone]

## 2022-01-16 NOTE — Progress Notes (Addendum)
Pharmacy Antibiotic Note  Lauren [name] is a 41 y.o. female admitted on 01/08/2022 with right MCA stroke and vertebral artery stroke. Patient on chronic augmentin PTA for LLE toe OM. 9/15 with possible aspiration event. ID following. Pharmacy has been consulted for Vancomycin and ciprofloxacin dosing for osteomyelitis and HCAP. Noted cefepime allergy though has tolerated cephalosporins many times before documentation and has received cefazolin since then. Noted cipro allergy but ok to given per ID, seems to be tolerating.   Scr trended up. ClCr ~34 ml/min.  Afebrile. WBC 18 down trending (on steroid). Cultures pending. Per RN, no reaction to IV cipro.   9/16 Vancomycin 500mg  Q 24 hr Scr used: 2.44 mg/dL Weight: 86.7 kg Vd coeff: 0.5 L/kg Est AUC: 419  Plan: Adjust ciprofloxacin to 400mg  IV Q24H Start vancomycin 500mg  Q24hr  Monitor cultures, clinical status, renal function, vancomycin level Narrow abx as able and f/u duration  F/u bone biopsy pending per podiatry.  Height: 5\' 5"  (165.1 cm) Weight: 86.7 kg (191 lb 2.2 oz) IBW/kg (Calculated) : 57  Temp (24hrs), Avg:99.4 F (37.4 C), Min:98.4 F (36.9 C), Max:100.1 F (37.8 C)  Recent Labs  Lab 01/12/22 0203 01/13/22 0429 01/14/22 0556 01/14/22 1410 01/15/22 0714 01/16/22 0403  WBC 19.1* 22.0* 22.6*  --  27.2* 18.8*  CREATININE 3.58* 2.73* 2.47* 2.48* 2.19* 2.44*     Estimated Creatinine Clearance: 33.7 mL/min (A) (by C-G formula based on SCr of 2.44 mg/dL (H)).    Allergies  Allergen Reactions   Wellbutrin [Bupropion] Hives   Cefepime Rash    Tolerates penicilllin   Ciprofloxacin Hcl Hives and Rash    Hives/rash at injection site    Tape Rash    Antimicrobials this admission: Augmentin 8/14 >> 9/15 Vanc 9/15 >> Cipro 9/15 >>  Dose adjustments this admission: 9/16 cipro 200 q12 > 400 q24hr   Microbiology results: 9/8 Resp PCR: +rhino/enterovirus 9/8 BCx: ngtd 9/8 MRSA +  9/9 TA: normal flora 9/15 Bcx:  pending  Thank you for allowing pharmacy to be a part of this patient's care.  Benetta Spar, PharmD, BCPS, BCCP Clinical Pharmacist  Please check AMION for all [place] phone numbers After 10:00 PM, call [place] [phone]

## 2022-01-16 NOTE — Progress Notes (Signed)
NAME:  Lauren [name], MRN:  [id number], DOB:  Sep 16, 1980, LOS: 8 ADMISSION DATE:  01/08/2022, CONSULTATION DATE:  01/08/2022 REFERRING MD:  Dr Curly [place], CHIEF COMPLAINT:  Stroke   History of Present Illness:  41 y/o F with known CAD, HFrEF, DM I c/b neuropathy, stage III CKD, peripheral vascular disease admitted with R MCA stroke and vertebral artery stroke.   Patient presented to the ED 9/8 with left sided facial droop, left sided weakness, and slurred speech.  LKW: unknown.  Code stroke initiated. NIHSS: 8.  Imaging revealed: age indeterminate occlusion of proximal right ICA, V3 segment of right vertebral artery, left paraclinoid ICA stenosis. No core infarct or penumbra. Patient went immediately to IR for thrombectomy.  Of note, patient with recent arterial thrombectomy of the right upper extremity due to limb ischemia.  She has had URI x 2 days with recent sick contact.   COVID negative but Rhinovirus positive. Extubated in ICU post procedure.    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  NIHSS 8, to IR  for thrombectomy, intubated.  MRSA PCR +, Rhinovirus + 9/10 PSV wean, moving RUE/RLE, extubated  9/11 ECHO>, CT head negative for acute changes, Trach asp negative,  9/13 started on HFNC 10L overnight 9/14 tmax 101.2, remains on HFNC, MRI, Heme/onc and ID consult  Interim History / Subjective:   Patient was reintubated due to pulmonary edema yesterday.  Remains on mechanical support.  Objective   Blood pressure 119/73, pulse 70, temperature 99 F (37.2 C), temperature source Axillary, resp. rate (!) 28, height 5'  5" (1.651 m), weight 86.7 kg, SpO2 100 %.    Vent Mode: PRVC FiO2 (%):  [40 %-100 %] 40 % Set Rate:  [22 bmp-28 bmp] 28 bmp Vt Set:  [400 mL-450 mL] 450 mL PEEP:  [6 cmH20-10 cmH20] 10 cmH20 Pressure Support:  [6 cmH20] 6 cmH20 Plateau Pressure:  [15 cmH20-23 cmH20] 23 cmH20   Intake/Output Summary (Last 24 hours) at 01/16/2022 0846 Last data filed at 01/16/2022 0800 Gross per 24 hour  Intake 2353.4 ml  Output 2600 ml  Net -246.6 ml   Filed Weights   01/14/22 0500 01/15/22 0500 01/16/22 0500  Weight: 89.4 kg 88.6 kg 86.7 kg   Examination: General: Young female intubated on mechanical life support critically ill HEENT: Mucous membranes moist Neuro: Sedated on mechanical support left upper extremity no withdrawal to pain, right upper extremity withdraws to pain CV: Regular rate rhythm, S1-S2 PULM: Bilateral mechanically ventilated breath sounds GI: s obese soft nontender nondistended Extremities: Dependent edema in upper and lower extremities as well as flanks  Labs reviewed White blood cell count decreasing 27,000 now 18,000  Resolved Hospital Problem list     Assessment & Plan:   Acute Hypoxic Respiratory Failure  Rhinovirus Positive  Acute Exacerbation of Suspected COPD / Bronchospasm  Tobacco Abuse, 1 pack/day Plan: Reintubated chest x-ray appears consistent with volume overload. Bilateral pulmonary edema Continue Lasix Continue Solu-Medrol, Brovana, Pulmicort, Yupelri Continue vancomycin plus ciprofloxacin  Right MCA and right vertebral stroke s/p thrombectomy, right ICA dissection repair, stent placement Hx R Hand Ischemia  s/p Thrombectomy 10/2021 Dysphagia  Not a candidate for thrombolytics, was on Eliquis PTA. Post procedure CT Head negative for ICH. S/p cangrelor. Repeat CTH on 9/11 negative for acute changes Plan: Continue management per neurology appreciate neuro and IR input. - MRI 9/14< motion degraded, incomplete test, unchanged numerous infarcts,  primarily involving the R MCA territory Remains on aspirin/Brilinta, serial neuro exams Will need SLP PT OT once extubated.  AKI on CKD IV Acute Metabolic Acidosis 2/2 DKA  Possible Contrast Induced Nephropathy  Plan: Follow BMP urine output Replace electrolytes as needed Avoid nephrotoxic agents  Normocytic Anemia Multifactorial in setting of blood loss, chronic disease. PRBC 9/10,  -Transfuse PRBC if HBG less than 7 - H/H remains stable - trend on CBC, transfuse for Hgb< 7 - monitor for bleeding, no evidence of   Chronic Systolic & Diastolic CHF  CAD  Troponin elevation, NSTEMI Chest Discomfort  HTN ECHO 10/2021 with LVEF 40-45%, G2DDEcho 9/10: LVEF 30 to 35%, left ventricle mildly decreased function, akinesis of the mid to apical septal LV segments, all apical segments, and apex.  Significant thinning of the mid to apical septal segments and apex reported as likely representing scarring/infarct. Patient with C/O chest pain overnight 9/11>  Started on heparin gtt and NTG GTT by cards. Troponin: 71 > 1.3 K >8 16 > 3.8 K >3.8 K >3.5 K> 2.8K.  Chest pain resumed again on 9/13, nitro resumed.  No significant changes on EKG.  - cards following, appreciate input - repeat Echo 9/14 showing EF w/ EF 35-40%, G2DD, mid to apical ventricular wall thinning and severe hypokinesis to akinesis Plan: Stop nitro today Continue metoprolol, Imdur, statin, aspirin, Brilinta Continue heparin infusion  Hypothyroidism  -Continue Synthroid per tube  Osteomyelitis of L Foot with Diabetic Ulcer, leukocytosis -Appreciate podiatry and ID input.  -Wound care per podiatry orders> recommend Betadine with dry dressings as needed. - plans for bone barrow biopsy for abx guidance - cx pending  Plan: Continue ciprofloxacin plus vancomycin Appreciate ID input  Hypokalemia Hypernatremia, resolved  Continue free water  DM I Off insulin infusion, continue SSI, tube feed coverage with Levemir and short  acting insulin  At Risk Malnutrition  Continue tube feeds  Prior RUE arterial occlusion 10/2021 s/p thrombectomy on Eliquis - no hx of DVT/ PE.  Concern for hypercoagulability given events on Eliquis.   Hypercoagulable work-up pending We will have outpatient hematology follow-up.  Best Practice (right click and "Reselect all SmartList Selections" daily)  Diet/type: tubefeeds DVT prophylaxis: systemic heparin GI prophylaxis: PPI Lines: No longer needed.  Order written to d/c  Foley:  N/A Code Status:  full code Last date of multidisciplinary goals of care discussion: 9/11. Husband updated at bedside 9/15   This patient is critically ill with multiple organ system failure; which, requires frequent high complexity decision making, assessment, support, evaluation, and titration of therapies. This was completed through the application of advanced monitoring technologies and extensive interpretation of multiple databases. During this encounter critical care time was devoted to patient care services described in this note for 32 minutes.  Garner Nash, DO [facility] 01/16/2022 10:53 AM

## 2022-01-17 ENCOUNTER — Inpatient Hospital Stay (HOSPITAL_COMMUNITY): Payer: No Typology Code available for payment source

## 2022-01-17 DIAGNOSIS — I6521 Occlusion and stenosis of right carotid artery: Secondary | ICD-10-CM | POA: Diagnosis not present

## 2022-01-17 DIAGNOSIS — I5020 Unspecified systolic (congestive) heart failure: Secondary | ICD-10-CM | POA: Diagnosis not present

## 2022-01-17 DIAGNOSIS — J9601 Acute respiratory failure with hypoxia: Secondary | ICD-10-CM | POA: Diagnosis not present

## 2022-01-17 LAB — GLUCOSE, CAPILLARY
Glucose-Capillary: 142 mg/dL — ABNORMAL HIGH (ref 70–99)
Glucose-Capillary: 159 mg/dL — ABNORMAL HIGH (ref 70–99)
Glucose-Capillary: 201 mg/dL — ABNORMAL HIGH (ref 70–99)
Glucose-Capillary: 220 mg/dL — ABNORMAL HIGH (ref 70–99)
Glucose-Capillary: 305 mg/dL — ABNORMAL HIGH (ref 70–99)
Glucose-Capillary: 337 mg/dL — ABNORMAL HIGH (ref 70–99)
Glucose-Capillary: 354 mg/dL — ABNORMAL HIGH (ref 70–99)

## 2022-01-17 LAB — BASIC METABOLIC PANEL
Anion gap: 12 (ref 5–15)
BUN: 75 mg/dL — ABNORMAL HIGH (ref 6–20)
CO2: 25 mmol/L (ref 22–32)
Calcium: 7.8 mg/dL — ABNORMAL LOW (ref 8.9–10.3)
Chloride: 99 mmol/L (ref 98–111)
Creatinine, Ser: 2.41 mg/dL — ABNORMAL HIGH (ref 0.44–1.00)
GFR, Estimated: 26 mL/min — ABNORMAL LOW (ref >60)
Glucose, Bld: 353 mg/dL — ABNORMAL HIGH (ref 70–99)
Potassium: 4.3 mmol/L (ref 3.5–5.1)
Sodium: 136 mmol/L (ref 135–145)

## 2022-01-17 LAB — HEPARIN LEVEL (UNFRACTIONATED)
Heparin Unfractionated: 0.2 IU/mL — ABNORMAL LOW (ref 0.30–0.70)
Heparin Unfractionated: 0.51 IU/mL (ref 0.30–0.70)

## 2022-01-17 LAB — CBC
HCT: 22.4 % — ABNORMAL LOW (ref 36.0–46.0)
Hemoglobin: 7.1 g/dL — ABNORMAL LOW (ref 12.0–15.0)
MCH: 30.1 pg (ref 26.0–34.0)
MCHC: 31.7 g/dL (ref 30.0–36.0)
MCV: 94.9 fL (ref 80.0–100.0)
Platelets: 335 10*3/uL (ref 150–400)
RBC: 2.36 MIL/uL — ABNORMAL LOW (ref 3.87–5.11)
RDW: 14.1 % (ref 11.5–15.5)
WBC: 14.6 10*3/uL — ABNORMAL HIGH (ref 4.0–10.5)
nRBC: 0 % (ref 0.0–0.2)

## 2022-01-17 LAB — CARDIOLIPIN ANTIBODIES, IGG, IGM, IGA
Anticardiolipin IgA: <9 APL U/mL (ref 0–11)
Anticardiolipin IgG: <9 GPL U/mL (ref 0–14)
Anticardiolipin IgM: <9 MPL U/mL (ref 0–12)

## 2022-01-17 LAB — APTT: aPTT: 51 seconds — ABNORMAL HIGH (ref 24–36)

## 2022-01-17 LAB — RHEUMATOID FACTOR: Rheumatoid fact SerPl-aCnc: 18.7 IU/mL — ABNORMAL HIGH (ref <14.0)

## 2022-01-17 MED ORDER — FUROSEMIDE 10 MG/ML IJ SOLN
80.0000 mg | Freq: Two times a day (BID) | INTRAMUSCULAR | Status: DC
  Administered 2022-01-17 – 2022-01-20 (×6): 80 mg via INTRAVENOUS
  Filled 2022-01-17 (×6): qty 8

## 2022-01-17 MED ORDER — INSULIN DETEMIR 100 UNIT/ML ~~LOC~~ SOLN
15.0000 [IU] | Freq: Two times a day (BID) | SUBCUTANEOUS | Status: DC
  Administered 2022-01-17 (×2): 15 [IU] via SUBCUTANEOUS
  Filled 2022-01-17 (×4): qty 0.15

## 2022-01-17 NOTE — Progress Notes (Signed)
   Progress Note  Patient Name: Nabila Albarracin Date of Encounter: 01/17/2022  Primary Cardiologist: Carlyle Dolly, MD  Continuing to follow, see note from yesterday.  Lasix placed at 60 mg IV twice daily standing dose with increase in urine output but still no major net output in light of ongoing intake/supplementation needs.  Renal function is relatively stable with creatinine 2.41.  Hemoglobin steady at 7.1.  I reviewed CCM note.  Continue Brilinta, heparin, Ismo, Lopressor, and Lipitor.  Increase Lasix to 80 mg IV twice daily.  Signed, Rozann Lesches, MD  01/17/2022, 10:34 AM

## 2022-01-17 NOTE — Progress Notes (Signed)
STROKE TEAM PROGRESS NOTE   INTERVAL HISTORY No family at bedside.  Patient still intubated, however, spontaneous eye opening, follows simple commands on the right hand and foot. Per RN, CCM plan to extubate tomorrow. Still on diuresis to maximize the change of no re-intubation  CBC:  Recent Labs  Lab 01/16/22 0403 01/17/22 0544  WBC 18.8* 14.6*  HGB 7.0* 7.1*  HCT 22.0* 22.4*  MCV 95.2 94.9  PLT 309 195   Basic Metabolic Panel:  Recent Labs  Lab 01/13/22 0429 01/14/22 0556 01/15/22 0714 01/15/22 1622 01/16/22 0403 01/17/22 0544  NA 148*   < > 144   < > 142 136  K 3.5   < > 4.2   < > 3.6 4.3  CL 111   < > 109  --  104 99  CO2 28   < > 25  --  26 25  GLUCOSE 81   < > 124*  --  124* 353*  BUN 53*   < > 53*  --  57* 75*  CREATININE 2.73*   < > 2.19*  --  2.44* 2.41*  CALCIUM 8.5*   < > 8.2*  --  7.9* 7.8*  MG 2.4   < > 2.4  --  2.6*  --   PHOS 3.7  --   --   --  4.6  --    < > = values in this interval not displayed.   Lipid Panel:  Recent Labs  Lab 01/16/22 0403  TRIG 197*   HgbA1c:  No results for input(s): "HGBA1C" in the last 168 hours.  Urine Drug Screen:  No results for input(s): "LABOPIA", "COCAINSCRNUR", "LABBENZ", "AMPHETMU", "THCU", "LABBARB" in the last 168 hours.  Alcohol Level  No results for input(s): "ETH" in the last 168 hours.   IMAGING past 24 hours DG CHEST PORT 1 VIEW  Result Date: 01/17/2022 CLINICAL DATA:  Follow-up exam.  Patient on ventilator. EXAM: PORTABLE CHEST 1 VIEW COMPARISON:  01/15/2022 and older studies. FINDINGS: Bilateral interstitial and patchy airspace lung opacities have improved. No new lung abnormalities. No convincing pleural effusion and no pneumothorax. Endotracheal tube, left internal jugular central venous line and enteric tube are stable. IMPRESSION: 1. Significant interval improvement in lung aeration. Electronically Signed   By: Lajean Manes M.D.   On: 01/17/2022 10:10    PHYSICAL EXAM  Temp:  [97.2 F (36.2  C)-98.9 F (37.2 C)] 97.2 F (36.2 C) (09/17 1200) Pulse Rate:  [62-92] 75 (09/17 1300) Resp:  [12-35] 17 (09/17 1300) BP: (108-143)/(64-87) 119/76 (09/17 1300) SpO2:  [96 %-100 %] 96 % (09/17 1300) FiO2 (%):  [40 %] 40 % (09/17 1158)  General - Well nourished, well developed, intubated on sedation.  Ophthalmologic - fundi not visualized due to noncooperation.  Cardiovascular - Regular rhythm and rate.  Musculoskeletal - left foot with blood tinged dressing post bone biopsy  Neuro - intubated on propofol and fentanyl, eyes spontaneous open, able to follow simple peripheral commands, but did not follow central comments today. Eyes more right gaze preference but still not have complete right gaze, not cross midline, blinking to visual threat on the right but not on the left, PERRL. Corneal reflex present, gag and cough present. Breathing over the vent.  Facial symmetry not able to test due to ET tube.  Tongue protrusion not cooperative. On pain stimulation, RUE against gravity at least 4/5. RLE 3/5 on pain. LLE slight movement on pain but no movement of LUE. Sensation, coordination  not cooperative and gait not tested.   ASSESSMENT/PLAN Ms. Flecia Shutter is a 41 y.o. 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 NIHSS of 8. Patient not a TPA candidate, underwent IR thrombectomy of R ICA  with R ICA dissection repair and stent placement with TICI 3  Amaurosis fugax for 2 weeks Stroke:  Acute Right MCA patchy infarcts and punctate left MCA/ACA infarct with right ICA occlusion s/p IR  with TICI 3 but right ICA dissection s/p telescope stent placement, etiology unclear, likely large vessel disease, cannot completely rule out cardioembolic source CT head Periventricular white matter hypodensities adjacent to the frontal horn of the right lateral ventricles and in the right corona radiata are new since prior exam. This may represent acute ischemia. CTA head & neck - Age indeterminate occlusion of the proximal right ICA in the neck with non opacification distally in the upper neck and intracranially. Right MCA and A1 ACA are patent, but diminutive. Severe left paraclinoid ICA stenosis. CT perfusion, no evidence of core infarct or penumbra; however, there is approximately 38 mL of T-max greater than 4 seconds in the right MCA territory which suggests possible oligemia. IR with TICI3 of intracranial R ICA, however complicated by ICA dissection status post telescope stents Post IR CT - Redemonstrated hypodensity in the right frontal WM MRI x 2 Extensive patchy areas of acute infarction involving the right MCA territory, most confluent areas along the watershed. MRA  Asymmetric diffused decreased size of right MCA branch vessels compared to the left likely reflects decreased perfusion.  2D Echo EF 30-35%, akinesis of mid to apical septal LV segments, all apical segments and apex.  LDL 39 HgbA1c 8.0 Hypercoag work-up pending VTE prophylaxis -heparin IV Eliquis (apixaban) daily prior to admission, now on heparin IV and Brilinta (ticagrelor) 90 mg bid.  Therapy recommendations: Pending Disposition:  pending  History of stroke 12/2019 left face and arm numbness.  MRI showed right midbrain infarct.  MRI bilateral siphon atherosclerosis.  EF 55 to 60%.  Carotid Doppler negative.  LDL 84.9, A1c 10.3, UDS negative.  Discharged on DAPT.  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  On augmentin at home chronically  Blood culture so far negative ID on board, now on vancomycin and Cipro podiatry on board, status post bone biopsy 9/16  Ischemic limb 10/2021 right upper extremity ischemia Status post thrombectomy On Eliquis PTA Now on heparin IV  Anemia  Hgb 8.2->PRBC 9/10-> 8.7-7.5-> PRBC 9/14-> 11.6-> 7.0->7.1 Continue brilinta and heparin IV D/c ASA CBC monitoring Transfer if hemoglobin less than 7  Leukocytosis Improving 26.5-19.4-17.3-22.0-22.6-27.2-18.8->14.6 On vancomycin and Cipro as above  ID on board  Dysphagia  ST on board NG in place On tube feeding @  81 and free water 300 Q4  Hypertension Hypotension Home meds:  hydralazine, metoprolol, isosorbide Stable on the low end Off Levophed On metoprolol and imdur and lasix Long-term BP goal normotensive  Hyperlipidemia Home meds:  fenofibrate and Lipitor 40 LDL 39, goal < 70 Resumed atorvastatin 40 Continue at discharge   Diabetes type II UnControlled Metabolic Acidosis  DKA Home meds:  insulin HgbA1c 8.0, goal < 7.0 CBGs SSI Levemir 15 U bid  Tobacco abuse Current smoker Smoking cessation counseling will be provided Nicotine patch provided  Other Stroke Risk Factors Obesity, Body mass index is 31.81 kg/m., BMI >/= 30 associated with increased stroke risk, recommend weight loss, diet and exercise as appropriate  Hx stroke/TIA  Other Active Problems Hypothyroidism, on home Synthroid   Hospital day # 9   This patient is critically ill due to stroke, ischemic limb, sepsis, AKI, leukocytosis, anemia, respiratory failure and at  significant risk of neurological worsening, death form recurrent stroke, hemorrhagic transformation, septic shock, renal failure, heart failure. This patient's care requires constant monitoring of vital signs, hemodynamics, respiratory and cardiac monitoring, review of multiple databases, neurological assessment, discussion with family, other specialists and medical decision making of high complexity. I spent 35 minutes of neurocritical care time in the care of this patient.  Rosalin Hawking, MD PhD Stroke Neurology 01/17/2022 1:35 PM

## 2022-01-17 NOTE — Progress Notes (Signed)
[facility] for heparin Indication: chest pain/ACS  Allergies  Allergen Reactions   Wellbutrin [Bupropion] Hives   Cefepime Rash    Tolerates penicilllin   Ciprofloxacin Hcl Hives and Rash    Hives/rash at injection site; 01/15/22 tolerated IV cipro   Tape Rash    Patient Measurements: Height: 5\' 5"  (165.1 cm) Weight: 86.7 kg (191 lb 2.2 oz) IBW/kg (Calculated) : 57 Heparin Dosing Weight: 77kg  Vital Signs: Temp: 97.8 F (36.6 C) (09/17 0800) Temp Source: Axillary (09/17 0800) BP: 143/87 (09/17 0900) Pulse Rate: 85 (09/17 0900)  Labs: Recent Labs    01/15/22 0714 01/15/22 1241 01/15/22 1622 01/15/22 2217 01/16/22 0403 01/17/22 0544  HGB 7.5*  --  11.6*  --  7.0* 7.1*  HCT 23.2*  --  34.0*  --  22.0* 22.4*  PLT 344  --   --   --  309 335  APTT 64* 49*  --   --  66* 51*  HEPARINUNFRC 0.42 0.28*  --  0.42 0.32 0.20*  CREATININE 2.19*  --   --   --  2.44* 2.41*     Estimated Creatinine Clearance: 34.1 mL/min (A) (by C-G formula based on SCr of 2.41 mg/dL (H)).   Assessment: 66 yoF admitted with R MCA vertebral stroke s/p thrombectomy and stent placement. Pt was on apixaban prior to admit, last dose 9/8 am. Pt with elevated troponins and now chest pain, pharmacy asked to dose IV heparin for ACS management. Will defer bolus and utilize lower goal in setting of recent CVA.  Heparin level 0.2 and aPTT 51 are subtherapeutic despite same rate of 1500 units/hr.  No issues with infusion or bleeding per RN. Had bone biopsy 9/16 but heparin was continued. Increase slightly given labile levels on same rates.   Goal of Therapy:  Heparin level 0.3-0.5 units/ml Monitor platelets by anticoagulation protocol: Yes   Plan:  Increase heparin to 1550 units/hr F/u 8hr Heparin level Monitor daily heparin level, CBC Monitor for signs/symptoms of bleeding   Benetta Spar, PharmD, BCPS, BCCP Clinical Pharmacist  Please check AMION for all [facility] phone numbers After 10:00 PM, call [place]

## 2022-01-17 NOTE — Progress Notes (Signed)
[facility] for heparin Indication: chest pain/ACS  Allergies  Allergen Reactions   Wellbutrin [Bupropion] Hives   Cefepime Rash    Tolerates penicilllin   Ciprofloxacin Hcl Hives and Rash    Hives/rash at injection site; 01/15/22 tolerated IV cipro   Tape Rash    Patient Measurements: Height: 5\' 5"  (165.1 cm) Weight: 86.7 kg (191 lb 2.2 oz) IBW/kg (Calculated) : 57 Heparin Dosing Weight: 77kg  Vital Signs: Temp: 97.3 F (36.3 C) (09/17 1600) Temp Source: Axillary (09/17 1600) BP: 120/76 (09/17 1700) Pulse Rate: 70 (09/17 1700)  Labs: Recent Labs    01/15/22 0714 01/15/22 1241 01/15/22 1622 01/15/22 2217 01/16/22 0403 01/17/22 0544 01/17/22 1620  HGB 7.5*  --  11.6*  --  7.0* 7.1*  --   HCT 23.2*  --  34.0*  --  22.0* 22.4*  --   PLT 344  --   --   --  309 335  --   APTT 64* 49*  --   --  66* 51*  --   HEPARINUNFRC 0.42 0.28*  --    < > 0.32 0.20* 0.51  CREATININE 2.19*  --   --   --  2.44* 2.41*  --    < > = values in this interval not displayed.     Estimated Creatinine Clearance: 34.1 mL/min (A) (by C-G formula based on SCr of 2.41 mg/dL (H)).   Assessment: 46 yoF admitted with R MCA vertebral stroke s/p thrombectomy and stent placement. Pt was on apixaban prior to admit, last dose 9/8 am. Pt with elevated troponins and now chest pain, pharmacy asked to dose IV heparin for ACS management. Will defer bolus and utilize lower goal in setting of recent CVA.  Heparin level 0.2 and aPTT 51 are subtherapeutic despite same rate of 1500 units/hr.  No issues with infusion or bleeding per RN. Had bone biopsy 9/16 but heparin was continued. Increase slightly given labile levels on same rates.   HL 0.51, cont current rate and check confirm in AM.  Goal of Therapy:  Heparin level 0.3-0.5 units/ml Monitor platelets by anticoagulation protocol: Yes   Plan:  Cont heparin 1550 units/hr Monitor daily heparin level, CBC Monitor for  signs/symptoms of bleeding   Onnie Boer, PharmD, BCIDP, AAHIVP, CPP Infectious Disease Pharmacist 01/17/2022 5:52 PM

## 2022-01-17 NOTE — Progress Notes (Signed)
NAME:  Lauren [name], MRN:  [id number], DOB:  [birth year], LOS: 9 ADMISSION DATE:  01/08/2022, CONSULTATION DATE:  01/08/2022 REFERRING MD:  Dr Curly Shores, CHIEF COMPLAINT:  Stroke   History of Present Illness:  41 y/o F with known CAD, HFrEF, DM I c/b neuropathy, stage III CKD, peripheral vascular disease admitted with R MCA stroke and vertebral artery stroke.   Patient presented to the ED 9/8 with left sided facial droop, left sided weakness, and slurred speech.  LKW: unknown.  Code stroke initiated. NIHSS: 8.  Imaging revealed: age indeterminate occlusion of proximal right ICA, V3 segment of right vertebral artery, left paraclinoid ICA stenosis. No core infarct or penumbra. Patient went immediately to IR for thrombectomy.  Of note, patient with recent arterial thrombectomy of the right upper extremity due to limb ischemia.  She has had URI x 2 days with recent sick contact.   COVID negative but Rhinovirus positive. Extubated in ICU post procedure.    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  NIHSS 8, to IR  for thrombectomy, intubated.  MRSA PCR +, Rhinovirus + 9/10 PSV wean, moving RUE/RLE, extubated  9/11 ECHO>, CT head negative for acute changes, Trach asp negative,  9/13 started on HFNC 10L overnight 9/14 tmax 101.2, remains on HFNC, MRI, Heme/onc and ID consult  Interim History / Subjective:   Remains intubated on mechanical support still positive chemo fluid balance.  More calm on mechanical support on Precedex.  Objective   Blood pressure 135/80, pulse 64, temperature 97.8 F (36.6 C), temperature source  Axillary, resp. rate (!) 28, height 5\' 5"  (1.651 m), weight 86.7 kg, SpO2 100 %.    Vent Mode: CPAP;PSV FiO2 (%):  [40 %] 40 % Set Rate:  [28 bmp] 28 bmp Vt Set:  [450 mL] 450 mL PEEP:  [5 cmH20-10 cmH20] 5 cmH20 Pressure Support:  [10 cmH20] 10 cmH20 Plateau Pressure:  [18 cmH20-23 cmH20] 18 cmH20   Intake/Output Summary (Last 24 hours) at 01/17/2022 0919 Last data filed at 01/17/2022 0800 Gross per 24 hour  Intake 2794.53 ml  Output 2250 ml  Net 544.53 ml   Filed Weights   01/14/22 0500 01/15/22 0500 01/16/22 0500  Weight: 89.4 kg 88.6 kg 86.7 kg   Examination: General: Young female intubated on mechanical life support HEENT: Mucous brains moist Neuro: Comfortable, less sedate awake following commands CV: Regular rate rhythm S1-S2 PULM: Bilateral mechanically ventilated breath sounds GI: s obese soft nontender Extremities: Deep edema in the upper and lower extremities, left foot wrapped in bandage following bone biopsy  Labs reviewed White blood cell count decreasing 27,000 now 18,000  Resolved Hospital Problem list     Assessment & Plan:   Acute Hypoxic Respiratory Failure  Rhinovirus Positive  Acute Exacerbation of Suspected COPD / Bronchospasm  Tobacco Abuse, 1 pack/day Plan: She has been intubated now twice. Her oxygen requirements have improved with diuresis. Continue Lasix 60 mg twice daily as she is still have a positive cumulative fluid balance and looks fluid overloaded on exam. Continue Solu-Medrol, [place], Pulmicort, Yupelri Continue vancomycin and ciprofloxacin for possible infection and  left foot  Right MCA and right vertebral stroke s/p thrombectomy, right ICA dissection repair, stent placement Hx R Hand Ischemia s/p Thrombectomy 10/2021 Dysphagia  Not a candidate for thrombolytics, was on Eliquis PTA. Post procedure CT Head negative for ICH. S/p cangrelor. Repeat CTH on 9/11 negative for acute changes Plan: Appreciate management from neurology and  IR. - MRI 9/14< motion degraded, incomplete test, unchanged numerous infarcts, primarily involving the R MCA territory Plan: Remains on aspirin and Brilinta, serial neuro exams SLP PT OT once extubated  AKI on CKD IV Acute Metabolic Acidosis 2/2 DKA  Possible Contrast Induced Nephropathy  Plan: Follow urine output replace electrolytes as needed Avoid nephrotoxic agents  Normocytic Anemia Multifactorial in setting of blood loss, chronic disease. PRBC 9/10,  Plan: Transfuse RBCs less than 7, watch hemoglobin Trend CBC Monitor for any evidence of bleeding.  Chronic Systolic & Diastolic CHF  CAD  Troponin elevation, NSTEMI Chest Discomfort  HTN ECHO 10/2021 with LVEF 40-45%, G2DDEcho 9/10: LVEF 30 to 35%, left ventricle mildly decreased function, akinesis of the mid to apical septal LV segments, all apical segments, and apex.  - cards following, appreciate input - repeat Echo 9/14 showing EF w/ EF 35-40%, G2DD, mid to apical ventricular wall thinning and severe hypokinesis to akinesis Plan: Continue metoprolol, Imdur, statin, aspirin, Brilinta, heparin infusion I will make sure that she is euvolemic before consideration for extubation again  Hypothyroidism  -Continue Synthroid per tube  Osteomyelitis of L Foot with Diabetic Ulcer, leukocytosis -Appreciate podiatry and ID input.  -Wound care per podiatry orders> recommend Betadine with dry dressings as needed. - plans for bone barrow biopsy for abx guidance - cx pending  Plan: Continue ciprofloxacin plus vancomycin Appreciate podiatry for bone biopsy and infectious disease input  Hypokalemia Hypernatremia, resolved  Continue free water  DM I Continue SSI, Levemir for coverage  Increase to resistant scale yesterday  At Risk Malnutrition  Continue tube feeds  Prior RUE arterial occlusion 10/2021 s/p thrombectomy on Eliquis - no hx of DVT/ PE.  Concern for hypercoagulability given events on Eliquis.   Hypercoagulable  work-up pending Will need outpatient hypercoagulability work-up  Best Practice (right click and "Reselect all SmartList Selections" daily)  Diet/type: tubefeeds DVT prophylaxis: systemic heparin GI prophylaxis: PPI Lines: No longer needed.  Order written to d/c  Foley:  N/A Code Status:  full code Last date of multidisciplinary goals of care discussion: 9/11. Husband updated at bedside 9/15  This patient is critically ill with multiple organ system failure; which, requires frequent high complexity decision making, assessment, support, evaluation, and titration of therapies. This was completed through the application of advanced monitoring technologies and extensive interpretation of multiple databases. During this encounter critical care time was devoted to patient care services described in this note for 32 minutes.  [facility] 01/17/2022 9:19 AM

## 2022-01-18 ENCOUNTER — Ambulatory Visit: Payer: Self-pay | Admitting: *Deleted

## 2022-01-18 ENCOUNTER — Inpatient Hospital Stay (HOSPITAL_COMMUNITY): Payer: No Typology Code available for payment source

## 2022-01-18 DIAGNOSIS — J690 Pneumonitis due to inhalation of food and vomit: Secondary | ICD-10-CM

## 2022-01-18 DIAGNOSIS — J9601 Acute respiratory failure with hypoxia: Secondary | ICD-10-CM | POA: Diagnosis not present

## 2022-01-18 DIAGNOSIS — I639 Cerebral infarction, unspecified: Secondary | ICD-10-CM | POA: Diagnosis not present

## 2022-01-18 DIAGNOSIS — N179 Acute kidney failure, unspecified: Secondary | ICD-10-CM | POA: Diagnosis not present

## 2022-01-18 DIAGNOSIS — E1059 Type 1 diabetes mellitus with other circulatory complications: Secondary | ICD-10-CM

## 2022-01-18 DIAGNOSIS — I6521 Occlusion and stenosis of right carotid artery: Secondary | ICD-10-CM | POA: Diagnosis not present

## 2022-01-18 DIAGNOSIS — M86172 Other acute osteomyelitis, left ankle and foot: Secondary | ICD-10-CM

## 2022-01-18 DIAGNOSIS — Z6839 Body mass index (BMI) 39.0-39.9, adult: Secondary | ICD-10-CM

## 2022-01-18 DIAGNOSIS — N184 Chronic kidney disease, stage 4 (severe): Secondary | ICD-10-CM

## 2022-01-18 DIAGNOSIS — E6609 Other obesity due to excess calories: Secondary | ICD-10-CM

## 2022-01-18 DIAGNOSIS — Z720 Tobacco use: Secondary | ICD-10-CM

## 2022-01-18 HISTORY — PX: IR ANGIO VERTEBRAL SEL SUBCLAVIAN INNOMINATE UNI L MOD SED: IMG5364

## 2022-01-18 LAB — BASIC METABOLIC PANEL
Anion gap: 13 (ref 5–15)
BUN: 78 mg/dL — ABNORMAL HIGH (ref 6–20)
CO2: 27 mmol/L (ref 22–32)
Calcium: 8 mg/dL — ABNORMAL LOW (ref 8.9–10.3)
Chloride: 96 mmol/L — ABNORMAL LOW (ref 98–111)
Creatinine, Ser: 2.14 mg/dL — ABNORMAL HIGH (ref 0.44–1.00)
GFR, Estimated: 29 mL/min — ABNORMAL LOW (ref >60)
Glucose, Bld: 188 mg/dL — ABNORMAL HIGH (ref 70–99)
Potassium: 4 mmol/L (ref 3.5–5.1)
Sodium: 136 mmol/L (ref 135–145)

## 2022-01-18 LAB — PROTEIN S ACTIVITY: Protein S Activity: 77 % (ref 63–140)

## 2022-01-18 LAB — PTT-LA MIX: PTT-LA Mix: 65.4 s — ABNORMAL HIGH (ref 0.0–40.5)

## 2022-01-18 LAB — CBC
HCT: 23.4 % — ABNORMAL LOW (ref 36.0–46.0)
Hemoglobin: 7.6 g/dL — ABNORMAL LOW (ref 12.0–15.0)
MCH: 30 pg (ref 26.0–34.0)
MCHC: 32.5 g/dL (ref 30.0–36.0)
MCV: 92.5 fL (ref 80.0–100.0)
Platelets: 408 10*3/uL — ABNORMAL HIGH (ref 150–400)
RBC: 2.53 MIL/uL — ABNORMAL LOW (ref 3.87–5.11)
RDW: 13.7 % (ref 11.5–15.5)
WBC: 14.2 10*3/uL — ABNORMAL HIGH (ref 4.0–10.5)
nRBC: 0.2 % (ref 0.0–0.2)

## 2022-01-18 LAB — HEPARIN LEVEL (UNFRACTIONATED): Heparin Unfractionated: 0.38 IU/mL (ref 0.30–0.70)

## 2022-01-18 LAB — DRVVT CONFIRM: dRVVT Confirm: 1.3 ratio — ABNORMAL HIGH (ref 0.8–1.2)

## 2022-01-18 LAB — LUPUS ANTICOAGULANT PANEL
DRVVT: 77.7 s — ABNORMAL HIGH (ref 0.0–47.0)
PTT Lupus Anticoagulant: 74.3 s — ABNORMAL HIGH (ref 0.0–43.5)

## 2022-01-18 LAB — CULTURE, BAL-QUANTITATIVE W GRAM STAIN: Culture: 1000 — AB

## 2022-01-18 LAB — HEXAGONAL PHASE PHOSPHOLIPID: Hexagonal Phase Phospholipid: 9 s (ref 0–11)

## 2022-01-18 LAB — MAGNESIUM: Magnesium: 2.7 mg/dL — ABNORMAL HIGH (ref 1.7–2.4)

## 2022-01-18 LAB — GLUCOSE, CAPILLARY
Glucose-Capillary: 187 mg/dL — ABNORMAL HIGH (ref 70–99)
Glucose-Capillary: 123 mg/dL — ABNORMAL HIGH (ref 70–99)
Glucose-Capillary: 126 mg/dL — ABNORMAL HIGH (ref 70–99)
Glucose-Capillary: 214 mg/dL — ABNORMAL HIGH (ref 70–99)
Glucose-Capillary: 232 mg/dL — ABNORMAL HIGH (ref 70–99)

## 2022-01-18 LAB — PROTEIN C ACTIVITY: Protein C Activity: 99 % (ref 73–180)

## 2022-01-18 LAB — DRVVT MIX: dRVVT Mix: 47.5 s — ABNORMAL HIGH (ref 0.0–40.4)

## 2022-01-18 LAB — PHOSPHORUS: Phosphorus: 4.8 mg/dL — ABNORMAL HIGH (ref 2.5–4.6)

## 2022-01-18 MED ORDER — METRONIDAZOLE 500 MG PO TABS
500.0000 mg | ORAL_TABLET | Freq: Two times a day (BID) | ORAL | Status: DC
  Administered 2022-01-18 – 2022-01-20 (×6): 500 mg
  Filled 2022-01-18 (×7): qty 1

## 2022-01-18 MED ORDER — CEFADROXIL 500 MG PO CAPS
500.0000 mg | ORAL_CAPSULE | Freq: Two times a day (BID) | ORAL | Status: DC
  Administered 2022-01-18 – 2022-01-20 (×6): 500 mg
  Filled 2022-01-18 (×8): qty 1

## 2022-01-18 MED ORDER — INSULIN DETEMIR 100 UNIT/ML ~~LOC~~ SOLN
20.0000 [IU] | Freq: Two times a day (BID) | SUBCUTANEOUS | Status: DC
  Administered 2022-01-18 – 2022-01-20 (×6): 20 [IU] via SUBCUTANEOUS
  Filled 2022-01-18 (×8): qty 0.2

## 2022-01-18 MED ORDER — FREE WATER
150.0000 mL | Status: DC
  Administered 2022-01-18 – 2022-01-22 (×21): 150 mL

## 2022-01-18 NOTE — Progress Notes (Signed)
[facility] for heparin Indication: arterial clot/ischemic arm s/p revas by vascular  Allergies  Allergen Reactions   Wellbutrin [Bupropion] Hives   Cefepime Rash    Tolerates penicilllin   Ciprofloxacin Hcl Hives and Rash    Hives/rash at injection site; 01/15/22 tolerated IV cipro   Tape Rash    Patient Measurements: Height: 5\' 5"  (165.1 cm) Weight: 84.6 kg (186 lb 8.2 oz) IBW/kg (Calculated) : 57 Heparin Dosing Weight: 77kg  Vital Signs: Temp: 98.1 F (36.7 C) (09/18 0800) Temp Source: Axillary (09/18 0800) BP: 126/82 (09/18 0830) Pulse Rate: 98 (09/18 0830)  Labs: Recent Labs    01/15/22 1241 01/15/22 1622 01/16/22 0403 01/17/22 0544 01/17/22 1620 01/18/22 0346  HGB  --    < > 7.0* 7.1*  --  7.6*  HCT  --    < > 22.0* 22.4*  --  23.4*  PLT  --   --  309 335  --  408*  APTT 49*  --  66* 51*  --   --   HEPARINUNFRC 0.28*   < > 0.32 0.20* 0.51 0.38  CREATININE  --   --  2.44* 2.41*  --  2.14*   < > = values in this interval not displayed.     Estimated Creatinine Clearance: 37.9 mL/min (A) (by C-G formula based on SCr of 2.14 mg/dL (H)).   Assessment: 20 yoF admitted with R MCA vertebral stroke s/p thrombectomy and stent placement. Pt was on apixaban prior to admit, last dose 9/8 am. Pt with elevated troponins and now chest pain, pharmacy asked to dose IV heparin for ACS management. Will defer bolus and utilize lower goal in setting of recent CVA.  Heparin level 0.38 units/mL on heparin 1550 units/hr which is therapeutic. No issues with administration or bleeding per chart review.  Goal of Therapy:  Heparin level 0.3-0.5 units/ml Monitor platelets by anticoagulation protocol: Yes   Plan:  Cont heparin 1550 units/hr Monitor daily heparin level, CBC Monitor for signs/symptoms of bleeding   Erskine Speed, PharmD Clinical Pharmacist 01/18/2022, 9:56 AM

## 2022-01-18 NOTE — Progress Notes (Signed)
Nutrition Follow-up  DOCUMENTATION CODES:   Not applicable  INTERVENTION:  Continue tube feeding via Cortrak tube: Glucerna 1.5 at goal rate of 55 ml/h (1320 ml per day) Prosource TF20 60 ml daily   Provides 2060 kcal, 128 gm protein, 1003 ml free water daily  NUTRITION DIAGNOSIS:   Inadequate oral intake related to inability to eat as evidenced by NPO status.  Ongoing  GOAL:   Patient will meet greater than or equal to 90% of their needs  Goal met via TF  MONITOR:   Diet advancement, TF tolerance  REASON FOR ASSESSMENT:   Consult Enteral/tube feeding initiation and management  ASSESSMENT:   Pt with PMH of CAD with MI, CHF with preserved EF, HTN, uncontrolled DM, strokes, R arm ischemia, CKD stage IV, PVD, L foot DM ulcer s/p amputation of 5th toe 08/2021 with concern of osteomyelitis of 4th toe admitted with acute R MCA stroke s/p thrombectomy, R ICA dissection repair, and stent placement.  9/15 re-intubated d/t pulmonary edema 9/15 s/p L fourth metatarsal bone biopsy, results pending  9/18 extubated  Pt sitting up in bed at time of visit. RN at bedside. Pt responds with thumbs up as she continues with aphasia. She is feeling well. Tolerating TF at goal rate with no nausea or emesis. No further complaints or needs at this time.   ST following for swallow evaluation.   Admit weight: 91 kg Current weight: 84.6 kg  Edema: mild pitting generalized, non-pitting BUE  Medications: colace, lasix, SSI 0-20 units q4h, levemir 20 units BID, protonix, miralax IV drips: cipro, vanc  Labs: BUN 78, Cr 2.14, Pho 4.8 (H), Mg 2.7 (H), GFR 29, CBG's 123-220 x24 hours  Diet Order:   Diet Order             Diet NPO time specified  Diet effective now                   EDUCATION NEEDS:   No education needs have been identified at this time  Skin:  Skin Assessment: Reviewed RN Assessment  Last BM:  9/17 (type 7 via FMS)  Height:   Ht Readings from Last 1  Encounters:  01/13/22 _0  (1.651 m)    Weight:   Wt Readings from Last 1 Encounters:  01/18/22 84.6 kg   BMI:  Body mass index is 31.04 kg/m.  Estimated Nutritional Needs:   Kcal:  2000-2200  Protein:  105-120g  Fluid:  2 L/day  Clayborne Dana, RDN, LDN Clinical Nutrition

## 2022-01-18 NOTE — Progress Notes (Signed)
OT Cancellation Note  Patient Details Name: Lauren [name] MRN: [id number] DOB: 03-30-1981   Cancelled Treatment:    Reason Eval/Treat Not Completed: Medical issues which prohibited therapy (Per RN, pt may get extubated today. OT to f/u when appropriate.)  [name] 01/18/2022, 8:39 AM

## 2022-01-18 NOTE — Patient Instructions (Signed)
Visit Information  Thank you for taking time to visit with me today. Please don't hesitate to contact me if I can be of assistance to you.   Following are the goals we discussed today:   Goals Addressed               This Visit's Progress     Patient Stated     Manage diabetes and poor wound healing  [facility]) (pt-stated)   On track     Care Coordination Interventions: Admission for CVA 01/08/22  Podiatry recommends ID consult.  Consult discussed with Dr. Burna Cash on 9/13 -Wound care per podiatry orders> recommend Betadine with dry dressings as needed. -Continuing Inpatient foot wound treatments by podiatry and infectious disease      seek help for coverage & financial resources [facility]) (pt-stated)   Not on track     11/24/21  Care Coordination Interventions: Evaluation of current treatment plan related to medical coverage, food insecurity and financial resources and patient's adherence to plan as established by provider Collaborated with IP and/or [facility] SW regarding financial & coverage resources Review of EPIC chart - Noted possible admission to [place] next appointment is  pending possible CIR admission  on N/A at N/A  Please call the care guide team at [phone] if you need to cancel or reschedule your appointment.   If you are experiencing a Mental Health or [place] or need someone to talk to, please call the Suicide and Crisis Lifeline: 988 call the [place] National Suicide Prevention Lifeline: 7243300229 or TTY: (937)471-3080 TTY 850-451-6797) to talk to a trained counselor call 1-800-273-TALK (toll free, 24 hour hotline) call the [facility]: [phone] call 911   Patient verbalizes understanding of instructions and care plan provided today and agrees to view in [place]. Active MyChart status and patient understanding of how to access instructions and care plan via MyChart confirmed with patient.     The patient has been  provided with contact information for the care management team and has been advised to call with any health related questions or concerns.   Loraine Lavina Hamman, RN, BSN, [place] Coordinator Office number [phone]

## 2022-01-18 NOTE — Progress Notes (Addendum)
Rounding Note    Patient Name: Lauren [name] Date of Encounter: 01/18/2022  [place] Cardiologist: Carlyle Dolly, MD   Subjective   Extubated earlier today and now on 4L of O2 via nasal cannula.  She has residual expressive aphasia but responds to questions appropriately by shaking her head and follows commands. She denies any chest pain or feelings of shortness of breath. She does complain of pain in her right foot.  Inpatient Medications    Scheduled Meds:  arformoterol  15 mcg Nebulization BID   atorvastatin  40 mg Per Tube Daily   budesonide (PULMICORT) nebulizer solution  0.5 mg Nebulization BID   Chlorhexidine Gluconate Cloth  6 each Topical Daily   docusate  100 mg Per Tube BID   free water  300 mL Per Tube Q4H   furosemide  80 mg Intravenous BID   insulin aspart  0-20 Units Subcutaneous Q4H   insulin detemir  20 Units Subcutaneous BID   isosorbide mononitrate  20 mg Per Tube BID   levothyroxine  75 mcg Per Tube Q0600   metoprolol tartrate  25 mg Per Tube BID   nicotine  21 mg Transdermal Daily   mouth rinse  15 mL Mouth Rinse Q2H   pantoprazole  40 mg Per Tube Daily   polyethylene glycol  17 g Per Tube Daily   revefenacin  175 mcg Nebulization Daily   ticagrelor  90 mg Oral BID   Or   ticagrelor  90 mg Per Tube BID   Continuous Infusions:  ciprofloxacin Stopped (01/17/22 1719)   dexmedetomidine (PRECEDEX) IV infusion Stopped (01/15/22 1408)   feeding supplement (GLUCERNA 1.5 CAL) 55 mL/hr at 01/18/22 0800   heparin 1,550 Units/hr (01/18/22 0800)   norepinephrine (LEVOPHED) Adult infusion Stopped (01/16/22 0026)   vancomycin Stopped (01/17/22 1329)   PRN Meds: acetaminophen **OR** acetaminophen (TYLENOL) oral liquid 160 mg/5 mL **OR** acetaminophen, docusate sodium, influenza vac split quadrivalent PF, ipratropium-albuterol, labetalol, mouth rinse, pneumococcal 20-valent conjugate vaccine, polyethylene glycol, senna-docusate   Vital Signs     Vitals:   01/18/22 0735 01/18/22 0736 01/18/22 0800 01/18/22 0830  BP:   126/82 126/82  Pulse:   82 98  Resp:   18 18  Temp:   98.1 F (36.7 C)   TempSrc:   Axillary   SpO2: 98% 98% 98% 100%  Weight:      Height:        Intake/Output Summary (Last 24 hours) at 01/18/2022 1045 Last data filed at 01/18/2022 0800 Gross per 24 hour  Intake 2418.98 ml  Output 3650 ml  Net -1231.02 ml      01/18/2022    4:07 AM 01/16/2022    5:00 AM 01/15/2022    5:00 AM  Last 3 Weights  Weight (lbs) 186 lb 8.2 oz 191 lb 2.2 oz 195 lb 5.2 oz  Weight (kg) 84.6 kg 86.7 kg 88.6 kg      Telemetry    Normal sinus rhythm with rates mostly in the 70s to 80s. One very brief episode of a narrow complex irregular tachycardia that lasted about 20-30 seconds. - Personally Reviewed  ECG    No new ECG tracing today. - Personally Reviewed  Physical Exam   GEN: No acute distress.   Neck: No JVD appreciated with patient sitting upright. Cardiac: RRR. No murmurs, rubs, or gallops. Radial pulses 2+ and equal bilaterally. Respiratory: No increased work of breathing. Decreased breath sounds in bilateral bases but no significant wheezes, rhonchi,  or rales, appreciated. GI: Soft, non-distended, and non-tender. MS: No lower extremity edema. No deformity. Neuro:  Aphasic with left sided facial droop with no movement of left upper extremity.  Labs    High Sensitivity Troponin:   Recent Labs  Lab 01/08/22 1920 01/11/22 1001 01/12/22 0027 01/12/22 0203 01/13/22 1059  TROPONINIHS 860* 3,869* 3,807* 3,512* 2,880*     Chemistry Recent Labs  Lab 01/15/22 6283 01/15/22 1622 01/16/22 0403 01/17/22 0544 01/18/22 0346  NA 144   < > 142 136 136  K 4.2   < > 3.6 4.3 4.0  CL 109  --  104 99 96*  CO2 25  --  26 25 27   GLUCOSE 124*  --  124* 353* 188*  BUN 53*  --  57* 75* 78*  CREATININE 2.19*  --  2.44* 2.41* 2.14*  CALCIUM 8.2*  --  7.9* 7.8* 8.0*  MG 2.4  --  2.6*  --  2.7*  ALBUMIN  --   --  <1.5*  --    --   GFRNONAA 29*  --  25* 26* 29*  ANIONGAP 10  --  12 12 13    < > = values in this interval not displayed.    Lipids  Recent Labs  Lab 01/16/22 0403  TRIG 197*    Hematology Recent Labs  Lab 01/16/22 0403 01/17/22 0544 01/18/22 0346  WBC 18.8* 14.6* 14.2*  RBC 2.31* 2.36* 2.53*  HGB 7.0* 7.1* 7.6*  HCT 22.0* 22.4* 23.4*  MCV 95.2 94.9 92.5  MCH 30.3 30.1 30.0  MCHC 31.8 31.7 32.5  RDW 14.5 14.1 13.7  PLT 309 335 408*   Thyroid No results for input(s): "TSH", "FREET4" in the last 168 hours.  BNPNo results for input(s): "BNP", "PROBNP" in the last 168 hours.  DDimer No results for input(s): "DDIMER" in the last 168 hours.   Radiology    DG CHEST PORT 1 VIEW  Result Date: 01/18/2022 CLINICAL DATA:  Ventilator patient. EXAM: PORTABLE CHEST 1 VIEW COMPARISON:  Prior chest radiographs, most recently done [id number]. CT 11/09/2008. FINDINGS: 0538 hours. Endotracheal tube, left IJ central venous catheter and feeding tube appear unchanged in position. The heart size and mediastinal contours are stable. Mild residual interstitial opacities are present in both lung bases, little changed from yesterday, but improved from 3 days ago. No evidence of pneumothorax or significant pleural effusion. Telemetry leads overlie the chest. IMPRESSION: Stable residual interstitial opacities at both lung bases. Unchanged support system. Electronically Signed   By: Richardean Sale M.D.   On: 01/18/2022 08:27   DG CHEST PORT 1 VIEW  Result Date: 01/17/2022 CLINICAL DATA:  Follow-up exam.  Patient on ventilator. EXAM: PORTABLE CHEST 1 VIEW COMPARISON:  01/15/2022 and older studies. FINDINGS: Bilateral interstitial and patchy airspace lung opacities have improved. No new lung abnormalities. No convincing pleural effusion and no pneumothorax. Endotracheal tube, left internal jugular central venous line and enteric tube are stable. IMPRESSION: 1. Significant interval improvement in lung aeration.  Electronically Signed   By: Lajean Manes M.D.   On: 01/17/2022 10:10    Cardiac Studies   Echocardiogram with Bubble Study 01/10/2022: Impressions:  1. There is akinesis of the mid-to-apical septal LV segments, all apical  segments, and apex. There is significant thinning of the mid-to-apical  septal segments and apex likely represents scarring/infarct. The  basal-to-mid inferior and basal septal  segments are hypokinetic.   2. No LV thrombus visualized on definity imaging.   3. Left ventricular  ejection fraction, by estimation, is 30 to 35%. The  left ventricle has moderately decreased function. The left ventricle  demonstrates regional wall motion abnormalities (see scoring  diagram/findings for description). The left  ventricular internal cavity size was mildly-to-moderately dilated. There  is mild concentric left ventricular hypertrophy. Indeterminate diastolic  filling due to E-A fusion.   4. Right ventricular systolic function is normal. The right ventricular  size is normal.   5. The mitral valve is normal in structure. Trivial mitral valve  regurgitation.   6. The aortic valve is tricuspid. Aortic valve regurgitation is not  visualized. No aortic stenosis is present.   7. The inferior vena cava is dilated in size with >50% respiratory  variability, suggesting right atrial pressure of 8 mmHg.   8. Agitated saline contrast bubble study was negative, with no evidence  of any interatrial shunt.   Comparison(s): Compared to prior TTE on 10/2021, the EF has dropped to  30-35% with similar wall motion.  _______________  Complete Echocardiogram 01/14/2022: Impressions: 1. Left ventricular ejection fraction, by estimation, is 35 to 40%. Left  ventricular ejection fraction by 2D MOD biplane is 37.1 %. The left  ventricle has moderately decreased function. The left ventricle  demonstrates regional wall motion abnormalities  (see scoring diagram/findings for description). There is  mild left  ventricular hypertrophy. Left ventricular diastolic parameters are  consistent with Grade II diastolic dysfunction (pseudonormalization).  Elevated left ventricular end-diastolic pressure.  The E/e' is 36. Wall motion abnormality suggestive of LAD territory  infarct or less likely Takatsubo cardiomyopathy.   2. Right ventricular systolic function is normal. The right ventricular  size is normal. Tricuspid regurgitation signal is inadequate for assessing  PA pressure.   3. The mitral valve is abnormal. Trivial mitral valve regurgitation.   4. The inferior vena cava is normal in size with <50% respiratory  variability, suggesting right atrial pressure of 8 mmHg.   5. The aortic valve is tricuspid. Aortic valve regurgitation is not  visualized.   Comparison(s): Changes from prior study are noted. 01/10/2022: LVEF 30-35%,  mid to apical ventricular wall thinning and severe hypokinesis to  akinesis. _______________  ABIs/TBIs 01/14/2022: Summary:  Right: Resting right ankle-brachial index indicates noncompressible right  lower extremity arteries. The right toe-brachial index is abnormal.   Left: Resting left ankle-brachial index indicates noncompressible left  lower extremity arteries. Although ankle-brachial indices are within mild  (0.80-0.94) range, patient history of non-compressible arteries on  previous exams coupled with abnormal,  monophasic waveforms suggests progression of arterial disease.  Right ABIs appear decreased compared to prior study on 08-23-2021. Left  ABIs appear decreased compared to prior study on 08-23-2021.   Patient Profile     41 y.o. 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 She went immediately to IR for thrombectomy.  Cardiology was consulted on 01/11/2022 for further evaluation of EKG changes and elevated troponin.  Assessment & Plan    NSTEMI vs Demand Ischemia CAD History of moderate to severe disease on cardiac catheterization in 2015 which was treated medically. Patient presented with an acute stroke and was found to  have elevated troponin. High-sensitivity troponin peaked at 3,869. Echo shows LVEF of 30-30% with multiple wall motion abnormalities. - No chest pain. - Continue IV Heparin for recently upper extremity arterial occlusion. - Continue Brilinta 90mg  twice daily.  - Continue Lopressor 25mg  twice daily. - Continue Imdur 20mg  twice daily. - Continue Lipitor 40mg  daily. - This may be demand ischemia from acute stroke and CHF vs true ACS. Would continue to treat medically for now and can then consider ischemic evaluation as an outpatient once she recovers from her stroke.  Acute on Chronic Combined CHF Initial Echo on 9/8 showed LVEF of 30-35% with akinesis of the mid to apical septa LV segments, all apical segments, and apex as well as significant thinning of the mid to apical septal segments and apex likely representing scarring/infarct. Repeat Echo on 9/14 showed LVEF of 35-40% with hypokinesis of the mid and distal anterior wall, mid and distal lateral wall, mid and distal anterior septum, entire apex, mid and distal inferior wall, mid anterolateral segment, and mid inferoseptal segment. Being diuresed with IV Lasix - increased to 80mg  twice daily yesterday. Documented urinary output of 4L yesterday but still net positive 1.63 L this admission. Weight down 5 lbs since yesterday. Creatinine slightly improved to 2.14 today. - Does not appear significantly volume overloaded on exam but still has decreased  breath sounds in bilateral bases. - Continue IV Lasix 80mg  twice daily today. - Continue Lopressor 25mg  twice daily. Would transition to Toprol-XL prior to discharge. - Continue Imdur 20mg  daily twice daily. - NO ACEi/ARB/ARNI or MRA given renal function. - Will not initiate SGLT2 inhibitor at this time given renal function and GFR <30. - Continue to monitor daily weights, strict I/Os, and renal function.  - Wall motion abnormalities consistent with LAD distribution vs  stress-induced cardiomyopathy. Will continue to manage medically at this time. Can consider ischemic evaluation as an outpatient once patient has recovered from acute stroke.  Hypertension BP mostly well controlled.  - Continue Lopressor and Imdur as above.  Hyperlipidemia Lipid panel this admission: Total Cholesterol 85, Triglycerides 154, HDL 15, LDL 39. - Continue Lipitor 40mg  daily.  Type 1 Diabetes Mellitus - Management per primary team.  Acute on CKD stage IV Baseline creatinine around 2.0 to 2.5.  Creatinine 2.15 on admission but peaked at 4.54 on 9/10. Improving the last few days with diuresis. - Continue to monitor closely.  Right Brachial Artery Occlusion S/p thrombectomy in 10/2021. - On Eliquis prior to admission. Currently on IV Heparin.  Acute Stroke Admitted with acute stroke with right ICA occlusion s/p thrombectomy. - On Eliquis prior to admission. Currently on IV Heparin and Brilinta. - Continue statin. - Hypercoagulability work-up pending. - Management per Neurology.  Osteomyelitis of Left Foot - Podiatry and ID following.  Acute on Chronic Normocytic Anemia S/p a total of 3 units of PRBCs this admission, most recently 9/10. Felt to be multifactorial.  - Hemoglobin stable at 7.6 today. - Management per primary team.    For questions or updates, please contact [place] Please consult [email or web address] for contact info under        Signed, Darreld Mclean, PA-C  01/18/2022,  10:45 AM    I have seen and examined the patient along with Darreld Mclean, PA-C .  I have reviewed the chart, notes and new data.  I agree with PA/NP's note.  Key new complaints: She is extubated, awake and alert, but not speaking.  Seems to have right-sided gaze  preference.  Does appear to comprehend spoken words and follow instructions. Key examination changes: No peripheral edema or jugular venous distention, but has diminished breath sounds in both bases due to pleural effusions. Key new findings / data: Improving creatinine, back to her usual baseline, although BUN still high.  Normal electrolytes.  PLAN: She has had a small demand myocardial infarction but left ventricular systolic function is unchanged. We will continue to follow along to decide when we can transition to oral diuretics, but no plan for additional cardiac evaluation until she has had a chance to recover from her stroke and until we clarify the reason for recurrent problems with anemia requiring transfusion.  In the absence of a presentation more consistent with classic atherothrombotic acute coronary syndrome, avoid contrast based procedures in the setting of advanced chronic kidney disease.  Sanda Klein, MD, [place] [phone] 01/18/2022, 4:51 PM

## 2022-01-18 NOTE — Progress Notes (Signed)
PT Cancellation Note  Patient Details Name: Lauren [name] MRN: [id number] DOB: 20-Apr-1981   Cancelled Treatment:    Reason Eval/Treat Not Completed: Other (comment) Per RN, pt awaiting extubation this morning, asked PT to return later in afternoon for treatment.   Lauren [name], PT, DPT   Acute Rehabilitation Department   Sandra Cockayne 01/18/2022, 8:34 AM

## 2022-01-18 NOTE — Procedures (Signed)
Extubation Procedure Note  Patient Details:   Name: Lauren [name] DOB: [birth year] MRN: [id number]   Airway Documentation:    Vent end date: 01/18/22 Vent end time: 0830   Evaluation  O2 sats: stable throughout Complications: No apparent complications Patient did tolerate procedure well. Bilateral Breath Sounds: Clear, Diminished   Yes  RT extubated patient to 4L [place] per MD order with RN at bedside. Positive cuff leak noted. Patient tolerated well. No stridor noted at this time. RT will continue to monitor as needed.   Fabiola Backer 01/18/2022, 8:37 AM

## 2022-01-18 NOTE — Progress Notes (Signed)
Occupational Therapy Treatment Patient Details Name: Lauren [name] MRN: [id number] DOB: 20-Sep-1980 Today's Date: 01/18/2022   History of present illness 41 y/o female presenting 9/8 with gait instability, L-sided facial droop, weakness, and slurred speech. 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 Hospitalization complicated by URI, Rhinovirus positive, acute COPD exacerbation, elevated troponin, possibly due to cardiac injury or Takotsubo cardiomyopathy.  L 4th ray bon biopsy 01/16/22, concern for osteomyelitis. 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   OT comments  Vani was seen with PT for safe progression towards her goals. Upon arrival, pts L foot was saturated with blood through dressing, RN notified and addressed. Pt tolerated LUE PROM, arm passively brought to midline with cues for midline gaze and attention to LUE. Pt demonstrated better sustained attention to therapists and command following this date. Unable to facilitate visually crossing midline this date. Overall she was mod A for bed mobility and mod A +2 for sit<>stand at the bedside to assist with linen change. Pt will continues to benefit from OT acutely, POC remains appropriate.    Recommendations for follow up therapy are one component of a multi-disciplinary discharge planning process, led by the attending physician.  Recommendations may be updated based on patient status, additional functional criteria and insurance authorization.    Follow Up Recommendations  Acute inpatient rehab (3hours/day)    Assistance Recommended at Discharge Frequent or constant Supervision/Assistance  Patient can return home with the following  A lot of help with walking  and/or transfers;A lot of help with bathing/dressing/bathroom;Two people to help with walking and/or transfers;Two people to help with bathing/dressing/bathroom;Assistance with cooking/housework;Assistance with feeding;Direct supervision/assist for medications management;Direct supervision/assist for financial management;Assist for transportation;Help with stairs or ramp for entrance   Equipment Recommendations  Other (comment)    Recommendations for Other Services Rehab consult    Precautions / Restrictions Precautions Precautions: Fall Precaution Comments: L inattention, R gaze preference, L hemiplegia Restrictions Weight Bearing Restrictions: No       Mobility Bed Mobility Overal bed mobility: Needs Assistance Bed Mobility: Sidelying to Sit, Sit to Supine, Rolling Rolling: Mod assist Sidelying to sit: Mod assist, +2 for physical assistance   Sit to supine: Max assist, +2 for physical assistance, +2 for safety/equipment   General bed mobility comments: mod A for cues and management of L hemibody, assist to bring hips to EOB. min-max A for sitting balance    Transfers Overall transfer level: Needs assistance Equipment used: 2 person hand held assist Transfers: Sit to/from Stand Sit to Stand: +2 physical assistance, +2 safety/equipment, Mod assist           General transfer comment: mod A +2 to come to static standing x2     Balance Overall balance assessment: Needs assistance Sitting-balance support: Feet supported, Single extremity supported Sitting balance-Leahy Scale: Poor     Standing balance support: Bilateral upper extremity supported Standing balance-Leahy Scale: Poor                             ADL either performed or assessed with clinical judgement   ADL Overall ADL's : Needs assistance/impaired  Lower Body Dressing: Total assistance;Bed level               Functional mobility during ADLs: Maximal  assistance;+2 for physical assistance;+2 for safety/equipment General ADL Comments: bed level ROM for L hemi body, transferred to sitting with min-max A sitting balance and stood EOB x2    Extremity/Trunk Assessment Upper Extremity Assessment Upper Extremity Assessment: LUE deficits/detail RUE Deficits / Details: no active movement noted; presynergy; edematous L hand RUE Sensation: decreased light touch;decreased proprioception RUE Coordination: decreased gross motor;decreased fine motor LUE Deficits / Details: Moving through full ROM, strength is seemingly WFL. Difficul tto fully assess due to impaired cognition and communication LUE Sensation: WNL LUE Coordination: decreased fine motor;decreased gross motor   Lower Extremity Assessment Lower Extremity Assessment: Defer to PT evaluation        Vision   Vision Assessment?: Vision impaired- to be further tested in functional context;Yes Eye Alignment: Within Functional Limits Ocular Range of Motion: Impaired-to be further tested in functional context Additional Comments: R gaze preference; able to sustian visual attention briefly to midline   Perception Perception Perception: Impaired   Praxis Praxis Praxis: Not tested    Cognition Arousal/Alertness: Awake/alert Behavior During Therapy: Restless, Flat affect Overall Cognitive Status: Difficult to assess Area of Impairment: Attention, Safety/judgement, Following commands, Problem solving, Awareness                   Current Attention Level: Sustained   Following Commands: Follows one step commands with increased time Safety/Judgement: Decreased awareness of safety, Decreased awareness of deficits   Problem Solving: Slow processing, Decreased initiation, Difficulty sequencing, Requires verbal cues, Requires tactile cues General Comments: good sustained attention to therapist, able to communicate with yes and no andn some gestures. follows simple commands to assist with  functional movement        Exercises Exercises: Other exercises Other Exercises Other Exercises: LUE hand, wrist, eblow and shoulder PROM through all available planes    Shoulder Instructions       General Comments SpO2 90-97% on 2L. Pts L foot saturated with bloood upon arrival. RN notified    Pertinent Vitals/ Pain       Pain Assessment Pain Assessment: Faces Faces Pain Scale: Hurts a little bit Pain Location: L foot, chest Pain Descriptors / Indicators: Discomfort, Grimacing Pain Intervention(s): Limited activity within patient's tolerance, Monitored during session  Home Living                                          Prior Functioning/Environment              Frequency  Min 2X/week        Progress Toward Goals  OT Goals(current goals can now be found in the care plan section)  Progress towards OT goals: Progressing toward goals  Acute Rehab OT Goals Patient Stated Goal: unable to state OT Goal Formulation: With patient Time For Goal Achievement: 01/25/22 Potential to Achieve Goals: Good ADL Goals Pt Will Perform Grooming: with set-up;with min guard assist;sitting Pt Will Perform Upper Body Dressing: with min assist;sitting Pt Will Transfer to Toilet: with mod assist;with +2 assist;stand pivot transfer;bedside commode Additional ADL Goal #1: pt will follow 100% of simple 1 step commands with minimal cues to participate in functional tasks Additional ADL Goal #2: Pt will locate at least 3 grooming items in her L environment twith  moderate cues to initiate grooming task  Plan Discharge plan remains appropriate    Co-evaluation    PT/OT/SLP Co-Evaluation/Treatment: Yes Reason for Co-Treatment: Complexity of the patient's impairments (multi-system involvement);For patient/therapist safety;To address functional/ADL transfers   OT goals addressed during session: ADL's and self-care      AM-PAC OT "6 Clicks" Daily Activity     Outcome  Measure   Help from another person eating meals?: Total Help from another person taking care of personal grooming?: A Lot Help from another person toileting, which includes using toliet, bedpan, or urinal?: Total Help from another person bathing (including washing, rinsing, drying)?: A Lot Help from another person to put on and taking off regular upper body clothing?: A Lot Help from another person to put on and taking off regular lower body clothing?: Total 6 Click Score: 9    End of Session Equipment Utilized During Treatment: Oxygen;Gait belt  OT Visit Diagnosis: Unsteadiness on feet (R26.81);Other abnormalities of gait and mobility (R26.89);Muscle weakness (generalized) (M62.81);Hemiplegia and hemiparesis Hemiplegia - Right/Left: Left Hemiplegia - dominant/non-dominant: Dominant Hemiplegia - caused by: Cerebral infarction   Activity Tolerance Patient tolerated treatment well   Patient Left with call bell/phone within reach;in chair;with chair alarm set;with family/visitor present   Nurse Communication Mobility status        Time: 1448-1856 OT Time Calculation (min): 35 min  Charges: OT General Charges $OT Visit: 1 Visit OT Treatments $Self Care/Home Management : 8-22 mins    [name] 01/18/2022, 6:10 PM

## 2022-01-18 NOTE — Discharge Instructions (Addendum)
[place]: Food Distribution: -[facility] [phone]  (M?F; 9am-4:15pm) -[facility] [phone]  (W&Th; 39:30am-12pm) -[employer] [phone]  [phone] of Every Mnth; 9am-10:30am) -[facility] [phone]  (Every Other Th; 9am-12pm) Meal Programs: -Crisis and Emergency   Assistance/Food Stamps (DSS) [phone] -Food and Nutrition Services   (Food Stamps) (DSS) [phone] -LOT [phone] -[facility] "[facility]" [phone] -[address], [place], [place] [phone]

## 2022-01-18 NOTE — Progress Notes (Signed)
[place] for heparin Indication: chest pain/ACS  Allergies  Allergen Reactions   Wellbutrin [Bupropion] Hives   Cefepime Rash    Tolerates penicilllin   Ciprofloxacin Hcl Hives and Rash    Hives/rash at injection site; 01/15/22 tolerated IV cipro   Tape Rash    Patient Measurements: Height: 5\' 5"  (165.1 cm) Weight: 84.6 kg (186 lb 8.2 oz) IBW/kg (Calculated) : 57 Heparin Dosing Weight: 77kg  Vital Signs: Temp: 97.7 F (36.5 C) (09/18 0400) Temp Source: Oral (09/18 0400) BP: 109/70 (09/18 0400) Pulse Rate: 73 (09/18 0400)  Labs: Recent Labs    01/15/22 0714 01/15/22 1241 01/15/22 1622 01/16/22 0403 01/17/22 0544 01/17/22 1620 01/18/22 0346  HGB 7.5*  --    < > 7.0* 7.1*  --  7.6*  HCT 23.2*  --    < > 22.0* 22.4*  --  23.4*  PLT 344  --   --  309 335  --  408*  APTT 64* 49*  --  66* 51*  --   --   HEPARINUNFRC 0.Lauren 0.28*   < > 0.32 0.20* 0.51 0.38  CREATININE 2.19*  --   --  2.44* 2.41*  --   --    < > = values in this interval not displayed.     Estimated Creatinine Clearance: 33.6 mL/min (A) (by C-G formula based on SCr of 2.41 mg/dL (H)).   Assessment: Lauren [name] admitted with R MCA vertebral stroke s/p thrombectomy and stent placement. Pt was on apixaban prior to admit, last dose 9/8 am. Pt with elevated troponins and now chest pain, pharmacy asked to dose IV heparin for ACS management. Will defer bolus and utilize lower goal in setting of recent CVA.  Had bone biopsy 9/16 but heparin was continued. Increase slightly given labile levels on same rates.   HL 0.38, cont current rate   Goal of Therapy:  Heparin level 0.3-0.5 units/ml Monitor platelets by anticoagulation protocol: Yes   Plan:  Cont heparin 1550 units/hr Monitor daily heparin level, CBC Monitor for signs/symptoms of bleeding   Alanda Slim, PharmD, [facility] Clinical Pharmacist Please see AMION for all Pharmacists' Contact Phone Numbers 01/18/2022, 4:48 AM

## 2022-01-18 NOTE — Progress Notes (Addendum)
STROKE TEAM PROGRESS NOTE   INTERVAL HISTORY Significant other at the bedside. She was extubated this morning and is tolerating [place] well. Following commands, but not speaking  CBC:  Recent Labs  Lab 01/17/22 0544 01/18/22 0346  WBC 14.6* 14.2*  HGB 7.1* 7.6*  HCT 22.4* 23.4*  MCV 94.9 92.5  PLT 335 408*    Basic Metabolic Panel:  Recent Labs  Lab 01/16/22 0403 01/17/22 0544 01/18/22 0346  NA 142 136 136  K 3.6 4.3 4.0  CL 104 99 96*  CO2 26 25 27   GLUCOSE 124* 353* 188*  BUN 57* 75* 78*  CREATININE 2.44* 2.41* 2.14*  CALCIUM 7.9* 7.8* 8.0*  MG 2.6*  --  2.7*  PHOS 4.6  --  4.8*    Lipid Panel:  Recent Labs  Lab 01/16/22 0403  TRIG 197*    HgbA1c:  No results for input(s): "HGBA1C" in the last 168 hours.  Urine Drug Screen:  No results for input(s): "LABOPIA", "COCAINSCRNUR", "LABBENZ", "AMPHETMU", "THCU", "LABBARB" in the last 168 hours.  Alcohol Level  No results for input(s): "ETH" in the last 168 hours.   IMAGING past 24 hours DG CHEST PORT 1 VIEW  Result Date: 01/18/2022 CLINICAL DATA:  Ventilator patient. EXAM: PORTABLE CHEST 1 VIEW COMPARISON:  Prior chest radiographs, most recently done [id number]. CT 11/09/2008. FINDINGS: 0538 hours. Endotracheal tube, left IJ central venous catheter and feeding tube appear unchanged in position. The heart size and mediastinal contours are stable. Mild residual interstitial opacities are present in both lung bases, little changed from yesterday, but improved from 3 days ago. No evidence of pneumothorax or significant pleural effusion. Telemetry leads overlie the chest. IMPRESSION: Stable residual interstitial opacities at both lung bases. Unchanged support system. Electronically Signed   By: Richardean Sale M.D.   On: 01/18/2022 08:27    PHYSICAL EXAM  Temp:  [97.3 F (36.3 C)-98.4 F (36.9 C)] 98.2 F (36.8 C) (09/18 1154) Pulse Rate:  [68-98] 77 (09/18 1300) Resp:  [12-32] 16 (09/18 1300) BP: (98-144)/(70-91)  112/70 (09/18 1300) SpO2:  [93 %-100 %] 100 % (09/18 1300) FiO2 (%):  [30 %-40 %] 30 % (09/18 0736) Weight:  [84.6 kg] 84.6 kg (09/18 0407)  General - Well nourished, well developed, intubated on sedation.  Ophthalmologic - fundi not visualized due to noncooperation.  Cardiovascular - Regular rhythm and rate.  Musculoskeletal - left foot with blood tinged dressing post bone biopsy  Neuro -Eyes open to voice, able to follow simple peripheral commands, and stick out her tongue. Eyes more right gaze preference but still not have complete right gaze, not cross midline, blinking to visual threat on the right but not on the left, PERRL. Corneal reflex present, gag and cough present. Left facial droop.  Tongue is midline.   RUE 4/5 with no drift  RLE 3/5 on pain.  LLE slight movement on pain but no movement of LUE.  Sensation, coordination not cooperative and gait not tested.   ASSESSMENT/PLAN [name] is a 41 y.o. 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 NIHSS of 8. Patient not a  TPA candidate, underwent IR thrombectomy of R ICA  with R ICA dissection repair and stent placement with TICI 3  Amaurosis fugax for 2 weeks Stroke:  Acute Right MCA patchy infarcts and punctate left MCA/ACA infarct with right ICA occlusion s/p IR with TICI 3 but right ICA dissection s/p telescope stent placement, etiology unclear, likely large vessel disease, cannot completely rule out cardioembolic source CT head Periventricular white matter hypodensities adjacent to the frontal horn of the  right lateral ventricles and in the right corona radiata are new since prior exam. This may represent acute ischemia. CTA head & neck - Age indeterminate occlusion of the proximal right ICA in the neck with non opacification distally in the upper neck and intracranially. Right MCA and A1 ACA are patent, but diminutive. Severe left paraclinoid ICA stenosis. CT perfusion, no evidence of core infarct or penumbra; however, there is approximately 38 mL of T-max greater than 4 seconds in the right MCA territory which suggests possible oligemia. IR with TICI3 of intracranial R ICA, however complicated by ICA dissection status post telescope stents Post IR CT - Redemonstrated hypodensity in the right frontal WM MRI x 2 Extensive patchy areas of acute infarction involving the right MCA territory, most confluent areas along the watershed. MRA  Asymmetric diffused decreased size of right MCA branch vessels compared to the left likely reflects decreased perfusion.  2D Echo EF 30-35%, akinesis of mid to apical septal LV segments, all apical segments and apex.  LDL 39 HgbA1c 8.0 Hypercoag work-up pending VTE prophylaxis -heparin IV Eliquis (apixaban) daily prior to admission, now on heparin IV and Brilinta (ticagrelor) 90 mg bid.  Therapy recommendations: CIR Disposition:  pending  History of stroke 12/2019 left face and arm numbness.  MRI showed right midbrain infarct.  MRI bilateral siphon atherosclerosis.  EF 55 to 60%.  Carotid Doppler negative.  LDL 84.9, A1c 10.3, UDS negative.  Discharged on DAPT.  Acute hypoxic respiratory Failure managed by CCM Extubated 9/10->bipap->reintubated 9/15 -> extubated 9/18 Tolerating well so far Per ID, on vancomycin and Cipro  Chronic Combined HF CAD/MI 2D echo EF 30-35%, decreased LV function, bubble study negative Cardiology on board On metoprolol and Imdur and lasix 60->80 bid On Brilinta and heparin IV  AKI on CKD  On tube feeding and free  water Worsening Cr 2.47-2.48-2.19-2.44->2.41->2.14 Renal following  Osteomyelitis, left foot from diabetic ulcer  On augmentin at home chronically  Blood culture so far negative ID on board, now on vancomycin and Cipro podiatry on board, status post bone biopsy 9/16  Ischemic limb 10/2021 right upper extremity ischemia Status post thrombectomy On Eliquis PTA Now on heparin IV  Anemia  Hgb 8.2->PRBC 9/10-> 8.7-7.5-> PRBC 9/14-> 11.6-> 7.0->7.1->7.6 Continue brilinta and heparin IV D/c ASA CBC monitoring Transfer if hemoglobin less than 7  Leukocytosis Improving 26.5-19.4-17.3-22.0-22.6-27.2-18.8->14.6->14.2 On vancomycin and Cipro as above  ID on board  Dysphagia  ST on board- Ice chips only NG in place On tube feeding @ 55 and free water 300->150 Q4  Hypertension Hypotension Home meds:  hydralazine, metoprolol, isosorbide Stable on the low end Off Levophed On metoprolol and imdur and lasix Long-term BP goal normotensive  Hyperlipidemia Home meds:  fenofibrate and Lipitor 40 LDL 39, goal < 70 Resumed atorvastatin 40 Continue at discharge   Diabetes type II UnControlled Metabolic Acidosis  DKA Home meds:  insulin HgbA1c 8.0, goal < 7.0 CBGs SSI Levemir 15 U bid  Tobacco abuse Current smoker Smoking cessation counseling will be provided Nicotine  patch provided  Other Stroke Risk Factors Obesity, Body mass index is 31.04 kg/m., BMI >/= 30 associated with increased stroke risk, recommend weight loss, diet and exercise as appropriate  Hx stroke/TIA  Other Active Problems Hypothyroidism, on home Synthroid   Hospital day # 10  Patient seen and examined by NP/APP with MD. MD to update note as needed.   Janine Ores, DNP, FNP-BC [facility] Pager: [phone]  ATTENDING NOTE: I reviewed above note and agree with the assessment and plan. Pt was seen and examined.   Husband at bedside.  Patient was extubated yesterday, tolerating  well.  Lying in bed, still has expressive aphasia, nonverbal, however able to follow peripheral commands, able to stick out her tongue but did not close eyes as requested.  Still has right gaze preference, left hemiplegia.  Continued on heparin IV, Brilinta and statin.  Sodium 136, decrease free water.  Close monitoring BP and glucose.  PT/OT recommend CIR  For detailed assessment and plan, please refer to above/below as I have made changes wherever appropriate.   Rosalin Hawking, MD PhD Stroke Neurology 01/18/2022 6:59 PM  This patient is critically ill due to large stroke, ischemic limb, sepsis, AKI, leukocytosis, anemia and at significant risk of neurological worsening, death form recurrent stroke, hemorrhagic transformation, septic shock, renal failure, heart failure. This patient's care requires constant monitoring of vital signs, hemodynamics, respiratory and cardiac monitoring, review of multiple databases, neurological assessment, discussion with family, other specialists and medical decision making of high complexity. I spent 35 minutes of neurocritical care time in the care of this patient. I had long discussion with husband at bedside, updated pt current condition, treatment plan and potential prognosis, and answered all the questions.  He expressed understanding and appreciation.

## 2022-01-18 NOTE — Progress Notes (Addendum)
NAME:  Lauren [name], MRN:  [id number], DOB:  1981-04-19, LOS: 51 ADMISSION DATE:  01/08/2022, CONSULTATION DATE:  01/08/2022 REFERRING MD:  Dr Curly Shores, CHIEF COMPLAINT:  Stroke   History of Present Illness:  41 y/o F with known CAD, HFrEF, DM I c/b neuropathy, stage III CKD, peripheral vascular disease admitted with R MCA stroke and vertebral artery stroke.   Patient presented to the ED 9/8 with left sided facial droop, left sided weakness, and slurred speech.  LKW: unknown.  Code stroke initiated. NIHSS: 8.  Imaging revealed: age indeterminate occlusion of proximal right ICA, V3 segment of right vertebral artery, left paraclinoid ICA stenosis. No core infarct or penumbra. Patient went immediately to IR for thrombectomy.  Of note, patient with recent arterial thrombectomy of the right upper extremity due to limb ischemia.  She has had URI x 2 days with recent sick contact.   COVID negative but Rhinovirus positive. Extubated in ICU post procedure.    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  NIHSS 8, to IR  for thrombectomy, intubated.  MRSA PCR +, Rhinovirus + 9/10 PSV wean, moving RUE/RLE, extubated  9/11 ECHO>, CT head negative for acute changes, Trach asp negative,  9/13 started on HFNC 10L overnight 9/14 tmax 101.2, remains on HFNC, MRI, Heme/onc and ID consult  Interim History / Subjective:    Objective   Blood pressure 116/76, pulse 74, temperature 97.7 F (36.5 C), temperature source Oral, resp. rate (Abnormal) 28, height 5\' 5"  (1.651 m), weight 84.6 kg, last menstrual period 11/01/2021, SpO2 97 %.    Vent  Mode: PRVC FiO2 (%):  [30 %-40 %] 30 % Set Rate:  [28 bmp] 28 bmp Vt Set:  [450 mL] 450 mL PEEP:  [5 cmH20] 5 cmH20 Pressure Support:  [8 cmH20-10 cmH20] 8 cmH20 Plateau Pressure:  [17 cmH20-20 cmH20] 20 cmH20   Intake/Output Summary (Last 24 hours) at 01/18/2022 0355 Last data filed at 01/18/2022 0600 Gross per 24 hour  Intake 2796.33 ml  Output 4000 ml  Net -1203.67 ml   Filed Weights   01/15/22 0500 01/16/22 0500 01/18/22 0407  Weight: 88.6 kg 86.7 kg 84.6 kg   Examination:  General: 41 year old female resting in bed she is currently in no acute distress tolerating pressure support trial HEENT normocephalic atraumatic orally intubated does have copious oral secretions.  Bedside cuff leak check was present. Pulmonary: Some scattered rhonchi.  On spontaneous breathing trial her tidal volumes average 4 50-500 I see no accessory use PCXR: ETT looks good. R>L airspace disease w/ improved aeration adjusting for penetration. Has right sided elevated HD  Cardiac: Regular rate and rhythm without audible murmur rub or gallop Abdomen: Soft not tender does have loose stools with a Flexi-Seal in place Extremities: Dependent edema primarily on the left side Neuro: Awake, follows commands, tries to communicate.  Does have left-sided weakness/hemiparesis seemingly some degree of left-sided neglect GU: Clear yellow. 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 Plan Extubate, as she has passed spontaneous breathing trial Continuing antibiotics as directed by infectious disease, these are covering for both pneumonia and osteomyelitis Continue IV diuresis N.p.o. Aspiration reflux precautions Pulse oximetry  Right MCA and right vertebral stroke s/p thrombectomy, right ICA dissection repair, stent placement Hx R Hand Ischemia s/p Thrombectomy 10/2021 Dysphagia  Plan: Cont asa and Brilinta Serial neuro exams PT/OT/SLP as appropriate   AKI on CKD IV Possible Contrast Induced Nephropathy  Cr tolerating diruesis Plan: Cont lasix Am chem  Strict I&O Renal dose meds   Chronic Systolic & Diastolic CHF  CAD  Troponin elevation, NSTEMI Chest Discomfort  HTN - repeat Echo 9/14 showing EF w/ EF 35-40%, G2DD, mid to apical ventricular wall thinning and severe hypokinesis to akinesis Plan: Cont Imdur,metoprolol, statin, asa, Brilinta and IV heparin.  Cont IV lasix  Hypothyroidism  Plan  Synthroid   Normocytic Anemia Multifactorial in setting of blood loss, chronic disease. 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  Plan  Podiatry recs: Betadine and dry dressing as needed Cipro and Vanc (per ID started 9/15)  Intermittent fluid & electrolyte imbalance Total Body fluid Volume overload.  I&O balance improving. +1.4 Liters Plan daily BMP w/ diuresis  Na stable; cont current Free water replacement.  Cont current lasix rx as long as BMP and BP support  DM I Plan Cont resistant SSI for goal 140-180 Inc levemir to 20 units q 12   At  Risk Malnutrition  Plan tubefeeds  Prior RUE arterial occlusion 10/2021 s/p thrombectomy on Eliquis - no hx of DVT/ PE.  Concern for hypercoagulability given events on Eliquis.   Hypercoagulable work-up pending Plan Cont IV heparin  Out pt Hypercoagulable w/o and consider Heme outpt eval     Best Practice (right click and "Reselect all SmartList Selections" daily)  Diet/type: tubefeeds DVT prophylaxis: systemic heparin GI prophylaxis: PPI Lines: N/A Foley:  N/A Code Status:  full code Last date of multidisciplinary goals of care discussion: 9/11. Husband updated at bedside 9/15  My cct 86 min  Erick Colace ACNP-BC [place] Pager # [phone] OR # [phone] if no answer

## 2022-01-18 NOTE — Progress Notes (Signed)
SLP Cancellation Note  Patient Details Name: Lauren [name] MRN: [id number] DOB: 01/14/1981   Cancelled treatment:       Reason Eval/Treat Not Completed: Medical issues which prohibited therapy (Pt is currently re-intubated. SLP will follow up on a subsequent date.)  Judie Hollick I. Hardin Negus, [place], [place] Office number [phone]  Horton Marshall 01/18/2022, 8:15 AM

## 2022-01-18 NOTE — Progress Notes (Signed)
  Inpatient Rehabilitation Admissions Coordinator   I met at bedside with patient and her spouse. I await further therapy progress to assist with pursuing a possible CIR admit.  Danne Baxter, RN, MSN Rehab Admissions Coordinator [phone] 01/18/2022 12:42 PM

## 2022-01-18 NOTE — Progress Notes (Signed)
    Subjective: Patient's status post bedside bone biopsy performed 01/16/2022 to assist with antibiotic coverage of osteomyelitis to the left foot managed by infectious disease.  Patient is awake this morning upon evaluation with family and nurse present.  Presenting for dressing changes  Objective: No active bleeding of the bone biopsy sites.  All sites are stable and clean.  There is some mild maceration of the skin throughout the foot secondary to the bleeding bandages  Assessment/Plan: Bone biopsy LT fourth metatarsal 01/16/2022 -Dressings changed.  Clean dry and intact.  We will plan to see later this week for dressing change -Bone biopsy pending.  Currently no growth < 24 hours -Continue antibiotics as per infectious disease  Edrick Kins 01/18/2022, 9:00 AM

## 2022-01-18 NOTE — Patient Outreach (Signed)
  Care Coordination   Chart review/coordination  Visit Note   01/18/2022 Name: Lauren [name] MRN: [id number] DOB: 1980-12-09  Lauren [name] is a 41 y.o. year old female who sees Lemmie Evens, MD for primary care. I  reviewed patient chart for possible discharge. Noting patient is being reviewed for possible CIR stay  What matters to the patients health and wellness today?  Remains Inpatient since 01/08/22     Goals Addressed               This Visit's Progress     Patient Stated     Manage diabetes and poor wound healing  [facility]) (pt-stated)   On track     Care Coordination Interventions: Admission for CVA 01/08/22  Podiatry recommends ID consult.  Consult discussed with Dr. Burna Cash on 9/13 -Wound care per podiatry orders> recommend Betadine with dry dressings as needed. -Continuing Inpatient foot wound treatments by podiatry and infectious disease      seek help for coverage & financial resources [facility]) (pt-stated)   Not on track     11/24/21  Care Coordination Interventions: Evaluation of current treatment plan related to medical coverage, food insecurity and financial resources and patient's adherence to plan as established by provider Collaborated with IP and/or [facility] SW regarding financial & coverage resources Review of EPIC chart - Noted possible admission to CIR         SDOH assessments and interventions completed:  No     Care Coordination Interventions Activated:  Yes  Care Coordination Interventions:  Yes, provided   Follow up plan:  will follow up with chart review and check on status of SW referral within 7-10 business days    Encounter Outcome:  Pt. Visit Completed   Lauren Meadow L. Lavina Hamman, RN, BSN, [place] Coordinator Office number [phone]

## 2022-01-18 NOTE — Progress Notes (Signed)
Physical Therapy Treatment Patient Details Name: Lauren [name] MRN: [id number] DOB: 1980-11-04 Today's Date: 01/18/2022   History of Present Illness 41 y/o female presenting 9/8 with gait instability, L-sided facial droop, weakness, and slurred speech. 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 Hospitalization complicated by URI, Rhinovirus positive, acute COPD exacerbation, elevated troponin, possibly due to cardiac injury or Takotsubo cardiomyopathy. L 4th ray bone biopsy 01/16/22, concern for osteomyelitis. Intubated 9/15, extubated 9/18. 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    PT Comments    The pt was agreeable to session, continues to need significant assistance to complete bed mobility and sit-stand transfers due to weakness and inattention in left extremities. Pt also limited at this time by significant bleeding through dressing on LLE and therefore attempts were made to limit wt bearing through L foot. The pt will continue to benefit from skilled PT to progress functional endurance, strength, and stability for seated and standing activities. Continue to recommend acute inpatient rehab when medically stable for d/c.     Recommendations for follow up therapy are one component of a multi-disciplinary discharge planning process, led by the attending physician.  Recommendations may be updated based on patient status, additional functional criteria and insurance authorization.  Follow Up Recommendations  Acute inpatient rehab (3hours/day)     Assistance Recommended at Discharge Frequent or constant Supervision/Assistance  Patient can return home with the following Assist for transportation;Assistance with  cooking/housework;Two people to help with walking and/or transfers;A lot of help with bathing/dressing/bathroom;Help with stairs or ramp for entrance   Equipment Recommendations  Other (comment) (defer to post acute)    Recommendations for Other Services       Precautions / Restrictions Precautions Precautions: Fall Precaution Comments: L inattention, R gaze preference, L hemiplegia Restrictions Weight Bearing Restrictions: No     Mobility  Bed Mobility Overal bed mobility: Needs Assistance Bed Mobility: Sidelying to Sit, Sit to Supine, Rolling Rolling: Mod assist Sidelying to sit: Mod assist, +2 for physical assistance   Sit to supine: Max assist, +2 for physical assistance, +2 for safety/equipment   General bed mobility comments: mod A for cues and management of L hemibody, assist to bring hips to EOB. min-max A for sitting balance    Transfers Overall transfer level: Needs assistance Equipment used: 2 person hand held assist Transfers: Sit to/from Stand Sit to Stand: +2 physical assistance, +2 safety/equipment, Mod assist           General transfer comment: mod A +2 to come to static standing x2    Ambulation/Gait               General Gait Details: deferred at this time due to bleeding through LLE      Balance Overall balance assessment: Needs assistance Sitting-balance support: Feet supported, Single extremity supported Sitting balance-Leahy Scale: Poor     Standing balance support: Bilateral upper extremity supported Standing balance-Leahy Scale: Poor                              Cognition Arousal/Alertness: Awake/alert Behavior During Therapy: Restless, Flat affect Overall Cognitive Status: Difficult to assess Area of Impairment: Attention, Safety/judgement, Following commands, Problem solving, Awareness  Current Attention Level: Sustained   Following Commands: Follows one step commands with increased  time Safety/Judgement: Decreased awareness of safety, Decreased awareness of deficits   Problem Solving: Slow processing, Decreased initiation, Difficulty sequencing, Requires verbal cues, Requires tactile cues General Comments: good sustained attention to therapist, able to communicate with yes and no andn some gestures. follows simple commands to assist with functional movement        Exercises Other Exercises Other Exercises: LUE hand, wrist, eblow and shoulder PROM through all available planes    General Comments General comments (skin integrity, edema, etc.): pt on 4L O2 upon arrival, SpO2 100%, SpO2 90-97% on 2L. Pts L foot saturated with bloood upon arrival. RN notified      Pertinent Vitals/Pain Pain Assessment Pain Assessment: Faces Faces Pain Scale: Hurts a little bit Pain Location: L foot, chest Pain Descriptors / Indicators: Discomfort, Grimacing Pain Intervention(s): Monitored during session, Limited activity within patient's tolerance, Repositioned     PT Goals (current goals can now be found in the care plan section) Acute Rehab PT Goals Patient Stated Goal: to return home PT Goal Formulation: With family Time For Goal Achievement: 01/25/22 Potential to Achieve Goals: Good Progress towards PT goals: Progressing toward goals    Frequency    Min 4X/week      PT Plan Current plan remains appropriate    Co-evaluation PT/OT/SLP Co-Evaluation/Treatment: Yes Reason for Co-Treatment: Complexity of the patient's impairments (multi-system involvement);Necessary to address cognition/behavior during functional activity;For patient/therapist safety;To address functional/ADL transfers PT goals addressed during session: Mobility/safety with mobility;Balance;Strengthening/ROM OT goals addressed during session: ADL's and self-care      AM-PAC PT "6 Clicks" Mobility   Outcome Measure  Help needed turning from your back to your side while in a flat bed without using  bedrails?: A Lot Help needed moving from lying on your back to sitting on the side of a flat bed without using bedrails?: Total Help needed moving to and from a bed to a chair (including a wheelchair)?: Total Help needed standing up from a chair using your arms (e.g., wheelchair or bedside chair)?: Total Help needed to walk in hospital room?: Total Help needed climbing 3-5 steps with a railing? : Total 6 Click Score: 7    End of Session Equipment Utilized During Treatment: Gait belt;Oxygen Activity Tolerance: Patient limited by fatigue Patient left: in bed;with call bell/phone within reach;with bed alarm set Nurse Communication: Mobility status PT Visit Diagnosis: Muscle weakness (generalized) (M62.81);Hemiplegia and hemiparesis;Other abnormalities of gait and mobility (R26.89) Hemiplegia - Right/Left: Left Hemiplegia - dominant/non-dominant: Non-dominant Hemiplegia - caused by: Cerebral infarction     Time: 8242-3536 PT Time Calculation (min) (ACUTE ONLY): 33 min  Charges:  $Therapeutic Activity: 8-22 mins                     [place] Car[place], PT, DPT   Acute Rehabilitation Department   Sandra Cockayne 01/18/2022, 6:36 PM

## 2022-01-19 ENCOUNTER — Inpatient Hospital Stay (HOSPITAL_COMMUNITY): Payer: No Typology Code available for payment source

## 2022-01-19 DIAGNOSIS — I639 Cerebral infarction, unspecified: Secondary | ICD-10-CM | POA: Diagnosis not present

## 2022-01-19 DIAGNOSIS — M86172 Other acute osteomyelitis, left ankle and foot: Secondary | ICD-10-CM | POA: Diagnosis not present

## 2022-01-19 DIAGNOSIS — I6521 Occlusion and stenosis of right carotid artery: Secondary | ICD-10-CM | POA: Diagnosis not present

## 2022-01-19 DIAGNOSIS — N179 Acute kidney failure, unspecified: Secondary | ICD-10-CM | POA: Diagnosis not present

## 2022-01-19 DIAGNOSIS — I5020 Unspecified systolic (congestive) heart failure: Secondary | ICD-10-CM | POA: Diagnosis not present

## 2022-01-19 DIAGNOSIS — J9601 Acute respiratory failure with hypoxia: Secondary | ICD-10-CM | POA: Diagnosis not present

## 2022-01-19 LAB — BASIC METABOLIC PANEL
Anion gap: 11 (ref 5–15)
BUN: 77 mg/dL — ABNORMAL HIGH (ref 6–20)
CO2: 33 mmol/L — ABNORMAL HIGH (ref 22–32)
Calcium: 8.5 mg/dL — ABNORMAL LOW (ref 8.9–10.3)
Chloride: 94 mmol/L — ABNORMAL LOW (ref 98–111)
Creatinine, Ser: 2.14 mg/dL — ABNORMAL HIGH (ref 0.44–1.00)
GFR, Estimated: 29 mL/min — ABNORMAL LOW (ref >60)
Glucose, Bld: 172 mg/dL — ABNORMAL HIGH (ref 70–99)
Potassium: 4 mmol/L (ref 3.5–5.1)
Sodium: 138 mmol/L (ref 135–145)

## 2022-01-19 LAB — CBC
HCT: 25.2 % — ABNORMAL LOW (ref 36.0–46.0)
Hemoglobin: 8 g/dL — ABNORMAL LOW (ref 12.0–15.0)
MCH: 29.2 pg (ref 26.0–34.0)
MCHC: 31.7 g/dL (ref 30.0–36.0)
MCV: 92 fL (ref 80.0–100.0)
Platelets: 506 10*3/uL — ABNORMAL HIGH (ref 150–400)
RBC: 2.74 MIL/uL — ABNORMAL LOW (ref 3.87–5.11)
RDW: 13.8 % (ref 11.5–15.5)
WBC: 19 10*3/uL — ABNORMAL HIGH (ref 4.0–10.5)
nRBC: 0.2 % (ref 0.0–0.2)

## 2022-01-19 LAB — GLUCOSE, CAPILLARY
Glucose-Capillary: 138 mg/dL — ABNORMAL HIGH (ref 70–99)
Glucose-Capillary: 162 mg/dL — ABNORMAL HIGH (ref 70–99)
Glucose-Capillary: 178 mg/dL — ABNORMAL HIGH (ref 70–99)
Glucose-Capillary: 179 mg/dL — ABNORMAL HIGH (ref 70–99)
Glucose-Capillary: 246 mg/dL — ABNORMAL HIGH (ref 70–99)
Glucose-Capillary: 254 mg/dL — ABNORMAL HIGH (ref 70–99)

## 2022-01-19 LAB — HEPARIN LEVEL (UNFRACTIONATED): Heparin Unfractionated: 0.42 IU/mL (ref 0.30–0.70)

## 2022-01-19 LAB — SURGICAL PATHOLOGY

## 2022-01-19 MED ORDER — INSULIN ASPART 100 UNIT/ML IJ SOLN: 3.0000 [IU] | INTRAMUSCULAR | Status: DC

## 2022-01-19 MED ORDER — POLYETHYLENE GLYCOL 3350 17 G PO PACK: 17.0000 g | PACK | Freq: Every day | ORAL | Status: DC | PRN

## 2022-01-19 MED ORDER — INSULIN ASPART 100 UNIT/ML IJ SOLN: 6.0000 [IU] | INTRAMUSCULAR | Status: DC

## 2022-01-19 MED ORDER — MELATONIN 3 MG PO TABS
3.0000 mg | ORAL_TABLET | Freq: Every day | ORAL | Status: DC
  Administered 2022-01-19 – 2022-01-27 (×9): 3 mg
  Filled 2022-01-19 (×9): qty 1

## 2022-01-19 MED ORDER — INSULIN ASPART 100 UNIT/ML IJ SOLN
3.0000 [IU] | INTRAMUSCULAR | Status: DC
  Administered 2022-01-19 – 2022-01-21 (×12): 3 [IU] via SUBCUTANEOUS

## 2022-01-19 MED ORDER — HYDRALAZINE HCL 25 MG PO TABS
12.5000 mg | ORAL_TABLET | Freq: Three times a day (TID) | ORAL | Status: DC
  Administered 2022-01-19 – 2022-01-20 (×2): 12.5 mg via ORAL
  Filled 2022-01-19 (×2): qty 1

## 2022-01-19 MED ORDER — SENNOSIDES-DOCUSATE SODIUM 8.6-50 MG PO TABS: 1.0000 | ORAL_TABLET | Freq: Every evening | ORAL | Status: DC | PRN

## 2022-01-19 MED ORDER — DOCUSATE SODIUM 50 MG/5ML PO LIQD: 100.0000 mg | Freq: Two times a day (BID) | ORAL | Status: DC | PRN

## 2022-01-19 NOTE — Progress Notes (Signed)
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   Patient presented to the ED 9/8 with left sided facial droop, left sided weakness, and slurred speech.  LKW: unknown.  Code stroke initiated. NIHSS: 8.  Imaging revealed: age indeterminate occlusion of proximal right ICA, V3 segment of right vertebral artery, left paraclinoid ICA stenosis. No core infarct or penumbra. Patient went immediately to IR for thrombectomy.  Of note, patient with recent arterial thrombectomy of the right upper extremity due to limb ischemia.  She has had URI x 2 days with recent sick contact.   COVID negative but Rhinovirus positive. Extubated in ICU post procedure.    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  NIHSS 8, to IR  for thrombectomy, intubated.  MRSA PCR +, Rhinovirus + 9/10 PSV wean, moving RUE/RLE, extubated  9/11 ECHO>, CT head negative for acute changes, Trach asp negative,  9/13 started on HFNC 10L overnight 9/14 tmax 101.2, remains on HFNC, MRI, Heme/onc and ID consult 9/18 extubated, infectious disease change antimicrobial coverage to cefadroxil and Flagyl 9/19: Required Precedex overnight for restlessness.  Off by a.m. rounds  Interim History / Subjective:  Resting comfortably  Objective   Blood pressure 119/87, pulse  86, temperature 98.9 F (37.2 C), temperature source Axillary, resp. rate 17, height 5\' 5"  (1.651 m), weight 80.8 kg, last menstrual period 11/01/2021, SpO2 98 %.    Vent Mode: CPAP;PSV FiO2 (%):  [30 %] 30 % PEEP:  [5 cmH20] 5 cmH20 Pressure Support:  [8 cmH20] 8 cmH20   Intake/Output Summary (Last 24 hours) at 01/19/2022 0725 Last data filed at 01/19/2022 0600 Gross per 24 hour  Intake 1902.41 ml  Output 6050 ml  Net -4147.59 ml   Filed Weights   01/16/22 0500 01/18/22 0407 01/19/22 0500  Weight: 86.7 kg 84.6 kg 80.8 kg   Examination: General 41 year old white female resting in bed currently in no acute distress HEENT normocephalic atraumatic does have facial droop on the left Pulmonary: Clear to auscultation currently 3 L/min oxygen Portable chest x-ray personally reviewed shows continued improvement in aeration with infiltrates on most completely evaluated. Cardiac: Regular rate and rhythm Abdomen soft nontender Extremities warm dry dependent edema brisk cap refill Neuro ongoing left-sided hemiparesis and neglect  Resolved Hospital Problem list   Acute Metabolic Acidosis 2/2 DKA   Assessment & Plan:  Principal Problem:   Acute ischemic stroke [facility]) Active Problems:   Internal carotid artery occlusion, right   Acute respiratory failure with hypoxia (HCC)   Aspiration pneumonia (HCC)   Acute osteomyelitis of left foot (HCC)   CAD (coronary artery disease)   HFrEF (heart failure with reduced ejection fraction) (HCC)   Acute renal failure superimposed on stage 4 chronic kidney disease (HCC)   Type 1 diabetes mellitus with vascular  disease ([place])   Hypothyroidism   Tobacco abuse   Class 2 obesity due to excess calories without serious comorbidity with body mass index (BMI) of 39.0 to 39.9 in adult   Acute Hypoxic Respiratory Failure  Rhinovirus Positive  Acute Exacerbation of Suspected COPD / Bronchospasm  Tobacco Abuse, 1 pack/day She has been intubated now  twice.-Extubated 9/18 Plan Antibiotics as directed by infectious disease Continue IV diuresis N.p.o., aspiration precaution/reflux precaution Pulse oximetry We will wean oxygen.    Right MCA and right vertebral stroke s/p thrombectomy, right ICA dissection repair, stent placement Hx R Hand Ischemia s/p Thrombectomy 10/2021 Dysphagia  Plan: Continue aspirin and Brilinta  Serial neurochecks  OT, and SLP eval as appropriate  We will get her out of bed  AKI on CKD IV Possible Contrast Induced Nephropathy  Cr tolerating diruesis Plan: No change in current diuretic regimen A.m. chemistry  Chronic Systolic & Diastolic CHF  CAD  Troponin elevation, NSTEMI Chest Discomfort  HTN - repeat Echo 9/14 showing EF w/ EF 35-40%, G2DD, mid to apical ventricular wall thinning and severe hypokinesis to akinesis Plan: Continue Imdur, metoprolol, statin, aspirin, and Brilinta    Hypothyroidism  Plan  Continues Synthroid  Normocytic Anemia Multifactorial in setting of blood loss, chronic disease. 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  Plan  Podiatry recs: Betadine and dry dressing as needed Cefadroxil and metronidazole as directed by infectious disease.  Intermittent fluid & electrolyte imbalance Total Body fluid Volume overload.  I&O balance improving. Now negative 2.7 liters Plan Daily BMP w/ diuresis  Replace as needed  DM I Plan Sliding scale insulin, resistant scale, goal 140-180  Continue Levemir 20 units twice daily   At Risk Malnutrition  Plan Tube feeds, eventual SLP eval  Prior RUE arterial occlusion 10/2021 s/p thrombectomy on Eliquis - no hx of DVT/ PE.  Concern for hypercoagulability given events on Eliquis.   Hypercoagulable work-up pending Plan Continuing IV heparin Outpatient hypercoagulable work-up,  would also benefit from hematology eval as an outpatient   Nocturnal restlessness Plan No more Precedex Add melatonin  Best Practice (right click and "Reselect all SmartList Selections" daily)  Diet/type: tubefeeds DVT prophylaxis: systemic heparin GI prophylaxis: PPI Lines: N/A Foley:  N/A Code Status:  full code Last date of multidisciplinary goals of care discussion: 9/11. Husband updated at bedside 9/15  My cct na  Erick Colace ACNP-BC [place] Pager # [phone] OR # [phone] if no answer

## 2022-01-19 NOTE — Progress Notes (Addendum)
Physical Therapy Treatment Patient Details Name: Lauren [name] MRN: [id number] DOB: [birth year] Today's Date: 01/19/2022   History of Present Illness 41 y/o female presenting 9/8 with gait instability, L-sided facial droop, weakness, and slurred speech. 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 Hospitalization complicated by URI, Rhinovirus positive, acute COPD exacerbation, elevated troponin, possibly due to cardiac injury or Takotsubo cardiomyopathy. L 4th ray bone biopsy 01/16/22, concern for osteomyelitis. Intubated 9/15, extubated 9/18. 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    PT Comments    Pt required max assist supine to sit and +2 mod assist sit to supine. She demonstrated poor sitting balance. L inattention and R gaze preference. Flaccid LUE. Minimal active movement noted LLE. Pt grimacing and bringing L foot up to cross over R knee while sitting EOB. Interpreted this as L foot pain in the dependent position. RN communicated that NP verbalized NWB LLE due to excessive bleeding yesterday, therefore, deferred standing trial. Pt supine in bed at end of session.   Recommendations for follow up therapy are one component of a multi-disciplinary discharge planning process, led by the attending physician.  Recommendations may be updated based on patient status, additional functional criteria and insurance authorization.  Follow Up Recommendations  Acute inpatient rehab (3hours/day)     Assistance Recommended at Discharge Frequent or constant Supervision/Assistance  Patient can return home with the following Assist for transportation;Assistance with cooking/housework;Two people to help with walking and/or transfers;A  lot of help with bathing/dressing/bathroom;Help with stairs or ramp for entrance   Equipment Recommendations  Other (comment) (defer to post acute)    Recommendations for Other Services       Precautions / Restrictions Precautions Precautions: Fall;Other (comment) Precaution Comments: L inattention, R gaze preference, L hemiplegia, flexiseal     Mobility  Bed Mobility Overal bed mobility: Needs Assistance Bed Mobility: Supine to Sit, Sit to Supine, Rolling Rolling: Mod assist   Supine to sit: Max assist, HOB elevated Sit to supine: Mod assist, +2 for physical assistance, HOB elevated   General bed mobility comments: assist with LLE and trunk. Use of bed pad to scoot to EOB    Transfers                   General transfer comment: not attempted due to pain and NP relaying to RN to maintain NWB LLE    Ambulation/Gait                   Stairs             Wheelchair Mobility    Modified Rankin (Stroke Patients Only) Modified Rankin (Stroke Patients Only) Pre-Morbid Rankin Score: No symptoms Modified Rankin: Severe disability     Balance Overall balance assessment: Needs assistance Sitting-balance support: Feet supported, Single extremity supported Sitting balance-Leahy Scale: Poor Sitting balance - Comments: min assist to maintain balance EOB                                    Cognition Arousal/Alertness: Awake/alert Behavior During Therapy: Restless, Flat affect Overall Cognitive Status: Difficult to assess Area of Impairment: Attention, Safety/judgement, Following commands, Problem solving, Awareness  Current Attention Level: Sustained   Following Commands: Follows one step commands with increased time Safety/Judgement: Decreased awareness of safety, Decreased awareness of deficits Awareness: Intellectual Problem Solving: Slow processing, Decreased initiation, Difficulty sequencing, Requires  verbal cues, Requires tactile cues General Comments: No verbalizations this session. Appeared disgruntled. Little interaction with therapist.        Exercises      General Comments General comments (skin integrity, edema, etc.): VSS on 2L      Pertinent Vitals/Pain Pain Assessment Pain Assessment: Faces Faces Pain Scale: Hurts little more Pain Location: L foot Pain Descriptors / Indicators: Discomfort, Grimacing, Restless Pain Intervention(s): Monitored during session, Repositioned, Limited activity within patient's tolerance    Home Living                          Prior Function            PT Goals (current goals can now be found in the care plan section) Acute Rehab PT Goals Patient Stated Goal: to return home Progress towards PT goals: Progressing toward goals    Frequency    Min 4X/week      PT Plan Current plan remains appropriate    Co-evaluation              AM-PAC PT "6 Clicks" Mobility   Outcome Measure  Help needed turning from your back to your side while in a flat bed without using bedrails?: A Lot Help needed moving from lying on your back to sitting on the side of a flat bed without using bedrails?: Total Help needed moving to and from a bed to a chair (including a wheelchair)?: Total Help needed standing up from a chair using your arms (e.g., wheelchair or bedside chair)?: Total Help needed to walk in hospital room?: Total Help needed climbing 3-5 steps with a railing? : Total 6 Click Score: 7    End of Session Equipment Utilized During Treatment: Oxygen Activity Tolerance: Patient limited by pain Patient left: in bed;with call bell/phone within reach;with bed alarm set Nurse Communication: Mobility status PT Visit Diagnosis: Muscle weakness (generalized) (M62.81);Hemiplegia and hemiparesis;Other abnormalities of gait and mobility (R26.89) Hemiplegia - Right/Left: Left Hemiplegia - dominant/non-dominant:  Non-dominant Hemiplegia - caused by: Cerebral infarction     Time: 1607-3710 PT Time Calculation (min) (ACUTE ONLY): 26 min  Charges:  $Therapeutic Activity: 23-37 mins                     Lorrin Goodell, PT  Office # [phone] Pager [phone]    [name] 01/19/2022, 12:48 PM

## 2022-01-19 NOTE — Progress Notes (Signed)
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)   Hypothyroidism   Acute renal failure superimposed on stage 4 chronic kidney disease (HCC)   Tobacco abuse   Class 2 obesity due to excess calories without serious comorbidity with body mass index (BMI) of 39.0 to 39.9 in adult   CAD (coronary artery disease)   Acute osteomyelitis of left foot (HCC)   Internal carotid artery occlusion, right   Acute respiratory failure with hypoxia (HCC)   HFrEF (heart failure with reduced ejection fraction) (HCC)   Aspiration pneumonia [facility])          Assessment:   41 year old female with CAD, heart failure, diabetes with neuropathy, CKD, left foot diabetic ulcer status post fifth ray amputation admitted for right MCA stroke and right vertebral stroke status post thombectomy, ID engaged for left foot osteomyelitis   #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  Given good source control on 5/3 antibiotics stopped on 5/5.  Since that time she has been on multiple courses of Augmentin.  On review looks like she has been taking Augmentin pretty consistently since May. MR showed osteomyelitis distal fourth metatarsal with likely early osteomyelitis of the adjacent proximal phalanx -she was placed back on Augmentin.  Infectious disease was engaged for further recommendations.Transitioned to cipro+vanc due to suspicion of pneumonia(HAP).  -9/8 blood Cx remain negative.   -Bone Bx with podiatry on 9/16 with NG Recommendations: -D/C ciprofloxacin and vancomycin(BAL Cx negative see below) -Start cefadroxil and metro while awaiting Cx and path to return. If path is negative, stop  antibiotics. -ID will sign off if path is not back prior to discharge, please engage ID to set up and appointment.      #Acute respiratory failure SP bronch on 9/15 #AKI on CKD -Pt was SHOB this AM -CXR showed new extensive opacities through both lungs could be infection, pulmonary edema or ARDS of combination. -Pt was being diuresed and antibiotics escalated. BAL Cx with staph epi(contaminant), as such will stop antibiotics for possible PNA. Suspect respiratory distress was due to vol       #Fever-resolved #Aphasic - Tmax of 101.2 on 9/13 may have been due to an aspiration event. Remains afebrile  -Blood cultures NG   Microbiology:   Antibiotics: Ciprofloxacin and metro     SUBJECTIVE: Aphasic, resting in bed. Husband at bedside.  Review of Systems: Review of Systems  All other systems reviewed and are negative.    Scheduled Meds:  arformoterol  15 mcg Nebulization BID   atorvastatin  40 mg Per Tube Daily   budesonide (PULMICORT) nebulizer solution  0.5 mg Nebulization BID   cefadroxil  500 mg Per Tube BID   Chlorhexidine Gluconate Cloth  6 each Topical Daily   docusate  100 mg Per Tube BID   free water  150 mL Per Tube Q4H   furosemide  80 mg Intravenous BID   insulin aspart  0-20 Units Subcutaneous Q4H   insulin detemir  20 Units Subcutaneous BID   isosorbide mononitrate  20 mg Per Tube BID   levothyroxine  75 mcg Per Tube Q0600   metoprolol tartrate  25  mg Per Tube BID   metroNIDAZOLE  500 mg Per Tube Q12H   nicotine  21 mg Transdermal Daily   mouth rinse  15 mL Mouth Rinse Q2H   pantoprazole  40 mg Per Tube Daily   revefenacin  175 mcg Nebulization Daily   ticagrelor  90 mg Oral BID   Or   ticagrelor  90 mg Per Tube BID   Continuous Infusions:  dexmedetomidine (PRECEDEX) IV infusion 0.2 mcg/kg/hr (01/19/22 0400)   feeding supplement (GLUCERNA 1.5 CAL) 55 mL/hr at 01/19/22 0400   heparin 1,550 Units/hr (01/19/22 0400)   norepinephrine (LEVOPHED) Adult  infusion Stopped (01/16/22 0026)   PRN Meds:.acetaminophen **OR** acetaminophen (TYLENOL) oral liquid 160 mg/5 mL **OR** acetaminophen, docusate sodium, influenza vac split quadrivalent PF, ipratropium-albuterol, labetalol, mouth rinse, pneumococcal 20-valent conjugate vaccine, polyethylene glycol, senna-docusate Allergies  Allergen Reactions   Wellbutrin [Bupropion] Hives   Cefepime Rash    Tolerates penicilllin   Ciprofloxacin Hcl Hives and Rash    Hives/rash at injection site; 01/15/22 tolerated IV cipro   Tape Rash    OBJECTIVE: Vitals:   01/19/22 0100 01/19/22 0200 01/19/22 0300 01/19/22 0400  BP: 98/71 99/69 116/78 117/79  Pulse: 76 73 78 79  Resp: 20 (!) _0 Temp:      TempSrc:      SpO2: 100% 100% 96% 97%  Weight:      Height:       Body mass index is 31.04 kg/m.  Physical Exam Constitutional:      Appearance: Normal appearance.  HENT:     Head: Normocephalic and atraumatic.     Right Ear: Tympanic membrane normal.     Left Ear: Tympanic membrane normal.     Nose: Nose normal.     Mouth/Throat:     Mouth: Mucous membranes are moist.  Eyes:     Extraocular Movements: Extraocular movements intact.     Conjunctiva/sclera: Conjunctivae normal.     Pupils: Pupils are equal, round, and reactive to light.  Cardiovascular:     Rate and Rhythm: Normal rate and regular rhythm.     Heart sounds: No murmur heard.    No friction rub. No gallop.  Pulmonary:     Effort: Pulmonary effort is normal.     Breath sounds: Normal breath sounds.  Abdominal:     General: Abdomen is flat.     Palpations: Abdomen is soft.  Skin:    General: Skin is warm and dry.       Lab Results Lab Results  Component Value Date   WBC 19.0 (H) 01/19/2022   HGB 8.0 (L) 01/19/2022   HCT 25.2 (L) 01/19/2022   MCV 92.0 01/19/2022   PLT 506 (H) 01/19/2022    Lab Results  Component Value Date   CREATININE 2.14 (H) 01/19/2022   BUN 77 (H) 01/19/2022   NA 138 01/19/2022   K 4.0  01/19/2022   CL 94 (L) 01/19/2022   CO2 33 (H) 01/19/2022    Lab Results  Component Value Date   ALT 19 01/08/2022   AST 34 01/08/2022   ALKPHOS 67 01/08/2022   BILITOT 0.5 01/08/2022        Laurice Record, [facility] 01/19/2022, 5:21 AM

## 2022-01-19 NOTE — Progress Notes (Signed)
Speech Language Pathology Treatment: Dysphagia  Patient Details Name: Gwenette Wellons MRN: [id number] DOB: 08/29/1980 Today's Date: 01/19/2022 Time: 0881-1031 SLP Time Calculation (min) (ACUTE ONLY): 20 min  Assessment / Plan / Recommendation Clinical Impression  Patient seen by SLP for skilled treatment focused on dysphagia goals. Patient was awake and alert and cooperative. She had been re-intubated on 9/15 and extubated 9/18 and currently on SpO2 via nasal cannula. After oral care, SLP assessed patient's toleration at bedside for honey thick juice via spoon sips, applesauce and ice chips. She was able to feed self applesauce with spoon and SLP providing tactile cues for locating her mouth. She was a little impulsive with eating applesauce, trying to take large spoonfuls at a time, and did exhibit oral transit delay. No overt s/s aspiration or penetration observed but patient did not demonstrate any voicing or attempts to communicate so cannot r/o silent aspiration/penetration. She would shake head yes/no when asked if she wanted more of a particular item. Her yes/no's did seem accurate as far as her immediate wants/needs. SLP did observe indications of swallow initiation as per palpation and full clearance of oral cavity  post swallows. SLP recommending continue NPO but patient likely ready for objective swallow study (MBS) this week.    HPI HPI: Aahna Mcroy is a 41 y.o. female who presented to the ED for acute onset of chest pain with left side weakness. CT head right MCA and vertebral stroke; underwent IR thrombectomy of R ICA with R ICA dissection repair and stent placement with TICI 3. Intubated 9/8-10.  Prior medical history of coronary artery disease with myocardial infarction, heart failure with preserved EF, HTN, uncontrolled DM, hx of strokes, hypothyroidism, right arm ischemia, CKD stage III, left foot diabetic ulcer s/p amputation of the fifth toe and now with concern for osteomyelitis  of the left fourth metatarsal. She had to be reintubated on 01/15/22 and extubated 01/18/22.      SLP Plan  Continue with current plan of care      Recommendations for follow up therapy are one component of a multi-disciplinary discharge planning process, led by the attending physician.  Recommendations may be updated based on patient status, additional functional criteria and insurance authorization.    Recommendations  Diet recommendations: NPO Medication Administration: Via alternative means Supervision: Staff to assist with self feeding;Full supervision/cueing for compensatory strategies                Oral Care Recommendations: Oral care QID;Staff/trained caregiver to provide oral care;Oral care prior to ice chip/H20 Follow Up Recommendations: Acute inpatient rehab (3hours/day) Assistance recommended at discharge: Frequent or constant Supervision/Assistance SLP Visit Diagnosis: Dysphagia, unspecified (R13.10) Plan: Continue with current plan of care          Sonia Baller, MA, CCC-SLP Speech Therapy

## 2022-01-19 NOTE — Progress Notes (Signed)
Pt transferred to [address]. Family at bedside. Pt wearing glasses.

## 2022-01-19 NOTE — Progress Notes (Signed)
STROKE TEAM PROGRESS NOTE   INTERVAL HISTORY Significant other at the bedside. She was lying in bed, eyes open, awake alert, able to follow commands and also now able to answer questions appropriately with moderate to severe dysarthria.  Still has left food oozing blood from bone biopsy site.  Podiatry is on board.  CBC:  Recent Labs  Lab 01/18/22 0346 01/19/22 0405  WBC 14.2* 19.0*  HGB 7.6* 8.0*  HCT 23.4* 25.2*  MCV 92.5 92.0  PLT 408* 182*   Basic Metabolic Panel:  Recent Labs  Lab 01/16/22 0403 01/17/22 0544 01/18/22 0346 01/19/22 0405  NA 142   < > 136 138  K 3.6   < > 4.0 4.0  CL 104   < > 96* 94*  CO2 26   < > 27 33*  GLUCOSE 124*   < > 188* 172*  BUN 57*   < > 78* 77*  CREATININE 2.44*   < > 2.14* 2.14*  CALCIUM 7.9*   < > 8.0* 8.5*  MG 2.6*  --  2.7*  --   PHOS 4.6  --  4.8*  --    < > = values in this interval not displayed.   Lipid Panel:  Recent Labs  Lab 01/16/22 0403  TRIG 197*   HgbA1c:  No results for input(s): "HGBA1C" in the last 168 hours.  Urine Drug Screen:  No results for input(s): "LABOPIA", "COCAINSCRNUR", "LABBENZ", "AMPHETMU", "THCU", "LABBARB" in the last 168 hours.  Alcohol Level  No results for input(s): "ETH" in the last 168 hours.   IMAGING past 24 hours DG Chest Port 1 View  Result Date: 01/19/2022 CLINICAL DATA:  [id number].  Pneumonia. EXAM: PORTABLE CHEST 1 VIEW COMPARISON:  Portable chest yesterday at 5:38 a.m. FINDINGS: 5:27 a.m. Interval extubation. Feeding tube again extends well into the stomach with the tip not included in the exam. Left IJ central line terminates just below the superior cavoatrial junction. There is overlying monitor wiring. Today there is continued improvement in opacities in the right infrahilar area with near resolution. No focal infiltrate is seen on the left or elsewhere on the right. No pleural effusion is evident. Heart size and vasculature are normal. There is no pneumothorax. IMPRESSION: Right  infrahilar opacities are nearly resolved.  No new abnormality. Electronically Signed   By: Telford Nab M.D.   On: 01/19/2022 07:21    PHYSICAL EXAM  Temp:  [97.9 F (36.6 C)-99.3 F (37.4 C)] 98.6 F (37 C) (09/19 1701) Pulse Rate:  [73-121] 90 (09/19 1701) Resp:  [11-29] 17 (09/19 1701) BP: (98-150)/(69-102) 138/87 (09/19 1701) SpO2:  [92 %-100 %] 99 % (09/19 1701) Weight:  [80.8 kg] 80.8 kg (09/19 0500)  General - Well nourished, well developed, intubated on sedation.  Ophthalmologic - fundi not visualized due to noncooperation.  Cardiovascular - Regular rhythm and rate.  Musculoskeletal - left foot with blood tinged dressing post bone biopsy  Neuro - awake alert, eyes open continuously, able to follow simple peripheral commands, and stick out her tongue.  Able to answer questions appropriately with moderate to severe dysarthric voice, however, orientated to place, age and people.  Able to name 2/3 and repeat simple sentence.  Eyes more right gaze preference but able to cross midline, blinking to visual threat on the right but not on the left, PERRL. Left facial droop.  Tongue is midline.  RUE 4/5 with no drift  RLE 3/5 on pain. LLE mild withdraw on pain but no spontaneous  movement, plegia on LUE. Sensation, coordination not cooperative and gait not tested.   ASSESSMENT/PLAN Lauren [name] is a 41 y.o. 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 NIHSS of 8. Patient not a TPA candidate, underwent IR thrombectomy of R ICA  with R ICA dissection repair and stent  placement with TICI 3  Amaurosis fugax for 2 weeks Stroke:  Acute Right MCA patchy infarcts and punctate left MCA/ACA infarct with right ICA occlusion s/p IR with TICI 3 but right ICA dissection s/p telescope stent placement, etiology unclear, likely large vessel disease, cannot completely rule out cardioembolic source CT head Periventricular white matter hypodensities adjacent to the frontal horn of the right lateral ventricles and in the right corona radiata are new since prior exam. This may represent acute ischemia. CTA head & neck - Age indeterminate occlusion of the proximal right ICA in the neck with non opacification distally in the upper neck and intracranially. Right MCA and A1 ACA are patent, but diminutive. Severe left paraclinoid ICA stenosis. CT perfusion, no evidence of core infarct or penumbra; however, there is approximately 38 mL of T-max greater than 4 seconds in the right MCA territory which suggests possible oligemia. IR with TICI3 of intracranial R ICA, however complicated by ICA dissection status post telescope stents Post IR CT - Redemonstrated hypodensity in the right frontal WM MRI x 2 Extensive patchy areas of acute infarction involving the right MCA territory, most confluent areas along the watershed. MRA  Asymmetric diffused decreased size of right MCA branch vessels compared to the left likely reflects decreased perfusion.  2D Echo EF 30-35%, akinesis of mid to apical septal LV segments, all apical segments and apex.  LDL 39 HgbA1c 8.0 Hypercoag work-up mildly abnormal lupus anticoagulant likely due to on heparin IV, slightly increased rheumatoid factor.  Recommend repeat in 3 months. VTE prophylaxis -heparin IV Eliquis (apixaban) daily prior to admission, now on heparin IV and Brilinta (ticagrelor) 90 mg bid.  Therapy recommendations: CIR Disposition:  pending  History of stroke 12/2019 left face and arm numbness.  MRI showed right midbrain infarct.  MRI bilateral  siphon atherosclerosis.  EF 55 to 60%.  Carotid Doppler negative.  LDL 84.9, A1c 10.3, UDS negative.  Discharged on DAPT.  Acute hypoxic respiratory Failure managed by CCM Extubated 9/10->bipap->reintubated 9/15 -> extubated 9/18 Tolerating well so far Per ID, on vancomycin and Cipro  Chronic Combined HF CAD/MI 2D echo EF 30-35%, decreased LV function, bubble study negative Cardiology on board On metoprolol 25 bid and Imdur 20 bid On lasix IV 60->80 bid Add hydralazine 12.5 mg tid per card On Brilinta and heparin IV Card recommend coumadin with brilinta  AKI on CKD  On tube feeding and free water Cr 2.47-2.48-2.19-2.44->2.41->2.14 Renal following  Osteomyelitis, left foot from diabetic ulcer  Leukocytosis On augmentin at home chronically  Blood culture so far negative podiatry on board, status post bone biopsy 9/16 WBC 26.5-19.4-17.3-22.0-22.6-27.2-18.8->14.6->14.2->19.0 On vancomycin and Cipro -> cefadroxil and metro  ID sign off if path is not back prior to discharge, please engage ID to set up and appointment.   Ischemic  limb 10/2021 right upper extremity ischemia Status post thrombectomy On Eliquis PTA Now on heparin IV  Anemia  Hgb 8.2->PRBC 9/10-> 8.7-7.5-> PRBC 9/14-> 11.6-> 7.0->7.1->7.6->8.0 Continue brilinta and heparin IV D/c ASA CBC monitoring Transfer if hemoglobin less than 7  Dysphagia  ST on board- Ice chips only NG in place On tube feeding @ 55 and free water 300->150 Q4  Hypertension Hypotension Home meds:  hydralazine, metoprolol, isosorbide Stable on the low end Off Levophed On metoprolol and imdur and lasix Long-term BP goal normotensive  Hyperlipidemia Home meds:  fenofibrate and Lipitor 40 LDL 39, goal < 70 Resumed atorvastatin 40 Continue at discharge   Diabetes type II UnControlled Metabolic Acidosis  DKA Home meds:  insulin HgbA1c 8.0, goal < 7.0 CBGs SSI Levemir 15 U bid  Tobacco abuse Current smoker Smoking  cessation counseling will be provided Nicotine patch provided  Other Stroke Risk Factors Obesity, Body mass index is 29.64 kg/m., BMI >/= 30 associated with increased stroke risk, recommend weight loss, diet and exercise as appropriate  Hx stroke/TIA  Other Active Problems Hypothyroidism, on home [facility]   Hospital day # 11   Lauren Hawking, MD PhD Stroke Neurology 01/19/2022 6:18 PM  This patient is critically ill due to large stroke, ischemic limb, sepsis, AKI, leukocytosis, anemia and at significant risk of neurological worsening, death form recurrent stroke, hemorrhagic transformation, septic shock, renal failure, heart failure. This patient's care requires constant monitoring of vital signs, hemodynamics, respiratory and cardiac monitoring, review of multiple databases, neurological assessment, discussion with family, other specialists and medical decision making of high complexity. I spent 35 minutes of neurocritical care time in the care of this patient. I had long discussion with husband at bedside, updated pt current condition, treatment plan and potential prognosis, and answered all the questions.  He expressed understanding and appreciation.

## 2022-01-19 NOTE — Progress Notes (Signed)
[place] for heparin Indication: arterial clot/ischemic arm s/p revas by vascular  Allergies  Allergen Reactions   Wellbutrin [Bupropion] Hives   Cefepime Rash    Tolerates penicilllin   Ciprofloxacin Hcl Hives and Rash    Hives/rash at injection site; 01/15/22 tolerated IV cipro   Tape Rash    Patient Measurements: Height: 5\' 5"  (165.1 cm) Weight: 80.8 kg (178 lb 2.1 oz) IBW/kg (Calculated) : 57 Heparin Dosing Weight: 77kg  Vital Signs: Temp: 98.7 F (37.1 C) (09/19 0800) Temp Source: Axillary (09/19 0800) BP: 119/87 (09/19 0600) Pulse Rate: 86 (09/19 0600)  Labs: Recent Labs    01/17/22 0544 01/17/22 1620 01/18/22 0346 01/19/22 0405  HGB 7.1*  --  7.6* 8.0*  HCT 22.4*  --  23.4* 25.2*  PLT 335  --  408* 506*  APTT 51*  --   --   --   HEPARINUNFRC 0.20* 0.51 0.38 0.42  CREATININE 2.41*  --  2.14* 2.14*     Estimated Creatinine Clearance: 37.1 mL/min (A) (by C-G formula based on SCr of 2.14 mg/dL (H)).   Assessment: 40 yoF admitted with R MCA vertebral stroke s/p thrombectomy and stent placement. Pt was on apixaban prior to admit, last dose 9/8 am. Pt with elevated troponins and now chest pain, pharmacy asked to dose IV heparin for ACS management. Will defer bolus and utilize lower goal in setting of recent CVA.  Heparin level remains therapeutic at 0.42 units/mL on heparin 1550 units/hr. No issues with administration or bleeding reported.  Goal of Therapy:  Heparin level 0.3-0.5 units/ml Monitor platelets by anticoagulation protocol: Yes   Plan:  Continue heparin at 1550 units/hr Monitor daily heparin level, CBC Monitor for signs/symptoms of bleeding    Arturo Morton, PharmD, BCPS Please check AMION for all [place] contact numbers Clinical Pharmacist 01/19/2022 9:55 AM

## 2022-01-19 NOTE — Progress Notes (Signed)
Inpatient Rehabilitation Admissions Coordinator   I will begin insurance Auth with [facility] for possible CIR admit.  Danne Baxter, RN, MSN Rehab Admissions Coordinator [phone] 01/19/2022 2:58 PM

## 2022-01-19 NOTE — Progress Notes (Signed)
Report given to Baxter Flattery on [address].

## 2022-01-19 NOTE — Progress Notes (Signed)
Rounding Note    Patient Name: Lauren [name] Date of Encounter: 01/19/2022  [place] Cardiologist: Carlyle Dolly, MD   Subjective   Denies angina or dyspnea. More engaged than yesterday. Aphasic, but answers with nods/shakes of head  Inpatient Medications    Scheduled Meds:  arformoterol  15 mcg Nebulization BID   atorvastatin  40 mg Per Tube Daily   budesonide (PULMICORT) nebulizer solution  0.5 mg Nebulization BID   cefadroxil  500 mg Per Tube BID   Chlorhexidine Gluconate Cloth  6 each Topical Daily   docusate  100 mg Per Tube BID   free water  150 mL Per Tube Q4H   furosemide  80 mg Intravenous BID   insulin aspart  0-20 Units Subcutaneous Q4H   insulin aspart  3 Units Subcutaneous Q4H   insulin detemir  20 Units Subcutaneous BID   isosorbide mononitrate  20 mg Per Tube BID   levothyroxine  75 mcg Per Tube Q0600   melatonin  3 mg Per Tube QHS   metoprolol tartrate  25 mg Per Tube BID   metroNIDAZOLE  500 mg Per Tube Q12H   nicotine  21 mg Transdermal Daily   mouth rinse  15 mL Mouth Rinse Q2H   pantoprazole  40 mg Per Tube Daily   revefenacin  175 mcg Nebulization Daily   ticagrelor  90 mg Oral BID   Or   ticagrelor  90 mg Per Tube BID   Continuous Infusions:  feeding supplement (GLUCERNA 1.5 CAL) 55 mL/hr at 01/19/22 0600   heparin 1,550 Units/hr (01/19/22 1209)   PRN Meds: acetaminophen **OR** acetaminophen (TYLENOL) oral liquid 160 mg/5 mL **OR** acetaminophen, docusate, influenza vac split quadrivalent PF, ipratropium-albuterol, labetalol, mouth rinse, pneumococcal 20-valent conjugate vaccine, polyethylene glycol, senna-docusate   Vital Signs    Vitals:   01/19/22 0800 01/19/22 0826 01/19/22 0827 01/19/22 0828  BP:      Pulse:      Resp:      Temp: 98.7 F (37.1 C)     TempSrc: Axillary     SpO2:  99% 100% 98%  Weight:      Height:        Intake/Output Summary (Last 24 hours) at 01/19/2022 1217 Last data filed at 01/19/2022  0600 Gross per 24 hour  Intake 1451.03 ml  Output 3650 ml  Net -2198.97 ml      01/19/2022    5:00 AM 01/18/2022    4:07 AM 01/16/2022    5:00 AM  Last 3 Weights  Weight (lbs) 178 lb 2.1 oz 186 lb 8.2 oz 191 lb 2.2 oz  Weight (kg) 80.8 kg 84.6 kg 86.7 kg      Telemetry    NSR - Personally Reviewed  ECG    No new tracing - Personally Reviewed  Physical Exam   GEN: No acute distress.   Neck: No JVD Cardiac: RRR, no murmurs, rubs, or gallops.  Respiratory: Clear to auscultation bilaterally. GI: Soft, nontender, non-distended  MS: No edema; No deformity. Neuro: left hemiplegia, but now turning head to left as well. Largely aphasic  Psych: Normal affect   Labs    High Sensitivity Troponin:   Recent Labs  Lab 01/08/22 1920 01/11/22 1001 01/12/22 0027 01/12/22 0203 01/13/22 1059  TROPONINIHS 860* 3,869* 3,807* 3,512* 2,880*     Chemistry Recent Labs  Lab 01/15/22 0350 01/15/22 1622 01/16/22 0403 01/17/22 0544 01/18/22 0346 01/19/22 0405  NA 144   < > 142 136  136 138  K 4.2   < > 3.6 4.3 4.0 4.0  CL 109  --  104 99 96* 94*  CO2 25  --  26 25 27  33*  GLUCOSE 124*  --  124* 353* 188* 172*  BUN 53*  --  57* 75* 78* 77*  CREATININE 2.19*  --  2.44* 2.41* 2.14* 2.14*  CALCIUM 8.2*  --  7.9* 7.8* 8.0* 8.5*  MG 2.4  --  2.6*  --  2.7*  --   ALBUMIN  --   --  <1.5*  --   --   --   GFRNONAA 29*  --  25* 26* 29* 29*  ANIONGAP 10  --  12 12 13 11    < > = values in this interval not displayed.    Lipids  Recent Labs  Lab 01/16/22 0403  TRIG 197*    Hematology Recent Labs  Lab 01/17/22 0544 01/18/22 0346 01/19/22 0405  WBC 14.6* 14.2* 19.0*  RBC 2.36* 2.53* 2.74*  HGB 7.1* 7.6* 8.0*  HCT 22.4* 23.4* 25.2*  MCV 94.9 92.5 92.0  MCH 30.1 30.0 29.2  MCHC 31.7 32.5 31.7  RDW 14.1 13.7 13.8  PLT 335 408* 506*   Thyroid No results for input(s): "TSH", "FREET4" in the last 168 hours.  BNPNo results for input(s): "BNP", "PROBNP" in the last 168 hours.   DDimer No results for input(s): "DDIMER" in the last 168 hours.   Radiology    DG Chest Port 1 View  Result Date: 01/19/2022 CLINICAL DATA:  [id number].  Pneumonia. EXAM: PORTABLE CHEST 1 VIEW COMPARISON:  Portable chest yesterday at 5:38 a.m. FINDINGS: 5:27 a.m. Interval extubation. Feeding tube again extends well into the stomach with the tip not included in the exam. Left IJ central line terminates just below the superior cavoatrial junction. There is overlying monitor wiring. Today there is continued improvement in opacities in the right infrahilar area with near resolution. No focal infiltrate is seen on the left or elsewhere on the right. No pleural effusion is evident. Heart size and vasculature are normal. There is no pneumothorax. IMPRESSION: Right infrahilar opacities are nearly resolved.  No new abnormality. Electronically Signed   By: Telford Nab M.D.   On: 01/19/2022 07:21   DG CHEST PORT 1 VIEW  Result Date: 01/18/2022 CLINICAL DATA:  Ventilator patient. EXAM: PORTABLE CHEST 1 VIEW COMPARISON:  Prior chest radiographs, most recently done [id number]. CT 11/09/2008. FINDINGS: 0538 hours. Endotracheal tube, left IJ central venous catheter and feeding tube appear unchanged in position. The heart size and mediastinal contours are stable. Mild residual interstitial opacities are present in both lung bases, little changed from yesterday, but improved from 3 days ago. No evidence of pneumothorax or significant pleural effusion. Telemetry leads overlie the chest. IMPRESSION: Stable residual interstitial opacities at both lung bases. Unchanged support system. Electronically Signed   By: Richardean Sale M.D.   On: 01/18/2022 08:27    Cardiac Studies   Echocardiogram with Bubble Study 01/10/2022: Impressions:  1. There is akinesis of the mid-to-apical septal LV segments, all apical  segments, and apex. There is significant thinning of the mid-to-apical  septal segments and apex likely represents  scarring/infarct. The  basal-to-mid inferior and basal septal  segments are hypokinetic.   2. No LV thrombus visualized on definity imaging.   3. Left ventricular ejection fraction, by estimation, is 30 to 35%. The  left ventricle has moderately decreased function. The left ventricle  demonstrates regional wall motion abnormalities (  see scoring  diagram/findings for description). The left  ventricular internal cavity size was mildly-to-moderately dilated. There  is mild concentric left ventricular hypertrophy. Indeterminate diastolic  filling due to E-A fusion.   4. Right ventricular systolic function is normal. The right ventricular  size is normal.   5. The mitral valve is normal in structure. Trivial mitral valve  regurgitation.   6. The aortic valve is tricuspid. Aortic valve regurgitation is not  visualized. No aortic stenosis is present.   7. The inferior vena cava is dilated in size with >50% respiratory  variability, suggesting right atrial pressure of 8 mmHg.   8. Agitated saline contrast bubble study was negative, with no evidence  of any interatrial shunt.   Comparison(s): Compared to prior TTE on 10/2021, the EF has dropped to  30-35% with similar wall motion.  _______________  Patient Profile     41 y.o. female with history of remote anteroapical MI (reportedly normal coronaries by angiography), LV apical aneurysm, moderately decreased LVEF with compensated HF, ischemic midbrain CVA 2020, recent right upper limb ischemia due to thrombus (11/22/21), presenting with acute left hemiplegia and aphasia 01/08/2022 due to simultaneous thrombotic occlusion of R ICA and R vertebral artery, treated w thrombectomy and small synchronous NSTEMI. Current events happened despite treatment w Eliquis and Plavix. Additional problems are DM type 1 on insulin pump, hyperlipidemia, HTN, CKD3b, treated hypothyroidism  Assessment & Plan    CVA: Some neuro recovery. She has had multiple  severe unexplained ischemic events. No record of previous cardiac cath, but no coronary artery calcifications are seen on CT imaging tests performed for other indications. Similarly, no visible calcified atherosclerotic disease is seen in the carotids, subclavians, thoracic and abdominal aorta or visceral branches. While LV thrombus could be incriminated in embolic events to R arm and cerebral circulation, it would not explain the myocardial infarction itself. No thrombus seen on Definity contrast echo on 2 separate occasions. AFib has not been detected. Previous hypercoagulable w/u includes normal results on anticardiolipin and beta2 glycoprotein 1 AB and AT3/prot C/prot S/Prothrombin Gene or Fct V Leyden mutations. Equivocal lupus anticoagulant. ANA negative Homocysteine was abnormal on labs from July and August (21.5-21.6), but only in the moderate range.  2. CHF: despite depressed LVEF, no overt CHF, clinically euvolemic. Reviewed echo images from 2021, 2022, July and September 2023. Although there has been variation in EF estimation, the wall motion pattern is unchanged, showing post infarction apical LV aneurysm (consistent with to occlusion of mid LAD artery). I do not think there has been a new classical atherothrombotic coronary event, but probably demand ischemia/injury. Long term needs re-institution of guideline directed medical management, but this is limited by comorbid conditions. (Entresto, spironolactone avoided due to CKD; cannot use SGLT2 inh due to type 1 DM and GFR <30). Was on metoprolol succinate total 25 mg daily, hydralazine-nitrates (but isosorbide dose was recently lowered due to headaches). Resume hydralazine 12.5 mg three times daily and isosorbide mononitrate 15  mg daily when possible.  3. LV apical aneurysm: despite repeated studies, no LV thrombus has been identified. Will require long term anticoagulation nonetheless. With failure of Eliquis + Plavix, may need to consider  warfarin + Brilinta.  4. DM:  on insulin, has CKD 3b-4, transient AKI during current admission.  Reasonable to supplement vit  B6, J50, folic acid for hyperhomocystinemia, despite absent solid proof that this improves vascular outcomes.     For questions or updates, please contact [place] Please consult  [email or web address] for contact info under        Signed, Sanda Klein, MD  01/19/2022, 12:17 PM

## 2022-01-20 ENCOUNTER — Telehealth: Payer: Self-pay

## 2022-01-20 DIAGNOSIS — I639 Cerebral infarction, unspecified: Secondary | ICD-10-CM | POA: Diagnosis not present

## 2022-01-20 DIAGNOSIS — I6521 Occlusion and stenosis of right carotid artery: Secondary | ICD-10-CM | POA: Diagnosis not present

## 2022-01-20 DIAGNOSIS — M86172 Other acute osteomyelitis, left ankle and foot: Secondary | ICD-10-CM | POA: Diagnosis not present

## 2022-01-20 DIAGNOSIS — N179 Acute kidney failure, unspecified: Secondary | ICD-10-CM | POA: Diagnosis not present

## 2022-01-20 LAB — GLUCOSE, CAPILLARY
Glucose-Capillary: 186 mg/dL — ABNORMAL HIGH (ref 70–99)
Glucose-Capillary: 195 mg/dL — ABNORMAL HIGH (ref 70–99)
Glucose-Capillary: 195 mg/dL — ABNORMAL HIGH (ref 70–99)
Glucose-Capillary: 202 mg/dL — ABNORMAL HIGH (ref 70–99)
Glucose-Capillary: 231 mg/dL — ABNORMAL HIGH (ref 70–99)
Glucose-Capillary: 279 mg/dL — ABNORMAL HIGH (ref 70–99)
Glucose-Capillary: 332 mg/dL — ABNORMAL HIGH (ref 70–99)

## 2022-01-20 LAB — BASIC METABOLIC PANEL
Anion gap: 15 (ref 5–15)
BUN: 80 mg/dL — ABNORMAL HIGH (ref 6–20)
CO2: 31 mmol/L (ref 22–32)
Calcium: 8.9 mg/dL (ref 8.9–10.3)
Chloride: 93 mmol/L — ABNORMAL LOW (ref 98–111)
Creatinine, Ser: 2.17 mg/dL — ABNORMAL HIGH (ref 0.44–1.00)
GFR, Estimated: 29 mL/min — ABNORMAL LOW (ref >60)
Glucose, Bld: 214 mg/dL — ABNORMAL HIGH (ref 70–99)
Potassium: 4 mmol/L (ref 3.5–5.1)
Sodium: 139 mmol/L (ref 135–145)

## 2022-01-20 LAB — HEPARIN LEVEL (UNFRACTIONATED): Heparin Unfractionated: 0.55 IU/mL (ref 0.30–0.70)

## 2022-01-20 LAB — CULTURE, BLOOD (ROUTINE X 2)
Culture: NO GROWTH
Culture: NO GROWTH
Special Requests: ADEQUATE
Special Requests: ADEQUATE

## 2022-01-20 LAB — CBC
HCT: 24.8 % — ABNORMAL LOW (ref 36.0–46.0)
Hemoglobin: 8.1 g/dL — ABNORMAL LOW (ref 12.0–15.0)
MCH: 29.3 pg (ref 26.0–34.0)
MCHC: 32.7 g/dL (ref 30.0–36.0)
MCV: 89.9 fL (ref 80.0–100.0)
Platelets: 583 10*3/uL — ABNORMAL HIGH (ref 150–400)
RBC: 2.76 MIL/uL — ABNORMAL LOW (ref 3.87–5.11)
RDW: 13.7 % (ref 11.5–15.5)
WBC: 20.3 10*3/uL — ABNORMAL HIGH (ref 4.0–10.5)
nRBC: 0.1 % (ref 0.0–0.2)

## 2022-01-20 MED ORDER — HYDRALAZINE HCL 25 MG PO TABS
12.5000 mg | ORAL_TABLET | Freq: Three times a day (TID) | ORAL | Status: DC
  Administered 2022-01-20 – 2022-01-27 (×23): 12.5 mg
  Filled 2022-01-20 (×23): qty 1

## 2022-01-20 MED ORDER — FUROSEMIDE 40 MG PO TABS: 40.0000 mg | ORAL_TABLET | Freq: Every day | ORAL | Status: DC

## 2022-01-20 NOTE — Progress Notes (Signed)
[place] for heparin Indication: arterial clot/ischemic arm s/p revas by vascular  Allergies  Allergen Reactions   Wellbutrin [Bupropion] Hives   Cefepime Rash    Tolerates penicilllin   Ciprofloxacin Hcl Hives and Rash    Hives/rash at injection site; 01/15/22 tolerated IV cipro   Tape Rash    Patient Measurements: Height: 5\' 5"  (165.1 cm) Weight: 80.8 kg (178 lb 2.1 oz) IBW/kg (Calculated) : 57 Heparin Dosing Weight: 77kg  Vital Signs: Temp: 99.4 F (37.4 C) (09/20 0332) Temp Source: Oral (09/20 0332) BP: 131/79 (09/20 0332) Pulse Rate: 79 (09/20 0332)  Labs: Recent Labs    01/18/22 0346 01/19/22 0405 01/20/22 0500 01/20/22 0640  HGB 7.6* 8.0* 8.1*  --   HCT 23.4* 25.2* 24.8*  --   PLT 408* 506* 583*  --   HEPARINUNFRC 0.38 0.42  --  0.55  CREATININE 2.14* 2.14* 2.17*  --      Estimated Creatinine Clearance: 36.5 mL/min (A) (by C-G formula based on SCr of 2.17 mg/dL (H)).   Assessment: 59 yoF admitted with R MCA vertebral stroke s/p thrombectomy and stent placement. Pt was on apixaban prior to admit, last dose 9/8 am. Pt with elevated troponins and now chest pain, pharmacy asked to dose IV heparin for ACS management. Will defer bolus and utilize lower goal in setting of recent CVA.  Heparin level slightly supratherapeutic at 0.55 on heparin 1550 units/hr. CBC stable. No issues with heparin infusing or signs of bleeding reported.   Goal of Therapy:  Heparin level 0.3-0.5 units/ml Monitor platelets by anticoagulation protocol: Yes   Plan:  Reduce heparin drip slightly to 1500 units/hr Monitor daily heparin level, CBC Monitor for signs/symptoms of bleeding   Dimple Nanas, PharmD, BCPS 01/20/2022 7:24 AM

## 2022-01-20 NOTE — Progress Notes (Signed)
Will continue to check for discharge.  Noreene Larsson, [place], [place] [address] Direct Dial: [phone] [email or web address]

## 2022-01-20 NOTE — Progress Notes (Signed)
STROKE TEAM PROGRESS NOTE   INTERVAL HISTORY No family is at the bedside. She was lying in bed, eyes open, awake alert, able to follow commands and answer orientation questions correctly.  Much improved dysarthria.  Pending modified barium study tomorrow.  If passes swallow, may switch heparin IV to p.o.  CBC:  Recent Labs  Lab 01/19/22 0405 01/20/22 0500  WBC 19.0* 20.3*  HGB 8.0* 8.1*  HCT 25.2* 24.8*  MCV 92.0 89.9  PLT 506* 412*   Basic Metabolic Panel:  Recent Labs  Lab 01/16/22 0403 01/17/22 0544 01/18/22 0346 01/19/22 0405 01/20/22 0500  NA 142   < > 136 138 139  K 3.6   < > 4.0 4.0 4.0  CL 104   < > 96* 94* 93*  CO2 26   < > 27 33* 31  GLUCOSE 124*   < > 188* 172* 214*  BUN 57*   < > 78* 77* 80*  CREATININE 2.44*   < > 2.14* 2.14* 2.17*  CALCIUM 7.9*   < > 8.0* 8.5* 8.9  MG 2.6*  --  2.7*  --   --   PHOS 4.6  --  4.8*  --   --    < > = values in this interval not displayed.   Lipid Panel:  Recent Labs  Lab 01/16/22 0403  TRIG 197*   HgbA1c:  No results for input(s): "HGBA1C" in the last 168 hours.   IMAGING past 24 hours No results found.  PHYSICAL EXAM  Temp:  [98.5 F (36.9 C)-99.7 F (37.6 C)] 99.7 F (37.6 C) (09/20 1144) Pulse Rate:  [76-90] 88 (09/20 1532) Resp:  [15-17] 16 (09/20 1144) BP: (119-138)/(73-87) 129/85 (09/20 1532) SpO2:  [92 %-100 %] 96 % (09/20 1144)  General - Well nourished, well developed, intubated on sedation.  Ophthalmologic - fundi not visualized due to noncooperation.  Cardiovascular - Regular rhythm and rate.  Musculoskeletal - left foot with blood tinged dressing post bone biopsy  Neuro - awake alert, eyes open continuously, able to follow simple commands, answer questions appropriately with mild to moderate dysarthria, orientated to place, time, and age.  Able to name 2/3 and repeat simple sentence.  Eyes more right gaze preference but able to cross midline, blinking to visual threat on the right but not on  the left, PERRL. Left facial droop.  Tongue is midline.  RUE 4/5 with no drift  RLE 3/5 on pain. LLE 3-/5 withdraw on pain but no spontaneous movement, plegia on LUE. Sensation, coordination not cooperative and gait not tested.   ASSESSMENT/PLAN Ms. Dyneisha Murchison is a 41 y.o. 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 NIHSS of 8. Patient not a TPA candidate, underwent IR thrombectomy of R ICA  with R ICA dissection repair and stent placement with TICI 3  Amaurosis fugax for 2 weeks Stroke:  Acute Right MCA patchy infarcts and punctate left MCA/ACA infarct with right ICA occlusion s/p IR with TICI 3 but right ICA dissection s/p telescope stent placement, etiology unclear, likely large vessel disease, cannot completely rule out cardioembolic source CT head Periventricular  white matter hypodensities adjacent to the frontal horn of the right lateral ventricles and in the right corona radiata are new since prior exam. This may represent acute ischemia. CTA head & neck - Age indeterminate occlusion of the proximal right ICA in the neck with non opacification distally in the upper neck and intracranially. Right MCA and A1 ACA are patent, but diminutive. Severe left paraclinoid ICA stenosis. CT perfusion, no evidence of core infarct or penumbra; however, there is approximately 38 mL of T-max greater than 4 seconds in the right MCA territory which suggests possible oligemia. IR with TICI3 of intracranial R ICA, however complicated by ICA dissection status post telescope stents Post IR CT -  Redemonstrated hypodensity in the right frontal WM MRI x 2 Extensive patchy areas of acute infarction involving the right MCA territory, most confluent areas along the watershed. MRA  Asymmetric diffused decreased size of right MCA branch vessels compared to the left likely reflects decreased perfusion.  2D Echo EF 30-35%, akinesis of mid to apical septal LV segments, all apical segments and apex.  LDL 39 HgbA1c 8.0 Hypercoag work-up mildly abnormal lupus anticoagulant likely due to on heparin IV, slightly increased rheumatoid factor.  Recommend repeat in 3 months. VTE prophylaxis -heparin IV Eliquis (apixaban) daily prior to admission, now on heparin IV and Brilinta (ticagrelor) 90 mg bid. If no need of PEG tube, will switch heparin IV to lovenox or coumadin Therapy recommendations: CIR Disposition:  pending  History of stroke 12/2019 left face and arm numbness.  MRI showed right midbrain infarct.  MRI bilateral siphon atherosclerosis.  EF 55 to 60%.  Carotid Doppler negative.  LDL 84.9, A1c 10.3, UDS negative.  Discharged on DAPT.  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  Leukocytosis On augmentin at home chronically  Blood culture so far negative podiatry s/p bone biopsy 9/16 - negative WBC 26.5-19.4-17.3-22.0-22.6-27.2-18.8->14.6->14.2->19.0-20.3 On vancomycin and Cipro -> cefadroxil and metro per ID  Ischemic limb Unspecific hypercoagulability 10/2021 right  upper extremity ischemia Status post thrombectomy On Eliquis PTA Oncology consulted and recommend lovenox therapeutic dosing Now on heparin IV  Anemia  Hgb 8.2->PRBC 9/10-> 8.7-7.5-> PRBC 9/14-> 11.6-> 7.0->7.1->7.6->8.0-8.1 Continue brilinta and heparin IV D/c ASA CBC monitoring Transfer if hemoglobin less than 7  Dysphagia  ST on board- Ice chips only NG in place On tube feeding @ 55 and free water 300->150 Q4 Modified barium study tomorrow  Hypertension Hypotension Home meds:  hydralazine, metoprolol, isosorbide Stable on the low end Off Levophed On metoprolol and imdur and lasix Long-term BP goal normotensive  Hyperlipidemia Home meds:  fenofibrate and Lipitor 40 LDL 39, goal < 70 Resumed atorvastatin 40 Continue at discharge   Diabetes type II UnControlled Metabolic Acidosis  DKA Home meds:  insulin HgbA1c 8.0, goal < 7.0 CBGs SSI Novolog 3U Q4h Levemir 15->20 U bid  Tobacco abuse Current smoker Smoking cessation counseling will be provided Nicotine patch provided  Other Stroke Risk Factors Obesity, Body mass index is 29.64 kg/m., BMI >/= 30 associated with increased stroke  risk, recommend weight loss, diet and exercise as appropriate  Hx stroke/TIA  Other Active Problems Hypothyroidism, on home Synthroid   Hospital day # 12  I discussed with Dr. Tamala Julian and speech therapist. I spent  extensive time with the patient, more than 50% of which was spent in counseling and coordination of care, reviewing test results, images and medication, and discussing the diagnosis, treatment plan and potential prognosis. This patient's care requiresreview of multiple databases, neurological assessment, discussion with family, other specialists and medical decision making of high complexity.   Rosalin Hawking, MD PhD Stroke Neurology 01/20/2022 4:06 PM

## 2022-01-20 NOTE — Progress Notes (Signed)
TOC flagged for risk of food insecurity. Currently pt with cortrak and some cognitive issues. CM following and will address this with patient when she is able to fully comprehend the information.

## 2022-01-20 NOTE — Progress Notes (Signed)
NAME:  Lauren [name], MRN:  [id number], DOB:  1981/04/29, LOS: 17 ADMISSION DATE:  01/08/2022, CONSULTATION DATE:  9/8 REFERRING MD:  Dr Curly Shores, CHIEF COMPLAINT:  Stroke   History of Present Illness:  41 y/o F with known CAD, HFrEF, DM I c/b neuropathy, stage III CKD, peripheral vascular disease admitted with R MCA stroke and vertebral artery stroke.   Patient presented to the ED 9/8 with left sided facial droop, left sided weakness, and slurred speech.  LKW: unknown.  Code stroke initiated. NIHSS: 8.  Imaging revealed: age indeterminate occlusion of proximal right ICA, V3 segment of right vertebral artery, left paraclinoid ICA stenosis. No core infarct or penumbra. Patient went immediately to IR for thrombectomy.  Of note, patient with recent arterial thrombectomy of the right upper extremity due to limb ischemia.  She has had URI x 2 days with recent sick contact.   COVID negative but Rhinovirus positive. Extubated in ICU post procedure.     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  NIHSS 8, to IR  for thrombectomy, intubated.  MRSA PCR +, Rhinovirus + 9/10 PSV wean, moving RUE/RLE, extubated  9/11 ECHO>, CT head negative for acute changes, Trach asp negative,  9/13 started on HFNC 10L overnight 9/14 tmax 101.2, remains on HFNC, MRI, Heme/onc and ID consult 9/15 intuabted  9/18 extubated, infectious disease change antimicrobial coverage to cefadroxil and Flagyl 9/19: Required Precedex overnight for restlessness.  Off by a.m. rounds  Interim History / Subjective:  Patient awake, although sleepy.   Objective    Blood pressure 124/81, pulse 81, temperature 99.4 F (37.4 C), temperature source Oral, resp. rate 15, height 5' 5"  (1.651 m), weight 80.8 kg, last menstrual period 11/01/2021, SpO2 97 %.        Intake/Output Summary (Last 24 hours) at 01/20/2022 0911 Last data filed at 01/19/2022 1700 Gross per 24 hour  Intake 774.49 ml  Output 900 ml  Net -125.51 ml   Filed Weights   01/16/22 0500 01/18/22 0407 01/19/22 0500  Weight: 86.7 kg 84.6 kg 80.8 kg    Examination: General: middle aged female in bed, NAD HENT: [place]/AT. Slight left sided facial droop Lungs: Minimal crackles at lung bases, normal work of breathing on 2 L [place].  Cardiovascular: RRR, no murmurs appreciated  Abdomen: soft, nontender, nondistended Extremities: Warm, dry. 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  - Continue statin, brilinta - Not currently on aspirin  - IV heparin  - Neuro recommending repeat hypercoag workup in 3 months - Serial neuro checks - PT/OT as able - CIR upon discharge  Acute hypoxic respiratory failure  COPD exacerbation Rhinovirus Tobacco abuse Patient has been intubated twice, most recently extubated 9/18. Suspect respiratory distress is moreso due to volume. Only had 900 cc UOP in last 24 hrs.  - IV lasix 80 mg bid - Yupelri nebulizer daily  -  Brovana nebulizer bid - Pulmicort nebulizer bid - Duoneb q4h prn - Aspiration precautions  - Continue to titrate/wean nasal cannula; maintain SpO2 between 88-92% - Smoking cessation counseling  AKI on CKD IV Cr remains stable compared to day prior, at 2.17. Possibly secondary to contrast induced nephropathy. - Daily BMP - Diuresis as above - Avoid nephrotoxins  Chronic systolic and diastolic  CHF CAD Elevated troponin 2/2 NSTEMI vs demand ischemia  Hypertension Echo on 9/14 with EF of 35-40% and grade II diastolic dysfunction. - Appreciate cardiology input; patient will likely need ischemic eval as an outpatient - Continue isosorbide mononitrate 20 mg bid, metoprolol 25 mg bid, atorvastatin 40 mg daily, and brilinta 90 mg bid - No ACEi/ARB/ARNI/MRA/SGLT2 given renal function  Anemia Secondary to blood loss and pt is s/p 3u PRBC this admission. Anemia also likely secondary to anemia of chronic kidney disease. Hb stable at 8.1.  - Daily CBC - Monitor for signs of bleeding  Type 1 Diabetes - CBG goal 140-180 - Levemir 20u bid + SSI  Hypothyroidism - Continue synthroid   Left foot osteomyelitis Diabetic ulcer - Podiatry following, no acute OR plans  - Cefadroxil and metronidazole per ID; will require ID follow up as an outpatient  Nutrition - Tube feeds   Best Practice (right click and "Reselect all SmartList Selections" daily)   Diet/type: tubefeeds DVT prophylaxis: systemic heparin GI prophylaxis: PPI Lines: Central line Foley:  N/A Code Status:  full code Last date of multidisciplinary goals of care discussion [updated pt and husband at bedside 9/20]  Labs   CBC: Recent Labs  Lab 01/16/22 0403 01/17/22 0544 01/18/22 0346 01/19/22 0405 01/20/22 0500  WBC 18.8* 14.6* 14.2* 19.0* 20.3*  HGB 7.0* 7.1* 7.6* 8.0* 8.1*  HCT 22.0* 22.4* 23.4* 25.2* 24.8*  MCV 95.2 94.9 92.5 92.0 89.9  PLT 309 335 408* 506* 583*    Basic Metabolic Panel: Recent Labs  Lab 01/14/22 0653 01/14/22 1410 01/15/22 0714 01/15/22 1622 01/16/22 0403 01/17/22 0544 01/18/22 0346 01/19/22 0405 01/20/22 0500  NA  --    < > 144   < > 142 136 136 138 139  K  --    < > 4.2   < > 3.6 4.3 4.0 4.0 4.0  CL  --    < > 109  --  104 99 96* 94* 93*  CO2  --    < > 25  --  26 25 27  33* 31  GLUCOSE  --    < > 124*  --  124* 353* 188* 172* 214*  BUN  --    < > 53*  --  57* 75* 78* 77*  80*  CREATININE  --    < > 2.19*  --  2.44* 2.41* 2.14* 2.14* 2.17*  CALCIUM  --    < > 8.2*  --  7.9* 7.8* 8.0* 8.5* 8.9  MG 2.4  --  2.4  --  2.6*  --  2.7*  --   --   PHOS  --   --   --   --  4.6  --  4.8*  --   --    < > = values in this interval not displayed.   GFR: Estimated Creatinine Clearance: 36.5 mL/min (A) (by C-G formula based on SCr of 2.17 mg/dL (H)). Recent Labs  Lab 01/17/22 0544 01/18/22 0346 01/19/22 0405 01/20/22 0500  WBC 14.6* 14.2* 19.0* 20.3*    Liver Function Tests: Recent Labs  Lab  01/16/22 0403  ALBUMIN <1.5*   No results for input(s): "LIPASE", "AMYLASE" in the last 168 hours. No results for input(s): "AMMONIA" in the last 168 hours.  ABG    Component Value Date/Time   PHART 7.465 (H) 01/15/2022 1622   PCO2ART 42.1 01/15/2022 1622   PO2ART 463 (H) 01/15/2022 1622   HCO3 30.1 (H) 01/15/2022 1622   TCO2 31 01/15/2022 1622   ACIDBASEDEF 5.5 (H) 01/09/2022 0740   O2SAT 100 01/15/2022 1622     Coagulation Profile: No results for input(s): "INR", "PROTIME" in the last 168 hours.  Cardiac Enzymes: No results for input(s): "CKTOTAL", "CKMB", "CKMBINDEX", "TROPONINI" in the last 168 hours.  HbA1C: HbA1c POC (<> result, manual entry)  Date/Time Value Ref Range Status  09/29/2021 08:47 AM 9.6 4.0 - 5.6 % Final   Hgb A1c MFr Bld  Date/Time Value Ref Range Status  01/09/2022 02:25 PM 8.0 (H) 4.8 - 5.6 % Final    Comment:    (NOTE) Pre diabetes:          5.7%-6.4%  Diabetes:              >6.4%  Glycemic control for   <7.0% adults with diabetes   06/19/2021 04:07 AM 9.8 (H) 4.8 - 5.6 % Final    Comment:    (NOTE) Pre diabetes:          5.7%-6.4%  Diabetes:              >6.4%  Glycemic control for   <7.0% adults with diabetes     CBG: Recent Labs  Lab 01/19/22 1648 01/19/22 2117 01/20/22 0054 01/20/22 0419 01/20/22 0815  GLUCAP 138* 178* 195* 202* 186*    Past Medical History:  She,  has a past medical history of  Anemia, CAD (coronary artery disease), CHF (congestive heart failure) ([place]), CKD (chronic kidney disease) stage 4, GFR 15-29 ml/min ([place]), Diabetes mellitus without complication ([place]), Myocardial infarction ([place]), Neuropathy, and Stroke ([place]).   Surgical History:   Past Surgical History:  Procedure Laterality Date   AMPUTATION Left 09/02/2021   Procedure: AMPUTATION RAY;  Surgeon: Criselda Peaches, DPM;  Location: [facility];  Service: Podiatry;  Laterality: Left;  sagittal saw, 3L bag saline & Pulse   Cardiac catherization     CHOLECYSTECTOMY     I & D EXTREMITY Left 08/30/2021   Procedure: IRRIGATION AND DEBRIDEMENT WITH BONE BIOPSY;  Surgeon: [name], DPM;  Location: [facility];  Service: Podiatry;  Laterality: Left;   IR ANGIO VERTEBRAL SEL SUBCLAVIAN INNOMINATE UNI L MOD SED  01/18/2022   IR CT HEAD LTD  01/08/2022   IR INTRA CRAN STENT  01/08/2022   IR PERCUTANEOUS ART THROMBECTOMY/INFUSION INTRACRANIAL INC DIAG ANGIO  01/08/2022   IRRIGATION AND DEBRIDEMENT FOOT Left 09/04/2021   Procedure: IRRIGATION AND DEBRIDEMENT FOOT AND CLOSURE;  Surgeon: Criselda Peaches, DPM;  Location: [facility];  Service: Podiatry;  Laterality: Left;   RADIOLOGY WITH ANESTHESIA N/A 01/08/2022   Procedure: IR WITH ANESTHESIA;  Surgeon: Radiologist, Medication, MD;  Location: [facility];  Service: Radiology;  Laterality: N/A;   THROMBECTOMY BRACHIAL ARTERY Right 11/18/2021   Procedure: RIGHT BRACHIAL, RADIAL, & ULNAR ARTERY THROMBECTOMY.;  Surgeon: Marty Heck, MD;  Location: [facility];  Service: Vascular;  Laterality: Right;   TUBAL LIGATION       Social History:   reports that she quit smoking about 14 months ago. Her smoking use included cigarettes. She smoked an average of .  25 packs per day. She has never used smokeless tobacco. She reports that she does not drink alcohol and does not use drugs.   Family History:  Her family history includes Breast cancer (age of onset: 50) in her paternal grandmother; CAD in her father and  mother; CVA in her mother; Cancer in her paternal grandmother; Hyperlipidemia in her father and mother; Hypertension in her father and mother; Thyroid disease in her mother.   Allergies Allergies  Allergen Reactions   Wellbutrin [Bupropion] Hives   Cefepime Rash    Tolerates penicilllin   Ciprofloxacin Hcl Hives and Rash    Hives/rash at injection site; 01/15/22 tolerated IV cipro   Tape Rash     Home Medications  Prior to Admission medications   Medication Sig Start Date End Date Taking? Authorizing Provider  apixaban (ELIQUIS) 5 MG TABS tablet Take 1 tablet by mouth twice daily 12/17/21  Yes Marty Heck, MD  atorvastatin (LIPITOR) 40 MG tablet TAKE 1 TABLET BY MOUTH ONCE DAILY. Patient taking differently: Take 40 mg by mouth daily. 09/24/21   Barrett, Evelene Croon, PA-C  clopidogrel (PLAVIX) 75 MG tablet Take 1 tablet (75 mg total) by mouth daily. 09/24/21   Barrett, Evelene Croon, PA-C  Continuous Blood Gluc Sensor (DEXCOM G6 SENSOR) MISC Change sensor every 10 days as directed 09/29/21   Brita Romp, NP  Continuous Blood Gluc Transmit (DEXCOM G6 TRANSMITTER) MISC Change transmitter every 90 days as directed. 09/29/21   Brita Romp, NP  fenofibrate 160 MG tablet TAKE 1 TABLET BY MOUTH ONCE DAILY. Patient taking differently: Take 160 mg by mouth daily. 10/01/20   Arnoldo Lenis, MD  furosemide (LASIX) 40 MG tablet Take 1 tablet (40 mg total) by mouth daily. Take daily or as directed Patient taking differently: Take 40 mg by mouth daily as needed for fluid or edema. 09/24/21   Barrett, Evelene Croon, PA-C  hydrALAZINE (APRESOLINE) 25 MG tablet Take 1 & 1/2 tablets (37.5 mg total) by mouth 3 (three) times daily. Patient taking differently: Take 25 mg by mouth 3 (three) times daily. 02/19/21   Arnoldo Lenis, MD  Insulin Disposable Pump (OMNIPOD 5 G6 INTRO, GEN 5,) KIT Change pod every 48-72 hrs 10/19/21   Brita Romp, NP  Insulin Disposable Pump (OMNIPOD 5 G6 POD, GEN 5,)  MISC Change pod every 48-72 hours 10/19/21   Brita Romp, NP  isosorbide mononitrate (IMDUR) 30 MG 24 hr tablet Take 1 tablet (30 mg total) by mouth daily. 09/24/21 09/19/22  Barrett, Evelene Croon, PA-C  levothyroxine (SYNTHROID) 175 MCG tablet Take 175 mcg by mouth daily before breakfast.    [provider]  metoprolol succinate (TOPROL-XL) 25 MG 24 hr tablet Take 1 tablet (25 mg total) by mouth daily. 09/24/21   Barrett, Evelene Croon, PA-C  nitroGLYCERIN (NITROSTAT) 0.4 MG SL tablet Place 1 tablet (0.4 mg total) under the tongue every 5 (five) minutes x 3 doses as needed for chest pain. 09/24/21   Barrett, Evelene Croon, PA-C  oxyCODONE (OXY IR/ROXICODONE) 5 MG immediate release tablet Take 1 tablet (5 mg total) by mouth every 6 (six) hours as needed for moderate pain. 11/21/21   Oswald Hillock, MD  pantoprazole (PROTONIX) 40 MG tablet Take 1 tablet (40 mg total) by mouth daily as needed for acid reflux Patient taking differently: Take 40 mg by mouth daily as needed (heartburn). 11/26/20     potassium chloride SA (KLOR-CON M) 20 MEQ tablet Take 1 tablet (  20 mEq total) by mouth daily 09/24/21   Barrett, Evelene Croon, PA-C  predniSONE (DELTASONE) 5 MG tablet 6 day taper; Take as directed on written instructions provided during visit Patient not taking: Reported on 12/22/2021 12/11/21   Mar Daring, PA-C     Critical care time: N/A

## 2022-01-20 NOTE — Inpatient Diabetes Management (Signed)
Inpatient Diabetes Program Recommendations  AACE/ADA: New Consensus Statement on Inpatient Glycemic Control (2015)  Target Ranges:  Prepandial:   less than 140 mg/dL      Peak postprandial:   less than 180 mg/dL (1-2 hours)      Critically ill patients:  140 - 180 mg/dL   Lab Results  Component Value Date   GLUCAP 186 (H) 01/20/2022   HGBA1C 8.0 (H) 01/09/2022    Review of Glycemic Control  Latest Reference Range & Units 01/19/22 04:11 01/19/22 08:01 01/19/22 11:39 01/19/22 16:48 01/19/22 21:17 01/20/22 00:54 01/20/22 04:19 01/20/22 08:15  Glucose-Capillary 70 - 99 mg/dL 162 (H) 254 (H)  Novolog 11 units  Levemir 20 units 246 (H)  Novolog 10 units 138 (H)  Novolog 6 units 178 (H)  Novolog 7 units  Semglee 20 units 195 (H)  Novolog 7 units 202 (H)  Novolog 10 units 186 (H)  Novolog 7 units  Semglee 20 units    Diabetes history: DM Type 1 diagnosed at the age of 37 Outpatient Diabetes medications:  Omnipod insulin pump (unsure of settings) (Prior to omnipod patient was taking Semglee 34 units daily and Novolog 4-10 units with meals) Sees Rayetta Pigg, NP in [place], [place] Current orders for Inpatient glycemic control:  Novolog 3 units q 4 hours  Novolog 0-20 units q 4 hours Levemir 20 units bid  Glucerna at 55 ml/hr  Inpatient Diabetes Program Recommendations:    -  Increase Novolog Tube Feed Coverage to 5 units Q4 hours  Thanks,  Tama Headings RN, MSN, BC-ADM Inpatient Diabetes Coordinator Team Pager [phone] (8a-5p)

## 2022-01-20 NOTE — Progress Notes (Addendum)
Rounding Note    Patient Name: Lauren [name] Date of Encounter: 01/20/2022  [place] Cardiologist: Carlyle Dolly, MD   Subjective   No acute overnight events. 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 She is able to follow commands and answer questions by shaking her head. She denies any chest pain, shortness of breath, or palpitations.  Inpatient Medications    Scheduled Meds:  arformoterol  15 mcg Nebulization BID   atorvastatin  40 mg Per Tube Daily   budesonide (PULMICORT) nebulizer solution  0.5 mg Nebulization BID   cefadroxil  500 mg Per Tube BID   Chlorhexidine Gluconate Cloth  6 each Topical Daily   docusate  100 mg Per Tube BID   free water  150 mL Per Tube Q4H   furosemide  80 mg Intravenous BID   hydrALAZINE  12.5 mg Per Tube Q8H   insulin aspart  0-20 Units Subcutaneous Q4H   insulin aspart  3 Units Subcutaneous Q4H   insulin detemir  20 Units Subcutaneous BID   isosorbide mononitrate  20 mg Per Tube BID   levothyroxine  75 mcg Per Tube Q0600   melatonin  3 mg Per Tube QHS   metoprolol tartrate  25 mg Per Tube BID   metroNIDAZOLE  500 mg Per Tube Q12H   nicotine  21 mg Transdermal Daily   mouth rinse  15 mL Mouth Rinse Q2H   pantoprazole  40 mg Per Tube Daily   revefenacin  175 mcg Nebulization Daily   ticagrelor  90 mg Oral BID   Or   ticagrelor  90 mg Per Tube BID   Continuous Infusions:  feeding supplement (GLUCERNA 1.5 CAL) 55 mL/hr at 01/19/22 1700   heparin 1,500 Units/hr (01/20/22 0900)   PRN Meds: acetaminophen **OR** acetaminophen (TYLENOL) oral liquid 160 mg/5 mL **OR** acetaminophen, docusate, influenza vac split quadrivalent PF, ipratropium-albuterol, labetalol, mouth rinse, pneumococcal 20-valent conjugate vaccine, polyethylene glycol, senna-docusate   Vital Signs    Vitals:   01/20/22 0120 01/20/22 0332 01/20/22 0814 01/20/22 0822  BP: 120/83  131/79 124/81   Pulse: 76 79 81   Resp: 17 16 15    Temp: 98.5 F (36.9 C) 99.4 F (37.4 C)    TempSrc: Oral Oral    SpO2: 94% 100% 92% 97%  Weight:      Height:        Intake/Output Summary (Last 24 hours) at 01/20/2022 1017 Last data filed at 01/19/2022 1700 Gross per 24 hour  Intake 774.49 ml  Output 900 ml  Net -125.51 ml      01/19/2022    5:00 AM 01/18/2022    4:07 AM 01/16/2022    5:00 AM  Last 3 Weights  Weight (lbs) 178 lb 2.1 oz 186 lb 8.2 oz 191 lb 2.2 oz  Weight (kg) 80.8 kg 84.6 kg 86.7 kg      Telemetry    Normal sinus rhythm with rates in the 80s. One 7 beat run of NSVT. - Personally Reviewed  ECG    No new ECG tracing today. - Personally Reviewed  Physical Exam   GEN: No acute distress.   Neck: No JVD. Cardiac: RRR. No murmurs, rubs, or gallops.  Respiratory: Clear to auscultation bilaterally. No wheezes, rhonchi,  or rales. GI: Soft, non-distended, and non-tender. MS: No lower extremity edema. No deformity. Neuro:  Aphasic with left hemiplegia. Psych: Normal affect. Responds appropriately.  Labs  High Sensitivity Troponin:   Recent Labs  Lab 01/08/22 1920 01/11/22 1001 01/12/22 0027 01/12/22 0203 01/13/22 1059  TROPONINIHS 860* 3,869* 3,807* 3,512* 2,880*     Chemistry Recent Labs  Lab 01/15/22 4403 01/15/22 1622 01/16/22 0403 01/17/22 0544 01/18/22 0346 01/19/22 0405 01/20/22 0500  NA 144   < > 142   < > 136 138 139  K 4.2   < > 3.6   < > 4.0 4.0 4.0  CL 109  --  104   < > 96* 94* 93*  CO2 25  --  26   < > 27 33* 31  GLUCOSE 124*  --  124*   < > 188* 172* 214*  BUN 53*  --  57*   < > 78* 77* 80*  CREATININE 2.19*  --  2.44*   < > 2.14* 2.14* 2.17*  CALCIUM 8.2*  --  7.9*   < > 8.0* 8.5* 8.9  MG 2.4  --  2.6*  --  2.7*  --   --   ALBUMIN  --   --  <1.5*  --   --   --   --   GFRNONAA 29*  --  25*   < > 29* 29* 29*  ANIONGAP 10  --  12   < > 13 11 15    < > = values in this interval not displayed.    Lipids  Recent Labs   Lab 01/16/22 0403  TRIG 197*    Hematology Recent Labs  Lab 01/18/22 0346 01/19/22 0405 01/20/22 0500  WBC 14.2* 19.0* 20.3*  RBC 2.53* 2.74* 2.76*  HGB 7.6* 8.0* 8.1*  HCT 23.4* 25.2* 24.8*  MCV 92.5 92.0 89.9  MCH 30.0 29.2 29.3  MCHC 32.5 31.7 32.7  RDW 13.7 13.8 13.7  PLT 408* 506* 583*   Thyroid No results for input(s): "TSH", "FREET4" in the last 168 hours.  BNPNo results for input(s): "BNP", "PROBNP" in the last 168 hours.  DDimer No results for input(s): "DDIMER" in the last 168 hours.   Radiology    DG Chest Port 1 View  Result Date: 01/19/2022 CLINICAL DATA:  [id number].  Pneumonia. EXAM: PORTABLE CHEST 1 VIEW COMPARISON:  Portable chest yesterday at 5:38 a.m. FINDINGS: 5:27 a.m. Interval extubation. Feeding tube again extends well into the stomach with the tip not included in the exam. Left IJ central line terminates just below the superior cavoatrial junction. There is overlying monitor wiring. Today there is continued improvement in opacities in the right infrahilar area with near resolution. No focal infiltrate is seen on the left or elsewhere on the right. No pleural effusion is evident. Heart size and vasculature are normal. There is no pneumothorax. IMPRESSION: Right infrahilar opacities are nearly resolved.  No new abnormality. Electronically Signed   By: Telford Nab M.D.   On: 01/19/2022 07:21    Cardiac Studies   Echocardiogram with Bubble Study 01/10/2022: Impressions:  1. There is akinesis of the mid-to-apical septal LV segments, all apical  segments, and apex. There is significant thinning of the mid-to-apical  septal segments and apex likely represents scarring/infarct. The  basal-to-mid inferior and basal septal  segments are hypokinetic.   2. No LV thrombus visualized on definity imaging.   3. Left ventricular ejection fraction, by estimation, is 30 to 35%. The  left ventricle has moderately decreased function. The left ventricle  demonstrates  regional wall motion abnormalities (see scoring  diagram/findings for description). The left  ventricular internal cavity  size was mildly-to-moderately dilated. There  is mild concentric left ventricular hypertrophy. Indeterminate diastolic  filling due to E-A fusion.   4. Right ventricular systolic function is normal. The right ventricular  size is normal.   5. The mitral valve is normal in structure. Trivial mitral valve  regurgitation.   6. The aortic valve is tricuspid. Aortic valve regurgitation is not  visualized. No aortic stenosis is present.   7. The inferior vena cava is dilated in size with >50% respiratory  variability, suggesting right atrial pressure of 8 mmHg.   8. Agitated saline contrast bubble study was negative, with no evidence  of any interatrial shunt.   Comparison(s): Compared to prior TTE on 10/2021, the EF has dropped to  30-35% with similar wall motion.  _______________   Complete Echocardiogram 01/14/2022: Impressions: 1. Left ventricular ejection fraction, by estimation, is 35 to 40%. Left  ventricular ejection fraction by 2D MOD biplane is 37.1 %. The left  ventricle has moderately decreased function. The left ventricle  demonstrates regional wall motion abnormalities  (see scoring diagram/findings for description). There is mild left  ventricular hypertrophy. Left ventricular diastolic parameters are  consistent with Grade II diastolic dysfunction (pseudonormalization).  Elevated left ventricular end-diastolic pressure.  The E/e' is 35. Wall motion abnormality suggestive of LAD territory  infarct or less likely Takatsubo cardiomyopathy.   2. Right ventricular systolic function is normal. The right ventricular  size is normal. Tricuspid regurgitation signal is inadequate for assessing  PA pressure.   3. The mitral valve is abnormal. Trivial mitral valve regurgitation.   4. The inferior vena cava is normal in size with <50% respiratory  variability,  suggesting right atrial pressure of 8 mmHg.   5. The aortic valve is tricuspid. Aortic valve regurgitation is not  visualized.   Comparison(s): Changes from prior study are noted. 01/10/2022: LVEF 30-35%,  mid to apical ventricular wall thinning and severe hypokinesis to  akinesis. _______________   ABIs/TBIs 01/14/2022: Summary:  Right: Resting right ankle-brachial index indicates noncompressible right  lower extremity arteries. The right toe-brachial index is abnormal.   Left: Resting left ankle-brachial index indicates noncompressible left  lower extremity arteries. Although ankle-brachial indices are within mild  (0.80-0.94) range, patient history of non-compressible arteries on  previous exams coupled with abnormal,  monophasic waveforms suggests progression of arterial disease.   Right ABIs appear decreased compared to prior study on 08-23-2021. Left  ABIs appear decreased compared to prior study on 08-23-2021.  Patient Profile     41 y.o. 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 She went immediately to IR for thrombectomy.  Cardiology was consulted on 01/11/2022 for further evaluation of EKG changes and elevated troponin.  Assessment & Plan    NSTEMI vs Demand Ischemia CAD History of moderate to severe disease on cardiac catheterization in 2015 which was treated medically. Patient presented with an acute stroke and was found to have elevated troponin. High-sensitivity troponin peaked at 3,869. Echo shows LVEF of 30-30% with multiple wall motion abnormalities. - No  chest pain. - Continue IV Heparin for recently upper extremity arterial occlusion. - Continue Brilinta  90mg  twice daily.  - Continue Lopressor 25mg  twice daily. - Continue Imdur 20mg  twice daily. - Continue Lipitor 40mg  daily. - This may be demand ischemia from acute stroke and CHF vs true ACS. Would continue to treat medically for now and can then consider ischemic evaluation as an outpatient once she recovers from her stroke.   Acute on Chronic Combined CHF Initial Echo on 9/8 showed LVEF of 30-35% with akinesis of the mid to apical septal LV segments, all apical segments, and apex as well as significant thinning of the mid to apical septal segments and apex likely representing scarring/infarct. Repeat Echo on 9/14 showed LVEF of 35-40% with hypokinesis of the mid and distal anterior wall, mid and distal lateral wall, mid and distal anterior septum, entire apex, mid and distal inferior wall, mid anterolateral segment, and mid inferoseptal segment. Being diuresed with IV Lasix - increased to 80mg  twice daily on 01/17/2022. Documented urinary output of 6 L yesterday and net negative 2.8 L this admission. Weight down 8 lbs since yesterday and 31 lbs since admission. Creatinine stable but BUN increasing. - Does not appear significantly volume overloaded on exam but still has decreased breath sounds in bilateral bases. - Given rise in BUN, will discuss whether additional IV diuresis is needed with MD or whether we can transition to PO. - Continue Lopressor 25mg  twice daily. Would transition to Toprol-XL prior to discharge. - Continue Imdur 20mg  daily twice daily. - NO ACEi/ARB/ARNI or MRA given renal function. - Will not initiate SGLT2 inhibitor at this time given renal function and GFR <30. - Continue to monitor daily weights, strict I/Os, and renal function.  - Wall motion abnormalities consistent with LAD distribution vs  stress-induced cardiomyopathy. Will continue to manage medically at this time.  Can consider ischemic evaluation as an outpatient once patient has recovered from acute stroke.   LV Apical Aneurysm Despite repeated studies, no LV thrombus has been identified. Will require lifelong anticoagulation nonetheless given right upper extremity arterial occlusion and stroke. With failure of Eliquis + Plavix, may need to consider Warfarin + Brilinta.   Hypertension BP mostly well controlled.  - Continue Lopressor 25mg  twice daily, Hydralazine 12.5mg  three times daily, and Imdur 20mg  twice daily.   Hyperlipidemia Lipid panel this admission: Total Cholesterol 85, Triglycerides 154, HDL 15, LDL 39. - Continue Lipitor 40mg  daily.   Type 1 Diabetes Mellitus - Management per primary team.   Acute on CKD stage IV Baseline creatinine around 2.0 to 2.5.  Creatinine 2.15 on admission but peaked at 4.54 on 9/10. Improving the last few days with diuresis. - Continue to monitor closely.   Right Brachial Artery Occlusion S/p thrombectomy in 10/2021. - On Eliquis prior to admission. Currently on IV Heparin.   Acute Stroke Admitted with acute stroke with right ICA occlusion s/p thrombectomy. Previous hypercoagulable work-up showed normal results on anticardiolipin and beta2 glycoprotein 1 AB and AT3/prot C/prot S/Prothrombin Gene or Fct V Leyden mutations; equivocal lupus anticoagulant but ANA negative; abnormal homocysteine  on labs from July and August (21.5-21.6) but only in the moderate range. - No evidence of atrial fibrillation on telemetry. - On Eliquis prior to admission. Currently on IV Heparin and Brilinta. With failure of Eliquis + Plavix, may need to consider Warfarin + Brilinta. Will defer to Neurology and primary team. - Continue statin. - Management per Neurology.   Osteomyelitis of Left Foot - Podiatry and ID following.   Acute on Chronic Normocytic Anemia S/p a total of 3 units  of PRBCs this admission, most recently 9/10. Felt to be multifactorial.  - Hemoglobin stable  at 8.1 today. - Management per primary team.  For questions or updates, please contact [place] Please consult [email or web address] for contact info under        Signed, Darreld Mclean, PA-C  01/20/2022, 10:17 AM    I have seen and examined the patient along with Darreld Mclean, PA-C .  I have reviewed the chart, notes and new data.  I agree with PA/NP's note.  Key new complaints: Remains aphasic, but has some signs of neurological improvement.  No chest pain no obvious shortness of breath. Key examination changes: Remains in normal rhythm, normal cardiovascular exam Key new findings / data: Creatinine (at baseline 2.1) and hemoglobin stable  PLAN: Appears euvolemic and increased BUN suggest she may be getting a little "dry".  Stop intravenous diuretics.  Restart home furosemide dose 40 mg daily.  Continue metoprolol and hydralazine/nitrates as only acceptable medical therapy for heart failure with systolic dysfunction in the setting of advanced chronic kidney disease.  Continue combined antiplatelet and anticoagulant therapy.  Unfortunately events occurred on combination clopidogrel plus apixaban.  Currently on Brilinta, which is superior to clopidogrel.  Events suggest that she needs more broad anticoagulation coverage, not just anti Xa effect.  Sanda Klein, MD, [place] [phone] 01/20/2022, 12:54 PM

## 2022-01-20 NOTE — Progress Notes (Signed)
Physical Therapy Treatment Patient Details Name: Lauren [name] MRN: [id number] DOB: 10-Aug-1980 Today's Date: 01/20/2022   History of Present Illness 41 y/o female presenting 9/8 with gait instability, L-sided facial droop, weakness, and slurred speech. 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 Hospitalization complicated by URI, Rhinovirus positive, acute COPD exacerbation, elevated troponin, possibly due to cardiac injury or Takotsubo cardiomyopathy. L 4th ray bone biopsy 01/16/22, concern for osteomyelitis. Intubated 9/15, extubated 9/18. 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    PT Comments    Session limited to PROM and attempts to activate left extremities and bed mobility due to bleeding through of left foot dressing (and only +1 assist available). No active movement noted in UE or LE. Does have increased adductor and external rotation tone in LLE and internal rotation of LUE.     Recommendations for follow up therapy are one component of a multi-disciplinary discharge planning process, led by the attending physician.  Recommendations may be updated based on patient status, additional functional criteria and insurance authorization.  Follow Up Recommendations  Acute inpatient rehab (3hours/day)     Assistance Recommended at Discharge Frequent or constant Supervision/Assistance  Patient can return home with the following Assist for transportation;Assistance with cooking/housework;Two people to help with walking and/or transfers;A lot of help with bathing/dressing/bathroom;Help with stairs or ramp for entrance   Equipment Recommendations  Other (comment) (defer to post acute)    Recommendations for Other  Services       Precautions / Restrictions Precautions Precautions: Fall;Other (comment) Precaution Comments: L inattention, R gaze preference, L hemiplegia, cortrak Restrictions Weight Bearing Restrictions: No     Mobility  Bed Mobility Overal bed mobility: Needs Assistance Bed Mobility: Rolling Rolling: Total assist (to right; min assist to left)         General bed mobility comments: rolling to left requires cues to bend RLE to assist; automatically reaches with RUE for bedrail (repeated x 2); rolling to rt tried to focus on upper body rotation, passively bringing LUE across midline, actively turning head to right (pt would not maintain and required redirection to task multiple times); lower body rolling not addressed due to Left foot bleeding through dressing and to not put pressure on left foot.    Transfers                   General transfer comment: unable to attempt +1 assist and Left foot bleeding through dressing    Ambulation/Gait               General Gait Details: deferred at this time due to bleeding through LLE   Stairs             Wheelchair Mobility    Modified Rankin (Stroke Patients Only) Modified Rankin (Stroke Patients Only) Pre-Morbid Rankin Score: No symptoms Modified Rankin: Severe disability     Balance                                            Cognition Arousal/Alertness: Awake/alert Behavior During Therapy: Restless, Flat affect Overall Cognitive Status: Difficult to assess Area of Impairment: Attention, Following  commands                   Current Attention Level: Sustained   Following Commands: Follows one step commands inconsistently       General Comments: No verbalizations this session. At times following commands for assisting with rolling and at times not        Exercises Other Exercises Other Exercises: LUE hand, wrist, eblow and shoulder PROM through all available  planes; attempting to elicit active movement with none noted Other Exercises: LLE PROM hip, knee with ankle avoided due to bloody dressing left foot; no active movement noted despite attempts to elicit    General Comments        Pertinent Vitals/Pain Pain Assessment Pain Assessment: Faces Faces Pain Scale: No hurt    Home Living                          Prior Function            PT Goals (current goals can now be found in the care plan section) Acute Rehab PT Goals Patient Stated Goal: to return home Time For Goal Achievement: 01/25/22 Potential to Achieve Goals: Good Progress towards PT goals: Progressing toward goals    Frequency    Min 4X/week      PT Plan Current plan remains appropriate    Co-evaluation              AM-PAC PT "6 Clicks" Mobility   Outcome Measure  Help needed turning from your back to your side while in a flat bed without using bedrails?: A Lot Help needed moving from lying on your back to sitting on the side of a flat bed without using bedrails?: Total Help needed moving to and from a bed to a chair (including a wheelchair)?: Total Help needed standing up from a chair using your arms (e.g., wheelchair or bedside chair)?: Total Help needed to walk in hospital room?: Total Help needed climbing 3-5 steps with a railing? : Total 6 Click Score: 7    End of Session   Activity Tolerance: Patient tolerated treatment well Patient left: in bed;with call bell/phone within reach;with bed alarm set Nurse Communication: Mobility status;Other (comment) (foot dressing bleeding through (RN aware and has call in to podiatry)) PT Visit Diagnosis: Muscle weakness (generalized) (M62.81);Hemiplegia and hemiparesis;Other abnormalities of gait and mobility (R26.89) Hemiplegia - Right/Left: Left Hemiplegia - dominant/non-dominant: Non-dominant Hemiplegia - caused by: Cerebral infarction     Time: 2620-3559 PT Time Calculation (min) (ACUTE  ONLY): 18 min  Charges:  $Neuromuscular Re-education: 8-22 mins                      [place]  Office [phone]    Rexanne Mano 01/20/2022, 1:56 PM

## 2022-01-20 NOTE — Progress Notes (Signed)
Hypercoag work up so far with no evidence of hypercoagulable disorder. Some of the labs are pending. Please consider outpt referral to hematology for follow up.

## 2022-01-20 NOTE — Progress Notes (Signed)
    Subjective: Patient's status post bedside bone biopsy performed 01/16/2022 by Dr. Amalia Hailey to assist with antibiotic coverage of osteomyelitis to the left foot managed by infectious disease.  Patient doing well at beside not acute complaints.  Objective: No active bleeding of the bone biopsy sites.  All sites are stable and clean.Improvement of the wound noted.  Assessment/Plan: Bone biopsy LT fourth metatarsal 01/16/2022 - Wounds are healing well. Continue betadine wet to dry dressing three times a week via nursing. -Bone biopsy results pending. -No acute OR plans -Continue antibiotics as per infectious disease -Podiatry to sign off. Please re-consult [name] as needed  [name] 01/20/2022, 6:26 AM

## 2022-01-20 NOTE — Progress Notes (Signed)
Inpatient Rehabilitation Admissions Coordinator   I await insurance approval for possible [facility] admit.  Danne Baxter, RN, MSN Rehab Admissions Coordinator [phone] 01/20/2022 12:36 PM

## 2022-01-21 ENCOUNTER — Ambulatory Visit: Payer: No Typology Code available for payment source | Admitting: Nurse Practitioner

## 2022-01-21 ENCOUNTER — Inpatient Hospital Stay (HOSPITAL_COMMUNITY): Payer: No Typology Code available for payment source

## 2022-01-21 ENCOUNTER — Ambulatory Visit: Payer: No Typology Code available for payment source | Admitting: Nutrition

## 2022-01-21 DIAGNOSIS — I6521 Occlusion and stenosis of right carotid artery: Secondary | ICD-10-CM | POA: Diagnosis not present

## 2022-01-21 DIAGNOSIS — D6859 Other primary thrombophilia: Secondary | ICD-10-CM

## 2022-01-21 DIAGNOSIS — N179 Acute kidney failure, unspecified: Secondary | ICD-10-CM | POA: Diagnosis not present

## 2022-01-21 DIAGNOSIS — J690 Pneumonitis due to inhalation of food and vomit: Secondary | ICD-10-CM

## 2022-01-21 DIAGNOSIS — I639 Cerebral infarction, unspecified: Secondary | ICD-10-CM | POA: Diagnosis not present

## 2022-01-21 DIAGNOSIS — M86172 Other acute osteomyelitis, left ankle and foot: Secondary | ICD-10-CM | POA: Diagnosis not present

## 2022-01-21 LAB — PROCALCITONIN: Procalcitonin: 1.18 ng/mL

## 2022-01-21 LAB — GLUCOSE, CAPILLARY
Glucose-Capillary: 197 mg/dL — ABNORMAL HIGH (ref 70–99)
Glucose-Capillary: 208 mg/dL — ABNORMAL HIGH (ref 70–99)
Glucose-Capillary: 219 mg/dL — ABNORMAL HIGH (ref 70–99)
Glucose-Capillary: 226 mg/dL — ABNORMAL HIGH (ref 70–99)
Glucose-Capillary: 266 mg/dL — ABNORMAL HIGH (ref 70–99)
Glucose-Capillary: 300 mg/dL — ABNORMAL HIGH (ref 70–99)
Glucose-Capillary: 335 mg/dL — ABNORMAL HIGH (ref 70–99)

## 2022-01-21 LAB — BASIC METABOLIC PANEL
Anion gap: 12 (ref 5–15)
BUN: 87 mg/dL — ABNORMAL HIGH (ref 6–20)
CO2: 30 mmol/L (ref 22–32)
Calcium: 8.6 mg/dL — ABNORMAL LOW (ref 8.9–10.3)
Chloride: 94 mmol/L — ABNORMAL LOW (ref 98–111)
Creatinine, Ser: 2.42 mg/dL — ABNORMAL HIGH (ref 0.44–1.00)
GFR, Estimated: 25 mL/min — ABNORMAL LOW (ref >60)
Glucose, Bld: 223 mg/dL — ABNORMAL HIGH (ref 70–99)
Potassium: 4.3 mmol/L (ref 3.5–5.1)
Sodium: 136 mmol/L (ref 135–145)

## 2022-01-21 LAB — AEROBIC/ANAEROBIC CULTURE W GRAM STAIN (SURGICAL/DEEP WOUND)
Culture: NO GROWTH
Gram Stain: NONE SEEN

## 2022-01-21 LAB — HEPARIN LEVEL (UNFRACTIONATED): Heparin Unfractionated: 0.56 IU/mL (ref 0.30–0.70)

## 2022-01-21 LAB — FACTOR 5 LEIDEN

## 2022-01-21 LAB — PROTHROMBIN GENE MUTATION

## 2022-01-21 MED ORDER — METOPROLOL SUCCINATE ER 50 MG PO TB24
50.0000 mg | ORAL_TABLET | Freq: Every day | ORAL | Status: DC
  Administered 2022-01-22: 50 mg via ORAL
  Filled 2022-01-21: qty 1

## 2022-01-21 MED ORDER — METOPROLOL TARTRATE 25 MG PO TABS
25.0000 mg | ORAL_TABLET | Freq: Two times a day (BID) | ORAL | Status: AC
  Administered 2022-01-21: 25 mg
  Filled 2022-01-21: qty 1

## 2022-01-21 MED ORDER — INSULIN DETEMIR 100 UNIT/ML ~~LOC~~ SOLN
30.0000 [IU] | Freq: Two times a day (BID) | SUBCUTANEOUS | Status: DC
  Administered 2022-01-21 – 2022-01-24 (×7): 30 [IU] via SUBCUTANEOUS
  Filled 2022-01-21 (×10): qty 0.3

## 2022-01-21 MED ORDER — INSULIN ASPART 100 UNIT/ML IJ SOLN
6.0000 [IU] | INTRAMUSCULAR | Status: DC
  Administered 2022-01-21 – 2022-01-26 (×28): 6 [IU] via SUBCUTANEOUS

## 2022-01-21 MED ORDER — ENOXAPARIN SODIUM 80 MG/0.8ML IJ SOSY
80.0000 mg | PREFILLED_SYRINGE | Freq: Two times a day (BID) | INTRAMUSCULAR | Status: DC
  Administered 2022-01-21 – 2022-01-24 (×6): 80 mg via SUBCUTANEOUS
  Filled 2022-01-21 (×6): qty 0.8

## 2022-01-21 NOTE — Progress Notes (Signed)
[place] for heparin Indication: arterial clot/ischemic arm s/p revas by vascular  Allergies  Allergen Reactions   Wellbutrin [Bupropion] Hives   Cefepime Rash    Tolerates penicilllin   Ciprofloxacin Hcl Hives and Rash    Hives/rash at injection site; 01/15/22 tolerated IV cipro   Tape Rash    Patient Measurements: Height: 5\' 5"  (165.1 cm) Weight: 83.5 kg (184 lb 1.4 oz) IBW/kg (Calculated) : 57 Heparin Dosing Weight: 77kg  Vital Signs: Temp: 98.1 F (36.7 C) (09/20 2315) Temp Source: Oral (09/20 2315) BP: 122/82 (09/20 2315) Pulse Rate: 84 (09/20 2315)  Labs: Recent Labs    01/19/22 0405 01/20/22 0500 01/20/22 0640 01/21/22 0443  HGB 8.0* 8.1*  --   --   HCT 25.2* 24.8*  --   --   PLT 506* 583*  --   --   HEPARINUNFRC 0.42  --  0.55 0.56  CREATININE 2.14* 2.17*  --   --      Estimated Creatinine Clearance: 37.1 mL/min (A) (by C-G formula based on SCr of 2.17 mg/dL (H)).   Assessment: 18 yoF admitted with R MCA vertebral stroke s/p thrombectomy and stent placement. Pt was on apixaban prior to admit, last dose 9/8 am. Pt with elevated troponins and now chest pain, pharmacy asked to dose IV heparin for ACS management. Will defer bolus and utilize lower goal in setting of recent CVA.  Heparin level slightly supratherapeutic at 0.56 on heparin 1500 units/hr. CBC stable 9/19. RN noted some bleeding from wound on foot. No issues with heparin infusing. Neurology d/w cards and planning either Lovenox or Warfarin once feeding tube plan decided/barium swallow completed.   Goal of Therapy:  Heparin level 0.3-0.5 units/ml Monitor platelets by anticoagulation protocol: Yes   Plan:  Reduce heparin drip to 1400 units/hr Monitor daily heparin level, CBC Monitor for signs/symptoms of bleeding   Dimple Nanas, PharmD, BCPS 01/21/2022 7:16 AM

## 2022-01-21 NOTE — Progress Notes (Addendum)
Speech Language Pathology Treatment: Dysphagia  Patient Details Name: Lauren [name] MRN: [id number] DOB: August 05, 1980 Today's Date: 01/21/2022 Time: 0258-5277 SLP Time Calculation (min) (ACUTE ONLY): 16 min  Assessment / Plan / Recommendation Clinical Impression  Pt was seen for dysphagia treatment with her husband present for part of the session. She was alert and cooperative during the session. Pt's verbal output was notably improved, but clarification was necessary due to dysarthria. Pt tolerated puree solids, dysphagia 3 solids, and nectar thick without overt s/s of aspiration. Bolus manipulation was prolonged, but cues were not necessary for swallowing. Mastication was prolonged with dysphagia 3 solids and pocketing was noted. Coughing was inconsistently observed with thin liquids and it appeared to correlate with larger boluses. A modified barium swallow study is recommended to further assess physiology. It is scheduled for today at 1430, and pt's prognosis for diet initiation is judged to be very good based on her performance today.    HPI HPI: Lauren [name] is a 41 y.o. female who presented to the ED for acute onset of chest pain with left side weakness. CT head right MCA and vertebral stroke; underwent IR thrombectomy of R ICA with R ICA dissection repair and stent placement with TICI 3. Intubated 9/8-10.  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 She had to be reintubated on 01/15/22 and extubated 01/18/22.      SLP Plan  MBS (today at 1430)      Recommendations for follow up therapy are one component of a multi-disciplinary discharge planning process, led by the attending physician.  Recommendations may be  updated based on patient status, additional functional criteria and insurance authorization.    Recommendations  Diet recommendations:  (defer until MBS is completed; pt may continue to have ice chips) Medication Administration: Via alternative means Supervision: Staff to assist with self feeding;Full supervision/cueing for compensatory strategies                Oral Care Recommendations: Oral care QID;Staff/trained caregiver to provide oral care;Oral care prior to ice chip/H20 Follow Up Recommendations: Acute inpatient rehab (3hours/day) Assistance recommended at discharge: Frequent or constant Supervision/Assistance SLP Visit Diagnosis: Dysphagia, unspecified (R13.10) Plan: MBS (today at 1430)         Jeanclaude Wentworth I. Hardin Negus, [place], [place] Office number [phone]  Horton Marshall  01/21/2022, 9:18 AM

## 2022-01-21 NOTE — Progress Notes (Signed)
Inpatient Rehabilitation Admissions Coordinator   I await insurance approval and bed availability to admit to CIR.  Danne Baxter, RN, MSN Rehab Admissions Coordinator [phone] 01/21/2022 11:30 AM

## 2022-01-21 NOTE — Progress Notes (Signed)
STROKE TEAM PROGRESS NOTE   INTERVAL HISTORY No family is at the bedside. She just came back from barium study.  Apparently, she passed a swallow now on dysphagia 2 and nectar thick liquid.  Will switch heparin IV to Lovenox.  CBC:  Recent Labs  Lab 01/19/22 0405 01/20/22 0500  WBC 19.0* 20.3*  HGB 8.0* 8.1*  HCT 25.2* 24.8*  MCV 92.0 89.9  PLT 506* 242*   Basic Metabolic Panel:  Recent Labs  Lab 01/16/22 0403 01/17/22 0544 01/18/22 0346 01/19/22 0405 01/20/22 0500 01/21/22 0830  NA 142   < > 136   < > 139 136  K 3.6   < > 4.0   < > 4.0 4.3  CL 104   < > 96*   < > 93* 94*  CO2 26   < > 27   < > 31 30  GLUCOSE 124*   < > 188*   < > 214* 223*  BUN 57*   < > 78*   < > 80* 87*  CREATININE 2.44*   < > 2.14*   < > 2.17* 2.42*  CALCIUM 7.9*   < > 8.0*   < > 8.9 8.6*  MG 2.6*  --  2.7*  --   --   --   PHOS 4.6  --  4.8*  --   --   --    < > = values in this interval not displayed.   Lipid Panel:  Recent Labs  Lab 01/16/22 0403  TRIG 197*   HgbA1c:  No results for input(s): "HGBA1C" in the last 168 hours.   IMAGING past 24 hours DG Swallowing Func-Speech Pathology  Result Date: 01/21/2022 Table formatting from the original result was not included. Objective Swallowing Evaluation: Type of Study: MBS-Modified Barium Swallow Study  Patient Details Name: Sybrina Laning MRN: [id number] Date of Birth: 1981-01-24 Today's Date: 01/21/2022 Time: SLP Start Time (ACUTE ONLY): 1400 -SLP Stop Time (ACUTE ONLY): 1424 SLP Time Calculation (min) (ACUTE ONLY): 24 min Past Medical History: Past Medical History: Diagnosis Date  Anemia   CAD (coronary artery disease)   a. s/p cath in 03/2014 showing 30% mid-LAD, moderate to severe disease along small D1, patent LCx, moderate to severe distal OM2 stenosis and moderate diffuse diease along RCA not amenable to PCI  CHF (congestive heart failure) ([place])   a. EF 55-60% in 12/2019 b. EF at 35-40% by echo in 05/2020  CKD (chronic kidney disease) stage 4,  GFR 15-29 ml/min (HCC)   Diabetes mellitus without complication ([place])   Myocardial infarction ([place])   Neuropathy   Stroke [facility])  Past Surgical History: Past Surgical History: Procedure Laterality Date  AMPUTATION Left 09/02/2021  Procedure: AMPUTATION RAY;  Surgeon: Criselda Peaches, DPM;  Location: [facility];  Service: Podiatry;  Laterality: Left;  sagittal saw, 3L bag saline & Pulse  Cardiac catherization    CHOLECYSTECTOMY    I & D EXTREMITY Left 08/30/2021  Procedure: IRRIGATION AND DEBRIDEMENT WITH BONE BIOPSY;  Surgeon: [name], DPM;  Location: [facility];  Service: Podiatry;  Laterality: Left;  IR ANGIO VERTEBRAL SEL SUBCLAVIAN INNOMINATE UNI L MOD SED  01/18/2022  IR CT HEAD LTD  01/08/2022  IR INTRA CRAN STENT  01/08/2022  IR PERCUTANEOUS ART THROMBECTOMY/INFUSION INTRACRANIAL INC DIAG ANGIO  01/08/2022  IRRIGATION AND DEBRIDEMENT FOOT Left 09/04/2021  Procedure: IRRIGATION AND DEBRIDEMENT FOOT AND CLOSURE;  Surgeon: Criselda Peaches, DPM;  Location: [place];  Service: Podiatry;  Laterality:  Left;  RADIOLOGY WITH ANESTHESIA N/A 01/08/2022  Procedure: IR WITH ANESTHESIA;  Surgeon: Radiologist, Medication, MD;  Location: [facility];  Service: Radiology;  Laterality: N/A;  THROMBECTOMY BRACHIAL ARTERY Right 11/18/2021  Procedure: RIGHT BRACHIAL, RADIAL, & ULNAR ARTERY THROMBECTOMY.;  Surgeon: Marty Heck, MD;  Location: [facility];  Service: Vascular;  Laterality: Right;  TUBAL LIGATION   HPI: Tine Mabee is a 41 y.o. female who presented to the ED for acute onset of chest pain with left side weakness. CT head right MCA and vertebral stroke; underwent IR thrombectomy of R ICA with R ICA dissection repair and stent placement with TICI 3. Intubated 9/8-10.  Prior medical history of coronary artery disease with myocardial infarction, heart failure with preserved EF, HTN, uncontrolled DM, hx of strokes, hypothyroidism, right arm ischemia, CKD stage III, left foot diabetic ulcer s/p amputation of the fifth toe and now with concern  for osteomyelitis of the left fourth metatarsal. She had to be reintubated on 01/15/22 and extubated 01/18/22.  Subjective: alert for brief moments  Recommendations for follow up therapy are one component of a multi-disciplinary discharge planning process, led by the attending physician.  Recommendations may be updated based on patient status, additional functional criteria and insurance authorization. Assessment / Plan / Recommendation   01/21/2022   3:21 PM Clinical Impressions Clinical Impression Pt presents with oropharyngeal dysphagia characterized by reduced labial seal, weak lingual manipulation, impaired posterior propulsion, impaired mastication, reduced bolus cohesion, reduced tongue base retraction, and a pharyngeal delay. She demonstrated left-sided anterior spillage of liquids, prolonged mastication, oral residue, delayed oral transit with intermittent lingual pumping, premature spillage, , mild vallecular residue, and swallow initiation with liquids at the level of the valleculae and pyriform sinuses. Penetration (PAS 3, 5) was noted with thin and occasionally with nectar thick liquids. Penetration often progressed to aspiration (PAS 7) with thin liquids and once with consecutive swallows of nectar thick liquids. Pt demonstrated a strong cough in response to larger amounts of aspirated material which appeared effective in expelling the aspirant. Laryngeal invasion was secondary to impaired timing of laryngeal closure. A chin tuck posture was ineffective in eliminating laryngeal invasion of liquids, and pt exhibited difficulty performing this or prompted coughing to attempt expulsion of penetrated material. A dysphagia 2 diet with nectar thick liquids is recommended at this time with observance of swallowing precautions. SLP will continue to follow pt. SLP Visit Diagnosis Dysphagia, pharyngeal phase (R13.13) Impact on safety and function Mild aspiration risk     01/21/2022   3:21 PM Treatment  Recommendations Treatment Recommendations Therapy as outlined in treatment plan below     01/21/2022   3:21 PM Prognosis Prognosis for Safe Diet Advancement Good Barriers to Reach Goals Cognitive deficits   01/21/2022   3:21 PM Diet Recommendations SLP Diet Recommendations Dysphagia 2 (Fine chop) solids;Nectar thick liquid Liquid Administration via Cup;Straw Medication Administration Crushed with puree Compensations Slow rate;Small sips/bites;Follow solids with liquid Postural Changes Seated upright at 90 degrees     01/21/2022   3:21 PM Other Recommendations Oral Care Recommendations Oral care BID Follow Up Recommendations Acute inpatient rehab (3hours/day) Assistance recommended at discharge Frequent or constant Supervision/Assistance Functional Status Assessment Patient has had a recent decline in their functional status and demonstrates the ability to make significant improvements in function in a reasonable and predictable amount of time.   01/21/2022   3:21 PM Frequency and Duration  Speech Therapy Frequency (ACUTE ONLY) min 2x/week Treatment Duration 2 weeks  01/21/2022   3:21 PM Oral Phase Oral Phase Impaired Oral - Honey Cup Weak lingual manipulation;Lingual pumping;Left anterior bolus loss;Reduced posterior propulsion;Delayed oral transit;Lingual/palatal residue;Decreased bolus cohesion;Premature spillage Oral - Nectar Cup Weak lingual manipulation;Lingual pumping;Left anterior bolus loss;Reduced posterior propulsion;Delayed oral transit;Lingual/palatal residue;Decreased bolus cohesion;Premature spillage Oral - Nectar Straw Weak lingual manipulation;Lingual pumping;Left anterior bolus loss;Reduced posterior propulsion;Delayed oral transit;Lingual/palatal residue;Decreased bolus cohesion;Premature spillage Oral - Thin Cup Weak lingual manipulation;Lingual pumping;Left anterior bolus loss;Reduced posterior propulsion;Delayed oral transit;Lingual/palatal residue;Decreased bolus cohesion;Premature spillage Oral  - Thin Straw Weak lingual manipulation;Lingual pumping;Left anterior bolus loss;Reduced posterior propulsion;Delayed oral transit;Lingual/palatal residue;Decreased bolus cohesion;Premature spillage Oral - Puree Weak lingual manipulation;Lingual pumping;Left anterior bolus loss;Reduced posterior propulsion;Delayed oral transit;Lingual/palatal residue;Decreased bolus cohesion;Premature spillage Oral - Mech Soft Lingual pumping;Left anterior bolus loss;Reduced posterior propulsion;Delayed oral transit;Lingual/palatal residue;Decreased bolus cohesion;Premature spillage;Impaired mastication;Weak lingual manipulation Oral - Regular Weak lingual manipulation;Lingual pumping;Left anterior bolus loss;Reduced posterior propulsion;Delayed oral transit;Lingual/palatal residue;Decreased bolus cohesion;Premature spillage;Impaired mastication Oral - Pill Weak lingual manipulation;Lingual pumping;Left anterior bolus loss;Reduced posterior propulsion;Delayed oral transit;Lingual/palatal residue;Decreased bolus cohesion;Premature spillage;Impaired mastication    01/21/2022   3:21 PM Pharyngeal Phase Pharyngeal Phase Impaired Pharyngeal- Honey Cup Delayed swallow initiation-vallecula;Reduced tongue base retraction;Pharyngeal residue - valleculae Pharyngeal- Nectar Cup Reduced tongue base retraction;Pharyngeal residue - valleculae;Delayed swallow initiation-pyriform sinuses;Penetration/Aspiration during swallow Pharyngeal Material enters airway, remains ABOVE vocal cords and not ejected out Pharyngeal- Nectar Straw Reduced tongue base retraction;Pharyngeal residue - valleculae;Delayed swallow initiation-pyriform sinuses;Penetration/Aspiration during swallow Pharyngeal Material enters airway, remains ABOVE vocal cords and not ejected out Pharyngeal- Thin Cup Reduced tongue base retraction;Pharyngeal residue - valleculae;Delayed swallow initiation-pyriform sinuses;Penetration/Aspiration during swallow Pharyngeal Material enters airway,  remains ABOVE vocal cords and not ejected out;Material enters airway, CONTACTS cords and not ejected out Pharyngeal- Thin Straw Reduced tongue base retraction;Pharyngeal residue - valleculae;Delayed swallow initiation-pyriform sinuses;Penetration/Aspiration during swallow Pharyngeal Material enters airway, remains ABOVE vocal cords and not ejected out;Material enters airway, CONTACTS cords and not ejected out Pharyngeal- Puree Reduced tongue base retraction;Pharyngeal residue - valleculae;Delayed swallow initiation-vallecula Pharyngeal- Mechanical Soft Reduced tongue base retraction;Pharyngeal residue - valleculae;Delayed swallow initiation-vallecula Pharyngeal- Regular Reduced tongue base retraction;Pharyngeal residue - valleculae;Delayed swallow initiation-vallecula Pharyngeal- Pill Reduced tongue base retraction;Pharyngeal residue - valleculae;Delayed swallow initiation-vallecula    01/21/2022   3:21 PM Cervical Esophageal Phase  Cervical Esophageal Phase [facility] Shanika I. Hardin Negus, [place], [place] Office number [phone] Horton Marshall 01/21/2022, 3:39 PM                      PHYSICAL EXAM  Temp:  [97.6 F (36.4 C)-98.1 F (36.7 C)] 98 F (36.7 C) (09/21 2047) Pulse Rate:  [84-91] 91 (09/21 2047) Resp:  [19-20] 19 (09/21 2047) BP: (114-123)/(78-83) 123/83 (09/21 2047) SpO2:  [93 %-100 %] 100 % (09/21 2047) Weight:  [83.5 kg] 83.5 kg (09/21 0424)  General - Well nourished, well developed, intubated on sedation.  Ophthalmologic - fundi not visualized due to noncooperation.  Cardiovascular - Regular rhythm and rate.  Musculoskeletal - left foot with blood tinged dressing post bone biopsy  Neuro - awake alert, eyes open continuously, able to follow simple commands, answer questions appropriately with mild to moderate dysarthria, orientated to place, time, and age.  Able to name 2/3 and repeat simple sentence.  Eyes more right gaze preference but able to cross  midline, blinking to visual threat on the right but not on the left, PERRL. Left facial droop.  Tongue is midline.  RUE 4/5 with no drift  RLE 3/5 on  pain. LLE 3-/5 withdraw on pain but no spontaneous movement, plegia on LUE. Sensation, coordination not cooperative and gait not tested.   ASSESSMENT/PLAN Ms. Fabianna Keats is a 41 y.o. 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 NIHSS of 8. 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 This may represent acute ischemia. CTA head & neck - Age indeterminate occlusion of the proximal right ICA in the neck with non opacification distally in the upper neck and intracranially. Right MCA and A1 ACA are patent, but diminutive. Severe left paraclinoid ICA stenosis. CT perfusion, no evidence of core infarct or penumbra; however, there is approximately 38 mL of T-max greater than 4 seconds in the right MCA territory which suggests possible oligemia. IR with TICI3 of intracranial R ICA, however complicated by ICA  dissection status post telescope stents Post IR CT - Redemonstrated hypodensity in the right frontal WM MRI x 2 Extensive patchy areas of acute infarction involving the right MCA territory, most confluent areas along the watershed. MRA  Asymmetric diffused decreased size of right MCA branch vessels compared to the left likely reflects decreased perfusion.  2D Echo EF 30-35%, akinesis of mid to apical septal LV segments, all apical segments and apex.  LDL 39 HgbA1c 8.0 Hypercoag work-up mildly abnormal lupus anticoagulant likely due to on heparin IV, slightly increased rheumatoid factor.  Recommend repeat in 3 months. VTE prophylaxis -heparin IV Eliquis (apixaban) daily prior to admission, now on heparin IV and Brilinta (ticagrelor) 90 mg bid. Will switch to Lovenox and continue Brilinta. Therapy recommendations: CIR Disposition:  pending  History of stroke 12/2019 left face and arm numbness.  MRI showed right midbrain infarct.  MRI bilateral siphon atherosclerosis.  EF 55 to 60%.  Carotid Doppler negative.  LDL 84.9, A1c 10.3, UDS negative.  Discharged on DAPT.  Acute hypoxic respiratory Failure, resolved managed by CCM Extubated 9/10->bipap->reintubated 9/15 -> extubated 9/18 Tolerating well so far  Chronic Combined HF CAD/MI 2D echo EF 30-35%, decreased LV function, bubble study negative Cardiology on board On metoprolol 25 bid and Imdur 20 bid On lasix IV 60->80 bid Add hydralazine 12.5 mg tid per card On Brilinta and heparin IV Now switched to Lovenox with Brilinta  AKI on CKD  On tube feeding and free water Cr 2.47-2.48-2.19-2.44->2.41->2.14-> 2.42 Renal following  Osteomyelitis, left foot from diabetic ulcer  Leukocytosis On augmentin at home chronically  Blood culture so far negative podiatry s/p bone biopsy 9/16 - negative WBC 26.5-19.4-17.3-22.0-22.6-27.2-18.8->14.6->14.2->19.0-20.3 On vancomycin and Cipro -> cefadroxil and metro per ID  Ischemic limb Unspecific  hypercoagulability 10/2021 right upper extremity ischemia  Status post thrombectomy On Eliquis PTA Switch heparin IV to Lovenox per hematology consult Need hematology follow-up as outpatient closely  Anemia  Hgb 8.2->PRBC 9/10-> 8.7-7.5-> PRBC 9/14-> 11.6-> 7.0->7.1->7.6->8.0-8.1 Continue brilinta and heparin IV D/c ASA CBC monitoring Transfer if hemoglobin less than 7  Dysphagia  NG in place On tube feeding @ 55 and free water 300->150 Q4 Modified barium study today Now on diet dysphagia 2 with nectar thick liquid  Hypertension Hypotension Home meds:  hydralazine, metoprolol, isosorbide Stable on the low end Off Levophed On metoprolol and imdur and lasix Long-term BP goal normotensive  Hyperlipidemia Home meds:  fenofibrate and Lipitor 40 LDL 39, goal < 70 Resumed atorvastatin 40 Continue at discharge   Diabetes type II UnControlled Metabolic Acidosis  DKA Home meds:  insulin HgbA1c 8.0, goal < 7.0 CBGs SSI Novolog 3U Q4h Levemir 15->20 U bid  Tobacco abuse Current smoker Smoking cessation counseling will be provided Nicotine patch provided  Other Stroke Risk Factors Obesity, Body mass index is 30.63 kg/m., BMI >/= 30 associated with increased stroke risk, recommend weight loss, diet and exercise as appropriate  Hx stroke/TIA  Other Active Problems Hypothyroidism, on home [facility]   Hospital day # 13  Neurology will sign off. Please call with questions. Pt will follow up with stroke clinic NP at [facility] in about 4 weeks after CIR discharge. Thanks for the consult.   Rosalin Hawking, MD PhD Stroke Neurology 01/21/2022 9:39 PM

## 2022-01-21 NOTE — Progress Notes (Signed)
Physical Therapy Treatment Patient Details Name: Lauren [name] MRN: [id number] DOB: 08-17-1980 Today's Date: 01/21/2022   History of Present Illness 41 y/o female presenting 9/8 with gait instability, L-sided facial droop, weakness, and slurred speech. 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 Hospitalization complicated by URI, Rhinovirus positive, acute COPD exacerbation, elevated troponin, possibly due to cardiac injury or Takotsubo cardiomyopathy. L 4th ray bone biopsy 01/16/22, concern for osteomyelitis. Intubated 9/15, extubated 9/18. 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    PT Comments    Patient seen for bed mobility and sitting balance/core muscle training. Patient with limited attention span and quickly stops task after completing for only 1-2 reps. Seated reaching, leaning down onto right elbow and back up to midline, trunk rotation to both sides. Requires min to mod assist to maintain balance with all activities. Transfers again deferred due to left foot bleeding through her bandage.     Recommendations for follow up therapy are one component of a multi-disciplinary discharge planning process, led by the attending physician.  Recommendations may be updated based on patient status, additional functional criteria and insurance authorization.  Follow Up Recommendations  Acute inpatient rehab (3hours/day)     Assistance Recommended at Discharge Frequent or constant Supervision/Assistance  Patient can return home with the following Assist for transportation;Assistance with cooking/housework;Two people to help with walking and/or transfers;A lot of help with bathing/dressing/bathroom;Help with stairs or ramp for  entrance   Equipment Recommendations  Other (comment) (defer to post acute)    Recommendations for Other Services       Precautions / Restrictions Precautions Precautions: Fall;Other (comment) Precaution Comments: L inattention, R gaze preference, L hemiplegia, cortrak; left foot bleeding s/p biopsy Restrictions Weight Bearing Restrictions: No     Mobility  Bed Mobility Overal bed mobility: Needs Assistance Bed Mobility: Rolling Rolling: Total assist (to right) Sidelying to sit: Mod assist, +2 for physical assistance   Sit to supine: Min assist   General bed mobility comments: rolling to right for incr ease of side to sit    Transfers                   General transfer comment: unable to attempt due to Left foot bleeding through dressing    Ambulation/Gait               General Gait Details: deferred at this time due to bleeding through LLE   Stairs             Wheelchair Mobility    Modified Rankin (Stroke Patients Only) Modified Rankin (Stroke Patients Only) Pre-Morbid Rankin Score: No symptoms Modified Rankin: Severe disability     Balance Overall balance assessment: Needs assistance Sitting-balance support: Feet supported, Single extremity supported Sitting balance-Leahy Scale: Poor Sitting balance - Comments: min assist to maintain balance EOB with tendency to lean posterior and left Postural control: Left lateral lean, Posterior lean                                  Cognition Arousal/Alertness: Awake/alert Behavior During Therapy: Restless, Flat affect Overall Cognitive Status: Difficult to assess Area of Impairment: Attention, Following commands  Current Attention Level: Sustained   Following Commands: Follows one step commands inconsistently                Exercises      General Comments        Pertinent Vitals/Pain Pain Assessment Pain Assessment: Faces Faces Pain  Scale: Hurts little more Pain Location: left knee when assisted to bend for rolling Pain Descriptors / Indicators: Discomfort, Grimacing Pain Intervention(s): Limited activity within patient's tolerance, Monitored during session    Home Living                          Prior Function            PT Goals (current goals can now be found in the care plan section) Acute Rehab PT Goals Patient Stated Goal: to return home Time For Goal Achievement: 01/25/22 Potential to Achieve Goals: Good Progress towards PT goals: Progressing toward goals    Frequency    Min 4X/week      PT Plan Current plan remains appropriate    Co-evaluation              AM-PAC PT "6 Clicks" Mobility   Outcome Measure  Help needed turning from your back to your side while in a flat bed without using bedrails?: A Lot Help needed moving from lying on your back to sitting on the side of a flat bed without using bedrails?: A Lot Help needed moving to and from a bed to a chair (including a wheelchair)?: Total Help needed standing up from a chair using your arms (e.g., wheelchair or bedside chair)?: Total Help needed to walk in hospital room?: Total Help needed climbing 3-5 steps with a railing? : Total 6 Click Score: 8    End of Session   Activity Tolerance: Patient tolerated treatment well Patient left: in bed;with call bell/phone within reach;with bed alarm set Nurse Communication: Mobility status PT Visit Diagnosis: Muscle weakness (generalized) (M62.81);Hemiplegia and hemiparesis;Other abnormalities of gait and mobility (R26.89) Hemiplegia - Right/Left: Left Hemiplegia - dominant/non-dominant: Non-dominant Hemiplegia - caused by: Cerebral infarction     Time: 8453-6468 PT Time Calculation (min) (ACUTE ONLY): 23 min  Charges:  $Therapeutic Activity: 23-37 mins                      Arby Barrette, PT Acute Rehabilitation Services  Office [phone]    Rexanne Mano 01/21/2022,  12:12 PM

## 2022-01-21 NOTE — Progress Notes (Addendum)
Rounding Note    Patient Name: Lauren [name] Date of Encounter: 01/21/2022  [place] Cardiologist: Carlyle Dolly, MD   Subjective   No acute overnight events. Patient is getting better each day. Her dysarthria has improved and she is now able to talk some. She feels like she is getting stronger each day. She denies any chest pain, shortness of breath, or palpitations.  Inpatient Medications    Scheduled Meds:  arformoterol  15 mcg Nebulization BID   atorvastatin  40 mg Per Tube Daily   budesonide (PULMICORT) nebulizer solution  0.5 mg Nebulization BID   Chlorhexidine Gluconate Cloth  6 each Topical Daily   docusate  100 mg Per Tube BID   free water  150 mL Per Tube Q4H   hydrALAZINE  12.5 mg Per Tube Q8H   insulin aspart  0-20 Units Subcutaneous Q4H   insulin aspart  6 Units Subcutaneous Q4H   insulin detemir  30 Units Subcutaneous BID   isosorbide mononitrate  20 mg Per Tube BID   levothyroxine  75 mcg Per Tube Q0600   melatonin  3 mg Per Tube QHS   [START ON 01/22/2022] metoprolol succinate  50 mg Oral Daily   metoprolol tartrate  25 mg Per Tube BID   nicotine  21 mg Transdermal Daily   mouth rinse  15 mL Mouth Rinse Q2H   pantoprazole  40 mg Per Tube Daily   revefenacin  175 mcg Nebulization Daily   ticagrelor  90 mg Oral BID   Or   ticagrelor  90 mg Per Tube BID   Continuous Infusions:  feeding supplement (GLUCERNA 1.5 CAL) 1,000 mL (01/21/22 1550)   heparin 1,400 Units/hr (01/21/22 1538)   PRN Meds: acetaminophen **OR** acetaminophen (TYLENOL) oral liquid 160 mg/5 mL **OR** acetaminophen, docusate, influenza vac split quadrivalent PF, ipratropium-albuterol, labetalol, mouth rinse, pneumococcal 20-valent conjugate vaccine, polyethylene glycol, senna-docusate   Vital Signs    Vitals:   01/21/22 0424 01/21/22 0755 01/21/22 0818 01/21/22 1210  BP:    114/78  Pulse:    87  Resp:    20  Temp:   97.6 F (36.4 C) 98 F (36.7 C)  TempSrc:   Oral  Oral  SpO2:  95%    Weight: 83.5 kg     Height:        Intake/Output Summary (Last 24 hours) at 01/21/2022 1610 Last data filed at 01/20/2022 1700 Gross per 24 hour  Intake 367.93 ml  Output --  Net 367.93 ml      01/21/2022    4:24 AM 01/19/2022    5:00 AM 01/18/2022    4:07 AM  Last 3 Weights  Weight (lbs) 184 lb 1.4 oz 178 lb 2.1 oz 186 lb 8.2 oz  Weight (kg) 83.5 kg 80.8 kg 84.6 kg      Telemetry    Normal sinus rhythm with rates in the 80s to 90s. - Personally Reviewed  ECG    No new ECG tracing today. - Personally Reviewed  Physical Exam   GEN: No acute distress.   Neck: No JVD. Cardiac: RRR. No murmurs, rubs, or gallops. Radial pulses 2+ and equal bilaterally. Respiratory: Clear to auscultation bilaterally. No significant wheezes, rhonchi, or rales appreciated. GI: Soft, non-distended, and non-tender. MS: No lower extremity edema. Left foot wrapped in ACE bandage and oozing. Skin: Warm and dry. Neuro:  Dysarthria improving. Left side hemiplegia.  Psych: Normal affect. Responds appropriately.  Labs    High Sensitivity Troponin:  Recent Labs  Lab 01/08/22 1920 01/11/22 1001 01/12/22 0027 01/12/22 0203 01/13/22 1059  TROPONINIHS 860* 3,869* 3,807* 3,512* 2,880*     Chemistry Recent Labs  Lab 01/15/22 3825 01/15/22 1622 01/16/22 0403 01/17/22 0544 01/18/22 0346 01/19/22 0405 01/20/22 0500 01/21/22 0830  NA 144   < > 142   < > 136 138 139 136  K 4.2   < > 3.6   < > 4.0 4.0 4.0 4.3  CL 109  --  104   < > 96* 94* 93* 94*  CO2 25  --  26   < > 27 33* 31 30  GLUCOSE 124*  --  124*   < > 188* 172* 214* 223*  BUN 53*  --  57*   < > 78* 77* 80* 87*  CREATININE 2.19*  --  2.44*   < > 2.14* 2.14* 2.17* 2.42*  CALCIUM 8.2*  --  7.9*   < > 8.0* 8.5* 8.9 8.6*  MG 2.4  --  2.6*  --  2.7*  --   --   --   ALBUMIN  --   --  <1.5*  --   --   --   --   --   GFRNONAA 29*  --  25*   < > 29* 29* 29* 25*  ANIONGAP 10  --  12   < > 13 11 15 12    < > = values in  this interval not displayed.    Lipids  Recent Labs  Lab 01/16/22 0403  TRIG 197*    Hematology Recent Labs  Lab 01/18/22 0346 01/19/22 0405 01/20/22 0500  WBC 14.2* 19.0* 20.3*  RBC 2.53* 2.74* 2.76*  HGB 7.6* 8.0* 8.1*  HCT 23.4* 25.2* 24.8*  MCV 92.5 92.0 89.9  MCH 30.0 29.2 29.3  MCHC 32.5 31.7 32.7  RDW 13.7 13.8 13.7  PLT 408* 506* 583*   Thyroid No results for input(s): "TSH", "FREET4" in the last 168 hours.  BNPNo results for input(s): "BNP", "PROBNP" in the last 168 hours.  DDimer No results for input(s): "DDIMER" in the last 168 hours.   Radiology    DG Swallowing Func-Speech Pathology  Result Date: 01/21/2022 Table formatting from the original result was not included. Objective Swallowing Evaluation: Type of Study: MBS-Modified Barium Swallow Study  Patient Details Name: Lauren [name] MRN: [id number] Date of Birth: 1980-10-29 Today's Date: 01/21/2022 Time: SLP Start Time (ACUTE ONLY): 1400 -SLP Stop Time (ACUTE ONLY): 1424 SLP Time Calculation (min) (ACUTE ONLY): 24 min Past Medical History: Past Medical History: Diagnosis Date  Anemia   CAD (coronary artery disease)   a. s/p cath in 03/2014 showing 30% mid-LAD, moderate to severe disease along small D1, patent LCx, moderate to severe distal OM2 stenosis and moderate diffuse diease along RCA not amenable to PCI  CHF (congestive heart failure) ([place])   a. EF 55-60% in 12/2019 b. EF at 35-40% by echo in 05/2020  CKD (chronic kidney disease) stage 4, GFR 15-29 ml/min (HCC)   Diabetes mellitus without complication ([place])   Myocardial infarction ([place])   Neuropathy   Stroke [facility])  Past Surgical History: Past Surgical History: Procedure Laterality Date  AMPUTATION Left 09/02/2021  Procedure: AMPUTATION RAY;  Surgeon: Criselda Peaches, DPM;  Location: [facility];  Service: Podiatry;  Laterality: Left;  sagittal saw, 3L bag saline & Pulse  Cardiac catherization    CHOLECYSTECTOMY    I & D EXTREMITY Left 08/30/2021  Procedure: IRRIGATION AND  DEBRIDEMENT  WITH BONE BIOPSY;  Surgeon: [name], DPM;  Location: [facility];  Service: Podiatry;  Laterality: Left;  IR ANGIO VERTEBRAL SEL SUBCLAVIAN INNOMINATE UNI L MOD SED  01/18/2022  IR CT HEAD LTD  01/08/2022  IR INTRA CRAN STENT  01/08/2022  IR PERCUTANEOUS ART THROMBECTOMY/INFUSION INTRACRANIAL INC DIAG ANGIO  01/08/2022  IRRIGATION AND DEBRIDEMENT FOOT Left 09/04/2021  Procedure: IRRIGATION AND DEBRIDEMENT FOOT AND CLOSURE;  Surgeon: Criselda Peaches, DPM;  Location: [facility];  Service: Podiatry;  Laterality: Left;  RADIOLOGY WITH ANESTHESIA N/A 01/08/2022  Procedure: IR WITH ANESTHESIA;  Surgeon: Radiologist, Medication, MD;  Location: [facility];  Service: Radiology;  Laterality: N/A;  THROMBECTOMY BRACHIAL ARTERY Right 11/18/2021  Procedure: RIGHT BRACHIAL, RADIAL, & ULNAR ARTERY THROMBECTOMY.;  Surgeon: Marty Heck, MD;  Location[facility];  Service: Vascular;  Laterality: Right;  TUBAL LIGATION   HPI: Lorinda Copland is a 41 y.o. female who presented to the ED for acute onset of chest pain with left side weakness. CT head right MCA and vertebral stroke; underwent IR thrombectomy of R ICA with R ICA dissection repair and stent placement with TICI 3. Intubated 9/8-10.  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 She had to be reintubated on 01/15/22 and extubated 01/18/22.  Subjective: alert for brief moments  Recommendations for follow up therapy are one component of a multi-disciplinary discharge planning process, led by the attending physician.  Recommendations may be updated based on patient status, additional functional criteria and insurance authorization. Assessment / Plan / Recommendation   01/21/2022   3:21 PM Clinical Impressions  Clinical Impression Pt presents with oropharyngeal dysphagia characterized by reduced labial seal, weak lingual manipulation, impaired posterior propulsion, impaired mastication, reduced bolus cohesion, reduced tongue base retraction, and a pharyngeal delay. She demonstrated left-sided anterior spillage of liquids, prolonged mastication, oral residue, delayed oral transit with intermittent lingual pumping, premature spillage, , mild vallecular residue, and swallow initiation with liquids at the level of the valleculae and pyriform sinuses. Penetration (PAS 3, 5) was noted with thin and occasionally with nectar thick liquids. Penetration often progressed to aspiration (PAS 7) with thin liquids and once with consecutive swallows of nectar thick liquids. Pt demonstrated a strong cough in response to larger amounts of aspirated material which appeared effective in expelling the aspirant. Laryngeal invasion was secondary to impaired timing of laryngeal closure. A chin tuck posture was ineffective in eliminating laryngeal invasion of liquids, and pt exhibited difficulty performing this or prompted coughing to attempt expulsion of penetrated material. A dysphagia 2 diet with nectar thick liquids is recommended at this time with observance of swallowing precautions. SLP will continue to follow pt. SLP Visit Diagnosis Dysphagia, pharyngeal phase (R13.13) Impact on safety and function Mild aspiration risk     01/21/2022   3:21 PM Treatment Recommendations Treatment Recommendations Therapy as outlined in treatment plan below     01/21/2022   3:21 PM Prognosis Prognosis for Safe Diet Advancement Good Barriers to Reach Goals Cognitive deficits   01/21/2022   3:21 PM Diet Recommendations SLP Diet Recommendations Dysphagia 2 (Fine chop) solids;Nectar thick liquid Liquid Administration via Cup;Straw Medication Administration Crushed with puree Compensations Slow rate;Small sips/bites;Follow solids with liquid Postural Changes Seated  upright at 90 degrees  01/21/2022   3:21 PM Other Recommendations Oral Care Recommendations Oral care BID Follow Up Recommendations Acute inpatient rehab (3hours/day) Assistance recommended at discharge Frequent or constant Supervision/Assistance Functional Status Assessment Patient has had a recent decline in their functional status and demonstrates the ability to make significant improvements in function in a reasonable and predictable amount of time.   01/21/2022   3:21 PM Frequency and Duration  Speech Therapy Frequency (ACUTE ONLY) min 2x/week Treatment Duration 2 weeks     01/21/2022   3:21 PM Oral Phase Oral Phase Impaired Oral - Honey Cup Weak lingual manipulation;Lingual pumping;Left anterior bolus loss;Reduced posterior propulsion;Delayed oral transit;Lingual/palatal residue;Decreased bolus cohesion;Premature spillage Oral - Nectar Cup Weak lingual manipulation;Lingual pumping;Left anterior bolus loss;Reduced posterior propulsion;Delayed oral transit;Lingual/palatal residue;Decreased bolus cohesion;Premature spillage Oral - Nectar Straw Weak lingual manipulation;Lingual pumping;Left anterior bolus loss;Reduced posterior propulsion;Delayed oral transit;Lingual/palatal residue;Decreased bolus cohesion;Premature spillage Oral - Thin Cup Weak lingual manipulation;Lingual pumping;Left anterior bolus loss;Reduced posterior propulsion;Delayed oral transit;Lingual/palatal residue;Decreased bolus cohesion;Premature spillage Oral - Thin Straw Weak lingual manipulation;Lingual pumping;Left anterior bolus loss;Reduced posterior propulsion;Delayed oral transit;Lingual/palatal residue;Decreased bolus cohesion;Premature spillage Oral - Puree Weak lingual manipulation;Lingual pumping;Left anterior bolus loss;Reduced posterior propulsion;Delayed oral transit;Lingual/palatal residue;Decreased bolus cohesion;Premature spillage Oral - Mech Soft Lingual pumping;Left anterior bolus loss;Reduced posterior propulsion;Delayed oral  transit;Lingual/palatal residue;Decreased bolus cohesion;Premature spillage;Impaired mastication;Weak lingual manipulation Oral - Regular Weak lingual manipulation;Lingual pumping;Left anterior bolus loss;Reduced posterior propulsion;Delayed oral transit;Lingual/palatal residue;Decreased bolus cohesion;Premature spillage;Impaired mastication Oral - Pill Weak lingual manipulation;Lingual pumping;Left anterior bolus loss;Reduced posterior propulsion;Delayed oral transit;Lingual/palatal residue;Decreased bolus cohesion;Premature spillage;Impaired mastication    01/21/2022   3:21 PM Pharyngeal Phase Pharyngeal Phase Impaired Pharyngeal- Honey Cup Delayed swallow initiation-vallecula;Reduced tongue base retraction;Pharyngeal residue - valleculae Pharyngeal- Nectar Cup Reduced tongue base retraction;Pharyngeal residue - valleculae;Delayed swallow initiation-pyriform sinuses;Penetration/Aspiration during swallow Pharyngeal Material enters airway, remains ABOVE vocal cords and not ejected out Pharyngeal- Nectar Straw Reduced tongue base retraction;Pharyngeal residue - valleculae;Delayed swallow initiation-pyriform sinuses;Penetration/Aspiration during swallow Pharyngeal Material enters airway, remains ABOVE vocal cords and not ejected out Pharyngeal- Thin Cup Reduced tongue base retraction;Pharyngeal residue - valleculae;Delayed swallow initiation-pyriform sinuses;Penetration/Aspiration during swallow Pharyngeal Material enters airway, remains ABOVE vocal cords and not ejected out;Material enters airway, CONTACTS cords and not ejected out Pharyngeal- Thin Straw Reduced tongue base retraction;Pharyngeal residue - valleculae;Delayed swallow initiation-pyriform sinuses;Penetration/Aspiration during swallow Pharyngeal Material enters airway, remains ABOVE vocal cords and not ejected out;Material enters airway, CONTACTS cords and not ejected out Pharyngeal- Puree Reduced tongue base retraction;Pharyngeal residue -  valleculae;Delayed swallow initiation-vallecula Pharyngeal- Mechanical Soft Reduced tongue base retraction;Pharyngeal residue - valleculae;Delayed swallow initiation-vallecula Pharyngeal- Regular Reduced tongue base retraction;Pharyngeal residue - valleculae;Delayed swallow initiation-vallecula Pharyngeal- Pill Reduced tongue base retraction;Pharyngeal residue - valleculae;Delayed swallow initiation-vallecula    01/21/2022   3:21 PM Cervical Esophageal Phase  Cervical Esophageal Phase [facility] Shanika I. Hardin Negus, [place], [place] Office number [phone] Horton Marshall 01/21/2022, 3:39 PM                      Cardiac Studies   Echocardiogram with Bubble Study 01/10/2022: Impressions:  1. There is akinesis of the mid-to-apical septal LV segments, all apical  segments, and apex. There is significant thinning of the mid-to-apical  septal segments and apex likely represents scarring/infarct. The  basal-to-mid inferior and basal septal  segments are hypokinetic.   2. No LV thrombus visualized on definity imaging.   3. Left ventricular ejection fraction, by estimation, is 30 to 35%. The  left ventricle has moderately decreased function.  The left ventricle  demonstrates regional wall motion abnormalities (see scoring  diagram/findings for description). The left  ventricular internal cavity size was mildly-to-moderately dilated. There  is mild concentric left ventricular hypertrophy. Indeterminate diastolic  filling due to E-A fusion.   4. Right ventricular systolic function is normal. The right ventricular  size is normal.   5. The mitral valve is normal in structure. Trivial mitral valve  regurgitation.   6. The aortic valve is tricuspid. Aortic valve regurgitation is not  visualized. No aortic stenosis is present.   7. The inferior vena cava is dilated in size with >50% respiratory  variability, suggesting right atrial pressure of 8 mmHg.   8. Agitated saline contrast  bubble study was negative, with no evidence  of any interatrial shunt.   Comparison(s): Compared to prior TTE on 10/2021, the EF has dropped to  30-35% with similar wall motion.  _______________   Complete Echocardiogram 01/14/2022: Impressions: 1. Left ventricular ejection fraction, by estimation, is 35 to 40%. Left  ventricular ejection fraction by 2D MOD biplane is 37.1 %. The left  ventricle has moderately decreased function. The left ventricle  demonstrates regional wall motion abnormalities  (see scoring diagram/findings for description). There is mild left  ventricular hypertrophy. Left ventricular diastolic parameters are  consistent with Grade II diastolic dysfunction (pseudonormalization).  Elevated left ventricular end-diastolic pressure.  The E/e' is 75. Wall motion abnormality suggestive of LAD territory  infarct or less likely Takatsubo cardiomyopathy.   2. Right ventricular systolic function is normal. The right ventricular  size is normal. Tricuspid regurgitation signal is inadequate for assessing  PA pressure.   3. The mitral valve is abnormal. Trivial mitral valve regurgitation.   4. The inferior vena cava is normal in size with <50% respiratory  variability, suggesting right atrial pressure of 8 mmHg.   5. The aortic valve is tricuspid. Aortic valve regurgitation is not  visualized.   Comparison(s): Changes from prior study are noted. 01/10/2022: LVEF 30-35%,  mid to apical ventricular wall thinning and severe hypokinesis to  akinesis. _______________   ABIs/TBIs 01/14/2022: Summary:  Right: Resting right ankle-brachial index indicates noncompressible right  lower extremity arteries. The right toe-brachial index is abnormal.   Left: Resting left ankle-brachial index indicates noncompressible left  lower extremity arteries. Although ankle-brachial indices are within mild  (0.80-0.94) range, patient history of non-compressible arteries on  previous exams coupled  with abnormal,  monophasic waveforms suggests progression of arterial disease.   Right ABIs appear decreased compared to prior study on 08-23-2021. Left  ABIs appear decreased compared to prior study on 08-23-2021.  Patient Profile     41 y.o. 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 She went immediately to IR for thrombectomy.  Cardiology was consulted on 01/11/2022 for further evaluation of EKG changes and elevated troponin.  Assessment & Plan    NSTEMI vs Demand Ischemia CAD History of moderate to severe disease on cardiac catheterization in 2015 which was treated medically. Patient presented with an acute stroke and was found to have elevated troponin. High-sensitivity troponin peaked at 3,869. Echo shows LVEF of 30-30% with  multiple wall motion abnormalities. - No chest pain. - Continue IV Heparin for recently upper extremity arterial occlusion. - Continue Brilinta 90mg  twice daily.  - Currently on Lopressor 25mg  twice daily. Will transition to Toprol-XL 50mg  daily tomorrow given reduced EF. - Continue Imdur 20mg  twice daily. - Continue Lipitor 40mg  daily. - Likely demand ischemia from acute stroke and CHF rather than true ACS. Would continue to treat medically for now and can then consider ischemic evaluation as an outpatient once she recovers from her stroke.   Acute on Chronic Combined CHF Initial Echo on 9/8 showed LVEF of 30-35% with akinesis of the mid to apical septal LV segments, all apical segments, and apex as well as significant thinning of the mid to apical septal  segments and apex likely representing scarring/infarct. Repeat Echo on 9/14 showed LVEF of 35-40% with hypokinesis of the mid and distal anterior wall, mid and distal lateral wall, mid and distal anterior septum, entire apex, mid and distal inferior wall, mid anterolateral segment, and mid inferoseptal segment. Initially diuresed with IV Lasix but this was stopped on 01/20/2022. Documented urinary output of 2.15 L yesterday and net negative 4.5 L this admission. Weight down 25lbs since admission. Renal function continues to slowly worsen. - Initially planning on starting PO Lasix today but with continue rise in creatinine and BUN will discuss with MD before starting. - Currently on Lopressor 25mg  twice daily. Will transition to Toprol-XL 50mg  daily tomorrow given reduced EF. - Continue Imdur 20mg  daily twice daily. - NO ACEi/ARB/ARNI or MRA given renal function. - Will not initiate SGLT2 inhibitor at this time given renal function and GFR <30. - Continue to monitor daily weights, strict I/Os, and renal function.  - Wall motion abnormalities consistent with LAD distribution vs  stress-induced cardiomyopathy. Will continue to manage medically at this time. Can consider ischemic evaluation as an outpatient once patient has recovered from acute stroke.    LV Apical Aneurysm Despite repeated studies, no LV thrombus has been identified. Will require lifelong anticoagulation nonetheless given right upper extremity arterial occlusion and stroke. Patient will be discharged on Brilinta and Coumadin vs Lovenox given recurrent stroke on Eliquis and Plavix.   Hypertension BP mostly well controlled.  - Continue Lopressor 25mg  twice daily (will switch to Toprol-XL tomorrow), Hydralazine 12.5mg  three times daily, and Imdur 20mg  twice daily.   Hyperlipidemia Lipid panel this admission: Total Cholesterol 85, Triglycerides 154, HDL 15, LDL 39. - Continue Lipitor 40mg  daily.   Type 1 Diabetes Mellitus - Management  per primary team.   Acute on CKD stage IV Baseline creatinine around 2.0 to 2.5.  Creatinine 2.15 on admission but peaked at 4.54 on 9/10. Dropped as low as 2.14 following this but back up today at 2.42. BUN increased to 87.  - See recommendations for diuresis as above. - Continue to monitor closely.   Right Brachial Artery Occlusion S/p thrombectomy in 10/2021. - On Eliquis prior to admission. Currently on IV Heparin.   Acute Stroke Admitted with acute stroke with right ICA occlusion s/p thrombectomy. Previous hypercoagulable work-up showed normal results on anticardiolipin and beta2 glycoprotein 1 AB and AT3/prot C/prot S/Prothrombin Gene or Fct V Leyden mutations; equivocal lupus anticoagulant but ANA negative; abnormal homocysteine  on labs from July and August (21.5-21.6) but only in the moderate range. - No evidence of atrial fibrillation on telemetry. - He was on Eliquis and Plavix at home. Currently on IV Heparin and Brilinta. Hematology consulted and hypercoagulable work-up negative so far. Plan  is to switch to Coumadin or Lovenox. - Continue statin. - Management per Neurology.   Osteomyelitis of Left Foot - Podiatry and ID following.   Acute on Chronic Normocytic Anemia S/p a total of 3 units of PRBCs this admission, most recently 9/10. Felt to be multifactorial.  - Hemoglobin stable at 8.1 yesterday. Today's CBC pending. - Management per primary team.  Will go ahead and arrange outpatient follow-up for about 1 month since patient will be going to CIR.  Otherwise, per primary team.  For questions or updates, please contact [facility] Please consult [email or web address] for contact info under        Signed, Darreld Mclean, PA-C  01/21/2022, 4:10 PM    I have seen and examined the patient along with Darreld Mclean, PA-C .  I have reviewed the chart, notes and new data.  I agree with PA/NP's note.  Key new complaints: improving speech. No dyspnea Key  examination changes: no signs of hypervolemia Key new findings / data: increasing BUN and creatinine  PLAN: Hold off diuretics. Recheck H/H Continue vasodilator therapy for HF. Anticoagulant/antiplatelet therapy.  Sanda Klein, MD, [place] [phone] 01/21/2022, 4:38 PM

## 2022-01-21 NOTE — Progress Notes (Signed)
[facility] PROGRESS NOTE    Progress Note  Lauren [name]  ZCH:[id number] DOB: 1980/06/12 DOA: 01/08/2022 PCP: Lemmie Evens, MD     Brief Narrative:   Lauren [name] is an 41 y.o. female with known CAD, chronic diastolic heart failure, diabetes mellitus type 2 with peripheral neuropathy, with a recent arterial thrombectomy to the right upper extremity due to limb ischemia chronic kidney disease stage III AA, peripheral vascular disease admitted with a right MCA CVA and V3 segment of the right vertebral artery stenosis with left ICA stenosis.  Initially admitted under PCCM extubated on 01/18/2022 transferred to [facility] on 01/21/2022  Significant Events: 9/8 Admitted with left sided weakness, facial droop.  NIHSS 8, to IR  for thrombectomy, intubated.  MRSA PCR +, Rhinovirus + 9/10 PSV wean, moving RUE/RLE, extubated  9/11 ECHO>, CT head negative for acute changes, Trach asp negative,  9/13 started on HFNC 10L overnight 9/14 tmax 101.2, remains on HFNC, MRI, Heme/onc and ID consult 9/18 extubated, infectious disease change antimicrobial coverage to cefadroxil and Flagyl 9/19: Required Precedex overnight for restlessness.  Off by a.m. rounds  Significant studies: CT head Periventricular white matter hypodensities adjacent to the frontal horn of the right lateral ventricles and in the right corona radiata are new since prior exam. This may represent acute ischemia. CTA head & neck - Age indeterminate occlusion of the proximal right ICA in the neck with non opacification distally in the upper neck and intracranially. Right MCA and A1 ACA are patent, but diminutive. Severe left paraclinoid ICA stenosis. CT perfusion, no evidence of core infarct or penumbra; however, there is approximately 38 mL of T-max greater than 4 seconds in the right MCA territory which suggests possible oligemia. IR with TICI3 of intracranial R ICA, however complicated by ICA dissection status post telescope  stents Post IR CT - Redemonstrated hypodensity in the right frontal WM MRI x 2 Extensive patchy areas of acute infarction involving the right MCA territory, most confluent areas along the watershed. 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 2D echo showed an EF of 30%, LDL 39, A1c of 8.0. Hypercoagulable panel showed abnormal lupus anticoagulant, but she was on heparin, slightly increased rheumatoid factor.  Will need to be repeated in 3 months.  She will need to follow-up with hematology as an outpatient they have been notified. She was previously on Eliquis now on heparin and Brilinta, if no PEG tube will have to switch IV heparin to Lovenox as per oncology recommendations that she should be on Lovenox therapeutically. Physical therapy evaluation recommended inpatient rehab Needs to go to inpatient rehab upon discharge.  Acute respiratory failure with hypoxia/COPD exacerbation/rhinovirus positive/tobacco abuse: She was intubated and admitted by [facility] now extubated. She was started on IV Lasix now n.p.o. on IV Lasix and free water discontinue Lasix She was continued on inhalers. Chest x-ray showed a right infrahilar opacity which is improved, check a procalcitonin. We will complete a 7-day course of antibiotics continue Flagyl and cefadroxil. Tmax 99.7, with a leukocytosis of 20. She is at high risk of aspiration.  She is now being weaned to room air.  Acute kidney injury on chronic kidney  disease stage IV: Early contrast-induced nephropathy, creatinine peaked at 5, now 2.1. She was treated conservatively. Continue Lasix  check a basic metabolic panel she is becoming hypochloremic and hyponatremic  Chronic systolic and diastolic heart failure/elevated troponins likely demand ischemia: With a 2D echo showing an EF of 16% grade 2 diastolic heart failure.  With wall motion abnormality to the LAD distribution question obstruction versus stress-induced cardiomyopathy. Does not appear fluid overloaded on physical exam. Cardiology was consulted and relates this may be demand ischemia from CHF and acute stroke versus acute coronary syndrome, she needs an ischemic evaluation as an outpatient. She was started on Imdur, metoprolol, statin and continue on Brilinta. No ACE inhibitor/ARB due to renal dysfunction.  Essential hypertension She is currently on metoprolol and Imdur.  Pressures well controlled hold Lasix. She is becoming hyperchloremic due to her limited intake, she is on free water.  We controlled her fluids just by restricting her free water intake.  Likely due to anemia of chronic disease: Status post 3 units of packed red blood cells on admission. Globin is stable at 8.1.  Diabetes mellitus type 1/DKA on admission: Hemoglobin A1c of 8.0, goal less than 6.5. Glucose trending up increase long-acting insulin. continue sliding scale the patient is currently n.p.o. on tube feeds through NG tube.  Hyperlipidemia: On fenofibrate and Lipitor, LDL at goal. Continue at discharge.  Nutrition/dysphagia: Continue tube feedings. Modified barium swallow hopefully today.  Hypothyroidism: Continue Synthroid.  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 Recommended to continue cefadroxil and Flagyl, will require ID follow-up as an outpatient. ID was consulted bone biopsy performed on 01/16/2022 negative till date.  Class 2 obesity due to excess  calories without serious comorbidity with body mass index (BMI) of 39.0 to 39.9  in adult: Noted.  Tobacco abuse: Current smoker she has been counseled, nicotine patch was placed.   DVT prophylaxis: on IV heparin Family Communication:none Status is: Inpatient Remains inpatient appropriate because: Acute CVA    Code Status:     Code Status Orders  (From admission, onward)           Start     Ordered   01/08/22 1926  Full code  Continuous        01/08/22 1925           Code Status History     Date Active Date Inactive Code Status Order ID Comments User Context   01/08/2022 1925 01/08/2022 1925 Full Code [id number]  Minda Ditto, NP Inpatient   01/08/2022 1536 01/08/2022 1925 Full Code [id number]  Lorenza Chick, MD ED   11/18/2021 1330 11/21/2021 1712 Full Code [id number]  Karmen Bongo, MD Inpatient   08/27/2021 2326 09/05/2021 1544 Full Code [id number]  Toy Baker, MD ED   06/18/2021 1417 06/19/2021 1930 Full Code [id number]  Kathie Dike, MD ED   06/18/2020 1905 06/19/2020 1802 Full Code [id number]  Arrien, Jimmy Picket, MD ED   12/31/2019 2110 01/02/2020 1940 Full Code [id number]  Roney Jaffe, MD ED         IV Access:   Peripheral IV   Procedures and diagnostic studies:   No results found.   Medical Consultants:   None.   Subjective:    Divina Neale she has no new complaints today.  Objective:    Vitals:   01/20/22 1931 01/20/22 2315 01/21/22 0424 01/21/22 2595  BP:  122/82    Pulse:  84    Resp:  20    Temp:  98.1 F (36.7 C)    TempSrc:  Oral    SpO2: 98% 93%  95%  Weight:   83.5 kg   Height:       SpO2: 95 % O2 Flow Rate (L/min): 2 L/min FiO2 (%): 30 %   Intake/Output Summary (Last 24 hours) at 01/21/2022 0803 Last data filed at 01/20/2022 1700 Gross per 24 hour  Intake 367.93 ml  Output 1300 ml  Net -932.07 ml   Filed Weights   01/18/22 0407 01/19/22 0500 01/21/22 0424  Weight: 84.6 kg 80.8 kg 83.5 kg     Exam: General exam: In no acute distress. Respiratory system: Good air movement and clear to auscultation. Cardiovascular system: S1 & S2 heard, RRR. No JVD, murmurs, rubs, gallops or clicks.  Gastrointestinal system: Abdomen is nondistended, soft and nontender.  Extremities: No pedal edema. Skin: No rashes, lesions or ulcers Psychiatry: Mood and affect appear to be depressed.   Data Reviewed:    Labs: Basic Metabolic Panel: Recent Labs  Lab 01/15/22 0714 01/15/22 1622 01/16/22 0403 01/17/22 0544 01/18/22 0346 01/19/22 0405 01/20/22 0500  NA 144   < > 142 136 136 138 139  K 4.2   < > 3.6 4.3 4.0 4.0 4.0  CL 109  --  104 99 96* 94* 93*  CO2 25  --  _0 33* 31  GLUCOSE 124*  --  124* 353* 188* 172* 214*  BUN 53*  --  57* 75* 78* 77* 80*  CREATININE 2.19*  --  2.44* 2.41* 2.14* 2.14* 2.17*  CALCIUM 8.2*  --  7.9* 7.8* 8.0* 8.5* 8.9  MG 2.4  --  2.6*  --  2.7*  --   --   PHOS  --   --  4.6  --  4.8*  --   --    < > = values in this interval not displayed.   GFR Estimated Creatinine Clearance: 37.1 mL/min (A) (by C-G formula based on SCr of 2.17 mg/dL (H)). Liver Function Tests: Recent Labs  Lab 01/16/22 0403  ALBUMIN <1.5*   No results for input(s): "LIPASE", "AMYLASE" in the last 168 hours. No results for input(s): "AMMONIA" in the last 168 hours. Coagulation profile No results for input(s): "INR", "PROTIME" in the last 168 hours. COVID-19 Labs  No results for input(s): "DDIMER", "FERRITIN", "LDH", "CRP" in the last 72 hours.  Lab Results  Component Value Date   SARSCOV2NAA NEGATIVE 01/08/2022   [place] NEGATIVE 06/18/2021   [place] NEGATIVE 06/18/2020   [place] NEGATIVE 12/31/2019    CBC: Recent Labs  Lab 01/16/22 0403 01/17/22 0544 01/18/22 0346 01/19/22 0405 01/20/22 0500  WBC 18.8* 14.6* 14.2* 19.0* 20.3*  HGB 7.0* 7.1* 7.6* 8.0* 8.1*  HCT 22.0* 22.4* 23.4* 25.2* 24.8*  MCV 95.2 94.9 92.5 92.0 89.9  PLT 309 335 408* 506*  583*   Cardiac Enzymes: No results for input(s): "CKTOTAL", "CKMB", "CKMBINDEX", "TROPONINI" in the last 168 hours. BNP (last 3 results) No results for input(s): "PROBNP" in the last 8760 hours. CBG: Recent Labs  Lab 01/20/22 1416 01/20/22 1631 01/20/22 1938 01/21/22 0011 01/21/22 0339  GLUCAP 279* 195* 231* 335* 300*   D-Dimer: No results for input(s): "DDIMER" in the last 72 hours. Hgb A1c: No results for input(s): "HGBA1C" in the last 72 hours. Lipid Profile: No results for input(s): "CHOL", "HDL", "LDLCALC", "TRIG", "CHOLHDL", "LDLDIRECT"  in the last 72 hours. Thyroid function studies: No results for input(s): "TSH", "T4TOTAL", "T3FREE", "THYROIDAB" in the last 72 hours.  Invalid input(s): "FREET3" Anemia work up: No results for input(s): "VITAMINB12", "FOLATE", "FERRITIN", "TIBC", "IRON", "RETICCTPCT" in the last 72 hours. Sepsis Labs: Recent Labs  Lab 01/17/22 0544 01/18/22 0346 01/19/22 0405 01/20/22 0500  WBC 14.6* 14.2* 19.0* 20.3*   Microbiology Recent Results (from the past 240 hour(s))  Culture, blood (Routine X 2) w Reflex to ID Panel     Status: None   Collection Time: 01/15/22  7:14 AM   Specimen: BLOOD LEFT HAND  Result Value Ref Range Status   Specimen Description BLOOD LEFT HAND  Final   Special Requests   Final    BOTTLES DRAWN AEROBIC AND ANAEROBIC Blood Culture adequate volume   Culture   Final    NO GROWTH 5 DAYS Performed at [facility], [place] [address], [place], [place] [address]    Report Status 01/20/2022 FINAL  Final  Culture, blood (Routine X 2) w Reflex to ID Panel     Status: None   Collection Time: 01/15/22  7:14 AM   Specimen: BLOOD RIGHT HAND  Result Value Ref Range Status   Specimen Description BLOOD RIGHT HAND  Final   Special Requests   Final    BOTTLES DRAWN AEROBIC AND ANAEROBIC Blood Culture adequate volume   Culture   Final    NO GROWTH 5 DAYS Performed at [facility], [place] [address] [place], [place]  [address]    Report Status 01/20/2022 FINAL  Final  Culture, BAL-quantitative w Gram Stain     Status: Abnormal   Collection Time: 01/15/22  3:09 PM   Specimen: Bronchoalveolar Lavage; Respiratory  Result Value Ref Range Status   Specimen Description BRONCHIAL ALVEOLAR LAVAGE  Final   Special Requests NONE  Final   Gram Stain   Final    RARE WBC PRESENT,BOTH PMN AND MONONUCLEAR NO ORGANISMS SEEN    Culture (A)  Final    1,000 COLONIES/mL STAPHYLOCOCCUS EPIDERMIDIS CALL MICROBIOLOGY LAB IF SENSITIVITIES ARE REQUIRED. Performed at [facility], [place] [address], [place], [place] [address]    Report Status 01/18/2022 FINAL  Final  Culture, fungus without smear     Status: None (Preliminary result)   Collection Time: 01/15/22  3:09 PM   Specimen: Bronchoalveolar Lavage; Other  Result Value Ref Range Status   Specimen Description BRONCHIAL ALVEOLAR LAVAGE  Final   Special Requests NONE  Final   Culture   Final    NO FUNGUS ISOLATED AFTER 3 DAYS Performed at [facility], [address], [place], [place] [address]    Report Status PENDING  Incomplete  Aerobic/Anaerobic Culture w Gram Stain (surgical/deep wound)     Status: None (Preliminary result)   Collection Time: 01/16/22 11:48 AM   Specimen: Bone; Tissue  Result Value Ref Range Status   Specimen Description BONE  Final   Special Requests 4TH METATARSAL LEFT FOOT  Final   Gram Stain NO WBC SEEN NO ORGANISMS SEEN   Final   Culture   Final    NO GROWTH 4 DAYS NO ANAEROBES ISOLATED; CULTURE IN PROGRESS FOR 5 DAYS Performed at [facility], [place] [address], [place], [place] [address]    Report Status PENDING  Incomplete     Medications:    arformoterol  15 mcg Nebulization BID   atorvastatin  40 mg Per Tube Daily  budesonide (PULMICORT) nebulizer solution  0.5 mg Nebulization BID   Chlorhexidine Gluconate Cloth  6 each Topical Daily   docusate  100 mg Per Tube BID   free water  150 mL Per Tube Q4H    furosemide  40 mg Oral Daily   hydrALAZINE  12.5 mg Per Tube Q8H   insulin aspart  0-20 Units Subcutaneous Q4H   insulin aspart  3 Units Subcutaneous Q4H   insulin detemir  20 Units Subcutaneous BID   isosorbide mononitrate  20 mg Per Tube BID   levothyroxine  75 mcg Per Tube Q0600   melatonin  3 mg Per Tube QHS   metoprolol tartrate  25 mg Per Tube BID   nicotine  21 mg Transdermal Daily   mouth rinse  15 mL Mouth Rinse Q2H   pantoprazole  40 mg Per Tube Daily   revefenacin  175 mcg Nebulization Daily   ticagrelor  90 mg Oral BID   Or   ticagrelor  90 mg Per Tube BID   Continuous Infusions:  feeding supplement (GLUCERNA 1.5 CAL) 1,000 mL (01/20/22 1509)   heparin 1,500 Units/hr (01/20/22 2213)      LOS: 13 days   Charlynne Cousins  [facility]  01/21/2022, 8:03 AM

## 2022-01-21 NOTE — Progress Notes (Signed)
Modified Barium Swallow Progress Note  Patient Details  Name: Lauren [name] MRN: [id number] Date of Birth: 05-04-1980  Today's Date: 01/21/2022  Modified Barium Swallow completed.  Full report located under Chart Review in the Imaging Section.  Brief recommendations include the following:  Clinical Impression  Pt presents with oropharyngeal dysphagia characterized by reduced labial seal, weak lingual manipulation, impaired posterior propulsion, impaired mastication, reduced bolus cohesion, reduced tongue base retraction, and a pharyngeal delay. She demonstrated left-sided anterior spillage of liquids, prolonged mastication, oral residue, delayed oral transit with intermittent lingual pumping, premature spillage, , mild vallecular residue, and swallow initiation with liquids at the level of the valleculae and pyriform sinuses. Penetration (PAS 3, 5) was noted with thin and occasionally with nectar thick liquids. Penetration often progressed to aspiration (PAS 7) with thin liquids and once with consecutive swallows of nectar thick liquids. Pt demonstrated a strong cough in response to larger amounts of aspirated material which appeared effective in expelling the aspirant. Laryngeal invasion was secondary to impaired timing of laryngeal closure. A chin tuck posture was ineffective in eliminating laryngeal invasion of liquids, and pt exhibited difficulty performing this or prompted coughing to attempt expulsion of penetrated material. A dysphagia 2 diet with nectar thick liquids is recommended at this time with observance of swallowing precautions. SLP will continue to follow pt.   Swallow Evaluation Recommendations       SLP Diet Recommendations: Dysphagia 2 (Fine chop) solids;Nectar thick liquid   Liquid Administration via: Cup;Straw   Medication Administration: Crushed with puree   Supervision: Full assist for feeding;Full supervision/cueing for compensatory strategies   Compensations: Slow  rate;Small sips/bites;Follow solids with liquid   Postural Changes: Seated upright at 90 degrees   Oral Care Recommendations: Oral care BID      Jadier Rockers I. Hardin Negus, [place], [place] Office number [phone]   Horton Marshall 01/21/2022,3:32 PM

## 2022-01-21 NOTE — Progress Notes (Signed)
[place] for heparin Indication: arterial clot/ischemic arm s/p revas by vascular  Allergies  Allergen Reactions   Wellbutrin [Bupropion] Hives   Cefepime Rash    Tolerates penicilllin   Ciprofloxacin Hcl Hives and Rash    Hives/rash at injection site; 01/15/22 tolerated IV cipro   Tape Rash    Patient Measurements: Height: 5\' 5"  (165.1 cm) Weight: 83.5 kg (184 lb 1.4 oz) IBW/kg (Calculated) : 57 Heparin Dosing Weight: 77kg  Vital Signs: Temp: 98 F (36.7 C) (09/21 1210) Temp Source: Oral (09/21 1210) BP: 114/78 (09/21 1210) Pulse Rate: 87 (09/21 1210)  Labs: Recent Labs    01/19/22 0405 01/20/22 0500 01/20/22 0640 01/21/22 0443 01/21/22 0830  HGB 8.0* 8.1*  --   --   --   HCT 25.2* 24.8*  --   --   --   PLT 506* 583*  --   --   --   HEPARINUNFRC 0.42  --  0.55 0.56  --   CREATININE 2.14* 2.17*  --   --  2.42*     Estimated Creatinine Clearance: 33.3 mL/min (A) (by C-G formula based on SCr of 2.42 mg/dL (H)).   Assessment: 43 yoF admitted with R MCA vertebral stroke s/p thrombectomy and stent placement. Pt was on apixaban prior to admit. She has been maintained on IV heparin since admission. Hematology consulted for hypercoagulable work-up which has been negative. They recommend discharging on enoxaparin. Pharmacy consulted to transition off heparin.  Renal function has been variable and is close to the cut-off for once daily dosing. UOP has been high at > 1 mL/kg/hr which is reassuring. CrCl currently 33 mL/min. CBC is stable.   Goal of Therapy:  Anti-Xa level 0.6-1 units/ml 4hrs after LMWH dose given Monitor platelets by anticoagulation protocol: Yes   Plan:  Stop heparin infusion at 2000 Start enoxaparin 80mg  BID at 2000 Recommend close monitoring of Anti-Xa levels given fluctuating renal function. BMP ordered for 9/22 to assess renal function.  Erskine Speed, PharmD Clinical Pharmacist 01/21/2022 6:13 PM

## 2022-01-22 DIAGNOSIS — I6521 Occlusion and stenosis of right carotid artery: Secondary | ICD-10-CM | POA: Diagnosis not present

## 2022-01-22 DIAGNOSIS — R531 Weakness: Secondary | ICD-10-CM

## 2022-01-22 DIAGNOSIS — J9601 Acute respiratory failure with hypoxia: Secondary | ICD-10-CM | POA: Diagnosis not present

## 2022-01-22 DIAGNOSIS — I639 Cerebral infarction, unspecified: Secondary | ICD-10-CM | POA: Diagnosis not present

## 2022-01-22 DIAGNOSIS — N179 Acute kidney failure, unspecified: Secondary | ICD-10-CM | POA: Diagnosis not present

## 2022-01-22 LAB — CBC
HCT: 22.5 % — ABNORMAL LOW (ref 36.0–46.0)
Hemoglobin: 7.3 g/dL — ABNORMAL LOW (ref 12.0–15.0)
MCH: 29 pg (ref 26.0–34.0)
MCHC: 32.4 g/dL (ref 30.0–36.0)
MCV: 89.3 fL (ref 80.0–100.0)
Platelets: 734 10*3/uL — ABNORMAL HIGH (ref 150–400)
RBC: 2.52 MIL/uL — ABNORMAL LOW (ref 3.87–5.11)
RDW: 14.5 % (ref 11.5–15.5)
WBC: 23.5 10*3/uL — ABNORMAL HIGH (ref 4.0–10.5)
nRBC: 0.1 % (ref 0.0–0.2)

## 2022-01-22 LAB — BASIC METABOLIC PANEL
Anion gap: 14 (ref 5–15)
BUN: 89 mg/dL — ABNORMAL HIGH (ref 6–20)
CO2: 28 mmol/L (ref 22–32)
Calcium: 8.8 mg/dL — ABNORMAL LOW (ref 8.9–10.3)
Chloride: 94 mmol/L — ABNORMAL LOW (ref 98–111)
Creatinine, Ser: 2.62 mg/dL — ABNORMAL HIGH (ref 0.44–1.00)
GFR, Estimated: 23 mL/min — ABNORMAL LOW (ref >60)
Glucose, Bld: 196 mg/dL — ABNORMAL HIGH (ref 70–99)
Potassium: 4.5 mmol/L (ref 3.5–5.1)
Sodium: 136 mmol/L (ref 135–145)

## 2022-01-22 LAB — GLUCOSE, CAPILLARY
Glucose-Capillary: 181 mg/dL — ABNORMAL HIGH (ref 70–99)
Glucose-Capillary: 173 mg/dL — ABNORMAL HIGH (ref 70–99)
Glucose-Capillary: 164 mg/dL — ABNORMAL HIGH (ref 70–99)
Glucose-Capillary: 178 mg/dL — ABNORMAL HIGH (ref 70–99)
Glucose-Capillary: 153 mg/dL — ABNORMAL HIGH (ref 70–99)

## 2022-01-22 MED ORDER — METOPROLOL SUCCINATE ER 25 MG PO TB24: 25.0000 mg | ORAL_TABLET | Freq: Once | ORAL | Status: DC

## 2022-01-22 MED ORDER — METOPROLOL SUCCINATE ER 50 MG PO TB24
75.0000 mg | ORAL_TABLET | Freq: Every day | ORAL | Status: DC
  Administered 2022-01-23 – 2022-01-25 (×3): 75 mg via ORAL
  Filled 2022-01-22 (×4): qty 1

## 2022-01-22 MED ORDER — FREE WATER
100.0000 mL | Status: DC
  Administered 2022-01-22 – 2022-01-27 (×32): 100 mL

## 2022-01-22 NOTE — Progress Notes (Addendum)
Rounding Note    Patient Name: Lauren [name] Date of Encounter: 01/22/2022  [place] Cardiologist: Lauren Dolly, MD   Subjective   No acute overnight events. Patient is doing well this morning. Only complain is left foot pain. No chest pain, shortness of breath, or palpitations. Son states this is the best she has looked since she has been here. Her creatinine and BUN do continue to rise and hemoglobin dropped to 7.3 this morning. Her left foot has continues sanguineous drainage but no other abnormal bleeding.  Inpatient Medications    Scheduled Meds:  arformoterol  15 mcg Nebulization BID   atorvastatin  40 mg Per Tube Daily   budesonide (PULMICORT) nebulizer solution  0.5 mg Nebulization BID   Chlorhexidine Gluconate Cloth  6 each Topical Daily   docusate  100 mg Per Tube BID   enoxaparin (LOVENOX) injection  80 mg Subcutaneous Q12H   free water  100 mL Per Tube Q4H   hydrALAZINE  12.5 mg Per Tube Q8H   insulin aspart  0-20 Units Subcutaneous Q4H   insulin aspart  6 Units Subcutaneous Q4H   insulin detemir  30 Units Subcutaneous BID   isosorbide mononitrate  20 mg Per Tube BID   levothyroxine  75 mcg Per Tube Q0600   melatonin  3 mg Per Tube QHS   metoprolol succinate  50 mg Oral Daily   nicotine  21 mg Transdermal Daily   pantoprazole  40 mg Per Tube Daily   revefenacin  175 mcg Nebulization Daily   ticagrelor  90 mg Oral BID   Or   ticagrelor  90 mg Per Tube BID   Continuous Infusions:  feeding supplement (GLUCERNA 1.5 CAL) 55 mL/hr at 01/22/22 0838   PRN Meds: acetaminophen **OR** acetaminophen (TYLENOL) oral liquid 160 mg/5 mL **OR** acetaminophen, docusate, influenza vac split quadrivalent PF, ipratropium-albuterol, labetalol, mouth rinse, pneumococcal 20-valent conjugate vaccine, polyethylene glycol, senna-docusate   Vital Signs    Vitals:   01/22/22 0500 01/22/22 0810 01/22/22 0811 01/22/22 0828  BP:    130/72  Pulse:      Resp:    20   Temp:    98 F (36.7 C)  TempSrc:    Oral  SpO2:  97% 98%   Weight: 83.9 kg     Height:        Intake/Output Summary (Last 24 hours) at 01/22/2022 1030 Last data filed at 01/22/2022 0838 Gross per 24 hour  Intake 2013.83 ml  Output 700 ml  Net 1313.83 ml      01/22/2022    5:00 AM 01/21/2022    4:24 AM 01/19/2022    5:00 AM  Last 3 Weights  Weight (lbs) 184 lb 15.5 oz 184 lb 1.4 oz 178 lb 2.1 oz  Weight (kg) 83.9 kg 83.5 kg 80.8 kg      Telemetry    Normal sinus rhythm with rates in the 90s to low 100s. - Personally Reviewed  ECG    No new ECG tracing today. - Personally Reviewed  Physical Exam   GEN: No acute distress.   Neck: No JVD. Cardiac: Tachycardic with normal rhythm. No murmurs, rubs, or gallops.  Respiratory: Clear to auscultation bilaterally. No significant wheezes, rhonchi, or rales appreciated. GI: Soft, non-distended, and non-tender. MS: No lower extremity edema. Left foot wrapped in ACE bandage with sanguinous drainage. Skin: Warm and dry. Neuro:  Dysarthria improving. Left side hemiplegia.  Psych: Normal affect. Responds appropriately.  Labs  High Sensitivity Troponin:   Recent Labs  Lab 01/08/22 1920 01/11/22 1001 01/12/22 0027 01/12/22 0203 01/13/22 1059  TROPONINIHS 860* 3,869* 3,807* 3,512* 2,880*     Chemistry Recent Labs  Lab 01/16/22 0403 01/17/22 0544 01/18/22 0346 01/19/22 0405 01/20/22 0500 01/21/22 0830 01/22/22 0443  NA 142   < > 136   < > 139 136 136  K 3.6   < > 4.0   < > 4.0 4.3 4.5  CL 104   < > 96*   < > 93* 94* 94*  CO2 26   < > 27   < > 31 30 28   GLUCOSE 124*   < > 188*   < > 214* 223* 196*  BUN 57*   < > 78*   < > 80* 87* 89*  CREATININE 2.44*   < > 2.14*   < > 2.17* 2.42* 2.62*  CALCIUM 7.9*   < > 8.0*   < > 8.9 8.6* 8.8*  MG 2.6*  --  2.7*  --   --   --   --   ALBUMIN <1.5*  --   --   --   --   --   --   GFRNONAA 25*   < > 29*   < > 29* 25* 23*  ANIONGAP 12   < > 13   < > 15 12 14    < > = values in  this interval not displayed.    Lipids  Recent Labs  Lab 01/16/22 0403  TRIG 197*    Hematology Recent Labs  Lab 01/19/22 0405 01/20/22 0500 01/22/22 0443  WBC 19.0* 20.3* 23.5*  RBC 2.74* 2.76* 2.52*  HGB 8.0* 8.1* 7.3*  HCT 25.2* 24.8* 22.5*  MCV 92.0 89.9 89.3  MCH 29.2 29.3 29.0  MCHC 31.7 32.7 32.4  RDW 13.8 13.7 14.5  PLT 506* 583* 734*   Thyroid No results for input(s): "TSH", "FREET4" in the last 168 hours.  BNPNo results for input(s): "BNP", "PROBNP" in the last 168 hours.  DDimer No results for input(s): "DDIMER" in the last 168 hours.   Radiology    DG Swallowing Func-Speech Pathology  Result Date: 01/21/2022 Table formatting from the original result was not included. Objective Swallowing Evaluation: Type of Study: MBS-Modified Barium Swallow Study  Patient Details Name: Lauren [name] MRN: [id number] Date of Birth: Mar 01, 1981 Today's Date: 01/21/2022 Time: SLP Start Time (ACUTE ONLY): 1400 -SLP Stop Time (ACUTE ONLY): 1424 SLP Time Calculation (min) (ACUTE ONLY): 24 min Past Medical History: Past Medical History: Diagnosis Date  Anemia   CAD (coronary artery disease)   a. s/p cath in 03/2014 showing 30% mid-LAD, moderate to severe disease along small D1, patent LCx, moderate to severe distal OM2 stenosis and moderate diffuse diease along RCA not amenable to PCI  CHF (congestive heart failure) ([place])   a. EF 55-60% in 12/2019 b. EF at 35-40% by echo in 05/2020  CKD (chronic kidney disease) stage 4, GFR 15-29 ml/min (HCC)   Diabetes mellitus without complication ([place])   Myocardial infarction ([place])   Neuropathy   Stroke [facility])  Past Surgical History: Past Surgical History: Procedure Laterality Date  AMPUTATION Left 09/02/2021  Procedure: AMPUTATION RAY;  Surgeon: Lauren [name], DPM;  Location: [place];  Service: Podiatry;  Laterality: Left;  sagittal saw, 3L bag saline & Pulse  Cardiac catherization    CHOLECYSTECTOMY    I & D EXTREMITY Left 08/30/2021  Procedure: IRRIGATION AND  DEBRIDEMENT WITH BONE BIOPSY;  Surgeon: Lauren [name], DPM;  Location: [place];  Service: Podiatry;  Laterality: Left;  IR ANGIO VERTEBRAL SEL SUBCLAVIAN INNOMINATE UNI L MOD SED  01/18/2022  IR CT HEAD LTD  01/08/2022  IR INTRA CRAN STENT  01/08/2022  IR PERCUTANEOUS ART THROMBECTOMY/INFUSION INTRACRANIAL INC DIAG ANGIO  01/08/2022  IRRIGATION AND DEBRIDEMENT FOOT Left 09/04/2021  Procedure: IRRIGATION AND DEBRIDEMENT FOOT AND CLOSURE;  Surgeon: Lauren [name], DPM;  Location: [place];  Service: Podiatry;  Laterality: Left;  RADIOLOGY WITH ANESTHESIA N/A 01/08/2022  Procedure: IR WITH ANESTHESIA;  Surgeon: Radiologist, Medication, MD;  Location: [place];  Service: Radiology;  Laterality: N/A;  THROMBECTOMY BRACHIAL ARTERY Right 11/18/2021  Procedure: RIGHT BRACHIAL, RADIAL, & ULNAR ARTERY THROMBECTOMY.;  Surgeon: Marty Heck, MD;  [facility];  Service: Vascular;  Laterality: Right;  TUBAL LIGATION   HPI: Yousra Ivens is a 41 y.o. female who presented to the ED for acute onset of chest pain with left side weakness. CT head right MCA and vertebral stroke; underwent IR thrombectomy of R ICA with R ICA dissection repair and stent placement with TICI 3. Intubated 9/8-10.  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 She had to be reintubated on 01/15/22 and extubated 01/18/22.  Subjective: alert for brief moments  Recommendations for follow up therapy are one component of a multi-disciplinary discharge planning process, led by the attending physician.  Recommendations may be updated based on patient status, additional functional criteria and insurance authorization. Assessment / Plan / Recommendation   01/21/2022   3:21 PM Clinical Impressions  Clinical Impression Pt presents with oropharyngeal dysphagia characterized by reduced labial seal, weak lingual manipulation, impaired posterior propulsion, impaired mastication, reduced bolus cohesion, reduced tongue base retraction, and a pharyngeal delay. She demonstrated left-sided anterior spillage of liquids, prolonged mastication, oral residue, delayed oral transit with intermittent lingual pumping, premature spillage, , mild vallecular residue, and swallow initiation with liquids at the level of the valleculae and pyriform sinuses. Penetration (PAS 3, 5) was noted with thin and occasionally with nectar thick liquids. Penetration often progressed to aspiration (PAS 7) with thin liquids and once with consecutive swallows of nectar thick liquids. Pt demonstrated a strong cough in response to larger amounts of aspirated material which appeared effective in expelling the aspirant. Laryngeal invasion was secondary to impaired timing of laryngeal closure. A chin tuck posture was ineffective in eliminating laryngeal invasion of liquids, and pt exhibited difficulty performing this or prompted coughing to attempt expulsion of penetrated material. A dysphagia 2 diet with nectar thick liquids is recommended at this time with observance of swallowing precautions. SLP will continue to follow pt. SLP Visit Diagnosis Dysphagia, pharyngeal phase (R13.13) Impact on safety and function Mild aspiration risk     01/21/2022   3:21 PM Treatment Recommendations Treatment Recommendations Therapy as outlined in treatment plan below     01/21/2022   3:21 PM Prognosis Prognosis for Safe Diet Advancement Good Barriers to Reach Goals Cognitive deficits   01/21/2022   3:21 PM Diet Recommendations SLP Diet Recommendations Dysphagia 2 (Fine chop) solids;Nectar thick liquid Liquid Administration via Cup;Straw Medication Administration Crushed with puree Compensations Slow rate;Small sips/bites;Follow solids with liquid Postural Changes Seated  upright at 90 degrees     01/21/2022  3:21 PM Other Recommendations Oral Care Recommendations Oral care BID Follow Up Recommendations Acute inpatient rehab (3hours/day) Assistance recommended at discharge Frequent or constant Supervision/Assistance Functional Status Assessment Patient has had a recent decline in their functional status and demonstrates the ability to make significant improvements in function in a reasonable and predictable amount of time.   01/21/2022   3:21 PM Frequency and Duration  Speech Therapy Frequency (ACUTE ONLY) min 2x/week Treatment Duration 2 weeks     01/21/2022   3:21 PM Oral Phase Oral Phase Impaired Oral - Honey Cup Weak lingual manipulation;Lingual pumping;Left anterior bolus loss;Reduced posterior propulsion;Delayed oral transit;Lingual/palatal residue;Decreased bolus cohesion;Premature spillage Oral - Nectar Cup Weak lingual manipulation;Lingual pumping;Left anterior bolus loss;Reduced posterior propulsion;Delayed oral transit;Lingual/palatal residue;Decreased bolus cohesion;Premature spillage Oral - Nectar Straw Weak lingual manipulation;Lingual pumping;Left anterior bolus loss;Reduced posterior propulsion;Delayed oral transit;Lingual/palatal residue;Decreased bolus cohesion;Premature spillage Oral - Thin Cup Weak lingual manipulation;Lingual pumping;Left anterior bolus loss;Reduced posterior propulsion;Delayed oral transit;Lingual/palatal residue;Decreased bolus cohesion;Premature spillage Oral - Thin Straw Weak lingual manipulation;Lingual pumping;Left anterior bolus loss;Reduced posterior propulsion;Delayed oral transit;Lingual/palatal residue;Decreased bolus cohesion;Premature spillage Oral - Puree Weak lingual manipulation;Lingual pumping;Left anterior bolus loss;Reduced posterior propulsion;Delayed oral transit;Lingual/palatal residue;Decreased bolus cohesion;Premature spillage Oral - Mech Soft Lingual pumping;Left anterior bolus loss;Reduced posterior propulsion;Delayed oral  transit;Lingual/palatal residue;Decreased bolus cohesion;Premature spillage;Impaired mastication;Weak lingual manipulation Oral - Regular Weak lingual manipulation;Lingual pumping;Left anterior bolus loss;Reduced posterior propulsion;Delayed oral transit;Lingual/palatal residue;Decreased bolus cohesion;Premature spillage;Impaired mastication Oral - Pill Weak lingual manipulation;Lingual pumping;Left anterior bolus loss;Reduced posterior propulsion;Delayed oral transit;Lingual/palatal residue;Decreased bolus cohesion;Premature spillage;Impaired mastication    01/21/2022   3:21 PM Pharyngeal Phase Pharyngeal Phase Impaired Pharyngeal- Honey Cup Delayed swallow initiation-vallecula;Reduced tongue base retraction;Pharyngeal residue - valleculae Pharyngeal- Nectar Cup Reduced tongue base retraction;Pharyngeal residue - valleculae;Delayed swallow initiation-pyriform sinuses;Penetration/Aspiration during swallow Pharyngeal Material enters airway, remains ABOVE vocal cords and not ejected out Pharyngeal- Nectar Straw Reduced tongue base retraction;Pharyngeal residue - valleculae;Delayed swallow initiation-pyriform sinuses;Penetration/Aspiration during swallow Pharyngeal Material enters airway, remains ABOVE vocal cords and not ejected out Pharyngeal- Thin Cup Reduced tongue base retraction;Pharyngeal residue - valleculae;Delayed swallow initiation-pyriform sinuses;Penetration/Aspiration during swallow Pharyngeal Material enters airway, remains ABOVE vocal cords and not ejected out;Material enters airway, CONTACTS cords and not ejected out Pharyngeal- Thin Straw Reduced tongue base retraction;Pharyngeal residue - valleculae;Delayed swallow initiation-pyriform sinuses;Penetration/Aspiration during swallow Pharyngeal Material enters airway, remains ABOVE vocal cords and not ejected out;Material enters airway, CONTACTS cords and not ejected out Pharyngeal- Puree Reduced tongue base retraction;Pharyngeal residue -  valleculae;Delayed swallow initiation-vallecula Pharyngeal- Mechanical Soft Reduced tongue base retraction;Pharyngeal residue - valleculae;Delayed swallow initiation-vallecula Pharyngeal- Regular Reduced tongue base retraction;Pharyngeal residue - valleculae;Delayed swallow initiation-vallecula Pharyngeal- Pill Reduced tongue base retraction;Pharyngeal residue - valleculae;Delayed swallow initiation-vallecula    01/21/2022   3:21 PM Cervical Esophageal Phase  Cervical Esophageal Phase [facility] Shanika I. Hardin Negus, [place], [place] Office number [phone] Horton Marshall 01/21/2022, 3:39 PM                      Cardiac Studies   Echocardiogram with Bubble Study 01/10/2022: Impressions:  1. There is akinesis of the mid-to-apical septal LV segments, all apical  segments, and apex. There is significant thinning of the mid-to-apical  septal segments and apex likely represents scarring/infarct. The  basal-to-mid inferior and basal septal  segments are hypokinetic.   2. No LV thrombus visualized on definity imaging.   3. Left ventricular ejection fraction, by estimation, is 30 to 35%. The  left ventricle has moderately decreased function. The left ventricle  demonstrates regional wall motion abnormalities (see scoring  diagram/findings for description). The left  ventricular internal cavity size was mildly-to-moderately dilated. There  is mild concentric left ventricular hypertrophy. Indeterminate diastolic  filling due to E-A fusion.   4. Right ventricular systolic function is normal. The right ventricular  size is normal.   5. The mitral valve is normal in structure. Trivial mitral valve  regurgitation.   6. The aortic valve is tricuspid. Aortic valve regurgitation is not  visualized. No aortic stenosis is present.   7. The inferior vena cava is dilated in size with >50% respiratory  variability, suggesting right atrial pressure of 8 mmHg.   8. Agitated saline contrast  bubble study was negative, with no evidence  of any interatrial shunt.   Comparison(s): Compared to prior TTE on 10/2021, the EF has dropped to  30-35% with similar wall motion.  _______________   Complete Echocardiogram 01/14/2022: Impressions: 1. Left ventricular ejection fraction, by estimation, is 35 to 40%. Left  ventricular ejection fraction by 2D MOD biplane is 37.1 %. The left  ventricle has moderately decreased function. The left ventricle  demonstrates regional wall motion abnormalities  (see scoring diagram/findings for description). There is mild left  ventricular hypertrophy. Left ventricular diastolic parameters are  consistent with Grade II diastolic dysfunction (pseudonormalization).  Elevated left ventricular end-diastolic pressure.  The E/e' is 67. Wall motion abnormality suggestive of LAD territory  infarct or less likely Takatsubo cardiomyopathy.   2. Right ventricular systolic function is normal. The right ventricular  size is normal. Tricuspid regurgitation signal is inadequate for assessing  PA pressure.   3. The mitral valve is abnormal. Trivial mitral valve regurgitation.   4. The inferior vena cava is normal in size with <50% respiratory  variability, suggesting right atrial pressure of 8 mmHg.   5. The aortic valve is tricuspid. Aortic valve regurgitation is not  visualized.   Comparison(s): Changes from prior study are noted. 01/10/2022: LVEF 30-35%,  mid to apical ventricular wall thinning and severe hypokinesis to  akinesis. _______________   ABIs/TBIs 01/14/2022: Summary:  Right: Resting right ankle-brachial index indicates noncompressible right  lower extremity arteries. The right toe-brachial index is abnormal.   Left: Resting left ankle-brachial index indicates noncompressible left  lower extremity arteries. Although ankle-brachial indices are within mild  (0.80-0.94) range, patient history of non-compressible arteries on  previous exams coupled  with abnormal,  monophasic waveforms suggests progression of arterial disease.   Right ABIs appear decreased compared to prior study on 08-23-2021. Left  ABIs appear decreased compared to prior study on 08-23-2021.  Patient Profile     41 y.o. 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 She went immediately to IR for thrombectomy.  Cardiology was consulted on 01/11/2022 for further evaluation of EKG changes and elevated troponin.  Assessment & Plan    NSTEMI vs Demand Ischemia CAD History of moderate to severe disease on cardiac catheterization in 2015 which was treated medically. Patient presented with an acute stroke and was found to have elevated troponin. High-sensitivity troponin peaked at 3,869. Echo shows LVEF of 30-30% with multiple wall motion abnormalities. -  No chest pain. - Being switch from IV Heparin to Lovenox today given upper extremity arterial occlusion. - Continue Brilinta 90mg  twice daily.  - Continue Toprol-XL as as below. - Continue Imdur 20mg  twice daily. - Continue Lipitor 40mg  daily. - Likely demand ischemia from acute stroke and CHF rather than true ACS. Would continue to treat medically for now and can then consider ischemic evaluation as an outpatient once she recovers from her stroke.   Acute on Chronic Combined CHF Initial Echo on 9/8 showed LVEF of 30-35% with akinesis of the mid to apical septal LV segments, all apical segments, and apex as well as significant thinning of the mid to apical septal segments and apex likely representing scarring/infarct.  Repeat Echo on 9/14 showed LVEF of 35-40% with hypokinesis of the mid and distal anterior wall, mid and distal lateral wall, mid and distal anterior septum, entire apex, mid and distal inferior wall, mid anterolateral segment, and mid inferoseptal segment. Initially diuresed with IV Lasix but this was stopped on 01/20/2022. Net negative 3.2 L this admission. Weight down 25lbs since admission. Renal function continues to slowly worsen. Creatinine up to 2.62 today and BUN up to 89. - Does not appear significantly volume overloaded. On exam. - Will hold off on PO Lasix given worsening renal function. - Switched from Lopressor to Toprol-XL 50mg  daily today. Rates in the 90s to 100s today. Will given another dose of Toprol-XL 25mg  this morning and then switch to 75mg  daily tomorrow. - Continue Imdur 20mg  daily twice daily. - NO ACEi/ARB/ARNI or MRA given renal function. - Will not initiate SGLT2 inhibitor at this time given renal function and GFR <30. - Continue to monitor daily weights, strict I/Os, and renal function.  - Wall motion abnormalities consistent with LAD distribution vs  stress-induced cardiomyopathy. Will continue to manage medically at this time. Can consider ischemic evaluation as an outpatient once patient has recovered from acute stroke.    LV Apical Aneurysm Despite repeated studies, no LV thrombus has been identified. Will require lifelong anticoagulation nonetheless given right upper extremity arterial occlusion and stroke. Patient will be discharged on Brilinta and Lovenox given recurrent stroke on Eliquis and Plavix.   Hypertension BP mostly well controlled.  - Will increase Toprol-XL as above for additional rate control. - Continue Hydralazine 12.5mg  three times daily and Imdur 20mg  twice daily.   Hyperlipidemia Lipid panel this admission: Total Cholesterol 85, Triglycerides 154, HDL 15, LDL 39. - Continue Lipitor 40mg  daily.   Type 1 Diabetes Mellitus - Management per  primary team.   Acute on CKD stage IV Baseline creatinine around 2.0 to 2.5.  Creatinine 2.15 on admission but peaked at 4.54 on 9/10. Dropped as low as 2.14 following this but has been trending back up the last few days: 2.14 >> 2.17 >> 2.42 >> 2.62. BUN up to 89 today. - See recommendations for diuresis as above. - Continue to monitor closely.   Right Brachial Artery Occlusion S/p thrombectomy in 10/2021. - On Eliquis prior to admission. Currently on IV Heparin but being switched to Lovenox.   Acute Stroke Admitted with acute stroke with right ICA occlusion s/p thrombectomy. Previous hypercoagulable work-up showed normal results on anticardiolipin and beta2 glycoprotein 1 AB and AT3/prot C/prot S/Prothrombin Gene or Fct V Leyden mutations; equivocal lupus anticoagulant but ANA negative; abnormal homocysteine  on labs from July and August (21.5-21.6) but only in the moderate range. - No evidence of atrial fibrillation on telemetry. - He was on Eliquis and  Plavix at home. Patient will be discharged on Brilinta and Lovenox. - Continue statin. - Hematology consulted and hypercoagulable work-up negative so far.  - Management per Neurology.   Osteomyelitis of Left Foot - Podiatry and ID following.   Acute on Chronic Normocytic Anemia S/p a total of 3 units of PRBCs this admission, most recently 9/10. Felt to be multifactorial. Hemoglobin down from 8.1 on 9/20 to 7.3 today. - Management per primary team.   Outpatient follow-up has been arranged.   Otherwise, per primary team.    For questions or updates, please contact [place] Please consult [email or web address] for contact info under        Signed, Darreld Mclean, PA-C  01/22/2022, 10:30 AM    I have seen and examined the patient along with Darreld Mclean, PA-C .  I have reviewed the chart, notes and new data.  I agree with PA/NP's note.  Key new complaints: making neuro progress Key examination changes: left  hemiparesis, aphasia Key new findings / data: worsening renal parameters  PLAN: No clinical signs of HF/hypervolemia. Hold diuretics until renal parameters are back to baseline.  Sanda Klein, MD, [place] [phone] 01/22/2022, 4:14 PM

## 2022-01-22 NOTE — Progress Notes (Signed)
Occupational Therapy Treatment Patient Details Name: Lauren [name] MRN: [id number] DOB: 04-11-1981 Today's Date: 01/22/2022   History of present illness 41 y/o female presenting 9/8 with gait instability, L-sided facial droop, weakness, and slurred speech. 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 Hospitalization complicated by URI, Rhinovirus positive, acute COPD exacerbation, elevated troponin, possibly due to cardiac injury or Takotsubo cardiomyopathy. L 4th ray bone biopsy 01/16/22, concern for osteomyelitis. Intubated 9/15, extubated 9/18. 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   OT comments  Patient supine in bed when therapist entered room. Complains of back pain and requesting to be pulled up in bed. Once repositioned therapist provided PROM to LUE to maintain ROM - at the same time trying to get patient to acknowledge LUE. Therapist actively assisted left hand to tap patient's face  and patient stated "that's not my hand" multiple times. Therapist provided multiple tactile cues and stimuli to left arm at the same as trying to get patient to look to the left or midline. Patient able to briefly get to midline for 1-2 seconds at a time but not consistently. Therapist assisted patient with ROM in LLE and positioning LE into a more neutral position to reduce hip abduction. Patient was noted to react to painful stimuli in left arm and began scratching it and seemed irritated by the hospital bracelets. Will continue POC.    Recommendations for follow up therapy are one component of a multi-disciplinary discharge planning process, led by the attending physician.  Recommendations may be updated based on patient status,  additional functional criteria and insurance authorization.    Follow Up Recommendations  Acute inpatient rehab (3hours/day)    Assistance Recommended at Discharge    Patient can return home with the following  A lot of help with walking and/or transfers;A lot of help with bathing/dressing/bathroom;Two people to help with walking and/or transfers;Two people to help with bathing/dressing/bathroom;Assistance with cooking/housework;Assistance with feeding;Direct supervision/assist for medications management;Direct supervision/assist for financial management;Assist for transportation;Help with stairs or ramp for entrance   Equipment Recommendations   (defer)    Recommendations for Other Services Rehab consult    Precautions / Restrictions Precautions Precautions: Fall Precaution Comments: L inattention, R gaze preference, L hemiplegia, cortrak; left foot bleeding s/p biopsy Restrictions Weight Bearing Restrictions: No       Mobility Bed Mobility                    Transfers                         Balance                                           ADL either performed or assessed with clinical judgement   ADL                                              Extremity/Trunk Assessment Upper Extremity Assessment Upper Extremity Assessment: RUE deficits/detail;LUE deficits/detail RUE Deficits /  Details: WFL ROM and functional use in supine. Unable to assess strength RUE Sensation: WNL RUE Coordination: WNL LUE Deficits / Details: No intentional active movement thpug some bicep and shoulder extension resistance noted at times with PROM LUE Sensation:  (difficult to assess - responds to painful stimuli. Reacts to itch stimuli as well - scratching arm) LUE Coordination: decreased fine motor;decreased gross motor   Lower Extremity Assessment Lower Extremity Assessment: Defer to PT evaluation        Vision Baseline  Vision/History: 1 Wears glasses Ocular Range of Motion: Impaired-to be further tested in functional context Additional Comments: L neglect - can barely get patient to midline and only for a few seconds before returning to right   Perception     Praxis      Cognition Arousal/Alertness: Awake/alert Behavior During Therapy: Flat affect Overall Cognitive Status: Difficult to assess Area of Impairment: Attention, Following commands, Awareness                   Current Attention Level: Focused   Following Commands: Follows one step commands inconsistently Safety/Judgement: Decreased awareness of safety, Decreased awareness of deficits Awareness: Intellectual Problem Solving: Slow processing, Decreased initiation, Difficulty sequencing, Requires verbal cues, Requires tactile cues General Comments: Oriented to self.        Exercises Other Exercises Other Exercises: PROM to fingers, wrist, elbow and shoulder in order to maintain ROM and prepare for active motor control.    Shoulder Instructions       General Comments      Pertinent Vitals/ Pain       Pain Assessment Pain Assessment: Faces Faces Pain Scale: Hurts little more Pain Location: low back Pain Descriptors / Indicators: Discomfort, Grimacing Pain Intervention(s): Repositioned  Home Living                                          Prior Functioning/Environment              Frequency  Min 2X/week        Progress Toward Goals  OT Goals(current goals can now be found in the care plan section)  Progress towards OT goals: Progressing toward goals  Acute Rehab OT Goals Patient Stated Goal: to eat OT Goal Formulation: With patient Time For Goal Achievement: 01/25/22 Potential to Achieve Goals: Good  Plan Discharge plan remains appropriate    Co-evaluation          OT goals addressed during session: Other (comment) (neuro-reed)      AM-PAC OT "6 Clicks" Daily Activity      Outcome Measure   Help from another person eating meals?: Total Help from another person taking care of personal grooming?: A Lot Help from another person toileting, which includes using toliet, bedpan, or urinal?: Total Help from another person bathing (including washing, rinsing, drying)?: A Lot Help from another person to put on and taking off regular upper body clothing?: A Lot Help from another person to put on and taking off regular lower body clothing?: Total 6 Click Score: 9    End of Session    OT Visit Diagnosis: Unsteadiness on feet (R26.81);Other abnormalities of gait and mobility (R26.89);Muscle weakness (generalized) (M62.81);Hemiplegia and hemiparesis Hemiplegia - Right/Left: Left Hemiplegia - dominant/non-dominant: Dominant Hemiplegia - caused by: Cerebral infarction   Activity Tolerance Patient tolerated treatment well   Patient Left with call bell/phone within reach;with  bed alarm set;in bed   Nurse Communication  (Patient hungry and likely needs to be fed.)        Time: 1345-1411 OT Time Calculation (min): 26 min  Charges: OT General Charges $OT Visit: 1 Visit OT Treatments $Neuromuscular Re-education: 23-37 mins  Lauren [name], OTR/L [place]  Office [phone]   Lauren [name] 01/22/2022, 2:27 PM

## 2022-01-22 NOTE — Progress Notes (Addendum)
[facility]  PROGRESS NOTE  Lauren [name] WYO:[id number] DOB: [birth year] DOA: 01/08/2022 PCP: Lemmie Evens, MD   Brief HPI:   41 yr old female with a history of CAD, chronic diastolic heart failure, diabetes mellitus type 2 with peripheral neuropathy with recent arterial thrombectomy to the right upper extremity due to limb ischemia, CKD stage IIIa, peripheral arterial disease was admitted with right MCA CVA and V3 segment of the right vertebral artery stenosis with left ICA stenosis.  She was initially admitted under PCCM service, extubated on 01/18/2022 and transferred to [facility] on 01/21/[facility]22.  Significant Events: 9/8 Admitted with left sided weakness, facial droop.  NIHSS 8, to IR  for thrombectomy, intubated.  MRSA PCR +, Rhinovirus + 9/10 PSV wean, moving RUE/RLE, extubated  9/11 ECHO>, CT head negative for acute changes, Trach asp negative,  9/13 started on HFNC 10L overnight 9/14 tmax 101.2, remains on HFNC, MRI, Heme/onc and ID consult 9/18 extubated, infectious disease change antimicrobial coverage to cefadroxil and Flagyl 9/19: Required Precedex overnight for restlessness.  Off by a.m. rounds   Significant studies: CT head Periventricular white matter hypodensities adjacent to the frontal horn of the right lateral ventricles and in the right corona radiata are new since prior exam. This may represent acute ischemia. CTA head & neck - Age indeterminate occlusion of the proximal right ICA in the neck with non opacification distally in the upper neck and intracranially. Right MCA and A1 ACA are patent, but diminutive. Severe left paraclinoid ICA stenosis. CT perfusion, no evidence of core infarct or penumbra; however, there is approximately 38 mL of T-max greater than 4 seconds in the right MCA territory which suggests possible oligemia. IR with TICI3 of intracranial R ICA, however complicated by ICA dissection status post telescope stents Post IR CT - Redemonstrated hypodensity in  the right frontal WM MRI x 2 Extensive patchy areas of acute infarction involving the right MCA territory, most confluent areas along the watershed. MRA  Asymmetric diffused decreased size of right MCA branch vessels compared to the left likely reflects decreased perfusion  Subjective   Patient seen and examined, denies any complaints.   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  -Etiology unclear likely large vessel disease, cannot rule out cardioembolic source -Initially admitted to ICU under PCCM service, had to be intubated -2D echo showed EF of 30% with LDL 39, A1c 8.0 -Hypercoagulable work-up showed abnormal lupus anticoagulant but was also on heparin, slightly increased rheumatoid factor -Plan to repeat labs in 3 months and follow-up hematology as outpatient; hematology has been notified -She was previously on Eliquis and now switched to Lovenox per oncology recommendation, she should be on Lovenox therapeutically.  Patient is also on [place] to go to inpatient rehab  History of limb ischemia/hypercoagulability - recent h/o  right upper extremity ischemia -S/p thrombectomy -Patient was on Eliquis PTA -Switch to Lovenox per pharmacy consult -She will need hematology follow-up as outpatient  Acute respiratory failure with hypoxia/COPD exacerbation/rhinovirus -Resolved -She was initially intubated and admitted under PCCM service, now extubated -She was started on IV Lasix which has been discontinued -Chest x-ray showed right infrahilar opacity which has improved, procalcitonin high at 1.18 -She is currently not requiring oxygen, O2 sats 98% on room air -Continue Brovana, Pulmicort  Acute kidney injury on CKD stage IV -Early contrast-induced nephropathy, creatinine peaked at 5 -  Creatinine is down to 2.62 -Lasix is  currently on hold -Follow BMP in am  Chronic diastolic and systolic heart failure -2D echo showed EF of 75%, grade 2 diastolic dysfunction;With wall motion abnormality to the LAD distribution question obstruction versus stress-induced cardiomyopathy. -Euvolemic -Cardiology was consulted and relates this may be from demand ischemia from CHF, she will need ischemic evaluation as outpatient -She was started on Imdur, metoprolol, statin; continue on Brilinta -No ACE inhibitor/ARB due to renal dysfunction  Hypertension -Patient is currently on metoprolol, Imdur -Blood pressure well controlled  Diabetes mellitus type 1/DKA on admission -DKA has resolved -Continue sliding scale insulin, patient is n.p.o. on tube feeds through NG tube -Continue sliding scale insulin with NovoLog -Continue insulin detemir -CBG well controlled  Hyperlipidemia -Continue fenofibrate, Lipitor  Nutrition/dysphagia -Continue tube feeding  Hypothyroidism -Current Synthroid  Left foot metatarsal osteomyelitis/status post amputation on 09/02/2021 status post bone biopsy on 01/16/2022 diabetic foot ulcer: -Podiatrist was consulted recommended no surgical intervention at this point in time. Recommended to continue cefadroxil and Flagyl, andstop antibiotics if culture is negative. Abx have been stopped. -will require ID follow-up as an outpatient. ID was consulted ; culture from bone biopsy performed on 01/16/2022 negative till date.  Leukocytosis Discussed with ID regarding persistent leukocytosis -Does not seem to be infectious She was on steroids till 9/16, may be reactive Will need to follow cbc   Class 2 obesity due to excess calories without serious comorbidity with body mass index (BMI) of 39.0 to 39.9  in adult: Noted.   Tobacco abuse: Current smoker she has been counseled, nicotine patch was placed.     Medications     arformoterol  15 mcg Nebulization BID   atorvastatin  40 mg Per Tube Daily    budesonide (PULMICORT) nebulizer solution  0.5 mg Nebulization BID   Chlorhexidine Gluconate Cloth  6 each Topical Daily   docusate  100 mg Per Tube BID   enoxaparin (LOVENOX) injection  80 mg Subcutaneous Q12H   free water  100 mL Per Tube Q4H   hydrALAZINE  12.5 mg Per Tube Q8H   insulin aspart  0-20 Units Subcutaneous Q4H   insulin aspart  6 Units Subcutaneous Q4H   insulin detemir  30 Units Subcutaneous BID   isosorbide mononitrate  20 mg Per Tube BID   levothyroxine  75 mcg Per Tube Q0600   melatonin  3 mg Per Tube QHS   metoprolol succinate  50 mg Oral Daily   nicotine  21 mg Transdermal Daily   pantoprazole  40 mg Per Tube Daily   revefenacin  175 mcg Nebulization Daily   ticagrelor  90 mg Oral BID   Or   ticagrelor  90 mg Per Tube BID     Data Reviewed:   CBG:  Recent Labs  Lab 01/21/22 1707 01/21/22 2044 01/21/22 2348 01/22/22 0424 01/22/22 0826  GLUCAP 197* 208* 266* 181* 173*    SpO2: 98 % O2 Flow Rate (L/min): 2 L/min FiO2 (%): 30 %    Vitals:   01/22/22 0500 01/22/22 0810 01/22/22 0811 01/22/22 0828  BP:    130/72  Pulse:      Resp:    20  Temp:    98 F (36.7 C)  TempSrc:    Oral  SpO2:  97% 98%   Weight: 83.9 kg     Height:          Data Reviewed:  Basic Metabolic Panel: Recent Labs  Lab 01/16/22 0403  01/17/22 0544 01/18/22 0346 01/19/22 0405 01/20/22 0500 01/21/22 0830 01/22/22 0443  NA 142   < > 136 138 139 136 136  K 3.6   < > 4.0 4.0 4.0 4.3 4.5  CL 104   < > 96* 94* 93* 94* 94*  CO2 26   < > 27 33* 31 30 28   GLUCOSE 124*   < > 188* 172* 214* 223* 196*  BUN 57*   < > 78* 77* 80* 87* 89*  CREATININE 2.44*   < > 2.14* 2.14* 2.17* 2.42* 2.62*  CALCIUM 7.9*   < > 8.0* 8.5* 8.9 8.6* 8.8*  MG 2.6*  --  2.7*  --   --   --   --   PHOS 4.6  --  4.8*  --   --   --   --    < > = values in this interval not displayed.    CBC: Recent Labs  Lab 01/17/22 0544 01/18/22 0346 01/19/22 0405 01/20/22 0500 01/22/22 0443  WBC 14.6*  14.2* 19.0* 20.3* 23.5*  HGB 7.1* 7.6* 8.0* 8.1* 7.3*  HCT 22.4* 23.4* 25.2* 24.8* 22.5*  MCV 94.9 92.5 92.0 89.9 89.3  PLT 335 408* 506* 583* 734*    LFT Recent Labs  Lab 01/16/22 0403  ALBUMIN <1.5*     Antibiotics: Anti-infectives (From admission, onward)    Start     Dose/Rate Route Frequency Ordered Stop   01/18/22 1330  cefadroxil (DURICEF) capsule 500 mg  Status:  Discontinued        500 mg Per Tube 2 times daily 01/18/22 1231 01/21/22 0759   01/18/22 1330  metroNIDAZOLE (FLAGYL) tablet 500 mg  Status:  Discontinued        500 mg Per Tube Every 12 hours 01/18/22 1231 01/21/22 0759   01/16/22 1700  ciprofloxacin (CIPRO) IVPB 400 mg  Status:  Discontinued        400 mg 200 mL/hr over 60 Minutes Intravenous Every 24 hours 01/16/22 1015 01/18/22 1231   01/16/22 1200  vancomycin (VANCOREADY) IVPB 500 mg/100 mL  Status:  Discontinued        500 mg 100 mL/hr over 60 Minutes Intravenous Every 24 hours 01/16/22 1018 01/18/22 1231   01/16/22 0400  ciprofloxacin (CIPRO) IVPB 200 mg  Status:  Discontinued        200 mg 100 mL/hr over 60 Minutes Intravenous Every 12 hours 01/15/22 1244 01/16/22 1015   01/15/22 1330  vancomycin (VANCOREADY) IVPB 1750 mg/350 mL        1,750 mg 175 mL/hr over 120 Minutes Intravenous  Once 01/15/22 1240 01/15/22 1600   01/15/22 1330  ciprofloxacin (CIPRO) IVPB 200 mg        200 mg 100 mL/hr over 60 Minutes Intravenous  Once 01/15/22 1244 01/15/22 1722   01/15/22 1249  vancomycin variable dose per unstable renal function (pharmacist dosing)  Status:  Discontinued         Does not apply See admin instructions 01/15/22 1249 01/16/22 1019   01/09/22 2245  amoxicillin-clavulanate (AUGMENTIN) 500-125 MG per tablet 500 mg  Status:  Discontinued        1 tablet Per Tube Every 12 hours 01/09/22 2155 01/15/22 0912   01/09/22 2200  amoxicillin-clavulanate (AUGMENTIN) 875-125 MG per tablet 1 tablet  Status:  Discontinued        1 tablet Per Tube Every 12 hours  01/09/22 1055 01/09/22 1630   01/09/22 2200  amoxicillin-clavulanate (AUGMENTIN) 500-125 MG per tablet  500 mg  Status:  Discontinued        1 tablet Oral Every 12 hours 01/09/22 1630 01/09/22 2155   01/09/22 1030  amoxicillin-clavulanate (AUGMENTIN) 875-125 MG per tablet 1 tablet  Status:  Discontinued        1 tablet Oral Every 12 hours 01/09/22 0935 01/09/22 1055   01/08/22 1425  ceFAZolin (ANCEF) 2-4 GM/100ML-% IVPB       Note to Pharmacy: Wandalee Ferdinand J: cabinet override      01/08/22 1425 01/08/22 2035        DVT prophylaxis: Lovenox  Code Status: Full code  Family Communication: No family at bedside   CONSULTS neurology, PCCM   Objective    Physical Examination:   General: Appears in no acute distress Cardiovascular: S1-S2, regular, no murmur auscultated Respiratory: Clear to auscultation bilaterally Abdomen: Abdomen is soft, nontender, no organomegaly Extremities: No edema in the lower extremities Neurologic: Alert, oriented x3, weakness of left upper and lower extremity   Status is: Inpatient:             Oswald Hillock   [facility] If 7PM-7AM, please contact night-coverage at [email or web address], Office  [phone]   01/22/2022, 11:10 AM  LOS: 14 days

## 2022-01-22 NOTE — Progress Notes (Addendum)
Inpatient Rehabilitation Admissions Coordinator   Transfers deferred for the past 2 days due to bleeding through her bandage to her left foot. I await medical clearance to admit to CIR for I have insurance approval , but need patient to be able to participate fully with therapy. Noted increased WBC and Creat this week. I notified Dr Darrick Meigs of my concerns with labs. I will follow up on Monday.  Danne Baxter, RN, MSN Rehab Admissions Coordinator [phone] 01/22/2022 10:31 AM

## 2022-01-22 NOTE — Plan of Care (Signed)
Problem: Education: Goal: Ability to describe self-care measures that may prevent or decrease complications (Diabetes Survival Skills Education) will improve Outcome: Progressing Goal: Individualized Educational Video(s) Outcome: Progressing   Problem: Coping: Goal: Ability to adjust to condition or change in health will improve Outcome: Progressing   Problem: Fluid Volume: Goal: Ability to maintain a balanced intake and output will improve Outcome: Progressing   Problem: Health Behavior/Discharge Planning: Goal: Ability to identify and utilize available resources and services will improve Outcome: Progressing Goal: Ability to manage health-related needs will improve Outcome: Progressing   Problem: Metabolic: Goal: Ability to maintain appropriate glucose levels will improve Outcome: Progressing   Problem: Nutritional: Goal: Maintenance of adequate nutrition will improve Outcome: Progressing Goal: Progress toward achieving an optimal weight will improve Outcome: Progressing   Problem: Skin Integrity: Goal: Risk for impaired skin integrity will decrease Outcome: Progressing   Problem: Tissue Perfusion: Goal: Adequacy of tissue perfusion will improve Outcome: Progressing   Problem: Education: Goal: Understanding of CV disease, CV risk reduction, and recovery process will improve Outcome: Progressing Goal: Individualized Educational Video(s) Outcome: Progressing   Problem: Activity: Goal: Ability to return to baseline activity level will improve Outcome: Progressing   Problem: Cardiovascular: Goal: Ability to achieve and maintain adequate cardiovascular perfusion will improve Outcome: Progressing Goal: Vascular access site(s) Level 0-1 will be maintained Outcome: Progressing   Problem: Health Behavior/Discharge Planning: Goal: Ability to safely manage health-related needs after discharge will improve Outcome: Progressing   Problem: Activity: Goal: Ability to  tolerate increased activity will improve Outcome: Progressing   Problem: Respiratory: Goal: Ability to maintain a clear airway and adequate ventilation will improve Outcome: Progressing   Problem: Role Relationship: Goal: Method of communication will improve Outcome: Progressing   Problem: Education: Goal: Knowledge of General Education information will improve Description: Including pain rating scale, medication(s)/side effects and non-pharmacologic comfort measures Outcome: Progressing   Problem: Health Behavior/Discharge Planning: Goal: Ability to manage health-related needs will improve Outcome: Progressing   Problem: Clinical Measurements: Goal: Ability to maintain clinical measurements within normal limits will improve Outcome: Progressing Goal: Will remain free from infection Outcome: Progressing Goal: Diagnostic test results will improve Outcome: Progressing Goal: Respiratory complications will improve Outcome: Progressing Goal: Cardiovascular complication will be avoided Outcome: Progressing   Problem: Activity: Goal: Risk for activity intolerance will decrease Outcome: Progressing   Problem: Nutrition: Goal: Adequate nutrition will be maintained Outcome: Progressing   Problem: Coping: Goal: Level of anxiety will decrease Outcome: Progressing   Problem: Elimination: Goal: Will not experience complications related to bowel motility Outcome: Progressing Goal: Will not experience complications related to urinary retention Outcome: Progressing   Problem: Pain Managment: Goal: General experience of comfort will improve Outcome: Progressing   Problem: Safety: Goal: Ability to remain free from injury will improve Outcome: Progressing   Problem: Skin Integrity: Goal: Risk for impaired skin integrity will decrease Outcome: Progressing   Problem: Education: Goal: Knowledge of disease or condition will improve Outcome: Progressing Goal: Knowledge of  secondary prevention will improve (SELECT ALL) Outcome: Progressing Goal: Knowledge of patient specific risk factors will improve (INDIVIDUALIZE FOR PATIENT) Outcome: Progressing Goal: Individualized Educational Video(s) Outcome: Progressing   Problem: Coping: Goal: Will verbalize positive feelings about self Outcome: Progressing Goal: Will identify appropriate support needs Outcome: Progressing   Problem: Health Behavior/Discharge Planning: Goal: Ability to manage health-related needs will improve Outcome: Progressing   Problem: Self-Care: Goal: Ability to participate in self-care as condition permits will improve Outcome: Progressing Goal: Verbalization of feelings and concerns over  difficulty with self-care will improve Outcome: Progressing Goal: Ability to communicate needs accurately will improve Outcome: Progressing   Problem: Nutrition: Goal: Risk of aspiration will decrease Outcome: Progressing Goal: Dietary intake will improve Outcome: Progressing

## 2022-01-22 NOTE — Progress Notes (Signed)
Speech Language Pathology Treatment: Dysphagia  Patient Details Name: Lauren [name] MRN: [id number] DOB: [birth year] Today's Date: 01/22/2022 Time: 0175-1025 SLP Time Calculation (min) (ACUTE ONLY): 15 min  Assessment / Plan / Recommendation Clinical Impression  Pt was seen for treatment with her husband, sister, and sister-in-law present. She was alert and cooperative during the session. Pt expressed that she would like to have a sandwich instead of the current food and SLP agreed that this is an excellent goal. Pt tolerated puree and nectar thick liquids via straw without overt s/s of aspiration. Anterior spillage to the left was noted and pt denied sensation of residue on the left labial surface with solids. Trials of pears were administered; a munching masticatory pattern was noted and mastication was prolonged. Despite the prolonged nature of mastication, some boluses appeared inadequately masticated prior to swallowing. A bolus of thin liquid medication via spoon was attempted after trials of pears. Pt exhibited coughing and emesis thereafter. Speech and cognitive-linguistic tasks were deferred to allow pt to be cleaned up after episode of emesis. Pt's current diet of dysphagia 2 solids and nectar thick liquids will be continued and SLP will continue to follow pt.     HPI HPI: Lauren [name] is a 41 y.o. female who presented to the ED for acute onset of chest pain with left side weakness. CT head right MCA and vertebral stroke; underwent IR thrombectomy of R ICA with R ICA dissection repair and stent placement with TICI 3. Intubated 9/8-10.  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 She had to be reintubated on 01/15/22  and extubated 01/18/22.      SLP Plan  Continue with current plan of care      Recommendations for follow up therapy are one component of a multi-disciplinary discharge planning process, led by the attending physician.  Recommendations may be updated based on patient status, additional functional criteria and insurance authorization.    Recommendations  Diet recommendations: Dysphagia 2 (fine chop);Nectar-thick liquid Liquids provided via: Cup;Straw Medication Administration: Crushed with puree Supervision: Staff to assist with self feeding;Full supervision/cueing for compensatory strategies Compensations: Small sips/bites;Slow rate;Follow solids with liquid Postural Changes and/or Swallow Maneuvers: Seated upright 90 degrees                Oral Care Recommendations: Oral care QID;Staff/trained caregiver to provide oral care;Oral care prior to ice chip/H20 Follow Up Recommendations: Acute inpatient rehab (3hours/day) Assistance recommended at discharge: Frequent or constant Supervision/Assistance SLP Visit Diagnosis: Dysphagia, oropharyngeal phase (R13.12) Plan: Continue with current plan of care          Mckinze Poirier I. Hardin Negus, [place], [place] Office number [phone]  Horton Marshall  01/22/2022, 11:09 AM

## 2022-01-22 NOTE — Inpatient Diabetes Management (Signed)
Inpatient Diabetes Program Recommendations  AACE/ADA: New Consensus Statement on Inpatient Glycemic Control   Target Ranges:  Prepandial:   less than 140 mg/dL      Peak postprandial:   less than 180 mg/dL (1-2 hours)      Critically ill patients:  140 - 180 mg/dL    Latest Reference Range & Units 01/22/22 04:24 01/22/22 08:26 01/22/22 11:28  Glucose-Capillary 70 - 99 mg/dL 181 (H) 173 (H) 164 (H)    Latest Reference Range & Units 01/21/22 08:17 01/21/22 12:09 01/21/22 17:07 01/21/22 20:44 01/21/22 23:48  Glucose-Capillary 70 - 99 mg/dL 219 (H) 226 (H) 197 (H) 208 (H) 266 (H)   Review of Glycemic Control  Diabetes history: DM1 (does NOT make any insulin; requires basal, correction, and carbohydrate coverage insulin) Outpatient Diabetes medications: OmniPod insulin pump Current orders for Inpatient glycemic control: Levemir 30 units BID, Novolog 6 units Q4H, Novolog 0-20 units Q4H; Glucerna @ 55 ml/hr  Inpatient Diabetes Program Recommendations:    Insulin: Please consider increasing Levemir to 32 units BID.  Thanks, Barnie Alderman, RN, MSN, [facility] Coordinator Inpatient Diabetes Program [phone] (Team Pager from 8am to [place])

## 2022-01-22 NOTE — Progress Notes (Addendum)
SDOH Interventions Today    Flowsheet Row Most Recent Value  SDOH Interventions   Food Insecurity Interventions Inpatient TOC, Intervention Not Indicated        Spoke to patients spouse and he denies any food insecurities at home. He states they always have food available at home.

## 2022-01-23 DIAGNOSIS — N179 Acute kidney failure, unspecified: Secondary | ICD-10-CM | POA: Diagnosis not present

## 2022-01-23 DIAGNOSIS — I6521 Occlusion and stenosis of right carotid artery: Secondary | ICD-10-CM | POA: Diagnosis not present

## 2022-01-23 DIAGNOSIS — E039 Hypothyroidism, unspecified: Secondary | ICD-10-CM

## 2022-01-23 DIAGNOSIS — M86172 Other acute osteomyelitis, left ankle and foot: Secondary | ICD-10-CM | POA: Diagnosis not present

## 2022-01-23 DIAGNOSIS — I639 Cerebral infarction, unspecified: Secondary | ICD-10-CM | POA: Diagnosis not present

## 2022-01-23 LAB — HEPATIC FUNCTION PANEL
ALT: 28 U/L (ref 0–44)
AST: 33 U/L (ref 15–41)
Albumin: 2.3 g/dL — ABNORMAL LOW (ref 3.5–5.0)
Alkaline Phosphatase: 94 U/L (ref 38–126)
Bilirubin, Direct: <0.1 mg/dL (ref 0.0–0.2)
Total Bilirubin: 0.3 mg/dL (ref 0.3–1.2)
Total Protein: 6.3 g/dL — ABNORMAL LOW (ref 6.5–8.1)

## 2022-01-23 LAB — CBC WITH DIFFERENTIAL/PLATELET
Abs Immature Granulocytes: 0.69 10*3/uL — ABNORMAL HIGH (ref 0.00–0.07)
Basophils Absolute: 0.1 10*3/uL (ref 0.0–0.1)
Basophils Relative: 0 %
Eosinophils Absolute: 0.4 10*3/uL (ref 0.0–0.5)
Eosinophils Relative: 2 %
HCT: 21.7 % — ABNORMAL LOW (ref 36.0–46.0)
Hemoglobin: 7 g/dL — ABNORMAL LOW (ref 12.0–15.0)
Immature Granulocytes: 3 %
Lymphocytes Relative: 13 %
Lymphs Abs: 2.8 10*3/uL (ref 0.7–4.0)
MCH: 29 pg (ref 26.0–34.0)
MCHC: 32.3 g/dL (ref 30.0–36.0)
MCV: 90 fL (ref 80.0–100.0)
Monocytes Absolute: 1.4 10*3/uL — ABNORMAL HIGH (ref 0.1–1.0)
Monocytes Relative: 7 %
Neutro Abs: 15.8 10*3/uL — ABNORMAL HIGH (ref 1.7–7.7)
Neutrophils Relative %: 75 %
Platelets: 801 10*3/uL — ABNORMAL HIGH (ref 150–400)
RBC: 2.41 MIL/uL — ABNORMAL LOW (ref 3.87–5.11)
RDW: 14.5 % (ref 11.5–15.5)
WBC: 21.2 10*3/uL — ABNORMAL HIGH (ref 4.0–10.5)
nRBC: 0.1 % (ref 0.0–0.2)

## 2022-01-23 LAB — BASIC METABOLIC PANEL
Anion gap: 14 (ref 5–15)
Anion gap: 13 (ref 5–15)
BUN: 93 mg/dL — ABNORMAL HIGH (ref 6–20)
BUN: 92 mg/dL — ABNORMAL HIGH (ref 6–20)
CO2: 26 mmol/L (ref 22–32)
CO2: 27 mmol/L (ref 22–32)
Calcium: 8.9 mg/dL (ref 8.9–10.3)
Calcium: 8.9 mg/dL (ref 8.9–10.3)
Chloride: 98 mmol/L (ref 98–111)
Chloride: 100 mmol/L (ref 98–111)
Creatinine, Ser: 2.71 mg/dL — ABNORMAL HIGH (ref 0.44–1.00)
Creatinine, Ser: 2.71 mg/dL — ABNORMAL HIGH (ref 0.44–1.00)
GFR, Estimated: 22 mL/min — ABNORMAL LOW (ref >60)
GFR, Estimated: 22 mL/min — ABNORMAL LOW (ref >60)
Glucose, Bld: 135 mg/dL — ABNORMAL HIGH (ref 70–99)
Glucose, Bld: 165 mg/dL — ABNORMAL HIGH (ref 70–99)
Potassium: 4.6 mmol/L (ref 3.5–5.1)
Potassium: 4.7 mmol/L (ref 3.5–5.1)
Sodium: 138 mmol/L (ref 135–145)
Sodium: 140 mmol/L (ref 135–145)

## 2022-01-23 LAB — CBC
HCT: 21.5 % — ABNORMAL LOW (ref 36.0–46.0)
Hemoglobin: 6.9 g/dL — CL (ref 12.0–15.0)
MCH: 29.4 pg (ref 26.0–34.0)
MCHC: 32.1 g/dL (ref 30.0–36.0)
MCV: 91.5 fL (ref 80.0–100.0)
Platelets: 788 10*3/uL — ABNORMAL HIGH (ref 150–400)
RBC: 2.35 MIL/uL — ABNORMAL LOW (ref 3.87–5.11)
RDW: 14.6 % (ref 11.5–15.5)
WBC: 21.1 10*3/uL — ABNORMAL HIGH (ref 4.0–10.5)
nRBC: 0.1 % (ref 0.0–0.2)

## 2022-01-23 LAB — MAGNESIUM
Magnesium: 3.1 mg/dL — ABNORMAL HIGH (ref 1.7–2.4)
Magnesium: 3.2 mg/dL — ABNORMAL HIGH (ref 1.7–2.4)

## 2022-01-23 LAB — PHOSPHORUS
Phosphorus: 6.9 mg/dL — ABNORMAL HIGH (ref 2.5–4.6)
Phosphorus: 6.9 mg/dL — ABNORMAL HIGH (ref 2.5–4.6)

## 2022-01-23 LAB — TROPONIN I (HIGH SENSITIVITY)
Troponin I (High Sensitivity): 127 ng/L (ref <18)
Troponin I (High Sensitivity): 132 ng/L (ref <18)

## 2022-01-23 LAB — PREPARE RBC (CROSSMATCH)

## 2022-01-23 LAB — GLUCOSE, CAPILLARY
Glucose-Capillary: 137 mg/dL — ABNORMAL HIGH (ref 70–99)
Glucose-Capillary: 137 mg/dL — ABNORMAL HIGH (ref 70–99)
Glucose-Capillary: 145 mg/dL — ABNORMAL HIGH (ref 70–99)
Glucose-Capillary: 160 mg/dL — ABNORMAL HIGH (ref 70–99)
Glucose-Capillary: 167 mg/dL — ABNORMAL HIGH (ref 70–99)
Glucose-Capillary: 192 mg/dL — ABNORMAL HIGH (ref 70–99)

## 2022-01-23 MED ORDER — SODIUM CHLORIDE 0.9% IV SOLUTION: Freq: Once | INTRAVENOUS | Status: AC

## 2022-01-23 MED ORDER — NITROGLYCERIN 0.4 MG SL SUBL
0.4000 mg | SUBLINGUAL_TABLET | SUBLINGUAL | Status: DC | PRN
  Administered 2022-01-23 (×2): 0.4 mg via SUBLINGUAL
  Filled 2022-01-23 (×2): qty 1

## 2022-01-23 MED ORDER — MORPHINE SULFATE (PF) 2 MG/ML IV SOLN
2.0000 mg | Freq: Once | INTRAVENOUS | Status: AC
  Administered 2022-01-23: 2 mg via INTRAVENOUS
  Filled 2022-01-23: qty 1

## 2022-01-23 MED ORDER — METOPROLOL TARTRATE 5 MG/5ML IV SOLN
5.0000 mg | Freq: Once | INTRAVENOUS | Status: AC
  Administered 2022-01-23: 5 mg via INTRAVENOUS
  Filled 2022-01-23: qty 5

## 2022-01-23 MED ORDER — OXYCODONE HCL 5 MG PO TABS
5.0000 mg | ORAL_TABLET | Freq: Once | ORAL | Status: AC | PRN
  Administered 2022-01-23: 5 mg via ORAL
  Filled 2022-01-23: qty 1

## 2022-01-23 MED ORDER — METOPROLOL TARTRATE 5 MG/5ML IV SOLN: 5.0000 mg | Freq: Three times a day (TID) | INTRAVENOUS | Status: DC | PRN

## 2022-01-23 NOTE — Progress Notes (Signed)
Rounding Note    Patient Name: Lauren [name] Date of Encounter: 01/23/2022  [place] Cardiologist: Carlyle Dolly, MD   Subjective   Not very verbal this am   Inpatient Medications    Scheduled Meds:  sodium chloride   Intravenous Once   arformoterol  15 mcg Nebulization BID   atorvastatin  40 mg Per Tube Daily   budesonide (PULMICORT) nebulizer solution  0.5 mg Nebulization BID   Chlorhexidine Gluconate Cloth  6 each Topical Daily   docusate  100 mg Per Tube BID   enoxaparin (LOVENOX) injection  80 mg Subcutaneous Q12H   free water  100 mL Per Tube Q4H   hydrALAZINE  12.5 mg Per Tube Q8H   insulin aspart  0-20 Units Subcutaneous Q4H   insulin aspart  6 Units Subcutaneous Q4H   insulin detemir  30 Units Subcutaneous BID   isosorbide mononitrate  20 mg Per Tube BID   levothyroxine  75 mcg Per Tube Q0600   melatonin  3 mg Per Tube QHS   metoprolol succinate  25 mg Oral Once   metoprolol succinate  75 mg Oral Daily   nicotine  21 mg Transdermal Daily   pantoprazole  40 mg Per Tube Daily   revefenacin  175 mcg Nebulization Daily   ticagrelor  90 mg Oral BID   Or   ticagrelor  90 mg Per Tube BID   Continuous Infusions:  feeding supplement (GLUCERNA 1.5 CAL) 55 mL/hr at 01/22/22 2310   PRN Meds: acetaminophen **OR** acetaminophen (TYLENOL) oral liquid 160 mg/5 mL **OR** acetaminophen, docusate, influenza vac split quadrivalent PF, ipratropium-albuterol, labetalol, mouth rinse, pneumococcal 20-valent conjugate vaccine, polyethylene glycol, senna-docusate   Vital Signs    Vitals:   01/23/22 0500 01/23/22 0717 01/23/22 0820 01/23/22 0823  BP:  116/77    Pulse:  (!) 106    Resp:  20    Temp:  97.6 F (36.4 C)    TempSrc:  Axillary    SpO2:  100% 97% 97%  Weight: 83.9 kg     Height:        Intake/Output Summary (Last 24 hours) at 01/23/2022 0845 Last data filed at 01/23/2022 0403 Gross per 24 hour  Intake 4019.34 ml  Output 1350 ml  Net 2669.34  ml      01/23/2022    5:00 AM 01/22/2022    5:00 AM 01/21/2022    4:24 AM  Last 3 Weights  Weight (lbs) 184 lb 15.5 oz 184 lb 15.5 oz 184 lb 1.4 oz  Weight (kg) 83.9 kg 83.9 kg 83.5 kg      Telemetry    Normal sinus rhythm with rates in the 90s to low 100s. - Personally Reviewed  ECG    No new ECG tracing today. - Personally Reviewed  Physical Exam   GEN: No acute distress.   Neck: No JVD. Cardiac: Tachycardic with normal rhythm. No murmurs, rubs, or gallops.  Respiratory: Clear to auscultation bilaterally. No significant wheezes, rhonchi, or rales appreciated. GI: Soft, non-distended, and non-tender. MS: No lower extremity edema. Left foot wrapped in ACE bandage with sanguinous drainage. Skin: Warm and dry. Neuro:  Dysarthria improving. Left side hemiplegia.  Psych: Normal affect. Responds appropriately.  Labs    High Sensitivity Troponin:   Recent Labs  Lab 01/08/22 1920 01/11/22 1001 01/12/22 0027 01/12/22 0203 01/13/22 1059  TROPONINIHS 860* 3,869* 3,807* 3,512* 2,880*     Chemistry Recent Labs  Lab 01/18/22 0346 01/19/22 0405 01/21/22 0830  01/22/22 0443 01/23/22 0640  NA 136   < > 136 136 138  K 4.0   < > 4.3 4.5 4.6  CL 96*   < > 94* 94* 98  CO2 27   < > 30 28 26   GLUCOSE 188*   < > 223* 196* 135*  BUN 78*   < > 87* 89* 93*  CREATININE 2.14*   < > 2.42* 2.62* 2.71*  CALCIUM 8.0*   < > 8.6* 8.8* 8.9  MG 2.7*  --   --   --   --   GFRNONAA 29*   < > 25* 23* 22*  ANIONGAP 13   < > 12 14 14    < > = values in this interval not displayed.    Lipids  No results for input(s): "CHOL", "TRIG", "HDL", "LABVLDL", "LDLCALC", "CHOLHDL" in the last 168 hours.   Hematology Recent Labs  Lab 01/22/22 0443 01/23/22 0640 01/23/22 0704  WBC 23.5* 21.1* 21.2*  RBC 2.52* 2.35* 2.41*  HGB 7.3* 6.9* 7.0*  HCT 22.5* 21.5* 21.7*  MCV 89.3 91.5 90.0  MCH 29.0 29.4 29.0  MCHC 32.4 32.1 32.3  RDW 14.5 14.6 14.5  PLT 734* 788* 801*   Thyroid No results for  input(s): "TSH", "FREET4" in the last 168 hours.  BNPNo results for input(s): "BNP", "PROBNP" in the last 168 hours.  DDimer No results for input(s): "DDIMER" in the last 168 hours.   Radiology    DG Swallowing Func-Speech Pathology  Result Date: 01/21/2022 Table formatting from the original result was not included. Objective Swallowing Evaluation: Type of Study: MBS-Modified Barium Swallow Study  Patient Details Name: Lauren [name] MRN: [id number] Date of Birth: May 16, 1980 Today's Date: 01/21/2022 Time: SLP Start Time (ACUTE ONLY): 1400 -SLP Stop Time (ACUTE ONLY): 1424 SLP Time Calculation (min) (ACUTE ONLY): 24 min Past Medical History: Past Medical History: Diagnosis Date  Anemia   CAD (coronary artery disease)   a. s/p cath in 03/2014 showing 30% mid-LAD, moderate to severe disease along small D1, patent LCx, moderate to severe distal OM2 stenosis and moderate diffuse diease along RCA not amenable to PCI  CHF (congestive heart failure) ([place])   a. EF 55-60% in 12/2019 b. EF at 35-40% by echo in 05/2020  CKD (chronic kidney disease) stage 4, GFR 15-29 ml/min (HCC)   Diabetes mellitus without complication ([place])   Myocardial infarction ([place])   Neuropathy   Stroke [facility])  Past Surgical History: Past Surgical History: Procedure Laterality Date  AMPUTATION Left 09/02/2021  Procedure: AMPUTATION RAY;  Surgeon: Criselda Peaches, DPM;  Location: [employer];  Service: Podiatry;  Laterality: Left;  sagittal saw, 3L bag saline & Pulse  Cardiac catherization    CHOLECYSTECTOMY    I & D EXTREMITY Left 08/30/2021  Procedure: IRRIGATION AND DEBRIDEMENT WITH BONE BIOPSY;  Surgeon: [name], DPM;  Location: [facility];  Service: Podiatry;  Laterality: Left;  IR ANGIO VERTEBRAL SEL SUBCLAVIAN INNOMINATE UNI L MOD SED  01/18/2022  IR CT HEAD LTD  01/08/2022  IR INTRA CRAN STENT  01/08/2022  IR PERCUTANEOUS ART THROMBECTOMY/INFUSION INTRACRANIAL INC DIAG ANGIO  01/08/2022  IRRIGATION AND DEBRIDEMENT FOOT Left 09/04/2021  Procedure: IRRIGATION  AND DEBRIDEMENT FOOT AND CLOSURE;  Surgeon: Criselda Peaches, DPM;  Location: [facility];  Service: Podiatry;  Laterality: Left;  RADIOLOGY WITH ANESTHESIA N/A 01/08/2022  Procedure: IR WITH ANESTHESIA;  Surgeon: Radiologist, Medication, MD;  Location: [facility];  Service: Radiology;  Laterality: N/A;  THROMBECTOMY BRACHIAL ARTERY  Right 11/18/2021  Procedure: RIGHT BRACHIAL, RADIAL, & ULNAR ARTERY THROMBECTOMY.;  Surgeon: Marty Heck, MD;  Location: [facility];  Service: Vascular;  Laterality: Right;  TUBAL LIGATION   HPI: Montzerrat Brunell is a 41 y.o. female who presented to the ED for acute onset of chest pain with left side weakness. CT head right MCA and vertebral stroke; underwent IR thrombectomy of R ICA with R ICA dissection repair and stent placement with TICI 3. Intubated 9/8-10.  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 She had to be reintubated on 01/15/22 and extubated 01/18/22.  Subjective: alert for brief moments  Recommendations for follow up therapy are one component of a multi-disciplinary discharge planning process, led by the attending physician.  Recommendations may be updated based on patient status, additional functional criteria and insurance authorization. Assessment / Plan / Recommendation   01/21/2022   3:21 PM Clinical Impressions Clinical Impression Pt presents with oropharyngeal dysphagia characterized by reduced labial seal, weak lingual manipulation, impaired posterior propulsion, impaired mastication, reduced bolus cohesion, reduced tongue base retraction, and a pharyngeal delay. She demonstrated left-sided anterior spillage of liquids, prolonged mastication, oral residue, delayed oral transit with intermittent  lingual pumping, premature spillage, , mild vallecular residue, and swallow initiation with liquids at the level of the valleculae and pyriform sinuses. Penetration (PAS 3, 5) was noted with thin and occasionally with nectar thick liquids. Penetration often progressed to aspiration (PAS 7) with thin liquids and once with consecutive swallows of nectar thick liquids. Pt demonstrated a strong cough in response to larger amounts of aspirated material which appeared effective in expelling the aspirant. Laryngeal invasion was secondary to impaired timing of laryngeal closure. A chin tuck posture was ineffective in eliminating laryngeal invasion of liquids, and pt exhibited difficulty performing this or prompted coughing to attempt expulsion of penetrated material. A dysphagia 2 diet with nectar thick liquids is recommended at this time with observance of swallowing precautions. SLP will continue to follow pt. SLP Visit Diagnosis Dysphagia, pharyngeal phase (R13.13) Impact on safety and function Mild aspiration risk     01/21/2022   3:21 PM Treatment Recommendations Treatment Recommendations Therapy as outlined in treatment plan below     01/21/2022   3:21 PM Prognosis Prognosis for Safe Diet Advancement Good Barriers to Reach Goals Cognitive deficits   01/21/2022   3:21 PM Diet Recommendations SLP Diet Recommendations Dysphagia 2 (Fine chop) solids;Nectar thick liquid Liquid Administration via Cup;Straw Medication Administration Crushed with puree Compensations Slow rate;Small sips/bites;Follow solids with liquid Postural Changes Seated upright at 90 degrees     01/21/2022   3:21 PM Other Recommendations Oral Care Recommendations Oral care BID Follow Up Recommendations Acute inpatient rehab (3hours/day) Assistance recommended at discharge Frequent or constant Supervision/Assistance Functional Status Assessment Patient has had a recent decline in their functional status and demonstrates the ability to make significant  improvements in function in a reasonable and predictable amount of time.   01/21/2022   3:21 PM Frequency and Duration  Speech Therapy Frequency (ACUTE ONLY) min 2x/week Treatment Duration 2 weeks     01/21/2022   3:21 PM Oral Phase Oral Phase Impaired Oral - Honey Cup Weak lingual manipulation;Lingual pumping;Left anterior bolus loss;Reduced posterior propulsion;Delayed oral transit;Lingual/palatal residue;Decreased bolus cohesion;Premature spillage Oral -  Nectar Cup Weak lingual manipulation;Lingual pumping;Left anterior bolus loss;Reduced posterior propulsion;Delayed oral transit;Lingual/palatal residue;Decreased bolus cohesion;Premature spillage Oral - Nectar Straw Weak lingual manipulation;Lingual pumping;Left anterior bolus loss;Reduced posterior propulsion;Delayed oral transit;Lingual/palatal residue;Decreased bolus cohesion;Premature spillage Oral - Thin Cup Weak lingual manipulation;Lingual pumping;Left anterior bolus loss;Reduced posterior propulsion;Delayed oral transit;Lingual/palatal residue;Decreased bolus cohesion;Premature spillage Oral - Thin Straw Weak lingual manipulation;Lingual pumping;Left anterior bolus loss;Reduced posterior propulsion;Delayed oral transit;Lingual/palatal residue;Decreased bolus cohesion;Premature spillage Oral - Puree Weak lingual manipulation;Lingual pumping;Left anterior bolus loss;Reduced posterior propulsion;Delayed oral transit;Lingual/palatal residue;Decreased bolus cohesion;Premature spillage Oral - Mech Soft Lingual pumping;Left anterior bolus loss;Reduced posterior propulsion;Delayed oral transit;Lingual/palatal residue;Decreased bolus cohesion;Premature spillage;Impaired mastication;Weak lingual manipulation Oral - Regular Weak lingual manipulation;Lingual pumping;Left anterior bolus loss;Reduced posterior propulsion;Delayed oral transit;Lingual/palatal residue;Decreased bolus cohesion;Premature spillage;Impaired mastication Oral - Pill Weak lingual  manipulation;Lingual pumping;Left anterior bolus loss;Reduced posterior propulsion;Delayed oral transit;Lingual/palatal residue;Decreased bolus cohesion;Premature spillage;Impaired mastication    01/21/2022   3:21 PM Pharyngeal Phase Pharyngeal Phase Impaired Pharyngeal- Honey Cup Delayed swallow initiation-vallecula;Reduced tongue base retraction;Pharyngeal residue - valleculae Pharyngeal- Nectar Cup Reduced tongue base retraction;Pharyngeal residue - valleculae;Delayed swallow initiation-pyriform sinuses;Penetration/Aspiration during swallow Pharyngeal Material enters airway, remains ABOVE vocal cords and not ejected out Pharyngeal- Nectar Straw Reduced tongue base retraction;Pharyngeal residue - valleculae;Delayed swallow initiation-pyriform sinuses;Penetration/Aspiration during swallow Pharyngeal Material enters airway, remains ABOVE vocal cords and not ejected out Pharyngeal- Thin Cup Reduced tongue base retraction;Pharyngeal residue - valleculae;Delayed swallow initiation-pyriform sinuses;Penetration/Aspiration during swallow Pharyngeal Material enters airway, remains ABOVE vocal cords and not ejected out;Material enters airway, CONTACTS cords and not ejected out Pharyngeal- Thin Straw Reduced tongue base retraction;Pharyngeal residue - valleculae;Delayed swallow initiation-pyriform sinuses;Penetration/Aspiration during swallow Pharyngeal Material enters airway, remains ABOVE vocal cords and not ejected out;Material enters airway, CONTACTS cords and not ejected out Pharyngeal- Puree Reduced tongue base retraction;Pharyngeal residue - valleculae;Delayed swallow initiation-vallecula Pharyngeal- Mechanical Soft Reduced tongue base retraction;Pharyngeal residue - valleculae;Delayed swallow initiation-vallecula Pharyngeal- Regular Reduced tongue base retraction;Pharyngeal residue - valleculae;Delayed swallow initiation-vallecula Pharyngeal- Pill Reduced tongue base retraction;Pharyngeal residue - valleculae;Delayed  swallow initiation-vallecula    01/21/2022   3:21 PM Cervical Esophageal Phase  Cervical Esophageal Phase [facility], [place], [place] Office number [phone] Horton Marshall 01/21/2022, 3:39 PM                      Cardiac Studies   Echocardiogram with Bubble Study 01/10/2022: Impressions:  1. There is akinesis of the mid-to-apical septal LV segments, all apical  segments, and apex. There is significant thinning of the mid-to-apical  septal segments and apex likely represents scarring/infarct. The  basal-to-mid inferior and basal septal  segments are hypokinetic.   2. No LV thrombus visualized on definity imaging.   3. Left ventricular ejection fraction, by estimation, is 30 to 35%. The  left ventricle has moderately decreased function. The left ventricle  demonstrates regional wall motion abnormalities (see scoring  diagram/findings for description). The left  ventricular internal cavity size was mildly-to-moderately dilated. There  is mild concentric left ventricular hypertrophy. Indeterminate diastolic  filling due to E-A fusion.   4. Right ventricular systolic function is normal. The right ventricular  size is normal.   5. The mitral valve is normal in structure. Trivial mitral valve  regurgitation.   6. The aortic valve is tricuspid. Aortic valve regurgitation is not  visualized. No aortic stenosis is present.   7. The inferior vena cava is dilated in size with >50% respiratory  variability, suggesting right atrial pressure of 8 mmHg.  8. Agitated saline contrast bubble study was negative, with no evidence  of any interatrial shunt.   Comparison(s): Compared to prior TTE on 10/2021, the EF has dropped to  30-35% with similar wall motion.  _______________   Complete Echocardiogram 01/14/2022: Impressions: 1. Left ventricular ejection fraction, by estimation, is 35 to 40%. Left  ventricular ejection fraction by 2D MOD biplane is 37.1  %. The left  ventricle has moderately decreased function. The left ventricle  demonstrates regional wall motion abnormalities  (see scoring diagram/findings for description). There is mild left  ventricular hypertrophy. Left ventricular diastolic parameters are  consistent with Grade II diastolic dysfunction (pseudonormalization).  Elevated left ventricular end-diastolic pressure.  The E/e' is 73. Wall motion abnormality suggestive of LAD territory  infarct or less likely Takatsubo cardiomyopathy.   2. Right ventricular systolic function is normal. The right ventricular  size is normal. Tricuspid regurgitation signal is inadequate for assessing  PA pressure.   3. The mitral valve is abnormal. Trivial mitral valve regurgitation.   4. The inferior vena cava is normal in size with <50% respiratory  variability, suggesting right atrial pressure of 8 mmHg.   5. The aortic valve is tricuspid. Aortic valve regurgitation is not  visualized.   Comparison(s): Changes from prior study are noted. 01/10/2022: LVEF 30-35%,  mid to apical ventricular wall thinning and severe hypokinesis to  akinesis. _______________   ABIs/TBIs 01/14/2022: Summary:  Right: Resting right ankle-brachial index indicates noncompressible right  lower extremity arteries. The right toe-brachial index is abnormal.   Left: Resting left ankle-brachial index indicates noncompressible left  lower extremity arteries. Although ankle-brachial indices are within mild  (0.80-0.94) range, patient history of non-compressible arteries on  previous exams coupled with abnormal,  monophasic waveforms suggests progression of arterial disease.   Right ABIs appear decreased compared to prior study on 08-23-2021. Left  ABIs appear decreased compared to prior study on 08-23-2021.  Patient Profile     41 y.o. 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 She went immediately to IR for thrombectomy.  Cardiology was consulted on 01/11/2022 for further evaluation of EKG changes and elevated troponin.  Assessment & Plan    NSTEMI vs Demand Ischemia CAD History of moderate to severe disease on cardiac catheterization in 2015 which was treated medically. Patient presented with an acute stroke and was found to have elevated troponin. High-sensitivity troponin peaked at 3,869. Echo shows LVEF of 30-30% with multiple wall motion abnormalities. - No chest pain. - Lovenox - Continue Brilinta 90mg  twice daily.  - Continue Toprol-XL as as below. - Continue Imdur 20mg  twice daily. - Continue Lipitor 40mg  daily. - Likely demand ischemia no plans for inpatient ischemic w/u given stroke    Acute on Chronic Combined CHF Initial Echo on 9/8 showed LVEF of 30-35% with akinesis of the mid to apical septal LV segments, all apical segments, and apex as well as significant thinning of the mid to apical septal segments and apex likely representing scarring/infarct. 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  - Euvolemic on exam  - Will hold off on PO Lasix given  worsening renal function. - Switched from Lopressor to Toprol-XL 50mg  daily today. Rates in the 90s to 100s today. Will given another dose of Toprol-XL 25mg  this morning and then switch to 75mg  daily tomorrow. - Continue Imdur 20mg  daily twice daily. - NO ACEi/ARB/ARNI or MRA given renal function. - Will not initiate SGLT2 inhibitor at this time given renal function and GFR <30. - Continue to monitor daily weights, strict I/Os, and renal function.  - Wall motion  abnormalities consistent with LAD distribution vs  stress-induced cardiomyopathy. Will continue to manage medically at this time. Can consider ischemic evaluation as an outpatient once patient has recovered from acute stroke.    LV Apical Aneurysm Despite repeated studies, no LV thrombus has been identified. Will require lifelong anticoagulation nonetheless given right upper extremity arterial occlusion and stroke. Patient will be discharged on Brilinta and Lovenox given recurrent stroke on Eliquis and Plavix.   Hypertension BP mostly well controlled.  - Toprol increased  - Continue Hydralazine 12.5mg  three times daily and Imdur 20mg  twice daily.   Hyperlipidemia Lipid panel this admission: Total Cholesterol 85, Triglycerides 154, HDL 15, LDL 39. - Continue Lipitor 40mg  daily.   Type 1 Diabetes Mellitus - Management per primary team.   Acute on CKD stage IV Baseline creatinine around 2.0 to 2.5.  Creatinine 2.15 on admission but peaked at 4.54 on 9/10. Dropped as low as 2.14 following this but has been trending back up the last few days: 2.14 >> 2.17 >> 2.42 >> 2.62. BUN up to 89 today. - See recommendations for diuresis as above. - Continue to monitor closely.   Right Brachial Artery Occlusion S/p thrombectomy in 10/2021. - On Eliquis prior to admission.Now Lovenox    Acute Stroke Admitted with acute stroke with right ICA occlusion s/p thrombectomy. Previous hypercoagulable work-up showed normal results on anticardiolipin and beta2 glycoprotein 1 AB and AT3/prot C/prot S/Prothrombin Gene or Fct V Leyden mutations; equivocal lupus anticoagulant but ANA negative; abnormal homocysteine  on labs from July and August (21.5-21.6) but only in the moderate range. - No evidence of atrial fibrillation on telemetry. - He was on Eliquis and Plavix at home. Patient will be discharged on Brilinta and Lovenox. - Continue statin. - Hematology consulted and hypercoagulable work-up negative so far.  -  Management per Neurology.   Osteomyelitis of Left Foot - Podiatry and ID following.   Acute on Chronic Normocytic Anemia S/p a total of 3 units of PRBCs this admission, most recently 9/10. Felt to be multifactorial. Hemoglobin down from 8.1 on 9/20 to 7.3 today. - Management per primary team.   Outpatient follow-up has been arranged.   Otherwise, per primary team.    For questions or updates, please contact [place] Please consult [email or web address] for contact info under        Signed, Jenkins Rouge, MD  01/23/2022, 8:45 AM

## 2022-01-23 NOTE — Progress Notes (Signed)
Received a call from patient's RN to assisted with drawing patient's type and cross. Contacted the blood bank, unit's phlebotomist, and main lab to ensure the right tubes being drawn.  Drew and sent the specimen to blood bank. Applied a new blood bank bracelet.  Gomez Cleverly SWOT RN

## 2022-01-23 NOTE — Plan of Care (Signed)
Problem: Education: Goal: Ability to describe self-care measures that may prevent or decrease complications (Diabetes Survival Skills Education) will improve Outcome: Progressing Goal: Individualized Educational Video(s) Outcome: Progressing   Problem: Coping: Goal: Ability to adjust to condition or change in health will improve Outcome: Progressing   Problem: Fluid Volume: Goal: Ability to maintain a balanced intake and output will improve Outcome: Progressing   Problem: Health Behavior/Discharge Planning: Goal: Ability to identify and utilize available resources and services will improve Outcome: Progressing Goal: Ability to manage health-related needs will improve Outcome: Progressing   Problem: Metabolic: Goal: Ability to maintain appropriate glucose levels will improve Outcome: Progressing   Problem: Nutritional: Goal: Maintenance of adequate nutrition will improve Outcome: Progressing Goal: Progress toward achieving an optimal weight will improve Outcome: Progressing   Problem: Skin Integrity: Goal: Risk for impaired skin integrity will decrease Outcome: Progressing   Problem: Education: Goal: Understanding of CV disease, CV risk reduction, and recovery process will improve Outcome: Progressing Goal: Individualized Educational Video(s) Outcome: Progressing   Problem: Activity: Goal: Ability to return to baseline activity level will improve Outcome: Progressing   Problem: Cardiovascular: Goal: Vascular access site(s) Level 0-1 will be maintained Outcome: Progressing   Problem: Health Behavior/Discharge Planning: Goal: Ability to safely manage health-related needs after discharge will improve Outcome: Progressing   Problem: Activity: Goal: Ability to tolerate increased activity will improve Outcome: Progressing   Problem: Respiratory: Goal: Ability to maintain a clear airway and adequate ventilation will improve Outcome: Progressing   Problem: Role  Relationship: Goal: Method of communication will improve Outcome: Progressing   Problem: Education: Goal: Knowledge of General Education information will improve Description: Including pain rating scale, medication(s)/side effects and non-pharmacologic comfort measures Outcome: Progressing   Problem: Health Behavior/Discharge Planning: Goal: Ability to manage health-related needs will improve Outcome: Progressing   Problem: Clinical Measurements: Goal: Ability to maintain clinical measurements within normal limits will improve Outcome: Progressing Goal: Will remain free from infection Outcome: Progressing Goal: Diagnostic test results will improve Outcome: Progressing Goal: Respiratory complications will improve Outcome: Progressing   Problem: Activity: Goal: Risk for activity intolerance will decrease Outcome: Progressing   Problem: Nutrition: Goal: Adequate nutrition will be maintained Outcome: Progressing   Problem: Coping: Goal: Level of anxiety will decrease Outcome: Progressing   Problem: Elimination: Goal: Will not experience complications related to bowel motility Outcome: Progressing Goal: Will not experience complications related to urinary retention Outcome: Progressing   Problem: Pain Managment: Goal: General experience of comfort will improve Outcome: Progressing   Problem: Safety: Goal: Ability to remain free from injury will improve Outcome: Progressing   Problem: Skin Integrity: Goal: Risk for impaired skin integrity will decrease Outcome: Progressing   Problem: Education: Goal: Knowledge of disease or condition will improve Outcome: Progressing Goal: Knowledge of secondary prevention will improve (SELECT ALL) Outcome: Progressing Goal: Knowledge of patient specific risk factors will improve (INDIVIDUALIZE FOR PATIENT) Outcome: Progressing Goal: Individualized Educational Video(s) Outcome: Progressing   Problem: Coping: Goal: Will  verbalize positive feelings about self Outcome: Progressing Goal: Will identify appropriate support needs Outcome: Progressing   Problem: Health Behavior/Discharge Planning: Goal: Ability to manage health-related needs will improve Outcome: Progressing   Problem: Self-Care: Goal: Ability to participate in self-care as condition permits will improve Outcome: Progressing Goal: Verbalization of feelings and concerns over difficulty with self-care will improve Outcome: Progressing Goal: Ability to communicate needs accurately will improve Outcome: Progressing   Problem: Nutrition: Goal: Risk of aspiration will decrease Outcome: Progressing Goal: Dietary intake will improve Outcome:  Progressing

## 2022-01-23 NOTE — Progress Notes (Signed)
Received a call from Pharmacy concerning a heparin lab I drew at 12 pm that was never received by the main lab. After calling the unit I learned from the unit secretary that the tube containing the specimen failed to advance through the tube system to the main lab and was placed to the side by someone on the unit. Due to the time lapse the specimen couldn't be used.  Pharmacist made the patient's RN aware.

## 2022-01-23 NOTE — Progress Notes (Signed)
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  She was initially admitted under PCCM service, extubated on 01/18/2022 and transferred to [facility] on 01/21/2022.   Obj: 9/23 A/O x4 states does not recall anyone telling her that she has CHF or renal failure.  +5/10 substernal chest pain negative radiation, positive diaphoresis.  Stated just started during our discussion.   Objective: VITAL SIGNS: Temp: 97 F (36.1 C) (09/23 0346) Temp Source: Oral (09/23 0346) BP: 116/83 (09/23 0346) Pulse Rate: 103 (09/23 0346) SPO2; FIO2:   Intake/Output Summary (Last 24 hours) at 01/23/2022 0730 Last data filed at 01/23/2022 0403 Gross per 24 hour  Intake 4496.92 ml  Output 1750 ml  Net 2746.92 ml     Exam: General: A/O x4, No acute respiratory distress Lungs: tachypneic clear to auscultation bilaterally without wheezes or crackles Cardiovascular: Tachycardic without murmur gallop or rub normal S1 and S2 Abdomen: OBESE, negative tenderness, nondistended, soft, bowel sounds positive, no rebound, no ascites, no appreciable mass Extremities: No significant cyanosis, clubbing, or edema bilateral lower extremities Skin: Negative rashes, lesions, ulcers Psychiatric:  Negative depression, negative anxiety, negative fatigue, negative mania  Central nervous system:  Cranial nerves II through XII intact, tongue/uvula midline, LEFT hemiparesis, positive dysarthria, negative expressive aphasia, negative receptive aphasia.  .   Mobility Assessment (last 72 hours)     Mobility Assessment     Row Name 01/23/22 0336 01/23/22 0107 01/22/22 1948 01/22/22 1424  01/22/22 0415   What is the highest level of mobility based on the progressive mobility assessment? Level 1 (Bedfast) - Unable to balance while sitting on edge of bed Level 1 (Bedfast) - Unable to balance while sitting on edge of bed Level 1 (Bedfast) - Unable to balance while sitting on edge of bed Level 1 (Bedfast) - Unable to balance while sitting on edge of bed Level 1 (Bedfast) - Unable to balance while sitting on edge of bed   Is the above level different from baseline mobility prior to current illness? Yes - Recommend PT order Yes - Recommend PT order Yes - Recommend PT order -- Yes - Recommend PT order    Row Name 01/21/22 2357 01/21/22 2047 01/21/22 1205 01/20/22 1929 01/20/22 1347   What is the highest level of mobility based on the progressive mobility assessment? Level 1 (Bedfast) - Unable to balance while sitting on edge of bed Level 1 (Bedfast) - Unable to balance while sitting on edge of bed Level 2 (Chairfast) - Balance while sitting on edge of bed and cannot stand Level 2 (Chairfast) - Balance while sitting on edge of bed and cannot stand Level 2 (Chairfast) - Balance while sitting on edge of bed and cannot stand   Is the above level different from baseline mobility prior to current illness? Yes - Recommend PT order Yes - Recommend PT order -- Yes - Recommend PT order --    [place] Name 01/20/22 0800           What is the highest level of mobility based on the progressive mobility assessment? Level 2 (Chairfast) - Balance while sitting on edge of bed and cannot stand  Is the above level different from baseline mobility prior to current illness? Yes - Recommend PT order                  DVT prophylaxis: Lovenox Code Status: Full Family Communication:  Status is: Inpatient    Dispo: The patient is from: Home              Anticipated d/c is to: Home              Anticipated d/c date is: > 3 days              Patient currently is not medically stable to  d/c.    Procedures/Significant Events: 9/8 Admitted with left sided weakness, facial droop.  NIHSS 8, to IR  for thrombectomy, intubated.  MRSA PCR +, Rhinovirus + 9/10 PSV wean, moving RUE/RLE, extubated  9/11 ECHO>, CT head negative for acute changes, Trach asp negative,  9/13 started on HFNC 10L overnight 9/14 tmax 101.2, remains on HFNC, MRI, Heme/onc and ID consult 9/18 extubated, infectious disease change antimicrobial coverage to cefadroxil and Flagyl 9/19: Required Precedex overnight for restlessness.  Off by a.m. rounds   Significant studies: CT head Periventricular white matter hypodensities adjacent to the frontal horn of the right lateral ventricles and in the right corona radiata are new since prior exam. This may represent acute ischemia. CTA head & neck - Age indeterminate occlusion of the proximal right ICA in the neck with non opacification distally in the upper neck and intracranially. Right MCA and A1 ACA are patent, but diminutive. Severe left paraclinoid ICA stenosis. CT perfusion, no evidence of core infarct or penumbra; however, there is approximately 38 mL of T-max greater than 4 seconds in the right MCA territory which suggests possible oligemia. IR with TICI3 of intracranial R ICA, however complicated by ICA dissection status post telescope stents Post IR CT - Redemonstrated hypodensity in the right frontal WM MRI x 2 Extensive patchy areas of acute infarction involving the right MCA territory, most confluent areas along the watershed. MRA  Asymmetric diffused decreased size of right MCA branch vessels compared to the left likely reflects decreased perfusion   Consultants:  Cardiology Neurology CIR   Cultures 9/16 LEFT foot bone biopsy negative    Antimicrobials: Anti-infectives (From admission, onward)    Start     Ordered Stop   01/18/22 1330  cefadroxil (DURICEF) capsule 500 mg  Status:  Discontinued        01/18/22 1231 01/21/22 0759   01/18/22  1330  metroNIDAZOLE (FLAGYL) tablet 500 mg  Status:  Discontinued        01/18/22 1231 01/21/22 0759   01/16/22 1700  ciprofloxacin (CIPRO) IVPB 400 mg  Status:  Discontinued        01/16/22 1015 01/18/22 1231   01/16/22 1200  vancomycin (VANCOREADY) IVPB 500 mg/100 mL  Status:  Discontinued        01/16/22 1018 01/18/22 1231   01/16/22 0400  ciprofloxacin (CIPRO) IVPB 200 mg  Status:  Discontinued        01/15/22 1244 01/16/22 1015   01/15/22 1330  vancomycin (VANCOREADY) IVPB 1750 mg/350 mL        01/15/22 1240 01/15/22 1600   01/15/22 1330  ciprofloxacin (CIPRO) IVPB 200 mg        01/15/22 1244 01/15/22 1722   01/15/22 1249  vancomycin variable dose per unstable renal function (pharmacist dosing)  Status:  Discontinued  01/15/22 1249 01/16/22 1019   01/09/22 2245  amoxicillin-clavulanate (AUGMENTIN) 500-125 MG per tablet 500 mg  Status:  Discontinued        01/09/22 2155 01/15/22 0912   01/09/22 2200  amoxicillin-clavulanate (AUGMENTIN) 875-125 MG per tablet 1 tablet  Status:  Discontinued        01/09/22 1055 01/09/22 1630   01/09/22 2200  amoxicillin-clavulanate (AUGMENTIN) 500-125 MG per tablet 500 mg  Status:  Discontinued        01/09/22 1630 01/09/22 2155   01/09/22 1030  amoxicillin-clavulanate (AUGMENTIN) 875-125 MG per tablet 1 tablet  Status:  Discontinued        01/09/22 0935 01/09/22 1055   01/08/22 1425  ceFAZolin (ANCEF) 2-4 GM/100ML-% IVPB       Note to Pharmacy: Wandalee Ferdinand J: cabinet override   01/08/22 1425 01/08/22 2035        A/P  Acute ischemic stroke of the right MCA and punctuated left MCA/ACA infarct with right ICA occlusion, -S/p repair with TICI, 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  -Etiology unclear likely large vessel disease, cannot rule out cardioembolic source -Initially admitted to ICU under PCCM service, had to be intubated -Hypercoagulable work-up showed abnormal lupus anticoagulant but was also on heparin, slightly  increased rheumatoid factor -Plan to repeat labs in 3 months and follow-up hematology as outpatient; hematology has been notified -She was previously on Eliquis and now switched to Lovenox per oncology recommendation,    History of limb ischemia/hypercoagulability - recent h/o  right upper extremity ischemia -S/p thrombectomy -Patient was on Eliquis PTA -Switch to Lovenox per pharmacy consult -She will need hematology follow-up as outpatient   Acute respiratory failure with hypoxia/COPD exacerbation/rhinovirus -Resolved -She was initially intubated and admitted under PCCM service, now extubated -She was started on IV Lasix which has been discontinued -Chest x-ray showed right infrahilar opacity which has improved, procalcitonin high at 1.18 -She is currently not requiring oxygen, O2 sats 98% on room air -Continue Brovana, Pulmicort   Acute kidney injury on CKD stage IV (baselineCr 2.0-2.5) Lab Results  Component Value Date   CREATININE 2.71 (H) 01/23/2022   CREATININE 2.62 (H) 01/22/2022   CREATININE 2.42 (H) 01/21/2022   CREATININE 2.17 (H) 01/20/2022   CREATININE 2.14 (H) 01/19/2022  -Contrast-induced nephropathy? - If continues to trend up will need to involve nephrology    Acute on Chronic Diastolic and Systolic CHF -2D echo showed EF of 87%, grade 2 diastolic dysfunction;With wall motion abnormality to the - No chest pain. - Lovenox - Continue Brilinta 90mg  twice daily.  - Will hold off on PO Lasix given worsening renal function. -Toprol-XL 50mg  daily today. Rates in the 90s to 100s today. Will given another dose of Toprol-XL 25mg  this morning and then switch to 75mg  daily tomorrow. - Continue Imdur 20mg  daily twice daily. - NO ACEi/ARB/ARNI or MRA given renal function. - Will not initiate SGLT2 inhibitor at this time given renal function and GFR <30. -Hydralazine 12.5 mg TID - Continue Imdur 20mg  twice daily. - Continue Lipitor 40mg  daily. - Likely demand ischemia no  plans for inpatient ischemic w/u given stroke  -Strict in and out - Daily weight -Transfuse for hemoglobin<7 -9/23 transfuse 1 unit PRBC  NSTEMI vs Demand Ischemia CAD LAD distribution question obstruction versus stress-induced cardiomyopathy. -Previous echocardiogram 7/20, EF 40 to 45% History of moderate to severe disease on cardiac catheterization in 2015 which was treated medically. Patient presented with an acute stroke and was found to have  elevated troponin. High-sensitivity troponin peaked at 3,869. Echo shows LVEF of 30-30% with multiple wall motion abnormalities. -See CHF  Sinus Tachycardia/Chest pain - 9/23 NTG SL x2 without resolution of CP - 9/23 morphine IV 2 mg - 9/23 Metoprolol IV 5 mg x 1 -9/23 Metoprolol IV 5 mg for HR> 105 hold for MAP<65   Hypertension -See CHF  Diabetes mellitus type 1/DKA on admission -DKA has resolved -Continue sliding scale insulin, patient is n.p.o. on tube feeds through NG tube -Continue sliding scale insulin with NovoLog -Continue insulin detemir CBG (last 3)  Recent Labs    01/23/22 0826 01/23/22 1304 01/23/22 1558  GLUCAP 160* 167* 192*      Hyperlipidemia -Continue fenofibrate, Lipitor   Hypothyroidism -Current Synthroid   Left foot metatarsal osteomyelitis -S/p/ Amputation on 09/02/2021 status post bone biopsy on 01/16/2022 diabetic foot ulcer: -Podiatrist was consulted recommended no surgical intervention at this point in time. Recommended to continue cefadroxil and Flagyl, and stop antibiotics if culture is negative. Abx have been stopped. -will require ID follow-up as an outpatient. -ID was consulted ;  -9/16 culture from bone biopsy negative.   Leukocytosis Discussed with ID regarding persistent leukocytosis -Does not seem to be infectious She was on steroids till 9/16, may be reactive Will need to follow cbc   Class 2 obesity (BMI 30.78 kg/m.)    Tobacco abuse: Current smoker she has been counseled, nicotine  patch was placed.  Goals of care - 9/23 consult palliative care: Multisystem organ failure patient understands likelihood of poor outcome discuss CODE STATUS changed to DNR.  Discussed short-term/long-term goals of care to include palliative care, hospice.      Care during the described time interval was provided by me .  I have reviewed this patient's available data, including medical history, events of note, physical examination, and all test results as part of my evaluation.

## 2022-01-23 NOTE — Progress Notes (Addendum)
Assisted patient's RN with administering 10 am after her RN Ty removed the meds from the pxyis. Was informed she wasn't going to give the patient's metoprolol due a low blood pressure. Administered the rest of the patient's 10 meds.

## 2022-01-23 NOTE — Progress Notes (Signed)
Critical Lab value Hgb 6.9, MD notified.

## 2022-01-23 NOTE — Progress Notes (Signed)
This RN came to unit to assist with patient care.  This RN made aware of assistance needed with blood transfusion and symptom management.  Upon initial assessment patient visibly in distress complaining of chest pain and shortness of breath.  Orders placed by MD.  Meds given by this RN for symptom management and blood administered following protocol

## 2022-01-24 ENCOUNTER — Inpatient Hospital Stay (HOSPITAL_COMMUNITY): Payer: No Typology Code available for payment source

## 2022-01-24 DIAGNOSIS — Z66 Do not resuscitate: Secondary | ICD-10-CM | POA: Diagnosis not present

## 2022-01-24 DIAGNOSIS — Z7189 Other specified counseling: Secondary | ICD-10-CM | POA: Diagnosis not present

## 2022-01-24 DIAGNOSIS — I639 Cerebral infarction, unspecified: Secondary | ICD-10-CM | POA: Diagnosis not present

## 2022-01-24 DIAGNOSIS — Z515 Encounter for palliative care: Secondary | ICD-10-CM | POA: Diagnosis not present

## 2022-01-24 LAB — GLUCOSE, CAPILLARY
Glucose-Capillary: 103 mg/dL — ABNORMAL HIGH (ref 70–99)
Glucose-Capillary: 114 mg/dL — ABNORMAL HIGH (ref 70–99)
Glucose-Capillary: 129 mg/dL — ABNORMAL HIGH (ref 70–99)
Glucose-Capillary: 136 mg/dL — ABNORMAL HIGH (ref 70–99)
Glucose-Capillary: 146 mg/dL — ABNORMAL HIGH (ref 70–99)
Glucose-Capillary: 147 mg/dL — ABNORMAL HIGH (ref 70–99)
Glucose-Capillary: 69 mg/dL — ABNORMAL LOW (ref 70–99)
Glucose-Capillary: 72 mg/dL (ref 70–99)
Glucose-Capillary: 76 mg/dL (ref 70–99)

## 2022-01-24 LAB — COMPREHENSIVE METABOLIC PANEL
ALT: 31 U/L (ref 0–44)
AST: 37 U/L (ref 15–41)
Albumin: 2.3 g/dL — ABNORMAL LOW (ref 3.5–5.0)
Alkaline Phosphatase: 93 U/L (ref 38–126)
Anion gap: 12 (ref 5–15)
BUN: 91 mg/dL — ABNORMAL HIGH (ref 6–20)
CO2: 25 mmol/L (ref 22–32)
Calcium: 8.7 mg/dL — ABNORMAL LOW (ref 8.9–10.3)
Chloride: 102 mmol/L (ref 98–111)
Creatinine, Ser: 2.69 mg/dL — ABNORMAL HIGH (ref 0.44–1.00)
GFR, Estimated: 22 mL/min — ABNORMAL LOW (ref >60)
Glucose, Bld: 135 mg/dL — ABNORMAL HIGH (ref 70–99)
Potassium: 4.5 mmol/L (ref 3.5–5.1)
Sodium: 139 mmol/L (ref 135–145)
Total Bilirubin: 0.6 mg/dL (ref 0.3–1.2)
Total Protein: 6 g/dL — ABNORMAL LOW (ref 6.5–8.1)

## 2022-01-24 LAB — TROPONIN I (HIGH SENSITIVITY): Troponin I (High Sensitivity): 133 ng/L (ref <18)

## 2022-01-24 LAB — CBC WITH DIFFERENTIAL/PLATELET
Abs Immature Granulocytes: 0.63 10*3/uL — ABNORMAL HIGH (ref 0.00–0.07)
Basophils Absolute: 0.1 10*3/uL (ref 0.0–0.1)
Basophils Relative: 1 %
Eosinophils Absolute: 0.5 10*3/uL (ref 0.0–0.5)
Eosinophils Relative: 2 %
HCT: 22.7 % — ABNORMAL LOW (ref 36.0–46.0)
Hemoglobin: 7.4 g/dL — ABNORMAL LOW (ref 12.0–15.0)
Immature Granulocytes: 3 %
Lymphocytes Relative: 17 %
Lymphs Abs: 3.3 10*3/uL (ref 0.7–4.0)
MCH: 27.3 pg (ref 26.0–34.0)
MCHC: 32.6 g/dL (ref 30.0–36.0)
MCV: 83.8 fL (ref 80.0–100.0)
Monocytes Absolute: 1.3 10*3/uL — ABNORMAL HIGH (ref 0.1–1.0)
Monocytes Relative: 7 %
Neutro Abs: 13.2 10*3/uL — ABNORMAL HIGH (ref 1.7–7.7)
Neutrophils Relative %: 70 %
Platelets: 734 10*3/uL — ABNORMAL HIGH (ref 150–400)
RBC: 2.71 MIL/uL — ABNORMAL LOW (ref 3.87–5.11)
RDW: 24.2 % — ABNORMAL HIGH (ref 11.5–15.5)
Smear Review: NORMAL
WBC: 18.9 10*3/uL — ABNORMAL HIGH (ref 4.0–10.5)
nRBC: 0.2 % (ref 0.0–0.2)

## 2022-01-24 LAB — PHOSPHORUS: Phosphorus: 7.3 mg/dL — ABNORMAL HIGH (ref 2.5–4.6)

## 2022-01-24 LAB — MAGNESIUM: Magnesium: 3.2 mg/dL — ABNORMAL HIGH (ref 1.7–2.4)

## 2022-01-24 LAB — HEPARIN ANTI-XA: Heparin LMW: >2 IU/mL

## 2022-01-24 MED ORDER — ACETAMINOPHEN 500 MG PO TABS
500.0000 mg | ORAL_TABLET | Freq: Three times a day (TID) | ORAL | Status: DC
  Administered 2022-01-24 – 2022-01-25 (×3): 500 mg via ORAL
  Filled 2022-01-24 (×3): qty 1

## 2022-01-24 MED ORDER — ENOXAPARIN SODIUM 80 MG/0.8ML IJ SOSY
80.0000 mg | PREFILLED_SYRINGE | INTRAMUSCULAR | Status: DC
  Administered 2022-01-25: 80 mg via SUBCUTANEOUS
  Filled 2022-01-24: qty 0.8

## 2022-01-24 NOTE — Progress Notes (Signed)
PROGRESS NOTE    Lauren [name]  RUE:[id number] DOB: [birth year] DOA: 01/08/2022 PCP: Lemmie Evens, MD   Brief Narrative:  41 yr old female with a history of CAD, chronic diastolic heart failure, diabetes mellitus type 2 with peripheral neuropathy with recent arterial thrombectomy to the right upper extremity due to limb ischemia, CKD stage IIIa, peripheral arterial disease was admitted with right MCA CVA and V3 segment of the right vertebral artery stenosis with left ICA stenosis.  She was initially admitted under PCCM service, extubated on 01/18/2022 and transferred to [facility] on 01/21/2022.   Significant Events: 9/8 Admitted with left sided weakness, facial droop.  NIHSS 8, to IR  for thrombectomy, intubated.  MRSA PCR +, Rhinovirus + 9/10 PSV wean, moving RUE/RLE, extubated  9/11 ECHO>, CT head negative for acute changes, Trach asp negative,  9/13 started on HFNC 10L overnight 9/14 tmax 101.2, remains on HFNC, MRI, Heme/onc and ID consult 9/18 extubated, infectious disease change antimicrobial coverage to cefadroxil and Flagyl 9/19: Required Precedex overnight for restlessness.  Off by a.m. rounds   Significant studies: CT head Periventricular white matter hypodensities adjacent to the frontal horn of the right lateral ventricles and in the right corona radiata are new since prior exam. This may represent acute ischemia. CTA head & neck - Age indeterminate occlusion of the proximal right ICA in the neck with non opacification distally in the upper neck and intracranially. Right MCA and A1 ACA are patent, but diminutive. Severe left paraclinoid ICA stenosis. CT perfusion, no evidence of core infarct or penumbra; however, there is approximately 38 mL of T-max greater than 4 seconds in the right MCA territory which suggests possible oligemia. IR with TICI3 of intracranial R ICA, however complicated by ICA dissection status post telescope stents Post IR CT - Redemonstrated hypodensity in the  right frontal WM MRI x 2 Extensive patchy areas of acute infarction involving the right MCA territory, most confluent areas along the watershed. 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)   Hypothyroidism   CAD (coronary artery disease)   Acute renal failure superimposed on stage 4 chronic kidney disease (HCC)   Tobacco abuse   Class 2 obesity due to excess calories without serious comorbidity with body mass index (BMI) of 39.0 to 39.9 in adult   Acute osteomyelitis of left foot ([place])   Internal carotid artery occlusion, right   Acute respiratory failure with hypoxia (HCC)   HFrEF (heart failure with reduced ejection fraction) (HCC)  Acute ischemic stroke of the right MCA and punctuated left MCA/ACA infarct with right ICA occlusion, s/p repair with TICI, 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  -Etiology unclear likely large vessel disease, cannot rule out cardioembolic source -Initially admitted to ICU under PCCM service, had to be intubated -2D echo showed EF of 30% with LDL 39, A1c 8.0 -Hypercoagulable work-up showed abnormal lupus anticoagulant but was also on heparin, slightly increased rheumatoid factor -Plan to repeat labs in 3 months and follow-up hematology as outpatient; hematology has been notified -She was previously on Eliquis and now switched to Lovenox per oncology recommendation, she should be on Lovenox therapeutically.  Patient is also on [place] to go to inpatient rehab   History of limb ischemia/hypercoagulability - recent  h/o  right upper extremity ischemia -S/p thrombectomy -Patient was on Eliquis PTA -Switch to Lovenox per oncology recommendation.  -She will need hematology follow-up as  outpatient   Acute respiratory failure with hypoxia/COPD exacerbation/rhinovirus -Resolved -She was initially intubated and admitted under PCCM service, now extubated -She was started on IV Lasix which has been discontinued -Chest x-ray showed right infrahilar opacity which has improved, procalcitonin high at 1.18 -She is currently not requiring oxygen, O2 sats 98% on room air -Continue Brovana, Pulmicort   Acute kidney injury on CKD stage IV -Early contrast-induced nephropathy, creatinine peaked at 5 -Creatinine is down to 2.6 and has remained stable since 01/22/2022. -Lasix is currently on hold -Follow BMP in am   Chronic diastolic and systolic heart failure:  -2D echo showed EF of 10%, grade 2 diastolic dysfunction;With wall motion abnormality to the LAD distribution question obstruction versus stress-induced cardiomyopathy. -Euvolemic -Cardiology was consulted and relates this may be from demand ischemia from CHF, she will need ischemic evaluation as outpatient -She was started on Imdur, metoprolol, statin; continue on Brilinta -No ACE inhibitor/ARB due to renal dysfunction  Acute on chronic anemia: Hemoglobin dropped to 6.9 on 01/23/2022, received 1 unit of PRBC transfusion.  Hemoglobin over 7 now.  Monitor daily.  Per patient, she is intermittently bleeding from the left foot.  Did not have any bleeding on my examination today.   Hypertension -Patient is currently on metoprolol, Imdur -Blood pressure well controlled   Diabetes mellitus type 1/DKA on admission -DKA has resolved -Continue sliding scale insulin, patient is n.p.o. on tube feeds through NG tube -Continue sliding scale insulin with NovoLog -Continue insulin detemir -CBG well controlled   Hyperlipidemia -Continue fenofibrate, Lipitor   Nutrition/dysphagia -Continue tube feeding   Hypothyroidism -Current Synthroid   Left foot metatarsal osteomyelitis/status post amputation on 09/02/2021 status post bone biopsy  on 01/16/2022 diabetic foot ulcer: -Podiatrist was consulted recommended no surgical intervention at this point in time. Recommended to continue cefadroxil and Flagyl, andstop antibiotics if culture is negative. Abx have been stopped. -will require ID follow-up as an outpatient. ID was consulted ; culture from bone biopsy performed on 01/16/2022 negative till date.   Leukocytosis Discussed with ID regarding persistent leukocytosis -Does not seem to be infectious She was on steroids till 9/16, may be reactive.  Follow CBC.   Class 2 obesity due to excess calories without serious comorbidity with body mass index (BMI) of 39.0 to 39.9  in adult: Noted.   Tobacco abuse: Current smoker she has been counseled, nicotine patch was placed.  DVT prophylaxis: SCDs Start: 01/08/22 1926   Code Status: DNR  Family Communication: Husband and daughter present at bedside.  Plan of care discussed with patient in length and he/she verbalized understanding and agreed with it.  Status is: Inpatient Remains inpatient appropriate because: Pending placement to CIR.   Estimated body mass index is 30.78 kg/m as calculated from the following:   Height as of this encounter: _0  (1.651 m).   Weight as of this encounter: 83.9 kg.    Nutritional Assessment: Body mass index is 30.78 kg/m.Marland Kitchen Seen by dietician.  I agree with the assessment and plan as outlined below: Nutrition Status: Nutrition Problem: Inadequate oral intake Etiology: inability to eat Signs/Symptoms: NPO status Interventions: Tube feeding, Prostat  . Skin Assessment: I have examined the patient's skin and I agree with the wound assessment as performed by the wound care RN as outlined below:    Consultants:  Hematology Infectious disease  Podiatry Nephrology Cardiology  Procedures:  As above  Antimicrobials:  Anti-infectives (From admission, onward)    Start     Dose/Rate Route Frequency Ordered Stop   01/18/22 1330   cefadroxil (DURICEF) capsule 500 mg  Status:  Discontinued        500 mg Per Tube 2 times daily 01/18/22 1231 01/21/22 0759   01/18/22 1330  metroNIDAZOLE (FLAGYL) tablet 500 mg  Status:  Discontinued        500 mg Per Tube Every 12 hours 01/18/22 1231 01/21/22 0759   01/16/22 1700  ciprofloxacin (CIPRO) IVPB 400 mg  Status:  Discontinued        400 mg 200 mL/hr over 60 Minutes Intravenous Every 24 hours 01/16/22 1015 01/18/22 1231   01/16/22 1200  vancomycin (VANCOREADY) IVPB 500 mg/100 mL  Status:  Discontinued        500 mg 100 mL/hr over 60 Minutes Intravenous Every 24 hours 01/16/22 1018 01/18/22 1231   01/16/22 0400  ciprofloxacin (CIPRO) IVPB 200 mg  Status:  Discontinued        200 mg 100 mL/hr over 60 Minutes Intravenous Every 12 hours 01/15/22 1244 01/16/22 1015   01/15/22 1330  vancomycin (VANCOREADY) IVPB 1750 mg/350 mL        1,750 mg 175 mL/hr over 120 Minutes Intravenous  Once 01/15/22 1240 01/15/22 1600   01/15/22 1330  ciprofloxacin (CIPRO) IVPB 200 mg        200 mg 100 mL/hr over 60 Minutes Intravenous  Once 01/15/22 1244 01/15/22 1722   01/15/22 1249  vancomycin variable dose per unstable renal function (pharmacist dosing)  Status:  Discontinued         Does not apply See admin instructions 01/15/22 1249 01/16/22 1019   01/09/22 2245  amoxicillin-clavulanate (AUGMENTIN) 500-125 MG per tablet 500 mg  Status:  Discontinued        1 tablet Per Tube Every 12 hours 01/09/22 2155 01/15/22 0912   01/09/22 2200  amoxicillin-clavulanate (AUGMENTIN) 875-125 MG per tablet 1 tablet  Status:  Discontinued        1 tablet Per Tube Every 12 hours 01/09/22 1055 01/09/22 1630   01/09/22 2200  amoxicillin-clavulanate (AUGMENTIN) 500-125 MG per tablet 500 mg  Status:  Discontinued        1 tablet Oral Every 12 hours 01/09/22 1630 01/09/22 2155   01/09/22 1030  amoxicillin-clavulanate (AUGMENTIN) 875-125 MG per tablet 1 tablet  Status:  Discontinued        1 tablet Oral Every 12 hours  01/09/22 0935 01/09/22 1055   01/08/22 1425  ceFAZolin (ANCEF) 2-4 GM/100ML-% IVPB       Note to Pharmacy: Wandalee Ferdinand J: cabinet override      01/08/22 1425 01/08/22 2035         Subjective: Seen and examined.  She had no specific complaint.  Daughter and husband at the bedside.  They were upset that when they came this morning, patient was soaked in the urine.  Per patient, she kept calling nursing staff all night long but she was not attended.  When I was in the room, I pressed the call button and nurse came to the room right away.  Unsure if patient did in fact pressed the call button last night.  Objective: Vitals:   01/24/22 0410 01/24/22 0727 01/24/22 0954 01/24/22 0956  BP: 107/79 123/79 116/82   Pulse: 92 94 (!) 101   Resp: _0 Temp: 97.7 F (36.5  C) 98.6 F (37 C)    TempSrc: Axillary Axillary    SpO2: 100% 100% 100% 100%  Weight:      Height:        Intake/Output Summary (Last 24 hours) at 01/24/2022 1534 Last data filed at 01/23/2022 2355 Gross per 24 hour  Intake 1357.33 ml  Output 350 ml  Net 1007.33 ml   Filed Weights   01/21/22 0424 01/22/22 0500 01/23/22 0500  Weight: 83.5 kg 83.9 kg 83.9 kg    Examination:  General exam: Appears calm and comfortable  Respiratory system: Clear to auscultation. Respiratory effort normal. Cardiovascular system: S1 & S2 heard, RRR. No JVD, murmurs, rubs, gallops or clicks. No pedal edema. Gastrointestinal system: Abdomen is nondistended, soft and nontender. No organomegaly or masses felt. Normal bowel sounds heard. Central nervous system: Alert and oriented.  Left hemiparesis.  Left facial droop.  Dysarthria. Extremities: Symmetric 5 x 5 power. Skin: No rashes, lesions or ulcers Psychiatry: Judgement and insight appear normal. Mood & affect appropriate.    Data Reviewed: I have personally reviewed following labs and imaging studies  CBC: Recent Labs  Lab 01/20/22 0500 01/22/22 0443 01/23/22 0640  01/23/22 0704 01/24/22 0336  WBC 20.3* 23.5* 21.1* 21.2* 18.9*  NEUTROABS  --   --   --  15.8* 13.2*  HGB 8.1* 7.3* 6.9* 7.0* 7.4*  HCT 24.8* 22.5* 21.5* 21.7* 22.7*  MCV 89.9 89.3 91.5 90.0 83.8  PLT 583* 734* 788* 801* 496*   Basic Metabolic Panel: Recent Labs  Lab 01/18/22 0346 01/19/22 0405 01/21/22 0830 01/22/22 0443 01/23/22 0640 01/23/22 0704 01/23/22 1500 01/24/22 0336  NA 136   < > 136 136 138  --  140 139  K 4.0   < > 4.3 4.5 4.6  --  4.7 4.5  CL 96*   < > 94* 94* 98  --  100 102  CO2 27   < > _0 --  27 25  GLUCOSE 188*   < > 223* 196* 135*  --  165* 135*  BUN 78*   < > 87* 89* 93*  --  92* 91*  CREATININE 2.14*   < > 2.42* 2.62* 2.71*  --  2.71* 2.69*  CALCIUM 8.0*   < > 8.6* 8.8* 8.9  --  8.9 8.7*  MG 2.7*  --   --   --   --  3.1* 3.2* 3.2*  PHOS 4.8*  --   --   --   --  6.9* 6.9* 7.3*   < > = values in this interval not displayed.   GFR: Estimated Creatinine Clearance: 30.1 mL/min (A) (by C-G formula based on SCr of 2.69 mg/dL (H)). Liver Function Tests: Recent Labs  Lab 01/23/22 0704 01/24/22 0336  AST 33 37  ALT 28 31  ALKPHOS 94 93  BILITOT 0.3 0.6  PROT 6.3* 6.0*  ALBUMIN 2.3* 2.3*   No results for input(s): "LIPASE", "AMYLASE" in the last 168 hours. No results for input(s): "AMMONIA" in the last 168 hours. Coagulation Profile: No results for input(s): "INR", "PROTIME" in the last 168 hours. Cardiac Enzymes: No results for input(s): "CKTOTAL", "CKMB", "CKMBINDEX", "TROPONINI" in the last 168 hours. BNP (last 3 results) No results for input(s): "PROBNP" in the last 8760 hours. HbA1C: No results for input(s): "HGBA1C" in the last 72 hours. CBG: Recent Labs  Lab 01/23/22 2024 01/23/22 2348 01/24/22 0408 01/24/22 0731 01/24/22 1202  GLUCAP 137* 147* 129* 136* 146*  Lipid Profile: No results for input(s): "CHOL", "HDL", "LDLCALC", "TRIG", "CHOLHDL", "LDLDIRECT" in the last 72 hours. Thyroid Function Tests: No results for  input(s): "TSH", "T4TOTAL", "FREET4", "T3FREE", "THYROIDAB" in the last 72 hours. Anemia Panel: No results for input(s): "VITAMINB12", "FOLATE", "FERRITIN", "TIBC", "IRON", "RETICCTPCT" in the last 72 hours. Sepsis Labs: Recent Labs  Lab 01/21/22 0830  PROCALCITON 1.18    Recent Results (from the past 240 hour(s))  Culture, blood (Routine X 2) w Reflex to ID Panel     Status: None   Collection Time: 01/15/22  7:14 AM   Specimen: BLOOD LEFT HAND  Result Value Ref Range Status   Specimen Description BLOOD LEFT HAND  Final   Special Requests   Final    BOTTLES DRAWN AEROBIC AND ANAEROBIC Blood Culture adequate volume   Culture   Final    NO GROWTH 5 DAYS Performed at [facility], [place] [address] [place], [place] [address]    Report Status 01/20/2022 FINAL  Final  Culture, blood (Routine X 2) w Reflex to ID Panel     Status: None   Collection Time: 01/15/22  7:14 AM   Specimen: BLOOD RIGHT HAND  Result Value Ref Range Status   Specimen Description BLOOD RIGHT HAND  Final   Special Requests   Final    BOTTLES DRAWN AEROBIC AND ANAEROBIC Blood Culture adequate volume   Culture   Final    NO GROWTH 5 DAYS Performed at [facility], [place] [address], [place], [place] [address]    Report Status 01/20/2022 FINAL  Final  Culture, BAL-quantitative w Gram Stain     Status: Abnormal   Collection Time: 01/15/22  3:09 PM   Specimen: Bronchoalveolar Lavage; Respiratory  Result Value Ref Range Status   Specimen Description BRONCHIAL ALVEOLAR LAVAGE  Final   Special Requests NONE  Final   Gram Stain   Final    RARE WBC PRESENT,BOTH PMN AND MONONUCLEAR NO ORGANISMS SEEN    Culture (A)  Final    1,000 COLONIES/mL STAPHYLOCOCCUS EPIDERMIDIS CALL MICROBIOLOGY LAB IF SENSITIVITIES ARE REQUIRED. Performed at [facility], [place] [address] [place], [place] [address]    Report Status 01/18/2022 FINAL  Final  Culture, fungus without smear     Status: None (Preliminary result)    Collection Time: 01/15/22  3:09 PM   Specimen: Bronchoalveolar Lavage; Other  Result Value Ref Range Status   Specimen Description BRONCHIAL ALVEOLAR LAVAGE  Final   Special Requests NONE  Final   Culture   Final    NO FUNGUS ISOLATED AFTER 8 DAYS Performed at [facility], [address] [place], [place] [address]    Report Status PENDING  Incomplete  Aerobic/Anaerobic Culture w Gram Stain (surgical/deep wound)     Status: None   Collection Time: 01/16/22 11:48 AM   Specimen: Bone; Tissue  Result Value Ref Range Status   Specimen Description BONE  Final   Special Requests 4TH METATARSAL LEFT FOOT  Final   Gram Stain NO WBC SEEN NO ORGANISMS SEEN   Final   Culture   Final    No growth aerobically or anaerobically. Performed at [facility], [place] [address] [place], [place] [address]    Report Status 01/21/2022 FINAL  Final     Radiology Studies: No results found.  Scheduled Meds:  acetaminophen  500 mg Oral TID   arformoterol  15 mcg Nebulization BID   atorvastatin  40 mg  Per Tube Daily   budesonide (PULMICORT) nebulizer solution  0.5 mg Nebulization BID   Chlorhexidine Gluconate Cloth  6 each Topical Daily   docusate  100 mg Per Tube BID   [START ON 01/25/2022] enoxaparin (LOVENOX) injection  80 mg Subcutaneous Q24H   free water  100 mL Per Tube Q4H   hydrALAZINE  12.5 mg Per Tube Q8H   insulin aspart  0-20 Units Subcutaneous Q4H   insulin aspart  6 Units Subcutaneous Q4H   insulin detemir  30 Units Subcutaneous BID   isosorbide mononitrate  20 mg Per Tube BID   levothyroxine  75 mcg Per Tube Q0600   melatonin  3 mg Per Tube QHS   metoprolol succinate  25 mg Oral Once   metoprolol succinate  75 mg Oral Daily   nicotine  21 mg Transdermal Daily   pantoprazole  40 mg Per Tube Daily   revefenacin  175 mcg Nebulization Daily   ticagrelor  90 mg Oral BID   Or   ticagrelor  90 mg Per Tube BID   Continuous Infusions:  feeding supplement (GLUCERNA 1.5  CAL) 55 mL/hr at 01/23/22 1500     LOS: 16 days   Darliss Cheney, MD [facility]  01/24/2022, 3:34 PM   *Please note that this is a verbal dictation therefore any spelling or grammatical errors are due to the "[place] [employer]" system interpretation.  Please page via [place] and do not message via secure chat for urgent patient care matters. Secure chat can be used for non urgent patient care matters.  How to contact the [facility] Attending or Consulting provider [place] or covering provider during after hours [place], for this patient?  Check the care team in [facility] and look for a) attending/consulting [facility] provider listed and b) the [facility] team listed. Page or secure chat [phone]. Log into [email or web address] and use [place]'s universal password to access. If you do not have the password, please contact the hospital operator. Locate the [facility] provider you are looking for under [facility] and page to a number that you can be directly reached. If you still have difficulty reaching the provider, please page the [facility] (Director on Call) for the Hospitalists listed on amion for assistance.

## 2022-01-24 NOTE — Consult Note (Signed)
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  She was initially admitted under PCCM service, extubated on 01/18/2022 and transferred to [facility] on 01/21/2022.  Clinical Assessment/Goals of Care:  *Please note that this is a verbal dictation therefore any spelling or grammatical errors are due to the "[place]" system interpretation.  I have reviewed medical records including EPIC notes, labs and imaging, received report from bedside RN, assessed the patient.    I met with Lauren [name] (Spouse), and Lauren [name] (Daughter)  to further discuss diagnosis prognosis, GOC, EOL wishes, disposition and options.   I introduced Palliative Medicine as specialized medical care for people living with serious illness. It focuses on providing relief from the symptoms and stress of a serious illness. The goal is to improve quality of life for both the patient and the family.  Medical History Review and Understanding  Patient expressed that she was admitted into the hospital due to a stroke.  She describes the process as being hit by a truck.  She is aware of her past medical history including diabetes, heart failure, kidney disease.  She is aware that her stroke has left her with left-sided deficits.   Social History:  Lauren [name] has been with her husband for approximately 26 years and has 2 children 1 son and 1 daughter.  She currently works as a Automotive engineer at [employer].  She is in the process of completing her bachelor's in healthcare administration.   Functional  and Nutritional State:  Prior to admission she was able to take complete care of herself.  She is currently receiving tube feeds however she has recently passed a swallow evaluation and is on a thickened dysphagia 2 with nectar diet.  She does express that she is ready to get the cortrac out of her nose.   Palliative Symptoms:  Pain (in reference to her back)   Advance Directives:  A detailed discussion was had today regarding advanced directives.  Patient has not completed these in the past though is interested in doing so while hospitalized.   Code Status:  Concepts specific to code status, artifical feeding and hydration, continued IV antibiotics and rehospitalization was had.  The difference between a aggressive medical intervention path  and a palliative comfort care path for this patient at this time was had.   Encouraged patient/family to consider DNR/DNI status understanding evidenced based poor outcomes in similar hospitalized patient, as the cause of arrest is likely associated with advanced chronic/terminal illness rather than an easily reversible acute cardio-pulmonary event. I explained that DNR/DNI does not change the medical plan and it only comes into effect after a person has arrested (died).  It is a protective measure to keep [name] from harming the patient in their last moments of life. Lauren [name] was agreeable to DNR/DNI with understanding that patient would not receive CPR, defibrillation, ACLS medications, or intubation.   A MOST form was provided for review and completion.   Discussion:  Today I met with the patient and her family at bedside to discuss goals of care  given her recent stroke.  She is in hopes that she will recover well from the stroke.  She is tearful and expresses sorrow given the fact that she is so young and to be in such poor shape at this time.  Patient willingly brought up conversation of resuscitation.  Patient states that she would like to be a  DNR and expressed understanding of the meanings of DNR.  She understands that her recovery may be prolonged by meeting rehabilitation.  At this time she is acceptable for inpatient rehab.  She is uncertain if she would want to live in a facility for a prolonged amount of time.  Patient states that she has been able to get in the chair using a sky/Hoyer lift.  I briefly touched on the topic of her kidney disease with the potential of needing dialysis in the future.  Patient stated that she would need more time to make a decision on whether or not she would want dialysis.   We presented a MOST form and living will to the patient.  I went over each section of the MOST form and allowed opportunity for questioning.  Stressed the importance of a living will and recommended family to sit down with the patient and go over the patient's wishes.  I made them aware that this form does not need to be completed immediately howeverwill be involved in the patient's care if there is any questions that come about.   Patient and family expressed their concerns with the quality of care that the patient is receiving at this time.  Patient states that there is a prolonged wait between when she presses her call bell until she receives help. Daughter states that she found her mom this morning in urine.  I encouraged patient and family to express their concerns with the nursing staff.   Discussed the importance of continued conversation with family and their  medical providers regarding overall plan of care and treatment options, ensuring decisions are within the context of the patients values and GOCs.  Decision Maker:  Patient can presently make decisions for herself though if unable to would rely on her spouse, Lauren [name] (Spouse): [phone] [facility])  SUMMARY OF RECOMMENDATIONS   DNAR/DNI --> Would not desire a tracheostomy, long term gastrostomy tube, and she is undecided at this time regarding dialysis if a need  should arise  MOST and Advanced Directives provided for review and completion  Reviewed present clinical scenario and how patient may be effected in the long term  Goals are to go to CIR for rehabilitation --> Possible transfer tomorrow  Ongoing Palliative support  Code Status/Advance Care Planning: DNAR/DNI   Symptom Management:  Generalized Pain: - Continue tylenol - will make TID - Pls veer away from morphine given CKD and if needed provide oxycodone PO or low dose fentanyl IVP  Palliative Prophylaxis:  Aspiration, Bowel Regimen, Delirium Protocol, Frequent Pain Assessment, Oral Care, Palliative Wound Care, and Turn Reposition  Additional Recommendations (Limitations, Scope, Preferences): Continue current care  Psycho-social/Spiritual:  Desire for further Chaplaincy support: Yes Additional Recommendations: Education on stroke   Prognosis: Unable to determine  Discharge Planning: Most likely will need rehabilitation services   Review of Systems  Musculoskeletal:  Positive for back pain.  All other systems reviewed and are negative.  Mobility: Hoyer lift  Vitals:   01/24/22 0954 01/24/22 0956  BP: 116/82   Pulse: (!) 101   Resp: 15   Temp:    SpO2: 100% 100%  Intake/Output Summary (Last 24 hours) at 01/24/2022 1414 Last data filed at 01/23/2022 2355 Gross per 24 hour  Intake 1675.25 ml  Output 1150 ml  Net 525.25 ml   Last Weight  Most recent update: 01/23/2022  6:06 AM    Weight  83.9 kg (184 lb 15.5 oz)            Gen:  Adult Caucasian F in NAD HEENT: coretrack in place, moist mucous membranes CV: Regular rate and rhythm  PULM: ON RA breathing is even and nonlabored ABD: soft/nontender  EXT: No edema Neuro: Alert and oriented x3. Strong BLU/BLE movement. Little movement to LUE/LLE  PPS: 40%   This conversation/these recommendations were discussed with patient primary care team, Dr. Doristine Bosworth  Billing based on MDM: High  Problems Addressed:  One acute or chronic illness or injury that poses a threat to life or bodily function  Amount and/or Complexity of Data: Category 3:Discussion of management or test interpretation with external physician/other qualified health care professional/appropriate source (not separately reported)  Risks: Parenteral controlled substances, Decision regarding hospitalization or escalation of hospital care, and Decision not to resuscitate or to de-escalate care because of poor prognosis ______________________________________________________ [place] Team Team Cell Phone: [phone] Please utilize secure chat with additional questions, if there is no response within 30 minutes please call the above phone number  Palliative Medicine Team providers are available by phone from 7am to 7pm daily and can be reached through the team cell phone.  Should this patient require assistance outside of these hours, please call the patient's attending physician.

## 2022-01-24 NOTE — Progress Notes (Signed)
ANTICOAGULATION CONSULT NOTE - Follow Up Consult  Pharmacy Consult for Enoxaparin Indication: arterial clot/ischemic arm s/p revas by vascular  Allergies  Allergen Reactions   Wellbutrin [Bupropion] Hives   Cefepime Rash    Tolerates penicilllin   Ciprofloxacin Hcl Hives and Rash    Hives/rash at injection site; 01/15/22 tolerated IV cipro   Tape Rash    Patient Measurements: Height: 5\' 5"  (165.1 cm) Weight: 83.9 kg (184 lb 15.5 oz) IBW/kg (Calculated) : 57  Vital Signs: Temp: 98.6 F (37 C) (09/24 0727) Temp Source: Axillary (09/24 0727) BP: 116/82 (09/24 0954) Pulse Rate: 101 (09/24 0954)  Labs: Recent Labs    01/23/22 0640 01/23/22 0704 01/23/22 1500 01/23/22 2250 01/24/22 0050 01/24/22 0336 01/24/22 1224  HGB 6.9* 7.0*  --   --   --  7.4*  --   HCT 21.5* 21.7*  --   --   --  22.7*  --   PLT 788* 801*  --   --   --  734*  --   HEPRLOWMOCWT  --   --   --   --   --   --  >2.00  CREATININE 2.71*  --  2.71*  --   --  2.69*  --   TROPONINIHS  --   --  127* 132* 133*  --   --     Estimated Creatinine Clearance: 30.1 mL/min (A) (by C-G formula based on SCr of 2.69 mg/dL (H)).   Medications:  Scheduled:   arformoterol  15 mcg Nebulization BID   atorvastatin  40 mg Per Tube Daily   budesonide (PULMICORT) nebulizer solution  0.5 mg Nebulization BID   Chlorhexidine Gluconate Cloth  6 each Topical Daily   docusate  100 mg Per Tube BID   enoxaparin (LOVENOX) injection  80 mg Subcutaneous Q12H   free water  100 mL Per Tube Q4H   hydrALAZINE  12.5 mg Per Tube Q8H   insulin aspart  0-20 Units Subcutaneous Q4H   insulin aspart  6 Units Subcutaneous Q4H   insulin detemir  30 Units Subcutaneous BID   isosorbide mononitrate  20 mg Per Tube BID   levothyroxine  75 mcg Per Tube Q0600   melatonin  3 mg Per Tube QHS   metoprolol succinate  25 mg Oral Once   metoprolol succinate  75 mg Oral Daily   nicotine  21 mg Transdermal Daily   pantoprazole  40 mg Per Tube Daily    revefenacin  175 mcg Nebulization Daily   ticagrelor  90 mg Oral BID   Or   ticagrelor  90 mg Per Tube BID   Infusions:   feeding supplement (GLUCERNA 1.5 CAL) 55 mL/hr at 01/23/22 1500    Assessment: 61 yoF admitted with R MCA vertebral stroke s/p thrombectomy and stent placement. Pt was on apixaban prior to admit. She has been maintained on IV heparin since admission. Hematology consulted for hypercoagulable work-up which has been negative. They recommend discharging on enoxaparin. Pharmacy consulted to transition off heparin and begin enoxaparin.  Anti-Xa level was obtained ~4 hours after dose given this morning, and resulted at > 2 units/mL - supratherapeutic, on enoxaparin 80 mg SQ q12h. CrCl currently ~ 30.1 mL/min. CBC stable. No signs/symptoms of bleeding noted per RN.  Goal of Therapy:  Anti-Xa level 0.6-1 units/ml 4hrs after LMWH dose given Monitor platelets by anticoagulation protocol: Yes   Plan:  Decrease to Enoxaparin 80 mg SQ q24h. Recommend close monitoring of Anti-Xa levels  given fluctuating renal function. Monitor CBC and for signs/symptoms of bleeding.    Vance Peper, PharmD PGY-2 Pharmacy Resident Phone [phone] 01/24/2022 1:40 PM   Please check AMION for all [facility] phone numbers After 10:00 PM, call [place] [phone]

## 2022-01-24 NOTE — Progress Notes (Signed)
1926 - BG 69 taken by nurse aid.   1946 - This RN becomes aware of result. Assess patient. Message sent to Mitzi Hansen MD (night shift hospitalist) and Amie Portland MD (neurology is listed as primary care team, Rory Percy is night on site neurologist).  Message is as follows:   "This patient has a BG of 69 at this time. Patient is currently on a continuous tube feed. I am going to hold the scheduled q4 6 units of aspart. Patient is alert and oriented to baseline, awake and talking. Only complaint is she feels like she "can't breath" This was while I and a RT was at bedside. RT was administering a breathing treatment, per RT this is a common statement by patient during breathing treatment. Patient has low tolerance for the mask during breathing treatment. She is oxygenating at 100% on  and RR was 18.  Let me know if anything else should be done"  Dr Rory Percy reads the message and leaves the conversation.  Dr Myna Hidalgo reads the message but no response or new orders  2027 - NT rechecks BG: 76  2105 - This RN rechecks BG: BG 72. Patient is now lythargic and not communicating with words. This RN administered 8 Fl OZ of orange juice through NG.   Message sent to Dr Myna Hidalgo:  "Rechecked 1 hour after initial BG, BG 76 without intervention. Rechecked at 2105 72. Administered 8 FL OZ of orange juice through NG. Pt is lethargic."  Opyd responds:   "Agree with holding the scheduled insulin for tonight"  2140 BG 103

## 2022-01-25 DIAGNOSIS — R778 Other specified abnormalities of plasma proteins: Secondary | ICD-10-CM

## 2022-01-25 DIAGNOSIS — R7989 Other specified abnormal findings of blood chemistry: Secondary | ICD-10-CM

## 2022-01-25 LAB — CBC WITH DIFFERENTIAL/PLATELET
Abs Immature Granulocytes: 0 10*3/uL (ref 0.00–0.07)
Basophils Absolute: 0.2 10*3/uL — ABNORMAL HIGH (ref 0.0–0.1)
Basophils Relative: 1 %
Eosinophils Absolute: 0.6 10*3/uL — ABNORMAL HIGH (ref 0.0–0.5)
Eosinophils Relative: 3 %
HCT: 22 % — ABNORMAL LOW (ref 36.0–46.0)
Hemoglobin: 7 g/dL — ABNORMAL LOW (ref 12.0–15.0)
Lymphocytes Relative: 3 %
Lymphs Abs: 0.6 10*3/uL — ABNORMAL LOW (ref 0.7–4.0)
MCH: 26.8 pg (ref 26.0–34.0)
MCHC: 31.8 g/dL (ref 30.0–36.0)
MCV: 84.3 fL (ref 80.0–100.0)
Monocytes Absolute: 0 10*3/uL — ABNORMAL LOW (ref 0.1–1.0)
Monocytes Relative: 0 %
Neutro Abs: 17.7 10*3/uL — ABNORMAL HIGH (ref 1.7–7.7)
Neutrophils Relative %: 93 %
Platelets: 736 10*3/uL — ABNORMAL HIGH (ref 150–400)
RBC: 2.61 MIL/uL — ABNORMAL LOW (ref 3.87–5.11)
RDW: 22.2 % — ABNORMAL HIGH (ref 11.5–15.5)
WBC: 19 10*3/uL — ABNORMAL HIGH (ref 4.0–10.5)
nRBC: 0 % (ref 0.0–0.2)
nRBC: 0 /100 WBC

## 2022-01-25 LAB — COMPREHENSIVE METABOLIC PANEL
ALT: 33 U/L (ref 0–44)
AST: 39 U/L (ref 15–41)
Albumin: 2.4 g/dL — ABNORMAL LOW (ref 3.5–5.0)
Alkaline Phosphatase: 110 U/L (ref 38–126)
Anion gap: 10 (ref 5–15)
BUN: 77 mg/dL — ABNORMAL HIGH (ref 6–20)
CO2: 25 mmol/L (ref 22–32)
Calcium: 8.4 mg/dL — ABNORMAL LOW (ref 8.9–10.3)
Chloride: 99 mmol/L (ref 98–111)
Creatinine, Ser: 2.34 mg/dL — ABNORMAL HIGH (ref 0.44–1.00)
GFR, Estimated: 26 mL/min — ABNORMAL LOW (ref >60)
Glucose, Bld: 290 mg/dL — ABNORMAL HIGH (ref 70–99)
Potassium: 4.3 mmol/L (ref 3.5–5.1)
Sodium: 134 mmol/L — ABNORMAL LOW (ref 135–145)
Total Bilirubin: 0.5 mg/dL (ref 0.3–1.2)
Total Protein: 6.1 g/dL — ABNORMAL LOW (ref 6.5–8.1)

## 2022-01-25 LAB — GLUCOSE, CAPILLARY
Glucose-Capillary: 109 mg/dL — ABNORMAL HIGH (ref 70–99)
Glucose-Capillary: 150 mg/dL — ABNORMAL HIGH (ref 70–99)
Glucose-Capillary: 201 mg/dL — ABNORMAL HIGH (ref 70–99)
Glucose-Capillary: 232 mg/dL — ABNORMAL HIGH (ref 70–99)
Glucose-Capillary: 272 mg/dL — ABNORMAL HIGH (ref 70–99)
Glucose-Capillary: 281 mg/dL — ABNORMAL HIGH (ref 70–99)

## 2022-01-25 LAB — PHOSPHORUS: Phosphorus: 6.5 mg/dL — ABNORMAL HIGH (ref 2.5–4.6)

## 2022-01-25 LAB — MAGNESIUM: Magnesium: 3 mg/dL — ABNORMAL HIGH (ref 1.7–2.4)

## 2022-01-25 MED ORDER — METOPROLOL TARTRATE 25 MG/10 ML ORAL SUSPENSION
37.5000 mg | Freq: Two times a day (BID) | ORAL | Status: DC
  Administered 2022-01-26 – 2022-01-27 (×4): 37.5 mg
  Filled 2022-01-25 (×4): qty 15

## 2022-01-25 MED ORDER — OXYCODONE HCL 5 MG PO TABS
5.0000 mg | ORAL_TABLET | Freq: Four times a day (QID) | ORAL | Status: DC | PRN
  Administered 2022-01-25 – 2022-01-27 (×5): 5 mg via ORAL
  Filled 2022-01-25 (×7): qty 1

## 2022-01-25 MED ORDER — INSULIN DETEMIR 100 UNIT/ML ~~LOC~~ SOLN
25.0000 [IU] | Freq: Two times a day (BID) | SUBCUTANEOUS | Status: DC
  Administered 2022-01-25 – 2022-01-28 (×7): 25 [IU] via SUBCUTANEOUS
  Filled 2022-01-25 (×8): qty 0.25

## 2022-01-25 MED ORDER — ACETAMINOPHEN 500 MG PO TABS
500.0000 mg | ORAL_TABLET | Freq: Three times a day (TID) | ORAL | Status: DC
  Administered 2022-01-26 – 2022-01-27 (×6): 500 mg
  Filled 2022-01-25 (×6): qty 1

## 2022-01-25 MED ORDER — METOPROLOL TARTRATE 25 MG/10 ML ORAL SUSPENSION: 25.0000 mg | Freq: Three times a day (TID) | ORAL | Status: DC

## 2022-01-25 NOTE — Progress Notes (Addendum)
Rounding Note    Patient Name: Lauren [name] Date of Encounter: 01/25/2022  [place] Cardiologist: Carlyle Dolly, MD   Subjective   She is sitting in chair, states feeling the same. She states she does not know what to do and does not want feeding tube and unable to swallow safely. She states she had no chest pain or SOB.   Inpatient Medications    Scheduled Meds:  acetaminophen  500 mg Oral TID   arformoterol  15 mcg Nebulization BID   atorvastatin  40 mg Per Tube Daily   budesonide (PULMICORT) nebulizer solution  0.5 mg Nebulization BID   Chlorhexidine Gluconate Cloth  6 each Topical Daily   docusate  100 mg Per Tube BID   enoxaparin (LOVENOX) injection  80 mg Subcutaneous Q24H   free water  100 mL Per Tube Q4H   hydrALAZINE  12.5 mg Per Tube Q8H   insulin aspart  0-20 Units Subcutaneous Q4H   insulin aspart  6 Units Subcutaneous Q4H   insulin detemir  25 Units Subcutaneous BID   isosorbide mononitrate  20 mg Per Tube BID   levothyroxine  75 mcg Per Tube Q0600   melatonin  3 mg Per Tube QHS   metoprolol succinate  25 mg Oral Once   metoprolol succinate  75 mg Oral Daily   nicotine  21 mg Transdermal Daily   pantoprazole  40 mg Per Tube Daily   revefenacin  175 mcg Nebulization Daily   ticagrelor  90 mg Oral BID   Or   ticagrelor  90 mg Per Tube BID   Continuous Infusions:  feeding supplement (GLUCERNA 1.5 CAL) 55 mL/hr at 01/23/22 1500   PRN Meds: acetaminophen **OR** acetaminophen (TYLENOL) oral liquid 160 mg/5 mL **OR** acetaminophen, docusate, influenza vac split quadrivalent PF, ipratropium-albuterol, metoprolol tartrate, nitroGLYCERIN, mouth rinse, pneumococcal 20-valent conjugate vaccine, polyethylene glycol, senna-docusate   Vital Signs    Vitals:   01/24/22 2041 01/24/22 2336 01/25/22 0500 01/25/22 0511  BP:    115/71  Pulse:  96    Resp: 18 18    Temp: 97.8 F (36.6 C) (!) 97.5 F (36.4 C)    TempSrc: Axillary Axillary    SpO2:   100%    Weight:   83.5 kg   Height:        Intake/Output Summary (Last 24 hours) at 01/25/2022 0917 Last data filed at 01/24/2022 1637 Gross per 24 hour  Intake --  Output 650 ml  Net -650 ml       01/25/2022    5:00 AM 01/23/2022    5:00 AM 01/22/2022    5:00 AM  Last 3 Weights  Weight (lbs) 184 lb 1.4 oz 184 lb 15.5 oz 184 lb 15.5 oz  Weight (kg) 83.5 kg 83.9 kg 83.9 kg      Telemetry    Normal sinus rhythm 80-90s. - Personally Reviewed  ECG    No new ECG tracing today. - Personally Reviewed  Physical Exam   GEN: No acute distress.   Neck: No JVD. Cardiac: RRR, S1,S2, No murmurs, rubs, or gallops.  Respiratory: Clear to auscultation bilaterally. No significant wheezes, rhonchi, or rales appreciated. GI: Soft, non-distended MS: No lower extremity edema. Left foot wrapped in ACE bandage with sanguinous drainage. Skin: Warm and dry. Neuro:  Mild dysarthria, left side hemiplegia.  Psych: Normal affect. Responds appropriately. Left IJ CVL with dressing in place   Labs    High Sensitivity Troponin:   Recent  Labs  Lab 01/12/22 0203 01/13/22 1059 01/23/22 1500 01/23/22 2250 01/24/22 0050  TROPONINIHS 3,512* 2,880* 127* 132* 133*      Chemistry Recent Labs  Lab 01/23/22 0704 01/23/22 1500 01/24/22 0336 01/25/22 0550  NA  --  140 139 134*  K  --  4.7 4.5 4.3  CL  --  100 102 99  CO2  --  27 25 25   GLUCOSE  --  165* 135* 290*  BUN  --  92* 91* 77*  CREATININE  --  2.71* 2.69* 2.34*  CALCIUM  --  8.9 8.7* 8.4*  MG 3.1* 3.2* 3.2* 3.0*  PROT 6.3*  --  6.0* 6.1*  ALBUMIN 2.3*  --  2.3* 2.4*  AST 33  --  37 39  ALT 28  --  31 33  ALKPHOS 94  --  93 110  BILITOT 0.3  --  0.6 0.5  GFRNONAA  --  22* 22* 26*  ANIONGAP  --  13 12 10      Lipids  No results for input(s): "CHOL", "TRIG", "HDL", "LABVLDL", "LDLCALC", "CHOLHDL" in the last 168 hours.   Hematology Recent Labs  Lab 01/23/22 0704 01/24/22 0336 01/25/22 0550  WBC 21.2* 18.9* 19.0*  RBC  2.41* 2.71* 2.61*  HGB 7.0* 7.4* 7.0*  HCT 21.7* 22.7* 22.0*  MCV 90.0 83.8 84.3  MCH 29.0 27.3 26.8  MCHC 32.3 32.6 31.8  RDW 14.5 24.2* 22.2*  PLT 801* 734* 736*    Thyroid No results for input(s): "TSH", "FREET4" in the last 168 hours.  BNPNo results for input(s): "BNP", "PROBNP" in the last 168 hours.  DDimer No results for input(s): "DDIMER" in the last 168 hours.   Radiology    DG CHEST PORT 1 VIEW  Result Date: 01/24/2022 CLINICAL DATA:  Code stroke.  Hypoxia. EXAM: PORTABLE CHEST 1 VIEW COMPARISON:  January 19, 2022 FINDINGS: Feeding catheter tip collimated off the image. High density material within the stomach. Left IJ approach central venous catheter tip at the expected location of the cavoatrial junction. Cardiomediastinal silhouette is normal. Mediastinal contours appear intact. There is no evidence of focal airspace consolidation, pleural effusion or pneumothorax. Osseous structures are without acute abnormality. Soft tissues are grossly normal. IMPRESSION: No active disease. Electronically Signed   By: Fidela Salisbury M.D.   On: 01/24/2022 19:48    Cardiac Studies   Echocardiogram with Bubble Study 01/10/2022: Impressions:  1. There is akinesis of the mid-to-apical septal LV segments, all apical  segments, and apex. There is significant thinning of the mid-to-apical  septal segments and apex likely represents scarring/infarct. The  basal-to-mid inferior and basal septal  segments are hypokinetic.   2. No LV thrombus visualized on definity imaging.   3. Left ventricular ejection fraction, by estimation, is 30 to 35%. The  left ventricle has moderately decreased function. The left ventricle  demonstrates regional wall motion abnormalities (see scoring  diagram/findings for description). The left  ventricular internal cavity size was mildly-to-moderately dilated. There  is mild concentric left ventricular hypertrophy. Indeterminate diastolic  filling due to E-A  fusion.   4. Right ventricular systolic function is normal. The right ventricular  size is normal.   5. The mitral valve is normal in structure. Trivial mitral valve  regurgitation.   6. The aortic valve is tricuspid. Aortic valve regurgitation is not  visualized. No aortic stenosis is present.   7. The inferior vena cava is dilated in size with >50% respiratory  variability, suggesting right atrial  pressure of 8 mmHg.   8. Agitated saline contrast bubble study was negative, with no evidence  of any interatrial shunt.   Comparison(s): Compared to prior TTE on 10/2021, the EF has dropped to  30-35% with similar wall motion.  _______________   Complete Echocardiogram 01/14/2022: Impressions: 1. Left ventricular ejection fraction, by estimation, is 35 to 40%. Left  ventricular ejection fraction by 2D MOD biplane is 37.1 %. The left  ventricle has moderately decreased function. The left ventricle  demonstrates regional wall motion abnormalities  (see scoring diagram/findings for description). There is mild left  ventricular hypertrophy. Left ventricular diastolic parameters are  consistent with Grade II diastolic dysfunction (pseudonormalization).  Elevated left ventricular end-diastolic pressure.  The E/e' is 46. Wall motion abnormality suggestive of LAD territory  infarct or less likely Takatsubo cardiomyopathy.   2. Right ventricular systolic function is normal. The right ventricular  size is normal. Tricuspid regurgitation signal is inadequate for assessing  PA pressure.   3. The mitral valve is abnormal. Trivial mitral valve regurgitation.   4. The inferior vena cava is normal in size with <50% respiratory  variability, suggesting right atrial pressure of 8 mmHg.   5. The aortic valve is tricuspid. Aortic valve regurgitation is not  visualized.   Comparison(s): Changes from prior study are noted. 01/10/2022: LVEF 30-35%,  mid to apical ventricular wall thinning and severe  hypokinesis to  akinesis. _______________   ABIs/TBIs 01/14/2022: Summary:  Right: Resting right ankle-brachial index indicates noncompressible right  lower extremity arteries. The right toe-brachial index is abnormal.   Left: Resting left ankle-brachial index indicates noncompressible left  lower extremity arteries. Although ankle-brachial indices are within mild  (0.80-0.94) range, patient history of non-compressible arteries on  previous exams coupled with abnormal,  monophasic waveforms suggests progression of arterial disease.   Right ABIs appear decreased compared to prior study on 08-23-2021. Left  ABIs appear decreased compared to prior study on 08-23-2021.  Patient Profile     41 y.o. 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 She went immediately to IR for thrombectomy.  Cardiology was consulted on 01/11/2022 for further evaluation of EKG changes and elevated troponin.  Assessment & Plan    NSTEMI  CAD History of moderate to severe disease on cardiac catheterization in 2015 which was treated medically. Patient presented with an acute stroke and was found to have elevated troponin. Hs troponin peaked at 3,869. Echo shows LVEF of 30-30% with multiple wall motion abnormalities. - No chest pain. - On therapeutic Lovenox for upper extremity arterial occlusion - Continue Brilinta 90mg  twice daily., Toprol-XL 75mg  daily, Imdur 20mg  twice daily and Lipitor 40mg  daily. - Likely type 2 demand ischemia no plans for inpatient ischemic w/u given stroke , medical therapy as above   - Wall motion abnormalities consistent with LAD distribution vs  stress-induced cardiomyopathy. Can consider ischemic evaluation as an outpatient once patient has recovered from acute stroke.    Acute on Chronic Combined CHF Initial Echo on 9/8 showed LVEF of 30-35% with akinesis of the mid to apical septal LV segments, all apical segments, and apex as well  as significant thinning of the mid to apical septal segments and apex likely representing scarring/infarct. 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  - Euvolemic on exam  - Will hold off on PO Lasix given recovering AKI - GDMT: Switched from Lopressor to Toprol-XL 75mg  daily; Imdur 20mg  daily twice daily; NO ACEi/ARB/ARNI /MRA or SGLT2Ii given advanced CKD  - Continue to monitor daily weights, strict I/Os, and renal function.    LV Apical Aneurysm Despite repeated studies, no LV thrombus has been identified. Will require lifelong anticoagulation nonetheless given right upper extremity arterial occlusion and stroke. Patient will be discharged on Brilinta and Lovenox given recurrent stroke on Eliquis and Plavix.   Hypertension BP mostly well controlled.  - on Toprol XL 75mg , Hydralazine 12.5mg  three times daily and Imdur 20mg  twice daily.   Hyperlipidemia Lipid panel this admission: Total Cholesterol 85, Triglycerides 154, HDL 15, LDL 39. - on Lipitor 40mg  daily.  Acute on CKD stage IV - Baseline creatinine around 2.0 to 2.5.  Creatinine 2.15 on admission but peaked at 4.54 on 9/10. Recovering now Cr 2.34 today, diuresis held/not needed at this time     Right Brachial Artery Occlusion S/p thrombectomy in 10/2021. - On Eliquis prior to admission.Now Therapeutic Lovenox    Acute Stroke Admitted with acute stroke with right ICA occlusion s/p thrombectomy. Previous hypercoagulable work-up showed normal results on anticardiolipin and beta2 glycoprotein 1 AB and AT3/prot C/prot S/Prothrombin Gene or Fct V Leyden mutations;  equivocal lupus anticoagulant but ANA negative; abnormal homocysteine  on labs from July and August (21.5-21.6) but only in the moderate range. - No evidence of atrial fibrillation on telemetry. - He was on Eliquis and Plavix at home. Patient will be discharged on Brilinta and Lovenox. - Continue statin. - Hematology consulted and hypercoagulable work-up negative so far.  - Management per Neurology.   Type 1 DM  Osteomyelitis of Left Foot Acute on Chronic Normocytic Anemia - Management per primary team.     Outpatient follow-up has been arranged on 02/19/22     For questions or updates, please contact [place] Please consult [email or web address] for contact info under        Signed, Margie Billet, NP  01/25/2022, 9:17 AM

## 2022-01-25 NOTE — Progress Notes (Signed)
Inpatient Rehabilitation Admissions Coordinator   I await medical readiness and bed availability to admit to CIR. Noted WBC 19 and Hgb down requiring transfusion over the weekend.Was up with PT and OT today for the first time without bleeding from her foot wound. I will follow up tomorrow.  Danne Baxter, RN, MSN Rehab Admissions Coordinator [phone] 01/25/2022 10:57 AM

## 2022-01-25 NOTE — Progress Notes (Signed)
Physical Therapy Treatment Patient Details Name: Lauren [name] MRN: [id number] DOB: [birth year] Today's Date: 01/25/2022   History of Present Illness 41 y/o female presenting 9/8 with gait instability, L-sided facial droop, weakness, and slurred speech. 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 Hospitalization complicated by URI, Rhinovirus positive, acute COPD exacerbation, elevated troponin, possibly due to cardiac injury or Takotsubo cardiomyopathy. L 4th ray bone biopsy 01/16/22, concern for osteomyelitis. Intubated 9/15, extubated 9/18. 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    PT Comments    Pt more vocal today however continues to have both cognitive and functional deficits. Pt presenting with L UE and LE flexor tone with inability to use functionally, L inattention, R gaze preference requiring max verbal cues to tend to the L, impaired balance, and requires assistx2 for OOB mobility. Pt did demo improved sitting EOB balance today and didn't have any active L foot bleeding today allowing pt to progress OOB mobility to the chair. Pt with strong L Lateral lean and posterior bias in standing requiring maxAX2 for std pvt to chair. Pt to benefit from AIR upon d/c as pt is young and was indep and working PTA. Acute PT to cont to follow.    Recommendations for follow up therapy are one component of a multi-disciplinary discharge planning process, led by the attending physician.  Recommendations may be updated based on patient status, additional functional criteria and insurance authorization.  Follow Up Recommendations  Acute inpatient rehab (3hours/day)     Assistance Recommended at Discharge Frequent or constant  Supervision/Assistance  Patient can return home with the following Assist for transportation;Assistance with cooking/housework;Two people to help with walking and/or transfers;A lot of help with bathing/dressing/bathroom;Help with stairs or ramp for entrance   Equipment Recommendations  Other (comment) (defer to post acute)    Recommendations for Other Services Rehab consult     Precautions / Restrictions Precautions Precautions: Fall Precaution Comments: L inattention, R gaze preference, L hemiplegia, cortrak; left foot bleeding s/p biopsy Restrictions Weight Bearing Restrictions: No Other Position/Activity Restrictions: keeping L LE NWB at this time due to pt constantly bleeding from wound on L foot however appeared to have no active bleeding today     Mobility  Bed Mobility Overal bed mobility: Needs Assistance Bed Mobility: Supine to Sit Rolling: Total assist   Supine to sit: Max assist, HOB elevated     General bed mobility comments: pt pulling self up with R UE, despite max verbal cues pt unable to figure out how to pvt hips to R to EOB, pt unable to use L UE andLE functionally during transfer, ultimately maxA via pt pulling up on PT and PT pulling bed pad to bring hips to EOB    Transfers Overall transfer level: Needs assistance Equipment used: 2 person hand held assist (face to face transfer with gait belt and bed pad) Transfers: Sit to/from Stand, Bed to chair/wheelchair/BSC Sit to Stand: +2 physical assistance, Mod assist     Squat pivot transfers: Max assist, +2 physical assistance     General transfer comment: complete 2 sit to stands, pt unable to put L foot down due to flexor tone, impaired sensation, L inattention, and anticipated pain with weight bearing due to wound on foot. Pt  with strong L lateral lean with posterior bias despite max verbal and tactile cues for PT and OT. ultimately pt required maxAX2 for squat pvt on R foot from EOB to drop arm recliner. pt  with poor comprehension of scooting butt back in the chair requiring modAx2 with max verbal cues    Ambulation/Gait               General Gait Details: unable at this time   Stairs             Wheelchair Mobility    Modified Rankin (Stroke Patients Only) Modified Rankin (Stroke Patients Only) Pre-Morbid Rankin Score: No symptoms Modified Rankin: Severe disability     Balance Overall balance assessment: Needs assistance Sitting-balance support: Feet supported, Single extremity supported Sitting balance-Leahy Scale: Poor Sitting balance - Comments: min guar assist to maintain balance EOB with tendency to lean posterior and left Postural control: Left lateral lean, Posterior lean Standing balance support: Bilateral upper extremity supported Standing balance-Leahy Scale: Poor Standing balance comment: max A +2 to stand                            Cognition Arousal/Alertness: Awake/alert Behavior During Therapy: Flat affect, Impulsive Overall Cognitive Status: Impaired/Different from baseline Area of Impairment: Attention, Following commands, Awareness, Orientation, Safety/judgement                 Orientation Level: Disoriented to, Time, Situation, Place (stated she was in a nursing home, unable to state month or day of week, pt did state 2023 for the date) Current Attention Level: Focused   Following Commands: Follows one step commands inconsistently Safety/Judgement: Decreased awareness of safety, Decreased awareness of deficits (L in attention) Awareness: Intellectual Problem Solving: Slow processing, Decreased initiation, Difficulty sequencing, Requires verbal cues, Requires tactile cues General Comments: Oriented to self.        Exercises Other Exercises Other Exercises: PROM to L LE into knee extension and ankle DF    General Comments General comments (skin integrity, edema, etc.): VSS, no longer on O2      Pertinent Vitals/Pain  Pain Assessment Pain Assessment: Faces Faces Pain Scale: Hurts whole lot Pain Location: L LE with ROM Pain Descriptors / Indicators: Discomfort, Grimacing    Home Living                          Prior Function            PT Goals (current goals can now be found in the care plan section) Acute Rehab PT Goals Patient Stated Goal: to return home PT Goal Formulation: With family Time For Goal Achievement: 02/08/22 Potential to Achieve Goals: Good Progress towards PT goals: Progressing toward goals    Frequency    Min 4X/week      PT Plan Current plan remains appropriate    Co-evaluation PT/OT/SLP Co-Evaluation/Treatment: Yes Reason for Co-Treatment: Complexity of the patient's impairments (multi-system involvement) PT goals addressed during session: Mobility/safety with mobility        AM-PAC PT "6 Clicks" Mobility   Outcome Measure  Help needed turning from your back to your side while in a flat bed without using bedrails?: A Lot Help needed moving from lying on your back to sitting on the side of a flat bed without using bedrails?: A Lot Help needed moving to and from a bed to a chair (including a wheelchair)?: A Lot Help  needed standing up from a chair using your arms (e.g., wheelchair or bedside chair)?: A Lot Help needed to walk in hospital room?: Total Help needed climbing 3-5 steps with a railing? : Total 6 Click Score: 10    End of Session Equipment Utilized During Treatment: Gait belt Activity Tolerance: Patient tolerated treatment well Patient left: with call bell/phone within reach;in chair;with chair alarm set Nurse Communication: Mobility status PT Visit Diagnosis: Muscle weakness (generalized) (M62.81);Hemiplegia and hemiparesis;Other abnormalities of gait and mobility (R26.89) Hemiplegia - Right/Left: Left Hemiplegia - dominant/non-dominant: Non-dominant Hemiplegia - caused by: Cerebral infarction     Time: 8718-3672 PT Time  Calculation (min) (ACUTE ONLY): 27 min  Charges:  $Therapeutic Activity: 8-22 mins                     Kittie Plater, PT, DPT Acute Rehabilitation Services Secure chat preferred Office #: [phone]    [place] Lopes 01/25/2022, 9:04 AM

## 2022-01-25 NOTE — Progress Notes (Signed)
Daily Progress Note   Patient Name: Lauren [name]       Date: 01/25/2022 DOB: [birth year]  Age: 41 y.o. MRN#: [id number] Attending Physician: Lauren Cheney, MD Primary Care Physician: Lauren Evens, MD Admit Date: 01/08/2022  Reason for Consultation/Follow-up: Establishing goals of care  Subjective: Medical records reviewed including progress notes, labs, imaging. Patient assessed at the bedside. Discussed with RN.  Her family is present visiting.  Created space and opportunity for patient and family's thoughts and feelings on her current illness.  Lauren [name] is understandably emotional about her new dependence on others and still adjusting to her debility.  Emotional support and therapeutic listening was provided.  Patient's daughter has questions about reasoning for introduction of hospice yesterday and whether this is expected to be needed soon.  We discussed the importance of planning for all possible outcomes and understanding patient's boundaries on the types of care she would not be interested in (PEG tube, dialysis, long-term facility, etc).  Counseled family that if treatments ever become too burdensome or if patient's quality of life is unacceptable despite ongoing treatments, hospice would be an option to then focus on relief from suffering as the natural disease process is allowed to continue.  Encouraged patient and family to continue discussions and completion of MOST form and advanced directives to indicate these preferences.  They remain hopeful for recovery of functioning at this time.  Family also wishes for more assistance in advocating for patient's quality of care, as there have been some concerns about bedside manner and delays in [place] receiving help when calling  out.  Questions and concerns addressed. PMT will continue to support holistically.   Length of Stay: 17   Physical Exam Vitals and nursing note reviewed.  Constitutional:      General: She is not in acute distress.    Appearance: She is ill-appearing.     Interventions: Nasal cannula in place.     Comments: 2 L  Cardiovascular:     Rate and Rhythm: Normal rate.  Pulmonary:     Effort: Pulmonary effort is normal. No respiratory distress.  Skin:    General: Skin is warm and dry.  Neurological:     Mental Status: She is alert.  Psychiatric:        Speech: Speech is delayed.  Behavior: Behavior is cooperative.           Vital Signs: BP 117/76   Pulse 94   Temp 97.9 F (36.6 C) (Oral)   Resp 17   Ht 5\' 5"  (1.651 m)   Wt 83.5 kg   LMP 11/01/2021 Comment: Neg Preg Test an Admission to Hospital.  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  She was initially admitted under PCCM service, extubated on 01/18/2022 and transferred to [facility] on 01/21/2022.  Assessment: Goals of care conversation Acute ischemic stroke AKI on CKD 4, stable Acute respiratory failure, resolved Dysphagia Acute on chronic anemia Left foot osteomyelitis DM 1 Chronic diastolic and systolic heart failure  Recommendations/Plan: Continue DNR Continue current care, patient and family hopeful for CIR and improvement of functioning Continue Tylenol 3 times daily, heat/ice if hip pain continues Ongoing  goals of care discussions pending clinical course Provided patient family with patient experience phone number and discussed with RN PMT will continue to follow and support   Prognosis:  Unable to determine  Discharge Planning: CIR  Care plan was discussed with RN, patient, patient's family   MDM [place]         [place], PA-C  Palliative Medicine Team Team phone # [phone]  Thank you for allowing the Palliative Medicine Team to assist in the care of this patient. Please utilize secure chat with additional questions, if there is no response within 30 minutes please call the above phone number.  Palliative Medicine Team providers are available by phone from 7am to 7pm daily and can be reached through the team cell phone.  Should this patient require assistance outside of these hours, please call the patient's attending physician.

## 2022-01-25 NOTE — Progress Notes (Signed)
Occupational Therapy Treatment Patient Details Name: Lauren [name] MRN: [id number] DOB: 11/05/1980 Today's Date: 01/25/2022   History of present illness 41 y/o female presenting 9/8 with gait instability, L-sided facial droop, weakness, and slurred speech. 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 Hospitalization complicated by URI, Rhinovirus positive, acute COPD exacerbation, elevated troponin, possibly due to cardiac injury or Takotsubo cardiomyopathy. L 4th ray bone biopsy 01/16/22, concern for osteomyelitis. Intubated 9/15, extubated 9/18. 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   OT comments  Pt progressing towards established OT goals. Pt with greater command following and awareness of L side of body this date. Pt continues to present with decreased L inattention, balance, strength, coordination and LUE functional use. Pt with some active ab/adduction in LUE this session. Performing sit<>stand transfers with mod A+2 with L lateral lean noted. Squat pivot transfers with max A +2. Performing grooming with mod-max cues for attention to L side. Continues with R gaze preference, but reaching near midline with occlusion glasses and cues to wash LUE. Performing UB bathing to LUE. Due to pt motivation to get to rehab, family support, and significant change in functional status, continue to highly recommend AIR.    Recommendations for follow up therapy are one component of a multi-disciplinary discharge planning process, led by the attending physician.  Recommendations may be updated based on patient status, additional functional criteria and insurance authorization.    Follow Up Recommendations  Acute inpatient rehab  (3hours/day)    Assistance Recommended at Discharge Frequent or constant Supervision/Assistance  Patient can return home with the following  A lot of help with walking and/or transfers;A lot of help with bathing/dressing/bathroom;Two people to help with walking and/or transfers;Two people to help with bathing/dressing/bathroom;Assistance with cooking/housework;Assistance with feeding;Direct supervision/assist for medications management;Direct supervision/assist for financial management;Assist for transportation;Help with stairs or ramp for entrance   Equipment Recommendations  Other (comment) (defer)    Recommendations for Other Services Rehab consult    Precautions / Restrictions Precautions Precautions: Fall Precaution Comments: L inattention, R gaze preference, L hemiplegia, cortrak; left foot bleeding s/p biopsy Restrictions Weight Bearing Restrictions: No Other Position/Activity Restrictions: keeping L LE NWB at this time due to pt constantly bleeding from wound on L foot however appeared to have no active bleeding today       Mobility Bed Mobility Overal bed mobility: Needs Assistance Bed Mobility: Supine to Sit Rolling: Total assist   Supine to sit: Max assist, HOB elevated     General bed mobility comments: pt pulling self up with R UE, despite max verbal cues pt unable to figure out how to pvt hips to R to EOB, pt unable to use L UE andLE functionally during transfer, ultimately maxA via pt pulling up on PT and PT pulling bed pad to bring hips to EOB    Transfers Overall transfer level: Needs assistance Equipment used: 2 person hand held assist (face to face transfer with gait belt and bed pad) Transfers: Sit to/from Stand, Bed to chair/wheelchair/BSC Sit to Stand: +2 physical assistance, Mod assist   Squat pivot transfers: Max assist, +2 physical assistance       General transfer comment: complete 2 sit to stands, pt unable to put L foot down due to flexor  tone,  impaired sensation, L inattention, and anticipated pain with weight bearing due to wound on foot. Pt with strong L lateral lean with posterior bias despite max verbal and tactile cues for PT and OT. ultimately pt required maxAX2 for squat pvt on R foot from EOB to drop arm recliner. pt with poor comprehension of scooting butt back in the chair requiring modAx2 with max verbal cues     Balance Overall balance assessment: Needs assistance Sitting-balance support: Feet supported, Single extremity supported Sitting balance-Leahy Scale: Poor Sitting balance - Comments: min guar assist to maintain balance EOB with tendency to lean posterior and left Postural control: Left lateral lean, Posterior lean Standing balance support: Bilateral upper extremity supported Standing balance-Leahy Scale: Poor Standing balance comment: max A +2 to stand                           ADL either performed or assessed with clinical judgement   ADL Overall ADL's : Needs assistance/impaired     Grooming: Moderate assistance;Sitting;Min guard Grooming Details (indicate cue type and reason): min guard to wash face; cues for washing entirety of face. Mod A for haircare due to fatigue Upper Body Bathing: Moderate assistance;Sitting (in recliner) Upper Body Bathing Details (indicate cue type and reason): Washing LUE with mod A for placement of LUE; RUE performing washing task. Pt with greater ability to compensate (L head turn and switching gaze toward L ~10 degrees during this task)             Toilet Transfer: Maximal assistance;+2 for physical assistance;BSC/3in1 (lateral scoot)           Functional mobility during ADLs: Maximal assistance;+2 for physical assistance;+2 for safety/equipment      Extremity/Trunk Assessment Upper Extremity Assessment Upper Extremity Assessment: RUE deficits/detail RUE Deficits / Details: WFL for tasks assessed in seated position. Pt performing grooming with RUE. MMT  not directly assessed LUE Deficits / Details: No intentional active movement ; some bicep and shoulder extension resistance noted at times with PROM LUE Sensation: decreased light touch (Pt can feel, but sensation is different than R) LUE Coordination: decreased fine motor;decreased gross motor   Lower Extremity Assessment Lower Extremity Assessment: Defer to PT evaluation        Vision   Vision Assessment?: Vision impaired- to be further tested in functional context;Yes Eye Alignment: Within Functional Limits Ocular Range of Motion: Restricted on the left Alignment/Gaze Preference: Gaze right (Improves from 45 degree gaze preference to ~15 degrees gaze preference with glasses taped to occlude peripheral aspect of R visual field bil eyes. (glasses in room)) Tracking/Visual Pursuits: Right eye does not track medially;Left eye does not track laterally (Max difficulty tracking toward L. Pt reaching ~10 degrees past midline toward L while washing L arm) Depth Perception: Undershoots Additional Comments: L neglect. Maintaining midline and slightly past midline for ~30 seconds this date.   Perception Perception Perception: Impaired   Praxis      Cognition Arousal/Alertness: Awake/alert Behavior During Therapy: Flat affect, Impulsive Overall Cognitive Status: Impaired/Different from baseline Area of Impairment: Attention, Following commands, Awareness, Orientation, Safety/judgement                 Orientation Level: Disoriented to, Time, Situation, Place (Stated she was at nursing home. Orieneted to month and year, not day or date.) Current Attention Level: Focused   Following Commands: Follows one step commands inconsistently Safety/Judgement: Decreased awareness of safety, Decreased awareness of deficits (L  inattention) Awareness: Intellectual Problem Solving: Slow processing, Decreased initiation, Difficulty sequencing, Requires verbal cues, Requires tactile cues General  Comments: Oriented to self. Attempting to follow commands with increased time. Pt following commands on R side even when instructed to follow on L; mod cues for problem solving (wash your L arm; and pt attempting to wash r arm with R hand). Slightly impulsive. Max multimodal cues for compensatory head turn for attention to L side of environment. Compensates to midline with min cues.        Exercises Exercises: Other exercises Other Exercises Other Exercises: PROm shoulder elevation and depression. Mod-max tightness in surrounding musculature. Pt reporting pain. Tolerating ~50% PROM    Shoulder Instructions       General Comments VSS, no longer on O2    Pertinent Vitals/ Pain       Pain Assessment Pain Assessment: Faces Faces Pain Scale: Hurts whole lot Pain Location: L LE; LUE with ROM Pain Descriptors / Indicators: Discomfort, Grimacing Pain Intervention(s): Repositioned, Monitored during session  Home Living                                          Prior Functioning/Environment              Frequency  Min 2X/week        Progress Toward Goals  OT Goals(current goals can now be found in the care plan section)  Progress towards OT goals: Progressing toward goals  Acute Rehab OT Goals Patient Stated Goal: go to rehab OT Goal Formulation: With patient Time For Goal Achievement: 02/08/22 Potential to Achieve Goals: Good ADL Goals Pt Will Perform Grooming: with mod assist;standing Pt Will Transfer to Toilet: with +2 assist;with mod assist;stand pivot transfer;bedside commode Additional ADL Goal #1: Pt will demonstrate use of compensatory techniques for protection of LUE with min cues during functional mobility and ADL. Additional ADL Goal #2: Pt will locate at least 3 grooming items in her L environment with mod cues to initiate grooming task  Plan Discharge plan remains appropriate    Co-evaluation    PT/OT/SLP Co-Evaluation/Treatment:  Yes Reason for Co-Treatment: Complexity of the patient's impairments (multi-system involvement) PT goals addressed during session: Mobility/safety with mobility OT goals addressed during session: ADL's and self-care      AM-PAC OT "6 Clicks" Daily Activity     Outcome Measure   Help from another person eating meals?: Total Help from another person taking care of personal grooming?: A Lot Help from another person toileting, which includes using toliet, bedpan, or urinal?: Total Help from another person bathing (including washing, rinsing, drying)?: A Lot Help from another person to put on and taking off regular upper body clothing?: A Lot Help from another person to put on and taking off regular lower body clothing?: Total 6 Click Score: 9    End of Session Equipment Utilized During Treatment: Gait belt  OT Visit Diagnosis: Unsteadiness on feet (R26.81);Other abnormalities of gait and mobility (R26.89);Muscle weakness (generalized) (M62.81);Hemiplegia and hemiparesis Hemiplegia - Right/Left: Left Hemiplegia - dominant/non-dominant: Dominant Hemiplegia - caused by: Cerebral infarction   Activity Tolerance Patient tolerated treatment well   Patient Left in chair;with call bell/phone within reach;with chair alarm set   Nurse Communication Mobility status (up to chair)        Time: 3267-1245 OT Time Calculation (min): 38 min  Charges: OT General Charges $OT Visit: 1  Visit OT Treatments $Self Care/Home Management : 23-37 mins  Shanda Howells, OTR/L [facility] Office: [phone]   Lula Olszewski 01/25/2022, 9:38 AM

## 2022-01-25 NOTE — Progress Notes (Signed)
Pt is still hospitalized   Lauren [name], [place], [place] [address] Direct Dial: [phone] [name]

## 2022-01-25 NOTE — Progress Notes (Signed)
PROGRESS NOTE    Lauren [name]  URK:[id number] DOB: 1980/09/06 DOA: 01/08/2022 PCP: Lemmie Evens, MD   Brief Narrative:  41 yr old female with a history of CAD, chronic diastolic heart failure, diabetes mellitus type 2 with peripheral neuropathy with recent arterial thrombectomy to the right upper extremity due to limb ischemia, CKD stage IIIa, peripheral arterial disease was admitted with right MCA CVA and V3 segment of the right vertebral artery stenosis with left ICA stenosis.  She was initially admitted under PCCM service, extubated on 01/18/2022 and transferred to [facility] on 01/21/2022.   Significant Events: 9/8 Admitted with left sided weakness, facial droop.  NIHSS 8, to IR  for thrombectomy, intubated.  MRSA PCR +, Rhinovirus + 9/10 PSV wean, moving RUE/RLE, extubated  9/11 ECHO>, CT head negative for acute changes, Trach asp negative,  9/13 started on HFNC 10L overnight 9/14 tmax 101.2, remains on HFNC, MRI, Heme/onc and ID consult 9/18 extubated, infectious disease change antimicrobial coverage to cefadroxil and Flagyl 9/19: Required Precedex overnight for restlessness.  Off by a.m. rounds   Significant studies: CT head Periventricular white matter hypodensities adjacent to the frontal horn of the right lateral ventricles and in the right corona radiata are new since prior exam. This may represent acute ischemia. CTA head & neck - Age indeterminate occlusion of the proximal right ICA in the neck with non opacification distally in the upper neck and intracranially. Right MCA and A1 ACA are patent, but diminutive. Severe left paraclinoid ICA stenosis. CT perfusion, no evidence of core infarct or penumbra; however, there is approximately 38 mL of T-max greater than 4 seconds in the right MCA territory which suggests possible oligemia. IR with TICI3 of intracranial R ICA, however complicated by ICA dissection status post telescope stents Post IR CT - Redemonstrated hypodensity in the  right frontal WM MRI x 2 Extensive patchy areas of acute infarction involving the right MCA territory, most confluent areas along the watershed. 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)   Hypothyroidism   CAD (coronary artery disease)   Acute renal failure superimposed on stage 4 chronic kidney disease (HCC)   Tobacco abuse   Class 2 obesity due to excess calories without serious comorbidity with body mass index (BMI) of 39.0 to 39.9 in adult   Acute osteomyelitis of left foot ([place])   Internal carotid artery occlusion, right   Acute respiratory failure with hypoxia (HCC)   HFrEF (heart failure with reduced ejection fraction) (HCC)  Acute ischemic stroke of the right MCA and punctuated left MCA/ACA infarct with right ICA occlusion, s/p repair with TICI, 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  -Etiology unclear likely large vessel disease, cannot rule out cardioembolic source -Initially admitted to ICU under PCCM service, had to be intubated -2D echo showed EF of 30% with LDL 39, A1c 8.0 -Hypercoagulable work-up showed abnormal lupus anticoagulant but was also on heparin, slightly increased rheumatoid factor -Plan to repeat labs in 3 months and follow-up hematology as outpatient; hematology has been notified -She was previously on Eliquis and now switched to Lovenox per oncology recommendation, she should be on Lovenox therapeutically.  Patient is also on [place] to go to inpatient rehab  Dysphagia: Patient unable to safely swallow.  She is on tube feedings.  SLP following.  Patient chose to be DNR.  She is not sure if she would like PEG tube if needed.  We we will wait for palliative care to have further discussions  with the patient.   History of limb ischemia/hypercoagulability - recent h/o  right upper extremity ischemia -S/p thrombectomy -Patient was on Eliquis PTA -Switch to Lovenox per oncology recommendation.  -She will need hematology follow-up as outpatient   Acute respiratory failure with hypoxia/COPD exacerbation/rhinovirus -Resolved -She was initially intubated and admitted under PCCM service, now extubated -She was started on IV Lasix which has been discontinued -Chest x-ray showed right infrahilar opacity which has improved, procalcitonin high at 1.18 -She is currently not requiring oxygen, O2 sats 98% on room air -Continue Brovana, Pulmicort   Acute kidney injury on CKD stage IV -Early contrast-induced nephropathy, creatinine peaked at 5 -Creatinine is down to 2.6 and has remained stable since 01/22/2022. -Lasix is currently on hold -Follow BMP in am   Chronic diastolic and systolic heart failure:  -2D echo showed EF of 35%, grade 2 diastolic dysfunction;With wall motion abnormality to the LAD distribution question obstruction versus stress-induced cardiomyopathy. -Euvolemic -Cardiology was consulted and relates this may be from demand ischemia from CHF, she will need ischemic evaluation as outpatient -She was started on Imdur, metoprolol, statin; continue on Brilinta -No ACE inhibitor/ARB due to renal dysfunction  Acute on chronic anemia: Hemoglobin dropped to 6.9 on 01/23/2022, received 1 unit of PRBC transfusion.  Hemoglobin over 7.4 on 01/24/2022 but drifting down and 7.0 today.  We will repeat tomorrow and transfuse if less than 7.  Per patient, she is bleeding intermittently from her foot.  No other source of bleeding is obvious at the moment.  Unfortunately, she does need anticoagulation as well.  If we continue to see the trend of dropping hemoglobin, we may need to consider holding anticoagulation for couple of days.   Hypertension -Patient is currently on metoprolol,  Imdur -Blood pressure well controlled   Diabetes mellitus type 1/DKA on admission -DKA has resolved.  Patient is on Semglee 30 units twice daily.  She was hypoglycemic last evening.  We will reduce that to 25 units and continue SSI.  She is on tube feedings.   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 Recommended to continue cefadroxil and Flagyl, andstop antibiotics if culture is negative. Abx have been stopped. -will require ID follow-up as an outpatient. ID was consulted ; culture from bone biopsy performed on 01/16/2022 negative till date.   Leukocytosis Discussed with ID regarding persistent leukocytosis -Does not seem to be infectious She was on steroids till 9/16, may be reactive.  Follow CBC.   Class 2 obesity due to excess calories without serious comorbidity with body mass index (BMI) of 39.0 to 39.9  in adult: Noted.   Tobacco abuse: Current smoker she has been counseled, nicotine patch was placed.  DVT prophylaxis: SCDs Start: 01/08/22 1926   Code Status: DNR  Family Communication: None present at bedside.   Status is: Inpatient Remains inpatient appropriate because: Pending placement to CIR.   Estimated body mass index is 30.63 kg/m as calculated from the following:   Height as of this encounter: _0  (1.651 m).   Weight  as of this encounter: 83.5 kg.    Nutritional Assessment: Body mass index is 30.63 kg/m.Marland Kitchen Seen by dietician.  I agree with the assessment and plan as outlined below: Nutrition Status: Nutrition Problem: Inadequate oral intake Etiology: inability to eat Signs/Symptoms: NPO status Interventions: Tube feeding, Prostat  . Skin  Assessment: I have examined the patient's skin and I agree with the wound assessment as performed by the wound care RN as outlined below:    Consultants:  Hematology Infectious disease Podiatry Nephrology Cardiology  Procedures:  As above  Antimicrobials:  Anti-infectives (From admission, onward)    Start     Dose/Rate Route Frequency Ordered Stop   01/18/22 1330  cefadroxil (DURICEF) capsule 500 mg  Status:  Discontinued        500 mg Per Tube 2 times daily 01/18/22 1231 01/21/22 0759   01/18/22 1330  metroNIDAZOLE (FLAGYL) tablet 500 mg  Status:  Discontinued        500 mg Per Tube Every 12 hours 01/18/22 1231 01/21/22 0759   01/16/22 1700  ciprofloxacin (CIPRO) IVPB 400 mg  Status:  Discontinued        400 mg 200 mL/hr over 60 Minutes Intravenous Every 24 hours 01/16/22 1015 01/18/22 1231   01/16/22 1200  vancomycin (VANCOREADY) IVPB 500 mg/100 mL  Status:  Discontinued        500 mg 100 mL/hr over 60 Minutes Intravenous Every 24 hours 01/16/22 1018 01/18/22 1231   01/16/22 0400  ciprofloxacin (CIPRO) IVPB 200 mg  Status:  Discontinued        200 mg 100 mL/hr over 60 Minutes Intravenous Every 12 hours 01/15/22 1244 01/16/22 1015   01/15/22 1330  vancomycin (VANCOREADY) IVPB 1750 mg/350 mL        1,750 mg 175 mL/hr over 120 Minutes Intravenous  Once 01/15/22 1240 01/15/22 1600   01/15/22 1330  ciprofloxacin (CIPRO) IVPB 200 mg        200 mg 100 mL/hr over 60 Minutes Intravenous  Once 01/15/22 1244 01/15/22 1722   01/15/22 1249  vancomycin variable dose per unstable renal function (pharmacist dosing)  Status:  Discontinued         Does not apply See admin instructions 01/15/22 1249 01/16/22 1019   01/09/22 2245  amoxicillin-clavulanate (AUGMENTIN) 500-125 MG per tablet 500 mg  Status:  Discontinued        1 tablet Per Tube Every 12 hours 01/09/22 2155 01/15/22 0912   01/09/22 2200  amoxicillin-clavulanate (AUGMENTIN) 875-125 MG per tablet 1 tablet  Status:  Discontinued         1 tablet Per Tube Every 12 hours 01/09/22 1055 01/09/22 1630   01/09/22 2200  amoxicillin-clavulanate (AUGMENTIN) 500-125 MG per tablet 500 mg  Status:  Discontinued        1 tablet Oral Every 12 hours 01/09/22 1630 01/09/22 2155   01/09/22 1030  amoxicillin-clavulanate (AUGMENTIN) 875-125 MG per tablet 1 tablet  Status:  Discontinued        1 tablet Oral Every 12 hours 01/09/22 0935 01/09/22 1055   01/08/22 1425  ceFAZolin (ANCEF) 2-4 GM/100ML-% IVPB       Note to Pharmacy: Wandalee Ferdinand J: cabinet override      01/08/22 1425 01/08/22 2035         Subjective:  Patient seen and examined.  She has no new complaint today.  Objective: Vitals:   01/24/22 2336 01/25/22 0500 01/25/22 0511 01/25/22 0919  BP:   115/71   Pulse: 96  94  Resp: 18   20  Temp: (!) 97.5 F (36.4 C)     TempSrc: Axillary     SpO2: 100%     Weight:  83.5 kg    Height:        Intake/Output Summary (Last 24 hours) at 01/25/2022 1041 Last data filed at 01/24/2022 1637 Gross per 24 hour  Intake --  Output 650 ml  Net -650 ml    Filed Weights   01/22/22 0500 01/23/22 0500 01/25/22 0500  Weight: 83.9 kg 83.9 kg 83.5 kg    Examination:  General exam: Appears calm and comfortable  Respiratory system: Clear to auscultation. Respiratory effort normal. Cardiovascular system: S1 & S2 heard, RRR. No JVD, murmurs, rubs, gallops or clicks. No pedal edema. Gastrointestinal system: Abdomen is nondistended, soft and nontender. No organomegaly or masses felt. Normal bowel sounds heard. Central nervous system: Alert and oriented.  Left hemiparesis, left facial droop, dysarthria. Skin: No rashes, lesions or ulcers.  Psychiatry: Judgement and insight appear normal. Mood & affect appropriate.    Data Reviewed: I have personally reviewed following labs and imaging studies  CBC: Recent Labs  Lab 01/22/22 0443 01/23/22 0640 01/23/22 0704 01/24/22 0336 01/25/22 0550  WBC 23.5* 21.1* 21.2* 18.9* 19.0*   NEUTROABS  --   --  15.8* 13.2* 17.7*  HGB 7.3* 6.9* 7.0* 7.4* 7.0*  HCT 22.5* 21.5* 21.7* 22.7* 22.0*  MCV 89.3 91.5 90.0 83.8 84.3  PLT 734* 788* 801* 734* 736*    Basic Metabolic Panel: Recent Labs  Lab 01/22/22 0443 01/23/22 0640 01/23/22 0704 01/23/22 1500 01/24/22 0336 01/25/22 0550  NA 136 138  --  140 139 134*  K 4.5 4.6  --  4.7 4.5 4.3  CL 94* 98  --  100 102 99  CO2 28 26  --  _0 GLUCOSE 196* 135*  --  165* 135* 290*  BUN 89* 93*  --  92* 91* 77*  CREATININE 2.62* 2.71*  --  2.71* 2.69* 2.34*  CALCIUM 8.8* 8.9  --  8.9 8.7* 8.4*  MG  --   --  3.1* 3.2* 3.2* 3.0*  PHOS  --   --  6.9* 6.9* 7.3* 6.5*    GFR: Estimated Creatinine Clearance: 34.4 mL/min (A) (by C-G formula based on SCr of 2.34 mg/dL (H)). Liver Function Tests: Recent Labs  Lab 01/23/22 0704 01/24/22 0336 01/25/22 0550  AST 33 37 39  ALT 28 31 33  ALKPHOS 94 93 110  BILITOT 0.3 0.6 0.5  PROT 6.3* 6.0* 6.1*  ALBUMIN 2.3* 2.3* 2.4*    No results for input(s): "LIPASE", "AMYLASE" in the last 168 hours. No results for input(s): "AMMONIA" in the last 168 hours. Coagulation Profile: No results for input(s): "INR", "PROTIME" in the last 168 hours. Cardiac Enzymes: No results for input(s): "CKTOTAL", "CKMB", "CKMBINDEX", "TROPONINI" in the last 168 hours. BNP (last 3 results) No results for input(s): "PROBNP" in the last 8760 hours. HbA1C: No results for input(s): "HGBA1C" in the last 72 hours. CBG: Recent Labs  Lab 01/24/22 2105 01/24/22 2140 01/25/22 0031 01/25/22 0347 01/25/22 0842  GLUCAP 72 103* 201* 281* 272*    Lipid Profile: No results for input(s): "CHOL", "HDL", "LDLCALC", "TRIG", "CHOLHDL", "LDLDIRECT" in the last 72 hours. Thyroid Function Tests: No results for input(s): "TSH", "T4TOTAL", "FREET4", "T3FREE", "THYROIDAB" in the last 72 hours. Anemia Panel: No results for input(s): "VITAMINB12", "FOLATE", "FERRITIN", "TIBC", "IRON", "RETICCTPCT" in the last 72  hours. Sepsis  Labs: Recent Labs  Lab 01/21/22 0830  PROCALCITON 1.18     Recent Results (from the past 240 hour(s))  Culture, BAL-quantitative w Gram Stain     Status: Abnormal   Collection Time: 01/15/22  3:09 PM   Specimen: Bronchoalveolar Lavage; Respiratory  Result Value Ref Range Status   Specimen Description BRONCHIAL ALVEOLAR LAVAGE  Final   Special Requests NONE  Final   Gram Stain   Final    RARE WBC PRESENT,BOTH PMN AND MONONUCLEAR NO ORGANISMS SEEN    Culture (A)  Final    1,000 COLONIES/mL STAPHYLOCOCCUS EPIDERMIDIS CALL MICROBIOLOGY LAB IF SENSITIVITIES ARE REQUIRED. Performed at [facility], [place] [address] [place], [place] [address]    Report Status 01/18/2022 FINAL  Final  Culture, fungus without smear     Status: None (Preliminary result)   Collection Time: 01/15/22  3:09 PM   Specimen: Bronchoalveolar Lavage; Other  Result Value Ref Range Status   Specimen Description BRONCHIAL ALVEOLAR LAVAGE  Final   Special Requests NONE  Final   Culture   Final    NO FUNGUS ISOLATED AFTER 8 DAYS Performed at [facility], [address], [place], [place] [address]    Report Status PENDING  Incomplete  Aerobic/Anaerobic Culture w Gram Stain (surgical/deep wound)     Status: None   Collection Time: 01/16/22 11:48 AM   Specimen: Bone; Tissue  Result Value Ref Range Status   Specimen Description BONE  Final   Special Requests 4TH METATARSAL LEFT FOOT  Final   Gram Stain NO WBC SEEN NO ORGANISMS SEEN   Final   Culture   Final    No growth aerobically or anaerobically. Performed at [facility], [place] [address], [place], [place] [address]    Report Status 01/21/2022 FINAL  Final     Radiology Studies: DG CHEST PORT 1 VIEW  Result Date: 01/24/2022 CLINICAL DATA:  Code stroke.  Hypoxia. EXAM: PORTABLE CHEST 1 VIEW COMPARISON:  January 19, 2022 FINDINGS: Feeding catheter tip collimated off the image. High density material within the stomach.  Left IJ approach central venous catheter tip at the expected location of the cavoatrial junction. Cardiomediastinal silhouette is normal. Mediastinal contours appear intact. There is no evidence of focal airspace consolidation, pleural effusion or pneumothorax. Osseous structures are without acute abnormality. Soft tissues are grossly normal. IMPRESSION: No active disease. Electronically Signed   By: Fidela Salisbury M.D.   On: 01/24/2022 19:48    Scheduled Meds:  acetaminophen  500 mg Oral TID   arformoterol  15 mcg Nebulization BID   atorvastatin  40 mg Per Tube Daily   budesonide (PULMICORT) nebulizer solution  0.5 mg Nebulization BID   Chlorhexidine Gluconate Cloth  6 each Topical Daily   docusate  100 mg Per Tube BID   enoxaparin (LOVENOX) injection  80 mg Subcutaneous Q24H   free water  100 mL Per Tube Q4H   hydrALAZINE  12.5 mg Per Tube Q8H   insulin aspart  0-20 Units Subcutaneous Q4H   insulin aspart  6 Units Subcutaneous Q4H   insulin detemir  25 Units Subcutaneous BID   isosorbide mononitrate  20 mg Per Tube BID   levothyroxine  75 mcg Per Tube Q0600   melatonin  3 mg Per Tube QHS   metoprolol succinate  25 mg Oral Once   metoprolol succinate  75 mg Oral Daily   nicotine  21 mg Transdermal Daily   pantoprazole  40 mg  Per Tube Daily   revefenacin  175 mcg Nebulization Daily   ticagrelor  90 mg Oral BID   Or   ticagrelor  90 mg Per Tube BID   Continuous Infusions:  feeding supplement (GLUCERNA 1.5 CAL) 55 mL/hr at 01/23/22 1500     LOS: 17 days   Darliss Cheney, MD [facility]  01/25/2022, 10:41 AM   *Please note that this is a verbal dictation therefore any spelling or grammatical errors are due to the "[place] [employer]" system interpretation.  Please page via [place] and do not message via secure chat for urgent patient care matters. Secure chat can be used for non urgent patient care matters.  How to contact the [facility] Attending or Consulting provider [place]  or covering provider during after hours [place], for this patient?  Check the care team in [facility] and look for a) attending/consulting TR[facility] provider listed and b) the [facility] team listed. Page or secure chat [phone]. Log into [email or web address] and use [place] universal password to access. If you do not have the password, please contact the hospital operator. Locate the [facility] provider you are looking for under [facility] and page to a number that you can be directly reached. If you still have difficulty reaching the provider, please page the [facility] (Director on Call) for the Hospitalists listed on amion for assistance.

## 2022-01-25 NOTE — Progress Notes (Signed)
Nutrition Follow-up  DOCUMENTATION CODES:  Not applicable  INTERVENTION:  Continue tube feeding via Cortrak tube: Glucerna 1.5 at goal rate of 55 ml/h (1320 ml per day) Prosource TF20 60 ml daily   Provides 2060 kcal, 128 gm protein, 1003 ml free water daily  NUTRITION DIAGNOSIS:  Inadequate oral intake related to inability to eat as evidenced by NPO status. - Ongoing  GOAL:  Patient will meet greater than or equal to 90% of their needs - Goal met via TF  MONITOR:   Diet advancement, TF tolerance  REASON FOR ASSESSMENT:   Consult Enteral/tube feeding initiation and management  ASSESSMENT:   Pt with PMH of CAD with MI, CHF with preserved EF, HTN, uncontrolled DM, strokes, R arm ischemia, CKD stage IV, PVD, L foot DM ulcer s/p amputation of 5th toe 08/2021 with concern of osteomyelitis of 4th toe admitted with acute R MCA stroke s/p thrombectomy, R ICA dissection repair, and stent placement.  9/15 re-intubated d/t pulmonary edema 9/15 s/p L fourth metatarsal bone biopsy, results pending  9/18 extubated  Admit weight: 91 kg Current weight: 84.6 kg  Average Meal Intake: ***-***: ***% intake x *** recorded meals  Nutritionally Relevant Medications: Scheduled Meds:  acetaminophen  500 mg Oral TID   arformoterol  15 mcg Nebulization BID   atorvastatin  40 mg Per Tube Daily   budesonide (PULMICORT) nebulizer solution  0.5 mg Nebulization BID   Chlorhexidine Gluconate Cloth  6 each Topical Daily   docusate  100 mg Per Tube BID   enoxaparin (LOVENOX) injection  80 mg Subcutaneous Q24H   free water  100 mL Per Tube Q4H   hydrALAZINE  12.5 mg Per Tube Q8H   insulin aspart  0-20 Units Subcutaneous Q4H   insulin aspart  6 Units Subcutaneous Q4H   insulin detemir  25 Units Subcutaneous BID   isosorbide mononitrate  20 mg Per Tube BID   levothyroxine  75 mcg Per Tube Q0600   melatonin  3 mg Per Tube QHS   metoprolol succinate  25 mg Oral Once   metoprolol succinate  75 mg  Oral Daily   nicotine  21 mg Transdermal Daily   pantoprazole  40 mg Per Tube Daily   revefenacin  175 mcg Nebulization Daily   ticagrelor  90 mg Oral BID   Or   ticagrelor  90 mg Per Tube BID   Continuous Infusions:  feeding supplement (GLUCERNA 1.5 CAL) 55 mL/hr at 01/23/22 1500   PRN Meds:.acetaminophen **OR** acetaminophen (TYLENOL) oral liquid 160 mg/5 mL **OR** acetaminophen, docusate, influenza vac split quadrivalent PF, ipratropium-albuterol, metoprolol tartrate, nitroGLYCERIN, mouth rinse, pneumococcal 20-valent conjugate vaccine, polyethylene glycol, senna-docusate  Labs Reviewed: ***  Diet Order:   Diet Order             DIET DYS 2 Room service appropriate? Yes with Assist; Fluid consistency: Nectar Thick  Diet effective now                   EDUCATION NEEDS:   No education needs have been identified at this time  Skin:  Skin Assessment: Reviewed RN Assessment  Last BM:  9/17 (type 7 via FMS)  Height:   Ht Readings from Last 1 Encounters:  01/13/22 _0  (1.651 m)    Weight:   Wt Readings from Last 1 Encounters:  01/25/22 83.5 kg   BMI:  Body mass index is 30.63 kg/m.  Estimated Nutritional Needs:   Kcal:  2000-2200  Protein:  105-120g  Fluid:  2 L/day  [place] Dana, RDN, LDN Clinical Nutrition

## 2022-01-25 NOTE — Progress Notes (Signed)
   01/25/22 1330  Clinical Encounter Type  Visited With Patient not available  Visit Type Initial;Other (Comment) (Advanced Directive)  Referral From Nurse  Consult/Referral To Chaplain   Chaplain responded to  a spiritual consult for advanced directive education. The patient was receiving care as the time of the visit.   Danice Goltz [facility]  [phone]

## 2022-01-26 LAB — PHOSPHORUS: Phosphorus: 5.9 mg/dL — ABNORMAL HIGH (ref 2.5–4.6)

## 2022-01-26 LAB — CBC WITH DIFFERENTIAL/PLATELET
Abs Immature Granulocytes: 0.32 10*3/uL — ABNORMAL HIGH (ref 0.00–0.07)
Basophils Absolute: 0.1 10*3/uL (ref 0.0–0.1)
Basophils Relative: 0 %
Eosinophils Absolute: 0.4 10*3/uL (ref 0.0–0.5)
Eosinophils Relative: 3 %
HCT: 20.4 % — ABNORMAL LOW (ref 36.0–46.0)
Hemoglobin: 6.5 g/dL — CL (ref 12.0–15.0)
Immature Granulocytes: 3 %
Lymphocytes Relative: 19 %
Lymphs Abs: 2.3 10*3/uL (ref 0.7–4.0)
MCH: 26.9 pg (ref 26.0–34.0)
MCHC: 31.9 g/dL (ref 30.0–36.0)
MCV: 84.3 fL (ref 80.0–100.0)
Monocytes Absolute: 0.7 10*3/uL (ref 0.1–1.0)
Monocytes Relative: 6 %
Neutro Abs: 8.3 10*3/uL — ABNORMAL HIGH (ref 1.7–7.7)
Neutrophils Relative %: 69 %
Platelets: 648 10*3/uL — ABNORMAL HIGH (ref 150–400)
RBC: 2.42 MIL/uL — ABNORMAL LOW (ref 3.87–5.11)
RDW: 20.3 % — ABNORMAL HIGH (ref 11.5–15.5)
WBC: 12 10*3/uL — ABNORMAL HIGH (ref 4.0–10.5)
nRBC: 0 % (ref 0.0–0.2)

## 2022-01-26 LAB — COMPREHENSIVE METABOLIC PANEL
ALT: 29 U/L (ref 0–44)
AST: 31 U/L (ref 15–41)
Albumin: 2.3 g/dL — ABNORMAL LOW (ref 3.5–5.0)
Alkaline Phosphatase: 98 U/L (ref 38–126)
Anion gap: 6 (ref 5–15)
BUN: 64 mg/dL — ABNORMAL HIGH (ref 6–20)
CO2: 28 mmol/L (ref 22–32)
Calcium: 8.4 mg/dL — ABNORMAL LOW (ref 8.9–10.3)
Chloride: 102 mmol/L (ref 98–111)
Creatinine, Ser: 2.05 mg/dL — ABNORMAL HIGH (ref 0.44–1.00)
GFR, Estimated: 31 mL/min — ABNORMAL LOW (ref >60)
Glucose, Bld: 119 mg/dL — ABNORMAL HIGH (ref 70–99)
Potassium: 4 mmol/L (ref 3.5–5.1)
Sodium: 136 mmol/L (ref 135–145)
Total Bilirubin: 0.7 mg/dL (ref 0.3–1.2)
Total Protein: 5.8 g/dL — ABNORMAL LOW (ref 6.5–8.1)

## 2022-01-26 LAB — GLUCOSE, CAPILLARY
Glucose-Capillary: 129 mg/dL — ABNORMAL HIGH (ref 70–99)
Glucose-Capillary: 139 mg/dL — ABNORMAL HIGH (ref 70–99)
Glucose-Capillary: 155 mg/dL — ABNORMAL HIGH (ref 70–99)
Glucose-Capillary: 264 mg/dL — ABNORMAL HIGH (ref 70–99)
Glucose-Capillary: 273 mg/dL — ABNORMAL HIGH (ref 70–99)
Glucose-Capillary: 99 mg/dL (ref 70–99)

## 2022-01-26 LAB — HEMOGLOBIN AND HEMATOCRIT, BLOOD
HCT: 24.9 % — ABNORMAL LOW (ref 36.0–46.0)
Hemoglobin: 8.3 g/dL — ABNORMAL LOW (ref 12.0–15.0)

## 2022-01-26 LAB — MAGNESIUM: Magnesium: 2.8 mg/dL — ABNORMAL HIGH (ref 1.7–2.4)

## 2022-01-26 LAB — PREPARE RBC (CROSSMATCH)

## 2022-01-26 MED ORDER — SODIUM CHLORIDE 0.9% IV SOLUTION: Freq: Once | INTRAVENOUS | Status: AC

## 2022-01-26 MED ORDER — GLUCERNA 1.5 CAL PO LIQD
1000.0000 mL | ORAL | Status: DC
  Administered 2022-01-26: 1000 mL
  Filled 2022-01-26 (×2): qty 1000

## 2022-01-26 NOTE — Progress Notes (Signed)
Physical Therapy Treatment Patient Details Name: Lauren [name] MRN: [id number] DOB: 12/13/1980 Today's Date: 01/26/2022   History of Present Illness 41 y/o female presenting 9/8 with gait instability, L-sided facial droop, weakness, and slurred speech. 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 Hospitalization complicated by URI, Rhinovirus positive, acute COPD exacerbation, elevated troponin, possibly due to cardiac injury or Takotsubo cardiomyopathy. L 4th ray bone biopsy 01/16/22, concern for osteomyelitis. Intubated 9/15, extubated 9/18. 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    PT Comments    Pt very eager to mobilize today trying to get herself out of bed, even reaching over side of bed with R UE to let bed rail down. Pt continues with dense L hemiparesis, impaired L UE and LE sensation, L neglect requiring max verbal cues to tend to the left t/o session, decreased attn span/ability to focus on task, poor short term memory, as well as decreased insight to safety and deficits. Pt did follow simple commands consistently today. Focused on tending to L side today in addition to static sitting balance and sit to stand transfers. Pt continues to require assistx2 for OOB Mobility. Pt to greatly benefit from AIR upon d/c for maximal functional recovery to decrease burden of care on family.    Recommendations for follow up therapy are one component of a multi-disciplinary discharge planning process, led by the attending physician.  Recommendations may be updated based on patient status, additional functional criteria and insurance authorization.  Follow Up Recommendations  Acute inpatient rehab (3hours/day)     Assistance  Recommended at Discharge Frequent or constant Supervision/Assistance  Patient can return home with the following Assist for transportation;Assistance with cooking/housework;Two people to help with walking and/or transfers;A lot of help with bathing/dressing/bathroom;Help with stairs or ramp for entrance   Equipment Recommendations  Other (comment) (defer to post acute)    Recommendations for Other Services Rehab consult     Precautions / Restrictions Precautions Precautions: Fall Precaution Comments: L inattention, R gaze preference, L hemiplegia, cortrak; L foot dressing Required Braces or Orthoses: Other Brace (L foot in large dressing) Restrictions Weight Bearing Restrictions: No     Mobility  Bed Mobility Overal bed mobility: Needs Assistance Bed Mobility: Supine to Sit     Supine to sit: Max assist, HOB elevated     General bed mobility comments: pt pulling self up with R UE, despite max verbal cues pt unable to figure out how to pvt hips to R to EOB, pt unable to use L UE andLE functionally during transfer, ultimately maxA via pt pulling up on PT and PT pulling bed pad to bring hips to EOB    Transfers Overall transfer level: Needs assistance Equipment used: 2 person hand held assist (face to face transfer with gait belt and bed pad) Transfers: Sit to/from Stand, Bed to chair/wheelchair/BSC Sit to Stand: +2 physical assistance, Mod assist   Step pivot transfers: Max assist, +2 physical assistance       General transfer comment: Pt with multiple accounts of initiating sit to stand with anterior weight shift however ultimately required modAX2 to power up and complete full upright standing. Completed 4 sit to stands with L knee blocked, max verbal cues to hold head up, stick chest out  and tactile cues to extend L hip and knee, pt with strong L lateral lean requiring maxA to maintain standing tolerance, pt quick to return to sitting despite verbal cues to stay standing     Ambulation/Gait               General Gait Details: unable at this time   Stairs             Wheelchair Mobility    Modified Rankin (Stroke Patients Only) Modified Rankin (Stroke Patients Only) Pre-Morbid Rankin Score: No symptoms Modified Rankin: Severe disability     Balance Overall balance assessment: Needs assistance Sitting-balance support: Feet supported, Single extremity supported Sitting balance-Leahy Scale: Poor Sitting balance - Comments: min guard assist to maintain balance EOB however pt laying self backwards across bed multiple times. Pillow placed on bed rail to prevent pt from hitting head on bed rail. Attempted to worked on weight bearing on L UE however pt unable to tend to task with out maxA and verbal/tactile cues Postural control: Left lateral lean, Posterior lean Standing balance support: Bilateral upper extremity supported Standing balance-Leahy Scale: Poor Standing balance comment: max A +2 to stand                            Cognition Arousal/Alertness: Awake/alert Behavior During Therapy: Impulsive, Restless Overall Cognitive Status: Impaired/Different from baseline Area of Impairment: Attention, Following commands, Awareness, Orientation, Safety/judgement, Memory, Problem solving                 Orientation Level: Disoriented to, Time, Situation, Place Current Attention Level: Focused Memory: Decreased short-term memory Following Commands: Follows one step commands inconsistently, Follows one step commands with increased time Safety/Judgement: Decreased awareness of safety, Decreased awareness of deficits (L inattention) Awareness: Emergent (states "I need a bath, I stink") Problem Solving: Difficulty sequencing, Requires verbal cues, Requires tactile cues General Comments: pt quick to move with decreased insight to safety and deficits. Pt with severe L neglect. With cues pt would tend to L side however poor  carryover/memory to constantly look to the L when looking for items or her L UE and LE. Pt very restless in the bed.        Exercises      General Comments General comments (skin integrity, edema, etc.): VSS on RA      Pertinent Vitals/Pain Pain Assessment Pain Assessment: No/denies pain Pain Location: denies pain today even when L foot in dependent position    Home Living                          Prior Function            PT Goals (current goals can now be found in the care plan section) Acute Rehab PT Goals PT Goal Formulation: With family Time For Goal Achievement: 02/08/22 Potential to Achieve Goals: Good Progress towards PT goals: Progressing toward goals    Frequency    Min 4X/week      PT Plan Current plan remains appropriate    Co-evaluation              AM-PAC PT "6 Clicks" Mobility   Outcome Measure  Help needed turning from your back to your side while in a flat bed without using bedrails?: A Lot Help needed moving from lying on your back to sitting on the side of a flat bed without using bedrails?: A Lot  Help needed moving to and from a bed to a chair (including a wheelchair)?: A Lot Help needed standing up from a chair using your arms (e.g., wheelchair or bedside chair)?: A Lot Help needed to walk in hospital room?: Total Help needed climbing 3-5 steps with a railing? : Total 6 Click Score: 10    End of Session Equipment Utilized During Treatment: Gait belt Activity Tolerance: Patient tolerated treatment well Patient left: with call bell/phone within reach;in chair;with chair alarm set Nurse Communication: Mobility status PT Visit Diagnosis: Muscle weakness (generalized) (M62.81);Hemiplegia and hemiparesis;Other abnormalities of gait and mobility (R26.89) Hemiplegia - Right/Left: Left Hemiplegia - dominant/non-dominant: Non-dominant Hemiplegia - caused by: Cerebral infarction     Time: 1140-1211 PT Time Calculation (min)  (ACUTE ONLY): 31 min  Charges:  $Therapeutic Activity: 8-22 mins $Neuromuscular Re-education: 8-22 mins                     Kittie Plater, PT, DPT Acute Rehabilitation Services Secure chat preferred Office #: [phone]    Berline Lopes 01/26/2022, 1:25 PM

## 2022-01-26 NOTE — Progress Notes (Signed)
Inpatient Rehabilitation Admissions Coordinator   Insurance has asked for updated clinicals today to extend Auth due to not admitted to CIR yet. I will provide them today. Noted Hgb continues to decrease requiring transfusion. We will follow.  Danne Baxter, RN, MSN Rehab Admissions Coordinator [phone] 01/26/2022 10:52 AM

## 2022-01-26 NOTE — Progress Notes (Signed)
Speech Language Pathology Treatment: Dysphagia  Patient Details Name: Lauren [name] MRN: [id number] DOB: 04-19-1981 Today's Date: 01/26/2022 Time: 1420-1440 SLP Time Calculation (min) (ACUTE ONLY): 20 min  Assessment / Plan / Recommendation Clinical Impression  Pts intake has been poor, has been refusing most hospital food to the point that there have been discussions about PEG tube placement. Pt adamantly states she does not want a PEG tube, states she would rather eat foods she likes than guard against a minor dysphagia. Duaghter has brought pt a McDouble and fed it to her in small pieces and RN confirms pt did well and daughter fed her safely. Today pt is able to masticate a graham cracker peanut butter sandwich and sip thin coke. SLP provided pacing and postural support. Aspiration severity mild on MBS, recommend diet upgrade to encourage oral intake with basic precautions. Daughter and husband in agreement. Will f/u for tolerance.   HPI HPI: Lauren [name] is a 41 y.o. female who presented to the ED for acute onset of chest pain with left side weakness. CT head right MCA and vertebral stroke; underwent IR thrombectomy of R ICA with R ICA dissection repair and stent placement with TICI 3. Intubated 9/8-10.  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 She had to be reintubated on 01/15/22 and extubated 01/18/22.      SLP Plan  Continue with current plan of care      Recommendations for follow up therapy are one component of a multi-disciplinary discharge planning process, led by the attending physician.  Recommendations may be updated based on patient status, additional functional criteria and insurance authorization.     Recommendations  Diet recommendations: Dysphagia 3 (mechanical soft);Thin liquid;Other(comment) (ok for family to bring regular foods and feed with supervision)                Plan: Continue with current plan of care           Carie Kapuscinski, Katherene Ponto  01/26/2022, 2:52 PM

## 2022-01-26 NOTE — Plan of Care (Signed)
Problem: Education: Goal: Ability to describe self-care measures that may prevent or decrease complications (Diabetes Survival Skills Education) will improve Outcome: Progressing Goal: Individualized Educational Video(s) Outcome: Progressing   Problem: Coping: Goal: Ability to adjust to condition or change in health will improve Outcome: Progressing   Problem: Fluid Volume: Goal: Ability to maintain a balanced intake and output will improve Outcome: Progressing   Problem: Health Behavior/Discharge Planning: Goal: Ability to identify and utilize available resources and services will improve Outcome: Progressing Goal: Ability to manage health-related needs will improve Outcome: Progressing   Problem: Metabolic: Goal: Ability to maintain appropriate glucose levels will improve Outcome: Progressing   Problem: Nutritional: Goal: Maintenance of adequate nutrition will improve Outcome: Progressing Goal: Progress toward achieving an optimal weight will improve Outcome: Progressing   Problem: Skin Integrity: Goal: Risk for impaired skin integrity will decrease Outcome: Progressing   Problem: Tissue Perfusion: Goal: Adequacy of tissue perfusion will improve Outcome: Progressing   Problem: Education: Goal: Understanding of CV disease, CV risk reduction, and recovery process will improve Outcome: Progressing Goal: Individualized Educational Video(s) Outcome: Progressing   Problem: Activity: Goal: Ability to return to baseline activity level will improve Outcome: Progressing   Problem: Cardiovascular: Goal: Ability to achieve and maintain adequate cardiovascular perfusion will improve Outcome: Progressing Goal: Vascular access site(s) Level 0-1 will be maintained Outcome: Progressing   Problem: Health Behavior/Discharge Planning: Goal: Ability to safely manage health-related needs after discharge will improve Outcome: Progressing   Problem: Activity: Goal: Ability to  tolerate increased activity will improve Outcome: Progressing   Problem: Respiratory: Goal: Ability to maintain a clear airway and adequate ventilation will improve Outcome: Progressing   Problem: Role Relationship: Goal: Method of communication will improve Outcome: Progressing   Problem: Education: Goal: Knowledge of General Education information will improve Description: Including pain rating scale, medication(s)/side effects and non-pharmacologic comfort measures Outcome: Progressing   Problem: Health Behavior/Discharge Planning: Goal: Ability to manage health-related needs will improve Outcome: Progressing   Problem: Clinical Measurements: Goal: Ability to maintain clinical measurements within normal limits will improve Outcome: Progressing Goal: Will remain free from infection Outcome: Progressing Goal: Diagnostic test results will improve Outcome: Progressing Goal: Respiratory complications will improve Outcome: Progressing Goal: Cardiovascular complication will be avoided Outcome: Progressing   Problem: Activity: Goal: Risk for activity intolerance will decrease Outcome: Progressing   Problem: Nutrition: Goal: Adequate nutrition will be maintained Outcome: Progressing   Problem: Coping: Goal: Level of anxiety will decrease Outcome: Progressing   Problem: Elimination: Goal: Will not experience complications related to bowel motility Outcome: Progressing Goal: Will not experience complications related to urinary retention Outcome: Progressing   Problem: Pain Managment: Goal: General experience of comfort will improve Outcome: Progressing   Problem: Safety: Goal: Ability to remain free from injury will improve Outcome: Progressing   Problem: Skin Integrity: Goal: Risk for impaired skin integrity will decrease Outcome: Progressing   Problem: Education: Goal: Knowledge of disease or condition will improve Outcome: Progressing Goal: Knowledge of  secondary prevention will improve (SELECT ALL) Outcome: Progressing Goal: Knowledge of patient specific risk factors will improve (INDIVIDUALIZE FOR PATIENT) Outcome: Progressing Goal: Individualized Educational Video(s) Outcome: Progressing   Problem: Coping: Goal: Will verbalize positive feelings about self Outcome: Progressing Goal: Will identify appropriate support needs Outcome: Progressing   Problem: Health Behavior/Discharge Planning: Goal: Ability to manage health-related needs will improve Outcome: Progressing   Problem: Self-Care: Goal: Ability to participate in self-care as condition permits will improve Outcome: Progressing Goal: Verbalization of feelings and concerns over  difficulty with self-care will improve Outcome: Progressing Goal: Ability to communicate needs accurately will improve Outcome: Progressing   Problem: Nutrition: Goal: Risk of aspiration will decrease Outcome: Progressing Goal: Dietary intake will improve Outcome: Progressing

## 2022-01-26 NOTE — Progress Notes (Addendum)
Rounding Note    Patient Name: Lauren [name] Date of Encounter: 01/26/2022  [facility] Cardiologist: Carlyle Dolly, MD   Subjective   Patient denies chest pain, sob, cough. Daughter is at bedside   Hemoglobin down to 6.5 this AM-- blood transfusion ordered per primary   Inpatient Medications    Scheduled Meds:  acetaminophen  500 mg Per Tube TID   arformoterol  15 mcg Nebulization BID   atorvastatin  40 mg Per Tube Daily   budesonide (PULMICORT) nebulizer solution  0.5 mg Nebulization BID   Chlorhexidine Gluconate Cloth  6 each Topical Daily   docusate  100 mg Per Tube BID   free water  100 mL Per Tube Q4H   hydrALAZINE  12.5 mg Per Tube Q8H   insulin aspart  0-20 Units Subcutaneous Q4H   insulin aspart  6 Units Subcutaneous Q4H   insulin detemir  25 Units Subcutaneous BID   isosorbide mononitrate  20 mg Per Tube BID   levothyroxine  75 mcg Per Tube Q0600   melatonin  3 mg Per Tube QHS   metoprolol tartrate  37.5 mg Per Tube Q12H   nicotine  21 mg Transdermal Daily   pantoprazole  40 mg Per Tube Daily   revefenacin  175 mcg Nebulization Daily   ticagrelor  90 mg Oral BID   Or   ticagrelor  90 mg Per Tube BID   Continuous Infusions:  feeding supplement (GLUCERNA 1.5 CAL) 1,000 mL (01/25/22 1220)   PRN Meds: acetaminophen **OR** acetaminophen (TYLENOL) oral liquid 160 mg/5 mL **OR** acetaminophen, docusate, influenza vac split quadrivalent PF, ipratropium-albuterol, metoprolol tartrate, nitroGLYCERIN, mouth rinse, oxyCODONE, pneumococcal 20-valent conjugate vaccine, polyethylene glycol, senna-docusate   Vital Signs    Vitals:   01/26/22 0739 01/26/22 0741 01/26/22 0810 01/26/22 0855  BP:   122/75 124/67  Pulse:   95 95  Resp:   14 19  Temp:   98.5 F (36.9 C) 97.9 F (36.6 C)  TempSrc:   Oral Axillary  SpO2: 99% 100% 100% 100%  Weight:      Height:        Intake/Output Summary (Last 24 hours) at 01/26/2022 0902 Last data filed at  01/26/2022 1884 Gross per 24 hour  Intake --  Output 1550 ml  Net -1550 ml      01/25/2022    5:00 AM 01/23/2022    5:00 AM 01/22/2022    5:00 AM  Last 3 Weights  Weight (lbs) 184 lb 1.4 oz 184 lb 15.5 oz 184 lb 15.5 oz  Weight (kg) 83.5 kg 83.9 kg 83.9 kg      Telemetry    NSR, HR in the 90s-100s - Personally Reviewed  ECG    No new tracings since 9/23 - Personally Reviewed  Physical Exam   GEN: No acute distress. Laying in the bed with head elevated, left IJ CVL with dressing in place, NG tube in place  Neck: No JVD Cardiac: RRR, no murmurs, rubs, or gallops  Respiratory: Clear to auscultation bilaterally. Normal wob on room air  GI: Soft, nontender, non-distended  MS: No edema in BLE. Left foot is wrapped in bandages Neuro:  Mild dysarthria, left sided hemiplegia  Psych: Normal affect, depressed mood   Labs    High Sensitivity Troponin:   Recent Labs  Lab 01/12/22 0203 01/13/22 1059 01/23/22 1500 01/23/22 2250 01/24/22 0050  TROPONINIHS 3,512* 2,880* 127* 132* 133*     Chemistry Recent Labs  Lab 01/24/22 1660  01/25/22 0550 01/26/22 0555  NA 139 134* 136  K 4.5 4.3 4.0  CL 102 99 102  CO2 25 25 28   GLUCOSE 135* 290* 119*  BUN 91* 77* 64*  CREATININE 2.69* 2.34* 2.05*  CALCIUM 8.7* 8.4* 8.4*  MG 3.2* 3.0* 2.8*  PROT 6.0* 6.1* 5.8*  ALBUMIN 2.3* 2.4* 2.3*  AST 37 39 31  ALT 31 33 29  ALKPHOS 93 110 98  BILITOT 0.6 0.5 0.7  GFRNONAA 22* 26* 31*  ANIONGAP 12 10 6     Lipids No results for input(s): "CHOL", "TRIG", "HDL", "LABVLDL", "LDLCALC", "CHOLHDL" in the last 168 hours.  Hematology Recent Labs  Lab 01/24/22 0336 01/25/22 0550 01/26/22 0555  WBC 18.9* 19.0* 12.0*  RBC 2.71* 2.61* 2.42*  HGB 7.4* 7.0* 6.5*  HCT 22.7* 22.0* 20.4*  MCV 83.8 84.3 84.3  MCH 27.3 26.8 26.9  MCHC 32.6 31.8 31.9  RDW 24.2* 22.2* 20.3*  PLT 734* 736* 648*   Thyroid No results for input(s): "TSH", "FREET4" in the last 168 hours.  BNPNo results for input(s):  "BNP", "PROBNP" in the last 168 hours.  DDimer No results for input(s): "DDIMER" in the last 168 hours.   Radiology    DG CHEST PORT 1 VIEW  Result Date: 01/24/2022 CLINICAL DATA:  Code stroke.  Hypoxia. EXAM: PORTABLE CHEST 1 VIEW COMPARISON:  January 19, 2022 FINDINGS: Feeding catheter tip collimated off the image. High density material within the stomach. Left IJ approach central venous catheter tip at the expected location of the cavoatrial junction. Cardiomediastinal silhouette is normal. Mediastinal contours appear intact. There is no evidence of focal airspace consolidation, pleural effusion or pneumothorax. Osseous structures are without acute abnormality. Soft tissues are grossly normal. IMPRESSION: No active disease. Electronically Signed   By: Fidela Salisbury M.D.   On: 01/24/2022 19:48    Cardiac Studies   Echocardiogram 01/10/2022  1. There is akinesis of the mid-to-apical septal LV segments, all apical  segments, and apex. There is significant thinning of the mid-to-apical  septal segments and apex likely represents scarring/infarct. The  basal-to-mid inferior and basal septal  segments are hypokinetic.   2. No LV thrombus visualized on definity imaging.   3. Left ventricular ejection fraction, by estimation, is 30 to 35%. The  left ventricle has moderately decreased function. The left ventricle  demonstrates regional wall motion abnormalities (see scoring  diagram/findings for description). The left  ventricular internal cavity size was mildly-to-moderately dilated. There  is mild concentric left ventricular hypertrophy. Indeterminate diastolic  filling due to E-A fusion.   4. Right ventricular systolic function is normal. The right ventricular  size is normal.   5. The mitral valve is normal in structure. Trivial mitral valve  regurgitation.   6. The aortic valve is tricuspid. Aortic valve regurgitation is not  visualized. No aortic stenosis is present.   7. The  inferior vena cava is dilated in size with >50% respiratory  variability, suggesting right atrial pressure of 8 mmHg.   8. Agitated saline contrast bubble study was negative, with no evidence  of any interatrial shunt.   Comparison(s): Compared to prior TTE on 10/2021, the EF has dropped to  30-35% with similar wall motion.   Patient Profile     41 y.o. 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 She went immediately to IR for thrombectomy.  Cardiology was consulted on 01/11/2022 for further evaluation of EKG changes and elevated troponin.  Assessment & Plan    NSTEMI  CAD - Patient has a history of CAD with moderate to severe disease on cardiac catheterization in 2015 (treated medically). She presented this admission with an acute stroke and was found to have elevated troponin. Hs troponin peaked at 3,869. Echo this admission shows LVEF of 30-30% with multiple wall motion abnormalities. - Patient denies chest pain  - Trop elevation is likely due to type 2 demand ischemia. No plans for inpatient ischemic workup given stroke and severe anemia (hemoglobin 6.5 today). Continue manage CAD with medical therapy  - Wall motion abnormalities consistent with LAD distribution vs  stress-induced cardiomyopathy. Can consider ischemic evaluation as an outpatient once patient has recovered from acute stroke, although will need to follow patient's clinical course and goals of care  - Palliative care has been involved this admission, patient is DNR. Planning for CIR  - Continue Brilinta 90mg  twice  daily, Toprol-XL 75mg  daily, Imdur 20mg  twice daily and Lipitor 40mg  daily. - Patient was receiving lovenox injections earlier this admission for treatment of upper extremity arterial occlusion. Lovenox held this AM due to drop in hemoglobin down to 6.5    Acute on Chronic Combined CHF - Initial Echo on 9/10 showed LVEF of 30-35% with akinesis of the mid to apical septal LV segments, all apical segments, and apex as well as significant thinning of the mid to apical septal segments and apex likely representing scarring/infarct. 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  - Euvolemic on exam  - Will hold off on PO Lasix given patient is euvolemic and recovering from AKI  - GDMT: Continue Toprol-XL 75mg  daily; Imdur 20mg  daily twice daily; NO ACEi/ARB/ARNI /MRA or SGLT2Ii given advanced CKD  - Continue to monitor daily weights, strict I/Os, and renal function.    LV Apical Aneurysm - Despite repeated studies, no LV thrombus has been identified.  - Patient will require lifelong anticoagulation given right upper extremity arterial occlusion and stroke.  - Patient has been on Brilinta and lovenox this admission, however hemoglobin dropped to 6.5 this AM and lovenox was held  - If anemia improves, patient should be Dced on Brilinta and Lovenox given recurrent stroke on Eliquis and Plavix.   Hypertension - BP mostly well controlled.  - on Toprol XL 75mg , Hydralazine 12.5mg  three times daily and Imdur 20mg  twice daily.   Hyperlipidemia - Lipid panel this admission: Total Cholesterol 85, Triglycerides 154, HDL 15, LDL 39. - on Lipitor 40mg  daily.   Acute on CKD stage IV - Baseline creatinine around 2.0 to 2.5.  Creatinine 2.15 on admission but peaked at 4.54 on 9/10. Recovering now Cr 2.05 today, diuresis held/not needed at this time      Right Brachial Artery Occlusion S/p thrombectomy in 10/2021. - On Eliquis prior to admission, transitioned to Therapeutic Lovenox - Lovenox held  this AM due to hemoglobin 6.5    Acute Stroke  Type 1 DM  Osteomyelitis of Left Foot Acute on Chronic Normocytic Anemia - Management per primary team.       Outpatient follow-up has been arranged on 02/19/22      For questions or updates, please  contact [facility] Please consult [email or web address] for contact info under        Signed, Jenkins Rouge, MD  01/26/2022, 9:02 AM    Cardiac status stable Getting transfusion for Hb 6.5 lovenox held on Brillinta post stroke and left brachial thrombectomy Osteo of left foot euvolemic with feeding tube and continued aspiration risk   Jenkins Rouge MD [facility]

## 2022-01-26 NOTE — Inpatient Diabetes Management (Signed)
Inpatient Diabetes Program Recommendations  AACE/ADA: New Consensus Statement on Inpatient Glycemic Control (2015)  Target Ranges:  Prepandial:   less than 140 mg/dL      Peak postprandial:   less than 180 mg/dL (1-2 hours)      Critically ill patients:  140 - 180 mg/dL   Lab Results  Component Value Date   GLUCAP 99 01/26/2022   HGBA1C 8.0 (H) 01/09/2022    Review of Glycemic Control  Latest Reference Range & Units 01/24/22 19:26 01/24/22 20:27 01/24/22 21:05 01/24/22 21:40 01/25/22 00:31 01/25/22 03:47 01/25/22 08:42  Glucose-Capillary 70 - 99 mg/dL 69 (L) 76 72 103 (H) 201 (H) 281 (H) 272 (H)  (L): Data is abnormally low (H): Data is abnormally high  Latest Reference Range & Units 01/25/22 11:59 01/25/22 16:25 01/25/22 20:19 01/26/22 00:10 01/26/22 03:52 01/26/22 08:09  Glucose-Capillary 70 - 99 mg/dL 232 (H) 150 (H) 109 (H) 129 (H) 139 (H) 99  (H): Data is abnormally high  Diabetes history: DM1(does not make insulin.  Needs correction, basal and meal coverage)  Outpatient Diabetes medications:  Omnipod insulin pump with Dexcom Awaiting call back from endocrinology for pump settings  Current orders for Inpatient glycemic control:  Levemir 25 units BID 25 units BID Novolog 0-20 units Q4H Novolog 6 units Q4H Glucerna @ 60 ml/hr  Referral received for labile glucose.   Patient had a mild low on 9/24 @ 19:46 (69 mg/dL).  Basal insulin was held that evening which resulted in hyperglycemia the next morning (272 mg/dL).    Glucose trends now improved.  Might consider Novolog 0-15 units Q4H to avoid overcorrection.    Awaiting call back from [facility] Endocrinology for insulin pump settings.    Will continue to follow while inpatient.  Thank you, Lauren Dixon, MSN, [facility] Diabetes Coordinator Inpatient Diabetes Program [phone] (team pager from 8a-5p)

## 2022-01-26 NOTE — Progress Notes (Signed)
Daily Progress Note   Patient Name: Lauren [name]       Date: 01/26/2022 DOB: 16-Mar-1981  Age: 41 y.o. MRN#: [id number] Attending Physician: Darliss Cheney, MD Primary Care Physician: Lemmie Evens, MD Admit Date: 01/08/2022  Reason for Consultation/Follow-up: Establishing goals of care  Subjective: Medical records reviewed including progress notes, labs, imaging. Patient assessed at the bedside. Her family is present visiting.  Patient is in better spirits today and joking with her family.  Family reports there has been no further concerns with the care received on the unit.  Plan remains discharge to CIR after medical stability.  Aarohi does not have further questions about the MOST form or advance directives but is agreeable to continue discussing today. An extensive conversation was had, covering concepts specific to code status, artifical feeding and hydration, continued IV antibiotics and rehospitalization.  Reviewed the role of HCPOA as well as a living will.  Patient wants to name her husband and is unsure about her living will preferences.  She is open to meeting with the chaplain to complete documentation.  Ival Bible continues to state that she does not want a permanent PEG tube.  Family is hopeful for improved nutrition with increased oral intake and are focusing on bringing her favorite foods to encourage this.  She ate a burger and a cheesecake last night.  Her sister is understandably emotional, sharing that while patient is at peace with her mortality if she is unable to improve her nutrition that she is not there yet herself.  Emotional support provided.  Patient prefers to wait on MOST form completion for now.  Outpatient palliative care was explained and offered and she is  agreeable.  Questions and concerns addressed. PMT will continue to support holistically.   Length of Stay: 18   Physical Exam Vitals and nursing note reviewed.  Constitutional:      General: She is not in acute distress.    Appearance: She is ill-appearing.     Interventions: Nasal cannula in place.     Comments: 2 L  Cardiovascular:     Rate and Rhythm: Normal rate.  Pulmonary:     Effort: Pulmonary effort is normal. No respiratory distress.  Skin:    General: Skin is warm and dry.  Neurological:     Mental Status: She is alert.  Psychiatric:        Speech: Speech is delayed.        Behavior: Behavior is cooperative.           Vital Signs: BP 129/75   Pulse 95   Temp 98 F (36.7 C) (Axillary)   Resp (!) 21   Ht 5\' 5"  (1.651 m)   Wt 83.5 kg   LMP 11/01/2021 Comment: Neg Preg Test an Admission to Hospital.  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  She was initially admitted under PCCM service, extubated on 01/18/2022 and transferred to [facility] on 01/21/2022.  Assessment: Goals of care conversation Acute ischemic stroke AKI on CKD 4, stable Acute respiratory failure, resolved Dysphagia Acute on chronic anemia Left foot osteomyelitis DM 1 Chronic diastolic and systolic heart failure  Recommendations/Plan: Continue DNR Continue current care, patient and family remain hopeful for improvement and will continue bringing in favorite foods to encourage increased oral  intake.  She does not want a PEG if she cannot achieve this goal and understands this may lead to transition to hospice as an alternative Psychosocial and emotional support provided PMT will continue to follow and support  Prognosis:  Unable to determine  Discharge Planning: [place] was discussed with patient, patient's family   MDM high         [place], PA-C  Palliative Medicine Team Team phone # [phone]  Thank you for allowing the Palliative Medicine Team to assist in the care of this patient. Please utilize secure chat with additional questions, if there is no response within 30 minutes please call the above phone number.  Palliative Medicine Team providers are available by phone from 7am to 7pm daily and can be reached through the team cell phone.  Should this patient require assistance outside of these hours, please call the patient's attending physician.

## 2022-01-26 NOTE — Progress Notes (Signed)
PROGRESS NOTE    Lauren [name]  OHY:[id number] DOB: 07/10/1980 DOA: 01/08/2022 PCP: Lemmie Evens, MD   Brief Narrative:  41 yr old female with a history of CAD, chronic diastolic heart failure, diabetes mellitus type 2 with peripheral neuropathy with recent arterial thrombectomy to the right upper extremity due to limb ischemia, CKD stage IIIa, peripheral arterial disease was admitted with right MCA CVA and V3 segment of the right vertebral artery stenosis with left ICA stenosis.  She was initially admitted under PCCM service, extubated on 01/18/2022 and transferred to [facility] on 01/21/2022.   Significant Events: 9/8 Admitted with left sided weakness, facial droop.  NIHSS 8, to IR  for thrombectomy, intubated.  MRSA PCR +, Rhinovirus + 9/10 PSV wean, moving RUE/RLE, extubated  9/11 ECHO>, CT head negative for acute changes, Trach asp negative,  9/13 started on HFNC 10L overnight 9/14 tmax 101.2, remains on HFNC, MRI, Heme/onc and ID consult 9/18 extubated, infectious disease change antimicrobial coverage to cefadroxil and Flagyl 9/19: Required Precedex overnight for restlessness.  Off by a.m. rounds   Significant studies: CT head Periventricular white matter hypodensities adjacent to the frontal horn of the right lateral ventricles and in the right corona radiata are new since prior exam. This may represent acute ischemia. CTA head & neck - Age indeterminate occlusion of the proximal right ICA in the neck with non opacification distally in the upper neck and intracranially. Right MCA and A1 ACA are patent, but diminutive. Severe left paraclinoid ICA stenosis. CT perfusion, no evidence of core infarct or penumbra; however, there is approximately 38 mL of T-max greater than 4 seconds in the right MCA territory which suggests possible oligemia. IR with TICI3 of intracranial R ICA, however complicated by ICA dissection status post telescope stents Post IR CT - Redemonstrated hypodensity in the  right frontal WM MRI x 2 Extensive patchy areas of acute infarction involving the right MCA territory, most confluent areas along the watershed. 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)   Hypothyroidism   CAD (coronary artery disease)   Acute renal failure superimposed on stage 4 chronic kidney disease (HCC)   Tobacco abuse   Class 2 obesity due to excess calories without serious comorbidity with body mass index (BMI) of 39.0 to 39.9 in adult   Acute osteomyelitis of left foot ([place])   Internal carotid artery occlusion, right   Acute respiratory failure with hypoxia (HCC)   HFrEF (heart failure with reduced ejection fraction) (HCC)   Elevated troponin  Acute ischemic stroke of the right MCA and punctuated left MCA/ACA infarct with right ICA occlusion, s/p repair with TICI, 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  -Etiology unclear likely large vessel disease, cannot rule out cardioembolic source -Initially admitted to ICU under PCCM service, had to be intubated -2D echo showed EF of 30% with LDL 39, A1c 8.0 -Hypercoagulable work-up showed abnormal lupus anticoagulant but was also on heparin, slightly increased rheumatoid factor -Plan to repeat labs in 3 months and follow-up hematology as outpatient; hematology has been notified -She was previously on Eliquis and now switched to Lovenox per oncology recommendation, she should be on Lovenox therapeutically.  Patient is also on [place] to go to inpatient rehab.  Dysphagia: Patient unable  to safely swallow.  She is on tube feedings.  SLP following.  Patient chose to be DNR.  She is on dysphagia diet to but also getting tube feedings.  Has not been seen by SLP for few  days, will reconsult them.  She has clearly declined PEG tube placement even if needed.  Her sister at the bedside also concurs.  We we will wait for palliative care to have further discussions with the patient.   History of limb ischemia/hypercoagulability - recent h/o  right upper extremity ischemia -S/p thrombectomy -Patient was on Eliquis PTA -Switch to Lovenox per oncology recommendation.  -She will need hematology follow-up as outpatient   Acute respiratory failure with hypoxia/COPD exacerbation/rhinovirus -Resolved -She was initially intubated and admitted under PCCM service, now extubated -She was started on IV Lasix which has been discontinued -Chest x-ray showed right infrahilar opacity which has improved, procalcitonin high at 1.18 -She is currently not requiring oxygen, O2 sats 98% on room air -Continue Brovana, Pulmicort   Acute kidney injury on CKD stage IV -Early contrast-induced nephropathy, creatinine peaked at 5 -Creatinine is down to 2.6 and has remained stable since 01/22/2022. -Lasix is currently on hold -Follow BMP in am   Chronic diastolic and systolic heart failure:  -2D echo showed EF of 01%, grade 2 diastolic dysfunction;With wall motion abnormality to the LAD distribution question obstruction versus stress-induced cardiomyopathy. -Euvolemic -Cardiology was consulted and relates this may be from demand ischemia from CHF, she will need ischemic evaluation as outpatient -She was started on Imdur, metoprolol, statin; continue on Brilinta -No ACE inhibitor/ARB due to renal dysfunction  Acute on chronic anemia: Hemoglobin dropped to 6.9 on 01/23/2022, received 1 unit of PRBC transfusion.  Hemoglobin over 7.4 on 01/24/2022 but drifting down and 6.5 today.  Will transfuse 1 unit.  Hold Lovenox for today.  Repeat H&H posttransfusion and tomorrow morning.   Hypertension -Patient is currently on metoprolol, Imdur -Blood pressure well controlled   Diabetes mellitus  type 1/DKA on admission -DKA has resolved.  Continue Semglee 25 units twice daily and SSI.  Blood sugar controlled.   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 Recommended to continue cefadroxil and Flagyl, andstop antibiotics if culture is negative. Abx have been stopped. -will require ID follow-up as an outpatient. ID was consulted ; culture from bone biopsy performed on 01/16/2022 negative till date.   Leukocytosis Discussed with ID regarding persistent leukocytosis -Does not seem to be infectious She was on steroids till 9/16, may be reactive.  Follow CBC.   Class 2 obesity due to excess calories without serious comorbidity with body mass index (BMI) of 39.0 to 39.9  in adult: Noted.   Tobacco abuse: Current smoker she has been counseled, nicotine patch was placed.  DVT prophylaxis: SCDs Start: 01/08/22 1926   Code Status: DNR  Family Communication: None present at bedside.   Status is: Inpatient Remains inpatient appropriate because: Pending placement to CIR.   Estimated body mass index is 30.63 kg/m as calculated from the following:   Height as of this encounter: 5\' 5"  (1.651 m).   Weight as of this encounter: 83.5 kg.    Nutritional Assessment: Body mass index is 30.63 kg/m.Marland Kitchen Seen by dietician.  I agree with the assessment and plan as  outlined below: Nutrition Status: Nutrition Problem: Inadequate oral intake Etiology: inability to eat Signs/Symptoms: NPO status Interventions: Tube feeding, Prostat  . Skin Assessment: I have examined the patient's skin and I agree with the wound assessment as performed by the wound care RN as outlined below:    Consultants:   Hematology Infectious disease Podiatry Nephrology Cardiology  Procedures:  As above  Antimicrobials:  Anti-infectives (From admission, onward)    Start     Dose/Rate Route Frequency Ordered Stop   01/18/22 1330  cefadroxil (DURICEF) capsule 500 mg  Status:  Discontinued        500 mg Per Tube 2 times daily 01/18/22 1231 01/21/22 0759   01/18/22 1330  metroNIDAZOLE (FLAGYL) tablet 500 mg  Status:  Discontinued        500 mg Per Tube Every 12 hours 01/18/22 1231 01/21/22 0759   01/16/22 1700  ciprofloxacin (CIPRO) IVPB 400 mg  Status:  Discontinued        400 mg 200 mL/hr over 60 Minutes Intravenous Every 24 hours 01/16/22 1015 01/18/22 1231   01/16/22 1200  vancomycin (VANCOREADY) IVPB 500 mg/100 mL  Status:  Discontinued        500 mg 100 mL/hr over 60 Minutes Intravenous Every 24 hours 01/16/22 1018 01/18/22 1231   01/16/22 0400  ciprofloxacin (CIPRO) IVPB 200 mg  Status:  Discontinued        200 mg 100 mL/hr over 60 Minutes Intravenous Every 12 hours 01/15/22 1244 01/16/22 1015   01/15/22 1330  vancomycin (VANCOREADY) IVPB 1750 mg/350 mL        1,750 mg 175 mL/hr over 120 Minutes Intravenous  Once 01/15/22 1240 01/15/22 1600   01/15/22 1330  ciprofloxacin (CIPRO) IVPB 200 mg        200 mg 100 mL/hr over 60 Minutes Intravenous  Once 01/15/22 1244 01/15/22 1722   01/15/22 1249  vancomycin variable dose per unstable renal function (pharmacist dosing)  Status:  Discontinued         Does not apply See admin instructions 01/15/22 1249 01/16/22 1019   01/09/22 2245  amoxicillin-clavulanate (AUGMENTIN) 500-125 MG per tablet 500 mg  Status:  Discontinued        1 tablet Per Tube Every 12 hours 01/09/22 2155 01/15/22 0912   01/09/22 2200  amoxicillin-clavulanate (AUGMENTIN) 875-125 MG per tablet 1 tablet  Status:  Discontinued        1 tablet Per Tube Every 12 hours 01/09/22 1055 01/09/22 1630   01/09/22 2200  amoxicillin-clavulanate (AUGMENTIN) 500-125 MG per tablet 500 mg  Status:   Discontinued        1 tablet Oral Every 12 hours 01/09/22 1630 01/09/22 2155   01/09/22 1030  amoxicillin-clavulanate (AUGMENTIN) 875-125 MG per tablet 1 tablet  Status:  Discontinued        1 tablet Oral Every 12 hours 01/09/22 0935 01/09/22 1055   01/08/22 1425  ceFAZolin (ANCEF) 2-4 GM/100ML-% IVPB       Note to Pharmacy: Wandalee Ferdinand J: cabinet override      01/08/22 1425 01/08/22 2035         Subjective:  Patient examined.  Sister at the bedside.  Patient has no complaints.  She does not want PEG tube if needed.  She understands that if she will not have the nutrition, she will eventually pass away.  Objective: Vitals:   01/26/22 0352 01/26/22 0739 01/26/22 0741 01/26/22 0810  BP: 111/81   122/75  Pulse: 89   95  Resp: 11   14  Temp: 97.7 F (36.5 C)   98.5 F (36.9 C)  TempSrc: Axillary   Oral  SpO2: 100% 99% 100% 100%  Weight:      Height:        Intake/Output Summary (Last 24 hours) at 01/26/2022 0831 Last data filed at 01/26/2022 9767 Gross per 24 hour  Intake --  Output 1550 ml  Net -1550 ml    Filed Weights   01/22/22 0500 01/23/22 0500 01/25/22 0500  Weight: 83.9 kg 83.9 kg 83.5 kg    Examination:  General exam: Appears calm and comfortable  Respiratory system: Clear to auscultation. Respiratory effort normal. Cardiovascular system: S1 & S2 heard, RRR. No JVD, murmurs, rubs, gallops or clicks. No pedal edema. Gastrointestinal system: Abdomen is nondistended, soft and nontender. No organomegaly or masses felt. Normal bowel sounds heard. Central nervous system: Alert and oriented.  Left hemiparesis, left facial droop, dysarthria. Skin: No rashes, lesions or ulcers.  Psychiatry: Judgement and insight appear normal. Mood & affect appropriate.    Data Reviewed: I have personally reviewed following labs and imaging studies  CBC: Recent Labs  Lab 01/23/22 0640 01/23/22 0704 01/24/22 0336 01/25/22 0550 01/26/22 0555  WBC 21.1* 21.2* 18.9* 19.0*  12.0*  NEUTROABS  --  15.8* 13.2* 17.7* 8.3*  HGB 6.9* 7.0* 7.4* 7.0* 6.5*  HCT 21.5* 21.7* 22.7* 22.0* 20.4*  MCV 91.5 90.0 83.8 84.3 84.3  PLT 788* 801* 734* 736* 648*    Basic Metabolic Panel: Recent Labs  Lab 01/23/22 0640 01/23/22 0704 01/23/22 1500 01/24/22 0336 01/25/22 0550 01/26/22 0555  NA 138  --  140 139 134* 136  K 4.6  --  4.7 4.5 4.3 4.0  CL 98  --  100 102 99 102  CO2 26  --  27 25 25 28   GLUCOSE 135*  --  165* 135* 290* 119*  BUN 93*  --  92* 91* 77* 64*  CREATININE 2.71*  --  2.71* 2.69* 2.34* 2.05*  CALCIUM 8.9  --  8.9 8.7* 8.4* 8.4*  MG  --  3.1* 3.2* 3.2* 3.0* 2.8*  PHOS  --  6.9* 6.9* 7.3* 6.5* 5.9*    GFR: Estimated Creatinine Clearance: 39.3 mL/min (A) (by C-G formula based on SCr of 2.05 mg/dL (H)). Liver Function Tests: Recent Labs  Lab 01/23/22 0704 01/24/22 0336 01/25/22 0550 01/26/22 0555  AST 33 37 39 31  ALT 28 31 33 29  ALKPHOS 94 93 110 98  BILITOT 0.3 0.6 0.5 0.7  PROT 6.3* 6.0* 6.1* 5.8*  ALBUMIN 2.3* 2.3* 2.4* 2.3*    No results for input(s): "LIPASE", "AMYLASE" in the last 168 hours. No results for input(s): "AMMONIA" in the last 168 hours. Coagulation Profile: No results for input(s): "INR", "PROTIME" in the last 168 hours. Cardiac Enzymes: No results for input(s): "CKTOTAL", "CKMB", "CKMBINDEX", "TROPONINI" in the last 168 hours. BNP (last 3 results) No results for input(s): "PROBNP" in the last 8760 hours. HbA1C: No results for input(s): "HGBA1C" in the last 72 hours. CBG: Recent Labs  Lab 01/25/22 1625 01/25/22 2019 01/26/22 0010 01/26/22 0352 01/26/22 0809  GLUCAP 150* 109* 129* 139* 99    Lipid Profile: No results for input(s): "CHOL", "HDL", "LDLCALC", "TRIG", "CHOLHDL", "LDLDIRECT" in the last 72 hours. Thyroid Function Tests: No results for input(s): "TSH", "T4TOTAL", "FREET4", "T3FREE", "THYROIDAB" in the last 72 hours. Anemia Panel: No results for input(s): "VITAMINB12", "FOLATE", "FERRITIN",  "TIBC", "IRON", "RETICCTPCT" in the last 72 hours.  Sepsis Labs: Recent Labs  Lab 01/21/22 0830  PROCALCITON 1.18     Recent Results (from the past 240 hour(s))  Aerobic/Anaerobic Culture w Gram Stain (surgical/deep wound)     Status: None   Collection Time: 01/16/22 11:48 AM   Specimen: Bone; Tissue  Result Value Ref Range Status   Specimen Description BONE  Final   Special Requests 4TH METATARSAL LEFT FOOT  Final   Gram Stain NO WBC SEEN NO ORGANISMS SEEN   Final   Culture   Final    No growth aerobically or anaerobically. Performed at [facility], [place] [address] [place], [place] [address]    Report Status 01/21/2022 FINAL  Final     Radiology Studies: DG CHEST PORT 1 VIEW  Result Date: 01/24/2022 CLINICAL DATA:  Code stroke.  Hypoxia. EXAM: PORTABLE CHEST 1 VIEW COMPARISON:  January 19, 2022 FINDINGS: Feeding catheter tip collimated off the image. High density material within the stomach. Left IJ approach central venous catheter tip at the expected location of the cavoatrial junction. Cardiomediastinal silhouette is normal. Mediastinal contours appear intact. There is no evidence of focal airspace consolidation, pleural effusion or pneumothorax. Osseous structures are without acute abnormality. Soft tissues are grossly normal. IMPRESSION: No active disease. Electronically Signed   By: Fidela Salisbury M.D.   On: 01/24/2022 19:48    Scheduled Meds:  acetaminophen  500 mg Per Tube TID   arformoterol  15 mcg Nebulization BID   atorvastatin  40 mg Per Tube Daily   budesonide (PULMICORT) nebulizer solution  0.5 mg Nebulization BID   Chlorhexidine Gluconate Cloth  6 each Topical Daily   docusate  100 mg Per Tube BID   free water  100 mL Per Tube Q4H   hydrALAZINE  12.5 mg Per Tube Q8H   insulin aspart  0-20 Units Subcutaneous Q4H   insulin aspart  6 Units Subcutaneous Q4H   insulin detemir  25 Units Subcutaneous BID   isosorbide mononitrate  20 mg Per Tube BID    levothyroxine  75 mcg Per Tube Q0600   melatonin  3 mg Per Tube QHS   metoprolol tartrate  37.5 mg Per Tube Q12H   nicotine  21 mg Transdermal Daily   pantoprazole  40 mg Per Tube Daily   revefenacin  175 mcg Nebulization Daily   ticagrelor  90 mg Oral BID   Or   ticagrelor  90 mg Per Tube BID   Continuous Infusions:  feeding supplement (GLUCERNA 1.5 CAL) 1,000 mL (01/25/22 1220)     LOS: 18 days   Darliss Cheney, MD [facility]  01/26/2022, 8:31 AM   *Please note that this is a verbal dictation therefore any spelling or grammatical errors are due to the "[place]" system interpretation.  Please page via [place] and do not message via secure chat for urgent patient care matters. Secure chat can be used for non urgent patient care matters.  How to contact the [facility] Attending or Consulting provider [place] or covering provider during after hours [place], for this patient?  Check the care team in [facility] and look for a) attending/consulting [facility] provider listed and b) the [facility] team listed. Page or secure chat [phone]. Log into [email or web address] and use [place]'s universal password to access. If you do not have the password, please contact the hospital operator. Locate the [facility] provider you are looking for under [facility] and page to a number that you can be directly reached.  If you still have difficulty reaching the provider, please page the [facility] (Director on Call) for the Hospitalists listed on amion for assistance.

## 2022-01-27 LAB — CBC WITH DIFFERENTIAL/PLATELET
Abs Immature Granulocytes: 0.15 10*3/uL — ABNORMAL HIGH (ref 0.00–0.07)
Basophils Absolute: 0.1 10*3/uL (ref 0.0–0.1)
Basophils Relative: 0 %
Eosinophils Absolute: 0.4 10*3/uL (ref 0.0–0.5)
Eosinophils Relative: 3 %
HCT: 24.9 % — ABNORMAL LOW (ref 36.0–46.0)
Hemoglobin: 8.1 g/dL — ABNORMAL LOW (ref 12.0–15.0)
Immature Granulocytes: 1 %
Lymphocytes Relative: 16 %
Lymphs Abs: 1.8 10*3/uL (ref 0.7–4.0)
MCH: 27.8 pg (ref 26.0–34.0)
MCHC: 32.5 g/dL (ref 30.0–36.0)
MCV: 85.6 fL (ref 80.0–100.0)
Monocytes Absolute: 0.8 10*3/uL (ref 0.1–1.0)
Monocytes Relative: 7 %
Neutro Abs: 8.1 10*3/uL — ABNORMAL HIGH (ref 1.7–7.7)
Neutrophils Relative %: 73 %
Platelets: 671 10*3/uL — ABNORMAL HIGH (ref 150–400)
RBC: 2.91 MIL/uL — ABNORMAL LOW (ref 3.87–5.11)
RDW: 18.4 % — ABNORMAL HIGH (ref 11.5–15.5)
WBC: 11.2 10*3/uL — ABNORMAL HIGH (ref 4.0–10.5)
nRBC: 0 % (ref 0.0–0.2)

## 2022-01-27 LAB — GLUCOSE, CAPILLARY
Glucose-Capillary: 136 mg/dL — ABNORMAL HIGH (ref 70–99)
Glucose-Capillary: 142 mg/dL — ABNORMAL HIGH (ref 70–99)
Glucose-Capillary: 151 mg/dL — ABNORMAL HIGH (ref 70–99)
Glucose-Capillary: 180 mg/dL — ABNORMAL HIGH (ref 70–99)
Glucose-Capillary: 199 mg/dL — ABNORMAL HIGH (ref 70–99)
Glucose-Capillary: 203 mg/dL — ABNORMAL HIGH (ref 70–99)
Glucose-Capillary: 237 mg/dL — ABNORMAL HIGH (ref 70–99)

## 2022-01-27 LAB — COMPREHENSIVE METABOLIC PANEL
ALT: 29 U/L (ref 0–44)
AST: 29 U/L (ref 15–41)
Albumin: 2.3 g/dL — ABNORMAL LOW (ref 3.5–5.0)
Alkaline Phosphatase: 98 U/L (ref 38–126)
Anion gap: 10 (ref 5–15)
BUN: 53 mg/dL — ABNORMAL HIGH (ref 6–20)
CO2: 28 mmol/L (ref 22–32)
Calcium: 8.8 mg/dL — ABNORMAL LOW (ref 8.9–10.3)
Chloride: 101 mmol/L (ref 98–111)
Creatinine, Ser: 1.87 mg/dL — ABNORMAL HIGH (ref 0.44–1.00)
GFR, Estimated: 35 mL/min — ABNORMAL LOW (ref >60)
Glucose, Bld: 134 mg/dL — ABNORMAL HIGH (ref 70–99)
Potassium: 3.9 mmol/L (ref 3.5–5.1)
Sodium: 139 mmol/L (ref 135–145)
Total Bilirubin: 0.5 mg/dL (ref 0.3–1.2)
Total Protein: 5.8 g/dL — ABNORMAL LOW (ref 6.5–8.1)

## 2022-01-27 LAB — BPAM RBC
Blood Product Expiration Date: 202310062359
Blood Product Expiration Date: 202310112359
ISSUE DATE / TIME: 202309231518
ISSUE DATE / TIME: 202309260828
Unit Type and Rh: 5100
Unit Type and Rh: 6200

## 2022-01-27 LAB — TYPE AND SCREEN
ABO/RH(D): A POS
Antibody Screen: POSITIVE
Donor AG Type: NEGATIVE
Donor AG Type: NEGATIVE
Unit division: 0
Unit division: 0

## 2022-01-27 LAB — PHOSPHORUS: Phosphorus: 5.6 mg/dL — ABNORMAL HIGH (ref 2.5–4.6)

## 2022-01-27 LAB — MAGNESIUM: Magnesium: 2.7 mg/dL — ABNORMAL HIGH (ref 1.7–2.4)

## 2022-01-27 MED ORDER — PANTOPRAZOLE SODIUM 40 MG PO TBEC
40.0000 mg | DELAYED_RELEASE_TABLET | Freq: Every day | ORAL | Status: DC
  Administered 2022-01-28: 40 mg via ORAL
  Filled 2022-01-27: qty 1

## 2022-01-27 MED ORDER — POLYETHYLENE GLYCOL 3350 17 G PO PACK: 17.0000 g | PACK | Freq: Every day | ORAL | Status: DC | PRN

## 2022-01-27 MED ORDER — ISOSORBIDE MONONITRATE 20 MG PO TABS
20.0000 mg | ORAL_TABLET | Freq: Two times a day (BID) | ORAL | Status: DC
  Administered 2022-01-28: 20 mg via ORAL
  Filled 2022-01-27 (×2): qty 1

## 2022-01-27 MED ORDER — ACETAMINOPHEN 500 MG PO TABS
500.0000 mg | ORAL_TABLET | Freq: Three times a day (TID) | ORAL | Status: DC
  Administered 2022-01-28: 500 mg via ORAL
  Filled 2022-01-27 (×2): qty 1

## 2022-01-27 MED ORDER — SENNOSIDES-DOCUSATE SODIUM 8.6-50 MG PO TABS: 1.0000 | ORAL_TABLET | Freq: Every evening | ORAL | Status: DC | PRN

## 2022-01-27 MED ORDER — HYDRALAZINE HCL 25 MG PO TABS
12.5000 mg | ORAL_TABLET | Freq: Three times a day (TID) | ORAL | Status: DC
  Administered 2022-01-28 (×2): 12.5 mg via ORAL
  Filled 2022-01-27 (×2): qty 1

## 2022-01-27 MED ORDER — METOPROLOL TARTRATE 25 MG PO TABS
37.5000 mg | ORAL_TABLET | Freq: Two times a day (BID) | ORAL | Status: DC
  Administered 2022-01-28: 37.5 mg via ORAL
  Filled 2022-01-27: qty 1

## 2022-01-27 MED ORDER — MELATONIN 3 MG PO TABS: 3.0000 mg | ORAL_TABLET | Freq: Every day | ORAL | Status: DC

## 2022-01-27 MED ORDER — LEVOTHYROXINE SODIUM 75 MCG PO TABS
75.0000 ug | ORAL_TABLET | Freq: Every day | ORAL | Status: DC
  Administered 2022-01-28: 75 ug via ORAL
  Filled 2022-01-27: qty 1

## 2022-01-27 MED ORDER — DOCUSATE SODIUM 100 MG PO CAPS: 100.0000 mg | ORAL_CAPSULE | Freq: Two times a day (BID) | ORAL | Status: DC | PRN

## 2022-01-27 NOTE — Plan of Care (Signed)
  Problem: Education: Goal: Ability to describe self-care measures that may prevent or decrease complications (Diabetes Survival Skills Education) will improve Outcome: Progressing   

## 2022-01-27 NOTE — Progress Notes (Signed)
This chaplain is present for Advance Directive follow up with the Pt.  The Pt. is awake after participating in a breathing treatment.  The chaplain provided Pt. education on HCPOA and Living Will to ensure all of the Pt. questions are answered. Both HCPOA and LW are filled out. The document is on the window sill. The chaplain understands the Pt. prefers her daughter-[name] at the bedside for notarizing. The chaplain understands [name] will arrive later this morning. At this time the chaplain will return and call the notary.  Chaplain Sallyanne Kuster [phone]

## 2022-01-27 NOTE — Progress Notes (Signed)
This chaplain is present with the Pt., Pt. daughter-[name], notary, and two witnesses for the notarizing of the Pt. Advance Directive:  HCPOA and Living Will.  The Pt. chose Jerelyn Scott as her healthcare agent. If this person is unable or unwilling to serve as her healthcare agent, the Pt. next choice is Janifer Adie.  The chaplain gave the Pt. the original AD along with two copies. A copy of the Pt. AD was scanned into the Pt. EMR.   This chaplain is available for F/U spiritual care as needed.  Chaplain Sallyanne Kuster [phone]

## 2022-01-27 NOTE — Inpatient Diabetes Management (Signed)
Inpatient Diabetes Program Recommendations  AACE/ADA: New Consensus Statement on Inpatient Glycemic Control (2015)  Target Ranges:  Prepandial:   less than 140 mg/dL      Peak postprandial:   less than 180 mg/dL (1-2 hours)      Critically ill patients:  140 - 180 mg/dL   Lab Results  Component Value Date   GLUCAP 151 (H) 01/27/2022   HGBA1C 8.0 (H) 01/09/2022    Review of Glycemic Control  Latest Reference Range & Units 01/26/22 08:09 01/26/22 12:15 01/26/22 16:29 01/26/22 20:30 01/27/22 00:02 01/27/22 03:46 01/27/22 08:26  Glucose-Capillary 70 - 99 mg/dL 99 155 (H) 264 (H) 273 (H) 142 (H) 136 (H) 151 (H)  (H): Data is abnormally high  Diabetes history: DM1(does not make insulin.  Needs correction, basal and meal coverage)  Outpatient Diabetes medications:  Per Whitney with [facility]-last settings on 9/10  Basal rate 12a-12p = 0.9 units/hr--total 24 hr basal 21.6 units Insulin: Carb ratio= 1 unit/10 g  Correction Sensitivity= 38 mg/dL  Target BG= 120  Correction Threshold= 120  Active time= 4 hrs  Max basal rate= 2 units/hr  Extended bolus= off  Reverse correction= On  Max bolus= 20 units   Current orders for Inpatient glycemic control:  Levemir 25 units BID, Novolog 0-20 units Q4H, Glucernia @ 65 continuous  Inpatient Diabetes Program Recommendations:    Patient is eating a dysphasia 3 diet.  Please consider:  Novolog 2 units TID with meals if consumes at least 50%.    Noted also receiving tube feeds continuous (if tube feeds are stopped please consider decreasing correction to 0-9 units TID and 0-5 QHS)  Will continue to follow while inpatient.  Thank you, Reche Dixon, MSN, [facility] Coordinator Inpatient Diabetes Program [phone] (team pager from 8a-5p)

## 2022-01-27 NOTE — Plan of Care (Signed)
  Problem: Education: Goal: Ability to describe self-care measures that may prevent or decrease complications (Diabetes Survival Skills Education) will improve Outcome: Progressing   Problem: Skin Integrity: Goal: Risk for impaired skin integrity will decrease Outcome: Progressing   Problem: Respiratory: Goal: Ability to maintain a clear airway and adequate ventilation will improve Outcome: Progressing

## 2022-01-27 NOTE — Progress Notes (Signed)
Occupational Therapy Treatment Patient Details Name: Lauren [name] MRN: [id number] DOB: 1981-04-11 Today's Date: 01/27/2022   History of present illness 41 y/o female presenting 9/8 with gait instability, L-sided facial droop, weakness, and slurred speech. 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 Hospitalization complicated by URI, Rhinovirus positive, acute COPD exacerbation, elevated troponin, possibly due to cardiac injury or Takotsubo cardiomyopathy. L 4th ray bone biopsy 01/16/22, concern for osteomyelitis. Intubated 9/15, extubated 9/18. 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   OT comments  Pt making slow improvements toward all goals. Pt perseverating today on being cold making it difficult to focus on all tasks.  Pt with L side hemiplegia, L neglect, impulsivity, poor safety/judgement/cognition affecting safety with all adls and mobility. Feel AIR is best option to rehabilitate pt with all adls and teach family how to safely care for pt.    Recommendations for follow up therapy are one component of a multi-disciplinary discharge planning process, led by the attending physician.  Recommendations may be updated based on patient status, additional functional criteria and insurance authorization.    Follow Up Recommendations  Acute inpatient rehab (3hours/day)    Assistance Recommended at Discharge Frequent or constant Supervision/Assistance  Patient can return home with the following  A lot of help with walking and/or transfers;A lot of help with bathing/dressing/bathroom;Two people to help with walking and/or transfers;Two people to help with bathing/dressing/bathroom;Assistance with cooking/housework;Assistance  with feeding;Direct supervision/assist for medications management;Direct supervision/assist for financial management;Assist for transportation;Help with stairs or ramp for entrance   Equipment Recommendations  Other (comment) (tbd)    Recommendations for Other Services      Precautions / Restrictions Precautions Precautions: Fall Precaution Comments: L inattention, R gaze preference, L hemiplegia, cortrak; L foot dressing Required Braces or Orthoses: Other Brace Other Brace: dressing on L foot Restrictions Weight Bearing Restrictions: No Other Position/Activity Restrictions: No active bleeding noted in LLE this afternoon.       Mobility Bed Mobility Overal bed mobility: Needs Assistance Bed Mobility: Supine to Sit, Sit to Supine, Rolling Rolling: Min assist Sidelying to sit: Mod assist   Sit to supine: Mod assist   General bed mobility comments: Pt impulsive when getting up to the side of the bed. Pt, despite cues to move one step at a time, attempts to get up unsupervised in a impulsive, unsafe fashion.  Talked to family at length about giving one simple command at a time to attempt to get pt to slow down and become safer with mobility.    Transfers                   General transfer comment: Pt focused on being cold today and unwilling to attempt sit to stand.  Addressed lateral scoots in bed     Balance Overall balance assessment: Needs assistance Sitting-balance support: Feet supported, Single extremity supported Sitting balance-Leahy Scale: Poor Sitting balance - Comments: Pt pulling self down to laying position to R in bed while in sitting.  Weight bearing tasks initiated with LUE but pt unable to to tend to ask at hand to teach about weight bearing at this time. Postural control: Right lateral lean, Other (comment) (wanting to lay down in bed to her right)  ADL either performed or assessed with clinical judgement    ADL Overall ADL's : Needs assistance/impaired Eating/Feeding: NPO   Grooming: Moderate assistance;Sitting Grooming Details (indicate cue type and reason): Pt requires max cues to finish the task. Pt will start task and become distracted soon after starting.                 Toilet Transfer: Maximal assistance;+2 for physical assistance;BSC/3in1 Toilet Transfer Details (indicate cue type and reason): Pt unable to fully stand today c/o pain in LLE. Pt scooted up and down bed with max assist of 1 and use of pad to prepare for transfers to [facility]. Pt unwilling to leave bed bc pt was cold. Toileting- Clothing Manipulation and Hygiene: Total assistance       Functional mobility during ADLs: Maximal assistance;+2 for physical assistance;+2 for safety/equipment General ADL Comments: bed level ROM for L hemi body, transferred to sitting with min-max A sitting balance and stood EOB x2    Extremity/Trunk Assessment Upper Extremity Assessment Upper Extremity Assessment: LUE deficits/detail LUE Deficits / Details: No active movement noted during session LUE Sensation: decreased light touch LUE Coordination: decreased fine motor;decreased gross motor   Lower Extremity Assessment Lower Extremity Assessment: Defer to PT evaluation        Vision   Vision Assessment?: Vision impaired- to be further tested in functional context;Yes Eye Alignment: Within Functional Limits Alignment/Gaze Preference: Gaze right Depth Perception: Undershoots   Perception Perception Perception: Impaired   Praxis Praxis Praxis: Not tested    Cognition Arousal/Alertness: Awake/alert Behavior During Therapy: Impulsive, Restless Overall Cognitive Status: Impaired/Different from baseline Area of Impairment: Attention, Memory, Safety/judgement, Awareness, Problem solving, Following commands                   Current Attention Level: Focused Memory: Decreased short-term memory Following Commands:  Follows one step commands consistently Safety/Judgement: Decreased awareness of safety, Decreased awareness of deficits Awareness: Emergent (states " my body is not doing as it should") Problem Solving: Slow processing, Difficulty sequencing, Requires verbal cues, Requires tactile cues General Comments: Pt significantley distracted today and perseverated on being cold and needing blankets. Pt requires constant redirection back to task and constant cues to tend to her L side. Pt with severe L neglect of body and environment.        Exercises Other Exercises Other Exercises: PROm shoulder elevation and depression. Mod-max tightness in surrounding musculature. Pt reporting pain. Tolerating ~50% PROM Other Exercises: PROM to fingers, wrist, elbow and shoulder in order to maintain ROM and prepare for active motor control.    Shoulder Instructions       General Comments Pt with significant impairments in areas of mobility, cognition, perception and strength affecting her independence with all adsl.    Pertinent Vitals/ Pain       Pain Assessment Pain Assessment: Faces Faces Pain Scale: Hurts even more Pain Location: L leg around the knee and L arm but could not localize where the pain was Pain Descriptors / Indicators: Discomfort, Grimacing Pain Intervention(s): Limited activity within patient's tolerance, Repositioned, Monitored during session  Home Living                                          Prior Functioning/Environment              Frequency  Min 2X/week  Progress Toward Goals  OT Goals(current goals can now be found in the care plan section)  Progress towards OT goals: Progressing toward goals  Acute Rehab OT Goals Patient Stated Goal: to be well OT Goal Formulation: With patient Time For Goal Achievement: 02/08/22 Potential to Achieve Goals: Good ADL Goals Pt Will Perform Grooming: with mod assist;standing Pt Will Perform Upper Body  Dressing: with min assist;sitting Pt Will Transfer to Toilet: with +2 assist;with mod assist;stand pivot transfer;bedside commode Additional ADL Goal #1: Pt will demonstrate use of compensatory techniques for protection of LUE with min cues during functional mobility and ADL. Additional ADL Goal #2: Pt will locate at least 3 grooming items in her L environment with mod cues to initiate grooming task  Plan Discharge plan remains appropriate    Co-evaluation                 AM-PAC OT "6 Clicks" Daily Activity     Outcome Measure   Help from another person eating meals?: None Help from another person taking care of personal grooming?: A Little Help from another person toileting, which includes using toliet, bedpan, or urinal?: Total Help from another person bathing (including washing, rinsing, drying)?: A Lot Help from another person to put on and taking off regular upper body clothing?: A Lot Help from another person to put on and taking off regular lower body clothing?: Total 6 Click Score: 13    End of Session    OT Visit Diagnosis: Unsteadiness on feet (R26.81);Other abnormalities of gait and mobility (R26.89);Muscle weakness (generalized) (M62.81);Hemiplegia and hemiparesis Hemiplegia - Right/Left: Left Hemiplegia - dominant/non-dominant: Dominant Hemiplegia - caused by: Cerebral infarction   Activity Tolerance Patient tolerated treatment well   Patient Left in bed;with call bell/phone within reach;with bed alarm set;with family/visitor present   Nurse Communication Mobility status        Time: 1103-1594 OT Time Calculation (min): 25 min  Charges: OT General Charges $OT Visit: 1 Visit OT Treatments $Self Care/Home Management : 8-22 mins $Therapeutic Activity: 8-22 mins   Glenford Peers 01/27/2022, 1:40 PM

## 2022-01-27 NOTE — Progress Notes (Signed)
Daily Progress Note   Patient Name: Lauren [name]       Date: 01/27/2022 DOB: Mar 27, 1981  Age: 41 y.o. MRN#: [id number] Attending Physician: Darliss Cheney, MD Primary Care Physician: Lemmie Evens, MD Admit Date: 01/08/2022  Reason for Consultation/Follow-up: Establishing goals of care  Subjective: Medical records reviewed including progress notes, labs, imaging. Patient assessed at the bedside. Her family is present visiting.  Patient would like to proceed with MOST form completion today after ADT was completed earlier in the morning.  An extensive conversation was had, covering concepts specific to code status, artifical feeding and hydration, continued IV antibiotics and rehospitalization.  We also talked about patient's desire to remove NG tube today.  Counseled at length about the possible outcomes, importance of maintaining oral intake if her goal is to prolong her life.  She understands and states that if she is unable to eat on her own, she would never want this to be replaced even temporarily and would then be ready for comfort care and hospice at that time.  Questions and concerns addressed. PMT will continue to support holistically.   Length of Stay: 19  Physical Exam Vitals and nursing note reviewed.  Constitutional:      General: She is not in acute distress.    Appearance: She is ill-appearing.     Interventions: Nasal cannula in place.     Comments: 2 L  Cardiovascular:     Rate and Rhythm: Normal rate.  Pulmonary:     Effort: Pulmonary effort is normal. No respiratory distress.  Skin:    General: Skin is warm and dry.  Neurological:     Mental Status: She is alert and oriented to person, place, and time.  Psychiatric:        Speech: Speech is delayed.         Behavior: Behavior is cooperative.           Vital Signs: BP 121/79 (BP Location: Right Arm)   Pulse 90   Temp 98.1 F (36.7 C) (Oral)   Resp 20   Ht 5\' 5"  (1.651 m)   Wt 84.6 kg   LMP 11/01/2021 Comment: Neg Preg Test an Admission to Hospital.  TRK  SpO2 100%   BMI 31.04 kg/m  SpO2: SpO2: 100 % O2 Device: O2 Device: Room Air O2  Flow Rate: O2 Flow Rate (L/min): 2 L/min      Palliative Assessment/Data: 40%   Palliative Care Assessment & Plan   Patient Profile: Per intake H&P --> 30 yr old WF PMHX CAD, Chronic Diastolic CHF, DM type II uncontrolled with peripheral neuropathy, with recent arterial thrombectomy to the right upper extremity due to limb ischemia, CKD stage IIIa, PAD. 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  She was initially admitted under PCCM service, extubated on 01/18/2022 and transferred to [facility] on 01/21/2022.  Assessment: Goals of care conversation Acute ischemic stroke AKI on CKD 4, stable Acute respiratory failure, resolved Dysphagia Acute on chronic anemia Left foot osteomyelitis DM 1 Chronic diastolic and systolic heart failure  Recommendations/Plan: Continue DNR Continue current care MOST form completed today.  Provided family with several copies and original on patient's hard chart.  Will scan copy into EMR. Patient wishes to remove NG tube today to see how she will eat on her own.  If she is unable to maintain her nutrition, she would then want to transition to comfort care and hospice Goal remains CIR placement when stable Psychosocial and emotional support provided PMT will continue to follow and support  Prognosis:  Unable to determine  Discharge Planning: CIR  Care plan was discussed with patient, patient's husband and daughter   MDM high         D[place], PA-C  Palliative Medicine Team Team phone # [phone]  Thank you for allowing the Palliative  Medicine Team to assist in the care of this patient. Please utilize secure chat with additional questions, if there is no response within 30 minutes please call the above phone number.  Palliative Medicine Team providers are available by phone from 7am to 7pm daily and can be reached through the team cell phone.  Should this patient require assistance outside of these hours, please call the patient's attending physician.

## 2022-01-27 NOTE — Progress Notes (Addendum)
Rounding Note    Patient Name: Lauren [name] Date of Encounter: 01/27/2022  [place] Cardiologist: Carlyle Dolly, MD   Subjective   Patient reports feeling the same as yesterday. Denies chest pain, palpitations, cough. Appetite has improved slightly   Inpatient Medications    Scheduled Meds:  acetaminophen  500 mg Per Tube TID   arformoterol  15 mcg Nebulization BID   atorvastatin  40 mg Per Tube Daily   budesonide (PULMICORT) nebulizer solution  0.5 mg Nebulization BID   Chlorhexidine Gluconate Cloth  6 each Topical Daily   docusate  100 mg Per Tube BID   feeding supplement (GLUCERNA 1.5 CAL)  1,000 mL Per Tube Q24H   free water  100 mL Per Tube Q4H   hydrALAZINE  12.5 mg Per Tube Q8H   insulin aspart  0-20 Units Subcutaneous Q4H   insulin detemir  25 Units Subcutaneous BID   isosorbide mononitrate  20 mg Per Tube BID   levothyroxine  75 mcg Per Tube Q0600   melatonin  3 mg Per Tube QHS   metoprolol tartrate  37.5 mg Per Tube Q12H   nicotine  21 mg Transdermal Daily   pantoprazole  40 mg Per Tube Daily   revefenacin  175 mcg Nebulization Daily   ticagrelor  90 mg Oral BID   Or   ticagrelor  90 mg Per Tube BID   Continuous Infusions:  PRN Meds: acetaminophen **OR** acetaminophen (TYLENOL) oral liquid 160 mg/5 mL **OR** acetaminophen, docusate, influenza vac split quadrivalent PF, ipratropium-albuterol, metoprolol tartrate, nitroGLYCERIN, mouth rinse, oxyCODONE, pneumococcal 20-valent conjugate vaccine, polyethylene glycol, senna-docusate   Vital Signs    Vitals:   01/27/22 0342 01/27/22 0500 01/27/22 0531 01/27/22 0750  BP: (!) 124/96  116/77   Pulse: 90   90  Resp: 18     Temp: (!) 97.3 F (36.3 C)   98 F (36.7 C)  TempSrc: Oral   Oral  SpO2: 100%   99%  Weight:  84.6 kg    Height:        Intake/Output Summary (Last 24 hours) at 01/27/2022 0843 Last data filed at 01/26/2022 1300 Gross per 24 hour  Intake 367.5 ml  Output 700 ml  Net  -332.5 ml      01/27/2022    5:00 AM 01/25/2022    5:00 AM 01/23/2022    5:00 AM  Last 3 Weights  Weight (lbs) 186 lb 8.2 oz 184 lb 1.4 oz 184 lb 15.5 oz  Weight (kg) 84.6 kg 83.5 kg 83.9 kg      Telemetry    NSR, HR in the 80s-90s - Personally Reviewed  ECG     No new tracings since 9/23- Personally Reviewed  Physical Exam   GEN: No acute distress.  Laying in the bed with head elevated. Left IJ CVL with tegaderm dressing in place. NG tube in place  Neck: No JVD Cardiac: RRR, no murmurs, rubs, or gallops.  Respiratory: Clear to auscultation bilaterally. Normal WOB on room air  GI: Soft, nontender, non-distended  MS: No edema; left foot wrapped in bandages  Neuro:  Nonfocal  Psych: Normal affect   Labs    High Sensitivity Troponin:   Recent Labs  Lab 01/12/22 0203 01/13/22 1059 01/23/22 1500 01/23/22 2250 01/24/22 0050  TROPONINIHS 3,512* 2,880* 127* 132* 133*     Chemistry Recent Labs  Lab 01/25/22 0550 01/26/22 0555 01/27/22 0530  NA 134* 136 139  K 4.3 4.0 3.9  CL  99 102 101  CO2 25 28 28   GLUCOSE 290* 119* 134*  BUN 77* 64* 53*  CREATININE 2.34* 2.05* 1.87*  CALCIUM 8.4* 8.4* 8.8*  MG 3.0* 2.8* 2.7*  PROT 6.1* 5.8* 5.8*  ALBUMIN 2.4* 2.3* 2.3*  AST 39 31 29  ALT 33 29 29  ALKPHOS 110 98 98  BILITOT 0.5 0.7 0.5  GFRNONAA 26* 31* 35*  ANIONGAP 10 6 10     Lipids No results for input(s): "CHOL", "TRIG", "HDL", "LABVLDL", "LDLCALC", "CHOLHDL" in the last 168 hours.  Hematology Recent Labs  Lab 01/25/22 0550 01/26/22 0555 01/26/22 1414 01/27/22 0530  WBC 19.0* 12.0*  --  11.2*  RBC 2.61* 2.42*  --  2.91*  HGB 7.0* 6.5* 8.3* 8.1*  HCT 22.0* 20.4* 24.9* 24.9*  MCV 84.3 84.3  --  85.6  MCH 26.8 26.9  --  27.8  MCHC 31.8 31.9  --  32.5  RDW 22.2* 20.3*  --  18.4*  PLT 736* 648*  --  671*   Thyroid No results for input(s): "TSH", "FREET4" in the last 168 hours.  BNPNo results for input(s): "BNP", "PROBNP" in the last 168 hours.  DDimer No  results for input(s): "DDIMER" in the last 168 hours.   Radiology    No results found.  Cardiac Studies   Echocardiogram 01/10/2022  1. There is akinesis of the mid-to-apical septal LV segments, all apical  segments, and apex. There is significant thinning of the mid-to-apical  septal segments and apex likely represents scarring/infarct. The  basal-to-mid inferior and basal septal  segments are hypokinetic.   2. No LV thrombus visualized on definity imaging.   3. Left ventricular ejection fraction, by estimation, is 30 to 35%. The  left ventricle has moderately decreased function. The left ventricle  demonstrates regional wall motion abnormalities (see scoring  diagram/findings for description). The left  ventricular internal cavity size was mildly-to-moderately dilated. There  is mild concentric left ventricular hypertrophy. Indeterminate diastolic  filling due to E-A fusion.   4. Right ventricular systolic function is normal. The right ventricular  size is normal.   5. The mitral valve is normal in structure. Trivial mitral valve  regurgitation.   6. The aortic valve is tricuspid. Aortic valve regurgitation is not  visualized. No aortic stenosis is present.   7. The inferior vena cava is dilated in size with >50% respiratory  variability, suggesting right atrial pressure of 8 mmHg.   8. Agitated saline contrast bubble study was negative, with no evidence  of any interatrial shunt.   Comparison(s): Compared to prior TTE on 10/2021, the EF has dropped to  30-35% with similar wall motion.   Patient Profile     41 y.o. 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 She went immediately to IR for thrombectomy.  Cardiology was consulted on 01/11/2022 for further evaluation of EKG changes and elevated troponin.  Assessment & Plan    NSTEMI  CAD - Patient has a history of CAD with moderate to severe disease on cardiac catheterization in 2015 (treated medically). She presented this admission with an acute stroke and was  found to have elevated troponin. Hs troponin peaked at 3,869. Echo this admission shows LVEF of 30-30% with multiple wall motion abnormalities. - Patient denies chest pain  - Trop elevation is likely due to type 2 demand ischemia. No plans for inpatient ischemic workup given stroke and severe anemia (hemoglobin 6.5 yesterday requiring blood transfusion). Continue to manage CAD with medical therapy  - Palliative care has been involved this admission, patient is DNR. May transition to hospice if oral intake does not improve. Planning for CIR  - Continue Brilinta 90mg  twice daily, metoprolol 37.5 mg BID, Imdur 20mg  twice daily and Lipitor 40mg  daily. - Patient was receiving lovenox injections earlier this admission for treatment of upper extremity arterial occlusion. Lovenox held yesterday due to drop in hemoglobin/blood transfusion.    Acute on Chronic Combined CHF - Initial Echo on 9/10 showed LVEF of 30-35% with akinesis of the mid to apical septal LV segments, all apical segments, and apex as well as significant thinning of the mid to apical septal segments and apex likely representing scarring/infarct. 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  - Euvolemic on exam  - Will hold off on PO Lasix as patient continues to be euvolemic and is recovering from AKI. Patient currently net -3.8 L fluid since admission 2 - GDMT: Continue metoprolol 37.5 mg BID; Imdur 20mg  daily twice daily; NO ACEi/ARB/ARNI /MRA or SGLT2Ii given advanced CKD  -  Continue to monitor daily weights, strict I/Os, and renal function.    LV Apical Aneurysm - Despite repeated studies, no LV thrombus has been identified.  - Patient will require lifelong anticoagulation given right upper extremity arterial occlusion and stroke.  - Patient has been on Brilinta and lovenox this admission, however hemoglobin dropped to 6.5 yesterday and lovenox has been held. Resumption of lovenox per primary    Hypertension - BP mostly well controlled.  - on metoprolol tartrate 37.5 mg BID, Hydralazine 12.5mg  three times daily and Imdur 20mg  twice daily.   Hyperlipidemia - Lipid panel this admission: Total Cholesterol 85, Triglycerides 154, HDL 15, LDL 39. - on Lipitor 40mg  daily.   Acute on CKD stage IV - Baseline creatinine around 2.0 to 2.5.  Creatinine 2.15 on admission but peaked at 4.54 on 9/10. Recovering now Cr 1.87 today, diuresis held/not needed at this time      Right Brachial Artery Occlusion S/p thrombectomy in 10/2021. - On Eliquis prior to admission, transitioned to Therapeutic Lovenox - Lovenox held yesterday    Acute Stroke  Type 1 DM  Osteomyelitis of Left Foot Acute on Chronic Normocytic Anemia - Management per primary team.       Outpatient follow-up has been arranged on 02/19/22     For questions or updates, please contact [place] Please consult [email or web address] for contact info under        Signed, Margie Billet, PA-C  01/27/2022, 8:43 AM    Reviewed notes from IM/Palliative Main issues is stroke recovery, feeding tube with aspiration risk and refusal to consider PEG. Transfusion dependant anemia Lovenox held on Brillinta post brachial thrombectomy and left 4 th toe osteomyelitis. Cardiology will sign off   Jenkins Rouge MD [facility]

## 2022-01-27 NOTE — Progress Notes (Signed)
Inpatient Rehabilitation Admissions Coordinator   I met at bedside with patient and daughter to clarify questions concerning her pending Cir admit. I am hopeful for bed in next 24 to 48 hrs pending medical clearance by Acute MD as well as Rehab MD.  Danne Baxter, RN, MSN Rehab Admissions Coordinator [phone] 01/27/2022 1:32 PM

## 2022-01-28 ENCOUNTER — Other Ambulatory Visit (HOSPITAL_COMMUNITY): Payer: Self-pay

## 2022-01-28 ENCOUNTER — Inpatient Hospital Stay (HOSPITAL_COMMUNITY)
Admission: RE | Admit: 2022-01-28 | Discharge: 2022-02-19 | DRG: 056 | Disposition: A | Discharge status: Home Health | Payer: No Typology Code available for payment source | Source: Intra-hospital | Attending: Physical Medicine and Rehabilitation | Admitting: Physical Medicine and Rehabilitation

## 2022-01-28 DIAGNOSIS — M869 Osteomyelitis, unspecified: Secondary | ICD-10-CM | POA: Diagnosis present

## 2022-01-28 DIAGNOSIS — I63511 Cerebral infarction due to unspecified occlusion or stenosis of right middle cerebral artery: Secondary | ICD-10-CM | POA: Diagnosis not present

## 2022-01-28 DIAGNOSIS — Z6831 Body mass index (BMI) 31.0-31.9, adult: Secondary | ICD-10-CM

## 2022-01-28 DIAGNOSIS — J441 Chronic obstructive pulmonary disease with (acute) exacerbation: Secondary | ICD-10-CM | POA: Diagnosis present

## 2022-01-28 DIAGNOSIS — E11621 Type 2 diabetes mellitus with foot ulcer: Secondary | ICD-10-CM | POA: Diagnosis not present

## 2022-01-28 DIAGNOSIS — Z794 Long term (current) use of insulin: Secondary | ICD-10-CM

## 2022-01-28 DIAGNOSIS — E1022 Type 1 diabetes mellitus with diabetic chronic kidney disease: Secondary | ICD-10-CM | POA: Diagnosis present

## 2022-01-28 DIAGNOSIS — Z79899 Other long term (current) drug therapy: Secondary | ICD-10-CM

## 2022-01-28 DIAGNOSIS — Z741 Need for assistance with personal care: Secondary | ICD-10-CM | POA: Diagnosis present

## 2022-01-28 DIAGNOSIS — I959 Hypotension, unspecified: Secondary | ICD-10-CM

## 2022-01-28 DIAGNOSIS — D631 Anemia in chronic kidney disease: Secondary | ICD-10-CM | POA: Diagnosis present

## 2022-01-28 DIAGNOSIS — Z95820 Peripheral vascular angioplasty status with implants and grafts: Secondary | ICD-10-CM

## 2022-01-28 DIAGNOSIS — Z8249 Family history of ischemic heart disease and other diseases of the circulatory system: Secondary | ICD-10-CM

## 2022-01-28 DIAGNOSIS — D62 Acute posthemorrhagic anemia: Secondary | ICD-10-CM | POA: Diagnosis present

## 2022-01-28 DIAGNOSIS — Z66 Do not resuscitate: Secondary | ICD-10-CM | POA: Diagnosis present

## 2022-01-28 DIAGNOSIS — I69354 Hemiplegia and hemiparesis following cerebral infarction affecting left non-dominant side: Principal | ICD-10-CM

## 2022-01-28 DIAGNOSIS — E785 Hyperlipidemia, unspecified: Secondary | ICD-10-CM | POA: Diagnosis present

## 2022-01-28 DIAGNOSIS — I951 Orthostatic hypotension: Secondary | ICD-10-CM | POA: Diagnosis not present

## 2022-01-28 DIAGNOSIS — Z515 Encounter for palliative care: Secondary | ICD-10-CM | POA: Diagnosis not present

## 2022-01-28 DIAGNOSIS — I6522 Occlusion and stenosis of left carotid artery: Secondary | ICD-10-CM | POA: Diagnosis present

## 2022-01-28 DIAGNOSIS — E039 Hypothyroidism, unspecified: Secondary | ICD-10-CM | POA: Diagnosis present

## 2022-01-28 DIAGNOSIS — I130 Hypertensive heart and chronic kidney disease with heart failure and stage 1 through stage 4 chronic kidney disease, or unspecified chronic kidney disease: Secondary | ICD-10-CM | POA: Diagnosis present

## 2022-01-28 DIAGNOSIS — I2510 Atherosclerotic heart disease of native coronary artery without angina pectoris: Secondary | ICD-10-CM | POA: Diagnosis not present

## 2022-01-28 DIAGNOSIS — I252 Old myocardial infarction: Secondary | ICD-10-CM

## 2022-01-28 DIAGNOSIS — Z79890 Hormone replacement therapy (postmenopausal): Secondary | ICD-10-CM

## 2022-01-28 DIAGNOSIS — Z803 Family history of malignant neoplasm of breast: Secondary | ICD-10-CM

## 2022-01-28 DIAGNOSIS — N184 Chronic kidney disease, stage 4 (severe): Secondary | ICD-10-CM | POA: Diagnosis present

## 2022-01-28 DIAGNOSIS — I255 Ischemic cardiomyopathy: Secondary | ICD-10-CM | POA: Diagnosis not present

## 2022-01-28 DIAGNOSIS — E1051 Type 1 diabetes mellitus with diabetic peripheral angiopathy without gangrene: Secondary | ICD-10-CM | POA: Diagnosis present

## 2022-01-28 DIAGNOSIS — I5042 Chronic combined systolic (congestive) and diastolic (congestive) heart failure: Secondary | ICD-10-CM

## 2022-01-28 DIAGNOSIS — I69328 Other speech and language deficits following cerebral infarction: Secondary | ICD-10-CM

## 2022-01-28 DIAGNOSIS — J449 Chronic obstructive pulmonary disease, unspecified: Secondary | ICD-10-CM | POA: Diagnosis present

## 2022-01-28 DIAGNOSIS — I69392 Facial weakness following cerebral infarction: Secondary | ICD-10-CM

## 2022-01-28 DIAGNOSIS — Z833 Family history of diabetes mellitus: Secondary | ICD-10-CM

## 2022-01-28 DIAGNOSIS — R1310 Dysphagia, unspecified: Secondary | ICD-10-CM | POA: Diagnosis present

## 2022-01-28 DIAGNOSIS — F5104 Psychophysiologic insomnia: Secondary | ICD-10-CM | POA: Diagnosis present

## 2022-01-28 DIAGNOSIS — M62838 Other muscle spasm: Secondary | ICD-10-CM | POA: Diagnosis not present

## 2022-01-28 DIAGNOSIS — E669 Obesity, unspecified: Secondary | ICD-10-CM | POA: Diagnosis present

## 2022-01-28 DIAGNOSIS — Z823 Family history of stroke: Secondary | ICD-10-CM

## 2022-01-28 DIAGNOSIS — E1042 Type 1 diabetes mellitus with diabetic polyneuropathy: Secondary | ICD-10-CM | POA: Diagnosis present

## 2022-01-28 DIAGNOSIS — E559 Vitamin D deficiency, unspecified: Secondary | ICD-10-CM | POA: Diagnosis present

## 2022-01-28 DIAGNOSIS — Z89422 Acquired absence of other left toe(s): Secondary | ICD-10-CM | POA: Diagnosis not present

## 2022-01-28 DIAGNOSIS — I69391 Dysphagia following cerebral infarction: Secondary | ICD-10-CM

## 2022-01-28 DIAGNOSIS — Z888 Allergy status to other drugs, medicaments and biological substances status: Secondary | ICD-10-CM

## 2022-01-28 DIAGNOSIS — E1165 Type 2 diabetes mellitus with hyperglycemia: Secondary | ICD-10-CM | POA: Diagnosis not present

## 2022-01-28 DIAGNOSIS — M9689 Other intraoperative and postprocedural complications and disorders of the musculoskeletal system: Secondary | ICD-10-CM | POA: Diagnosis present

## 2022-01-28 DIAGNOSIS — Y835 Amputation of limb(s) as the cause of abnormal reaction of the patient, or of later complication, without mention of misadventure at the time of the procedure: Secondary | ICD-10-CM | POA: Diagnosis present

## 2022-01-28 DIAGNOSIS — Z87891 Personal history of nicotine dependence: Secondary | ICD-10-CM

## 2022-01-28 DIAGNOSIS — E6609 Other obesity due to excess calories: Secondary | ICD-10-CM | POA: Diagnosis present

## 2022-01-28 DIAGNOSIS — E1059 Type 1 diabetes mellitus with other circulatory complications: Secondary | ICD-10-CM | POA: Diagnosis present

## 2022-01-28 DIAGNOSIS — I10 Essential (primary) hypertension: Secondary | ICD-10-CM

## 2022-01-28 DIAGNOSIS — D689 Coagulation defect, unspecified: Secondary | ICD-10-CM | POA: Diagnosis present

## 2022-01-28 DIAGNOSIS — R4189 Other symptoms and signs involving cognitive functions and awareness: Secondary | ICD-10-CM | POA: Diagnosis present

## 2022-01-28 DIAGNOSIS — Z881 Allergy status to other antibiotic agents status: Secondary | ICD-10-CM

## 2022-01-28 DIAGNOSIS — E1069 Type 1 diabetes mellitus with other specified complication: Secondary | ICD-10-CM | POA: Diagnosis present

## 2022-01-28 DIAGNOSIS — D72829 Elevated white blood cell count, unspecified: Secondary | ICD-10-CM | POA: Diagnosis present

## 2022-01-28 DIAGNOSIS — I6501 Occlusion and stenosis of right vertebral artery: Secondary | ICD-10-CM | POA: Diagnosis present

## 2022-01-28 DIAGNOSIS — Z7901 Long term (current) use of anticoagulants: Secondary | ICD-10-CM

## 2022-01-28 DIAGNOSIS — Z9641 Presence of insulin pump (external) (internal): Secondary | ICD-10-CM | POA: Diagnosis present

## 2022-01-28 DIAGNOSIS — M25512 Pain in left shoulder: Secondary | ICD-10-CM | POA: Diagnosis not present

## 2022-01-28 DIAGNOSIS — I5043 Acute on chronic combined systolic (congestive) and diastolic (congestive) heart failure: Secondary | ICD-10-CM

## 2022-01-28 DIAGNOSIS — B974 Respiratory syncytial virus as the cause of diseases classified elsewhere: Secondary | ICD-10-CM | POA: Diagnosis present

## 2022-01-28 DIAGNOSIS — I7771 Dissection of carotid artery: Secondary | ICD-10-CM | POA: Diagnosis present

## 2022-01-28 DIAGNOSIS — Z83438 Family history of other disorder of lipoprotein metabolism and other lipidemia: Secondary | ICD-10-CM

## 2022-01-28 DIAGNOSIS — R2681 Unsteadiness on feet: Secondary | ICD-10-CM | POA: Diagnosis present

## 2022-01-28 DIAGNOSIS — S92343D Displaced fracture of fourth metatarsal bone, unspecified foot, subsequent encounter for fracture with routine healing: Secondary | ICD-10-CM

## 2022-01-28 DIAGNOSIS — E10621 Type 1 diabetes mellitus with foot ulcer: Secondary | ICD-10-CM | POA: Diagnosis present

## 2022-01-28 DIAGNOSIS — N179 Acute kidney failure, unspecified: Secondary | ICD-10-CM | POA: Diagnosis present

## 2022-01-28 DIAGNOSIS — E1065 Type 1 diabetes mellitus with hyperglycemia: Secondary | ICD-10-CM | POA: Diagnosis not present

## 2022-01-28 DIAGNOSIS — L97529 Non-pressure chronic ulcer of other part of left foot with unspecified severity: Secondary | ICD-10-CM | POA: Diagnosis not present

## 2022-01-28 DIAGNOSIS — Y848 Other medical procedures as the cause of abnormal reaction of the patient, or of later complication, without mention of misadventure at the time of the procedure: Secondary | ICD-10-CM | POA: Diagnosis present

## 2022-01-28 DIAGNOSIS — Z7902 Long term (current) use of antithrombotics/antiplatelets: Secondary | ICD-10-CM

## 2022-01-28 DIAGNOSIS — Z9049 Acquired absence of other specified parts of digestive tract: Secondary | ICD-10-CM

## 2022-01-28 DIAGNOSIS — I6521 Occlusion and stenosis of right carotid artery: Secondary | ICD-10-CM | POA: Diagnosis present

## 2022-01-28 DIAGNOSIS — Z7189 Other specified counseling: Secondary | ICD-10-CM | POA: Diagnosis not present

## 2022-01-28 DIAGNOSIS — S92342A Displaced fracture of fourth metatarsal bone, left foot, initial encounter for closed fracture: Secondary | ICD-10-CM | POA: Diagnosis present

## 2022-01-28 DIAGNOSIS — K219 Gastro-esophageal reflux disease without esophagitis: Secondary | ICD-10-CM | POA: Diagnosis present

## 2022-01-28 DIAGNOSIS — G4700 Insomnia, unspecified: Secondary | ICD-10-CM | POA: Diagnosis not present

## 2022-01-28 DIAGNOSIS — K5900 Constipation, unspecified: Secondary | ICD-10-CM | POA: Diagnosis not present

## 2022-01-28 DIAGNOSIS — Z832 Family history of diseases of the blood and blood-forming organs and certain disorders involving the immune mechanism: Secondary | ICD-10-CM

## 2022-01-28 DIAGNOSIS — E782 Mixed hyperlipidemia: Secondary | ICD-10-CM | POA: Diagnosis present

## 2022-01-28 DIAGNOSIS — T8781 Dehiscence of amputation stump: Secondary | ICD-10-CM | POA: Diagnosis not present

## 2022-01-28 DIAGNOSIS — Z6833 Body mass index (BMI) 33.0-33.9, adult: Secondary | ICD-10-CM

## 2022-01-28 HISTORY — DX: Cerebral infarction due to unspecified occlusion or stenosis of right middle cerebral artery: I63.511

## 2022-01-28 LAB — CBC WITH DIFFERENTIAL/PLATELET
Abs Immature Granulocytes: 0.14 10*3/uL — ABNORMAL HIGH (ref 0.00–0.07)
Basophils Absolute: 0.1 10*3/uL (ref 0.0–0.1)
Basophils Relative: 1 %
Eosinophils Absolute: 0.3 10*3/uL (ref 0.0–0.5)
Eosinophils Relative: 2 %
HCT: 25.5 % — ABNORMAL LOW (ref 36.0–46.0)
Hemoglobin: 8.1 g/dL — ABNORMAL LOW (ref 12.0–15.0)
Immature Granulocytes: 1 %
Lymphocytes Relative: 18 %
Lymphs Abs: 2 10*3/uL (ref 0.7–4.0)
MCH: 27.6 pg (ref 26.0–34.0)
MCHC: 31.8 g/dL (ref 30.0–36.0)
MCV: 86.7 fL (ref 80.0–100.0)
Monocytes Absolute: 0.6 10*3/uL (ref 0.1–1.0)
Monocytes Relative: 6 %
Neutro Abs: 7.9 10*3/uL — ABNORMAL HIGH (ref 1.7–7.7)
Neutrophils Relative %: 72 %
Platelets: 665 10*3/uL — ABNORMAL HIGH (ref 150–400)
RBC: 2.94 MIL/uL — ABNORMAL LOW (ref 3.87–5.11)
RDW: 17.5 % — ABNORMAL HIGH (ref 11.5–15.5)
WBC: 11 10*3/uL — ABNORMAL HIGH (ref 4.0–10.5)
nRBC: 0 % (ref 0.0–0.2)

## 2022-01-28 LAB — COMPREHENSIVE METABOLIC PANEL
ALT: 31 U/L (ref 0–44)
AST: 33 U/L (ref 15–41)
Albumin: 2.4 g/dL — ABNORMAL LOW (ref 3.5–5.0)
Alkaline Phosphatase: 100 U/L (ref 38–126)
Anion gap: 8 (ref 5–15)
BUN: 47 mg/dL — ABNORMAL HIGH (ref 6–20)
CO2: 25 mmol/L (ref 22–32)
Calcium: 8.4 mg/dL — ABNORMAL LOW (ref 8.9–10.3)
Chloride: 101 mmol/L (ref 98–111)
Creatinine, Ser: 1.98 mg/dL — ABNORMAL HIGH (ref 0.44–1.00)
GFR, Estimated: 32 mL/min — ABNORMAL LOW (ref >60)
Glucose, Bld: 211 mg/dL — ABNORMAL HIGH (ref 70–99)
Potassium: 3.9 mmol/L (ref 3.5–5.1)
Sodium: 134 mmol/L — ABNORMAL LOW (ref 135–145)
Total Bilirubin: 0.6 mg/dL (ref 0.3–1.2)
Total Protein: 5.9 g/dL — ABNORMAL LOW (ref 6.5–8.1)

## 2022-01-28 LAB — GLUCOSE, CAPILLARY
Glucose-Capillary: 216 mg/dL — ABNORMAL HIGH (ref 70–99)
Glucose-Capillary: 125 mg/dL — ABNORMAL HIGH (ref 70–99)
Glucose-Capillary: 83 mg/dL (ref 70–99)
Glucose-Capillary: 186 mg/dL — ABNORMAL HIGH (ref 70–99)
Glucose-Capillary: 236 mg/dL — ABNORMAL HIGH (ref 70–99)

## 2022-01-28 LAB — PHOSPHORUS: Phosphorus: 5.2 mg/dL — ABNORMAL HIGH (ref 2.5–4.6)

## 2022-01-28 LAB — MAGNESIUM: Magnesium: 2.5 mg/dL — ABNORMAL HIGH (ref 1.7–2.4)

## 2022-01-28 LAB — TSH: TSH: 41.777 u[IU]/mL — ABNORMAL HIGH (ref 0.350–4.500)

## 2022-01-28 MED ORDER — DIPHENHYDRAMINE HCL 12.5 MG/5ML PO ELIX: 12.5000 mg | ORAL_SOLUTION | Freq: Four times a day (QID) | ORAL | Status: DC | PRN

## 2022-01-28 MED ORDER — HYDRALAZINE HCL 25 MG PO TABS
12.5000 mg | ORAL_TABLET | Freq: Three times a day (TID) | ORAL | Status: DC
  Administered 2022-01-28 – 2022-02-01 (×11): 12.5 mg via ORAL
  Filled 2022-01-28 (×12): qty 1

## 2022-01-28 MED ORDER — HYDRALAZINE HCL 25 MG PO TABS: 12.5000 mg | ORAL_TABLET | Freq: Three times a day (TID) | ORAL | 0 refills | Status: DC

## 2022-01-28 MED ORDER — ENOXAPARIN SODIUM 100 MG/ML IJ SOSY
1.0000 mg/kg | PREFILLED_SYRINGE | Freq: Two times a day (BID) | INTRAMUSCULAR | Status: DC
  Filled 2022-01-28: qty 0.9

## 2022-01-28 MED ORDER — LEVOTHYROXINE SODIUM 75 MCG PO TABS
75.0000 ug | ORAL_TABLET | Freq: Every day | ORAL | Status: DC
  Administered 2022-01-29: 75 ug via ORAL
  Filled 2022-01-28: qty 1

## 2022-01-28 MED ORDER — SENNOSIDES-DOCUSATE SODIUM 8.6-50 MG PO TABS
1.0000 | ORAL_TABLET | Freq: Every evening | ORAL | Status: DC | PRN
  Administered 2022-02-14: 1 via ORAL
  Filled 2022-01-28: qty 1

## 2022-01-28 MED ORDER — TRAZODONE HCL 50 MG PO TABS
25.0000 mg | ORAL_TABLET | Freq: Every evening | ORAL | Status: DC | PRN
  Administered 2022-01-31 – 2022-02-03 (×3): 50 mg via ORAL
  Filled 2022-01-28 (×3): qty 1

## 2022-01-28 MED ORDER — PROCHLORPERAZINE EDISYLATE 10 MG/2ML IJ SOLN: 5.0000 mg | Freq: Four times a day (QID) | INTRAMUSCULAR | Status: DC | PRN

## 2022-01-28 MED ORDER — TICAGRELOR 90 MG PO TABS
90.0000 mg | ORAL_TABLET | Freq: Two times a day (BID) | ORAL | Status: DC
  Administered 2022-01-28 – 2022-02-19 (×44): 90 mg via ORAL
  Filled 2022-01-28 (×43): qty 1

## 2022-01-28 MED ORDER — INSULIN ASPART 100 UNIT/ML IJ SOLN
0.0000 [IU] | Freq: Three times a day (TID) | INTRAMUSCULAR | Status: DC
  Administered 2022-01-28: 4 [IU] via SUBCUTANEOUS
  Administered 2022-01-29: 20 [IU] via SUBCUTANEOUS
  Administered 2022-01-29: 15 [IU] via SUBCUTANEOUS
  Administered 2022-01-29: 11 [IU] via SUBCUTANEOUS
  Administered 2022-01-30 – 2022-01-31 (×4): 7 [IU] via SUBCUTANEOUS
  Administered 2022-01-31: 4 [IU] via SUBCUTANEOUS
  Administered 2022-02-01: 7 [IU] via SUBCUTANEOUS
  Administered 2022-02-01: 15 [IU] via SUBCUTANEOUS
  Administered 2022-02-02: 7 [IU] via SUBCUTANEOUS
  Administered 2022-02-02 – 2022-02-03 (×4): 11 [IU] via SUBCUTANEOUS
  Administered 2022-02-04: 7 [IU] via SUBCUTANEOUS
  Administered 2022-02-04 (×2): 4 [IU] via SUBCUTANEOUS
  Administered 2022-02-05: 11 [IU] via SUBCUTANEOUS

## 2022-01-28 MED ORDER — TICAGRELOR 90 MG PO TABS
90.0000 mg | ORAL_TABLET | Freq: Two times a day (BID) | ORAL | Status: DC
  Filled 2022-01-28 (×11): qty 1

## 2022-01-28 MED ORDER — METHOCARBAMOL 500 MG PO TABS
500.0000 mg | ORAL_TABLET | Freq: Four times a day (QID) | ORAL | Status: DC | PRN
  Administered 2022-01-28: 500 mg via ORAL
  Filled 2022-01-28 (×2): qty 1

## 2022-01-28 MED ORDER — ATORVASTATIN CALCIUM 40 MG PO TABS
40.0000 mg | ORAL_TABLET | Freq: Every day | ORAL | Status: DC
  Administered 2022-01-29 – 2022-02-09 (×12): 40 mg
  Filled 2022-01-28 (×12): qty 1

## 2022-01-28 MED ORDER — OXYCODONE HCL 5 MG PO TABS
5.0000 mg | ORAL_TABLET | Freq: Four times a day (QID) | ORAL | Status: DC | PRN
  Administered 2022-01-28 – 2022-02-19 (×24): 5 mg via ORAL
  Filled 2022-01-28 (×30): qty 1

## 2022-01-28 MED ORDER — MELATONIN 3 MG PO TABS
3.0000 mg | ORAL_TABLET | Freq: Every day | ORAL | Status: DC
  Administered 2022-01-28 – 2022-02-13 (×15): 3 mg via ORAL
  Filled 2022-01-28 (×17): qty 1

## 2022-01-28 MED ORDER — ARFORMOTEROL TARTRATE 15 MCG/2ML IN NEBU
15.0000 ug | INHALATION_SOLUTION | Freq: Two times a day (BID) | RESPIRATORY_TRACT | Status: DC
  Administered 2022-01-29: 15 ug via RESPIRATORY_TRACT
  Filled 2022-01-28 (×3): qty 2

## 2022-01-28 MED ORDER — GUAIFENESIN-DM 100-10 MG/5ML PO SYRP: 5.0000 mL | ORAL_SOLUTION | Freq: Four times a day (QID) | ORAL | Status: DC | PRN

## 2022-01-28 MED ORDER — ENOXAPARIN SODIUM 80 MG/0.8ML IJ SOSY: 80.0000 mg | PREFILLED_SYRINGE | Freq: Every day | INTRAMUSCULAR | 0 refills | Status: DC

## 2022-01-28 MED ORDER — ACETAMINOPHEN 325 MG PO TABS
325.0000 mg | ORAL_TABLET | ORAL | Status: DC | PRN
  Administered 2022-01-30 – 2022-02-16 (×21): 650 mg via ORAL
  Filled 2022-01-28 (×24): qty 2

## 2022-01-28 MED ORDER — ISOSORBIDE MONONITRATE 20 MG PO TABS
20.0000 mg | ORAL_TABLET | Freq: Two times a day (BID) | ORAL | Status: DC
  Administered 2022-01-28 – 2022-02-19 (×44): 20 mg via ORAL
  Filled 2022-01-28 (×46): qty 1

## 2022-01-28 MED ORDER — ARFORMOTEROL TARTRATE 15 MCG/2ML IN NEBU: 15.0000 ug | INHALATION_SOLUTION | Freq: Two times a day (BID) | RESPIRATORY_TRACT | 0 refills | Status: DC

## 2022-01-28 MED ORDER — PNEUMOCOCCAL 20-VAL CONJ VACC 0.5 ML IM SUSY: 0.5000 mL | PREFILLED_SYRINGE | INTRAMUSCULAR | Status: DC | PRN

## 2022-01-28 MED ORDER — BUDESONIDE 0.5 MG/2ML IN SUSP: 0.5000 mg | Freq: Two times a day (BID) | RESPIRATORY_TRACT | 12 refills | Status: DC

## 2022-01-28 MED ORDER — METOPROLOL TARTRATE 37.5 MG PO TABS: 37.5000 mg | ORAL_TABLET | Freq: Two times a day (BID) | ORAL | 0 refills | Status: DC

## 2022-01-28 MED ORDER — REVEFENACIN 175 MCG/3ML IN SOLN
175.0000 ug | Freq: Every day | RESPIRATORY_TRACT | Status: DC
  Administered 2022-01-31 – 2022-02-08 (×6): 175 ug via RESPIRATORY_TRACT
  Filled 2022-01-28 (×17): qty 3

## 2022-01-28 MED ORDER — INFLUENZA VAC SPLIT QUAD 0.5 ML IM SUSY: 0.5000 mL | PREFILLED_SYRINGE | INTRAMUSCULAR | Status: DC | PRN

## 2022-01-28 MED ORDER — ISOSORBIDE MONONITRATE 20 MG PO TABS: 20.0000 mg | ORAL_TABLET | Freq: Two times a day (BID) | ORAL | 0 refills | Status: DC

## 2022-01-28 MED ORDER — IPRATROPIUM-ALBUTEROL 0.5-2.5 (3) MG/3ML IN SOLN: 3.0000 mL | RESPIRATORY_TRACT | Status: DC | PRN

## 2022-01-28 MED ORDER — REVEFENACIN 175 MCG/3ML IN SOLN: 175.0000 ug | Freq: Every day | RESPIRATORY_TRACT | Status: DC

## 2022-01-28 MED ORDER — SORBITOL 70 % SOLN
30.0000 mL | Freq: Every day | Status: DC | PRN
  Filled 2022-01-28: qty 30

## 2022-01-28 MED ORDER — PROCHLORPERAZINE 25 MG RE SUPP: 12.5000 mg | Freq: Four times a day (QID) | RECTAL | Status: DC | PRN

## 2022-01-28 MED ORDER — TICAGRELOR 90 MG PO TABS: 90.0000 mg | ORAL_TABLET | Freq: Two times a day (BID) | ORAL | 0 refills | Status: DC

## 2022-01-28 MED ORDER — INSULIN DETEMIR 100 UNIT/ML ~~LOC~~ SOLN: 25.0000 [IU] | Freq: Two times a day (BID) | SUBCUTANEOUS | 11 refills | Status: DC

## 2022-01-28 MED ORDER — INSULIN DETEMIR 100 UNIT/ML ~~LOC~~ SOLN
25.0000 [IU] | Freq: Two times a day (BID) | SUBCUTANEOUS | Status: DC
  Administered 2022-01-28 – 2022-01-29 (×2): 25 [IU] via SUBCUTANEOUS
  Filled 2022-01-28 (×4): qty 0.25

## 2022-01-28 MED ORDER — INSULIN ASPART 100 UNIT/ML IJ SOLN: 0.0000 [IU] | INTRAMUSCULAR | 11 refills | Status: DC

## 2022-01-28 MED ORDER — ALUM & MAG HYDROXIDE-SIMETH 200-200-20 MG/5ML PO SUSP
30.0000 mL | ORAL | Status: DC | PRN
  Administered 2022-01-29 – 2022-02-04 (×5): 30 mL via ORAL
  Filled 2022-01-28 (×7): qty 30

## 2022-01-28 MED ORDER — PROCHLORPERAZINE MALEATE 5 MG PO TABS
5.0000 mg | ORAL_TABLET | Freq: Four times a day (QID) | ORAL | Status: DC | PRN
  Administered 2022-02-03: 5 mg via ORAL
  Administered 2022-02-08: 10 mg via ORAL
  Administered 2022-02-15: 5 mg via ORAL
  Administered 2022-02-16 – 2022-02-17 (×3): 10 mg via ORAL
  Filled 2022-01-28: qty 1
  Filled 2022-01-28 (×4): qty 2
  Filled 2022-01-28: qty 1

## 2022-01-28 MED ORDER — METOPROLOL TARTRATE 25 MG PO TABS
37.5000 mg | ORAL_TABLET | Freq: Two times a day (BID) | ORAL | Status: DC
  Administered 2022-01-28 – 2022-02-03 (×12): 37.5 mg via ORAL
  Filled 2022-01-28 (×12): qty 1

## 2022-01-28 MED ORDER — NICOTINE 21 MG/24HR TD PT24
21.0000 mg | MEDICATED_PATCH | Freq: Every day | TRANSDERMAL | Status: DC
  Administered 2022-01-29 – 2022-02-19 (×22): 21 mg via TRANSDERMAL
  Filled 2022-01-28 (×22): qty 1

## 2022-01-28 MED ORDER — LIVING WELL WITH DIABETES BOOK
Freq: Once | Status: AC
  Filled 2022-01-28: qty 1

## 2022-01-28 MED ORDER — ENOXAPARIN SODIUM 80 MG/0.8ML IJ SOSY
80.0000 mg | PREFILLED_SYRINGE | INTRAMUSCULAR | Status: DC
  Administered 2022-01-28 – 2022-01-31 (×4): 80 mg via SUBCUTANEOUS
  Filled 2022-01-28 (×4): qty 0.8

## 2022-01-28 MED ORDER — FLEET ENEMA 7-19 GM/118ML RE ENEM
1.0000 | ENEMA | Freq: Once | RECTAL | Status: DC | PRN
  Filled 2022-01-28: qty 1

## 2022-01-28 MED ORDER — BUDESONIDE 0.5 MG/2ML IN SUSP
0.5000 mg | Freq: Two times a day (BID) | RESPIRATORY_TRACT | Status: DC
  Administered 2022-01-29: 0.5 mg via RESPIRATORY_TRACT
  Filled 2022-01-28 (×4): qty 2

## 2022-01-28 NOTE — H&P (Signed)
Physical Medicine and Rehabilitation Admission H&P     CC: Functional deficits secondary to acute right MCA patchy infarct and punctate left MCA/ACA infarct with right ICA occlusion status post IR with TICI 3   HPI: Lauren [name] is a 41 year old female who presented to the emergency department on 05/10/2021 with unsteady gait left-sided facial droop left arm weakness with drift and slurred speech.  Code stroke called.  She was taking Eliquis secondary to recent upper extremity thromboembolus and history of coagulopathy.  Neurology consulted.  Imaging revealed age-indeterminate occlusion of proximal right ICA, V3 segment of right vertebral artery, left paraclinoid ICA stenosis.  She underwent left vertebral arteriogram with right common carotid arteriogram by Dr. Estanislado Pandy. Complete revascularization of the internal carotid artery achieved.  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  She was loaded with aspirin, Brilinta via orogastric tube.  She was left intubated.     Nephrology was consulted for renal failure on 9/10. Cardiology consulted and recent echo revealed some decrease in EF to approximately 30 to 35%.  Anemia noted and she was transfused 1 unit of packed red blood cells.  Elevated troponins with unremarkable EKG and hypertension therefore treated with nitroglycerin and heparin infusion.  Infectious disease consultation obtained on 9/14 due to prior left foot osteomyelitis.  Blood cultures negative.  Augmentin continued.  Hematology consulted on 9/14 for additional anticoagulation recommendations.  Podiatry consulted regarding bone biopsy of the left foot.  Augmentin discontinued.  Bone biopsy of the fourth metatarsal left foot was sent for culture and pathology performed at the bedside by Dr. Amalia Hailey 9/16.  Broad-spectrum antibiotics were adjusted.  She was extubated on 9/18.  Transferred to hospitalist service.  She has  had fairly stable hemoglobin since transfusion.  Heparin infusion discontinued on 9/21 and she was started on Lovenox 80 mg twice daily.  Dosing was decreased to every 24 hours on 9/24.  Hypercoagulable work-up negative with some labs pending.  Hematology recommends outpatient referral. Bone culture negative now off antibiotics.   She underwent SPL evaluation and has been upgraded to dysphagia 3 diet.  BUN and creatinine on downward trend, unknown baseline.  Leukocytosis trending downward.  She has been afebrile over the past week. The patient requires inpatient physical medicine and rehabilitation evaluations and treatment secondary to dysfunction due to right MCA stroke.   Her past medical history significant for CAD medically managed, diabetes mellitus, stage IV kidney disease, prior CVA and coagulopathy.  She required right upper extremity embolectomy 11/18/2021.  She was placed on Eliquis.  She also has a history of left foot osteomyelitis and is status post multiple debridements and partial left fifth ray resection by podiatry.  Also has history of congestive heart failure and tobacco abuse.   2D echo showed EF of 30% with LDL 39, A1c 8.0     ROS  ROS: Denies fevers, chills, N/V, abdominal pain, constipation, diarrhea, SOB, cough, chest pain, new weakness or paraesthesias.        Past Medical History:  Diagnosis Date   Anemia     CAD (coronary artery disease)      a. s/p cath in 03/2014 showing 30% mid-LAD, moderate to severe disease along small D1, patent LCx, moderate to severe distal OM2 stenosis and moderate diffuse diease along RCA not amenable to PCI   CHF (congestive heart failure) ([place])      a. EF 55-60% in 12/2019 b. EF at 35-40% by  echo in 05/2020   CKD (chronic kidney disease) stage 4, GFR 15-29 ml/min (HCC)     Diabetes mellitus without complication ([place])     Myocardial infarction ([place])     Neuropathy     Stroke [facility])           Past Surgical History:  Procedure  Laterality Date   AMPUTATION Left 09/02/2021    Procedure: AMPUTATION RAY;  Surgeon: Criselda Peaches, DPM;  Location: [facility];  Service: Podiatry;  Laterality: Left;  sagittal saw, 3L bag saline & Pulse   Cardiac catherization       CHOLECYSTECTOMY       I & D EXTREMITY Left 08/30/2021    Procedure: IRRIGATION AND DEBRIDEMENT WITH BONE BIOPSY;  Surgeon: [name], DPM;  Location: [facility];  Service: Podiatry;  Laterality: Left;   IR ANGIO VERTEBRAL SEL SUBCLAVIAN INNOMINATE UNI L MOD SED   01/18/2022   IR CT HEAD LTD   01/08/2022   IR INTRA CRAN STENT   01/08/2022   IR PERCUTANEOUS ART THROMBECTOMY/INFUSION INTRACRANIAL INC DIAG ANGIO   01/08/2022   IRRIGATION AND DEBRIDEMENT FOOT Left 09/04/2021    Procedure: IRRIGATION AND DEBRIDEMENT FOOT AND CLOSURE;  Surgeon: Criselda Peaches, DPM;  Location: [place];  Service: Podiatry;  Laterality: Left;   RADIOLOGY WITH ANESTHESIA N/A 01/08/2022    Procedure: IR WITH ANESTHESIA;  Surgeon: Radiologist, Medication, MD;  Location: [place];  Service: Radiology;  Laterality: N/A;   THROMBECTOMY BRACHIAL ARTERY Right 11/18/2021    Procedure: RIGHT BRACHIAL, RADIAL, & ULNAR ARTERY THROMBECTOMY.;  Surgeon: Marty Heck, MD;  [facility];  Service: Vascular;  Laterality: Right;   TUBAL LIGATION             Family History  Problem Relation Age of Onset   Thyroid disease Mother     Hypertension Mother     Hyperlipidemia Mother     CAD Mother     CVA Mother     Hyperlipidemia Father     Hypertension Father     CAD Father     Breast cancer Paternal Grandmother 52   Cancer Paternal Grandmother          lung    Social History:  reports that she quit smoking about 15 months ago. Her smoking use included cigarettes. She smoked an average of .25 packs per day. She has never used smokeless tobacco. She reports that she does not drink alcohol and does not use drugs. Allergies:       Allergies  Allergen Reactions   Wellbutrin [Bupropion] Hives   Cefepime Rash       Tolerates penicilllin   Ciprofloxacin Hcl Hives and Rash      Hives/rash at injection site; 01/15/22 tolerated IV cipro   Tape Rash          Medications Prior to Admission  Medication Sig Dispense Refill   apixaban (ELIQUIS) 5 MG TABS tablet Take 1 tablet by mouth twice daily 60 tablet 1   [EXPIRED] amoxicillin-clavulanate (AUGMENTIN) 875-125 MG tablet Take 1 tablet by mouth 2 (two) times daily for 14 days. 28 tablet 0   atorvastatin (LIPITOR) 40 MG tablet TAKE 1 TABLET BY MOUTH ONCE DAILY. (Patient taking differently: Take 40 mg by mouth daily.) 90 tablet 1   clopidogrel (PLAVIX) 75 MG tablet Take 1 tablet (75 mg total) by mouth daily. 90 tablet 3   Continuous Blood Gluc Sensor (DEXCOM G6 SENSOR) MISC Change sensor every 10  days as directed 9 each 3   Continuous Blood Gluc Transmit (DEXCOM G6 TRANSMITTER) MISC Change transmitter every 90 days as directed. 1 each 3   fenofibrate 160 MG tablet TAKE 1 TABLET BY MOUTH ONCE DAILY. (Patient taking differently: Take 160 mg by mouth daily.) 90 tablet 3   furosemide (LASIX) 40 MG tablet Take 1 tablet (40 mg total) by mouth daily. Take daily or as directed (Patient taking differently: Take 40 mg by mouth daily as needed for fluid or edema.) 90 tablet 3   hydrALAZINE (APRESOLINE) 25 MG tablet Take 1 & 1/2 tablets (37.5 mg total) by mouth 3 (three) times daily. (Patient taking differently: Take 25 mg by mouth 3 (three) times daily.) 135 tablet 5   Insulin Disposable Pump (OMNIPOD 5 G6 INTRO, GEN 5,) KIT Change pod every 48-72 hrs 1 kit 0   Insulin Disposable Pump (OMNIPOD 5 G6 POD, GEN 5,) MISC Change pod every 48-72 hours 25 each 6   isosorbide mononitrate (IMDUR) 30 MG 24 hr tablet Take 1 tablet (30 mg total) by mouth daily. 90 tablet 3   levothyroxine (SYNTHROID) 175 MCG tablet Take 175 mcg by mouth daily before breakfast.       metoprolol succinate (TOPROL-XL) 25 MG 24 hr tablet Take 1 tablet (25 mg total) by mouth daily. 90 tablet 3   nitroGLYCERIN  (NITROSTAT) 0.4 MG SL tablet Place 1 tablet (0.4 mg total) under the tongue every 5 (five) minutes x 3 doses as needed for chest pain. 25 tablet 3   oxyCODONE (OXY IR/ROXICODONE) 5 MG immediate release tablet Take 1 tablet (5 mg total) by mouth every 6 (six) hours as needed for moderate pain. 20 tablet 0   pantoprazole (PROTONIX) 40 MG tablet Take 1 tablet (40 mg total) by mouth daily as needed for acid reflux (Patient taking differently: Take 40 mg by mouth daily as needed (heartburn).) 90 tablet 2   potassium chloride SA (KLOR-CON M) 20 MEQ tablet Take 1 tablet (20 mEq total) by mouth daily 90 tablet 3   predniSONE (DELTASONE) 5 MG tablet 6 day taper; Take as directed on written instructions provided during visit (Patient not taking: Reported on 12/22/2021) 21 tablet 0          Home: Home Living Family/patient expects to be discharged to:: Skilled nursing facility (CIR) Living Arrangements: Spouse/significant other Available Help at Discharge: Family, Available 24 hours/day Type of Home: Mobile home Home Access: Stairs to enter [employer] of Steps: 5 Entrance Stairs-Rails: Right, Left Home Layout: One level Bathroom Shower/Tub: Multimedia programmer: Standard Bathroom Accessibility: Yes Home Equipment: None Additional Comments: post op shoe for L foot s/p toe amputation  Lives With: Spouse   Functional History: Prior Function Prior Level of Function : Independent/Modified Independent Mobility Comments: no AD ADLs Comments: works as Arboriculturist Status:  Mobility: Bed Mobility Overal bed mobility: Needs Assistance Bed Mobility: Supine to Sit, Sit to Supine, Rolling Rolling: Min assist Sidelying to sit: Mod assist Supine to sit: Max assist, HOB elevated Sit to supine: Mod assist General bed mobility comments: Pt impulsive when getting up to the side of the bed. Pt, despite cues to move one step at a time, attempts to get up unsupervised in a  impulsive, unsafe fashion.  Talked to family at length about giving one simple command at a time to attempt to get pt to slow down and become safer with mobility. Transfers Overall transfer level: Needs assistance Equipment used: 2  person hand held assist (face to face transfer with gait belt and bed pad) Transfers: Sit to/from Stand, Bed to chair/wheelchair/BSC Sit to Stand: +2 physical assistance, Mod assist Bed to/from chair/wheelchair/BSC transfer type:: Step pivot Squat pivot transfers: Max assist, +2 physical assistance Step pivot transfers: Max assist, +2 physical assistance General transfer comment: Pt focused on being cold today and unwilling to attempt sit to stand.  Addressed lateral scoots in bed Ambulation/Gait General Gait Details: unable at this time   ADL: ADL Overall ADL's : Needs assistance/impaired Eating/Feeding: NPO Grooming: Moderate assistance, Sitting Grooming Details (indicate cue type and reason): Pt requires max cues to finish the task. Pt will start task and become distracted soon after starting. Upper Body Bathing: Moderate assistance, Sitting (in recliner) Upper Body Bathing Details (indicate cue type and reason): Washing LUE with mod A for placement of LUE; RUE performing washing task. Pt with greater ability to compensate (L head turn and switching gaze toward L ~10 degrees during this task) Lower Body Bathing: Total assistance Upper Body Dressing : Maximal assistance, Sitting Lower Body Dressing: Total assistance, Bed level Toilet Transfer: Maximal assistance, +2 for physical assistance, BSC/3in1 Toilet Transfer Details (indicate cue type and reason): Pt unable to fully stand today c/o pain in LLE. Pt scooted up and down bed with max assist of 1 and use of pad to prepare for transfers to [facility]. Pt unwilling to leave bed bc pt was cold. Toileting- Clothing Manipulation and Hygiene: Total assistance Functional mobility during ADLs: Maximal assistance, +2 for  physical assistance, +2 for safety/equipment General ADL Comments: bed level ROM for L hemi body, transferred to sitting with min-max A sitting balance and stood EOB x2   Cognition: Cognition Overall Cognitive Status: Impaired/Different from baseline Arousal/Alertness: Lethargic Orientation Level: Oriented X4 Attention: Focused, Sustained Focused Attention: Impaired Focused Attention Impairment: Verbal basic Sustained Attention: Impaired Sustained Attention Impairment: Verbal basic Problem Solving: Impaired Problem Solving Impairment: Verbal basic Cognition Arousal/Alertness: Awake/alert Behavior During Therapy: Impulsive, Restless Overall Cognitive Status: Impaired/Different from baseline Area of Impairment: Attention, Memory, Safety/judgement, Awareness, Problem solving, Following commands Orientation Level: Disoriented to, Time, Situation, Place Current Attention Level: Focused Memory: Decreased short-term memory Following Commands: Follows one step commands consistently Safety/Judgement: Decreased awareness of safety, Decreased awareness of deficits Awareness: Emergent (states " my body is not doing as it should") Problem Solving: Slow processing, Difficulty sequencing, Requires verbal cues, Requires tactile cues General Comments: Pt significantley distracted today and perseverated on being cold and needing blankets. Pt requires constant redirection back to task and constant cues to tend to her L side. Pt with severe L neglect of body and environment. Difficult to assess due to: Impaired communication   Physical Exam: Blood pressure (!) 132/91, pulse 95, temperature 98.5 F (36.9 C), temperature source Oral, resp. rate 18, height _0  (1.651 m), weight 90.7 kg, last menstrual period 11/01/2021, SpO2 99 %. Physical Exam Constitutional: No apparent distress. Appropriate appearance for age.  HENT: No JVD. Neck Supple. Trachea midline. Atraumatic, normocephalic. + L IJ line Eyes:  PERRLA. EOMI, although difficult to test due to need for repeated verbal cues to overcome left inattention. Cardiovascular: RRR, no murmurs/rub/gallops. No Edema. Peripheral pulses 2+  Respiratory: CTAB. No rales, rhonchi, or wheezing. On RA.  Abdomen: + bowel sounds, normoactive. No distention or tenderness.  GU: Not examined. + external catheter, draining clear urine.  Skin: + L 5th toe amputation; clean, dry without discharge.  MSK:      No apparent deformity.  Strength:                RUE: 5/5 SA, 5/5 EF, 5/5 EE, 5/5 WE, 5/5 FF, 5/5 FA                 LUE: flaccid, 0/5 throughout                RLE: 5/5 HF, 5/5 KE, 5/5 DF, 5/5 EHL, 5/5 PF                 LLE:  3/5 HF, 2/5 KE, 0/5 DF, 0/5 EHL, 1/5 PF   Neurologic exam:  Cognition: AAO to person, place, time and event.  Language: Fluent, No substitutions or neoglisms.Moderate dysarthria. Names 3/3 objects correctly.  Memory: Recalls 3/3 objects at 5 minutes.  Insight: Good insight into current condition.  Mood: Pleasant affect, appropriate mood.  Sensation: To light touch intact in BL UEs and LEs  Reflexes: 3+ in LUE and 1+ in LLE, 2+ on RUE and RLE. Negative Hoffman's and babinski signs bilaterally.  CN: + L facial droop, otherwise intact  Coordination: No apparent tremors or ataxia.  Spasticity: MAS 0 in all extremities.       Lab Results Last 48 Hours        Results for orders placed or performed during the hospital encounter of 01/08/22 (from the past 48 hour(s))  Glucose, capillary     Status: Abnormal    Collection Time: 01/26/22 12:15 PM  Result Value Ref Range    Glucose-Capillary 155 (H) 70 - 99 mg/dL      Comment: Glucose reference range applies only to samples taken after fasting for at least 8 hours.    Comment 1 Notify RN      Comment 2 Document in Chart    Hemoglobin and hematocrit, blood     Status: Abnormal    Collection Time: 01/26/22  2:14 PM  Result Value Ref Range    Hemoglobin 8.3 (L) 12.0 - 15.0  g/dL      Comment: REPEATED TO VERIFY POST TRANSFUSION SPECIMEN      HCT 24.9 (L) 36.0 - 46.0 %      Comment: Performed at [facility] [address], [place], [place] [address]  Glucose, capillary     Status: Abnormal    Collection Time: 01/26/22  4:29 PM  Result Value Ref Range    Glucose-Capillary 264 (H) 70 - 99 mg/dL      Comment: Glucose reference range applies only to samples taken after fasting for at least 8 hours.    Comment 1 Notify RN      Comment 2 Document in Chart    Glucose, capillary     Status: Abnormal    Collection Time: 01/26/22  8:30 PM  Result Value Ref Range    Glucose-Capillary 273 (H) 70 - 99 mg/dL      Comment: Glucose reference range applies only to samples taken after fasting for at least 8 hours.  Glucose, capillary     Status: Abnormal    Collection Time: 01/27/22 12:02 AM  Result Value Ref Range    Glucose-Capillary 142 (H) 70 - 99 mg/dL      Comment: Glucose reference range applies only to samples taken after fasting for at least 8 hours.  Glucose, capillary     Status: Abnormal    Collection Time: 01/27/22  3:46 AM  Result Value Ref Range    Glucose-Capillary 136 (H) 70 - 99  mg/dL      Comment: Glucose reference range applies only to samples taken after fasting for at least 8 hours.  Comprehensive metabolic panel     Status: Abnormal    Collection Time: 01/27/22  5:30 AM  Result Value Ref Range    Sodium 139 135 - 145 mmol/L    Potassium 3.9 3.5 - 5.1 mmol/L    Chloride 101 98 - 111 mmol/L    CO2 28 22 - 32 mmol/L    Glucose, Bld 134 (H) 70 - 99 mg/dL      Comment: Glucose reference range applies only to samples taken after fasting for at least 8 hours.    BUN 53 (H) 6 - 20 mg/dL    Creatinine, Ser 1.87 (H) 0.44 - 1.00 mg/dL    Calcium 8.8 (L) 8.9 - 10.3 mg/dL    Total Protein 5.8 (L) 6.5 - 8.1 g/dL    Albumin 2.3 (L) 3.5 - 5.0 g/dL    AST 29 15 - 41 U/L    ALT 29 0 - 44 U/L    Alkaline Phosphatase 98 38 - 126 U/L    Total  Bilirubin 0.5 0.3 - 1.2 mg/dL    GFR, Estimated 35 (L) >60 mL/min      Comment: (NOTE) Calculated using the CKD-EPI Creatinine Equation (2021)      Anion gap 10 5 - 15      Comment: Performed at [facility], [place] [address] [place], [place] [address]  Magnesium     Status: Abnormal    Collection Time: 01/27/22  5:30 AM  Result Value Ref Range    Magnesium 2.7 (H) 1.7 - 2.4 mg/dL      Comment: Performed at [facility] [address] [place], [place] [address]  Phosphorus     Status: Abnormal    Collection Time: 01/27/22  5:30 AM  Result Value Ref Range    Phosphorus 5.6 (H) 2.5 - 4.6 mg/dL      Comment: Performed at [facility] [address], [place], [place] [address]  CBC with Differential/Platelet     Status: Abnormal    Collection Time: 01/27/22  5:30 AM  Result Value Ref Range    WBC 11.2 (H) 4.0 - 10.5 K/uL    RBC 2.91 (L) 3.87 - 5.11 MIL/uL    Hemoglobin 8.1 (L) 12.0 - 15.0 g/dL    HCT 24.9 (L) 36.0 - 46.0 %    MCV 85.6 80.0 - 100.0 fL    MCH 27.8 26.0 - 34.0 pg    MCHC 32.5 30.0 - 36.0 g/dL    RDW 18.4 (H) 11.5 - 15.5 %    Platelets 671 (H) 150 - 400 K/uL    nRBC 0.0 0.0 - 0.2 %    Neutrophils Relative % 73 %    Neutro Abs 8.1 (H) 1.7 - 7.7 K/uL    Lymphocytes Relative 16 %    Lymphs Abs 1.8 0.7 - 4.0 K/uL    Monocytes Relative 7 %    Monocytes Absolute 0.8 0.1 - 1.0 K/uL    Eosinophils Relative 3 %    Eosinophils Absolute 0.4 0.0 - 0.5 K/uL    Basophils Relative 0 %    Basophils Absolute 0.1 0.0 - 0.1 K/uL    Immature Granulocytes 1 %    Abs Immature Granulocytes 0.15 (H) 0.00 - 0.07 K/uL      Comment: Performed at [facility]  [address] [place], [place] [address]  Glucose, capillary     Status: Abnormal    Collection Time: 01/27/22  8:26 AM  Result Value Ref Range    Glucose-Capillary 151 (H) 70 - 99 mg/dL      Comment: Glucose reference range applies only to samples taken after fasting for at least 8 hours.  Glucose,  capillary     Status: Abnormal    Collection Time: 01/27/22 12:05 PM  Result Value Ref Range    Glucose-Capillary 203 (H) 70 - 99 mg/dL      Comment: Glucose reference range applies only to samples taken after fasting for at least 8 hours.  Glucose, capillary     Status: Abnormal    Collection Time: 01/27/22  5:58 PM  Result Value Ref Range    Glucose-Capillary 180 (H) 70 - 99 mg/dL      Comment: Glucose reference range applies only to samples taken after fasting for at least 8 hours.    Comment 1 Notify RN      Comment 2 Document in Chart    Glucose, capillary     Status: Abnormal    Collection Time: 01/27/22  8:26 PM  Result Value Ref Range    Glucose-Capillary 199 (H) 70 - 99 mg/dL      Comment: Glucose reference range applies only to samples taken after fasting for at least 8 hours.  Glucose, capillary     Status: Abnormal    Collection Time: 01/27/22 11:37 PM  Result Value Ref Range    Glucose-Capillary 237 (H) 70 - 99 mg/dL      Comment: Glucose reference range applies only to samples taken after fasting for at least 8 hours.    Comment 1 Notify RN      Comment 2 Document in Chart    Glucose, capillary     Status: Abnormal    Collection Time: 01/28/22  3:30 AM  Result Value Ref Range    Glucose-Capillary 216 (H) 70 - 99 mg/dL      Comment: Glucose reference range applies only to samples taken after fasting for at least 8 hours.    Comment 1 Notify RN      Comment 2 Document in Chart    Comprehensive metabolic panel     Status: Abnormal    Collection Time: 01/28/22  5:00 AM  Result Value Ref Range    Sodium 134 (L) 135 - 145 mmol/L    Potassium 3.9 3.5 - 5.1 mmol/L    Chloride 101 98 - 111 mmol/L    CO2 25 22 - 32 mmol/L    Glucose, Bld 211 (H) 70 - 99 mg/dL      Comment: Glucose reference range applies only to samples taken after fasting for at least 8 hours.    BUN 47 (H) 6 - 20 mg/dL    Creatinine, Ser 1.98 (H) 0.44 - 1.00 mg/dL    Calcium 8.4 (L) 8.9 - 10.3 mg/dL     Total Protein 5.9 (L) 6.5 - 8.1 g/dL    Albumin 2.4 (L) 3.5 - 5.0 g/dL    AST 33 15 - 41 U/L    ALT 31 0 - 44 U/L    Alkaline Phosphatase 100 38 - 126 U/L    Total Bilirubin 0.6 0.3 - 1.2 mg/dL    GFR, Estimated 32 (L) >60 mL/min      Comment: (NOTE) Calculated using the CKD-EPI Creatinine Equation (2021)      Anion gap 8  5 - 15      Comment: Performed at [facility], [place] [address] [place], [place] [address]  Magnesium     Status: Abnormal    Collection Time: 01/28/22  5:00 AM  Result Value Ref Range    Magnesium 2.5 (H) 1.7 - 2.4 mg/dL      Comment: Performed at [facility] [address] [place], [place] [address]  Phosphorus     Status: Abnormal    Collection Time: 01/28/22  5:00 AM  Result Value Ref Range    Phosphorus 5.2 (H) 2.5 - 4.6 mg/dL      Comment: Performed at [facility] [address], [place], [place] [address]  CBC with Differential/Platelet     Status: Abnormal    Collection Time: 01/28/22  5:00 AM  Result Value Ref Range    WBC 11.0 (H) 4.0 - 10.5 K/uL    RBC 2.94 (L) 3.87 - 5.11 MIL/uL    Hemoglobin 8.1 (L) 12.0 - 15.0 g/dL    HCT 25.5 (L) 36.0 - 46.0 %    MCV 86.7 80.0 - 100.0 fL    MCH 27.6 26.0 - 34.0 pg    MCHC 31.8 30.0 - 36.0 g/dL    RDW 17.5 (H) 11.5 - 15.5 %    Platelets 665 (H) 150 - 400 K/uL    nRBC 0.0 0.0 - 0.2 %    Neutrophils Relative % 72 %    Neutro Abs 7.9 (H) 1.7 - 7.7 K/uL    Lymphocytes Relative 18 %    Lymphs Abs 2.0 0.7 - 4.0 K/uL    Monocytes Relative 6 %    Monocytes Absolute 0.6 0.1 - 1.0 K/uL    Eosinophils Relative 2 %    Eosinophils Absolute 0.3 0.0 - 0.5 K/uL    Basophils Relative 1 %    Basophils Absolute 0.1 0.0 - 0.1 K/uL    Immature Granulocytes 1 %    Abs Immature Granulocytes 0.14 (H) 0.00 - 0.07 K/uL      Comment: Performed at [facility] [address] [place], [place] [address]  Glucose, capillary     Status: Abnormal    Collection Time: 01/28/22  7:46 AM  Result Value Ref  Range    Glucose-Capillary 125 (H) 70 - 99 mg/dL      Comment: Glucose reference range applies only to samples taken after fasting for at least 8 hours.      Imaging Results (Last 48 hours)  No results found.         Blood pressure (!) 132/91, pulse 95, temperature 98.5 F (36.9 C), temperature source Oral, resp. rate 18, height _0  (1.651 m), weight 90.7 kg, last menstrual period 11/01/2021, SpO2 99 %.   Medical Problem List and Plan: 1. 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             -patient may shower with lines and foot wound covered             -ELOS/Goals: 14-21 days 2.  Antithrombotics: -DVT/anticoagulation:  Pharmaceutical: Lovenox             -antiplatelet therapy: Brilinta 3. Pain Management: Tylenol, oxycodone as needed 4. Mood/Behavior/Sleep: LCSW to evaluate and provide support             -continue melatonin 3  mg q HS             -antipsychotic agents: n/a 5. Neuropsych/cognition: This patient is capable of making decisions on her own behalf. 6. 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 Fluids/Electrolytes/Nutrition: Strict Is and Os and follow-up chemistries             -dys 3/thin liquids; SLP eval             -repeat mag/phos tomorrow 8: Right ICA dissection with stent placement: on Brilinta 9: Hypertension: monitor             -continue hydralazine 12.5 mg TID             -continue isosorbide 20 mg BID             -continue Lopressor 37.5 mg BID 10: Hyperlipidemia: continue Lipitor 40 mg daily 11: Coagulopathy s/p RUE embolectomy 10/2021: Eliquis PTA now on Lovenox; f                          -follow-up hematology as outpt 12: COPD exacerbation: hypoxia resolved; continue bronchodilators/nebs 13: Chronic diastolic and  systolic heart failure:             -strict Is and Os and daily weight There were no vitals filed for this visit.              -no diuretics currently             -see meds above 14: Hypothyroidism: continue Synthroid 125 mcg daily; follow-up TSH 15: Acute on chonic anemia: s/p 1 un PCs; follow-up CBC 16: DM: CBGs q AC and q HS (A1c = 8%)             -continue Levemir 25 units BID             -continue SSI q AC 17: Class 2 obesity: dietary counseling 18: Tobacco abuse: cessation counseling; continue nicotine patch 19: AKI atop CKD IV: trend BMP 20: CAD: stable; treated medically; Dr. Harl Bowie 21: Left foot osteomyelitis s/p ray amputation [name] -recent bone biopsy; cultures neg - Husband to bring in CAM boot for weightbearing, protection 22: Leukocytosis: trending down; follow-up CBC   Barbie Banner, PA-C 01/28/2022   I have examined the patient independently and edited the note for HPI, ROS, exam, assessment, and plan as appropriate. I am in agreement with the above recommendations.   Gertie Gowda, DO 01/28/2022

## 2022-01-28 NOTE — Progress Notes (Signed)
ANTICOAGULATION CONSULT NOTE - Initial Consult  Pharmacy Consult for lovenox Indication:  hypercoaguable state (failed Eliquis)  Allergies  Allergen Reactions   Wellbutrin [Bupropion] Hives   Cefepime Rash    Tolerates penicilllin   Ciprofloxacin Hcl Hives and Rash    Hives/rash at injection site; 01/15/22 tolerated IV cipro   Tape Rash    Vital Signs: Temp: 98.2 F (36.8 C) (09/28 1103) Temp Source: Oral (09/28 1103) BP: 119/79 (09/28 1103) Pulse Rate: 95 (09/28 1103)  Labs: Recent Labs    01/26/22 0555 01/26/22 1414 01/27/22 0530 01/28/22 0500  HGB 6.5* 8.3* 8.1* 8.1*  HCT 20.4* 24.9* 24.9* 25.5*  PLT 648*  --  671* 665*  CREATININE 2.05*  --  1.87* 1.98*    Estimated Creatinine Clearance: 42.5 mL/min (A) (by C-G formula based on SCr of 1.98 mg/dL (H)).   Medical History: Past Medical History:  Diagnosis Date   Anemia    CAD (coronary artery disease)    a. s/p cath in 03/2014 showing 30% mid-LAD, moderate to severe disease along small D1, patent LCx, moderate to severe distal OM2 stenosis and moderate diffuse diease along RCA not amenable to PCI   CHF (congestive heart failure) ([place])    a. EF 55-60% in 12/2019 b. EF at 35-40% by echo in 05/2020   CKD (chronic kidney disease) stage 4, GFR 15-29 ml/min (HCC)    Diabetes mellitus without complication (HCC)    Myocardial infarction ([place])    Neuropathy    Stroke (HCC)     Medications:  Medications Prior to Admission  Medication Sig Dispense Refill Last Dose   apixaban (ELIQUIS) 5 MG TABS tablet Take 5 mg by mouth 2 (two) times daily.   01/07/2022 at unk   enoxaparin (LOVENOX) 80 MG/0.8ML injection Inject 0.8 mLs (80 mg total) into the skin daily. 0 mL 0 unk   arformoterol (BROVANA) 15 MCG/2ML NEBU Take 2 mLs (15 mcg total) by nebulization 2 (two) times daily. 120 mL 0 unk   atorvastatin (LIPITOR) 40 MG tablet TAKE 1 TABLET BY MOUTH ONCE DAILY. (Patient taking differently: Take 40 mg by mouth daily.) 90 tablet 1  01/07/2022   budesonide (PULMICORT) 0.5 MG/2ML nebulizer solution Take 2 mLs (0.5 mg total) by nebulization 2 (two) times daily.  12 unk   Continuous Blood Gluc Sensor (DEXCOM G6 SENSOR) MISC Change sensor every 10 days as directed 9 each 3    Continuous Blood Gluc Transmit (DEXCOM G6 TRANSMITTER) MISC Change transmitter every 90 days as directed. 1 each 3    hydrALAZINE (APRESOLINE) 25 MG tablet Take 0.5 tablets (12.5 mg total) by mouth every 8 (eight) hours. 45 tablet 0 01/07/2022   insulin aspart (NOVOLOG) 100 UNIT/ML injection Inject 0-20 Units into the skin every 4 (four) hours. 10 mL 11 unk   insulin detemir (LEVEMIR) 100 UNIT/ML injection Inject 0.25 mLs (25 Units total) into the skin 2 (two) times daily. 10 mL 11 unk   Insulin Disposable Pump (OMNIPOD 5 G6 INTRO, GEN 5,) KIT Change pod every 48-72 hrs 1 kit 0    Insulin Disposable Pump (OMNIPOD 5 G6 POD, GEN 5,) MISC Change pod every 48-72 hours 25 each 6    isosorbide mononitrate (ISMO) 20 MG tablet Take 1 tablet (20 mg total) by mouth 2 (two) times daily. 60 tablet 0 unk   Levothyroxine Sodium 200 MCG CAPS Take 200 mcg by mouth daily before breakfast.   01/07/2022   Metoprolol Tartrate 37.5 MG TABS Take 37.5 mg  by mouth 2 (two) times daily. 60 tablet 0 01/07/2022   nitroGLYCERIN (NITROSTAT) 0.4 MG SL tablet Place 1 tablet (0.4 mg total) under the tongue every 5 (five) minutes x 3 doses as needed for chest pain. 25 tablet 3 never used   pantoprazole (PROTONIX) 40 MG tablet Take 1 tablet (40 mg total) by mouth daily as needed for acid reflux (Patient taking differently: Take 40 mg by mouth daily as needed (heartburn).) 90 tablet 2 01/07/2022   [START ON 01/29/2022] revefenacin (YUPELRI) 175 MCG/3ML nebulizer solution Take 3 mLs (175 mcg total) by nebulization daily. 90 mL  unk   ticagrelor (BRILINTA) 90 MG TABS tablet Take 1 tablet (90 mg total) by mouth 2 (two) times daily. 60 tablet 0 unk    Assessment: 52 YOF has a work-up for a hypercoaguable  state. Differential includes APLS, vasculitis, or other autoimmune diseases that can contribute to a hypercoaguable state. Patient was maintained on lovenox which was transitioned to a heparin infusion prior to transferring to CIR for rehab services. A consult is received to convert her to therapeutic lovenox per hematology recommendations.   Goal of Therapy:  Monitor platelets by anticoagulation protocol: Yes   Plan:  Lovenox 80 mg Ker[place] qday. She was on this dose prior to transitioning to heparin. This dose was based on LMWH levels at steady-state Monitor for signs of bleeding LMWH level at steady state  Thank you for the consult  Vaughan Basta BS, PharmD, BCPS Clinical Pharmacist 01/28/2022 4:09 PM  Contact: [phone] after 3 PM  "Be curious, not judgmental..." -Jamal Maes

## 2022-01-28 NOTE — Progress Notes (Signed)
Inpatient Rehabilitation Admissions Coordinator   I have CIR bed to admit her to today. Dr Doristine Bosworth, Daughter, Acute team and [facility] made aware. I will make the arrangements to admit today.  Danne Baxter, RN, MSN Rehab Admissions Coordinator [phone] 01/28/2022 10:25 AM

## 2022-01-28 NOTE — Progress Notes (Signed)
Inpatient Rehabilitation Admission Medication Review by a Pharmacist  A complete drug regimen review was completed for this patient to identify any potential clinically significant medication issues.  High Risk Drug Classes Is patient taking? Indication by Medication  Antipsychotic Yes Compazine- N/V  Anticoagulant Yes Lovenox- hypercoaguable state  Antibiotic No   Opioid Yes OxyIR- acute pain  Antiplatelet Yes Brilinta- CVA ppx  Hypoglycemics/insulin Yes Insulin- T2DM  Vasoactive Medication Yes Hydralazine, Ismo, Lopressor- HTN  Chemotherapy No   Other Yes Trazodone- sleep Robaxin- muscle relaxant Synthroid- hypothyroidism Melatonin- sleep Lipitor- HLD     Type of Medication Issue Identified Description of Issue Recommendation(s)  Drug Interaction(s) (clinically significant)     Duplicate Therapy     Allergy     No Medication Administration End Date     Incorrect Dose     Additional Drug Therapy Needed     Significant med changes from prior encounter (inform family/care partners about these prior to discharge).    Other       Clinically significant medication issues were identified that warrant physician communication and completion of prescribed/recommended actions by midnight of the next day:  No   Time spent performing this drug regimen review (minutes):  30   Barbara Keng BS, PharmD, BCPS Clinical Pharmacist 01/28/2022 4:49 PM  Contact: [phone] after 3 PM  "Be curious, not judgmental..." -Jamal Maes

## 2022-01-28 NOTE — Progress Notes (Signed)
Nutrition Follow-up  DOCUMENTATION CODES:  Not applicable  INTERVENTION:  Continue current diet per SLP Encourage PO intake MVI with minerals daily Consider Glucerna Shake po TID, each supplement provides 220 kcal and 10 grams of protein if intake inadequate to meet estimated needs  NUTRITION DIAGNOSIS:  Inadequate oral intake related to dysphagia as evidenced by meal completion < 50%. - progressing  GOAL:  Patient will meet greater than or equal to 90% of their needs - progressing, diet advanced  MONITOR:  PO intake, I & O's, Labs, Supplement acceptance  REASON FOR ASSESSMENT:  Consult Enteral/tube feeding initiation and management  ASSESSMENT:  Pt with PMH of CAD with MI, CHF with preserved EF, HTN, uncontrolled DM, strokes, R arm ischemia, CKD stage IV, PVD, L foot DM ulcer s/p amputation of 5th toe 08/2021 with concern of osteomyelitis of 4th toe admitted with acute R MCA stroke s/p thrombectomy, R ICA dissection repair, and stent placement.  9/15 re-intubated d/t pulmonary edema 9/15 s/p L fourth metatarsal bone biopsy, results pending  9/18 extubated 9/26 - diet advanced to DYS3/thins after SLP evaluation 9/27 - cortrak removed  Admit weight: 91 kg Current weight: 84.6 kg  Pt being discharged this afternoon to CIR. Pt expressed to MD that she would not want a PEG in the future if she were unable to meet needs orally. Palliative care met with family and discussed artifical feeds and pt requested that cortrak tube be removed, which is appropriate as pt did not want long term nutrition. Family has been bringing pt some meals from outside the hospital and modifying to make it appropriate for pt.   Nutritionally Relevant Medications: Scheduled Meds:  atorvastatin  40 mg Per Tube Daily   insulin aspart  0-20 Units Subcutaneous Q4H   insulin detemir  25 Units Subcutaneous BID   pantoprazole  40 mg Oral Daily   Labs Reviewed  Diet Order:   Diet Order             DIET  DYS 3 Room service appropriate? Yes; Fluid consistency: Thin  Diet effective now                   EDUCATION NEEDS:  Education needs have been addressed  Skin:  Skin Assessment: Reviewed RN Assessment  Last BM:  9/27 - type 6  Height:  Ht Readings from Last 1 Encounters:  01/13/22 _0  (1.651 m)    Weight:  Wt Readings from Last 1 Encounters:  01/28/22 90.7 kg   BMI:  Body mass index is 33.27 kg/m.  Estimated Nutritional Needs:  Kcal:  2000-2200 Protein:  105-120g Fluid:  2 L/day  Ranell Patrick, RD, LDN Clinical Dietitian RD pager # available in [place]  After hours/weekend pager # available in [facility]

## 2022-01-28 NOTE — Progress Notes (Signed)
Pt still in hospital

## 2022-01-28 NOTE — Progress Notes (Signed)
PROGRESS NOTE    Lauren [name]  IOX:[id number] DOB: 08-30-1980 DOA: 01/08/2022 PCP: Lemmie Evens, MD   Brief Narrative:  41 yr old female with a history of CAD, chronic diastolic heart failure, diabetes mellitus type 2 with peripheral neuropathy with recent arterial thrombectomy to the right upper extremity due to limb ischemia, CKD stage IIIa, peripheral arterial disease was admitted with right MCA CVA and V3 segment of the right vertebral artery stenosis with left ICA stenosis.  She was initially admitted under PCCM service, extubated on 01/18/2022 and transferred to [facility] on 01/21/2022.   Significant Events: 9/8 Admitted with left sided weakness, facial droop.  NIHSS 8, to IR  for thrombectomy, intubated.  MRSA PCR +, Rhinovirus + 9/10 PSV wean, moving RUE/RLE, extubated  9/11 ECHO>, CT head negative for acute changes, Trach asp negative,  9/13 started on HFNC 10L overnight 9/14 tmax 101.2, remains on HFNC, MRI, Heme/onc and ID consult 9/18 extubated, infectious disease change antimicrobial coverage to cefadroxil and Flagyl 9/19: Required Precedex overnight for restlessness.  Off by a.m. rounds   Significant studies: CT head Periventricular white matter hypodensities adjacent to the frontal horn of the right lateral ventricles and in the right corona radiata are new since prior exam. This may represent acute ischemia. CTA head & neck - Age indeterminate occlusion of the proximal right ICA in the neck with non opacification distally in the upper neck and intracranially. Right MCA and A1 ACA are patent, but diminutive. Severe left paraclinoid ICA stenosis. CT perfusion, no evidence of core infarct or penumbra; however, there is approximately 38 mL of T-max greater than 4 seconds in the right MCA territory which suggests possible oligemia. IR with TICI3 of intracranial R ICA, however complicated by ICA dissection status post telescope stents Post IR CT - Redemonstrated hypodensity in the  right frontal WM MRI x 2 Extensive patchy areas of acute infarction involving the right MCA territory, most confluent areas along the watershed. 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)   Hypothyroidism   CAD (coronary artery disease)   Acute renal failure superimposed on stage 4 chronic kidney disease (HCC)   Tobacco abuse   Class 2 obesity due to excess calories without serious comorbidity with body mass index (BMI) of 39.0 to 39.9 in adult   Acute osteomyelitis of left foot ([place])   Internal carotid artery occlusion, right   Acute respiratory failure with hypoxia (HCC)   HFrEF (heart failure with reduced ejection fraction) (HCC)   Elevated troponin  Acute ischemic stroke of the right MCA and punctuated left MCA/ACA infarct with right ICA occlusion, s/p repair with TICI, 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  -Etiology unclear likely large vessel disease, cannot rule out cardioembolic source -Initially admitted to ICU under PCCM service, had to be intubated -2D echo showed EF of 30% with LDL 39, A1c 8.0 -Hypercoagulable work-up showed abnormal lupus anticoagulant but was also on heparin, slightly increased rheumatoid factor -Plan to repeat labs in 3 months and follow-up hematology as outpatient; hematology has been notified -She was previously on Eliquis and now switched to Lovenox per oncology recommendation, she should be on Lovenox therapeutically.  Patient is also on Brilinta. -Plan to go to inpatient rehab.  Dysphagia: Patient unable  to safely swallow.  She is on tube feedings.  SLP following.  Patient chose to be DNR.  She is on dysphagia diet to but also getting tube feedings.  Has not been seen by SLP for few  days, will reconsult them.  She has clearly declined PEG tube placement even if needed.  Her sister at the bedside also concurs.  We we will wait for palliative care to have further discussions with the patient.   History of limb ischemia/hypercoagulability - recent h/o  right upper extremity ischemia -S/p thrombectomy -Patient was on Eliquis PTA -Switch to Lovenox per oncology recommendation.  -She will need hematology follow-up as outpatient   Acute respiratory failure with hypoxia/COPD exacerbation/rhinovirus -Resolved -She was initially intubated and admitted under PCCM service, now extubated -She was started on IV Lasix which has been discontinued -Chest x-ray showed right infrahilar opacity which has improved, procalcitonin high at 1.18 -She is currently not requiring oxygen, O2 sats 98% on room air -Continue Brovana, Pulmicort   Acute kidney injury on CKD stage IV -Early contrast-induced nephropathy, creatinine peaked at 5 -Creatinine is down to 2.6 and has remained stable since 01/22/2022. -Lasix is currently on hold -Follow BMP in am   Chronic diastolic and systolic heart failure:  -2D echo showed EF of 74%, grade 2 diastolic dysfunction;With wall motion abnormality to the LAD distribution question obstruction versus stress-induced cardiomyopathy. -Euvolemic -Cardiology was consulted and relates this may be from demand ischemia from CHF, she will need ischemic evaluation as outpatient -She was started on Imdur, metoprolol, statin; continue on Brilinta -No ACE inhibitor/ARB due to renal dysfunction  Acute on chronic anemia: Hemoglobin dropped to 6.9 on 01/23/2022, received 1 unit of PRBC transfusion.  Hemoglobin over 7.4 on 01/24/2022 but drifting down and dropped to 6.5 on 01/26/2022 for which she received 1 unit of point RBC transfusion, Lovenox was held.  Hypertension -Patient is currently on metoprolol, Imdur -Blood pressure well controlled   Diabetes mellitus type 1/DKA on  admission -DKA has resolved.  Continue Semglee 25 units twice daily and SSI.  Blood sugar controlled.   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 Recommended to continue cefadroxil and Flagyl, andstop antibiotics if culture is negative. Abx have been stopped. -will require ID follow-up as an outpatient. ID was consulted ; culture from bone biopsy performed on 01/16/2022 negative till date.   Leukocytosis Discussed with ID regarding persistent leukocytosis -Does not seem to be infectious She was on steroids till 9/16, may be reactive.  Follow CBC.   Class 2 obesity due to excess calories without serious comorbidity with body mass index (BMI) of 39.0 to 39.9  in adult: Noted.   Tobacco abuse: Current smoker she has been counseled, nicotine patch was placed.  DVT prophylaxis: SCDs Start: 01/08/22 1926   Code Status: DNR  Family Communication: None present at bedside.   Status is: Inpatient Remains inpatient appropriate because: Pending placement to CIR.   Estimated body mass index is 33.27 kg/m as calculated from the following:   Height as of this encounter: 5\' 5"  (1.651 m).   Weight as of this encounter: 90.7 kg.    Nutritional Assessment: Body mass index is 33.27 kg/m.Marland Kitchen Seen by dietician.  I agree with the assessment and plan as outlined below:  Nutrition Status: Nutrition Problem: Inadequate oral intake Etiology: inability to eat Signs/Symptoms: NPO status Interventions: Tube feeding, Prostat  . Skin Assessment: I have examined the patient's skin and I agree with the wound assessment as performed by the wound care RN as outlined below:    Consultants:   Hematology Infectious disease Podiatry Nephrology Cardiology  Procedures:  As above  Antimicrobials:  Anti-infectives (From admission, onward)    Start     Dose/Rate Route Frequency Ordered Stop   01/18/22 1330  cefadroxil (DURICEF) capsule 500 mg  Status:  Discontinued        500 mg Per Tube 2 times daily 01/18/22 1231 01/21/22 0759   01/18/22 1330  metroNIDAZOLE (FLAGYL) tablet 500 mg  Status:  Discontinued        500 mg Per Tube Every 12 hours 01/18/22 1231 01/21/22 0759   01/16/22 1700  ciprofloxacin (CIPRO) IVPB 400 mg  Status:  Discontinued        400 mg 200 mL/hr over 60 Minutes Intravenous Every 24 hours 01/16/22 1015 01/18/22 1231   01/16/22 1200  vancomycin (VANCOREADY) IVPB 500 mg/100 mL  Status:  Discontinued        500 mg 100 mL/hr over 60 Minutes Intravenous Every 24 hours 01/16/22 1018 01/18/22 1231   01/16/22 0400  ciprofloxacin (CIPRO) IVPB 200 mg  Status:  Discontinued        200 mg 100 mL/hr over 60 Minutes Intravenous Every 12 hours 01/15/22 1244 01/16/22 1015   01/15/22 1330  vancomycin (VANCOREADY) IVPB 1750 mg/350 mL        1,750 mg 175 mL/hr over 120 Minutes Intravenous  Once 01/15/22 1240 01/15/22 1600   01/15/22 1330  ciprofloxacin (CIPRO) IVPB 200 mg        200 mg 100 mL/hr over 60 Minutes Intravenous  Once 01/15/22 1244 01/15/22 1722   01/15/22 1249  vancomycin variable dose per unstable renal function (pharmacist dosing)  Status:  Discontinued         Does not apply See admin instructions 01/15/22 1249 01/16/22 1019   01/09/22 2245  amoxicillin-clavulanate (AUGMENTIN) 500-125 MG per tablet 500 mg  Status:  Discontinued        1 tablet Per Tube Every 12 hours 01/09/22 2155 01/15/22 0912   01/09/22 2200  amoxicillin-clavulanate (AUGMENTIN) 875-125 MG per tablet 1 tablet  Status:  Discontinued        1 tablet Per Tube Every 12 hours 01/09/22 1055 01/09/22 1630   01/09/22 2200  amoxicillin-clavulanate (AUGMENTIN) 500-125 MG per tablet 500 mg  Status:   Discontinued        1 tablet Oral Every 12 hours 01/09/22 1630 01/09/22 2155   01/09/22 1030  amoxicillin-clavulanate (AUGMENTIN) 875-125 MG per tablet 1 tablet  Status:  Discontinued        1 tablet Oral Every 12 hours 01/09/22 0935 01/09/22 1055   01/08/22 1425  ceFAZolin (ANCEF) 2-4 GM/100ML-% IVPB       Note to Pharmacy: Wandalee Ferdinand J: cabinet override      01/08/22 1425 01/08/22 2035         Subjective:  Seen and examined.  Family members at bedside.  Patient has no new complaint.  Objective: Vitals:   01/28/22 0331 01/28/22 0500 01/28/22 0725 01/28/22 0746  BP: (!) 132/91     Pulse: 95     Resp: 18     Temp: 98.4 F (36.9 C)  98.5 F (36.9 C)   TempSrc: Oral  Oral  SpO2: 100%   99%  Weight:  90.7 kg    Height:        Intake/Output Summary (Last 24 hours) at 01/28/2022 0804 Last data filed at 01/27/2022 1240 Gross per 24 hour  Intake --  Output 800 ml  Net -800 ml    Filed Weights   01/25/22 0500 01/27/22 0500 01/28/22 0500  Weight: 83.5 kg 84.6 kg 90.7 kg    Examination:   General exam: Appears calm and comfortable  Respiratory system: Clear to auscultation. Respiratory effort normal. Cardiovascular system: S1 & S2 heard, RRR. No JVD, murmurs, rubs, gallops or clicks. No pedal edema. Gastrointestinal system: Abdomen is nondistended, soft and nontender. No organomegaly or masses felt. Normal bowel sounds heard. Central nervous system: Alert and oriented.  Left hemiparesis, left facial droop, dysarthria. Skin: No rashes, lesions or ulcers.  Psychiatry: Judgement and insight appear normal. Mood & affect appropriate.    Data Reviewed: I have personally reviewed following labs and imaging studies  CBC: Recent Labs  Lab 01/24/22 0336 01/25/22 0550 01/26/22 0555 01/26/22 1414 01/27/22 0530 01/28/22 0500  WBC 18.9* 19.0* 12.0*  --  11.2* 11.0*  NEUTROABS 13.2* 17.7* 8.3*  --  8.1* 7.9*  HGB 7.4* 7.0* 6.5* 8.3* 8.1* 8.1*  HCT 22.7* 22.0* 20.4*  24.9* 24.9* 25.5*  MCV 83.8 84.3 84.3  --  85.6 86.7  PLT 734* 736* 648*  --  671* 665*    Basic Metabolic Panel: Recent Labs  Lab 01/24/22 0336 01/25/22 0550 01/26/22 0555 01/27/22 0530 01/28/22 0500  NA 139 134* 136 139 134*  K 4.5 4.3 4.0 3.9 3.9  CL 102 99 102 101 101  CO2 25 25 28 28 25   GLUCOSE 135* 290* 119* 134* 211*  BUN 91* 77* 64* 53* 47*  CREATININE 2.69* 2.34* 2.05* 1.87* 1.98*  CALCIUM 8.7* 8.4* 8.4* 8.8* 8.4*  MG 3.2* 3.0* 2.8* 2.7* 2.5*  PHOS 7.3* 6.5* 5.9* 5.6* 5.2*    GFR: Estimated Creatinine Clearance: 42.5 mL/min (A) (by C-G formula based on SCr of 1.98 mg/dL (H)). Liver Function Tests: Recent Labs  Lab 01/24/22 0336 01/25/22 0550 01/26/22 0555 01/27/22 0530 01/28/22 0500  AST 37 39 31 29 33  ALT 31 33 29 29 31   ALKPHOS 93 110 98 98 100  BILITOT 0.6 0.5 0.7 0.5 0.6  PROT 6.0* 6.1* 5.8* 5.8* 5.9*  ALBUMIN 2.3* 2.4* 2.3* 2.3* 2.4*    No results for input(s): "LIPASE", "AMYLASE" in the last 168 hours. No results for input(s): "AMMONIA" in the last 168 hours. Coagulation Profile: No results for input(s): "INR", "PROTIME" in the last 168 hours. Cardiac Enzymes: No results for input(s): "CKTOTAL", "CKMB", "CKMBINDEX", "TROPONINI" in the last 168 hours. BNP (last 3 results) No results for input(s): "PROBNP" in the last 8760 hours. HbA1C: No results for input(s): "HGBA1C" in the last 72 hours. CBG: Recent Labs  Lab 01/27/22 1758 01/27/22 2026 01/27/22 2337 01/28/22 0330 01/28/22 0746  GLUCAP 180* 199* 237* 216* 125*    Lipid Profile: No results for input(s): "CHOL", "HDL", "LDLCALC", "TRIG", "CHOLHDL", "LDLDIRECT" in the last 72 hours. Thyroid Function Tests: No results for input(s): "TSH", "T4TOTAL", "FREET4", "T3FREE", "THYROIDAB" in the last 72 hours. Anemia Panel: No results for input(s): "VITAMINB12", "FOLATE", "FERRITIN", "TIBC", "IRON", "RETICCTPCT" in the last 72 hours. Sepsis Labs: Recent Labs  Lab 01/21/22 0830   PROCALCITON 1.18     No results found for this or any previous visit (from the past 240 hour(s)).  Radiology Studies: No results found.  Scheduled Meds:  acetaminophen  500 mg Oral TID   arformoterol  15 mcg Nebulization BID   atorvastatin  40 mg Per Tube Daily   budesonide (PULMICORT) nebulizer solution  0.5 mg Nebulization BID   Chlorhexidine Gluconate Cloth  6 each Topical Daily   hydrALAZINE  12.5 mg Oral Q8H   insulin aspart  0-20 Units Subcutaneous Q4H   insulin detemir  25 Units Subcutaneous BID   isosorbide mononitrate  20 mg Oral BID   levothyroxine  75 mcg Oral Q0600   melatonin  3 mg Oral QHS   metoprolol tartrate  37.5 mg Oral BID   nicotine  21 mg Transdermal Daily   pantoprazole  40 mg Oral Daily   revefenacin  175 mcg Nebulization Daily   ticagrelor  90 mg Oral BID   Or   ticagrelor  90 mg Per Tube BID   Continuous Infusions:     LOS: 20 days   Darliss Cheney, MD [facility]  01/28/2022, 8:04 AM   *Please note that this is a verbal dictation therefore any spelling or grammatical errors are due to the "[employer]" system interpretation.  Please page via [place] and do not message via secure chat for urgent patient care matters. Secure chat can be used for non urgent patient care matters.  How to contact the [facility] Attending or Consulting provider [place] or covering provider during after hours [place], for this patient?  Check the care team in [facility] and look for a) attending/consulting [facility] provider listed and b) the [facility] team listed. Page or secure chat [phone]. Log into [email or web address] and use [place]'s universal password to access. If you do not have the password, please contact the hospital operator. Locate the [facility] provider you are looking for under [facility] and page to a number that you can be directly reached. If you still have difficulty reaching the provider, please page the [facility] (Director on Call) for the Hospitalists listed on  amion for assistance.

## 2022-01-28 NOTE — Progress Notes (Signed)
Gertie Gowda, DO  Physician Physical Medicine and Rehabilitation PMR Pre-admission    Signed Date of Service:  01/12/2022  1:06 PM  Related encounter: ED to Hosp-Admission (Discharged) from 01/08/2022 in [facility]   Signed      PMR Admission Coordinator Pre-Admission Assessment   Patient: Lauren [name] is an 41 y.o., female MRN: [id number] DOB: 1980/09/08 Height: _0  (165.1 cm) Weight: 90.7 kg   Insurance Information HMO:     PPO:      PCP:      IPA:      80/20:      OTHER:  PRIMARY: [employer] Focus Plan      Policy#: [id number]   Group # [id number]   Subscriber: Patient CM Name: Chastity      Phone#: [phone]     Fax#: [phone] Pre-Cert#: [id number]  approved for 7 days from admit    Employer: [employer] [place]/ [employer] Benefits:  Phone #: [phone]     Name: 9/19 Eff. Date: 06/03/21     Deduct: none      Out of Pocket Max: $2500      Life Max: none CIR: $1150 co pay per admission      SNF: 80% 120 days per year Outpatient: 80%     Co-Pay: visits per medical neccesity Home Health: 80%      Co-Pay: visit per medical necessity DME: 80%     Co-Pay: 20% Providers: in network  SECONDARY: none       Financial Counselor:       Phone#:    [employer]" for patients in Inpatient Rehabilitation Facilities with attached "Privacy Act [place] Records" was provided and verbally reviewed with: N/A   Emergency Contact Information Contact Information       Name Relation Home Work Mobile    [place] Spouse [phone]   [place]     [place] Father     [phone]    Chena, Chohan Daughter     [phone]         Current Medical History  Patient Admitting Diagnosis: CVA   History of Present Illness: 41 year old female presented to [facility] 01/08/22 with left sided facial droop, left sided weakness, and slurred speech. Imaging revealed: age  indeterminate occlusion of proximal right ICA, V3 segment of right vertebral artery, left para clinoid ICA stenosis. No core infarct or penumbra. Patient went immediately to IR for thrombectomy and noted R ICA dissection requiring repair.   Of note, patient with recent arterial thrombectomy of the right upper extremity due to limb ischemia. She has had URI x 2 days with recent sick contact. COVID negative but Rhinovirus positive. Extubated 9/10 on BiPAP initially .   On 9/13 HFNC and then febrile. Re intubated on 9/15 with eventual extubation on 9/18. ID changed antimicrobial coverage to cefadroxil and flagyl. Required Precedex for restlessness in ICU. Pulmonary felt respiratory distress is more so due to volume. Treated with IV lasix, Maretta Bees, Brovana and pulmicort nebulizer and aspiration precautions. Continue to titrate and wean Oxygen. Smoking cessation counseling.    Echo on 9/14 with EF 35 to 40 % and grade II diastolic dysfunction. Cardiology consulted and felt likely to need ischemic eval as outpatient. Continue isosorbide mononitrate, metoprolol . Atorvastatin and brilinta. No ACEi/ARB/ARNIU/MRA/SGLT2 given renal funtion. Anemia felt secondary to anemia of chronic kidney disease. Transfused this admit and to continue to  monitor. Type 1 DM on Levemir and SSI. Synthroid for hypothyroidism.    Left foot osteomyelitis and diabetic ulcer. Podiatry consulted with no acute OR plans. Cefadroxil and metronidazole per ID. Nutrition per cortrak feeds with SLP to follow.   On eliquis prior to admit. Continue Statin and brilinta. Neuro recommended repeat hyper coag workup in 3 months.    Complete NIHSS TOTAL: 16   Patient's medical record from [facility] has been reviewed by the rehabilitation admission coordinator and physician.   Past Medical History      Past Medical History:  Diagnosis Date   Anemia     CAD (coronary artery disease)      a. s/p cath in 03/2014 showing 30% mid-LAD, moderate to  severe disease along small D1, patent LCx, moderate to severe distal OM2 stenosis and moderate diffuse diease along RCA not amenable to PCI   CHF (congestive heart failure) ([place])      a. EF 55-60% in 12/2019 b. EF at 35-40% by echo in 05/2020   CKD (chronic kidney disease) stage 4, GFR 15-29 ml/min (HCC)     Diabetes mellitus without complication ([place])     Myocardial infarction ([place])     Neuropathy     Stroke ([place])      Has the patient had major surgery during 100 days prior to admission? no   Family History   family history includes Breast cancer (age of onset: 35) in her paternal grandmother; CAD in her father and mother; CVA in her mother; Cancer in her paternal grandmother; Hyperlipidemia in her father and mother; Hypertension in her father and mother; Thyroid disease in her mother.   Current Medications   Current Facility-Administered Medications:    acetaminophen (TYLENOL) tablet 650 mg, 650 mg, Oral, Q4H PRN, 650 mg at 01/27/22 1808 **OR** acetaminophen (TYLENOL) 160 MG/5ML solution 650 mg, 650 mg, Per Tube, Q4H PRN, 650 mg at 01/25/22 0422 **OR** acetaminophen (TYLENOL) suppository 650 mg, 650 mg, Rectal, Q4H PRN, Deveshwar, Sanjeev, MD   acetaminophen (TYLENOL) tablet 500 mg, 500 mg, Oral, TID, Pahwani, Ravi, MD, 500 mg at 01/28/22 0832   arformoterol (BROVANA) nebulizer solution 15 mcg, 15 mcg, Nebulization, BID, Ollis, Brandi L, NP, 15 mcg at 01/28/22 0744   atorvastatin (LIPITOR) tablet 40 mg, 40 mg, Per Tube, Daily, Candee Furbish, MD, 40 mg at 01/27/22 0850   budesonide (PULMICORT) nebulizer solution 0.5 mg, 0.5 mg, Nebulization, BID, Ollis, Brandi L, NP, 0.5 mg at 01/28/22 0750   Chlorhexidine Gluconate Cloth 2 % PADS 6 each, 6 each, Topical, Daily, Mannam, Praveen, MD, 6 each at 01/27/22 1240   docusate sodium (COLACE) capsule 100 mg, 100 mg, Oral, BID PRN, Darliss Cheney, MD   hydrALAZINE (APRESOLINE) tablet 12.5 mg, 12.5 mg, Oral, Q8H, Pahwani, Ravi, MD, 12.5 mg at 01/28/22  0507   influenza vac split quadrivalent PF (FLUARIX) injection 0.5 mL, 0.5 mL, Intramuscular, Prior to discharge, Candee Furbish, MD   insulin aspart (novoLOG) injection 0-20 Units, 0-20 Units, Subcutaneous, Q4H, Icard, Bradley L, DO, 3 Units at 01/28/22 0831   insulin detemir (LEVEMIR) injection 25 Units, 25 Units, Subcutaneous, BID, Pahwani, Ravi, MD, 25 Units at 01/27/22 2143   ipratropium-albuterol (DUONEB) 0.5-2.5 (3) MG/3ML nebulizer solution 3 mL, 3 mL, Nebulization, Q4H PRN, Mannam, Praveen, MD, 3 mL at 01/14/22 0257   isosorbide mononitrate (ISMO) tablet 20 mg, 20 mg, Oral, BID, Pahwani, Ravi, MD   levothyroxine (SYNTHROID) tablet 75 mcg, 75 mcg, Oral, Q0600, Darliss Cheney, MD, 75  mcg at 01/28/22 0508   melatonin tablet 3 mg, 3 mg, Oral, QHS, Pahwani, Ravi, MD   metoprolol tartrate (LOPRESSOR) injection 5 mg, 5 mg, Intravenous, Q8H PRN, Allie Bossier, MD   metoprolol tartrate (LOPRESSOR) tablet 37.5 mg, 37.5 mg, Oral, BID, Pahwani, Ravi, MD   nicotine (NICODERM CQ - dosed in mg/24 hours) patch 21 mg, 21 mg, Transdermal, Daily, Ollis, Brandi L, NP, 21 mg at 01/27/22 0854   nitroGLYCERIN (NITROSTAT) SL tablet 0.4 mg, 0.4 mg, Sublingual, Q5 min PRN, Allie Bossier, MD, 0.4 mg at 01/23/22 1357   Oral care mouth rinse, 15 mL, Mouth Rinse, PRN, Elsie Lincoln, MD, 15 mL at 01/10/22 2034   oxyCODONE (Oxy IR/ROXICODONE) immediate release tablet 5 mg, 5 mg, Oral, Q6H PRN, Darliss Cheney, MD, 5 mg at 01/27/22 1809   pantoprazole (PROTONIX) EC tablet 40 mg, 40 mg, Oral, Daily, Pahwani, Ravi, MD   pneumococcal 20-valent conjugate vaccine (PREVNAR 20) injection 0.5 mL, 0.5 mL, Intramuscular, Prior to discharge, Candee Furbish, MD   polyethylene glycol (MIRALAX / GLYCOLAX) packet 17 g, 17 g, Oral, Daily PRN, Pahwani, Ravi, MD   revefenacin (YUPELRI) nebulizer solution 175 mcg, 175 mcg, Nebulization, Daily, Ollis, Brandi L, NP, 175 mcg at 01/28/22 0757   senna-docusate (Senokot-S) tablet 1 tablet, 1  tablet, Oral, QHS PRN, Darliss Cheney, MD   ticagrelor (BRILINTA) tablet 90 mg, 90 mg, Oral, BID, 90 mg at 01/27/22 2139 **OR** ticagrelor (BRILINTA) tablet 90 mg, 90 mg, Per Tube, BID, Deveshwar, Willaim Rayas, MD, 90 mg at 01/27/22 0851   Patients Current Diet:  Diet Order                  DIET DYS 3 Room service appropriate? Yes; Fluid consistency: Thin  Diet effective now                       Precautions / Restrictions Precautions Precautions: Fall Precaution Comments: L inattention, R gaze preference, L hemiplegia, cortrak; L foot dressing Other Brace: dressing on L foot Restrictions Weight Bearing Restrictions: No Other Position/Activity Restrictions: No active bleeding noted in LLE this afternoon.    Has the patient had 2 or more falls or a fall with injury in the past year? No   Prior Activity Level Community (5-7x/wk): independent in community. Not working due to foot surgery. Driving   Prior Functional Level Self Care: Did the patient need help bathing, dressing, using the toilet or eating? Independent   Indoor Mobility: Did the patient need assistance with walking from room to room (with or without device)? Independent   Stairs: Did the patient need assistance with internal or external stairs (with or without device)? Independent   Functional Cognition: Did the patient need help planning regular tasks such as shopping or remembering to take medications? Independent   Patient Information Are you of Hispanic, Latino/a,or Spanish origin?: A. No, not of Hispanic, Latino/a, or Spanish origin What is your race?: A. White Do you need or want an interpreter to communicate with a doctor or health care staff?: 9. Unable to respond   Patient's Response To:  Health Literacy and Transportation Is the patient able to respond to health literacy and transportation needs?: No Health Literacy - How often do you need to have someone help you when you read instructions, pamphlets, or  other written material from your doctor or pharmacy?: Patient unable to respond   [place] / [place] Devices/Equipment: None Home Equipment: None  Prior Device Use: Indicate devices/aids used by the patient prior to current illness, exacerbation or injury? None of the above   Current Functional Level Cognition   Arousal/Alertness: Lethargic Overall Cognitive Status: Impaired/Different from baseline Difficult to assess due to: Impaired communication Current Attention Level: Focused Orientation Level: Oriented X4 Following Commands: Follows one step commands consistently Safety/Judgement: Decreased awareness of safety, Decreased awareness of deficits General Comments: Pt significantley distracted today and perseverated on being cold and needing blankets. Pt requires constant redirection back to task and constant cues to tend to her L side. Pt with severe L neglect of body and environment. Attention: Focused, Sustained Focused Attention: Impaired Focused Attention Impairment: Verbal basic Sustained Attention: Impaired Sustained Attention Impairment: Verbal basic Problem Solving: Impaired Problem Solving Impairment: Verbal basic    Extremity Assessment (includes Sensation/Coordination)   Upper Extremity Assessment: LUE deficits/detail RUE Deficits / Details: WFL for tasks assessed in seated position. Pt performing grooming with RUE. MMT not directly assessed RUE Sensation: WNL RUE Coordination: WNL LUE Deficits / Details: No active movement noted during session LUE Sensation: decreased light touch LUE Coordination: decreased fine motor, decreased gross motor  Lower Extremity Assessment: Defer to PT evaluation LLE Deficits / Details: not moving actively, some weight support with knee blocked during brief standing trial; keeping leg externally rotated and flexed in bed, has toes wrapped with kerlix LLE Sensation: history of peripheral neuropathy LLE  Coordination: decreased gross motor     ADLs   Overall ADL's : Needs assistance/impaired Eating/Feeding: NPO Grooming: Moderate assistance, Sitting Grooming Details (indicate cue type and reason): Pt requires max cues to finish the task. Pt will start task and become distracted soon after starting. Upper Body Bathing: Moderate assistance, Sitting (in recliner) Upper Body Bathing Details (indicate cue type and reason): Washing LUE with mod A for placement of LUE; RUE performing washing task. Pt with greater ability to compensate (L head turn and switching gaze toward L ~10 degrees during this task) Lower Body Bathing: Total assistance Upper Body Dressing : Maximal assistance, Sitting Lower Body Dressing: Total assistance, Bed level Toilet Transfer: Maximal assistance, +2 for physical assistance, BSC/3in1 Toilet Transfer Details (indicate cue type and reason): Pt unable to fully stand today c/o pain in LLE. Pt scooted up and down bed with max assist of 1 and use of pad to prepare for transfers to [facility]. Pt unwilling to leave bed bc pt was cold. Toileting- Clothing Manipulation and Hygiene: Total assistance Functional mobility during ADLs: Maximal assistance, +2 for physical assistance, +2 for safety/equipment General ADL Comments: bed level ROM for L hemi body, transferred to sitting with min-max A sitting balance and stood EOB x2     Mobility   Overal bed mobility: Needs Assistance Bed Mobility: Supine to Sit, Sit to Supine, Rolling Rolling: Min assist Sidelying to sit: Mod assist Supine to sit: Max assist, HOB elevated Sit to supine: Mod assist General bed mobility comments: Pt impulsive when getting up to the side of the bed. Pt, despite cues to move one step at a time, attempts to get up unsupervised in a impulsive, unsafe fashion.  Talked to family at length about giving one simple command at a time to attempt to get pt to slow down and become safer with mobility.     Transfers   Overall  transfer level: Needs assistance Equipment used: 2 person hand held assist (face to face transfer with gait belt and bed pad) Transfers: Sit to/from Stand, Bed to chair/wheelchair/BSC Sit to Stand: +2  physical assistance, Mod assist Bed to/from chair/wheelchair/BSC transfer type:: Step pivot Squat pivot transfers: Max assist, +2 physical assistance Step pivot transfers: Max assist, +2 physical assistance General transfer comment: Pt focused on being cold today and unwilling to attempt sit to stand.  Addressed lateral scoots in bed     Ambulation / Gait / Stairs / Wheelchair Mobility   Ambulation/Gait General Gait Details: unable at this time     Posture / Balance Dynamic Sitting Balance Sitting balance - Comments: Pt pulling self down to laying position to R in bed while in sitting.  Weight bearing tasks initiated with LUE but pt unable to to tend to ask at hand to teach about weight bearing at this time. Balance Overall balance assessment: Needs assistance Sitting-balance support: Feet supported, Single extremity supported Sitting balance-Leahy Scale: Poor Sitting balance - Comments: Pt pulling self down to laying position to R in bed while in sitting.  Weight bearing tasks initiated with LUE but pt unable to to tend to ask at hand to teach about weight bearing at this time. Postural control: Right lateral lean, Other (comment) (wanting to lay down in bed to her right) Standing balance support: Bilateral upper extremity supported Standing balance-Leahy Scale: Poor Standing balance comment: max A +2 to stand     Special needs/care consideration Hgb A1c 8.0 Palliative for goals of care on acute DNR Advanced directives completed on acute as well as MOST forms    Previous Home Environment  Living Arrangements: Spouse/significant other  Lives With: Spouse Available Help at Discharge: Family, Available 24 hours/day Type of Home: Mobile home Home Layout: One level Home Access: Stairs to  enter Entrance Stairs-Rails: Right, Left Entrance Stairs-Number of Steps: 5 Bathroom Shower/Tub: Multimedia programmer: Programmer, systems: Yes Home Care Services: No Additional Comments: post op shoe for L foot s/p toe amputation   Discharge Living Setting Plans for Discharge Living Setting: Patient's home Type of Home at Discharge: Mobile home Discharge Home Layout: One level Discharge Home Access: Stairs to enter Entrance Stairs-Rails: Right, Left Entrance Stairs-Number of Steps: 5 Discharge Bathroom Shower/Tub: Walk-in shower Discharge Bathroom Toilet: Standard Discharge Bathroom Accessibility: Yes Does the patient have any problems obtaining your medications?: No   Social/Family/Support Systems Patient Roles: Spouse, Parent Anticipated Caregiver: Husband, [name] Anticipated Ambulance person Information: [phone] Ability/Limitations of Caregiver: none Caregiver Availability: 24/7 Discharge Plan Discussed with Primary Caregiver: Yes Is Caregiver In Agreement with Plan?: Yes Does Caregiver/Family have Issues with Lodging/Transportation while Pt is in Rehab?: No   Goals Patient/Family Goal for Rehab: min A, PT, OT, SLP Expected length of stay: 14-21 days Pt/Family Agrees to Admission and willing to participate: Yes Program Orientation Provided & Reviewed with Pt/Caregiver Including Roles  & Responsibilities: Yes  Barriers to Discharge: Insurance for SNF coverage   Decrease burden of Care through IP rehab admission: n/a   Possible need for SNF placement upon discharge: not anticipated   Patient Condition: I have reviewed medical records from [facility], spoken with  TOC , and patient and spouse. I met with patient at the bedside for inpatient rehabilitation assessment.  Patient will benefit from ongoing PT, OT, and SLP, can actively participate in 3 hours of therapy a day 5 days of the week, and can make measurable gains during the admission.   Patient will also benefit from the coordinated team approach during an Inpatient Acute Rehabilitation admission.  The patient will receive intensive therapy as well as Rehabilitation physician, nursing, social worker, and care  management interventions.  Due to safety, skin/wound care, disease management, medication administration, pain management, and patient education the patient requires 24 hour a day rehabilitation nursing.  The patient is currently mod assist overall with mobility and basic ADLs.  Discharge setting and therapy post discharge at home with home health is anticipated.  Patient has agreed to participate in the Acute Inpatient Rehabilitation Program and will admit today.   Preadmission Screen Completed By:  Cleatrice Burke, 01/28/2022 10:28 AM ______________________________________________________________________   Discussed status with Dr. Tressa Busman on 01/28/22 at 1027 and received approval for admission today.   Admission Coordinator:  Cleatrice Burke, RN, time 1027 Date 01/28/22    Assessment/Plan: Diagnosis: Does the need for close, 24 hr/day Medical supervision in concert with the patient's rehab needs make it unreasonable for this patient to be served in a less intensive setting? 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 Yes Does the patient require coordinated care of a physician, rehab nurse, PT, OT, and SLP to address physical and functional deficits in the context of the above medical diagnosis(es)? Yes Addressing deficits in the following areas: balance, endurance, locomotion,  strength, transferring, bowel/bladder control, bathing, dressing, feeding, grooming, toileting, cognition, speech, and swallowing Can the patient actively participate in an intensive therapy program of at least 3 hrs of therapy 5 days a week? Yes The potential for patient to make measurable gains while on inpatient rehab is good Anticipated functional outcomes upon discharge from inpatient rehab: min assist and mod assist PT, min assist and mod assist OT, supervision and min assist SLP Estimated rehab length of stay to reach the above functional goals is: 14-21 days Anticipated discharge destination: Home 10. Overall Rehab/Functional Prognosis: fair     MD Signature:   Gertie Gowda, DO 01/28/2022          Revision History                                                              Note Details  Author Gertie Gowda, DO File Time 01/28/2022 11:22 AM  Author Type Physician Status Signed  Last Editor Gertie Gowda, DO Service Physical Medicine and [place] # [id number] Admit Date 01/28/2022  Gertie Gowda, DO  Physician Physical Medicine and Rehabilitation PMR Pre-admission    Signed Date of Service:  01/12/2022  1:06 PM  Related encounter: ED to Hosp-Admission (Discharged) from 01/08/2022 in [facility]   Signed      PMR Admission Coordinator Pre-Admission Assessment   Patient: Lauren [name] is an 41 y.o., female MRN: [id number] DOB: 03-24-1981 Height: _0  (165.1 cm) Weight: 90.7 kg   Insurance Information HMO:     PPO:      PCP:      IPA:      80/20:      OTHER:  PRIMARY: [employer] Focus Plan      Policy#: [id number]   Group # [id number]   Subscriber: Patient CM Name: [employer]  Phone#: [phone]     Fax#: [phone] Pre-Cert#: [id number]  approved for 7 days from admit    Employer: [employer] Benefits:  Phone #: [phone]     Name: 9/19 Eff. Date: 06/03/21     Deduct: none       Out of Pocket Max: $2500      Life Max: none CIR: $1150 co pay per admission      SNF: 80% 120 days per year Outpatient: 80%     Co-Pay: visits per medical neccesity Home Health: 80%      Co-Pay: visit per medical necessity DME: 80%     Co-Pay: 20% Providers: in network  SECONDARY: none       Financial Counselor:       Phone#:    The Engineer, [employer]" for patients in Inpatient Rehabilitation Facilities with attached "Privacy Act [place] Records" was provided and verbally reviewed with: N/A   Emergency Contact Information Contact Information       Name Relation Home Work Mobile    [place] Spouse [phone]   [place]     [place] Father     [phone]    Jozy, [place] Daughter     [phone]         Current Medical History  Patient Admitting Diagnosis: CVA   History of Present Illness: 41 year old female presented to [facility] 01/08/22 with left sided facial droop, left sided weakness, and slurred speech. 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 No core infarct or penumbra. Patient went immediately to IR for thrombectomy and noted R ICA dissection requiring repair.   Of note, patient with recent arterial thrombectomy of the right upper extremity due to limb ischemia. She has had URI x 2 days with recent sick contact. COVID negative but Rhinovirus positive. Extubated 9/10 on BiPAP initially .   On 9/13 HFNC and then febrile. Re intubated on 9/15 with eventual extubation on 9/18. ID changed antimicrobial coverage to cefadroxil and flagyl. Required Precedex for restlessness in ICU. Pulmonary felt respiratory distress is more so due to volume. Treated with IV lasix, Maretta Bees, Brovana and pulmicort nebulizer and aspiration precautions. Continue to titrate and wean Oxygen. Smoking cessation counseling.     Echo on 9/14 with EF 35 to 40 % and grade II diastolic dysfunction. Cardiology consulted and felt likely to need ischemic eval as outpatient. Continue isosorbide mononitrate, metoprolol . Atorvastatin and brilinta. No ACEi/ARB/ARNIU/MRA/SGLT2 given renal funtion. Anemia felt secondary to anemia of chronic kidney disease. Transfused this admit and to continue to monitor. Type 1 DM on Levemir and SSI. Synthroid for hypothyroidism.    Left foot osteomyelitis and diabetic ulcer. Podiatry consulted with no acute OR plans. Cefadroxil and metronidazole per ID. Nutrition per cortrak feeds with SLP to follow.   On eliquis prior to admit. Continue Statin and brilinta. Neuro recommended repeat hyper coag workup in 3 months.    Complete NIHSS TOTAL: 16   Patient's medical record from [facility] has been reviewed by the rehabilitation admission coordinator and physician.   Past Medical History      Past Medical History:  Diagnosis Date   Anemia     CAD (coronary artery disease)      a. s/p cath in 03/2014 showing 30% mid-LAD, moderate to severe disease along small D1, patent LCx, moderate to severe distal  OM2 stenosis and moderate diffuse diease along RCA not amenable to PCI   CHF (congestive heart failure) (HCC)      a. EF 55-60% in 12/2019 b. EF at 35-40% by echo in 05/2020   CKD (chronic kidney disease) stage 4, GFR 15-29 ml/min (HCC)     Diabetes mellitus without complication ([place])     Myocardial infarction ([place])     Neuropathy     Stroke ([place])      Has the patient had major surgery during 100 days prior to admission? no   Family History   family history includes Breast cancer (age of onset: 73) in her paternal grandmother; CAD in her father and mother; CVA in her mother; Cancer in her paternal grandmother; Hyperlipidemia in her father and mother; Hypertension in her father and mother; Thyroid disease in her mother.   Current Medications   Current Facility-Administered Medications:     acetaminophen (TYLENOL) tablet 650 mg, 650 mg, Oral, Q4H PRN, 650 mg at 01/27/22 1808 **OR** acetaminophen (TYLENOL) 160 MG/5ML solution 650 mg, 650 mg, Per Tube, Q4H PRN, 650 mg at 01/25/22 0422 **OR** acetaminophen (TYLENOL) suppository 650 mg, 650 mg, Rectal, Q4H PRN, Deveshwar, Sanjeev, MD   acetaminophen (TYLENOL) tablet 500 mg, 500 mg, Oral, TID, Pahwani, Ravi, MD, 500 mg at 01/28/22 0832   arformoterol (BROVANA) nebulizer solution 15 mcg, 15 mcg, Nebulization, BID, Ollis, Brandi L, NP, 15 mcg at 01/28/22 0744   atorvastatin (LIPITOR) tablet 40 mg, 40 mg, Per Tube, Daily, Candee Furbish, MD, 40 mg at 01/27/22 0850   budesonide (PULMICORT) nebulizer solution 0.5 mg, 0.5 mg, Nebulization, BID, Ollis, Brandi L, NP, 0.5 mg at 01/28/22 0750   Chlorhexidine Gluconate Cloth 2 % PADS 6 each, 6 each, Topical, Daily, Mannam, Praveen, MD, 6 each at 01/27/22 1240   docusate sodium (COLACE) capsule 100 mg, 100 mg, Oral, BID PRN, Darliss Cheney, MD   hydrALAZINE (APRESOLINE) tablet 12.5 mg, 12.5 mg, Oral, Q8H, Pahwani, Ravi, MD, 12.5 mg at 01/28/22 0507   influenza vac split quadrivalent PF (FLUARIX) injection 0.5 mL, 0.5 mL, Intramuscular, Prior to discharge, Candee Furbish, MD   insulin aspart (novoLOG) injection 0-20 Units, 0-20 Units, Subcutaneous, Q4H, Icard, Bradley L, DO, 3 Units at 01/28/22 0831   insulin detemir (LEVEMIR) injection 25 Units, 25 Units, Subcutaneous, BID, Pahwani, Ravi, MD, 25 Units at 01/27/22 2143   ipratropium-albuterol (DUONEB) 0.5-2.5 (3) MG/3ML nebulizer solution 3 mL, 3 mL, Nebulization, Q4H PRN, Mannam, Praveen, MD, 3 mL at 01/14/22 0257   isosorbide mononitrate (ISMO) tablet 20 mg, 20 mg, Oral, BID, Pahwani, Ravi, MD   levothyroxine (SYNTHROID) tablet 75 mcg, 75 mcg, Oral, Q0600, Darliss Cheney, MD, 75 mcg at 01/28/22 0508   melatonin tablet 3 mg, 3 mg, Oral, QHS, Pahwani, Ravi, MD   metoprolol tartrate (LOPRESSOR) injection 5 mg, 5 mg, Intravenous, Q8H PRN, Allie Bossier,  MD   metoprolol tartrate (LOPRESSOR) tablet 37.5 mg, 37.5 mg, Oral, BID, Pahwani, Ravi, MD   nicotine (NICODERM CQ - dosed in mg/24 hours) patch 21 mg, 21 mg, Transdermal, Daily, Ollis, Brandi L, NP, 21 mg at 01/27/22 0854   nitroGLYCERIN (NITROSTAT) SL tablet 0.4 mg, 0.4 mg, Sublingual, Q5 min PRN, Allie Bossier, MD, 0.4 mg at 01/23/22 1357   Oral care mouth rinse, 15 mL, Mouth Rinse, PRN, Elsie Lincoln, MD, 15 mL at 01/10/22 2034   oxyCODONE (Oxy IR/ROXICODONE) immediate release tablet 5 mg, 5 mg, Oral, Q6H PRN, Darliss Cheney, MD, 5 mg at  01/27/22 1809   pantoprazole (PROTONIX) EC tablet 40 mg, 40 mg, Oral, Daily, Pahwani, Ravi, MD   pneumococcal 20-valent conjugate vaccine (PREVNAR 20) injection 0.5 mL, 0.5 mL, Intramuscular, Prior to discharge, Candee Furbish, MD   polyethylene glycol (MIRALAX / GLYCOLAX) packet 17 g, 17 g, Oral, Daily PRN, Pahwani, Ravi, MD   revefenacin (YUPELRI) nebulizer solution 175 mcg, 175 mcg, Nebulization, Daily, Ollis, Brandi L, NP, 175 mcg at 01/28/22 0757   senna-docusate (Senokot-S) tablet 1 tablet, 1 tablet, Oral, QHS PRN, Darliss Cheney, MD   ticagrelor (BRILINTA) tablet 90 mg, 90 mg, Oral, BID, 90 mg at 01/27/22 2139 **OR** ticagrelor (BRILINTA) tablet 90 mg, 90 mg, Per Tube, BID, Deveshwar, Willaim Rayas, MD, 90 mg at 01/27/22 0851   Patients Current Diet:  Diet Order                  DIET DYS 3 Room service appropriate? Yes; Fluid consistency: Thin  Diet effective now                       Precautions / Restrictions Precautions Precautions: Fall Precaution Comments: L inattention, R gaze preference, L hemiplegia, cortrak; L foot dressing Other Brace: dressing on L foot Restrictions Weight Bearing Restrictions: No Other Position/Activity Restrictions: No active bleeding noted in LLE this afternoon.    Has the patient had 2 or more falls or a fall with injury in the past year? No   Prior Activity Level Community (5-7x/wk): independent in community.  Not working due to foot surgery. Driving   Prior Functional Level Self Care: Did the patient need help bathing, dressing, using the toilet or eating? Independent   Indoor Mobility: Did the patient need assistance with walking from room to room (with or without device)? Independent   Stairs: Did the patient need assistance with internal or external stairs (with or without device)? Independent   Functional Cognition: Did the patient need help planning regular tasks such as shopping or remembering to take medications? Independent   Patient Information Are you of Hispanic, Latino/a,or Spanish origin?: A. No, not of Hispanic, Latino/a, or Spanish origin What is your race?: A. White Do you need or want an interpreter to communicate with a doctor or health care staff?: 9. Unable to respond   Patient's Response To:  Health Literacy and Transportation Is the patient able to respond to health literacy and transportation needs?: No Health Literacy - How often do you need to have someone help you when you read instructions, pamphlets, or other written material from your doctor or pharmacy?: Patient unable to respond   Home Assistive Devices / [place] Devices/Equipment: None Home Equipment: None   Prior Device Use: Indicate devices/aids used by the patient prior to current illness, exacerbation or injury? None of the above   Current Functional Level Cognition   Arousal/Alertness: Lethargic Overall Cognitive Status: Impaired/Different from baseline Difficult to assess due to: Impaired communication Current Attention Level: Focused Orientation Level: Oriented X4 Following Commands: Follows one step commands consistently Safety/Judgement: Decreased awareness of safety, Decreased awareness of deficits General Comments: Pt significantley distracted today and perseverated on being cold and needing blankets. Pt requires constant redirection back to task and constant cues to tend to  her L side. Pt with severe L neglect of body and environment. Attention: Focused, Sustained Focused Attention: Impaired Focused Attention Impairment: Verbal basic Sustained Attention: Impaired Sustained Attention Impairment: Verbal basic Problem Solving: Impaired Problem Solving Impairment: Verbal basic  Extremity Assessment (includes Sensation/Coordination)   Upper Extremity Assessment: LUE deficits/detail RUE Deficits / Details: WFL for tasks assessed in seated position. Pt performing grooming with RUE. MMT not directly assessed RUE Sensation: WNL RUE Coordination: WNL LUE Deficits / Details: No active movement noted during session LUE Sensation: decreased light touch LUE Coordination: decreased fine motor, decreased gross motor  Lower Extremity Assessment: Defer to PT evaluation LLE Deficits / Details: not moving actively, some weight support with knee blocked during brief standing trial; keeping leg externally rotated and flexed in bed, has toes wrapped with kerlix LLE Sensation: history of peripheral neuropathy LLE Coordination: decreased gross motor     ADLs   Overall ADL's : Needs assistance/impaired Eating/Feeding: NPO Grooming: Moderate assistance, Sitting Grooming Details (indicate cue type and reason): Pt requires max cues to finish the task. Pt will start task and become distracted soon after starting. Upper Body Bathing: Moderate assistance, Sitting (in recliner) Upper Body Bathing Details (indicate cue type and reason): Washing LUE with mod A for placement of LUE; RUE performing washing task. Pt with greater ability to compensate (L head turn and switching gaze toward L ~10 degrees during this task) Lower Body Bathing: Total assistance Upper Body Dressing : Maximal assistance, Sitting Lower Body Dressing: Total assistance, Bed level Toilet Transfer: Maximal assistance, +2 for physical assistance, BSC/3in1 Toilet Transfer Details (indicate cue type and reason): Pt  unable to fully stand today c/o pain in LLE. Pt scooted up and down bed with max assist of 1 and use of pad to prepare for transfers to [facility]. Pt unwilling to leave bed bc pt was cold. Toileting- Clothing Manipulation and Hygiene: Total assistance Functional mobility during ADLs: Maximal assistance, +2 for physical assistance, +2 for safety/equipment General ADL Comments: bed level ROM for L hemi body, transferred to sitting with min-max A sitting balance and stood EOB x2     Mobility   Overal bed mobility: Needs Assistance Bed Mobility: Supine to Sit, Sit to Supine, Rolling Rolling: Min assist Sidelying to sit: Mod assist Supine to sit: Max assist, HOB elevated Sit to supine: Mod assist General bed mobility comments: Pt impulsive when getting up to the side of the bed. Pt, despite cues to move one step at a time, attempts to get up unsupervised in a impulsive, unsafe fashion.  Talked to family at length about giving one simple command at a time to attempt to get pt to slow down and become safer with mobility.     Transfers   Overall transfer level: Needs assistance Equipment used: 2 person hand held assist (face to face transfer with gait belt and bed pad) Transfers: Sit to/from Stand, Bed to chair/wheelchair/BSC Sit to Stand: +2 physical assistance, Mod assist Bed to/from chair/wheelchair/BSC transfer type:: Step pivot Squat pivot transfers: Max assist, +2 physical assistance Step pivot transfers: Max assist, +2 physical assistance General transfer comment: Pt focused on being cold today and unwilling to attempt sit to stand.  Addressed lateral scoots in bed     Ambulation / Gait / Stairs / Wheelchair Mobility   Ambulation/Gait General Gait Details: unable at this time     Posture / Balance Dynamic Sitting Balance Sitting balance - Comments: Pt pulling self down to laying position to R in bed while in sitting.  Weight bearing tasks initiated with LUE but pt unable to to tend to ask at  hand to teach about weight bearing at this time. Balance Overall balance assessment: Needs assistance Sitting-balance support: Feet supported, Single extremity  supported Sitting balance-Leahy Scale: Poor Sitting balance - Comments: Pt pulling self down to laying position to R in bed while in sitting.  Weight bearing tasks initiated with LUE but pt unable to to tend to ask at hand to teach about weight bearing at this time. Postural control: Right lateral lean, Other (comment) (wanting to lay down in bed to her right) Standing balance support: Bilateral upper extremity supported Standing balance-Leahy Scale: Poor Standing balance comment: max A +2 to stand     Special needs/care consideration Hgb A1c 8.0 Palliative for goals of care on acute DNR Advanced directives completed on acute as well as MOST forms    Previous Home Environment  Living Arrangements: Spouse/significant other  Lives With: Spouse Available Help at Discharge: Family, Available 24 hours/day Type of Home: Mobile home Home Layout: One level Home Access: Stairs to enter Entrance Stairs-Rails: Right, Left Entrance Stairs-Number of Steps: 5 Bathroom Shower/Tub: Multimedia programmer: Programmer, systems: Yes Home Care Services: No Additional Comments: post op shoe for L foot s/p toe amputation   Discharge Living Setting Plans for Discharge Living Setting: Patient's home Type of Home at Discharge: Mobile home Discharge Home Layout: One level Discharge Home Access: Stairs to enter Entrance Stairs-Rails: Right, Left Entrance Stairs-Number of Steps: 5 Discharge Bathroom Shower/Tub: Walk-in shower Discharge Bathroom Toilet: Standard Discharge Bathroom Accessibility: Yes Does the patient have any problems obtaining your medications?: No   Social/Family/Support Systems Patient Roles: Spouse, Parent Anticipated Caregiver: Husband, [name] Anticipated Ambulance person Information:  [phone] Ability/Limitations of Caregiver: none Caregiver Availability: 24/7 Discharge Plan Discussed with Primary Caregiver: Yes Is Caregiver In Agreement with Plan?: Yes Does Caregiver/Family have Issues with Lodging/Transportation while Pt is in Rehab?: No   Goals Patient/Family Goal for Rehab: min A, PT, OT, SLP Expected length of stay: 14-21 days Pt/Family Agrees to Admission and willing to participate: Yes Program Orientation Provided & Reviewed with Pt/Caregiver Including Roles  & Responsibilities: Yes  Barriers to Discharge: Insurance for SNF coverage   Decrease burden of Care through IP rehab admission: n/a   Possible need for SNF placement upon discharge: not anticipated   Patient Condition: I have reviewed medical records from [facility], spoken with  TOC , and patient and spouse. I met with patient at the bedside for inpatient rehabilitation assessment.  Patient will benefit from ongoing PT, OT, and SLP, can actively participate in 3 hours of therapy a day 5 days of the week, and can make measurable gains during the admission.  Patient will also benefit from the coordinated team approach during an Inpatient Acute Rehabilitation admission.  The patient will receive intensive therapy as well as Rehabilitation physician, nursing, social worker, and care management interventions.  Due to safety, skin/wound care, disease management, medication administration, pain management, and patient education the patient requires 24 hour a day rehabilitation nursing.  The patient is currently mod assist overall with mobility and basic ADLs.  Discharge setting and therapy post discharge at home with home health is anticipated.  Patient has agreed to participate in the Acute Inpatient Rehabilitation Program and will admit today.   Preadmission Screen Completed By:  Cleatrice Burke, 01/28/2022 10:28 AM ______________________________________________________________________   Discussed status  with Dr. Tressa Busman on 01/28/22 at 1027 and received approval for admission today.   Admission Coordinator:  Cleatrice Burke, RN, time 1027 Date 01/28/22    Assessment/Plan: Diagnosis: Does the need for close, 24 hr/day Medical supervision in concert with the patient's rehab needs make it  unreasonable for this patient to be served in a less intensive setting? 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 Yes Does the patient require coordinated care of a physician, rehab nurse, PT, OT, and SLP to address physical and functional deficits in the context of the above medical diagnosis(es)? Yes Addressing deficits in the following areas: balance, endurance, locomotion, strength, transferring, bowel/bladder control, bathing, dressing, feeding, grooming, toileting, cognition, speech, and swallowing Can the patient actively participate in an intensive therapy program of at least 3 hrs of therapy 5 days a week? Yes The potential for patient to make measurable gains while on inpatient rehab is good Anticipated functional outcomes upon discharge from inpatient rehab: min assist and mod assist PT, min assist and mod assist OT, supervision and min assist SLP Estimated rehab length of stay to reach the above functional goals is: 14-21 days Anticipated discharge destination: Home 10. Overall Rehab/Functional Prognosis: fair     MD Signature:   Gertie Gowda, DO 01/28/2022          Revision History                                                              Note Details  Author Gertie Gowda, DO File  Time 01/28/2022 11:22 AM  Author Type Physician Status Signed  Last Editor Gertie Gowda, DO Service Physical Medicine and [place] # [id number] Admit Date 01/28/2022

## 2022-01-28 NOTE — Progress Notes (Signed)
Speech Language Pathology Treatment: Dysphagia;Cognitive-Linquistic  Patient Details Name: Lauren [name] MRN: [id number] DOB: 1980/12/10 Today's Date: 01/28/2022 Time: 8185-6314 SLP Time Calculation (min) (ACUTE ONLY): 23 min  Assessment / Plan / Recommendation Clinical Impression   Pt was seen for treatment. She was alert and cooperative during the session. Pt reported that she has enjoyed the upgraded diet and has been able to reduce bolus sizes. Pt demonstrated use of individual boluses with thin liquids and no s/s of aspiration were noted. Pt requested that solids be deferred. 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 Speech intelligibility was reduced secondary to articulatory imprecision and reduced vocal intensity, but these were improved compared to when she was last seen by this SLP. Pt was oriented to time with cues. She demonstrated problem solving related to safety with 100% accuracy given min cues. She achieved 80% accuracy with time management problems given prompts for reasoning. The current plan is for pt to be discharged to [facility] and continued SLP services are recommended after discharge.    HPI HPI: Lauren [name] is a 41 y.o. female who presented to the ED for acute onset of chest pain with left side weakness. CT head right MCA and vertebral stroke; underwent IR thrombectomy of R ICA with R ICA dissection repair and stent placement with TICI 3. Intubated 9/8-10.  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 She had to be reintubated on 01/15/22 and extubated 01/18/22.      SLP Plan  Continue with current plan of care      Recommendations for follow up therapy are  one component of a multi-disciplinary discharge planning process, led by the attending physician.  Recommendations may be updated based on patient status, additional functional criteria and insurance authorization.    Recommendations  Diet recommendations: Dysphagia 3 (mechanical soft);Thin liquid Liquids provided via: Cup;Straw Medication Administration: Crushed with puree Supervision: Staff to assist with self feeding;Full supervision/cueing for compensatory strategies Compensations: Small sips/bites;Slow rate;Follow solids with liquid Postural Changes and/or Swallow Maneuvers: Seated upright 90 degrees                Plan: Continue with current plan of care         Deaunte Dente I. Hardin Negus, [place], [place] Office number [phone]   Horton Marshall  01/28/2022, 10:18 AM

## 2022-01-28 NOTE — TOC Transition Note (Signed)
Transition of Care [facility]) - CM/SW Discharge Note   Patient Details  Name: Jerrine Urschel MRN: [id number] Date of Birth: [birth year]  [facility] CM/SW Contact:  Pollie Friar, RN Phone Number: 01/28/2022, 10:33 AM   Clinical Narrative:    Pt discharging to CIR today. CM signing off.    Final next level of care: IP Rehab Facility Barriers to Discharge: No Barriers Identified   Patient Goals and CMS Choice   CMS Medicare.gov Compare Post Acute Care list provided to:: Patient Choice offered to / list presented to : Patient  Discharge Placement                       Discharge Plan and Services                                     Social Determinants of Health (SDOH) Interventions Food Insecurity Interventions: Inpatient TOC, Intervention Not Indicated   Readmission Risk Interventions     No data to display

## 2022-01-28 NOTE — Discharge Summary (Addendum)
Physician Discharge Summary  Aliany Fiorenza MEB:[id number] DOB: Aug 14, 1980 DOA: 01/08/2022  PCP: Lemmie Evens, MD  Admit date: 01/08/2022 Discharge date: 01/28/2022 30 Day Unplanned Readmission Risk Score    Flowsheet Row ED to Hosp-Admission (Current) from 01/08/2022 in [facility]  30 Day Unplanned Readmission Risk Score (%) 42.95 Filed at 01/28/2022 0801       This score is the patient's risk of an unplanned readmission within 30 days of being discharged (0 -100%). The score is based on dignosis, age, lab data, medications, orders, and past utilization.   Low:  0-14.9   Medium: 15-21.9   High: 22-29.9   Extreme: 30 and above          Admitted From: Home Disposition: CIR  Recommendations for Outpatient Follow-up:  Follow up with PCP in 1-2 weeks Please obtain BMP/CBC in one week For low up with neurology in 2 to 4 weeks after discharge from CIR Please follow up with your PCP on the following pending results: Unresulted Labs (From admission, onward)     Start     Ordered   01/24/22 0500  Magnesium  Daily at 5am,   R     Question:  Specimen collection method  Answer:  Lab=Lab collect   01/23/22 1806   01/24/22 0500  Phosphorus  Daily at 5am,   R     Question:  Specimen collection method  Answer:  Lab=Lab collect   01/23/22 1806   01/24/22 0500  CBC with Differential/Platelet  Daily at 5am,   R     Question:  Specimen collection method  Answer:  Lab=Lab collect   01/23/22 1806   Signed and Held  Comprehensive metabolic panel  Tomorrow morning,   R       Question:  Specimen collection method  Answer:  Unit=Unit collect   Signed and Held   Signed and Held  Basic metabolic panel  Weekly,   R     Question:  Specimen collection method  Answer:  Unit=Unit collect   Signed and Held   Signed and Held  CBC with Differential/Platelet  Tomorrow morning,   R       Question:  Specimen collection method  Answer:  Unit=Unit collect   Signed and Held   Signed and Held   Magnesium  Tomorrow morning,   R       Question:  Specimen collection method  Answer:  Unit=Unit collect   Signed and Held   Signed and Held  Phosphorus  Tomorrow morning,   R       Question:  Specimen collection method  Answer:  Unit=Unit collect   Signed and Held   Signed and Held  CBC  Weekly,   R     Question:  Specimen collection method  Answer:  Unit=Unit collect   Signed and [place]: None Equipment/Devices: None  Discharge Condition: Stable CODE STATUS: DNR Diet recommendation: Dysphagia 3 diet  Subjective: Patient seen and examined.  No complaints.  Status quo.  Brief/Interim Summary: 41 yr old female with a history of CAD, chronic diastolic heart failure, diabetes mellitus type 2 with peripheral neuropathy with recent arterial thrombectomy to the right upper extremity due to limb ischemia, CKD stage IIIa, peripheral arterial disease was admitted with right MCA CVA and V3 segment of the right vertebral artery stenosis with left ICA stenosis.  She was initially admitted under PCCM service,  extubated on 01/18/2022 and transferred to [facility] on 01/21/2022.   Significant Events: 9/8 Admitted with left sided weakness, facial droop.  NIHSS 8, to IR  for thrombectomy, intubated.  MRSA PCR +, Rhinovirus + 9/10 PSV wean, moving RUE/RLE, extubated  9/11 ECHO>, CT head negative for acute changes, Trach asp negative,  9/13 started on HFNC 10L overnight 9/14 tmax 101.2, remains on HFNC, MRI, Heme/onc and ID consult 9/18 extubated, infectious disease change antimicrobial coverage to cefadroxil and Flagyl 9/19: Required Precedex overnight for restlessness.  Off by a.m. rounds   Significant studies: CT head Periventricular white matter hypodensities adjacent to the frontal horn of the right lateral ventricles and in the right corona radiata are new since prior exam. This may represent acute ischemia. CTA head & neck - Age indeterminate occlusion of the proximal right  ICA in the neck with non opacification distally in the upper neck and intracranially. Right MCA and A1 ACA are patent, but diminutive. Severe left paraclinoid ICA stenosis. CT perfusion, no evidence of core infarct or penumbra; however, there is approximately 38 mL of T-max greater than 4 seconds in the right MCA territory which suggests possible oligemia. IR with TICI3 of intracranial R ICA, however complicated by ICA dissection status post telescope stents Post IR CT - Redemonstrated hypodensity in the right frontal WM MRI x 2 Extensive patchy areas of acute infarction involving the right MCA territory, most confluent areas along the watershed. MRA  Asymmetric diffused decreased size of right MCA branch vessels compared to the left likely reflects decreased perfusion   Acute ischemic stroke of the right MCA and punctuated left MCA/ACA infarct with right ICA occlusion, s/p repair with TICI, 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  -Etiology unclear likely large vessel disease, cannot rule out cardioembolic source -Initially admitted to ICU under PCCM service, had to be intubated -2D echo showed EF of 30% with LDL 39, A1c 8.0 -Hypercoagulable work-up showed abnormal lupus anticoagulant but was also on heparin, slightly increased rheumatoid factor -Plan to repeat labs in 3 months and follow-up hematology as outpatient; hematology has been notified -She was previously on Eliquis and now switched to Lovenox per oncology recommendation, she should be on Lovenox therapeutically.  Patient is also on Brilinta.  Lovenox was held 2 days prior due to anemia.  I recommend resuming tomorrow.  She continues to have left hemiplegia with left facial droop and dysarthria.   Dysphagia: Patient had difficulty swallowing, she was placed CORTRACK and tube feedings were restarted.  SLP was following.  They advanced her to dysphagia 3 diet few days ago.  Palliative care was following patient.  Patient  eventually decided to take out the feeding tube.  She declined PEG tube placement as well.  She has excepted the fact that if she could not have enough nutrition through p.o., she would not want PEG tube or any other sort of tube for the feedings and that she would accept passing peacefully.   History of limb ischemia/hypercoagulability - recent h/o  right upper extremity ischemia -S/p thrombectomy -Patient was on Eliquis PTA -Switched to Lovenox per oncology recommendation.  -She will need hematology follow-up as outpatient   Acute respiratory failure with hypoxia/COPD exacerbation/rhinovirus -Resolved -She was initially intubated and admitted under PCCM service, now extubated -She was started on IV Lasix which has been discontinued -Chest x-ray showed right infrahilar opacity which has improved, procalcitonin high at 1.18 -She is currently not requiring oxygen, O2 sats 98% on room air -Continue Brovana,  Pulmicort   Acute kidney injury on CKD stage IV -Early contrast-induced nephropathy, creatinine peaked at 5 -Creatinine is down to 1.98. -Lasix 1 was on hold and cardiology recommended discontinuing as she recently recovered from AKI and she appears euvolemic.   Chronic diastolic and systolic heart failure:  -2D echo showed EF of 96%, grade 2 diastolic dysfunction;With wall motion abnormality to the LAD distribution question obstruction versus stress-induced cardiomyopathy. -Euvolemic -Cardiology was consulted and relates this may be from demand ischemia from CHF, she will need ischemic evaluation as outpatient at some point in time. -She was started on Imdur, metoprolol, statin; continue on Brilinta -No ACE inhibitor/ARB due to renal dysfunction.    Acute on chronic anemia: Hemoglobin dropped to 6.9 on 01/23/2022, received 1 unit of PRBC transfusion.  Hemoglobin over 7.4 on 01/24/2022 but drifting down and dropped to 6.5 on 01/26/2022 for which she received 1 unit of point RBC  transfusion, Lovenox was held.  I recommend resuming Lovenox tomorrow on 01/29/2022.  Hemoglobin has remained stable for last 2 days now.  Hypertension -Patient is currently on metoprolol, Imdur -Blood pressure well controlled   Diabetes mellitus type 1/DKA on admission -DKA has resolved.  Blood sugar was controlled on Semglee 25 units twice daily with SSI.  Discharging on same.   Hyperlipidemia -Continue fenofibrate, Lipitor   Hypothyroidism: It appears that patient was taking Synthroid 175 mcg p.o. daily prior to admission.  Her last TSH was checked in April which was significantly high at 25.  For some reason, she was started on only 75 mcg p.o. daily yesterday by pharmacy without physician's approval.  I have ordered repeat TSH.  I am discharging her on 125 mcg at the moment.  Based on the TSH, her Synthroid dose should be readjusted.  I will be happy to help CIR physician with making that decision even after patient discharges to CIR.   Left foot metatarsal osteomyelitis/status post amputation on 09/02/2021 status post bone biopsy on 01/16/2022 diabetic foot ulcer: -Podiatrist was consulted recommended no surgical intervention at this point in time. Recommended to continue cefadroxil and Flagyl, andstop antibiotics if culture is negative. Abx have been stopped. -will require ID follow-up as an outpatient. ID was consulted ; culture from bone biopsy performed on 01/16/2022 negative till date.   Leukocytosis Discussed with ID regarding persistent leukocytosis -Does not seem to be infectious She was on steroids till 9/16, may be reactive.  Follow CBC.   Class 2 obesity due to excess calories without serious comorbidity with body mass index (BMI) of 39.0 to 39.9  in adult: Noted.   Tobacco abuse: Current smoker she has been counseled, nicotine patch was placed  Discharge plan was discussed with patient and/or family member and they verbalized understanding and agreed with it.  Discharge  Diagnoses:  Principal Problem:   Acute ischemic stroke [facility]) Active Problems:   Aspiration pneumonia ([place])   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[place])   Hypothyroidism   CAD (coronary artery disease)   Acute renal failure superimposed on stage 4 chronic kidney disease (HCC)   Tobacco abuse   Class 2 obesity due to excess calories without serious comorbidity with body mass index (BMI) of 39.0 to 39.9 in adult   Acute osteomyelitis of left foot ([place])   Internal carotid artery occlusion, right   Acute respiratory failure with hypoxia (HCC)   HFrEF (heart failure with reduced ejection fraction) (HCC)   Elevated troponin    Discharge Instructions  Discharge Instructions  Ambulatory referral to Neurology   Complete by: As directed    Follow up with Dr. Leonie Man at [facility] in 4-6 weeks. Too complicated for RN to follow. Thanks.      Allergies as of 01/28/2022       Reactions   Wellbutrin [bupropion] Hives   Cefepime Rash   Tolerates penicilllin   Ciprofloxacin Hcl Hives, Rash   Hives/rash at injection site; 01/15/22 tolerated IV cipro   Tape Rash        Medication List     STOP taking these medications    amoxicillin-clavulanate 875-125 MG tablet Commonly known as: AUGMENTIN   clopidogrel 75 MG tablet Commonly known as: PLAVIX   Eliquis 5 MG Tabs tablet Generic drug: apixaban   fenofibrate 160 MG tablet   furosemide 40 MG tablet Commonly known as: LASIX   isosorbide mononitrate 30 MG 24 hr tablet Commonly known as: IMDUR Replaced by: isosorbide mononitrate 20 MG tablet   metoprolol succinate 25 MG 24 hr tablet Commonly known as: TOPROL-XL   oxyCODONE 5 MG immediate release tablet Commonly known as: Oxy IR/ROXICODONE   potassium chloride SA 20 MEQ tablet Commonly known as: KLOR-CON M   predniSONE 5 MG tablet Commonly known as: DELTASONE       TAKE these medications    arformoterol 15 MCG/2ML Nebu Commonly known as: BROVANA Take 2 mLs  (15 mcg total) by nebulization 2 (two) times daily.   atorvastatin 40 MG tablet Commonly known as: LIPITOR TAKE 1 TABLET BY MOUTH ONCE DAILY. What changed:  how much to take how to take this when to take this   budesonide 0.5 MG/2ML nebulizer solution Commonly known as: PULMICORT Take 2 mLs (0.5 mg total) by nebulization 2 (two) times daily.   Dexcom G6 Sensor Misc Change sensor every 10 days as directed   Dexcom G6 Transmitter Misc Change transmitter every 90 days as directed.   enoxaparin 80 MG/0.8ML injection Commonly known as: LOVENOX Inject 0.8 mLs (80 mg total) into the skin daily.   hydrALAZINE 25 MG tablet Commonly known as: APRESOLINE Take 0.5 tablets (12.5 mg total) by mouth every 8 (eight) hours. What changed:  how much to take when to take this   insulin aspart 100 UNIT/ML injection Commonly known as: novoLOG Inject 0-20 Units into the skin every 4 (four) hours.   insulin detemir 100 UNIT/ML injection Commonly known as: LEVEMIR Inject 0.25 mLs (25 Units total) into the skin 2 (two) times daily.   isosorbide mononitrate 20 MG tablet Commonly known as: ISMO Take 1 tablet (20 mg total) by mouth 2 (two) times daily. Replaces: isosorbide mononitrate 30 MG 24 hr tablet   levothyroxine 175 MCG tablet Commonly known as: SYNTHROID Take 175 mcg by mouth daily before breakfast.   Metoprolol Tartrate 37.5 MG Tabs Take 37.5 mg by mouth 2 (two) times daily.   nitroGLYCERIN 0.4 MG SL tablet Commonly known as: NITROSTAT Place 1 tablet (0.4 mg total) under the tongue every 5 (five) minutes x 3 doses as needed for chest pain.   Omnipod 5 G6 Intro (Gen 5) Kit Change pod every 48-72 hrs   Omnipod 5 G6 Pod (Gen 5) Misc Change pod every 48-72 hours   pantoprazole 40 MG tablet Commonly known as: PROTONIX Take 1 tablet (40 mg total) by mouth daily as needed for acid reflux What changed: reasons to take this   revefenacin 175 MCG/3ML nebulizer solution Commonly  known as: YUPELRI Take 3 mLs (175 mcg total) by nebulization daily.  Start taking on: January 29, 2022   ticagrelor 90 MG Tabs tablet Commonly known as: BRILINTA Take 1 tablet (90 mg total) by mouth 2 (two) times daily.        Follow-up Information     Erma Heritage, PA-C Follow up.   Specialties: Physician Assistant, Cardiology Why: Hospital follow-up with Cardiology scheduled for 02/19/2022 at 2:30pm. Please arrive 15 minutes early for check-in. If this date/time does not work for you, please call our office to reschedule. Contact information: [address] [place] [address] [phone]         Garvin Fila, MD. Schedule an appointment as soon as possible for a visit in 1 month(s).   Specialties: Neurology, Radiology Why: stroke clinic Contact information: [address] [place] [address] [phone]         Lemmie Evens, MD Follow up in 1 week(s).   Specialty: Family Medicine Contact information: [place] [address] [phone]         Arnoldo Lenis, MD .   Specialty: Cardiology Contact information: [place] [address] [phone]         Benay Pike, MD Follow up in 4 week(s).   Specialty: Hematology and Oncology Contact information: [place] [address] [phone]                Allergies  Allergen Reactions   Wellbutrin [Bupropion] Hives   Cefepime Rash    Tolerates penicilllin   Ciprofloxacin Hcl Hives and Rash    Hives/rash at injection site; 01/15/22 tolerated IV cipro   Tape Rash    Consultations: Neurology, PCCM, ID, podiatry, cardiology, palliative care, nephrology.   Procedures/Studies: DG CHEST PORT 1 VIEW  Result Date: 01/24/2022 CLINICAL DATA:  Code stroke.  Hypoxia. EXAM: PORTABLE CHEST 1 VIEW COMPARISON:  January 19, 2022 FINDINGS: Feeding catheter tip collimated off the image. High density material  within the stomach. Left IJ approach central venous catheter tip at the expected location of the cavoatrial junction. Cardiomediastinal silhouette is normal. Mediastinal contours appear intact. There is no evidence of focal airspace consolidation, pleural effusion or pneumothorax. Osseous structures are without acute abnormality. Soft tissues are grossly normal. IMPRESSION: No active disease. Electronically Signed   By: Fidela Salisbury M.D.   On: 01/24/2022 19:48   DG Swallowing Func-Speech Pathology  Result Date: 01/21/2022 Table formatting from the original result was not included. Objective Swallowing Evaluation: Type of Study: MBS-Modified Barium Swallow Study  Patient Details Name: Khanh Cordner MRN: [id number] Date of Birth: [birth year] Today's Date: 01/21/2022 Time: SLP Start Time (ACUTE ONLY): 1400 -SLP Stop Time (ACUTE ONLY): 1424 SLP Time Calculation (min) (ACUTE ONLY): 24 min Past Medical History: Past Medical History: Diagnosis Date  Anemia   CAD (coronary artery disease)   a. s/p cath in 03/2014 showing 30% mid-LAD, moderate to severe disease along small D1, patent LCx, moderate to severe distal OM2 stenosis and moderate diffuse diease along RCA not amenable to PCI  CHF (congestive heart failure) ([place])   a. EF 55-60% in 12/2019 b. EF at 35-40% by echo in 05/2020  CKD (chronic kidney disease) stage 4, GFR 15-29 ml/min (HCC)   Diabetes mellitus without complication ([place])   Myocardial infarction ([place])   Neuropathy   Stroke [facility])  Past Surgical History: Past Surgical History: Procedure Laterality Date  AMPUTATION Left 09/02/2021  Procedure: AMPUTATION RAY;  Surgeon: Criselda Peaches, DPM;  Location: [facility]  OR;  Service: Podiatry;  Laterality: Left;  sagittal saw, 3L bag saline & Pulse  Cardiac catherization    CHOLECYSTECTOMY    I & D EXTREMITY Left 08/30/2021  Procedure: IRRIGATION AND DEBRIDEMENT WITH BONE BIOPSY;  Surgeon: [name], DPM;  Location: [place];  Service: Podiatry;  Laterality: Left;  IR  ANGIO VERTEBRAL SEL SUBCLAVIAN INNOMINATE UNI L MOD SED  01/18/2022  IR CT HEAD LTD  01/08/2022  IR INTRA CRAN STENT  01/08/2022  IR PERCUTANEOUS ART THROMBECTOMY/INFUSION INTRACRANIAL INC DIAG ANGIO  01/08/2022  IRRIGATION AND DEBRIDEMENT FOOT Left 09/04/2021  Procedure: IRRIGATION AND DEBRIDEMENT FOOT AND CLOSURE;  Surgeon: Criselda Peaches, DPM;  Location: [place];  Service: Podiatry;  Laterality: Left;  RADIOLOGY WITH ANESTHESIA N/A 01/08/2022  Procedure: IR WITH ANESTHESIA;  Surgeon: Radiologist, Medication, MD;  Location: [place];  Service: Radiology;  Laterality: N/A;  THROMBECTOMY BRACHIAL ARTERY Right 11/18/2021  Procedure: RIGHT BRACHIAL, RADIAL, & ULNAR ARTERY THROMBECTOMY.;  Surgeon: Marty Heck, MD;  Location: [facility] OR;  Service: Vascular;  Laterality: Right;  TUBAL LIGATION   HPI: Jennice Renegar is a 41 y.o. female who presented to the ED for acute onset of chest pain with left side weakness. CT head right MCA and vertebral stroke; underwent IR thrombectomy of R ICA with R ICA dissection repair and stent placement with TICI 3. Intubated 9/8-10.  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 She had to be reintubated on 01/15/22 and extubated 01/18/22.  Subjective: alert for brief moments  Recommendations for follow up therapy are one component of a multi-disciplinary discharge planning process, led by the attending physician.  Recommendations may be updated based on patient status, additional functional criteria and insurance authorization. Assessment / Plan / Recommendation   01/21/2022   3:21 PM Clinical Impressions Clinical Impression Pt presents with oropharyngeal dysphagia characterized by reduced labial seal, weak lingual  manipulation, impaired posterior propulsion, impaired mastication, reduced bolus cohesion, reduced tongue base retraction, and a pharyngeal delay. She demonstrated left-sided anterior spillage of liquids, prolonged mastication, oral residue, delayed oral transit with intermittent lingual pumping, premature spillage, , mild vallecular residue, and swallow initiation with liquids at the level of the valleculae and pyriform sinuses. Penetration (PAS 3, 5) was noted with thin and occasionally with nectar thick liquids. Penetration often progressed to aspiration (PAS 7) with thin liquids and once with consecutive swallows of nectar thick liquids. Pt demonstrated a strong cough in response to larger amounts of aspirated material which appeared effective in expelling the aspirant. Laryngeal invasion was secondary to impaired timing of laryngeal closure. A chin tuck posture was ineffective in eliminating laryngeal invasion of liquids, and pt exhibited difficulty performing this or prompted coughing to attempt expulsion of penetrated material. A dysphagia 2 diet with nectar thick liquids is recommended at this time with observance of swallowing precautions. SLP will continue to follow pt. SLP Visit Diagnosis Dysphagia, pharyngeal phase (R13.13) Impact on safety and function Mild aspiration risk     01/21/2022   3:21 PM Treatment Recommendations Treatment Recommendations Therapy as outlined in treatment plan below     01/21/2022   3:21 PM Prognosis Prognosis for Safe Diet Advancement Good Barriers to Reach Goals Cognitive deficits   01/21/2022   3:21 PM Diet  Recommendations SLP Diet Recommendations Dysphagia 2 (Fine chop) solids;Nectar thick liquid Liquid Administration via Cup;Straw Medication Administration Crushed with puree Compensations Slow rate;Small sips/bites;Follow solids with liquid Postural Changes Seated upright at 90 degrees     01/21/2022   3:21 PM Other Recommendations Oral Care Recommendations Oral care BID  Follow Up Recommendations Acute inpatient rehab (3hours/day) Assistance recommended at discharge Frequent or constant Supervision/Assistance Functional Status Assessment Patient has had a recent decline in their functional status and demonstrates the ability to make significant improvements in function in a reasonable and predictable amount of time.   01/21/2022   3:21 PM Frequency and Duration  Speech Therapy Frequency (ACUTE ONLY) min 2x/week Treatment Duration 2 weeks     01/21/2022   3:21 PM Oral Phase Oral Phase Impaired Oral - Honey Cup Weak lingual manipulation;Lingual pumping;Left anterior bolus loss;Reduced posterior propulsion;Delayed oral transit;Lingual/palatal residue;Decreased bolus cohesion;Premature spillage Oral - Nectar Cup Weak lingual manipulation;Lingual pumping;Left anterior bolus loss;Reduced posterior propulsion;Delayed oral transit;Lingual/palatal residue;Decreased bolus cohesion;Premature spillage Oral - Nectar Straw Weak lingual manipulation;Lingual pumping;Left anterior bolus loss;Reduced posterior propulsion;Delayed oral transit;Lingual/palatal residue;Decreased bolus cohesion;Premature spillage Oral - Thin Cup Weak lingual manipulation;Lingual pumping;Left anterior bolus loss;Reduced posterior propulsion;Delayed oral transit;Lingual/palatal residue;Decreased bolus cohesion;Premature spillage Oral - Thin Straw Weak lingual manipulation;Lingual pumping;Left anterior bolus loss;Reduced posterior propulsion;Delayed oral transit;Lingual/palatal residue;Decreased bolus cohesion;Premature spillage Oral - Puree Weak lingual manipulation;Lingual pumping;Left anterior bolus loss;Reduced posterior propulsion;Delayed oral transit;Lingual/palatal residue;Decreased bolus cohesion;Premature spillage Oral - Mech Soft Lingual pumping;Left anterior bolus loss;Reduced posterior propulsion;Delayed oral transit;Lingual/palatal residue;Decreased bolus cohesion;Premature spillage;Impaired mastication;Weak  lingual manipulation Oral - Regular Weak lingual manipulation;Lingual pumping;Left anterior bolus loss;Reduced posterior propulsion;Delayed oral transit;Lingual/palatal residue;Decreased bolus cohesion;Premature spillage;Impaired mastication Oral - Pill Weak lingual manipulation;Lingual pumping;Left anterior bolus loss;Reduced posterior propulsion;Delayed oral transit;Lingual/palatal residue;Decreased bolus cohesion;Premature spillage;Impaired mastication    01/21/2022   3:21 PM Pharyngeal Phase Pharyngeal Phase Impaired Pharyngeal- Honey Cup Delayed swallow initiation-vallecula;Reduced tongue base retraction;Pharyngeal residue - valleculae Pharyngeal- Nectar Cup Reduced tongue base retraction;Pharyngeal residue - valleculae;Delayed swallow initiation-pyriform sinuses;Penetration/Aspiration during swallow Pharyngeal Material enters airway, remains ABOVE vocal cords and not ejected out Pharyngeal- Nectar Straw Reduced tongue base retraction;Pharyngeal residue - valleculae;Delayed swallow initiation-pyriform sinuses;Penetration/Aspiration during swallow Pharyngeal Material enters airway, remains ABOVE vocal cords and not ejected out Pharyngeal- Thin Cup Reduced tongue base retraction;Pharyngeal residue - valleculae;Delayed swallow initiation-pyriform sinuses;Penetration/Aspiration during swallow Pharyngeal Material enters airway, remains ABOVE vocal cords and not ejected out;Material enters airway, CONTACTS cords and not ejected out Pharyngeal- Thin Straw Reduced tongue base retraction;Pharyngeal residue - valleculae;Delayed swallow initiation-pyriform sinuses;Penetration/Aspiration during swallow Pharyngeal Material enters airway, remains ABOVE vocal cords and not ejected out;Material enters airway, CONTACTS cords and not ejected out Pharyngeal- Puree Reduced tongue base retraction;Pharyngeal residue - valleculae;Delayed swallow initiation-vallecula Pharyngeal- Mechanical Soft Reduced tongue base retraction;Pharyngeal  residue - valleculae;Delayed swallow initiation-vallecula Pharyngeal- Regular Reduced tongue base retraction;Pharyngeal residue - valleculae;Delayed swallow initiation-vallecula Pharyngeal- Pill Reduced tongue base retraction;Pharyngeal residue - valleculae;Delayed swallow initiation-vallecula    01/21/2022   3:21 PM Cervical Esophageal Phase  Cervical Esophageal Phase [facility] Shanika I. Hardin Negus, [place], [place] Office number [phone] Horton Marshall 01/21/2022, 3:39 PM                     DG Chest Port 1 View  Result Date: 01/19/2022 CLINICAL DATA:  [id number].  Pneumonia. EXAM: PORTABLE CHEST 1 VIEW COMPARISON:  Portable chest yesterday at 5:38 a.m. FINDINGS: 5:27 a.m. Interval extubation. Feeding tube again extends well into the stomach with the tip not included  in the exam. Left IJ central line terminates just below the superior cavoatrial junction. There is overlying monitor wiring. Today there is continued improvement in opacities in the right infrahilar area with near resolution. No focal infiltrate is seen on the left or elsewhere on the right. No pleural effusion is evident. Heart size and vasculature are normal. There is no pneumothorax. IMPRESSION: Right infrahilar opacities are nearly resolved.  No new abnormality. Electronically Signed   By: Telford Nab M.D.   On: 01/19/2022 07:21   IR PERCUTANEOUS ART THROMBECTOMY/INFUSION INTRACRANIAL INC DIAG ANGIO  Result Date: 01/18/2022 INDICATION: New onset left-sided weakness and right gaze deviation. 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 EXAM: 1. EMERGENT LARGE VESSEL OCCLUSION THROMBOLYSIS (anterior CIRCULATION) COMPARISON:  CTA head and neck January 08, 2022. MEDICATIONS: Ancef 2 g IV antibiotic was administered within 1 hour of the procedure. ANESTHESIA/SEDATION: General anesthesia. CONTRAST:  Omnipaque 300 approximately 300 mL. FLUOROSCOPY TIME:  Fluoroscopy Time: 134 minutes 18  seconds (6114 mGy). COMPLICATIONS: None immediate. TECHNIQUE: Following a full explanation of the procedure along with the potential associated complications, an informed witnessed consent was obtained. The risks of intracranial hemorrhage of 10%, worsening neurological deficit, ventilator dependency, death and inability to revascularize were all reviewed in detail with the patient's husband. The patient was then put under general anesthesia by the Department of Anesthesiology at [facility]. The right groin was prepped and draped in the usual sterile fashion. Thereafter using modified Seldinger technique, transfemoral access into the right common femoral artery was obtained without difficulty. Over a 0.035 inch guidewire an 8 French 25 cm pinnacle sheath was inserted. Through this, and also over a 0.035 inch guidewire a JB 1 catheter was advanced to the aortic arch region, and selectively positioned in the left vertebral artery, and the right common carotid artery. FINDINGS: Left vertebral artery origin is widely patent. The vessel is seen to opacify normally to the cranial skull base. Normal opacification is seen of the left vertebrobasilar junction, the posterior-inferior cerebellar artery, the basilar artery, the posterior cerebral arteries, superior cerebellar arteries and the anterior-inferior cerebellar arteries into the capillary and venous phases. Retrograde opacification of the right vertebrobasilar junction to the right posterior-inferior cerebellar artery results evident. Also noted is retrograde opacification via the right posterior communicating artery and the right middle cerebral artery distribution. No gross occlusions were evident in the right MCA distribution. Delayed images demonstrate opacification of the anterior cerebral arteries bilaterally. The innominate arteriogram demonstrates non dominant vertebral artery on the right opacifies to the cranial skull base. The right common carotid  arteriogram demonstrates aspiration of the right external carotid artery branches. The right internal carotid artery demonstrates a tapered occlusion distal to the bulb of the proximal 1/3. There is a degree of opacification of the distal cavernous right internal carotid artery with complete occlusion just distal to this. PROCEDURE: The diagnostic JB 1 catheter in the right common carotid artery was exchanged over a 0.035 inch 300 cm Rosen exchange guidewire for an 087 95 mm balloon guide catheter which was positioned in the right common carotid artery bifurcation. Guidewire was removed. Good aspiration was obtained from the hub of the balloon guide catheter. A gentle control arteriogram performed through the balloon guide catheter demonstrated occlusion of the proximal right internal carotid artery. Over a 0.014 inch soft tip Aristotle guidewire, an 021 micro catheter was gently advanced through the occluded right internal carotid artery with intermittent injections to ensure safe positioning of the microcatheter  within the lumen of the vessel. These demonstrated multiple focal areas of prominent filling defects within the right internal carotid artery extending to the supraclinoid right ICA. Access was obtained into the proximal right middle cerebral artery. The guidewire was removed. Good aspiration obtained from the hub of the microcatheter. A gentle arteriogram through the microcatheter demonstrated safe positioning of the tip of the microcatheter. A 4 mm x 40 mm Solitaire X retrieval device was then advanced to the distal end of the microcatheter. This was then deployed in the usual manner unsheathing with the distal portion of the retrieval device in the supraclinoid right ICA. With proximal flow arrest and the aspiration at the hub of the balloon guide catheter in the proximal right internal carotid artery, the retrieval device was retrieved and removed with the microcatheter. Following reversal of flow  arrest, gentle control arteriogram performed demonstrated string like opacification to the cranial skull base. A second pass was then using the above combination using a 5 mm x 37 mm Embotrap retrieval device with proximal aspiration. Following retrieval of the device and the microcatheter of flow arrest, a control arteriogram performed through the balloon guide catheter demonstrated slow taper opacification to the level of the right ophthalmic artery. Evident now was a long segment dissection involving the middle 1/3 of the right internal carotid artery and extending into the petrous segment. Again demonstrated were areas of defects consistent with clots. A third pass was made using a 6.5 mm x 45 mm Embotrap retrieval device advanced over a microcatheter inside of an 071 Zoom aspiration catheter. This was advanced to the proximal cavernous segment with a microcatheter in the supraclinoid right ICA. The retrieval device was then deployed in the usual manner with aspiration using Penumbra aspiration pump, and the 20 mL syringe at the hub of the balloon guide catheter. The combination was retrieved and removed. Chunks of clot evident in the retrieval device, and also the Zoom aspiration catheter. A control arteriogram performed through the balloon catheter now demonstrated opacification of the middle and distal 1/3 of the right internal carotid artery with tapered narrowing with intimal flap noted in the petrous cavernous segment, and also in the cervical petrous junction. Distal to this intraluminal filling defects were evident in the petrous, cavernous and the distal cavernous segments. Trickle flow was noted into the right middle cerebral artery territory. A fourth pass was made at this time using a 6 French 132 cm Catalyst guide catheter with an 021 microcatheter advanced to the middle cerebral artery M1 segment. After having ascertained safe positioning of the tip of the microcatheter, the 6.5 mm x 45 mm  retrieval device was advanced from the proximal right M1 segment to the cavernous segment. The Catalyst guide catheter was advanced into the petrous cavernous junction. Again proximal flow arrest with constant aspiration was applied at the hub of the Catalyst guide catheter and also the balloon guide catheter. Combination was retrieved and removed. Again multiple chunks of clot were obtained. A control arteriogram performed following reversal of flow arrest now demonstrated brisk flow into the distal right internal carotid artery in the middle cerebral artery. Mixing of unopacified blood was noted in the right middle cerebral artery from the posterior circulation as described. Also noted was smooth filling defect in the petrous cavernous junction and also intimal pre occlusive flap in the horizontal petrous segment. Also noted was severe narrowing cerebral petrous junction limiting antegrade flow. Through the balloon guide catheter in the proximal right internal carotid artery,  a microcatheter was advanced to the proximal cavernous segment. Guidewire was removed. Good aspiration obtained from the hub of the 027 microcatheter which was connected to continuous heparinized saline infusion. Telescoping flow diverters were then deployed from the distal petrous segment to the mid right internal carotid artery. Device used was a 16 mm pipeline flex flow diverter device, a 4.2 flex flow diversion device and a 4.75 mm x 20 mm device. A control arteriogram was performed following this continued to demonstrate brisk antegrade flow into the limited distal portion. An 021 microcatheter was then advanced over an 014 inch micro guidewire without difficulty through the stent construct positioned in the supraclinoid right ICA. The guidewire was removed. Good aspiration obtained from hub of the microcatheter. A 4 mm x 24 mm Neuroform atlas stent was then deployed from the proximal cavernous segment to the proximal petrous segment. A  control arteriogram performed through the balloon guide catheter demonstrated antegrade flow through the right internal carotid artery extra cranially and intracranially with brisk flow noted into the petrous, the cavernous and the supraclinoid segments. A focal area of narrowing noted at the horizontal petrous segment treated with angioplasty with significantly improved caliber. Brisk flow was then into the right middle cerebral artery maintaining a TICI 2C revascularization. Unopacified blood was also noted in the right anterior cerebral A1 segment. The final control arteriogram performed through the balloon guide catheter in the right common carotid artery demonstrated free flow proximally and distally intracranially as described above. An 8 French Angio-Seal closure device was deployed for hemostasis at the right groin puncture site. A CT of the brain demonstrated no hemorrhagic complications. Patient was left intubated and transferred to the neuro ICU for post revascularization care. Medications utilized: Aspirin 81 mg, and Brilinta 180 mg via orogastric tube. IV Cangrelor half bolus dose followed by 4 hr infusion. 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 PLAN: Follow-up in the clinic 4 weeks post discharge. Electronically Signed   By: Luanne Bras M.D.   On: 01/18/2022 08:34   IR CT Head Ltd  Result Date: 01/18/2022 INDICATION: New onset left-sided weakness and right gaze deviation. Occluded right internal carotid artery extra cranially and  intracranially. EXAM: 1. EMERGENT LARGE VESSEL OCCLUSION THROMBOLYSIS (anterior CIRCULATION) COMPARISON:  CTA head and neck January 08, 2022. MEDICATIONS: Ancef 2 g IV antibiotic was administered within 1 hour of the procedure. ANESTHESIA/SEDATION: General anesthesia. CONTRAST:  Omnipaque 300 approximately 300 mL. FLUOROSCOPY TIME:  Fluoroscopy Time: 134 minutes 18 seconds (6114 mGy). COMPLICATIONS: None immediate. TECHNIQUE: Following a full explanation of the procedure along with the potential associated complications, an informed witnessed consent was obtained. The risks of intracranial hemorrhage of 10%, worsening neurological deficit, ventilator dependency, death and inability to revascularize were all reviewed in detail with the patient's husband. The patient was then put under general anesthesia by the Department of Anesthesiology at [facility]. The right groin was prepped and draped in the usual sterile fashion. Thereafter using modified Seldinger technique, transfemoral access into the right common femoral artery was obtained without difficulty. Over a 0.035 inch guidewire an 8 French 25 cm pinnacle sheath was inserted. Through this, and  also over a 0.035 inch guidewire a JB 1 catheter was advanced to the aortic arch region, and selectively positioned in the left vertebral artery, and the right common carotid artery. FINDINGS: Left vertebral artery origin is widely patent. The vessel is seen to opacify normally to the cranial skull base. Normal opacification is seen of the left vertebrobasilar junction, the posterior-inferior cerebellar artery, the basilar artery, the posterior cerebral arteries, superior cerebellar arteries and the anterior-inferior cerebellar arteries into the capillary and venous phases. Retrograde opacification of the right vertebrobasilar junction to the right posterior-inferior cerebellar artery results evident. Also noted is retrograde opacification via the right posterior  communicating artery and the right middle cerebral artery distribution. No gross occlusions were evident in the right MCA distribution. Delayed images demonstrate opacification of the anterior cerebral arteries bilaterally. The innominate arteriogram demonstrates non dominant vertebral artery on the right opacifies to the cranial skull base. The right common carotid arteriogram demonstrates aspiration of the right external carotid artery branches. The right internal carotid artery demonstrates a tapered occlusion distal to the bulb of the proximal 1/3. There is a degree of opacification of the distal cavernous right internal carotid artery with complete occlusion just distal to this. PROCEDURE: The diagnostic JB 1 catheter in the right common carotid artery was exchanged over a 0.035 inch 300 cm Rosen exchange guidewire for an 087 95 mm balloon guide catheter which was positioned in the right common carotid artery bifurcation. Guidewire was removed. Good aspiration was obtained from the hub of the balloon guide catheter. A gentle control arteriogram performed through the balloon guide catheter demonstrated occlusion of the proximal right internal carotid artery. Over a 0.014 inch soft tip Aristotle guidewire, an 021 micro catheter was gently advanced through the occluded right internal carotid artery with intermittent injections to ensure safe positioning of the microcatheter within the lumen of the vessel. These demonstrated multiple focal areas of prominent filling defects within the right internal carotid artery extending to the supraclinoid right ICA. Access was obtained into the proximal right middle cerebral artery. The guidewire was removed. Good aspiration obtained from the hub of the microcatheter. A gentle arteriogram through the microcatheter demonstrated safe positioning of the tip of the microcatheter. A 4 mm x 40 mm Solitaire X retrieval device was then advanced to the distal end of the microcatheter.  This was then deployed in the usual manner unsheathing with the distal portion of the retrieval device in the supraclinoid right ICA. With proximal flow arrest and the aspiration at the hub of the balloon guide catheter in the proximal right internal carotid artery, the retrieval device was retrieved and removed with the microcatheter. Following reversal of flow arrest, gentle control arteriogram performed demonstrated string like opacification to the cranial skull base. A second pass was then using the above combination using a 5 mm x 37 mm Embotrap retrieval device with proximal aspiration. Following retrieval of the device and the microcatheter of flow arrest, a control arteriogram performed through the balloon guide catheter demonstrated slow taper opacification to the level of the right ophthalmic artery. Evident now was a long segment dissection involving the middle 1/3 of the right internal carotid artery and extending into the petrous segment. Again demonstrated were areas of defects consistent with clots. A third pass was made using a 6.5 mm x 45 mm Embotrap retrieval device advanced over a microcatheter inside of an 071 Zoom aspiration catheter. This was advanced to the proximal cavernous segment with a microcatheter in the supraclinoid right  ICA. The retrieval device was then deployed in the usual manner with aspiration using Penumbra aspiration pump, and the 20 mL syringe at the hub of the balloon guide catheter. The combination was retrieved and removed. Chunks of clot evident in the retrieval device, and also the Zoom aspiration catheter. A control arteriogram performed through the balloon catheter now demonstrated opacification of the middle and distal 1/3 of the right internal carotid artery with tapered narrowing with intimal flap noted in the petrous cavernous segment, and also in the cervical petrous junction. Distal to this intraluminal filling defects were evident in the petrous, cavernous and  the distal cavernous segments. Trickle flow was noted into the right middle cerebral artery territory. A fourth pass was made at this time using a 6 French 132 cm Catalyst guide catheter with an 021 microcatheter advanced to the middle cerebral artery M1 segment. After having ascertained safe positioning of the tip of the microcatheter, the 6.5 mm x 45 mm retrieval device was advanced from the proximal right M1 segment to the cavernous segment. The Catalyst guide catheter was advanced into the petrous cavernous junction. Again proximal flow arrest with constant aspiration was applied at the hub of the Catalyst guide catheter and also the balloon guide catheter. Combination was retrieved and removed. Again multiple chunks of clot were obtained. A control arteriogram performed following reversal of flow arrest now demonstrated brisk flow into the distal right internal carotid artery in the middle cerebral artery. Mixing of unopacified blood was noted in the right middle cerebral artery from the posterior circulation as described. Also noted was smooth filling defect in the petrous cavernous junction and also intimal pre occlusive flap in the horizontal petrous segment. Also noted was severe narrowing cerebral petrous junction limiting antegrade flow. Through the balloon guide catheter in the proximal right internal carotid artery, a microcatheter was advanced to the proximal cavernous segment. Guidewire was removed. Good aspiration obtained from the hub of the 027 microcatheter which was connected to continuous heparinized saline infusion. Telescoping flow diverters were then deployed from the distal petrous segment to the mid right internal carotid artery. Device used was a 16 mm pipeline flex flow diverter device, a 4.2 flex flow diversion device and a 4.75 mm x 20 mm device. A control arteriogram was performed following this continued to demonstrate brisk antegrade flow into the limited distal portion. An 021  microcatheter was then advanced over an 014 inch micro guidewire without difficulty through the stent construct positioned in the supraclinoid right ICA. The guidewire was removed. Good aspiration obtained from hub of the microcatheter. A 4 mm x 24 mm Neuroform atlas stent was then deployed from the proximal cavernous segment to the proximal petrous segment. A control arteriogram performed through the balloon guide catheter demonstrated antegrade flow through the right internal carotid artery extra cranially and intracranially with brisk flow noted into the petrous, the cavernous and the supraclinoid segments. A focal area of narrowing noted at the horizontal petrous segment treated with angioplasty with significantly improved caliber. Brisk flow was then into the right middle cerebral artery maintaining a TICI 2C revascularization. Unopacified blood was also noted in the right anterior cerebral A1 segment. The final control arteriogram performed through the balloon guide catheter in the right common carotid artery demonstrated free flow proximally and distally intracranially as described above. An 8 French Angio-Seal closure device was deployed for hemostasis at the right groin puncture site. A CT of the brain demonstrated no hemorrhagic complications. Patient was left intubated  and transferred to the neuro ICU for post revascularization care. Medications utilized: Aspirin 81 mg, and Brilinta 180 mg via orogastric tube. IV Cangrelor half bolus dose followed by 4 hr infusion. 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 PLAN: Follow-up in the clinic 4 weeks post discharge. Electronically Signed   By: Luanne Bras M.D.   On: 01/18/2022 08:34   IR Intra Cran Stent  Result Date: 01/18/2022 INDICATION: New onset left-sided weakness and right gaze deviation. 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 EXAM: 1. EMERGENT LARGE VESSEL OCCLUSION THROMBOLYSIS (anterior CIRCULATION) COMPARISON:  CTA head and neck January 08, 2022. MEDICATIONS: Ancef 2 g IV antibiotic was administered within 1 hour of the procedure. ANESTHESIA/SEDATION: General anesthesia. CONTRAST:  Omnipaque 300 approximately 300 mL. FLUOROSCOPY TIME:  Fluoroscopy Time: 134 minutes 18 seconds (6114 mGy). COMPLICATIONS: None immediate. TECHNIQUE: Following a full explanation of the procedure along with the potential associated complications, an informed witnessed consent was obtained. The risks of intracranial hemorrhage of 10%, worsening neurological deficit, ventilator dependency, death and inability to revascularize were all reviewed in detail with the patient's husband. The patient was then put under general anesthesia by the Department of Anesthesiology at [facility]. The right groin was prepped and draped in the usual sterile fashion. Thereafter using modified Seldinger technique, transfemoral access into the right common femoral artery was obtained without difficulty. Over a 0.035 inch guidewire an 8 French 25 cm pinnacle sheath was inserted. Through this, and also over a 0.035 inch guidewire a JB 1 catheter was advanced to the aortic arch region, and selectively positioned in the left vertebral artery, and the right common carotid artery. FINDINGS: Left vertebral artery origin is widely patent. The vessel is seen to opacify normally to the cranial skull base. Normal opacification is seen of the left vertebrobasilar junction, the posterior-inferior  cerebellar artery, the basilar artery, the posterior cerebral arteries, superior cerebellar arteries and the anterior-inferior cerebellar arteries into the capillary and venous phases. Retrograde opacification of the right vertebrobasilar junction to the right posterior-inferior cerebellar artery results evident. Also noted is retrograde opacification via the right posterior communicating artery and the right middle cerebral artery distribution. No gross occlusions were evident in the right MCA distribution. Delayed images demonstrate opacification of the anterior cerebral arteries bilaterally. The innominate arteriogram demonstrates non dominant vertebral artery on the right opacifies to the cranial skull base. The right common carotid arteriogram demonstrates aspiration of the right external carotid artery branches. The right internal carotid artery demonstrates a tapered occlusion distal to the bulb of the proximal 1/3. There is a degree of opacification of the distal cavernous right internal carotid artery with complete occlusion just distal to this. PROCEDURE: The diagnostic JB 1 catheter in the right common carotid artery was exchanged over a 0.035 inch 300 cm Rosen exchange guidewire for an 087 95 mm balloon guide catheter which was positioned in the right common carotid artery bifurcation. Guidewire was removed. Good aspiration was obtained from the hub of the balloon guide catheter. A gentle control arteriogram performed through the balloon guide catheter demonstrated occlusion of the proximal right internal carotid artery. Over a  0.014 inch soft tip Aristotle guidewire, an 021 micro catheter was gently advanced through the occluded right internal carotid artery with intermittent injections to ensure safe positioning of the microcatheter within the lumen of the vessel. These demonstrated multiple focal areas of prominent filling defects within the right internal carotid artery extending to the supraclinoid  right ICA. Access was obtained into the proximal right middle cerebral artery. The guidewire was removed. Good aspiration obtained from the hub of the microcatheter. A gentle arteriogram through the microcatheter demonstrated safe positioning of the tip of the microcatheter. A 4 mm x 40 mm Solitaire X retrieval device was then advanced to the distal end of the microcatheter. This was then deployed in the usual manner unsheathing with the distal portion of the retrieval device in the supraclinoid right ICA. With proximal flow arrest and the aspiration at the hub of the balloon guide catheter in the proximal right internal carotid artery, the retrieval device was retrieved and removed with the microcatheter. Following reversal of flow arrest, gentle control arteriogram performed demonstrated string like opacification to the cranial skull base. A second pass was then using the above combination using a 5 mm x 37 mm Embotrap retrieval device with proximal aspiration. Following retrieval of the device and the microcatheter of flow arrest, a control arteriogram performed through the balloon guide catheter demonstrated slow taper opacification to the level of the right ophthalmic artery. Evident now was a long segment dissection involving the middle 1/3 of the right internal carotid artery and extending into the petrous segment. Again demonstrated were areas of defects consistent with clots. A third pass was made using a 6.5 mm x 45 mm Embotrap retrieval device advanced over a microcatheter inside of an 071 Zoom aspiration catheter. This was advanced to the proximal cavernous segment with a microcatheter in the supraclinoid right ICA. The retrieval device was then deployed in the usual manner with aspiration using Penumbra aspiration pump, and the 20 mL syringe at the hub of the balloon guide catheter. The combination was retrieved and removed. Chunks of clot evident in the retrieval device, and also the Zoom aspiration  catheter. A control arteriogram performed through the balloon catheter now demonstrated opacification of the middle and distal 1/3 of the right internal carotid artery with tapered narrowing with intimal flap noted in the petrous cavernous segment, and also in the cervical petrous junction. Distal to this intraluminal filling defects were evident in the petrous, cavernous and the distal cavernous segments. Trickle flow was noted into the right middle cerebral artery territory. A fourth pass was made at this time using a 6 French 132 cm Catalyst guide catheter with an 021 microcatheter advanced to the middle cerebral artery M1 segment. After having ascertained safe positioning of the tip of the microcatheter, the 6.5 mm x 45 mm retrieval device was advanced from the proximal right M1 segment to the cavernous segment. The Catalyst guide catheter was advanced into the petrous cavernous junction. Again proximal flow arrest with constant aspiration was applied at the hub of the Catalyst guide catheter and also the balloon guide catheter. Combination was retrieved and removed. Again multiple chunks of clot were obtained. A control arteriogram performed following reversal of flow arrest now demonstrated brisk flow into the distal right internal carotid artery in the middle cerebral artery. Mixing of unopacified blood was noted in the right middle cerebral artery from the posterior circulation as described. Also noted was smooth filling defect in the petrous cavernous junction and also intimal pre  occlusive flap in the horizontal petrous segment. Also noted was severe narrowing cerebral petrous junction limiting antegrade flow. Through the balloon guide catheter in the proximal right internal carotid artery, a microcatheter was advanced to the proximal cavernous segment. Guidewire was removed. Good aspiration obtained from the hub of the 027 microcatheter which was connected to continuous heparinized saline infusion.  Telescoping flow diverters were then deployed from the distal petrous segment to the mid right internal carotid artery. Device used was a 16 mm pipeline flex flow diverter device, a 4.2 flex flow diversion device and a 4.75 mm x 20 mm device. A control arteriogram was performed following this continued to demonstrate brisk antegrade flow into the limited distal portion. An 021 microcatheter was then advanced over an 014 inch micro guidewire without difficulty through the stent construct positioned in the supraclinoid right ICA. The guidewire was removed. Good aspiration obtained from hub of the microcatheter. A 4 mm x 24 mm Neuroform atlas stent was then deployed from the proximal cavernous segment to the proximal petrous segment. A control arteriogram performed through the balloon guide catheter demonstrated antegrade flow through the right internal carotid artery extra cranially and intracranially with brisk flow noted into the petrous, the cavernous and the supraclinoid segments. A focal area of narrowing noted at the horizontal petrous segment treated with angioplasty with significantly improved caliber. Brisk flow was then into the right middle cerebral artery maintaining a TICI 2C revascularization. Unopacified blood was also noted in the right anterior cerebral A1 segment. The final control arteriogram performed through the balloon guide catheter in the right common carotid artery demonstrated free flow proximally and distally intracranially as described above. An 8 French Angio-Seal closure device was deployed for hemostasis at the right groin puncture site. A CT of the brain demonstrated no hemorrhagic complications. Patient was left intubated and transferred to the neuro ICU for post revascularization care. Medications utilized: Aspirin 81 mg, and Brilinta 180 mg via orogastric tube. IV Cangrelor half bolus dose followed by 4 hr infusion. IMPRESSION: Status post endovascular complete revascularization of  occluded right internal carotid artery extra cranially and intracranially secondary to long segment dissection treated with stent retrieval and contact aspiration x 4 as described above followed by reconstruct of the dissected right internal carotid artery extra cranially and intracranially with telescoping flow diverters, and Neuroform stent, obtaining a TICI 2C revascularization of the right middle cerebral artery territory. PLAN: Follow-up in the clinic 4 weeks post discharge. Electronically Signed   By: Luanne Bras M.D.   On: 01/18/2022 08:34   DG CHEST PORT 1 VIEW  Result Date: 01/18/2022 CLINICAL DATA:  Ventilator patient. EXAM: PORTABLE CHEST 1 VIEW COMPARISON:  Prior chest radiographs, most recently done [id number]. CT 11/09/2008. FINDINGS: 0538 hours. Endotracheal tube, left IJ central venous catheter and feeding tube appear unchanged in position. The heart size and mediastinal contours are stable. Mild residual interstitial opacities are present in both lung bases, little changed from yesterday, but improved from 3 days ago. No evidence of pneumothorax or significant pleural effusion. Telemetry leads overlie the chest. IMPRESSION: Stable residual interstitial opacities at both lung bases. Unchanged support system. Electronically Signed   By: Richardean Sale M.D.   On: 01/18/2022 08:27   DG CHEST PORT 1 VIEW  Result Date: 01/17/2022 CLINICAL DATA:  Follow-up exam.  Patient on ventilator. EXAM: PORTABLE CHEST 1 VIEW COMPARISON:  01/15/2022 and older studies. FINDINGS: Bilateral interstitial and patchy airspace lung opacities have improved. No new lung abnormalities. No convincing  pleural effusion and no pneumothorax. Endotracheal tube, left internal jugular central venous line and enteric tube are stable. IMPRESSION: 1. Significant interval improvement in lung aeration. Electronically Signed   By: Lajean Manes M.D.   On: 01/17/2022 10:10   DG CHEST PORT 1 VIEW  Result Date:  01/15/2022 CLINICAL DATA:  Stroke, history of CHF. EXAM: PORTABLE CHEST 1 VIEW COMPARISON:  Chest radiograph earlier the same day FINDINGS: The enteric catheter tip courses into the stomach and off the field of view. The left IJ vascular catheter terminates in the expected location of the right atrium. The endotracheal tube tip is approximately 3.2 cm from the carina. Patchy opacities are again seen in both lungs, right worse than left, though overall improved since the study from earlier the same day. There is no pleural effusion or pneumothorax. The bones are stable. IMPRESSION: Improved aeration of both lungs since the study from earlier the same day. Electronically Signed   By: Valetta Mole M.D.   On: 01/15/2022 15:58   DG CHEST PORT 1 VIEW  Result Date: 01/15/2022 CLINICAL DATA:  Shortness of breath EXAM: PORTABLE CHEST 1 VIEW COMPARISON:  Chest radiograph 01/11/2022 FINDINGS: The enteric catheter courses into the stomach and off the field of view. The cardiomediastinal silhouette is grossly stable. There are new extensive opacities throughout both lungs. There is no significant pleural effusion. There is no pneumothorax There is no acute osseous abnormality. IMPRESSION: New extensive opacities throughout both lungs may reflect multifocal infection, pulmonary edema, ARDS, or combination thereof. Electronically Signed   By: Valetta Mole M.D.   On: 01/15/2022 08:30   VAS [name] ABI WITH/WO TBI  Result Date: 01/14/2022  LOWER EXTREMITY DOPPLER STUDY Patient Name:  SIBBIE FLAMMIA  Date of Exam:   01/14/2022 Medical Rec #: [id number]        Accession #:    [id number] Date of Birth: [birth year]        Patient Gender: F Patient Age:   66 years Exam Location:  [facility] Procedure:      VAS [name] ABI WITH/WO TBI Referring Phys: ADAM MCDONALD --------------------------------------------------------------------------------  Indications: Ulceration. 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 High Risk Factors: Hypertension, hyperlipidemia, Diabetes, past history of                    smoking, prior MI, coronary artery disease, prior CVA.  Comparison Study: 08-23-2021 ABI w/ TBI showed RIGHT 0.83/0.66 and LEFT                   1.04/0.36; however, history of non-compressible LEFT ABI on                   02/12/2020. Performing Technologist: Darlin Coco RDMS RVT  Examination Guidelines: A complete evaluation includes at minimum, Doppler waveform signals and systolic blood pressure reading at the level of bilateral brachial, anterior tibial, and posterior tibial arteries, when vessel segments are accessible. Bilateral testing is considered an integral part of a complete examination. Photoelectric Plethysmograph (PPG) waveforms and toe systolic pressure readings are included as required and additional duplex testing as needed. Limited examinations for reoccurring indications may be performed as noted.  ABI Findings: +---------+------------------+-----+---------+--------+ Right    Rt Pressure (mmHg)IndexWaveform Comment  +---------+------------------+-----+---------+--------+ Brachial 127                    triphasic         +---------+------------------+-----+---------+--------+  PTA      180               1.42 biphasic          +---------+------------------+-----+---------+--------+ DP       109               0.86 biphasic          +---------+------------------+-----+---------+--------+ Great Toe62                0.49 Abnormal          +---------+------------------+-----+---------+--------+ +---------+-----------------+-----+-------------------+------------------------+ Left     Lt Pressure      IndexWaveform           Comment                           (mmHg)                                                            +---------+-----------------+-----+-------------------+------------------------+ Brachial 122                                                                +---------+-----------------+-----+-------------------+------------------------+ PTA      103              0.81 dampened monophasicPreviously                                                                 non-compressible         +---------+-----------------+-----+-------------------+------------------------+ DP       106              0.83 monophasic         Previously                                                                 non-compressible         +---------+-----------------+-----+-------------------+------------------------+ Great Toe                                         Amputation               +---------+-----------------+-----+-------------------+------------------------+ +-------+-----------+-----------+------------+------------+ ABI/TBIToday's ABIToday's TBIPrevious ABIPrevious TBI +-------+-----------+-----------+------------+------------+ Right  1.42       0.49       0.83        0.66         +-------+-----------+-----------+------------+------------+ Left   0.83       amputation 1.04        0.36         +-------+-----------+-----------+------------+------------+  Right ABIs appear decreased compared to prior study on 08-23-2021. Left ABIs appear decreased compared to prior study on 08-23-2021.  Summary: Right: Resting right ankle-brachial index indicates noncompressible right lower extremity arteries. The right toe-brachial index is abnormal. Left: Resting left ankle-brachial index indicates noncompressible left lower extremity arteries. Although ankle-brachial indices are within mild (0.80-0.94) range, patient history of non-compressible arteries on previous exams coupled with abnormal, monophasic waveforms suggests progression of arterial disease. *See table(s) above for measurements and observations.  Electronically signed by Harold Barban MD on 01/14/2022 at 11:55:38 PM.    Final    ECHOCARDIOGRAM COMPLETE  Result  Date: 01/14/2022    ECHOCARDIOGRAM REPORT   Patient Name:   BUFFI EWTON Date of Exam: 01/14/2022 Medical Rec #:  [id number]       Height:       65.0 in Accession #:    [id number]      Weight:       197.1 lb Date of Birth:  [birth year]       BSA:          1.966 m Patient Age:    35 years        BP:           139/76 mmHg Patient Gender: F               HR:           87 bpm. Exam Location:  Inpatient Procedure: 2D Echo, Color Doppler, Cardiac Doppler and Intracardiac            Opacification Agent Indications:    Dilated Cardiomyopathy  History:        Patient has prior history of Echocardiogram examinations, most                 recent 01/10/2022. CHF, CAD, Stroke; Risk Factors:Diabetes.  Sonographer:    Memory Argue Referring Phys: [id number] [place]  1. Left ventricular ejection fraction, by estimation, is 35 to 40%. Left ventricular ejection fraction by 2D MOD biplane is 37.1 %. The left ventricle has moderately decreased function. The left ventricle demonstrates regional wall motion abnormalities (see scoring diagram/findings for description). There is mild left ventricular hypertrophy. Left ventricular diastolic parameters are consistent with Grade II diastolic dysfunction (pseudonormalization). Elevated left ventricular end-diastolic pressure. The E/e' is 21. Wall motion abnormality suggestive of LAD territory infarct or less likely Takatsubo cardiomyopathy.  2. Right ventricular systolic function is normal. The right ventricular size is normal. Tricuspid regurgitation signal is inadequate for assessing PA pressure.  3. The mitral valve is abnormal. Trivial mitral valve regurgitation.  4. The inferior vena cava is normal in size with <50% respiratory variability, suggesting right atrial pressure of 8 mmHg.  5. The aortic valve is tricuspid. Aortic valve regurgitation is not visualized. Comparison(s): Changes from prior study are noted. 01/10/2022: LVEF 30-35%, mid to apical ventricular wall  thinning and severe hypokinesis to akinesis. FINDINGS  Left Ventricle: No mural thrombus with Definity contrast. Left ventricular ejection fraction, by estimation, is 35 to 40%. Left ventricular ejection fraction by 2D MOD biplane is 37.1 %. The left ventricle has moderately decreased function. The left ventricle demonstrates regional wall motion abnormalities. Definity contrast agent was given IV to delineate the left ventricular endocardial borders. The left ventricular internal cavity size was normal in size. There is mild left ventricular hypertrophy. Left ventricular diastolic parameters are consistent with Grade II diastolic dysfunction (pseudonormalization). Elevated left ventricular end-diastolic pressure. The E/e' is 1.  LV Wall Scoring: The mid and distal anterior wall, mid and distal lateral wall, mid and distal anterior septum, entire apex, mid and distal inferior wall, mid anterolateral segment, and mid inferoseptal segment are hypokinetic. The basal anteroseptal segment, basal inferolateral segment, basal anterolateral segment, basal anterior segment, basal inferior segment, and basal inferoseptal segment are normal. Right Ventricle: The right ventricular size is normal. No increase in right ventricular wall thickness. Right ventricular systolic function is normal. Tricuspid regurgitation signal is inadequate for assessing PA pressure. Left Atrium: Left atrial size was normal in size. Right Atrium: Right atrial size was normal in size. Pericardium: Trivial pericardial effusion is present. The pericardial effusion is posterior to the left ventricle. Mitral Valve: The mitral valve is abnormal. There is mild thickening of the posterior and anterior mitral valve leaflet(s). Trivial mitral valve regurgitation. Tricuspid Valve: The tricuspid valve is grossly normal. Tricuspid valve regurgitation is trivial. Aortic Valve: The aortic valve is tricuspid. Aortic valve regurgitation is not visualized. Aortic  valve mean gradient measures 3.0 mmHg. Aortic valve peak gradient measures 6.2 mmHg. Aortic valve area, by VTI measures 2.65 cm. Pulmonic Valve: The pulmonic valve was normal in structure. Pulmonic valve regurgitation is not visualized. Aorta: The aortic root and ascending aorta are structurally normal, with no evidence of dilitation. Venous: The inferior vena cava is normal in size with less than 50% respiratory variability, suggesting right atrial pressure of 8 mmHg. IAS/Shunts: No atrial level shunt detected by color flow Doppler.  LEFT VENTRICLE PLAX 2D                        Biplane EF (MOD) LVIDd:         4.83 cm         LV Biplane EF:   Left LVIDs:         3.55 cm                          ventricular LV PW:         1.33 cm                          ejection LV IVS:        1.27 cm                          fraction by LVOT diam:     2.00 cm                          2D MOD LV SV:         72                               biplane is LV SV Index:   37                               37.1 %. LVOT Area:     3.14 cm                                Diastology  LV e' medial:    5.11 cm/s LV Volumes (MOD)               LV E/e' medial:  21.5 LV vol d, MOD    193.0 ml      LV e' lateral:   6.20 cm/s A2C:                           LV E/e' lateral: 17.7 LV vol d, MOD    208.0 ml A4C: LV vol s, MOD    113.0 ml A2C: LV vol s, MOD    131.0 ml A4C: LV SV MOD A2C:   80.0 ml LV SV MOD A4C:   208.0 ml LV SV MOD BP:    77.1 ml RIGHT VENTRICLE RV S prime:     12.10 cm/s LEFT ATRIUM             Index LA diam:        3.95 cm 2.01 cm/m LA Vol (A2C):   66.5 ml 33.83 ml/m LA Vol (A4C):   42.7 ml 21.72 ml/m LA Biplane Vol: 54.7 ml 27.83 ml/m  AORTIC VALVE AV Area (Vmax):    2.51 cm AV Area (Vmean):   2.50 cm AV Area (VTI):     2.65 cm AV Vmax:           124.00 cm/s AV Vmean:          86.000 cm/s AV VTI:            0.271 m AV Peak Grad:      6.2 mmHg AV Mean Grad:      3.0 mmHg LVOT Vmax:         99.20  cm/s LVOT Vmean:        68.300 cm/s LVOT VTI:          0.229 m LVOT/AV VTI ratio: 0.85 MITRAL VALVE MV Area (PHT): 4.63 cm     SHUNTS MV Decel Time: 164 msec     Systemic VTI:  0.23 m MR Peak grad: 46.2 mmHg     Systemic Diam: 2.00 cm MR Vmax:      340.00 cm/s MV E velocity: 110.00 cm/s MV A velocity: 50.90 cm/s MV E/A ratio:  2.16 Lyman Bishop MD Electronically signed by Lyman Bishop MD Signature Date/Time: 01/14/2022/2:49:57 PM    Final    MR BRAIN WO CONTRAST  Result Date: 01/14/2022 CLINICAL DATA:  Neuro deficit, acute, stroke suspected. 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 EXAM: MRI HEAD WITHOUT CONTRAST TECHNIQUE: Multiplanar, multiecho pulse sequences of the brain and surrounding structures were obtained without intravenous contrast. COMPARISON:  Head CT 01/11/2022 and MRI 01/09/2022 FINDINGS: The examination had to be discontinued prior to completion due to patient condition. Axial and coronal diffusion, axial T2* gradient echo, and axial T2 FLAIR sequences were obtained. The FLAIR sequence is severely motion degraded. Numerous patchy infarcts throughout the right MCA territory are unchanged in extent compared to the prior MRI, with the most confluent regions of infarct again noted to be near watershed areas. Small infarcts in the corpus callosum, right cerebral peduncle, and left frontal white matter are also unchanged. No definite new infarct or sizable intracranial hemorrhage is identified. There is no midline shift or hydrocephalus. Chronic white matter disease involving the cerebral hemispheres and brainstem are not well evaluated on this truncated, motion degraded study. IMPRESSION: 1. Motion degraded, incomplete examination. 2. Unchanged numerous  infarcts as above, primarily involving the right MCA territory. 3. No definite new intracranial abnormality. Electronically Signed   By: [name] M.D.   On: 01/14/2022 13:36   DG Abd Portable  1V  Result Date: 01/12/2022 CLINICAL DATA:  Feeding tube placement. EXAM: PORTABLE ABDOMEN - 1 VIEW COMPARISON:  01/10/2022 FINDINGS: Cortrak feeding tube placement with the tip in the region of the distal stomach or proximal duodenum. Visualized bowel gas pattern is nonobstructive. IMPRESSION: Feeding tube tip in the region of the distal stomach/proximal duodenum. Electronically Signed   By: Aletta Edouard M.D.   On: 01/12/2022 11:11   CT HEAD WO CONTRAST (5MM)  Result Date: 01/11/2022 CLINICAL DATA:  Follow-up stroke. Revascularization of cervical ICA occlusion. EXAM: CT HEAD WITHOUT CONTRAST TECHNIQUE: Contiguous axial images were obtained from the base of the skull through the vertex without intravenous contrast. RADIATION DOSE REDUCTION: This exam was performed according to the departmental dose-optimization program which includes automated exposure control, adjustment of the mA and/or kV according to patient size and/or use of iterative reconstruction technique. COMPARISON:  CT and MRI studies most recently 01/09/2022 FINDINGS: Brain: No abnormality seen affecting the brainstem or cerebellum. Left cerebral hemisphere appears normal by CT. Multiple widely scattered foci of low density present within the right cerebral hemisphere primarily throughout the right middle cerebral artery territory consistent with innumerable small infarctions. No large regional confluent infarction. Mild generalized swelling. No hemorrhage. No midline shift. Vascular: Right ICA stent in place. Vascular calcification premature for age. Skull: Normal Sinuses/Orbits: Inflammatory changes of the paranasal sinuses. Orbits negative. Other: None IMPRESSION: Patchy low-density throughout the right middle cerebral artery territory consistent with the innumerable small infarction shown by MRI. No large confluent infarction. No evidence of hemorrhage or mass effect. Electronically Signed   By: Nelson Chimes M.D.   On: 01/11/2022 16:15    DG CHEST PORT 1 VIEW  Result Date: 01/11/2022 CLINICAL DATA:  Acute respiratory distress EXAM: PORTABLE CHEST 1 VIEW COMPARISON:  01/09/2022 FINDINGS: No significant change in AP portable chest radiograph. Cardiomegaly with mild, diffuse bilateral interstitial opacity and probable small, layering pleural effusions. No new or focal airspace opacity. Endotracheal tube tip position is generally obscured by overlying esophagogastric tube but appears appropriate over the midtrachea. Esophagogastric tube with tip and side port below the diaphragm. IMPRESSION: 1. No significant change in AP portable chest radiograph. Cardiomegaly with mild, diffuse bilateral interstitial opacity and probable small, layering pleural effusions most likely edema. No new or focal airspace opacity. 2. Endotracheal tube tip position is generally obscured by overlying esophagogastric tube but appears appropriate over the midtrachea. Electronically Signed   By: Delanna Ahmadi M.D.   On: 01/11/2022 11:19   DG Abd Portable 1V  Result Date: 01/10/2022 CLINICAL DATA:  NG tube placement EXAM: PORTABLE ABDOMEN - 1 VIEW COMPARISON:  None Available. FINDINGS: Enteric tube terminates in the gastric antrum. Nonobstructive bowel gas pattern. Cholecystectomy clips. IMPRESSION: Enteric tube terminates in the gastric antrum. Electronically Signed   By: Julian Hy M.D.   On: 01/10/2022 19:06   US RENAL  Result Date: 01/10/2022 CLINICAL DATA:  Renal dysfunction EXAM: RENAL / URINARY TRACT ULTRASOUND COMPLETE COMPARISON:  01/01/2020 FINDINGS: Right Kidney: Renal measurements: 9.2 x 4.8 x 6.1 cm = volume: 141.7 mL. There is no hydronephrosis. There is increased cortical echogenicity. Left Kidney: Renal measurements: Lennie Odor by 6.2 x 4.8 cm = volume: 171.8 mL. There is no hydronephrosis. There is increased cortical echogenicity. Bladder: Foley catheter is seen in the  bladder. Urinary bladder is not distended and not adequately evaluated. Other:  None. IMPRESSION: There is no hydronephrosis. Increased cortical echogenicity in the kidneys suggests medical renal disease. Electronically Signed   By: Elmer Picker M.D.   On: 01/10/2022 15:17   ECHOCARDIOGRAM COMPLETE BUBBLE STUDY  Result Date: 01/10/2022    ECHOCARDIOGRAM REPORT   Patient Name:   MALAIKA ARNALL Date of Exam: 01/10/2022 Medical Rec #:  [id number]       Height:       65.0 in Accession #:    [id number]      Weight:       209.0 lb Date of Birth:  1981-01-30       BSA:          2.015 m Patient Age:    73 years        BP:           121/65 mmHg Patient Gender: F               HR:           107 bpm. Exam Location:  Inpatient Procedure: 2D Echo, Color Doppler, Cardiac Doppler and Saline Contrast Bubble            Study Indications:     Stroke  History:         Patient has prior history of Echocardiogram examinations, most                  recent 11/19/2021. CHF, CAD, Stroke; Risk Factors:Diabetes.  Sonographer:     Memory Argue Sonographer#2:   Johny Chess RDCS Referring Phys:  [id number] Lorenza Chick Diagnosing Phys: Gwyndolyn Kaufman MD IMPRESSIONS  1. There is akinesis of the mid-to-apical septal LV segments, all apical segments, and apex. There is significant thinning of the mid-to-apical septal segments and apex likely represents scarring/infarct. The basal-to-mid inferior and basal septal segments are hypokinetic.  2. No LV thrombus visualized on definity imaging.  3. Left ventricular ejection fraction, by estimation, is 30 to 35%. The left ventricle has moderately decreased function. The left ventricle demonstrates regional wall motion abnormalities (see scoring diagram/findings for description). The left ventricular internal cavity size was mildly-to-moderately dilated. There is mild concentric left ventricular hypertrophy. Indeterminate diastolic filling due to E-A fusion.  4. Right ventricular systolic function is normal. The right ventricular size is normal.  5. The mitral  valve is normal in structure. Trivial mitral valve regurgitation.  6. The aortic valve is tricuspid. Aortic valve regurgitation is not visualized. No aortic stenosis is present.  7. The inferior vena cava is dilated in size with >50% respiratory variability, suggesting right atrial pressure of 8 mmHg.  8. Agitated saline contrast bubble study was negative, with no evidence of any interatrial shunt. Comparison(s): Compared to prior TTE on 10/2021, the EF has dropped to 30-35% with similar wall motion. FINDINGS  Left Ventricle: There is akinesis of the mid-to-apical septal LV segments, all apical segments, and apex. There is significant thinning of the mid-to-apical septal segments and apex which likely represents scarring/infarct. The basal-to-mid inferior and  basal septal segments are hypokinetic. No LV thrombus visualized. Left ventricular ejection fraction, by estimation, is 30 to 35%. The left ventricle has moderately decreased function. The left ventricle demonstrates regional wall motion abnormalities. Definity contrast agent was given IV to delineate the left ventricular endocardial borders. The left ventricular internal cavity size was mildly to moderately dilated. There is mild concentric left ventricular hypertrophy. Indeterminate diastolic filling  due to E-A fusion. Right Ventricle: The right ventricular size is normal. No increase in right ventricular wall thickness. Right ventricular systolic function is normal. Left Atrium: Left atrial size was normal in size. Right Atrium: Right atrial size was normal in size. Pericardium: There is no evidence of pericardial effusion. Mitral Valve: The mitral valve is normal in structure. Trivial mitral valve regurgitation. Tricuspid Valve: The tricuspid valve is normal in structure. Tricuspid valve regurgitation is trivial. Aortic Valve: The aortic valve is tricuspid. Aortic valve regurgitation is not visualized. No aortic stenosis is present. Aortic valve mean  gradient measures 4.0 mmHg. Aortic valve peak gradient measures 7.4 mmHg. Aortic valve area, by VTI measures 2.86 cm. Pulmonic Valve: The pulmonic valve was normal in structure. Pulmonic valve regurgitation is trivial. Aorta: The aortic root is normal in size and structure. Venous: The inferior vena cava is dilated in size with greater than 50% respiratory variability, suggesting right atrial pressure of 8 mmHg. IAS/Shunts: The atrial septum is grossly normal. Agitated saline contrast was given intravenously to evaluate for intracardiac shunting. Agitated saline contrast bubble study was negative, with no evidence of any interatrial shunt.  LEFT VENTRICLE PLAX 2D LVIDd:         4.80 cm LVIDs:         3.40 cm LV PW:         1.00 cm LV IVS:        1.10 cm LVOT diam:     2.00 cm LV SV:         57 LV SV Index:   29 LVOT Area:     3.14 cm  LV Volumes (MOD) LV vol d, MOD A2C: 170.0 ml LV vol d, MOD A4C: 112.0 ml LV vol s, MOD A2C: 108.0 ml LV vol s, MOD A4C: 61.0 ml LV SV MOD A2C:     62.0 ml LV SV MOD A4C:     112.0 ml LV SV MOD BP:      56.6 ml RIGHT VENTRICLE RV S prime:     14.10 cm/s LEFT ATRIUM             Index        RIGHT ATRIUM           Index LA diam:        4.00 cm 1.98 cm/m   RA Area:     10.40 cm LA Vol (A2C):   60.2 ml 29.87 ml/m  RA Volume:   18.80 ml  9.33 ml/m LA Vol (A4C):   43.2 ml 21.43 ml/m LA Biplane Vol: 51.0 ml 25.30 ml/m  AORTIC VALVE AV Area (Vmax):    2.82 cm AV Area (Vmean):   2.71 cm AV Area (VTI):     2.86 cm AV Vmax:           136.00 cm/s AV Vmean:          94.500 cm/s AV VTI:            0.201 m AV Peak Grad:      7.4 mmHg AV Mean Grad:      4.0 mmHg LVOT Vmax:         122.00 cm/s LVOT Vmean:        81.600 cm/s LVOT VTI:          0.183 m LVOT/AV VTI ratio: 0.91  AORTA Ao Root diam: 2.90 cm MR Peak grad: 53.3 mmHg MR Vmax:      365.00 cm/s SHUNTS  Systemic VTI:  0.18 m                           Systemic Diam: 2.00 cm Gwyndolyn Kaufman MD Electronically  signed by Gwyndolyn Kaufman MD Signature Date/Time: 01/10/2022/2:00:22 PM    Final (Updated)    MR ANGIO HEAD WO CONTRAST  Result Date: 01/09/2022 CLINICAL DATA:  Status post revascularization of occluded extracranial ICA. EXAM: MRA HEAD WITHOUT CONTRAST TECHNIQUE: Angiographic images of the Circle of Willis were acquired using MRA technique without intravenous contrast. COMPARISON:  Cerebral arteriogram 01/08/2022 FINDINGS: Anterior circulation: Segmental signal is present in the high cervical segment of the ICA. No signal is visualized in the petrous are pre cavernous right ICA. Flow signal is present in the cavernous and supraclinoid right ICA as well as the posterior communicating artery. More normal flow is present in the high left cervical ICA. Signal loss in the supraclinoid left ICA may be artifactual. ICA terminus signal is within normal limits. The left A1 segment is dominant. Left M1 segment is normal. Flow is present in the right M1 segment. The MCA branch vessels are intact bilaterally vocal robust on the left. ACA branch vessels are within normal limits bilaterally. Posterior circulation: The left vertebral artery is the dominant vessel. No flow is present in the right vertebral artery below the PICA. The basilar artery is normal. The left posterior cerebral artery originates from the basilar tip. The right posterior cerebral artery is of fetal type. The right P1 segment is present. PCA branch vessels are within normal limits bilaterally. Anatomic variants: Fetal type right posterior cerebral artery. Other: None. IMPRESSION: 1. Abnormal flow signal within the petrous and pre cavernous right ICA may represent artifact from stent or slow flow 2. Signal loss in the supraclinoid left ICA may be artifactual. 3. Asymmetric diffused decreased size of right MCA branch vessels compared to the left likely reflects decreased perfusion. No significant proximal stenosis or occlusion. Electronically Signed   By:  San Morelle M.D.   On: 01/09/2022 17:17   MR BRAIN WO CONTRAST  Result Date: 01/09/2022 CLINICAL DATA:  Status post revascularization of occluded cervical ICA. EXAM: MRI HEAD WITHOUT CONTRAST TECHNIQUE: Multiplanar, multiecho pulse sequences of the brain and surrounding structures were obtained without intravenous contrast. COMPARISON:  CT head without contrast 01/08/2022 FINDINGS: Brain: Diffusion-weighted images demonstrate extensive patchy restricted diffusion throughout the MCA territory. Most confluent areas are along the watershed. There is significant sparing of cortex within the right MCA territory although scattered foci of acute infarct are noted. T2 and FLAIR hyperintensities are associated with the areas of acute infarction. Most consistent cortical infarct is in the anterior right frontal lobe along the watershed. Focal nonhemorrhagic infarct is also present within the genu of the corpus callosum and more posteriorly along the body of the corpus callosum on the right. No acute hemorrhage is present. White matter changes extend into the brainstem. The internal auditory canals are within normal limits. Cerebellum is within normal limits. Vascular: Intravascular contrast is present in the left internal carotid artery but not the right. This likely represents T1 shortening from height noted contrast. Branch vessels are seen in the [place] territory and left MCA territory but not on the right. Flow is present in the posterior circulation. Skull and upper cervical spine: The craniocervical junction is normal. Upper cervical spine is within normal limits. Marrow signal is unremarkable. Sinuses/Orbits: Fluid levels are present the maxillary sinuses and throughout the ethmoid  air cells. Mild mucosal thickening is present in the frontal sinuses bilaterally. Fluid levels are present in the sphenoid sinuses. Small mastoid effusions are present. The globes and orbits are within normal limits. IMPRESSION: 1.  Extensive patchy areas of acute nonhemorrhagic infarction involving the right MCA territory, most confluent areas along the watershed. 2. Focal nonhemorrhagic infarct involving the genu of the corpus callosum and more posteriorly along the body of the corpus callosum on the right. 3. Diffuse sinus disease is likely secondary to intubation. Electronically Signed   By: San Morelle M.D.   On: 01/09/2022 17:09   DG Abd Portable 1V  Result Date: 01/09/2022 CLINICAL DATA:  Orogastric tube placement EXAM: PORTABLE ABDOMEN - 1 VIEW COMPARISON:  CT abdomen pelvis 06/23/2014 FINDINGS: Orogastric tube terminates in the region of the gastric antrum. Mild left basilar atelectasis. No dilated loops of bowel identified within the visualized upper abdomen. Central venous catheter tip located at the cavoatrial junction. IMPRESSION: Orogastric tube terminates in the region of the gastric antrum. Electronically Signed   By: Miachel Roux M.D.   On: 01/09/2022 10:07   DG Chest Port 1 View  Result Date: 01/09/2022 CLINICAL DATA:  Endotracheal tube placement. EXAM: PORTABLE CHEST 1 VIEW COMPARISON:  01/09/2022 earlier. FINDINGS: Endotracheal tube has tip 4.7 cm above the carina. Left IJ central venous catheter unchanged with tip at the cavoatrial junction. Lungs are hypoinflated with persistent hazy right perihilar/paramediastinal opacification and mild prominence of the central pulmonary vasculature. Stable mild opacification in the left base/retrocardiac region. No pneumothorax. Cardiomediastinal silhouette and remainder of the exam is unchanged. IMPRESSION: 1. Stable hazy opacification over the right perihilar/paramediastinal region and left base/retrocardiac region. Findings may be due to asymmetric edema versus infection. Suggestion of mild vascular congestion. 2. Tubes and lines as described. Electronically Signed   By: Marin Olp M.D.   On: 01/09/2022 09:21   DG CHEST PORT 1 VIEW  Result Date:  01/09/2022 CLINICAL DATA:  Status post intubation. EXAM: PORTABLE CHEST 1 VIEW COMPARISON:  Chest radiograph dated 01/08/2021. FINDINGS: Interval retraction of the endotracheal tube with tip now approximately 4 cm above the carina. Left IJ central venous line in similar position. Similar or slightly worsened scratch pulmonary hazy and streaky densities, likely combination of atelectasis and vascular congestion and edema. Pneumonia is not excluded. Trace left pleural effusion suspected. No pneumothorax. No acute osseous pathology. IMPRESSION: Interval retraction of the endotracheal tube with tip now approximately 4 cm above the carina. Similar or slightly worsened pulmonary opacities in the right upper lobe compared to the earlier radiograph. Electronically Signed   By: Anner Crete M.D.   On: 01/09/2022 01:04   CT HEAD WO CONTRAST (5MM)  Result Date: 01/08/2022 CLINICAL DATA:  Stroke follow-up EXAM: CT HEAD WITHOUT CONTRAST TECHNIQUE: Contiguous axial images were obtained from the base of the skull through the vertex without intravenous contrast. RADIATION DOSE REDUCTION: This exam was performed according to the departmental dose-optimization program which includes automated exposure control, adjustment of the mA and/or kV according to patient size and/or use of iterative reconstruction technique. COMPARISON:  01/08/2022 CT head FINDINGS: Brain: Redemonstrated hypodensity in the right frontal periventricular white matter. No additional hypodensity. No evidence of acute hemorrhage, mass, mass effect, or midline shift. No hydrocephalus or extra-axial fluid collection. Vascular: No hyperdense vessel. Skull: Normal. Negative for fracture or focal lesion. Sinuses/Orbits: Mucosal thickening throughout the paranasal sinuses, with air-fluid levels with bubbly fluid in the maxillary sinuses and right sphenoid sinus. Fluid in the nasopharynx  is likely related to intubation. The orbits are unremarkable. Other: The  mastoid air cells are well aerated. IMPRESSION: 1. Redemonstrated hypodensity in the right frontal periventricular white matter. No additional acute intracranial process. No additional acute infarct or hemorrhage. 2. Air-fluid levels with bubbly fluid in the maxillary sinuses and right sphenoid sinus are likely related to intubation but can be seen in the setting of sinusitis. Electronically Signed   By: Merilyn Baba M.D.   On: 01/08/2022 21:57   DG Chest Portable 1 View  Result Date: 01/08/2022 CLINICAL DATA:  Chest pain. EXAM: PORTABLE CHEST 1 VIEW COMPARISON:  Chest radiograph dated 06/18/2020. FINDINGS: Endotracheal tube with tip just above the carina. Recommend retraction by 3 cm for optimal positioning. Left IJ central venous line with tip at the cavoatrial junction. There is shallow inspiration. There is cardiomegaly with vascular congestion and edema. Pneumonia is not excluded. Clinical correlation is recommended. Probable trace left pleural effusion. No pneumothorax. No acute osseous pathology. IMPRESSION: 1. Endotracheal tube with tip just above the carina. Recommend retraction by 3 cm for optimal positioning. 2. Cardiomegaly with vascular congestion and edema. Pneumonia is not excluded. Electronically Signed   By: Anner Crete M.D.   On: 01/08/2022 20:16   CT ANGIO HEAD NECK W WO CM (CODE STROKE)  Result Date: 01/08/2022 CLINICAL DATA:  Neuro deficit, acute, stroke suspected; [id number] EXAM: CT ANGIOGRAPHY HEAD AND NECK CT PERFUSION BRAIN TECHNIQUE: Multidetector CT imaging of the head and neck was performed using the standard protocol during bolus administration of intravenous contrast. Multiplanar CT image reconstructions and MIPs were obtained to evaluate the vascular anatomy. Carotid stenosis measurements (when applicable) are obtained utilizing NASCET criteria, using the distal internal carotid diameter as the denominator. Multiphase CT imaging of the brain was performed following IV bolus  contrast injection. Subsequent parametric perfusion maps were calculated using RAPID software. RADIATION DOSE REDUCTION: This exam was performed according to the departmental dose-optimization program which includes automated exposure control, adjustment of the mA and/or kV according to patient size and/or use of iterative reconstruction technique. CONTRAST:  58m OMNIPAQUE IOHEXOL 350 MG/ML SOLN COMPARISON:  CT head from the same day. FINDINGS: CTA NECK FINDINGS Aortic arch: Great vessel origins are patent. Right carotid system: Common carotid artery is patent without significant stenosis. The ICA is occluded proximally with non opacification of the remainder of the neck. Left carotid system: Mild atherosclerosis at the carotid bifurcation without significant (greater than 50%) stenosis. Vertebral arteries: Small/non dominant right vertebral artery, which is irregular throughout its V2 segment and occluded at the V3 segment with non opacification of the proximal intradural vertebral artery and reconstitution of the distal intradural vertebral artery. Dominant left intradural vertebral artery is patent throughout the neck without significant) greater than 50%) stenosis. Skeleton: No acute findings. Other neck: No acute findings Upper chest: Moderate layering bilateral pleural effusions. Peribronchial thickening. Review of the MIP images confirms the above findings CTA HEAD FINDINGS Anterior circulation: Non-opacified right intracranial ICA. Small but opacified right M1 MCA and proximal right M2 MCA branches. Severe left paraclinoid ICA stenosis. Left MCA and ACAs are patent. Posterior circulation: Proximal right intradural vertebral artery is not opacified. Distal intradural vertebral artery is opacified, likely from collaterals and retrograde flow. Left intradural vertebral artery, basilar artery and bilateral posterior cerebral arteries are patent without proximal hemodynamically significant stenosis. Venous  sinuses: As permitted by contrast timing, patent. Review of the MIP images confirms the above findings CT Brain Perfusion Findings: ASPECTS: 10. CBF (<30%) Volume:  28m Perfusion (Tmax>6.0s) volume: 066mPerfusion (Tmax>4.0s) volume: 38 mL Mismatch Volume: 35m34mnfarction Location:None identified. IMPRESSION: 1. 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 Right MCA and A1 ACA are patent, but diminutive. 2. Small/non dominant right vertebral artery, which is irregular throughout its V2 segment with age indeterminate occlusion at the V3 segment with non opacification of the proximal intradural vertebral artery and reconstitution of the distal intradural vertebral artery. 3. Severe left paraclinoid ICA stenosis. 4. One CT perfusion, no evidence of core infarct or penumbra; however, there is approximately 38 mL of T-max greater than 4 seconds in the right MCA territory which suggests possible oligemia. An MRI could provide more sensitive evaluation for acute infarct. 5. Moderate layering bilateral pleural effusions and peribronchial wall thickening. Recommend dedicated chest imaging. Code stroke imaging results were communicated on 01/08/2022 at 2:16 pm to provider Bhagat via telephone, who verbally acknowledged these results. Electronically Signed   By: FreMargaretha SheffieldD.   On: 01/08/2022 14:19   CT CEREBRAL PERFUSION W CONTRAST  Result Date: 01/08/2022 CLINICAL DATA:  Neuro deficit, acute, stroke suspected; [id number]: CT ANGIOGRAPHY HEAD AND NECK CT PERFUSION BRAIN TECHNIQUE: Multidetector CT imaging of the head and neck was performed using the standard protocol during bolus administration of intravenous contrast. Multiplanar CT image reconstructions and MIPs were obtained to evaluate the vascular anatomy. Carotid stenosis measurements (when applicable) are obtained utilizing NASCET criteria, using the distal internal  carotid diameter as the denominator. Multiphase CT imaging of the brain was performed following IV bolus contrast injection. Subsequent parametric perfusion maps were calculated using RAPID software. RADIATION DOSE REDUCTION: This exam was performed according to the departmental dose-optimization program which includes automated exposure control, adjustment of the mA and/or kV according to patient size and/or use of iterative reconstruction technique. CONTRAST:  535m32mNIPAQUE IOHEXOL 350 MG/ML SOLN COMPARISON:  CT head from the same day. FINDINGS: CTA NECK FINDINGS Aortic arch: Great vessel origins are patent. Right carotid system: Common carotid artery is patent without significant stenosis. The ICA is occluded proximally with non opacification of the remainder of the neck. Left carotid system: Mild atherosclerosis at the carotid bifurcation without significant (greater than 50%) stenosis. Vertebral arteries: Small/non dominant right vertebral artery, which is irregular throughout its V2 segment and occluded at the V3 segment with non opacification of the proximal intradural vertebral artery and reconstitution of the distal intradural vertebral artery. Dominant left intradural vertebral artery is patent throughout the neck without significant) greater than 50%) stenosis. Skeleton: No acute findings. Other neck: No acute findings Upper chest: Moderate layering bilateral pleural effusions. Peribronchial thickening. Review of the MIP images confirms the above findings CTA HEAD FINDINGS Anterior circulation: Non-opacified right intracranial ICA. Small but opacified right M1 MCA and proximal right M2 MCA branches. Severe left paraclinoid ICA stenosis. Left MCA and ACAs are patent. Posterior circulation: Proximal right intradural vertebral artery is not opacified. Distal intradural vertebral artery is opacified, likely from collaterals and retrograde flow. Left intradural vertebral artery, basilar artery and bilateral  posterior cerebral arteries are patent without proximal hemodynamically significant stenosis. Venous sinuses: As permitted by contrast timing, patent. Review of the MIP images confirms the above findings CT Brain Perfusion Findings: ASPECTS: 10. CBF (<30%) Volume: 35mL 435mfusion (Tmax>6.0s) volume: 35mL P15musion (Tmax>4.0s) volume: 38 mL Mismatch Volume: 35mL In735mction Location:None identified. IMPRESSION: 1. 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 Right MCA and A1 ACA are patent, but diminutive. 2. Small/non dominant right vertebral artery, which is irregular throughout its V2 segment with age indeterminate occlusion at the V3 segment with non opacification of the proximal intradural vertebral artery and reconstitution of the distal intradural vertebral artery. 3. Severe left paraclinoid ICA stenosis. 4. One CT perfusion, no evidence of core infarct or penumbra; however, there is approximately 38 mL of T-max greater than 4 seconds in the right MCA territory which suggests possible oligemia. An MRI could provide more sensitive evaluation for acute infarct. 5. Moderate layering bilateral pleural effusions and peribronchial wall thickening. Recommend dedicated chest imaging. Code stroke imaging results were communicated on 01/08/2022 at 2:16 pm to provider Bhagat via telephone, who verbally acknowledged these results. Electronically Signed   By: Margaretha Sheffield M.D.   On: 01/08/2022 14:19   CT HEAD CODE STROKE WO CONTRAST  Result Date: 01/08/2022 CLINICAL DATA:  Code stroke.  Left arm weakness.  Left facial droop. EXAM: CT HEAD WITHOUT CONTRAST TECHNIQUE: Contiguous axial images were obtained from the base of the skull through the vertex without intravenous contrast. RADIATION DOSE REDUCTION: This exam was performed according to the departmental dose-optimization program which includes automated exposure control,  adjustment of the mA and/or kV according to patient size and/or use of iterative reconstruction technique. COMPARISON:  CT head without contrast 12/12/2021 FINDINGS: Brain: Periventricular white matter hypodensities adjacent to the frontal horn of the right lateral ventricles and in the right corona radiata are new since prior exam. No acute or focal cortical abnormality is present. Basal ganglia are intact. Insular ribbon is normal. The ventricles are of normal size. No significant extraaxial fluid collection is present. The brainstem and cerebellum are within normal limits. Vascular: Atherosclerotic calcifications are again seen within the cavernous internal carotid arteries. No hyperdense vessel is present. No significant interval change is present. Skull: Calvarium is intact. No focal lytic or blastic lesions are present. No significant extracranial soft tissue lesion is present. Sinuses/Orbits: Mild mucosal thickening is present maxillary sinuses bilaterally. The paranasal sinuses and mastoid air cells are otherwise clear. The globes and orbits are within normal limits. ASPECTS [facility] Stroke Program Early CT Score) - Ganglionic level infarction (caudate, lentiform nuclei, internal capsule, insula, M1-M3 cortex): 7/7 - Supraganglionic infarction (M4-M6 cortex): 3/3 Total score (0-10 with 10 being normal): 10/10 IMPRESSION: 1. Periventricular white matter hypodensities adjacent to the frontal horn of the right lateral ventricles and in the right corona radiata are new since prior exam. This may represent acute ischemia. 2. No acute or focal cortical abnormality. 3. ASPECTS is 10/10. The above was relayed via text pager to Dr. Curly Shores on 01/08/2022 at 13:39 . Electronically Signed   By: San Morelle M.D.   On: 01/08/2022 13:41   MR FOOT LEFT WO CONTRAST  Result Date: 01/02/2022 CLINICAL DATA:  Foot swelling, diabetic, osteomyelitis suspected, xray done EXAM: MRI OF THE LEFT FOOT WITHOUT CONTRAST  TECHNIQUE: Multiplanar, multisequence MR imaging of the left forefoot was performed. No intravenous contrast was administered. COMPARISON:  Foot radiograph 12/14/2021 FINDINGS: Bones/Joint/Cartilage Postsurgical changes of partial fifth ray amputation with residual fifth metatarsal and minimal marrow edema at the surgical margin. There is marrow edema and low T1 signal in the fourth metatarsal head and marrow edema in the fourth toe proximal phalanx. Mild effusion/joint distension of the fourth MTP joint. There is artifactual increased marrow signal in the great toe distal phalanx. Ligaments Intact Lisfranc ligament. Muscles  and Tendons Intramuscular edema and atrophy in the foot as is commonly seen in diabetics. Soft tissues There is soft tissue swelling of the foot most prominent in the lateral forefoot. There is no well-defined fluid collection on noncontrast MRI. IMPRESSION: Osteomyelitis of the distal fourth metatarsal head and likely early osteomyelitis of the adjacent proximal phalanx. Trace fourth MTP joint effusion suggesting developing septic arthritis. Adjacent soft tissue swelling without evidence of soft tissue abscess on noncontrast MRI. 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 These results will be called to the ordering clinician or representative by the Radiologist Assistant, and communication documented in the PACS or [employer]. Electronically Signed   By: Maurine Simmering M.D.   On: 01/02/2022 18:58     Discharge Exam: Vitals:   01/28/22 0746 01/28/22 1103  BP:  119/79  Pulse:  95  Resp:  18  Temp:  98.2 F (36.8 C)  SpO2: 99% 100%   Vitals:   01/28/22 0500 01/28/22 0725 01/28/22 0746 01/28/22 1103  BP:    119/79  Pulse:    95  Resp:    18  Temp:  98.5 F (36.9 C)  98.2 F (36.8 C)  TempSrc:  Oral  Oral  SpO2:   99% 100%  Weight: 90.7 kg      Height:        General: Pt is alert, awake, not in acute distress Cardiovascular: RRR, S1/S2 +, no rubs, no gallops Respiratory: CTA bilaterally, no wheezing, no rhonchi Abdominal: Soft, NT, ND, bowel sounds + Extremities: no edema, no cyanosis Neuro: Left hemiparesis, left facial droop, dysarthria.   The results of significant diagnostics from this hospitalization (including imaging, microbiology, ancillary and laboratory) are listed below for reference.     Microbiology: No results found for this or any previous visit (from the past 240 hour(s)).   Labs: BNP (last 3 results) No results for input(s): "BNP" in the last 8760 hours. Basic Metabolic Panel: Recent Labs  Lab 01/24/22 0336 01/25/22 0550 01/26/22 0555 01/27/22 0530 01/28/22 0500  NA 139 134* 136 139 134*  K 4.5 4.3 4.0 3.9 3.9  CL 102 99 102 101 101  CO2 25 25 28 28 25   GLUCOSE 135* 290* 119* 134* 211*  BUN 91* 77* 64* 53* 47*  CREATININE 2.69* 2.34* 2.05* 1.87* 1.98*  CALCIUM 8.7* 8.4* 8.4* 8.8* 8.4*  MG 3.2* 3.0* 2.8* 2.7* 2.5*  PHOS 7.3* 6.5* 5.9* 5.6* 5.2*   Liver Function Tests: Recent Labs  Lab 01/24/22 0336 01/25/22 0550 01/26/22 0555 01/27/22 0530 01/28/22 0500  AST 37 39 31 29 33  ALT 31 33 29 29 31   ALKPHOS 93 110 98 98 100  BILITOT 0.6 0.5 0.7 0.5 0.6  PROT 6.0* 6.1* 5.8* 5.8* 5.9*  ALBUMIN 2.3* 2.4* 2.3* 2.3* 2.4*   No results for input(s): "LIPASE", "AMYLASE" in the last 168 hours. No results for input(s): "AMMONIA" in the last 168 hours. CBC: Recent Labs  Lab 01/24/22 0336 01/25/22 0550 01/26/22 0555 01/26/22 1414 01/27/22 0530 01/28/22 0500  WBC 18.9* 19.0* 12.0*  --  11.2* 11.0*  NEUTROABS 13.2* 17.7* 8.3*  --  8.1* 7.9*  HGB 7.4* 7.0* 6.5* 8.3* 8.1* 8.1*  HCT 22.7* 22.0* 20.4* 24.9* 24.9* 25.5*  MCV 83.8 84.3 84.3  --  85.6 86.7  PLT 734* 736* 648*  --  671* 665*   Cardiac Enzymes: No results  for input(s): "CKTOTAL", "CKMB", "CKMBINDEX", "TROPONINI" in the last 168  hours. BNP: Invalid input(s): "POCBNP" CBG: Recent Labs  Lab 01/27/22 2026 01/27/22 2337 01/28/22 0330 01/28/22 0746 01/28/22 1140  GLUCAP 199* 237* 216* 125* 83   D-Dimer No results for input(s): "DDIMER" in the last 72 hours. Hgb A1c No results for input(s): "HGBA1C" in the last 72 hours. Lipid Profile No results for input(s): "CHOL", "HDL", "LDLCALC", "TRIG", "CHOLHDL", "LDLDIRECT" in the last 72 hours. Thyroid function studies Recent Labs    01/28/22 0500  TSH 41.777*   Anemia work up No results for input(s): "VITAMINB12", "FOLATE", "FERRITIN", "TIBC", "IRON", "RETICCTPCT" in the last 72 hours. Urinalysis    Component Value Date/Time   COLORURINE YELLOW 01/09/2022 0225   APPEARANCEUR CLEAR 01/09/2022 0225   LABSPEC 1.045 (H) 01/09/2022 0225   PHURINE 7.0 01/09/2022 0225   GLUCOSEU 50 (A) 01/09/2022 0225   HGBUR NEGATIVE 01/09/2022 0225   BILIRUBINUR NEGATIVE 01/09/2022 0225   KETONESUR NEGATIVE 01/09/2022 0225   PROTEINUR >=300 (A) 01/09/2022 0225   UROBILINOGEN 0.2 06/23/2014 1227   NITRITE NEGATIVE 01/09/2022 0225   LEUKOCYTESUR TRACE (A) 01/09/2022 0225   Sepsis Labs Recent Labs  Lab 01/25/22 0550 01/26/22 0555 01/27/22 0530 01/28/22 0500  WBC 19.0* 12.0* 11.2* 11.0*   Microbiology No results found for this or any previous visit (from the past 240 hour(s)).   Time coordinating discharge: Over 30 minutes  SIGNED:   Darliss Cheney, MD  [facility] 01/28/2022, 2:38 PM *Please note that this is a verbal dictation therefore any spelling or grammatical errors are due to the "[place]" system interpretation. If 7PM-7AM, please contact night-coverage [email or web address]

## 2022-01-29 DIAGNOSIS — Z515 Encounter for palliative care: Secondary | ICD-10-CM

## 2022-01-29 LAB — COMPREHENSIVE METABOLIC PANEL
ALT: 32 U/L (ref 0–44)
AST: 32 U/L (ref 15–41)
Albumin: 2.4 g/dL — ABNORMAL LOW (ref 3.5–5.0)
Alkaline Phosphatase: 98 U/L (ref 38–126)
Anion gap: 5 (ref 5–15)
BUN: 40 mg/dL — ABNORMAL HIGH (ref 6–20)
CO2: 26 mmol/L (ref 22–32)
Calcium: 8.4 mg/dL — ABNORMAL LOW (ref 8.9–10.3)
Chloride: 106 mmol/L (ref 98–111)
Creatinine, Ser: 1.88 mg/dL — ABNORMAL HIGH (ref 0.44–1.00)
GFR, Estimated: 34 mL/min — ABNORMAL LOW (ref >60)
Glucose, Bld: 312 mg/dL — ABNORMAL HIGH (ref 70–99)
Potassium: 4 mmol/L (ref 3.5–5.1)
Sodium: 137 mmol/L (ref 135–145)
Total Bilirubin: 0.3 mg/dL (ref 0.3–1.2)
Total Protein: 5.7 g/dL — ABNORMAL LOW (ref 6.5–8.1)

## 2022-01-29 LAB — CBC WITH DIFFERENTIAL/PLATELET
Abs Immature Granulocytes: 0.11 10*3/uL — ABNORMAL HIGH (ref 0.00–0.07)
Basophils Absolute: 0.1 10*3/uL (ref 0.0–0.1)
Basophils Relative: 1 %
Eosinophils Absolute: 0.3 10*3/uL (ref 0.0–0.5)
Eosinophils Relative: 3 %
HCT: 25.4 % — ABNORMAL LOW (ref 36.0–46.0)
Hemoglobin: 8.2 g/dL — ABNORMAL LOW (ref 12.0–15.0)
Immature Granulocytes: 1 %
Lymphocytes Relative: 19 %
Lymphs Abs: 1.8 10*3/uL (ref 0.7–4.0)
MCH: 27.9 pg (ref 26.0–34.0)
MCHC: 32.3 g/dL (ref 30.0–36.0)
MCV: 86.4 fL (ref 80.0–100.0)
Monocytes Absolute: 0.6 10*3/uL (ref 0.1–1.0)
Monocytes Relative: 6 %
Neutro Abs: 6.7 10*3/uL (ref 1.7–7.7)
Neutrophils Relative %: 70 %
Platelets: 635 10*3/uL — ABNORMAL HIGH (ref 150–400)
RBC: 2.94 MIL/uL — ABNORMAL LOW (ref 3.87–5.11)
RDW: 17.1 % — ABNORMAL HIGH (ref 11.5–15.5)
WBC: 9.5 10*3/uL (ref 4.0–10.5)
nRBC: 0 % (ref 0.0–0.2)

## 2022-01-29 LAB — GLUCOSE, CAPILLARY
Glucose-Capillary: 321 mg/dL — ABNORMAL HIGH (ref 70–99)
Glucose-Capillary: 369 mg/dL — ABNORMAL HIGH (ref 70–99)
Glucose-Capillary: 260 mg/dL — ABNORMAL HIGH (ref 70–99)
Glucose-Capillary: 128 mg/dL — ABNORMAL HIGH (ref 70–99)

## 2022-01-29 LAB — PHOSPHORUS: Phosphorus: 4.4 mg/dL (ref 2.5–4.6)

## 2022-01-29 LAB — MAGNESIUM: Magnesium: 2.4 mg/dL (ref 1.7–2.4)

## 2022-01-29 MED ORDER — SODIUM CHLORIDE 0.9% FLUSH
10.0000 mL | Freq: Two times a day (BID) | INTRAVENOUS | Status: DC
  Administered 2022-01-29 – 2022-02-18 (×19): 10 mL

## 2022-01-29 MED ORDER — LEVOTHYROXINE SODIUM 25 MCG PO TABS
125.0000 ug | ORAL_TABLET | Freq: Every day | ORAL | Status: DC
  Administered 2022-01-30 – 2022-02-19 (×21): 125 ug via ORAL
  Filled 2022-01-29 (×21): qty 1

## 2022-01-29 MED ORDER — INSULIN ASPART 100 UNIT/ML IJ SOLN
2.0000 [IU] | Freq: Three times a day (TID) | INTRAMUSCULAR | Status: DC
  Administered 2022-01-29 – 2022-02-05 (×16): 2 [IU] via SUBCUTANEOUS

## 2022-01-29 MED ORDER — INSULIN DETEMIR 100 UNIT/ML ~~LOC~~ SOLN
25.0000 [IU] | Freq: Two times a day (BID) | SUBCUTANEOUS | Status: DC
  Administered 2022-01-29 – 2022-02-09 (×22): 25 [IU] via SUBCUTANEOUS
  Filled 2022-01-29 (×25): qty 0.25

## 2022-01-29 MED ORDER — CHLORHEXIDINE GLUCONATE CLOTH 2 % EX PADS
6.0000 | MEDICATED_PAD | Freq: Every day | CUTANEOUS | Status: DC
  Administered 2022-01-29: 6 via TOPICAL

## 2022-01-29 MED ORDER — SODIUM CHLORIDE 0.9% FLUSH: 10.0000 mL | INTRAVENOUS | Status: DC | PRN

## 2022-01-29 MED ORDER — INSULIN DETEMIR 100 UNIT/ML ~~LOC~~ SOLN: 28.0000 [IU] | Freq: Two times a day (BID) | SUBCUTANEOUS | Status: DC

## 2022-01-29 NOTE — Progress Notes (Signed)
Inpatient Rehabilitation Care Coordinator Assessment and Plan Patient Details  Name: Lauren [name] MRN: [id number] Date of Birth: 22-May-1980  Today's Date: 01/29/2022  Hospital Problems: Principal Problem:   Acute right MCA stroke [facility])  Past Medical History:  Past Medical History:  Diagnosis Date   Anemia    CAD (coronary artery disease)    a. s/p cath in 03/2014 showing 30% mid-LAD, moderate to severe disease along small D1, patent LCx, moderate to severe distal OM2 stenosis and moderate diffuse diease along RCA not amenable to PCI   CHF (congestive heart failure) ([place])    a. EF 55-60% in 12/2019 b. EF at 35-40% by echo in 05/2020   CKD (chronic kidney disease) stage 4, GFR 15-29 ml/min (HCC)    Diabetes mellitus without complication ([place])    Myocardial infarction ([place])    Neuropathy    Stroke [facility])    Past Surgical History:  Past Surgical History:  Procedure Laterality Date   AMPUTATION Left 09/02/2021   Procedure: AMPUTATION RAY;  Surgeon: Criselda Peaches, DPM;  Location: [place];  Service: Podiatry;  Laterality: Left;  sagittal saw, 3L bag saline & Pulse   Cardiac catherization     CHOLECYSTECTOMY     I & D EXTREMITY Left 08/30/2021   Procedure: IRRIGATION AND DEBRIDEMENT WITH BONE BIOPSY;  Surgeon: [name], DPM;  Location: [place];  Service: Podiatry;  Laterality: Left;   IR ANGIO VERTEBRAL SEL SUBCLAVIAN INNOMINATE UNI L MOD SED  01/18/2022   IR CT HEAD LTD  01/08/2022   IR INTRA CRAN STENT  01/08/2022   IR PERCUTANEOUS ART THROMBECTOMY/INFUSION INTRACRANIAL INC DIAG ANGIO  01/08/2022   IRRIGATION AND DEBRIDEMENT FOOT Left 09/04/2021   Procedure: IRRIGATION AND DEBRIDEMENT FOOT AND CLOSURE;  Surgeon: Criselda Peaches, DPM;  Location: [place];  Service: Podiatry;  Laterality: Left;   RADIOLOGY WITH ANESTHESIA N/A 01/08/2022   Procedure: IR WITH ANESTHESIA;  Surgeon: Radiologist, Medication, MD;  Location: [place];  Service: Radiology;  Laterality: N/A;   THROMBECTOMY BRACHIAL  ARTERY Right 11/18/2021   Procedure: RIGHT BRACHIAL, RADIAL, & ULNAR ARTERY THROMBECTOMY.;  Surgeon: Marty Heck, MD;  Location: [place];  Service: Vascular;  Laterality: Right;   TUBAL LIGATION     Social History:  reports that she quit smoking about 15 months ago. Her smoking use included cigarettes. She smoked an average of .25 packs per day. She has never used smokeless tobacco. She reports that she does not drink alcohol and does not use drugs.  Family / Support Systems Marital Status: Married Patient Roles: Spouse, Parent, Other (Comment) (employee) Spouse/Significant Other: [name] [phone] Children: Rex Kras [phone]  39 yo son in the home Other Supports: Truman Hayward father [phone]  Nora-aunt [phone] Anticipated Caregiver: [name] and [name] Ability/Limitations of Caregiver: [name] works long hours and daughter also works they plan to work together on what pt needs. Made aware she will need 24/7 care due to severity of her stroke Caregiver Availability: 24/7 Family Dynamics: Close with family who will pull together to assist her at discharge. Pt is very motivated but has deficits. Pt has good social supports via friends and co-workers  Social History Preferred language: English Religion: Christian Cultural Background: No issues Education: Environmental education officer - How often do you need to have someone help you when you read instructions, pamphlets, or other written material from your doctor or pharmacy?: Never Writes: Yes Employment Status: Employed Name of Employer: [employer] Return to Work Plans: Hopes to return  if can likes her job and has been there a while Public relations account executive Issues: No issues Guardian/Conservator: HCPOA complered while in the hospital-her husband [name] is the primary and her daughter is secondary-MD feels pt is not fully capable of making her own decisons while here will look toward her husband for this    Abuse/Neglect Abuse/Neglect Assessment Can Be Completed: Yes Physical Abuse: Denies Verbal Abuse: Denies Sexual Abuse: Denies Exploitation of patient/patient's resources: Denies Self-Neglect: Denies  Patient response to: Social Isolation - How often do you feel lonely or isolated from those around you?: Never  Emotional Status Pt's affect, behavior and adjustment status: Pt and daughter answered this worker's questions. She was independent working as a Quarry manager at [employer] and and cared for her family. She is motivated to recover and get back to work if she can. Recent Psychosocial Issues: other multiple health issues Psychiatric History: No history and is able to verbalize her concerns, but would benefit from seeing neuro-psych while here due to mutliple health issues and new CVA. Substance Abuse History: No issues  Patient / Family Perceptions, Expectations & Goals Pt/Family understanding of illness & functional limitations: Pt and daughter can explain her stroke and deficits, pt has high hopes she will do well here and recover. She and daughter talk with the MD and feel they have a good understanding of her plan moving forward. Premorbid pt/family roles/activities: Mom, sibling, daughter, employee, church member, neice, Industrial/product designer, Social research officer, government Anticipated changes in roles/activities/participation: resume Pt/family expectations/goals: Pt states: " I want to do for myslef and get back to work."  Daughter states: " We all hope for the best but will help her if needed."  [employer]: None Premorbid Home Care/DME Agencies: None Transportation available at discharge: pt and husband Is the patient able to respond to transportation needs?: Yes In the past 12 months, has lack of transportation kept you from medical appointments or from getting medications?: No In the past 12 months, has lack of transportation kept you from meetings, work, or from getting things needed for  daily living?: No Resource referrals recommended: Neuropsychology  Discharge Planning Living Arrangements: Spouse/significant other, Children Support Systems: Spouse/significant other, Children, Parent, Other relatives, Friends/neighbors, Church/faith community Type of Residence: Private residence Insurance Resources: Multimedia programmer (specify) Pharmacologist) Financial Resources: Employment, Secondary school teacher Screen Referred: No Living Expenses: Medical laboratory scientific officer Management: Patient, Spouse Does the patient have any problems obtaining your medications?: No Home Management: Family Patient/Family Preliminary Plans: Return home with husband and daughter assisting her and other family members supplementing. Will awiat therapy evaluations and work on discharge needs. Aware of team conference on Tuesday. Care Coordinator Barriers to Discharge: Insurance for SNF coverage Care Coordinator Anticipated Follow Up Needs: HH/OP  Clinical Impression Pleasant female who is very motivated ut somewhat unrealistic she will reach independent level by discharge. Her daughter is present and she is very supportive, informed both she will need 24/7 care at discharge and will need to being making arrangements for this. Will update once team conference Tuesday. Have placed on neuro-psych list to see.  Elease Hashimoto 01/29/2022, 9:54 AM

## 2022-01-29 NOTE — Progress Notes (Signed)
Shortly after breathing treatment was started patient  stated that they made her feel more short of breath and did not want any more nebulizer treatment. Explained to patient what the breathing treatment did and the importance of taking them. Patient still refused.

## 2022-01-29 NOTE — Evaluation (Signed)
Physical Therapy Assessment and Plan  Patient Details  Name: Lauren [name] MRN: [id number] Date of Birth: August 09, 1980  PT Diagnosis: Coordination disorder, Difficulty walking, Hemiparesis non-dominant, Hemiplegia non-dominant, Impaired cognition, and Muscle weakness Rehab Potential: Fair ELOS: 14-21 days   {CHL IP REHAB PT TIME CALCULATION:304800500}   Hospital Problem: Principal Problem:   Acute right MCA stroke [facility])   Past Medical History:  Past Medical History:  Diagnosis Date   Anemia    CAD (coronary artery disease)    a. s/p cath in 03/2014 showing 30% mid-LAD, moderate to severe disease along small D1, patent LCx, moderate to severe distal OM2 stenosis and moderate diffuse diease along RCA not amenable to PCI   CHF (congestive heart failure) ([place])    a. EF 55-60% in 12/2019 b. EF at 35-40% by echo in 05/2020   CKD (chronic kidney disease) stage 4, GFR 15-29 ml/min (HCC)    Diabetes mellitus without complication ([place])    Myocardial infarction ([place])    Neuropathy    Stroke [facility])    Past Surgical History:  Past Surgical History:  Procedure Laterality Date   AMPUTATION Left 09/02/2021   Procedure: AMPUTATION RAY;  Surgeon: Criselda Peaches, DPM;  Location: [place];  Service: Podiatry;  Laterality: Left;  sagittal saw, 3L bag saline & Pulse   Cardiac catherization     CHOLECYSTECTOMY     I & D EXTREMITY Left 08/30/2021   Procedure: IRRIGATION AND DEBRIDEMENT WITH BONE BIOPSY;  Surgeon: [name], DPM;  Location: [place];  Service: Podiatry;  Laterality: Left;   IR ANGIO VERTEBRAL SEL SUBCLAVIAN INNOMINATE UNI L MOD SED  01/18/2022   IR CT HEAD LTD  01/08/2022   IR INTRA CRAN STENT  01/08/2022   IR PERCUTANEOUS ART THROMBECTOMY/INFUSION INTRACRANIAL INC DIAG ANGIO  01/08/2022   IRRIGATION AND DEBRIDEMENT FOOT Left 09/04/2021   Procedure: IRRIGATION AND DEBRIDEMENT FOOT AND CLOSURE;  Surgeon: Criselda Peaches, DPM;  Location: [place];  Service: Podiatry;  Laterality: Left;    RADIOLOGY WITH ANESTHESIA N/A 01/08/2022   Procedure: IR WITH ANESTHESIA;  Surgeon: Radiologist, Medication, MD;  Location: [place];  Service: Radiology;  Laterality: N/A;   THROMBECTOMY BRACHIAL ARTERY Right 11/18/2021   Procedure: RIGHT BRACHIAL, RADIAL, & ULNAR ARTERY THROMBECTOMY.;  Surgeon: Marty Heck, MD;  [facility];  Service: Vascular;  Laterality: Right;   TUBAL LIGATION      Assessment & Plan Clinical Impression: Patient is a 41 y.o. year old female with recent admission to the hospital on *** with ***.  Patient transferred to CIR on 01/28/2022 .   Patient currently requires {BBC:4888916} with mobility secondary to {impairments:3041632}.  Prior to hospitalization, patient was {XIH:0388828} with mobility and lived with Spouse, Son, Daughter in a Mobile home home.  Home access is 4 or 6Stairs to enter.  Patient will benefit from skilled PT intervention to {benefits:22816} for planned discharge {planned discharge:3041670}.  Anticipate patient will {follow MK:3491791} at discharge.  PT - End of Session Activity Tolerance: Tolerates 10 - 20 min activity with multiple rests Endurance Deficit: Yes Endurance Deficit Description: decreased PT Assessment Rehab Potential (ACUTE/IP ONLY): Fair PT Barriers to Discharge: [place] home environment;Decreased caregiver support;Home environment access/layout;Wound Care;Lack of/limited family support;Insurance for SNF coverage;Weight;Behavior;Nutrition means PT Patient demonstrates impairments in the following area(s): Balance;Endurance;Motor;Nutrition;Perception;Safety;Sensory PT Transfers Functional Problem(s): Bed Mobility;Bed to Chair;Car;Furniture PT Locomotion Functional Problem(s): Ambulation;Wheelchair Mobility;Stairs PT Plan PT Intensity: Minimum of 1-2 x/day ,45 to 90 minutes PT Frequency: 5 out of 7  days PT Duration Estimated Length of Stay: 14-21 days PT Treatment/Interventions: Ambulation/gait training;Community  reintegration;DME/adaptive equipment instruction;Neuromuscular re-education;Psychosocial support;Stair training;UE/LE Strength taining/ROM;Wheelchair propulsion/positioning;UE/LE Coordination activities;Therapeutic Activities;Skin care/wound management;Pain management;Functional electrical stimulation;Discharge planning;Balance/vestibular training;Cognitive remediation/compensation;Disease management/prevention;Functional mobility training;Patient/family education;Splinting/orthotics;Therapeutic Exercise;Visual/perceptual remediation/compensation PT Transfers Anticipated Outcome(s): CGA PT Locomotion Anticipated Outcome(s): CGA PT Recommendation Recommendations for Other Services: Therapeutic Recreation consult Therapeutic Recreation Interventions: Stress management Follow Up Recommendations: 24 hour supervision/assistance;Home health PT Patient destination: Home Equipment Recommended: To be determined   PT Evaluation Precautions/Restrictions Precautions Precautions: Fall Precaution Comments: L inattention, R gaze preference, L hemiplegia, no dressing to L foot - wear post-op shoe OOB Required Braces or Orthoses: Other Brace Other Brace: dressing on L foot Restrictions Weight Bearing Restrictions: No General   Vital Signs Pain Pain Assessment Pain Scale: 0-10 Pain Score: 4  Pain Type: Acute pain;Surgical pain Pain Location: Foot Pain Orientation: Left Pain Interference Pain Interference Pain Effect on Sleep: 2. Occasionally Pain Interference with Therapy Activities: 2. Occasionally Pain Interference with Day-to-Day Activities: 2. Occasionally Home Living/Prior Functioning Home Living Available Help at Discharge: Family;Available 24 hours/day Type of Home: Mobile home Home Access: Stairs to enter Entrance Stairs-Number of Steps: 4 or 6 Entrance Stairs-Rails: Right;Left Home Layout: One level Bathroom Shower/Tub: Multimedia programmer: Standard Bathroom  Accessibility: Yes Additional Comments: post op shoe for L foot s/p toe amputation  Lives With: Spouse;Son;Daughter Prior Function Level of Independence: Independent with basic ADLs;Independent with homemaking with ambulation;Independent with gait;Independent with transfers  Able to Take Stairs?: Yes Driving: Yes 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[employer]) Vision/Perception  Vision - Assessment Eye Alignment: Impaired (comment) Ocular Range of Motion: Restricted on the left Alignment/Gaze Preference: Gaze right Tracking/Visual Pursuits: Decreased smoothness of vertical tracking;Decreased smoothness of horizontal tracking Saccades: Additional eye shifts occurred during testing Convergence: Impaired (comment) Perception Perception: Impaired Inattention/Neglect: Does not attend to left side of body;Does not attend to left visual field Praxis Praxis: Intact  Cognition Overall Cognitive Status: Impaired/Different from baseline Arousal/Alertness: Awake/alert Orientation Level: Oriented to person;Oriented to place;Oriented to situation;Disoriented to time Year: 2023 Month: September Day of Week: Incorrect Attention: Focused Focused Attention: Impaired Focused Attention Impairment: Verbal basic Sustained Attention: Impaired Sustained Attention Impairment: Verbal basic Memory: Impaired Memory Impairment: Retrieval deficit;Decreased recall of new information;Decreased short term memory Decreased Short Term Memory: Functional basic Awareness: Impaired Awareness Impairment: Emergent impairment Problem Solving: Impaired Problem Solving Impairment: Functional basic;Verbal basic Executive Function: Organizing;Sequencing;Decision Making Sequencing: Impaired Sequencing Impairment: Functional basic Organizing: Impaired Decision Making: Impaired Safety/Judgment: Impaired Comments: impulsive Sensation Sensation Light Touch: Appears Intact Hot/Cold: Appears Intact Proprioception:  Appears Intact Stereognosis: Not tested Coordination Gross Motor Movements are Fluid and Coordinated: No Fine Motor Movements are Fluid and Coordinated: No Coordination and Movement Description: L hemiplegia Heel Shin Test: refused Motor  Motor Motor: Hemiplegia;Abnormal tone Motor - Skilled Clinical Observations: Hemiplegia and flaccidity in LUE, hemipareisis in LLE with decreased motor control   Trunk/Postural Assessment  Cervical Assessment Cervical Assessment: Exceptions to [facility] (forward head) Thoracic Assessment Thoracic Assessment: Exceptions to [facility] (rounded shoulders) Lumbar Assessment Lumbar Assessment: Exceptions to [facility] (posterior pelvic tilt in sitting) Postural Control Postural Control: Deficits on evaluation (delayed)  Balance Balance Balance Assessed: Yes Static Sitting Balance Static Sitting - Balance Support: Feet supported;Right upper extremity supported Static Sitting - Level of Assistance: 5: Stand by assistance Dynamic Sitting Balance Dynamic Sitting - Balance Support: Feet supported;Right upper extremity supported Dynamic Sitting - Level of Assistance: 4: Min assist Dynamic Sitting - Balance Activities: Reaching for objects;Reaching  across midline;Forward lean/weight shifting;Lateral lean/weight shifting Sitting balance - Comments: Pt repeatedly pulling self down to laying position to R in bed while in sitting. Static Standing Balance Static Standing - Balance Support: Right upper extremity supported Static Standing - Level of Assistance: 3: Mod assist Static Standing - Comment/# of Minutes: Repeatedly returning to sit despite vc/ tc to remain standing. Dynamic Standing Balance Dynamic Standing - Balance Support: Right upper extremity supported Dynamic Standing - Level of Assistance: 2: Max assist Dynamic Standing - Balance Activities: Lateral lean/weight shifting;Reaching across midline;Reaching for objects Extremity Assessment  RUE Assessment RUE  Assessment: Exceptions to [facility] Active Range of Motion (AROM) Comments: WFL General Strength Comments: 4/5 MMT LUE Assessment LUE Assessment: Exceptions to [facility] Passive Range of Motion (PROM) Comments: Painful with ROM testing, WFL shoulder flex/ext, elbow flex/ext, supin/pron, wrist flex/ext, finger flex/ext, edema noted General Strength Comments: 0/5 LUE Body System: Neuro Brunstrum levels for arm and hand: Arm;Hand Brunstrum level for arm: Stage I Presynergy Brunstrum level for hand: Stage I Flaccidity RLE Assessment RLE Assessment: Exceptions to [facility] RLE Strength RLE Overall Strength: Deficits Right Hip Flexion: 3+/5 Right Hip Extension: 4/5 Right Hip ABduction: 4-/5 Right Hip ADduction: 4-/5 Right Knee Flexion: 4+/5 Right Knee Extension: 4-/5 Right Ankle Dorsiflexion: 3+/5 Right Ankle Plantar Flexion: 4-/5 LLE Assessment LLE Assessment: Exceptions to WFL LLE Strength LLE Overall Strength: Deficits Left Hip Flexion: 3-/5 Left Hip ABduction: 3-/5 Left Hip ADduction: 3/5 Left Knee Flexion: 3+/5 Left Knee Extension: 3-/5 Left Ankle Dorsiflexion: 0/5 Left Ankle Plantar Flexion: 2-/5  Care Tool Care Tool Bed Mobility Roll left and right activity   Roll left and right assist level: Minimal Assistance - Patient > 75%    Sit to lying activity   Sit to lying assist level: Moderate Assistance - Patient 50 - 74%    Lying to sitting on side of bed activity   Lying to sitting on side of bed assist level: the ability to move from lying on the back to sitting on the side of the bed with no back support.: Minimal Assistance - Patient > 75%     Care Tool Transfers Sit to stand transfer   Sit to stand assist level: Moderate Assistance - Patient 50 - 74%    Chair/bed transfer   Chair/bed transfer assist level: Moderate Assistance - Patient 50 - 74%     Toilet transfer   Assist Level: Moderate Assistance - Patient 50 - 74%    Car transfer Car transfer activity did not occur:  Safety/medical concerns        Care Tool Locomotion Ambulation Ambulation activity did not occur: Safety/medical concerns        Walk 10 feet activity Walk 10 feet activity did not occur: Safety/medical concerns       Walk 50 feet with 2 turns activity Walk 50 feet with 2 turns activity did not occur: Safety/medical concerns      Walk 150 feet activity Walk 150 feet activity did not occur: Safety/medical concerns      Walk 10 feet on uneven surfaces activity Walk 10 feet on uneven surfaces activity did not occur: Safety/medical concerns      Stairs Stair activity did not occur: Safety/medical concerns        Walk up/down 1 step activity Walk up/down 1 step or curb (drop down) activity did not occur: Safety/medical concerns      Walk up/down 4 steps activity Walk up/down 4 steps activity did not occur: Safety/medical concerns  Walk up/down 12 steps activity Walk up/down 12 steps activity did not occur: Safety/medical concerns      Pick up small objects from floor Pick up small object from the floor (from standing position) activity did not occur: Safety/medical concerns      Wheelchair Is the patient using a wheelchair?: Yes Type of Wheelchair: Manual   Wheelchair assist level: Total Assistance - Patient < 25% Max wheelchair distance: 300 ft  Wheel 50 feet with 2 turns activity   Assist Level: Total Assistance - Patient < 25%  Wheel 150 feet activity   Assist Level: Total Assistance - Patient < 25%    Refer to Care Plan for Long Term Goals  SHORT TERM GOAL WEEK 1    Recommendations for other services: Therapeutic Recreation  Stress management  Skilled Therapeutic Intervention Mobility Bed Mobility Bed Mobility: Supine to Sit Rolling Right: Minimal Assistance - Patient > 75% Rolling Left: Supervision/Verbal cueing Right Sidelying to Sit: Contact Guard/Touching assist;Minimal Assistance - Patient > 75% Left Sidelying to Sit: Minimal Assistance - Patient  >75%;Moderate Assistance - Patient 50-74% Supine to Sit: Minimal Assistance - Patient > 75% Sit to Supine: Minimal Assistance - Patient > 75% Transfers Transfers: Sit to Stand;Stand to Sit;Squat Pivot Transfers Sit to Stand: Moderate Assistance - Patient 50-74% Stand to Sit: Moderate Assistance - Patient 50-74% Squat Pivot Transfers: Maximal Assistance - Patient 25-49% Transfer (Assistive device): 1 person hand held assist Locomotion  Gait Ambulation: No Gait Gait: No Wheelchair Mobility Wheelchair Mobility: Yes Wheelchair Assistance: Dependent - Patient 0% Wheelchair Propulsion: Other (comment) (Does not participate and reduced attn to L) Wheelchair Parts Management: Needs assistance   Discharge Criteria: Patient will be discharged from PT if patient refuses treatment 3 consecutive times without medical reason, if treatment goals not met, if there is a change in medical status, if patient makes no progress towards goals or if patient is discharged from hospital.  The above assessment, treatment plan, treatment alternatives and goals were discussed and mutually agreed upon: by patient and by family  Alger Simons 01/29/2022, 5:58 PM

## 2022-01-29 NOTE — Progress Notes (Addendum)
PROGRESS NOTE   Subjective/Complaints: Pt asks about DC of IJ line. Diet advanced today. No new concerns.  LBM 9/28  ROS: denies CP, SOB, N/V/D/C, abdominal pain, vision changes  Negative other than HPI   Objective:   No results found. Recent Labs    01/28/22 0500 01/29/22 0636  WBC 11.0* 9.5  HGB 8.1* 8.2*  HCT 25.5* 25.4*  PLT 665* 635*   Recent Labs    01/28/22 0500 01/29/22 0636  NA 134* 137  K 3.9 4.0  CL 101 106  CO2 25 26  GLUCOSE 211* 312*  BUN 47* 40*  CREATININE 1.98* 1.88*  CALCIUM 8.4* 8.4*   No intake or output data in the 24 hours ending 01/29/22 1411      Physical Exam: Vital Signs Blood pressure (!) 133/91, pulse 93, temperature 98 F (36.7 C), temperature source Oral, resp. rate 16, weight 90.7 kg, last menstrual period 11/01/2021, SpO2 100 %.   Constitutional: No apparent distress. Appropriate appearance for age.  HENT: No JVD. Neck Supple.[place], AT, MMM. + L IJ line Eyes: PERRLA Cardiovascular: RRR, no murmurs/rub/gallops. No Edema. Peripheral pulses 2+  Respiratory: CTAB. No rales, rhonchi, or wheezing. On RA.  Abdomen: Soft,  + bowel sounds, NT, ND  GU: Not examined. + external catheter, draining clear urine.  Skin: + L 5th toe amputation; clean, dry without discharge.  MSK:      No apparent deformity.      Strength:                RUE: 5/5 SA, 5/5 EF, 5/5 EE, 5/5 WE, 5/5 FF, 5/5 FA                 LUE: flaccid, 0/5 throughout                RLE: 5/5 HF, 5/5 KE, 5/5 DF, 5/5 EHL, 5/5 PF                 LLE:  3/5 HF, 2/5 KE, 0/5 DF, 0/5 EHL, 1/5 PF    Neurologic exam:  Cognition: AAO to person, place, time and event.  Language: Fluent, No substitutions or neoglisms.Moderate dysarthria Mood: Pleasant affect, appropriate mood.  Sensation: To light touch intact in BL UEs and LEs  CN: Left facial droop   Assessment/Plan: 1. Functional deficits which require 3+ hours per day of  interdisciplinary therapy in a comprehensive inpatient rehab setting. Physiatrist is providing close team supervision and 24 hour management of active medical problems listed below. Physiatrist and rehab team continue to assess barriers to discharge/monitor patient progress toward functional and medical goals  Care Tool:  Bathing    Body parts bathed by patient: Right arm, Left arm, Chest, Abdomen, Buttocks, Right upper leg, Left upper leg, Face   Body parts bathed by helper: Front perineal area, Right lower leg, Left lower leg     Bathing assist Assist Level: Moderate Assistance - Patient 50 - 74%     Upper Body Dressing/Undressing Upper body dressing   What is the patient wearing?: Bra, Pull over shirt    Upper body assist Assist Level: Maximal Assistance - Patient 25 - 49%  Lower Body Dressing/Undressing Lower body dressing      What is the patient wearing?: Incontinence brief, Pants     Lower body assist Assist for lower body dressing: Maximal Assistance - Patient 25 - 49%     Toileting Toileting    Toileting assist Assist for toileting: Maximal Assistance - Patient 25 - 49%     Transfers Chair/bed transfer  Transfers assist     Chair/bed transfer assist level: Maximal Assistance - Patient 25 - 49%     Locomotion Ambulation   Ambulation assist              Walk 10 feet activity   Assist           Walk 50 feet activity   Assist           Walk 150 feet activity   Assist           Walk 10 feet on uneven surface  activity   Assist           Wheelchair     Assist               Wheelchair 50 feet with 2 turns activity    Assist            Wheelchair 150 feet activity     Assist          Blood pressure (!) 133/91, pulse 93, temperature 98 F (36.7 C), temperature source Oral, resp. rate 16, weight 90.7 kg, last menstrual period 11/01/2021, SpO2 100 %.  Medical Problem List and  Plan: 1. 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             -patient may shower with lines and foot wound covered             -ELOS/Goals: 14-21 days  -Continue CIR  2.  Antithrombotics: -DVT/anticoagulation:  Pharmaceutical: Lovenox             -antiplatelet therapy: Brilinta 3. Pain Management: Tylenol, oxycodone as needed 4. Mood/Behavior/Sleep: LCSW to evaluate and provide support             -continue melatonin 3 mg q HS             -antipsychotic agents: n/a 5. Neuropsych/cognition: This patient is capable of making decisions on her own behalf. 6. 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 Fluids/Electrolytes/Nutrition: Strict Is and Os and follow-up chemistries             -dys 3/thin liquids; SLP eval             -repeat mag/phos tomorrow 8: Right ICA dissection with stent placement: on Brilinta 9: Hypertension: monitor             -continue hydralazine 12.5 mg TID             -continue isosorbide 20 mg BID             -continue Lopressor 37.5 mg BID 10: Hyperlipidemia: continue Lipitor 40 mg daily 11: Coagulopathy s/p RUE embolectomy 10/2021: Eliquis PTA now on Lovenox; f                -follow-up hematology as outpt 12:  COPD exacerbation: hypoxia resolved; continue bronchodilators/nebs 13: Chronic diastolic and systolic heart failure:             -strict Is and Os and daily weight There were no vitals filed for this visit.              -no diuretics currently             -see meds above   [facility] Weights   01/29/22 0334  Weight: 90.7 kg    14: Hypothyroidism: continue Synthroid 125 mcg daily; follow-up TSH 15: Acute on chonic anemia: s/p 1 un PCs; follow-up CBC  -9/29 HGB stable at 8.2 16: DM: CBGs q AC and q HS (A1c = 8%)              -9/29 Continue levemir 25u BID  -Add 2u novolog TID with meals             -continue SSI q AC 17: Class 2 obesity: dietary counseling 18: Tobacco abuse: cessation counseling; continue nicotine patch 19: AKI atop CKD IV: trend BMP  -9/29 Cr improved to 1.88 20: CAD: stable; treated medically; Dr. Harl Bowie 21: Left foot osteomyelitis s/p ray amputation [name] -recent bone biopsy; cultures neg - Husband to bring in CAM boot for weightbearing, protection -Remove IJ order 22: Leukocytosis: trending down; follow-up CBC  -9/29 WBC 9/5 today, improved 23. Dysphagia  -Advanced to heart healthy/carb modified/ thin liquids    LOS: 1 days A FACE TO FACE EVALUATION WAS PERFORMED  Jennye Boroughs 01/29/2022, 2:11 PM

## 2022-01-29 NOTE — Progress Notes (Signed)
Lt IJ removed per protocol and per MD order. Manual pressure held for 5 min. Vaseline gauze, gauze and tegaderm applied to site. Instructed pt to lay flat for 30 min.

## 2022-01-29 NOTE — Progress Notes (Signed)
Contact and Droplet precaution placed on pt. Door.

## 2022-01-29 NOTE — Plan of Care (Signed)
  Problem: RH Swallowing Goal: LTG Patient will consume least restrictive diet using compensatory strategies with assistance (SLP) Description: LTG:  Patient will consume least restrictive diet using compensatory strategies with assistance (SLP) Flowsheets (Taken 01/29/2022 1312) LTG: Pt Patient will consume least restrictive diet using compensatory strategies with assistance of (SLP): Modified Independent   Problem: RH Cognition - SLP Goal: RH LTG Patient will demonstrate orientation with cues Description:  LTG:  Patient will demonstrate orientation to person/place/time/situation with cues (SLP)   Flowsheets (Taken 01/29/2022 1312) LTG Patient will demonstrate orientation to: Time LTG: Patient will demonstrate orientation using cueing (SLP): Supervision   Problem: RH Problem Solving Goal: LTG Patient will demonstrate problem solving for (SLP) Description: LTG:  Patient will demonstrate problem solving for basic/complex daily situations with cues  (SLP) Flowsheets (Taken 01/29/2022 1312) LTG: Patient will demonstrate problem solving for (SLP): Basic daily situations LTG Patient will demonstrate problem solving for: Supervision   Problem: RH Memory Goal: LTG Patient will demonstrate ability for day to day (SLP) Description: LTG:   Patient will demonstrate ability for day to day recall/carryover during cognitive/linguistic activities with assist  (SLP) Flowsheets (Taken 01/29/2022 1312) LTG: Patient will demonstrate ability for day to day recall:  New information  Daily complex information LTG: Patient will demonstrate ability for day to day recall/carryover during cognitive/linguistic activities with assist (SLP): Minimal Assistance - Patient > 75% Goal: LTG Patient will use memory compensatory aids to (SLP) Description: LTG:  Patient will use memory compensatory aids to recall biographical/new, daily complex information with cues (SLP) Flowsheets (Taken 01/29/2022 1312) LTG: Patient will  use memory compensatory aids to (SLP): Supervision   Problem: RH Attention Goal: LTG Patient will demonstrate this level of attention during functional activites (SLP) Description: LTG:  Patient will will demonstrate this level of attention during functional activites (SLP) Flowsheets (Taken 01/29/2022 1312) Patient will demonstrate during cognitive/linguistic activities the attention type of:  Selective  Sustained Patient will demonstrate this level of attention during cognitive/linguistic activities in: Controlled LTG: Patient will demonstrate this level of attention during cognitive/linguistic activities with assistance of (SLP): Supervision   Problem: RH Awareness Goal: LTG: Patient will demonstrate awareness during functional activites type of (SLP) Description: LTG: Patient will demonstrate awareness during functional activites type of (SLP) Flowsheets (Taken 01/29/2022 1312) Patient will demonstrate during cognitive/linguistic activities awareness type of: Emergent LTG: Patient will demonstrate awareness during cognitive/linguistic activities with assistance of (SLP): Supervision

## 2022-01-29 NOTE — Inpatient Diabetes Management (Signed)
Inpatient Diabetes Program Recommendations  AACE/ADA: New Consensus Statement on Inpatient Glycemic Control (2015)  Target Ranges:  Prepandial:   less than 140 mg/dL      Peak postprandial:   less than 180 mg/dL (1-2 hours)      Critically ill patients:  140 - 180 mg/dL   Lab Results  Component Value Date   GLUCAP 321 (H) 01/29/2022   HGBA1C 8.0 (H) 01/09/2022    Latest Reference Range & Units 01/28/22 07:46 01/28/22 11:40 01/28/22 16:59 01/28/22 21:24 01/29/22 06:26  Glucose-Capillary 70 - 99 mg/dL 125 (H) 83 186 (H) 236 (H) 321 (H)  (H): Data is abnormally high   DM1(does not make insulin.  Needs correction, basal and meal coverage)   Outpatient Diabetes medications:  Per Whitney with [facility]-last settings on 9/10  Basal rate 12a-12p = 0.9 units/hr--total 24 hr basal 21.6 units Insulin: Carb ratio= 1 unit/10 g  Correction Sensitivity= 38 mg/dL  Target BG= 120  Correction Threshold= 120  Active time= 4 hrs  Max basal rate= 2 units/hr  Extended bolus= off  Reverse correction= On  Max bolus= 20 units    Current orders for Inpatient glycemic control:  Levemir 25 units BID, Novolog 0-20 units Q4H, Glucernia @ 65 continuous   Inpatient Diabetes Program Recommendations:     Patient is eating a dysphasia 3 diet.  Please consider:   Novolog 2 units TID with meals if consumes at least 50%.     Noted also receiving tube feeds continuous (if tube feeds are stopped please consider decreasing correction to 0-9 units TID and 0-5 QHS)  Thank you, Bethena Roys E. Lynkin Saini, RN, MSN, CDE  Diabetes Coordinator Inpatient Glycemic Control Team Team Pager [phone] (8am-5pm) 01/29/2022 10:55 AM

## 2022-01-29 NOTE — Evaluation (Addendum)
Speech Language Pathology Assessment and Plan  Patient Details  Name: Lauren [name] MRN: [id number] Date of Birth: 1980/07/29  SLP Diagnosis: Cognitive Impairments;Dysarthria  Rehab Potential: Good ELOS: 2 weeks   Today's Date: 01/29/2022 SLP Individual Time: 0930-1030 SLP Individual Time Calculation (min): 60 min   Hospital Problem: Principal Problem:   Acute right MCA stroke [facility])  Past Medical History:  Past Medical History:  Diagnosis Date   Anemia    CAD (coronary artery disease)    a. s/p cath in 03/2014 showing 30% mid-LAD, moderate to severe disease along small D1, patent LCx, moderate to severe distal OM2 stenosis and moderate diffuse diease along RCA not amenable to PCI   CHF (congestive heart failure) ([place])    a. EF 55-60% in 12/2019 b. EF at 35-40% by echo in 05/2020   CKD (chronic kidney disease) stage 4, GFR 15-29 ml/min (HCC)    Diabetes mellitus without complication ([place])    Myocardial infarction ([place])    Neuropathy    Stroke [facility])    Past Surgical History:  Past Surgical History:  Procedure Laterality Date   AMPUTATION Left 09/02/2021   Procedure: AMPUTATION RAY;  Surgeon: Criselda Peaches, DPM;  Location: [place];  Service: Podiatry;  Laterality: Left;  sagittal saw, 3L bag saline & Pulse   Cardiac catherization     CHOLECYSTECTOMY     I & D EXTREMITY Left 08/30/2021   Procedure: IRRIGATION AND DEBRIDEMENT WITH BONE BIOPSY;  Surgeon: [name], DPM;  Location: [place];  Service: Podiatry;  Laterality: Left;   IR ANGIO VERTEBRAL SEL SUBCLAVIAN INNOMINATE UNI L MOD SED  01/18/2022   IR CT HEAD LTD  01/08/2022   IR INTRA CRAN STENT  01/08/2022   IR PERCUTANEOUS ART THROMBECTOMY/INFUSION INTRACRANIAL INC DIAG ANGIO  01/08/2022   IRRIGATION AND DEBRIDEMENT FOOT Left 09/04/2021   Procedure: IRRIGATION AND DEBRIDEMENT FOOT AND CLOSURE;  Surgeon: Criselda Peaches, DPM;  Location: ;  Service: Podiatry;  Laterality: Left;   RADIOLOGY WITH ANESTHESIA N/A 01/08/2022    Procedure: IR WITH ANESTHESIA;  Surgeon: Radiologist, Medication, MD;  Location: [place];  Service: Radiology;  Laterality: N/A;   THROMBECTOMY BRACHIAL ARTERY Right 11/18/2021   Procedure: RIGHT BRACHIAL, RADIAL, & ULNAR ARTERY THROMBECTOMY.;  Surgeon: Marty Heck, MD;  Location: [facility];  Service: Vascular;  Laterality: Right;   TUBAL LIGATION      Assessment / Plan / Recommendation Clinical Impression History of Present Illness: 41 year old female presented to [facility]08[facility]22 with left sided facial droop, left sided weakness, and slurred speech. 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 No core infarct or penumbra. Patient went immediately to IR for thrombectomy and noted R ICA dissection requiring repair.   Of note, patient with recent arterial thrombectomy of the right upper extremity due to limb ischemia. She has had URI x 2 days with recent sick contact. COVID negative but Rhinovirus positive. Extubated 9/10 on BiPAP initially .   On 9/13 HFNC and then febrile. Re intubated on 9/15 with eventual extubation on 9/18. ID changed antimicrobial coverage to cefadroxil and flagyl. Required Precedex for restlessness in ICU. Pulmonary felt respiratory distress is more so due to volume. Treated with IV lasix, Maretta Bees, Brovana and pulmicort nebulizer and aspiration precautions. Continue to titrate and wean Oxygen. Smoking cessation counseling.    Echo on 9/14 with EF 35 to 40 % and grade  II diastolic dysfunction. Cardiology consulted and felt likely to need ischemic eval as outpatient. Continue isosorbide mononitrate, metoprolol . Atorvastatin and brilinta. No ACEi/ARB/ARNIU/MRA/SGLT2 given renal funtion. Anemia felt secondary to anemia of chronic kidney disease. Transfused this admit and to continue to monitor. Type 1 DM on Levemir and SSI. Synthroid for hypothyroidism.    Left foot osteomyelitis  and diabetic ulcer. Podiatry consulted with no acute OR plans. Cefadroxil and metronidazole per ID. Nutrition per cortrak feeds with SLP to follow.   On eliquis prior to admit. Continue Statin and brilinta. Neuro recommended repeat hyper coag workup in 3 months.   SLP consulted to complete CSE and cognitive-linguistic evaluation in the setting of recent R MCA CVA. Pt presents with functional deficits in motor speech as evident by presence of mild dysarthria and mild-moderate cognitive impairment (in the domains of recall, attention, EF, and visuospatial skills), as evident by achieving a score of 15/30 on SLUMS (n = 27). As a result of these deficits, pt required overall Min A verbal cues for adherence to aspiration precautions and Mod A for cognitive task completion. CSE remarkable for decreased ROM, strength, and sensation on L. Despite OME findings, pt with functional deglutition of regular solid textures and thin liquids with no clinical s/sx concerning for aspiration observed. Recommend upgrade to regular textures at this time with thin liquids. Benefited from Min A verbal cues to consistently implement slow rate and small + single bites/sips during PO intake; therefore, recommend full supervision with PO intake.  Given discrepancy in pt's CLOF to PLOF, skilled ST intervention is recommended, at this time, to maximize pt's independence and decrease caregiver burden. Goal is for pt to be Mod I for aspiration precaution implementation, and Sup A for cognitive task completion. Pt and pt's daughter in agreement with proposed ST POC; please see below for details.   Skilled Therapeutic Interventions          CSE, SLUMS and informal assessment measures administered. Please see report for full details.  SLP Assessment  Patient will need skilled Speech Lanaguage Pathology Services during CIR admission    Recommendations  SLP Diet Recommendations: Age appropriate regular solids;Thin Liquid Administration  via: Cup;Straw Medication Administration: Whole meds with liquid Supervision: Staff to assist with self feeding;Full supervision/cueing for compensatory strategies Compensations: Small sips/bites;Slow rate;Follow solids with liquid Postural Changes and/or Swallow Maneuvers: Seated upright 90 degrees;Upright 30-60 min after meal Oral Care Recommendations: Oral care BID Recommendations for Other Services: Therapeutic Recreation consult Therapeutic Recreation Interventions: Pet therapy Patient destination: Home Follow up Recommendations: Home Health SLP;Outpatient SLP;24 hour supervision/assistance Equipment Recommended: None recommended by SLP    SLP Frequency 3 to 5 out of 7 days   SLP Duration  SLP Intensity  SLP Treatment/Interventions 2 weeks  Minumum of 1-2 x/day, 30 to 90 minutes  Cognitive remediation/compensation;Environmental controls;Functional tasks;Internal/external aids;Patient/family education;Dysphagia/aspiration precaution training    Pain Pain Assessment Pain Scale: 0-10 Pain Score: 6  Pain Type: Acute pain Pain Location: Foot Pain Orientation: Left  Prior Functioning Cognitive/Linguistic Baseline: Within functional limits Type of Home: Mobile home  Lives With: Spouse Available Help at Discharge: Family;Available 24 hours/day Education: Has an Associate's Degree; was almost finished with her Bachelor's Degree per pt and family 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[employer])  SLP Evaluation Cognition Overall Cognitive Status: Impaired/Different from baseline Arousal/Alertness: Awake/alert Orientation Level: Oriented to person;Oriented to place;Oriented to situation;Disoriented to time Year: 2023 Month: September Day of Week: Incorrect Attention: Focused;Sustained Focused Attention: Appears   intact Focused Attention Impairment: Verbal basic Sustained Attention: Appears intact Sustained Attention Impairment: Verbal basic Memory:  Impaired Memory Impairment: Retrieval deficit;Decreased recall of new information;Decreased short term memory Decreased Short Term Memory: Verbal basic Awareness: Impaired Awareness Impairment: Emergent impairment Problem Solving: Impaired Problem Solving Impairment: Functional basic;Verbal basic Executive Function: Organizing;Sequencing;Decision Making (Impaired) Safety/Judgment: Impaired  Comprehension Auditory Comprehension Overall Auditory Comprehension: Appears within functional limits for tasks assessed Visual Recognition/Discrimination Discrimination: Not tested Reading Comprehension Reading Status:  (Attempted to assess; however, pt unable to demonstrate reading beyond large print at the phrase level) Expression Expression Primary Mode of Expression: Verbal Verbal Expression Overall Verbal Expression: Appears within functional limits for tasks assessed Written Expression Dominant Hand: Right Written Expression: Not tested Oral Motor Oral Motor/Sensory Function Overall Oral Motor/Sensory Function: Mild impairment Facial ROM: Reduced left;Suspected CN VII (facial) dysfunction Facial Symmetry: Abnormal symmetry left;Suspected CN VII (facial) dysfunction Lingual ROM: Within Functional Limits Lingual Symmetry: Within Functional Limits Lingual Sensation: Within Functional Limits Mandible: Within Functional Limits Motor Speech Overall Motor Speech: Impaired Respiration: Impaired (Decreased breath support) Level of Impairment: Sentence Phonation: Low vocal intensity Resonance: Within functional limits Articulation: Impaired Level of Impairment: Sentence Intelligibility: Intelligibility reduced Word: 75-100% accurate (90%) Phrase: 75-100% accurate (90%) Sentence: 75-100% accurate (85%) Motor Planning: Witnin functional limits Motor Speech Errors: Not applicable  Care Tool Care Tool Cognition Ability to hear (with hearing aid or hearing appliances if normally used  Ability to hear (with hearing aid or hearing appliances if normally used): 0. Adequate - no difficulty in normal conservation, social interaction, listening to TV   Expression of Ideas and Wants Expression of Ideas and Wants: 3. Some difficulty - exhibits some difficulty with expressing needs and ideas (e.g, some words or finishing thoughts) or speech is not clear   Understanding Verbal and Non-Verbal Content Understanding Verbal and Non-Verbal Content: 3. Usually understands - understands most conversations, but misses some part/intent of message. Requires cues at times to understand  Memory/Recall Ability Memory/Recall Ability : Current season;That he or she is in a hospital/hospital unit    Bedside Swallowing Assessment General Date of Onset: 01/08/22 Previous Swallow Assessment: BSE, MBSS on 9/21 Diet Prior to this Study: Dysphagia 3 (soft);Thin liquids Temperature Spikes Noted: No Respiratory Status: Room air History of Recent Intubation: Yes Length of Intubations (days): 3 days Date extubated: 01/18/22 Behavior/Cognition: Alert;Cooperative;Pleasant mood Oral Cavity - Dentition: Adequate natural dentition Self-Feeding Abilities: Able to feed self;Needs set up Patient Positioning: Upright in bed Baseline Vocal Quality: Low vocal intensity;Normal Volitional Cough: Strong Volitional Swallow: Able to elicit  Ice Chips Ice chips: Not tested Thin Liquid Thin Liquid: Within functional limits Presentation: Straw Nectar Thick Nectar Thick Liquid: Not tested Honey Thick Honey Thick Liquid: Not tested Puree Puree: Not tested Solid Solid: Within functional limits Presentation: Self Fed BSE Assessment Suspected Esophageal Findings Suspected Esophageal Findings:  (Reports hx of acid reflux) Risk for Aspiration Impact on safety and function: Mild aspiration risk Other Related Risk Factors: History of GERD;Cognitive impairment  Short Term Goals: Week 1: SLP Short Term Goal 1 (Week  1): Pt will demonstrate tolerance of regular diet textures with thin liquids with minimal to no s/sx concerning for airway compromise when given Sup A for implementation of safe swallowing strategies (small + single bites/sips, slow rate). SLP Short Term Goal 2 (Week 1): Pt will demonstrate increased awareness of current deficits s/p CVA by naming 3 physical and/or 3 cognitive deficits given Min A verbal cues. SLP Short Term Goal 3 (Week 1):  Pt will recall 3 pieces of novel information following 5 minute delay given Min A verbal cues. SLP Short Term Goal 4 (Week 1): Pt will complete basic visual and verbal problem-solving tasks r/t iADL and ADL completion given Min A verbal cues. SLP Short Term Goal 5 (Week 1): Pt will utilize visual scanning strategies to complete tasks targeting L inattention with 100% completion given Mod A verbal and visual cues.  Refer to Care Plan for Long Term Goals  Recommendations for other services: Therapeutic Recreation  Pet therapy  Discharge Criteria: Patient will be discharged from SLP if patient refuses treatment 3 consecutive times without medical reason, if treatment goals not met, if there is a change in medical status, if patient makes no progress towards goals or if patient is discharged from hospital.  The above assessment, treatment plan, treatment alternatives and goals were discussed and mutually agreed upon: by patient and by family  Romelle Starcher A Melodi Happel 01/29/2022, 1:00 PM

## 2022-01-29 NOTE — Evaluation (Signed)
Occupational Therapy Assessment and Plan  Patient Details  Name: Lauren [name] MRN: [id number] Date of Birth: February 15, 1981  OT Diagnosis: abnormal posture, acute pain, ataxia, cognitive deficits, disturbance of vision, flaccid hemiplegia and hemiparesis, hemiplegia affecting non-dominant side, and muscle weakness (generalized) Rehab Potential: Rehab Potential (ACUTE ONLY): Good ELOS: 3-4 weeks   Today's Date: 01/29/2022 OT Individual Time: 9357-0177 OT Individual Time Calculation (min): 75 min          Hospital Problem: Principal Problem:   Acute right MCA stroke ([place])   Past Medical History:  Past Medical History:  Diagnosis Date   Anemia    CAD (coronary artery disease)    a. s/p cath in 03/2014 showing 30% mid-LAD, moderate to severe disease along small D1, patent LCx, moderate to severe distal OM2 stenosis and moderate diffuse diease along RCA not amenable to PCI   CHF (congestive heart failure) ([place])    a. EF 55-60% in 12/2019 b. EF at 35-40% by echo in 05/2020   CKD (chronic kidney disease) stage 4, GFR 15-29 ml/min (HCC)    Diabetes mellitus without complication ([place])    Myocardial infarction ([place])    Neuropathy    Stroke [facility])    Past Surgical History:  Past Surgical History:  Procedure Laterality Date   AMPUTATION Left 09/02/2021   Procedure: AMPUTATION RAY;  Surgeon: Criselda Peaches, DPM;  Location: [place];  Service: Podiatry;  Laterality: Left;  sagittal saw, 3L bag saline & Pulse   Cardiac catherization     CHOLECYSTECTOMY     I & D EXTREMITY Left 08/30/2021   Procedure: IRRIGATION AND DEBRIDEMENT WITH BONE BIOPSY;  Surgeon: [name], DPM;  Location: [place];  Service: Podiatry;  Laterality: Left;   IR ANGIO VERTEBRAL SEL SUBCLAVIAN INNOMINATE UNI L MOD SED  01/18/2022   IR CT HEAD LTD  01/08/2022   IR INTRA CRAN STENT  01/08/2022   IR PERCUTANEOUS ART THROMBECTOMY/INFUSION INTRACRANIAL INC DIAG ANGIO  01/08/2022   IRRIGATION AND DEBRIDEMENT FOOT Left 09/04/2021    Procedure: IRRIGATION AND DEBRIDEMENT FOOT AND CLOSURE;  Surgeon: Criselda Peaches, DPM;  Location: [place];  Service: Podiatry;  Laterality: Left;   RADIOLOGY WITH ANESTHESIA N/A 01/08/2022   Procedure: IR WITH ANESTHESIA;  Surgeon: Radiologist, Medication, MD;  Location: [place];  Service: Radiology;  Laterality: N/A;   THROMBECTOMY BRACHIAL ARTERY Right 11/18/2021   Procedure: RIGHT BRACHIAL, RADIAL, & ULNAR ARTERY THROMBECTOMY.;  Surgeon: Marty Heck, MD;  Location: [place];  Service: Vascular;  Laterality: Right;   TUBAL LIGATION      Assessment & Plan Clinical Impression: Lauren [name] is a 41 year old female who presented to the emergency department on 05/10/2021 with unsteady gait left-sided facial droop left arm weakness with drift and slurred speech.  Code stroke called.  She was taking Eliquis secondary to recent upper extremity thromboembolus and history of coagulopathy.  Neurology consulted.  Imaging revealed age-indeterminate occlusion of proximal right ICA, V3 segment of right vertebral artery, left paraclinoid ICA stenosis.  She underwent left vertebral arteriogram with right common carotid arteriogram by Dr. Estanislado Pandy. Complete revascularization of the internal carotid artery achieved.  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  She was loaded with aspirin, Brilinta via orogastric tube.  She was left intubated.     Nephrology was consulted for renal failure on 9/10. Cardiology consulted and recent echo revealed some decrease in EF to approximately 30 to 35%.  Anemia noted and  she was transfused 1 unit of packed red blood cells.  Elevated troponins with unremarkable EKG and hypertension therefore treated with nitroglycerin and heparin infusion.  Infectious disease consultation obtained on 9/14 due to prior left foot osteomyelitis.  Blood cultures negative.  Augmentin continued.  Hematology consulted on 9/14 for  additional anticoagulation recommendations.  Podiatry consulted regarding bone biopsy of the left foot.  Augmentin discontinued.  Bone biopsy of the fourth metatarsal left foot was sent for culture and pathology performed at the bedside by Dr. Amalia Hailey 9/16.  Broad-spectrum antibiotics were adjusted.  She was extubated on 9/18.  Transferred to hospitalist service.  She has had fairly stable hemoglobin since transfusion.  Heparin infusion discontinued on 9/21 and she was started on Lovenox 80 mg twice daily.  Dosing was decreased to every 24 hours on 9/24.  Hypercoagulable work-up negative with some labs pending.  Hematology recommends outpatient referral. Bone culture negative now off antibiotics.   She underwent SPL evaluation and has been upgraded to dysphagia 3 diet.  BUN and creatinine on downward trend, unknown baseline.  Leukocytosis trending downward.  She has been afebrile over the past week. The patient requires inpatient physical medicine and rehabilitation evaluations and treatment secondary to dysfunction due to right MCA stroke.   Her past medical history significant for CAD medically managed, diabetes mellitus, stage IV kidney disease, prior CVA and coagulopathy.  She required right upper extremity embolectomy 11/18/2021.  She was placed on Eliquis.  She also has a history of left foot osteomyelitis and is status post multiple debridements and partial left fifth ray resection by podiatry.  Also has history of congestive heart failure and tobacco abuse.   2D echo showed EF of 30% with LDL 39, A1c 8.0  Patient transferred to CIR on 01/28/2022 .    Patient currently requires max with basic self-care skills secondary to muscle weakness, decreased cardiorespiratoy endurance, impaired timing and sequencing, ataxia, decreased coordination, and decreased motor planning, decreased visual perceptual skills, decreased visual motor skills, and field cut, decreased midline orientation, decreased attention to  left, and decreased motor planning, decreased initiation, decreased attention, decreased awareness, decreased problem solving, decreased safety awareness, decreased memory, and delayed processing, and decreased sitting balance, decreased standing balance, decreased postural control, hemiplegia, and decreased balance strategies.  Prior to hospitalization, patient could complete ADLs independently.  Patient will benefit from skilled intervention to increase independence with basic self-care skills prior to discharge home with care partner.  Anticipate patient will require 24 hour supervision and follow up home health.  OT - End of Session Activity Tolerance: Tolerates 10 - 20 min activity with multiple rests Endurance Deficit: Yes Endurance Deficit Description: decreased OT Assessment Rehab Potential (ACUTE ONLY): Good OT Barriers to Discharge: Inaccessible home environment OT Barriers to Discharge Comments: 4-6 steps OT Patient demonstrates impairments in the following area(s): Balance;Cognition;Edema;Endurance;Motor;Perception;Safety;Vision OT Basic ADL's Functional Problem(s): Eating;Grooming;Bathing;Dressing;Toileting OT Advanced ADL's Functional Problem(s): Light Housekeeping OT Transfers Functional Problem(s): Tub/Shower;Toilet OT Additional Impairment(s): Fuctional Use of Upper Extremity OT Plan OT Intensity: Minimum of 1-2 x/day, 45 to 90 minutes OT Frequency: 5 out of 7 days OT Duration/Estimated Length of Stay: 3-4 weeks OT Treatment/Interventions: Balance/vestibular training;Neuromuscular re-education;Self Care/advanced ADL retraining;Therapeutic Exercise;Wheelchair propulsion/positioning;Cognitive remediation/compensation;DME/adaptive equipment instruction;UE/LE Strength taining/ROM;Community reintegration;Functional electrical stimulation;Patient/family education;Splinting/orthotics;UE/LE Coordination activities;Discharge planning;Functional mobility training;Psychosocial  support;Therapeutic Activities;Visual/perceptual remediation/compensation OT Self Feeding Anticipated Outcome(s): Supervision OT Basic Self-Care Anticipated Outcome(s): Min A OT Toileting Anticipated Outcome(s): Min A OT Bathroom Transfers Anticipated Outcome(s): CGA OT Recommendation Patient destination: Home Follow Up  Recommendations: Home health OT Equipment Recommended: 3 in 1 bedside comode;Tub/shower bench   OT Evaluation Precautions/Restrictions  Precautions Precautions: Fall Precaution Comments: L inattention, R gaze preference, L hemiplegia, no dressing to L foot - wear post-op shoe OOB Required Braces or Orthoses: Other Brace Other Brace: dressing on L foot Restrictions Weight Bearing Restrictions: No General Chart Reviewed: Yes  Pain Pain Assessment Pain Scale: 0-10 Pain Score: 4  Pain Type: Acute pain;Surgical pain Pain Location: Foot Pain Orientation: Left Home Living/Prior Functioning Home Living Living Arrangements: Spouse/significant other, Children Available Help at Discharge: Family, Available 24 hours/day Type of Home: Mobile home Home Access: Stairs to enter Entrance Stairs-Number of Steps: 4 or 6 Entrance Stairs-Rails: Right, Left Home Layout: One level Bathroom Shower/Tub: Multimedia programmer: Standard Bathroom Accessibility: Yes Additional Comments: post op shoe for L foot s/p toe amputation  Lives With: Spouse, Son, Daughter IADL History Homemaking Responsibilities: Yes Meal Prep Responsibility: Therapist, occupational Responsibility: Primary Cleaning Responsibility: Primary Current License: Yes Mode of Transportation: Car Education: Has an Software engineer; was almost finished with her Bachelor's Degree per pt and family Prior Function Level of Independence: Independent with basic ADLs, Independent with homemaking with ambulation, Independent with gait, Independent with transfers  Able to Take Stairs?: Yes Driving: Yes Vocation:  Full time employment (CNA at [employer]) Vision Baseline Vision/History: 1 Wears glasses Vision Assessment?: Vision impaired- to be further tested in functional context;Yes Eye Alignment: Impaired (comment) Ocular Range of Motion: Restricted on the left Alignment/Gaze Preference: Gaze right Tracking/Visual Pursuits: Decreased smoothness of vertical tracking;Decreased smoothness of horizontal tracking Saccades: Additional eye shifts occurred during testing Convergence: Impaired (comment) Visual Fields: Left visual field deficit Depth Perception: Undershoots Perception  Perception: Impaired Inattention/Neglect: Does not attend to left side of body;Does not attend to left visual field Praxis Praxis: Intact Cognition Cognition Overall Cognitive Status: Impaired/Different from baseline Arousal/Alertness: Awake/alert Orientation Level: Person;Place;Situation Person: Oriented Place: Oriented Situation: Oriented Memory: Impaired Memory Impairment: Retrieval deficit;Decreased recall of new information;Decreased short term memory Decreased Short Term Memory: Functional basic Attention: Focused Focused Attention: Impaired Focused Attention Impairment: Verbal basic Sustained Attention: Impaired Sustained Attention Impairment: Verbal basic Awareness: Impaired Awareness Impairment: Emergent impairment Problem Solving: Impaired Problem Solving Impairment: Functional basic;Verbal basic Executive Function: Organizing;Sequencing;Decision Making Sequencing: Impaired Sequencing Impairment: Functional basic Organizing: Impaired Decision Making: Impaired Safety/Judgment: Impaired Comments: impulsive Brief Interview for Mental Status (BIMS) Repetition of Three Words (First Attempt): 3 Temporal Orientation: Year: Correct Temporal Orientation: Month: Accurate within 5 days Temporal Orientation: Day: Incorrect Recall: "Sock": Yes, no cue required Recall: "Blue": Yes, no cue required Recall:  "Bed": Yes, no cue required BIMS Summary Score: 14 Sensation Sensation Light Touch: Appears Intact Hot/Cold: Appears Intact Proprioception: Appears Intact Stereognosis: Not tested Coordination Gross Motor Movements are Fluid and Coordinated: No Fine Motor Movements are Fluid and Coordinated: No Coordination and Movement Description: L hemiplegia Heel Shin Test: refused Motor  Motor Motor: Hemiplegia;Abnormal tone Motor - Skilled Clinical Observations: Hemiplegia and flaccidity in LUE, hemipareisis in LLE with decreased motor control  Trunk/Postural Assessment  Cervical Assessment Cervical Assessment: Exceptions to [facility] (forward head) Thoracic Assessment Thoracic Assessment: Exceptions to [facility] (rounded shoulders) Lumbar Assessment Lumbar Assessment: Exceptions to [facility] (posterior pelvic tilt) Postural Control Postural Control: Deficits on evaluation (delayed)  Balance Balance Balance Assessed: Yes Static Sitting Balance Static Sitting - Balance Support: Feet supported Static Sitting - Level of Assistance: 5: Stand by assistance Dynamic Sitting Balance Dynamic Sitting - Balance Support: Feet supported Dynamic Sitting - Level of  Assistance: 4: Min assist Dynamic Sitting - Balance Activities: Reaching for objects Static Standing Balance Static Standing - Balance Support: Bilateral upper extremity supported Static Standing - Level of Assistance: 3: Mod assist Dynamic Standing Balance Dynamic Standing - Balance Support: Bilateral upper extremity supported Dynamic Standing - Level of Assistance: 2: Max assist Dynamic Standing - Balance Activities: Lateral lean/weight shifting Extremity/Trunk Assessment RUE Assessment RUE Assessment: Exceptions to [facility] Active Range of Motion (AROM) Comments: WFL General Strength Comments: 4/5 MMT LUE Assessment LUE Assessment: Exceptions to [facility] Passive Range of Motion (PROM) Comments: Painful with ROM testing, WFL shoulder flex/ext, elbow  flex/ext, supin/pron, wrist flex/ext, finger flex/ext, edema noted General Strength Comments: 0/5 LUE Body System: Neuro Brunstrum levels for arm and hand: Arm;Hand Brunstrum level for arm: Stage I Presynergy Brunstrum level for hand: Stage I Flaccidity  Care Tool Care Tool Self Care Eating   Eating Assist Level: Minimal Assistance - Patient > 75%    Oral Care    Oral Care Assist Level: Moderate Assistance - Patient 50 - 74%    Bathing   Body parts bathed by patient: Right arm;Left arm;Chest;Abdomen;Buttocks;Right upper leg;Left upper leg;Face Body parts bathed by helper: Front perineal area;Right lower leg;Left lower leg   Assist Level: Moderate Assistance - Patient 50 - 74%    Upper Body Dressing(including orthotics)   What is the patient wearing?: Bra;Pull over shirt   Assist Level: Maximal Assistance - Patient 25 - 49%    Lower Body Dressing (excluding footwear)   What is the patient wearing?: Incontinence brief;Pants Assist for lower body dressing: Maximal Assistance - Patient 25 - 49%    Putting on/Taking off footwear   What is the patient wearing?: Non-skid slipper socks Assist for footwear: Moderate Assistance - Patient 50 - 74%       Care Tool Toileting Toileting activity   Assist for toileting: Maximal Assistance - Patient 25 - 49%     Care Tool Bed Mobility Roll left and right activity   Roll left and right assist level: Minimal Assistance - Patient > 75%    Sit to lying activity   Sit to lying assist level: Minimal Assistance - Patient > 75%    Lying to sitting on side of bed activity   Lying to sitting on side of bed assist level: the ability to move from lying on the back to sitting on the side of the bed with no back support.: Minimal Assistance - Patient > 75%     Care Tool Transfers Sit to stand transfer   Sit to stand assist level: Maximal Assistance - Patient 25 - 49%    Chair/bed transfer   Chair/bed transfer assist level: Maximal Assistance  - Patient 25 - 49%     Toilet transfer   Assist Level: Maximal Assistance - Patient 24 - 49%     Care Tool Cognition  Expression of Ideas and Wants Expression of Ideas and Wants: 3. Some difficulty - exhibits some difficulty with expressing needs and ideas (e.g, some words or finishing thoughts) or speech is not clear  Understanding Verbal and Non-Verbal Content Understanding Verbal and Non-Verbal Content: 3. Usually understands - understands most conversations, but misses some part/intent of message. Requires cues at times to understand   Memory/Recall Ability Memory/Recall Ability : Current season;That he or she is in a hospital/hospital unit   Refer to Care Plan for [place] 1 OT Short Term Goal 1 (Week 1): Pt to be Mod  A for UB dressing OT Short Term Goal 2 (Week 1): Pt to be Mod A for LB dressing OT Short Term Goal 3 (Week 1): Pt to be Mod A for toileting OT Short Term Goal 4 (Week 1): Pt to demonstrate improved attention to L side with no more than 3 verbal cues OT Short Term Goal 5 (Week 1): Pt to perform functional transfers with Min A  Recommendations for other services: None    Skilled Therapeutic Intervention ADL ADL Eating: Minimal assistance;Moderate cueing Where Assessed-Eating: Bed level Grooming: Minimal assistance;Moderate cueing Where Assessed-Grooming: Bed level Upper Body Bathing: Minimal assistance;Moderate cueing Where Assessed-Upper Body Bathing: Other (Comment) (BSC) Lower Body Bathing: Moderate assistance;Moderate cueing (For balance to wash peri areas) Where Assessed-Lower Body Bathing: Other (Comment) (BSC) Upper Body Dressing: Maximal assistance;Moderate cueing Where Assessed-Upper Body Dressing: Other (Comment);Edge of bed (BSC) Lower Body Dressing: Maximal assistance;Moderate cueing Where Assessed-Lower Body Dressing: Edge of bed;Other (Comment) (BSC) Toileting: Moderate cueing Where Assessed-Toileting: Bedside  Commode Toilet Transfer: Maximal assistance;Moderate verbal cueing Toilet Transfer Method: Squat pivot Toilet Transfer Equipment: Drop arm bedside commode Tub/Shower Transfer: Unable to assess Social research officer, government: Unable to assess (Anticipate Mod A) Mobility  Bed Mobility Bed Mobility: Supine to Sit Rolling Right: Minimal Assistance - Patient > 75% Rolling Left: Supervision/Verbal cueing Right Sidelying to Sit: Contact Guard/Touching assist;Minimal Assistance - Patient > 75% Left Sidelying to Sit: Minimal Assistance - Patient >75%;Moderate Assistance - Patient 50-74% Supine to Sit: Minimal Assistance - Patient > 75% Sit to Supine: Minimal Assistance - Patient > 75% Transfers Sit to Stand: Moderate Assistance - Patient 50-74% Stand to Sit: Moderate Assistance - Patient 50-74%  Upon OT arrival, pt supine in bed with daughter and sister present. Pt reports no pain and is agreeable to OT session. OT evaluation initiated, educated on role of OT and plan of care. Pt completes full sponge bath ADL at the levels above while seated on BSC and EOB. Pt limited primarily by hemiplegia to L UE, decreased visual perception, decreased, attention, decreased cognition, and decreased balance and would benefit from continued OT services to achieve highest level of independence. Pt was left in bed at end of session with all needs met and safety measures in place.    Discharge Criteria: Patient will be discharged from OT if patient refuses treatment 3 consecutive times without medical reason, if treatment goals not met, if there is a change in medical status, if patient makes no progress towards goals or if patient is discharged from hospital.  The above assessment, treatment plan, treatment alternatives and goals were discussed and mutually agreed upon: by patient and by family  Marvetta Gibbons 01/29/2022, 5:21 PM

## 2022-01-29 NOTE — Progress Notes (Signed)
Inpatient [place] Individual Statement of Services  Patient Name:  Lauren [name]  Date:  01/29/2022  Welcome to the [facility].  Our goal is to provide you with an individualized program based on your diagnosis and situation, designed to meet your specific needs.  With this comprehensive rehabilitation program, you will be expected to participate in at least 3 hours of rehabilitation therapies Monday-Friday, with modified therapy programming on the weekends.  Your rehabilitation program will include the following services:  Physical Therapy (PT), Occupational Therapy (OT), Speech Therapy (ST), 24 hour per day rehabilitation nursing, Therapeutic Recreaction (TR), Neuropsychology, Care Coordinator, Rehabilitation Medicine, Nutrition Services, and Pharmacy Services  Weekly team conferences will be held on Tuesday to discuss your progress.  Your Inpatient Rehabilitation Care Coordinator will talk with you frequently to get your input and to update you on team discussions.  Team conferences with you and your family in attendance may also be held.  Expected length of stay: 3 weeks  Overall anticipated outcome: CGA-Min level  Depending on your progress and recovery, your program may change. Your Inpatient Rehabilitation Care Coordinator will coordinate services and will keep you informed of any changes. Your Inpatient Rehabilitation Care Coordinator's name and contact numbers are listed  below.  The following services may also be recommended but are not provided by the [facility] will be made to provide these services after discharge if needed.  Arrangements include referral to agencies that provide these services.  Your insurance has been verified to be:  [facility] Your primary doctor is:  Lemmie Evens  Pertinent information will be shared with your doctor and your insurance company.  Inpatient Rehabilitation Care Coordinator:  Ovidio Kin, [place] or Emilia Beck  Information discussed with and copy given to patient by: Elease Hashimoto, 01/29/2022, 9:55 AM

## 2022-01-29 NOTE — Progress Notes (Addendum)
   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  She was initially admitted under PCCM service, extubated on 01/18/2022 and transferred to [facility] on 01/21/2022.  Palliative care was asked to get involved to further address goals of care in the setting of a large stroke.   Today's Discussion 01/29/2022  *Please note that this is a verbal dictation therefore any spelling or grammatical errors are due to the "[employer]" system interpretation.  Chart reviewed inclusive of vital signs, progress notes, laboratory results, and diagnostic images.   I met with Lauren [name] this morning.  She shares that she just transition to CIR yesterday and is optimistic for continued functional improvements.  At this point in time she denies pain, nausea, dyspnea. Created space and opportunity for patient to explore thoughts feelings and fears regarding current medical situation.  She is for the time being focusing on the positives and interested to see how far she can get in terms of improvements.  We again briefly reviewed my role and I shared that I would be here to support her however that may be which she is grateful for.  Patient's daughter Lauren [name] met with and is grateful that her mother is on the rehab unit as she feels she is already made more progress there in less than 24 hours in terms of mobility than she had for the week she had been on the medical floor.  MOST form reviewed - is now in [place].   Questions and concerns addressed/Palliative Support Provided.   Objective Assessment: Vital Signs Vitals:   01/28/22 1933 01/29/22 0334  BP: 131/89 (!) 133/91  Pulse: (!) 104 93  Resp: 18 16   Temp: 98.2 F (36.8 C) 98 F (36.7 C)  SpO2: 100% 100%   No intake or output data in the 24 hours ending 01/29/22 1213 Last Weight  Most recent update: 01/29/2022  6:02 AM    Weight  90.7 kg (199 lb 15.3 oz)            Gen:  Adult Caucasian F in NAD HEENT: coretrack in place, moist mucous membranes CV: Regular rate and rhythm  PULM: ON RA breathing is even and nonlabored ABD: soft/nontender  EXT: No edema Neuro: Alert and oriented x3. Strong BLU/BLE movement. Little movement to LUE/LLE   SUMMARY OF RECOMMENDATIONS   DNAR/DNI --> Would not desire a tracheostomy, long term gastrostomy tube    MOST and Advanced Directives --> completed on 01/27/22   Goals are for functional improvement   Ongoing incremental Palliative support  Billing based on MDM: High ______________________________________________________________________________________ [place] Team Team Cell Phone: [phone] Please utilize secure chat with additional questions, if there is no response within 30 minutes please call the above phone number  Palliative Medicine Team providers are available by phone from 7am to 7pm daily and can be reached through the team cell phone.  Should this patient require assistance outside of these hours, please call the patient's attending physician.

## 2022-01-29 NOTE — Plan of Care (Signed)
Problem: RH Balance Goal: LTG: Patient will maintain dynamic sitting balance (OT) Description: LTG:  Patient will maintain dynamic sitting balance with assistance during activities of daily living (OT) Flowsheets (Taken 01/29/2022 1725) LTG: Pt will maintain dynamic sitting balance during ADLs with: Supervision/Verbal cueing Goal: LTG Patient will maintain dynamic standing with ADLs (OT) Description: LTG:  Patient will maintain dynamic standing balance with assist during activities of daily living (OT)  Flowsheets (Taken 01/29/2022 1725) LTG: Pt will maintain dynamic standing balance during ADLs with: Contact Guard/Touching assist   Problem: Sit to Stand Goal: LTG:  Patient will perform sit to stand in prep for activites of daily living with assistance level (OT) Description: LTG:  Patient will perform sit to stand in prep for activites of daily living with assistance level (OT) Flowsheets (Taken 01/29/2022 1725) LTG: PT will perform sit to stand in prep for activites of daily living with assistance level: Contact Guard/Touching assist   Problem: RH Eating Goal: LTG Patient will perform eating w/assist, cues/equip (OT) Description: LTG: Patient will perform eating with assist, with/without cues using equipment (OT) Flowsheets (Taken 01/29/2022 1725) LTG: Pt will perform eating with assistance level of: Set up assist    Problem: RH Grooming Goal: LTG Patient will perform grooming w/assist,cues/equip (OT) Description: LTG: Patient will perform grooming with assist, with/without cues using equipment (OT) Flowsheets (Taken 01/29/2022 1725) LTG: Pt will perform grooming with assistance level of: Independent with assistive device    Problem: RH Bathing Goal: LTG Patient will bathe all body parts with assist levels (OT) Description: LTG: Patient will bathe all body parts with assist levels (OT) Flowsheets (Taken 01/29/2022 1725) LTG: Pt will perform bathing with assistance level/cueing: Minimal  Assistance - Patient > 75% LTG: Position pt will perform bathing: Shower   Problem: RH Dressing Goal: LTG Patient will perform upper body dressing (OT) Description: LTG Patient will perform upper body dressing with assist, with/without cues (OT). Flowsheets (Taken 01/29/2022 1725) LTG: Pt will perform upper body dressing with assistance level of: Supervision/Verbal cueing Goal: LTG Patient will perform lower body dressing w/assist (OT) Description: LTG: Patient will perform lower body dressing with assist, with/without cues in positioning using equipment (OT) Flowsheets (Taken 01/29/2022 1725) LTG: Pt will perform lower body dressing with assistance level of: Minimal Assistance - Patient > 75%   Problem: RH Toileting Goal: LTG Patient will perform toileting task (3/3 steps) with assistance level (OT) Description: LTG: Patient will perform toileting task (3/3 steps) with assistance level (OT)  Flowsheets (Taken 01/29/2022 1725) LTG: Pt will perform toileting task (3/3 steps) with assistance level: Minimal Assistance - Patient > 75%   Problem: RH Vision Goal: RH LTG Vision Theme park manager) Flowsheets (Taken 01/29/2022 1725) LTG: Vision Goals: Pt will demonstrate ability to scan to B visual fields during functional tasks without verbal cues independently   Problem: RH Functional Use of Upper Extremity Goal: LTG Patient will use RT/LT upper extremity as a (OT) Description: LTG: Patient will use right/left upper extremity as a stabilizer/gross assist/diminished/nondominant/dominant level with assist, with/without cues during functional activity (OT) Flowsheets (Taken 01/29/2022 1725) LTG: Use of upper extremity in functional activities:  LUE as nondominant level  LUE as a stabilizer LTG: Pt will use upper extremity in functional activity with assistance level of: Independent   Problem: RH Light Housekeeping Goal: LTG Patient will perform light housekeeping w/assist (OT) Description: LTG: Patient  will perform light housekeeping with assistance, with/without cues (OT). Flowsheets (Taken 01/29/2022 1725) LTG: Pt will perform light housekeeping with assistance level  of: Supervision/Verbal cueing LTG: Pt will perform light housekeeping w/level of: Wheelchair level   Problem: RH Toilet Transfers Goal: LTG Patient will perform toilet transfers w/assist (OT) Description: LTG: Patient will perform toilet transfers with assist, with/without cues using equipment (OT) Flowsheets (Taken 01/29/2022 1725) LTG: Pt will perform toilet transfers with assistance level of: Contact Guard/Touching assist   Problem: RH Tub/Shower Transfers Goal: LTG Patient will perform tub/shower transfers w/assist (OT) Description: LTG: Patient will perform tub/shower transfers with assist, with/without cues using equipment (OT) Flowsheets (Taken 01/29/2022 1725) LTG: Pt will perform tub/shower stall transfers with assistance level of: Contact Guard/Touching assist LTG: Pt will perform tub/shower transfers from:  Walk in shower  Tub/shower combination   Problem: RH Memory Goal: LTG Patient will demonstrate ability for day to day recall/carry over during activities of daily living with assistance level (OT) Description: LTG:  Patient will demonstrate ability for day to day recall/carry over during activities of daily living with assistance level (OT). Flowsheets (Taken 01/29/2022 1725) LTG:  Patient will demonstrate ability for day to day recall/carry over during activities of daily living with assistance level (OT): Independent

## 2022-01-29 NOTE — Progress Notes (Signed)
Inpatient Rehabilitation  Patient information reviewed and entered into eRehab system by Mayer Vondrak M. Jyoti Harju, M.A., CCC/SLP, PPS Coordinator.  Information including medical coding, functional ability and quality indicators will be reviewed and updated through discharge.    

## 2022-01-30 ENCOUNTER — Inpatient Hospital Stay (HOSPITAL_COMMUNITY): Payer: No Typology Code available for payment source

## 2022-01-30 DIAGNOSIS — Z7189 Other specified counseling: Secondary | ICD-10-CM

## 2022-01-30 LAB — TYPE AND SCREEN
ABO/RH(D): A POS
Antibody Screen: POSITIVE
Donor AG Type: NEGATIVE
Donor AG Type: NEGATIVE
Unit division: 0
Unit division: 0

## 2022-01-30 LAB — GLUCOSE, CAPILLARY
Glucose-Capillary: 212 mg/dL — ABNORMAL HIGH (ref 70–99)
Glucose-Capillary: 114 mg/dL — ABNORMAL HIGH (ref 70–99)
Glucose-Capillary: 223 mg/dL — ABNORMAL HIGH (ref 70–99)
Glucose-Capillary: 163 mg/dL — ABNORMAL HIGH (ref 70–99)

## 2022-01-30 LAB — BPAM RBC
Blood Product Expiration Date: 202310182359
Blood Product Expiration Date: 202310202359
Unit Type and Rh: 6200
Unit Type and Rh: 6200

## 2022-01-30 NOTE — Progress Notes (Signed)
Speech Language Pathology Daily Session Note  Patient Details  Name: Lauren [name] MRN: [id number] Date of Birth: [birth year]  Today's Date: 01/30/2022 SLP Individual Time: 0810-0850 SLP Individual Time Calculation (min): 40 min  Short Term Goals: Week 1: SLP Short Term Goal 1 (Week 1): Pt will demonstrate tolerance of regular diet textures with thin liquids with minimal to no s/sx concerning for airway compromise when given Sup A for implementation of safe swallowing strategies (small + single bites/sips, slow rate). SLP Short Term Goal 2 (Week 1): Pt will demonstrate increased awareness of current deficits s/p CVA by naming 3 physical and/or 3 cognitive deficits given Min A verbal cues. SLP Short Term Goal 3 (Week 1): Pt will recall 3 pieces of novel information following 5 minute delay given Min A verbal cues. SLP Short Term Goal 4 (Week 1): Pt will complete basic visual and verbal problem-solving tasks r/t iADL and ADL completion given Min A verbal cues. SLP Short Term Goal 5 (Week 1): Pt will utilize visual scanning strategies to complete tasks targeting L inattention with 100% completion given Mod A verbal and visual cues.  Skilled Therapeutic Interventions:  Pt was seen for skilled ST targeting cognitive goals.  Upon arrival, pt was sitting upright in bed with daughter at bedside.  Pt reported that she felt that her incontinence brief was wet.  SLP provided assistance in hygiene and donning clean incontinence brief at bed level.  During cleaning, pt was noted to have a small episode of bowel incontinence when coughing which pt attributes to being on a stool softener.  Once cleaned and repositioned in bed, pt needed min cues to make eye contact with therapist who was standing at midline at the foot of her bed.  Pt verbalized awareness of her left inattention and could verbally identify resultant safety hazards with min question cues but pt denies any overt cognitive changes post stroke.  SLP  reviewed and reinforced rationale for ST interventions while inpatient for maximizing potential recovery.  All questions were answered to pt's and daughter's satisfaction at this time.  Pt was left in bed with bed alarm set and daughter at bedside.  Continue per current plan of care.    Pain Pain Assessment Pain Scale: 0-10 Pain Score: 0-No pain   Therapy/Group: Individual Therapy  [name], Selinda Orion 01/30/2022, 11:34 AM

## 2022-01-30 NOTE — Progress Notes (Signed)
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  She was initially admitted under PCCM service, extubated on 01/18/2022 and transferred to [facility] on 01/21/2022.  Palliative care was asked to get involved to further address goals of care in the setting of a large stroke.   Today's Discussion 01/30/2022  *Please note that this is a verbal dictation therefore any spelling or grammatical errors are due to the "[employer]" system interpretation.  Chart reviewed inclusive of vital signs, progress notes, laboratory results, and diagnostic images.   I spoke to patients RN, Fabiola and Dr. Engler this morning.   I met with Ambermarie and her daughter, [name] at bedside this morning. She was vocal about her lack of sleep overnight. She shares the nursing staff tends to turn the lights on bright and not place the computer in sleep mode which makes it difficult to sleep. In addition she shares that overnight she felt cold but could not get a blanket. We discussed that she prefers the warmer type of blankets which her family has brought her and placed in her closet. We discussed the use of sleep aids which will be utilized tonight.   Discussed that the goals today are to wash patients hair and get knots out. While in the room the OT team came in and shared that they would get patient started regarding this effort.   Questions and concerns addressed/Palliative Support Provided.   Objective Assessment: Vital Signs Vitals:   01/29/22 1959 01/30/22 0405  BP: 124/75 121/76  Pulse: 89 83  Resp: 18 18  Temp: 98.1 F (36.7 C) (!) 97.5 F (36.4 C)  SpO2: 100% 100%    Intake/Output Summary  (Last 24 hours) at 01/30/2022 1026 Last data filed at 01/30/2022 0947 Gross per 24 hour  Intake 477 ml  Output 750 ml  Net -273 ml   Last Weight  Most recent update: 01/30/2022  4:03 AM    Weight  82.8 kg (182 lb 8.7 oz)            Gen:  Adult Caucasian F in NAD HEENT: coretrack in place, moist mucous membranes CV: Regular rate and rhythm  PULM: ON RA breathing is even and nonlabored ABD: soft/nontender  EXT: No edema Neuro: Alert and oriented x3. Strong BLU/BLE movement. Little movement to LUE/LLE   SUMMARY OF RECOMMENDATIONS   DNAR/DNI   MOST and Advanced Directives --> completed on 01/27/22  Trazodone for insomnia  Goals are for functional improvement   Ongoing incremental Palliative support  Billing based on MDM: High ______________________________________________________________________________________   [place] Palliative Medicine Team Team Cell Phone: [phone] Please utilize secure chat with additional questions, if there is no response within 30 minutes please call the above phone number  Palliative Medicine Team providers are available by phone from 7am to 7pm daily and can be reached through the team cell phone.  Should this patient require assistance outside of these hours, please call the patient's attending physician.     

## 2022-01-30 NOTE — Progress Notes (Signed)
Physical Therapy Session Note  Patient Details  Name: Lauren [name] MRN: [id number] Date of Birth: 26-Dec-1980  Today's Date: 01/30/2022 PT Individual Time: 1320-1400 PT Individual Time Calculation (min): 40 min   Short Term Goals: Week 1:  PT Short Term Goal 1 (Week 1): Pt will perform bed mobility with increased attn to LUE and consistent MinA. PT Short Term Goal 2 (Week 1): pt will perform sit<>stand with CGA and improved midline positioning. PT Short Term Goal 3 (Week 1): Pt will perform stand pivot transfers with consistent minA. PT Short Term Goal 4 (Week 1): Pt will initiate gait training using LRAD. PT Short Term Goal 5 (Week 1): Pt will initiate stair training.   Skilled Therapeutic Interventions/Progress Updates:   Pt received supine in bed on bed pan. PT returned and pt off bed pan and agreeable to PT. Supine>sit transfer with mod assist at trunk and cues for awareness of the LLE/LUE. Squat pivot transfer to [name] cues for RUE position. Pt transported to rehab gym in [name]. Sit<>stand with min-mod assist, visual feedback from mirror and RUE supported on rail. Pt able ot remain standing 2x 30sec with encouragement from PT. Pt then performed gait at rail in hall x 63ft with mod assist to advance the LLE. Additional sit<>stand at rail x 2 with pt reporting mild orthostatic s/s. Seated BP 95/65. Attempted standing BP, but pt able able to tolerate 10sec in standing. PB reading 77/55. PT obtained ted hose. When L foot exposed. 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 RN and MD notified. Pt continues to report feeling "faint-ish" transported to room. MD present to assess wound and recommends NWB until podiatry able to re-assess. Stand pivot transfer with mod assist and WB through heel only, as pt unable to adhere to NWB. Sit>supine with min assist at the LLE. Pt reports decreased orthostatic hypotension s/s once in bed. RN present to perform wound assessment and dressing.      Therapy  Documentation Precautions:  Precautions Precautions: Fall Precaution Comments: L inattention, R gaze preference, L hemiplegia, no dressing to L foot - wear post-op shoe OOB Required Braces or Orthoses: Other Brace Other Brace: dressing on L foot Restrictions Weight Bearing Restrictions: No General: PT Amount of Missed Time (min): 20 Minutes PT Missed Treatment Reason: Toileting  Pain: Pain Assessment Pain Scale: 0-10 Pain Score: 0-No pain   Therapy/Group: Individual Therapy  [name] 01/30/2022, 2:42 PM

## 2022-01-30 NOTE — Progress Notes (Signed)
Occupational Therapy Session Note  Patient Details  Name: Lauren [name] MRN: [id number] Date of Birth: [birth year]  Today's Date: 01/30/2022 OT Individual Time: 1000-1103 OT Individual Time Calculation (min): 63 min    Short Term Goals: Week 1:  OT Short Term Goal 1 (Week 1): Pt to be Mod A for UB dressing OT Short Term Goal 2 (Week 1): Pt to be Mod A for LB dressing OT Short Term Goal 3 (Week 1): Pt to be Mod A for toileting OT Short Term Goal 4 (Week 1): Pt to demonstrate improved attention to L side with no more than 3 verbal cues OT Short Term Goal 5 (Week 1): Pt to perform functional transfers with Min A  Skilled Therapeutic Interventions/Progress Updates:    Pt received supine in bed with family present. Pt and family reporting concerns with hair being tangled. OT offered shower with pt initially agreeable. OT retrieved conditioning shower cap and materials then sat EOB with pt requiring min A for transition. Pt sustained static sitting balance up to 1 min prior to LOB posteriorly and/or to the left with total A to correct. Pt declining shower d/t fatigue and requesting to lay down. Distracted with hair care focusing on OOB tolerance, sitting balance, and midline orientation. Pt sat unsupported EOB ~15 min prior to transitioning to w/c with mod A squat pivot. Continued hair hygiene requiring max cues to locate items on L side. OT retrieve L arm tray and educated family on safety with using staff to transition pt back to bed when she was ready. Pt left with all needs in reach and family present.   Therapy Documentation Precautions:  Precautions Precautions: Fall Precaution Comments: L inattention, R gaze preference, L hemiplegia, no dressing to L foot - wear post-op shoe OOB Required Braces or Orthoses: Other Brace Other Brace: dressing on L foot Restrictions Weight Bearing Restrictions: No General:   Vital Signs:  Pain: Pain Assessment Pain Scale: 0-10 Pain Score: 6  Pain  Type: Acute pain;Surgical pain Pain Location: Foot Pain Orientation: Left Pain Intervention(s): Medication (See eMAR) ADL: ADL Eating: Minimal assistance, Moderate cueing Where Assessed-Eating: Bed level Grooming: Minimal assistance, Moderate cueing Where Assessed-Grooming: Bed level Upper Body Bathing: Minimal assistance, Moderate cueing Where Assessed-Upper Body Bathing: Other (Comment) (BSC) Lower Body Bathing: Moderate assistance, Moderate cueing (For balance to wash peri areas) Where Assessed-Lower Body Bathing: Other (Comment) (BSC) Upper Body Dressing: Maximal assistance, Moderate cueing Where Assessed-Upper Body Dressing: Other (Comment), Edge of bed (BSC) Lower Body Dressing: Maximal assistance, Moderate cueing Where Assessed-Lower Body Dressing: Edge of bed, Other (Comment) (BSC) Toileting: Moderate cueing Where Assessed-Toileting: Bedside Commode Toilet Transfer: Maximal assistance, Moderate verbal cueing Toilet Transfer Method: Squat pivot Toilet Transfer Equipment: Drop arm bedside commode Tub/Shower Transfer: Unable to assess Gaffer Transfer: Unable to assess (Anticipate Mod A) Vision   Perception    Praxis   Balance   Exercises:   Other Treatments:     Therapy/Group: Individual Therapy  Joetta Delprado, Quillian Quince 01/30/2022, 11:06 AM

## 2022-01-30 NOTE — Progress Notes (Signed)
PROGRESS NOTE   Subjective/Complaints:  Patient seen at bedside with daughter, husband present. Family states patient is not feeling great today, frustrated regarding hair knots. Daughter requests hospital diet liberalization as "she won't eat any of the hospital food, the only stuff she's eating is stuff we bring in for her."   Paged by PT during AM session stating family noted L 5th digit amputation site now open, family endorsing changed appearance from prior exams. Family at [facility] side with orthopedic shoe, state patient was not wearing at time of wound dehiscence with ambulation.   ROS:  ROS: Denies fevers, chills, N/V, abdominal pain, constipation, diarrhea, SOB, cough, chest pain, new weakness or paraesthesias.     Objective:   DG Foot 2 Views Left  Result Date: 01/30/2022 CLINICAL DATA:  Postoperative state. EXAM: LEFT FOOT - 2 VIEW COMPARISON:  Left foot radiographs 12/14/2021 FINDINGS: Redemonstration of amputation of the fifth ray to the level of the fifth metatarsal mid shaft. There is a new apparent acute to subacute comminuted fracture of the distal shaft of the fourth metatarsal with up to approximately 5 mm medial displacement of the distal fracture component with respect to the proximal fracture component and mild medial apex angulation at the fracture site. A thin longitudinal 15 x 2 mm lateral cortical fracture fragment remains only minimally displaced at the lateral aspect of the fracture. Mild vascular calcifications. IMPRESSION: 1. 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 This is not significantly changed. 2. New acute to subacute, comminuted, mildly displaced, and mildly angulated fracture of the distal shaft of the fourth metatarsal. Electronically Signed   By: Yvonne Kendall M.D.   On: 01/30/2022 17:32   Recent Labs    01/28/22 0500 01/29/22 0636  WBC 11.0* 9.5  HGB 8.1*  8.2*  HCT 25.5* 25.4*  PLT 665* 635*    Recent Labs    01/28/22 0500 01/29/22 0636  NA 134* 137  K 3.9 4.0  CL 101 106  CO2 25 26  GLUCOSE 211* 312*  BUN 47* 40*  CREATININE 1.98* 1.88*  CALCIUM 8.4* 8.4*     Intake/Output Summary (Last 24 hours) at 01/30/2022 2145 Last data filed at 01/30/2022 1745 Gross per 24 hour  Intake 720 ml  Output 750 ml  Net -30 ml        Physical Exam: Vital Signs Blood pressure 135/84, pulse 99, temperature 98.3 F (36.8 C), resp. rate 18, weight 82.8 kg, last menstrual period 11/01/2021, SpO2 100 %.   Constitutional: No apparent distress. Appropriate appearance for age.  HENT: No JVD. Neck Supple.[place], AT, MMM.  Eyes: PERRLA Cardiovascular: RRR, no murmurs/rub/gallops. No Edema. Peripheral pulses 2+  Respiratory: CTAB. No rales, rhonchi, or wheezing. On RA.  Abdomen: Soft,  + bowel sounds, NT, ND  Skin: + L 5th toe amputation; wound site open with mild serous drainage, clean wound bed, no erythema or warmth.    PE from prior encounter: MSK:      No apparent deformity.      Strength:                RUE: 5/5  SA, 5/5 EF, 5/5 EE, 5/5 WE, 5/5 FF, 5/5 FA                 LUE: flaccid, 0/5 throughout                RLE: 5/5 HF, 5/5 KE, 5/5 DF, 5/5 EHL, 5/5 PF                 LLE:  3/5 HF, 2/5 KE, 0/5 DF, 0/5 EHL, 1/5 PF    Neurologic exam:  Cognition: AAO to person, place, time and event.  Language: Fluent, No substitutions or neoglisms.Moderate dysarthria Mood: Pleasant affect, appropriate mood.  Sensation: To light touch intact in BL UEs and LEs  CN: Left facial droop   Assessment/Plan: 1. Functional deficits which require 3+ hours per day of interdisciplinary therapy in a comprehensive inpatient rehab setting. Physiatrist is providing close team supervision and 24 hour management of active medical problems listed below. Physiatrist and rehab team continue to assess barriers to discharge/monitor patient progress toward functional  and medical goals  Care Tool:  Bathing    Body parts bathed by patient: Right arm, Left arm, Chest, Abdomen, Buttocks, Right upper leg, Left upper leg, Face   Body parts bathed by helper: Front perineal area, Right lower leg, Left lower leg     Bathing assist Assist Level: Moderate Assistance - Patient 50 - 74%     Upper Body Dressing/Undressing Upper body dressing   What is the patient wearing?: Bra, Pull over shirt    Upper body assist Assist Level: Maximal Assistance - Patient 25 - 49%    Lower Body Dressing/Undressing Lower body dressing      What is the patient wearing?: Incontinence brief, Pants     Lower body assist Assist for lower body dressing: Maximal Assistance - Patient 25 - 49%     Toileting Toileting    Toileting assist Assist for toileting: Maximal Assistance - Patient 25 - 49%     Transfers Chair/bed transfer  Transfers assist     Chair/bed transfer assist level: Moderate Assistance - Patient 50 - 74%     Locomotion Ambulation   Ambulation assist   Ambulation activity did not occur: Safety/medical concerns          Walk 10 feet activity   Assist  Walk 10 feet activity did not occur: Safety/medical concerns        Walk 50 feet activity   Assist Walk 50 feet with 2 turns activity did not occur: Safety/medical concerns         Walk 150 feet activity   Assist Walk 150 feet activity did not occur: Safety/medical concerns         Walk 10 feet on uneven surface  activity   Assist Walk 10 feet on uneven surfaces activity did not occur: Safety/medical concerns         Wheelchair     Assist Is the patient using a wheelchair?: Yes Type of Wheelchair: Manual    Wheelchair assist level: Total Assistance - Patient < 25% Max wheelchair distance: 300 ft    Wheelchair 50 feet with 2 turns activity    Assist        Assist Level: Total Assistance - Patient < 25%   Wheelchair 150 feet activity      Assist      Assist Level: Total Assistance - Patient < 25%   Blood pressure 135/84, pulse 99, temperature 98.3 F (36.8 C), resp. rate  18, weight 82.8 kg, last menstrual period 11/01/2021, SpO2 100 %.  Medical Problem List and Plan: 1. 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             -patient may shower with lines and foot wound covered             -ELOS/Goals: 14-21 days  -Continue CIR  2.  Antithrombotics: -DVT/anticoagulation:  Pharmaceutical: Lovenox             -antiplatelet therapy: Brilinta 3. Pain Management: Tylenol, oxycodone as needed 4. Mood/Behavior/Sleep: LCSW to evaluate and provide support             -continue melatonin 3 mg q HS             -antipsychotic agents: n/a 5. Neuropsych/cognition: This patient is capable of making decisions on her own behalf. 6. 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 Fluids/Electrolytes/Nutrition: Strict Is and Os and follow-up chemistries             -dys 3/thin liquids; SLP eval             -repeat mag/phos tomorrow 8: Right ICA dissection with stent placement: on Brilinta 9: Hypertension: monitor             -continue hydralazine 12.5 mg TID             -continue isosorbide 20 mg BID             -continue Lopressor 37.5 mg BID 10: Hyperlipidemia: continue Lipitor 40 mg daily 11: Coagulopathy s/p RUE embolectomy 10/2021: Eliquis PTA now on Lovenox; f                -follow-up hematology as outpt 12: COPD exacerbation: hypoxia resolved; continue bronchodilators/nebs 13: Chronic diastolic and systolic heart failure:             -strict Is and Os and daily weight             -no diuretics currently             -see meds above   Filed Weights   01/29/22 0334 01/30/22 0402  Weight: 90.7 kg 82.8 kg  - daily weight  unreliable  14: Hypothyroidism: continue Synthroid 125 mcg daily; follow-up TSH 15: 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 Harl Bowie 21: Left foot osteomyelitis s/p ray amputation [name] -recent bone biopsy; cultures neg - Husband to bring in ?CAM boot for weightbearing, protection - Remove IJ order - 9/30 Noted surgical site dehiscence with PT ambulation,  was not wearing orthotic. Podiatry consulted, imaging showing mildly angulated fracture of the distal fourth metatarsal (likely iatrogenic from prior amputation)               - Orders for betadyne dressing daily               - NWB LLE               - Appreciate podiatry recs, to discuss 4th MT fx as group next week 22: Leukocytosis: trending down; follow-up CBC  -9/29 WBC 9/5 today, improved 23. Dysphagia  -Advanced to heart healthy/carb modified/ thin liquids            - 9/30 - family requesting diet liberalization as patient primarily eating foods brought in from outside d/t hospital food bland; carb modified diet unable to be ordered with less restrictive parameters. Dietary consulted.     LOS: 2 days A FACE TO [place] 01/30/2022, 9:45 PM

## 2022-01-30 NOTE — Progress Notes (Signed)
PODIATRY PROGRESS NOTE Patient Name: Lauren [name]  DOB July 29, 1980 DOA 01/28/2022  Hospital Day: 3  Assessment:  41 y.o. 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 No evidence of new dehiscence or soft tissue infection.   Also incidentally found to have a displaced left 4th metatarsal neck fracture, likely iatrogenic 2/2 bone biopsy procedure performed 01/16/22.   XR Foot  Left 01/30/22: 1. 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 This is not significantly changed. 2. New acute to subacute, comminuted, mildly displaced, and mildly angulated fracture of the distal shaft of the fourth metatarsal.  Plan:  - Continue local wound care with betadine dressings to be applied to the left forefoot daily. Will need bedside debridement, likely return tomorrow to perform in next day or two - No evidence of infection at the amputation site.   In [place] to the 4th metatarsal neck fracture will need to discuss the case further with other providers in the group who are more familiar with the patients case. Will likely plan for nonoperative management of this fracture  - Pt to remain NWB to LLE for time being related to 4th metatarsal fracture. Will continue to follow        Everitt Amber, DPM [facility]    Subjective:  41 y.o. female status post bedside bone biopsy performed 01/16/2022 by Dr. Amalia Hailey to assist with antibiotic coverage of osteomyelitis to the left foot managed by infectious disease. Per report she was working with PT in the gym when it was noted that her L 5th metatarsal amputation site ([name] performed in May) had dehisced. Has been  in inpatient rehab at [facility] after a stroke.   Pt states the wound busted open because she was not wearing her postop shoe while ambulating with therapist today. Family bedside says the area never fully healed and has been open since surgery at that location chronically, mostly unchanged from prior.   Objective:   Vitals:   01/30/22 0405 01/30/22 1531  BP: 121/76 (!) 106/56  Pulse: 83 91  Resp: 18 16  Temp: (!) 97.5 F (36.4 C) 97.9 F (36.6 C)  SpO2: 100% 100%       Latest Ref Rng & Units 01/29/2022    6:36 AM 01/28/2022    5:00 AM 01/27/2022    5:30 AM  CBC  WBC 4.0 - 10.5 K/uL 9.5  11.0  11.2   Hemoglobin 12.0 - 15.0 g/dL 8.2  8.1  8.1   Hematocrit 36.0 - 46.0 % 25.4  25.5  24.9   Platelets 150 - 400 K/uL 635  665  671        Latest Ref Rng & Units 01/29/2022    6:36 AM 01/28/2022    5:00 AM 01/27/2022    5:30 AM  BMP  Glucose 70 - 99 mg/dL 312  211  134   BUN 6 - 20 mg/dL 40  47  53   Creatinine 0.44 - 1.00 mg/dL 1.88  1.98  1.87   Sodium 135 - 145 mmol/L 137  134  139   Potassium 3.5 - 5.1 mmol/L 4.0  3.9  3.9  Chloride 98 - 111 mmol/L 106  101  101   CO2 22 - 32 mmol/L 26  25  28    Calcium 8.9 - 10.3 mg/dL 8.4  8.4  8.8     General: AAOx3, NAD  Lower Extremity Exam Vasc: R - PT palpable, DP palpable. Cap refill < 3 sec to digits  L - PT palpable, DP palpable. Cap refill <3 sec to digits  Derm: R - Normal temp/texture/turgor with no open lesion or clinical signs of infection   L - Hyperkeratotic tissue present at the distal 5th ray amputation site. Small linear area of ulceration to level of subcutaneous tissue at the incision line distally likely area of nonhealing from surg vs dehiscence of closed amp site. No erythema drainage or malodor associate with this area.       MSK:  R -  No gross deformities. Compartments soft, non-tender, compressible  L - S/p partial 5th ray amputation, mild edema of the forefoot, mildly tender to palpation about the amputation  site and 4th ray  Neuro: R - Gross sensation diminished. Gross motor function intact   L - Gross sensation diminished. Gross motor function intact   Radiology:  Results reviewed. See assessment for pertinent imaging results

## 2022-01-30 NOTE — Plan of Care (Signed)
  Problem: RH Balance Goal: LTG Patient will maintain dynamic sitting balance (PT) Description: LTG:  Patient will maintain dynamic sitting balance with assistance during mobility activities (PT) Flowsheets (Taken 01/29/2022 1843) LTG: Pt will maintain dynamic sitting balance during mobility activities with:: Independent Goal: LTG Patient will maintain dynamic standing balance (PT) Description: LTG:  Patient will maintain dynamic standing balance with assistance during mobility activities (PT) Flowsheets (Taken 01/29/2022 1843) LTG: Pt will maintain dynamic standing balance during mobility activities with:: Supervision/Verbal cueing   Problem: Sit to Stand Goal: LTG:  Patient will perform sit to stand with assistance level (PT) Description: LTG:  Patient will perform sit to stand with assistance level (PT) Flowsheets (Taken 01/29/2022 1843) LTG: PT will perform sit to stand in preparation for functional mobility with assistance level: Supervision/Verbal cueing   Problem: RH Bed Mobility Goal: LTG Patient will perform bed mobility with assist (PT) Description: LTG: Patient will perform bed mobility with assistance, with/without cues (PT). Flowsheets (Taken 01/29/2022 1843) LTG: Pt will perform bed mobility with assistance level of: Independent with assistive device    Problem: RH Bed to Chair Transfers Goal: LTG Patient will perform bed/chair transfers w/assist (PT) Description: LTG: Patient will perform bed to chair transfers with assistance (PT). Flowsheets (Taken 01/29/2022 1843) LTG: Pt will perform Bed to Chair Transfers with assistance level: Contact Guard/Touching assist   Problem: RH Car Transfers Goal: LTG Patient will perform car transfers with assist (PT) Description: LTG: Patient will perform car transfers with assistance (PT). Flowsheets (Taken 01/29/2022 1843) LTG: Pt will perform car transfers with assist:: Contact Guard/Touching assist   Problem: RH Furniture  Transfers Goal: LTG Patient will perform furniture transfers w/assist (OT/PT) Description: LTG: Patient will perform furniture transfers  with assistance (OT/PT). Flowsheets (Taken 01/29/2022 1843) LTG: Pt will perform furniture transfers with assist:: Contact Guard/Touching assist   Problem: RH Ambulation Goal: LTG Patient will ambulate in controlled environment (PT) Description: LTG: Patient will ambulate in a controlled environment, # of feet with assistance (PT). Flowsheets (Taken 01/29/2022 1843) LTG: Pt will ambulate in controlled environ  assist needed:: Contact Guard/Touching assist LTG: Ambulation distance in controlled environment: at least 150 ft using LRAD Goal: LTG Patient will ambulate in home environment (PT) Description: LTG: Patient will ambulate in home environment, # of feet with assistance (PT). Flowsheets (Taken 01/29/2022 1843) LTG: Pt will ambulate in home environ  assist needed:: Contact Guard/Touching assist LTG: Ambulation distance in home environment: up to 50 ft using LRAD   Problem: RH Wheelchair Mobility Goal: LTG Patient will propel w/c in controlled environment (PT) Description: LTG: Patient will propel wheelchair in controlled environment, # of feet with assist (PT) Flowsheets (Taken 01/29/2022 1843) LTG: Pt will propel w/c in controlled environ  assist needed:: Supervision/Verbal cueing LTG: Propel w/c distance in controlled environment: at least 150 ft   Problem: RH Stairs Goal: LTG Patient will ambulate up and down stairs w/assist (PT) Description: LTG: Patient will ambulate up and down # of stairs with assistance (PT) Flowsheets (Taken 01/29/2022 1843) LTG: Pt will ambulate up/down stairs assist needed:: Contact Guard/Touching assist LTG: Pt will  ambulate up and down number of stairs: at least 4 steps using HR setup as per home environment

## 2022-01-30 NOTE — Progress Notes (Signed)
Occupational Therapy Session Note  Patient Details  Name: Lauren [name] MRN: [id number] Date of Birth: 1980/11/15  Today's Date: 01/30/2022 OT Individual Time: 1445-1520 OT Individual Time Calculation (min): 35 min    Short Term Goals: Week 1:  OT Short Term Goal 1 (Week 1): Pt to be Mod A for UB dressing OT Short Term Goal 2 (Week 1): Pt to be Mod A for LB dressing OT Short Term Goal 3 (Week 1): Pt to be Mod A for toileting OT Short Term Goal 4 (Week 1): Pt to demonstrate improved attention to L side with no more than 3 verbal cues OT Short Term Goal 5 (Week 1): Pt to perform functional transfers with Min A  Skilled Therapeutic Interventions/Progress Updates:    Upon OT arrival, pt sidelying in bed with spouse present at bedside. Pt easily awakened by verbal stimulation and reports pain in the L LE after wound site was recently broke open in PT session per pt and spouse report. Per PT note, pt NWB to L LE until podiatry reassesses pt's L LE. Pt also reports not wanting to put weight through the LE until podiatry visits. Pt agreeable to OT treatment EOB. Pt completes supine to sit transfer with Min A requiring mod verbal and tactile cues to sequence task as pt tends to have inattention to the L side and decreased midline orientation. While seated EOB with CGA, pt folds 5 washcloths using the R UE only. Pt with min difficulty to sequence task and requires mod verbal cues to attend to the L side of tabletop to retrieve washcloths. During task, pt with c/o "feeling like she needs to throw up". Emesis bag provided and pt returns to supine with CGA and lightheadedness resolves. BP taken while supine reading 110/77. Pt then reports chest pain at 6/10 while supine and increases to an 8/10. RN notified and session was ended. Pt missed 10 minutes of OT treatment time. Pt was left in bed at end of session with all needs met and RN present.   Therapy Documentation Precautions:  Precautions Precautions:  Fall Precaution Comments: L inattention, R gaze preference, L hemiplegia, no dressing to L foot - wear post-op shoe OOB Required Braces or Orthoses: Other Brace Other Brace: dressing on L foot Restrictions Weight Bearing Restrictions: No General: General PT Missed Treatment Reason: Toileting Vital Signs: 110/77 BP, reports chest pain 6/10    Therapy/Group: Individual Therapy  Marvetta Gibbons 01/30/2022, 3:16 PM

## 2022-01-31 LAB — HEPARIN ANTI-XA: Heparin LMW: 0.93 IU/mL

## 2022-01-31 LAB — GLUCOSE, CAPILLARY
Glucose-Capillary: 213 mg/dL — ABNORMAL HIGH (ref 70–99)
Glucose-Capillary: 243 mg/dL — ABNORMAL HIGH (ref 70–99)
Glucose-Capillary: 160 mg/dL — ABNORMAL HIGH (ref 70–99)
Glucose-Capillary: 178 mg/dL — ABNORMAL HIGH (ref 70–99)

## 2022-01-31 NOTE — IPOC Note (Signed)
Overall Plan of Care [facility]) Patient Details Name: Lauren [name] MRN: [id number] DOB: 1980-07-13  Admitting Diagnosis: Acute right MCA stroke [facility])  Hospital Problems: Principal Problem:   Acute right MCA stroke [facility])     Functional Problem List: Nursing Bladder, Bowel, Pain, Perception, Safety, Edema, Endurance, Sensory, Medication Management, Skin Integrity, Motor  PT Balance, Endurance, Motor, Nutrition, Perception, Safety, Sensory  OT Balance, Cognition, Edema, Endurance, Motor, Perception, Safety, Vision  SLP Cognition, Motor  TR         Basic ADL's: OT Eating, Grooming, Bathing, Dressing, Toileting     Advanced  ADL's: OT Light Housekeeping     Transfers: PT Bed Mobility, Bed to Chair, Car, Patent attorney, Agricultural engineer: PT Ambulation, Emergency planning/management officer, Stairs     Additional Impairments: OT Fuctional Use of Upper Extremity  SLP Social Cognition   Problem Solving, Memory, Attention, Awareness  TR      Anticipated Outcomes Item Anticipated Outcome  Self Feeding Supervision  Swallowing  Mod I   Basic self-care  Min A  Toileting  Min A   Bathroom Transfers CGA  Bowel/Bladder  continent x 2 LBM 09/27  Transfers  CGA  Locomotion  CGA  Communication  N/A  Cognition  Sup A  Pain  less than 3  Safety/Judgment  remain fall free while in rehab   Therapy Plan: PT Intensity: Minimum of 1-2 x/day ,45 to 90 minutes PT Frequency: 5 out of 7 days PT Duration Estimated Length of Stay: 14-21 days OT Intensity: Minimum of 1-2 x/day, 45 to 90 minutes OT Frequency: 5 out of 7 days OT Duration/Estimated Length of Stay: 3-4 weeks SLP Intensity: Minumum of 1-2 x/day, 30 to 90 minutes SLP Frequency: 3 to 5 out of 7 days SLP Duration/Estimated Length of Stay: 2 weeks   Team Interventions: Nursing Interventions Patient/Family Education, Disease Management/Prevention, Skin Care/Wound Management, Discharge Planning, Bladder Management, Pain  Management, Cognitive Remediation/Compensation, Psychosocial Support, Bowel Management, Medication Management, Dysphagia/Aspiration Precaution Training  PT interventions Ambulation/gait training, Community reintegration, DME/adaptive equipment instruction, Neuromuscular re-education, Psychosocial support, Stair training, UE/LE Strength taining/ROM, Wheelchair propulsion/positioning, UE/LE Coordination activities, Therapeutic Activities, Skin care/wound management, Pain management, Functional electrical stimulation, Discharge planning, Training and development officer, Cognitive remediation/compensation, Disease management/prevention, Functional mobility training, Patient/family education, Splinting/orthotics, Therapeutic Exercise, Visual/perceptual remediation/compensation  OT Interventions Balance/vestibular training, Neuromuscular re-education, Self Care/advanced ADL retraining, Therapeutic Exercise, Wheelchair propulsion/positioning, Cognitive remediation/compensation, DME/adaptive equipment instruction, UE/LE Strength taining/ROM, Community reintegration, Functional electrical stimulation, Patient/family education, Splinting/orthotics, UE/LE Coordination activities, Discharge planning, Functional mobility training, Psychosocial support, Therapeutic Activities, Visual/perceptual remediation/compensation  SLP Interventions Cognitive remediation/compensation, Environmental controls, Functional tasks, Internal/external aids, Patient/family education, Dysphagia/aspiration precaution training  TR Interventions    SW/CM Interventions Discharge Planning, Psychosocial Support, Patient/Family Education   Barriers to Discharge MD  Medical stability, Home enviroment access/loayout, Incontinence, Wound care, Lack of/limited family support, Weight, Weight bearing restrictions, Medication compliance, Pending surgery, and Behavior  Nursing Decreased caregiver support, IV antibiotics, Incontinence, Wound Care, Insurance for  SNF coverage, Weight mobile home 1 level with 5 ste rails on r/l  PT Inaccessible home environment, Decreased caregiver support, Home environment access/layout, Wound Care, Lack of/limited family support, Insurance for SNF coverage, Weight, Behavior, Nutrition means    OT Inaccessible home environment 4-6 steps  SLP      SW Insurance for SNF coverage     Team Discharge Planning: Destination: PT-Home ,OT- Home , SLP-Home Projected Follow-up: PT-24 hour supervision/assistance, Home health PT, OT-  Home health OT, SLP-Home Health  SLP, Outpatient SLP, 24 hour supervision/assistance Projected Equipment Needs: PT-To be determined, OT- 3 in 1 bedside comode, Tub/shower bench, SLP-None recommended by SLP Equipment Details: PT- , OT-  Patient/family involved in discharge planning: PT- Patient, Family member/caregiver,  OT-Patient, Family member/caregiver, SLP-Patient, Family member/caregiver  MD ELOS: 10-14 days Medical Rehab Prognosis:  Good Assessment: The patient has been admitted for CIR therapies with the diagnosis of R MCA stroke. The team will be addressing functional mobility, strength, stamina, balance, safety, adaptive techniques and equipment, weightbearing restrictions, self-care, bowel and bladder mgt, patient and caregiver education, behavioral modification, cognitive adaptation techniques. Goals have been set at [facility]. Anticipated discharge destination is home.       See Team Conference Notes for weekly updates to the plan of care     Gertie Gowda, DO 01/31/2022

## 2022-01-31 NOTE — Progress Notes (Signed)
PODIATRY PROGRESS NOTE Patient Name: Lauren [name]  DOB [birth year] DOA 01/28/2022  Hospital Day: 4  Assessment:  41 y.o. 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 No evidence of new dehiscence or soft tissue infection.   Also incidentally found to have a displaced left 4th metatarsal neck fracture, likely iatrogenic 2/2 bone biopsy procedure performed 01/16/22.   XR Foot  Left 01/30/22: 1. 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 This is not significantly changed. 2. New acute to subacute, comminuted, mildly displaced, and mildly angulated fracture of the distal shaft of the fourth metatarsal.  Plan:  - Continue local wound care with betadine dressings to be applied to the left forefoot daily. Will benefit from superficial bedside debridement, likely in next 1-2 days - was attempting to perform today but patient was not in room during rounding, per staff she has grounds pass and the family takes her outside for fresh air - Updated wound care orders for daily RN dsg changes entered in epic - No evidence of infection at the amputation site when seen yesterday  In Regards to the 4th metatarsal neck fracture, current plan is for non operative mgmt with NWB to LLE in CAM boot, believe patient has one already just needs to be brought in by family - Pt to remain NWB to LLE for now, may upgrade to WBAT once CAM boot brought in for pt Will continue to follow        Everitt Amber, [place]    Subjective:  Pt was not in room during rounds, out somewhere on the hospital grounds with family per staff  Objective:   Vitals:   01/31/22 0409  01/31/22 0842  BP: 111/65 105/65  Pulse: 80 77  Resp: 18   Temp: 97.9 F (36.6 C)   SpO2: 100%        Latest Ref Rng & Units 01/29/2022    6:36 AM 01/28/2022    5:00 AM 01/27/2022    5:30 AM  CBC  WBC 4.0 - 10.5 K/uL 9.5  11.0  11.2   Hemoglobin 12.0 - 15.0 g/dL 8.2  8.1  8.1   Hematocrit 36.0 - 46.0 % 25.4  25.5  24.9   Platelets 150 - 400 K/uL 635  665  671        Latest Ref Rng & Units 01/29/2022    6:36 AM 01/28/2022    5:00 AM 01/27/2022    5:30 AM  BMP  Glucose 70 - 99 mg/dL 312  211  134   BUN 6 - 20 mg/dL 40  47  53   Creatinine 0.44 - 1.00 mg/dL 1.88  1.98  1.87   Sodium 135 - 145 mmol/L 137  134  139   Potassium 3.5 - 5.1 mmol/L 4.0  3.9  3.9   Chloride 98 - 111 mmol/L 106  101  101   CO2 22 - 32 mmol/L 26  25  28    Calcium 8.9 - 10.3 mg/dL 8.4  8.4  8.8     General: AAOx3, NAD  Lower Extremity Exam Vasc: R - PT palpable, DP palpable. Cap refill <  3 sec to digits  L - PT palpable, DP palpable. Cap refill <3 sec to digits  Derm: R - Normal temp/texture/turgor with no open lesion or clinical signs of infection   L - Hyperkeratotic tissue present at the distal 5th ray amputation site. Small linear area of ulceration to level of subcutaneous tissue at the incision line distally likely area of nonhealing from surg vs dehiscence of closed amp site. No erythema drainage or malodor associate with this area.       MSK:  R -  No gross deformities. Compartments soft, non-tender, compressible  L - S/p partial 5th ray amputation, mild edema of the forefoot, mildly tender to palpation about the amputation site and 4th ray  Neuro: R - Gross sensation diminished. Gross motor function intact   L - Gross sensation diminished. Gross motor function intact   Radiology:  Results reviewed. See assessment for pertinent imaging results

## 2022-02-01 DIAGNOSIS — I255 Ischemic cardiomyopathy: Secondary | ICD-10-CM

## 2022-02-01 DIAGNOSIS — I959 Hypotension, unspecified: Secondary | ICD-10-CM

## 2022-02-01 LAB — CBC
HCT: 23.1 % — ABNORMAL LOW (ref 36.0–46.0)
Hemoglobin: 7.3 g/dL — ABNORMAL LOW (ref 12.0–15.0)
MCH: 27.1 pg (ref 26.0–34.0)
MCHC: 31.6 g/dL (ref 30.0–36.0)
MCV: 85.9 fL (ref 80.0–100.0)
Platelets: 535 10*3/uL — ABNORMAL HIGH (ref 150–400)
RBC: 2.69 MIL/uL — ABNORMAL LOW (ref 3.87–5.11)
RDW: 16.5 % — ABNORMAL HIGH (ref 11.5–15.5)
WBC: 6.9 10*3/uL (ref 4.0–10.5)
nRBC: 0 % (ref 0.0–0.2)

## 2022-02-01 LAB — BASIC METABOLIC PANEL
Anion gap: 6 (ref 5–15)
BUN: 24 mg/dL — ABNORMAL HIGH (ref 6–20)
CO2: 21 mmol/L — ABNORMAL LOW (ref 22–32)
Calcium: 8.2 mg/dL — ABNORMAL LOW (ref 8.9–10.3)
Chloride: 111 mmol/L (ref 98–111)
Creatinine, Ser: 1.59 mg/dL — ABNORMAL HIGH (ref 0.44–1.00)
GFR, Estimated: 42 mL/min — ABNORMAL LOW (ref >60)
Glucose, Bld: 185 mg/dL — ABNORMAL HIGH (ref 70–99)
Potassium: 3.6 mmol/L (ref 3.5–5.1)
Sodium: 138 mmol/L (ref 135–145)

## 2022-02-01 LAB — GLUCOSE, CAPILLARY
Glucose-Capillary: 207 mg/dL — ABNORMAL HIGH (ref 70–99)
Glucose-Capillary: 56 mg/dL — ABNORMAL LOW (ref 70–99)
Glucose-Capillary: 312 mg/dL — ABNORMAL HIGH (ref 70–99)
Glucose-Capillary: 293 mg/dL — ABNORMAL HIGH (ref 70–99)

## 2022-02-01 MED ORDER — ENOXAPARIN SODIUM 120 MG/0.8ML IJ SOSY
120.0000 mg | PREFILLED_SYRINGE | INTRAMUSCULAR | Status: DC
  Administered 2022-02-01 – 2022-02-18 (×17): 120 mg via SUBCUTANEOUS
  Filled 2022-02-01 (×18): qty 0.8

## 2022-02-01 NOTE — Progress Notes (Signed)
Speech Language Pathology Daily Session Note  Patient Details  Name: Lauren [name] MRN: [id number] Date of Birth: [birth year]  Today's Date: 02/01/2022 SLP Individual Time: 1203-1300 SLP Individual Time Calculation (min): 57 min  Short Term Goals: Week 1: SLP Short Term Goal 1 (Week 1): Pt will demonstrate tolerance of regular diet textures with thin liquids with minimal to no s/sx concerning for airway compromise when given Sup A for implementation of safe swallowing strategies (small + single bites/sips, slow rate). SLP Short Term Goal 2 (Week 1): Pt will demonstrate increased awareness of current deficits s/p CVA by naming 3 physical and/or 3 cognitive deficits given Min A verbal cues. SLP Short Term Goal 3 (Week 1): Pt will recall 3 pieces of novel information following 5 minute delay given Min A verbal cues. SLP Short Term Goal 4 (Week 1): Pt will complete basic visual and verbal problem-solving tasks r/t iADL and ADL completion given Min A verbal cues. SLP Short Term Goal 5 (Week 1): Pt will utilize visual scanning strategies to complete tasks targeting L inattention with 100% completion given Mod A verbal and visual cues.  Skilled Therapeutic Interventions:Skilled ST services focused on swallow and cognitive skills. SLP facilitated po intake of regular textures and thin liquids via straw on lunch tray. Pt required set [name] assist with min A verbal cues to locate items on the left of tray. Pt demonstrated self-feeding and recall of swallow strategies mod I. Pt did demonstrate mild left buccal pocketing and mild left anterior spillage of solids, but demonstrated mod I ability to clear oral cavity with liquid wash and consecutive swallows. SLP reduced supervision to intermittent and clear education provided to NT for positioning, tray set up and checking left pocketing. This reduction in supervision should be closely monitored and downgraded to full supervision if needed. SLP also facilitated left  scanning in cancellation task of 1 letter, pt demonstrated 4/11 accuracy increasing to 6/11 accuracy with verbal cue and max A visual cues to locate 11/11 items. Pt was left in room with call bell within reach and bed alarm set. SLP recommends to continue skilled services.      Pain Pain Assessment Pain Score: 0-No pain  Therapy/Group: Individual Therapy  Lauren [name]  [facility] 02/01/2022, 2:02 PM

## 2022-02-01 NOTE — Progress Notes (Signed)
Orthopedic Tech Progress Note Patient Details:  Lauren [name] [birth year] [id number]  Ortho Devices Type of Ortho Device: CAM walker Ortho Device/Splint Location: LLE Ortho Device/Splint Interventions: Ordered, Application, Adjustment   Post Interventions Patient Tolerated: Well Instructions Provided: Care of device  Janit Pagan 02/01/2022, 3:25 PM

## 2022-02-01 NOTE — Progress Notes (Signed)
Occupational Therapy Session Note  Patient Details  Name: Lauren [name] MRN: [id number] Date of Birth: 1980/12/08  Today's Date: 02/01/2022 OT Individual Time: 0800-0900 OT Individual Time Calculation (min): 60 min    Short Term Goals: Week 1:  OT Short Term Goal 1 (Week 1): Pt to be Mod A for UB dressing OT Short Term Goal 2 (Week 1): Pt to be Mod A for LB dressing OT Short Term Goal 3 (Week 1): Pt to be Mod A for toileting OT Short Term Goal 4 (Week 1): Pt to demonstrate improved attention to L side with no more than 3 verbal cues OT Short Term Goal 5 (Week 1): Pt to perform functional transfers with Min A  Skilled Therapeutic Interventions/Progress Updates:  Pt seen for skilled OT session this AM. NT was transferring pt from w/c via STEADY to toilet and OT arrived and took over AM self care training. Status as follows: max A UB dressing, LB bathing and dressing, max a toileting and transfer, min A grooming w/c level, limited awarenss of L UE and management of extremity as well as decreased scanning and overall insight. Pt's husband arrived and OT discussed plan and current status and will convey at Team Conf tomorrow. OT obtained a different w/c for laptray and PT to exchange due to lack of time. Pt requires L resting hand splint and OT to request order from MD for night use. Pt left with husband with chair exit alarm active, needs and nurse call button in reach.   Therapy Documentation Precautions:  Precautions Precautions: Fall Precaution Comments: L inattention, R gaze preference, L hemiplegia, no dressing to L foot - wear post-op shoe OOB Required Braces or Orthoses: Other Brace Other Brace: dressing on L foot Restrictions Weight Bearing Restrictions: No     Therapy/Group: Individual Therapy  [name] 02/01/2022, 7:52 AM

## 2022-02-01 NOTE — Discharge Summary (Signed)
Physician Discharge Summary  Patient ID: Lauren [name] MRN: [id number] DOB/AGE: October 31, 1980 41 y.o.  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 EXAM: LEFT FOOT - 2 VIEW COMPARISON:  Left foot radiographs 12/14/2021 FINDINGS: Redemonstration of amputation of the fifth ray to the level of the fifth metatarsal mid shaft. There is a new apparent acute to subacute comminuted fracture of the distal shaft of the fourth metatarsal with up to approximately 5 mm medial displacement of the distal fracture component with respect to the proximal fracture component and mild medial apex angulation at the fracture site. A thin longitudinal 15 x 2 mm lateral cortical fracture fragment remains only minimally displaced at the lateral aspect of the fracture. Mild vascular calcifications. IMPRESSION: 1. 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 This is not significantly changed. 2. New acute to subacute, comminuted, mildly displaced, and mildly angulated fracture of the distal shaft of the fourth metatarsal. Electronically Signed   By: Yvonne Kendall M.D.   On: 01/30/2022 17:32   Labs:  Basic Metabolic Panel:    Latest  Ref Rng & Units 02/17/2022    5:51 AM 02/15/2022    5:51 AM 02/08/2022    5:52 AM  BMP  Glucose 70 - 99 mg/dL 88  395  147   BUN 6 - 20 mg/dL 38  39  30   Creatinine 0.44 - 1.00 mg/dL 2.53  2.47  2.16   Sodium 135 - 145 mmol/L 139  133  137   Potassium 3.5 - 5.1 mmol/L 3.7  4.4  3.6   Chloride 98 - 111 mmol/L 103  99  104   CO2 22 - 32 mmol/L 25  23  25    Calcium 8.9 - 10.3 mg/dL 9.0  8.6  8.8      CBC:    Latest Ref Rng & Units 02/15/2022    5:51 AM 02/08/2022    5:52 AM 02/06/2022    6:26 AM  CBC  WBC 4.0 - 10.5 K/uL 9.2  7.1  7.6   Hemoglobin 12.0 - 15.0 g/dL 9.3  8.8  8.5   Hematocrit 36.0 - 46.0 % 28.9  28.1  26.5   Platelets 150 - 400 K/uL 435  408  425      CBG: Recent Labs  Lab 02/18/22 0656 02/18/22 1235 02/18/22 1707 02/18/22 2113 02/19/22 0613  GLUCAP 134* 195* 182* 203* 224*    Brief HPI:   Lauren [name] is a 41 y.o. female noted to the emergency room on 05/10/2021 with unsteady gait, left-sided facial droop and left arm weakness with slurred speech.  Code stroke called.  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  She underwent thrombectomy with TICI 3.  Cardiology consulted and recent echo revealed decrease in EF to approximately 30 to 35%.  She was noted to have elevated troponin with unremarkable EKG and was treated with nitroglycerin and heparin infusion.  ID was consulted for input due to history of left foot osteomyelitis as well as podiatry.  She underwent bone biopsy of fourth metatarsal left foot and broad-spectrum antibiotics were adjusted.  She did receive 1 unit PRBCs due to drop in H&H.  Hypercoagulable work-up was negative and she was started on Lovenox per hematology input.  Antibiotics were discontinued as cultures were negative.  Diet was advanced to dysphagia 3, leukocytosis was resolving with improvement in renal status.  PT/OT /ST was working with patient who continued to be limited by  weakness as well as cognitive deficits.  CIR was recommended due to functional decline.   Hospital Course: Lauren [name] was admitted to rehab 01/28/2022 for inpatient therapies to consist of PT, ST and OT at least three hours five days a week. Past admission physiatrist, therapy team and rehab RN have worked together to provide customized collaborative inpatient rehab. Diet advanced to regular textures. Seen by podiatry 9/30 and left films obtained. Some dehiscence of incision. New wound care orders placed.  [name] saw the patient on 10/3 and allowed the patient to wear post-op shoe or CAM boot to WBAT.   Rapid response called at 1147 on 10/4 due to chest pain. NTG given and EKG times 2 performed. No evidence of STE. Chest xray c/w volume overload and mild pulmonary edema. Cardiology resident consulted. Lasix 40 mg PO given. Creatinine bump to 2.26. Symptoms improved. BP overnight 110s-120s.  Lasix 10 mg PO daily started on 10/5. Also, hemoglobin drift to 7.0 although on recheck was 7.8. Transfused one unit PRBCs. Check FOBT and restart home Protonix. Creatinine slightly lower at 2.12. Fecal occult stool testing negative times 3 samples and follow up CBC showed H/H to be stable on DAPT as well as Lovenox for coagulopathy.  Dose has been  adjusted due to CKD.  She has had issues with left shoulder pain and sling was ordered for use as needed.  Tramadol has also been used on as needed basis for pain management.  Sudden onset of chest pain again on 10/9. NTG times 3 doses given without relief. Rapid response called. Stat EKG, troponin and CK ordered. Chest x-ray ok and trop below her baseline. Trazodone increased to 75 mg at bedtime for sleep. Increased to 100 mg on 10/12. Wanted to try something else for sleep so started on tizanidine 4 mg q HS.  Hemoglobin trending upward and 9.3 on 10/16. Lasix decreased to 20 mg daily on 10/16 due to elevation in serum creatinine. Consulted medicine service on 10/18  for assistance with kidney function, fluid balance and glucose control.  Strict I's and O's were recommended as patient did not appear to to be fluid overloaded.    Blood pressures were monitored on TID basis and she was hypotensive on 10/2 without complaints. Midday hydralazine held. Cardiology consulted due to low EF and multiple meds. At admission, she is receiving hydralazine 12.5 mg TID, isosorbide 20 mg BID, Lopressor 37.5 mg BID but was complicated by multiple episodes of orthostatic hypotension with presyncope and cards was consulted multiple times to help with adjustment of medications. Compression with ACE wrap LE and TED hose RLE when OOB also used for BP support. [name] felt that there was nothing further to add for patient's symptoms and recommended home with  hospice. As BP stabilized, Lasix 40 mg was started for vascular congestion seen on CXR 10/5. Lopressor increase back to 37.5 mg BID.  Diabetes has been monitored with ac/hs CBG checks and SSI was use prn for tighter BS control. Levemir 25 units continued and Novolog 2 units TID with meals. Diabetic coordinator consulted for assistance with diet. Increased meal coverage to 5 units and added nighttime SSI.  Multiple changes in insulin management were made throughout her stay.  Her blood sugars continue to be poorly controlled as patient was not willing to comply with dietary restrictions during her stay.  Internal medicine recommended that patient's insulin pump be resumed as patient reported that this worked better for her.   Diabetic coordinator was consulted who reviewed endocrinology notes and pump was resumed per home regimen.  Both patient and family have been instructed on importance of diabetic management as well as dietary restrictions to provide fluid overload as well as better blood sugar control.  They are by her of need to contact endocrinology for further adjustment in her insulin protocol.  Patient has made progress during his  stay but continues to be limited by left inattention, decrease in attention, problems with recall as well as overall safety awareness.  She will continue to receive further follow-up home health PT, OT, OT and RN by [facility] home health after discharge.  .   Rehab course: During patient's stay in rehab weekly team conferences were held to monitor patient's progress, set goals and discuss barriers to discharge. At admission, patient required X assist with basic self-care skills, moderate to total assist for mobility.  She has had improvement in activity tolerance, balance, postural control as well as ability to compensate for deficits. She has had improvement in functional use LUE  and LLE as well as improvement in awareness.  She is able to complete ADL tasks with min assist.  She requires supervision for transfers and min assist to ambulate 30'with RW and left hand splint.  She requires min to mod assist with verbal and visual cues to complete functional and familiar tasks safely.  She is able to utilize compensatory swallow strategies at modified independent level.  Patient's safety is also intermittently impacted by mobility.  Family has been instructed on 24 hours supervision for safety. Family education has been completed regarding all aspects of care.   Disposition: Home   Diet: Heart Healthy/Carb modified.  Special Instructions: Recommend weekly BMET after discharge. Resume Dexcom at home and monitor blood sugars before meals and at bedtime.  To check weights daily and follow-up with cardiology for any signs of overload. 4.  Will need hypercoagulable labs repeated in 2-3 months as well as follow up with Dr. Chryl Heck 5. No driving or strenuous activity till cleared by MD.   Discharge Instructions     Ambulatory referral to Neurology   Complete by: As directed    An appointment is requested in approximately: 2-3 weeks   Ambulatory referral to Physical Medicine Rehab   Complete by: As  directed    Moderate complexity follow-up 1 to 2 weeks right MCA infarction      Allergies as of 02/19/2022       Reactions   Wellbutrin [bupropion] Hives   Cefepime Rash   Tolerates penicilllin   Ciprofloxacin Hcl Hives, Rash   Hives/rash at injection site; 01/15/22 tolerated IV cipro   Tape Rash        Medication List     STOP taking these medications  arformoterol 15 MCG/2ML Nebu Commonly known as: BROVANA   budesonide 0.5 MG/2ML nebulizer solution Commonly known as: PULMICORT   Dexcom G6 Transmitter Misc   Eliquis 5 MG Tabs tablet Generic drug: apixaban   enoxaparin 80 MG/0.8ML injection Commonly known as: LOVENOX Replaced by: enoxaparin 120 MG/0.8ML injection   hydrALAZINE 25 MG tablet Commonly known as: APRESOLINE   insulin aspart 100 UNIT/ML injection Commonly known as: novoLOG   insulin detemir 100 UNIT/ML injection Commonly known as: LEVEMIR   Levothyroxine Sodium 200 MCG Caps Replaced by: levothyroxine 125 MCG tablet   Omnipod 5 G6 Intro (Gen 5) Kit   Omnipod 5 G6 Pod (Gen 5) Misc   revefenacin 175 MCG/3ML nebulizer solution Commonly known as: YUPELRI       TAKE these medications    acetaminophen 325 MG tablet Commonly known as: TYLENOL Take 1-2 tablets (325-650 mg total) by mouth every 4 (four) hours as needed for mild pain.   atorvastatin 40 MG tablet Commonly known as: LIPITOR Take 1 tablet (40 mg total) by mouth daily.   Dexcom G6 Sensor Misc Change sensor every 10 days as directed   enoxaparin 120 MG/0.8ML injection Commonly known as: LOVENOX Inject 0.8 mLs (120 mg total) into the skin daily. Replaces: enoxaparin 80 MG/0.8ML injection   isosorbide mononitrate 20 MG tablet Commonly known as: ISMO Take 1 tablet (20 mg total) by mouth 2 (two) times daily.   levothyroxine 125 MCG tablet Commonly known as: SYNTHROID Take 1 tablet (125 mcg total) by mouth daily at 6 (six) AM. Replaces: Levothyroxine Sodium 200 MCG Caps    magnesium oxide 400 MG tablet Commonly known as: MAG-OX Take 0.5 tablets (200 mg total) by mouth at bedtime.   methocarbamol 500 MG tablet Commonly known as: ROBAXIN Take 1 tablet (500 mg total) by mouth every 6 (six) hours as needed for muscle spasms.   metoprolol tartrate 25 MG tablet Commonly known as: LOPRESSOR Take 1.5 tablets (37.5 mg total) by mouth 2 (two) times daily. What changed: medication strength   multivitamin with minerals Tabs tablet Take 1 tablet by mouth daily.   nicotine 21 mg/24hr patch Commonly known as: NICODERM CQ - dosed in mg/24 hours Place 1 patch (21 mg total) onto the skin daily.   nitroGLYCERIN 0.4 MG SL tablet Commonly known as: NITROSTAT Place 1 tablet (0.4 mg total) under the tongue every 5 (five) minutes x 3 doses as needed for chest pain.   pantoprazole 40 MG tablet Commonly known as: PROTONIX Take 1 tablet (40 mg total) by mouth daily. What changed:  when to take this reasons to take this   ramelteon 8 MG tablet Commonly known as: ROZEREM Take 1 tablet (8 mg total) by mouth at bedtime.   ranolazine 500 MG 12 hr tablet Commonly known as: RANEXA Take 1 tablet (500 mg total) by mouth 2 (two) times daily.   ticagrelor 90 MG Tabs tablet Commonly known as: BRILINTA Take 1 tablet (90 mg total) by mouth 2 (two) times daily.   tiZANidine 4 MG tablet Commonly known as: ZANAFLEX Take 1 tablet (4 mg total) by mouth at bedtime.   traMADol 50 MG tablet Commonly known as: ULTRAM Take 1 tablet (50 mg total) by mouth every 6 (six) hours as needed for severe pain.   traZODone 50 MG tablet--Rx# 28 pills. Commonly known as: DESYREL Take 1 tablet (50 mg total) by mouth at bedtime.        Follow-up Information     Lemmie Evens, MD  Follow up.   Specialty: Family Medicine Why: Call office in 1-2 days to make arrangements for hospital follow-up. Contact information: [place] [address] [phone]          Courtney Heys, MD Follow up.   Specialty: Physical Medicine and Rehabilitation Why: office will call you with follow up appointment Contact information: [address] [place] [address] [phone]         [facility] Follow up.   Why: Office will call you with follow up appointment Contact information: [address]     [address] [place] [address] [phone]        Brita Romp, NP Follow up.   Specialty: Nurse Practitioner Why: Call in 1-2 days for post hospital follow up Contact information: [address] [address] [phone]         Edrick Kins, DPM Follow up.   Specialty: Podiatry Why: Call in 1-2 days for post hospital follow up Contact information: [address] [place] [address] [phone]         Erma Heritage, PA-C Follow up on 03/05/2022.   Specialties: Librarian, academic, Cardiology Why: Appointment at 2:20 pm Contact information: [place] [address] [phone]                 Signed: Bary Leriche 02/19/2022, 9:43 AM

## 2022-02-01 NOTE — Progress Notes (Signed)
ANTICOAGULATION CONSULT NOTE - Initial Consult  Pharmacy Consult for lovenox Indication:  hypercoaguable state (failed Eliquis)  Allergies  Allergen Reactions   Wellbutrin [Bupropion] Hives   Cefepime Rash    Tolerates penicilllin   Ciprofloxacin Hcl Hives and Rash    Hives/rash at injection site; 01/15/22 tolerated IV cipro   Tape Rash    Vital Signs: Temp: 98.4 F (36.9 C) (10/02 0358) BP: 124/71 (10/02 0358) Pulse Rate: 98 (10/02 0358)  Labs: Recent Labs    01/31/22 2103 02/01/22 0611  HGB  --  7.3*  HCT  --  23.1*  PLT  --  535*  HEPRLOWMOCWT 0.93  --   CREATININE  --  1.59*     Estimated Creatinine Clearance: 51.1 mL/min (A) (by C-G formula based on SCr of 1.59 mg/dL (H)).   Medical History: Past Medical History:  Diagnosis Date   Anemia    CAD (coronary artery disease)    a. s/p cath in 03/2014 showing 30% mid-LAD, moderate to severe disease along small D1, patent LCx, moderate to severe distal OM2 stenosis and moderate diffuse diease along RCA not amenable to PCI   CHF (congestive heart failure) ([place])    a. EF 55-60% in 12/2019 b. EF at 35-40% by echo in 05/2020   CKD (chronic kidney disease) stage 4, GFR 15-29 ml/min (HCC)    Diabetes mellitus without complication (HCC)    Myocardial infarction ([place])    Neuropathy    Stroke (HCC)     Medications:  Medications Prior to Admission  Medication Sig Dispense Refill Last Dose   apixaban (ELIQUIS) 5 MG TABS tablet Take 5 mg by mouth 2 (two) times daily.   01/07/2022 at unk   enoxaparin (LOVENOX) 80 MG/0.8ML injection Inject 0.8 mLs (80 mg total) into the skin daily. 0 mL 0 unk   arformoterol (BROVANA) 15 MCG/2ML NEBU Take 2 mLs (15 mcg total) by nebulization 2 (two) times daily. 120 mL 0 unk   atorvastatin (LIPITOR) 40 MG tablet TAKE 1 TABLET BY MOUTH ONCE DAILY. (Patient taking differently: Take 40 mg by mouth daily.) 90 tablet 1 01/07/2022   budesonide (PULMICORT) 0.5 MG/2ML nebulizer solution Take 2 mLs (0.5  mg total) by nebulization 2 (two) times daily.  12 unk   Continuous Blood Gluc Sensor (DEXCOM G6 SENSOR) MISC Change sensor every 10 days as directed 9 each 3    Continuous Blood Gluc Transmit (DEXCOM G6 TRANSMITTER) MISC Change transmitter every 90 days as directed. 1 each 3    hydrALAZINE (APRESOLINE) 25 MG tablet Take 0.5 tablets (12.5 mg total) by mouth every 8 (eight) hours. 45 tablet 0 01/07/2022   insulin aspart (NOVOLOG) 100 UNIT/ML injection Inject 0-20 Units into the skin every 4 (four) hours. 10 mL 11 unk   insulin detemir (LEVEMIR) 100 UNIT/ML injection Inject 0.25 mLs (25 Units total) into the skin 2 (two) times daily. 10 mL 11 unk   Insulin Disposable Pump (OMNIPOD 5 G6 INTRO, GEN 5,) KIT Change pod every 48-72 hrs 1 kit 0    Insulin Disposable Pump (OMNIPOD 5 G6 POD, GEN 5,) MISC Change pod every 48-72 hours 25 each 6    isosorbide mononitrate (ISMO) 20 MG tablet Take 1 tablet (20 mg total) by mouth 2 (two) times daily. 60 tablet 0 unk   Levothyroxine Sodium 200 MCG CAPS Take 200 mcg by mouth daily before breakfast.   01/07/2022   Metoprolol Tartrate 37.5 MG TABS Take 37.5 mg by mouth 2 (two) times daily.  60 tablet 0 01/07/2022   nitroGLYCERIN (NITROSTAT) 0.4 MG SL tablet Place 1 tablet (0.4 mg total) under the tongue every 5 (five) minutes x 3 doses as needed for chest pain. 25 tablet 3 never used   pantoprazole (PROTONIX) 40 MG tablet Take 1 tablet (40 mg total) by mouth daily as needed for acid reflux (Patient taking differently: Take 40 mg by mouth daily as needed (heartburn).) 90 tablet 2 01/07/2022   revefenacin (YUPELRI) 175 MCG/3ML nebulizer solution Take 3 mLs (175 mcg total) by nebulization daily. 90 mL  unk   ticagrelor (BRILINTA) 90 MG TABS tablet Take 1 tablet (90 mg total) by mouth 2 (two) times daily. 60 tablet 0 unk    Assessment: 68 YOF has a work-up for a hypercoaguable state. Differential includes APLS, vasculitis, or other autoimmune diseases that can contribute to a  hypercoaguable state. Patient was maintained on lovenox which was transitioned to a heparin infusion prior to transferring to CIR for rehab services. A consult is received to convert her to therapeutic lovenox per hematology recommendations.   Anti-Xa level drawn at 3.25 hours was 0.93. goal for this patient is 1-2. Scr has improved to 1.59. Will increase to full ~ 1.83m/kg q24h.   Goal of Therapy:  Monitor platelets by anticoagulation protocol: Yes Anti Xa level of 1-2 at 4 hour post dose  Plan:  Increase Lovenox to 120 mg [place] qday.  Monitor for signs of bleeding LMWH level at steady state  Nicklas Mcsweeney A. PLevada Dy PharmD, BCPS, FNKF Clinical Pharmacist  Please utilize Amion for appropriate phone number to reach the unit pharmacist ([place]   Contact: [phone] 3 PM  "Be curious, not judgmental..." -WJamal Maes

## 2022-02-01 NOTE — Discharge Instructions (Addendum)
Inpatient Rehab Discharge Instructions  Lauren [name] Discharge date and time:  02/19/22  Activities/Precautions/ Functional Status: Activity: no lifting, driving, or strenuous exercise until cleared by MD Diet: diabetic diet Wound Care: keep wound clean and dry   Functional status:  ___ No restrictions     ___ Walk up steps independently _X__ 24/7 supervision/assistance   ___ Walk up steps with assistance ___ Intermittent supervision/assistance  ___ Bathe/dress independently ___ Walk with walker     ___ Bathe/dress with assistance ___ Walk Independently    ___ Shower independently _X__ Walk with assistance    _X__ Shower with assistance ___ No alcohol     ___ Return to work/school ________   Special Instructions: Monitor blood sugars before meals and at bedtime. As well as anytime it does not feel right--contact endocrinology for blood sugars 70 or less or greater than 300. 2.  Need to drink water --avoid all salt and sodas.  3. Wear CAM boot on left foot and follow up with podiatry for any worsening or new changes.     COMMUNITY REFERRALS UPON DISCHARGE:    Home Health:   PT  OT  SP  RN                [place] Phone:[phone]   Medical Equipment/Items Ordered:wheelchair and 3 in 1 has rolling walker and tub bench from family members                                                 Agency/Supplier:[facility]  [place] Cigarette smoking nearly doubles your risk of having a stroke & is the single most alterable risk factor  If you smoke or have smoked in the last 12 months, you are advised to quit smoking for your health. Most of the excess cardiovascular risk related to smoking disappears within a year of stopping. Ask you doctor about anti-smoking medications [place] Quit Line: 1-800-QUIT NOW Free Smoking Cessation Classes [phone]  CHOLESTEROL Know your levels; limit fat & cholesterol in your diet   Lipid Panel     Component Value Date/Time   CHOL 85 01/09/2022 0503   TRIG 197 (H) 01/16/2022 0403   HDL 15 (L) 01/09/2022 0503   CHOLHDL 5.7 01/09/2022 0503   VLDL 31 01/09/2022 0503   LDLCALC 39 01/09/2022 0503     Many patients benefit from treatment even if their cholesterol is at goal. Goal: Total Cholesterol (CHOL) less than 160 Goal:  Triglycerides (TRIG) less than 150 Goal:  HDL greater than 40 Goal:  LDL (LDLCALC) less than 100   BLOOD PRESSURE American Stroke Association blood pressure target is less that 120/80 mm/Hg  Your discharge blood pressure is:  BP: 124/71 Monitor your blood pressure Limit your salt and alcohol intake Many individuals will require more than one medication for high blood pressure  DIABETES (A1c is a blood sugar average for last 3 months) Goal HGBA1c is under 7% (HBGA1c is blood sugar average for last 3 months)  Diabetes: Diagnosis of diabetes:  Your A1c:8 %    Lab Results  Component Value Date   HGBA1C 8.0 (H) 01/09/2022    Your HGBA1c can be lowered with medications, healthy diet, and exercise. Check your blood sugar as directed by your physician Call your physician if you experience unexplained or low  blood sugars.  PHYSICAL ACTIVITY/REHABILITATION Goal is 30 minutes at least 4 days per week  Activity: Increase activity slowly, and No driving, Therapies: Physical Therapy: Home Health Return to work: to be decided on follow up appts.  Activity decreases your risk of heart attack and stroke and makes your heart stronger.  It helps control your weight and blood pressure; helps you relax and can improve your mood. Participate in a regular exercise program. Talk with your doctor about the best form of exercise for you (dancing, walking, swimming, cycling).  DIET/WEIGHT Goal is to maintain a healthy weight  Your discharge diet is:  Diet Order             Diet heart healthy/carb modified Room service appropriate? Yes; Fluid consistency: Thin   Diet effective now                  thin liquids Your height is:    Your current weight is: Weight: 197 lbs  Your Body Mass Index (BMI) is:  BMI (Calculated): 31.15 Following the type of diet specifically designed for you will help prevent another stroke. Your goal weight is:  150 lbs Your goal Body Mass Index (BMI) is 19-24. Healthy food habits can help reduce 3 risk factors for stroke:  High cholesterol, hypertension, and excess weight.  RESOURCES Stroke/Support Group:  Call [phone]   STROKE EDUCATION PROVIDED/REVIEWED AND GIVEN TO PATIENT Stroke warning signs and symptoms How to activate emergency medical system (call 911). Medications prescribed at discharge. Need for follow-up after discharge. Personal risk factors for stroke. Pneumonia vaccine given: No Flu vaccine given: No My questions have been answered, the writing is legible, and I understand these instructions.  I will adhere to these goals & educational materials that have been provided to me after my discharge from the hospital.     Heart Failure  SPECIAL INSTRUCTIONS AVOID STRAINING STOP ANY ACTIVITY THAT CAUSES CHEST PAIN, SHORTNESS OF BREATH, DIZZINESS, SWEATING, OR EXCESSIVE WEAKNESS.  SPECIAL INSTRUCTIONS FOR PATIENTS WITH HEART FAILURE: Continue to follow the instructions in your Heart Failure Patient Education information that you received during your hospital stay. Record daily weight on same scale at same time of day. If your doctor did not discuss your diet or activity in the information above, please follow a Heart Healthy Low Sodium diet and increase your activity as you feel able. Call your doctor: (Anytime you feel any of the following symptoms) 3-4 pound weight gain in 1-2 days or 2 pounds overnight Shortness of breath, with or without a dry hacking cough Swelling in the hands, feet or stomach If you have to sleep on extra pillows at night in order to breathe    My questions have been  answered and I understand these instructions. I will adhere to these goals and the provided educational materials after my discharge from the hospital.  Patient/Caregiver Signature _______________________________ Date __________  Clinician Signature _______________________________________ Date __________  Please bring this form and your medication list with you to all your follow-up doctor's appointments.

## 2022-02-01 NOTE — Progress Notes (Signed)
   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  She was initially admitted under PCCM service, extubated on 01/18/2022 and transferred to [facility] on 01/21/2022.  Palliative care was asked to get involved to further address goals of care in the setting of a large stroke.   Today's Discussion 02/01/2022  *Please note that this is a verbal dictation therefore any spelling or grammatical errors are due to the "[place] [employer]" system interpretation.  Chart reviewed inclusive of vital signs, progress notes, laboratory results, and diagnostic images.  Patient assessed at the bedside.  She is eating her lunch and denies any pain or distress.  No family present during my visit.  We discussed patient's progress since our last discussion and my colleague's visits over the weekend.  Annamaria feels like things are going well and she has no concerns or questions about her care plan.  She reports that her family also feels well supported and are satisfied with the care she is receiving.  Informed her that PMT remains available and encouraged her to call with any needs should they arise.  Questions and concerns addressed. Palliative Support Provided.   Objective Assessment: Vital Signs Vitals:   02/01/22 0358 02/01/22 0821  BP: 124/71   Pulse: 98   Resp: 18   Temp: 98.4 F (36.9 C)   SpO2: 100% 97%    Intake/Output Summary (Last 24 hours) at 02/01/2022 1243 Last data filed at 02/01/2022 5701 Gross per 24 hour  Intake 598 ml  Output 300 ml  Net 298 ml    Last Weight  Most recent update: 02/01/2022  5:18 AM    Weight  84.9 kg (187 lb 2.7 oz)            Gen:  Adult Caucasian F in  NAD CV: Regular rate and rhythm  PULM: ON RA breathing is even and nonlabored ABD: soft/nontender  EXT: No edema Neuro: Alert and oriented x3 Psych: normal mood and affect   SUMMARY OF RECOMMENDATIONS   -Continue DNAR/DNI -Continue current care -Goals remain clear, hopeful for functional improvement -PMT will continue to support incrementally as needed.  Please call team line or secure chat should urgent needs arise    I spent 35 minutes in the care of the patient today in the above activities and documenting the encounter. ______________________________________________________________________________________ Dorthy Cooler, [place] Team Team Cell Phone: [phone] Please utilize secure chat with additional questions, if there is no response within 30 minutes please call the above phone number  Palliative Medicine Team providers are available by phone from 7am to 7pm daily and can be reached through the team cell phone.  Should this patient require assistance outside of these hours, please call the patient's attending physician.

## 2022-02-01 NOTE — Progress Notes (Signed)
PROGRESS NOTE   Subjective/Complaints:  Pt sleepy this AM Talked to me for a few minutes- but then went back to sleep.   No more Sx's of RSV- no resp Sx's.  Insisting to eat food from home-won't eat carb modified diet- and due to her DM being not real well controlled, don't feel comfortable liberalizing to regular diet.   Doesn't have CAM walker at home- per podiatry can WBAT if gets it.     ROS:  ROS: Denies fevers, chills, N/V, abdominal pain, constipation, diarrhea, SOB, cough, chest pain, new weakness or paraesthesias.     Objective:   DG Foot 2 Views Left  Result Date: 01/30/2022 CLINICAL DATA:  Postoperative state. EXAM: LEFT FOOT - 2 VIEW COMPARISON:  Left foot radiographs 12/14/2021 FINDINGS: Redemonstration of amputation of the fifth ray to the level of the fifth metatarsal mid shaft. There is a new apparent acute to subacute comminuted fracture of the distal shaft of the fourth metatarsal with up to approximately 5 mm medial displacement of the distal fracture component with respect to the proximal fracture component and mild medial apex angulation at the fracture site. A thin longitudinal 15 x 2 mm lateral cortical fracture fragment remains only minimally displaced at the lateral aspect of the fracture. Mild vascular calcifications. IMPRESSION: 1. 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 This is not significantly changed. 2. New acute to subacute, comminuted, mildly displaced, and mildly angulated fracture of the distal shaft of the fourth metatarsal. Electronically Signed   By: Yvonne Kendall M.D.   On: 01/30/2022 17:32   Recent Labs    02/01/22 0611  WBC 6.9  HGB 7.3*  HCT 23.1*  PLT 535*   Recent Labs    02/01/22 0611  NA 138  K 3.6  CL 111  CO2 21*  GLUCOSE 185*  BUN 24*  CREATININE 1.59*  CALCIUM 8.2*    Intake/Output Summary (Last 24 hours) at 02/01/2022  0951 Last data filed at 02/01/2022 0529 Gross per 24 hour  Intake 598 ml  Output 300 ml  Net 298 ml        Physical Exam: Vital Signs Blood pressure 124/71, pulse 98, temperature 98.4 F (36.9 C), resp. rate 18, weight 84.9 kg, last menstrual period 11/01/2021, SpO2 97 %.    General: awake, but sleepy-  NAD HENT: conjugate gaze; oropharynx moist CV: regular rate; no JVD Pulmonary: CTA B/L; no W/R/R- good air movement GI: soft, NT, ND, (+)BS Psychiatric: appropriate- sleepy; irritable Neurological: alert, but sleepy- doesn't want to be woken up "much".  Skin: + L 5th toe amputation; wound site open with mild serous drainage, clean wound bed, no erythema or warmth.  Seen wrapped up in ACE wrap- C/D/I   PE from prior encounter: MSK:      No apparent deformity.      Strength:                RUE: 5/5 SA, 5/5 EF, 5/5 EE, 5/5 WE, 5/5 FF, 5/5 FA                 LUE: flaccid, 0/5 throughout  RLE: 5/5 HF, 5/5 KE, 5/5 DF, 5/5 EHL, 5/5 PF                 LLE:  3/5 HF, 2/5 KE, 0/5 DF, 0/5 EHL, 1/5 PF    Neurologic exam:  Cognition: AAO to person, place, time and event.  Language: Fluent, No substitutions or neoglisms.Moderate dysarthria Mood: Pleasant affect, appropriate mood.  Sensation: To light touch intact in BL UEs and LEs  CN: Left facial droop   Assessment/Plan: 1. Functional deficits which require 3+ hours per day of interdisciplinary therapy in a comprehensive inpatient rehab setting. Physiatrist is providing close team supervision and 24 hour management of active medical problems listed below. Physiatrist and rehab team continue to assess barriers to discharge/monitor patient progress toward functional and medical goals  Care Tool:  Bathing    Body parts bathed by patient: Right arm, Left arm, Chest, Abdomen, Buttocks, Right upper leg, Left upper leg, Face   Body parts bathed by helper: Front perineal area, Right lower leg, Left lower leg     Bathing  assist Assist Level: Moderate Assistance - Patient 50 - 74%     Upper Body Dressing/Undressing Upper body dressing   What is the patient wearing?: Bra, Pull over shirt    Upper body assist Assist Level: Maximal Assistance - Patient 25 - 49%    Lower Body Dressing/Undressing Lower body dressing      What is the patient wearing?: Incontinence brief, Pants     Lower body assist Assist for lower body dressing: Maximal Assistance - Patient 25 - 49%     Toileting Toileting    Toileting assist Assist for toileting: 2 Helpers     Transfers Chair/bed transfer  Transfers assist     Chair/bed transfer assist level: Moderate Assistance - Patient 50 - 74%     Locomotion Ambulation   Ambulation assist   Ambulation activity did not occur: Safety/medical concerns          Walk 10 feet activity   Assist  Walk 10 feet activity did not occur: Safety/medical concerns        Walk 50 feet activity   Assist Walk 50 feet with 2 turns activity did not occur: Safety/medical concerns         Walk 150 feet activity   Assist Walk 150 feet activity did not occur: Safety/medical concerns         Walk 10 feet on uneven surface  activity   Assist Walk 10 feet on uneven surfaces activity did not occur: Safety/medical concerns         Wheelchair     Assist Is the patient using a wheelchair?: Yes Type of Wheelchair: Manual    Wheelchair assist level: Total Assistance - Patient < 25% Max wheelchair distance: 300 ft    Wheelchair 50 feet with 2 turns activity    Assist        Assist Level: Total Assistance - Patient < 25%   Wheelchair 150 feet activity     Assist      Assist Level: Total Assistance - Patient < 25%   Blood pressure 124/71, pulse 98, temperature 98.4 F (36.9 C), resp. rate 18, weight 84.9 kg, last menstrual period 11/01/2021, SpO2 97 %.  Medical Problem List and Plan: 1. Functional deficits secondary to right MCA  infarct with left lower extremity paresis and left upper extremity plegia status post thrombectomy, right ICA dissection with stent placement.             -  patient may shower with lines and foot wound covered             -ELOS/Goals: 14-21 days  Con't CIR- PT and OT  We will check into CAM boot asap.  2.  Antithrombotics: -DVT/anticoagulation:  Pharmaceutical: Lovenox             -antiplatelet therapy: Brilinta 3. Pain Management: Tylenol, oxycodone as needed 4. Mood/Behavior/Sleep: LCSW to evaluate and provide support             -continue melatonin 3 mg q HS             -antipsychotic agents: n/a 5. Neuropsych/cognition: This patient is capable of making decisions on her own behalf. 6. 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 Fluids/Electrolytes/Nutrition: Strict Is and Os and follow-up chemistries             -dys 3/thin liquids; SLP eval             -repeat mag/phos tomorrow 8: Right ICA dissection with stent placement: on Brilinta 9: Hypertension: monitor             -continue hydralazine 12.5 mg TID             -continue isosorbide 20 mg BID             -continue Lopressor 37.5 mg BID  10/2- BP is controlled- con't regimen 10: Hyperlipidemia: continue Lipitor 40 mg daily 11: Coagulopathy s/p RUE embolectomy 10/2021: Eliquis PTA now on Lovenox; f                -follow-up hematology as outpt 12: COPD exacerbation: hypoxia resolved; continue bronchodilators/nebs 13: Chronic diastolic and systolic heart failure:             -strict Is and Os and daily weight             -no diuretics currently             -see meds above   Filed Weights   01/30/22 0402 01/31/22 0500 02/01/22 0518  Weight: 82.8 kg 83.7 kg 84.9 kg  - daily weight unreliable  14: Hypothyroidism: continue Synthroid 125 mcg daily; follow-up TSH 15: Acute on chonic anemia: s/p 1 un PCs; follow-up CBC  -9/29 HGB stable at 8.2  10/2- Hb down to 7.3- will  recheck in AM and make sure improving/stable 16: DM: CBGs q AC and q HS (A1c = 8%)             -9/29 Continue levemir 25u BID  -Add 2u novolog TID with meals             -continue SSI q AC  10/2- pt having variable BG's eating mainly food from home.   Latest Reference Range & Units 01/30/22 11:37 01/30/22 16:36 01/30/22 21:08  Glucose-Capillary 70 - 99 mg/dL 114 (H) 223 (H) 163 (H)  (H): Data is abnormally high  17: Class 2 obesity: dietary counseling 18: Tobacco abuse: cessation counseling; continue nicotine patch 19: AKI atop CKD IV: trend BMP  -9/29 Cr improved to 1.88  10/2- Down to 1.59 and BUN 24- will push fluids and recheck in AM 20: CAD: stable; treated medically; Dr. Harl Bowie 21: Left foot osteomyelitis s/p ray amputation [name] -recent bone biopsy; cultures neg - Husband to bring in ?CAM boot for weightbearing, protection - Remove IJ order - 9/30  Noted surgical site dehiscence with PT ambulation, was not wearing orthotic. Podiatry consulted, imaging showing mildly angulated fracture of the distal fourth metatarsal (likely iatrogenic from prior amputation)               - Orders for betadyne dressing daily               - NWB LLE               - Appreciate podiatry recs, to discuss 4th MT fx as group next week  10/2- Podiatry says once has CAM boot to be worn- either from home or for [name] to order- d/w PA-  and will check in to it. Supposedly had a dehiscence but podiatry to see again today.  22: Leukocytosis: trending down; follow-up CBC  -9/29 WBC 9/5 today, improved 23. Dysphagia  -Advanced to heart healthy/carb modified/ thin liquids            - 9/30 - family requesting diet liberalization as patient primarily eating foods brought in from outside d/t hospital food bland; carb modified diet unable to be ordered with less restrictive parameters. Dietary consulted.   24. RSV  10/2- will take off Droplet precautions- no more Sx's.   I spent a total of 33   minutes on  total care today- >50% coordination of care- due to d/w PA about Hb as well as CAM boot- also with nursing- will take off Droplet precautions for RSV since no more Sx's.    LOS: 4 days A FACE TO FACE EVALUATION WAS PERFORMED  Lorma Heater 02/01/2022, 9:51 AM

## 2022-02-01 NOTE — Progress Notes (Addendum)
Initial Nutrition Assessment  DOCUMENTATION CODES:   Obesity unspecified  INTERVENTION:  -Liberalize diet to carb modified only -Provide diet education when clinically appropriate -If BG better controlled and menu options continue to be a concern, consider regular diet  NUTRITION DIAGNOSIS:  Other (Comment) (undesirable food choices) related to chronic illness (DM, CHF, HTN, CVA) as evidenced by other (comment) (pt requesting >80g CHO/meal).  GOAL:  Patient will meet greater than or equal to 90% of their needs  MONITOR:  PO intake  REASON FOR ASSESSMENT:  Consult Other (Comment) (diet liberalization)  ASSESSMENT:  Pt is a 41yo F with PMH of CAD with MI, CHF with preserved EF, HTN, uncontrolled DM, strokes, R arm ischemia, CKD stage IV, PVD, L foot DM ulcer s/p amputation of 5th toe 08/2021 with concern of osteomyelitis of 4th toe admitted with acute R MCA stroke s/p thrombectomy, R ICA dissection repair, and stent placement who presents for rehab after acute R MCA stroke. RD consulted for possible diet liberalization.   Visited pt at lunch time. She was eating a sandwich and baked chips and asked if she could have more of a carb allowance. Reviewed pt's menus over the last several days and it appears she is not meeting CHO threshold (75-80g/meal). Discussed that the cardiac restriction is likely the diet that is causing limited menu options. Offered to remove this restriction and pt agreeable. Pt unlikely to have excessive Na and fat consumption with hospital meals. Pt may benefit from diet education prior to discharge. Of note, pt with low BG prior to lunch. If meals/menus continue to be a concern and BG is better controlled, consider regular diet.  Menus for today reviewed. Pt is meeting estimated nutrient needs with po diet: 1710kcal and 81g protein.  Medications reviewed and include: lipitor, novolog, levemir, synthroids  Labs reviewed: BG:56-243, BUN:24, Cr:1.59,  GFR:42  NUTRITION - FOCUSED PHYSICAL EXAM: Deferred at this time. Eating lunch and working with SLP.  Diet Order:   Diet Order             Diet Carb Modified Fluid consistency: Thin; Room service appropriate? Yes with Assist  Diet effective now                   EDUCATION NEEDS:   No education needs have been identified at this time  Skin:  Skin Assessment: Reviewed RN Assessment  Last BM:  01/31/22  Height:  Ht Readings from Last 1 Encounters:  01/13/22 5\' 5"  (1.651 m)    Weight:  Wt Readings from Last 1 Encounters:  02/01/22 84.9 kg    Ideal Body Weight:  56.8kg  BMI:  Body mass index is 31.15 kg/m.  Estimated Nutritional Needs:   Kcal:  1700-2100kcal  Protein:  70-85g  Fluid:  1700-2141mL  Candise Bowens, MS, RD, LDN, CNSC See [facility] for contact information

## 2022-02-01 NOTE — Inpatient Diabetes Management (Signed)
Inpatient Diabetes Program Recommendations  AACE/ADA: New Consensus Statement on Inpatient Glycemic Control (2015)  Target Ranges:  Prepandial:   less than 140 mg/dL      Peak postprandial:   less than 180 mg/dL (1-2 hours)      Critically ill patients:  140 - 180 mg/dL   Lab Results  Component Value Date   GLUCAP 207 (H) 02/01/2022   HGBA1C 8.0 (H) 01/09/2022    Latest Reference Range & Units 01/31/22 06:00 01/31/22 11:22 01/31/22 16:24 01/31/22 21:11 02/01/22 06:02  Glucose-Capillary 70 - 99 mg/dL 213 (H) 243 (H) 160 (H) 178 (H) 207 (H)    DM1  (does not make insulin.  Needs correction, basal and meal coverage)   Outpatient Diabetes medications:  Per Whitney with [facility]-last settings on 9/10  Basal rate 12a-12p = 0.9 units/hr--total 24 hr basal 21.6 units Insulin: Carb ratio= 1 unit/10 g  Correction Sensitivity= 38 mg/dL  Target BG= 120  Correction Threshold= 120  Active time= 4 hrs  Max basal rate= 2 units/hr  Extended bolus= off  Reverse correction= On  Max bolus= 20 units    Current orders for Inpatient glycemic control:  Levemir 25 units BID Novolog 0-20 units tid Novolog 2 units tid meal coverage    Inpatient Diabetes Program Recommendations:     -   Increase Novolog meal coverage to Novolog 4 units   Thanks,  Tama Headings RN, MSN, BC-ADM Inpatient Diabetes Coordinator Team Pager [phone] (8a-5p)

## 2022-02-01 NOTE — Progress Notes (Addendum)
Nursing reported low BP readings this afternoon: 89/48 and repeat was 83/51. HR 63. Hydralazine 12.5 mg held. EF estimated at 35%. Patient without syncope, near-syncope, dizziness, chest discomfort or SOB.  Call placed to [facility] for assistance with medications.  Also, patient now wearing CAM boot on left and as per podiatry may bear weight as tolerated in CAM boot. Order placed.

## 2022-02-01 NOTE — Progress Notes (Signed)
Physical Therapy Session Note  Patient Details  Name: Lauren [name] MRN: [id number] Date of Birth: 1980-09-12  Today's Date: 02/01/2022 PT Individual Time: 4098-1191 PT Individual Time Calculation (min): 80 min   Short Term Goals: Week 1:  PT Short Term Goal 1 (Week 1): Pt will perform bed mobility with increased attn to LUE and consistent MinA. PT Short Term Goal 2 (Week 1): pt will perform sit<>stand with CGA and improved midline positioning. PT Short Term Goal 3 (Week 1): Pt will perform stand pivot transfers with consistent minA. PT Short Term Goal 4 (Week 1): Pt will initiate gait training using LRAD. PT Short Term Goal 5 (Week 1): Pt will initiate stair training.  Skilled Therapeutic Interventions/Progress Updates:      Therapy Documentation Precautions:  Precautions Precautions: Fall Precaution Comments: L inattention, R gaze preference, L hemiplegia, no dressing to L foot - wear post-op shoe OOB Required Braces or Orthoses: Other Brace Other Brace: dressing on L foot Restrictions Weight Bearing Restrictions: No   Pt received seated in w/c attempting transfer with spouse to toilet. PT educated pt and family to perform toilet transfers with nursing only. Pt requires max A with stand pivot transfer to toilet, pt continent by voiding. Pt returned to w/c and therapist attempted to retrieve left half lap tray for w/c and out of stock therefore therapist obtained arm trough. PT attempted to set-up saddle hand splint for RW and unable to complete due to out of stock of materials, plan to initiate ace wrapping with of L UE to RW for mobility in next session. On PT return pt with PA. PA ordered CAM boot and pt to remain NWB in L LE post op shoe. Pt reports need to void and mod A with lateral transfer to bed and sit to lying. Pt utilized female urinal with assist of NT. Pt left semi-reclined in bed with all needs in reach and alarm on.    Therapy/Group: Individual Therapy  Verl Dicker Verl Dicker PT, DPT  02/01/2022, 7:52 AM

## 2022-02-01 NOTE — Progress Notes (Addendum)
Rounding Note    Patient Name: Lauren [name] Date of Encounter: 02/01/2022  [place] Cardiologist: Carlyle Dolly, MD   Subjective   She feels fine, states she is doing well with therapy, has been out of bed. She is eating and drinking well, although nursing staff states she is not eating or drinking adequately at times. She denied any chest pain, dizziness, syncope. She was not aware of her low BP.   Inpatient Medications    Scheduled Meds:  atorvastatin  40 mg Per Tube Daily   enoxaparin (LOVENOX) injection  120 mg Subcutaneous Q24H   hydrALAZINE  12.5 mg Oral Q8H   insulin aspart  0-20 Units Subcutaneous TID WC   insulin aspart  2 Units Subcutaneous TID WC   insulin detemir  25 Units Subcutaneous BID   isosorbide mononitrate  20 mg Oral BID   levothyroxine  125 mcg Oral Q0600   melatonin  3 mg Oral QHS   metoprolol tartrate  37.5 mg Oral BID   nicotine  21 mg Transdermal Daily   revefenacin  175 mcg Nebulization Daily   sodium chloride flush  10-40 mL Intracatheter Q12H   ticagrelor  90 mg Oral BID   Or   ticagrelor  90 mg Per Tube BID   Continuous Infusions:  PRN Meds: acetaminophen, alum & mag hydroxide-simeth, diphenhydrAMINE, guaiFENesin-dextromethorphan, influenza vac split quadrivalent PF, ipratropium-albuterol, methocarbamol, oxyCODONE, pneumococcal 20-valent conjugate vaccine, prochlorperazine **OR** prochlorperazine **OR** prochlorperazine, senna-docusate, sodium chloride flush, sodium phosphate, sorbitol, traZODone   Vital Signs    Vitals:   02/01/22 0821 02/01/22 1327 02/01/22 1445 02/01/22 1530  BP:  (!) 89/48 (!) 83/51 97/68  Pulse:  79 95 90  Resp:  14    Temp:  98.4 F (36.9 C)    TempSrc:      SpO2: 97% 100%    Weight:        Intake/Output Summary (Last 24 hours) at 02/01/2022 1640 Last data filed at 02/01/2022 0529 Gross per 24 hour  Intake 598 ml  Output 300 ml  Net 298 ml      02/01/2022    5:18 AM 01/31/2022    5:00  AM 01/30/2022    4:02 AM  Last 3 Weights  Weight (lbs) 187 lb 2.7 oz 184 lb 8.4 oz 182 lb 8.7 oz  Weight (kg) 84.9 kg 83.7 kg 82.8 kg      Telemetry    N/A - Personally Reviewed  ECG    N/A today - Personally Reviewed  Physical Exam   GEN: No acute distress.   Neck: No JVD Cardiac: RRR, no murmurs, rubs, or gallops.  Respiratory: Clear to auscultation bilaterally. On room air.  GI: Soft MS: No leg edema Neuro:  Alert and oriented x3, left sided hemiplegia  Psych: Normal affect   Labs    High Sensitivity Troponin:   Recent Labs  Lab 01/12/22 0203 01/13/22 1059 01/23/22 1500 01/23/22 2250 01/24/22 0050  TROPONINIHS 3,512* 2,880* 127* 132* 133*     Chemistry Recent Labs  Lab 01/27/22 0530 01/28/22 0500 01/29/22 0636 02/01/22 0611  NA 139 134* 137 138  K 3.9 3.9 4.0 3.6  CL 101 101 106 111  CO2 28 25 26  21*  GLUCOSE 134* 211* 312* 185*  BUN 53* 47* 40* 24*  CREATININE 1.87* 1.98* 1.88* 1.59*  CALCIUM 8.8* 8.4* 8.4* 8.2*  MG 2.7* 2.5* 2.4  --   PROT 5.8* 5.9* 5.7*  --   ALBUMIN 2.3* 2.4* 2.4*  --  AST 29 33 32  --   ALT 29 31 32  --   ALKPHOS 98 100 98  --   BILITOT 0.5 0.6 0.3  --   GFRNONAA 35* 32* 34* 42*  ANIONGAP 10 8 5 6     Lipids No results for input(s): "CHOL", "TRIG", "HDL", "LABVLDL", "LDLCALC", "CHOLHDL" in the last 168 hours.  Hematology Recent Labs  Lab 01/28/22 0500 01/29/22 0636 02/01/22 0611  WBC 11.0* 9.5 6.9  RBC 2.94* 2.94* 2.69*  HGB 8.1* 8.2* 7.3*  HCT 25.5* 25.4* 23.1*  MCV 86.7 86.4 85.9  MCH 27.6 27.9 27.1  MCHC 31.8 32.3 31.6  RDW 17.5* 17.1* 16.5*  PLT 665* 635* 535*   Thyroid  Recent Labs  Lab 01/28/22 0500  TSH 41.777*    BNPNo results for input(s): "BNP", "PROBNP" in the last 168 hours.  DDimer No results for input(s): "DDIMER" in the last 168 hours.   Radiology    DG Foot 2 Views Left  Result Date: 01/30/2022 CLINICAL DATA:  Postoperative state. EXAM: LEFT FOOT - 2 VIEW COMPARISON:  Left foot  radiographs 12/14/2021 FINDINGS: Redemonstration of amputation of the fifth ray to the level of the fifth metatarsal mid shaft. There is a new apparent acute to subacute comminuted fracture of the distal shaft of the fourth metatarsal with up to approximately 5 mm medial displacement of the distal fracture component with respect to the proximal fracture component and mild medial apex angulation at the fracture site. A thin longitudinal 15 x 2 mm lateral cortical fracture fragment remains only minimally displaced at the lateral aspect of the fracture. Mild vascular calcifications. IMPRESSION: 1. 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 This is not significantly changed. 2. New acute to subacute, comminuted, mildly displaced, and mildly angulated fracture of the distal shaft of the fourth metatarsal. Electronically Signed   By: Yvonne Kendall M.D.   On: 01/30/2022 17:32    Cardiac Studies   Echo from 01/14/22:   1. Left ventricular ejection fraction, by estimation, is 35 to 40%. Left  ventricular ejection fraction by 2D MOD biplane is 37.1 %. The left  ventricle has moderately decreased function. The left ventricle  demonstrates regional wall motion abnormalities  (see scoring diagram/findings for description). There is mild left  ventricular hypertrophy. Left ventricular diastolic parameters are  consistent with Grade II diastolic dysfunction (pseudonormalization).  Elevated left ventricular end-diastolic pressure.  The E/e' is 6. Wall motion abnormality suggestive of LAD territory  infarct or less likely Takatsubo cardiomyopathy.   2. Right ventricular systolic function is normal. The right ventricular  size is normal. Tricuspid regurgitation signal is inadequate for assessing  PA pressure.   3. The mitral valve is abnormal. Trivial mitral valve regurgitation.   4. The inferior vena cava is normal in size with <50% respiratory  variability,  suggesting right atrial pressure of 8 mmHg.   5. The aortic valve is tricuspid. Aortic valve regurgitation is not  visualized.   Comparison(s): Changes from prior study are noted. 01/10/2022: LVEF 30-35%,  mid to apical ventricular wall thinning and severe hypokinesis to  akinesis.   Patient Profile     41 y.o. 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 She went immediately to IR for thrombectomy.  She suffered dysphagia after the CVA, declined PEG tube placement. Course was complicated by AKI. She also was on Lovenox PTA due to right brachial artery occlusion required thrombectomy 10/2021.  Patient is now discharged to CIR since 01/28/22.  Cardiology was consulted on 01/11/2022 for further evaluation of EKG changes and elevated troponin. Trop peaked 5176. Echo this admission shows LVEF of 30-30% with multiple wall motion abnormalities. She never had any chest pain. Given acute CVA and severe anemia with Hgb down to 6.5, she was recommended medical management of CAD with Brilinta 90mg  twice daily, Toprol-XL 75mg  daily, Imdur 20mg  twice daily and Lipitor 40mg  daily. She suffered AKI on CKD IV, diuretics and GDMT was held for CHF beside Toprol XL and Imdur.   Cardiology signed off 01/27/22. Cardiology is re-consulted today for hypotension.   Assessment & Plan     Hypotension - BP was down to 83/51 this afternoon, overall trend is low normal , asymptomatic    - on hydralazine 12.5mg  q8h, isosorbide mono 20mg  daily, metoprolol 37.5 mg BID currently -  stop hydralazine, if persistently low, may stop isosorbide , trend BP  - encourage PO hydration, unlikely adequate with dysphagia diet   CAD - Patient has a history of CAD with moderate to severe disease on cardiac catheterization in 2015 (treated medically). She presented this admission with an acute stroke and was found to have elevated troponin. Hs troponin peaked at 3,869. Echo this admission shows LVEF of 30-30% with multiple wall motion abnormalities. - Patient had no chest pain this past admission and now  - Trop elevation was likely due to type 2 demand ischemia. No plans for inpatient ischemic workup given acute stroke and severe anemia and advanced CKD. Continue to manage CAD with medical therapy  - Continue Brilinta 90mg  twice daily, metoprolol 37.5 mg BID, isosorbide 20mg  twice daily and Lipitor 40mg  daily. - Patient was receiving lovenox injections earlier this admission for treatment of upper extremity arterial occlusion.  - Palliative care has been involved last admission, patient is DNR. May transition to hospice if oral intake does not improve. Currently in CIR.    Chronic Combined CHF - Initial Echo on 9/10 showed LVEF of 30-35% with akinesis of the mid to apical septal LV segments, all apical segments, and apex as well as significant thinning of the mid to apical septal segments and apex likely representing scarring/infarct. 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  - Euvolemic on exam  - Lasix PRN for CHF exacerbation  - GDMT: Continue metoprolol 37.5 mg BID; isosorbide 20mg  daily twice daily; NO ACEi/ARB/ARNI /MRA or SGLT2Ii given advanced CKD ; stop hydralazine due to hypotension  Hypertension - BP well controlled.  - continue metoprolol tartrate 37.5 mg BID, stop Hydralazine 12.5mg  three times daily and continue Imdur 20mg  twice daily.   Hyperlipidemia - Lipid panel this past admission: Total Cholesterol 85, Triglycerides 154, HDL 15, LDL 39. - on Lipitor  40mg  daily.   CKD stage IV - Baseline creatinine around 2.0 to 2.5. Renal index improving    Right Brachial Artery Occlusion S/p thrombectomy in 10/2021. - On Eliquis prior to admission, transitioned to Therapeutic Lovenox PTA     Acute Stroke  Dysphagia  Type 1 DM  Osteomyelitis of Left  Foot Acute on Chronic Normocytic Anemia - Management per primary team.       Outpatient follow-up has been arranged on 02/19/22 with cardiology    For questions or updates, please contact [place] Please consult [email or web address] for contact info under        Signed, Margie Billet, NP  02/01/2022, 4:40 PM

## 2022-02-02 ENCOUNTER — Telehealth: Payer: Self-pay | Admitting: *Deleted

## 2022-02-02 DIAGNOSIS — I950 Idiopathic hypotension: Secondary | ICD-10-CM

## 2022-02-02 LAB — COMPREHENSIVE METABOLIC PANEL
ALT: 28 U/L (ref 0–44)
AST: 24 U/L (ref 15–41)
Albumin: 2.4 g/dL — ABNORMAL LOW (ref 3.5–5.0)
Alkaline Phosphatase: 89 U/L (ref 38–126)
Anion gap: 5 (ref 5–15)
BUN: 24 mg/dL — ABNORMAL HIGH (ref 6–20)
CO2: 22 mmol/L (ref 22–32)
Calcium: 8.5 mg/dL — ABNORMAL LOW (ref 8.9–10.3)
Chloride: 111 mmol/L (ref 98–111)
Creatinine, Ser: 1.64 mg/dL — ABNORMAL HIGH (ref 0.44–1.00)
GFR, Estimated: 41 mL/min — ABNORMAL LOW (ref >60)
Glucose, Bld: 241 mg/dL — ABNORMAL HIGH (ref 70–99)
Potassium: 4 mmol/L (ref 3.5–5.1)
Sodium: 138 mmol/L (ref 135–145)
Total Bilirubin: 0.6 mg/dL (ref 0.3–1.2)
Total Protein: 5.4 g/dL — ABNORMAL LOW (ref 6.5–8.1)

## 2022-02-02 LAB — CBC WITH DIFFERENTIAL/PLATELET
Abs Immature Granulocytes: 0.03 10*3/uL (ref 0.00–0.07)
Basophils Absolute: 0.1 10*3/uL (ref 0.0–0.1)
Basophils Relative: 1 %
Eosinophils Absolute: 0.3 10*3/uL (ref 0.0–0.5)
Eosinophils Relative: 3 %
HCT: 24.1 % — ABNORMAL LOW (ref 36.0–46.0)
Hemoglobin: 7.7 g/dL — ABNORMAL LOW (ref 12.0–15.0)
Immature Granulocytes: 0 %
Lymphocytes Relative: 26 %
Lymphs Abs: 2.4 10*3/uL (ref 0.7–4.0)
MCH: 27.5 pg (ref 26.0–34.0)
MCHC: 32 g/dL (ref 30.0–36.0)
MCV: 86.1 fL (ref 80.0–100.0)
Monocytes Absolute: 0.5 10*3/uL (ref 0.1–1.0)
Monocytes Relative: 6 %
Neutro Abs: 5.9 10*3/uL (ref 1.7–7.7)
Neutrophils Relative %: 64 %
Platelets: 513 10*3/uL — ABNORMAL HIGH (ref 150–400)
RBC: 2.8 MIL/uL — ABNORMAL LOW (ref 3.87–5.11)
RDW: 16.6 % — ABNORMAL HIGH (ref 11.5–15.5)
WBC: 9.2 10*3/uL (ref 4.0–10.5)
nRBC: 0 % (ref 0.0–0.2)

## 2022-02-02 LAB — GLUCOSE, CAPILLARY
Glucose-Capillary: 224 mg/dL — ABNORMAL HIGH (ref 70–99)
Glucose-Capillary: 229 mg/dL — ABNORMAL HIGH (ref 70–99)
Glucose-Capillary: 255 mg/dL — ABNORMAL HIGH (ref 70–99)
Glucose-Capillary: 265 mg/dL — ABNORMAL HIGH (ref 70–99)
Glucose-Capillary: 274 mg/dL — ABNORMAL HIGH (ref 70–99)

## 2022-02-02 NOTE — Plan of Care (Addendum)
   Patient is a 41 y/o F known to have severe RCA disease not amenable to PCI currently on medical management with ICM LVEF 35% and no device, Hx of CVA, HTN, CKD IV, R brachial artery occlusion s/p thrombectomy in 2023 was admitted to the hospital with acute R MCA stroke s/p  thrombectomy in 01/2022. Hospital course is c/w troponin elevation likely demand from anemia and dysphagia (refused PEG tube). Cardiology is re-consulted for hypotension 83 SBP . Repeat Bps were within normal limits and has normal Bps thereafter. Patient not in room when I went to see her this morning.   #ICM LVEF 35% with no device PLAN - Switch Metoprolol tartarate to Metoprolol succinate 25mg  once daily - Hold ACEi/ARB/ARNi due to CKD IV - Not a candidate for SGLT-2 inhibitors due to DM1. - Decrease Hydralazine dose to 12.5mg  BID and hold for BP<100 mm Hg - Patient is currently DNR and hence not a candidate for ICD therapy.   #CAD (severe RCA disease not amenable to PCI in 2015) PLAN - Continue Brillinta and high intensity statin. - Continue Imdur 20mg  BID - Keep Hb>8.  [facility] will sign off.   Medication Recommendations: Brillinta 90mg  BID, Atorvastatin 40mg  QHS, Metoprolol succinate 25mg  once daily, Imdur 20mg  BID, Hydralazine 12.5mg  BID (hold for BP<100 mm Hg) Other recommendations (labs, testing, etc):  None Follow up as an outpatient:  Cardiology clinic appointment on 02/19/22.  Vishnu Fidel Levy, MD Cardiology

## 2022-02-02 NOTE — Inpatient Diabetes Management (Addendum)
Inpatient Diabetes Program Recommendations  AACE/ADA: New Consensus Statement on Inpatient Glycemic Control (2015)  Target Ranges:  Prepandial:   less than 140 mg/dL      Peak postprandial:   less than 180 mg/dL (1-2 hours)      Critically ill patients:  140 - 180 mg/dL   Lab Results  Component Value Date   GLUCAP 255 (H) 02/02/2022   HGBA1C 8.0 (H) 01/09/2022     Latest Reference Range & Units 02/01/22 06:02 02/01/22 11:27 02/01/22 16:40 02/01/22 20:46 02/02/22 06:40 02/02/22 11:43  Glucose-Capillary 70 - 99 mg/dL 207 (H) 56 (L) 312 (H) 293 (H) 265 (H) 255 (H)   DM1  (does not make insulin.  Needs correction, basal and meal coverage)   Outpatient Diabetes medications:  Per Whitney with [place] [facility]-last settings on 9/10  Basal rate 12a-12p = 0.9 units/hr--total 24 hr basal 21.6 units Insulin: Carb ratio= 1 unit/10 g  Correction Sensitivity= 38 mg/dL  Target BG= 120  Active time= 4 hrs    Current orders for Inpatient glycemic control:  Levemir 25 units BID Novolog 0-20 units tid Novolog 2 units tid meal coverage  Renal function elevated Poor po intake yesterday resulted in hypoglycemia   Pt has Type 1 Diabetes and needs carbohydrate coverage. Pt at times does not eat all of her meal. Family brings in food, however pt does not eat but a portion of food brought in. A pt with type 1 Diabetes covers carbohydrates consumed with insulin, which is different from a pt with type 2 that restricts carbohydrate intake. Pt was on insulin pump rior to admission.  RN: wait to give Novolog meal coverage until it is confirmed what percentage of meal pt consumes.     Inpatient Diabetes Program Recommendations:     -   Increase Novolog meal coverage to Novolog 5 units  -   Decrease Novolog Correction scale to 0-15 units tid  -   Add Novolog hs scale  Thanks,  Tama Headings RN, MSN, BC-ADM Inpatient Diabetes Coordinator Team Pager [phone] (8a-5p)

## 2022-02-02 NOTE — Patient Care Conference (Signed)
Inpatient RehabilitationTeam Conference and Plan of Care Update Date: 02/02/2022   Time: 11:38 AM    Patient Name: Lauren [name]      Medical Record Number: [id number]  Date of Birth: [birth year] Sex: Female         Room/Bed: 4W05C/4W05C-01 Payor Info: Payor: [employer] EM[employer]OYEE / Plan: [place] [employer] / Product Type: *No Product type* /    Admit Date/Time:  01/28/2022  2:40 PM  Primary Diagnosis:  Acute right MCA stroke [facility])  Hospital Problems: Principal Problem:   Acute right MCA stroke ([place]) Active Problems:   Chronic combined systolic and diastolic heart failure ([place])   Hypotension   Ischemic cardiomyopathy    Expected Discharge Date: Expected Discharge Date: 02/19/22  Team Members Present: Physician leading conference: Dr. Leeroy Cha Social Worker Present: Ovidio Kin, LCSW Nurse Present: Dorien Chihuahua, RN PT Present: Other (comment) Verl Dicker, PT) OT Present: Jamey Ripa, OT SLP Present: Helaine Chess, SLP PPS Coordinator present : Ileana Ladd, PT     Current Status/Progress Goal Weekly Team Focus  Bowel/Bladder   Incont of b&b.  Gain cont of b&b  Assess q shift and toilet prn.   Swallow/Nutrition/ Hydration   regular and thin, 10/2 reduced to intermittment keep a close eye, might need to be increased again.  Mod I  swallow strategies, left pocketing, left spillage and positioning   ADL's   max A UB dressing, LB bathing and dressing, max a toileting and transfer, min A grooming w/c level, limited awarenss of L UE and management of extremeity  min A  progress L hemi awarenss and techniques for ADL's and mobility   Mobility   min bed mobility, min lateral transfer, max stand pivot  CGA  bed mobility, transfers, gait, hemi management, pt/fam education   Communication             Safety/Cognition/ Behavioral Observations  max-mod A  supervision A  intellectual awareness, problem solving, recall, orientation and sustianed attention   Pain   0 pain   remain pain free.  assess qshift and prn.   Skin   No current skin breakdown.  remain free of skin breakdown  assess qshift and prn     Discharge Planning:  HOme with hsband and daughter to assist-between both can provide 24/7. Pt unrealistic regarding her level and care needs. Will ask neuro-psych to see.   Team Discussion: Patient with left inattention and able to weight bear on left LE with CAM boot on. Encourage fluids. Patient with flat affect, "flippant" at times, with off-hand comments or lack of concern about seriousness and not invested in therapy sessions post right MCA CVA.  Patient on target to meet rehab goals: Currently needs min assist for bed mobility, max assist to complete stand -pivot transfers. Needs CGA for sit - stand with a RW. Needs max assist for upper body bathing and dressing and toileting. Needs mod - max assist for cognition. Goals for discharge set for min assist overall.   *See Care Plan and progress notes for long and short-term goals.   Revisions to Treatment Plan:  Resting hand splint Neuro psych referral Dietician consult for CMM diet restrictions   Teaching Needs: Safety, medications, secondary risk management, transfers, toileting, etc.  Current Barriers to Discharge: Decreased caregiver support and Behavior  Possible Resolutions to Barriers: Family education HH follow up services DME: TTB/Shower bench, BSC, w/c     Medical Summary Current Status: Cr 1.64; inconitnent at times; Oxy prn; LBM yesterday;  dressing on LLE from ray amputation;  Barriers to Discharge: Behavior;Decreased family/caregiver support;Home enviroment access/layout;Medical stability;Medication compliance;Weight bearing restrictions;Weight;Incontinence;Wound care  Barriers to Discharge Comments: going home wiht husband that works and daughter who might be able to take off- CAM boot- WBAT on LLE Possible Resolutions to [employer]: limited by noncompliance- won't eat  right;  declined neuropsych; DM coordinator- regarding out of ocntrol BG's; more regular tone on LUE- trying estim;  L inattention-intermittent supervision for eating; general asp precautions; cognition- mod-max A; no awareness of issues; goals supervision- family needs to not breing in food;    CGA- min A goals- d/c-  10/20   Continued Need for Acute Rehabilitation Level of Care: The patient requires daily medical management by a physician with specialized training in physical medicine and rehabilitation for the following reasons: Direction of a multidisciplinary physical rehabilitation program to maximize functional independence : Yes Medical management of patient stability for increased activity during participation in an intensive rehabilitation regime.: Yes Analysis of laboratory values and/or radiology reports with any subsequent need for medication adjustment and/or medical intervention. : Yes   I attest that I was present, lead the team conference, and concur with the assessment and plan of the team.   Dorien Chihuahua B 02/02/2022, 3:14 PM

## 2022-02-02 NOTE — Progress Notes (Signed)
Occupational Therapy Session Note  Patient Details  Name: Lauren [name] MRN: [id number] Date of Birth: 1980-11-25  Today's Date: 02/02/2022 OT Individual Time: 0800-0900 OT Individual Time Calculation (min): 60 min    Short Term Goals: Week 1:  OT Short Term Goal 1 (Week 1): Pt to be Mod A for UB dressing OT Short Term Goal 2 (Week 1): Pt to be Mod A for LB dressing OT Short Term Goal 3 (Week 1): Pt to be Mod A for toileting OT Short Term Goal 4 (Week 1): Pt to demonstrate improved attention to L side with no more than 3 verbal cues OT Short Term Goal 5 (Week 1): Pt to perform functional transfers with Min A  Skilled Therapeutic Interventions/Progress Updates:  Pt seen for am self care session with focus on L sided awareness, positioning, mobility safety and hemi techniques integration. Pt eager to use the toilet despite NT just taking pt off bed pan in bed and demo moderate impulsivity while OT set up safety environment to transfer and aply CAM boot to L LE. OT assisted with min A for supine to sit at EOB, mod A lateral squat pivot transfer bed to w/c to toilet with 3 in 1 over top and then to shower chair. OT assisted with doffing LB garments and peri hygiene after voiding with mod A. Noted increased tone as pt appears to be moving to Brunstrom stage 2. OT covered L foot dressing once on tub transfer bench and provided mod cues throughout shower for R UE one handed hemi techniques with mod A UB, max A LB bathing seated. Weight shifts with mod support for peri bathing. W/c level sink side UB dressing with mod A with pull over bra technique and pull on shirt with R hemi techniques. Max A incontinent breif and pull on pants, D sock and tennis shoe R LE and total A L CAM boot. Stood with mod A at [facility] with L walker splint attached to pull up pants. Applied L UE arm trough and placed UE in towel for padding for now. Left pt with NT De for continued care.   Therapy Documentation Precautions:   Precautions Precautions: Fall Precaution Comments: L inattention, R gaze preference, L hemiplegia, no dressing to L foot - wear post-op shoe OOB Required Braces or Orthoses: Other Brace Other Brace: dressing on L foot Restrictions Weight Bearing Restrictions: No     Therapy/Group: Individual Therapy  [name] 02/02/2022, 8:00 AM

## 2022-02-02 NOTE — Progress Notes (Addendum)
Patient ID: Lauren [name], female   DOB: 08-25-1980, 41 y.o.   MRN: [id number] Met with pt and husband who is present in her room. Pt is fast asleep so update husband on team conference goals of min-CGA level and target discharge date of 10/20. Made him aware she will need 24/7 physical care at discharge. He and their daughter will come in closer to discharge and do hands on education to learn her care for home. He is very supportive and will do what is needed to assist her but does need to work also. Made aware to bring healthy food form home due to her BS are not controlled at this point. Diabetic educator was also in the room when worker came in, discussing foods and sugar levels. Will continue to work on discharge needs. Husband aware if any FMLA or STD forms needed to be complete to bring them in for MD/PA to complete

## 2022-02-02 NOTE — Progress Notes (Signed)
PROGRESS NOTE   Subjective/Complaints:  Pt reports she doesn't know when had LBM but doesn't feel constipated.  Ate 100% tray   ROS:   Pt denies SOB, abd pain, CP, N/V/C/D, and vision changes   Objective:   No results found. Recent Labs    02/01/22 0611 02/02/22 0506  WBC 6.9 9.2  HGB 7.3* 7.7*  HCT 23.1* 24.1*  PLT 535* 513*   Recent Labs    02/01/22 0611 02/02/22 0506  NA 138 138  K 3.6 4.0  CL 111 111  CO2 21* 22  GLUCOSE 185* 241*  BUN 24* 24*  CREATININE 1.59* 1.64*  CALCIUM 8.2* 8.5*   No intake or output data in the 24 hours ending 02/02/22 2248       Physical Exam: Vital Signs Blood pressure 125/74, pulse 95, temperature 97.6 F (36.4 C), resp. rate 20, weight 84.7 kg, last menstrual period 11/01/2021, SpO2 98 %.     General: awake, alert, calm, but appears a little not completely with it; sitting up in bed; ate 100% tray; NAD HENT: conjugate gaze; oropharynx moist CV: regular rate but borderline tachycardic;; no JVD Pulmonary: CTA B/L; no W/R/R- good air movement GI: soft, NT, ND, (+)BS Psychiatric: extremely flat Neurological: L inattention noted  Skin: + L 5th toe amputation; wound site open with mild serous drainage, clean wound bed, no erythema or warmth.  Seen wrapped up in ACE wrap- C/D/I   PE from prior encounter: MSK:      No apparent deformity.      Strength:                RUE: 5/5 SA, 5/5 EF, 5/5 EE, 5/5 WE, 5/5 FF, 5/5 FA                 LUE: flaccid, 0/5 throughout                RLE: 5/5 HF, 5/5 KE, 5/5 DF, 5/5 EHL, 5/5 PF                 LLE:  3/5 HF, 2/5 KE, 0/5 DF, 0/5 EHL, 1/5 PF    Neurologic exam:  Cognition: AAO to person, place, time and event.  Language: Fluent, No substitutions or neoglisms.Moderate dysarthria Mood: Pleasant affect, appropriate mood.  Sensation: To light touch intact in BL UEs and LEs  CN: Left facial droop   Assessment/Plan: 1.  Functional deficits which require 3+ hours per day of interdisciplinary therapy in a comprehensive inpatient rehab setting. Physiatrist is providing close team supervision and 24 hour management of active medical problems listed below. Physiatrist and rehab team continue to assess barriers to discharge/monitor patient progress toward functional and medical goals  Care Tool:  Bathing    Body parts bathed by patient: Right arm, Left arm, Chest, Abdomen, Buttocks, Right upper leg, Left upper leg, Face   Body parts bathed by helper: Front perineal area, Right lower leg, Left lower leg     Bathing assist Assist Level: Moderate Assistance - Patient 50 - 74%     Upper Body Dressing/Undressing Upper body dressing   What is the patient wearing?: Bra, Pull over  shirt    Upper body assist Assist Level: Maximal Assistance - Patient 25 - 49%    Lower Body Dressing/Undressing Lower body dressing      What is the patient wearing?: Incontinence brief, Pants     Lower body assist Assist for lower body dressing: Maximal Assistance - Patient 25 - 49%     Toileting Toileting Toileting Activity did not occur (Clothing management and hygiene only): N/A (no void or bm)  Toileting assist Assist for toileting: Moderate Assistance - Patient 50 - 74%     Transfers Chair/bed transfer  Transfers assist     Chair/bed transfer assist level: Moderate Assistance - Patient 50 - 74%     Locomotion Ambulation   Ambulation assist   Ambulation activity did not occur: Safety/medical concerns          Walk 10 feet activity   Assist  Walk 10 feet activity did not occur: Safety/medical concerns        Walk 50 feet activity   Assist Walk 50 feet with 2 turns activity did not occur: Safety/medical concerns         Walk 150 feet activity   Assist Walk 150 feet activity did not occur: Safety/medical concerns         Walk 10 feet on uneven surface  activity   Assist Walk 10  feet on uneven surfaces activity did not occur: Safety/medical concerns         Wheelchair     Assist Is the patient using a wheelchair?: Yes Type of Wheelchair: Manual    Wheelchair assist level: Total Assistance - Patient < 25% Max wheelchair distance: 300 ft    Wheelchair 50 feet with 2 turns activity    Assist        Assist Level: Total Assistance - Patient < 25%   Wheelchair 150 feet activity     Assist      Assist Level: Total Assistance - Patient < 25%   Blood pressure 125/74, pulse 95, temperature 97.6 F (36.4 C), resp. rate 20, weight 84.7 kg, last menstrual period 11/01/2021, SpO2 98 %.  Medical Problem List and Plan: 1. 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             -patient may shower with lines and foot wound covered             -ELOS/Goals: 14-21 days  Con't CIR- PT and OT and SLP Team conference today to determine length of stay Is now WBAT on LLE  2.  Antithrombotics: -DVT/anticoagulation:  Pharmaceutical: Lovenox             -antiplatelet therapy: Brilinta 3. Pain Management: Tylenol, oxycodone as needed 4. Mood/Behavior/Sleep: LCSW to evaluate and provide support             -continue melatonin 3 mg q HS             -antipsychotic agents: n/a 5. Neuropsych/cognition: This patient is capable of making decisions on her own behalf. 6. 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 Fluids/Electrolytes/Nutrition: Strict Is and Os and follow-up chemistries             -dys 3/thin liquids; SLP eval             -  repeat mag/phos tomorrow 8: Right ICA dissection with stent placement: on Brilinta 9: Hypertension: monitor             -continue hydralazine 12.5 mg TID             -continue isosorbide 20 mg BID             -continue Lopressor  37.5 mg BID  10/2- BP is controlled- con't regimen  10/3- had BP of 80s/50s yesterday- hydralazine stopped- is better this AM- will see how does in therapy with standing- concern will drop 10: Hyperlipidemia: continue Lipitor 40 mg daily 11: Coagulopathy s/p RUE embolectomy 10/2021: Eliquis PTA now on Lovenox; f                -follow-up hematology as outpt 12: COPD exacerbation: hypoxia resolved; continue bronchodilators/nebs 13: Chronic diastolic and systolic heart failure:             -strict Is and Os and daily weight             -no diuretics currently             -see meds above   Filed Weights   01/31/22 0500 02/01/22 0518 02/02/22 0500  Weight: 83.7 kg 84.9 kg 84.7 kg   10/3- Weight pretty stable last 3 days- con't to monitor  14: Hypothyroidism: continue Synthroid 125 mcg daily; follow-up TSH 15: Acute on chonic anemia: s/p 1 un PCs; follow-up CBC  -9/29 HGB stable at 8.2  10/2- Hb down to 7.3- will recheck in AM and make sure improving/stable 16: DM: CBGs q AC and q HS (A1c = 8%)             -9/29 Continue levemir 25u BID  -Add 2u novolog TID with meals             -continue SSI q AC  10/2- pt having variable BG's eating mainly food from home.   10/3- ate breakfast tray this AM- BG's running from 56 (low yesterday) to 312 yesterday due to foods form home- will ask DM coordinator to help try and manage.   17: Class 2 obesity: dietary counseling 18: Tobacco abuse: cessation counseling; continue nicotine patch 19: AKI atop CKD IV: trend BMP  -9/29 Cr improved to 1.88  10/2- Down to 1.59 and BUN 24- will push fluids and recheck in AM  10/3- Cr 1.64 and BUN 24- basically stable- will recheck Thursday-  20: CAD: stable; treated medically; Dr. Harl Bowie 21: Left foot osteomyelitis s/p ray amputation [name] -recent bone biopsy; cultures neg - Husband to bring in ?CAM boot for weightbearing, protection - Remove IJ order - 9/30 Noted surgical site dehiscence with PT  ambulation, was not wearing orthotic. Podiatry consulted, imaging showing mildly angulated fracture of the distal fourth metatarsal (likely iatrogenic from prior amputation)               - Orders for betadyne dressing daily               - NWB LLE               - Appreciate podiatry recs, to discuss 4th MT fx as group next week  10/2- Podiatry says once has CAM boot to be worn- either from home or for [name] to order- d/w PA-  and will check in to it. Supposedly had a dehiscence but podiatry to see again today.   10/3- podiatry said they'd come back for possible  bedside debridement- can WBAT now with CAM boot 22: Leukocytosis: trending down; follow-up CBC  -9/29 WBC 9/5 today, improved 23. Dysphagia  -Advanced to heart healthy/carb modified/ thin liquids            - 9/30 - family requesting diet liberalization as patient primarily eating foods brought in from outside d/t hospital food bland; carb modified diet unable to be ordered with less restrictive parameters. Dietary consulted.   24. RSV  10/2- will take off Droplet precautions- no more Sx's.    I spent a total of 53   minutes on total care today- >50% coordination of care- due to d/w PA about orthostatic hypotension and review of notes, cardiac notes which were different with NP and physician; also team conference and d/w nursing    LOS: 5 days A FACE TO FACE EVALUATION WAS PERFORMED  Lauren [name] 02/02/2022, 9:22 AM

## 2022-02-02 NOTE — Progress Notes (Signed)
Physical Therapy Session Note  Patient Details  Name: Lauren [name] MRN: [id number] Date of Birth: May 19, 1980  Today's Date: 02/02/2022 PT Individual Time: 2778-2423 PT Individual Time Calculation (min): 56 min & 59 min    Short Term Goals: Week 1:  PT Short Term Goal 1 (Week 1): Pt will perform bed mobility with increased attn to LUE and consistent MinA. PT Short Term Goal 2 (Week 1): pt will perform sit<>stand with CGA and improved midline positioning. PT Short Term Goal 3 (Week 1): Pt will perform stand pivot transfers with consistent minA. PT Short Term Goal 4 (Week 1): Pt will initiate gait training using LRAD. PT Short Term Goal 5 (Week 1): Pt will initiate stair training.  Skilled Therapeutic Interventions/Progress Updates:      Therapy Documentation Precautions:  Precautions Precautions: Fall Precaution Comments: L inattention, R gaze preference, L hemiplegia, no dressing to L foot - wear post-op shoe OOB Required Braces or Orthoses: Other Brace Other Brace: dressing on L foot Restrictions Weight Bearing Restrictions: No  Treatment Session 1:  Pt received seated in w/c at bedside with CAM boot donned. Pt reports difficulty adjusting to new functional deficits and PT provided emotional support and offered neuropsychologist referall and pt declined. Pt reports HA, pre-medicated. Pt transported by w/c to dayroom for time management and energy conservation. Pt participated in blocked practice of sit to stand transfers x 5 with RW (ace wrapped donned R UE) and without RW with PT assist. Pt requires min A  to prevent L knee buckle. In standing pt progressed to standing for 15 seconds and practiced weight shifting laterally to prepare for gait. Pt reports SOB despite prolonged rest break and pulse 76 and SPO2 100%. Pt performed left knee extension 2 x 6 to increase quad activation. Pt transported by w/c to room and left seated in w/c at bedside with alarm on and all needs within  reach.   Treatment Session 2:  Pt received semi-reclined in bed, reported need to utilize restroom. Pt denies pain. Pt min A with supine to sit and CGA with stedy transfer to toilet. Deferred to stedy transfer due to pt's urgency to void. Pt transported by w/c to main gym and participated in gait training in parallel bars with min A. Pt ambulated 10 ft x 3 with close w/c follow due to increased fatigue. Pt demonstrates adequate foot clearance of L LE during swing, and requires tactile cueing at pelvis to facilitate weight shift. Pt reported lightheadedness and vitals assessed with pulse of 82, O2 of 100% and BP: 112/80 (91). Pt transported by w/c to room and mod A with stand pivot transfer to bed and CGA with sit to lying. Pt left semi-reclined in bed with all needs in reach and alarm on. Plan to initiate gait training with lite gait in follow up PT session.    Therapy/Group: Individual Therapy  Verl Dicker Verl Dicker PT, DPT  02/02/2022, 7:42 AM

## 2022-02-02 NOTE — Progress Notes (Signed)
Orthopedic Tech Progress Note Patient Details:  Lauren [name] [birth year] [id number]  Patient ID: Lauren [name], female   DOB: 1980-12-04, 41 y.o.   MRN: [id number] I called order into hanger [name] 02/02/2022, 6:20 AM

## 2022-02-02 NOTE — Progress Notes (Signed)
Speech Language Pathology Daily Session Note  Patient Details  Name: Lauren [name] MRN: [id number] Date of Birth: 1980-10-01  Today's Date: 02/02/2022 SLP Individual Time: 0900-0928 SLP Individual Time Calculation (min): 28 min  Short Term Goals: Week 1: SLP Short Term Goal 1 (Week 1): Pt will demonstrate tolerance of regular diet textures with thin liquids with minimal to no s/sx concerning for airway compromise when given Sup A for implementation of safe swallowing strategies (small + single bites/sips, slow rate). SLP Short Term Goal 2 (Week 1): Pt will demonstrate increased awareness of current deficits s/p CVA by naming 3 physical and/or 3 cognitive deficits given Min A verbal cues. SLP Short Term Goal 3 (Week 1): Pt will recall 3 pieces of novel information following 5 minute delay given Min A verbal cues. SLP Short Term Goal 4 (Week 1): Pt will complete basic visual and verbal problem-solving tasks r/t iADL and ADL completion given Min A verbal cues. SLP Short Term Goal 5 (Week 1): Pt will utilize visual scanning strategies to complete tasks targeting L inattention with 100% completion given Mod A verbal and visual cues.  Skilled Therapeutic Interventions: Skilled treatment session focused on cognitive goals. Upon arrival, patient was awake while upright in the wheelchair. SLP facilitated session by providing overall Max A multimodal cues for attention due to lethargy with patient closing her eyes intermittently throughout session. Patient reports this is due to not sleeping well. SLP also facilitated session by providing max A multimodal cues for left visual scanning, problem solving and error awareness during a calendar task in which patient had to input appointments. Overall, patient with poor awareness regarding difficulty of task. Patient requested pain medications at end of session due to a mild headache. Nursing aware. Patient left upright in the wheelchair with alarm on and all needs  within reach. Continue with current plan of care.      Pain Mild Headache 5/10: Nursing made aware and administered medications   Therapy/Group: Individual Therapy  Raunak Antuna 02/02/2022, 10:44 AM

## 2022-02-02 NOTE — Patient Outreach (Signed)
  Care Coordination   Case closure   Visit Note   02/02/2022 Name: Lauren [name] MRN: [id number] DOB: 09/21/1980  Lauren [name] is a 41 y.o. year old female who sees Lemmie Evens, MD for primary care. I  completed case closure as patient has care management services via [facility] health Inpatient rehab staff Outreach to [facility] leadership  What matters to the patients health and wellness today?  N/a    Goals Addressed   None     SDOH assessments and interventions completed:  No     Care Coordination Interventions Activated:  No  Care Coordination Interventions:  No, not indicated   Follow up plan: No further intervention required.   Encounter Outcome:  Pt. Visit Completed   Chanceler Pullin L. Lavina Hamman, RN, BSN, [place] Coordinator Office number [phone]

## 2022-02-03 ENCOUNTER — Inpatient Hospital Stay (HOSPITAL_COMMUNITY): Payer: No Typology Code available for payment source

## 2022-02-03 DIAGNOSIS — I5042 Chronic combined systolic (congestive) and diastolic (congestive) heart failure: Secondary | ICD-10-CM

## 2022-02-03 LAB — GLUCOSE, CAPILLARY
Glucose-Capillary: 264 mg/dL — ABNORMAL HIGH (ref 70–99)
Glucose-Capillary: 199 mg/dL — ABNORMAL HIGH (ref 70–99)
Glucose-Capillary: 153 mg/dL — ABNORMAL HIGH (ref 70–99)
Glucose-Capillary: 215 mg/dL — ABNORMAL HIGH (ref 70–99)
Glucose-Capillary: 264 mg/dL — ABNORMAL HIGH (ref 70–99)
Glucose-Capillary: 210 mg/dL — ABNORMAL HIGH (ref 70–99)

## 2022-02-03 MED ORDER — METOPROLOL TARTRATE 25 MG PO TABS
25.0000 mg | ORAL_TABLET | Freq: Two times a day (BID) | ORAL | Status: DC
  Administered 2022-02-03: 25 mg via ORAL
  Filled 2022-02-03 (×2): qty 1

## 2022-02-03 MED ORDER — NITROGLYCERIN 0.4 MG SL SUBL
0.4000 mg | SUBLINGUAL_TABLET | SUBLINGUAL | Status: DC | PRN
  Administered 2022-02-03 – 2022-02-08 (×5): 0.4 mg via SUBLINGUAL
  Filled 2022-02-03 (×2): qty 1

## 2022-02-03 MED ORDER — SODIUM CHLORIDE 0.9% FLUSH: 3.0000 mL | INTRAVENOUS | Status: DC | PRN

## 2022-02-03 MED ORDER — SODIUM CHLORIDE 0.9 % IV SOLN: 250.0000 mL | INTRAVENOUS | Status: DC | PRN

## 2022-02-03 MED ORDER — SODIUM CHLORIDE 0.9% FLUSH
3.0000 mL | Freq: Two times a day (BID) | INTRAVENOUS | Status: DC
  Administered 2022-02-04 – 2022-02-18 (×24): 3 mL via INTRAVENOUS

## 2022-02-03 NOTE — Progress Notes (Addendum)
   PT report to RN seated BP 79/54 HR 76; RN retook vitals lying93/63, HR 80, temp, 98.0 sats, 100% RA, BS 199. Patient reporting nausea as well. PA notified

## 2022-02-03 NOTE — Progress Notes (Signed)
Physical Therapy Session Note  Patient Details  Name: Lauren [name] MRN: [id number] Date of Birth: 05-25-1980  Today's Date: 02/03/2022 PT Individual Time: 1006-1033 PT Individual Time Calculation (min): 27 min   Short Term Goals: Week 1:  PT Short Term Goal 1 (Week 1): Pt will perform bed mobility with increased attn to LUE and consistent MinA. PT Short Term Goal 2 (Week 1): pt will perform sit<>stand with CGA and improved midline positioning. PT Short Term Goal 3 (Week 1): Pt will perform stand pivot transfers with consistent minA. PT Short Term Goal 4 (Week 1): Pt will initiate gait training using LRAD. PT Short Term Goal 5 (Week 1): Pt will initiate stair training.  Skilled Therapeutic Interventions/Progress Updates: Pt presented in w/c agreeable to therapy. Pt states feels "fine", no c/o pain and no c/o of hypotensive symptoms. PTA checking BP prior to leaving room taken in L arm 79/54 (63) HR 76. PTA discussed with nsg and upon PTA's return pt indicating feeling "hot and nauseous". Nsg checking BS prior to transfer to bed (199). Per nsg request transferred to bed via stand pivot requiring modA for Sit to stand and modA for transfers overall. Pt required minA for sit to supine but was able to perform lateral bridge to reposition in bed with supervision. Pt left in bed at end of session with bed alarm on, call bell within reach and RN retaking vitals. Pt missed 33 min skilled PT due to hypotension.      Therapy Documentation Precautions:  Precautions Precautions: Fall Precaution Comments: L inattention, R gaze preference, L hemiplegia, no dressing to L foot - wear post-op shoe OOB Required Braces or Orthoses: Other Brace Other Brace: dressing on L foot Restrictions Weight Bearing Restrictions: No General: PT Amount of Missed Time (min): 33 Minutes PT Missed Treatment Reason: Other (Comment) Vital Signs:  Pain: Pain Assessment Pain Scale: 0-10 Pain Score: 8  Pain Type: Acute  pain Pain Location: Neck Pain Orientation: Mid Pain Descriptors / Indicators: Aching Pain Frequency: Intermittent Pain Onset: On-going Pain Intervention(s): Medication (See eMAR) Multiple Pain Sites: Yes 2nd Pain Site Pain Score: 8 Pain Location: Back Pain Orientation: Upper Pain Frequency: Intermittent Pain Onset: On-going Pain Intervention(s): Medication (See eMAR) Mobility:   Locomotion :    Trunk/Postural Assessment :    Balance:   Exercises:   Other Treatments:      Therapy/Group: Individual Therapy  Candida Vetter 02/03/2022, 12:18 PM

## 2022-02-03 NOTE — Progress Notes (Signed)
PA requesting Manual SW:VTVNR 90/68 asymptomatic

## 2022-02-03 NOTE — Progress Notes (Signed)
Speech Language Pathology Daily Session Note  Patient Details  Name: Lauren [name] MRN: [id number] Date of Birth: 03-Jan-1981  Today's Date: 02/03/2022 SLP Individual Time: 1130-1158 SLP Individual Time Calculation (min): 28 min  Short Term Goals: Week 1: SLP Short Term Goal 1 (Week 1): Pt will demonstrate tolerance of regular diet textures with thin liquids with minimal to no s/sx concerning for airway compromise when given Sup A for implementation of safe swallowing strategies (small + single bites/sips, slow rate). SLP Short Term Goal 2 (Week 1): Pt will demonstrate increased awareness of current deficits s/p CVA by naming 3 physical and/or 3 cognitive deficits given Min A verbal cues. SLP Short Term Goal 3 (Week 1): Pt will recall 3 pieces of novel information following 5 minute delay given Min A verbal cues. SLP Short Term Goal 4 (Week 1): Pt will complete basic visual and verbal problem-solving tasks r/t iADL and ADL completion given Min A verbal cues. SLP Short Term Goal 5 (Week 1): Pt will utilize visual scanning strategies to complete tasks targeting L inattention with 100% completion given Mod A verbal and visual cues.  Skilled Therapeutic Interventions:Skilled ST services focused on cognitive skills. Pt expressed increased fatigue due to low BP and nausea. Pt was agreeable to participate in ST services while in bed. SLP facilitated left scanning, basic problem solving and sustained attention in PEG design task, pt required mod A verbal cues. Pt demonstrated improvement in awareness of deficits " is this suppose to make me feel" referencing to increase difficulty had completing the designs. SLP provided emotional support and education on neurogenic rehabilitation process. Pt was left in room with call bell within reach and bed alarm set. SLP recommends to continue skilled services.     Pain Pain Assessment Pain Score: 0-No pain  Therapy/Group: Individual Therapy  Kanai Hilger 02/03/2022, 2:00 PM

## 2022-02-03 NOTE — Progress Notes (Addendum)
PT report to RN seated BP 79/54 HR 76; RN retook vitals lying93/63, HR 80, temp, 98.0 sats, 100% RA, BS 199. Patient reporting nausea as well. PA notified

## 2022-02-03 NOTE — Progress Notes (Signed)
Physical Therapy Session Note  Patient Details  Name: Lauren [name] MRN: [id number] Date of Birth: 12/23/1980  Today's Date: 02/03/2022 PT Individual Time: 0800-0844 PT Individual Time Calculation (min): 44 min   Short Term Goals: Week 1:  PT Short Term Goal 1 (Week 1): Pt will perform bed mobility with increased attn to LUE and consistent MinA. PT Short Term Goal 2 (Week 1): pt will perform sit<>stand with CGA and improved midline positioning. PT Short Term Goal 3 (Week 1): Pt will perform stand pivot transfers with consistent minA. PT Short Term Goal 4 (Week 1): Pt will initiate gait training using LRAD. PT Short Term Goal 5 (Week 1): Pt will initiate stair training.  Skilled Therapeutic Interventions/Progress Updates:      Pt in bed eating breakfast with RN administering morning Rx. Pt agreeable to PT tx and reports some L foot pain - RN aware. Pt slumped in bed, leaning to her L while eating her breakfast. Pt reports being aware when asked and she corrects when cued but then quickly returns to slumped position. Educated on importance of upright sitting to reduce risk of aspiration and improve midline awareness. Pt also unable to locate bottle of water that was in front of her, slightly to her L - pt reports decreased ability to locate items on her L visual field.  Supine<>sitting with HOB elevated, modA for trunk support and L hemibody management. Donned pants with maxA and tennis shoes with dependant assist. Required modA for UB dressing with education on hemi technique.   Stand<>pivot transfer with modA from EOB to w/c, towards her stronger R side.   Transported to main rehab gym to focus remainder of session on gait training using R hand rail in hallway. Pt requires minA for rising to stand using R hand rail and L knee blocked. She was able to ambulate only a few steps with minA and L hand rail before she began to c/o dizziness and lightheadedness, requesting to sit.   BP  sitting: 105/74 BP standing: *Pt unable to tolerate standing long enough to obtain accurate reading.  Worked on w/c propulsion via hemi technique - 26ft with minA with poor motor planning - drifting to her L and unable to maintain straight path - poor motor recruitment on RLE. Pt returned remaining distance to her room for time management.   Direct handoff of care in room to NT as pt requesting to toilet.   RN made aware of pt's request for pain medication as well as her hypotension and orthostasis.    Therapy Documentation Precautions:  Precautions Precautions: Fall Precaution Comments: L inattention, R gaze preference, L hemiplegia, no dressing to L foot - wear post-op shoe OOB Required Braces or Orthoses: Other Brace Other Brace: dressing on L foot Restrictions Weight Bearing Restrictions: No General:     Therapy/Group: Individual Therapy  Alger Simons 02/03/2022, 7:57 AM

## 2022-02-03 NOTE — Progress Notes (Signed)
Pt still hospitalized

## 2022-02-03 NOTE — Consult Note (Signed)
Neuropsychological Consultation   Patient:   Lauren [name]   DOB:   December 29, 1980  MR Number:  [id number]  Location:  [facility] [id number] [place] [address] Dept: [place]: [phone]           Date of Service:   02/03/2022  Start Time:   2 PM End Time:   3 PM  Provider/Observer:  Ilean Skill, Psy.D.       Clinical Neuropsychologist       Billing Code/Service: [phone]  Chief Complaint:    Lauren [name] is a 41 year old female who presented to the ED at [facility] on 05/10/2021 with unsteady gait left-sided weakness and facial droop.  Patient also had left arm weakness and upper extremity thromboembolus and history of coagulopathic.  Imaging revealed age-indeterminate occlusion of proximal right ICA and other ICA stenosis.  Patient underwent left vertebral angiogram with right common carotid angiogra.  Patient also had chronic kidney disease stage IV and nephrology was consulted.  Patient with past medical history including CAD which is medically managed, diabetes, stage IV kidney disease, prior CVA.  Patient also has a history of left foot osteomyelitis status post multiple debridements and partial left fifth ray resection by podiatry.  Patient underwent therapy evaluations and was admitted to the comprehensive inpatient rehabilitation services.  Reason for Service:  Patient was referred for neuropsychological consultation due to coping and adjustment issues with significant changes in motor function and residual effects of her CVA.  Below is the HPI for the current admission.  HPI: Lauren [name] is a 41 year old female who presented to the emergency department on 05/10/2021 with unsteady gait left-sided facial droop left arm weakness with drift and slurred speech.  Code stroke called.  She was taking Eliquis secondary to recent upper extremity thromboembolus and history of  coagulopathy.  Neurology consulted.  Imaging revealed age-indeterminate occlusion of proximal right ICA, V3 segment of right vertebral artery, left paraclinoid ICA stenosis.  She underwent left vertebral arteriogram with right common carotid arteriogram by Dr. Estanislado Pandy. Complete revascularization of the internal carotid artery achieved.  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  She was loaded with aspirin, Brilinta via orogastric tube.  She was left intubated.     Nephrology was consulted for renal failure on 9/10. Cardiology consulted and recent echo revealed some decrease in EF to approximately 30 to 35%.  Anemia noted and she was transfused 1 unit of packed red blood cells.  Elevated troponins with unremarkable EKG and hypertension therefore treated with nitroglycerin and heparin infusion.  Infectious disease consultation obtained on 9/14 due to prior left foot osteomyelitis.  Blood cultures negative.  Augmentin continued.  Hematology consulted on 9/14 for additional anticoagulation recommendations.  Podiatry consulted regarding bone biopsy of the left foot.  Augmentin discontinued.  Bone biopsy of the fourth metatarsal left foot was sent for culture and pathology performed at the bedside by Dr. Amalia Hailey 9/16.  Broad-spectrum antibiotics were adjusted.  She was extubated on 9/18.  Transferred to hospitalist service.  She has had fairly stable hemoglobin since transfusion.  Heparin infusion discontinued on 9/21 and she was started on Lovenox 80 mg twice daily.  Dosing was decreased to every 24 hours on 9/24.  Hypercoagulable work-up negative with some labs pending.  Hematology recommends outpatient referral. Bone culture negative now off antibiotics.   She underwent SPL evaluation and has been upgraded to  dysphagia 3 diet.  BUN and creatinine on downward trend, unknown baseline.  Leukocytosis trending downward.  She has been afebrile over  the past week. The patient requires inpatient physical medicine and rehabilitation evaluations and treatment secondary to dysfunction due to right MCA stroke.   Her past medical history significant for CAD medically managed, diabetes mellitus, stage IV kidney disease, prior CVA and coagulopathy.  She required right upper extremity embolectomy 11/18/2021.  She was placed on Eliquis.  She also has a history of left foot osteomyelitis and is status post multiple debridements and partial left fifth ray resection by podiatry.  Also has history of congestive heart failure and tobacco abuse.  Current Status:  Patient had just finished PT as I entered the room and has been placed in her wheelchair with all desired items within reach.  Patient acknowledged being tired from some of the therapies that she has been doing today but reports good motivation.  Patient acknowledges changes in cognition including changes in overall speed of mental operations and retrieval of information.  Patient reports that she has felt very upset and down herself feeling like she is "stupid."  The patient did report a fear that she would have other strokes in the future but reports motivation to recover so she can go to [employer] with her mother after she improves her mobility.  Behavioral Observation: Lauren [name]  presents as a 41 y.o.-year-old Right handed Caucasian Female who appeared her stated age. her dress was Appropriate and she was Well Groomed and her manners were Appropriate to the situation.  her participation was indicative of Appropriate and Inattentive behaviors.  There were physical disabilities noted.  she displayed an appropriate level of cooperation and motivation.     Interactions:    Active Appropriate and Redirectable  Attention:   abnormal and attention span appeared shorter than expected for age  Memory:   abnormal; remote memory intact, recent memory impaired  Visuo-spatial:  not examined  Speech  (Volume):  low  Speech:   normal; slurred  Thought Process:  Coherent and Relevant  Though Content:  WNL; not suicidal and not homicidal  Orientation:   person, place, time/date, and situation  Judgment:   Fair  Planning:   Poor  Affect:    Depressed and Lethargic  Mood:    Dysphoric  Insight:   Fair  Intelligence:   normal  Medical History:   Past Medical History:  Diagnosis Date   Anemia    CAD (coronary artery disease)    a. s/p cath in 03/2014 showing 30% mid-LAD, moderate to severe disease along small D1, patent LCx, moderate to severe distal OM2 stenosis and moderate diffuse diease along RCA not amenable to PCI   CHF (congestive heart failure) ([place])    a. EF 55-60% in 12/2019 b. EF at 35-40% by echo in 05/2020   CKD (chronic kidney disease) stage 4, GFR 15-29 ml/min (HCC)    Diabetes mellitus without complication ([place])    Myocardial infarction ([place])    Neuropathy    Stroke [facility])          Patient Active Problem List   Diagnosis Date Noted   Hypotension    Ischemic cardiomyopathy    Acute right MCA stroke ([place]) 01/28/2022   Elevated troponin    Aspiration pneumonia ([place]) 01/18/2022   Acute respiratory failure with hypoxia (HCC)    HFrEF (heart failure with reduced ejection fraction) ([place])    Right carotid artery occlusion 01/08/2022  Internal carotid artery occlusion, 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   Menorrhagia with irregular cycle 12/15/2021   Pregnancy examination or test, negative result 12/15/2021   Encounter for IUD insertion 12/15/2021   Critical ischemia of upper extremity ([place]) 11/18/2021   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 ([place]) 11/18/2021   Cellulitis of left foot 09/02/2021   Acute osteomyelitis of left foot ([place]) 09/02/2021   Foot ulcer ([place]) 08/27/2021   Sepsis ([place]) 08/27/2021   Hypokalemia 08/27/2021   Cellulitis 06/18/2021   Chronic combined systolic and diastolic heart failure ([place]) 06/18/2021   History of CVA (cerebrovascular accident)  06/18/2021   Encounter for cervical Pap smear with pelvic exam 07/23/2020   Menorrhagia with regular cycle 07/23/2020   Iron deficiency anemia due to chronic blood loss 07/23/2020   Menstrual cramps 07/23/2020   Class 2 obesity 06/18/2020   CAD (coronary artery disease) 06/18/2020   CKD stage G3b/A2, GFR 30-44 and albumin creatinine ratio 30-299 mg/g (HCC) 06/18/2020   Unstable angina ([place]) 06/18/2020   Mixed hyperlipidemia    Class 2 obesity due to excess calories without serious comorbidity with body mass index (BMI) of 39.0 to 39.9 in adult    Acute renal failure superimposed on stage 4 chronic kidney disease ([place]) 01/01/2020   Acute ischemic stroke ([place]) 01/01/2020   Tobacco abuse 01/01/2020   Chest pain 12/31/2019   Cerebrovascular accident (CVA) ([place]) 12/31/2019   Left sided numbness 12/31/2019   Vitamin D deficiency 07/24/2015   Essential hypertension 03/17/2015   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  Family Med/Psych History:  Family History  Problem Relation Age of Onset   Thyroid disease Mother    Hypertension Mother    Hyperlipidemia Mother    CAD Mother    CVA Mother    Hyperlipidemia Father    Hypertension Father    CAD Father    Breast cancer Paternal Grandmother 3   Cancer Paternal Grandmother        lung    Impression/DX:  Lauren [name] is a 41 year old female who presented to the ED at [facility] on 05/10/2021 with unsteady gait left-sided weakness and facial droop.  Patient also had left arm weakness and upper extremity thromboembolus and history of coagulopathic.  Imaging revealed age-indeterminate occlusion of proximal right ICA and other ICA stenosis.  Patient underwent left vertebral angiogram with right common carotid angiogra.  Patient also had chronic kidney disease stage IV and nephrology was consulted.  Patient with  past medical history including CAD which is medically managed, diabetes, stage IV kidney disease, prior CVA.  Patient also has a history of left foot osteomyelitis status post multiple debridements and partial left fifth ray resection by podiatry.  Patient underwent therapy evaluations and was admitted to the comprehensive inpatient rehabilitation services.  Patient had just finished PT as I entered the room and has been placed in her wheelchair with all desired items within reach.  Patient acknowledged being tired from some of the therapies that she has been doing today but reports good motivation.  Patient acknowledges changes in cognition including changes in overall speed of mental operations and retrieval of information.  Patient reports that she has felt very upset and down herself feeling like she is "stupid."  The patient did report a fear that she would have other strokes in the future but reports motivation to recover so she can go to  [place] with her mother after she improves her mobility.  Disposition/Plan:  Today we worked on coping and adjustment issues with particular focus on the way the patient has been catastrophizing her future status and assuming her current status will be a permanent status.  While certainly where she will be in the future cannot be definitively determined currently the patient has made significant gains in function over the past week and continues to have 2 more weeks within the rehab facility etc.  We worked on developing more appropriate cognitive interpretations of her status to help the patient maintain motivation and effort levels.           Electronically Signed   _______________________ Ilean Skill, Psy.D. Clinical Neuropsychologist

## 2022-02-03 NOTE — Progress Notes (Addendum)
Nursing reporting low BP and nausea. SaO2 is 100%. Pulse by me is approx. 65 and regular. Lungs clear. Have asked for manual BP and Compazine. Reviewed [facility] notes and conferred with Dr. Dagoberto Ligas. Will decrease metoprolol to 25 mg BID.  Manual BP 90/68

## 2022-02-03 NOTE — Progress Notes (Signed)
  Subjective:  Patient ID: Lauren [name], female    DOB: Jul 25, 1980,  MRN: [id number]  {ros ortho:[id number]} Objective:   Vitals:   02/02/22 1921 02/03/22 0404  BP: 130/77 (!) 117/57  Pulse: 93 (!) 101  Resp: 18 18  Temp: 98 F (36.7 C) 98.1 F (36.7 C)  SpO2: 100% 100%   General AA&O x3. Normal mood and affect.  Vascular Dorsalis pedis and posterior tibial pulses 2/4 bilat. Brisk capillary refill to all digits. Pedal hair present.  Neurologic Epicritic sensation grossly intact.  Dermatologic No open lesions. Interspaces clear of maceration. Nails well groomed and normal in appearance.  Orthopedic: MMT 5/5 in dorsiflexion, plantarflexion, inversion, and eversion. Normal joint ROM without pain or crepitus.    Assessment & Plan:  Patient was evaluated and treated and all questions answered.  *** -  Criselda Peaches, DPM  Accessible via secure chat for questions or concerns.

## 2022-02-03 NOTE — Progress Notes (Signed)
Patient requesting grounds pass for outside:PA notified, order provided. Sitting BP 95/59, MAP 71, O2sats100% HR 93, asymptomatic

## 2022-02-03 NOTE — Progress Notes (Signed)
Occupational Therapy Session Note  Patient Details  Name: Lauren [name] MRN: [id number] Date of Birth: Feb 27, 1981  Today's Date: 02/03/2022 OT Individual Time: 1304-1405 OT Individual Time Calculation (min): 61 min    Short Term Goals: Week 1:  OT Short Term Goal 1 (Week 1): Pt to be Mod A for UB dressing OT Short Term Goal 2 (Week 1): Pt to be Mod A for LB dressing OT Short Term Goal 3 (Week 1): Pt to be Mod A for toileting OT Short Term Goal 4 (Week 1): Pt to demonstrate improved attention to L side with no more than 3 verbal cues OT Short Term Goal 5 (Week 1): Pt to perform functional transfers with Min A  Skilled Therapeutic Interventions/Progress Updates:  Pt greeted supine in bed, pt agreeable to OT intervention. Pt experiencing OH today therefore BPs assessed throughout session.  Supine- 115/79 ( 92) HR 89 Pt completed supine>sit with MIN A to L side EOB- 113/77 ( 87) HR 91         Pt reports need to void bladder, stand pivot transfer to [facility] with MOD A to R side.  BP from [facility]- 99/66 ( 77) HR 90, PA enter while on BSC approved TEDS on RLE with ace wrap on LLE to assist with hypotension, donned both during session.  Pt needed MAX A for 3/3 toileting tasks with pt declining to attempt pericare. Pt with + small BM.  With teds and ace wrap BP- 101/73 ( 82) HR 89 Transported pt to gym to work on LUE AROM. Pt was able to pronate wrist from neutral position, pt needed MIN A to facilitate horizontal shoulder ABD/ADD, pt has sensation proximally but decreased sensation distally.  Pt transported back to room in w/c with total A, pt left up in w/c with alarm belt activated and all needs within reach.  Therapy Documentation Precautions:  Precautions Precautions: Fall Precaution Comments: L inattention, R gaze preference, L hemiplegia, no dressing to L foot - wear post-op shoe OOB Required Braces or Orthoses: Other Brace Other Brace: dressing on L foot Restrictions Weight Bearing  Restrictions: No  Pain: unrated pain reported in L hand that radiates up to L elbow. Rest breaks and repositioning provided as needed.  Therapy/Group: Individual Therapy  [name] 02/03/2022, 3:42 PM

## 2022-02-03 NOTE — Progress Notes (Signed)
PROGRESS NOTE   Subjective/Complaints:  Pt reports she doesn't know when had LBM but doesn't feel constipated.  Ate 100% tray  Per PA note and d/w PA, pt had more orthostatic hypotension again today-  Doesn't remember seeing anyone by a Dr Guy Sandifer yesterday from podiatry.  His note is incomplete, so not sure of plan.   Said doesn't want to wake up again today.   ROS:   Pt denies SOB, abd pain, CP, N/V/C/D, and vision changes   Objective:   No results found. Recent Labs    02/01/22 0611 02/02/22 0506  WBC 6.9 9.2  HGB 7.3* 7.7*  HCT 23.1* 24.1*  PLT 535* 513*   Recent Labs    02/01/22 0611 02/02/22 0506  NA 138 138  K 3.6 4.0  CL 111 111  CO2 21* 22  GLUCOSE 185* 241*  BUN 24* 24*  CREATININE 1.59* 1.64*  CALCIUM 8.2* 8.5*    Intake/Output Summary (Last 24 hours) at 02/03/2022 1309 Last data filed at 02/03/2022 6256 Gross per 24 hour  Intake 360 ml  Output --  Net 360 ml         Physical Exam: Vital Signs Blood pressure (!) 117/57, pulse (!) 101, temperature 98.1 F (36.7 C), resp. rate 18, weight 84.8 kg, last menstrual period 11/01/2021, SpO2 100 %.      General: awake, alert, appropriate, woke from sleep- said doesn't want to wake up;  NAD HENT: R  gaze preference; oropharynx moist CV: borderline tachycardic rate; no JVD Pulmonary: CTA B/L; no W/R/R- good air movement GI: soft, NT, ND, (+)BS Psychiatric: flat, frustrated with being woken up Neurological: sleepy but answering questions- poor memory Skin: + L 5th toe amputation; wound site open with mild serous drainage, clean wound bed, no erythema or warmth.  Seen wrapped up in ACE wrap- C/D/I   PE from prior encounter: MSK:      No apparent deformity.      Strength:                RUE: 5/5 SA, 5/5 EF, 5/5 EE, 5/5 WE, 5/5 FF, 5/5 FA                 LUE: flaccid, 0/5 throughout                RLE: 5/5 HF, 5/5 KE, 5/5 DF, 5/5 EHL,  5/5 PF                 LLE:  3/5 HF, 2/5 KE, 0/5 DF, 0/5 EHL, 1/5 PF    Neurologic exam:  Cognition: AAO to person, place, time and event.  Language: Fluent, No substitutions or neoglisms.Moderate dysarthria Mood: Pleasant affect, appropriate mood.  Sensation: To light touch intact in BL UEs and LEs  CN: Left facial droop   Assessment/Plan: 1. Functional deficits which require 3+ hours per day of interdisciplinary therapy in a comprehensive inpatient rehab setting. Physiatrist is providing close team supervision and 24 hour management of active medical problems listed below. Physiatrist and rehab team continue to assess barriers to discharge/monitor patient progress toward functional and medical goals  Care Tool:  Bathing  Body parts bathed by patient: Right arm, Left arm, Chest, Abdomen, Buttocks, Right upper leg, Left upper leg, Face   Body parts bathed by helper: Front perineal area, Right lower leg, Left lower leg     Bathing assist Assist Level: Moderate Assistance - Patient 50 - 74%     Upper Body Dressing/Undressing Upper body dressing   What is the patient wearing?: Bra, Pull over shirt    Upper body assist Assist Level: Maximal Assistance - Patient 25 - 49%    Lower Body Dressing/Undressing Lower body dressing      What is the patient wearing?: Incontinence brief, Pants     Lower body assist Assist for lower body dressing: Maximal Assistance - Patient 25 - 49%     Toileting Toileting Toileting Activity did not occur (Clothing management and hygiene only): N/A (no void or bm)  Toileting assist Assist for toileting: Moderate Assistance - Patient 50 - 74%     Transfers Chair/bed transfer  Transfers assist     Chair/bed transfer assist level: Moderate Assistance - Patient 50 - 74%     Locomotion Ambulation   Ambulation assist   Ambulation activity did not occur: Safety/medical concerns  Assist level: Minimal Assistance - Patient >  75% Assistive device: Parallel bars Max distance: 30 ft   Walk 10 feet activity   Assist  Walk 10 feet activity did not occur: Safety/medical concerns  Assist level: Minimal Assistance - Patient > 75% Assistive device: Parallel bars   Walk 50 feet activity   Assist Walk 50 feet with 2 turns activity did not occur: Safety/medical concerns         Walk 150 feet activity   Assist Walk 150 feet activity did not occur: Safety/medical concerns         Walk 10 feet on uneven surface  activity   Assist Walk 10 feet on uneven surfaces activity did not occur: Safety/medical concerns         Wheelchair     Assist Is the patient using a wheelchair?: Yes Type of Wheelchair: Manual    Wheelchair assist level: Total Assistance - Patient < 25% Max wheelchair distance: 300 ft    Wheelchair 50 feet with 2 turns activity    Assist        Assist Level: Total Assistance - Patient < 25%   Wheelchair 150 feet activity     Assist      Assist Level: Total Assistance - Patient < 25%   Blood pressure (!) 117/57, pulse (!) 101, temperature 98.1 F (36.7 C), resp. rate 18, weight 84.8 kg, last menstrual period 11/01/2021, SpO2 100 %.  Medical Problem List and Plan: 1. 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             -patient may shower with lines and foot wound covered             -ELOS/Goals: 14-21 days  WBAT On LLE D/c 10/20 Con't CIR PT and OT and SLP- mod-max A for cognition, of note.   2.  Antithrombotics: -DVT/anticoagulation:  Pharmaceutical: Lovenox             -antiplatelet therapy: Brilinta 3. Pain Management: Tylenol, oxycodone as needed 4. Mood/Behavior/Sleep: LCSW to evaluate and provide support             -continue melatonin 3 mg q HS             -  antipsychotic agents: n/a 5.  Neuropsych/cognition: This patient is capable of making decisions on her own behalf. 6. 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 Fluids/Electrolytes/Nutrition: Strict Is and Os and follow-up chemistries             -dys 3/thin liquids; SLP eval             -repeat mag/phos tomorrow 8: Right ICA dissection with stent placement: on Brilinta 9: Hypertension: monitor             -continue hydralazine 12.5 mg TID             -continue isosorbide 20 mg BID             -continue Lopressor 37.5 mg BID  10/2- BP is controlled- con't regimen  10/3- had BP of 80s/50s yesterday- hydralazine stopped- is better this AM- will see how does in therapy with standing- concern will drop  10/4- will decrease Metoprolol to 25 mg BID- not change to succinate, since won't have ability to change meds as often.  10: Hyperlipidemia: continue Lipitor 40 mg daily 11: Coagulopathy s/p RUE embolectomy 10/2021: Eliquis PTA now on Lovenox; f                -follow-up hematology as outpt 12: COPD exacerbation: hypoxia resolved; continue bronchodilators/nebs 13: Chronic diastolic and systolic heart failure:             -strict Is and Os and daily weight             -no diuretics currently             -see meds above   Filed Weights   02/01/22 0518 02/02/22 0500 02/03/22 0500  Weight: 84.9 kg 84.7 kg 84.8 kg   10/4- weight stable-- con't to monitor 14: Hypothyroidism: continue Synthroid 125 mcg daily; follow-up TSH 15: 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   10/3- ate breakfast tray this AM- BG's  running from 56 (low yesterday) to 312 yesterday due to foods form home- will ask DM coordinator to help try and manage.   10/4- CBG's mid 100s to mid 200s- a little better in last 24 hours- no hypoglycemia 17: Class 2 obesity: dietary counseling 18: Tobacco abuse: cessation counseling; continue nicotine patch 19: AKI atop CKD IV: trend BMP  -9/29 Cr improved to 1.88  10/2- Down to 1.59 and BUN 24- will push fluids and recheck in AM  10/3- Cr 1.64 and BUN 24- basically stable- will recheck Thursday-   10/4- ordered BMP for tomorrow 20: CAD: stable; treated medically; Dr. Harl Bowie 21: Left foot osteomyelitis s/p ray amputation [name] -recent bone biopsy; cultures neg - Husband to bring in ?CAM boot for weightbearing, protection - Remove IJ order - 9/30 Noted surgical site dehiscence with PT ambulation, was not  wearing orthotic. Podiatry consulted, imaging showing mildly angulated fracture of the distal fourth metatarsal (likely iatrogenic from prior amputation)               - Orders for betadyne dressing daily               - NWB LLE               - Appreciate podiatry recs, to discuss 4th MT fx as group next week  10/2- Podiatry says once has CAM boot to be worn- either from home or for [name] to order- d/w PA-  and will check in to it. Supposedly had a dehiscence but podiatry to see again today.   10/3- podiatry said they'd come back for possible bedside debridement- can WBAT now with CAM boot 22: Leukocytosis: trending down; follow-up CBC  -9/29 WBC 9.5 today, improved 23. Dysphagia  -Advanced to heart healthy/carb modified/ thin liquids            - 9/30 - family requesting diet liberalization as patient primarily eating foods brought in from outside d/t hospital food bland; carb modified diet unable to be ordered with less restrictive parameters. Dietary consulted.   24. RSV  10/2- will take off Droplet precautions- no more Sx's.    I spent a total of  42  minutes on total care  today- >50% coordination of care- due to d/w PA about pt care with orthostatic hypotension that's occurring.    LOS: 6 days A FACE TO FACE EVALUATION WAS PERFORMED  Sumaiyah Markert 02/03/2022, 1:09 PM

## 2022-02-03 NOTE — Progress Notes (Signed)
OOB on BSC with OT/PT. Nausea improved. BP 99/66. Will apply ace wrap to LLE and TED hose to RLE.

## 2022-02-04 DIAGNOSIS — I2510 Atherosclerotic heart disease of native coronary artery without angina pectoris: Secondary | ICD-10-CM

## 2022-02-04 DIAGNOSIS — I255 Ischemic cardiomyopathy: Secondary | ICD-10-CM

## 2022-02-04 DIAGNOSIS — I200 Unstable angina: Secondary | ICD-10-CM

## 2022-02-04 DIAGNOSIS — I951 Orthostatic hypotension: Secondary | ICD-10-CM

## 2022-02-04 DIAGNOSIS — I5043 Acute on chronic combined systolic (congestive) and diastolic (congestive) heart failure: Secondary | ICD-10-CM

## 2022-02-04 DIAGNOSIS — I2583 Coronary atherosclerosis due to lipid rich plaque: Secondary | ICD-10-CM

## 2022-02-04 LAB — BASIC METABOLIC PANEL
Anion gap: 6 (ref 5–15)
Anion gap: 8 (ref 5–15)
BUN: 27 mg/dL — ABNORMAL HIGH (ref 6–20)
BUN: 27 mg/dL — ABNORMAL HIGH (ref 6–20)
CO2: 21 mmol/L — ABNORMAL LOW (ref 22–32)
CO2: 20 mmol/L — ABNORMAL LOW (ref 22–32)
Calcium: 8.1 mg/dL — ABNORMAL LOW (ref 8.9–10.3)
Calcium: 8.3 mg/dL — ABNORMAL LOW (ref 8.9–10.3)
Chloride: 108 mmol/L (ref 98–111)
Chloride: 107 mmol/L (ref 98–111)
Creatinine, Ser: 2.26 mg/dL — ABNORMAL HIGH (ref 0.44–1.00)
Creatinine, Ser: 2.12 mg/dL — ABNORMAL HIGH (ref 0.44–1.00)
GFR, Estimated: 28 mL/min — ABNORMAL LOW (ref >60)
GFR, Estimated: 30 mL/min — ABNORMAL LOW (ref >60)
Glucose, Bld: 201 mg/dL — ABNORMAL HIGH (ref 70–99)
Glucose, Bld: 160 mg/dL — ABNORMAL HIGH (ref 70–99)
Potassium: 3.9 mmol/L (ref 3.5–5.1)
Potassium: 4 mmol/L (ref 3.5–5.1)
Sodium: 135 mmol/L (ref 135–145)
Sodium: 135 mmol/L (ref 135–145)

## 2022-02-04 LAB — CBC WITH DIFFERENTIAL/PLATELET
Abs Immature Granulocytes: 0.05 10*3/uL (ref 0.00–0.07)
Abs Immature Granulocytes: 0.04 10*3/uL (ref 0.00–0.07)
Basophils Absolute: 0.1 10*3/uL (ref 0.0–0.1)
Basophils Absolute: 0.1 10*3/uL (ref 0.0–0.1)
Basophils Relative: 1 %
Basophils Relative: 1 %
Eosinophils Absolute: 0.3 10*3/uL (ref 0.0–0.5)
Eosinophils Absolute: 0.4 10*3/uL (ref 0.0–0.5)
Eosinophils Relative: 4 %
Eosinophils Relative: 4 %
HCT: 21.6 % — ABNORMAL LOW (ref 36.0–46.0)
HCT: 24.5 % — ABNORMAL LOW (ref 36.0–46.0)
Hemoglobin: 7 g/dL — ABNORMAL LOW (ref 12.0–15.0)
Hemoglobin: 7.8 g/dL — ABNORMAL LOW (ref 12.0–15.0)
Immature Granulocytes: 1 %
Immature Granulocytes: 0 %
Lymphocytes Relative: 23 %
Lymphocytes Relative: 20 %
Lymphs Abs: 2.1 10*3/uL (ref 0.7–4.0)
Lymphs Abs: 2 10*3/uL (ref 0.7–4.0)
MCH: 28.1 pg (ref 26.0–34.0)
MCH: 27.8 pg (ref 26.0–34.0)
MCHC: 32.4 g/dL (ref 30.0–36.0)
MCHC: 31.8 g/dL (ref 30.0–36.0)
MCV: 86.7 fL (ref 80.0–100.0)
MCV: 87.2 fL (ref 80.0–100.0)
Monocytes Absolute: 0.6 10*3/uL (ref 0.1–1.0)
Monocytes Absolute: 0.6 10*3/uL (ref 0.1–1.0)
Monocytes Relative: 7 %
Monocytes Relative: 6 %
Neutro Abs: 6 10*3/uL (ref 1.7–7.7)
Neutro Abs: 6.6 10*3/uL (ref 1.7–7.7)
Neutrophils Relative %: 64 %
Neutrophils Relative %: 69 %
Platelets: 430 10*3/uL — ABNORMAL HIGH (ref 150–400)
Platelets: 462 10*3/uL — ABNORMAL HIGH (ref 150–400)
RBC: 2.49 MIL/uL — ABNORMAL LOW (ref 3.87–5.11)
RBC: 2.81 MIL/uL — ABNORMAL LOW (ref 3.87–5.11)
RDW: 16.6 % — ABNORMAL HIGH (ref 11.5–15.5)
RDW: 16.6 % — ABNORMAL HIGH (ref 11.5–15.5)
WBC: 9.1 10*3/uL (ref 4.0–10.5)
WBC: 9.5 10*3/uL (ref 4.0–10.5)
nRBC: 0 % (ref 0.0–0.2)
nRBC: 0 % (ref 0.0–0.2)

## 2022-02-04 LAB — GLUCOSE, CAPILLARY
Glucose-Capillary: 157 mg/dL — ABNORMAL HIGH (ref 70–99)
Glucose-Capillary: 187 mg/dL — ABNORMAL HIGH (ref 70–99)
Glucose-Capillary: 211 mg/dL — ABNORMAL HIGH (ref 70–99)
Glucose-Capillary: 265 mg/dL — ABNORMAL HIGH (ref 70–99)

## 2022-02-04 LAB — PREPARE RBC (CROSSMATCH)

## 2022-02-04 LAB — TROPONIN I (HIGH SENSITIVITY)
Troponin I (High Sensitivity): 174 ng/L (ref <18)
Troponin I (High Sensitivity): 181 ng/L (ref <18)

## 2022-02-04 LAB — OCCULT BLOOD X 1 CARD TO LAB, STOOL: Fecal Occult Bld: NEGATIVE

## 2022-02-04 MED ORDER — FUROSEMIDE 40 MG PO TABS: 40.0000 mg | ORAL_TABLET | Freq: Every day | ORAL | Status: DC

## 2022-02-04 MED ORDER — FUROSEMIDE 20 MG PO TABS
10.0000 mg | ORAL_TABLET | Freq: Every day | ORAL | Status: DC
  Administered 2022-02-04: 10 mg via ORAL
  Filled 2022-02-04: qty 1

## 2022-02-04 MED ORDER — PANTOPRAZOLE SODIUM 40 MG PO TBEC
40.0000 mg | DELAYED_RELEASE_TABLET | Freq: Every day | ORAL | Status: DC
  Administered 2022-02-04 – 2022-02-19 (×16): 40 mg via ORAL
  Filled 2022-02-04 (×17): qty 1

## 2022-02-04 MED ORDER — CADEXOMER IODINE 0.9 % EX GEL
Freq: Every day | CUTANEOUS | Status: AC
  Filled 2022-02-04: qty 40

## 2022-02-04 MED ORDER — METOPROLOL TARTRATE 25 MG PO TABS
37.5000 mg | ORAL_TABLET | Freq: Two times a day (BID) | ORAL | Status: DC
  Administered 2022-02-04 – 2022-02-19 (×30): 37.5 mg via ORAL
  Filled 2022-02-04 (×30): qty 1

## 2022-02-04 MED ORDER — FUROSEMIDE 40 MG PO TABS
40.0000 mg | ORAL_TABLET | Freq: Every day | ORAL | Status: DC
  Administered 2022-02-05 – 2022-02-15 (×11): 40 mg via ORAL
  Filled 2022-02-04 (×11): qty 1

## 2022-02-04 MED ORDER — SODIUM CHLORIDE 0.9% IV SOLUTION: Freq: Once | INTRAVENOUS | Status: AC

## 2022-02-04 MED ORDER — TRAZODONE HCL 50 MG PO TABS
75.0000 mg | ORAL_TABLET | Freq: Every day | ORAL | Status: DC
  Administered 2022-02-04 – 2022-02-10 (×7): 75 mg via ORAL
  Filled 2022-02-04 (×8): qty 2

## 2022-02-04 MED ORDER — POTASSIUM CHLORIDE CRYS ER 20 MEQ PO TBCR
20.0000 meq | EXTENDED_RELEASE_TABLET | Freq: Once | ORAL | Status: AC
  Administered 2022-02-04: 20 meq via ORAL
  Filled 2022-02-04: qty 1

## 2022-02-04 MED ORDER — FUROSEMIDE 40 MG PO TABS
40.0000 mg | ORAL_TABLET | Freq: Once | ORAL | Status: AC
  Administered 2022-02-04: 40 mg via ORAL
  Filled 2022-02-04: qty 1

## 2022-02-04 MED ORDER — RANOLAZINE ER 500 MG PO TB12
500.0000 mg | ORAL_TABLET | Freq: Two times a day (BID) | ORAL | Status: DC
  Administered 2022-02-04 – 2022-02-19 (×31): 500 mg via ORAL
  Filled 2022-02-04 (×31): qty 1

## 2022-02-04 MED ORDER — FUROSEMIDE 20 MG PO TABS: 10.0000 mg | ORAL_TABLET | Freq: Every day | ORAL | Status: DC

## 2022-02-04 NOTE — Progress Notes (Signed)
Discussed patient with cardiology fellow. He was unable to see EKG too and recommended having it texted for evaluation. He felt as patient pain free and stable to monitor. Also recommended lasix 40 mg due to fluid overload which could be contributing to symptoms. Charge nurse contacted to text both EKGs to Dr. Wilmon Pali at [phone] for evaluation

## 2022-02-04 NOTE — Progress Notes (Signed)
Physical Therapy Session Note  Patient Details  Name: Lauren [name] MRN: [id number] Date of Birth: [birth year]  Today's Date: 02/04/2022 PT Individual Time: 0901-1017 PT Individual Time Calculation (min): 76 min   Short Term Goals: Week 1:  PT Short Term Goal 1 (Week 1): Pt will perform bed mobility with increased attn to LUE and consistent MinA. PT Short Term Goal 2 (Week 1): pt will perform sit<>stand with CGA and improved midline positioning. PT Short Term Goal 3 (Week 1): Pt will perform stand pivot transfers with consistent minA. PT Short Term Goal 4 (Week 1): Pt will initiate gait training using LRAD. PT Short Term Goal 5 (Week 1): Pt will initiate stair training.  Skilled Therapeutic Interventions/Progress Updates:      Therapy Documentation Precautions:  Precautions Precautions: Fall Precaution Comments: L inattention, R gaze preference, L hemiplegia, no dressing to L foot - wear post-op shoe OOB Required Braces or Orthoses: Other Brace Other Brace: dressing on L foot Restrictions Weight Bearing Restrictions: No  Pt received seated in w/c at bedside with spouse present and pt agreeable to PT session. PT dependently donned ted hose to right LE and short stretch ace wrap to L LE. Vitals assessed BP 96/52 (66), ;pulse 84 , and O2 100%. Pt transported by w/c to main gym for time management and energy conservation.. Pt reaquires min A with sit to stand with no AD.  Vitals assessed following standing as pt unable to tolerate standing for BP assessment and recorded as 99/67 (78) with pulse of 90. Pt performed additional stand and vitals assessed in standing with BP 94/69 (78) with pulse of 96. Pt transported to parallel bars and participated in gait training with min A of 5 ft + 5 ft + 10 ft + 10 ft. Pt presents with narrow base of support and provided visual cue of line with tape and pt able to increase base of support with gait on final bout. Pt transported to room by w/c for time  management and energy conservation. Pt left seated in w/c at bedside with all needs in reach and alarm on.    Therapy/Group: Individual Therapy  Verl Dicker Verl Dicker PT, DPT  02/04/2022, 7:43 AM

## 2022-02-04 NOTE — Evaluation (Signed)
Recreational Therapy Assessment and Plan  Patient Details  Name: Lauren [name] MRN: [id number] Date of Birth: February 22, 1981 Today's Date: 02/04/2022  Rehab Potential:  Good ELOS:   d/c 10/20  Assessment Hospital Problem: Principal Problem:   Acute right MCA stroke [facility])     Past Medical History:      Past Medical History:  Diagnosis Date   Anemia     CAD (coronary artery disease)      a. s/p cath in 03/2014 showing 30% mid-LAD, moderate to severe disease along small D1, patent LCx, moderate to severe distal OM2 stenosis and moderate diffuse diease along RCA not amenable to PCI   CHF (congestive heart failure) ([place])      a. EF 55-60% in 12/2019 b. EF at 35-40% by echo in 05/2020   CKD (chronic kidney disease) stage 4, GFR 15-29 ml/min (HCC)     Diabetes mellitus without complication ([place])     Myocardial infarction ([place])     Neuropathy     Stroke [facility])      Past Surgical History:       Past Surgical History:  Procedure Laterality Date   AMPUTATION Left 09/02/2021    Procedure: AMPUTATION RAY;  Surgeon: Criselda Peaches, DPM;  Location: [place];  Service: Podiatry;  Laterality: Left;  sagittal saw, 3L bag saline & Pulse   Cardiac catherization       CHOLECYSTECTOMY       I & D EXTREMITY Left 08/30/2021    Procedure: IRRIGATION AND DEBRIDEMENT WITH BONE BIOPSY;  Surgeon: [name], DPM;  Location: [place];  Service: Podiatry;  Laterality: Left;   IR ANGIO VERTEBRAL SEL SUBCLAVIAN INNOMINATE UNI L MOD SED   01/18/2022   IR CT HEAD LTD   01/08/2022   IR INTRA CRAN STENT   01/08/2022   IR PERCUTANEOUS ART THROMBECTOMY/INFUSION INTRACRANIAL INC DIAG ANGIO   01/08/2022   IRRIGATION AND DEBRIDEMENT FOOT Left 09/04/2021    Procedure: IRRIGATION AND DEBRIDEMENT FOOT AND CLOSURE;  Surgeon: Criselda Peaches, DPM;  Location: [place];  Service: Podiatry;  Laterality: Left;   RADIOLOGY WITH ANESTHESIA N/A 01/08/2022    Procedure: IR WITH ANESTHESIA;  Surgeon: Radiologist, Medication, MD;  Location: [place];  Service: Radiology;  Laterality: N/A;   THROMBECTOMY BRACHIAL ARTERY Right 11/18/2021    Procedure: RIGHT BRACHIAL, RADIAL, & ULNAR ARTERY THROMBECTOMY.;  Surgeon: Marty Heck, MD;  Location[facility];  Service: Vascular;  Laterality: Right;   TUBAL LIGATION          Assessment & Plan Clinical Impression: Patient is a 41 y.o. female who presented to the emergency department on 05/10/2021 with unsteady gait left-sided facial droop left arm weakness with drift and slurred speech.  Code stroke called.  She was taking Eliquis secondary to recent upper extremity thromboembolus and history of coagulopathy.  Neurology consulted.  Imaging revealed age-indeterminate occlusion of proximal right ICA, V3 segment of right vertebral artery, left paraclinoid ICA stenosis.  She underwent left vertebral arteriogram with right common carotid arteriogram by Dr. Estanislado Pandy. Complete revascularization of the internal carotid artery achieved.  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  She was loaded with aspirin, Brilinta via orogastric tube.  She was left intubated.     Nephrology was consulted for renal failure on 9/10. Cardiology consulted and recent echo revealed some decrease in EF to approximately 30 to 35%.  Anemia noted and she was transfused 1 unit of  packed red blood cells.  Elevated troponins with unremarkable EKG and hypertension therefore treated with nitroglycerin and heparin infusion.  Infectious disease consultation obtained on 9/14 due to prior left foot osteomyelitis.  Blood cultures negative.  Augmentin continued.  Hematology consulted on 9/14 for additional anticoagulation recommendations.  Podiatry consulted regarding bone biopsy of the left foot.  Augmentin discontinued.  Bone biopsy of the fourth metatarsal left foot was sent for culture and pathology performed at the bedside by Dr. Amalia Hailey 9/16.  Broad-spectrum antibiotics were  adjusted.  She was extubated on 9/18.  Transferred to hospitalist service.  She has had fairly stable hemoglobin since transfusion.  Heparin infusion discontinued on 9/21 and she was started on Lovenox 80 mg twice daily.  Dosing was decreased to every 24 hours on 9/24.  Hypercoagulable work-up negative with some labs pending.  Hematology recommends outpatient referral. Bone culture negative now off antibiotics.   She underwent SPL evaluation and has been upgraded to dysphagia 3 diet.  BUN and creatinine on downward trend, unknown baseline.  Leukocytosis trending downward.  She has been afebrile over the past week. The patient requires inpatient physical medicine and rehabilitation evaluations and treatment secondary to dysfunction due to right MCA stroke.   Her past medical history significant for CAD medically managed, diabetes mellitus, stage IV kidney disease, prior CVA and coagulopathy.  She required right upper extremity embolectomy 11/18/2021.  She was placed on Eliquis.  She also has a history of left foot osteomyelitis and is status post multiple debridements and partial left fifth ray resection by podiatry.  Also has history of congestive heart failure and tobacco abuse.  Patient transferred to CIR on 01/28/2022.    Pt presents with decreased activity tolerance, decreased functional mobility, decreased balance, decreased vision,decreased midline orientation, left inattention, decreased attention, decreased awareness, decreased problem solving, decreased safety awareness, feelings of stress Limiting pt's independence with leisure/community pursuits.  Met with pt today to discuss TR services including leisure education, activity analysis/modifications and stress management.  Also discussed the importance of social, emotional, spiritual health in addition to physical health and their effects on overall health and wellness.  Pt stated understanding.  PT enjoys wood burning, paining, diamond art, spending  time with her dog.   Plan  Min 1 TR session >20 minutes during LOS  Recommendations for other services: None   Discharge Criteria: Patient will be discharged from TR if patient refuses treatment 3 consecutive times without medical reason.  If treatment goals not met, if there is a change in medical status, if patient makes no progress towards goals or if patient is discharged from hospital.  The above assessment, treatment plan, treatment alternatives and goals were discussed and mutually agreed upon: by patient  [place] 02/04/2022, 2:26 PM

## 2022-02-04 NOTE — Progress Notes (Signed)
Occupational Therapy Session Note  Patient Details  Name: Lauren [name] MRN: [id number] Date of Birth: Aug 17, 1980  Today's Date: 02/04/2022 OT Individual Time: 1415-1530 OT Individual Time Calculation (min): 75 min    Short Term Goals: Week 1:  OT Short Term Goal 1 (Week 1): Pt to be Mod A for UB dressing OT Short Term Goal 2 (Week 1): Pt to be Mod A for LB dressing OT Short Term Goal 3 (Week 1): Pt to be Mod A for toileting OT Short Term Goal 4 (Week 1): Pt to demonstrate improved attention to L side with no more than 3 verbal cues OT Short Term Goal 5 (Week 1): Pt to perform functional transfers with Min A  Skilled Therapeutic Interventions/Progress Updates:  Pt up in w/c upon OT arrival for session. Transported to main gym for L NMRE with use of SaeboStim One (see parameters below) applied to L wrist extensors with limited results with demonstration of AAROM. Used functional stimulation with cup to mouth and foam block squeezes with minimal response. Use of mirror box for trial AAROM also with trace movement. OT transported pt back to room and training with t]pt's daoughter for functional integration with L UE, scanning and awareness strategies as well as initiating some UE HEP for carryover including PNF strategies, foam block use and scap retraction and shoulder elevation exercises. RO and scanning targets with mod vc's overall. Pt left in the care of nursing administering meds and chair alarm active with needs within reach.   SaeboStim One:  Pt with no adverse skin reactions.  330 pulse width 35 Hz pulse rate On 8 sec/ off 8 sec Ramp up/ down 2 sec Symmetrical Biphasic wave form  Max intensity 133mA at 500 Ohm load  Therapy Documentation Precautions:  Precautions Precautions: Fall Precaution Comments: L inattention, R gaze preference, L hemiplegia, no dressing to L foot - wear post-op shoe OOB Required Braces or Orthoses: Other Brace Other Brace: dressing on L  foot Restrictions Weight Bearing Restrictions: No    Therapy/Group: Individual Therapy  [name] 02/04/2022, 7:57 AM

## 2022-02-04 NOTE — Progress Notes (Signed)
PROGRESS NOTE   Subjective/Complaints:  Pt reports she doesn't know when had LBM but doesn't feel constipated.  Ate 100% tray  Per PA note and d/w PA, pt had more orthostatic hypotension again today-  Doesn't remember seeing anyone by a Dr Guy Sandifer yesterday from podiatry.  His note is incomplete, so not sure of plan.   Said doesn't want to wake up again today.   ROS:   Pt denies SOB, abd pain, CP, N/V/C/D, and vision changes   Objective:   DG CHEST PORT 1 VIEW  Result Date: 02/03/2022 CLINICAL DATA:  Chest pain EXAM: PORTABLE CHEST 1 VIEW COMPARISON:  01/24/2022 FINDINGS: Single frontal view of the chest demonstrates stable mild enlargement of the cardiac silhouette. Interval increase in central vascular congestion and diffuse interstitial prominence, with patchy bibasilar airspace disease and has developed in the interim. No effusion or pneumothorax. IMPRESSION: 1. Findings most consistent with volume overload and mild pulmonary edema. Electronically Signed   By: Randa Ngo M.D.   On: 02/03/2022 23:50   Recent Labs    02/04/22 0058 02/04/22 0529  WBC 9.1 9.5  HGB 7.0* 7.8*  HCT 21.6* 24.5*  PLT 430* 462*   Recent Labs    02/04/22 0058 02/04/22 0529  NA 135 135  K 3.9 4.0  CL 108 107  CO2 21* 20*  GLUCOSE 201* 160*  BUN 27* 27*  CREATININE 2.26* 2.12*  CALCIUM 8.1* 8.3*    Intake/Output Summary (Last 24 hours) at 02/04/2022 0838 Last data filed at 02/04/2022 0249 Gross per 24 hour  Intake 716 ml  Output 400 ml  Net 316 ml         Physical Exam: Vital Signs Blood pressure 124/76, pulse 98, temperature 98 F (36.7 C), temperature source Oral, resp. rate 18, weight 84.8 kg, last menstrual period 11/01/2021, SpO2 100 %.      General: awake, alert, appropriate, woke from sleep- said doesn't want to wake up;  NAD HENT: R  gaze preference; oropharynx moist CV: borderline tachycardic rate; no  JVD Pulmonary: CTA B/L; no W/R/R- good air movement GI: soft, NT, ND, (+)BS Psychiatric: flat, frustrated with being woken up Neurological: sleepy but answering questions- poor memory Skin: + L 5th toe amputation; wound site open with mild serous drainage, clean wound bed, no erythema or warmth.  Seen wrapped up in ACE wrap- C/D/I   PE from prior encounter: MSK:      No apparent deformity.      Strength:                RUE: 5/5 SA, 5/5 EF, 5/5 EE, 5/5 WE, 5/5 FF, 5/5 FA                 LUE: flaccid, 0/5 throughout                RLE: 5/5 HF, 5/5 KE, 5/5 DF, 5/5 EHL, 5/5 PF                 LLE:  3/5 HF, 2/5 KE, 0/5 DF, 0/5 EHL, 1/5 PF    Neurologic exam:  Cognition: AAO to person, place, time and event.  Language:  Fluent, No substitutions or neoglisms.Moderate dysarthria Mood: Pleasant affect, appropriate mood.  Sensation: To light touch intact in BL UEs and LEs  CN: Left facial droop   Assessment/Plan: 1. Functional deficits which require 3+ hours per day of interdisciplinary therapy in a comprehensive inpatient rehab setting. Physiatrist is providing close team supervision and 24 hour management of active medical problems listed below. Physiatrist and rehab team continue to assess barriers to discharge/monitor patient progress toward functional and medical goals  Care Tool:  Bathing    Body parts bathed by patient: Right arm, Left arm, Chest, Abdomen, Buttocks, Right upper leg, Left upper leg, Face   Body parts bathed by helper: Front perineal area, Right lower leg, Left lower leg     Bathing assist Assist Level: Moderate Assistance - Patient 50 - 74%     Upper Body Dressing/Undressing Upper body dressing   What is the patient wearing?: Bra, Pull over shirt    Upper body assist Assist Level: Maximal Assistance - Patient 25 - 49%    Lower Body Dressing/Undressing Lower body dressing      What is the patient wearing?: Incontinence brief, Pants     Lower body  assist Assist for lower body dressing: Maximal Assistance - Patient 25 - 49%     Toileting Toileting Toileting Activity did not occur (Clothing management and hygiene only): N/A (no void or bm)  Toileting assist Assist for toileting: Maximal Assistance - Patient 25 - 49%     Transfers Chair/bed transfer  Transfers assist     Chair/bed transfer assist level: Moderate Assistance - Patient 50 - 74%     Locomotion Ambulation   Ambulation assist   Ambulation activity did not occur: Safety/medical concerns  Assist level: Minimal Assistance - Patient > 75% Assistive device: Parallel bars Max distance: 30 ft   Walk 10 feet activity   Assist  Walk 10 feet activity did not occur: Safety/medical concerns  Assist level: Minimal Assistance - Patient > 75% Assistive device: Parallel bars   Walk 50 feet activity   Assist Walk 50 feet with 2 turns activity did not occur: Safety/medical concerns         Walk 150 feet activity   Assist Walk 150 feet activity did not occur: Safety/medical concerns         Walk 10 feet on uneven surface  activity   Assist Walk 10 feet on uneven surfaces activity did not occur: Safety/medical concerns         Wheelchair     Assist Is the patient using a wheelchair?: Yes Type of Wheelchair: Manual    Wheelchair assist level: Total Assistance - Patient < 25% Max wheelchair distance: 300 ft    Wheelchair 50 feet with 2 turns activity    Assist        Assist Level: Total Assistance - Patient < 25%   Wheelchair 150 feet activity     Assist      Assist Level: Total Assistance - Patient < 25%   Blood pressure 124/76, pulse 98, temperature 98 F (36.7 C), temperature source Oral, resp. rate 18, weight 84.8 kg, last menstrual period 11/01/2021, SpO2 100 %.  Medical Problem List and Plan: 1. 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             -patient may shower with lines and foot wound covered             -  ELOS/Goals: 14-21 days  WBAT On LLE D/c 10/20 Con't CIR PT and OT and SLP- mod-max A for cognition, of note.   2.  Antithrombotics: -DVT/anticoagulation:  Pharmaceutical: Lovenox             -antiplatelet therapy: Brilinta 3. Pain Management: Tylenol, oxycodone as needed 4. Mood/Behavior/Sleep: LCSW to evaluate and provide support             -continue melatonin 3 mg q HS             -antipsychotic agents: n/a 5. Neuropsych/cognition: This patient is capable of making decisions on her own behalf. 6. 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 Fluids/Electrolytes/Nutrition: Strict Is and Os and follow-up chemistries             -dys 3/thin liquids; SLP eval             -repeat mag/phos tomorrow 8: Right ICA dissection with stent placement: on Brilinta 9: Hypertension: monitor             -continue hydralazine 12.5 mg TID             -continue isosorbide 20 mg BID             -continue Lopressor 37.5 mg BID  10/2- BP is controlled- con't regimen  10/3- had BP of 80s/50s yesterday- hydralazine stopped- is better this AM- will see how does in therapy with standing- concern will drop  10/4- will decrease Metoprolol to 25 mg BID- not change to succinate, since won't have ability to change meds as often.   10/5- BP overnight 110s/120s supine- will see how tolerates therapy today.  10: Hyperlipidemia: continue Lipitor 40 mg daily 11: Coagulopathy s/p RUE embolectomy 10/2021: Eliquis PTA now on Lovenox; f                -follow-up hematology as outpt 12: COPD exacerbation: hypoxia resolved; continue bronchodilators/nebs 13: Chronic diastolic and systolic heart failure:             -strict Is and Os and daily weight             -no diuretics currently             -see meds above   Filed  Weights   02/01/22 0518 02/02/22 0500 02/03/22 0500  Weight: 84.9 kg 84.7 kg 84.8 kg   10/5- will start Lasix 10 mg daily due to vascualar congestion on CXR 14: Hypothyroidism: continue Synthroid 125 mcg daily; follow-up TSH 15: 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   10/3- ate breakfast tray this AM- BG's running from 56 (low yesterday) to 312 yesterday due to foods form home- will ask DM coordinator to help try and manage.   10/4- CBG's mid 100s to mid 200s- a little better in last 24  hours- no hypoglycemia 17: Class 2 obesity: dietary counseling 18: Tobacco abuse: cessation counseling; continue nicotine patch 19: AKI atop CKD IV: trend BMP  -9/29 Cr improved to 1.88  10/2- Down to 1.59 and BUN 24- will push fluids and recheck in AM  10/3- Cr 1.64 and BUN 24- basically stable- will recheck Thursday-   10/4- ordered BMP for tomorrow  10/5- Cr 2.12 up from 1.64- but required Lasix 40 mg PO x1- actually will start lasix 10 mg daily due to pulm/vascular congestion-  20: CAD: stable; treated medically; Dr. Harl Bowie 21: Left foot osteomyelitis s/p ray amputation [name] -recent bone biopsy; cultures neg - Husband to bring in ?CAM boot for weightbearing, protection - Remove IJ order - 9/30 Noted surgical site dehiscence with PT ambulation, was not wearing orthotic. Podiatry consulted, imaging showing mildly angulated fracture of the distal fourth metatarsal (likely iatrogenic from prior amputation)               - Orders for betadyne dressing daily               - NWB LLE               - Appreciate podiatry recs, to discuss 4th  MT fx as group next week  10/2- Podiatry says once has CAM boot to be worn- either from home or for [name] to order- d/w PA-  and will check in to it. Supposedly had a dehiscence but podiatry to see again today.   10/3- podiatry said they'd come back for possible bedside debridement- can WBAT now with CAM boot 22: Leukocytosis: trending down; follow-up CBC  -9/29 WBC 9.5 today, improved 23. Dysphagia  -Advanced to heart healthy/carb modified/ thin liquids            - 9/30 - family requesting diet liberalization as patient primarily eating foods brought in from outside d/t hospital food bland; carb modified diet unable to be ordered with less restrictive parameters. Dietary consulted.   24. RSV  10/2- will take off Droplet precautions- no more Sx's.  25. Chest pain  10/5- CP last night- required NTG x2- EKG had no changes- troponins elevated probably due to pulmonary congestion which was also found- will transfuse 1 unit to help CP; also restart her home Protonix for hx of reflux.  26, ABLA  10/5- will transfuse 1 unit- think the Hb 7.8 that came back 5 hours later is likely due to hemoconcentration, and isn't real, since her Hb has been trending down for days. On Lovenox and Brillenta- check Fecal occult for blood loss.  27. Insomnia  10/5- will increase trazodone to 75 mg QHS per pt request   I spent a total of  53  minutes on total care today- >50% coordination of care- due to rapid response overnight, d/w PA prolonged about transfusion need, nursing about issues overnight- and saw pt x2 to discuss medical issues; also independently reviewed CXR.    LOS: 7 days A FACE TO FACE EVALUATION WAS PERFORMED  Sakiya Stepka 02/04/2022, 8:38 AM

## 2022-02-04 NOTE — Inpatient Diabetes Management (Signed)
Inpatient Diabetes Program Recommendations  AACE/ADA: New Consensus Statement on Inpatient Glycemic Control (2015)  Target Ranges:  Prepandial:   less than 140 mg/dL      Peak postprandial:   less than 180 mg/dL (1-2 hours)      Critically ill patients:  140 - 180 mg/dL   Lab Results  Component Value Date   GLUCAP 157 (H) 02/04/2022   HGBA1C 8.0 (H) 01/09/2022    Latest Reference Range & Units 02/03/22 10:23 02/03/22 11:36 02/03/22 15:07 02/03/22 16:42 02/03/22 20:59 02/04/22 05:53  Glucose-Capillary 70 - 99 mg/dL 199 (H) 153 (H) 215 (H) 264 (H) 210 (H) 157 (H)  (H): Data is abnormally high Review of Glycemic Control  DM1(does not make insulin)   Outpatient Diabetes medications:  Per Whitney with [facility] [facility]-last settings on 9/10  Basal rate 12a-12p = 0.9 units/hr--total 24 hr basal 21.6 units Insulin: Carb ratio= 1 unit/10 g  Correction Sensitivity= 38 mg/dL  Target BG= 120  Active time= 4 hrs    Current orders for Inpatient glycemic control:  Levemir 25 units BID Novolog 0-20 units tid Novolog 2 units tid meal coverage  Inpatient Diabetes Program Recommendations:  Noted that postprandial blood sugars have been greater than 200 mg/dl. Recommend increasing Novolog meal coverage to 4 units TID with meals if eating at least 50% of meal.  Harvel Ricks RN BSN CDE Diabetes Coordinator Pager: [phone]  8am-5pm

## 2022-02-04 NOTE — Progress Notes (Signed)
Speech Language Pathology Daily Session Note  Patient Details  Name: Lauren [name] MRN: [id number] Date of Birth: 24-Jan-1981  Today's Date: 02/04/2022 SLP Individual Time: 1102-1130 SLP Individual Time Calculation (min): 28 min  Short Term Goals: Week 1: SLP Short Term Goal 1 (Week 1): Pt will demonstrate tolerance of regular diet textures with thin liquids with minimal to no s/sx concerning for airway compromise when given Sup A for implementation of safe swallowing strategies (small + single bites/sips, slow rate). SLP Short Term Goal 2 (Week 1): Pt will demonstrate increased awareness of current deficits s/p CVA by naming 3 physical and/or 3 cognitive deficits given Min A verbal cues. SLP Short Term Goal 3 (Week 1): Pt will recall 3 pieces of novel information following 5 minute delay given Min A verbal cues. SLP Short Term Goal 4 (Week 1): Pt will complete basic visual and verbal problem-solving tasks r/t iADL and ADL completion given Min A verbal cues. SLP Short Term Goal 5 (Week 1): Pt will utilize visual scanning strategies to complete tasks targeting L inattention with 100% completion given Mod A verbal and visual cues.  Skilled Therapeutic Interventions: Skilled treatment session focused on cognitive goals. Upon arrival, patient was awake in the wheelchair. Patient reported fatigue but appeared brighter and engaged more socially with SLP compared to previous sessions. SLP facilitated session by providing a visual anchor for patient to utilize during a basic money management task. Patient required only Min verbal cues for problem solving and left visual scanning throughout task. Patient also reported she noticed the improvement in visual attention with the use of the visual anchor. Patient left upright in wheelchair with alarm on and all needs within reach. Continue with current plan of care.      Pain Pain Assessment Pain Scale: 0-10 Pain Score: 2  Pain Type: Acute pain Pain  Location: Head Pain Orientation: Anterior;Mid Pain Descriptors / Indicators: Aching Pain Frequency: Occasional Pain Onset: Gradual Pain Intervention(s): Medication (See eMAR)  Therapy/Group: Individual Therapy  Devean Skoczylas, [place] 02/04/2022, 3:16 PM

## 2022-02-04 NOTE — Plan of Care (Signed)
EKG is seen and reviewed, no evidence of STE. Pt is CP free at this time sp ntg x2. Cxr w evidence of PV congestion. Consider lasix 40 mg iv once.

## 2022-02-04 NOTE — Significant Event (Signed)
Rapid Response Event Note   Reason for Call :  Chest pain  Per RN, pt began to c/o 6/10 chest. EKG, PCXR, ntg SL x 2, and trop were ordered.   Initial Focused Assessment:  Pt lying in bed with eyes closed, in no visible distress. Pt c/o mid sternal CP/pressure 6/10. This is after SL ntg x 2. Pt says she had heartburn earlier in the day and this chest pain feels different that chest pain from earlier. Pt denies SOB. Lungs clear t/o. Skin warm and dry.   HR-98, BP-111/64, RR-18, SpO2-100% on 2L [place].   Interventions:  EKG-NSR, nonspecific ST and T wave abnormality>EKG texted to cards fellow per Jeannene Patella, PA's request NTG SL x2 PCXR-Findings most consistent with volume overload and mild pulmonary edema. Trop-174 Lasix-40mg  PO Plan of Care:  Per pt, pain has "eased off" after sl ntg. Give lasix and monitor results. Call RRT if further assistance needed.   Event Summary:   MD NotifiedJeannene Patella, PA Call [place] End Time: [email or web address], RN

## 2022-02-04 NOTE — Progress Notes (Signed)
Occupational Therapy Session Note  Patient Details  Name: Lauren [name] MRN: [id number] Date of Birth: Dec 19, 1980  Today's Date: 02/04/2022 OT Individual Time: 1130-1155 OT Individual Time Calculation (min): 25 min    Short Term Goals: Week 1:  OT Short Term Goal 1 (Week 1): Pt to be Mod A for UB dressing OT Short Term Goal 2 (Week 1): Pt to be Mod A for LB dressing OT Short Term Goal 3 (Week 1): Pt to be Mod A for toileting OT Short Term Goal 4 (Week 1): Pt to demonstrate improved attention to L side with no more than 3 verbal cues OT Short Term Goal 5 (Week 1): Pt to perform functional transfers with Min A  Skilled Therapeutic Interventions/Progress Updates:    Pt resting in w/c upon arrival. OT intervention with focus on attention to Lt, LUE NMR, and LUE retrograde massage. See below for LUE NMR. Pt required min verbal cues to attend to her left and avert gaze to OTA. Retrograde massage for edema mgmt. Pt remained in w/c. Belt alarm activated. Half lap tray in place. Husband present.   Therapy Documentation Precautions:  Precautions Precautions: Fall Precaution Comments: L inattention, R gaze preference, L hemiplegia, no dressing to L foot - wear post-op shoe OOB Required Braces or Orthoses: Other Brace Other Brace: dressing on L foot Restrictions Weight Bearing Restrictions: No    Pain: Pain Assessment Pain Scale: 0-10 Pain Score: 6  Pain Type: Acute pain Pain Location: Head Pain Orientation: Anterior;Mid Pain Descriptors / Indicators: Aching Pain Frequency: Occasional Pain Onset: Gradual Pain Intervention(s): Medication (See eMAR)   Other Treatments: Treatments Neuromuscular Facilitation: Left;Upper Extremity;Forced use;Activity to increase motor control Weight Bearing Technique Weight Bearing Technique: Yes LUE Weight Bearing Technique: Forearm seated Response to Weight Bearing Technique: decreased internal rotation tone and increased shoulder flexion with trace  elbow extension   Therapy/Group: Individual Therapy  Leroy Libman 02/04/2022, 11:58 AM

## 2022-02-04 NOTE — Progress Notes (Signed)
Patient with chest pressure. EKG and trops ordered due to CAD hx. NTG ordered and given. Checked back. EKG not transmitted and patient still having CP. Advised to give another NTG as BP stable and also do another EKG and have rapid RN evaluate EKG and assist with transmission. CXR also ordered due to concerns of overload causing CP as last one showed some fluid. No hypoxia reported but oxygen placed chart reviewed--patient with positive trops and CAD treated medically at this time. Called back--CXR showed fluid overload--chest pain almost resolved  per Rapid and patient without pulmonary symptoms/lungs sound clear. Repeat EKG looked ok. And discussed that will await troponin results-->if +will contact fellow or follow up with cards in am. RN aware to call me with results of trops.

## 2022-02-04 NOTE — Progress Notes (Addendum)
Rounding Note    Patient Name: Lauren [name] Date of Encounter: 02/04/2022  [place] Cardiologist: Carlyle Dolly, MD   Subjective   Patient had an episode of chest pain earlier this morning.  Given nitroglycerin.  Chest x-ray showed volume overload.  EKG was nonischemic.  High-sensitivity troponin 181 which is flat compared to her other troponins of 127>> 132>> 133 last month and then 174 and 181 this morning.  No further chest pain. Inpatient Medications    Scheduled Meds:  sodium chloride   Intravenous Once   atorvastatin  40 mg Per Tube Daily   cadexomer iodine   Topical Daily   enoxaparin (LOVENOX) injection  120 mg Subcutaneous Q24H   furosemide  10 mg Oral Daily   insulin aspart  0-20 Units Subcutaneous TID WC   insulin aspart  2 Units Subcutaneous TID WC   insulin detemir  25 Units Subcutaneous BID   isosorbide mononitrate  20 mg Oral BID   levothyroxine  125 mcg Oral Q0600   melatonin  3 mg Oral QHS   metoprolol tartrate  25 mg Oral BID   nicotine  21 mg Transdermal Daily   pantoprazole  40 mg Oral Daily   revefenacin  175 mcg Nebulization Daily   sodium chloride flush  10-40 mL Intracatheter Q12H   sodium chloride flush  3 mL Intravenous Q12H   ticagrelor  90 mg Oral BID   Or   ticagrelor  90 mg Per Tube BID   traZODone  75 mg Oral QHS   Continuous Infusions:  sodium chloride     PRN Meds: sodium chloride, acetaminophen, alum & mag hydroxide-simeth, diphenhydrAMINE, guaiFENesin-dextromethorphan, influenza vac split quadrivalent PF, ipratropium-albuterol, methocarbamol, nitroGLYCERIN, oxyCODONE, pneumococcal 20-valent conjugate vaccine, prochlorperazine **OR** prochlorperazine **OR** prochlorperazine, senna-docusate, sodium chloride flush, sodium chloride flush, sodium phosphate, sorbitol   Vital Signs    Vitals:   02/03/22 2247 02/03/22 2314 02/03/22 2332 02/04/22 0250  BP: 100/64 110/74 111/64 124/76  Pulse: 95 100 98 98  Resp: 18  18 18    Temp: 98.3 F (36.8 C)   98 F (36.7 C)  TempSrc: Oral   Oral  SpO2: 99% 98% 100% 100%  Weight:        Intake/Output Summary (Last 24 hours) at 02/04/2022 0846 Last data filed at 02/04/2022 0249 Gross per 24 hour  Intake 716 ml  Output 400 ml  Net 316 ml       02/03/2022    5:00 AM 02/02/2022    5:00 AM 02/01/2022    5:18 AM  Last 3 Weights  Weight (lbs) 186 lb 15.2 oz 186 lb 11.7 oz 187 lb 2.7 oz  Weight (kg) 84.8 kg 84.7 kg 84.9 kg      Telemetry    N/A - Personally Reviewed  ECG    EKG this morning showed normal sinus rhythm with lateral T wave inversions similar to EKG 01/15/2022- Personally Reviewed  Physical Exam   GEN: Well nourished, well developed in no acute distress HEENT: Normal NECK: No JVD; No carotid bruits LYMPHATICS: No lymphadenopathy CARDIAC:RRR, no murmurs, rubs, gallops RESPIRATORY: Crackles at left base ABDOMEN: Soft, non-tender, non-distended MUSCULOSKELETAL:  No edema; No deformity  SKIN: Warm and dry NEUROLOGIC:  Alert and oriented x 3 PSYCHIATRIC:  Normal affect   Labs    High Sensitivity Troponin:   Recent Labs  Lab 01/23/22 1500 01/23/22 2250 01/24/22 0050 02/03/22 2317 02/04/22 0058  TROPONINIHS 127* 132* 133* 174* 181*  Chemistry Recent Labs  Lab 01/29/22 0636 02/01/22 0611 02/02/22 0506 02/04/22 0058 02/04/22 0529  NA 137   < > 138 135 135  K 4.0   < > 4.0 3.9 4.0  CL 106   < > 111 108 107  CO2 26   < > 22 21* 20*  GLUCOSE 312*   < > 241* 201* 160*  BUN 40*   < > 24* 27* 27*  CREATININE 1.88*   < > 1.64* 2.26* 2.12*  CALCIUM 8.4*   < > 8.5* 8.1* 8.3*  MG 2.4  --   --   --   --   PROT 5.7*  --  5.4*  --   --   ALBUMIN 2.4*  --  2.4*  --   --   AST 32  --  24  --   --   ALT 32  --  28  --   --   ALKPHOS 98  --  89  --   --   BILITOT 0.3  --  0.6  --   --   GFRNONAA 34*   < > 41* 28* 30*  ANIONGAP 5   < > 5 6 8    < > = values in this interval not displayed.     Lipids No results for input(s): "CHOL",  "TRIG", "HDL", "LABVLDL", "LDLCALC", "CHOLHDL" in the last 168 hours.  Hematology Recent Labs  Lab 02/02/22 0506 02/04/22 0058 02/04/22 0529  WBC 9.2 9.1 9.5  RBC 2.80* 2.49* 2.81*  HGB 7.7* 7.0* 7.8*  HCT 24.1* 21.6* 24.5*  MCV 86.1 86.7 87.2  MCH 27.5 28.1 27.8  MCHC 32.0 32.4 31.8  RDW 16.6* 16.6* 16.6*  PLT 513* 430* 462*    Thyroid  No results for input(s): "TSH", "FREET4" in the last 168 hours.   BNPNo results for input(s): "BNP", "PROBNP" in the last 168 hours.  DDimer No results for input(s): "DDIMER" in the last 168 hours.   Radiology    DG CHEST PORT 1 VIEW  Result Date: 02/03/2022 CLINICAL DATA:  Chest pain EXAM: PORTABLE CHEST 1 VIEW COMPARISON:  01/24/2022 FINDINGS: Single frontal view of the chest demonstrates stable mild enlargement of the cardiac silhouette. Interval increase in central vascular congestion and diffuse interstitial prominence, with patchy bibasilar airspace disease and has developed in the interim. No effusion or pneumothorax. IMPRESSION: 1. Findings most consistent with volume overload and mild pulmonary edema. Electronically Signed   By: Randa Ngo M.D.   On: 02/03/2022 23:50    Cardiac Studies   Echo from 01/14/22:   1. Left ventricular ejection fraction, by estimation, is 35 to 40%. Left  ventricular ejection fraction by 2D MOD biplane is 37.1 %. The left  ventricle has moderately decreased function. The left ventricle  demonstrates regional wall motion abnormalities  (see scoring diagram/findings for description). There is mild left  ventricular hypertrophy. Left ventricular diastolic parameters are  consistent with Grade II diastolic dysfunction (pseudonormalization).  Elevated left ventricular end-diastolic pressure.  The E/e' is 56. Wall motion abnormality suggestive of LAD territory  infarct or less likely Takatsubo cardiomyopathy.   2. Right ventricular systolic function is normal. The right ventricular  size is normal.  Tricuspid regurgitation signal is inadequate for assessing  PA pressure.   3. The mitral valve is abnormal. Trivial mitral valve regurgitation.   4. The inferior vena cava is normal in size with <50% respiratory  variability, suggesting right atrial pressure of 8 mmHg.   5.  The aortic valve is tricuspid. Aortic valve regurgitation is not  visualized.   Comparison(s): Changes from prior study are noted. 01/10/2022: LVEF 30-35%,  mid to apical ventricular wall thinning and severe hypokinesis to  akinesis.   Patient Profile     41 y.o. 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 She went immediately to IR for thrombectomy.  She suffered dysphagia after the CVA, declined PEG tube placement. Course was complicated by AKI. She also was on Lovenox PTA due to right brachial artery occlusion required thrombectomy 10/2021.  Patient is now discharged to CIR since 01/28/22.  Cardiology was consulted on 01/11/2022 for further evaluation of EKG changes and elevated troponin. Trop peaked 1610. Echo this admission shows LVEF of 30-30% with multiple wall motion abnormalities. She never had any chest pain. Given acute CVA and severe anemia with Hgb down to 6.5, she was recommended medical management of CAD with Brilinta 90mg  twice daily, Toprol-XL 75mg  daily, Imdur 20mg  twice daily and Lipitor 40mg  daily. Hospital course is c/w troponin elevation likely demand from anemia and dysphagia (refused PEG tube).  She suffered AKI on CKD IV, diuretics and GDMT was held for CHF  beside Toprol XL and Imdur.   Cardiology signed off 01/27/22. Cardiology is re-consulted for hypotension a few days ago and her medications were adjusted and metoprolol tartrate was changed to metoprolol succinate 25 mg daily and hydralazine was decreased to 12.5 mg twice daily.  She was felt not a candidate for ICD therapy as she was DNR.  Continued on Imdur 20 mg twice daily as well.  Cardiology is now called back to see because of development of chest pain overnight  Assessment & Plan     Hypotension - BP remains stable at 124/76 mmHg after adjustment of her cardiac meds. - Hydralazine was stopped and beta-blocker dose was decreased  CAD/chest pain - Patient has a history of CAD with moderate to severe disease on cardiac catheterization in 2015 (treated medically). She presented this admission with an acute stroke and was found to have elevated troponin. Hs troponin peaked at 3,869. Echo this admission shows LVEF of 30-30% with multiple wall motion abnormalities. - Trop elevation was felt likely due to type 2 demand ischemia. No plans for inpatient ischemic workup given due to acute stroke and severe anemia and advanced CKD as well as the fact the patient is DNR and  palliative care is following.  -Now status post episode of chest pain last night with no significant change in her troponin which has been mildly elevated but flat this admission and lower than last admission a few weeks ago  -EKG early this a.m. showed chronic T wave inversions in the lateral leads but no change from prior EKG a month ago  -Chest x-ray at this morning showed volume overload which likely was the reason for her chest discomfort -Is a poor candidate for cardiac cath at this time due to AKI on CKD serum creatinine 2.12 this morning, recent acute CVA and she is also DNR and palliative care is following -Continue Brilinta 90mg  twice daily,  isosorbide 20mg  twice daily and Lipitor 40mg  daily. -Her beta-blocker had  recently been decreased due to hypotension as well as stopping hydralazine - BP remains soft with a systolic blood pressure in the room of 95 mmHg - No good options for antianginal therapy given her soft BP - Cannot titrate beta-blocker or nitrates any further - Will try addition of Ranexa 500 mg twice daily - Palliative care has been involved last admission, patient is DNR.  - Not much more to add at this time and therefore recommend proceeding with hospice   Acute on chronic Combined CHF - Initial Echo on 9/10 showed LVEF of 30-35% with akinesis of the mid to apical septal LV segments, all apical segments, and apex as well as significant thinning of the mid to apical septal segments and apex likely representing scarring/infarct. 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  - Lasix PRN for CHF exacerbation  - Had chest pain and shortness of breath last night and chest x-ray consistent with volume overload>> given 40 mg of p.o. Lasix at 1 AM - She put out 400 cc and is still net positive - No significant change in weight over the past few days - GDMT: Lopressor 25 mg twice daily and isosorbide 20mg  daily twice daily - NO ACEi/ARB/ARNI /MRA or SGLT2Ii given advanced CKD  - Hydralazine was stopped a few days ago due to hypotension - She is DNR and palliative care is following no indication for ICD placement - I do not think her blood pressure will tolerate aggressive diuresis at this time  - Increase Lasix to 40 mg p.o. daily   Hyperlipidemia - Lipid panel this past admission: Total Cholesterol 85, Triglycerides 154, HDL 15, LDL 39. - on Lipitor 40mg  daily.   CKD stage IV - Baseline creatinine around 2.0 to 2.5.    Right Brachial Artery Occlusion S/p thrombectomy in 10/2021. - On Eliquis prior to admission, transitioned to Therapeutic Lovenox PTA     Acute Stroke  Dysphagia  Type 1 DM  Osteomyelitis of Left Foot Acute on Chronic Normocytic Anemia - Management per  primary team.    I have spent a total of 35 minutes with patient reviewing 2D echo , telemetry, EKGs, labs and examining patient as well as establishing an assessment and plan that was discussed with the patient.  > 50% of time was spent in direct patient care.      Outpatient follow-up has been arranged on 02/19/22 with cardiology    For questions or updates, please contact [place] Please consult [email or web address] for contact info under        Signed, Fransico Him, MD  02/04/2022, 8:46 AM

## 2022-02-05 LAB — BASIC METABOLIC PANEL
Anion gap: 11 (ref 5–15)
BUN: 31 mg/dL — ABNORMAL HIGH (ref 6–20)
CO2: 24 mmol/L (ref 22–32)
Calcium: 9.1 mg/dL (ref 8.9–10.3)
Chloride: 101 mmol/L (ref 98–111)
Creatinine, Ser: 2.32 mg/dL — ABNORMAL HIGH (ref 0.44–1.00)
GFR, Estimated: 27 mL/min — ABNORMAL LOW (ref >60)
Glucose, Bld: 394 mg/dL — ABNORMAL HIGH (ref 70–99)
Potassium: 4.2 mmol/L (ref 3.5–5.1)
Sodium: 136 mmol/L (ref 135–145)

## 2022-02-05 LAB — HEPARIN ANTI-XA: Heparin LMW: 1.32 IU/mL

## 2022-02-05 LAB — GLUCOSE, CAPILLARY
Glucose-Capillary: 267 mg/dL — ABNORMAL HIGH (ref 70–99)
Glucose-Capillary: 293 mg/dL — ABNORMAL HIGH (ref 70–99)
Glucose-Capillary: 418 mg/dL — ABNORMAL HIGH (ref 70–99)
Glucose-Capillary: 438 mg/dL — ABNORMAL HIGH (ref 70–99)

## 2022-02-05 MED ORDER — INSULIN ASPART 100 UNIT/ML IJ SOLN
5.0000 [IU] | Freq: Three times a day (TID) | INTRAMUSCULAR | Status: DC
  Administered 2022-02-05 – 2022-02-06 (×4): 5 [IU] via SUBCUTANEOUS

## 2022-02-05 MED ORDER — INSULIN ASPART 100 UNIT/ML IJ SOLN
0.0000 [IU] | Freq: Every day | INTRAMUSCULAR | Status: DC
  Administered 2022-02-05: 14 [IU] via SUBCUTANEOUS
  Administered 2022-02-06: 5 [IU] via SUBCUTANEOUS
  Administered 2022-02-08: 3 [IU] via SUBCUTANEOUS
  Administered 2022-02-12 – 2022-02-18 (×4): 2 [IU] via SUBCUTANEOUS

## 2022-02-05 MED ORDER — INSULIN ASPART 100 UNIT/ML IJ SOLN
0.0000 [IU] | Freq: Three times a day (TID) | INTRAMUSCULAR | Status: DC
  Administered 2022-02-05: 11 [IU] via SUBCUTANEOUS
  Administered 2022-02-05: 20 [IU] via SUBCUTANEOUS
  Administered 2022-02-06: 11 [IU] via SUBCUTANEOUS
  Administered 2022-02-06: 3 [IU] via SUBCUTANEOUS
  Administered 2022-02-06: 15 [IU] via SUBCUTANEOUS
  Administered 2022-02-07: 7 [IU] via SUBCUTANEOUS
  Administered 2022-02-07: 4 [IU] via SUBCUTANEOUS
  Administered 2022-02-07: 3 [IU] via SUBCUTANEOUS
  Administered 2022-02-08: 7 [IU] via SUBCUTANEOUS
  Administered 2022-02-08: 3 [IU] via SUBCUTANEOUS
  Administered 2022-02-08: 4 [IU] via SUBCUTANEOUS
  Administered 2022-02-09: 11 [IU] via SUBCUTANEOUS
  Administered 2022-02-09: 7 [IU] via SUBCUTANEOUS
  Administered 2022-02-09: 3 [IU] via SUBCUTANEOUS
  Administered 2022-02-10: 15 [IU] via SUBCUTANEOUS
  Administered 2022-02-10 (×2): 7 [IU] via SUBCUTANEOUS
  Administered 2022-02-11: 3 [IU] via SUBCUTANEOUS
  Administered 2022-02-11: 4 [IU] via SUBCUTANEOUS
  Administered 2022-02-11: 7 [IU] via SUBCUTANEOUS
  Administered 2022-02-12: 3 [IU] via SUBCUTANEOUS
  Administered 2022-02-12: 7 [IU] via SUBCUTANEOUS
  Administered 2022-02-13: 4 [IU] via SUBCUTANEOUS
  Administered 2022-02-13: 15 [IU] via SUBCUTANEOUS
  Administered 2022-02-13: 7 [IU] via SUBCUTANEOUS
  Administered 2022-02-14: 11 [IU] via SUBCUTANEOUS
  Administered 2022-02-14: 7 [IU] via SUBCUTANEOUS
  Administered 2022-02-14: 11 [IU] via SUBCUTANEOUS
  Administered 2022-02-15: 20 [IU] via SUBCUTANEOUS
  Administered 2022-02-15: 15 [IU] via SUBCUTANEOUS
  Administered 2022-02-15: 7 [IU] via SUBCUTANEOUS
  Administered 2022-02-16: 4 [IU] via SUBCUTANEOUS
  Administered 2022-02-16: 20 [IU] via SUBCUTANEOUS
  Administered 2022-02-16: 3 [IU] via SUBCUTANEOUS
  Administered 2022-02-17: 20 [IU] via SUBCUTANEOUS
  Administered 2022-02-19: 4.5 [IU] via SUBCUTANEOUS

## 2022-02-05 NOTE — Progress Notes (Signed)
Physical Therapy Session Note  Patient Details  Name: Lauren [name] MRN: [id number] Date of Birth: November 28, 1980  Today's Date: 02/05/2022 PT Individual Time: 0805-0923 PT Individual Time Calculation (min): 78 min   Short Term Goals: Week 1:  PT Short Term Goal 1 (Week 1): Pt will perform bed mobility with increased attn to LUE and consistent MinA. PT Short Term Goal 2 (Week 1): pt will perform sit<>stand with CGA and improved midline positioning. PT Short Term Goal 3 (Week 1): Pt will perform stand pivot transfers with consistent minA. PT Short Term Goal 4 (Week 1): Pt will initiate gait training using LRAD. PT Short Term Goal 5 (Week 1): Pt will initiate stair training.  Skilled Therapeutic Interventions/Progress Updates: Pt presented in recliner with nsg present agreeable to therapy. Pt denies pain at rest but does c/o LUE pain intermittently throughout session. Nsg providing am meds then leaving shortly after. Pt requesting to use bathroom. PTA donned CAM boot total A and performed stand pivot transfer to w/c to L with modA due to difficulty moving LLE with CAM boot on. Pt then transferred to bathroom and performed stand pivot transfer to w/c with use of wall rail and modA. Pt with continent B&B void at toilet. Pt able to perform front peri-care however required total A to clean buttocks and ensure cleanliness. Pt performed multiple Sit to stand with use of wall rail to allow PTA to perform peri-care but multiple sits due to fatigue. Pt then performed stand pivot to return to w/c in same manner as prior. In room PTA threaded pants modA and pt requesting to change to post op shoe for stability. As PTA's back was turned pt stood without assist fortunately no LOB. Pt required modA to pull pants over hips. Pt then transported to day room and moved to [employer]bex Kinetron performing 2 x 10 cycles at 70cm/sec for forced use of LLE and reciprocal activity. Pt then transported to rehab gym and participated in  gait training requiring CGA to stand in parallel bars and was able to ambulate ~45f forward and backwards with minA. Pt noted to significant adduct but no scissoring. Pt returned to w/c and transported back to room. Pt requiring to remain in w/c as next session to start soon. Pt left in w/c at end of session with half lap tray on, call bell within reach and needs met.      Therapy Documentation Precautions:  Precautions Precautions: Fall Precaution Comments: L inattention, R gaze preference, L hemiplegia, no dressing to L foot - wear post-op shoe OOB Required Braces or Orthoses: Other Brace Other Brace: dressing on L foot Restrictions Weight Bearing Restrictions: No General:   Vital Signs:   Pain:   Mobility:   Locomotion :    Trunk/Postural Assessment :    Balance:   Exercises:   Other Treatments:      Therapy/Group: Individual Therapy  Braya Habermehl 02/05/2022, 1:14 PM

## 2022-02-05 NOTE — Progress Notes (Signed)
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  She was initially admitted under PCCM service, extubated on 01/18/2022 and transferred to [facility] on 01/21/2022.  Palliative care was asked to get involved to further address goals of care in the setting of a large stroke.   Today's Discussion 02/05/2022  *Please note that this is a verbal dictation therefore any spelling or grammatical errors are due to the "[employer]" system interpretation.  Chart reviewed inclusive of vital signs, progress notes, laboratory results, and diagnostic images.  Patient assessed at the bedside.  She is sitting up in her wheelchair, ordering her meals. Her daughter is present visiting.  Created space and opportunity for patient and family's thoughts and feelings on her current illness.  Patient's daughter Lauren [name] has some concerns about potential discharge on 10/20 and how much they will be able to know/plan in advance about needs for arranging the home. She is glad to see how much patient has already progress thus far and understands rationale for delaying plans until there is a better picture of her new baseline.  Lauren [name]'s priority is ensuring the home is safe and accessible for her mother.  Patient notes there are some concerns about her daughter helping her transfer from wheelchair back to bed.  Her daughter feels very comfortable doing this.  We also discussed the advocacy patient's daughter is providing for her, in all aspects of her care including patient's wound and several transfusions.  Patient is appreciative of this.  Patient shares with me that her goal remains to have as much functional  recovery/improvement as possible.  We discussed her episode of chest pain and the lack of options to prevent this from happening.  She understands difficulty in managing her heart and kidneys long-term however she is not yet ready for hospice at this time.  Questions and concerns addressed. Palliative Support Provided.   Objective Assessment: Vital Signs Vitals:   02/05/22 0442 02/05/22 0803  BP: (!) 130/58 136/70  Pulse: 95   Resp: 18   Temp: 98.6 F (37 C)   SpO2: 100%     Intake/Output Summary (Last 24 hours) at 02/05/2022 1251 Last data filed at 02/05/2022 0900 Gross per 24 hour  Intake 915 ml  Output 225 ml  Net 690 ml    Last Weight  Most recent update: 02/05/2022  5:58 AM    Weight  83.3 kg (183 lb 10.3 oz)            Gen:  Adult Caucasian F in NAD CV: Regular rate and rhythm  PULM: ON RA breathing is even and nonlabored ABD: soft/nontender  EXT: No edema Neuro: Alert and oriented x3 Psych: normal mood and affect   SUMMARY OF RECOMMENDATIONS   -Continue DNAR/DNI -Continue current care -Goals remain clear, hopeful for more functional improvement and patient is not ready for hospice at this time -PMT will continue to support incrementally as needed.  Please call team line or secure chat should urgent needs arise    MDM: High ______________________________________________________________________________________ Lauren Cooler, PA-C [facility] Team Team Cell Phone: [phone] Please utilize secure chat with additional questions, if there is no response within 30 minutes please call  the above phone number  Palliative Medicine Team providers are available by phone from 7am to 7pm daily and can be reached through the team cell phone.  Should this patient require assistance outside of these hours, please call the patient's attending physician.

## 2022-02-05 NOTE — Progress Notes (Signed)
Occupational Therapy Session Note  Patient Details  Name: Lauren [name] MRN: [id number] Date of Birth: [birth year]  Today's Date: 02/05/2022 OT Individual Time: 1300-1415 OT Individual Time Calculation (min): 75 min    Short Term Goals: Week 1:  OT Short Term Goal 1 (Week 1): Pt to be Mod A for UB dressing OT Short Term Goal 2 (Week 1): Pt to be Mod A for LB dressing OT Short Term Goal 3 (Week 1): Pt to be Mod A for toileting OT Short Term Goal 4 (Week 1): Pt to demonstrate improved attention to L side with no more than 3 verbal cues OT Short Term Goal 5 (Week 1): Pt to perform functional transfers with Min A  Skilled Therapeutic Interventions/Progress Updates:    Upon OT arrival, pt seated in w/c reporting no pain and is agreeable to OT treatment. Pt's family at bedside. Treatment intervention with a focus on self care retraining, visual perception, attention to the L side, problem solving, and standing balance. Pt was positioned in front of the sink to wash face and brush her teeth with Min A. Verbal cues and hand over hand assist provided to manage containers. Pt also requires verbal cues to locate items placed on L of sink. Pt was transported to dayroom gym via w/c and total A. While seated in w/c, pt engages 2 games of jumbo connect 4 using the R UE to improve scanning, attention, sitting balance, crossing midline and problem solving. Pt with min difficulty to complete requiring min verbal cues to attend to L side and problem solve. Between trials, pt retrieves game pieces from the floor using a reacher and the R UE and places them back on the game piece holders. Pt sits in w/c to clean 6  large game pieces using B UE with L UE used as a stabilizer. Pt was positioned infront of vertical board and completes multiple sit to stand transfers with Min A and stands with Min A to locate cards on L side and place on matching card on board using the R UE. Pt demonstrates min difficulty matching cards  and scanning requiring verbal cues to attend to L side. Pt performs 3 trials with seated rest breaks requiring Min A for stand to sit transfers. Pt was returned to her room via w/c and total A and left in w/c at end of session with all needs met and safety measures in place. Pt's family at bedside.   Therapy Documentation Precautions:  Precautions Precautions: Fall Precaution Comments: L inattention, R gaze preference, L hemiplegia, no dressing to L foot - wear post-op shoe OOB Required Braces or Orthoses: Other Brace Other Brace: dressing on L foot Restrictions Weight Bearing Restrictions: No    Therapy/Group: Individual Therapy  Nunzio Banet 02/05/2022, 2:30 PM

## 2022-02-05 NOTE — Progress Notes (Signed)
Occupational Therapy Weekly Progress Note  Patient Details  Name: Lauren [name] MRN: [id number] Date of Birth: December 21, 1980  Beginning of progress report period: January 29, 2022 End of progress report period: February 05, 2022  Today's Date: 02/05/2022 OT Individual Time: 1000-1100 60 min     Patient has met 5 of 5 short term goals.  Pt has made steady progress since IE. Pt has had some medical barriers such as hypotension and cardiac w/u but consistently participates and has increased functional level with support of CIR program and strong family support.   Patient continues to demonstrate the following deficits: muscle weakness and muscle paralysis, decreased cardiorespiratoy endurance, impaired timing and sequencing, abnormal tone, unbalanced muscle activation, motor apraxia, decreased coordination, and decreased motor planning, decreased midline orientation, decreased attention to left, and decreased motor planning, decreased initiation, decreased attention, decreased awareness, decreased problem solving, decreased safety awareness, decreased memory, and delayed processing, and decreased sitting balance, decreased standing balance, decreased postural control, hemiplegia, and decreased balance strategies and therefore will continue to benefit from skilled OT intervention to enhance overall performance with BADL, iADL, Vocation, and Reduce care partner burden.  Patient progressing toward long term goals..  Continue plan of care.  OT Short Term Goals Week 1:  OT Short Term Goal 1 (Week 1): Pt to be Mod A for UB dressing OT Short Term Goal 1 - Progress (Week 1): Met OT Short Term Goal 2 (Week 1): Pt to be Mod A for LB dressing OT Short Term Goal 2 - Progress (Week 1): Met OT Short Term Goal 3 (Week 1): Pt to be Mod A for toileting OT Short Term Goal 3 - Progress (Week 1): Met OT Short Term Goal 4 (Week 1): Pt to demonstrate improved attention to L side with no more than 3 verbal cues OT  Short Term Goal 4 - Progress (Week 1): Met OT Short Term Goal 5 (Week 1): Pt to perform functional transfers with Min A OT Short Term Goal 5 - Progress (Week 1): Met Week 2:  OT Short Term Goal 1 (Week 2): Pt will require set up and min cues for simple grooming sinkside using L hand as gross assist with min cus OT Short Term Goal 2 (Week 2): Pt will recall using hemi techniques for UB and LB clothing management with min cues OT Short Term Goal 3 (Week 2): Pt will consistently attend to L side during functional task 2/3 trials with mod cues OT Short Term Goal 4 (Week 2): Pt will transfer off and on toilet and tub transfer bench with CGA and cues to reduce impulsivity  Skilled Therapeutic Interventions/Progress Updates:   Pt seen for am self care retraining session with focus on L sided awareness, safety, functional use and compensatory strategies, enhanced mobility and family education with dtr Julliana. Pt requesting full shower, dressing and grooming this am and OT worked with pt and dtr to ensure proper carryover wupon d/c. Pt continues to demonstrate impulsivity, decreased postural control, attention to L side and significant UE >LE L hemiplegia. Improved transfers w/c off and on tub transfer bench with mod A and cues. Assist to cover L LE wound but improved ability to cross B legs up for LB self care. Issued and trained in [facility] sponge and required overall mod A UB/LB bathing and dressing sink side. Grooming with mod fading to min a. Collaboration for weekly goal upgrades and ned to wear resting hand spling nightly as pt with only trace to 1+ return L  UE overall. Left pt at end of session with chair alarm active in the care assist of dtr who was completing hair styling sink side.    Therapy Documentation Precautions:  Precautions Precautions: Fall Precaution Comments: L inattention, R gaze preference, L hemiplegia, no dressing to L foot - wear post-op shoe OOB Required Braces or Orthoses: Other  Brace Other Brace: dressing on L foot Restrictions Weight Bearing Restrictions: No    Therapy/Group: Individual Therapy  [name] 02/05/2022, 3:34 PM

## 2022-02-05 NOTE — Progress Notes (Signed)
PROGRESS NOTE   Subjective/Complaints:  Pt reports breathing is OK- no more chest pain.   Cards is recommending hospice, per their note- am calling Dr Radford Pax to discuss further- also d/w PA- because we need to know if need Palliative care to discuss hospice with pt   ROS:   Pt denies SOB, abd pain, CP, N/V/C/D, and vision changes    Objective:   DG CHEST PORT 1 VIEW  Result Date: 02/03/2022 CLINICAL DATA:  Chest pain EXAM: PORTABLE CHEST 1 VIEW COMPARISON:  01/24/2022 FINDINGS: Single frontal view of the chest demonstrates stable mild enlargement of the cardiac silhouette. Interval increase in central vascular congestion and diffuse interstitial prominence, with patchy bibasilar airspace disease and has developed in the interim. No effusion or pneumothorax. IMPRESSION: 1. Findings most consistent with volume overload and mild pulmonary edema. Electronically Signed   By: Randa Ngo M.D.   On: 02/03/2022 23:50   Recent Labs    02/04/22 0058 02/04/22 0529  WBC 9.1 9.5  HGB 7.0* 7.8*  HCT 21.6* 24.5*  PLT 430* 462*   Recent Labs    02/04/22 0058 02/04/22 0529  NA 135 135  K 3.9 4.0  CL 108 107  CO2 21* 20*  GLUCOSE 201* 160*  BUN 27* 27*  CREATININE 2.26* 2.12*  CALCIUM 8.1* 8.3*    Intake/Output Summary (Last 24 hours) at 02/05/2022 1041 Last data filed at 02/04/2022 1836 Gross per 24 hour  Intake 795 ml  Output 225 ml  Net 570 ml         Physical Exam: Vital Signs Blood pressure 136/70, pulse 95, temperature 98.6 F (37 C), resp. rate 18, weight 83.3 kg, last menstrual period 11/01/2021, SpO2 100 %.      General: awake, alert, appropriate, woke from sleep- said doesn't want to wake up;  NAD HENT: R  gaze preference; oropharynx moist CV: borderline tachycardic rate; no JVD Pulmonary: CTA B/L; no W/R/R- good air movement GI: soft, NT, ND, (+)BS Psychiatric: flat, frustrated with being woken  up Neurological: sleepy but answering questions- poor memory Skin: + L 5th toe amputation; wound site open with mild serous drainage, clean wound bed, no erythema or warmth.  Seen wrapped up in ACE wrap- C/D/I   PE from prior encounter: MSK:      No apparent deformity.      Strength:                RUE: 5/5 SA, 5/5 EF, 5/5 EE, 5/5 WE, 5/5 FF, 5/5 FA                 LUE: flaccid, 0/5 throughout                RLE: 5/5 HF, 5/5 KE, 5/5 DF, 5/5 EHL, 5/5 PF                 LLE:  3/5 HF, 2/5 KE, 0/5 DF, 0/5 EHL, 1/5 PF    Neurologic exam:  Cognition: AAO to person, place, time and event.  Language: Fluent, No substitutions or neoglisms.Moderate dysarthria Mood: Pleasant affect, appropriate mood.  Sensation: To light touch intact in BL UEs and LEs  CN: Left facial droop   Assessment/Plan: 1. Functional deficits which require 3+ hours per day of interdisciplinary therapy in a comprehensive inpatient rehab setting. Physiatrist is providing close team supervision and 24 hour management of active medical problems listed below. Physiatrist and rehab team continue to assess barriers to discharge/monitor patient progress toward functional and medical goals  Care Tool:  Bathing    Body parts bathed by patient: Right arm, Left arm, Chest, Abdomen, Buttocks, Right upper leg, Left upper leg, Face   Body parts bathed by helper: Front perineal area, Right lower leg, Left lower leg     Bathing assist Assist Level: Moderate Assistance - Patient 50 - 74%     Upper Body Dressing/Undressing Upper body dressing   What is the patient wearing?: Bra, Pull over shirt    Upper body assist Assist Level: Maximal Assistance - Patient 25 - 49%    Lower Body Dressing/Undressing Lower body dressing      What is the patient wearing?: Incontinence brief, Pants     Lower body assist Assist for lower body dressing: Maximal Assistance - Patient 25 - 49%     Toileting Toileting Toileting Activity did  not occur (Clothing management and hygiene only): N/A (no void or bm)  Toileting assist Assist for toileting: Maximal Assistance - Patient 25 - 49%     Transfers Chair/bed transfer  Transfers assist     Chair/bed transfer assist level: Moderate Assistance - Patient 50 - 74%     Locomotion Ambulation   Ambulation assist   Ambulation activity did not occur: Safety/medical concerns  Assist level: Minimal Assistance - Patient > 75% Assistive device: Parallel bars Max distance: 30 ft   Walk 10 feet activity   Assist  Walk 10 feet activity did not occur: Safety/medical concerns  Assist level: Minimal Assistance - Patient > 75% Assistive device: Parallel bars   Walk 50 feet activity   Assist Walk 50 feet with 2 turns activity did not occur: Safety/medical concerns         Walk 150 feet activity   Assist Walk 150 feet activity did not occur: Safety/medical concerns         Walk 10 feet on uneven surface  activity   Assist Walk 10 feet on uneven surfaces activity did not occur: Safety/medical concerns         Wheelchair     Assist Is the patient using a wheelchair?: Yes Type of Wheelchair: Manual    Wheelchair assist level: Total Assistance - Patient < 25% Max wheelchair distance: 300 ft    Wheelchair 50 feet with 2 turns activity    Assist        Assist Level: Total Assistance - Patient < 25%   Wheelchair 150 feet activity     Assist      Assist Level: Total Assistance - Patient < 25%   Blood pressure 136/70, pulse 95, temperature 98.6 F (37 C), resp. rate 18, weight 83.3 kg, last menstrual period 11/01/2021, SpO2 100 %.  Medical Problem List and Plan: 1. 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             -patient may shower with lines and foot wound covered              -ELOS/Goals: 14-21 days  WBAT On LLE D/c 10/20 Con't CIR- PT and OT and SLP  2.  Antithrombotics: -DVT/anticoagulation:  Pharmaceutical: Lovenox             -antiplatelet therapy: Brilinta 3. Pain Management: Tylenol, oxycodone as needed 4. Mood/Behavior/Sleep: LCSW to evaluate and provide support             -continue melatonin 3 mg q HS             -antipsychotic agents: n/a 5. Neuropsych/cognition: This patient is capable of making decisions on her own behalf. 6. 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 Fluids/Electrolytes/Nutrition: Strict Is and Os and follow-up chemistries             -dys 3/thin liquids; SLP eval             -repeat mag/phos tomorrow 8: Right ICA dissection with stent placement: on Brilinta 9: Hypertension: monitor             -continue hydralazine 12.5 mg TID             -continue isosorbide 20 mg BID             -continue Lopressor 37.5 mg BID  10/2- BP is controlled- con't regimen  10/3- had BP of 80s/50s yesterday- hydralazine stopped- is better this AM- will see how does in therapy with standing- concern will drop  10/4- will decrease Metoprolol to 25 mg BID- not change to succinate, since won't have ability to change meds as often.   10/5- BP overnight 110s/120s supine- will see how tolerates therapy today.   10/6- BP doing better, they increased her B blocker back to 37.5 mg BID 10: Hyperlipidemia: continue Lipitor 40 mg daily 11: Coagulopathy s/p RUE embolectomy 10/2021: Eliquis PTA now on Lovenox; f                -follow-up hematology as outpt 12: COPD exacerbation: hypoxia resolved; continue bronchodilators/nebs 13: Chronic diastolic and systolic heart failure:             -strict Is and Os and daily weight             -no diuretics currently             -see meds above   Filed Weights   02/02/22 0500 02/03/22 0500 02/05/22 0500  Weight: 84.7 kg 84.8 kg 83.3 kg   10/5- will start Lasix  10 mg daily due to vascualar congestion on CXR  10/6- Per Cards, needs 40 mg daily- will change 14: Hypothyroidism: continue Synthroid 125 mcg daily; follow-up TSH 15: 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   10/3- ate breakfast tray this AM- BG's running from 56 (low yesterday) to 312 yesterday due to foods form home- will ask DM coordinator to help try and manage.   10/4- CBG's mid 100s to mid 200s- a little better in last  24 hours- no hypoglycemia  10/6- Dm coordinator - increased meals novolog 5 units TID with meals and night/QHS 0-5 SSI 17: Class 2 obesity: dietary counseling 18: Tobacco abuse: cessation counseling; continue nicotine patch 19: AKI atop CKD IV: trend BMP  -9/29 Cr improved to 1.88  10/2- Down to 1.59 and BUN 24- will push fluids and recheck in AM  10/3- Cr 1.64 and BUN 24- basically stable- will recheck Thursday-   10/4- ordered BMP for tomorrow  10/5- Cr 2.12 up from 1.64- but required Lasix 40 mg PO x1- actually will start lasix 10 mg daily due to pulm/vascular congestion-   10/6- CR usually runs ~ 2 per Cards note-  20: CAD: stable; treated medically; Dr. Harl Bowie 21: Left foot osteomyelitis s/p ray amputation [name] -recent bone biopsy; cultures neg - Husband to bring in ?CAM boot for weightbearing, protection - Remove IJ order - 9/30 Noted surgical site dehiscence with PT ambulation, was not wearing orthotic. Podiatry consulted, imaging showing mildly angulated fracture of the distal fourth metatarsal (likely iatrogenic from prior amputation)               - Orders for betadyne dressing daily                - NWB LLE               - Appreciate podiatry recs, to discuss 4th MT fx as group next week  10/2- Podiatry says once has CAM boot to be worn- either from home or for [name] to order- d/w PA-  and will check in to it. Supposedly had a dehiscence but podiatry to see again today.   10/3- podiatry said they'd come back for possible bedside debridement- can WBAT now with CAM boot 22: Leukocytosis: trending down; follow-up CBC  -9/29 WBC 9.5 today, improved 23. Dysphagia  -Advanced to heart healthy/carb modified/ thin liquids            - 9/30 - family requesting diet liberalization as patient primarily eating foods brought in from outside d/t hospital food bland; carb modified diet unable to be ordered with less restrictive parameters. Dietary consulted.   24. RSV  10/2- will take off Droplet precautions- no more Sx's.  25. Chest pain- in setting of severe cardiac issues including CHF with EF 35-40%, CAD- acute on chronic CHF  10/5- CP last night- required NTG x2- EKG had no changes- troponins elevated probably due to pulmonary congestion which was also found- will transfuse 1 unit to help CP; also restart her home Protonix for hx of reflux.  26, ABLA  10/5- will transfuse 1 unit- think the Hb 7.8 that came back 5 hours later is likely due to hemoconcentration, and isn't real, since her Hb has been trending down for days. On Lovenox and Brillenta- check Fecal occult for blood loss.  27. Insomnia  10/5- will increase trazodone to 75 mg QHS per pt request  10/6- slepeing better per pt.    I spent a total of 52   minutes on total care today- >50% coordination of care- due to d/w Cards about hospice, PA about medical issues and Dm coordinator.    LOS: 8 days A FACE TO FACE EVALUATION WAS PERFORMED  Tamorah Hada 02/05/2022, 10:41 AM

## 2022-02-05 NOTE — Progress Notes (Signed)
ANTICOAGULATION CONSULT NOTE - Follow Up Consult  Pharmacy Consult for Lovenox Indication:  hypercoagulable state  Labs: Recent Labs    02/02/22 0506 02/03/22 2317 02/04/22 0058 02/04/22 0529 02/04/22 2335  HGB 7.7*  --  7.0* 7.8*  --   HCT 24.1*  --  21.6* 24.5*  --   PLT 513*  --  430* 462*  --   HEPRLOWMOCWT  --   --   --   --  1.32  CREATININE 1.64*  --  2.26* 2.12*  --   TROPONINIHS  --  174* 181*  --   --     Assessment/Plan:  41yo female therapeutic on enoxaparin after dose change. Will continue enoxaparin at current dose of 120mg  Q24H.   Wynona Neat, PharmD, BCPS  02/05/2022,12:21 AM

## 2022-02-05 NOTE — Inpatient Diabetes Management (Signed)
Inpatient Diabetes Program Recommendations  AACE/ADA: New Consensus Statement on Inpatient Glycemic Control (2015)  Target Ranges:  Prepandial:   less than 140 mg/dL      Peak postprandial:   less than 180 mg/dL (1-2 hours)      Critically ill patients:  140 - 180 mg/dL   Lab Results  Component Value Date   GLUCAP 267 (H) 02/05/2022   HGBA1C 8.0 (H) 01/09/2022    Review of Glycemic Control  Latest Reference Range & Units 02/04/22 05:53 02/04/22 11:38 02/04/22 17:17 02/04/22 21:39 02/05/22 06:25  Glucose-Capillary 70 - 99 mg/dL 157 (H) 187 (H) 211 (H) 265 (H) 267 (H)  (H): Data is abnormally high  Current orders for Inpatient glycemic control:  Levemir 25 units BID, Novolog 0-20 units TID and 2 units TID with meals  Inpatient Diabetes Program Recommendations:    Novolog 0-5 units QHS Novolog 5 units TID with meals   Will continue to follow while inpatient.  Thank you, Reche Dixon, MSN, [place] [facility] Coordinator Inpatient Diabetes Program [phone] (team pager from 8a-5p)

## 2022-02-05 NOTE — Progress Notes (Signed)
Stat labs still not completed, per MD treat with 20 units as ordered for CBG 400 and cover meal as appropriate.

## 2022-02-05 NOTE — Progress Notes (Addendum)
Rounding Note    Patient Name: Lauren [name] Date of Encounter: 02/05/2022  [place] Cardiologist: Carlyle Dolly, MD   Subjective   No further chest pain since yesterday morning.   Inpatient Medications    Scheduled Meds:  atorvastatin  40 mg Per Tube Daily   enoxaparin (LOVENOX) injection  120 mg Subcutaneous Q24H   furosemide  40 mg Oral Daily   insulin aspart  0-20 Units Subcutaneous TID WC   insulin aspart  5 Units Subcutaneous TID WC   insulin detemir  25 Units Subcutaneous BID   isosorbide mononitrate  20 mg Oral BID   levothyroxine  125 mcg Oral Q0600   melatonin  3 mg Oral QHS   metoprolol tartrate  37.5 mg Oral BID   nicotine  21 mg Transdermal Daily   pantoprazole  40 mg Oral Daily   ranolazine  500 mg Oral BID   revefenacin  175 mcg Nebulization Daily   sodium chloride flush  10-40 mL Intracatheter Q12H   sodium chloride flush  3 mL Intravenous Q12H   ticagrelor  90 mg Oral BID   Or   ticagrelor  90 mg Per Tube BID   traZODone  75 mg Oral QHS   Continuous Infusions:  sodium chloride     PRN Meds: sodium chloride, acetaminophen, alum & mag hydroxide-simeth, diphenhydrAMINE, guaiFENesin-dextromethorphan, influenza vac split quadrivalent PF, ipratropium-albuterol, methocarbamol, nitroGLYCERIN, oxyCODONE, pneumococcal 20-valent conjugate vaccine, prochlorperazine **OR** prochlorperazine **OR** prochlorperazine, senna-docusate, sodium chloride flush, sodium chloride flush, sodium phosphate, sorbitol   Vital Signs    Vitals:   02/04/22 2100 02/05/22 0442 02/05/22 0500 02/05/22 0803  BP: 133/88 (!) 130/58  136/70  Pulse: 100 95    Resp: 18 18    Temp: 98.1 F (36.7 C) 98.6 F (37 C)    TempSrc: Oral     SpO2: 100% 100%    Weight:   83.3 kg     Intake/Output Summary (Last 24 hours) at 02/05/2022 0956 Last data filed at 02/04/2022 1836 Gross per 24 hour  Intake 795 ml  Output 225 ml  Net 570 ml       02/05/2022    5:00 AM  02/03/2022    5:00 AM 02/02/2022    5:00 AM  Last 3 Weights  Weight (lbs) 183 lb 10.3 oz 186 lb 15.2 oz 186 lb 11.7 oz  Weight (kg) 83.3 kg 84.8 kg 84.7 kg      Telemetry    N/A - Personally Reviewed  ECG    No new EKG to review- Personally Reviewed  Physical Exam   GEN: Well nourished, well developed in no acute distress HEENT: Normal NECK: No JVD; No carotid bruits LYMPHATICS: No lymphadenopathy CARDIAC:RRR, no murmurs, rubs, gallops RESPIRATORY:  Clear to auscultation without rales, wheezing or rhonchi  ABDOMEN: Soft, non-tender, non-distended MUSCULOSKELETAL:  No edema; No deformity  SKIN: Warm and dry NEUROLOGIC:  Alert and oriented x 3 PSYCHIATRIC:  Normal affect   Labs    High Sensitivity Troponin:   Recent Labs  Lab 01/23/22 1500 01/23/22 2250 01/24/22 0050 02/03/22 2317 02/04/22 0058  TROPONINIHS 127* 132* 133* 174* 181*      Chemistry Recent Labs  Lab 02/02/22 0506 02/04/22 0058 02/04/22 0529  NA 138 135 135  K 4.0 3.9 4.0  CL 111 108 107  CO2 22 21* 20*  GLUCOSE 241* 201* 160*  BUN 24* 27* 27*  CREATININE 1.64* 2.26* 2.12*  CALCIUM 8.5* 8.1* 8.3*  PROT 5.4*  --   --  ALBUMIN 2.4*  --   --   AST 24  --   --   ALT 28  --   --   ALKPHOS 89  --   --   BILITOT 0.6  --   --   GFRNONAA 41* 28* 30*  ANIONGAP 5 6 8      Lipids No results for input(s): "CHOL", "TRIG", "HDL", "LABVLDL", "LDLCALC", "CHOLHDL" in the last 168 hours.  Hematology Recent Labs  Lab 02/02/22 0506 02/04/22 0058 02/04/22 0529  WBC 9.2 9.1 9.5  RBC 2.80* 2.49* 2.81*  HGB 7.7* 7.0* 7.8*  HCT 24.1* 21.6* 24.5*  MCV 86.1 86.7 87.2  MCH 27.5 28.1 27.8  MCHC 32.0 32.4 31.8  RDW 16.6* 16.6* 16.6*  PLT 513* 430* 462*    Thyroid  No results for input(s): "TSH", "FREET4" in the last 168 hours.   BNPNo results for input(s): "BNP", "PROBNP" in the last 168 hours.  DDimer No results for input(s): "DDIMER" in the last 168 hours.   Radiology    DG CHEST PORT 1  VIEW  Result Date: 02/03/2022 CLINICAL DATA:  Chest pain EXAM: PORTABLE CHEST 1 VIEW COMPARISON:  01/24/2022 FINDINGS: Single frontal view of the chest demonstrates stable mild enlargement of the cardiac silhouette. Interval increase in central vascular congestion and diffuse interstitial prominence, with patchy bibasilar airspace disease and has developed in the interim. No effusion or pneumothorax. IMPRESSION: 1. Findings most consistent with volume overload and mild pulmonary edema. Electronically Signed   By: Randa Ngo M.D.   On: 02/03/2022 23:50    Cardiac Studies   Echo from 01/14/22:   1. Left ventricular ejection fraction, by estimation, is 35 to 40%. Left  ventricular ejection fraction by 2D MOD biplane is 37.1 %. The left  ventricle has moderately decreased function. The left ventricle  demonstrates regional wall motion abnormalities  (see scoring diagram/findings for description). There is mild left  ventricular hypertrophy. Left ventricular diastolic parameters are  consistent with Grade II diastolic dysfunction (pseudonormalization).  Elevated left ventricular end-diastolic pressure.  The E/e' is 5. Wall motion abnormality suggestive of LAD territory  infarct or less likely Takatsubo cardiomyopathy.   2. Right ventricular systolic function is normal. The right ventricular  size is normal. Tricuspid regurgitation signal is inadequate for assessing  PA pressure.   3. The mitral valve is abnormal. Trivial mitral valve regurgitation.   4. The inferior vena cava is normal in size with <50% respiratory  variability, suggesting right atrial pressure of 8 mmHg.   5. The aortic valve is tricuspid. Aortic valve regurgitation is not  visualized.   Comparison(s): Changes from prior study are noted. 01/10/2022: LVEF 30-35%,  mid to apical ventricular wall thinning and severe hypokinesis to  akinesis.   Patient Profile     41 y.o. 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 She went immediately to IR for thrombectomy.  She suffered dysphagia after the CVA, declined PEG tube placement. Course was complicated by AKI. She also was on Lovenox PTA due to right brachial artery occlusion required thrombectomy 10/2021.  Patient is now discharged to CIR since 01/28/22.  Cardiology was consulted on 01/11/2022 for further evaluation of EKG changes and elevated troponin. Trop peaked 6606. Echo this admission shows LVEF of 30-30% with multiple wall motion abnormalities. She never had any chest pain. Given acute CVA and severe anemia with Hgb down to 6.5, she was recommended medical management of CAD with Brilinta 90mg  twice daily, Toprol-XL 75mg  daily, Imdur 20mg  twice daily and Lipitor 40mg  daily. Hospital course is c/w troponin elevation likely demand from anemia and dysphagia (refused PEG tube).  She suffered AKI on CKD IV, diuretics and GDMT was held for CHF beside Toprol XL and Imdur.   Cardiology signed off 01/27/22. Cardiology is re-consulted for hypotension a few days ago and her medications were adjusted and metoprolol tartrate was changed to metoprolol succinate 25 mg daily and hydralazine was decreased to 12.5 mg twice daily.  She was felt not a candidate for ICD therapy as she was DNR.  Continued on Imdur 20 mg twice daily as well.   Assessment & Plan     Hypotension - resolved  CAD/chest pain - Patient has a history of CAD with moderate to severe disease on cardiac catheterization in 2015 (treated medically). She presented this  admission with an acute stroke and was found to have elevated troponin. Hs troponin peaked at 3,869. Echo this admission shows LVEF of 30-30% with multiple wall motion abnormalities. - Trop elevation was felt likely due to type 2 demand ischemia. No plans for inpatient ischemic workup given due to acute stroke and severe anemia and advanced CKD as well as the fact the patient is DNR and  palliative care is following.  - Had an episode of chest pain 10/4 but no further episodes.  EKG was unchanged from a month prior and repeat sensitivity pending remained fairly flat going from 174->>181 the day prior 181 -Chest x-ray showed volume overload which likely was the reason for her chest discomfort -Is a poor candidate for cardiac cath at this time due to AKI on CKD serum creatinine 2.12 this morning, recent acute CVA and she is also DNR and palliative care is following -Her beta-blocker had recently been decreased due to hypotension as well as stopping hydralazine - Increased Lopressor to 37.5mg  BID yesterday and BP stable today - Continue metoprolol 37.5 mg twice daily as well as Ranexa 500 mg twice daily(added yesterday) - Palliative care has been involved last admission, patient is DNR.  - Not much more to add at this time and therefore recommend proceeding with hospice   Acute on chronic Combined CHF - Initial Echo on 9/10 showed LVEF of 30-35% with akinesis of the mid to apical septal LV segments, all apical segments, and apex as well as significant thinning of the mid to apical septal segments and apex likely representing scarring/infarct. 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  - Had chest pain and shortness of breath a few nights ago and chest x-ray consistent with volume overload>> now on Lasix 40mg  daily PO - I's and O's appear incomplete - wt down 3kg from yesterday - GDMT: Lopressor 37.5 mg twice daily and isosorbide 20mg  daily twice daily - NO ACEi/ARB/ARNI /MRA or SGLT2Ii  given advanced CKD  - Hydralazine was stopped a few days ago due to hypotension - She is DNR and palliative care is following no indication for ICD placement - Continue Lasix 40 mg daily - check BMET today   Hyperlipidemia - Lipid panel this past admission:  Total Cholesterol 85, Triglycerides 154, HDL 15, LDL 39. - on Lipitor 40mg  daily.   CKD stage IV - Baseline creatinine around 2.0 to 2.5.    Right Brachial Artery Occlusion S/p thrombectomy in 10/2021. - On Eliquis prior to admission, transitioned to Therapeutic Lovenox PTA   Acute Stroke  Dysphagia  Type 1 DM  Osteomyelitis of Left Foot Acute on Chronic Normocytic Anemia - Management per primary team.    I have spent a total of 35 minutes with patient reviewing 2D echo , telemetry, EKGs, labs and examining patient as well as establishing an assessment and plan that was discussed with the patient.  > 50% of time was spent in direct patient care.     [facility] will sign off.   Medication Recommendations: Atorvastatin 40 mg daily, Lasix 40 mg daily, Imdur 20 mg twice daily, Lopressor 37.5 mg twice daily, Ranexa 500 mg twice daily, Brilinta 90 mg twice daily Other recommendations (labs, testing, etc):  None Follow up as an outpatient:  followup with Cardiology 02/19/22     For questions or updates, please contact [place] Please consult [email or web address] for contact info under        Signed, Fransico Him, MD  02/05/2022, 9:56 AM

## 2022-02-06 DIAGNOSIS — E1165 Type 2 diabetes mellitus with hyperglycemia: Secondary | ICD-10-CM

## 2022-02-06 DIAGNOSIS — I959 Hypotension, unspecified: Secondary | ICD-10-CM

## 2022-02-06 LAB — CBC WITH DIFFERENTIAL/PLATELET
Abs Immature Granulocytes: 0.03 10*3/uL (ref 0.00–0.07)
Basophils Absolute: 0.1 10*3/uL (ref 0.0–0.1)
Basophils Relative: 1 %
Eosinophils Absolute: 0.3 10*3/uL (ref 0.0–0.5)
Eosinophils Relative: 4 %
HCT: 26.5 % — ABNORMAL LOW (ref 36.0–46.0)
Hemoglobin: 8.5 g/dL — ABNORMAL LOW (ref 12.0–15.0)
Immature Granulocytes: 0 %
Lymphocytes Relative: 25 %
Lymphs Abs: 1.9 10*3/uL (ref 0.7–4.0)
MCH: 27.9 pg (ref 26.0–34.0)
MCHC: 32.1 g/dL (ref 30.0–36.0)
MCV: 86.9 fL (ref 80.0–100.0)
Monocytes Absolute: 0.6 10*3/uL (ref 0.1–1.0)
Monocytes Relative: 8 %
Neutro Abs: 4.7 10*3/uL (ref 1.7–7.7)
Neutrophils Relative %: 62 %
Platelets: 425 10*3/uL — ABNORMAL HIGH (ref 150–400)
RBC: 3.05 MIL/uL — ABNORMAL LOW (ref 3.87–5.11)
RDW: 16.4 % — ABNORMAL HIGH (ref 11.5–15.5)
WBC: 7.6 10*3/uL (ref 4.0–10.5)
nRBC: 0 % (ref 0.0–0.2)

## 2022-02-06 LAB — BASIC METABOLIC PANEL
Anion gap: 10 (ref 5–15)
BUN: 33 mg/dL — ABNORMAL HIGH (ref 6–20)
CO2: 22 mmol/L (ref 22–32)
Calcium: 8.8 mg/dL — ABNORMAL LOW (ref 8.9–10.3)
Chloride: 105 mmol/L (ref 98–111)
Creatinine, Ser: 2.28 mg/dL — ABNORMAL HIGH (ref 0.44–1.00)
GFR, Estimated: 27 mL/min — ABNORMAL LOW (ref >60)
Glucose, Bld: 137 mg/dL — ABNORMAL HIGH (ref 70–99)
Potassium: 4.1 mmol/L (ref 3.5–5.1)
Sodium: 137 mmol/L (ref 135–145)

## 2022-02-06 LAB — CULTURE, FUNGUS WITHOUT SMEAR

## 2022-02-06 LAB — GLUCOSE, CAPILLARY
Glucose-Capillary: 132 mg/dL — ABNORMAL HIGH (ref 70–99)
Glucose-Capillary: 276 mg/dL — ABNORMAL HIGH (ref 70–99)
Glucose-Capillary: 320 mg/dL — ABNORMAL HIGH (ref 70–99)
Glucose-Capillary: 449 mg/dL — ABNORMAL HIGH (ref 70–99)

## 2022-02-06 LAB — OCCULT BLOOD X 1 CARD TO LAB, STOOL: Fecal Occult Bld: NEGATIVE

## 2022-02-06 MED ORDER — INSULIN ASPART 100 UNIT/ML IJ SOLN
7.0000 [IU] | Freq: Three times a day (TID) | INTRAMUSCULAR | Status: DC
  Administered 2022-02-07 – 2022-02-10 (×11): 7 [IU] via SUBCUTANEOUS

## 2022-02-06 MED ORDER — INSULIN ASPART 100 UNIT/ML IJ SOLN
2.0000 [IU] | Freq: Once | INTRAMUSCULAR | Status: AC
  Administered 2022-02-06: 2 [IU] via SUBCUTANEOUS

## 2022-02-06 NOTE — Progress Notes (Signed)
PROGRESS NOTE   Subjective/Complaints:  She denies any further chest pain.  She was seen by palliative care today with plan to continue current care.  She has had hyperglycemia.   ROS:   Pt denies SOB, abd pain, CP, N/V/C/D, and vision changes    Objective:   No results found. Recent Labs    02/04/22 0529 02/06/22 0626  WBC 9.5 7.6  HGB 7.8* 8.5*  HCT 24.5* 26.5*  PLT 462* 425*    Recent Labs    02/05/22 1821 02/06/22 0626  NA 136 137  K 4.2 4.1  CL 101 105  CO2 24 22  GLUCOSE 394* 137*  BUN 31* 33*  CREATININE 2.32* 2.28*  CALCIUM 9.1 8.8*     Intake/Output Summary (Last 24 hours) at 02/06/2022 2120 Last data filed at 02/06/2022 1835 Gross per 24 hour  Intake 533 ml  Output --  Net 533 ml          Physical Exam: Vital Signs Blood pressure (!) 129/45, pulse (!) 51, temperature (!) 97.5 F (36.4 C), temperature source Oral, resp. rate 18, weight 84.4 kg, last menstrual period 11/01/2021, SpO2 98 %.      General: awake, alert, appropriate, NAD HENT: R  gaze preference; oropharynx moist CV: borderline tachycardic rate; no JVD Pulmonary: CTA B/L; no W/R/R- non-labored GI: soft, NT, ND, (+)BS Psychiatric: flat Neurological: awake and alert and oriented x4 Skin: + L 5th toe amputation; wound site open with mild serous drainage, clean wound bed, no erythema or warmth.  Seen wrapped up in ACE wrap- C/D/I   PE from prior encounter: MSK:      No apparent deformity.      Strength:                RUE: 5/5 SA, 5/5 EF, 5/5 EE, 5/5 WE, 5/5 FF, 5/5 FA                 LUE: flaccid, 0/5 throughout                RLE: 5/5 HF, 5/5 KE, 5/5 DF, 5/5 EHL, 5/5 PF                 LLE:  3/5 HF, 2/5 KE, 0/5 DF, 0/5 EHL, 1/5 PF    Neurologic exam:  Cognition: AAO to person, place, time and event.  Language: Fluent, No substitutions or neoglisms.Moderate dysarthria Mood: Pleasant affect, appropriate mood.   Sensation: To light touch intact in BL UEs and LEs  CN: Left facial droop   Assessment/Plan: 1. Functional deficits which require 3+ hours per day of interdisciplinary therapy in a comprehensive inpatient rehab setting. Physiatrist is providing close team supervision and 24 hour management of active medical problems listed below. Physiatrist and rehab team continue to assess barriers to discharge/monitor patient progress toward functional and medical goals  Care Tool:  Bathing    Body parts bathed by patient: Right arm, Left arm, Chest, Abdomen, Buttocks, Right upper leg, Left upper leg, Face   Body parts bathed by helper: Front perineal area, Right lower leg, Left lower leg     Bathing assist Assist Level: Moderate Assistance -  Patient 50 - 74%     Upper Body Dressing/Undressing Upper body dressing   What is the patient wearing?: Bra, Pull over shirt    Upper body assist Assist Level: Maximal Assistance - Patient 25 - 49%    Lower Body Dressing/Undressing Lower body dressing      What is the patient wearing?: Incontinence brief, Pants     Lower body assist Assist for lower body dressing: Maximal Assistance - Patient 25 - 49%     Toileting Toileting Toileting Activity did not occur (Clothing management and hygiene only): N/A (no void or bm)  Toileting assist Assist for toileting: Maximal Assistance - Patient 25 - 49%     Transfers Chair/bed transfer  Transfers assist     Chair/bed transfer assist level: Moderate Assistance - Patient 50 - 74%     Locomotion Ambulation   Ambulation assist   Ambulation activity did not occur: Safety/medical concerns  Assist level: Minimal Assistance - Patient > 75% Assistive device: Parallel bars Max distance: 30 ft   Walk 10 feet activity   Assist  Walk 10 feet activity did not occur: Safety/medical concerns  Assist level: Minimal Assistance - Patient > 75% Assistive device: Parallel bars   Walk 50 feet  activity   Assist Walk 50 feet with 2 turns activity did not occur: Safety/medical concerns         Walk 150 feet activity   Assist Walk 150 feet activity did not occur: Safety/medical concerns         Walk 10 feet on uneven surface  activity   Assist Walk 10 feet on uneven surfaces activity did not occur: Safety/medical concerns         Wheelchair     Assist Is the patient using a wheelchair?: Yes Type of Wheelchair: Manual    Wheelchair assist level: Total Assistance - Patient < 25% Max wheelchair distance: 300 ft    Wheelchair 50 feet with 2 turns activity    Assist        Assist Level: Total Assistance - Patient < 25%   Wheelchair 150 feet activity     Assist      Assist Level: Total Assistance - Patient < 25%   Blood pressure (!) 129/45, pulse (!) 51, temperature (!) 97.5 F (36.4 C), temperature source Oral, resp. rate 18, weight 84.4 kg, last menstrual period 11/01/2021, SpO2 98 %.  Medical Problem List and Plan: 1. 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             -patient may shower with lines and foot wound covered             -ELOS/Goals: 14-21 days  WBAT On LLE D/c 10/20 Con't CIR- PT and OT and SLP 2.  Antithrombotics: -DVT/anticoagulation:  Pharmaceutical: Lovenox             -antiplatelet therapy: Brilinta 3. Pain Management: Tylenol, oxycodone as needed 4. Mood/Behavior/Sleep: LCSW to evaluate and provide support             -continue melatonin 3 mg q HS             -antipsychotic agents: n/a 5. Neuropsych/cognition: This patient is capable of making decisions on her own behalf. 6. 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 Fluids/Electrolytes/Nutrition: Strict Is and Os and follow-up chemistries             -dys  3/thin liquids; SLP eval             -repeat mag/phos tomorrow 8: Right ICA dissection with stent placement: on Brilinta 9: Hypertension: monitor             -continue hydralazine 12.5 mg TID             -continue isosorbide 20 mg BID             -continue Lopressor 37.5 mg BID  10/2- BP is controlled- con't regimen  10/3- had BP of 80s/50s yesterday- hydralazine stopped- is better this AM- will see how does in therapy with standing- concern will drop  10/4- will decrease Metoprolol to 25 mg BID- not change to succinate, since won't have ability to change meds as often.   10/5- BP overnight 110s/120s supine- will see how tolerates therapy today.   10/6- BP doing better, they increased her B blocker back to 37.5 mg BID   10/7 BP well controlled overall, continue current regiimen 10: Hyperlipidemia: continue Lipitor 40 mg daily 11: Coagulopathy s/p RUE embolectomy 10/2021: Eliquis PTA now on Lovenox; f                -follow-up hematology as outpt 12: COPD exacerbation: hypoxia resolved; continue bronchodilators/nebs 13: Chronic diastolic and systolic heart failure:             -strict Is and Os and daily weight             -no diuretics currently             -see meds above   Filed Weights   02/03/22 0500 02/05/22 0500 02/06/22 0500  Weight: 84.8 kg 83.3 kg 84.4 kg   10/5- will start Lasix 10 mg daily due to vascualar congestion on CXR  10/6- Per Cards, needs 40 mg daily- will change  10/7 weight appears stable, monitor 14: Hypothyroidism: continue Synthroid 125 mcg daily; follow-up TSH 15: Acute on chonic anemia: s/p 1 un PCs; follow-up CBC  -9/29 HGB stable at 8.2  10/2- Hb down to 7.3- will recheck in AM and make sure improving/stable  10/4- Hb up to 7.7- will monitor- recheck in AM 16: DM: CBGs q AC and q HS (A1c = 8%)             -9/29 Continue levemir 25u BID  -Add 2u novolog TID with meals             -continue SSI q AC  10/2- pt having variable BG's eating mainly food from  home.   10/3- ate breakfast tray this AM- BG's running from 56 (low yesterday) to 312 yesterday due to foods form home- will ask DM coordinator to help try and manage.   10/4- CBG's mid 100s to mid 200s- a little better in last 24 hours- no hypoglycemia  10/6- Dm coordinator - increased meals novolog 5 units TID with meals and night/QHS 0-5 SSI  10/7 increase Novolog to 7 units TID 17: Class 2 obesity: dietary counseling 18: Tobacco abuse: cessation counseling; continue nicotine patch 19: AKI atop CKD IV: trend BMP  -9/29 Cr improved to 1.88  10/2- Down to 1.59 and BUN 24- will push fluids and recheck in AM  10/3- Cr 1.64 and BUN 24- basically stable- will recheck Thursday-   10/4- ordered BMP for tomorrow  10/5-  Cr 2.12 up from 1.64- but required Lasix 40 mg PO x1- actually will start lasix 10 mg daily due to pulm/vascular congestion-   10/6- CR usually runs ~ 2 per Cards note-  20: CAD: stable; treated medically; Dr. Harl Bowie 21: Left foot osteomyelitis s/p ray amputation [name] -recent bone biopsy; cultures neg - Husband to bring in ?CAM boot for weightbearing, protection - Remove IJ order - 9/30 Noted surgical site dehiscence with PT ambulation, was not wearing orthotic. Podiatry consulted, imaging showing mildly angulated fracture of the distal fourth metatarsal (likely iatrogenic from prior amputation)               - Orders for betadyne dressing daily               - NWB LLE               - Appreciate podiatry recs, to discuss 4th MT fx as group next week  10/2- Podiatry says once has CAM boot to be worn- either from home or for [name] to order- d/w PA-  and will check in to it. Supposedly had a dehiscence but podiatry to see again today.   10/3- podiatry said they'd come back for possible bedside debridement- can WBAT now with CAM boot 22: Leukocytosis: trending down; follow-up CBC  -9/29 WBC 9.5 today, improved 23. Dysphagia  -Advanced to heart healthy/carb modified/ thin  liquids            - 9/30 - family requesting diet liberalization as patient primarily eating foods brought in from outside d/t hospital food bland; carb modified diet unable to be ordered with less restrictive parameters. Dietary consulted.   24. RSV  10/2- will take off Droplet precautions- no more Sx's.  25. Chest pain- in setting of severe cardiac issues including CHF with EF 35-40%, CAD- acute on chronic CHF  10/5- CP last night- required NTG x2- EKG had no changes- troponins elevated probably due to pulmonary congestion which was also found- will transfuse 1 unit to help CP; also restart her home Protonix for hx of reflux.  26, ABLA  10/5- will transfuse 1 unit- think the Hb 7.8 that came back 5 hours later is likely due to hemoconcentration, and isn't real, since her Hb has been trending down for days. On Lovenox and Brillenta- check Fecal occult for blood loss.  27. Insomnia  10/5- will increase trazodone to 75 mg QHS per pt request  10/6- slepeing better per pt.     LOS: 9 days A FACE TO FACE EVALUATION WAS PERFORMED  Jennye Boroughs 02/06/2022, 9:20 PM

## 2022-02-06 NOTE — Progress Notes (Signed)
Physical Therapy Session Note  Patient Details  Name: Lauren [name] MRN: [id number] Date of Birth: [birth year]  Today's Date: 02/06/2022 PT Individual Time: 6004-5997 PT Individual Time Calculation (min): 38 min   Short Term Goals: Week 1:  PT Short Term Goal 1 (Week 1): Pt will perform bed mobility with increased attn to LUE and consistent MinA. PT Short Term Goal 2 (Week 1): pt will perform sit<>stand with CGA and improved midline positioning. PT Short Term Goal 3 (Week 1): Pt will perform stand pivot transfers with consistent minA. PT Short Term Goal 4 (Week 1): Pt will initiate gait training using LRAD. PT Short Term Goal 5 (Week 1): Pt will initiate stair training. Week 2:    Week 3:     Skilled Therapeutic Interventions/Progress Updates:   Pt received sitting in WC and agreeable to PT  Orthostatic VS.   Sitting 115/66.  Standing. 85/50. Mild s/s Sitting 0 min 98/65.  Sitting 1 min 110/68.  PT applied thigh high ted to RLE and compression ace wrap to the LLE.  Reassessed Orthostatic VS.  Sitting 112/63.  Standing 95/61. Asymptomatic.  Throughout VS assessment min assist from PT for safety for balance in standing and attention to the LLE to engage quads.   Pt performed gait training with HHA on the L side and +2 for WC folllow. Performed x 60f and 254fwith mod assist and max cues for attention to the LLE to engage quads on the L side in standing. Pt maintained darco shoes throughout session on the LLE   [facility] returned to [facility] and all needs met.        Therapy Documentation Precautions:  Precautions Precautions: Fall Precaution Comments: L inattention, R gaze preference, L hemiplegia, no dressing to L foot - wear post-op shoe OOB Required Braces or Orthoses: Other Brace Other Brace: dressing on L foot Restrictions Weight Bearing Restrictions: No General:   Vital Signs: Therapy Vitals Temp: 97.8 F (36.6  C) Temp Source: Oral Pulse Rate: 89 Resp: 18 BP: 138/87 Patient Position (if appropriate): Sitting Oxygen Therapy SpO2: 100 % O2 Device: Room Air Pain: denies    Therapy/Group: Individual Therapy  [name]0/10/2021, 3:17 PM

## 2022-02-06 NOTE — Progress Notes (Signed)
   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  She was initially admitted under PCCM service, extubated on 01/18/2022 and transferred to [facility] on 01/21/2022.  Palliative care was asked to get involved to further address goals of care in the setting of a large stroke.   Today's Discussion 02/06/2022  *Please note that this is a verbal dictation therefore any spelling or grammatical errors are due to the "[place] One" system interpretation.  Chart reviewed inclusive of vital signs, progress notes, laboratory results, and diagnostic images.  Patient assessed at the bedside.  She is eating her lunch, denies pain or distress.  Her family is present visiting.  Created space and opportunity for patient's thoughts and feelings in her current illness.  She feels satisfied with her current care and her goals remain consistent for improved functional status and return home.  Explored whether she has any further palliative needs she cannot think of anything.  She is agreeable to PMT signing off at this time and will call back if further conversations would be beneficial.  Questions and concerns addressed. Palliative Support Provided.   Objective Assessment: Vital Signs Vitals:   02/06/22 0530 02/06/22 0715  BP: 133/69   Pulse: 78 78  Resp: 18 16  Temp: 97.8 F (36.6 C)   SpO2: 100%     Intake/Output Summary (Last 24 hours) at 02/06/2022 1029 Last data filed at 02/06/2022 0830 Gross per 24 hour  Intake 517 ml  Output --  Net 517 ml    Last Weight  Most recent update: 02/06/2022  6:11 AM    Weight  84.4 kg (186 lb 1.1 oz)            Gen: Adult Caucasian F in NAD CV: Regular  rate and rhythm  PULM: ON RA breathing is even and nonlabored ABD: soft/nontender  EXT: No edema Neuro: Alert and oriented x3 Psych: normal mood and affect   SUMMARY OF RECOMMENDATIONS   -Continue DNAR/DNI -Continue current care -Goals remain clear, hopeful for more functional improvement and patient is not ready for hospice at this time -PMT will sign off.  Family will call should needs arise.  Please reconsult as needed   MDM: High ______________________________________________________________________________________ Dorthy Cooler, PA-C [place] Team Team Cell Phone: [phone] Please utilize secure chat with additional questions, if there is no response within 30 minutes please call the above phone number  Palliative Medicine Team providers are available by phone from 7am to 7pm daily and can be reached through the team cell phone.  Should this patient require assistance outside of these hours, please call the patient's attending physician.

## 2022-02-06 NOTE — Progress Notes (Signed)
Occupational Therapy Session Note  Patient Details  Name: Lauren [name] MRN: [id number] Date of Birth: 1980/08/17  Today's Date: 02/06/2022 OT Individual Time: 1545-1630 OT Individual Time Calculation (min): 45 min    Short Term Goals: Week 2:  OT Short Term Goal 1 (Week 2): Pt will require set up and min cues for simple grooming sinkside using L hand as gross assist with min cus OT Short Term Goal 2 (Week 2): Pt will recall using hemi techniques for UB and LB clothing management with min cues OT Short Term Goal 3 (Week 2): Pt will consistently attend to L side during functional task 2/3 trials with mod cues OT Short Term Goal 4 (Week 2): Pt will transfer off and on toilet and tub transfer bench with CGA and cues to reduce impulsivity  Skilled Therapeutic Interventions/Progress Updates:    Upon OT arrival, pt resting in recliner with daughter present. Pt reports no pain and is agreeable to OT treatment. Treatment intervention with a focus on functional transfer, one handed tasks during IADLs, attention to the L side, scanning, and crossing midline. Pt completes stand step transfer from recliner to w/c with Min A secondary to decreased eccentric control. Pt was transported to dayroom gym via w/c and total A for time. While seated at tabletop pt folds laundry using the R UE. Pt with Mod difficulty and requires increased time to fold 3 wash cloths, 2 pillow cases, and 1 towel. Verbal cues required to attend to L side. Pt was transported to ortho gym via w/c and total A for time and engages in [place] board to improve visual perception. Pt performs visual scanning task tapping dots located on board using the R UE. Pt able to achieve 37 hits in 5 minutes with 7.68 second reaction time. Pt noted to require increased time when dots were on the L side of board but only requires 2 verbal cues during entire 5 minutes to scan to the L side. Pt then completes tracing activity on B visual fields tracing 5  different shapes with no difficulty. Pt was transported back to her room via w/c and total A for time and left in w/c at end of session with all needs met and daughter present.   Therapy Documentation Precautions:  Precautions Precautions: Fall Precaution Comments: L inattention, R gaze preference, L hemiplegia, no dressing to L foot - wear post-op shoe OOB Required Braces or Orthoses: Other Brace Other Brace: dressing on L foot Restrictions Weight Bearing Restrictions: No  Therapy/Group: Individual Therapy  Marvetta Gibbons 02/06/2022, 5:13 PM

## 2022-02-07 DIAGNOSIS — I10 Essential (primary) hypertension: Secondary | ICD-10-CM

## 2022-02-07 LAB — GLUCOSE, CAPILLARY
Glucose-Capillary: 128 mg/dL — ABNORMAL HIGH (ref 70–99)
Glucose-Capillary: 228 mg/dL — ABNORMAL HIGH (ref 70–99)
Glucose-Capillary: 195 mg/dL — ABNORMAL HIGH (ref 70–99)
Glucose-Capillary: 171 mg/dL — ABNORMAL HIGH (ref 70–99)

## 2022-02-07 NOTE — Progress Notes (Signed)
Patient CBG 449, on call Dr. Curlene Dolphin called and notified. 2 units of fast acting insulin ordered per MD, along with sliding scale of short acting insulin and 25 units of levemir. Patient in bed with call bell within reach.

## 2022-02-07 NOTE — Progress Notes (Signed)
Physical Therapy Weekly Progress Note  Patient Details  Name: Lauren [name] MRN: [id number] Date of Birth: [birth year]  Beginning of progress report period: January 29, 2022 End of progress report period: February 07, 2022   Patient has met 3 of 3 short term goals.  Pt is progressing toward long term goals. Pt limited due to orthostatic hypotension with mobility this week. Pt largely requires min A for transfers and left HHA for gait x 15 + 25 ft with close w/c follow. Plan to continue transfers and gait training and initiate stair training. Pt referred to recreational therapy and neuropsychologist during admission.   Patient continues to demonstrate the following deficits muscle weakness, decreased cardiorespiratoy endurance, impaired timing and sequencing, unbalanced muscle activation, ataxia, decreased coordination, and decreased motor planning, decreased midline orientation, decreased attention to left, and decreased motor planning, and decreased standing balance, decreased postural control, and decreased balance strategies and therefore will continue to benefit from skilled PT intervention to increase functional independence with mobility.  Patient progressing toward long term goals..  Continue plan of care.  PT Short Term Goals Week 1:  PT Short Term Goal 1 (Week 1): Pt will perform bed mobility with increased attn to LUE and consistent MinA. PT Short Term Goal 1 - Progress (Week 1): Met PT Short Term Goal 2 (Week 1): pt will perform sit<>stand with CGA and improved midline positioning. PT Short Term Goal 2 - Progress (Week 1): Met PT Short Term Goal 3 (Week 1): Pt will perform stand pivot transfers with consistent minA. PT Short Term Goal 3 - Progress (Week 1): Met PT Short Term Goal 4 (Week 1): Pt will initiate gait training using LRAD. PT Short Term Goal 4 - Progress (Week 1): Discontinued (comment) (not appropriate) PT Short Term Goal 5 (Week 1): Pt will initiate stair  training. PT Short Term Goal 5 - Progress (Week 1): Discontinued (comment) (not appropriate) Week 2:  PT Short Term Goal 1 (Week 2): Pt will ambulate 30 ft min A PT Short Term Goal 2 (Week 2): Pt will perform bed to chair transfer with CGA or less PT Short Term Goal 3 (Week 2): Pt will require min A for dynamic standing  Skilled Therapeutic Interventions/Progress Updates:      Therapy Documentation Precautions:  Precautions Precautions: Fall Precaution Comments: L inattention, R gaze preference, L hemiplegia, no dressing to L foot - wear post-op shoe OOB Required Braces or Orthoses: Other Brace Other Brace: dressing on L foot Restrictions Weight Bearing Restrictions: No     Therapy/Group: Individual Therapy  Verl Dicker Verl Dicker PT, DPT  02/07/2022, 12:49 PM

## 2022-02-07 NOTE — Progress Notes (Addendum)
PROGRESS NOTE   Subjective/Complaints:  She was noted to have hyperglycemia yesterday. Denies chest pain.   ROS:   Pt denies SOB, abd pain, CP, N/V/C/D, and vision changes    Objective:   No results found. Recent Labs    02/06/22 0626  WBC 7.6  HGB 8.5*  HCT 26.5*  PLT 425*    Recent Labs    02/05/22 1821 02/06/22 0626  NA 136 137  K 4.2 4.1  CL 101 105  CO2 24 22  GLUCOSE 394* 137*  BUN 31* 33*  CREATININE 2.32* 2.28*  CALCIUM 9.1 8.8*     Intake/Output Summary (Last 24 hours) at 02/07/2022 1703 Last data filed at 02/07/2022 0748 Gross per 24 hour  Intake 590 ml  Output --  Net 590 ml          Physical Exam: Vital Signs Blood pressure 120/79, pulse 88, temperature 97.8 F (36.6 C), resp. rate 17, weight 87.1 kg, last menstrual period 11/01/2021, SpO2 100 %.      General: awake, alert, appropriate, NAD, sitting in chair HENT: R  gaze preference; oropharynx moist CV: RRR no JVD Pulmonary: CTA B/L; no W/R/R- non-labored GI: soft, NT, ND, (+)BS Psychiatric: flat Neurological: awake and alert and oriented x4 L foot with dressing wrapped in ace wrap- C/D/I   PE from prior encounter: Skin: + L 5th toe amputation; wound site open with mild serous drainage, clean wound bed, no erythema or warmth.  Seen wrapped up in ACE wrap- C/D/I MSK:      No apparent deformity.      Strength:                RUE: 5/5 SA, 5/5 EF, 5/5 EE, 5/5 WE, 5/5 FF, 5/5 FA                 LUE: flaccid, 0/5 throughout                RLE: 5/5 HF, 5/5 KE, 5/5 DF, 5/5 EHL, 5/5 PF                 LLE:  3/5 HF, 2/5 KE, 0/5 DF, 0/5 EHL, 1/5 PF    Neurologic exam:  Cognition: AAO to person, place, time and event.  Language: Fluent, No substitutions or neoglisms.Moderate dysarthria Mood: Pleasant affect, appropriate mood.  Sensation: To light touch intact in BL UEs and LEs  CN: Left facial droop   Assessment/Plan: 1.  Functional deficits which require 3+ hours per day of interdisciplinary therapy in a comprehensive inpatient rehab setting. Physiatrist is providing close team supervision and 24 hour management of active medical problems listed below. Physiatrist and rehab team continue to assess barriers to discharge/monitor patient progress toward functional and medical goals  Care Tool:  Bathing    Body parts bathed by patient: Right arm, Left arm, Chest, Abdomen, Buttocks, Right upper leg, Left upper leg, Face   Body parts bathed by helper: Front perineal area, Right lower leg, Left lower leg     Bathing assist Assist Level: Moderate Assistance - Patient 50 - 74%     Upper Body Dressing/Undressing Upper body dressing   What  is the patient wearing?: Bra, Pull over shirt    Upper body assist Assist Level: Maximal Assistance - Patient 25 - 49%    Lower Body Dressing/Undressing Lower body dressing      What is the patient wearing?: Incontinence brief, Pants     Lower body assist Assist for lower body dressing: Maximal Assistance - Patient 25 - 49%     Toileting Toileting Toileting Activity did not occur (Clothing management and hygiene only): N/A (no void or bm)  Toileting assist Assist for toileting: Maximal Assistance - Patient 25 - 49%     Transfers Chair/bed transfer  Transfers assist     Chair/bed transfer assist level: Moderate Assistance - Patient 50 - 74%     Locomotion Ambulation   Ambulation assist   Ambulation activity did not occur: Safety/medical concerns  Assist level: Minimal Assistance - Patient > 75% Assistive device: Parallel bars Max distance: 30 ft   Walk 10 feet activity   Assist  Walk 10 feet activity did not occur: Safety/medical concerns  Assist level: Minimal Assistance - Patient > 75% Assistive device: Parallel bars   Walk 50 feet activity   Assist Walk 50 feet with 2 turns activity did not occur: Safety/medical concerns          Walk 150 feet activity   Assist Walk 150 feet activity did not occur: Safety/medical concerns         Walk 10 feet on uneven surface  activity   Assist Walk 10 feet on uneven surfaces activity did not occur: Safety/medical concerns         Wheelchair     Assist Is the patient using a wheelchair?: Yes Type of Wheelchair: Manual    Wheelchair assist level: Total Assistance - Patient < 25% Max wheelchair distance: 300 ft    Wheelchair 50 feet with 2 turns activity    Assist        Assist Level: Total Assistance - Patient < 25%   Wheelchair 150 feet activity     Assist      Assist Level: Total Assistance - Patient < 25%   Blood pressure 120/79, pulse 88, temperature 97.8 F (36.6 C), resp. rate 17, weight 87.1 kg, last menstrual period 11/01/2021, SpO2 100 %.  Medical Problem List and Plan: 1. 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             -patient may shower with lines and foot wound covered             -ELOS/Goals: 14-21 days  WBAT On LLE D/c 10/20 Con't CIR- PT and OT and SLP 2.  Antithrombotics: -DVT/anticoagulation:  Pharmaceutical: Lovenox             -antiplatelet therapy: Brilinta 3. Pain Management: Tylenol, oxycodone as needed 4. Mood/Behavior/Sleep: LCSW to evaluate and provide support             -continue melatonin 3 mg q HS             -antipsychotic agents: n/a 5. Neuropsych/cognition: This patient is capable of making decisions on her own behalf. 6. 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 Fluids/Electrolytes/Nutrition: Strict Is and Os and follow-up chemistries             -  dys 3/thin liquids; SLP eval             -repeat mag/phos tomorrow 8: Right ICA dissection with stent placement: on Brilinta 9: Hypertension:  monitor             -continue hydralazine 12.5 mg TID             -continue isosorbide 20 mg BID             -continue Lopressor 37.5 mg BID  10/2- BP is controlled- con't regimen  10/3- had BP of 80s/50s yesterday- hydralazine stopped- is better this AM- will see how does in therapy with standing- concern will drop  10/4- will decrease Metoprolol to 25 mg BID- not change to succinate, since won't have ability to change meds as often.   10/5- BP overnight 110s/120s supine- will see how tolerates therapy today.   10/6- BP doing better, they increased her B blocker back to 37.5 mg BID   10/8  BP well controlled 10: Hyperlipidemia: continue Lipitor 40 mg daily 11: Coagulopathy s/p RUE embolectomy 10/2021: Eliquis PTA now on Lovenox; f                -follow-up hematology as outpt 12: COPD exacerbation: hypoxia resolved; continue bronchodilators/nebs 13: Chronic diastolic and systolic heart failure:             -strict Is and Os and daily weight             -no diuretics currently             -see meds above   Filed Weights   02/05/22 0500 02/06/22 0500 02/07/22 0420  Weight: 83.3 kg 84.4 kg 87.1 kg   10/5- will start Lasix 10 mg daily due to vascualar congestion on CXR  10/6- Per Cards, needs 40 mg daily- will change  10/7 weight appears stable, monitor 14: Hypothyroidism: continue Synthroid 125 mcg daily; follow-up TSH 15: 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   10/3- ate breakfast tray this AM- BG's running from 56 (low yesterday) to 312 yesterday due to foods form home- will ask DM coordinator to help try and manage.    10/4- CBG's mid 100s to mid 200s- a little better in last 24 hours- no hypoglycemia  10/6- Dm coordinator - increased meals novolog 5 units TID with meals and night/QHS 0-5 SSI  10/7 increase Novolog to 7 units TID  10/8 CBGs elevated last night, a little better today, continue monitor response to insulin adjustment 17: Class 2 obesity: dietary counseling 18: Tobacco abuse: cessation counseling; continue nicotine patch 19: AKI atop CKD IV: trend BMP  -9/29 Cr improved to 1.88  10/2- Down to 1.59 and BUN 24- will push fluids and recheck in AM  10/3- Cr 1.64 and BUN 24- basically stable- will recheck Thursday-   10/4- ordered BMP for tomorrow  10/5- Cr 2.12 up from 1.64- but required Lasix  40 mg PO x1- actually will start lasix 10 mg daily due to pulm/vascular congestion-   10/6- CR usually runs ~ 2 per Cards note-   -10/8 Recheck BMET tomorrow 20: CAD: stable; treated medically; Dr. Harl Bowie 21: Left foot osteomyelitis s/p ray amputation [name] -recent bone biopsy; cultures neg - Husband to bring in ?CAM boot for weightbearing, protection - Remove IJ order - 9/30 Noted surgical site dehiscence with PT ambulation, was not wearing orthotic. Podiatry consulted, imaging showing mildly angulated fracture of the distal fourth metatarsal (likely iatrogenic from prior amputation)               - Orders for betadyne dressing daily               - NWB LLE               - Appreciate podiatry recs, to discuss 4th MT fx as group next week  10/2- Podiatry says once has CAM boot to be worn- either from home or for [name] to order- d/w PA-  and will check in to it. Supposedly had a dehiscence but podiatry to see again today.   10/3- podiatry said they'd come back for possible bedside debridement- can WBAT now with CAM boot 22: Leukocytosis: trending down; follow-up CBC  -9/29 WBC 9.5 today, improved 23. Dysphagia  -Advanced to heart healthy/carb modified/ thin liquids            - 9/30 - family  requesting diet liberalization as patient primarily eating foods brought in from outside d/t hospital food bland; carb modified diet unable to be ordered with less restrictive parameters. Dietary consulted.   24. RSV  10/2- will take off Droplet precautions- no more Sx's.  25. Chest pain- in setting of severe cardiac issues including CHF with EF 35-40%, CAD- acute on chronic CHF  10/5- CP last night- required NTG x2- EKG had no changes- troponins elevated probably due to pulmonary congestion which was also found- will transfuse 1 unit to help CP; also restart her home Protonix for hx of reflux.  26, ABLA  10/5- will transfuse 1 unit- think the Hb 7.8 that came back 5 hours later is likely due to hemoconcentration, and isn't real, since her Hb has been trending down for days. On Lovenox and Brillenta- check Fecal occult for blood loss.  27. Insomnia  10/5- will increase trazodone to 75 mg QHS per pt request  10/6- slepeing better per pt.     LOS: 10 days A FACE TO FACE EVALUATION WAS PERFORMED  Jennye Boroughs 02/07/2022, 5:03 PM

## 2022-02-08 ENCOUNTER — Inpatient Hospital Stay (HOSPITAL_COMMUNITY): Payer: No Typology Code available for payment source

## 2022-02-08 LAB — TYPE AND SCREEN
ABO/RH(D): A POS
Antibody Screen: POSITIVE
Donor AG Type: NEGATIVE
Donor AG Type: NEGATIVE
Unit division: 0
Unit division: 0

## 2022-02-08 LAB — GLUCOSE, CAPILLARY
Glucose-Capillary: 158 mg/dL — ABNORMAL HIGH (ref 70–99)
Glucose-Capillary: 146 mg/dL — ABNORMAL HIGH (ref 70–99)
Glucose-Capillary: 238 mg/dL — ABNORMAL HIGH (ref 70–99)
Glucose-Capillary: 258 mg/dL — ABNORMAL HIGH (ref 70–99)

## 2022-02-08 LAB — BPAM RBC
Blood Product Expiration Date: 202310202359
Blood Product Expiration Date: 202310262359
ISSUE DATE / TIME: 202310051642
Unit Type and Rh: 5100
Unit Type and Rh: 6200

## 2022-02-08 LAB — CBC
HCT: 28.1 % — ABNORMAL LOW (ref 36.0–46.0)
Hemoglobin: 8.8 g/dL — ABNORMAL LOW (ref 12.0–15.0)
MCH: 27.5 pg (ref 26.0–34.0)
MCHC: 31.3 g/dL (ref 30.0–36.0)
MCV: 87.8 fL (ref 80.0–100.0)
Platelets: 408 10*3/uL — ABNORMAL HIGH (ref 150–400)
RBC: 3.2 MIL/uL — ABNORMAL LOW (ref 3.87–5.11)
RDW: 16.1 % — ABNORMAL HIGH (ref 11.5–15.5)
WBC: 7.1 10*3/uL (ref 4.0–10.5)
nRBC: 0 % (ref 0.0–0.2)

## 2022-02-08 LAB — BASIC METABOLIC PANEL
Anion gap: 8 (ref 5–15)
BUN: 30 mg/dL — ABNORMAL HIGH (ref 6–20)
CO2: 25 mmol/L (ref 22–32)
Calcium: 8.8 mg/dL — ABNORMAL LOW (ref 8.9–10.3)
Chloride: 104 mmol/L (ref 98–111)
Creatinine, Ser: 2.16 mg/dL — ABNORMAL HIGH (ref 0.44–1.00)
GFR, Estimated: 29 mL/min — ABNORMAL LOW (ref >60)
Glucose, Bld: 147 mg/dL — ABNORMAL HIGH (ref 70–99)
Potassium: 3.6 mmol/L (ref 3.5–5.1)
Sodium: 137 mmol/L (ref 135–145)

## 2022-02-08 MED ORDER — ADULT MULTIVITAMIN W/MINERALS CH
1.0000 | ORAL_TABLET | Freq: Every day | ORAL | Status: DC
  Administered 2022-02-08 – 2022-02-18 (×6): 1 via ORAL
  Filled 2022-02-08 (×11): qty 1

## 2022-02-08 NOTE — Progress Notes (Signed)
Notified on call provider that patient was c/o of chest heaviness. Nitro given X3 with some relief. Rapid response nurse called and came to floor. EKG performed and was NSR and lung sounds clear placed in chart. On call provider notified/ Patient voiced some relief. VSS. Will continue to monitor

## 2022-02-08 NOTE — Significant Event (Signed)
Rapid Response Event Note   Reason for Call :  CP 4/10 despite ntg SL x 3  Initial Focused Assessment:  Pt sitting up in wheelchair with eyes open, in no visible distress. She is c/o 4/10 mid sternal chest pain/pressure. Per pt, pain has eased off a little bit since being given 3 sl ntg. Pt denies SOB. Lungs clear t/o. Skin cool to touch, dry.   HR-91, BP-112/57, RR-18, SpO2-100% on RA.   Pt seen by RRT on 10/4 for same presentation. At that time, fluid overload was thought to be the cause d/t PCXR results and lasix was given.   Interventions:  EKG-NSR, possible Inferior infarct , age undetermined Anterolateral infarct , age undetermined PCXR Plan of Care:  Await PCXR results and relay to MD. Continue to monitor pt closely. Call RRT if further assistance needed.   Event Summary:   MD Notified: Zella Ball, NP Call [place], Tysen Roesler Anderson, RN

## 2022-02-08 NOTE — Progress Notes (Signed)
Occupational Therapy Session Note  Patient Details  Name: Lauren [name] MRN: [id number] Date of Birth: 09/20/1980  Today's Date: 02/08/2022 OT Individual Time: 1045-1200 OT Individual Time Calculation (min): 75 min    Short Term Goals: Week 2:  OT Short Term Goal 1 (Week 2): Pt will require set up and min cues for simple grooming sinkside using L hand as gross assist with min cus OT Short Term Goal 2 (Week 2): Pt will recall using hemi techniques for UB and LB clothing management with min cues OT Short Term Goal 3 (Week 2): Pt will consistently attend to L side during functional task 2/3 trials with mod cues OT Short Term Goal 4 (Week 2): Pt will transfer off and on toilet and tub transfer bench with CGA and cues to reduce impulsivity  Skilled Therapeutic Interventions/Progress Updates:  Pt seen for skilled OT session with focus on self care and functional mobility retraining with neuro re-ed of L side integration addressed. Functional status as follows in prep for Team Conference tomorrow: mod A UB and LB bathing and dressing, min a grooming, mod a toileting, increased integration with cues for L UE as gross A, transfers to toilet and shower bench with min-mod a and cues d/t impulsivity. Pt also transported to day room gym via w/c and pt transferred off and on therapy mat with min A and cues to work in supine and sydelying for L UE NRE. Use of vibration through scap,sh, and distal jts as well as AAROM for scap and sh with greater than Trace noted but return suspected in feel of extremity from previous session. Once back to room, pt was taken by dtr in w/c downstairs and OT activated chair alarm belt.   Therapy Documentation Precautions:  Precautions Precautions: Fall Precaution Comments: L inattention, R gaze preference, L hemiplegia, no dressing to L foot - wear post-op shoe OOB Required Braces or Orthoses: Other Brace Other Brace: dressing on L foot Restrictions Weight Bearing  Restrictions: No    Therapy/Group: Individual Therapy  [name] 02/08/2022, 7:50 AM

## 2022-02-08 NOTE — Progress Notes (Signed)
Physical Therapy Session Note  Patient Details  Name: Lauren [name] MRN: [id number] Date of Birth: Sep 05, 1980  Today's Date: 02/08/2022 PT Individual Time: 0800-0845 PT Individual Time Calculation (min): 45 min   Short Term Goals: Week 1:  PT Short Term Goal 1 (Week 1): Pt will perform bed mobility with increased attn to LUE and consistent MinA. PT Short Term Goal 1 - Progress (Week 1): Met PT Short Term Goal 2 (Week 1): pt will perform sit<>stand with CGA and improved midline positioning. PT Short Term Goal 2 - Progress (Week 1): Met PT Short Term Goal 3 (Week 1): Pt will perform stand pivot transfers with consistent minA. PT Short Term Goal 3 - Progress (Week 1): Met PT Short Term Goal 4 (Week 1): Pt will initiate gait training using LRAD. PT Short Term Goal 4 - Progress (Week 1): Discontinued (comment) (not appropriate) PT Short Term Goal 5 (Week 1): Pt will initiate stair training. PT Short Term Goal 5 - Progress (Week 1): Discontinued (comment) (not appropriate)  Skilled Therapeutic Interventions/Progress Updates:    Pt received in recliner and agreeable to therapy.  No complaint of pain. Donned ted hose on RLE with min A and ace wrap and darco shoe on LLE with tot A. Pt then performed ambulatory transfer to w/c using wall on R side for support and mod HHA. Pt transported to therapy gym for time management and energy conservation. Pt then ambulated with hall rail and HHA x 20 ft before requesting to sit d/t L knee pain. Noted hyperextension in stance phase so donned swedish knee cage for support, in following trials, pt reported improved knee pain and demoed improving quad control. Pt reports concern about stairs at her home and requests to try them. Pt navigated 3" stairs x 8 with hand rail and HHA x 2 bouts. Pt ascending with RLE first bout then self selecting ascending with LLE on 2nd bout with good success. Descending with difficulty placing LLE on step and occ assist needed. Pt then  returned to room and remained in w/c, was left with all needs in reach and alarm active.   Therapy Documentation Precautions:  Precautions Precautions: Fall Precaution Comments: L inattention, R gaze preference, L hemiplegia, no dressing to L foot - wear post-op shoe OOB Required Braces or Orthoses: Other Brace Other Brace: dressing on L foot Restrictions Weight Bearing Restrictions: No General:      Therapy/Group: Individual Therapy  Mickel Fuchs 02/08/2022, 12:40 PM

## 2022-02-08 NOTE — Progress Notes (Signed)
Received call from nurse reporting, patient complaining of chest pressure. She received 3 NTG. No relief. Stat EKG, CK and Troponin ordered. Rapid response nurse will evaluate. Awaiting results and assessment. Vitals stable.

## 2022-02-08 NOTE — Progress Notes (Signed)
Patient resting comfortably in bed with spouse by side.  Patient voices no chest heaviness or pain  or discomfort at this time. Notified on call provider of  Zella Ball, NP and rapid response nurse about Chest X ray  result and made patient aware. Will continue to monitor

## 2022-02-08 NOTE — Progress Notes (Addendum)
Occupational Therapy Session Note  Patient Details  Name: Lauren [name] MRN: [id number] Date of Birth: 1980/06/07  Today's Date: 02/08/2022 OT Individual Time: 1415-1500 OT Individual Time Calculation (min): 45 min    Short Term Goals: Week 2:  OT Short Term Goal 1 (Week 2): Pt will require set up and min cues for simple grooming sinkside using L hand as gross assist with min cus OT Short Term Goal 2 (Week 2): Pt will recall using hemi techniques for UB and LB clothing management with min cues OT Short Term Goal 3 (Week 2): Pt will consistently attend to L side during functional task 2/3 trials with mod cues OT Short Term Goal 4 (Week 2): Pt will transfer off and on toilet and tub transfer bench with CGA and cues to reduce impulsivity  Skilled Therapeutic Interventions/Progress Updates:    Upon OT arrival, pt seated in w/c with daughter and daughter SO present reporting no pain and is agreeable to OT treatment. Pt requesting to use the bathroom. Treatment intervention with a focus on self care retraining, standing balance, and visual perception. Pt was transported to bathroom via w/c and total A for time and completes toilet transfer with Min A. Pt performs toileting with Min A for balance and returns to w/c with Min A performing stand step transfer.Pt was transported to sink to complete hand hygiene with Min A to wash L UE.  Pt was transported to ADL apartment and completes multiple sit<>stand transfers with Min A-CGA to stand at countertop with CGA to retrieve plates, bowls, and cups from cabinets using the R UE and places on countertop. Pt requires min verbal cues for foot placement and wider BOS to reduce risk of fall and pt demonstrates difficulty to motor plan moving feet in right place. Pt returns all items to their designated location and does require min verbal cues to scan to the L during activity. Pt requires 2 seated rest breaks to complete and was able to reach in all planes. Pt was  transported to ortho gym via w/c and total A for time and completes [employer]. Visual pursuits activity completed by tapping numbers in sequential order from 1-15 on a rotating surface. Pt with mod difficulty requiring min verbal cues to initiate and sequence. Pt took 3.26 min with a reaction time of 13.75 seconds and 93% accuracy. Pt states "I love this board". Pt was transported back to her room via w/c and total A and completes stand pivot transfer from w/c to bed with Min A. Pt was left in bed at end of session with all needs met, safety measures in place, and dietician in the room.   Therapy Documentation Precautions:  Precautions Precautions: Fall Precaution Comments: L inattention, R gaze preference, L hemiplegia, no dressing to L foot - wear post-op shoe OOB Required Braces or Orthoses: Other Brace Other Brace: dressing on L foot Restrictions Weight Bearing Restrictions: No    Therapy/Group: Individual Therapy  Marvetta Gibbons 02/08/2022, 4:49 PM

## 2022-02-08 NOTE — Progress Notes (Signed)
Speech Language Pathology Weekly Progress and Session Note  Patient Details  Name: Lauren [name] MRN: [id number] Date of Birth: [birth year]  Beginning of progress report period: January 29, 2022 End of progress report period: February 08, 2022  Today's Date: 02/08/2022 SLP Individual Time: 1315-1400 SLP Individual Time Calculation (min): 45 min and Today's Date: 02/08/2022 SLP Missed Time: 15 Minutes Missed Time Reason: Other (Comment) Patient was unaware of schedule change and was eating her lunch and visiting with her family.   Short Term Goals: Week 1: SLP Short Term Goal 1 (Week 1): Pt will demonstrate tolerance of regular diet textures with thin liquids with minimal to no s/sx concerning for airway compromise when given Sup A for implementation of safe swallowing strategies (small + single bites/sips, slow rate). SLP Short Term Goal 1 - Progress (Week 1): Met SLP Short Term Goal 2 (Week 1): Pt will demonstrate increased awareness of current deficits s/p CVA by naming 3 physical and/or 3 cognitive deficits given Min A verbal cues. SLP Short Term Goal 2 - Progress (Week 1): Met SLP Short Term Goal 3 (Week 1): Pt will recall 3 pieces of novel information following 5 minute delay given Min A verbal cues. SLP Short Term Goal 3 - Progress (Week 1): Not met SLP Short Term Goal 4 (Week 1): Pt will complete basic visual and verbal problem-solving tasks r/t iADL and ADL completion given Min A verbal cues. SLP Short Term Goal 4 - Progress (Week 1): Met SLP Short Term Goal 5 (Week 1): Pt will utilize visual scanning strategies to complete tasks targeting L inattention with 100% completion given Mod A verbal and visual cues. SLP Short Term Goal 5 - Progress (Week 1): Not met    New Short Term Goals: Week 2: SLP Short Term Goal 1 (Week 2): Pt will utilize visual scanning strategies to complete tasks targeting L inattention with 100% completion given Mod A verbal and visual cues. SLP Short Term  Goal 2 (Week 2): Pt will complete basic visual and verbal problem-solving tasks r/t iADL and ADL completion given supervision verbal cues. SLP Short Term Goal 3 (Week 2): Patient will self-monitor and correct errors during functional tasks with Mod A verbal cues. SLP Short Term Goal 4 (Week 2): Patient will demonstrate sustained attention to functional tasks for 30 minutes with Mod verbal cues for redirection.  Weekly Progress Updates: Patient has made functional gains and has met 3 of 5 STGs this reporting period. Currently, patient is consuming regular textures with thin liquids with minimal overt s/s of aspiration with supervision level verbal cues needed for use of swallowing compensatory strategies. Patient also requires overall Min-Mod A verbal cues to complete functional and familiar tasks safely in regards to sustained attention, functional problem solving, recall with use of strategies, emergent awareness and left visual scanning. Patient and family education ongoing. Patient would benefit from continued skilled SLP intervention to maximize her cognitive functioning and overall functional independence prior to discharge.      Intensity: Minumum of 1-2 x/day, 30 to 90 minutes Frequency: 3 to 5 out of 7 days Duration/Length of Stay: 02/19/22 Treatment/Interventions: Cognitive remediation/compensation;Environmental controls;Functional tasks;Internal/external aids;Patient/family education;Dysphagia/aspiration precaution training;Therapeutic Activities;Cueing hierarchy;Speech/Language facilitation   Daily Session  Skilled Therapeutic Interventions: Skilled treatment session focused on cognitive goals. SLP facilitated session by providing overall Mod A verbal and visual cues for visual scanning during a complex task of identifying medication administration errors. SLP also facilitated session by providing supervision level question cues for patient to identify 3  physical and 3 cognitive changes  since admission. SLP provided patient with a notebook to utilize to write down pertinent information from the day like time of visitors (as she is forgetting and often thinking they are still in the room), goals achieved, etc. SLP provided education with the patient's family regarding utilization. They verbalized understanding. Patient left upright in wheelchair with family present. Continue with current plan of care.       Pain No/Denies Pain   Therapy/Group: Individual Therapy  Krishna Dancel 02/08/2022, 3:29 PM

## 2022-02-08 NOTE — Progress Notes (Signed)
Nutrition Follow-up  DOCUMENTATION CODES:   Obesity unspecified  INTERVENTION:   Multivitamin w/ minerals daily Recommend pt to use own Dexcom and Omnipod insulin pump to better control DM, if unable to recommend consult to Diabetes Coordinator to better schedule insulin Menu reviewed with pt  NUTRITION DIAGNOSIS:   Other (Comment) (undesirable food choices) related to chronic illness (DM, CHF, HTN, CVA) as evidenced by other (comment) (pt requesting >80g CHO/meal). 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  Met with pt as she was finishing her therapy session. Discussed with pt current high CBGs, pt also expressed frustration and that nursing tries to over correct high doses of insulin. RD expressed understanding of T1DM and that insulin regimens in the acute setting do not match personal needs. Pt shares that she has a Dexcom and Omnipod insulin pump but removed upon arrival due to hospital usually making her take it off. RD and pt discussed if pt would be willing to manage her own insulin with pump during remainder of stay, pt was happy and eager to get her pump on. Discussed with MD, MD agreeable to pt using own pump to manage DM.  RD reviewed the menu with pt and that she has alternative available everyday as well. Pt reports that she has been eating ok and family has been bringing her in food as well.   Meal Documentation (10/6-10/9):  75-100% x 8 meals    Medications reviewed and include: Lasix, NovoLog SSI + 7 units, Levemir, Melatonin, Protonix Labs reviewed: Hgb A1c 8.0% (01/09/22), 24 hr CBGs 146-238  Diet Order:   Diet Order             Diet Carb Modified Fluid consistency: Thin; Room service appropriate? Yes with Assist  Diet effective now                   EDUCATION NEEDS:   No education needs have been identified at this time  Skin:  Skin Assessment: Reviewed RN Assessment  Last BM:  10/7  Height:   Ht Readings from Last 1 Encounters:  01/13/22 5' 5"  (1.651 m)    Weight:   Wt Readings from Last 1 Encounters:  02/07/22 87.1 kg    BMI:  Body mass index is 31.95 kg/m.  Estimated Nutritional Needs:   Kcal:  1700-2100kcal  Protein:  70-85g  Fluid:  1700-2155m    MHermina BartersRD, LDN Clinical Dietitian [facility] information.

## 2022-02-08 NOTE — Progress Notes (Signed)
PROGRESS NOTE   Subjective/Complaints: Continues to have shoulder pain, asks for sling, ordered She has no other complaints, she is eating her breakfast.  Labs stable this morning Nursing has no complaints.   ROS:   Pt denies SOB, abd pain, CP, N/V/C/D, and vision changes    Objective:   No results found. Recent Labs    02/06/22 0626 02/08/22 0552  WBC 7.6 7.1  HGB 8.5* 8.8*  HCT 26.5* 28.1*  PLT 425* 408*   Recent Labs    02/06/22 0626 02/08/22 0552  NA 137 137  K 4.1 3.6  CL 105 104  CO2 22 25  GLUCOSE 137* 147*  BUN 33* 30*  CREATININE 2.28* 2.16*  CALCIUM 8.8* 8.8*    Intake/Output Summary (Last 24 hours) at 02/08/2022 1109 Last data filed at 02/07/2022 2159 Gross per 24 hour  Intake 470 ml  Output --  Net 470 ml         Physical Exam: Vital Signs Blood pressure 115/72, pulse 83, temperature 98.8 F (37.1 C), temperature source Oral, resp. rate 16, weight 87.1 kg, last menstrual period 11/01/2021, SpO2 99 %.      General: awake, alert, appropriate, NAD, sitting in chair, BMI 31.95 HENT: R  gaze preference; oropharynx moist CV: RRR no JVD Pulmonary: CTA B/L; no W/R/R- non-labored GI: soft, NT, ND, (+)BS Psychiatric: flat Neurological: awake and alert and oriented x4 L foot with dressing wrapped in ace wrap- C/D/I   PE from prior encounter: Skin: + L 5th toe amputation; wound site open with mild serous drainage, clean wound bed, no erythema or warmth.  Seen wrapped up in ACE wrap- C/D/I MSK:      No apparent deformity.      Strength:                RUE: 5/5 SA, 5/5 EF, 5/5 EE, 5/5 WE, 5/5 FF, 5/5 FA                 LUE: flaccid, 0/5 throughout                RLE: 5/5 HF, 5/5 KE, 5/5 DF, 5/5 EHL, 5/5 PF                 LLE:  3/5 HF, 2/5 KE, 0/5 DF, 0/5 EHL, 1/5 PF    Neurologic exam:  Cognition: AAO to person, place, time and event.  Language: Fluent, No substitutions or  neoglisms.Moderate dysarthria Mood: Pleasant affect, appropriate mood.  Sensation: To light touch intact in BL UEs and LEs  CN: Left facial droop   Assessment/Plan: 1. Functional deficits which require 3+ hours per day of interdisciplinary therapy in a comprehensive inpatient rehab setting. Physiatrist is providing close team supervision and 24 hour management of active medical problems listed below. Physiatrist and rehab team continue to assess barriers to discharge/monitor patient progress toward functional and medical goals  Care Tool:  Bathing    Body parts bathed by patient: Right arm, Left arm, Chest, Abdomen, Buttocks, Right upper leg, Left upper leg, Face   Body parts bathed by helper: Front perineal area, Right lower leg, Left lower leg  Bathing assist Assist Level: Moderate Assistance - Patient 50 - 74%     Upper Body Dressing/Undressing Upper body dressing   What is the patient wearing?: Bra, Pull over shirt    Upper body assist Assist Level: Maximal Assistance - Patient 25 - 49%    Lower Body Dressing/Undressing Lower body dressing      What is the patient wearing?: Incontinence brief, Pants     Lower body assist Assist for lower body dressing: Maximal Assistance - Patient 25 - 49%     Toileting Toileting Toileting Activity did not occur (Clothing management and hygiene only): N/A (no void or bm)  Toileting assist Assist for toileting: Maximal Assistance - Patient 25 - 49%     Transfers Chair/bed transfer  Transfers assist     Chair/bed transfer assist level: Moderate Assistance - Patient 50 - 74%     Locomotion Ambulation   Ambulation assist   Ambulation activity did not occur: Safety/medical concerns  Assist level: Minimal Assistance - Patient > 75% Assistive device: Parallel bars Max distance: 30 ft   Walk 10 feet activity   Assist  Walk 10 feet activity did not occur: Safety/medical concerns  Assist level: Minimal Assistance -  Patient > 75% Assistive device: Parallel bars   Walk 50 feet activity   Assist Walk 50 feet with 2 turns activity did not occur: Safety/medical concerns         Walk 150 feet activity   Assist Walk 150 feet activity did not occur: Safety/medical concerns         Walk 10 feet on uneven surface  activity   Assist Walk 10 feet on uneven surfaces activity did not occur: Safety/medical concerns         Wheelchair     Assist Is the patient using a wheelchair?: Yes Type of Wheelchair: Manual    Wheelchair assist level: Total Assistance - Patient < 25% Max wheelchair distance: 300 ft    Wheelchair 50 feet with 2 turns activity    Assist        Assist Level: Total Assistance - Patient < 25%   Wheelchair 150 feet activity     Assist      Assist Level: Total Assistance - Patient < 25%   Blood pressure 115/72, pulse 83, temperature 98.8 F (37.1 C), temperature source Oral, resp. rate 16, weight 87.1 kg, last menstrual period 11/01/2021, SpO2 99 %.  Medical Problem List and Plan: 1. 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             -patient may shower with lines and foot wound covered             -ELOS/Goals: 14-21 days  WBAT On LLE D/c 10/20 Continue CIR- PT and OT and SLP 2.  Antithrombotics: -DVT/anticoagulation:  Pharmaceutical: Lovenox             -antiplatelet therapy: Brilinta 3. Pain Management: Tylenol, oxycodone as needed. Left arm sling ordered.  4. Mood/Behavior/Sleep: LCSW to evaluate and provide support             -continue melatonin 3 mg q HS             -antipsychotic agents: n/a 5. Neuropsych/cognition: This patient is capable of making decisions on her own behalf. 6. Skin/Wound Care - Hx L 5th digit amputation 08/2021 (see below): Routine skin care  checks           7.  Fluids/Electrolytes/Nutrition: Strict Is and Os and follow-up chemistries             -dys 3/thin liquids; SLP eval             -repeat mag/phos tomorrow 8: Right ICA dissection with stent placement: on Brilinta 9: Hypertension: monitor             -continue hydralazine 12.5 mg TID             -continue isosorbide 20 mg BID             -continue Lopressor 37.5 mg BID  10/2- BP is controlled- con't regimen  10/3- had BP of 80s/50s yesterday- hydralazine stopped- is better this AM- will see how does in therapy with standing- concern will drop  10/4- will decrease Metoprolol to 25 mg BID- not change to succinate, since won't have ability to change meds as often.   10/5- BP overnight 110s/120s supine- will see how tolerates therapy today.   10/6- BP doing better, they increased her B blocker back to 37.5 mg BID   10/8  BP well controlled 10: Hyperlipidemia: continue Lipitor 40 mg daily 11: Coagulopathy s/p RUE embolectomy 10/2021: Eliquis PTA now on Lovenox; f                -follow-up hematology as outpt 12: COPD exacerbation: hypoxia resolved; continue bronchodilators/nebs 13: Chronic diastolic and systolic heart failure:             -strict Is and Os and daily weight             -no diuretics currently             -see meds above   Filed Weights   02/05/22 0500 02/06/22 0500 02/07/22 0420  Weight: 83.3 kg 84.4 kg 87.1 kg   10/5- will start Lasix 10 mg daily due to vascualar congestion on CXR  10/6- Per Cards, needs 40 mg daily- will change  10/7 weight appears stable, monitor 14: Hypothyroidism: continue Synthroid 125 mcg daily; follow-up TSH 15: 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   10/3- ate breakfast tray this AM- BG's running from 56 (low yesterday) to 312 yesterday due to foods form home- will ask DM coordinator to help try and manage.   10/4- CBG's mid 100s to mid 200s- a little better in last 24 hours- no hypoglycemia  10/6- Dm coordinator - increased meals novolog 5 units TID with meals and night/QHS 0-5 SSI  10/7 increase Novolog to 7 units TID  10/8 CBGs elevated last night, a little better today, continue monitor response to insulin adjustment 17: Class 2 obesity: dietary counseling 18: Tobacco abuse: cessation counseling; continue nicotine patch 19: AKI atop CKD IV: trend BMP  -9/29 Cr improved to 1.88  10/2- Down to 1.59 and BUN 24- will push fluids and  recheck in AM  10/3- Cr 1.64 and BUN 24- basically stable- will recheck Thursday-   10/4- ordered BMP for tomorrow  10/5- Cr 2.12 up from 1.64- but required Lasix 40 mg PO x1- actually will start lasix 10 mg daily due to pulm/vascular congestion-   10/6- CR usually runs ~ 2 per Cards note-   -10/8 Recheck BMET tomorrow 20: CAD: stable; treated medically; Dr. Harl Bowie 21: Left foot osteomyelitis s/p ray amputation [name] -recent bone biopsy; cultures neg - Husband to bring in ?CAM boot for weightbearing, protection - Remove IJ order - 9/30 Noted surgical site dehiscence with PT ambulation, was not wearing orthotic. Podiatry consulted, imaging showing mildly angulated fracture of the distal fourth metatarsal (likely iatrogenic from prior amputation)               - Orders for betadyne dressing daily               - NWB LLE               - Appreciate podiatry recs, to discuss 4th MT fx as group next week  10/2- Podiatry says once has CAM boot to be worn- either from home or for [name] to order- d/w PA-  and will check in to it. Supposedly had a dehiscence but podiatry to see again today.   10/3- podiatry said they'd come back for possible  bedside debridement- can WBAT now with CAM boot 22: Leukocytosis: trending down; follow-up CBC  -9/29 WBC 9.5 today, improved 23. Dysphagia  -Advanced to heart healthy/carb modified/ thin liquids            - 9/30 - family requesting diet liberalization as patient primarily eating foods brought in from outside d/t hospital food bland; carb modified diet unable to be ordered with less restrictive parameters. Dietary consulted.   24. RSV  10/2- will take off Droplet precautions- no more Sx's.  25. Chest pain- in setting of severe cardiac issues including CHF with EF 35-40%, CAD- acute on chronic CHF  10/5- CP last night- required NTG x2- EKG had no changes- troponins elevated probably due to pulmonary congestion which was also found- will transfuse 1 unit to help CP; also restart her home Protonix for hx of reflux.  26, ABLA  10/5- will transfuse 1 unit- think the Hb 7.8 that came back 5 hours later is likely due to hemoconcentration, and isn't real, since her Hb has been trending down for days. On Lovenox and Brillenta- check Fecal occult for blood loss.  27. Insomnia  10/5- will increase trazodone to 75 mg QHS per pt request  10/6- slepeing better per pt.     LOS: 11 days A FACE TO FACE EVALUATION WAS PERFORMED  Marsha Hillman P Bailea Beed 02/08/2022, 11:09 AM

## 2022-02-09 DIAGNOSIS — L97529 Non-pressure chronic ulcer of other part of left foot with unspecified severity: Secondary | ICD-10-CM

## 2022-02-09 DIAGNOSIS — E11621 Type 2 diabetes mellitus with foot ulcer: Secondary | ICD-10-CM

## 2022-02-09 DIAGNOSIS — D649 Anemia, unspecified: Secondary | ICD-10-CM

## 2022-02-09 DIAGNOSIS — R079 Chest pain, unspecified: Secondary | ICD-10-CM

## 2022-02-09 DIAGNOSIS — N184 Chronic kidney disease, stage 4 (severe): Secondary | ICD-10-CM

## 2022-02-09 LAB — TROPONIN I (HIGH SENSITIVITY): Troponin I (High Sensitivity): 100 ng/L (ref <18)

## 2022-02-09 LAB — GLUCOSE, CAPILLARY
Glucose-Capillary: 229 mg/dL — ABNORMAL HIGH (ref 70–99)
Glucose-Capillary: 292 mg/dL — ABNORMAL HIGH (ref 70–99)
Glucose-Capillary: 125 mg/dL — ABNORMAL HIGH (ref 70–99)
Glucose-Capillary: 145 mg/dL — ABNORMAL HIGH (ref 70–99)

## 2022-02-09 MED ORDER — ATORVASTATIN CALCIUM 40 MG PO TABS
40.0000 mg | ORAL_TABLET | Freq: Every day | ORAL | Status: DC
  Administered 2022-02-10 – 2022-02-19 (×10): 40 mg via ORAL
  Filled 2022-02-09 (×10): qty 1

## 2022-02-09 MED ORDER — INSULIN DETEMIR 100 UNIT/ML ~~LOC~~ SOLN
27.0000 [IU] | Freq: Two times a day (BID) | SUBCUTANEOUS | Status: DC
  Administered 2022-02-09 – 2022-02-10 (×2): 27 [IU] via SUBCUTANEOUS
  Filled 2022-02-09 (×4): qty 0.27

## 2022-02-09 NOTE — Progress Notes (Signed)
Occupational Therapy Session Note  Patient Details  Name: Lauren [name] MRN: [id number] Date of Birth: [birth year]  Today's Date: 02/09/2022 OT Individual Time: 1030-1130 OT Individual Time Calculation (min): 60 min    Short Term Goals: Week 2:  OT Short Term Goal 1 (Week 2): Pt will require set up and min cues for simple grooming sinkside using L hand as gross assist with min cus OT Short Term Goal 2 (Week 2): Pt will recall using hemi techniques for UB and LB clothing management with min cues OT Short Term Goal 3 (Week 2): Pt will consistently attend to L side during functional task 2/3 trials with mod cues OT Short Term Goal 4 (Week 2): Pt will transfer off and on toilet and tub transfer bench with CGA and cues to reduce impulsivity  Skilled Therapeutic Interventions/Progress Updates:  Pt seen for OT following being released from medical hold due to cardiac issues late last night and some reports of SOB and chest pain. No issues throughout session with light standing level ADL's and simple IADL reaching tasks at sink and counter top with + VSR and no SOB reported. Pt was able to stand 5 intervals of 3 min with min A and L sided facilitation and cues to attend and place UE and LE correctly and safely. Pt completed oral and hair care with min a then taken to demo apartment in w/c and stood to reach for items crossing midline with R UE to and from L side. Pt does now have L UE sling she requested from MD but OT reinforced use of the laptray as much as possible so as not to take her L UE out of the view nor tasks at hand to facilitate recovery and pt agreed. Improved overall edema to L UE but brief retrograde massage performed with self massage training ongoing. OT completed MSW sticky note for final OT DME requests in prep for d/c next week for items including:w/c with full desk arms and L sided UE 1/2 lap tray, RW, 3 in 1 bedside comode; transfer tub/shower bench,  HHOT recommended. Pt left up in  w/c at end of session with chair alarm engaged, needs and nurse call button in reach.    Therapy Documentation Precautions:  Precautions Precautions: Fall Precaution Comments: L inattention, R gaze preference, L hemiplegia, no dressing to L foot - wear post-op shoe OOB Required Braces or Orthoses: Other Brace Other Brace: dressing on L foot Restrictions Weight Bearing Restrictions: No   Therapy/Group: Individual Therapy  [name] 02/09/2022, 7:44 AM

## 2022-02-09 NOTE — Plan of Care (Signed)
All cognitive goals downgraded to Min-Mod A due to slower than anticipated progress.  Problem: RH Swallowing Goal: LTG Patient will consume least restrictive diet using compensatory strategies with assistance (SLP) Description: LTG:  Patient will consume least restrictive diet using compensatory strategies with assistance (SLP) Flowsheets (Taken 01/29/2022 1312) LTG: Pt Patient will consume least restrictive diet using compensatory strategies with assistance of (SLP): Modified Independent   Problem: RH Cognition - SLP Goal: RH LTG Patient will demonstrate orientation with cues Description:  LTG:  Patient will demonstrate orientation to person/place/time/situation with cues (SLP)   Flowsheets Taken 02/09/2022 1144 LTG: Patient will demonstrate orientation using cueing (SLP): Minimal Assistance - Patient > 75% Taken 01/29/2022 1312 LTG Patient will demonstrate orientation to: Time   Problem: RH Problem Solving Goal: LTG Patient will demonstrate problem solving for (SLP) Description: LTG:  Patient will demonstrate problem solving for basic/complex daily situations with cues  (SLP) Flowsheets Taken 02/09/2022 1144 LTG Patient will demonstrate problem solving for:  Minimal Assistance - Patient > 75%  Moderate Assistance - Patient 50 - 74% Taken 01/29/2022 1312 LTG: Patient will demonstrate problem solving for (SLP): Basic daily situations   Problem: RH Memory Goal: LTG Patient will demonstrate ability for day to day (SLP) Description: LTG:   Patient will demonstrate ability for day to day recall/carryover during cognitive/linguistic activities with assist  (SLP) Flowsheets Taken 02/09/2022 1144 LTG: Patient will demonstrate ability for day to day recall/carryover during cognitive/linguistic activities with assist (SLP):  Minimal Assistance - Patient > 75%  Moderate Assistance - Patient 50 - 74% Taken 01/29/2022 1312 LTG: Patient will demonstrate ability for day to day recall:  New  information  Daily complex information Goal: LTG Patient will use memory compensatory aids to (SLP) Description: LTG:  Patient will use memory compensatory aids to recall biographical/new, daily complex information with cues (SLP) Flowsheets (Taken 02/09/2022 1144) LTG: Patient will use memory compensatory aids to (SLP):  Minimal Assistance - Patient > 75%  Moderate Assistance - Patient 50 - 74%   Problem: RH Attention Goal: LTG Patient will demonstrate this level of attention during functional activites (SLP) Description: LTG:  Patient will will demonstrate this level of attention during functional activites (SLP) Flowsheets Taken 02/09/2022 1144 Patient will demonstrate this level of attention during cognitive/linguistic activities in: Controlled LTG: Patient will demonstrate this level of attention during cognitive/linguistic activities with assistance of (SLP): (Min-Mod A) Other (Comment) Taken 01/29/2022 1312 Patient will demonstrate during cognitive/linguistic activities the attention type of:  Selective  Sustained   Problem: RH Awareness Goal: LTG: Patient will demonstrate awareness during functional activites type of (SLP) Description: LTG: Patient will demonstrate awareness during functional activites type of (SLP) Flowsheets Taken 02/09/2022 1144 LTG: Patient will demonstrate awareness during cognitive/linguistic activities with assistance of (SLP): (Min-Mod A) Other (Comment) Taken 01/29/2022 1312 Patient will demonstrate during cognitive/linguistic activities awareness type of: Emergent

## 2022-02-09 NOTE — Progress Notes (Signed)
Patient ID: Lauren [name], female   DOB: [birth year], 41 y.o.   MRN: [id number]  Met with pt and spoke with husband via telephone to discuss team conference progress this week toward her goals of CGA-Min level and discharge still 10/20.Pt feels like she is doing better and hoping this will continue. Husband is building the ramp for home. Discussed family education he will talk with daughter and get back with this worker regarding day and time. Also discussed equipment needs and how the wheelchair or walker is covered not both and the tub bench is not covered. He will see if one of his family members has one and get back with worker. Will await return call from husband once he talks with their daughter.

## 2022-02-09 NOTE — Inpatient Diabetes Management (Signed)
Inpatient Diabetes Program Recommendations  AACE/ADA: New Consensus Statement on Inpatient Glycemic Control (2015)  Target Ranges:  Prepandial:   less than 140 mg/dL      Peak postprandial:   less than 180 mg/dL (1-2 hours)      Critically ill patients:  140 - 180 mg/dL   Lab Results  Component Value Date   GLUCAP 292 (H) 02/09/2022   HGBA1C 8.0 (H) 01/09/2022    Latest Reference Range & Units 02/08/22 05:45 02/08/22 11:59 02/08/22 16:24 02/08/22 21:28 02/09/22 06:13 02/09/22 11:27  Glucose-Capillary 70 - 99 mg/dL 158 (H)  Novolog 11 units  Levemir 25 units 146 (H)  Novolog 10 units 238 (H)  Novolog 14 units 258 (H)  Novolog 3 units  Levemir 25 units 229 (H)  Novolog 14 units  Levemir 25 units 292 (H)  Novolog 18 units    DM1  (does not make insulin.  Needs correction, basal and meal coverage)   Outpatient Diabetes medications:  Per Whitney with [facility]-last settings on 9/10  Basal rate 12a-12p = 0.9 units/hr--total 24 hr basal 21.6 units Insulin: Carb ratio= 1 unit/10 g  Correction Sensitivity= 38 mg/dL  Target BG= 120  Active time= 4 hrs    Current orders for Inpatient glycemic control:  Levemir 25 units BID Novolog 0-20 units tid Novolog 2 units tid meal coverage  Note: Glucose trends increase in response to Carbohydrate intake. Pt requiring at least 10 of Novolog at each meal time.  Inpatient Diabetes Program Recommendations:     -   Increase Novolog meal coverage to Novolog 10 units  -   May need to decrease Levemir back down to  25 units bid  Thanks,  Tama Headings RN, MSN, BC-ADM Inpatient Diabetes Coordinator Team Pager [phone] (8a-5p)

## 2022-02-09 NOTE — Progress Notes (Signed)
Physical Therapy Note  Patient Details  Name: Lauren [name] MRN: [id number] Date of Birth: 03-01-1981 Today's Date: 02/09/2022    Pt changed to 15/7 due to changes in medical status    Tanja Port PT, DPT  02/09/2022, 12:10 PM

## 2022-02-09 NOTE — Progress Notes (Signed)
PROGRESS NOTE   Subjective/Complaints: Rapid response yesterday for chest pain. CP resolved last night. She denies any new complaints or concerns this AM.   ROS:   Pt denies SOB, abd pain, CP, N/V/C/D, and vision changes    Objective:   DG CHEST PORT 1 VIEW  Result Date: 02/08/2022 CLINICAL DATA:  Chest heaviness. EXAM: PORTABLE CHEST 1 VIEW COMPARISON:  Radiograph 02/03/2022, additional priors reviewed. FINDINGS: Persistent low lung volumes. Stable heart size and mediastinal contours. Decreasing pulmonary edema from prior exam. No focal airspace disease. No significant pleural effusion. No pneumothorax. IMPRESSION: Persistent low lung volumes. Decreasing pulmonary edema from prior exam. Electronically Signed   By: Keith Rake M.D.   On: 02/08/2022 23:16   Recent Labs    02/08/22 0552  WBC 7.1  HGB 8.8*  HCT 28.1*  PLT 408*    Recent Labs    02/08/22 0552  NA 137  K 3.6  CL 104  CO2 25  GLUCOSE 147*  BUN 30*  CREATININE 2.16*  CALCIUM 8.8*     Intake/Output Summary (Last 24 hours) at 02/09/2022 1139 Last data filed at 02/09/2022 0800 Gross per 24 hour  Intake 592 ml  Output --  Net 592 ml          Physical Exam: Vital Signs Blood pressure 110/64, pulse 90, temperature 97.9 F (36.6 C), resp. rate 18, weight 85.9 kg, last menstrual period 11/01/2021, SpO2 100 %.      General: awake, alert, appropriate, NAD, sitting in chair, BMI 31.95 HENT: R  gaze preference; oropharynx moist CV: RRR no JVD Pulmonary: CTA B/L; no W/R/R- non-labored GI: soft, NT, ND, (+)BS Psychiatric: appropriate, normal affect Neurological: awake and alert and oriented x4 L foot with dressing wrapped in ace wrap- C/D/I   PE from prior encounter: Skin: + L 5th toe amputation; wound site open with mild serous drainage, clean wound bed, no erythema or warmth.  Seen wrapped up in ACE wrap- C/D/I MSK:      No apparent  deformity.      Strength:                RUE: 5/5 SA, 5/5 EF, 5/5 EE, 5/5 WE, 5/5 FF, 5/5 FA                 LUE: flaccid, 0/5 throughout                RLE: 5/5 HF, 5/5 KE, 5/5 DF, 5/5 EHL, 5/5 PF                 LLE:  3/5 HF, 2/5 KE, 0/5 DF, 0/5 EHL, 1/5 PF    Neurologic exam:  Cognition: AAO to person, place, time and event.  Language: Fluent, No substitutions or neoglisms.Moderate dysarthria Mood: Pleasant affect, appropriate mood.  Sensation: To light touch intact in BL UEs and LEs  CN: Left facial droop   Assessment/Plan: 1. Functional deficits which require 3+ hours per day of interdisciplinary therapy in a comprehensive inpatient rehab setting. Physiatrist is providing close team supervision and 24 hour management of active medical problems listed below. Physiatrist and rehab team continue to assess barriers to  discharge/monitor patient progress toward functional and medical goals  Care Tool:  Bathing    Body parts bathed by patient: Right arm, Left arm, Chest, Abdomen, Buttocks, Right upper leg, Left upper leg, Face, Front perineal area, Right lower leg   Body parts bathed by helper: Front perineal area, Right lower leg, Left lower leg Body parts n/a: Left lower leg   Bathing assist Assist Level: Moderate Assistance - Patient 50 - 74%     Upper Body Dressing/Undressing Upper body dressing   What is the patient wearing?: Bra, Pull over shirt    Upper body assist Assist Level: Moderate Assistance - Patient 50 - 74%    Lower Body Dressing/Undressing Lower body dressing      What is the patient wearing?: Incontinence brief, Pants     Lower body assist Assist for lower body dressing: Moderate Assistance - Patient 50 - 74%     Toileting Toileting Toileting Activity did not occur (Clothing management and hygiene only): N/A (no void or bm)  Toileting assist Assist for toileting: Minimal Assistance - Patient > 75%     Transfers Chair/bed transfer  Transfers  assist     Chair/bed transfer assist level: Moderate Assistance - Patient 50 - 74%     Locomotion Ambulation   Ambulation assist   Ambulation activity did not occur: Safety/medical concerns  Assist level: Minimal Assistance - Patient > 75% Assistive device: Parallel bars Max distance: 30 ft   Walk 10 feet activity   Assist  Walk 10 feet activity did not occur: Safety/medical concerns  Assist level: Minimal Assistance - Patient > 75% Assistive device: Parallel bars   Walk 50 feet activity   Assist Walk 50 feet with 2 turns activity did not occur: Safety/medical concerns         Walk 150 feet activity   Assist Walk 150 feet activity did not occur: Safety/medical concerns         Walk 10 feet on uneven surface  activity   Assist Walk 10 feet on uneven surfaces activity did not occur: Safety/medical concerns         Wheelchair     Assist Is the patient using a wheelchair?: Yes Type of Wheelchair: Manual    Wheelchair assist level: Total Assistance - Patient < 25% Max wheelchair distance: 300 ft    Wheelchair 50 feet with 2 turns activity    Assist        Assist Level: Total Assistance - Patient < 25%   Wheelchair 150 feet activity     Assist      Assist Level: Total Assistance - Patient < 25%   Blood pressure 110/64, pulse 90, temperature 97.9 F (36.6 C), resp. rate 18, weight 85.9 kg, last menstrual period 11/01/2021, SpO2 100 %.  Medical Problem List and Plan: 1. 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             -patient may shower with lines and foot wound covered             -ELOS/Goals: 14-21 days  WBAT On LLE D/c 10/20 Continue CIR- PT and OT and SLP -She as changed to 15/7 -Team conference today 2.  Antithrombotics: -DVT/anticoagulation:  Pharmaceutical: Lovenox              -antiplatelet therapy: Brilinta 3. Pain Management: Tylenol, oxycodone as needed. Left arm sling ordered.  4. Mood/Behavior/Sleep: LCSW to evaluate and provide support             -continue melatonin 3 mg q HS             -antipsychotic agents: n/a 5. Neuropsych/cognition: This patient is capable of making decisions on her own behalf. 6. 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 Fluids/Electrolytes/Nutrition: Strict Is and Os and follow-up chemistries             -dys 3/thin liquids; SLP eval             -repeat mag/phos tomorrow 8: Right ICA dissection with stent placement: on Brilinta 9: Hypertension: monitor             -continue hydralazine 12.5 mg TID             -continue isosorbide 20 mg BID             -continue Lopressor 37.5 mg BID  10/2- BP is controlled- con't regimen  10/3- had BP of 80s/50s yesterday- hydralazine stopped- is better this AM- will see how does in therapy with standing- concern will drop  10/4- will decrease Metoprolol to 25 mg BID- not change to succinate, since won't have ability to change meds as often.   10/5- BP overnight 110s/120s supine- will see how tolerates therapy today.   10/6- BP doing better, they increased her B blocker back to 37.5 mg BID   10/8  BP well controlled 10: Hyperlipidemia: continue Lipitor 40 mg daily 11: Coagulopathy s/p RUE embolectomy 10/2021: Eliquis PTA now on Lovenox; f                -follow-up hematology as outpt 12: COPD exacerbation: hypoxia resolved; continue bronchodilators/nebs 13: Chronic diastolic and systolic heart failure:             -strict Is and Os and daily weight             -no diuretics currently             -see meds above   Filed Weights   02/06/22 0500 02/07/22 0420 02/09/22 0500  Weight: 84.4 kg 87.1 kg 85.9 kg   10/5- will start Lasix 10 mg daily due to vascualar congestion on CXR  10/6- Per Cards, needs 40 mg daily- will  change  10/7 weight appears stable, monitor 14: Hypothyroidism: continue Synthroid 125 mcg daily; follow-up TSH 15: 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   10/3- ate breakfast tray this AM- BG's running from 56 (low yesterday) to 312 yesterday due to foods form home- will ask DM coordinator to help try and manage.   10/4- CBG's mid 100s to mid 200s- a little better in last 24 hours- no hypoglycemia  10/6- Dm coordinator - increased meals novolog 5 units TID with meals and night/QHS 0-5 SSI  10/7 increase Novolog to 7 units TID  10/8 CBGs elevated last night, a little better today, continue monitor response to insulin adjustment  10/10 increase levemir to 27u 17: Class 2 obesity: dietary counseling 18: Tobacco abuse: cessation counseling; continue nicotine patch 19: AKI atop CKD IV: trend BMP  -9/29 Cr improved to 1.88  10/2- Down to 1.59 and BUN 24- will push fluids and recheck in AM  10/3- Cr 1.64 and BUN 24- basically stable- will recheck Thursday-   10/4- ordered BMP for tomorrow  10/5- Cr 2.12 up from 1.64- but required Lasix 40 mg PO x1- actually will start lasix 10 mg daily due to pulm/vascular congestion-   10/6- CR usually runs ~ 2 per Cards note-   -10/10 CR stable at 2.16 20: CAD: stable; treated medically; Dr. Harl Bowie 21: Left foot osteomyelitis s/p ray amputation [name] -recent bone biopsy; cultures neg - Husband to bring in ?CAM boot for weightbearing, protection - Remove IJ order - 9/30 Noted surgical site dehiscence with PT ambulation, was not wearing orthotic. Podiatry consulted, imaging showing mildly angulated fracture of the  distal fourth metatarsal (likely iatrogenic from prior amputation)               - Orders for betadyne dressing daily               - NWB LLE               - Appreciate podiatry recs, to discuss 4th MT fx as group next week  10/2- Podiatry says once has CAM boot to be worn- either from home or for [name] to order- d/w PA-  and will check in to it. Supposedly had a dehiscence but podiatry to see again today.   10/3- podiatry said they'd come back for possible bedside debridement- can WBAT now with CAM boot 22: Leukocytosis: trending down; follow-up CBC  -9/29 WBC 9.5 today, improved 23. Dysphagia  -Advanced to heart healthy/carb modified/ thin liquids            - 9/30 - family requesting diet liberalization as patient primarily eating foods brought in from outside d/t hospital food bland; carb modified diet unable to be ordered with less restrictive parameters. Dietary consulted.   24. RSV  10/2- will take off Droplet precautions- no more Sx's.  25. Chest pain- in setting of severe cardiac issues including CHF with EF 35-40%, CAD- acute on chronic CHF  10/5- CP last night- required NTG x2- EKG had no changes- troponins elevated probably due to pulmonary congestion which was also found- will transfuse 1 unit to help CP; also restart her home Protonix for hx of reflux.  10/10 CP last night, resolved with NTG, troponins not increased above baseline 26, ABLA  10/5- will transfuse 1 unit- think the Hb 7.8 that came back 5 hours later is likely due to hemoconcentration, and isn't real, since her Hb has been trending down for days. On Lovenox and Brillenta- check Fecal occult for blood loss.  27. Insomnia  10/5- will increase trazodone to 75 mg QHS per pt request  10/6- slepeing better per pt.     LOS: 12 days A FACE TO FACE EVALUATION WAS PERFORMED  Jennye Boroughs 02/09/2022, 11:39 AM

## 2022-02-09 NOTE — Patient Care Conference (Signed)
Inpatient RehabilitationTeam Conference and Plan of Care Update Date: 02/09/2022   Time: 11:38 AM    Patient Name: Lauren [name]      Medical Record Number: [id number]  Date of Birth: 21-Sep-1980 Sex: Female         Room/Bed: 4W05C/4W05C-01 Payor Info: Payor: [place] EM[employer]OY[employer] / Plan: [place] FOCUS / Product Type: *No Product type* /    Admit Date/Time:  01/28/2022  2:40 PM  Primary Diagnosis:  Acute right MCA stroke [facility])  Hospital Problems: Principal Problem:   Acute right MCA stroke ([place]) Active Problems:   Primary hypertension   Chronic combined systolic and diastolic heart failure (HCC)   Hypotension   Ischemic cardiomyopathy   Acute on chronic combined systolic and diastolic CHF (congestive heart failure) ([place])   Type 2 diabetes mellitus with hyperglycemia, without long-term current use of insulin [facility])    Expected Discharge Date: Expected Discharge Date: 02/19/22  Team Members Present: Physician leading conference: Dr. Jennye Boroughs Social Worker Present: Ovidio Kin, LCSW Nurse Present: Other (comment) Tacy Learn, RN) PT Present: Other (comment) Verl Dicker, PT) OT Present: Cherylynn Ridges, OT SLP Present: Helaine Chess, SLP PPS Coordinator present : Gunnar Fusi, SLP     Current Status/Progress Goal Weekly Team Focus  Bowel/Bladder   Incontinemt of B&B, with episode of continence, Lbm 02/08/22  continemt of B&b  Assess bowel and bladder q shift and prn   Swallow/Nutrition/ Hydration   Regular textures with thin liquids, Intermittent supervision-Mod I  Mod I  use of swallowing compensatory strategies   ADL's   mod A UB and LB bathing and dressing, min a grooming, mod a toileting, increased integration with cues for L UE as gross A, transfers to toilet and shower bench with min-mod a and cues d/t impulsivity  min a  IncreaseBADL's to consistent min A and cues with hemi techniques, progress transfers to toilet and shower seat to min A  consistently, increase standing tolerance and L UE weightbearing   Mobility   CGA/min bed mobility, CGA STS, Mod A stand pivot, HHA x 20 ft gait and 3 " steps x 8 ( 2 bouts) with HHA and HR  CGA  transfers, gait, stairs, hemi-management   Communication             Safety/Cognition/ Behavioral Observations  Mod A  supervision A  functional problem solving, visual scanning/attention to left, emergent awareness   Pain   4 out 10  remain free of pain  Assess pain q shift and prn and give prn pain medications and scheduled meds as orderd   Skin   Surgical incision left fifth toe  Remain free of infection  Assess skin q shift and provided treatment as ordereed     Discharge Planning:  Husband and daughter have been here to observe in therapies and husband has assisted some. Seen by neuro-psych for coping. Work on family education with husband and daughter   Team Discussion: Right MCA/CVA. Continent B/B. Pain managed with scheduled medications and PRNs. 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 Patient compliant with left post op shoe. WBAT. Chest pain last night, labs and EKG completed. Therapies moved to 15/7 to see if can tolerate better. Adjusting diabetic medications.   Patient on target to meet rehab goals: yes, HHA 20 ft, husband building ramp  *See Care Plan and progress notes for long and short-term goals.   Revisions to Treatment Plan:  Medication adjustments, therapy adjustment  Teaching  Needs: Medications, safety, gait/transfer training, skin/wound care, etc  Current Barriers to Discharge: Decreased caregiver support and Wound care  Possible Resolutions to Barriers: Family education, order recommended DME     Medical Summary Current Status: HTN,  anemia, AKI, DM, heart failure,Left foot osteomyelitis  Barriers to Discharge: Medical stability;Home enviroment access/layout  Barriers to Discharge Comments: HTN,  anemia, AKI, DM, heart failure,Left  foot osteomyelitis Possible Resolutions to [employer] Focus: 15/7 today, monitor glucose and adjust insulin, monitor renal function   Continued Need for Acute Rehabilitation Level of Care: The patient requires daily medical management by a physician with specialized training in physical medicine and rehabilitation for the following reasons: Direction of a multidisciplinary physical rehabilitation program to maximize functional independence : Yes Medical management of patient stability for increased activity during participation in an intensive rehabilitation regime.: Yes Analysis of laboratory values and/or radiology reports with any subsequent need for medication adjustment and/or medical intervention. : Yes   I attest that I was present, lead the team conference, and concur with the assessment and plan of the team.   Ernest Pine 02/09/2022, 4:05 PM

## 2022-02-09 NOTE — Progress Notes (Signed)
Physical Therapy Session Note  Patient Details  Name: Lauren [name] MRN: [id number] Date of Birth: [birth year]  Today's Date: 02/09/2022 PT Missed Time: 52 Minutes Missed Time Reason: MD hold (Comment) (HF)  Short Term Goals: Week 2:  PT Short Term Goal 1 (Week 2): Pt will ambulate 30 ft min A PT Short Term Goal 2 (Week 2): Pt will perform bed to chair transfer with CGA or less PT Short Term Goal 3 (Week 2): Pt will require min A for dynamic standing  Skilled Therapeutic Interventions/Progress Updates:      Therapy Documentation Precautions:  Precautions Precautions: Fall Precaution Comments: L inattention, R gaze preference, L hemiplegia, no dressing to L foot - wear post-op shoe OOB Required Braces or Orthoses: Other Brace Other Brace: dressing on L foot Restrictions Weight Bearing Restrictions: No  Pt missed 60 minutes of skilled PT due to MD hold as pt presents with chest pain and elevated troponins.    Therapy/Group: Individual Therapy  Verl Dicker Verl Dicker PT, DPT  02/09/2022, 7:48 AM

## 2022-02-09 NOTE — Progress Notes (Signed)
Labs was reviewed, Troponin results not noted. Call placed to [facility] . This provider note was reviewed with Lauren [name]. Lauren [name] placed Stat Troponin ordered .

## 2022-02-09 NOTE — Progress Notes (Signed)
  Subjective:  Patient ID: Lauren [name], female DOB: 1981-03-07, MRN: [id number]  Patient seen at bedside with her family present as well.  Says therapy is doing well Negative for chest pain and shortness of breath  Fever: no  Constitutional signs: no  Objective:       Vitals:   02/02/22 1921 02/03/22 0404  BP: 130/77 (!) 117/57  Pulse: 93 (!) 101  Resp: 18 18  Temp: 98 F (36.7 C) 98.1 F (36.7 C)  SpO2: 100% 100%   General AA&O x3. Normal mood and affect.  Vascular Weakly palpable DP PT pulse. Foot is warm   Neurologic Epicritic sensation grossly intact.  Dermatologic Persistent left foot ulceration   with fibrogranular wound bed. Some improvement in dimensions since my last examination of her  Orthopedic: Motor function is intact but has weakness in the left lower extremity    Assessment & Plan:  Patient was evaluated and treated and all questions answered.  Diabetic ulcer of left foot  -Change dressing daily with Iodosorb and gauze dressings  -She may be WBAT to the left lower extremity in surgical shoe -We will continue to follow weekly  Lauren [name], DPM   Accessible via secure chat for questions or concerns.

## 2022-02-09 NOTE — Progress Notes (Signed)
Physical Therapy Session Note  Patient Details  Name: Takerra Lupinacci MRN: [id number] Date of Birth: [birth year]  Today's Date: 02/09/2022 PT Individual Time: 1415-1445 PT Individual Time Calculation (min): 30 min   Short Term Goals: Week 2:  PT Short Term Goal 1 (Week 2): Pt will ambulate 30 ft min A PT Short Term Goal 2 (Week 2): Pt will perform bed to chair transfer with CGA or less PT Short Term Goal 3 (Week 2): Pt will require min A for dynamic standing  Skilled Therapeutic Interventions/Progress Updates:      Pt no longer on bedrest - LPN reports patient is appropriate for therapy session. Pt sitting in w/c to start. NT at bedside taking routine vitals - WNL with slightly soft BP - 102/72, pt asymptomatic. No TEDs or compression socks on.   Pt denies any pain. Transported in w/c to day room rehab and wheeled to high/low table. Worked on L sided awareness with peg board task - required mod/max cues for locating and scanning to the L. Decreased error recognition with recreated peg board puzzle but becomes aware when prompted. Pt distracted by gym environment, also inhibiting performance.   Returned patient to her room and she requested to return to bed due to [place]. Stand<>pivot minA transfer with use of hospital bed rail, towards her weaker L side. MaxA required for sit>supine for BLE management. Cues needed for repositioning and awareness of L hemibody. Alarm on, all needs met.    Therapy Documentation Precautions:  Precautions Precautions: Fall Precaution Comments: L inattention, R gaze preference, L hemiplegia, no dressing to L foot - wear post-op shoe OOB Required Braces or Orthoses: Other Brace Other Brace: dressing on L foot Restrictions Weight Bearing Restrictions: No General:    Therapy/Group: Individual Therapy  Alger Simons 02/09/2022, 7:51 AM

## 2022-02-09 NOTE — Progress Notes (Signed)
Speech Language Pathology Daily Session Note  Patient Details  Name: Lauren [name] MRN: [id number] Date of Birth: October 22, 1980  Today's Date: 02/09/2022 SLP Individual Time: 2458-0998 SLP Individual Time Calculation (min): 30 min and Today's Date: 02/09/2022 SLP Missed Time: 15 Minutes Missed Time Reason: Other (Comment) (clarifying medical hold status with nurse and MD)  Short Term Goals: Week 2: SLP Short Term Goal 1 (Week 2): Pt will utilize visual scanning strategies to complete tasks targeting L inattention with 100% completion given Mod A verbal and visual cues. SLP Short Term Goal 2 (Week 2): Pt will complete basic visual and verbal problem-solving tasks r/Lauren iADL and ADL completion given supervision verbal cues. SLP Short Term Goal 3 (Week 2): Patient will self-monitor and correct errors during functional tasks with Mod A verbal cues. SLP Short Term Goal 4 (Week 2): Patient will demonstrate sustained attention to functional tasks for 30 minutes with Mod verbal cues for redirection.  Skilled Therapeutic Interventions: Skilled ST treatment focused on cognitive goals. Pt missed initial 15 minutes of treatment due to discussion with care team and clarifying medical hold status. Pt in agreement with skilled ST intervention. Denied any active discomfort, endorsed sleepiness. SLP provided reinforcement on visual scanning strategies, compensatory memory, and attention strategies. Pt verbalized understanding through teach back and various examples of memory strategies in use (i.e., notes app on phone). SLP facilitated sustained attention, working memory, and visual scanning using sequential memory task via the [place]. Pt completed with overall 83% accuracy with mod A verbal/visual cues for recall and scanning left of midline. Pt was pleasantly surprised by accuracy and stated "I though I'd do worse than that." Pt sustained attention to BITS for 15 minute duration within a mildly distracting environment  with min A verbal redirection cues. Pt appeared to require increase redirection with more unstructured tasks as compared to structured environment and tasks. Pt was left in wheelchair with alarm activated and immediate needs within reach at end of session. Continue per current plan of care.       Pain Pain Assessment Pain Scale: 0-10 Pain Score: 0-No pain  Therapy/Group: Individual Therapy  Lauren [name] Lauren [name] 02/09/2022, 11:00 AM

## 2022-02-09 NOTE — Progress Notes (Signed)
Spoke to on call provider  Tama. NP and verbal order for Troponin lab STAT, order initiated. Will continue to monitor and notified on call provider with result.

## 2022-02-09 NOTE — Progress Notes (Signed)
Notified by Lab about patient's Troponin and made on call provider aware, Zella Ball, Np. Also made Glory Rosebush, PA aware and  writer  made aware that providers will follow up this shift. Patient in chair with spouse by side and voices no discomfort at this time. Will ccntinue to monitor

## 2022-02-10 LAB — GLUCOSE, CAPILLARY
Glucose-Capillary: 221 mg/dL — ABNORMAL HIGH (ref 70–99)
Glucose-Capillary: 272 mg/dL — ABNORMAL HIGH (ref 70–99)
Glucose-Capillary: 312 mg/dL — ABNORMAL HIGH (ref 70–99)
Glucose-Capillary: 178 mg/dL — ABNORMAL HIGH (ref 70–99)

## 2022-02-10 MED ORDER — INSULIN DETEMIR 100 UNIT/ML ~~LOC~~ SOLN
25.0000 [IU] | Freq: Two times a day (BID) | SUBCUTANEOUS | Status: DC
  Administered 2022-02-10 – 2022-02-14 (×8): 25 [IU] via SUBCUTANEOUS
  Filled 2022-02-10 (×9): qty 0.25

## 2022-02-10 MED ORDER — INSULIN ASPART 100 UNIT/ML IJ SOLN
10.0000 [IU] | Freq: Three times a day (TID) | INTRAMUSCULAR | Status: DC
  Administered 2022-02-10 – 2022-02-16 (×17): 10 [IU] via SUBCUTANEOUS

## 2022-02-10 NOTE — Progress Notes (Signed)
Physical Therapy Session Note  Patient Details  Name: Lauren [name] MRN: [id number] Date of Birth: [birth year]  Today's Date: 02/10/2022 PT Individual Time: 1116-1204 PT Individual Time Calculation (min): 48 min   Short Term Goals: Week 2:  PT Short Term Goal 1 (Week 2): Pt will ambulate 30 ft min A PT Short Term Goal 2 (Week 2): Pt will perform bed to chair transfer with CGA or less PT Short Term Goal 3 (Week 2): Pt will require min A for dynamic standing  Skilled Therapeutic Interventions/Progress Updates:      Therapy Documentation Precautions:  Precautions Precautions: Fall Precaution Comments: L inattention, R gaze preference, L hemiplegia, no dressing to L foot - wear post-op shoe OOB Required Braces or Orthoses: Other Brace Other Brace: dressing on L foot Restrictions Weight Bearing Restrictions: No   Pt received seated in w/c at bedside, declines pain, and agreeable to PT session with emphasis on gait training. PT dependently donned ted hose to R LE. Pt transported by w/c for time management and energy conservation to main gym. Pt ambulated 30 ft with R hand rail and L HHA min A for balance with one episode of loss of balance that required physical assist for recovery. Pt reports increased knee pain and PT provided flexibility exercise and mobilization to joint and pt reported improvement.  PT acquired material to set-up left saddle splint for RW and pt required mod A to ambulate 5 ft x 2. Pt presents with narrow base of support and decreased L LE foot clearance and requires verbal and tactile cueing. Pt transported to room and left seated in w/c at bedside with chair alarm on and all needs within reach.    Therapy/Group: Individual Therapy  Verl Dicker Verl Dicker PT, DPT  02/10/2022, 7:49 AM

## 2022-02-10 NOTE — Progress Notes (Addendum)
Occupational Therapy Session Note  Patient Details  Name: Lauren [name] MRN: [id number] Date of Birth: [birth year]  Today's Date: 02/10/2022 OT Individual Time: 0277-4128, 7867-6720 OT Individual Time Calculation (min): 60 min 45 min    Short Term Goals: Week 2:  OT Short Term Goal 1 (Week 2): Pt will require set up and min cues for simple grooming sinkside using L hand as gross assist with min cus OT Short Term Goal 2 (Week 2): Pt will recall using hemi techniques for UB and LB clothing management with min cues OT Short Term Goal 3 (Week 2): Pt will consistently attend to L side during functional task 2/3 trials with mod cues OT Short Term Goal 4 (Week 2): Pt will transfer off and on toilet and tub transfer bench with CGA and cues to reduce impulsivity  Skilled Therapeutic Interventions/Progress Updates:   1st Session: Pt resting still in bed upon OT arrival. Mild impulsivity to move from hob elevated supine to EOB with mod cues for rolling, Le management and rail use. Pt transferred bed to w/c, w/c to toilet, toilet to w/c, w/c to and from tub transfer bench all with min A but mod cues to ensure proper hand, foot and overall body organization/placement for safe and slowed transfer via SPT. Pt continues to demonstrate impulsivity and if L foot is not placed out and under with proper footing.  Assist to cover L LE wound but improved ability to cross B legs up for LB self care. Cues to integrated LH sponge and required overall min A and mod cues  UB/LB bathing and UB dressing with pullover head bra and tshirt and mod a for LB pul on pants, incontinence brief, ortho shoe and slipper sock on R with min a to support standing with RW for steadiness. Grooming with min a with cues for hemi techniques. Pt was able to use hair dryer with R UE and OT called NT due to time constraints to complete hair and w/c set up for am meal. Left session with pt in the care of NT.   2nd Session:  Pt in w/c upon OT  arrival. OT assisted with transfer w/c to recliner for change in position and R LE extension as pt reported stiffness behind knee and pt tends to cross. OT applied URIAS stroke recovery air splint to L UE. Trial facilitation for scap, sh AAROM and SROM n10 reps in all planes with limited motor recruitment noted but increased visual contact to L UE.Marland Kitchen Pt provided with 2 warm blankets for added inputs and placed L UE on blanket roll for proper weightbearing and positioning. Left pt with recliner elevated, locked and chair exit alarm, needs and call button in place.   Therapy Documentation Precautions:  Precautions Precautions: Fall Precaution Comments: L inattention, R gaze preference, L hemiplegia, no dressing to L foot - wear post-op shoe OOB Required Braces or Orthoses: Other Brace Other Brace: dressing on L foot Restrictions Weight Bearing Restrictions: No    Therapy/Group: Individual Therapy  [name] 02/10/2022, 7:52 AM

## 2022-02-10 NOTE — Progress Notes (Addendum)
PROGRESS NOTE   Subjective/Complaints: Denies further chest pain.  She was seen by Podiatry yesterday.   ROS:   Pt denies SOB, abd pain, HA, CP, N/V/C/D, and vision changes    Objective:   DG CHEST PORT 1 VIEW  Result Date: 02/08/2022 CLINICAL DATA:  Chest heaviness. EXAM: PORTABLE CHEST 1 VIEW COMPARISON:  Radiograph 02/03/2022, additional priors reviewed. FINDINGS: Persistent low lung volumes. Stable heart size and mediastinal contours. Decreasing pulmonary edema from prior exam. No focal airspace disease. No significant pleural effusion. No pneumothorax. IMPRESSION: Persistent low lung volumes. Decreasing pulmonary edema from prior exam. Electronically Signed   By: Keith Rake M.D.   On: 02/08/2022 23:16    Recent Labs    02/08/22 0552  WBC 7.1  HGB 8.8*  HCT 28.1*  PLT 408*    Recent Labs    02/08/22 0552  NA 137  K 3.6  CL 104  CO2 25  GLUCOSE 147*  BUN 30*  CREATININE 2.16*  CALCIUM 8.8*     Intake/Output Summary (Last 24 hours) at 02/10/2022 1324 Last data filed at 02/10/2022 1006 Gross per 24 hour  Intake 236 ml  Output 450 ml  Net -214 ml          Physical Exam: Vital Signs Blood pressure 119/60, pulse 85, temperature 97.7 F (36.5 C), resp. rate 18, weight 86.1 kg, last menstrual period 11/01/2021, SpO2 100 %.      General: awake, alert, appropriate, NAD, sitting in chair, BMI 31.95 HENT: NAD, [place], AT, R  gaze preference; oropharynx moist CV: RRR no JVD Pulmonary: CTA B/L; no W/R/R- non-labored GI: soft, NT, ND, (+)BS Psychiatric: appropriate, normal affect Neurological: awake and alert and oriented x4 L foot with dressing wrapped in ace wrap- C/D/I   PE from prior encounter: Skin: + L 5th toe amputation; wound site open with mild serous drainage, clean wound bed, no erythema or warmth.  Seen wrapped up in ACE wrap- C/D/I MSK:      No apparent deformity.      Strength:                 RUE: 5/5 SA, 5/5 EF, 5/5 EE, 5/5 WE, 5/5 FF, 5/5 FA                 LUE: flaccid, 0/5 throughout                RLE: 5/5 HF, 5/5 KE, 5/5 DF, 5/5 EHL, 5/5 PF                 LLE:  3/5 HF, 2/5 KE, 0/5 DF, 0/5 EHL, 1/5 PF    Neurologic exam:  Cognition: AAO to person, place, time and event.  Language: Fluent, No substitutions or neoglisms.Moderate dysarthria Mood: Pleasant affect, appropriate mood.  Sensation: To light touch intact in BL UEs and LEs  CN: Left facial droop   Assessment/Plan: 1. Functional deficits which require 3+ hours per day of interdisciplinary therapy in a comprehensive inpatient rehab setting. Physiatrist is providing close team supervision and 24 hour management of active medical problems listed below. Physiatrist and rehab team continue to assess barriers to discharge/monitor patient  progress toward functional and medical goals  Care Tool:  Bathing    Body parts bathed by patient: Right arm, Left arm, Chest, Abdomen, Buttocks, Right upper leg, Left upper leg, Face, Front perineal area, Right lower leg   Body parts bathed by helper: Front perineal area, Right lower leg, Left lower leg Body parts n/a: Left lower leg   Bathing assist Assist Level: Moderate Assistance - Patient 50 - 74%     Upper Body Dressing/Undressing Upper body dressing   What is the patient wearing?: Bra, Pull over shirt    Upper body assist Assist Level: Moderate Assistance - Patient 50 - 74%    Lower Body Dressing/Undressing Lower body dressing      What is the patient wearing?: Incontinence brief, Pants     Lower body assist Assist for lower body dressing: Moderate Assistance - Patient 50 - 74%     Toileting Toileting Toileting Activity did not occur (Clothing management and hygiene only): N/A (no void or bm)  Toileting assist Assist for toileting: Minimal Assistance - Patient > 75%     Transfers Chair/bed transfer  Transfers assist     Chair/bed transfer  assist level: Moderate Assistance - Patient 50 - 74%     Locomotion Ambulation   Ambulation assist   Ambulation activity did not occur: Safety/medical concerns  Assist level: Minimal Assistance - Patient > 75% Assistive device: Other (comment) (HHA and R rail) Max distance: 30 ft   Walk 10 feet activity   Assist  Walk 10 feet activity did not occur: Safety/medical concerns  Assist level: Minimal Assistance - Patient > 75% Assistive device: Parallel bars   Walk 50 feet activity   Assist Walk 50 feet with 2 turns activity did not occur: Safety/medical concerns         Walk 150 feet activity   Assist Walk 150 feet activity did not occur: Safety/medical concerns         Walk 10 feet on uneven surface  activity   Assist Walk 10 feet on uneven surfaces activity did not occur: Safety/medical concerns         Wheelchair     Assist Is the patient using a wheelchair?: Yes Type of Wheelchair: Manual    Wheelchair assist level: Total Assistance - Patient < 25% Max wheelchair distance: 300 ft    Wheelchair 50 feet with 2 turns activity    Assist        Assist Level: Total Assistance - Patient < 25%   Wheelchair 150 feet activity     Assist      Assist Level: Total Assistance - Patient < 25%   Blood pressure 119/60, pulse 85, temperature 97.7 F (36.5 C), resp. rate 18, weight 86.1 kg, last menstrual period 11/01/2021, SpO2 100 %.  Medical Problem List and Plan: 1. 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             -patient may shower with lines and foot wound covered             -ELOS/Goals: 14-21 days  WBAT On LLE D/c 10/20 Continue CIR- PT and OT and SLP -She as changed to 15/7 -Team conference today 2.  Antithrombotics: -DVT/anticoagulation:  Pharmaceutical: Lovenox              -antiplatelet therapy: Brilinta 3. Pain Management: Tylenol, oxycodone as needed. Left arm  sling ordered.  4. Mood/Behavior/Sleep: LCSW to evaluate and provide support             -continue melatonin 3 mg q HS             -antipsychotic agents: n/a 5. Neuropsych/cognition: This patient is capable of making decisions on her own behalf. 6. 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 Fluids/Electrolytes/Nutrition: Strict Is and Os and follow-up chemistries             -dys 3/thin liquids; SLP eval             -repeat mag/phos tomorrow 8: Right ICA dissection with stent placement: on Brilinta 9: Hypertension: monitor             -continue hydralazine 12.5 mg TID             -continue isosorbide 20 mg BID             -continue Lopressor 37.5 mg BID  10/2- BP is controlled- con't regimen  10/3- had BP of 80s/50s yesterday- hydralazine stopped- is better this AM- will see how does in therapy with standing- concern will drop  10/4- will decrease Metoprolol to 25 mg BID- not change to succinate, since won't have ability to change meds as often.   10/5- BP overnight 110s/120s supine- will see how tolerates therapy today.   10/6- BP doing better, they increased her B blocker back to 37.5 mg BID   10/11 well controlled, continue to monitor    02/10/2022    5:17 AM 02/09/2022    7:36 PM 02/09/2022    2:22 PM  Vitals with BMI  Weight 189 lbs 13 oz    BMI 82.99    Systolic [phone]  Diastolic 60 81 72  Pulse 85 91 80    10: Hyperlipidemia: continue Lipitor 40 mg daily 11: Coagulopathy s/p RUE embolectomy 10/2021: Eliquis PTA now on Lovenox; f                -follow-up hematology as outpt 12: COPD exacerbation: hypoxia resolved; continue bronchodilators/nebs 13: Chronic diastolic and systolic heart failure:             -strict Is and Os and daily weight             -no diuretics currently             -see meds above   Filed  Weights   02/07/22 0420 02/09/22 0500 02/10/22 0517  Weight: 87.1 kg 85.9 kg 86.1 kg   10/5- will start Lasix 10 mg daily due to vascualar congestion on CXR  10/6- Per Cards, needs 40 mg daily- will change  10/7 weight appears stable, monitor 14: Hypothyroidism: continue Synthroid 125 mcg daily; follow-up TSH 15: 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   10/3- ate breakfast tray  this AM- BG's running from 56 (low yesterday) to 312 yesterday due to foods form home- will ask DM coordinator to help try and manage.   10/4- CBG's mid 100s to mid 200s- a little better in last 24 hours- no hypoglycemia  10/6- Dm coordinator - increased meals novolog 5 units TID with meals and night/QHS 0-5 SSI  10/7 increase Novolog to 7 units TID  10/8 CBGs elevated last night, a little better today, continue monitor response to insulin adjustment  10/10 increase levemir to 27u  10/10 DM coordinator recommending 10 U novolog TID meal coverage and 25 U levemir BID, will make these changes  17: Class 2 obesity: dietary counseling 18: Tobacco abuse: cessation counseling; continue nicotine patch 19: AKI atop CKD IV: trend BMP  -9/29 Cr improved to 1.88  10/2- Down to 1.59 and BUN 24- will push fluids and recheck in AM  10/3- Cr 1.64 and BUN 24- basically stable- will recheck Thursday-   10/4- ordered BMP for tomorrow  10/5- Cr 2.12 up from 1.64- but required Lasix 40 mg PO x1- actually will start lasix 10 mg daily due to pulm/vascular congestion-   10/6- CR usually runs ~ 2 per Cards note-   -10/10 CR stable at 2.16 20: CAD: stable; treated medically; Dr. Harl Bowie 21: Left foot  osteomyelitis s/p ray amputation [name] -recent bone biopsy; cultures neg - Husband to bring in ?CAM boot for weightbearing, protection - Remove IJ order - 9/30 Noted surgical site dehiscence with PT ambulation, was not wearing orthotic. Podiatry consulted, imaging showing mildly angulated fracture of the distal fourth metatarsal (likely iatrogenic from prior amputation)               - Orders for betadyne dressing daily               - NWB LLE               - Appreciate podiatry recs, to discuss 4th MT fx as group next week  10/2- Podiatry says once has CAM boot to be worn- either from home or for [name] to order- d/w PA-  and will check in to it. Supposedly had a dehiscence but podiatry to see again today.   10/3- podiatry said they'd come back for possible bedside debridement- can WBAT now with CAM boot  10/11 Seen by Podiatry for L foot ulcer,  Iodosorb and gauze dressings daily, WBAT LLE in surgical shoe 22: Leukocytosis: trending down; follow-up CBC  -WBC down to 7/1 on 10/9 23. Dysphagia  -Advanced to heart healthy/carb modified/ thin liquids            - 9/30 - family requesting diet liberalization as patient primarily eating foods brought in from outside d/t hospital food bland; carb modified diet unable to be ordered with less restrictive parameters. Dietary consulted.   24. RSV  10/2- will take off Droplet precautions- no more Sx's.  25. Chest pain- in setting of severe cardiac issues including CHF with EF 35-40%, CAD- acute on chronic CHF  10/5- CP last night- required NTG x2- EKG had no changes- troponins elevated probably due to pulmonary congestion which was also found- will transfuse 1 unit to help CP; also restart her home Protonix for hx of reflux.  10/10 CP last night, resolved with NTG, troponins not increased above baseline 26, ABLA  10/5- will transfuse 1 unit- think the Hb 7.8 that came back 5 hours later is likely due to hemoconcentration, and isn't real,  since her Hb  has been trending down for days. On Lovenox and Brillenta- check Fecal occult for blood loss.  27. Insomnia  10/5- will increase trazodone to 75 mg QHS per pt request  10/6- slepeing better per pt.     LOS: 13 days A FACE TO FACE EVALUATION WAS PERFORMED  Jennye Boroughs 02/10/2022, 1:24 PM

## 2022-02-11 DIAGNOSIS — D62 Acute posthemorrhagic anemia: Secondary | ICD-10-CM

## 2022-02-11 DIAGNOSIS — G4700 Insomnia, unspecified: Secondary | ICD-10-CM

## 2022-02-11 LAB — GLUCOSE, CAPILLARY
Glucose-Capillary: 132 mg/dL — ABNORMAL HIGH (ref 70–99)
Glucose-Capillary: 207 mg/dL — ABNORMAL HIGH (ref 70–99)
Glucose-Capillary: 151 mg/dL — ABNORMAL HIGH (ref 70–99)
Glucose-Capillary: 102 mg/dL — ABNORMAL HIGH (ref 70–99)

## 2022-02-11 LAB — HEPARIN ANTI-XA: Heparin LMW: 1.12 IU/mL

## 2022-02-11 MED ORDER — TRAZODONE HCL 50 MG PO TABS
100.0000 mg | ORAL_TABLET | Freq: Every day | ORAL | Status: DC
  Administered 2022-02-11: 100 mg via ORAL
  Filled 2022-02-11: qty 2

## 2022-02-11 NOTE — Group Note (Signed)
Patient Details Name: Laycee Fitzsimmons MRN: [id number] DOB: 20-Jan-1981 Today's Date: 02/11/2022  Time Calculation: OT Group Time Calculation OT Group Start Time: 2820 OT Group Stop Time: 8138 OT Group Time Calculation (min): 15 min      Group Description: [employer]: Patient participated in [employer] with both OT and SLP with focus on self-feeding, dysphagia goals, cognitive goals, and appropriate social interaction within a group environment.   Individual level documentation: Patient participated in diner's club and required mod assist for functional use of left UE during self-feeding.   Patient participated in [employer] club with focus on dysphagia goals. Patient consumed their lunch meal of regular textures and thin liquids. Patient consumed meal without overt s/s of aspiration and required supervision assist for use of swallowing compensatory strategies. Recommend patient continue current diet.   Patient participated in [employer] club with focus on cognitive goals. Patient required (drop down selection min assist for selective attention in a mildly distracting environment. Patient also required min assist for visual scanning to left field of environment. Patient also demonstrated appropriate social interaction within a group environment with overall mod I assist.   Pain: Pain Assessment Pain Scale: 0-10 Pain Score: 0-No pain  Precautions:     Willeen Cass [facility] 02/11/2022, 1:21 PM

## 2022-02-11 NOTE — Progress Notes (Signed)
PROGRESS NOTE   Subjective/Complaints: Reports poor sleep last night, had hard time falling asleep. Denies chest pain.   ROS:   Pt denies SOB, abd pain, HA, CP, N/V/C/D, cough and vision changes    Objective:   No results found.  No results for input(s): "WBC", "HGB", "HCT", "PLT" in the last 72 hours.  No results for input(s): "NA", "K", "CL", "CO2", "GLUCOSE", "BUN", "CREATININE", "CALCIUM" in the last 72 hours.   Intake/Output Summary (Last 24 hours) at 02/11/2022 0821 Last data filed at 02/10/2022 1909 Gross per 24 hour  Intake 708 ml  Output --  Net 708 ml          Physical Exam: Vital Signs Blood pressure 138/84, pulse 92, temperature 97.8 F (36.6 C), resp. rate 16, weight 86.1 kg, last menstrual period 11/01/2021, SpO2 98 %.      General: awake, alert, appropriate, NAD, sitting in chair, BMI 31.95 HENT: NAD, [place], AT, R  gaze preference; oropharynx moist CV: RRR no JVD Pulmonary: CTA B/L; no W/R/R, non-labored, normal rate GI: soft, NT, ND, (+)BS Psychiatric: appropriate, pleasant Neurological: awake and alert and oriented x4 L foot with dressing wrapped in ace wrap- C/D/I   PE from prior encounter: Skin: + L 5th toe amputation; wound site open with mild serous drainage, clean wound bed, no erythema or warmth.  Seen wrapped up in ACE wrap- C/D/I MSK:      No apparent deformity.      Strength:                RUE: 5/5 SA, 5/5 EF, 5/5 EE, 5/5 WE, 5/5 FF, 5/5 FA                 LUE: flaccid, 0/5 throughout                RLE: 5/5 HF, 5/5 KE, 5/5 DF, 5/5 EHL, 5/5 PF                 LLE:  3/5 HF, 2/5 KE, 0/5 DF, 0/5 EHL, 1/5 PF    Neurologic exam:  Cognition: AAO to person, place, time and event.  Language: Fluent, No substitutions or neoglisms.moderate dysarthria Mood: Pleasant affect, appropriate mood.  Sensation: To light touch intact in BL UEs and LEs  CN: Left facial  droop   Assessment/Plan: 1. Functional deficits which require 3+ hours per day of interdisciplinary therapy in a comprehensive inpatient rehab setting. Physiatrist is providing close team supervision and 24 hour management of active medical problems listed below. Physiatrist and rehab team continue to assess barriers to discharge/monitor patient progress toward functional and medical goals  Care Tool:  Bathing    Body parts bathed by patient: Right arm, Left arm, Chest, Abdomen, Buttocks, Right upper leg, Left upper leg, Face, Front perineal area, Right lower leg   Body parts bathed by helper: Front perineal area, Right lower leg, Left lower leg Body parts n/a: Left lower leg   Bathing assist Assist Level: Moderate Assistance - Patient 50 - 74%     Upper Body Dressing/Undressing Upper body dressing   What is the patient wearing?: Bra, Pull over shirt  Upper body assist Assist Level: Moderate Assistance - Patient 50 - 74%    Lower Body Dressing/Undressing Lower body dressing      What is the patient wearing?: Incontinence brief, Pants     Lower body assist Assist for lower body dressing: Moderate Assistance - Patient 50 - 74%     Toileting Toileting Toileting Activity did not occur (Clothing management and hygiene only): N/A (no void or bm)  Toileting assist Assist for toileting: Minimal Assistance - Patient > 75%     Transfers Chair/bed transfer  Transfers assist     Chair/bed transfer assist level: Moderate Assistance - Patient 50 - 74%     Locomotion Ambulation   Ambulation assist   Ambulation activity did not occur: Safety/medical concerns  Assist level: Minimal Assistance - Patient > 75% Assistive device: Other (comment) (HHA and R rail) Max distance: 30 ft   Walk 10 feet activity   Assist  Walk 10 feet activity did not occur: Safety/medical concerns  Assist level: Minimal Assistance - Patient > 75% Assistive device: Parallel bars   Walk  50 feet activity   Assist Walk 50 feet with 2 turns activity did not occur: Safety/medical concerns         Walk 150 feet activity   Assist Walk 150 feet activity did not occur: Safety/medical concerns         Walk 10 feet on uneven surface  activity   Assist Walk 10 feet on uneven surfaces activity did not occur: Safety/medical concerns         Wheelchair     Assist Is the patient using a wheelchair?: Yes Type of Wheelchair: Manual    Wheelchair assist level: Total Assistance - Patient < 25% Max wheelchair distance: 300 ft    Wheelchair 50 feet with 2 turns activity    Assist        Assist Level: Total Assistance - Patient < 25%   Wheelchair 150 feet activity     Assist      Assist Level: Total Assistance - Patient < 25%   Blood pressure 138/84, pulse 92, temperature 97.8 F (36.6 C), resp. rate 16, weight 86.1 kg, last menstrual period 11/01/2021, SpO2 98 %.  Medical Problem List and Plan: 1. 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             -patient may shower with lines and foot wound covered             -ELOS/Goals: 14-21 days  WBAT On LLE D/c 10/20 Continue CIR- PT and OT and SLP -She as changed to 15/7 2.  Antithrombotics: -DVT/anticoagulation:  Pharmaceutical: Lovenox             -antiplatelet therapy: Brilinta 3. Pain Management: Tylenol, oxycodone as needed. Left arm sling ordered.  4. Mood/Behavior/Sleep: LCSW to evaluate and provide support             -continue melatonin 3 mg q HS             -antipsychotic agents: n/a 5. Neuropsych/cognition: This patient is capable of making decisions on her own behalf. 6. 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 Fluids/Electrolytes/Nutrition: Strict Is and Os and follow-up  chemistries             -  dys 3/thin liquids; SLP eval             -repeat mag/phos tomorrow 8: Right ICA dissection with stent placement: on Brilinta 9: Hypertension: monitor             -continue hydralazine 12.5 mg TID             -continue isosorbide 20 mg BID             -continue Lopressor 37.5 mg BID  10/2- BP is controlled- con't regimen  10/3- had BP of 80s/50s yesterday- hydralazine stopped- is better this AM- will see how does in therapy with standing- concern will drop  10/4- will decrease Metoprolol to 25 mg BID- not change to succinate, since won't have ability to change meds as often.   10/5- BP overnight 110s/120s supine- will see how tolerates therapy today.   10/6- BP doing better, they increased her B blocker back to 37.5 mg BID   10/12 well controlled, continue to monitor    02/11/2022    7:49 AM 02/11/2022    5:08 AM 02/10/2022    8:03 PM  Vitals with BMI  Systolic [phone]  Diastolic 84 78 54  Pulse 92 84 90    10: Hyperlipidemia: continue Lipitor 40 mg daily 11: Coagulopathy s/p RUE embolectomy 10/2021: Eliquis PTA now on Lovenox; f                -follow-up hematology as outpt 12: COPD exacerbation: hypoxia resolved; continue bronchodilators/nebs 13: Chronic diastolic and systolic heart failure:             -strict Is and Os and daily weight             -no diuretics currently             -see meds above   Filed Weights   02/07/22 0420 02/09/22 0500 02/10/22 0517  Weight: 87.1 kg 85.9 kg 86.1 kg   10/5- will start Lasix 10 mg daily due to vascualar congestion on CXR  10/6- Per Cards, needs 40 mg daily- will change  10/7 weight appears stable, monitor 14: Hypothyroidism: continue Synthroid 125 mcg daily; follow-up TSH 15: 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   10/3- ate breakfast tray this AM- BG's running from 56 (low yesterday) to 312 yesterday due to foods form home- will ask DM coordinator to help try and manage.   10/4- CBG's mid 100s to mid 200s- a little better in last 24 hours- no hypoglycemia  10/6- Dm coordinator - increased meals novolog 5 units TID with meals and night/QHS 0-5 SSI  10/7 increase Novolog to 7 units TID  10/8 CBGs elevated last night, a little better today, continue monitor response to insulin adjustment  10/10 increase levemir to 27u  10/11 DM coordinator recommending 10 U novolog TID meal coverage and 25 U levemir BID, will make these changes   10/12 Monitor trend due to  recent changes in insulin 17: Class 2 obesity: dietary counseling 18: Tobacco abuse: cessation counseling; continue nicotine patch 19: AKI atop CKD IV: trend BMP  -9/29 Cr improved to 1.88  10/2- Down to 1.59 and BUN 24- will push fluids and recheck in AM  10/3- Cr 1.64 and BUN 24- basically stable- will recheck Thursday-   10/4- ordered BMP for tomorrow  10/5- Cr 2.12 up from 1.64- but required Lasix 40 mg PO x1- actually will start lasix 10 mg daily due to pulm/vascular congestion-   10/6- CR usually runs ~ 2 per Cards note-   -10/10 CR stable at 2.16 20: CAD: stable; treated medically; Dr. Harl Bowie 21: Left foot osteomyelitis s/p ray amputation [name] -recent bone biopsy; cultures neg - Husband to bring in ?CAM boot for weightbearing, protection - Remove IJ order - 9/30 Noted surgical site dehiscence with PT ambulation, was not wearing orthotic. Podiatry consulted, imaging showing mildly angulated fracture of the distal fourth metatarsal (likely iatrogenic from prior amputation)               - Orders for betadyne dressing daily               - NWB LLE               - Appreciate  podiatry recs, to discuss 4th MT fx as group next week  10/2- Podiatry says once has CAM boot to be worn- either from home or for [name] to order- d/w PA-  and will check in to it. Supposedly had a dehiscence but podiatry to see again today.   10/3- podiatry said they'd come back for possible bedside debridement- can WBAT now with CAM boot  10/11 Seen by Podiatry for L foot ulcer,  Iodosorb and gauze dressings daily, WBAT LLE in surgical shoe 22: Leukocytosis: trending down; follow-up CBC  -WBC down to 7/1 on 10/9 23. Dysphagia  -Advanced to heart healthy/carb modified/ thin liquids            - 9/30 - family requesting diet liberalization as patient primarily eating foods brought in from outside d/t hospital food bland; carb modified diet unable to be ordered with less restrictive parameters. Dietary consulted.   24. RSV  10/2- will take off Droplet precautions- no more Sx's.  25. Chest pain- in setting of severe cardiac issues including CHF with EF 35-40%, CAD- acute on chronic CHF  10/5- CP last night- required NTG x2- EKG had no changes- troponins elevated probably due to pulmonary congestion which was also found- will transfuse 1 unit to help CP; also restart her home Protonix for hx of reflux.  10/10 CP last night, resolved with NTG, troponins not increased above baseline 26, ABLA  10/5- will transfuse 1 unit- think the Hb 7.8 that came back 5 hours later is likely due to hemoconcentration, and isn't real, since her Hb has been trending down for days. On Lovenox and Brillenta- check Fecal occult for blood loss.   HGB stable at 8.8 on 10/9  27. Insomnia  10/5- will increase trazodone to 75 mg QHS per pt request  10/6- slepeing better per pt.   10/12 Increase trazodone to 100mg  QHS    LOS: 14 days A FACE TO FACE EVALUATION WAS PERFORMED  Jennye Boroughs 02/11/2022, 8:21 AM

## 2022-02-11 NOTE — Progress Notes (Signed)
Speech Language Pathology Daily Session Note  Patient Details  Name: Mathew Storck MRN: [id number] Date of Birth: 03/24/1981  Today's Date: 02/11/2022 SLP Individual Time: 0902-0930 SLP Individual Time Calculation (min): 28 min  Short Term Goals: Week 2: SLP Short Term Goal 1 (Week 2): Pt will utilize visual scanning strategies to complete tasks targeting L inattention with 100% completion given Mod A verbal and visual cues. SLP Short Term Goal 2 (Week 2): Pt will complete basic visual and verbal problem-solving tasks r/t iADL and ADL completion given supervision verbal cues. SLP Short Term Goal 3 (Week 2): Patient will self-monitor and correct errors during functional tasks with Mod A verbal cues. SLP Short Term Goal 4 (Week 2): Patient will demonstrate sustained attention to functional tasks for 30 minutes with Mod verbal cues for redirection.  Skilled Therapeutic Interventions: Skilled treatment session focused on cognitive goals. Upon arrival, patient had a mild nose bleed. Nursing made aware. Patient reports intermittent nose bleeds at baseline due to allergies. SLP facilitated session by providing Mod-Max A verbal and visual cues for visual scanning/attention to the left field of environment during a structured task. Patient also required Min verbal cues for attention to task as patient demonstrated difficulty alternating attention throughout. Patient left upright in her wheelchair with alarm on and all needs within reach. Continue with current plan of care.      Pain Pain Assessment Pain Scale: 0-10 Pain Score: 0-No pain  Therapy/Group: Individual Therapy  Ervan Heber 02/11/2022, 3:20 PM

## 2022-02-11 NOTE — Plan of Care (Signed)
  Problem: Consults Goal: RH STROKE PATIENT EDUCATION Description: See Patient Education module for education specifics  Outcome: Progressing Goal: Diabetes Guidelines if Diabetic/Glucose > 140 Description: If diabetic or lab glucose is > 140 mg/dl - Initiate Diabetes/Hyperglycemia Guidelines & Document Interventions  Outcome: Progressing   Problem: RH BOWEL ELIMINATION Goal: RH STG MANAGE BOWEL WITH ASSISTANCE Description: STG Manage Bowel with MOD Assistance. Outcome: Progressing Goal: RH STG MANAGE BOWEL W/MEDICATION W/ASSISTANCE Description: STG Manage Bowel with Medication with min Assistance. Outcome: Progressing   Problem: RH BLADDER ELIMINATION Goal: RH STG MANAGE BLADDER WITH ASSISTANCE Description: STG Manage Bladder With Mod Assistance Outcome: Progressing   Problem: RH SKIN INTEGRITY Goal: RH STG SKIN FREE OF INFECTION/BREAKDOWN Description: Skin will be free of any additional breakdown or infection with min assit Outcome: Progressing Goal: RH STG MAINTAIN SKIN INTEGRITY WITH ASSISTANCE Description: STG Maintain Skin Integrity With min Assistance. Outcome: Progressing Goal: RH STG ABLE TO PERFORM INCISION/WOUND CARE W/ASSISTANCE Description: STG Able To Perform Incision/Wound Care With min Assistance. Outcome: Progressing   Problem: RH SAFETY Goal: RH STG ADHERE TO SAFETY PRECAUTIONS W/ASSISTANCE/DEVICE Description: STG Adhere to Safety Precautions With min Assistance/Device. Outcome: Progressing   Problem: RH COGNITION-NURSING Goal: RH STG USES MEMORY AIDS/STRATEGIES W/ASSIST TO PROBLEM SOLVE Description: STG Uses Memory Aids/Strategies With min Assistance to Problem Solve. Outcome: Progressing   Problem: RH PAIN MANAGEMENT Goal: RH STG PAIN MANAGED AT OR BELOW PT'S PAIN GOAL Description: Pain will be managed at 3 out of 10 on pain scale with min assist Outcome: Progressing   Problem: RH KNOWLEDGE DEFICIT Goal: RH STG INCREASE KNOWLEDGE OF  DIABETES Description: Patient/caregiver will be able to verbalize diabetic medications, appropriate foods  Outcome: Progressing Goal: RH STG INCREASE KNOWLEDGE OF HYPERTENSION Outcome: Progressing Goal: RH STG INCREASE KNOWLEDGE OF DYSPHAGIA/FLUID INTAKE Outcome: Progressing Goal: RH STG INCREASE KNOWLEGDE OF HYPERLIPIDEMIA Outcome: Progressing Goal: RH STG INCREASE KNOWLEDGE OF STROKE PROPHYLAXIS Outcome: Progressing

## 2022-02-11 NOTE — Group Note (Unsigned)
Patient Details Name: Lauren [name] MRN: [id number] DOB: 11/21/1980 Today's Date: 02/11/2022  Time Calculation:        Group Description: [employer]: Patient participated in [employer] with both OT and SLP with focus on self-feeding, dysphagia goals, cognitive goals, and appropriate social interaction within a group environment.    Individual level documentation: Patient participated in diner's club and required {assistance:28279} assist for functional use of {RIGHT/LEFT:20294} UE during self-feeding.   Patient participated in [employer] club with focus on dysphagia goals. Patient consumed their lunch meal of {diet with liquids:28280}. Patient consumed meal {consistent/inconsistent:28281} overt s/s of aspiration and required {assistance:28279} assist for use of swallowing compensatory strategies. Recommend patient continue current diet.   Patient participated in [employer] club with focus on cognitive goals. Patient required (drop down selection {assistance:28279} assist for selective attention in a mildly distracting environment. Patient also required {assistance:28279} assist for visual scanning to {RIGHT/LEFT:20294} field of environment. Patient also demonstrated appropriate social interaction within a group environment with overall {assistance:28279} assist.   Pain: Pain Assessment Pain Scale: 0-10 Pain Score: 0-No pain  Precautions:     Willeen Cass [facility] 02/11/2022, 1:16 PM

## 2022-02-11 NOTE — Progress Notes (Signed)
Occupational Therapy Session Note  Patient Details  Name: Lauren [name] MRN: [id number] Date of Birth: [birth year]  Today's Date: 02/11/2022 OT Individual Time: 0800-0900 OT Individual Time Calculation (min): 60 min    Short Term Goals: Week 2:  OT Short Term Goal 1 (Week 2): Pt will require set up and min cues for simple grooming sinkside using L hand as gross assist with min cus OT Short Term Goal 2 (Week 2): Pt will recall using hemi techniques for UB and LB clothing management with min cues OT Short Term Goal 3 (Week 2): Pt will consistently attend to L side during functional task 2/3 trials with mod cues OT Short Term Goal 4 (Week 2): Pt will transfer off and on toilet and tub transfer bench with CGA and cues to reduce impulsivity  Skilled Therapeutic Interventions/Progress Updates:   Pt seated in recliner just beginning am meal. OT worked with pt on scanning to L side of tray and trialing use of L UE as gross A as much as possible including positioning proximally for support and weightbearing. Transferred recliner to w/c with CGA and mod cues. Sink side self care routine as follows with strong OT intervention for L hemi techniques and L sided gross A functional integration. Min A UB self care including doffing previous clothes, sponge bathing, pullover bra and shirt and deodorant. Pt able to doff LB pull-off clothing with min A and don pull on sweat pants and R sock with min A. Incontinence brief, R TED hose and L ortho shoe with mod A and cues d/t difficulty maintaining balance in standing and using R UE for task. Set up pt for next session with needs, call bell and chair alarm active.   Therapy Documentation Precautions:  Precautions Precautions: Fall Precaution Comments: L inattention, R gaze preference, L hemiplegia, no dressing to L foot - wear post-op shoe OOB Required Braces or Orthoses: Other Brace Other Brace: dressing on L foot Restrictions Weight Bearing Restrictions:  No    Therapy/Group: Individual Therapy  [name] 02/11/2022, 7:49 AM

## 2022-02-11 NOTE — Group Note (Addendum)
Patient Details Name: Lauren [name] MRN: [id number] DOB: Jun 26, 1980 Today's Date: 02/11/2022  Time Calculation:     SLP Group Time Calculation SLP Group Start Time: 1200 SLP Group Stop Time: 9179 SLP Group Time Calculation (min): 30 min  Group Description: [employer]: Patient participated in [employer] with both OT and SLP with focus on self-feeding, dysphagia goals, cognitive goals, and appropriate social interaction within a group environment.    Individual level documentation:  Patient participated in [employer] club with focus on dysphagia goals. Patient consumed their lunch meal of regular textures and thin liquids. Patient consumed meal without overt s/s of aspiration and required supervision assist for use of swallowing compensatory strategies. Recommend patient continue current diet.   Patient participated in [employer] club with focus on cognitive goals. Patient required supervision assist for selective attention in a mildly distracting environment. Patient also required min assist for visual scanning to left field of environment. Patient also demonstrated appropriate social interaction within a group environment with overall mod I assist.   Pain: Pain Assessment Pain Scale: 0-10 Pain Score: 0-No pain     [name] 02/11/2022, 3:19 PM

## 2022-02-11 NOTE — Progress Notes (Signed)
Physical Therapy Session Note  Patient Details  Name: Lauren [name] MRN: [id number] Date of Birth: Sep 15, 1980  Today's Date: 02/11/2022 PT Individual Time: 1115-1200 PT Individual Time Calculation (min): 45 min   Short Term Goals: Week 2:  PT Short Term Goal 1 (Week 2): Pt will ambulate 30 ft min A PT Short Term Goal 2 (Week 2): Pt will perform bed to chair transfer with CGA or less PT Short Term Goal 3 (Week 2): Pt will require min A for dynamic standing  Skilled Therapeutic Interventions/Progress Updates:      Therapy Documentation Precautions:  Precautions Precautions: Fall Precaution Comments: L inattention, R gaze preference, L hemiplegia, no dressing to L foot - wear post-op shoe OOB Required Braces or Orthoses: Other Brace Other Brace: dressing on L foot Restrictions Weight Bearing Restrictions: No  Pt received seated in w/c at bedside and reports unrated HA that is constant in nature. Pt agreeable to PT session with emphasis on gait training with RW and left hand splint. Pt transported to main gym by w/c for time management and energy conservation. In session, pt ambulated ~55 ft ( 20 + 20 +10+5)  with primarily min A and mod A for recovery following 3 episodes of loss of balance. PT provided visual cueing (mirror), tactile cueing at hips for weight shifting and verbal cueing to decrease left lean and increase foot clearance. Pt tearful throughout session and therapist provided emotional support and discussed setting short term realistic goals and pt agreeable. Pt requested to participate in OT/ST lunch and left in care of therapists in dayroom for meal.    Therapy/Group: Individual Therapy  Verl Dicker Verl Dicker PT, DPT  02/11/2022, 7:51 AM

## 2022-02-12 ENCOUNTER — Inpatient Hospital Stay (HOSPITAL_COMMUNITY): Payer: No Typology Code available for payment source

## 2022-02-12 LAB — GLUCOSE, CAPILLARY
Glucose-Capillary: 128 mg/dL — ABNORMAL HIGH (ref 70–99)
Glucose-Capillary: 218 mg/dL — ABNORMAL HIGH (ref 70–99)
Glucose-Capillary: 107 mg/dL — ABNORMAL HIGH (ref 70–99)
Glucose-Capillary: 226 mg/dL — ABNORMAL HIGH (ref 70–99)

## 2022-02-12 MED ORDER — TRAZODONE HCL 50 MG PO TABS
100.0000 mg | ORAL_TABLET | Freq: Every day | ORAL | Status: DC
  Administered 2022-02-12: 100 mg via ORAL
  Filled 2022-02-12: qty 2

## 2022-02-12 NOTE — Progress Notes (Signed)
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   Medical History: Past Medical History:  Diagnosis Date   Anemia    CAD (coronary artery disease)    a. s/p cath in 03/2014 showing 30% mid-LAD, moderate to severe disease along small D1, patent LCx, moderate to severe distal OM2 stenosis and moderate diffuse diease along RCA not amenable to PCI   CHF (congestive heart failure) ([place])    a. EF 55-60% in 12/2019 b. 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 Hypercoagulable work-up showed abnormal lupus anticoagulant but was also on heparin, slightly increased rheumatoid factor. Patient transferred to CIR on 01/28/22. Patient needs repeat hypercoagulable labs in 3 months. Pharmacy consulted for enoxaparin.  LMWH level 1.12 is drawn correctly 4hr after dose and therapeutic on 1.5mg /kg/d of enoxaparin. No signs of bleeding. Patient with two therapeutic levels on this dose, will stop checking unless renal function changes  significantly.    Goal of Therapy:  LMWH level goal 1-2  Monitor platelets by anticoagulation protocol: Yes   Plan:  Continue enoxaparin 120mg  q24 hr Monitor for signs/symptoms of bleeding    Benetta Spar, PharmD, BCPS, BCCP Clinical Pharmacist  Please check AMION for all [place] phone numbers After 10:00 PM, call [place] [phone]

## 2022-02-12 NOTE — Progress Notes (Signed)
Speech Language Pathology Daily Session Note  Patient Details  Name: Lauren [name] MRN: [id number] Date of Birth: 1981/04/12  Today's Date: 02/12/2022 SLP Individual Time: 0240-9735 SLP Individual Time Calculation (min): 45 min  Short Term Goals: Week 2: SLP Short Term Goal 1 (Week 2): Pt will utilize visual scanning strategies to complete tasks targeting L inattention with 100% completion given Mod A verbal and visual cues. SLP Short Term Goal 2 (Week 2): Pt will complete basic visual and verbal problem-solving tasks r/t iADL and ADL completion given supervision verbal cues. SLP Short Term Goal 3 (Week 2): Patient will self-monitor and correct errors during functional tasks with Mod A verbal cues. SLP Short Term Goal 4 (Week 2): Patient will demonstrate sustained attention to functional tasks for 30 minutes with Mod verbal cues for redirection.  Skilled Therapeutic Interventions:Skilled ST services focused on cognitive skills. SLP facilitated mildly complex problem solving, error awareness and sustained attention in ALFA daily math questions, pt was able to answer 6/8 questions without aids  and then 7/8 with mod A verbal cues. Pt demonstrated sustained attention for 30 minute then it quickly faded. Pt was left in room with call bell within reach and chair alarm set. SLP recommends to continue skilled services.      Pain Pain Assessment Pain Scale: 0-10 Pain Score: 0-No pain Pain Type: Acute pain Pain Location: Toe (Comment which one) (second toe on right foot) Pain Orientation: Medial Pain Onset: Other (Comment) (due to patient toe scraping stedy) Patients Stated Pain Goal: 0 Pain Intervention(s): Distraction PAINAD (Pain Assessment in Advanced Dementia) Breathing: normal Negative Vocalization: none Facial Expression: smiling or inexpressive Body Language: relaxed Consolability: no need to console PAINAD Score: 0 Critical Care Pain Observation Tool (CPOT) Facial Expression:  Relaxed, neutral Body Movements: Absence of movements Muscle Tension: Relaxed Compliance with ventilator (intubated pts.): N/A Vocalization (extubated pts.): N/A CPOT Total: 0  Therapy/Group: Individual Therapy  Rahul Malinak  [facility] 02/12/2022, 4:11 PM

## 2022-02-12 NOTE — Progress Notes (Signed)
Recreational Therapy Session Note  Patient Details  Name: Lauren [name] MRN: [id number] Date of Birth: May 24, 1980 Today's Date: 02/12/2022  Pain: no c/o Skilled Therapeutic Interventions/Progress Updates:  Pt participated in animal assisted activity seated in the recliner with supervision. Pt easily engaged in conversation with pet partner team, sharing with team about her personal dog.  Pt appreciative of this visit.  [place] 02/12/2022, 12:09 PM

## 2022-02-12 NOTE — Progress Notes (Signed)
Occupational Therapy Weekly Progress Note  Patient Details  Name: Lauren [name] MRN: [id number] Date of Birth: [birth year]  Beginning of progress report period: February 05, 2022 End of progress report period: February 12, 2022  Today's Date: 02/12/2022 OT Individual Time: 1100-1200 OT Individual Time Calculation (min): 60 min    Patient has met 4 of 4 short term goals.  However, pt remains impulsive, high falls risk and impaired L UE/LE neuromotor skills as well as cog/perceptual deficits requiring cues and facilitative support. Pt to be discharged home next week with family and min A overall goals. Pt more motivated this week and improving engagement with therapy and overall socialization.   Patient continues to demonstrate the following deficits: muscle weakness and muscle paralysis, decreased cardiorespiratoy endurance, impaired timing and sequencing, abnormal tone, unbalanced muscle activation, decreased coordination, and decreased motor planning, decreased attention to left, left side neglect, and decreased motor planning, decreased attention, decreased awareness, decreased problem solving, decreased safety awareness, decreased memory, and delayed processing, and decreased sitting balance, decreased standing balance, decreased postural control, hemiplegia, and decreased balance strategies and therefore will continue to benefit from skilled OT intervention to enhance overall performance with BADL, iADL, Vocation, and Reduce care partner burden.  Patient progressing toward long term goals..  Continue plan of care.  OT Short Term Goals Week 2:  OT Short Term Goal 1 (Week 2): Pt will require set up and min cues for simple grooming sinkside using L hand as gross assist with min cus OT Short Term Goal 1 - Progress (Week 2): Met OT Short Term Goal 2 (Week 2): Pt will recall using hemi techniques for UB and LB clothing management with min cues OT Short Term Goal 2 - Progress (Week 2): Met OT  Short Term Goal 3 (Week 2): Pt will consistently attend to L side during functional task 2/3 trials with mod cues OT Short Term Goal 3 - Progress (Week 2): Met OT Short Term Goal 4 (Week 2): Pt will transfer off and on toilet and tub transfer bench with CGA and cues to reduce impulsivity OT Short Term Goal 4 - Progress (Week 2): Met Week 3:  OT Short Term Goal 1 (Week 3): Pt will stand sinkside for all grooming activity with close s for up to 4- 5 minutes OT Short Term Goal 2 (Week 3): Pt will use RW to access toilet and shower with min A and mod cues for safety, awareness and L sided scanning using L hand walker splint OT Short Term Goal 3 (Week 3): Pt will demonstrate UB dressing with set up only incl pull over bra OT Short Term Goal 4 (Week 3): Pt will perform transfer to shower mirroring home set up with close S and mod cues  Skilled Therapeutic Interventions/Progress Updates:  Pt seen for skilled OT session with focus on weekly functional re-assessment, progress, new goals in collaboration with pt and team. Pt in recliner upon OT arrival and open to all tx. Pt transferred with CGA recliner to w/c with mod cues for L LE placement and directionality. OT reassessment of toilet, shower transfers with overall CGA and mod cues for same as chair. Stood for 3 min sink side with CGA for oral care with CGA and cues. OT transported to day room gym for theraball balance activity. Transfer off and on ball from mat x 2 with CGA and mod cues. Reaching activity on ball to L side while weightbearing through L UE with R UE with CGA. SaeboStim One applied  to L sh throughout session. See below for response and parameters. Once back in room, pt left up in w/c with chair exit alarm engaged, nurse call button and needs in place.   no adverse skin reactions.  330 pulse width 35 Hz pulse rate On 8 sec/ off 8 sec Ramp up/ down 2 sec Symmetrical Biphasic wave form  Max intensity 1109m at 500 Ohm load  Therapy  Documentation Precautions:  Precautions Precautions: Fall Precaution Comments: L inattention, R gaze preference, L hemiplegia, no dressing to L foot - wear post-op shoe OOB Required Braces or Orthoses: Other Brace Other Brace: dressing on L foot Restrictions Weight Bearing Restrictions: No    Therapy/Group: Individual Therapy  [name]10/13/2023, 7:45 AM

## 2022-02-12 NOTE — Progress Notes (Signed)
Physical Therapy Session Note  Patient Details  Name: Lauren [name] MRN: [id number] Date of Birth: 1980/09/10  Today's Date: 02/12/2022 PT Individual Time: 0802-0900, 4098-1191  PT Individual Time Calculation (min): 58 min & 13 minutes  Missed Time (min): 17 min due to toileting    Short Term Goals: Week 2:  PT Short Term Goal 1 (Week 2): Pt will ambulate 30 ft min A PT Short Term Goal 2 (Week 2): Pt will perform bed to chair transfer with CGA or less PT Short Term Goal 3 (Week 2): Pt will require min A for dynamic standing  Skilled Therapeutic Interventions/Progress Updates:      Therapy Documentation Precautions:  Precautions Precautions: Fall Precaution Comments: L inattention, R gaze preference, L hemiplegia, no dressing to L foot - wear post-op shoe OOB Required Braces or Orthoses: Other Brace Other Brace: dressing on L foot Restrictions Weight Bearing Restrictions: No  Treatment Session 1:  Pt received seated in recliner at bedside agreeable to PT session. Pt reports baseline HA and MD notified. PT depenently donned ted hose and short stretch ace wrap for blood pressure management. Pt requuires min A with stand pivot transfer to w/c and requests need to void. Pt min A for toilet transfer and continent. Nurse present for medication adminstration. Pt transported by w/c to dayroom and agreeable to gait training. Pt ambulated 5 + 5 ft with RW and L hand splint and mod A. Pt requires verbal cues to facilitate weight shifting and to address left lateral lean. Pt transitioned to blocked practice of sit to stands with RW x 5 and requires CGA. Pt transported by w/c to room and left seated in recliner with all needs in reach and alarm on.    Treatment Session 2:  Pt received seated in w/c at bedside agreeable to PT session with emphasis on gait training. Pt reports baseline HA during session and provided with rest breaks for relief. Pt transported by w/c to main gym for time management  and energy conservation. Pt CGA with sit to stand and min A with ambulation x 2 feet with RW with L hand splint with close w/c follow. Pt requested to sit and provided with rest break and pt reports need for BM. Pt states nursing needs stool sample. PT present for hand off of patient to NT for toileting. Pt missed 17 minutes of skilled PT.   Therapy/Group: Individual Therapy  Verl Dicker Verl Dicker PT, DPT  02/12/2022, 7:39 AM

## 2022-02-12 NOTE — Progress Notes (Signed)
   02/12/22 1500  What Happened  Was fall witnessed? Yes  Who witnessed fall? melanie, NT, Or[place]a, NT  Patients activity before fall bathroom-assisted  Point of contact buttocks  Was patient injured? No  Adult Fall Risk Assessment  Risk Factor Category (scoring not indicated) High fall risk per protocol (document High fall risk)  Patient Fall Risk Level High fall risk  Adult Fall Risk Interventions  Required Bundle Interventions *See Row Information* High fall risk - low, moderate, and high requirements implemented  Additional Interventions Use of appropriate toileting equipment (bedpan, BSC, etc.);Lap belt while in chair/wheelchair (Rehab only)  Screening for Fall Injury Risk (To be completed on HIGH fall risk patients) - Assessing Need for Floor Mats  Risk For Fall Injury- Criteria for Floor Mats None identified - No additional interventions needed  Will Implement Floor Mats Yes  Pain Assessment  Pain Scale 0-10  Pain Score 7  Pain Type Acute pain  Pain Location Toe (Comment which one) (second toe on right foot)  Pain Orientation Medial  Pain Onset Other (Comment) (due to patient toe scraping stedy)  Patients Stated Pain Goal 0  Pain Intervention(s) Distraction  PAINAD (Pain Assessment in Advanced Dementia)  Breathing 0  Negative Vocalization 0  Facial Expression 0  Body Language 0  Consolability 0  PAINAD Score 0  Critical Care Pain Observation Tool (CPOT)  Facial Expression 0  Body Movements 0  Muscle Tension 0  Compliance with ventilator (intubated pts.) N/A  Vocalization (extubated pts.) N/A  CPOT Total 0  PCA/Epidural/Spinal Assessment  Respiratory Pattern Regular;Unlabored  Neurological  Neuro (WDL) X  Level of Consciousness Alert  Orientation Level Oriented X4  Cognition Follows commands  Speech Clear  R Pupil Size (mm) 3  R Pupil Shape Round  R Pupil Reaction Brisk  L Pupil Size (mm) 4  L Pupil Shape Round  L Pupil Reaction Brisk  Motor  Function/Sensation Assessment Grip;Motor response;Motor strength  Facial Symmetry Asymmetrical right  R Hand Grip Moderate  L Hand Grip Absent  RUE Motor Response Purposeful movement  RUE Sensation Full sensation  RUE Motor Strength 4  LUE Motor Response Non-purposeful movement  LUE Sensation Decreased  LUE Motor Strength 1  RLE Motor Response Purposeful movement  RLE Sensation Full sensation  RLE Motor Strength 4  LLE Motor Response Purposeful movement  LLE Sensation Decreased  LLE Motor Strength 2  Neuro Symptoms Fatigue  Neuro symptoms relieved by Rest  Glasgow Coma Scale  Eye Opening 4  Best Verbal Response (NON-intubated) 5  Best Motor Response 6  Glasgow Coma Scale Score 15  Musculoskeletal  Musculoskeletal (WDL) X  Assistive Device [place];Wheelchair  Generalized Weakness Yes  Weight Bearing Restrictions No  Musculoskeletal Details  LUE Paralysis  LLE Surgery  Integumentary  Integumentary (WDL) WDL  Skin Color Appropriate for ethnicity  Skin Condition Dry  Skin Integrity Intact  Abrasion Location Toe (Comment  which one) (second toe on right foot)  Abrasion Location Orientation Left  Abrasion Intervention Other (Comment)  Ecchymosis Location Abdomen  Ecchymosis Location Orientation Bilateral  Skin Turgor Non-tenting

## 2022-02-12 NOTE — Progress Notes (Signed)
PROGRESS NOTE   Subjective/Complaints: Working with therapy this AM. Reports good progress with PT. No new concerns or complaints.   ROS:   Review of Systems  Constitutional:  Negative for chills and fever.  HENT:  Negative for congestion.   Respiratory:  Negative for sputum production and shortness of breath.   Cardiovascular:  Negative for chest pain and palpitations.  Gastrointestinal:  Negative for abdominal pain.  Neurological:  Negative for headaches.       Objective:   No results found.  No results for input(s): "WBC", "HGB", "HCT", "PLT" in the last 72 hours.  No results for input(s): "NA", "K", "CL", "CO2", "GLUCOSE", "BUN", "CREATININE", "CALCIUM" in the last 72 hours.   Intake/Output Summary (Last 24 hours) at 02/12/2022 0941 Last data filed at 02/12/2022 0802 Gross per 24 hour  Intake 597 ml  Output --  Net 597 ml          Physical Exam: Vital Signs Blood pressure 113/62, pulse 84, temperature 98 F (36.7 C), resp. rate 18, weight 86.5 kg, last menstrual period 11/01/2021, SpO2 100 %.      General: awake, alert, appropriate, NAD, sitting in WC, working with therapy in her room HENT: NAD, [place], AT, R  gaze preference; oropharynx moist CV: RRR no JVD Pulmonary: CTA B/L; no W/R/R, non-labored, normal rate GI: soft, NT, ND, (+)BS Psychiatric: appropriate, pleasant Neurological: awake and alert and oriented x4 L foot with dressing wrapped in ace wrap- C/D/I   PE from prior encounter: Skin: + L 5th toe amputation; wound site open with mild serous drainage, clean wound bed, no erythema or warmth.  Seen wrapped up in ACE wrap- C/D/I MSK:      No apparent deformity.      Strength:                RUE: 5/5 SA, 5/5 EF, 5/5 EE, 5/5 WE, 5/5 FF, 5/5 FA                 LUE: flaccid, 0/5 throughout                RLE: 5/5 HF, 5/5 KE, 5/5 DF, 5/5 EHL, 5/5 PF                 LLE:  3/5 HF, 2/5 KE, 0/5  DF, 0/5 EHL, 1/5 PF    Neurologic exam:  Cognition: AAO to person, place, time and event.  Language: Fluent, No substitutions or neoglisms.moderate dysarthria Mood: Pleasant affect, appropriate mood.  Sensation: To light touch intact in BL UEs and LEs  CN: Left facial droop   Assessment/Plan: 1. Functional deficits which require 3+ hours per day of interdisciplinary therapy in a comprehensive inpatient rehab setting. Physiatrist is providing close team supervision and 24 hour management of active medical problems listed below. Physiatrist and rehab team continue to assess barriers to discharge/monitor patient progress toward functional and medical goals  Care Tool:  Bathing    Body parts bathed by patient: Right arm, Left arm, Chest, Abdomen, Buttocks, Right upper leg, Left upper leg, Face, Front perineal area, Right lower leg   Body parts bathed by helper: Front perineal area,  Right lower leg, Left lower leg Body parts n/a: Left lower leg   Bathing assist Assist Level: Moderate Assistance - Patient 50 - 74%     Upper Body Dressing/Undressing Upper body dressing   What is the patient wearing?: Bra, Pull over shirt    Upper body assist Assist Level: Moderate Assistance - Patient 50 - 74%    Lower Body Dressing/Undressing Lower body dressing      What is the patient wearing?: Incontinence brief, Pants     Lower body assist Assist for lower body dressing: Moderate Assistance - Patient 50 - 74%     Toileting Toileting Toileting Activity did not occur (Clothing management and hygiene only): N/A (no void or bm)  Toileting assist Assist for toileting: Minimal Assistance - Patient > 75%     Transfers Chair/bed transfer  Transfers assist     Chair/bed transfer assist level: Moderate Assistance - Patient 50 - 74%     Locomotion Ambulation   Ambulation assist   Ambulation activity did not occur: Safety/medical concerns  Assist level: Minimal Assistance - Patient  > 75% Assistive device: Walker-rolling (left hand splint) Max distance: 55 ft   Walk 10 feet activity   Assist  Walk 10 feet activity did not occur: Safety/medical concerns  Assist level: Minimal Assistance - Patient > 75% Assistive device: Walker-rolling   Walk 50 feet activity   Assist Walk 50 feet with 2 turns activity did not occur: Safety/medical concerns  Assist level: Minimal Assistance - Patient > 75% Assistive device: Walker-rolling    Walk 150 feet activity   Assist Walk 150 feet activity did not occur: Safety/medical concerns         Walk 10 feet on uneven surface  activity   Assist Walk 10 feet on uneven surfaces activity did not occur: Safety/medical concerns         Wheelchair     Assist Is the patient using a wheelchair?: Yes Type of Wheelchair: Manual    Wheelchair assist level: Total Assistance - Patient < 25% Max wheelchair distance: 300 ft    Wheelchair 50 feet with 2 turns activity    Assist        Assist Level: Total Assistance - Patient < 25%   Wheelchair 150 feet activity     Assist      Assist Level: Total Assistance - Patient < 25%   Blood pressure 113/62, pulse 84, temperature 98 F (36.7 C), resp. rate 18, weight 86.5 kg, last menstrual period 11/01/2021, SpO2 100 %.  Medical Problem List and Plan: 1. 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             -patient may shower with lines and foot wound covered             -ELOS/Goals: 14-21 days  WBAT On LLE D/c 10/20 Continue CIR- PT and OT and SLP -15/7 discontinued -She walked 55 feet total during session with PT yesterday 2.  Antithrombotics: -DVT/anticoagulation:  Pharmaceutical: Lovenox             -antiplatelet therapy: Brilinta 3. Pain Management: Tylenol, oxycodone as needed. Left arm sling ordered.   4. Mood/Behavior/Sleep: LCSW to evaluate and provide support             -continue melatonin 3 mg q HS             -  antipsychotic agents: n/a 5. Neuropsych/cognition: This patient is capable of making decisions on her own behalf. 6. 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 Fluids/Electrolytes/Nutrition: Strict Is and Os and follow-up chemistries             -dys 3/thin liquids; SLP eval             -repeat mag/phos tomorrow 8: Right ICA dissection with stent placement: on Brilinta 9: Hypertension: monitor             -continue hydralazine 12.5 mg TID             -continue isosorbide 20 mg BID             -continue Lopressor 37.5 mg BID  10/2- BP is controlled- con't regimen  10/3- had BP of 80s/50s yesterday- hydralazine stopped- is better this AM- will see how does in therapy with standing- concern will drop  10/4- will decrease Metoprolol to 25 mg BID- not change to succinate, since won't have ability to change meds as often.   10/5- BP overnight 110s/120s supine- will see how tolerates therapy today.   10/6- BP doing better, they increased her B blocker back to 37.5 mg BID   10/13 well controlled, continue to monitor    02/12/2022    8:10 AM 02/12/2022    5:00 AM 02/12/2022    3:57 AM  Vitals with BMI  Weight  190 lbs 11 oz   BMI  83.41   Systolic 962  229  Diastolic 62  78  Pulse   84    10: Hyperlipidemia: continue Lipitor 40 mg daily 11: Coagulopathy s/p RUE embolectomy 10/2021: Eliquis PTA now on Lovenox; f                -follow-up hematology as outpt 12: COPD exacerbation: hypoxia resolved; continue bronchodilators/nebs 13: Chronic diastolic and systolic heart failure:             -strict Is and Os and daily weight             -no diuretics currently             -see meds above   Filed Weights   02/09/22 0500 02/10/22 0517 02/12/22 0500  Weight: 85.9 kg 86.1 kg 86.5 kg   10/5- will start Lasix 10 mg  daily due to vascualar congestion on CXR  10/6- Per Cards, needs 40 mg daily- will change  10/7 weight appears stable, monitor 14: Hypothyroidism: continue Synthroid 125 mcg daily; follow-up TSH 15: 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   10/3- ate breakfast tray this AM- BG's running from 56 (low yesterday) to 312 yesterday due to foods form home- will ask DM coordinator to help try and manage.   10/4- CBG's mid 100s to mid 200s- a little better in last 24 hours-  no hypoglycemia  10/6- Dm coordinator - increased meals novolog 5 units TID with meals and night/QHS 0-5 SSI  10/7 increase Novolog to 7 units TID  10/8 CBGs elevated last night, a little better today, continue monitor response to insulin adjustment  10/10 increase levemir to 27u  10/11 DM coordinator recommending 10 U novolog TID meal coverage and 25 U levemir BID, will make these changes   10/13 well controlled overall, continue current regimen 17: Class 2 obesity: dietary counseling 18: Tobacco abuse: cessation counseling; continue nicotine patch 19: AKI atop CKD IV: trend BMP  -9/29 Cr improved to 1.88  10/2- Down to 1.59 and BUN 24- will push fluids and recheck in AM  10/3- Cr 1.64 and BUN 24- basically stable- will recheck Thursday-   10/4- ordered BMP for tomorrow  10/5- Cr 2.12 up from 1.64- but required Lasix 40 mg PO x1- actually will start lasix 10 mg daily due to pulm/vascular congestion-   10/6- CR usually runs ~ 2 per Cards note-   -10/10 CR stable at 2.16 20: CAD: stable; treated medically; Dr. Harl Bowie 21: Left foot osteomyelitis s/p ray amputation [name] -recent bone  biopsy; cultures neg - Husband to bring in ?CAM boot for weightbearing, protection - Remove IJ order - 9/30 Noted surgical site dehiscence with PT ambulation, was not wearing orthotic. Podiatry consulted, imaging showing mildly angulated fracture of the distal fourth metatarsal (likely iatrogenic from prior amputation)               - Orders for betadyne dressing daily               - NWB LLE               - Appreciate podiatry recs, to discuss 4th MT fx as group next week  10/2- Podiatry says once has CAM boot to be worn- either from home or for [name] to order- d/w PA-  and will check in to it. Supposedly had a dehiscence but podiatry to see again today.   10/3- podiatry said they'd come back for possible bedside debridement- can WBAT now with CAM boot  10/11 Seen by Podiatry for L foot ulcer,  Iodosorb and gauze dressings daily, WBAT LLE in surgical shoe 22: Leukocytosis: trending down; follow-up CBC  -WBC down to 7/1 on 10/9  Recheck monday 23. Dysphagia  -Advanced to heart healthy/carb modified/ thin liquids            - 9/30 - family requesting diet liberalization as patient primarily eating foods brought in from outside d/t hospital food bland; carb modified diet unable to be ordered with less restrictive parameters. Dietary consulted.   24. RSV  10/2- will take off Droplet precautions- no more Sx's.  25. Chest pain- in setting of severe cardiac issues including CHF with EF 35-40%, CAD- acute on chronic CHF  10/5- CP last night- required NTG x2- EKG had no changes- troponins elevated probably due to pulmonary congestion which was also found- will transfuse 1 unit to help CP; also restart her home Protonix for hx of reflux.  10/10 CP last night, resolved with NTG, troponins not increased above baseline 26, ABLA  10/5- will transfuse 1 unit- think the Hb 7.8 that came back 5 hours later is likely due to hemoconcentration, and isn't real, since her Hb has been trending down for days. On Lovenox  and Brillenta- check Fecal occult for blood loss.   HGB stable at 8.8 on 10/9  Recheck Monday 27. Insomnia  10/5- will increase trazodone to 75 mg QHS per pt request  10/6- slepeing better per pt.   10/12 Increase trazodone to 100mg  QHS  -Improved continue to monitor    LOS: 15 days A FACE TO FACE EVALUATION WAS PERFORMED  Jennye Boroughs 02/12/2022, 9:41 AM

## 2022-02-13 LAB — GLUCOSE, CAPILLARY
Glucose-Capillary: 312 mg/dL — ABNORMAL HIGH (ref 70–99)
Glucose-Capillary: 225 mg/dL — ABNORMAL HIGH (ref 70–99)
Glucose-Capillary: 183 mg/dL — ABNORMAL HIGH (ref 70–99)
Glucose-Capillary: 199 mg/dL — ABNORMAL HIGH (ref 70–99)

## 2022-02-13 MED ORDER — TIZANIDINE HCL 4 MG PO TABS
4.0000 mg | ORAL_TABLET | Freq: Every day | ORAL | Status: DC
  Administered 2022-02-13 – 2022-02-18 (×6): 4 mg via ORAL
  Filled 2022-02-13 (×6): qty 1

## 2022-02-13 MED ORDER — FUROSEMIDE 20 MG PO TABS
20.0000 mg | ORAL_TABLET | Freq: Once | ORAL | Status: AC
  Administered 2022-02-13: 20 mg via ORAL
  Filled 2022-02-13: qty 1

## 2022-02-13 NOTE — Progress Notes (Signed)
Patient has not had a bowel movement in over 72hrs, but is refusing any medication to help with constipation. Nurse will continue to encourage fluids, fiber, and movement.

## 2022-02-13 NOTE — Progress Notes (Signed)
Occupational Therapy Session Note  Patient Details  Name: Lauren [name] MRN: [id number] Date of Birth: August 07, 1980  Today's Date: 02/13/2022 OT Individual Time: 5670-1410 OT Individual Time Calculation (min): 100 min    Short Term Goals: Week 2:  OT Short Term Goal 1 (Week 2): Pt will require set up and min cues for simple grooming sinkside using L hand as gross assist with min cus OT Short Term Goal 1 - Progress (Week 2): Met OT Short Term Goal 2 (Week 2): Pt will recall using hemi techniques for UB and LB clothing management with min cues OT Short Term Goal 2 - Progress (Week 2): Met OT Short Term Goal 3 (Week 2): Pt will consistently attend to L side during functional task 2/3 trials with mod cues OT Short Term Goal 3 - Progress (Week 2): Met OT Short Term Goal 4 (Week 2): Pt will transfer off and on toilet and tub transfer bench with CGA and cues to reduce impulsivity OT Short Term Goal 4 - Progress (Week 2): Met  Skilled Therapeutic Interventions/Progress Updates: Participant asleep in recliner chair upon offer for OT services.   She participated as follows:  Toilet transfer via grab bar, w/c and 3:1 over toilet= stand pivot with CGA  Shower transfer via w/c to/fr tub transfer bench and patient utilizing grab bar on her left = CGA  UB bathing = Min A and hand over hand to use use left hemipleg UE;  UB dressing= moderate assistance Hemipleg trechniques (bra and shirt).  She had difficulty with topographical orientation of both bra and pull over shirt.   LB bathing= lateral leans for buttocks on tub transfer bench, Mod Assist.;  LB dressing, sit to stand in w/c at sink= moderate assitance  Patient was left seated in w/c with alarm belt engaged and call bell and phone within reach in preporation to eat her lunch.  Continue OT POC for this patient.     Therapy Documentation Precautions:  Precautions Precautions: Fall Precaution Comments: L inattention, R gaze preference, L  hemiplegia, no dressing to L foot - wear post-op shoe OOB Required Braces or Orthoses: Other Brace Other Brace: dressing on L foot Restrictions Weight Bearing Restrictions: No      Therapy/Group: Individual Therapy  Alfredia Ferguson [facility] 02/13/2022, 4:31 PM

## 2022-02-13 NOTE — Progress Notes (Signed)
Occupational Therapy Session Note  Patient Details  Name: Lauren [name] MRN: [id number] Date of Birth: [birth year]  Today's Date: 02/13/2022 OT Individual Time: 4128-7867 OT Individual Time Calculation (min): 41 min    Short Term Goals: Week 3:  OT Short Term Goal 1 (Week 3): Pt will stand sinkside for all grooming activity with close s for up to 4- 5 minutes OT Short Term Goal 2 (Week 3): Pt will use RW to access toilet and shower with min A and mod cues for safety, awareness and L sided scanning using L hand walker splint OT Short Term Goal 3 (Week 3): Pt will demonstrate UB dressing with set up only incl pull over bra OT Short Term Goal 4 (Week 3): Pt will perform transfer to shower mirroring home set up with close S and mod cues  Skilled Therapeutic Interventions/Progress Updates:  Skilled OT intervention completed with focus on visual perception and L visual scanning. Pt received seated in w/c, no c/o pain during session. Pt indicated she is frustrated with not being able to see things on her L side, that even at lunch "nursing put a spoon down on the L side and I spent a long time trying to find it to be able to eat."  Pt declined self-care needs at this time. Transported <> gym for time management. Seated at [facility] pt completed the following assessments:  -Bells cancellation- missed 8, 1 error, 10 + mins to complete, auditory stimuli needed for increased attention and scanning to L side however pt was able to recall "I know I need to look to the left but it's hard" -Dot speed test- reaction time 4.42 sec, no cues needed for attending to the L however increased time needed -Picture sequence memory- mod difficulty with attending to first word in the sequence due to L inattention as well as with memorizing sequence as it increased in length -Discussed games on a smart phone that could be useful for scanning and memory/stroke recovery  Back in room, pt remained seated in w/c per request,  belt alarm on, and LUE on stack of blankets per preference for hemi-positioning. All needs met at end of session.   Therapy Documentation Precautions:  Precautions Precautions: Fall Precaution Comments: L inattention, R gaze preference, L hemiplegia, no dressing to L foot - wear post-op shoe OOB Required Braces or Orthoses: Other Brace Other Brace: dressing on L foot Restrictions Weight Bearing Restrictions: No    Therapy/Group: Individual Therapy  Blase Mess, MS, OTR/L  02/13/2022, 3:35 PM

## 2022-02-13 NOTE — Progress Notes (Signed)
PROGRESS NOTE   Subjective/Complaints: She asks for something stronger to help her sleep at night. She has not found the trazodone 100mg  effective. Will try tizanidine 4mg  HS to help her with sleep and muscle spasms  ROS:  +insomnia Review of Systems  Constitutional:  Negative for chills and fever.  HENT:  Negative for congestion.   Respiratory:  Negative for sputum production and shortness of breath.   Cardiovascular:  Negative for chest pain and palpitations.  Gastrointestinal:  Negative for abdominal pain.  Neurological:  Negative for headaches.       Objective:   DG Foot Complete Right  Result Date: 02/12/2022 CLINICAL DATA:  Pain metatarsus of right foot.  Toe pain. EXAM: RIGHT FOOT COMPLETE - 3+ VIEW COMPARISON:  04/03/2019 FINDINGS: No acute fracture. Chronic irregularity about the fourth toe proximal phalanx may represent sequela of remote injury. There is chronic flattening of the third metatarsal head. Chronic fragmentation of the second metatarsal head. No erosions or bony destructive change. No dislocation. Prominent vascular calcifications, age advanced. No soft tissue gas. IMPRESSION: 1. No acute osseous abnormality. 2. Chronic flattening of the third metatarsal head and fragmentation of the second metatarsal head. Chronic regularity of the fourth toe proximal phalanx may represent sequela of remote injury. Electronically Signed   By: Keith Rake M.D.   On: 02/12/2022 19:42    No results for input(s): "WBC", "HGB", "HCT", "PLT" in the last 72 hours.  No results for input(s): "NA", "K", "CL", "CO2", "GLUCOSE", "BUN", "CREATININE", "CALCIUM" in the last 72 hours.   Intake/Output Summary (Last 24 hours) at 02/13/2022 1410 Last data filed at 02/12/2022 1826 Gross per 24 hour  Intake 240 ml  Output --  Net 240 ml          Physical Exam: Vital Signs Blood pressure 119/79, pulse 85, temperature 98.5 F  (36.9 C), temperature source Oral, resp. rate 18, weight 88.2 kg, last menstrual period 11/01/2021, SpO2 93 %.   Gen: no distress, normal appearing HEENT: oral mucosa pink and moist, NCAT Cardio: Reg rate Chest: normal effort, normal rate of breathing Abd: soft, non-distended Ext: no edema Psych: pleasant, normal affect Skin: intact Neurological: awake and alert and oriented x4 L foot with dressing wrapped in ace wrap- C/D/I   PE from prior encounter: Skin: + L 5th toe amputation; wound site open with mild serous drainage, clean wound bed, no erythema or warmth.  Seen wrapped up in ACE wrap- C/D/I MSK:      No apparent deformity.      Strength:                RUE: 5/5 SA, 5/5 EF, 5/5 EE, 5/5 WE, 5/5 FF, 5/5 FA                 LUE: flaccid, 0/5 throughout                RLE: 5/5 HF, 5/5 KE, 5/5 DF, 5/5 EHL, 5/5 PF                 LLE:  3/5 HF, 2/5 KE, 0/5 DF, 0/5 EHL, 1/5 PF    Neurologic exam:  Cognition: AAO to person, place, time and event.  Language: Fluent, No substitutions or neoglisms.moderate dysarthria Mood: Pleasant affect, appropriate mood.  Sensation: To light touch intact in BL UEs and LEs  CN: Left facial droop   Assessment/Plan: 1. Functional deficits which require 3+ hours per day of interdisciplinary therapy in a comprehensive inpatient rehab setting. Physiatrist is providing close team supervision and 24 hour management of active medical problems listed below. Physiatrist and rehab team continue to assess barriers to discharge/monitor patient progress toward functional and medical goals  Care Tool:  Bathing    Body parts bathed by patient: Right arm, Left arm, Chest, Abdomen, Buttocks, Right upper leg, Left upper leg, Face, Front perineal area, Right lower leg   Body parts bathed by helper: Front perineal area, Right lower leg, Left lower leg Body parts n/a: Left lower leg   Bathing assist Assist Level: Moderate Assistance - Patient 50 - 74%      Upper Body Dressing/Undressing Upper body dressing   What is the patient wearing?: Bra, Pull over shirt    Upper body assist Assist Level: Moderate Assistance - Patient 50 - 74%    Lower Body Dressing/Undressing Lower body dressing      What is the patient wearing?: Incontinence brief, Pants     Lower body assist Assist for lower body dressing: Moderate Assistance - Patient 50 - 74%     Toileting Toileting Toileting Activity did not occur (Clothing management and hygiene only): N/A (no void or bm)  Toileting assist Assist for toileting: Minimal Assistance - Patient > 75%     Transfers Chair/bed transfer  Transfers assist     Chair/bed transfer assist level: Moderate Assistance - Patient 50 - 74%     Locomotion Ambulation   Ambulation assist   Ambulation activity did not occur: Safety/medical concerns  Assist level: Minimal Assistance - Patient > 75% Assistive device: Walker-rolling (left hand splint) Max distance: 55 ft   Walk 10 feet activity   Assist  Walk 10 feet activity did not occur: Safety/medical concerns  Assist level: Minimal Assistance - Patient > 75% Assistive device: Walker-rolling   Walk 50 feet activity   Assist Walk 50 feet with 2 turns activity did not occur: Safety/medical concerns  Assist level: Minimal Assistance - Patient > 75% Assistive device: Walker-rolling    Walk 150 feet activity   Assist Walk 150 feet activity did not occur: Safety/medical concerns         Walk 10 feet on uneven surface  activity   Assist Walk 10 feet on uneven surfaces activity did not occur: Safety/medical concerns         Wheelchair     Assist Is the patient using a wheelchair?: Yes Type of Wheelchair: Manual    Wheelchair assist level: Total Assistance - Patient < 25% Max wheelchair distance: 300 ft    Wheelchair 50 feet with 2 turns activity    Assist        Assist Level: Total Assistance - Patient < 25%    Wheelchair 150 feet activity     Assist      Assist Level: Total Assistance - Patient < 25%   Blood pressure 119/79, pulse 85, temperature 98.5 F (36.9 C), temperature source Oral, resp. rate 18, weight 88.2 kg, last menstrual period 11/01/2021, SpO2 93 %.  Medical Problem List and Plan: 1. 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             -patient may shower with lines and foot wound covered             -ELOS/Goals: 14-21 days  WBAT On LLE D/c 10/20 Continue CIR- PT and OT and SLP -15/7 discontinued -She walked 55 feet total during session with PT yesterday 2.  Antithrombotics: -DVT/anticoagulation:  Pharmaceutical: Lovenox             -antiplatelet therapy: Brilinta 3. Pain Management: continue Tylenol, oxycodone as needed. Left arm sling ordered.  4. Insomnia: LCSW to evaluate and provide support             -continue melatonin 3 mg q HS  Replace trazodone with tizanidine HS             -antipsychotic agents: n/a 5. Neuropsych/cognition: This patient is capable of making decisions on her own behalf. 6. 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 Fluids/Electrolytes/Nutrition: Strict Is and Os and follow-up chemistries             -dys 3/thin liquids; SLP eval             -repeat mag/phos tomorrow 8: Right ICA dissection with stent placement: on Brilinta 9: Hypertension: monitor             -continue hydralazine 12.5 mg TID             -continue isosorbide 20 mg BID             -continue Lopressor 37.5 mg BID  10/2- BP is controlled- con't regimen  10/3- had BP of 80s/50s yesterday- hydralazine stopped- is better this AM- will see how does in therapy with standing- concern will drop  10/4- will decrease Metoprolol to 25 mg BID- not change to succinate, since won't  have ability to change meds as often.   10/5- BP overnight 110s/120s supine- will see how tolerates therapy today.   10/6- BP doing better, they increased her B blocker back to 37.5 mg BID   10/13 well controlled, continue to monitor    02/13/2022    7:55 AM 02/13/2022    5:14 AM 02/13/2022    4:53 AM  Vitals with BMI  Weight  194 lbs 7 oz   Systolic 330  076  Diastolic 79  64  Pulse 85  89    10: Hyperlipidemia: continue Lipitor 40 mg daily 11: Coagulopathy s/p RUE embolectomy 10/2021: Eliquis PTA now on Lovenox; f                -follow-up hematology as outpt 12: COPD exacerbation: hypoxia resolved; continue bronchodilators/nebs 13: Chronic diastolic and systolic heart failure:             -strict Is and Os and daily weight             -no diuretics currently             -see meds above   Filed Weights   02/10/22 0517 02/12/22 0500 02/13/22 0514  Weight: 86.1 kg 86.5 kg 88.2 kg   10/5- will start Lasix 10 mg daily due to vascualar congestion on CXR  10/6- Per Cards, needs 40 mg daily- will change  10/7 weight appears stable, monitor  10/14: weight is up 2kg, add additional 20mg  lasix today 14: Hypothyroidism: continue Synthroid 125  mcg daily; follow-up TSH 15: 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   10/3- ate breakfast tray this AM- BG's running from 56 (low yesterday) to 312 yesterday due to foods form home- will ask DM coordinator to help try and manage.   10/4- CBG's mid 100s to mid 200s- a little better in last 24 hours- no hypoglycemia  10/6- Dm coordinator - increased meals novolog 5 units TID with meals and night/QHS 0-5  SSI  10/7 increase Novolog to 7 units TID  10/8 CBGs elevated last night, a little better today, continue monitor response to insulin adjustment  10/10 increase levemir to 27u  10/11 DM coordinator recommending 10 U novolog TID meal coverage and 25 U levemir BID, will make these changes   10/13 well controlled overall, continue current regimen 17: Class 2 obesity: dietary counseling 18: Tobacco abuse: cessation counseling; continue nicotine patch 19: AKI atop CKD IV: trend BMP  -9/29 Cr improved to 1.88  10/2- Down to 1.59 and BUN 24- will push fluids and recheck in AM  10/3- Cr 1.64 and BUN 24- basically stable- will recheck Thursday-   10/4- ordered BMP for tomorrow  10/5- Cr 2.12 up from 1.64- but required Lasix 40 mg PO x1- actually will start lasix 10 mg daily due to pulm/vascular congestion-   10/6- CR usually runs ~ 2 per Cards note-   -10/10 CR stable at 2.16 20: CAD: stable; treated medically; Dr. Harl Bowie 21: Left foot osteomyelitis s/p ray amputation [name] -recent bone biopsy; cultures neg - Husband to bring in ?CAM boot for weightbearing, protection - Remove IJ order - 9/30 Noted surgical site dehiscence with PT ambulation, was not wearing orthotic. Podiatry consulted, imaging showing mildly angulated fracture of the distal fourth metatarsal (likely iatrogenic from prior amputation)               - Orders for betadyne dressing daily               - NWB LLE               - Appreciate podiatry recs, to discuss 4th MT fx as group next week  10/2- Podiatry says once has CAM boot to be worn- either from home or for [name] to order- d/w PA-  and will check in to it. Supposedly had a dehiscence but podiatry to see again today.   10/3- podiatry said they'd come back for possible bedside debridement- can WBAT now with CAM boot  10/11 Seen by Podiatry for L foot ulcer,  Iodosorb and gauze dressings daily, WBAT LLE in surgical shoe 22: Leukocytosis: trending down; follow-up CBC  -WBC  down to 7/1 on 10/9  Recheck monday 23. Dysphagia  -Advanced to heart healthy/carb modified/ thin liquids            - 9/30 - family requesting diet liberalization as patient primarily eating foods brought in from outside d/t hospital food bland; carb modified diet unable  to be ordered with less restrictive parameters. Dietary consulted.   24. RSV  10/2- will take off Droplet precautions- no more Sx's.  25. Chest pain- in setting of severe cardiac issues including CHF with EF 35-40%, CAD- acute on chronic CHF  10/5- CP last night- required NTG x2- EKG had no changes- troponins elevated probably due to pulmonary congestion which was also found- will transfuse 1 unit to help CP; also restart her home Protonix for hx of reflux.  10/10 CP last night, resolved with NTG, troponins not increased above baseline 26, ABLA  10/5- will transfuse 1 unit- think the Hb 7.8 that came back 5 hours later is likely due to hemoconcentration, and isn't real, since her Hb has been trending down for days. On Lovenox and Brillenta- check Fecal occult for blood loss.   HGB stable at 8.8 on 10/9   Recheck Monday 27. Insomnia  10/5- will increase trazodone to 75 mg QHS per pt request  10/6- slepeing better per pt.   10/12 Increase trazodone to 100mg  QHS  -Improved continue to monitor    LOS: 16 days A FACE TO FACE EVALUATION WAS PERFORMED  Jemari Hallum P Robson Trickey 02/13/2022, 2:10 PM

## 2022-02-14 LAB — GLUCOSE, CAPILLARY
Glucose-Capillary: 267 mg/dL — ABNORMAL HIGH (ref 70–99)
Glucose-Capillary: 291 mg/dL — ABNORMAL HIGH (ref 70–99)
Glucose-Capillary: 230 mg/dL — ABNORMAL HIGH (ref 70–99)
Glucose-Capillary: 168 mg/dL — ABNORMAL HIGH (ref 70–99)

## 2022-02-14 MED ORDER — INSULIN DETEMIR 100 UNIT/ML ~~LOC~~ SOLN
27.0000 [IU] | Freq: Two times a day (BID) | SUBCUTANEOUS | Status: DC
  Administered 2022-02-14 – 2022-02-17 (×6): 27 [IU] via SUBCUTANEOUS
  Filled 2022-02-14 (×9): qty 0.27

## 2022-02-14 MED ORDER — MAGNESIUM GLUCONATE 500 MG PO TABS
250.0000 mg | ORAL_TABLET | Freq: Every day | ORAL | Status: DC
  Administered 2022-02-14 – 2022-02-18 (×5): 250 mg via ORAL
  Filled 2022-02-14 (×5): qty 1

## 2022-02-14 MED ORDER — RAMELTEON 8 MG PO TABS
8.0000 mg | ORAL_TABLET | Freq: Every day | ORAL | Status: DC
  Administered 2022-02-14 – 2022-02-18 (×5): 8 mg via ORAL
  Filled 2022-02-14 (×5): qty 1

## 2022-02-14 NOTE — Progress Notes (Signed)
PROGRESS NOTE   Subjective/Complaints: Mrs. Kuznicki notes that she was still unable to sleep last night with tizanidine. She asks if her sleeping medication can be given earlier as she likes to sleep by 7pm.   ROS:  +insomnia- continues Review of Systems  Constitutional:  Negative for chills and fever.  HENT:  Negative for congestion.   Respiratory:  Negative for sputum production and shortness of breath.   Cardiovascular:  Negative for chest pain and palpitations.  Gastrointestinal:  Negative for abdominal pain.  Neurological:  Negative for headaches.       Objective:   DG Foot Complete Right  Result Date: 02/12/2022 CLINICAL DATA:  Pain metatarsus of right foot.  Toe pain. EXAM: RIGHT FOOT COMPLETE - 3+ VIEW COMPARISON:  04/03/2019 FINDINGS: No acute fracture. Chronic irregularity about the fourth toe proximal phalanx may represent sequela of remote injury. There is chronic flattening of the third metatarsal head. Chronic fragmentation of the second metatarsal head. No erosions or bony destructive change. No dislocation. Prominent vascular calcifications, age advanced. No soft tissue gas. IMPRESSION: 1. No acute osseous abnormality. 2. Chronic flattening of the third metatarsal head and fragmentation of the second metatarsal head. Chronic regularity of the fourth toe proximal phalanx may represent sequela of remote injury. Electronically Signed   By: Keith Rake M.D.   On: 02/12/2022 19:42    No results for input(s): "WBC", "HGB", "HCT", "PLT" in the last 72 hours.  No results for input(s): "NA", "K", "CL", "CO2", "GLUCOSE", "BUN", "CREATININE", "CALCIUM" in the last 72 hours.   Intake/Output Summary (Last 24 hours) at 02/14/2022 0918 Last data filed at 02/14/2022 0855 Gross per 24 hour  Intake 774 ml  Output --  Net 774 ml          Physical Exam: Vital Signs Blood pressure (!) 127/56, pulse 86, temperature  98 F (36.7 C), temperature source Oral, resp. rate 18, weight 85.5 kg, last menstrual period 11/01/2021, SpO2 98 %.  Gen: no distress, normal appearing HEENT: oral mucosa pink and moist, NCAT Cardio: Reg rate Chest: normal effort, normal rate of breathing Abd: soft, non-distended Ext: no edema Psych: pleasant, normal affect Skin: intact  Neurological: awake and alert and oriented x4 L foot with dressing wrapped in ace wrap- C/D/I   PE from prior encounter: Skin: + L 5th toe amputation; wound site open with mild serous drainage, clean wound bed, no erythema or warmth.  Seen wrapped up in ACE wrap- C/D/I MSK:      No apparent deformity.      Strength:                RUE: 5/5 SA, 5/5 EF, 5/5 EE, 5/5 WE, 5/5 FF, 5/5 FA                 LUE: flaccid, 0/5 throughout                RLE: 5/5 HF, 5/5 KE, 5/5 DF, 5/5 EHL, 5/5 PF                 LLE:  3/5 HF, 2/5 KE, 0/5 DF, 0/5 EHL, 1/5 PF    Neurologic  exam:  Cognition: AAO to person, place, time and event.  Language: Fluent, No substitutions or neoglisms.moderate dysarthria Mood: Pleasant affect, appropriate mood.  Sensation: To light touch intact in BL UEs and LEs  CN: Left facial droop   Assessment/Plan: 1. Functional deficits which require 3+ hours per day of interdisciplinary therapy in a comprehensive inpatient rehab setting. Physiatrist is providing close team supervision and 24 hour management of active medical problems listed below. Physiatrist and rehab team continue to assess barriers to discharge/monitor patient progress toward functional and medical goals  Care Tool:  Bathing    Body parts bathed by patient: Right arm, Left arm, Chest, Abdomen, Buttocks, Right upper leg, Left upper leg, Face, Front perineal area, Right lower leg   Body parts bathed by helper: Front perineal area, Right lower leg, Left lower leg Body parts n/a: Left lower leg   Bathing assist Assist Level: Moderate Assistance - Patient 50 - 74%      Upper Body Dressing/Undressing Upper body dressing   What is the patient wearing?: Bra, Pull over shirt    Upper body assist Assist Level: Moderate Assistance - Patient 50 - 74%    Lower Body Dressing/Undressing Lower body dressing      What is the patient wearing?: Incontinence brief, Pants     Lower body assist Assist for lower body dressing: Moderate Assistance - Patient 50 - 74%     Toileting Toileting Toileting Activity did not occur (Clothing management and hygiene only): N/A (no void or bm)  Toileting assist Assist for toileting: Minimal Assistance - Patient > 75%     Transfers Chair/bed transfer  Transfers assist     Chair/bed transfer assist level: Moderate Assistance - Patient 50 - 74%     Locomotion Ambulation   Ambulation assist   Ambulation activity did not occur: Safety/medical concerns  Assist level: Minimal Assistance - Patient > 75% Assistive device: Walker-rolling (left hand splint) Max distance: 55 ft   Walk 10 feet activity   Assist  Walk 10 feet activity did not occur: Safety/medical concerns  Assist level: Minimal Assistance - Patient > 75% Assistive device: Walker-rolling   Walk 50 feet activity   Assist Walk 50 feet with 2 turns activity did not occur: Safety/medical concerns  Assist level: Minimal Assistance - Patient > 75% Assistive device: Walker-rolling    Walk 150 feet activity   Assist Walk 150 feet activity did not occur: Safety/medical concerns         Walk 10 feet on uneven surface  activity   Assist Walk 10 feet on uneven surfaces activity did not occur: Safety/medical concerns         Wheelchair     Assist Is the patient using a wheelchair?: Yes Type of Wheelchair: Manual    Wheelchair assist level: Total Assistance - Patient < 25% Max wheelchair distance: 300 ft    Wheelchair 50 feet with 2 turns activity    Assist        Assist Level: Total Assistance - Patient < 25%    Wheelchair 150 feet activity     Assist      Assist Level: Total Assistance - Patient < 25%   Blood pressure (!) 127/56, pulse 86, temperature 98 F (36.7 C), temperature source Oral, resp. rate 18, weight 85.5 kg, last menstrual period 11/01/2021, SpO2 98 %.  Medical Problem List and Plan: 1. 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             -patient may shower with lines and foot wound covered             -ELOS/Goals: 14-21 days  WBAT On LLE D/c 10/20 Continue CIR- PT and OT and SLP -15/7 discontinued -She walked 55 feet total during session with PT yesterday 2.  Antithrombotics: -DVT/anticoagulation:  Pharmaceutical: Lovenox             -antiplatelet therapy: Brilinta 3. Pain Management: continue Tylenol, oxycodone as needed. Left arm sling ordered.  4. Insomnia: LCSW to evaluate and provide support             Replace tizanidine with ramelteon and magnesium supplement at 7pm when patient likes to sleep             -antipsychotic agents: n/a 5. Neuropsych/cognition: This patient is capable of making decisions on her own behalf. 6. 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 Fluids/Electrolytes/Nutrition: Strict Is and Os and follow-up chemistries             -dys 3/thin liquids; SLP eval             -repeat mag/phos tomorrow 8: Right ICA dissection with stent placement: on Brilinta 9: Hypertension: monitor             -continue hydralazine 12.5 mg TID             -continue isosorbide 20 mg BID             -continue Lopressor 37.5 mg BID  10/2- BP is controlled- con't regimen  10/3- had BP of 80s/50s yesterday- hydralazine stopped- is better this AM- will see how does in therapy with standing- concern will drop  10/4- will decrease Metoprolol to 25 mg BID- not change  to succinate, since won't have ability to change meds as often.   10/5- BP overnight 110s/120s supine- will see how tolerates therapy today.   10/6- BP doing better, they increased her B blocker back to 37.5 mg BID   10/13 well controlled, continue to monitor    02/14/2022    5:02 AM 02/14/2022    4:01 AM 02/13/2022   10:29 PM  Vitals with BMI  Weight 188 lbs 8 oz    Systolic  315 400  Diastolic  56 62  Pulse  86 89    10: Hyperlipidemia: continue Lipitor 40 mg daily 11: Coagulopathy s/p RUE embolectomy 10/2021: Eliquis PTA now on Lovenox; f                -follow-up hematology as outpt 12: COPD exacerbation: hypoxia resolved; continue bronchodilators/nebs 13: Chronic diastolic and systolic heart failure:             -strict Is and Os and daily weight             -no diuretics currently             -see meds above   Filed Weights   02/12/22 0500 02/13/22 0514 02/14/22 0502  Weight: 86.5 kg 88.2 kg 85.5 kg   10/5- will start Lasix 10 mg daily due to vascualar congestion on CXR  10/6- Per Cards, needs 40 mg daily- will change  10/7 weight appears stable, monitor  10/14: weight is up 2kg, add additional 20mg  lasix today 14:  Hypothyroidism: continue Synthroid 125 mcg daily; follow-up TSH 15: 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   10/3- ate breakfast tray this AM- BG's running from 56 (low yesterday) to 312 yesterday due to foods form home- will ask DM coordinator to help try and manage.   10/4- CBG's mid 100s to mid 200s- a little better in last 24 hours- no hypoglycemia  10/6- Dm coordinator - increased meals novolog 5 units TID with meals  and night/QHS 0-5 SSI  10/7 increase Novolog to 7 units TID  10/8 CBGs elevated last night, a little better today, continue monitor response to insulin adjustment  10/10 increase levemir to 27u  10/11 DM coordinator recommending 10 U novolog TID meal coverage and 25 U levemir BID, will make these changes   10/13 well controlled overall, continue current regimen 17: Class 2 obesity: dietary counseling 18: Tobacco abuse: cessation counseling; continue nicotine patch 19: AKI atop CKD IV: trend BMP  -9/29 Cr improved to 1.88  10/2- Down to 1.59 and BUN 24- will push fluids and recheck in AM  10/3- Cr 1.64 and BUN 24- basically stable- will recheck Thursday-   10/4- ordered BMP for tomorrow  10/5- Cr 2.12 up from 1.64- but required Lasix 40 mg PO x1- actually will start lasix 10 mg daily due to pulm/vascular congestion-   10/6- CR usually runs ~ 2 per Cards note-   -10/10 CR stable at 2.16 20: CAD: stable; treated medically; Dr. Harl Bowie 21: Left foot osteomyelitis s/p ray amputation [name] -recent bone biopsy; cultures neg - Husband to bring in ?CAM boot for weightbearing, protection - Remove IJ order - 9/30 Noted surgical site dehiscence with PT ambulation, was not wearing orthotic. Podiatry consulted, imaging showing mildly angulated fracture of the distal fourth metatarsal (likely iatrogenic from prior amputation)               - Orders for betadyne dressing daily               - NWB LLE               - Appreciate podiatry recs, to discuss 4th MT fx as group next week  10/2- Podiatry says once has CAM boot to be worn- either from home or for [name] to order- d/w PA-  and will check in to it. Supposedly had a dehiscence but podiatry to see again today.   10/3- podiatry said they'd come back for possible bedside debridement- can WBAT now with CAM boot  10/11 Seen by Podiatry for L foot ulcer,  Iodosorb and gauze dressings daily, WBAT LLE in surgical shoe 22: Leukocytosis: trending down;  follow-up CBC  -WBC down to 7/1 on 10/9  Recheck monday 23. Dysphagia  -Advanced to heart healthy/carb modified/ thin liquids            - 9/30 - family requesting diet liberalization as patient primarily eating foods brought in from outside d/t hospital food bland;  carb modified diet unable to be ordered with less restrictive parameters. Dietary consulted.   24. RSV  10/2- will take off Droplet precautions- no more Sx's.  25. Chest pain- in setting of severe cardiac issues including CHF with EF 35-40%, CAD- acute on chronic CHF  10/5- CP last night- required NTG x2- EKG had no changes- troponins elevated probably due to pulmonary congestion which was also found- will transfuse 1 unit to help CP; also restart her home Protonix for hx of reflux.  10/10 CP last night, resolved with NTG, troponins not increased above baseline 26, ABLA  10/5- will transfuse 1 unit- think the Hb 7.8 that came back 5 hours later is likely due to hemoconcentration, and isn't real, since her Hb has been trending down for days. On Lovenox and Brillenta- check Fecal occult for blood loss.   HGB stable at 8.8 on 10/9   Recheck Monday 27. Insomnia  10/5- will increase trazodone to 75 mg QHS per pt request  10/6- slepeing better per pt.   10/12 Increase trazodone to 100mg  QHS  -Improved continue to monitor    LOS: 17 days A FACE TO FACE EVALUATION WAS PERFORMED  Garrell Flagg P Haydn Hutsell 02/14/2022, 9:18 AM

## 2022-02-14 NOTE — Progress Notes (Signed)
Physical Therapy Session Note  Patient Details  Name: Lauren [name] MRN: [id number] Date of Birth: Sep 30, 1980  Today's Date: 02/14/2022 PT Individual Time: 3267-1245 PT Individual Time Calculation (min): 63 min   Short Term Goals: Week 2:  PT Short Term Goal 1 (Week 2): Pt will ambulate 30 ft min A PT Short Term Goal 2 (Week 2): Pt will perform bed to chair transfer with CGA or less PT Short Term Goal 3 (Week 2): Pt will require min A for dynamic standing  Skilled Therapeutic Interventions/Progress Updates:      Therapy Documentation Precautions:  Precautions Precautions: Fall Precaution Comments: L inattention, R gaze preference, L hemiplegia, no dressing to L foot - wear post-op shoe OOB Required Braces or Orthoses: Other Brace Other Brace: dressing on L foot Restrictions Weight Bearing Restrictions: No  Pt received seated in recliner at bedside with family present. Pt report continous baseline headache and agreeable to PT session with emphasis on gait training. Pt requested to void and CGA with stand piovt transfer from w/c to toilet and for static standing while PT performed peri-care. Pt transported by w/c for time management and energy conservation to main gym. Pt ambulated with RW and left hand splint and requires min A for balance and verbal cues for weight shifting and to correct left lateral lean. Pt ambulated 20 + 10+ 18+ 7+7 ft with close w/c follow. Pt transported to room and performed CGA stand pivot transfer with sister to edge of bed and requires (S) for sit to lying. Pt left semi-reclined in bed with all needs in reach and bed alarm on.    Therapy/Group: Individual Therapy  Verl Dicker Verl Dicker PT, DPT  02/14/2022, 7:55 AM

## 2022-02-14 NOTE — Progress Notes (Signed)
Physical Therapy Weekly Progress Note  Patient Details  Name: Lauren [name] MRN: [id number] Date of Birth: 05-08-1980  Beginning of progress report period: January 29, 2022 End of progress report period: February 14, 2022  Patient has met  6 of 6 short term goals.  Pt is making steady progress to long term goals due to improvements in balance, coordination, strength, and activity tolerance. Pt requires (S) with bed mobility, CGA for transfers, and min A for gait. Pt's spouse is building ramp as pt has exterior steps. Plan to practice bed mobility from elevated height as pt has a step up to get into bed. Pt family has been consistent in participating in family education and plan to continue to better prepare for discharge.   Patient continues to demonstrate the following deficits muscle weakness, decreased cardiorespiratoy endurance, impaired timing and sequencing, unbalanced muscle activation, decreased coordination, and decreased motor planning, decreased midline orientation, decreased attention to left, and decreased motor planning, decreased attention, decreased awareness, decreased problem solving, decreased safety awareness, and delayed processing, and decreased standing balance, decreased postural control, and decreased balance strategies and therefore will continue to benefit from skilled PT intervention to increase functional independence with mobility.  Patient progressing toward long term goals..  Continue plan of care.  PT Short Term Goals Week 1:  PT Short Term Goal 1 (Week 1): Pt will perform bed mobility with increased attn to LUE and consistent MinA. PT Short Term Goal 1 - Progress (Week 1): Met PT Short Term Goal 2 (Week 1): pt will perform sit<>stand with CGA and improved midline positioning. PT Short Term Goal 2 - Progress (Week 1): Met PT Short Term Goal 3 (Week 1): Pt will perform stand pivot transfers with consistent minA. PT Short Term Goal 3 - Progress (Week 1): Met PT  Short Term Goal 4 (Week 1): Pt will initiate gait training using LRAD. PT Short Term Goal 4 - Progress (Week 1): Discontinued (comment) (not appropriate) PT Short Term Goal 5 (Week 1): Pt will initiate stair training. PT Short Term Goal 5 - Progress (Week 1): Discontinued (comment) (not appropriate) Week 2:  PT Short Term Goal 1 (Week 2): Pt will ambulate 30 ft min A PT Short Term Goal 1 - Progress (Week 2): Met PT Short Term Goal 2 (Week 2): Pt will perform bed to chair transfer with CGA or less PT Short Term Goal 2 - Progress (Week 2): Met PT Short Term Goal 3 (Week 2): Pt will require min A for dynamic standing PT Short Term Goal 3 - Progress (Week 2): Met Week 3:  PT Short Term Goal 1 (Week 3): STG=LTG due to ELOS  Skilled Therapeutic Interventions/Progress Updates:      Therapy Documentation Precautions:  Precautions Precautions: Fall Precaution Comments: L inattention, R gaze preference, L hemiplegia, no dressing to L foot - wear post-op shoe OOB Required Braces or Orthoses: Other Brace Other Brace: dressing on L foot Restrictions Weight Bearing Restrictions: No    Therapy/Group: Individual Therapy  Verl Dicker Verl Dicker PT, DPT  02/14/2022, 3:54 PM

## 2022-02-14 NOTE — Progress Notes (Signed)
Occupational Therapy Session Note  Patient Details  Name: Lauren [name] MRN: [id number] Date of Birth: 1981-04-27  Today's Date: 02/14/2022 OT Individual Time: 1000-1052 OT Individual Time Calculation (min): 52 min    Short Term Goals: Week 3:  OT Short Term Goal 1 (Week 3): Pt will stand sinkside for all grooming activity with close s for up to 4- 5 minutes OT Short Term Goal 2 (Week 3): Pt will use RW to access toilet and shower with min A and mod cues for safety, awareness and L sided scanning using L hand walker splint OT Short Term Goal 3 (Week 3): Pt will demonstrate UB dressing with set up only incl pull over bra OT Short Term Goal 4 (Week 3): Pt will perform transfer to shower mirroring home set up with close S and mod cues  Skilled Therapeutic Interventions/Progress Updates:    Upon OT arrival, pt seated in recliner resting reporting headache but declines any medication. Pt agreeable to OT treatment session. Treatment intervention with a focus on self care retraining and functional transfers. Pt completes stand pivot transfer to the w/c with CGA and was transported infront of sink to change shirt with Min A and brush teeth with SBA. Pt requires increased time to complete both tasks. Pt's hair was brushed and put into a hair tie with Total A. Pt engages in transfer training completing 8 sit<>stands with SBA at a slow and controlled pace focusing on her balance and positioning. Pt reports dizziness and requests her BP to be checked. Pt's BP read 151/99. Pt was transported back to her recliner and completes stand pivot transfer with CGA to recliner. Pt was left in recliner at end of session with all needs met and safety measures in place.   Therapy Documentation Precautions:  Precautions Precautions: Fall Precaution Comments: L inattention, R gaze preference, L hemiplegia, no dressing to L foot - wear post-op shoe OOB Required Braces or Orthoses: Other Brace Other Brace: dressing on  L foot Restrictions Weight Bearing Restrictions: No    Therapy/Group: Individual Therapy  Marvetta Gibbons 02/14/2022, 12:29 PM

## 2022-02-15 LAB — GLUCOSE, CAPILLARY
Glucose-Capillary: 388 mg/dL — ABNORMAL HIGH (ref 70–99)
Glucose-Capillary: 350 mg/dL — ABNORMAL HIGH (ref 70–99)
Glucose-Capillary: 213 mg/dL — ABNORMAL HIGH (ref 70–99)
Glucose-Capillary: 228 mg/dL — ABNORMAL HIGH (ref 70–99)

## 2022-02-15 LAB — BASIC METABOLIC PANEL
Anion gap: 11 (ref 5–15)
BUN: 39 mg/dL — ABNORMAL HIGH (ref 6–20)
CO2: 23 mmol/L (ref 22–32)
Calcium: 8.6 mg/dL — ABNORMAL LOW (ref 8.9–10.3)
Chloride: 99 mmol/L (ref 98–111)
Creatinine, Ser: 2.47 mg/dL — ABNORMAL HIGH (ref 0.44–1.00)
GFR, Estimated: 25 mL/min — ABNORMAL LOW (ref >60)
Glucose, Bld: 395 mg/dL — ABNORMAL HIGH (ref 70–99)
Potassium: 4.4 mmol/L (ref 3.5–5.1)
Sodium: 133 mmol/L — ABNORMAL LOW (ref 135–145)

## 2022-02-15 LAB — CBC
HCT: 28.9 % — ABNORMAL LOW (ref 36.0–46.0)
Hemoglobin: 9.3 g/dL — ABNORMAL LOW (ref 12.0–15.0)
MCH: 27.6 pg (ref 26.0–34.0)
MCHC: 32.2 g/dL (ref 30.0–36.0)
MCV: 85.8 fL (ref 80.0–100.0)
Platelets: 435 10*3/uL — ABNORMAL HIGH (ref 150–400)
RBC: 3.37 MIL/uL — ABNORMAL LOW (ref 3.87–5.11)
RDW: 15 % (ref 11.5–15.5)
WBC: 9.2 10*3/uL (ref 4.0–10.5)
nRBC: 0 % (ref 0.0–0.2)

## 2022-02-15 LAB — OCCULT BLOOD X 1 CARD TO LAB, STOOL: Fecal Occult Bld: NEGATIVE

## 2022-02-15 MED ORDER — FUROSEMIDE 20 MG PO TABS
20.0000 mg | ORAL_TABLET | Freq: Every day | ORAL | Status: DC
  Administered 2022-02-16 – 2022-02-17 (×2): 20 mg via ORAL
  Filled 2022-02-15 (×2): qty 1

## 2022-02-15 NOTE — Progress Notes (Signed)
Occupational Therapy Session Note  Patient Details  Name: Lauren [name] MRN: [id number] Date of Birth: [birth year]  Today's Date: 02/15/2022 OT Individual Time: 3335-4562 OT Individual Time Calculation (min): 29 min    Short Term Goals: Week 3:  OT Short Term Goal 1 (Week 3): Pt will stand sinkside for all grooming activity with close s for up to 4- 5 minutes OT Short Term Goal 2 (Week 3): Pt will use RW to access toilet and shower with min A and mod cues for safety, awareness and L sided scanning using L hand walker splint OT Short Term Goal 3 (Week 3): Pt will demonstrate UB dressing with set up only incl pull over bra OT Short Term Goal 4 (Week 3): Pt will perform transfer to shower mirroring home set up with close S and mod cues  Skilled Therapeutic Interventions/Progress Updates:    Pt sitting in w/c upon arrival eating breakfast. Pt initially fixated on locating her phone. After phone recovered, OT intervention with focus on self feeding and attention to task and attention to Lt. Mod verbal cues for attention to task (eating breakfast) and min verbal cues to attend to Lt to locate items on breakfast tray and table. Pt remained in w/c with all needs within reach. Belt alarm activated.  Therapy Documentation Precautions:  Precautions Precautions: Fall Precaution Comments: L inattention, R gaze preference, L hemiplegia, no dressing to L foot - wear post-op shoe OOB Required Braces or Orthoses: Other Brace Other Brace: dressing on L foot Restrictions Weight Bearing Restrictions: No  Pain: Pt denies pain this morning   Therapy/Group: Individual Therapy  Leroy Libman 02/15/2022, 8:45 AM

## 2022-02-15 NOTE — Progress Notes (Signed)
Physical Therapy Session Note  Patient Details  Name: Lauren [name] MRN: [id number] Date of Birth: 07-04-1980  Today's Date: 02/15/2022 PT Individual Time: 1000-1116 PT Individual Time Calculation (min): 76 min   Short Term Goals: Week 3:  PT Short Term Goal 1 (Week 3): STG=LTG due to ELOS  Skilled Therapeutic Interventions/Progress Updates:      Therapy Documentation Precautions:  Precautions Precautions: Fall Precaution Comments: L inattention, R gaze preference, L hemiplegia, no dressing to L foot - wear post-op shoe OOB Required Braces or Orthoses: Other Brace Other Brace: dressing on L foot Restrictions Weight Bearing Restrictions: No  Pt received seated in recliner with spouse present for family education. Pt reports baseline HA and agreeable to PT session with emphasis on bed mobility, gait and LE strength training. Pt transported by w/c to dayroom for time management. Pt requires CGA with stand pivot transfer to elevated mat and pt able to perform small hop to sit on mat to simulate bed height. Pt (S) with sit<>supine and able to demonstrate safe techniques. Pt transported by w/c outside [facility] atrium for community integration and to increase mood. Pt required SBA for dynamic standing balance with RW while performing 3 x 10 of resisted red theraband terminal knee extension. Pt transported by w/c and attempted w/c propulsion with hemi technique and unsuccessful. Pt participated in blocked practice of gait training and ambulated total distance of 30 ft with RW and left hand splint and min A for balance. Pt required 3 seated rest breaks with gait. Pt returned to room and left seated in w/c at bedside with alarm on and all needs within reach.    Therapy/Group: Individual Therapy  Verl Dicker Verl Dicker PT, DPT  02/15/2022, 7:54 AM

## 2022-02-15 NOTE — Progress Notes (Signed)
PROGRESS NOTE   Subjective/Complaints:  Pt reports no issues- denies constipation, but doesn't remember when had LBM.  Refuses to wear PRAFO.     ROS:  Limited by cognition    Objective:   No results found.  Recent Labs    02/15/22 0551  WBC 9.2  HGB 9.3*  HCT 28.9*  PLT 435*    Recent Labs    02/15/22 0551  NA 133*  K 4.4  CL 99  CO2 23  GLUCOSE 395*  BUN 39*  CREATININE 2.47*  CALCIUM 8.6*     Intake/Output Summary (Last 24 hours) at 02/15/2022 0806 Last data filed at 02/14/2022 2200 Gross per 24 hour  Intake 1185 ml  Output 2000 ml  Net -815 ml          Physical Exam: Vital Signs Blood pressure (!) 142/75, pulse 85, temperature 98.2 F (36.8 C), temperature source Oral, resp. rate 18, weight 84.5 kg, last menstrual period 11/01/2021, SpO2 99 %.    General: awake, alert, calm; asleep initially- woke but not well to verbal stimuli;  NAD HENT: conjugate gaze; oropharynx moist CV: regular rate; no JVD Pulmonary: CTA B/L; no W/R/R- good air movement GI: soft, NT, ND, (+)BS Psychiatric: appropriate but flat Neurological: alert, but sleepy-  L foot with dressing wrapped in ace wrap- C/D/I Tenting in hands B/L   PE from prior encounter: Skin: + L 5th toe amputation; wound site open with mild serous drainage, clean wound bed, no erythema or warmth.  Seen wrapped up in ACE wrap- C/D/I MSK:      No apparent deformity.      Strength:                RUE: 5/5 SA, 5/5 EF, 5/5 EE, 5/5 WE, 5/5 FF, 5/5 FA                 LUE: flaccid, 0/5 throughout                RLE: 5/5 HF, 5/5 KE, 5/5 DF, 5/5 EHL, 5/5 PF                 LLE:  3/5 HF, 2/5 KE, 0/5 DF, 0/5 EHL, 1/5 PF    Neurologic exam:  Cognition: AAO to person, place, time and event.  Language: Fluent, No substitutions or neoglisms.moderate dysarthria Mood: Pleasant affect, appropriate mood.  Sensation: To light touch intact in BL UEs  and LEs  CN: Left facial droop   Assessment/Plan: 1. Functional deficits which require 3+ hours per day of interdisciplinary therapy in a comprehensive inpatient rehab setting. Physiatrist is providing close team supervision and 24 hour management of active medical problems listed below. Physiatrist and rehab team continue to assess barriers to discharge/monitor patient progress toward functional and medical goals  Care Tool:  Bathing    Body parts bathed by patient: Right arm, Left arm, Chest, Abdomen, Buttocks, Right upper leg, Left upper leg, Face, Front perineal area, Right lower leg   Body parts bathed by helper: Front perineal area, Right lower leg, Left lower leg Body parts n/a: Left lower leg   Bathing assist Assist Level: Moderate Assistance -  Patient 50 - 74%     Upper Body Dressing/Undressing Upper body dressing   What is the patient wearing?: Bra, Pull over shirt    Upper body assist Assist Level: Moderate Assistance - Patient 50 - 74%    Lower Body Dressing/Undressing Lower body dressing      What is the patient wearing?: Incontinence brief, Pants     Lower body assist Assist for lower body dressing: Moderate Assistance - Patient 50 - 74%     Toileting Toileting Toileting Activity did not occur (Clothing management and hygiene only): N/A (no void or bm)  Toileting assist Assist for toileting: Minimal Assistance - Patient > 75%     Transfers Chair/bed transfer  Transfers assist     Chair/bed transfer assist level: Moderate Assistance - Patient 50 - 74%     Locomotion Ambulation   Ambulation assist   Ambulation activity did not occur: Safety/medical concerns  Assist level: Minimal Assistance - Patient > 75% Assistive device: Walker-rolling (left hand splint) Max distance: 55 ft   Walk 10 feet activity   Assist  Walk 10 feet activity did not occur: Safety/medical concerns  Assist level: Minimal Assistance - Patient > 75% Assistive  device: Walker-rolling   Walk 50 feet activity   Assist Walk 50 feet with 2 turns activity did not occur: Safety/medical concerns  Assist level: Minimal Assistance - Patient > 75% Assistive device: Walker-rolling    Walk 150 feet activity   Assist Walk 150 feet activity did not occur: Safety/medical concerns         Walk 10 feet on uneven surface  activity   Assist Walk 10 feet on uneven surfaces activity did not occur: Safety/medical concerns         Wheelchair     Assist Is the patient using a wheelchair?: Yes Type of Wheelchair: Manual    Wheelchair assist level: Total Assistance - Patient < 25% Max wheelchair distance: 300 ft    Wheelchair 50 feet with 2 turns activity    Assist        Assist Level: Total Assistance - Patient < 25%   Wheelchair 150 feet activity     Assist      Assist Level: Total Assistance - Patient < 25%   Blood pressure (!) 142/75, pulse 85, temperature 98.2 F (36.8 C), temperature source Oral, resp. rate 18, weight 84.5 kg, last menstrual period 11/01/2021, SpO2 99 %.  Medical Problem List and Plan: 1. 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             -patient may shower with lines and foot wound covered             -ELOS/Goals: 14-21 days  WBAT On LLE D/c 10/20 Con't CIR_ PT, OT and SLP 2.  Antithrombotics: -DVT/anticoagulation:  Pharmaceutical: Lovenox             -antiplatelet therapy: Brilinta 3. Pain Management: continue Tylenol, oxycodone as needed. Left arm sling ordered.  4. Insomnia: LCSW to evaluate and provide support             Replace tizanidine with ramelteon and magnesium supplement at 7pm when patient likes to sleep             -antipsychotic agents: n/a 5. Neuropsych/cognition: This patient is capable of making decisions on her own behalf. 6.  Skin/Wound Care - Hx  L 5th digit amputation 08/2021 (see below): Routine skin care checks           7. Fluids/Electrolytes/Nutrition: Strict Is and Os and follow-up chemistries             -dys 3/thin liquids; SLP eval             -repeat mag/phos tomorrow 8: Right ICA dissection with stent placement: on Brilinta 9: Hypertension: monitor             -continue hydralazine 12.5 mg TID             -continue isosorbide 20 mg BID             -continue Lopressor 37.5 mg BID  10/2- BP is controlled- con't regimen  10/3- had BP of 80s/50s yesterday- hydralazine stopped- is better this AM- will see how does in therapy with standing- concern will drop  10/4- will decrease Metoprolol to 25 mg BID- not change to succinate, since won't have ability to change meds as often.   10/5- BP overnight 110s/120s supine- will see how tolerates therapy today.   10/6- BP doing better, they increased her B blocker back to 37.5 mg BID   10/13 well controlled, continue to monitor  10/16- BP very slightly elevated, but overall controlled- con't regimen    02/15/2022    5:03 AM 02/15/2022    5:01 AM 02/15/2022    2:32 AM  Vitals with BMI  Weight 186 lbs 5 oz    Systolic  379 024  Diastolic  75 39  Pulse  85 81    10: Hyperlipidemia: continue Lipitor 40 mg daily 11: Coagulopathy s/p RUE embolectomy 10/2021: Eliquis PTA now on Lovenox; f                -follow-up hematology as outpt 12: COPD exacerbation: hypoxia resolved; continue bronchodilators/nebs 13: Chronic diastolic and systolic heart failure:             -strict Is and Os and daily weight             -no diuretics currently             -see meds above   Filed Weights   02/13/22 0514 02/14/22 0502 02/15/22 0503  Weight: 88.2 kg 85.5 kg 84.5 kg   10/5- will start Lasix 10 mg daily due to vascualar congestion on CXR  10/6- Per Cards, needs 40 mg daily- will change  10/7 weight appears stable, monitor  10/14: weight is up 2kg, add additional 20mg   lasix today  10/16- will decrease lasix to 20 mg daily since Cr up significantly to 2.46 this AM- that's the highest it's been- usually/lately 2.1 and BUN is 39- will recheck Wednesday.  14: Hypothyroidism: continue Synthroid 125 mcg daily; follow-up TSH 15: 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   10/3- ate breakfast tray this AM- BG's running from 56 (low yesterday) to 312 yesterday due to  foods form home- will ask DM coordinator to help try and manage.   10/4- CBG's mid 100s to mid 200s- a little better in last 24 hours- no hypoglycemia  10/6- Dm coordinator - increased meals novolog 5 units TID with meals and night/QHS 0-5 SSI  10/7 increase Novolog to 7 units TID  10/8 CBGs elevated last night, a little better today, continue monitor response to insulin adjustment  10/10 increase levemir to 27u  10/11 DM coordinator recommending 10 U novolog TID meal coverage and 25 U levemir BID, will make these changes   10/13 well controlled overall, continue current regimen  10/16- still eating food from home- BG's 168-388 this AM- if stays up, will call DM coordinator back 17: Class 2 obesity: dietary counseling 18: Tobacco abuse: cessation counseling; continue nicotine patch 19: AKI atop CKD IV: trend BMP  -9/29 Cr improved to 1.88  10/2- Down to 1.59 and BUN 24- will push fluids and recheck in AM  10/3- Cr 1.64 and BUN 24- basically stable- will recheck Thursday-   10/4- ordered BMP for tomorrow  10/5- Cr 2.12 up from 1.64- but required Lasix 40 mg PO x1- actually will start lasix 10 mg daily due to pulm/vascular congestion-   10/6- CR usually runs ~ 2 per  Cards note-   -10/10 CR stable at 2.16  10/16- Cr up to 2.46 and BUN 39- will decrease Lasix to 20 mg daily to see if helps; recheck Wednesday 20: CAD: stable; treated medically; Dr. Harl Bowie 21: Left foot osteomyelitis s/p ray amputation [name] -recent bone biopsy; cultures neg - Husband to bring in ?CAM boot for weightbearing, protection - Remove IJ order - 9/30 Noted surgical site dehiscence with PT ambulation, was not wearing orthotic. Podiatry consulted, imaging showing mildly angulated fracture of the distal fourth metatarsal (likely iatrogenic from prior amputation)               - Orders for betadyne dressing daily               - NWB LLE               - Appreciate podiatry recs, to discuss 4th MT fx as group next week  10/2- Podiatry says once has CAM boot to be worn- either from home or for [name] to order- d/w PA-  and will check in to it. Supposedly had a dehiscence but podiatry to see again today.   10/3- podiatry said they'd come back for possible bedside debridement- can WBAT now with CAM boot  10/11 Seen by Podiatry for L foot ulcer,  Iodosorb and gauze dressings daily, WBAT LLE in surgical shoe 22: Leukocytosis: trending down; follow-up CBC  -WBC down to 7/1 on 10/9  Recheck monday 23. Dysphagia  -Advanced to heart healthy/carb modified/ thin liquids            - 9/30 - family requesting diet liberalization as patient primarily eating foods brought in from outside d/t hospital food bland; carb modified diet unable to be ordered with less restrictive parameters. Dietary consulted.   24. RSV  10/2- will take off Droplet precautions- no more Sx's.  25. Chest pain- in setting of severe cardiac issues including CHF with EF 35-40%, CAD- acute on chronic CHF  10/5- CP last night- required NTG x2- EKG had no changes- troponins elevated probably due to pulmonary congestion which was also found- will transfuse 1 unit to help CP; also restart her home Protonix for hx of reflux.  10/10  CP last night, resolved with NTG, troponins not increased above baseline 26, ABLA  10/5- will transfuse 1 unit- think the Hb 7.8 that came back 5 hours later is likely due to hemoconcentration, and isn't real, since her Hb has been trending down for days. On Lovenox and Brillenta- check Fecal occult for blood loss.   HGB stable at 8.8 on 10/9   Recheck Monday 27. Insomnia  10/5- will increase trazodone to 75 mg QHS per pt request  10/6- slepeing better per pt.   10/12 Increase trazodone to 100mg  QHS  -Improved continue to monitor  I spent a total of 38   minutes on total care today- >50% coordination of care- due to prolonged time reviewing chart and d/w nursing about BG's and kidney function.    LOS: 18 days A FACE TO FACE EVALUATION WAS PERFORMED  Aeson Sawyers 02/15/2022, 8:06 AM

## 2022-02-15 NOTE — Progress Notes (Signed)
Speech Language Pathology Weekly Progress and Session Note  Patient Details  Name: Lauren [name] MRN: [id number] Date of Birth: 12-16-1980  Beginning of progress report period: February 08, 2022 End of progress report period: February 19, 2022  Today's Date: 02/15/2022 SLP Individual Time: 0900-1000 SLP Individual Time Calculation (min): 60 min  Short Term Goals: Week 2: SLP Short Term Goal 1 (Week 2): Pt will utilize visual scanning strategies to complete tasks targeting L inattention with 100% completion given Mod A verbal and visual cues. SLP Short Term Goal 1 - Progress (Week 2): Met SLP Short Term Goal 2 (Week 2): Pt will complete basic visual and verbal problem-solving tasks r/t iADL and ADL completion given supervision verbal cues. SLP Short Term Goal 2 - Progress (Week 2): Not met SLP Short Term Goal 3 (Week 2): Patient will self-monitor and correct errors during functional tasks with Mod A verbal cues. SLP Short Term Goal 3 - Progress (Week 2): Not met SLP Short Term Goal 4 (Week 2): Patient will demonstrate sustained attention to functional tasks for 30 minutes with Mod verbal cues for redirection. SLP Short Term Goal 4 - Progress (Week 2): Met    New Short Term Goals: Week 3: SLP Short Term Goal 1 (Week 3): STG=LTG due to ELOS 10/20  Weekly Progress Updates:pt made moderate progress meeting 2 out 4 goals, requiring mod-min A. Pt has demonstrates improvements in left scanning requiring an average of mod A verbal/visual cues, pt has fluctuating attention from sustained to selective with mod A verbal cues requried in 30 minute intervals, daily recall, pt is better able to complex basic problem solving task with cues for left scanning and starting mildly complex problem solving task and demonstrates appropriate orientation. Pt's barriers at d/c are impaired awareness of impact of deficits, left neglect, reduced short term/working memory, reduced mildly complex problem solving and  reduced attention. Education is on going. Pt would continue to benefit from skilled ST services in order to maximize functional independence and reduce burden of care, requiring 24 hour supervision at discharge with continued skilled ST services.      Intensity: Minumum of 1-2 x/day, 30 to 90 minutes Frequency: 3 to 5 out of 7 days Duration/Length of Stay: 02/19/22 Treatment/Interventions: Cognitive remediation/compensation;Environmental controls;Functional tasks;Internal/external aids;Patient/family education;Dysphagia/aspiration precaution training;Therapeutic Activities;Cueing hierarchy;Speech/Language facilitation   Daily Session  Skilled Therapeutic Interventions: Skilled ST services focused on education and cognition skills. Pt's husband present for education. SLP facilitated assessment of left neglect and prosody in [place] right hemisphere assessment, pt score a mild deficit in left neglect in table top tasks, mild deficits in expressive prosody and WFL in receptive prosody. Pt's husband supports very mild changes in expressive prosody with social norms. Pt required max-mod A verbal cues for error awareness in organization and due to left neglect in mildly complex account balancing task. All questions answered to satisfaction. Pt was left in room with husband, call bell within reach and chair alarm set. SLP recommends to continue skilled services.    General    Pain Pain Assessment Pain Scale: 0-10 Pain Score: 6  Faces Pain Scale: Hurts a little bit Pain Type: Acute pain Pain Location: Head Pain Descriptors / Indicators: Aching Pain Intervention(s): Medication (See eMAR)  Therapy/Group: Individual Therapy  Shaterra Sanzone  [facility] 02/15/2022, 3:08 PM

## 2022-02-15 NOTE — Progress Notes (Signed)
Occupational Therapy Session Note  Patient Details  Name: Lauren [name] MRN: [id number] Date of Birth: 1980/10/29  Today's Date: 02/15/2022 OT Individual Time: 1300-1400 OT Individual Time Calculation (min): 60 min    Short Term Goals: Week 3:  OT Short Term Goal 1 (Week 3): Pt will stand sinkside for all grooming activity with close s for up to 4- 5 minutes OT Short Term Goal 2 (Week 3): Pt will use RW to access toilet and shower with min A and mod cues for safety, awareness and L sided scanning using L hand walker splint OT Short Term Goal 3 (Week 3): Pt will demonstrate UB dressing with set up only incl pull over bra OT Short Term Goal 4 (Week 3): Pt will perform transfer to shower mirroring home set up with close S and mod cues  Skilled Therapeutic Interventions/Progress Updates:  Pt seen for skilled OT session with pt having completed 100% of lunch meal prior to OT arrival. Pt transported w/c level to demo apartment and tub room for training for shower transpers to simulate home for d/c prep. Then taken to day room for L UE NMRE with OT facilitation of distal L hand over hand (fingers feathered) for large peg dowel grasp/release and placement with some initiation of scap,sh, elbow and hand AAROM. After session, pt required use of toilet and status as follows: min A UB self care, mod-min A LB self care, grooming with CGA and cues, min a toileting, min A walk in shower tub bench and toilet transfers, min A with mod cues for stall shower with threshold tecnique with TTB. Family Educ scheduled for Th 1-4. Left pt w/c level with chair alarm active, call button in place and needs in reach.   Therapy Documentation Precautions:  Precautions Precautions: Fall Precaution Comments: L inattention, R gaze preference, L hemiplegia, no dressing to L foot - wear post-op shoe OOB Required Braces or Orthoses: Other Brace Other Brace: dressing on L foot Restrictions Weight Bearing Restrictions:  No    Therapy/Group: Individual Therapy  [name] 02/15/2022, 7:48 AM

## 2022-02-15 NOTE — Progress Notes (Signed)
Nutrition Follow-up  DOCUMENTATION CODES:   Obesity unspecified  INTERVENTION:  - No interventions needed at this time.   NUTRITION DIAGNOSIS:   Other (Comment) (undesirable food choices) related to chronic illness (DM, CHF, HTN, CVA) as evidenced by other (comment) (pt requesting >80g CHO/meal).   GOAL:   Patient will meet greater than or equal to 90% of their needs   MONITOR:   PO intake, Labs, I & O's, Skin  REASON FOR ASSESSMENT:   Consult Other (Comment) (diet liberalization)  ASSESSMENT:   Pt is a 41yo F with PMH of CAD with MI, CHF with preserved EF, HTN, uncontrolled DM, strokes, R arm ischemia, CKD stage IV, PVD, L foot DM ulcer s/p amputation of 5th toe 08/2021 with concern of osteomyelitis of 4th toe admitted with acute R MCA stroke s/p thrombectomy, R ICA dissection repair, and stent placement who presents for rehab after acute R MCA stroke.  Pt was working with physical therapy at time of assessment. The RN reports that the pt ate 100% of her breakfast this am and has been eating well.    Diet Order:   Diet Order             Diet Carb Modified Fluid consistency: Thin; Room service appropriate? Yes with Assist  Diet effective now                   EDUCATION NEEDS:   No education needs have been identified at this time  Skin:  Skin Assessment: Reviewed RN Assessment  Last BM:  02/15/22  Height:   Ht Readings from Last 1 Encounters:  01/13/22 5\' 5"  (1.651 m)    Weight:   Wt Readings from Last 1 Encounters:  02/15/22 84.5 kg    Ideal Body Weight:  56.8 kg  BMI:  Body mass index is 31 kg/m.  Estimated Nutritional Needs:   Kcal:  1700-2100kcal  Protein:  70-85g  Fluid:  1700-2170mL  Brekyn Huntoon Graciela Husbands, RD, LDN, CNSC

## 2022-02-16 LAB — GLUCOSE, CAPILLARY
Glucose-Capillary: 138 mg/dL — ABNORMAL HIGH (ref 70–99)
Glucose-Capillary: 169 mg/dL — ABNORMAL HIGH (ref 70–99)
Glucose-Capillary: 414 mg/dL — ABNORMAL HIGH (ref 70–99)
Glucose-Capillary: 238 mg/dL — ABNORMAL HIGH (ref 70–99)

## 2022-02-16 NOTE — Progress Notes (Signed)
Patient ID: Lauren [name], female   DOB: May 18, 1980, 41 y.o.   MRN: [id number]  Met with pt and husband via telephone to update regarding team conference progress toward goals this week and education Thursday with husband and daughter in preparation for discharge Friday. Have ordered the equipment awaiting for approval via insurance. Home health arranged via [place]. Work toward discharge Friday.

## 2022-02-16 NOTE — Progress Notes (Signed)
Speech Language Pathology Daily Session Note  Patient Details  Name: Lauren [name] MRN: [id number] Date of Birth: 04/08/1981  Today's Date: 02/16/2022 SLP Individual Time: 0727-0810 SLP Individual Time Calculation (min): 43 min  Short Term Goals: Week 3: SLP Short Term Goal 1 (Week 3): STG=LTG due to ELOS 10/20  Skilled Therapeutic Interventions: Skilled treatment session focused on cognitive and dysphagia goals. Upon arrival, patient was asleep in bed but easily roused. Patient requested to get into the wheelchair but declined donning pants and opted to stay in the hospital gown until her shower. With supervision question cues patient reported she needed to donn her surgical shoe on her LLE prior to transfer. Patient transferred to the wheelchair to maximize position and safety with PO intake. Patient participated in tray set-up with Min verbal cues to locate items on tray in left visual field. Patient consumed her breakfast of regular textures with thin liquids without overt s/s of aspiration and was overall Mod I for use of swallowing compensatory strategies. Recommend patient continue current diet. Patient's husband called the room, patient talked briefly and then hung up the phone. Approximately 10 minutes later, patient picked up the room phone in attempts to ask her husband a question as she thought he was still on the line. Patient easily redirected. Patient left upright in wheelchair with alarm on and all needs within reach. Continue with current plan of care.      Pain Pain Assessment Pain Scale: 0-10 Pain Score: 0-No pain  Therapy/Group: Individual Therapy  Tadan Shill 02/16/2022, 10:07 AM

## 2022-02-16 NOTE — Progress Notes (Signed)
Occupational Therapy Session Note  Patient Details  Name: Lauren [name] MRN: [id number] Date of Birth: 03/07/1981  Today's Date: 02/16/2022 OT Individual Time: 0900-1000 1st Session, 1445-1530 2nd session  OT Individual Time Calculation (min): 60 min ,45 min    Short Term Goals: Week 3:  OT Short Term Goal 1 (Week 3): Pt will stand sinkside for all grooming activity with close s for up to 4- 5 minutes OT Short Term Goal 2 (Week 3): Pt will use RW to access toilet and shower with min A and mod cues for safety, awareness and L sided scanning using L hand walker splint OT Short Term Goal 3 (Week 3): Pt will demonstrate UB dressing with set up only incl pull over bra OT Short Term Goal 4 (Week 3): Pt will perform transfer to shower mirroring home set up with close S and mod cues  Skilled Therapeutic Interventions/Progress Updates:  1st Session:  Pt seen for skilled OT session with focus on AM self care with toileting, full shower bathing, dressing and grooming and safe transfer carryover with L sided facilitation and awareness training. Pt reported she did have a few BM's last evening following an enema and reported she requested a shower and was frustrated she had to wait for our session as per the last pm nursing staff. OT assisted with all w/c management from bedside to toilet to shower to sink side due to hemi sided weakness and decreased safety for wheel lock management on L side. Pt able to verbalize all steps of transfers and toileting sequence with min A toilet and shower bench transfer, close s for toilet hygiene, OT assisted covering IV and L foot wound sites, pt bathed seated level with min A and stood with CGA for OT assist in rinsing peri and buttocks area while pt held to grab bar with R UE. Pt less impulsive overall with increased attention to L side however continues to require slow and step by step directions to avoid pt skipping steps of transfers or tasks and risking falls. Once  seated w/c level sink side, pt able to complete grooming with set up and cues for one handed techniques with increased time, pull over bra and shirt with CGA and mod cues, LB dressing with mod-min A except incontinence brief due to limited balance and only unilateral UE support and use. Pt employs good one leg cross over techniques B sides. Pt able to dry hair seated and OT assisted to put hair up as per pt request. Left pt in the care of NT for vitals and BGL.   2nd Session:   Pt up in w/c upon OT arrival. Focus of session on NMRE and developmental positions in therapy gym as well as L sided awareness, attention, and transfer safety. OT transported pt to day room gym. Pt transferred on and off therapy mat with CGA and min cues to ensure safe steps of transfer and L LE footing secure in ortho boot. Moved from sitting to sidelying then to prone over wedge with OT facilitation to manage L UE into safe and secure prone on elbows with OT support to ensure L shoulder approximation of the GHJ. Pt reported releif in these positions as pt has difficulty breathing at times on her back and has not been sidelying nor prone on a firm surface since admission. Pt moved with min A to sitting and worked with OT threading L hand over pt's L hand to grasp suction squiggs off mirror at waist to chest  level with max facilitation however some initiation within all joints given guiding, tapping, quick stretch and PNF diagonals. Pt completed session with L UE weightbearing on wedge and reaching to L planes with R UE for playing card scanning and matching with increased time to far periphery with min cues for wide head turn with 90% accuracy. Once back in room, chair alarm set, needs and nurse call button placed in reach and no pain reported.   Therapy Documentation Precautions:  Precautions Precautions: Fall Precaution Comments: L inattention, R gaze preference, L hemiplegia, no dressing to L foot - wear post-op shoe  OOB Required Braces or Orthoses: Other Brace Other Brace: dressing on L foot Restrictions Weight Bearing Restrictions: No    Therapy/Group: Individual Therapy  [name] 02/16/2022, 7:30 AM

## 2022-02-16 NOTE — Progress Notes (Signed)
Physical Therapy Session Note  Patient Details  Name: Jadalynn Burr MRN: [id number] Date of Birth: 06/24/1980  Today's Date: 02/16/2022 PT Individual Time: 5643-3295 PT Individual Time Calculation (min): 59 min   Short Term Goals: Week 3:  PT Short Term Goal 1 (Week 3): STG=LTG due to ELOS  Skilled Therapeutic Interventions/Progress Updates:      Therapy Documentation Precautions:  Precautions Precautions: Fall Precaution Comments: L inattention, R gaze preference, L hemiplegia, no dressing to L foot - wear post-op shoe OOB Required Braces or Orthoses: Other Brace Other Brace: dressing on L foot Restrictions Weight Bearing Restrictions: No  Pt received seated in w/c at bedside agreeable to PT session. Pt declines pain and is emotional throughout session as she is having a difficult time being away from her children. Therapist provided emotional support and recommended pt call her children later today and pt agreeable. PT dependently donned ted hose and ace wrap for edema management and pt transported by w/c to main gym for time management. Pt requires CGA for stand pivot transfer to mat and SBA for dynamic standing balance with L UE supported on RW while patient aimed horse shoe for target. Pt requested to play checkers and able to stand for dynamic contralateral and ipsilateral reaching with SBA. Pt requested need to toilet and transported to room. Pt CGA with stand pivot to toilet and pt with +BM. Pt performed peri-care with (S) in sitting and transferred back to w/c. Pt left seated in w/c at bedside with chair alarm on and all needs within reach.   Therapy/Group: Individual Therapy  Verl Dicker Verl Dicker PT, DPT  02/16/2022, 7:55 AM

## 2022-02-16 NOTE — Patient Care Conference (Signed)
Inpatient RehabilitationTeam Conference and Plan of Care Update Date: 02/16/2022   Time: 11:30 AM    Patient Name: Lauren [name]      Medical Record Number: [id number]  Date of Birth: 26-Sep-1980 Sex: Female         Room/Bed: 4W05C/4W05C-01 Payor Info: Payor: [name] / Plan: [place] [employer] / Product Type: *No Product type* /    Admit Date/Time:  01/28/2022  2:40 PM  Primary Diagnosis:  Acute right MCA stroke 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 Courtney Heys Social Worker Present: Ovidio Kin, LCSW Nurse Present: Other (comment) Tacy Learn, RN) PT Present: Other (comment) OT Present: Jamey Ripa, OT Verl Dicker, PT) SLP Present: Helaine Chess, SLP PPS Coordinator present : Gunnar Fusi, SLP     Current Status/Progress Goal Weekly Team Focus  Bowel/Bladder   Cont of b&b, with incont episodes. LBM 02/15/2022  Full cont of b&b  Assess qshift and prn.   Swallow/Nutrition/ Hydration   Regular textures with thin liquids, intermittent supervision-Mod I  Mod I  Family education   ADL's   min A UB self care, mod-min A LB self care, grooming wiht CGA and cues, min a toileting, min A walk in shower tub bench and toilet transfers, min A wiht mod cues for stall shower ith threshold tecnique with TTB  min A  Family Educ, home set up training for bathing in tub shower and/or threshold stall shower with TTB, safe transfer  techniques, maximize hemi techniques   Mobility   (S) bed mobility, CGA transfers, gait x 30 ft min A with RW/ left hand splint  CGA  transfers, gait, pt/fam education   Communication             Safety/Cognition/ Behavioral Observations  Min-Mod A  Min-Mod A  Family education   Pain   4 out of 10  Remain free of pain  Assess qshift and prn.   Skin   Wound left 5th toe.  Remain free of infection.  Assess qshift and prn.     Discharge Planning:  Husband and daughter to be here Thursday for education in prepartion for home on Friday. Have ordered equipment awaiting prior approval. Home health set up   Team Discussion: Right MCA/CVA. Continent B/B. Generalized pain. Left toe amputation 07/23 still requires wound care. Non compliant with diet (DM) Lasix decreased. Monitor for additional swelling and SOB. Patient self limiting and has no interest in therapy or changing habits. Getting ramp in for discharge to home with husband and daughter. Patient on target to meet rehab goals: yes, ambulating 37ft min assist with RW  *See Care Plan and progress notes for long and short-term goals.   Revisions to Treatment Plan:  Medication adjustments, monitor labs  Teaching Needs: Medications, safety, skin/wound care, gait/transfer training, etc.  Current Barriers to Discharge: Decreased caregiver support, Home enviroment access/layout, Wound care, Weight, and Behavior  Possible Resolutions to Barriers: Family education, skin/wound education, order recommended DME  Medical Summary Current Status: continent B/B- except for after enema last night- pain generalized- L toe amputation- 7/23- stil doing wound care dressing-MASD for buttocks  Barriers to Discharge: Behavior;Decreased family/caregiver support;Home enviroment access/layout;Medical stability;New diabetic;Wound care;Weight bearing restrictions  Barriers to Discharge Comments: family ed tomorrow with daughter/husband Possible  Resolutions to [employer] Focus: CGA transfers- up to 30 ft min A RW- goals CGA- min A UB, min-mod A LB- limitaiton- lack of initiation/interest in therapy- self limiting; pain and foot brace/ SLP at goal level - mod I diet; min-mod A for cognition- getting ramp at home- d/c- 10/20   Continued Need for Acute Rehabilitation Level of Care: The patient requires daily medical management by a physician with specialized training in physical medicine and rehabilitation for the following reasons: Direction of a multidisciplinary physical rehabilitation program to maximize functional independence : Yes Medical management of patient stability for increased activity during participation in an intensive rehabilitation regime.: Yes Analysis of laboratory values and/or radiology reports with any subsequent need for medication adjustment and/or medical intervention. : Yes   I attest that I was present, lead the team conference, and concur with the assessment and plan of the team.   Ernest Pine 02/16/2022, 4:23 PM

## 2022-02-16 NOTE — Progress Notes (Signed)
PROGRESS NOTE   Subjective/Complaints:  Pt reports LBM yesterday/overnight- required enema- was impacted- had complete blow out- but was told could have shower today-  Feels like "everything is out".   ROS:   Pt denies SOB, abd pain, CP, N/V/C/D, and vision changes   Objective:   No results found.  Recent Labs    02/15/22 0551  WBC 9.2  HGB 9.3*  HCT 28.9*  PLT 435*   Recent Labs    02/15/22 0551  NA 133*  K 4.4  CL 99  CO2 23  GLUCOSE 395*  BUN 39*  CREATININE 2.47*  CALCIUM 8.6*    Intake/Output Summary (Last 24 hours) at 02/16/2022 0811 Last data filed at 02/15/2022 1430 Gross per 24 hour  Intake 480 ml  Output --  Net 480 ml         Physical Exam: Vital Signs Blood pressure (!) 113/58, pulse 79, temperature 97.8 F (36.6 C), temperature source Oral, resp. rate 18, weight 84.5 kg, last menstrual period 11/01/2021, SpO2 100 %.     General: awake, alert, appropriate, sat up EOB with Slp assistance; SLP at bedside;  NAD HENT:  oropharynx moist CV: regular rate; no JVD Pulmonary: CTA B/L; no W/R/R- good air movement GI: soft, NT, ND, (+)BS- slightly hypoactive Psychiatric: appropriate- but still flat,- more interactive Neurological: more awake, alert today L foot with dressing wrapped in ace wrap- C/D/I Tenting in hands B/L   PE from prior encounter: Skin: + L 5th toe amputation; wound site open with mild serous drainage, clean wound bed, no erythema or warmth.  Seen wrapped up in ACE wrap- C/D/I MSK:      No apparent deformity.      Strength:                RUE: 5/5 SA, 5/5 EF, 5/5 EE, 5/5 WE, 5/5 FF, 5/5 FA                 LUE: flaccid, 0/5 throughout                RLE: 5/5 HF, 5/5 KE, 5/5 DF, 5/5 EHL, 5/5 PF                 LLE:  3/5 HF, 2/5 KE, 0/5 DF, 0/5 EHL, 1/5 PF    Neurologic exam:  Cognition: AAO to person, place, time and event.  Language: Fluent, No substitutions or  neoglisms.moderate dysarthria Mood: Pleasant affect, appropriate mood.  Sensation: To light touch intact in BL UEs and LEs  CN: Left facial droop   Assessment/Plan: 1. Functional deficits which require 3+ hours per day of interdisciplinary therapy in a comprehensive inpatient rehab setting. Physiatrist is providing close team supervision and 24 hour management of active medical problems listed below. Physiatrist and rehab team continue to assess barriers to discharge/monitor patient progress toward functional and medical goals  Care Tool:  Bathing    Body parts bathed by patient: Right arm, Left arm, Chest, Abdomen, Buttocks, Right upper leg, Left upper leg, Face, Front perineal area, Right lower leg   Body parts bathed by helper: Front perineal area, Right lower leg, Left lower leg Body  parts n/a: Left lower leg   Bathing assist Assist Level: Moderate Assistance - Patient 50 - 74%     Upper Body Dressing/Undressing Upper body dressing   What is the patient wearing?: Bra, Pull over shirt    Upper body assist Assist Level: Moderate Assistance - Patient 50 - 74%    Lower Body Dressing/Undressing Lower body dressing      What is the patient wearing?: Incontinence brief, Pants     Lower body assist Assist for lower body dressing: Moderate Assistance - Patient 50 - 74%     Toileting Toileting Toileting Activity did not occur (Clothing management and hygiene only): N/A (no void or bm)  Toileting assist Assist for toileting: Minimal Assistance - Patient > 75%     Transfers Chair/bed transfer  Transfers assist     Chair/bed transfer assist level: Moderate Assistance - Patient 50 - 74%     Locomotion Ambulation   Ambulation assist   Ambulation activity did not occur: Safety/medical concerns  Assist level: Minimal Assistance - Patient > 75% Assistive device: Walker-rolling (left hand splint) Max distance: 55 ft   Walk 10 feet activity   Assist  Walk 10 feet  activity did not occur: Safety/medical concerns  Assist level: Minimal Assistance - Patient > 75% Assistive device: Walker-rolling   Walk 50 feet activity   Assist Walk 50 feet with 2 turns activity did not occur: Safety/medical concerns  Assist level: Minimal Assistance - Patient > 75% Assistive device: Walker-rolling    Walk 150 feet activity   Assist Walk 150 feet activity did not occur: Safety/medical concerns         Walk 10 feet on uneven surface  activity   Assist Walk 10 feet on uneven surfaces activity did not occur: Safety/medical concerns         Wheelchair     Assist Is the patient using a wheelchair?: Yes Type of Wheelchair: Manual    Wheelchair assist level: Total Assistance - Patient < 25% Max wheelchair distance: 300 ft    Wheelchair 50 feet with 2 turns activity    Assist        Assist Level: Total Assistance - Patient < 25%   Wheelchair 150 feet activity     Assist      Assist Level: Total Assistance - Patient < 25%   Blood pressure (!) 113/58, pulse 79, temperature 97.8 F (36.6 C), temperature source Oral, resp. rate 18, weight 84.5 kg, last menstrual period 11/01/2021, SpO2 100 %.  Medical Problem List and Plan: 1. 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             -patient may shower with lines and foot wound covered             -ELOS/Goals: 14-21 days  WBAT On LLE D/c 10/20 Con't CIR- PT, OT and SLP Team conference today to finalize d/c/f/u on progress 2.  Antithrombotics: -DVT/anticoagulation:  Pharmaceutical: Lovenox             -antiplatelet therapy: Brilinta 3. Pain Management: continue Tylenol, oxycodone as needed. Left arm sling ordered.  4. Insomnia: LCSW to evaluate and provide support             Replace tizanidine with ramelteon and magnesium supplement at  7pm when patient likes to sleep             -  antipsychotic agents: n/a 5. Neuropsych/cognition: This patient is capable of making decisions on her own behalf. 6. 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 Fluids/Electrolytes/Nutrition: Strict Is and Os and follow-up chemistries             -dys 3/thin liquids; SLP eval             -repeat mag/phos tomorrow 8: Right ICA dissection with stent placement: on Brilinta 9: Hypertension: monitor             -continue hydralazine 12.5 mg TID             -continue isosorbide 20 mg BID             -continue Lopressor 37.5 mg BID  10/2- BP is controlled- con't regimen  10/3- had BP of 80s/50s yesterday- hydralazine stopped- is better this AM- will see how does in therapy with standing- concern will drop  10/4- will decrease Metoprolol to 25 mg BID- not change to succinate, since won't have ability to change meds as often.   10/5- BP overnight 110s/120s supine- will see how tolerates therapy today.   10/6- BP doing better, they increased her B blocker back to 37.5 mg BID   10/13 well controlled, continue to monitor  10/16- BP very slightly elevated, but overall controlled- con't regimen  10/17- BP doing great this Am- con't regimen    02/16/2022    3:05 AM 02/15/2022    7:40 PM 02/15/2022    2:15 PM  Vitals with BMI  Systolic [phone]  Diastolic 58 67 77  Pulse 79 93 80    10: Hyperlipidemia: continue Lipitor 40 mg daily 11: Coagulopathy s/p RUE embolectomy 10/2021: Eliquis PTA now on Lovenox; f                -follow-up hematology as outpt 12: COPD exacerbation: hypoxia resolved; continue bronchodilators/nebs 13: Chronic diastolic and systolic heart failure:             -strict Is and Os and daily weight             -no diuretics currently             -see meds above   Filed Weights   02/13/22 0514 02/14/22 0502 02/15/22 0503  Weight: 88.2 kg 85.5 kg 84.5 kg   10/5- will  start Lasix 10 mg daily due to vascualar congestion on CXR  10/6- Per Cards, needs 40 mg daily- will change  10/7 weight appears stable, monitor  10/14: weight is up 2kg, add additional 20mg  lasix today  10/16- will decrease lasix to 20 mg daily since Cr up significantly to 2.46 this AM- that's the highest it's been- usually/lately 2.1 and BUN is 39- will recheck Wednesday.  14: Hypothyroidism: continue Synthroid 125 mcg daily; follow-up TSH 15: Acute on chonic anemia: s/p 1 un PCs; follow-up CBC  -9/29 HGB stable at 8.2  10/2- Hb down to 7.3- will recheck in AM and make sure improving/stable  10/10 HGB stable at 8.8 16: DM: CBGs q AC and q HS (A1c = 8%)             -9/29 Continue levemir 25u BID  -Add 2u novolog TID with meals             -continue SSI q AC  10/2- pt having variable BG's eating mainly  food from home.   10/3- ate breakfast tray this AM- BG's running from 56 (low yesterday) to 312 yesterday due to foods form home- will ask DM coordinator to help try and manage.   10/4- CBG's mid 100s to mid 200s- a little better in last 24 hours- no hypoglycemia  10/6- Dm coordinator - increased meals novolog 5 units TID with meals and night/QHS 0-5 SSI  10/7 increase Novolog to 7 units TID  10/8 CBGs elevated last night, a little better today, continue monitor response to insulin adjustment  10/10 increase levemir to 27u  10/11 DM coordinator recommending 10 U novolog TID meal coverage and 25 U levemir BID, will make these changes   10/13 well controlled overall, continue current regimen  10/16- still eating food from home- BG's 168-388 this AM- if stays up, will call DM coordinator back  10/17- BG's much better today- 138-228- con't regimen-  17: Class 2 obesity: dietary counseling 18: Tobacco abuse: cessation counseling; continue nicotine patch 19: AKI atop CKD IV: trend BMP  -9/29 Cr improved to 1.88  10/2- Down to 1.59 and BUN 24- will push fluids and recheck in AM  10/3- Cr 1.64  and BUN 24- basically stable- will recheck Thursday-   10/4- ordered BMP for tomorrow  10/5- Cr 2.12 up from 1.64- but required Lasix 40 mg PO x1- actually will start lasix 10 mg daily due to pulm/vascular congestion-   10/6- CR usually runs ~ 2 per Cards note-   -10/10 CR stable at 2.16  10/16- Cr up to 2.46 and BUN 39- will decrease Lasix to 20 mg daily to see if helps; recheck Wednesday 20: CAD: stable; treated medically; Dr. Harl Bowie 21: Left foot osteomyelitis s/p ray amputation [name] -recent bone biopsy; cultures neg - Husband to bring in ?CAM boot for weightbearing, protection - Remove IJ order - 9/30 Noted surgical site dehiscence with PT ambulation, was not wearing orthotic. Podiatry consulted, imaging showing mildly angulated fracture of the distal fourth metatarsal (likely iatrogenic from prior amputation)               - Orders for betadyne dressing daily               - NWB LLE               - Appreciate podiatry recs, to discuss 4th MT fx as group next week  10/2- Podiatry says once has CAM boot to be worn- either from home or for [name] to order- d/w PA-  and will check in to it. Supposedly had a dehiscence but podiatry to see again today.   10/3- podiatry said they'd come back for possible bedside debridement- can WBAT now with CAM boot  10/11 Seen by Podiatry for L foot ulcer,  Iodosorb and gauze dressings daily, WBAT LLE in surgical shoe 22: Leukocytosis: trending down; follow-up CBC  -WBC down to 7/1 on 10/9  Recheck monday 23. Dysphagia  -Advanced to heart healthy/carb modified/ thin liquids            - 9/30 - family requesting diet liberalization as patient primarily eating foods brought in from outside d/t hospital food bland; carb modified diet unable to be ordered with less restrictive parameters. Dietary consulted.   24. RSV  10/2- will take off Droplet precautions- no more Sx's.  25. Chest pain- in setting of severe cardiac issues including CHF with EF 35-40%,  CAD- acute on chronic CHF  10/5- CP last night- required  NTG x2- EKG had no changes- troponins elevated probably due to pulmonary congestion which was also found- will transfuse 1 unit to help CP; also restart her home Protonix for hx of reflux.  10/10 CP last night, resolved with NTG, troponins not increased above baseline 26, ABLA  10/5- will transfuse 1 unit- think the Hb 7.8 that came back 5 hours later is likely due to hemoconcentration, and isn't real, since her Hb has been trending down for days. On Lovenox and Brillenta- check Fecal occult for blood loss.   HGB stable at 8.8 on 10/9   Recheck Monday 27. Insomnia  10/5- will increase trazodone to 75 mg QHS per pt request  10/6- slepeing better per pt.   10/12 Increase trazodone to 100mg  QHS  -Improved continue to monitor 28. Constipation  10/17- had enema last night and resolved for now- having bowel /constipation frequently since Stroke- will monitor closely.   I spent a total of 39   minutes on total care today- >50% coordination of care- due to team conference to f/u on progress/finalize d/c plans; and d/w pt and SLP about shower and bowels   LOS: 19 days A FACE TO FACE EVALUATION WAS PERFORMED  Kieren Ricci 02/16/2022, 8:11 AM

## 2022-02-17 LAB — BASIC METABOLIC PANEL
Anion gap: 11 (ref 5–15)
BUN: 38 mg/dL — ABNORMAL HIGH (ref 6–20)
CO2: 25 mmol/L (ref 22–32)
Calcium: 9 mg/dL (ref 8.9–10.3)
Chloride: 103 mmol/L (ref 98–111)
Creatinine, Ser: 2.53 mg/dL — ABNORMAL HIGH (ref 0.44–1.00)
GFR, Estimated: 24 mL/min — ABNORMAL LOW (ref >60)
Glucose, Bld: 88 mg/dL (ref 70–99)
Potassium: 3.7 mmol/L (ref 3.5–5.1)
Sodium: 139 mmol/L (ref 135–145)

## 2022-02-17 LAB — GLUCOSE, CAPILLARY
Glucose-Capillary: 79 mg/dL (ref 70–99)
Glucose-Capillary: 352 mg/dL — ABNORMAL HIGH (ref 70–99)
Glucose-Capillary: 276 mg/dL — ABNORMAL HIGH (ref 70–99)
Glucose-Capillary: 218 mg/dL — ABNORMAL HIGH (ref 70–99)

## 2022-02-17 LAB — SODIUM, URINE, RANDOM: Sodium, Ur: 86 mmol/L

## 2022-02-17 LAB — CREATININE, URINE, RANDOM: Creatinine, Urine: 52 mg/dL

## 2022-02-17 MED ORDER — INSULIN ASPART 100 UNIT/ML IJ SOLN: 12.0000 [IU] | Freq: Three times a day (TID) | INTRAMUSCULAR | Status: DC

## 2022-02-17 MED ORDER — TRAZODONE HCL 50 MG PO TABS
50.0000 mg | ORAL_TABLET | Freq: Every day | ORAL | Status: DC
  Administered 2022-02-17 – 2022-02-18 (×2): 50 mg via ORAL
  Filled 2022-02-17 (×2): qty 1

## 2022-02-17 NOTE — Progress Notes (Signed)
Physical Therapy Session Note  Patient Details  Name: Celestina Gironda MRN: [id number] Date of Birth: 1980-07-18  Today's Date: 02/17/2022 PT Individual Time: 1022-1139 PT Individual Time Calculation (min): 77 min   Short Term Goals: Week 1:  PT Short Term Goal 1 (Week 1): Pt will perform bed mobility with increased attn to LUE and consistent MinA. PT Short Term Goal 1 - Progress (Week 1): Met PT Short Term Goal 2 (Week 1): pt will perform sit<>stand with CGA and improved midline positioning. PT Short Term Goal 2 - Progress (Week 1): Met PT Short Term Goal 3 (Week 1): Pt will perform stand pivot transfers with consistent minA. PT Short Term Goal 3 - Progress (Week 1): Met PT Short Term Goal 4 (Week 1): Pt will initiate gait training using LRAD. PT Short Term Goal 4 - Progress (Week 1): Discontinued (comment) (not appropriate) PT Short Term Goal 5 (Week 1): Pt will initiate stair training. PT Short Term Goal 5 - Progress (Week 1): Discontinued (comment) (not appropriate) Week 2:  PT Short Term Goal 1 (Week 2): Pt will ambulate 30 ft min A PT Short Term Goal 1 - Progress (Week 2): Met PT Short Term Goal 2 (Week 2): Pt will perform bed to chair transfer with CGA or less PT Short Term Goal 2 - Progress (Week 2): Met PT Short Term Goal 3 (Week 2): Pt will require min A for dynamic standing PT Short Term Goal 3 - Progress (Week 2): Met Week 3:  PT Short Term Goal 1 (Week 3): STG=LTG due to ELOS  Skilled Therapeutic Interventions/Progress Updates:      Therapy Documentation Precautions:  Precautions Precautions: Fall Precaution Comments: L inattention, R gaze preference, L hemiplegia, no dressing to L foot - wear post-op shoe OOB Required Braces or Orthoses: Other Brace Other Brace: dressing on L foot Restrictions Weight Bearing Restrictions: No  Pt received seated in w/c at bedside agreeable to PT session with emphasis on car and transfer training along with dynamic standing  balance. Pt reports 4/10 L hand pain and nursing notified. Pt requests to void and CGA with stand pivot transfer to toilet and performed peri-care with (S) seated edge of bed. Pt transported by w/c to ortho gym for time management and energy conservation. Pt performed car transfer x 2 with CGA and verbal cues for sequencing. Pt transported to dayroom and worked on standing dynamic balance and requires SBA with bilateral UE support on table. Pt able to tolerate standing up to 2 minutes at a time and requires intermittent seated rest breaks. Pt transitioned to gait training 5 + 5 + 79f with seated rest breaks in between each bout due to right knee buckling/instability. Pt transported to room and left seated in w/c at bedside and OT present for handoff.    Therapy/Group: Individual Therapy  SVerl DickerSVerl DickerPT, DPT  02/17/2022, 7:47 AM

## 2022-02-17 NOTE — Progress Notes (Signed)
Speech Language Pathology Daily Session Note  Patient Details  Name: Lauren [name] MRN: [id number] Date of Birth: 26-Nov-1980  Today's Date: 02/17/2022 SLP Individual Time: 2574-9355 SLP Individual Time Calculation (min): 60 min  Short Term Goals: Week 3: SLP Short Term Goal 1 (Week 3): STG=LTG due to ELOS 10/20  Skilled Therapeutic Interventions:Skilled ST services focused on cognitive skills. SLP facilitated sustained attention, left scanning, recall and mildly complex problem solving in organization of closet task. Pt required max A verbal cues for anchor to scan left while reading and max A verbal cues for redirection after the initial 15 minutes and then required mod A verbal cues for memory within the task. Pt completed problem solving portions with min A verbal cues but was limited by reduced attention, left neglect and recall deficits. Pt supports limited sleep impacting function today. SLP provided strategy of self-talk and thinking aloud to increase attention and recall. Pt was left in room with call bell within reach and chair alarm set. SLP recommends to continue skilled services.     Pain Pain Assessment Pain Score: 0-No pain  Therapy/Group: Individual Therapy  Elynn Patteson  [facility] 02/17/2022, 1:30 PM

## 2022-02-17 NOTE — Progress Notes (Signed)
Physical Therapy Discharge Summary  Patient Details  Name: Lauren [name] MRN: [id number] Date of Birth: 01-16-1981  Date of Discharge from PT service:February 18, 2022   Patient has met 6 of 11 long term goals due to improved activity tolerance, improved balance, improved postural control, increased strength, increased range of motion, ability to compensate for deficits, improved attention, improved awareness, and improved coordination.  Patient to discharge at a wheelchair level  CGA/min A . Pt mod I with bed mobility, supervision for sit to stand transfers, CGA stand pivot and car transfers and min A for gait up to 30 ft with RW and L hand splint. Pt is independent for dynamic sitting balance and requires supervision for dynamic standing balance.  Patient's family participated in training and demonstrates competency with providing necessary assistance for functional mobility.   Reasons goals not met: Pt did not meet car transfer goal as she requires min A for L LE management due to strength deficits. Pt did not meet gait goals due to LE strength and activity tolerance deficits. Pt max gait distance ~30 ft with RW/L hand splint and min A. Stair goal is not applicable as pt has ramp to enter her residence and pt did not meet w/c mobility goals as she is unable to steer with R UE/LE technique following CVA and requires total A for w/c transport.   Recommendation:  Patient will benefit from ongoing skilled PT services in home health setting to continue to advance safe functional mobility, address ongoing impairments in strength, balance, coordination, activity tolerance, and minimize fall risk.  Equipment: Manual w/c and RW with L hand splint for hemiplegia   Reasons for discharge: treatment goals met and discharge from hospital  Patient/family agrees with progress made and goals achieved: Yes  PT Discharge Precautions/Restrictions Restrictions Weight Bearing Restrictions: No Pain  Interference Pain Interference Pain Effect on Sleep: 0. Does not apply - I have not had any pain or hurting in the past 5 days Pain Interference with Therapy Activities: 1. Rarely or not at all Pain Interference with Day-to-Day Activities: 1. Rarely or not at all Cognition Overall Cognitive Status: Impaired/Different from baseline Arousal/Alertness: Awake/alert Orientation Level: Oriented X4 Memory: Impaired Awareness: Impaired Problem Solving: Impaired Safety/Judgment: Impaired Comments: impulsive Sensation Sensation Light Touch: Appears Intact Proprioception: Appears Intact Additional Comments: grossly diminished L LE compared to RLE Coordination Gross Motor Movements are Fluid and Coordinated: No Fine Motor Movements are Fluid and Coordinated: Yes Coordination and Movement Description: grossly uncoordinated due to L hemiplegia Finger Nose Finger Test: intact Heel Shin Test: intact bilaterally Motor  Motor Motor: Hemiplegia Motor - Skilled Clinical Observations: left hemiplegia with gross improvements in strength,coordination and mobility  Mobility Bed Mobility Bed Mobility: Rolling Right;Rolling Left;Supine to Sit;Sit to Supine Rolling Right: Independent with assistive device Rolling Left: Independent with assistive device Right Sidelying to Sit: Independent with assistive device Left Sidelying to Sit: Independent with assistive device Supine to Sit: Independent with assistive device Sit to Supine: Independent with assistive device Transfers Transfers: Sit to Stand;Stand to Sit;Stand Pivot Transfers Sit to Stand: Supervision/Verbal cueing Stand to Sit: Supervision/Verbal cueing Stand Pivot Transfers: Contact Guard/Touching assist Squat Pivot Transfers: Contact Guard/Touching assist Transfer (Assistive device): None Locomotion  Gait Ambulation: Yes Gait Assistance: Minimal Assistance - Patient > 75% Gait Distance (Feet): 30 Feet (ft) Assistive device: Rolling  walker (left hand splint) Gait Assistance Details: Verbal cues for precautions/safety;Visual cues/gestures for sequencing;Verbal cues for technique Gait Assistance Details: decreased left weight shift and right knee instability  with gait Gait Gait: Yes Gait Pattern: Impaired Gait Pattern: Narrow base of support;Decreased step length - right;Decreased step length - left;Step-to pattern;Ataxic;Shuffle Gait velocity: decreased Stairs / Additional Locomotion Stairs: No Pick up small object from the floor assist level: Moderate Assistance - Patient 50 - 74% Wheelchair Mobility Wheelchair Mobility: Yes Wheelchair Assistance: Total Assistance - Patient <25% Wheelchair Propulsion: Other (comment) (Total A) Wheelchair Parts Management: Needs assistance Distance: 300 ft  Trunk/Postural Assessment  Cervical Assessment Cervical Assessment: Exceptions to [facility] (forward head) Thoracic Assessment Thoracic Assessment: Exceptions to [facility] (rounded shoulders) Lumbar Assessment Lumbar Assessment: Exceptions to [facility] (posterior pelvic tilt) Postural Control Postural Control: Deficits on evaluation Trunk Control: left hemi, left inattention Righting Reactions: delayed  Balance Balance Balance Assessed: Yes Static Sitting Balance Static Sitting - Balance Support: Feet supported;Right upper extremity supported Static Sitting - Level of Assistance: 6: Modified independent (Device/Increase time) Dynamic Sitting Balance Dynamic Sitting - Balance Support: Feet supported Dynamic Sitting - Level of Assistance: 6: Modified independent (Device/Increase time) Dynamic Sitting - Balance Activities: Reaching for objects;Reaching across midline;Forward lean/weight shifting;Lateral lean/weight shifting Static Standing Balance Static Standing - Balance Support: During functional activity;Bilateral upper extremity supported Static Standing - Level of Assistance: 5: Stand by assistance Dynamic Standing Balance Dynamic  Standing - Balance Support: Bilateral upper extremity supported Dynamic Standing - Level of Assistance: 4: Min assist Dynamic Standing - Balance Activities: Lateral lean/weight shifting;Reaching across midline;Reaching for objects Extremity Assessment  RLE Assessment RLE Assessment: Within Functional Limits RLE Strength RLE Overall Strength Comments: grossly 4+/5 LLE Assessment LLE Assessment: Exceptions to [facility] LLE Strength LLE Overall Strength Comments: grossly 3+/5 except ankle DF 2/5   Tanja Port PT, DPT  02/17/2022, 12:00 PM

## 2022-02-17 NOTE — Progress Notes (Signed)
Occupational Therapy Session Note  Patient Details  Name: Lauren [name] MRN: [id number] Date of Birth: 1980-10-12  Today's Date: 02/17/2022 OT Individual Time: 2836-6294 OT Individual Time Calculation (min): 28 min    Short Term Goals: Week 3:  OT Short Term Goal 1 (Week 3): Pt will stand sinkside for all grooming activity with close s for up to 4- 5 minutes OT Short Term Goal 2 (Week 3): Pt will use RW to access toilet and shower with min A and mod cues for safety, awareness and L sided scanning using L hand walker splint OT Short Term Goal 3 (Week 3): Pt will demonstrate UB dressing with set up only incl pull over bra OT Short Term Goal 4 (Week 3): Pt will perform transfer to shower mirroring home set up with close S and mod cues  Skilled Therapeutic Interventions/Progress Updates:    Pt resting in w/c upon arrival. OT intervention with focus on LUE/hand retrogarde massage and attention to Lt. Retrograde massage for edema mgmt. Slight improvement with massage. Pt required mod verbal cues for attention to left to locate several items/objects in room. Pt remained in w/c with all needs within reach.   Therapy Documentation Precautions:  Precautions Precautions: Fall Precaution Comments: L inattention, R gaze preference, L hemiplegia, no dressing to L foot - wear post-op shoe OOB Required Braces or Orthoses: Other Brace Other Brace: dressing on L foot Restrictions Weight Bearing Restrictions: No    Pain: Pain Assessment Pain Scale: 0-10 Pain Score: 0-No pain  Therapy/Group: Individual Therapy  Leroy Libman 02/17/2022, 10:17 AM

## 2022-02-17 NOTE — Progress Notes (Signed)
Pt refused to take 6pm insulin dose of 23 units. States her daughter is bringing her Dexcom and insulin tonight.   Shavontae Gibeault Allegra Grana

## 2022-02-17 NOTE — Progress Notes (Signed)
Pt is Alert and oriented x 4 and refusing to receive insulin for this shift. PT's family member brought her insulin pump from home to self administer. Pt and daughter states that one of the providers approved for her to bring it and that she will not be receiving any insulin from staff. On call Linna Hoff was called and made aware. This nurse was advised to let the pt continue with self administration and to monitor her. No further concerns at this time. Call bell in reach.

## 2022-02-17 NOTE — Progress Notes (Signed)
PROGRESS NOTE   Subjective/Complaints:  Nauseated this AM and yesterday AM- when got up to pee- got anti nausea meds- starting to work along with ginger ale.   No dysuria or signs of infection per pt.  Didn't sleep "a wink" last night- wants trazodone increased.  Chronic insomnia- took trazodone at home.   Cr 2.53- and BUN 38  ROS:   Pt denies SOB, abd pain, CP, (+) N/V/C/D, and vision changes    Objective:   No results found.  Recent Labs    02/15/22 0551  WBC 9.2  HGB 9.3*  HCT 28.9*  PLT 435*   Recent Labs    02/15/22 0551 02/17/22 0551  NA 133* 139  K 4.4 3.7  CL 99 103  CO2 23 25  GLUCOSE 395* 88  BUN 39* 38*  CREATININE 2.47* 2.53*  CALCIUM 8.6* 9.0    Intake/Output Summary (Last 24 hours) at 02/17/2022 0806 Last data filed at 02/16/2022 1843 Gross per 24 hour  Intake 1200 ml  Output --  Net 1200 ml         Physical Exam: Vital Signs Blood pressure 108/62, pulse 85, temperature 98.6 F (37 C), temperature source Oral, resp. rate 16, weight 84.5 kg, last menstrual period 11/01/2021, SpO2 99 %.      General: awake, alert, appropriate, looks tired- sitting on EOB;  NAD HENT: conjugate gaze; oropharynx moist CV: regular rate; no JVD Pulmonary: CTA B/L; no W/R/R- good air movement- still sounds good GI: soft, NT, ND, (+)BS- hypoactive Psychiatric: appropriate- flat; slightly delayed responses Neurological: alert- less interactive L foot with dressing wrapped in ace wrap- C/D/I Tenting in hands B/L   PE from prior encounter: Skin: + L 5th toe amputation; wound site open with mild serous drainage, clean wound bed, no erythema or warmth.  Seen wrapped up in ACE wrap- C/D/I MSK:      No apparent deformity.      Strength:                RUE: 5/5 SA, 5/5 EF, 5/5 EE, 5/5 WE, 5/5 FF, 5/5 FA                 LUE: flaccid, 0/5 throughout                RLE: 5/5 HF, 5/5 KE, 5/5 DF, 5/5 EHL,  5/5 PF                 LLE:  3/5 HF, 2/5 KE, 0/5 DF, 0/5 EHL, 1/5 PF    Neurologic exam:  Cognition: AAO to person, place, time and event.  Language: Fluent, No substitutions or neoglisms.moderate dysarthria Mood: Pleasant affect, appropriate mood.  Sensation: To light touch intact in BL UEs and LEs  CN: Left facial droop   Assessment/Plan: 1. Functional deficits which require 3+ hours per day of interdisciplinary therapy in a comprehensive inpatient rehab setting. Physiatrist is providing close team supervision and 24 hour management of active medical problems listed below. Physiatrist and rehab team continue to assess barriers to discharge/monitor patient progress toward functional and medical goals  Care Tool:  Bathing    Body parts bathed  by patient: Right arm, Left arm, Chest, Abdomen, Buttocks, Right upper leg, Left upper leg, Face, Front perineal area, Right lower leg   Body parts bathed by helper: Front perineal area, Right lower leg, Left lower leg Body parts n/a: Left lower leg   Bathing assist Assist Level: Minimal Assistance - Patient > 75%     Upper Body Dressing/Undressing Upper body dressing   What is the patient wearing?: Bra, Pull over shirt    Upper body assist Assist Level: Minimal Assistance - Patient > 75%    Lower Body Dressing/Undressing Lower body dressing      What is the patient wearing?: Incontinence brief, Pants     Lower body assist Assist for lower body dressing: Minimal Assistance - Patient > 75%     Toileting Toileting Toileting Activity did not occur (Clothing management and hygiene only): N/A (no void or bm)  Toileting assist Assist for toileting: Minimal Assistance - Patient > 75%     Transfers Chair/bed transfer  Transfers assist     Chair/bed transfer assist level: Moderate Assistance - Patient 50 - 74%     Locomotion Ambulation   Ambulation assist   Ambulation activity did not occur: Safety/medical  concerns  Assist level: Minimal Assistance - Patient > 75% Assistive device: Walker-rolling (left hand splint) Max distance: 55 ft   Walk 10 feet activity   Assist  Walk 10 feet activity did not occur: Safety/medical concerns  Assist level: Minimal Assistance - Patient > 75% Assistive device: Walker-rolling   Walk 50 feet activity   Assist Walk 50 feet with 2 turns activity did not occur: Safety/medical concerns  Assist level: Minimal Assistance - Patient > 75% Assistive device: Walker-rolling    Walk 150 feet activity   Assist Walk 150 feet activity did not occur: Safety/medical concerns         Walk 10 feet on uneven surface  activity   Assist Walk 10 feet on uneven surfaces activity did not occur: Safety/medical concerns         Wheelchair     Assist Is the patient using a wheelchair?: Yes Type of Wheelchair: Manual    Wheelchair assist level: Total Assistance - Patient < 25% Max wheelchair distance: 300 ft    Wheelchair 50 feet with 2 turns activity    Assist        Assist Level: Total Assistance - Patient < 25%   Wheelchair 150 feet activity     Assist      Assist Level: Total Assistance - Patient < 25%   Blood pressure 108/62, pulse 85, temperature 98.6 F (37 C), temperature source Oral, resp. rate 16, weight 84.5 kg, last menstrual period 11/01/2021, SpO2 99 %.  Medical Problem List and Plan: 1. 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             -patient may shower with lines and foot wound covered             -ELOS/Goals: 14-21 days  WBAT On LLE D/c 10/20 Con't CIR- PT ,OT and SLP 2.  Antithrombotics: -DVT/anticoagulation:  Pharmaceutical: Lovenox             -antiplatelet therapy: Brilinta 3. Pain Management: continue Tylenol, oxycodone as needed. Left arm sling ordered.   4. Insomnia: LCSW to evaluate and provide support  Replace tizanidine with ramelteon and magnesium supplement at 7pm when patient likes to sleep             -antipsychotic agents: n/a 5. Neuropsych/cognition: This patient is capable of making decisions on her own behalf. 6. 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 Fluids/Electrolytes/Nutrition: Strict Is and Os and follow-up chemistries             -dys 3/thin liquids; SLP eval             -repeat mag/phos tomorrow 8: Right ICA dissection with stent placement: on Brilinta 9: Hypertension: monitor             -continue hydralazine 12.5 mg TID             -continue isosorbide 20 mg BID             -continue Lopressor 37.5 mg BID  10/2- BP is controlled- con't regimen  10/3- had BP of 80s/50s yesterday- hydralazine stopped- is better this AM- will see how does in therapy with standing- concern will drop  10/4- will decrease Metoprolol to 25 mg BID- not change to succinate, since won't have ability to change meds as often.   10/5- BP overnight 110s/120s supine- will see how tolerates therapy today.   10/6- BP doing better, they increased her B blocker back to 37.5 mg BID   10/13 well controlled, continue to monitor  10/16- BP very slightly elevated, but overall controlled- con't regimen  10/17- BP doing great this Am- con't regimen  10/18- BP labile- [phone] Systolic in last 24 hours-     02/17/2022    5:31 AM 02/17/2022    5:00 AM 02/16/2022    8:06 PM  Vitals with BMI  Weight  186 lbs 5 oz   Systolic 629  476  Diastolic 62  74  Pulse 85  99    10: Hyperlipidemia: continue Lipitor 40 mg daily 11: Coagulopathy s/p RUE embolectomy 10/2021: Eliquis PTA now on Lovenox; f                -follow-up hematology as outpt 12: COPD exacerbation: hypoxia resolved; continue bronchodilators/nebs 13: Chronic diastolic and systolic heart failure:             -strict Is and  Os and daily weight             -no diuretics currently             -see meds above   Filed Weights   02/14/22 0502 02/15/22 0503 02/17/22 0500  Weight: 85.5 kg 84.5 kg 84.5 kg   10/5- will start Lasix 10 mg daily due to vascualar congestion on CXR  10/6- Per Cards, needs 40 mg daily- will change  10/7 weight appears stable, monitor  10/14: weight is up 2kg, add additional 20mg  lasix today  10/16- will decrease lasix to 20 mg daily since Cr up significantly to 2.46 this AM- that's the highest it's been- usually/lately 2.1 and BUN is 39- will recheck Wednesday.   10/17- weight down 1 kg in last 4 days- isn't increasing, so will not change Lasix dose again 14: Hypothyroidism: continue Synthroid 125 mcg daily; follow-up TSH 15: 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   10/3- ate breakfast tray this AM- BG's running from 56 (low yesterday) to 312 yesterday due to foods form home- will ask DM coordinator to help try and manage.   10/4- CBG's mid 100s to mid 200s- a little better in last 24 hours- no hypoglycemia  10/6- Dm coordinator - increased meals novolog 5 units TID with meals and night/QHS 0-5 SSI  10/7 increase Novolog to 7 units TID  10/8 CBGs elevated last night, a little better today, continue monitor response to insulin adjustment  10/10 increase levemir to 27u  10/11 DM coordinator recommending 10 U novolog TID meal coverage and 25 U levemir BID, will make these changes   10/13 well controlled overall, continue current regimen  10/16- still eating food from home- BG's 168-388 this AM- if stays up, will call DM coordinator back  10/17- BG's much  better today- 138-228- con't regimen-   10/18- is eating from home- 79-414 in last 24 hours- but except for 414, was doing better- give 1 more day- and then call DM educator/coordinator back for recs.  17: Class 2 obesity: dietary counseling 18: Tobacco abuse: cessation counseling; continue nicotine patch 19: AKI atop CKD IV: trend BMP  -9/29 Cr improved to 1.88  10/2- Down to 1.59 and BUN 24- will push fluids and recheck in AM  10/3- Cr 1.64 and BUN 24- basically stable- will recheck Thursday-   10/4- ordered BMP for tomorrow  10/5- Cr 2.12 up from 1.64- but required Lasix 40 mg PO x1- actually will start lasix 10 mg daily due to pulm/vascular congestion-   10/6- CR usually runs ~ 2 per Cards note-   -10/10 CR stable at 2.16  10/16- Cr up to 2.46 and BUN 39- will decrease Lasix to 20 mg daily to see if helps; recheck Wednesday  10/18- Cr up very slightly to 2.53 and BUN stable at 38- no increased resp Sx's/ and sounds similar on exam-  20: CAD: stable; treated medically; Dr. Harl Bowie 21: Left foot osteomyelitis s/p ray amputation [name] -recent bone biopsy; cultures neg - Husband to bring in ?CAM boot for weightbearing, protection - Remove IJ order - 9/30 Noted surgical site dehiscence with PT ambulation, was not wearing orthotic. Podiatry consulted, imaging showing mildly angulated fracture of the distal fourth metatarsal (likely iatrogenic from prior amputation)               - Orders for betadyne dressing daily               - NWB LLE               - Appreciate podiatry recs, to discuss 4th MT fx as group next week  10/2- Podiatry says once has CAM boot to be worn- either from home or for [name] to order- d/w PA-  and will check in to it. Supposedly had a dehiscence but podiatry to see again today.   10/3- podiatry said they'd come back for possible bedside debridement- can WBAT now with CAM boot  10/11 Seen by Podiatry for L foot ulcer,  Iodosorb and gauze  dressings daily, WBAT LLE in  surgical shoe 22: Leukocytosis: trending down; follow-up CBC  -WBC down to 7/1 on 10/9  10/18- WBC 9.2 23. Dysphagia  -Advanced to heart healthy/carb modified/ thin liquids            - 9/30 - family requesting diet liberalization as patient primarily eating foods brought in from outside d/t hospital food bland; carb modified diet unable to be ordered with less restrictive parameters. Dietary consulted.   24. RSV  10/2- will take off Droplet precautions- no more Sx's.  25. Chest pain- in setting of severe cardiac issues including CHF with EF 35-40%, CAD- acute on chronic CHF  10/5- CP last night- required NTG x2- EKG had no changes- troponins elevated probably due to pulmonary congestion which was also found- will transfuse 1 unit to help CP; also restart her home Protonix for hx of reflux.  10/10 CP last night, resolved with NTG, troponins not increased above baseline 26, ABLA  10/5- will transfuse 1 unit- think the Hb 7.8 that came back 5 hours later is likely due to hemoconcentration, and isn't real, since her Hb has been trending down for days. On Lovenox and Brillenta- check Fecal occult for blood loss.   HGB stable at 8.8 on 10/9   10/18- Hb 9.3- improving 27. Insomnia  10/5- will increase trazodone to 75 mg QHS per pt request  10/6- slepeing better per pt.   10/12 Increase trazodone to 100mg  QHS  10/18- pt's trazodone not on list- not clear why/when d/c'd- will restart at 50 mg QHS- since didn't sleep at all last night 28. Constipation  10/17- had enema last night and resolved for now- having bowel /constipation frequently since Stroke- will monitor closely.    Will call IM consult since having so many issues with Kidneys/CHF and Won't eat diet here- only home food, so BG's are out of control.    I spent a total of 37  minutes on total care today- >50% coordination of care- due to calling IM consult and d/w PA.    LOS: 20 days A FACE TO FACE EVALUATION WAS PERFORMED  Lauren [name] 02/17/2022, 8:06 AM

## 2022-02-17 NOTE — Inpatient Diabetes Management (Signed)
Inpatient Diabetes Program Recommendations  AACE/ADA: New Consensus Statement on Inpatient Glycemic Control (2015)  Target Ranges:  Prepandial:   less than 140 mg/dL      Peak postprandial:   less than 180 mg/dL (1-2 hours)      Critically ill patients:  140 - 180 mg/dL   Lab Results  Component Value Date   GLUCAP 79 02/17/2022   HGBA1C 8.0 (H) 01/09/2022    Latest Reference Range & Units 02/16/22 06:25 02/16/22 11:34 02/16/22 17:12 02/16/22 21:36 02/17/22 06:51  Glucose-Capillary 70 - 99 mg/dL 138 (H) 169 (H) 414 (H) 238 (H) 79    DM1  (does not make insulin.  Needs correction, basal and meal coverage)   Outpatient Diabetes medications:  Per Whitney with [facility]-last settings on 9/10  Basal rate 12a-12p = 0.9 units/hr--total 24 hr basal 21.6 units Insulin: Carb ratio= 1 unit/10 g  Correction Sensitivity= 38 mg/dL  Target BG= 120  Active time= 4 hrs    Current orders for Inpatient glycemic control:  Levemir 27 units BID Novolog 0-20 units tid + hs Novolog 10 units tid meal coverage  Note: Glucose trends increase in response to Carbohydrate intake. Pt requiring at least 10 of Novolog at each meal time.  Inpatient Diabetes Program Recommendations:     -   Increase Novolog meal coverage to Novolog 12 units   Note: pt did not receive meal covera this am, Glucose 79. Will be high at lunch due to lack of carbohydrate coverage.   Thanks,  Tama Headings RN, MSN, BC-ADM Inpatient Diabetes Coordinator Team Pager [phone] (8a-5p)

## 2022-02-17 NOTE — Consult Note (Signed)
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  Her has been down to as low as 1.59 on 10/2.  Baseline had been thought to be around 2-2.5.  During prior hospitalization diuretics were held with some improvement.   -Check urine creatinine, urine sodium, urine urea -Check urinalysis -Hold Lasix as patient creatinine continues to trend up at this time.  May need dose adjustment, but this could be patient's baseline kidney function.  Uncontrolled diabetes mellitus type 1 Over the last day patient's blood sugars range from 79 this morning but 444 yesterday evening.  Last hemoglobin A1c 8 on 01/09/2022.  She reports being on insulin pump at home however her basal rate is significantly lower than the current regimen she has been receiving while in rehab. -Hypoglycemic protocols -Continue Levemir 27 units twice daily -Increased mealtime insulin to 12 units 3 times daily -Continue CBGs before every meal with resistant SSI -Patient diabetic education  Chronic diastolic and systolic CHF Patient does not appear grossly fluid overloaded at this time no signs of JVD on physical exam.  Echocardiogram revealed EF to be 35 to 40% with grade 2 diastolic dysfunction. -Strict I&Os and daily weights -Continue to monitor and determine when medically appropriate to resume Lasix with consideration for dose adjustment  Anemia  chronic disease Hemoglobin noted to be stable 9.3 last on 10/16.  Essential hypertension Pressures range from 108/62-161/74. 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  Patient had initially been on antibiotics but were discontinued after cultures were noted to be negative.  Hypothyroidism -Continue levothyroxine  Hyperlipidemia -Continue fenofibrate and Lipitor  Tobacco abuse -Continue nicotine patch -Continue to counsel on need of cessation of tobacco  Obesity BMI 31 kg/m  TRH will continue to follow the patient.  HPI: Lauren [name] is a 41 y.o. female with past medical history of CAD, HFrEF, recent arterial thrombectomy of the right upper extremity due to limb ischemia, PAD, on chronic anticoagulation, DM type 1 with peripheral neuropathy, and hypothyroidism is admitted with left-sided weakness found to have right MCA stroke 9/8.  Patient required intubation and was initially admitted admitted to neurology service and was seen by IR for thrombolysis of cerebral infarction, right ICA dissections s/p stent placement.  She had been on Eliquis prior to admission. During the hospital stay she had required intubation and was being followed by PCCM.  Patient was noted to have concern for acute renal failure thought possibly secondary to contrast-induced nephropathy along with acute on chronic systolic and diastolic heart failure with concern for NSTEMI.  Cardiology been consulted as EF was acutely decreased to 30 to 35%  for which patient has been placed on heparin, but ultimately medical management recommended.   She required multiple units of blood due to drop hemoglobin without clear source of bleeding.  Patient was also noted to be febrile which she had a left  foot ulcer where she previously had osteomyelitis.  ID had been for and adjust antibiotics during  that time period.  Patient's kidney function seem to improve with holding diuretics from review of records.  Patient was transitioned to inpatient rehab for which cardiology had been reconsulted on 10/3 due to concern for hypotension.  Adjustments were made to the patient's medication regimen and patient has been started back on diuretics couple of days.  Currently on Lasix 20 mg daily.  Creatinine has trended up from 1.52 on 10/2 up to 2.52 to currently.  Blood sugars have ranged from 79 -414.  Patient reports that if she had her insulin pump her blood sugars would be better.   Review of Systems: As mentioned in the history of present illness. All other systems reviewed and are negative. Past Medical History:  Diagnosis Date   Anemia    CAD (coronary artery disease)    a. s/p cath in 03/2014 showing 30% mid-LAD, moderate to severe disease along small D1, patent LCx, moderate to severe distal OM2 stenosis and moderate diffuse diease along RCA not amenable to PCI   CHF (congestive heart failure) ([place])    a. EF 55-60% in 12/2019 b. EF at 35-40% by echo in 05/2020   CKD (chronic kidney disease) stage 4, GFR 15-29 ml/min (HCC)    Diabetes mellitus without complication ([place])    Myocardial infarction ([place])    Neuropathy    Stroke [facility])    Past Surgical History:  Procedure Laterality Date   AMPUTATION Left 09/02/2021   Procedure: AMPUTATION RAY;  Surgeon: Criselda Peaches, DPM;  Location: [place];  Service: Podiatry;  Laterality: Left;  sagittal saw, 3L bag saline & Pulse   Cardiac catherization     CHOLECYSTECTOMY     I & D EXTREMITY Left 08/30/2021   Procedure: IRRIGATION AND DEBRIDEMENT WITH BONE BIOPSY;  Surgeon: [name], DPM;  Location: [place];  Service: Podiatry;  Laterality: Left;   IR ANGIO VERTEBRAL SEL SUBCLAVIAN INNOMINATE UNI L MOD SED  01/18/2022   IR CT HEAD LTD  01/08/2022   IR INTRA CRAN STENT  01/08/2022   IR PERCUTANEOUS ART THROMBECTOMY/INFUSION INTRACRANIAL INC DIAG ANGIO   01/08/2022   IRRIGATION AND DEBRIDEMENT FOOT Left 09/04/2021   Procedure: IRRIGATION AND DEBRIDEMENT FOOT AND CLOSURE;  Surgeon: Criselda Peaches, DPM;  Location: [place];  Service: Podiatry;  Laterality: Left;   RADIOLOGY WITH ANESTHESIA N/A 01/08/2022   Procedure: IR WITH ANESTHESIA;  Surgeon: Radiologist, Medication, MD;  Location: [place];  Service: Radiology;  Laterality: N/A;   THROMBECTOMY BRACHIAL ARTERY Right 11/18/2021   Procedure: RIGHT BRACHIAL, RADIAL, & ULNAR ARTERY THROMBECTOMY.;  Surgeon: Marty Heck, MD;  Location: [place];  Service: Vascular;  Laterality: Right;   TUBAL LIGATION     Social History:  reports that she quit smoking about 15 months ago. Her smoking use included cigarettes. She smoked an average of .25 packs per day. She has never used smokeless tobacco. She reports that she does not drink alcohol and does not use drugs.  Allergies  Allergen Reactions   Wellbutrin [Bupropion] Hives   Cefepime Rash    Tolerates penicilllin   Ciprofloxacin Hcl Hives and Rash    Hives/rash at injection site; 01/15/22 tolerated IV cipro   Tape Rash    Family History  Problem Relation Age of Onset   Thyroid disease Mother    Hypertension Mother    Hyperlipidemia Mother    CAD Mother  CVA Mother    Hyperlipidemia Father    Hypertension Father    CAD Father    Breast cancer Paternal Grandmother 22   Cancer Paternal Grandmother        lung    Prior to Admission medications   Medication Sig Start Date End Date Taking? Authorizing Provider  apixaban (ELIQUIS) 5 MG TABS tablet Take 5 mg by mouth 2 (two) times daily.   Yes [provider]  enoxaparin (LOVENOX) 80 MG/0.8ML injection Inject 0.8 mLs (80 mg total) into the skin daily. 01/28/22  Yes Pahwani, Einar Grad, MD  arformoterol (BROVANA) 15 MCG/2ML NEBU Take 2 mLs (15 mcg total) by nebulization 2 (two) times daily. 01/28/22   Darliss Cheney, MD  atorvastatin (LIPITOR) 40 MG tablet TAKE 1 TABLET BY MOUTH ONCE DAILY. Patient  taking differently: Take 40 mg by mouth daily. 09/24/21   Barrett, Evelene Croon, PA-C  budesonide (PULMICORT) 0.5 MG/2ML nebulizer solution Take 2 mLs (0.5 mg total) by nebulization 2 (two) times daily. 01/28/22   Darliss Cheney, MD  Continuous Blood Gluc Sensor (DEXCOM G6 SENSOR) MISC Change sensor every 10 days as directed 09/29/21   Brita Romp, NP  Continuous Blood Gluc Transmit (DEXCOM G6 TRANSMITTER) MISC Change transmitter every 90 days as directed. 09/29/21   Brita Romp, NP  hydrALAZINE (APRESOLINE) 25 MG tablet Take 0.5 tablets (12.5 mg total) by mouth every 8 (eight) hours. 01/28/22 02/27/22  Darliss Cheney, MD  insulin aspart (NOVOLOG) 100 UNIT/ML injection Inject 0-20 Units into the skin every 4 (four) hours. 01/28/22   Darliss Cheney, MD  insulin detemir (LEVEMIR) 100 UNIT/ML injection Inject 0.25 mLs (25 Units total) into the skin 2 (two) times daily. 01/28/22   Darliss Cheney, MD  Insulin Disposable Pump (OMNIPOD 5 G6 INTRO, GEN 5,) KIT Change pod every 48-72 hrs 10/19/21   Brita Romp, NP  Insulin Disposable Pump (OMNIPOD 5 G6 POD, GEN 5,) MISC Change pod every 48-72 hours 10/19/21   Brita Romp, NP  isosorbide mononitrate (ISMO) 20 MG tablet Take 1 tablet (20 mg total) by mouth 2 (two) times daily. 01/28/22 02/27/22  Darliss Cheney, MD  Levothyroxine Sodium 200 MCG CAPS Take 200 mcg by mouth daily before breakfast.    [provider]  Metoprolol Tartrate 37.5 MG TABS Take 37.5 mg by mouth 2 (two) times daily. 01/28/22 02/27/22  Darliss Cheney, MD  nitroGLYCERIN (NITROSTAT) 0.4 MG SL tablet Place 1 tablet (0.4 mg total) under the tongue every 5 (five) minutes x 3 doses as needed for chest pain. 09/24/21   Barrett, Evelene Croon, PA-C  pantoprazole (PROTONIX) 40 MG tablet Take 1 tablet (40 mg total) by mouth daily as needed for acid reflux Patient taking differently: Take 40 mg by mouth daily as needed (heartburn). 11/26/20     revefenacin (YUPELRI) 175 MCG/3ML nebulizer  solution Take 3 mLs (175 mcg total) by nebulization daily. 01/29/22   Darliss Cheney, MD  ticagrelor (BRILINTA) 90 MG TABS tablet Take 1 tablet (90 mg total) by mouth 2 (two) times daily. 01/28/22 02/27/22  Darliss Cheney, MD    Physical Exam: Vitals:   02/16/22 1457 02/16/22 2006 02/17/22 0500 02/17/22 0531  BP: 127/73 (!) 161/74  108/62  Pulse: 86 99  85  Resp: 18 16  16   Temp: 98 F (36.7 C) 98.1 F (36.7 C)  98.6 F (37 C)  TempSrc:  Oral  Oral  SpO2: 100% 100%  99%  Weight:   84.5 kg  Constitutional: Middle-aged female currently in no acute distress Eyes: PERRL, lids and conjunctivae normal ENMT: Mucous membranes are moist  Neck: normal, supple, no JVD. Respiratory: clear to auscultation bilaterally, no wheezing, no crackles. Normal respiratory effort. No accessory muscle use.  Cardiovascular: Regular rate and rhythm, no murmurs / rubs / gallops. No extremity edema.   Abdomen: no tenderness, no masses palpated.   Bowel sounds positive.  Musculoskeletal: no clubbing / cyanosis.  Amputation of left fifth digit currently bandaged..  Neurologic: CN 2-12 grossly intact. Sensation intact, DTR normal. Strength 5/5 in all 4.  Psychiatric: Normal judgment and insight. Alert and oriented x 3. Normal mood.   Data Reviewed:   Reviewed labs and pertinent records.   Primary team communication:  Thank you very much for involving [name] in the care of your patient.  Author: Norval Morton, MD 02/17/2022 8:38 AM  For on call review [email or web address].

## 2022-02-17 NOTE — Progress Notes (Signed)
ANTICOAGULATION CONSULT NOTE - Initial Consult  Pharmacy Consult for enoxaparin Indication: Right Brachial Artery Occlusion, possible hypercoagulable state  Allergies  Allergen Reactions   Wellbutrin [Bupropion] Hives   Cefepime Rash    Tolerates penicilllin   Ciprofloxacin Hcl Hives and Rash    Hives/rash at injection site; 01/15/22 tolerated IV cipro   Tape Rash    Patient Measurements: Weight: 84.5 kg (186 lb 4.6 oz) Heparin Dosing Weight: 86 kg  Vital Signs: Temp: 98.6 F (37 C) (10/18 0531) Temp Source: Oral (10/18 0531) BP: 108/62 (10/18 0531) Pulse Rate: 85 (10/18 0531)  Labs: Recent Labs    02/15/22 0551 02/17/22 0551  HGB 9.3*  --   HCT 28.9*  --   PLT 435*  --   CREATININE 2.47* 2.53*     Estimated Creatinine Clearance: 32 mL/min (A) (by C-G formula based on SCr of 2.53 mg/dL (H)).   Medical History: Past Medical History:  Diagnosis Date   Anemia    CAD (coronary artery disease)    a. s/p cath in 03/2014 showing 30% mid-LAD, moderate to severe disease along small D1, patent LCx, moderate to severe distal OM2 stenosis and moderate diffuse diease along RCA not amenable to PCI   CHF (congestive heart failure) ([place])    a. EF 55-60% in 12/2019 b. EF at 35-40% by echo in 05/2020   CKD (chronic kidney disease) stage 4, GFR 15-29 ml/min (HCC)    Diabetes mellitus without complication ([place])    Myocardial infarction ([place])    Neuropathy    Stroke [facility])       Assessment: 41 yo W with right brachial artery occlusion w/ ischemia s/p thrombectomy 10/2021 now admitted with CVA 01/08/2022 while on apixaban. Hypercoagulable work-up showed abnormal lupus anticoagulant but was also on heparin, slightly increased rheumatoid factor. Patient transferred to CIR on 01/28/22. Patient needs repeat hypercoagulable labs in 3 months. Pharmacy consulted for enoxaparin.  No signs of bleeding. Patient with two therapeutic levels on this dose, will stop checking unless renal function  changes significantly.  Scr stable on 10/18  Goal of Therapy:  LMWH level goal 1-2  Monitor platelets by anticoagulation protocol: Yes   Plan:  Continue enoxaparin 120mg  q24 hr Monitor for signs/symptoms of bleeding    Ashliegh Parekh A. Levada Dy, PharmD, BCPS, FNKF Clinical Pharmacist [place] Please utilize [employer] for appropriate phone number to reach the unit pharmacist ([place])

## 2022-02-17 NOTE — Progress Notes (Signed)
Occupational Therapy Session Note  Patient Details  Name: Lauren [name] MRN: [id number] Date of Birth: 1980-12-31  Today's Date: 02/17/2022 OT Individual Time: 1140-1220 OT Individual Time Calculation (min): 40 min    Short Term Goals: Week 3:  OT Short Term Goal 1 (Week 3): Pt will stand sinkside for all grooming activity with close s for up to 4- 5 minutes OT Short Term Goal 2 (Week 3): Pt will use RW to access toilet and shower with min A and mod cues for safety, awareness and L sided scanning using L hand walker splint OT Short Term Goal 3 (Week 3): Pt will demonstrate UB dressing with set up only incl pull over bra OT Short Term Goal 4 (Week 3): Pt will perform transfer to shower mirroring home set up with close S and mod cues  Skilled Therapeutic Interventions/Progress Updates:    Patient received seated in wheelchair talking with MD regarding Diabetes management.  Patient with flat affect this session, overall decreased attention/ left visual field deficit.  Worked on postural control in sitting and biomechanics involved in reach patterns.  Patient using trunk and scapular elevation with all attempts to move left arm.  With cueing and facilitation showed shoulder flex/extension.  Patient able to activate trace elbow flexion - when arm in line of sight, following tapping.  Patient with poor frustration tolerance.  Worked on hand to mouth pattern in LUE.  Followed with assisted reach patterns in sitting.  Patient transported back to room, and safety belt in place and turned on, and personal items within reach.    Therapy Documentation Precautions:  Precautions Precautions: Fall Precaution Comments: L inattention, R gaze preference, L hemiplegia, no dressing to L foot - wear post-op shoe OOB Required Braces or Orthoses: Other Brace Other Brace: dressing on L foot Restrictions Weight Bearing Restrictions: No  Pain:  Reports pain in hand.  Dorsum of hand and digits swollen.  Worked  in supported supine with hand elevated to help reduce edema.      Therapy/Group: Individual Therapy  Mariah Milling 02/17/2022, 12:24 PM

## 2022-02-18 ENCOUNTER — Other Ambulatory Visit (HOSPITAL_COMMUNITY): Payer: Self-pay

## 2022-02-18 LAB — UREA NITROGEN, URINE: Urea Nitrogen, Ur: 265 mg/dL

## 2022-02-18 LAB — GLUCOSE, CAPILLARY
Glucose-Capillary: 134 mg/dL — ABNORMAL HIGH (ref 70–99)
Glucose-Capillary: 195 mg/dL — ABNORMAL HIGH (ref 70–99)
Glucose-Capillary: 182 mg/dL — ABNORMAL HIGH (ref 70–99)
Glucose-Capillary: 203 mg/dL — ABNORMAL HIGH (ref 70–99)

## 2022-02-18 MED ORDER — OXYCODONE HCL 5 MG PO TABS
5.0000 mg | ORAL_TABLET | Freq: Four times a day (QID) | ORAL | 0 refills | Status: DC | PRN
  Filled 2022-02-18: qty 30, 8d supply, fill #0

## 2022-02-18 MED ORDER — RANOLAZINE ER 500 MG PO TB12
500.0000 mg | ORAL_TABLET | Freq: Two times a day (BID) | ORAL | 0 refills | Status: DC
  Filled 2022-02-18: qty 60, 30d supply, fill #0

## 2022-02-18 MED ORDER — TRAMADOL HCL 50 MG PO TABS
50.0000 mg | ORAL_TABLET | Freq: Four times a day (QID) | ORAL | Status: DC | PRN
  Administered 2022-02-18: 50 mg via ORAL
  Filled 2022-02-18: qty 1

## 2022-02-18 MED ORDER — NICOTINE 21 MG/24HR TD PT24
21.0000 mg | MEDICATED_PATCH | Freq: Every day | TRANSDERMAL | 0 refills | Status: DC
  Filled 2022-02-18: qty 28, 28d supply, fill #0

## 2022-02-18 MED ORDER — NITROGLYCERIN 0.4 MG SL SUBL
0.4000 mg | SUBLINGUAL_TABLET | SUBLINGUAL | 3 refills | Status: DC | PRN
  Filled 2022-02-18: qty 25, 7d supply, fill #0

## 2022-02-18 MED ORDER — LEVOTHYROXINE SODIUM 125 MCG PO TABS
125.0000 ug | ORAL_TABLET | Freq: Every day | ORAL | 0 refills | Status: DC
  Filled 2022-02-18: qty 30, 30d supply, fill #0

## 2022-02-18 MED ORDER — PANTOPRAZOLE SODIUM 40 MG PO TBEC
40.0000 mg | DELAYED_RELEASE_TABLET | Freq: Every day | ORAL | 0 refills | Status: DC
  Filled 2022-02-18: qty 30, 30d supply, fill #0

## 2022-02-18 MED ORDER — INSULIN DETEMIR 100 UNIT/ML FLEXPEN
27.0000 [IU] | PEN_INJECTOR | Freq: Two times a day (BID) | SUBCUTANEOUS | 11 refills | Status: DC
  Filled 2022-02-18: qty 15, 28d supply, fill #0

## 2022-02-18 MED ORDER — MAGNESIUM OXIDE 400 MG PO TABS
250.0000 mg | ORAL_TABLET | Freq: Every day | ORAL | 0 refills | Status: DC
  Filled 2022-02-18: qty 15, 30d supply, fill #0

## 2022-02-18 MED ORDER — TICAGRELOR 90 MG PO TABS
90.0000 mg | ORAL_TABLET | Freq: Two times a day (BID) | ORAL | 0 refills | Status: AC
  Filled 2022-02-18: qty 60, 30d supply, fill #0

## 2022-02-18 MED ORDER — TRAZODONE HCL 50 MG PO TABS
50.0000 mg | ORAL_TABLET | Freq: Every day | ORAL | 0 refills | Status: DC
  Filled 2022-02-18: qty 30, 30d supply, fill #0

## 2022-02-18 MED ORDER — INSULIN ASPART 100 UNIT/ML FLEXPEN
0.0000 [IU] | PEN_INJECTOR | Freq: Three times a day (TID) | SUBCUTANEOUS | 11 refills | Status: DC
  Filled 2022-02-18: qty 15, fill #0

## 2022-02-18 MED ORDER — TIZANIDINE HCL 4 MG PO TABS
4.0000 mg | ORAL_TABLET | Freq: Every day | ORAL | 0 refills | Status: DC
  Filled 2022-02-18: qty 30, 30d supply, fill #0

## 2022-02-18 MED ORDER — ACETAMINOPHEN 325 MG PO TABS
325.0000 mg | ORAL_TABLET | ORAL | 0 refills | Status: DC | PRN
  Filled 2022-02-18: qty 100, 9d supply, fill #0

## 2022-02-18 MED ORDER — ATORVASTATIN CALCIUM 40 MG PO TABS
40.0000 mg | ORAL_TABLET | Freq: Every day | ORAL | 0 refills | Status: DC
  Filled 2022-02-18: qty 30, 30d supply, fill #0

## 2022-02-18 MED ORDER — TRAMADOL HCL 50 MG PO TABS
50.0000 mg | ORAL_TABLET | Freq: Four times a day (QID) | ORAL | 0 refills | Status: DC | PRN
  Filled 2022-02-18: qty 28, 7d supply, fill #0

## 2022-02-18 MED ORDER — METHOCARBAMOL 500 MG PO TABS
500.0000 mg | ORAL_TABLET | Freq: Four times a day (QID) | ORAL | 0 refills | Status: DC | PRN
  Filled 2022-02-18: qty 60, 15d supply, fill #0

## 2022-02-18 MED ORDER — ADULT MULTIVITAMIN W/MINERALS CH: 1.0000 | ORAL_TABLET | Freq: Every day | ORAL | Status: DC

## 2022-02-18 MED ORDER — RAMELTEON 8 MG PO TABS
8.0000 mg | ORAL_TABLET | Freq: Every day | ORAL | 0 refills | Status: DC
  Filled 2022-02-18: qty 30, 30d supply, fill #0

## 2022-02-18 MED ORDER — METOPROLOL TARTRATE 25 MG PO TABS
37.5000 mg | ORAL_TABLET | Freq: Two times a day (BID) | ORAL | 0 refills | Status: DC
  Filled 2022-02-18: qty 90, 30d supply, fill #0

## 2022-02-18 MED ORDER — INSULIN PEN NEEDLE 32G X 4 MM MISC
1.0000 | Freq: Four times a day (QID) | 0 refills | Status: DC
  Filled 2022-02-18: qty 200, 30d supply, fill #0

## 2022-02-18 MED ORDER — ENOXAPARIN SODIUM 120 MG/0.8ML IJ SOSY
120.0000 mg | PREFILLED_SYRINGE | INTRAMUSCULAR | 0 refills | Status: DC
  Filled 2022-02-18: qty 24, 30d supply, fill #0

## 2022-02-18 MED ORDER — ISOSORBIDE MONONITRATE 20 MG PO TABS
20.0000 mg | ORAL_TABLET | Freq: Two times a day (BID) | ORAL | 0 refills | Status: DC
  Filled 2022-02-18: qty 60, 30d supply, fill #0

## 2022-02-18 NOTE — Progress Notes (Signed)
Physical Therapy Session Note  Patient Details  Name: Lauren [name] MRN: [id number] Date of Birth: November 18, 1980  Today's Date: 02/18/2022 PT Individual Time: 1445-1540 PT Individual Time Calculation (min): 55 min   Short Term Goals: Week 3:  PT Short Term Goal 1 (Week 3): STG=LTG due to ELOS  Skilled Therapeutic Interventions/Progress Updates: Pt presented in w/c with family present agreeable to therapy. Session focused on hands on family edu in preparation for d/c. Husband had previously been through hands on training therefore education focused primarily with dgt and sister. Pt transported to ortho gym and demonstrated car transfer to SUV height with HHA. Pt was able to perform with CGA and increased time. Pt demonstrated some decreased safety awareness when performing transfer but was able to correct with verbal cues. Pt then performed same transfer with dgt with dgt requiring some intervention for cues but was able to complete with CGA. Pt then transferred over to high/low mat where dgt and sister performed transfer to mat with and without RW. Cues were provided initially for safest place to guard however good carryover was noted. Pt also performed transfer to mat elevated to highest point to simulate bed transfer which was done with all 3 family members with HHA. Pt then transported to main gym and practiced ascending/descending stairs with husband as ramp will no be completed until weekend. All question and concerns were addressed with family stating feeling more comfortable managing pt. Pt transported back to room at end of session and remained in w/c at end of session with family present and needs met.      Therapy Documentation Precautions:  Precautions Precautions: Fall Precaution Comments: L inattention, R gaze preference, L hemiplegia, no dressing to L foot - wear post-op shoe OOB Required Braces or Orthoses: Other Brace Other Brace: dressing on L foot Restrictions Weight Bearing  Restrictions: No General:   Vital Signs: Therapy Vitals Temp: 98.3 F (36.8 C) Pulse Rate: 89 Resp: 18 BP: (!) 148/77 Patient Position (if appropriate): Sitting Oxygen Therapy SpO2: 100 % O2 Device: Room Air Pain:   Mobility:   Locomotion :    Trunk/Postural Assessment :    Balance:   Exercises:   Other Treatments:      Therapy/Group: Individual Therapy  Lauren [name] 02/18/2022, 3:53 PM

## 2022-02-18 NOTE — Progress Notes (Signed)
Occupational Therapy Discharge Summary  Patient Details  Name: Lauren [name] MRN: [id number] Date of Birth: [birth year]  Date of Discharge from [place] 19, 2023  Today's Date: 02/18/2022 OT Individual [phone] 1st Session,  1345-1445 2nd session  OT Individual Time Calculation (min): 60 min, 60 min    Patient has met 12 of 12 long term goals due to improved activity tolerance, improved balance, postural control, ability to compensate for deficits, functional use of  LEFT upper and LEFT lower extremity, improved attention, improved awareness, and improved coordination.  Patient to discharge at [facility] Assist level.  Patient's care partner is independent to provide the necessary physical and cognitive assistance at discharge.    Reasons goals not met: n/a   Recommendation:  Patient will benefit from ongoing skilled OT services in home health setting to continue to advance functional skills in the area of BADL and Reduce care partner burden.  Equipment: W/c, 1/2 lap tray, cushion, TTB, 3 in 1 commode, RW, walker splint for L UE, LHS   Reasons for discharge: treatment goals met  Patient/family agrees with progress made and goals achieved: Yes  OT Discharge Precautions/Restrictions  Precautions Precautions: Fall Precaution Comments: L inattention, R gaze preference, L hemiplegia, no dressing to L foot - wear post-op shoe OOB Required Braces or Orthoses: Splint/Cast Splint/Cast: L resting hand splint Restrictions Weight Bearing Restrictions: No General   Vital Signs Therapy Vitals Temp: 98.3 F (36.8 C) Pulse Rate: 89 Resp: 18 BP: (!) 148/77 Patient Position (if appropriate): Sitting Oxygen Therapy SpO2: 100 % O2 Device: Room Air Pain Pain Assessment Pain Scale: 0-10 Pain Score: 0-No pain Multiple Pain Sites: No PAINAD (Pain Assessment in Advanced Dementia) Breathing: normal ADL ADL Equipment Provided: Long-handled sponge Eating: Set up Where  Assessed-Eating: Chair Grooming: Setup Where Assessed-Grooming: Sitting at sink Upper Body Bathing: Supervision/safety Where Assessed-Upper Body Bathing: Sitting at sink, Shower Lower Body Bathing: Contact guard Where Assessed-Lower Body Bathing: Sitting at sink, Standing at sink, Administrator, sports Dressing: Contact guard Where Assessed-Upper Body Dressing: Sitting at sink Lower Body Dressing: Contact guard Where Assessed-Lower Body Dressing: Sitting at sink, Standing at sink Toileting: Setup Where Assessed-Toileting: Glass blower/designer: Therapist, music Method: Arts development officer: Bedside commode, Grab bars Tub/Shower Transfer: Metallurgist Method: Librarian, academic: Facilities manager: Curator Method: Radiographer, therapeutic: Radio broadcast assistant ADL Comments: Pt has improved significantly with all ADL's and mobility including L sided attention and awareness. With set up and cues, pt overall requires CGA to preform transfers in bathroom, UB/LB and grooming as well as toileting. Pt uses her L UE as a stabilizer as well as 1 handed techniques Pt continues to demonstrate impulsivity and requires CGA at all times with mobility and standing level activity. Husband, dtr, and sister have all been trained this visit and verbalized understanding and performed teach back of all training. Vision Baseline Vision/History: 1 Wears glasses Alignment/Gaze Preference: Gaze right Tracking/Visual Pursuits: Decreased smoothness of vertical tracking;Decreased smoothness of horizontal tracking Saccades: Additional eye shifts occurred during testing Convergence: Impaired (comment) Visual Fields: Left visual field deficit Depth Perception: Undershoots Additional Comments: L sided decreased attention but improved significantly since IE Perception  Perception:  Impaired Inattention/Neglect: Does not attend to left side of body;Does not attend to left visual field Praxis Praxis: Intact Cognition Cognition Overall Cognitive Status: Impaired/Different from baseline Arousal/Alertness: Awake/alert Orientation Level: Person;Place;Situation Person: Oriented Place: Oriented Situation:  Oriented Memory: Impaired Memory Impairment: Retrieval deficit;Decreased recall of new information;Decreased short term memory Decreased Short Term Memory: Functional basic Sustained Attention Impairment: Functional basic;Verbal basic Selective Attention: Impaired Selective Attention Impairment: Verbal basic;Functional basic Awareness: Impaired Awareness Impairment: Emergent impairment Sequencing: Impaired Sequencing Impairment: Functional basic Organizing: Impaired Organizing Impairment: Functional basic Decision Making: Impaired Decision Making Impairment: Functional basic;Verbal basic Behaviors: Impulsive;Poor frustration tolerance Safety/Judgment: Impaired Comments: impulsive Brief Interview for Mental Status (BIMS) Repetition of Three Words (First Attempt): 3 Temporal Orientation: Year: Correct Temporal Orientation: Month: Accurate within 5 days Temporal Orientation: Day: Correct Recall: "Sock": Yes, no cue required Recall: "Blue": Yes, no cue required Recall: "Bed": Yes, no cue required BIMS Summary Score: 15 Sensation Sensation Light Touch: Appears Intact Hot/Cold: Appears Intact Proprioception: Appears Intact Stereognosis: Impaired Detail Stereognosis Impaired Details: Impaired RUE Additional Comments: grossly diminished L LE compared to RLE as well as L UE Coordination Gross Motor Movements are Fluid and Coordinated: No Fine Motor Movements are Fluid and Coordinated: No Coordination and Movement Description: grossly uncoordinated due to L hemiplegia Finger Nose Finger Test: intact R UE, unable L UE 9 Hole Peg Test: n/t due to L  hemiplegia Motor  Motor Motor: Hemiplegia Motor - Skilled Clinical Observations: left hemiplegia with gross improvements in strength,coordination and mobility Mobility  Bed Mobility Bed Mobility: Rolling Right;Rolling Left;Supine to Sit;Sit to Supine Rolling Right: Independent with assistive device Rolling Left: Independent with assistive device Right Sidelying to Sit: Independent with assistive device Left Sidelying to Sit: Independent with assistive device Supine to Sit: Independent with assistive device Sit to Supine: Independent with assistive device Transfers Sit to Stand: Supervision/Verbal cueing Stand to Sit: Supervision/Verbal cueing  Trunk/Postural Assessment  Cervical Assessment Cervical Assessment: Within Functional Limits Thoracic Assessment Thoracic Assessment: Within Functional Limits Lumbar Assessment Lumbar Assessment: Exceptions to [facility] Postural Control Trunk Control: left hemi, left inattention Righting Reactions: delayed  Balance Balance Balance Assessed: Yes Static Sitting Balance Static Sitting - Balance Support: Feet supported;Right upper extremity supported Static Sitting - Level of Assistance: 6: Modified independent (Device/Increase time) Dynamic Sitting Balance Dynamic Sitting - Balance Support: Feet supported Dynamic Sitting - Balance Activities: Reaching for objects;Reaching across midline;Forward lean/weight shifting;Lateral lean/weight shifting Static Standing Balance Static Standing - Balance Support: During functional activity;Bilateral upper extremity supported Static Standing - Level of Assistance: 5: Stand by assistance Dynamic Standing Balance Dynamic Standing - Balance Support: Bilateral upper extremity supported Dynamic Standing - Level of Assistance: 4: Min assist Dynamic Standing - Balance Activities: Lateral lean/weight shifting;Reaching across midline;Reaching for objects Extremity/Trunk Assessment RUE Assessment RUE Assessment:  Within Functional Limits LUE Assessment LUE Assessment: Exceptions to [facility] General Strength Comments: 1/5 LUE Body System: Neuro Brunstrum levels for arm and hand: Arm;Hand Brunstrum level for arm: Stage II Synergy is developing Brunstrum level for hand: Stage II Synergy is developingTreatment and Interventions:  1st Session: Husband present for 1st part of Family Education focussed on L UE NMRE and use of Estim. Educated in weightbearing, L UE scanning, attentions strategies and positioning to enhance function and reduce risk of L sh subluxation. SaeboStim One applied to L sh and dist L wrist extensors throughout session. See below for response and parameters. Husband took notes on where to obtain and use for potential use with d/c to home. Integrated function with Estim including facilitation of gross grasp, threading his fingers of his L hand through pt's to elicit reach and performed 10 pegs in and out of holes in that manner with OT demo. Husband able to teach  back all strategies. At end of session, pt left up in w/c with husband present with chair exit alarm engaged, nurse call button and needs in place.    no adverse skin reactions.  330 pulse width 35 Hz pulse rate On 8 sec/ off 8 sec Ramp up/ down 2 sec Symmetrical Biphasic wave form   2nd Session:  Pt seen for full Family Education session for ADL and transfer training for shower, toillet and TTB training, UB/LB selc are, grooming and standing safety. Pt has improved significantly with all ADL's and mobility including L sided attention and awareness. With set up and cues, pt overall requires CGA to preform transfers in bathroom, UB/LB and grooming as well as toileting. Pt uses her L UE as a stabilizer as well as 1 handed techniques Pt continues to demonstrate impulsivity and requires CGA at all times with mobility and standing level activity. Husband, dtr, and sister have all been trained this visit and verbalized understanding and  performed teach back of all training. Left pt up in w/c at end of session with family bed side, chair exit alarm set, nurse call button and all needs in place.    [name] 02/18/2022, 4:47 PM

## 2022-02-18 NOTE — Plan of Care (Addendum)
  Problem: RH Balance Goal: LTG: Patient will maintain dynamic sitting balance (OT) Description: LTG:  Patient will maintain dynamic sitting balance with assistance during activities of daily living (OT) Outcome: Completed/Met Goal: LTG Patient will maintain dynamic standing with ADLs (OT) Description: LTG:  Patient will maintain dynamic standing balance with assist during activities of daily living (OT)  Outcome: Completed/Met   Problem: Sit to Stand Goal: LTG:  Patient will perform sit to stand in prep for activites of daily living with assistance level (OT) Description: LTG:  Patient will perform sit to stand in prep for activites of daily living with assistance level (OT) Outcome: Completed/Met   Problem: RH Eating Goal: LTG Patient will perform eating w/assist, cues/equip (OT) Description: LTG: Patient will perform eating with assist, with/without cues using equipment (OT) Outcome: Completed/Met   Problem: RH Grooming Goal: LTG Patient will perform grooming w/assist,cues/equip (OT) Description: LTG: Patient will perform grooming with assist, with/without cues using equipment (OT) Outcome: Completed/Met   Problem: RH Bathing Goal: LTG Patient will bathe all body parts with assist levels (OT) Description: LTG: Patient will bathe all body parts with assist levels (OT) Outcome: Completed/Met   Problem: RH Dressing Goal: LTG Patient will perform upper body dressing (OT) Description: LTG Patient will perform upper body dressing with assist, with/without cues (OT). Outcome: Completed/Met Goal: LTG Patient will perform lower body dressing w/assist (OT) Description: LTG: Patient will perform lower body dressing with assist, with/without cues in positioning using equipment (OT) Outcome: Completed/Met   Problem: RH Toileting Goal: LTG Patient will perform toileting task (3/3 steps) with assistance level (OT) Description: LTG: Patient will perform toileting task (3/3 steps) with  assistance level (OT)  Outcome: Completed/Met   Problem: RH Functional Use of Upper Extremity Goal: LTG Patient will use RT/LT upper extremity as a (OT) Description: LTG: Patient will use right/left upper extremity as a stabilizer/gross assist/diminished/nondominant/dominant level with assist, with/without cues during functional activity (OT) Outcome: Completed/Met   Problem: RH Toilet Transfers Goal: LTG Patient will perform toilet transfers w/assist (OT) Description: LTG: Patient will perform toilet transfers with assist, with/without cues using equipment (OT) Outcome: Completed/Met   Problem: RH Tub/Shower Transfers Goal: LTG Patient will perform tub/shower transfers w/assist (OT) Description: LTG: Patient will perform tub/shower transfers with assist, with/without cues using equipment (OT) Outcome: Completed/Met   Problem: RH Memory Goal: LTG Patient will demonstrate ability for day to day recall/carry over during activities of daily living with assistance level (OT) Description: LTG:  Patient will demonstrate ability for day to day recall/carry over during activities of daily living with assistance level (OT). Outcome: Completed/Met   Problem: RH Furniture Transfers Goal: LTG Patient will perform furniture transfers w/assist (OT/PT) Description: LTG: Patient will perform furniture transfers  with assistance (OT/PT). Outcome: Completed/Met

## 2022-02-18 NOTE — Group Note (Signed)
Patient Details Name: Gissele Narducci MRN: [id number] DOB: 01/16/1981 Today's Date: 02/18/2022 1105-12   Group Description: Stress management: Pt participated in group session with a focus on stress mgmt, education provided on healthy coping strategies, and social interaction. Focus of session on providing coping strategies to manage new diagnosis to allow for improved mental health to increase overall quality of life . Discussed how to break down stressors into "daily hassles," "major life stressors" and "life circumstances" in an effort to allow pts to chunk their stressors into groups and determine where to best put their efforts/time when dealing with stress. Provided active listening, emotional support and therapeutic use of self. Offered education on factors that protect [name] against stress such as "daily uplifts," "healthy coping strategies" and "protective factors." Encouraged all group members to make an effort to actively recall one event from their day that was a daily uplift in an effort to protect their mindset from stressors as well as sharing this information with their caregivers to facilitate improved caregiver communication and decrease overall burden of care.  Issued pt handouts on healthy coping strategies to implement into routine.   Individual level documentation: Patient participated with moderate collaboration during session.   Pain: no c/o     Brandee Markin 02/18/2022, 3:09 PM

## 2022-02-18 NOTE — Progress Notes (Signed)
Speech Language Pathology Discharge Summary  Patient Details  Name: Lauren [name] MRN: [id number] Date of Birth: 1980-12-10  Date of Discharge from [place] 19, 2023  Today's Date: 02/18/2022 SLP Individual Time: 1255-1330 SLP Individual Time Calculation (min): 35 min  Skilled Therapeutic Interventions:  Skilled treatment session focused on completion of family education with the patient, her husband, sister, and daughter. SLP facilitated session by providing strategies to utilize at home to maximize attention, recall, left visual scanning and overall safety at home including the importance of 24 hour supervision. All family members verbalized understanding and SLP educated on establishing a routine at home as well as generated a list of activities patient can participate in safely at home to maximize cognitive recovery. Handouts were given to reinforce information. Patient left upright in wheelchiar with alarm on and all needs within reach.  Patient has met 7 of 7 long term goals.  Patient to discharge at overall Min;Mod level.   Reasons goals not met: N/A   Clinical Impression/Discharge Summary: Patient has made functional gains and has met 7 of 7 LTGs this admission. Currently, patient is consuming regular textures with thin liquids with minimal overt s/s of aspiration and is overall Mod I for use of swallowing compensatory strategies. Patient demonstrates improvement in overall cognitive functioning and requires Min-Mod A verbal and visual cues to complete functional and familiar tasks safely in regards to selective attention, functional problem solving, attention to left visual field, emergent awareness and recall with use of compensatory strategies. Patient's safety is also intermittently impacted by impulsivity. Patient and family education is complete and patient will discharge home with 24 hour supervision from family. Patient would benefit from f/u SLP services to maximize her  cognitive functioning and overall functional independence in order to reduce caregiver burden.   Care Partner:  Caregiver Able to Provide Assistance: Yes  Type of Caregiver Assistance: Physical;Cognitive  Recommendation:  24 hour supervision/assistance;Home Health SLP  Rationale for SLP Follow Up: Maximize cognitive function and independence;Reduce caregiver burden;Maximize swallowing safety   Equipment: N/A   Reasons for discharge: Discharged from hospital;Treatment goals met   Patient/Family Agrees with Progress Made and Goals Achieved: Yes    Suhailah Kwan 02/18/2022, 6:30 AM

## 2022-02-18 NOTE — Progress Notes (Signed)
PROGRESS NOTE    Lauren [name]  MWU:[id number] DOB: Feb 26, 1981 DOA: 01/28/2022 PCP: Lemmie Evens, MD   Brief Narrative:  Consult from inpatient rehab for chronic comorbid conditions including CKD 4, diabetes type 1, combined systolic and diastolic heart failure anemia of chronic disease hypertension.  See initial consult note 02/17/2022 for further details.  Briefly patient appears well, no overt signs of volume overload, labs and vital signs today appear to be within normal limits.  Patient's creatinine appears to be at baseline given her CKD 4 with GFR in the 20-40 range, currently in the 30s.  Patient denies any orthopnea or dyspnea with exertion.  Continues to tolerate therapy quite well.  We will continue to follow along as indicated.  Continue labs weekly unless otherwise indicated.  Patient mentioned discharge in the next 24 to 48 hours per discussion with inpatient rehab which would certainly be reasonable from our standpoint.  Would recommend close follow-up outpatient with PCP, cardiology as well as nephrology in the interim.  Patient would like to establish care with new physicians in her area in [place].   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 ([place])   Code Status: DNR Family Communication: Husband at bedside  Status is: Per primary  Dispo: The patient is from: Home              Anticipated d/c is to: Per primary              Anticipated d/c date is: Per primary  Consultants:  We are  Procedures:  None  Antimicrobials:  None  Subjective: No acute  issues or events overnight  Objective: Vitals:   02/17/22 2025 02/17/22 2114 02/17/22 2117 02/18/22 0424  BP: (!) 160/84 (!) 148/81 (!) 148/81 (!) 139/90  Pulse: 93 (!) 103 (!) 103 84  Resp: 18  16 16   Temp: 98.1 F (36.7 C)  98 F (36.7 C) 98.1 F (36.7 C)  TempSrc:    Oral  SpO2: 100%  100% 100%  Weight:        Intake/Output Summary (Last 24 hours) at 02/18/2022 0758 Last data filed at 02/17/2022 1850 Gross per 24 hour  Intake 600 ml  Output --  Net 600 ml   Filed Weights   02/14/22 0502 02/15/22 0503 02/17/22 0500  Weight: 85.5 kg 84.5 kg 84.5 kg    Examination:  General:  Pleasantly resting in bed, No acute distress. HEENT:  Normocephalic atraumatic.  Sclerae nonicteric, noninjected.  Extraocular movements intact bilaterally. Neck:  Without mass or deformity.  Trachea is midline. Lungs:  Clear to auscultate bilaterally without rhonchi, wheeze, or rales. Heart:  Regular rate and rhythm.  Without murmurs, rubs, or gallops. Abdomen:  Soft, nontender, nondistended.  Without guarding or rebound. Extremities: Left upper extremity limited range of motion and strength, currently undergoing PT evaluation, left foot boot clean dry intact  Data Reviewed: I have personally reviewed following labs and imaging studies  CBC: Recent Labs  Lab 02/15/22 0551  WBC 9.2  HGB 9.3*  HCT 28.9*  MCV 85.8  PLT 435*   Basic  Metabolic Panel: Recent Labs  Lab 02/15/22 0551 02/17/22 0551  NA 133* 139  K 4.4 3.7  CL 99 103  CO2 23 25  GLUCOSE 395* 88  BUN 39* 38*  CREATININE 2.47* 2.53*  CALCIUM 8.6* 9.0   GFR: Estimated Creatinine Clearance: 32 mL/min (A) (by C-G formula based on SCr of 2.53 mg/dL (H)). Liver Function Tests: No results for input(s): "AST", "ALT", "ALKPHOS", "BILITOT", "PROT", "ALBUMIN" in the last 168 hours. No results for input(s): "LIPASE", "AMYLASE" in the last 168 hours. No results for input(s): "AMMONIA" in the last 168 hours. Coagulation  Profile: No results for input(s): "INR", "PROTIME" in the last 168 hours. Cardiac Enzymes: No results for input(s): "CKTOTAL", "CKMB", "CKMBINDEX", "TROPONINI" in the last 168 hours. BNP (last 3 results) No results for input(s): "PROBNP" in the last 8760 hours. HbA1C: No results for input(s): "HGBA1C" in the last 72 hours. CBG: Recent Labs  Lab 02/17/22 0651 02/17/22 1217 02/17/22 1707 02/17/22 2120 02/18/22 0656  GLUCAP 79 352* 276* 218* 134*   Lipid Profile: No results for input(s): "CHOL", "HDL", "LDLCALC", "TRIG", "CHOLHDL", "LDLDIRECT" in the last 72 hours. Thyroid Function Tests: No results for input(s): "TSH", "T4TOTAL", "FREET4", "T3FREE", "THYROIDAB" in the last 72 hours. Anemia Panel: No results for input(s): "VITAMINB12", "FOLATE", "FERRITIN", "TIBC", "IRON", "RETICCTPCT" in the last 72 hours. Sepsis Labs: No results for input(s): "PROCALCITON", "LATICACIDVEN" in the last 168 hours.  No results found for this or any previous visit (from the past 240 hour(s)).       Radiology Studies: No results found.      Scheduled Meds:  atorvastatin  40 mg Oral Daily   enoxaparin (LOVENOX) injection  120 mg Subcutaneous Q24H   insulin aspart  0-20 Units Subcutaneous TID WC   insulin aspart  0-5 Units Subcutaneous QHS   insulin aspart  12 Units Subcutaneous TID WC   insulin detemir  27 Units Subcutaneous BID   isosorbide mononitrate  20 mg Oral BID   levothyroxine  125 mcg Oral Q0600   magnesium gluconate  250 mg Oral QHS   metoprolol tartrate  37.5 mg Oral BID   multivitamin with minerals  1 tablet Oral Daily   nicotine  21 mg Transdermal Daily   pantoprazole  40 mg Oral Daily   ramelteon  8 mg Oral QHS   ranolazine  500 mg Oral BID   sodium chloride flush  10-40 mL Intracatheter Q12H   sodium chloride flush  3 mL Intravenous Q12H   ticagrelor  90 mg Oral BID   tiZANidine  4 mg Oral QHS   traZODone  50 mg Oral QHS   Continuous Infusions:  sodium chloride       LOS: 21 days   Time spent: 25min  Jazel Nimmons C Ginelle Bays, DO [facility]  If 7PM-7AM, please contact night-coverage [email or web address]  02/18/2022, 7:58 AM

## 2022-02-18 NOTE — Progress Notes (Signed)
Patient ID: Lauren [name], female   DOB: 05-07-1980, 41 y.o.   MRN: [id number] Met with husband who is here for education to ask if has gotten the rolling walker and tub bench which are not covered by insurance. He reports he has, other equipment should be coming to room later today. Husband and daughter will be here for afternoon therapies in preparation for discharge tomorrow. Gave FMLA and STD forms to [employer] for completion.

## 2022-02-18 NOTE — Group Note (Deleted)
Patient Details Name: Lauren [name] MRN: [id number] DOB: 05-Oct-1980 Today's Date: 02/18/2022  Time Calculation:        Group Description: Stress management: Pt participated in group session with a focus on stress mgmt, education provided on healthy coping strategies, and social interaction. Focus of session on providing coping strategies to manage new diagnosis to allow for improved mental health to increase overall quality of life . Discussed how to break down stressors into "daily hassles," "major life stressors" and "life circumstances" in an effort to allow pts to chunk their stressors into groups and determine where to best put their efforts/time when dealing with stress. Provided active listening, emotional support and therapeutic use of self. Offered education on factors that protect [name] against stress such as "daily uplifts," "healthy coping strategies" and "protective factors." Encouraged all group members to make an effort to actively recall one event from their day that was a daily uplift in an effort to protect their mindset from stressors as well as sharing this information with their caregivers to facilitate improved caregiver communication and decrease overall burden of care.  Issued pt handouts on healthy coping strategies to implement into routine.   Individual level documentation: Patient participated with {Patient Participation:[id number]} collaboration during session.   Pain:    Precautions:    Corinne Ports [facility] 02/18/2022, 3:53 PM

## 2022-02-18 NOTE — Progress Notes (Signed)
PROGRESS NOTE   Subjective/Complaints:  Pt reports nasuea gone- no issues- slept much better last night with trazodone- asking for something "less addictive than Oxy" to take for pain- will try Tramadol 50 mg 3x/day prn  ROS:    Pt denies SOB, abd pain, CP, N/V/C/D, and vision changes     Objective:   No results found.  No results for input(s): "WBC", "HGB", "HCT", "PLT" in the last 72 hours.  Recent Labs    02/17/22 0551  NA 139  K 3.7  CL 103  CO2 25  GLUCOSE 88  BUN 38*  CREATININE 2.53*  CALCIUM 9.0    Intake/Output Summary (Last 24 hours) at 02/18/2022 1838 Last data filed at 02/18/2022 1751 Gross per 24 hour  Intake 1100 ml  Output --  Net 1100 ml         Physical Exam: Vital Signs Blood pressure (!) 148/77, pulse 89, temperature 98.3 F (36.8 C), resp. rate 18, weight 84.5 kg, last menstrual period 11/01/2021, SpO2 100 %.       General: awake, alert, appropriate, sitting EOB, denies Nausea today; NAD HENT: conjugate gaze; oropharynx moist CV: regular rate; no JVD Pulmonary: CTA B/L; no W/R/R- good air movement GI: soft, NT, ND, (+)BS Psychiatric: appropriate- still flat Neurological: alert- more interactive L foot with dressing wrapped in ace wrap- C/D/I Tenting in hands B/L   PE from prior encounter: Skin: + L 5th toe amputation; wound site open with mild serous drainage, clean wound bed, no erythema or warmth.  Seen wrapped up in ACE wrap- C/D/I MSK:      No apparent deformity.      Strength:                RUE: 5/5 SA, 5/5 EF, 5/5 EE, 5/5 WE, 5/5 FF, 5/5 FA                 LUE: flaccid, 0/5 throughout                RLE: 5/5 HF, 5/5 KE, 5/5 DF, 5/5 EHL, 5/5 PF                 LLE:  3/5 HF, 2/5 KE, 0/5 DF, 0/5 EHL, 1/5 PF    Neurologic exam:  Cognition: AAO to person, place, time and event.  Language: Fluent, No substitutions or neoglisms.moderate dysarthria Mood: Pleasant  affect, appropriate mood.  Sensation: To light touch intact in BL UEs and LEs  CN: Left facial droop   Assessment/Plan: 1. Functional deficits which require 3+ hours per day of interdisciplinary therapy in a comprehensive inpatient rehab setting. Physiatrist is providing close team supervision and 24 hour management of active medical problems listed below. Physiatrist and rehab team continue to assess barriers to discharge/monitor patient progress toward functional and medical goals  Care Tool:  Bathing    Body parts bathed by patient: Right arm, Left arm, Chest, Abdomen, Buttocks, Right upper leg, Left upper leg, Face, Front perineal area, Right lower leg, Left lower leg   Body parts bathed by helper: Front perineal area, Right lower leg, Left lower leg Body parts n/a: Left lower leg  Bathing assist Assist Level: Contact Guard/Touching assist     Upper Body Dressing/Undressing Upper body dressing   What is the patient wearing?: Bra, Pull over shirt    Upper body assist Assist Level: Contact Guard/Touching assist    Lower Body Dressing/Undressing Lower body dressing      What is the patient wearing?: Incontinence brief, Pants     Lower body assist Assist for lower body dressing: Contact Guard/Touching assist     Toileting Toileting Toileting Activity did not occur (Clothing management and hygiene only): N/A (no void or bm)  Toileting assist Assist for toileting: Contact Guard/Touching assist     Transfers Chair/bed transfer  Transfers assist     Chair/bed transfer assist level: Contact Guard/Touching assist     Locomotion Ambulation   Ambulation assist   Ambulation activity did not occur: Safety/medical concerns  Assist level: Minimal Assistance - Patient > 75% Assistive device: Walker-rolling Max distance: 30 ft   Walk 10 feet activity   Assist  Walk 10 feet activity did not occur: Safety/medical concerns  Assist level: Minimal Assistance -  Patient > 75% Assistive device: Walker-rolling   Walk 50 feet activity   Assist Walk 50 feet with 2 turns activity did not occur: Safety/medical concerns  Assist level: Minimal Assistance - Patient > 75% Assistive device: Walker-rolling    Walk 150 feet activity   Assist Walk 150 feet activity did not occur: Safety/medical concerns (unable to perform due to strength and activity tolerance deficits)         Walk 10 feet on uneven surface  activity   Assist Walk 10 feet on uneven surfaces activity did not occur: Safety/medical concerns (unable to perform due to strength and activity tolerance deficits)         Wheelchair     Assist Is the patient using a wheelchair?: Yes Type of Wheelchair: Manual    Wheelchair assist level: Total Assistance - Patient < 25% Max wheelchair distance: 300 ft    Wheelchair 50 feet with 2 turns activity    Assist        Assist Level: Total Assistance - Patient < 25%   Wheelchair 150 feet activity     Assist      Assist Level: Total Assistance - Patient < 25%   Blood pressure (!) 148/77, pulse 89, temperature 98.3 F (36.8 C), resp. rate 18, weight 84.5 kg, last menstrual period 11/01/2021, SpO2 100 %.  Medical Problem List and Plan: 1. 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             -patient may shower with lines and foot wound covered             -ELOS/Goals: 14-21 days  WBAT On LLE D/c 10/20 Con't CIR- d/c tomorrow 2.  Antithrombotics: -DVT/anticoagulation:  Pharmaceutical: Lovenox             -antiplatelet therapy: Brilinta 3. Pain Management: continue Tylenol, oxycodone as needed. Left arm sling ordered.   10/19- will try tramadol 50 mg q6 hours prn- use max 3x/day with Cr- since wants something less than Oxy 4. Insomnia: LCSW to evaluate and provide  support             Replace tizanidine with ramelteon and magnesium supplement at 7pm when patient likes to sleep             -  antipsychotic agents: n/a 5. Neuropsych/cognition: This patient is capable of making decisions on her own behalf. 6. 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 Fluids/Electrolytes/Nutrition: Strict Is and Os and follow-up chemistries             -dys 3/thin liquids; SLP eval             -repeat mag/phos tomorrow 8: Right ICA dissection with stent placement: on Brilinta 9: Hypertension: monitor             -continue hydralazine 12.5 mg TID             -continue isosorbide 20 mg BID             -continue Lopressor 37.5 mg BID  10/2- BP is controlled- con't regimen  10/3- had BP of 80s/50s yesterday- hydralazine stopped- is better this AM- will see how does in therapy with standing- concern will drop  10/4- will decrease Metoprolol to 25 mg BID- not change to succinate, since won't have ability to change meds as often.   10/5- BP overnight 110s/120s supine- will see how tolerates therapy today.   10/6- BP doing better, they increased her B blocker back to 37.5 mg BID   10/13 well controlled, continue to monitor  10/16- BP very slightly elevated, but overall controlled- con't regimen  10/17- BP doing great this Am- con't regimen  10/18- BP labile- 161-096 Systolic in last 24 hours-   10/19- BP doing better- con't regimen    02/18/2022    1:51 PM 02/18/2022    4:24 AM 02/17/2022    9:17 PM  Vitals with BMI  Systolic [phone]  Diastolic 77 90 81  Pulse 89 84 103    10: Hyperlipidemia: continue Lipitor 40 mg daily 11: Coagulopathy s/p RUE embolectomy 10/2021: Eliquis PTA now on Lovenox; f                -follow-up hematology as outpt 12: COPD exacerbation: hypoxia resolved; continue bronchodilators/nebs 13: Chronic diastolic and systolic heart failure:             -strict Is and Os and daily  weight             -no diuretics currently             -see meds above   Filed Weights   02/14/22 0502 02/15/22 0503 02/17/22 0500  Weight: 85.5 kg 84.5 kg 84.5 kg   10/5- will start Lasix 10 mg daily due to vascualar congestion on CXR  10/6- Per Cards, needs 40 mg daily- will change  10/7 weight appears stable, monitor  10/14: weight is up 2kg, add additional 20mg  lasix today  10/16- will decrease lasix to 20 mg daily since Cr up significantly to 2.46 this AM- that's the highest it's been- usually/lately 2.1 and BUN is 39- will recheck Wednesday.   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   10/3- ate breakfast tray this AM- BG's running from 56 (low yesterday) to 312 yesterday due to foods form home- will ask DM coordinator to help try and manage.   10/4- CBG's mid 100s to mid 200s- a little better in last 24 hours- no hypoglycemia  10/6- Dm coordinator - increased meals novolog 5 units TID with meals and night/QHS 0-5 SSI  10/7 increase Novolog to 7 units TID  10/8 CBGs elevated last night, a little better today, continue monitor response to insulin adjustment  10/10 increase levemir to 27u  10/11 DM coordinator recommending 10 U novolog TID meal coverage and 25 U levemir BID, will make these changes   10/13 well controlled overall, continue current regimen  10/16- still eating food from home- BG's 168-388 this AM- if stays up, will call DM coordinator back  10/17- BG's much better  today- 138-228- con't regimen-   10/18- is eating from home- 79-414 in last 24 hours- but except for 414, was doing better- give 1 more day- and then call DM educator/coordinator back for recs.   10/19- using insulin pump- actually, BG"s doing better- will con't 17: Class 2 obesity: dietary counseling 18: Tobacco abuse: cessation counseling; continue nicotine patch 19: AKI atop CKD IV: trend BMP  -9/29 Cr improved to 1.88  10/2- Down to 1.59 and BUN 24- will push fluids and recheck in AM  10/3- Cr 1.64 and BUN 24- basically stable- will recheck Thursday-   10/4- ordered BMP for tomorrow  10/5- Cr 2.12 up from 1.64- but required Lasix 40 mg PO x1- actually will start lasix 10 mg daily due to pulm/vascular congestion-   10/6- CR usually runs ~ 2 per Cards note-   -10/10 CR stable at 2.16  10/16- Cr up to 2.46 and BUN 39- will decrease Lasix to 20 mg daily to see if helps; recheck Wednesday  10/18- Cr up very slightly to 2.53 and BUN stable at 38- no increased resp Sx's/ and sounds similar on exam-   10/19- per IM, is in her range- will be safe to d/c home with close f/u by renal, cards and needs new PCP.  20: CAD: stable; treated medically; Dr. Harl Bowie 21: Left foot osteomyelitis s/p ray amputation [name] -recent bone biopsy; cultures neg - Husband to bring in ?CAM boot for weightbearing, protection - Remove IJ order - 9/30 Noted surgical site dehiscence with PT ambulation, was not wearing orthotic. Podiatry consulted, imaging showing mildly angulated fracture of the distal fourth metatarsal (likely iatrogenic from prior amputation)               - Orders for betadyne dressing daily               - NWB LLE               - Appreciate podiatry recs, to discuss 4th MT fx as group next week  10/2- Podiatry says once has CAM boot to be worn- either from home or for [name] to order- d/w PA-  and will check in to it. Supposedly had a dehiscence but podiatry to see again today.   10/3- podiatry said  they'd come back for possible bedside debridement- can WBAT now with CAM boot  10/11 Seen by  Podiatry for L foot ulcer,  Iodosorb and gauze dressings daily, WBAT LLE in surgical shoe 22: Leukocytosis: trending down; follow-up CBC  -WBC down to 7/1 on 10/9  10/18- WBC 9.2 23. Dysphagia  -Advanced to heart healthy/carb modified/ thin liquids            - 9/30 - family requesting diet liberalization as patient primarily eating foods brought in from outside d/t hospital food bland; carb modified diet unable to be ordered with less restrictive parameters. Dietary consulted.   24. RSV  10/2- will take off Droplet precautions- no more Sx's.  25. Chest pain- in setting of severe cardiac issues including CHF with EF 35-40%, CAD- acute on chronic CHF  10/5- CP last night- required NTG x2- EKG had no changes- troponins elevated probably due to pulmonary congestion which was also found- will transfuse 1 unit to help CP; also restart her home Protonix for hx of reflux.  10/10 CP last night, resolved with NTG, troponins not increased above baseline 26, ABLA  10/5- will transfuse 1 unit- think the Hb 7.8 that came back 5 hours later is likely due to hemoconcentration, and isn't real, since her Hb has been trending down for days. On Lovenox and Brillenta- check Fecal occult for blood loss.   HGB stable at 8.8 on 10/9   10/18- Hb 9.3- improving 27. Insomnia  10/5- will increase trazodone to 75 mg QHS per pt request  10/6- slepeing better per pt.   10/12 Increase trazodone to 100mg  QHS  10/18- pt's trazodone not on list- not clear why/when d/c'd- will restart at 50 mg QHS- since didn't sleep at all last night 28. Constipation  10/17- had enema last night and resolved for now- having bowel /constipation frequently since Stroke- will monitor closely.    Will call IM consult since having so many issues with Kidneys/CHF and Won't eat diet here- only home food, so BG's are out of control.    I spent a total of   37  minutes on total care today- >50% coordination of care- due to d/w PA x2 about pain meds and Insulin pump- pt insisted to use insulin pump from home and refuses hospital insulin. BG's looking better, so it's OK- suggested by IM .   LOS: 21 days A FACE TO FACE EVALUATION WAS PERFORMED  Krisann Mckenna 02/18/2022, 6:38 PM

## 2022-02-18 NOTE — Group Note (Signed)
Patient Details Name: Lauren [name] MRN: [id number] DOB: 10-Jun-1980 Today's Date: 02/18/2022  Time Calculation: OT Group Time Calculation OT Group Start Time: 1105 OT Group Stop Time: 2458 OT Group Time Calculation (min): 60 min      Group Description: Stress management: Pt participated in group session with a focus on stress mgmt, education provided on healthy coping strategies, and social interaction. Focus of session on providing coping strategies to manage new diagnosis to allow for improved mental health to increase overall quality of life . Discussed how to break down stressors into "daily hassles," "major life stressors" and "life circumstances" in an effort to allow pts to chunk their stressors into groups and determine where to best put their efforts/time when dealing with stress. Provided active listening, emotional support and therapeutic use of self. Offered education on factors that protect [name] against stress such as "daily uplifts," "healthy coping strategies" and "protective factors." Encouraged all group members to make an effort to actively recall one event from their day that was a daily uplift in an effort to protect their mindset from stressors as well as sharing this information with their caregivers to facilitate improved caregiver communication and decrease overall burden of care.  Issued pt handouts on healthy coping strategies to implement into routine.   Individual level documentation: Patient participated with fair collaboration during session.   Pain: No pain      [name] 02/18/2022, 4:02 PM

## 2022-02-18 NOTE — Inpatient Diabetes Management (Addendum)
Inpatient Diabetes Program Recommendations  AACE/ADA: New Consensus Statement on Inpatient Glycemic Control (2015)  Target Ranges:  Prepandial:   less than 140 mg/dL      Peak postprandial:   less than 180 mg/dL (1-2 hours)      Critically ill patients:  140 - 180 mg/dL   Lab Results  Component Value Date   GLUCAP 134 (H) 02/18/2022   HGBA1C 8.0 (H) 01/09/2022    Review of Glycemic Control  Latest Reference Range & Units 02/17/22 06:51 02/17/22 12:17 02/17/22 17:07 02/17/22 21:20 02/18/22 06:56  Glucose-Capillary 70 - 99 mg/dL 79 352 (H) 276 (H) 218 (H) 134 (H)   DM1  (does not make insulin.  Needs correction, basal and meal coverage) Outpatient Diabetes medications:  Per Whitney with [facility] end[facility]-last settings on 9/10  Basal rate 12a-12p = 0.9 units/hr--total 24 hr basal 21.6 units Insulin: Carb ratio= 1 unit/10 g  Correction Sensitivity= 38 mg/dL  Target BG= 120  Active time= 4 hrs    Current orders for Inpatient glycemic control:  Novolog 0-20 units tid + hs   Inpatient Diabetes Program Recommendations:     Noted consult. Per progress note insulin pump reapplied. Secure chat sent to RN to verify.   At this time consider: -Discontinuing Novolog 0-20 units TID & HS -Add insulin pump order set for TID & HS & 0200.   Spoke with patient and husband regarding plan for outpatient diabetes management.  Reviewed previous insulin pump settings prior to admission compared with inpatient insulin needs. Patient is appropriate and husband can also assist to make insulin pump changes if necessary. Current trends acceptable and reviewed target goals. Patient is not interested in changing insulin pump settings today.  Encouraged to reach to Abbe Amsterdam, NP outpatient endocrinology today to set up appointment and reviewed when to call for updated insulin pump settings.  Will continue to follow trends. Plan for discharge 10/20. Secure chat sent to PA.  Thanks, Bronson Curb,  MSN, RNC-OB Diabetes Coordinator [phone] (8a-5p)

## 2022-02-19 ENCOUNTER — Other Ambulatory Visit (HOSPITAL_COMMUNITY): Payer: Self-pay

## 2022-02-19 ENCOUNTER — Ambulatory Visit: Payer: No Typology Code available for payment source | Admitting: Student

## 2022-02-19 LAB — GLUCOSE, CAPILLARY: Glucose-Capillary: 224 mg/dL — ABNORMAL HIGH (ref 70–99)

## 2022-02-19 MED ORDER — RAMELTEON 8 MG PO TABS: 8.0000 mg | ORAL_TABLET | Freq: Every day | ORAL | 0 refills | Status: DC

## 2022-02-19 NOTE — Progress Notes (Signed)
Inpatient Rehabilitation Discharge Medication Review by a Pharmacist  A complete drug regimen review was completed for this patient to identify any potential clinically significant medication issues.  High Risk Drug Classes Is patient taking? Indication by Medication  Antipsychotic No   Anticoagulant Yes Coagulopathy: Lovenox subQ injections  Antibiotic No   Opioid Yes Tramadol - pain  Antiplatelet Yes Ticagrelor- stroke prophylaxis  Hypoglycemics/insulin Yes Insulin pump- diabetes  Vasoactive Medication Yes CAD, Hypertension, heart failure, angina: Lopressor Imdur, Nitroglycerin, ranolazine  Chemotherapy No   Other Yes Pain: Tylenol Sleep: ramelteon, magnesium, Trazodone, tizanidine Hyperlipidemia: Lipitor Muscle spasms: Robaxin, tizanidine Hypothyroid: levothyroxine Nicotine dependence: Nicoderm Reflux: protonix MVI    Type of Medication Issue Identified Description of Issue Recommendation(s)  Drug Interaction(s) (clinically significant)     Duplicate Therapy     Allergy     No Medication Administration End Date     Incorrect Dose     Additional Drug Therapy Needed     Significant med changes from prior encounter (inform family/care partners about these prior to discharge).    Other       Clinically significant medication issues were identified that warrant physician communication and completion of prescribed/recommended actions by midnight of the next day:  No  Name of provider notified for urgent issues identified:   Provider Method of Notification:     Pharmacist comments:   Time spent performing this drug regimen review (minutes):  30   Thank you for allowing [name] to participate in this patients care. Jens Som, PharmD 02/19/2022 12:01 PM  **Pharmacist phone directory can be found on [email or web address] listed under [place]**

## 2022-02-19 NOTE — Progress Notes (Signed)
Took pictures of wounds to left foot. Changed dressing to left foot while family there. Notified PA that patient is ready. Called TOC for meds.  NT getting patient ready for discharge.

## 2022-02-19 NOTE — Progress Notes (Signed)
PROGRESS NOTE   Subjective/Complaints:  Want sot go home with tramadol only.  Didn't sleep at all last night.     ROS:   Pt denies SOB, abd pain, CP, N/V/C/D, and vision changes     Objective:   No results found.  No results for input(s): "WBC", "HGB", "HCT", "PLT" in the last 72 hours.  Recent Labs    02/17/22 0551  NA 139  K 3.7  CL 103  CO2 25  GLUCOSE 88  BUN 38*  CREATININE 2.53*  CALCIUM 9.0    Intake/Output Summary (Last 24 hours) at 02/19/2022 0917 Last data filed at 02/19/2022 0807 Gross per 24 hour  Intake 760 ml  Output --  Net 760 ml         Physical Exam: Vital Signs Blood pressure 122/71, pulse 89, temperature 98.7 F (37.1 C), resp. rate 18, weight 90.3 kg, last menstrual period 11/01/2021, SpO2 95 %.        General: awake, alert, appropriate, sitting up in bed; looks tired; NAD HENT: conjugate gaze; oropharynx moist CV: regular rate; no JVD Pulmonary: CTA B/L; no W/R/R- good air movement GI: soft, NT, ND, (+)BS Psychiatric: appropriate/flat Neurological: alert- more interactive L foot with dressing wrapped in ace wrap- C/D/I Tenting in hands B/L   PE from prior encounter: Skin: + L 5th toe amputation; wound site open with mild serous drainage, clean wound bed, no erythema or warmth.  Seen wrapped up in ACE wrap- C/D/I MSK:      No apparent deformity.      Strength:                RUE: 5/5 SA, 5/5 EF, 5/5 EE, 5/5 WE, 5/5 FF, 5/5 FA                 LUE: flaccid, 0/5 throughout                RLE: 5/5 HF, 5/5 KE, 5/5 DF, 5/5 EHL, 5/5 PF                 LLE:  3/5 HF, 2/5 KE, 0/5 DF, 0/5 EHL, 1/5 PF    Neurologic exam:  Cognition: AAO to person, place, time and event.  Language: Fluent, No substitutions or neoglisms.moderate dysarthria Mood: Pleasant affect, appropriate mood.  Sensation: To light touch intact in BL UEs and LEs  CN: Left facial  droop   Assessment/Plan: 1. Functional deficits which require 3+ hours per day of interdisciplinary therapy in a comprehensive inpatient rehab setting. Physiatrist is providing close team supervision and 24 hour management of active medical problems listed below. Physiatrist and rehab team continue to assess barriers to discharge/monitor patient progress toward functional and medical goals  Care Tool:  Bathing    Body parts bathed by patient: Right arm, Left arm, Chest, Abdomen, Buttocks, Right upper leg, Left upper leg, Face, Front perineal area, Right lower leg, Left lower leg   Body parts bathed by helper: Front perineal area, Right lower leg, Left lower leg Body parts n/a: Left lower leg   Bathing assist Assist Level: Contact Guard/Touching assist     Upper Body Dressing/Undressing Upper body  dressing   What is the patient wearing?: Bra, Pull over shirt    Upper body assist Assist Level: Contact Guard/Touching assist    Lower Body Dressing/Undressing Lower body dressing      What is the patient wearing?: Incontinence brief, Pants     Lower body assist Assist for lower body dressing: Contact Guard/Touching assist     Toileting Toileting Toileting Activity did not occur (Clothing management and hygiene only): N/A (no void or bm)  Toileting assist Assist for toileting: Contact Guard/Touching assist     Transfers Chair/bed transfer  Transfers assist     Chair/bed transfer assist level: Contact Guard/Touching assist     Locomotion Ambulation   Ambulation assist   Ambulation activity did not occur: Safety/medical concerns  Assist level: Minimal Assistance - Patient > 75% Assistive device: Walker-rolling Max distance: 30 ft   Walk 10 feet activity   Assist  Walk 10 feet activity did not occur: Safety/medical concerns  Assist level: Minimal Assistance - Patient > 75% Assistive device: Walker-rolling   Walk 50 feet activity   Assist Walk 50 feet  with 2 turns activity did not occur: Safety/medical concerns  Assist level: Minimal Assistance - Patient > 75% Assistive device: Walker-rolling    Walk 150 feet activity   Assist Walk 150 feet activity did not occur: Safety/medical concerns (unable to perform due to strength and activity tolerance deficits)         Walk 10 feet on uneven surface  activity   Assist Walk 10 feet on uneven surfaces activity did not occur: Safety/medical concerns (unable to perform due to strength and activity tolerance deficits)         Wheelchair     Assist Is the patient using a wheelchair?: Yes Type of Wheelchair: Manual    Wheelchair assist level: Total Assistance - Patient < 25% Max wheelchair distance: 300 ft    Wheelchair 50 feet with 2 turns activity    Assist        Assist Level: Total Assistance - Patient < 25%   Wheelchair 150 feet activity     Assist      Assist Level: Total Assistance - Patient < 25%   Blood pressure 122/71, pulse 89, temperature 98.7 F (37.1 C), resp. rate 18, weight 90.3 kg, last menstrual period 11/01/2021, SpO2 95 %.  Medical Problem List and Plan: 1. 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             -patient may shower with lines and foot wound covered             -ELOS/Goals: 14-21 days  WBAT On LLE D/c 10/20 D/c today 2.  Antithrombotics: -DVT/anticoagulation:  Pharmaceutical: Lovenox             -antiplatelet therapy: Brilinta 3. Pain Management: continue Tylenol, oxycodone as needed. Left arm sling ordered.   10/19- will try tramadol 50 mg q6 hours prn- use max 3x/day with Cr- since wants something less than Oxy  10/20- wants to go home with Tramadol not Oxy 4. Insomnia: LCSW to evaluate and provide support             Replace tizanidine with ramelteon and magnesium  supplement at 7pm when patient likes to sleep             -antipsychotic agents: n/a 5. Neuropsych/cognition: This patient is capable  of making decisions on her own behalf. 6. 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 Fluids/Electrolytes/Nutrition: Strict Is and Os and follow-up chemistries             -dys 3/thin liquids; SLP eval             -repeat mag/phos tomorrow 8: Right ICA dissection with stent placement: on Brilinta 9: Hypertension: monitor             -continue hydralazine 12.5 mg TID             -continue isosorbide 20 mg BID             -continue Lopressor 37.5 mg BID  10/2- BP is controlled- con't regimen  10/3- had BP of 80s/50s yesterday- hydralazine stopped- is better this AM- will see how does in therapy with standing- concern will drop  10/4- will decrease Metoprolol to 25 mg BID- not change to succinate, since won't have ability to change meds as often.   10/5- BP overnight 110s/120s supine- will see how tolerates therapy today.   10/6- BP doing better, they increased her B blocker back to 37.5 mg BID   10/13 well controlled, continue to monitor  10/16- BP very slightly elevated, but overall controlled- con't regimen  10/17- BP doing great this Am- con't regimen  10/18- BP labile- [id number] Systolic in last 24 hours-   10/19- BP doing better- con't regimen    02/19/2022    5:06 AM 02/19/2022    5:00 AM 02/18/2022    8:15 PM  Vitals with BMI  Weight  199 lbs 1 oz   Systolic 595  638  Diastolic 71  78  Pulse 89  102    10: Hyperlipidemia: continue Lipitor 40 mg daily 11: Coagulopathy s/p RUE embolectomy 10/2021: Eliquis PTA now on Lovenox; f                -follow-up hematology as outpt 12: COPD exacerbation: hypoxia resolved; continue bronchodilators/nebs 13: Chronic diastolic and systolic heart failure:             -strict Is and Os and daily weight             -no diuretics currently              -see meds above   Filed Weights   02/15/22 0503 02/17/22 0500 02/19/22 0500  Weight: 84.5 kg 84.5 kg 90.3 kg   10/5- will start Lasix 10 mg daily due to vascualar congestion on CXR  10/6- Per Cards, needs 40 mg daily- will change  10/7 weight appears stable, monitor  10/14: weight is up 2kg, add additional 20mg  lasix today  10/16- will decrease lasix to 20 mg daily since Cr up significantly to 2.46 this AM- that's the highest it's been- usually/lately 2.1 and BUN is 39- will recheck Wednesday.   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   10/3- ate breakfast tray this AM- BG's running from 56 (low yesterday) to 312 yesterday due to foods form home- will ask DM coordinator to help try and manage.   10/4- CBG's mid 100s to mid 200s- a little better in last 24 hours- no hypoglycemia  10/6- Dm coordinator - increased meals novolog 5 units TID with meals and night/QHS 0-5 SSI  10/7 increase Novolog to 7 units TID  10/8 CBGs elevated last night, a little better today, continue monitor response to insulin adjustment  10/10 increase levemir to 27u  10/11 DM coordinator recommending 10 U novolog TID meal coverage and 25 U levemir BID, will make these changes   10/13 well controlled overall, continue current regimen  10/16- still eating food from home- BG's 168-388 this AM- if stays up, will call DM coordinator back  10/17- BG's much better today- 138-228- con't regimen-   10/18- is eating from home-  79-414 in last 24 hours- but except for 414, was doing better- give 1 more day- and then call DM educator/coordinator back for recs.   10/19- using insulin pump- actually, BG"s doing better- will con't 17: Class 2 obesity: dietary counseling 18: Tobacco abuse: cessation counseling; continue nicotine patch 19: AKI atop CKD IV: trend BMP  -9/29 Cr improved to 1.88  10/2- Down to 1.59 and BUN 24- will push fluids and recheck in AM  10/3- Cr 1.64 and BUN 24- basically stable- will recheck Thursday-   10/4- ordered BMP for tomorrow  10/5- Cr 2.12 up from 1.64- but required Lasix 40 mg PO x1- actually will start lasix 10 mg daily due to pulm/vascular congestion-   10/6- CR usually runs ~ 2 per Cards note-   -10/10 CR stable at 2.16  10/16- Cr up to 2.46 and BUN 39- will decrease Lasix to 20 mg daily to see if helps; recheck Wednesday  10/18- Cr up very slightly to 2.53 and BUN stable at 38- no increased resp Sx's/ and sounds similar on exam-   10/19- per IM, is in her range- will be safe to d/c home with close f/u by renal, cards and needs new PCP.  20: CAD: stable; treated medically; Dr. Harl Bowie 21: Left foot osteomyelitis s/p ray amputation [name] -recent bone biopsy; cultures neg - Husband to bring in ?CAM boot for weightbearing, protection - Remove IJ order - 9/30 Noted surgical site dehiscence with PT ambulation, was not wearing orthotic. Podiatry consulted, imaging showing mildly angulated fracture of the distal fourth metatarsal (likely iatrogenic from prior amputation)               - Orders for betadyne dressing daily               - NWB LLE               - Appreciate podiatry recs, to discuss 4th MT fx as group next week  10/2- Podiatry says once has CAM boot to be worn- either from home or for [name] to order- d/w PA-  and will check in to it. Supposedly had a dehiscence but podiatry to see again today.   10/3- podiatry said they'd come back for possible bedside debridement- can WBAT  now with CAM boot  10/11 Seen by  Podiatry for L foot ulcer,  Iodosorb and gauze dressings daily, WBAT LLE in surgical shoe 22: Leukocytosis: trending down; follow-up CBC  -WBC down to 7/1 on 10/9  10/18- WBC 9.2 23. Dysphagia  -Advanced to heart healthy/carb modified/ thin liquids            - 9/30 - family requesting diet liberalization as patient primarily eating foods brought in from outside d/t hospital food bland; carb modified diet unable to be ordered with less restrictive parameters. Dietary consulted.   24. RSV  10/2- will take off Droplet precautions- no more Sx's.  25. Chest pain- in setting of severe cardiac issues including CHF with EF 35-40%, CAD- acute on chronic CHF  10/5- CP last night- required NTG x2- EKG had no changes- troponins elevated probably due to pulmonary congestion which was also found- will transfuse 1 unit to help CP; also restart her home Protonix for hx of reflux.  10/10 CP last night, resolved with NTG, troponins not increased above baseline 26, ABLA  10/5- will transfuse 1 unit- think the Hb 7.8 that came back 5 hours later is likely due to hemoconcentration, and isn't real, since her Hb has been trending down for days. On Lovenox and Brillenta- check Fecal occult for blood loss.   HGB stable at 8.8 on 10/9   10/18- Hb 9.3- improving 27. Insomnia  10/5- will increase trazodone to 75 mg QHS per pt request  10/6- slepeing better per pt.   10/12 Increase trazodone to 100mg  QHS  10/18- pt's trazodone not on list- not clear why/when d/c'd- will restart at 50 mg QHS- since didn't sleep at all last night 28. Constipation  10/17- had enema last night and resolved for now- having bowel /constipation frequently since Stroke- will monitor closely.    Will call IM consult since having so many issues with Kidneys/CHF and Won't eat diet here- only home food, so BG's are out of control.      LOS: 22 days A FACE TO FACE EVALUATION WAS PERFORMED  Lauren [name] 02/19/2022, 9:17 AM

## 2022-02-19 NOTE — Progress Notes (Signed)
Recreational Therapy Discharge Summary Patient Details  Name: Lauren [name] MRN: [id number] Date of Birth: 04/19/1981 Today's Date: 02/19/2022  Comments on progress toward goals: Pt has made good progress during LOS and is discharging home with family today to provide 24 hour supervision/assistance.  TR sessions focused on leisure education, activity analysis/modifications, stress management and animal assisted activities.  Pt is anxious to return home and return to previously enjoyed activities.  Pt will require verbal cues for leisure activities for visual scanning, problem solving, attention, safety awareness and activity modifications.  Reasons for discharge: discharge from hospital  Follow-up: [place] agrees with progress made and goals achieved: Yes  Birdella Sippel 02/19/2022, 8:42 AM

## 2022-02-19 NOTE — Progress Notes (Signed)
Inpatient Rehabilitation Care Coordinator Discharge Note   Patient Details  Name: Lauren [name] MRN: [id number] Date of Birth: 05-26-1980   Discharge location: [facility] WILL PROVIDE 24/7 CARE  Length of Stay: 22 DAYS  Discharge activity level: CGA-MIN ASSIST LEVEL  Home/community participation: ACTIVE  Patient response PR:XYVOPF Literacy - How often do you need to have someone help you when you read instructions, pamphlets, or other written material from your doctor or pharmacy?: Never  Patient response YT:WKMQKM Isolation - How often do you feel lonely or isolated from those around you?: Never  Services provided included: MD, RD, PT, OT, SLP, RN, CM, TR, Pharmacy, Neuropsych, SW  Financial Services:  Charity fundraiser Utilized: Art gallery manager  Choices offered to/list presented to: PT AND HUSBAND  Follow-up services arranged:  [place], DME, Patient/Family has no preference for HH/DME agencies Home Health Agency: [facility] RN    DME : [employer]  WHEELCHAIR AND [address] [place]  FMLA AND STD FORMS GIVEN TO [employer] FOR [place]     Patient response to transportation need: Is the patient able to respond to transportation needs?: Yes In the past 12 months, has lack of transportation kept you from medical appointments or from getting medications?: No In the past 12 months, has lack of transportation kept you from meetings, work, or from getting things needed for daily living?: No    Comments (or additional information):PT DID WELL AND HUSBAND AND DAUGHTER WERE IN FOR HANDS ON [place].   Patient/Family verbalized understanding of follow-up arrangements:  Yes  Individual responsible for coordination of the follow-up plan: JAMES-HUSBAND [phone]  Confirmed correct DME delivered: Elease Hashimoto 02/19/2022    Elease Hashimoto

## 2022-02-20 ENCOUNTER — Other Ambulatory Visit (HOSPITAL_COMMUNITY): Payer: Self-pay

## 2022-02-20 MED ORDER — INSULIN LISPRO 100 UNIT/ML IJ SOLN
INTRAMUSCULAR | 0 refills | Status: DC
  Filled 2022-02-20: qty 10, 20d supply, fill #0

## 2022-02-22 ENCOUNTER — Other Ambulatory Visit (HOSPITAL_COMMUNITY): Payer: Self-pay

## 2022-02-22 ENCOUNTER — Telehealth: Payer: Self-pay | Admitting: Nurse Practitioner

## 2022-02-22 MED ORDER — INSULIN LISPRO 100 UNIT/ML IJ SOLN
INTRAMUSCULAR | 0 refills | Status: DC
  Filled 2022-02-22 (×2): qty 30, 50d supply, fill #0

## 2022-02-22 NOTE — Telephone Encounter (Signed)
Lauren [name] left a VM stating that her prescription for humalog vial says " use as directed " and they can not fill with instructions " use as directed " can you re send with specific instructions

## 2022-02-22 NOTE — Telephone Encounter (Signed)
done

## 2022-02-23 ENCOUNTER — Telehealth: Payer: Self-pay

## 2022-02-23 NOTE — Telephone Encounter (Signed)
Okay given per protocol for in the home physical therapy (twice a week for 2 weeks & once a week for two weeks).

## 2022-02-24 ENCOUNTER — Ambulatory Visit: Payer: No Typology Code available for payment source | Admitting: Podiatry

## 2022-02-24 DIAGNOSIS — L97425 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 without evidence of necrosis: Secondary | ICD-10-CM

## 2022-02-24 DIAGNOSIS — E10621 Type 1 diabetes mellitus with foot ulcer: Secondary | ICD-10-CM

## 2022-02-25 ENCOUNTER — Telehealth: Payer: Self-pay | Admitting: *Deleted

## 2022-02-25 NOTE — Chronic Care Management (AMB) (Signed)
  Care Coordination Note  02/25/2022 Name: Lauren [name] MRN: [id number] DOB: 03-Jun-1980  Lauren [name] is a 41 y.o. year old female who is a primary care patient of Lemmie Evens, MD and is actively engaged with the care management team. I reached out to [name] by phone today to assist with scheduling an initial visit with the Licensed Clinical Social Worker  Follow up plan: Telephone appointment with care management team member scheduled for: 03/02/2022  Julian Hy, [place] Direct Dial: [phone]

## 2022-02-28 NOTE — Progress Notes (Signed)
  Subjective:  Patient ID: Lauren [name], female    DOB: 03/09/1981,  MRN: [id number]  Chief Complaint  Patient presents with   Diabetic Ulcer    Follow up left midfoot - looks like it's healing well    41 y.o. female presents with the above complaint. History confirmed with patient. She is back home from the hospital now  Objective:  Physical Exam: warm, good capillary refill and weakly palpable pulses, minimal sensation, full thickness wound measuring 1.8 x 0.3 x 0.5cm with exposed subcutaneous tissue no s/o infection      Assessment:   1. 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   Ulcer left foot -We discussed the etiology and factors that are a part of the wound healing process.  We also discussed the risk of infection both soft tissue and osteomyelitis from open ulceration.  Discussed the risk of limb loss if this happens or worsens. -Debridement as below. -Dressed with iodosorb, DSD. -Continue home dressing changes daily with 4 x 4 gauze and iodosorb -Continue off-loading with surgical shoe.   Procedure: Excisional Debridement of Wound Rationale: Removal of non-viable soft tissue from the wound to promote healing.  Anesthesia: none Post-Debridement Wound Measurements: 1.8 cm x 0.3 cm x 0.5 cm  Type of Debridement: Sharp Excisional Tissue Removed: Non-viable soft tissue Depth of Debridement: subcutaneous tissue. Technique: Sharp excisional debridement to bleeding, viable wound base.  Dressing: Dry, sterile, compression dressing. Disposition: Patient tolerated procedure well.          Return in about 3 weeks (around 03/17/2022) for wound care.

## 2022-03-01 ENCOUNTER — Telehealth: Payer: Self-pay

## 2022-03-01 MED ORDER — ONDANSETRON HCL 4 MG PO TABS: 4.0000 mg | ORAL_TABLET | Freq: Three times a day (TID) | ORAL | 0 refills | Status: DC | PRN

## 2022-03-01 NOTE — Telephone Encounter (Signed)
Transitional Care call--who you talked with    Are you/is patient experiencing any problems since coming home? Are there any questions regarding any aspect of care? Just nausea in the evenings and sometimes.  Are there any questions regarding medications administration/dosing?  Are meds being taken as prescribed? Patient should review meds with caller to confirm Patient wants to know if she can get a Rx for Zofran since she has been experiencing some nausea  Have there been any falls? No falls  Has Home Health been to the house and/or have they contacted you? If not, have you tried to contact them? Can we help you contact them? PT, OT, ST have all been at home already  Are bowels and bladder emptying properly? Are there any unexpected incontinence issues? If applicable, is patient following bowel/bladder programs? Normal  Any fevers, problems with breathing, unexpected pain? No Are there any skin problems or new areas of breakdown? No Has the patient/family member arranged specialty MD follow up (ie cardiology/neurology/renal/surgical/etc)?  Can we help arrange? Appointments have been arranged  Does the patient need any other services or support that we can help arrange? Not at this time  Are caregivers following through as expected in assisting the patient? Yes Has the patient quit smoking, drinking alcohol, or using drugs as recommended? Yes   Appointment time, arrive time and who it is with here [address] [phone]

## 2022-03-02 ENCOUNTER — Ambulatory Visit: Payer: Self-pay | Admitting: *Deleted

## 2022-03-03 ENCOUNTER — Encounter: Payer: Self-pay | Admitting: *Deleted

## 2022-03-03 ENCOUNTER — Encounter: Payer: No Typology Code available for payment source | Attending: Registered Nurse | Admitting: Registered Nurse

## 2022-03-03 VITALS — BP 138/89 | HR 78 | Ht 65.0 in | Wt 185.0 lb

## 2022-03-03 DIAGNOSIS — E7849 Other hyperlipidemia: Secondary | ICD-10-CM

## 2022-03-03 DIAGNOSIS — I63511 Cerebral infarction due to unspecified occlusion or stenosis of right middle cerebral artery: Secondary | ICD-10-CM

## 2022-03-03 DIAGNOSIS — I10 Essential (primary) hypertension: Secondary | ICD-10-CM

## 2022-03-03 DIAGNOSIS — R112 Nausea with vomiting, unspecified: Secondary | ICD-10-CM | POA: Diagnosis not present

## 2022-03-03 DIAGNOSIS — K5909 Other constipation: Secondary | ICD-10-CM

## 2022-03-03 DIAGNOSIS — E1059 Type 1 diabetes mellitus with other circulatory complications: Secondary | ICD-10-CM | POA: Diagnosis not present

## 2022-03-03 MED ORDER — POLYETHYLENE GLYCOL 3350 17 G PO PACK: 17.0000 g | PACK | Freq: Two times a day (BID) | ORAL | 0 refills | Status: DC

## 2022-03-03 NOTE — Patient Instructions (Signed)
Visit Information  Thank you for taking time to visit with me today. Please don't hesitate to contact me if I can be of assistance to you.   Following are the goals we discussed today:   Goals Addressed               This Visit's Progress     Assist with Applying for Medicaid, [employer] [facility] and [place]. (pt-stated)   On track     Care Coordination Interventions:  Discussed plans with patient for ongoing care management follow up. Provided patient with direct contact information for care management team. Screening for signs and symptoms of depression related to chronic disease state.  Assessed social determinant of health barriers. Active listening/reflection utilized.  Problem solving/task-centered strategies developed. Quality of sleep assessed and sleep hygiene techniques promoted.  Verbalization of feelings encouraged.  Emotional support provided. Mailed the following list of agencies and resources, on 03/02/2022:             Apply for Medicaid On-Line Medicaid Application 9675 Medicaid Tips Personal Care Services Instructions [place] Agency Provider List Community Alternative Program Services Instructions Community Alternative Program Services Application Encouraged to review list of agencies and resources provided, and be prepared to discuss during our next scheduled telephone outreach call.      Our next appointment is by telephone on 03/15/2022 at 12:45 pm.  Please call the care guide team at [phone] if you need to cancel or reschedule your appointment.   If you are experiencing a Mental Health or [place] or need someone to talk to, please call the Suicide and Crisis Lifeline: 988 call the [place] National Suicide Prevention Lifeline: (713) 374-1511 or TTY: (770)605-1235 TTY 773-539-5746) to talk to a trained counselor call 1-800-273-TALK (toll free, 24 hour  hotline) go to [facility] [address], [place] [id number]) call the [place]: [phone] call 911  Patient verbalizes understanding of instructions and care plan provided today and agrees to view in [place]. Active MyChart status and patient understanding of how to access instructions and care plan via MyChart confirmed with patient.     Telephone follow up appointment with care management team member scheduled for:  03/15/2022 at 12:45 pm.  Nat Christen, BSW, MSW, [place]  Licensed Clinical Social Worker  [place]  Mailing [place]. [address] [place], [place] [address] Physical Address-[address] [place], [place] [address] Toll Free Main # [phone] Fax # [phone] Cell # [phone] [email or web address]

## 2022-03-03 NOTE — Progress Notes (Addendum)
Subjective:    Patient ID: Lauren [name], female    DOB: June 07, 1980, 41 y.o.   MRN: [id number]  HPI: Lauren [name] is a 41 y.o. 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   Dr. Curly [name] H&P Note:   Neurology H&P   CC: chest pain, headache, blurred vision   History is obtained from: Patient, husband and chart review    HPI: Lauren [name] is a 41 y.o. 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  He notes that their son had a cough/fever Saturday Sunday and Monday and that she felt bad starting Wednesday evening and has had some fevers as well as cough.   She presented with ischemic right upper extremity on 11/18/2021 and underwent a right brachial, radial and ulnar artery thrombectomy.  Transthoracic echocardiogram was negative for vegetation and there is no evidence of atrial fibrillation or other arrhythmia, although she was noted to have left atrial enlargement.  She was started on Eliquis which she has been adherent to per family.  They note that she has been having heavy bleeding since starting this medication with her monthly cycles, but otherwise no other significant issues with bleeding.  CT Head: WO Contrast  IMPRESSION: 1.  Periventricular white matter hypodensities adjacent to the frontal horn of the right lateral ventricles and in the right corona radiata are new since prior exam. This may represent acute ischemia. 2. No acute or focal cortical abnormality. 3. ASPECTS is 10/10.  CTA:  IMPRESSION: 1. Periventricular white matter hypodensities adjacent to the frontal horn of the right lateral ventricles and in the right corona radiata are new since prior exam. This may represent acute ischemia. 2. No acute or focal cortical abnormality. 3. ASPECTS is 10/10.  CT Cerebral Perfusion  IMPRESSION: 1. 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 Right MCA and A1 ACA are patent, but diminutive. 2. Small/non dominant right vertebral artery, which is irregular throughout its V2 segment with age indeterminate occlusion at the V3 segment with non opacification of the proximal intradural vertebral artery and reconstitution of the distal intradural vertebral artery. 3. Severe left paraclinoid ICA stenosis. 4. One CT perfusion, no evidence of core infarct or penumbra; however, there is approximately 38 mL of T-max greater than 4 seconds in the right MCA territory which suggests possible oligemia. An MRI could provide more sensitive evaluation for acute infarct. 5. Moderate layering bilateral pleural effusions and peribronchial wall thickening. Recommend dedicated chest imaging.   MR Brain:  IMPRESSION: 1. Extensive patchy areas of acute nonhemorrhagic infarction involving the right MCA territory, most confluent areas along the watershed. 2. Focal nonhemorrhagic infarct involving the genu of the corpus callosum and more posteriorly along the body of  the corpus callosum on the right. 3. Diffuse sinus disease is likely secondary to intubation.  Long Complicated Hospital Stay see discharge Summaries for More Details.   Neurology, Cardiology and Podiatry was consulted.   [name] was admitted to inpatient rehabilitation on 01/28/2022 and discharged home on 02/19/2022.She is receiving home health therapy with [name].   She denies any pain at this time. She rates her pain 0. Also reports she has a good appetite.  [name] reports she hasn't had a bowel movement in 5 days and experiencing nausea and vomiting. Miral ordered, she verbalizes understanding. She was instructed to F/U with PCP and to send a My- chart message with update. She and daughter verbalizes understanding.    Daughter in room all questions answered.   [name] had bad debt with [name], she was unable to obtain an appointment. Daughter was instructed to call [name] billing depart to set up payment scheduled, to obtain appointment. She verbalizes understanding.     DR: Lauren [name] Note: 01/21/2022:: See note for more details .  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  LDL 39 HgbA1c 8.0 Hypercoag work-up mildly abnormal lupus anticoagulant likely due to on heparin IV, slightly increased rheumatoid factor.  Recommend repeat in 3 months. VTE prophylaxis -heparin IV Eliquis (apixaban) daily prior to admission, now on heparin IV and Brilinta (ticagrelor) 90 mg bid. Will switch to Lovenox and continue Brilinta.  Pain Inventory Average Pain 1 Pain Right Now 0 My pain is intermittent  LOCATION OF PAIN  elbow, wrist, hand  BOWEL Number of stools per week: 3-4 Oral laxative use No  Type of laxative . Enema or suppository use No  History of colostomy No  Incontinent No    BLADDER Normal In and out cath, frequency . Able to self cath  . Bladder incontinence No  Frequent urination No  Leakage with coughing No  Difficulty starting stream No  Incomplete bladder emptying No    Mobility walk with assistance use a walker how many minutes can you walk? 2 ability to climb steps?  yes do you drive?  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 I need assistance with the following:  dressing, bathing, and toileting  Neuro/Psych trouble walking  Prior Studies TC appt  Physicians involved in your care TC appt   Family History  Problem Relation Age of Onset   Thyroid disease Mother    Hypertension Mother    Hyperlipidemia Mother    CAD Mother    CVA Mother    Hyperlipidemia Father    Hypertension Father    CAD Father    Breast cancer Paternal Grandmother 73   Cancer Paternal Grandmother        lung   Social History   Socioeconomic History   Marital status: Married    Spouse name: Jerelyn Scott   Number of children: 2   Years of education: 12   Highest education level: 12th grade  Occupational History    Comment: Full time   Occupation: CNA at [employer] Use   Smoking status: Former  Packs/day: 0.25    Types: Cigarettes    Quit date: 10/27/2020    Years since quitting: 1.3    Passive exposure: Past   Smokeless tobacco: Never  Vaping Use   Vaping Use: Never used  Substance and Sexual Activity   Alcohol use: No    Alcohol/week: 0.0 standard drinks of alcohol   Drug use: No   Sexual activity: Yes    Birth control/protection: Surgical    Comment: tubal   Other Topics Concern   Not on file  Social History Narrative   Lives with husband and kids   Right handed   Drinks 9+ cups caffeine daily   Social Determinants of Health   Financial Resource Strain: Low Risk  (03/03/2022)   Overall Financial Resource Strain (CARDIA)    Difficulty of Paying Living  Expenses: Not very hard  Recent Concern: Emergency planning/management officer Strain - High Risk (01/14/2022)   Overall Financial Resource Strain (CARDIA)    Difficulty of Paying Living Expenses: Hard  Food Insecurity: No Food Insecurity (03/03/2022)   Hunger Vital Sign    Worried About Running Out of Food in the Last Year: Never true    Ran Out of Food in the Last Year: Never true  Recent Concern: Food Insecurity - Food Insecurity Present (01/16/2022)   Hunger Vital Sign    Worried About Running Out of Food in the Last Year: Sometimes true    Ran Out of Food in the Last Year: Sometimes true  Transportation Needs: No Transportation Needs (03/03/2022)   PRAPARE - Hydrologist (Medical): No    Lack of Transportation (Non-Medical): No  Physical Activity: Inactive (03/03/2022)   Exercise Vital Sign    Days of Exercise per Week: 0 days    Minutes of Exercise per Session: 0 min  Stress: No Stress Concern Present (03/03/2022)   [place]    Feeling of Stress : Only a little  Recent Concern: Stress - Stress Concern Present (12/22/2021)   [place]    Feeling of Stress : To some extent  Social Connections: Socially Integrated (03/03/2022)   Social Connection and Isolation Panel [NHANES]    Frequency of Communication with Friends and Family: More than three times a week    Frequency of Social Gatherings with Friends and Family: More than three times a week    Attends Religious Services: More than 4 times per year    Active Member of Clubs or Organizations: Yes    Attends Archivist Meetings: More than 4 times per year    Marital Status: Married   Past Surgical History:  Procedure Laterality Date   AMPUTATION Left 09/02/2021   Procedure: AMPUTATION RAY;  Surgeon: Criselda Peaches, DPM;  Location: [facility];  Service: Podiatry;  Laterality: Left;   sagittal saw, 3L bag saline & Pulse   Cardiac catherization     CHOLECYSTECTOMY     I & D EXTREMITY Left 08/30/2021   Procedure: IRRIGATION AND DEBRIDEMENT WITH BONE BIOPSY;  Surgeon: [name], DPM;  Location: [facility];  Service: Podiatry;  Laterality: Left;   IR ANGIO VERTEBRAL SEL SUBCLAVIAN INNOMINATE UNI L MOD SED  01/18/2022   IR CT HEAD LTD  01/08/2022   IR INTRA CRAN STENT  01/08/2022   IR PERCUTANEOUS ART THROMBECTOMY/INFUSION INTRACRANIAL INC DIAG ANGIO  01/08/2022   IRRIGATION AND DEBRIDEMENT FOOT Left 09/04/2021  Procedure: IRRIGATION AND DEBRIDEMENT FOOT AND CLOSURE;  Surgeon: Criselda Peaches, DPM;  Location: [facility];  Service: Podiatry;  Laterality: Left;   RADIOLOGY WITH ANESTHESIA N/A 01/08/2022   Procedure: IR WITH ANESTHESIA;  Surgeon: Radiologist, Medication, MD;  Location: [facility];  Service: Radiology;  Laterality: N/A;   THROMBECTOMY BRACHIAL ARTERY Right 11/18/2021   Procedure: RIGHT BRACHIAL, RADIAL, & ULNAR ARTERY THROMBECTOMY.;  Surgeon: Marty Heck, MD;  Location: [facility];  Service: Vascular;  Laterality: Right;   TUBAL LIGATION     Past Medical History:  Diagnosis Date   Anemia    CAD (coronary artery disease)    a. s/p cath in 03/2014 showing 30% mid-LAD, moderate to severe disease along small D1, patent LCx, moderate to severe distal OM2 stenosis and moderate diffuse diease along RCA not amenable to PCI   CHF (congestive heart failure) ([place])    a. EF 55-60% in 12/2019 b. EF at 35-40% by echo in 05/2020   CKD (chronic kidney disease) stage 4, GFR 15-29 ml/min (HCC)    Diabetes mellitus without complication (HCC)    Myocardial infarction ([place])    Neuropathy    Stroke ([place])    BP 138/89   Pulse 78   Ht 5\' 5"  (1.651 m)   Wt 185 lb (83.9 kg)   SpO2 98%   BMI 30.79 kg/m   Opioid Risk Score:   Fall Risk Score:  `1  Depression screen PHQ 2/9     03/03/2022    1:35 PM 03/03/2022   10:08 AM 12/22/2021    6:14 PM 12/15/2021    4:29 PM 12/15/2021    4:02 PM  12/09/2021    6:20 PM 07/23/2020   10:01 AM  Depression screen PHQ 2/9  Decreased Interest 0 0 0 0 0 0 0  Down, Depressed, Hopeless 0 0 1 1 1 1  0  PHQ - 2 Score 0 0 1 1 1 1  0  Altered sleeping 0      1  Tired, decreased energy 0      0  Change in appetite 0      3  Feeling bad or failure about yourself  0      0  Trouble concentrating 0      0  Moving slowly or fidgety/restless 0      0  Suicidal thoughts 0      0  PHQ-9 Score 0      4     Review of Systems  Musculoskeletal:        Elbow, wrist, hand pain  All other systems reviewed and are negative.      Objective:   Physical Exam Vitals and nursing note reviewed.  Constitutional:      Appearance: Normal appearance.  Cardiovascular:     Rate and Rhythm: Normal rate and regular rhythm.     Pulses: Normal pulses.     Heart sounds: Normal heart sounds.  Musculoskeletal:     Cervical back: Normal range of motion and neck supple.     Comments: Normal Muscle Bulk and Muscle Testing Reveals:  Upper Extremities: Right: Full ROM and Muscle Strength 5/5 Left Upper Extremity: Flaccid and Muscle Strength 1/5:  Left hand swelling noted  Lower Extremities: Full ROM and Muscle Strength 5/5 Arrived in wheelchair      Skin:    General: Skin is warm and dry.  Neurological:     Mental Status: She is alert and oriented to person, place, and  time.  Psychiatric:        Mood and Affect: Mood normal.        Behavior: Behavior normal.         Assessment & Plan:  Acute Right MCA Stroke: Continue Home Health Therapy : [facility]. [facility] was called to schedule HFU appointment. Due to bad debt, daughter will call their billing to schedule payment plan. Then she will be scheduled for HFU appointment. 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  , Essential Hypertension: Continue current medication regimen. PCP Following.  , Hyperlipidemia: Continue current  Medication regimen. PCP following. Continue to Monitor.  , Nausea and Vomiting: Continue Zofran. Continue to Monitor.   Constipation. RX: Miralax  F/U with Dr Dagoberto Ligas in 4- 6 weeks

## 2022-03-03 NOTE — Patient Instructions (Signed)
Please obtain a Primary Care Physician:  Check on the [facility] Website

## 2022-03-03 NOTE — Patient Outreach (Signed)
  Care Coordination   Initial Visit Note   03/03/2022  Name: Lauren [name] MRN: [id number] DOB: 1980/06/11  Lauren [name] is a 41 y.o. year old female who sees Lemmie Evens, MD for primary care. I spoke with Lauren [name] by phone today.  What matters to the patients health and wellness today?   Assist with Applying for Medicaid, [employer] [facility] and [place].    Goals Addressed               This Visit's Progress     Assist with Applying for Medicaid, [employer] [facility] and [place]. (pt-stated)   On track     Care Coordination Interventions:  Discussed plans with patient for ongoing care management follow up. Provided patient with direct contact information for care management team. Screening for signs and symptoms of depression related to chronic disease state.  Assessed social determinant of health barriers. Active listening/reflection utilized.  Problem solving/task-centered strategies developed. Quality of sleep assessed and sleep hygiene techniques promoted.  Verbalization of feelings encouraged.  Emotional support provided. Mailed the following list of agencies and resources, on 03/02/2022:             Apply for Medicaid On-Line Medicaid Application 2992 Medicaid Tips Personal Care Services Instructions [place] Agency Provider List [facility] Instructions [facility] Application Encouraged to review list of agencies and resources provided, and be prepared to discuss during our next scheduled telephone outreach call.        SDOH assessments and interventions completed:  Yes.  SDOH Interventions Today    Flowsheet Row Most Recent Value  SDOH Interventions   Food Insecurity Interventions Intervention Not Indicated  Housing Interventions Intervention Not Indicated  Transportation  Interventions Intervention Not Indicated  Utilities Interventions Intervention Not Indicated  Alcohol Usage Interventions Intervention Not Indicated (Score <7)  Financial Strain Interventions Intervention Not Indicated  Physical Activity Interventions Patient Refused  Stress Interventions Intervention Not Indicated  Social Connections Interventions Intervention Not Indicated     SDOH assessments and interventions completed:  Yes.   Care Coordination Interventions Activated:  Yes.    Care Coordination Interventions:  Yes, provided.    Follow up plan: Follow up call scheduled for 03/15/2022 at 12:45 pm.   Encounter Outcome:  Pt. Visit Completed.    Nat Christen, BSW, MSW, LCSW  Licensed Education officer, [facility]  Mailing [place] [address], [place], [place] [address] Physical Address-[address], [place], [place] [address] Toll Free Main # [phone] Fax # [phone] Cell # [phone] [email or web address]

## 2022-03-05 ENCOUNTER — Telehealth (HOSPITAL_COMMUNITY): Payer: Self-pay | Admitting: Specialist

## 2022-03-05 ENCOUNTER — Ambulatory Visit: Payer: No Typology Code available for payment source | Attending: Cardiology | Admitting: Cardiology

## 2022-03-05 ENCOUNTER — Encounter: Payer: Self-pay | Admitting: Registered Nurse

## 2022-03-05 ENCOUNTER — Encounter: Payer: Self-pay | Admitting: Cardiology

## 2022-03-05 VITALS — BP 136/76 | HR 78 | Ht 65.0 in | Wt 191.0 lb

## 2022-03-05 DIAGNOSIS — Z8673 Personal history of transient ischemic attack (TIA), and cerebral infarction without residual deficits: Secondary | ICD-10-CM | POA: Diagnosis not present

## 2022-03-05 DIAGNOSIS — I5022 Chronic systolic (congestive) heart failure: Secondary | ICD-10-CM | POA: Diagnosis not present

## 2022-03-05 DIAGNOSIS — I2510 Atherosclerotic heart disease of native coronary artery without angina pectoris: Secondary | ICD-10-CM | POA: Diagnosis not present

## 2022-03-05 DIAGNOSIS — N184 Chronic kidney disease, stage 4 (severe): Secondary | ICD-10-CM

## 2022-03-05 MED ORDER — FUROSEMIDE 40 MG PO TABS: ORAL_TABLET | ORAL | 3 refills | Status: DC

## 2022-03-05 MED ORDER — ENOXAPARIN SODIUM 120 MG/0.8ML IJ SOSY: 120.0000 mg | PREFILLED_SYRINGE | INTRAMUSCULAR | 6 refills | Status: DC

## 2022-03-05 NOTE — Patient Instructions (Signed)
Medication Instructions:  Take Lasix 40 mg daily as needed for swelling.   I refilled your Lovenox per Dr.Branch    Labwork: None today  Testing/Procedures: None today  Follow-Up: 2 months  Any Other Special Instructions Will Be Listed Below (If Applicable).   You have been referred to [facility], [place]. They will call you to schedule your appointment.   If you need a refill on your cardiac medications before your next appointment, please call your pharmacy.

## 2022-03-05 NOTE — Telephone Encounter (Signed)
Called patient at their request to discuss concerns with their recent inpatient rehabilitation stay.  No answer, left a voicemail requesting a call back.

## 2022-03-05 NOTE — Progress Notes (Signed)
Clinical Summary Lauren [name] is a 41 y.o.femaleseen today for follow up of the following medical problems.    1. Chronic systolic HF Jan 5916 echo: LVEF 35-40%, grade III dd, apex akinetic - imdur lowered to 15mg  daily due to headaches  - side effects of dizziness on higher beta blocker dosing, titrate slowly - avoiding ACe/ARB/ARNI/aldactone/SGLT2i given poor renal function. Had low bp's during recent admission, hydral stopped.  - headaches on higher imdur doses   - 03/2014 cath: LVEF >60%, mid LAD 30%, D1 mod to severe small vessel, distally pruned, distal 50-60% disease. LCX patent, OM2, mild disease with distal mod to severe, RCA diffuse moderate disease not ameanble to PCI - given renal function we have not repeated her cath despite drop in LVEF 02/2020 nuclear stress: large periapical defect consistent with scar, mild apical anterolateral ischemia.   Echo reviewed , LVEF 35-40% with apical akinesis. I reviewed the echo which I had read in 12/2019 and compared images side by side. 12/2019 LVEF likely an overestimate and closer to 40%, difficult assessment given the base and mid ventricle move well and main dysfunction is focused in the apex.       Jan 2022 echo: LVEF 35-40%, apex akinetic 01/2022 echo: LVEF 35-40%, grade II dd.     No recent LE edema. Sleeps on wedge, has some chronic orthopnea -no SOB/DOE - compliant with meds - no recent chest pains.  - off lasix, no recent swelling.  - home blood pressures at goal - home scale around 188 lbs    2. HTN -complantwith meds       3. CAD/ Elevated troponin 03/2014 Records from [facility] - admitted with DKA, found to have elevated troponins - echo with normal LVEF 65-70% - during stress test had new LBBB, referred for cath - 03/2014 cath: LVEF >60%, mid LAD 30%, D1 mod to severe small vessel, distally pruned, distal 50-60% disease. LCX patent, OM2, mild disease with distal mod to severe, RCA diffuse moderate disease not  ameanble to PCI       - admitted 01/2020 with CVA, iin this setting elevated trop to 413. EKG without acute ischemic changes - echo with some apical hypokinesis - workup deferred to allow her time to recover from stroke, also thought was this could be a stress induced CM   02/2020 nuclear stress: large periapical defect consistent with scar, mild apical anterolateral ischemia.     Jan 2022 echo LVEF 35-40%, grade III dd, apex akinetic       - no recent chest pain - no SOB/DOE - no recent edema. Has not had to take lasix in several months.          3. CKD -needs to establish with [facility] who saw her in hospital     4. 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  - -Etiology unclear likely large vessel disease, cannot rule out cardioembolic source  -Hypercoagulable work-up showed abnormal lupus anticoagulant but was also on heparin, slightly increased rheumatoid factor  -She was previously on Eliquis and now switched to Lovenox per oncology recommendation, she should be on Lovenox therapeutically.  Patient is also on Brilinta.           5. DM2 - followed by pcp   6. Hyperlipidemia - compliant with  meds   7. Upper extremity ischemia - admit 10/2021 with right brachial/radial/ulnar ischemic requriing thrombectomy - dischatged on eliquis combined with the plavix she was already on  -Transthoracic echocardiogram is negative for thrombus  -to f/u with vascular Dr Carlis Abbott in February 2024    8. 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 s/p cath in 03/2014 showing 30% mid-LAD, moderate to severe disease along small  D1, patent LCx, moderate to severe distal OM2 stenosis and moderate diffuse diease along RCA not amenable to PCI   CHF (congestive heart failure) ([place])    a. EF 55-60% in 12/2019 b. EF at 35-40% by echo in 05/2020   CKD (chronic kidney disease) stage 4, GFR 15-29 ml/min (HCC)    Diabetes mellitus without complication (HCC)    Myocardial infarction (HCC)    Neuropathy    Stroke (HCC)      Allergies  Allergen Reactions   Wellbutrin [Bupropion] Hives   Cefepime Rash    Tolerates penicilllin   Ciprofloxacin Hcl Hives and Rash    Hives/rash at injection site; 01/15/22 tolerated IV cipro   Tape Rash     Current Outpatient Medications  Medication Sig Dispense Refill   acetaminophen (TYLENOL) 325 MG tablet Take 1-2 tablets (325-650 mg total) by mouth every 4 (four) hours as needed for mild pain. 100 tablet 0   atorvastatin (LIPITOR) 40 MG tablet Take 1 tablet (40 mg total) by mouth daily. 30 tablet 0   Continuous Blood Gluc Sensor (DEXCOM G6 SENSOR) MISC Change sensor every 10 days as directed 9 each 3   enoxaparin (LOVENOX) 120 MG/0.8ML injection Inject 0.8 mLs (120 mg total) into the skin daily. 24 mL 0   insulin lispro (HUMALOG) 100 UNIT/ML injection Use with Omnipod for TDD around 60 units SQ daily 30 mL 0   isosorbide mononitrate (ISMO) 20 MG tablet Take 1 tablet (20 mg total) by mouth 2 (two) times daily. 60 tablet 0   levothyroxine (SYNTHROID) 125 MCG tablet Take 1 tablet (125 mcg total) by mouth daily at 6 (six) AM. 30 tablet 0   magnesium oxide (MAG-OX) 400 MG tablet Take 0.5 tablets (200 mg total) by mouth at bedtime. 15 tablet 0   methocarbamol (ROBAXIN) 500 MG tablet Take 1 tablet (500 mg total) by mouth every 6 (six) hours as needed for muscle spasms. 60 tablet 0   metoprolol tartrate (LOPRESSOR) 25 MG tablet Take 1.5 tablets (37.5 mg total) by mouth 2 (two) times daily. 90 tablet 0   Multiple Vitamin (MULTIVITAMIN WITH MINERALS) TABS tablet Take 1 tablet by mouth daily.      nicotine (NICODERM CQ - DOSED IN MG/24 HOURS) 21 mg/24hr patch Place 1 patch (21 mg total) onto the skin daily. 28 patch 0   nitroGLYCERIN (NITROSTAT) 0.4 MG SL tablet Place 1 tablet (0.4 mg total) under the tongue every 5 (five) minutes x 3 doses as needed for chest pain. 25 tablet 3   ondansetron (ZOFRAN) 4 MG tablet Take 1 tablet (4 mg total) by mouth every 8 (eight) hours as needed for nausea or vomiting. 90 tablet 0   pantoprazole (PROTONIX) 40 MG tablet Take 1 tablet (40 mg total) by mouth daily. 30 tablet 0   polyethylene glycol (MIRALAX / GLYCOLAX)  17 g packet Take 17 g by mouth 2 (two) times daily. 60 each 0   ramelteon (ROZEREM) 8 MG tablet Take 1 tablet (8 mg total) by mouth at bedtime. 30 tablet 0   ranolazine (RANEXA) 500 MG 12 hr tablet Take 1 tablet (500 mg total) by mouth 2 (two) times daily. 60 tablet 0   ticagrelor (BRILINTA) 90 MG TABS tablet Take 1 tablet (90 mg total) by mouth 2 (two) times daily. 60 tablet 0   tiZANidine (ZANAFLEX) 4 MG tablet Take 1 tablet (4 mg total) by mouth at bedtime. 30 tablet 0   traMADol (ULTRAM) 50 MG tablet Take 1 tablet (50 mg total) by mouth every 6 (six) hours as needed for severe pain. 28 tablet 0   traZODone (DESYREL) 50 MG tablet Take 1 tablet (50 mg total) by mouth at bedtime. 30 tablet 0   No current facility-administered medications for this visit.     Past Surgical History:  Procedure Laterality Date   AMPUTATION Left 09/02/2021   Procedure: AMPUTATION RAY;  Surgeon: Criselda Peaches, DPM;  Location: [place];  Service: Podiatry;  Laterality: Left;  sagittal saw, 3L bag saline & Pulse   Cardiac catherization     CHOLECYSTECTOMY     I & D EXTREMITY Left 08/30/2021   Procedure: IRRIGATION AND DEBRIDEMENT WITH BONE BIOPSY;  Surgeon: [name], DPM;  Location: [place];  Service: Podiatry;  Laterality: Left;   IR ANGIO VERTEBRAL SEL SUBCLAVIAN INNOMINATE UNI L MOD SED  01/18/2022   IR CT HEAD LTD  01/08/2022   IR INTRA CRAN STENT  01/08/2022    IR PERCUTANEOUS ART THROMBECTOMY/INFUSION INTRACRANIAL INC DIAG ANGIO  01/08/2022   IRRIGATION AND DEBRIDEMENT FOOT Left 09/04/2021   Procedure: IRRIGATION AND DEBRIDEMENT FOOT AND CLOSURE;  Surgeon: Criselda Peaches, DPM;  Location: [place];  Service: Podiatry;  Laterality: Left;   RADIOLOGY WITH ANESTHESIA N/A 01/08/2022   Procedure: IR WITH ANESTHESIA;  Surgeon: Radiologist, Medication, MD;  Location: [place];  Service: Radiology;  Laterality: N/A;   THROMBECTOMY BRACHIAL ARTERY Right 11/18/2021   Procedure: RIGHT BRACHIAL, RADIAL, & ULNAR ARTERY THROMBECTOMY.;  Surgeon: Marty Heck, MD;  Location: [facility];  Service: Vascular;  Laterality: Right;   TUBAL LIGATION       Allergies  Allergen Reactions   Wellbutrin [Bupropion] Hives   Cefepime Rash    Tolerates penicilllin   Ciprofloxacin Hcl Hives and Rash    Hives/rash at injection site; 01/15/22 tolerated IV cipro   Tape Rash      Family History  Problem Relation Age of Onset   Thyroid disease Mother    Hypertension Mother    Hyperlipidemia Mother    CAD Mother    CVA Mother    Hyperlipidemia Father    Hypertension Father    CAD Father    Breast cancer Paternal Grandmother 45   Cancer Paternal Grandmother        lung     Social History Ms. Grandt reports that she quit smoking about 16 months ago. Her smoking use included cigarettes. She smoked an average of .25 packs per day. She has been exposed to tobacco smoke. She has never used smokeless tobacco. Ms. Justiss reports no history of alcohol use.   Review of Systems CONSTITUTIONAL: No weight loss, fever, chills, weakness or fatigue.  HEENT: Eyes: No visual loss, blurred vision, double vision or yellow sclerae.No hearing loss, sneezing, congestion, runny nose or sore throat.  SKIN: No rash  or itching.  CARDIOVASCULAR:per hpi  RESPIRATORY: No shortness of breath, cough or sputum.  GASTROINTESTINAL: No anorexia, nausea, vomiting or diarrhea. No abdominal pain or blood.   GENITOURINARY: No burning on urination, no polyuria NEUROLOGICAL: left sided weakness MUSCULOSKELETAL: No muscle, back pain, joint pain or stiffness.  LYMPHATICS: No enlarged nodes. No history of splenectomy.  PSYCHIATRIC: No history of depression or anxiety.  ENDOCRINOLOGIC: No reports of sweating, cold or heat intolerance. No polyuria or polydipsia.  Marland Kitchen   Physical Examination Today's Vitals   03/05/22 1358  BP: 136/76  Pulse: 78  SpO2: 99%  Weight: 191 lb (86.6 kg)  Height: 5\' 5"  (1.651 m)   Body mass index is 31.78 kg/m.  Gen: resting comfortably, no acute distress HEENT: no scleral icterus, pupils equal round and reactive, no palptable cervical adenopathy,  CV: RRR, no m/r/g, no jvd Resp: Clear to auscultation bilaterally GI: abdomen is soft, non-tender, non-distended, normal bowel sounds, no hepatosplenomegaly MSK: extremities are warm, no edema.  Skin: warm, no rash Psych: appropriate affect   Diagnostic Studies  12/2019 echo IMPRESSIONS     1. Left ventricular ejection fraction, by estimation, is 55 to 60%. The  left ventricle has normal function. The left ventricle demonstrates  regional wall motion abnormalities (see scoring diagram/findings for  description). There is mild left ventricular   hypertrophy. Left ventricular diastolic parameters were normal. There is  moderate hypokinesis of the left ventricular, apical. There is sluggish  apical flow without clear formed thrombus.   2. Right ventricular systolic function is normal. The right ventricular  size is normal. There is mildly elevated pulmonary artery systolic  pressure.   3. The mitral valve is normal in structure. Trivial mitral valve  regurgitation. No evidence of mitral stenosis.   4. The inferior vena cava is normal in size with greater than 50%  respiratory variability, suggesting right atrial pressure of 3 mmHg.   5. The aortic valve is tricuspid. Aortic valve regurgitation is not   visualized. No aortic stenosis is present.     02/2020 nuclear stress No diagnostic ST segment changes to indicate ischemia. Large, severe intensity, periapical defect extending into the anterolateral wall and inferior septum that is largely fixed and consistent with infarct scar. There is a small region of apical anterolateral reversibility indicating a mild ischemic territory. This is a high risk study based on defect size although region is most consistent with scar and a mild region of peri-infarct ischemia. Nuclear stress EF: 48%. Large area of periapical akinesis.     Jan 2022 echo IMPRESSIONS     1. The mid to distal anteroseptal wall is akinetic. The apex is akinetic  with some portions dyskinetic, there is no signs of apical thrombus with  use of echocontrast. The mid to distal inferoseptal wall is akinetic. Marland Kitchen  Left ventricular ejection fraction,   by estimation, is 35 to 40%. The left ventricle has moderately decreased  function. Left ventricular diastolic parameters are consistent with Grade  III diastolic dysfunction (restrictive). Elevated left atrial pressure.   2. Right ventricular systolic function is normal. The right ventricular  size is normal.   3. Left atrial size was mildly dilated.   4. The mitral valve is normal in structure. Mild mitral valve  regurgitation. No evidence of mitral stenosis.   5. The inferior vena cava is normal in size with greater than 50%  respiratory variability, suggesting right atrial pressure of 3 mmHg.   6. Limited echo to evaluate LV function  and wall motion.      Assessment and Plan  1.. Chronic systolic HF - probable ICM given her echo and nuclear findings - with her renal dysfunction have not pursued cath -prior side effects on higerh beta blocker dosing. - avoid ACE/ARB/ARNI/aldactone/SGLT2i given renal function. Hydral was stopped during admission with low bp's, may consider retrying. Also likely transition lopressor to  toprol in the near future, given complex admission and just recovering avoiding making too many changes today - will give Rx for prn lasix 40mg  at home    2. CAD - distal/small vessel disease by 2015 cath in [facility] - with recent echo and nuclear findings would suggest she had an apical infarct some time after her cath in 2015 -  With her renal dysfunction we have not repeated a cath.  - no recent symptoms, complex antiplatelet and anticoag regimen based on stroke history as opposed to cardiac reason. She is on daily lovenox, brilinta    3. CVA - on brilinita, lovenox after recent admission with CVA - defer regimen to neuro, we have refilled her lovenox for now as she is having trouble getting scheduled with neuro but would not plan to manage this medication long term  4. Upper extremity ischemia - s/p revasc procedure - continue to follow with vascular        Arnoldo Lenis, M.D.

## 2022-03-09 ENCOUNTER — Telehealth: Payer: Self-pay | Admitting: Physical Medicine and Rehabilitation

## 2022-03-09 NOTE — Telephone Encounter (Signed)
Ben PT with [facility] needs to order a MSW to patient's plan of care.  Patient needs resources and thinks that MSW could help patient.  Please call Ben @ [phone].

## 2022-03-09 NOTE — Telephone Encounter (Signed)
Notified of approval

## 2022-03-10 ENCOUNTER — Telehealth: Payer: Self-pay

## 2022-03-11 NOTE — Telephone Encounter (Signed)
Called susan and advised patient needs to contact pcp, unable to get appt until December, advised if problem persists may need to go to urgent care or ER

## 2022-03-12 ENCOUNTER — Encounter: Payer: Self-pay | Admitting: Hematology and Oncology

## 2022-03-12 ENCOUNTER — Other Ambulatory Visit (HOSPITAL_COMMUNITY): Payer: Self-pay

## 2022-03-12 ENCOUNTER — Inpatient Hospital Stay: Payer: No Typology Code available for payment source

## 2022-03-12 ENCOUNTER — Other Ambulatory Visit: Payer: Self-pay | Admitting: *Deleted

## 2022-03-12 ENCOUNTER — Inpatient Hospital Stay
Payer: No Typology Code available for payment source | Attending: Hematology and Oncology | Admitting: Hematology and Oncology

## 2022-03-12 ENCOUNTER — Other Ambulatory Visit: Payer: Self-pay

## 2022-03-12 VITALS — BP 120/76 | HR 76 | Temp 98.0°F | Resp 16 | Ht 65.0 in | Wt 197.3 lb

## 2022-03-12 DIAGNOSIS — E1122 Type 2 diabetes mellitus with diabetic chronic kidney disease: Secondary | ICD-10-CM | POA: Diagnosis not present

## 2022-03-12 DIAGNOSIS — Z8673 Personal history of transient ischemic attack (TIA), and cerebral infarction without residual deficits: Secondary | ICD-10-CM | POA: Diagnosis not present

## 2022-03-12 DIAGNOSIS — I639 Cerebral infarction, unspecified: Secondary | ICD-10-CM | POA: Diagnosis present

## 2022-03-12 DIAGNOSIS — I2510 Atherosclerotic heart disease of native coronary artery without angina pectoris: Secondary | ICD-10-CM | POA: Insufficient documentation

## 2022-03-12 DIAGNOSIS — R531 Weakness: Secondary | ICD-10-CM | POA: Diagnosis not present

## 2022-03-12 DIAGNOSIS — I252 Old myocardial infarction: Secondary | ICD-10-CM | POA: Insufficient documentation

## 2022-03-12 DIAGNOSIS — I509 Heart failure, unspecified: Secondary | ICD-10-CM | POA: Diagnosis not present

## 2022-03-12 DIAGNOSIS — Z9049 Acquired absence of other specified parts of digestive tract: Secondary | ICD-10-CM | POA: Insufficient documentation

## 2022-03-12 DIAGNOSIS — Z7901 Long term (current) use of anticoagulants: Secondary | ICD-10-CM | POA: Diagnosis not present

## 2022-03-12 MED ORDER — LEVOTHYROXINE SODIUM 125 MCG PO TABS
125.0000 ug | ORAL_TABLET | Freq: Every day | ORAL | 0 refills | Status: DC
  Filled 2022-03-12 – 2022-03-13 (×2): qty 30, 30d supply, fill #0

## 2022-03-12 MED ORDER — ISOSORBIDE MONONITRATE 20 MG PO TABS
20.0000 mg | ORAL_TABLET | Freq: Two times a day (BID) | ORAL | 0 refills | Status: DC
  Filled 2022-03-12 – 2022-03-13 (×2): qty 60, 30d supply, fill #0

## 2022-03-12 MED ORDER — MAGNESIUM OXIDE 400 MG PO TABS
250.0000 mg | ORAL_TABLET | Freq: Every day | ORAL | 0 refills | Status: DC
  Filled 2022-03-12: qty 15, 30d supply, fill #0

## 2022-03-12 MED ORDER — RANOLAZINE ER 500 MG PO TB12
500.0000 mg | ORAL_TABLET | Freq: Two times a day (BID) | ORAL | 0 refills | Status: DC
  Filled 2022-03-12 – 2022-03-13 (×2): qty 60, 30d supply, fill #0

## 2022-03-12 MED ORDER — TIZANIDINE HCL 4 MG PO TABS
4.0000 mg | ORAL_TABLET | Freq: Every day | ORAL | 0 refills | Status: DC
  Filled 2022-03-12 – 2022-03-13 (×2): qty 30, 30d supply, fill #0

## 2022-03-12 MED ORDER — ATORVASTATIN CALCIUM 40 MG PO TABS
40.0000 mg | ORAL_TABLET | Freq: Every day | ORAL | 0 refills | Status: DC
  Filled 2022-03-12 – 2022-03-13 (×2): qty 30, 30d supply, fill #0

## 2022-03-12 MED ORDER — PANTOPRAZOLE SODIUM 40 MG PO TBEC
40.0000 mg | DELAYED_RELEASE_TABLET | Freq: Every day | ORAL | 0 refills | Status: DC
  Filled 2022-03-12 – 2022-03-13 (×2): qty 30, 30d supply, fill #0

## 2022-03-12 NOTE — Progress Notes (Signed)
[facility] NOTE  Patient Care Team: Lemmie Evens, MD as PCP - General (Family Medicine) Harl Bowie, Alphonse Guild, MD as PCP - Cardiology (Cardiology) Phillips Odor, MD as Consulting Physician (Neurology) Saporito, Maree Erie, LCSW as Social Worker (Licensed Clinical Social Worker)  CHIEF COMPLAINTS/PURPOSE OF CONSULTATION:  Stroke  ASSESSMENT & PLAN:   This is a very pleasant 41 year old female with multiple medical comorbidities including uncontrolled diabetes, MI, CKD, coronary artery disease, history of stroke, arterial thrombosis recently admitted with stroke while on Eliquis hence hematology was consulted.  When she was in the hospital we did some hypercoagulable work-up which did not show definitive evidence of hypercoagulable disorder except for abnormal lupus anticoagulant which could be from concomitant anticoagulation. No evidence of factor V Leiden, prothrombin gene, Antithrombin III, beta-2 glycoprotein, cardiolipin antibodies.  Protein C&S activity were normal. She is currently on Lovenox for anticoagulation, tolerating it very well. We discussed about further work-up including JAK2, PNH panel and repeating lupus anticoagulant today.  We have discussed about options for anticoagulation including Lovenox versus warfarin indefinitely at this point.  She understands that we may not be able to come up with a reason why she clots in the first place but despite the reason, it is important to continue lifelong anticoagulation.  For now she wants to continue Lovenox since she has been taking injections from the age of 53, she is okay with taking more injections. When she gets tired of injections however she can be transitioned to warfarin with an INR target of 2-3. She requests refill for 1 month because she is yet to establish with her PCP and has her first appointment scheduled for first week of December.  She understands that she may have to talk to her PCP for  future referrals and she is agreeable.  At this time, I recommended a telephone visit in a couple weeks to review lab results from today.  Unfortunately we could not draw labs today because PNH can only be done on certain days of the week.  She will return to clinic for follow-up to do this. She will return to clinic in person in 1 year unless needed sooner. HISTORY OF PRESENTING ILLNESS:   Lauren [name] 41 y.o. female is here because of stroke at young age  This is a very pleasant 41 year old female patient with uncontrolled diabetes, myocardial infarction, CHF, CKD, coronary artery disease history of stroke who was recently admitted with another stroke back in September while on Eliquis.  She also was diagnosed with osteomyelitis and was on antibiotics at that time.  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  There was no evidence of cardioembolic etiology.  She was on heparin at the time of admission.  Given new stroke while on Eliquis hematology was consulted for anticoagulation recommendations.  She is here with her daughter and her son today.  Since discharge she has been continuing on Lovenox for anticoagulation.  She continues to have left arm weakness and left leg weakness but has been slowly trying to do some activities of daily living by herself.  She otherwise denies any new complaints.  She is yet to establish with a primary care doctor in the first week of December and is wondering if we can give her a 30-day refill for some of her medications.  No bleeding complaints reported. Rest of the pertinent 10 point  ROS reviewed and negative   MEDICAL HISTORY:  Past Medical History:  Diagnosis Date   Anemia    CAD (coronary artery disease)    a. s/p cath in 03/2014 showing 30% mid-LAD, moderate to severe disease along small D1, patent LCx, moderate to severe distal OM2 stenosis and  moderate diffuse diease along RCA not amenable to PCI   CHF (congestive heart failure) ([place])    a. EF 55-60% in 12/2019 b. EF at 35-40% by echo in 05/2020   CKD (chronic kidney disease) stage 4, GFR 15-29 ml/min (HCC)    Diabetes mellitus without complication ([place])    Myocardial infarction ([place])    Neuropathy    Stroke [facility])     SURGICAL HISTORY: Past Surgical History:  Procedure Laterality Date   AMPUTATION Left 09/02/2021   Procedure: AMPUTATION RAY;  Surgeon: Criselda Peaches, DPM;  Location: [place];  Service: Podiatry;  Laterality: Left;  sagittal saw, 3L bag saline & Pulse   Cardiac catherization     CHOLECYSTECTOMY     I & D EXTREMITY Left 08/30/2021   Procedure: IRRIGATION AND DEBRIDEMENT WITH BONE BIOPSY;  Surgeon: [name], DPM;  Location: [place];  Service: Podiatry;  Laterality: Left;   IR ANGIO VERTEBRAL SEL SUBCLAVIAN INNOMINATE UNI L MOD SED  01/18/2022   IR CT HEAD LTD  01/08/2022   IR INTRA CRAN STENT  01/08/2022   IR PERCUTANEOUS ART THROMBECTOMY/INFUSION INTRACRANIAL INC DIAG ANGIO  01/08/2022   IRRIGATION AND DEBRIDEMENT FOOT Left 09/04/2021   Procedure: IRRIGATION AND DEBRIDEMENT FOOT AND CLOSURE;  Surgeon: Criselda Peaches, DPM;  Location: [place];  Service: Podiatry;  Laterality: Left;   RADIOLOGY WITH ANESTHESIA N/A 01/08/2022   Procedure: IR WITH ANESTHESIA;  Surgeon: Radiologist, Medication, MD;  Location: [place];  Service: Radiology;  Laterality: N/A;   THROMBECTOMY BRACHIAL ARTERY Right 11/18/2021   Procedure: RIGHT BRACHIAL, RADIAL, & ULNAR ARTERY THROMBECTOMY.;  Surgeon: Marty Heck, MD;  Location: [place];  Service: Vascular;  Laterality: Right;   TUBAL LIGATION      SOCIAL HISTORY: Social History   Socioeconomic History   Marital status: Married    Spouse name: Jerelyn Scott   Number of children: 2   Years of education: 12   Highest education level: 12th grade  Occupational History    Comment: Full time   Occupation: CNA at [employer] Use    Smoking status: Former    Packs/day: 0.25    Types: Cigarettes    Quit date: 10/27/2020    Years since quitting: 1.3    Passive exposure: Past   Smokeless tobacco: Never  Vaping Use   Vaping Use: Never used  Substance and Sexual Activity   Alcohol use: No    Alcohol/week: 0.0 standard drinks of alcohol   Drug use: No   Sexual activity: Yes    Birth control/protection: Surgical    Comment: tubal   Other Topics Concern   Not on file  Social History Narrative   Lives with husband and kids   Right handed   Drinks 9+ cups caffeine daily   Social Determinants of Health   Financial Resource Strain: Low Risk  (03/03/2022)   Overall Financial Resource Strain (CARDIA)    Difficulty of Paying Living Expenses: Not very hard  Recent Concern: Financial Resource Strain - High Risk (01/14/2022)   Overall Financial Resource Strain (CARDIA)    Difficulty of Paying Living Expenses: Hard  Food Insecurity: No Food Insecurity (03/03/2022)  Hunger Vital Sign    Worried About Running Out of Food in the Last Year: Never true    Ran Out of Food in the Last Year: Never true  Recent Concern: Food Insecurity - Food Insecurity Present (01/16/2022)   Hunger Vital Sign    Worried About Running Out of Food in the Last Year: Sometimes true    Ran Out of Food in the Last Year: Sometimes true  Transportation Needs: No Transportation Needs (03/03/2022)   PRAPARE - Hydrologist (Medical): No    Lack of Transportation (Non-Medical): No  Physical Activity: Inactive (03/03/2022)   Exercise Vital Sign    Days of Exercise per Week: 0 days    Minutes of Exercise per Session: 0 min  Stress: No Stress Concern Present (03/03/2022)   [place]    Feeling of Stress : Only a little  Recent Concern: Stress - Stress Concern Present (12/22/2021)   [place]     Feeling of Stress : To some extent  Social Connections: Socially Integrated (03/03/2022)   Social Connection and Isolation Panel [NHANES]    Frequency of Communication with Friends and Family: More than three times a week    Frequency of Social Gatherings with Friends and Family: More than three times a week    Attends Religious Services: More than 4 times per year    Active Member of [employer] or Organizations: Yes    Attends Music therapist: More than 4 times per year    Marital Status: Married  Human resources officer Violence: Not At Risk (03/03/2022)   Humiliation, Afraid, Rape, and Kick questionnaire    Fear of Current or Ex-Partner: No    Emotionally Abused: No    Physically Abused: No    Sexually Abused: No    FAMILY HISTORY: Family History  Problem Relation Age of Onset   Thyroid disease Mother    Hypertension Mother    Hyperlipidemia Mother    CAD Mother    CVA Mother    Hyperlipidemia Father    Hypertension Father    CAD Father    Breast cancer Paternal Grandmother 83   Cancer Paternal Grandmother        lung    ALLERGIES:  is allergic to wellbutrin [bupropion], cefepime, ciprofloxacin hcl, and tape.  MEDICATIONS:  Current Outpatient Medications  Medication Sig Dispense Refill   acetaminophen (TYLENOL) 325 MG tablet Take 1-2 tablets (325-650 mg total) by mouth every 4 (four) hours as needed for mild pain. 100 tablet 0   atorvastatin (LIPITOR) 40 MG tablet Take 1 tablet (40 mg total) by mouth daily. 30 tablet 0   Continuous Blood Gluc Sensor (DEXCOM G6 SENSOR) MISC Change sensor every 10 days as directed 9 each 3   enoxaparin (LOVENOX) 120 MG/0.8ML injection Inject 0.8 mLs (120 mg total) into the skin daily. 24 mL 6   furosemide (LASIX) 40 MG tablet Take 40 mg daily as needed for swelling 90 tablet 3   insulin lispro (HUMALOG) 100 UNIT/ML injection Use with Omnipod for TDD around 60 units SQ daily 30 mL 0   isosorbide mononitrate (ISMO) 20 MG tablet Take 1  tablet (20 mg total) by mouth 2 (two) times daily. 60 tablet 0   levothyroxine (SYNTHROID) 125 MCG tablet Take 1 tablet (125 mcg total) by mouth daily at 6 (six) AM. 30 tablet 0   magnesium oxide (MAG-OX)  400 MG tablet Take 0.5 tablets (200 mg total) by mouth at bedtime. 15 tablet 0   methocarbamol (ROBAXIN) 500 MG tablet Take 1 tablet (500 mg total) by mouth every 6 (six) hours as needed for muscle spasms. 60 tablet 0   metoprolol tartrate (LOPRESSOR) 25 MG tablet Take 1.5 tablets (37.5 mg total) by mouth 2 (two) times daily. 90 tablet 0   Multiple Vitamin (MULTIVITAMIN WITH MINERALS) TABS tablet Take 1 tablet by mouth daily.     nicotine (NICODERM CQ - DOSED IN MG/24 HOURS) 21 mg/24hr patch Place 1 patch (21 mg total) onto the skin daily. 28 patch 0   nitroGLYCERIN (NITROSTAT) 0.4 MG SL tablet Place 1 tablet (0.4 mg total) under the tongue every 5 (five) minutes x 3 doses as needed for chest pain. 25 tablet 3   ondansetron (ZOFRAN) 4 MG tablet Take 1 tablet (4 mg total) by mouth every 8 (eight) hours as needed for nausea or vomiting. 90 tablet 0   pantoprazole (PROTONIX) 40 MG tablet Take 1 tablet (40 mg total) by mouth daily. 30 tablet 0   polyethylene glycol (MIRALAX / GLYCOLAX) 17 g packet Take 17 g by mouth 2 (two) times daily. 60 each 0   ramelteon (ROZEREM) 8 MG tablet Take 1 tablet (8 mg total) by mouth at bedtime. 30 tablet 0   ranolazine (RANEXA) 500 MG 12 hr tablet Take 1 tablet (500 mg total) by mouth 2 (two) times daily. 60 tablet 0   ticagrelor (BRILINTA) 90 MG TABS tablet Take 1 tablet (90 mg total) by mouth 2 (two) times daily. 60 tablet 0   tiZANidine (ZANAFLEX) 4 MG tablet Take 1 tablet (4 mg total) by mouth at bedtime. 30 tablet 0   traMADol (ULTRAM) 50 MG tablet Take 1 tablet (50 mg total) by mouth every 6 (six) hours as needed for severe pain. 28 tablet 0   traZODone (DESYREL) 50 MG tablet Take 1 tablet (50 mg total) by mouth at bedtime. 30 tablet 0   No current  facility-administered medications for this visit.     PHYSICAL EXAMINATION: ECOG PERFORMANCE STATUS: 0 - Asymptomatic  Vitals:   03/12/22 1059  BP: 120/76  Pulse: 76  Resp: 16  Temp: 98 F (36.7 C)  SpO2: 100%   Filed Weights   03/12/22 1059  Weight: 197 lb 4.8 oz (89.5 kg)    GENERAL:alert, no distress and comfortable, in a wheelchair Chest: Clear to auscultation bilaterally Lower extremities: No lower extremity edema today. Abdomen: Dexcom in place, no ecchymosis. Neuro Left arm droop noted   LABORATORY DATA:  I have reviewed the data as listed Lab Results  Component Value Date   WBC 9.2 02/15/2022   HGB 9.3 (L) 02/15/2022   HCT 28.9 (L) 02/15/2022   MCV 85.8 02/15/2022   PLT 435 (H) 02/15/2022     Chemistry      Component Value Date/Time   NA 139 02/17/2022 0551   NA 135 11/17/2015 1153   K 3.7 02/17/2022 0551   CL 103 02/17/2022 0551   CO2 25 02/17/2022 0551   BUN 38 (H) 02/17/2022 0551   BUN 29 (H) 11/17/2015 1153   CREATININE 2.53 (H) 02/17/2022 0551      Component Value Date/Time   CALCIUM 9.0 02/17/2022 0551   ALKPHOS 89 02/02/2022 0506   AST 24 02/02/2022 0506   ALT 28 02/02/2022 0506   BILITOT 0.6 02/02/2022 0506       RADIOGRAPHIC STUDIES: I have personally reviewed  the radiological images as listed and agreed with the findings in the report. DG Foot Complete Right  Result Date: 02/12/2022 CLINICAL DATA:  Pain metatarsus of right foot.  Toe pain. EXAM: RIGHT FOOT COMPLETE - 3+ VIEW COMPARISON:  04/03/2019 FINDINGS: No acute fracture. Chronic irregularity about the fourth toe proximal phalanx may represent sequela of remote injury. There is chronic flattening of the third metatarsal head. Chronic fragmentation of the second metatarsal head. No erosions or bony destructive change. No dislocation. Prominent vascular calcifications, age advanced. No soft tissue gas. IMPRESSION: 1. No acute osseous abnormality. 2. Chronic flattening of the third  metatarsal head and fragmentation of the second metatarsal head. Chronic regularity of the fourth toe proximal phalanx may represent sequela of remote injury. Electronically Signed   By: Keith Rake M.D.   On: 02/12/2022 19:42    All questions were answered. The patient knows to call the clinic with any problems, questions or concerns. I spent 30 minutes in the care of this patient including H and P, review of records, counseling and coordination of care.     Benay Pike, MD 03/12/2022 11:13 AM

## 2022-03-13 ENCOUNTER — Other Ambulatory Visit: Payer: Self-pay | Admitting: Nurse Practitioner

## 2022-03-13 ENCOUNTER — Other Ambulatory Visit (HOSPITAL_COMMUNITY): Payer: Self-pay

## 2022-03-15 ENCOUNTER — Ambulatory Visit: Payer: Self-pay | Admitting: *Deleted

## 2022-03-15 ENCOUNTER — Encounter: Payer: Self-pay | Admitting: *Deleted

## 2022-03-15 ENCOUNTER — Other Ambulatory Visit (HOSPITAL_COMMUNITY): Payer: Self-pay

## 2022-03-15 NOTE — Patient Outreach (Signed)
  Care Coordination   Follow Up Visit Note   03/15/2022  Name: Lauren [name] MRN: [id number] DOB: 1980/06/15  Lauren [name] is a 41 y.o. year old female who sees Lemmie Evens, MD for primary care. I spoke with [name] and daughter, Fawn Desrocher by phone today.  What matters to the patients health and wellness today?  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    Goals Addressed               This Visit's Progress     Assist with Applying for Medicaid, [employer] Program Services and [place]. (pt-stated)   On track     Care Coordination Interventions:  Active listening utilized.  Task-centered strategies employed. Caregiver stress acknowledged. Caregiver support provided. Caregiver resources reviewed. Continue to receive home health nursing, physical therapy, occupational therapy and speech therapy services, through [facility]. Continue to await approval/denial letter from the [place], Programme researcher, broadcasting/film/video for Adult Medicaid. Reviewed the following list of agencies and resources, to ensure understanding:             [place] Agency Provider List Community Alternative Program Services Instructions Community Alternative Program Services Application Await completion of Personal Care Services Application and Musician, until approved for Adult [place].       SDOH assessments and interventions completed:  Yes.   Care Coordination Interventions Activated:  Yes.    Care Coordination Interventions:  Yes, provided.    Follow up plan: Follow up call scheduled for 03/29/2022 at 11:15 am.   Encounter Outcome:  Pt. Visit Completed.    Nat Christen, BSW, MSW, LCSW  Licensed Teacher, adult education [facility]  Mailing [place] [address], [place], [place] [address] Physical Address-[address] [place], [place] [address] Toll Free Main # [phone] Fax # [phone] Cell # [phone] [email or web address]

## 2022-03-15 NOTE — Patient Instructions (Signed)
Visit Information  Thank you for taking time to visit with me today. Please don't hesitate to contact me if I can be of assistance to you.   Following are the goals we discussed today:   Goals Addressed               This Visit's Progress     Assist with Applying for Medicaid, [employer] Services and [place]. (pt-stated)   On track     Care Coordination Interventions:  Active listening utilized.  Task-centered strategies employed. Caregiver stress acknowledged. Caregiver support provided. Caregiver resources reviewed. Continue to receive home health nursing, physical therapy, occupational therapy and speech therapy services, through [facility]. Continue to await approval/denial letter from the [place], Programme researcher, broadcasting/film/video for Adult Medicaid. Reviewed the following list of agencies and resources, to ensure understanding:             [place] Agency Provider List Community Alternative Program Services Instructions Community Alternative Program Services Application Await completion of Personal Care Services Application and Musician, until approved for Adult [place].       Our next appointment is by telephone on 03/29/2022 at 11:15 am.  Please call the care guide team at [phone] if you need to cancel or reschedule your appointment.   If you are experiencing a Mental Health or [place] or need someone to talk to, please call the Suicide and Crisis Lifeline: 988 call the [place] National Suicide Prevention Lifeline: 410-267-6915 or TTY: (940)775-4892 TTY 312-688-7298) to talk to a trained counselor call 1-800-273-TALK (toll free, 24 hour hotline) go to [facility] [address], [place]  [phone]) call the [place]: [phone] call 911  Patient verbalizes understanding of instructions and care plan provided today and agrees to view in [place]. Active MyChart status and patient understanding of how to access instructions and care plan via MyChart confirmed with patient.     Telephone follow up appointment with care management team member scheduled for:  03/29/2022 at 11:15 am.  Nat Christen, BSW, MSW, [place]  Licensed Clinical Social Worker  [place]  Mailing [place]. [address], [place], [place] [address] Physical Address-[address] [place], [place] [address] Toll Free Main # [phone] Fax # [phone] Cell # [phone] [email or web address]

## 2022-03-15 NOTE — Telephone Encounter (Signed)
She will need appt first.

## 2022-03-16 ENCOUNTER — Other Ambulatory Visit (HOSPITAL_COMMUNITY): Payer: Self-pay

## 2022-03-16 NOTE — Telephone Encounter (Signed)
I made pt an appt

## 2022-03-17 ENCOUNTER — Encounter (HOSPITAL_COMMUNITY): Payer: Self-pay

## 2022-03-17 ENCOUNTER — Ambulatory Visit: Payer: No Typology Code available for payment source | Admitting: Nurse Practitioner

## 2022-03-17 ENCOUNTER — Other Ambulatory Visit (HOSPITAL_COMMUNITY): Payer: Self-pay

## 2022-03-17 ENCOUNTER — Encounter: Payer: Self-pay | Admitting: Nurse Practitioner

## 2022-03-17 VITALS — BP 122/76 | HR 70 | Ht 65.0 in | Wt 197.0 lb

## 2022-03-17 DIAGNOSIS — E1065 Type 1 diabetes mellitus with hyperglycemia: Secondary | ICD-10-CM

## 2022-03-17 DIAGNOSIS — Z794 Long term (current) use of insulin: Secondary | ICD-10-CM

## 2022-03-17 DIAGNOSIS — I10 Essential (primary) hypertension: Secondary | ICD-10-CM | POA: Diagnosis not present

## 2022-03-17 DIAGNOSIS — E782 Mixed hyperlipidemia: Secondary | ICD-10-CM

## 2022-03-17 MED ORDER — OMNIPOD 5 DEXG7G6 PODS GEN 5 MISC
3 refills | Status: DC
  Filled 2022-03-17: qty 10, 30d supply, fill #0
  Filled 2022-04-10 – 2022-04-19 (×3): qty 10, 30d supply, fill #1

## 2022-03-17 MED ORDER — INSULIN LISPRO 100 UNIT/ML IJ SOLN
INTRAMUSCULAR | 3 refills | Status: DC
  Filled 2022-03-17: qty 60, fill #0
  Filled 2022-03-18 – 2022-03-29 (×2): qty 60, 84d supply, fill #0
  Filled 2022-04-05: qty 60, 85d supply, fill #0

## 2022-03-17 NOTE — Progress Notes (Signed)
Endocrinology Follow Up Note       03/17/2022, 4:43 PM   Subjective:    Patient ID: Lauren [name], female    DOB: 1980/07/16.  Lauren [name] is being seen in follow up after being seen in consultation for management of currently uncontrolled symptomatic diabetes requested by  Lemmie Evens, MD.   Past Medical History:  Diagnosis Date   Anemia    CAD (coronary artery disease)    a. s/p cath in 03/2014 showing 30% mid-LAD, moderate to severe disease along small D1, patent LCx, moderate to severe distal OM2 stenosis and moderate diffuse diease along RCA not amenable to PCI   CHF (congestive heart failure) ([place])    a. EF 55-60% in 12/2019 b. EF at 35-40% by echo in 05/2020   CKD (chronic kidney disease) stage 4, GFR 15-29 ml/min (HCC)    Diabetes mellitus without complication ([place])    Myocardial infarction ([place])    Neuropathy    Stroke [facility])     Past Surgical History:  Procedure Laterality Date   AMPUTATION Left 09/02/2021   Procedure: AMPUTATION RAY;  Surgeon: Criselda Peaches, DPM;  Location: [place];  Service: Podiatry;  Laterality: Left;  sagittal saw, 3L bag saline & Pulse   Cardiac catherization     CHOLECYSTECTOMY     I & D EXTREMITY Left 08/30/2021   Procedure: IRRIGATION AND DEBRIDEMENT WITH BONE BIOPSY;  Surgeon: [name], DPM;  Location: [place];  Service: Podiatry;  Laterality: Left;   IR ANGIO VERTEBRAL SEL SUBCLAVIAN INNOMINATE UNI L MOD SED  01/18/2022   IR CT HEAD LTD  01/08/2022   IR INTRA CRAN STENT  01/08/2022   IR PERCUTANEOUS ART THROMBECTOMY/INFUSION INTRACRANIAL INC DIAG ANGIO  01/08/2022   IRRIGATION AND DEBRIDEMENT FOOT Left 09/04/2021   Procedure: IRRIGATION AND DEBRIDEMENT FOOT AND CLOSURE;  Surgeon: Criselda Peaches, DPM;  Location: [place];  Service: Podiatry;  Laterality: Left;   RADIOLOGY WITH ANESTHESIA N/A 01/08/2022   Procedure: IR WITH ANESTHESIA;  Surgeon: Radiologist, Medication,  MD;  Location: [place];  Service: Radiology;  Laterality: N/A;   THROMBECTOMY BRACHIAL ARTERY Right 11/18/2021   Procedure: RIGHT BRACHIAL, RADIAL, & ULNAR ARTERY THROMBECTOMY.;  Surgeon: Marty Heck, MD;  [facility];  Service: Vascular;  Laterality: Right;   TUBAL LIGATION      Social History   Socioeconomic History   Marital status: Married    Spouse name: Jerelyn Scott   Number of children: 2   Years of education: 12   Highest education level: 12th grade  Occupational History    Comment: Full time   Occupation: CNA at [employer] Use   Smoking status: Former    Packs/day: 0.25    Types: Cigarettes    Quit date: 10/27/2020    Years since quitting: 1.3    Passive exposure: Past   Smokeless tobacco: Never  Vaping Use   Vaping Use: Never used  Substance and Sexual Activity   Alcohol use: No    Alcohol/week: 0.0 standard drinks of alcohol   Drug use: No   Sexual activity: Yes    Birth control/protection: Surgical    Comment: tubal  Other Topics Concern   Not on file  Social History Narrative   Lives with husband and kids   Right handed   Drinks 9+ cups caffeine daily   Social Determinants of Health   Financial Resource Strain: Low Risk  (03/03/2022)   Overall Financial Resource Strain (CARDIA)    Difficulty of Paying Living Expenses: Not very hard  Recent Concern: Financial Resource Strain - High Risk (01/14/2022)   Overall Financial Resource Strain (CARDIA)    Difficulty of Paying Living Expenses: Hard  Food Insecurity: No Food Insecurity (03/03/2022)   Hunger Vital Sign    Worried About Running Out of Food in the Last Year: Never true    Ran Out of Food in the Last Year: Never true  Recent Concern: Food Insecurity - Food Insecurity Present (01/16/2022)   Hunger Vital Sign    Worried About Running Out of Food in the Last Year: Sometimes true    Ran Out of Food in the Last Year: Sometimes true  Transportation Needs: No Transportation Needs (03/03/2022)    PRAPARE - Hydrologist (Medical): No    Lack of Transportation (Non-Medical): No  Physical Activity: Inactive (03/03/2022)   Exercise Vital Sign    Days of Exercise per Week: 0 days    Minutes of Exercise per Session: 0 min  Stress: No Stress Concern Present (03/03/2022)   [place]    Feeling of Stress : Only a little  Recent Concern: Stress - Stress Concern Present (12/22/2021)   [place]    Feeling of Stress : To some extent  Social Connections: Socially Integrated (03/03/2022)   Social Connection and Isolation Panel [NHANES]    Frequency of Communication with Friends and Family: More than three times a week    Frequency of Social Gatherings with Friends and Family: More than three times a week    Attends Religious Services: More than 4 times per year    Active Member of [employer] or Organizations: Yes    Attends Music therapist: More than 4 times per year    Marital Status: Married    Family History  Problem Relation Age of Onset   Thyroid disease Mother    Hypertension Mother    Hyperlipidemia Mother    CAD Mother    CVA Mother    Hyperlipidemia Father    Hypertension Father    CAD Father    Breast cancer Paternal Grandmother 60   Cancer Paternal Grandmother        lung    Outpatient Encounter Medications as of 03/17/2022  Medication Sig   acetaminophen (TYLENOL) 325 MG tablet Take 1-2 tablets (325-650 mg total) by mouth every 4 (four) hours as needed for mild pain.   atorvastatin (LIPITOR) 40 MG tablet Take 1 tablet (40 mg total) by mouth daily.   Continuous Blood Gluc Sensor (DEXCOM G6 SENSOR) MISC Change sensor every 10 days as directed   enoxaparin (LOVENOX) 120 MG/0.8ML injection Inject 0.8 mLs (120 mg total) into the skin daily.   furosemide (LASIX) 40 MG tablet Take 40 mg daily as needed for  swelling   Insulin Disposable Pump (OMNIPOD 5 G6 POD, GEN 5,) MISC Change pod every 48-72 hours   isosorbide mononitrate (ISMO) 20 MG tablet Take 1 tablet (20 mg total) by mouth 2 (two) times daily.   levothyroxine (SYNTHROID) 125 MCG tablet Take 1 tablet (125  mcg total) by mouth daily at 6 (six) AM.   magnesium oxide (MAG-OX) 400 MG tablet Take 0.5 tablets (200 mg total) by mouth at bedtime.   methocarbamol (ROBAXIN) 500 MG tablet Take 1 tablet (500 mg total) by mouth every 6 (six) hours as needed for muscle spasms.   metoprolol tartrate (LOPRESSOR) 25 MG tablet Take 1.5 tablets (37.5 mg total) by mouth 2 (two) times daily.   Multiple Vitamin (MULTIVITAMIN WITH MINERALS) TABS tablet Take 1 tablet by mouth daily.   nicotine (NICODERM CQ - DOSED IN MG/24 HOURS) 21 mg/24hr patch Place 1 patch (21 mg total) onto the skin daily.   nitroGLYCERIN (NITROSTAT) 0.4 MG SL tablet Place 1 tablet (0.4 mg total) under the tongue every 5 (five) minutes x 3 doses as needed for chest pain.   ondansetron (ZOFRAN) 4 MG tablet Take 1 tablet (4 mg total) by mouth every 8 (eight) hours as needed for nausea or vomiting.   pantoprazole (PROTONIX) 40 MG tablet Take 1 tablet (40 mg total) by mouth daily.   polyethylene glycol (MIRALAX / GLYCOLAX) 17 g packet Take 17 g by mouth 2 (two) times daily.   ramelteon (ROZEREM) 8 MG tablet Take 1 tablet (8 mg total) by mouth at bedtime.   ranolazine (RANEXA) 500 MG 12 hr tablet Take 1 tablet (500 mg total) by mouth 2 (two) times daily.   ticagrelor (BRILINTA) 90 MG TABS tablet Take 1 tablet (90 mg total) by mouth 2 (two) times daily.   tiZANidine (ZANAFLEX) 4 MG tablet Take 1 tablet (4 mg total) by mouth at bedtime.   traMADol (ULTRAM) 50 MG tablet Take 1 tablet (50 mg total) by mouth every 6 (six) hours as needed for severe pain.   traZODone (DESYREL) 50 MG tablet Take 1 tablet (50 mg total) by mouth at bedtime.   [DISCONTINUED] insulin lispro (HUMALOG) 100 UNIT/ML injection Use  with Omnipod for TDD around 60 units SQ daily   insulin lispro (HUMALOG) 100 UNIT/ML injection Use with Omnipod for TDD around 70 units SQ daily   No facility-administered encounter medications on file as of 03/17/2022.    ALLERGIES: Allergies  Allergen Reactions   Wellbutrin [Bupropion] Hives   Cefepime Rash    Tolerates penicilllin   Ciprofloxacin Hcl Hives and Rash    Hives/rash at injection site; 01/15/22 tolerated IV cipro   Tape Rash    VACCINATION STATUS: Immunization History  Administered Date(s) Administered   Influenza-Unspecified 05/01/2021   Moderna Sars-Covid-2 Vaccination 09/23/2019, 10/28/2019    Diabetes She presents for her follow-up diabetic visit. She has type 1 diabetes mellitus. Onset time: diagnosed at age of 29. Her disease course has been fluctuating. There are no hypoglycemic associated symptoms. Pertinent negatives for hypoglycemia include no nervousness/anxiousness. Associated symptoms include blurred vision, fatigue, polydipsia, polyuria and weight loss. There are no hypoglycemic complications. Symptoms are stable. Diabetic complications include a CVA, heart disease, nephropathy and PVD. Risk factors for coronary artery disease include diabetes mellitus, dyslipidemia, family history, obesity, hypertension and tobacco exposure. Current diabetic treatment includes insulin pump. She is compliant with treatment most of the time. Her weight is fluctuating minimally. She is following a generally unhealthy diet. When asked about meal planning, she reported none. She has not had a previous visit with a dietitian. She rarely participates in exercise. Her overall blood glucose range is 180-200 mg/dl. (She presents today, accompanied by her daughter, with her CGM and Omnipod showing slightly above target glycemic profile overall.  Her recent A1c on  9/9 was 8%.  This was checked during hospitalization for stroke.  She went through rehabilitation program at [facility] and is now at  home.  She has restarted using her Omnipod and Dexcom combo but says her supplies had all been discontinued and she is using her last pod.  Analysis of her CGM shows TIR 54%, TAR 45%, TBR <1% with a GMI of 8%.  She reports she has not done too well with her diet since being discharged from rehab, has consumed foods she had been craving.  It also appears that she has not been bolusing enough for her meals as she tends to have postprandial spikes.  ) An ACE inhibitor/angiotensin II receptor blocker is not being taken. She does not see a podiatrist.Eye exam is current.  Hypertension This is a chronic problem. The current episode started more than 1 year ago. The problem is unchanged. The problem is controlled. Associated symptoms include blurred vision. Agents associated with hypertension include thyroid hormones. Risk factors for coronary artery disease include diabetes mellitus, dyslipidemia, family history and smoking/tobacco exposure. Past treatments include beta blockers, diuretics and direct vasodilators. The current treatment provides moderate improvement. Compliance problems include diet and exercise.  Hypertensive end-organ damage includes kidney disease, CAD/MI, CVA, heart failure and PVD. Identifiable causes of hypertension include chronic renal disease and a thyroid problem.  Hyperlipidemia This is a chronic problem. The current episode started more than 1 year ago. The problem is controlled. Recent lipid tests were reviewed and are normal. Exacerbating diseases include chronic renal disease, diabetes, hypothyroidism and obesity. Factors aggravating her hyperlipidemia include beta blockers and fatty foods. Current antihyperlipidemic treatment includes statins and fibric acid derivatives. The current treatment provides moderate improvement of lipids. Compliance problems include adherence to diet and adherence to exercise.  Risk factors for coronary artery disease include diabetes mellitus, dyslipidemia,  family history, obesity and hypertension.  Thyroid Problem Presents for initial visit. Symptoms include fatigue and weight loss. Patient reports no anxiety. The symptoms have been stable. Past treatments include levothyroxine. Her past medical history is significant for diabetes, heart failure, hyperlipidemia and obesity. Risk factors include family history of hypothyroidism.     Review of systems  Constitutional: + stable body weight, current Body mass index is 32.78 kg/m., + fatigue, no subjective hyperthermia, no subjective hypothermia Eyes: + blurry vision, no xerophthalmia ENT: no sore throat, no nodules palpated in throat, no dysphagia/odynophagia, no hoarseness Cardiovascular: no chest pain, no shortness of breath, no palpitations, no leg swelling Respiratory: no cough, no shortness of breath Gastrointestinal: no nausea/vomiting/diarrhea Musculoskeletal: no muscle/joint aches, WC bound due to recent stroke affecting left side, left foot in ortho boot Skin: no rashes, no hyperemia Neurological: no tremors, no numbness, no tingling, no dizziness Psychiatric: no depression, no anxiety  Objective:     BP 122/76 (BP Location: Right Arm, Patient Position: Sitting, Cuff Size: Large)   Pulse 70   Ht 5\' 5"  (1.651 m)   Wt 197 lb (89.4 kg) Comment: Per Patient  BMI 32.78 kg/m   Wt Readings from Last 3 Encounters:  03/17/22 197 lb (89.4 kg)  03/12/22 197 lb 4.8 oz (89.5 kg)  03/05/22 191 lb (86.6 kg)     BP Readings from Last 3 Encounters:  03/17/22 122/76  03/12/22 120/76  03/05/22 136/76      Physical Exam- Limited  Constitutional:  Body mass index is 32.78 kg/m. , not in acute distress, normal state of mind Eyes:  EOMI, no exophthalmos Neck: Supple Cardiovascular: RRR,  no murmurs, rubs, or gallops, +localized edema to L hand Respiratory: Adequate breathing efforts, no crackles, rales, rhonchi, or wheezing Musculoskeletal: WC bound due to recent stroke affecting left  side.  Left arm ataxic, hand edematous,  Left foot in ortho boot Skin:  no rashes, no hyperemia Neurological: no tremor with outstretched hands    CMP ( most recent) CMP     Component Value Date/Time   NA 139 02/17/2022 0551   NA 135 11/17/2015 1153   K 3.7 02/17/2022 0551   CL 103 02/17/2022 0551   CO2 25 02/17/2022 0551   GLUCOSE 88 02/17/2022 0551   BUN 38 (H) 02/17/2022 0551   BUN 29 (H) 11/17/2015 1153   CREATININE 2.53 (H) 02/17/2022 0551   CALCIUM 9.0 02/17/2022 0551   PROT 5.4 (L) 02/02/2022 0506   ALBUMIN 2.4 (L) 02/02/2022 0506   ALBUMIN 3.2 (L) 11/30/2021 1016   AST 24 02/02/2022 0506   ALT 28 02/02/2022 0506   ALKPHOS 89 02/02/2022 0506   BILITOT 0.6 02/02/2022 0506   GFRNONAA 24 (L) 02/17/2022 0551   GFRAA 31 (L) 02/01/2020 1328     Diabetic Labs (most recent): Lab Results  Component Value Date   HGBA1C 8.0 (H) 01/09/2022   HGBA1C 9.6 09/29/2021   HGBA1C 9.8 (H) 06/19/2021   MICROALBUR 150 10/19/2021     Lipid Panel ( most recent) Lipid Panel     Component Value Date/Time   CHOL 85 01/09/2022 0503   TRIG 197 (H) 01/16/2022 0403   HDL 15 (L) 01/09/2022 0503   CHOLHDL 5.7 01/09/2022 0503   VLDL 31 01/09/2022 0503   LDLCALC 39 01/09/2022 0503   LDLDIRECT 84.9 01/01/2020 0319      Lab Results  Component Value Date   TSH 41.777 (H) 01/28/2022   TSH 25.117 (H) 08/28/2021   TSH 0.268 (L) 06/13/2020   TSH 5.860 (H) 11/17/2015   TSH 33.130 (H) 07/21/2015   TSH 40.980 (H) 03/07/2015   FREET4 1.52 11/17/2015   FREET4 0.73 (L) 07/21/2015   FREET4 0.77 (L) 03/07/2015           Assessment & Plan:   1) Type 1 diabetes mellitus with hyperglycemia ([place])  She presents today, accompanied by her daughter, with her CGM and Omnipod showing slightly above target glycemic profile overall.  Her recent A1c on 9/9 was 8%.  This was checked during hospitalization for stroke.  She went through rehabilitation program at [facility] and is now at home.  She has  restarted using her Omnipod and Dexcom combo but says her supplies had all been discontinued and she is using her last pod.  Analysis of her CGM shows TIR 54%, TAR 45%, TBR <1% with a GMI of 8%.  She reports she has not done too well with her diet since being discharged from rehab, has consumed foods she had been craving.  It also appears that she has not been bolusing enough for her meals as she tends to have postprandial spikes.   - Lauren [name] has currently uncontrolled symptomatic type 1 DM since 41 years of age.   -Recent labs reviewed.  - I had a long discussion with her about the progressive nature of diabetes and the pathology behind its complications. -her diabetes is complicated by CAD, CVA, CKD, PVD, and CHF and she remains at a high risk for more acute and chronic complications which include CAD, CVA, CKD, retinopathy, and neuropathy. These are all discussed in detail with her.  The following  Lifestyle Medicine recommendations according to [place] of Lifestyle Medicine [facility]) were discussed and offered to patient and she agrees to start the journey:  A. Whole Foods, Plant-based plate comprising of fruits and vegetables, plant-based proteins, whole-grain carbohydrates was discussed in detail with the patient.   A list for source of those nutrients were also provided to the patient.  Patient will use only water or unsweetened tea for hydration. B.  The need to stay away from risky substances including alcohol, smoking; obtaining 7 to 9 hours of restorative sleep, at least 150 minutes of moderate intensity exercise weekly, the importance of healthy social connections,  and stress reduction techniques were discussed. C.  A full color page of  Calorie density of various food groups per pound showing examples of each food groups was provided to the patient.  - Nutritional counseling repeated at each appointment due to patients tendency to fall back in to old habits.  - The patient  admits there is a room for improvement in their diet and drink choices. -  Suggestion is made for the patient to avoid simple carbohydrates from their diet including Cakes, Sweet Desserts / Pastries, Ice Cream, Soda (diet and regular), Sweet Tea, Candies, Chips, Cookies, Sweet Pastries, Store Bought Juices, Alcohol in Excess of 1-2 drinks a day, Artificial Sweeteners, Coffee Creamer, and "Sugar-free" Products. This will help patient to have stable blood glucose profile and potentially avoid unintended weight gain.   - I encouraged the patient to switch to unprocessed or minimally processed complex starch and increased protein intake (animal or plant source), fruits, and vegetables.   - Patient is advised to stick to a routine mealtimes to eat 3 meals a day and avoid unnecessary snacks (to snack only to correct hypoglycemia).  - I have approached her with the following individualized plan to manage her diabetes and patient agrees:   Given her type 1 diagnosis, insulin is the only effective evidence-based treatment option.  She is advised to continue her current Omnipod 5 pump settings but to be more consistent with premeal boluses to prevent postprandial hyperglycemia.  I did send in scripts for her pods and insulin today.  -she is encouraged to start monitoring glucose 4 times daily (using her CGM), before meals and before bed and to call the clinic if she has readings less than 70 or above 300 for 3 tests in a row.  - she is warned not to take insulin without proper monitoring per orders. - Adjustment parameters are given to her for hypo and hyperglycemia in writing.  - Specific targets for  A1c; LDL, HDL, and Triglycerides were discussed with the patient.  2) Blood Pressure /Hypertension:  her blood pressure is controlled to target.   she is advised to continue her current medications including Lasix 40 mg po daily as needed and Metoprolol 37.5 mg po twice daily.  3) Lipids/Hyperlipidemia:     Review of her recent lipid panel from 01/16/22 showed controlled LDL at 31 and elevated triglycerides of 154 .  she is advised to continue Lipitor 40 mg daily at bedtime.  4)  Weight/Diet:  her Body mass index is 32.78 kg/m.  -  clearly complicating her diabetes care.   she is a candidate for weight loss. I discussed with her the fact that loss of 5 - 10% of her  current body weight will have the most impact on her diabetes management.  Exercise, and detailed carbohydrates information provided  -  detailed on discharge instructions.  5) Hypothyroidism-unspecified The details surrounding her diagnosis are unavailable.  She is currently on Levothyroxine 125.  Will recheck TFTs on subsequent visits.   - The correct intake of thyroid hormone (Levothyroxine, Synthroid), is on empty stomach first thing in the morning, with water, separated by at least 30 minutes from breakfast and other medications,  and separated by more than 4 hours from calcium, iron, multivitamins, acid reflux medications (PPIs).  - This medication is a life-long medication and will be needed to correct thyroid hormone imbalances for the rest of your life.  The dose may change from time to time, based on thyroid blood work.  - It is extremely important to be consistent taking this medication, near the same time each morning.  -AVOID TAKING PRODUCTS CONTAINING BIOTIN (commonly found in Hair, Skin, Nails vitamins) AS IT INTERFERES WITH THE VALIDITY OF THYROID FUNCTION BLOOD TESTS.  6) Chronic Care/Health Maintenance: -she is not on ACEI/ARB and is on Statin medications and is encouraged to initiate and continue to follow up with Ophthalmology, Dentist, Podiatrist at least yearly or according to recommendations, and advised to stay away from smoking. I have recommended yearly flu vaccine and pneumonia vaccine at least every 5 years; moderate intensity exercise for up to 150 minutes weekly; and sleep for at least 7 hours a day.  - she  is advised to maintain close follow up with Lemmie Evens, MD for primary care needs, as well as her other providers for optimal and coordinated care.     I spent 30 minutes in the care of the patient today including review of labs from [place], Lipids, Thyroid Function, Hematology (current and previous including abstractions from other facilities); face-to-face time discussing  her blood glucose readings/logs, discussing hypoglycemia and hyperglycemia episodes and symptoms, medications doses, her options of short and long term treatment based on the latest standards of care / guidelines;  discussion about incorporating lifestyle medicine;  and documenting the encounter. Risk reduction counseling performed per USPSTF guidelines to reduce obesity and cardiovascular risk factors.     Please refer to Patient Instructions for Blood Glucose Monitoring and Insulin/Medications Dosing Guide"  in media tab for additional information. Please  also refer to " Patient Self Inventory" in the Media  tab for reviewed elements of pertinent patient history.  Lauren [name] participated in the discussions, expressed understanding, and voiced agreement with the above plans.  All questions were answered to her satisfaction. she is encouraged to contact clinic should she have any questions or concerns prior to her return visit.     Follow up plan: - Return in about 3 months (around 06/17/2022) for Diabetes F/U with A1c in office, No previsit labs, Bring meter and logs.   Rayetta Pigg, [facility] [address] [place], [place] [address] Phone: [phone] Fax: [phone]  03/17/2022, 4:43 PM

## 2022-03-18 ENCOUNTER — Ambulatory Visit: Payer: No Typology Code available for payment source | Admitting: Podiatry

## 2022-03-18 ENCOUNTER — Other Ambulatory Visit (HOSPITAL_COMMUNITY): Payer: Self-pay

## 2022-03-18 DIAGNOSIS — E10621 Type 1 diabetes mellitus with foot ulcer: Secondary | ICD-10-CM | POA: Diagnosis not present

## 2022-03-18 DIAGNOSIS — L97425 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 without evidence of necrosis: Secondary | ICD-10-CM | POA: Diagnosis not present

## 2022-03-19 ENCOUNTER — Telehealth: Payer: Self-pay | Admitting: Cardiology

## 2022-03-19 ENCOUNTER — Other Ambulatory Visit: Payer: Self-pay | Admitting: Physical Medicine and Rehabilitation

## 2022-03-19 ENCOUNTER — Other Ambulatory Visit (HOSPITAL_COMMUNITY): Payer: Self-pay

## 2022-03-19 MED ORDER — METOPROLOL TARTRATE 25 MG PO TABS
37.5000 mg | ORAL_TABLET | Freq: Two times a day (BID) | ORAL | 2 refills | Status: DC
  Filled 2022-03-19: qty 270, 90d supply, fill #0

## 2022-03-19 NOTE — Telephone Encounter (Signed)
*  STAT* If patient is at the pharmacy, call can be transferred to refill team.   1. Which medications need to be refilled? (please list name of each medication and dose if known) metoprolol tartrate (LOPRESSOR) 25 MG tablet   2. Which pharmacy/location (including street and city if local pharmacy) is medication to be sent to [place]   3. Do they need a 30 day or 90 day supply? [place]

## 2022-03-19 NOTE — Telephone Encounter (Signed)
Refilled,changed pharmacy to [facility]

## 2022-03-20 ENCOUNTER — Other Ambulatory Visit (HOSPITAL_COMMUNITY): Payer: Self-pay

## 2022-03-22 ENCOUNTER — Other Ambulatory Visit (HOSPITAL_COMMUNITY): Payer: Self-pay

## 2022-03-22 ENCOUNTER — Encounter: Payer: Self-pay | Admitting: Podiatry

## 2022-03-22 NOTE — Progress Notes (Signed)
  Subjective:  Patient ID: Lauren [name], female    DOB: August 02, 1980,  MRN: [id number]  Chief Complaint  Patient presents with   wound care    Patient is here for left foot wound care.    41 y.o. female presents with the above complaint. History confirmed with patient.  She is doing well they have been changing with Iodosorb daily  Objective:  Physical Exam: warm, good capillary refill and weakly palpable pulses, minimal sensation, full thickness wound measuring 1.1x0.4 x 0.5cm with exposed subcutaneous tissue no s/o infection      Assessment:   1. 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   Ulcer left foot -We discussed the etiology and factors that are a part of the wound healing process.  We also discussed the risk of infection both soft tissue and osteomyelitis from open ulceration.  Discussed the risk of limb loss if this happens or worsens. -Debridement as below. -Dressed with iodosorb, DSD. -Continue home dressing changes daily with 4 x 4 gauze and iodosorb -Continue off-loading with surgical shoe. -Wound continues to improve.  Continue current course of treatment  Procedure: Excisional Debridement of Wound Rationale: Removal of non-viable soft tissue from the wound to promote healing.  Anesthesia: none Post-Debridement Wound Measurements: 1.1x0.4 cm x 0.5 cm  Type of Debridement: Sharp Excisional Tissue Removed: Non-viable soft tissue Depth of Debridement: subcutaneous tissue. Technique: Sharp excisional debridement to bleeding, viable wound base.  Dressing: Dry, sterile, compression dressing. Disposition: Patient tolerated procedure well.          Return in about 3 weeks (around 04/08/2022) for wound care.

## 2022-03-23 ENCOUNTER — Other Ambulatory Visit (HOSPITAL_COMMUNITY): Payer: Self-pay

## 2022-03-29 ENCOUNTER — Ambulatory Visit: Payer: Self-pay | Admitting: *Deleted

## 2022-03-29 ENCOUNTER — Encounter: Payer: Self-pay | Admitting: *Deleted

## 2022-03-29 ENCOUNTER — Other Ambulatory Visit (HOSPITAL_COMMUNITY): Payer: Self-pay

## 2022-03-29 NOTE — Patient Outreach (Signed)
  Care Coordination   Follow Up Visit Note   03/29/2022  Name: Lauren Lauren [name] MRN: [id number] DOB: Mar 24, 1981  Lauren Lauren [name] is a 41 y.o. year old female who sees Lauren Evens, MD for primary care. I spoke with Lauren Lauren [name] by phone today.  What matters to the patients health and wellness today?   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   Goals Addressed               This Visit's Progress     Assist with Applying for Medicaid, [employer] and [place]. (pt-stated)   On track     Care Coordination Interventions:  Task-centered strategies employed. Emotional and caregiver support provided. Caregiver stress acknowledged. Self-enrollment in caregiver support group encouraged, from list provided. Continue to receive home health nursing, physical therapy, occupational therapy and speech therapy services, through [facility]. Regret to Lauren [name] that you have been denied for Adult Medicaid, through the [place], due to household income exceeding poverty guidelines.  CSW will assist with re-applying for Adult Medicaid, through the [place], on 04/02/2022, when [facility] Expansion goes into effect. Await approval/denial letter from [employer], Programme researcher, broadcasting/film/video for [place].   Continue to monitor blood pressure and blood sugars, as recommended by Primary Care Provider, Dr. Lemmie [name]. Attend pharmacy visit with pharmacist at [facility], scheduled on 03/29/2022.      SDOH assessments and interventions completed:  Yes.  Care Coordination Interventions:  Yes, provided.   Follow up plan: Follow up call scheduled for 04/12/2022 at 11:15 am.  Encounter Outcome:  Pt. Visit Completed.   Lauren Lauren [name], MSW, LCSW   Licensed Education officer, [facility]  Mailing [place] [address], [place], [place] [address] Physical Address-[address], [place], [place] [address] Toll Free Main # [phone] Fax # [phone] Cell # [phone] [email or web address]

## 2022-03-29 NOTE — Patient Instructions (Signed)
Visit Information  Thank you for taking time to visit with me today. Please don't hesitate to contact me if I can be of assistance to you.   Following are the goals we discussed today:   Goals Addressed               This Visit's Progress     Assist with Applying for Medicaid, [employer] and [place]. (pt-stated)   On track     Care Coordination Interventions:  Task-centered strategies employed. Emotional and caregiver support provided. Caregiver stress acknowledged. Self-enrollment in caregiver support group encouraged, from list provided. Continue to receive home health nursing, physical therapy, occupational therapy and speech therapy services, through [facility]. Regret to learn that you have been denied for Adult Medicaid, through the [place], due to household income exceeding poverty guidelines.  CSW will assist with re-applying for Adult Medicaid, through the [place], on 04/02/2022, when [facility] Expansion goes into effect. Await approval/denial letter from [employer], Programme researcher, broadcasting/film/video for [place].   Continue to monitor blood pressure and blood sugars, as recommended by Primary Care Provider, Dr. Lemmie Evens. Attend pharmacy visit with pharmacist at [facility], scheduled on 03/29/2022.      Our next appointment is by telephone on 04/12/2022 at 11:15 am.  Please call the care guide team at [phone] if you need to cancel or reschedule your appointment.   If you are experiencing a Mental Health or [place] or need someone to talk to, please call the Suicide and Crisis Lifeline: 988 call the [place] National Suicide Prevention Lifeline: 512-273-3834 or TTY: 534-743-9340 TTY 331-256-9368) to talk to a trained counselor call 1-800-273-TALK (toll free, 24  hour hotline) go to [facility] [address], [place] [phone]) call the [place]: [phone] call 911  Patient verbalizes understanding of instructions and care plan provided today and agrees to view in [place]. Active MyChart status and patient understanding of how to access instructions and care plan via MyChart confirmed with patient.     Telephone follow up appointment with care management team member scheduled for:  04/12/2022 at 11:15 am.  Nat Christen, BSW, MSW, [place]  Licensed Clinical Social Worker  [place]  Mailing [place]. [address], [place], [place] [address] Physical Address-[address] [place], [place] [address] Toll Free Main # [phone] Fax # [phone] Cell # [phone] [email or web address]

## 2022-03-30 ENCOUNTER — Inpatient Hospital Stay: Payer: No Typology Code available for payment source | Admitting: Hematology and Oncology

## 2022-03-31 ENCOUNTER — Ambulatory Visit: Payer: No Typology Code available for payment source | Admitting: Internal Medicine

## 2022-04-02 ENCOUNTER — Other Ambulatory Visit: Payer: Self-pay

## 2022-04-02 ENCOUNTER — Other Ambulatory Visit: Payer: Self-pay | Admitting: *Deleted

## 2022-04-02 ENCOUNTER — Inpatient Hospital Stay: Payer: BC Managed Care – PPO | Attending: Hematology and Oncology

## 2022-04-02 DIAGNOSIS — I639 Cerebral infarction, unspecified: Secondary | ICD-10-CM

## 2022-04-05 ENCOUNTER — Ambulatory Visit (INDEPENDENT_AMBULATORY_CARE_PROVIDER_SITE_OTHER): Payer: BC Managed Care – PPO | Admitting: Internal Medicine

## 2022-04-05 ENCOUNTER — Encounter: Payer: Self-pay | Admitting: Internal Medicine

## 2022-04-05 ENCOUNTER — Other Ambulatory Visit (HOSPITAL_COMMUNITY): Payer: Self-pay

## 2022-04-05 VITALS — BP 116/74 | HR 80 | Ht 65.0 in | Wt 196.4 lb

## 2022-04-05 DIAGNOSIS — E1022 Type 1 diabetes mellitus with diabetic chronic kidney disease: Secondary | ICD-10-CM

## 2022-04-05 DIAGNOSIS — Z1159 Encounter for screening for other viral diseases: Secondary | ICD-10-CM | POA: Diagnosis not present

## 2022-04-05 DIAGNOSIS — M25512 Pain in left shoulder: Secondary | ICD-10-CM

## 2022-04-05 DIAGNOSIS — E039 Hypothyroidism, unspecified: Secondary | ICD-10-CM

## 2022-04-05 DIAGNOSIS — I639 Cerebral infarction, unspecified: Secondary | ICD-10-CM | POA: Diagnosis not present

## 2022-04-05 DIAGNOSIS — I70228 Atherosclerosis of native arteries of extremities with rest pain, other extremity: Secondary | ICD-10-CM

## 2022-04-05 DIAGNOSIS — I63511 Cerebral infarction due to unspecified occlusion or stenosis of right middle cerebral artery: Secondary | ICD-10-CM

## 2022-04-05 DIAGNOSIS — I10 Essential (primary) hypertension: Secondary | ICD-10-CM | POA: Diagnosis not present

## 2022-04-05 DIAGNOSIS — L97529 Non-pressure chronic ulcer of other part of left foot with unspecified severity: Secondary | ICD-10-CM

## 2022-04-05 DIAGNOSIS — I6521 Occlusion and stenosis of right carotid artery: Secondary | ICD-10-CM

## 2022-04-05 DIAGNOSIS — Z0001 Encounter for general adult medical examination with abnormal findings: Secondary | ICD-10-CM | POA: Diagnosis not present

## 2022-04-05 DIAGNOSIS — E1059 Type 1 diabetes mellitus with other circulatory complications: Secondary | ICD-10-CM

## 2022-04-05 DIAGNOSIS — I2510 Atherosclerotic heart disease of native coronary artery without angina pectoris: Secondary | ICD-10-CM

## 2022-04-05 DIAGNOSIS — G4700 Insomnia, unspecified: Secondary | ICD-10-CM

## 2022-04-05 DIAGNOSIS — E11621 Type 2 diabetes mellitus with foot ulcer: Secondary | ICD-10-CM

## 2022-04-05 DIAGNOSIS — I5020 Unspecified systolic (congestive) heart failure: Secondary | ICD-10-CM

## 2022-04-05 DIAGNOSIS — N184 Chronic kidney disease, stage 4 (severe): Secondary | ICD-10-CM

## 2022-04-05 MED ORDER — METHOCARBAMOL 500 MG PO TABS: 500.0000 mg | ORAL_TABLET | Freq: Four times a day (QID) | ORAL | 0 refills | Status: DC | PRN

## 2022-04-05 MED ORDER — TICAGRELOR 90 MG PO TABS: 90.0000 mg | ORAL_TABLET | Freq: Two times a day (BID) | ORAL | 2 refills | Status: DC

## 2022-04-05 MED ORDER — TRAMADOL HCL 50 MG PO TABS: 50.0000 mg | ORAL_TABLET | Freq: Four times a day (QID) | ORAL | 0 refills | Status: AC | PRN

## 2022-04-05 NOTE — Patient Instructions (Signed)
It was a pleasure to see you today.  Thank you for giving [name] the opportunity to be involved in your care.  Below is a brief recap of your visit and next steps.  We will plan to see you again in 4 weeks.  Summary You have established care today We will check labs one morning this week I have refilled Brilinta, Robaxin, and Tramadol today I have placed a referral to nephrology

## 2022-04-05 NOTE — Progress Notes (Unsigned)
New Patient Office Visit  Subjective    Patient ID: Lauren [name], female    DOB: Sep 29, 1980  Age: 41 y.o. MRN: [id number]  CC:  Chief Complaint  Patient presents with   Establish Care   HPI Lauren [name] presents to establish care.  She is a 41 year old woman with an extensive past medical history notable for recent right MCA CVA (09/20023), right upper extremity ischemia, T1DM, CKD 4, HTN, HLD, hypothyroidism, multivessel CAD, HFrEF, and hypercoagulable state.  Her previous PCP was Dr. Karie Kirks.  Ms. Top presented to [facility] on 9/8 with left-sided facial droop, left arm and leg weakness, and dysarthria.  She was subsequently found to have a right MCA and vertebral artery occlusions.  She is not a candidate for thrombolytic therapy due to being on Eliquis, but she was taken for thrombectomy with right ICA dissection repair and stent placement.  She was unabated during her procedure due to respiratory failure and transferred to ICU post procedurally.  Ms. Halls was eventually extubated and discharged to the rehab facility on 9/28.  Of note, during her hospital admission, she also underwent biopsy of the fourth digit of her left foot due to concern for osteomyelitis.  She is followed by podiatry for management of this issue.  Ms. Spiller was discharged home from rehabilitation on 10/20.  Since hospital discharge she has been seen in follow-up by cardiology, hematology, endocrinology, and podiatry.  Her acute concern today is requesting refills of tramadol and Robaxin.  She does not endorse any specific symptoms currently..  Acute concerns, chronic medical conditions, and outstanding preventative care items discussed today are individually addressed in A/P below.  Outpatient Encounter Medications as of 04/05/2022  Medication Sig   acetaminophen (TYLENOL) 325 MG tablet Take 1-2 tablets (325-650 mg total) by mouth every 4 (four) hours as needed for mild pain.   atorvastatin  (LIPITOR) 40 MG tablet Take 1 tablet (40 mg total) by mouth daily.   Continuous Blood Gluc Sensor (DEXCOM G6 SENSOR) MISC Change sensor every 10 days as directed   enoxaparin (LOVENOX) 120 MG/0.8ML injection Inject 0.8 mLs (120 mg total) into the skin daily.   furosemide (LASIX) 40 MG tablet Take 40 mg daily as needed for swelling   Insulin Disposable Pump (OMNIPOD 5 G6 POD, GEN 5,) MISC Change pod every 48-72 hours   insulin lispro (HUMALOG) 100 UNIT/ML injection Use with Omnipod for TDD around 70 units SQ daily   isosorbide mononitrate (ISMO) 20 MG tablet Take 1 tablet (20 mg total) by mouth 2 (two) times daily.   levothyroxine (SYNTHROID) 125 MCG tablet Take 1 tablet (125 mcg total) by mouth daily at 6 (six) AM.   magnesium oxide (MAG-OX) 400 MG tablet Take 0.5 tablets (200 mg total) by mouth at bedtime.   metoprolol tartrate (LOPRESSOR) 25 MG tablet Take 1.5 tablets (37.5 mg total) by mouth 2 (two) times daily.   Multiple Vitamin (MULTIVITAMIN WITH MINERALS) TABS tablet Take 1 tablet by mouth daily.   nicotine (NICODERM CQ - DOSED IN MG/24 HOURS) 21 mg/24hr patch Place 1 patch (21 mg total) onto the skin daily.   nitroGLYCERIN (NITROSTAT) 0.4 MG SL tablet Place 1 tablet (0.4 mg total) under the tongue every 5 (five) minutes x 3 doses as needed for chest pain.   ondansetron (ZOFRAN) 4 MG tablet Take 1 tablet (4 mg total) by mouth every 8 (eight) hours as needed for nausea or vomiting.   pantoprazole (PROTONIX) 40 MG tablet Take 1 tablet (  40 mg total) by mouth daily.   polyethylene glycol (MIRALAX / GLYCOLAX) 17 g packet Take 17 g by mouth 2 (two) times daily.   ramelteon (ROZEREM) 8 MG tablet Take 1 tablet (8 mg total) by mouth at bedtime.   ranolazine (RANEXA) 500 MG 12 hr tablet Take 1 tablet (500 mg total) by mouth 2 (two) times daily.   ticagrelor (BRILINTA) 90 MG TABS tablet Take 1 tablet (90 mg total) by mouth 2 (two) times daily.   tiZANidine (ZANAFLEX) 4 MG tablet Take 1 tablet (4 mg  total) by mouth at bedtime.   traZODone (DESYREL) 50 MG tablet Take 1 tablet (50 mg total) by mouth at bedtime.   [DISCONTINUED] methocarbamol (ROBAXIN) 500 MG tablet Take 1 tablet (500 mg total) by mouth every 6 (six) hours as needed for muscle spasms.   [DISCONTINUED] traMADol (ULTRAM) 50 MG tablet Take 1 tablet (50 mg total) by mouth every 6 (six) hours as needed for severe pain.   methocarbamol (ROBAXIN) 500 MG tablet Take 1 tablet (500 mg total) by mouth every 6 (six) hours as needed for muscle spasms.   traMADol (ULTRAM) 50 MG tablet Take 1 tablet (50 mg total) by mouth every 6 (six) hours as needed for up to 7 days for severe pain.   No facility-administered encounter medications on file as of 04/05/2022.    Past Medical History:  Diagnosis Date   Anemia    CAD (coronary artery disease)    a. s/p cath in 03/2014 showing 30% mid-LAD, moderate to severe disease along small D1, patent LCx, moderate to severe distal OM2 stenosis and moderate diffuse diease along RCA not amenable to PCI   CHF (congestive heart failure) ([place])    a. EF 55-60% in 12/2019 b. EF at 35-40% by echo in 05/2020   CKD (chronic kidney disease) stage 4, GFR 15-29 ml/min (HCC)    Diabetes mellitus without complication ([place])    Myocardial infarction ([place])    Neuropathy    Stroke [facility])     Past Surgical History:  Procedure Laterality Date   AMPUTATION Left 09/02/2021   Procedure: AMPUTATION RAY;  Surgeon: Criselda Peaches, DPM;  Location: [facility];  Service: Podiatry;  Laterality: Left;  sagittal saw, 3L bag saline & Pulse   Cardiac catherization     CHOLECYSTECTOMY     I & D EXTREMITY Left 08/30/2021   Procedure: IRRIGATION AND DEBRIDEMENT WITH BONE BIOPSY;  Surgeon: [name], DPM;  Location: [facility];  Service: Podiatry;  Laterality: Left;   IR ANGIO VERTEBRAL SEL SUBCLAVIAN INNOMINATE UNI L MOD SED  01/18/2022   IR CT HEAD LTD  01/08/2022   IR INTRA CRAN STENT  01/08/2022   IR PERCUTANEOUS ART THROMBECTOMY/INFUSION  INTRACRANIAL INC DIAG ANGIO  01/08/2022   IRRIGATION AND DEBRIDEMENT FOOT Left 09/04/2021   Procedure: IRRIGATION AND DEBRIDEMENT FOOT AND CLOSURE;  Surgeon: Criselda Peaches, DPM;  Location: [facility];  Service: Podiatry;  Laterality: Left;   RADIOLOGY WITH ANESTHESIA N/A 01/08/2022   Procedure: IR WITH ANESTHESIA;  Surgeon: Radiologist, Medication, MD;  Location: [facility];  Service: Radiology;  Laterality: N/A;   THROMBECTOMY BRACHIAL ARTERY Right 11/18/2021   Procedure: RIGHT BRACHIAL, RADIAL, & ULNAR ARTERY THROMBECTOMY.;  Surgeon: Marty Heck, MD;  Location: [facility];  Service: Vascular;  Laterality: Right;   TUBAL LIGATION      Family History  Problem Relation Age of Onset   Thyroid disease Mother    Hypertension Mother  Hyperlipidemia Mother    CAD Mother    CVA Mother    Hyperlipidemia Father    Hypertension Father    CAD Father    Breast cancer Paternal Grandmother 65   Cancer Paternal Grandmother        lung    Social History   Socioeconomic History   Marital status: Married    Spouse name: Jerelyn Scott   Number of children: 2   Years of education: 12   Highest education level: 12th grade  Occupational History    Comment: Full time   Occupation: CNA at [employer] Use   Smoking status: Former    Packs/day: 0.25    Types: Cigarettes    Quit date: 10/27/2020    Years since quitting: 1.4    Passive exposure: Past   Smokeless tobacco: Never  Vaping Use   Vaping Use: Never used  Substance and Sexual Activity   Alcohol use: No    Alcohol/week: 0.0 standard drinks of alcohol   Drug use: No   Sexual activity: Yes    Birth control/protection: Surgical    Comment: tubal   Other Topics Concern   Not on file  Social History Narrative   Lives with husband and kids   Right handed   Drinks 9+ cups caffeine daily   Social Determinants of Health   Financial Resource Strain: Low Risk  (03/03/2022)   Overall Financial Resource Strain (CARDIA)    Difficulty of Paying  Living Expenses: Not very hard  Recent Concern: Emergency planning/management officer Strain - High Risk (01/14/2022)   Overall Financial Resource Strain (CARDIA)    Difficulty of Paying Living Expenses: Hard  Food Insecurity: No Food Insecurity (03/03/2022)   Hunger Vital Sign    Worried About Running Out of Food in the Last Year: Never true    [place] in the Last Year: Never true  Recent Concern: [place] Present (01/16/2022)   Hunger Vital Sign    Worried About [place] in the Last Year: Sometimes true    Ran Out of Food in the Last Year: Sometimes true  Transportation Needs: No Transportation Needs (03/03/2022)   PRAPARE - Hydrologist (Medical): No    Lack of Transportation (Non-Medical): No  Physical Activity: Inactive (03/03/2022)   Exercise Vital Sign    Days of Exercise per Week: 0 days    Minutes of Exercise per Session: 0 min  Stress: No Stress Concern Present (03/03/2022)   [place]    Feeling of Stress : Only a little  Recent Concern: Stress - Stress Concern Present (12/22/2021)   [place]    Feeling of Stress : To some extent  Social Connections: Socially Integrated (03/03/2022)   Social Connection and Isolation Panel [NHANES]    Frequency of Communication with Friends and Family: More than three times a week    Frequency of Social Gatherings with Friends and Family: More than three times a week    Attends Religious Services: More than 4 times per year    Active Member of [employer] or Organizations: Yes    Attends Archivist Meetings: More than 4 times per year    Marital Status: Married  Human resources officer Violence: Not At Risk (03/03/2022)   Humiliation, Afraid, Rape, and Kick questionnaire    Fear of Current or Ex-Partner: No  Emotionally Abused: No    Physically Abused: No     Sexually Abused: No    Review of Systems  Neurological:  Positive for focal weakness (Left arm and leg).  Psychiatric/Behavioral:  Positive for depression. The patient has insomnia.   All other systems reviewed and are negative.       Objective    BP 116/74   Pulse 80   Ht 5\' 5"  (1.651 m)   Wt 196 lb 6.4 oz (89.1 kg)   SpO2 97%   BMI 32.68 kg/m   Physical Exam Vitals reviewed.  Constitutional:      Appearance: She is ill-appearing.     Comments: Examined in wheelchair  HENT:     Head: Normocephalic and atraumatic.     Right Ear: External ear normal.     Left Ear: External ear normal.     Nose: Nose normal.     Mouth/Throat:     Mouth: Mucous membranes are moist.     Pharynx: Oropharynx is clear. No oropharyngeal exudate or posterior oropharyngeal erythema.  Eyes:     General: No scleral icterus.    Extraocular Movements: Extraocular movements intact.     Conjunctiva/sclera: Conjunctivae normal.     Pupils: Pupils are equal, round, and reactive to light.  Cardiovascular:     Rate and Rhythm: Normal rate and regular rhythm.     Pulses: Normal pulses.     Heart sounds: Murmur heard.  Pulmonary:     Effort: Pulmonary effort is normal.     Breath sounds: Normal breath sounds. No wheezing, rhonchi or rales.  Abdominal:     General: Abdomen is flat. Bowel sounds are normal.     Palpations: Abdomen is soft.  Musculoskeletal:        General: Signs of injury (Left foot wound bandage in place) present.     Cervical back: Normal range of motion.  Skin:    General: Skin is warm and dry.     Coloration: Skin is not jaundiced.  Neurological:     Mental Status: She is alert and oriented to person, place, and time.     Cranial Nerves: No cranial nerve deficit.     Motor: Weakness (Left arm and leg.  Weakness is more prominent in the left upper extremity) present.  Psychiatric:        Mood and Affect: Mood normal.        Behavior: Behavior normal.    Last CBC Lab  Results  Component Value Date   WBC 9.2 02/15/2022   HGB 9.3 (L) 02/15/2022   HCT 28.9 (L) 02/15/2022   MCV 85.8 02/15/2022   MCH 27.6 02/15/2022   RDW 15.0 02/15/2022   PLT 435 (H) 07/62/2633   Last metabolic panel Lab Results  Component Value Date   GLUCOSE 88 02/17/2022   NA 139 02/17/2022   K 3.7 02/17/2022   CL 103 02/17/2022   CO2 25 02/17/2022   BUN 38 (H) 02/17/2022   CREATININE 2.53 (H) 02/17/2022   GFRNONAA 24 (L) 02/17/2022   CALCIUM 9.0 02/17/2022   PHOS 4.4 01/29/2022   PROT 5.4 (L) 02/02/2022   ALBUMIN 2.4 (L) 02/02/2022   BILITOT 0.6 02/02/2022   ALKPHOS 89 02/02/2022   AST 24 02/02/2022   ALT 28 02/02/2022   ANIONGAP 11 02/17/2022   Last lipids Lab Results  Component Value Date   CHOL 85 01/09/2022   HDL 15 (L) 01/09/2022   LDLCALC 39 01/09/2022   LDLDIRECT 84.9  01/01/2020   TRIG 197 (H) 01/16/2022   CHOLHDL 5.7 01/09/2022   Last hemoglobin A1c Lab Results  Component Value Date   HGBA1C 8.0 (H) 01/09/2022   Last thyroid functions Lab Results  Component Value Date   TSH 41.777 (H) 01/28/2022   Last vitamin B12 and Folate Lab Results  Component Value Date   VITAMINB12 1,100 (H) 06/18/2020   FOLATE 5.6 (L) 06/18/2020    Assessment & Plan:   Problem List Items Addressed This Visit       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  She has an OmniPod.  Her most recent A1c was 8.      Primary hypertension    BP 116/74 currently.  She is prescribed metoprolol tartrate 25 mg twice daily.  No medication changes today.      CAD (coronary artery disease)    Known history of CAD.  She is followed by cardiology and has been seen since hospital discharge.  Denies chest pain currently.  No medication changes today.  Brilinta has been refilled.      Critical ischemia of upper extremity ([place])    She endorses a previous history of right upper extremity ischemia  that required stenting.  She was previously on Eliquis and Plavix.  Currently on therapeutic Lovenox and Brilinta.      Internal carotid artery occlusion, right    Right ICA occlusion noted during presentation for CVA in September.  Underwent stent placement with vascular surgery.  She has been off of Brilinta for multiple weeks after running out of medication.  This has been refilled today.      HFrEF (heart failure with reduced ejection fraction) ([place])    EF 35-40% on TTE from September.  She is currently prescribed Lasix 40 mg daily as needed as well as metoprolol tartrate 25 mg twice daily.  Euvolemic on exam today.  No medication changes.      Acute right MCA stroke [facility])    She presented to [facility] on 9/8 with dysarthria and left upper and lower extremity weakness, subsequently found to have right MCA and vertebral artery strokes.  Underwent thrombectomy and stent placement in right ICA.  She continues to have left upper and lower extremity weakness on exam today.  Weakness is more prominent in the proximal left arm.  She is currently prescribed Lipitor 40 mg daily and is on therapeutic Lovenox.  Of note, she was also discharged on Brilinta but has been out of medication for multiple weeks. -Continue Lipitor and therapeutic Lovenox -Brilinta has been refilled today -She has neurology follow-up scheduled for 12/28 -Robaxin refilled today -Currently followed by hematology and undergoing hypercoagulable work-up.  She is prescribed therapeutic Lovenox.      Hypothyroidism    Currently prescribed levothyroxine 125 mcg daily.  Her TSH level was significantly elevated in September.  She is asymptomatic currently.  Repeat TSH/T4 ordered today      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 ([place])    CKD stage IV.  She needs to establish care with nephrology.  A referral has been placed today.      Ulcer of left foot due to type 2 diabetes mellitus ([place])    Followed by podiatry and has been  seen multiple times for this issue since hospital discharge.  Her wound today is wrapped in an Ace bandage.      Insomnia  She endorses a history of insomnia for which she takes trazodone and ramelteon.  No medication changes today.      Encounter for general adult medical examination with abnormal findings    Presenting today to establish care.  Previous records and labs were reviewed. -I have ordered repeat labs today -We will follow-up in 4 weeks to review results and address outstanding preventative care items.      Return in about 4 weeks (around 05/03/2022).   Johnette Abraham, MD

## 2022-04-06 ENCOUNTER — Other Ambulatory Visit (HOSPITAL_COMMUNITY): Payer: Self-pay

## 2022-04-07 ENCOUNTER — Ambulatory Visit: Payer: No Typology Code available for payment source | Admitting: Cardiology

## 2022-04-07 ENCOUNTER — Encounter: Payer: Self-pay | Admitting: Internal Medicine

## 2022-04-07 DIAGNOSIS — Z0001 Encounter for general adult medical examination with abnormal findings: Secondary | ICD-10-CM | POA: Insufficient documentation

## 2022-04-07 DIAGNOSIS — Z5181 Encounter for therapeutic drug level monitoring: Secondary | ICD-10-CM | POA: Insufficient documentation

## 2022-04-07 DIAGNOSIS — G4700 Insomnia, unspecified: Secondary | ICD-10-CM | POA: Insufficient documentation

## 2022-04-07 NOTE — Assessment & Plan Note (Signed)
Followed by endocrinology and has been seen since hospital discharge.  She has an OmniPod.  Her most recent A1c was 8.

## 2022-04-07 NOTE — Assessment & Plan Note (Signed)
BP 116/74 currently.  She is prescribed metoprolol tartrate 25 mg twice daily.  No medication changes today.

## 2022-04-07 NOTE — Assessment & Plan Note (Signed)
She endorses a previous history of right upper extremity ischemia that required stenting.  She was previously on Eliquis and Plavix.  Currently on therapeutic Lovenox and Brilinta.

## 2022-04-07 NOTE — Assessment & Plan Note (Signed)
Presenting today to establish care.  Previous records and labs were reviewed. -I have ordered repeat labs today -We will follow-up in 4 weeks to review results and address outstanding preventative care items.

## 2022-04-07 NOTE — Assessment & Plan Note (Addendum)
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  Underwent thrombectomy and stent placement in right ICA.  She continues to have left upper and lower extremity weakness on exam today.  Weakness is more prominent in the proximal left arm.  She is currently prescribed Lipitor 40 mg daily and is on therapeutic Lovenox.  Of note, she was also discharged on Brilinta but has been out of medication for multiple weeks. -Continue Lipitor and therapeutic Lovenox -Brilinta has been refilled today -She has neurology follow-up scheduled for 12/28 -Robaxin refilled today -Currently followed by hematology and undergoing hypercoagulable work-up.  She is prescribed therapeutic Lovenox.

## 2022-04-07 NOTE — Assessment & Plan Note (Signed)
Currently prescribed levothyroxine 125 mcg daily.  Her TSH level was significantly elevated in September.  She is asymptomatic currently.  Repeat TSH/T4 ordered today

## 2022-04-07 NOTE — Assessment & Plan Note (Signed)
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  Underwent stent placement with vascular surgery.  She has been off of Brilinta for multiple weeks after running out of medication.  This has been refilled today.

## 2022-04-07 NOTE — Assessment & Plan Note (Signed)
EF 35-40% on TTE from September.  She is currently prescribed Lasix 40 mg daily as needed as well as metoprolol tartrate 25 mg twice daily.  Euvolemic on exam today.  No medication changes.

## 2022-04-07 NOTE — Assessment & Plan Note (Addendum)
Known history of CAD.  She is followed by cardiology and has been seen since hospital discharge.  Denies chest pain currently.  No medication changes today.  Brilinta has been refilled.

## 2022-04-07 NOTE — Assessment & Plan Note (Signed)
She endorses a history of insomnia for which she takes trazodone and ramelteon.  No medication changes today.

## 2022-04-07 NOTE — Assessment & Plan Note (Signed)
CKD stage IV.  She needs to establish care with nephrology.  A referral has been placed today.

## 2022-04-07 NOTE — Assessment & Plan Note (Signed)
Followed by podiatry and has been seen multiple times for this issue since hospital discharge.  Her wound today is wrapped in an Ace bandage.

## 2022-04-09 ENCOUNTER — Ambulatory Visit: Payer: No Typology Code available for payment source | Admitting: Physical Medicine and Rehabilitation

## 2022-04-10 ENCOUNTER — Other Ambulatory Visit: Payer: Self-pay | Admitting: Nurse Practitioner

## 2022-04-10 ENCOUNTER — Other Ambulatory Visit (HOSPITAL_COMMUNITY): Payer: Self-pay

## 2022-04-12 ENCOUNTER — Other Ambulatory Visit (HOSPITAL_COMMUNITY): Payer: Self-pay

## 2022-04-12 ENCOUNTER — Ambulatory Visit: Payer: Self-pay | Admitting: *Deleted

## 2022-04-12 ENCOUNTER — Encounter: Payer: Self-pay | Admitting: *Deleted

## 2022-04-12 NOTE — Patient Instructions (Signed)
Visit Information  Thank you for taking time to visit with me today. Please don't hesitate to contact me if I can be of assistance to you.   Following are the goals we discussed today:   Goals Addressed               This Visit's Progress     Assist with Applying for Medicaid, Lawyer and [place]. (pt-stated)   On track     Care Coordination Interventions:  Active listening/reflection utilized. Solution-focused strategies developed. Caregiver support provided. Caregiver stress acknowledged. Continue to receive home health nursing, physical therapy, occupational therapy and speech therapy services, through [facility]. Continue to monitor blood pressure and blood sugars, as recommended by Primary Care Provider, Dr. Lemmie Evens, and log results. Continue to await approval/denial letter from [employer], Programme researcher, broadcasting/film/video for Social Security Disability.   ~ [place], through Engineering geologist at [employer], to assist with approval process for [place], through [employer]. CSW collaboration with representative at [employer], to [place] Application is still pending with [employer], as of 04/12/2022. CSW will assist with re-applying for Adult Medicaid, through the [place], if approved for [place], through [employer].   CSW will assist with application completion and submission for [place], through [facility], if approved for Adult Medicaid, through the [place].       Our next appointment is by telephone on 04/23/2022 at 10:30 am.  Please call the care guide team at [phone]  if you need to cancel or reschedule your appointment.   If you are experiencing a Mental Health or [place] or need someone to talk to, please call the Suicide and Crisis Lifeline: 988 call the [place] National Suicide Prevention Lifeline: 548-156-3415 or TTY: 4407023100 TTY 458-204-9960) to talk to a trained counselor call 1-800-273-TALK (toll free, 24 hour hotline) go to [facility] [address] [place] [phone]) call the [place]: [phone] call 911  Patient verbalizes understanding of instructions and care plan provided today and agrees to view in [place]. Active MyChart status and patient understanding of how to access instructions and care plan via MyChart confirmed with patient.     Telephone follow up appointment with care management team member scheduled for:  04/23/2022 at 10:30 am.  Nat Christen, BSW, MSW, [place]  Licensed Clinical Social Worker  [place]  Mailing [place]. [address] [place], [place] [address] Physical Address-[address] [place], [place] [address] Toll Free Main # [phone] Fax # [phone] Cell # [phone] [email or web address]

## 2022-04-12 NOTE — Patient Outreach (Signed)
  Care Coordination   Follow Up Visit Note   04/12/2022  Name: Lauren [name] MRN: [id number] DOB: 1980-05-29  Lauren [name] is a 41 y.o. year old female who sees [name], [name], MD for primary care. I spoke with Lauren [name] and daughter, Lauren [name] by phone today.  What matters to the patients health and wellness today?   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   Goals Addressed               This Visit's Progress     Assist with Applying for Medicaid, [facility] and [place]. (pt-stated)   On track     Care Coordination Interventions:  Active listening/reflection utilized. Solution-focused strategies developed. Caregiver support provided. Caregiver stress acknowledged. Continue to receive home health nursing, physical therapy, occupational therapy and speech therapy services, through [facility]. Continue to monitor blood pressure and blood sugars, as recommended by Primary Care Provider, Dr. Lemmie Evens, and log results. Continue to await approval/denial letter from [employer], Programme researcher, broadcasting/film/video for Social Security Disability.   ~ [place], through Engineering geologist at [employer], to assist with approval process for [place], through [employer]. CSW collaboration with representative at [employer], to [place] Application is still pending with [employer], as of 04/12/2022. CSW will assist with re-applying for Adult Medicaid, through the [place], if approved for [place], through [employer].   CSW will assist with application completion and submission for [place], through [facility], if approved for Adult Medicaid, through the [place].       SDOH assessments and interventions completed:  Yes.  Care Coordination Interventions:  Yes, provided   Follow up plan: Follow up call scheduled for 04/23/2022 at 10:30 am.  Encounter Outcome:  Pt. Visit Completed.   Nat Christen, BSW, MSW, LCSW  Licensed Education officer, environmental [facility] System  Mailing [place] [address] [place], [place] [address] Physical Address-[address], [place],  [address] Toll Free Main # [phone] Fax # [phone] Cell # [phone] [email or web address]

## 2022-04-13 ENCOUNTER — Inpatient Hospital Stay: Payer: BC Managed Care – PPO | Admitting: Hematology and Oncology

## 2022-04-13 ENCOUNTER — Other Ambulatory Visit (HOSPITAL_COMMUNITY): Payer: Self-pay

## 2022-04-13 ENCOUNTER — Other Ambulatory Visit: Payer: Self-pay | Admitting: Nurse Practitioner

## 2022-04-13 MED ORDER — INSULIN ASPART 100 UNIT/ML IJ SOLN
INTRAMUSCULAR | 3 refills | Status: DC
  Filled 2022-04-13: qty 30, 50d supply, fill #0
  Filled 2022-05-17 – 2022-05-25 (×3): qty 30, 50d supply, fill #1

## 2022-04-13 MED ORDER — INSULIN LISPRO 100 UNIT/ML IJ SOLN
INTRAMUSCULAR | 0 refills | Status: DC
  Filled 2022-04-13: qty 30, 50d supply, fill #0

## 2022-04-14 ENCOUNTER — Other Ambulatory Visit (HOSPITAL_COMMUNITY): Payer: Self-pay

## 2022-04-14 ENCOUNTER — Ambulatory Visit (INDEPENDENT_AMBULATORY_CARE_PROVIDER_SITE_OTHER): Payer: BC Managed Care – PPO | Admitting: Podiatry

## 2022-04-14 DIAGNOSIS — E10621 Type 1 diabetes mellitus with foot ulcer: Secondary | ICD-10-CM

## 2022-04-14 DIAGNOSIS — L97425 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 without evidence of necrosis: Secondary | ICD-10-CM | POA: Diagnosis not present

## 2022-04-14 MED ORDER — AMOXICILLIN-POT CLAVULANATE 875-125 MG PO TABS: 1.0000 | ORAL_TABLET | Freq: Two times a day (BID) | ORAL | 0 refills | Status: DC

## 2022-04-15 ENCOUNTER — Inpatient Hospital Stay: Payer: BC Managed Care – PPO

## 2022-04-15 DIAGNOSIS — I2510 Atherosclerotic heart disease of native coronary artery without angina pectoris: Secondary | ICD-10-CM | POA: Insufficient documentation

## 2022-04-15 DIAGNOSIS — Z9049 Acquired absence of other specified parts of digestive tract: Secondary | ICD-10-CM | POA: Diagnosis not present

## 2022-04-15 DIAGNOSIS — Z8673 Personal history of transient ischemic attack (TIA), and cerebral infarction without residual deficits: Secondary | ICD-10-CM | POA: Insufficient documentation

## 2022-04-15 DIAGNOSIS — I509 Heart failure, unspecified: Secondary | ICD-10-CM | POA: Insufficient documentation

## 2022-04-15 DIAGNOSIS — Z801 Family history of malignant neoplasm of trachea, bronchus and lung: Secondary | ICD-10-CM | POA: Diagnosis not present

## 2022-04-15 DIAGNOSIS — E1122 Type 2 diabetes mellitus with diabetic chronic kidney disease: Secondary | ICD-10-CM | POA: Diagnosis not present

## 2022-04-15 DIAGNOSIS — Z7901 Long term (current) use of anticoagulants: Secondary | ICD-10-CM | POA: Diagnosis not present

## 2022-04-15 DIAGNOSIS — Z87891 Personal history of nicotine dependence: Secondary | ICD-10-CM | POA: Insufficient documentation

## 2022-04-15 DIAGNOSIS — Z803 Family history of malignant neoplasm of breast: Secondary | ICD-10-CM | POA: Insufficient documentation

## 2022-04-15 DIAGNOSIS — I639 Cerebral infarction, unspecified: Secondary | ICD-10-CM | POA: Diagnosis present

## 2022-04-15 DIAGNOSIS — I252 Old myocardial infarction: Secondary | ICD-10-CM | POA: Diagnosis not present

## 2022-04-15 LAB — COMPREHENSIVE METABOLIC PANEL
ALT: 27 U/L (ref 0–44)
AST: 21 U/L (ref 15–41)
Albumin: 3 g/dL — ABNORMAL LOW (ref 3.5–5.0)
Alkaline Phosphatase: 92 U/L (ref 38–126)
Anion gap: 7 (ref 5–15)
BUN: 27 mg/dL — ABNORMAL HIGH (ref 6–20)
CO2: 24 mmol/L (ref 22–32)
Calcium: 8.3 mg/dL — ABNORMAL LOW (ref 8.9–10.3)
Chloride: 103 mmol/L (ref 98–111)
Creatinine, Ser: 2.25 mg/dL — ABNORMAL HIGH (ref 0.44–1.00)
GFR, Estimated: 28 mL/min — ABNORMAL LOW (ref >60)
Glucose, Bld: 181 mg/dL — ABNORMAL HIGH (ref 70–99)
Potassium: 4.2 mmol/L (ref 3.5–5.1)
Sodium: 134 mmol/L — ABNORMAL LOW (ref 135–145)
Total Bilirubin: 0.4 mg/dL (ref 0.3–1.2)
Total Protein: 7.6 g/dL (ref 6.5–8.1)

## 2022-04-15 LAB — CBC WITH DIFFERENTIAL/PLATELET
Abs Immature Granulocytes: 0.03 10*3/uL (ref 0.00–0.07)
Basophils Absolute: 0 10*3/uL (ref 0.0–0.1)
Basophils Relative: 0 %
Eosinophils Absolute: 0.2 10*3/uL (ref 0.0–0.5)
Eosinophils Relative: 2 %
HCT: 38.6 % (ref 36.0–46.0)
Hemoglobin: 12.1 g/dL (ref 12.0–15.0)
Immature Granulocytes: 0 %
Lymphocytes Relative: 19 %
Lymphs Abs: 1.7 10*3/uL (ref 0.7–4.0)
MCH: 27.9 pg (ref 26.0–34.0)
MCHC: 31.3 g/dL (ref 30.0–36.0)
MCV: 88.9 fL (ref 80.0–100.0)
Monocytes Absolute: 0.7 10*3/uL (ref 0.1–1.0)
Monocytes Relative: 8 %
Neutro Abs: 6.5 10*3/uL (ref 1.7–7.7)
Neutrophils Relative %: 71 %
Platelets: 406 10*3/uL — ABNORMAL HIGH (ref 150–400)
RBC: 4.34 MIL/uL (ref 3.87–5.11)
RDW: 15.5 % (ref 11.5–15.5)
WBC: 9.1 10*3/uL (ref 4.0–10.5)
nRBC: 0 % (ref 0.0–0.2)

## 2022-04-15 NOTE — Progress Notes (Signed)
  Subjective:  Patient ID: Lauren [name], female    DOB: [birth year],  MRN: [id number]  Chief Complaint  Patient presents with   Diabetic Ulcer    3 week follow up left foot    41 y.o. female presents with the above complaint. History confirmed with patient.  She is doing well they have been changing with Iodosorb daily  Objective:  Physical Exam: warm, good capillary refill and weakly palpable pulses, minimal sensation, full thickness wound measuring 0.5 x 0.2 x 0.6 cm with exposed subcutaneous tissue no s/o infection.  She has new ulceration measuring 0.6 x 0.6 x 0.3 cm on the fifth metatarsal base with surrounding erythema no drainage no exposed bone tendon or joint         Assessment:   1. 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   Ulcer left foot -We discussed the etiology and factors that are a part of the wound healing process.  We also discussed the risk of infection both soft tissue and osteomyelitis from open ulceration.  Discussed the risk of limb loss if this happens or worsens. -Debridement as below. -Dressed with iodosorb, DSD. -Continue home dressing changes daily with 4 x 4 gauze and iodosorb -Continue off-loading with surgical shoe. -Wound at the amputation site continues to improve.  Unfortunately the fifth metatarsal base wound is progressing.  I recommend we place her on antibiotics and Augmentin was sent to pharmacy.  They will apply Iodosorb to this as well.  I will see her back in 2 weeks for follow-up  Procedure: Excisional Debridement of Wound Rationale: Removal of non-viable soft tissue from the wound to promote healing.  Anesthesia: none Post-Debridement Wound Measurements: Noted above Type of Debridement: Sharp Excisional Tissue Removed: Non-viable soft tissue Depth of Debridement:  subcutaneous tissue. Technique: Sharp excisional debridement to bleeding, viable wound base.  Dressing: Dry, sterile, compression dressing. Disposition: Patient tolerated procedure well.          Return in about 2 weeks (around 04/28/2022) for wound care.

## 2022-04-16 ENCOUNTER — Other Ambulatory Visit (HOSPITAL_BASED_OUTPATIENT_CLINIC_OR_DEPARTMENT_OTHER): Payer: Self-pay

## 2022-04-16 ENCOUNTER — Other Ambulatory Visit (HOSPITAL_COMMUNITY): Payer: Self-pay

## 2022-04-17 ENCOUNTER — Other Ambulatory Visit (HOSPITAL_COMMUNITY): Payer: Self-pay

## 2022-04-18 LAB — PTT-LA MIX: PTT-LA Mix: 42.4 s — ABNORMAL HIGH (ref 0.0–40.5)

## 2022-04-18 LAB — DRVVT MIX: dRVVT Mix: 42.6 s — ABNORMAL HIGH (ref 0.0–40.4)

## 2022-04-18 LAB — LUPUS ANTICOAGULANT PANEL
DRVVT: 47.3 s — ABNORMAL HIGH (ref 0.0–47.0)
PTT Lupus Anticoagulant: 50 s — ABNORMAL HIGH (ref 0.0–43.5)

## 2022-04-18 LAB — DRVVT CONFIRM: dRVVT Confirm: 1.1 ratio (ref 0.8–1.2)

## 2022-04-18 LAB — HEXAGONAL PHASE PHOSPHOLIPID: Hexagonal Phase Phospholipid: 5 s (ref 0–11)

## 2022-04-19 ENCOUNTER — Other Ambulatory Visit: Payer: Self-pay | Admitting: Internal Medicine

## 2022-04-19 ENCOUNTER — Telehealth: Payer: Self-pay | Admitting: *Deleted

## 2022-04-19 ENCOUNTER — Other Ambulatory Visit (HOSPITAL_COMMUNITY): Payer: Self-pay

## 2022-04-19 ENCOUNTER — Encounter: Payer: BC Managed Care – PPO | Attending: Registered Nurse | Admitting: Physical Medicine & Rehabilitation

## 2022-04-19 ENCOUNTER — Inpatient Hospital Stay (HOSPITAL_BASED_OUTPATIENT_CLINIC_OR_DEPARTMENT_OTHER): Payer: BC Managed Care – PPO | Admitting: Hematology and Oncology

## 2022-04-19 ENCOUNTER — Other Ambulatory Visit: Payer: Self-pay

## 2022-04-19 ENCOUNTER — Telehealth: Payer: Self-pay | Admitting: Pharmacy Technician

## 2022-04-19 ENCOUNTER — Encounter: Payer: Self-pay | Admitting: Hematology and Oncology

## 2022-04-19 ENCOUNTER — Other Ambulatory Visit (HOSPITAL_BASED_OUTPATIENT_CLINIC_OR_DEPARTMENT_OTHER): Payer: Self-pay

## 2022-04-19 ENCOUNTER — Encounter: Payer: Self-pay | Admitting: Physical Medicine & Rehabilitation

## 2022-04-19 VITALS — BP 139/87 | HR 85 | Ht 65.0 in | Wt 202.0 lb

## 2022-04-19 DIAGNOSIS — I10 Essential (primary) hypertension: Secondary | ICD-10-CM | POA: Diagnosis present

## 2022-04-19 DIAGNOSIS — I63511 Cerebral infarction due to unspecified occlusion or stenosis of right middle cerebral artery: Secondary | ICD-10-CM | POA: Insufficient documentation

## 2022-04-19 DIAGNOSIS — R252 Cramp and spasm: Secondary | ICD-10-CM | POA: Diagnosis not present

## 2022-04-19 DIAGNOSIS — I639 Cerebral infarction, unspecified: Secondary | ICD-10-CM

## 2022-04-19 DIAGNOSIS — S43002S Unspecified subluxation of left shoulder joint, sequela: Secondary | ICD-10-CM | POA: Diagnosis not present

## 2022-04-19 MED ORDER — TIZANIDINE HCL 4 MG PO TABS
4.0000 mg | ORAL_TABLET | Freq: Every day | ORAL | 0 refills | Status: DC
  Filled 2022-04-19: qty 30, 30d supply, fill #0

## 2022-04-19 MED ORDER — PANTOPRAZOLE SODIUM 40 MG PO TBEC
40.0000 mg | DELAYED_RELEASE_TABLET | Freq: Every day | ORAL | 0 refills | Status: DC
  Filled 2022-04-19: qty 30, 30d supply, fill #0

## 2022-04-19 MED ORDER — RANOLAZINE ER 500 MG PO TB12
500.0000 mg | ORAL_TABLET | Freq: Two times a day (BID) | ORAL | 0 refills | Status: DC
  Filled 2022-04-19: qty 60, 30d supply, fill #0

## 2022-04-19 MED ORDER — LEVOTHYROXINE SODIUM 125 MCG PO TABS
125.0000 ug | ORAL_TABLET | Freq: Every day | ORAL | 0 refills | Status: DC
  Filled 2022-04-19: qty 30, 30d supply, fill #0

## 2022-04-19 MED ORDER — ATORVASTATIN CALCIUM 40 MG PO TABS
40.0000 mg | ORAL_TABLET | Freq: Every day | ORAL | 0 refills | Status: DC
  Filled 2022-04-19: qty 30, 30d supply, fill #0

## 2022-04-19 MED ORDER — ISOSORBIDE MONONITRATE 20 MG PO TABS
20.0000 mg | ORAL_TABLET | Freq: Two times a day (BID) | ORAL | 0 refills | Status: DC
  Filled 2022-04-19 (×2): qty 60, 30d supply, fill #0

## 2022-04-19 MED ORDER — DICLOFENAC SODIUM 1 % EX GEL: 2.0000 g | Freq: Four times a day (QID) | CUTANEOUS | 5 refills | Status: DC

## 2022-04-19 NOTE — Telephone Encounter (Signed)
Patient's husband left a message that all the people that they had talked to about her Insulin/Omnipod , has told them that they are waiting for a PA from our office.. She has on her last Omnipod and it will be completed at 9 PM tonight. He is asking that we do something to speed up the process.  Has any of you ladies seen anything?

## 2022-04-19 NOTE — Telephone Encounter (Signed)
Pharmacy Patient Advocate Encounter   Received notification from pt's husband/office staff (RX request msg) that prior authorization for Omnipod 5 G6 is required/requested.   PA submitted on 04/19/22 to (ins) [employer] commercial via [employer] [id number] Status is pending

## 2022-04-19 NOTE — Telephone Encounter (Signed)
Patient's husband left a message that the people they have talked to about his wife's medication have said , that a PA is needed and they have been waiting on our office for the information.  Patient has on her last Omnipod and that will be finished tonight at 9 PM. Patient's husband is asking that something be done to speed up this process and a call back is needed.

## 2022-04-19 NOTE — Telephone Encounter (Signed)
Request received today. PA approved. Routed PA encounter to you.

## 2022-04-19 NOTE — Telephone Encounter (Signed)
We can refill her Lipitor and Synthroid.  The others would need to be done by PCP.  I havent seen anything about a PA for her Omnipod

## 2022-04-19 NOTE — Progress Notes (Signed)
[facility] [place] NOTE  Patient Care Team: Johnette Abraham, MD as PCP - General (Internal Medicine) Harl Bowie, Alphonse Guild, MD as PCP - Cardiology (Cardiology) Phillips Odor, MD as Consulting Physician (Neurology) [place], Maree Erie, LCSW as Social Worker (Licensed Clinical Social Worker)  CHIEF COMPLAINTS/PURPOSE OF CONSULTATION:  Stroke  ASSESSMENT & PLAN:   This is a very pleasant 41 year old female with multiple medical comorbidities including uncontrolled diabetes, MI, CKD, coronary artery disease, history of stroke, arterial thrombosis recently admitted with stroke while on Eliquis hence hematology was consulted.  When she was in the hospital we did some hypercoagulable work-up which did not show definitive evidence of hypercoagulable disorder except for abnormal lupus anticoagulant which could be from concomitant anticoagulation. No evidence of factor V Leiden, prothrombin gene, Antithrombin III, beta-2 glycoprotein, cardiolipin antibodies.  Protein C&S activity were normal. She is currently on Lovenox for anticoagulation, tolerating it very well. We discussed about further work-up including JAK2, PNH panel and repeating lupus anticoagulant today.  Lupus anticoagulant neg. PNH pending. I am not entirely sure if they drew JAK panel or not, there is a miscellaneous lab pending. When she gets tired of injections however she can be transitioned to warfarin with an INR target of 2-3. We have once again talked about options for anticoagulation including Lovenox versus warfarin.  She is interested in trying warfarin since she is running a response to inject herself.  We will try to contact PCPs office if they can help [name] transition Lovenox to warfarin.  In the interim when her labs result, we will let her know as they become available. She will return to clinic in person in 1 year unless needed sooner. HISTORY OF PRESENTING ILLNESS:   Lauren [name] 41 y.o. female is here  because of stroke at young age  This is a very pleasant 41 year old female patient with uncontrolled diabetes, myocardial infarction, CHF, CKD, coronary artery disease history of stroke who was recently admitted with another stroke back in September while on Eliquis.  She also was diagnosed with osteomyelitis and was on antibiotics at that time.  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  There was no evidence of cardioembolic etiology.  She was on heparin at the time of admission.  Given new stroke while on Eliquis hematology was consulted for anticoagulation recommendations.  She is here for telephone visit.  She is here to review labs.  She also tells me that she is running out response to give herself injections.  She did not have any other concerns to discuss.    MEDICAL HISTORY:  Past Medical History:  Diagnosis Date   Anemia    CAD (coronary artery disease)    a. s/p cath in 03/2014 showing 30% mid-LAD, moderate to severe disease along small D1, patent LCx, moderate to severe distal OM2 stenosis and moderate diffuse diease along RCA not amenable to PCI   CHF (congestive heart failure) ([place])    a. EF 55-60% in 12/2019 b. EF at 35-40% by echo in 05/2020   CKD (chronic kidney disease) stage 4, GFR 15-29 ml/min (HCC)    Diabetes mellitus without complication ([place])    Myocardial infarction ([place])    Neuropathy    Stroke [facility])     SURGICAL HISTORY: Past Surgical History:  Procedure Laterality Date   AMPUTATION Left 09/02/2021   Procedure: AMPUTATION RAY;  Surgeon: Sherryle Lis,  Stephan Minister, DPM;  Location: [place];  Service: Podiatry;  Laterality: Left;  sagittal saw, 3L bag saline & Pulse   Cardiac catherization     CHOLECYSTECTOMY     I & D EXTREMITY Left 08/30/2021   Procedure: IRRIGATION AND DEBRIDEMENT WITH BONE BIOPSY;  Surgeon: [name], DPM;  Location: [place];  Service: Podiatry;   Laterality: Left;   IR ANGIO VERTEBRAL SEL SUBCLAVIAN INNOMINATE UNI L MOD SED  01/18/2022   IR CT HEAD LTD  01/08/2022   IR INTRA CRAN STENT  01/08/2022   IR PERCUTANEOUS ART THROMBECTOMY/INFUSION INTRACRANIAL INC DIAG ANGIO  01/08/2022   IRRIGATION AND DEBRIDEMENT FOOT Left 09/04/2021   Procedure: IRRIGATION AND DEBRIDEMENT FOOT AND CLOSURE;  Surgeon: Criselda Peaches, DPM;  Location: [place];  Service: Podiatry;  Laterality: Left;   RADIOLOGY WITH ANESTHESIA N/A 01/08/2022   Procedure: IR WITH ANESTHESIA;  Surgeon: Radiologist, Medication, MD;  Location: [place];  Service: Radiology;  Laterality: N/A;   THROMBECTOMY BRACHIAL ARTERY Right 11/18/2021   Procedure: RIGHT BRACHIAL, RADIAL, & ULNAR ARTERY THROMBECTOMY.;  Surgeon: Marty Heck, MD;  Location: [place];  Service: Vascular;  Laterality: Right;   TUBAL LIGATION      SOCIAL HISTORY: Social History   Socioeconomic History   Marital status: Married    Spouse name: Jerelyn Scott   Number of children: 2   Years of education: 12   Highest education level: 12th grade  Occupational History    Comment: Full time   Occupation: CNA at [employer]   Smoking status: Former    Packs/day: 0.25    Types: Cigarettes    Quit date: 10/27/2020    Years since quitting: 1.4    Passive exposure: Past   Smokeless tobacco: Never  Vaping Use   Vaping Use: Never used  Substance and Sexual Activity   Alcohol use: No    Alcohol/week: 0.0 standard drinks of alcohol   Drug use: No   Sexual activity: Yes    Birth control/protection: Surgical    Comment: tubal   Other Topics Concern   Not on file  Social History Narrative   Lives with husband and kids   Right handed   Drinks 9+ cups caffeine daily   Social Determinants of Health   Financial Resource Strain: Low Risk  (03/03/2022)   Overall Financial Resource Strain (CARDIA)    Difficulty of Paying Living Expenses: Not very hard  Recent Concern: Emergency planning/management officer Strain - High Risk  (01/14/2022)   Overall Financial Resource Strain (CARDIA)    Difficulty of Paying Living Expenses: Hard  Food Insecurity: No Food Insecurity (03/03/2022)   Hunger Vital Sign    Worried About Running Out of Food in the Last Year: Never true    Ran Out of Food in the Last Year: Never true  Recent Concern: Food Insecurity - Food Insecurity Present (01/16/2022)   Hunger Vital Sign    Worried About Running Out of Food in the Last Year: Sometimes true    Ran Out of Food in the Last Year: Sometimes true  Transportation Needs: No Transportation Needs (03/03/2022)   PRAPARE - Hydrologist (Medical): No    Lack of Transportation (Non-Medical): No  Physical Activity: Inactive (03/03/2022)   Exercise Vital Sign    Days of Exercise per Week: 0 days    Minutes of Exercise per Session: 0 min  Stress: No Stress Concern Present (03/03/2022)   [employer]  of Occupational Health - Occupational Stress Questionnaire    Feeling of Stress : Only a little  Recent Concern: Stress - Stress Concern Present (12/22/2021)   [place]    Feeling of Stress : To some extent  Social Connections: Socially Integrated (03/03/2022)   Social Connection and Isolation Panel [NHANES]    Frequency of Communication with Friends and Family: More than three times a week    Frequency of Social Gatherings with Friends and Family: More than three times a week    Attends Religious Services: More than 4 times per year    Active Member of [employer] or Organizations: Yes    Attends Music therapist: More than 4 times per year    Marital Status: Married  Human resources officer Violence: Not At Risk (03/03/2022)   Humiliation, Afraid, Rape, and Kick questionnaire    Fear of Current or Ex-Partner: No    Emotionally Abused: No    Physically Abused: No    Sexually Abused: No    FAMILY HISTORY: Family History  Problem Relation Age of  Onset   Thyroid disease Mother    Hypertension Mother    Hyperlipidemia Mother    CAD Mother    CVA Mother    Hyperlipidemia Father    Hypertension Father    CAD Father    Breast cancer Paternal Grandmother 46   Cancer Paternal Grandmother        lung    ALLERGIES:  is allergic to wellbutrin [bupropion], cefepime, ciprofloxacin hcl, and tape.  MEDICATIONS:  Current Outpatient Medications  Medication Sig Dispense Refill   acetaminophen (TYLENOL) 325 MG tablet Take 1-2 tablets (325-650 mg total) by mouth every 4 (four) hours as needed for mild pain. 100 tablet 0   amoxicillin-clavulanate (AUGMENTIN) 875-125 MG tablet Take 1 tablet by mouth 2 (two) times daily. 28 tablet 0   atorvastatin (LIPITOR) 40 MG tablet Take 1 tablet (40 mg total) by mouth daily. 30 tablet 0   Continuous Blood Gluc Sensor (DEXCOM G6 SENSOR) MISC Change sensor every 10 days as directed 9 each 3   enoxaparin (LOVENOX) 120 MG/0.8ML injection Inject 0.8 mLs (120 mg total) into the skin daily. 24 mL 6   furosemide (LASIX) 40 MG tablet Take 40 mg daily as needed for swelling 90 tablet 3   insulin aspart (NOVOLOG) 100 UNIT/ML injection Use with Omnipod for daily dose around 60 units daily 40 mL 3   Insulin Disposable Pump (OMNIPOD 5 G6 POD, GEN 5,) MISC Change pod every 48-72 hours 6 each 3   isosorbide mononitrate (ISMO) 20 MG tablet Take 1 tablet (20 mg total) by mouth 2 (two) times daily. 60 tablet 0   levothyroxine (SYNTHROID) 125 MCG tablet Take 1 tablet (125 mcg total) by mouth daily at 6 (six) AM. 30 tablet 0   magnesium oxide (MAG-OX) 400 MG tablet Take 0.5 tablets (200 mg total) by mouth at bedtime. 15 tablet 0   methocarbamol (ROBAXIN) 500 MG tablet Take 1 tablet (500 mg total) by mouth every 6 (six) hours as needed for muscle spasms. 60 tablet 0   metoprolol tartrate (LOPRESSOR) 25 MG tablet Take 1.5 tablets (37.5 mg total) by mouth 2 (two) times daily. 270 tablet 2   Multiple Vitamin (MULTIVITAMIN WITH  MINERALS) TABS tablet Take 1 tablet by mouth daily.     nicotine (NICODERM CQ - DOSED IN MG/24 HOURS) 21 mg/24hr patch Place 1 patch (21 mg total) onto the  skin daily. 28 patch 0   nitroGLYCERIN (NITROSTAT) 0.4 MG SL tablet Place 1 tablet (0.4 mg total) under the tongue every 5 (five) minutes x 3 doses as needed for chest pain. 25 tablet 3   ondansetron (ZOFRAN) 4 MG tablet Take 1 tablet (4 mg total) by mouth every 8 (eight) hours as needed for nausea or vomiting. 90 tablet 0   pantoprazole (PROTONIX) 40 MG tablet Take 1 tablet (40 mg total) by mouth daily. 30 tablet 0   polyethylene glycol (MIRALAX / GLYCOLAX) 17 g packet Take 17 g by mouth 2 (two) times daily. 60 each 0   ramelteon (ROZEREM) 8 MG tablet Take 1 tablet (8 mg total) by mouth at bedtime. 30 tablet 0   ranolazine (RANEXA) 500 MG 12 hr tablet Take 1 tablet (500 mg total) by mouth 2 (two) times daily. 60 tablet 0   ticagrelor (BRILINTA) 90 MG TABS tablet Take 1 tablet (90 mg total) by mouth 2 (two) times daily. 60 tablet 2   tiZANidine (ZANAFLEX) 4 MG tablet Take 1 tablet (4 mg total) by mouth at bedtime. 30 tablet 0   traZODone (DESYREL) 50 MG tablet Take 1 tablet (50 mg total) by mouth at bedtime. 30 tablet 0   No current facility-administered medications for this visit.     PHYSICAL EXAMINATION: ECOG PERFORMANCE STATUS: 0 - Asymptomatic  There were no vitals filed for this visit.  There were no vitals filed for this visit.  Physical examination not done, telephone visit  LABORATORY DATA:  I have reviewed the data as listed Lab Results  Component Value Date   WBC 9.1 04/15/2022   HGB 12.1 04/15/2022   HCT 38.6 04/15/2022   MCV 88.9 04/15/2022   PLT 406 (H) 04/15/2022     Chemistry      Component Value Date/Time   NA 134 (L) 04/15/2022 1605   NA 135 11/17/2015 1153   K 4.2 04/15/2022 1605   CL 103 04/15/2022 1605   CO2 24 04/15/2022 1605   BUN 27 (H) 04/15/2022 1605   BUN 29 (H) 11/17/2015 1153   CREATININE  2.25 (H) 04/15/2022 1605      Component Value Date/Time   CALCIUM 8.3 (L) 04/15/2022 1605   ALKPHOS 92 04/15/2022 1605   AST 21 04/15/2022 1605   ALT 27 04/15/2022 1605   BILITOT 0.4 04/15/2022 1605       RADIOGRAPHIC STUDIES: I have personally reviewed the radiological images as listed and agreed with the findings in the report. No results found.  All questions were answered. The patient knows to call the clinic with any problems, questions or concerns.   I connected with  [name] on 04/19/22 by a telephone application and verified that I am speaking with the correct person using two identifiers.   I discussed the limitations of evaluation and management by telemedicine. The patient expressed understanding and agreed to proceed.  I spent 8 minutes in the care of this patient including H and P, review of records, counseling and coordination of care.     Benay Pike, MD 04/19/2022 8:35 AM

## 2022-04-19 NOTE — Telephone Encounter (Signed)
Called [facility] and requested that they send over the request with the pts prescription drug insurance information.

## 2022-04-19 NOTE — Progress Notes (Signed)
Subjective:    Patient ID: Lauren [name], female    DOB: October 09, 1980, 41 y.o.   MRN: [id number]  [facility] Summary 10/19   Brief HPI:   Lauren [name] is a 41 y.o. female noted to the emergency room on 05/10/2021 with unsteady gait, left-sided facial droop and left arm weakness with slurred speech.  Code stroke called.  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  She underwent thrombectomy with TICI 3.  Cardiology consulted and recent echo revealed decrease in EF to approximately 30 to 35%.  She was noted to have elevated troponin with unremarkable EKG and was treated with nitroglycerin and heparin infusion.  ID was consulted for input due to history of left foot osteomyelitis as well as podiatry.  She underwent bone biopsy of fourth metatarsal left foot and broad-spectrum antibiotics were adjusted.  She did receive 1 unit PRBCs due to drop in H&H.  Hypercoagulable work-up was negative and she was started on Lovenox per hematology input.  Antibiotics were discontinued as cultures were negative.  Diet was advanced to dysphagia 3, leukocytosis was resolving with improvement in renal status.  PT/OT /ST was working with patient who continued to be limited by weakness as well as cognitive deficits.  CIR was recommended due to functional decline.     Hospital Course: Lauren [name] was admitted to rehab 01/28/2022 for inpatient therapies to consist of PT, ST and OT at least three hours five days a week. Past admission physiatrist, therapy team and rehab RN have worked together to provide customized collaborative inpatient rehab. Diet advanced to regular textures. Seen by podiatry 9/30 and left films obtained. Some dehiscence of incision. New wound care orders placed.  [name] saw the patient on 10/3 and allowed the patient to wear post-op shoe or CAM boot to WBAT.    Rapid response called at 1147 on 10/4 due to chest pain. NTG given and  EKG times 2 performed. No evidence of STE. Chest xray c/w volume overload and mild pulmonary edema. Cardiology resident consulted. Lasix 40 mg PO given. Creatinine bump to 2.26. Symptoms improved. BP overnight 110s-120s.  Lasix 10 mg PO daily started on 10/5. Also, hemoglobin drift to 7.0 although on recheck was 7.8. Transfused one unit PRBCs. Check FOBT and restart home Protonix. Creatinine slightly lower at 2.12. Fecal occult stool testing negative times 3 samples and follow up CBC showed H/H to be stable on DAPT as well as Lovenox for coagulopathy.  Dose has been  adjusted due to CKD.  She has had issues with left shoulder pain and sling was ordered for use as needed.  Tramadol has also been used on as needed basis for pain management.   Sudden onset of chest pain again on 10/9. NTG times 3 doses given without relief. Rapid response called. Stat EKG, troponin and CK ordered. Chest x-ray ok and trop below her baseline. Trazodone increased to 75 mg at bedtime for sleep. Increased to 100 mg on 10/12. Wanted to try something else for sleep so started on tizanidine 4 mg q HS.  Hemoglobin trending upward and 9.3 on 10/16. Lasix decreased to 20 mg daily on 10/16 due to elevation in serum creatinine. Consulted medicine service on 10/18 for assistance with kidney function, fluid balance and glucose control.  Strict I's and O's were recommended as patient did not appear to to be fluid overloaded.     Blood pressures were monitored on TID basis  and she was hypotensive on 10/2 without complaints. Midday hydralazine held. Cardiology consulted due to low EF and multiple meds. At admission, she is receiving hydralazine 12.5 mg TID, isosorbide 20 mg BID, Lopressor 37.5 mg BID but was complicated by multiple episodes of orthostatic hypotension with presyncope and cards was consulted multiple times to help with adjustment of medications. Compression with ACE wrap LE and TED hose RLE when OOB also used for BP support. [name]  felt that there was nothing further to add for patient's symptoms and recommended home with hospice. As BP stabilized, Lasix 40 mg was started for vascular congestion seen on CXR 10/5. Lopressor increase back to 37.5 mg BID.   Diabetes has been monitored with ac/hs CBG checks and SSI was use prn for tighter BS control. Levemir 25 units continued and Novolog 2 units TID with meals. Diabetic coordinator consulted for assistance with diet. Increased meal coverage to 5 units and added nighttime SSI.  Multiple changes in insulin management were made throughout her stay.  Her blood sugars continue to be poorly controlled as patient was not willing to comply with dietary restrictions during her stay.  Internal medicine recommended that patient's insulin pump be resumed as patient reported that this worked better for her.   Diabetic coordinator was consulted who reviewed endocrinology notes and pump was resumed per home regimen.  Both patient and family have been instructed on importance of diabetic management as well as dietary restrictions to provide fluid overload as well as better blood sugar control.  They are by her of need to contact endocrinology for further adjustment in her insulin protocol.  Patient has made progress during his stay but continues to be limited by left inattention, decrease in attention, problems with recall as well as overall safety awareness.  She will continue to receive further follow-up home health PT, OT, OT and RN by [facility] home health after discharge.   .   Rehab course: During patient's stay in rehab weekly team conferences were held to monitor patient's progress, set goals and discuss barriers to discharge. At admission, patient required X assist with basic self-care skills, moderate to total assist for mobility.   She has had improvement in activity tolerance, balance, postural control as well as ability to compensate for deficits. She has had improvement in functional use LUE   and LLE as well as improvement in awareness.  She is able to complete ADL tasks with min assist.  She requires supervision for transfers and min assist to ambulate 30'with RW and left hand splint.  She requires min to mod assist with verbal and visual cues to complete functional and familiar tasks safely.  She is able to utilize compensatory swallow strategies at modified independent level.  Patient's safety is also intermittently impacted by mobility.  Family has been instructed on 24 hours supervision for safety. Family education has been completed regarding all aspects of care.     Interval history Lauren [name] reports that she has been doing well overall since her discharge from CIR.  She has completed her home therapy.  She is walking much better  She sometimes needs a little assistance with dressing however is able to do it mostly on her own.  She reports she was seen by her PCP for follow-up.  She is also following with hematology.  She feels like her strength has improved significantly since her discharge.  She continues to have shoulder pain and uses tramadol ordered by her PCP about once a  day.  She reports her left leg is healing well and she is following with her surgeon for this.  She denies any issues with bowel or bladder control.  Pain Inventory Average Pain 6 Pain Right Now 6 My pain is aching  LOCATION OF PAIN  Shoulder,Wrist, Hand, fingers  BOWEL Number of stools per week: 2-3   BLADDER Normal   Mobility walk with assistance use a walker how many minutes can you walk? 2-3 ability to climb steps?  yes do you drive?  no use a wheelchair Do you have any goals in this area?  yes  Function not employed: date last employed .  Neuro/Psych No problems in this area  Prior Studies Any changes since last visit?  no  Physicians involved in your care Any changes since last visit?  no   Family History  Problem Relation Age of Onset   Thyroid disease Mother     Hypertension Mother    Hyperlipidemia Mother    CAD Mother    CVA Mother    Hyperlipidemia Father    Hypertension Father    CAD Father    Breast cancer Paternal Grandmother 33   Cancer Paternal Grandmother        lung   Social History   Socioeconomic History   Marital status: Married    Spouse name: Jerelyn Scott   Number of children: 2   Years of education: 12   Highest education level: 12th grade  Occupational History    Comment: Full time   Occupation: CNA at [employer] Use   Smoking status: Former    Packs/day: 0.25    Types: Cigarettes    Quit date: 10/27/2020    Years since quitting: 1.4    Passive exposure: Past   Smokeless tobacco: Never  Vaping Use   Vaping Use: Never used  Substance and Sexual Activity   Alcohol use: No    Alcohol/week: 0.0 standard drinks of alcohol   Drug use: No   Sexual activity: Yes    Birth control/protection: Surgical    Comment: tubal   Other Topics Concern   Not on file  Social History Narrative   Lives with husband and kids   Right handed   Drinks 9+ cups caffeine daily   Social Determinants of Health   Financial Resource Strain: Low Risk  (03/03/2022)   Overall Financial Resource Strain (CARDIA)    Difficulty of Paying Living Expenses: Not very hard  Recent Concern: Emergency planning/management officer Strain - High Risk (01/14/2022)   Overall Financial Resource Strain (CARDIA)    Difficulty of Paying Living Expenses: Hard  Food Insecurity: No Food Insecurity (03/03/2022)   Hunger Vital Sign    Worried About Running Out of Food in the Last Year: Never true    Ran Out of Food in the Last Year: Never true  Recent Concern: Food Insecurity - Food Insecurity Present (01/16/2022)   Hunger Vital Sign    Worried About Running Out of Food in the Last Year: Sometimes true    Ran Out of Food in the Last Year: Sometimes true  Transportation Needs: No Transportation Needs (03/03/2022)   PRAPARE - Hydrologist (Medical): No     Lack of Transportation (Non-Medical): No  Physical Activity: Inactive (03/03/2022)   Exercise Vital Sign    Days of Exercise per Week: 0 days    Minutes of Exercise per Session: 0 min  Stress: No Stress Concern Present (03/03/2022)  [place] Questionnaire    Feeling of Stress : Only a little  Recent Concern: Stress - Stress Concern Present (12/22/2021)   [place]    Feeling of Stress : To some extent  Social Connections: Socially Integrated (03/03/2022)   Social Connection and Isolation Panel [NHANES]    Frequency of Communication with Friends and Family: More than three times a week    Frequency of Social Gatherings with Friends and Family: More than three times a week    Attends Religious Services: More than 4 times per year    Active Member of Clubs or Organizations: Yes    Attends Archivist Meetings: More than 4 times per year    Marital Status: Married   Past Surgical History:  Procedure Laterality Date   AMPUTATION Left 09/02/2021   Procedure: AMPUTATION RAY;  Surgeon: Criselda Peaches, DPM;  Location: [place];  Service: Podiatry;  Laterality: Left;  sagittal saw, 3L bag saline & Pulse   Cardiac catherization     CHOLECYSTECTOMY     I & D EXTREMITY Left 08/30/2021   Procedure: IRRIGATION AND DEBRIDEMENT WITH BONE BIOPSY;  Surgeon: [name], DPM;  Location: [place];  Service: Podiatry;  Laterality: Left;   IR ANGIO VERTEBRAL SEL SUBCLAVIAN INNOMINATE UNI L MOD SED  01/18/2022   IR CT HEAD LTD  01/08/2022   IR INTRA CRAN STENT  01/08/2022   IR PERCUTANEOUS ART THROMBECTOMY/INFUSION INTRACRANIAL INC DIAG ANGIO  01/08/2022   IRRIGATION AND DEBRIDEMENT FOOT Left 09/04/2021   Procedure: IRRIGATION AND DEBRIDEMENT FOOT AND CLOSURE;  Surgeon: Criselda Peaches, DPM;  Location: [place];  Service: Podiatry;  Laterality: Left;   RADIOLOGY WITH ANESTHESIA N/A 01/08/2022    Procedure: IR WITH ANESTHESIA;  Surgeon: Radiologist, Medication, MD;  Location: [place];  Service: Radiology;  Laterality: N/A;   THROMBECTOMY BRACHIAL ARTERY Right 11/18/2021   Procedure: RIGHT BRACHIAL, RADIAL, & ULNAR ARTERY THROMBECTOMY.;  Surgeon: Marty Heck, MD;  Location: [place] [facility];  Service: Vascular;  Laterality: Right;   TUBAL LIGATION     Past Medical History:  Diagnosis Date   Anemia    CAD (coronary artery disease)    a. s/p cath in 03/2014 showing 30% mid-LAD, moderate to severe disease along small D1, patent LCx, moderate to severe distal OM2 stenosis and moderate diffuse diease along RCA not amenable to PCI   CHF (congestive heart failure) ([place])    a. EF 55-60% in 12/2019 b. EF at 35-40% by echo in 05/2020   CKD (chronic kidney disease) stage 4, GFR 15-29 ml/min (HCC)    Diabetes mellitus without complication (HCC)    Myocardial infarction ([place])    Neuropathy    Stroke ([place])    BP 139/87   Pulse 85   Ht 5\' 5"  (1.651 m)   Wt 202 lb (91.6 kg)   SpO2 99%   BMI 33.61 kg/m   Opioid Risk Score:   Fall Risk Score:  `1  Depression screen [facility] 2/9     04/19/2022    2:32 PM 04/05/2022   11:05 AM 03/03/2022    1:35 PM 03/03/2022   10:08 AM 12/22/2021    6:14 PM 12/15/2021    4:29 PM 12/15/2021    4:02 PM  Depression screen PHQ 2/9  Decreased Interest 0 0 0 0 0 0 0  Down, Depressed, Hopeless 0 0 0 0 1 1 1  PHQ - 2 Score 0 0 0 0 1 1 1   Altered sleeping  2 0      Tired, decreased energy  0 0      Change in appetite  0 0      Feeling bad or failure about yourself   0 0      Trouble concentrating  1 0      Moving slowly or fidgety/restless  0 0      Suicidal thoughts  0 0      PHQ-9 Score  3 0          Review of Systems  Musculoskeletal:        Shoulder pain Wrist Pain Hand Pain Finger Pain       Objective:   Physical Exam Vitals:   04/19/22 1432  BP: 139/87  Pulse: 85  SpO2: 99%     Gen: no distress, normal appearing HEENT: oral mucosa pink  and moist, NCAT Cardio: Reg rate Chest: normal effort, normal rate of breathing Abd: soft, non-distended Ext: Mild edema in her left upper extremity Psych: pleasant, normal affect Skin: intact Neuro: Alert and awake, makes eye contact, follows commands, using post-op shoe on the left, left facial weakness Strength 5 out of 5 right upper extremity and right lower extremity Strength 0 out of 5 left shoulder abduction and elbow extension, elbow flexion 2-3 out of 5, finger flexion 3-4 out of 5 Strength left lower extremity 4 out of 5 throughout -Sensation intact light touch in all 4 extremities Musculoskeletal: Subluxation left shoulder Increased tone in finger flexors MAS 2-3, elbow flexors and pronators 2,            Assessment & Plan:   1. Right MCA infarct status post thrombectomy, right ICA dissection with stent placement.             -Patient has had significant improvement since her discharge and is now ambulating without assistive device.  She continues to have significant weakness in her proximal left arm.  She has completed her home therapy services.  2 shoulder subluxation left  -Will order Voltaren gel, can consider shoulder cortisone injection however caution due to diabetes  3 Spasticity left upper extremity.   -Continue to monitor may need Botox at a later time.  She is not interested in Botox injection at this time because she says spasticity is not causing her significant pain or functional deficits currently  4. Hypertension  -Reports well-controlled continue follow-up with PCP   5.  Hyperlipidemia  -Continue statin  5: Left foot osteomyelitis s/p ray amputation  -Continue follow-up with [name]

## 2022-04-19 NOTE — Telephone Encounter (Signed)
Pharmacy Patient Advocate Encounter  Prior Authorization for Omnipod 5 G6  has been approved through [facility]  key# [id number] Effective dates: 04/19/22 through 04/18/23

## 2022-04-19 NOTE — Telephone Encounter (Signed)
Has any of the PA team seen anything on this patient. A coworker has ask that a request be sent with the patient's drug insurance information. There is concern as the patient Omnipod will be finished at 9 pm tonight.

## 2022-04-20 ENCOUNTER — Telehealth: Payer: Self-pay | Admitting: Cardiology

## 2022-04-20 ENCOUNTER — Ambulatory Visit (INDEPENDENT_AMBULATORY_CARE_PROVIDER_SITE_OTHER): Payer: BC Managed Care – PPO | Admitting: Internal Medicine

## 2022-04-20 ENCOUNTER — Encounter: Payer: Self-pay | Admitting: Internal Medicine

## 2022-04-20 VITALS — BP 133/84 | HR 77 | Ht 65.0 in | Wt 206.0 lb

## 2022-04-20 DIAGNOSIS — S31109A Unspecified open wound of abdominal wall, unspecified quadrant without penetration into peritoneal cavity, initial encounter: Secondary | ICD-10-CM | POA: Diagnosis not present

## 2022-04-20 DIAGNOSIS — I63511 Cerebral infarction due to unspecified occlusion or stenosis of right middle cerebral artery: Secondary | ICD-10-CM

## 2022-04-20 DIAGNOSIS — E11621 Type 2 diabetes mellitus with foot ulcer: Secondary | ICD-10-CM | POA: Diagnosis not present

## 2022-04-20 DIAGNOSIS — Z0001 Encounter for general adult medical examination with abnormal findings: Secondary | ICD-10-CM | POA: Diagnosis not present

## 2022-04-20 DIAGNOSIS — Z1159 Encounter for screening for other viral diseases: Secondary | ICD-10-CM | POA: Diagnosis not present

## 2022-04-20 DIAGNOSIS — L97529 Non-pressure chronic ulcer of other part of left foot with unspecified severity: Secondary | ICD-10-CM

## 2022-04-20 LAB — PNH PROFILE (-HIGH SENSITIVITY)

## 2022-04-20 NOTE — Telephone Encounter (Signed)
Patient was called and made aware. 

## 2022-04-20 NOTE — Telephone Encounter (Signed)
  Pt c/o medication issue:  1. Name of Medication: enoxaparin (LOVENOX) 120 MG/0.8ML injection   2. How are you currently taking this medication (dosage and times per day)? Inject 0.8 mLs (120 mg total) into the skin daily.   3. Are you having a reaction (difficulty breathing--STAT)? No   4. What is your medication issue? Kristin with Dr. Mee Hives office. They would like to know if pt can be taking of lovenox and changed it to pill form

## 2022-04-20 NOTE — Patient Instructions (Signed)
It was a pleasure to see you today.  Thank you for giving [name] the opportunity to be involved in your care.  Below is a brief recap of your visit and next steps.  We will plan to see you again in 6 weeks.  Summary I have placed a referral to wound care today We will coordinate establishing care with the coumadin clinic Follow up in 6 weeks

## 2022-04-20 NOTE — Progress Notes (Signed)
Established Patient Office Visit  Subjective   Patient ID: Lauren [name], female    DOB: [birth year]  Age: 41 y.o. MRN: [id number]  Chief Complaint  Patient presents with   Diabetes    Follow up   Wound Check    Spot on lower left side of stomach, red, open, sore. Noticed 04/12/2022 and opened 04/18/2022.   [name] returns to care today.  She was last seen by me on 12/4 to establish care.  Repeat labs were ordered, medications were refilled, and she was referred to nephrology to establish care.  In the interim she has been seen for follow-up by podiatry, hematology, and PM&R. [name] acute concern today is a superficial open wound on her stomach that has developed following Lovenox injections.  She also reports that she is ready to transition from therapeutic Lovenox to warfarin.  This was recently discussed with hematology (Dr. Chryl Heck) at her follow-up appointment.  Past Medical History:  Diagnosis Date   Anemia    CAD (coronary artery disease)    a. s/p cath in 03/2014 showing 30% mid-LAD, moderate to severe disease along small D1, patent LCx, moderate to severe distal OM2 stenosis and moderate diffuse diease along RCA not amenable to PCI   CHF (congestive heart failure) ([place])    a. EF 55-60% in 12/2019 b. EF at 35-40% by echo in 05/2020   CKD (chronic kidney disease) stage 4, GFR 15-29 ml/min (HCC)    Diabetes mellitus without complication ([place])    Myocardial infarction ([place])    Neuropathy    Stroke [facility])    Past Surgical History:  Procedure Laterality Date   AMPUTATION Left 09/02/2021   Procedure: AMPUTATION RAY;  Surgeon: Criselda Peaches, DPM;  Location: [facility];  Service: Podiatry;  Laterality: Left;  sagittal saw, 3L bag saline & Pulse   Cardiac catherization     CHOLECYSTECTOMY     I & D EXTREMITY Left 08/30/2021   Procedure: IRRIGATION AND DEBRIDEMENT WITH BONE BIOPSY;  Surgeon: [name], DPM;  Location: [facility];  Service: Podiatry;  Laterality: Left;   IR ANGIO  VERTEBRAL SEL SUBCLAVIAN INNOMINATE UNI L MOD SED  01/18/2022   IR CT HEAD LTD  01/08/2022   IR INTRA CRAN STENT  01/08/2022   IR PERCUTANEOUS ART THROMBECTOMY/INFUSION INTRACRANIAL INC DIAG ANGIO  01/08/2022   IRRIGATION AND DEBRIDEMENT FOOT Left 09/04/2021   Procedure: IRRIGATION AND DEBRIDEMENT FOOT AND CLOSURE;  Surgeon: Criselda Peaches, DPM;  Location: [facility];  Service: Podiatry;  Laterality: Left;   RADIOLOGY WITH ANESTHESIA N/A 01/08/2022   Procedure: IR WITH ANESTHESIA;  Surgeon: Radiologist, Medication, MD;  Location: [facility];  Service: Radiology;  Laterality: N/A;   THROMBECTOMY BRACHIAL ARTERY Right 11/18/2021   Procedure: RIGHT BRACHIAL, RADIAL, & ULNAR ARTERY THROMBECTOMY.;  Surgeon: Marty Heck, MD;  [facility];  Service: Vascular;  Laterality: Right;   TUBAL LIGATION     Social History   Tobacco Use   Smoking status: Former    Packs/day: 0.25    Types: Cigarettes    Quit date: 10/27/2020    Years since quitting: 1.5    Passive exposure: Past   Smokeless tobacco: Never  Vaping Use   Vaping Use: Never used  Substance Use Topics   Alcohol use: No    Alcohol/week: 0.0 standard drinks of alcohol   Drug use: No   Family History  Problem Relation Age of Onset   Thyroid disease Mother  Hypertension Mother    Hyperlipidemia Mother    CAD Mother    CVA Mother    Hyperlipidemia Father    Hypertension Father    CAD Father    Breast cancer Paternal Grandmother 16   Cancer Paternal Grandmother        lung   Allergies  Allergen Reactions   Wellbutrin [Bupropion] Hives   Cefepime Rash    Tolerates penicilllin   Ciprofloxacin Hcl Hives and Rash    Hives/rash at injection site; 01/15/22 tolerated IV cipro   Tape Rash   Review of Systems  Skin:        Abdominal wound  All other systems reviewed and are negative.     Objective:     BP 133/84   Pulse 77   Ht 5\' 5"  (1.651 m)   Wt 206 lb (93.4 kg)   SpO2 98%   BMI 34.28 kg/m  BP Readings from Last 3  Encounters:  04/20/22 133/84  04/19/22 139/87  04/05/22 116/74      Physical Exam Vitals reviewed.  Constitutional:      Comments: Examined in wheelchair, chronically ill appearing  HENT:     Head: Normocephalic and atraumatic.     Right Ear: External ear normal.     Left Ear: External ear normal.     Nose: Nose normal.     Mouth/Throat:     Mouth: Mucous membranes are moist.     Pharynx: Oropharynx is clear. No oropharyngeal exudate or posterior oropharyngeal erythema.  Eyes:     General: No scleral icterus.    Extraocular Movements: Extraocular movements intact.     Conjunctiva/sclera: Conjunctivae normal.     Pupils: Pupils are equal, round, and reactive to light.  Cardiovascular:     Rate and Rhythm: Normal rate and regular rhythm.     Pulses: Normal pulses.     Heart sounds: Murmur heard.  Pulmonary:     Effort: Pulmonary effort is normal.     Breath sounds: Normal breath sounds. No wheezing, rhonchi or rales.  Abdominal:     General: Abdomen is flat. Bowel sounds are normal.     Palpations: Abdomen is soft.  Musculoskeletal:        General: Signs of injury (Left foot wound bandage in place) present.     Cervical back: Normal range of motion.  Skin:    General: Skin is warm and dry.     Findings: Lesion (open, superficial abdominal wound over left lower quadrant of abdomen. No active bleeding or purulent material appreciated) present.  Neurological:     Mental Status: She is alert and oriented to person, place, and time.     Cranial Nerves: No cranial nerve deficit.     Motor: Weakness (Left arm and leg.  Weakness is more prominent in the left upper extremity) present.  Psychiatric:        Mood and Affect: Mood normal.        Behavior: Behavior normal.    Last CBC Lab Results  Component Value Date   WBC 9.2 04/20/2022   HGB 11.0 (L) 04/20/2022   HCT 36.0 04/20/2022   MCV 90 04/20/2022   MCH 27.4 04/20/2022   RDW 14.5 04/20/2022   PLT 415 69/79/4801    Last metabolic panel Lab Results  Component Value Date   GLUCOSE 261 (H) 04/20/2022   NA 135 04/20/2022   K 5.5 (H) 04/20/2022   CL 102 04/20/2022   CO2 20 04/20/2022  BUN 24 (H) 04/20/2022   CREATININE 2.16 (H) 04/20/2022   GFRNONAA 28 (L) 04/15/2022   CALCIUM 8.4 (L) 04/20/2022   PHOS 4.4 01/29/2022   PROT 6.3 04/20/2022   ALBUMIN 3.1 (L) 04/20/2022   LABGLOB 3.2 04/20/2022   AGRATIO 1.0 (L) 04/20/2022   BILITOT <0.2 04/20/2022   ALKPHOS 102 04/20/2022   AST 20 04/20/2022   ALT 24 04/20/2022   ANIONGAP 7 04/15/2022   Last lipids Lab Results  Component Value Date   CHOL 142 04/20/2022   HDL 37 (L) 04/20/2022   LDLCALC 81 04/20/2022   LDLDIRECT 84.9 01/01/2020   TRIG 133 04/20/2022   CHOLHDL 3.8 04/20/2022   Last hemoglobin A1c Lab Results  Component Value Date   HGBA1C 8.0 (H) 01/09/2022   Last thyroid functions Lab Results  Component Value Date   TSH 17.600 (H) 04/20/2022   Last vitamin B12 and Folate Lab Results  Component Value Date   ZJIRCVEL38 101 04/20/2022   FOLATE 10.3 04/20/2022     Assessment & Plan:   Problem List Items Addressed This Visit       Acute right MCA stroke [facility])    Neurology follow-up scheduled for 12/28.  She was discharged from rehab on Lovenox/Brilinta and Lipitor.  Today she expresses an interest in transitioning from Lovenox to warfarin. -Will coordinate transition to warfarin with anticoagulation clinic and hematology -Continue current medications for now      Ulcer of left foot due to type 2 diabetes mellitus ([place])    Followed by podiatry ([name]).  She is currently prescribed Augmentin.  Her left foot is bandaged today.  Podiatry follow-up scheduled for 05/04/2022.      Open abdominal wall wound, initial encounter    Superficial abdominal wall wound noted on exam today.  No evidence of active bleeding or purulent material appreciated.  This is likely the result of Lovenox injections.  -I have placed a  referral to wound care today.  We will also work on transitioning to warfarin.      Return in about 6 weeks (around 06/01/2022).    Johnette Abraham, MD

## 2022-04-20 NOTE — Telephone Encounter (Signed)
Noted and the patient has been called.

## 2022-04-20 NOTE — Telephone Encounter (Signed)
Per oncology note from 03/12/22 patient can either continue on Lovenox or transition of warfarin. At that time, patient preferred to continue with Lovenox.  If patient wants to change to warfarin, it would be up to oncology if they want to manage her INR or our coumadin clinic.

## 2022-04-21 ENCOUNTER — Other Ambulatory Visit: Payer: Self-pay | Admitting: Internal Medicine

## 2022-04-21 DIAGNOSIS — E559 Vitamin D deficiency, unspecified: Secondary | ICD-10-CM

## 2022-04-21 LAB — CMP14+EGFR
ALT: 24 IU/L (ref 0–32)
AST: 20 IU/L (ref 0–40)
Albumin/Globulin Ratio: 1 — ABNORMAL LOW (ref 1.2–2.2)
Albumin: 3.1 g/dL — ABNORMAL LOW (ref 3.9–4.9)
Alkaline Phosphatase: 102 IU/L (ref 44–121)
BUN/Creatinine Ratio: 11 (ref 9–23)
BUN: 24 mg/dL — ABNORMAL HIGH (ref 6–20)
Bilirubin Total: <0.2 mg/dL (ref 0.0–1.2)
CO2: 20 mmol/L (ref 20–29)
Calcium: 8.4 mg/dL — ABNORMAL LOW (ref 8.7–10.2)
Chloride: 102 mmol/L (ref 96–106)
Creatinine, Ser: 2.16 mg/dL — ABNORMAL HIGH (ref 0.57–1.00)
Globulin, Total: 3.2 g/dL (ref 1.5–4.5)
Glucose: 261 mg/dL — ABNORMAL HIGH (ref 70–99)
Potassium: 5.5 mmol/L — ABNORMAL HIGH (ref 3.5–5.2)
Sodium: 135 mmol/L (ref 134–144)
Total Protein: 6.3 g/dL (ref 6.0–8.5)
eGFR: 29 mL/min/{1.73_m2} — ABNORMAL LOW (ref >59)

## 2022-04-21 LAB — CBC WITH DIFFERENTIAL/PLATELET
Basophils Absolute: 0 10*3/uL (ref 0.0–0.2)
Basos: 0 %
EOS (ABSOLUTE): 0.2 10*3/uL (ref 0.0–0.4)
Eos: 3 %
Hematocrit: 36 % (ref 34.0–46.6)
Hemoglobin: 11 g/dL — ABNORMAL LOW (ref 11.1–15.9)
Immature Grans (Abs): 0 10*3/uL (ref 0.0–0.1)
Immature Granulocytes: 0 %
Lymphocytes Absolute: 1.6 10*3/uL (ref 0.7–3.1)
Lymphs: 17 %
MCH: 27.4 pg (ref 26.6–33.0)
MCHC: 30.6 g/dL — ABNORMAL LOW (ref 31.5–35.7)
MCV: 90 fL (ref 79–97)
Monocytes Absolute: 0.6 10*3/uL (ref 0.1–0.9)
Monocytes: 7 %
Neutrophils Absolute: 6.7 10*3/uL (ref 1.4–7.0)
Neutrophils: 73 %
Platelets: 415 10*3/uL (ref 150–450)
RBC: 4.01 x10E6/uL (ref 3.77–5.28)
RDW: 14.5 % (ref 11.7–15.4)
WBC: 9.2 10*3/uL (ref 3.4–10.8)

## 2022-04-21 LAB — HCV INTERPRETATION

## 2022-04-21 LAB — TSH+FREE T4
Free T4: 1.12 ng/dL (ref 0.82–1.77)
TSH: 17.6 u[IU]/mL — ABNORMAL HIGH (ref 0.450–4.500)

## 2022-04-21 LAB — LIPID PANEL
Chol/HDL Ratio: 3.8 ratio (ref 0.0–4.4)
Cholesterol, Total: 142 mg/dL (ref 100–199)
HDL: 37 mg/dL — ABNORMAL LOW (ref >39)
LDL Chol Calc (NIH): 81 mg/dL (ref 0–99)
Triglycerides: 133 mg/dL (ref 0–149)
VLDL Cholesterol Cal: 24 mg/dL (ref 5–40)

## 2022-04-21 LAB — B12 AND FOLATE PANEL
Folate: 10.3 ng/mL (ref >3.0)
Vitamin B-12: 433 pg/mL (ref 232–1245)

## 2022-04-21 LAB — HCV AB W REFLEX TO QUANT PCR: HCV Ab: NONREACTIVE

## 2022-04-21 LAB — VITAMIN D 25 HYDROXY (VIT D DEFICIENCY, FRACTURES): Vit D, 25-Hydroxy: 14.4 ng/mL — ABNORMAL LOW (ref 30.0–100.0)

## 2022-04-21 MED ORDER — WARFARIN SODIUM 5 MG PO TABS: 5.0000 mg | ORAL_TABLET | Freq: Every evening | ORAL | 0 refills | Status: DC

## 2022-04-21 MED ORDER — VITAMIN D (ERGOCALCIFEROL) 1.25 MG (50000 UNIT) PO CAPS: 50000.0000 [IU] | ORAL_CAPSULE | ORAL | 0 refills | Status: DC

## 2022-04-21 NOTE — Addendum Note (Signed)
Addended by: Smitty Knudsen on: 04/21/2022 11:18 AM   Modules accepted: Orders

## 2022-04-21 NOTE — Telephone Encounter (Signed)
Spoke with patient. Scheduled pt in coumadin clinic in [place] on 12/27 @ 10:00AM. Will start warfarin on 12/22. Will overlap lovenox and warfarin until INR is therapeutic. Rx for warfarin 5mg  in the evening sent to her pharamacy.  Will give further instructions once INR is checked 12/27

## 2022-04-23 ENCOUNTER — Ambulatory Visit: Payer: Self-pay | Admitting: *Deleted

## 2022-04-23 ENCOUNTER — Encounter: Payer: Self-pay | Admitting: *Deleted

## 2022-04-23 LAB — MISC LABCORP TEST (SEND OUT): Labcorp test code: 489555

## 2022-04-23 NOTE — Patient Instructions (Signed)
Visit Information  Thank you for taking time to visit with me today. Please don't hesitate to contact me if I can be of assistance to you.   Following are the goals we discussed today:   Goals Addressed               This Visit's Progress     Assist with Applying for Medicaid, Lawyer and [place]. (pt-stated)   On track     Care Coordination Interventions:  Active listening/reflection utilized. Solution-focused strategies employed. Task-centered interventions developed. Caregiver support provided. Caregiver stress acknowledged. Caregiver resources reviewed & self-enrollment encouraged. Continue to receive home health nursing, physical therapy, occupational therapy and speech therapy services, through [facility]. Continue to await approval/denial letter from [employer], Programme researcher, broadcasting/film/video for Social Security Disability.   ~ [place], through Engineering geologist at [employer], to assist with approval process for [place], through [employer]. CSW collaboration with representative from [employer], to [place] Application is still pending with [employer], as of 04/23/2022. CSW will assist with re-applying for Adult Medicaid, through the [place], if approved for [place], through [employer].   CSW will assist with application completion and submission for [place], through [facility], if approved for Adult Medicaid, through the [place].       Our next appointment is by telephone on 05/07/2022 at 9:45 am.  Please call the care guide team at [phone] if you need to cancel or  reschedule your appointment.   If you are experiencing a Mental Health or [place] or need someone to talk to, please call the Suicide and Crisis Lifeline: 988 call the [place] National Suicide Prevention Lifeline: 571-336-4821 or TTY: 8635816218 TTY 612-328-3823) to talk to a trained counselor call 1-800-273-TALK (toll free, 24 hour hotline) go to [facility] [address] [place] [phone]) call the [place]: 828-573-0822 call 911  Patient verbalizes understanding of instructions and care plan provided today and agrees to view in [place]. Active MyChart status and patient understanding of how to access instructions and care plan via MyChart confirmed with patient.     Telephone follow up appointment with care management team member scheduled for:  05/07/2022 at 9:45 am.  Lauren [name], Lauren [name], Lauren [name], [place]  Licensed Clinical Social Worker  [place]  Mailing [place]. [address] [place], [place] [address] Physical Address-[address], [place], [place] [address] Toll Free Main # [phone] Fax # [phone] Cell # [phone] [email or web address]

## 2022-04-23 NOTE — Patient Outreach (Signed)
  Care Coordination   Follow Up Visit Note   04/23/2022  Name: Lauren [name] MRN: [id number] DOB: 04-03-1981  Lauren [name] is a 41 y.o. year old female who sees [name], [name], MD for primary care. I spoke with [name] by phone today.  What matters to the patients health and wellness today?   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   Goals Addressed               This Visit's Progress     Assist with Applying for Medicaid, [facility] and [place]. (pt-stated)   On track     Care Coordination Interventions:  Active listening/reflection utilized. Solution-focused strategies employed. Task-centered interventions developed. Caregiver support provided. Caregiver stress acknowledged. Caregiver resources reviewed & self-enrollment encouraged. Continue to receive home health nursing, physical therapy, occupational therapy and speech therapy services, through [facility]. Continue to await approval/denial letter from [employer], Programme researcher, broadcasting/film/video for Social Security Disability.   ~ [place], through Engineering geologist at [employer], to assist with approval process for [place], through [employer]. CSW collaboration with representative from [employer], to [place] Application is still pending with [employer], as of 04/23/2022. CSW will assist with re-applying for Adult Medicaid, through the [place], if approved for [place], through [employer].   CSW will assist with application completion and submission for [place], through [facility], if approved for  Adult Medicaid, through the [place].       SDOH assessments and interventions completed:  Yes.  Care Coordination Interventions:  Yes, provided.   Follow up plan: Follow up call scheduled for 05/07/2022 at 9:45 am.  Encounter Outcome:  Pt. Visit Completed.   Nat Christen, BSW, MSW, LCSW  Licensed Education officer, [facility]  Mailing [place] [address] [place], [place] [address] Physical Address-[address], [place], [place] [address] Toll Free Main # [phone] Fax # [phone] Cell # [phone] [email or web address]

## 2022-04-28 ENCOUNTER — Ambulatory Visit: Payer: BC Managed Care – PPO | Attending: Cardiology | Admitting: *Deleted

## 2022-04-28 ENCOUNTER — Telehealth: Payer: Self-pay | Admitting: *Deleted

## 2022-04-28 DIAGNOSIS — I639 Cerebral infarction, unspecified: Secondary | ICD-10-CM | POA: Diagnosis not present

## 2022-04-28 DIAGNOSIS — Z5181 Encounter for therapeutic drug level monitoring: Secondary | ICD-10-CM

## 2022-04-28 DIAGNOSIS — S31109A Unspecified open wound of abdominal wall, unspecified quadrant without penetration into peritoneal cavity, initial encounter: Secondary | ICD-10-CM | POA: Insufficient documentation

## 2022-04-28 LAB — POCT INR: INR: 1 — AB (ref 2.0–3.0)

## 2022-04-28 NOTE — Telephone Encounter (Signed)
Saw pt today as a new coumadin patient.  Daughter states they cannot afford co-pay for Brilinta.  Please call to discuss assistance or other options.   Daughter: Shakthi Scipio [phone]  I gave patient 1 1/2 weeks samples of Brilinta 90mg  today.

## 2022-04-28 NOTE — Assessment & Plan Note (Signed)
Superficial abdominal wall wound noted on exam today.  No evidence of active bleeding or purulent material appreciated.  This is likely the result of Lovenox injections.  -I have placed a referral to wound care today.  We will also work on transitioning to warfarin.

## 2022-04-28 NOTE — Assessment & Plan Note (Signed)
Followed by podiatry ([name]).  She is currently prescribed Augmentin.  Her left foot is bandaged currently.  Podiatry follow-up scheduled for 05/04/2022.

## 2022-04-28 NOTE — Assessment & Plan Note (Signed)
Neurology follow-up scheduled for 12/28.  She was discharged from rehab on Lovenox/Brilinta and Lipitor.  Today she expresses an interest in transitioning from Lovenox to warfarin. -Will coordinate transition to warfarin with anticoagulation clinic and hematology -Continue current medications for now

## 2022-04-28 NOTE — Patient Instructions (Signed)
Picked up Rx for warfarin but has not started it yet. Start warfarin 5mg  daily tonight. Continue Lovenox 120mg   SQ daily until INR 2.0 or > Pt teaching done. Pt and daughter verbalized understanding.

## 2022-04-29 ENCOUNTER — Ambulatory Visit (INDEPENDENT_AMBULATORY_CARE_PROVIDER_SITE_OTHER): Payer: BC Managed Care – PPO | Admitting: Neurology

## 2022-04-29 ENCOUNTER — Encounter: Payer: Self-pay | Admitting: Neurology

## 2022-04-29 ENCOUNTER — Other Ambulatory Visit: Payer: Self-pay

## 2022-04-29 ENCOUNTER — Emergency Department (HOSPITAL_COMMUNITY)
Admission: EM | Admit: 2022-04-29 | Discharge: 2022-04-29 | Discharge status: Against Medical Advice | Payer: BC Managed Care – PPO

## 2022-04-29 VITALS — BP 142/91 | HR 99

## 2022-04-29 DIAGNOSIS — Z95828 Presence of other vascular implants and grafts: Secondary | ICD-10-CM

## 2022-04-29 DIAGNOSIS — I69359 Hemiplegia and hemiparesis following cerebral infarction affecting unspecified side: Secondary | ICD-10-CM | POA: Diagnosis not present

## 2022-04-29 DIAGNOSIS — I63411 Cerebral infarction due to embolism of right middle cerebral artery: Secondary | ICD-10-CM | POA: Diagnosis not present

## 2022-04-29 DIAGNOSIS — I639 Cerebral infarction, unspecified: Secondary | ICD-10-CM | POA: Diagnosis not present

## 2022-04-29 MED ORDER — CLOPIDOGREL BISULFATE 75 MG PO TABS: 75.0000 mg | ORAL_TABLET | Freq: Every day | ORAL | 11 refills | Status: DC

## 2022-04-29 NOTE — ED Notes (Signed)
Pt left before triage, all charting was done on wrong pt

## 2022-04-29 NOTE — Progress Notes (Signed)
[facility] [address] [place]. [place] [address] [phone]       OFFICE CONSULT NOTE  [name] Date of Birth:  [birth year] Medical Record Number:  [id number]   Referring MD: Rosalin Hawking  Reason for Referral: Stroke  HPI : [name] is a pleasant 41 year old Caucasian lady seen today for initial office consultation visit for stroke.  She is accompanied by her daughter and son-in-law.  History is obtained from them and review of electronic medical records.  I personally reviewed pertinent available imaging films in PACS.  She has past medical history of coronary artery disease, congestive heart failure, chronic kidney disease, diabetes, myocardial infarction peripheral artery disease.  She was seen on 01/08/2022 by Dr. Curly Shores for fluctuating left-sided weakness with NIH stroke scale rating from 4-9 without a definite clear last known well.  CT scan on admission showed periventricular white matter hypodensities adjacent to the right lateral ventricle suggestive of acute ischemia however aspect score was 10.  CT angiogram showed age-indeterminate occlusion of the proximal right ICA in the neck and intracranial right ICA and ACA were patent but diminutive caliber.  CT perfusion surprisingly showed no evidence of core infarct and a penumbra of 38 mm.  After discussion of risk benefits with patient and family he underwent mechanical thrombectomy successfully but complicated by right carotid long-segment dissection from proximal one third to petrous cavernous junction requiring rescue telescopic 3 pipeline stent placement.  MRI scan of the brain subsequently showed extensive patchy infarcts in the right MCA territory.  2D echo showed ejection fraction of 30 to 35% with Was admitted to vitals septal LV segments.  LDL cholesterol was 39 mg percent.  Hemoglobin A1c was 8.0.  Hypercoagulable panel labs were negative except for abnormal lupus anticoagulant which was felt to be  related to IV heparin.  Subsequently this was repeated on 04/19/2022 and was negative.  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  She was placed on IV heparin will be switched to full dose Lovenox and Brilinta for her stent.  He was transferred to inpatient rehab and is currently at home.  She is finished home physical occupational therapies.  She is able to ambulate but gets tired.  She still has weakness in the left hand most please she can bend her fingers but has decreased grip strength and fine motor skills.  She can walk with her left foot brace but has mild spasticity on the left.  She follows with Dr. Marciano Sequin rehab MD. . She also saw Dr. Chryl Heck who hematologist who recommended switching Lovenox injections to warfarin due to patient having injection site reactions and wound.  She is also having time affording the co-pay for Brilinta.  PMH:  Past Medical History:  Diagnosis Date   Anemia    CAD (coronary artery disease)    a. s/p cath in 03/2014 showing 30% mid-LAD, moderate to severe disease along small D1, patent LCx, moderate to severe distal OM2 stenosis and moderate diffuse diease along RCA not amenable to PCI   CHF (congestive heart failure) ([place])    a. EF 55-60% in 12/2019 b. EF at 35-40% by echo in 05/2020   CKD (chronic kidney disease) stage 4, GFR 15-29 ml/min (HCC)    Diabetes mellitus without complication ([place])    Myocardial infarction ([place])    Neuropathy    Stroke [facility])     Social History:  Social History  Socioeconomic History   Marital status: Married    Spouse name: Jerelyn Scott   Number of children: 2   Years of education: 12   Highest education level: 12th grade  Occupational History    Comment: Full time   Occupation: CNA at  [employer]   Smoking status: Former    Packs/day: 0.25    Types: Cigarettes    Quit date: 10/27/2020    Years since quitting: 1.5    Passive exposure:  Past   Smokeless tobacco: Never  Vaping Use   Vaping Use: Never used  Substance and Sexual Activity   Alcohol use: No    Alcohol/week: 0.0 standard drinks of alcohol   Drug use: No   Sexual activity: Yes    Birth control/protection: Surgical    Comment: tubal   Other Topics Concern   Not on file  Social History Narrative   Lives with husband and kids   Right handed   Drinks 9+ cups caffeine daily   Social Determinants of Health   Financial Resource Strain: Low Risk  (03/03/2022)   Overall Financial Resource Strain (CARDIA)    Difficulty of Paying Living Expenses: Not very hard  Recent Concern: Emergency planning/management officer Strain - High Risk (01/14/2022)   Overall Financial Resource Strain (CARDIA)    Difficulty of Paying Living Expenses: Hard  Food Insecurity: No Food Insecurity (03/03/2022)   Hunger Vital Sign    Worried About Running Out of Food in the Last Year: Never true    [place] in the Last Year: Never true  Recent Concern: [place] Present (01/16/2022)   Hunger Vital Sign    Worried About [place] in the Last Year: Sometimes true    Ran Out of Food in the Last Year: Sometimes true  Transportation Needs: No Transportation Needs (03/03/2022)   PRAPARE - Hydrologist (Medical): No    Lack of Transportation (Non-Medical): No  Physical Activity: Inactive (03/03/2022)   Exercise Vital Sign    Days of Exercise per Week: 0 days    Minutes of Exercise per Session: 0 min  Stress: No Stress Concern Present (03/03/2022)   [place]    Feeling of Stress : Only a little  Recent Concern: Stress - Stress Concern Present (12/22/2021)   [place]    Feeling of Stress : To some extent  Social Connections: Socially Integrated (03/03/2022)   Social Connection and Isolation Panel [NHANES]     Frequency of Communication with Friends and Family: More than three times a week    Frequency of Social Gatherings with Friends and Family: More than three times a week    Attends Religious Services: More than 4 times per year    Active Member of [employer] or Organizations: Yes    Attends Music therapist: More than 4 times per year    Marital Status: Married  Human resources officer Violence: Not At Risk (03/03/2022)   Humiliation, Afraid, Rape, and Kick questionnaire    Fear of Current or Ex-Partner: No    Emotionally Abused: No    Physically Abused: No    Sexually Abused: No    Medications:   Current Outpatient Medications on File Prior to Visit  Medication Sig Dispense Refill   acetaminophen (TYLENOL) 325 MG tablet Take 1-2 tablets (325-650 mg total) by mouth every 4 (four)  hours as needed for mild pain. 100 tablet 0   amoxicillin-clavulanate (AUGMENTIN) 875-125 MG tablet Take 1 tablet by mouth 2 (two) times daily. 28 tablet 0   atorvastatin (LIPITOR) 40 MG tablet Take 1 tablet (40 mg total) by mouth daily. Courtesy fill/ pt to get established with a primary MD for further refills 30 tablet 0   Continuous Blood Gluc Sensor (DEXCOM G6 SENSOR) MISC Change sensor every 10 days as directed 9 each 3   diclofenac Sodium (VOLTAREN ARTHRITIS PAIN) 1 % GEL Apply 2 g topically 4 (four) times daily. 150 g 5   enoxaparin (LOVENOX) 120 MG/0.8ML injection Inject 0.8 mLs (120 mg total) into the skin daily. 24 mL 6   furosemide (LASIX) 40 MG tablet Take 40 mg daily as needed for swelling 90 tablet 3   insulin aspart (NOVOLOG) 100 UNIT/ML injection Use with Omnipod for daily dose around 60 units daily 40 mL 3   Insulin Disposable Pump (OMNIPOD 5 G6 POD, GEN 5,) MISC Change pod every 48-72 hours 6 each 3   isosorbide mononitrate (ISMO) 20 MG tablet Take 1 tablet (20 mg total) by mouth 2 (two) times daily. Courtesy fill/ pt to get established with a primary MD for further refills 60 tablet 0    levothyroxine (SYNTHROID) 125 MCG tablet Take 1 tablet (125 mcg total) by mouth daily at 6 (six) AM. Courtesy fill/ pt to get established with a primary MD for further refills 30 tablet 0   magnesium oxide (MAG-OX) 400 MG tablet Take 0.5 tablets (200 mg total) by mouth at bedtime. 15 tablet 0   methocarbamol (ROBAXIN) 500 MG tablet Take 1 tablet (500 mg total) by mouth every 6 (six) hours as needed for muscle spasms. 60 tablet 0   metoprolol tartrate (LOPRESSOR) 25 MG tablet Take 1.5 tablets (37.5 mg total) by mouth 2 (two) times daily. 270 tablet 2   Multiple Vitamin (MULTIVITAMIN WITH MINERALS) TABS tablet Take 1 tablet by mouth daily.     nicotine (NICODERM CQ - DOSED IN MG/24 HOURS) 21 mg/24hr patch Place 1 patch (21 mg total) onto the skin daily. 28 patch 0   nitroGLYCERIN (NITROSTAT) 0.4 MG SL tablet Place 1 tablet (0.4 mg total) under the tongue every 5 (five) minutes x 3 doses as needed for chest pain. 25 tablet 3   ondansetron (ZOFRAN) 4 MG tablet Take 1 tablet (4 mg total) by mouth every 8 (eight) hours as needed for nausea or vomiting. 90 tablet 0   pantoprazole (PROTONIX) 40 MG tablet Take 1 tablet (40 mg total) by mouth daily. Courtesy fill/ pt to get established with a primary MD for further refills 30 tablet 0   polyethylene glycol (MIRALAX / GLYCOLAX) 17 g packet Take 17 g by mouth 2 (two) times daily. 60 each 0   ramelteon (ROZEREM) 8 MG tablet Take 1 tablet (8 mg total) by mouth at bedtime. 30 tablet 0   ranolazine (RANEXA) 500 MG 12 hr tablet Take 1 tablet (500 mg total) by mouth 2 (two) times daily. Courtesy fill/ pt to get established with a primary MD for further refills 60 tablet 0   tiZANidine (ZANAFLEX) 4 MG tablet Take 1 tablet (4 mg total) by mouth at bedtime. Courtesy fill/ pt to get established with a primary MD for further refills 30 tablet 0   traZODone (DESYREL) 50 MG tablet Take 1 tablet (50 mg total) by mouth at bedtime. 30 tablet 0   Vitamin D, Ergocalciferol,  (DRISDOL) 1.25 MG (  50000 UNIT) CAPS capsule Take 1 capsule (50,000 Units total) by mouth every 7 (seven) days for 8 doses. 8 capsule 0   warfarin (COUMADIN) 5 MG tablet Take 1 tablet (5 mg total) by mouth every evening. Start 12/22 30 tablet 0   No current facility-administered medications on file prior to visit.    Allergies:   Allergies  Allergen Reactions   Wellbutrin [Bupropion] Hives   Cefepime Rash    Tolerates penicilllin   Ciprofloxacin Hcl Hives and Rash    Hives/rash at injection site; 01/15/22 tolerated IV cipro   Tape Rash    Physical Exam General: Pleasant middle-age Caucasian lady, seated, in no evident distress Head: head normocephalic and atraumatic.   Neck: supple with no carotid or supraclavicular bruits Cardiovascular: regular rate and rhythm, no murmurs Musculoskeletal: no deformity Skin:  no rash/petichiae Vascular:  Normal pulses all extremities  Neurologic Exam Mental Status: Awake and fully alert. Oriented to place and time. Recent and remote memory intact. Attention span, concentration and fund of knowledge appropriate. Mood and affect appropriate.  Cranial Nerves: Fundoscopic exam reveals sharp disc margins. Pupils equal, briskly reactive to light. Extraocular movements full without nystagmus. Visual fields show dense left homonymous hemianopsia to confrontation. Hearing intact. Facial sensation intact.  Moderate left lower facial weakness., tongue, palate moves normally and symmetrically.  Motor: Left hemiparesis with 3/5 strength proximally in the left upper extremity with weakness of left grip intrinsic hand muscles wrist extension.  She is able to grip bring her fingers.  Left lower extremity 4+/5 strength proximally and mild left ankle dorsiflexor weakness with foot drop.  Tone is increased on the left mild spasticity.  Normal strength on the right. Sensory.: intact to touch , pinprick , position and vibratory sensation.  Coordination: Rapid alternating  movements normal in all extremities. Finger-to-nose and heel-to-shin performed accurately bilaterally. Gait and Station: Deferred as she is in the wheelchair and did not bring her walker. Reflexes: 2+ and asymmetric and brisker on the left. Toes downgoing.   NIHSS  7 Modified [place]  3   ASSESSMENT: 41 year old patient with patchy right MCA infarct September 2023 secondary to right M1 occlusion s/p mechanical thrombectomy with TICI3 revascularization complicated by right ICA dissection requiring rescue telescoping stent placement.  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  Patient has residual significant left hemiparesis.  She was on long-term anticoagulation prior to the stroke for presumed prior history of peripheral arterial embolism and stroke but without definite evidence of hypercoagulability on lab testing or any finding of paroxysmal A-fib     PLAN: I had a long d/w patient and her daughter and son-in-law about her recent stroke, carotid stent, left hemiparesis, risk for recurrent stroke/TIAs, personally independently reviewed imaging studies and stroke evaluation results and answered questions..Since patient is having trouble affording Brilinta I recommend we switch back to Plavix instead which she should be able to afford.  She is currently on warfarin but I am not clear of as to whether she needs it as there is no clearly documented hypercoagulable disorder or atrial fibrillation.  I will discuss this with her hematologist to see if she can discontinue warfarin.  Maintain strict control of hypertension with blood pressure goal below 130/90, diabetes with hemoglobin A1c goal below 6.5% and lipids with LDL cholesterol goal below 70 mg/dL. I also advised the patient to eat a healthy diet with plenty of whole grains, cereals, fruits and vegetables, exercise regularly and maintain ideal body weight .  Consider referral to physical therapy  for vagal nerve stimulation to improve left hand range of  motion.  Followup in the future with me in 3 months or call earlier if necessary.  Greater than 50% time during this 45-minute consultation visit was spent on counseling and coordination of care about cryptogenic stroke, left hemiparesis and carotid stent.and answering questions.  Antony Contras, MD Note: This document was prepared with digital dictation and possible smart phrase technology. Any transcriptional errors that result from this process are unintentional.

## 2022-04-29 NOTE — ED Notes (Signed)
2nd call at 2311 no response

## 2022-04-29 NOTE — ED Notes (Signed)
Called once just now no response

## 2022-04-29 NOTE — ED Triage Notes (Deleted)
Pt arrived from home via Pov w c/o abscess on left side of neck. Pt had fever at home of 100.5 now 98.2 after ibu at home. Pt went to urgent care on 26th and was told to come ER if swelling did not go down with abx and steroids. It has remained the same.

## 2022-04-29 NOTE — Patient Instructions (Signed)
I had a long d/w patient and her daughter and son-in-law about her recent stroke, carotid stent, left hemiparesis, risk for recurrent stroke/TIAs, personally independently reviewed imaging studies and stroke evaluation results and answered questions..Since patient is having trouble affording Brilinta I recommend we switch back to Plavix instead which she should be able to afford.  She is currently on warfarin but I am not clear of as to whether she needs it as there is no clearly documented hypercoagulable disorder or atrial fibrillation.  I will discuss this with her hematologist to see if she can discontinue warfarin.  Maintain strict control of hypertension with blood pressure goal below 130/90, diabetes with hemoglobin A1c goal below 6.5% and lipids with LDL cholesterol goal below 70 mg/dL. I also advised the patient to eat a healthy diet with plenty of whole grains, cereals, fruits and vegetables, exercise regularly and maintain ideal body weight .  Consider referral to physical therapy for vagal nerve stimulation to improve left hand range of motion.  Followup in the future with me in 3 months or call earlier if necessary.  Stroke Prevention Some medical conditions and behaviors can lead to a higher chance of having a stroke. You can help prevent a stroke by eating healthy, exercising, not smoking, and managing any medical conditions you have. Stroke is a leading cause of functional impairment. Primary prevention is particularly important because a majority of strokes are first-time events. Stroke changes the lives of not only those who experience a stroke but also their family and other caregivers. How can this condition affect me? A stroke is a medical emergency and should be treated right away. A stroke can lead to brain damage and can sometimes be life-threatening. If a person gets medical treatment right away, there is a better chance of surviving and recovering from a stroke. What can increase my  risk? The following medical conditions may increase your risk of a stroke: Cardiovascular disease. High blood pressure (hypertension). Diabetes. High cholesterol. Sickle cell disease. Blood clotting disorders (hypercoagulable state). Obesity. Sleep disorders (obstructive sleep apnea). Other risk factors include: Being older than age 33. Having a history of blood clots, stroke, or mini-stroke (transient ischemic attack, TIA). Genetic factors, such as race, ethnicity, or a family history of stroke. Smoking cigarettes or using other tobacco products. Taking birth control pills, especially if you also use tobacco. Heavy use of alcohol or drugs, especially cocaine and methamphetamine. Physical inactivity. What actions can I take to prevent this? Manage your health conditions High cholesterol levels. Eating a healthy diet is important for preventing high cholesterol. If cholesterol cannot be managed through diet alone, you may need to take medicines. Take any prescribed medicines to control your cholesterol as told by your health care provider. Hypertension. To reduce your risk of stroke, try to keep your blood pressure below 130/80. Eating a healthy diet and exercising regularly are important for controlling blood pressure. If these steps are not enough to manage your blood pressure, you may need to take medicines. Take any prescribed medicines to control hypertension as told by your health care provider. Ask your health care provider if you should monitor your blood pressure at home. Have your blood pressure checked every year, even if your blood pressure is normal. Blood pressure increases with age and some medical conditions. Diabetes. Eating a healthy diet and exercising regularly are important parts of managing your blood sugar (glucose). If your blood sugar cannot be managed through diet and exercise, you may need to take  medicines. Take any prescribed medicines to control your  diabetes as told by your health care provider. Get evaluated for obstructive sleep apnea. Talk to your health care provider about getting a sleep evaluation if you snore a lot or have excessive sleepiness. Make sure that any other medical conditions you have, such as atrial fibrillation or atherosclerosis, are managed. Nutrition Follow instructions from your health care provider about what to eat or drink to help manage your health condition. These instructions may include: Reducing your daily calorie intake. Limiting how much salt (sodium) you use to 1,500 milligrams (mg) each day. Using only healthy fats for cooking, such as olive oil, canola oil, or sunflower oil. Eating healthy foods. You can do this by: Choosing foods that are high in fiber, such as whole grains, and fresh fruits and vegetables. Eating at least 5 servings of fruits and vegetables a day. Try to fill one-half of your plate with fruits and vegetables at each meal. Choosing lean protein foods, such as lean cuts of meat, poultry without skin, fish, tofu, beans, and nuts. Eating low-fat dairy products. Avoiding foods that are high in sodium. This can help lower blood pressure. Avoiding foods that have saturated fat, trans fat, and cholesterol. This can help prevent high cholesterol. Avoiding processed and prepared foods. Counting your daily carbohydrate intake.  Lifestyle If you drink alcohol: Limit how much you have to: 0-1 drink a day for women who are not pregnant. 0-2 drinks a day for men. Know how much alcohol is in your drink. In the [place] one drink equals one 12 oz bottle of beer (357mL), one 5 oz glass of wine (175mL), or one 1 oz glass of hard liquor (22mL). Do not use any products that contain nicotine or tobacco. These products include cigarettes, chewing tobacco, and vaping devices, such as e-cigarettes. If you need help quitting, ask your health care provider. Avoid secondhand smoke. Do not use  drugs. Activity  Try to stay at a healthy weight. Get at least 30 minutes of exercise on most days, such as: Fast walking. Biking. Swimming. Medicines Take over-the-counter and prescription medicines only as told by your health care provider. Aspirin or blood thinners (antiplatelets or anticoagulants) may be recommended to reduce your risk of forming blood clots that can lead to stroke. Avoid taking birth control pills. Talk to your health care provider about the risks of taking birth control pills if: You are over 28 years old. You smoke. You get very bad headaches. You have had a blood clot. Where to find more information American Stroke Association: www.strokeassociation.org Get help right away if: You or a loved one has any symptoms of a stroke. "BE FAST" is an easy way to remember the main warning signs of a stroke: B - Balance. Signs are dizziness, sudden trouble walking, or loss of balance. E - Eyes. Signs are trouble seeing or a sudden change in vision. F - Face. Signs are sudden weakness or numbness of the face, or the face or eyelid drooping on one side. A - Arms. Signs are weakness or numbness in an arm. This happens suddenly and usually on one side of the body. S - Speech. Signs are sudden trouble speaking, slurred speech, or trouble understanding what people say. T - Time. Time to call emergency services. Write down what time symptoms started. You or a loved one has other signs of a stroke, such as: A sudden, severe headache with no known cause. Nausea or vomiting. Seizure. These symptoms may  represent a serious problem that is an emergency. Do not wait to see if the symptoms will go away. Get medical help right away. Call your local emergency services (911 in the [place] Do not drive yourself to the hospital. Summary You can help to prevent a stroke by eating healthy, exercising, not smoking, limiting alcohol intake, and managing any medical conditions you may have. Do  not use any products that contain nicotine or tobacco. These include cigarettes, chewing tobacco, and vaping devices, such as e-cigarettes. If you need help quitting, ask your health care provider. Remember "BE FAST" for warning signs of a stroke. Get help right away if you or a loved one has any of these signs. This information is not intended to replace advice given to you by your health care provider. Make sure you discuss any questions you have with your health care provider. Document Revised: 11/19/2019 Document Reviewed: 11/19/2019 Elsevier Patient Education  [employer].

## 2022-04-30 NOTE — Telephone Encounter (Signed)
Neuro changing brillinta to plavix is fine from cardaic standpoint  Zandra Abts MD

## 2022-04-30 NOTE — Telephone Encounter (Signed)
Spoke to daughter who stated that pt saw Neuro who changed Brilinta to Plavix as it was more affordable for pt/family.   Will route to provider as FYI and update medication list.

## 2022-05-04 ENCOUNTER — Ambulatory Visit (INDEPENDENT_AMBULATORY_CARE_PROVIDER_SITE_OTHER): Payer: BC Managed Care – PPO | Admitting: Podiatry

## 2022-05-04 DIAGNOSIS — B351 Tinea unguium: Secondary | ICD-10-CM | POA: Diagnosis not present

## 2022-05-04 DIAGNOSIS — L97425 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 without evidence of necrosis: Secondary | ICD-10-CM

## 2022-05-04 DIAGNOSIS — E10621 Type 1 diabetes mellitus with foot ulcer: Secondary | ICD-10-CM

## 2022-05-04 DIAGNOSIS — M79674 Pain in right toe(s): Secondary | ICD-10-CM | POA: Diagnosis not present

## 2022-05-04 DIAGNOSIS — M79675 Pain in left toe(s): Secondary | ICD-10-CM

## 2022-05-04 NOTE — Progress Notes (Signed)
  Subjective:  Patient ID: Lauren [name], female    DOB: Dec 17, 1980,  MRN: [id number]  Chief Complaint  Patient presents with   Diabetic Ulcer    3 week Follow up left foot    42 y.o. female presents with the above complaint. History confirmed with patient.  She is doing well they have been changing with Iodosorb daily.  Her nails are thickened elongated causing pain  Objective:  Physical Exam: warm, good capillary refill and weakly palpable pulses, minimal sensation, full thickness wound measuring 0.1 x 0.2 x 0.2 cm with exposed subcutaneous tissue no s/o infection.  She has new ulceration measuring 1.0 x 0.8 x 0.3 cm on the fifth metatarsal base with surrounding erythema no drainage no exposed bone tendon or joint.  Thickened elongated yellow discolored nail with subungual debris x 9         Assessment:   1. 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 Pain due to onychomycosis of toenails of both feet      Plan:  Patient was evaluated and treated and all questions answered.   Ulcer left foot -We discussed the etiology and factors that are a part of the wound healing process.  We also discussed the risk of infection both soft tissue and osteomyelitis from open ulceration.  Discussed the risk of limb loss if this happens or worsens. -Debridement as below. -Dressed with iodosorb, DSD. -Continue home dressing changes daily with 4 x 4 gauze and iodosorb -Continue off-loading with surgical shoe. -Wound at the amputation site is now nearly fully healed.  Unfortunately the fifth metatarsal base wound is progressing.  This was debrided today as noted below.  Continue Iodosorb ointment here  Procedure: Excisional Debridement of Wound Rationale: Removal of non-viable soft tissue from the wound to promote healing.  Anesthesia: none Post-Debridement Wound Measurements: Noted above Type of  Debridement: Sharp Excisional Tissue Removed: Non-viable soft tissue Depth of Debridement: subcutaneous tissue. Technique: Sharp excisional debridement to bleeding, viable wound base.  Dressing: Dry, sterile, compression dressing. Disposition: Patient tolerated procedure well.    Discussed the etiology and treatment options for the condition in detail with the patient. Educated patient on the topical and oral treatment options for mycotic nails. Recommended debridement of the nails today. Sharp and mechanical debridement performed of all painful and mycotic nails today. Nails debrided in length and thickness using a nail nipper to level of comfort. Discussed treatment options including appropriate shoe gear. Follow up as needed for painful nails.        No follow-ups on file.

## 2022-05-05 ENCOUNTER — Other Ambulatory Visit: Payer: Self-pay | Admitting: Hematology and Oncology

## 2022-05-05 ENCOUNTER — Ambulatory Visit: Payer: BC Managed Care – PPO | Admitting: Internal Medicine

## 2022-05-05 NOTE — Progress Notes (Signed)
I tried calling Ms Vandervort, she didn't answer and VM box is full. Given concern about the need for anticoagulation by Dr Leonie Man, I think she may benefit from second opinion to discuss about anticoagulation. I will try calling her again.

## 2022-05-06 ENCOUNTER — Other Ambulatory Visit: Payer: Self-pay | Admitting: *Deleted

## 2022-05-06 ENCOUNTER — Encounter: Payer: Self-pay | Admitting: Podiatry

## 2022-05-06 DIAGNOSIS — I639 Cerebral infarction, unspecified: Secondary | ICD-10-CM

## 2022-05-06 MED ORDER — TRAMADOL HCL 50 MG PO TABS: 50.0000 mg | ORAL_TABLET | Freq: Four times a day (QID) | ORAL | 0 refills | Status: AC | PRN

## 2022-05-06 NOTE — H&P (Addendum)
Per Dr.Iruku, faxed pt demographics and notes to [facility] for referral for Recurrent arterial thrombus, crypogenic stroke. Fax receipt confirmed delivery. Fax [phone]

## 2022-05-07 ENCOUNTER — Ambulatory Visit: Payer: Self-pay | Admitting: *Deleted

## 2022-05-07 ENCOUNTER — Other Ambulatory Visit: Payer: Self-pay

## 2022-05-07 NOTE — Patient Outreach (Signed)
No Telephone outreach to patient to obtain mRS. Was successfully completed by Dr. Leonie Man on 04/29/22. MRS= [place] Care Management Assistant [phone]

## 2022-05-07 NOTE — Patient Outreach (Signed)
  Care Coordination   05/07/2022  Name: Lauren [name] MRN: [id number] DOB: 05-Oct-1980   Care Coordination Outreach Attempts:  An unsuccessful telephone outreach was attempted today to offer the patient information about available care coordination services as a benefit of their health plan. HIPAA compliant message left on voicemail, providing contact information for CSW, encouraging patient to return CSW's call at her earliest convenience.  Follow Up Plan:  Additional outreach attempts will be made to offer the patient care coordination information and services.   Encounter Outcome:  No Answer.   Care Coordination Interventions:  No, not indicated.    Nat Christen, BSW, MSW, LCSW  Licensed Education officer, [facility]  Mailing [place] [address] [place], [place] [address] Physical Address-[address], [place], [place] [address] Toll Free Main # [phone] Fax # [phone] Cell # [phone] [email or web address]

## 2022-05-08 ENCOUNTER — Encounter (HOSPITAL_COMMUNITY): Payer: Self-pay

## 2022-05-08 ENCOUNTER — Emergency Department (HOSPITAL_COMMUNITY)
Admission: EM | Admit: 2022-05-08 | Discharge: 2022-05-09 | Discharge status: Against Medical Advice | Payer: BC Managed Care – PPO | Attending: Emergency Medicine | Admitting: Emergency Medicine

## 2022-05-08 ENCOUNTER — Other Ambulatory Visit: Payer: Self-pay

## 2022-05-08 DIAGNOSIS — I2510 Atherosclerotic heart disease of native coronary artery without angina pectoris: Secondary | ICD-10-CM | POA: Insufficient documentation

## 2022-05-08 DIAGNOSIS — I509 Heart failure, unspecified: Secondary | ICD-10-CM | POA: Insufficient documentation

## 2022-05-08 DIAGNOSIS — E1140 Type 2 diabetes mellitus with diabetic neuropathy, unspecified: Secondary | ICD-10-CM | POA: Diagnosis not present

## 2022-05-08 DIAGNOSIS — Z7902 Long term (current) use of antithrombotics/antiplatelets: Secondary | ICD-10-CM | POA: Diagnosis not present

## 2022-05-08 DIAGNOSIS — Z794 Long term (current) use of insulin: Secondary | ICD-10-CM | POA: Insufficient documentation

## 2022-05-08 DIAGNOSIS — E1122 Type 2 diabetes mellitus with diabetic chronic kidney disease: Secondary | ICD-10-CM | POA: Diagnosis not present

## 2022-05-08 DIAGNOSIS — Z7901 Long term (current) use of anticoagulants: Secondary | ICD-10-CM | POA: Insufficient documentation

## 2022-05-08 DIAGNOSIS — Z5329 Procedure and treatment not carried out because of patient's decision for other reasons: Secondary | ICD-10-CM | POA: Insufficient documentation

## 2022-05-08 DIAGNOSIS — R202 Paresthesia of skin: Secondary | ICD-10-CM | POA: Diagnosis not present

## 2022-05-08 DIAGNOSIS — N184 Chronic kidney disease, stage 4 (severe): Secondary | ICD-10-CM | POA: Insufficient documentation

## 2022-05-08 DIAGNOSIS — R200 Anesthesia of skin: Secondary | ICD-10-CM | POA: Diagnosis present

## 2022-05-08 LAB — COMPREHENSIVE METABOLIC PANEL
ALT: 41 U/L (ref 0–44)
AST: 31 U/L (ref 15–41)
Albumin: 2.8 g/dL — ABNORMAL LOW (ref 3.5–5.0)
Alkaline Phosphatase: 107 U/L (ref 38–126)
Anion gap: 7 (ref 5–15)
BUN: 29 mg/dL — ABNORMAL HIGH (ref 6–20)
CO2: 25 mmol/L (ref 22–32)
Calcium: 8.1 mg/dL — ABNORMAL LOW (ref 8.9–10.3)
Chloride: 103 mmol/L (ref 98–111)
Creatinine, Ser: 2.27 mg/dL — ABNORMAL HIGH (ref 0.44–1.00)
GFR, Estimated: 27 mL/min — ABNORMAL LOW (ref >60)
Glucose, Bld: 211 mg/dL — ABNORMAL HIGH (ref 70–99)
Potassium: 4.3 mmol/L (ref 3.5–5.1)
Sodium: 135 mmol/L (ref 135–145)
Total Bilirubin: 0.3 mg/dL (ref 0.3–1.2)
Total Protein: 6.8 g/dL (ref 6.5–8.1)

## 2022-05-08 LAB — CBC WITH DIFFERENTIAL/PLATELET
Abs Immature Granulocytes: 0.02 10*3/uL (ref 0.00–0.07)
Basophils Absolute: 0 10*3/uL (ref 0.0–0.1)
Basophils Relative: 0 %
Eosinophils Absolute: 0.2 10*3/uL (ref 0.0–0.5)
Eosinophils Relative: 2 %
HCT: 38.1 % (ref 36.0–46.0)
Hemoglobin: 12.1 g/dL (ref 12.0–15.0)
Immature Granulocytes: 0 %
Lymphocytes Relative: 24 %
Lymphs Abs: 2.3 10*3/uL (ref 0.7–4.0)
MCH: 28.3 pg (ref 26.0–34.0)
MCHC: 31.8 g/dL (ref 30.0–36.0)
MCV: 89 fL (ref 80.0–100.0)
Monocytes Absolute: 0.6 10*3/uL (ref 0.1–1.0)
Monocytes Relative: 7 %
Neutro Abs: 6.2 10*3/uL (ref 1.7–7.7)
Neutrophils Relative %: 67 %
Platelets: 377 10*3/uL (ref 150–400)
RBC: 4.28 MIL/uL (ref 3.87–5.11)
RDW: 15.3 % (ref 11.5–15.5)
WBC: 9.3 10*3/uL (ref 4.0–10.5)
nRBC: 0 % (ref 0.0–0.2)

## 2022-05-08 LAB — PROTIME-INR
INR: 2.2 — ABNORMAL HIGH (ref 0.8–1.2)
Prothrombin Time: 24 seconds — ABNORMAL HIGH (ref 11.4–15.2)

## 2022-05-08 NOTE — ED Notes (Signed)
Pt ambulated to restroom with no difficulty.

## 2022-05-08 NOTE — ED Triage Notes (Signed)
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

## 2022-05-09 NOTE — ED Notes (Signed)
Edp in room  

## 2022-05-09 NOTE — ED Notes (Signed)
Doppler right wrist - HR 86

## 2022-05-09 NOTE — ED Provider Notes (Signed)
[facility] EMERGENCY DEPARTMENT Provider Note   CSN: [id number] Arrival date & time: 05/08/22  1841     History  Chief Complaint  Patient presents with   Numbness    Lauren [name] is a 42 y.o. female.  The history is provided by the patient and a relative.  Patient with multiple medical conditions including diabetes, previous stroke with left-sided weakness, chronic kidney disease, CAD presents with right arm numbness.  She reports several hours ago she had onset of numbness from the right elbow to the right hand.  No discoloration.  No weakness.  No new chest pain.  She has had a mild dull headache for several days.  She vomited about 3 days ago but none today.  She is on anticoagulation but still concerned about blood clots.    Past Medical History:  Diagnosis Date   Anemia    CAD (coronary artery disease)    a. s/p cath in 03/2014 showing 30% mid-LAD, moderate to severe disease along small D1, patent LCx, moderate to severe distal OM2 stenosis and moderate diffuse diease along RCA not amenable to PCI   CHF (congestive heart failure) ([place])    a. EF 55-60% in 12/2019 b. EF at 35-40% by echo in 05/2020   CKD (chronic kidney disease) stage 4, GFR 15-29 ml/min (HCC)    Diabetes mellitus without complication ([place])    Myocardial infarction ([place])    Neuropathy    Stroke ([place])     Home Medications Prior to Admission medications   Medication Sig Start Date End Date Taking? Authorizing Provider  acetaminophen (TYLENOL) 325 MG tablet Take 1-2 tablets (325-650 mg total) by mouth every 4 (four) hours as needed for mild pain. 02/18/22   Love, Ivan Anchors, PA-C  amoxicillin-clavulanate (AUGMENTIN) 875-125 MG tablet Take 1 tablet by mouth 2 (two) times daily. 04/14/22   McDonald, Stephan Minister, DPM  atorvastatin (LIPITOR) 40 MG tablet Take 1 tablet (40 mg total) by mouth daily. Courtesy fill/ pt to get established with a primary MD for further refills 04/19/22   Johnette Abraham, MD  clopidogrel  (PLAVIX) 75 MG tablet Take 1 tablet (75 mg total) by mouth daily. 04/29/22   Garvin Fila, MD  Continuous Blood Gluc Sensor (DEXCOM G6 SENSOR) MISC Change sensor every 10 days as directed 09/29/21   Brita Romp, NP  diclofenac Sodium (VOLTAREN ARTHRITIS PAIN) 1 % GEL Apply 2 g topically 4 (four) times daily. 04/19/22   Jennye Boroughs, MD  enoxaparin (LOVENOX) 120 MG/0.8ML injection Inject 0.8 mLs (120 mg total) into the skin daily. 03/05/22   Arnoldo Lenis, MD  furosemide (LASIX) 40 MG tablet Take 40 mg daily as needed for swelling 03/05/22   Arnoldo Lenis, MD  insulin aspart (NOVOLOG) 100 UNIT/ML injection Use with Omnipod for daily dose around 60 units daily 04/13/22   Brita Romp, NP  Insulin Disposable Pump (OMNIPOD 5 G6 POD, GEN 5,) MISC Change pod every 48-72 hours 03/17/22   Brita Romp, NP  isosorbide mononitrate (ISMO) 20 MG tablet Take 1 tablet (20 mg total) by mouth 2 (two) times daily. Courtesy fill/ pt to get established with a primary MD for further refills 04/19/22 05/19/22  Johnette Abraham, MD  levothyroxine (SYNTHROID) 125 MCG tablet Take 1 tablet (125 mcg total) by mouth daily at 6 (six) AM. Courtesy fill/ pt to get established with a primary MD for further refills 04/19/22   Johnette Abraham, MD  magnesium oxide (MAG-OX)  400 MG tablet Take 0.5 tablets (200 mg total) by mouth at bedtime. 03/12/22   Benay Pike, MD  methocarbamol (ROBAXIN) 500 MG tablet Take 1 tablet (500 mg total) by mouth every 6 (six) hours as needed for muscle spasms. 04/05/22   Johnette Abraham, MD  metoprolol tartrate (LOPRESSOR) 25 MG tablet Take 1.5 tablets (37.5 mg total) by mouth 2 (two) times daily. 03/19/22   Arnoldo Lenis, MD  Multiple Vitamin (MULTIVITAMIN WITH MINERALS) TABS tablet Take 1 tablet by mouth daily. 02/18/22   Love, Ivan Anchors, PA-C  nicotine (NICODERM CQ - DOSED IN MG/24 HOURS) 21 mg/24hr patch Place 1 patch (21 mg total) onto the skin daily. 02/18/22    Love, Ivan Anchors, PA-C  nitroGLYCERIN (NITROSTAT) 0.4 MG SL tablet Place 1 tablet (0.4 mg total) under the tongue every 5 (five) minutes x 3 doses as needed for chest pain. 02/18/22   Love, Ivan Anchors, PA-C  ondansetron (ZOFRAN) 4 MG tablet Take 1 tablet (4 mg total) by mouth every 8 (eight) hours as needed for nausea or vomiting. 03/01/22   Lovorn, Jinny Blossom, MD  pantoprazole (PROTONIX) 40 MG tablet Take 1 tablet (40 mg total) by mouth daily. Courtesy fill/ pt to get established with a primary MD for further refills 04/19/22   Johnette Abraham, MD  polyethylene glycol (MIRALAX / GLYCOLAX) 17 g packet Take 17 g by mouth 2 (two) times daily. 03/03/22   Bayard Hugger, NP  ramelteon (ROZEREM) 8 MG tablet Take 1 tablet (8 mg total) by mouth at bedtime. 02/19/22   Love, Ivan Anchors, PA-C  ranolazine (RANEXA) 500 MG 12 hr tablet Take 1 tablet (500 mg total) by mouth 2 (two) times daily. Courtesy fill/ pt to get established with a primary MD for further refills 04/19/22   Johnette Abraham, MD  tiZANidine (ZANAFLEX) 4 MG tablet Take 1 tablet (4 mg total) by mouth at bedtime. Courtesy fill/ pt to get established with a primary MD for further refills 04/19/22   Johnette Abraham, MD  traMADol (ULTRAM) 50 MG tablet Take 1 tablet (50 mg total) by mouth every 6 (six) hours as needed for up to 5 days. 05/06/22 05/11/22  McDonald, Stephan Minister, DPM  traZODone (DESYREL) 50 MG tablet Take 1 tablet (50 mg total) by mouth at bedtime. 02/18/22   Love, Ivan Anchors, PA-C  Vitamin D, Ergocalciferol, (DRISDOL) 1.25 MG (50000 UNIT) CAPS capsule Take 1 capsule (50,000 Units total) by mouth every 7 (seven) days for 8 doses. 04/21/22 06/10/22  Johnette Abraham, MD  warfarin (COUMADIN) 5 MG tablet Take 1 tablet (5 mg total) by mouth every evening. Start 12/22 04/21/22   Arnoldo Lenis, MD      Allergies    Wellbutrin [bupropion], Cefepime, Ciprofloxacin hcl, and Tape    Review of Systems   Review of Systems  Physical Exam Updated Vital  Signs BP (!) 153/95   Pulse 87   Temp 97.9 F (36.6 C) (Oral)   Resp 18   Ht 1.651 m (5\' 5" )   Wt 88.5 kg   LMP 04/28/2022   SpO2 99%   BMI 32.45 kg/m  Physical Exam CONSTITUTIONAL: Chronically ill-appearing, appears older than stated age HEAD: Normocephalic/atraumatic EYES: EOMI/PERRL ENMT: Mucous membranes moist NECK: supple no meningeal signs SPINE/BACK:entire spine nontender CV: S1/S2 noted, no murmurs/rubs/gallops noted LUNGS: Lungs are clear to auscultation bilaterally, no apparent distress ABDOMEN: soft, nontender NEURO: Pt is awake/alert/appropriate Left arm limited due to chronic weakness.  She  can move both legs without difficulty Right arm has appropriate grip strength.  She has full flexion extension of the right elbow with good strength.  No significant sensory deficit noted to the right upper extremity. EXTREMITIES: No deformities, no discoloration, no bruising to the right upper extremity.  Right brachial pulses palpated.  Distal pulses found by Doppler per nursing. SKIN: warm, color normal, no discoloration to the right arm PSYCH: no abnormalities of mood noted, alert and oriented to situation  ED Results / Procedures / Treatments   Labs (all labs ordered are listed, but only abnormal results are displayed) Labs Reviewed  COMPREHENSIVE METABOLIC PANEL - Abnormal; Notable for the following components:      Result Value   Glucose, Bld 211 (*)    BUN 29 (*)    Creatinine, Ser 2.27 (*)    Calcium 8.1 (*)    Albumin 2.8 (*)    GFR, Estimated 27 (*)    All other components within normal limits  PROTIME-INR - Abnormal; Notable for the following components:   Prothrombin Time 24.0 (*)    INR 2.2 (*)    All other components within normal limits  CBC WITH DIFFERENTIAL/PLATELET    EKG None  Radiology No results found.  Procedures Procedures    Medications Ordered in ED Medications - No data to display  ED Course/ Medical Decision Making/  A&P Clinical Course as of 05/09/22 0128  Nancy Fetter May 09, 2022  0030 Creatinine(!): 2.27 Chronic renal failure [DW]    Clinical Course User Index [DW] Ripley Fraise, MD                           Medical Decision Making Amount and/or Complexity of Data Reviewed Labs:  Decision-making details documented in ED Course.   Patient with complicated medical history including chronic kidney disease, previous CVA, on anticoagulation Daughter at bedside was worried this could also be a stroke. No obvious signs of CVA on my initial exam.  She did have pulses by Doppler right upper extremity.  She had appropriate strength in the right arm.  I advised them  to wait in the room while I looked through her previous records to gather more information.  When I went back to check on them they had already left the department and did not want to wait any longer.  Patient left against advice        Final Clinical Impression(s) / ED Diagnoses Final diagnoses:  Paresthesia    Rx / DC Orders ED Discharge Orders     None         Ripley Fraise, MD 05/09/22 0131

## 2022-05-10 ENCOUNTER — Encounter: Payer: Self-pay | Admitting: Cardiology

## 2022-05-10 ENCOUNTER — Ambulatory Visit: Payer: BC Managed Care – PPO | Attending: Cardiology | Admitting: Cardiology

## 2022-05-10 ENCOUNTER — Telehealth: Payer: Self-pay | Admitting: *Deleted

## 2022-05-10 VITALS — BP 130/85 | HR 83 | Ht 65.0 in | Wt 209.4 lb

## 2022-05-10 DIAGNOSIS — I5022 Chronic systolic (congestive) heart failure: Secondary | ICD-10-CM

## 2022-05-10 DIAGNOSIS — I2510 Atherosclerotic heart disease of native coronary artery without angina pectoris: Secondary | ICD-10-CM

## 2022-05-10 MED ORDER — HYDRALAZINE HCL 25 MG PO TABS: 12.5000 mg | ORAL_TABLET | Freq: Two times a day (BID) | ORAL | 6 refills | Status: DC

## 2022-05-10 MED ORDER — METOPROLOL SUCCINATE ER 50 MG PO TB24: 50.0000 mg | ORAL_TABLET | Freq: Every day | ORAL | 6 refills | Status: DC

## 2022-05-10 NOTE — Progress Notes (Signed)
Clinical Summary Lauren [name] is a 42 y.o.female seen today for follow up of the following medical problems.    1. Chronic systolic HF Jan 2725 echo: LVEF 35-40%, grade III dd, apex akinetic - imdur lowered to 15mg  daily due to headaches  - side effects of dizziness on higher beta blocker dosing, titrate slowly - avoiding ACe/ARB/ARNI/aldactone/SGLT2i given poor renal function. Had low bp's during recent admission, hydral stopped.     - 03/2014 cath: LVEF >60%, mid LAD 30%, D1 mod to severe small vessel, distally pruned, distal 50-60% disease. LCX patent, OM2, mild disease with distal mod to severe, RCA diffuse moderate disease not ameanble to PCI - given renal function we have not repeated her cath despite drop in LVEF 02/2020 nuclear stress: large periapical defect consistent with scar, mild apical anterolateral ischemia.   Echo reviewed , LVEF 35-40% with apical akinesis. I reviewed the echo which I had read in 12/2019 and compared images side by side. 12/2019 LVEF likely an overestimate and closer to 40%, difficult assessment given the base and mid ventricle move well and main dysfunction is focused in the apex.        Jan 2022 echo: LVEF 35-40%, apex akinetic 01/2022 echo: LVEF 35-40%, grade II dd.         - uses lasix prn, fairly infrequently. No significant edema, SOB/DOE - compliant with meds - recent weight gain but had lost close to 40 lbs during her prior admission, getting back to her baseline weight which was around 210-215       Other medical issues not addressed this visit  2. HTN -complantwith meds       3. CAD/ Elevated troponin 03/2014 Records from [facility] - admitted with DKA, found to have elevated troponins - echo with normal LVEF 65-70% - during stress test had new LBBB, referred for cath - 03/2014 cath: LVEF >60%, mid LAD 30%, D1 mod to severe small vessel, distally pruned, distal 50-60% disease. LCX patent, OM2, mild disease with distal mod to severe,  RCA diffuse moderate disease not ameanble to PCI       - admitted 01/2020 with CVA, iin this setting elevated trop to 413. EKG without acute ischemic changes - echo with some apical hypokinesis - workup deferred to allow her time to recover from stroke, also thought was this could be a stress induced CM   02/2020 nuclear stress: large periapical defect consistent with scar, mild apical anterolateral ischemia.     Jan 2022 echo LVEF 35-40%, grade III dd, apex akinetic       - no recent chest pain - no SOB/DOE - no recent edema. Has not had to take lasix in several months.          3. CKD -needs to establish with [facility] who saw her in hospital     4. History of CVA - admitted 01/2020 with CVA,   - admit 01/2022 with acute ischemic right MCA and punctuated left MCA/ACS infarct with right ICA occlusion - s/p repair with TICI, 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  - -Etiology unclear likely large vessel disease, cannot rule out cardioembolic source  -Hypercoagulable work-up showed abnormal lupus anticoagulant but was also on heparin, slightly increased rheumatoid factor  -She was previously on Eliquis and now switched to Lovenox per oncology recommendation, she should be on Lovenox therapeutically.  Patient is also on Brilinta.            5. DM2 -  followed by pcp   6. Hyperlipidemia - compliant with meds   7. Upper extremity ischemia - admit 10/2021 with right brachial/radial/ulnar ischemic requriing thrombectomy - dischatged on eliquis combined with the plavix she was already on  -Transthoracic echocardiogram is negative for thrombus  -to f/u with vascular Dr Carlis Abbott in February 2024       8. 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 s/p cath in 03/2014 showing 30% mid-LAD,  moderate to severe disease along small D1, patent LCx, moderate to severe distal OM2 stenosis and moderate diffuse diease along RCA not amenable to PCI   CHF (congestive heart failure) ([place])    a. EF 55-60% in 12/2019 b. EF at 35-40% by echo in 05/2020   CKD (chronic kidney disease) stage 4, GFR 15-29 ml/min (HCC)    Diabetes mellitus without complication (HCC)    Myocardial infarction (HCC)    Neuropathy    Stroke (HCC)      Allergies  Allergen Reactions   Wellbutrin [Bupropion] Hives   Cefepime Rash    Tolerates penicilllin   Ciprofloxacin Hcl Hives and Rash    Hives/rash at injection site; 01/15/22 tolerated IV cipro   Tape Rash     Current Outpatient Medications  Medication Sig Dispense Refill   acetaminophen (TYLENOL) 325 MG tablet Take 1-2 tablets (325-650 mg total) by mouth every 4 (four) hours as needed for mild pain. 100 tablet 0   amoxicillin-clavulanate (AUGMENTIN) 875-125 MG tablet Take 1 tablet by mouth 2 (two) times daily. 28 tablet 0   atorvastatin (LIPITOR) 40 MG tablet Take 1 tablet (40 mg total) by mouth daily. Courtesy fill/ pt to get established with a primary MD for further refills 30 tablet 0   clopidogrel (PLAVIX) 75 MG tablet Take 1 tablet (75 mg total) by mouth daily. 30 tablet 11   Continuous Blood Gluc Sensor (DEXCOM G6 SENSOR) MISC Change sensor every 10 days as directed 9 each 3   diclofenac Sodium (VOLTAREN ARTHRITIS PAIN) 1 % GEL Apply 2 g topically 4 (four) times daily. 150 g 5   enoxaparin (LOVENOX) 120 MG/0.8ML injection Inject 0.8 mLs (120 mg total) into the skin daily. 24 mL 6   furosemide (LASIX) 40 MG tablet Take 40 mg daily as needed for swelling 90 tablet 3   insulin aspart (NOVOLOG) 100 UNIT/ML injection Use with Omnipod for daily dose around 60 units daily 40 mL 3   Insulin Disposable Pump (OMNIPOD 5 G6 POD, GEN 5,) MISC Change pod every 48-72 hours 6 each 3   isosorbide mononitrate (ISMO) 20 MG tablet Take 1 tablet (20 mg total) by mouth 2  (two) times daily. Courtesy fill/ pt to get established with a primary MD for further refills 60 tablet 0   levothyroxine (SYNTHROID) 125 MCG tablet Take 1 tablet (125 mcg total) by mouth daily at 6 (six) AM. Courtesy fill/ pt to get established with a primary MD for further refills 30 tablet 0   magnesium oxide (MAG-OX) 400 MG tablet Take 0.5 tablets (200 mg total) by mouth at bedtime. 15 tablet 0   methocarbamol (ROBAXIN) 500 MG tablet Take 1 tablet (500 mg total) by mouth every 6 (six) hours as needed for muscle spasms. 60 tablet 0   metoprolol  tartrate (LOPRESSOR) 25 MG tablet Take 1.5 tablets (37.5 mg total) by mouth 2 (two) times daily. 270 tablet 2   Multiple Vitamin (MULTIVITAMIN WITH MINERALS) TABS tablet Take 1 tablet by mouth daily.     nicotine (NICODERM CQ - DOSED IN MG/24 HOURS) 21 mg/24hr patch Place 1 patch (21 mg total) onto the skin daily. 28 patch 0   nitroGLYCERIN (NITROSTAT) 0.4 MG SL tablet Place 1 tablet (0.4 mg total) under the tongue every 5 (five) minutes x 3 doses as needed for chest pain. 25 tablet 3   ondansetron (ZOFRAN) 4 MG tablet Take 1 tablet (4 mg total) by mouth every 8 (eight) hours as needed for nausea or vomiting. 90 tablet 0   pantoprazole (PROTONIX) 40 MG tablet Take 1 tablet (40 mg total) by mouth daily. Courtesy fill/ pt to get established with a primary MD for further refills 30 tablet 0   polyethylene glycol (MIRALAX / GLYCOLAX) 17 g packet Take 17 g by mouth 2 (two) times daily. 60 each 0   ramelteon (ROZEREM) 8 MG tablet Take 1 tablet (8 mg total) by mouth at bedtime. 30 tablet 0   ranolazine (RANEXA) 500 MG 12 hr tablet Take 1 tablet (500 mg total) by mouth 2 (two) times daily. Courtesy fill/ pt to get established with a primary MD for further refills 60 tablet 0   tiZANidine (ZANAFLEX) 4 MG tablet Take 1 tablet (4 mg total) by mouth at bedtime. Courtesy fill/ pt to get established with a primary MD for further refills 30 tablet 0   traMADol (ULTRAM) 50  MG tablet Take 1 tablet (50 mg total) by mouth every 6 (six) hours as needed for up to 5 days. 20 tablet 0   traZODone (DESYREL) 50 MG tablet Take 1 tablet (50 mg total) by mouth at bedtime. 30 tablet 0   Vitamin D, Ergocalciferol, (DRISDOL) 1.25 MG (50000 UNIT) CAPS capsule Take 1 capsule (50,000 Units total) by mouth every 7 (seven) days for 8 doses. 8 capsule 0   warfarin (COUMADIN) 5 MG tablet Take 1 tablet (5 mg total) by mouth every evening. Start 12/22 30 tablet 0   No current facility-administered medications for this visit.     Past Surgical History:  Procedure Laterality Date   AMPUTATION Left 09/02/2021   Procedure: AMPUTATION RAY;  Surgeon: Criselda Peaches, DPM;  Location: [place];  Service: Podiatry;  Laterality: Left;  sagittal saw, 3L bag saline & Pulse   Cardiac catherization     CHOLECYSTECTOMY     I & D EXTREMITY Left 08/30/2021   Procedure: IRRIGATION AND DEBRIDEMENT WITH BONE BIOPSY;  Surgeon: [name], DPM;  Location: [facility];  Service: Podiatry;  Laterality: Left;   IR ANGIO VERTEBRAL SEL SUBCLAVIAN INNOMINATE UNI L MOD SED  01/18/2022   IR CT HEAD LTD  01/08/2022   IR INTRA CRAN STENT  01/08/2022   IR PERCUTANEOUS ART THROMBECTOMY/INFUSION INTRACRANIAL INC DIAG ANGIO  01/08/2022   IRRIGATION AND DEBRIDEMENT FOOT Left 09/04/2021   Procedure: IRRIGATION AND DEBRIDEMENT FOOT AND CLOSURE;  Surgeon: Criselda Peaches, DPM;  Location: [facility];  Service: Podiatry;  Laterality: Left;   RADIOLOGY WITH ANESTHESIA N/A 01/08/2022   Procedure: IR WITH ANESTHESIA;  Surgeon: Radiologist, Medication, MD;  Location: [facility];  Service: Radiology;  Laterality: N/A;   THROMBECTOMY BRACHIAL ARTERY Right 11/18/2021   Procedure: RIGHT BRACHIAL, RADIAL, & ULNAR ARTERY THROMBECTOMY.;  Surgeon: Marty Heck, MD;  Location: [place];  Service: Vascular;  Laterality: Right;   TUBAL LIGATION       Allergies  Allergen Reactions   Wellbutrin [Bupropion] Hives   Cefepime Rash    Tolerates penicilllin    Ciprofloxacin Hcl Hives and Rash    Hives/rash at injection site; 01/15/22 tolerated IV cipro   Tape Rash      Family History  Problem Relation Age of Onset   Thyroid disease Mother    Hypertension Mother    Hyperlipidemia Mother    CAD Mother    CVA Mother    Hyperlipidemia Father    Hypertension Father    CAD Father    Breast cancer Paternal Grandmother 66   Cancer Paternal Grandmother        lung     Social History Ms. Pion reports that she quit smoking about 18 months ago. Her smoking use included cigarettes. She smoked an average of .25 packs per day. She has been exposed to tobacco smoke. She has never used smokeless tobacco. Ms. Finkle reports no history of alcohol use.   Review of Systems CONSTITUTIONAL: No weight loss, fever, chills, weakness or fatigue.  HEENT: Eyes: No visual loss, blurred vision, double vision or yellow sclerae.No hearing loss, sneezing, congestion, runny nose or sore throat.  SKIN: No rash or itching.  CARDIOVASCULAR: per hpi RESPIRATORY: No shortness of breath, cough or sputum.  GASTROINTESTINAL: No anorexia, nausea, vomiting or diarrhea. No abdominal pain or blood.  GENITOURINARY: No burning on urination, no polyuria NEUROLOGICAL:chronic left sided weakness MUSCULOSKELETAL: No muscle, back pain, joint pain or stiffness.  LYMPHATICS: No enlarged nodes. No history of splenectomy.  PSYCHIATRIC: No history of depression or anxiety.  ENDOCRINOLOGIC: No reports of sweating, cold or heat intolerance. No polyuria or polydipsia.  Marland Kitchen   Physical Examination Today's Vitals   05/10/22 1440  BP: 130/85  Pulse: 83  SpO2: 97%  Weight: 209 lb 6.4 oz (95 kg)  Height: 5\' 5"  (1.651 m)   Body mass index is 34.85 kg/m.  Gen: resting comfortably, no acute distress HEENT: no scleral icterus, pupils equal round and reactive, no palptable cervical adenopathy,  CV: RRR, no m/r,g, no jvd Resp: Clear to auscultation bilaterally GI: abdomen is soft,  non-tender, non-distended, normal bowel sounds, no hepatosplenomegaly MSK: extremities are warm, no edema.  Skin: warm, no rash Psych: appropriate affect   Diagnostic Studies 12/2019 echo IMPRESSIONS     1. Left ventricular ejection fraction, by estimation, is 55 to 60%. The  left ventricle has normal function. The left ventricle demonstrates  regional wall motion abnormalities (see scoring diagram/findings for  description). There is mild left ventricular   hypertrophy. Left ventricular diastolic parameters were normal. There is  moderate hypokinesis of the left ventricular, apical. There is sluggish  apical flow without clear formed thrombus.   2. Right ventricular systolic function is normal. The right ventricular  size is normal. There is mildly elevated pulmonary artery systolic  pressure.   3. The mitral valve is normal in structure. Trivial mitral valve  regurgitation. No evidence of mitral stenosis.   4. The inferior vena cava is normal in size with greater than 50%  respiratory variability, suggesting right atrial pressure of 3 mmHg.   5. The aortic valve is tricuspid. Aortic valve regurgitation is not  visualized. No aortic stenosis is present.     02/2020 nuclear stress No diagnostic ST segment changes to indicate ischemia. Large, severe intensity, periapical defect extending into the anterolateral wall and inferior septum that is largely fixed and  consistent with infarct scar. There is a small region of apical anterolateral reversibility indicating a mild ischemic territory. This is a high risk study based on defect size although region is most consistent with scar and a mild region of peri-infarct ischemia. Nuclear stress EF: 48%. Large area of periapical akinesis.     Jan 2022 echo IMPRESSIONS     1. The mid to distal anteroseptal wall is akinetic. The apex is akinetic  with some portions dyskinetic, there is no signs of apical thrombus with  use of  echocontrast. The mid to distal inferoseptal wall is akinetic. Marland Kitchen  Left ventricular ejection fraction,   by estimation, is 35 to 40%. The left ventricle has moderately decreased  function. Left ventricular diastolic parameters are consistent with Grade  III diastolic dysfunction (restrictive). Elevated left atrial pressure.   2. Right ventricular systolic function is normal. The right ventricular  size is normal.   3. Left atrial size was mildly dilated.   4. The mitral valve is normal in structure. Mild mitral valve  regurgitation. No evidence of mitral stenosis.   5. The inferior vena cava is normal in size with greater than 50%  respiratory variability, suggesting right atrial pressure of 3 mmHg.   6. Limited echo to evaluate LV function and wall motion.       Assessment and Plan   1.. Chronic systolic HF - probable ICM given her echo and nuclear findings - with her renal dysfunction have not pursued cath -prior side effects on higerh beta blocker dosing. - avoid ACE/ARB/ARNI/aldactone/SGLT2i given renal function. Hydral was stopped during admission with low bp's - will change lopressor to toprol xl 50mg  daily - bp's have stabilized, add back low dose hydralazine 12.5mg  bid.  - euvolemic today without symptoms, recent weight gain not fluid related but increased nutrtition, she is nearly back to her prior weight before the stroke.             Arnoldo Lenis, M.D.

## 2022-05-10 NOTE — Telephone Encounter (Signed)
Transition Care Management Unsuccessful Follow-up Telephone Call  Date of discharge and from where:  05/09/2022 [facility]  Attempts:  1st Attempt  Reason for unsuccessful TCM follow-up call:  Voice mail full

## 2022-05-10 NOTE — Patient Instructions (Signed)
Medication Instructions:  Stop Lopressor (Metoprolol tart) Begin Toprol XL 50mg  daily  Begin Hydralazine 12.5mg  twice a day  Continue all other medications.     Labwork: none  Testing/Procedures: none  Follow-Up: 4 months   Any Other Special Instructions Will Be Listed Below (If Applicable).   If you need a refill on your cardiac medications before your next appointment, please call your pharmacy.

## 2022-05-11 ENCOUNTER — Other Ambulatory Visit: Payer: Self-pay | Admitting: Internal Medicine

## 2022-05-11 ENCOUNTER — Other Ambulatory Visit (HOSPITAL_COMMUNITY): Payer: Self-pay

## 2022-05-11 ENCOUNTER — Other Ambulatory Visit: Payer: Self-pay | Admitting: Nurse Practitioner

## 2022-05-11 MED ORDER — RANOLAZINE ER 500 MG PO TB12
500.0000 mg | ORAL_TABLET | Freq: Two times a day (BID) | ORAL | 0 refills | Status: DC
  Filled 2022-05-11 – 2022-05-12 (×2): qty 60, 30d supply, fill #0

## 2022-05-11 MED ORDER — LEVOTHYROXINE SODIUM 125 MCG PO TABS
125.0000 ug | ORAL_TABLET | Freq: Every day | ORAL | 0 refills | Status: DC
  Filled 2022-05-11 – 2022-05-12 (×2): qty 30, 30d supply, fill #0

## 2022-05-11 MED ORDER — OMNIPOD 5 DEXG7G6 PODS GEN 5 MISC
3 refills | Status: DC
  Filled 2022-05-11 – 2022-05-19 (×2): qty 10, 30d supply, fill #0
  Filled 2022-06-11: qty 10, 30d supply, fill #1

## 2022-05-11 MED ORDER — ISOSORBIDE MONONITRATE 20 MG PO TABS
20.0000 mg | ORAL_TABLET | Freq: Two times a day (BID) | ORAL | 0 refills | Status: DC
  Filled 2022-05-11 – 2022-05-12 (×2): qty 60, 30d supply, fill #0

## 2022-05-11 MED ORDER — TIZANIDINE HCL 4 MG PO TABS
4.0000 mg | ORAL_TABLET | Freq: Every day | ORAL | 0 refills | Status: DC
  Filled 2022-05-11 – 2022-05-12 (×2): qty 30, 30d supply, fill #0

## 2022-05-12 ENCOUNTER — Other Ambulatory Visit: Payer: Self-pay

## 2022-05-12 ENCOUNTER — Ambulatory Visit: Payer: BC Managed Care – PPO | Attending: Cardiology | Admitting: *Deleted

## 2022-05-12 ENCOUNTER — Other Ambulatory Visit (HOSPITAL_COMMUNITY): Payer: Self-pay

## 2022-05-12 DIAGNOSIS — I639 Cerebral infarction, unspecified: Secondary | ICD-10-CM

## 2022-05-12 DIAGNOSIS — Z5181 Encounter for therapeutic drug level monitoring: Secondary | ICD-10-CM | POA: Diagnosis not present

## 2022-05-12 LAB — POCT INR: INR: 1.9 — AB (ref 2.0–3.0)

## 2022-05-12 NOTE — Patient Instructions (Signed)
Increase warfarin to 1 tablet daily except 1 1/2 tablets on Wednesdays and Saturdays Continue Lovenox 120mg   SQ daily until INR 2.0 or >.  Will recheck in 1 wk

## 2022-05-13 ENCOUNTER — Other Ambulatory Visit: Payer: Self-pay

## 2022-05-17 ENCOUNTER — Other Ambulatory Visit (HOSPITAL_COMMUNITY): Payer: Self-pay

## 2022-05-17 ENCOUNTER — Ambulatory Visit: Payer: BC Managed Care – PPO | Attending: Cardiology | Admitting: *Deleted

## 2022-05-17 DIAGNOSIS — I639 Cerebral infarction, unspecified: Secondary | ICD-10-CM

## 2022-05-17 DIAGNOSIS — Z5181 Encounter for therapeutic drug level monitoring: Secondary | ICD-10-CM | POA: Diagnosis not present

## 2022-05-17 LAB — POCT INR: INR: 2.9 (ref 2.0–3.0)

## 2022-05-17 MED ORDER — WARFARIN SODIUM 5 MG PO TABS: ORAL_TABLET | ORAL | 5 refills | Status: DC

## 2022-05-17 NOTE — Patient Instructions (Signed)
Decrease warfarin to 1 tablet daily except 1 1/2 tablets on Wednesdays  Stop Lovenox Will recheck in 1 wk

## 2022-05-18 ENCOUNTER — Other Ambulatory Visit (HOSPITAL_COMMUNITY): Payer: Self-pay

## 2022-05-18 ENCOUNTER — Other Ambulatory Visit: Payer: Self-pay

## 2022-05-19 ENCOUNTER — Other Ambulatory Visit (HOSPITAL_COMMUNITY): Payer: Self-pay

## 2022-05-19 ENCOUNTER — Telehealth: Payer: Self-pay

## 2022-05-19 NOTE — Telephone Encounter (Signed)
Placed call to patient as we received call from [facility] [place] hem/onc stating she has the pt scheduled for consult 5/29; however, the pt verbalized to [place] she did not understand why a 2nd opinion was needed.  Pt did not answer when I tried to call her and her VM box is full.

## 2022-05-21 ENCOUNTER — Encounter (HOSPITAL_BASED_OUTPATIENT_CLINIC_OR_DEPARTMENT_OTHER): Payer: BC Managed Care – PPO | Attending: General Surgery | Admitting: General Surgery

## 2022-05-21 ENCOUNTER — Other Ambulatory Visit (HOSPITAL_COMMUNITY): Payer: Self-pay

## 2022-05-21 ENCOUNTER — Ambulatory Visit: Payer: Self-pay | Admitting: *Deleted

## 2022-05-21 ENCOUNTER — Other Ambulatory Visit: Payer: Self-pay

## 2022-05-21 DIAGNOSIS — I130 Hypertensive heart and chronic kidney disease with heart failure and stage 1 through stage 4 chronic kidney disease, or unspecified chronic kidney disease: Secondary | ICD-10-CM | POA: Diagnosis not present

## 2022-05-21 DIAGNOSIS — E1040 Type 1 diabetes mellitus with diabetic neuropathy, unspecified: Secondary | ICD-10-CM | POA: Diagnosis not present

## 2022-05-21 DIAGNOSIS — N184 Chronic kidney disease, stage 4 (severe): Secondary | ICD-10-CM | POA: Diagnosis not present

## 2022-05-21 DIAGNOSIS — L97425 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 without evidence of necrosis: Secondary | ICD-10-CM | POA: Diagnosis not present

## 2022-05-21 DIAGNOSIS — I5042 Chronic combined systolic (congestive) and diastolic (congestive) heart failure: Secondary | ICD-10-CM | POA: Diagnosis not present

## 2022-05-21 DIAGNOSIS — Z8673 Personal history of transient ischemic attack (TIA), and cerebral infarction without residual deficits: Secondary | ICD-10-CM | POA: Diagnosis not present

## 2022-05-21 DIAGNOSIS — Z9641 Presence of insulin pump (external) (internal): Secondary | ICD-10-CM | POA: Insufficient documentation

## 2022-05-21 DIAGNOSIS — L98499 Non-pressure chronic ulcer of skin of other sites with unspecified severity: Secondary | ICD-10-CM | POA: Insufficient documentation

## 2022-05-21 DIAGNOSIS — Z794 Long term (current) use of insulin: Secondary | ICD-10-CM | POA: Diagnosis not present

## 2022-05-21 DIAGNOSIS — E1022 Type 1 diabetes mellitus with diabetic chronic kidney disease: Secondary | ICD-10-CM | POA: Insufficient documentation

## 2022-05-21 DIAGNOSIS — E10621 Type 1 diabetes mellitus with foot ulcer: Secondary | ICD-10-CM | POA: Diagnosis not present

## 2022-05-21 DIAGNOSIS — E10622 Type 1 diabetes mellitus with other skin ulcer: Secondary | ICD-10-CM | POA: Insufficient documentation

## 2022-05-21 DIAGNOSIS — L98492 Non-pressure chronic ulcer of skin of other sites with fat layer exposed: Secondary | ICD-10-CM | POA: Diagnosis not present

## 2022-05-21 NOTE — Patient Outreach (Signed)
  Care Coordination   05/21/2022  Name: Lauren [name] MRN: [id number] DOB: 04/05/1981   Care Coordination Outreach Attempts:  An unsuccessful telephone outreach was attempted today to offer the patient information about available care coordination services as a benefit of their health plan. HIPAA compliant message left on voicemail, providing contact information for CSW, encouraging patient to return CSW's call at her earliest convenience.  Follow Up Plan:  Additional outreach attempts will be made to offer the patient care coordination information and services.   Encounter Outcome:  No Answer.   Care Coordination Interventions:  No, not indicated.    Nat Christen, BSW, MSW, LCSW  Licensed Education officer, [employer]  Mailing [address], [place], [place] [address] Physical Address-[address], [place], [place] [address] Toll Free Main # [phone] Fax # [phone] Cell # [phone] [email or web address]

## 2022-05-21 NOTE — Progress Notes (Signed)
Lauren [name], Lauren [name] ([id number]) [id number] Page 1 of 9 Visit Report for 05/21/2022 Chief Complaint Document Details Patient Name: Date of Service: Lauren [name], Lauren [name] 05/21/2022 9:30 A M Medical Record Number: [id number] Patient Account Number: [id number] Date of Birth/Sex: Treating RN: 07/16/1980 (42 y.o. F) Primary Care Provider: Marland Kitchen Other Clinician: Referring Provider: Treating Provider/Extender: [name] in Treatment: 0 Information Obtained from: Patient Chief Complaint Patients presents for treatment of an open diabetic ulcer on her abdomen Electronic Signature(s) Signed: 05/21/2022 10:45:38 AM By: Fredirick Maudlin MD FACS Entered By: Fredirick Maudlin on 05/21/2022 10:45:37 -------------------------------------------------------------------------------- Debridement Details Patient Name: Date of Service: Lauren [name], Lauren [name] 05/21/2022 9:30 A M Medical Record Number: [id number] Patient Account Number: [id number] Date of Birth/Sex: Treating RN: 1980-08-27 (42 y.o. Martyn Malay, Linda Primary Care Provider: Marland Kitchen Other Clinician: Referring Provider: Treating Provider/Extender: [name] in Treatment: 0 Debridement Performed for Assessment: Wound #1 Left Abdomen - Lower Quadrant Performed By: Physician Fredirick Maudlin, MD Debridement Type: Debridement Level of Consciousness (Pre-procedure): Awake and Alert Pre-procedure Verification/Time Out Yes - 10:35 Taken: Start Time: 10:37 Pain Control: Lidocaine 4% T opical Solution T Area Debrided (L x W): otal 0.8 (cm) x 1 (cm) = 0.8 (cm) Tissue and other material debrided: Non-Viable, Slough, Slough Level: Non-Viable Tissue Debridement Description: Selective/Open Wound Instrument: Curette Bleeding: Minimum Hemostasis Achieved: Pressure Procedural Pain: 0 Post Procedural Pain: 0 Response to Treatment: Procedure was tolerated well Level  of Consciousness (Post- Awake and Alert procedure): Post Debridement Measurements of Total Wound Length: (cm) 0.8 Width: (cm) 1 Depth: (cm) 0.1 Volume: (cm) 0.063 Character of Wound/Ulcer Post Debridement: Improved Post Procedure Diagnosis Same as Lauren [name], Lauren [name] ([id number]) [phone].pdf Page 2 of 9 Notes scribed by Baruch Gouty, RN for Dr. Celine Ahr Electronic Signature(s) Signed: 05/21/2022 10:59:13 AM By: Fredirick Maudlin MD FACS Signed: 05/21/2022 3:51:29 PM By: Baruch Gouty RN, BSN Entered By: Baruch Gouty on 05/21/2022 10:40:43 -------------------------------------------------------------------------------- HPI Details Patient Name: Date of Service: KAEGAN, STIGLER 05/21/2022 9:30 A M Medical Record Number: [id number] Patient Account Number: [id number] Date of Birth/Sex: Treating RN: [birth year] (42 y.o. 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 She has been on Lovenox injections and also wears cutaneous blood sugar monitor and insulin pump. Apparently about a month ago, she developed a small lump under the skin that subsequently ruptured. This is left a small ulcer in her left lower abdomen. Her primary care provider has had her applying Neosporin and Band-Aids. According to the patient and her daughter, there is really been no change or improvement since that time. Electronic Signature(s) Signed: 05/21/2022  10:53:04 AM By: Fredirick Maudlin MD FACS Entered By: Fredirick Maudlin on 05/21/2022 10:53:03 -------------------------------------------------------------------------------- Physical Exam Details Patient Name: Date of Service: Lauren [name], Lauren [name] 05/21/2022 9:30 A M Medical Record Number: [id number] Patient Account Number: [id number] Date of Birth/Sex: Treating RN: 01-02-1981 (42 y.o. F) Primary Care Provider: Marland Kitchen Other Clinician: Referring Provider: Treating Provider/Extender: [name] in Treatment: 0 Constitutional Slightly hypertensive. . . . Appears chronically ill but in no acute distress. Respiratory Normal work of breathing on  room air. Notes 05/21/2022: She has an insulin pump and glucose monitor on her abdomen. There is old bruising present. In the left lower quadrant, there is a circular ulcer with some hypertrophic granulation tissue and light slough. No erythema, induration, malodor, or purulent drainage to suggest infection. Electronic Signature(s) Signed: 05/21/2022 10:56:12 AM By: Fredirick Maudlin MD FACS Entered By: Fredirick Maudlin on 05/21/2022 10:56:12 Lauren [name] ([id number]) [id number] Page 3 of 9 -------------------------------------------------------------------------------- Physician Orders Details Patient Name: Date of Service: Lauren [name], Lauren [name] 05/21/2022 9:30 A M Medical Record Number: [id number] Patient Account Number: [id number] Date of Birth/Sex: Treating RN: Nov 01, 1980 (42 y.o. Elam Dutch Primary Care Provider: Marland Kitchen Other Clinician: Referring Provider: Treating Provider/Extender: [name] in Treatment: 0 Verbal / Phone Orders: No Diagnosis Coding ICD-10 Coding Code Description L98.499 Non-pressure chronic ulcer of skin of other sites with unspecified severity E10.622 Type 1 diabetes mellitus with other skin ulcer N18.4 Chronic kidney  disease, stage 4 (severe) I50.42 Chronic combined systolic (congestive) and diastolic (congestive) heart failure E66.9 Obesity, unspecified I63.9 Cerebral infarction, unspecified Follow-up Appointments ppointment in 1 week. - Dr. Celine Ahr RM 1 Return A Monday 1/29 @ 3:30 pm Bathing/ Shower/ Hygiene May shower and wash wound with soap and water. Wound Treatment Wound #1 - Abdomen - Lower Quadrant Wound Laterality: Left Prim Dressing: Hydrofera Blue Ready Transfer Foam, 2.5x2.5 (in/in) (DME) (Generic) 1 x Per Day/30 Days ary Discharge Instructions: Apply directly to wound bed as directed Secondary Dressing: Zetuvit Plus Silicone Border Dressing 4x4 (in/in) (DME) (Generic) 1 x Per Day/30 Days Discharge Instructions: Apply silicone border over primary dressing as directed. Patient Medications llergies: Wellbutrin, cefepime, ciprofloxacin, adhesive A Notifications Medication Indication Start End prior to debridement 05/21/2022 lidocaine DOSE topical 4 % cream - cream topical Electronic Signature(s) Signed: 05/21/2022 10:56:28 AM By: Fredirick Maudlin MD FACS Entered By: Fredirick Maudlin on 05/21/2022 10:56:28 -------------------------------------------------------------------------------- Problem List Details Patient Name: Date of Service: Lauren [name], Lauren [name] 05/21/2022 9:30 A M Medical Record Number: [id number] Patient Account Number: [id number] Date of Birth/Sex: Treating RN: 1981/02/26 (42 y.o. F) Primary Care Provider: Marland Kitchen Other Clinician: Referring Provider: Treating Provider/Extender: [name] in Treatment: 0 [place], Lauren [name] ([id number]) [id number] Page 4 of 9 Active Problems ICD-10 Encounter Code Description Active Date MDM Diagnosis L98.499 Non-pressure chronic ulcer of skin of other sites with unspecified severity 05/21/2022 No Yes E10.622 Type 1 diabetes mellitus with other skin ulcer 05/21/2022 No Yes N18.4  Chronic kidney disease, stage 4 (severe) 05/21/2022 No Yes I50.42 Chronic combined systolic (congestive) and diastolic (congestive) heart failure 05/21/2022 No Yes E66.9 Obesity, unspecified 05/21/2022 No Yes I63.9 Cerebral infarction, unspecified 05/21/2022 No Yes Inactive Problems Resolved Problems Electronic Signature(s) Signed: 05/21/2022 10:43:01 AM By: Fredirick Maudlin MD FACS Previous Signature: 05/21/2022 10:13:06 AM Version By: Fredirick Maudlin MD FACS Entered By: Fredirick Maudlin on 05/21/2022 10:43:01 -------------------------------------------------------------------------------- Progress Note Details Patient Name: Date of Service: Lauren [name], Lauren [name] 05/21/2022 9:30 A M Medical Record Number: [id number] Patient Account Number: [id number] Date of Birth/Sex: Treating RN: [birth year] (42 y.o. 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 She has been on Lovenox injections and also wears cutaneous blood sugar monitor and insulin pump. Apparently about a month ago, she developed a small lump under the skin that subsequently ruptured. This is left a small ulcer in her left lower abdomen. Her primary care provider has had her applying Neosporin and Band-Aids. According to the patient and her daughter, there is really been no change or improvement since that  time. Patient History Information obtained from Patient, Caregiver, Chart. Allergies MAVERY, MILLING ([id number]) [id number] Page 5 of 9 Wellbutrin (Reaction: hiives), cefepime (Reaction: rash), ciprofloxacin (Reaction: hives, rash), adhesive (Reaction: rash) Family History Cancer - Paternal Grandparents, Diabetes - Child,Siblings, Heart Disease - Mother,Father,Maternal Grandparents,Paternal Grandparents, Hypertension - Mother,Father, Lung Disease - Mother,Siblings, Stroke - Mother, Thyroid Problems - Mother, No family history of Hereditary Spherocytosis, Kidney Disease, Seizures, Tuberculosis. Social History Former smoker - quit 01/2022, Marital Status - Married, Alcohol Use - Never, Drug Use - No History, Caffeine Use - Rarely. Medical History Eyes Denies history of Cataracts, Glaucoma, Optic Neuritis Ear/Nose/Mouth/Throat Denies history of Chronic sinus problems/congestion, Middle ear problems Hematologic/Lymphatic Patient has history of Anemia Cardiovascular Patient has history of Congestive Heart Failure, Coronary Artery Disease, Hypertension, Myocardial Infarction Denies history of Peripheral Arterial Disease Endocrine Patient has history of Type I Diabetes Genitourinary Denies history of End Stage Renal Disease Integumentary (Skin) Denies history of History of Burn Musculoskeletal Patient has history of Osteomyelitis - left foot Neurologic Patient has history of Neuropathy Oncologic Denies history of Received Chemotherapy, Received Radiation Psychiatric Denies history of Anorexia/bulimia, Confinement Anxiety Patient is treated with Insulin. Blood sugar is tested. Hospitalization/Surgery History - thrombectomy right brachial artery. - left 5th toe ray amputation. - cholecystectomy. - tubal ligation. - cardiac cath. Medical A Surgical History Notes nd Constitutional Symptoms (General Health) vitamin D  deficiency Ear/Nose/Mouth/Throat frequent nose bleeds Respiratory hx pneumonia Cardiovascular hx thrombus right arm, right carotid artery occlusion, ischemic cardiomyopathy, hyperlipidemia Endocrine hypothyroidism Genitourinary CKD stage 4 Neurologic CVA, left hemiplegia Review of Systems (ROS) Eyes Complains or has symptoms of Glasses / Contacts. Denies complaints or symptoms of Dry Eyes, Vision Changes. Ear/Nose/Mouth/Throat Denies complaints or symptoms of Chronic sinus problems or rhinitis. Respiratory Denies complaints or symptoms of Chronic or frequent coughs, Shortness of Breath. Gastrointestinal Denies complaints or symptoms of Frequent diarrhea, Nausea, Vomiting. Endocrine Denies complaints or symptoms of Heat/cold intolerance. Genitourinary Denies complaints or symptoms of Frequent urination. Integumentary (Skin) Complains or has symptoms of Wounds - abdomen. Musculoskeletal Complains or has symptoms of Muscle Weakness. Denies complaints or symptoms of Muscle Pain. Neurologic Complains or has symptoms of Numbness/parasthesias. Psychiatric Denies complaints or symptoms of Claustrophobia. Objective Constitutional Lauren [name], Lauren [name] ([id number]) [id number] Page 6 of 9 Slightly hypertensive. Appears chronically ill but in no acute distress. Vitals Time Taken: 9:52 AM, Height: 65 in, Weight: 201 lbs, BMI: 33.4, Temperature: 98.3 F, Pulse: 85 bpm, Respiratory Rate: 18 breaths/min, Blood Pressure: 141/92 mmHg, Capillary Blood Glucose: 141 mg/dl. General Notes: glucose per pt's monitor Respiratory Normal work of breathing on room air. General Notes: 05/21/2022: She has an insulin pump and glucose monitor on her abdomen. There is old bruising present. In the left lower quadrant, there is a circular ulcer with some hypertrophic granulation tissue and light slough. No erythema, induration, malodor, or purulent drainage to suggest  infection. Integumentary (Hair, Skin) Wound #1 status is Open. Original cause of wound was Bump. The date acquired was: 04/02/2022. The  wound is located on the Left Abdomen - Lower Quadrant. The wound measures 0.8cm length x 1cm width x 0.1cm depth; 0.628cm^2 area and 0.063cm^3 volume. There is Fat Layer (Subcutaneous Tissue) exposed. There is no tunneling or undermining noted. There is a medium amount of sanguinous drainage noted. The wound margin is flat and intact. There is large (67- 100%) red, friable granulation within the wound bed. There is a small (1-33%) amount of necrotic tissue within the wound bed including Adherent Slough. The periwound skin appearance had no abnormalities noted for moisture. The periwound skin appearance had no abnormalities noted for color. The periwound skin appearance exhibited: Induration. Periwound temperature was noted as No Abnormality. The periwound has tenderness on palpation. Assessment Active Problems ICD-10 Non-pressure chronic ulcer of skin of other sites with unspecified severity Type 1 diabetes mellitus with other skin ulcer Chronic kidney disease, stage 4 (severe) Chronic combined systolic (congestive) and diastolic (congestive) heart failure Obesity, unspecified Cerebral infarction, unspecified Procedures Wound #1 Pre-procedure diagnosis of Wound #1 is an Atypical located on the Left Abdomen - Lower Quadrant . There was a Selective/Open Wound Non-Viable Tissue Debridement with a total area of 0.8 sq cm performed by Fredirick Maudlin, MD. With the following instrument(s): Curette to remove Non-Viable tissue/material. Material removed includes [facility] after achieving pain control using Lidocaine 4% Topical Solution. No specimens were taken. A time out was conducted at 10:35, prior to the start of the procedure. A Minimum amount of bleeding was controlled with Pressure. The procedure was tolerated well with a pain level of 0 throughout and a pain  level of 0 following the procedure. Post Debridement Measurements: 0.8cm length x 1cm width x 0.1cm depth; 0.063cm^3 volume. Character of Wound/Ulcer Post Debridement is improved. Post procedure Diagnosis Wound #1: Same as Pre-Procedure General Notes: scribed by Baruch Gouty, RN for Dr. Celine Ahr. Plan Follow-up Appointments: Return Appointment in 1 week. - Dr. Celine Ahr RM 1 Monday 1/29 @ 3:30 pm Bathing/ Shower/ Hygiene: May shower and wash wound with soap and water. The following medication(s) was prescribed: lidocaine topical 4 % cream cream topical for prior to debridement was prescribed at facility WOUND #1: - Abdomen - Lower Quadrant Wound Laterality: Left Prim Dressing: Hydrofera Blue Ready Transfer Foam, 2.5x2.5 (in/in) (DME) (Generic) 1 x Per Day/30 Days ary Discharge Instructions: Apply directly to wound bed as directed Secondary Dressing: Zetuvit Plus Silicone Border Dressing 4x4 (in/in) (DME) (Generic) 1 x Per Day/30 Days Discharge Instructions: Apply silicone border over primary dressing as directed. 05/21/2022: This is a 42 year old type I diabetic with an extensive and complicated past medical history. She developed an ulcer on her abdomen and what seems to have been an old injection site from Lovenox. I used a curette to debride some slough from the site and then chemically cauterized the hypertrophic granulation tissue with silver nitrate. I am going to use Hydrofera Blue ready foam and a foam border dressing. Follow-up in 1 week. Electronic Signature(s) Signed: 05/21/2022 10:57:56 AM By: Fredirick Maudlin MD FACS Entered By: Fredirick Maudlin on 05/21/2022 10:57:56 Lauren [name] ([id number]) [id number] Page 7 of 9 -------------------------------------------------------------------------------- HxROS Details Patient Name: Date of Service: Lauren [name], Lauren [name] 05/21/2022 9:30 A M Medical Record Number: [id number] Patient Account Number: [id number] Date  of Birth/Sex: Treating RN: 1981-03-23 (42 y.o. Elam Dutch Primary Care Provider: Marland Kitchen Other Clinician: Referring Provider: Treating Provider/Extender: [name] in Treatment: 0 Information Obtained From Patient Caregiver Chart Eyes Complaints and Symptoms: Positive for: Glasses / Contacts Negative for: Dry  Eyes; Vision Changes Medical History: Negative for: Cataracts; Glaucoma; Optic Neuritis Ear/Nose/Mouth/Throat Complaints and Symptoms: Negative for: Chronic sinus problems or rhinitis Medical History: Negative for: Chronic sinus problems/congestion; Middle ear problems Past Medical History Notes: frequent nose bleeds Respiratory Complaints and Symptoms: Negative for: Chronic or frequent coughs; Shortness of Breath Medical History: Past Medical History Notes: hx pneumonia Gastrointestinal Complaints and Symptoms: Negative for: Frequent diarrhea; Nausea; Vomiting Endocrine Complaints and Symptoms: Negative for: Heat/cold intolerance Medical History: Positive for: Type I Diabetes Past Medical History Notes: hypothyroidism Time with diabetes: 4 yrs Treated with: Insulin Blood sugar tested every day: Yes Tested : has dexcom Genitourinary Complaints and Symptoms: Negative for: Frequent urination Medical History: Negative for: End Stage Renal Disease Past Medical History Notes: CKD stage 4 Integumentary (Skin) Complaints and Symptoms: Positive for: Wounds - abdomen Medical History: Negative for: History of Burn Lauren [name], Lauren [name] ([id number]) [id number] Page 8 of 9 Musculoskeletal Complaints and Symptoms: Positive for: Muscle Weakness Negative for: Muscle Pain Medical History: Positive for: Osteomyelitis - left foot Neurologic Complaints and Symptoms: Positive for: Numbness/parasthesias Medical History: Positive for: Neuropathy Past Medical History Notes: CVA, left  hemiplegia Psychiatric Complaints and Symptoms: Negative for: Claustrophobia Medical History: Negative for: Anorexia/bulimia; Confinement Anxiety Constitutional Symptoms (General Health) Medical History: Past Medical History Notes: vitamin D deficiency Hematologic/Lymphatic Medical History: Positive for: Anemia Cardiovascular Medical History: Positive for: Congestive Heart Failure; Coronary Artery Disease; Hypertension; Myocardial Infarction Negative for: Peripheral Arterial Disease Past Medical History Notes: hx thrombus right arm, right carotid artery occlusion, ischemic cardiomyopathy, hyperlipidemia Oncologic Medical History: Negative for: Received Chemotherapy; Received Radiation Immunizations Pneumococcal Vaccine: Received Pneumococcal Vaccination: No Implantable Devices None Hospitalization / Surgery History Type of Hospitalization/Surgery thrombectomy right brachial artery left 5th toe ray amputation cholecystectomy tubal ligation cardiac cath Family and Social History Cancer: Yes - Paternal Grandparents; Diabetes: Yes - Child,Siblings; Heart Disease: Yes - Mother,Father,Maternal Grandparents,Paternal Grandparents; Hereditary Spherocytosis: No; Hypertension: Yes - Mother,Father; Kidney Disease: No; Lung Disease: Yes - Mother,Siblings; Seizures: No; Stroke: Yes - Mother; Thyroid Problems: Yes - Mother; Tuberculosis: No; Former smoker - quit 01/2022; Marital Status - Married; Alcohol Use: Never; Drug Use: No History; Caffeine Use: Rarely; Financial Concerns: No; Food, Clothing or Shelter Needs: No; Support System Lacking: No; Transportation Concerns: No Electronic Signature(s) Signed: 05/21/2022 10:59:13 AM By: Fredirick Maudlin MD FACS Signed: 05/21/2022 3:51:29 PM By: Baruch Gouty RN, BSN Entered By: Baruch Gouty on 05/21/2022 10:12:06 Lauren [name] ([id number]) [id number] Page 9 of  9 -------------------------------------------------------------------------------- SuperBill Details Patient Name: Date of Service: HENRIETTE, HESSER 05/21/2022 Medical Record Number: [id number] Patient Account Number: [id number] Date of Birth/Sex: Treating RN: Mar 14, 1981 (42 y.o. F) Primary Care Provider: Marland Kitchen Other Clinician: Referring Provider: Treating Provider/Extender: [name] in Treatment: 0 Diagnosis Coding ICD-10 Codes Code Description [phone] Non-pressure chronic ulcer of skin of other sites with unspecified severity E10.622 Type 1 diabetes mellitus with other skin ulcer N18.4 Chronic kidney disease, stage 4 (severe) I50.42 Chronic combined systolic (congestive) and diastolic (congestive) heart failure E66.9 Obesity, unspecified I63.9 Cerebral infarction, unspecified Facility Procedures : CPT4 Code: [id number] Description: 99213 - WOUND CARE VISIT-LEV 3 EST PT Modifier: 25 Quantity: 1 : CPT4 Code: [id number] Description: 83382 - DEBRIDE WOUND 1ST 20 SQ CM OR < ICD-10 Diagnosis Description L98.499 Non-pressure chronic ulcer of skin of other sites with unspecified severity Modifier: Quantity: 1 Physician Procedures : CPT4 Code Description Modifier [id number] 73419 - WC PHYS LEVEL 4 - NEW PT 25 ICD-10 Diagnosis Description L98.499 Non-pressure chronic ulcer of skin of  other sites with unspecified severity E10.622 Type 1 diabetes mellitus with other skin ulcer I50.42  Chronic combined systolic (congestive) and diastolic (congestive) heart failure N18.4 Chronic kidney disease, stage 4 (severe) Quantity: 1 : [id number] - WC PHYS DEBR WO ANESTH 20 SQ CM ICD-10 Diagnosis Description L98.499 Non-pressure chronic ulcer of skin of other sites with unspecified severity Quantity: 1 Electronic Signature(s) Signed: 05/21/2022 2:27:15 PM By: Fredirick Maudlin MD FACS Signed: 05/21/2022 3:51:29 PM By: Baruch Gouty RN, BSN Previous Signature:  05/21/2022 10:58:46 AM Version By: Fredirick Maudlin MD FACS Entered By: Baruch Gouty on 05/21/2022 14:01:33

## 2022-05-21 NOTE — Progress Notes (Signed)
AYSLIN, KUNDERT ([id number]) [id number] Page 1 of 8 Visit Report for 05/21/2022 Allergy List Details Patient Name: Date of Service: Lauren [name], Lauren [name] 05/21/2022 9:30 A M Medical Record Number: [id number] Patient Account Number: [id number] Date of Birth/Sex: Treating RN: [birth year] (42 y.o. Elam Dutch Primary Care Almeta Geisel: Marland Kitchen Other Clinician: Referring Taia Bramlett: Treating Tenia Goh/Extender: [name] in Treatment: 0 Allergies Active Allergies Wellbutrin Reaction: hiives cefepime Reaction: rash ciprofloxacin Reaction: hives, rash adhesive Reaction: rash Allergy Notes Electronic Signature(s) Signed: 05/21/2022 3:51:29 PM By: Baruch Gouty RN, BSN Entered By: Baruch Gouty on 05/21/2022 09:55:52 -------------------------------------------------------------------------------- Arrival Information Details Patient Name: Date of Service: Lauren [name], Lauren [name] 05/21/2022 9:30 A M Medical Record Number: [id number] Patient Account Number: [id number] Date of Birth/Sex: Treating RN: [birth year] (42 y.o. Elam Dutch Primary Care Olen Eaves: Marland Kitchen Other Clinician: Referring Cephas Revard: Treating Bernardine Langworthy/Extender: [name] in Treatment: 0 Visit Information Patient Arrived: Ambulatory Arrival Time: 09:46 Accompanied By: daughter Transfer Assistance: None Patient Identification Verified: Yes Secondary Verification Process Completed: Yes Patient Requires Transmission-Based Precautions: No Patient Has Alerts: No Electronic Signature(s) Signed: 05/21/2022 3:51:29 PM By: Baruch Gouty RN, BSN Entered By: Baruch Gouty on 05/21/2022 09:52:46 Lauren [name] ([id number]) [id number] Page 2 of 8 -------------------------------------------------------------------------------- Clinic Level of Care Assessment Details Patient Name: Date of Service: Lauren [name], Lauren [name] 05/21/2022 9:30 A M Medical Record Number: [id number] Patient Account Number: [id number] Date of Birth/Sex: Treating RN: [birth year] (42 y.o. Elam Dutch Primary Care Lani Mendiola: Marland Kitchen Other Clinician: Referring Abdulkadir Emmanuel: Treating Ulyssa Walthour/Extender: [name] in Treatment: 0 Clinic Level of Care Assessment Items TOOL 1 Quantity Score []  - 0 Use when EandM and Procedure is performed on INITIAL visit ASSESSMENTS - Nursing Assessment / Reassessment X- 1 20 General Physical Exam (combine w/ comprehensive assessment (listed just below) when performed on new pt. evals) X- 1 25 Comprehensive Assessment (HX, ROS, Risk Assessments, Wounds Hx, etc.) ASSESSMENTS - Wound and Skin Assessment / Reassessment []  - 0 Dermatologic / Skin Assessment (not related to wound area) ASSESSMENTS - Ostomy and/or Continence Assessment and Care []  - 0 Incontinence Assessment and Management []  - 0 Ostomy Care Assessment and Management (repouching, etc.) PROCESS - Coordination of Care X - Simple Patient / Family Education for ongoing care 1 15 []  - 0 Complex (extensive) Patient / Family Education for ongoing care X- 1 10 Staff obtains Programmer, systems, Records, T Results / Process Orders est []  - 0 Staff telephones HHA, Nursing Homes / Clarify orders / etc []  - 0 Routine Transfer to another Facility (non-emergent condition) []  - 0 Routine Hospital Admission (non-emergent condition) X- 1 15 New Admissions / Biomedical engineer / Ordering NPWT Apligraf, etc. , []  - 0 Emergency Hospital Admission (emergent condition) PROCESS - Special Needs []  - 0 Pediatric / Minor Patient Management []  - 0 Isolation Patient Management []  - 0 Hearing / Language / Visual special needs []  - 0 Assessment of Community assistance (transportation, D/C planning, etc.) []  - 0 Additional assistance / Altered mentation []  - 0 Support Surface(s) Assessment (bed, cushion, seat,  etc.) INTERVENTIONS - Miscellaneous []  - 0 External ear exam []  - 0 Patient Transfer (multiple staff / Civil Service fast streamer / Similar devices) []  - 0 Simple Staple / Suture removal (25 or less) []  - 0 Complex Staple / Suture removal (26 or more) []  - 0 Hypo/Hyperglycemic Management (do not check if billed separately) []  - 0 Ankle / Brachial Index (ABI) - do not check if billed separately Has the patient been  seen at the hospital within the last three years: Yes Total Score: 85 Level Of Care: New/Established - Level 3 Electronic Signature(s) Signed: 05/21/2022 3:51:29 PM By: Baruch Gouty RN, BSN Lauren [name], Lauren [name] ([id number]) PM By: Baruch Gouty RN, BSN [phone].pdf Page 3 of 8 Signed: 05/21/2022 3:51:29 Entered By: Baruch Gouty on 05/21/2022 10:57:38 -------------------------------------------------------------------------------- Encounter Discharge Information Details Patient Name: Date of Service: Lauren [name], Lauren [name] 05/21/2022 9:30 A M Medical Record Number: [id number] Patient Account Number: [id number] Date of Birth/Sex: Treating RN: January 09, 1981 (42 y.o. Elam Dutch Primary Care Aalaysia Liggins: Marland Kitchen Other Clinician: Referring Brooklynne Pereida: Treating Shaunna Rosetti/Extender: [name] in Treatment: 0 Encounter Discharge Information Items Post Procedure Vitals Discharge Condition: Stable Temperature (F): 98.3 Ambulatory Status: Wheelchair Pulse (bpm): 85 Discharge Destination: Home Respiratory Rate (breaths/min): 18 Transportation: Private Auto Blood Pressure (mmHg): 141/92 Accompanied By: daughter Schedule Follow-up Appointment: Yes Clinical Summary of Care: Patient Declined Electronic Signature(s) Signed: 05/21/2022 3:51:29 PM By: Baruch Gouty RN, BSN Entered By: Baruch Gouty on 05/21/2022 11:01:35 -------------------------------------------------------------------------------- Lower Extremity Assessment  Details Patient Name: Date of Service: Lauren [name], Lauren [name] 05/21/2022 9:30 A M Medical Record Number: [id number] Patient Account Number: [id number] Date of Birth/Sex: Treating RN: May 01, 1981 (42 y.o. Elam Dutch Primary Care Rayette Mogg: Marland Kitchen Other Clinician: Referring Bryen Hinderman: Treating Ayde Record/Extender: [name] in Treatment: 0 Electronic Signature(s) Signed: 05/21/2022 3:51:29 PM By: Baruch Gouty RN, BSN Entered By: Baruch Gouty on 05/21/2022 10:17:37 -------------------------------------------------------------------------------- Multi Wound Chart Details Patient Name: Date of Service: Lauren [name], Lauren [name] 05/21/2022 9:30 A M Medical Record Number: [id number] Patient Account Number: [id number] Date of Birth/Sex: Treating RN: [birth year] (42 y.o. F) Primary Care Vi Biddinger: Marland Kitchen Other Clinician: Referring Cleland Simkins: Treating Ashlin Kreps/Extender: [name] in Treatment: 0 Vital Signs Height(in): 65 Capillary Blood Glucose(mg/dl): 141 Weight(lbs): 201 Pulse(bpm): 85 Lauren [name], Lauren [name] ([id number]) [phone].pdf Page 4 of 8 Body Mass Index(BMI): 33.4 Blood Pressure(mmHg): 141/92 Temperature(F): 98.3 Respiratory Rate(breaths/min): 18 [1:Photos:] [N/A:N/A] Left Abdomen - Lower Quadrant N/A N/A Wound Location: Bump N/A N/A Wounding Event: Atypical N/A N/A Primary Etiology: Anemia, Congestive Heart Failure, N/A N/A Comorbid History: Coronary Artery Disease, Hypertension, Myocardial Infarction, Type I Diabetes, Osteomyelitis, Neuropathy 04/02/2022 N/A N/A Date Acquired: 0 N/A N/A Weeks of Treatment: Open N/A N/A Wound Status: No N/A N/A Wound Recurrence: 0.8x1x0.1 N/A N/A Measurements L x W x D (cm) 0.628 N/A N/A A (cm) : rea 0.063 N/A N/A Volume (cm) : Full Thickness Without Exposed N/A N/A Classification: Support Structures Medium N/A N/A Exudate A  mount: Sanguinous N/A N/A Exudate Type: red N/A N/A Exudate Color: Flat and Intact N/A N/A Wound Margin: Large (67-100%) N/A N/A Granulation A mount: Red, Friable N/A N/A Granulation Quality: Small (1-33%) N/A N/A Necrotic A mount: Fat Layer (Subcutaneous Tissue): Yes N/A N/A Exposed Structures: Fascia: No Tendon: No Muscle: No Joint: No Bone: No None N/A N/A Epithelialization: Debridement - Selective/Open Wound N/A N/A Debridement: Pre-procedure Verification/Time Out 10:35 N/A N/A Taken: Lidocaine 4% Topical Solution N/A N/A Pain Control: Slough N/A N/A Tissue Debrided: Non-Viable Tissue N/A N/A Level: 0.8 N/A N/A Debridement A (sq cm): rea Curette N/A N/A Instrument: Minimum N/A N/A Bleeding: Pressure N/A N/A Hemostasis A chieved: 0 N/A N/A Procedural Pain: 0 N/A N/A Post Procedural Pain: Procedure was tolerated well N/A N/A Debridement Treatment Response: 0.8x1x0.1 N/A N/A Post Debridement Measurements L x W x D (cm) 0.063 N/A N/A Post Debridement Volume: (cm) Induration: Yes N/A N/A Periwound Skin Texture: No Abnormalities Noted N/A N/A Periwound Skin Moisture: No Abnormalities  Noted N/A N/A Periwound Skin Color: No Abnormality N/A N/A Temperature: Yes N/A N/A Tenderness on Palpation: Debridement N/A N/A Procedures Performed: Treatment Notes Electronic Signature(s) Signed: 05/21/2022 10:45:09 AM By: Fredirick Maudlin MD FACS Entered By: Fredirick Maudlin on 05/21/2022 10:45:09 Lauren [name] ([id number]) [id number].pdf Page 5 of 8 -------------------------------------------------------------------------------- Multi-Disciplinary Care Plan Details Patient Name: Date of Service: Lauren [name], Lauren [name] 05/21/2022 9:30 A M Medical Record Number: [id number] Patient Account Number: [id number] Date of Birth/Sex: Treating RN: [birth year] (42 y.o. Elam Dutch Primary Care Dai Mcadams: Marland Kitchen Other Clinician: Referring  Khloe Hunkele: Treating Carleigh Buccieri/Extender: [name] in Treatment: 0 Multidisciplinary Care Plan reviewed with physician Active Inactive Nutrition Nursing Diagnoses: Impaired glucose control: actual or potential Potential for alteratiion in Nutrition/Potential for imbalanced nutrition Goals: Patient/caregiver will maintain therapeutic glucose control Date Initiated: 05/21/2022 Target Resolution Date: 06/18/2022 Goal Status: Active Interventions: Assess HgA1c results as ordered upon admission and as needed Assess patient nutrition upon admission and as needed per policy Provide education on elevated blood sugars and impact on wound healing Treatment Activities: Patient referred to Primary Care Physician for further nutritional evaluation : 05/21/2022 Notes: Wound/Skin Impairment Nursing Diagnoses: Impaired tissue integrity Knowledge deficit related to ulceration/compromised skin integrity Goals: Patient/caregiver will verbalize understanding of skin care regimen Date Initiated: 05/21/2022 Target Resolution Date: 06/18/2022 Goal Status: Active Ulcer/skin breakdown will have a volume reduction of 30% by week 4 Date Initiated: 05/21/2022 Target Resolution Date: 06/18/2022 Goal Status: Active Interventions: Assess patient/caregiver ability to obtain necessary supplies Assess patient/caregiver ability to perform ulcer/skin care regimen upon admission and as needed Assess ulceration(s) every visit Provide education on ulcer and skin care Treatment Activities: Skin care regimen initiated : 05/21/2022 Topical wound management initiated : 05/21/2022 Notes: Electronic Signature(s) Signed: 05/21/2022 3:51:29 PM By: Baruch Gouty RN, BSN Entered By: Baruch Gouty on 05/21/2022 10:56:48 Lauren [name] ([id number]) [id number].pdf Page 6 of 8 -------------------------------------------------------------------------------- Pain Assessment  Details Patient Name: Date of Service: Lauren [name], Lauren [name] 05/21/2022 9:30 A M Medical Record Number: [id number] Patient Account Number: [id number] Date of Birth/Sex: Treating RN: 1980-12-08 (42 y.o. Elam Dutch Primary Care Redford Behrle: Marland Kitchen Other Clinician: Referring Freman Lapage: Treating Nautia Lem/Extender: [name] in Treatment: 0 Active Problems Location of Pain Severity and Description of Pain Patient Has Paino No Site Locations Rate the pain. Current Pain Level: 0 Pain Management and Medication Current Pain Management: Electronic Signature(s) Signed: 05/21/2022 3:51:29 PM By: Baruch Gouty RN, BSN Entered By: Baruch Gouty on 05/21/2022 10:27:09 -------------------------------------------------------------------------------- Patient/Caregiver Education Details Patient Name: Date of Service: Lauren [name] 1/19/2024andnbsp9:30 [place] Record Number: [id number] Patient Account Number: [id number] Date of Birth/Gender: Treating RN: [birth year] (42 y.o. Elam Dutch Primary Care Physician: Marland Kitchen Other Clinician: Referring Physician: Treating Physician/Extender: [name] in Treatment: 0 Education Assessment Education Provided To: Patient Education Topics Provided Elevated Blood Sugar/ Impact on Healing: Methods: Explain/Verbal Responses: Reinforcements needed, State content correctly [place], Lauren [name] ([id number]) [id number] Page 7 of 8 Wound/Skin Impairment: Methods: Explain/Verbal Responses: Reinforcements needed, State content correctly Electronic Signature(s) Signed: 05/21/2022 3:51:29 PM By: Baruch Gouty RN, BSN Entered By: Baruch Gouty on 05/21/2022 10:57:10 -------------------------------------------------------------------------------- Wound Assessment Details Patient Name: Date of Service: Lauren [name], Lauren [name] 05/21/2022 9:30 A M Medical Record  Number: [id number] Patient Account Number: [id number] Date of Birth/Sex: Treating RN: Oct 11, 1980 (42 y.o. Elam Dutch Primary Care Shamika Pedregon: Marland Kitchen Other Clinician: Referring Shayanna Thatch: Treating Salaam Battershell/Extender: [name] in Treatment: 0 Wound Status Wound Number: 1 Primary Atypical Etiology: Wound Location: Left  Abdomen - Lower Quadrant Wound Open Wounding Event: Bump Status: Date Acquired: 04/02/2022 Comorbid Anemia, Congestive Heart Failure, Coronary Artery Disease, Weeks Of Treatment: 0 History: Hypertension, Myocardial Infarction, Type I Diabetes, Clustered Wound: No Osteomyelitis, Neuropathy Photos Wound Measurements Length: (cm) 0.8 Width: (cm) 1 Depth: (cm) 0.1 Area: (cm) 0.628 Volume: (cm) 0.063 % Reduction in Area: % Reduction in Volume: Epithelialization: None Tunneling: No Undermining: No Wound Description Classification: Full Thickness Without Exposed Suppor Wound Margin: Flat and Intact Exudate Amount: Medium Exudate Type: Sanguinous Exudate Color: red t Structures Foul Odor After Cleansing: No Slough/Fibrino Yes Wound Bed Granulation Amount: Large (67-100%) Exposed Structure Granulation Quality: Red, Friable Fascia Exposed: No Necrotic Amount: Small (1-33%) Fat Layer (Subcutaneous Tissue) Exposed: Yes Necrotic Quality: Adherent Slough Tendon Exposed: No Muscle Exposed: No Joint Exposed: No Bone Exposed: No [address] Lauren [name], Lauren [name] ([id number]) [phone].pdf Page 8 of 8 Texture Color No Abnormalities Noted: No No Abnormalities Noted: Yes Induration: Yes Temperature / Pain Temperature: No Abnormality Moisture No Abnormalities Noted: Yes Tenderness on Palpation: Yes Treatment Notes Wound #1 (Abdomen - Lower Quadrant) Wound Laterality: Left Cleanser Peri-Wound Care Topical Primary Dressing Hydrofera Blue Ready Transfer Foam, 2.5x2.5 (in/in) Discharge  Instruction: Apply directly to wound bed as directed Secondary Dressing Zetuvit Plus Silicone Border Dressing 4x4 (in/in) Discharge Instruction: Apply silicone border over primary dressing as directed. Secured With Compression Wrap Compression Stockings Environmental education officer) Signed: 05/21/2022 3:51:29 PM By: Baruch Gouty RN, BSN Entered By: Baruch Gouty on 05/21/2022 10:26:08 -------------------------------------------------------------------------------- Vitals Details Patient Name: Date of Service: CHANTAL, WORTHEY 05/21/2022 9:30 A M Medical Record Number: [id number] Patient Account Number: [id number] Date of Birth/Sex: Treating RN: Jun 18, 1980 (42 y.o. Elam Dutch Primary Care Hero Kulish: Marland Kitchen Other Clinician: Referring Larine Fielding: Treating Olisa Quesnel/Extender: [name] in Treatment: 0 Vital Signs Time Taken: 09:52 Temperature (F): 98.3 Height (in): 65 Pulse (bpm): 85 Weight (lbs): 201 Respiratory Rate (breaths/min): 18 Body Mass Index (BMI): 33.4 Blood Pressure (mmHg): 141/92 Capillary Blood Glucose (mg/dl): 141 Reference Range: 80 - 120 mg / dl Notes glucose per pt's monitor Electronic Signature(s) Signed: 05/21/2022 3:51:29 PM By: Baruch Gouty RN, BSN Entered By: Baruch Gouty on 05/21/2022 09:54:38

## 2022-05-21 NOTE — Progress Notes (Signed)
Lauren, [name] ([id number]) [id number] Page 1 of 4 Visit Report for 05/21/2022 Abuse Risk Screen Details Patient Name: Date of Service: Lauren [name], Lauren [name] 05/21/2022 9:30 A M Medical Record Number: [id number] Patient Account Number: [id number] Date of Birth/Sex: Treating RN: June 13, 1980 (42 y.o. Elam Dutch Primary Care Arek Spadafore: Marland Kitchen Other Clinician: Referring Alyssha Housh: Treating Alvera Tourigny/Extender: [name] in Treatment: 0 Abuse Risk Screen Items Answer ABUSE RISK SCREEN: Has anyone close to you tried to hurt or harm you recentlyo No Do you feel uncomfortable with anyone in your familyo No Has anyone forced you do things that you didnt want to doo No Electronic Signature(s) Signed: 05/21/2022 3:51:29 PM By: Baruch Gouty RN, BSN Entered By: Baruch Gouty on 05/21/2022 10:12:18 -------------------------------------------------------------------------------- Activities of Daily Living Details Patient Name: Date of Service: Lauren, [name] 05/21/2022 9:30 A M Medical Record Number: [id number] Patient Account Number: [id number] Date of Birth/Sex: Treating RN: 06/20/1980 (42 y.o. Elam Dutch Primary Care Darenda Fike: Marland Kitchen Other Clinician: Referring Reata Petrov: Treating Cedricka Sackrider/Extender: [name] in Treatment: 0 Activities of Daily Living Items Answer Activities of Daily Living (Please select one for each item) Drive Automobile Not Able T Medications ake Completely Able Use T elephone Completely Able Care for Appearance Need Assistance Use T oilet Need Assistance Bath / Shower Need Assistance Dress Self Need Assistance Feed Self Completely Able Walk Completely Able Get In / Out Bed Completely Able Housework Need Assistance Prepare Meals Need Assistance Handle Money Completely Able Shop for Self Need Assistance Electronic Signature(s) Signed: 05/21/2022  3:51:29 PM By: Baruch Gouty RN, BSN Entered By: Baruch Gouty on 05/21/2022 10:15:03 [name] ([id number]) [id number] Page 2 of 4 -------------------------------------------------------------------------------- Education Screening Details Patient Name: Date of Service: Lauren, [name] 05/21/2022 9:30 A M Medical Record Number: [id number] Patient Account Number: [id number] Date of Birth/Sex: Treating RN: 02-Nov-1980 (42 y.o. Elam Dutch Primary Care Reshawn Ostlund: Marland Kitchen Other Clinician: Referring Shanyn Preisler: Treating Romi Rathel/Extender: [name] in Treatment: 0 Primary Learner Assessed: Patient Learning Preferences/Education Level/Primary Language Learning Preference: Explanation, Demonstration, Printed Material Highest Education Level: College or Above Preferred Language: English Cognitive Barrier Language Barrier: No Translator Needed: No Memory Deficit: No Emotional Barrier: No Cultural/Religious Beliefs Affecting Medical Care: No Physical Barrier Impaired Vision: Yes Glasses Impaired Hearing: No Decreased Hand dexterity: Yes Limitations: left hand Knowledge/Comprehension Knowledge Level: High Comprehension Level: High Ability to understand written instructions: High Ability to understand verbal instructions: High Motivation Anxiety Level: Calm Cooperation: Cooperative Education Importance: Acknowledges Need Interest in Health Problems: Asks Questions Perception: Coherent Willingness to Engage in Self-Management High Activities: Readiness to Engage in Self-Management High Activities: Electronic Signature(s) Signed: 05/21/2022 3:51:29 PM By: Baruch Gouty RN, BSN Entered By: Baruch Gouty on 05/21/2022 10:16:00 -------------------------------------------------------------------------------- Fall Risk Assessment Details Patient Name: Date of Service: Lauren, [name] 05/21/2022 9:30  A M Medical Record Number: [id number] Patient Account Number: [id number] Date of Birth/Sex: Treating RN: 1980-12-14 (42 y.o. Elam Dutch Primary Care Nasier Thumm: Marland Kitchen Other Clinician: Referring Camala Talwar: Treating Namiah Dunnavant/Extender: [name] in Treatment: 0 Fall Risk Assessment Items Have you had 2 or more falls in the last 12 monthso 0 No NAIRI, OSWALD ([id number]) [phone] [id number] Page 3 of 4 Have you had any fall that resulted in injury in the last 12 monthso 0 No FALLS RISK SCREEN History of falling - immediate or within 3 months 25 Yes Secondary diagnosis (Do you have 2 or more medical diagnoseso) 0 No Ambulatory aid None/bed rest/wheelchair/nurse 0  No Crutches/cane/walker 15 Yes Furniture 0 No Intravenous therapy Access/Saline/Heparin Lock 0 No Gait/Transferring Normal/ bed rest/ wheelchair 0 No Weak (short steps with or without shuffle, stooped but able to lift head while walking, may seek 10 Yes support from furniture) Impaired (short steps with shuffle, may have difficulty arising from chair, head down, impaired 0 No balance) Mental Status Oriented to own ability 0 Yes Electronic Signature(s) Signed: 05/21/2022 3:51:29 PM By: Baruch Gouty RN, BSN Entered By: Baruch Gouty on 05/21/2022 10:16:41 -------------------------------------------------------------------------------- Foot Assessment Details Patient Name: Date of Service: Lauren, [name] 05/21/2022 9:30 A M Medical Record Number: [id number] Patient Account Number: [id number] Date of Birth/Sex: Treating RN: 1980-06-24 (42 y.o. Elam Dutch Primary Care Paarth Cropper: Marland Kitchen Other Clinician: Referring Ritvik Mczeal: Treating Osten Janek/Extender: [name] in Treatment: 0 Foot Assessment Items Site Locations + = Sensation present, - = Sensation absent, C = Callus, U = Ulcer R = Redness, W = Warmth, M =  Maceration, PU = Pre-ulcerative lesion F = Fissure, S = Swelling, D = Dryness Assessment Right: Left: Other Deformity: No No Prior Foot Ulcer: No Yes Prior Amputation: No Yes Charcot Joint: No No Ambulatory Status: Ambulatory With Help Assistance Device: [name], [name] ([id number]) [id number] Page 4 of 4 Gait: Steady Electronic Signature(s) Signed: 05/21/2022 3:51:29 PM By: Baruch Gouty RN, BSN Entered By: Baruch Gouty on 05/21/2022 10:17:31 -------------------------------------------------------------------------------- Nutrition Risk Screening Details Patient Name: Date of Service: JNAYA, BUTRICK 05/21/2022 9:30 A M Medical Record Number: [id number] Patient Account Number: [id number] Date of Birth/Sex: Treating RN: [birth year] (42 y.o. Elam Dutch Primary Care Yazmeen Woolf: Marland Kitchen Other Clinician: Referring Detravion Tester: Treating Damin Salido/Extender: [name] in Treatment: 0 Height (in): 65 Weight (lbs): 201 Body Mass Index (BMI): 33.4 Nutrition Risk Screening Items Score Screening NUTRITION RISK SCREEN: I have an illness or condition that made me change the kind and/or amount of food I eat 0 No I eat fewer than two meals per day 0 No I eat few fruits and vegetables, or milk products 0 No I have three or more drinks of beer, liquor or wine almost every day 0 No I have tooth or mouth problems that make it hard for me to eat 0 No I don't always have enough money to buy the food I need 0 No I eat alone most of the time 0 No I take three or more different prescribed or over-the-counter drugs a day 1 Yes Without wanting to, I have lost or gained 10 pounds in the last six months 0 No I am not always physically able to shop, cook and/or feed myself 2 Yes Nutrition Protocols Good Risk Protocol Provide education on elevated blood Moderate Risk Protocol 0 sugars and impact on wound healing, as  applicable High Risk Proctocol Risk Level: Moderate Risk Score: 3 Electronic Signature(s) Signed: 05/21/2022 3:51:29 PM By: Baruch Gouty RN, BSN Entered By: Baruch Gouty on 05/21/2022 10:17:11

## 2022-05-24 ENCOUNTER — Ambulatory Visit: Payer: BC Managed Care – PPO | Attending: Cardiology | Admitting: *Deleted

## 2022-05-24 DIAGNOSIS — Z5181 Encounter for therapeutic drug level monitoring: Secondary | ICD-10-CM

## 2022-05-24 DIAGNOSIS — L98499 Non-pressure chronic ulcer of skin of other sites with unspecified severity: Secondary | ICD-10-CM | POA: Diagnosis not present

## 2022-05-24 DIAGNOSIS — I639 Cerebral infarction, unspecified: Secondary | ICD-10-CM

## 2022-05-24 LAB — POCT INR: INR: 1.4 — AB (ref 2.0–3.0)

## 2022-05-24 NOTE — Patient Instructions (Signed)
Take warfarin 2 tablets tonight, 1 1/2 tablets tonight then increase dose to 1 tablet daily except 1 1/2 tablets on Wednesdays and Saturdays Will recheck in 4 days

## 2022-05-25 ENCOUNTER — Other Ambulatory Visit (HOSPITAL_COMMUNITY): Payer: Self-pay

## 2022-05-25 ENCOUNTER — Other Ambulatory Visit: Payer: Self-pay

## 2022-05-25 ENCOUNTER — Ambulatory Visit (INDEPENDENT_AMBULATORY_CARE_PROVIDER_SITE_OTHER): Payer: BC Managed Care – PPO | Admitting: Podiatry

## 2022-05-25 DIAGNOSIS — E10621 Type 1 diabetes mellitus with foot ulcer: Secondary | ICD-10-CM | POA: Diagnosis not present

## 2022-05-25 DIAGNOSIS — L97425 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 without evidence of necrosis: Secondary | ICD-10-CM | POA: Diagnosis not present

## 2022-05-25 MED ORDER — AMOXICILLIN-POT CLAVULANATE 875-125 MG PO TABS: 1.0000 | ORAL_TABLET | Freq: Two times a day (BID) | ORAL | 0 refills | Status: DC

## 2022-05-26 ENCOUNTER — Encounter: Payer: Self-pay | Admitting: Podiatry

## 2022-05-26 ENCOUNTER — Other Ambulatory Visit (HOSPITAL_COMMUNITY): Payer: Self-pay

## 2022-05-26 MED ORDER — OXYCODONE HCL 5 MG PO TABS: 5.0000 mg | ORAL_TABLET | ORAL | 0 refills | Status: AC | PRN

## 2022-05-26 NOTE — Addendum Note (Signed)
Addended bySherryle Lis, Carisha Kantor R on: 05/26/2022 07:49 PM   Modules accepted: Orders

## 2022-05-27 ENCOUNTER — Ambulatory Visit: Payer: BC Managed Care – PPO | Attending: Cardiology | Admitting: *Deleted

## 2022-05-27 ENCOUNTER — Other Ambulatory Visit (HOSPITAL_COMMUNITY): Payer: Self-pay

## 2022-05-27 DIAGNOSIS — I639 Cerebral infarction, unspecified: Secondary | ICD-10-CM

## 2022-05-27 DIAGNOSIS — Z5181 Encounter for therapeutic drug level monitoring: Secondary | ICD-10-CM | POA: Diagnosis not present

## 2022-05-27 LAB — POCT INR: INR: 3.1 — AB (ref 2.0–3.0)

## 2022-05-27 NOTE — Patient Instructions (Signed)
Continue warfarin 1 tablet daily except 1 1/2 tablets on Wednesdays and Saturdays Will recheck in 1 wk

## 2022-05-28 ENCOUNTER — Other Ambulatory Visit (HOSPITAL_COMMUNITY): Payer: Self-pay

## 2022-05-29 NOTE — Progress Notes (Signed)
  Subjective:  Patient ID: Lauren [name], female    DOB: [birth year],  MRN: [id number]  Chief Complaint  Patient presents with   Diabetic Ulcer    LT FOOT WOUND, 3 week follow up    42 y.o. female presents with the above complaint. History confirmed with patient.  She is still having some pain on the outside of the foot where the wound is.  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  She has   ulceration measuring 1.8 x 1.4 x 0.3 cm on the fifth metatarsal base with surrounding erythema no drainage no exposed bone tendon or joint.  Thickened elongated yellow discolored nail with subungual debris x 9   Assessment:   1. 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   Ulcer left foot -We discussed the etiology and factors that are a part of the wound healing process.  We also discussed the risk of infection both soft tissue and osteomyelitis from open ulceration.  Discussed the risk of limb loss if this happens or worsens. -Debridement as below. -Dressed with iodosorb, DSD. -Continue home dressing changes daily with 4 x 4 gauze and iodosorb -Continue off-loading with surgical shoe. -Fifth metatarsal wound continues to progress.  She is going to wound care center for her abdominal wound.  I will discuss with her doctors there about caring for the foot as well to see if they have any modalities that may be beneficial for this.  Do not know if she will tolerate total contact casting with her stroke weakness.  We did dispense offloading foot and heel protector boots to keep pressure off while sleeping and in bed if she sleeps on her left side.  New ABIs ordered  Procedure: Excisional Debridement of Wound Rationale: Removal of  non-viable soft tissue from the wound to promote healing.  Anesthesia: none Post-Debridement Wound Measurements: Noted above Type of Debridement: Sharp Excisional Tissue Removed: Non-viable soft tissue Depth of Debridement: subcutaneous tissue. Technique: Sharp excisional debridement to bleeding, viable wound base.  Dressing: Dry, sterile, compression dressing. Disposition: Patient tolerated procedure well.           No follow-ups on file.

## 2022-05-31 ENCOUNTER — Encounter (HOSPITAL_BASED_OUTPATIENT_CLINIC_OR_DEPARTMENT_OTHER): Payer: BC Managed Care – PPO | Admitting: General Surgery

## 2022-05-31 ENCOUNTER — Encounter: Payer: Self-pay | Admitting: Physical Medicine & Rehabilitation

## 2022-05-31 ENCOUNTER — Encounter: Payer: BC Managed Care – PPO | Attending: Registered Nurse | Admitting: Physical Medicine & Rehabilitation

## 2022-05-31 VITALS — BP 129/85 | HR 83 | Ht 65.0 in | Wt 213.0 lb

## 2022-05-31 DIAGNOSIS — I639 Cerebral infarction, unspecified: Secondary | ICD-10-CM | POA: Diagnosis not present

## 2022-05-31 DIAGNOSIS — E11621 Type 2 diabetes mellitus with foot ulcer: Secondary | ICD-10-CM | POA: Insufficient documentation

## 2022-05-31 DIAGNOSIS — I10 Essential (primary) hypertension: Secondary | ICD-10-CM | POA: Insufficient documentation

## 2022-05-31 DIAGNOSIS — R252 Cramp and spasm: Secondary | ICD-10-CM | POA: Diagnosis present

## 2022-05-31 DIAGNOSIS — S43002S Unspecified subluxation of left shoulder joint, sequela: Secondary | ICD-10-CM | POA: Insufficient documentation

## 2022-05-31 DIAGNOSIS — E10622 Type 1 diabetes mellitus with other skin ulcer: Secondary | ICD-10-CM | POA: Diagnosis not present

## 2022-05-31 DIAGNOSIS — L98492 Non-pressure chronic ulcer of skin of other sites with fat layer exposed: Secondary | ICD-10-CM | POA: Diagnosis not present

## 2022-05-31 DIAGNOSIS — I63511 Cerebral infarction due to unspecified occlusion or stenosis of right middle cerebral artery: Secondary | ICD-10-CM | POA: Diagnosis not present

## 2022-05-31 DIAGNOSIS — L97529 Non-pressure chronic ulcer of other part of left foot with unspecified severity: Secondary | ICD-10-CM | POA: Insufficient documentation

## 2022-05-31 DIAGNOSIS — L97522 Non-pressure chronic ulcer of other part of left foot with fat layer exposed: Secondary | ICD-10-CM | POA: Diagnosis not present

## 2022-05-31 DIAGNOSIS — E10621 Type 1 diabetes mellitus with foot ulcer: Secondary | ICD-10-CM | POA: Diagnosis not present

## 2022-05-31 NOTE — Progress Notes (Signed)
Lauren [name], Lauren [name] ([id number]) [id number] Page 1 of 8 Visit Report for 05/31/2022 Arrival Information Details Patient Name: Date of Service: Lauren [name], Lauren [name] 05/31/2022 2:00 PM Medical Record Number: [id number] Patient Account Number: [id number] Date of Birth/Sex: Treating RN: [birth year] (42 y.o. Lauren [name], Lauren [name] Primary Care Lauren [name] Lauren [name]: Lauren [name] Kitchen Other Clinician: Referring Lauren [name]: Treating Lauren [name]/Extender: Lauren [name] in Treatment: 1 Visit Information History Since Last Visit Added or deleted any medications: No Patient Arrived: Ambulatory Any new allergies or adverse reactions: No Arrival Time: 13:50 Had a fall or experienced change in No Accompanied By: self activities of daily living that may affect Transfer Assistance: None risk of falls: Patient Identification Verified: Yes Signs or symptoms of abuse/neglect since last visito No Secondary Verification Process Completed: Yes Hospitalized since last visit: No Patient Requires Transmission-Based Precautions: No Implantable device outside of the clinic excluding No Patient Has Alerts: No cellular tissue based products placed in the center since last visit: Has Dressing in Place as Prescribed: Yes Pain Present Now: Yes Electronic Signature(s) Signed: 05/31/2022 4:46:33 PM By: Adline Peals Entered By: Adline Peals on 05/31/2022 13:50:45 -------------------------------------------------------------------------------- Encounter Discharge Information Details Patient Name: Date of Service: Lauren [name], Lauren [name] 05/31/2022 2:00 PM Medical Record Number: [id number] Patient Account Number: [id number] Date of Birth/Sex: Treating RN: December 25, 1980 (42 y.o. Lauren [name] Primary Care Eastyn Dattilo: Lauren [name] Kitchen Other Clinician: Referring Arrin Ishler: Treating Kaidan Harpster/Extender: Lauren [name] in Treatment: 1 Encounter Discharge Information Items  Post Procedure Vitals Discharge Condition: Stable Temperature (F): 97.7 Ambulatory Status: Ambulatory Pulse (bpm): 86 Discharge Destination: Home Respiratory Rate (breaths/min): 18 Transportation: Private Auto Blood Pressure (mmHg): 151/85 Accompanied By: self Schedule Follow-up Appointment: Yes Clinical Summary of Care: Patient Declined Electronic Signature(s) Signed: 05/31/2022 4:46:33 PM By: Adline Peals Entered By: Adline Peals on 05/31/2022 14:39:42 Lauren [name] ([id number]) [id number] Page 2 of 8 -------------------------------------------------------------------------------- Lower Extremity Assessment Details Patient Name: Date of Service: Lauren [name] 05/31/2022 2:00 PM Medical Record Number: [id number] Patient Account Number: [id number] Date of Birth/Sex: Treating RN: 1980-05-26 (42 y.o. Lauren [name] Primary Care Ilia Dimaano: Lauren [name] Kitchen Other Clinician: Referring Conlan Miceli: Treating Kelcy Laible/Extender: Lauren [name] in Treatment: 1 Electronic Signature(s) Signed: 05/31/2022 4:46:33 PM By: Sabas Sous By: Adline Peals on 05/31/2022 13:51:10 -------------------------------------------------------------------------------- Multi Wound Chart Details Patient Name: Date of Service: Lauren [name] 05/31/2022 2:00 PM Medical Record Number: [id number] Patient Account Number: [id number] Date of Birth/Sex: Treating RN: [birth year] (42 y.o. 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 Primary Etiology: Extremity Anemia, Congestive Heart Failure, Anemia, Congestive Heart Failure, N/A Comorbid History: Coronary Artery Disease, Coronary Artery Disease, Hypertension, Myocardial Infarction, Hypertension, Myocardial Infarction, Type I Diabetes, Osteomyelitis, Type I Diabetes, Osteomyelitis, Neuropathy Neuropathy 04/02/2022 03/03/2022 N/A Date Acquired: 1 0 N/A Weeks of Treatment: Healed - Epithelialized Open N/A Wound Status: No No N/A Wound Recurrence: 0x0x0 1.8x2x0.1 N/A Measurements L x W x D (cm) 0 2.827 N/A A (cm) : rea 0 0.283 N/A Volume (cm) : 100.00% N/A N/A % Reduction in Area: 100.00% N/A N/A % Reduction in Volume: Full Thickness Without Exposed Grade 1 N/A Classification: Support Structures None Present Medium N/A Exudate Amount: N/A Serosanguineous N/A  Exudate Type: N/A red, brown N/A Exudate Color: Lauren [name], Lauren [name] ([id number]) [id number] Page 3 of 8 Distinct, outline attached Distinct, outline attached N/A Wound Margin: None Present (0%) Small (1-33%) N/A Granulation Amount: N/A Red, Pink N/A Granulation Quality: None Present (0%) Large (67-100%) N/A Necrotic Amount: N/A Eschar, Adherent Slough N/A Necrotic Tissue: Fascia: No Fat Layer (Subcutaneous Tissue): Yes N/A Exposed Structures: Fat Layer (Subcutaneous Tissue): No Fascia: No Tendon: No Tendon: No Muscle: No Muscle: No Joint: No Joint: No Bone: No Bone: No Large (67-100%) None N/A Epithelialization: Debridement - Selective/Open Wound Debridement - Excisional N/A Debridement: Pre-procedure Verification/Time Out 14:02 14:02 N/A Taken: Lidocaine 5% topical ointment Lidocaine 5% topical ointment N/A Pain Control: Necrotic/Eschar Necrotic/Eschar, Subcutaneous, N/A Tissue Debrided: Slough Non-Viable Tissue Skin/Subcutaneous  Tissue N/A Level: 0.04 3.6 N/A Debridement A (sq cm): rea Curette Curette N/A Instrument: Minimum Minimum N/A Bleeding: Pressure Pressure N/A Hemostasis A chieved: Procedure was tolerated well Procedure was tolerated well N/A Debridement Treatment Response: 0.2x0.2x0.1 1.8x2x0.1 N/A Post Debridement Measurements L x W x D (cm) 0.003 0.283 N/A Post Debridement Volume: (cm) Induration: Yes No Abnormalities Noted N/A Periwound Skin Texture: No Abnormalities Noted Dry/Scaly: Yes N/A Periwound Skin Moisture: No Abnormalities Noted No Abnormalities Noted N/A Periwound Skin Color: No Abnormality No Abnormality N/A Temperature: Debridement Debridement N/A Procedures Performed: Treatment Notes Electronic Signature(s) Signed: 05/31/2022 2:30:18 PM By: Fredirick Maudlin MD FACS Entered By: Fredirick Maudlin on 05/31/2022 14:30:18 -------------------------------------------------------------------------------- Multi-Disciplinary Care Plan Details Patient Name: Date of Service: Lauren [name], Lauren [name] 05/31/2022 2:00 PM Medical Record Number: [id number] Patient Account Number: [id number] Date of Birth/Sex: Treating RN: [birth year] (42 y.o. Lauren [name] Primary Care Toshia Larkin: Lauren [name] Kitchen Other Clinician: Referring Jamina Macbeth: Treating Brandon Wiechman/Extender: Lauren [name] in Treatment: 1 Ulen reviewed with physician Active Inactive Nutrition Nursing Diagnoses: Impaired glucose control: actual or potential Potential for alteratiion in Nutrition/Potential for imbalanced nutrition Goals: Patient/caregiver will maintain therapeutic glucose control Date Initiated: 05/21/2022 Target Resolution Date: 06/18/2022 Goal Status: Active Interventions: Assess HgA1c results as ordered upon admission and as needed Assess patient nutrition upon admission and as needed per policy Provide education on elevated blood sugars and impact on wound  healing Treatment Activities: Lauren [name], Lauren [name] ([id number]) [phone].pdf Page 4 of 8 Patient referred to Primary Care Physician for further nutritional evaluation : 05/21/2022 Notes: Wound/Skin Impairment Nursing Diagnoses: Impaired tissue integrity Knowledge deficit related to ulceration/compromised skin integrity Goals: Patient/caregiver will verbalize understanding of skin care regimen Date Initiated: 05/21/2022 Target Resolution Date: 06/18/2022 Goal Status: Active Ulcer/skin breakdown will have a volume reduction of 30% by week 4 Date Initiated: 05/21/2022 Target Resolution Date: 06/18/2022 Goal Status: Active Interventions: Assess patient/caregiver ability to obtain necessary supplies Assess patient/caregiver ability to perform ulcer/skin care regimen upon admission and as needed Assess ulceration(s) every visit Provide education on ulcer and skin care Treatment Activities: Skin care regimen initiated : 05/21/2022 Topical wound management initiated : 05/21/2022 Notes: Electronic Signature(s) Signed: 05/31/2022 4:46:33 PM By: Adline Peals Entered By: Adline Peals on 05/31/2022 14:02:16 -------------------------------------------------------------------------------- Pain Assessment Details Patient Name: Date of Service: Lauren [name], Lauren [name] 05/31/2022 2:00 PM Medical Record Number: [id number] Patient Account Number: [id number] Date of Birth/Sex: Treating RN: Oct 22, 1980 (42 y.o. Lauren [name] Primary Care Swayzie Choate: Lauren [name] Kitchen Other Clinician: Referring Zayli Villafuerte: Treating Rayder Sullenger/Extender: Lauren [name] in Treatment: 1 Active Problems Location of Pain Severity and Description of Pain Patient Has Paino Yes Site Locations Pain Location: Pain in Ulcers Duration of the Pain. Constant / Intermittento Constant Rate the pain. Current Pain  Level: 9 Character of Pain Describe the Pain: Kareem Aul, Sharran  ([id number]) [id number] Page 5 of 8 Pain Management and Medication Current Pain Management: Electronic Signature(s) Signed: 05/31/2022 4:46:33 PM By: Adline Peals Entered By: Adline Peals on 05/31/2022 13:51:07 -------------------------------------------------------------------------------- Patient/Caregiver Education Details Patient Name: Date of Service: AASIA, PEAVLER 1/29/2024andnbsp2:00 PM Medical Record Number: [id number] Patient Account Number: [id number] Date of Birth/Gender: Treating RN: [birth year] (42 y.o. Lauren [name] Primary Care Physician: Lauren [name] Kitchen Other Clinician: Referring Physician: Treating Physician/Extender: Lauren [name] in Treatment: 1 Education Assessment Education Provided To: Patient Education Topics Provided Wound/Skin Impairment: Methods: Explain/Verbal Responses: Reinforcements needed, State content correctly Electronic Signature(s) Signed: 05/31/2022 4:46:33 PM By: Adline Peals Entered By: Adline Peals on 05/31/2022 14:02:31 -------------------------------------------------------------------------------- Wound Assessment Details Patient Name: Date of Service: Lauren [name], Lauren [name] 05/31/2022 2:00 PM Medical Record Number: [id number] Patient Account Number: [id number] Date of Birth/Sex: Treating RN: June 06, 1980 (42 y.o. Lauren [name] Primary Care Shadai Mcclane: Lauren [name] Kitchen Other Clinician: Referring Keyandra Swenson: Treating Eliyana Pagliaro/Extender: Lauren [name] in Treatment: 1 Wound Status Wound Number: 1 Primary Atypical Etiology: Wound Location: Left Abdomen - Lower Quadrant Wound Healed - Epithelialized Wounding Event: Bump Status: Date Acquired: 04/02/2022 Comorbid Anemia, Congestive Heart Failure, Coronary Artery Disease, Weeks Of Treatment: 1 History: Hypertension, Myocardial Infarction, Type I Diabetes, Clustered Wound: No Osteomyelitis,  Neuropathy Photos Lauren [name], Lauren [name] ([id number]) [id number] Page 6 of 8 Wound Measurements Length: (cm) Width: (cm) Depth: (cm) Area: (cm) Volume: (cm) 0 % Reduction in Area: 100% 0 % Reduction in Volume: 100% 0 Epithelialization: Large (67-100%) 0 Tunneling: No 0 Undermining: No Wound Description Classification: Full Thickness Without Exposed Support Structures Wound Margin: Distinct, outline attached Exudate Amount: None Present Foul Odor After Cleansing: No Slough/Fibrino No Wound Bed Granulation Amount: None Present (0%) Exposed Structure Necrotic Amount: None Present (0%) Fascia Exposed: No Fat Layer (Subcutaneous Tissue) Exposed: No Tendon Exposed: No Muscle Exposed: No Joint Exposed: No Bone Exposed: No Periwound Skin Texture Texture Color No Abnormalities Noted: Yes No Abnormalities Noted: Yes Moisture Temperature / Pain No Abnormalities Noted: Yes Temperature: No Abnormality Electronic Signature(s) Signed: 05/31/2022 4:46:33 PM By: Adline Peals Entered By: Adline Peals on 05/31/2022 14:05:52 -------------------------------------------------------------------------------- Wound Assessment Details Patient Name: Date of Service: Lauren [name], Lauren [name] 05/31/2022 2:00 PM Medical Record Number: [id number] Patient Account Number: [id number] Date of Birth/Sex: Treating RN: [birth year] (42 y.o. Lauren [name] Primary Care Lavonya Hoerner: Lauren [name] Kitchen Other Clinician: Referring Alysson Geist: Treating Talar Fraley/Extender: Lauren [name] in Treatment: 1 Wound Status Wound Number: 2 Primary Diabetic Wound/Ulcer of the Lower Extremity Etiology: Wound Location: Left, Plantar Foot Wound Open Wounding Event: Gradually Appeared Status: Date Acquired: 03/03/2022 Comorbid Anemia, Congestive Heart Failure, Coronary Artery Disease, Weeks Of Treatment: 0 History: Hypertension, Myocardial Infarction, Type I  Diabetes, Clustered Wound: No Osteomyelitis, Neuropathy Photos Lauren [name], Lauren [name] ([id number]) [id number] Page 7 of 8 Wound Measurements Length: (cm) 1.8 Width: (cm) 2 Depth: (cm) 0.1 Area: (cm) 2.827 Volume: (cm) 0.283 % Reduction in Area: % Reduction in Volume: Epithelialization: None Tunneling: No Undermining: No Wound Description Classification: Grade 1 Wound Margin: Distinct, outline attached Exudate Amount: Medium Exudate Type: Serosanguineous Exudate Color: red, brown Foul Odor After Cleansing: No Slough/Fibrino Yes Wound Bed Granulation Amount: Small (1-33%) Exposed Structure Granulation Quality: Red, Pink Fascia Exposed: No Necrotic Amount: Large (67-100%) Fat Layer (Subcutaneous Tissue) Exposed: Yes Necrotic Quality: Eschar, Adherent Slough Tendon Exposed: No Muscle Exposed: No Joint Exposed: No Bone Exposed: No Periwound Skin Texture Texture Color No Abnormalities Noted: Yes No Abnormalities Noted: Yes  Moisture Temperature / Pain No Abnormalities Noted: No Temperature: No Abnormality Dry / Scaly: Yes Treatment Notes Wound #2 (Foot) Wound Laterality: Plantar, Left Cleanser Soap and Water Discharge Instruction: May shower and wash wound with dial antibacterial soap and water prior to dressing change. Wound Cleanser Discharge Instruction: Cleanse the wound with wound cleanser prior to applying a clean dressing using gauze sponges, not tissue or cotton balls. Peri-Wound Care Skin Prep Discharge Instruction: Use skin prep as directed Topical Primary Dressing Santyl Ointment Discharge Instruction: Apply nickel thick amount to wound bed as instructed Secondary Dressing Zetuvit Plus Silicone Border Dressing 4x4 (in/in) Discharge Instruction: Apply silicone border over primary dressing as directed. Secured With Compression Wrap Compression Stockings Add-Ons ERMEL, VERNE ([id number]) [id number]  Page 8 of 8 Electronic Signature(s) Signed: 05/31/2022 4:46:33 PM By: Sabas Sous By: Adline Peals on 05/31/2022 13:58:21 -------------------------------------------------------------------------------- Vitals Details Patient Name: Date of Service: JAMAR, CASAGRANDE 05/31/2022 2:00 PM Medical Record Number: [id number] Patient Account Number: [id number] Date of Birth/Sex: Treating RN: 1980-06-28 (42 y.o. Lauren [name] Primary Care Kadiatou Oplinger: Lauren [name] Kitchen Other Clinician: Referring Izzak Fries: Treating Glenna Brunkow/Extender: Lauren [name] in Treatment: 1 Vital Signs Time Taken: 13:50 Temperature (F): 97.7 Height (in): 65 Pulse (bpm): 86 Weight (lbs): 201 Respiratory Rate (breaths/min): 18 Body Mass Index (BMI): 33.4 Blood Pressure (mmHg): 151/85 Capillary Blood Glucose (mg/dl): 165 Reference Range: 80 - 120 mg / dl Electronic Signature(s) Signed: 05/31/2022 4:46:33 PM By: Adline Peals Entered By: Adline Peals on 05/31/2022 13:51:14

## 2022-05-31 NOTE — Progress Notes (Signed)
ZALA, DEGRASSE ([id number]) [id number] Page 1 of 10 Visit Report for 05/31/2022 Chief Complaint Document Details Patient Name: Date of Service: Lauren [name], Lauren [name] 05/31/2022 2:00 PM Medical Record Number: [id number] Patient Account Number: [id number] Date of Birth/Sex: Treating RN: [birth year] (42 y.o. F) Primary Care Provider: Marland Kitchen Other Clinician: Referring Provider: Treating Provider/Extender: [name] in Treatment: 1 Information Obtained from: Patient Chief Complaint Patients presents for treatment of an open diabetic ulcer on her abdomen Electronic Signature(s) Signed: 05/31/2022 2:30:24 PM By: Fredirick Maudlin MD FACS Entered By: Fredirick Maudlin on 05/31/2022 14:30:24 -------------------------------------------------------------------------------- Debridement Details Patient Name: Date of Service: Lauren [name], Lauren [name] 05/31/2022 2:00 PM Medical Record Number: [id number] Patient Account Number: [id number] Date of Birth/Sex: Treating RN: December 08, 1980 (42 y.o. Harlow Ohms Primary Care Provider: Marland Kitchen Other Clinician: Referring Provider: Treating Provider/Extender: [name] in Treatment: 1 Debridement Performed for Assessment: Wound #1 Left Abdomen - Lower Quadrant Performed By: Physician Fredirick Maudlin, MD Debridement Type: Debridement Level of Consciousness (Pre-procedure): Awake and Alert Pre-procedure Verification/Time Out Yes - 14:02 Taken: Start Time: 14:02 Pain Control: Lidocaine 5% topical ointment T Area Debrided (L x W): otal 0.2 (cm) x 0.2 (cm) = 0.04 (cm) Tissue and other material debrided: Non-Viable, Eschar Level: Non-Viable Tissue Debridement Description: Selective/Open Wound Instrument: Curette Bleeding: Minimum Hemostasis Achieved: Pressure Response to Treatment: Procedure was tolerated well Level of Consciousness (Post- Awake and  Alert procedure): Post Debridement Measurements of Total Wound Length: (cm) 0.2 Width: (cm) 0.2 Depth: (cm) 0.1 Volume: (cm) 0.003 Character of Wound/Ulcer Post Debridement: Improved Post Procedure Diagnosis Same as Lauren [name] ([id number]) [id number] Page 2 of 10 Notes scribed for Dr. Celine Ahr by Adline Peals, RN Electronic Signature(s) Signed: 05/31/2022 4:07:33 PM By: Fredirick Maudlin MD FACS Signed: 05/31/2022 4:46:33 PM By: Sabas Sous By: Adline Peals on 05/31/2022 14:04:19 -------------------------------------------------------------------------------- Debridement Details Patient Name: Date of Service: Lauren [name], Lauren [name] 05/31/2022 2:00 PM Medical Record Number: [id number] Patient Account Number: [id number] Date of Birth/Sex: Treating RN: [birth year] (42 y.o. Harlow Ohms Primary Care Provider: Marland Kitchen Other Clinician: Referring Provider: Treating Provider/Extender: [name] in Treatment: 1 Debridement Performed for Assessment: Wound #2 [place] Performed By: Physician Fredirick Maudlin, MD Debridement Type: Debridement Severity of Tissue Pre Debridement: Fat layer exposed Level of Consciousness (Pre-procedure): Awake and Alert Pre-procedure Verification/Time Out Yes - 14:02 Taken: Start Time: 14:02 Pain Control: Lidocaine 5% topical ointment T Area Debrided (L x W): otal 1.8 (cm) x 2 (cm) = 3.6 (cm) Tissue and other material debrided: Non-Viable, Eschar, Slough, Subcutaneous, Slough Level: Skin/Subcutaneous Tissue Debridement Description: Excisional Instrument: Curette Bleeding: Minimum Hemostasis Achieved: Pressure Response to Treatment: Procedure was tolerated well Level of Consciousness (Post- Awake and Alert procedure): Post Debridement Measurements of Total Wound Length: (cm) 1.8 Width: (cm) 2 Depth: (cm) 0.1 Volume: (cm)  0.283 Character of Wound/Ulcer Post Debridement: Improved Severity of Tissue Post Debridement: Fat layer exposed Post Procedure Diagnosis Same as Pre-procedure Notes scribed for Dr. Celine Ahr by Adline Peals, RN Electronic Signature(s) Signed: 05/31/2022 4:07:33 PM By: Fredirick Maudlin MD FACS Signed: 05/31/2022 4:46:33 PM By: Adline Peals Entered By: Adline Peals on 05/31/2022 14:08:18 -------------------------------------------------------------------------------- HPI Details Patient Name: Date of Service: Lauren [name], Lauren [name] 05/31/2022 2:00 PM Medical Record Number: [id number] Patient Account Number: [id number] Lauren [name] ([id number]) [id number] Page 3 of 10 Date of Birth/Sex: Treating RN: May 14, 1980 (42 y.o. 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 She has been on Lovenox injections and also wears cutaneous blood sugar monitor and insulin pump. Apparently about a month ago, she developed a small lump under the skin that subsequently ruptured. This is left a small ulcer in her left lower abdomen. Her primary care provider has had her applying Neosporin and Band-Aids. According to the patient and her daughter, there is really been no change or improvement since that time. 05/31/2022: The wound on her abdomen has a layer of eschar on it. Her podiatrist, [name], has also asked [name] to evaluate the wound on her foot. There  is slough and eschar present. Apparently they had been using Iodosorb but recently changed to [facility]. Electronic Signature(s) Signed: 05/31/2022 2:31:14 PM By: Fredirick Maudlin MD FACS Entered By: Fredirick Maudlin on 05/31/2022 14:31:14 -------------------------------------------------------------------------------- Physical Exam Details Patient Name: Date of Service: Lauren [name], Lauren [name] 05/31/2022 2:00 PM Medical Record Number: [id number] Patient Account Number: [id number] Date of Birth/Sex: Treating RN: 11-24-1980 (42 y.o. F) Primary Care Provider: Marland Kitchen Other Clinician: Referring Provider: Treating Provider/Extender: [name] in Treatment: 1 Constitutional Hypertensive, asymptomatic. . . . no acute distress. Respiratory Normal work of breathing on room air. Notes 05/31/2022: The wound on her abdomen has a layer of dry eschar on it. Her podiatrist, [name], has also asked [name] to evaluate the wound on her foot. There is slough and leathery eschar present. Electronic Signature(s) Signed: 05/31/2022 2:42:22 PM By: Fredirick Maudlin MD FACS Previous Signature: 05/31/2022 2:32:45 PM Version By: Fredirick Maudlin MD FACS Entered By: Fredirick Maudlin on 05/31/2022 14:42:22 -------------------------------------------------------------------------------- Physician Orders Details Patient Name: Date of Service: Lauren [name], Lauren [name] 05/31/2022 2:00 PM Medical Record Number: [id number] Patient Account Number: [id number] Date of Birth/Sex: Treating RN: 04-15-1981 (42 y.o. 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 - Dr. Celine Ahr RM 1 Return A Anesthetic (In clinic) Topical Lidocaine 5% applied to wound bed Bathing/ Shower/ Hygiene May shower and wash wound with soap and water. Wound Treatment Wound #2 - Foot Wound Laterality: Plantar, Left Cleanser: Soap and Water 1 x Per Day/30 Days Discharge Instructions: May shower and wash wound with dial antibacterial soap and water prior to dressing change. Cleanser: Wound Cleanser 1 x Per Day/30 Days Discharge Instructions: Cleanse the wound with wound cleanser prior to applying a clean dressing using gauze sponges, not tissue or cotton balls. Peri-Wound Care: Skin Prep 1 x Per Day/30 Days Discharge Instructions: Use skin prep as directed Prim Dressing: Santyl Ointment 1 x Per Day/30 Days ary Discharge Instructions: Apply nickel thick amount to wound bed as instructed Secondary Dressing:  Zetuvit Plus Silicone Border Dressing 4x4 (in/in) 1 x Per Day/30 Days Discharge Instructions: Apply silicone border over primary dressing as directed. Patient Medications llergies: Wellbutrin, cefepime, ciprofloxacin, adhesive A Notifications Medication Indication Start End 05/31/2022 lidocaine DOSE topical 5 % ointment - ointment topical 05/31/2022 Santyl DOSE topical 250 unit/gram ointment - Apply nickel thick layer to foot wound as directed with daily dressing changes Electronic Signature(s) Signed: 05/31/2022 2:43:12 PM By: Fredirick Maudlin MD FACS Entered By: Fredirick Maudlin on 05/31/2022  14:43:12 -------------------------------------------------------------------------------- Problem List Details Patient Name: Date of Service: Lauren [name], Lauren [name] 05/31/2022 2:00 PM Medical Record Number: [id number] Patient Account Number: [id number] Date of Birth/Sex: Treating RN: 02-24-1981 (42 y.o. 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 05/31/2022 No Yes without evidence of necrosis E10.622 Type 1 diabetes mellitus with other skin ulcer 05/21/2022 No Yes N18.4 Chronic kidney disease, stage 4 (severe) 05/21/2022 No Yes I50.42 Chronic combined systolic (congestive) and diastolic (congestive) heart failure 05/21/2022 No Yes E66.9 Obesity, unspecified 05/21/2022 No Yes I63.9 Cerebral infarction, unspecified 05/21/2022 No Yes Inactive Problems Resolved Problems Electronic Signature(s) Signed: 05/31/2022 2:30:08 PM By: Fredirick Maudlin MD FACS Entered By: Fredirick Maudlin on 05/31/2022 14:30:08 -------------------------------------------------------------------------------- Progress Note Details Patient Name: Date of Service: Lauren [name], Lauren [name] 05/31/2022 2:00 PM Medical Record Number: [id number] Patient Account Number: [id number] Date of Birth/Sex: Treating RN: 1980/09/18 (42 y.o. 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 She has been on Lovenox injections and also wears cutaneous blood sugar monitor and insulin pump. Apparently about a month ago, she developed a small lump under the skin that subsequently ruptured. This is left a small ulcer in her left lower abdomen. Her primary care provider has had her applying Neosporin and Band-Aids. According to the patient and her daughter, there is really been no change or improvement since that time. 05/31/2022: The wound on her abdomen has a layer of eschar on it. Her podiatrist, [name], has also asked [name] to evaluate the wound on her foot. There is slough and eschar present. Apparently they had been using Iodosorb but recently changed to [facility]. Patient History Lauren [name], Lauren [name] ([id number]) [id number] Page 6 of 10 Information obtained from Patient, Caregiver, Chart. Family History Cancer - Paternal Grandparents, Diabetes - Child,Siblings, Heart Disease - Mother,Father,Maternal Grandparents,Paternal Grandparents, Hypertension - Mother,Father, Lung Disease - Mother,Siblings, Stroke - Mother, Thyroid Problems - Mother, No family history of Hereditary Spherocytosis, Kidney Disease, Seizures, Tuberculosis. Social History Former smoker - quit 01/2022, Marital Status - Married, Alcohol Use - Never, Drug Use - No History, Caffeine Use - Rarely. Medical History Eyes Denies history of Cataracts, Glaucoma, Optic Neuritis Ear/Nose/Mouth/Throat Denies history of Chronic sinus problems/congestion, Middle  ear  problems Hematologic/Lymphatic Patient has history of Anemia Cardiovascular Patient has history of Congestive Heart Failure, Coronary Artery Disease, Hypertension, Myocardial Infarction Denies history of Peripheral Arterial Disease Endocrine Patient has history of Type I Diabetes Genitourinary Denies history of End Stage Renal Disease Integumentary (Skin) Denies history of History of Burn Musculoskeletal Patient has history of Osteomyelitis - left foot Neurologic Patient has history of Neuropathy Oncologic Denies history of Received Chemotherapy, Received Radiation Psychiatric Denies history of Anorexia/bulimia, Confinement Anxiety Hospitalization/Surgery History - thrombectomy right brachial artery. - left 5th toe ray amputation. - cholecystectomy. - tubal ligation. - cardiac cath. Medical A Surgical History Notes nd Constitutional Symptoms (General Health) vitamin D deficiency Ear/Nose/Mouth/Throat frequent nose bleeds Respiratory hx pneumonia Cardiovascular hx thrombus right arm, right carotid artery occlusion, ischemic cardiomyopathy, hyperlipidemia Endocrine hypothyroidism Genitourinary CKD stage 4 Neurologic CVA, left hemiplegia Objective Constitutional Hypertensive, asymptomatic. no acute distress. Vitals Time Taken: 1:50 PM, Height: 65 in, Weight: 201 lbs, BMI: 33.4, Temperature: 97.7 F, Pulse: 86 bpm, Respiratory Rate: 18 breaths/min, Blood Pressure: 151/85 mmHg, Capillary Blood Glucose: 165 mg/dl. Respiratory Normal work of breathing on room air. General Notes: 05/31/2022: The wound on her abdomen has a layer of dry eschar on it. Her podiatrist, [name], has also asked [name] to evaluate the wound on her foot. There is slough and leathery eschar present. Integumentary (Hair, Skin) Wound #1 status is Healed - Epithelialized. Original cause of wound was Bump. The date acquired was: 04/02/2022. The wound has been in treatment 1 weeks. The wound is located on  the Left Abdomen - Lower Quadrant. The wound measures 0cm length x 0cm width x 0cm depth; 0cm^2 area and 0cm^3 volume. There is no tunneling or undermining noted. There is a none present amount of drainage noted. The wound margin is distinct with the outline attached to the wound base. There is no granulation within the wound bed. There is no necrotic tissue within the wound bed. The periwound skin appearance had no abnormalities noted for texture. The periwound skin appearance had no abnormalities noted for moisture. The periwound skin appearance had no abnormalities noted for color. Periwound temperature was noted as No Abnormality. Wound #2 status is Open. Original cause of wound was Gradually Appeared. The date acquired was: 03/03/2022. The wound is located on the [place]. The wound measures 1.8cm length x 2cm width x 0.1cm depth; 2.827cm^2 area and 0.283cm^3 volume. There is Fat Layer (Subcutaneous Tissue) exposed. There is no tunneling or undermining noted. There is a medium amount of serosanguineous drainage noted. The wound margin is distinct with the outline attached to the wound base. There is small (1-33%) red, pink granulation within the wound bed. There is a large (67-100%) amount of necrotic tissue within the [place], Theodora [name] ([id number]) [id number] Page 7 of 10 wound bed including Eschar and Adherent Slough. The periwound skin appearance had no abnormalities noted for texture. The periwound skin appearance had no abnormalities noted for color. The periwound skin appearance exhibited: Dry/Scaly. Periwound temperature was noted as No Abnormality. 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 There was a  Selective/Open Wound Non-Viable Tissue Debridement with a total area of 0.04 sq cm performed by Fredirick Maudlin, MD. With the following instrument(s): Curette to remove Non-Viable tissue/material. Material removed includes Eschar after achieving pain control using Lidocaine 5% topical ointment. No specimens were taken. A time out was conducted at 14:02, prior to the start of the procedure. A Minimum amount of bleeding was controlled with Pressure. The procedure was tolerated well. Post Debridement Measurements: 0.2cm length x 0.2cm width x 0.1cm depth; 0.003cm^3 volume. Character of Wound/Ulcer Post Debridement is improved. Post procedure Diagnosis Wound #1: Same as Pre-Procedure General Notes: scribed for Dr. Celine Ahr by Adline Peals, RN. Wound #2 Pre-procedure diagnosis of Wound #2 is a Diabetic Wound/Ulcer of the Lower Extremity located on the [place] .Severity of Tissue Pre Debridement is: Fat layer exposed. There was a Excisional Skin/Subcutaneous Tissue Debridement with a total area of 3.6 sq cm performed by Fredirick Maudlin, MD. With the following instrument(s): Curette to remove Non-Viable tissue/material. Material removed includes Eschar, Subcutaneous Tissue, and Slough after achieving pain control using Lidocaine 5% topical ointment. No specimens were taken. A time out was conducted at 14:02, prior to the start of the procedure. A Minimum amount of bleeding was controlled with Pressure. The procedure was tolerated well. Post Debridement Measurements: 1.8cm length x 2cm width x 0.1cm depth; 0.283cm^3 volume. Character of Wound/Ulcer Post Debridement is improved. Severity of Tissue Post Debridement is: Fat  layer exposed. Post procedure Diagnosis Wound #2: Same as Pre-Procedure General Notes: scribed for Dr. Celine Ahr by Adline Peals, RN. Plan Follow-up Appointments: Return Appointment in 1 week. - Dr. Celine Ahr RM 1 Anesthetic: (In clinic) Topical Lidocaine 5% applied to wound bed Bathing/ Shower/ Hygiene: May shower and wash wound with soap and water. The following medication(s) was prescribed: lidocaine topical 5 % ointment ointment topical was prescribed at facility Santyl topical 250 unit/gram ointment Apply nickel thick layer to foot wound as directed with daily dressing changes starting 05/31/2022 WOUND #2: - Foot Wound Laterality: Plantar, Left Cleanser: Soap and Water 1 x Per Day/30 Days Discharge Instructions: May shower and wash wound with dial antibacterial soap and water prior to dressing change. Cleanser: Wound Cleanser 1 x Per Day/30 Days Discharge Instructions: Cleanse the wound with wound cleanser prior to applying a clean dressing using gauze sponges, not tissue or cotton balls. Peri-Wound Care: Skin Prep 1 x Per Day/30 Days Discharge Instructions: Use skin prep as directed Prim Dressing: Santyl Ointment 1 x Per Day/30 Days ary Discharge Instructions: Apply nickel thick amount to wound bed as instructed Secondary Dressing: Zetuvit Plus Silicone Border Dressing 4x4 (in/in) 1 x Per Day/30 Days Discharge Instructions: Apply silicone border over primary dressing as directed. 05/31/2022: The wound on her abdomen has a layer of dry eschar on it. Her podiatrist, [name], has also asked [name] to evaluate the wound on her foot. There is slough and leathery eschar present. I used a curette to debride the eschar off of her abdomen. This revealed that the wound is healed. I used tissue scissors and forceps to excise leathery eschar from her foot. I then used a curette to debride slough and nonviable subcutaneous tissue from the site. This wound would benefit from additional enzymatic  debridement so I have prescribed Santyl to be applied daily. She may ultimately benefit from total contact casting, but in light of her stroke, this may be challenging. Follow-up in 1 week. Lauren [name], Lauren [name] ([id number]) [id number] Page 8 of 10 Electronic Signature(s) Signed: 05/31/2022 2:44:58 PM By: Celine Ahr,  Anderson Malta MD FACS Entered By: Fredirick Maudlin on 05/31/2022 14:44:57 -------------------------------------------------------------------------------- HxROS Details Patient Name: Date of Service: Lauren [name], Lauren [name] 05/31/2022 2:00 PM Medical Record Number: [id number] Patient Account Number: [id number] Date of Birth/Sex: Treating RN: 09/13/1980 (42 y.o. F) Primary Care Provider: Marland Kitchen Other Clinician: Referring Provider: Treating Provider/Extender: [name] in Treatment: 1 Information Obtained From Patient Caregiver Chart Constitutional Symptoms (General Health) Medical History: Past Medical History Notes: vitamin D deficiency Eyes Medical History: Negative for: Cataracts; Glaucoma; Optic Neuritis Ear/Nose/Mouth/Throat Medical History: Negative for: Chronic sinus problems/congestion; Middle ear problems Past Medical History Notes: frequent nose bleeds Hematologic/Lymphatic Medical History: Positive for: Anemia Respiratory Medical History: Past Medical History Notes: hx pneumonia Cardiovascular Medical History: Positive for: Congestive Heart Failure; Coronary Artery Disease; Hypertension; Myocardial Infarction Negative for: Peripheral Arterial Disease Past Medical History Notes: hx thrombus right arm, right carotid artery occlusion, ischemic cardiomyopathy, hyperlipidemia Endocrine Medical History: Positive for: Type I Diabetes Past Medical History Notes: hypothyroidism Time with diabetes: 17 yrs Treated with: Insulin Blood sugar tested every day: Yes Tested : has dexcom Genitourinary Medical  History: Negative for: End Stage Renal Disease Past Medical History Notes: CKD stage 4 Lauren [name], Lauren [name] ([id number]) [phone].pdf Page 9 of 10 Integumentary (Skin) Medical History: Negative for: History of Burn Musculoskeletal Medical History: Positive for: Osteomyelitis - left foot Neurologic Medical History: Positive for: Neuropathy Past Medical History Notes: CVA, left hemiplegia Oncologic Medical History: Negative for: Received Chemotherapy; Received Radiation Psychiatric Medical History: Negative for: Anorexia/bulimia; Confinement Anxiety Immunizations Pneumococcal Vaccine: Received Pneumococcal Vaccination: No Implantable Devices None Hospitalization / Surgery History Type of Hospitalization/Surgery thrombectomy right brachial artery left 5th toe ray amputation cholecystectomy tubal ligation cardiac cath Family and Social History Cancer: Yes - Paternal Grandparents; Diabetes: Yes - Child,Siblings; Heart Disease: Yes - Mother,Father,Maternal Grandparents,Paternal Grandparents; Hereditary Spherocytosis: No; Hypertension: Yes - Mother,Father; Kidney Disease: No; Lung Disease: Yes - Mother,Siblings; Seizures: No; Stroke: Yes - Mother; Thyroid Problems: Yes - Mother; Tuberculosis: No; Former smoker - quit 01/2022; Marital Status - Married; Alcohol Use: Never; Drug Use: No History; Caffeine Use: Rarely; Financial Concerns: No; Food, Clothing or Shelter Needs: No; Support System Lacking: No; Transportation Concerns: No Electronic Signature(s) Signed: 05/31/2022 4:07:33 PM By: Fredirick Maudlin MD FACS Entered By: Fredirick Maudlin on 05/31/2022 14:31:21 -------------------------------------------------------------------------------- SuperBill Details Patient Name: Date of Service: ALEJANDRIA, WESSELLS 05/31/2022 Medical Record Number: [id number] Patient Account Number: [id number] Date of Birth/Sex: Treating RN: [birth year] (42 y.o. 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 E10.622 Type 1 diabetes mellitus with other skin ulcer N18.4 Chronic kidney disease, stage 4 (severe) I50.42 Chronic combined systolic (congestive) and diastolic (congestive) heart failure E66.9 Obesity, unspecified I63.9 Cerebral infarction, unspecified Facility Procedures : CPT4 Code: 35573220 Description: 25427 - DEB SUBQ TISSUE 20 SQ CM/< ICD-10 Diagnosis Description E10.622 Type 1 diabetes mellitus with other skin ulcer Modifier: Quantity: 1 : CPT4 Code: 06237628 Description: 31517 - DEBRIDE WOUND 1ST 20 SQ CM OR < ICD-10 Diagnosis Description L98.499 Non-pressure chronic ulcer of skin of other sites with unspecified severity Modifier: Quantity: 1 Physician Procedures : CPT4 Code Description Modifier [id number] 10626 - WC PHYS LEVEL 4 - EST PT 25 ICD-10 Diagnosis Description L98.499 Non-pressure chronic  ulcer of skin of other sites with unspecified severity L97.425 Non-pressure chronic ulcer of left heel and midfoot with  muscle involvement without evidence of nec 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

## 2022-05-31 NOTE — Progress Notes (Addendum)
Subjective:    Patient ID: Lauren [name], female    DOB: October 09, 1980, 42 y.o.   MRN: [id number]  [facility] Summary 10/19   Brief HPI:   Lauren [name] is a 42 y.o. female noted to the emergency room on 05/10/2021 with unsteady gait, left-sided facial droop and left arm weakness with slurred speech.  Code stroke called.  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  She underwent thrombectomy with TICI 3.  Cardiology consulted and recent echo revealed decrease in EF to approximately 30 to 35%.  She was noted to have elevated troponin with unremarkable EKG and was treated with nitroglycerin and heparin infusion.  ID was consulted for input due to history of left foot osteomyelitis as well as podiatry.  She underwent bone biopsy of fourth metatarsal left foot and broad-spectrum antibiotics were adjusted.  She did receive 1 unit PRBCs due to drop in H&H.  Hypercoagulable work-up was negative and she was started on Lovenox per hematology input.  Antibiotics were discontinued as cultures were negative.  Diet was advanced to dysphagia 3, leukocytosis was resolving with improvement in renal status.  PT/OT /ST was working with patient who continued to be limited by weakness as well as cognitive deficits.  CIR was recommended due to functional decline.     Hospital Course: Lauren [name] was admitted to rehab 01/28/2022 for inpatient therapies to consist of PT, ST and OT at least three hours five days a week. Past admission physiatrist, therapy team and rehab RN have worked together to provide customized collaborative inpatient rehab. Diet advanced to regular textures. Seen by podiatry 9/30 and left films obtained. Some dehiscence of incision. New wound care orders placed.  Lauren [name] saw the patient on 10/3 and allowed the patient to wear post-op shoe or CAM boot to WBAT.    Rapid response called at 1147 on 10/4 due to chest pain. NTG given and  EKG times 2 performed. No evidence of STE. Chest xray c/w volume overload and mild pulmonary edema. Cardiology resident consulted. Lasix 40 mg PO given. Creatinine bump to 2.26. Symptoms improved. BP overnight 110s-120s.  Lasix 10 mg PO daily started on 10/5. Also, hemoglobin drift to 7.0 although on recheck was 7.8. Transfused one unit PRBCs. Check FOBT and restart home Protonix. Creatinine slightly lower at 2.12. Fecal occult stool testing negative times 3 samples and follow up CBC showed H/H to be stable on DAPT as well as Lovenox for coagulopathy.  Dose has been  adjusted due to CKD.  She has had issues with left shoulder pain and sling was ordered for use as needed.  Tramadol has also been used on as needed basis for pain management.   Sudden onset of chest pain again on 10/9. NTG times 3 doses given without relief. Rapid response called. Stat EKG, troponin and CK ordered. Chest x-ray ok and trop below her baseline. Trazodone increased to 75 mg at bedtime for sleep. Increased to 100 mg on 10/12. Wanted to try something else for sleep so started on tizanidine 4 mg q HS.  Hemoglobin trending upward and 9.3 on 10/16. Lasix decreased to 20 mg daily on 10/16 due to elevation in serum creatinine. Consulted medicine service on 10/18 for assistance with kidney function, fluid balance and glucose control.  Strict I's and O's were recommended as patient did not appear to to be fluid overloaded.     Blood pressures were monitored on TID basis  and she was hypotensive on 10/2 without complaints. Midday hydralazine held. Cardiology consulted due to low EF and multiple meds. At admission, she is receiving hydralazine 12.5 mg TID, isosorbide 20 mg BID, Lopressor 37.5 mg BID but was complicated by multiple episodes of orthostatic hypotension with presyncope and cards was consulted multiple times to help with adjustment of medications. Compression with ACE wrap LE and TED hose RLE when OOB also used for BP support. Lauren [name]  felt that there was nothing further to add for patient's symptoms and recommended home with hospice. As BP stabilized, Lasix 40 mg was started for vascular congestion seen on CXR 10/5. Lopressor increase back to 37.5 mg BID.   Diabetes has been monitored with ac/hs CBG checks and SSI was use prn for tighter BS control. Levemir 25 units continued and Novolog 2 units TID with meals. Diabetic coordinator consulted for assistance with diet. Increased meal coverage to 5 units and added nighttime SSI.  Multiple changes in insulin management were made throughout her stay.  Her blood sugars continue to be poorly controlled as patient was not willing to comply with dietary restrictions during her stay.  Internal medicine recommended that patient's insulin pump be resumed as patient reported that this worked better for her.   Diabetic coordinator was consulted who reviewed endocrinology notes and pump was resumed per home regimen.  Both patient and family have been instructed on importance of diabetic management as well as dietary restrictions to provide fluid overload as well as better blood sugar control.  They are by her of need to contact endocrinology for further adjustment in her insulin protocol.  Patient has made progress during his stay but continues to be limited by left inattention, decrease in attention, problems with recall as well as overall safety awareness.  She will continue to receive further follow-up home health PT, OT, OT and RN by [facility] home health after discharge.   .   Rehab course: During patient's stay in rehab weekly team conferences were held to monitor patient's progress, set goals and discuss barriers to discharge. At admission, patient required X assist with basic self-care skills, moderate to total assist for mobility.   She has had improvement in activity tolerance, balance, postural control as well as ability to compensate for deficits. She has had improvement in functional use LUE   and LLE as well as improvement in awareness.  She is able to complete ADL tasks with min assist.  She requires supervision for transfers and min assist to ambulate 30'with RW and left hand splint.  She requires min to mod assist with verbal and visual cues to complete functional and familiar tasks safely.  She is able to utilize compensatory swallow strategies at modified independent level.  Patient's safety is also intermittently impacted by mobility.  Family has been instructed on 24 hours supervision for safety. Family education has been completed regarding all aspects of care.        Interval history 04/19/2022 Ms. Jasmine Pang reports that she has been doing well overall since her discharge from CIR.  She has completed her home therapy.  She is walking much better  She sometimes needs a little assistance with dressing however is able to do it mostly on her own.  She reports she was seen by her PCP for follow-up.  She is also following with hematology.  She feels like her strength has improved significantly since her discharge.  She continues to have shoulder pain and uses tramadol ordered by her  PCP about once a day.  She reports her left leg is healing well and she is following with her surgeon for this.  She denies any issues with bowel or bladder control.  Interval history 05/31/22 Ms. Jasmine Pang is here for follow-up after her CVA.  She reports she continues to have pain in her left foot due to a foot ulcer.  She is following with wound care and [name] podiatry for this issue.  She is also having severe pain in her left shoulder that is worsened with movement.  She reports that Dr. Allena Earing from neurology was considering her to be referred to PT for evaluation for possible vagal nerve stimulation treatment.  She continues to have spasticity in her left hand and wrist.  She is using oxycodone 5 mg as needed only about once a day for her shoulder and foot pain.  Pain Inventory Average Pain 5 Pain Right  Now 9 My pain is intermittent, constant, and aching  LOCATION OF PAIN  Shoulder, wrist, hand  BOWEL Number of stools per week: 2  BLADDER Normal  Frequent urination Yes    Mobility how many minutes can you walk? 3-4 ability to climb steps?  yes do you drive?  no use a wheelchair needs help with transfers Do you have any goals in this area?  yes  Function disabled: date disabled applied I need assistance with the following:  dressing, bathing, toileting, meal prep, household duties, and shopping Do you have any goals in this area?  yes  Neuro/Psych trouble walking  Prior Studies Any changes since last visit?  no  Physicians involved in your care Any changes since last visit?  no   Family History  Problem Relation Age of Onset   Thyroid disease Mother    Hypertension Mother    Hyperlipidemia Mother    CAD Mother    CVA Mother    Hyperlipidemia Father    Hypertension Father    CAD Father    Breast cancer Paternal Grandmother 57   Cancer Paternal Grandmother        lung   Social History   Socioeconomic History   Marital status: Married    Spouse name: Jerelyn Scott   Number of children: 2   Years of education: 12   Highest education level: 12th grade  Occupational History    Comment: Full time   Occupation: CNA at [employer] Use   Smoking status: Former    Packs/day: 0.25    Types: Cigarettes    Quit date: 10/27/2020    Years since quitting: 1.5    Passive exposure: Past   Smokeless tobacco: Never  Vaping Use   Vaping Use: Never used  Substance and Sexual Activity   Alcohol use: No    Comment: occ   Drug use: No   Sexual activity: Yes    Birth control/protection: Surgical    Comment: tubal   Other Topics Concern   Not on file  Social History Narrative   Lives with husband and kids   Right handed   Drinks 9+ cups caffeine daily   Social Determinants of Health   Financial Resource Strain: Low Risk  (03/03/2022)   Overall Financial  Resource Strain (CARDIA)    Difficulty of Paying Living Expenses: Not very hard  Recent Concern: Financial Resource Strain - High Risk (01/14/2022)   Overall Financial Resource Strain (CARDIA)    Difficulty of Paying Living Expenses: Hard  Food Insecurity: No Food Insecurity (03/03/2022)  Hunger Vital Sign    Worried About Running Out of Food in the Last Year: Never true    Ran Out of Food in the Last Year: Never true  Recent Concern: Food Insecurity - Food Insecurity Present (01/16/2022)   Hunger Vital Sign    Worried About Running Out of Food in the Last Year: Sometimes true    Ran Out of Food in the Last Year: Sometimes true  Transportation Needs: No Transportation Needs (03/03/2022)   PRAPARE - Hydrologist (Medical): No    Lack of Transportation (Non-Medical): No  Physical Activity: Inactive (03/03/2022)   Exercise Vital Sign    Days of Exercise per Week: 0 days    Minutes of Exercise per Session: 0 min  Stress: No Stress Concern Present (03/03/2022)   Sun    Feeling of Stress : Only a little  Recent Concern: Stress - Stress Concern Present (12/22/2021)   Fults    Feeling of Stress : To some extent  Social Connections: Socially Integrated (03/03/2022)   Social Connection and Isolation Panel [NHANES]    Frequency of Communication with Friends and Family: More than three times a week    Frequency of Social Gatherings with Friends and Family: More than three times a week    Attends Religious Services: More than 4 times per year    Active Member of Clubs or Organizations: Yes    Attends Archivist Meetings: More than 4 times per year    Marital Status: Married   Past Surgical History:  Procedure Laterality Date   AMPUTATION Left 09/02/2021   Procedure: AMPUTATION RAY;  Surgeon: Criselda Peaches, DPM;   Location: [place];  Service: Podiatry;  Laterality: Left;  sagittal saw, 3L bag saline & Pulse   Cardiac catherization     CHOLECYSTECTOMY     I & D EXTREMITY Left 08/30/2021   Procedure: IRRIGATION AND DEBRIDEMENT WITH BONE BIOPSY;  Surgeon: [name], DPM;  Location: [place];  Service: Podiatry;  Laterality: Left;   IR ANGIO VERTEBRAL SEL SUBCLAVIAN INNOMINATE UNI L MOD SED  01/18/2022   IR CT HEAD LTD  01/08/2022   IR INTRA CRAN STENT  01/08/2022   IR PERCUTANEOUS ART THROMBECTOMY/INFUSION INTRACRANIAL INC DIAG ANGIO  01/08/2022   IRRIGATION AND DEBRIDEMENT FOOT Left 09/04/2021   Procedure: IRRIGATION AND DEBRIDEMENT FOOT AND CLOSURE;  Surgeon: Criselda Peaches, DPM;  Location: [place];  Service: Podiatry;  Laterality: Left;   RADIOLOGY WITH ANESTHESIA N/A 01/08/2022   Procedure: IR WITH ANESTHESIA;  Surgeon: Radiologist, Medication, MD;  Location: [place];  Service: Radiology;  Laterality: N/A;   THROMBECTOMY BRACHIAL ARTERY Right 11/18/2021   Procedure: RIGHT BRACHIAL, RADIAL, & ULNAR ARTERY THROMBECTOMY.;  Surgeon: Marty Heck, MD;  Location: [place];  Service: Vascular;  Laterality: Right;   TUBAL LIGATION     Past Medical History:  Diagnosis Date   Anemia    CAD (coronary artery disease)    a. s/p cath in 03/2014 showing 30% mid-LAD, moderate to severe disease along small D1, patent LCx, moderate to severe distal OM2 stenosis and moderate diffuse diease along RCA not amenable to PCI   CHF (congestive heart failure) ([place])    a. EF 55-60% in 12/2019 b. EF at 35-40% by echo in 05/2020   CKD (chronic kidney disease) stage 4, GFR 15-29 ml/min ([place])  Diabetes mellitus without complication (HCC)    Myocardial infarction ([place])    Neuropathy    Stroke ([place])    Ht 5' 5"$  (1.651 m)   Wt 213 lb (96.6 kg)   LMP 04/28/2022   BMI 35.45 kg/m   Opioid Risk Score:   Fall Risk Score:  `1  Depression screen [facility] 2/9     05/31/2022    3:01 PM 04/20/2022   11:41 AM 04/19/2022    2:32 PM  04/05/2022   11:05 AM 03/03/2022    1:35 PM 03/03/2022   10:08 AM 12/22/2021    6:14 PM  Depression screen PHQ 2/9  Decreased Interest 1 0 0 0 0 0 0  Down, Depressed, Hopeless 1 0 0 0 0 0 1  PHQ - 2 Score 2 0 0 0 0 0 1  Altered sleeping  0  2 0    Tired, decreased energy  0  0 0    Change in appetite  0  0 0    Feeling bad or failure about yourself   0  0 0    Trouble concentrating  0  1 0    Moving slowly or fidgety/restless  0  0 0    Suicidal thoughts  0  0 0    PHQ-9 Score  0  3 0      Review of Systems  Genitourinary:        Frequent void  Musculoskeletal:  Positive for gait problem.  All other systems reviewed and are negative.      Objective:   Physical Exam     05/31/2022    2:58 PM 05/10/2022    2:40 PM 05/08/2022   11:00 PM  Vitals with BMI  Height 5' 5"$  5' 5"$    Weight 213 lbs 209 lbs 6 oz   BMI [id number]   Systolic [id number]   Diastolic 85 85   Pulse 83 83 87    Gen: no distress, normal appearing HEENT: oral mucosa pink and moist, NCAT Cardio: Reg rate Chest: normal effort, normal rate of breathing Abd: soft, non-distended Ext: Mild edema in her left upper extremity Psych: pleasant, normal affect Skin: intact Neuro: Alert and awake, makes eye contact, follows commands, using post-op shoe on the left, left facial weakness Strength 5 out of 5 right upper extremity and right lower extremity Strength 1-2 out of 5 left shoulder abduction and elbow extension, elbow flexion 2-3 out of 5, finger flexion 4 out of 5, she has difficulty extending the fingers Strength left lower extremity 4 out of 5 throughout -Sensation intact light touch in all 4 extremities Musculoskeletal: Subluxation left shoulder Increased tone in finger flexors MAS 2-3, elbow flexors and pronators 2, her tone decreases with stretching              Assessment & Plan:   1. Right MCA infarct status post thrombectomy, right ICA dissection with stent placement.             -Referral to  outpatient PT to work on her left upper and lower extremity function   -Appears that neurology is considering her for possible vagal nerve stimulation treatment   2 shoulder subluxation left  -Continue Voltaren gel, will schedule for shoulder injection -Patient is using occasional oxycodone from her foot pain however it is also helping her shoulder.  Think it is reasonable to continue rare use of oxycodone for both the sources of pain until they can attempt shoulder injection.  She recently received 20 tabs from her podiatrist of oxycodone 5.  She reports tramadol did not help her pain.   3 Spasticity left upper extremity.              -Continue to monitor may need Botox at a later time.  She says she will consider this   4. Hypertension             -Well-controlled continue follow-up with PCP   5: Left foot osteomyelitis s/p ray amputation  -Continue follow-up with Lauren [name], she is currently being treated for a left heel diabetic ulcer  2/13 called pt, she continues to have severe shoulder pain, occasional oxycodone helping when it is severe, she is almost out of the medication, will reorder short term oxycodone 87m q6h prn, #20

## 2022-06-01 ENCOUNTER — Ambulatory Visit (INDEPENDENT_AMBULATORY_CARE_PROVIDER_SITE_OTHER): Payer: BC Managed Care – PPO | Admitting: Internal Medicine

## 2022-06-01 ENCOUNTER — Other Ambulatory Visit: Payer: Self-pay

## 2022-06-01 ENCOUNTER — Encounter: Payer: Self-pay | Admitting: Internal Medicine

## 2022-06-01 ENCOUNTER — Encounter: Payer: Self-pay | Admitting: Vascular Surgery

## 2022-06-01 ENCOUNTER — Ambulatory Visit (INDEPENDENT_AMBULATORY_CARE_PROVIDER_SITE_OTHER): Payer: BC Managed Care – PPO | Admitting: Vascular Surgery

## 2022-06-01 VITALS — BP 121/78 | HR 80 | Ht 65.0 in | Wt 215.0 lb

## 2022-06-01 VITALS — BP 132/81 | HR 80 | Temp 97.2°F | Resp 16 | Ht 65.0 in | Wt 215.0 lb

## 2022-06-01 DIAGNOSIS — E559 Vitamin D deficiency, unspecified: Secondary | ICD-10-CM | POA: Diagnosis not present

## 2022-06-01 DIAGNOSIS — E039 Hypothyroidism, unspecified: Secondary | ICD-10-CM

## 2022-06-01 DIAGNOSIS — H3530 Unspecified macular degeneration: Secondary | ICD-10-CM | POA: Diagnosis not present

## 2022-06-01 DIAGNOSIS — I70222 Atherosclerosis of native arteries of extremities with rest pain, left leg: Secondary | ICD-10-CM

## 2022-06-01 DIAGNOSIS — E1059 Type 1 diabetes mellitus with other circulatory complications: Secondary | ICD-10-CM

## 2022-06-01 DIAGNOSIS — E1022 Type 1 diabetes mellitus with diabetic chronic kidney disease: Secondary | ICD-10-CM

## 2022-06-01 DIAGNOSIS — I10 Essential (primary) hypertension: Secondary | ICD-10-CM

## 2022-06-01 DIAGNOSIS — I639 Cerebral infarction, unspecified: Secondary | ICD-10-CM | POA: Diagnosis not present

## 2022-06-01 DIAGNOSIS — N184 Chronic kidney disease, stage 4 (severe): Secondary | ICD-10-CM

## 2022-06-01 DIAGNOSIS — I2510 Atherosclerotic heart disease of native coronary artery without angina pectoris: Secondary | ICD-10-CM

## 2022-06-01 MED ORDER — ISOSORBIDE MONONITRATE 20 MG PO TABS: 20.0000 mg | ORAL_TABLET | Freq: Two times a day (BID) | ORAL | 1 refills | Status: DC

## 2022-06-01 MED ORDER — PANTOPRAZOLE SODIUM 40 MG PO TBEC: 40.0000 mg | DELAYED_RELEASE_TABLET | Freq: Every day | ORAL | 1 refills | Status: DC

## 2022-06-01 MED ORDER — ATORVASTATIN CALCIUM 40 MG PO TABS: 40.0000 mg | ORAL_TABLET | Freq: Every day | ORAL | 1 refills | Status: DC

## 2022-06-01 MED ORDER — RANOLAZINE ER 500 MG PO TB12: 500.0000 mg | ORAL_TABLET | Freq: Two times a day (BID) | ORAL | 2 refills | Status: DC

## 2022-06-01 MED ORDER — LEVOTHYROXINE SODIUM 125 MCG PO TABS: 125.0000 ug | ORAL_TABLET | Freq: Every day | ORAL | 2 refills | Status: DC

## 2022-06-01 MED ORDER — TIZANIDINE HCL 4 MG PO TABS: 4.0000 mg | ORAL_TABLET | Freq: Every day | ORAL | 2 refills | Status: DC

## 2022-06-01 NOTE — Progress Notes (Signed)
Patient name: Lauren [name] MRN: [id number] DOB: 10-01-1980 Sex: female  REASON FOR VISIT: Evaluate nonhealing left foot wound  HPI: Lauren [name] is a 42 y.o. female with chronic systolic heart failure with an EF of 35 to 40%, coronary artery disease, chronic kidney disease, hypertension, diabetes, hyperlipidemia that presents for evaluation of nonhealing left foot wound from podiatry.  Patient states this has been present for several months.  She just started going to the wound clinic yesterday.  ABIs on 01/14/2022 were 0.81 on the left monophasic.    She is well-known to our practice and previously presented with acutely ischemic right upper extremity requiring a right brachial, radial and ulnar artery thrombectomy on 11/18/2021.  She was discharged on Eliquis and is currently on Coumadin followed by Dr. Harl Bowie with cardiology and also in the Coumadin clinic at [facility].  Past Medical History:  Diagnosis Date   Anemia    CAD (coronary artery disease)    a. s/p cath in 03/2014 showing 30% mid-LAD, moderate to severe disease along small D1, patent LCx, moderate to severe distal OM2 stenosis and moderate diffuse diease along RCA not amenable to PCI   CHF (congestive heart failure) ([place])    a. EF 55-60% in 12/2019 b. EF at 35-40% by echo in 05/2020   CKD (chronic kidney disease) stage 4, GFR 15-29 ml/min (HCC)    Diabetes mellitus without complication ([place])    Myocardial infarction ([place])    Neuropathy    Stroke [facility])     Past Surgical History:  Procedure Laterality Date   AMPUTATION Left 09/02/2021   Procedure: AMPUTATION RAY;  Surgeon: Criselda Peaches, DPM;  Location: [place];  Service: Podiatry;  Laterality: Left;  sagittal saw, 3L bag saline & Pulse   Cardiac catherization     CHOLECYSTECTOMY     I & D EXTREMITY Left 08/30/2021   Procedure: IRRIGATION AND DEBRIDEMENT WITH BONE BIOPSY;  Surgeon: [name], DPM;  Location: [place];  Service: Podiatry;  Laterality: Left;   IR ANGIO  VERTEBRAL SEL SUBCLAVIAN INNOMINATE UNI L MOD SED  01/18/2022   IR CT HEAD LTD  01/08/2022   IR INTRA CRAN STENT  01/08/2022   IR PERCUTANEOUS ART THROMBECTOMY/INFUSION INTRACRANIAL INC DIAG ANGIO  01/08/2022   IRRIGATION AND DEBRIDEMENT FOOT Left 09/04/2021   Procedure: IRRIGATION AND DEBRIDEMENT FOOT AND CLOSURE;  Surgeon: Criselda Peaches, DPM;  Location: [place];  Service: Podiatry;  Laterality: Left;   RADIOLOGY WITH ANESTHESIA N/A 01/08/2022   Procedure: IR WITH ANESTHESIA;  Surgeon: Radiologist, Medication, MD;  Location: [place];  Service: Radiology;  Laterality: N/A;   THROMBECTOMY BRACHIAL ARTERY Right 11/18/2021   Procedure: RIGHT BRACHIAL, RADIAL, & ULNAR ARTERY THROMBECTOMY.;  Surgeon: Marty Heck, MD;  [facility];  Service: Vascular;  Laterality: Right;   TUBAL LIGATION      Family History  Problem Relation Age of Onset   Thyroid disease Mother    Hypertension Mother    Hyperlipidemia Mother    CAD Mother    CVA Mother    Hyperlipidemia Father    Hypertension Father    CAD Father    Breast cancer Paternal Grandmother 63   Cancer Paternal Grandmother        lung    SOCIAL HISTORY: Social History   Tobacco Use   Smoking status: Former    Packs/day: 0.25    Types: Cigarettes    Quit date: 10/27/2020    Years since quitting: 1.5  Passive exposure: Past   Smokeless tobacco: Never  Substance Use Topics   Alcohol use: No    Comment: occ    Allergies  Allergen Reactions   Wellbutrin [Bupropion] Hives   Cefepime Rash    Tolerates penicilllin   Ciprofloxacin Hcl Hives and Rash    Hives/rash at injection site; 01/15/22 tolerated IV cipro   Tape Rash    Current Outpatient Medications  Medication Sig Dispense Refill   acetaminophen (TYLENOL) 325 MG tablet Take 1-2 tablets (325-650 mg total) by mouth every 4 (four) hours as needed for mild pain. 100 tablet 0   amoxicillin-clavulanate (AUGMENTIN) 875-125 MG tablet Take 1 tablet by mouth 2 (two) times daily. 20  tablet 0   atorvastatin (LIPITOR) 40 MG tablet Take 1 tablet (40 mg total) by mouth daily. Courtesy fill/ pt to get established with a primary MD for further refills 30 tablet 0   clopidogrel (PLAVIX) 75 MG tablet Take 1 tablet (75 mg total) by mouth daily. 30 tablet 11   Continuous Blood Gluc Sensor (DEXCOM G6 SENSOR) MISC Change sensor every 10 days as directed 9 each 3   diclofenac Sodium (VOLTAREN ARTHRITIS PAIN) 1 % GEL Apply 2 g topically 4 (four) times daily. 150 g 5   furosemide (LASIX) 40 MG tablet Take 40 mg daily as needed for swelling 90 tablet 3   hydrALAZINE (APRESOLINE) 25 MG tablet Take 0.5 tablets (12.5 mg total) by mouth 2 (two) times daily. 30 tablet 6   insulin aspart (NOVOLOG) 100 UNIT/ML injection Use with Omnipod for daily dose around 60 units daily 40 mL 3   Insulin Disposable Pump (OMNIPOD 5 G6 POD, GEN 5,) MISC Change pod every 48-72 hours 6 each 3   isosorbide mononitrate (ISMO) 20 MG tablet Take 1 tablet (20 mg total) by mouth 2 (two) times daily. Courtesy fill/ pt to get established with a primary MD for further refills 60 tablet 0   levothyroxine (SYNTHROID) 125 MCG tablet Take 1 tablet (125 mcg total) by mouth daily at 6 (six) AM. Courtesy fill/ pt to get established with a primary MD for further refills 30 tablet 0   magnesium oxide (MAG-OX) 400 MG tablet Take 0.5 tablets (200 mg total) by mouth at bedtime. 15 tablet 0   methocarbamol (ROBAXIN) 500 MG tablet Take 1 tablet (500 mg total) by mouth every 6 (six) hours as needed for muscle spasms. 60 tablet 0   metoprolol succinate (TOPROL XL) 50 MG 24 hr tablet Take 1 tablet (50 mg total) by mouth daily. 30 tablet 6   Multiple Vitamin (MULTIVITAMIN WITH MINERALS) TABS tablet Take 1 tablet by mouth daily.     nicotine (NICODERM CQ - DOSED IN MG/24 HOURS) 21 mg/24hr patch Place 1 patch (21 mg total) onto the skin daily. 28 patch 0   ondansetron (ZOFRAN) 4 MG tablet Take 1 tablet (4 mg total) by mouth every 8 (eight) hours  as needed for nausea or vomiting. 90 tablet 0   oxyCODONE (OXY IR/ROXICODONE) 5 MG immediate release tablet Take 1 tablet (5 mg total) by mouth every 4 (four) hours as needed for up to 7 days for severe pain. 20 tablet 0   pantoprazole (PROTONIX) 40 MG tablet Take 1 tablet (40 mg total) by mouth daily. Courtesy fill/ pt to get established with a primary MD for further refills 30 tablet 0   polyethylene glycol (MIRALAX / GLYCOLAX) 17 g packet Take 17 g by mouth 2 (two) times daily. 60 each 0  ramelteon (ROZEREM) 8 MG tablet Take 1 tablet (8 mg total) by mouth at bedtime. 30 tablet 0   ranolazine (RANEXA) 500 MG 12 hr tablet Take 1 tablet (500 mg total) by mouth 2 (two) times daily. Courtesy fill/ pt to get established with a primary MD for further refills 60 tablet 0   tiZANidine (ZANAFLEX) 4 MG tablet Take 1 tablet (4 mg total) by mouth at bedtime. Courtesy fill/ pt to get established with a primary MD for further refills 30 tablet 0   traZODone (DESYREL) 50 MG tablet Take 1 tablet (50 mg total) by mouth at bedtime. 30 tablet 0   Vitamin D, Ergocalciferol, (DRISDOL) 1.25 MG (50000 UNIT) CAPS capsule Take 1 capsule (50,000 Units total) by mouth every 7 (seven) days for 8 doses. 8 capsule 0   warfarin (COUMADIN) 5 MG tablet Take 1 tablet daily except 1 1/2 tablets on Wednesdays or as directed. 40 tablet 5   enoxaparin (LOVENOX) 120 MG/0.8ML injection Inject 0.8 mLs (120 mg total) into the skin daily. (Patient not taking: Reported on 05/31/2022) 24 mL 6   nitroGLYCERIN (NITROSTAT) 0.4 MG SL tablet Place 1 tablet (0.4 mg total) under the tongue every 5 (five) minutes x 3 doses as needed for chest pain. (Patient not taking: Reported on 06/01/2022) 25 tablet 3   No current facility-administered medications for this visit.    REVIEW OF SYSTEMS:  [X]  denotes positive finding, [ ]  denotes negative finding Cardiac  Comments:  Chest pain or chest pressure:    Shortness of breath upon exertion:    Short of  breath when lying flat:    Irregular heart rhythm:        Vascular    Pain in calf, thigh, or hip brought on by ambulation:    Pain in feet at night that wakes you up from your sleep:     Blood clot in your veins:    Leg swelling:         Pulmonary    Oxygen at home:    Productive cough:     Wheezing:         Neurologic    Sudden weakness in arms or legs:     Sudden numbness in arms or legs:     Sudden onset of difficulty speaking or slurred speech:    Temporary loss of vision in one eye:     Problems with dizziness:         Gastrointestinal    Blood in stool:     Vomited blood:         Genitourinary    Burning when urinating:     Blood in urine:        Psychiatric    Major depression:         Hematologic    Bleeding problems:    Problems with blood clotting too easily:        Skin    Rashes or ulcers:        Constitutional    Fever or chills:      PHYSICAL EXAM: Vitals:   06/01/22 0829  BP: 132/81  Pulse: 80  Resp: 16  Temp: (!) 97.2 F (36.2 C)  TempSrc: Temporal  SpO2: 98%  Weight: 215 lb (97.5 kg)  Height: 5\' 5"  (1.651 m)    GENERAL: The patient is a well-nourished female, in no acute distress. The vital signs are documented above. CARDIAC: There is a regular rate and rhythm.  VASCULAR:  Bilateral  femoral pulses 1+ palpable No palpable left pedal pulses Left foot wound is pictured PULMONARY: No respiratory distress ABDOMEN: Soft and non-tender. MUSCULOSKELETAL: There are no major deformities or cyanosis. NEUROLOGIC: No focal weakness or paresthesias are detected. PSYCHIATRIC: The patient has a normal affect.    DATA:   ABIs on 01/14/2022 were 0.81 on the left monophasic.    Assessment/Plan:  42 y.o. female with chronic systolic heart failure with an EF of 35 to 40%, coronary artery disease, chronic kidney disease, hypertension, diabetes, hyperlipidemia that presents for evaluation of nonhealing left foot wound from podiatry.  I have  recommended aortogram with lower extremity arteriogram with a focus on the left leg with possible intervention.  Her previous ABIs were 0.81 with an abnormal dampened monophasic waveform at the ankle.  I cannot palpate any pedal pulses on exam.  Discussed indication is to try and improve her inflow for wound healing.  She will need to hold her Coumadin as I discussed with her today.  I discussed the option of Lovenox bridge and he does not want to do this as she has had previous abdominal wounds from her Lovenox shots.  Will get scheduled for next Thursday in the Cath Lab.  Risks benefits discussed including risk of bleeding and vessel injury.  Discussed we may have to do some of this case with CO2 given her chronic kidney disease.   Marty Heck, MD [facility] Office: [phone]

## 2022-06-01 NOTE — Progress Notes (Signed)
Established Patient Office Visit  Subjective   Patient ID: Lauren [name], female    DOB: 1980/08/02  Age: 42 y.o. MRN: [id number]  Chief Complaint  Patient presents with   Diabetes    Follow up   Lauren [name] returns to care today.  She was last seen by me on 04/20/22 at which time she expressed an interest in transitioning from Lovenox to warfarin.  This was coordinated with hematology and cardiology coumadin clinic.  A wound care referral was placed for management of a superficial abdominal wound.  In the interim, Lauren [name] was seen earlier today by vascular surgery (Dr. Carlis Abbott) and will undergo aortogram with left lower extremity arteriogram due to critical limb ischemia in the left leg.  Lauren [name] states that she feels fairly well today.  She has no specific concerns to discuss today. She states that her abdominal wound has healed nicely. She reports that she will be seeing another hematologist at [facility] in [place], [place] in May for a second opinion regarding hypercoagulable state.  Past Medical History:  Diagnosis Date   Anemia    CAD (coronary artery disease)    a. s/p cath in 03/2014 showing 30% mid-LAD, moderate to severe disease along small D1, patent LCx, moderate to severe distal OM2 stenosis and moderate diffuse diease along RCA not amenable to PCI   CHF (congestive heart failure) ([place])    a. EF 55-60% in 12/2019 b. EF at 35-40% by echo in 05/2020   CKD (chronic kidney disease) stage 4, GFR 15-29 ml/min (HCC)    Diabetes mellitus without complication ([place])    Myocardial infarction ([place])    Neuropathy    Stroke [facility])    Past Surgical History:  Procedure Laterality Date   AMPUTATION Left 09/02/2021   Procedure: AMPUTATION RAY;  Surgeon: Criselda Peaches, DPM;  Location: [place];  Service: Podiatry;  Laterality: Left;  sagittal saw, 3L bag saline & Pulse   Cardiac catherization     CHOLECYSTECTOMY     I & D EXTREMITY Left 08/30/2021   Procedure: IRRIGATION AND DEBRIDEMENT WITH BONE  BIOPSY;  Surgeon: [name], DPM;  Location: [place];  Service: Podiatry;  Laterality: Left;   IR ANGIO VERTEBRAL SEL SUBCLAVIAN INNOMINATE UNI L MOD SED  01/18/2022   IR CT HEAD LTD  01/08/2022   IR INTRA CRAN STENT  01/08/2022   IR PERCUTANEOUS ART THROMBECTOMY/INFUSION INTRACRANIAL INC DIAG ANGIO  01/08/2022   IRRIGATION AND DEBRIDEMENT FOOT Left 09/04/2021   Procedure: IRRIGATION AND DEBRIDEMENT FOOT AND CLOSURE;  Surgeon: Criselda Peaches, DPM;  Location: [place];  Service: Podiatry;  Laterality: Left;   RADIOLOGY WITH ANESTHESIA N/A 01/08/2022   Procedure: IR WITH ANESTHESIA;  Surgeon: Radiologist, Medication, MD;  Location: [place];  Service: Radiology;  Laterality: N/A;   THROMBECTOMY BRACHIAL ARTERY Right 11/18/2021   Procedure: RIGHT BRACHIAL, RADIAL, & ULNAR ARTERY THROMBECTOMY.;  Surgeon: Marty Heck, MD;  [facility];  Service: Vascular;  Laterality: Right;   TUBAL LIGATION     Social History   Tobacco Use   Smoking status: Former    Packs/day: 0.25    Types: Cigarettes    Quit date: 10/27/2020    Years since quitting: 1.6    Passive exposure: Past   Smokeless tobacco: Never  Vaping Use   Vaping Use: Never used  Substance Use Topics   Alcohol use: No    Comment: occ   Drug use: No   Family History  Problem Relation Age of Onset   Thyroid disease Mother    Hypertension Mother    Hyperlipidemia Mother    CAD Mother    CVA Mother    Hyperlipidemia Father    Hypertension Father    CAD Father    Breast cancer Paternal Grandmother 76   Cancer Paternal Grandmother        lung   Allergies  Allergen Reactions   Wellbutrin [Bupropion] Hives   Cefepime Rash    Tolerates penicilllin   Ciprofloxacin Hcl Hives and Rash    Hives/rash at injection site; 01/15/22 tolerated IV cipro   Tape Rash   Review of Systems  Constitutional:  Positive for malaise/fatigue.  Skin:        Chronic wound of left foot     Objective:     BP 121/78   Pulse 80   Ht 5\' 5"  (1.651  m)   Wt 215 lb (97.5 kg)   LMP 04/28/2022   SpO2 97%   BMI 35.78 kg/m  BP Readings from Last 3 Encounters:  06/01/22 121/78  06/01/22 132/81  05/31/22 129/85   Physical Exam Vitals reviewed.  Constitutional:      Comments: Examined in wheelchair  HENT:     Head: Normocephalic and atraumatic.     Right Ear: External ear normal.     Left Ear: External ear normal.     Nose: Nose normal.     Mouth/Throat:     Mouth: Mucous membranes are moist.     Pharynx: Oropharynx is clear. No oropharyngeal exudate or posterior oropharyngeal erythema.  Eyes:     General: No scleral icterus.    Extraocular Movements: Extraocular movements intact.     Conjunctiva/sclera: Conjunctivae normal.     Pupils: Pupils are equal, round, and reactive to light.  Cardiovascular:     Rate and Rhythm: Normal rate and regular rhythm.     Pulses: Normal pulses.     Heart sounds: Murmur heard.  Pulmonary:     Effort: Pulmonary effort is normal.     Breath sounds: Normal breath sounds. No wheezing, rhonchi or rales.  Abdominal:     General: Abdomen is flat. Bowel sounds are normal.     Palpations: Abdomen is soft.  Musculoskeletal:        General: Signs of injury (Left foot wound bandage in place) present.     Cervical back: Normal range of motion.  Skin:    General: Skin is warm and dry.  Neurological:     Mental Status: She is alert and oriented to person, place, and time.     Cranial Nerves: No cranial nerve deficit.     Motor: Weakness (Left arm and leg.  Weakness is more prominent in the left upper extremity) present.  Psychiatric:        Mood and Affect: Mood normal.        Behavior: Behavior normal.   Last CBC Lab Results  Component Value Date   WBC 9.3 05/08/2022   HGB 12.1 05/08/2022   HCT 38.1 05/08/2022   MCV 89.0 05/08/2022   MCH 28.3 05/08/2022   RDW 15.3 05/08/2022   PLT 377 62/83/1517   Last metabolic panel Lab Results  Component Value Date   GLUCOSE 211 (H) 05/08/2022   NA  135 05/08/2022   K 4.3 05/08/2022   CL 103 05/08/2022   CO2 25 05/08/2022   BUN 29 (H) 05/08/2022   CREATININE 2.27 (H) 05/08/2022   GFRNONAA 27 (  L) 05/08/2022   CALCIUM 8.1 (L) 05/08/2022   PHOS 4.4 01/29/2022   PROT 6.8 05/08/2022   ALBUMIN 2.8 (L) 05/08/2022   LABGLOB 3.2 04/20/2022   AGRATIO 1.0 (L) 04/20/2022   BILITOT 0.3 05/08/2022   ALKPHOS 107 05/08/2022   AST 31 05/08/2022   ALT 41 05/08/2022   ANIONGAP 7 05/08/2022   Last lipids Lab Results  Component Value Date   CHOL 142 04/20/2022   HDL 37 (L) 04/20/2022   LDLCALC 81 04/20/2022   LDLDIRECT 84.9 01/01/2020   TRIG 133 04/20/2022   CHOLHDL 3.8 04/20/2022   Last hemoglobin A1c Lab Results  Component Value Date   HGBA1C 8.0 (H) 01/09/2022   Last thyroid functions Lab Results  Component Value Date   TSH 17.600 (H) 04/20/2022   Last vitamin D Lab Results  Component Value Date   VD25OH 15.9 (L) 06/01/2022   Last vitamin B12 and Folate Lab Results  Component Value Date   WLNLGXQJ19 417 04/20/2022   FOLATE 10.3 04/20/2022     Assessment & Plan:   Problem List Items Addressed This Visit       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  Last A1c 8.0 in September 2023. -Ophthalmology referral placed today for diabetic eye exam      Primary hypertension    BP 121/78 today.  Adequately controlled.  No medication changes indicated.      Cerebrovascular accident (CVA) ([place])    History of acute right MCA stroke in September 2023.  Followed by neurology (Dr. Leonie Man), last seen on 04/19/22 remains on Plavix/warfarin and atorvastatin. -Neurology follow-up scheduled for March -She will undergo evaluation by hematology at [facility] in [place], [place] in May for further evaluation of hypercoagulable state and indication for continued warfarin therapy.      Critical limb ischemia of left lower extremity ([place])    Followed by vascular  surgery (Dr. Carlis Abbott).  Last seen earlier today (1/30).  Planning for aortogram with left lower extremity arteriogram due to undetectable pedal pulses as well as dampened waveform.      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  Previously referred to nephrology.  Reports that she appointment scheduled for late February/early March.      Vitamin D deficiency    Previously documented history of vitamin D deficiency.  Repeat vitamin D level ordered today.      Return in about 3 months (around 08/31/2022).   Johnette Abraham, MD

## 2022-06-01 NOTE — Patient Instructions (Signed)
It was a pleasure to see you today.  Thank you for giving [name] the opportunity to be involved in your care.  Below is a brief recap of your visit and next steps.  We will plan to see you again in 3 months.  Summary I have refilled your medications today We will repeat your vitamin D level I have placed a referral to ophthalmology Follow up in 3 months

## 2022-06-02 ENCOUNTER — Other Ambulatory Visit: Payer: Self-pay | Admitting: Internal Medicine

## 2022-06-02 DIAGNOSIS — E559 Vitamin D deficiency, unspecified: Secondary | ICD-10-CM

## 2022-06-02 LAB — VITAMIN D 25 HYDROXY (VIT D DEFICIENCY, FRACTURES): Vit D, 25-Hydroxy: 15.9 ng/mL — ABNORMAL LOW (ref 30.0–100.0)

## 2022-06-02 MED ORDER — VITAMIN D (ERGOCALCIFEROL) 1.25 MG (50000 UNIT) PO CAPS: 50000.0000 [IU] | ORAL_CAPSULE | ORAL | 0 refills | Status: DC

## 2022-06-03 ENCOUNTER — Ambulatory Visit: Payer: BC Managed Care – PPO | Attending: Cardiology | Admitting: *Deleted

## 2022-06-03 DIAGNOSIS — Z5181 Encounter for therapeutic drug level monitoring: Secondary | ICD-10-CM

## 2022-06-03 DIAGNOSIS — I639 Cerebral infarction, unspecified: Secondary | ICD-10-CM

## 2022-06-03 LAB — POCT INR: INR: 2 (ref 2.0–3.0)

## 2022-06-03 MED ORDER — ENOXAPARIN SODIUM 150 MG/ML IJ SOSY: 150.0000 mg | PREFILLED_SYRINGE | INTRAMUSCULAR | 0 refills | Status: DC

## 2022-06-03 NOTE — Patient Instructions (Signed)
06/10/22  Abdominal Aortogram  05/08/22  Wt 97.5kg  Scr 2.27  CrCl 51.31  Hgb 12.1  Hct 38.1  Plts 415    06/04/22  Last dose of warfarin (5mg ) 2/3  No Lovenox or warfarin 2/4 - 2/7  Lovenox 150mg  sq @ 7am 2/8  No Lovenox----procedure-----Warfarin 7.5mg  pm 2/9 - 2/10  Lovenox 150mg  sq 7am and warfarin 7.5mg  pm 2/11 - 2/12  Lovenox 150mg  sq 7am and warfarin 5mg  pm 2/13  Lovenox 150mg  7am and INR appt @ 10:45am

## 2022-06-04 ENCOUNTER — Telehealth: Payer: Self-pay | Admitting: *Deleted

## 2022-06-04 NOTE — Telephone Encounter (Signed)
Patient is calling about her coumadin shots.  She states a prescription was sent for more.  She want to know if she needs to pick up that new script that was sent, she states she still has 14 left. Please advise.

## 2022-06-04 NOTE — Telephone Encounter (Signed)
I spoke to the patient who is to start Lovenox Injections Daily on 2/4.  She said that she has 120 mg syringes in her possession and cannot afford the 150 mg ordered and her timeline and medication list states 150 mg syringes.    I told her to inject her 1st dose on Sunday with what she has and then reach out to [place] on Monday morning for further advisement. She verbalized understanding.

## 2022-06-07 ENCOUNTER — Ambulatory Visit (HOSPITAL_BASED_OUTPATIENT_CLINIC_OR_DEPARTMENT_OTHER): Payer: BC Managed Care – PPO | Admitting: General Surgery

## 2022-06-07 NOTE — Assessment & Plan Note (Signed)
Followed by vascular surgery (Dr. Carlis Abbott).  Last seen earlier today (1/30).  Planning for aortogram with left lower extremity arteriogram due to undetectable pedal pulses as well as dampened waveform.

## 2022-06-07 NOTE — Assessment & Plan Note (Signed)
Previously documented history of vitamin D deficiency. -Repeat vitamin D level ordered today 

## 2022-06-07 NOTE — Assessment & Plan Note (Signed)
Followed by endocrinology and has appointment on 2/15.  Last A1c 8.0 in September 2023.

## 2022-06-07 NOTE — Assessment & Plan Note (Signed)
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  Previously referred to nephrology.  Reports that she appointment scheduled for late February/early March.

## 2022-06-07 NOTE — Assessment & Plan Note (Addendum)
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  Followed by neurology (Dr. Leonie Man), last seen on 04/19/22 remains on Plavix/warfarin and atorvastatin. -Neurology follow-up scheduled for March -She will undergo evaluation by hematology at [facility] in [place], [place] in May for further evaluation of hypercoagulable state and indication for continued warfarin therapy.

## 2022-06-07 NOTE — Assessment & Plan Note (Signed)
BP 121/78 today.  Adequately controlled.  No medication changes indicated.

## 2022-06-10 ENCOUNTER — Other Ambulatory Visit: Payer: Self-pay

## 2022-06-10 ENCOUNTER — Ambulatory Visit (HOSPITAL_COMMUNITY)
Admission: RE | Disposition: A | Discharge status: Home / Self Care | Payer: Self-pay | Source: Home / Self Care | Attending: Vascular Surgery

## 2022-06-10 ENCOUNTER — Ambulatory Visit (HOSPITAL_COMMUNITY)
Admission: RE | Admit: 2022-06-10 | Discharge: 2022-06-10 | Disposition: A | Discharge status: Home / Self Care | Payer: BC Managed Care – PPO | Attending: Vascular Surgery | Admitting: Vascular Surgery

## 2022-06-10 DIAGNOSIS — N184 Chronic kidney disease, stage 4 (severe): Secondary | ICD-10-CM | POA: Insufficient documentation

## 2022-06-10 DIAGNOSIS — E785 Hyperlipidemia, unspecified: Secondary | ICD-10-CM | POA: Diagnosis not present

## 2022-06-10 DIAGNOSIS — E1122 Type 2 diabetes mellitus with diabetic chronic kidney disease: Secondary | ICD-10-CM | POA: Insufficient documentation

## 2022-06-10 DIAGNOSIS — E11621 Type 2 diabetes mellitus with foot ulcer: Secondary | ICD-10-CM | POA: Insufficient documentation

## 2022-06-10 DIAGNOSIS — L97521 Non-pressure chronic ulcer of other part of left foot limited to breakdown of skin: Secondary | ICD-10-CM | POA: Diagnosis not present

## 2022-06-10 DIAGNOSIS — I130 Hypertensive heart and chronic kidney disease with heart failure and stage 1 through stage 4 chronic kidney disease, or unspecified chronic kidney disease: Secondary | ICD-10-CM | POA: Diagnosis not present

## 2022-06-10 DIAGNOSIS — I5022 Chronic systolic (congestive) heart failure: Secondary | ICD-10-CM | POA: Diagnosis not present

## 2022-06-10 DIAGNOSIS — Z87891 Personal history of nicotine dependence: Secondary | ICD-10-CM | POA: Insufficient documentation

## 2022-06-10 DIAGNOSIS — I2510 Atherosclerotic heart disease of native coronary artery without angina pectoris: Secondary | ICD-10-CM | POA: Diagnosis not present

## 2022-06-10 DIAGNOSIS — Z794 Long term (current) use of insulin: Secondary | ICD-10-CM | POA: Diagnosis not present

## 2022-06-10 DIAGNOSIS — Z7901 Long term (current) use of anticoagulants: Secondary | ICD-10-CM | POA: Diagnosis not present

## 2022-06-10 DIAGNOSIS — I70222 Atherosclerosis of native arteries of extremities with rest pain, left leg: Secondary | ICD-10-CM

## 2022-06-10 DIAGNOSIS — I700 Atherosclerosis of aorta: Secondary | ICD-10-CM | POA: Diagnosis not present

## 2022-06-10 DIAGNOSIS — Z8249 Family history of ischemic heart disease and other diseases of the circulatory system: Secondary | ICD-10-CM | POA: Diagnosis not present

## 2022-06-10 HISTORY — PX: PERIPHERAL VASCULAR BALLOON ANGIOPLASTY: CATH118281

## 2022-06-10 HISTORY — PX: PERIPHERAL VASCULAR INTERVENTION: CATH118257

## 2022-06-10 HISTORY — PX: ABDOMINAL AORTOGRAM W/LOWER EXTREMITY: CATH118223

## 2022-06-10 LAB — POCT I-STAT, CHEM 8
BUN: 34 mg/dL — ABNORMAL HIGH (ref 6–20)
Calcium, Ion: 1.23 mmol/L (ref 1.15–1.40)
Chloride: 104 mmol/L (ref 98–111)
Creatinine, Ser: 3.1 mg/dL — ABNORMAL HIGH (ref 0.44–1.00)
Glucose, Bld: 148 mg/dL — ABNORMAL HIGH (ref 70–99)
HCT: 34 % — ABNORMAL LOW (ref 36.0–46.0)
Hemoglobin: 11.6 g/dL — ABNORMAL LOW (ref 12.0–15.0)
Potassium: 3.8 mmol/L (ref 3.5–5.1)
Sodium: 140 mmol/L (ref 135–145)
TCO2: 25 mmol/L (ref 22–32)

## 2022-06-10 LAB — PROTIME-INR
INR: 1 (ref 0.8–1.2)
Prothrombin Time: 13.5 seconds (ref 11.4–15.2)

## 2022-06-10 LAB — GLUCOSE, CAPILLARY: Glucose-Capillary: 191 mg/dL — ABNORMAL HIGH (ref 70–99)

## 2022-06-10 SURGERY — ABDOMINAL AORTOGRAM W/LOWER EXTREMITY
Anesthesia: LOCAL

## 2022-06-10 MED ORDER — HYDRALAZINE HCL 20 MG/ML IJ SOLN: 5.0000 mg | INTRAMUSCULAR | Status: DC | PRN

## 2022-06-10 MED ORDER — NITROGLYCERIN 1 MG/10 ML FOR IR/CATH LAB
INTRA_ARTERIAL | Status: DC | PRN
  Administered 2022-06-10: 200 ug via INTRA_ARTERIAL

## 2022-06-10 MED ORDER — HEPARIN (PORCINE) IN NACL 1000-0.9 UT/500ML-% IV SOLN
INTRAVENOUS | Status: AC
  Filled 2022-06-10: qty 500

## 2022-06-10 MED ORDER — IODIXANOL 320 MG/ML IV SOLN
INTRAVENOUS | Status: DC | PRN
  Administered 2022-06-10: 4 mL

## 2022-06-10 MED ORDER — ONDANSETRON HCL 4 MG/2ML IJ SOLN: 4.0000 mg | Freq: Four times a day (QID) | INTRAMUSCULAR | Status: DC | PRN

## 2022-06-10 MED ORDER — LABETALOL HCL 5 MG/ML IV SOLN: 10.0000 mg | INTRAVENOUS | Status: DC | PRN

## 2022-06-10 MED ORDER — HEPARIN SODIUM (PORCINE) 1000 UNIT/ML IJ SOLN
INTRAMUSCULAR | Status: DC | PRN
  Administered 2022-06-10: 10000 [IU] via INTRAVENOUS

## 2022-06-10 MED ORDER — FENTANYL CITRATE (PF) 100 MCG/2ML IJ SOLN
INTRAMUSCULAR | Status: AC
  Filled 2022-06-10: qty 2

## 2022-06-10 MED ORDER — LIDOCAINE HCL (PF) 1 % IJ SOLN
INTRAMUSCULAR | Status: AC
  Filled 2022-06-10: qty 30

## 2022-06-10 MED ORDER — LIDOCAINE HCL (PF) 1 % IJ SOLN
INTRAMUSCULAR | Status: DC | PRN
  Administered 2022-06-10: 15 mL

## 2022-06-10 MED ORDER — METHYLPREDNISOLONE SODIUM SUCC 125 MG IJ SOLR
INTRAMUSCULAR | Status: AC
  Filled 2022-06-10: qty 2

## 2022-06-10 MED ORDER — DIPHENHYDRAMINE HCL 50 MG/ML IJ SOLN
INTRAMUSCULAR | Status: DC | PRN
  Administered 2022-06-10: 25 mg via INTRAVENOUS

## 2022-06-10 MED ORDER — METHYLPREDNISOLONE SODIUM SUCC 125 MG IJ SOLR
INTRAMUSCULAR | Status: DC | PRN
  Administered 2022-06-10: 125 mg via INTRAVENOUS

## 2022-06-10 MED ORDER — ONDANSETRON HCL 4 MG/2ML IJ SOLN
INTRAMUSCULAR | Status: AC
  Filled 2022-06-10: qty 2

## 2022-06-10 MED ORDER — FENTANYL CITRATE (PF) 100 MCG/2ML IJ SOLN
INTRAMUSCULAR | Status: DC | PRN
  Administered 2022-06-10 (×2): 25 ug via INTRAVENOUS

## 2022-06-10 MED ORDER — NITROGLYCERIN 1 MG/10 ML FOR IR/CATH LAB
INTRA_ARTERIAL | Status: AC
  Filled 2022-06-10: qty 10

## 2022-06-10 MED ORDER — DIPHENHYDRAMINE HCL 50 MG/ML IJ SOLN
INTRAMUSCULAR | Status: AC
  Filled 2022-06-10: qty 1

## 2022-06-10 MED ORDER — SODIUM CHLORIDE 0.9% FLUSH: 3.0000 mL | INTRAVENOUS | Status: DC | PRN

## 2022-06-10 MED ORDER — HEPARIN SODIUM (PORCINE) 1000 UNIT/ML IJ SOLN
INTRAMUSCULAR | Status: AC
  Filled 2022-06-10: qty 10

## 2022-06-10 MED ORDER — MIDAZOLAM HCL 5 MG/5ML IJ SOLN
INTRAMUSCULAR | Status: AC
  Filled 2022-06-10: qty 5

## 2022-06-10 MED ORDER — ACETAMINOPHEN 325 MG PO TABS
650.0000 mg | ORAL_TABLET | ORAL | Status: DC | PRN
  Administered 2022-06-10: 650 mg via ORAL
  Filled 2022-06-10: qty 2

## 2022-06-10 MED ORDER — MIDAZOLAM HCL 2 MG/2ML IJ SOLN
INTRAMUSCULAR | Status: DC | PRN
  Administered 2022-06-10: 1 mg via INTRAVENOUS

## 2022-06-10 MED ORDER — ONDANSETRON HCL 4 MG/2ML IJ SOLN
INTRAMUSCULAR | Status: DC | PRN
  Administered 2022-06-10 (×2): 4 mg via INTRAVENOUS

## 2022-06-10 MED ORDER — SODIUM CHLORIDE 0.9 % IV SOLN: INTRAVENOUS | Status: DC

## 2022-06-10 MED ORDER — SODIUM CHLORIDE 0.9 % IV SOLN: 250.0000 mL | INTRAVENOUS | Status: DC | PRN

## 2022-06-10 MED ORDER — CLOPIDOGREL BISULFATE 75 MG PO TABS
ORAL_TABLET | ORAL | Status: DC | PRN
  Administered 2022-06-10: 75 mg via ORAL

## 2022-06-10 MED ORDER — HEPARIN (PORCINE) IN NACL 1000-0.9 UT/500ML-% IV SOLN
INTRAVENOUS | Status: DC | PRN
  Administered 2022-06-10 (×2): 500 mL

## 2022-06-10 MED ORDER — SODIUM CHLORIDE 0.9% FLUSH: 3.0000 mL | Freq: Two times a day (BID) | INTRAVENOUS | Status: DC

## 2022-06-10 MED ORDER — CLOPIDOGREL BISULFATE 75 MG PO TABS
ORAL_TABLET | ORAL | Status: AC
  Filled 2022-06-10: qty 1

## 2022-06-10 SURGICAL SUPPLY — 26 items
BALLN STERLI SL OTW 2.5X80X150 (BALLOONS) ×2 IMPLANT
BALLN STERLING OTW 5X80X135 (BALLOONS) ×2 IMPLANT
BALLN STERLING SL OTW 2X80X150 (BALLOONS) ×2 IMPLANT
BALLOON STERLING OTW 5X80X135 (BALLOONS) IMPLANT
BALLOON STRLNG OTW 2.5X80X150 (BALLOONS) IMPLANT
BALLOON STRLNG SL OTW 2X80X150 (BALLOONS) IMPLANT
CATH NAVICROSS ST .035X135CM (MICROCATHETER) IMPLANT
CATH OMNI FLUSH 5F 65CM (CATHETERS) IMPLANT
CATH QUICKCROSS .018X135CM (MICROCATHETER) IMPLANT
DEVICE CLOSURE MYNXGRIP 6/7F (Vascular Products) IMPLANT
GLIDEWIRE ADV .035X260CM (WIRE) IMPLANT
KIT ANGIASSIST CO2 SYSTEM (KITS) IMPLANT
KIT ENCORE 26 ADVANTAGE (KITS) IMPLANT
KIT MICROPUNCTURE NIT STIFF (SHEATH) IMPLANT
KIT PV (KITS) ×3 IMPLANT
SHEATH CATAPULT 6F 45 MP (SHEATH) IMPLANT
SHEATH PINNACLE 5F 10CM (SHEATH) IMPLANT
SHEATH PINNACLE 6F 10CM (SHEATH) IMPLANT
SHEATH PROBE COVER 6X72 (BAG) IMPLANT
STENT ELUVIA 6X80X130 (Permanent Stent) IMPLANT
STOPCOCK MORSE 400PSI 3WAY (MISCELLANEOUS) IMPLANT
TRANSDUCER W/STOPCOCK (MISCELLANEOUS) ×3 IMPLANT
TRAY PV CATH (CUSTOM PROCEDURE TRAY) ×3 IMPLANT
TUBING CIL FLEX 10 FLL-RA (TUBING) IMPLANT
WIRE BENTSON .035X145CM (WIRE) IMPLANT
WIRE G V18X300CM (WIRE) IMPLANT

## 2022-06-10 NOTE — H&P (Signed)
History and Physical Interval Note:  06/10/2022 7:36 AM  Lauren [name]  has presented today for surgery, with the diagnosis of ischemia.  The various methods of treatment have been discussed with the patient and family. After consideration of risks, benefits and other options for treatment, the patient has consented to  Procedure(s): ABDOMINAL AORTOGRAM W/LOWER EXTREMITY (N/A) as a surgical intervention.  The patient's history has been reviewed, patient examined, no change in status, stable for surgery.  I have reviewed the patient's chart and labs.  Questions were answered to the patient's satisfaction.    Lower extremity angiogram with focus on left foot wound.  Marty Heck     Patient name: Lauren [name] MRN: [id number]        DOB: May 28, 1980          Sex: female   REASON FOR VISIT: Evaluate nonhealing left foot wound   HPI: Lauren [name] is a 42 y.o. female with chronic systolic heart failure with an EF of 35 to 40%, coronary artery disease, chronic kidney disease, hypertension, diabetes, hyperlipidemia that presents for evaluation of nonhealing left foot wound from podiatry.  Patient states this has been present for several months.  She just started going to the wound clinic yesterday.  ABIs on 01/14/2022 were 0.81 on the left monophasic.     She is well-known to our practice and previously presented with acutely ischemic right upper extremity requiring a right brachial, radial and ulnar artery thrombectomy on 11/18/2021.  She was discharged on Eliquis and is currently on Coumadin followed by Dr. Harl Bowie with cardiology and also in the Coumad[facility] clinic at [facility].       Past Medical History:  Diagnosis Date   Anemia     CAD (coronary artery disease)      a. s/p cath in 03/2014 showing 30% mid-LAD, moderate to severe disease along small D1, patent LCx, moderate to severe distal OM2 stenosis and moderate diffuse diease along RCA not amenable to PCI   CHF (congestive heart failure)  ([place])      a. EF 55-60% in 12/2019 b. EF at 35-40% by echo in 05/2020   CKD (chronic kidney disease) stage 4, GFR 15-29 ml/min (HCC)     Diabetes mellitus without complication ([place])     Myocardial infarction ([place])     Neuropathy     Stroke [facility])             Past Surgical History:  Procedure Laterality Date   AMPUTATION Left 09/02/2021    Procedure: AMPUTATION RAY;  Surgeon: Criselda Peaches, DPM;  Location: [place];  Service: Podiatry;  Laterality: Left;  sagittal saw, 3L bag saline & Pulse   Cardiac catherization       CHOLECYSTECTOMY       I & D EXTREMITY Left 08/30/2021    Procedure: IRRIGATION AND DEBRIDEMENT WITH BONE BIOPSY;  Surgeon: [name], DPM;  Location: [place];  Service: Podiatry;  Laterality: Left;   IR ANGIO VERTEBRAL SEL SUBCLAVIAN INNOMINATE UNI L MOD SED   01/18/2022   IR CT HEAD LTD   01/08/2022   IR INTRA CRAN STENT   01/08/2022   IR PERCUTANEOUS ART THROMBECTOMY/INFUSION INTRACRANIAL INC DIAG ANGIO   01/08/2022   IRRIGATION AND DEBRIDEMENT FOOT Left 09/04/2021    Procedure: IRRIGATION AND DEBRIDEMENT FOOT AND CLOSURE;  Surgeon: Criselda Peaches, DPM;  Location: [place];  Service: Podiatry;  Laterality: Left;   RADIOLOGY WITH ANESTHESIA N/A 01/08/2022    Procedure: IR  WITH ANESTHESIA;  Surgeon: Radiologist, Medication, MD;  Location: [place];  Service: Radiology;  Laterality: N/A;   THROMBECTOMY BRACHIAL ARTERY Right 11/18/2021    Procedure: RIGHT BRACHIAL, RADIAL, & ULNAR ARTERY THROMBECTOMY.;  Surgeon: Marty Heck, MD;  [facility];  Service: Vascular;  Laterality: Right;   TUBAL LIGATION               Family History  Problem Relation Age of Onset   Thyroid disease Mother     Hypertension Mother     Hyperlipidemia Mother     CAD Mother     CVA Mother     Hyperlipidemia Father     Hypertension Father     CAD Father     Breast cancer Paternal Grandmother 29   Cancer Paternal Grandmother          lung      SOCIAL HISTORY: Social History          Tobacco Use   Smoking status: Former      Packs/day: 0.25      Types: Cigarettes      Quit date: 10/27/2020      Years since quitting: 1.5      Passive exposure: Past   Smokeless tobacco: Never  Substance Use Topics   Alcohol use: No      Comment: occ           Allergies  Allergen Reactions   Wellbutrin [Bupropion] Hives   Cefepime Rash      Tolerates penicilllin   Ciprofloxacin Hcl Hives and Rash      Hives/rash at injection site; 01/15/22 tolerated IV cipro   Tape Rash            Current Outpatient Medications  Medication Sig Dispense Refill   acetaminophen (TYLENOL) 325 MG tablet Take 1-2 tablets (325-650 mg total) by mouth every 4 (four) hours as needed for mild pain. 100 tablet 0   amoxicillin-clavulanate (AUGMENTIN) 875-125 MG tablet Take 1 tablet by mouth 2 (two) times daily. 20 tablet 0   atorvastatin (LIPITOR) 40 MG tablet Take 1 tablet (40 mg total) by mouth daily. Courtesy fill/ pt to get established with a primary MD for further refills 30 tablet 0   clopidogrel (PLAVIX) 75 MG tablet Take 1 tablet (75 mg total) by mouth daily. 30 tablet 11   Continuous Blood Gluc Sensor (DEXCOM G6 SENSOR) MISC Change sensor every 10 days as directed 9 each 3   diclofenac Sodium (VOLTAREN ARTHRITIS PAIN) 1 % GEL Apply 2 g topically 4 (four) times daily. 150 g 5   furosemide (LASIX) 40 MG tablet Take 40 mg daily as needed for swelling 90 tablet 3   hydrALAZINE (APRESOLINE) 25 MG tablet Take 0.5 tablets (12.5 mg total) by mouth 2 (two) times daily. 30 tablet 6   insulin aspart (NOVOLOG) 100 UNIT/ML injection Use with Omnipod for daily dose around 60 units daily 40 mL 3   Insulin Disposable Pump (OMNIPOD 5 G6 POD, GEN 5,) MISC Change pod every 48-72 hours 6 each 3   isosorbide mononitrate (ISMO) 20 MG tablet Take 1 tablet (20 mg total) by mouth 2 (two) times daily. Courtesy fill/ pt to get established with a primary MD for further refills 60 tablet 0   levothyroxine (SYNTHROID) 125 MCG  tablet Take 1 tablet (125 mcg total) by mouth daily at 6 (six) AM. Courtesy fill/ pt to get established with a primary MD for further refills 30 tablet 0  magnesium oxide (MAG-OX) 400 MG tablet Take 0.5 tablets (200 mg total) by mouth at bedtime. 15 tablet 0   methocarbamol (ROBAXIN) 500 MG tablet Take 1 tablet (500 mg total) by mouth every 6 (six) hours as needed for muscle spasms. 60 tablet 0   metoprolol succinate (TOPROL XL) 50 MG 24 hr tablet Take 1 tablet (50 mg total) by mouth daily. 30 tablet 6   Multiple Vitamin (MULTIVITAMIN WITH MINERALS) TABS tablet Take 1 tablet by mouth daily.       nicotine (NICODERM CQ - DOSED IN MG/24 HOURS) 21 mg/24hr patch Place 1 patch (21 mg total) onto the skin daily. 28 patch 0   ondansetron (ZOFRAN) 4 MG tablet Take 1 tablet (4 mg total) by mouth every 8 (eight) hours as needed for nausea or vomiting. 90 tablet 0   oxyCODONE (OXY IR/ROXICODONE) 5 MG immediate release tablet Take 1 tablet (5 mg total) by mouth every 4 (four) hours as needed for up to 7 days for severe pain. 20 tablet 0   pantoprazole (PROTONIX) 40 MG tablet Take 1 tablet (40 mg total) by mouth daily. Courtesy fill/ pt to get established with a primary MD for further refills 30 tablet 0   polyethylene glycol (MIRALAX / GLYCOLAX) 17 g packet Take 17 g by mouth 2 (two) times daily. 60 each 0   ramelteon (ROZEREM) 8 MG tablet Take 1 tablet (8 mg total) by mouth at bedtime. 30 tablet 0   ranolazine (RANEXA) 500 MG 12 hr tablet Take 1 tablet (500 mg total) by mouth 2 (two) times daily. Courtesy fill/ pt to get established with a primary MD for further refills 60 tablet 0   tiZANidine (ZANAFLEX) 4 MG tablet Take 1 tablet (4 mg total) by mouth at bedtime. Courtesy fill/ pt to get established with a primary MD for further refills 30 tablet 0   traZODone (DESYREL) 50 MG tablet Take 1 tablet (50 mg total) by mouth at bedtime. 30 tablet 0   Vitamin D, Ergocalciferol, (DRISDOL) 1.25 MG (50000 UNIT) CAPS  capsule Take 1 capsule (50,000 Units total) by mouth every 7 (seven) days for 8 doses. 8 capsule 0   warfarin (COUMADIN) 5 MG tablet Take 1 tablet daily except 1 1/2 tablets on Wednesdays or as directed. 40 tablet 5   enoxaparin (LOVENOX) 120 MG/0.8ML injection Inject 0.8 mLs (120 mg total) into the skin daily. (Patient not taking: Reported on 05/31/2022) 24 mL 6   nitroGLYCERIN (NITROSTAT) 0.4 MG SL tablet Place 1 tablet (0.4 mg total) under the tongue every 5 (five) minutes x 3 doses as needed for chest pain. (Patient not taking: Reported on 06/01/2022) 25 tablet 3    No current facility-administered medications for this visit.      REVIEW OF SYSTEMS:  [X]  denotes positive finding, [ ]  denotes negative finding Cardiac   Comments:  Chest pain or chest pressure:      Shortness of breath upon exertion:      Short of breath when lying flat:      Irregular heart rhythm:             Vascular      Pain in calf, thigh, or hip brought on by ambulation:      Pain in feet at night that wakes you up from your sleep:       Blood clot in your veins:      Leg swelling:  Pulmonary      Oxygen at home:      Productive cough:       Wheezing:              Neurologic      Sudden weakness in arms or legs:       Sudden numbness in arms or legs:       Sudden onset of difficulty speaking or slurred speech:      Temporary loss of vision in one eye:       Problems with dizziness:              Gastrointestinal      Blood in stool:       Vomited blood:              Genitourinary      Burning when urinating:       Blood in urine:             Psychiatric      Major depression:              Hematologic      Bleeding problems:      Problems with blood clotting too easily:             Skin      Rashes or ulcers:             Constitutional      Fever or chills:          PHYSICAL EXAM:    Vitals:    06/01/22 0829  BP: 132/81  Pulse: 80  Resp: 16  Temp: (!) 97.2 F (36.2 C)   TempSrc: Temporal  SpO2: 98%  Weight: 215 lb (97.5 kg)  Height: 5\' 5"  (1.651 m)      GENERAL: The patient is a well-nourished female, in no acute distress. The vital signs are documented above. CARDIAC: There is a regular rate and rhythm.  VASCULAR:  Bilateral femoral pulses 1+ palpable No palpable left pedal pulses Left foot wound is pictured PULMONARY: No respiratory distress ABDOMEN: Soft and non-tender. MUSCULOSKELETAL: There are no major deformities or cyanosis. NEUROLOGIC: No focal weakness or paresthesias are detected. PSYCHIATRIC: The patient has a normal affect.      DATA:    ABIs on 01/14/2022 were 0.81 on the left monophasic.     Assessment/Plan:   42 y.o. female with chronic systolic heart failure with an EF of 35 to 40%, coronary artery disease, chronic kidney disease, hypertension, diabetes, hyperlipidemia that presents for evaluation of nonhealing left foot wound from podiatry.  I have recommended aortogram with lower extremity arteriogram with a focus on the left leg with possible intervention.  Her previous ABIs were 0.81 with an abnormal dampened monophasic waveform at the ankle.  I cannot palpate any pedal pulses on exam.  Discussed indication is to try and improve her inflow for wound healing.  She will need to hold her Coumadin as I discussed with her today.  I discussed the option of Lovenox bridge and he does not want to do this as she has had previous abdominal wounds from her Lovenox shots.  Will get scheduled for next Thursday in the Cath Lab.  Risks benefits discussed including risk of bleeding and vessel injury.  Discussed we may have to do some of this case with CO2 given her chronic kidney disease.     Marty Heck, MD [facility] Office: [phone]

## 2022-06-10 NOTE — Op Note (Signed)
Patient name: Lauren [name] MRN: [id number] DOB: 25-May-1980 Sex: female  06/10/2022 Pre-operative Diagnosis: Critical limb ischemia of the left lower extremity with tissue loss Post-operative diagnosis:  Same Surgeon:  Marty Heck, MD Procedure Performed: 1.  Ultrasound-guided access right common femoral artery 2.  CO2 aortogram with catheter selection of aorta 3.  Left lower extremity arteriogram with selection of third order branches 4.  Left anterior tibial and dorsalis pedis angioplasty (2.0 mm x 80 mm Sterling and 2.5 mm x 80 mm Sterling) 5.  Angioplasty and stent of mid left SFA (6 mm x 80 mm drug-coated Eluvia, post-dilated with 5 mm Mustang) 6.  Mynx closure of the right common femoral artery 7.  92 minutes of monitored moderate conscious sedation  Indications: Patient is a 42 year old female with multiple comorbidities including diabetes and chronic kidney disease referred by podiatry for a nonhealing wound to the left foot.  She presents today for aortogram, lower extremity arteriogram with a focus on the left leg after risks and benefits discussed.  We discussed most of this being done with CO2 given her chronic kidney disease.  Findings:   Aortogram with CO2 showed no flow-limiting stenosis in the aortoiliac segment.  Left lower extremity arteriogram was done mostly with CO2 and showed the common femoral and profunda were patent.  There was a <50% stenosis in the proximal SFA that did not appear flow-limiting.  She had a high-grade stenosis in the mid SFA that was 80%.  The above and below-knee popliteal artery was patent.  Runoff in the left lower extremity was in the peroneal which was the dominant runoff with an anterior tibial that occluded about 5 cm above the ankle with reconstitution of the dorsalis pedis.  The posterior tibial appears occluded.  Significant small vessel disease in the left foot.  From right transfemoral access, I was able to get down the left leg  and get through the anterior tibial occlusion into the dorsalis pedis of the foot.  This was treated with a 2.0 mm and 2.5 mm Sterling with excellent results.  She now has two-vessel runoff with anterior tibial and peroneal being patent.  The SFA high-grade stenosis was stented with a 6 mm x 80 mm drug-coated Eluvia and post-dilated with a 5 mm Mustang with widely patent stent at completion.   Procedure:  The patient was identified in the holding area and taken to room 8.  The patient was then placed supine on the table and prepped and draped in the usual sterile fashion.  A time out was called.  Patient received Versed and fentanyl for moderate conscious sedation.  Vital signs were monitored including heart rate, respiratory rate, oxygenation and blood pressure.  I was present for moderate sedation.  Ultrasound was used to evaluate the right common femoral artery.  It was patent .  A digital ultrasound image was acquired.  A micropuncture needle was used to access the right common femoral artery under ultrasound guidance.  An 018 wire was advanced without resistance and a micropuncture sheath was placed.  The 018 wire was removed and a benson wire was placed.  The micropuncture sheath was exchanged for a 5 french sheath.  An omniflush catheter was advanced over the wire to the level of L-1.  An abdominal angiogram was obtained.  Next, using the omniflush catheter and a benson wire, the aortic bifurcation was crossed and the catheter was placed into theleft external iliac artery and left runoff was obtained.  Pertinent  findings are noted above.  We elected to intervene on the left leg.  I used a glide advantage down the left SFA and exchanged for a long 6 [place] Ansell sheath in the right groin over the aortic bifurcation.  Patient was given 100 units/kg IV heparin.  I initially went down with an 018 catheter and a V18 and got into the anterior tibial.  The distal anterior tibial was occluded as demonstrated on  angiogram.  We were able to cross the occluded segment into the dorsalis pedis distally.  We did have to pause the case briefly as she had an episode of hypotension and bradycardia.  This appeared to be a vagal reaction from the CO2.  We also treated for a contrast allergy.  She improved and was back to her clinical baseline.  We elected for intervention.  I treated the dorsalis pedis with a 2.0 mm x 80 mm Sterling to nominal pressure for 2 minutes.  I then went and treated the distal AT where the vessel is larger more proximally with a 2.5 mm x 80 mm Sterling.  We gave some nitro and had excellent results with inline flow into the foot.  She does have significant small vessel disease.  I then exchanged and went back up to the SFA and got a planning shot with CO2.  The mid SFA high-grade stenosis was treated with a 6 mm x 80 mm drug-coated Eluvia postdilated with a 5 mm balloon.  Widely patent stent at completion.  Wires and catheters were removed.  A 6 French sheath was placed in the right groin we got a CO2 femoral arteriogram.  The groin was closed with a Mynx closure device.   Plan: Patient is optimized from a vascular standpoint.  Plavix statin from a vascular standpoint which she is already taking.  She is on Coumadin that she can resume tonight.  Marty Heck, MD [facility] [facility] Office: [phone]

## 2022-06-10 NOTE — Discharge Instructions (Addendum)
Patient can resume coumadin tonight after procedure.  Continue aspirin and plavix.  Femoral Site Care This sheet gives you information about how to care for yourself after your procedure. Your health care provider may also give you more specific instructions. If you have problems or questions, contact your health care provider. What can I expect after the procedure?  After the procedure, it is common to have: Bruising that usually fades within 1-2 weeks. Tenderness at the site. Follow these instructions at home: Wound care Follow instructions from your health care provider about how to take care of your insertion site. Make sure you: Wash your hands with soap and water before you change your bandage (dressing). If soap and water are not available, use hand sanitizer. Remove your dressing as told by your health care provider. In 24 hours Do not take baths, swim, or use a hot tub until your health care provider approves. You may shower 24-48 hours after the procedure or as told by your health care provider. Gently wash the site with plain soap and water. Pat the area dry with a clean towel. Do not rub the site. This may cause bleeding. Do not apply powder or lotion to the site. Keep the site clean and dry. Check your femoral site every day for signs of infection. Check for: Redness, swelling, or pain. Fluid or blood. Warmth. Pus or a bad smell. Activity For the first 2-3 days after your procedure, or as long as directed: Avoid climbing stairs as much as possible. Do not squat. Do not lift anything that is heavier than 10 lb (4.5 kg), or the limit that you are told, until your health care provider says that it is safe. For 5 days Rest as directed. Avoid sitting for a long time without moving. Get up to take short walks every 1-2 hours. Do not drive for 24 hours if you were given a medicine to help you relax (sedative). General instructions Take over-the-counter and prescription  medicines only as told by your health care provider. Keep all follow-up visits as told by your health care provider. This is important. Contact a health care provider if you have: A fever or chills. You have redness, swelling, or pain around your insertion site. Get help right away if: The catheter insertion area swells very fast. You pass out. You suddenly start to sweat or your skin gets clammy. The catheter insertion area is bleeding, and the bleeding does not stop when you hold steady pressure on the area. The area near or just beyond the catheter insertion site becomes pale, cool, tingly, or numb. These symptoms may represent a serious problem that is an emergency. Do not wait to see if the symptoms will go away. Get medical help right away. Call your local emergency services (911 in the [place] Do not drive yourself to the hospital. Summary After the procedure, it is common to have bruising that usually fades within 1-2 weeks. Check your femoral site every day for signs of infection. Do not lift anything that is heavier than 10 lb (4.5 kg), or the limit that you are told, until your health care provider says that it is safe. This information is not intended to replace advice given to you by your health care provider. Make sure you discuss any questions you have with your health care provider. Document Revised: 05/02/2017 Document Reviewed: 05/02/2017 Elsevier Patient Education  2020 [employer].

## 2022-06-11 ENCOUNTER — Encounter (HOSPITAL_COMMUNITY): Payer: Self-pay | Admitting: Vascular Surgery

## 2022-06-11 ENCOUNTER — Other Ambulatory Visit (HOSPITAL_COMMUNITY): Payer: Self-pay

## 2022-06-11 ENCOUNTER — Other Ambulatory Visit: Payer: Self-pay

## 2022-06-11 ENCOUNTER — Telehealth: Payer: Self-pay | Admitting: Vascular Surgery

## 2022-06-11 MED FILL — Midazolam HCl Inj 5 MG/5ML (Base Equivalent): INTRAMUSCULAR | Qty: 1 | Status: AC

## 2022-06-11 NOTE — Telephone Encounter (Signed)
-----   Message from Marty Heck, MD sent at 06/10/2022  9:58 AM EST ----- Patient name: Lauren [name] MRN: [id number]        DOB: January 23, 1981          Sex: female   06/10/2022 Pre-operative Diagnosis: Critical limb ischemia of the left lower extremity with tissue loss Post-operative diagnosis:  Same Surgeon:  Marty Heck, MD Procedure Performed: 1.  Ultrasound-guided access right common femoral artery 2.  CO2 aortogram with catheter selection of aorta 3.  Left lower extremity arteriogram with selection of third order branches 4.  Left anterior tibial and dorsalis pedis angioplasty (2.0 mm x 80 mm Sterling and 2.5 mm x 80 mm Sterling) 5.  Angioplasty and stent of mid left SFA (6 mm x 80 mm drug-coated Eluvia, post-dilated with 5 mm Mustang) 6.  Mynx closure of the right common femoral artery 7.  92 minutes of monitored moderate conscious sedation   #Can you arrange follow-up in one month with left leg arterial duplex and ABI?  Thanks,  Gerald Stabs

## 2022-06-12 ENCOUNTER — Other Ambulatory Visit (HOSPITAL_COMMUNITY): Payer: Self-pay

## 2022-06-14 ENCOUNTER — Ambulatory Visit: Payer: Self-pay | Admitting: *Deleted

## 2022-06-14 NOTE — Patient Outreach (Signed)
  Care Coordination   06/14/2022  Name: Lauren [name] MRN: [id number] DOB: 1981/04/11   Care Coordination Outreach Attempts:  An unsuccessful telephone outreach was attempted today to offer the patient information about available care coordination services as a benefit of their health plan. CSW unable to leave HIPAA compliant message on voicemail, as mailbox is full.     Follow Up Plan:  Additional outreach attempts will be made to offer the patient care coordination information and services.    Encounter Outcome:  No Answer.    Care Coordination Interventions:  No, not indicated.     Nat Christen, BSW, MSW, LCSW  Licensed Education officer, [facility]  Mailing [place] [address], [place], [place] [address] Physical Address-[address] [place],  [address] Toll Free Main # [phone] Fax # [phone] Cell # [phone] [email or web address]

## 2022-06-15 ENCOUNTER — Encounter: Payer: Self-pay | Admitting: Physical Medicine & Rehabilitation

## 2022-06-15 ENCOUNTER — Ambulatory Visit: Payer: BC Managed Care – PPO | Attending: Cardiology | Admitting: *Deleted

## 2022-06-15 ENCOUNTER — Other Ambulatory Visit: Payer: Self-pay | Admitting: Cardiology

## 2022-06-15 DIAGNOSIS — I639 Cerebral infarction, unspecified: Secondary | ICD-10-CM | POA: Diagnosis not present

## 2022-06-15 DIAGNOSIS — Z5181 Encounter for therapeutic drug level monitoring: Secondary | ICD-10-CM | POA: Diagnosis not present

## 2022-06-15 LAB — POCT INR: INR: 1.7 — AB (ref 2.0–3.0)

## 2022-06-15 MED ORDER — OXYCODONE HCL 5 MG PO TABS: 5.0000 mg | ORAL_TABLET | Freq: Four times a day (QID) | ORAL | 0 refills | Status: DC | PRN

## 2022-06-15 NOTE — Patient Instructions (Signed)
S/P Abdominal aortogram 06/10/22  Take warfarin 2 tablets tonight and tomorrow night then resume 1 tablet daily except 1 1/2 tablets on Wednesdays and Saturdays.  Continue Lovenox injections 2 more days (Wednesday and Thursdays) then stop Recheck INR in 1 wk

## 2022-06-15 NOTE — Telephone Encounter (Signed)
Refill request for warfarin:  Last INR was 1.7 on 06/15/22 Next INR due 06/21/22 LOV was 05/10/22  Zandra Abts MD  Refill approved.

## 2022-06-16 NOTE — Patient Instructions (Signed)

## 2022-06-17 ENCOUNTER — Encounter: Payer: Self-pay | Admitting: Nurse Practitioner

## 2022-06-17 ENCOUNTER — Ambulatory Visit (INDEPENDENT_AMBULATORY_CARE_PROVIDER_SITE_OTHER): Payer: BC Managed Care – PPO | Admitting: Nurse Practitioner

## 2022-06-17 VITALS — BP 139/80 | HR 87 | Ht 65.0 in | Wt 215.8 lb

## 2022-06-17 DIAGNOSIS — E039 Hypothyroidism, unspecified: Secondary | ICD-10-CM | POA: Diagnosis not present

## 2022-06-17 DIAGNOSIS — E782 Mixed hyperlipidemia: Secondary | ICD-10-CM

## 2022-06-17 DIAGNOSIS — I10 Essential (primary) hypertension: Secondary | ICD-10-CM | POA: Diagnosis not present

## 2022-06-17 DIAGNOSIS — E1065 Type 1 diabetes mellitus with hyperglycemia: Secondary | ICD-10-CM | POA: Diagnosis not present

## 2022-06-17 MED ORDER — OMNIPOD 5 DEXG7G6 PODS GEN 5 MISC: 3 refills | Status: DC

## 2022-06-17 MED ORDER — INSULIN ASPART 100 UNIT/ML IJ SOLN: INTRAMUSCULAR | 3 refills | Status: DC

## 2022-06-17 MED ORDER — LEVOTHYROXINE SODIUM 125 MCG PO TABS: 125.0000 ug | ORAL_TABLET | Freq: Every day | ORAL | 3 refills | Status: DC

## 2022-06-17 NOTE — Progress Notes (Signed)
Endocrinology Follow Up Note       06/17/2022, 3:32 PM   Subjective:    Patient ID: Lauren [name], female    DOB: [birth year].  Lauren [name] is being seen in follow up after being seen in consultation for management of currently uncontrolled symptomatic diabetes requested by  Johnette Abraham, MD.   Past Medical History:  Diagnosis Date   Anemia    CAD (coronary artery disease)    a. s/p cath in 03/2014 showing 30% mid-LAD, moderate to severe disease along small D1, patent LCx, moderate to severe distal OM2 stenosis and moderate diffuse diease along RCA not amenable to PCI   CHF (congestive heart failure) ([place])    a. EF 55-60% in 12/2019 b. EF at 35-40% by echo in 05/2020   CKD (chronic kidney disease) stage 4, GFR 15-29 ml/min (HCC)    Diabetes mellitus without complication ([place])    Myocardial infarction ([place])    Neuropathy    Stroke [facility])     Past Surgical History:  Procedure Laterality Date   ABDOMINAL AORTOGRAM W/LOWER EXTREMITY N/A 06/10/2022   Procedure: ABDOMINAL AORTOGRAM W/LOWER EXTREMITY;  Surgeon: Marty Heck, MD;  Location: [place] [facility];  Service: Cardiovascular;  Laterality: N/A;   AMPUTATION Left 09/02/2021   Procedure: AMPUTATION RAY;  Surgeon: Criselda Peaches, DPM;  Location: [place];  Service: Podiatry;  Laterality: Left;  sagittal saw, 3L bag saline & Pulse   Cardiac catherization     CHOLECYSTECTOMY     I & D EXTREMITY Left 08/30/2021   Procedure: IRRIGATION AND DEBRIDEMENT WITH BONE BIOPSY;  Surgeon: [name], DPM;  Location: [place];  Service: Podiatry;  Laterality: Left;   IR ANGIO VERTEBRAL SEL SUBCLAVIAN INNOMINATE UNI L MOD SED  01/18/2022   IR CT HEAD LTD  01/08/2022   IR INTRA CRAN STENT  01/08/2022   IR PERCUTANEOUS ART THROMBECTOMY/INFUSION INTRACRANIAL INC DIAG ANGIO  01/08/2022   IRRIGATION AND DEBRIDEMENT FOOT Left 09/04/2021   Procedure: IRRIGATION AND DEBRIDEMENT  FOOT AND CLOSURE;  Surgeon: Criselda Peaches, DPM;  Location: [place];  Service: Podiatry;  Laterality: Left;   PERIPHERAL VASCULAR BALLOON ANGIOPLASTY Left 06/10/2022   Procedure: PERIPHERAL VASCULAR BALLOON ANGIOPLASTY;  Surgeon: Marty Heck, MD;  Location: [place] [facility];  Service: Cardiovascular;  Laterality: Left;  AT and DP   PERIPHERAL VASCULAR INTERVENTION Left 06/10/2022   Procedure: PERIPHERAL VASCULAR INTERVENTION;  Surgeon: Marty Heck, MD;  Location: [place] [facility];  Service: Cardiovascular;  Laterality: Left;  SFA   RADIOLOGY WITH ANESTHESIA N/A 01/08/2022   Procedure: IR WITH ANESTHESIA;  Surgeon: Radiologist, Medication, MD;  Location: [place];  Service: Radiology;  Laterality: N/A;   THROMBECTOMY BRACHIAL ARTERY Right 11/18/2021   Procedure: RIGHT BRACHIAL, RADIAL, & ULNAR ARTERY THROMBECTOMY.;  Surgeon: Marty Heck, MD;  Location: [place];  Service: Vascular;  Laterality: Right;   TUBAL LIGATION      Social History   Socioeconomic History   Marital status: Married    Spouse name: Jerelyn Scott   Number of children: 2   Years of education: 12   Highest education level: 12th grade  Occupational History  Comment: Full time   Occupation: CNA at [employer] Use   Smoking status: Former    Packs/day: 0.25    Types: Cigarettes    Quit date: 10/27/2020    Years since quitting: 1.6    Passive exposure: Past   Smokeless tobacco: Never  Vaping Use   Vaping Use: Never used  Substance and Sexual Activity   Alcohol use: No    Comment: occ   Drug use: No   Sexual activity: Yes    Birth control/protection: Surgical    Comment: tubal   Other Topics Concern   Not on file  Social History Narrative   Lives with husband and kids   Right handed   Drinks 9+ cups caffeine daily   Social Determinants of Health   Financial Resource Strain: Low Risk  (03/03/2022)   Overall Financial Resource Strain (CARDIA)    Difficulty of Paying Living Expenses: Not  very hard  Recent Concern: Emergency planning/management officer Strain - High Risk (01/14/2022)   Overall Financial Resource Strain (CARDIA)    Difficulty of Paying Living Expenses: Hard  Food Insecurity: No Food Insecurity (03/03/2022)   Hunger Vital Sign    Worried About Running Out of Food in the Last Year: Never true    [place] in the Last Year: Never true  Recent Concern: Food Insecurity - Food Insecurity Present (01/16/2022)   Hunger Vital Sign    Worried About Running Out of Food in the Last Year: Sometimes true    Ran Out of Food in the Last Year: Sometimes true  Transportation Needs: No Transportation Needs (03/03/2022)   PRAPARE - Hydrologist (Medical): No    Lack of Transportation (Non-Medical): No  Physical Activity: Inactive (03/03/2022)   Exercise Vital Sign    Days of Exercise per Week: 0 days    Minutes of Exercise per Session: 0 min  Stress: No Stress Concern Present (03/03/2022)   [place]    Feeling of Stress : Only a little  Recent Concern: Stress - Stress Concern Present (12/22/2021)   [place]    Feeling of Stress : To some extent  Social Connections: Socially Integrated (03/03/2022)   Social Connection and Isolation Panel [NHANES]    Frequency of Communication with Friends and Family: More than three times a week    Frequency of Social Gatherings with Friends and Family: More than three times a week    Attends Religious Services: More than 4 times per year    Active Member of [employer] or Organizations: Yes    Attends Music therapist: More than 4 times per year    Marital Status: Married    Family History  Problem Relation Age of Onset   Thyroid disease Mother    Hypertension Mother    Hyperlipidemia Mother    CAD Mother    CVA Mother    Hyperlipidemia Father    Hypertension Father    CAD Father     Breast cancer Paternal Grandmother 46   Cancer Paternal Grandmother        lung    Outpatient Encounter Medications as of 06/17/2022  Medication Sig   acetaminophen (TYLENOL) 325 MG tablet Take 1-2 tablets (325-650 mg total) by mouth every 4 (four) hours as needed for mild pain. (Patient taking differently: Take 650 mg by mouth as needed for mild pain.)  atorvastatin (LIPITOR) 40 MG tablet Take 1 tablet (40 mg total) by mouth daily. Courtesy fill/ pt to get established with a primary MD for further refills   clopidogrel (PLAVIX) 75 MG tablet Take 1 tablet (75 mg total) by mouth daily.   Continuous Blood Gluc Sensor (DEXCOM G6 SENSOR) MISC Change sensor every 10 days as directed   diclofenac Sodium (VOLTAREN ARTHRITIS PAIN) 1 % GEL Apply 2 g topically 4 (four) times daily. (Patient taking differently: Apply 1 Application topically 2 (two) times daily as needed (pain).)   furosemide (LASIX) 40 MG tablet Take 40 mg daily as needed for swelling   hydrALAZINE (APRESOLINE) 25 MG tablet Take 0.5 tablets (12.5 mg total) by mouth 2 (two) times daily.   isosorbide mononitrate (ISMO) 20 MG tablet Take 1 tablet (20 mg total) by mouth 2 (two) times daily. Courtesy fill/ pt to get established with a primary MD for further refills   magnesium oxide (MAG-OX) 400 MG tablet Take 0.5 tablets (200 mg total) by mouth at bedtime. (Patient taking differently: Take 400 mg by mouth daily.)   methocarbamol (ROBAXIN) 500 MG tablet Take 1 tablet (500 mg total) by mouth every 6 (six) hours as needed for muscle spasms. (Patient taking differently: Take 500 mg by mouth as needed for muscle spasms.)   metoprolol succinate (TOPROL XL) 50 MG 24 hr tablet Take 1 tablet (50 mg total) by mouth daily.   metoprolol succinate (TOPROL-XL) 25 MG 24 hr tablet Take 25 mg by mouth in the morning and at bedtime.   Multiple Vitamin (MULTIVITAMIN WITH MINERALS) TABS tablet Take 1 tablet by mouth daily.   nitroGLYCERIN (NITROSTAT) 0.4 MG SL  tablet Place 1 tablet (0.4 mg total) under the tongue every 5 (five) minutes x 3 doses as needed for chest pain.   ondansetron (ZOFRAN) 4 MG tablet Take 1 tablet (4 mg total) by mouth every 8 (eight) hours as needed for nausea or vomiting.   oxyCODONE (OXY IR/ROXICODONE) 5 MG immediate release tablet Take 1 tablet (5 mg total) by mouth every 6 (six) hours as needed for severe pain.   pantoprazole (PROTONIX) 40 MG tablet Take 1 tablet (40 mg total) by mouth daily. Courtesy fill/ pt to get established with a primary MD for further refills (Patient taking differently: Take 40 mg by mouth daily as needed (reflux).)   polyethylene glycol (MIRALAX / GLYCOLAX) 17 g packet Take 17 g by mouth 2 (two) times daily. (Patient taking differently: Take 17 g by mouth as needed for moderate constipation.)   ramelteon (ROZEREM) 8 MG tablet Take 1 tablet (8 mg total) by mouth at bedtime.   ranolazine (RANEXA) 500 MG 12 hr tablet Take 1 tablet (500 mg total) by mouth 2 (two) times daily. Courtesy fill/ pt to get established with a primary MD for further refills   SANTYL 250 UNIT/GM ointment Apply 1 Application topically daily.   tiZANidine (ZANAFLEX) 4 MG tablet Take 1 tablet (4 mg total) by mouth at bedtime. Courtesy fill/ pt to get established with a primary MD for further refills   traZODone (DESYREL) 50 MG tablet Take 1 tablet (50 mg total) by mouth at bedtime.   warfarin (COUMADIN) 5 MG tablet TAKE ONE TABLET BY MOUTH DAILY EXCEPT 1 AND 1/2 TABLET ON WEDNESDAYS AND SATURDAYS OR AS DIRECTED   [DISCONTINUED] Insulin Disposable Pump (OMNIPOD 5 G6 POD, GEN 5,) MISC Change pod every 48-72 hours   [DISCONTINUED] levothyroxine (SYNTHROID) 125 MCG tablet Take 1 tablet (125 mcg total)  by mouth daily at 6 (six) AM. Courtesy fill/ pt to get established with a primary MD for further refills (Patient taking differently: Take 125 mcg by mouth daily.)   amoxicillin-clavulanate (AUGMENTIN) 875-125 MG tablet Take 1 tablet by mouth 2  (two) times daily. (Patient not taking: Reported on 06/10/2022)   enoxaparin (LOVENOX) 150 MG/ML injection Inject 1 mL (150 mg total) into the skin daily for 10 days.   ibuprofen (ADVIL) 200 MG tablet Take 400 mg by mouth as needed for moderate pain or mild pain.   insulin aspart (NOVOLOG) 100 UNIT/ML injection Use with Omnipod for daily dose around 80 units daily   Insulin Disposable Pump (OMNIPOD 5 G6 PODS, GEN 5,) MISC Change pod every 48-72 hours   levothyroxine (SYNTHROID) 125 MCG tablet Take 1 tablet (125 mcg total) by mouth daily at 6 (six) AM.   Vitamin D, Ergocalciferol, (DRISDOL) 1.25 MG (50000 UNIT) CAPS capsule Take 1 capsule (50,000 Units total) by mouth every 7 (seven) days for 8 doses. (Patient not taking: Reported on 06/17/2022)   [DISCONTINUED] insulin aspart (NOVOLOG) 100 UNIT/ML injection Use with Omnipod for daily dose around 60 units daily (Patient taking differently: Use with Omnipod for daily dose around 60 units daily. Sliding scale)   No facility-administered encounter medications on file as of 06/17/2022.    ALLERGIES: Allergies  Allergen Reactions   Visipaque [Iodixanol] Nausea And Vomiting    Patient had vagal response during procedure became hypertensive, and bradycardic. CO2 used during procedure prior to contrast being used, this may be cause as well. Recommended premedicating prior to contrast being used in the future.    Wellbutrin [Bupropion] Hives   Cefepime Rash    Tolerates penicilllin   Ciprofloxacin Hcl Hives and Rash    Hives/rash at injection site; 01/15/22 tolerated IV cipro   Tape Rash    VACCINATION STATUS: Immunization History  Administered Date(s) Administered   Influenza-Unspecified 05/01/2021   Moderna Sars-Covid-2 Vaccination 09/23/2019, 10/28/2019    Diabetes She presents for her follow-up diabetic visit. She has type 1 diabetes mellitus. Onset time: diagnosed at age of 36. Her disease course has been improving. There are no hypoglycemic  associated symptoms. Pertinent negatives for hypoglycemia include no nervousness/anxiousness. Associated symptoms include blurred vision, fatigue and foot paresthesias. Pertinent negatives for diabetes include no polydipsia, no polyuria and no weight loss. There are no hypoglycemic complications. Symptoms are resolved. Diabetic complications include a CVA, heart disease, nephropathy and PVD. Risk factors for coronary artery disease include diabetes mellitus, dyslipidemia, family history, obesity, hypertension and tobacco exposure. Current diabetic treatment includes insulin pump. She is compliant with treatment most of the time. Her weight is fluctuating minimally. She is following a generally unhealthy diet. When asked about meal planning, she reported none. She has not had a previous visit with a dietitian. She rarely participates in exercise. Her home blood glucose trend is decreasing steadily. Her overall blood glucose range is >200 mg/dl. (She presents today, accompanied by her sister, with her CGM and Omnipod showing slightly above target postprandial readings.  Her POCT A1c today is 7.8%, improving from last visit of 8%.  She has been dealing with nonhealing wound to left foot, has had multiple procedures to help healing process.  Analysis of her CGM shows TIR 49%, TAR 51%, TBR 0% with a GMI of 8.2%.  She appears to need more prandial insulin.  ) An ACE inhibitor/angiotensin II receptor blocker is not being taken. She does not see a podiatrist.Eye exam is current.  Hypertension This is a chronic problem. The current episode started more than 1 year ago. The problem is unchanged. The problem is controlled. Associated symptoms include blurred vision. Agents associated with hypertension include thyroid hormones. Risk factors for coronary artery disease include diabetes mellitus, dyslipidemia, family history and smoking/tobacco exposure. Past treatments include beta blockers, diuretics and direct vasodilators.  The current treatment provides moderate improvement. Compliance problems include diet and exercise.  Hypertensive end-organ damage includes kidney disease, CAD/MI, CVA, heart failure and PVD. Identifiable causes of hypertension include chronic renal disease and a thyroid problem.  Hyperlipidemia This is a chronic problem. The current episode started more than 1 year ago. The problem is controlled. Recent lipid tests were reviewed and are normal. Exacerbating diseases include chronic renal disease, diabetes, hypothyroidism and obesity. Factors aggravating her hyperlipidemia include beta blockers and fatty foods. Current antihyperlipidemic treatment includes statins and fibric acid derivatives. The current treatment provides moderate improvement of lipids. Compliance problems include adherence to diet and adherence to exercise.  Risk factors for coronary artery disease include diabetes mellitus, dyslipidemia, family history, obesity and hypertension.  Thyroid Problem Presents for initial visit. Symptoms include fatigue. Patient reports no anxiety or weight loss. The symptoms have been stable. Past treatments include levothyroxine. Her past medical history is significant for diabetes, heart failure, hyperlipidemia and obesity. Risk factors include family history of hypothyroidism.     Review of systems  Constitutional: + stable body weight, current Body mass index is 35.91 kg/m., + fatigue, no subjective hyperthermia, no subjective hypothermia Eyes: + blurry vision, no xerophthalmia ENT: no sore throat, no nodules palpated in throat, no dysphagia/odynophagia, no hoarseness Cardiovascular: no chest pain, no shortness of breath, no palpitations, no leg swelling Respiratory: no cough, no shortness of breath Gastrointestinal: no nausea/vomiting/diarrhea Musculoskeletal: no muscle/joint aches, left foot in ortho boot Skin: no rashes, no hyperemia Neurological: no tremors, no numbness, no tingling, no  dizziness Psychiatric: no depression, no anxiety  Objective:     BP 139/80 (BP Location: Right Arm, Patient Position: Sitting, Cuff Size: Large)   Pulse 87   Ht 5' 5"$  (1.651 m)   Wt 215 lb 12.8 oz (97.9 kg)   BMI 35.91 kg/m   Wt Readings from Last 3 Encounters:  06/17/22 215 lb 12.8 oz (97.9 kg)  06/10/22 213 lb 13.5 oz (97 kg)  06/01/22 215 lb (97.5 kg)     BP Readings from Last 3 Encounters:  06/17/22 139/80  06/10/22 117/77  06/01/22 121/78      Physical Exam- Limited  Constitutional:  Body mass index is 35.91 kg/m. , not in acute distress, normal state of mind Eyes:  EOMI, no exophthalmos Musculoskeletal: no gross deformities, strength intact in all four extremities, no gross restriction of joint movements Skin:  no rashes, no hyperemia Neurological: no tremor with outstretched hands    CMP ( most recent) CMP     Component Value Date/Time   NA 140 06/10/2022 0600   NA 135 04/20/2022 1022   K 3.8 06/10/2022 0600   CL 104 06/10/2022 0600   CO2 25 05/08/2022 2150   GLUCOSE 148 (H) 06/10/2022 0600   BUN 34 (H) 06/10/2022 0600   BUN 24 (H) 04/20/2022 1022   CREATININE 3.10 (H) 06/10/2022 0600   CALCIUM 8.1 (L) 05/08/2022 2150   PROT 6.8 05/08/2022 2150   PROT 6.3 04/20/2022 1022   ALBUMIN 2.8 (L) 05/08/2022 2150   ALBUMIN 3.1 (L) 04/20/2022 1022   AST 31 05/08/2022 2150   ALT 41  05/08/2022 2150   ALKPHOS 107 05/08/2022 2150   BILITOT 0.3 05/08/2022 2150   BILITOT <0.2 04/20/2022 1022   GFRNONAA 27 (L) 05/08/2022 2150   GFRAA 31 (L) 02/01/2020 1328     Diabetic Labs (most recent): Lab Results  Component Value Date   HGBA1C 8.0 (H) 01/09/2022   HGBA1C 9.6 09/29/2021   HGBA1C 9.8 (H) 06/19/2021   MICROALBUR 150 10/19/2021     Lipid Panel ( most recent) Lipid Panel     Component Value Date/Time   CHOL 142 04/20/2022 1022   TRIG 133 04/20/2022 1022   HDL 37 (L) 04/20/2022 1022   CHOLHDL 3.8 04/20/2022 1022   CHOLHDL 5.7 01/09/2022 0503    VLDL 31 01/09/2022 0503   LDLCALC 81 04/20/2022 1022   LDLDIRECT 84.9 01/01/2020 0319   LABVLDL 24 04/20/2022 1022      Lab Results  Component Value Date   TSH 17.600 (H) 04/20/2022   TSH 41.777 (H) 01/28/2022   TSH 25.117 (H) 08/28/2021   TSH 0.268 (L) 06/13/2020   TSH 5.860 (H) 11/17/2015   TSH 33.130 (H) 07/21/2015   TSH 40.980 (H) 03/07/2015   FREET4 1.12 04/20/2022   FREET4 1.52 11/17/2015   FREET4 0.73 (L) 07/21/2015   FREET4 0.77 (L) 03/07/2015           Assessment & Plan:   1) Type 1 diabetes mellitus with hyperglycemia ([place])  She presents today, accompanied by her sister, with her CGM and Omnipod showing slightly above target postprandial readings.  Her POCT A1c today is 7.8%, improving from last visit of 8%.  She has been dealing with nonhealing wound to left foot, has had multiple procedures to help healing process.  Analysis of her CGM shows TIR 49%, TAR 51%, TBR 0% with a GMI of 8.2%.  She appears to need more prandial insulin.   - Oneisha Eure has currently uncontrolled symptomatic type 1 DM since 42 years of age.   -Recent labs reviewed.  - I had a long discussion with her about the progressive nature of diabetes and the pathology behind its complications. -her diabetes is complicated by CAD, CVA, CKD, PVD, and CHF and she remains at a high risk for more acute and chronic complications which include CAD, CVA, CKD, retinopathy, and neuropathy. These are all discussed in detail with her.  The following Lifestyle Medicine recommendations according to [facility]) were discussed and offered to patient and she agrees to start the journey:  A. Whole Foods, Plant-based plate comprising of fruits and vegetables, plant-based proteins, whole-grain carbohydrates was discussed in detail with the patient.   A list for source of those nutrients were also provided to the patient.  Patient will use only water or unsweetened tea for  hydration. B.  The need to stay away from risky substances including alcohol, smoking; obtaining 7 to 9 hours of restorative sleep, at least 150 minutes of moderate intensity exercise weekly, the importance of healthy social connections,  and stress reduction techniques were discussed. C.  A full color page of  Calorie density of various food groups per pound showing examples of each food groups was provided to the patient.  - Nutritional counseling repeated at each appointment due to patients tendency to fall back in to old habits.  - The patient admits there is a room for improvement in their diet and drink choices. -  Suggestion is made for the patient to avoid simple carbohydrates from their diet including Cakes,  Sweet Desserts / Pastries, Ice Cream, Soda (diet and regular), Sweet Tea, Candies, Chips, Cookies, Sweet Pastries, Store Bought Juices, Alcohol in Excess of 1-2 drinks a day, Artificial Sweeteners, Coffee Creamer, and "Sugar-free" Products. This will help patient to have stable blood glucose profile and potentially avoid unintended weight gain.   - I encouraged the patient to switch to unprocessed or minimally processed complex starch and increased protein intake (animal or plant source), fruits, and vegetables.   - Patient is advised to stick to a routine mealtimes to eat 3 meals a day and avoid unnecessary snacks (to snack only to correct hypoglycemia).  - I have approached her with the following individualized plan to manage her diabetes and patient agrees:   Given her type 1 diagnosis, insulin is the only effective evidence-based treatment option.  Based on her CGM data, I did make several small changes to her pump settings today.  I changed her target blood glucose to 110 (instead of 120), changed her insulin to carb ratio to 1 unit per 8 grams of carbs, and changed active time to 3 hours instead of 4). I did send in scripts for her pods and insulin today.  Cost is still a concern  for her as she is waiting for disability.  We discussed back up option of changing to basal/bolus injections if she cannot get her pods and we discussed that would be based on her recent pump setting as to how much of each would be recommended.  I advised her to reach out to me if she finds herself unable to afford her pump supplies.  -she is encouraged to start monitoring glucose 4 times daily (using her CGM), before meals and before bed and to call the clinic if she has readings less than 70 or above 300 for 3 tests in a row.  - she is warned not to take insulin without proper monitoring per orders. - Adjustment parameters are given to her for hypo and hyperglycemia in writing.  - Specific targets for  A1c; LDL, HDL, and Triglycerides were discussed with the patient.  2) Blood Pressure /Hypertension:  her blood pressure is controlled to target.   she is advised to continue her current medications including Lasix 40 mg po daily as needed and Metoprolol 37.5 mg po twice daily.  3) Lipids/Hyperlipidemia:    Review of her recent lipid panel from 04/20/22 showed controlled LDL at 81.  she is advised to continue Lipitor 40 mg daily at bedtime.  4)  Weight/Diet:  her Body mass index is 35.91 kg/m.  -  clearly complicating her diabetes care.   she is a candidate for weight loss. I discussed with her the fact that loss of 5 - 10% of her  current body weight will have the most impact on her diabetes management.  Exercise, and detailed carbohydrates information provided  -  detailed on discharge instructions.  5) Hypothyroidism-unspecified The details surrounding her diagnosis are unavailable.  There are no recent TFTs to review.  She is currently on Levothyroxine 125.  Will recheck TFTs prior to next visit.   - The correct intake of thyroid hormone (Levothyroxine, Synthroid), is on empty stomach first thing in the morning, with water, separated by at least 30 minutes from breakfast and other  medications,  and separated by more than 4 hours from calcium, iron, multivitamins, acid reflux medications (PPIs).  - This medication is a life-long medication and will be needed to correct thyroid hormone imbalances for the rest  of your life.  The dose may change from time to time, based on thyroid blood work.  - It is extremely important to be consistent taking this medication, near the same time each morning.  -AVOID TAKING PRODUCTS CONTAINING BIOTIN (commonly found in Hair, Skin, Nails vitamins) AS IT INTERFERES WITH THE VALIDITY OF THYROID FUNCTION BLOOD TESTS.  6) Chronic Care/Health Maintenance: -she is not on ACEI/ARB and is on Statin medications and is encouraged to initiate and continue to follow up with Ophthalmology, Dentist, Podiatrist at least yearly or according to recommendations, and advised to stay away from smoking. I have recommended yearly flu vaccine and pneumonia vaccine at least every 5 years; moderate intensity exercise for up to 150 minutes weekly; and sleep for at least 7 hours a day.  - she is advised to maintain close follow up with [name] [name], MD for primary care needs, as well as her other providers for optimal and coordinated care.      I spent  44  minutes in the care of the patient today including review of labs from [place], Lipids, Thyroid Function, Hematology (current and previous including abstractions from other facilities); face-to-face time discussing  her blood glucose readings/logs, discussing hypoglycemia and hyperglycemia episodes and symptoms, medications doses, her options of short and long term treatment based on the latest standards of care / guidelines;  discussion about incorporating lifestyle medicine;  and documenting the encounter. Risk reduction counseling performed per USPSTF guidelines to reduce obesity and cardiovascular risk factors.     Please refer to Patient Instructions for Blood Glucose Monitoring and Insulin/Medications Dosing  Guide"  in media tab for additional information. Please  also refer to " Patient Self Inventory" in the Media  tab for reviewed elements of pertinent patient history.  [name] participated in the discussions, expressed understanding, and voiced agreement with the above plans.  All questions were answered to her satisfaction. she is encouraged to contact clinic should she have any questions or concerns prior to her return visit.     Follow up plan: - Return in about 4 months (around 10/16/2022) for Diabetes F/U with A1c in office, Thyroid follow up, Previsit labs, Bring meter and logs.   Rayetta Pigg, [facility] [address] [place], [place] [address] Phone: [phone] Fax: [phone]  06/17/2022, 3:32 PM

## 2022-06-21 DIAGNOSIS — I509 Heart failure, unspecified: Secondary | ICD-10-CM | POA: Diagnosis not present

## 2022-06-21 DIAGNOSIS — N184 Chronic kidney disease, stage 4 (severe): Secondary | ICD-10-CM | POA: Diagnosis not present

## 2022-06-21 DIAGNOSIS — I129 Hypertensive chronic kidney disease with stage 1 through stage 4 chronic kidney disease, or unspecified chronic kidney disease: Secondary | ICD-10-CM | POA: Diagnosis not present

## 2022-06-21 DIAGNOSIS — E1022 Type 1 diabetes mellitus with diabetic chronic kidney disease: Secondary | ICD-10-CM | POA: Diagnosis not present

## 2022-06-21 DIAGNOSIS — I2510 Atherosclerotic heart disease of native coronary artery without angina pectoris: Secondary | ICD-10-CM | POA: Diagnosis not present

## 2022-06-21 DIAGNOSIS — R809 Proteinuria, unspecified: Secondary | ICD-10-CM | POA: Diagnosis not present

## 2022-06-21 DIAGNOSIS — I739 Peripheral vascular disease, unspecified: Secondary | ICD-10-CM | POA: Diagnosis not present

## 2022-06-21 DIAGNOSIS — N179 Acute kidney failure, unspecified: Secondary | ICD-10-CM | POA: Diagnosis not present

## 2022-06-22 ENCOUNTER — Other Ambulatory Visit: Payer: Self-pay

## 2022-06-22 ENCOUNTER — Emergency Department (HOSPITAL_COMMUNITY)
Admission: EM | Admit: 2022-06-22 | Discharge: 2022-06-22 | Disposition: A | Discharge status: Home / Self Care | Payer: BC Managed Care – PPO | Attending: Emergency Medicine | Admitting: Emergency Medicine

## 2022-06-22 ENCOUNTER — Encounter (HOSPITAL_COMMUNITY): Payer: Self-pay

## 2022-06-22 ENCOUNTER — Ambulatory Visit: Payer: BC Managed Care – PPO | Attending: Cardiology | Admitting: *Deleted

## 2022-06-22 DIAGNOSIS — I639 Cerebral infarction, unspecified: Secondary | ICD-10-CM | POA: Diagnosis not present

## 2022-06-22 DIAGNOSIS — Z8673 Personal history of transient ischemic attack (TIA), and cerebral infarction without residual deficits: Secondary | ICD-10-CM | POA: Diagnosis not present

## 2022-06-22 DIAGNOSIS — Z5181 Encounter for therapeutic drug level monitoring: Secondary | ICD-10-CM | POA: Diagnosis not present

## 2022-06-22 DIAGNOSIS — R040 Epistaxis: Secondary | ICD-10-CM | POA: Diagnosis not present

## 2022-06-22 DIAGNOSIS — Z7902 Long term (current) use of antithrombotics/antiplatelets: Secondary | ICD-10-CM | POA: Diagnosis not present

## 2022-06-22 DIAGNOSIS — Z794 Long term (current) use of insulin: Secondary | ICD-10-CM | POA: Diagnosis not present

## 2022-06-22 DIAGNOSIS — Z7901 Long term (current) use of anticoagulants: Secondary | ICD-10-CM | POA: Diagnosis not present

## 2022-06-22 LAB — POCT INR: INR: 2.3 (ref 2.0–3.0)

## 2022-06-22 NOTE — ED Notes (Signed)
Pt out to nursing stating stating they are unable to wait for discharge paperwork due to pt having another appointment this morning.

## 2022-06-22 NOTE — Patient Instructions (Signed)
S/P Abdominal aortogram 06/10/22  Continue warfarin 1 tablet daily except 1 1/2 tablets on Wednesdays and Saturdays.  Recheck INR in 2 wks

## 2022-06-22 NOTE — ED Triage Notes (Addendum)
Pt presents nose bleed x2 days. Started yesterday and was small amount throughout day and this morning it got worse. Pt is on blood thinner. Endorses headache 6/10

## 2022-06-22 NOTE — ED Provider Notes (Signed)
[facility] Provider Note   CSN: [id number] Arrival date & time: 06/22/22  [id number]     History  No chief complaint on file.   Lauren [name] is a 42 y.o. female.  She has a history of stroke and is on Coumadin.  Complaining of on and off nosebleed for 2 days.  She gets trouble with nosebleeds every winter but it has been worse over the last few days.  It has been mostly coming from the left side but sometimes from the right.  She has had a little bit of pressure in her face but no fevers.  Nosebleed is stopped currently.  The history is provided by the patient and the spouse.  Epistaxis Location:  Bilateral Severity:  Moderate Duration:  2 days Timing:  Intermittent Progression:  Unchanged Chronicity:  Recurrent Context: anticoagulants   Relieved by:  Applying pressure Worsened by:  Nothing Ineffective treatments:  None tried Associated symptoms: facial pain and sinus pain   Associated symptoms: no fever   Risk factors: frequent nosebleeds        Home Medications Prior to Admission medications   Medication Sig Start Date End Date Taking? Authorizing Provider  acetaminophen (TYLENOL) 325 MG tablet Take 1-2 tablets (325-650 mg total) by mouth every 4 (four) hours as needed for mild pain. Patient taking differently: Take 650 mg by mouth as needed for mild pain. 02/18/22   Love, Ivan Anchors, PA-C  amoxicillin-clavulanate (AUGMENTIN) 875-125 MG tablet Take 1 tablet by mouth 2 (two) times daily. Patient not taking: Reported on 06/10/2022 05/25/22   Criselda Peaches, DPM  atorvastatin (LIPITOR) 40 MG tablet Take 1 tablet (40 mg total) by mouth daily. Courtesy fill/ pt to get established with a primary MD for further refills 06/01/22 11/28/22  Johnette Abraham, MD  clopidogrel (PLAVIX) 75 MG tablet Take 1 tablet (75 mg total) by mouth daily. 04/29/22   Garvin Fila, MD  Continuous Blood Gluc Sensor (DEXCOM G6 SENSOR) MISC Change sensor every 10  days as directed 09/29/21   Brita Romp, NP  diclofenac Sodium (VOLTAREN ARTHRITIS PAIN) 1 % GEL Apply 2 g topically 4 (four) times daily. Patient taking differently: Apply 1 Application topically 2 (two) times daily as needed (pain). 04/19/22   Jennye Boroughs, MD  enoxaparin (LOVENOX) 150 MG/ML injection Inject 1 mL (150 mg total) into the skin daily for 10 days. 06/06/22 06/16/22  Arnoldo Lenis, MD  furosemide (LASIX) 40 MG tablet Take 40 mg daily as needed for swelling 03/05/22   Arnoldo Lenis, MD  hydrALAZINE (APRESOLINE) 25 MG tablet Take 0.5 tablets (12.5 mg total) by mouth 2 (two) times daily. 05/10/22   Arnoldo Lenis, MD  ibuprofen (ADVIL) 200 MG tablet Take 400 mg by mouth as needed for moderate pain or mild pain.    [provider]  insulin aspart (NOVOLOG) 100 UNIT/ML injection Use with Omnipod for daily dose around 80 units daily 06/17/22   Brita Romp, NP  Insulin Disposable Pump (OMNIPOD 5 G6 PODS, GEN 5,) MISC Change pod every 48-72 hours 06/17/22   Brita Romp, NP  isosorbide mononitrate (ISMO) 20 MG tablet Take 1 tablet (20 mg total) by mouth 2 (two) times daily. Courtesy fill/ pt to get established with a primary MD for further refills 06/01/22 11/28/22  Johnette Abraham, MD  levothyroxine (SYNTHROID) 125 MCG tablet Take 1 tablet (125 mcg total) by mouth daily at 6 (six) AM. 06/17/22  06/12/23  Brita Romp, NP  magnesium oxide (MAG-OX) 400 MG tablet Take 0.5 tablets (200 mg total) by mouth at bedtime. Patient taking differently: Take 400 mg by mouth daily. 03/12/22   Benay Pike, MD  methocarbamol (ROBAXIN) 500 MG tablet Take 1 tablet (500 mg total) by mouth every 6 (six) hours as needed for muscle spasms. Patient taking differently: Take 500 mg by mouth as needed for muscle spasms. 04/05/22   Johnette Abraham, MD  metoprolol succinate (TOPROL XL) 50 MG 24 hr tablet Take 1 tablet (50 mg total) by mouth daily. 05/10/22   Arnoldo Lenis, MD   metoprolol succinate (TOPROL-XL) 25 MG 24 hr tablet Take 25 mg by mouth in the morning and at bedtime.    [provider]  Multiple Vitamin (MULTIVITAMIN WITH MINERALS) TABS tablet Take 1 tablet by mouth daily. 02/18/22   Love, Ivan Anchors, PA-C  nitroGLYCERIN (NITROSTAT) 0.4 MG SL tablet Place 1 tablet (0.4 mg total) under the tongue every 5 (five) minutes x 3 doses as needed for chest pain. 02/18/22   Love, Ivan Anchors, PA-C  ondansetron (ZOFRAN) 4 MG tablet Take 1 tablet (4 mg total) by mouth every 8 (eight) hours as needed for nausea or vomiting. 03/01/22   Lovorn, Jinny Blossom, MD  oxyCODONE (OXY IR/ROXICODONE) 5 MG immediate release tablet Take 1 tablet (5 mg total) by mouth every 6 (six) hours as needed for severe pain. 06/15/22   Jennye Boroughs, MD  pantoprazole (PROTONIX) 40 MG tablet Take 1 tablet (40 mg total) by mouth daily. Courtesy fill/ pt to get established with a primary MD for further refills Patient taking differently: Take 40 mg by mouth daily as needed (reflux). 06/01/22 11/28/22  Johnette Abraham, MD  polyethylene glycol (MIRALAX / GLYCOLAX) 17 g packet Take 17 g by mouth 2 (two) times daily. Patient taking differently: Take 17 g by mouth as needed for moderate constipation. 03/03/22   Bayard Hugger, NP  ramelteon (ROZEREM) 8 MG tablet Take 1 tablet (8 mg total) by mouth at bedtime. 02/19/22   Love, Ivan Anchors, PA-C  ranolazine (RANEXA) 500 MG 12 hr tablet Take 1 tablet (500 mg total) by mouth 2 (two) times daily. Courtesy fill/ pt to get established with a primary MD for further refills 06/01/22 08/30/22  Johnette Abraham, MD  SANTYL 250 UNIT/GM ointment Apply 1 Application topically daily. 05/31/22   [provider]  tiZANidine (ZANAFLEX) 4 MG tablet Take 1 tablet (4 mg total) by mouth at bedtime. Courtesy fill/ pt to get established with a primary MD for further refills 06/01/22   Johnette Abraham, MD  traZODone (DESYREL) 50 MG tablet Take 1 tablet (50 mg total) by mouth at  bedtime. 02/18/22   Love, Ivan Anchors, PA-C  Vitamin D, Ergocalciferol, (DRISDOL) 1.25 MG (50000 UNIT) CAPS capsule Take 1 capsule (50,000 Units total) by mouth every 7 (seven) days for 8 doses. Patient not taking: Reported on 06/17/2022 06/02/22 07/22/22  Johnette Abraham, MD  warfarin (COUMADIN) 5 MG tablet TAKE ONE TABLET BY MOUTH DAILY EXCEPT 1 AND 1/2 TABLET ON [facility] AND SATURDAYS OR AS DIRECTED 06/15/22   Arnoldo Lenis, MD      Allergies    Visipaque [iodixanol], Wellbutrin [bupropion], Cefepime, Ciprofloxacin hcl, and Tape    Review of Systems   Review of Systems  Constitutional:  Negative for fever.  HENT:  Positive for nosebleeds and sinus pain.     Physical Exam Updated Vital Signs BP  139/87   Pulse 80   Temp 97.6 F (36.4 C)   Resp 18   Wt 83.9 kg   SpO2 100%   BMI 30.79 kg/m  Physical Exam Constitutional:      Appearance: Normal appearance. She is well-developed.  HENT:     Head: Normocephalic and atraumatic.     Nose:     Comments: She has some dried blood in each of her nares.  There is no active bleeding.  No septal hematoma.  Do not see an obvious source on nasal septum.    Mouth/Throat:     Mouth: Mucous membranes are moist.     Pharynx: Oropharynx is clear.  Eyes:     Conjunctiva/sclera: Conjunctivae normal.  Musculoskeletal:     Cervical back: Neck supple.  Skin:    General: Skin is warm and dry.  Neurological:     Mental Status: She is alert.     GCS: GCS eye subscore is 4. GCS verbal subscore is 5. GCS motor subscore is 6.     ED Results / Procedures / Treatments   Labs (all labs ordered are listed, but only abnormal results are displayed) Labs Reviewed - No data to display  EKG None  Radiology No results found.  Procedures Procedures    Medications Ordered in ED Medications - No data to display  ED Course/ Medical Decision Making/ A&P                             Medical Decision Making  Differential diagnosis includes  epistaxis, septal hematoma, supratherapeutic coags, anemia, nasal trauma, foreign body.  Last INR checked was about 7 days ago and is 1.7.  Foreign body on exam.  Patient is not actively bleeding.  We discussed management strategies.  Recommended close follow-up with PCP.  Return instructions discussed        Final Clinical Impression(s) / ED Diagnoses Final diagnoses:  Epistaxis    Rx / DC Orders ED Discharge Orders     None         Hayden Rasmussen, MD 06/22/22 [phone]

## 2022-06-22 NOTE — Discharge Instructions (Addendum)
If your nose rebleeds please hold pressure for 15 minutes.  Keep well-hydrated.  Humidifier may help.  Return to the emergency department if any worsening or concerning symptoms

## 2022-06-23 ENCOUNTER — Other Ambulatory Visit: Payer: Self-pay

## 2022-06-23 ENCOUNTER — Ambulatory Visit (HOSPITAL_COMMUNITY): Payer: BC Managed Care – PPO | Attending: Physical Medicine & Rehabilitation

## 2022-06-23 DIAGNOSIS — R262 Difficulty in walking, not elsewhere classified: Secondary | ICD-10-CM | POA: Diagnosis not present

## 2022-06-23 DIAGNOSIS — R29898 Other symptoms and signs involving the musculoskeletal system: Secondary | ICD-10-CM | POA: Insufficient documentation

## 2022-06-23 DIAGNOSIS — I63511 Cerebral infarction due to unspecified occlusion or stenosis of right middle cerebral artery: Secondary | ICD-10-CM | POA: Insufficient documentation

## 2022-06-23 DIAGNOSIS — I639 Cerebral infarction, unspecified: Secondary | ICD-10-CM | POA: Diagnosis not present

## 2022-06-23 NOTE — Therapy (Signed)
OUTPATIENT PHYSICAL THERAPY NEURO EVALUATION   Patient Name: Lauren [name] MRN: [id number] DOB:Aug 24, 1980, 42 y.o., female Today's Date: 06/23/2022   PCP: Johnette Abraham, MD  REFERRING PROVIDER: Jennye Boroughs, MD  END OF SESSION:  PT End of Session - 06/23/22 1428     Visit Number 1    Number of Visits 16    Date for PT Re-Evaluation 08/18/22    Authorization Type BC[employer]    Authorization - Visit Number 1    Authorization - Number of Visits 30    Progress Note Due on Visit 8    PT Start Time 1300    PT Stop Time 1345    PT Time Calculation (min) 45 min    Activity Tolerance Patient limited by fatigue            Past Medical History:  Diagnosis Date   Anemia    CAD (coronary artery disease)    a. s/p cath in 03/2014 showing 30% mid-LAD, moderate to severe disease along small D1, patent LCx, moderate to severe distal OM2 stenosis and moderate diffuse diease along RCA not amenable to PCI   CHF (congestive heart failure) ([place])    a. EF 55-60% in 12/2019 b. EF at 35-40% by echo in 05/2020   CKD (chronic kidney disease) stage 4, GFR 15-29 ml/min (HCC)    Diabetes mellitus without complication ([place])    Myocardial infarction ([place])    Neuropathy    Stroke [facility])    Past Surgical History:  Procedure Laterality Date   ABDOMINAL AORTOGRAM W/LOWER EXTREMITY N/A 06/10/2022   Procedure: ABDOMINAL AORTOGRAM W/LOWER EXTREMITY;  Surgeon: Marty Heck, MD;  Location: [place] [facility];  Service: Cardiovascular;  Laterality: N/A;   AMPUTATION Left 09/02/2021   Procedure: AMPUTATION RAY;  Surgeon: Criselda Peaches, DPM;  Location: [place];  Service: Podiatry;  Laterality: Left;  sagittal saw, 3L bag saline & Pulse   Cardiac catherization     CHOLECYSTECTOMY     I & D EXTREMITY Left 08/30/2021   Procedure: IRRIGATION AND DEBRIDEMENT WITH BONE BIOPSY;  Surgeon: [name], DPM;  Location: [place];  Service: Podiatry;  Laterality: Left;   IR ANGIO VERTEBRAL SEL SUBCLAVIAN  INNOMINATE UNI L MOD SED  01/18/2022   IR CT HEAD LTD  01/08/2022   IR INTRA CRAN STENT  01/08/2022   IR PERCUTANEOUS ART THROMBECTOMY/INFUSION INTRACRANIAL INC DIAG ANGIO  01/08/2022   IRRIGATION AND DEBRIDEMENT FOOT Left 09/04/2021   Procedure: IRRIGATION AND DEBRIDEMENT FOOT AND CLOSURE;  Surgeon: Criselda Peaches, DPM;  Location: [place];  Service: Podiatry;  Laterality: Left;   PERIPHERAL VASCULAR BALLOON ANGIOPLASTY Left 06/10/2022   Procedure: PERIPHERAL VASCULAR BALLOON ANGIOPLASTY;  Surgeon: Marty Heck, MD;  Location: [place] [facility];  Service: Cardiovascular;  Laterality: Left;  AT and DP   PERIPHERAL VASCULAR INTERVENTION Left 06/10/2022   Procedure: PERIPHERAL VASCULAR INTERVENTION;  Surgeon: Marty Heck, MD;  Location: [place] [facility];  Service: Cardiovascular;  Laterality: Left;  SFA   RADIOLOGY WITH ANESTHESIA N/A 01/08/2022   Procedure: IR WITH ANESTHESIA;  Surgeon: Radiologist, Medication, MD;  Location: [place];  Service: Radiology;  Laterality: N/A;   THROMBECTOMY BRACHIAL ARTERY Right 11/18/2021   Procedure: RIGHT BRACHIAL, RADIAL, & ULNAR ARTERY THROMBECTOMY.;  Surgeon: Marty Heck, MD;  Location: [place];  Service: Vascular;  Laterality: Right;   TUBAL LIGATION     Patient Active Problem List   Diagnosis Date Noted   Critical  limb ischemia of left lower extremity ([place]) 06/01/2022   Open abdominal wall wound, initial encounter 04/28/2022   Insomnia 04/07/2022   Encounter for therapeutic drug monitoring 04/07/2022   Ulcer of left foot due to type 2 diabetes mellitus ([place])    Acute on chronic combined systolic and diastolic CHF (congestive heart failure) ([place])    Ischemic cardiomyopathy    Acute right MCA stroke ([place]) 01/28/2022   Elevated troponin    Aspiration pneumonia ([place]) 01/18/2022   Acute respiratory failure with hypoxia (HCC)    HFrEF (heart failure with reduced ejection fraction) ([place])    Right carotid artery occlusion 01/08/2022   Internal  carotid artery occlusion, 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   Menorrhagia with irregular cycle 12/15/2021   Pregnancy examination or test, negative result 12/15/2021   Encounter for IUD insertion 12/15/2021   Critical ischemia of upper extremity ([place]) 11/18/2021   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 ([place]) 11/18/2021   Cellulitis of left foot 09/02/2021   Acute osteomyelitis of left foot ([place]) 09/02/2021   Foot ulcer ([place]) 08/27/2021   Sepsis ([place]) 08/27/2021   Hypokalemia 08/27/2021   Cellulitis 06/18/2021   Chronic combined systolic and diastolic heart failure ([place]) 06/18/2021   History of CVA (cerebrovascular accident) 06/18/2021   Encounter for cervical Pap smear with pelvic exam 07/23/2020   Menorrhagia with regular cycle 07/23/2020   Iron deficiency anemia due to chronic blood loss 07/23/2020   Menstrual cramps 07/23/2020   Class 2 obesity 06/18/2020   CAD (coronary artery disease) 06/18/2020   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 ([place]) 06/18/2020   Unstable angina ([place]) 06/18/2020   Mixed hyperlipidemia    Class 2 obesity due to excess calories without serious comorbidity with body mass index (BMI) of 39.0 to 39.9 in adult    Acute renal failure superimposed on stage 4 chronic kidney disease ([place]) 01/01/2020   Acute ischemic stroke ([place]) 01/01/2020   Tobacco abuse 01/01/2020   Chest pain 12/31/2019   Cerebrovascular accident (CVA) ([place]) 12/31/2019   Left sided numbness 12/31/2019   Vitamin D deficiency 07/24/2015   Primary hypertension 03/17/2015   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 Patient reports that at times, the LE feels like it's going to give out. Patient states that she tends to lean towards the L side when she walks. Patient is also concerned about weakness on the L UE.  Patient had CVA in 01/08/2022 and had PT in the hospital. Upon D/C, patient was sent to inpatient rehab for 3 weeks. After which, patient had home health PT for around 3 weeks (ended in November 2023). Since November, patient did not receive any rehabilitation services. Husband reports that patient had to go to the ER yesterday due to a nose bleed. Husband reports that the bleeding is just a result of the air being dry. Patient is currently on anticoagulants so she is at risk for bleeding. Husband and patient were advised at the ER to use a humidifier. Patient also has a hx of L 5th toe amputation and is currently having a pressure sore close to the amputated area. Due to the pressure sore, patient is instructed to wear the boot especially when she's weightbearing. Per husband, patient is allowed to be in FWB on the L LE. 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  Marland Kitchen Pt accompanied by:  husband  PERTINENT HISTORY: Stents on L LE, L 5th toe amputation, pressure sore close to the amputated area (L 5th toe)  PAIN:  Are you having pain? No  PRECAUTIONS: pressure sore close to the amputated area (L 5th toe). Needs to wear the boot on weight-bearing.  WEIGHT BEARING RESTRICTIONS:  Needs to wear the boot on weight-bearing  FALLS: Has patient fallen in last 6 months? No  LIVING ENVIRONMENT: Lives with: lives with their family Lives in: Mobile home Has following equipment at home: Environmental consultant - 2 wheeled, Wheelchair (manual), Shower  bench, and Ramped entry  PLOF: Independent  PATIENT GOALS: "to be able to use the hand back"  OBJECTIVE:   DIAGNOSTIC FINDINGS:  MRI brain without contrast 01/14/2022 FINDINGS: The examination had to be discontinued prior to completion due to patient condition. Axial and coronal diffusion, axial T2* gradient echo, and axial T2 FLAIR sequences were obtained. The FLAIR sequence is severely motion degraded.   Numerous patchy infarcts throughout the right MCA territory are unchanged in extent compared to the prior MRI, with the most confluent regions of infarct again noted to be near watershed areas. Small infarcts in the corpus callosum, right cerebral peduncle, and left frontal white matter are also unchanged. No definite new infarct or sizable intracranial hemorrhage is identified. There is no midline shift or hydrocephalus. Chronic white matter disease involving the cerebral hemispheres and brainstem are not well evaluated on this truncated, motion degraded study.   IMPRESSION: 1. Motion degraded, incomplete examination. 2. Unchanged numerous infarcts as above, primarily involving the right MCA territory. 3. No definite new intracranial abnormality.  COGNITION: Overall cognitive status: Within functional limits for tasks assessed Demonstrates unilat neglect on the L (only drew the numbers on the R side of the clock)  SENSATION: Light touch: Impaired on L LE  COORDINATION: Intact/WFL on Heel-to-shin on B  EDEMA: mild swelling on L UE. No swelling noted on B LE   MUSCLE LENGTH: Mild tightness on B hamstrings Moderate tightness on R gastrocsoleus (L not tested due to the presence of a boot)  POSTURE: rounded shoulders, forward head, and slight flexor synergy on L UE  LOWER  EXTREMITY ROM:     Active  Right Eval Left Eval  Hip flexion [facility]  Hip extension [facility]  Hip abduction [facility]  Hip adduction    Hip internal rotation    Hip external rotation    Knee  flexion [facility] WFL  Knee extension [facility]  Ankle dorsiflexion [facility] WFL  Ankle plantarflexion [facility] WFL  Ankle inversion    Ankle eversion     (Blank rows = not tested)  LOWER EXTREMITY MMT:    MMT Right Eval Left Eval  Hip flexion 5 4-  Hip extension 3+ 2-  Hip abduction 4+ 4-  Hip adduction    Hip internal rotation    Hip external rotation    Knee flexion 5 3+  Knee extension 5 3+  Ankle dorsiflexion 5   Ankle plantarflexion 5   Ankle inversion    Ankle eversion    (Blank rows = not tested)  BED MOBILITY:  Sit to supine Complete Independence Supine to sit Complete Independence Rolling to Right Complete Independence Rolling to Left Complete Independence  TRANSFERS: Assistive device utilized: None  Sit to stand: Complete Independence Stand to sit: Complete Independence  GAIT: Gait pattern: step to pattern, decreased arm swing- Right, decreased arm swing- Left, decreased step length- Right, decreased step length- Left, decreased stance time- Left, decreased hip/knee flexion- Left, Left hip hike, Left foot flat, lateral lean- Left, poor foot clearance- Right, and poor foot clearance- Left Distance walked: 287 ft Assistive device utilized: None Level of assistance: SBA Comments: doesn't walk on straight path and deviates to the L. Testing done with the boot  FUNCTIONAL TESTS:  5 times sit to stand: 9.86 sec 2 minute walk test: 287 ft Tinetti POMA: 13 (balance + gait)  PATIENT SURVEYS:  FOTO 62.0154  TODAY'S TREATMENT:                                                                                                                              DATE:  06/23/22 Evaluation and patient education    PATIENT EDUCATION: Education details: Educated on the PT scope of practice. Educated on how SLP and OT can help with the patient. Education on falls prevention at home. Educated on the goals and course of rehab. Person educated: Patient and Spouse Education method:  Explanation Education comprehension: verbalized understanding  HOME EXERCISE PROGRAM: None provided to date  GOALS: Goals reviewed with patient? Yes  SHORT TERM GOALS: Target date: 07/21/2022  Pt will demonstrate indep in HEP to facilitate carry-over of skilled services and improve functional outcomes Goal status: INITIAL   LONG TERM GOALS: Target date: 08/18/2022  Pt will increase FOTO to at least 65 in order to demonstrate significant improvement in function related to ambulation Baseline: 62.0154 Goal status: INITIAL  2.  Pt will increase 2MWT by at least 40 ft in order to demonstrate clinically significant improvement in community ambulation  Baseline: 287 ft Goal status: INITIAL  3.  Pt will demonstrate increase in LE strength to 4+ to facilitate ease and safety in ambulation Baseline: 2- Goal status: INITIAL  4.  Patient will demonstrate increase in Tinetti Score by 6 points in order to demonstrate clinically significant improvement in balance and decreased risk for falls  Baseline: 13 Goal status: INITIAL  ASSESSMENT:  CLINICAL IMPRESSION: Patient is a 42 y.o. female who was seen today for physical therapy evaluation for CVA. Patient was diagnosed with CVA by referring provider further defined by difficulty with walling due to weakness, impaired proprioception/balance and decreased soft tissue extensibility. Skilled PT is required to address the impairments and functional limitations listed below. Patient also presents with signs and symptoms of unilat neglect so patient may benefit from SLP as unilat neglect can greatly affect patient's safety awareness especially when walking as she tends to deviate to the L. In addition, patient reports of UE weakness so patient may benefit from OT.  OBJECTIVE IMPAIRMENTS: Abnormal gait, decreased balance, difficulty walking, decreased strength, decreased safety awareness, and impaired flexibility.   ACTIVITY LIMITATIONS: bending,  standing, squatting, and stairs  PARTICIPATION LIMITATIONS: meal prep, cleaning, laundry, driving, community activity, and occupation  PERSONAL FACTORS: Fitness and 1-2 comorbidities: DM and unilat neglect  are also affecting patient's functional outcome.   REHAB POTENTIAL: Fair    CLINICAL DECISION MAKING: Stable/uncomplicated  EVALUATION COMPLEXITY: Moderate  PLAN:  PT FREQUENCY: 2x/week  PT DURATION: 8 weeks  PLANNED INTERVENTIONS: Therapeutic exercises, Therapeutic activity, Neuromuscular re-education, Balance training, Gait training, Patient/Family education, Self Care, Electrical stimulation, and Manual therapy  PLAN FOR NEXT SESSION: May beging LE strengthening, flexibility, balance and gait activities. Provide HEP. Refer patient to provider for a possible referral to SLP and OT.   Harvie Heck. Doran Nestle, PT, DPT, OCS Board-Certified Clinical Specialist in [place] # ([place]): [phone] T 06/23/2022, 4:47 PM

## 2022-06-24 ENCOUNTER — Encounter: Payer: Self-pay | Admitting: Nurse Practitioner

## 2022-06-24 ENCOUNTER — Telehealth: Payer: Self-pay | Admitting: *Deleted

## 2022-06-24 NOTE — Progress Notes (Signed)
  Care Coordination Note  06/24/2022 Name: [name] MRN: [id number] DOB: November 17, 1980  Lauren [name] is a 42 y.o. year old female who is a primary care patient of Johnette Abraham, MD and is actively engaged with the care management team. I reached out to [name] by phone today to assist with re-scheduling a follow up visit with the Licensed Clinical Social Worker  Follow up plan: Unsuccessful telephone outreach attempt made.   [name]  Direct Dial: [phone]

## 2022-06-24 NOTE — Telephone Encounter (Signed)
Didn't we fill out her stuff and send it recently?

## 2022-06-24 NOTE — Addendum Note (Signed)
Addended by: Jennye Boroughs on: 06/24/2022 05:36 PM   Modules accepted: Orders

## 2022-06-25 ENCOUNTER — Other Ambulatory Visit: Payer: Self-pay | Admitting: *Deleted

## 2022-06-25 NOTE — Patient Outreach (Signed)
  Care Coordination   06/25/2022  Name: Lauren [name] MRN: [id number] DOB: 1981/03/20   Care Coordination Outreach Attempts:  An unsuccessful telephone outreach was attempted today to offer the patient information about available care coordination services as a benefit of their health plan. HIPAA compliant message left on voicemail, providing contact information for CSW, encouraging patient to return CSW's call at her earliest convenience.  Follow Up Plan:  Additional outreach attempts will be made to offer the patient care coordination information and services.   Encounter Outcome:  No Answer.   Care Coordination Interventions:  No, not indicated.     Nat Christen, BSW, MSW, LCSW  Licensed Education officer, [employer] [facility]  Mailing [place] [address] [place], [place] [address] Physical Address-[address] [place], [place] [address] Toll Free Main # [phone] Fax # [phone] Cell # [phone] [email or web address]

## 2022-06-28 ENCOUNTER — Encounter (HOSPITAL_BASED_OUTPATIENT_CLINIC_OR_DEPARTMENT_OTHER): Payer: BC Managed Care – PPO | Attending: General Surgery | Admitting: General Surgery

## 2022-06-28 DIAGNOSIS — L97522 Non-pressure chronic ulcer of other part of left foot with fat layer exposed: Secondary | ICD-10-CM | POA: Diagnosis not present

## 2022-06-28 DIAGNOSIS — E1040 Type 1 diabetes mellitus with diabetic neuropathy, unspecified: Secondary | ICD-10-CM | POA: Diagnosis not present

## 2022-06-28 DIAGNOSIS — L97425 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 without evidence of necrosis: Secondary | ICD-10-CM | POA: Diagnosis not present

## 2022-06-28 DIAGNOSIS — E039 Hypothyroidism, unspecified: Secondary | ICD-10-CM | POA: Insufficient documentation

## 2022-06-28 DIAGNOSIS — I130 Hypertensive heart and chronic kidney disease with heart failure and stage 1 through stage 4 chronic kidney disease, or unspecified chronic kidney disease: Secondary | ICD-10-CM | POA: Diagnosis not present

## 2022-06-28 DIAGNOSIS — E10621 Type 1 diabetes mellitus with foot ulcer: Secondary | ICD-10-CM | POA: Insufficient documentation

## 2022-06-28 DIAGNOSIS — I5042 Chronic combined systolic (congestive) and diastolic (congestive) heart failure: Secondary | ICD-10-CM | POA: Insufficient documentation

## 2022-06-28 DIAGNOSIS — E669 Obesity, unspecified: Secondary | ICD-10-CM | POA: Diagnosis not present

## 2022-06-28 DIAGNOSIS — N184 Chronic kidney disease, stage 4 (severe): Secondary | ICD-10-CM | POA: Diagnosis not present

## 2022-06-28 DIAGNOSIS — I2510 Atherosclerotic heart disease of native coronary artery without angina pectoris: Secondary | ICD-10-CM | POA: Diagnosis not present

## 2022-06-28 DIAGNOSIS — Z794 Long term (current) use of insulin: Secondary | ICD-10-CM | POA: Insufficient documentation

## 2022-06-28 DIAGNOSIS — E1022 Type 1 diabetes mellitus with diabetic chronic kidney disease: Secondary | ICD-10-CM | POA: Diagnosis not present

## 2022-06-29 ENCOUNTER — Other Ambulatory Visit (HOSPITAL_COMMUNITY): Payer: Self-pay

## 2022-06-29 ENCOUNTER — Ambulatory Visit (INDEPENDENT_AMBULATORY_CARE_PROVIDER_SITE_OTHER): Payer: BC Managed Care – PPO | Admitting: Podiatry

## 2022-06-29 DIAGNOSIS — L97425 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 without evidence of necrosis: Secondary | ICD-10-CM

## 2022-06-29 DIAGNOSIS — L98499 Non-pressure chronic ulcer of skin of other sites with unspecified severity: Secondary | ICD-10-CM | POA: Diagnosis not present

## 2022-06-29 DIAGNOSIS — E10621 Type 1 diabetes mellitus with foot ulcer: Secondary | ICD-10-CM | POA: Diagnosis not present

## 2022-06-29 NOTE — Progress Notes (Addendum)
Lauren [name] ([id number]) [id number] Page 1 of 9 Visit Report for 06/28/2022 Chief Complaint Document Details Patient Name: Date of Service: Lauren [name], Lauren [name] 06/28/2022 12:45 PM Medical Record Number: [id number] Patient Account Number: [id number] Date of Birth/Sex: Treating RN: 1980/12/03 (42 y.o. F) Primary Care Provider: Marland Kitchen Other Clinician: Referring Provider: Treating Provider/Extender: Lauren [name] in Treatment: 5 Information Obtained from: Patient Chief Complaint Patients presents for treatment of an open diabetic ulcer on her abdomen Electronic Signature(s) Signed: 06/28/2022 1:33:46 PM By: Lauren Maudlin MD FACS Entered By: Lauren [name] on 06/28/2022 13:33:46 -------------------------------------------------------------------------------- Debridement Details Patient Name: Date of Service: Lauren [name], Lauren [name] 06/28/2022 12:45 PM Medical Record Number: [id number] Patient Account Number: [id number] Date of Birth/Sex: Treating RN: 11/12/1980 (42 y.o. [name] Primary Care Provider: Marland Kitchen Other Clinician: Referring Provider: Treating Provider/Extender: Lauren [name] in Treatment: 5 Debridement Performed for Assessment: Wound #2 [place] Performed By: Physician Lauren Maudlin, MD Debridement Type: Debridement Severity of Tissue Pre Debridement: Fat layer exposed Level of Consciousness (Pre-procedure): Awake and Alert Pre-procedure Verification/Time Out Yes - 13:05 Taken: Start Time: 13:05 Pain Control: Lidocaine 5% topical ointment T Area Debrided (L x W): otal 1.6 (cm) x 1.9 (cm) = 3.04 (cm) Tissue and other material debrided: Non-Viable, Slough, Subcutaneous, Slough Level: Skin/Subcutaneous Tissue Debridement Description: Excisional Instrument: Curette Specimen: Tissue Culture Number of Specimens T aken: 1 Bleeding: Minimum Hemostasis Achieved:  Pressure End Time: 13:06 Procedural Pain: 0 Post Procedural Pain: 0 Response to Treatment: Procedure was tolerated well Level of Consciousness (Post- Awake and Alert procedure): Post Debridement Measurements of Total Wound Length: (cm) 1.6 Width: (cm) 1.9 Depth: (cm) 0.5 Volume: (cm) 1.194 Character of Wound/Ulcer Post Debridement: Improved Severity of Tissue Post Debridement: Fat layer exposed Post Procedure Diagnosis Same as Pre-procedure Notes Scribed for Dr. Celine [name] by Lauren [name], Lauren [name] ([id number]) [phone].pdf Page 2 of 9 Electronic Signature(s) Signed: 06/28/2022 1:26:09 PM By: Lauren Maudlin MD FACS Signed: 06/28/2022 4:59:48 PM By: Lauren Catholic RN Entered By: Lauren [name] on 06/28/2022 13:14:55 -------------------------------------------------------------------------------- HPI Details Patient Name: Date of Service: Lauren [name], Lauren [name] 06/28/2022 12:45 PM Medical Record Number: [id number] Patient Account Number: [id number] Date of Birth/Sex: Treating RN: 1981-02-25 (42 y.o. 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 She has been on Lovenox injections and also wears cutaneous blood sugar monitor and insulin pump. Apparently about a month ago, she developed a small lump under the skin that subsequently ruptured. This is left a small ulcer in her left lower abdomen. Her primary care provider has had her applying  Neosporin and Band-Aids. According to the patient and her daughter, there is really been no change or improvement since that time. 05/31/2022: The wound on her abdomen has a layer of eschar on it. Her podiatrist, Dr. Sherryle [name], has also asked [name] to evaluate the wound on her foot. There is slough and eschar present. Apparently they had been using Iodosorb but recently changed to [facility]. 06/28/2022: Since our last visit, the patient was seen by Lauren [name] in vascular surgery. She underwent an angiogram with angioplasty and stenting to restore  inline flow to her foot. She has now been optimized from a vascular standpoint. She has been using Santyl on her foot. The wound is a little bit larger today and there is a fair amount of nonviable tissue present. No purulent drainage. Electronic Signature(s) Signed: 06/28/2022 1:35:12 PM By: Lauren Maudlin MD FACS Previous Signature: 06/28/2022 1:34:56 PM Version By: Lauren Maudlin MD FACS Entered By: Lauren [name] on 06/28/2022 13:35:12 -------------------------------------------------------------------------------- Physical Exam Details Patient Name: Date of Service: Lauren [name], Lauren [name] 06/28/2022 12:45 PM Medical Record Number: [id number] Patient Account Number: [id number] Date of Birth/Sex: Treating RN: 07-Jan-1981 (42 y.o. F) Primary Care Provider: Marland Kitchen Other Clinician: Referring Provider: Treating Provider/Extender: Lauren [name] in Treatment: 5 Constitutional . Slightly tachycardic. . . no acute distress. Respiratory Normal work of breathing on room air. Notes 06/28/2022: The wound is a little bit larger today and there is a fair amount of nonviable tissue present. No purulent drainage. Electronic Signature(s) Signed: 06/28/2022 1:35:59 PM By: Lauren Maudlin MD FACS Entered By: Lauren [name] on 06/28/2022  13:35:59 -------------------------------------------------------------------------------- Physician Orders Details Patient Name: Date of Service: Lauren [name], Lauren [name] 06/28/2022 12:45 PM Medical Record Number: [id number] Patient Account Number: [id number] Date of Birth/Sex: Treating RN: November 12, 1980 (42 y.o. 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 - Dr. Celine [name] Room 3 Return A Monday 07/05/22 at 3:45pm Anesthetic (In clinic) Topical Lidocaine 5% applied to wound bed Bathing/ Shower/ Hygiene May shower and wash wound with soap and water. Wound Treatment Wound #2 - Foot Wound Laterality: Plantar, Left Cleanser: Soap and Water 1 x Per Day/30 Days Discharge Instructions: May shower and wash wound with dial antibacterial soap and water prior to dressing change. Cleanser: Wound Cleanser 1 x Per Day/30 Days Discharge Instructions: Cleanse the wound with wound cleanser prior to applying a clean dressing using gauze sponges, not tissue or cotton balls. Peri-Wound Care: Skin Prep (DME) (Generic) 1 x Per Day/30 Days Discharge Instructions: Use skin prep as directed Prim Dressing: Santyl Ointment 1 x Per Day/30 Days ary Discharge Instructions: Apply nickel thick amount to wound bed as instructed Secondary  Dressing: Zetuvit Plus Silicone Border Dressing 4x4 (in/in) (DME) (Generic) 1 x Per Day/30 Days Discharge Instructions: Apply silicone border over primary dressing as directed. Laboratory naerobe culture (MICRO) - Left Plantar foot - (ICD10 L97.425 - Non-pressure chronic ulcer of Bacteria identified in Unspecified specimen by A left heel and midfoot with muscle involvement without evidence of necrosis) LOINC Code: [id number] Convenience Name: Anaerobic culture Electronic Signature(s) Signed: 06/28/2022 4:59:48 PM By: Lauren Catholic RN Signed: 06/28/2022 5:09:54 PM By: Lauren Maudlin MD FACS Previous Signature: 06/28/2022 1:37:22 PM Version By: Lauren Maudlin MD FACS Previous Signature: 06/28/2022 1:26:09 PM Version By: Lauren Maudlin MD FACS Entered By: Lauren [name] on 06/28/2022 16:56:33 Prescription 06/28/2022 -------------------------------------------------------------------------------- Lauren Leader MD Patient Name: Provider: 06/18/1980 [id number] Date of Birth: NPI#Wanda Plump [id number] Sex: DEA #: [id number] Phone #: License #: [place] Patient Address: [address]  [place], [place] [address] [place], [place] [address] [phone] Lauren [name], Lauren [name] ([id number]) [id number] Page 4 of 9 Allergies Wellbutrin; cefepime; ciprofloxacin; adhesive Provider's Orders naerobe culture - ICD10: L97.425 - Left Plantar foot Bacteria identified in Unspecified specimen by A LOINC Code: [id number] Convenience Name: Anaerobic culture Hand Signature: Date(s): Electronic Signature(s) Signed: 06/28/2022 4:59:48 PM By: Lauren Catholic RN Signed: 06/28/2022 5:09:54 PM By: Lauren Maudlin MD FACS Previous Signature: 06/28/2022 1:43:05 PM Version By: Lauren Maudlin MD FACS Previous Signature: 06/28/2022 1:26:09 PM Version By: Lauren Maudlin MD FACS Entered By: Lauren [name] on 06/28/2022 16:56:33 -------------------------------------------------------------------------------- Problem List Details Patient Name: Date of Service: Lauren [name], Lauren [name] 06/28/2022 12:45 PM Medical Record Number: [id number] Patient Account Number: [id number] Date of Birth/Sex: Treating RN: [birth year] (42 y.o. 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 05/31/2022 No Yes without evidence of necrosis E10.622 Type 1 diabetes mellitus with other skin ulcer 05/21/2022 No Yes N18.4 Chronic kidney disease, stage 4 (severe) 05/21/2022 No Yes I50.42 Chronic combined systolic (congestive) and diastolic (congestive) heart failure 05/21/2022 No Yes E66.9 Obesity, unspecified 05/21/2022 No Yes I63.9 Cerebral infarction, unspecified 05/21/2022 No Yes Inactive Problems Resolved Problems ICD-10 Code Description Active Date Resolved Date L98.499 Non-pressure chronic ulcer of skin of other sites with unspecified severity 05/21/2022 05/21/2022 Lauren [name] ([id number]) [id number] Page 5 of 9 Electronic Signature(s) Signed: 06/28/2022 1:33:29 PM By: Lauren Maudlin MD FACS Entered By: Lauren [name] on 06/28/2022 13:33:29 -------------------------------------------------------------------------------- Progress Note Details Patient Name: Date of Service: Lauren [name], Lauren [name] 06/28/2022 12:45 PM Medical Record Number: [id number] Patient Account Number: [id number] Date of Birth/Sex: Treating RN: 1980/06/07 (42 y.o. 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 She has been on Lovenox injections and also wears cutaneous blood sugar monitor and insulin pump. Apparently about a month ago, she developed a small lump under the skin that subsequently ruptured. This is left a small ulcer in her left lower abdomen. Her primary care provider has had her applying Neosporin and Band-Aids. According to the patient and her daughter, there is really been no change or improvement since that time. 05/31/2022: The wound on her abdomen has a layer of eschar on it. Her podiatrist, Dr. Sherryle [name], has also asked [name] to evaluate the wound on her foot. There is slough and eschar present. Apparently they had been using Iodosorb but recently changed to [facility]. 06/28/2022: Since our last visit, the patient was seen by Lauren [name] in vascular surgery. She underwent an angiogram with angioplasty and stenting to restore inline flow to her foot. She has now been optimized from a vascular standpoint. She has been using Santyl on her foot. The wound is a little bit larger today and there is a fair amount of nonviable tissue present. No purulent drainage. Patient History  Information obtained from Patient, Caregiver, Chart. Family History Cancer - Paternal Grandparents, Diabetes - Child,Siblings, Heart Disease - Mother,Father,Maternal Grandparents,Paternal Grandparents, Hypertension - Mother,Father, Lung Disease - Mother,Siblings, Stroke - Mother, Thyroid Problems - Mother, No  family history of Hereditary Spherocytosis, Kidney Disease, Seizures, Tuberculosis. Social History Former smoker - quit 01/2022, Marital Status - Married, Alcohol Use - Never, Drug Use - No History, Caffeine Use - Rarely. Medical History Eyes Denies history of Cataracts, Glaucoma, Optic Neuritis Ear/Nose/Mouth/Throat Denies history of Chronic sinus problems/congestion, Middle ear problems Hematologic/Lymphatic Patient has history of Anemia Cardiovascular Patient has history of Congestive Heart Failure, Coronary Artery Disease, Hypertension, Myocardial Infarction Denies history of Peripheral Arterial Disease Endocrine Patient has history of Type I Diabetes Genitourinary Denies history of End Stage Renal Disease Integumentary (Skin) Denies history of History of Burn Musculoskeletal Patient has history of Osteomyelitis - left foot Neurologic Patient has history of Neuropathy Oncologic Denies history of Received Chemotherapy, Received Radiation Psychiatric Denies history of Anorexia/bulimia, Confinement Anxiety Hospitalization/Surgery History - thrombectomy right brachial artery. - left 5th toe ray amputation. - cholecystectomy. - tubal ligation. - cardiac cath. Medical A Surgical History Notes nd Constitutional Symptoms (General Health) vitamin D deficiency Ear/Nose/Mouth/Throat frequent nose bleeds Respiratory hx pneumonia Cardiovascular hx thrombus right arm, right carotid artery occlusion, ischemic cardiomyopathy, hyperlipidemia Lauren [name], Lauren [name] ([id number]) [phone].pdf Page 6 of 9 Endocrine hypothyroidism Genitourinary CKD stage 4 Neurologic CVA, left hemiplegia Objective Constitutional Slightly tachycardic. no acute distress. Vitals Time Taken: 12:45 PM, Height: 65 in, Weight: 201 lbs, BMI: 33.4, Temperature: 97.4 F, Pulse: 109 bpm, Respiratory Rate: 18 breaths/min, Blood Pressure: 136/86 mmHg, Capillary Blood Glucose: 144  mg/dl. Respiratory Normal work of breathing on room air. General Notes: 06/28/2022: The wound is a little bit larger today and there is a fair amount of nonviable tissue present. No purulent drainage. Integumentary (Hair, Skin) Wound #2 status is Open. Original cause of wound was Gradually Appeared. The date acquired was: 03/03/2022. The wound has been in treatment 4 weeks. The wound is located on the [place]. The wound measures 1.6cm length x 1.9cm width x 0.5cm depth; 2.388cm^2 area and 1.194cm^3 volume. There is Fat Layer (Subcutaneous Tissue) exposed. There is no tunneling or undermining noted. There is a medium amount of serosanguineous drainage noted. The wound margin is distinct with the outline attached to the wound base. There is small (1-33%) pink granulation within the wound bed. There is a large (67-100%) amount of necrotic tissue within the wound bed including Eschar and Adherent Slough. The periwound skin appearance had no abnormalities noted for texture. The periwound skin appearance had no abnormalities noted for color. The periwound skin appearance exhibited: Dry/Scaly. Periwound temperature was noted as No Abnormality. 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 There was a Excisional Skin/Subcutaneous Tissue Debridement with a total area of 3.04 sq cm performed by Lauren Maudlin, MD. With the following instrument(s): Curette to remove Non-Viable  tissue/material. Material removed includes Subcutaneous Tissue and Slough and after achieving pain control using Lidocaine 5% topical ointment. 1 specimen was taken by a Tissue Culture and sent to the lab per  facility protocol. A time out was conducted at 13:05, prior to the start of the procedure. A Minimum amount of bleeding was controlled with Pressure. The procedure was tolerated well with a pain level of 0 throughout and a pain level of 0 following the procedure. Post Debridement Measurements: 1.6cm length x 1.9cm width x 0.5cm depth; 1.194cm^3 volume. Character of Wound/Ulcer Post Debridement is improved. Severity of Tissue Post Debridement is: Fat layer exposed. Post procedure Diagnosis Wound #2: Same as Pre-Procedure General Notes: Scribed for Dr. Celine [name] by Lauren. Plan Follow-up Appointments: Return Appointment in 1 week. - Dr. Celine [name] Room 3 Monday 07/05/22 at 3:45pm Anesthetic: (In clinic) Topical Lidocaine 5% applied to wound bed Bathing/ Shower/ Hygiene: May shower and wash wound with soap and water. Laboratory ordered were: Anaerobic culture - Left Plantar foot Lauren [name], Lauren [name] ([id number]) [id number] Page 7 of 9 WOUND #2: - Foot Wound Laterality: Plantar, Left Cleanser: Soap and Water 1 x Per Day/30 Days Discharge Instructions: May shower and wash wound with dial antibacterial soap and water prior to dressing change. Cleanser: Wound Cleanser 1 x Per Day/30 Days Discharge Instructions: Cleanse the wound with wound cleanser prior to applying a clean dressing using gauze sponges, not tissue or cotton balls. Peri-Wound Care: Skin Prep (DME) (Generic) 1 x Per Day/30 Days Discharge Instructions: Use skin prep as directed Prim Dressing: Santyl Ointment 1 x Per Day/30 Days ary Discharge Instructions: Apply nickel thick amount to wound bed as instructed Secondary Dressing: Zetuvit Plus Silicone Border Dressing 4x4 (in/in) (DME) (Generic) 1 x Per Day/30  Days Discharge Instructions: Apply silicone border over primary dressing as directed. 06/28/2022: The wound is a little bit larger today and there is a fair amount of nonviable tissue present. No purulent drainage. I used a curette to debride slough and nonviable subcutaneous tissue from the wound. Based upon the appearance and odor reported by the intake nurse, I did take a culture. I will await culture data before initiating any antibiotic therapy, however. We will continue Santyl daily dressing changes with foam border dressings. Follow-up in 1 week. Electronic Signature(s) Signed: 07/02/2022 3:08:51 PM By: Deon Pilling RN, BSN Signed: 07/02/2022 3:35:49 PM By: Lauren Maudlin MD FACS Previous Signature: 06/28/2022 1:38:21 PM Version By: Lauren Maudlin MD FACS Entered By: Deon Pilling on 07/02/2022 14:58:48 -------------------------------------------------------------------------------- HxROS Details Patient Name: Date of Service: Lauren [name], Lauren [name] 06/28/2022 12:45 PM Medical Record Number: [id number] Patient Account Number: [id number] Date of Birth/Sex: Treating RN: 1980-11-01 (42 y.o. F) Primary Care Provider: Marland Kitchen Other Clinician: Referring Provider: Treating Provider/Extender: Lauren [name] in Treatment: 5 Information Obtained From Patient Caregiver Chart Constitutional Symptoms (General Health) Medical History: Past Medical History Notes: vitamin D deficiency Eyes Medical History: Negative for: Cataracts; Glaucoma; Optic Neuritis Ear/Nose/Mouth/Throat Medical History: Negative for: Chronic sinus problems/congestion; Middle ear problems Past Medical History Notes: frequent nose bleeds Hematologic/Lymphatic Medical History: Positive for: Anemia Respiratory Medical History: Past Medical History Notes: hx pneumonia Cardiovascular Medical History: Positive for: Congestive Heart Failure; Coronary Artery Disease; Hypertension; Myocardial  Infarction Negative for: Peripheral Arterial Disease Past Medical History Notes: hx thrombus right arm, right carotid artery occlusion, ischemic cardiomyopathy, hyperlipidemia Lauren [name], Lauren [name] ([id number]) [id number] Page 8 of 9 Endocrine Medical History: Positive for: Type I Diabetes Past Medical History Notes: hypothyroidism Time with diabetes: 12 yrs Treated with: Insulin Blood sugar tested every day: Yes Tested : has dexcom Genitourinary Medical History: Negative for: End Stage Renal Disease Past Medical History Notes: CKD stage 4 Integumentary (Skin) Medical History: Negative for: History  of Burn Musculoskeletal Medical History: Positive for: Osteomyelitis - left foot Neurologic Medical History: Positive for: Neuropathy Past Medical History Notes: CVA, left hemiplegia Oncologic Medical History: Negative for: Received Chemotherapy; Received Radiation Psychiatric Medical History: Negative for: Anorexia/bulimia; Confinement Anxiety Immunizations Pneumococcal Vaccine: Received Pneumococcal Vaccination: No Implantable Devices None Hospitalization / Surgery History Type of Hospitalization/Surgery thrombectomy right brachial artery left 5th toe ray amputation cholecystectomy tubal ligation cardiac cath Family and Social History Cancer: Yes - Paternal Grandparents; Diabetes: Yes - Child,Siblings; Heart Disease: Yes - Mother,Father,Maternal Grandparents,Paternal Grandparents; Hereditary Spherocytosis: No; Hypertension: Yes - Mother,Father; Kidney Disease: No; Lung Disease: Yes - Mother,Siblings; Seizures: No; Stroke: Yes - Mother; Thyroid Problems: Yes - Mother; Tuberculosis: No; Former smoker - quit 01/2022; Marital Status - Married; Alcohol Use: Never; Drug Use: No History; Caffeine Use: Rarely; Financial Concerns: No; Food, Clothing or Shelter Needs: No; Support System Lacking: No; Transportation Concerns: No Electronic  Signature(s) Signed: 06/28/2022 1:43:05 PM By: Lauren Maudlin MD FACS Entered By: Lauren [name] on 06/28/2022 13:35:19 -------------------------------------------------------------------------------- SuperBill Details Patient Name: Date of Service: ZUHUR, BORGES 06/28/2022 Lauren [name] ([id number]) [id number] Page 9 of 9 Medical Record Number: [id number] Patient Account Number: [id number] Date of Birth/Sex: Treating RN: 08-23-1980 (42 y.o. 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 of necrosis E10.622 Type 1 diabetes mellitus with other skin ulcer N18.4 Chronic kidney disease, stage 4 (severe) I50.42 Chronic combined systolic (congestive) and diastolic (congestive) heart failure E66.9 Obesity, unspecified I63.9 Cerebral infarction, unspecified Facility Procedures : CPT4 Code: [id number] Description: [id number] - DEB SUBQ TISSUE 20 SQ CM/< ICD-10 Diagnosis Description 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 wi Modifier: thout evidence of ne Quantity: 1 crosis Physician Procedures : CPT4 Code Description Modifier [id number] - WC PHYS LEVEL 4 - EST PT 25 ICD-10 Diagnosis Description 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

## 2022-06-29 NOTE — Progress Notes (Signed)
  Care Coordination Note  06/29/2022 Name: Lauren [name] MRN: [id number] DOB: 08/19/1980  Lauren [name] is a 42 y.o. year old female who is a primary care patient of Johnette Abraham, MD and is actively engaged with the care management team. I reached out to [name] by phone today to assist with re-scheduling a follow up visit with the Licensed Clinical Social Worker  Follow up plan: Unsuccessful telephone outreach attempt made. We have been unable to make contact with the patient for follow up.   [place]  Direct Dial: [phone]

## 2022-06-29 NOTE — Progress Notes (Signed)
[name], [name] ([id number]) [id number] Page 1 of 6 Visit Report for 06/28/2022 Arrival Information Details Patient Name: Date of Service: Lauren [name], Lauren [name] 06/28/2022 12:45 PM Medical Record Number: [id number] Patient Account Number: [id number] Date of Birth/Sex: Treating RN: 1980/07/18 (42 y.o. Lauren [name] Primary Care Louvinia Cumbo: Lauren [name] Kitchen Other Clinician: Referring Lauren [name]: Treating Lauren [name]/Extender: Lauren [name] in Treatment: 5 Visit Information History Since Last Visit Added or deleted any medications: No Patient Arrived: Ambulatory Any new allergies or adverse reactions: No Arrival Time: 12:43 Had a fall or experienced change in No Accompanied By: husband activities of daily living that may affect Transfer Assistance: None risk of falls: Patient Identification Verified: Yes Signs or symptoms of abuse/neglect since last visito No Patient Requires Transmission-Based Precautions: No Hospitalized since last visit: No Patient Has Alerts: Yes Implantable device outside of the clinic excluding No Patient Alerts: Patient on Blood Thinner cellular tissue based products placed in the center On Warfarin since last visit: Has Dressing in Place as Prescribed: Yes Pain Present Now: Yes Electronic Signature(s) Signed: 06/28/2022 4:59:48 PM By: Lauren Catholic RN Entered By: Lauren [name] on 06/28/2022 13:05:31 -------------------------------------------------------------------------------- Encounter Discharge Information Details Patient Name: Date of Service: Lauren [name], Lauren [name] 06/28/2022 12:45 PM Medical Record Number: [id number] Patient Account Number: [id number] Date of Birth/Sex: Treating RN: 11-01-1980 (42 y.o. Lauren [name] Primary Care Lauren [name]: Lauren [name] Kitchen Other Clinician: Referring Lauren [name]: Treating Lauren [name]/Extender: Lauren [name] in Treatment: 5 Encounter Discharge Information  Items Post Procedure Vitals Discharge Condition: Stable Temperature (F): 97.4 Ambulatory Status: Ambulatory Pulse (bpm): 109 Discharge Destination: Home Respiratory Rate (breaths/min): 18 Transportation: Other Blood Pressure (mmHg): 136/86 Accompanied By: spouse Schedule Follow-up Appointment: Yes Clinical Summary of Care: Patient Declined Electronic Signature(s) Signed: 06/28/2022 4:59:48 PM By: Lauren Catholic RN Entered By: Lauren [name] on 06/28/2022 16:57:39 -------------------------------------------------------------------------------- Lower Extremity Assessment Details Patient Name: Date of Service: Lauren [name], Lauren [name] 06/28/2022 12:45 PM Medical Record Number: [id number] Patient Account Number: [id number] Date of Birth/Sex: Treating RN: 08/02/1980 (42 y.o. Lauren [name] Primary Care Lauren [name]: Lauren [name] Kitchen Other Clinician: Referring Lauren [name]: Treating Lauren [name]/Extender: Lauren [name] in Treatment: 5 Electronic Signature(s) Signed: 06/28/2022 4:59:48 PM By: Lauren Catholic RN Spero [name], Lauren ([id number]) [id number] Page 2 of 6 Entered By: Lauren [name] on 06/28/2022 12:47:52 -------------------------------------------------------------------------------- Multi Wound Chart Details Patient Name: Date of Service: Lauren [name], Lauren [name] 06/28/2022 12:45 PM Medical Record Number: [id number] Patient Account Number: [id number] Date of Birth/Sex: Treating RN: April 28, 1981 (42 y.o. F) Primary Care Lauren [name]: Lauren [name] Kitchen Other Clinician: Referring Lauren [name]: Treating Lauren [name]/Extender: Lauren [name] in Treatment: 5 Vital Signs Height(in): 65 Capillary Blood Glucose(mg/dl): 144 Weight(lbs): 201 Pulse(bpm): 109 Body Mass Index(BMI): 33.4 Blood Pressure(mmHg): 136/86 Temperature(F): 97.4 Respiratory Rate(breaths/min): 18 [2:Photos:] [N/A:N/A] Left, Plantar Foot N/A N/A Wound Location: Gradually  Appeared N/A N/A Wounding Event: Diabetic Wound/Ulcer of the Lower N/A N/A Primary Etiology: Extremity Anemia, Congestive Heart Failure, N/A N/A Comorbid History: Coronary Artery Disease, Hypertension, Myocardial Infarction, Type I Diabetes, Osteomyelitis, Neuropathy 03/03/2022 N/A N/A Date Acquired: 4 N/A N/A Weeks of Treatment: Open N/A N/A Wound Status: No N/A N/A Wound Recurrence: 1.6x1.9x0.5 N/A N/A Measurements L x W x D (cm) 2.388 N/A N/A A (cm) : rea 1.194 N/A N/A Volume (cm) : 15.50% N/A N/A % Reduction in A rea: -321.90% N/A N/A % Reduction in Volume: Grade 1 N/A N/A Classification: Medium N/A N/A Exudate A mount: Serosanguineous N/A N/A Exudate Type: red, [name] N/A N/A Exudate Color: Distinct, outline attached N/A N/A Wound Margin: Small (1-33%) N/A N/A  Granulation A mount: Pink N/A N/A Granulation Quality: Large (67-100%) N/A N/A Necrotic A mount: Eschar, Adherent Slough N/A N/A Necrotic Tissue: Fat Layer (Subcutaneous Tissue): Yes N/A N/A Exposed Structures: Fascia: No Tendon: No Muscle: No Joint: No Bone: No None N/A N/A Epithelialization: Debridement - Excisional N/A N/A Debridement: Pre-procedure Verification/Time Out 13:05 N/A N/A Taken: Lidocaine 5% topical ointment N/A N/A Pain Control: Subcutaneous, Slough N/A N/A Tissue Debrided: Skin/Subcutaneous Tissue N/A N/A Level: 3.04 N/A N/A Debridement A (sq cm): rea Curette N/A N/A Instrument: Minimum N/A N/A Bleeding: Pressure N/A N/A Hemostasis A chieved: 0 N/A N/A Procedural Pain: 0 N/A N/A Post Procedural Pain: Procedure was tolerated well N/A N/A Debridement Treatment Response: 1.6x1.9x0.5 N/A N/A Post Debridement Measurements L x W x D (cm) Lauren [name] ([id number]) [id number] Page 3 of 6 1.194 N/A N/A Post Debridement Volume: (cm) No Abnormalities Noted N/A N/A Periwound Skin Texture: Dry/Scaly: Yes N/A N/A Periwound Skin  Moisture: No Abnormalities Noted N/A N/A Periwound Skin Color: No Abnormality N/A N/A Temperature: Debridement N/A N/A Procedures Performed: Treatment Notes Electronic Signature(s) Signed: 06/28/2022 1:33:39 PM By: Fredirick Maudlin MD FACS Entered By: Fredirick Maudlin on 06/28/2022 13:33:39 -------------------------------------------------------------------------------- Multi-Disciplinary Care Plan Details Patient Name: Date of Service: Lauren [name], Lauren [name] 06/28/2022 12:45 PM Medical Record Number: [id number] Patient Account Number: [id number] Date of Birth/Sex: Treating RN: [birth year] (42 y.o. Lauren [name] Primary Care Graciela Plato: Lauren [name] Kitchen Other Clinician: Referring Dawsen Krieger: Treating Athanasia Stanwood/Extender: Lauren [name] in Treatment: 5 Multidisciplinary Care Plan reviewed with physician Active Inactive Nutrition Nursing Diagnoses: Impaired glucose control: actual or potential Potential for alteratiion in Nutrition/Potential for imbalanced nutrition Goals: Patient/caregiver will maintain therapeutic glucose control Date Initiated: 05/21/2022 Target Resolution Date: 08/31/2022 Goal Status: Active Interventions: Assess HgA1c results as ordered upon admission and as needed Assess patient nutrition upon admission and as needed per policy Provide education on elevated blood sugars and impact on wound healing Treatment Activities: Patient referred to Primary Care Physician for further nutritional evaluation : 05/21/2022 Notes: Wound/Skin Impairment Nursing Diagnoses: Impaired tissue integrity Knowledge deficit related to ulceration/compromised skin integrity Goals: Patient/caregiver will verbalize understanding of skin care regimen Date Initiated: 05/21/2022 Target Resolution Date: 08/31/2022 Goal Status: Active Ulcer/skin breakdown will have a volume reduction of 30% by week 4 Date Initiated: 05/21/2022 Target Resolution Date: 08/31/2022 Goal Status:  Active Interventions: Assess patient/caregiver ability to obtain necessary supplies Assess patient/caregiver ability to perform ulcer/skin care regimen upon admission and as needed Assess ulceration(s) every visit Provide education on ulcer and skin care Treatment Activities: Skin care regimen initiated : 05/21/2022 Topical wound management initiated : 05/21/2022 Lauren [name] ([id number]) [phone].pdf Page 4 of 6 Notes: Electronic Signature(s) Signed: 06/28/2022 4:59:48 PM By: Lauren Catholic RN Entered By: Lauren [name] on 06/28/2022 16:56:44 -------------------------------------------------------------------------------- Pain Assessment Details Patient Name: Date of Service: Lauren [name], Lauren [name] 06/28/2022 12:45 PM Medical Record Number: [id number] Patient Account Number: [id number] Date of Birth/Sex: Treating RN: October 17, 1980 (42 y.o. Lauren [name] Primary Care Naisha Wisdom: Lauren [name] Kitchen Other Clinician: Referring Carlous Olivares: Treating Jolly Bleicher/Extender: Lauren [name] in Treatment: 5 Active Problems Location of Pain Severity and Description of Pain Patient Has Paino Yes Site Locations Pain Location: Generalized Pain With Dressing Change: No Duration of the Pain. Constant / Intermittento Constant Rate the pain. Current Pain Level: 6 Worst Pain Level: 10 Least Pain Level: 4 Tolerable Pain Level: 4 Character of Pain Describe the Pain: Difficult to Pinpoint Pain Management and Medication Current Pain Management: Medication: No Cold Application: No Rest: Yes Massage: No Activity: No  T.E.N.S.: No Heat Application: No Leg drop or elevation: No Is the Current Pain Management Adequate: Inadequate How does your wound impact your activities of daily livingo Sleep: No Bathing: No Appetite: No Relationship With Others: No Bladder Continence: No Emotions: No Bowel Continence: No Work: No Toileting: No Drive: No Dressing:  No Hobbies: No Electronic Signature(s) Signed: 06/28/2022 4:59:48 PM By: Lauren Catholic RN Entered By: Lauren [name] on 06/28/2022 12:47:28 -------------------------------------------------------------------------------- Patient/Caregiver Education Details Patient Name: Date of Service: Lauren [name], Lauren [name] 2/26/2024andnbsp12:45 PM Medical Record Number: [id number] Patient Account Number: [id number] Date of Birth/Gender: Treating RN: [birth year] (42 y.o. Lauren [name] Primary Care Physician: Lauren [name] Kitchen Other Clinician: Alene [name] ([id number]) [id number] Page 5 of 6 Referring Physician: Treating Physician/Extender: Lauren [name] in Treatment: 5 Education Assessment Education Provided To: Patient Education Topics Provided Wound/Skin Impairment: Methods: Explain/Verbal Responses: Return demonstration correctly Electronic Signature(s) Signed: 06/28/2022 4:59:48 PM By: Lauren Catholic RN Entered By: Lauren [name] on 06/28/2022 16:53:55 -------------------------------------------------------------------------------- Wound Assessment Details Patient Name: Date of Service: Lauren [name], Lauren [name] 06/28/2022 12:45 PM Medical Record Number: [id number] Patient Account Number: [id number] Date of Birth/Sex: Treating RN: [birth year] (42 y.o. Lauren [name] Primary Care Maika Kaczmarek: Lauren [name] Kitchen Other Clinician: Referring Dezra Mandella: Treating Vivi Piccirilli/Extender: Lauren [name] in Treatment: 5 Wound Status Wound Number: 2 Primary Diabetic Wound/Ulcer of the Lower Extremity Etiology: Wound Location: Left, Plantar Foot Wound Open Wounding Event: Gradually Appeared Status: Date Acquired: 03/03/2022 Comorbid Anemia, Congestive Heart Failure, Coronary Artery Disease, Weeks Of Treatment: 4 History: Hypertension, Myocardial Infarction, Type I Diabetes, Clustered Wound: No Osteomyelitis, Neuropathy Photos Wound  Measurements Length: (cm) 1.6 Width: (cm) 1.9 Depth: (cm) 0.5 Area: (cm) 2.388 Volume: (cm) 1.194 % Reduction in Area: 15.5% % Reduction in Volume: -321.9% Epithelialization: None Tunneling: No Undermining: No Wound Description Classification: Grade 1 Wound Margin: Distinct, outline attached Exudate Amount: Medium Exudate Type: Serosanguineous Exudate Color: red, [name] Foul Odor After Cleansing: No Slough/Fibrino Yes Wound Bed Granulation Amount: Small (1-33%) Exposed Structure Granulation Quality: Pink Fascia Exposed: No Necrotic Amount: Large (67-100%) Fat Layer (Subcutaneous Tissue) Exposed: Yes Necrotic Quality: [place], Adherent Slough Tendon Exposed: No Lauren [name], Lauren [name] ([id number]) [id number] Page 6 of 6 Muscle Exposed: No Joint Exposed: No Bone Exposed: No Periwound Skin Texture Texture Color No Abnormalities Noted: Yes No Abnormalities Noted: Yes Moisture Temperature / Pain No Abnormalities Noted: No Temperature: No Abnormality Dry / Scaly: Yes Treatment Notes Wound #2 (Foot) Wound Laterality: Plantar, Left Cleanser Soap and Water Discharge Instruction: May shower and wash wound with dial antibacterial soap and water prior to dressing change. Wound Cleanser Discharge Instruction: Cleanse the wound with wound cleanser prior to applying a clean dressing using gauze sponges, not tissue or cotton balls. Peri-Wound Care Skin Prep Discharge Instruction: Use skin prep as directed Topical Primary Dressing Santyl Ointment Discharge Instruction: Apply nickel thick amount to wound bed as instructed Secondary Dressing Zetuvit Plus Silicone Border Dressing 4x4 (in/in) Discharge Instruction: Apply silicone border over primary dressing as directed. Secured With Compression Wrap Compression Stockings Environmental education officer) Signed: 06/28/2022 4:59:48 PM By: Lauren Catholic RN Entered By: Lauren [name] on 06/28/2022  12:56:58 -------------------------------------------------------------------------------- Vitals Details Patient Name: Date of Service: Lauren [name], Lauren [name] 06/28/2022 12:45 PM Medical Record Number: [id number] Patient Account Number: [id number] Date of Birth/Sex: Treating RN: Sep 06, 1980 (42 y.o. Lauren [name] Primary Care Quinnetta Roepke: Lauren [name] Kitchen Other Clinician: Referring Guiseppe Flanagan: Treating Kalan Rinn/Extender: Lauren [name] in Treatment: 5 Vital Signs Time Taken: 12:45 Temperature (F): 97.4 Height (in): 65 Pulse (bpm): 109 Weight (  lbs): 201 Respiratory Rate (breaths/min): 18 Body Mass Index (BMI): 33.4 Blood Pressure (mmHg): 136/86 Capillary Blood Glucose (mg/dl): 144 Reference Range: 80 - 120 mg / dl Electronic Signature(s) Signed: 06/28/2022 4:59:48 PM By: Lauren Catholic RN Entered By: Lauren [name] on 06/28/2022 13:05:44

## 2022-06-30 ENCOUNTER — Ambulatory Visit (HOSPITAL_COMMUNITY): Payer: BC Managed Care – PPO

## 2022-06-30 DIAGNOSIS — I63511 Cerebral infarction due to unspecified occlusion or stenosis of right middle cerebral artery: Secondary | ICD-10-CM

## 2022-06-30 DIAGNOSIS — R262 Difficulty in walking, not elsewhere classified: Secondary | ICD-10-CM | POA: Diagnosis not present

## 2022-06-30 DIAGNOSIS — R29898 Other symptoms and signs involving the musculoskeletal system: Secondary | ICD-10-CM | POA: Diagnosis not present

## 2022-06-30 DIAGNOSIS — I639 Cerebral infarction, unspecified: Secondary | ICD-10-CM | POA: Diagnosis not present

## 2022-06-30 NOTE — Progress Notes (Signed)
  Subjective:  Patient ID: Lauren [name], female    DOB: 1981/04/09,  MRN: [id number]  Chief Complaint  Patient presents with   Diabetic Ulcer    4 week follow up left midfoot - patient went to wound care yesterday and they sent a culture - they did not start her on antibiotics    42 y.o. female presents with the above complaint. History confirmed with patient.  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  She has  ulceration measuring 1.5 x 1.5 x 0.5 cm on the fifth metatarsal base with surrounding erythema no drainage no exposed bone tendon or joint.  Excellent granulation tissue some surrounding hyperkeratosis much improved since last examination   Assessment:   1. 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   Ulcer left foot -Doing much better and progressing after her most recent angiography and intervention.  She is still seeing wound care weekly.  I did not debride the lesion today.  I discussed with her that I would like to plan for a graft application and debridement the operating room with [facility] application in 4 weeks if she is not improving by that point.  This was scheduled today.  All questions addressed.  Informed consent was signed and reviewed.  Surgical be scheduled for the first week of April, I discussed with her if the wound heals before then we can cancel this and continue local wound care          No follow-ups on file.

## 2022-06-30 NOTE — Therapy (Signed)
OUTPATIENT PHYSICAL THERAPY NEURO TREATMENT   Patient Name: Lynzi Phoebus MRN: [id number] DOB:07/19/1980, 42 y.o., female Today's Date: 06/30/2022   PCP: Johnette Abraham, MD  REFERRING PROVIDER: Jennye Boroughs, MD  END OF SESSION:  PT End of Session - 06/30/22 0947     Visit Number 2    Number of Visits 16    Date for PT Re-Evaluation 08/18/22    Authorization Type BC[employer]    Authorization - Visit Number 2    Authorization - Number of Visits 30    Progress Note Due on Visit 8    PT Start Time 0950    PT Stop Time 1030    PT Time Calculation (min) 40 min    Equipment Utilized During Treatment Gait belt    Activity Tolerance Patient tolerated treatment well    Behavior During Therapy WFL for tasks assessed/performed            Past Medical History:  Diagnosis Date   Anemia    CAD (coronary artery disease)    a. s/p cath in 03/2014 showing 30% mid-LAD, moderate to severe disease along small D1, patent LCx, moderate to severe distal OM2 stenosis and moderate diffuse diease along RCA not amenable to PCI   CHF (congestive heart failure) ([place])    a. EF 55-60% in 12/2019 b. EF at 35-40% by echo in 05/2020   CKD (chronic kidney disease) stage 4, GFR 15-29 ml/min (HCC)    Diabetes mellitus without complication ([place])    Myocardial infarction ([place])    Neuropathy    Stroke [facility])    Past Surgical History:  Procedure Laterality Date   ABDOMINAL AORTOGRAM W/LOWER EXTREMITY N/A 06/10/2022   Procedure: ABDOMINAL AORTOGRAM W/LOWER EXTREMITY;  Surgeon: Marty Heck, MD;  Location: [place] [facility];  Service: Cardiovascular;  Laterality: N/A;   AMPUTATION Left 09/02/2021   Procedure: AMPUTATION RAY;  Surgeon: Criselda Peaches, DPM;  Location: [place];  Service: Podiatry;  Laterality: Left;  sagittal saw, 3L bag saline & Pulse   Cardiac catherization     CHOLECYSTECTOMY     I & D EXTREMITY Left 08/30/2021   Procedure: IRRIGATION AND DEBRIDEMENT WITH BONE BIOPSY;  Surgeon:  [name], DPM;  Location: [place] [facility];  Service: Podiatry;  Laterality: Left;   IR ANGIO VERTEBRAL SEL SUBCLAVIAN INNOMINATE UNI L MOD SED  01/18/2022   IR CT HEAD LTD  01/08/2022   IR INTRA CRAN STENT  01/08/2022   IR PERCUTANEOUS ART THROMBECTOMY/INFUSION INTRACRANIAL INC DIAG ANGIO  01/08/2022   IRRIGATION AND DEBRIDEMENT FOOT Left 09/04/2021   Procedure: IRRIGATION AND DEBRIDEMENT FOOT AND CLOSURE;  Surgeon: Criselda Peaches, DPM;  Location: [facility];  Service: Podiatry;  Laterality: Left;   PERIPHERAL VASCULAR BALLOON ANGIOPLASTY Left 06/10/2022   Procedure: PERIPHERAL VASCULAR BALLOON ANGIOPLASTY;  Surgeon: Marty Heck, MD;  Location: [facility];  Service: Cardiovascular;  Laterality: Left;  AT and DP   PERIPHERAL VASCULAR INTERVENTION Left 06/10/2022   Procedure: PERIPHERAL VASCULAR INTERVENTION;  Surgeon: Marty Heck, MD;  Location: [place] [facility];  Service: Cardiovascular;  Laterality: Left;  SFA   RADIOLOGY WITH ANESTHESIA N/A 01/08/2022   Procedure: IR WITH ANESTHESIA;  Surgeon: Radiologist, Medication, MD;  Location: [facility];  Service: Radiology;  Laterality: N/A;   THROMBECTOMY BRACHIAL ARTERY Right 11/18/2021   Procedure: RIGHT BRACHIAL, RADIAL, & ULNAR ARTERY THROMBECTOMY.;  Surgeon: Marty Heck, MD;  Location: [facility];  Service: Vascular;  Laterality: Right;  TUBAL LIGATION     Patient Active Problem List   Diagnosis Date Noted   Critical limb ischemia of left lower extremity ([place]) 06/01/2022   Open abdominal wall wound, initial encounter 04/28/2022   Insomnia 04/07/2022   Encounter for therapeutic drug monitoring 04/07/2022   Ulcer of left foot due to type 2 diabetes mellitus ([place])    Acute on chronic combined systolic and diastolic CHF (congestive heart failure) ([place])    Ischemic cardiomyopathy    Acute right MCA stroke ([place]) 01/28/2022   Elevated troponin    Aspiration pneumonia ([place]) 01/18/2022   Acute respiratory failure with hypoxia (HCC)    HFrEF  (heart failure with reduced ejection fraction) ([place])    Right carotid artery occlusion 01/08/2022   Internal carotid artery occlusion, 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   Menorrhagia with irregular cycle 12/15/2021   Pregnancy examination or test, negative result 12/15/2021   Encounter for IUD insertion 12/15/2021   Critical ischemia of upper extremity ([place]) 11/18/2021   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 ([place]) 11/18/2021   Cellulitis of left foot 09/02/2021   Acute osteomyelitis of left foot ([place]) 09/02/2021   Foot ulcer ([place]) 08/27/2021   Sepsis ([place]) 08/27/2021   Hypokalemia 08/27/2021   Cellulitis 06/18/2021   Chronic combined systolic and diastolic heart failure ([place]) 06/18/2021   History of CVA (cerebrovascular accident) 06/18/2021   Encounter for cervical Pap smear with pelvic exam 07/23/2020   Menorrhagia with regular cycle 07/23/2020   Iron deficiency anemia due to chronic blood loss 07/23/2020   Menstrual cramps 07/23/2020   Class 2 obesity 06/18/2020   CAD (coronary artery disease) 06/18/2020   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 ([place]) 06/18/2020   Unstable angina ([place]) 06/18/2020   Mixed hyperlipidemia    Class 2 obesity due to excess calories without serious comorbidity with body mass index (BMI) of 39.0 to 39.9 in adult    Acute renal failure superimposed on stage 4 chronic kidney disease ([place]) 01/01/2020   Acute ischemic stroke ([place]) 01/01/2020   Tobacco abuse 01/01/2020   Chest pain 12/31/2019   Cerebrovascular accident (CVA) ([place]) 12/31/2019   Left sided numbness 12/31/2019   Vitamin D deficiency 07/24/2015   Primary hypertension 03/17/2015   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 Denies any recent falls.  EVAL: Patient presents to the clinic S/P CVA with c/o L LE weakness. Patient reports that at times, the LE feels like it's going to give out. Patient states that she tends to lean towards the L side when she walks. Patient is also concerned about weakness on the L UE.  Patient had CVA in 01/08/2022 and had PT in the hospital. Upon D/C, patient was sent to inpatient rehab for 3 weeks. After which, patient had home health PT for around 3 weeks (ended in November 2023). Since November, patient did not receive any rehabilitation services. Husband reports that patient had to go to the ER yesterday due to a nose bleed. Husband reports that the bleeding is just a result of the air being dry. Patient is currently on anticoagulants so she is at risk for bleeding. Husband and patient were advised at the ER to use a humidifier. Patient also has a hx of L 5th toe amputation and is currently having a pressure sore close to the amputated area. Due to the pressure sore, patient is instructed to wear the boot especially when she's weightbearing. Per husband, patient is allowed to be in FWB on the L LE. 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  Marland Kitchen Pt accompanied by:  husband  PERTINENT HISTORY: Stents on L LE, L 5th toe amputation, pressure sore close to the amputated area (L 5th toe)  PAIN:  Are you having pain? Yes: NPRS scale: 5/10 Pain location: L  calf Pain description: soreness Aggravating factors: cannot associate anything Relieving factors: Tylenol  PRECAUTIONS: pressure sore close to the amputated area  (L 5th toe). Needs to wear the boot on weight-bearing. Can be FWB, per husband during the eval.  WEIGHT BEARING RESTRICTIONS:  Needs to wear the boot on weight-bearing  FALLS: Has patient fallen in last 6 months? No  LIVING ENVIRONMENT: Lives with: lives with their family Lives in: Mobile home Has following equipment at home: Environmental consultant - 2 wheeled, Wheelchair (manual), Shower bench, and Ramped entry  PLOF: Independent  PATIENT GOALS: "to be able to use the hand back"  OBJECTIVE:   DIAGNOSTIC FINDINGS:  MRI brain without contrast 01/14/2022 FINDINGS: The examination had to be discontinued prior to completion due to patient condition. Axial and coronal diffusion, axial T2* gradient echo, and axial T2 FLAIR sequences were obtained. The FLAIR sequence is severely motion degraded.   Numerous patchy infarcts throughout the right MCA territory are unchanged in extent compared to the prior MRI, with the most confluent regions of infarct again noted to be near watershed areas. Small infarcts in the corpus callosum, right cerebral peduncle, and left frontal white matter are also unchanged. No definite new infarct or sizable intracranial hemorrhage is identified. There is no midline shift or hydrocephalus. Chronic white matter disease involving the cerebral hemispheres and brainstem are not well evaluated on this truncated, motion degraded study.   IMPRESSION: 1. Motion degraded, incomplete examination. 2. Unchanged numerous infarcts as above, primarily involving the right MCA territory. 3. No definite new intracranial abnormality.  COGNITION: Overall cognitive status: Within functional limits for tasks assessed Demonstrates unilat neglect on the L (only drew the numbers on the R side of the clock)  SENSATION: Light touch: Impaired on L LE  COORDINATION: Intact/WFL on Heel-to-shin on B  EDEMA: mild swelling on L UE. No swelling noted on  B LE   MUSCLE LENGTH: Mild tightness on  B hamstrings Moderate tightness on R gastrocsoleus (L not tested due to the presence of a boot)  POSTURE: rounded shoulders, forward head, and slight flexor synergy on L UE  LOWER EXTREMITY ROM:     Active  Right Eval Left Eval  Hip flexion [facility]  Hip extension [facility]  Hip abduction [facility]  Hip adduction    Hip internal rotation    Hip external rotation    Knee flexion [facility] WFL  Knee extension [facility]  Ankle dorsiflexion [facility] WFL  Ankle plantarflexion [facility] WFL  Ankle inversion    Ankle eversion     (Blank rows = not tested)  LOWER EXTREMITY MMT:    MMT Right Eval Left Eval  Hip flexion 5 4-  Hip extension 3+ 2-  Hip abduction 4+ 4-  Hip adduction    Hip internal rotation    Hip external rotation    Knee flexion 5 3+  Knee extension 5 3+  Ankle dorsiflexion 5   Ankle plantarflexion 5   Ankle inversion    Ankle eversion    (Blank rows = not tested)  BED MOBILITY:  Sit to supine Complete Independence Supine to sit Complete Independence Rolling to Right Complete Independence Rolling to Left Complete Independence  TRANSFERS: Assistive device utilized: None  Sit to stand: Complete Independence Stand to sit: Complete Independence  GAIT: Gait pattern: step to pattern, decreased arm swing- Right, decreased arm swing- Left, decreased step length- Right, decreased step length- Left, decreased stance time- Left, decreased hip/knee flexion- Left, Left hip hike, Left foot flat, lateral lean- Left, poor foot clearance- Right, and poor foot clearance- Left Distance walked: 287 ft Assistive device utilized: None Level of assistance: SBA Comments: doesn't walk on straight path and deviates to the L. Testing done with the boot  FUNCTIONAL TESTS:  5 times sit to stand: 9.86 sec 2 minute walk test: 287 ft Tinetti POMA: 13 (balance + gait)  PATIENT SURVEYS:  FOTO [id number]  TODAY'S TREATMENT:                                                                                                                               DATE:  06/30/22 Seated hamstring stretch x 30" x 3 Sit-to-stand with L LE slightly back (for weight-bearing and L side awareness) x 10 x 2 Tandem stance, firm surface,eyes open x 30" x 2 on each Weight shifting front/back, L LE forward x 10 x 2 Step ups, 4" box x 10 on each (little to no UE support) Standing marches x 10 x 2 (with UE support) Cone reaching/transfers (clasp hands) in standing from R to L, x 10 x 2  06/23/22 Evaluation and patient education    PATIENT EDUCATION: Education details: Provided HEP Person educated: Patient Education method: Consulting civil engineer, Media planner, and Handouts Education comprehension: needs further education  HOME EXERCISE PROGRAM: Access Code: [email or web address] URL: [email or web address]  Date: 06/30/2022 Prepared by: Rexene Alberts  Exercises - Seated Hamstring Stretch  - 1-2 x daily - 7 x weekly - 3 reps - 30 hold  GOALS: Goals reviewed with patient? Yes  SHORT TERM GOALS: Target date: 07/21/2022  Pt will demonstrate indep in HEP to facilitate carry-over of skilled services and improve functional outcomes Goal status: IN PROGRESS   LONG TERM GOALS: Target date: 08/18/2022  Pt will increase FOTO to at least 65 in order to demonstrate significant improvement in function related to ambulation Baseline: 62.0154 Goal status: IN PROGRESS  2.  Pt will increase 2MWT by at least 40 ft in order to demonstrate clinically significant improvement in community ambulation  Baseline: 287 ft Goal status: IN PROGRESS  3.  Pt will demonstrate increase in LE strength to 4+ to facilitate ease and safety in ambulation Baseline: 2- Goal status: IN PROGRESS  4.  Patient will demonstrate increase in Tinetti Score by 6 points in order to demonstrate clinically significant improvement in balance and decreased risk for falls  Baseline: 13 Goal status: IN PROGRESS  ASSESSMENT:  CLINICAL  IMPRESSION: Interventions today were geared towards LE strengthening, flexibility, balance, and promoting weight-shifting on the L. Interventions done with the L boot on. Demonstrated mild to moderate levels of fatigue. Rest periods provided.  Mild to moderate unsteadiness noted when doing marches on the L and step ups on the L due to impaired proprioception and LE weakness. Back pain =6/10 reported during forward step ups which reduced to 1/10 at the end of the session. Provided mild to moderate amount of multimodal cueing to ensure correct execution of activity with fair carry-over. To date, skilled PT is required to address the impairments and improve function.  EVAL: Patient is a 42 y.o. female who was seen today for physical therapy evaluation for CVA. Patient was diagnosed with CVA by referring provider further defined by difficulty with walling due to weakness, impaired proprioception/balance and decreased soft tissue extensibility. Skilled PT is required to address the impairments and functional limitations listed below. Patient also presents with signs and symptoms of unilat neglect so patient may benefit from SLP as unilat neglect can greatly affect patient's safety awareness especially when walking as she tends to deviate to the L. In addition, patient reports of UE weakness so patient may benefit from OT.  OBJECTIVE IMPAIRMENTS: Abnormal gait, decreased balance, difficulty walking, decreased strength, decreased safety awareness, and impaired flexibility.   ACTIVITY LIMITATIONS: bending, standing, squatting, and stairs  PARTICIPATION LIMITATIONS: meal prep, cleaning, laundry, driving, community activity, and occupation  PERSONAL FACTORS: Fitness and 1-2 comorbidities: DM and unilat neglect  are also affecting patient's functional outcome.   REHAB POTENTIAL: Fair    CLINICAL DECISION MAKING: Stable/uncomplicated  EVALUATION COMPLEXITY: Moderate  PLAN:  PT FREQUENCY: 2x/week  PT  DURATION: 8 weeks  PLANNED INTERVENTIONS: Therapeutic exercises, Therapeutic activity, Neuromuscular re-education, Balance training, Gait training, Patient/Family education, Self Care, Electrical stimulation, and Manual therapy  PLAN FOR NEXT SESSION: Continue POC and may progress as tolerated with emphasis on LE strengthening, flexibility, balance and gait activities. Also focus on tasks promoting recognition of the L side. Refer patient to provider for a possible referral to SLP and OT.    Harvie Heck. Micheal Murad, PT, DPT, OCS Board-Certified Clinical Specialist in [place] # ([place]): [phone] T 06/30/2022, 11:47 AM

## 2022-07-01 ENCOUNTER — Encounter: Payer: Self-pay | Admitting: Nephrology

## 2022-07-02 ENCOUNTER — Telehealth: Payer: Self-pay | Admitting: *Deleted

## 2022-07-02 ENCOUNTER — Encounter: Payer: Self-pay | Admitting: *Deleted

## 2022-07-02 NOTE — Progress Notes (Unsigned)
  Care Coordination Note  07/02/2022 Name: Lauren [name] MRN: [id number] DOB: [birth year]  [name] is a 42 y.o. year old female who is a primary care patient of Johnette Abraham, MD and is actively engaged with the care management team. I reached out to [name] by phone today to assist with re-scheduling a follow up visit with the Licensed Clinical Social Worker  Follow up plan: Unsuccessful telephone outreach attempt made. A HIPAA compliant phone message was left for the patient providing contact information and requesting a return call.   [place]  Direct Dial: [phone]

## 2022-07-02 NOTE — Progress Notes (Signed)
Error

## 2022-07-05 ENCOUNTER — Encounter (HOSPITAL_COMMUNITY): Payer: BC Managed Care – PPO | Admitting: Speech Pathology

## 2022-07-05 ENCOUNTER — Encounter (HOSPITAL_BASED_OUTPATIENT_CLINIC_OR_DEPARTMENT_OTHER): Payer: BC Managed Care – PPO | Attending: General Surgery | Admitting: General Surgery

## 2022-07-05 DIAGNOSIS — E1022 Type 1 diabetes mellitus with diabetic chronic kidney disease: Secondary | ICD-10-CM | POA: Diagnosis not present

## 2022-07-05 DIAGNOSIS — Z8673 Personal history of transient ischemic attack (TIA), and cerebral infarction without residual deficits: Secondary | ICD-10-CM | POA: Diagnosis not present

## 2022-07-05 DIAGNOSIS — L97522 Non-pressure chronic ulcer of other part of left foot with fat layer exposed: Secondary | ICD-10-CM | POA: Diagnosis not present

## 2022-07-05 DIAGNOSIS — E669 Obesity, unspecified: Secondary | ICD-10-CM | POA: Diagnosis not present

## 2022-07-05 DIAGNOSIS — I130 Hypertensive heart and chronic kidney disease with heart failure and stage 1 through stage 4 chronic kidney disease, or unspecified chronic kidney disease: Secondary | ICD-10-CM | POA: Diagnosis not present

## 2022-07-05 DIAGNOSIS — E10622 Type 1 diabetes mellitus with other skin ulcer: Secondary | ICD-10-CM | POA: Insufficient documentation

## 2022-07-05 DIAGNOSIS — Z955 Presence of coronary angioplasty implant and graft: Secondary | ICD-10-CM | POA: Insufficient documentation

## 2022-07-05 DIAGNOSIS — I5042 Chronic combined systolic (congestive) and diastolic (congestive) heart failure: Secondary | ICD-10-CM | POA: Insufficient documentation

## 2022-07-05 DIAGNOSIS — Z794 Long term (current) use of insulin: Secondary | ICD-10-CM | POA: Diagnosis not present

## 2022-07-05 DIAGNOSIS — I252 Old myocardial infarction: Secondary | ICD-10-CM | POA: Diagnosis not present

## 2022-07-05 DIAGNOSIS — L97425 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 without evidence of necrosis: Secondary | ICD-10-CM | POA: Diagnosis not present

## 2022-07-05 DIAGNOSIS — Z09 Encounter for follow-up examination after completed treatment for conditions other than malignant neoplasm: Secondary | ICD-10-CM | POA: Insufficient documentation

## 2022-07-05 DIAGNOSIS — L98499 Non-pressure chronic ulcer of skin of other sites with unspecified severity: Secondary | ICD-10-CM | POA: Insufficient documentation

## 2022-07-05 DIAGNOSIS — Z6833 Body mass index (BMI) 33.0-33.9, adult: Secondary | ICD-10-CM | POA: Diagnosis not present

## 2022-07-05 DIAGNOSIS — I2510 Atherosclerotic heart disease of native coronary artery without angina pectoris: Secondary | ICD-10-CM | POA: Diagnosis not present

## 2022-07-05 DIAGNOSIS — E10621 Type 1 diabetes mellitus with foot ulcer: Secondary | ICD-10-CM | POA: Diagnosis not present

## 2022-07-05 DIAGNOSIS — N184 Chronic kidney disease, stage 4 (severe): Secondary | ICD-10-CM | POA: Insufficient documentation

## 2022-07-06 ENCOUNTER — Encounter: Payer: Self-pay | Admitting: Podiatry

## 2022-07-06 ENCOUNTER — Other Ambulatory Visit: Payer: Self-pay | Admitting: *Deleted

## 2022-07-06 ENCOUNTER — Ambulatory Visit: Payer: BC Managed Care – PPO | Attending: Cardiology | Admitting: *Deleted

## 2022-07-06 DIAGNOSIS — Z5181 Encounter for therapeutic drug level monitoring: Secondary | ICD-10-CM | POA: Diagnosis not present

## 2022-07-06 DIAGNOSIS — I639 Cerebral infarction, unspecified: Secondary | ICD-10-CM

## 2022-07-06 DIAGNOSIS — I70222 Atherosclerosis of native arteries of extremities with rest pain, left leg: Secondary | ICD-10-CM

## 2022-07-06 LAB — POCT INR: INR: 2.6 (ref 2.0–3.0)

## 2022-07-06 NOTE — Patient Instructions (Signed)
Continue warfarin 1 tablet daily except 1 1/2 tablets on Wednesdays and Saturdays.  Recheck INR in 2 wks

## 2022-07-07 ENCOUNTER — Ambulatory Visit (HOSPITAL_COMMUNITY): Payer: BC Managed Care – PPO

## 2022-07-07 ENCOUNTER — Other Ambulatory Visit (HOSPITAL_COMMUNITY): Payer: Self-pay

## 2022-07-07 ENCOUNTER — Other Ambulatory Visit: Payer: Self-pay | Admitting: Nurse Practitioner

## 2022-07-07 MED ORDER — OMNIPOD 5 DEXG7G6 PODS GEN 5 MISC
3 refills | Status: DC
  Filled 2022-07-07: qty 15, 30d supply, fill #0

## 2022-07-07 NOTE — Progress Notes (Signed)
  Care Coordination Note  07/07/2022 Name: [name] MRN: [id number] DOB: 1980/09/07  Lauren [name] is a 42 y.o. year old female who is a primary care patient of Johnette Abraham, MD and is actively engaged with the care management team. I reached out to [name] by phone today to assist with re-scheduling a follow up visit with the Licensed Clinical Social Worker  Follow up plan: Telephone appointment with care management team member scheduled for:07/22/22  [place]  Direct Dial: [phone]

## 2022-07-07 NOTE — Progress Notes (Signed)
ANUOLUWA, KAGEL ([id number]) [id number] Page 1 of 7 Visit Report for 07/05/2022 Arrival Information Details Patient Name: Date of Service: Lauren [name], Lauren [name] 07/05/2022 3:45 PM Medical Record Number: [id number] Patient Account Number: [id number] Date of Birth/Sex: Treating RN: March 16, 1981 (42 y.o. [name] Primary Care Jarick Harkins: Marland Kitchen Other Clinician: Referring Aily Tzeng: Treating Ladonne Sharples/Extender: [name] in Treatment: 6 Visit Information History Since Last Visit Added or deleted any medications: No Patient Arrived: Ambulatory Any new allergies or adverse reactions: No Arrival Time: 15:40 Had a fall or experienced change in No Accompanied By: spouse activities of daily living that may affect Transfer Assistance: None risk of falls: Patient Identification Verified: Yes Signs or symptoms of abuse/neglect since last visito No Patient Requires Transmission-Based Precautions: No Hospitalized since last visit: No Patient Has Alerts: Yes Implantable device outside of the clinic excluding No Patient Alerts: Patient on Blood Thinner cellular tissue based products placed in the center On Warfarin since last visit: Has Dressing in Place as Prescribed: Yes Has Compression in Place as Prescribed: No Pain Present Now: Yes Electronic Signature(s) Signed: 07/05/2022 6:24:12 PM By: Dellie Catholic RN Entered By: Dellie Catholic on 07/05/2022 15:45:18 -------------------------------------------------------------------------------- Encounter Discharge Information Details Patient Name: Date of Service: Lauren [name], Lauren [name] 07/05/2022 3:45 PM Medical Record Number: [id number] Patient Account Number: [id number] Date of Birth/Sex: Treating RN: 06/27/1980 (42 y.o. [name] Primary Care Manny Vitolo: Marland Kitchen Other Clinician: Referring Rushawn Capshaw: Treating [name] [name]/Extender: [name] in Treatment:  6 Encounter Discharge Information Items Post Procedure Vitals Discharge Condition: Stable Temperature (F): 98.2 Ambulatory Status: Ambulatory Pulse (bpm): 80 Discharge Destination: Home Respiratory Rate (breaths/min): 16 Transportation: Private Auto Blood Pressure (mmHg): 150/92 Accompanied By: spouse Schedule Follow-up Appointment: Yes Clinical Summary of Care: Patient Declined Electronic Signature(s) Signed: 07/05/2022 6:24:12 PM By: Dellie Catholic RN Entered By: Dellie Catholic on 07/05/2022 16:58:28 -------------------------------------------------------------------------------- Lower Extremity Assessment Details Patient Name: Date of Service: Lauren [name], Lauren [name] 07/05/2022 3:45 PM Medical Record Number: [id number] Patient Account Number: [id number] Date of Birth/Sex: Treating RN: 12/12/1980 (42 y.o. [name] Primary Care Jezel Basto: Marland Kitchen Other Clinician: Referring Niani Mourer: Treating Hulen Mandler/Extender: [name] in Treatment: 6 Vascular Assessment KARRIN, STACY ([id number]) [phone].pdf Page 2 of 7] Pulses: Dorsalis Pedis Palpable: [Left:Yes] Electronic Signature(s) Signed: 07/05/2022 6:24:12 PM By: Dellie Catholic RN Entered By: Dellie Catholic on 07/05/2022 15:49:40 -------------------------------------------------------------------------------- Multi Wound Chart Details Patient Name: Date of Service: Lauren [name], Lauren [name] 07/05/2022 3:45 PM Medical Record Number: [id number] Patient Account Number: [id number] Date of Birth/Sex: Treating RN: 1980/10/25 (42 y.o. F) Primary Care Sanora Cunanan: Marland Kitchen Other Clinician: Referring Sahan Pen: Treating Lilymarie Scroggins/Extender: [name] in Treatment: 6 Vital Signs Height(in): 65 Pulse(bpm): 55 Weight(lbs): 201 Blood Pressure(mmHg): 150/92 Body Mass Index(BMI): 33.4 Temperature(F): 98.2 Respiratory Rate(breaths/min):  16 [2:Photos:] [N/A:N/A] Left, Plantar Foot N/A N/A Wound Location: Gradually Appeared N/A N/A Wounding Event: Diabetic Wound/Ulcer of the Lower N/A N/A Primary Etiology: Extremity Anemia, Congestive Heart Failure, N/A N/A Comorbid History: Coronary Artery Disease, Hypertension, Myocardial Infarction, Type I Diabetes, Osteomyelitis, Neuropathy 03/03/2022 N/A N/A Date Acquired: 5 N/A N/A Weeks of Treatment: Open N/A N/A Wound Status: No N/A N/A Wound Recurrence: 1.5x1.4x0.6 N/A N/A Measurements L x W x D (cm) 1.649 N/A N/A A (cm) : rea 0.99 N/A N/A Volume (cm) : 41.70% N/A N/A % Reduction in A rea: -249.80% N/A N/A % Reduction in Volume: 9 Starting Position 1 (o'clock): 10 Ending Position 1 (o'clock): 0.5 Maximum Distance 1 (cm): Yes N/A N/A Undermining: Grade 1 N/A  N/A Classification: Medium N/A N/A Exudate A mount: Serosanguineous N/A N/A Exudate Type: red, brown N/A N/A Exudate Color: Distinct, outline attached N/A N/A Wound Margin: Small (1-33%) N/A N/A Granulation A mount: Pink N/A N/A Granulation Quality: Large (67-100%) N/A N/A Necrotic A mount: Eschar, Adherent Slough N/A N/A Necrotic Tissue: Fat Layer (Subcutaneous Tissue): Yes N/A N/A Exposed Structures: Fascia: No Tendon: No Muscle: No Joint: No Bone: No None N/A N/A Epithelialization: Debridement - Excisional N/A N/A Debridement: Pre-procedure Verification/Time Out 15:50 N/A N/A Taken: BRAYTON, BROXSON ([id number]) [id number] Page 3 of 7 Lidocaine 4% Topical Solution N/A N/A Pain Control: Tendon, Slough N/A N/A Tissue Debrided: Skin/Subcutaneous Tissue/Muscle N/A N/A Level: 2.1 N/A N/A Debridement A (sq cm): rea Curette N/A N/A Instrument: Minimum N/A N/A Bleeding: Pressure N/A N/A Hemostasis A chieved: 0 N/A N/A Procedural Pain: 0 N/A N/A Post Procedural Pain: Procedure was tolerated well N/A N/A Debridement Treatment  Response: 1.5x1.4x0.6 N/A N/A Post Debridement Measurements L x W x D (cm) 0.99 N/A N/A Post Debridement Volume: (cm) No Abnormalities Noted N/A N/A Periwound Skin Texture: Dry/Scaly: Yes N/A N/A Periwound Skin Moisture: No Abnormalities Noted N/A N/A Periwound Skin Color: No Abnormality N/A N/A Temperature: Debridement N/A N/A Procedures Performed: Treatment Notes Wound #2 (Foot) Wound Laterality: Plantar, Left Cleanser Soap and Water Discharge Instruction: May shower and wash wound with dial antibacterial soap and water prior to dressing change. Wound Cleanser Discharge Instruction: Cleanse the wound with wound cleanser prior to applying a clean dressing using gauze sponges, not tissue or cotton balls. Peri-Wound Care Skin Prep Discharge Instruction: Use skin prep as directed Topical Primary Dressing Santyl Ointment Discharge Instruction: Apply nickel thick amount to wound bed as instructed Secondary Dressing Zetuvit Plus Silicone Border Dressing 4x4 (in/in) Discharge Instruction: Apply silicone border over primary dressing as directed. Secured With Compression Wrap Compression Stockings Environmental education officer) Signed: 07/05/2022 4:51:16 PM By: Fredirick Maudlin MD FACS Entered By: Fredirick Maudlin on 07/05/2022 16:51:16 -------------------------------------------------------------------------------- Multi-Disciplinary Care Plan Details Patient Name: Date of Service: Lauren [name], Lauren [name] 07/05/2022 3:45 PM Medical Record Number: [id number] Patient Account Number: [id number] Date of Birth/Sex: Treating RN: 1980/05/10 (42 y.o. [name] Primary Care Dynastie Knoop: Marland Kitchen Other Clinician: Referring Kilian Schwartz: Treating Delphin Funes/Extender: [name] in Treatment: 6 Multidisciplinary Care Plan reviewed with physician Active Inactive Nutrition Nursing Diagnoses: AKANKSHA, ZELASKO ([id number]) [id number]  Page 4 of 7 Impaired glucose control: actual or potential Potential for alteratiion in Nutrition/Potential for imbalanced nutrition Goals: Patient/caregiver will maintain therapeutic glucose control Date Initiated: 05/21/2022 Target Resolution Date: 08/31/2022 Goal Status: Active Interventions: Assess HgA1c results as ordered upon admission and as needed Assess patient nutrition upon admission and as needed per policy Provide education on elevated blood sugars and impact on wound healing Treatment Activities: Patient referred to Primary Care Physician for further nutritional evaluation : 05/21/2022 Notes: Wound/Skin Impairment Nursing Diagnoses: Impaired tissue integrity Knowledge deficit related to ulceration/compromised skin integrity Goals: Patient/caregiver will verbalize understanding of skin care regimen Date Initiated: 05/21/2022 Target Resolution Date: 08/31/2022 Goal Status: Active Ulcer/skin breakdown will have a volume reduction of 30% by week 4 Date Initiated: 05/21/2022 Target Resolution Date: 08/31/2022 Goal Status: Active Interventions: Assess patient/caregiver ability to obtain necessary supplies Assess patient/caregiver ability to perform ulcer/skin care regimen upon admission and as needed Assess ulceration(s) every visit Provide education on ulcer and skin care Treatment Activities: Skin care regimen initiated : 05/21/2022 Topical wound management initiated : 05/21/2022 Notes: Electronic Signature(s) Signed: 07/05/2022 6:24:12 PM By: Dellie Catholic RN Entered By:  Dellie Catholic on 07/05/2022 16:38:11 -------------------------------------------------------------------------------- Pain Assessment Details Patient Name: Date of Service: Lauren [name], Lauren [name] 07/05/2022 3:45 PM Medical Record Number: [id number] Patient Account Number: [id number] Date of Birth/Sex: Treating RN: [birth year] (42 y.o. [name] Primary Care Navya Timmons: Marland Kitchen Other  Clinician: Referring Pluma Diniz: Treating Vahe Pienta/Extender: [name] in Treatment: 6 Active Problems Location of Pain Severity and Description of Pain Patient Has Paino Yes Site Locations Pain LocationMARQUISE, DOUBLIN ([id number]) [id number] Page 5 of 7 Pain Location: Generalized Pain With Dressing Change: No Duration of the Pain. Constant / Intermittento Constant Rate the pain. Current Pain Level: 8 Worst Pain Level: 10 Least Pain Level: 4 Tolerable Pain Level: 4 Character of Pain Describe the Pain: Throbbing Pain Management and Medication Current Pain Management: Medication: Yes Cold Application: No Rest: Yes Massage: No Activity: No T.E.N.S.: No Heat Application: No Leg drop or elevation: No Is the Current Pain Management Adequate: Adequate How does your wound impact your activities of daily livingo Sleep: No Bathing: No Appetite: No Relationship With Others: No Bladder Continence: No Emotions: No Bowel Continence: No Work: No Toileting: No Drive: No Dressing: No Hobbies: No Electronic Signature(s) Signed: 07/05/2022 6:24:12 PM By: Dellie Catholic RN Entered By: Dellie Catholic on 07/05/2022 15:49:27 -------------------------------------------------------------------------------- Patient/Caregiver Education Details Patient Name: Date of Service: Lauren [name] 3/4/2024andnbsp3:45 PM Medical Record Number: [id number] Patient Account Number: [id number] Date of Birth/Gender: Treating RN: [birth year] (42 y.o. [name] Primary Care Physician: Marland Kitchen Other Clinician: Referring Physician: Treating Physician/Extender: [name] in Treatment: 6 Education Assessment Education Provided To: Patient Education Topics Provided Wound/Skin Impairment: Methods: Explain/Verbal Responses: Return demonstration correctly Electronic Signature(s) Signed: 07/05/2022 6:24:12  PM By: Dellie Catholic RN Entered By: Dellie Catholic on 07/05/2022 16:38:39 Lauren [name] ([id number].pdf Page 6 of 7 -------------------------------------------------------------------------------- Wound Assessment Details Patient Name: Date of Service: Lauren [name], Lauren [name] 07/05/2022 3:45 PM Medical Record Number: [id number] Patient Account Number: [id number] Date of Birth/Sex: Treating RN: 12-08-1980 (42 y.o. [name] Primary Care Cardin Nitschke: Marland Kitchen Other Clinician: Referring Hartlyn Reigel: Treating Ellamarie Naeve/Extender: [name] in Treatment: 6 Wound Status Wound Number: 2 Primary Diabetic Wound/Ulcer of the Lower Extremity Etiology: Wound Location: Left, Plantar Foot Wound Open Wounding Event: Gradually Appeared Status: Date Acquired: 03/03/2022 Comorbid Anemia, Congestive Heart Failure, Coronary Artery Disease, Weeks Of Treatment: 5 History: Hypertension, Myocardial Infarction, Type I Diabetes, Clustered Wound: No Osteomyelitis, Neuropathy Photos Wound Measurements Length: (cm) 1.5 Width: (cm) 1.4 Depth: (cm) 0.6 Area: (cm) 1.649 Volume: (cm) 0.99 % Reduction in Area: 41.7% % Reduction in Volume: -249.8% Epithelialization: None Tunneling: No Undermining: Yes Starting Position (o'clock): 9 Ending Position (o'clock): 10 Maximum Distance: (cm) 0.5 Wound Description Classification: Grade 1 Wound Margin: Distinct, outline attached Exudate Amount: Medium Exudate Type: Serosanguineous Exudate Color: red, brown Foul Odor After Cleansing: No Slough/Fibrino Yes Wound Bed Granulation Amount: Small (1-33%) Exposed Structure Granulation Quality: Pink Fascia Exposed: No Necrotic Amount: Large (67-100%) Fat Layer (Subcutaneous Tissue) Exposed: Yes Necrotic Quality: Eschar, Adherent Slough Tendon Exposed: No Muscle Exposed: No Joint Exposed: No Bone Exposed: No Periwound Skin Texture Texture  Color No Abnormalities Noted: Yes No Abnormalities Noted: Yes Moisture Temperature / Pain No Abnormalities Noted: No Temperature: No Abnormality Dry / Scaly: Yes Treatment Notes Wound #2 (Foot) Wound Laterality: Plantar, Left Cleanser Soap and Water Discharge Instruction: May shower and wash wound with dial antibacterial soap and water prior to dressing change. Lauren [name], Lauren [name] ([id number]) [id number] Page 7 of 7 Wound Cleanser Discharge Instruction: Cleanse  the wound with wound cleanser prior to applying a clean dressing using gauze sponges, not tissue or cotton balls. Peri-Wound Care Skin Prep Discharge Instruction: Use skin prep as directed Topical Primary Dressing Santyl Ointment Discharge Instruction: Apply nickel thick amount to wound bed as instructed Secondary Dressing Zetuvit Plus Silicone Border Dressing 4x4 (in/in) Discharge Instruction: Apply silicone border over primary dressing as directed. Secured With Compression Wrap Compression Stockings Environmental education officer) Signed: 07/05/2022 6:24:12 PM By: Dellie Catholic RN Entered By: Dellie Catholic on 07/05/2022 15:51:54 -------------------------------------------------------------------------------- Vitals Details Patient Name: Date of Service: Lauren [name], Lauren [name] 07/05/2022 3:45 PM Medical Record Number: [id number] Patient Account Number: [id number] Date of Birth/Sex: Treating RN: 02/15/1981 (42 y.o. [name] Primary Care Sadiel Mota: Marland Kitchen Other Clinician: Referring Lisia Westbay: Treating Yug Loria/Extender: [name] in Treatment: 6 Vital Signs Time Taken: 14:39 Temperature (F): 98.2 Height (in): 65 Pulse (bpm): 80 Weight (lbs): 201 Respiratory Rate (breaths/min): 16 Body Mass Index (BMI): 33.4 Blood Pressure (mmHg): 150/92 Reference Range: 80 - 120 mg / dl Electronic Signature(s) Signed: 07/05/2022 6:24:12 PM By: Dellie Catholic  RN Entered By: Dellie Catholic on 07/05/2022 15:48:48

## 2022-07-07 NOTE — Progress Notes (Signed)
MARILENA, GORELICK ([id number]) [id number] Page 1 of 8 Visit Report for 07/05/2022 Chief Complaint Document Details Patient Name: Date of Service: Lauren [name], Lauren [name] 07/05/2022 3:45 PM Medical Record Number: [id number] Patient Account Number: [id number] Date of Birth/Sex: Treating RN: Aug 26, 1980 (42 y.o. F) Primary Care Provider: Marland Kitchen Other Clinician: Referring Provider: Treating Provider/Extender: [name] in Treatment: 6 Information Obtained from: Patient Chief Complaint Patients presents for treatment of an open diabetic ulcer on her abdomen Electronic Signature(s) Signed: 07/05/2022 4:51:26 PM By: Fredirick Maudlin MD FACS Entered By: Fredirick Maudlin on 07/05/2022 16:51:26 -------------------------------------------------------------------------------- Debridement Details Patient Name: Date of Service: Lauren [name], Lauren [name] 07/05/2022 3:45 PM Medical Record Number: [id number] Patient Account Number: [id number] Date of Birth/Sex: Treating RN: 04-10-1981 (42 y.o. [name] Primary Care Provider: Marland Kitchen Other Clinician: Referring Provider: Treating Provider/Extender: [name] in Treatment: 6 Debridement Performed for Assessment: Wound #2 [place] Performed By: Physician Fredirick Maudlin, MD Debridement Type: Debridement Severity of Tissue Pre Debridement: Fat layer exposed Level of Consciousness (Pre-procedure): Awake and Alert Pre-procedure Verification/Time Out Yes - 15:50 Taken: Start Time: 15:50 Pain Control: Lidocaine 4% T opical Solution T Area Debrided (L x W): otal 1.5 (cm) x 1.4 (cm) = 2.1 (cm) Tissue and other material debrided: Non-Viable, Slough, T endon, Slough Level: Skin/Subcutaneous Tissue/Muscle Debridement Description: Excisional Instrument: Curette Bleeding: Minimum Hemostasis Achieved: Pressure End Time: 15:52 Procedural Pain: 0 Post Procedural  Pain: 0 Response to Treatment: Procedure was tolerated well Level of Consciousness (Post- Awake and Alert procedure): Post Debridement Measurements of Total Wound Length: (cm) 1.5 Width: (cm) 1.4 Depth: (cm) 0.6 Volume: (cm) 0.99 Character of Wound/Ulcer Post Debridement: Improved Severity of Tissue Post Debridement: Fat layer exposed Post Procedure Diagnosis Same as Pre-procedure Notes Scribed for Dr. Celine Ahr by J.Scotton Electronic Signature(s) [place], [name] ([id number]) [id number] Page 2 of 8 Signed: 07/05/2022 5:08:25 PM By: Fredirick Maudlin MD FACS Signed: 07/05/2022 6:24:12 PM By: Dellie Catholic RN Entered By: Dellie Catholic on 07/05/2022 16:03:55 -------------------------------------------------------------------------------- HPI Details Patient Name: Date of Service: Lauren [name], Lauren [name] 07/05/2022 3:45 PM Medical Record Number: [id number] Patient Account Number: [id number] Date of Birth/Sex: Treating RN: [birth year] (42 y.o. 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 She has been on Lovenox injections and also wears cutaneous blood sugar monitor and insulin pump. Apparently about a month ago, she developed a small lump under the skin that subsequently ruptured. This is left a small ulcer in her left lower abdomen. Her primary care provider has had her applying Neosporin and Band-Aids. According to the patient and her daughter, there is  really been no change or improvement since that time. 05/31/2022: The wound on her abdomen has a layer of eschar on it. Her podiatrist, [name], has also asked [name] to evaluate the wound on her foot. There is slough and eschar present. Apparently they had been using Iodosorb but recently changed to [facility]. 06/28/2022: Since our last visit, the patient was seen by Dr. Carlis Abbott in vascular surgery. She underwent an angiogram with angioplasty and stenting to restore inline flow to her foot. She has  now been optimized from a vascular standpoint. She has been using Santyl on her foot. The wound is a little bit larger today and there is a fair amount of nonviable tissue present. No purulent drainage. 07/05/2022: The wound looks a little bit cleaner today. There is still some nonviable tendon hanging from the wound. The culture that I took grew out a tetracycline resistant MRSA. Because she is on warfarin, has a number of allergies, and has decreased renal function, antimicrobial options are limited. Elesa Hacker was ordered, but we are still working on getting that for her. We also ordered Keystone topical antimicrobial compound, but this has not yet been delivered. Electronic Signature(s) Signed: 07/05/2022 4:52:55 PM By: Fredirick Maudlin MD FACS Entered By: Fredirick Maudlin on 07/05/2022 16:52:55 -------------------------------------------------------------------------------- Physical Exam Details Patient Name: Date of Service: Lauren [name], Lauren [name] 07/05/2022 3:45 PM Medical Record Number: [id number] Patient Account Number: [id number] Date of Birth/Sex: Treating RN: [birth year] (42 y.o. F) Primary Care Provider: Marland Kitchen Other Clinician: Referring Provider: Treating Provider/Extender: [name] in Treatment: 6 Constitutional Hypertensive, asymptomatic. . . . no acute distress. Respiratory Normal work of breathing on room air. Notes 07/05/2022: The wound looks a little bit  cleaner today. There is still some nonviable tendon hanging from the wound. Electronic Signature(s) Signed: 07/05/2022 4:54:03 PM By: Fredirick Maudlin MD FACS Entered By: Fredirick Maudlin on 07/05/2022 16:54:03 -------------------------------------------------------------------------------- Physician Orders Details Patient Name: Date of Service: SOMTOCHUKWU, MAHALA 07/05/2022 3:45 PM Medical Record Number: [id number] Patient Account Number: [id number] Date of Birth/Sex: Treating RN: 12/27/1980 (42 y.o. 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 - Dr. Celine Ahr Room 3 Return A Monday March 11th at 3pm Anesthetic (In clinic) Topical Lidocaine 5% applied to wound bed Bathing/ Shower/ Hygiene May shower and wash wound with soap and water. Wound Treatment Wound #2 - Foot Wound Laterality: Plantar, Left Cleanser: Soap and Water 1 x Per Day/30 Days Discharge Instructions: May shower and wash wound with dial antibacterial soap and water prior to dressing change. Cleanser: Wound Cleanser 1 x Per Day/30 Days Discharge Instructions: Cleanse the wound with wound cleanser prior to applying a clean dressing using gauze sponges, not tissue or cotton balls. Peri-Wound Care: Skin Prep (Generic) 1 x Per Day/30  Days Discharge Instructions: Use skin prep as directed Prim Dressing: Santyl Ointment 1 x Per Day/30 Days ary Discharge Instructions: Apply nickel thick amount to wound bed as instructed Secondary Dressing: Zetuvit Plus Silicone Border Dressing 4x4 (in/in) (Generic) 1 x Per Day/30 Days Discharge Instructions: Apply silicone border over primary dressing as directed. Electronic Signature(s) Signed: 07/05/2022 4:54:22 PM By: Fredirick Maudlin MD FACS Entered By: Fredirick Maudlin on 07/05/2022 16:54:22 -------------------------------------------------------------------------------- Problem List Details Patient Name: Date of Service: Lauren [name], Lauren [name] 07/05/2022 3:45 PM Medical Record Number: [id number] Patient Account Number: [id number] Date of Birth/Sex: Treating RN: [birth year] (42 y.o. 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 05/31/2022 No Yes without evidence of necrosis E10.622 Type 1 diabetes mellitus with other skin ulcer 05/21/2022 No Yes SEIRA, DLUGOSZ ([id number]) [id number] Page 4 of 8 N18.4 Chronic kidney disease, stage 4 (severe) 05/21/2022 No Yes I50.42 Chronic combined systolic (congestive) and diastolic (congestive) heart failure 05/21/2022 No Yes E66.9 Obesity, unspecified 05/21/2022 No Yes I63.9 Cerebral infarction, unspecified 05/21/2022 No Yes Inactive Problems Resolved Problems ICD-10 Code Description Active Date Resolved Date L98.499 Non-pressure chronic ulcer of skin of other sites with unspecified severity 05/21/2022 05/21/2022 Electronic Signature(s) Signed: 07/05/2022 4:51:06 PM By: Fredirick Maudlin MD FACS Entered By: Fredirick Maudlin on 07/05/2022  16:51:06 -------------------------------------------------------------------------------- Progress Note Details Patient Name: Date of Service: Lauren [name], Lauren [name] 07/05/2022 3:45 PM Medical Record Number: [id number] Patient Account Number: [id number] Date of Birth/Sex: Treating RN: June 06, 1980 (42 y.o. 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 She has been on Lovenox injections and also wears cutaneous blood sugar monitor and insulin pump. Apparently about a month ago, she developed a small lump under the skin that subsequently ruptured. This is left a small ulcer in her left lower abdomen. Her primary care provider has had her applying Neosporin and Band-Aids. According to the patient and her daughter, there is really been no change or improvement since that time. 05/31/2022: The wound on her abdomen has a layer of eschar on it. Her podiatrist, [name], has also asked [name] to evaluate the wound on her foot. There is slough and eschar present. Apparently they had been using Iodosorb but recently changed to [facility]. 06/28/2022: Since our last visit, the patient was seen by Dr. Carlis Abbott in vascular surgery. She underwent an angiogram with angioplasty and stenting to restore inline flow to her foot. She has now been optimized from a vascular standpoint. She  has been using Santyl on her foot. The wound is a little bit larger today and there is a fair amount of nonviable tissue present. No purulent drainage. 07/05/2022: The wound looks a little bit cleaner today. There is still some nonviable tendon hanging from the wound. The culture that I took grew out a tetracycline resistant MRSA. Because she is on warfarin, has a number of allergies, and has decreased renal function, antimicrobial options are limited. Elesa Hacker was ordered, but we are still working on getting that for her. We also ordered Keystone topical antimicrobial compound, but this has not yet been delivered. Patient History Information obtained from Patient, Caregiver, Chart. Family History Cancer - Paternal Grandparents, Diabetes - Child,Siblings, Heart Disease - Mother,Father,Maternal Grandparents,Paternal Grandparents, Hypertension - Mother,Father, Lung Disease - Mother,Siblings, Stroke - Mother, Thyroid Problems - Mother, No family history of Hereditary Spherocytosis, Kidney Disease, Seizures, Tuberculosis. RECHELL, VORWERK ([id number]) [id number] Page 5 of 8 Social History Former smoker - quit 01/2022, Marital Status - Married, Alcohol Use - Never, Drug Use - No History, Caffeine Use - Rarely. Medical History Eyes Denies history of Cataracts, Glaucoma,  Optic Neuritis Ear/Nose/Mouth/Throat Denies history of Chronic sinus problems/congestion, Middle ear problems Hematologic/Lymphatic Patient has history of Anemia Cardiovascular Patient has history of Congestive Heart Failure, Coronary Artery Disease, Hypertension, Myocardial Infarction Denies history of Peripheral Arterial Disease Endocrine Patient has history of Type I Diabetes Genitourinary Denies history of End Stage Renal Disease Integumentary (Skin) Denies history of History of Burn Musculoskeletal Patient has history of Osteomyelitis - left foot Neurologic Patient has history of  Neuropathy Oncologic Denies history of Received Chemotherapy, Received Radiation Psychiatric Denies history of Anorexia/bulimia, Confinement Anxiety Hospitalization/Surgery History - thrombectomy right brachial artery. - left 5th toe ray amputation. - cholecystectomy. - tubal ligation. - cardiac cath. Medical A Surgical History Notes nd Constitutional Symptoms (General Health) vitamin D deficiency Ear/Nose/Mouth/Throat frequent nose bleeds Respiratory hx pneumonia Cardiovascular hx thrombus right arm, right carotid artery occlusion, ischemic cardiomyopathy, hyperlipidemia Endocrine hypothyroidism Genitourinary CKD stage 4 Neurologic CVA, left hemiplegia Objective Constitutional Hypertensive, asymptomatic. no acute distress. Vitals Time Taken: 2:39 PM, Height: 65 in, Weight: 201 lbs, BMI: 33.4, Temperature: 98.2 F, Pulse: 80 bpm, Respiratory Rate: 16 breaths/min, Blood Pressure: 150/92 mmHg. Respiratory Normal work of breathing on room air. General Notes: 07/05/2022: The wound looks a little bit cleaner today. There is still some nonviable tendon hanging from the wound. Integumentary (Hair, Skin) Wound #2 status is Open. Original cause of wound was Gradually Appeared. The date acquired was: 03/03/2022. The wound has been in treatment 5 weeks. The wound is located on the [place]. The wound measures 1.5cm length x 1.4cm width x 0.6cm depth; 1.649cm^2 area and 0.99cm^3 volume. There is Fat Layer (Subcutaneous Tissue) exposed. There is no tunneling noted, however, there is undermining starting at 9:00 and ending at 10:00 with a maximum distance of 0.5cm. There is a medium amount of serosanguineous drainage noted. The wound margin is distinct with the outline attached to the wound base. There is small (1-33%) pink granulation within the wound bed. There is a large (67-100%) amount of necrotic tissue within the wound bed including Eschar and Adherent Slough. The periwound skin  appearance had no abnormalities noted for texture. The periwound skin appearance had no abnormalities noted for color. The periwound skin appearance exhibited: Dry/Scaly. Periwound temperature was noted as No Abnormality. 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 There was a Excisional Skin/Subcutaneous Tissue/Muscle Debridement with a total area of 2.1 sq cm performed by Fredirick Maudlin, MD. With the following instrument(s): Curette to remove Non-Viable tissue/material. Material removed includes T endon and Slough and after achieving pain control using Lidocaine 4% T opical Solution. No specimens were taken. A time out was conducted at 15:50, prior to the start of the procedure. A Minimum amount of bleeding was controlled with Pressure. The procedure was tolerated well with a pain level of 0 throughout and a pain level of 0 following the procedure. Post Debridement Measurements: 1.5cm length x 1.4cm width x 0.6cm depth; 0.99cm^3 volume. Character of Wound/Ulcer Post Debridement is improved. Severity of Tissue Post Debridement is: Fat layer exposed. Post procedure Diagnosis Wound #2: Same as  Pre-Procedure General Notes: Scribed for Dr. Celine Ahr by J.Scotton. Plan Follow-up Appointments: Return Appointment in 1 week. - Dr. Celine Ahr Room 3 Monday March 11th at 3pm Anesthetic: (In clinic) Topical Lidocaine 5%  applied to wound bed Bathing/ Shower/ Hygiene: May shower and wash wound with soap and water. WOUND #2: - Foot Wound Laterality: Plantar, Left Cleanser: Soap and Water 1 x Per Day/30 Days Discharge Instructions: May shower and wash wound with dial antibacterial soap and water prior to dressing change. Cleanser: Wound Cleanser 1 x Per Day/30 Days Discharge Instructions: Cleanse the wound with wound cleanser prior to applying a clean dressing using gauze sponges, not tissue or cotton balls. Peri-Wound Care: Skin Prep (Generic) 1 x Per Day/30 Days Discharge Instructions: Use skin prep as directed Prim Dressing: Santyl Ointment 1 x Per Day/30 Days ary Discharge Instructions: Apply nickel thick amount to wound bed as instructed Secondary Dressing: Zetuvit Plus Silicone Border Dressing 4x4 (in/in) (Generic) 1 x Per Day/30 Days Discharge Instructions: Apply silicone border over primary dressing as directed. 07/05/2022: The wound looks a little bit cleaner today. There is still some nonviable tendon hanging from the wound. I used a curette along with scissors and forceps to debride slough and nonviable tendon from the wound. We will continue to use Santyl for ongoing enzymatic debridement. I am hopeful that we will be able to move on to something else at her visit next week. Electronic Signature(s) Signed: 07/05/2022 4:58:39 PM By: Fredirick Maudlin MD FACS Entered By: Fredirick Maudlin on 07/05/2022 16:58:39 -------------------------------------------------------------------------------- HxROS Details Patient Name: Date of Service: Lauren [name], Lauren [name] 07/05/2022 3:45 PM Medical Record Number: [id number] Patient Account Number: [id number] Date of Birth/Sex: Treating RN: 04-05-1981 (42 y.o. F) Primary Care Provider: Marland Kitchen Other Clinician: Referring Provider: Treating Provider/Extender: [name] in Treatment: 6 Information Obtained From Patient Caregiver  Chart Constitutional Symptoms (General Health) Medical History: Past Medical History Notes: Alvera Novel deficiency MALOREY, HIRSHMAN ([id number]) [id number] Page 7 of 8 Eyes Medical History: Negative for: Cataracts; Glaucoma; Optic Neuritis Ear/Nose/Mouth/Throat Medical History: Negative for: Chronic sinus problems/congestion; Middle ear problems Past Medical History Notes: frequent nose bleeds Hematologic/Lymphatic Medical History: Positive for: Anemia Respiratory Medical History: Past Medical History Notes: hx pneumonia Cardiovascular Medical History: Positive for: Congestive Heart Failure; Coronary Artery Disease; Hypertension; Myocardial Infarction Negative for: Peripheral Arterial Disease Past Medical History Notes: hx thrombus right arm, right carotid artery occlusion, ischemic cardiomyopathy, hyperlipidemia Endocrine Medical History: Positive for: Type I Diabetes Past Medical History Notes: hypothyroidism Time with diabetes: 32 yrs Treated with: Insulin Blood sugar tested every day: Yes Tested : has dexcom Genitourinary Medical History: Negative for: End Stage Renal Disease Past Medical History Notes: CKD stage 4 Integumentary (Skin) Medical History: Negative for: History of Burn Musculoskeletal Medical History: Positive for: Osteomyelitis - left foot Neurologic Medical History: Positive for: Neuropathy Past Medical History Notes: CVA, left hemiplegia Oncologic Medical History: Negative for: Received Chemotherapy; Received Radiation Psychiatric Medical History: Negative for: Anorexia/bulimia; Confinement Anxiety Immunizations Pneumococcal Vaccine: Received Pneumococcal Vaccination: No Lauren [name], Lauren [name] ([id number]) [id number] Page 8 of 8 Implantable Devices None Hospitalization / Surgery History Type of Hospitalization/Surgery thrombectomy right brachial artery left 5th toe ray  amputation cholecystectomy tubal ligation cardiac cath Family and Social History Cancer: Yes - Paternal Grandparents; Diabetes: Yes - Child,Siblings; Heart Disease: Yes - Mother,Father,Maternal Grandparents,Paternal Grandparents; Hereditary Spherocytosis: No; Hypertension: Yes - Mother,Father; Kidney Disease: No; Lung Disease: Yes - Mother,Siblings; Seizures: No; Stroke: Yes - Mother; Thyroid Problems: Yes - Mother; Tuberculosis: No; Former smoker -  quit 01/2022; Marital Status - Married; Alcohol Use: Never; Drug Use: No History; Caffeine Use: Rarely; Financial Concerns: No; Food, Clothing or Shelter Needs: No; Support System Lacking: No; Transportation Concerns: No Electronic Signature(s) Signed: 07/05/2022 5:08:25 PM By: Fredirick Maudlin MD FACS Entered By: Fredirick Maudlin on 07/05/2022 16:53:35 -------------------------------------------------------------------------------- SuperBill Details Patient Name: Date of Service: Lauren [name], Lauren [name] 07/05/2022 Medical Record Number: [id number] Patient Account Number: [id number] Date of Birth/Sex: Treating RN: 01-15-1981 (42 y.o. 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 without evidence of necrosis E10.622 Type 1 diabetes mellitus with other skin ulcer N18.4 Chronic kidney disease, stage 4 (severe) I50.42 Chronic combined systolic (congestive) and diastolic (congestive) heart failure E66.9 Obesity, unspecified I63.9 Cerebral infarction, unspecified Facility Procedures : CPT4 Code: [id number] Description: [id number] - DEB MUSC/FASCIA 20 SQ CM/< ICD-10 Diagnosis Description 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 wi Modifier: thout evidence of ne Quantity: 1  crosis Physician Procedures : CPT4 Code Description Modifier [id number] - WC PHYS LEVEL 4 - EST PT 25 ICD-10 Diagnosis Description 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

## 2022-07-08 ENCOUNTER — Other Ambulatory Visit: Payer: Self-pay

## 2022-07-08 ENCOUNTER — Other Ambulatory Visit (HOSPITAL_COMMUNITY): Payer: Self-pay

## 2022-07-08 MED ORDER — OXYCODONE HCL 5 MG PO TABS: 5.0000 mg | ORAL_TABLET | Freq: Four times a day (QID) | ORAL | 0 refills | Status: DC | PRN

## 2022-07-08 NOTE — Addendum Note (Signed)
Addended bySherryle Lis, Aakash Hollomon R on: 07/08/2022 09:16 AM   Modules accepted: Orders

## 2022-07-09 ENCOUNTER — Encounter (HOSPITAL_COMMUNITY): Payer: BC Managed Care – PPO | Admitting: Occupational Therapy

## 2022-07-09 ENCOUNTER — Encounter: Payer: Self-pay | Admitting: Physical Medicine & Rehabilitation

## 2022-07-09 ENCOUNTER — Encounter: Payer: BC Managed Care – PPO | Attending: Registered Nurse | Admitting: Physical Medicine & Rehabilitation

## 2022-07-09 ENCOUNTER — Ambulatory Visit (HOSPITAL_COMMUNITY): Payer: BC Managed Care – PPO

## 2022-07-09 VITALS — BP 131/84 | HR 77 | Ht 65.0 in

## 2022-07-09 DIAGNOSIS — R252 Cramp and spasm: Secondary | ICD-10-CM | POA: Insufficient documentation

## 2022-07-09 DIAGNOSIS — Z5181 Encounter for therapeutic drug level monitoring: Secondary | ICD-10-CM | POA: Insufficient documentation

## 2022-07-09 DIAGNOSIS — L97529 Non-pressure chronic ulcer of other part of left foot with unspecified severity: Secondary | ICD-10-CM | POA: Insufficient documentation

## 2022-07-09 DIAGNOSIS — Z79891 Long term (current) use of opiate analgesic: Secondary | ICD-10-CM | POA: Insufficient documentation

## 2022-07-09 DIAGNOSIS — I63511 Cerebral infarction due to unspecified occlusion or stenosis of right middle cerebral artery: Secondary | ICD-10-CM | POA: Diagnosis not present

## 2022-07-09 DIAGNOSIS — G894 Chronic pain syndrome: Secondary | ICD-10-CM | POA: Insufficient documentation

## 2022-07-09 DIAGNOSIS — E11621 Type 2 diabetes mellitus with foot ulcer: Secondary | ICD-10-CM | POA: Diagnosis not present

## 2022-07-09 DIAGNOSIS — S43002S Unspecified subluxation of left shoulder joint, sequela: Secondary | ICD-10-CM | POA: Insufficient documentation

## 2022-07-09 MED ORDER — TRAZODONE HCL 50 MG PO TABS: 50.0000 mg | ORAL_TABLET | Freq: Every day | ORAL | 2 refills | Status: DC

## 2022-07-09 MED ORDER — LIDOCAINE HCL 1 % IJ SOLN: 1.0000 mL | Freq: Once | INTRAMUSCULAR | Status: DC

## 2022-07-09 MED ORDER — TRIAMCINOLONE ACETONIDE 40 MG/ML IJ SUSP: 40.0000 mg | Freq: Once | INTRAMUSCULAR | Status: DC

## 2022-07-09 NOTE — Progress Notes (Signed)
Subjective:    Patient ID: Lauren [name], female    DOB: Feb 17, 1981, 42 y.o.   MRN: [id number]  [facility] Summary 10/19   Brief HPI:   Lauren [name] is a 42 y.o. female noted to the emergency room on 05/10/2021 with unsteady gait, left-sided facial droop and left arm weakness with slurred speech.  Code stroke called.  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  She underwent thrombectomy with TICI 3.  Cardiology consulted and recent echo revealed decrease in EF to approximately 30 to 35%.  She was noted to have elevated troponin with unremarkable EKG and was treated with nitroglycerin and heparin infusion.  ID was consulted for input due to history of left foot osteomyelitis as well as podiatry.  She underwent bone biopsy of fourth metatarsal left foot and broad-spectrum antibiotics were adjusted.  She did receive 1 unit PRBCs due to drop in H&H.  Hypercoagulable work-up was negative and she was started on Lovenox per hematology input.  Antibiotics were discontinued as cultures were negative.  Diet was advanced to dysphagia 3, leukocytosis was resolving with improvement in renal status.  PT/OT /ST was working with patient who continued to be limited by weakness as well as cognitive deficits.  CIR was recommended due to functional decline.     Hospital Course: Lauren [name] was admitted to rehab 01/28/2022 for inpatient therapies to consist of PT, ST and OT at least three hours five days a week. Past admission physiatrist, therapy team and rehab RN have worked together to provide customized collaborative inpatient rehab. Diet advanced to regular textures. Seen by podiatry 9/30 and left films obtained. Some dehiscence of incision. New wound care orders placed.  [name] saw the patient on 10/3 and allowed the patient to wear post-op shoe or CAM boot to WBAT.    Rapid response called at 1147 on 10/4 due to chest pain. NTG given and  EKG times 2 performed. No evidence of STE. Chest xray c/w volume overload and mild pulmonary edema. Cardiology resident consulted. Lasix 40 mg PO given. Creatinine bump to 2.26. Symptoms improved. BP overnight 110s-120s.  Lasix 10 mg PO daily started on 10/5. Also, hemoglobin drift to 7.0 although on recheck was 7.8. Transfused one unit PRBCs. Check FOBT and restart home Protonix. Creatinine slightly lower at 2.12. Fecal occult stool testing negative times 3 samples and follow up CBC showed H/H to be stable on DAPT as well as Lovenox for coagulopathy.  Dose has been  adjusted due to CKD.  She has had issues with left shoulder pain and sling was ordered for use as needed.  Tramadol has also been used on as needed basis for pain management.   Sudden onset of chest pain again on 10/9. NTG times 3 doses given without relief. Rapid response called. Stat EKG, troponin and CK ordered. Chest x-ray ok and trop below her baseline. Trazodone increased to 75 mg at bedtime for sleep. Increased to 100 mg on 10/12. Wanted to try something else for sleep so started on tizanidine 4 mg q HS.  Hemoglobin trending upward and 9.3 on 10/16. Lasix decreased to 20 mg daily on 10/16 due to elevation in serum creatinine. Consulted medicine service on 10/18 for assistance with kidney function, fluid balance and glucose control.  Strict I's and O's were recommended as patient did not appear to to be fluid overloaded.     Blood pressures were monitored on TID basis  and she was hypotensive on 10/2 without complaints. Midday hydralazine held. Cardiology consulted due to low EF and multiple meds. At admission, she is receiving hydralazine 12.5 mg TID, isosorbide 20 mg BID, Lopressor 37.5 mg BID but was complicated by multiple episodes of orthostatic hypotension with presyncope and cards was consulted multiple times to help with adjustment of medications. Compression with ACE wrap LE and TED hose RLE when OOB also used for BP support. [name]  felt that there was nothing further to add for patient's symptoms and recommended home with hospice. As BP stabilized, Lasix 40 mg was started for vascular congestion seen on CXR 10/5. Lopressor increase back to 37.5 mg BID.   Diabetes has been monitored with ac/hs CBG checks and SSI was use prn for tighter BS control. Levemir 25 units continued and Novolog 2 units TID with meals. Diabetic coordinator consulted for assistance with diet. Increased meal coverage to 5 units and added nighttime SSI.  Multiple changes in insulin management were made throughout her stay.  Her blood sugars continue to be poorly controlled as patient was not willing to comply with dietary restrictions during her stay.  Internal medicine recommended that patient's insulin pump be resumed as patient reported that this worked better for her.   Diabetic coordinator was consulted who reviewed endocrinology notes and pump was resumed per home regimen.  Both patient and family have been instructed on importance of diabetic management as well as dietary restrictions to provide fluid overload as well as better blood sugar control.  They are by her of need to contact endocrinology for further adjustment in her insulin protocol.  Patient has made progress during his stay but continues to be limited by left inattention, decrease in attention, problems with recall as well as overall safety awareness.  She will continue to receive further follow-up home health PT, OT, OT and RN by [facility] home health after discharge.   .   Rehab course: During patient's stay in rehab weekly team conferences were held to monitor patient's progress, set goals and discuss barriers to discharge. At admission, patient required X assist with basic self-care skills, moderate to total assist for mobility.   She has had improvement in activity tolerance, balance, postural control as well as ability to compensate for deficits. She has had improvement in functional use LUE   and LLE as well as improvement in awareness.  She is able to complete ADL tasks with min assist.  She requires supervision for transfers and min assist to ambulate 30'with RW and left hand splint.  She requires min to mod assist with verbal and visual cues to complete functional and familiar tasks safely.  She is able to utilize compensatory swallow strategies at modified independent level.  Patient's safety is also intermittently impacted by mobility.  Family has been instructed on 24 hours supervision for safety. Family education has been completed regarding all aspects of care.        Interval history 04/19/2022 [name] reports that she has been doing well overall since her discharge from CIR.  She has completed her home therapy.  She is walking much better  She sometimes needs a little assistance with dressing however is able to do it mostly on her own.  She reports she was seen by her PCP for follow-up.  She is also following with hematology.  She feels like her strength has improved significantly since her discharge.  She continues to have shoulder pain and uses tramadol ordered by her  PCP about once a day.  She reports her left leg is healing well and she is following with her surgeon for this.  She denies any issues with bowel or bladder control.  Interval history 05/31/22 [name] is here for follow-up after her CVA.  She reports she continues to have pain in her left foot due to a foot ulcer.  She is following with wound care and [name] podiatry for this issue.  She is also having severe pain in her left shoulder that is worsened with movement.  She reports that Dr. Allena Earing from neurology was considering her to be referred to PT for evaluation for possible vagal nerve stimulation treatment.  She continues to have spasticity in her left hand and wrist.  She is using oxycodone 5 mg as needed only about once a day for her shoulder and foot pain.  Pain Inventory Average Pain 5 Pain Right  Now 9 My pain is intermittent, constant, and aching  LOCATION OF PAIN  Shoulder, wrist, hand  BOWEL Number of stools per week: 2  BLADDER Normal  Frequent urination Yes    Mobility how many minutes can you walk? 3-4 ability to climb steps?  yes do you drive?  no use a wheelchair needs help with transfers Do you have any goals in this area?  yes  Function disabled: date disabled applied I need assistance with the following:  dressing, bathing, toileting, meal prep, household duties, and shopping Do you have any goals in this area?  yes  Neuro/Psych trouble walking  Prior Studies Any changes since last visit?  no  Physicians involved in your care Any changes since last visit?  no   Family History  Problem Relation Age of Onset   Thyroid disease Mother    Hypertension Mother    Hyperlipidemia Mother    CAD Mother    CVA Mother    Hyperlipidemia Father    Hypertension Father    CAD Father    Breast cancer Paternal Grandmother 66   Cancer Paternal Grandmother        lung   Social History   Socioeconomic History   Marital status: Married    Spouse name: Jerelyn Scott   Number of children: 2   Years of education: 12   Highest education level: 12th grade  Occupational History    Comment: Full time   Occupation: CNA at [employer] Use   Smoking status: Former    Packs/day: 0.25    Types: Cigarettes    Quit date: 10/27/2020    Years since quitting: 1.6    Passive exposure: Past   Smokeless tobacco: Never  Vaping Use   Vaping Use: Never used  Substance and Sexual Activity   Alcohol use: No    Comment: occ   Drug use: No   Sexual activity: Yes    Birth control/protection: Surgical    Comment: tubal   Other Topics Concern   Not on file  Social History Narrative   Lives with husband and kids   Right handed   Drinks 9+ cups caffeine daily   Social Determinants of Health   Financial Resource Strain: Low Risk  (03/03/2022)   Overall Financial  Resource Strain (CARDIA)    Difficulty of Paying Living Expenses: Not very hard  Recent Concern: Financial Resource Strain - High Risk (01/14/2022)   Overall Financial Resource Strain (CARDIA)    Difficulty of Paying Living Expenses: Hard  Food Insecurity: No Food Insecurity (03/03/2022)  Hunger Vital Sign    Worried About Running Out of Food in the Last Year: Never true    Ran Out of Food in the Last Year: Never true  Recent Concern: Food Insecurity - Food Insecurity Present (01/16/2022)   Hunger Vital Sign    Worried About Running Out of Food in the Last Year: Sometimes true    Ran Out of Food in the Last Year: Sometimes true  Transportation Needs: No Transportation Needs (03/03/2022)   PRAPARE - Hydrologist (Medical): No    Lack of Transportation (Non-Medical): No  Physical Activity: Inactive (03/03/2022)   Exercise Vital Sign    Days of Exercise per Week: 0 days    Minutes of Exercise per Session: 0 min  Stress: No Stress Concern Present (03/03/2022)   [place]    Feeling of Stress : Only a little  Recent Concern: Stress - Stress Concern Present (12/22/2021)   [place]    Feeling of Stress : To some extent  Social Connections: Socially Integrated (03/03/2022)   Social Connection and Isolation Panel [NHANES]    Frequency of Communication with Friends and Family: More than three times a week    Frequency of Social Gatherings with Friends and Family: More than three times a week    Attends Religious Services: More than 4 times per year    Active Member of Clubs or Organizations: Yes    Attends Archivist Meetings: More than 4 times per year    Marital Status: Married   Past Surgical History:  Procedure Laterality Date   ABDOMINAL AORTOGRAM W/LOWER EXTREMITY N/A 06/10/2022   Procedure: ABDOMINAL AORTOGRAM W/LOWER  EXTREMITY;  Surgeon: Marty Heck, MD;  Location: [place] [facility];  Service: Cardiovascular;  Laterality: N/A;   AMPUTATION Left 09/02/2021   Procedure: AMPUTATION RAY;  Surgeon: Criselda Peaches, DPM;  Location: [place];  Service: Podiatry;  Laterality: Left;  sagittal saw, 3L bag saline & Pulse   Cardiac catherization     CHOLECYSTECTOMY     I & D EXTREMITY Left 08/30/2021   Procedure: IRRIGATION AND DEBRIDEMENT WITH BONE BIOPSY;  Surgeon: [name], DPM;  Location: [place];  Service: Podiatry;  Laterality: Left;   IR ANGIO VERTEBRAL SEL SUBCLAVIAN INNOMINATE UNI L MOD SED  01/18/2022   IR CT HEAD LTD  01/08/2022   IR INTRA CRAN STENT  01/08/2022   IR PERCUTANEOUS ART THROMBECTOMY/INFUSION INTRACRANIAL INC DIAG ANGIO  01/08/2022   IRRIGATION AND DEBRIDEMENT FOOT Left 09/04/2021   Procedure: IRRIGATION AND DEBRIDEMENT FOOT AND CLOSURE;  Surgeon: Criselda Peaches, DPM;  Location: [place];  Service: Podiatry;  Laterality: Left;   PERIPHERAL VASCULAR BALLOON ANGIOPLASTY Left 06/10/2022   Procedure: PERIPHERAL VASCULAR BALLOON ANGIOPLASTY;  Surgeon: Marty Heck, MD;  Location: [place] [facility];  Service: Cardiovascular;  Laterality: Left;  AT and DP   PERIPHERAL VASCULAR INTERVENTION Left 06/10/2022   Procedure: PERIPHERAL VASCULAR INTERVENTION;  Surgeon: Marty Heck, MD;  Location: [place] [facility];  Service: Cardiovascular;  Laterality: Left;  SFA   RADIOLOGY WITH ANESTHESIA N/A 01/08/2022   Procedure: IR WITH ANESTHESIA;  Surgeon: Radiologist, Medication, MD;  Location: [place];  Service: Radiology;  Laterality: N/A;   THROMBECTOMY BRACHIAL ARTERY Right 11/18/2021   Procedure: RIGHT BRACHIAL, RADIAL, & ULNAR ARTERY THROMBECTOMY.;  Surgeon: Marty Heck, MD;  Location: [place];  Service: Vascular;  Laterality: Right;   TUBAL LIGATION     Past Medical History:  Diagnosis Date   Anemia    CAD (coronary artery disease)    a. s/p cath in 03/2014 showing 30% mid-LAD, moderate to  severe disease along small D1, patent LCx, moderate to severe distal OM2 stenosis and moderate diffuse diease along RCA not amenable to PCI   CHF (congestive heart failure) ([place])    a. EF 55-60% in 12/2019 b. EF at 35-40% by echo in 05/2020   CKD (chronic kidney disease) stage 4, GFR 15-29 ml/min (HCC)    Diabetes mellitus without complication (HCC)    Myocardial infarction (HCC)    Neuropathy    Stroke (HCC)    BP 131/84   Pulse 77   Ht '5\' 5"'$  (1.651 m)   SpO2 95%   BMI 30.79 kg/m   Opioid Risk Score:   Fall Risk Score:  `1  Depression screen [facility] 2/9     06/01/2022   10:43 AM 05/31/2022    3:01 PM 04/20/2022   11:41 AM 04/19/2022    2:32 PM 04/05/2022   11:05 AM 03/03/2022    1:35 PM 03/03/2022   10:08 AM  Depression screen PHQ 2/9  Decreased Interest 0 1 0 0 0 0 0  Down, Depressed, Hopeless 1 1 0 0 0 0 0  PHQ - 2 Score 1 2 0 0 0 0 0  Altered sleeping 2  0  2 0   Tired, decreased energy 0  0  0 0   Change in appetite 0  0  0 0   Feeling bad or failure about yourself  0  0  0 0   Trouble concentrating 0  0  1 0   Moving slowly or fidgety/restless 0  0  0 0   Suicidal thoughts 0  0  0 0   PHQ-9 Score 3  0  3 0     Review of Systems  Genitourinary:        Frequent void  Musculoskeletal:  Positive for gait problem.  All other systems reviewed and are negative.      Objective:   Physical Exam     07/09/2022    1:40 PM 06/22/2022    6:35 AM 06/22/2022    6:32 AM  Vitals with BMI  Height '5\' 5"'$     Weight   185 lbs  BMI   [id number]  Systolic [id number]   Diastolic 84 87   Pulse 77 80     Gen: no distress, normal appearing HEENT: oral mucosa pink and moist, NCAT Cardio: Reg rate Chest: normal effort, normal rate of breathing Abd: soft, non-distended Ext: Mild edema in her left upper extremity Psych: pleasant, normal affect Skin: intact Neuro: Alert and awake, makes eye contact, follows commands, using post-op shoe on the left, left facial weakness Strength 5 out of  5 right upper extremity and right lower extremity Strength 1-2 out of 5 left shoulder abduction and elbow extension, elbow flexion 2-3 out of 5, finger flexion 4 out of 5, she has difficulty extending the fingers Strength left lower extremity 4 out of 5 throughout -Sensation intact light touch in all 4 extremities Musculoskeletal: Subluxation left shoulder Increased tone in finger flexors MAS 2-3, elbow flexors and pronators 2, her tone decreases with stretching              Assessment & Plan:   1. Right MCA infarct status post thrombectomy,  right ICA dissection with stent placement.             -Referral to outpatient PT to work on her left upper and lower extremity function   -Appears that neurology is considering her for possible vagal nerve stimulation treatment   2 shoulder subluxation left  -Continue Voltaren gel, will schedule for shoulder injection -Patient is using occasional oxycodone from her foot pain however it is also helping her shoulder.  Think it is reasonable to continue rare use of oxycodone for both the sources of pain until they can attempt shoulder injection.  She recently received 20 tabs from her podiatrist of oxycodone 5.  She reports tramadol did not help her pain.   3 Spasticity left upper extremity.              -Continue to monitor may need Botox at a later time.  She says she will consider this   4. Hypertension             -Well-controlled continue follow-up with PCP   5: Left foot osteomyelitis s/p ray amputation  -Continue follow-up with [name], she is currently being treated for a left heel diabetic ulcer  2/13 called pt, she continues to have severe shoulder pain, occasional oxycodone helping when it is severe, she is almost out of the medication, will reorder short term oxycodone '5mg'$  q6h prn, #20

## 2022-07-10 ENCOUNTER — Encounter: Payer: Self-pay | Admitting: Physical Medicine & Rehabilitation

## 2022-07-12 ENCOUNTER — Encounter (HOSPITAL_BASED_OUTPATIENT_CLINIC_OR_DEPARTMENT_OTHER): Payer: BC Managed Care – PPO | Admitting: General Surgery

## 2022-07-12 ENCOUNTER — Telehealth: Payer: Self-pay | Admitting: Internal Medicine

## 2022-07-12 DIAGNOSIS — I5042 Chronic combined systolic (congestive) and diastolic (congestive) heart failure: Secondary | ICD-10-CM | POA: Diagnosis not present

## 2022-07-12 DIAGNOSIS — Z8673 Personal history of transient ischemic attack (TIA), and cerebral infarction without residual deficits: Secondary | ICD-10-CM | POA: Diagnosis not present

## 2022-07-12 DIAGNOSIS — I252 Old myocardial infarction: Secondary | ICD-10-CM | POA: Diagnosis not present

## 2022-07-12 DIAGNOSIS — Z794 Long term (current) use of insulin: Secondary | ICD-10-CM | POA: Diagnosis not present

## 2022-07-12 DIAGNOSIS — E669 Obesity, unspecified: Secondary | ICD-10-CM | POA: Diagnosis not present

## 2022-07-12 DIAGNOSIS — E10621 Type 1 diabetes mellitus with foot ulcer: Secondary | ICD-10-CM | POA: Diagnosis not present

## 2022-07-12 DIAGNOSIS — I130 Hypertensive heart and chronic kidney disease with heart failure and stage 1 through stage 4 chronic kidney disease, or unspecified chronic kidney disease: Secondary | ICD-10-CM | POA: Diagnosis not present

## 2022-07-12 DIAGNOSIS — E1022 Type 1 diabetes mellitus with diabetic chronic kidney disease: Secondary | ICD-10-CM | POA: Diagnosis not present

## 2022-07-12 DIAGNOSIS — Z09 Encounter for follow-up examination after completed treatment for conditions other than malignant neoplasm: Secondary | ICD-10-CM | POA: Diagnosis not present

## 2022-07-12 DIAGNOSIS — L97522 Non-pressure chronic ulcer of other part of left foot with fat layer exposed: Secondary | ICD-10-CM | POA: Diagnosis not present

## 2022-07-12 DIAGNOSIS — Z6833 Body mass index (BMI) 33.0-33.9, adult: Secondary | ICD-10-CM | POA: Diagnosis not present

## 2022-07-12 DIAGNOSIS — E10622 Type 1 diabetes mellitus with other skin ulcer: Secondary | ICD-10-CM | POA: Diagnosis not present

## 2022-07-12 DIAGNOSIS — L97425 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 without evidence of necrosis: Secondary | ICD-10-CM | POA: Diagnosis not present

## 2022-07-12 DIAGNOSIS — I2510 Atherosclerotic heart disease of native coronary artery without angina pectoris: Secondary | ICD-10-CM | POA: Diagnosis not present

## 2022-07-12 DIAGNOSIS — Z955 Presence of coronary angioplasty implant and graft: Secondary | ICD-10-CM | POA: Diagnosis not present

## 2022-07-12 DIAGNOSIS — L98499 Non-pressure chronic ulcer of skin of other sites with unspecified severity: Secondary | ICD-10-CM | POA: Diagnosis not present

## 2022-07-12 DIAGNOSIS — N184 Chronic kidney disease, stage 4 (severe): Secondary | ICD-10-CM | POA: Diagnosis not present

## 2022-07-12 NOTE — Telephone Encounter (Signed)
FMLA   Noted  Copied Sleeved  Original in provider box Copy in brown folder front desk 

## 2022-07-13 ENCOUNTER — Ambulatory Visit (HOSPITAL_COMMUNITY): Payer: BC Managed Care – PPO

## 2022-07-13 ENCOUNTER — Encounter (HOSPITAL_COMMUNITY): Payer: BC Managed Care – PPO | Admitting: Speech Pathology

## 2022-07-13 LAB — DRUG TOX MONITOR 1 W/CONF, ORAL FLD
Amphetamines: NEGATIVE ng/mL (ref <10)
Barbiturates: NEGATIVE ng/mL (ref <10)
Benzodiazepines: NEGATIVE ng/mL (ref <0.50)
Buprenorphine: NEGATIVE ng/mL (ref <0.10)
Cocaine: NEGATIVE ng/mL (ref <5.0)
Codeine: NEGATIVE ng/mL (ref <2.5)
Dihydrocodeine: NEGATIVE ng/mL (ref <2.5)
Fentanyl: NEGATIVE ng/mL (ref <0.10)
Heroin Metabolite: NEGATIVE ng/mL (ref <1.0)
Hydrocodone: NEGATIVE ng/mL (ref <2.5)
Hydromorphone: NEGATIVE ng/mL (ref <2.5)
MARIJUANA: NEGATIVE ng/mL (ref <2.5)
MDMA: NEGATIVE ng/mL (ref <10)
Meprobamate: NEGATIVE ng/mL (ref <2.5)
Methadone: NEGATIVE ng/mL (ref <5.0)
Morphine: NEGATIVE ng/mL (ref <2.5)
Nicotine Metabolite: NEGATIVE ng/mL (ref <5.0)
Norhydrocodone: NEGATIVE ng/mL (ref <2.5)
Noroxycodone: 12.3 ng/mL — ABNORMAL HIGH (ref <2.5)
Opiates: POSITIVE ng/mL — AB (ref <2.5)
Oxycodone: 15.6 ng/mL — ABNORMAL HIGH (ref <2.5)
Oxymorphone: NEGATIVE ng/mL (ref <2.5)
Phencyclidine: NEGATIVE ng/mL (ref <10)
Tapentadol: NEGATIVE ng/mL (ref <5.0)
Tramadol: NEGATIVE ng/mL (ref <5.0)
Zolpidem: NEGATIVE ng/mL (ref <5.0)

## 2022-07-13 LAB — DRUG TOX ALC METAB W/CON, ORAL FLD: Alcohol Metabolite: NEGATIVE ng/mL (ref <25)

## 2022-07-13 NOTE — Progress Notes (Signed)
Lauren, [name] ([id number]) [id number] Page 1 of 9 Visit Report for 07/12/2022 Chief Complaint Document Details Patient Name: Date of Service: Lauren [name], Lauren [name] 07/12/2022 3:00 PM Medical Record Number: [id number] Patient Account Number: [id number] Date of Birth/Sex: Treating RN: 30-Dec-1980 (42 y.o. F) Primary Care Provider: Marland Kitchen Other Clinician: Referring Provider: Treating Provider/Extender: [name] in Treatment: 7 Information Obtained from: Patient Chief Complaint Patients presents for treatment of an open diabetic ulcer on her abdomen Electronic Signature(s) Signed: 07/12/2022 5:28:14 PM By: Fredirick Maudlin MD FACS Entered By: Fredirick Maudlin on 07/12/2022 17:28:14 -------------------------------------------------------------------------------- Debridement Details Patient Name: Date of Service: Lauren [name], Lauren [name] 07/12/2022 3:00 PM Medical Record Number: [id number] Patient Account Number: [id number] Date of Birth/Sex: Treating RN: 1980/09/17 (42 y.o. Lauren [name] Primary Care Provider: Marland Kitchen Other Clinician: Referring Provider: Treating Provider/Extender: [name] in Treatment: 7 Debridement Performed for Assessment: Wound #2 [place] Performed By: Physician Fredirick Maudlin, MD Debridement Type: Debridement Severity of Tissue Pre Debridement: Fat layer exposed Level of Consciousness (Pre-procedure): Awake and Alert Pre-procedure Verification/Time Out Yes - 15:42 Taken: Start Time: 15:42 Pain Control: Lidocaine 5% topical ointment T Area Debrided (L x W): otal 1.5 (cm) x 1.4 (cm) = 2.1 (cm) Tissue and other material debrided: Non-Viable, Subcutaneous, T endon Level: Skin/Subcutaneous Tissue/Muscle Debridement Description: Excisional Instrument: Curette, Forceps, Scissors Bleeding: Minimum Hemostasis Achieved: Pressure End Time: 15:43 Procedural Pain:  0 Post Procedural Pain: 0 Response to Treatment: Procedure was tolerated well Level of Consciousness (Post- Awake and Alert procedure): Post Debridement Measurements of Total Wound Length: (cm) 1.5 Width: (cm) 1.4 Depth: (cm) 0.9 Volume: (cm) 1.484 Character of Wound/Ulcer Post Debridement: Improved Severity of Tissue Post Debridement: Fat layer exposed Post Procedure Diagnosis Same as Pre-procedure Notes Scribed for Dr. Celine Ahr by J.Scotton Electronic Signature(s) [place], [name] ([id number]) [id number] Page 2 of 9 Signed: 07/12/2022 5:54:15 PM By: Dellie Catholic RN Signed: 07/13/2022 7:40:18 AM By: Fredirick Maudlin MD FACS Entered By: Dellie Catholic on 07/12/2022 15:44:19 -------------------------------------------------------------------------------- HPI Details Patient Name: Date of Service: Lauren [name], Lauren [name] 07/12/2022 3:00 PM Medical Record Number: [id number] Patient Account Number: [id number] Date of Birth/Sex: Treating RN: 12-26-1980 (42 y.o. 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 She has been on Lovenox injections and also wears cutaneous blood sugar monitor and insulin pump. Apparently about a month ago, she developed a small lump under the skin that subsequently ruptured. This is left a small ulcer in her left lower abdomen. Her primary care provider has had her applying Neosporin and Band-Aids. According to the patient and  her daughter, there is really been no change or improvement since that time. 05/31/2022: The wound on her abdomen has a layer of eschar on it. Her podiatrist, [name], has also asked [name] to evaluate the wound on her foot. There is slough and eschar present. Apparently they had been using Iodosorb but recently changed to [facility]. 06/28/2022: Since our last visit, the patient was seen by Dr. Carlis Abbott in vascular surgery. She underwent an angiogram with angioplasty and stenting to restore inline flow to her foot. She has  now been optimized from a vascular standpoint. She has been using Santyl on her foot. The wound is a little bit larger today and there is a fair amount of nonviable tissue present. No purulent drainage. 07/05/2022: The wound looks a little bit cleaner today. There is still some nonviable tendon hanging from the wound. The culture that I took grew out a tetracycline resistant MRSA. Because she is on warfarin, has a number of allergies, and has decreased renal function, antimicrobial options are limited. Elesa Hacker was ordered, but we are still working on getting that for her. We also ordered Keystone topical antimicrobial compound, but this has not yet been delivered. 07/12/2022: There is still devitalized tendon present in the wound. The wound probes a little bit deeper and I am now hitting bone. Her Keystone antimicrobial compound has been delivered. We are still struggling to get [place]. The patient and her husband report that [name] is planning a surgical debridement in April. Electronic Signature(s) Signed: 07/12/2022 5:29:30 PM By: Fredirick Maudlin MD FACS Entered By: Fredirick Maudlin on 07/12/2022 17:29:29 -------------------------------------------------------------------------------- Physical Exam Details Patient Name: Date of Service: Lauren [name], Lauren [name] 07/12/2022 3:00 PM Medical Record Number: [id number] Patient Account Number: [id number] Date of Birth/Sex: Treating  RN: 1980/06/13 (42 y.o. F) Primary Care Provider: Marland Kitchen Other Clinician: Referring Provider: Treating Provider/Extender: [name] in Treatment: 7 Constitutional . Slightly tachycardic. . . no acute distress. Respiratory Normal work of breathing on room air. Notes 07/12/2022: There is still devitalized tendon present in the wound. The wound probes a little bit deeper and I am now hitting bone. Electronic Signature(s) Signed: 07/12/2022 5:30:27 PM By: Fredirick Maudlin MD FACS Entered By: Fredirick Maudlin on 07/12/2022 17:30:27 Physician Orders Details -------------------------------------------------------------------------------- [name] [id number] Page 3 of 9 Patient Name: Date of Service: KARENE, SCHNITTKER 07/12/2022 3:00 PM Medical Record Number: [id number] Patient Account Number: [id number] Date of Birth/Sex: Treating RN: [birth year] (42 y.o. 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 - Dr. Celine Ahr Room 3 Return A Nurse Visit: - Nurse Visit next week Anesthetic (In clinic) Topical Lidocaine 5% applied to wound bed Bathing/ Shower/ Hygiene May shower and wash wound with soap and water. Wound Treatment Wound #2 - Foot Wound Laterality: Plantar, Left Cleanser: Soap and Water 1 x  Per Day/30 Days Discharge Instructions: May shower and wash wound with dial antibacterial soap and water prior to dressing change. Cleanser: Wound Cleanser 1 x Per Day/30 Days Discharge Instructions: Cleanse the wound with wound cleanser prior to applying a clean dressing using gauze sponges, not tissue or cotton balls. Peri-Wound Care: Skin Prep (Generic) 1 x Per Day/30 Days Discharge Instructions: Use skin prep as directed Prim Dressing: Plain packing strip 1/2 (in) 1 x Per Day/30 Days ary Discharge Instructions: Cover with [employer] and Lightly pack into wound Prim Dressing: KEYSTONE 1 x Per Day/30 Days ary Discharge Instructions: Left Plantar foot Secondary Dressing: Zetuvit Plus Silicone Border Dressing 4x4 (in/in) (Generic) 1 x Per Day/30 Days Discharge Instructions: Apply silicone border over primary dressing as  directed. Electronic Signature(s) Signed: 07/12/2022 5:31:39 PM By: Fredirick Maudlin MD FACS Entered By: Fredirick Maudlin on 07/12/2022 17:31:39 -------------------------------------------------------------------------------- Problem List Details Patient Name: Date of Service: Lauren [name], Lauren [name] 07/12/2022 3:00 PM Medical Record Number: [id number] Patient Account Number: [id number] Date of Birth/Sex: Treating RN: Apr 19, 1981 (42 y.o. 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 05/31/2022 No Yes GAYNELL, STROW ([id number]) [id number] Page 4 of 9 without evidence of necrosis E10.622 Type 1 diabetes mellitus with other skin ulcer 05/21/2022 No Yes N18.4 Chronic kidney disease, stage 4 (severe) 05/21/2022 No Yes I50.42 Chronic combined systolic (congestive) and diastolic  (congestive) heart failure 05/21/2022 No Yes E66.9 Obesity, unspecified 05/21/2022 No Yes I63.9 Cerebral infarction, unspecified 05/21/2022 No Yes Inactive Problems Resolved Problems ICD-10 Code Description Active Date Resolved Date L98.499 Non-pressure chronic ulcer of skin of other sites with unspecified severity 05/21/2022 05/21/2022 Electronic Signature(s) Signed: 07/12/2022 5:26:55 PM By: Fredirick Maudlin MD FACS Entered By: Fredirick Maudlin on 07/12/2022 17:26:55 -------------------------------------------------------------------------------- Progress Note Details Patient Name: Date of Service: Lauren [name], Lauren [name] 07/12/2022 3:00 PM Medical Record Number: [id number] Patient Account Number: [id number] Date of Birth/Sex: Treating RN: [birth year] (42 y.o. 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 She has been on Lovenox injections and also wears cutaneous blood sugar monitor and insulin pump. Apparently about a month ago, she developed a small lump under the skin that subsequently ruptured. This is left a small ulcer in her left lower abdomen. Her primary care provider has had her applying Neosporin and Band-Aids. According to the patient and her daughter, there is really been no change or improvement since that time. 05/31/2022: The wound on her abdomen  has a layer of eschar on it. Her podiatrist, [name], has also asked [name] to evaluate the wound on her foot. There is slough and eschar present. Apparently they had been using Iodosorb but recently changed to [facility]. 06/28/2022: Since our last visit, the patient was seen by Dr. Carlis Abbott in vascular surgery. She underwent an angiogram with angioplasty and stenting to restore inline flow to her foot. She has now been optimized from a vascular standpoint. She has been using Santyl on her foot. The wound is a little bit larger today and there is a fair amount of nonviable tissue present. No purulent drainage. 07/05/2022: The wound looks a little bit cleaner today. There is still some nonviable tendon hanging from the wound. The culture that I took grew out a tetracycline resistant MRSA. Because she is on warfarin, has a number of allergies, and has decreased renal function, antimicrobial options are limited. Elesa Hacker was ordered, but we are still working on getting that for her. We also ordered Keystone topical antimicrobial compound, but this has not yet been delivered. JANIA, GIEGERICH ([id number]) [id number] Page 5 of 9 07/12/2022: There is still  devitalized tendon present in the wound. The wound probes a little bit deeper and I am now hitting bone. Her Keystone antimicrobial compound has been delivered. We are still struggling to get [place]. The patient and her husband report that [name] is planning a surgical debridement in April. Patient History Information obtained from Patient, Caregiver, Chart. Family History Cancer - Paternal Grandparents, Diabetes - Child,Siblings, Heart Disease - Mother,Father,Maternal Grandparents,Paternal Grandparents, Hypertension - Mother,Father, Lung Disease - Mother,Siblings, Stroke - Mother, Thyroid Problems - Mother, No family history of Hereditary Spherocytosis, Kidney Disease, Seizures, Tuberculosis. Social History Former  smoker - quit 01/2022, Marital Status - Married, Alcohol Use - Never, Drug Use - No History, Caffeine Use - Rarely. Medical History Eyes Denies history of Cataracts, Glaucoma, Optic Neuritis Ear/Nose/Mouth/Throat Denies history of Chronic sinus problems/congestion, Middle ear problems Hematologic/Lymphatic Patient has history of Anemia Cardiovascular Patient has history of Congestive Heart Failure, Coronary Artery Disease, Hypertension, Myocardial Infarction Denies history of Peripheral Arterial Disease Endocrine Patient has history of Type I Diabetes Genitourinary Denies history of End Stage Renal Disease Integumentary (Skin) Denies history of History of Burn Musculoskeletal Patient has history of Osteomyelitis - left foot Neurologic Patient has history of Neuropathy Oncologic Denies history of Received Chemotherapy, Received Radiation Psychiatric Denies history of Anorexia/bulimia, Confinement Anxiety Hospitalization/Surgery History - thrombectomy right brachial artery. - left 5th toe ray amputation. - cholecystectomy. - tubal ligation. - cardiac cath. Medical A Surgical History Notes nd Constitutional Symptoms (General Health) vitamin D deficiency Ear/Nose/Mouth/Throat frequent nose bleeds Respiratory hx pneumonia Cardiovascular hx thrombus right arm, right carotid artery occlusion, ischemic cardiomyopathy, hyperlipidemia Endocrine hypothyroidism Genitourinary CKD stage 4 Neurologic CVA, left hemiplegia Objective Constitutional Slightly tachycardic. no acute distress. Vitals Time Taken: 3:01 PM, Height: 65 in, Weight: 201 lbs, BMI: 33.4, Temperature: 97.9 F, Pulse: 101 bpm, Respiratory Rate: 16 breaths/min, Blood Pressure: 125/85 mmHg. Respiratory Normal work of breathing on room air. General Notes: 07/12/2022: There is still devitalized tendon present in the wound. The wound probes a little bit deeper and I am now hitting bone. Integumentary (Hair, Skin) Wound  #2 status is Open. Original cause of wound was Gradually Appeared. The date acquired was: 03/03/2022. The wound has been in treatment 6 weeks. The wound is located on the [place]. The wound measures 1.5cm length x 1.4cm width x 0.9cm depth; 1.649cm^2 area and 1.484cm^3 volume. There is Fat Layer (Subcutaneous Tissue) exposed. There is no tunneling or undermining noted. There is a medium amount of serosanguineous drainage noted. The wound margin is distinct with the outline attached to the wound base. There is small (1-33%) pink, pale granulation within the wound bed. There is a large (67- 100%) amount of necrotic tissue within the wound bed including Eschar and Adherent Slough. The periwound skin appearance had no abnormalities noted for texture. The periwound skin appearance had no abnormalities noted for color. The periwound skin appearance exhibited: Dry/Scaly. Periwound temperature was MAYBRIE, BRISBON ([id number]) [id number] Page 6 of 9 noted as No Abnormality. 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 There was a Excisional Skin/Subcutaneous Tissue/Muscle Debridement with a total area of  2.1 sq cm performed by Fredirick Maudlin, MD. With the following instrument(s): Curette, Forceps, and Scissors to remove Non-Viable tissue/material. Material removed includes T endon and Subcutaneous Tissue and after achieving pain control using Lidocaine 5%  topical ointment. No specimens were taken. A time out was conducted at 15:42, prior to the start of the procedure. A Minimum amount of bleeding was controlled with Pressure. The procedure was tolerated well with a pain level of 0 throughout and a pain level of 0 following the procedure. Post Debridement Measurements: 1.5cm length x 1.4cm width x 0.9cm depth; 1.484cm^3 volume. Character of Wound/Ulcer Post Debridement is improved. Severity of Tissue Post Debridement is: Fat layer exposed. Post procedure Diagnosis Wound #2: Same as Pre-Procedure General Notes: Scribed for Dr. Celine Ahr by J.Scotton. Plan Follow-up Appointments: Return Appointment in 2 weeks. - Dr. Celine Ahr Room 3 Nurse Visit: - Nurse Visit next week Anesthetic: (In clinic) Topical Lidocaine 5% applied to wound bed Bathing/ Shower/ Hygiene: May shower and wash wound with soap and water. WOUND #2: - Foot Wound Laterality: Plantar, Left Cleanser: Soap and Water 1 x Per Day/30 Days Discharge Instructions: May shower and wash wound with dial antibacterial soap and water prior to dressing change. Cleanser: Wound Cleanser 1 x Per Day/30 Days Discharge Instructions: Cleanse the wound with wound cleanser prior to applying a clean dressing using gauze sponges, not tissue or cotton balls. Peri-Wound Care: Skin Prep (Generic) 1 x Per Day/30 Days Discharge Instructions: Use skin prep as directed Prim Dressing: Plain packing strip 1/2 (in) 1 x Per Day/30 Days ary Discharge Instructions: Cover with [employer] and Lightly pack into wound Prim Dressing: KEYSTONE 1 x Per Day/30 Days ary Discharge Instructions: Left Plantar foot Secondary Dressing: Zetuvit Plus Silicone Border Dressing 4x4 (in/in) (Generic) 1 x Per Day/30 Days Discharge Instructions: Apply silicone border over primary dressing as directed. 07/12/2022: There is still devitalized tendon present in the wound. The wound probes a little bit deeper and I am now hitting bone. I used a  curette along with scissors and forceps to debride subcutaneous tissue and tendon from the wound. We are going to use her Keystone topical antibiotic compound to moisten plain gauze packing strips and packed these into the wound. We will cover the opening with a foam border dressing. I agree that a surgical debridement would be beneficial. I have communicated as much via a staff message in Epic to [name]. As her husband is able to provide adequate wound care and we are limited in our provider availability next week, we will have her follow-up in 2 weeks' time. Electronic Signature(s) Signed: 07/12/2022 5:34:50 PM By: Fredirick Maudlin MD FACS Entered By: Fredirick Maudlin on 07/12/2022 17:34:49 -------------------------------------------------------------------------------- HxROS Details Patient Name: Date of Service: Lauren [name], Lauren [name] 07/12/2022 3:00 PM [name] [id number] Page 7 of 9 Medical Record Number: [id number] Patient Account Number: [id number] Date of Birth/Sex: Treating RN: 1980-12-22 (42 y.o. F) Primary Care Provider: Marland Kitchen Other Clinician: Referring Provider: Treating Provider/Extender: [name] in Treatment: 7 Information Obtained From Patient Caregiver Chart Constitutional Symptoms (General Health) Medical History: Past Medical History Notes: vitamin D deficiency Eyes Medical History: Negative for: Cataracts; Glaucoma; Optic Neuritis Ear/Nose/Mouth/Throat Medical History: Negative for: Chronic sinus problems/congestion; Middle ear problems Past Medical History Notes: frequent nose bleeds Hematologic/Lymphatic Medical History: Positive for: Anemia Respiratory Medical History: Past Medical History Notes: hx pneumonia Cardiovascular Medical History: Positive for: Congestive Heart Failure; Coronary Artery Disease; Hypertension; Myocardial Infarction Negative for: Peripheral  Arterial Disease Past  Medical History Notes: hx thrombus right arm, right carotid artery occlusion, ischemic cardiomyopathy, hyperlipidemia Endocrine Medical History: Positive for: Type I Diabetes Past Medical History Notes: hypothyroidism Time with diabetes: 30 yrs Treated with: Insulin Blood sugar tested every day: Yes Tested : has dexcom Genitourinary Medical History: Negative for: End Stage Renal Disease Past Medical History Notes: CKD stage 4 Integumentary (Skin) Medical History: Negative for: History of Burn Musculoskeletal Medical History: Positive for: Osteomyelitis - left foot Neurologic Medical History: Positive for: Neuropathy Past Medical History Notes: CVA, left hemiplegia MIMA, EBBS ([id number]) [phone].pdf Page 8 of 9 Oncologic Medical History: Negative for: Received Chemotherapy; Received Radiation Psychiatric Medical History: Negative for: Anorexia/bulimia; Confinement Anxiety Immunizations Pneumococcal Vaccine: Received Pneumococcal Vaccination: No Implantable Devices None Hospitalization / Surgery History Type of Hospitalization/Surgery thrombectomy right brachial artery left 5th toe ray amputation cholecystectomy tubal ligation cardiac cath Family and Social History Cancer: Yes - Paternal Grandparents; Diabetes: Yes - Child,Siblings; Heart Disease: Yes - Mother,Father,Maternal Grandparents,Paternal Grandparents; Hereditary Spherocytosis: No; Hypertension: Yes - Mother,Father; Kidney Disease: No; Lung Disease: Yes - Mother,Siblings; Seizures: No; Stroke: Yes - Mother; Thyroid Problems: Yes - Mother; Tuberculosis: No; Former smoker - quit 01/2022; Marital Status - Married; Alcohol Use: Never; Drug Use: No History; Caffeine Use: Rarely; Financial Concerns: No; Food, Clothing or Shelter Needs: No; Support System Lacking: No; Transportation Concerns: No Electronic Signature(s) Signed: 07/13/2022 7:40:18 AM By: Fredirick Maudlin MD FACS Entered By: Fredirick Maudlin on 07/12/2022 17:30:05 -------------------------------------------------------------------------------- SuperBill Details Patient Name: Date of Service: Lauren [name], Lauren [name] 07/12/2022 Medical Record Number: [id number] Patient Account Number: [id number] Date of Birth/Sex: Treating RN: Sep 04, 1980 (42 y.o. 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 without evidence of necrosis E10.622 Type 1 diabetes mellitus with other skin ulcer N18.4 Chronic kidney disease, stage 4 (severe) I50.42 Chronic combined systolic (congestive) and diastolic (congestive) heart failure E66.9 Obesity, unspecified I63.9 Cerebral infarction, unspecified Facility Procedures : CPT4 Code: [id number] Description: [id number] - DEB MUSC/FASCIA 20 SQ CM/< ICD-10 Diagnosis Description 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 wi Modifier: thout evidence of ne Quantity: 1 crosis Physician Procedures : CPT4 Code Description Modifier [id number] - WC PHYS LEVEL 4 - EST PT 25 ICD-10 Diagnosis Description CELIDA, HOEFLING ([id number]) [id number] 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

## 2022-07-13 NOTE — Progress Notes (Signed)
[name], [name] ([id number]) [id number] Page 1 of 7 Visit Report for 07/12/2022 Arrival Information Details Patient Name: Date of Service: Lauren [name], Lauren [name] 07/12/2022 3:00 PM Medical Record Number: [id number] Patient Account Number: [id number] Date of Birth/Sex: Treating [name]: 02/01/1981 (42 yWrighto. Lauren [name] Primary Care Lauren [name]: Lauren Kitchen Other Clinician: Referring Lauren [name]: Treating Lauren [name]/Extender: Lauren [name] in Treatment: 7 Visit Information History Since Last Visit Added or deleted any medications: No Patient Arrived: Ambulatory Any new allergies or adverse reactions: No Arrival Time: 15:01 Had a fall or experienced change in No Accompanied By: spouse activities of daily living that may affect Transfer Assistance: Manual risk of falls: Patient Identification Verified: Yes Signs or symptoms of abuse/neglect since last visito No Patient Requires Transmission-Based Precautions: No Hospitalized since last visit: No Patient Has Alerts: Yes Implantable device outside of the clinic excluding No Patient Alerts: Patient on Blood Thinner cellular tissue based products placed in the center On Warfarin since last visit: Has Dressing in Place as Prescribed: Yes Pain Present Now: Yes Electronic Signature(s) Signed: 07/12/2022 5:54:15 PM By: Lauren [name] Entered By: Lauren [name] on 07/12/2022 15:06:37 -------------------------------------------------------------------------------- Encounter Discharge Information Details Patient Name: Date of Service: Lauren [name], Lauren [name] 07/12/2022 3:00 PM Medical Record Number: [id number] Patient Account Number: [id number] Date of Birth/Sex: Treating [name]: 16-Jun-1980 (17 yWrighto. Lauren [name] Primary Care Lauren [name]: Lauren Kitchen Other Clinician: Referring Lauren [name]: Treating Lauren [name]/Extender: Lauren [name] in Treatment: 7 Encounter Discharge Information  Items Post Procedure Vitals Discharge Condition: Stable Temperature (F): 97[name]9 Ambulatory Status: Ambulatory Pulse (bpm): 101 Discharge Destination: Home Respiratory Rate (breaths/min): 16 Transportation: Private Auto Blood Pressure (mmHg): 125/85 Accompanied By: spouse Schedule Follow-up Appointment: Yes Clinical Summary of Care: Patient Declined Electronic Signature(s) Signed: 07/12/2022 5:54:15 PM By: Lauren [name] Entered By: Lauren [name] on 07/12/2022 17:16:59 -------------------------------------------------------------------------------- Lower Extremity Assessment Details Patient Name: Date of Service: Lauren [name], Lauren [name] 07/12/2022 3:00 PM Medical Record Number: [id number] Patient Account Number: [id number] Date of Birth/Sex: Treating [name]: 04/28/1981 (35 yWrighto. Lauren [name] Primary Care Aamori Mcmasters: Lauren Kitchen Other Clinician: Referring Acire Tang: Treating Demarques Pilz/Extender: Lauren [name] in Treatment: 7 Electronic Signature(s) Signed: 07/12/2022 5:54:15 PM By: Lauren [name] Lauren [name], Lauren [name] ([id number]) [id number] Page 2 of 7 Entered By: Lauren [name] on 07/12/2022 15:12:34 -------------------------------------------------------------------------------- Multi Wound Chart Details Patient Name: Date of Service: Lauren [name], Lauren [name] 07/12/2022 3:00 PM Medical Record Number: [id number] Patient Account Number: [id number] Date of Birth/Sex: Treating [name]: February 24, 1981 (20 yWrighto. F) Primary Care Abrey Bradway: Lauren Kitchen Other Clinician: Referring Carlito Bogert: Treating Ossiel Marchio/Extender: Lauren [name] in Treatment: 7 Vital Signs Height(in): 65 Pulse(bpm): 101 Weight(lbs): 201 Blood Pressure(mmHg): 125/85 Body Mass Index(BMI): 33[name]4 Temperature(F): 97[name]9 Respiratory Rate(breaths/min): 16 [2:Photos:] [N/A:N/A] Left, Plantar Foot N/A N/A Wound Location: Gradually Appeared N/A N/A Wounding  Event: Diabetic Wound/Ulcer of the Lower N/A N/A Primary Etiology: Extremity Anemia, Congestive Heart Failure, N/A N/A Comorbid History: Coronary Artery Disease, Hypertension, Myocardial Infarction, Type I Diabetes, Osteomyelitis, Neuropathy 03/03/2022 N/A N/A Date Acquired: 6 N/A N/A Weeks of Treatment: Open N/A N/A Wound Status: No N/A N/A Wound Recurrence: 1[name]5x1[name]4x0[name]9 N/A N/A Measurements L x W x D (cm) 1[name]649 N/A N/A A (cm) : rea 1[name]484 N/A N/A Volume (cm) : 41[name]70% N/A N/A % Reduction in A rea: -424[name]40% N/A N/A % Reduction in Volume: Grade 1 N/A N/A Classification: Medium N/A N/A Exudate A mount: Serosanguineous N/A N/A Exudate Type: red, [name] N/A N/A Exudate Color: Distinct, outline attached N/A N/A Wound Margin: Small (1-33%) N/A N/A Granulation A mount:  Pink, Pale N/A N/A Granulation Quality: Large (67-100%) N/A N/A Necrotic A mount: Eschar, Adherent Slough N/A N/A Necrotic Tissue: Fat Layer (Subcutaneous Tissue): Yes N/A N/A Exposed Structures: Fascia: No Tendon: No Muscle: No Joint: No Bone: No Small (1-33%) N/A N/A Epithelialization: Debridement - Excisional N/A N/A Debridement: Pre-procedure Verification/Time Out 15:42 N/A N/A Taken: Lidocaine 5% topical ointment N/A N/A Pain Control: Tendon, Subcutaneous N/A N/A Tissue Debrided: Skin/Subcutaneous Tissue/Muscle N/A N/A Level: 2[name]1 N/A N/A Debridement A (sq cm): rea Curette, Forceps, Scissors N/A N/A Instrument: Minimum N/A N/A Bleeding: Pressure N/A N/A Hemostasis A chieved: 0 N/A N/A Procedural Pain: 0 N/A N/A Post Procedural Pain: Procedure was tolerated well N/A N/A Debridement Treatment Response: 1[name]5x1[name]4x0[name]9 N/A N/A Post Debridement Measurements L x W x D (cm) [name] [phone] Page 3 of 7 1[name]484 N/A N/A Post Debridement Volume: (cm) No Abnormalities Noted N/A N/A Periwound Skin Texture: Dry/Scaly: Yes N/A  N/A Periwound Skin Moisture: No Abnormalities Noted N/A N/A Periwound Skin Color: No Abnormality N/A N/A Temperature: Debridement N/A N/A Procedures Performed: Treatment Notes Wound #2 (Foot) Wound Laterality: Plantar, Left Cleanser Soap and Water Discharge Instruction: May shower and wash wound with dial antibacterial soap and water prior to dressing change. Wound Cleanser Discharge Instruction: Cleanse the wound with wound cleanser prior to applying a clean dressing using gauze sponges, not tissue or cotton balls. Peri-Wound Care Skin Prep Discharge Instruction: Use skin prep as directed Topical Primary Dressing Plain packing strip 1/2 (in) Discharge Instruction: Cover with [employer] and Lightly pack into wound KEYSTONE Discharge Instruction: Left Plantar foot Secondary Dressing Zetuvit Plus Silicone Border Dressing 4x4 (in/in) Discharge Instruction: Apply silicone border over primary dressing as directed. Secured With Compression Wrap Compression Stockings Environmental education officer) Signed: 07/12/2022 5:27:32 PM By: Fredirick Maudlin MD FACS Entered By: Fredirick Maudlin on 07/12/2022 17:27:32 -------------------------------------------------------------------------------- Multi-Disciplinary Care Plan Details Patient Name: Date of Service: Lauren [name], Lauren [name] 07/12/2022 3:00 PM Medical Record Number: [id number] Patient Account Number: [id number] Date of Birth/Sex: Treating [name]: [birth year] (56 yWrighto. Lauren [name] Primary Care Aeon Koors: Lauren Kitchen Other Clinician: Referring Tequan Redmon: Treating Lakeith Careaga/Extender: Lauren [name] in Treatment: 7 Multidisciplinary Care Plan reviewed with physician Active Inactive Nutrition Nursing Diagnoses: Impaired glucose control: actual or potential Potential for alteratiion in Nutrition/Potential for imbalanced nutrition Goals: Patient/caregiver will maintain therapeutic glucose control Date Initiated:  05/21/2022 Target Resolution Date: 08/31/2022 Goal Status: Active Interventions: MAGDALYNN, LARGEN ([id number]) [id number] Page 4 of 7 Assess HgA1c results as ordered upon admission and as needed Assess patient nutrition upon admission and as needed per policy Provide education on elevated blood sugars and impact on wound healing Treatment Activities: Patient referred to Primary Care Physician for further nutritional evaluation : 05/21/2022 Notes: Wound/Skin Impairment Nursing Diagnoses: Impaired tissue integrity Knowledge deficit related to ulceration/compromised skin integrity Goals: Patient/caregiver will verbalize understanding of skin care regimen Date Initiated: 05/21/2022 Target Resolution Date: 08/31/2022 Goal Status: Active Ulcer/skin breakdown will have a volume reduction of 30% by week 4 Date Initiated: 05/21/2022 Target Resolution Date: 08/31/2022 Goal Status: Active Interventions: Assess patient/caregiver ability to obtain necessary supplies Assess patient/caregiver ability to perform ulcer/skin care regimen upon admission and as needed Assess ulceration(s) every visit Provide education on ulcer and skin care Treatment Activities: Skin care regimen initiated : 05/21/2022 Topical wound management initiated : 05/21/2022 Notes: Electronic Signature(s) Signed: 07/12/2022 5:54:15 PM By: Lauren [name] Entered By: Lauren [name] on 07/12/2022 17:15:27 -------------------------------------------------------------------------------- Pain Assessment Details Patient Name: Date of Service: Lauren [name], Lauren [name] 07/12/2022 3:00 PM Medical Record  Number: [id number] Patient Account Number: [id number] Date of Birth/Sex: Treating [name]: 1981-02-23 (59 yWrighto. Lauren [name] Primary Care Averi Kilty: Lauren Kitchen Other Clinician: Referring Sheron Robin: Treating Trust Leh/Extender: Lauren [name] in Treatment: 7 Active Problems Location of Pain  Severity and Description of Pain Patient Has Paino Yes Site Locations Pain Location: Generalized Pain With Dressing Change: No Duration of the Pain. Constant / Intermittento Constant Rate the pain. Current Pain Level: 6 Worst Pain Level: 10 Least Pain Level: 3 Tolerable Pain Level: 5 Character of Pain SHATARA, BUTTRUM ([id number]) [id number] Page 5 of 7 Describe the Pain: Difficult to Pinpoint Pain Management and Medication Current Pain Management: Medication: Yes Cold Application: No Rest: Yes Massage: No Activity: No TWrightEWrightNWrightS.: No Heat Application: No Leg drop or elevation: No Is the Current Pain Management Adequate: Inadequate How does your wound impact your activities of daily livingo Sleep: No Bathing: No Appetite: No Relationship With Others: No Bladder Continence: No Emotions: No Bowel Continence: No Work: No Toileting: No Drive: No Dressing: No Hobbies: No Electronic Signature(s) Signed: 07/12/2022 5:54:15 PM By: Lauren [name] Entered By: Lauren [name] on 07/12/2022 15:10:16 -------------------------------------------------------------------------------- Patient/Caregiver Education Details Patient Name: Date of Service: Lauren [name], Lauren [name] 3/11/2024andnbsp3:00 PM Medical Record Number: [id number] Patient Account Number: [id number] Date of Birth/Gender: Treating [name]: [birth year] (68 yWrighto. Lauren [name] Primary Care Physician: Lauren Kitchen Other Clinician: Referring Physician: Treating Physician/Extender: Lauren [name] in Treatment: 7 Education Assessment Education Provided To: Patient Education Topics Provided Wound/Skin Impairment: Methods: Explain/Verbal Responses: Return demonstration correctly Electronic Signature(s) Signed: 07/12/2022 5:54:15 PM By: Lauren [name] Entered By: Lauren [name] on 07/12/2022  17:15:42 -------------------------------------------------------------------------------- Wound Assessment Details Patient Name: Date of Service: Lauren [name], Lauren [name] 07/12/2022 3:00 PM Medical Record Number: [id number] Patient Account Number: [id number] Date of Birth/Sex: Treating [name]: Sep 05, 1980 (32 yWrighto. Lauren [name] Primary Care Scotlyn Mccranie: Lauren Kitchen Other Clinician: Referring Hadassah Rana: Treating Oza Oberle/Extender: Lauren [name] in Treatment: 7 Wound Status Wound Number: 2 Primary Diabetic Wound/Ulcer of the Lower Extremity Etiology: Wound Location: Left, Plantar Foot Wound Open Wounding Event: Gradually Appeared Status: Date Acquired: 03/03/2022 Comorbid Anemia, Congestive Heart Failure, Coronary Artery Disease, Weeks Of Treatment: 6 History: Hypertension, Myocardial Infarction, Type I Diabetes, Clustered Wound: No Osteomyelitis, Neuropathy Photos Lauren [name], Lauren [name] ([id number]) [id number] Page 6 of 7 Wound Measurements Length: (cm) 1[name]5 Width: (cm) 1[name]4 Depth: (cm) 0[name]9 Area: (cm) 1[name]649 Volume: (cm) 1[name]484 % Reduction in Area: 41[name]7% % Reduction in Volume: -424[name]4% Epithelialization: Small (1-33%) Tunneling: No Undermining: No Wound Description Classification: Grade 1 Wound Margin: Distinct, outline attached Exudate Amount: Medium Exudate Type: Serosanguineous Exudate Color: red, [name] Foul Odor After Cleansing: No Slough/Fibrino Yes Wound Bed Granulation Amount: Small (1-33%) Exposed Structure Granulation Quality: Pink, Pale Fascia Exposed: No Necrotic Amount: Large (67-100%) Fat Layer (Subcutaneous Tissue) Exposed: Yes Necrotic Quality: Eschar, Adherent Slough Tendon Exposed: No Muscle Exposed: No Joint Exposed: No Bone Exposed: No Periwound Skin Texture Texture Color No Abnormalities Noted: Yes No Abnormalities Noted: Yes Moisture Temperature / Pain No Abnormalities Noted: No Temperature: No  Abnormality Dry / Scaly: Yes Treatment Notes Wound #2 (Foot) Wound Laterality: Plantar, Left Cleanser Soap and Water Discharge Instruction: May shower and wash wound with dial antibacterial soap and water prior to dressing change. Wound Cleanser Discharge Instruction: Cleanse the wound with wound cleanser prior to applying a clean dressing using gauze sponges, not tissue or cotton balls. Peri-Wound Care Skin Prep Discharge Instruction: Use skin prep as directed Topical Primary Dressing Plain packing strip 1/2 (in) Discharge  Instruction: Cover with [employer] and Lightly pack into wound KEYSTONE Discharge Instruction: Left Plantar foot Secondary Dressing Zetuvit Plus Silicone Border Dressing 4x4 (in/in) Discharge Instruction: Apply silicone border over primary dressing as directed. Secured With Compression Lauren [name], Lauren [name] ([id number]) [id number] Page 7 of 7 Compression Stockings Add-Ons Electronic Signature(s) Signed: 07/12/2022 3:57:23 PM By: Worthy Rancher Signed: 07/12/2022 5:54:15 PM By: Lauren [name] Entered By: Worthy Rancher on 07/12/2022 15:10:41 -------------------------------------------------------------------------------- Vitals Details Patient Name: Date of Service: Lauren [name], Lauren [name] 07/12/2022 3:00 PM Medical Record Number: [id number] Patient Account Number: [id number] Date of Birth/Sex: Treating [name]: 01/25/1981 (48 yWrighto. Lauren [name] Primary Care Maleik Vanderzee: Lauren Kitchen Other Clinician: Referring Crystie Yanko: Treating Amethyst Gainer/Extender: Lauren [name] in Treatment: 7 Vital Signs Time Taken: 15:01 Temperature (F): 97[name]9 Height (in): 65 Pulse (bpm): 101 Weight (lbs): 201 Respiratory Rate (breaths/min): 16 Body Mass Index (BMI): 33[name]4 Blood Pressure (mmHg): 125/85 Reference Range: 80 - 120 mg / dl Electronic Signature(s) Signed: 07/12/2022 5:54:15 PM By: Lauren [name] Entered By: Lauren [name] on  07/12/2022 15:10:11

## 2022-07-14 ENCOUNTER — Other Ambulatory Visit: Payer: Self-pay

## 2022-07-15 ENCOUNTER — Telehealth: Payer: Self-pay | Admitting: *Deleted

## 2022-07-15 ENCOUNTER — Ambulatory Visit (HOSPITAL_COMMUNITY): Payer: BC Managed Care – PPO

## 2022-07-15 NOTE — Telephone Encounter (Signed)
Oral swab drug screen was consistent for prescribed medications.  ?

## 2022-07-19 ENCOUNTER — Other Ambulatory Visit: Payer: Self-pay

## 2022-07-19 ENCOUNTER — Emergency Department (HOSPITAL_COMMUNITY): Payer: BC Managed Care – PPO

## 2022-07-19 ENCOUNTER — Ambulatory Visit: Payer: BC Managed Care – PPO | Admitting: Internal Medicine

## 2022-07-19 ENCOUNTER — Encounter (HOSPITAL_COMMUNITY): Payer: Self-pay

## 2022-07-19 ENCOUNTER — Encounter (HOSPITAL_BASED_OUTPATIENT_CLINIC_OR_DEPARTMENT_OTHER): Payer: BC Managed Care – PPO | Admitting: Internal Medicine

## 2022-07-19 ENCOUNTER — Inpatient Hospital Stay (HOSPITAL_COMMUNITY)
Admission: EM | Admit: 2022-07-19 | Discharge: 2022-07-27 | DRG: 616 | Disposition: A | Discharge status: Home / Self Care | Payer: BC Managed Care – PPO | Attending: Internal Medicine | Admitting: Internal Medicine

## 2022-07-19 DIAGNOSIS — E10628 Type 1 diabetes mellitus with other skin complications: Secondary | ICD-10-CM | POA: Diagnosis present

## 2022-07-19 DIAGNOSIS — M869 Osteomyelitis, unspecified: Principal | ICD-10-CM

## 2022-07-19 DIAGNOSIS — Z823 Family history of stroke: Secondary | ICD-10-CM

## 2022-07-19 DIAGNOSIS — E1051 Type 1 diabetes mellitus with diabetic peripheral angiopathy without gangrene: Secondary | ICD-10-CM | POA: Diagnosis not present

## 2022-07-19 DIAGNOSIS — Z66 Do not resuscitate: Secondary | ICD-10-CM | POA: Diagnosis present

## 2022-07-19 DIAGNOSIS — Z5986 Financial insecurity: Secondary | ICD-10-CM

## 2022-07-19 DIAGNOSIS — Z8349 Family history of other endocrine, nutritional and metabolic diseases: Secondary | ICD-10-CM

## 2022-07-19 DIAGNOSIS — I5020 Unspecified systolic (congestive) heart failure: Secondary | ICD-10-CM | POA: Diagnosis not present

## 2022-07-19 DIAGNOSIS — E10621 Type 1 diabetes mellitus with foot ulcer: Secondary | ICD-10-CM | POA: Diagnosis present

## 2022-07-19 DIAGNOSIS — E66812 Obesity, class 2: Secondary | ICD-10-CM | POA: Diagnosis present

## 2022-07-19 DIAGNOSIS — Z8673 Personal history of transient ischemic attack (TIA), and cerebral infarction without residual deficits: Secondary | ICD-10-CM

## 2022-07-19 DIAGNOSIS — L97529 Non-pressure chronic ulcer of other part of left foot with unspecified severity: Secondary | ICD-10-CM | POA: Diagnosis not present

## 2022-07-19 DIAGNOSIS — Z803 Family history of malignant neoplasm of breast: Secondary | ICD-10-CM

## 2022-07-19 DIAGNOSIS — I2510 Atherosclerotic heart disease of native coronary artery without angina pectoris: Secondary | ICD-10-CM | POA: Diagnosis present

## 2022-07-19 DIAGNOSIS — E669 Obesity, unspecified: Secondary | ICD-10-CM | POA: Diagnosis present

## 2022-07-19 DIAGNOSIS — Z79890 Hormone replacement therapy (postmenopausal): Secondary | ICD-10-CM

## 2022-07-19 DIAGNOSIS — Z9641 Presence of insulin pump (external) (internal): Secondary | ICD-10-CM | POA: Diagnosis present

## 2022-07-19 DIAGNOSIS — I5022 Chronic systolic (congestive) heart failure: Secondary | ICD-10-CM | POA: Diagnosis present

## 2022-07-19 DIAGNOSIS — Z8614 Personal history of Methicillin resistant Staphylococcus aureus infection: Secondary | ICD-10-CM

## 2022-07-19 DIAGNOSIS — Z6832 Body mass index (BMI) 32.0-32.9, adult: Secondary | ICD-10-CM

## 2022-07-19 DIAGNOSIS — S92342A Displaced fracture of fourth metatarsal bone, left foot, initial encounter for closed fracture: Secondary | ICD-10-CM | POA: Diagnosis not present

## 2022-07-19 DIAGNOSIS — I739 Peripheral vascular disease, unspecified: Secondary | ICD-10-CM | POA: Diagnosis not present

## 2022-07-19 DIAGNOSIS — Z87891 Personal history of nicotine dependence: Secondary | ICD-10-CM

## 2022-07-19 DIAGNOSIS — I255 Ischemic cardiomyopathy: Secondary | ICD-10-CM | POA: Diagnosis present

## 2022-07-19 DIAGNOSIS — E1065 Type 1 diabetes mellitus with hyperglycemia: Secondary | ICD-10-CM | POA: Diagnosis present

## 2022-07-19 DIAGNOSIS — E782 Mixed hyperlipidemia: Secondary | ICD-10-CM | POA: Diagnosis not present

## 2022-07-19 DIAGNOSIS — Z9049 Acquired absence of other specified parts of digestive tract: Secondary | ICD-10-CM

## 2022-07-19 DIAGNOSIS — Z79899 Other long term (current) drug therapy: Secondary | ICD-10-CM

## 2022-07-19 DIAGNOSIS — I10 Essential (primary) hypertension: Secondary | ICD-10-CM | POA: Diagnosis present

## 2022-07-19 DIAGNOSIS — G8929 Other chronic pain: Secondary | ICD-10-CM | POA: Diagnosis present

## 2022-07-19 DIAGNOSIS — Z794 Long term (current) use of insulin: Secondary | ICD-10-CM | POA: Diagnosis not present

## 2022-07-19 DIAGNOSIS — D631 Anemia in chronic kidney disease: Secondary | ICD-10-CM | POA: Diagnosis not present

## 2022-07-19 DIAGNOSIS — R079 Chest pain, unspecified: Secondary | ICD-10-CM | POA: Diagnosis not present

## 2022-07-19 DIAGNOSIS — S91302A Unspecified open wound, left foot, initial encounter: Secondary | ICD-10-CM | POA: Diagnosis not present

## 2022-07-19 DIAGNOSIS — M86072 Acute hematogenous osteomyelitis, left ankle and foot: Secondary | ICD-10-CM | POA: Diagnosis not present

## 2022-07-19 DIAGNOSIS — N184 Chronic kidney disease, stage 4 (severe): Secondary | ICD-10-CM | POA: Diagnosis present

## 2022-07-19 DIAGNOSIS — E11628 Type 2 diabetes mellitus with other skin complications: Secondary | ICD-10-CM | POA: Diagnosis not present

## 2022-07-19 DIAGNOSIS — M86172 Other acute osteomyelitis, left ankle and foot: Secondary | ICD-10-CM | POA: Diagnosis not present

## 2022-07-19 DIAGNOSIS — I214 Non-ST elevation (NSTEMI) myocardial infarction: Secondary | ICD-10-CM | POA: Diagnosis present

## 2022-07-19 DIAGNOSIS — E1040 Type 1 diabetes mellitus with diabetic neuropathy, unspecified: Secondary | ICD-10-CM | POA: Diagnosis not present

## 2022-07-19 DIAGNOSIS — E1059 Type 1 diabetes mellitus with other circulatory complications: Secondary | ICD-10-CM | POA: Diagnosis present

## 2022-07-19 DIAGNOSIS — L03116 Cellulitis of left lower limb: Secondary | ICD-10-CM | POA: Diagnosis not present

## 2022-07-19 DIAGNOSIS — R0789 Other chest pain: Secondary | ICD-10-CM | POA: Diagnosis not present

## 2022-07-19 DIAGNOSIS — I130 Hypertensive heart and chronic kidney disease with heart failure and stage 1 through stage 4 chronic kidney disease, or unspecified chronic kidney disease: Secondary | ICD-10-CM | POA: Diagnosis not present

## 2022-07-19 DIAGNOSIS — E1069 Type 1 diabetes mellitus with other specified complication: Principal | ICD-10-CM | POA: Diagnosis present

## 2022-07-19 DIAGNOSIS — E1022 Type 1 diabetes mellitus with diabetic chronic kidney disease: Secondary | ICD-10-CM | POA: Diagnosis not present

## 2022-07-19 DIAGNOSIS — I252 Old myocardial infarction: Secondary | ICD-10-CM

## 2022-07-19 DIAGNOSIS — I69354 Hemiplegia and hemiparesis following cerebral infarction affecting left non-dominant side: Secondary | ICD-10-CM

## 2022-07-19 DIAGNOSIS — E1169 Type 2 diabetes mellitus with other specified complication: Secondary | ICD-10-CM | POA: Diagnosis not present

## 2022-07-19 DIAGNOSIS — I2582 Chronic total occlusion of coronary artery: Secondary | ICD-10-CM | POA: Diagnosis present

## 2022-07-19 DIAGNOSIS — Z91041 Radiographic dye allergy status: Secondary | ICD-10-CM

## 2022-07-19 DIAGNOSIS — I951 Orthostatic hypotension: Secondary | ICD-10-CM | POA: Diagnosis present

## 2022-07-19 DIAGNOSIS — Z86718 Personal history of other venous thrombosis and embolism: Secondary | ICD-10-CM

## 2022-07-19 DIAGNOSIS — Z95820 Peripheral vascular angioplasty status with implants and grafts: Secondary | ICD-10-CM

## 2022-07-19 DIAGNOSIS — L089 Local infection of the skin and subcutaneous tissue, unspecified: Secondary | ICD-10-CM | POA: Diagnosis present

## 2022-07-19 DIAGNOSIS — Z7902 Long term (current) use of antithrombotics/antiplatelets: Secondary | ICD-10-CM

## 2022-07-19 DIAGNOSIS — Z91048 Other nonmedicinal substance allergy status: Secondary | ICD-10-CM

## 2022-07-19 DIAGNOSIS — I509 Heart failure, unspecified: Secondary | ICD-10-CM | POA: Diagnosis not present

## 2022-07-19 DIAGNOSIS — E1151 Type 2 diabetes mellitus with diabetic peripheral angiopathy without gangrene: Secondary | ICD-10-CM | POA: Diagnosis not present

## 2022-07-19 DIAGNOSIS — Z83438 Family history of other disorder of lipoprotein metabolism and other lipidemia: Secondary | ICD-10-CM

## 2022-07-19 DIAGNOSIS — M86672 Other chronic osteomyelitis, left ankle and foot: Secondary | ICD-10-CM | POA: Diagnosis not present

## 2022-07-19 DIAGNOSIS — E039 Hypothyroidism, unspecified: Secondary | ICD-10-CM | POA: Diagnosis not present

## 2022-07-19 DIAGNOSIS — M86179 Other acute osteomyelitis, unspecified ankle and foot: Secondary | ICD-10-CM | POA: Diagnosis not present

## 2022-07-19 DIAGNOSIS — Z8249 Family history of ischemic heart disease and other diseases of the circulatory system: Secondary | ICD-10-CM

## 2022-07-19 DIAGNOSIS — Z7901 Long term (current) use of anticoagulants: Secondary | ICD-10-CM

## 2022-07-19 DIAGNOSIS — E119 Type 2 diabetes mellitus without complications: Secondary | ICD-10-CM | POA: Diagnosis not present

## 2022-07-19 DIAGNOSIS — Z888 Allergy status to other drugs, medicaments and biological substances status: Secondary | ICD-10-CM

## 2022-07-19 DIAGNOSIS — Z881 Allergy status to other antibiotic agents status: Secondary | ICD-10-CM

## 2022-07-19 DIAGNOSIS — Z792 Long term (current) use of antibiotics: Secondary | ICD-10-CM

## 2022-07-19 HISTORY — DX: Peripheral vascular disease, unspecified: I73.9

## 2022-07-19 LAB — BASIC METABOLIC PANEL
Anion gap: 10 (ref 5–15)
BUN: 35 mg/dL — ABNORMAL HIGH (ref 6–20)
CO2: 22 mmol/L (ref 22–32)
Calcium: 8.1 mg/dL — ABNORMAL LOW (ref 8.9–10.3)
Chloride: 102 mmol/L (ref 98–111)
Creatinine, Ser: 2.68 mg/dL — ABNORMAL HIGH (ref 0.44–1.00)
GFR, Estimated: 22 mL/min — ABNORMAL LOW (ref >60)
Glucose, Bld: 114 mg/dL — ABNORMAL HIGH (ref 70–99)
Potassium: 3.8 mmol/L (ref 3.5–5.1)
Sodium: 134 mmol/L — ABNORMAL LOW (ref 135–145)

## 2022-07-19 LAB — GLUCOSE, CAPILLARY
Glucose-Capillary: 153 mg/dL — ABNORMAL HIGH (ref 70–99)
Glucose-Capillary: 207 mg/dL — ABNORMAL HIGH (ref 70–99)
Glucose-Capillary: 255 mg/dL — ABNORMAL HIGH (ref 70–99)
Glucose-Capillary: 268 mg/dL — ABNORMAL HIGH (ref 70–99)

## 2022-07-19 LAB — PROTIME-INR
INR: 2.6 — ABNORMAL HIGH (ref 0.8–1.2)
Prothrombin Time: 27.8 seconds — ABNORMAL HIGH (ref 11.4–15.2)

## 2022-07-19 LAB — HEMOGLOBIN A1C
Hgb A1c MFr Bld: 7 % — ABNORMAL HIGH (ref 4.8–5.6)
Mean Plasma Glucose: 154 mg/dL

## 2022-07-19 LAB — CBC
HCT: 28 % — ABNORMAL LOW (ref 36.0–46.0)
Hemoglobin: 9.1 g/dL — ABNORMAL LOW (ref 12.0–15.0)
MCH: 29.1 pg (ref 26.0–34.0)
MCHC: 32.5 g/dL (ref 30.0–36.0)
MCV: 89.5 fL (ref 80.0–100.0)
Platelets: 506 10*3/uL — ABNORMAL HIGH (ref 150–400)
RBC: 3.13 MIL/uL — ABNORMAL LOW (ref 3.87–5.11)
RDW: 13.8 % (ref 11.5–15.5)
WBC: 13.6 10*3/uL — ABNORMAL HIGH (ref 4.0–10.5)
nRBC: 0 % (ref 0.0–0.2)

## 2022-07-19 LAB — HIV ANTIBODY (ROUTINE TESTING W REFLEX): HIV Screen 4th Generation wRfx: NONREACTIVE

## 2022-07-19 LAB — TROPONIN I (HIGH SENSITIVITY)
Troponin I (High Sensitivity): 198 ng/L (ref <18)
Troponin I (High Sensitivity): 187 ng/L (ref <18)

## 2022-07-19 LAB — LACTIC ACID, PLASMA: Lactic Acid, Venous: 0.7 mmol/L (ref 0.5–1.9)

## 2022-07-19 LAB — SEDIMENTATION RATE: Sed Rate: 135 mm/hr — ABNORMAL HIGH (ref 0–22)

## 2022-07-19 LAB — I-STAT BETA HCG BLOOD, ED (MC, WL, AP ONLY): I-stat hCG, quantitative: <5 m[IU]/mL (ref <5)

## 2022-07-19 LAB — C-REACTIVE PROTEIN: CRP: 6.1 mg/dL — ABNORMAL HIGH (ref <1.0)

## 2022-07-19 LAB — PREALBUMIN: Prealbumin: 16 mg/dL — ABNORMAL LOW (ref 18–38)

## 2022-07-19 MED ORDER — ISOSORBIDE MONONITRATE 20 MG PO TABS
20.0000 mg | ORAL_TABLET | Freq: Two times a day (BID) | ORAL | Status: DC
  Administered 2022-07-19 – 2022-07-27 (×15): 20 mg via ORAL
  Filled 2022-07-19 (×18): qty 1

## 2022-07-19 MED ORDER — INSULIN GLARGINE-YFGN 100 UNIT/ML ~~LOC~~ SOLN
25.0000 [IU] | Freq: Every day | SUBCUTANEOUS | Status: DC
  Administered 2022-07-19 – 2022-07-20 (×2): 25 [IU] via SUBCUTANEOUS
  Filled 2022-07-19 (×2): qty 0.25

## 2022-07-19 MED ORDER — CHLORHEXIDINE GLUCONATE CLOTH 2 % EX PADS
6.0000 | MEDICATED_PAD | Freq: Once | CUTANEOUS | Status: AC
  Administered 2022-07-19: 6 via TOPICAL

## 2022-07-19 MED ORDER — HYDRALAZINE HCL 20 MG/ML IJ SOLN
5.0000 mg | INTRAMUSCULAR | Status: DC | PRN
  Filled 2022-07-19: qty 1

## 2022-07-19 MED ORDER — ONDANSETRON HCL 4 MG/2ML IJ SOLN
4.0000 mg | Freq: Once | INTRAMUSCULAR | Status: AC
  Administered 2022-07-19: 4 mg via INTRAVENOUS
  Filled 2022-07-19: qty 2

## 2022-07-19 MED ORDER — INSULIN ASPART 100 UNIT/ML IJ SOLN
5.0000 [IU] | Freq: Three times a day (TID) | INTRAMUSCULAR | Status: DC
  Administered 2022-07-19 – 2022-07-20 (×2): 5 [IU] via SUBCUTANEOUS

## 2022-07-19 MED ORDER — CLOPIDOGREL BISULFATE 75 MG PO TABS
75.0000 mg | ORAL_TABLET | Freq: Every day | ORAL | Status: DC
  Administered 2022-07-20 – 2022-07-27 (×8): 75 mg via ORAL
  Filled 2022-07-19 (×9): qty 1

## 2022-07-19 MED ORDER — VANCOMYCIN HCL IN DEXTROSE 1-5 GM/200ML-% IV SOLN
1000.0000 mg | INTRAVENOUS | Status: DC
  Administered 2022-07-21: 1000 mg via INTRAVENOUS
  Filled 2022-07-19: qty 200

## 2022-07-19 MED ORDER — SODIUM CHLORIDE 0.9 % IV SOLN
2.0000 g | Freq: Two times a day (BID) | INTRAVENOUS | Status: DC
  Administered 2022-07-19 – 2022-07-22 (×7): 2 g via INTRAVENOUS
  Filled 2022-07-19 (×7): qty 12.5

## 2022-07-19 MED ORDER — TRAZODONE HCL 50 MG PO TABS
50.0000 mg | ORAL_TABLET | Freq: Every day | ORAL | Status: DC
  Administered 2022-07-19 – 2022-07-26 (×8): 50 mg via ORAL
  Filled 2022-07-19 (×8): qty 1

## 2022-07-19 MED ORDER — HEPARIN SODIUM (PORCINE) 5000 UNIT/ML IJ SOLN
5000.0000 [IU] | Freq: Three times a day (TID) | INTRAMUSCULAR | Status: DC
  Administered 2022-07-19 – 2022-07-20 (×3): 5000 [IU] via SUBCUTANEOUS
  Filled 2022-07-19 (×3): qty 1

## 2022-07-19 MED ORDER — ACETAMINOPHEN 325 MG PO TABS
650.0000 mg | ORAL_TABLET | Freq: Four times a day (QID) | ORAL | Status: DC | PRN
  Administered 2022-07-19 – 2022-07-27 (×6): 650 mg via ORAL
  Filled 2022-07-19 (×7): qty 2

## 2022-07-19 MED ORDER — INSULIN ASPART 100 UNIT/ML IJ SOLN
0.0000 [IU] | Freq: Three times a day (TID) | INTRAMUSCULAR | Status: DC
  Administered 2022-07-19: 5 [IU] via SUBCUTANEOUS
  Administered 2022-07-20: 3 [IU] via SUBCUTANEOUS
  Administered 2022-07-20: 5 [IU] via SUBCUTANEOUS
  Administered 2022-07-20: 9 [IU] via SUBCUTANEOUS
  Administered 2022-07-21: 5 [IU] via SUBCUTANEOUS
  Administered 2022-07-21: 2 [IU] via SUBCUTANEOUS

## 2022-07-19 MED ORDER — LEVOTHYROXINE SODIUM 25 MCG PO TABS
125.0000 ug | ORAL_TABLET | Freq: Every day | ORAL | Status: DC
  Administered 2022-07-20 – 2022-07-27 (×8): 125 ug via ORAL
  Filled 2022-07-19 (×8): qty 1

## 2022-07-19 MED ORDER — MAGNESIUM OXIDE -MG SUPPLEMENT 400 (240 MG) MG PO TABS
400.0000 mg | ORAL_TABLET | Freq: Every day | ORAL | Status: DC
  Administered 2022-07-20 – 2022-07-27 (×8): 400 mg via ORAL
  Filled 2022-07-19 (×9): qty 1

## 2022-07-19 MED ORDER — ACETAMINOPHEN 650 MG RE SUPP: 650.0000 mg | Freq: Four times a day (QID) | RECTAL | Status: DC | PRN

## 2022-07-19 MED ORDER — ONDANSETRON HCL 4 MG/2ML IJ SOLN
4.0000 mg | Freq: Four times a day (QID) | INTRAMUSCULAR | Status: DC | PRN
  Administered 2022-07-19 – 2022-07-21 (×3): 4 mg via INTRAVENOUS
  Filled 2022-07-19 (×6): qty 2

## 2022-07-19 MED ORDER — MUPIROCIN 2 % EX OINT
1.0000 | TOPICAL_OINTMENT | Freq: Two times a day (BID) | CUTANEOUS | Status: AC
  Administered 2022-07-20 – 2022-07-24 (×10): 1 via NASAL
  Filled 2022-07-19: qty 22

## 2022-07-19 MED ORDER — RANOLAZINE ER 500 MG PO TB12
500.0000 mg | ORAL_TABLET | Freq: Two times a day (BID) | ORAL | Status: DC
  Administered 2022-07-19 – 2022-07-27 (×16): 500 mg via ORAL
  Filled 2022-07-19 (×16): qty 1

## 2022-07-19 MED ORDER — INSULIN ASPART 100 UNIT/ML IJ SOLN: 0.0000 [IU] | Freq: Every day | INTRAMUSCULAR | Status: DC

## 2022-07-19 MED ORDER — MORPHINE SULFATE (PF) 4 MG/ML IV SOLN
4.0000 mg | Freq: Once | INTRAVENOUS | Status: AC
  Administered 2022-07-19: 4 mg via INTRAVENOUS
  Filled 2022-07-19: qty 1

## 2022-07-19 MED ORDER — TIZANIDINE HCL 4 MG PO TABS
4.0000 mg | ORAL_TABLET | Freq: Every day | ORAL | Status: DC
  Administered 2022-07-19 – 2022-07-26 (×8): 4 mg via ORAL
  Filled 2022-07-19 (×8): qty 1

## 2022-07-19 MED ORDER — BISACODYL 5 MG PO TBEC: 5.0000 mg | DELAYED_RELEASE_TABLET | Freq: Every day | ORAL | Status: DC | PRN

## 2022-07-19 MED ORDER — INSULIN ASPART 100 UNIT/ML IJ SOLN
0.0000 [IU] | Freq: Every day | INTRAMUSCULAR | Status: DC
  Administered 2022-07-19 – 2022-07-21 (×2): 2 [IU] via SUBCUTANEOUS

## 2022-07-19 MED ORDER — VANCOMYCIN HCL 2000 MG/400ML IV SOLN
2000.0000 mg | Freq: Once | INTRAVENOUS | Status: AC
  Administered 2022-07-19: 2000 mg via INTRAVENOUS
  Filled 2022-07-19: qty 400

## 2022-07-19 MED ORDER — OXYCODONE HCL 5 MG PO TABS
5.0000 mg | ORAL_TABLET | ORAL | Status: DC | PRN
  Administered 2022-07-19 – 2022-07-27 (×15): 5 mg via ORAL
  Filled 2022-07-19 (×15): qty 1

## 2022-07-19 MED ORDER — POLYETHYLENE GLYCOL 3350 17 G PO PACK: 17.0000 g | PACK | Freq: Every day | ORAL | Status: DC | PRN

## 2022-07-19 MED ORDER — DOCUSATE SODIUM 100 MG PO CAPS
100.0000 mg | ORAL_CAPSULE | Freq: Two times a day (BID) | ORAL | Status: DC
  Administered 2022-07-19 – 2022-07-27 (×11): 100 mg via ORAL
  Filled 2022-07-19 (×15): qty 1

## 2022-07-19 MED ORDER — ATORVASTATIN CALCIUM 40 MG PO TABS
40.0000 mg | ORAL_TABLET | Freq: Every day | ORAL | Status: DC
  Administered 2022-07-20 – 2022-07-27 (×8): 40 mg via ORAL
  Filled 2022-07-19 (×9): qty 1

## 2022-07-19 MED ORDER — HYDRALAZINE HCL 25 MG PO TABS
12.5000 mg | ORAL_TABLET | Freq: Two times a day (BID) | ORAL | Status: DC
  Administered 2022-07-19 – 2022-07-23 (×9): 12.5 mg via ORAL
  Filled 2022-07-19 (×10): qty 1

## 2022-07-19 MED ORDER — ONDANSETRON HCL 4 MG PO TABS
4.0000 mg | ORAL_TABLET | Freq: Four times a day (QID) | ORAL | Status: DC | PRN
  Administered 2022-07-25: 4 mg via ORAL
  Filled 2022-07-19: qty 1

## 2022-07-19 MED ORDER — INSULIN ASPART 100 UNIT/ML IJ SOLN
0.0000 [IU] | Freq: Three times a day (TID) | INTRAMUSCULAR | Status: DC
  Administered 2022-07-19: 8 [IU] via SUBCUTANEOUS

## 2022-07-19 MED ORDER — ALUM & MAG HYDROXIDE-SIMETH 200-200-20 MG/5ML PO SUSP
30.0000 mL | ORAL | Status: DC | PRN
  Administered 2022-07-19 – 2022-07-22 (×2): 30 mL via ORAL
  Filled 2022-07-19 (×2): qty 30

## 2022-07-19 MED ORDER — METRONIDAZOLE 500 MG/100ML IV SOLN
500.0000 mg | Freq: Two times a day (BID) | INTRAVENOUS | Status: DC
  Administered 2022-07-19 – 2022-07-22 (×6): 500 mg via INTRAVENOUS
  Filled 2022-07-19 (×6): qty 100

## 2022-07-19 MED ORDER — METOPROLOL SUCCINATE ER 50 MG PO TB24
50.0000 mg | ORAL_TABLET | Freq: Every day | ORAL | Status: DC
  Administered 2022-07-20 – 2022-07-27 (×8): 50 mg via ORAL
  Filled 2022-07-19 (×8): qty 1

## 2022-07-19 NOTE — Progress Notes (Signed)
Pharmacy Antibiotic Note  Lauren [name] is a 42 y.o. female admitted on 07/19/2022 with  osteomyelitis  for foot wound she previously received antibiotics for. Renal function slightly worse than baseline (Scr 2.68 vs baseline ~2.2). Pharmacy has been consulted for vancomycin and cefepime dosing.  Plan: Start vancomycin loading dose 2 g IV, then 1 g q48h.  AUC 447.7 Start cefepime 2g q12h IV.  Monitor for changes in renal fxn, clinical s/sx, opportunities for de-escalation.   Height: 5\' 5"  (165.1 cm) Weight: 88.5 kg (195 lb) IBW/kg (Calculated) : 57  Temp (24hrs), Avg:98.6 F (37 C), Min:98.5 F (36.9 C), Max:98.6 F (37 C)  Recent Labs  Lab 07/19/22 0533  WBC 13.6*  CREATININE 2.68*    Estimated Creatinine Clearance: 30.7 mL/min (A) (by C-G formula based on SCr of 2.68 mg/dL (H)).    Allergies  Allergen Reactions   Visipaque [Iodixanol] Nausea And Vomiting    Patient had vagal response during procedure became hypertensive, and bradycardic. CO2 used during procedure prior to contrast being used, this may be cause as well. Recommended premedicating prior to contrast being used in the future.    Wellbutrin [Bupropion] Hives   Cefepime Rash    Tolerates penicilllin   Ciprofloxacin Hcl Hives and Rash    Hives/rash at injection site; 01/15/22 tolerated IV cipro   Tape Rash    Antimicrobials this admission: N/A  Dose adjustments this admission: N/A  Microbiology results: 3/18 BCx:   Thank you for allowing pharmacy to be a part of this patient's care.  Raynald Blend 07/19/2022 9:44 AM

## 2022-07-19 NOTE — ED Provider Notes (Signed)
[facility] Provider Note   CSN: [id number] Arrival date & time: 07/19/22  [id number]     History  Chief Complaint  Patient presents with   Chest Pain    Charlieann Mccallum is a 42 y.o. 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  Patient presents to ED for evaluation of chest pain.  Patient reports that at 6:30 PM last night she began to have nausea and then 1 hour later vomited 1 time and began having associated body aches and chills.  The patient reports that her nausea then dissipated after she took a Zofran.  Patient reports that she went to bed feeling fine, woke up around 5 AM with centralized nonradiating chest pain that was not associated with shortness of breath.  Patient reports she does have a history of chest pain, this chest pain was different however she cannot explain how.  Patient states typically in the past she would take nitro however did not do so this time.  Patient is on to state that her podiatrist advised her that if she began having nausea or vomiting then she should present to ED for evaluation.  Patient has diabetic foot ulcer to left foot currently being seen by [name] with podiatry.  Patient denies fevers, shortness of breath, abdominal pain, diarrhea, cough, sore throat.  Patient reports slight purulent drainage from diabetic foot wound.  Patient reports last being seen by [name] 2 weeks ago and was provided antibiotic cream as well as an antibiotic pill that she has not been able to receive yet secondary to insurance issues.   Chest Pain Associated symptoms: nausea and vomiting   Associated symptoms: no fever and no shortness of breath        Home Medications Prior to Admission medications   Medication Sig Start Date End Date Taking? Authorizing Provider  acetaminophen (TYLENOL) 325 MG tablet Take 1-2 tablets (325-650 mg total) by mouth every 4 (four) hours as  needed for mild pain. Patient taking differently: Take 650 mg by mouth as needed for mild pain. 02/18/22   Love, Ivan Anchors, PA-C  amoxicillin-clavulanate (AUGMENTIN) 875-125 MG tablet Take 1 tablet by mouth 2 (two) times daily. 05/25/22   McDonald, Stephan Minister, DPM  atorvastatin (LIPITOR) 40 MG tablet Take 1 tablet (40 mg total) by mouth daily. Courtesy fill/ pt to get established with a primary MD for further refills 06/01/22 11/28/22  Johnette Abraham, MD  clopidogrel (PLAVIX) 75 MG tablet Take 1 tablet (75 mg total) by mouth daily. 04/29/22   Garvin Fila, MD  Continuous Blood Gluc Sensor (DEXCOM G6 SENSOR) MISC Change sensor every 10 days as directed 09/29/21   Brita Romp, NP  diclofenac Sodium (VOLTAREN ARTHRITIS PAIN) 1 % GEL Apply 2 g topically 4 (four) times daily. Patient taking differently: Apply 1 Application topically 2 (two) times daily as needed (pain). 04/19/22   Jennye Boroughs, MD  enoxaparin (LOVENOX) 150 MG/ML injection Inject 1 mL (150 mg total) into the skin daily for 10 days. 06/06/22 06/16/22  Arnoldo Lenis, MD  furosemide (LASIX) 40 MG tablet Take 40 mg daily as needed for swelling 03/05/22   Arnoldo Lenis, MD  hydrALAZINE (APRESOLINE) 25 MG tablet Take 0.5 tablets (12.5 mg total) by mouth 2 (two) times daily. 05/10/22   Arnoldo Lenis, MD  ibuprofen (ADVIL) 200 MG tablet Take 400 mg by mouth as needed for moderate pain or  mild pain.    [provider]  insulin aspart (NOVOLOG) 100 UNIT/ML injection Use with Omnipod for daily dose around 80 units daily 06/17/22   Brita Romp, NP  Insulin Disposable Pump (OMNIPOD 5 G6 PODS, GEN 5,) MISC Change pod every 48-72 hours 06/17/22   Brita Romp, NP  Insulin Disposable Pump (OMNIPOD 5 G6 PODS, GEN 5,) MISC Change pod every 48-72 hours 07/07/22   Brita Romp, NP  isosorbide mononitrate (ISMO) 20 MG tablet Take 1 tablet (20 mg total) by mouth 2 (two) times daily. Courtesy fill/ pt to get established  with a primary MD for further refills 06/01/22 11/28/22  Johnette Abraham, MD  levothyroxine (SYNTHROID) 125 MCG tablet Take 1 tablet (125 mcg total) by mouth daily at 6 (six) AM. 06/17/22 06/12/23  Brita Romp, NP  magnesium oxide (MAG-OX) 400 MG tablet Take 0.5 tablets (200 mg total) by mouth at bedtime. Patient taking differently: Take 400 mg by mouth daily. 03/12/22   Benay Pike, MD  methocarbamol (ROBAXIN) 500 MG tablet Take 1 tablet (500 mg total) by mouth every 6 (six) hours as needed for muscle spasms. Patient taking differently: Take 500 mg by mouth as needed for muscle spasms. 04/05/22   Johnette Abraham, MD  metoprolol succinate (TOPROL XL) 50 MG 24 hr tablet Take 1 tablet (50 mg total) by mouth daily. 05/10/22   Arnoldo Lenis, MD  metoprolol succinate (TOPROL-XL) 25 MG 24 hr tablet Take 25 mg by mouth in the morning and at bedtime.    [provider]  Multiple Vitamin (MULTIVITAMIN WITH MINERALS) TABS tablet Take 1 tablet by mouth daily. 02/18/22   Love, Ivan Anchors, PA-C  nitroGLYCERIN (NITROSTAT) 0.4 MG SL tablet Place 1 tablet (0.4 mg total) under the tongue every 5 (five) minutes x 3 doses as needed for chest pain. 02/18/22   Love, Ivan Anchors, PA-C  ondansetron (ZOFRAN) 4 MG tablet Take 1 tablet (4 mg total) by mouth every 8 (eight) hours as needed for nausea or vomiting. 03/01/22   Lovorn, Jinny Blossom, MD  oxyCODONE (OXY IR/ROXICODONE) 5 MG immediate release tablet Take 1 tablet (5 mg total) by mouth every 6 (six) hours as needed for severe pain. 07/08/22   McDonald, Stephan Minister, DPM  pantoprazole (PROTONIX) 40 MG tablet Take 1 tablet (40 mg total) by mouth daily. Courtesy fill/ pt to get established with a primary MD for further refills Patient taking differently: Take 40 mg by mouth daily as needed (reflux). 06/01/22 11/28/22  Johnette Abraham, MD  polyethylene glycol (MIRALAX / GLYCOLAX) 17 g packet Take 17 g by mouth 2 (two) times daily. Patient taking differently: Take 17 g by  mouth as needed for moderate constipation. 03/03/22   Bayard Hugger, NP  ramelteon (ROZEREM) 8 MG tablet Take 1 tablet (8 mg total) by mouth at bedtime. 02/19/22   Love, Ivan Anchors, PA-C  ranolazine (RANEXA) 500 MG 12 hr tablet Take 1 tablet (500 mg total) by mouth 2 (two) times daily. Courtesy fill/ pt to get established with a primary MD for further refills 06/01/22 08/30/22  Johnette Abraham, MD  SANTYL 250 UNIT/GM ointment Apply 1 Application topically daily. 05/31/22   [provider]  tiZANidine (ZANAFLEX) 4 MG tablet Take 1 tablet (4 mg total) by mouth at bedtime. Courtesy fill/ pt to get established with a primary MD for further refills 06/01/22   Johnette Abraham, MD  traZODone (DESYREL) 50 MG tablet Take 1 tablet (  50 mg total) by mouth at bedtime. 07/09/22   Jennye Boroughs, MD  Vitamin D, Ergocalciferol, (DRISDOL) 1.25 MG (50000 UNIT) CAPS capsule Take 1 capsule (50,000 Units total) by mouth every 7 (seven) days for 8 doses. 06/02/22 07/22/22  Johnette Abraham, MD  warfarin (COUMADIN) 5 MG tablet TAKE ONE TABLET BY MOUTH DAILY EXCEPT 1 AND 1/2 TABLET ON [facility] AND SATURDA[facility] OR AS DIRECTED 06/15/22   Arnoldo Lenis, MD      Allergies    Visipaque [iodixanol], Wellbutrin [bupropion], Cefepime, Ciprofloxacin hcl, and Tape    Review of Systems   Review of Systems  Constitutional:  Positive for chills. Negative for fever.  Respiratory:  Negative for shortness of breath.   Cardiovascular:  Positive for chest pain.  Gastrointestinal:  Positive for nausea and vomiting.  Musculoskeletal:  Positive for myalgias.  Skin:  Positive for wound.  All other systems reviewed and are negative.   Physical Exam Updated Vital Signs BP (!) 130/92   Pulse 98   Temp 97.6 F (36.4 C) (Oral)   Resp 20   Ht 5\' 5"  (1.651 m)   Wt 88.5 kg   LMP 07/18/2022 (Approximate)   SpO2 99%   BMI 32.45 kg/m  Physical Exam Vitals and nursing note reviewed.  Constitutional:      General: She is not  in acute distress.    Appearance: Normal appearance. She is not ill-appearing, toxic-appearing or diaphoretic.  HENT:     Head: Normocephalic and atraumatic.     Nose: Nose normal.     Mouth/Throat:     Mouth: Mucous membranes are moist.     Pharynx: Oropharynx is clear.  Eyes:     Extraocular Movements: Extraocular movements intact.     Conjunctiva/sclera: Conjunctivae normal.     Pupils: Pupils are equal, round, and reactive to light.  Cardiovascular:     Rate and Rhythm: Normal rate and regular rhythm.  Pulmonary:     Effort: Pulmonary effort is normal.     Breath sounds: Normal breath sounds. No wheezing.  Abdominal:     General: Abdomen is flat. Bowel sounds are normal.     Palpations: Abdomen is soft.     Tenderness: There is no abdominal tenderness.  Musculoskeletal:     Cervical back: Normal range of motion and neck supple.     Comments: 3x2 diabetic foot wound with surrounding erythema, no fluctuance, brown-yellow drainage on gauze covering  Skin:    General: Skin is warm and dry.     Capillary Refill: Capillary refill takes less than 2 seconds.  Neurological:     Mental Status: She is alert and oriented to person, place, and time.       ED Results / Procedures / Treatments   Labs (all labs ordered are listed, but only abnormal results are displayed) Labs Reviewed  BASIC METABOLIC PANEL - Abnormal; Notable for the following components:      Result Value   Sodium 134 (*)    Glucose, Bld 114 (*)    BUN 35 (*)    Creatinine, Ser 2.68 (*)    Calcium 8.1 (*)    GFR, Estimated 22 (*)    All other components within normal limits  CBC - Abnormal; Notable for the following components:   WBC 13.6 (*)    RBC 3.13 (*)    Hemoglobin 9.1 (*)    HCT 28.0 (*)    Platelets 506 (*)    All other components within normal  limits  PROTIME-INR - Abnormal; Notable for the following components:   Prothrombin Time 27.8 (*)    INR 2.6 (*)    All other components within normal  limits  TROPONIN I (HIGH SENSITIVITY) - Abnormal; Notable for the following components:   Troponin I (High Sensitivity) 198 (*)    All other components within normal limits  TROPONIN I (HIGH SENSITIVITY) - Abnormal; Notable for the following components:   Troponin I (High Sensitivity) 187 (*)    All other components within normal limits  CULTURE, BLOOD (ROUTINE X 2)  CULTURE, BLOOD (ROUTINE X 2)  LACTIC ACID, PLASMA  LACTIC ACID, PLASMA  I-STAT BETA HCG BLOOD, ED ([facility], [facility], [facility] ONLY)    EKG EKG Interpretation  Date/Time:  Monday July 19 2022 08:19:55 EDT Ventricular Rate:  92 PR Interval:  143 QRS Duration: 99 QT Interval:  380 QTC Calculation: 471 R Axis:   68 Text Interpretation: Sinus rhythm Probable left atrial enlargement Anterolateral infarct, age indeterminate No significant change since last tracing Confirmed by Isla Pence [phone]) on 07/19/2022 8:22:55 AM  Radiology MR FOOT LEFT WO CONTRAST  Result Date: 07/19/2022 CLINICAL DATA:  Left foot wound EXAM: MRI OF THE LEFT FOOT WITHOUT CONTRAST TECHNIQUE: Multiplanar, multisequence MR imaging of the left forefoot was performed. No intravenous contrast was administered. COMPARISON:  X-ray 07/19/2022, MRI 01/02/2022 FINDINGS: Bones/Joint/Cartilage Prior transmetatarsal amputation of the fifth ray. 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 Displaced ununited fracture of the distal shaft of the fourth metatarsal without residual bone marrow edema. Minimal bone marrow edema of the fourth metatarsal base, nonspecific and may be reactive. Trace fluid in the fourth and fifth tarsometatarsal joints. Remaining osseous structures are within normal limits. No additional fractures. No dislocation. Ligaments Intact Lisfranc ligament. Remaining collateral ligaments are intact. Muscles and Tendons Denervation  changes of the foot musculature.  No tenosynovitis. Soft tissues Wound or ulceration at the lateral foot adjacent to the fifth metatarsal base. Generalized soft tissue swelling and edema. No organized or drainable fluid collections. IMPRESSION: 1. Prior transmetatarsal amputation of the fifth ray. Wound or ulceration at the lateral foot adjacent to the fifth metatarsal base. 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 2. Minimal bone marrow edema of the fourth metatarsal base, nonspecific and may be reactive. Trace fluid in the fourth and fifth tarsometatarsal joints, likely reactive although developing septic arthritis not excluded. 3. Generalized soft tissue edema of the left foot. No organized or drainable fluid collections. 4. Displaced ununited fracture of the distal shaft of the fourth metatarsal without residual bone marrow edema. Electronically Signed   By: Davina Poke D.O.   On: 07/19/2022 10:33   DG Foot Complete Left  Result Date: 07/19/2022 CLINICAL DATA:  Lateral foot wound, concern for osteomyelitis, pain, previous fifth toe amputation April 2023 EXAM: LEFT FOOT - COMPLETE 3+ VIEW COMPARISON:  01/30/2022 FINDINGS: Previous left fifth toe amputation at the level of the fifth metatarsal mid shaft as before. Lateral foot soft tissue wound over the fifth metatarsal base area. There is underlying fifth metatarsal base cortical irregularity and subcortical lucency when compared to the prior study suspicious for osteomyelitis. Healing left fourth metatarsal fracture with callus formation. Peripheral vascular calcifications noted. No joint abnormality. IMPRESSION: 1. Findings suspicious for left fifth metatarsal base osteomyelitis with overlying soft tissue wound. 2. Healing left  fourth metatarsal fracture. 3. Previous left fifth toe amputation. Electronically Signed   By: Jerilynn Mages.  Shick M.D.   On: 07/19/2022 08:47   DG Chest 2 View  Result Date:  07/19/2022 CLINICAL DATA:  42 year old female with history of chest pain. EXAM: CHEST - 2 VIEW COMPARISON:  Chest x-ray 02/08/2022. FINDINGS: Lung volumes are low. No consolidative airspace disease. No pleural effusions. No pneumothorax. No pulmonary nodule or mass noted. Pulmonary vasculature and the cardiomediastinal silhouette are within normal limits. IMPRESSION: 1. Low lung volumes without radiographic evidence of acute cardiopulmonary disease. Electronically Signed   By: Vinnie Langton M.D.   On: 07/19/2022 06:04    Procedures Procedures   Medications Ordered in ED Medications  vancomycin (VANCOREADY) IVPB 2000 mg/400 mL (2,000 mg Intravenous New Bag/Given 07/19/22 1121)  ceFEPIme (MAXIPIME) 2 g in sodium chloride 0.9 % 100 mL IVPB (0 g Intravenous Stopped 07/19/22 1118)  vancomycin (VANCOCIN) IVPB 1000 mg/200 mL premix (has no administration in time range)  morphine (PF) 4 MG/ML injection 4 mg (has no administration in time range)  ondansetron (ZOFRAN) injection 4 mg (has no administration in time range)    ED Course/ Medical Decision Making/ A&P  Medical Decision Making Amount and/or Complexity of Data Reviewed Labs: ordered. Radiology: ordered.  Risk Prescription drug management. Decision regarding hospitalization.   31-year-old female presents ED for evaluation.  Please see HPI for further details.  On examination the patient is afebrile and nontachycardic.  Lung sounds are clear bilaterally, she is not hypoxic.  Abdomen soft and compressible throughout.  Normal neurological examination.  Patient has 3 x 2 cm diabetic foot wound to left lateral portion of foot near base of left fifth metatarsal with surrounding erythema, no fluctuance, there is brown to yellow drainage on the gauze covering.  Concerning for worsening infection.  Will collect imaging of foot.  Patient workup will include CBC, BMP, troponin x 2, EKG.  CBC with leukocytosis of 13.  Hemoglobin of 9.  Hemoglobin 1  month ago to 11, patient reports she is compliant on blood thinner.  Patient denies any dark tarry stool, blood in stool.  Patient PT/INR appropriately elevated secondary to anticoagulation.  Patient BMP with slightly decreased sodium 134, baseline creatinine.  Patient troponin 198-187.  Patient denies any active chest pain at this time.  Patient EKG unchanged from prior visits.  Patient plain film imaging of chest shows no consolidations or effusions.  Patient plain film imaging of left foot does show evidence of possible osteomyelitis.  Will proceed with MRI imaging at this time.  Will add on blood cultures, lactic.  Will consult with podiatry.  Lactic not elevated at 0.7.  Discussed patient case with Dr. Jacqualyn Posey podiatry who states he would like the patient admitted and he will come see the patient later today.  Dr. Jacqualyn Posey has requested broad-spectrum antibiotics to include cefepime, vancomycin.  These been ordered.  MRI does show acute osteomyelitis of left fifth metatarsal.  Patient case discussed with Karmen Bongo, [facility] hospitalist, who has agreed to admit the patient.  Patient stable at time of admission.  Patient amenable to plan.   Final Clinical Impression(s) / ED Diagnoses Final diagnoses:  Osteomyelitis, unspecified site, unspecified type [facility])    Rx / DC Orders ED Discharge Orders     None         Lawana Chambers 07/19/22 1134    Isla Pence, MD 07/19/22 1416

## 2022-07-19 NOTE — Consult Note (Addendum)
[facility] Nurse Consult Note: Reason for Consult: Consult requested for left foot wound.   Performed remotely after review of progress notes and photos in the EMR.  Left plantar foot with a full thickness wound; yellow and moist.  She has been followed by podiatry prior to admission.  However, today the MRI results indicate osteomyelitis.  Secure chat message sent to the primary team: "[place] consult requested for foot wound.  According to MRI, "Acute osteomyelitis of the residual fifth metatarsal, most pronounced at the fifth metatarsal base."  This is complex medical condition is beyond the scope of practice for [facility] nurses.  Please consult podiatry or ortho team for further plan of care.  Thank-you Please re-consult if further assistance is needed.  Thank-you,  Julien Girt MSN, [place], [place], [place], [place]

## 2022-07-19 NOTE — ED Notes (Signed)
ED TO INPATIENT HANDOFF REPORT  ED Nurse Name and Phone #: [phone]  S Name/Age/Gender Lauren [name] 42 y.o. female Room/Bed: 037C/037C  Code Status   Code Status: DNR  Home/SNF/Other Home Patient oriented to: self, place, time, and situation Is this baseline? Yes   Triage Complete: Triage complete  Chief Complaint Diabetic foot infection ([place]) [id number], L08.9]  Triage Note Pt arrived to triage complaining of N/V and chest pain that started last night around 10PM  Pt states that she has CHF and states she has noticed some swelling lately.   Pt also has a known foot wound that she is on antibiotics for but is supposed to be a second.  States she was told to come to ED if she started to feel ill or have fevers    Allergies Allergies  Allergen Reactions   Visipaque [Iodixanol] Nausea And Vomiting    Patient had vagal response during procedure became hypertensive, and bradycardic. CO2 used during procedure prior to contrast being used, this may be cause as well. Recommended premedicating prior to contrast being used in the future.    Wellbutrin [Bupropion] Hives   Cefepime Rash    Tolerates penicilllin   Ciprofloxacin Hcl Hives and Rash    Hives/rash at injection site; 01/15/22 tolerated IV cipro   Tape Rash    Level of Care/Admitting Diagnosis ED Disposition     ED Disposition  Admit   Condition  --   Comment  Hospital Area: [facility] [[id number]]  Level of Care: Med-Surg [16]  May admit patient to [facility] or [facility] if equivalent level of care is available:: No  Covid Evaluation: Asymptomatic - no recent exposure (last 10 days) testing not required  Diagnosis: Diabetic foot infection [facility]) [id number]  Admitting Physician: Karmen Bongo [[id number]]  Attending Physician: Karmen Bongo [id number]  Certification:: I certify this patient will need inpatient services for at least 2 midnights  Estimated Length of Stay: 4           B Medical/Surgery History Past Medical History:  Diagnosis Date   Anemia    CAD (coronary artery disease)    a. s/p cath in 03/2014 showing 30% mid-LAD, moderate to severe disease along small D1, patent LCx, moderate to severe distal OM2 stenosis and moderate diffuse diease along RCA not amenable to PCI   CHF (congestive heart failure) ([place])    a. EF 55-60% in 12/2019 b. EF at 35-40% by echo in 05/2020   CKD (chronic kidney disease) stage 4, GFR 15-29 ml/min (HCC)    Diabetes mellitus without complication ([place])    Myocardial infarction ([place])    Neuropathy    PVD (peripheral vascular disease) ([place])    Stroke ([place]) 01/2022   L hand weakness   Past Surgical History:  Procedure Laterality Date   ABDOMINAL AORTOGRAM W/LOWER EXTREMITY N/A 06/10/2022   Procedure: ABDOMINAL AORTOGRAM W/LOWER EXTREMITY;  Surgeon: Marty Heck, MD;  Location: [place] [facility];  Service: Cardiovascular;  Laterality: N/A;   AMPUTATION Left 09/02/2021   Procedure: AMPUTATION RAY;  Surgeon: Criselda Peaches, DPM;  Location: [place];  Service: Podiatry;  Laterality: Left;  sagittal saw, 3L bag saline & Pulse   Cardiac catherization     CHOLECYSTECTOMY     I & D EXTREMITY Left 08/30/2021   Procedure: IRRIGATION AND DEBRIDEMENT WITH BONE BIOPSY;  Surgeon: [name], DPM;  Location: [place];  Service: Podiatry;  Laterality: Left;   IR ANGIO VERTEBRAL SEL SUBCLAVIAN  INNOMINATE UNI L MOD SED  01/18/2022   IR CT HEAD LTD  01/08/2022   IR INTRA CRAN STENT  01/08/2022   IR PERCUTANEOUS ART THROMBECTOMY/INFUSION INTRACRANIAL INC DIAG ANGIO  01/08/2022   IRRIGATION AND DEBRIDEMENT FOOT Left 09/04/2021   Procedure: IRRIGATION AND DEBRIDEMENT FOOT AND CLOSURE;  Surgeon: Criselda Peaches, DPM;  Location: [place];  Service: Podiatry;  Laterality: Left;   PERIPHERAL VASCULAR BALLOON ANGIOPLASTY Left 06/10/2022   Procedure: PERIPHERAL VASCULAR BALLOON ANGIOPLASTY;  Surgeon: Marty Heck, MD;  Location: [place] [facility];   Service: Cardiovascular;  Laterality: Left;  AT and DP   PERIPHERAL VASCULAR INTERVENTION Left 06/10/2022   Procedure: PERIPHERAL VASCULAR INTERVENTION;  Surgeon: Marty Heck, MD;  Location: [place] [facility];  Service: Cardiovascular;  Laterality: Left;  SFA   RADIOLOGY WITH ANESTHESIA N/A 01/08/2022   Procedure: IR WITH ANESTHESIA;  Surgeon: Radiologist, Medication, MD;  Location: [facility];  Service: Radiology;  Laterality: N/A;   THROMBECTOMY BRACHIAL ARTERY Right 11/18/2021   Procedure: RIGHT BRACHIAL, RADIAL, & ULNAR ARTERY THROMBECTOMY.;  Surgeon: Marty Heck, MD;  Location: [facility];  Service: Vascular;  Laterality: Right;   TUBAL LIGATION       A IV Location/Drains/Wounds Patient Lines/Drains/Airways Status     Active Line/Drains/Airways     Name Placement date Placement time Site Days   Peripheral IV 07/19/22 22 G 1" Anterior;Right Forearm 07/19/22  0917  Forearm  less than 1   Peripheral IV 07/19/22 22 G 1" Posterior;Right Hand 07/19/22  0935  Hand  less than 1   Wound / Incision (Open or Dehisced) 01/28/22 Irritant Dermatitis (Moisture Associated Skin Damage) Sacrum Posterior;Upper slightly red, little skin breakdown 01/28/22  1653  Sacrum  172   Wound / Incision (Open or Dehisced) Other (Comment) Foot Left;Posterior --  --  Foot  --            Intake/Output Last 24 hours No intake or output data in the 24 hours ending 07/19/22 1403  Labs/Imaging Results for orders placed or performed during the hospital encounter of 07/19/22 (from the past 48 hour(s))  Basic metabolic panel     Status: Abnormal   Collection Time: 07/19/22  5:33 AM  Result Value Ref Range   Sodium 134 (L) 135 - 145 mmol/L   Potassium 3.8 3.5 - 5.1 mmol/L   Chloride 102 98 - 111 mmol/L   CO2 22 22 - 32 mmol/L   Glucose, Bld 114 (H) 70 - 99 mg/dL    Comment: Glucose reference range applies only to samples taken after fasting for at least 8 hours.   BUN 35 (H) 6 - 20 mg/dL   Creatinine, Ser  2.68 (H) 0.44 - 1.00 mg/dL   Calcium 8.1 (L) 8.9 - 10.3 mg/dL   GFR, Estimated 22 (L) >60 mL/min    Comment: (NOTE) Calculated using the CKD-EPI Creatinine Equation (2021)    Anion gap 10 5 - 15    Comment: Performed at [facility] [address] [place] [address]  CBC     Status: Abnormal   Collection Time: 07/19/22  5:33 AM  Result Value Ref Range   WBC 13.6 (H) 4.0 - 10.5 K/uL   RBC 3.13 (L) 3.87 - 5.11 MIL/uL   Hemoglobin 9.1 (L) 12.0 - 15.0 g/dL   HCT 28.0 (L) 36.0 - 46.0 %   MCV 89.5 80.0 - 100.0 fL   MCH 29.1 26.0 - 34.0 pg  MCHC 32.5 30.0 - 36.0 g/dL   RDW 13.8 11.5 - 15.5 %   Platelets 506 (H) 150 - 400 K/uL   nRBC 0.0 0.0 - 0.2 %    Comment: Performed at [facility], [place] [address] [place], [place] [address]  Troponin I (High Sensitivity)     Status: Abnormal   Collection Time: 07/19/22  5:33 AM  Result Value Ref Range   Troponin I (High Sensitivity) 198 (HH) <18 ng/L    Comment: CRITICAL RESULT CALLED TO, READ BACK BY AND VERIFIED WITH [place] PARAMEDIC 07/19/22 [id number] Jerilynn Mages KOROLESKI (NOTE) Elevated high sensitivity troponin I (hsTnI) values and significant  changes across serial measurements may suggest ACS but many other  chronic and acute conditions are known to elevate hsTnI results.  Refer to the "Links" section for chest pain algorithms and additional  guidance. Performed at [facility], [place] [address] [place], [place] [address]   I-Stat beta hCG blood, ED     Status: None   Collection Time: 07/19/22  6:22 AM  Result Value Ref Range   I-stat hCG, quantitative <5.0 <5 mIU/mL   Comment 3            Comment:   GEST. AGE      CONC.  (mIU/mL)   <=1 WEEK        5 - 50     2 WEEKS       50 - 500     3 WEEKS       100 - 10,000     4 WEEKS     1,000 - 30,000        FEMALE AND NON-PREGNANT FEMALE:     LESS THAN 5 mIU/mL   Protime-INR     Status: Abnormal   Collection Time: 07/19/22  6:35 AM  Result Value Ref Range    Prothrombin Time 27.8 (H) 11.4 - 15.2 seconds   INR 2.6 (H) 0.8 - 1.2    Comment: (NOTE) INR goal varies based on device and disease states. Performed at [facility], [place] [address], [place], [place] [address]   Troponin I (High Sensitivity)     Status: Abnormal   Collection Time: 07/19/22  6:45 AM  Result Value Ref Range   Troponin I (High Sensitivity) 187 (HH) <18 ng/L    Comment: CRITICAL VALUE NOTED. VALUE IS CONSISTENT WITH PREVIOUSLY REPORTED/CALLED VALUE (NOTE) Elevated high sensitivity troponin I (hsTnI) values and significant  changes across serial measurements may suggest ACS but many other  chronic and acute conditions are known to elevate hsTnI results.  Refer to the "Links" section for chest pain algorithms and additional  guidance. Performed at [facility], [place] [address] [place], [place] [address]   Lactic acid, plasma     Status: None   Collection Time: 07/19/22  9:18 AM  Result Value Ref Range   Lactic Acid, Venous 0.7 0.5 - 1.9 mmol/L    Comment: Performed at [facility] [address], [place], [place] [address]   MR FOOT LEFT WO CONTRAST  Result Date: 07/19/2022 CLINICAL DATA:  Left foot wound EXAM: MRI OF THE LEFT FOOT WITHOUT CONTRAST TECHNIQUE: Multiplanar, multisequence MR imaging of the left forefoot was performed. No intravenous contrast was administered. COMPARISON:  X-ray 07/19/2022, MRI 01/02/2022 FINDINGS: Bones/Joint/Cartilage Prior transmetatarsal amputation of the fifth ray. 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 Displaced ununited fracture of the distal shaft of the fourth metatarsal without residual bone marrow edema. Minimal bone marrow edema of the fourth metatarsal base, nonspecific and may be reactive. Trace fluid in the fourth and fifth tarsometatarsal joints. Remaining osseous structures  are within normal limits. No additional fractures. No dislocation. Ligaments Intact Lisfranc ligament. Remaining collateral ligaments are intact. Muscles and Tendons Denervation changes of the foot musculature.  No tenosynovitis. Soft tissues Wound or ulceration at the lateral foot adjacent to the fifth metatarsal base. Generalized soft tissue swelling and edema. No organized or drainable fluid collections. IMPRESSION: 1. Prior transmetatarsal amputation of the fifth ray. Wound or ulceration at the lateral foot adjacent to the fifth metatarsal base. 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 2. Minimal bone marrow edema of the fourth metatarsal base, nonspecific and may be reactive. Trace fluid in the fourth and fifth tarsometatarsal joints, likely reactive although developing septic arthritis not excluded. 3. Generalized soft tissue edema of the left foot. No organized or drainable fluid collections. 4. Displaced ununited fracture of the distal shaft of the fourth metatarsal without residual bone marrow edema. Electronically Signed   By: Davina Poke D.O.   On: 07/19/2022 10:33   DG Foot Complete Left  Result Date: 07/19/2022 CLINICAL DATA:  Lateral foot wound, concern for osteomyelitis, pain, previous fifth toe amputation April 2023 EXAM: LEFT FOOT - COMPLETE 3+ VIEW COMPARISON:  01/30/2022 FINDINGS: Previous left fifth toe amputation at the level of the fifth metatarsal mid shaft as before. Lateral foot soft tissue wound over the fifth metatarsal base area. There is underlying fifth metatarsal base cortical irregularity and subcortical lucency when compared to the prior study suspicious for osteomyelitis. Healing left fourth metatarsal fracture with callus formation. Peripheral vascular calcifications noted. No joint abnormality. IMPRESSION: 1. Findings suspicious for left fifth metatarsal base osteomyelitis with overlying soft tissue wound. 2.  Healing left fourth metatarsal fracture. 3. Previous left fifth toe amputation. Electronically Signed   By: Jerilynn Mages.  Shick M.D.   On: 07/19/2022 08:47   DG Chest 2 View  Result Date: 07/19/2022 CLINICAL DATA:  42 year old female with history of chest pain. EXAM: CHEST - 2 VIEW COMPARISON:  Chest x-ray 02/08/2022. FINDINGS: Lung volumes are low. No consolidative airspace disease. No pleural effusions. No pneumothorax. No pulmonary nodule or mass noted. Pulmonary vasculature and the cardiomediastinal silhouette are within normal limits. IMPRESSION: 1. Low lung volumes without radiographic evidence of acute cardiopulmonary disease. Electronically Signed   By: Vinnie Langton M.D.   On: 07/19/2022 06:04    Pending Labs Unresulted Labs (From admission, onward)     Start     Ordered   07/20/22 [id number]  Basic metabolic panel  Tomorrow morning,   R        07/19/22 1333   07/20/22 0500  CBC  Tomorrow morning,   R        07/19/22 1333   07/19/22 1324  Sedimentation rate  (COPD / Pneumonia / Cellulitis / Lower Extremity Wound)  Once,   R        07/19/22 1333   07/19/22 1324  C-reactive protein  (COPD / Pneumonia / Cellulitis / Lower Extremity Wound)  Once,   R        07/19/22 1333   07/19/22 1324  Prealbumin  (COPD / Pneumonia / Cellulitis / Lower Extremity Wound)  Once,   R  07/19/22 1333   07/19/22 1324  HIV Antibody (routine testing w rflx)  (HIV Antibody (Routine testing w reflex) panel)  Once,   R        07/19/22 1333   07/19/22 0918  Blood culture (routine x 2)  BLOOD CULTURE X 2,   R (with STAT occurrences)      07/19/22 0917            Vitals/Pain Today's Vitals   07/19/22 0900 07/19/22 1037 07/19/22 1121 07/19/22 1346  BP: 113/77 (!) 130/92    Pulse: 95 98    Resp: 15 20    Temp:  97.6 F (36.4 C)    TempSrc:  Oral    SpO2: 100% 99%    Weight:      Height:      PainSc:   8  6     Isolation Precautions No active isolations  Medications Medications  ceFEPIme (MAXIPIME) 2  g in sodium chloride 0.9 % 100 mL IVPB (0 g Intravenous Stopped 07/19/22 1118)  vancomycin (VANCOCIN) IVPB 1000 mg/200 mL premix (has no administration in time range)  morphine (PF) 4 MG/ML injection 4 mg (has no administration in time range)  ondansetron (ZOFRAN) injection 4 mg (has no administration in time range)  insulin aspart (novoLOG) injection 0-15 Units (has no administration in time range)  insulin aspart (novoLOG) injection 0-5 Units (has no administration in time range)  acetaminophen (TYLENOL) tablet 650 mg (has no administration in time range)    Or  acetaminophen (TYLENOL) suppository 650 mg (has no administration in time range)  docusate sodium (COLACE) capsule 100 mg (has no administration in time range)  polyethylene glycol (MIRALAX / GLYCOLAX) packet 17 g (has no administration in time range)  bisacodyl (DULCOLAX) EC tablet 5 mg (has no administration in time range)  ondansetron (ZOFRAN) tablet 4 mg (has no administration in time range)    Or  ondansetron (ZOFRAN) injection 4 mg (has no administration in time range)  hydrALAZINE (APRESOLINE) injection 5 mg (has no administration in time range)  heparin injection 5,000 Units (has no administration in time range)  oxyCODONE (Oxy IR/ROXICODONE) immediate release tablet 5 mg (has no administration in time range)  vancomycin (VANCOREADY) IVPB 2000 mg/400 mL (2,000 mg Intravenous New Bag/Given 07/19/22 1121)    Mobility walks     Focused Assessments Cardiac Assessment Handoff:  Cardiac Rhythm: Normal sinus rhythm Lab Results  Component Value Date   CKTOTAL 54 08/28/2021   CKMB 0.8 05/14/2008   TROPONINI (H) 05/14/2008    0.18        PERSISTENTLY INCREASED TROPONIN VALUES IN THE RANGE OF 0.06-0.49 ng/mL CAN BE SEEN IN:       -UNSTABLE ANGINA       -CONGESTIVE HEART FAILURE       -MYOCARDITIS       -CHEST TRAUMA       -ARRYHTHMIAS       -LATE PRESENTING MI       -COPD   CLINICAL FOLLOW-UP RECOMMENDED.   Lab  Results  Component Value Date   DDIMER (H) 11/09/2008    0.92        AT THE INHOUSE ESTABLISHED CUTOFF VALUE OF 0.48 ug/mL FEU, THIS ASSAY HAS BEEN DOCUMENTED IN THE LITERATURE TO HAVE A SENSITIVITY AND NEGATIVE PREDICTIVE VALUE OF AT LEAST 98 TO 99%.  THE TEST RESULT SHOULD BE CORRELATED WITH AN ASSESSMENT OF THE CLINICAL PROBABILITY OF DVT / VTE.   Does the Patient currently  have chest pain? No  Pt stated she is having heart burn   R Recommendations: See Admitting Provider Note  Report given to:   Additional Notes:  use a orthboot

## 2022-07-19 NOTE — Consult Note (Signed)
Reason for Consult:Osteomyelitis left foot Referring Physician: Dr. Karmen Bongo, MD  Lauren [name] is an 42 y.o. female.  HPI: 42 year old female admitted to the hospital for concerns of worsening left foot infection.  She is currently on 1-2 antibiotics.  She is scheduled for debridement on April 5 with [name].  She been going to wound care center as well.  She does have a history of MRSA.  He states that over the weekend the wound became tender and red, swollen and she started having nausea and chest pain and she was brought to emergency room and subsequently admitted.  Past Medical History:  Diagnosis Date   Anemia    CAD (coronary artery disease)    a. s/p cath in 03/2014 showing 30% mid-LAD, moderate to severe disease along small D1, patent LCx, moderate to severe distal OM2 stenosis and moderate diffuse diease along RCA not amenable to PCI   CHF (congestive heart failure) ([place])    a. EF 55-60% in 12/2019 b. EF at 35-40% by echo in 05/2020   CKD (chronic kidney disease) stage 4, GFR 15-29 ml/min (HCC)    Diabetes mellitus without complication (HCC)    Myocardial infarction ([place])    Neuropathy    PVD (peripheral vascular disease) ([place])    Stroke ([place]) 01/2022   L hand weakness    Past Surgical History:  Procedure Laterality Date   ABDOMINAL AORTOGRAM W/LOWER EXTREMITY N/A 06/10/2022   Procedure: ABDOMINAL AORTOGRAM W/LOWER EXTREMITY;  Surgeon: Marty Heck, MD;  Location: [facility];  Service: Cardiovascular;  Laterality: N/A;   AMPUTATION Left 09/02/2021   Procedure: AMPUTATION RAY;  Surgeon: Criselda Peaches, DPM;  Location: [facility];  Service: Podiatry;  Laterality: Left;  sagittal saw, 3L bag saline & Pulse   Cardiac catherization     CHOLECYSTECTOMY     I & D EXTREMITY Left 08/30/2021   Procedure: IRRIGATION AND DEBRIDEMENT WITH BONE BIOPSY;  Surgeon: [name], DPM;  Location: [facility];  Service: Podiatry;  Laterality: Left;   IR ANGIO VERTEBRAL SEL  SUBCLAVIAN INNOMINATE UNI L MOD SED  01/18/2022   IR CT HEAD LTD  01/08/2022   IR INTRA CRAN STENT  01/08/2022   IR PERCUTANEOUS ART THROMBECTOMY/INFUSION INTRACRANIAL INC DIAG ANGIO  01/08/2022   IRRIGATION AND DEBRIDEMENT FOOT Left 09/04/2021   Procedure: IRRIGATION AND DEBRIDEMENT FOOT AND CLOSURE;  Surgeon: Criselda Peaches, DPM;  Location: [facility];  Service: Podiatry;  Laterality: Left;   PERIPHERAL VASCULAR BALLOON ANGIOPLASTY Left 06/10/2022   Procedure: PERIPHERAL VASCULAR BALLOON ANGIOPLASTY;  Surgeon: Marty Heck, MD;  Location: [facility];  Service: Cardiovascular;  Laterality: Left;  AT and DP   PERIPHERAL VASCULAR INTERVENTION Left 06/10/2022   Procedure: PERIPHERAL VASCULAR INTERVENTION;  Surgeon: Marty Heck, MD;  Location: [facility];  Service: Cardiovascular;  Laterality: Left;  SFA   RADIOLOGY WITH ANESTHESIA N/A 01/08/2022   Procedure: IR WITH ANESTHESIA;  Surgeon: Radiologist, Medication, MD;  Location: [facility];  Service: Radiology;  Laterality: N/A;   THROMBECTOMY BRACHIAL ARTERY Right 11/18/2021   Procedure: RIGHT BRACHIAL, RADIAL, & ULNAR ARTERY THROMBECTOMY.;  Surgeon: Marty Heck, MD;  Location[facility];  Service: Vascular;  Laterality: Right;   TUBAL LIGATION      Family History  Problem Relation Age of Onset   Thyroid disease Mother    Hypertension Mother    Hyperlipidemia Mother    CAD Mother    CVA Mother  Hyperlipidemia Father    Hypertension Father    CAD Father    Breast cancer Paternal Grandmother 24   Cancer Paternal Grandmother        lung    Social History:  reports that she quit smoking about 6 months ago. Her smoking use included cigarettes. She has a 15.00 pack-year smoking history. She has been exposed to tobacco smoke. She has never used smokeless tobacco. She reports current alcohol use. She reports that she does not use drugs.  Allergies:  Allergies  Allergen Reactions   Visipaque [Iodixanol] Nausea And Vomiting     Patient had vagal response during procedure became hypertensive, and bradycardic. CO2 used during procedure prior to contrast being used, this may be cause as well. Recommended premedicating prior to contrast being used in the future.    Wellbutrin [Bupropion] Hives   Cefepime Rash    Tolerates penicilllin   Ciprofloxacin Hcl Hives and Rash    Hives/rash at injection site; 01/15/22 tolerated IV cipro   Tape Rash    Medications: I have reviewed the patient's current medications.  Results for orders placed or performed during the hospital encounter of 07/19/22 (from the past 48 hour(s))  Basic metabolic panel     Status: Abnormal   Collection Time: 07/19/22  5:33 AM  Result Value Ref Range   Sodium 134 (L) 135 - 145 mmol/L   Potassium 3.8 3.5 - 5.1 mmol/L   Chloride 102 98 - 111 mmol/L   CO2 22 22 - 32 mmol/L   Glucose, Bld 114 (H) 70 - 99 mg/dL    Comment: Glucose reference range applies only to samples taken after fasting for at least 8 hours.   BUN 35 (H) 6 - 20 mg/dL   Creatinine, Ser 2.68 (H) 0.44 - 1.00 mg/dL   Calcium 8.1 (L) 8.9 - 10.3 mg/dL   GFR, Estimated 22 (L) >60 mL/min    Comment: (NOTE) Calculated using the CKD-EPI Creatinine Equation (2021)    Anion gap 10 5 - 15    Comment: Performed at [facility] [address] [place], [place] [address]  CBC     Status: Abnormal   Collection Time: 07/19/22  5:33 AM  Result Value Ref Range   WBC 13.6 (H) 4.0 - 10.5 K/uL   RBC 3.13 (L) 3.87 - 5.11 MIL/uL   Hemoglobin 9.1 (L) 12.0 - 15.0 g/dL   HCT 28.0 (L) 36.0 - 46.0 %   MCV 89.5 80.0 - 100.0 fL   MCH 29.1 26.0 - 34.0 pg   MCHC 32.5 30.0 - 36.0 g/dL   RDW 13.8 11.5 - 15.5 %   Platelets 506 (H) 150 - 400 K/uL   nRBC 0.0 0.0 - 0.2 %    Comment: Performed at [facility], [place] [address], [place], [place] [address]  Troponin I (High Sensitivity)     Status: Abnormal   Collection Time: 07/19/22  5:33 AM  Result Value Ref Range   Troponin I (High Sensitivity)  198 (HH) <18 ng/L    Comment: CRITICAL RESULT CALLED TO, READ BACK BY AND VERIFIED WITH [place] PARAMEDIC 07/19/22 [id number] Jerilynn Mages KOROLESKI (NOTE) Elevated high sensitivity troponin I (hsTnI) values and significant  changes across serial measurements may suggest ACS but many other  chronic and acute conditions are known to elevate hsTnI results.  Refer to the "Links" section for chest pain algorithms and additional  guidance. Performed at [facility], [place],  [place] [address]   I-Stat beta hCG blood, ED     Status: None   Collection Time: 07/19/22  6:22 AM  Result Value Ref Range   I-stat hCG, quantitative <5.0 <5 mIU/mL   Comment 3            Comment:   GEST. AGE      CONC.  (mIU/mL)   <=1 WEEK        5 - 50     2 WEEKS       50 - 500     3 WEEKS       100 - 10,000     4 WEEKS     1,000 - 30,000        FEMALE AND NON-PREGNANT FEMALE:     LESS THAN 5 mIU/mL   Protime-INR     Status: Abnormal   Collection Time: 07/19/22  6:35 AM  Result Value Ref Range   Prothrombin Time 27.8 (H) 11.4 - 15.2 seconds   INR 2.6 (H) 0.8 - 1.2    Comment: (NOTE) INR goal varies based on device and disease states. Performed at [facility], [place] [address] [place], [place] [address]   Troponin I (High Sensitivity)     Status: Abnormal   Collection Time: 07/19/22  6:45 AM  Result Value Ref Range   Troponin I (High Sensitivity) 187 (HH) <18 ng/L    Comment: CRITICAL VALUE NOTED. VALUE IS CONSISTENT WITH PREVIOUSLY REPORTED/CALLED VALUE (NOTE) Elevated high sensitivity troponin I (hsTnI) values and significant  changes across serial measurements may suggest ACS but many other  chronic and acute conditions are known to elevate hsTnI results.  Refer to the "Links" section for chest pain algorithms and additional  guidance. Performed at [facility], [place] [address] [place], [place] [address]   Lactic acid, plasma     Status: None   Collection Time: 07/19/22  9:18  AM  Result Value Ref Range   Lactic Acid, Venous 0.7 0.5 - 1.9 mmol/L    Comment: Performed at [facility] [address], [place], [place] [address]  Sedimentation rate     Status: Abnormal   Collection Time: 07/19/22  2:51 PM  Result Value Ref Range   Sed Rate 135 (H) 0 - 22 mm/hr    Comment: Performed at [facility], [place] [address] [place], [place] [address]  C-reactive protein     Status: Abnormal   Collection Time: 07/19/22  2:51 PM  Result Value Ref Range   CRP 6.1 (H) <1.0 mg/dL    Comment: Performed at [facility], [place] [address], [place], [place] [address]  Prealbumin     Status: Abnormal   Collection Time: 07/19/22  2:51 PM  Result Value Ref Range   Prealbumin 16 (L) 18 - 38 mg/dL    Comment: Performed at [facility], [place] [address], [place], [place] [address]  HIV Antibody (routine testing w rflx)     Status: None   Collection Time: 07/19/22  2:51 PM  Result Value Ref Range   HIV Screen 4th Generation wRfx Non Reactive Non Reactive    Comment: Performed at [facility], [place] [address], [place], [place] [address]  Glucose, capillary     Status: Abnormal   Collection Time: 07/19/22  3:19 PM  Result Value Ref Range   Glucose-Capillary 268 (H) 70 - 99 mg/dL    Comment: Glucose reference  range applies only to samples taken after fasting for at least 8 hours.  Glucose, capillary     Status: Abnormal   Collection Time: 07/19/22  5:14 PM  Result Value Ref Range   Glucose-Capillary 255 (H) 70 - 99 mg/dL    Comment: Glucose reference range applies only to samples taken after fasting for at least 8 hours.    MR FOOT LEFT WO CONTRAST  Result Date: 07/19/2022 CLINICAL DATA:  Left foot wound EXAM: MRI OF THE LEFT FOOT WITHOUT CONTRAST TECHNIQUE: Multiplanar, multisequence MR imaging of the left forefoot was performed. No intravenous contrast was administered. COMPARISON:  X-ray 07/19/2022, MRI 01/02/2022 FINDINGS: Bones/Joint/Cartilage Prior  transmetatarsal amputation of the fifth ray. 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 Displaced ununited fracture of the distal shaft of the fourth metatarsal without residual bone marrow edema. Minimal bone marrow edema of the fourth metatarsal base, nonspecific and may be reactive. Trace fluid in the fourth and fifth tarsometatarsal joints. Remaining osseous structures are within normal limits. No additional fractures. No dislocation. Ligaments Intact Lisfranc ligament. Remaining collateral ligaments are intact. Muscles and Tendons Denervation changes of the foot musculature.  No tenosynovitis. Soft tissues Wound or ulceration at the lateral foot adjacent to the fifth metatarsal base. Generalized soft tissue swelling and edema. No organized or drainable fluid collections. IMPRESSION: 1. Prior transmetatarsal amputation of the fifth ray. Wound or ulceration at the lateral foot adjacent to the fifth metatarsal base. 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 2. Minimal bone marrow edema of the fourth metatarsal base, nonspecific and may be reactive. Trace fluid in the fourth and fifth tarsometatarsal joints, likely reactive although developing septic arthritis not excluded. 3. Generalized soft tissue edema of the left foot. No organized or drainable fluid collections. 4. Displaced ununited fracture of the distal shaft of the fourth metatarsal without residual bone marrow edema. Electronically Signed   By: Davina Poke D.O.   On: 07/19/2022 10:33   DG Foot Complete Left  Result Date: 07/19/2022 CLINICAL DATA:  Lateral foot wound, concern for osteomyelitis, pain, previous fifth toe amputation April 2023 EXAM: LEFT FOOT - COMPLETE 3+ VIEW COMPARISON:  01/30/2022 FINDINGS: Previous left fifth toe amputation at the level  of the fifth metatarsal mid shaft as before. Lateral foot soft tissue wound over the fifth metatarsal base area. There is underlying fifth metatarsal base cortical irregularity and subcortical lucency when compared to the prior study suspicious for osteomyelitis. Healing left fourth metatarsal fracture with callus formation. Peripheral vascular calcifications noted. No joint abnormality. IMPRESSION: 1. Findings suspicious for left fifth metatarsal base osteomyelitis with overlying soft tissue wound. 2. Healing left fourth metatarsal fracture. 3. Previous left fifth toe amputation. Electronically Signed   By: Jerilynn Mages.  Shick M.D.   On: 07/19/2022 08:47   DG Chest 2 View  Result Date: 07/19/2022 CLINICAL DATA:  42 year old female with history of chest pain. EXAM: CHEST - 2 VIEW COMPARISON:  Chest x-ray 02/08/2022. FINDINGS: Lung volumes are low. No consolidative airspace disease. No pleural effusions. No pneumothorax. No pulmonary nodule or mass noted. Pulmonary vasculature and the cardiomediastinal silhouette are within normal limits. IMPRESSION: 1. Low lung volumes without radiographic evidence of acute cardiopulmonary disease. Electronically Signed   By: Vinnie Langton M.D.   On: 07/19/2022 06:04    Review of Systems Blood pressure 138/83, pulse 99,  temperature 97.8 F (36.6 C), temperature source Oral, resp. rate 18, height 5\' 5"  (1.651 m), weight 88.5 kg, last menstrual period 07/18/2022, SpO2 100 %. Physical Exam General: AAO x3, NAD  Dermatological: Full-thickness ulceration noted fifth metatarsal base on the left foot there is mild purulence coming from the ulcer. There is malodor present.  There is edema and erythema present.  There is no external rotation.  Vascular: Foot is warm appears to be perfused.  Neruologic: Sensation decreased  Musculoskeletal: Previous partial fifth ray potation  Assessment/Plan: Left foot osteomyelitis, cellulitis  -I reviewed the MRI with the patient and her  daughter at bedside and husband over the phone.  We discussed the conservative as well as surgical options.  Given the acute osteomyelitis recommend surgical intervention.  Will plan for left fifth metatarsal excision, debridement ulceration with possible bone biopsy of the fourth metatarsal.  Unfortunate this will detach the peroneus brevis tendon and if this does not appear to be infected we will tag for later transfer.  We discussed that if not able to see the tendon at risk for foot deformity.  After discussion they all agreed to proceed with surgery tomorrow with the above procedures.  Alternatives, risks, complications were discussed.  No promises or guarantees given.  We discussed risks of surgery including spread of infection, further amputation including loss of leg, bleeding, scarring as well as general risk of surgery.  They are in agreement. -N.p.o. after midnight -Appreciate vascular surgery input -Continue broad-spectrum antibiotics -Podiatry will continue to follow  Trula Slade 07/19/2022, 5:47 PM

## 2022-07-19 NOTE — ED Triage Notes (Signed)
Pt arrived to triage complaining of N/V and chest pain that started last night around 10PM  Pt states that she has CHF and states she has noticed some swelling lately.   Pt also has a known foot wound that she is on antibiotics for but is supposed to be a second.  States she was told to come to ED if she started to feel ill or have fevers

## 2022-07-19 NOTE — H&P (Signed)
History and Physical    Patient: Lauren [name] [id number] DOB: 08/16/1980 DOA: 07/19/2022 DOS: the patient was seen and examined on 07/19/2022 PCP: Johnette Abraham, MD  Patient coming from: Home - lives with husband, son, daughter; NOK: Earnie Larsson [id number]    Chief Complaint: foot infection  HPI: Lauren [name] is a 42 y.o. female with medical history significant of CAD, stage 4 CKD, chronic systolic CHF, DM, CVA, and obesity presenting with chest pain, n/v.  She has a foot wound and is on one of two antibiotics; she is scheduled for debridement on 4/5 with podiatry.   She reports that she was vomiting with chills and had chest pain.  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  She was recently given an antibiotic - she has been using the cream but never received the oral form.  The wound has been tender, red, swollen for a few days.  No fevers.  No further chest pain, resolved spontaneously.    ER Course:   On AC.  CP + foot wound, has osteo.  Has been seeing podiatry for several months + wound care.  She was given an abx ointment and pill but unable to afford abx.  Having periodic debridement.  Fever, chills last night.  Chest pain this AM.  EKG unchanged.  Hgb 11 -> 9.  Creatinine at baseline.  Xray with osteo.  MRI also +.  Podiatry will consult.  Given Cefepime/Vanc.  Blood cultures pending.  Chest pain resolved.     Review of Systems: As mentioned in the history of present illness. All other systems reviewed and are negative. Past Medical History:  Diagnosis Date   Anemia    CAD (coronary artery disease)    a. s/p cath in 03/2014 showing 30% mid-LAD, moderate to severe disease along small D1, patent LCx, moderate to severe distal OM2 stenosis and moderate diffuse diease along RCA not amenable to PCI   CHF (congestive heart failure) ([place])    a. EF 55-60% in 12/2019 b. EF at 35-40% by echo in 05/2020   CKD (chronic kidney  disease) stage 4, GFR 15-29 ml/min (HCC)    Diabetes mellitus without complication (HCC)    Myocardial infarction ([place])    Neuropathy    PVD (peripheral vascular disease) ([place])    Stroke ([place]) 01/2022   L hand weakness   Past Surgical History:  Procedure Laterality Date   ABDOMINAL AORTOGRAM W/LOWER EXTREMITY N/A 06/10/2022   Procedure: ABDOMINAL AORTOGRAM W/LOWER EXTREMITY;  Surgeon: Marty Heck, MD;  Location: [facility];  Service: Cardiovascular;  Laterality: N/A;   AMPUTATION Left 09/02/2021   Procedure: AMPUTATION RAY;  Surgeon: Criselda Peaches, DPM;  Location: [facility];  Service: Podiatry;  Laterality: Left;  sagittal saw, 3L bag saline & Pulse   Cardiac catherization     CHOLECYSTECTOMY     I & D EXTREMITY Left 08/30/2021   Procedure: IRRIGATION AND DEBRIDEMENT WITH BONE BIOPSY;  Surgeon: [name], DPM;  Location: [facility];  Service: Podiatry;  Laterality: Left;   IR ANGIO VERTEBRAL SEL SUBCLAVIAN INNOMINATE UNI L MOD SED  01/18/2022   IR CT HEAD LTD  01/08/2022   IR INTRA CRAN STENT  01/08/2022   IR PERCUTANEOUS ART THROMBECTOMY/INFUSION INTRACRANIAL INC DIAG ANGIO  01/08/2022   IRRIGATION AND DEBRIDEMENT FOOT Left 09/04/2021   Procedure: IRRIGATION AND DEBRIDEMENT FOOT AND CLOSURE;  Surgeon: Lanae Crumbly  R, DPM;  Location: [facility];  Service: Podiatry;  Laterality: Left;   PERIPHERAL VASCULAR BALLOON ANGIOPLASTY Left 06/10/2022   Procedure: PERIPHERAL VASCULAR BALLOON ANGIOPLASTY;  Surgeon: Marty Heck, MD;  Location: [facility];  Service: Cardiovascular;  Laterality: Left;  AT and DP   PERIPHERAL VASCULAR INTERVENTION Left 06/10/2022   Procedure: PERIPHERAL VASCULAR INTERVENTION;  Surgeon: Marty Heck, MD;  Location: [facility];  Service: Cardiovascular;  Laterality: Left;  SFA   RADIOLOGY WITH ANESTHESIA N/A 01/08/2022   Procedure: IR WITH ANESTHESIA;  Surgeon: Radiologist, Medication, MD;  Location: [facility];  Service: Radiology;  Laterality: N/A;    THROMBECTOMY BRACHIAL ARTERY Right 11/18/2021   Procedure: RIGHT BRACHIAL, RADIAL, & ULNAR ARTERY THROMBECTOMY.;  Surgeon: Marty Heck, MD;  Location: [facility];  Service: Vascular;  Laterality: Right;   TUBAL LIGATION     Social History:  reports that she quit smoking about 6 months ago. Her smoking use included cigarettes. She has a 15.00 pack-year smoking history. She has been exposed to tobacco smoke. She has never used smokeless tobacco. She reports current alcohol use. She reports that she does not use drugs.  Allergies  Allergen Reactions   Visipaque [Iodixanol] Nausea And Vomiting    Patient had vagal response during procedure became hypertensive, and bradycardic. CO2 used during procedure prior to contrast being used, this may be cause as well. Recommended premedicating prior to contrast being used in the future.    Wellbutrin [Bupropion] Hives   Cefepime Rash    Tolerates penicilllin   Ciprofloxacin Hcl Hives and Rash    Hives/rash at injection site; 01/15/22 tolerated IV cipro   Tape Rash    Family History  Problem Relation Age of Onset   Thyroid disease Mother    Hypertension Mother    Hyperlipidemia Mother    CAD Mother    CVA Mother    Hyperlipidemia Father    Hypertension Father    CAD Father    Breast cancer Paternal Grandmother 38   Cancer Paternal Grandmother        lung    Prior to Admission medications   Medication Sig Start Date End Date Taking? Authorizing Provider  acetaminophen (TYLENOL) 325 MG tablet Take 1-2 tablets (325-650 mg total) by mouth every 4 (four) hours as needed for mild pain. Patient taking differently: Take 650 mg by mouth as needed for mild pain. 02/18/22   Love, Ivan Anchors, PA-C  amoxicillin-clavulanate (AUGMENTIN) 875-125 MG tablet Take 1 tablet by mouth 2 (two) times daily. 05/25/22   McDonald, Stephan Minister, DPM  atorvastatin (LIPITOR) 40 MG tablet Take 1 tablet (40 mg total) by mouth daily. Courtesy fill/ pt to get established with a  primary MD for further refills 06/01/22 11/28/22  Johnette Abraham, MD  clopidogrel (PLAVIX) 75 MG tablet Take 1 tablet (75 mg total) by mouth daily. 04/29/22   Garvin Fila, MD  Continuous Blood Gluc Sensor (DEXCOM G6 SENSOR) MISC Change sensor every 10 days as directed 09/29/21   Brita Romp, NP  diclofenac Sodium (VOLTAREN ARTHRITIS PAIN) 1 % GEL Apply 2 g topically 4 (four) times daily. Patient taking differently: Apply 1 Application topically 2 (two) times daily as needed (pain). 04/19/22   Jennye Boroughs, MD  enoxaparin (LOVENOX) 150 MG/ML injection Inject 1 mL (150 mg total) into the skin daily for 10 days. 06/06/22 06/16/22  Arnoldo Lenis, MD  furosemide (LASIX) 40 MG tablet Take 40 mg daily as needed  for swelling 03/05/22   Arnoldo Lenis, MD  hydrALAZINE (APRESOLINE) 25 MG tablet Take 0.5 tablets (12.5 mg total) by mouth 2 (two) times daily. 05/10/22   Arnoldo Lenis, MD  ibuprofen (ADVIL) 200 MG tablet Take 400 mg by mouth as needed for moderate pain or mild pain.    [provider]  insulin aspart (NOVOLOG) 100 UNIT/ML injection Use with Omnipod for daily dose around 80 units daily 06/17/22   Brita Romp, NP  Insulin Disposable Pump (OMNIPOD 5 G6 PODS, GEN 5,) MISC Change pod every 48-72 hours 06/17/22   Brita Romp, NP  Insulin Disposable Pump (OMNIPOD 5 G6 PODS, GEN 5,) MISC Change pod every 48-72 hours 07/07/22   Brita Romp, NP  isosorbide mononitrate (ISMO) 20 MG tablet Take 1 tablet (20 mg total) by mouth 2 (two) times daily. Courtesy fill/ pt to get established with a primary MD for further refills 06/01/22 11/28/22  Johnette Abraham, MD  levothyroxine (SYNTHROID) 125 MCG tablet Take 1 tablet (125 mcg total) by mouth daily at 6 (six) AM. 06/17/22 06/12/23  Brita Romp, NP  magnesium oxide (MAG-OX) 400 MG tablet Take 0.5 tablets (200 mg total) by mouth at bedtime. Patient taking differently: Take 400 mg by mouth daily. 03/12/22   Benay Pike, MD  methocarbamol (ROBAXIN) 500 MG tablet Take 1 tablet (500 mg total) by mouth every 6 (six) hours as needed for muscle spasms. Patient taking differently: Take 500 mg by mouth as needed for muscle spasms. 04/05/22   Johnette Abraham, MD  metoprolol succinate (TOPROL XL) 50 MG 24 hr tablet Take 1 tablet (50 mg total) by mouth daily. 05/10/22   Arnoldo Lenis, MD  metoprolol succinate (TOPROL-XL) 25 MG 24 hr tablet Take 25 mg by mouth in the morning and at bedtime.    [provider]  Multiple Vitamin (MULTIVITAMIN WITH MINERALS) TABS tablet Take 1 tablet by mouth daily. 02/18/22   Love, Ivan Anchors, PA-C  nitroGLYCERIN (NITROSTAT) 0.4 MG SL tablet Place 1 tablet (0.4 mg total) under the tongue every 5 (five) minutes x 3 doses as needed for chest pain. 02/18/22   Love, Ivan Anchors, PA-C  ondansetron (ZOFRAN) 4 MG tablet Take 1 tablet (4 mg total) by mouth every 8 (eight) hours as needed for nausea or vomiting. 03/01/22   Lovorn, Jinny Blossom, MD  oxyCODONE (OXY IR/ROXICODONE) 5 MG immediate release tablet Take 1 tablet (5 mg total) by mouth every 6 (six) hours as needed for severe pain. 07/08/22   McDonald, Stephan Minister, DPM  pantoprazole (PROTONIX) 40 MG tablet Take 1 tablet (40 mg total) by mouth daily. Courtesy fill/ pt to get established with a primary MD for further refills Patient taking differently: Take 40 mg by mouth daily as needed (reflux). 06/01/22 11/28/22  Johnette Abraham, MD  polyethylene glycol (MIRALAX / GLYCOLAX) 17 g packet Take 17 g by mouth 2 (two) times daily. Patient taking differently: Take 17 g by mouth as needed for moderate constipation. 03/03/22   Bayard Hugger, NP  ramelteon (ROZEREM) 8 MG tablet Take 1 tablet (8 mg total) by mouth at bedtime. 02/19/22   Love, Ivan Anchors, PA-C  ranolazine (RANEXA) 500 MG 12 hr tablet Take 1 tablet (500 mg total) by mouth 2 (two) times daily. Courtesy fill/ pt to get established with a primary MD for further refills 06/01/22 08/30/22  Johnette Abraham, MD  SANTYL 250 UNIT/GM ointment Apply 1 Application topically daily.  05/31/22   [provider]  tiZANidine (ZANAFLEX) 4 MG tablet Take 1 tablet (4 mg total) by mouth at bedtime. Courtesy fill/ pt to get established with a primary MD for further refills 06/01/22   Johnette Abraham, MD  traZODone (DESYREL) 50 MG tablet Take 1 tablet (50 mg total) by mouth at bedtime. 07/09/22   Jennye Boroughs, MD  Vitamin D, Ergocalciferol, (DRISDOL) 1.25 MG (50000 UNIT) CAPS capsule Take 1 capsule (50,000 Units total) by mouth every 7 (seven) days for 8 doses. 06/02/22 07/22/22  Johnette Abraham, MD  warfarin (COUMADIN) 5 MG tablet TAKE ONE TABLET BY MOUTH DAILY EXCEPT 1 AND 1/2 TABLET ON [facility] AND SATURDAYS OR AS DIRECTED 06/15/22   Arnoldo Lenis, MD    Physical Exam: Vitals:   07/19/22 0900 07/19/22 1037 07/19/22 1422 07/19/22 1505  BP: 113/77 (!) 130/92  138/83  Pulse: 95 98  99  Resp: 15 20  18   Temp:  97.6 F (36.4 C) 97.6 F (36.4 C) 97.8 F (36.6 C)  TempSrc:  Oral Oral Oral  SpO2: 100% 99%  100%  Weight:      Height:       General:  Appears calm and comfortable and is in NAD Eyes:   EOMI, normal lids, iris ENT:  grossly normal hearing, lips & tongue, mmm Neck:  no LAD, masses or thyromegaly Cardiovascular:  RRR, no m/r/g. No LE edema.  Respiratory:   CTA bilaterally with no wheezes/rales/rhonchi.  Normal respiratory effort. Abdomen:  soft, NT, ND Skin:  Prior 5th ray amputation, now here with ulcer at base of 5th metatarsal with purulent drainage, surrounding erythema/edema      Musculoskeletal:  LUE weakness, no bony abnormality other than as above Psychiatric: blunted mood and affect, speech fluent and appropriate, AOx3 Neurologic:  CN 2-12 grossly intact, moves all extremities in coordinated fashion with L hemiparesis (upper > lower)   Radiological Exams on Admission: Independently reviewed - see discussion in A/P where applicable  MR FOOT LEFT WO  CONTRAST  Result Date: 07/19/2022 CLINICAL DATA:  Left foot wound EXAM: MRI OF THE LEFT FOOT WITHOUT CONTRAST TECHNIQUE: Multiplanar, multisequence MR imaging of the left forefoot was performed. No intravenous contrast was administered. COMPARISON:  X-ray 07/19/2022, MRI 01/02/2022 FINDINGS: Bones/Joint/Cartilage Prior transmetatarsal amputation of the fifth ray. 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 Displaced ununited fracture of the distal shaft of the fourth metatarsal without residual bone marrow edema. Minimal bone marrow edema of the fourth metatarsal base, nonspecific and may be reactive. Trace fluid in the fourth and fifth tarsometatarsal joints. Remaining osseous structures are within normal limits. No additional fractures. No dislocation. Ligaments Intact Lisfranc ligament. Remaining collateral ligaments are intact. Muscles and Tendons Denervation changes of the foot musculature.  No tenosynovitis. Soft tissues Wound or ulceration at the lateral foot adjacent to the fifth metatarsal base. Generalized soft tissue swelling and edema. No organized or drainable fluid collections. IMPRESSION: 1. Prior transmetatarsal amputation of the fifth ray. Wound or ulceration at the lateral foot adjacent to the fifth metatarsal base. 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 2. Minimal bone marrow edema of the fourth metatarsal base, nonspecific and may be reactive. Trace fluid in the fourth and fifth tarsometatarsal joints, likely reactive although developing septic arthritis not excluded. 3. Generalized soft tissue edema of the  left foot. No organized or drainable fluid collections. 4. Displaced ununited fracture of the distal shaft of the fourth metatarsal without residual bone marrow edema. Electronically Signed   By:  Davina Poke D.O.   On: 07/19/2022 10:33   DG Foot Complete Left  Result Date: 07/19/2022 CLINICAL DATA:  Lateral foot wound, concern for osteomyelitis, pain, previous fifth toe amputation April 2023 EXAM: LEFT FOOT - COMPLETE 3+ VIEW COMPARISON:  01/30/2022 FINDINGS: Previous left fifth toe amputation at the level of the fifth metatarsal mid shaft as before. Lateral foot soft tissue wound over the fifth metatarsal base area. There is underlying fifth metatarsal base cortical irregularity and subcortical lucency when compared to the prior study suspicious for osteomyelitis. Healing left fourth metatarsal fracture with callus formation. Peripheral vascular calcifications noted. No joint abnormality. IMPRESSION: 1. Findings suspicious for left fifth metatarsal base osteomyelitis with overlying soft tissue wound. 2. Healing left fourth metatarsal fracture. 3. Previous left fifth toe amputation. Electronically Signed   By: Jerilynn Mages.  Shick M.D.   On: 07/19/2022 08:47   DG Chest 2 View  Result Date: 07/19/2022 CLINICAL DATA:  42 year old female with history of chest pain. EXAM: CHEST - 2 VIEW COMPARISON:  Chest x-ray 02/08/2022. FINDINGS: Lung volumes are low. No consolidative airspace disease. No pleural effusions. No pneumothorax. No pulmonary nodule or mass noted. Pulmonary vasculature and the cardiomediastinal silhouette are within normal limits. IMPRESSION: 1. Low lung volumes without radiographic evidence of acute cardiopulmonary disease. Electronically Signed   By: Vinnie Langton M.D.   On: 07/19/2022 06:04    EKG: Independently reviewed.  NSR with rate 92; nonspecific ST changes with no evidence of acute ischemia   Labs on Admission: I have personally reviewed the available labs and imaging studies at the time of the admission.  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 Service: Hospitalist  L foot osteo -Patient with prior 5th ray amputation -She developed an ulcer along the base of the 5th metatarsal -She has been followed by podiatry -Foot ulcer is present and draining and now the patient has developed surrounding cellulitis -Foot MRI with osteo at 5th metatarsal base -Plan for metatarsectomy tomorrow with 4th metatarsal bone biopsy -Negative lactate, no current concerns for sepsis -Will treat with IV antibiotics (Rocephin/Flagyl/Vanc as per the lower extremity wound algorithm) -Recent angioplasty and stenting by Dr. Carlis Abbott; will consult vascular to ensure no further intervention is needed from their standpoint -Patient is NPO after midnight for procedure tomorrow -LE wound order set utilized including labs (CRP, ESR, A1c, prealbumin, HIV, and blood cultures) and consults (diabetes coordinator; peripheral vascular navigator; TOC team; wound care; and nutrition)   PVD -Angioplasty with stent placement on 2/8 -Vascular surgery consulted -Continue Plavix  H/o CVA -L hemiparesis, upper > lower  DM -Poor glycemic control, A1c 8.0 on 9/9; will recheck -She has started on a pump but does not have supplies in the hospital and so will transition off for now -Restart glargine and cover with sensitive-scale SSI for now -Diabetes coordinator was consulted   Chronic systolic  CHF/CAD -Appears compensated - no c/o CP/SOB -Continue Plavix, Ranexa, Imdur -Hold Lasix  HLD -Continue Lipitor   HTN -Continue hydralazine, Toprol XL    Hypothyroidism -Continue Synthroid -Needs outpatient f/u for testing   Tobacco dependence -Not currently smoking, praise provided   Stage 4CKD -Appears to be stable at this time -Attempt to avoid nephrotoxic medications -Recheck BMP in AM   Chronic pain -I have reviewed this patient in the [place] Controlled Substances Reporting System.  She is receiving medications from only one provider and appears to be taking them as prescribed. -She is not at particularly high risk of opioid misuse, diversion, or overdose.   Obesity -Body mass index is 32.45 kg/m..  -Weight loss should be encouraged -Outpatient PCP/bariatric medicine f/u encouraged   DNR -MOST form from 01/2022 indicates that patient is DNR -I have discussed code status with the patient and her granddaughter and  they are in agreement that the patient would not desire resuscitation and would prefer to die a natural death should that situation arise.     Advance Care Planning:   Code Status: DNR   Consults: Podiatry; vascular surgery; diabetes coordinator; peripheral vascular navigator; TOC team; wound care; and nutrition  DVT Prophylaxis: Heparin SQ  Family Communication: None present; she is capable of communicating with her husband at this time  Severity of Illness: The appropriate patient status for this patient is INPATIENT. Inpatient status is judged to be reasonable and necessary in order to provide the required intensity of service to ensure the patient's safety. The patient's presenting symptoms, physical exam findings, and initial radiographic and laboratory data in the context of their chronic comorbidities is felt to place them at high risk for further clinical deterioration. Furthermore, it is not anticipated that the patient will be medically stable for  discharge from the hospital within 2 midnights of admission.   * I certify that at the point of admission it is my clinical judgment that the patient will require inpatient hospital care spanning beyond 2 midnights from the point of admission due to high intensity of service, high risk for further deterioration and high frequency of surveillance required.*  Author: Karmen Bongo, MD 07/19/2022 6:27 PM  For on call review [email or web address].

## 2022-07-19 NOTE — Progress Notes (Addendum)
Inpatient Diabetes Program Recommendations  AACE/ADA: New Consensus Statement on Inpatient Glycemic Control (2015)  Target Ranges:  Prepandial:   less than 140 mg/dL      Peak postprandial:   less than 180 mg/dL (1-2 hours)      Critically ill patients:  140 - 180 mg/dL   Lab Results  Component Value Date   GLUCAP 191 (H) 06/10/2022   HGBA1C 8.0 (H) 01/09/2022    Review of Glycemic Control:   Latest Reference Range & Units 07/19/22 05:33  Glucose 70 - 99 mg/dL 114 (H)   Diabetes history: Type 1 DM since age 42 Outpatient Diabetes medications:  Omnipod- Goal blood sugar 120 mg/dL , Carbohydrate ratio 1 unit/8 grams of CHO, Active insulin time: 3 hours Per Whitney with [facility]-last settings on 01/10/22 Basal rate 12a-12p = 0.9 units/hr--total 24 hr basal 21.6 units  Current orders for Inpatient glycemic control:  Novolog moderate tid with meals and HS  Inpatient Diabetes Program Recommendations:    Note patient has history of Type 1 DM and wears insulin pump- Per patients significant other insulin pump was removed this morning for MRI at 10 am.  She currently has not supplies with her.  Needs stat CBG checked and likely will need basal/bolus insulin started ASAP due to history of Type 1 DM.   -Consider adding Semglee 25 units daily and Novolog 6 units tid with meals (hold if patient eats less than 50%).  Also recommend reducing Novolog correction to sensitive tid with meals and HS. Patient should not resume insulin pump until 24 hours after [facility] given.  Called and spoke with husband who's at bedside and let him know the plan.     Thanks,  Adah Perl, RN, BC-ADM Inpatient Diabetes Coordinator Pager [phone]  (8a-5p)

## 2022-07-19 NOTE — Consult Note (Addendum)
Hospital Consult    Reason for Consult:  left foot osteomyelitis Requesting Physician:  Karmen Bongo MD MRN #:  [id number]  History of Present Illness: Lauren [name] is a 42 y.o. female with a PMH of CAD, CHF, DM2, CVA, neuropathy, MI, and PAD who presented to the hospital today with nausea, vomiting, and chest pain.  She states last night that she felt nauseous and vomited 1 time.  This resolved after taking a Zofran and she went to bed fine.  She woke up this morning with chest pain, not associated with shortness of breath.  She states that she was told by her podiatrist to go to the emergency room if she felt ill.  She has a history of diabetic L foot ulcer and was last seen by [name] 2 weeks ago.  She states that she was going to be prescribed an antibiotic cream and pill to take, but "did not get notice from the pharmacy that it was prescribed".  She endorses some purulent, yellow drainage from her foot wound.  She denies any pain in the foot.  She denies any known fevers, abdominal pain, diarrhea.  We were consulted to ensure that the patient is vascularly optimized.  On 06/10/2022 she underwent left anterior tibial and dorsalis pedis angioplasty with angioplasty and stenting of the mid left SFA by Dr. Carlis Abbott.  She denies any claudication or rest pain. She has been taking her Plavix and Coumadin  Past Medical History:  Diagnosis Date   Anemia    CAD (coronary artery disease)    a. s/p cath in 03/2014 showing 30% mid-LAD, moderate to severe disease along small D1, patent LCx, moderate to severe distal OM2 stenosis and moderate diffuse diease along RCA not amenable to PCI   CHF (congestive heart failure) ([place])    a. EF 55-60% in 12/2019 b. EF at 35-40% by echo in 05/2020   CKD (chronic kidney disease) stage 4, GFR 15-29 ml/min (HCC)    Diabetes mellitus without complication (HCC)    Myocardial infarction ([place])    Neuropathy    PVD (peripheral vascular disease) ([place])    Stroke  ([place]) 01/2022   L hand weakness    Past Surgical History:  Procedure Laterality Date   ABDOMINAL AORTOGRAM W/LOWER EXTREMITY N/A 06/10/2022   Procedure: ABDOMINAL AORTOGRAM W/LOWER EXTREMITY;  Surgeon: Marty Heck, MD;  Location: [facility];  Service: Cardiovascular;  Laterality: N/A;   AMPUTATION Left 09/02/2021   Procedure: AMPUTATION RAY;  Surgeon: Criselda Peaches, DPM;  Location: [facility];  Service: Podiatry;  Laterality: Left;  sagittal saw, 3L bag saline & Pulse   Cardiac catherization     CHOLECYSTECTOMY     I & D EXTREMITY Left 08/30/2021   Procedure: IRRIGATION AND DEBRIDEMENT WITH BONE BIOPSY;  Surgeon: [name], DPM;  Location: [facility];  Service: Podiatry;  Laterality: Left;   IR ANGIO VERTEBRAL SEL SUBCLAVIAN INNOMINATE UNI L MOD SED  01/18/2022   IR CT HEAD LTD  01/08/2022   IR INTRA CRAN STENT  01/08/2022   IR PERCUTANEOUS ART THROMBECTOMY/INFUSION INTRACRANIAL INC DIAG ANGIO  01/08/2022   IRRIGATION AND DEBRIDEMENT FOOT Left 09/04/2021   Procedure: IRRIGATION AND DEBRIDEMENT FOOT AND CLOSURE;  Surgeon: Criselda Peaches, DPM;  Location: [facility];  Service: Podiatry;  Laterality: Left;   PERIPHERAL VASCULAR BALLOON ANGIOPLASTY Left 06/10/2022   Procedure: PERIPHERAL VASCULAR BALLOON ANGIOPLASTY;  Surgeon: Marty Heck, MD;  Location: [facility];  Service: Cardiovascular;  Laterality: Left;  AT and DP   PERIPHERAL VASCULAR INTERVENTION Left 06/10/2022   Procedure: PERIPHERAL VASCULAR INTERVENTION;  Surgeon: Marty Heck, MD;  Location: [facility];  Service: Cardiovascular;  Laterality: Left;  SFA   RADIOLOGY WITH ANESTHESIA N/A 01/08/2022   Procedure: IR WITH ANESTHESIA;  Surgeon: Radiologist, Medication, MD;  Location: [facility];  Service: Radiology;  Laterality: N/A;   THROMBECTOMY BRACHIAL ARTERY Right 11/18/2021   Procedure: RIGHT BRACHIAL, RADIAL, & ULNAR ARTERY THROMBECTOMY.;  Surgeon: Marty Heck, MD;  Location: [facility];  Service: Vascular;   Laterality: Right;   TUBAL LIGATION      Allergies  Allergen Reactions   Visipaque [Iodixanol] Nausea And Vomiting    Patient had vagal response during procedure became hypertensive, and bradycardic. CO2 used during procedure prior to contrast being used, this may be cause as well. Recommended premedicating prior to contrast being used in the future.    Wellbutrin [Bupropion] Hives   Cefepime Rash    Tolerates penicilllin   Ciprofloxacin Hcl Hives and Rash    Hives/rash at injection site; 01/15/22 tolerated IV cipro   Tape Rash    Prior to Admission medications   Medication Sig Start Date End Date Taking? Authorizing Provider  acetaminophen (TYLENOL) 325 MG tablet Take 1-2 tablets (325-650 mg total) by mouth every 4 (four) hours as needed for mild pain. Patient taking differently: Take 650 mg by mouth as needed for mild pain. 02/18/22   Love, Ivan Anchors, PA-C  amoxicillin-clavulanate (AUGMENTIN) 875-125 MG tablet Take 1 tablet by mouth 2 (two) times daily. 05/25/22   McDonald, Stephan Minister, DPM  atorvastatin (LIPITOR) 40 MG tablet Take 1 tablet (40 mg total) by mouth daily. Courtesy fill/ pt to get established with a primary MD for further refills 06/01/22 11/28/22  Johnette Abraham, MD  clopidogrel (PLAVIX) 75 MG tablet Take 1 tablet (75 mg total) by mouth daily. 04/29/22   Garvin Fila, MD  Continuous Blood Gluc Sensor (DEXCOM G6 SENSOR) MISC Change sensor every 10 days as directed 09/29/21   Brita Romp, NP  diclofenac Sodium (VOLTAREN ARTHRITIS PAIN) 1 % GEL Apply 2 g topically 4 (four) times daily. Patient taking differently: Apply 1 Application topically 2 (two) times daily as needed (pain). 04/19/22   Jennye Boroughs, MD  enoxaparin (LOVENOX) 150 MG/ML injection Inject 1 mL (150 mg total) into the skin daily for 10 days. 06/06/22 06/16/22  Arnoldo Lenis, MD  furosemide (LASIX) 40 MG tablet Take 40 mg daily as needed for swelling 03/05/22   Arnoldo Lenis, MD  hydrALAZINE  (APRESOLINE) 25 MG tablet Take 0.5 tablets (12.5 mg total) by mouth 2 (two) times daily. 05/10/22   Arnoldo Lenis, MD  ibuprofen (ADVIL) 200 MG tablet Take 400 mg by mouth as needed for moderate pain or mild pain.    [provider]  insulin aspart (NOVOLOG) 100 UNIT/ML injection Use with Omnipod for daily dose around 80 units daily 06/17/22   Brita Romp, NP  Insulin Disposable Pump (OMNIPOD 5 G6 PODS, GEN 5,) MISC Change pod every 48-72 hours 06/17/22   Brita Romp, NP  Insulin Disposable Pump (OMNIPOD 5 G6 PODS, GEN 5,) MISC Change pod every 48-72 hours 07/07/22   Brita Romp, NP  isosorbide mononitrate (ISMO) 20 MG tablet Take 1 tablet (20 mg total) by mouth 2 (two) times daily. Courtesy fill/ pt to get established with a primary MD for further refills 06/01/22 11/28/22  [name],  [name], MD  levothyroxine (SYNTHROID) 125 MCG tablet Take 1 tablet (125 mcg total) by mouth daily at 6 (six) AM. 06/17/22 06/12/23  Brita Romp, NP  magnesium oxide (MAG-OX) 400 MG tablet Take 0.5 tablets (200 mg total) by mouth at bedtime. Patient taking differently: Take 400 mg by mouth daily. 03/12/22   Benay Pike, MD  methocarbamol (ROBAXIN) 500 MG tablet Take 1 tablet (500 mg total) by mouth every 6 (six) hours as needed for muscle spasms. Patient taking differently: Take 500 mg by mouth as needed for muscle spasms. 04/05/22   Johnette Abraham, MD  metoprolol succinate (TOPROL XL) 50 MG 24 hr tablet Take 1 tablet (50 mg total) by mouth daily. 05/10/22   Arnoldo Lenis, MD  metoprolol succinate (TOPROL-XL) 25 MG 24 hr tablet Take 25 mg by mouth in the morning and at bedtime.    [provider]  Multiple Vitamin (MULTIVITAMIN WITH MINERALS) TABS tablet Take 1 tablet by mouth daily. 02/18/22   Love, Ivan Anchors, PA-C  nitroGLYCERIN (NITROSTAT) 0.4 MG SL tablet Place 1 tablet (0.4 mg total) under the tongue every 5 (five) minutes x 3 doses as needed for chest pain. 02/18/22    Love, Ivan Anchors, PA-C  ondansetron (ZOFRAN) 4 MG tablet Take 1 tablet (4 mg total) by mouth every 8 (eight) hours as needed for nausea or vomiting. 03/01/22   Lovorn, Jinny Blossom, MD  oxyCODONE (OXY IR/ROXICODONE) 5 MG immediate release tablet Take 1 tablet (5 mg total) by mouth every 6 (six) hours as needed for severe pain. 07/08/22   McDonald, Stephan Minister, DPM  pantoprazole (PROTONIX) 40 MG tablet Take 1 tablet (40 mg total) by mouth daily. Courtesy fill/ pt to get established with a primary MD for further refills Patient taking differently: Take 40 mg by mouth daily as needed (reflux). 06/01/22 11/28/22  Johnette Abraham, MD  polyethylene glycol (MIRALAX / GLYCOLAX) 17 g packet Take 17 g by mouth 2 (two) times daily. Patient taking differently: Take 17 g by mouth as needed for moderate constipation. 03/03/22   Bayard Hugger, NP  ramelteon (ROZEREM) 8 MG tablet Take 1 tablet (8 mg total) by mouth at bedtime. 02/19/22   Love, Ivan Anchors, PA-C  ranolazine (RANEXA) 500 MG 12 hr tablet Take 1 tablet (500 mg total) by mouth 2 (two) times daily. Courtesy fill/ pt to get established with a primary MD for further refills 06/01/22 08/30/22  Johnette Abraham, MD  SANTYL 250 UNIT/GM ointment Apply 1 Application topically daily. 05/31/22   [provider]  tiZANidine (ZANAFLEX) 4 MG tablet Take 1 tablet (4 mg total) by mouth at bedtime. Courtesy fill/ pt to get established with a primary MD for further refills 06/01/22   Johnette Abraham, MD  traZODone (DESYREL) 50 MG tablet Take 1 tablet (50 mg total) by mouth at bedtime. 07/09/22   Jennye Boroughs, MD  Vitamin D, Ergocalciferol, (DRISDOL) 1.25 MG (50000 UNIT) CAPS capsule Take 1 capsule (50,000 Units total) by mouth every 7 (seven) days for 8 doses. 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  Tobacco Use   Smoking status: Former    Packs/day: 1.00    Years: 15.00    Additional pack years: 0.00    Total pack years: 15.00    Types: Cigarettes    Quit date: 01/2022    Years since quitting: 0.5    Passive exposure: Past   Smokeless tobacco: Never  Vaping Use   Vaping Use: Never used  Substance and Sexual Activity   Alcohol use: Yes    Comment: occ   Drug use: No   Sexual activity: Yes    Birth control/protection: Surgical    Comment: tubal   Other Topics Concern   Not on file  Social History Narrative   Lives with husband and kids   Right handed   Drinks 9+ cups caffeine daily   Social Determinants of Health   Financial Resource Strain: Low Risk  (03/03/2022)   Overall Financial Resource Strain (CARDIA)    Difficulty of Paying Living Expenses: Not very hard  Recent Concern: Emergency planning/management officer Strain - High Risk (01/14/2022)   Overall Financial Resource Strain (CARDIA)    Difficulty of Paying Living Expenses: Hard  Food Insecurity: No Food Insecurity (03/03/2022)   Hunger Vital Sign    Worried About Running Out of Food in the Last Year: Never true    Ran Out of Food in the Last Year: Never true  Recent Concern: Food Insecurity - Food Insecurity Present (01/16/2022)   Hunger Vital Sign    Worried About Running Out of Food in the Last Year: Sometimes true    Ran Out of Food in the Last Year: Sometimes true  Transportation Needs: No Transportation Needs (03/03/2022)   PRAPARE - Hydrologist (Medical): No    Lack of Transportation (Non-Medical): No  Physical Activity: Inactive (03/03/2022)   Exercise Vital Sign    Days of Exercise per Week: 0 days    Minutes of  Exercise per Session: 0 min  Stress: No Stress Concern Present (03/03/2022)   [place]    Feeling of Stress : Only a little  Recent Concern: Stress - Stress Concern Present (12/22/2021)   [place]    Feeling of Stress : To some extent  Social Connections: Socially Integrated (03/03/2022)   Social Connection and Isolation Panel [NHANES]    Frequency of Communication with Friends and Family: More than three times a week    Frequency of Social Gatherings with Friends and Family: More than three times a week    Attends Religious Services: More than 4 times per year    Active Member of [employer] or Organizations: Yes    Attends Music therapist: More than 4 times per year    Marital Status: Married  Human resources officer Violence: Not At Risk (03/03/2022)   Humiliation, Afraid, Rape, and Kick questionnaire    Fear of Current or Ex-Partner: No    Emotionally Abused: No    Physically Abused: No    Sexually Abused: No     Family History  Problem Relation Age of Onset   Thyroid disease Mother    Hypertension Mother    Hyperlipidemia Mother    CAD Mother    CVA Mother    Hyperlipidemia Father    Hypertension  Father    CAD Father    Breast cancer Paternal Grandmother 50   Cancer Paternal Grandmother        lung    ROS: [x]  Positive   [ ]  Negative   [ ]  All sytems reviewed and are negative  Cardiac: []  chest pain/pressure []  hx MI []  SOB   Vascular: []  pain in legs while walking []  pain in legs at rest []  pain in legs at night [x]  non-healing ulcers []  hx of DVT []  swelling in legs  Pulmonary: []  asthma/wheezing []  home O2  Neurologic: [x]  hx of CVA []  mini stroke   Hematologic: []  hx of cancer  Endocrine:   [x]  diabetes []  thyroid disease  GI []  GERD  GU: []  CKD/renal failure []  HD--[]  M/W/F or []  T/T/S  Psychiatric: []   anxiety []  depression  Musculoskeletal: []  arthritis []  joint pain  Integumentary: []  rashes []  ulcers  Constitutional: []  fever  [x]  chills  Physical Examination  Vitals:   07/19/22 1422 07/19/22 1505  BP:  138/83  Pulse:  99  Resp:  18  Temp: 97.6 F (36.4 C) 97.8 F (36.6 C)  SpO2:  100%   Body mass index is 32.45 kg/m.  General:  WDWN in NAD Gait: Not observed HENT: WNL, normocephalic Pulmonary: normal non-labored breathing Cardiac: regular, without carotid bruit Abdomen:  soft, NT; aortic pulse is non palpable Skin: without rashes Vascular Exam/Pulses: Brisk L AT and PT doppler signals Extremities: non healing diabetic ulcer of left 5th metatarsal with surrounding erythema and swelling Musculoskeletal: no muscle wasting or atrophy  Neurologic: A&O X 3 Psychiatric:  The pt has Normal affect.     CBC    Component Value Date/Time   WBC 13.6 (H) 07/19/2022 0533   RBC 3.13 (L) 07/19/2022 0533   HGB 9.1 (L) 07/19/2022 0533   HGB 11.0 (L) 04/20/2022 1022   HCT 28.0 (L) 07/19/2022 0533   HCT 36.0 04/20/2022 1022   PLT 506 (H) 07/19/2022 0533   PLT 415 04/20/2022 1022   MCV 89.5 07/19/2022 0533   MCV 90 04/20/2022 1022   MCH 29.1 07/19/2022 0533   MCHC 32.5 07/19/2022 0533   RDW 13.8 07/19/2022 0533   RDW 14.5 04/20/2022 1022   LYMPHSABS 2.3 05/08/2022 2150   LYMPHSABS 1.6 04/20/2022 1022   MONOABS 0.6 05/08/2022 2150   EOSABS 0.2 05/08/2022 2150   EOSABS 0.2 04/20/2022 1022   BASOSABS 0.0 05/08/2022 2150   BASOSABS 0.0 04/20/2022 1022    BMET    Component Value Date/Time   NA 134 (L) 07/19/2022 0533   NA 135 04/20/2022 1022   K 3.8 07/19/2022 0533   CL 102 07/19/2022 0533   CO2 22 07/19/2022 0533   GLUCOSE 114 (H) 07/19/2022 0533   BUN 35 (H) 07/19/2022 0533   BUN 24 (H) 04/20/2022 1022   CREATININE 2.68 (H) 07/19/2022 0533   CALCIUM 8.1 (L) 07/19/2022 0533   GFRNONAA 22 (L) 07/19/2022 0533   GFRAA 31 (L) 02/01/2020 1328     COAGS: Lab Results  Component Value Date   INR 2.6 (H) 07/19/2022   INR 2.6 07/06/2022   INR 2.3 06/22/2022     Non-Invasive Vascular Imaging:   ABIs and LLE arterial duplex ordered  Left foot MRI demonstrates osteomyelitis of the residual fifth metatarsal  ASSESSMENT/PLAN: This is a 42 y.o. female with left foot osteomyelitis   -The patient has recently been feeling ill and noticed purulent drainage from her left 5th metatarsal wound  over the last week. She has not been able to take her previously prescribed antibiotics. MRI of the left foot demonstrates osteomyelitis of the residual fifth metatarsal  -She recently underwent LLE angiogram with SFA stenting and angioplasty of the left AT and DP. She was considered vascularly optimized at the time. She was recommended to take plavix and her coumadin, which she has been doing -On exam she has a worsening appearance of her left foot wound with surrounding erythema. She has brisk AT and PT doppler signals -We will obtain ABIs and LLE arterial duplex studies to ensure that her SFA stent is open -Recommend podiatry evaluation and further treatment recommendations  Vicente Serene, PA-C [facility] [phone]   I have seen and evaluated the patient. I agree with the PA note as documented above.  42 year old female well-known to vascular surgery that we were asked to evaluate for left foot wound with evidence of osteomyelitis on MRI.  She is well-known to [name] and previously underwent right upper extremity thrombectomy last year on 11/18/2021.  Most recently she underwent a left leg intervention on 06/10/2022 by myself with angioplasty and stenting of the SFA as well as anterior tibial and dorsalis pedis angioplasty for critical limb ischemia with tissue loss.  She is actually scheduled to see [name] tomorrow in the office for follow-up.  She states she came to the ED due to nausea and chills with concern for infection of her foot.   MRI left foot shows prior amputation of the left fifth ray with concern for osteo of the residual metatarsal that was performed by podiatry.  Agree with broad broad-spectrum IV antibiotics.  Would engage podiatry as they have her scheduled for foot debridement in several weeks and have been following her wound.  I think she is optimized from our standpoint.  She has brisk Doppler signals on exam that are multiphasic after recent left leg revascularization.  Will get a left leg arterial duplex given she will miss her surveillance tomorrow in the office.  Continue Plavix statin for risk reduction and recent SFA stent.  We will follow.  Marty Heck, MD [facility] [place] Office: [phone]

## 2022-07-20 ENCOUNTER — Ambulatory Visit (HOSPITAL_COMMUNITY): Payer: BC Managed Care – PPO

## 2022-07-20 ENCOUNTER — Inpatient Hospital Stay (HOSPITAL_COMMUNITY): Payer: BC Managed Care – PPO

## 2022-07-20 DIAGNOSIS — I5020 Unspecified systolic (congestive) heart failure: Secondary | ICD-10-CM

## 2022-07-20 DIAGNOSIS — N184 Chronic kidney disease, stage 4 (severe): Secondary | ICD-10-CM

## 2022-07-20 DIAGNOSIS — I739 Peripheral vascular disease, unspecified: Secondary | ICD-10-CM

## 2022-07-20 DIAGNOSIS — E11628 Type 2 diabetes mellitus with other skin complications: Secondary | ICD-10-CM | POA: Diagnosis not present

## 2022-07-20 DIAGNOSIS — E1022 Type 1 diabetes mellitus with diabetic chronic kidney disease: Secondary | ICD-10-CM | POA: Diagnosis not present

## 2022-07-20 DIAGNOSIS — L089 Local infection of the skin and subcutaneous tissue, unspecified: Secondary | ICD-10-CM | POA: Diagnosis not present

## 2022-07-20 DIAGNOSIS — E782 Mixed hyperlipidemia: Secondary | ICD-10-CM

## 2022-07-20 DIAGNOSIS — M869 Osteomyelitis, unspecified: Secondary | ICD-10-CM

## 2022-07-20 LAB — GLUCOSE, CAPILLARY
Glucose-Capillary: 224 mg/dL — ABNORMAL HIGH (ref 70–99)
Glucose-Capillary: 275 mg/dL — ABNORMAL HIGH (ref 70–99)
Glucose-Capillary: 356 mg/dL — ABNORMAL HIGH (ref 70–99)
Glucose-Capillary: 224 mg/dL — ABNORMAL HIGH (ref 70–99)
Glucose-Capillary: 146 mg/dL — ABNORMAL HIGH (ref 70–99)

## 2022-07-20 LAB — CBC
HCT: 25.8 % — ABNORMAL LOW (ref 36.0–46.0)
Hemoglobin: 8.1 g/dL — ABNORMAL LOW (ref 12.0–15.0)
MCH: 27.9 pg (ref 26.0–34.0)
MCHC: 31.4 g/dL (ref 30.0–36.0)
MCV: 89 fL (ref 80.0–100.0)
Platelets: 446 10*3/uL — ABNORMAL HIGH (ref 150–400)
RBC: 2.9 MIL/uL — ABNORMAL LOW (ref 3.87–5.11)
RDW: 14 % (ref 11.5–15.5)
WBC: 11.7 10*3/uL — ABNORMAL HIGH (ref 4.0–10.5)
nRBC: 0 % (ref 0.0–0.2)

## 2022-07-20 LAB — BASIC METABOLIC PANEL
Anion gap: 14 (ref 5–15)
BUN: 41 mg/dL — ABNORMAL HIGH (ref 6–20)
CO2: 18 mmol/L — ABNORMAL LOW (ref 22–32)
Calcium: 8.1 mg/dL — ABNORMAL LOW (ref 8.9–10.3)
Chloride: 102 mmol/L (ref 98–111)
Creatinine, Ser: 2.8 mg/dL — ABNORMAL HIGH (ref 0.44–1.00)
GFR, Estimated: 21 mL/min — ABNORMAL LOW (ref >60)
Glucose, Bld: 173 mg/dL — ABNORMAL HIGH (ref 70–99)
Potassium: 4.4 mmol/L (ref 3.5–5.1)
Sodium: 134 mmol/L — ABNORMAL LOW (ref 135–145)

## 2022-07-20 LAB — PROTIME-INR
INR: 3.4 — ABNORMAL HIGH (ref 0.8–1.2)
Prothrombin Time: 34 seconds — ABNORMAL HIGH (ref 11.4–15.2)

## 2022-07-20 LAB — VAS US ABI WITH/WO TBI
Left ABI: 0.88
Right ABI: 0.94

## 2022-07-20 LAB — SURGICAL PCR SCREEN
MRSA, PCR: POSITIVE — AB
Staphylococcus aureus: POSITIVE — AB

## 2022-07-20 MED ORDER — ZINC SULFATE 220 (50 ZN) MG PO CAPS
220.0000 mg | ORAL_CAPSULE | Freq: Every day | ORAL | Status: DC
  Administered 2022-07-20 – 2022-07-27 (×8): 220 mg via ORAL
  Filled 2022-07-20 (×8): qty 1

## 2022-07-20 MED ORDER — INSULIN ASPART 100 UNIT/ML IJ SOLN
8.0000 [IU] | Freq: Three times a day (TID) | INTRAMUSCULAR | Status: DC
  Administered 2022-07-20 – 2022-07-21 (×3): 8 [IU] via SUBCUTANEOUS

## 2022-07-20 MED ORDER — VITAMIN C 500 MG PO TABS
500.0000 mg | ORAL_TABLET | Freq: Every day | ORAL | Status: DC
  Administered 2022-07-20 – 2022-07-27 (×8): 500 mg via ORAL
  Filled 2022-07-20 (×8): qty 1

## 2022-07-20 MED ORDER — CHLORHEXIDINE GLUCONATE CLOTH 2 % EX PADS
6.0000 | MEDICATED_PAD | Freq: Every day | CUTANEOUS | Status: AC
  Administered 2022-07-21 – 2022-07-24 (×3): 6 via TOPICAL

## 2022-07-20 MED ORDER — WARFARIN - PHARMACIST DOSING INPATIENT: Freq: Every day | Status: DC

## 2022-07-20 MED ORDER — VITAMIN K1 10 MG/ML IJ SOLN
5.0000 mg | Freq: Once | INTRAVENOUS | Status: AC
  Administered 2022-07-20: 5 mg via INTRAVENOUS
  Filled 2022-07-20: qty 0.5

## 2022-07-20 MED ORDER — INSULIN GLARGINE-YFGN 100 UNIT/ML ~~LOC~~ SOLN
28.0000 [IU] | Freq: Every day | SUBCUTANEOUS | Status: DC
  Administered 2022-07-21: 28 [IU] via SUBCUTANEOUS
  Filled 2022-07-20 (×2): qty 0.28

## 2022-07-20 MED ORDER — ADULT MULTIVITAMIN W/MINERALS CH
1.0000 | ORAL_TABLET | Freq: Every day | ORAL | Status: DC
  Administered 2022-07-20 – 2022-07-27 (×8): 1 via ORAL
  Filled 2022-07-20 (×8): qty 1

## 2022-07-20 NOTE — Progress Notes (Signed)
   PODIATRY PROGRESS NOTE  NAME Lauren [name] MRN [id number] DOB 1980/08/14 DOA 07/19/2022   Reason for consult:  Chief Complaint  Patient presents with   Chest Pain     History of present illness: 42 y.o. female with worsening infection, wound of the left foot.  States that she is going to be chest pain but she still having nausea.  She states that she did get sick last night.  Denies any fevers or chills.  Vitals:   07/20/22 0355 07/20/22 0735  BP: 118/72 (!) 143/92  Pulse: 83 97  Resp: 18   Temp: 98.2 F (36.8 C) 97.7 F (36.5 C)  SpO2: 98% 100%       Latest Ref Rng & Units 07/20/2022    1:27 AM 07/19/2022    5:33 AM 06/10/2022    6:00 AM  CBC  WBC 4.0 - 10.5 K/uL 11.7  13.6    Hemoglobin 12.0 - 15.0 g/dL 8.1  9.1  11.6   Hematocrit 36.0 - 46.0 % 25.8  28.0  34.0   Platelets 150 - 400 K/uL 446  506         Latest Ref Rng & Units 07/20/2022    1:27 AM 07/19/2022    5:33 AM 06/10/2022    6:00 AM  BMP  Glucose 70 - 99 mg/dL 173  114  148   BUN 6 - 20 mg/dL 41  35  34   Creatinine 0.44 - 1.00 mg/dL 2.80  2.68  3.10   Sodium 135 - 145 mmol/L 134  134  140   Potassium 3.5 - 5.1 mmol/L 4.4  3.8  3.8   Chloride 98 - 111 mmol/L 102  102  104   CO2 22 - 32 mmol/L 18  22    Calcium 8.9 - 10.3 mg/dL 8.1  8.1        Physical Exam: General: AAOx3, NAD  Dermatology: Full-thickness ulceration of the lateral aspect of the foot with malodor present.  There is localized edema and erythema.  There is no fluctuation or crepitation.  Vascular: Foot is warm and well-perfused.  Neurological: Sensation decreased  Musculoskeletal Exam: No pain on exam    ASSESSMENT/PLAN OF CARE Osteomyelitis Left foot  Surgery was canceled today and tomorrow given increased INR.  Daily dressing today we again discussed the surgery.  Patient states that she is okay with proceeding with fourth ray amputation if needed as well will plan for surgery tomorrow.  N.p.o. after midnight.  No further  questions.    Please contact me directly with any questions or concerns.     Celesta Gentile, DPM [facility]  Dr. Bonna Gains. [address], [address] [place], [place] [address]                Office [phone]  Fax [phone]

## 2022-07-20 NOTE — Progress Notes (Signed)
PROGRESS NOTE        PATIENT DETAILS Name: Lauren [name] Age: 42 y.o. Sex: female Date of Birth: [birth year] Admit Date: 07/19/2022 Admitting Physician Karmen Bongo, MD [id number], [name], MD  Brief Summary: Patient is a 42 y.o.  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   Significant events: 3/18>> admit to [facility]  Significant studies: 3/18>> MRI left foot: Acute osteomyelitis of residual fifth metatarsal 3/18>> CXR: No PNA  Significant microbiology data: 3/18>> blood culture: No growth  Procedures: None  Consults: Podiatry Vascular surgery  Subjective: Lying comfortably in bed-denies any chest pain or shortness of breath.  Objective: Vitals: Blood pressure (!) 143/92, pulse 97, temperature 97.7 F (36.5 C), temperature source Oral, resp. rate 18, height 5\' 5"  (1.651 m), weight 88.5 kg, last menstrual period 07/18/2022, SpO2 100 %.   Exam: Gen Exam:Alert awake-not in any distress HEENT:atraumatic, normocephalic Chest: B/L clear to auscultation anteriorly CVS:S1S2 regular Abdomen:soft non tender, non distended Extremities:no edema Neurology: Non focal Skin: no rash  Pertinent Labs/Radiology:    Latest Ref Rng & Units 07/20/2022    1:27 AM 07/19/2022    5:33 AM 06/10/2022    6:00 AM  CBC  WBC 4.0 - 10.5 K/uL 11.7  13.6    Hemoglobin 12.0 - 15.0 g/dL 8.1  9.1  11.6   Hematocrit 36.0 - 46.0 % 25.8  28.0  34.0   Platelets 150 - 400 K/uL 446  506      Lab Results  Component Value Date   NA 134 (L) 07/20/2022   K 4.4 07/20/2022   CL 102 07/20/2022   CO2 18 (L) 07/20/2022      Assessment/Plan: Left diabetic foot Acute osteomyelitis of  fifth metatarsal Continue IV antibiotics Will schedule for left fifth metatarsal excision/debridement today-however procedure was canceled due to supratherapeutic INR Per vascular surgery-patient is optimized-as she is s/p recent left leg revascularization. Podiatry following-or now scheduled for tomorrow.  Will get a low-dose of IV vitamin K today.  PAD S/p recent left leg revascularization Continue Plavix  Chronic HFrEF Remains euvolemic  CAD (moderate to severe disease on LHC 2015-managed medically) No anginal symptoms Continue Plavix/statin/beta-blocker/Ranexa  History of CVA-s/p thrombectomy 2023-with mild left residual hemiparesis Continue antiplatelet/statin On Coumadin as outpatient-currently on hold as scheduled for OR on 3/20 Have sent epic secure chat to primary hematologist-Dr.Iruku-whether patient still needs to be on anticoagulation (see recent outpatient notes from neurology/oncology) Since INR supratherapeutic-plan of 5 mg of vitamin K today as patient is scheduled for tomorrow.  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    CKD stage IV Close to baseline Follow electrolytes Avoid nephrotoxic agents  DM-2 (A1C 7.0 on 3/18) with an controlled hyperglycemia Increase Semglee to 28 units, increase Premeal NovoLog to 8 units Follow CBGs and adjust accordingly.  Recent Labs    07/19/22 2346 07/20/22 0355 07/20/22 0731  GLUCAP 153* 224* 275*     HLD Statin  Hypothyroidism Synthroid  Obesity: Estimated body mass index is 32.45 kg/m as calculated from the following:   Height as of this encounter: 5'  5" (1.651 m).   Weight as of this encounter: 88.5 kg.   Code status:   Code Status: DNR   DVT Prophylaxis: SCD's Start: 07/19/22 2032   Family Communication: None at bedside   Disposition Plan: Status is: Inpatient Remains inpatient appropriate because: Severity of  illness   Planned Discharge Destination:Home health   Diet: Diet Order             Diet heart healthy/carb modified Fluid consistency: Thin  Diet effective now                     Antimicrobial agents: Anti-infectives (From admission, onward)    Start     Dose/Rate Route Frequency Ordered Stop   07/21/22 1000  vancomycin (VANCOCIN) IVPB 1000 mg/200 mL premix        1,000 mg 200 mL/hr over 60 Minutes Intravenous Every 48 hours 07/19/22 1003     07/19/22 1415  metroNIDAZOLE (FLAGYL) IVPB 500 mg        500 mg 100 mL/hr over 60 Minutes Intravenous Every 12 hours 07/19/22 1413     07/19/22 1015  vancomycin (VANCOREADY) IVPB 2000 mg/400 mL        2,000 mg 200 mL/hr over 120 Minutes Intravenous  Once 07/19/22 1003 07/19/22 1321   07/19/22 1015  ceFEPIme (MAXIPIME) 2 g in sodium chloride 0.9 % 100 mL IVPB        2 g 200 mL/hr over 30 Minutes Intravenous Every 12 hours 07/19/22 1003          MEDICATIONS: Scheduled Meds:  atorvastatin  40 mg Oral Daily   clopidogrel  75 mg Oral Daily   docusate sodium  100 mg Oral BID   hydrALAZINE  12.5 mg Oral BID   insulin aspart  0-5 Units Subcutaneous QHS   insulin aspart  0-9 Units Subcutaneous TID WC   insulin aspart  5 Units Subcutaneous TID WC   insulin glargine-yfgn  25 Units Subcutaneous Daily   isosorbide mononitrate  20 mg Oral BID   levothyroxine  125 mcg Oral Q0600   magnesium oxide  400 mg Oral Daily   metoprolol succinate  50 mg Oral Daily   mupirocin ointment  1 Application Nasal BID   ranolazine  500 mg Oral BID   tiZANidine  4 mg Oral QHS   traZODone  50 mg Oral QHS   Warfarin - Pharmacist Dosing Inpatient   Does not apply q1600   Continuous Infusions:  ceFEPime (MAXIPIME) IV 2 g (07/19/22 2247)   metronidazole 500 mg (07/20/22 0109)   phytonadione (VITAMIN K) 5 mg in dextrose 5 % 50 mL IVPB     [START ON 07/21/2022] vancomycin     PRN Meds:.acetaminophen **OR** acetaminophen, alum & mag hydroxide-simeth,  bisacodyl, hydrALAZINE, ondansetron **OR** ondansetron (ZOFRAN) IV, oxyCODONE, polyethylene glycol   I have personally reviewed following labs and imaging studies  LABORATORY DATA: CBC: Recent Labs  Lab 07/19/22 0533 07/20/22 0127  WBC 13.6* 11.7*  HGB 9.1* 8.1*  HCT 28.0* 25.8*  MCV 89.5 89.0  PLT 506* 446*    Basic Metabolic Panel: Recent Labs  Lab 07/19/22 0533 07/20/22 0127  NA 134* 134*  K 3.8 4.4  CL 102 102  CO2 22 18*  GLUCOSE 114* 173*  BUN 35* 41*  CREATININE 2.68* 2.80*  CALCIUM 8.1* 8.1*    GFR: Estimated Creatinine Clearance: 29.3 mL/min (A) (by C-G formula based on SCr of 2.8 mg/dL (H)).  Liver Function Tests: No  results for input(s): "AST", "ALT", "ALKPHOS", "BILITOT", "PROT", "ALBUMIN" in the last 168 hours. No results for input(s): "LIPASE", "AMYLASE" in the last 168 hours. No results for input(s): "AMMONIA" in the last 168 hours.  Coagulation Profile: Recent Labs  Lab 07/19/22 0635 07/20/22 0127  INR 2.6* 3.4*    Cardiac Enzymes: No results for input(s): "CKTOTAL", "CKMB", "CKMBINDEX", "TROPONINI" in the last 168 hours.  BNP (last 3 results) No results for input(s): "PROBNP" in the last 8760 hours.  Lipid Profile: No results for input(s): "CHOL", "HDL", "LDLCALC", "TRIG", "CHOLHDL", "LDLDIRECT" in the last 72 hours.  Thyroid Function Tests: No results for input(s): "TSH", "T4TOTAL", "FREET4", "T3FREE", "THYROIDAB" in the last 72 hours.  Anemia Panel: No results for input(s): "VITAMINB12", "FOLATE", "FERRITIN", "TIBC", "IRON", "RETICCTPCT" in the last 72 hours.  Urine analysis:    Component Value Date/Time   COLORURINE YELLOW 01/09/2022 0225   APPEARANCEUR CLEAR 01/09/2022 0225   LABSPEC 1.045 (H) 01/09/2022 0225   PHURINE 7.0 01/09/2022 0225   GLUCOSEU 50 (A) 01/09/2022 0225   HGBUR NEGATIVE 01/09/2022 0225   BILIRUBINUR NEGATIVE 01/09/2022 0225   KETONESUR NEGATIVE 01/09/2022 0225   PROTEINUR >=300 (A) 01/09/2022 0225    UROBILINOGEN 0.2 06/23/2014 1227   NITRITE NEGATIVE 01/09/2022 0225   LEUKOCYTESUR TRACE (A) 01/09/2022 0225    Sepsis Labs: Lactic Acid, Venous    Component Value Date/Time   LATICACIDVEN 0.7 07/19/2022 0918    MICROBIOLOGY: Recent Results (from the past 240 hour(s))  Blood culture (routine x 2)     Status: None (Preliminary result)   Collection Time: 07/19/22  9:14 AM   Specimen: BLOOD RIGHT FOREARM  Result Value Ref Range Status   Specimen Description BLOOD RIGHT FOREARM  Final   Special Requests   Final    BOTTLES DRAWN AEROBIC AND ANAEROBIC Blood Culture adequate volume   Culture   Final    NO GROWTH < 24 HOURS Performed at [facility], [place] [address] [place], [place] [address]    Report Status PENDING  Incomplete  Blood culture (routine x 2)     Status: None (Preliminary result)   Collection Time: 07/19/22  9:29 AM   Specimen: BLOOD RIGHT HAND  Result Value Ref Range Status   Specimen Description BLOOD RIGHT HAND  Final   Special Requests   Final    BOTTLES DRAWN AEROBIC AND ANAEROBIC Blood Culture adequate volume   Culture   Final    NO GROWTH 1 DAY Performed at [facility], [place] [address], [place], [place] [address]    Report Status PENDING  Incomplete  Surgical PCR screen     Status: Abnormal   Collection Time: 07/19/22 11:00 PM   Specimen: Nasal Mucosa; Nasal Swab  Result Value Ref Range Status   MRSA, PCR POSITIVE (A) NEGATIVE Final    Comment: RESULT CALLED TO, READ BACK BY AND VERIFIED WITH: C ALLEN,RN@0059  07/20/22 [place]    Staphylococcus aureus POSITIVE (A) NEGATIVE Final    Comment: (NOTE) The Xpert SA Assay (FDA approved for NASAL specimens in patients 28 years of age and older), is one component of a comprehensive surveillance program. It is not intended to diagnose infection nor to guide or monitor treatment. Performed at [facility], [place] [address], [place],  [address]     RADIOLOGY STUDIES/RESULTS: MR FOOT LEFT  WO CONTRAST  Result Date: 07/19/2022 CLINICAL DATA:  Left foot wound EXAM: MRI OF THE LEFT FOOT WITHOUT CONTRAST TECHNIQUE: Multiplanar, multisequence  MR imaging of the left forefoot was performed. No intravenous contrast was administered. COMPARISON:  X-ray 07/19/2022, MRI 01/02/2022 FINDINGS: Bones/Joint/Cartilage Prior transmetatarsal amputation of the fifth ray. 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 Displaced ununited fracture of the distal shaft of the fourth metatarsal without residual bone marrow edema. Minimal bone marrow edema of the fourth metatarsal base, nonspecific and may be reactive. Trace fluid in the fourth and fifth tarsometatarsal joints. Remaining osseous structures are within normal limits. No additional fractures. No dislocation. Ligaments Intact Lisfranc ligament. Remaining collateral ligaments are intact. Muscles and Tendons Denervation changes of the foot musculature.  No tenosynovitis. Soft tissues Wound or ulceration at the lateral foot adjacent to the fifth metatarsal base. Generalized soft tissue swelling and edema. No organized or drainable fluid collections. IMPRESSION: 1. Prior transmetatarsal amputation of the fifth ray. Wound or ulceration at the lateral foot adjacent to the fifth metatarsal base. 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 2. Minimal bone marrow edema of the fourth metatarsal base, nonspecific and may be reactive. Trace fluid in the fourth and fifth tarsometatarsal joints, likely reactive although developing septic arthritis not excluded. 3. Generalized soft tissue edema of the left foot. No organized or drainable fluid collections. 4. Displaced ununited fracture of the distal shaft of the fourth metatarsal without residual bone marrow edema. Electronically Signed   By:  Davina Poke D.O.   On: 07/19/2022 10:33   DG Foot Complete Left  Result Date: 07/19/2022 CLINICAL DATA:  Lateral foot wound, concern for osteomyelitis, pain, previous fifth toe amputation April 2023 EXAM: LEFT FOOT - COMPLETE 3+ VIEW COMPARISON:  01/30/2022 FINDINGS: Previous left fifth toe amputation at the level of the fifth metatarsal mid shaft as before. Lateral foot soft tissue wound over the fifth metatarsal base area. There is underlying fifth metatarsal base cortical irregularity and subcortical lucency when compared to the prior study suspicious for osteomyelitis. Healing left fourth metatarsal fracture with callus formation. Peripheral vascular calcifications noted. No joint abnormality. IMPRESSION: 1. Findings suspicious for left fifth metatarsal base osteomyelitis with overlying soft tissue wound. 2. Healing left fourth metatarsal fracture. 3. Previous left fifth toe amputation. Electronically Signed   By: Jerilynn Mages.  Shick M.D.   On: 07/19/2022 08:47   DG Chest 2 View  Result Date: 07/19/2022 CLINICAL DATA:  42 year old female with history of chest pain. EXAM: CHEST - 2 VIEW COMPARISON:  Chest x-ray 02/08/2022. FINDINGS: Lung volumes are low. No consolidative airspace disease. No pleural effusions. No pneumothorax. No pulmonary nodule or mass noted. Pulmonary vasculature and the cardiomediastinal silhouette are within normal limits. IMPRESSION: 1. Low lung volumes without radiographic evidence of acute cardiopulmonary disease. Electronically Signed   By: Vinnie Langton M.D.   On: 07/19/2022 06:04     LOS: 1 day   Oren Binet, MD  [facility]    To contact the attending provider between 7A-7P or the covering provider during after hours 7P-7A, please log into the web site [email or web address] and access using universal [place] password for that web site. If you do not have the password, please call the hospital operator.  07/20/2022, 10:32 AM

## 2022-07-20 NOTE — Progress Notes (Signed)
Initial Nutrition Assessment  DOCUMENTATION CODES:   Obesity unspecified  INTERVENTION:  Liberalize diet from heart healthy/carb modified to carb modified to provide wider range of nutrition to optimize PO intake Double protein portions with all meals MVI with minerals daily Vitamin C 500mg  daily x30 days Zinc 220mg  daily x14 days  NUTRITION DIAGNOSIS:   Increased nutrient needs related to wound healing as evidenced by estimated needs.  GOAL:   Patient will meet greater than or equal to 90% of their needs   MONITOR:   PO intake, Labs, Weight trends  REASON FOR ASSESSMENT:   Consult Wound healing  ASSESSMENT:   Pt admitted with c/o chest pain, n/v and foot infection. PMH significant for CAD, CKD IV, chronic systolic CHF, DM, CVA requiring thrombectomy (01/2022), PAD.   MRI L foot findings of acute osteomyelitis of residual 5th ray amputation  Plans for OR tomorrow for excision and debridement  Pt sitting up on EOB at time of visit. She states that during admission she has not been eating well as she does not enjoy the food provided on the Heart Healthy diet restriction. She did not eat breakfast or lunch today. Her husband brought in some snacks and salad dressing that she enjoys. Pt states that PTA she had been eating really well. She had just had a cookout at her house the day of admission.   Pt states that she had just recently switched to an insulin pump at home and is hopeful following surgery to be able to transition from injections to her pump as this has made it easier for her to control her blood sugars.    Pt noted to have had a stroke in September 2023 and has residual L arm weakness. She denies difficulty chewing, swallowing or self feeding. She only had difficulty with medications which is aided by taking with applesauce.   Reviewed prior RD notes. Pt has previously been ordered Juven and ProSource Plus, to which pt recalls and does not enjoy these and would  prefer not to receive if possible. She is agreeable to vitamins to support wound healing for now. Also encouraged focus on protein with all meals/snacks when feasible.   Pt states that her weight has remained stable recently. Reviewed weight history. Pt's weight appears to have been widely variable within the last year between 83-97 kg. Question whether all weights are accurate. Suspect fluctuation to occur with measurements on different scales. Current weight noted to be 88.5 kg. Will continue to monitor trends throughout admission.   Medications: colace, SSI 0-5 units qhs, SSI 0-9 units TID, SSI 8 units TID, semglee 28 units daily, mag ox, warfarin, IV abx  Labs: sodium 134, BUN 41, Cr 2.80, GFR 21, HgbA1c 7.0%, CBG's 153-356 x24 hours  NUTRITION - FOCUSED PHYSICAL EXAM:  Flowsheet Row Most Recent Value  Orbital Region No depletion  Upper Arm Region No depletion  Thoracic and Lumbar Region No depletion  Buccal Region No depletion  Temple Region No depletion  Clavicle Bone Region No depletion  Clavicle and Acromion Bone Region No depletion  Scapular Bone Region No depletion  Dorsal Hand No depletion  Patellar Region No depletion  Anterior Thigh Region No depletion  Posterior Calf Region No depletion  Edema (RD Assessment) Mild  [LUE, LLE]  Hair Reviewed  Eyes Reviewed  Mouth Reviewed  Skin Reviewed  Nails Reviewed       Diet Order:   Diet Order  Diet Carb Modified Fluid consistency: Thin; Room service appropriate? Yes  Diet effective now                   EDUCATION NEEDS:   Education needs have been addressed  Skin:  Skin Assessment: Reviewed RN Assessment (L foot osteo)  Last BM:  3/17  Height:   Ht Readings from Last 1 Encounters:  07/19/22 5\' 5"  (1.651 m)    Weight:   Wt Readings from Last 1 Encounters:  07/19/22 88.5 kg    Ideal Body Weight:  56.2 kg  BMI:  Body mass index is 32.45 kg/m.  Estimated Nutritional Needs:   Kcal:   1600-1800  Protein:  80-95g  Fluid:  >/=1.6L  Clayborne Dana, RDN, LDN Clinical Nutrition

## 2022-07-20 NOTE — Inpatient Diabetes Management (Signed)
Inpatient Diabetes Program Recommendations  AACE/ADA: New Consensus Statement on Inpatient Glycemic Control (2015)  Target Ranges:  Prepandial:   less than 140 mg/dL      Peak postprandial:   less than 180 mg/dL (1-2 hours)      Critically ill patients:  140 - 180 mg/dL   Lab Results  Component Value Date   GLUCAP 275 (H) 07/20/2022   HGBA1C 7.0 (H) 07/19/2022    Review of Glycemic Control  Latest Reference Range & Units 07/19/22 19:30 07/19/22 23:46 07/20/22 03:55 07/20/22 07:31  Glucose-Capillary 70 - 99 mg/dL 207 (H) 153 (H) 224 (H) 275 (H)  (H): Data is abnormally high Diabetes history: Type 1 DM Outpatient Diabetes medications:  Omnipod- Goal blood sugar 120 mg/dL , Carbohydrate ratio 1 unit/8 grams of CHO, Active insulin time: 3 hours Per Whitney with [facility]-last settings on 01/10/22 Basal rate 12a-12p = 0.9 units/hr--total 24 hr basal 21.6 units  Current orders for Inpatient glycemic control: Novolog 0-9 units TID & HS, Novolog 5 units TID, Semglee 25 units QD  Inpatient Diabetes Program Recommendations:    Consider: -Increasing Semglee 28 units QD - Increasing Novolog 7 units TID (Assuming patient is consuming >50% of meals)  Thanks, Bronson Curb, MSN, RNC-OB Diabetes Coordinator [phone] (8a-5p)

## 2022-07-20 NOTE — H&P (View-Only) (Signed)
   PODIATRY PROGRESS NOTE  NAME Olean Basher MRN TO:[id number] DOB Nov 07, 1980 DOA 07/19/2022   Reason for consult:  Chief Complaint  Patient presents with   Chest Pain     History of present illness: 42 y.o. female with worsening infection, wound of the left foot.  States that she is going to be chest pain but she still having nausea.  She states that she did get sick last night.  Denies any fevers or chills.  Vitals:   07/20/22 0355 07/20/22 0735  BP: 118/72 (!) 143/92  Pulse: 83 97  Resp: 18   Temp: 98.2 F (36.8 C) 97.7 F (36.5 C)  SpO2: 98% 100%       Latest Ref Rng & Units 07/20/2022    1:27 AM 07/19/2022    5:33 AM 06/10/2022    6:00 AM  CBC  WBC 4.0 - 10.5 K/uL 11.7  13.6    Hemoglobin 12.0 - 15.0 g/dL 8.1  9.1  11.6   Hematocrit 36.0 - 46.0 % 25.8  28.0  34.0   Platelets 150 - 400 K/uL 446  506         Latest Ref Rng & Units 07/20/2022    1:27 AM 07/19/2022    5:33 AM 06/10/2022    6:00 AM  BMP  Glucose 70 - 99 mg/dL 173  114  148   BUN 6 - 20 mg/dL 41  35  34   Creatinine 0.44 - 1.00 mg/dL 2.80  2.68  3.10   Sodium 135 - 145 mmol/L 134  134  140   Potassium 3.5 - 5.1 mmol/L 4.4  3.8  3.8   Chloride 98 - 111 mmol/L 102  102  104   CO2 22 - 32 mmol/L 18  22    Calcium 8.9 - 10.3 mg/dL 8.1  8.1        Physical Exam: General: AAOx3, NAD  Dermatology: Full-thickness ulceration of the lateral aspect of the foot with malodor present.  There is localized edema and erythema.  There is no fluctuation or crepitation.  Vascular: Foot is warm and well-perfused.  Neurological: Sensation decreased  Musculoskeletal Exam: No pain on exam    ASSESSMENT/PLAN OF CARE Osteomyelitis Left foot  Surgery was canceled today and tomorrow given increased INR.  Daily dressing today we again discussed the surgery.  Patient states that she is okay with proceeding with fourth ray amputation if needed as well will plan for surgery tomorrow.  N.p.o. after midnight.  No further  questions.    Please contact me directly with any questions or concerns.     Celesta Gentile, DPM [facility]  Dr. Bonna Gains. [address], [place] [address], [place] [address]                Office [phone]  Fax [phone]

## 2022-07-20 NOTE — Consult Note (Signed)
   [facility] CM Inpatient Consult   07/20/2022  Lauren [name] 1980/06/07 TO:[id number]  [facility] [[facility]] Patient: Lauren Poot [facility] PPO  Primary Care Provider:  Johnette Abraham, MD with [facility]   Patient screened for hospitalization with noted extreme high risk score for unplanned readmission risk and to assess for potential restart [place] Management service needs for post hospital transition for care coordination.  Review of patient's electronic medical record reveals patient is for potential surgical intervention upcoming.  Met with the patient at the bedside, she was quietly napping, contact precautions observed and donned. Explained reason for visit.  Patient acknowledges she was previously active with [facility] LCSW and RN in the past.  Explained contact attempts for community care coordination.  Patient states she no longer had LCSW needs. Explained could follow up for post hospital needs after procedure and she verbalized, "that'll be fine."   Plan:  Continue to follow progress and disposition to assess for post hospital community care coordination/management needs.  Referral request for community care coordination: pending disposition needs, following.  Of note, [facility] Care Management/Population Health does not replace or interfere with any arrangements made by the Inpatient Transition of Care team.  For questions contact:   Natividad Brood, RN BSN [place]  [phone] business mobile phone Toll free office [phone]  *[place]  [phone] Fax number: [phone] [email or web address]

## 2022-07-21 ENCOUNTER — Inpatient Hospital Stay (HOSPITAL_COMMUNITY): Payer: BC Managed Care – PPO

## 2022-07-21 ENCOUNTER — Inpatient Hospital Stay (HOSPITAL_COMMUNITY): Payer: BC Managed Care – PPO | Admitting: Anesthesiology

## 2022-07-21 ENCOUNTER — Other Ambulatory Visit: Payer: Self-pay

## 2022-07-21 ENCOUNTER — Encounter: Payer: Self-pay | Admitting: Neurology

## 2022-07-21 ENCOUNTER — Encounter: Payer: Self-pay | Admitting: Internal Medicine

## 2022-07-21 ENCOUNTER — Ambulatory Visit: Payer: BC Managed Care – PPO | Admitting: Neurology

## 2022-07-21 ENCOUNTER — Encounter (HOSPITAL_COMMUNITY)
Admission: EM | Disposition: A | Discharge status: Home / Self Care | Payer: Self-pay | Source: Home / Self Care | Attending: Internal Medicine

## 2022-07-21 ENCOUNTER — Encounter (HOSPITAL_COMMUNITY): Payer: Self-pay | Admitting: Internal Medicine

## 2022-07-21 DIAGNOSIS — L089 Local infection of the skin and subcutaneous tissue, unspecified: Secondary | ICD-10-CM | POA: Diagnosis not present

## 2022-07-21 DIAGNOSIS — E782 Mixed hyperlipidemia: Secondary | ICD-10-CM | POA: Diagnosis not present

## 2022-07-21 DIAGNOSIS — M86072 Acute hematogenous osteomyelitis, left ankle and foot: Secondary | ICD-10-CM

## 2022-07-21 DIAGNOSIS — I5020 Unspecified systolic (congestive) heart failure: Secondary | ICD-10-CM | POA: Diagnosis not present

## 2022-07-21 DIAGNOSIS — E11628 Type 2 diabetes mellitus with other skin complications: Secondary | ICD-10-CM | POA: Diagnosis not present

## 2022-07-21 DIAGNOSIS — E1022 Type 1 diabetes mellitus with diabetic chronic kidney disease: Secondary | ICD-10-CM | POA: Diagnosis not present

## 2022-07-21 HISTORY — PX: METATARSAL OSTEOTOMY: SHX1641

## 2022-07-21 LAB — BASIC METABOLIC PANEL
Anion gap: 8 (ref 5–15)
BUN: 45 mg/dL — ABNORMAL HIGH (ref 6–20)
CO2: 21 mmol/L — ABNORMAL LOW (ref 22–32)
Calcium: 8.1 mg/dL — ABNORMAL LOW (ref 8.9–10.3)
Chloride: 107 mmol/L (ref 98–111)
Creatinine, Ser: 2.73 mg/dL — ABNORMAL HIGH (ref 0.44–1.00)
GFR, Estimated: 22 mL/min — ABNORMAL LOW (ref >60)
Glucose, Bld: 107 mg/dL — ABNORMAL HIGH (ref 70–99)
Potassium: 4.1 mmol/L (ref 3.5–5.1)
Sodium: 136 mmol/L (ref 135–145)

## 2022-07-21 LAB — CBC
HCT: 24 % — ABNORMAL LOW (ref 36.0–46.0)
Hemoglobin: 7.7 g/dL — ABNORMAL LOW (ref 12.0–15.0)
MCH: 28.6 pg (ref 26.0–34.0)
MCHC: 32.1 g/dL (ref 30.0–36.0)
MCV: 89.2 fL (ref 80.0–100.0)
Platelets: 443 10*3/uL — ABNORMAL HIGH (ref 150–400)
RBC: 2.69 MIL/uL — ABNORMAL LOW (ref 3.87–5.11)
RDW: 13.8 % (ref 11.5–15.5)
WBC: 11.2 10*3/uL — ABNORMAL HIGH (ref 4.0–10.5)
nRBC: 0 % (ref 0.0–0.2)

## 2022-07-21 LAB — GLUCOSE, CAPILLARY
Glucose-Capillary: 121 mg/dL — ABNORMAL HIGH (ref 70–99)
Glucose-Capillary: 125 mg/dL — ABNORMAL HIGH (ref 70–99)
Glucose-Capillary: 129 mg/dL — ABNORMAL HIGH (ref 70–99)
Glucose-Capillary: 152 mg/dL — ABNORMAL HIGH (ref 70–99)
Glucose-Capillary: 156 mg/dL — ABNORMAL HIGH (ref 70–99)
Glucose-Capillary: 209 mg/dL — ABNORMAL HIGH (ref 70–99)
Glucose-Capillary: 217 mg/dL — ABNORMAL HIGH (ref 70–99)
Glucose-Capillary: 269 mg/dL — ABNORMAL HIGH (ref 70–99)

## 2022-07-21 LAB — PROTIME-INR
INR: 1.5 — ABNORMAL HIGH (ref 0.8–1.2)
Prothrombin Time: 18.4 seconds — ABNORMAL HIGH (ref 11.4–15.2)

## 2022-07-21 SURGERY — OSTEOTOMY, METATARSAL BONE
Anesthesia: Monitor Anesthesia Care | Site: Foot | Laterality: Left

## 2022-07-21 MED ORDER — CHLORHEXIDINE GLUCONATE 0.12 % MT SOLN
15.0000 mL | Freq: Once | OROMUCOSAL | Status: AC
  Administered 2022-07-21: 15 mL via OROMUCOSAL

## 2022-07-21 MED ORDER — DEXAMETHASONE SODIUM PHOSPHATE 10 MG/ML IJ SOLN
INTRAMUSCULAR | Status: DC | PRN
  Administered 2022-07-21: 5 mg via INTRAVENOUS

## 2022-07-21 MED ORDER — ACETAMINOPHEN 500 MG PO TABS
ORAL_TABLET | ORAL | Status: AC
  Administered 2022-07-21: 1000 mg via ORAL
  Filled 2022-07-21: qty 2

## 2022-07-21 MED ORDER — ONDANSETRON HCL 4 MG/2ML IJ SOLN
INTRAMUSCULAR | Status: DC | PRN
  Administered 2022-07-21: 4 mg via INTRAVENOUS

## 2022-07-21 MED ORDER — LIDOCAINE 2% (20 MG/ML) 5 ML SYRINGE
INTRAMUSCULAR | Status: AC
  Filled 2022-07-21: qty 5

## 2022-07-21 MED ORDER — PROPOFOL 10 MG/ML IV BOLUS
INTRAVENOUS | Status: AC
  Filled 2022-07-21: qty 20

## 2022-07-21 MED ORDER — ONDANSETRON HCL 4 MG/2ML IJ SOLN
INTRAMUSCULAR | Status: AC
  Filled 2022-07-21: qty 2

## 2022-07-21 MED ORDER — DEXAMETHASONE SODIUM PHOSPHATE 10 MG/ML IJ SOLN
INTRAMUSCULAR | Status: AC
  Filled 2022-07-21: qty 1

## 2022-07-21 MED ORDER — FENTANYL CITRATE PF 50 MCG/ML IJ SOSY: 50.0000 ug | PREFILLED_SYRINGE | Freq: Once | INTRAMUSCULAR | Status: AC

## 2022-07-21 MED ORDER — LIDOCAINE HCL (PF) 1 % IJ SOLN
INTRAMUSCULAR | Status: AC
  Filled 2022-07-21: qty 30

## 2022-07-21 MED ORDER — INSULIN ASPART 100 UNIT/ML IJ SOLN
INTRAMUSCULAR | Status: AC
  Filled 2022-07-21: qty 1

## 2022-07-21 MED ORDER — ORAL CARE MOUTH RINSE: 15.0000 mL | Freq: Once | OROMUCOSAL | Status: AC

## 2022-07-21 MED ORDER — ROPIVACAINE HCL 5 MG/ML IJ SOLN
INTRAMUSCULAR | Status: DC | PRN
  Administered 2022-07-21: 10 mL via PERINEURAL
  Administered 2022-07-21: 30 mL via PERINEURAL

## 2022-07-21 MED ORDER — PROCHLORPERAZINE EDISYLATE 10 MG/2ML IJ SOLN
5.0000 mg | Freq: Four times a day (QID) | INTRAMUSCULAR | Status: DC | PRN
  Administered 2022-07-22 – 2022-07-24 (×4): 5 mg via INTRAVENOUS
  Filled 2022-07-21 (×4): qty 2

## 2022-07-21 MED ORDER — MIDAZOLAM HCL 2 MG/2ML IJ SOLN
INTRAMUSCULAR | Status: AC
  Administered 2022-07-21: 2 mg via INTRAVENOUS
  Filled 2022-07-21: qty 2

## 2022-07-21 MED ORDER — PROCHLORPERAZINE EDISYLATE 10 MG/2ML IJ SOLN
5.0000 mg | Freq: Once | INTRAMUSCULAR | Status: AC
  Administered 2022-07-21: 5 mg via INTRAVENOUS
  Filled 2022-07-21: qty 2

## 2022-07-21 MED ORDER — ENOXAPARIN SODIUM 100 MG/ML IJ SOSY
90.0000 mg | PREFILLED_SYRINGE | INTRAMUSCULAR | Status: DC
  Administered 2022-07-21 – 2022-07-22 (×2): 90 mg via SUBCUTANEOUS
  Filled 2022-07-21 (×2): qty 1

## 2022-07-21 MED ORDER — 0.9 % SODIUM CHLORIDE (POUR BTL) OPTIME
TOPICAL | Status: DC | PRN
  Administered 2022-07-21: 1000 mL

## 2022-07-21 MED ORDER — PROPOFOL 500 MG/50ML IV EMUL
INTRAVENOUS | Status: DC | PRN
  Administered 2022-07-21: 125 ug/kg/min via INTRAVENOUS

## 2022-07-21 MED ORDER — BUPIVACAINE HCL (PF) 0.5 % IJ SOLN
INTRAMUSCULAR | Status: AC
  Filled 2022-07-21: qty 30

## 2022-07-21 MED ORDER — WARFARIN SODIUM 7.5 MG PO TABS
7.5000 mg | ORAL_TABLET | Freq: Once | ORAL | Status: AC
  Administered 2022-07-21: 7.5 mg via ORAL
  Filled 2022-07-21: qty 1

## 2022-07-21 MED ORDER — MIDAZOLAM HCL 2 MG/2ML IJ SOLN: 2.0000 mg | Freq: Once | INTRAMUSCULAR | Status: AC

## 2022-07-21 MED ORDER — SODIUM CHLORIDE 0.9 % IV SOLN: INTRAVENOUS | Status: DC

## 2022-07-21 MED ORDER — ACETAMINOPHEN 500 MG PO TABS: 1000.0000 mg | ORAL_TABLET | Freq: Once | ORAL | Status: AC

## 2022-07-21 MED ORDER — PROPOFOL 1000 MG/100ML IV EMUL
INTRAVENOUS | Status: AC
  Filled 2022-07-21: qty 100

## 2022-07-21 MED ORDER — INSULIN ASPART 100 UNIT/ML IJ SOLN: 0.0000 [IU] | INTRAMUSCULAR | Status: DC | PRN

## 2022-07-21 MED ORDER — FENTANYL CITRATE (PF) 100 MCG/2ML IJ SOLN
INTRAMUSCULAR | Status: AC
  Administered 2022-07-21: 50 ug
  Filled 2022-07-21: qty 2

## 2022-07-21 SURGICAL SUPPLY — 54 items
BAG COUNTER SPONGE SURGICOUNT (BAG) ×2 IMPLANT
BAG SPNG CNTER NS LX DISP (BAG) ×1
BANDAGE ESMARK 6X9 LF (GAUZE/BANDAGES/DRESSINGS) IMPLANT
BLADE AVERAGE 25X9 (BLADE) ×2 IMPLANT
BLADE SURG 10 STRL SS (BLADE) ×2 IMPLANT
BNDG CMPR 9X6 STRL LF SNTH (GAUZE/BANDAGES/DRESSINGS)
BNDG COHESIVE 4X5 TAN STRL (GAUZE/BANDAGES/DRESSINGS) ×2 IMPLANT
BNDG ELASTIC 4X5.8 VLCR STR LF (GAUZE/BANDAGES/DRESSINGS) IMPLANT
BNDG ESMARK 6X9 LF (GAUZE/BANDAGES/DRESSINGS) IMPLANT
BNDG GAUZE DERMACEA FLUFF 4 (GAUZE/BANDAGES/DRESSINGS) IMPLANT
BNDG GZE DERMACEA 4 6PLY (GAUZE/BANDAGES/DRESSINGS) ×1
CUFF TOURN SGL QUICK 34 (TOURNIQUET CUFF)
CUFF TOURN SGL QUICK 42 (TOURNIQUET CUFF) IMPLANT
CUFF TRNQT CYL 34X4.125X (TOURNIQUET CUFF) IMPLANT
DRAPE U-SHAPE 47X51 STRL (DRAPES) ×4 IMPLANT
DURAPREP 26ML APPLICATOR (WOUND CARE) ×2 IMPLANT
ELECT REM PT RETURN 9FT ADLT (ELECTROSURGICAL) ×1 IMPLANT
ELECTRODE REM PT RTRN 9FT ADLT (ELECTROSURGICAL) ×2 IMPLANT
GAUZE PACKING IODOFORM 1/4X15 (PACKING) IMPLANT
GAUZE PAD ABD 8X10 STRL (GAUZE/BANDAGES/DRESSINGS) IMPLANT
GAUZE SPONGE 4X4 12PLY STRL (GAUZE/BANDAGES/DRESSINGS) ×2 IMPLANT
GAUZE XEROFORM 5X9 LF (GAUZE/BANDAGES/DRESSINGS) ×2 IMPLANT
GLOVE BIO SURGEON STRL SZ8 (GLOVE) ×4 IMPLANT
GOWN STRL REUS W/ TWL LRG LVL3 (GOWN DISPOSABLE) ×2 IMPLANT
GOWN STRL REUS W/ TWL XL LVL3 (GOWN DISPOSABLE) ×2 IMPLANT
GOWN STRL REUS W/TWL LRG LVL3 (GOWN DISPOSABLE) ×1
GOWN STRL REUS W/TWL XL LVL3 (GOWN DISPOSABLE) ×1
KIT BASIN OR (CUSTOM PROCEDURE TRAY) ×2 IMPLANT
KIT TURNOVER KIT B (KITS) ×2 IMPLANT
NDL 25GX 5/8IN NON SAFETY (NEEDLE) ×2 IMPLANT
NDL BIOPSY JAMSHIDI 8X6 (NEEDLE) IMPLANT
NEEDLE 25GX 5/8IN NON SAFETY (NEEDLE) ×1 IMPLANT
NEEDLE BIOPSY JAMSHIDI 8X6 (NEEDLE) ×1 IMPLANT
NS IRRIG 1000ML POUR BTL (IV SOLUTION) ×2 IMPLANT
PACK ORTHO EXTREMITY (CUSTOM PROCEDURE TRAY) ×2 IMPLANT
PAD ARMBOARD 7.5X6 YLW CONV (MISCELLANEOUS) ×4 IMPLANT
PAD CAST 4YDX4 CTTN HI CHSV (CAST SUPPLIES) ×2 IMPLANT
PADDING CAST COTTON 4X4 STRL (CAST SUPPLIES)
SPIKE FLUID TRANSFER (MISCELLANEOUS) ×2 IMPLANT
SPONGE SURGIFOAM ABS GEL 100C (HEMOSTASIS) IMPLANT
SPONGE T-LAP 18X18 ~~LOC~~+RFID (SPONGE) ×4 IMPLANT
STAPLER VISISTAT 35W (STAPLE) ×2 IMPLANT
STOCKINETTE IMPERVIOUS LG (DRAPES) IMPLANT
SUT ETHILON 2 0 PSLX (SUTURE) IMPLANT
SUT ETHILON 3 0 PS 1 (SUTURE) IMPLANT
SUT PROLENE 3 0 PS 2 (SUTURE) ×4 IMPLANT
SWAB COLLECTION DEVICE MRSA (MISCELLANEOUS) IMPLANT
SWAB CULTURE ESWAB REG 1ML (MISCELLANEOUS) IMPLANT
SYR CONTROL 10ML LL (SYRINGE) ×2 IMPLANT
TOWEL GREEN STERILE (TOWEL DISPOSABLE) ×2 IMPLANT
TOWEL GREEN STERILE FF (TOWEL DISPOSABLE) ×2 IMPLANT
TUBE CONNECTING 12X1/4 (SUCTIONS) ×2 IMPLANT
WATER STERILE IRR 1000ML POUR (IV SOLUTION) ×2 IMPLANT
YANKAUER SUCT BULB TIP NO VENT (SUCTIONS) ×2 IMPLANT

## 2022-07-21 NOTE — Anesthesia Procedure Notes (Signed)
Anesthesia Regional Block: Popliteal block   Pre-Anesthetic Checklist: , timeout performed,  Correct Patient, Correct Site, Correct Laterality,  Correct Procedure, Correct Position, site marked,  Risks and benefits discussed,  Surgical consent,  Pre-op evaluation,  At surgeon's request and post-op pain management  Laterality: Left  Prep: chloraprep       Needles:  Injection technique: Single-shot  Needle Type: Echogenic Needle     Needle Length: 9cm  Needle Gauge: 21     Additional Needles:   Procedures:,,,, ultrasound used (permanent image in chart),,    Narrative:  Start time: 07/21/2022 3:23 PM End time: 07/21/2022 3:30 PM Injection made incrementally with aspirations every 5 mL.  Performed by: Personally  Anesthesiologist: Santa Lighter, MD  Additional Notes: No pain on injection. No increased resistance to injection. Injection made in 5cc increments.  Good needle visualization.  Patient tolerated procedure well.

## 2022-07-21 NOTE — Progress Notes (Addendum)
  Progress Note    07/21/2022 9:49 AM * No surgery date entered *  Subjective: No complaints   Vitals:   07/21/22 0417 07/21/22 0728  BP: 115/75 (!) 150/87  Pulse: 88 89  Resp: 18 17  Temp: 98.8 F (37.1 C) 98.1 F (36.7 C)  SpO2: 100% 100%   Physical Exam: Lungs:  non labored Extremities:  dressing left in place L foot Neurologic: A&O  CBC    Component Value Date/Time   WBC 11.2 (H) 07/21/2022 0201   RBC 2.69 (L) 07/21/2022 0201   HGB 7.7 (L) 07/21/2022 0201   HGB 11.0 (L) 04/20/2022 1022   HCT 24.0 (L) 07/21/2022 0201   HCT 36.0 04/20/2022 1022   PLT 443 (H) 07/21/2022 0201   PLT 415 04/20/2022 1022   MCV 89.2 07/21/2022 0201   MCV 90 04/20/2022 1022   MCH 28.6 07/21/2022 0201   MCHC 32.1 07/21/2022 0201   RDW 13.8 07/21/2022 0201   RDW 14.5 04/20/2022 1022   LYMPHSABS 2.3 05/08/2022 2150   LYMPHSABS 1.6 04/20/2022 1022   MONOABS 0.6 05/08/2022 2150   EOSABS 0.2 05/08/2022 2150   EOSABS 0.2 04/20/2022 1022   BASOSABS 0.0 05/08/2022 2150   BASOSABS 0.0 04/20/2022 1022    BMET    Component Value Date/Time   NA 136 07/21/2022 0201   NA 135 04/20/2022 1022   K 4.1 07/21/2022 0201   CL 107 07/21/2022 0201   CO2 21 (L) 07/21/2022 0201   GLUCOSE 107 (H) 07/21/2022 0201   BUN 45 (H) 07/21/2022 0201   BUN 24 (H) 04/20/2022 1022   CREATININE 2.73 (H) 07/21/2022 0201   CALCIUM 8.1 (L) 07/21/2022 0201   GFRNONAA 22 (L) 07/21/2022 0201   GFRAA 31 (L) 02/01/2020 1328    INR    Component Value Date/Time   INR 1.5 (H) 07/21/2022 0201    No intake or output data in the 24 hours ending 07/21/22 0949   Assessment/Plan:  42 y.o. female with L foot wound  Plans noted for debridement with podiatry in the operating room today Duplex from yesterday demonstrates a widely patent left SFA stent.  She does have an elevated velocity in the distal ATA of the left leg however this was occluded prior to endovascular intervention by Dr. Carlis Abbott last month.  Continue  aspirin and plavix.  Hopefully wound bed will be well-perfused in the OR today   Dagoberto Ligas, PA-C [facility] [phone] 07/21/2022 9:49 AM

## 2022-07-21 NOTE — Brief Op Note (Signed)
07/21/2022  5:05 PM  PATIENT:  Lauren [name]  42 y.o. female  PRE-OPERATIVE DIAGNOSIS:  OSTEOMYELITIS OF LEFT FOOT  POST-OPERATIVE DIAGNOSIS:  OSTEOMYELITIS OF LEFT FOOT  PROCEDURE:  Procedure(s): LEFT FOOT EXCISION OF FIFTH METATARSAL WITH DEBRIDEMENT OF ULCER, POSSIBLE BONE BIOPSY OF THE FOURTH METATARSAL, POSSIBLE FOURTH RAY AMPUTATION OF LEFT TOEAND METATARSAL (Left)  SURGEON:  Surgeon(s) and Role:    * Trula Slade, DPM - Primary  PHYSICIAN ASSISTANT:   ASSISTANTS: none   ANESTHESIA:   General  EBL:  20 mL   BLOOD ADMINISTERED:none  DRAINS: none   LOCAL MEDICATIONS USED:  NONE  SPECIMEN:  Source of Specimen:  5th metatarsal for path, 4th metatarsal for path/micro  DISPOSITION OF SPECIMEN:  PATHOLOGY  COUNTS:  YES  TOURNIQUET:  * No tourniquets in log *  DICTATION: .Dragon Dictation  PLAN OF CARE: Admit to inpatient   PATIENT DISPOSITION:  PACU - hemodynamically stable.   Delay start of Pharmacological VTE agent (>24hrs) due to surgical blood loss or risk of bleeding: no  Intraoperative findings: 5th metatarsal fragmented at the base. Peroneal tendon necrotic distally. 4th metatarsal appears viable but took bone culture/path. No fluid along the 4th TMT. Incision closed mostly but with packing centrally. Will plan for dressing change tomorrow. Await culture. NWB

## 2022-07-21 NOTE — Interval H&P Note (Signed)
History and Physical Interval Note:  07/21/2022 3:10 PM  Lauren [name]  has presented today for surgery, with the diagnosis of OSTEOMYELITIS OF LEFT FOOT.  The various methods of treatment have been discussed with the patient and family. After consideration of risks, benefits and other options for treatment, the patient has consented to  Procedure(s): METATARSECTOMY (Left) as a surgical intervention.  The patient's history has been reviewed, patient examined, no change in status, stable for surgery.  I have reviewed the patient's chart and labs.  Questions were answered to the patient's satisfaction.     Trula Slade

## 2022-07-21 NOTE — Transfer of Care (Signed)
Immediate Anesthesia Transfer of Care Note  Patient: Lauren [name]  Procedure(s) Performed: LEFT FOOT EXCISION OF FIFTH METATARSAL WITH DEBRIDEMENT OF ULCER, POSSIBLE BONE BIOPSY OF THE FOURTH METATARSAL, POSSIBLE FOURTH RAY AMPUTATION OF LEFT TOEAND METATARSAL (Left: Foot)  Patient Location: PACU  Anesthesia Type:MAC and Regional  Level of Consciousness: awake and alert   Airway & Oxygen Therapy: Patient Spontanous Breathing  Post-op Assessment: Report given to RN and Post -op Vital signs reviewed and stable  Post vital signs: Reviewed and stable  Last Vitals:  Vitals Value Taken Time  BP 107/70 07/21/22 1710  Temp    Pulse 78 07/21/22 1712  Resp 18 07/21/22 1712  SpO2 95 % 07/21/22 1712  Vitals shown include unvalidated device data.  Last Pain:  Vitals:   07/21/22 1540  TempSrc:   PainSc: 0-No pain      Patients Stated Pain Goal: 1 ([id number])  Complications: No notable events documented.

## 2022-07-21 NOTE — TOC Initial Note (Signed)
Transition of Care [facility]) - Initial/Assessment Note    Patient Details  Name: Lauren [name] MRN: [id number] Date of Birth: 01/29/1981  [facility]) CM/SW Contact:    Sharin Mons, RN Phone Number: 07/21/2022, 7:46 AM  Clinical Narrative:                 Presents with c/o n/v, chest pain, diabetic L foot ulcer. Hx of CAD, CHF, DM2, CVA, neuropathy, MI, and PAD  From home with husband. States doesn't work. States has applied for Medicaid. PTA independent with ADL's. Owns RW and W/C.  Plan: left fifth metatarsal excision, debridement ulceration with possible bone biopsy of the fourth metatarsal, 3/20  TOC team following for needs...   Expected Discharge Plan: Home/Self Care Barriers to Discharge: Continued Medical Work up   Patient Goals and CMS Choice            Expected Discharge Plan and Services   Discharge Planning Services: CM Consult   Living arrangements for the past 2 months: Single Family Home                                      Prior Living Arrangements/Services Living arrangements for the past 2 months: Single Family Home   Patient language and need for interpreter reviewed:: Yes Do you feel safe going back to the place where you live?: Yes      Need for Family Participation in Patient Care: Yes (Comment) Care giver support system in place?: Yes (comment) Current home services: DME (W/C and RW) Criminal Activity/Legal Involvement Pertinent to Current Situation/Hospitalization: No - Comment as needed  Activities of Daily Living   ADL Screening (condition at time of admission) Patient's cognitive ability adequate to safely complete daily activities?: Yes Is the patient deaf or have difficulty hearing?: No Does the patient have difficulty seeing, even when wearing glasses/contacts?: No Does the patient have difficulty concentrating, remembering, or making decisions?: No Patient able to express need for assistance with ADLs?:  Yes Does the patient have difficulty dressing or bathing?: No Independently performs ADLs?: Yes (appropriate for developmental age) Does the patient have difficulty walking or climbing stairs?: No Weakness of Legs: None Weakness of Arms/Hands: None  Permission Sought/Granted   Permission granted to share information with : Yes, Verbal Permission Granted  Share Information with NAME: Jerelyn Scott  Spouse  [id number]           Emotional Assessment Appearance:: Appears stated age Attitude/Demeanor/Rapport: Engaged Affect (typically observed): Accepting Orientation: : Oriented to Self, Oriented to Place, Oriented to  Time, Oriented to Situation Alcohol / Substance Use: Not Applicable Psych Involvement: No (comment)  Admission diagnosis:  Diabetic foot infection ([place]) [id number], L08.9] Osteomyelitis, unspecified site, unspecified type [facility]) [M86.9] Patient Active Problem List   Diagnosis Date Noted   Osteomyelitis ([place]) 07/20/2022   Diabetic foot infection ([place]) 07/19/2022   PVD (peripheral vascular disease) ([place]) 07/19/2022   DNR (do not resuscitate) 07/19/2022   Critical limb ischemia of left lower extremity ([place]) 06/01/2022   Open abdominal wall wound, initial encounter 04/28/2022   Insomnia 04/07/2022   Encounter for therapeutic drug monitoring 04/07/2022   Ulcer of left foot due to type 2 diabetes mellitus ([place])    Acute on chronic combined systolic and diastolic CHF (congestive heart failure) ([place])    Ischemic cardiomyopathy    Acute right MCA stroke ([place]) 01/28/2022   Elevated troponin  Aspiration pneumonia ([place]) 01/18/2022   Acute respiratory failure with hypoxia (HCC)    HFrEF (heart failure with reduced ejection fraction) ([place])    Right carotid artery occlusion 01/08/2022   Internal carotid artery occlusion, 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   Menorrhagia with irregular cycle 12/15/2021   Pregnancy examination or test, negative result 12/15/2021   Encounter for IUD insertion  12/15/2021   Critical ischemia of upper extremity ([place]) 11/18/2021   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 ([place]) 11/18/2021   Cellulitis of left foot 09/02/2021   Acute osteomyelitis of left foot ([place]) 09/02/2021   Foot ulcer ([place]) 08/27/2021   Sepsis ([place]) 08/27/2021   Hypokalemia 08/27/2021   Cellulitis 06/18/2021   Chronic combined systolic and diastolic heart failure ([place]) 06/18/2021   History of CVA (cerebrovascular accident) 06/18/2021   Encounter for cervical Pap smear with pelvic exam 07/23/2020   Menorrhagia with regular cycle 07/23/2020   Iron deficiency anemia due to chronic blood loss 07/23/2020   Menstrual cramps 07/23/2020   Class 2 obesity 06/18/2020   CAD (coronary artery disease) 06/18/2020   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 ([place]) 06/18/2020   Unstable angina ([place]) 06/18/2020   Mixed hyperlipidemia    Class 2 obesity due to excess calories without serious comorbidity with body mass index (BMI) of 39.0 to 39.9 in adult    Acute renal failure superimposed on stage 4 chronic kidney disease ([place]) 01/01/2020   Acute ischemic stroke ([place]) 01/01/2020   Tobacco abuse 01/01/2020   Chest pain 12/31/2019   Cerebrovascular accident (CVA) ([place]) 12/31/2019   Left sided numbness 12/31/2019   Vitamin D deficiency 07/24/2015   Primary hypertension 03/17/2015   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[place]) 03/07/2015   Hypothyroidism 03/07/2015   PCP:  Johnette Abraham, MD Pharmacy:   [place], [place] [address] [place] [address] Phone: [phone] Fax: [phone]     Social Determinants of Health (SDOH) Social History: SDOH Screenings   Food Insecurity: No Food Insecurity (03/03/2022)  Recent Concern: Food Insecurity - Food Insecurity Present (01/16/2022)  Housing: Low Risk  (03/03/2022)  Transportation Needs: No Transportation Needs (03/03/2022)  Utilities: Not At Risk (03/03/2022)  Alcohol Screen: Low  Risk  (03/03/2022)  Depression (PHQ2-9): Low Risk  (06/01/2022)  Financial Resource Strain: Low Risk  (03/03/2022)  Recent Concern: Financial Resource Strain - High Risk (01/14/2022)  Physical Activity: Inactive (03/03/2022)  Social Connections: Socially Integrated (03/03/2022)  Stress: No Stress Concern Present (03/03/2022)  Recent Concern: Stress - Stress Concern Present (12/22/2021)  Tobacco Use: Medium Risk (07/19/2022)   SDOH Interventions:     Readmission Risk Interventions     No data to display

## 2022-07-21 NOTE — H&P (Signed)
Anesthesia H&P Update: History and Physical Exam reviewed; patient is OK for planned anesthetic and procedure. ? ?

## 2022-07-21 NOTE — Progress Notes (Addendum)
PROGRESS NOTE        PATIENT DETAILS Name: Lauren [name] Age: 42 y.o. Sex: female Date of Birth: 08/17/1980 Admit Date: 07/19/2022 Admitting Physician Karmen Bongo, MD [id number], [name], MD  Brief Summary: Patient is a 42 y.o.  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   Significant events: 3/18>> admit to [facility]  Significant studies: 3/18>> MRI left foot: Acute osteomyelitis of residual fifth metatarsal 3/18>> CXR: No PNA  Significant microbiology data: 3/18>> blood culture: No growth  Procedures: None  Consults: Podiatry Vascular surgery  Subjective: Vomited last night-received IV Compazine with good effect.  No other complaints.  Lying comfortably in bed.  No chest pain/shortness of breath.  Objective: Vitals: Blood pressure (!) 150/87, pulse 89, temperature 98.1 F (36.7 C), temperature source Oral, resp. rate 17, height 5\' 5"  (1.651 m), weight 88.5 kg, last menstrual period 07/18/2022, SpO2 100 %.   Exam: Gen Exam:Alert awake-not in any distress HEENT:atraumatic, normocephalic Chest: B/L clear to auscultation anteriorly CVS:S1S2 regular Abdomen:soft non tender, non distended Extremities:no edema Neurology: Non focal Skin: no rash  Pertinent Labs/Radiology:    Latest Ref Rng & Units 07/21/2022    2:01 AM 07/20/2022    1:27 AM 07/19/2022    5:33 AM  CBC  WBC 4.0 - 10.5 K/uL 11.2  11.7  13.6   Hemoglobin 12.0 - 15.0 g/dL 7.7  8.1  9.1   Hematocrit 36.0 - 46.0 % 24.0  25.8  28.0   Platelets 150 - 400 K/uL 443  446  506     Lab Results  Component Value Date   NA 136 07/21/2022   K 4.1 07/21/2022   CL 107 07/21/2022   CO2 21 (L)  07/21/2022     Assessment/Plan: Left diabetic foot Acute osteomyelitis of fifth metatarsal Continue IV antibiotics Scheduled for left fifth metatarsal excision on 3/20-now that I is acceptable. 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 Continue antiplatelet/statin On Coumadin as outpatient-currently on hold as scheduled for OR on 3/20 Case discussed with primary hematology-Dr Iruku-on 3/19-since neurology did not feel patient needed anticoagulation-patient has been arranged for a second hematology opinion at another institution.  Will plan to resume anticoagulation postoperatively once we are able-and continue with anticoagulation until she gets a second opinion.    CKD stage IV Close to baseline Follow electrolytes Avoid nephrotoxic agents  Normocytic anemia Baseline anemia due to CKD-likely worsened due to acute illness No evidence of blood loss Follow-and transfuse if Hb <7  DM-2 (A1C 7.0 on 3/18) with an controlled hyperglycemia Increase Semglee to 28 units, increase Premeal NovoLog to 8 units Follow CBGs and adjust accordingly.  Recent Labs    07/21/22 0418 07/21/22 0725 07/21/22 1111  GLUCAP 125* 156* 269*  HLD Statin  Hypothyroidism Synthroid  Obesity: Estimated body mass index is 32.45 kg/m as calculated from the following:   Height as of this encounter: 5\' 5"  (1.651 m).   Weight as of this encounter: 88.5 kg.   Code status:   Code Status: Full Code  (3/20>> wanted to revoke DNR-full code order entered)  DVT Prophylaxis: SCD's Start: 07/19/22 2032   Family Communication: None at bedside   Disposition Plan: Status is: Inpatient Remains inpatient appropriate because: Severity of illness   Planned Discharge Destination:Home health   Diet: Diet Order             Diet NPO time specified Except for: Sips with Meds, Other (See Comments)  Diet effective now                     Antimicrobial agents: Anti-infectives (From admission, onward)    Start     Dose/Rate Route Frequency Ordered Stop   07/21/22 1000  vancomycin (VANCOCIN) IVPB 1000 mg/200 mL premix        1,000 mg 200 mL/hr over 60 Minutes Intravenous Every 48 hours 07/19/22 1003     07/19/22 1415  metroNIDAZOLE (FLAGYL) IVPB 500 mg        500 mg 100 mL/hr over 60 Minutes Intravenous Every 12 hours 07/19/22 1413     07/19/22 1015  vancomycin (VANCOREADY) IVPB 2000 mg/400 mL        2,000 mg 200 mL/hr over 120 Minutes Intravenous  Once 07/19/22 1003 07/19/22 1321   07/19/22 1015  ceFEPIme (MAXIPIME) 2 g in sodium chloride 0.9 % 100 mL IVPB        2 g 200 mL/hr over 30 Minutes Intravenous Every 12 hours 07/19/22 1003          MEDICATIONS: Scheduled Meds:  ascorbic acid  500 mg Oral Daily   atorvastatin  40 mg Oral Daily   Chlorhexidine Gluconate Cloth  6 each Topical Q0600   clopidogrel  75 mg Oral Daily   docusate sodium  100 mg Oral BID   hydrALAZINE  12.5 mg Oral BID   insulin aspart  0-5 Units Subcutaneous QHS   insulin aspart  0-9 Units Subcutaneous TID WC   insulin aspart  8 Units Subcutaneous TID WC   insulin glargine-yfgn  28 Units Subcutaneous Daily   isosorbide mononitrate  20 mg Oral BID   levothyroxine  125 mcg Oral Q0600   magnesium oxide  400 mg Oral Daily   metoprolol succinate  50 mg Oral Daily   multivitamin with minerals  1 tablet Oral Daily   mupirocin ointment  1 Application Nasal BID   ranolazine  500 mg Oral BID   tiZANidine  4  mg Oral QHS   traZODone  50 mg Oral QHS   Warfarin - Pharmacist Dosing Inpatient   Does not apply q1600   zinc sulfate  220 mg Oral Daily   Continuous Infusions:  ceFEPime (MAXIPIME) IV 2 g (07/21/22 0900)   metronidazole 500 mg (07/21/22 0126)   vancomycin 1,000 mg (07/21/22 1012)   PRN Meds:.acetaminophen **OR** acetaminophen, alum & mag hydroxide-simeth, bisacodyl, hydrALAZINE, ondansetron **OR** ondansetron (ZOFRAN) IV, oxyCODONE, polyethylene glycol, prochlorperazine   I have personally reviewed following labs and imaging studies  LABORATORY DATA: CBC: Recent Labs  Lab 07/19/22 0533 07/20/22 0127 07/21/22 0201  WBC 13.6* 11.7* 11.2*  HGB 9.1* 8.1* 7.7*  HCT 28.0* 25.8* 24.0*  MCV 89.5 89.0 89.2  PLT 506* 446* 443*  Basic Metabolic Panel: Recent Labs  Lab 07/19/22 0533 07/20/22 0127 07/21/22 0201  NA 134* 134* 136  K 3.8 4.4 4.1  CL 102 102 107  CO2 22 18* 21*  GLUCOSE 114* 173* 107*  BUN 35* 41* 45*  CREATININE 2.68* 2.80* 2.73*  CALCIUM 8.1* 8.1* 8.1*     GFR: Estimated Creatinine Clearance: 30.1 mL/min (A) (by C-G formula based on SCr of 2.73 mg/dL (H)).  Liver Function Tests: No results for input(s): "AST", "ALT", "ALKPHOS", "BILITOT", "PROT", "ALBUMIN" in the last 168 hours. No results for input(s): "LIPASE", "AMYLASE" in the last 168 hours. No results for input(s): "AMMONIA" in the last 168 hours.  Coagulation Profile: Recent Labs  Lab 07/19/22 0635 07/20/22 0127 07/21/22 0201  INR 2.6* 3.4* 1.5*     Cardiac Enzymes: No results for input(s): "CKTOTAL", "CKMB", "CKMBINDEX", "TROPONINI" in the last 168 hours.  BNP (last 3 results) No results for input(s): "PROBNP" in the last 8760 hours.  Lipid Profile: No results for input(s): "CHOL", "HDL", "LDLCALC", "TRIG", "CHOLHDL", "LDLDIRECT" in the last 72 hours.  Thyroid Function Tests: No results for input(s): "TSH", "T4TOTAL", "FREET4", "T3FREE", "THYROIDAB" in the last 72  hours.  Anemia Panel: No results for input(s): "VITAMINB12", "FOLATE", "FERRITIN", "TIBC", "IRON", "RETICCTPCT" in the last 72 hours.  Urine analysis:    Component Value Date/Time   COLORURINE YELLOW 01/09/2022 0225   APPEARANCEUR CLEAR 01/09/2022 0225   LABSPEC 1.045 (H) 01/09/2022 0225   PHURINE 7.0 01/09/2022 0225   GLUCOSEU 50 (A) 01/09/2022 0225   HGBUR NEGATIVE 01/09/2022 0225   BILIRUBINUR NEGATIVE 01/09/2022 0225   KETONESUR NEGATIVE 01/09/2022 0225   PROTEINUR >=300 (A) 01/09/2022 0225   UROBILINOGEN 0.2 06/23/2014 1227   NITRITE NEGATIVE 01/09/2022 0225   LEUKOCYTESUR TRACE (A) 01/09/2022 0225    Sepsis Labs: Lactic Acid, Venous    Component Value Date/Time   LATICACIDVEN 0.7 07/19/2022 0918    MICROBIOLOGY: Recent Results (from the past 240 hour(s))  Blood culture (routine x 2)     Status: None (Preliminary result)   Collection Time: 07/19/22  9:14 AM   Specimen: BLOOD RIGHT FOREARM  Result Value Ref Range Status   Specimen Description BLOOD RIGHT FOREARM  Final   Special Requests   Final    BOTTLES DRAWN AEROBIC AND ANAEROBIC Blood Culture adequate volume   Culture   Final    NO GROWTH 2 DAYS Performed at [facility], [address], [place], [place] [address]    Report Status PENDING  Incomplete  Blood culture (routine x 2)     Status: None (Preliminary result)   Collection Time: 07/19/22  9:29 AM   Specimen: BLOOD RIGHT HAND  Result Value Ref Range Status   Specimen Description BLOOD RIGHT HAND  Final   Special Requests   Final    BOTTLES DRAWN AEROBIC AND ANAEROBIC Blood Culture adequate volume   Culture   Final    NO GROWTH 2 DAYS Performed at [facility], [place] [address], [place], [place] [address]    Report Status PENDING  Incomplete  Surgical PCR screen     Status: Abnormal   Collection Time: 07/19/22 11:00 PM   Specimen: Nasal Mucosa; Nasal Swab  Result Value Ref Range Status   MRSA, PCR POSITIVE (A) NEGATIVE Final     Comment: RESULT CALLED TO, READ BACK BY AND VERIFIED WITH: C ALLEN,RN@0059  07/20/22 [place]    Staphylococcus aureus POSITIVE (A) NEGATIVE Final    Comment: (NOTE) The  Xpert SA Assay (FDA approved for NASAL specimens in patients 51 years of age and older), is one component of a comprehensive surveillance program. It is not intended to diagnose infection nor to guide or monitor treatment. Performed at [facility], [place] [address] [place], [place] [address]     RADIOLOGY STUDIES/RESULTS: VAS [name] LOWER EXTREMITY ARTERIAL DUPLEX  Result Date: 07/20/2022 LOWER EXTREMITY ARTERIAL DUPLEX STUDY Patient Name:  LAUREEN KONARSKI  Date of Exam:   07/20/2022 Medical Rec #: [id number]        Accession #:    [id number] Date of Birth: 1981-01-03        Patient Gender: F Patient Age:   10 years Exam Location:  [facility] Procedure:      VAS [name] LOWER EXTREMITY ARTERIAL DUPLEX Referring Phys: [facility] --------------------------------------------------------------------------------  Indications: Peripheral artery disease, and Diabetic left foot ulcer. High Risk Factors: Diabetes, current smoker, coronary artery disease, prior CVA.  Vascular Interventions: 06/10/22 - Left anterior tibial and dorsalis pedis                         angioplasty with angioplasty and stenting of left mid                         SFA by Dr. Carlis Abbott. Current ABI:            Right 0.93, Left 0.88 Performing Technologist: Velva Harman Sturdivant RDMS, RVT  Examination Guidelines: A complete evaluation includes B-mode imaging, spectral Doppler, color Doppler, and power Doppler as needed of all accessible portions of each vessel. Bilateral testing is considered an integral part of a complete examination. Limited examinations for reoccurring indications may be performed as noted.   Right Stent(s): +---------------+---++----------++ Prox to Stent  83monophasic +---------------+---++----------++ Proximal Stent 44monophasic  +---------------+---++----------++ Mid Stent      60monophasic +---------------+---++----------++ Distal Stent   91monophasic +---------------+---++----------++ Distal to Stent73monophasic +---------------+---++----------++    +-----------+--------+-----+---------------+----------+------------------------+ LEFT       PSV cm/sRatioStenosis       Waveform  Comments                 +-----------+--------+-----+---------------+----------+------------------------+ CFA Mid    87                          monophasic                         +-----------+--------+-----+---------------+----------+------------------------+ CFA Distal 109                         monophasic                         +-----------+--------+-----+---------------+----------+------------------------+ DFA        90                          monophasic                         +-----------+--------+-----+---------------+----------+------------------------+ SFA Prox   135                                                            +-----------+--------+-----+---------------+----------+------------------------+  SFA Mid                                          Mid SFA Stent            +-----------+--------+-----+---------------+----------+------------------------+ SFA Distal 116                         monophasic                         +-----------+--------+-----+---------------+----------+------------------------+ POP Prox   151                         monophasic                         +-----------+--------+-----+---------------+----------+------------------------+ POP Distal 94                          monophasic                         +-----------+--------+-----+---------------+----------+------------------------+ TP Trunk   106                         monophasic                         +-----------+--------+-----+---------------+----------+------------------------+  ATA Prox   57                          monophasic                         +-----------+--------+-----+---------------+----------+------------------------+ ATA Mid    93                          monophasic                         +-----------+--------+-----+---------------+----------+------------------------+ ATA Distal 329          50-74% stenosismonophasic3.5 ratio                +-----------+--------+-----+---------------+----------+------------------------+ PTA Prox   27                          monophasicareas of occlusive                                                        disease with                                                              collaterization          +-----------+--------+-----+---------------+----------+------------------------+ PTA Mid    53  monophasic                         +-----------+--------+-----+---------------+----------+------------------------+ PTA Distal 58                          monophasic                         +-----------+--------+-----+---------------+----------+------------------------+ PERO Prox  70                          monophasic                         +-----------+--------+-----+---------------+----------+------------------------+ PERO Mid   64                          monophasic                         +-----------+--------+-----+---------------+----------+------------------------+ PERO Distal43                          monophasic                         +-----------+--------+-----+---------------+----------+------------------------+ DP         104                                                            +-----------+--------+-----+---------------+----------+------------------------+  Summary: Left: 50-74% stenosis noted in the anterior tibial artery. Patent left mid SFA stent.  See table(s) above for measurements and observations. Electronically  signed by Deitra Mayo MD on 07/20/2022 at 4:41:09 PM.    Final    VAS [name] ABI WITH/WO TBI  Result Date: 07/20/2022  LOWER EXTREMITY DOPPLER STUDY Patient Name:  RAKYAH PORTA  Date of Exam:   07/20/2022 Medical Rec #: [id number]        Accession #:    [id number] Date of Birth: 1981/04/14        Patient Gender: F Patient Age:   61 years Exam [facility]  [facility] Procedure:      VAS [name] ABI WITH/WO TBI Referring Phys: Anderson [facility] YATES --------------------------------------------------------------------------------  Indications: Peripheral artery disease. Left diabetic foot ulcer. High Risk Factors: Hyperlipidemia, Diabetes, current smoker, coronary artery                    disease.  Vascular Interventions: 06/10/22 - Left anterior tibial and dorsalis pedis                         angioplasty with angioplasty and stenting of left mid                         SFA by Dr. Carlis Abbott. Performing Technologist: Oda Cogan RDMS, RVT  Examination Guidelines: A complete evaluation includes at minimum, Doppler waveform signals and systolic blood pressure reading at the level of bilateral brachial, anterior tibial, and posterior tibial arteries, when vessel segments are accessible. Bilateral testing is considered an integral part of a  complete examination. Photoelectric Plethysmograph (PPG) waveforms and toe systolic pressure readings are included as required and additional duplex testing as needed. Limited examinations for reoccurring indications may be performed as noted.  ABI Findings: +---------+------------------+-----+---------+----------------+ Right    Rt Pressure (mmHg)IndexWaveform Comment          +---------+------------------+-----+---------+----------------+ Brachial 131                    triphasic                 +---------+------------------+-----+---------+----------------+ ATA      255               1.95 biphasic Non compressible  +---------+------------------+-----+---------+----------------+ PTA      123               0.94 biphasic                  +---------+------------------+-----+---------+----------------+ Great Toe88                0.67                           +---------+------------------+-----+---------+----------------+ +--------+------------------+-----+---------+----------------+ Left    Lt Pressure (mmHg)IndexWaveform Comment          +--------+------------------+-----+---------+----------------+ Brachial                       triphasic                 +--------+------------------+-----+---------+----------------+ PTA     115               0.88 biphasic                  +--------+------------------+-----+---------+----------------+ DP      213               1.63 biphasic Non compressible +--------+------------------+-----+---------+----------------+ +-------+-----------+-----------+------------+------------+ ABI/TBIToday's ABIToday's TBIPrevious ABIPrevious TBI +-------+-----------+-----------+------------+------------+ Right  0.94       0.67                                +-------+-----------+-----------+------------+------------+ Left   0.88       NA                                  +-------+-----------+-----------+------------+------------+  Left ABIs appear essentially unchanged compared to prior study on 01/14/22.  Summary: Right: Resting right ankle-brachial index indicates mild right lower extremity arterial disease. The right toe-brachial index is abnormal. Left: Resting left ankle-brachial index indicates mild left lower extremity arterial disease. *See table(s) above for measurements and observations.  Electronically signed by Deitra Mayo MD on 07/20/2022 at 4:40:44 PM.    Final      LOS: 2 days   Oren Binet, MD  [facility]    To contact the attending provider between 7A-7P or the covering provider during after hours 7P-7A, please log  into the web site [email or web address] and access using universal [place] password for that web site. If you do not have the password, please call the hospital operator.  07/21/2022, 11:53 AM

## 2022-07-21 NOTE — Progress Notes (Signed)
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 CO2 used during procedure prior to contrast being used, this may be cause as well. Recommended premedicating prior to contrast being used in the future.    Wellbutrin [Bupropion] Hives   Cefepime Rash    Tolerates penicilllin   Ciprofloxacin Hcl Hives and Rash    Hives/rash at injection site; 01/15/22 tolerated IV cipro   Tape Rash    Patient Measurements: Height: 5\' 5"  (165.1 cm) Weight: 88.5 kg (195 lb) IBW/kg (Calculated) : 57  Vital Signs: Temp: 97.7 F (36.5 C) (03/20 1800) Temp Source: Oral (03/20 1800) BP: 147/98 (03/20 1800) Pulse Rate: 90 (03/20 1800)  Labs: Recent Labs    07/19/22 0533 07/19/22 0635 07/19/22 0645 07/20/22 0127 07/21/22 0201  HGB 9.1*  --   --  8.1* 7.7*  HCT 28.0*  --   --  25.8* 24.0*  PLT 506*  --   --  446* 443*  LABPROT  --  27.8*  --  34.0* 18.4*  INR  --  2.6*  --  3.4* 1.5*  CREATININE 2.68*  --   --  2.80* 2.73*  TROPONINIHS 198*  --  187*  --   --     Estimated Creatinine Clearance: 30.1 mL/min (A) (by C-G formula based on SCr of 2.73 mg/dL (H)).   Medical History: Past Medical History:  Diagnosis Date   Anemia    CAD (coronary artery disease)    a. s/p cath in 03/2014 showing 30% mid-LAD, moderate to severe disease along small D1, patent LCx, moderate to severe distal OM2 stenosis and moderate diffuse diease along RCA not amenable to PCI   CHF (congestive heart failure) ([place])    a. EF 55-60% in 12/2019 b. EF at 35-40% by echo in 05/2020   CKD (chronic kidney disease) stage 4, GFR 15-29 ml/min (HCC)    Diabetes mellitus without complication (HCC)    Myocardial infarction ([place])     Neuropathy    PVD (peripheral vascular disease) ([place])    Stroke ([place]) 01/2022   L hand weakness    Medications:  Medications Prior to Admission  Medication Sig Dispense Refill Last Dose   acetaminophen (TYLENOL) 325 MG tablet Take 1-2 tablets (325-650 mg total) by mouth every 4 (four) hours as needed for mild pain. (Patient taking differently: Take 650 mg by mouth as needed for mild pain.) 100 tablet 0 Past Week   atorvastatin (LIPITOR) 40 MG tablet Take 1 tablet (40 mg total) by mouth daily. Courtesy fill/ pt to get established with a primary MD for further refills 90 tablet 1 Past Week   clopidogrel (PLAVIX) 75 MG tablet Take 1 tablet (75 mg total) by mouth daily. 30 tablet 11 07/18/2022 at 0800   diclofenac Sodium (VOLTAREN ARTHRITIS PAIN) 1 % GEL Apply 2 g topically 4 (four) times daily. (Patient taking differently: Apply 1 Application topically 2 (two) times daily as needed (pain).) 150 g 5 Past Week   furosemide (LASIX) 40 MG tablet Take 40 mg daily as needed for swelling 90 tablet 3 07/17/2022   hydrALAZINE (APRESOLINE) 25 MG tablet Take 0.5 tablets (12.5 mg total) by mouth 2 (two) times daily. 30 tablet 6 Past Week   ibuprofen (ADVIL) 200 MG  tablet Take 400 mg by mouth as needed for moderate pain or mild pain.   unknown   insulin aspart (NOVOLOG) 100 UNIT/ML injection Use with Omnipod for daily dose around 80 units daily 60 mL 3 07/19/2022   Insulin Disposable Pump (OMNIPOD 5 G6 PODS, GEN 5,) MISC Change pod every 48-72 hours 6 each 3 07/19/2022   isosorbide mononitrate (ISMO) 20 MG tablet Take 1 tablet (20 mg total) by mouth 2 (two) times daily. Courtesy fill/ pt to get established with a primary MD for further refills 180 tablet 1 Past Week   levothyroxine (SYNTHROID) 125 MCG tablet Take 1 tablet (125 mcg total) by mouth daily at 6 (six) AM. 90 tablet 3 07/18/2022   magnesium oxide (MAG-OX) 400 MG tablet Take 0.5 tablets (200 mg total) by mouth at bedtime. (Patient taking differently: Take  400 mg by mouth 3 (three) times a week.) 15 tablet 0 Past Week   Multiple Vitamin (MULTIVITAMIN WITH MINERALS) TABS tablet Take 1 tablet by mouth daily.   Past Week   nitroGLYCERIN (NITROSTAT) 0.4 MG SL tablet Place 1 tablet (0.4 mg total) under the tongue every 5 (five) minutes x 3 doses as needed for chest pain. 25 tablet 3 Past Month   ondansetron (ZOFRAN) 4 MG tablet Take 1 tablet (4 mg total) by mouth every 8 (eight) hours as needed for nausea or vomiting. 90 tablet 0 07/18/2022   oxyCODONE (OXY IR/ROXICODONE) 5 MG immediate release tablet Take 1 tablet (5 mg total) by mouth every 6 (six) hours as needed for severe pain. 20 tablet 0 07/18/2022   pantoprazole (PROTONIX) 40 MG tablet Take 1 tablet (40 mg total) by mouth daily. Courtesy fill/ pt to get established with a primary MD for further refills (Patient taking differently: Take 40 mg by mouth daily as needed (reflux).) 90 tablet 1 Past Week   polyethylene glycol (MIRALAX / GLYCOLAX) 17 g packet Take 17 g by mouth 2 (two) times daily. (Patient taking differently: Take 17 g by mouth as needed for moderate constipation.) 60 each 0 unknown   ranolazine (RANEXA) 500 MG 12 hr tablet Take 1 tablet (500 mg total) by mouth 2 (two) times daily. Courtesy fill/ pt to get established with a primary MD for further refills 60 tablet 2 Past Week   SANTYL 250 UNIT/GM ointment Apply 1 Application topically daily.   Past Week   tiZANidine (ZANAFLEX) 4 MG tablet Take 1 tablet (4 mg total) by mouth at bedtime. Courtesy fill/ pt to get established with a primary MD for further refills 30 tablet 2 Past Week   traZODone (DESYREL) 50 MG tablet Take 1 tablet (50 mg total) by mouth at bedtime. 30 tablet 2 Past Week   amoxicillin-clavulanate (AUGMENTIN) 875-125 MG tablet Take 1 tablet by mouth 2 (two) times daily. (Patient not taking: Reported on 07/20/2022) 20 tablet 0 Completed Course   Continuous Blood Gluc Sensor (DEXCOM G6 SENSOR) MISC Change sensor every 10 days as  directed 9 each 3    enoxaparin (LOVENOX) 150 MG/ML injection Inject 1 mL (150 mg total) into the skin daily for 10 days. (Patient not taking: Reported on 07/20/2022) 10 mL 0 Completed Course   Insulin Disposable Pump (OMNIPOD 5 G6 PODS, GEN 5,) MISC Change pod every 48-72 hours 6 each 3    methocarbamol (ROBAXIN) 500 MG tablet Take 1 tablet (500 mg total) by mouth every 6 (six) hours as needed for muscle spasms. (Patient not taking: Reported on 07/20/2022) 60 tablet 0 Not  Taking   metoprolol succinate (TOPROL XL) 50 MG 24 hr tablet Take 1 tablet (50 mg total) by mouth daily. 30 tablet 6 07/18/2022 at 0800   ramelteon (ROZEREM) 8 MG tablet Take 1 tablet (8 mg total) by mouth at bedtime. (Patient not taking: Reported on 07/20/2022) 30 tablet 0 Not Taking   Vitamin D, Ergocalciferol, (DRISDOL) 1.25 MG (50000 UNIT) CAPS capsule Take 1 capsule (50,000 Units total) by mouth every 7 (seven) days for 8 doses. (Patient not taking: Reported on 07/20/2022) 8 capsule 0 Completed Course   warfarin (COUMADIN) 5 MG tablet TAKE ONE TABLET BY MOUTH DAILY EXCEPT 1 AND 1/2 TABLET ON WEDNESDAYS AND SATURDAYS OR AS DIRECTED 40 tablet 5 07/18/2022 at 1900    Assessment: 42 yo F presents with L foot osteomyelitis on 07/19/22. Pt has a history of stroke w/ aterial thrombus while on apixaban therapy. Pt was transitioned to warfarin for this reason per hematology and neurology. INR on admission was therapeutic at 2.6 with last warfarin dose of 5mg  given on 3/17 @ 1900. INR trended up to 3.4 on 3/19 and Vitamin K 5mg  IV given at 11:38 on 3/19 to reverse INR for surgery. Pt underwent L foot 5th metatarsal excision and debridement with 4th metatarsal bone biopsy on 07/21/22. Pharmacy consulted to resume home warfarin and bridge with enoxaparin until INR therapeutic starting on 07/21/22.  INR 1.5  Hgb 7.7, Plt 443 - trending down gradually No bleeding noted after patient transferred to floor post-op.   Goal of Therapy:  INR  2-3 Monitor platelets by anticoagulation protocol: Yes   Plan:  Give warfarin 7.5mg  PO x1 tonight Start enoxaparin 90mg  [place] q24h until INR is therapeutic Monitor renal function and adjust enoxaparin dosing for renal function Monitor daily CBC, INR, and for s/sx of bleeding post-op F/u 4th metatarsal biopsy to assess for any signs of infection/osteomyelitis No further plans for surgical intervention at this time, per Dr. Rema Jasmine, PharmD, BCPS Clinical Pharmacist 07/21/2022 7:08 PM   Please refer to [facility] for pharmacy phone number

## 2022-07-21 NOTE — Anesthesia Preprocedure Evaluation (Addendum)
Anesthesia Evaluation  Patient identified by MRN, date of birth, ID band Patient awake    Reviewed: Allergy & Precautions, NPO status , Patient's Chart, lab work & pertinent test results, reviewed documented beta blocker date and time   Airway Mallampati: II  TM Distance: >3 FB Neck ROM: Full    Dental  (+) Teeth Intact, Dental Advisory Given   Pulmonary former smoker   Pulmonary exam normal breath sounds clear to auscultation       Cardiovascular hypertension, Pt. on home beta blockers + angina  + CAD, + Past MI, + Peripheral Vascular Disease and +CHF  Normal cardiovascular exam Rhythm:Regular Rate:Normal     Neuro/Psych  Neuromuscular disease CVA, Residual Symptoms  negative psych ROS   GI/Hepatic Neg liver ROS,GERD  Medicated,,  Endo/Other  diabetes, Insulin DependentHypothyroidism    Renal/GU Renal InsufficiencyRenal disease     Musculoskeletal OSTEOMYELITIS OF LEFT FOOT   Abdominal   Peds  Hematology  (+) Blood dyscrasia (Plavix), anemia   Anesthesia Other Findings Day of surgery medications reviewed with the patient.  Reproductive/Obstetrics                             Anesthesia Physical Anesthesia Plan  ASA: 4  Anesthesia Plan: MAC and Regional   Post-op Pain Management: Tylenol PO (pre-op)*, Ketamine IV* and Regional block*   Induction: Intravenous  PONV Risk Score and Plan: 2 and Dexamethasone, Ondansetron, Midazolam and TIVA  Airway Management Planned: Natural Airway and Simple Face Mask  Additional Equipment:   Intra-op Plan:   Post-operative Plan:   Informed Consent: I have reviewed the patients History and Physical, chart, labs and discussed the procedure including the risks, benefits and alternatives for the proposed anesthesia with the patient or authorized representative who has indicated his/her understanding and acceptance.     Dental advisory given  Plan  Discussed with: CRNA  Anesthesia Plan Comments:         Anesthesia Quick Evaluation

## 2022-07-21 NOTE — Op Note (Signed)
PATIENT:  Lauren [name]  42 y.o. female   PRE-OPERATIVE DIAGNOSIS:  OSTEOMYELITIS OF LEFT FOOT   POST-OPERATIVE DIAGNOSIS:  OSTEOMYELITIS OF LEFT FOOT   PROCEDURE:  Procedure(s): LEFT FOOT EXCISION OF FIFTH METATARSAL WITH DEBRIDEMENT OF ULCER, POSSIBLE BONE BIOPSY OF THE FOURTH METATARSAL, POSSIBLE FOURTH RAY AMPUTATION OF LEFT TOEAND METATARSAL (Left)   SURGEON:  Surgeon(s) and Role:    * Trula Slade, DPM - Primary   PHYSICIAN ASSISTANT:    ASSISTANTS: none    ANESTHESIA:   General   EBL:  20 mL    BLOOD ADMINISTERED:none   DRAINS: none    LOCAL MEDICATIONS USED:  NONE   SPECIMEN:  Source of Specimen:  5th metatarsal for path, 4th metatarsal for path/micro   DISPOSITION OF SPECIMEN:  PATHOLOGY   COUNTS:  YES   TOURNIQUET:  * No tourniquets in log *   DICTATION: .Dragon Dictation   PLAN OF CARE: Admit to inpatient    PATIENT DISPOSITION:  PACU - hemodynamically stable.   Delay start of Pharmacological VTE agent (>24hrs) due to surgical blood loss or risk of bleeding: no   Intraoperative findings: 5th metatarsal fragmented at the base. Peroneal tendon necrotic distally. 4th metatarsal appears viable but took bone culture/path. No fluid along the 4th TMT. Incision closed mostly but with packing centrally. Will plan for dressing change tomorrow. Await culture. NWB   Indications for surgery: Patient mated with worsening infection of the left foot of osteomyelitis at the left fifth metatarsal concern for infection proximal to the fourth metatarsal.  Given the infection we discussed surgical versus conservative treatment I recommend surgical intervention.  We discussed fifth ray resection, bone biopsy or possible resection of fourth ray and debridement the ulcer.  Alternatives risks and complications were discussed.  No promises or guarantees were given aspect of the procedure and all questions answered to the best my ability.    Procedure in detail: Patient both  verbally and visually identified by myself, nursing staff, anesthesia staff preoperatively.  She was then transferred to the operating room via stretcher and placed on the operative table in supine position.  After adequate plane of anesthesia was obtained the left lower extremities and scrubbed, prepped, draped in normal sterile fashion.  Timeout was performed.  Attention was first directed along the area of the ulceration in which the wound was excised with a #15 blade scalpel.  The wound prior to excision was 1.5 x 1.5 cm and was full-thickness down to bone.  I excised the entire wound and is 70 necrotic tissue present.  I extended the incision proximally and distally along the fifth metatarsal and along the peroneal tendon.  At this time soft tissue structures were freed from the fifth metatarsal.  Extremity fragmentation fifth metatarsal base plantarly and necrosis of the peroneal tendon along the insertion.  I was not able to see the peroneal tendon for later transfer given the infection.  It was not tracking up the tendon however.  I then remove the entire fifth metatarsal.  This was sent to pathology.  Evaluation of the fourth metatarsal revealed it was hard in nature and it is not soft and there is no purulence coming from the metatarsal or along the joint itself.  Therefore to proceed with biopsy to ensure no further infection.  Utilized a Jamshidi needle and I sent a piece of bone for pathology as well as microbiology.  After debrided soft tissues.  I copiously irrigated with saline.  I then closed  the wound with 3-0 nylon and along the central area of the wound was excised was loosely closed impacted with endoform packing.  I also did utilize Surgifoam in order to help with hemostasis as there was some oozing along the proximal aspect.  Xeroform was applied followed by dry sterile dressing.  Of note there was bleeding on the proximal aspect incision and mild bleeding distally.   She is welcome  anesthesia and found to try the procedure without any complications.  Transferred to PACU vital signs stable and vascular status intact.

## 2022-07-21 NOTE — Anesthesia Procedure Notes (Signed)
Anesthesia Regional Block: Adductor canal block   Pre-Anesthetic Checklist: , timeout performed,  Correct Patient, Correct Site, Correct Laterality,  Correct Procedure, Correct Position, site marked,  Risks and benefits discussed,  Surgical consent,  Pre-op evaluation,  At surgeon's request and post-op pain management  Laterality: Left  Prep: chloraprep       Needles:  Injection technique: Single-shot  Needle Type: Echogenic Needle     Needle Length: 9cm  Needle Gauge: 21     Additional Needles:   Procedures:,,,, ultrasound used (permanent image in chart),,    Narrative:  Start time: 07/21/2022 3:30 PM End time: 07/21/2022 3:35 PM Injection made incrementally with aspirations every 5 mL.  Performed by: Personally  Anesthesiologist: Santa Lighter, MD  Additional Notes: No pain on injection. No increased resistance to injection. Injection made in 5cc increments.  Good needle visualization.  Patient tolerated procedure well.

## 2022-07-22 ENCOUNTER — Ambulatory Visit: Payer: Self-pay | Admitting: *Deleted

## 2022-07-22 ENCOUNTER — Encounter (HOSPITAL_COMMUNITY): Payer: Self-pay | Admitting: Podiatry

## 2022-07-22 ENCOUNTER — Ambulatory Visit (HOSPITAL_COMMUNITY): Payer: BC Managed Care – PPO

## 2022-07-22 DIAGNOSIS — Z794 Long term (current) use of insulin: Secondary | ICD-10-CM

## 2022-07-22 DIAGNOSIS — I5020 Unspecified systolic (congestive) heart failure: Secondary | ICD-10-CM | POA: Diagnosis not present

## 2022-07-22 DIAGNOSIS — E1022 Type 1 diabetes mellitus with diabetic chronic kidney disease: Secondary | ICD-10-CM | POA: Diagnosis not present

## 2022-07-22 DIAGNOSIS — E782 Mixed hyperlipidemia: Secondary | ICD-10-CM | POA: Diagnosis not present

## 2022-07-22 DIAGNOSIS — E11628 Type 2 diabetes mellitus with other skin complications: Secondary | ICD-10-CM | POA: Diagnosis not present

## 2022-07-22 DIAGNOSIS — L089 Local infection of the skin and subcutaneous tissue, unspecified: Secondary | ICD-10-CM | POA: Diagnosis not present

## 2022-07-22 LAB — CBC
HCT: 27.8 % — ABNORMAL LOW (ref 36.0–46.0)
Hemoglobin: 9.3 g/dL — ABNORMAL LOW (ref 12.0–15.0)
MCH: 29.4 pg (ref 26.0–34.0)
MCHC: 33.5 g/dL (ref 30.0–36.0)
MCV: 88 fL (ref 80.0–100.0)
Platelets: 588 10*3/uL — ABNORMAL HIGH (ref 150–400)
RBC: 3.16 MIL/uL — ABNORMAL LOW (ref 3.87–5.11)
RDW: 13.8 % (ref 11.5–15.5)
WBC: 14.1 10*3/uL — ABNORMAL HIGH (ref 4.0–10.5)
nRBC: 0 % (ref 0.0–0.2)

## 2022-07-22 LAB — TROPONIN I (HIGH SENSITIVITY)
Troponin I (High Sensitivity): 1198 ng/L (ref <18)
Troponin I (High Sensitivity): 1199 ng/L (ref <18)
Troponin I (High Sensitivity): 1210 ng/L (ref <18)

## 2022-07-22 LAB — PROTIME-INR
INR: 1.2 (ref 0.8–1.2)
Prothrombin Time: 15.2 seconds (ref 11.4–15.2)

## 2022-07-22 LAB — BASIC METABOLIC PANEL
Anion gap: 11 (ref 5–15)
BUN: 46 mg/dL — ABNORMAL HIGH (ref 6–20)
CO2: 16 mmol/L — ABNORMAL LOW (ref 22–32)
Calcium: 8 mg/dL — ABNORMAL LOW (ref 8.9–10.3)
Chloride: 104 mmol/L (ref 98–111)
Creatinine, Ser: 2.74 mg/dL — ABNORMAL HIGH (ref 0.44–1.00)
GFR, Estimated: 22 mL/min — ABNORMAL LOW (ref >60)
Glucose, Bld: 463 mg/dL — ABNORMAL HIGH (ref 70–99)
Potassium: 5 mmol/L (ref 3.5–5.1)
Sodium: 131 mmol/L — ABNORMAL LOW (ref 135–145)

## 2022-07-22 LAB — GLUCOSE, CAPILLARY
Glucose-Capillary: 146 mg/dL — ABNORMAL HIGH (ref 70–99)
Glucose-Capillary: 263 mg/dL — ABNORMAL HIGH (ref 70–99)
Glucose-Capillary: 385 mg/dL — ABNORMAL HIGH (ref 70–99)
Glucose-Capillary: 401 mg/dL — ABNORMAL HIGH (ref 70–99)
Glucose-Capillary: 460 mg/dL — ABNORMAL HIGH (ref 70–99)
Glucose-Capillary: 58 mg/dL — ABNORMAL LOW (ref 70–99)

## 2022-07-22 MED ORDER — DOXYCYCLINE HYCLATE 100 MG PO TABS
100.0000 mg | ORAL_TABLET | Freq: Two times a day (BID) | ORAL | Status: DC
  Administered 2022-07-22 – 2022-07-27 (×11): 100 mg via ORAL
  Filled 2022-07-22 (×11): qty 1

## 2022-07-22 MED ORDER — ALUM & MAG HYDROXIDE-SIMETH 200-200-20 MG/5ML PO SUSP: 30.0000 mL | Freq: Four times a day (QID) | ORAL | Status: DC | PRN

## 2022-07-22 MED ORDER — INSULIN PUMP
Freq: Three times a day (TID) | SUBCUTANEOUS | Status: DC
  Filled 2022-07-22: qty 1

## 2022-07-22 MED ORDER — AMOXICILLIN-POT CLAVULANATE 500-125 MG PO TABS
1.0000 | ORAL_TABLET | Freq: Two times a day (BID) | ORAL | Status: DC
  Administered 2022-07-22 – 2022-07-27 (×11): 1 via ORAL
  Filled 2022-07-22 (×11): qty 1

## 2022-07-22 MED ORDER — INSULIN PUMP
Freq: Three times a day (TID) | SUBCUTANEOUS | Status: DC
  Administered 2022-07-22: 1.85 via SUBCUTANEOUS
  Administered 2022-07-22: 0.85 via SUBCUTANEOUS
  Administered 2022-07-23: 1.15 via SUBCUTANEOUS
  Administered 2022-07-23: 10.5 via SUBCUTANEOUS
  Administered 2022-07-23: 3 via SUBCUTANEOUS
  Filled 2022-07-22: qty 1

## 2022-07-22 MED ORDER — WARFARIN SODIUM 7.5 MG PO TABS
7.5000 mg | ORAL_TABLET | Freq: Once | ORAL | Status: AC
  Administered 2022-07-22: 7.5 mg via ORAL
  Filled 2022-07-22: qty 1

## 2022-07-22 MED ORDER — PANTOPRAZOLE SODIUM 40 MG PO TBEC
40.0000 mg | DELAYED_RELEASE_TABLET | Freq: Every day | ORAL | Status: DC
  Administered 2022-07-22 – 2022-07-27 (×6): 40 mg via ORAL
  Filled 2022-07-22 (×6): qty 1

## 2022-07-22 MED ORDER — INSULIN ASPART 100 UNIT/ML IJ SOLN
7.0000 [IU] | Freq: Once | INTRAMUSCULAR | Status: AC
  Administered 2022-07-22: 7 [IU] via SUBCUTANEOUS

## 2022-07-22 NOTE — Progress Notes (Addendum)
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 CO2 used during procedure prior to contrast being used, this may be cause as well. Recommended premedicating prior to contrast being used in the future.    Wellbutrin [Bupropion] Hives   Cefepime Rash    Tolerates penicilllin   Ciprofloxacin Hcl Hives and Rash    Hives/rash at injection site; 01/15/22 tolerated IV cipro   Tape Rash    Patient Measurements: Height: 5\' 5"  (165.1 cm) Weight: 88.5 kg (195 lb) IBW/kg (Calculated) : 57  Vital Signs: Temp: 98 F (36.7 C) (03/21 0456) BP: 142/80 (03/21 0456) Pulse Rate: 92 (03/21 0456)  Labs: Recent Labs    07/20/22 0127 07/21/22 0201 07/22/22 0232  HGB 8.1* 7.7* 9.3*  HCT 25.8* 24.0* 27.8*  PLT 446* 443* 588*  LABPROT 34.0* 18.4* 15.2  INR 3.4* 1.5* 1.2  CREATININE 2.80* 2.73* 2.74*     Estimated Creatinine Clearance: 30 mL/min (A) (by C-G formula based on SCr of 2.74 mg/dL (H)).   Medical History: Past Medical History:  Diagnosis Date   Anemia    CAD (coronary artery disease)    a. s/p cath in 03/2014 showing 30% mid-LAD, moderate to severe disease along small D1, patent LCx, moderate to severe distal OM2 stenosis and moderate diffuse diease along RCA not amenable to PCI   CHF (congestive heart failure) ([place])    a. EF 55-60% in 12/2019 b. EF at 35-40% by echo in 05/2020   CKD (chronic kidney disease) stage 4, GFR 15-29 ml/min (HCC)    Diabetes mellitus without complication (HCC)    Myocardial infarction ([place])    Neuropathy    PVD (peripheral vascular disease) ([place])    Stroke ([place]) 01/2022   L hand weakness    Medications:  Medications Prior to Admission  Medication  Sig Dispense Refill Last Dose   acetaminophen (TYLENOL) 325 MG tablet Take 1-2 tablets (325-650 mg total) by mouth every 4 (four) hours as needed for mild pain. (Patient taking differently: Take 650 mg by mouth as needed for mild pain.) 100 tablet 0 Past Week   atorvastatin (LIPITOR) 40 MG tablet Take 1 tablet (40 mg total) by mouth daily. Courtesy fill/ pt to get established with a primary MD for further refills 90 tablet 1 Past Week   clopidogrel (PLAVIX) 75 MG tablet Take 1 tablet (75 mg total) by mouth daily. 30 tablet 11 07/18/2022 at 0800   diclofenac Sodium (VOLTAREN ARTHRITIS PAIN) 1 % GEL Apply 2 g topically 4 (four) times daily. (Patient taking differently: Apply 1 Application topically 2 (two) times daily as needed (pain).) 150 g 5 Past Week   furosemide (LASIX) 40 MG tablet Take 40 mg daily as needed for swelling 90 tablet 3 07/17/2022   hydrALAZINE (APRESOLINE) 25 MG tablet Take 0.5 tablets (12.5 mg total) by mouth 2 (two) times daily. 30 tablet 6 Past Week   ibuprofen (ADVIL) 200 MG tablet Take 400 mg by mouth as needed for moderate pain or mild pain.   unknown   insulin aspart (NOVOLOG) 100 UNIT/ML injection Use with Omnipod for daily dose around 80 units daily 60 mL 3 07/19/2022   Insulin Disposable Pump (OMNIPOD 5 G6 PODS, GEN 5,) MISC Change pod every 48-72 hours 6  each 3 07/19/2022   isosorbide mononitrate (ISMO) 20 MG tablet Take 1 tablet (20 mg total) by mouth 2 (two) times daily. Courtesy fill/ pt to get established with a primary MD for further refills 180 tablet 1 Past Week   levothyroxine (SYNTHROID) 125 MCG tablet Take 1 tablet (125 mcg total) by mouth daily at 6 (six) AM. 90 tablet 3 07/18/2022   magnesium oxide (MAG-OX) 400 MG tablet Take 0.5 tablets (200 mg total) by mouth at bedtime. (Patient taking differently: Take 400 mg by mouth 3 (three) times a week.) 15 tablet 0 Past Week   Multiple Vitamin (MULTIVITAMIN WITH MINERALS) TABS tablet Take 1 tablet by mouth daily.   Past Week    nitroGLYCERIN (NITROSTAT) 0.4 MG SL tablet Place 1 tablet (0.4 mg total) under the tongue every 5 (five) minutes x 3 doses as needed for chest pain. 25 tablet 3 Past Month   ondansetron (ZOFRAN) 4 MG tablet Take 1 tablet (4 mg total) by mouth every 8 (eight) hours as needed for nausea or vomiting. 90 tablet 0 07/18/2022   oxyCODONE (OXY IR/ROXICODONE) 5 MG immediate release tablet Take 1 tablet (5 mg total) by mouth every 6 (six) hours as needed for severe pain. 20 tablet 0 07/18/2022   pantoprazole (PROTONIX) 40 MG tablet Take 1 tablet (40 mg total) by mouth daily. Courtesy fill/ pt to get established with a primary MD for further refills (Patient taking differently: Take 40 mg by mouth daily as needed (reflux).) 90 tablet 1 Past Week   polyethylene glycol (MIRALAX / GLYCOLAX) 17 g packet Take 17 g by mouth 2 (two) times daily. (Patient taking differently: Take 17 g by mouth as needed for moderate constipation.) 60 each 0 unknown   ranolazine (RANEXA) 500 MG 12 hr tablet Take 1 tablet (500 mg total) by mouth 2 (two) times daily. Courtesy fill/ pt to get established with a primary MD for further refills 60 tablet 2 Past Week   SANTYL 250 UNIT/GM ointment Apply 1 Application topically daily.   Past Week   tiZANidine (ZANAFLEX) 4 MG tablet Take 1 tablet (4 mg total) by mouth at bedtime. Courtesy fill/ pt to get established with a primary MD for further refills 30 tablet 2 Past Week   traZODone (DESYREL) 50 MG tablet Take 1 tablet (50 mg total) by mouth at bedtime. 30 tablet 2 Past Week   amoxicillin-clavulanate (AUGMENTIN) 875-125 MG tablet Take 1 tablet by mouth 2 (two) times daily. (Patient not taking: Reported on 07/20/2022) 20 tablet 0 Completed Course   Continuous Blood Gluc Sensor (DEXCOM G6 SENSOR) MISC Change sensor every 10 days as directed 9 each 3    enoxaparin (LOVENOX) 150 MG/ML injection Inject 1 mL (150 mg total) into the skin daily for 10 days. (Patient not taking: Reported on 07/20/2022) 10  mL 0 Completed Course   Insulin Disposable Pump (OMNIPOD 5 G6 PODS, GEN 5,) MISC Change pod every 48-72 hours 6 each 3    methocarbamol (ROBAXIN) 500 MG tablet Take 1 tablet (500 mg total) by mouth every 6 (six) hours as needed for muscle spasms. (Patient not taking: Reported on 07/20/2022) 60 tablet 0 Not Taking   metoprolol succinate (TOPROL XL) 50 MG 24 hr tablet Take 1 tablet (50 mg total) by mouth daily. 30 tablet 6 07/18/2022 at 0800   ramelteon (ROZEREM) 8 MG tablet Take 1 tablet (8 mg total) by mouth at bedtime. (Patient not taking: Reported on 07/20/2022) 30 tablet 0 Not Taking  Vitamin D, Ergocalciferol, (DRISDOL) 1.25 MG (50000 UNIT) CAPS capsule Take 1 capsule (50,000 Units total) by mouth every 7 (seven) days for 8 doses. (Patient not taking: Reported on 07/20/2022) 8 capsule 0 Completed Course   warfarin (COUMADIN) 5 MG tablet TAKE ONE TABLET BY MOUTH DAILY EXCEPT 1 AND 1/2 TABLET ON WEDNESDAYS AND SATURDAYS OR AS DIRECTED (Patient taking differently: Take 5-7.5 mg by mouth every evening. TAKE ONE TABLET BY MOUTH DAILY EXCEPT 1 AND 1/2 TABLET ON WEDNESDAYS AND SATURDAYS OR AS DIRECTED) 40 tablet 5 07/18/2022 at 1900    Assessment: 42 yo F presents with L foot osteomyelitis on 07/19/22. Pt has a history of stroke w/ aterial thrombus while on apixaban therapy. Pt was transitioned to warfarin for this reason per hematology and neurology. INR on admission was therapeutic at 2.6 with last warfarin dose of 5mg  given on 3/17 @ 1900. INR trended up to 3.4 on 3/19 and Vitamin K 5mg  IV given at 11:38 on 3/19 to reverse INR for surgery. Pt underwent L foot 5th metatarsal excision and debridement with 4th metatarsal bone biopsy on 07/21/22. Pharmacy consulted to resume home warfarin and bridge with enoxaparin until INR therapeutic starting on 07/21/22. No further plans for surgical intervention at this time, per Dr. Jacqualyn Posey.  PTA warfarin regimen: warfarin 5mg  daily except for 7.5mg  on Wed and Sat  INR 1.2,  Hgb 9.3, plts 588. CrCl borderline for dose adjustment at 30 mL/min. Given that renal function tends to fluctuate will keep enoxaparin at q24h dosing for now and watch Scr closely.   Goal of Therapy:  INR 2-3 Monitor platelets by anticoagulation protocol: Yes   Plan:  Give warfarin 7.5mg  PO x1 tonight Continue enoxaparin 90mg  [place] q24h until INR is >/= 2 Monitor renal function and adjust enoxaparin dosing for renal function Monitor daily CBC, INR, and for s/sx of bleeding post-op  Dimple Nanas, PharmD, BCPS 07/22/2022 7:51 AM

## 2022-07-22 NOTE — Anesthesia Postprocedure Evaluation (Signed)
Anesthesia Post Note  Patient: Lauren [name]  Procedure(s) Performed: LEFT FOOT EXCISION OF FIFTH METATARSAL WITH DEBRIDEMENT OF ULCER, POSSIBLE BONE BIOPSY OF THE FOURTH METATARSAL, POSSIBLE FOURTH RAY AMPUTATION OF LEFT TOEAND METATARSAL (Left: Foot)     Patient location during evaluation: PACU Anesthesia Type: Regional and MAC Level of consciousness: awake and alert Pain management: pain level controlled Vital Signs Assessment: post-procedure vital signs reviewed and stable Respiratory status: spontaneous breathing, nonlabored ventilation, respiratory function stable and patient connected to nasal cannula oxygen Cardiovascular status: stable and blood pressure returned to baseline Postop Assessment: no apparent nausea or vomiting Anesthetic complications: no   No notable events documented.  Last Vitals:  Vitals:   07/22/22 0456 07/22/22 0750  BP: (!) 142/80 (!) 157/92  Pulse: 92 92  Resp: 17 18  Temp: 36.7 C 36.9 C  SpO2: 100% 100%    Last Pain:  Vitals:   07/22/22 0842  TempSrc:   PainSc: 4                  Lauren [name] P Analeese [place]reat[place]

## 2022-07-22 NOTE — Plan of Care (Signed)
  Problem: Education: Goal: Knowledge of General Education information will improve Description: Including pain rating scale, medication(s)/side effects and non-pharmacologic comfort measures Outcome: Not Progressing   Problem: Health Behavior/Discharge Planning: Goal: Ability to manage health-related needs will improve Outcome: Not Progressing   Problem: Clinical Measurements: Goal: Ability to maintain clinical measurements within normal limits will improve Outcome: Not Progressing Goal: Will remain free from infection Outcome: Not Progressing Goal: Diagnostic test results will improve Outcome: Not Progressing Goal: Respiratory complications will improve Outcome: Not Progressing Goal: Cardiovascular complication will be avoided Outcome: Not Progressing   Problem: Activity: Goal: Risk for activity intolerance will decrease Outcome: Not Progressing   Problem: Nutrition: Goal: Adequate nutrition will be maintained Outcome: Not Progressing   Problem: Coping: Goal: Level of anxiety will decrease Outcome: Not Progressing   Problem: Elimination: Goal: Will not experience complications related to bowel motility Outcome: Not Progressing Goal: Will not experience complications related to urinary retention Outcome: Not Progressing   Problem: Pain Managment: Goal: General experience of comfort will improve Outcome: Not Progressing   

## 2022-07-22 NOTE — Progress Notes (Signed)
Patient c/o of being very thirsty. RN checked blood sugar and was 460. TR[facility] oncall notified, placed order to give 7 units insulin and check CBG after 2-3 hrs.

## 2022-07-22 NOTE — Inpatient Diabetes Management (Signed)
Inpatient Diabetes Program Recommendations  AACE/ADA: New Consensus Statement on Inpatient Glycemic Control (2015)  Target Ranges:  Prepandial:   less than 140 mg/dL      Peak postprandial:   less than 180 mg/dL (1-2 hours)      Critically ill patients:  140 - 180 mg/dL   Lab Results  Component Value Date   GLUCAP 385 (H) 07/22/2022   HGBA1C 7.0 (H) 07/19/2022    Review of Glycemic Control  Latest Reference Range & Units 07/21/22 17:12 07/21/22 17:56 07/21/22 19:38 07/22/22 04:08 07/22/22 07:32  Glucose-Capillary 70 - 99 mg/dL 152 (H) 129 (H) 209 (H) 460 (H) 385 (H)  (H): Data is abnormally high  Diabetes history: Type 1 DM Outpatient Diabetes medications:  Omnipod- Goal blood sugar 120 mg/dL , Carbohydrate ratio 1 unit/8 grams of CHO, Active insulin time: 3 hours Per Whitney with [facility]-last settings on 01/10/22 Basal rate 12a-12p = 0.9 units/hr--total 24 hr basal 21.6 units  Current orders for Inpatient glycemic control: Novolog 0-9 units TID & HS, Novolog 5 units TID, Semglee 28 units QD  Patient would like to place her insulin pump back on.  CBG's are elevated likely due to decadron 5 mg yesterday.  Last had basal SQ yesterday at 18:09.  Given elevated CBG's she could place pump on now though it has not been 24 hrs.  Family is going to bring pump and supplies to patient.  Please order insulin pump therapy order set.    Will continue to follow while inpatient.  Thank you, Reche Dixon, MSN, [place] Diabetes Coordinator Inpatient Diabetes Program [phone] (team pager from 8a-5p)

## 2022-07-22 NOTE — Patient Outreach (Signed)
Care Coordination   Follow Up Visit Note   07/22/2022  Name: Lauren [name] MRN: [id number] DOB: [birth year]  Lauren [name] is a 42 y.o. year old female who sees [name], [name], MD for primary care. I spoke with [name] by phone today.  What matters to the patients health and wellness today?   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 (pt-stated)   On track     Care Coordination Interventions:  Interventions Today    Flowsheet Row Most Recent Value  Chronic Disease   Chronic disease during today's visit Other  [History of CVA, Tobacco Abuse & Class 2 Morbid Obesity]  General Interventions   General Interventions Discussed/Reviewed General Interventions Discussed, Labs, Annual Foot Exam, General Interventions Reviewed, Annual Eye Exam, Durable Medical Equipment (DME), Community Resources, Level of Care, Communication with, Doctor Visits, Vaccines, Health Screening  [Communication with Primary Care Provider]  Labs Hgb A1c every 3 months, Kidney Function  [Encouraged]  Vaccines COVID-19, Flu, Pneumonia, RSV, Shingles, Tetanus/Pertussis/Diphtheria  [Encouraged]  Doctor Visits Discussed/Reviewed Doctor Visits Discussed, Doctor Visits Reviewed, Annual Wellness Visits, PCP, Specialist  [Encouraged]  Health Screening Bone Density, Colonoscopy, Mammogram  [Encouraged]  Durable Medical Equipment (DME) BP Cuff, Glucomoter, Oxygen, Walker, Community education officer  PCP/Specialist Visits Compliance with follow-up visit  [Encouraged]  Communication with PCP/Specialists, RN  [Encouraged]  Level of Care Adult Daycare, Applications, Assisted Living, [place]  Applications Medicaid, Personal Care Services, [place]-2  Exercise  Interventions   Exercise Discussed/Reviewed Exercise Discussed, Assistive device use and maintanence, Exercise Reviewed, Physical Activity  [Encouraged]  Physical Activity Discussed/Reviewed Physical Activity Discussed, Physical Activity Reviewed, Home Exercise Program (HEP), Types of exercise  [Encouraged]  Education Interventions   Education Provided Provided Engineer, site, Provided Web-based Education, Provided Education  Provided Verbal Education On Nutrition, Foot Care, Eye Care, Blood Sugar Monitoring, Medication, Personal assistant, [employer], Exercise, Applications, Mental Health/Coping with Illness, When to see the doctor  Applications Medicaid, Personal Care Services, [place] Discussed, Mental Health Reviewed, Coping Strategies, Crisis, Suicide, Substance Abuse, Grief and Loss, Depression, Anxiety  Nutrition Interventions   Nutrition Discussed/Reviewed Nutrition Discussed, Adding fruits and vegetables, Increaing proteins, Decreasing fats, Decreasing salt, Fluid intake, Nutrition Reviewed, Carbohydrate meal planning, Portion sizes, Decreasing sugar intake  [Encouraged]  Pharmacy Interventions   Pharmacy Dicussed/Reviewed Pharmacy Topics Discussed, Pharmacy Topics Reviewed, Medication Adherence, Affording Medications  [Encouraged]  Safety Interventions   Safety Discussed/Reviewed Safety Discussed, Safety Reviewed, Fall Risk, Home Safety  [Encouraged]  Home Safety Assistive Devices, Need for home safety assessment, Refer for home visit, Refer for community resources, Contact provider for referral to PT/OT  Advanced Directive Interventions   Advanced Directives Discussed/Reviewed Advanced Directives Discussed, Advanced Directives Reviewed, Advanced Care Planning  [Do Not Resusitate]       Assessed Social Determinant of Health Barriers. Discussed Plans for Ongoing Care Management Follow Up. Provided  Tree surgeon Information for Care Management Team Members. Screened for Signs & Symptoms of Depression, Related to Chronic Disease State.  PHQ2 & PHQ9 Depression Screen Completed & Results Reviewed.  Suicidal Ideation & Homicidal Ideation Assessed - None Present.   Domestic Violence Assessed - None Present. Access to  Weapons Assessed - None Present.   Active Listening & Reflection Utilized.  Verbalization of Feelings Encouraged.  Emotional Support Provided. Feelings of Caregiver Burnout Validated. Caregiver Stress Acknowledged. Caregiver Resources Reviewed. Caregiver Support Groups Emailed. Self-Enrollment in Caregiver Support Group of Interest Emphasized. Crisis Support Information, Agencies, [place] Emailed. Problem Solving Interventions Identified. Task-Centered Solutions Implemented.   Solution-Focused Strategies Developed. Acceptance & Commitment Therapy Introduced. Brief Cognitive Behavioral Therapy Initiated. Client-Centered Therapy Enacted. Reviewed Prescription Medications & Discussed Importance of Compliance. Quality of Sleep Assessed & Sleep Hygiene Techniques Promoted. Emphasized Importance of Patient Quitting Smoking & Offered Smoking Cessation Classes, Services, Agencies & Resources. Discussed Higher Level of Care Options (I.e. [place]) & Encouraged Consideration. Verified No In-Home Care Services, [employer], Building control surveyor, Etc., Covered Under Teaching laboratory technician through [employer].  Reviewed Designer, television/film set through [facility] & Encouraged Completion of Medicaid Application, through [facility] [place] [phone]). Verified No Long-Term Care Insurance Benefits, Secondary Insurance Policies, Plans, Coverage, Etc.  Confirmed Patient, Lauren [name],  Were Veterans, Making Patient Ineligible to Apply for Aid & Attendance Benefits, Through [employer]. Agreed to Assist with Completion of Medicaid Application & Submission to [facility] [place] [phone]), for Processing. Please Review the Following List of [employer], [employer], Medical laboratory scientific officer, Emailed on 07/22/2022: ~ Adult Day Care Programs  ~ [place] ~ [place] ~ 2023 Medicaid Tips ~ [employer] Application ~ [place] ~ [place] Providers Encouraged Tree surgeon with [employer], [place], in An Effort to [place] in [place]. Encouraged Completion of Application for [place], through [facility] [phone]), If Approved for Medicaid, through The [place] [phone]). Encouraged Tree surgeon with CSW (# [phone]), if You Have Questions, Need Assistance, or If Additional Social Work Needs Are Identified Between Now & Our Next Scheduled Follow-Up Outreach Call.      SDOH assessments and interventions completed:  Yes.  Care Coordination Interventions:  Yes, provided.   Follow up plan: Follow up call scheduled for 08/06/2022 at 12:00 pm.  Encounter Outcome:  Pt. Visit Completed.   Nat Christen, BSW, MSW, LCSW  Licensed Education officer, [facility]  Mailing [place] [address] [place], [place] [address] Physical Address-[address] [place], [place] [address] Toll Free Main # [phone] Fax # [phone] Cell # [phone] [email or web address]

## 2022-07-22 NOTE — Progress Notes (Signed)
   PODIATRY PROGRESS NOTE  NAME Kenyada Blizard MRN [id number] DOB 05-22-1980 DOA 07/19/2022   Reason for consult:  Chief Complaint  Patient presents with   Chest Pain     History of present illness: 42 y.o. female POD #1 s/p left 5th ray resection and bone biopsy of the 4th metatarsal, wound excision.  She does not have any pain to her foot.  Denies any fevers or chills.  Vitals:   07/22/22 1426 07/22/22 2028  BP: (!) 145/89 135/74  Pulse: 85 81  Resp: 17 16  Temp: 97.7 F (36.5 C) 97.9 F (36.6 C)  SpO2: 100% 99%       Latest Ref Rng & Units 07/22/2022    2:32 AM 07/21/2022    2:01 AM 07/20/2022    1:27 AM  CBC  WBC 4.0 - 10.5 K/uL 14.1  11.2  11.7   Hemoglobin 12.0 - 15.0 g/dL 9.3  7.7  8.1   Hematocrit 36.0 - 46.0 % 27.8  24.0  25.8   Platelets 150 - 400 K/uL 588  443  446        Latest Ref Rng & Units 07/22/2022    2:32 AM 07/21/2022    2:01 AM 07/20/2022    1:27 AM  BMP  Glucose 70 - 99 mg/dL 463  107  173   BUN 6 - 20 mg/dL 46  45  41   Creatinine 0.44 - 1.00 mg/dL 2.74  2.73  2.80   Sodium 135 - 145 mmol/L 131  136  134   Potassium 3.5 - 5.1 mmol/L 5.0  4.1  4.4   Chloride 98 - 111 mmol/L 104  107  102   CO2 22 - 32 mmol/L 16  21  18    Calcium 8.9 - 10.3 mg/dL 8.0  8.1  8.1       Physical Exam: General: AAOx3, NAD  Dermatology: The lateral aspect of close coapted with sutures intact.  The central aspect of the packing was that was removed in late bloody drainage expressed there is no frank purulence.  There is decreased edema there is mild surrounding erythema but no ascending cellulitis.  No fluctuance or crepitation but there is no malodor.    Vascular: Foot is warm and perfused  Neurological: Sensation decreased  Musculoskeletal Exam: No pain on exam    ASSESSMENT/PLAN OF CARE  POD # 1/p left foot fifth ray resection, bone biopsy fourth metatarsal with wound excision  -Dressing changed today.  Betadine applied to the incision, Xeroform and  dry sterile dressing. -Nonweightbearing left lower extremity -Culture 07/22/2022- NGTD -Pathology 07/22/2022- pending -Appreciate ID input -From podiatry standpoint to be discharged tomorrow with oral antibiotics with close follow-up next week in the office.  Continue nonweightbearing.  Will plan on dressing change prior to discharge either myself or with nursing staff prior to discharge.     Please contact me directly with any questions or concerns.     Celesta Gentile, DPM [facility]  Dr. Bonna Gains. [address], [address], [place] [address]                Office [phone]  Fax [phone]

## 2022-07-22 NOTE — Progress Notes (Addendum)
PROGRESS NOTE        PATIENT DETAILS Name: Lauren [name] Age: 42 y.o. Sex: female Date of Birth: 11-16-1980 Admit Date: 07/19/2022 Admitting Physician Karmen Bongo, MD [id number], [name], MD  Brief Summary: Patient is a 42 y.o.  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   Significant events: 3/18>> admit to [facility]  Significant studies: 3/18>> MRI left foot: Acute osteomyelitis of residual fifth metatarsal 3/18>> CXR: No PNA  Significant microbiology data: 3/18>> blood culture: No growth  Procedures: 3/20>> left fifth metatarsal excision, bone biopsy of fourth metatarsal.  Consults: Podiatry Vascular surgery  Subjective: No major issues overnight-she wants to go back on her insulin pump.  She is asking a family member to get it.  Objective: Vitals: Blood pressure 131/79, pulse 81, temperature 98.4 F (36.9 C), temperature source Oral, resp. rate 18, height 5\' 5"  (1.651 m), weight 88.5 kg, last menstrual period 07/18/2022, SpO2 100 %.   Exam: Gen Exam:Alert awake-not in any distress HEENT:atraumatic, normocephalic Chest: B/L clear to auscultation anteriorly CVS:S1S2 regular Abdomen:soft non tender, non distended Extremities:no edema-left foot in bandage-did not open. Neurology: Non focal Skin: no rash  Pertinent Labs/Radiology:    Latest Ref Rng & Units 07/22/2022    2:32 AM 07/21/2022    2:01 AM 07/20/2022    1:27 AM  CBC  WBC 4.0 - 10.5 K/uL 14.1  11.2  11.7   Hemoglobin 12.0 - 15.0 g/dL 9.3  7.7  8.1   Hematocrit 36.0 - 46.0 % 27.8  24.0  25.8   Platelets 150 - 400 K/uL 588  443  446     Lab Results  Component Value Date   NA 131 (L)  07/22/2022   K 5.0 07/22/2022   CL 104 07/22/2022   CO2 16 (L) 07/22/2022     Assessment/Plan: Left diabetic foot with Acute osteomyelitis of fifth metatarsal-s/p excision of left fifth metatarsal  and bone biopsy of fourth metatarsal on 3/20 Continue IV antibiotics Biopsy result Await further recommendations from podiatry ID consult today.  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 Continue antiplatelet/statin Coumadin held preoperatively-subsequently resumed on 3/20-along with Lovenox bridge. Case discussed with primary hematology-Dr Iruku-on 3/19-since neurology did not feel patient needed anticoagulation-patient has been arranged for a second hematology opinion at another institution.  Will plan to resume anticoagulation postoperatively once we are able-and continue with anticoagulation until she gets a second opinion.    CKD stage IV Close to baseline Follow electrolytes Avoid nephrotoxic agents  Normocytic anemia Baseline anemia due to CKD-likely worsened due to acute illness No evidence of blood loss Follow-and transfuse if Hb <7  DM-2 (A1C 7.0 on 3/18) with uncontrolled hyperglycemia Significantly elevated CBGs today-patient wants to go back on her insulin pump-claims that basal/bolus regimen does not work for her. Will place back on  insulin pump once family gets her-diabetic coordinator evaluation.    Recent Labs    07/21/22 1938 07/22/22 0408 07/22/22 0732  GLUCAP 209* 460* 385*      HLD Statin  Hypothyroidism Synthroid  Obesity: Estimated body mass index is 32.45 kg/m as calculated from the following:   Height as of this encounter: 5\' 5"  (1.651 m).   Weight as of this encounter: 88.5 kg.   Code status:   Code Status: Full Code (3/20>> wanted to revoke DNR-full code order entered)  DVT Prophylaxis: warfarin (COUMADIN) tablet 7.5 mg    Family Communication: None at bedside   Disposition Plan: Status is: Inpatient Remains inpatient appropriate because: Severity of illness   Planned Discharge Destination:Home health   Diet: Diet Order             Diet heart healthy/carb modified Room service appropriate? Yes; Fluid consistency: Thin  Diet effective now                     Antimicrobial agents: Anti-infectives (From admission, onward)    Start     Dose/Rate Route Frequency Ordered Stop   07/21/22 1000  vancomycin (VANCOCIN) IVPB 1000 mg/200 mL premix        1,000 mg 200 mL/hr over 60 Minutes Intravenous Every 48 hours 07/19/22 1003     07/19/22 1415  metroNIDAZOLE (FLAGYL) IVPB 500 mg        500 mg 100 mL/hr over 60 Minutes Intravenous Every 12 hours 07/19/22 1413     07/19/22 1015  vancomycin (VANCOREADY) IVPB 2000 mg/400 mL        2,000 mg 200 mL/hr over 120 Minutes Intravenous  Once 07/19/22 1003 07/19/22 1321   07/19/22 1015  ceFEPIme (MAXIPIME) 2 g in sodium chloride 0.9 % 100 mL IVPB        2 g 200 mL/hr over 30 Minutes Intravenous Every 12 hours 07/19/22 1003          MEDICATIONS: Scheduled Meds:  ascorbic acid  500 mg Oral Daily   atorvastatin  40 mg Oral Daily   Chlorhexidine Gluconate Cloth  6 each Topical Q0600   clopidogrel  75 mg Oral Daily   docusate sodium  100 mg Oral BID   enoxaparin (LOVENOX) injection  90 mg Subcutaneous Q24H   hydrALAZINE  12.5 mg Oral BID   insulin aspart  0-5 Units Subcutaneous QHS   insulin aspart  0-9 Units Subcutaneous TID WC   insulin aspart  8 Units Subcutaneous TID WC   insulin  glargine-yfgn  28 Units Subcutaneous Daily   insulin pump   Subcutaneous TID WC, HS, 0200   isosorbide mononitrate  20 mg Oral BID   levothyroxine  125 mcg Oral Q0600   magnesium oxide  400 mg Oral Daily   metoprolol succinate  50 mg Oral Daily   multivitamin with minerals  1 tablet Oral Daily   mupirocin ointment  1 Application Nasal BID   ranolazine  500 mg Oral BID   tiZANidine  4 mg Oral QHS   traZODone  50 mg Oral QHS   warfarin  7.5 mg Oral ONCE-1600   Warfarin - Pharmacist Dosing Inpatient   Does not apply q1600   zinc sulfate  220 mg Oral Daily   Continuous Infusions:  ceFEPime (MAXIPIME) IV 2 g (07/21/22 2218)   metronidazole 500 mg (07/22/22 0338)   vancomycin 1,000 mg (07/21/22 1012)   PRN Meds:.acetaminophen **OR** acetaminophen, alum & mag hydroxide-simeth, bisacodyl, hydrALAZINE,  ondansetron **OR** ondansetron (ZOFRAN) IV, oxyCODONE, polyethylene glycol, prochlorperazine   I have personally reviewed following labs and imaging studies  LABORATORY DATA: CBC: Recent Labs  Lab 07/19/22 0533 07/20/22 0127 07/21/22 0201 07/22/22 0232  WBC 13.6* 11.7* 11.2* 14.1*  HGB 9.1* 8.1* 7.7* 9.3*  HCT 28.0* 25.8* 24.0* 27.8*  MCV 89.5 89.0 89.2 88.0  PLT 506* 446* 443* 588*     Basic Metabolic Panel: Recent Labs  Lab 07/19/22 0533 07/20/22 0127 07/21/22 0201 07/22/22 0232  NA 134* 134* 136 131*  K 3.8 4.4 4.1 5.0  CL 102 102 107 104  CO2 22 18* 21* 16*  GLUCOSE 114* 173* 107* 463*  BUN 35* 41* 45* 46*  CREATININE 2.68* 2.80* 2.73* 2.74*  CALCIUM 8.1* 8.1* 8.1* 8.0*     GFR: Estimated Creatinine Clearance: 30 mL/min (A) (by C-G formula based on SCr of 2.74 mg/dL (H)).  Liver Function Tests: No results for input(s): "AST", "ALT", "ALKPHOS", "BILITOT", "PROT", "ALBUMIN" in the last 168 hours. No results for input(s): "LIPASE", "AMYLASE" in the last 168 hours. No results for input(s): "AMMONIA" in the last 168 hours.  Coagulation Profile: Recent Labs   Lab 07/19/22 0635 07/20/22 0127 07/21/22 0201 07/22/22 0232  INR 2.6* 3.4* 1.5* 1.2     Cardiac Enzymes: No results for input(s): "CKTOTAL", "CKMB", "CKMBINDEX", "TROPONINI" in the last 168 hours.  BNP (last 3 results) No results for input(s): "PROBNP" in the last 8760 hours.  Lipid Profile: No results for input(s): "CHOL", "HDL", "LDLCALC", "TRIG", "CHOLHDL", "LDLDIRECT" in the last 72 hours.  Thyroid Function Tests: No results for input(s): "TSH", "T4TOTAL", "FREET4", "T3FREE", "THYROIDAB" in the last 72 hours.  Anemia Panel: No results for input(s): "VITAMINB12", "FOLATE", "FERRITIN", "TIBC", "IRON", "RETICCTPCT" in the last 72 hours.  Urine analysis:    Component Value Date/Time   COLORURINE YELLOW 01/09/2022 0225   APPEARANCEUR CLEAR 01/09/2022 0225   LABSPEC 1.045 (H) 01/09/2022 0225   PHURINE 7.0 01/09/2022 0225   GLUCOSEU 50 (A) 01/09/2022 0225   HGBUR NEGATIVE 01/09/2022 0225   BILIRUBINUR NEGATIVE 01/09/2022 0225   KETONESUR NEGATIVE 01/09/2022 0225   PROTEINUR >=300 (A) 01/09/2022 0225   UROBILINOGEN 0.2 06/23/2014 1227   NITRITE NEGATIVE 01/09/2022 0225   LEUKOCYTESUR TRACE (A) 01/09/2022 0225    Sepsis Labs: Lactic Acid, Venous    Component Value Date/Time   LATICACIDVEN 0.7 07/19/2022 0918    MICROBIOLOGY: Recent Results (from the past 240 hour(s))  Blood culture (routine x 2)     Status: None (Preliminary result)   Collection Time: 07/19/22  9:14 AM   Specimen: BLOOD RIGHT FOREARM  Result Value Ref Range Status   Specimen Description BLOOD RIGHT FOREARM  Final   Special Requests   Final    BOTTLES DRAWN AEROBIC AND ANAEROBIC Blood Culture adequate volume   Culture   Final    NO GROWTH 3 DAYS Performed at [facility], [address] [place], [place] [address]    Report Status PENDING  Incomplete  Blood culture (routine x 2)     Status: None (Preliminary result)   Collection Time: 07/19/22  9:29 AM   Specimen: BLOOD RIGHT HAND   Result Value Ref Range Status   Specimen Description BLOOD RIGHT HAND  Final   Special Requests   Final    BOTTLES DRAWN AEROBIC AND ANAEROBIC Blood Culture adequate volume   Culture   Final    NO GROWTH 3 DAYS Performed at [facility], [place]  [address], [place], [place] [address]    Report Status PENDING  Incomplete  Surgical PCR screen     Status: Abnormal   Collection Time: 07/19/22 11:00 PM   Specimen: Nasal Mucosa; Nasal Swab  Result Value Ref Range Status   MRSA, PCR POSITIVE (A) NEGATIVE Final    Comment: RESULT CALLED TO, READ BACK BY AND VERIFIED WITH: C ALLEN,RN@0059  07/20/22 MK    Staphylococcus aureus POSITIVE (A) NEGATIVE Final    Comment: (NOTE) The Xpert SA Assay (FDA approved for NASAL specimens in patients 23 years of age and older), is one component of a comprehensive surveillance program. It is not intended to diagnose infection nor to guide or monitor treatment. Performed at [facility], [place] [address] [place], [place] [address]   Aerobic/Anaerobic Culture w Gram Stain (surgical/deep wound)     Status: None (Preliminary result)   Collection Time: 07/21/22  4:38 PM   Specimen: PATH Bone biopsy; Tissue  Result Value Ref Range Status   Specimen Description BIOPSY  Final   Special Requests LEFT 4TH METATARSAL TWO  Final   Gram Stain NO WBC SEEN NO ORGANISMS SEEN   Final   Culture   Final    NO GROWTH < 12 HOURS Performed at [facility], [place] [address] [place], [place] [address]    Report Status PENDING  Incomplete    RADIOLOGY STUDIES/RESULTS: DG Foot Complete Left  Result Date: 07/21/2022 CLINICAL DATA:  Osteomyelitis.  Postoperative evaluation. EXAM: LEFT FOOT - COMPLETE 3+ VIEW COMPARISON:  Left foot radiographs 07/19/2022 and 01/30/2022 FINDINGS: Interval amputation of the remaining prior 50% of the fifth metatarsal. Expected postoperative subcutaneous air. Subacute partially healed fracture of the distal shaft of the fourth  metatarsal with partial healed sclerosis and moderate peripheral callus formation. On lateral view there appears to be approximately 6 mm dorsal displacement of the distal fracture component with respect to the proximal fracture component (slightly greater than 1 bone width). IMPRESSION: 1. Interval amputation of the remaining prior 50% of the fifth metatarsal. No unexpected findings. 2. Subacute partially healed and mildly displaced fracture of the distal shaft of the fourth metatarsal. Electronically Signed   By: Yvonne Kendall M.D.   On: 07/21/2022 17:35   VAS [name] ABI WITH/WO TBI  Result Date: 07/20/2022  LOWER EXTREMITY DOPPLER STUDY Patient Name:  LAQUANDRA VOUGHT  Date of Exam:   07/20/2022 Medical Rec #: [id number]        Accession #:    [id number] Date of Birth: 1980-09-14        Patient Gender: F Patient Age:   50 years Exam Location:  [facility] Procedure:      VAS [name] ABI WITH/WO TBI Referring Phys: Anderson Malta YATES --------------------------------------------------------------------------------  Indications: Peripheral artery disease. Left diabetic foot ulcer. High Risk Factors: Hyperlipidemia, Diabetes, current smoker, coronary artery                    disease.  Vascular Interventions: 06/10/22 - Left anterior tibial and dorsalis pedis                         angioplasty with angioplasty and stenting of left mid                         SFA by Dr. Carlis Abbott. Performing Technologist: Oda Cogan RDMS, RVT  Examination Guidelines: A complete evaluation includes at minimum, Doppler waveform signals and  systolic blood pressure reading at the level of bilateral brachial, anterior tibial, and posterior tibial arteries, when vessel segments are accessible. Bilateral testing is considered an integral part of a complete examination. Photoelectric Plethysmograph (PPG) waveforms and toe systolic pressure readings are included as required and additional duplex testing as needed. Limited examinations for  reoccurring indications may be performed as noted.  ABI Findings: +---------+------------------+-----+---------+----------------+ Right    Rt Pressure (mmHg)IndexWaveform Comment          +---------+------------------+-----+---------+----------------+ Brachial 131                    triphasic                 +---------+------------------+-----+---------+----------------+ ATA      255               1.95 biphasic Non compressible +---------+------------------+-----+---------+----------------+ PTA      123               0.94 biphasic                  +---------+------------------+-----+---------+----------------+ Great Toe88                0.67                           +---------+------------------+-----+---------+----------------+ +--------+------------------+-----+---------+----------------+ Left    Lt Pressure (mmHg)IndexWaveform Comment          +--------+------------------+-----+---------+----------------+ Brachial                       triphasic                 +--------+------------------+-----+---------+----------------+ PTA     115               0.88 biphasic                  +--------+------------------+-----+---------+----------------+ DP      213               1.63 biphasic Non compressible +--------+------------------+-----+---------+----------------+ +-------+-----------+-----------+------------+------------+ ABI/TBIToday's ABIToday's TBIPrevious ABIPrevious TBI +-------+-----------+-----------+------------+------------+ Right  0.94       0.67                                +-------+-----------+-----------+------------+------------+ Left   0.88       NA                                  +-------+-----------+-----------+------------+------------+  Left ABIs appear essentially unchanged compared to prior study on 01/14/22.  Summary: Right: Resting right ankle-brachial index indicates mild right lower extremity arterial disease. The  right toe-brachial index is abnormal. Left: Resting left ankle-brachial index indicates mild left lower extremity arterial disease. *See table(s) above for measurements and observations.  Electronically signed by Deitra Mayo MD on 07/20/2022 at 4:40:44 PM.    Final      LOS: 3 days   Oren Binet, MD  [facility]    To contact the attending provider between 7A-7P or the covering provider during after hours 7P-7A, please log into the web site [email or web address] and access using universal [place] password for that web site. If you do not have the password, please call the hospital operator.  07/22/2022, 10:50 AM

## 2022-07-22 NOTE — Progress Notes (Signed)
CBG 385 - endorsed to dayshift RN

## 2022-07-22 NOTE — Consult Note (Signed)
[facility]    Date of Admission:  07/19/2022     Reason for Consult: diabetic foot osteomyelitis    Referring Provider: Oren Binet     Lines:  Peripheral iv's  Abx: 3/21-c doxy 3/21-c amox-clav  3/18-20 vanc, cefepime, metronidazole        Assessment: Dm foot infection/om  S/p entire left 5th mt resection and 4th mt debridement with bone biopsy  3/20 operative culture and path in progress  Prior cellulitis tx with amox-clav just prior to admission Bcx this admission negative -- no sepsis on presentation   Plan: Ok to transition to doxy/augmentin for now Will follow and adjust based on culture Plan 4 weeks given concerning look of 4th toe during operative finding and extent of infection -- discussed with Dr Jacqualyn Posey and Dr Gustavus Bryant to discharge from id standpoint We can followup on the pathology report and culture ID f/u with me on 4/02 @ 1045AM Discussed with primary team     I spent more than 80 minute reviewing data/chart, and coordinating care and >50% direct face to face time providing counseling/discussing diagnostics/treatment plan with patient   ------------------------------------------------ Principal Problem:   Diabetic foot infection ([place]) Active Problems:   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 female with cad, stage 4 ckd, HFrEF, dm2, hx cva, admitted with left diabetic foot om  She was being followed by podiatry outpatient for same She was started on amox-clav on 1/23 for 10 days  She  developed increasing redness/pain in the left foot so [facility]  Denies f/c  Mri showed om and soft tissue infection   She underwent I&D 3/20. I reviewed operative note PROCEDURE:  LEFT FOOT EXCISION OF FIFTH METATARSAL WITH DEBRIDEMENT OF ULCER, BONE BIOPSY OF THE FOURTH METATARSAL, POSSIBLE FOURTH RAY AMPUTATION OF LEFT TOEAND METATARSAL (Left)   Blood cx this admission was negative She is being discharged tomorrow     Family History  Problem Relation Age of Onset   Thyroid disease Mother    Hypertension Mother    Hyperlipidemia Mother    CAD Mother    CVA Mother    Hyperlipidemia Father    Hypertension Father    CAD Father    Breast cancer Paternal Grandmother 51   Cancer Paternal Grandmother        lung    Social History   Tobacco Use   Smoking status: Former    Packs/day: 1.00    Years: 15.00    Additional pack years: 0.00    Total pack years: 15.00    Types: Cigarettes    Quit date: 01/2022    Years since quitting: 0.5    Passive exposure: Past   Smokeless tobacco: Never  Vaping Use   Vaping Use: Never used  Substance Use Topics   Alcohol use: Yes    Comment: occ   Drug use: No    Allergies  Allergen Reactions   Visipaque [Iodixanol] Nausea And  Vomiting    Patient had vagal response during procedure became hypertensive, and bradycardic. CO2 used during procedure prior to contrast being used, this may be cause as well. Recommended premedicating prior to contrast being used in the future.    Wellbutrin [Bupropion] Hives   Cefepime Rash    Tolerates penicilllin   Ciprofloxacin Hcl Hives and Rash    Hives/rash at injection site; 01/15/22 tolerated IV cipro   Tape Rash    Review of Systems: ROS All Other ROS was negative, except mentioned above   Past Medical History:  Diagnosis Date   Anemia    CAD (coronary artery disease)    a. s/p cath in 03/2014 showing 30% mid-LAD, moderate to severe disease along small D1, patent  LCx, moderate to severe distal OM2 stenosis and moderate diffuse diease along RCA not amenable to PCI   CHF (congestive heart failure) ([place])    a. EF 55-60% in 12/2019 b. EF at 35-40% by echo in 05/2020   CKD (chronic kidney disease) stage 4, GFR 15-29 ml/min (HCC)    Diabetes mellitus without complication (HCC)    Myocardial infarction ([place])    Neuropathy    PVD (peripheral vascular disease) ([place])    Stroke ([place]) 01/2022   L hand weakness       Scheduled Meds:  ascorbic acid  500 mg Oral Daily   atorvastatin  40 mg Oral Daily   Chlorhexidine Gluconate Cloth  6 each Topical Q0600   clopidogrel  75 mg Oral Daily   docusate sodium  100 mg Oral BID   enoxaparin (LOVENOX) injection  90 mg Subcutaneous Q24H   hydrALAZINE  12.5 mg Oral BID   insulin aspart  0-5 Units Subcutaneous QHS   insulin aspart  0-9 Units Subcutaneous TID WC   insulin aspart  8 Units Subcutaneous TID WC   insulin glargine-yfgn  28 Units Subcutaneous Daily   insulin pump   Subcutaneous TID WC, HS, 0200   isosorbide mononitrate  20 mg Oral BID   levothyroxine  125 mcg Oral Q0600   magnesium oxide  400 mg Oral Daily   metoprolol succinate  50 mg Oral Daily   multivitamin with minerals  1 tablet Oral Daily   mupirocin ointment  1 Application Nasal BID   ranolazine  500 mg Oral BID   tiZANidine  4 mg Oral QHS   traZODone  50 mg Oral QHS   warfarin  7.5 mg Oral ONCE-1600   Warfarin - Pharmacist Dosing Inpatient   Does not apply q1600   zinc sulfate  220 mg Oral Daily   Continuous Infusions:  ceFEPime (MAXIPIME) IV 2 g (07/21/22 2218)   metronidazole 500 mg (07/22/22 0338)   vancomycin 1,000 mg (07/21/22 1012)   PRN Meds:.acetaminophen **OR** acetaminophen, alum & mag hydroxide-simeth, bisacodyl, hydrALAZINE, ondansetron **OR** ondansetron (ZOFRAN) IV, oxyCODONE, polyethylene glycol, prochlorperazine   OBJECTIVE: Blood pressure 131/79, pulse 81, temperature 98.4 F (36.9 C), temperature source Oral, resp.  rate 18, height 5\' 5"  (1.651 m), weight 88.5 kg, last menstrual period 07/18/2022, SpO2 100 %.  Physical Exam General/constitutional: no distress, pleasant HEENT: Normocephalic, PER, Conj Clear, EOMI, Oropharynx clear Neck supple CV: rrr no mrg Lungs: clear to auscultation, normal respiratory effort Abd: Soft, Nontender Ext: slight edema lle Skin: No Rash Neuro: nonfocal MSK: left foot dressing clean/dry no strike through     Lab Results Lab Results  Component Value Date   WBC 14.1 (H) 07/22/2022   HGB 9.3 (L) 07/22/2022  HCT 27.8 (L) 07/22/2022   MCV 88.0 07/22/2022   PLT 588 (H) 07/22/2022    Lab Results  Component Value Date   CREATININE 2.74 (H) 07/22/2022   BUN 46 (H) 07/22/2022   NA 131 (L) 07/22/2022   K 5.0 07/22/2022   CL 104 07/22/2022   CO2 16 (L) 07/22/2022    Lab Results  Component Value Date   ALT 41 05/08/2022   AST 31 05/08/2022   ALKPHOS 107 05/08/2022   BILITOT 0.3 05/08/2022      Microbiology: Recent Results (from the past 240 hour(s))  Blood culture (routine x 2)     Status: None (Preliminary result)   Collection Time: 07/19/22  9:14 AM   Specimen: BLOOD RIGHT FOREARM  Result Value Ref Range Status   Specimen Description BLOOD RIGHT FOREARM  Final   Special Requests   Final    BOTTLES DRAWN AEROBIC AND ANAEROBIC Blood Culture adequate volume   Culture   Final    NO GROWTH 3 DAYS Performed at [facility], [address], [place], [place] [address]    Report Status PENDING  Incomplete  Blood culture (routine x 2)     Status: None (Preliminary result)   Collection Time: 07/19/22  9:29 AM   Specimen: BLOOD RIGHT HAND  Result Value Ref Range Status   Specimen Description BLOOD RIGHT HAND  Final   Special Requests   Final    BOTTLES DRAWN AEROBIC AND ANAEROBIC Blood Culture adequate volume   Culture   Final    NO GROWTH 3 DAYS Performed at [facility], [place] [address] [place], [place] [address]    Report Status PENDING   Incomplete  Surgical PCR screen     Status: Abnormal   Collection Time: 07/19/22 11:00 PM   Specimen: Nasal Mucosa; Nasal Swab  Result Value Ref Range Status   MRSA, PCR POSITIVE (A) NEGATIVE Final    Comment: RESULT CALLED TO, READ BACK BY AND VERIFIED WITH: C ALLEN,RN@0059  07/20/22 [place]    Staphylococcus aureus POSITIVE (A) NEGATIVE Final    Comment: (NOTE) The Xpert SA Assay (FDA approved for NASAL specimens in patients 56 years of age and older), is one component of a comprehensive surveillance program. It is not intended to diagnose infection nor to guide or monitor treatment. Performed at [facility], [place] [address], [place],  [address]   Aerobic/Anaerobic Culture w Gram Stain (surgical/deep wound)     Status: None (Preliminary result)   Collection Time: 07/21/22  4:38 PM   Specimen: PATH Bone biopsy; Tissue  Result Value Ref Range Status   Specimen Description BIOPSY  Final   Special Requests LEFT 4TH METATARSAL TWO  Final   Gram Stain NO WBC SEEN NO ORGANISMS SEEN   Final   Culture   Final    NO GROWTH < 12 HOURS Performed at [facility], [place] [address], [place],  [address]    Report Status PENDING  Incomplete     Serology:    Imaging: If present, new imagings (plain films, ct scans, and mri) have been personally visualized and interpreted; radiology reports have been reviewed. Decision making incorporated into the Impression / Recommendations.  3/18 mri left foot 1. Prior transmetatarsal amputation of the fifth ray. Wound or ulceration at the lateral foot adjacent to the fifth metatarsal base. 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 2. Minimal bone marrow edema of  the fourth metatarsal base, nonspecific and may be reactive. Trace fluid in the fourth and fifth tarsometatarsal joints, likely reactive although developing septic arthritis not excluded. 3. Generalized soft tissue  edema of the left foot. No organized or drainable fluid collections. 4. Displaced ununited fracture of the distal shaft of the fourth metatarsal without residual bone marrow edema.  Jabier Mutton, [facility] [place] [phone] pager    07/22/2022, 11:09 AM

## 2022-07-22 NOTE — Patient Instructions (Signed)
Visit Information  Thank you for taking time to visit with me today. Please don't hesitate to contact me if I can be of assistance to you.   Following are the goals we discussed today:   Goals Addressed               This Visit's Progress     Assist with Applying for Medicaid, Location manager. (pt-stated)   On track     Care Coordination Interventions:  Interventions Today    Flowsheet Row Most Recent Value  Chronic Disease   Chronic disease during today's visit Other  [History of CVA, Tobacco Abuse & Class 2 Morbid Obesity]  General Interventions   General Interventions Discussed/Reviewed General Interventions Discussed, Labs, Annual Foot Exam, General Interventions Reviewed, Annual Eye Exam, Durable Medical Equipment (DME), Community Resources, Level of Care, Communication with, Doctor Visits, Vaccines, Health Screening  [Communication with Primary Care Provider]  Labs Hgb A1c every 3 months, Kidney Function  [Encouraged]  Vaccines COVID-19, Flu, Pneumonia, RSV, Shingles, Tetanus/Pertussis/Diphtheria  [Encouraged]  Doctor Visits Discussed/Reviewed Doctor Visits Discussed, Doctor Visits Reviewed, Annual Wellness Visits, PCP, Specialist  [Encouraged]  Health Screening Bone Density, Colonoscopy, Mammogram  [Encouraged]  Durable Medical Equipment (DME) BP Cuff, Glucomoter, Oxygen, Walker, Community education officer  PCP/Specialist Visits Compliance with follow-up visit  [Encouraged]  Communication with PCP/Specialists, RN  [Encouraged]  Level of Care Adult Daycare, Applications, Assisted Living, [place]  Applications Medicaid, Personal Care Services, [place]-2  Exercise Interventions   Exercise Discussed/Reviewed Exercise Discussed, Assistive device use and maintanence, Exercise Reviewed, Physical Activity  [Encouraged]  Physical Activity Discussed/Reviewed Physical Activity Discussed, Physical  Activity Reviewed, Home Exercise Program (HEP), Types of exercise  [Encouraged]  Education Interventions   Education Provided Provided Engineer, site, Provided Web-based Education, Provided Education  Provided Verbal Education On Nutrition, Foot Care, Eye Care, Blood Sugar Monitoring, Medication, Personal assistant, [employer], Exercise, Applications, Mental Health/Coping with Illness, When to see the doctor  Applications Medicaid, Personal Care Services, [place] Discussed, Mental Health Reviewed, Coping Strategies, Crisis, Suicide, Substance Abuse, Grief and Loss, Depression, Anxiety  Nutrition Interventions   Nutrition Discussed/Reviewed Nutrition Discussed, Adding fruits and vegetables, Increaing proteins, Decreasing fats, Decreasing salt, Fluid intake, Nutrition Reviewed, Carbohydrate meal planning, Portion sizes, Decreasing sugar intake  [Encouraged]  Pharmacy Interventions   Pharmacy Dicussed/Reviewed Pharmacy Topics Discussed, Pharmacy Topics Reviewed, Medication Adherence, Affording Medications  [Encouraged]  Safety Interventions   Safety Discussed/Reviewed Safety Discussed, Safety Reviewed, Fall Risk, Home Safety  [Encouraged]  Home Safety Assistive Devices, Need for home safety assessment, Refer for home visit, Refer for community resources, Contact provider for referral to PT/OT  Advanced Directive Interventions   Advanced Directives Discussed/Reviewed Advanced Directives Discussed, Advanced Directives Reviewed, Advanced Care Planning  [Do Not Resusitate]       Assessed Social Determinant of Health Barriers. Discussed Plans for Ongoing Care Management Follow Up. Provided Tree surgeon Information for Care Management Team Members. Screened for Signs & Symptoms of Depression, Related to Chronic Disease State.  PHQ2 & PHQ9 Depression Screen Completed & Results Reviewed.  Suicidal Ideation & Homicidal  Ideation Assessed - None Present.   Domestic Violence Assessed - None Present. Access to Weapons Assessed - None Present.   Active Listening & Reflection Utilized.  Verbalization of Feelings Encouraged.  Emotional Support Provided. Feelings of Caregiver Burnout Validated. Caregiver Stress Acknowledged. Caregiver Resources Reviewed. Caregiver Support Groups  Emailed. Self-Enrollment in Caregiver Support Group of Interest Emphasized. Crisis Support Information, Agencies, [place] Emailed. Problem Solving Interventions Identified. Task-Centered Solutions Implemented.   Solution-Focused Strategies Developed. Acceptance & Commitment Therapy Introduced. Brief Cognitive Behavioral Therapy Initiated. Client-Centered Therapy Enacted. Reviewed Prescription Medications & Discussed Importance of Compliance. Quality of Sleep Assessed & Sleep Hygiene Techniques Promoted. Emphasized Importance of Patient Quitting Smoking & Offered Smoking Cessation Classes, Services, Agencies & Resources. Discussed Higher Level of Care Options ([place].e. [place]) & Encouraged Consideration. Verified No In-Home Care Services, [employer], Building control surveyor, Etc., Covered Under Teaching laboratory technician through [employer].  Reviewed Designer, television/film set through [facility] & Encouraged Completion of Medicaid Application, through The [place] [phone]). Verified No Long-Term Care Insurance Benefits, Secondary Insurance Policies, Plans, Coverage, Etc.  Confirmed Patient, Nor Husband, Were Veterans, Making Patient Ineligible to Apply for Aid & Attendance Benefits, Through [employer]. Agreed to Assist with Completion of Medicaid Application & Submission to The [place] [phone]), for Processing. Please Review the Following List of Express Scripts, [employer], Medical laboratory scientific officer, Emailed on 07/22/2022: ~ Adult Day Care Programs  ~ [place] ~ [place] ~ 2023 Medicaid Tips ~ [employer] Application ~ [place] ~ [place] Providers Encouraged Tree surgeon with Express Scripts, [place], in An Effort to [place] in [place]. Encouraged Completion of Application for [place], through [facility] [phone]), If Approved for Medicaid, through The [place] [phone]). Encouraged Tree surgeon with CSW (# [phone]), if You Have Questions, Need Assistance, or If Additional Social Work Needs Are Identified Between Now & Our Next Scheduled Follow-Up Outreach Call.      Our next appointment is by telephone on 08/06/2022 at 12:00 pm.  Please call the care guide team at [phone] if you need to cancel or reschedule your appointment.   If you are experiencing a Mental Health or [place] or need someone to talk to, please call the Suicide and Crisis Lifeline: 988 call the [place] National Suicide Prevention Lifeline: 641-724-7602 or TTY: (607)874-9562 TTY 978-754-9457) to talk to a trained counselor call 1-800-273-TALK (toll free, 24 hour hotline) go to [facility] [address], [place] [id number]) call the [place]: [phone] call 911  Patient verbalizes understanding of instructions and care plan provided today and agrees to view in [place]. Active MyChart status and patient understanding of how to access instructions and care plan via MyChart confirmed with patient.     Telephone follow up appointment with care  management team member scheduled for:  08/06/2022 at 12:00 pm.  Nat Christen, BSW, MSW, [place]  Licensed Clinical Social Worker  [place]  Mailing [place]. [address], [place], [place] [address] Physical Address-[address] [place], [place] [address] Toll Free Main # [phone] Fax # [phone] Cell # [phone] [email or web address]

## 2022-07-23 ENCOUNTER — Other Ambulatory Visit (HOSPITAL_COMMUNITY): Payer: BC Managed Care – PPO

## 2022-07-23 DIAGNOSIS — E11628 Type 2 diabetes mellitus with other skin complications: Secondary | ICD-10-CM | POA: Diagnosis not present

## 2022-07-23 DIAGNOSIS — E1022 Type 1 diabetes mellitus with diabetic chronic kidney disease: Secondary | ICD-10-CM | POA: Diagnosis not present

## 2022-07-23 DIAGNOSIS — E782 Mixed hyperlipidemia: Secondary | ICD-10-CM | POA: Diagnosis not present

## 2022-07-23 DIAGNOSIS — L089 Local infection of the skin and subcutaneous tissue, unspecified: Secondary | ICD-10-CM | POA: Diagnosis not present

## 2022-07-23 DIAGNOSIS — I5020 Unspecified systolic (congestive) heart failure: Secondary | ICD-10-CM | POA: Diagnosis not present

## 2022-07-23 LAB — BASIC METABOLIC PANEL
Anion gap: 8 (ref 5–15)
BUN: 42 mg/dL — ABNORMAL HIGH (ref 6–20)
CO2: 21 mmol/L — ABNORMAL LOW (ref 22–32)
Calcium: 8.3 mg/dL — ABNORMAL LOW (ref 8.9–10.3)
Chloride: 105 mmol/L (ref 98–111)
Creatinine, Ser: 2.65 mg/dL — ABNORMAL HIGH (ref 0.44–1.00)
GFR, Estimated: 23 mL/min — ABNORMAL LOW (ref >60)
Glucose, Bld: 97 mg/dL (ref 70–99)
Potassium: 4 mmol/L (ref 3.5–5.1)
Sodium: 134 mmol/L — ABNORMAL LOW (ref 135–145)

## 2022-07-23 LAB — PROTIME-INR
INR: 1.8 — ABNORMAL HIGH (ref 0.8–1.2)
Prothrombin Time: 20.3 seconds — ABNORMAL HIGH (ref 11.4–15.2)

## 2022-07-23 LAB — GLUCOSE, CAPILLARY
Glucose-Capillary: 106 mg/dL — ABNORMAL HIGH (ref 70–99)
Glucose-Capillary: 111 mg/dL — ABNORMAL HIGH (ref 70–99)
Glucose-Capillary: 120 mg/dL — ABNORMAL HIGH (ref 70–99)
Glucose-Capillary: 126 mg/dL — ABNORMAL HIGH (ref 70–99)
Glucose-Capillary: 96 mg/dL (ref 70–99)

## 2022-07-23 LAB — SURGICAL PATHOLOGY

## 2022-07-23 MED ORDER — ENOXAPARIN SODIUM 100 MG/ML IJ SOSY
90.0000 mg | PREFILLED_SYRINGE | Freq: Two times a day (BID) | INTRAMUSCULAR | Status: DC
  Administered 2022-07-23: 90 mg via SUBCUTANEOUS
  Filled 2022-07-23: qty 1

## 2022-07-23 MED ORDER — HEPARIN (PORCINE) 25000 UT/250ML-% IV SOLN
1600.0000 [IU]/h | INTRAVENOUS | Status: DC
  Administered 2022-07-23 – 2022-07-24 (×2): 950 [IU]/h via INTRAVENOUS
  Administered 2022-07-25: 1500 [IU]/h via INTRAVENOUS
  Administered 2022-07-25: 1050 [IU]/h via INTRAVENOUS
  Filled 2022-07-23 (×3): qty 250

## 2022-07-23 NOTE — Plan of Care (Signed)
  Problem: Education: Goal: Knowledge of General Education information will improve Description: Including pain rating scale, medication(s)/side effects and non-pharmacologic comfort measures Outcome: Not Progressing   Problem: Health Behavior/Discharge Planning: Goal: Ability to manage health-related needs will improve Outcome: Not Progressing   Problem: Clinical Measurements: Goal: Ability to maintain clinical measurements within normal limits will improve Outcome: Not Progressing Goal: Will remain free from infection Outcome: Not Progressing Goal: Diagnostic test results will improve Outcome: Not Progressing Goal: Respiratory complications will improve Outcome: Not Progressing Goal: Cardiovascular complication will be avoided Outcome: Not Progressing   Problem: Activity: Goal: Risk for activity intolerance will decrease Outcome: Not Progressing   Problem: Nutrition: Goal: Adequate nutrition will be maintained Outcome: Not Progressing   Problem: Coping: Goal: Level of anxiety will decrease Outcome: Not Progressing   Problem: Elimination: Goal: Will not experience complications related to bowel motility Outcome: Not Progressing Goal: Will not experience complications related to urinary retention Outcome: Not Progressing   Problem: Pain Managment: Goal: General experience of comfort will improve Outcome: Not Progressing   Problem: Safety: Goal: Ability to remain free from injury will improve Outcome: Not Progressing   Problem: Skin Integrity: Goal: Risk for impaired skin integrity will decrease Outcome: Not Progressing   Problem: Education: Goal: Ability to describe self-care measures that may prevent or decrease complications (Diabetes Survival Skills Education) will improve Outcome: Not Progressing Goal: Individualized Educational Video(s) Outcome: Not Progressing

## 2022-07-23 NOTE — Plan of Care (Signed)
Brief id note  Patient kept for cardiac/chest pain w/u. Trop very elevated yesterday evening   -no current abx change at this time -will follow culture -discussed with primary team

## 2022-07-23 NOTE — Consult Note (Addendum)
Cardiology Consultation   Patient ID: Arilla Flis MRN: [id number]; DOB: 02/28/1981  Admit date: 07/19/2022 Date of Consult: 07/23/2022  PCP:  Johnette Abraham, [place] Prov[facility] Cardiologist:  Carlyle Dolly, MD   {  Patient Profile:   Marielly Spinale is a 42 y.o. female with a hx of CVA requiring thrombectomy September 2023 (on anticoagulation), PAD s/p left anterior tibial/dorsalis pedis angioplasty, stenting of the mid SFE on 06/10/22, CAD on cath in [id number], chronic systolic heart failure, ICM, DM, HTN, CKD stage IV who is being seen 07/23/2022 for the evaluation of elevated troponin at the request of Dr. Sloan Leiter.  History of Present Illness:   Ms. Dunkerley is followed by Dr. Harl Bowie for the above cardiac issues. Patient had a cath in 2015 showing 30% mLAD, moderate to severe disease along small D1, patent Lcx, moderate to severe distal OM2 stenosis and moderate diffuse disease along RCA not amenable to PCI. Echo in 12/2019 showed LVEF 55-60%, mild LVH.  Myoview lexiscan showed large periapical defect consistent with scar, mild apical anterolateral. Echo 01/2022 showed LVEF 35-40%, G2DD, mild LVH, elevated LVEDP, normal RVSF. Due to CKD patient unable to undergo cath for kidney disease. Echo in 05/2020 showed LVEF 35-40%, G3DD.   Last seen 05/10/22 and was stable from a cardiac perspective.   The patient presented to the ER 07/19/22 for nausea, vomiting and chest pain. She was on two antibiotics for foot wound. She was scheduled for debridement 4/5 with podiatry. Reported h/o MRSA in her foot. She has lower leg edema on the left lower extremity. No fevers reported. Chest pain episodes were chronic, but more frequent recently. Chest pain in the center of the chest and pressure like.  In the ER BP 130/92, pulse 98bpm, afebrile, RR 20. O2 99%. Labs showed Hgb 9.1, Scr 2.68, BUN 35, plt 506. HS trop [id number]. EKG shows NSR, 92bpm, anterior enlargement, TWI aVL. MRI showed acute  osteomyelitis of residual fitth metatarsal. CXR showed no PVA. The patient was started on IV abx and admitted.   On 3/22 patient reported chest pain and HS troponin went up to 1210. EKG with NSR and no ischemic changes.   Past Medical History:  Diagnosis Date   Anemia    CAD (coronary artery disease)    a. s/p cath in 03/2014 showing 30% mid-LAD, moderate to severe disease along small D1, patent LCx, moderate to severe distal OM2 stenosis and moderate diffuse diease along RCA not amenable to PCI   CHF (congestive heart failure) ([place])    a. EF 55-60% in 12/2019 b. EF at 35-40% by echo in 05/2020   CKD (chronic kidney disease) stage 4, GFR 15-29 ml/min (HCC)    Diabetes mellitus without complication (HCC)    Myocardial infarction ([place])    Neuropathy    PVD (peripheral vascular disease) ([place])    Stroke ([place]) 01/2022   L hand weakness    Past Surgical History:  Procedure Laterality Date   ABDOMINAL AORTOGRAM W/LOWER EXTREMITY N/A 06/10/2022   Procedure: ABDOMINAL AORTOGRAM W/LOWER EXTREMITY;  Surgeon: Marty Heck, MD;  Location: [facility];  Service: Cardiovascular;  Laterality: N/A;   AMPUTATION Left 09/02/2021   Procedure: AMPUTATION RAY;  Surgeon: Criselda Peaches, DPM;  Location: [facility];  Service: Podiatry;  Laterality: Left;  sagittal saw, 3L bag saline & Pulse   Cardiac catherization     CHOLECYSTECTOMY     I & D EXTREMITY Left 08/30/2021   Procedure:  IRRIGATION AND DEBRIDEMENT WITH BONE BIOPSY;  Surgeon: [name], DPM;  Location: [facility];  Service: Podiatry;  Laterality: Left;   IR ANGIO VERTEBRAL SEL SUBCLAVIAN INNOMINATE UNI L MOD SED  01/18/2022   IR CT HEAD LTD  01/08/2022   IR INTRA CRAN STENT  01/08/2022   IR PERCUTANEOUS ART THROMBECTOMY/INFUSION INTRACRANIAL INC DIAG ANGIO  01/08/2022   IRRIGATION AND DEBRIDEMENT FOOT Left 09/04/2021   Procedure: IRRIGATION AND DEBRIDEMENT FOOT AND CLOSURE;  Surgeon: Criselda Peaches, DPM;  Location: [facility];  Service: Podiatry;   Laterality: Left;   METATARSAL OSTEOTOMY Left 07/21/2022   Procedure: LEFT FOOT EXCISION OF FIFTH METATARSAL WITH DEBRIDEMENT OF ULCER, POSSIBLE BONE BIOPSY OF THE FOURTH METATARSAL, POSSIBLE FOURTH RAY AMPUTATION OF LEFT TOEAND METATARSAL;  Surgeon: Trula Slade, DPM;  Location: [facility];  Service: Podiatry;  Laterality: Left;   PERIPHERAL VASCULAR BALLOON ANGIOPLASTY Left 06/10/2022   Procedure: PERIPHERAL VASCULAR BALLOON ANGIOPLASTY;  Surgeon: Marty Heck, MD;  Location: [facility];  Service: Cardiovascular;  Laterality: Left;  AT and DP   PERIPHERAL VASCULAR INTERVENTION Left 06/10/2022   Procedure: PERIPHERAL VASCULAR INTERVENTION;  Surgeon: Marty Heck, MD;  Location: [facility];  Service: Cardiovascular;  Laterality: Left;  SFA   RADIOLOGY WITH ANESTHESIA N/A 01/08/2022   Procedure: IR WITH ANESTHESIA;  Surgeon: Radiologist, Medication, MD;  Location: [facility];  Service: Radiology;  Laterality: N/A;   THROMBECTOMY BRACHIAL ARTERY Right 11/18/2021   Procedure: RIGHT BRACHIAL, RADIAL, & ULNAR ARTERY THROMBECTOMY.;  Surgeon: Marty Heck, MD;  Location: [facility];  Service: Vascular;  Laterality: Right;   TUBAL LIGATION       Home Medications:  Prior to Admission medications   Medication Sig Start Date End Date Taking? Authorizing Provider  acetaminophen (TYLENOL) 325 MG tablet Take 1-2 tablets (325-650 mg total) by mouth every 4 (four) hours as needed for mild pain. Patient taking differently: Take 650 mg by mouth as needed for mild pain. 02/18/22  Yes Love, Ivan Anchors, PA-C  atorvastatin (LIPITOR) 40 MG tablet Take 1 tablet (40 mg total) by mouth daily. Courtesy fill/ pt to get established with a primary MD for further refills 06/01/22 11/28/22 Yes Johnette Abraham, MD  clopidogrel (PLAVIX) 75 MG tablet Take 1 tablet (75 mg total) by mouth daily. 04/29/22  Yes Garvin Fila, MD  diclofenac Sodium (VOLTAREN ARTHRITIS PAIN) 1 % GEL Apply 2 g topically 4 (four)  times daily. Patient taking differently: Apply 1 Application topically 2 (two) times daily as needed (pain). 04/19/22  Yes Jennye Boroughs, MD  furosemide (LASIX) 40 MG tablet Take 40 mg daily as needed for swelling 03/05/22  Yes Branch, Alphonse Guild, MD  hydrALAZINE (APRESOLINE) 25 MG tablet Take 0.5 tablets (12.5 mg total) by mouth 2 (two) times daily. 05/10/22  Yes BranchAlphonse Guild, MD  ibuprofen (ADVIL) 200 MG tablet Take 400 mg by mouth as needed for moderate pain or mild pain.   Yes [provider]  insulin aspart (NOVOLOG) 100 UNIT/ML injection Use with Omnipod for daily dose around 80 units daily 06/17/22  Yes Reardon, Loree Fee J, NP  Insulin Disposable Pump (OMNIPOD 5 G6 PODS, GEN 5,) MISC Change pod every 48-72 hours 06/17/22  Yes Reardon, Loree Fee J, NP  isosorbide mononitrate (ISMO) 20 MG tablet Take 1 tablet (20 mg total) by mouth 2 (two) times daily. Courtesy fill/ pt to get established with a primary MD for further refills 06/01/22 11/28/22 Yes Marland Kitchen  E, MD  levothyroxine (SYNTHROID) 125 MCG tablet Take 1 tablet (125 mcg total) by mouth daily at 6 (six) AM. 06/17/22 06/12/23 Yes Reardon, Juanetta Beets, NP  magnesium oxide (MAG-OX) 400 MG tablet Take 0.5 tablets (200 mg total) by mouth at bedtime. Patient taking differently: Take 400 mg by mouth 3 (three) times a week. 03/12/22  Yes Benay Pike, MD  Multiple Vitamin (MULTIVITAMIN WITH MINERALS) TABS tablet Take 1 tablet by mouth daily. 02/18/22  Yes Love, Ivan Anchors, PA-C  nitroGLYCERIN (NITROSTAT) 0.4 MG SL tablet Place 1 tablet (0.4 mg total) under the tongue every 5 (five) minutes x 3 doses as needed for chest pain. 02/18/22  Yes Love, Ivan Anchors, PA-C  ondansetron (ZOFRAN) 4 MG tablet Take 1 tablet (4 mg total) by mouth every 8 (eight) hours as needed for nausea or vomiting. 03/01/22  Yes Lovorn, Jinny Blossom, MD  oxyCODONE (OXY IR/ROXICODONE) 5 MG immediate release tablet Take 1 tablet (5 mg total) by mouth every 6 (six) hours as needed  for severe pain. 07/08/22  Yes McDonald, Stephan Minister, DPM  pantoprazole (PROTONIX) 40 MG tablet Take 1 tablet (40 mg total) by mouth daily. Courtesy fill/ pt to get established with a primary MD for further refills Patient taking differently: Take 40 mg by mouth daily as needed (reflux). 06/01/22 11/28/22 Yes Johnette Abraham, MD  polyethylene glycol (MIRALAX / GLYCOLAX) 17 g packet Take 17 g by mouth 2 (two) times daily. Patient taking differently: Take 17 g by mouth as needed for moderate constipation. 03/03/22  Yes Bayard Hugger, NP  ranolazine (RANEXA) 500 MG 12 hr tablet Take 1 tablet (500 mg total) by mouth 2 (two) times daily. Courtesy fill/ pt to get established with a primary MD for further refills 06/01/22 08/30/22 Yes Johnette Abraham, MD  SANTYL 250 UNIT/GM ointment Apply 1 Application topically daily. 05/31/22  Yes [provider]  tiZANidine (ZANAFLEX) 4 MG tablet Take 1 tablet (4 mg total) by mouth at bedtime. Courtesy fill/ pt to get established with a primary MD for further refills 06/01/22  Yes Johnette Abraham, MD  traZODone (DESYREL) 50 MG tablet Take 1 tablet (50 mg total) by mouth at bedtime. 07/09/22  Yes Jennye Boroughs, MD  amoxicillin-clavulanate (AUGMENTIN) 875-125 MG tablet Take 1 tablet by mouth 2 (two) times daily. Patient not taking: Reported on 07/20/2022 05/25/22   Criselda Peaches, DPM  Continuous Blood Gluc Sensor (DEXCOM G6 SENSOR) MISC Change sensor every 10 days as directed 09/29/21   Brita Romp, NP  enoxaparin (LOVENOX) 150 MG/ML injection Inject 1 mL (150 mg total) into the skin daily for 10 days. Patient not taking: Reported on 07/20/2022 06/06/22 06/16/22  Arnoldo Lenis, MD  Insulin Disposable Pump (OMNIPOD 5 G6 PODS, GEN 5,) MISC Change pod every 48-72 hours 07/07/22   Brita Romp, NP  methocarbamol (ROBAXIN) 500 MG tablet Take 1 tablet (500 mg total) by mouth every 6 (six) hours as needed for muscle spasms. Patient not taking: Reported on 07/20/2022  04/05/22   Johnette Abraham, MD  metoprolol succinate (TOPROL XL) 50 MG 24 hr tablet Take 1 tablet (50 mg total) by mouth daily. 05/10/22   Arnoldo Lenis, MD  ramelteon (ROZEREM) 8 MG tablet Take 1 tablet (8 mg total) by mouth at bedtime. Patient not taking: Reported on 07/20/2022 02/19/22   Love, Ivan Anchors, PA-C  warfarin (COUMADIN) 5 MG tablet TAKE ONE TABLET BY MOUTH DAILY EXCEPT 1 AND 1/2 TABLET ON [facility]  AND SATURDAYS OR AS DIRECTED Patient taking differently: Take 5-7.5 mg by mouth every evening. TAKE ONE TABLET BY MOUTH DAILY EXCEPT 1 AND 1/2 TABLET ON [facility] AND SATURDAYS OR AS DIRECTED 06/15/22   Arnoldo Lenis, MD    Inpatient Medications: Scheduled Meds:  amoxicillin-clavulanate  1 tablet Oral BID   ascorbic acid  500 mg Oral Daily   atorvastatin  40 mg Oral Daily   Chlorhexidine Gluconate Cloth  6 each Topical Q0600   clopidogrel  75 mg Oral Daily   docusate sodium  100 mg Oral BID   doxycycline  100 mg Oral Q12H   enoxaparin (LOVENOX) injection  90 mg Subcutaneous Q12H   hydrALAZINE  12.5 mg Oral BID   insulin pump   Subcutaneous TID WC, HS, 0200   isosorbide mononitrate  20 mg Oral BID   levothyroxine  125 mcg Oral Q0600   magnesium oxide  400 mg Oral Daily   metoprolol succinate  50 mg Oral Daily   multivitamin with minerals  1 tablet Oral Daily   mupirocin ointment  1 Application Nasal BID   pantoprazole  40 mg Oral Daily   ranolazine  500 mg Oral BID   tiZANidine  4 mg Oral QHS   traZODone  50 mg Oral QHS   Warfarin - Pharmacist Dosing Inpatient   Does not apply q1600   zinc sulfate  220 mg Oral Daily   Continuous Infusions:  PRN Meds: acetaminophen **OR** acetaminophen, alum & mag hydroxide-simeth, bisacodyl, hydrALAZINE, ondansetron **OR** ondansetron (ZOFRAN) IV, oxyCODONE, polyethylene glycol, prochlorperazine  Allergies:    Allergies  Allergen Reactions   Visipaque [Iodixanol] Nausea And Vomiting    Patient had vagal response during procedure  became hypertensive, and bradycardic. CO2 used during procedure prior to contrast being used, this may be cause as well. Recommended premedicating prior to contrast being used in the future.    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  Tobacco Use   Smoking status: Former    Packs/day: 1.00    Years: 15.00    Additional pack years: 0.00    Total pack years: 15.00    Types: Cigarettes    Quit date: 01/2022    Years since quitting: 0.5    Passive exposure: Past   Smokeless tobacco: Never  Vaping Use   Vaping Use: Never used  Substance and Sexual Activity   Alcohol use: Yes    Comment: occ   Drug use: No   Sexual activity: Yes    Birth control/protection: Surgical    Comment: tubal   Other Topics Concern   Not on file  Social History Narrative   Lives with husband and kids   Right handed   Drinks 9+ cups caffeine daily   Social Determinants of Health   Financial Resource Strain: Low Risk  (03/03/2022)   Overall Financial Resource Strain (CARDIA)    Difficulty of Paying Living Expenses: Not very hard  Recent Concern:  Financial Resource Strain - High Risk (01/14/2022)   Overall Financial Resource Strain (CARDIA)    Difficulty of Paying Living Expenses: Hard  Food Insecurity: No Food Insecurity (03/03/2022)   Hunger Vital Sign    Worried About Running Out of Food in the Last Year: Never true    Ran Out of Food in the Last Year: Never true  Recent Concern: Food Insecurity - Food Insecurity Present (01/16/2022)   Hunger Vital Sign     Worried About Running Out of Food in the Last Year: Sometimes true    Ran Out of Food in the Last Year: Sometimes true  Transportation Needs: No Transportation Needs (03/03/2022)   PRAPARE - Hydrologist (Medical): No    Lack of Transportation (Non-Medical): No  Physical Activity: Inactive (03/03/2022)   Exercise Vital Sign    Days of Exercise per Week: 0 days    Minutes of Exercise per Session: 0 min  Stress: No Stress Concern Present (03/03/2022)   [place]    Feeling of Stress : Only a little  Recent Concern: Stress - Stress Concern Present (12/22/2021)   [place]    Feeling of Stress : To some extent  Social Connections: Socially Integrated (03/03/2022)   Social Connection and Isolation Panel [NHANES]    Frequency of Communication with Friends and Family: More than three times a week    Frequency of Social Gatherings with Friends and Family: More than three times a week    Attends Religious Services: More than 4 times per year    Active Member of [employer] or Organizations: Yes    Attends Music therapist: More than 4 times per year    Marital Status: Married  Human resources officer Violence: Not At Risk (03/03/2022)   Humiliation, Afraid, Rape, and Kick questionnaire    Fear of Current or Ex-Partner: No    Emotionally Abused: No    Physically Abused: No    Sexually Abused: No    Family History:    Family History  Problem Relation Age of Onset   Thyroid disease Mother    Hypertension Mother    Hyperlipidemia Mother    CAD Mother    CVA Mother    Hyperlipidemia Father    Hypertension Father    CAD Father    Breast cancer Paternal Grandmother 63   Cancer Paternal Grandmother        lung     ROS:  Please see the history of present illness.   All other ROS reviewed and negative.     Physical Exam/Data:    Vitals:   07/22/22 2028 07/23/22 0447 07/23/22 0743 07/23/22 1032  BP: 135/74 (!) 143/101 (!) 154/105 (!) 146/87  Pulse: 81  86 80  Resp: 16 16 18    Temp: 97.9 F (36.6 C) 98 F (36.7 C) 98.1 F (36.7 C)   TempSrc:  Oral Oral   SpO2: 99% 100% 100%   Weight:      Height:        Intake/Output Summary (Last 24 hours) at 07/23/2022 1116 Last data filed at 07/23/2022 0900 Gross per 24 hour  Intake 1680 ml  Output 100 ml  Net 1580 ml      07/19/2022    5:30 AM 06/22/2022    6:32 AM 06/17/2022    2:22 PM  Last 3 Weights  Weight (lbs) 195 lb 185 lb  215 lb 12.8 oz  Weight (kg) 88.451 kg 83.915 kg 97.886 kg     Body mass index is 32.45 kg/m.  General:  Well nourished, well developed, in no acute distress HEENT: normal Neck: no JVD Vascular: No carotid bruits; Distal pulses 2+ bilaterally Cardiac:  normal S1, S2; RRR; no murmur  Lungs:  clear to auscultation bilaterally, no wheezing, rhonchi or rales  Abd: soft, nontender, no hepatomegaly  Ext: no edema Musculoskeletal:  No deformities, BUE and BLE strength normal and equal Skin: warm and dry  Neuro:  CNs 2-12 intact, no focal abnormalities noted Psych:  Normal affect   EKG:  The EKG was personally reviewed and demonstrates:  NSR, 81bpm TWI aVL   Relevant CV Studies:  Echo 01/2022  1. Left ventricular ejection fraction, by estimation, is 35 to 40%. Left  ventricular ejection fraction by 2D MOD biplane is 37.1 %. The left  ventricle has moderately decreased function. The left ventricle  demonstrates regional wall motion abnormalities  (see scoring diagram/findings for description). There is mild left  ventricular hypertrophy. Left ventricular diastolic parameters are  consistent with Grade II diastolic dysfunction (pseudonormalization).  Elevated left ventricular end-diastolic pressure.  The E/e' is 35. Wall motion abnormality suggestive of LAD territory  infarct or less likely Takatsubo cardiomyopathy.   2. Right  ventricular systolic function is normal. The right ventricular  size is normal. Tricuspid regurgitation signal is inadequate for assessing  PA pressure.   3. The mitral valve is abnormal. Trivial mitral valve regurgitation.   4. The inferior vena cava is normal in size with <50% respiratory  variability, suggesting right atrial pressure of 8 mmHg.   5. The aortic valve is tricuspid. Aortic valve regurgitation is not  visualized.   Echo bubble study 01/2022   1. There is akinesis of the mid-to-apical septal LV segments, all apical  segments, and apex. There is significant thinning of the mid-to-apical  septal segments and apex likely represents scarring/infarct. The  basal-to-mid inferior and basal septal  segments are hypokinetic.   2. No LV thrombus visualized on definity imaging.   3. Left ventricular ejection fraction, by estimation, is 30 to 35%. The  left ventricle has moderately decreased function. The left ventricle  demonstrates regional wall motion abnormalities (see scoring  diagram/findings for description). The left  ventricular internal cavity size was mildly-to-moderately dilated. There  is mild concentric left ventricular hypertrophy. Indeterminate diastolic  filling due to E-A fusion.   4. Right ventricular systolic function is normal. The right ventricular  size is normal.   5. The mitral valve is normal in structure. Trivial mitral valve  regurgitation.   6. The aortic valve is tricuspid. Aortic valve regurgitation is not  visualized. No aortic stenosis is present.   7. The inferior vena cava is dilated in size with >50% respiratory  variability, suggesting right atrial pressure of 8 mmHg.   8. Agitated saline contrast bubble study was negative, with no evidence  of any interatrial shunt.   Comparison(s): Compared to prior TTE on 10/2021, the EF has dropped to  30-35% with similar wall motion.   Echo 10/2021 1. Left ventricular ejection fraction, by estimation,  is 40 to 45%. The  left ventricle has mildly decreased function. The left ventricle  demonstrates regional wall motion abnormalities (see scoring  diagram/findings for description). There is moderate  concentric left ventricular hypertrophy. Left ventricular diastolic  parameters are consistent with Grade II diastolic dysfunction  (pseudonormalization). Elevated left ventricular end-diastolic pressure.  2. Right ventricular systolic function is normal. The right ventricular  size is normal. There is normal pulmonary artery systolic pressure.   3. Left atrial size was mildly dilated.   4. The mitral valve is normal in structure. Trivial mitral valve  regurgitation. No evidence of mitral stenosis.   5. The aortic valve is tricuspid. Aortic valve regurgitation is not  visualized. No aortic stenosis is present.   6. The inferior vena cava is normal in size with greater than 50%  respiratory variability, suggesting right atrial pressure of 3 mmHg.   Echo 05/2020   1. The mid to distal anteroseptal wall is akinetic. The apex is akinetic  with some portions dyskinetic, there is no signs of apical thrombus with  use of echocontrast. The mid to distal inferoseptal wall is akinetic. Marland Kitchen  Left ventricular ejection fraction,   by estimation, is 35 to 40%. The left ventricle has moderately decreased  function. Left ventricular diastolic parameters are consistent with Grade  III diastolic dysfunction (restrictive). Elevated left atrial pressure.   2. Right ventricular systolic function is normal. The right ventricular  size is normal.   3. Left atrial size was mildly dilated.   4. The mitral valve is normal in structure. Mild mitral valve  regurgitation. No evidence of mitral stenosis.   5. The inferior vena cava is normal in size with greater than 50%  respiratory variability, suggesting right atrial pressure of 3 mmHg.   6. Limited echo to evaluate LV function and wall motion.   Myoview lexiscan  02/2020 No diagnostic ST segment changes to indicate ischemia. Large, severe intensity, periapical defect extending into the anterolateral wall and inferior septum that is largely fixed and consistent with infarct scar. There is a small region of apical anterolateral reversibility indicating a mild ischemic territory. This is a high risk study based on defect size although region is most consistent with scar and a mild region of peri-infarct ischemia. Nuclear stress EF: 48%. Large area of periapical akinesis.    Laboratory Data:  High Sensitivity Troponin:   Recent Labs  Lab 07/19/22 0533 07/19/22 0645 07/22/22 1736 07/22/22 1842 07/22/22 2110  TROPONINIHS 198* 187* 1,198* 1,199* 1,210*     Chemistry Recent Labs  Lab 07/21/22 0201 07/22/22 0232 07/23/22 0311  NA 136 131* 134*  K 4.1 5.0 4.0  CL 107 104 105  CO2 21* 16* 21*  GLUCOSE 107* 463* 97  BUN 45* 46* 42*  CREATININE 2.73* 2.74* 2.65*  CALCIUM 8.1* 8.0* 8.3*  GFRNONAA 22* 22* 23*  ANIONGAP 8 11 8     No results for input(s): "PROT", "ALBUMIN", "AST", "ALT", "ALKPHOS", "BILITOT" in the last 168 hours. Lipids No results for input(s): "CHOL", "TRIG", "HDL", "LABVLDL", "LDLCALC", "CHOLHDL" in the last 168 hours.  Hematology Recent Labs  Lab 07/20/22 0127 07/21/22 0201 07/22/22 0232  WBC 11.7* 11.2* 14.1*  RBC 2.90* 2.69* 3.16*  HGB 8.1* 7.7* 9.3*  HCT 25.8* 24.0* 27.8*  MCV 89.0 89.2 88.0  MCH 27.9 28.6 29.4  MCHC 31.4 32.1 33.5  RDW 14.0 13.8 13.8  PLT 446* 443* 588*   Thyroid No results for input(s): "TSH", "FREET4" in the last 168 hours.  BNPNo results for input(s): "BNP", "PROBNP" in the last 168 hours.  DDimer No results for input(s): "DDIMER" in the last 168 hours.   Radiology/Studies:  DG Foot Complete Left  Result Date: 07/21/2022 CLINICAL DATA:  Osteomyelitis.  Postoperative evaluation. EXAM: LEFT FOOT - COMPLETE 3+ VIEW COMPARISON:  Left foot radiographs  07/19/2022 and 01/30/2022 FINDINGS:  Interval amputation of the remaining prior 50% of the fifth metatarsal. Expected postoperative subcutaneous air. Subacute partially healed fracture of the distal shaft of the fourth metatarsal with partial healed sclerosis and moderate peripheral callus formation. On lateral view there appears to be approximately 6 mm dorsal displacement of the distal fracture component with respect to the proximal fracture component (slightly greater than 1 bone width). IMPRESSION: 1. Interval amputation of the remaining prior 50% of the fifth metatarsal. No unexpected findings. 2. Subacute partially healed and mildly displaced fracture of the distal shaft of the fourth metatarsal. Electronically Signed   By: Yvonne Kendall M.D.   On: 07/21/2022 17:35   VAS [name] LOWER EXTREMITY ARTERIAL DUPLEX  Result Date: 07/20/2022 LOWER EXTREMITY ARTERIAL DUPLEX STUDY Patient Name:  CRISLYN FROEHLE  Date of Exam:   07/20/2022 Medical Rec #: [id number]        Accession #:    [id number] Date of Birth: 1981-02-27        Patient Gender: F Patient Age:   25 years Exam Location:  [place] [facility] Procedure:      VAS [name] LOWER EXTREMITY ARTERIAL DUPLEX Referring Phys: [facility] --------------------------------------------------------------------------------  Indications: Peripheral artery disease, and Diabetic left foot ulcer. High Risk Factors: Diabetes, current smoker, coronary artery disease, prior CVA.  Vascular Interventions: 06/10/22 - Left anterior tibial and dorsalis pedis                         angioplasty with angioplasty and stenting of left mid                         SFA by Dr. Carlis Abbott. Current ABI:            Right 0.93, Left 0.88 Performing Technologist: Velva Harman Sturdivant RDMS, RVT  Examination Guidelines: A complete evaluation includes B-mode imaging, spectral Doppler, color Doppler, and power Doppler as needed of all accessible portions of each vessel. Bilateral testing is considered an integral part of a complete examination. Limited  examinations for reoccurring indications may be performed as noted.   Right Stent(s): +---------------+---++----------++ Prox to Stent  47monophasic +---------------+---++----------++ Proximal Stent 34monophasic +---------------+---++----------++ Mid Stent      3monophasic +---------------+---++----------++ Distal Stent   63monophasic +---------------+---++----------++ Distal to Stent61monophasic +---------------+---++----------++    +-----------+--------+-----+---------------+----------+------------------------+ LEFT       PSV cm/sRatioStenosis       Waveform  Comments                 +-----------+--------+-----+---------------+----------+------------------------+ CFA Mid    87                          monophasic                         +-----------+--------+-----+---------------+----------+------------------------+ CFA Distal 109                         monophasic                         +-----------+--------+-----+---------------+----------+------------------------+ DFA        90                          monophasic                         +-----------+--------+-----+---------------+----------+------------------------+  SFA Prox   135                                                            +-----------+--------+-----+---------------+----------+------------------------+ SFA Mid                                          Mid SFA Stent            +-----------+--------+-----+---------------+----------+------------------------+ SFA Distal 116                         monophasic                         +-----------+--------+-----+---------------+----------+------------------------+ POP Prox   151                         monophasic                         +-----------+--------+-----+---------------+----------+------------------------+ POP Distal 94                          monophasic                          +-----------+--------+-----+---------------+----------+------------------------+ TP Trunk   106                         monophasic                         +-----------+--------+-----+---------------+----------+------------------------+ ATA Prox   57                          monophasic                         +-----------+--------+-----+---------------+----------+------------------------+ ATA Mid    93                          monophasic                         +-----------+--------+-----+---------------+----------+------------------------+ ATA Distal 329          50-74% stenosismonophasic3.5 ratio                +-----------+--------+-----+---------------+----------+------------------------+ PTA Prox   27                          monophasicareas of occlusive                                                        disease with  collaterization          +-----------+--------+-----+---------------+----------+------------------------+ PTA Mid    53                          monophasic                         +-----------+--------+-----+---------------+----------+------------------------+ PTA Distal 58                          monophasic                         +-----------+--------+-----+---------------+----------+------------------------+ PERO Prox  70                          monophasic                         +-----------+--------+-----+---------------+----------+------------------------+ PERO Mid   64                          monophasic                         +-----------+--------+-----+---------------+----------+------------------------+ PERO Distal43                          monophasic                         +-----------+--------+-----+---------------+----------+------------------------+ DP         104                                                             +-----------+--------+-----+---------------+----------+------------------------+  Summary: Left: 50-74% stenosis noted in the anterior tibial artery. Patent left mid SFA stent.  See table(s) above for measurements and observations. Electronically signed by Deitra Mayo MD on 07/20/2022 at 4:41:09 PM.    Final    VAS [name] ABI WITH/WO TBI  Result Date: 07/20/2022  LOWER EXTREMITY DOPPLER STUDY Patient Name:  THELDA STOKKE  Date of Exam:   07/20/2022 Medical Rec #: [id number]        Accession #:    [id number] Date of Birth: 1980/11/28        Patient Gender: F Patient Age:   62 years Exam Location:  [facility] Procedure:      VAS [name] ABI WITH/WO TBI Referring Phys: Anderson Malta YATES --------------------------------------------------------------------------------  Indications: Peripheral artery disease. Left diabetic foot ulcer. High Risk Factors: Hyperlipidemia, Diabetes, current smoker, coronary artery                    disease.  Vascular Interventions: 06/10/22 - Left anterior tibial and dorsalis pedis                         angioplasty with angioplasty and stenting of left mid                         SFA by Dr. Carlis Abbott. Performing Technologist: Oda Cogan RDMS, RVT  Examination Guidelines: A complete evaluation includes at minimum, Doppler waveform signals and systolic blood pressure reading at the level of bilateral brachial, anterior tibial, and posterior tibial arteries, when vessel segments are accessible. Bilateral testing is considered an integral part of a complete examination. Photoelectric Plethysmograph (PPG) waveforms and toe systolic pressure readings are included as required and additional duplex testing as needed. Limited examinations for reoccurring indications may be performed as noted.  ABI Findings: +---------+------------------+-----+---------+----------------+ Right    Rt Pressure (mmHg)IndexWaveform Comment           +---------+------------------+-----+---------+----------------+ Brachial 131                    triphasic                 +---------+------------------+-----+---------+----------------+ ATA      255               1.95 biphasic Non compressible +---------+------------------+-----+---------+----------------+ PTA      123               0.94 biphasic                  +---------+------------------+-----+---------+----------------+ Great Toe88                0.67                           +---------+------------------+-----+---------+----------------+ +--------+------------------+-----+---------+----------------+ Left    Lt Pressure (mmHg)IndexWaveform Comment          +--------+------------------+-----+---------+----------------+ Brachial                       triphasic                 +--------+------------------+-----+---------+----------------+ PTA     115               0.88 biphasic                  +--------+------------------+-----+---------+----------------+ DP      213               1.63 biphasic Non compressible +--------+------------------+-----+---------+----------------+ +-------+-----------+-----------+------------+------------+ ABI/TBIToday's ABIToday's TBIPrevious ABIPrevious TBI +-------+-----------+-----------+------------+------------+ Right  0.94       0.67                                +-------+-----------+-----------+------------+------------+ Left   0.88       NA                                  +-------+-----------+-----------+------------+------------+  Left ABIs appear essentially unchanged compared to prior study on 01/14/22.  Summary: Right: Resting right ankle-brachial index indicates mild right lower extremity arterial disease. The right toe-brachial index is abnormal. Left: Resting left ankle-brachial index indicates mild left lower extremity arterial disease. *See table(s) above for measurements and observations.   Electronically signed by Deitra Mayo MD on 07/20/2022 at 4:40:44 PM.    Final      Assessment and Plan:   Elevated troponin/NSTEMI CAD  - HS troponin 187>198 on admission - repeat chest pain 3/22 with HS trop up to 1210 - EKG with no ischemic changes - LHC in 2015 showing 30% mLAD, moderate to severe disease along small D1, patent Lcx, moderate to severe distal OM2 stenosis and moderate diffuse disease along RCA  not amenable to PCI.  - Myoview lexiscan showed large severe intensity periapical defect extending into the anterolateral wall and inferior septum that is largely fixed, small region of reversibility consistent with ischemia, high risk study. - she has known reduced EF - echo ordered - PTA Lipitor 40mg  daily, Plavix 75mg  daily, Isosorbid mononitrate 20mg  BID, toprol 50mg  daily, Ranexa 500mg  BID, and warfarin - stop warfarin and start IV heparin - Not a good cath candidate due to CKD, baseline Scr around 2.65. Can consider Myoview Lexiscan. Will discuss with MD.   Acute osteomyelitis s/p excision of left fifth metatarsal and bone biopsy of fourth metatarsal on 3/20 Left diabetic foot ulcer - abx per IM - podiatry following  HFrEF ICM - EF newly reduced in 2021, unable to do cath due to CKD - echo ordered - lower leg edema, worse on the left - PTA lasix 40mg  as needed for swelling  PAD - s/p angioplasty with stent placement on 2/8 - continue plavix  H/o CVA s/p thrombectomy 2023 H/o right upper extrmity ischemia July 2023 secondary to a brachial artery embolus s/p thrombectomy - suspected embolic events on coumadin  CKD stage 4 - at baseline around 2.6  For questions or updates, please contact Success Please consult [email or web address] for contact info under    Signed, Cadence Ninfa Meeker, PA-C  07/23/2022 11:16 AM   Personally seen and examined. Agree with above.  42 year old female with multiple comorbidities.  Non-ST elevation myocardial  infarction.  Chest pain elevated troponin.  She has had active anginal symptoms for quite some time.  Back in September 2023 troponin elevation to 3000.  Wound debridement foot.  Creatinine 2.6.  [place]. Carlyle Dolly in [place].  Ejection fraction 35%.  Has been managed medically without intervention from a cardiac perspective but now continues to have anginal symptoms.  Current non-STEMI. - Stop warfarin - Start IV heparin -N.p.o. Sunday midnight - Plan on cardiac catheterization on Monday.  We discussed risks and benefits including stroke heart attack death renal impairment bleeding.  She is willing to proceed.  Husband present for discussion.  She understands that with her creatinine of 2.6 that she is at risk for further renal worsening. - She also has potential dye allergy as well, had vomiting during prior peripheral vascular procedure.  We will go ahead and premedicate as well with steroids etc.  She is concerned about her diabetes.  Reassured her that prednisone would be only a few doses. - We will also hydrate her gently prior to catheterization on Monday.  I have also spoken to interventional cardiologist, Dr. Peter Martinique regarding her case.  We will use dye sparing procedure. - Our concern is that she may have severe multivessel coronary artery disease given her ongoing symptoms and recurrent myocardial infarctions.  Expressed to her that she is at very high risk for future morbidity and mortality.  I think given the symptoms it makes sense for [name] to understand her coronary anatomy.  Candee Furbish, MD

## 2022-07-23 NOTE — Progress Notes (Addendum)
PROGRESS NOTE        PATIENT DETAILS Name: Lauren [name] Age: 42 y.o. Sex: female Date of Birth: 21-Sep-1980 Admit Date: 07/19/2022 Admitting Physician Karmen Bongo, MD [id number], [name], MD  Brief Summary: Patient is a 42 y.o.  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   Significant events: 3/18>> admit to [facility] 3/20>> left fifth metatarsal excision by podiatry 3/21>> back on insulin pump, chest pain with elevated troponins.  Significant studies: 3/18>> MRI left foot: Acute osteomyelitis of residual fifth metatarsal 3/18>> CXR: No PNA  Significant microbiology data: 3/18>> blood culture: No growth 3/20>> left fourth metatarsal: No growth  Procedures: 3/20>> left fifth metatarsal excision, bone biopsy of fourth metatarsal.  Consults: Podiatry Vascular surgery ID Cards  Subjective: Lying comfortable in bed-no chest pain overnight.  No nausea or vomiting.  Objective: Vitals: Blood pressure (!) 154/105, pulse 86, temperature 98.1 F (36.7 C), temperature source Oral, resp. rate 18, height 5\' 5"  (1.651 m), weight 88.5 kg, last menstrual period 07/18/2022, SpO2 100 %.   Exam: Gen Exam:Alert awake-not in any distress HEENT:atraumatic, normocephalic Chest: B/L clear to auscultation anteriorly CVS:S1S2 regular Abdomen:soft non tender, non distended Extremities:no edema.  Left foot remains to bandaged.  See picture taken by vascular surgery 3/21. Neurology: Non focal Skin: no rash  Pertinent Labs/Radiology:    Latest Ref Rng & Units 07/22/2022    2:32 AM 07/21/2022    2:01 AM 07/20/2022    1:27 AM  CBC  WBC 4.0 - 10.5 K/uL 14.1  11.2  11.7    Hemoglobin 12.0 - 15.0 g/dL 9.3  7.7  8.1   Hematocrit 36.0 - 46.0 % 27.8  24.0  25.8   Platelets 150 - 400 K/uL 588  443  446     Lab Results  Component Value Date   NA 134 (L) 07/23/2022   K 4.0 07/23/2022   CL 105 07/23/2022   CO2 21 (L) 07/23/2022     Assessment/Plan: Left diabetic foot with Acute osteomyelitis of fifth metatarsal-s/p excision of left fifth metatarsal  and bone biopsy of fourth metatarsal on 3/20 Left fourth metatarsal culture negative so far Initially on IV antibiotics-evaluated by ID and switched to Doxy/Augmentin  Podiatry following-nonweightbearing to left lower extremity.  Chest pain-elevated troponins-non-STEMI versus demand ischemia Known CAD-last LHC 2015-medical management Check echo Already on Plavix/statin/beta-blocker/Ranexa and has been on therapeutic Lovenox for the past several days. Await cardiology input No further chest pain since 3/21 evening.  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 Suspicion that both of these events were embolic-hence maintained on Coumadin prior to this hospitalization Coumadin reversed with vitamin K-postoperatively-restarted on Coumadin /Lovenox bridge.  INR slowly creeping back up. Note-extensive prior workup including hypercoagulable workup negative  Reviewed outpatient neurology note-no documentation of A-fib-Per neurology-no  anticoagulation from their point of view, however given to embolic events-hematology has maintained patient on anticoagulation.  This MD spoke with primary hematologist Dr Chryl Heck on 3/19-she has arranged for a second opinion at [facility] hematology.  Plan is to continue full anticoagulation until seen by hematology for a second opinion.  CKD stage IV Close to baseline Follow electrolytes Avoid nephrotoxic agents  Normocytic anemia Baseline anemia due to CKD-likely worsened due to acute illness No evidence of blood loss Follow-and transfuse if Hb <7  DM-2 (A1C 7.0 on 3/18) with uncontrolled hyperglycemia Insulin pump was discontinued and patient needed an MRI-postoperatively on 3/21-she was placed back on her insulin pump.  Continue to watch CBGs closely.  Overall better controlled-per patient-she does not do well with basal/bolus regimen.  Recent Labs    07/22/22 2343 07/23/22 0244 07/23/22 0733  GLUCAP 58* 106* 111*     HLD Statin  Hypothyroidism Synthroid  Obesity: Estimated body mass index is 32.45 kg/m as calculated from the following:   Height as of this encounter: 5\' 5"  (1.651 m).   Weight as of this encounter: 88.5 kg.   Code status:   Code Status: Full Code (3/20>> wanted to revoke DNR-full code order entered)  DVT Prophylaxis:    Family Communication: None at bedside   Disposition Plan: Status is: Inpatient Remains inpatient appropriate because: Severity of illness   Planned Discharge Destination:Home health over the next several days.   Diet: Diet Order             Diet heart healthy/carb modified Room service appropriate? Yes; Fluid consistency: Thin  Diet effective now                     Antimicrobial agents: Anti-infectives (From admission, onward)    Start     Dose/Rate Route Frequency Ordered Stop   07/22/22 1215  doxycycline (VIBRA-TABS) tablet 100 mg        100 mg Oral Every 12 hours 07/22/22 1119     07/22/22 1215   amoxicillin-clavulanate (AUGMENTIN) 500-125 MG per tablet 1 tablet        1 tablet Oral 2 times daily 07/22/22 1119     07/21/22 1000  vancomycin (VANCOCIN) IVPB 1000 mg/200 mL premix  Status:  Discontinued        1,000 mg 200 mL/hr over 60 Minutes Intravenous Every 48 hours 07/19/22 1003 07/22/22 1119   07/19/22 1415  metroNIDAZOLE (FLAGYL) IVPB 500 mg  Status:  Discontinued        500 mg 100 mL/hr over 60 Minutes Intravenous Every 12 hours 07/19/22 1413 07/22/22 1119   07/19/22 1015  vancomycin (VANCOREADY) IVPB 2000 mg/400 mL        2,000 mg 200 mL/hr over 120 Minutes Intravenous  Once 07/19/22 1003 07/19/22 1321   07/19/22 1015  ceFEPIme (MAXIPIME) 2 g in sodium chloride 0.9 % 100 mL IVPB  Status:  Discontinued        2 g 200 mL/hr over 30 Minutes Intravenous Every 12 hours 07/19/22 1003 07/22/22 1119        MEDICATIONS: Scheduled Meds:  amoxicillin-clavulanate  1 tablet Oral BID   ascorbic acid  500 mg Oral Daily   atorvastatin  40 mg Oral Daily   Chlorhexidine Gluconate Cloth  6 each Topical Q0600   clopidogrel  75 mg Oral Daily   docusate sodium  100 mg Oral BID   doxycycline  100 mg Oral  Q12H   enoxaparin (LOVENOX) injection  90 mg Subcutaneous Q12H   hydrALAZINE  12.5 mg Oral BID   insulin pump   Subcutaneous TID WC, HS, 0200   isosorbide mononitrate  20 mg Oral BID   levothyroxine  125 mcg Oral Q0600   magnesium oxide  400 mg Oral Daily   metoprolol succinate  50 mg Oral Daily   multivitamin with minerals  1 tablet Oral Daily   mupirocin ointment  1 Application Nasal BID   pantoprazole  40 mg Oral Daily   ranolazine  500 mg Oral BID   tiZANidine  4 mg Oral QHS   traZODone  50 mg Oral QHS   Warfarin - Pharmacist Dosing Inpatient   Does not apply q1600   zinc sulfate  220 mg Oral Daily   Continuous Infusions:   PRN Meds:.acetaminophen **OR** acetaminophen, alum & mag hydroxide-simeth, bisacodyl, hydrALAZINE, ondansetron **OR** ondansetron (ZOFRAN) IV, oxyCODONE,  polyethylene glycol, prochlorperazine   I have personally reviewed following labs and imaging studies  LABORATORY DATA: CBC: Recent Labs  Lab 07/19/22 0533 07/20/22 0127 07/21/22 0201 07/22/22 0232  WBC 13.6* 11.7* 11.2* 14.1*  HGB 9.1* 8.1* 7.7* 9.3*  HCT 28.0* 25.8* 24.0* 27.8*  MCV 89.5 89.0 89.2 88.0  PLT 506* 446* 443* 588*     Basic Metabolic Panel: Recent Labs  Lab 07/19/22 0533 07/20/22 0127 07/21/22 0201 07/22/22 0232 07/23/22 0311  NA 134* 134* 136 131* 134*  K 3.8 4.4 4.1 5.0 4.0  CL 102 102 107 104 105  CO2 22 18* 21* 16* 21*  GLUCOSE 114* 173* 107* 463* 97  BUN 35* 41* 45* 46* 42*  CREATININE 2.68* 2.80* 2.73* 2.74* 2.65*  CALCIUM 8.1* 8.1* 8.1* 8.0* 8.3*     GFR: Estimated Creatinine Clearance: 31 mL/min (A) (by C-G formula based on SCr of 2.65 mg/dL (H)).  Liver Function Tests: No results for input(s): "AST", "ALT", "ALKPHOS", "BILITOT", "PROT", "ALBUMIN" in the last 168 hours. No results for input(s): "LIPASE", "AMYLASE" in the last 168 hours. No results for input(s): "AMMONIA" in the last 168 hours.  Coagulation Profile: Recent Labs  Lab 07/19/22 0635 07/20/22 0127 07/21/22 0201 07/22/22 0232 07/23/22 0311  INR 2.6* 3.4* 1.5* 1.2 1.8*     Cardiac Enzymes: No results for input(s): "CKTOTAL", "CKMB", "CKMBINDEX", "TROPONINI" in the last 168 hours.  BNP (last 3 results) No results for input(s): "PROBNP" in the last 8760 hours.  Lipid Profile: No results for input(s): "CHOL", "HDL", "LDLCALC", "TRIG", "CHOLHDL", "LDLDIRECT" in the last 72 hours.  Thyroid Function Tests: No results for input(s): "TSH", "T4TOTAL", "FREET4", "T3FREE", "THYROIDAB" in the last 72 hours.  Anemia Panel: No results for input(s): "VITAMINB12", "FOLATE", "FERRITIN", "TIBC", "IRON", "RETICCTPCT" in the last 72 hours.  Urine analysis:    Component Value Date/Time   COLORURINE YELLOW 01/09/2022 0225   APPEARANCEUR CLEAR 01/09/2022 0225   LABSPEC 1.045  (H) 01/09/2022 0225   PHURINE 7.0 01/09/2022 0225   GLUCOSEU 50 (A) 01/09/2022 0225   HGBUR NEGATIVE 01/09/2022 0225   BILIRUBINUR NEGATIVE 01/09/2022 0225   KETONESUR NEGATIVE 01/09/2022 0225   PROTEINUR >=300 (A) 01/09/2022 0225   UROBILINOGEN 0.2 06/23/2014 1227   NITRITE NEGATIVE 01/09/2022 0225   LEUKOCYTESUR TRACE (A) 01/09/2022 0225    Sepsis Labs: Lactic Acid, Venous    Component Value Date/Time   LATICACIDVEN 0.7 07/19/2022 0918    MICROBIOLOGY: Recent Results (from the past 240 hour(s))  Blood culture (routine x 2)     Status: None (  Preliminary result)   Collection Time: 07/19/22  9:14 AM   Specimen: BLOOD RIGHT FOREARM  Result Value Ref Range Status   Specimen Description BLOOD RIGHT FOREARM  Final   Special Requests   Final    BOTTLES DRAWN AEROBIC AND ANAEROBIC Blood Culture adequate volume   Culture   Final    NO GROWTH 3 DAYS Performed at [facility], [address] [place], [place] [address]    Report Status PENDING  Incomplete  Blood culture (routine x 2)     Status: None (Preliminary result)   Collection Time: 07/19/22  9:29 AM   Specimen: BLOOD RIGHT HAND  Result Value Ref Range Status   Specimen Description BLOOD RIGHT HAND  Final   Special Requests   Final    BOTTLES DRAWN AEROBIC AND ANAEROBIC Blood Culture adequate volume   Culture   Final    NO GROWTH 3 DAYS Performed at [facility], [place] [address] [place], [place] [address]    Report Status PENDING  Incomplete  Surgical PCR screen     Status: Abnormal   Collection Time: 07/19/22 11:00 PM   Specimen: Nasal Mucosa; Nasal Swab  Result Value Ref Range Status   MRSA, PCR POSITIVE (A) NEGATIVE Final    Comment: RESULT CALLED TO, READ BACK BY AND VERIFIED WITH: C ALLEN,RN@0059  07/20/22 [place]    Staphylococcus aureus POSITIVE (A) NEGATIVE Final    Comment: (NOTE) The Xpert SA Assay (FDA approved for NASAL specimens in patients 105 years of age and older), is one component of a  comprehensive surveillance program. It is not intended to diagnose infection nor to guide or monitor treatment. Performed at [facility], [place] [address], [place], [place] [address]   Aerobic/Anaerobic Culture w Gram Stain (surgical/deep wound)     Status: None (Preliminary result)   Collection Time: 07/21/22  4:38 PM   Specimen: PATH Bone biopsy; Tissue  Result Value Ref Range Status   Specimen Description BIOPSY  Final   Special Requests LEFT 4TH METATARSAL TWO  Final   Gram Stain NO WBC SEEN NO ORGANISMS SEEN   Final   Culture   Final    NO GROWTH 2 DAYS Performed at [facility], [address], [place], [place] [address]    Report Status PENDING  Incomplete    RADIOLOGY STUDIES/RESULTS: DG Foot Complete Left  Result Date: 07/21/2022 CLINICAL DATA:  Osteomyelitis.  Postoperative evaluation. EXAM: LEFT FOOT - COMPLETE 3+ VIEW COMPARISON:  Left foot radiographs 07/19/2022 and 01/30/2022 FINDINGS: Interval amputation of the remaining prior 50% of the fifth metatarsal. Expected postoperative subcutaneous air. Subacute partially healed fracture of the distal shaft of the fourth metatarsal with partial healed sclerosis and moderate peripheral callus formation. On lateral view there appears to be approximately 6 mm dorsal displacement of the distal fracture component with respect to the proximal fracture component (slightly greater than 1 bone width). IMPRESSION: 1. Interval amputation of the remaining prior 50% of the fifth metatarsal. No unexpected findings. 2. Subacute partially healed and mildly displaced fracture of the distal shaft of the fourth metatarsal. Electronically Signed   By: Yvonne Kendall M.D.   On: 07/21/2022 17:35     LOS: 4 days   Oren Binet, MD  [facility]    To contact the attending provider between 7A-7P or the covering provider during after hours 7P-7A, please log into the web site [email or web address] and access using universal [place]  password for  that web site. If you do not have the password, please call the hospital operator.  07/23/2022, 9:23 AM

## 2022-07-23 NOTE — Progress Notes (Addendum)
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 CO2 used during procedure prior to contrast being used, this may be cause as well. Recommended premedicating prior to contrast being used in the future.    Wellbutrin [Bupropion] Hives   Cefepime Rash    Tolerates penicilllin   Ciprofloxacin Hcl Hives and Rash    Hives/rash at injection site; 01/15/22 tolerated IV cipro   Tape Rash    Patient Measurements: Height: 5\' 5"  (165.1 cm) Weight: 88.5 kg (195 lb) IBW/kg (Calculated) : 57  Vital Signs: Temp: 98 F (36.7 C) (03/22 0447) Temp Source: Oral (03/22 0447) BP: 143/101 (03/22 0447) Pulse Rate: 81 (03/21 2028)  Labs: Recent Labs    07/21/22 0201 07/22/22 0232 07/22/22 1736 07/22/22 1842 07/22/22 2110 07/23/22 0311  HGB 7.7* 9.3*  --   --   --   --   HCT 24.0* 27.8*  --   --   --   --   PLT 443* 588*  --   --   --   --   LABPROT 18.4* 15.2  --   --   --  20.3*  INR 1.5* 1.2  --   --   --  1.8*  CREATININE 2.73* 2.74*  --   --   --  2.65*  TROPONINIHS  --   --  1,198* 1,199* 1,210*  --      Estimated Creatinine Clearance: 31 mL/min (A) (by C-G formula based on SCr of 2.65 mg/dL (H)).   Medical History: Past Medical History:  Diagnosis Date   Anemia    CAD (coronary artery disease)    a. s/p cath in 03/2014 showing 30% mid-LAD, moderate to severe disease along small D1, patent LCx, moderate to severe distal OM2 stenosis and moderate diffuse diease along RCA not amenable to PCI   CHF (congestive heart failure) ([place])    a. EF 55-60% in 12/2019 b. EF at 35-40% by echo in 05/2020   CKD (chronic kidney disease) stage 4, GFR 15-29 ml/min (HCC)    Diabetes mellitus without  complication (HCC)    Myocardial infarction ([place])    Neuropathy    PVD (peripheral vascular disease) ([place])    Stroke ([place]) 01/2022   L hand weakness    Medications:  Medications Prior to Admission  Medication Sig Dispense Refill Last Dose   acetaminophen (TYLENOL) 325 MG tablet Take 1-2 tablets (325-650 mg total) by mouth every 4 (four) hours as needed for mild pain. (Patient taking differently: Take 650 mg by mouth as needed for mild pain.) 100 tablet 0 Past Week   atorvastatin (LIPITOR) 40 MG tablet Take 1 tablet (40 mg total) by mouth daily. Courtesy fill/ pt to get established with a primary MD for further refills 90 tablet 1 Past Week   clopidogrel (PLAVIX) 75 MG tablet Take 1 tablet (75 mg total) by mouth daily. 30 tablet 11 07/18/2022 at 0800   diclofenac Sodium (VOLTAREN ARTHRITIS PAIN) 1 % GEL Apply 2 g topically 4 (four) times daily. (Patient taking differently: Apply 1 Application topically 2 (two) times daily as needed (pain).) 150 g 5 Past Week   furosemide (LASIX) 40 MG tablet Take 40 mg daily as needed for swelling 90 tablet 3 07/17/2022  hydrALAZINE (APRESOLINE) 25 MG tablet Take 0.5 tablets (12.5 mg total) by mouth 2 (two) times daily. 30 tablet 6 Past Week   ibuprofen (ADVIL) 200 MG tablet Take 400 mg by mouth as needed for moderate pain or mild pain.   unknown   insulin aspart (NOVOLOG) 100 UNIT/ML injection Use with Omnipod for daily dose around 80 units daily 60 mL 3 07/19/2022   Insulin Disposable Pump (OMNIPOD 5 G6 PODS, GEN 5,) MISC Change pod every 48-72 hours 6 each 3 07/19/2022   isosorbide mononitrate (ISMO) 20 MG tablet Take 1 tablet (20 mg total) by mouth 2 (two) times daily. Courtesy fill/ pt to get established with a primary MD for further refills 180 tablet 1 Past Week   levothyroxine (SYNTHROID) 125 MCG tablet Take 1 tablet (125 mcg total) by mouth daily at 6 (six) AM. 90 tablet 3 07/18/2022   magnesium oxide (MAG-OX) 400 MG tablet Take 0.5 tablets (200 mg total)  by mouth at bedtime. (Patient taking differently: Take 400 mg by mouth 3 (three) times a week.) 15 tablet 0 Past Week   Multiple Vitamin (MULTIVITAMIN WITH MINERALS) TABS tablet Take 1 tablet by mouth daily.   Past Week   nitroGLYCERIN (NITROSTAT) 0.4 MG SL tablet Place 1 tablet (0.4 mg total) under the tongue every 5 (five) minutes x 3 doses as needed for chest pain. 25 tablet 3 Past Month   ondansetron (ZOFRAN) 4 MG tablet Take 1 tablet (4 mg total) by mouth every 8 (eight) hours as needed for nausea or vomiting. 90 tablet 0 07/18/2022   oxyCODONE (OXY IR/ROXICODONE) 5 MG immediate release tablet Take 1 tablet (5 mg total) by mouth every 6 (six) hours as needed for severe pain. 20 tablet 0 07/18/2022   pantoprazole (PROTONIX) 40 MG tablet Take 1 tablet (40 mg total) by mouth daily. Courtesy fill/ pt to get established with a primary MD for further refills (Patient taking differently: Take 40 mg by mouth daily as needed (reflux).) 90 tablet 1 Past Week   polyethylene glycol (MIRALAX / GLYCOLAX) 17 g packet Take 17 g by mouth 2 (two) times daily. (Patient taking differently: Take 17 g by mouth as needed for moderate constipation.) 60 each 0 unknown   ranolazine (RANEXA) 500 MG 12 hr tablet Take 1 tablet (500 mg total) by mouth 2 (two) times daily. Courtesy fill/ pt to get established with a primary MD for further refills 60 tablet 2 Past Week   SANTYL 250 UNIT/GM ointment Apply 1 Application topically daily.   Past Week   tiZANidine (ZANAFLEX) 4 MG tablet Take 1 tablet (4 mg total) by mouth at bedtime. Courtesy fill/ pt to get established with a primary MD for further refills 30 tablet 2 Past Week   traZODone (DESYREL) 50 MG tablet Take 1 tablet (50 mg total) by mouth at bedtime. 30 tablet 2 Past Week   amoxicillin-clavulanate (AUGMENTIN) 875-125 MG tablet Take 1 tablet by mouth 2 (two) times daily. (Patient not taking: Reported on 07/20/2022) 20 tablet 0 Completed Course   Continuous Blood Gluc Sensor  (DEXCOM G6 SENSOR) MISC Change sensor every 10 days as directed 9 each 3    enoxaparin (LOVENOX) 150 MG/ML injection Inject 1 mL (150 mg total) into the skin daily for 10 days. (Patient not taking: Reported on 07/20/2022) 10 mL 0 Completed Course   Insulin Disposable Pump (OMNIPOD 5 G6 PODS, GEN 5,) MISC Change pod every 48-72 hours 6 each 3    methocarbamol (ROBAXIN) 500 MG  tablet Take 1 tablet (500 mg total) by mouth every 6 (six) hours as needed for muscle spasms. (Patient not taking: Reported on 07/20/2022) 60 tablet 0 Not Taking   metoprolol succinate (TOPROL XL) 50 MG 24 hr tablet Take 1 tablet (50 mg total) by mouth daily. 30 tablet 6 07/18/2022 at 0800   ramelteon (ROZEREM) 8 MG tablet Take 1 tablet (8 mg total) by mouth at bedtime. (Patient not taking: Reported on 07/20/2022) 30 tablet 0 Not Taking   [EXPIRED] Vitamin D, Ergocalciferol, (DRISDOL) 1.25 MG (50000 UNIT) CAPS capsule Take 1 capsule (50,000 Units total) by mouth every 7 (seven) days for 8 doses. (Patient not taking: Reported on 07/20/2022) 8 capsule 0 Completed Course   warfarin (COUMADIN) 5 MG tablet TAKE ONE TABLET BY MOUTH DAILY EXCEPT 1 AND 1/2 TABLET ON WEDNESDAYS AND SATURDAYS OR AS DIRECTED (Patient taking differently: Take 5-7.5 mg by mouth every evening. TAKE ONE TABLET BY MOUTH DAILY EXCEPT 1 AND 1/2 TABLET ON WEDNESDAYS AND SATURDAYS OR AS DIRECTED) 40 tablet 5 07/18/2022 at 1900    Assessment: 42 yo F presents with L foot osteomyelitis on 07/19/22. Pt has a history of stroke w/ aterial thrombus while on apixaban therapy. Pt was transitioned to warfarin for this reason per hematology and neurology. INR on admission was therapeutic at 2.6 with last warfarin dose of 5mg  given on 3/17 @ 1900. INR trended up to 3.4 on 3/19 and Vitamin K 5mg  IV given at 11:38 on 3/19 to reverse INR for surgery. Pt underwent L foot 5th metatarsal excision and debridement with 4th metatarsal bone biopsy on 07/21/22. Pharmacy consulted to resume home  warfarin and bridge with enoxaparin until INR therapeutic starting on 07/21/22. No further plans for surgical intervention at this time, per Dr. Jacqualyn Posey.  PTA warfarin regimen: warfarin 5mg  daily except for 7.5mg  on Wed and Sat  Large increase in INR today from yesterday from 1.2 >> 1.8. CrCl > 30, will adj enoxaparin to q12. Received warfarin 7.5mg  on 3/20 and 3/21  Goal of Therapy:  INR 2-3 Monitor platelets by anticoagulation protocol: Yes   Plan:  Given large jump in INR will hold dose today since got boosted dose yesterday Continue enoxaparin 90mg  [place] q24h until INR is >/= 2 but adjust to q12h  Monitor renal function and adjust enoxaparin as necessary Monitor daily CBC, INR, and for s/sx of bleeding post-op  Dimple Nanas, PharmD, BCPS 07/23/2022 7:20 AM

## 2022-07-23 NOTE — Progress Notes (Signed)
Left SFA stents widely patent after recent revascularization on 06/10/22.  Patient has undergone additional toe amputation and debridement with podiatry.  Please call vascular if any questions or concerns.  Will arrange follow-up in 1 month in the office.  Marty Heck, MD [facility] Office: [place]

## 2022-07-23 NOTE — Progress Notes (Addendum)
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 CO2 used during procedure prior to contrast being used, this may be cause as well. Recommended premedicating prior to contrast being used in the future.    Wellbutrin [Bupropion] Hives   Cefepime Rash    Tolerates penicilllin   Ciprofloxacin Hcl Hives and Rash    Hives/rash at injection site; 01/15/22 tolerated IV cipro   Tape Rash    Patient Measurements: Height: 5\' 5"  (165.1 cm) Weight: 88.5 kg (195 lb) IBW/kg (Calculated) : 57 Heparin dosing weight: 76.4 kg  Vital Signs: Temp: 98.1 F (36.7 C) (03/22 1200) Temp Source: Oral (03/22 1200) BP: 144/97 (03/22 1200) Pulse Rate: 87 (03/22 1200)  Labs: Recent Labs    07/21/22 0201 07/22/22 0232 07/22/22 1736 07/22/22 1842 07/22/22 2110 07/23/22 0311  HGB 7.7* 9.3*  --   --   --   --   HCT 24.0* 27.8*  --   --   --   --   PLT 443* 588*  --   --   --   --   LABPROT 18.4* 15.2  --   --   --  20.3*  INR 1.5* 1.2  --   --   --  1.8*  CREATININE 2.73* 2.74*  --   --   --  2.65*  TROPONINIHS  --   --  1,198* 1,199* 1,210*  --      Estimated Creatinine Clearance: 31 mL/min (A) (by C-G formula based on SCr of 2.65 mg/dL (H)).   Medical History: Past Medical History:  Diagnosis Date   Anemia    CAD (coronary artery disease)    a. s/p cath in 03/2014 showing 30% mid-LAD, moderate to severe disease along small D1, patent LCx, moderate to severe distal OM2 stenosis and moderate diffuse diease along RCA not amenable to PCI   CHF (congestive heart failure) ([place])    a. EF 55-60% in 12/2019 b. EF at 35-40% by echo in 05/2020   CKD (chronic kidney disease) stage 4, GFR 15-29 ml/min  (HCC)    Diabetes mellitus without complication (HCC)    Myocardial infarction ([place])    Neuropathy    PVD (peripheral vascular disease) ([place])    Stroke ([place]) 01/2022   L hand weakness    Medications:  Medications Prior to Admission  Medication Sig Dispense Refill Last Dose   acetaminophen (TYLENOL) 325 MG tablet Take 1-2 tablets (325-650 mg total) by mouth every 4 (four) hours as needed for mild pain. (Patient taking differently: Take 650 mg by mouth as needed for mild pain.) 100 tablet 0 Past Week   atorvastatin (LIPITOR) 40 MG tablet Take 1 tablet (40 mg total) by mouth daily. Courtesy fill/ pt to get established with a primary MD for further refills 90 tablet 1 Past Week   clopidogrel (PLAVIX) 75 MG tablet Take 1 tablet (75 mg total) by mouth daily. 30 tablet 11 07/18/2022 at 0800   diclofenac Sodium (VOLTAREN ARTHRITIS PAIN) 1 % GEL Apply 2 g topically 4 (four) times daily. (Patient taking differently: Apply 1 Application topically 2 (two) times daily as needed (pain).) 150 g 5 Past Week   furosemide (LASIX) 40 MG tablet Take 40 mg daily as needed for swelling 90  tablet 3 07/17/2022   hydrALAZINE (APRESOLINE) 25 MG tablet Take 0.5 tablets (12.5 mg total) by mouth 2 (two) times daily. 30 tablet 6 Past Week   ibuprofen (ADVIL) 200 MG tablet Take 400 mg by mouth as needed for moderate pain or mild pain.   unknown   insulin aspart (NOVOLOG) 100 UNIT/ML injection Use with Omnipod for daily dose around 80 units daily 60 mL 3 07/19/2022   Insulin Disposable Pump (OMNIPOD 5 G6 PODS, GEN 5,) MISC Change pod every 48-72 hours 6 each 3 07/19/2022   isosorbide mononitrate (ISMO) 20 MG tablet Take 1 tablet (20 mg total) by mouth 2 (two) times daily. Courtesy fill/ pt to get established with a primary MD for further refills 180 tablet 1 Past Week   levothyroxine (SYNTHROID) 125 MCG tablet Take 1 tablet (125 mcg total) by mouth daily at 6 (six) AM. 90 tablet 3 07/18/2022   magnesium oxide (MAG-OX) 400 MG  tablet Take 0.5 tablets (200 mg total) by mouth at bedtime. (Patient taking differently: Take 400 mg by mouth 3 (three) times a week.) 15 tablet 0 Past Week   Multiple Vitamin (MULTIVITAMIN WITH MINERALS) TABS tablet Take 1 tablet by mouth daily.   Past Week   nitroGLYCERIN (NITROSTAT) 0.4 MG SL tablet Place 1 tablet (0.4 mg total) under the tongue every 5 (five) minutes x 3 doses as needed for chest pain. 25 tablet 3 Past Month   ondansetron (ZOFRAN) 4 MG tablet Take 1 tablet (4 mg total) by mouth every 8 (eight) hours as needed for nausea or vomiting. 90 tablet 0 07/18/2022   oxyCODONE (OXY IR/ROXICODONE) 5 MG immediate release tablet Take 1 tablet (5 mg total) by mouth every 6 (six) hours as needed for severe pain. 20 tablet 0 07/18/2022   pantoprazole (PROTONIX) 40 MG tablet Take 1 tablet (40 mg total) by mouth daily. Courtesy fill/ pt to get established with a primary MD for further refills (Patient taking differently: Take 40 mg by mouth daily as needed (reflux).) 90 tablet 1 Past Week   polyethylene glycol (MIRALAX / GLYCOLAX) 17 g packet Take 17 g by mouth 2 (two) times daily. (Patient taking differently: Take 17 g by mouth as needed for moderate constipation.) 60 each 0 unknown   ranolazine (RANEXA) 500 MG 12 hr tablet Take 1 tablet (500 mg total) by mouth 2 (two) times daily. Courtesy fill/ pt to get established with a primary MD for further refills 60 tablet 2 Past Week   SANTYL 250 UNIT/GM ointment Apply 1 Application topically daily.   Past Week   tiZANidine (ZANAFLEX) 4 MG tablet Take 1 tablet (4 mg total) by mouth at bedtime. Courtesy fill/ pt to get established with a primary MD for further refills 30 tablet 2 Past Week   traZODone (DESYREL) 50 MG tablet Take 1 tablet (50 mg total) by mouth at bedtime. 30 tablet 2 Past Week   amoxicillin-clavulanate (AUGMENTIN) 875-125 MG tablet Take 1 tablet by mouth 2 (two) times daily. (Patient not taking: Reported on 07/20/2022) 20 tablet 0 Completed  Course   Continuous Blood Gluc Sensor (DEXCOM G6 SENSOR) MISC Change sensor every 10 days as directed 9 each 3    enoxaparin (LOVENOX) 150 MG/ML injection Inject 1 mL (150 mg total) into the skin daily for 10 days. (Patient not taking: Reported on 07/20/2022) 10 mL 0 Completed Course   Insulin Disposable Pump (OMNIPOD 5 G6 PODS, GEN 5,) MISC Change pod every 48-72 hours 6 each 3  methocarbamol (ROBAXIN) 500 MG tablet Take 1 tablet (500 mg total) by mouth every 6 (six) hours as needed for muscle spasms. (Patient not taking: Reported on 07/20/2022) 60 tablet 0 Not Taking   metoprolol succinate (TOPROL XL) 50 MG 24 hr tablet Take 1 tablet (50 mg total) by mouth daily. 30 tablet 6 07/18/2022 at 0800   ramelteon (ROZEREM) 8 MG tablet Take 1 tablet (8 mg total) by mouth at bedtime. (Patient not taking: Reported on 07/20/2022) 30 tablet 0 Not Taking   [EXPIRED] Vitamin D, Ergocalciferol, (DRISDOL) 1.25 MG (50000 UNIT) CAPS capsule Take 1 capsule (50,000 Units total) by mouth every 7 (seven) days for 8 doses. (Patient not taking: Reported on 07/20/2022) 8 capsule 0 Completed Course   warfarin (COUMADIN) 5 MG tablet TAKE ONE TABLET BY MOUTH DAILY EXCEPT 1 AND 1/2 TABLET ON WEDNESDAYS AND SATURDAYS OR AS DIRECTED (Patient taking differently: Take 5-7.5 mg by mouth every evening. TAKE ONE TABLET BY MOUTH DAILY EXCEPT 1 AND 1/2 TABLET ON WEDNESDAYS AND SATURDAYS OR AS DIRECTED) 40 tablet 5 07/18/2022 at 1900    Assessment: 42 yo F presents with L foot osteomyelitis on 07/19/22. Pt has a history of stroke w/ aterial thrombus while on apixaban therapy and was transitioned to warfarin for this reason per hematology and neurology. Warfarin was reversed w/ IV vitamin K 5mg  on 3/19 for INR 3.4. S/p metatarsal excision and debridement w/ bone biopsy on 3/20 and pharmacy was consulted to dose warfarin w/ enoxaparin bridge that evening. Last dose of warfarin given on 3/21. Last dose of enoxaparin 3/22 @ 0900. Pharmacy consulted  to transition warfarin/enoxaparin to IV heparin for new ACS/NSTEMI pending work-up.   PTA warfarin regimen: warfarin 5mg  daily except for 7.5mg  on Wed and Sat  INR today 1.8. CBC stable.   Goal of Therapy:  INR 2-3 Heparin level 0.3-0.7 units/ml Monitor platelets by anticoagulation protocol: Yes   Plan:  Begin heparin at 950 units/hr (no bolus) this evening at 1800 (4h before next enoxaparin dose due) Check heparin level 6 hours after drip started Daily heparin level, INR, CBC Watch INR - if goes above 2 will need to hold heparin drip F/u cardiology plans and ability to resume warfarin  Dimple Nanas, PharmD, BCPS 07/23/2022 2:00 PM

## 2022-07-23 NOTE — Progress Notes (Signed)
   PODIATRY PROGRESS NOTE  NAME Lauren [name] MRN TO:[id number] DOB 1981-03-27 DOA 07/19/2022   Reason for consult:  Chief Complaint  Patient presents with   Chest Pain     History of present illness: 42 y.o. female POD #2 s/p left 5th ray resection and bone biopsy of the 4th metatarsal, wound excision.  Currently states that she is feeling well.  No fevers or chills.  Currently no chest pain or shortness of breath.  Had chest pain which is resolved.  Cardiology consulted. Vitals:   07/23/22 1032 07/23/22 1200  BP: (!) 146/87 (!) 144/97  Pulse: 80 87  Resp:  18  Temp:  98.1 F (36.7 C)  SpO2:  100%   CBC    Component Value Date/Time   WBC 14.1 (H) 07/22/2022 0232   RBC 3.16 (L) 07/22/2022 0232   HGB 9.3 (L) 07/22/2022 0232   HGB 11.0 (L) 04/20/2022 1022   HCT 27.8 (L) 07/22/2022 0232   HCT 36.0 04/20/2022 1022   PLT 588 (H) 07/22/2022 0232   PLT 415 04/20/2022 1022   MCV 88.0 07/22/2022 0232   MCV 90 04/20/2022 1022   MCH 29.4 07/22/2022 0232   MCHC 33.5 07/22/2022 0232   RDW 13.8 07/22/2022 0232   RDW 14.5 04/20/2022 1022   LYMPHSABS 2.3 05/08/2022 2150   LYMPHSABS 1.6 04/20/2022 1022   MONOABS 0.6 05/08/2022 2150   EOSABS 0.2 05/08/2022 2150   EOSABS 0.2 04/20/2022 1022   BASOSABS 0.0 05/08/2022 2150   BASOSABS 0.0 04/20/2022 1022        Physical Exam: General: AAOx3, NAD  Dermatology: The lateral aspect of close coapted with sutures intact.  Some macerated tissue present on the central aspect.  There is no frank purulence.  Some bloody drainage on the bandage.  Also erythema without any ascending cellulitis.  No fluctuance or crepitation there is no malodor.    Vascular: Foot is warm and perfused  Neurological: Sensation decreased  Musculoskeletal Exam: No pain on exam    ASSESSMENT/PLAN OF CARE  POD # 1/p left foot fifth ray resection, bone biopsy fourth metatarsal with wound excision  -Dressing changed today.  Betadine applied to the incision  followed by adry sterile dressing. -Nonweightbearing left lower extremity -Culture 07/22/2022- NGTD -Pathology 07/22/2022- 4th metatarsal benign bone -Pathology 3/21 5th metatarsal with osteomyelitis  -Appreciate ID input- continue antibiotics -From podiatry standpoint to be discharged pending cardiology work up with oral antibiotics with close follow-up next week in the office.  Continue nonweightbearing.  Will plan on dressing change tomorrow.    Please contact me directly with any questions or concerns.     Celesta Gentile, DPM [facility]  Dr. Bonna Gains. [address], [address] [place], [place] [address]                Office [phone]  Fax [phone]

## 2022-07-24 ENCOUNTER — Inpatient Hospital Stay (HOSPITAL_COMMUNITY): Payer: BC Managed Care – PPO

## 2022-07-24 DIAGNOSIS — L089 Local infection of the skin and subcutaneous tissue, unspecified: Secondary | ICD-10-CM | POA: Diagnosis not present

## 2022-07-24 DIAGNOSIS — R079 Chest pain, unspecified: Secondary | ICD-10-CM | POA: Diagnosis not present

## 2022-07-24 DIAGNOSIS — E11628 Type 2 diabetes mellitus with other skin complications: Secondary | ICD-10-CM | POA: Diagnosis not present

## 2022-07-24 LAB — GLUCOSE, CAPILLARY
Glucose-Capillary: 90 mg/dL (ref 70–99)
Glucose-Capillary: 133 mg/dL — ABNORMAL HIGH (ref 70–99)
Glucose-Capillary: 135 mg/dL — ABNORMAL HIGH (ref 70–99)
Glucose-Capillary: 117 mg/dL — ABNORMAL HIGH (ref 70–99)
Glucose-Capillary: 143 mg/dL — ABNORMAL HIGH (ref 70–99)

## 2022-07-24 LAB — BASIC METABOLIC PANEL
Anion gap: 11 (ref 5–15)
BUN: 35 mg/dL — ABNORMAL HIGH (ref 6–20)
CO2: 21 mmol/L — ABNORMAL LOW (ref 22–32)
Calcium: 8.3 mg/dL — ABNORMAL LOW (ref 8.9–10.3)
Chloride: 104 mmol/L (ref 98–111)
Creatinine, Ser: 2.41 mg/dL — ABNORMAL HIGH (ref 0.44–1.00)
GFR, Estimated: 25 mL/min — ABNORMAL LOW (ref >60)
Glucose, Bld: 108 mg/dL — ABNORMAL HIGH (ref 70–99)
Potassium: 3.9 mmol/L (ref 3.5–5.1)
Sodium: 136 mmol/L (ref 135–145)

## 2022-07-24 LAB — CBC
HCT: 28.1 % — ABNORMAL LOW (ref 36.0–46.0)
Hemoglobin: 9 g/dL — ABNORMAL LOW (ref 12.0–15.0)
MCH: 28.3 pg (ref 26.0–34.0)
MCHC: 32 g/dL (ref 30.0–36.0)
MCV: 88.4 fL (ref 80.0–100.0)
Platelets: 614 10*3/uL — ABNORMAL HIGH (ref 150–400)
RBC: 3.18 MIL/uL — ABNORMAL LOW (ref 3.87–5.11)
RDW: 14.1 % (ref 11.5–15.5)
WBC: 10.2 10*3/uL (ref 4.0–10.5)
nRBC: 0 % (ref 0.0–0.2)

## 2022-07-24 LAB — PROTIME-INR
INR: 1.8 — ABNORMAL HIGH (ref 0.8–1.2)
Prothrombin Time: 20.6 seconds — ABNORMAL HIGH (ref 11.4–15.2)

## 2022-07-24 LAB — HEPARIN LEVEL (UNFRACTIONATED)
Heparin Unfractionated: 0.32 IU/mL (ref 0.30–0.70)
Heparin Unfractionated: 0.31 IU/mL (ref 0.30–0.70)

## 2022-07-24 LAB — ECHOCARDIOGRAM COMPLETE
AR max vel: 2.49 cm2
AV Area VTI: 2.27 cm2
AV Area mean vel: 2.43 cm2
AV Mean grad: 2 mmHg
AV Peak grad: 2.9 mmHg
Ao pk vel: 0.85 m/s
Area-P 1/2: 6.22 cm2
Height: 65 in
S' Lateral: 4.2 cm
Single Plane A2C EF: 34.2 %
Weight: 3120 oz

## 2022-07-24 LAB — CULTURE, BLOOD (ROUTINE X 2)
Culture: NO GROWTH
Culture: NO GROWTH
Special Requests: ADEQUATE
Special Requests: ADEQUATE

## 2022-07-24 LAB — PROCALCITONIN: Procalcitonin: 0.57 ng/mL

## 2022-07-24 LAB — BRAIN NATRIURETIC PEPTIDE: B Natriuretic Peptide: 2038.3 pg/mL — ABNORMAL HIGH (ref 0.0–100.0)

## 2022-07-24 LAB — C-REACTIVE PROTEIN: CRP: 1.9 mg/dL — ABNORMAL HIGH (ref <1.0)

## 2022-07-24 LAB — MAGNESIUM: Magnesium: 2.4 mg/dL (ref 1.7–2.4)

## 2022-07-24 MED ORDER — SCOPOLAMINE 1 MG/3DAYS TD PT72
1.0000 | MEDICATED_PATCH | TRANSDERMAL | Status: DC
  Administered 2022-07-24: 1.5 mg via TRANSDERMAL
  Filled 2022-07-24 (×2): qty 1

## 2022-07-24 MED ORDER — PERFLUTREN LIPID MICROSPHERE
1.0000 mL | INTRAVENOUS | Status: AC | PRN
  Administered 2022-07-24: 2 mL via INTRAVENOUS

## 2022-07-24 MED ORDER — HYDRALAZINE HCL 25 MG PO TABS
25.0000 mg | ORAL_TABLET | Freq: Three times a day (TID) | ORAL | Status: DC
  Administered 2022-07-24 – 2022-07-27 (×9): 25 mg via ORAL
  Filled 2022-07-24 (×12): qty 1

## 2022-07-24 NOTE — Progress Notes (Addendum)
   Rounding Note    Patient Name: Lauren [name] Date of Encounter: 07/24/2022  [place] Cardiologist: Carlyle Dolly, MD   Subjective   No further chest pain. Family at bedside.   Inpatient Medications    Scheduled Meds:  amoxicillin-clavulanate  1 tablet Oral BID   ascorbic acid  500 mg Oral Daily   atorvastatin  40 mg Oral Daily   Chlorhexidine Gluconate Cloth  6 each Topical Q0600   clopidogrel  75 mg Oral Daily   docusate sodium  100 mg Oral BID   doxycycline  100 mg Oral Q12H   hydrALAZINE  25 mg Oral Q8H   insulin pump   Subcutaneous TID WC, HS, 0200   isosorbide mononitrate  20 mg Oral BID   levothyroxine  125 mcg Oral Q0600   magnesium oxide  400 mg Oral Daily   metoprolol succinate  50 mg Oral Daily   multivitamin with minerals  1 tablet Oral Daily   mupirocin ointment  1 Application Nasal BID   pantoprazole  40 mg Oral Daily   ranolazine  500 mg Oral BID   tiZANidine  4 mg Oral QHS   traZODone  50 mg Oral QHS   zinc sulfate  220 mg Oral Daily   Continuous Infusions:  heparin 950 Units/hr (07/24/22 0623)   PRN Meds: acetaminophen **OR** acetaminophen, alum & mag hydroxide-simeth, bisacodyl, hydrALAZINE, ondansetron **OR** ondansetron (ZOFRAN) IV, oxyCODONE, polyethylene glycol, prochlorperazine   Vital Signs    Vitals:   07/23/22 1200 07/23/22 2120 07/24/22 0513 07/24/22 0803  BP: (!) 144/97 (!) 155/90 (!) 148/79 (!) 140/97  Pulse: 87 86 79 83  Resp: 18 16 18 17   Temp: 98.1 F (36.7 C) (!) 97.5 F (36.4 C) 98.3 F (36.8 C) 98 F (36.7 C)  TempSrc: Oral Oral Oral Oral  SpO2: 100% 100% 100% 100%  Weight:      Height:        Intake/Output Summary (Last 24 hours) at 07/24/2022 0841 Last data filed at 07/24/2022 [id number] Gross per 24 hour  Intake 1158.71 ml  Output --  Net 1158.71 ml      07/19/2022    5:30 AM 06/22/2022    6:32 AM 06/17/2022    2:22 PM  Last 3 Weights  Weight (lbs) 195 lb 185 lb 215 lb 12.8 oz  Weight (kg)  88.451 kg 83.915 kg 97.886 kg     ECG    Personally Reviewed  Physical Exam   GEN: No acute distress.  In chair. Cardiac: RRR.  Psych: Normal affect    Assessment & Plan    #NSTEMI #CAD - hsTrop 1210 - echo pending -cont statin, plavix, imdur, toprol, ranexa - warfarin held in anticipation of LHC - cont IV heparin - LHC planned for Monday, keep NPO Sunday night.   #CKD4 Complicates coronary eval.  Pre cath hydration  #Osteomyelitis Management per IM, ID, podiatry  For questions or updates, please contact [place] Please consult [email or web address] for contact info under        Signed, Vickie Epley, MD  07/24/2022, 8:41 AM

## 2022-07-24 NOTE — Progress Notes (Addendum)
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 CO2 used during procedure prior to contrast being used, this may be cause as well. Recommended premedicating prior to contrast being used in the future.    Wellbutrin [Bupropion] Hives   Cefepime Rash    Tolerates penicilllin   Ciprofloxacin Hcl Hives and Rash    Hives/rash at injection site; 01/15/22 tolerated IV cipro   Tape Rash    Patient Measurements: Height: 5\' 5"  (165.1 cm) Weight: 88.5 kg (195 lb) IBW/kg (Calculated) : 57 Heparin dosing weight: 76.4 kg  Vital Signs: Temp: 98.3 F (36.8 C) (03/23 0513) Temp Source: Oral (03/23 0513) BP: 148/79 (03/23 0513) Pulse Rate: 79 (03/23 0513)  Labs: Recent Labs    07/22/22 0232 07/22/22 1736 07/22/22 1842 07/22/22 2110 07/23/22 0311 07/24/22 0640  HGB 9.3*  --   --   --   --  9.0*  HCT 27.8*  --   --   --   --  28.1*  PLT 588*  --   --   --   --  614*  LABPROT 15.2  --   --   --  20.3* 20.6*  INR 1.2  --   --   --  1.8* 1.8*  HEPARINUNFRC  --   --   --   --   --  0.32  CREATININE 2.74*  --   --   --  2.65*  --   TROPONINIHS  --  1,198* 1,199* 1,210*  --   --      Estimated Creatinine Clearance: 31 mL/min (A) (by C-G formula based on SCr of 2.65 mg/dL (H)).   Assessment: 42 yo F presents with L foot osteomyelitis on 07/19/22. Pt has a history of stroke w/ aterial thrombus while on apixaban therapy and was transitioned to warfarin for this reason per hematology and neurology. Warfarin was reversed w/ IV vitamin K 5mg  on 3/19 for INR 3.4. S/p metatarsal excision and debridement w/ bone biopsy on 3/20 and pharmacy was consulted to dose warfarin w/ enoxaparin bridge that evening. Last dose of  warfarin given on 3/21. Last dose of enoxaparin 3/22 @ 0900. Pharmacy consulted to transition warfarin/enoxaparin to IV heparin for new ACS/NSTEMI pending work-up.   PTA warfarin regimen: warfarin 5mg  daily except for 7.5mg  on Wed and Sat  Heparin level is therapeutic on low end of goal range at 0.32 on 950 units/hr. INR is < 2 and it is appropriate to continue IV heparin. No bleeding noted, Hgb stable, platelets are elevated.  Goal of Therapy:  Heparin level 0.3-0.7 units/ml Monitor platelets by anticoagulation protocol: Yes   Plan:  Continue heparin drip at 950 units/hr (no bolus) Check confirmatory heparin level in 6 hours  Daily heparin level, INR, CBC Watch INR - if goes above 2 will need to hold heparin drip F/u after cath on Mon and ability to resume warfarin  Thank you for involving pharmacy in this patient's care.  Renold Genta, PharmD, BCPS Clinical Pharmacist Clinical phone for 07/24/2022 is [phone] 07/24/2022 8:00 AM   Addendum: Confirmatory heparin level is therapeutic at 0.31 on the low end of goal.   Increase heparin drip to 1050 units/hr to keep heparin level >0.3 Heparin level with am labs  [facility], PharmD,  BCPS 2:08 PM

## 2022-07-24 NOTE — Progress Notes (Addendum)
PROGRESS NOTE        PATIENT DETAILS Name: Lauren [name] Age: 42 y.o. Sex: female Date of Birth: Jun 16, 1980 Admit Date: 07/19/2022 Admitting Physician Karmen Bongo, MD [id number], [name], MD  Brief Summary: Patient is a 42 y.o.  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   Significant events: 3/18>> admit to [facility] 3/20>> left fifth metatarsal excision by podiatry 3/21>> back on insulin pump, chest pain with elevated troponins.  Significant studies: 3/18>> MRI left foot: Acute osteomyelitis of residual fifth metatarsal 3/18>> CXR: No PNA  Significant microbiology data: 3/18>> blood culture: No growth 3/20>> left fourth metatarsal: No growth  Procedures: 3/20>> left fifth metatarsal excision, bone biopsy of fourth metatarsal.  Consults: Podiatry Vascular surgery ID Cards  Subjective: Lying comfortable in bed-no chest pain overnight.  No nausea or vomiting.  Objective: Vitals: Blood pressure (!) 140/97, pulse 83, temperature 98 F (36.7 C), temperature source Oral, resp. rate 17, height 5\' 5"  (1.651 m), weight 88.5 kg, last menstrual period 07/18/2022, SpO2 100 %.      07/24/2022    8:03 AM 07/24/2022    5:13 AM 07/23/2022    9:20 PM  Vitals with BMI  Systolic [id number]  Diastolic 97 79 90  Pulse 83 79 86    Exam:  Awake Alert, No new F.N deficits, chronic left-sided weakness and facial droop from previous stroke several months ago, .AT,PERRAL Supple Neck, No JVD,   Symmetrical Chest wall movement, Good air movement bilaterally, CTAB RRR,No Gallops, Rubs or new Murmurs,  +ve B.Sounds, Abd Soft, No tenderness,   No Cyanosis, Clubbing or  edema    Assessment/Plan:  Left diabetic foot with Acute osteomyelitis of fifth metatarsal-s/p excision of left fifth metatarsal  and bone biopsy of fourth metatarsal on 3/20 Left fourth metatarsal culture negative so far Initially on IV antibiotics-evaluated by ID and switched to Doxy/Augmentin  Podiatry following-nonweightbearing to left lower extremity.   Chest pain-elevated troponins-non-STEMI versus demand ischemia Known CAD-last LHC 2015-cardiology following and contemplating left heart cath on 07/26/2022, Check echo Already on Plavix/statin/beta-blocker/Ranexa and has been on heparin drip per cardiology Await cardiology input No further chest pain since 3/21 evening.  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 Suspicion that both of these events were embolic-hence maintained on Coumadin prior to this hospitalization Coumadin reversed with vitamin K-postoperatively-restarted on Coumadin /Lovenox bridge.  INR slowly creeping back up. Note-extensive prior workup including hypercoagulable workup negative Reviewed outpatient neurology note-no documentation of A-fib-Per neurology-no anticoagulation from their point of view, however given to embolic events-hematology has maintained patient on anticoagulation.  This MD spoke with primary hematologist Dr Chryl Heck on 3/19-she has  arranged for a second opinion  at [facility] hematology.  Plan is to continue full anticoagulation until seen by hematology for a second opinion.  CKD stage IV Close to baseline Follow electrolytes Avoid nephrotoxic agents  Normocytic anemia Baseline anemia due to CKD-likely worsened due to acute illness No evidence of blood loss Follow-and transfuse if Hb <7    HLD Statin  Hypothyroidism Synthroid  Hypertension.  Blood pressure slightly more elevated than desired, have increased hydralazine dose, continue beta-blocker, Imdur, as before.  Monitor and adjust.    DM-2 (A1C 7.0 on 3/18) with uncontrolled hyperglycemia Insulin pump being continued, continue to watch CBGs closely.  Overall better controlled-per patient-she does not do well with basal/bolus regimen.  Recent Labs    07/23/22 2129 07/24/22 0525 07/24/22 0800  GLUCAP 96 90 133*   Obesity: Estimated body mass index is 32.45 kg/m as calculated from the following:   Height as of this encounter: 5\' 5"  (1.651 m).   Weight as of this encounter: 88.5 kg.   Code status:   Code Status: Full Code (3/20>> wanted to revoke DNR-full code order entered)  DVT Prophylaxis:    Family Communication: None at bedside   Disposition Plan: Status is: Inpatient Remains inpatient appropriate because: Severity of illness   Planned Discharge Destination:Home health over the next several days.   Diet: Diet Order             Diet heart healthy/carb modified Room service appropriate? Yes; Fluid consistency: Thin  Diet effective now                   MEDICATIONS: Scheduled Meds:  amoxicillin-clavulanate  1 tablet Oral BID   ascorbic acid  500 mg Oral Daily   atorvastatin  40 mg Oral Daily   Chlorhexidine Gluconate Cloth  6 each Topical Q0600   clopidogrel  75 mg Oral Daily   docusate sodium  100 mg Oral BID   doxycycline  100 mg Oral Q12H   hydrALAZINE  12.5 mg Oral BID   insulin pump   Subcutaneous TID WC, HS, 0200    isosorbide mononitrate  20 mg Oral BID   levothyroxine  125 mcg Oral Q0600   magnesium oxide  400 mg Oral Daily   metoprolol succinate  50 mg Oral Daily   multivitamin with minerals  1 tablet Oral Daily   mupirocin ointment  1 Application Nasal BID   pantoprazole  40 mg Oral Daily   ranolazine  500 mg Oral BID   tiZANidine  4 mg Oral QHS   traZODone  50 mg Oral QHS   zinc sulfate  220 mg Oral Daily   Continuous Infusions:  heparin 950 Units/hr (07/24/22 0623)   PRN Meds:.acetaminophen **OR** acetaminophen, alum & mag hydroxide-simeth, bisacodyl, hydrALAZINE, ondansetron **OR** ondansetron (ZOFRAN) IV, oxyCODONE, polyethylene glycol, prochlorperazine   I have personally reviewed following labs and imaging studies  LABORATORY DATA:  Recent Labs  Lab 07/19/22 0533 07/20/22 0127 07/21/22 0201 07/22/22 0232 07/24/22 0640  WBC 13.6* 11.7* 11.2* 14.1* 10.2  HGB 9.1* 8.1* 7.7* 9.3* 9.0*  HCT 28.0* 25.8* 24.0* 27.8* 28.1*  PLT 506* 446* 443* 588* 614*  MCV 89.5 89.0 89.2 88.0 88.4  MCH 29.1 27.9 28.6 29.4 28.3  MCHC 32.5 31.4 32.1 33.5 32.0  RDW 13.8 14.0 13.8 13.8 14.1    Recent Labs  Lab 07/19/22 0918 07/19/22 1451 07/19/22 1459 07/20/22 0127 07/21/22 0201 07/22/22 0232 07/23/22 0311 07/24/22 0640  NA  --   --   --  134*  136 131* 134* 136  K  --   --   --  4.4 4.1 5.0 4.0 3.9  CL  --   --   --  102 107 104 105 104  CO2  --   --   --  18* 21* 16* 21* 21*  ANIONGAP  --   --   --  14 8 11 8 11   GLUCOSE  --   --   --  173* 107* 463* 97 108*  BUN  --   --   --  41* 45* 46* 42* 35*  CREATININE  --   --   --  2.80* 2.73* 2.74* 2.65* 2.41*  CRP  --  6.1*  --   --   --   --   --  1.9*  PROCALCITON  --   --   --   --   --   --   --  0.57  LATICACIDVEN 0.7  --   --   --   --   --   --   --   INR  --   --   --  3.4* 1.5* 1.2 1.8* 1.8*  HGBA1C  --   --  7.0*  --   --   --   --   --   BNP  --   --   --   --   --   --   --  2,038.3*  CALCIUM  --   --   --  8.1* 8.1* 8.0* 8.3*  8.3*   RADIOLOGY STUDIES/RESULTS: No results found.   LOS: 5 days   Signature  -    Lala Lund M.D on 07/24/2022 at 8:28 AM   -  To page go to [email or web address]

## 2022-07-24 NOTE — Progress Notes (Signed)
   PODIATRY PROGRESS NOTE  NAME Lauren [name] MRN [id number] DOB [birth year] DOA 07/19/2022   Reason for consult:  Chief Complaint  Patient presents with   Chest Pain     History of present illness: 42 y.o. female POD #3 s/p left 5th ray resection and bone biopsy of the 4th metatarsal, wound excision. Plan for [facility] Monday.   Vitals:   07/24/22 0513 07/24/22 0803  BP: (!) 148/79 (!) 140/97  Pulse: 79 83  Resp: 18 17  Temp: 98.3 F (36.8 C) 98 F (36.7 C)  SpO2: 100% 100%    CBC    Component Value Date/Time   WBC 10.2 07/24/2022 0640   RBC 3.18 (L) 07/24/2022 0640   HGB 9.0 (L) 07/24/2022 0640   HGB 11.0 (L) 04/20/2022 1022   HCT 28.1 (L) 07/24/2022 0640   HCT 36.0 04/20/2022 1022   PLT 614 (H) 07/24/2022 0640   PLT 415 04/20/2022 1022   MCV 88.4 07/24/2022 0640   MCV 90 04/20/2022 1022   MCH 28.3 07/24/2022 0640   MCHC 32.0 07/24/2022 0640   RDW 14.1 07/24/2022 0640   RDW 14.5 04/20/2022 1022   LYMPHSABS 2.3 05/08/2022 2150   LYMPHSABS 1.6 04/20/2022 1022   MONOABS 0.6 05/08/2022 2150   EOSABS 0.2 05/08/2022 2150   EOSABS 0.2 04/20/2022 1022   BASOSABS 0.0 05/08/2022 2150   BASOSABS 0.0 04/20/2022 1022      Physical Exam: General: AAOx3, NAD  Dermatology: Dressing change.  Incision coapted.  Minimal macerated tissue on the central aspect.  There is some mild bloody drainage on the bandage but improved.  There is no purulence noted.  There is still some mild edema but there is no significant ascending cellulitis.  No fluctuance or crepitation there is no malodor.   Vascular: Foot is warm and perfused  Neurological: Sensation decreased  Musculoskeletal Exam: No pain on exam    ASSESSMENT/PLAN OF CARE  POD # 3 /p left foot fifth ray resection, bone biopsy fourth metatarsal with wound excision  -Dressing changed today.  Betadine applied to the incision followed by adry sterile dressing. -Nonweightbearing left lower extremity -Culture 07/22/2022-  NGTD -Pathology 07/22/2022- 4th metatarsal benign bone -Pathology 3/21 5th metatarsal with osteomyelitis  -Appreciate ID input- continue antibiotics -Podiatry will continue to follow while inpatient.  May be discharged from our standpoint once medically cleared.     Please contact me directly with any questions or concerns.     Celesta Gentile, DPM [facility]  Dr. Bonna Gains. [address], [address] [place], [place] [address]                Office [phone]  Fax [phone]

## 2022-07-24 NOTE — Evaluation (Signed)
Physical Therapy Evaluation and DIscharge Patient Details Name: Lauren [name] MRN: [id number] DOB: 11-10-1980 Today's Date: 07/24/2022  History of Present Illness  Pt is a 42 y/o female admitted with osteomyelitis of L foot. Underwent L 5th ray resection and bone biopsy of 4th metatarsal, wound excision on 3/20. Potential L heart cath on 3/25. PMH: CVA requiring thrombectomy, CAD, CKD, CHF, DM  Clinical Impression  Pt admitted with above diagnosis. Pt from home with family, was needing occasional min HHA with ambulation and some assist with ADL's which she usually gets from her daughter. Due to pt's LUE weakness from CVA, she cannot safely WB through that side to ambulate NWB which means she will need to mobilize short distances WBAT and use w/c for longer distances. This was her state before coming into hospital due to L foot as well. Pt ambulating with unilateral support at supervision level. From a mobility standpoint has not had a significant change from her baseline. Will have mobility team check on her but she is mobilizing to and from bathroom safely with IV pole at this time and reviewed LLE HEP with her.  Pt currently with functional limitations due to the deficits listed below (see PT Problem List). Pt will benefit from acute skilled PT to increase their independence and safety with mobility to allow discharge.          Recommendations for follow up therapy are one component of a multi-disciplinary discharge planning process, led by the attending physician.  Recommendations may be updated based on patient status, additional functional criteria and insurance authorization.  Follow Up Recommendations No PT follow up      Assistance Recommended at Discharge Intermittent Supervision/Assistance  Patient can return home with the following  Assist for transportation;Assistance with cooking/housework;A little help with walking and/or transfers;A little help with bathing/dressing/bathroom     Equipment Recommendations None recommended by PT  Recommendations for Other Services       Functional Status Assessment Patient has not had a recent decline in their functional status     Precautions / Restrictions Precautions Precautions: Fall;Other (comment) Precaution Comments: L darco shoe Restrictions Weight Bearing Restrictions: Yes Other Position/Activity Restrictions: "OK to WB on heel to bathroom or short distances. Otherwise NWB"      Mobility  Bed Mobility Overal bed mobility: Modified Independent                  Transfers Overall transfer level: Needs assistance Equipment used: None Transfers: Sit to/from Stand Sit to Stand: Supervision           General transfer comment: supervision from chair and toilet    Ambulation/Gait Ambulation/Gait assistance: Supervision Gait Distance (Feet): 10 Feet (2x) Assistive device: IV Pole Gait Pattern/deviations: Step-through pattern (hemiparetic) Gait velocity: decreased Gait velocity interpretation: <1.8 ft/sec, indicate of risk for recurrent falls   General Gait Details: at home pt has RW with L hand strap to hold hand on but can't tolerate significant wt through that wrist to allow for NWB gait pattern. Will need to limit distance to allowable length with WBAT and use w/c for longer distances  Stairs            Wheelchair Mobility    Modified Rankin (Stroke Patients Only)       Balance Overall balance assessment: Needs assistance Sitting-balance support: No upper extremity supported, Feet supported Sitting balance-Leahy Scale: Good     Standing balance support: Single extremity supported, During functional activity Standing balance-Leahy Scale: Fair  Pertinent Vitals/Pain Pain Assessment Pain Assessment: Faces Faces Pain Scale: Hurts little more Pain Location: HA and nausea persistent ever since CVA Pain Descriptors / Indicators:  Headache Pain Intervention(s): Patient requesting pain meds-RN notified    Home Living Family/patient expects to be discharged to:: Private residence Living Arrangements: Spouse/significant other;Children Available Help at Discharge: Family;Available 24 hours/day Type of Home: Mobile home Home Access: Stairs to enter;Ramped entrance Entrance Stairs-Rails: Right;Left Entrance Stairs-Number of Steps: 4 or 6   Home Layout: One level Home Equipment: BSC/3in1;Tub bench;Rolling Walker (2 wheels);Wheelchair - manual;Grab bars - tub/shower;Shower seat Additional Comments: 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 Pt reports some assist with ADLs/IADLs from family     Hand Dominance   Dominant Hand: Right    Extremity/Trunk Assessment   Upper Extremity Assessment Upper Extremity Assessment: Defer to OT evaluation LUE Deficits / Details: increased tone in L hand, unable to fully stretch digits w/o pain. reports wearing resting hand splint at night at home. Wrist tight, elbow passive ROM WFL. shoulder to 90* passively w/ some movement LUE Coordination: decreased fine motor;decreased gross motor    Lower Extremity Assessment Lower Extremity Assessment: LLE deficits/detail LLE Deficits / Details: LLE >3/5 LLE Sensation: decreased proprioception LLE Coordination: decreased gross motor    Cervical / Trunk Assessment Cervical / Trunk Assessment: Normal  Communication   Communication: No difficulties  Cognition Arousal/Alertness: Awake/alert Behavior During Therapy: WFL for tasks assessed/performed Overall Cognitive Status: Within Functional Limits for tasks assessed                                           General Comments General comments (skin integrity, edema, etc.): VSS. Husband present and we talked through how they mobilize safely at home. Also discussed some alternative med strategies that they could try for persisten HA and nausea.    Exercises General Exercises - Lower Extremity Ankle Circles/Pumps: AROM, Both, 10 reps, Seated Other Exercises Other Exercises: reviewed LE HEP that she has been doing and instructed to continue upon d/c   Assessment/Plan    PT Assessment Patient does not need any further PT services  PT Problem List         PT Treatment Interventions      PT Goals (Current goals can be found in the Care Plan section)  Acute Rehab PT Goals Patient Stated Goal: return home    Frequency       Co-evaluation               AM-PAC PT "6 Clicks" Mobility  Outcome Measure Help needed turning from your back to your side while in a flat bed without using bedrails?: None Help needed moving from lying on your back to sitting on the side of a flat bed without using bedrails?: None Help needed moving to and from a bed to a chair (including a wheelchair)?: None Help needed standing up from a chair using your arms (e.g., wheelchair or bedside chair)?: A Little Help needed to walk in hospital room?: A Little Help needed climbing 3-5  steps with a railing? : A Lot 6 Click Score: 20    End of Session   Activity Tolerance: Patient tolerated treatment well Patient left: in chair;with call bell/phone within reach;with family/visitor present Nurse Communication: Mobility status PT Visit Diagnosis: Pain;Difficulty in walking, not elsewhere classified (R26.2);Hemiplegia and hemiparesis Hemiplegia - Right/Left: Left Hemiplegia - dominant/non-dominant: Non-dominant Hemiplegia - caused by: Cerebral infarction Pain - Right/Left:  (head) Pain - part of body:  (head)    Time: [id number] PT Time Calculation (min) (ACUTE ONLY): 26 min   Charges:   PT Evaluation $PT  Eval Low Complexity: 1 Low PT Treatments $Gait Training: 8-22 mins        [place] chat preferred Office [place] 07/24/2022, 11:27 AM

## 2022-07-24 NOTE — Evaluation (Signed)
Occupational Therapy Evaluation Patient Details Name: Lauren [name] MRN: [id number] DOB: 1980/08/13 Today's Date: 07/24/2022   History of Present Illness Pt is a 42 y/o female admitted with osteomyelitis of L foot. Underwent L 5th ray resection and bone biopsy of 4th metatarsal, wound excision on 3/20. Potential L heart cath on 3/25. PMH: CVA requiring thrombectomy, CAD, CKD, CHF, DM   Clinical Impression   PTA, pt lives with family who can provide 24/7 support. Pt reports typically ambulatory without AD and has intermittent light assist for ADLs/IADLs since CVA in Oct 2023. Pt presents now fairly close to reported baseline, able to transfer with no more than min guard. Pt requires overall Min-Mod A for ADLs, familiar with hemi techniques since AIR stay. Pt reports wearing a resting hand splint at home and noted hypertonicity of L digits/wrist - encouraged ROM exercises and to bring splint from home if pt to remain admitted. Discussed ADL mgmt, DME needs and WB precaution clarification w/ pt/family confirming comfortable in providing assist at DC. Pt hopeful to resume OP therapy services upon DC.   **Based on podiatry notes, educated pt on NWB LLE but later clarified with podiatry pt allowance for WB through heel for short distances after evaluation.**     Recommendations for follow up therapy are one component of a multi-disciplinary discharge planning process, led by the attending physician.  Recommendations may be updated based on patient status, additional functional criteria and insurance authorization.   Follow Up Recommendations  Outpatient OT (pt/family hope to resume OP therapy services)     Assistance Recommended at Discharge Intermittent Supervision/Assistance  Patient can return home with the following A little help with bathing/dressing/bathroom;Assistance with cooking/housework;Assist for transportation    Functional Status Assessment  Patient has had a recent decline in  their functional status and demonstrates the ability to make significant improvements in function in a reasonable and predictable amount of time.  Equipment Recommendations  None recommended by OT    Recommendations for Other Services       Precautions / Restrictions Precautions Precautions: Fall;Other (comment) Precaution Comments: L darco shoe Restrictions Weight Bearing Restrictions: Yes Other Position/Activity Restrictions: "OK to WB on heel to bathroom or short distances. Otherwise NWB"      Mobility Bed Mobility Overal bed mobility: Modified Independent                  Transfers Overall transfer level: Needs assistance Equipment used: None Transfers: Sit to/from Stand, Bed to chair/wheelchair/BSC Sit to Stand: Min guard Stand pivot transfers: Min guard         General transfer comment: cued for NWB LLE based on podiatry note until can be further clarified w/ MD (later confirmed some WB allowed)      Balance Overall balance assessment: Needs assistance Sitting-balance support: No upper extremity supported, Feet supported Sitting balance-Leahy Scale: Good     Standing balance support: Single extremity supported, During functional activity Standing balance-Leahy Scale: Fair                             ADL either performed or assessed with clinical judgement   ADL Overall ADL's : Needs assistance/impaired Eating/Feeding: Set up   Grooming: Set up;Sitting   Upper Body Bathing: Minimal assistance;Sitting   Lower Body Bathing: Minimal assistance;Sit to/from stand   Upper Body Dressing : Minimal assistance;Sitting   Lower Body Dressing: Moderate assistance Lower Body Dressing Details (indicate cue type and reason): assist  with L darco shoe Toilet Transfer: Min guard;Stand-pivot   Toileting- Clothing Manipulation and Hygiene: Minimal assistance;Sitting/lateral lean;Sit to/from stand         General ADL Comments: Pt appears fairly  close to most recent ADL baseline since AIR stay and prior stroke. Discussed hemi strategies, DME use and family assist at home w/ pt/family verbalizing comfort w/ assist levels.     Vision Baseline Vision/History: 1 Wears glasses Ability to See in Adequate Light: 1 Impaired Patient Visual Report: No change from baseline Vision Assessment?: No apparent visual deficits     Perception     Praxis      Pertinent Vitals/Pain Pain Assessment Pain Assessment: No/denies pain     Hand Dominance Right   Extremity/Trunk Assessment Upper Extremity Assessment Upper Extremity Assessment: LUE deficits/detail LUE Deficits / Details: increased tone in L hand, unable to fully stretch digits w/o pain. reports wearing resting hand splint at night at home. Wrist tight, elbow passive ROM WFL. shoulder to 90* passively w/ some movement LUE Coordination: decreased fine motor;decreased gross motor   Lower Extremity Assessment Lower Extremity Assessment: Defer to PT evaluation   Cervical / Trunk Assessment Cervical / Trunk Assessment: Normal   Communication Communication Communication: No difficulties   Cognition Arousal/Alertness: Awake/alert Behavior During Therapy: WFL for tasks assessed/performed Overall Cognitive Status: Within Functional Limits for tasks assessed                                       General Comments  Multiple family members present. Pt reports MD told her she was allowed to WB through heel and put some weight through foot but to "try to keep off it"; educated on NWB LLE noted in chart. Later clarified with podiatry and updated WB recs in note according to secure chat    Exercises     Shoulder Instructions      Home Living Family/patient expects to be discharged to:: Private residence Living Arrangements: Spouse/significant other;Children Available Help at Discharge: Family;Available 24 hours/day Type of Home: Mobile home Home Access: Stairs to  enter;Ramped entrance Entrance Stairs-Number of Steps: 4 or 6 Entrance Stairs-Rails: Right;Left Home Layout: One level     Bathroom Shower/Tub: Walk-in shower;Tub/shower unit   Bathroom Toilet: Standard (BSC over top) Bathroom Accessibility: Yes How Accessible: Accessible via walker Home Equipment: BSC/3in1;Tub bench;Rolling Walker (2 wheels);Wheelchair - manual;Grab bars - tub/shower;Shower seat   Additional Comments: 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 Pt reports some assist with ADLs/IADLs from family        OT Problem List: Decreased strength;Decreased activity tolerance;Impaired balance (sitting and/or standing);Decreased coordination;Decreased knowledge of precautions;Impaired UE functional use;Impaired tone      OT Treatment/Interventions: Self-care/ADL training;Therapeutic exercise;Energy conservation;DME and/or AE instruction;Therapeutic activities;Patient/family education    OT Goals(Current goals can be found in the care plan section) Acute Rehab OT Goals Patient Stated Goal: resume OP therapy, go home OT Goal Formulation: With patient/family Time For Goal Achievement: 08/07/22 Potential to Achieve Goals: Good  OT Frequency: Min 2X/week    Co-evaluation  AM-PAC OT "6 Clicks" Daily Activity     Outcome Measure Help from another person eating meals?: A Little Help from another person taking care of personal grooming?: A Little Help from another person toileting, which includes using toliet, bedpan, or urinal?: A Little Help from another person bathing (including washing, rinsing, drying)?: A Little Help from another person to put on and taking off regular upper body  clothing?: A Little Help from another person to put on and taking off regular lower body clothing?: A Lot 6 Click Score: 17   End of Session Equipment Utilized During Treatment: Gait belt Nurse Communication: Mobility status;Weight bearing status  Activity Tolerance: Patient tolerated treatment well Patient left: in chair;with call bell/phone within reach;with family/visitor present;Other (comment) (pt declined chair alarm)  OT Visit Diagnosis: Unsteadiness on feet (R26.81);Other abnormalities of gait and mobility (R26.89);Muscle weakness (generalized) (M62.81)                Time: [id number] OT Time Calculation (min): 28 min Charges:  OT General Charges $OT Visit: 1 Visit OT Evaluation $OT Eval Low Complexity: 1 Low OT Treatments $Self Care/Home Management : 8-22 mins  Malachy Chamber, OTR/L Acute Rehab Services Office: [phone]   Layla Maw 07/24/2022, 9:14 AM

## 2022-07-24 NOTE — H&P (View-Only) (Signed)
   Rounding Note    Patient Name: Lauren [name] Date of Encounter: 07/24/2022  [place] Cardiologist: Carlyle Dolly, MD   Subjective   No further chest pain. Family at bedside.   Inpatient Medications    Scheduled Meds:  amoxicillin-clavulanate  1 tablet Oral BID   ascorbic acid  500 mg Oral Daily   atorvastatin  40 mg Oral Daily   Chlorhexidine Gluconate Cloth  6 each Topical Q0600   clopidogrel  75 mg Oral Daily   docusate sodium  100 mg Oral BID   doxycycline  100 mg Oral Q12H   hydrALAZINE  25 mg Oral Q8H   insulin pump   Subcutaneous TID WC, HS, 0200   isosorbide mononitrate  20 mg Oral BID   levothyroxine  125 mcg Oral Q0600   magnesium oxide  400 mg Oral Daily   metoprolol succinate  50 mg Oral Daily   multivitamin with minerals  1 tablet Oral Daily   mupirocin ointment  1 Application Nasal BID   pantoprazole  40 mg Oral Daily   ranolazine  500 mg Oral BID   tiZANidine  4 mg Oral QHS   traZODone  50 mg Oral QHS   zinc sulfate  220 mg Oral Daily   Continuous Infusions:  heparin 950 Units/hr (07/24/22 0623)   PRN Meds: acetaminophen **OR** acetaminophen, alum & mag hydroxide-simeth, bisacodyl, hydrALAZINE, ondansetron **OR** ondansetron (ZOFRAN) IV, oxyCODONE, polyethylene glycol, prochlorperazine   Vital Signs    Vitals:   07/23/22 1200 07/23/22 2120 07/24/22 0513 07/24/22 0803  BP: (!) 144/97 (!) 155/90 (!) 148/79 (!) 140/97  Pulse: 87 86 79 83  Resp: 18 16 18 17   Temp: 98.1 F (36.7 C) (!) 97.5 F (36.4 C) 98.3 F (36.8 C) 98 F (36.7 C)  TempSrc: Oral Oral Oral Oral  SpO2: 100% 100% 100% 100%  Weight:      Height:        Intake/Output Summary (Last 24 hours) at 07/24/2022 0841 Last data filed at 07/24/2022 [id number] Gross per 24 hour  Intake 1158.71 ml  Output --  Net 1158.71 ml      07/19/2022    5:30 AM 06/22/2022    6:32 AM 06/17/2022    2:22 PM  Last 3 Weights  Weight (lbs) 195 lb 185 lb 215 lb 12.8 oz  Weight (kg)  88.451 kg 83.915 kg 97.886 kg     ECG    Personally Reviewed  Physical Exam   GEN: No acute distress.  In chair. Cardiac: RRR.  Psych: Normal affect    Assessment & Plan    #NSTEMI #CAD - hsTrop 1210 - echo pending -cont statin, plavix, imdur, toprol, ranexa - warfarin held in anticipation of LHC - cont IV heparin - LHC planned for Monday, keep NPO Sunday night.   #CKD4 Complicates coronary eval.  Pre cath hydration  #Osteomyelitis Management per IM, ID, podiatry  For questions or updates, please contact [place] Please consult [email or web address] for contact info under        Signed, Vickie Epley, MD  07/24/2022, 8:41 AM

## 2022-07-25 DIAGNOSIS — E11628 Type 2 diabetes mellitus with other skin complications: Secondary | ICD-10-CM | POA: Diagnosis not present

## 2022-07-25 DIAGNOSIS — L089 Local infection of the skin and subcutaneous tissue, unspecified: Secondary | ICD-10-CM | POA: Diagnosis not present

## 2022-07-25 LAB — BPAM RBC
Blood Product Expiration Date: 202403272359
Blood Product Expiration Date: 202404072359
Blood Product Expiration Date: 202404282359
Blood Product Expiration Date: 202404282359
Unit Type and Rh: 5100
Unit Type and Rh: 5100
Unit Type and Rh: 5100
Unit Type and Rh: 5100

## 2022-07-25 LAB — CBC
HCT: 27.5 % — ABNORMAL LOW (ref 36.0–46.0)
Hemoglobin: 8.8 g/dL — ABNORMAL LOW (ref 12.0–15.0)
MCH: 28.5 pg (ref 26.0–34.0)
MCHC: 32 g/dL (ref 30.0–36.0)
MCV: 89 fL (ref 80.0–100.0)
Platelets: 574 10*3/uL — ABNORMAL HIGH (ref 150–400)
RBC: 3.09 MIL/uL — ABNORMAL LOW (ref 3.87–5.11)
RDW: 14.2 % (ref 11.5–15.5)
WBC: 9.9 10*3/uL (ref 4.0–10.5)
nRBC: 0 % (ref 0.0–0.2)

## 2022-07-25 LAB — TYPE AND SCREEN
ABO/RH(D): A POS
Antibody Screen: POSITIVE
DAT, IgG: POSITIVE
Donor AG Type: NEGATIVE
Donor AG Type: NEGATIVE
Donor AG Type: NEGATIVE
Unit division: 0
Unit division: 0
Unit division: 0
Unit division: 0

## 2022-07-25 LAB — BASIC METABOLIC PANEL
Anion gap: 7 (ref 5–15)
BUN: 34 mg/dL — ABNORMAL HIGH (ref 6–20)
CO2: 23 mmol/L (ref 22–32)
Calcium: 8.1 mg/dL — ABNORMAL LOW (ref 8.9–10.3)
Chloride: 106 mmol/L (ref 98–111)
Creatinine, Ser: 2.3 mg/dL — ABNORMAL HIGH (ref 0.44–1.00)
GFR, Estimated: 27 mL/min — ABNORMAL LOW (ref >60)
Glucose, Bld: 78 mg/dL (ref 70–99)
Potassium: 4.7 mmol/L (ref 3.5–5.1)
Sodium: 136 mmol/L (ref 135–145)

## 2022-07-25 LAB — BRAIN NATRIURETIC PEPTIDE: B Natriuretic Peptide: 1223.4 pg/mL — ABNORMAL HIGH (ref 0.0–100.0)

## 2022-07-25 LAB — MAGNESIUM: Magnesium: 2.4 mg/dL (ref 1.7–2.4)

## 2022-07-25 LAB — C-REACTIVE PROTEIN: CRP: 0.9 mg/dL (ref <1.0)

## 2022-07-25 LAB — HEPARIN LEVEL (UNFRACTIONATED)
Heparin Unfractionated: 0.11 IU/mL — ABNORMAL LOW (ref 0.30–0.70)
Heparin Unfractionated: 0.19 IU/mL — ABNORMAL LOW (ref 0.30–0.70)
Heparin Unfractionated: 0.4 IU/mL (ref 0.30–0.70)

## 2022-07-25 LAB — GLUCOSE, CAPILLARY
Glucose-Capillary: 99 mg/dL (ref 70–99)
Glucose-Capillary: 115 mg/dL — ABNORMAL HIGH (ref 70–99)
Glucose-Capillary: 98 mg/dL (ref 70–99)
Glucose-Capillary: 140 mg/dL — ABNORMAL HIGH (ref 70–99)
Glucose-Capillary: 255 mg/dL — ABNORMAL HIGH (ref 70–99)

## 2022-07-25 LAB — PROTIME-INR
INR: 1.6 — ABNORMAL HIGH (ref 0.8–1.2)
Prothrombin Time: 18.6 seconds — ABNORMAL HIGH (ref 11.4–15.2)

## 2022-07-25 LAB — PROCALCITONIN: Procalcitonin: 0.55 ng/mL

## 2022-07-25 MED ORDER — PREDNISONE 20 MG PO TABS
50.0000 mg | ORAL_TABLET | Freq: Four times a day (QID) | ORAL | Status: AC
  Administered 2022-07-25 – 2022-07-26 (×3): 50 mg via ORAL
  Filled 2022-07-25 (×3): qty 1

## 2022-07-25 MED ORDER — DIPHENHYDRAMINE HCL 50 MG/ML IJ SOLN: 50.0000 mg | Freq: Once | INTRAMUSCULAR | Status: AC

## 2022-07-25 MED ORDER — LACTATED RINGERS IV SOLN: INTRAVENOUS | Status: AC

## 2022-07-25 MED ORDER — ASPIRIN 81 MG PO CHEW
81.0000 mg | CHEWABLE_TABLET | ORAL | Status: AC
  Administered 2022-07-26: 81 mg via ORAL
  Filled 2022-07-25: qty 1

## 2022-07-25 MED ORDER — DIPHENHYDRAMINE HCL 25 MG PO CAPS
50.0000 mg | ORAL_CAPSULE | Freq: Once | ORAL | Status: AC
  Administered 2022-07-26: 50 mg via ORAL
  Filled 2022-07-25: qty 2

## 2022-07-25 NOTE — Progress Notes (Signed)
[place] for  heparin Indication: NSTEMI and history of stroke and arterial thrombosis Brief A/P: Heparin level subtherapeutic Increase Heparin rate  Allergies  Allergen Reactions   Visipaque [Iodixanol] Nausea And Vomiting    Patient had vagal response during procedure became hypertensive, and bradycardic. CO2 used during procedure prior to contrast being used, this may be cause as well. Recommended premedicating prior to contrast being used in the future.    Wellbutrin [Bupropion] Hives   Cefepime Rash    Tolerates penicilllin   Ciprofloxacin Hcl Hives and Rash    Hives/rash at injection site; 01/15/22 tolerated IV cipro   Tape Rash    Patient Measurements: Height: 5\' 5"  (165.1 cm) Weight: 88.5 kg (195 lb) IBW/kg (Calculated) : 57 Heparin dosing weight: 76.4 kg  Vital Signs: Temp: 97.5 F (36.4 C) (03/24 0003) Temp Source: Oral (03/24 0003) BP: 145/79 (03/24 0003) Pulse Rate: 95 (03/24 0003)  Labs: Recent Labs    07/22/22 1736 07/22/22 1842 07/22/22 2110 07/23/22 0311 07/24/22 0640 07/24/22 1304 07/25/22 0205  HGB  --   --   --   --  9.0*  --  8.8*  HCT  --   --   --   --  28.1*  --  27.5*  PLT  --   --   --   --  614*  --  574*  LABPROT  --   --   --  20.3* 20.6*  --  18.6*  INR  --   --   --  1.8* 1.8*  --  1.6*  HEPARINUNFRC  --   --   --   --  0.32 0.31 0.11*  CREATININE  --   --   --  2.65* 2.41*  --   --   TROPONINIHS 1,198* 1,199* 1,210*  --   --   --   --      Estimated Creatinine Clearance: 34.1 mL/min (A) (by C-G formula based on SCr of 2.41 mg/dL (H)).   Assessment: 42 y.o. female with NSTEMI,  h/o CVA and arterial thrombosis, Coumadin on hold, for heparin  Goal of Therapy:  Heparin level 0.3-0.7 units/ml Monitor platelets by anticoagulation protocol: Yes   Plan:  Increase Heparin 1250 units/hr Check heparin level in 8 hours.   Phillis Knack, PharmD, BCPS

## 2022-07-25 NOTE — Plan of Care (Signed)

## 2022-07-25 NOTE — Progress Notes (Signed)
PROGRESS NOTE        PATIENT DETAILS Name: Lauren [name] Age: 42 y.o. Sex: female Date of Birth: [birth year] Admit Date: 07/19/2022 Admitting Physician Karmen Bongo, MD [id number], [name], MD  Brief Summary: Patient is a 42 y.o.  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   Significant events: 3/18>> admit to [facility] 3/20>> left fifth metatarsal excision by podiatry 3/21>> back on insulin pump, chest pain with elevated troponins.  Significant studies: 3/18>> MRI left foot: Acute osteomyelitis of residual fifth metatarsal 3/18>> CXR: No PNA  Significant microbiology data: 3/18>> blood culture: No growth 3/20>> left fourth metatarsal: No growth  Procedures: 3/20>> left fifth metatarsal excision, bone biopsy of fourth metatarsal.  Consults: Podiatry Vascular surgery ID Cards  Subjective:   Patient in bed, appears comfortable, denies any headache, no fever, no chest pain or pressure, no shortness of breath , no abdominal pain. No new focal weakness.   Objective: Vitals: Blood pressure (!) 151/93, pulse 86, temperature 98.2 F (36.8 C), temperature source Oral, resp. rate 16, height 5\' 5"  (1.651 m), weight 88.5 kg, last menstrual period 07/18/2022, SpO2 100 %.      07/25/2022    7:53 AM 07/25/2022    4:24 AM 07/25/2022   12:03 AM  Vitals with BMI  Systolic [id number]  Diastolic 93 90 79  Pulse 86 79 95    Exam:  Awake Alert, No new F.N deficits, chronic left-sided weakness and facial droop from previous stroke several months ago, [place].AT,PERRAL Supple Neck, No JVD,   Symmetrical Chest wall movement, Good air movement bilaterally, CTAB RRR,No Gallops,  Rubs or new Murmurs,  +ve B.Sounds, Abd Soft, No tenderness,   No Cyanosis, Clubbing or edema    Assessment/Plan:  Left diabetic foot with Acute osteomyelitis of fifth metatarsal-s/p excision of left fifth metatarsal  and bone biopsy of fourth metatarsal on 3/20 Left fourth metatarsal culture negative so far Initially on IV antibiotics-evaluated by ID and switched to Doxy/Augmentin(4 weeks) - follow up in office post DC on 08/03/22. Podiatry following-nonweightbearing to left lower extremity.   Chest pain-elevated troponins-non-STEMI versus demand ischemia Known CAD-last LHC 2015-cardiology following and contemplating left heart cath on 07/26/2022, Check echo Already on Plavix/statin/beta-blocker/Ranexa and has been on heparin drip per cardiology Await cardiology input No further chest pain since 3/21 evening.  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 Suspicion that both of these events were embolic-hence maintained on Coumadin prior to this hospitalization Coumadin reversed with vitamin K-postoperatively-restarted on Coumadin /Lovenox bridge.  INR slowly creeping back up. Note-extensive prior workup including hypercoagulable workup negative Reviewed outpatient neurology note-no documentation of A-fib-Per neurology-no anticoagulation from their point of view, however  given to embolic events-hematology has maintained patient on anticoagulation.  This MD spoke with primary hematologist Dr Chryl Heck on 3/19-she has arranged for a second opinion at [facility] hematology.  Plan is to continue full anticoagulation until seen by hematology for a second opinion.  CKD stage IV Close to baseline Follow electrolytes Avoid nephrotoxic agents  Normocytic anemia Baseline anemia due to CKD-likely worsened due to acute illness No evidence of blood loss Follow-and transfuse if Hb <7  HLD Statin  Hypothyroidism Synthroid  Hypertension.  Blood pressure slightly more elevated than desired, have increased hydralazine dose, continue beta-blocker, Imdur, as before.  Monitor and adjust.    DM-2 (A1C 7.0 on 3/18) with uncontrolled hyperglycemia Insulin pump being continued, continue to watch CBGs closely.  Overall better controlled-per patient-she does not do well with basal/bolus regimen.  Recent Labs    07/24/22 1704 07/24/22 2132 07/25/22 0305  GLUCAP 117* 143* 99   Obesity: Estimated body mass index is 32.45 kg/m as calculated from the following:   Height as of this encounter: 5\' 5"  (1.651 m).   Weight as of this encounter: 88.5 kg.   Code status:   Code Status: Full Code (3/20>> wanted to revoke DNR-full code order entered)  DVT Prophylaxis:    Family Communication: None at bedside   Disposition Plan: Status is: Inpatient Remains inpatient appropriate because: Severity of illness   Planned Discharge Destination:Home health over the next several days.   Diet: Diet Order             Diet heart healthy/carb modified Room service appropriate? Yes; Fluid consistency: Thin  Diet effective now                   MEDICATIONS: Scheduled Meds:  amoxicillin-clavulanate  1 tablet Oral BID   ascorbic acid  500 mg Oral Daily   atorvastatin  40 mg Oral Daily   Chlorhexidine Gluconate Cloth  6 each Topical Q0600   clopidogrel  75 mg Oral Daily   docusate  sodium  100 mg Oral BID   doxycycline  100 mg Oral Q12H   hydrALAZINE  25 mg Oral Q8H   insulin pump   Subcutaneous TID WC, HS, 0200   isosorbide mononitrate  20 mg Oral BID   levothyroxine  125 mcg Oral Q0600   magnesium oxide  400 mg Oral Daily   metoprolol succinate  50 mg Oral Daily   multivitamin with minerals  1 tablet Oral Daily   pantoprazole  40 mg Oral Daily   ranolazine  500 mg Oral BID   scopolamine  1 patch Transdermal Q72H   tiZANidine  4 mg Oral QHS   traZODone  50 mg Oral QHS   zinc sulfate  220 mg Oral Daily   Continuous Infusions:  heparin 1,250 Units/hr (07/25/22 0554)   PRN Meds:.acetaminophen **OR** acetaminophen, alum & mag hydroxide-simeth, bisacodyl, hydrALAZINE, ondansetron **OR** ondansetron (ZOFRAN) IV, oxyCODONE, polyethylene glycol, prochlorperazine   I have personally reviewed following labs and imaging studies  LABORATORY DATA:  Recent Labs  Lab 07/20/22 0127 07/21/22 0201 07/22/22 0232 07/24/22 0640 07/25/22 0205  WBC 11.7* 11.2* 14.1* 10.2 9.9  HGB 8.1* 7.7* 9.3* 9.0* 8.8*  HCT 25.8* 24.0* 27.8* 28.1* 27.5*  PLT 446* 443* 588* 614* 574*  MCV 89.0 89.2 88.0 88.4 89.0  MCH 27.9 28.6 29.4 28.3 28.5  MCHC 31.4 32.1 33.5 32.0 32.0  RDW 14.0 13.8 13.8 14.1 14.2    Recent Labs  Lab 07/19/22 0918 07/19/22 1451 07/19/22  1459 07/20/22 0127 07/21/22 0201 07/22/22 0232 07/23/22 0311 07/24/22 0640 07/25/22 0205  NA  --   --   --    < > 136 131* 134* 136 136  K  --   --   --    < > 4.1 5.0 4.0 3.9 4.7  CL  --   --   --    < > 107 104 105 104 106  CO2  --   --   --    < > 21* 16* 21* 21* 23  ANIONGAP  --   --   --    < > 8 11 8 11 7   GLUCOSE  --   --   --    < > 107* 463* 97 108* 78  BUN  --   --   --    < > 45* 46* 42* 35* 34*  CREATININE  --   --   --    < > 2.73* 2.74* 2.65* 2.41* 2.30*  CRP  --  6.1*  --   --   --   --   --  1.9* 0.9  PROCALCITON  --   --   --   --   --   --   --  0.57 0.55  LATICACIDVEN 0.7  --   --   --   --   --    --   --   --   INR  --   --   --    < > 1.5* 1.2 1.8* 1.8* 1.6*  HGBA1C  --   --  7.0*  --   --   --   --   --   --   BNP  --   --   --   --   --   --   --  2,038.3* 1,223.4*  MG  --   --   --   --   --   --   --  2.4 2.4  CALCIUM  --   --   --    < > 8.1* 8.0* 8.3* 8.3* 8.1*   < > = values in this interval not displayed.   RADIOLOGY STUDIES/RESULTS: ECHOCARDIOGRAM COMPLETE  Result Date: 07/24/2022    ECHOCARDIOGRAM REPORT   Patient Name:   PHILIS GELO Date of Exam: 07/24/2022 Medical Rec #:  [id number]       Height:       65.0 in Accession #:    [id number]      Weight:       195.0 lb Date of Birth:  08/25/1980       BSA:          1.957 m Patient Age:    83 years        BP:           144/79 mmHg Patient Gender: F               HR:           97 bpm. Exam Location:  Inpatient Procedure: 2D Echo, Cardiac Doppler, Color Doppler, 3D Echo and Intracardiac            Opacification Agent Indications:    Chest pain R07.9  History:        Patient has prior history of Echocardiogram examinations, most                 recent 01/14/2022. Carotid Disease, PAD, CKD4 and Stroke;  Risk                 Factors:Hypertension, Dyslipidemia and Former Smoker.  Sonographer:    Wilkie Aye RVT RCS Referring Phys: [place]  Sonographer Comments: Technically challenging study due to limited acoustic windows, Technically difficult study due to poor echo windows, suboptimal apical window and suboptimal parasternal window. IMPRESSIONS  1. Left ventricular ejection fraction, by estimation, is 30 to 35%. The left ventricle has moderately decreased function. The left ventricle demonstrates regional wall motion abnormalities (see scoring diagram/findings for description). There is moderate left ventricular hypertrophy. Left ventricular diastolic parameters are indeterminate.  2. Right ventricular systolic function is mildly reduced. The right ventricular size is normal. There is mildly elevated pulmonary artery systolic  pressure. The estimated right ventricular systolic pressure is Q000111Q mmHg.  3. Left atrial size was moderately dilated.  4. The mitral valve is grossly normal. Mild to moderate mitral valve regurgitation.  5. The aortic valve was not well visualized. Aortic valve regurgitation is not visualized. Aortic valve mean gradient measures 2.0 mmHg.  6. The inferior vena cava is normal in size with greater than 50% respiratory variability, suggesting right atrial pressure of 3 mmHg. Comparison(s): Prior images reviewed side by side. LVEF has decreased in comparison, 30-35% range with wall motion abnormalities. Mitral regurgitation mild to moderate. FINDINGS  Left Ventricle: Left ventricular ejection fraction, by estimation, is 30 to 35%. The left ventricle has moderately decreased function. The left ventricle demonstrates regional wall motion abnormalities. Definity contrast agent was given IV to delineate the left ventricular endocardial borders. The left ventricular internal cavity size was normal in size. There is moderate left ventricular hypertrophy. Left ventricular diastolic parameters are indeterminate.  LV Wall Scoring: The mid and distal anterior septum, apical lateral segment, apical inferior segment, and apex are akinetic. The inferior wall, basal anteroseptal segment, mid inferolateral segment, mid inferoseptal segment, apical anterior segment, and basal inferoseptal segment are hypokinetic. The anterior wall, antero-lateral wall, and basal inferolateral segment are normal. Right Ventricle: The right ventricular size is normal. No increase in right ventricular wall thickness. Right ventricular systolic function is mildly reduced. There is mildly elevated pulmonary artery systolic pressure. The tricuspid regurgitant velocity  is 2.92 m/s, and with an assumed right atrial pressure of 3 mmHg, the estimated right ventricular systolic pressure is Q000111Q mmHg. Left Atrium: Left atrial size was moderately dilated. Right  Atrium: Right atrial size was normal in size. Pericardium: There is no evidence of pericardial effusion. Mitral Valve: The mitral valve is grossly normal. Mild to moderate mitral valve regurgitation. Tricuspid Valve: The tricuspid valve is grossly normal. Tricuspid valve regurgitation is trivial. Aortic Valve: The aortic valve was not well visualized. Aortic valve regurgitation is not visualized. Aortic valve mean gradient measures 2.0 mmHg. Aortic valve peak gradient measures 2.9 mmHg. Aortic valve area, by VTI measures 2.27 cm. Pulmonic Valve: The pulmonic valve was not well visualized. Pulmonic valve regurgitation is trivial. Aorta: The aortic root is normal in size and structure. Venous: The inferior vena cava is normal in size with greater than 50% respiratory variability, suggesting right atrial pressure of 3 mmHg. IAS/Shunts: No atrial level shunt detected by color flow Doppler.  LEFT VENTRICLE PLAX 2D LVIDd:         5.20 cm      Diastology LVIDs:         4.20 cm      LV e' medial:   5.63 cm/s LV PW:  1.80 cm      LV E/e' medial: 17.7 LV IVS:        1.10 cm LVOT diam:     1.70 cm LV SV:         35 LV SV Index:   18 LVOT Area:     2.27 cm  LV Volumes (MOD) LV vol d, MOD A2C: 202.0 ml LV vol s, MOD A2C: 133.0 ml LV SV MOD A2C:     69.0 ml RIGHT VENTRICLE             IVC RV Basal diam:  3.60 cm     IVC diam: 2.10 cm RV S prime:     10.50 cm/s TAPSE (M-mode): 1.5 cm LEFT ATRIUM             Index        RIGHT ATRIUM           Index LA diam:        4.10 cm 2.10 cm/m   RA Area:     13.00 cm LA Vol (A2C):   65.3 ml 33.37 ml/m  RA Volume:   27.30 ml  13.95 ml/m LA Vol (A4C):   88.8 ml 45.38 ml/m LA Biplane Vol: 76.4 ml 39.04 ml/m  AORTIC VALVE                    PULMONIC VALVE AV Area (Vmax):    2.49 cm     PV Vmax:       0.71 m/s AV Area (Vmean):   2.43 cm     PV Peak grad:  2.0 mmHg AV Area (VTI):     2.27 cm AV Vmax:           84.70 cm/s AV Vmean:          56.300 cm/s AV VTI:            0.156 m AV  Peak Grad:      2.9 mmHg AV Mean Grad:      2.0 mmHg LVOT Vmax:         93.00 cm/s LVOT Vmean:        60.300 cm/s LVOT VTI:          0.156 m LVOT/AV VTI ratio: 1.00  AORTA Ao Root diam: 2.80 cm Ao Asc diam:  3.10 cm Ao Arch diam: 2.9 cm MITRAL VALVE               TRICUSPID VALVE MV Area (PHT): 6.22 cm    TR Peak grad:   34.1 mmHg MV Decel Time: 122 msec    TR Vmax:        292.00 cm/s MV E velocity: 99.70 cm/s MV A velocity: 47.50 cm/s  SHUNTS MV E/A ratio:  2.10        Systemic VTI:  0.16 m                            Systemic Diam: 1.70 cm Rozann Lesches MD Electronically signed by Rozann Lesches MD Signature Date/Time: 07/24/2022/5:07:35 PM    Final      LOS: 6 days   Signature  -    Lala Lund M.D on 07/25/2022 at 7:55 AM   -  To page go to [email or web address]

## 2022-07-25 NOTE — Progress Notes (Signed)
[place] for  heparin Indication: NSTEMI and history of stroke and arterial thrombosis Brief A/P: Heparin level within goal range Continue Heparin at current rate    Allergies  Allergen Reactions   Visipaque [Iodixanol] Nausea And Vomiting    Patient had vagal response during procedure became hypertensive, and bradycardic. CO2 used during procedure prior to contrast being used, this may be cause as well. Recommended premedicating prior to contrast being used in the future.    Wellbutrin [Bupropion] Hives   Cefepime Rash    Tolerates penicilllin   Ciprofloxacin Hcl Hives and Rash    Hives/rash at injection site; 01/15/22 tolerated IV cipro   Tape Rash    Patient Measurements: Height: 5\' 5"  (165.1 cm) Weight: 88.5 kg (195 lb) IBW/kg (Calculated) : 57 Heparin dosing weight: 76.4 kg  Vital Signs: Temp: 98.1 F (36.7 C) (03/24 1921) Temp Source: Oral (03/24 1921) BP: 141/81 (03/24 1921) Pulse Rate: 88 (03/24 1921)  Labs: Recent Labs    07/23/22 0311 07/24/22 0640 07/24/22 1304 07/25/22 0205 07/25/22 1303 07/25/22 2250  HGB  --  9.0*  --  8.8*  --   --   HCT  --  28.1*  --  27.5*  --   --   PLT  --  614*  --  574*  --   --   LABPROT 20.3* 20.6*  --  18.6*  --   --   INR 1.8* 1.8*  --  1.6*  --   --   HEPARINUNFRC  --  0.32   < > 0.11* 0.19* 0.40  CREATININE 2.65* 2.41*  --  2.30*  --   --    < > = values in this interval not displayed.     Estimated Creatinine Clearance: 35.7 mL/min (A) (by C-G formula based on SCr of 2.3 mg/dL (H)).   Assessment: 42 y.o. female with NSTEMI,  h/o CVA and arterial thrombosis, Coumadin on hold, for heparin  Goal of Therapy:  Heparin level 0.3-0.7 units/ml Monitor platelets by anticoagulation protocol: Yes   Plan:  No change to heparin  Follow-up am labs.    Phillis Knack, PharmD, BCPS

## 2022-07-25 NOTE — Progress Notes (Signed)
[place] for IV heparin Indication: hx stroke w/ arterial thrombosis on apixaban; new NSTEMI  Allergies  Allergen Reactions   Visipaque [Iodixanol] Nausea And Vomiting    Patient had vagal response during procedure became hypertensive, and bradycardic. CO2 used during procedure prior to contrast being used, this may be cause as well. Recommended premedicating prior to contrast being used in the future.    Wellbutrin [Bupropion] Hives   Cefepime Rash    Tolerates penicilllin   Ciprofloxacin Hcl Hives and Rash    Hives/rash at injection site; 01/15/22 tolerated IV cipro   Tape Rash    Patient Measurements: Height: 5\' 5"  (165.1 cm) Weight: 88.5 kg (195 lb) IBW/kg (Calculated) : 57 Heparin dosing weight: 76.4 kg  Vital Signs: Temp: 98.2 F (36.8 C) (03/24 0753) Temp Source: Oral (03/24 0753) BP: 151/93 (03/24 0753) Pulse Rate: 86 (03/24 0753)  Labs: Recent Labs    07/22/22 1736 07/22/22 1842 07/22/22 2110 07/23/22 0311 07/24/22 0640 07/24/22 0640 07/24/22 1304 07/25/22 0205 07/25/22 1303  HGB  --   --   --   --  9.0*  --   --  8.8*  --   HCT  --   --   --   --  28.1*  --   --  27.5*  --   PLT  --   --   --   --  614*  --   --  574*  --   LABPROT  --   --   --  20.3* 20.6*  --   --  18.6*  --   INR  --   --   --  1.8* 1.8*  --   --  1.6*  --   HEPARINUNFRC  --   --   --   --  0.32   < > 0.31 0.11* 0.19*  CREATININE  --   --   --  2.65* 2.41*  --   --  2.30*  --   TROPONINIHS 1,198* 1,199* 1,210*  --   --   --   --   --   --    < > = values in this interval not displayed.     Estimated Creatinine Clearance: 35.7 mL/min (A) (by C-G formula based on SCr of 2.3 mg/dL (H)).   Assessment: 42 yo F presents with L foot osteomyelitis on 07/19/22. Pt has a history of stroke w/ aterial thrombus while on apixaban therapy and was transitioned to warfarin for this reason per hematology and neurology. Warfarin was reversed w/ IV vitamin K 5mg   on 3/19 for INR 3.4. S/p metatarsal excision and debridement w/ bone biopsy on 3/20 and pharmacy was consulted to dose warfarin w/ enoxaparin bridge that evening. Last dose of warfarin given on 3/21. Last dose of enoxaparin 3/22 @ 0900. Pharmacy consulted to transition warfarin/enoxaparin to IV heparin for new ACS/NSTEMI pending work-up.   PTA warfarin regimen: warfarin 5mg  daily except for 7.5mg  on Wed and Sat  Heparin level is subtherapeutic at 0.19 on 1250 units/hr. INR down to 1.6. No bleeding noted, Hgb stable, platelets are elevated. Spoke with phlebotomy who states left arm is restricted per patient. Heparin was drawn from R arm where heparin infusing and heparin was not paused for draw.  Goal of Therapy:  Heparin level 0.3-0.7 units/ml Monitor platelets by anticoagulation protocol: Yes   Plan:  Increase heparin drip to 1500 units/hr (no bolus) 8 hr heparin level Daily heparin level, CBC F/u  after cath on Mon and ability to resume warfarin  Thank you for involving pharmacy in this patient's care.  Renold Genta, PharmD, BCPS Clinical Pharmacist Clinical phone for 07/25/2022 is [phone] 07/25/2022 1:54 PM

## 2022-07-25 NOTE — Plan of Care (Signed)

## 2022-07-25 NOTE — Progress Notes (Signed)
   PODIATRY PROGRESS NOTE  NAME Lauren [name] MRN [id number] DOB Aug 11, 1980 DOA 07/19/2022   Reason for consult:  Chief Complaint  Patient presents with   Chest Pain     History of present illness: 42 y.o. female POD #4 s/p left 5th ray resection and bone biopsy of the 4th metatarsal, wound excision. Plan for [facility] Monday.  Denies any fevers or chills.  No chest pain.  Vitals:   07/25/22 0424 07/25/22 0753  BP: (!) 125/90 (!) 151/93  Pulse: 79 86  Resp: 20 16  Temp: 97.6 F (36.4 C) 98.2 F (36.8 C)  SpO2: 98% 100%     CBC    Component Value Date/Time   WBC 9.9 07/25/2022 0205   RBC 3.09 (L) 07/25/2022 0205   HGB 8.8 (L) 07/25/2022 0205   HGB 11.0 (L) 04/20/2022 1022   HCT 27.5 (L) 07/25/2022 0205   HCT 36.0 04/20/2022 1022   PLT 574 (H) 07/25/2022 0205   PLT 415 04/20/2022 1022   MCV 89.0 07/25/2022 0205   MCV 90 04/20/2022 1022   MCH 28.5 07/25/2022 0205   MCHC 32.0 07/25/2022 0205   RDW 14.2 07/25/2022 0205   RDW 14.5 04/20/2022 1022   LYMPHSABS 2.3 05/08/2022 2150   LYMPHSABS 1.6 04/20/2022 1022   MONOABS 0.6 05/08/2022 2150   EOSABS 0.2 05/08/2022 2150   EOSABS 0.2 04/20/2022 1022   BASOSABS 0.0 05/08/2022 2150   BASOSABS 0.0 04/20/2022 1022    Physical Exam: General: AAOx3, NAD  Dermatology: Dressing change.  Incision coapted.  Sutures intact.  Tenderness for tissue on the central aspect.  Cellulitis much improved compared to what it was prior to surgery.  There is no fluctuance or crepitation and there is no malodor.      Vascular: Foot is warm and perfused  Neurological: Sensation decreased  Musculoskeletal Exam: No pain on exam    ASSESSMENT/PLAN OF CARE  POD # 3 /p left foot fifth ray resection, bone biopsy fourth metatarsal with wound excision  -Dressing changed today.  Betadine applied to the incision followed by adry sterile dressing. -Nonweightbearing left lower extremity -Culture 07/22/2022- NGTD -Pathology 07/22/2022- 4th  metatarsal benign bone -Pathology 3/21 5th metatarsal with osteomyelitis  -Appreciate ID input- continue antibiotics -Podiatry will continue to follow while inpatient.  May be discharged from our standpoint once medically cleared.     Please contact me directly with any questions or concerns.     Lauren [name], Lauren [name] [facility]  Dr. Bonna Gains. [address], [address] [place], [place] [address]                Office [phone]  Fax [phone]

## 2022-07-26 ENCOUNTER — Encounter (HOSPITAL_COMMUNITY)
Admission: EM | Disposition: A | Discharge status: Home / Self Care | Payer: Self-pay | Source: Home / Self Care | Attending: Internal Medicine

## 2022-07-26 ENCOUNTER — Ambulatory Visit (HOSPITAL_BASED_OUTPATIENT_CLINIC_OR_DEPARTMENT_OTHER): Payer: BC Managed Care – PPO | Admitting: General Surgery

## 2022-07-26 ENCOUNTER — Encounter (HOSPITAL_COMMUNITY): Payer: Self-pay | Admitting: Cardiology

## 2022-07-26 DIAGNOSIS — L089 Local infection of the skin and subcutaneous tissue, unspecified: Secondary | ICD-10-CM | POA: Diagnosis not present

## 2022-07-26 DIAGNOSIS — I2510 Atherosclerotic heart disease of native coronary artery without angina pectoris: Secondary | ICD-10-CM | POA: Diagnosis not present

## 2022-07-26 DIAGNOSIS — E11628 Type 2 diabetes mellitus with other skin complications: Secondary | ICD-10-CM | POA: Diagnosis not present

## 2022-07-26 DIAGNOSIS — I214 Non-ST elevation (NSTEMI) myocardial infarction: Secondary | ICD-10-CM | POA: Diagnosis not present

## 2022-07-26 HISTORY — PX: LEFT HEART CATH AND CORONARY ANGIOGRAPHY: CATH118249

## 2022-07-26 LAB — AEROBIC/ANAEROBIC CULTURE W GRAM STAIN (SURGICAL/DEEP WOUND)
Culture: NO GROWTH
Gram Stain: NONE SEEN

## 2022-07-26 LAB — BASIC METABOLIC PANEL
Anion gap: 7 (ref 5–15)
BUN: 35 mg/dL — ABNORMAL HIGH (ref 6–20)
CO2: 21 mmol/L — ABNORMAL LOW (ref 22–32)
Calcium: 8.3 mg/dL — ABNORMAL LOW (ref 8.9–10.3)
Chloride: 106 mmol/L (ref 98–111)
Creatinine, Ser: 2.33 mg/dL — ABNORMAL HIGH (ref 0.44–1.00)
GFR, Estimated: 26 mL/min — ABNORMAL LOW (ref >60)
Glucose, Bld: 114 mg/dL — ABNORMAL HIGH (ref 70–99)
Potassium: 4.7 mmol/L (ref 3.5–5.1)
Sodium: 134 mmol/L — ABNORMAL LOW (ref 135–145)

## 2022-07-26 LAB — GLUCOSE, CAPILLARY
Glucose-Capillary: 108 mg/dL — ABNORMAL HIGH (ref 70–99)
Glucose-Capillary: 259 mg/dL — ABNORMAL HIGH (ref 70–99)
Glucose-Capillary: 274 mg/dL — ABNORMAL HIGH (ref 70–99)
Glucose-Capillary: 230 mg/dL — ABNORMAL HIGH (ref 70–99)
Glucose-Capillary: 278 mg/dL — ABNORMAL HIGH (ref 70–99)
Glucose-Capillary: 454 mg/dL — ABNORMAL HIGH (ref 70–99)

## 2022-07-26 LAB — LIPID PANEL
Cholesterol: 118 mg/dL (ref 0–200)
HDL: 44 mg/dL (ref >40)
LDL Cholesterol: 57 mg/dL (ref 0–99)
Total CHOL/HDL Ratio: 2.7 RATIO
Triglycerides: 84 mg/dL (ref <150)
VLDL: 17 mg/dL (ref 0–40)

## 2022-07-26 LAB — CBC
HCT: 29.9 % — ABNORMAL LOW (ref 36.0–46.0)
Hemoglobin: 9.5 g/dL — ABNORMAL LOW (ref 12.0–15.0)
MCH: 27.9 pg (ref 26.0–34.0)
MCHC: 31.8 g/dL (ref 30.0–36.0)
MCV: 87.7 fL (ref 80.0–100.0)
Platelets: 633 10*3/uL — ABNORMAL HIGH (ref 150–400)
RBC: 3.41 MIL/uL — ABNORMAL LOW (ref 3.87–5.11)
RDW: 14.3 % (ref 11.5–15.5)
WBC: 11.5 10*3/uL — ABNORMAL HIGH (ref 4.0–10.5)
nRBC: 0 % (ref 0.0–0.2)

## 2022-07-26 LAB — PROTIME-INR
INR: 1.2 (ref 0.8–1.2)
Prothrombin Time: 15.1 seconds (ref 11.4–15.2)

## 2022-07-26 LAB — HEPARIN LEVEL (UNFRACTIONATED): Heparin Unfractionated: 0.28 IU/mL — ABNORMAL LOW (ref 0.30–0.70)

## 2022-07-26 LAB — BRAIN NATRIURETIC PEPTIDE: B Natriuretic Peptide: 1226.3 pg/mL — ABNORMAL HIGH (ref 0.0–100.0)

## 2022-07-26 LAB — MAGNESIUM: Magnesium: 2.5 mg/dL — ABNORMAL HIGH (ref 1.7–2.4)

## 2022-07-26 LAB — PROCALCITONIN: Procalcitonin: 0.47 ng/mL

## 2022-07-26 LAB — C-REACTIVE PROTEIN: CRP: 0.5 mg/dL (ref <1.0)

## 2022-07-26 SURGERY — LEFT HEART CATH AND CORONARY ANGIOGRAPHY
Anesthesia: LOCAL

## 2022-07-26 MED ORDER — SODIUM CHLORIDE 0.9 % WEIGHT BASED INFUSION
3.0000 mL/kg/h | INTRAVENOUS | Status: DC
  Administered 2022-07-26: 3 mL/kg/h via INTRAVENOUS

## 2022-07-26 MED ORDER — FENTANYL CITRATE (PF) 100 MCG/2ML IJ SOLN
INTRAMUSCULAR | Status: AC
  Filled 2022-07-26: qty 2

## 2022-07-26 MED ORDER — LIDOCAINE-EPINEPHRINE 1 %-1:100000 IJ SOLN
30.0000 mL | Freq: Once | INTRAMUSCULAR | Status: AC
  Administered 2022-07-26: 3 mL

## 2022-07-26 MED ORDER — FENTANYL CITRATE (PF) 100 MCG/2ML IJ SOLN
INTRAMUSCULAR | Status: DC | PRN
  Administered 2022-07-26: 25 ug via INTRAVENOUS

## 2022-07-26 MED ORDER — WARFARIN SODIUM 7.5 MG PO TABS
7.5000 mg | ORAL_TABLET | Freq: Once | ORAL | Status: AC
  Administered 2022-07-26: 7.5 mg via ORAL
  Filled 2022-07-26: qty 1

## 2022-07-26 MED ORDER — SODIUM CHLORIDE 0.9% FLUSH
3.0000 mL | Freq: Two times a day (BID) | INTRAVENOUS | Status: DC
  Administered 2022-07-26 (×2): 3 mL via INTRAVENOUS

## 2022-07-26 MED ORDER — IOHEXOL 350 MG/ML SOLN
INTRAVENOUS | Status: DC | PRN
  Administered 2022-07-26: 35 mL via INTRA_ARTERIAL

## 2022-07-26 MED ORDER — LIDOCAINE HCL (PF) 1 % IJ SOLN
INTRAMUSCULAR | Status: DC | PRN
  Administered 2022-07-26: 10 mL

## 2022-07-26 MED ORDER — MIDAZOLAM HCL 2 MG/2ML IJ SOLN
INTRAMUSCULAR | Status: DC | PRN
  Administered 2022-07-26: 2 mg via INTRAVENOUS

## 2022-07-26 MED ORDER — MIDAZOLAM HCL 2 MG/2ML IJ SOLN
INTRAMUSCULAR | Status: AC
  Filled 2022-07-26: qty 2

## 2022-07-26 MED ORDER — SODIUM CHLORIDE 0.9 % IV SOLN: 250.0000 mL | INTRAVENOUS | Status: DC | PRN

## 2022-07-26 MED ORDER — LIDOCAINE-EPINEPHRINE 1 %-1:100000 IJ SOLN
INTRAMUSCULAR | Status: AC
  Filled 2022-07-26: qty 1

## 2022-07-26 MED ORDER — LIDOCAINE HCL (PF) 1 % IJ SOLN
INTRAMUSCULAR | Status: AC
  Filled 2022-07-26: qty 30

## 2022-07-26 MED ORDER — HEPARIN (PORCINE) 25000 UT/250ML-% IV SOLN
1600.0000 [IU]/h | INTRAVENOUS | Status: DC
  Administered 2022-07-26: 1600 [IU]/h via INTRAVENOUS
  Filled 2022-07-26: qty 250

## 2022-07-26 MED ORDER — SODIUM CHLORIDE 0.9% FLUSH: 3.0000 mL | INTRAVENOUS | Status: DC | PRN

## 2022-07-26 MED ORDER — SODIUM CHLORIDE 0.9% FLUSH
3.0000 mL | Freq: Two times a day (BID) | INTRAVENOUS | Status: DC
  Administered 2022-07-26: 3 mL via INTRAVENOUS

## 2022-07-26 MED ORDER — HEPARIN SODIUM (PORCINE) 5000 UNIT/ML IJ SOLN: 5000.0000 [IU] | Freq: Three times a day (TID) | INTRAMUSCULAR | Status: DC

## 2022-07-26 MED ORDER — HEPARIN (PORCINE) IN NACL 1000-0.9 UT/500ML-% IV SOLN
INTRAVENOUS | Status: DC | PRN
  Administered 2022-07-26 (×2): 500 mL

## 2022-07-26 MED ORDER — SODIUM CHLORIDE 0.9 % WEIGHT BASED INFUSION
1.0000 mL/kg/h | INTRAVENOUS | Status: DC
  Administered 2022-07-26: 1 mL/kg/h via INTRAVENOUS

## 2022-07-26 MED ORDER — SODIUM CHLORIDE 0.9 % WEIGHT BASED INFUSION: 1.0000 mL/kg/h | INTRAVENOUS | Status: AC

## 2022-07-26 MED ORDER — WARFARIN - PHARMACIST DOSING INPATIENT: Freq: Every day | Status: DC

## 2022-07-26 SURGICAL SUPPLY — 8 items
CATH INFINITI 5FR AL1 (CATHETERS) IMPLANT
CATH INFINITI 5FR MULTPACK ANG (CATHETERS) IMPLANT
KIT HEART LEFT (KITS) ×2 IMPLANT
PACK CARDIAC CATHETERIZATION (CUSTOM PROCEDURE TRAY) ×2 IMPLANT
SHEATH PINNACLE 5F 10CM (SHEATH) IMPLANT
SHEATH PROBE COVER 6X72 (BAG) IMPLANT
TRANSDUCER W/STOPCOCK (MISCELLANEOUS) ×2 IMPLANT
WIRE EMERALD 3MM-J .035X150CM (WIRE) IMPLANT

## 2022-07-26 NOTE — Interval H&P Note (Signed)
History and Physical Interval Note:  07/26/2022 10:24 AM  Lauren [name]  has presented today for surgery, with the diagnosis of chest pain.  The various methods of treatment have been discussed with the patient and family. After consideration of risks, benefits and other options for treatment, the patient has consented to  Procedure(s): LEFT HEART CATH AND CORONARY ANGIOGRAPHY (N/A) as a surgical intervention.  The patient's history has been reviewed, patient examined, no change in status, stable for surgery.  I have reviewed the patient's chart and labs.  Questions were answered to the patient's satisfaction.   Cath Lab Visit (complete for each Cath Lab visit)  Clinical Evaluation Leading to the Procedure:   ACS: Yes.    Non-ACS:    Anginal Classification: CCS IV  Anti-ischemic medical therapy: Maximal Therapy (2 or more classes of medications)  Non-Invasive Test Results: No non-invasive testing performed  Prior CABG: No previous CABG        Collier Salina [facility] 07/26/2022 10:24 AM

## 2022-07-26 NOTE — Progress Notes (Signed)
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 CO2 used during procedure prior to contrast being used, this may be cause as well. Recommended premedicating prior to contrast being used in the future.    Wellbutrin [Bupropion] Hives   Cefepime Rash    Tolerates penicilllin   Ciprofloxacin Hcl Hives and Rash    Hives/rash at injection site; 01/15/22 tolerated IV cipro   Tape Rash    Patient Measurements: Height: 5\' 5"  (165.1 cm) Weight: 88.5 kg (195 lb) IBW/kg (Calculated) : 57 Heparin dosing weight: 76.4 kg  Vital Signs: BP: 136/83 (03/25 0402) Pulse Rate: 86 (03/25 0402)  Labs: Recent Labs    07/24/22 0640 07/24/22 1304 07/25/22 0205 07/25/22 1303 07/25/22 2250 07/26/22 0335  HGB 9.0*  --  8.8*  --   --  9.5*  HCT 28.1*  --  27.5*  --   --  29.9*  PLT 614*  --  574*  --   --  633*  LABPROT 20.6*  --  18.6*  --   --  15.1  INR 1.8*  --  1.6*  --   --  1.2  HEPARINUNFRC 0.32   < > 0.11* 0.19* 0.40 0.28*  CREATININE 2.41*  --  2.30*  --   --  2.33*   < > = values in this interval not displayed.     Estimated Creatinine Clearance: 35.3 mL/min (A) (by C-G formula based on SCr of 2.33 mg/dL (H)).   Assessment: 42 yo F presents with L foot osteomyelitis on 07/19/22. Pt has a history of stroke w/ aterial thrombus while on apixaban therapy and was transitioned to warfarin for this reason per hematology and neurology. Warfarin was reversed w/ IV vitamin K 5mg  on 3/19 for INR 3.4. S/p metatarsal excision and debridement w/ bone biopsy on 3/20 and pharmacy was consulted to dose warfarin w/ enoxaparin bridge that evening. Last dose of warfarin given on 3/21. Last dose of enoxaparin 3/22 @ 0900. Pharmacy consulted to transition  warfarin/enoxaparin to IV heparin for new ACS/NSTEMI pending work-up.   PTA warfarin regimen: warfarin 5mg  daily except for 7.5mg  on Wed and Sat  Heparin level is slightly subtherapeutic at 0.28 after rate increase to 1500 units/hr yesterday. INR 1.2. CBC stable. No issues with heparin infusing per RN.   Goal of Therapy:  Heparin level 0.3-0.7 units/ml Monitor platelets by anticoagulation protocol: Yes   Plan:  Increase heparin drip to 1600 units/hr (no bolus) 6 hr heparin level Daily heparin level, CBC F/u after cath on Mon and ability to resume warfarin  Thank you for involving pharmacy in this patient's care.  Dimple Nanas, PharmD, BCPS 07/26/2022 7:26 AM

## 2022-07-26 NOTE — Progress Notes (Signed)
[facility] night cross cover note:   I was notified by RN of patient's CBG of 454 per [facility] cbg. Patient is on her home insulin pump, and refuses any supplemental subcutaneous insulin at this time. Will continue existing CBG monitoring, and will provide supplemental sliding scale subcutaneous insulin if patient subsequently reconsiders.     Babs Bertin, DO Hospitalist

## 2022-07-26 NOTE — Progress Notes (Signed)
Site area: Right groin a 5 french arterial sheath was removed  Site Prior to Removal:  Level 0  Pressure Applied For 60 MINUTES    Bedrest Beginning at 1250 X 4 hours  Manual:   Yes.    Patient Status During Pull:  stable  Post Pull Groin Site:  Level 0  Post Pull Instructions Given:  Yes.    Post Pull Pulses Present:  Yes.    Dressing Applied:  Yes.    Comments:  Lido/with epi applied with good results per MD order.

## 2022-07-26 NOTE — Progress Notes (Signed)
   Patient Name: Lauren [name] Date of Encounter: 07/26/2022 [place] Cardiologist: Carlyle Dolly, MD   Interval Summary   No chest pain.  No SOB  Vital Signs   Vitals:   07/26/22 1159 07/26/22 1200 07/26/22 1324 07/26/22 1558  BP: (!) 145/40 (!) 145/40 (!) 145/73 105/75  Pulse: (!) 107 (!) 109  (!) 103  Resp: 19 15  18   Temp:   97.7 F (36.5 C) 97.9 F (36.6 C)  TempSrc:   Oral Axillary  SpO2: 96% 96%  97%  Weight:      Height:       No intake or output data in the 24 hours ending 07/26/22 1633    07/19/2022    5:30 AM 06/22/2022    6:32 AM 06/17/2022    2:22 PM  Last 3 Weights  Weight (lbs) 195 lb 185 lb 215 lb 12.8 oz  Weight (kg) 88.451 kg 83.915 kg 97.886 kg      Telemetry/ECG    NSR - Personally Reviewed  Physical Exam  GEN: No acute distress.   Neck: No JVD Cardiac: RRR, no murmurs, rubs, or gallops.  Respiratory: Clear to auscultation bilaterally. GI: Soft, nontender, non-distended  MS: No edema, right femoral access site without bleeding or bruising.   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  Plan continued medical management.  Disease is not amenable to revascularization.    I personally reviewed the cath films.    CKDIV:  Follow creat in AM.   Check creat in AM.   Ischemic cardiomyopathy:    She apparently has had significant orthostatic hypotension.  This precludes much in the way of med titration.     Continue current meds.   CVA:  History of thrombectomy on warfarin on admission.  Can resume this evening.   Needs heparin bridging to resume per pharmacy.  Please see the discussion from primary team rounding notes.       For questions or updates, please contact [facility] Please consult [email or web address] for contact info under      Signed, Minus Breeding, MD

## 2022-07-26 NOTE — Progress Notes (Addendum)
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 CO2 used during procedure prior to contrast being used, this may be cause as well. Recommended premedicating prior to contrast being used in the future.    Wellbutrin [Bupropion] Hives   Cefepime Rash    Tolerates penicilllin   Ciprofloxacin Hcl Hives and Rash    Hives/rash at injection site; 01/15/22 tolerated IV cipro   Tape Rash    Patient Measurements: Height: 5\' 5"  (165.1 cm) Weight: 88.5 kg (195 lb) IBW/kg (Calculated) : 57 Heparin dosing weight: 76.4 kg  Vital Signs: Temp: 97.7 F (36.5 C) (03/25 1324) Temp Source: Oral (03/25 1324) BP: 145/73 (03/25 1324) Pulse Rate: 109 (03/25 1200)  Labs: Recent Labs    07/24/22 0640 07/24/22 1304 07/25/22 0205 07/25/22 1303 07/25/22 2250 07/26/22 0335  HGB 9.0*  --  8.8*  --   --  9.5*  HCT 28.1*  --  27.5*  --   --  29.9*  PLT 614*  --  574*  --   --  633*  LABPROT 20.6*  --  18.6*  --   --  15.1  INR 1.8*  --  1.6*  --   --  1.2  HEPARINUNFRC 0.32   < > 0.11* 0.19* 0.40 0.28*  CREATININE 2.41*  --  2.30*  --   --  2.33*   < > = values in this interval not displayed.     Estimated Creatinine Clearance: 35.3 mL/min (A) (by C-G formula based on SCr of 2.33 mg/dL (H)).   Assessment: 42 yo F presents with L foot osteomyelitis on 07/19/22. Pt has a history of stroke w/ aterial thrombus while on apixaban therapy and was transitioned to warfarin for this reason per hematology and neurology. Warfarin was reversed w/ IV vitamin K 5mg  on 3/19. S/p metatarsal excision and debridement w/ bone biopsy on 3/20. Pharmacy consulted to dose IV heparin for new ACS/NSTEMI pending work-up.   -heparin to restart 8 hrs post cath and warfarin to restart  tonight -sheath removed ~ 1pm  PTA warfarin regimen: warfarin 5mg  daily except for 7.5mg  on Wed and Sat  Goal of Therapy:  Heparin level 0.3-0.7 units/ml Monitor platelets by anticoagulation protocol: Yes INR goal 2-3   Plan:  Restart heparin 1600 units/hr at 9pm Heparin level and CBC in am Warfarin 7.5mg  today Daily PT/INR  Hildred Laser, PharmD Clinical Pharmacist **Pharmacist phone directory can now be found on [email or web address] ([email or web address]).  Listed under [facility].

## 2022-07-26 NOTE — Plan of Care (Signed)
  Problem: Education: Goal: Knowledge of General Education information will improve Description: Including pain rating scale, medication(s)/side effects and non-pharmacologic comfort measures Outcome: Progressing   Problem: Health Behavior/Discharge Planning: Goal: Ability to manage health-related needs will improve Outcome: Progressing   Problem: Clinical Measurements: Goal: Ability to maintain clinical measurements within normal limits will improve Outcome: Progressing Goal: Will remain free from infection Outcome: Progressing Goal: Diagnostic test results will improve Outcome: Progressing Goal: Respiratory complications will improve Outcome: Progressing Goal: Cardiovascular complication will be avoided Outcome: Progressing   Problem: Activity: Goal: Risk for activity intolerance will decrease Outcome: Progressing   Problem: Nutrition: Goal: Adequate nutrition will be maintained Outcome: Progressing   Problem: Coping: Goal: Level of anxiety will decrease Outcome: Progressing   Problem: Elimination: Goal: Will not experience complications related to bowel motility Outcome: Progressing Goal: Will not experience complications related to urinary retention Outcome: Progressing   Problem: Pain Managment: Goal: General experience of comfort will improve Outcome: Progressing   Problem: Safety: Goal: Ability to remain free from injury will improve Outcome: Progressing   Problem: Skin Integrity: Goal: Risk for impaired skin integrity will decrease Outcome: Progressing   Problem: Education: Goal: Ability to describe self-care measures that may prevent or decrease complications (Diabetes Survival Skills Education) will improve Outcome: Progressing Goal: Individualized Educational Video(s) Outcome: Progressing   Problem: Coping: Goal: Ability to adjust to condition or change in health will improve Outcome: Progressing   Problem: Fluid Volume: Goal: Ability to  maintain a balanced intake and output will improve Outcome: Progressing   Problem: Health Behavior/Discharge Planning: Goal: Ability to identify and utilize available resources and services will improve Outcome: Progressing Goal: Ability to manage health-related needs will improve Outcome: Progressing   Problem: Metabolic: Goal: Ability to maintain appropriate glucose levels will improve Outcome: Progressing   Problem: Nutritional: Goal: Maintenance of adequate nutrition will improve Outcome: Progressing Goal: Progress toward achieving an optimal weight will improve Outcome: Progressing   Problem: Skin Integrity: Goal: Risk for impaired skin integrity will decrease Outcome: Progressing   Problem: Tissue Perfusion: Goal: Adequacy of tissue perfusion will improve Outcome: Progressing   Problem: Education: Goal: Understanding of CV disease, CV risk reduction, and recovery process will improve Outcome: Progressing Goal: Individualized Educational Video(s) Outcome: Progressing   Problem: Activity: Goal: Ability to return to baseline activity level will improve Outcome: Progressing   Problem: Cardiovascular: Goal: Ability to achieve and maintain adequate cardiovascular perfusion will improve Outcome: Progressing Goal: Vascular access site(s) Level 0-1 will be maintained Outcome: Progressing   Problem: Health Behavior/Discharge Planning: Goal: Ability to safely manage health-related needs after discharge will improve Outcome: Progressing   Problem: Education: Goal: Understanding of CV disease, CV risk reduction, and recovery process will improve Outcome: Progressing Goal: Individualized Educational Video(s) Outcome: Progressing   Problem: Activity: Goal: Ability to return to baseline activity level will improve Outcome: Progressing   Problem: Cardiovascular: Goal: Ability to achieve and maintain adequate cardiovascular perfusion will improve Outcome:  Progressing Goal: Vascular access site(s) Level 0-1 will be maintained Outcome: Progressing   Problem: Health Behavior/Discharge Planning: Goal: Ability to safely manage health-related needs after discharge will improve Outcome: Progressing

## 2022-07-26 NOTE — Progress Notes (Signed)
IVT consult placed for PIV for heart catheterization procedure 3/25. Patient has infiltration in right upper arm (cephalic) and is left arm restricted, per patient due to stroke. Due to infiltration, no suitable location for peripheral line placement for procedure. Patient has PIV for heparin drip going through different vessel. Primary RN instructed to place 'restricted' bracelet on left arm and inform cath lab of assessment.   Anhad Sheeley Lorita Officer, RN

## 2022-07-26 NOTE — Progress Notes (Signed)
PROGRESS NOTE        PATIENT DETAILS Name: Lauren [name] Age: 42 y.o. Sex: female Date of Birth: 1980-08-18 Admit Date: 07/19/2022 Admitting Physician Karmen Bongo, MD [id number], [name], MD  Brief Summary: Patient is a 42 y.o.  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   Significant events: 3/18>> admit to [facility] 3/20>> left fifth metatarsal excision by podiatry 3/21>> back on insulin pump, chest pain with elevated troponins.  Significant studies: 3/18>> MRI left foot: Acute osteomyelitis of residual fifth metatarsal 3/18>> CXR: No PNA  Significant microbiology data: 3/18>> blood culture: No growth 3/20>> left fourth metatarsal: No growth  Procedures: 3/20>> left fifth metatarsal excision, bone biopsy of fourth metatarsal.  Consults: Podiatry Vascular surgery ID Cards  Subjective:   Patient in bed, appears comfortable, denies any headache, no fever, no chest pain or pressure, no shortness of breath , no abdominal pain. No new focal weakness.  Objective: Vitals: Blood pressure 136/83, pulse 86, temperature 98.1 F (36.7 C), temperature source Oral, resp. rate 18, height 5\' 5"  (1.651 m), weight 88.5 kg, last menstrual period 07/18/2022, SpO2 100 %.      07/26/2022    4:02 AM 07/25/2022    7:21 PM 07/25/2022    4:25 PM  Vitals with BMI  Systolic [id number]  Diastolic 83 81 99  Pulse 86 88 95    Exam:  Awake Alert, No new F.N deficits, chronic left-sided weakness and facial droop from previous stroke several months ago, [place].AT,PERRAL Supple Neck, No JVD,   Symmetrical Chest wall movement, Good air movement bilaterally, CTAB RRR,No Gallops, Rubs  or new Murmurs,  +ve B.Sounds, Abd Soft, No tenderness,   No Cyanosis, Clubbing or edema    Assessment/Plan:  Left diabetic foot with Acute osteomyelitis of fifth metatarsal-s/p excision of left fifth metatarsal  and bone biopsy of fourth metatarsal on 3/20 Left fourth metatarsal culture negative so far Initially on IV antibiotics-evaluated by ID and switched to Doxy/Augmentin(4 weeks) - follow up in office post DC on 08/03/22. Podiatry following-nonweightbearing to left lower extremity.   Chest pain-elevated troponins-non-STEMI versus demand ischemia Known CAD-last LHC 2015-cardiology following and contemplating left heart cath on 07/26/2022, Check echo Already on Plavix/statin/beta-blocker/Ranexa and has been on heparin drip per cardiology Await cardiology input No further chest pain since 3/21 evening.  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 Suspicion that both of these events were embolic-hence maintained on Coumadin prior to this hospitalization Coumadin reversed with vitamin K-postoperatively-restarted on Coumadin /Lovenox bridge.  INR slowly creeping back up. Note-extensive prior workup including hypercoagulable workup negative Reviewed outpatient neurology note-no documentation of A-fib-Per neurology-no anticoagulation from their point of view, however given to  embolic events-hematology has maintained patient on anticoagulation.  This MD spoke with primary hematologist Dr Chryl Heck on 3/19-she has arranged for a second opinion at [facility] hematology.  Plan is to continue full anticoagulation until seen by hematology for a second opinion.  CKD stage IV Close to baseline Follow electrolytes Avoid nephrotoxic agents  Normocytic anemia Baseline anemia due to CKD-likely worsened due to acute illness No evidence of blood loss Follow-and transfuse if Hb <7  HLD Statin  Hypothyroidism Synthroid  Hypertension.  Blood pressure slightly more elevated than desired, have increased hydralazine dose, continue beta-blocker, Imdur, as before.  Monitor and adjust.    DM-2 (A1C 7.0 on 3/18) with uncontrolled hyperglycemia Insulin pump being continued, continue to watch CBGs closely.  Overall better controlled-per patient-she does not do well with basal/bolus regimen.  Recent Labs    07/25/22 1627 07/25/22 2153 07/26/22 0320  GLUCAP 140* 255* 108*   Obesity: Estimated body mass index is 32.45 kg/m as calculated from the following:   Height as of this encounter: 5\' 5"  (1.651 m).   Weight as of this encounter: 88.5 kg.   Code status:   Code Status: Full Code (3/20>> wanted to revoke DNR-full code order entered)  DVT Prophylaxis:    Family Communication: husabnd bedside 07/26/22   Disposition Plan: Status is: Inpatient Remains inpatient appropriate because: Severity of illness   Planned Discharge Destination:Home health over the next several days.   Diet: Diet Order             Diet NPO time specified Except for: Other (See Comments)  Diet effective ____                   MEDICATIONS: Scheduled Meds:  amoxicillin-clavulanate  1 tablet Oral BID   ascorbic acid  500 mg Oral Daily   atorvastatin  40 mg Oral Daily   clopidogrel  75 mg Oral Daily   diphenhydrAMINE  50 mg Oral Once   Or   diphenhydrAMINE  50 mg Intravenous Once   docusate  sodium  100 mg Oral BID   doxycycline  100 mg Oral Q12H   hydrALAZINE  25 mg Oral Q8H   insulin pump   Subcutaneous TID WC, HS, 0200   isosorbide mononitrate  20 mg Oral BID   levothyroxine  125 mcg Oral Q0600   magnesium oxide  400 mg Oral Daily   metoprolol succinate  50 mg Oral Daily   multivitamin with minerals  1 tablet Oral Daily   pantoprazole  40 mg Oral Daily   predniSONE  50 mg Oral Q6H   ranolazine  500 mg Oral BID   scopolamine  1 patch Transdermal Q72H   sodium chloride flush  3 mL Intravenous Q12H   tiZANidine  4 mg Oral QHS   traZODone  50 mg Oral QHS   zinc sulfate  220 mg Oral Daily   Continuous Infusions:  sodium chloride     sodium chloride 1 mL/kg/hr (07/26/22 0657)   heparin 1,500 Units/hr (07/25/22 2205)   lactated ringers     PRN Meds:.sodium chloride, acetaminophen **OR** acetaminophen, alum & mag hydroxide-simeth, bisacodyl, hydrALAZINE, ondansetron **OR** ondansetron (ZOFRAN) IV, oxyCODONE, polyethylene glycol, prochlorperazine, sodium chloride flush   I have personally reviewed following labs and imaging studies  LABORATORY DATA:  Recent Labs  Lab 07/21/22 0201 07/22/22 0232 07/24/22 0640 07/25/22 0205 07/26/22 0335  WBC 11.2* 14.1* 10.2 9.9 11.5*  HGB 7.7* 9.3* 9.0* 8.8* 9.5*  HCT 24.0* 27.8* 28.1* 27.5*  29.9*  PLT 443* 588* 614* 574* 633*  MCV 89.2 88.0 88.4 89.0 87.7  MCH 28.6 29.4 28.3 28.5 27.9  MCHC 32.1 33.5 32.0 32.0 31.8  RDW 13.8 13.8 14.1 14.2 14.3    Recent Labs  Lab 07/19/22 0918 07/19/22 1451 07/19/22 1459 07/20/22 0127 07/22/22 0232 07/23/22 0311 07/24/22 0640 07/25/22 0205 07/26/22 0335  NA  --   --   --    < > 131* 134* 136 136 134*  K  --   --   --    < > 5.0 4.0 3.9 4.7 4.7  CL  --   --   --    < > 104 105 104 106 106  CO2  --   --   --    < > 16* 21* 21* 23 21*  ANIONGAP  --   --   --    < > 11 8 11 7 7   GLUCOSE  --   --   --    < > 463* 97 108* 78 114*  BUN  --   --   --    < > 46* 42* 35* 34* 35*   CREATININE  --   --   --    < > 2.74* 2.65* 2.41* 2.30* 2.33*  CRP  --  6.1*  --   --   --   --  1.9* 0.9 0.5  PROCALCITON  --   --   --   --   --   --  0.57 0.55 0.47  LATICACIDVEN 0.7  --   --   --   --   --   --   --   --   INR  --   --   --    < > 1.2 1.8* 1.8* 1.6* 1.2  HGBA1C  --   --  7.0*  --   --   --   --   --   --   BNP  --   --   --   --   --   --  2,038.3* 1,223.4* 1,226.3*  MG  --   --   --   --   --   --  2.4 2.4 2.5*  CALCIUM  --   --   --    < > 8.0* 8.3* 8.3* 8.1* 8.3*   < > = values in this interval not displayed.   RADIOLOGY STUDIES/RESULTS: ECHOCARDIOGRAM COMPLETE  Result Date: 07/24/2022    ECHOCARDIOGRAM REPORT   Patient Name:   Lauren [name] Date of Exam: 07/24/2022 Medical Rec #:  [id number]       Height:       65.0 in Accession #:    [id number]      Weight:       195.0 lb Date of Birth:  Jun 26, 1980       BSA:          1.957 m Patient Age:    42 years        BP:           144/79 mmHg Patient Gender: F               HR:           97 bpm. Exam Location:  Inpatient Procedure: 2D Echo, Cardiac Doppler, Color Doppler, 3D Echo and Intracardiac            Opacification Agent Indications:    Chest pain R07.9  History:        Patient has prior history of Echocardiogram examinations, most                 recent 01/14/2022. Carotid Disease, PAD, CKD4 and Stroke; Risk                 Factors:Hypertension, Dyslipidemia and Former Smoker.  Sonographer:    Wilkie Aye RVT RCS Referring Phys: [place]  Sonographer Comments: Technically challenging study due to limited acoustic windows, Technically difficult study due to poor echo windows, suboptimal apical window and suboptimal parasternal window. IMPRESSIONS  1. Left ventricular ejection fraction, by estimation, is 30 to 35%. The left ventricle has moderately decreased function. The left ventricle demonstrates regional wall motion abnormalities (see scoring diagram/findings for description). There is moderate left ventricular  hypertrophy. Left ventricular diastolic parameters are indeterminate.  2. Right ventricular systolic function is mildly reduced. The right ventricular size is normal. There is mildly elevated pulmonary artery systolic pressure. The estimated right ventricular systolic pressure is Q000111Q mmHg.  3. Left atrial size was moderately dilated.  4. The mitral valve is grossly normal. Mild to moderate mitral valve regurgitation.  5. The aortic valve was not well visualized. Aortic valve regurgitation is not visualized. Aortic valve mean gradient measures 2.0 mmHg.  6. The inferior vena cava is normal in size with greater than 50% respiratory variability, suggesting right atrial pressure of 3 mmHg. Comparison(s): Prior images reviewed side by side. LVEF has decreased in comparison, 30-35% range with wall motion abnormalities. Mitral regurgitation mild to moderate. FINDINGS  Left Ventricle: Left ventricular ejection fraction, by estimation, is 30 to 35%. The left ventricle has moderately decreased function. The left ventricle demonstrates regional wall motion abnormalities. Definity contrast agent was given IV to delineate the left ventricular endocardial borders. The left ventricular internal cavity size was normal in size. There is moderate left ventricular hypertrophy. Left ventricular diastolic parameters are indeterminate.  LV Wall Scoring: The mid and distal anterior septum, apical lateral segment, apical inferior segment, and apex are akinetic. The inferior wall, basal anteroseptal segment, mid inferolateral segment, mid inferoseptal segment, apical anterior segment, and basal inferoseptal segment are hypokinetic. The anterior wall, antero-lateral wall, and basal inferolateral segment are normal. Right Ventricle: The right ventricular size is normal. No increase in right ventricular wall thickness. Right ventricular systolic function is mildly reduced. There is mildly elevated pulmonary artery systolic pressure. The  tricuspid regurgitant velocity  is 2.92 m/s, and with an assumed right atrial pressure of 3 mmHg, the estimated right ventricular systolic pressure is Q000111Q mmHg. Left Atrium: Left atrial size was moderately dilated. Right Atrium: Right atrial size was normal in size. Pericardium: There is no evidence of pericardial effusion. Mitral Valve: The mitral valve is grossly normal. Mild to moderate mitral valve regurgitation. Tricuspid Valve: The tricuspid valve is grossly normal. Tricuspid valve regurgitation is trivial. Aortic Valve: The aortic valve was not well visualized. Aortic valve regurgitation is not visualized. Aortic valve mean gradient measures 2.0 mmHg. Aortic valve peak gradient measures 2.9 mmHg. Aortic valve area, by VTI measures 2.27 cm. Pulmonic Valve: The pulmonic valve was not well visualized. Pulmonic valve regurgitation is trivial. Aorta: The aortic root is normal in size and structure. Venous: The inferior vena cava is normal in size with greater than 50% respiratory variability, suggesting right atrial pressure of 3 mmHg. IAS/Shunts: No atrial level shunt detected by color flow Doppler.  LEFT VENTRICLE PLAX 2D LVIDd:  5.20 cm      Diastology LVIDs:         4.20 cm      LV e' medial:   5.63 cm/s LV PW:         1.80 cm      LV E/e' medial: 17.7 LV IVS:        1.10 cm LVOT diam:     1.70 cm LV SV:         35 LV SV Index:   18 LVOT Area:     2.27 cm  LV Volumes (MOD) LV vol d, MOD A2C: 202.0 ml LV vol s, MOD A2C: 133.0 ml LV SV MOD A2C:     69.0 ml RIGHT VENTRICLE             IVC RV Basal diam:  3.60 cm     IVC diam: 2.10 cm RV S prime:     10.50 cm/s TAPSE (M-mode): 1.5 cm LEFT ATRIUM             Index        RIGHT ATRIUM           Index LA diam:        4.10 cm 2.10 cm/m   RA Area:     13.00 cm LA Vol (A2C):   65.3 ml 33.37 ml/m  RA Volume:   27.30 ml  13.95 ml/m LA Vol (A4C):   88.8 ml 45.38 ml/m LA Biplane Vol: 76.4 ml 39.04 ml/m  AORTIC VALVE                    PULMONIC VALVE AV Area  (Vmax):    2.49 cm     PV Vmax:       0.71 m/s AV Area (Vmean):   2.43 cm     PV Peak grad:  2.0 mmHg AV Area (VTI):     2.27 cm AV Vmax:           84.70 cm/s AV Vmean:          56.300 cm/s AV VTI:            0.156 m AV Peak Grad:      2.9 mmHg AV Mean Grad:      2.0 mmHg LVOT Vmax:         93.00 cm/s LVOT Vmean:        60.300 cm/s LVOT VTI:          0.156 m LVOT/AV VTI ratio: 1.00  AORTA Ao Root diam: 2.80 cm Ao Asc diam:  3.10 cm Ao Arch diam: 2.9 cm MITRAL VALVE               TRICUSPID VALVE MV Area (PHT): 6.22 cm    TR Peak grad:   34.1 mmHg MV Decel Time: 122 msec    TR Vmax:        292.00 cm/s MV E velocity: 99.70 cm/s MV A velocity: 47.50 cm/s  SHUNTS MV E/A ratio:  2.10        Systemic VTI:  0.16 m                            Systemic Diam: 1.70 cm Rozann Lesches MD Electronically signed by Rozann Lesches MD Signature Date/Time: 07/24/2022/5:07:35 PM    Final      LOS: 7 days   Signature  -    Virgel Gess.D  on 07/26/2022 at 7:45 AM   -  To page go to [email or web address]

## 2022-07-27 ENCOUNTER — Ambulatory Visit (HOSPITAL_COMMUNITY): Payer: BC Managed Care – PPO

## 2022-07-27 ENCOUNTER — Other Ambulatory Visit (HOSPITAL_COMMUNITY): Payer: Self-pay

## 2022-07-27 ENCOUNTER — Telehealth: Payer: Self-pay | Admitting: Physician Assistant

## 2022-07-27 DIAGNOSIS — E11628 Type 2 diabetes mellitus with other skin complications: Secondary | ICD-10-CM | POA: Diagnosis not present

## 2022-07-27 DIAGNOSIS — L089 Local infection of the skin and subcutaneous tissue, unspecified: Secondary | ICD-10-CM | POA: Diagnosis not present

## 2022-07-27 LAB — BASIC METABOLIC PANEL
Anion gap: 9 (ref 5–15)
BUN: 46 mg/dL — ABNORMAL HIGH (ref 6–20)
CO2: 18 mmol/L — ABNORMAL LOW (ref 22–32)
Calcium: 8.2 mg/dL — ABNORMAL LOW (ref 8.9–10.3)
Chloride: 103 mmol/L (ref 98–111)
Creatinine, Ser: 2.64 mg/dL — ABNORMAL HIGH (ref 0.44–1.00)
GFR, Estimated: 23 mL/min — ABNORMAL LOW (ref >60)
Glucose, Bld: 336 mg/dL — ABNORMAL HIGH (ref 70–99)
Potassium: 5 mmol/L (ref 3.5–5.1)
Sodium: 130 mmol/L — ABNORMAL LOW (ref 135–145)

## 2022-07-27 LAB — CBC
HCT: 28 % — ABNORMAL LOW (ref 36.0–46.0)
Hemoglobin: 8.8 g/dL — ABNORMAL LOW (ref 12.0–15.0)
MCH: 28 pg (ref 26.0–34.0)
MCHC: 31.4 g/dL (ref 30.0–36.0)
MCV: 89.2 fL (ref 80.0–100.0)
Platelets: 619 10*3/uL — ABNORMAL HIGH (ref 150–400)
RBC: 3.14 MIL/uL — ABNORMAL LOW (ref 3.87–5.11)
RDW: 14.3 % (ref 11.5–15.5)
WBC: 17.8 10*3/uL — ABNORMAL HIGH (ref 4.0–10.5)
nRBC: 0 % (ref 0.0–0.2)

## 2022-07-27 LAB — PROCALCITONIN: Procalcitonin: 0.46 ng/mL

## 2022-07-27 LAB — C-REACTIVE PROTEIN: CRP: 0.5 mg/dL (ref <1.0)

## 2022-07-27 LAB — PROTIME-INR
INR: 1.2 (ref 0.8–1.2)
Prothrombin Time: 14.8 seconds (ref 11.4–15.2)

## 2022-07-27 LAB — GLUCOSE, CAPILLARY
Glucose-Capillary: 355 mg/dL — ABNORMAL HIGH (ref 70–99)
Glucose-Capillary: 269 mg/dL — ABNORMAL HIGH (ref 70–99)

## 2022-07-27 LAB — BRAIN NATRIURETIC PEPTIDE: B Natriuretic Peptide: 2632.3 pg/mL — ABNORMAL HIGH (ref 0.0–100.0)

## 2022-07-27 LAB — MAGNESIUM: Magnesium: 2.7 mg/dL — ABNORMAL HIGH (ref 1.7–2.4)

## 2022-07-27 MED ORDER — AMOXICILLIN-POT CLAVULANATE 500-125 MG PO TABS
1.0000 | ORAL_TABLET | Freq: Two times a day (BID) | ORAL | 0 refills | Status: AC
  Filled 2022-07-27: qty 50, 25d supply, fill #0

## 2022-07-27 MED ORDER — ENOXAPARIN SODIUM 100 MG/ML IJ SOSY
90.0000 mg | PREFILLED_SYRINGE | Freq: Two times a day (BID) | INTRAMUSCULAR | Status: DC
  Administered 2022-07-27: 90 mg via SUBCUTANEOUS
  Filled 2022-07-27: qty 1

## 2022-07-27 MED ORDER — ENOXAPARIN SODIUM 100 MG/ML IJ SOSY
PREFILLED_SYRINGE | INTRAMUSCULAR | 0 refills | Status: DC
  Filled 2022-07-27: qty 28, 14d supply, fill #0

## 2022-07-27 MED ORDER — INSULIN ASPART 100 UNIT/ML IJ SOLN: 0.0000 [IU] | INTRAMUSCULAR | Status: DC

## 2022-07-27 MED ORDER — DOXYCYCLINE HYCLATE 100 MG PO TABS
100.0000 mg | ORAL_TABLET | Freq: Two times a day (BID) | ORAL | 0 refills | Status: AC
  Filled 2022-07-27: qty 50, 25d supply, fill #0

## 2022-07-27 NOTE — Discharge Summary (Signed)
Lauren [name] [id number] DOB: [birth year] DOA: 07/19/2022  PCP: Lauren Abraham, MD  Admit date: 07/19/2022  Discharge date: 07/27/2022  Admitted From: Home   Disposition:  Home   Recommendations for Outpatient Follow-up:   Follow up with PCP in 1-2 weeks  PCP Please obtain BMP/CBC, 2 view CXR in 1week,  (see Discharge instructions)   PCP Please follow up on the following pending results: Monitor INR, CBC, BMP closely, discontinue Lovenox once INR reaches 2.   Home Health: None   Equipment/Devices: walker Consultations: Podiatry, cardiology, VVS, ID Discharge Condition: Stable    CODE STATUS: Full    Diet Recommendation: Heart Healthy Low Carb    Chief Complaint  Patient presents with   Chest Pain     Brief history of present illness from the day of admission and additional interim summary    42 y.o.  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    Significant events: 3/18>> admit to [facility] 3/20>> left fifth metatarsal excision by podiatry 3/21>> back on insulin pump, chest pain with elevated troponins.   Significant studies: 3/18>> MRI left foot: Acute osteomyelitis of residual fifth metatarsal 3/18>> CXR: No PNA   Significant microbiology data: 3/18>> blood culture: No growth 3/20>> left fourth metatarsal: No growth   Procedures: 3/20>> left fifth metatarsal excision, bone biopsy of fourth metatarsal.                                                                   Hospital Course   Left diabetic foot  with Acute osteomyelitis of fifth metatarsal-s/p excision of left fifth metatarsal  and bone biopsy of fourth metatarsal on 3/20 Left fourth metatarsal culture negative so far Initially on IV antibiotics-evaluated by ID and switched to Doxy/Augmentin for a total of 4 weeks.  Post discharge will follow-up with ID, PCP and podiatry. nonweightbearing to left lower extremity, has protective boot whenever out of the bed.     Chest pain-elevated troponins-non-STEMI versus demand ischemia Known CAD-last LHC 2015-cardiology f saw the patient and she went left heart catheterization on 07/26/2022, per cardiology she has CAD but not amenable to any intervention, continue full medical treatment.  Already on Plavix/statin/beta-blocker/Ranexa currently chest pain-free eager to go home will be discharged with outpatient follow-up with PCP and her primary cardiologist.   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 Suspicion that both of these events were embolic-hence maintained on Coumadin prior to this hospitalization Coumadin reversed with vitamin K-postoperatively-restarted on Coumadin /Lovenox bridge. PCP to monitor INR and discontinue Lovenox once INR reaches goal,. Note-extensive prior workup including hypercoagulable workup negative Reviewed outpatient neurology note-no documentation of  A-fib-Per neurology-no anticoagulation from their point of view, however given to embolic events-hematology has maintained patient on anticoagulation.  This MD spoke with primary hematologist Dr Chryl Heck on 3/19-she has arranged for a second opinion at [facility] hematology.  Plan is to continue full anticoagulation until seen by hematology for a second opinion.   CKD stage IV Close to baseline Creatinine of 2.6, PCP to monitor.   Normocytic anemia Baseline anemia due to CKD-likely worsened due to acute illness No evidence of blood loss Repeat to repeat CBC next visit    HLD Statin   Hypothyroidism Synthroid, continue PCP to monitor   Hypertension.  Blood pressure continue home medication regimen as before.   DM-2 (A1C 7.0 on 3/18) with uncontrolled hyperglycemia Insulin pump being continued, continue to watch CBGs closely.  Overall better controlled-per patient-she does want any additional insulin coverage in the hospital.   Obesity: Estimated body mass index is 32.45 follow-up with PCP for weight loss. 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[name])   Primary hypertension   Hypothyroidism   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)   CAD (coronary artery disease)   Class 2 obesity   History of CVA (cerebrovascular accident)   HFrEF (heart failure with reduced ejection fraction) ([place])   PVD (peripheral vascular disease) ([place])   DNR (do not resuscitate)   Osteomyelitis [facility])    Discharge instructions    Discharge Instructions     Discharge instructions   Complete by: As directed    Follow with Primary MD Lauren Abraham, MD in 3 days   Get CBC, CMP, INR, Magnesium -  checked next visit with your primary MD    Activity: As tolerated with Full fall precautions use walker/cane & assistance as needed  Disposition Home    Diet: Heart Healthy low  carbohydrate diet, check CBGs q. ACH S maintain a logbook and show it to PCP in a week.  Special Instructions: If you have smoked or chewed Tobacco  in the last 2 yrs please stop smoking, stop any regular Alcohol  and or any Recreational drug use.  On your next visit with your primary care physician please Get Medicines reviewed and adjusted.  Please request your Prim.MD to go over all Hospital Tests and Procedure/Radiological results at the follow up, please get all Hospital records sent to your Prim MD by signing hospital release before you go home.  If you experience worsening of your admission symptoms, develop shortness of breath, life threatening emergency, suicidal or homicidal thoughts you must seek medical attention immediately by calling 911 or calling your MD immediately  if symptoms less severe.  You Must read complete instructions/literature along with all the possible adverse reactions/side effects for all the Medicines you take and that have been prescribed to you. Take any new Medicines  after you have completely understood and accpet all the possible adverse reactions/side effects.   Discharge wound care:   Complete by: As directed    Keep your foot clean and dry at all times, wear the protective boot whenever out of the bed   Increase activity slowly   Complete by: As directed        Discharge Medications   Allergies as of 07/27/2022       Reactions   Visipaque [iodixanol] Nausea And Vomiting   Patient had vagal response during procedure became hypertensive, and bradycardic. CO2 used during procedure prior to contrast being used, this may be cause as well. Recommended premedicating prior to contrast being used in the future.    Wellbutrin [bupropion] Hives   Cefepime Rash   Tolerates penicilllin   Ciprofloxacin Hcl Hives, Rash   Hives/rash at injection site; 01/15/22 tolerated IV cipro   Tape Rash        Medication List     STOP taking these medications     amoxicillin-clavulanate 875-125 MG tablet Commonly known as: AUGMENTIN Replaced by: amoxicillin-clavulanate 500-125 MG tablet   enoxaparin 150 MG/ML injection Commonly known as: LOVENOX Replaced by: enoxaparin 100 MG/ML injection   ibuprofen 200 MG tablet Commonly known as: ADVIL   methocarbamol 500 MG tablet Commonly known as: ROBAXIN   ramelteon 8 MG tablet Commonly known as: ROZEREM   Vitamin D (Ergocalciferol) 1.25 MG (50000 UNIT) Caps capsule Commonly known as: DRISDOL       TAKE these medications    acetaminophen 325 MG tablet Commonly known as: TYLENOL Take 1-2 tablets (325-650 mg total) by mouth every 4 (four) hours as needed for mild pain. What changed:  how much to take when to take this   amoxicillin-clavulanate 500-125 MG tablet Commonly known as: AUGMENTIN Take 1 tablet by mouth 2 (two) times daily for 25 days. Replaces: amoxicillin-clavulanate 875-125 MG tablet   atorvastatin 40 MG tablet Commonly known as: LIPITOR Take 1 tablet (40 mg total) by mouth daily. Courtesy fill/ pt to get established with a primary MD for further refills   clopidogrel 75 MG tablet Commonly known as: PLAVIX Take 1 tablet (75 mg total) by mouth daily.   Dexcom G6 Sensor Misc Change sensor every 10 days as directed   diclofenac Sodium 1 % Gel Commonly known as: Voltaren Arthritis Pain Apply 2 g topically 4 (four) times daily. What changed:  how much to take when to take this reasons to take this   doxycycline 100 MG tablet Commonly known as: VIBRA-TABS Take 1 tablet (100 mg total) by mouth every 12 (twelve) hours for 25 days.   enoxaparin 100 MG/ML injection Commonly known as: LOVENOX 90 mg twice daily, 2-week supply.  Stop once INR reaches 2.  Follow-up with your PCP within 2 to 3 days for INR check. Replaces: enoxaparin 150 MG/ML injection   furosemide 40 MG tablet Commonly known as: LASIX Take 40 mg daily as needed for swelling   hydrALAZINE 25 MG  tablet Commonly known as: APRESOLINE Take 0.5 tablets (12.5 mg total) by mouth 2 (two) times daily.   insulin aspart 100 UNIT/ML injection Commonly known as: novoLOG Use with Omnipod for daily dose around 80 units daily   isosorbide mononitrate 20 MG tablet Commonly known as: ISMO Take 1 tablet (20 mg total) by mouth 2 (two) times daily. Courtesy fill/ pt to get established with a primary MD for further refills   levothyroxine 125 MCG tablet Commonly known as: SYNTHROID  Take 1 tablet (125 mcg total) by mouth daily at 6 (six) AM.   magnesium oxide 400 MG tablet Commonly known as: MAG-OX Take 0.5 tablets (200 mg total) by mouth at bedtime. What changed:  how much to take when to take this   metoprolol succinate 50 MG 24 hr tablet Commonly known as: Toprol XL Take 1 tablet (50 mg total) by mouth daily.   multivitamin with minerals Tabs tablet Take 1 tablet by mouth daily.   nitroGLYCERIN 0.4 MG SL tablet Commonly known as: NITROSTAT Place 1 tablet (0.4 mg total) under the tongue every 5 (five) minutes x 3 doses as needed for chest pain.   Omnipod 5 G6 Pods (Gen 5) Misc Change pod every 48-72 hours   Omnipod 5 G6 Pods (Gen 5) Misc Change pod every 48-72 hours   ondansetron 4 MG tablet Commonly known as: Zofran Take 1 tablet (4 mg total) by mouth every 8 (eight) hours as needed for nausea or vomiting.   oxyCODONE 5 MG immediate release tablet Commonly known as: Oxy IR/ROXICODONE Take 1 tablet (5 mg total) by mouth every 6 (six) hours as needed for severe pain.   pantoprazole 40 MG tablet Commonly known as: PROTONIX Take 1 tablet (40 mg total) by mouth daily. Courtesy fill/ pt to get established with a primary MD for further refills What changed:  when to take this reasons to take this   polyethylene glycol 17 g packet Commonly known as: MIRALAX / GLYCOLAX Take 17 g by mouth 2 (two) times daily. What changed:  when to take this reasons to take this   ranolazine  500 MG 12 hr tablet Commonly known as: RANEXA Take 1 tablet (500 mg total) by mouth 2 (two) times daily. Courtesy fill/ pt to get established with a primary MD for further refills   Santyl 250 UNIT/GM ointment Generic drug: collagenase Apply 1 Application topically daily.   tiZANidine 4 MG tablet Commonly known as: ZANAFLEX Take 1 tablet (4 mg total) by mouth at bedtime. Courtesy fill/ pt to get established with a primary MD for further refills   traZODone 50 MG tablet Commonly known as: DESYREL Take 1 tablet (50 mg total) by mouth at bedtime.   warfarin 5 MG tablet Commonly known as: COUMADIN Take as directed. If you are unsure how to take this medication, talk to your nurse or doctor. Original instructions: TAKE ONE TABLET BY MOUTH DAILY EXCEPT 1 AND 1/2 TABLET ON WEDNESDAYS AND SATURDAYS OR AS DIRECTED What changed:  how much to take how to take this when to take this               Discharge Care Instructions  (From admission, onward)           Start     Ordered   07/27/22 0000  Discharge wound care:       Comments: Keep your foot clean and dry at all times, wear the protective boot whenever out of the bed   07/27/22 0808             Follow-up Information     [facility] at [facility] Follow up in 6 week(s).   Specialty: Vascular Surgery Contact information: [address] [place] [phone]        Lauren Abraham, MD. Schedule an appointment as soon as possible for a visit in 1 week(s).   Specialty: Internal Medicine Contact information: [address]  [place] [address] [phone]         Jabier Mutton, MD. Schedule an appointment as soon as possible for a visit in 1 week(s).   Specialty: Infectious Diseases Contact information: [address] [place] [address] [phone]         Trula Slade, DPM. Schedule an appointment as soon as  possible for a visit in 1 week(s).   Specialty: Podiatry Contact information: [address] [place] [address] [phone]                 Major procedures and Radiology Reports - PLEASE review detailed and final reports thoroughly  -       CARDIAC CATHETERIZATION  Result Date: 07/26/2022   Ost LAD to Prox LAD lesion is 75% stenosed.   Prox LAD to Mid LAD lesion is 100% stenosed.   Prox Cx to Dist Cx lesion is 35% stenosed with 35% stenosed side branch in LPAV.   Prox RCA lesion is 95% stenosed.   Mid RCA lesion is 95% stenosed.   Dist RCA lesion is 100% stenosed.   LV end diastolic pressure is mildly elevated. 2 vessel occlusive CAD. The LAD is occluded after a small diagonal. The RCA is small and diffusely diseased. It is occluded distally Mildly elevated LVEDP Plan: medical management. No suitable targets for revascularization.   ECHOCARDIOGRAM COMPLETE  Result Date: 07/24/2022    ECHOCARDIOGRAM REPORT   Patient Name:   CLARENE ABRAMOWSKI Date of Exam: 07/24/2022 Medical Rec #:  [id number]       Height:       65.0 in Accession #:    [id number]      Weight:       195.0 lb Date of Birth:  1981/02/24       BSA:          1.957 m Patient Age:    17 years        BP:           144/79 mmHg Patient Gender: F               HR:           97 bpm. Exam Location:  Inpatient Procedure: 2D Echo, Cardiac Doppler, Color Doppler, 3D Echo and Intracardiac            Opacification Agent Indications:    Chest pain R07.9  History:        Patient has prior history of Echocardiogram examinations, most                 recent 01/14/2022. Carotid Disease, PAD, CKD4 and Stroke; Risk                 Factors:Hypertension, Dyslipidemia and Former Smoker.  Sonographer:    Wilkie Aye RVT RCS Referring Phys: [place]  Sonographer Comments: Technically challenging study due to limited acoustic windows, Technically difficult study due to poor echo windows, suboptimal apical window and suboptimal  parasternal window. IMPRESSIONS  1. Left ventricular ejection fraction, by estimation, is 30 to 35%. The left ventricle has moderately decreased function. The left ventricle demonstrates regional wall motion abnormalities (see scoring diagram/findings for description). There is moderate left ventricular hypertrophy. Left ventricular diastolic parameters are indeterminate.  2. Right ventricular systolic function is mildly reduced. The right ventricular size is normal. There is mildly elevated pulmonary artery systolic pressure. The estimated right ventricular systolic pressure is Q000111Q mmHg.  3. Left atrial size was moderately dilated.  4. The mitral valve is grossly normal. Mild to moderate mitral valve regurgitation.  5. The aortic valve was not well visualized. Aortic valve regurgitation is not visualized. Aortic valve mean gradient measures 2.0 mmHg.  6. The inferior vena cava is normal in size with greater than 50% respiratory variability, suggesting right atrial pressure of 3 mmHg. Comparison(s): Prior images reviewed side by side. LVEF has decreased in comparison, 30-35% range with wall motion abnormalities. Mitral regurgitation mild to moderate. FINDINGS  Left Ventricle: Left ventricular ejection fraction, by estimation, is 30 to 35%. The left ventricle has moderately decreased function. The left ventricle demonstrates regional wall motion abnormalities. Definity contrast agent was given IV to delineate the left ventricular endocardial borders. The left ventricular internal cavity size was normal in size. There is moderate left ventricular hypertrophy. Left ventricular diastolic parameters are indeterminate.  LV Wall Scoring: The mid and distal anterior septum, apical lateral segment, apical inferior segment, and apex are akinetic. The inferior wall, basal anteroseptal segment, mid inferolateral segment, mid inferoseptal segment, apical anterior segment, and basal inferoseptal segment are hypokinetic. The  anterior wall, antero-lateral wall, and basal inferolateral segment are normal. Right Ventricle: The right ventricular size is normal. No increase in right ventricular wall thickness. Right ventricular systolic function is mildly reduced. There is mildly elevated pulmonary artery systolic pressure. The tricuspid regurgitant velocity  is 2.92 m/s, and with an assumed right atrial pressure of 3 mmHg, the estimated right ventricular systolic pressure is Q000111Q mmHg. Left Atrium: Left atrial size was moderately dilated. Right Atrium: Right atrial size was normal in size. Pericardium: There is no evidence of pericardial effusion. Mitral Valve: The mitral valve is grossly normal. Mild to moderate mitral valve regurgitation. Tricuspid Valve: The tricuspid valve is grossly normal. Tricuspid valve regurgitation is trivial. Aortic Valve: The aortic valve was not well visualized. Aortic valve regurgitation is not visualized. Aortic valve mean gradient measures 2.0 mmHg. Aortic valve peak gradient measures 2.9 mmHg. Aortic valve area, by VTI measures 2.27 cm. Pulmonic Valve: The pulmonic valve was not well visualized. Pulmonic valve regurgitation is trivial. Aorta: The aortic root is normal in size and structure. Venous: The inferior vena cava is normal in size with greater than 50% respiratory variability, suggesting right atrial pressure of 3 mmHg. IAS/Shunts: No atrial level shunt detected by color flow Doppler.  LEFT VENTRICLE PLAX 2D LVIDd:         5.20 cm      Diastology LVIDs:         4.20 cm      LV e' medial:   5.63 cm/s LV PW:         1.80 cm      LV E/e' medial: 17.7 LV IVS:        1.10 cm LVOT diam:     1.70 cm LV SV:         35 LV SV Index:   18 LVOT Area:     2.27 cm  LV Volumes (MOD) LV vol d, MOD A2C: 202.0 ml LV vol s, MOD A2C: 133.0 ml LV SV MOD A2C:     69.0 ml RIGHT VENTRICLE             IVC RV Basal diam:  3.60 cm     IVC diam: 2.10 cm RV S prime:     10.50 cm/s TAPSE (M-mode): 1.5 cm LEFT ATRIUM  Index        RIGHT ATRIUM           Index LA diam:        4.10 cm 2.10 cm/m   RA Area:     13.00 cm LA Vol (A2C):   65.3 ml 33.37 ml/m  RA Volume:   27.30 ml  13.95 ml/m LA Vol (A4C):   88.8 ml 45.38 ml/m LA Biplane Vol: 76.4 ml 39.04 ml/m  AORTIC VALVE                    PULMONIC VALVE AV Area (Vmax):    2.49 cm     PV Vmax:       0.71 m/s AV Area (Vmean):   2.43 cm     PV Peak grad:  2.0 mmHg AV Area (VTI):     2.27 cm AV Vmax:           84.70 cm/s AV Vmean:          56.300 cm/s AV VTI:            0.156 m AV Peak Grad:      2.9 mmHg AV Mean Grad:      2.0 mmHg LVOT Vmax:         93.00 cm/s LVOT Vmean:        60.300 cm/s LVOT VTI:          0.156 m LVOT/AV VTI ratio: 1.00  AORTA Ao Root diam: 2.80 cm Ao Asc diam:  3.10 cm Ao Arch diam: 2.9 cm MITRAL VALVE               TRICUSPID VALVE MV Area (PHT): 6.22 cm    TR Peak grad:   34.1 mmHg MV Decel Time: 122 msec    TR Vmax:        292.00 cm/s MV E velocity: 99.70 cm/s MV A velocity: 47.50 cm/s  SHUNTS MV E/A ratio:  2.10        Systemic VTI:  0.16 m                            Systemic Diam: 1.70 cm Rozann Lesches MD Electronically signed by Rozann Lesches MD Signature Date/Time: 07/24/2022/5:07:35 PM    Final    DG Foot Complete Left  Result Date: 07/21/2022 CLINICAL DATA:  Osteomyelitis.  Postoperative evaluation. EXAM: LEFT FOOT - COMPLETE 3+ VIEW COMPARISON:  Left foot radiographs 07/19/2022 and 01/30/2022 FINDINGS: Interval amputation of the remaining prior 50% of the fifth metatarsal. Expected postoperative subcutaneous air. Subacute partially healed fracture of the distal shaft of the fourth metatarsal with partial healed sclerosis and moderate peripheral callus formation. On lateral view there appears to be approximately 6 mm dorsal displacement of the distal fracture component with respect to the proximal fracture component (slightly greater than 1 bone width). IMPRESSION: 1. Interval amputation of the remaining prior 50% of the fifth  metatarsal. No unexpected findings. 2. Subacute partially healed and mildly displaced fracture of the distal shaft of the fourth metatarsal. Electronically Signed   By: Yvonne Kendall M.D.   On: 07/21/2022 17:35   VAS [name] LOWER EXTREMITY ARTERIAL DUPLEX  Result Date: 07/20/2022 LOWER EXTREMITY ARTERIAL DUPLEX STUDY Patient Name:  JALI COCKFIELD  Date of Exam:   07/20/2022 Medical Rec #: [id number]        Accession #:    [id number] Date of Birth: Oct 28, 1980  Patient Gender: F Patient Age:   21 years Exam Location:  [facility] Procedure:      VAS [name] LOWER EXTREMITY ARTERIAL DUPLEX Referring Phys: [facility] --------------------------------------------------------------------------------  Indications: Peripheral artery disease, and Diabetic left foot ulcer. High Risk Factors: Diabetes, current smoker, coronary artery disease, prior CVA.  Vascular Interventions: 06/10/22 - Left anterior tibial and dorsalis pedis                         angioplasty with angioplasty and stenting of left mid                         SFA by Dr. Carlis Abbott. Current ABI:            Right 0.93, Left 0.88 Performing Technologist: Velva Harman Sturdivant RDMS, RVT  Examination Guidelines: A complete evaluation includes B-mode imaging, spectral Doppler, color Doppler, and power Doppler as needed of all accessible portions of each vessel. Bilateral testing is considered an integral part of a complete examination. Limited examinations for reoccurring indications may be performed as noted.   Right Stent(s): +---------------+---++----------++ Prox to Stent  37monophasic +---------------+---++----------++ Proximal Stent 36monophasic +---------------+---++----------++ Mid Stent      51monophasic +---------------+---++----------++ Distal Stent   71monophasic +---------------+---++----------++ Distal to Stent18monophasic +---------------+---++----------++     +-----------+--------+-----+---------------+----------+------------------------+ LEFT       PSV cm/sRatioStenosis       Waveform  Comments                 +-----------+--------+-----+---------------+----------+------------------------+ CFA Mid    87                          monophasic                         +-----------+--------+-----+---------------+----------+------------------------+ CFA Distal 109                         monophasic                         +-----------+--------+-----+---------------+----------+------------------------+ DFA        90                          monophasic                         +-----------+--------+-----+---------------+----------+------------------------+ SFA Prox   135                                                            +-----------+--------+-----+---------------+----------+------------------------+ SFA Mid                                          Mid SFA Stent            +-----------+--------+-----+---------------+----------+------------------------+ SFA Distal 116                         monophasic                         +-----------+--------+-----+---------------+----------+------------------------+  POP Prox   151                         monophasic                         +-----------+--------+-----+---------------+----------+------------------------+ POP Distal 94                          monophasic                         +-----------+--------+-----+---------------+----------+------------------------+ TP Trunk   106                         monophasic                         +-----------+--------+-----+---------------+----------+------------------------+ ATA Prox   57                          monophasic                         +-----------+--------+-----+---------------+----------+------------------------+ ATA Mid    93                          monophasic                          +-----------+--------+-----+---------------+----------+------------------------+ ATA Distal 329          50-74% stenosismonophasic3.5 ratio                +-----------+--------+-----+---------------+----------+------------------------+ PTA Prox   27                          monophasicareas of occlusive                                                        disease with                                                              collaterization          +-----------+--------+-----+---------------+----------+------------------------+ PTA Mid    53                          monophasic                         +-----------+--------+-----+---------------+----------+------------------------+ PTA Distal 58                          monophasic                         +-----------+--------+-----+---------------+----------+------------------------+ PERO Prox  70  monophasic                         +-----------+--------+-----+---------------+----------+------------------------+ PERO Mid   64                          monophasic                         +-----------+--------+-----+---------------+----------+------------------------+ PERO Distal43                          monophasic                         +-----------+--------+-----+---------------+----------+------------------------+ DP         104                                                            +-----------+--------+-----+---------------+----------+------------------------+  Summary: Left: 50-74% stenosis noted in the anterior tibial artery. Patent left mid SFA stent.  See table(s) above for measurements and observations. Electronically signed by Deitra Mayo MD on 07/20/2022 at 4:41:09 PM.    Final    VAS [name] ABI WITH/WO TBI  Result Date: 07/20/2022  LOWER EXTREMITY DOPPLER STUDY Patient Name:  RILLEY VECELLIO  Date of Exam:   07/20/2022 Medical Rec #: [id number]         Accession #:    [id number] Date of Birth: 1981/03/22        Patient Gender: F Patient Age:   89 years Exam Location:  [facility] Procedure:      VAS [name] ABI WITH/WO TBI Referring Phys: Anderson Malta YATES --------------------------------------------------------------------------------  Indications: Peripheral artery disease. Left diabetic foot ulcer. High Risk Factors: Hyperlipidemia, Diabetes, current smoker, coronary artery                    disease.  Vascular Interventions: 06/10/22 - Left anterior tibial and dorsalis pedis                         angioplasty with angioplasty and stenting of left mid                         SFA by Dr. Carlis Abbott. Performing Technologist: Oda Cogan RDMS, RVT  Examination Guidelines: A complete evaluation includes at minimum, Doppler waveform signals and systolic blood pressure reading at the level of bilateral brachial, anterior tibial, and posterior tibial arteries, when vessel segments are accessible. Bilateral testing is considered an integral part of a complete examination. Photoelectric Plethysmograph (PPG) waveforms and toe systolic pressure readings are included as required and additional duplex testing as needed. Limited examinations for reoccurring indications may be performed as noted.  ABI Findings: +---------+------------------+-----+---------+----------------+ Right    Rt Pressure (mmHg)IndexWaveform Comment          +---------+------------------+-----+---------+----------------+ Brachial 131                    triphasic                 +---------+------------------+-----+---------+----------------+ ATA      255  1.95 biphasic Non compressible +---------+------------------+-----+---------+----------------+ PTA      123               0.94 biphasic                  +---------+------------------+-----+---------+----------------+ Great Toe88                0.67                            +---------+------------------+-----+---------+----------------+ +--------+------------------+-----+---------+----------------+ Left    Lt Pressure (mmHg)IndexWaveform Comment          +--------+------------------+-----+---------+----------------+ Brachial                       triphasic                 +--------+------------------+-----+---------+----------------+ PTA     115               0.88 biphasic                  +--------+------------------+-----+---------+----------------+ DP      213               1.63 biphasic Non compressible +--------+------------------+-----+---------+----------------+ +-------+-----------+-----------+------------+------------+ ABI/TBIToday's ABIToday's TBIPrevious ABIPrevious TBI +-------+-----------+-----------+------------+------------+ Right  0.94       0.67                                +-------+-----------+-----------+------------+------------+ Left   0.88       NA                                  +-------+-----------+-----------+------------+------------+  Left ABIs appear essentially unchanged compared to prior study on 01/14/22.  Summary: Right: Resting right ankle-brachial index indicates mild right lower extremity arterial disease. The right toe-brachial index is abnormal. Left: Resting left ankle-brachial index indicates mild left lower extremity arterial disease. *See table(s) above for measurements and observations.  Electronically signed by Deitra Mayo MD on 07/20/2022 at 4:40:44 PM.    Final    MR FOOT LEFT WO CONTRAST  Result Date: 07/19/2022 CLINICAL DATA:  Left foot wound EXAM: MRI OF THE LEFT FOOT WITHOUT CONTRAST TECHNIQUE: Multiplanar, multisequence MR imaging of the left forefoot was performed. No intravenous contrast was administered. COMPARISON:  X-ray 07/19/2022, MRI 01/02/2022 FINDINGS: Bones/Joint/Cartilage Prior transmetatarsal amputation of the fifth ray. Acute osteomyelitis of the residual fifth  metatarsal, most pronounced at the fifth metatarsal base with there is cortical erosion as well as confluent low T1 bone marrow signal. Displaced ununited fracture of the distal shaft of the fourth metatarsal without residual bone marrow edema. Minimal bone marrow edema of the fourth metatarsal base, nonspecific and may be reactive. Trace fluid in the fourth and fifth tarsometatarsal joints. Remaining osseous structures are within normal limits. No additional fractures. No dislocation. Ligaments Intact Lisfranc ligament. Remaining collateral ligaments are intact. Muscles and Tendons Denervation changes of the foot musculature.  No tenosynovitis. Soft tissues Wound or ulceration at the lateral foot adjacent to the fifth metatarsal base. Generalized soft tissue swelling and edema. No organized or drainable fluid collections. IMPRESSION: 1. Prior transmetatarsal amputation of the fifth ray. Wound or ulceration at the lateral foot adjacent to the fifth metatarsal base. 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  2. Minimal bone marrow edema of the fourth metatarsal base, nonspecific and may be reactive. Trace fluid in the fourth and fifth tarsometatarsal joints, likely reactive although developing septic arthritis not excluded. 3. Generalized soft tissue edema of the left foot. No organized or drainable fluid collections. 4. Displaced ununited fracture of the distal shaft of the fourth metatarsal without residual bone marrow edema. Electronically Signed   By: Davina Poke D.O.   On: 07/19/2022 10:33   DG Foot Complete Left  Result Date: 07/19/2022 CLINICAL DATA:  Lateral foot wound, concern for osteomyelitis, pain, previous fifth toe amputation April 2023 EXAM: LEFT FOOT - COMPLETE 3+ VIEW COMPARISON:  01/30/2022 FINDINGS: Previous left fifth toe amputation at the level of the fifth metatarsal mid shaft as before. Lateral foot soft tissue wound over the  fifth metatarsal base area. There is underlying fifth metatarsal base cortical irregularity and subcortical lucency when compared to the prior study suspicious for osteomyelitis. Healing left fourth metatarsal fracture with callus formation. Peripheral vascular calcifications noted. No joint abnormality. IMPRESSION: 1. Findings suspicious for left fifth metatarsal base osteomyelitis with overlying soft tissue wound. 2. Healing left fourth metatarsal fracture. 3. Previous left fifth toe amputation. Electronically Signed   By: Jerilynn Mages.  Shick M.D.   On: 07/19/2022 08:47   DG Chest 2 View  Result Date: 07/19/2022 CLINICAL DATA:  42 year old female with history of chest pain. EXAM: CHEST - 2 VIEW COMPARISON:  Chest x-ray 02/08/2022. FINDINGS: Lung volumes are low. No consolidative airspace disease. No pleural effusions. No pneumothorax. No pulmonary nodule or mass noted. Pulmonary vasculature and the cardiomediastinal silhouette are within normal limits. IMPRESSION: 1. Low lung volumes without radiographic evidence of acute cardiopulmonary disease. Electronically Signed   By: Vinnie Langton M.D.   On: 07/19/2022 06:04    Micro Results    Recent Results (from the past 240 hour(s))  Blood culture (routine x 2)     Status: None   Collection Time: 07/19/22  9:14 AM   Specimen: BLOOD RIGHT FOREARM  Result Value Ref Range Status   Specimen Description BLOOD RIGHT FOREARM  Final   Special Requests   Final    BOTTLES DRAWN AEROBIC AND ANAEROBIC Blood Culture adequate volume   Culture   Final    NO GROWTH 5 DAYS Performed at [facility], [address], [place], [place] [address]    Report Status 07/24/2022 FINAL  Final  Blood culture (routine x 2)     Status: None   Collection Time: 07/19/22  9:29 AM   Specimen: BLOOD RIGHT HAND  Result Value Ref Range Status   Specimen Description BLOOD RIGHT HAND  Final   Special Requests   Final    BOTTLES DRAWN AEROBIC AND ANAEROBIC Blood Culture adequate  volume   Culture   Final    NO GROWTH 5 DAYS Performed at [facility], [place] [address], [place], [place] [address]    Report Status 07/24/2022 FINAL  Final  Surgical PCR screen     Status: Abnormal   Collection Time: 07/19/22 11:00 PM   Specimen: Nasal Mucosa; Nasal Swab  Result Value Ref Range Status   MRSA, PCR POSITIVE (A) NEGATIVE Final    Comment: RESULT CALLED TO, READ BACK BY AND VERIFIED WITH: C ALLEN,RN@0059  07/20/22 MK    Staphylococcus aureus POSITIVE (A) NEGATIVE Final    Comment: (NOTE) The Xpert SA Assay (FDA approved for NASAL specimens in patients 71 years of age and older), is one component  of a comprehensive surveillance program. It is not intended to diagnose infection nor to guide or monitor treatment. Performed at [facility], [place] [address], [place], [place] [address]   Aerobic/Anaerobic Culture w Gram Stain (surgical/deep wound)     Status: None   Collection Time: 07/21/22  4:38 PM   Specimen: PATH Bone biopsy; Tissue  Result Value Ref Range Status   Specimen Description BIOPSY  Final   Special Requests LEFT 4TH METATARSAL TWO  Final   Gram Stain NO WBC SEEN NO ORGANISMS SEEN   Final   Culture   Final    No growth aerobically or anaerobically. Performed at [facility], [place] [address], [place], [place] [address]    Report Status 07/26/2022 FINAL  Final    Today   Subjective    Sarajane Plybon today has no headache,no chest abdominal pain,no new weakness tingling or numbness, feels much better wants to go home today.    Objective   Blood pressure (!) 140/80, pulse 89, temperature 98.2 F (36.8 C), temperature source Oral, resp. rate 18, height 5\' 5"  (1.651 m), weight 88.5 kg, last menstrual period 07/18/2022, SpO2 96 %.   Intake/Output Summary (Last 24 hours) at 07/27/2022 0809 Last data filed at 07/27/2022 0734 Gross per 24 hour  Intake 670.25 ml  Output --  Net 670.25 ml    Exam  Awake Alert, No new F.N deficits,   chronic left-sided weakness and facial droop from previous stroke several months ago, L foot under bandage protective boot bedside [place].AT,PERRAL Supple Neck,   Symmetrical Chest wall movement, Good air movement bilaterally, CTAB RRR,No Gallops,   +ve B.Sounds, Abd Soft, Non tender,  No Cyanosis, Clubbing or edema    Data Review   Recent Labs  Lab 07/22/22 0232 07/24/22 0640 07/25/22 0205 07/26/22 0335 07/27/22 0548  WBC 14.1* 10.2 9.9 11.5* 17.8*  HGB 9.3* 9.0* 8.8* 9.5* 8.8*  HCT 27.8* 28.1* 27.5* 29.9* 28.0*  PLT 588* 614* 574* 633* 619*  MCV 88.0 88.4 89.0 87.7 89.2  MCH 29.4 28.3 28.5 27.9 28.0  MCHC 33.5 32.0 32.0 31.8 31.4  RDW 13.8 14.1 14.2 14.3 14.3    Recent Labs  Lab 07/23/22 0311 07/24/22 0640 07/25/22 0205 07/26/22 0335 07/27/22 0548  NA 134* 136 136 134* 130*  K 4.0 3.9 4.7 4.7 5.0  CL 105 104 106 106 103  CO2 21* 21* 23 21* 18*  ANIONGAP 8 11 7 7 9   GLUCOSE 97 108* 78 114* 336*  BUN 42* 35* 34* 35* 46*  CREATININE 2.65* 2.41* 2.30* 2.33* 2.64*  CRP  --  1.9* 0.9 0.5 0.5  PROCALCITON  --  0.57 0.55 0.47 0.46  INR 1.8* 1.8* 1.6* 1.2 1.2  BNP  --  2,038.3* 1,223.4* 1,226.3* 2,632.3*  MG  --  2.4 2.4 2.5* 2.7*  CALCIUM 8.3* 8.3* 8.1* 8.3* 8.2*    Lab Results  Component Value Date   CHOL 118 07/26/2022   HDL 44 07/26/2022   LDLCALC 57 07/26/2022   LDLDIRECT 84.9 01/01/2020   TRIG 84 07/26/2022   CHOLHDL 2.7 07/26/2022    Total Time in preparing paper work, data evaluation and todays exam - 35 minutes  Signature  -    Lala Lund M.D on 07/27/2022 at 8:09 AM   -  To page go to [email or web address]

## 2022-07-27 NOTE — Discharge Instructions (Addendum)
Follow with Primary MD Johnette Abraham, MD in 3 days   Get CBC, CMP, INR, Magnesium -  checked next visit with your primary MD    Activity: As tolerated with Full fall precautions use walker/cane & assistance as needed, wear the protective boot for your foot at all times when out of the bed.  Nonweightbearing in the left lower extremity.  Disposition Home    Diet: Heart Healthy low carbohydrate diet, check CBGs q. ACH S maintain a logbook and show it to PCP in a week.  Special Instructions: If you have smoked or chewed Tobacco  in the last 2 yrs please stop smoking, stop any regular Alcohol  and or any Recreational drug use.  On your next visit with your primary care physician please Get Medicines reviewed and adjusted.  Please request your Prim.MD to go over all Hospital Tests and Procedure/Radiological results at the follow up, please get all Hospital records sent to your Prim MD by signing hospital release before you go home.  If you experience worsening of your admission symptoms, develop shortness of breath, life threatening emergency, suicidal or homicidal thoughts you must seek medical attention immediately by calling 911 or calling your MD immediately  if symptoms less severe.  You Must read complete instructions/literature along with all the possible adverse reactions/side effects for all the Medicines you take and that have been prescribed to you. Take any new Medicines after you have completely understood and accpet all the possible adverse reactions/side effects.

## 2022-07-27 NOTE — Progress Notes (Signed)
[place] for Lovenox bridge to therapeutic INR  Indication: History of stroke with arterial thrombosis   Allergies  Allergen Reactions   Visipaque [Iodixanol] Nausea And Vomiting    Patient had vagal response during procedure became hypertensive, and bradycardic. CO2 used during procedure prior to contrast being used, this may be cause as well. Recommended premedicating prior to contrast being used in the future.    Wellbutrin [Bupropion] Hives   Cefepime Rash    Tolerates penicilllin   Ciprofloxacin Hcl Hives and Rash    Hives/rash at injection site; 01/15/22 tolerated IV cipro   Tape Rash    Patient Measurements: Height: 5\' 5"  (165.1 cm) Weight: 88.5 kg (195 lb) IBW/kg (Calculated) : 57  Vital Signs: Temp: 97.7 F (36.5 C) (03/26 0328) Temp Source: Oral (03/26 0328) BP: 137/85 (03/26 0328) Pulse Rate: 93 (03/26 0328)  Labs: Recent Labs    07/24/22 0640 07/24/22 1304 07/25/22 0205 07/25/22 1303 07/25/22 2250 07/26/22 0335  HGB 9.0*  --  8.8*  --   --  9.5*  HCT 28.1*  --  27.5*  --   --  29.9*  PLT 614*  --  574*  --   --  633*  LABPROT 20.6*  --  18.6*  --   --  15.1  INR 1.8*  --  1.6*  --   --  1.2  HEPARINUNFRC 0.32   < > 0.11* 0.19* 0.40 0.28*  CREATININE 2.41*  --  2.30*  --   --  2.33*   < > = values in this interval not displayed.    Estimated Creatinine Clearance: 35.3 mL/min (A) (by C-G formula based on SCr of 2.33 mg/dL (H)).   Medical History: Past Medical History:  Diagnosis Date   Anemia    CAD (coronary artery disease)    a. s/p cath in 03/2014 showing 30% mid-LAD, moderate to severe disease along small D1, patent LCx, moderate to severe distal OM2 stenosis and moderate diffuse diease along RCA not amenable to PCI   CHF (congestive heart failure) ([place])    a. EF 55-60% in 12/2019 b. EF at 35-40% by echo in 05/2020   CKD (chronic kidney disease) stage 4, GFR 15-29 ml/min (HCC)    Diabetes mellitus without  complication (HCC)    Myocardial infarction ([place])    Neuropathy    PVD (peripheral vascular disease) ([place])    Stroke ([place]) 01/2022   L hand weakness    Assessment: Warfarin has been re-started, requested to DC heparin drip and start Lovenox bridge back to therapeutic INR as pt is anticipated to DC today  Goal of Therapy:  Monitor platelets by anticoagulation protocol: Yes   Plan:  DC heparin Start Lovenox 1 mg/kg subcutaneous q12h until INR is therapeutic  Anticipated DC today per MD  Narda Bonds, PharmD, BCPS Clinical Pharmacist Phone: [phone]

## 2022-07-27 NOTE — Telephone Encounter (Signed)
   Pt was listed on team A rounds but has been discharged already.  D/w IM Dr. [name] - DC note indicates plan for PCP to follow up Coumadin but our office appears to have been managing Lauren [name]). Patient is going home with a Lovenox->Coumadin bridge. INR check from 3/21 got cancelled as she was here in the hospital. Will route to Lauren [name] and our anticoag pool to help expedite early follow-up given her Lovenox bridge. (I was not sure if [place] anticoag box is checked separately than the main NL hub so included both.) Please review with office pharmD if needed regarding optimal timing. Thank you!  Per Dr. [name] (team A rounder), otherwise OK to keep follow-up with Lauren [name] on 5/8 for cardiology follow-up.

## 2022-07-28 ENCOUNTER — Ambulatory Visit (INDEPENDENT_AMBULATORY_CARE_PROVIDER_SITE_OTHER): Payer: BC Managed Care – PPO | Admitting: Internal Medicine

## 2022-07-28 ENCOUNTER — Telehealth: Payer: Self-pay

## 2022-07-28 ENCOUNTER — Encounter: Payer: Self-pay | Admitting: Internal Medicine

## 2022-07-28 VITALS — BP 102/72 | HR 87 | Ht 65.0 in | Wt 237.6 lb

## 2022-07-28 DIAGNOSIS — R110 Nausea: Secondary | ICD-10-CM | POA: Diagnosis not present

## 2022-07-28 DIAGNOSIS — M869 Osteomyelitis, unspecified: Secondary | ICD-10-CM

## 2022-07-28 DIAGNOSIS — I5020 Unspecified systolic (congestive) heart failure: Secondary | ICD-10-CM

## 2022-07-28 DIAGNOSIS — G4700 Insomnia, unspecified: Secondary | ICD-10-CM

## 2022-07-28 DIAGNOSIS — E1059 Type 1 diabetes mellitus with other circulatory complications: Secondary | ICD-10-CM

## 2022-07-28 DIAGNOSIS — M86172 Other acute osteomyelitis, left ankle and foot: Secondary | ICD-10-CM

## 2022-07-28 DIAGNOSIS — E559 Vitamin D deficiency, unspecified: Secondary | ICD-10-CM

## 2022-07-28 DIAGNOSIS — E1022 Type 1 diabetes mellitus with diabetic chronic kidney disease: Secondary | ICD-10-CM

## 2022-07-28 DIAGNOSIS — I639 Cerebral infarction, unspecified: Secondary | ICD-10-CM | POA: Diagnosis not present

## 2022-07-28 DIAGNOSIS — I2510 Atherosclerotic heart disease of native coronary artery without angina pectoris: Secondary | ICD-10-CM

## 2022-07-28 DIAGNOSIS — E039 Hypothyroidism, unspecified: Secondary | ICD-10-CM

## 2022-07-28 DIAGNOSIS — N184 Chronic kidney disease, stage 4 (severe): Secondary | ICD-10-CM

## 2022-07-28 LAB — LIPOPROTEIN A (LPA): Lipoprotein (a): 94.9 nmol/L — ABNORMAL HIGH (ref <75.0)

## 2022-07-28 MED ORDER — TRAZODONE HCL 100 MG PO TABS: 100.0000 mg | ORAL_TABLET | Freq: Every day | ORAL | 0 refills | Status: DC

## 2022-07-28 MED ORDER — PROCHLORPERAZINE MALEATE 10 MG PO TABS: 10.0000 mg | ORAL_TABLET | Freq: Four times a day (QID) | ORAL | 0 refills | Status: DC | PRN

## 2022-07-28 NOTE — Transitions of Care (Post Inpatient/ED Visit) (Signed)
   07/28/2022  Name: Lauren [name] MRN: [id number] DOB: December 20, 1980  Today's TOC FU Call Status: Today's TOC FU Call Status:: Successful TOC FU Call Competed TOC FU Call Complete Date: 07/28/22  Transition Care Management Follow-up Telephone Call Date of Discharge: 07/27/22 Discharge Facility: [facility]) Type of Discharge: Inpatient Admission Primary Inpatient Discharge Diagnosis:: Left Foot Osteomyelitis, Diabetic ulcer How have you been since you were released from the hospital?: Better Any questions or concerns?: No  Items Reviewed: Did you receive and understand the discharge instructions provided?: Yes Medications obtained and verified?: Yes (Medications Reviewed) Any new allergies since your discharge?: No Dietary orders reviewed?: Yes Type of Diet Ordered:: Diabetic Do you have support at home?: Yes People in Home: child(ren), dependent, spouse Name of Support/Comfort Primary Source: [name] and Equipment/Supplies: Lauren [name] Ordered?: No Any new equipment or medical supplies ordered?: No  Functional Questionnaire: Do you need assistance with bathing/showering or dressing?: No Do you need assistance with meal preparation?: No Do you need assistance with eating?: No Do you have difficulty maintaining continence: No Do you need assistance with getting out of bed/getting out of a chair/moving?: No Do you have difficulty managing or taking your medications?: No  Follow up appointments reviewed: PCP Follow-up appointment confirmed?: Yes Date of PCP follow-up appointment?: 07/28/22 Follow-up Provider: Dr. [name] Specialist [facility] Follow-up appointment confirmed?: Yes Date of Specialist follow-up appointment?: 08/03/22 Follow-Up Specialty Provider:: Dr. Gale Journey Do you need transportation to your follow-up appointment?: No Do you understand care options if your condition(s) worsen?: Yes-patient verbalized understanding  SDOH Interventions Today     Flowsheet Row Most Recent Value  SDOH Interventions   Food Insecurity Interventions Intervention Not Indicated  Housing Interventions Intervention Not Indicated      Lauren Killian, RN, BSN, CCM Care Management Coordinator [facility] Phone: [phone]: [phone]

## 2022-07-28 NOTE — Patient Instructions (Addendum)
It was a pleasure to see you today.  Thank you for giving [name] the opportunity to be involved in your care.  Below is a brief recap of your visit and next steps.  We will plan to see you again in 6 weeks.  Summary Add compazine for nausea NO additional medication changes Follow up for INR check tomorrow Repeat labs ordered for next week Follow up in 6 weeks

## 2022-07-28 NOTE — Progress Notes (Signed)
Established Patient Office Visit  Subjective   Patient ID: Lauren [name], female    DOB: Oct 28, 1980  Age: 42 y.o. MRN: [id number]  Chief Complaint  Patient presents with   Diabetes    Follow up   Lauren [name] returns to care today for follow-up.  She was last seen by me on 1/30 for routine care.  In the interim she has been seen by vascular surgery and underwent aortogram with left lower extremity arteriogram with angioplasty to multiple vessels in the left lower extremity.  She is also been seen by endocrinology, podiatry, and wound care.  Most recently, Lauren [name] presented to the emergency department on 3/18 endorsing chills/vomiting and chest pain.  She was found to have osteomyelitis of her left foot.  She subsequently went amputation of the fifth metatarsal on 3/20.  On 3/21 she endorsed chest pain with elevated troponins.  LHC performed 3/25.  Notable for two-vessel occlusive CAD.  Medical management recommended as there were no suitable targets for revascularization.  She is prescribed warfarin in the setting of prior CVA.  Coumadin was reversed for operative intervention.  She is currently being bridged with Lovenox, which will be discontinued once INR reaches therapeutic goal.  Discharged on doxycycline/Augmentin x 4 weeks in the setting of acute osteomyelitis.  Lauren [name] endorses fatigue and frustration with her current medical conditions.  She has no acute concerns to discuss today.  Past Medical History:  Diagnosis Date   Anemia    CAD (coronary artery disease)    a. s/p cath in 03/2014 showing 30% mid-LAD, moderate to severe disease along small D1, patent LCx, moderate to severe distal OM2 stenosis and moderate diffuse diease along RCA not amenable to PCI   CHF (congestive heart failure) ([place])    a. EF 55-60% in 12/2019 b. EF at 35-40% by echo in 05/2020   CKD (chronic kidney disease) stage 4, GFR 15-29 ml/min (HCC)    Diabetes mellitus without complication (HCC)    Myocardial  infarction ([place])    Neuropathy    PVD (peripheral vascular disease) ([place])    Stroke ([place]) 01/2022   L hand weakness   Past Surgical History:  Procedure Laterality Date   ABDOMINAL AORTOGRAM W/LOWER EXTREMITY N/A 06/10/2022   Procedure: ABDOMINAL AORTOGRAM W/LOWER EXTREMITY;  Surgeon: Marty Heck, MD;  Location: [facility];  Service: Cardiovascular;  Laterality: N/A;   AMPUTATION Left 09/02/2021   Procedure: AMPUTATION RAY;  Surgeon: Criselda Peaches, DPM;  Location: [facility];  Service: Podiatry;  Laterality: Left;  sagittal saw, 3L bag saline & Pulse   Cardiac catherization     CHOLECYSTECTOMY     I & D EXTREMITY Left 08/30/2021   Procedure: IRRIGATION AND DEBRIDEMENT WITH BONE BIOPSY;  Surgeon: [name], DPM;  Location: [facility];  Service: Podiatry;  Laterality: Left;   IR ANGIO VERTEBRAL SEL SUBCLAVIAN INNOMINATE UNI L MOD SED  01/18/2022   IR CT HEAD LTD  01/08/2022   IR INTRA CRAN STENT  01/08/2022   IR PERCUTANEOUS ART THROMBECTOMY/INFUSION INTRACRANIAL INC DIAG ANGIO  01/08/2022   IRRIGATION AND DEBRIDEMENT FOOT Left 09/04/2021   Procedure: IRRIGATION AND DEBRIDEMENT FOOT AND CLOSURE;  Surgeon: Criselda Peaches, DPM;  Location: [facility];  Service: Podiatry;  Laterality: Left;   LEFT HEART CATH AND CORONARY ANGIOGRAPHY N/A 07/26/2022   Procedure: LEFT HEART CATH AND CORONARY ANGIOGRAPHY;  Surgeon: Martinique, Peter M, MD;  Location: [facility];  Service: Cardiovascular;  Laterality:  N/A;   METATARSAL OSTEOTOMY Left 07/21/2022   Procedure: LEFT FOOT EXCISION OF FIFTH METATARSAL WITH DEBRIDEMENT OF ULCER, POSSIBLE BONE BIOPSY OF THE FOURTH METATARSAL, POSSIBLE FOURTH RAY AMPUTATION OF LEFT TOEAND METATARSAL;  Surgeon: Trula Slade, DPM;  Location: [facility];  Service: Podiatry;  Laterality: Left;   PERIPHERAL VASCULAR BALLOON ANGIOPLASTY Left 06/10/2022   Procedure: PERIPHERAL VASCULAR BALLOON ANGIOPLASTY;  Surgeon: Marty Heck, MD;  Location: [facility];  Service:  Cardiovascular;  Laterality: Left;  AT and DP   PERIPHERAL VASCULAR INTERVENTION Left 06/10/2022   Procedure: PERIPHERAL VASCULAR INTERVENTION;  Surgeon: Marty Heck, MD;  Location: [facility];  Service: Cardiovascular;  Laterality: Left;  SFA   RADIOLOGY WITH ANESTHESIA N/A 01/08/2022   Procedure: IR WITH ANESTHESIA;  Surgeon: Radiologist, Medication, MD;  Location: [facility];  Service: Radiology;  Laterality: N/A;   THROMBECTOMY BRACHIAL ARTERY Right 11/18/2021   Procedure: RIGHT BRACHIAL, RADIAL, & ULNAR ARTERY THROMBECTOMY.;  Surgeon: Marty Heck, MD;  Location: [facility];  Service: Vascular;  Laterality: Right;   TUBAL LIGATION     Social History   Tobacco Use   Smoking status: Former    Packs/day: 1.00    Years: 15.00    Additional pack years: 0.00    Total pack years: 15.00    Types: Cigarettes    Quit date: 01/2022    Years since quitting: 0.5    Passive exposure: Past   Smokeless tobacco: Never  Vaping Use   Vaping Use: Never used  Substance Use Topics   Alcohol use: Yes    Comment: occ   Drug use: No   Family History  Problem Relation Age of Onset   Thyroid disease Mother    Hypertension Mother    Hyperlipidemia Mother    CAD Mother    CVA Mother    Hyperlipidemia Father    Hypertension Father    CAD Father    Breast cancer Paternal Grandmother 42   Cancer Paternal Grandmother        lung   Allergies  Allergen Reactions   Visipaque [Iodixanol] Nausea And Vomiting    Patient had vagal response during procedure became hypertensive, and bradycardic. CO2 used during procedure prior to contrast being used, this may be cause as well. Recommended premedicating prior to contrast being used in the future.    Wellbutrin [Bupropion] Hives   Cefepime Rash    Tolerates penicilllin   Ciprofloxacin Hcl Hives and Rash    Hives/rash at injection site; 01/15/22 tolerated IV cipro   Tape Rash   Review of Systems  Constitutional:  Positive for malaise/fatigue.   Gastrointestinal:  Positive for nausea.  All other systems reviewed and are negative.     Objective:     BP 102/72   Pulse 87   Ht 5\' 5"  (1.651 m)   Wt 237 lb 9.6 oz (107.8 kg)   LMP 07/18/2022 (Approximate)   SpO2 99%   BMI 39.54 kg/m  BP Readings from Last 3 Encounters:  08/02/22 (!) 155/91  07/28/22 102/72  07/27/22 (!) 140/80   Physical Exam Vitals reviewed.  Constitutional:      Comments: Examined in wheelchair  HENT:     Head: Normocephalic and atraumatic.     Right Ear: External ear normal.     Left Ear: External ear normal.     Nose: Nose normal.     Mouth/Throat:     Mouth: Mucous membranes are moist.  Pharynx: Oropharynx is clear. No oropharyngeal exudate or posterior oropharyngeal erythema.  Eyes:     General: No scleral icterus.    Extraocular Movements: Extraocular movements intact.     Conjunctiva/sclera: Conjunctivae normal.     Pupils: Pupils are equal, round, and reactive to light.  Cardiovascular:     Rate and Rhythm: Normal rate and regular rhythm.     Pulses: Normal pulses.     Heart sounds: Murmur heard.  Pulmonary:     Effort: Pulmonary effort is normal.     Breath sounds: Normal breath sounds. No wheezing, rhonchi or rales.  Abdominal:     General: Abdomen is flat. Bowel sounds are normal.     Palpations: Abdomen is soft.  Musculoskeletal:     Cervical back: Normal range of motion.     Comments: Walking boot on left foot  Skin:    General: Skin is warm and dry.  Neurological:     Mental Status: She is alert and oriented to person, place, and time.     Cranial Nerves: No cranial nerve deficit.     Motor: Weakness (Left arm and leg.  Weakness is more prominent in the left upper extremity) present.  Psychiatric:        Mood and Affect: Mood normal.        Behavior: Behavior normal.   Last CBC Lab Results  Component Value Date   WBC 10.2 08/02/2022   HGB 9.0 (L) 08/02/2022   HCT 29.9 (L) 08/02/2022   MCV 92.0 08/02/2022   MCH  27.7 08/02/2022   RDW 14.3 08/02/2022   PLT 518 (H) [id number]   Last metabolic panel Lab Results  Component Value Date   GLUCOSE 203 (H) 08/02/2022   NA 140 08/02/2022   K 4.4 08/02/2022   CL 108 08/02/2022   CO2 22 08/02/2022   BUN 30 (H) 08/02/2022   CREATININE 2.57 (H) 08/02/2022   GFRNONAA 24 (L) 08/02/2022   CALCIUM 8.1 (L) 08/02/2022   PHOS 4.4 01/29/2022   PROT 6.8 05/08/2022   ALBUMIN 2.8 (L) 05/08/2022   LABGLOB 3.2 04/20/2022   AGRATIO 1.0 (L) 04/20/2022   BILITOT 0.3 05/08/2022   ALKPHOS 107 05/08/2022   AST 31 05/08/2022   ALT 41 05/08/2022   ANIONGAP 10 08/02/2022   Last lipids Lab Results  Component Value Date   CHOL 118 07/26/2022   HDL 44 07/26/2022   LDLCALC 57 07/26/2022   LDLDIRECT 84.9 01/01/2020   TRIG 84 07/26/2022   CHOLHDL 2.7 07/26/2022   Last hemoglobin A1c Lab Results  Component Value Date   HGBA1C 7.0 (H) 07/19/2022   Last thyroid functions Lab Results  Component Value Date   TSH 17.600 (H) 04/20/2022   Last vitamin D Lab Results  Component Value Date   VD25OH 15.9 (L) 06/01/2022   Last vitamin B12 and Folate Lab Results  Component Value Date   [id number] 04/20/2022   FOLATE 10.3 04/20/2022      Assessment & Plan:   Problem List Items Addressed This Visit       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  A1c 7.0 earlier this month.  Endocrinology follow-up scheduled for June.      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  Followed by neurology.  Remains on warfarin, which was reversed during recent hospital admission in the setting of amputation of the fifth metatarsal on 3/20.  Warfarin resumed postoperatively.  She is currently being bridged with Lovenox until INR achieves therapeutic goal. -Coumadin clinic follow-up scheduled for tomorrow (3/28). -Currently has an appointment with hematology at [facility] for a second opinion in late May.       CAD (coronary artery disease)    Known history of extensive CAD.  Endorsing chest pain during recent hospital admission on 3/21.  Troponins elevated.  LHC performed 3/25 and was notable for two-vessel occlusive disease.  Medical management recommended as there were no suitable targets for revascularization.  Denies chest pain since hospital discharge. -No medication changes today.  Continue Plavix/warfarin, atorvastatin, metoprolol, and ranolazine -Cardiology follow-up scheduled for next month      Osteomyelitis    Recent hospital admission in the setting of osteomyelitis, ultimately requiring amputation of the fifth metatarsal of the left foot on 3/20.  Biopsy of the fourth metatarsal did not show any active infection.  She was discharged on Augmentin and doxycycline.  Currently wearing a boot on her left foot. -ID and podiatry follow-up scheduled for next week.      Vitamin D deficiency   Relevant Orders   Vitamin D (25 hydroxy)   Insomnia    Previously documented history of insomnia.  She is currently prescribed trazodone 50 mg nightly.  She is interested in increasing this today as it has not been effective. -Increase trazodone 100 mg nightly      Nausea - Primary    She endorses frequent nausea and states that Compazine is most effective.  Requesting a refill today.       Return in about 6 weeks (around 09/08/2022).    Johnette Abraham, MD

## 2022-07-29 ENCOUNTER — Ambulatory Visit: Payer: BC Managed Care – PPO | Attending: Cardiology | Admitting: *Deleted

## 2022-07-29 ENCOUNTER — Ambulatory Visit (HOSPITAL_COMMUNITY): Payer: BC Managed Care – PPO

## 2022-07-29 DIAGNOSIS — I639 Cerebral infarction, unspecified: Secondary | ICD-10-CM

## 2022-07-29 DIAGNOSIS — Z5181 Encounter for therapeutic drug level monitoring: Secondary | ICD-10-CM | POA: Diagnosis not present

## 2022-07-29 LAB — POCT INR: INR: 1.7 — AB (ref 2.0–3.0)

## 2022-07-29 NOTE — Patient Instructions (Signed)
Continue warfarin 1 tablet daily except 1 1/2 tablets on Wednesdays and Saturdays.  Continue Lovenox twice daily Recheck on Monday

## 2022-08-02 ENCOUNTER — Encounter: Payer: Self-pay | Admitting: Internal Medicine

## 2022-08-02 ENCOUNTER — Ambulatory Visit: Payer: BC Managed Care – PPO | Attending: Cardiology | Admitting: *Deleted

## 2022-08-02 ENCOUNTER — Emergency Department (HOSPITAL_COMMUNITY): Payer: BC Managed Care – PPO

## 2022-08-02 ENCOUNTER — Inpatient Hospital Stay (HOSPITAL_COMMUNITY)
Admission: EM | Admit: 2022-08-02 | Discharge: 2022-08-05 | DRG: 281 | Disposition: A | Discharge status: Home / Self Care | Payer: BC Managed Care – PPO | Attending: Family Medicine | Admitting: Family Medicine

## 2022-08-02 DIAGNOSIS — M868X7 Other osteomyelitis, ankle and foot: Secondary | ICD-10-CM | POA: Diagnosis present

## 2022-08-02 DIAGNOSIS — Z66 Do not resuscitate: Secondary | ICD-10-CM | POA: Diagnosis not present

## 2022-08-02 DIAGNOSIS — I739 Peripheral vascular disease, unspecified: Secondary | ICD-10-CM | POA: Diagnosis present

## 2022-08-02 DIAGNOSIS — I214 Non-ST elevation (NSTEMI) myocardial infarction: Secondary | ICD-10-CM | POA: Diagnosis not present

## 2022-08-02 DIAGNOSIS — I21A1 Myocardial infarction type 2: Principal | ICD-10-CM | POA: Diagnosis present

## 2022-08-02 DIAGNOSIS — R0789 Other chest pain: Secondary | ICD-10-CM | POA: Diagnosis not present

## 2022-08-02 DIAGNOSIS — E782 Mixed hyperlipidemia: Secondary | ICD-10-CM | POA: Diagnosis present

## 2022-08-02 DIAGNOSIS — Z6839 Body mass index (BMI) 39.0-39.9, adult: Secondary | ICD-10-CM

## 2022-08-02 DIAGNOSIS — E1069 Type 1 diabetes mellitus with other specified complication: Secondary | ICD-10-CM | POA: Diagnosis present

## 2022-08-02 DIAGNOSIS — I2510 Atherosclerotic heart disease of native coronary artery without angina pectoris: Secondary | ICD-10-CM | POA: Diagnosis present

## 2022-08-02 DIAGNOSIS — R112 Nausea with vomiting, unspecified: Secondary | ICD-10-CM | POA: Insufficient documentation

## 2022-08-02 DIAGNOSIS — R079 Chest pain, unspecified: Secondary | ICD-10-CM | POA: Diagnosis present

## 2022-08-02 DIAGNOSIS — Z713 Dietary counseling and surveillance: Secondary | ICD-10-CM

## 2022-08-02 DIAGNOSIS — Z7902 Long term (current) use of antithrombotics/antiplatelets: Secondary | ICD-10-CM

## 2022-08-02 DIAGNOSIS — R7989 Other specified abnormal findings of blood chemistry: Secondary | ICD-10-CM | POA: Diagnosis not present

## 2022-08-02 DIAGNOSIS — Z5181 Encounter for therapeutic drug level monitoring: Secondary | ICD-10-CM

## 2022-08-02 DIAGNOSIS — E1022 Type 1 diabetes mellitus with diabetic chronic kidney disease: Secondary | ICD-10-CM | POA: Diagnosis not present

## 2022-08-02 DIAGNOSIS — E6609 Other obesity due to excess calories: Secondary | ICD-10-CM | POA: Diagnosis present

## 2022-08-02 DIAGNOSIS — Z79899 Other long term (current) drug therapy: Secondary | ICD-10-CM | POA: Diagnosis not present

## 2022-08-02 DIAGNOSIS — I5022 Chronic systolic (congestive) heart failure: Secondary | ICD-10-CM | POA: Diagnosis not present

## 2022-08-02 DIAGNOSIS — Z8249 Family history of ischemic heart disease and other diseases of the circulatory system: Secondary | ICD-10-CM

## 2022-08-02 DIAGNOSIS — Z794 Long term (current) use of insulin: Secondary | ICD-10-CM

## 2022-08-02 DIAGNOSIS — Z87891 Personal history of nicotine dependence: Secondary | ICD-10-CM

## 2022-08-02 DIAGNOSIS — G8929 Other chronic pain: Secondary | ICD-10-CM | POA: Diagnosis present

## 2022-08-02 DIAGNOSIS — Z1152 Encounter for screening for COVID-19: Secondary | ICD-10-CM | POA: Diagnosis not present

## 2022-08-02 DIAGNOSIS — Z79890 Hormone replacement therapy (postmenopausal): Secondary | ICD-10-CM

## 2022-08-02 DIAGNOSIS — E1059 Type 1 diabetes mellitus with other circulatory complications: Secondary | ICD-10-CM | POA: Diagnosis present

## 2022-08-02 DIAGNOSIS — I639 Cerebral infarction, unspecified: Secondary | ICD-10-CM

## 2022-08-02 DIAGNOSIS — E66812 Obesity, class 2: Secondary | ICD-10-CM

## 2022-08-02 DIAGNOSIS — I130 Hypertensive heart and chronic kidney disease with heart failure and stage 1 through stage 4 chronic kidney disease, or unspecified chronic kidney disease: Secondary | ICD-10-CM | POA: Diagnosis present

## 2022-08-02 DIAGNOSIS — E1040 Type 1 diabetes mellitus with diabetic neuropathy, unspecified: Secondary | ICD-10-CM | POA: Diagnosis not present

## 2022-08-02 DIAGNOSIS — Z881 Allergy status to other antibiotic agents status: Secondary | ICD-10-CM | POA: Diagnosis not present

## 2022-08-02 DIAGNOSIS — R110 Nausea: Secondary | ICD-10-CM | POA: Insufficient documentation

## 2022-08-02 DIAGNOSIS — D631 Anemia in chronic kidney disease: Secondary | ICD-10-CM | POA: Diagnosis not present

## 2022-08-02 DIAGNOSIS — I252 Old myocardial infarction: Secondary | ICD-10-CM

## 2022-08-02 DIAGNOSIS — Z9641 Presence of insulin pump (external) (internal): Secondary | ICD-10-CM | POA: Diagnosis present

## 2022-08-02 DIAGNOSIS — E1051 Type 1 diabetes mellitus with diabetic peripheral angiopathy without gangrene: Secondary | ICD-10-CM | POA: Diagnosis not present

## 2022-08-02 DIAGNOSIS — Z6836 Body mass index (BMI) 36.0-36.9, adult: Secondary | ICD-10-CM

## 2022-08-02 DIAGNOSIS — E039 Hypothyroidism, unspecified: Secondary | ICD-10-CM | POA: Diagnosis not present

## 2022-08-02 DIAGNOSIS — Z823 Family history of stroke: Secondary | ICD-10-CM

## 2022-08-02 DIAGNOSIS — Z8673 Personal history of transient ischemic attack (TIA), and cerebral infarction without residual deficits: Secondary | ICD-10-CM

## 2022-08-02 DIAGNOSIS — Z83438 Family history of other disorder of lipoprotein metabolism and other lipidemia: Secondary | ICD-10-CM

## 2022-08-02 DIAGNOSIS — Z955 Presence of coronary angioplasty implant and graft: Secondary | ICD-10-CM

## 2022-08-02 DIAGNOSIS — N184 Chronic kidney disease, stage 4 (severe): Secondary | ICD-10-CM | POA: Diagnosis not present

## 2022-08-02 DIAGNOSIS — N3289 Other specified disorders of bladder: Secondary | ICD-10-CM | POA: Diagnosis not present

## 2022-08-02 DIAGNOSIS — R0602 Shortness of breath: Secondary | ICD-10-CM | POA: Diagnosis not present

## 2022-08-02 DIAGNOSIS — Z7901 Long term (current) use of anticoagulants: Secondary | ICD-10-CM

## 2022-08-02 DIAGNOSIS — Z89422 Acquired absence of other left toe(s): Secondary | ICD-10-CM

## 2022-08-02 DIAGNOSIS — I2089 Other forms of angina pectoris: Secondary | ICD-10-CM | POA: Diagnosis present

## 2022-08-02 DIAGNOSIS — Z91041 Radiographic dye allergy status: Secondary | ICD-10-CM

## 2022-08-02 DIAGNOSIS — I10 Essential (primary) hypertension: Secondary | ICD-10-CM | POA: Diagnosis not present

## 2022-08-02 DIAGNOSIS — I69354 Hemiplegia and hemiparesis following cerebral infarction affecting left non-dominant side: Secondary | ICD-10-CM

## 2022-08-02 DIAGNOSIS — I25118 Atherosclerotic heart disease of native coronary artery with other forms of angina pectoris: Secondary | ICD-10-CM | POA: Diagnosis not present

## 2022-08-02 DIAGNOSIS — R197 Diarrhea, unspecified: Secondary | ICD-10-CM | POA: Diagnosis not present

## 2022-08-02 DIAGNOSIS — Z91048 Other nonmedicinal substance allergy status: Secondary | ICD-10-CM

## 2022-08-02 DIAGNOSIS — R0782 Intercostal pain: Secondary | ICD-10-CM | POA: Diagnosis not present

## 2022-08-02 LAB — CBC
HCT: 29.9 % — ABNORMAL LOW (ref 36.0–46.0)
Hemoglobin: 9 g/dL — ABNORMAL LOW (ref 12.0–15.0)
MCH: 27.7 pg (ref 26.0–34.0)
MCHC: 30.1 g/dL (ref 30.0–36.0)
MCV: 92 fL (ref 80.0–100.0)
Platelets: 518 10*3/uL — ABNORMAL HIGH (ref 150–400)
RBC: 3.25 MIL/uL — ABNORMAL LOW (ref 3.87–5.11)
RDW: 14.3 % (ref 11.5–15.5)
WBC: 10.2 10*3/uL (ref 4.0–10.5)
nRBC: 0 % (ref 0.0–0.2)

## 2022-08-02 LAB — BASIC METABOLIC PANEL
Anion gap: 10 (ref 5–15)
BUN: 30 mg/dL — ABNORMAL HIGH (ref 6–20)
CO2: 22 mmol/L (ref 22–32)
Calcium: 8.1 mg/dL — ABNORMAL LOW (ref 8.9–10.3)
Chloride: 108 mmol/L (ref 98–111)
Creatinine, Ser: 2.57 mg/dL — ABNORMAL HIGH (ref 0.44–1.00)
GFR, Estimated: 24 mL/min — ABNORMAL LOW (ref >60)
Glucose, Bld: 203 mg/dL — ABNORMAL HIGH (ref 70–99)
Potassium: 4.4 mmol/L (ref 3.5–5.1)
Sodium: 140 mmol/L (ref 135–145)

## 2022-08-02 LAB — POCT INR: INR: 3 (ref 2.0–3.0)

## 2022-08-02 LAB — HEPATIC FUNCTION PANEL
ALT: 22 U/L (ref 0–44)
AST: 25 U/L (ref 15–41)
Albumin: 2.4 g/dL — ABNORMAL LOW (ref 3.5–5.0)
Alkaline Phosphatase: 95 U/L (ref 38–126)
Bilirubin, Direct: 0.1 mg/dL (ref 0.0–0.2)
Indirect Bilirubin: 0.2 mg/dL — ABNORMAL LOW (ref 0.3–0.9)
Total Bilirubin: 0.3 mg/dL (ref 0.3–1.2)
Total Protein: 6.2 g/dL — ABNORMAL LOW (ref 6.5–8.1)

## 2022-08-02 LAB — PROTIME-INR
INR: 2 — ABNORMAL HIGH (ref 0.8–1.2)
Prothrombin Time: 22.8 seconds — ABNORMAL HIGH (ref 11.4–15.2)

## 2022-08-02 LAB — TROPONIN I (HIGH SENSITIVITY)
Troponin I (High Sensitivity): 387 ng/L (ref <18)
Troponin I (High Sensitivity): 395 ng/L (ref <18)

## 2022-08-02 LAB — I-STAT BETA HCG BLOOD, ED (MC, WL, AP ONLY): I-stat hCG, quantitative: <5 m[IU]/mL (ref <5)

## 2022-08-02 LAB — BRAIN NATRIURETIC PEPTIDE: B Natriuretic Peptide: 869.3 pg/mL — ABNORMAL HIGH (ref 0.0–100.0)

## 2022-08-02 MED ORDER — MORPHINE SULFATE (PF) 2 MG/ML IV SOLN
2.0000 mg | Freq: Once | INTRAVENOUS | Status: AC
  Administered 2022-08-03: 2 mg via INTRAVENOUS
  Filled 2022-08-02: qty 1

## 2022-08-02 NOTE — Patient Instructions (Signed)
Decrease warfarin to 1 tablet daily except 1 1/2 tablets on Saturdays.  Stop Lovenox injections Recheck in 1 wk On Augmenting and Doxycycline x 25 days

## 2022-08-02 NOTE — ED Notes (Signed)
Pt failed to provide the stool sample, will try again later.

## 2022-08-02 NOTE — ED Provider Notes (Signed)
[facility] Provider Note   CSN: [id number] Arrival date & time: 08/02/22  1952     History  Chief Complaint  Patient presents with   Chest Pain    Lauren [name] is a 42 y.o. female.  HPI 42 year old female with history of PVD, DM type I, hypothyroidism, hyperlipidemia, CKD 4, unstable angina, osteomyelitis of the left foot, MCA stroke  who presents  to the ER with complaints of chest pain. She was just discharged from the hospital, was admitted for left diabetic foot infection requiring IV antibiotics with as well as NSTEMI vs demand ischemia, had a heart cath which showed three-vessel disease not amenable to intervention.  She was on Coumadin prior to hospitalization, which was reversed postoperatively.  She was discharged on a Lovenox/Coumadin bridge.  Patient reports onset of watery diarrhea this morning and chest pain.  She took 2 sublingual nitroglycerin with no relief.  She reports chest pain similar to when she was hospitalized.  Denies shortness of breath.  Endorses mild abdominal discomfort but no significant pain.    Home Medications Prior to Admission medications   Medication Sig Start Date End Date Taking? Authorizing Provider  acetaminophen (TYLENOL) 325 MG tablet Take 1-2 tablets (325-650 mg total) by mouth every 4 (four) hours as needed for mild pain. Patient taking differently: Take 650 mg by mouth as needed for mild pain. 02/18/22  Yes Love, Ivan Anchors, PA-C  amoxicillin-clavulanate (AUGMENTIN) 500-125 MG tablet Take 1 tablet by mouth 2 (two) times daily for 25 days. 07/27/22 08/21/22 Yes Thurnell Lose, MD  atorvastatin (LIPITOR) 40 MG tablet Take 1 tablet (40 mg total) by mouth daily. Courtesy fill/ pt to get established with a primary MD for further refills 06/01/22 11/28/22 Yes Johnette Abraham, MD  clopidogrel (PLAVIX) 75 MG tablet Take 1 tablet (75 mg total) by mouth daily. 04/29/22  Yes Garvin Fila, MD  diclofenac  Sodium (VOLTAREN ARTHRITIS PAIN) 1 % GEL Apply 2 g topically 4 (four) times daily. Patient taking differently: Apply 1 Application topically 2 (two) times daily as needed (pain). 04/19/22  Yes Jennye Boroughs, MD  doxycycline (VIBRA-TABS) 100 MG tablet Take 1 tablet (100 mg total) by mouth every 12 (twelve) hours for 25 days. 07/27/22 08/21/22 Yes Thurnell Lose, MD  furosemide (LASIX) 40 MG tablet Take 40 mg daily as needed for swelling 03/05/22  Yes Branch, Alphonse Guild, MD  hydrALAZINE (APRESOLINE) 25 MG tablet Take 0.5 tablets (12.5 mg total) by mouth 2 (two) times daily. 05/10/22  Yes BranchAlphonse Guild, MD  isosorbide mononitrate (ISMO) 20 MG tablet Take 1 tablet (20 mg total) by mouth 2 (two) times daily. Courtesy fill/ pt to get established with a primary MD for further refills 06/01/22 11/28/22 Yes Johnette Abraham, MD  levothyroxine (SYNTHROID) 125 MCG tablet Take 1 tablet (125 mcg total) by mouth daily at 6 (six) AM. 06/17/22 06/12/23 Yes Brita Romp, NP  magnesium oxide (MAG-OX) 400 MG tablet Take 0.5 tablets (200 mg total) by mouth at bedtime. Patient taking differently: Take 400 mg by mouth every other day. 03/12/22  Yes Benay Pike, MD  metoprolol succinate (TOPROL XL) 50 MG 24 hr tablet Take 1 tablet (50 mg total) by mouth daily. 05/10/22  Yes BranchAlphonse Guild, MD  Multiple Vitamin (MULTIVITAMIN WITH MINERALS) TABS tablet Take 1 tablet by mouth daily. 02/18/22  Yes Love, Ivan Anchors, PA-C  nitroGLYCERIN (NITROSTAT) 0.4 MG SL tablet Place 1 tablet (0.4  mg total) under the tongue every 5 (five) minutes x 3 doses as needed for chest pain. 02/18/22  Yes Love, Ivan Anchors, PA-C  oxyCODONE (OXY IR/ROXICODONE) 5 MG immediate release tablet Take 1 tablet (5 mg total) by mouth every 6 (six) hours as needed for severe pain. 07/08/22  Yes McDonald, Stephan Minister, DPM  pantoprazole (PROTONIX) 40 MG tablet Take 1 tablet (40 mg total) by mouth daily. Courtesy fill/ pt to get established with a primary MD for  further refills Patient taking differently: Take 40 mg by mouth daily as needed (reflux). 06/01/22 11/28/22 Yes Johnette Abraham, MD  polyethylene glycol (MIRALAX / GLYCOLAX) 17 g packet Take 17 g by mouth 2 (two) times daily. Patient taking differently: Take 17 g by mouth as needed for moderate constipation. 03/03/22  Yes Bayard Hugger, NP  ranolazine (RANEXA) 500 MG 12 hr tablet Take 1 tablet (500 mg total) by mouth 2 (two) times daily. Courtesy fill/ pt to get established with a primary MD for further refills 06/01/22 08/30/22 Yes Johnette Abraham, MD  SANTYL 250 UNIT/GM ointment Apply 1 Application topically daily. 05/31/22  Yes [provider]  tiZANidine (ZANAFLEX) 4 MG tablet Take 1 tablet (4 mg total) by mouth at bedtime. Courtesy fill/ pt to get established with a primary MD for further refills 06/01/22  Yes Johnette Abraham, MD  traZODone (DESYREL) 100 MG tablet Take 1 tablet (100 mg total) by mouth at bedtime. 07/28/22  Yes Johnette Abraham, MD  warfarin (COUMADIN) 5 MG tablet TAKE ONE TABLET BY MOUTH DAILY EXCEPT 1 AND 1/2 TABLET ON WEDNESDAYS AND SATURDAYS OR AS DIRECTED Patient taking differently: Take 5-7.5 mg by mouth every evening. TAKE ONE TABLET BY MOUTH DAILY EXCEPT 1 AND 1/2 TABLET ON [facility] AND SATURDAYS OR AS DIRECTED 06/15/22  Yes Branch, Alphonse Guild, MD  Continuous Blood Gluc Sensor (DEXCOM G6 SENSOR) MISC Change sensor every 10 days as directed 09/29/21   Brita Romp, NP  enoxaparin (LOVENOX) 100 MG/ML injection Inject 90 mg (0.9 mL) twice daily, Stop once INR reaches 2.  Follow-up with your PCP within 2 to 3 days for INR check. Patient not taking: Reported on 08/03/2022 07/27/22   Thurnell Lose, MD  insulin aspart (NOVOLOG) 100 UNIT/ML injection Use with Omnipod for daily dose around 80 units daily 06/17/22   Brita Romp, NP  Insulin Disposable Pump (OMNIPOD 5 G6 PODS, GEN 5,) MISC Change pod every 48-72 hours 06/17/22   Brita Romp, NP  Insulin  Disposable Pump (OMNIPOD 5 G6 PODS, GEN 5,) MISC Change pod every 48-72 hours 07/07/22   Brita Romp, NP  prochlorperazine (COMPAZINE) 10 MG tablet Take 1 tablet (10 mg total) by mouth every 6 (six) hours as needed for nausea or vomiting. Patient not taking: Reported on 08/03/2022 07/28/22   Johnette Abraham, MD      Allergies    Visipaque [iodixanol], Wellbutrin [bupropion], Cefepime, Ciprofloxacin hcl, and Tape    Review of Systems   Review of Systems Ten systems reviewed and are negative for acute change, except as noted in the HPI.   Physical Exam Updated Vital Signs BP 137/73   Pulse 93   Temp 98.6 F (37 C)   Resp 12   Ht 5\' 5"  (1.651 m)   Wt 107.8 kg   LMP 07/18/2022 (Approximate)   SpO2 100%   BMI 39.54 kg/m  Physical Exam Vitals and nursing note reviewed.  Constitutional:  General: She is not in acute distress.    Appearance: She is well-developed.     Comments: Chronically ill-appearing female, in no acute distress  HENT:     Head: Normocephalic and atraumatic.  Eyes:     Conjunctiva/sclera: Conjunctivae normal.  Cardiovascular:     Rate and Rhythm: Normal rate and regular rhythm.     Heart sounds: No murmur heard. Pulmonary:     Effort: Pulmonary effort is normal. No respiratory distress.     Breath sounds: Normal breath sounds.  Abdominal:     Palpations: Abdomen is soft.     Tenderness: There is no abdominal tenderness.  Musculoskeletal:        General: No swelling.     Cervical back: Neck supple.  Skin:    General: Skin is warm and dry.     Capillary Refill: Capillary refill takes less than 2 seconds.  Neurological:     Mental Status: She is alert.  Psychiatric:        Mood and Affect: Mood normal.     ED Results / Procedures / Treatments   Labs (all labs ordered are listed, but only abnormal results are displayed) Labs Reviewed  BASIC METABOLIC PANEL - Abnormal; Notable for the following components:      Result Value   Glucose, Bld  203 (*)    BUN 30 (*)    Creatinine, Ser 2.57 (*)    Calcium 8.1 (*)    GFR, Estimated 24 (*)    All other components within normal limits  CBC - Abnormal; Notable for the following components:   RBC 3.25 (*)    Hemoglobin 9.0 (*)    HCT 29.9 (*)    Platelets 518 (*)    All other components within normal limits  BRAIN NATRIURETIC PEPTIDE - Abnormal; Notable for the following components:   B Natriuretic Peptide 869.3 (*)    All other components within normal limits  HEPATIC FUNCTION PANEL - Abnormal; Notable for the following components:   Total Protein 6.2 (*)    Albumin 2.4 (*)    Indirect Bilirubin 0.2 (*)    All other components within normal limits  PROTIME-INR - Abnormal; Notable for the following components:   Prothrombin Time 22.8 (*)    INR 2.0 (*)    All other components within normal limits  TROPONIN I (HIGH SENSITIVITY) - Abnormal; Notable for the following components:   Troponin I (High Sensitivity) 387 (*)    All other components within normal limits  TROPONIN I (HIGH SENSITIVITY) - Abnormal; Notable for the following components:   Troponin I (High Sensitivity) 395 (*)    All other components within normal limits  RESP PANEL BY RT-PCR (RSV, FLU A&B, COVID)  RVPGX2  C DIFFICILE QUICK SCREEN W PCR REFLEX    GASTROINTESTINAL PANEL BY PCR, STOOL (REPLACES STOOL CULTURE)  I-STAT BETA HCG BLOOD, ED ([facility], [facility], [facility] ONLY)    EKG EKG Interpretation  Date/Time:  Monday August 02 2022 20:01:41 EDT Ventricular Rate:  94 PR Interval:  134 QRS Duration: 92 QT Interval:  354 QTC Calculation: 442 R Axis:   93 Text Interpretation: Normal sinus rhythm Rightward axis nonspecific T wave flattening Confirmed by Sherwood Gambler [phone]) on 08/02/2022 9:46:19 PM  Radiology DG Chest 2 View  Result Date: 08/02/2022 CLINICAL DATA:  Sob, chest pain EXAM: CHEST - 2 VIEW COMPARISON:  07/19/2022 FINDINGS: The heart size and mediastinal contours are within normal limits. There is diffuse  pulmonary interstitial prominence  which could be seen with atypical infection, edema or interstitial lung disease. There is no focal consolidation. No pneumothorax or pleural effusion. The visualized osseous structures are unremarkable. IMPRESSION: Nonspecific interstitial prominence consistent with atypical infection, edema or interstitial lung disease. No focal consolidation. Electronically Signed   By: Sammie Bench M.D.   On: 08/02/2022 20:39    Procedures Procedures    Medications Ordered in ED Medications  morphine (PF) 2 MG/ML injection 2 mg (2 mg Intravenous Given 08/03/22 0028)  prochlorperazine (COMPAZINE) injection 10 mg (10 mg Intravenous Given 08/03/22 0123)  diphenhydrAMINE (BENADRYL) injection 25 mg (25 mg Intravenous Given 08/03/22 0123)    ED Course/ Medical Decision Making/ A&P                             Medical Decision Making Amount and/or Complexity of Data Reviewed Labs: ordered. Radiology: ordered.  Risk Prescription drug management. Decision regarding hospitalization.  42 year old female presenting with chest pain.  Mildly hypertensive on arrival but afebrile, other vitals are stable.  DDx includes unstable angina/ACS, PE, pneumonia, dissection.  Labs ordered, reviewed, hemoglobin of 9, anemia appears to be stable.  BMP without any significant Electra abnormalities, creatinine appears to be at baseline.  BNP elevated but improved from prior.  LFTs are normal.  Troponins are elevated but flat, improved from prior.  COVID and flu are negative.  GI panel ordered given recent initiation of antibiotics.  On exam, her abdomen is soft, no guarding, very minimally generally tender, nonfocal.  Suspect diarrhea secondary to antibiotics. CDIFF negative, GI panel pending.   I consulted cardiology regarding the patient's chest pain, who will see and evaluate the patient.  They suspect that she will need admission.  1:43AM: Spoke w/ cardiology who recommends admission. Given her  comorbidities, requesting admit to medicine. Hospitalist paged. Patient had persistent nausea, CTAP ordered to rule out intra-abdominal infection.     Final Clinical Impression(s) / ED Diagnoses Final diagnoses:  Chest pain, unspecified type  Diarrhea, unspecified type    Rx / DC Orders ED Discharge Orders     None         Lyndel Safe 08/03/22 0226    Quintella Reichert, MD 08/03/22 [phone]

## 2022-08-02 NOTE — ED Notes (Signed)
Patient in RR, Leads are off

## 2022-08-02 NOTE — Assessment & Plan Note (Signed)
She endorses frequent nausea and states that Compazine is most effective.  Requesting a refill today.

## 2022-08-02 NOTE — ED Triage Notes (Signed)
Patient arrived from home reports central chest pain with SOB , diarrhea and lightheaded today , she took 2 NTG sl with no relief , history of CAD /Stents , her cardiologist is Dr. Harl Bowie.

## 2022-08-02 NOTE — Assessment & Plan Note (Signed)
Previously documented history of insomnia.  She is currently prescribed trazodone 50 mg nightly.  She is interested in increasing this today as it has not been effective. -Increase trazodone 100 mg nightly

## 2022-08-02 NOTE — ED Provider Triage Note (Signed)
Emergency Medicine Provider Triage Evaluation Note  Novis Kempe , a 42 y.o. female  was evaluated in triage.  Pt complains of chest pain, shortness of breath. Reports sternal chest pain, non exertional, associated with Shob. Endorses some diarrhea that began this morning. Hx of CAD, stents. Took NTG w/o relief.  Review of Systems  Positive: Chest pain, shob Negative: Nausea, vomiting  Physical Exam  LMP 07/18/2022 (Approximate)  Gen:   Awake, no distress   Resp:  Normal effort  MSK:   Moves extremities without difficulty  Other:     Medical Decision Making  Medically screening exam initiated at 8:12 PM.  Appropriate orders placed.  Soo Cohill was informed that the remainder of the evaluation will be completed by another provider, this initial triage assessment does not replace that evaluation, and the importance of remaining in the ED until their evaluation is complete.  Workup initiated in triage    Dorien Chihuahua 08/02/22 2012

## 2022-08-02 NOTE — Assessment & Plan Note (Addendum)
Known history of extensive CAD.  Endorsing chest pain during recent hospital admission on 3/21.  Troponins elevated.  LHC performed 3/25 and was notable for two-vessel occlusive disease.  Medical management recommended as there were no suitable targets for revascularization.  Denies chest pain since hospital discharge. -No medication changes today.  Continue Plavix/warfarin, atorvastatin, metoprolol, and ranolazine -Cardiology follow-up scheduled for next month

## 2022-08-02 NOTE — Assessment & Plan Note (Signed)
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  Followed by neurology.  Remains on warfarin, which was reversed during recent hospital admission in the setting of amputation of the fifth metatarsal on 3/20.  Warfarin resumed postoperatively.  She is currently being bridged with Lovenox until INR achieves therapeutic goal. -Coumadin clinic follow-up scheduled for tomorrow (3/28). -Currently has an appointment with hematology at [facility] for a second opinion in late May.

## 2022-08-02 NOTE — Assessment & Plan Note (Signed)
Followed by endocrinology.  A1c 7.0 earlier this month.  Endocrinology follow-up scheduled for June.

## 2022-08-02 NOTE — ED Notes (Signed)
PA provider at triage notified on elevated Troponin result.

## 2022-08-02 NOTE — Assessment & Plan Note (Signed)
Recent hospital admission in the setting of osteomyelitis, ultimately requiring amputation of the fifth metatarsal of the left foot on 3/20.  Biopsy of the fourth metatarsal did not show any active infection.  She was discharged on Augmentin and doxycycline.  Currently wearing a boot on her left foot. -ID and podiatry follow-up scheduled for next week.

## 2022-08-03 ENCOUNTER — Encounter (HOSPITAL_COMMUNITY): Payer: Self-pay | Admitting: Internal Medicine

## 2022-08-03 ENCOUNTER — Inpatient Hospital Stay: Payer: BC Managed Care – PPO | Admitting: Internal Medicine

## 2022-08-03 ENCOUNTER — Other Ambulatory Visit: Payer: Self-pay

## 2022-08-03 ENCOUNTER — Ambulatory Visit: Payer: BC Managed Care – PPO | Admitting: Internal Medicine

## 2022-08-03 ENCOUNTER — Emergency Department (HOSPITAL_COMMUNITY): Payer: BC Managed Care – PPO

## 2022-08-03 ENCOUNTER — Ambulatory Visit (HOSPITAL_COMMUNITY): Payer: BC Managed Care – PPO

## 2022-08-03 DIAGNOSIS — D631 Anemia in chronic kidney disease: Secondary | ICD-10-CM | POA: Diagnosis present

## 2022-08-03 DIAGNOSIS — R0782 Intercostal pain: Secondary | ICD-10-CM | POA: Diagnosis not present

## 2022-08-03 DIAGNOSIS — R197 Diarrhea, unspecified: Secondary | ICD-10-CM | POA: Diagnosis present

## 2022-08-03 DIAGNOSIS — I10 Essential (primary) hypertension: Secondary | ICD-10-CM | POA: Diagnosis not present

## 2022-08-03 DIAGNOSIS — Z794 Long term (current) use of insulin: Secondary | ICD-10-CM | POA: Diagnosis not present

## 2022-08-03 DIAGNOSIS — Z1152 Encounter for screening for COVID-19: Secondary | ICD-10-CM | POA: Diagnosis not present

## 2022-08-03 DIAGNOSIS — E782 Mixed hyperlipidemia: Secondary | ICD-10-CM | POA: Diagnosis present

## 2022-08-03 DIAGNOSIS — I2510 Atherosclerotic heart disease of native coronary artery without angina pectoris: Secondary | ICD-10-CM | POA: Diagnosis present

## 2022-08-03 DIAGNOSIS — I214 Non-ST elevation (NSTEMI) myocardial infarction: Secondary | ICD-10-CM

## 2022-08-03 DIAGNOSIS — E1069 Type 1 diabetes mellitus with other specified complication: Secondary | ICD-10-CM | POA: Diagnosis present

## 2022-08-03 DIAGNOSIS — Z79890 Hormone replacement therapy (postmenopausal): Secondary | ICD-10-CM | POA: Diagnosis not present

## 2022-08-03 DIAGNOSIS — Z66 Do not resuscitate: Secondary | ICD-10-CM | POA: Diagnosis present

## 2022-08-03 DIAGNOSIS — M868X7 Other osteomyelitis, ankle and foot: Secondary | ICD-10-CM | POA: Diagnosis present

## 2022-08-03 DIAGNOSIS — I5022 Chronic systolic (congestive) heart failure: Secondary | ICD-10-CM | POA: Diagnosis present

## 2022-08-03 DIAGNOSIS — I21A1 Myocardial infarction type 2: Secondary | ICD-10-CM | POA: Diagnosis present

## 2022-08-03 DIAGNOSIS — I25118 Atherosclerotic heart disease of native coronary artery with other forms of angina pectoris: Secondary | ICD-10-CM | POA: Diagnosis not present

## 2022-08-03 DIAGNOSIS — N184 Chronic kidney disease, stage 4 (severe): Secondary | ICD-10-CM | POA: Diagnosis present

## 2022-08-03 DIAGNOSIS — E1051 Type 1 diabetes mellitus with diabetic peripheral angiopathy without gangrene: Secondary | ICD-10-CM | POA: Diagnosis present

## 2022-08-03 DIAGNOSIS — G8929 Other chronic pain: Secondary | ICD-10-CM | POA: Diagnosis present

## 2022-08-03 DIAGNOSIS — I252 Old myocardial infarction: Secondary | ICD-10-CM | POA: Diagnosis not present

## 2022-08-03 DIAGNOSIS — Z79899 Other long term (current) drug therapy: Secondary | ICD-10-CM | POA: Diagnosis not present

## 2022-08-03 DIAGNOSIS — Z881 Allergy status to other antibiotic agents status: Secondary | ICD-10-CM | POA: Diagnosis not present

## 2022-08-03 DIAGNOSIS — R079 Chest pain, unspecified: Secondary | ICD-10-CM | POA: Diagnosis present

## 2022-08-03 DIAGNOSIS — R7989 Other specified abnormal findings of blood chemistry: Secondary | ICD-10-CM | POA: Diagnosis not present

## 2022-08-03 DIAGNOSIS — I69354 Hemiplegia and hemiparesis following cerebral infarction affecting left non-dominant side: Secondary | ICD-10-CM | POA: Diagnosis not present

## 2022-08-03 DIAGNOSIS — E1022 Type 1 diabetes mellitus with diabetic chronic kidney disease: Secondary | ICD-10-CM | POA: Diagnosis present

## 2022-08-03 DIAGNOSIS — E039 Hypothyroidism, unspecified: Secondary | ICD-10-CM | POA: Diagnosis present

## 2022-08-03 DIAGNOSIS — E1040 Type 1 diabetes mellitus with diabetic neuropathy, unspecified: Secondary | ICD-10-CM | POA: Diagnosis present

## 2022-08-03 DIAGNOSIS — I130 Hypertensive heart and chronic kidney disease with heart failure and stage 1 through stage 4 chronic kidney disease, or unspecified chronic kidney disease: Secondary | ICD-10-CM | POA: Diagnosis present

## 2022-08-03 DIAGNOSIS — E6609 Other obesity due to excess calories: Secondary | ICD-10-CM | POA: Diagnosis present

## 2022-08-03 LAB — GASTROINTESTINAL PANEL BY PCR, STOOL (REPLACES STOOL CULTURE)

## 2022-08-03 LAB — RESP PANEL BY RT-PCR (RSV, FLU A&B, COVID)  RVPGX2
Influenza A by PCR: NEGATIVE
Influenza B by PCR: NEGATIVE
Resp Syncytial Virus by PCR: NEGATIVE
SARS Coronavirus 2 by RT PCR: NEGATIVE

## 2022-08-03 LAB — PROTIME-INR
INR: 2 — ABNORMAL HIGH (ref 0.8–1.2)
Prothrombin Time: 22.5 seconds — ABNORMAL HIGH (ref 11.4–15.2)

## 2022-08-03 LAB — C DIFFICILE QUICK SCREEN W PCR REFLEX
C Diff antigen: NEGATIVE
C Diff interpretation: NOT DETECTED
C Diff toxin: NEGATIVE

## 2022-08-03 LAB — TROPONIN I (HIGH SENSITIVITY)
Troponin I (High Sensitivity): 304 ng/L (ref <18)
Troponin I (High Sensitivity): 234 ng/L (ref <18)

## 2022-08-03 LAB — GLUCOSE, CAPILLARY
Glucose-Capillary: 182 mg/dL — ABNORMAL HIGH (ref 70–99)
Glucose-Capillary: 125 mg/dL — ABNORMAL HIGH (ref 70–99)

## 2022-08-03 MED ORDER — ACETAMINOPHEN 325 MG PO TABS
650.0000 mg | ORAL_TABLET | Freq: Four times a day (QID) | ORAL | Status: DC | PRN
  Administered 2022-08-04 (×3): 650 mg via ORAL
  Filled 2022-08-03 (×3): qty 2

## 2022-08-03 MED ORDER — DOXYCYCLINE HYCLATE 100 MG PO TABS
100.0000 mg | ORAL_TABLET | Freq: Two times a day (BID) | ORAL | Status: DC
  Administered 2022-08-03 – 2022-08-05 (×5): 100 mg via ORAL
  Filled 2022-08-03 (×5): qty 1

## 2022-08-03 MED ORDER — AMOXICILLIN-POT CLAVULANATE 500-125 MG PO TABS
1.0000 | ORAL_TABLET | Freq: Two times a day (BID) | ORAL | Status: DC
  Administered 2022-08-03 – 2022-08-05 (×5): 1 via ORAL
  Filled 2022-08-03 (×5): qty 1

## 2022-08-03 MED ORDER — ISOSORBIDE MONONITRATE 20 MG PO TABS
20.0000 mg | ORAL_TABLET | Freq: Two times a day (BID) | ORAL | Status: DC
  Administered 2022-08-03: 20 mg via ORAL
  Filled 2022-08-03 (×2): qty 1

## 2022-08-03 MED ORDER — NITROGLYCERIN IN D5W 200-5 MCG/ML-% IV SOLN
0.0000 ug/min | INTRAVENOUS | Status: DC
  Administered 2022-08-03: 5 ug/min via INTRAVENOUS
  Administered 2022-08-04: 10 ug/min via INTRAVENOUS
  Filled 2022-08-03: qty 250

## 2022-08-03 MED ORDER — WARFARIN SODIUM 5 MG PO TABS
5.0000 mg | ORAL_TABLET | Freq: Once | ORAL | Status: AC
  Administered 2022-08-03: 5 mg via ORAL
  Filled 2022-08-03: qty 1

## 2022-08-03 MED ORDER — RANOLAZINE ER 500 MG PO TB12
1000.0000 mg | ORAL_TABLET | Freq: Two times a day (BID) | ORAL | Status: DC
  Administered 2022-08-03 – 2022-08-05 (×4): 1000 mg via ORAL
  Filled 2022-08-03 (×4): qty 2

## 2022-08-03 MED ORDER — LACTATED RINGERS IV SOLN: INTRAVENOUS | Status: DC

## 2022-08-03 MED ORDER — MORPHINE SULFATE (PF) 2 MG/ML IV SOLN
2.0000 mg | INTRAVENOUS | Status: DC | PRN
  Administered 2022-08-03: 2 mg via INTRAVENOUS
  Filled 2022-08-03: qty 1

## 2022-08-03 MED ORDER — RANOLAZINE ER 500 MG PO TB12
500.0000 mg | ORAL_TABLET | Freq: Two times a day (BID) | ORAL | Status: DC
  Administered 2022-08-03: 500 mg via ORAL
  Filled 2022-08-03: qty 1

## 2022-08-03 MED ORDER — OXYCODONE HCL 5 MG PO TABS
5.0000 mg | ORAL_TABLET | Freq: Four times a day (QID) | ORAL | Status: DC | PRN
  Administered 2022-08-03 – 2022-08-04 (×3): 5 mg via ORAL
  Filled 2022-08-03 (×3): qty 1

## 2022-08-03 MED ORDER — SODIUM CHLORIDE 0.9% FLUSH
3.0000 mL | Freq: Two times a day (BID) | INTRAVENOUS | Status: DC
  Administered 2022-08-03 – 2022-08-05 (×4): 3 mL via INTRAVENOUS

## 2022-08-03 MED ORDER — METOPROLOL SUCCINATE ER 100 MG PO TB24
100.0000 mg | ORAL_TABLET | Freq: Every day | ORAL | Status: DC
  Administered 2022-08-04 – 2022-08-05 (×2): 100 mg via ORAL
  Filled 2022-08-03 (×2): qty 1

## 2022-08-03 MED ORDER — ONDANSETRON HCL 4 MG PO TABS: 4.0000 mg | ORAL_TABLET | Freq: Four times a day (QID) | ORAL | Status: DC | PRN

## 2022-08-03 MED ORDER — LEVOTHYROXINE SODIUM 25 MCG PO TABS
125.0000 ug | ORAL_TABLET | Freq: Every day | ORAL | Status: DC
  Administered 2022-08-03 – 2022-08-05 (×3): 125 ug via ORAL
  Filled 2022-08-03 (×3): qty 1

## 2022-08-03 MED ORDER — DIPHENHYDRAMINE HCL 50 MG/ML IJ SOLN
25.0000 mg | Freq: Once | INTRAMUSCULAR | Status: AC
  Administered 2022-08-03: 25 mg via INTRAVENOUS
  Filled 2022-08-03: qty 1

## 2022-08-03 MED ORDER — HYDRALAZINE HCL 25 MG PO TABS
12.5000 mg | ORAL_TABLET | Freq: Two times a day (BID) | ORAL | Status: DC
  Administered 2022-08-03 – 2022-08-05 (×5): 12.5 mg via ORAL
  Filled 2022-08-03 (×5): qty 1

## 2022-08-03 MED ORDER — TIZANIDINE HCL 4 MG PO TABS
4.0000 mg | ORAL_TABLET | Freq: Every day | ORAL | Status: DC
  Administered 2022-08-03 – 2022-08-04 (×2): 4 mg via ORAL
  Filled 2022-08-03 (×2): qty 1

## 2022-08-03 MED ORDER — CLOPIDOGREL BISULFATE 75 MG PO TABS
75.0000 mg | ORAL_TABLET | Freq: Every day | ORAL | Status: DC
  Administered 2022-08-03 – 2022-08-05 (×3): 75 mg via ORAL
  Filled 2022-08-03 (×3): qty 1

## 2022-08-03 MED ORDER — HEPARIN (PORCINE) 25000 UT/250ML-% IV SOLN
1500.0000 [IU]/h | INTRAVENOUS | Status: DC
  Administered 2022-08-03: 1200 [IU]/h via INTRAVENOUS
  Administered 2022-08-04: 1300 [IU]/h via INTRAVENOUS
  Filled 2022-08-03 (×2): qty 250

## 2022-08-03 MED ORDER — PROCHLORPERAZINE EDISYLATE 10 MG/2ML IJ SOLN
10.0000 mg | Freq: Once | INTRAMUSCULAR | Status: AC
  Administered 2022-08-03: 10 mg via INTRAVENOUS
  Filled 2022-08-03: qty 2

## 2022-08-03 MED ORDER — METOPROLOL SUCCINATE ER 50 MG PO TB24
50.0000 mg | ORAL_TABLET | Freq: Every day | ORAL | Status: DC
  Administered 2022-08-03: 50 mg via ORAL
  Filled 2022-08-03: qty 2

## 2022-08-03 MED ORDER — ATORVASTATIN CALCIUM 40 MG PO TABS
40.0000 mg | ORAL_TABLET | Freq: Every day | ORAL | Status: DC
  Administered 2022-08-03 – 2022-08-05 (×3): 40 mg via ORAL
  Filled 2022-08-03 (×3): qty 1

## 2022-08-03 MED ORDER — ONDANSETRON HCL 4 MG/2ML IJ SOLN
4.0000 mg | Freq: Four times a day (QID) | INTRAMUSCULAR | Status: DC | PRN
  Administered 2022-08-04: 4 mg via INTRAVENOUS
  Filled 2022-08-03: qty 2

## 2022-08-03 MED ORDER — NITROGLYCERIN 0.4 MG SL SUBL
0.4000 mg | SUBLINGUAL_TABLET | SUBLINGUAL | Status: DC | PRN
  Administered 2022-08-03 (×3): 0.4 mg via SUBLINGUAL
  Filled 2022-08-03 (×2): qty 1

## 2022-08-03 MED ORDER — SACCHAROMYCES BOULARDII 250 MG PO CAPS
250.0000 mg | ORAL_CAPSULE | Freq: Two times a day (BID) | ORAL | Status: DC
  Administered 2022-08-03 – 2022-08-05 (×4): 250 mg via ORAL
  Filled 2022-08-03 (×5): qty 1

## 2022-08-03 MED ORDER — ACETAMINOPHEN 650 MG RE SUPP: 650.0000 mg | Freq: Four times a day (QID) | RECTAL | Status: DC | PRN

## 2022-08-03 MED ORDER — WARFARIN - PHARMACIST DOSING INPATIENT: Freq: Every day | Status: DC

## 2022-08-03 MED ORDER — INSULIN ASPART 100 UNIT/ML IJ SOLN: 0.0000 [IU] | Freq: Every day | INTRAMUSCULAR | Status: DC

## 2022-08-03 MED ORDER — HYDRALAZINE HCL 20 MG/ML IJ SOLN: 5.0000 mg | INTRAMUSCULAR | Status: DC | PRN

## 2022-08-03 MED ORDER — TRAZODONE HCL 100 MG PO TABS
100.0000 mg | ORAL_TABLET | Freq: Every day | ORAL | Status: DC
  Administered 2022-08-03 – 2022-08-04 (×2): 100 mg via ORAL
  Filled 2022-08-03 (×2): qty 1

## 2022-08-03 MED ORDER — INSULIN ASPART 100 UNIT/ML IJ SOLN: 0.0000 [IU] | Freq: Three times a day (TID) | INTRAMUSCULAR | Status: DC

## 2022-08-03 MED ORDER — INSULIN PUMP
Freq: Three times a day (TID) | SUBCUTANEOUS | Status: DC
  Administered 2022-08-04: 8.9 via SUBCUTANEOUS
  Administered 2022-08-04: 6 via SUBCUTANEOUS
  Administered 2022-08-05: 1.4 via SUBCUTANEOUS
  Filled 2022-08-03: qty 1

## 2022-08-03 NOTE — Progress Notes (Signed)
ANTICOAGULATION CONSULT NOTE - Initial Consult  Pharmacy Consult for warfarin Indication:  Prior CVA  , VTE   Allergies  Allergen Reactions   Visipaque [Iodixanol] Nausea And Vomiting    Patient had vagal response during procedure became hypertensive, and bradycardic. CO2 used during procedure prior to contrast being used, this may be cause as well. Recommended premedicating prior to contrast being used in the future.    Wellbutrin [Bupropion] Hives   Cefepime Rash    Tolerates penicilllin   Ciprofloxacin Hcl Hives and Rash    Hives/rash at injection site; 01/15/22 tolerated IV cipro   Tape Rash    Patient Measurements: Height: 5\' 5"  (165.1 cm) Weight: 107.8 kg (237 lb 9.6 oz) IBW/kg (Calculated) : 57  Vital Signs: Temp: 98.6 F (37 C) (04/02 1009) Temp Source: Oral (04/02 1009) BP: 162/88 (04/02 0745) Pulse Rate: 109 (04/02 0937)  Labs: Recent Labs    08/02/22 0757 08/02/22 2023 08/02/22 2235  HGB  --  9.0*  --   HCT  --  29.9*  --   PLT  --  518*  --   LABPROT  --   --  22.8*  INR 3.0  --  2.0*  CREATININE  --  2.57*  --   TROPONINIHS  --  387* 395*    Estimated Creatinine Clearance: 35.5 mL/min (A) (by C-G formula based on SCr of 2.57 mg/dL (H)).   Medical History: Past Medical History:  Diagnosis Date   Anemia    CAD (coronary artery disease)    a. s/p cath in 03/2014 showing 30% mid-LAD, moderate to severe disease along small D1, patent LCx, moderate to severe distal OM2 stenosis and moderate diffuse diease along RCA not amenable to PCI   CHF (congestive heart failure)    a. EF 55-60% in 12/2019 b. EF at 35-40% by echo in 05/2020   CKD (chronic kidney disease) stage 4, GFR 15-29 ml/min    Diabetes mellitus without complication    Myocardial infarction    Neuropathy    PVD (peripheral vascular disease)    Stroke 01/2022   L hand weakness    Medications:  (Not in a hospital admission)  Scheduled:   amoxicillin-clavulanate  1 tablet Oral BID    atorvastatin  40 mg Oral Daily   clopidogrel  75 mg Oral Daily   doxycycline  100 mg Oral Q12H   hydrALAZINE  12.5 mg Oral BID   insulin aspart  0-15 Units Subcutaneous TID WC   insulin aspart  0-5 Units Subcutaneous QHS   isosorbide mononitrate  20 mg Oral BID   levothyroxine  125 mcg Oral Q0600   metoprolol succinate  50 mg Oral Daily   ranolazine  500 mg Oral BID   saccharomyces boulardii  250 mg Oral BID   sodium chloride flush  3 mL Intravenous Q12H   tiZANidine  4 mg Oral QHS   traZODone  100 mg Oral QHS    Assessment: 42 YO female admitted for chest pain. She was recently admitted on 07/19/22 with osteomyelitis and chest pain--LHC performed 3/23 and revealed CAD that is not amendable to intervention. She has a history of CVA from September, 2023 and is on warfarin chronically. Her home warfarin regimen is 5mg  everyday except 7.5mg  on Wednesday and Saturday. Her last dose was 3/31 at 1930.   INR is currently 2, on the lower end of therapeutic range. Hgb 9, plts 518 on CBC from 4/1 PM. No signs/symptoms of bleeding noted. The  patient has been on doxycyline since 3/26 and this could interact with warfarin and increase the INR. The patient remains on doxycycline while inpatient. Pharmacy will continue to monitor.   Goal of Therapy:  INR 2-3 Monitor platelets by anticoagulation protocol: Yes   Plan:  Give warfarin 5mg  x1 tonight  Monitor INR, s/sx of bleeding daily    Billey Gosling, PharmD PGY1 Pharmacy Resident 4/2/202410:12 AM

## 2022-08-03 NOTE — ED Notes (Signed)
Pt ambulated to restroom with steady gait.

## 2022-08-03 NOTE — Progress Notes (Signed)
ANTICOAGULATION CONSULT NOTE - Initial Consult  Pharmacy Consult for heparin Indication: chest pain/ACS  Allergies  Allergen Reactions   Visipaque [Iodixanol] Nausea And Vomiting    Patient had vagal response during procedure became hypertensive, and bradycardic. CO2 used during procedure prior to contrast being used, this may be cause as well. Recommended premedicating prior to contrast being used in the future.    Wellbutrin [Bupropion] Hives   Cefepime Rash    Tolerates penicilllin   Ciprofloxacin Hcl Hives and Rash    Hives/rash at injection site; 01/15/22 tolerated IV cipro   Tape Rash    Patient Measurements: Height: 5\' 5"  (165.1 cm) Weight: 100.2 kg (220 lb 14.4 oz) IBW/kg (Calculated) : 57 Heparin Dosing Weight: 80kg  Vital Signs: Temp: 98.3 F (36.8 C) (04/02 1247) Temp Source: Oral (04/02 1247) BP: 139/85 (04/02 1912) Pulse Rate: 112 (04/02 1247)  Labs: Recent Labs    08/02/22 0757 08/02/22 2023 08/02/22 2235 08/03/22 1046  HGB  --  9.0*  --   --   HCT  --  29.9*  --   --   PLT  --  518*  --   --   LABPROT  --   --  22.8* 22.5*  INR 3.0  --  2.0* 2.0*  CREATININE  --  2.57*  --   --   TROPONINIHS  --  387* 395*  --     Estimated Creatinine Clearance: 34.1 mL/min (A) (by C-G formula based on SCr of 2.57 mg/dL (H)).   Medical History: Past Medical History:  Diagnosis Date   Anemia    CAD (coronary artery disease)    a. s/p cath in 03/2014 showing 30% mid-LAD, moderate to severe disease along small D1, patent LCx, moderate to severe distal OM2 stenosis and moderate diffuse diease along RCA not amenable to PCI   CHF (congestive heart failure)    a. EF 55-60% in 12/2019 b. EF at 35-40% by echo in 05/2020   CKD (chronic kidney disease) stage 4, GFR 15-29 ml/min    Diabetes mellitus without complication    Myocardial infarction    Neuropathy    PVD (peripheral vascular disease)    Stroke 01/2022   L hand weakness     Assessment: 42 yo W on warfarin  for CVA with admit INR of 2.0. Patient admitted for chest pain unresponsive to nitroglycerin likely due to NSTEMI in setting of diarrhea. Pharmacy consulted for heparin.    Discussed with team who would like to start heparin despite INR of 2.0 and received 5mg  warfarin today 4/2 given the severity of her chest pain. Will reassess in AM. In March, patient was therapeutic on [id number] units/hr.   Goal of Therapy:  Heparin level 0.3-0.7 units/ml Monitor platelets by anticoagulation protocol: Yes   Plan:  Start heparin 1200 units/hr, no bolus Monitor daily INR,heparin level, CBC Monitor for signs/symptoms of bleeding  F/u cardiology plans  Benetta Spar, PharmD, BCPS, [place] Clinical Pharmacist  Please check AMION for all [place] phone numbers After 10:00 PM, call [place] [phone]

## 2022-08-03 NOTE — Progress Notes (Signed)
   Brief afternoon note.  Patient admitted overnight by hospitalist service seen by the fellow.  She came in with strange conversion symptoms apparently has been having chest pain but also some nausea vomiting due to pain medications. She is relatively stable all day today but then this evening started having chest discomfort.  Her troponin levels are less elevated than last hospitalization so I do not suspect she is having any new infarct per se but she clearly has severe multivessel disease with no PCI options with occluded LAD and 6 occluded RCA.  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  At this point we are limited to medical management of her pain.  As she is having recurrent pain tonight saying she is having send 10 pain looks relatively comfortable we will go ahead and switch her from oral nitrates to IV nitroglycerin infusion tonight.  Have increased Ranexa from 500 mg twice daily to 1000 mg twice daily.  Will run heparin despite having an INR of 2.0, without a bolus overnight in case there is some small vessel occlusion to see if we get her pain-free.  I suspect that we also may need to consider the possibility of some diastolic failure related chest pain.  Will continue to follow-up tomorrow for rounding note.    Plan tonite: Switch from p.o. titrate to IV nitroglycerin for pain relief Increase Ranexa to 1000 mg twice daily IV heparin overnight, stop warfarin for now (understand that the INR 2.0 and she did receive warfarin tonight, we will hold it to allow for INR to drift down) Would use narcotics as necessary for pain relief as well.  We may eventually need to consider long-term narcotic therapy.    Glenetta Hew, MD

## 2022-08-03 NOTE — Inpatient Diabetes Management (Signed)
Inpatient Diabetes Program Recommendations  AACE/ADA: New Consensus Statement on Inpatient Glycemic Control (2015)  Target Ranges:  Prepandial:   less than 140 mg/dL      Peak postprandial:   less than 180 mg/dL (1-2 hours)      Critically ill patients:  140 - 180 mg/dL   Lab Results  Component Value Date   GLUCAP 269 (H) 07/27/2022   HGBA1C 7.0 (H) 07/19/2022    Review of Glycemic Control  Latest Reference Range & Units 08/02/22 20:23  Glucose 70 - 99 mg/dL 203 (H)  (H): Data is abnormally high Diabetes history: Type 1 DM Outpatient Diabetes medications:  Omnipod- Goal blood sugar 120 mg/dL , Carbohydrate ratio 1 unit/8 grams of CHO, Active insulin time: 3 hours Per Whitney with [facility]-last settings on 06/17/22 Basal rate 12a-12p = 0.9 units/hr--total 24 hr basal 21.6 units  Current orders for Inpatient glycemic control: Novolog 0-15 units TID & HS  Consider adding insulin pump order set.    Spoke with patient and husband at bedside. Insulin pump currently applied to patient and infusing.  Recently saw outpatient endocrinology 06/17/22. No additional changes made to insulin pump settings since that visit. See pump settings above; pump set in auto mode. Pod due to be changed on Thursday. Husband at bedside and willing to get additional supplies if needed. Patient and husband have no additional questions at this time.  Orders updated.   NURSING: Once insulin pump order set is ordered please print off the Patient insulin pump contract and flow sheet. The insulin pump contract should be signed by the patient and then placed in the chart. The patient insulin pump flow sheet will be completed by the patient at the bedside and the RN caring for the patient will use the patient's flow sheet to document in the [facility]. RN will need to complete the Nursing Insulin Pump Flowsheet at least once a shift. Patient will need to keep extra insulin pump supplies at the bedside at all  times.   Thanks, Bronson Curb, MSN, RNC-OB Diabetes Coordinator [phone] (8a-5p)

## 2022-08-03 NOTE — ED Notes (Signed)
Pt still awaiting lunch tray, pt asked to hit call bell when tray arrives, call bell within reach, family member at bedside, no needs per pt at this time

## 2022-08-03 NOTE — ED Notes (Signed)
ED TO INPATIENT HANDOFF REPORT  ED Nurse Name and Phone #: Ivin Poot Name/Age/Gender Lauren [name] 42 y.o. female Room/Bed: 005C/005C  Code Status   Code Status: DNR  Home/SNF/Other Home Patient oriented to: self, place, time, and situation Is this baseline? Yes   Triage Complete: Triage complete  Chief Complaint Chest pain [R07.9]  Triage Note Patient arrived from home reports central chest pain with SOB , diarrhea and lightheaded today , she took 2 NTG sl with no relief , history of CAD /Stents , her cardiologist is Dr. Harl Bowie.    Allergies Allergies  Allergen Reactions   Visipaque [Iodixanol] Nausea And Vomiting    Patient had vagal response during procedure became hypertensive, and bradycardic. CO2 used during procedure prior to contrast being used, this may be cause as well. Recommended premedicating prior to contrast being used in the future.    Wellbutrin [Bupropion] Hives   Cefepime Rash    Tolerates penicilllin   Ciprofloxacin Hcl Hives and Rash    Hives/rash at injection site; 01/15/22 tolerated IV cipro   Tape Rash    Level of Care/Admitting Diagnosis ED Disposition     ED Disposition  Admit   Condition  --   Comment  Hospital Area: [place] [[id number]]  Level of Care: Telemetry Cardiac [[id number]]  May place patient in observation at [facility] or [place] if equivalent level of care is available:: No  Covid Evaluation: Confirmed COVID Negative  Diagnosis: Chest pain [id number]  Admitting Physician: Karmen Bongo [[id number]]  Attending Physician: Karmen Bongo [[id number]]          B Medical/Surgery History Past Medical History:  Diagnosis Date   Anemia    CAD (coronary artery disease)    a. s/p cath in 03/2014 showing 30% mid-LAD, moderate to severe disease along small D1, patent LCx, moderate to severe distal OM2 stenosis and moderate diffuse diease along RCA not amenable to PCI   CHF (congestive heart failure)    a. EF  55-60% in 12/2019 b. EF at 35-40% by echo in 05/2020   CKD (chronic kidney disease) stage 4, GFR 15-29 ml/min    Diabetes mellitus without complication    Myocardial infarction    Neuropathy    PVD (peripheral vascular disease)    Stroke 01/2022   L hand weakness   Past Surgical History:  Procedure Laterality Date   ABDOMINAL AORTOGRAM W/LOWER EXTREMITY N/A 06/10/2022   Procedure: ABDOMINAL AORTOGRAM W/LOWER EXTREMITY;  Surgeon: Marty Heck, MD;  Location: [place] [facility];  Service: Cardiovascular;  Laterality: N/A;   AMPUTATION Left 09/02/2021   Procedure: AMPUTATION RAY;  Surgeon: Criselda Peaches, DPM;  Location: [place];  Service: Podiatry;  Laterality: Left;  sagittal saw, 3L bag saline & Pulse   Cardiac catherization     CHOLECYSTECTOMY     I & D EXTREMITY Left 08/30/2021   Procedure: IRRIGATION AND DEBRIDEMENT WITH BONE BIOPSY;  Surgeon: [name], DPM;  Location: [place];  Service: Podiatry;  Laterality: Left;   IR ANGIO VERTEBRAL SEL SUBCLAVIAN INNOMINATE UNI L MOD SED  01/18/2022   IR CT HEAD LTD  01/08/2022   IR INTRA CRAN STENT  01/08/2022   IR PERCUTANEOUS ART THROMBECTOMY/INFUSION INTRACRANIAL INC DIAG ANGIO  01/08/2022   IRRIGATION AND DEBRIDEMENT FOOT Left 09/04/2021   Procedure: IRRIGATION AND DEBRIDEMENT FOOT AND CLOSURE;  Surgeon: Criselda Peaches, DPM;  Location: [place];  Service: Podiatry;  Laterality: Left;   LEFT HEART  CATH AND CORONARY ANGIOGRAPHY N/A 07/26/2022   Procedure: LEFT HEART CATH AND CORONARY ANGIOGRAPHY;  Surgeon: Martinique, Peter M, MD;  Location: [place] [facility];  Service: Cardiovascular;  Laterality: N/A;   METATARSAL OSTEOTOMY Left 07/21/2022   Procedure: LEFT FOOT EXCISION OF FIFTH METATARSAL WITH DEBRIDEMENT OF ULCER, POSSIBLE BONE BIOPSY OF THE FOURTH METATARSAL, POSSIBLE FOURTH RAY AMPUTATION OF LEFT TOEAND METATARSAL;  Surgeon: Trula Slade, DPM;  Location: [place];  Service: Podiatry;  Laterality: Left;   PERIPHERAL VASCULAR BALLOON  ANGIOPLASTY Left 06/10/2022   Procedure: PERIPHERAL VASCULAR BALLOON ANGIOPLASTY;  Surgeon: Marty Heck, MD;  Location: [place] [facility];  Service: Cardiovascular;  Laterality: Left;  AT and DP   PERIPHERAL VASCULAR INTERVENTION Left 06/10/2022   Procedure: PERIPHERAL VASCULAR INTERVENTION;  Surgeon: Marty Heck, MD;  Location: [facility] [place] [facility];  Service: Cardiovascular;  Laterality: Left;  SFA   RADIOLOGY WITH ANESTHESIA N/A 01/08/2022   Procedure: IR WITH ANESTHESIA;  Surgeon: Radiologist, Medication, MD;  Location: [place];  Service: Radiology;  Laterality: N/A;   THROMBECTOMY BRACHIAL ARTERY Right 11/18/2021   Procedure: RIGHT BRACHIAL, RADIAL, & ULNAR ARTERY THROMBECTOMY.;  Surgeon: Marty Heck, MD;  Location: [place];  Service: Vascular;  Laterality: Right;   TUBAL LIGATION       A IV Location/Drains/Wounds Patient Lines/Drains/Airways Status     Active Line/Drains/Airways     Name Placement date Placement time Site Days   Peripheral IV 08/02/22 20 G Right Antecubital 08/02/22  2232  Antecubital  1   Wound / Incision (Open or Dehisced) 01/28/22 Irritant Dermatitis (Moisture Associated Skin Damage) Sacrum Posterior;Upper slightly red, little skin breakdown 01/28/22  1653  Sacrum  187   Wound / Incision (Open or Dehisced) Other (Comment) Foot Left;Posterior --  --  Foot  --            Intake/Output Last 24 hours No intake or output data in the 24 hours ending 08/03/22 1201  Labs/Imaging Results for orders placed or performed during the hospital encounter of 08/02/22 (from the past 48 hour(s))  Resp panel by RT-PCR (RSV, Flu A&B, Covid) Anterior Nasal Swab     Status: None   Collection Time: 08/02/22  8:12 PM   Specimen: Anterior Nasal Swab  Result Value Ref Range   SARS Coronavirus 2 by RT PCR NEGATIVE NEGATIVE   Influenza A by PCR NEGATIVE NEGATIVE   Influenza B by PCR NEGATIVE NEGATIVE    Comment: (NOTE) The Xpert Xpress SARS-CoV-2/FLU/RSV plus assay  is intended as an aid in the diagnosis of influenza from Nasopharyngeal swab specimens and should not be used as a sole basis for treatment. Nasal washings and aspirates are unacceptable for Xpert Xpress SARS-CoV-2/FLU/RSV testing.  Fact Sheet for Patients: EntrepreneurPulse.com.au  Fact Sheet for Healthcare Providers: IncredibleEmployment.be  This test is not yet approved or cleared by the [place] FDA and has been authorized for detection and/or diagnosis of SARS-CoV-2 by FDA under an Emergency Use Authorization (EUA). This EUA will remain in effect (meaning this test can be used) for the duration of the COVID-19 declaration under Section 564(b)(1) of the Act, 21 U.S.C. section 360bbb-3(b)(1), unless the authorization is terminated or revoked.     Resp Syncytial Virus by PCR NEGATIVE NEGATIVE    Comment: (NOTE) Fact Sheet for Patients: EntrepreneurPulse.com.au  Fact Sheet for Healthcare Providers: IncredibleEmployment.be  This test is not yet approved or cleared by the [place] FDA and has been authorized for detection and/or diagnosis  of SARS-CoV-2 by FDA under an Emergency Use Authorization (EUA). This EUA will remain in effect (meaning this test can be used) for the duration of the COVID-19 declaration under Section 564(b)(1) of the Act, 21 U.S.C. section 360bbb-3(b)(1), unless the authorization is terminated or revoked.  Performed at [facility], [place] [address], [place], [place] [id number]   Basic metabolic panel     Status: Abnormal   Collection Time: 08/02/22  8:23 PM  Result Value Ref Range   Sodium 140 135 - 145 mmol/L   Potassium 4.4 3.5 - 5.1 mmol/L   Chloride 108 98 - 111 mmol/L   CO2 22 22 - 32 mmol/L   Glucose, Bld 203 (H) 70 - 99 mg/dL    Comment: Glucose reference range applies only to samples taken after fasting for at least 8 hours.   BUN 30 (H) 6 - 20 mg/dL    Creatinine, Ser 2.57 (H) 0.44 - 1.00 mg/dL   Calcium 8.1 (L) 8.9 - 10.3 mg/dL   GFR, Estimated 24 (L) >60 mL/min    Comment: (NOTE) Calculated using the CKD-EPI Creatinine Equation (2021)    Anion gap 10 5 - 15    Comment: Performed at [facility] [address] [place], [place] [address]  CBC     Status: Abnormal   Collection Time: 08/02/22  8:23 PM  Result Value Ref Range   WBC 10.2 4.0 - 10.5 K/uL   RBC 3.25 (L) 3.87 - 5.11 MIL/uL   Hemoglobin 9.0 (L) 12.0 - 15.0 g/dL   HCT 29.9 (L) 36.0 - 46.0 %   MCV 92.0 80.0 - 100.0 fL   MCH 27.7 26.0 - 34.0 pg   MCHC 30.1 30.0 - 36.0 g/dL   RDW 14.3 11.5 - 15.5 %   Platelets 518 (H) 150 - 400 K/uL   nRBC 0.0 0.0 - 0.2 %    Comment: Performed at [facility], [place] [address], [place], [place] [address]  Troponin I (High Sensitivity)     Status: Abnormal   Collection Time: 08/02/22  8:23 PM  Result Value Ref Range   Troponin I (High Sensitivity) 387 (HH) <18 ng/L    Comment: CRITICAL RESULT CALLED TO, READ BACK BY AND VERIFIED WITH B. SINGALANG RN 08/02/22 @2135  BY J. WHITE (NOTE) Elevated high sensitivity troponin I (hsTnI) values and significant  changes across serial measurements may suggest ACS but many other  chronic and acute conditions are known to elevate hsTnI results.  Refer to the "Links" section for chest pain algorithms and additional  guidance. Performed at [facility], [place] [address], [place], [place] [address]   Brain natriuretic peptide     Status: Abnormal   Collection Time: 08/02/22  8:23 PM  Result Value Ref Range   B Natriuretic Peptide 869.3 (H) 0.0 - 100.0 pg/mL    Comment: Performed at [facility] [address] [place], [place] [address]  I-Stat beta hCG blood, ED     Status: None   Collection Time: 08/02/22  8:46 PM  Result Value Ref Range   I-stat hCG, quantitative <5.0 <5 mIU/mL   Comment 3            Comment:   GEST. AGE      CONC.  (mIU/mL)   <=1 WEEK        5 - 50     2  WEEKS       50 - 500  3 WEEKS       100 - 10,000     4 WEEKS     1,000 - 30,000        FEMALE AND NON-PREGNANT FEMALE:     LESS THAN 5 mIU/mL   Troponin I (High Sensitivity)     Status: Abnormal   Collection Time: 08/02/22 10:35 PM  Result Value Ref Range   Troponin I (High Sensitivity) 395 (HH) <18 ng/L    Comment: CRITICAL VALUE NOTED. VALUE IS CONSISTENT WITH PREVIOUSLY REPORTED/CALLED VALUE (NOTE) Elevated high sensitivity troponin I (hsTnI) values and significant  changes across serial measurements may suggest ACS but many other  chronic and acute conditions are known to elevate hsTnI results.  Refer to the "Links" section for chest pain algorithms and additional  guidance. Performed at [facility], [place] [address], [place], [place] [address]   Hepatic function panel     Status: Abnormal   Collection Time: 08/02/22 10:35 PM  Result Value Ref Range   Total Protein 6.2 (L) 6.5 - 8.1 g/dL   Albumin 2.4 (L) 3.5 - 5.0 g/dL   AST 25 15 - 41 U/L   ALT 22 0 - 44 U/L   Alkaline Phosphatase 95 38 - 126 U/L   Total Bilirubin 0.3 0.3 - 1.2 mg/dL   Bilirubin, Direct 0.1 0.0 - 0.2 mg/dL   Indirect Bilirubin 0.2 (L) 0.3 - 0.9 mg/dL    Comment: Performed at [facility] [address] [place], [place] [address]  Protime-INR     Status: Abnormal   Collection Time: 08/02/22 10:35 PM  Result Value Ref Range   Prothrombin Time 22.8 (H) 11.4 - 15.2 seconds   INR 2.0 (H) 0.8 - 1.2    Comment: (NOTE) INR goal varies based on device and disease states. Performed at [facility], [place] [address], [place], [place] [address]   C Difficile Quick Screen w PCR reflex     Status: None   Collection Time: 08/03/22 12:22 AM   Specimen: STOOL  Result Value Ref Range   C Diff antigen NEGATIVE NEGATIVE   C Diff toxin NEGATIVE NEGATIVE   C Diff interpretation No C. difficile detected.     Comment: Performed at [facility], [place] [address] [place], [place] [address]   Gastrointestinal Panel by PCR , Stool     Status: None   Collection Time: 08/03/22 12:22 AM   Specimen: STOOL  Result Value Ref Range   Campylobacter species NOT DETECTED NOT DETECTED   Plesimonas shigelloides NOT DETECTED NOT DETECTED   Salmonella species NOT DETECTED NOT DETECTED   Yersinia enterocolitica NOT DETECTED NOT DETECTED   Vibrio species NOT DETECTED NOT DETECTED   Vibrio cholerae NOT DETECTED NOT DETECTED   Enteroaggregative E coli (EAEC) NOT DETECTED NOT DETECTED   Enteropathogenic E coli (EPEC) NOT DETECTED NOT DETECTED   Enterotoxigenic E coli (ETEC) NOT DETECTED NOT DETECTED   Shiga like toxin producing E coli (STEC) NOT DETECTED NOT DETECTED   Shigella/Enteroinvasive E coli (EIEC) NOT DETECTED NOT DETECTED   Cryptosporidium NOT DETECTED NOT DETECTED   Cyclospora cayetanensis NOT DETECTED NOT DETECTED   Entamoeba histolytica NOT DETECTED NOT DETECTED   Giardia lamblia NOT DETECTED NOT DETECTED   Adenovirus F40/41 NOT DETECTED NOT DETECTED   Astrovirus NOT DETECTED NOT DETECTED   Norovirus GI/GII NOT DETECTED NOT DETECTED   Rotavirus A NOT DETECTED NOT DETECTED   Sapovirus (I, II, IV, and V)  NOT DETECTED NOT DETECTED    Comment: Performed at [facility], [place] [place], [place] [address]   CT ABDOMEN PELVIS WO CONTRAST  Result Date: 08/03/2022 CLINICAL DATA:  42 year old female with history of diarrhea. Chest pain and shortness of breath. EXAM: CT ABDOMEN AND PELVIS WITHOUT CONTRAST TECHNIQUE: Multidetector CT imaging of the abdomen and pelvis was performed following the standard protocol without IV contrast. RADIATION DOSE REDUCTION: This exam was performed according to the departmental dose-optimization program which includes automated exposure control, adjustment of the mA and/or kV according to patient size and/or use of iterative reconstruction technique. COMPARISON:  CT the abdomen and pelvis 06/23/2014. FINDINGS: Lower chest: Patchy areas of  ground-glass attenuation and interlobular septal thickening noted throughout the visualize lung bases, favored to reflect some mild interstitial pulmonary edema. Mild cardiomegaly with mild left ventricular dilatation. Low-attenuation of the intravascular compartment, concerning for anemia. Atherosclerotic calcifications in the left circumflex and right coronary arteries. Multiple small pulmonary nodules noted in the periphery of the visualized lung bases bilaterally, generally 5 mm or less in size and stable compared to the prior study. The exception to this is a 9 x 11 mm right lower lobe pulmonary nodule (axial image 7 of series 5) which previously measured 7 x 6 mm. Hepatobiliary: No suspicious cystic or solid hepatic lesions are confidently identified on today's noncontrast CT examination. Status post cholecystectomy. Pancreas: No definite pancreatic mass or peripancreatic fluid collections or inflammatory changes are noted on today's noncontrast CT examination. Spleen: Unremarkable. Adrenals/Urinary Tract: Multiple tiny 1-4 mm calcifications associated with both renal hila appear to be vascular, although nonobstructive calculi are difficult to entirely exclude. No calcifications are noted along the course of either ureter or within the lumen of the urinary bladder. No hydroureteronephrosis. Unenhanced appearance of the kidneys and bilateral adrenal glands is otherwise normal. Urinary bladder is moderately distended but otherwise unremarkable in appearance. Stomach/Bowel: Unenhanced appearance of the stomach is normal. No pathologic dilatation of small bowel or colon. The appendix is normal in size without surrounding inflammatory changes. Multiple small appendicoliths are incidentally noted. Vascular/Lymphatic: Atherosclerosis of the abdominal aorta and pelvic vasculature. No lymphadenopathy noted in the abdomen or pelvis. Reproductive: Unenhanced appearance of the uterus and ovaries is unremarkable. Other: No  significant volume of ascites.  No pneumoperitoneum. Musculoskeletal: Numerous rounded areas of soft tissue prominence and architectural distortion are noted in the subcutaneous fat of the anterior abdominal wall bilaterally, likely injection granulomas. There are no aggressive appearing lytic or blastic lesions noted in the visualized portions of the skeleton. IMPRESSION: 1. No definite acute findings are noted in the abdomen or pelvis to account for the patient's symptoms. 2. Multiple pulmonary nodules generally stable compared to the prior examination, with exception of an enlarging 9 x 11 mm macrolobulated nodule in the posterior aspect of the right lower lobe. Further evaluation with follow-up nonemergent chest CT is recommended in the near future to better evaluate the full extent of nodularity in the lungs and establish a baseline for follow-up examinations, as the possibility of neoplasm is not excluded. 3. Low attenuation of the intravascular compartment concerning for anemia. 4. The appearance of the lung bases is concerning for mild interstitial pulmonary edema. Given the cardiomegaly and mild left ventricular dilatation, clinical correlation for signs and symptoms of congestive heart failure is recommended. 5. Aortic atherosclerosis, in addition to at least 2 vessel coronary artery disease. Please note that although the presence of coronary artery calcium documents the presence of coronary  artery disease, the severity of this disease and any potential stenosis cannot be assessed on this non-gated CT examination. Assessment for potential risk factor modification, dietary therapy or pharmacologic therapy may be warranted, if clinically indicated. 6. Additional incidental findings, as above. Electronically Signed   By: Vinnie Langton M.D.   On: 08/03/2022 06:36   DG Chest 2 View  Result Date: 08/02/2022 CLINICAL DATA:  [place], chest pain EXAM: CHEST - 2 VIEW COMPARISON:  07/19/2022 FINDINGS: The heart  size and mediastinal contours are within normal limits. There is diffuse pulmonary interstitial prominence which could be seen with atypical infection, edema or interstitial lung disease. There is no focal consolidation. No pneumothorax or pleural effusion. The visualized osseous structures are unremarkable. IMPRESSION: Nonspecific interstitial prominence consistent with atypical infection, edema or interstitial lung disease. No focal consolidation. Electronically Signed   By: Sammie Bench M.D.   On: 08/02/2022 20:39    Pending Labs Unresulted Labs (From admission, onward)     Start     Ordered   08/04/22 [id number]  Basic metabolic panel  Tomorrow morning,   R        08/03/22 0938   08/04/22 0500  CBC  Tomorrow morning,   R        08/03/22 0938   08/03/22 1130  Protime-INR  Once-Timed,   TIMED        08/03/22 1026            Vitals/Pain Today's Vitals   08/03/22 0937 08/03/22 1009 08/03/22 1015 08/03/22 1154  BP:   (!) 155/108   Pulse: (!) 109  (!) 116   Resp: 10  11   Temp:  98.6 F (37 C)    TempSrc:  Oral    SpO2: 100%  100%   Weight:      Height:      PainSc:    7     Isolation Precautions Enteric precautions (UV disinfection)  Medications Medications  oxyCODONE (Oxy IR/ROXICODONE) immediate release tablet 5 mg (5 mg Oral Given 08/03/22 1035)  amoxicillin-clavulanate (AUGMENTIN) 500-125 MG per tablet 1 tablet (1 tablet Oral Given 08/03/22 1035)  doxycycline (VIBRA-TABS) tablet 100 mg (100 mg Oral Given 08/03/22 1035)  atorvastatin (LIPITOR) tablet 40 mg (40 mg Oral Given 08/03/22 1035)  hydrALAZINE (APRESOLINE) tablet 12.5 mg (12.5 mg Oral Given 08/03/22 1035)  isosorbide mononitrate (ISMO) tablet 20 mg (20 mg Oral Given 08/03/22 1035)  metoprolol succinate (TOPROL-XL) 24 hr tablet 50 mg (50 mg Oral Given 08/03/22 1035)  ranolazine (RANEXA) 12 hr tablet 500 mg (500 mg Oral Given 08/03/22 1035)  nitroGLYCERIN (NITROSTAT) SL tablet 0.4 mg (has no administration in time range)   traZODone (DESYREL) tablet 100 mg (has no administration in time range)  levothyroxine (SYNTHROID) tablet 125 mcg (125 mcg Oral Given 08/03/22 1035)  clopidogrel (PLAVIX) tablet 75 mg (75 mg Oral Given 08/03/22 1035)  tiZANidine (ZANAFLEX) tablet 4 mg (has no administration in time range)  sodium chloride flush (NS) 0.9 % injection 3 mL (3 mLs Intravenous Not Given 08/03/22 1047)  lactated ringers infusion ( Intravenous New Bag/Given 08/03/22 1040)  acetaminophen (TYLENOL) tablet 650 mg (has no administration in time range)    Or  acetaminophen (TYLENOL) suppository 650 mg (has no administration in time range)  morphine (PF) 2 MG/ML injection 2 mg (2 mg Intravenous Given 08/03/22 1153)  ondansetron (ZOFRAN) tablet 4 mg (has no administration in time range)    Or  ondansetron (ZOFRAN) injection 4 mg (has no  administration in time range)  hydrALAZINE (APRESOLINE) injection 5 mg (has no administration in time range)  saccharomyces boulardii (FLORASTOR) capsule 250 mg (has no administration in time range)  insulin pump (has no administration in time range)  morphine (PF) 2 MG/ML injection 2 mg (2 mg Intravenous Given 08/03/22 0028)  prochlorperazine (COMPAZINE) injection 10 mg (10 mg Intravenous Given 08/03/22 0123)  diphenhydrAMINE (BENADRYL) injection 25 mg (25 mg Intravenous Given 08/03/22 0123)    Mobility walks     Focused Assessments gastrointestinal   R Recommendations: See Admitting Provider Note  Report given to:   Additional Notes:

## 2022-08-03 NOTE — Progress Notes (Signed)
Patient with chest pain just prior to 1900. Nitro x3 given with no relief. EKG completed and primary and cardiology notified. Cardiology to increase some medication dosing and initiate heparin gtt as well as a continuous nitro gtt to help relieve chest pain. Hand off given to night shift RN and charge RN

## 2022-08-03 NOTE — Consult Note (Signed)
Cardiology Consultation   Patient ID: Lauren [name] MRN: [id number]; DOB: Sep 02, 1980  Admit date: 08/02/2022 Date of Consult: 08/03/2022  PCP:  Johnette Abraham, [place] [facility] Cardiologist:  Carlyle Dolly, MD        Patient Profile:   Lauren [name] is a 42 y.o. female with a hx of CVA, severe 2v CAD c/b recent NSTEMI, CKD4, osteomyelitis who is being seen 08/03/2022 for the evaluation of CP at the request of ED.  History of Present Illness:   Lauren [name] is a 42 y.o. female with a hx of CVA s/p thrombectomy 01/2022, PAD s/p left anterior tibial/dorsalis pedis angioplasty, stenting of left mid SFA on 06/10/22, severe 2v CAD c/b recent NSTEMI, CKD4, osteomyelitis who is being seen 08/03/2022 for the evaluation of CP at the request of ED.  Recent admission 07/19/22-07/27/22 for NSTEMI (hs-trops in the 1200s) in the setting of diabetic foot osteomyelitis s/p left fifth metatarsal excision on 07/21/22. Eventually got coronary angiography 07/26/22 which showed mLAD 100% stenosis, mRCA 95% stenosis with 100% distal stenosis. No good targets for revascularization so decision was made for medical management of her severe CAD. She was eventually discharged with PO antibiotics for her osteomyelitis. She notes that since her discharge on 07/27/22, she was CP free until this morning when she developed CP while at rest. She took 2 SLNG but that did not help with her CP. That scared her since it was similar to the pain she felt during her last admission so she decided to present to the ED for evaluation.  In the ED, she was also endorsing nausea, vomiting, and watery diarrhea that has been going on for the last few days. HR in the 100s, BP 140s/80s. Hs-trops 387 -> 395 (down from 1200s on 07/22/22). She just had an episode of nausea that required compazine, now feeling better. Currently denying CP. No orthopnea, PND, or BLE edema.  Past Medical History:  Diagnosis Date   Anemia     CAD (coronary artery disease)    a. s/p cath in 03/2014 showing 30% mid-LAD, moderate to severe disease along small D1, patent LCx, moderate to severe distal OM2 stenosis and moderate diffuse diease along RCA not amenable to PCI   CHF (congestive heart failure)    a. EF 55-60% in 12/2019 b. EF at 35-40% by echo in 05/2020   CKD (chronic kidney disease) stage 4, GFR 15-29 ml/min    Diabetes mellitus without complication    Myocardial infarction    Neuropathy    PVD (peripheral vascular disease)    Stroke 01/2022   L hand weakness    Past Surgical History:  Procedure Laterality Date   ABDOMINAL AORTOGRAM W/LOWER EXTREMITY N/A 06/10/2022   Procedure: ABDOMINAL AORTOGRAM W/LOWER EXTREMITY;  Surgeon: Marty Heck, MD;  Location: [facility];  Service: Cardiovascular;  Laterality: N/A;   AMPUTATION Left 09/02/2021   Procedure: AMPUTATION RAY;  Surgeon: Criselda Peaches, DPM;  Location: [facility];  Service: Podiatry;  Laterality: Left;  sagittal saw, 3L bag saline & Pulse   Cardiac catherization     CHOLECYSTECTOMY     I & D EXTREMITY Left 08/30/2021   Procedure: IRRIGATION AND DEBRIDEMENT WITH BONE BIOPSY;  Surgeon: [name], DPM;  Location: [facility];  Service: Podiatry;  Laterality: Left;   IR ANGIO VERTEBRAL SEL SUBCLAVIAN INNOMINATE UNI L MOD SED  01/18/2022   IR CT HEAD LTD  01/08/2022   IR INTRA CRAN STENT  01/08/2022   IR PERCUTANEOUS ART THROMBECTOMY/INFUSION INTRACRANIAL INC DIAG ANGIO  01/08/2022   IRRIGATION AND DEBRIDEMENT FOOT Left 09/04/2021   Procedure: IRRIGATION AND DEBRIDEMENT FOOT AND CLOSURE;  Surgeon: Criselda Peaches, DPM;  Location: [facility];  Service: Podiatry;  Laterality: Left;   LEFT HEART CATH AND CORONARY ANGIOGRAPHY N/A 07/26/2022   Procedure: LEFT HEART CATH AND CORONARY ANGIOGRAPHY;  Surgeon: Martinique, Peter M, MD;  Location: [facility];  Service: Cardiovascular;  Laterality: N/A;   METATARSAL OSTEOTOMY Left 07/21/2022   Procedure: LEFT FOOT EXCISION OF FIFTH  METATARSAL WITH DEBRIDEMENT OF ULCER, POSSIBLE BONE BIOPSY OF THE FOURTH METATARSAL, POSSIBLE FOURTH RAY AMPUTATION OF LEFT TOEAND METATARSAL;  Surgeon: Trula Slade, DPM;  Location: [facility];  Service: Podiatry;  Laterality: Left;   PERIPHERAL VASCULAR BALLOON ANGIOPLASTY Left 06/10/2022   Procedure: PERIPHERAL VASCULAR BALLOON ANGIOPLASTY;  Surgeon: Marty Heck, MD;  Location: [facility];  Service: Cardiovascular;  Laterality: Left;  AT and DP   PERIPHERAL VASCULAR INTERVENTION Left 06/10/2022   Procedure: PERIPHERAL VASCULAR INTERVENTION;  Surgeon: Marty Heck, MD;  Location: [facility];  Service: Cardiovascular;  Laterality: Left;  SFA   RADIOLOGY WITH ANESTHESIA N/A 01/08/2022   Procedure: IR WITH ANESTHESIA;  Surgeon: Radiologist, Medication, MD;  Location: [facility];  Service: Radiology;  Laterality: N/A;   THROMBECTOMY BRACHIAL ARTERY Right 11/18/2021   Procedure: RIGHT BRACHIAL, RADIAL, & ULNAR ARTERY THROMBECTOMY.;  Surgeon: Marty Heck, MD;  Location: [facility];  Service: Vascular;  Laterality: Right;   TUBAL LIGATION       Home Medications:  Prior to Admission medications   Medication Sig Start Date End Date Taking? Authorizing Provider  acetaminophen (TYLENOL) 325 MG tablet Take 1-2 tablets (325-650 mg total) by mouth every 4 (four) hours as needed for mild pain. Patient taking differently: Take 650 mg by mouth as needed for mild pain. 02/18/22   Love, Ivan Anchors, PA-C  amoxicillin-clavulanate (AUGMENTIN) 500-125 MG tablet Take 1 tablet by mouth 2 (two) times daily for 25 days. 07/27/22 08/21/22  Thurnell Lose, MD  atorvastatin (LIPITOR) 40 MG tablet Take 1 tablet (40 mg total) by mouth daily. Courtesy fill/ pt to get established with a primary MD for further refills 06/01/22 11/28/22  Johnette Abraham, MD  clopidogrel (PLAVIX) 75 MG tablet Take 1 tablet (75 mg total) by mouth daily. 04/29/22   Garvin Fila, MD  Continuous Blood Gluc Sensor (DEXCOM G6  SENSOR) MISC Change sensor every 10 days as directed 09/29/21   Brita Romp, NP  diclofenac Sodium (VOLTAREN ARTHRITIS PAIN) 1 % GEL Apply 2 g topically 4 (four) times daily. Patient taking differently: Apply 1 Application topically 2 (two) times daily as needed (pain). 04/19/22   Jennye Boroughs, MD  doxycycline (VIBRA-TABS) 100 MG tablet Take 1 tablet (100 mg total) by mouth every 12 (twelve) hours for 25 days. 07/27/22 08/21/22  Thurnell Lose, MD  enoxaparin (LOVENOX) 100 MG/ML injection Inject 90 mg (0.9 mL) twice daily, Stop once INR reaches 2.  Follow-up with your PCP within 2 to 3 days for INR check. 07/27/22   Thurnell Lose, MD  furosemide (LASIX) 40 MG tablet Take 40 mg daily as needed for swelling 03/05/22   Arnoldo Lenis, MD  hydrALAZINE (APRESOLINE) 25 MG tablet Take 0.5 tablets (12.5 mg total) by mouth 2 (two) times daily. 05/10/22   Arnoldo Lenis, MD  insulin aspart (NOVOLOG) 100 UNIT/ML injection Use with  Omnipod for daily dose around 80 units daily 06/17/22   Brita Romp, NP  Insulin Disposable Pump (OMNIPOD 5 G6 PODS, GEN 5,) MISC Change pod every 48-72 hours 06/17/22   Brita Romp, NP  Insulin Disposable Pump (OMNIPOD 5 G6 PODS, GEN 5,) MISC Change pod every 48-72 hours 07/07/22   Brita Romp, NP  isosorbide mononitrate (ISMO) 20 MG tablet Take 1 tablet (20 mg total) by mouth 2 (two) times daily. Courtesy fill/ pt to get established with a primary MD for further refills 06/01/22 11/28/22  Johnette Abraham, MD  levothyroxine (SYNTHROID) 125 MCG tablet Take 1 tablet (125 mcg total) by mouth daily at 6 (six) AM. 06/17/22 06/12/23  Brita Romp, NP  magnesium oxide (MAG-OX) 400 MG tablet Take 0.5 tablets (200 mg total) by mouth at bedtime. Patient taking differently: Take 400 mg by mouth 3 (three) times a week. 03/12/22   Benay Pike, MD  metoprolol succinate (TOPROL XL) 50 MG 24 hr tablet Take 1 tablet (50 mg total) by mouth daily. 05/10/22    Arnoldo Lenis, MD  Multiple Vitamin (MULTIVITAMIN WITH MINERALS) TABS tablet Take 1 tablet by mouth daily. 02/18/22   Love, Ivan Anchors, PA-C  nitroGLYCERIN (NITROSTAT) 0.4 MG SL tablet Place 1 tablet (0.4 mg total) under the tongue every 5 (five) minutes x 3 doses as needed for chest pain. 02/18/22   Love, Ivan Anchors, PA-C  ondansetron (ZOFRAN) 4 MG tablet Take 1 tablet (4 mg total) by mouth every 8 (eight) hours as needed for nausea or vomiting. 03/01/22   Lovorn, Jinny Blossom, MD  oxyCODONE (OXY IR/ROXICODONE) 5 MG immediate release tablet Take 1 tablet (5 mg total) by mouth every 6 (six) hours as needed for severe pain. 07/08/22   McDonald, Stephan Minister, DPM  pantoprazole (PROTONIX) 40 MG tablet Take 1 tablet (40 mg total) by mouth daily. Courtesy fill/ pt to get established with a primary MD for further refills Patient taking differently: Take 40 mg by mouth daily as needed (reflux). 06/01/22 11/28/22  Johnette Abraham, MD  polyethylene glycol (MIRALAX / GLYCOLAX) 17 g packet Take 17 g by mouth 2 (two) times daily. Patient taking differently: Take 17 g by mouth as needed for moderate constipation. 03/03/22   Bayard Hugger, NP  prochlorperazine (COMPAZINE) 10 MG tablet Take 1 tablet (10 mg total) by mouth every 6 (six) hours as needed for nausea or vomiting. 07/28/22   Johnette Abraham, MD  ranolazine (RANEXA) 500 MG 12 hr tablet Take 1 tablet (500 mg total) by mouth 2 (two) times daily. Courtesy fill/ pt to get established with a primary MD for further refills 06/01/22 08/30/22  Johnette Abraham, MD  SANTYL 250 UNIT/GM ointment Apply 1 Application topically daily. 05/31/22   [provider]  tiZANidine (ZANAFLEX) 4 MG tablet Take 1 tablet (4 mg total) by mouth at bedtime. Courtesy fill/ pt to get established with a primary MD for further refills 06/01/22   Johnette Abraham, MD  traZODone (DESYREL) 100 MG tablet Take 1 tablet (100 mg total) by mouth at bedtime. 07/28/22   Johnette Abraham, MD  warfarin  (COUMADIN) 5 MG tablet TAKE ONE TABLET BY MOUTH DAILY EXCEPT 1 AND 1/2 TABLET ON WEDNESDAYS AND SATURDAYS OR AS DIRECTED Patient taking differently: Take 5-7.5 mg by mouth every evening. TAKE ONE TABLET BY MOUTH DAILY EXCEPT 1 AND 1/2 TABLET ON [facility] AND SATURDAYS OR AS DIRECTED 06/15/22   Arnoldo Lenis, MD  Inpatient Medications: Scheduled Meds:  Continuous Infusions:  PRN Meds:   Allergies:    Allergies  Allergen Reactions   Visipaque [Iodixanol] Nausea And Vomiting    Patient had vagal response during procedure became hypertensive, and bradycardic. CO2 used during procedure prior to contrast being used, this may be cause as well. Recommended premedicating prior to contrast being used in the future.    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  Tobacco Use   Smoking status: Former    Packs/day: 1.00    Years: 15.00    Additional pack years: 0.00    Total pack years: 15.00    Types: Cigarettes    Quit date: 01/2022    Years since quitting: 0.5    Passive exposure: Past   Smokeless tobacco: Never  Vaping Use   Vaping Use: Never used  Substance and Sexual Activity   Alcohol use: Yes    Comment: occ   Drug use: No   Sexual activity: Yes    Birth control/protection: Surgical    Comment: tubal   Other Topics Concern   Not on file  Social History Narrative   Lives with husband and kids   Right handed   Drinks 9+ cups caffeine daily   Social Determinants of Health   Financial Resource Strain: Low Risk   (03/03/2022)   Overall Financial Resource Strain (CARDIA)    Difficulty of Paying Living Expenses: Not very hard  Recent Concern: Financial Resource Strain - High Risk (01/14/2022)   Overall Financial Resource Strain (CARDIA)    Difficulty of Paying Living Expenses: Hard  Food Insecurity: No Food Insecurity (07/28/2022)   Hunger Vital Sign    Worried About Running Out of Food in the Last Year: Never true    Ran Out of Food in the Last Year: Never true  Transportation Needs: No Transportation Needs (07/27/2022)   PRAPARE - Hydrologist (Medical): No    Lack of Transportation (Non-Medical): No  Physical Activity: Inactive (03/03/2022)   Exercise Vital Sign    Days of Exercise per Week: 0 days    Minutes of Exercise per Session: 0 min  Stress: No Stress Concern Present (03/03/2022)   [place]    Feeling of Stress : Only a little  Recent Concern: Stress - Stress Concern Present (12/22/2021)   [place]    Feeling of Stress : To some extent  Social Connections: Socially Integrated (03/03/2022)   Social Connection and Isolation Panel [NHANES]    Frequency of Communication with Friends and Family: More than three times a week    Frequency of Social Gatherings with Friends and Family: More than three times a week    Attends Religious Services: More than 4 times per year    Active Member of [employer] or Organizations: Yes    Attends Archivist Meetings: More  than 4 times per year    Marital Status: Married  Human resources officer Violence: Not At Risk (07/27/2022)   Humiliation, Afraid, Rape, and Kick questionnaire    Fear of Current or Ex-Partner: No    Emotionally Abused: No    Physically Abused: No    Sexually Abused: No    Family History:   Family History  Problem Relation Age of Onset   Thyroid disease Mother    Hypertension  Mother    Hyperlipidemia Mother    CAD Mother    CVA Mother    Hyperlipidemia Father    Hypertension Father    CAD Father    Breast cancer Paternal Grandmother 66   Cancer Paternal Grandmother        lung    ROS:  Please see the history of present illness.  All other ROS reviewed and negative.     Physical Exam/Data:   Vitals:   08/02/22 2300 08/02/22 2313 08/03/22 0000 08/03/22 0045  BP: (!) 149/77   137/73  Pulse: 98   93  Resp: 14   12  Temp:   98.6 F (37 C)   SpO2: 100%   100%  Weight:  107.8 kg    Height:  5\' 5"  (1.651 m)     No intake or output data in the 24 hours ending 08/03/22 0200    08/02/2022   11:13 PM 07/28/2022    3:08 PM 07/19/2022    5:30 AM  Last 3 Weights  Weight (lbs) 237 lb 9.6 oz 237 lb 9.6 oz 195 lb  Weight (kg) 107.775 kg 107.775 kg 88.451 kg     Body mass index is 39.54 kg/m.  General:  Lying in bed in NAD HEENT: normal Neck: no JVD Vascular: No carotid bruits; Distal pulses 2+ bilaterally Cardiac:  normal S1, S2; RRR; no murmur Lungs:  clear to auscultation bilaterally, no wheezing, rhonchi or rales  Abd: soft, nontender, no hepatomegaly, mild tenderness to palpitation diffusely Ext: no edema Musculoskeletal:  No deformities, BUE and BLE strength normal and equal Skin: warm and dry  Neuro:  awake and alert, grossly oriented, moving all extremities Psych:  Normal affect   EKG:  The EKG was personally reviewed and demonstrates:  NSR at 99bpm Telemetry:  Telemetry was personally reviewed and demonstrates:  sinus tachycardia  Relevant CV Studies:  TTE 07/24/22:  1. Left ventricular ejection fraction, by estimation, is 30 to 35%. The  left ventricle has moderately decreased function. The left ventricle  demonstrates regional wall motion abnormalities (see scoring  diagram/findings for description). There is  moderate left ventricular hypertrophy. Left ventricular diastolic  parameters are indeterminate.   2. Right ventricular systolic  function is mildly reduced. The right  ventricular size is normal. There is mildly elevated pulmonary artery  systolic pressure. The estimated right ventricular systolic pressure is  [id number] mmHg.   3. Left atrial size was moderately dilated.   4. The mitral valve is grossly normal. Mild to moderate mitral valve  regurgitation.   5. The aortic valve was not well visualized. Aortic valve regurgitation  is not visualized. Aortic valve mean gradient measures 2.0 mmHg.   6. The inferior vena cava is normal in size with greater than 50%  respiratory variability, suggesting right atrial pressure of 3 mmHg.   Coronary angiography 07/26/22:   Ost LAD to Prox LAD lesion is 75% stenosed.   Prox LAD to Mid LAD lesion is 100% stenosed.   Prox Cx to Dist Cx  lesion is 35% stenosed with 35% stenosed side branch in LPAV.   Prox RCA lesion is 95% stenosed.   Mid RCA lesion is 95% stenosed.   Dist RCA lesion is 100% stenosed.   LV end diastolic pressure is mildly elevated.   2 vessel occlusive CAD. The LAD is occluded after a small diagonal. The RCA is small and diffusely diseased. It is occluded distally Mildly elevated LVEDP   Laboratory Data:  High Sensitivity Troponin:   Recent Labs  Lab 07/22/22 1736 07/22/22 1842 07/22/22 2110 08/02/22 2023 08/02/22 2235  TROPONINIHS 1,198* 1,199* 1,210* 387* 395*     Chemistry Recent Labs  Lab 07/27/22 0548 08/02/22 2023  NA 130* 140  K 5.0 4.4  CL 103 108  CO2 18* 22  GLUCOSE 336* 203*  BUN 46* 30*  CREATININE 2.64* 2.57*  CALCIUM 8.2* 8.1*  MG 2.7*  --   GFRNONAA 23* 24*  ANIONGAP 9 10    Recent Labs  Lab 08/02/22 2235  PROT 6.2*  ALBUMIN 2.4*  AST 25  ALT 22  ALKPHOS 95  BILITOT 0.3   Lipids No results for input(s): "CHOL", "TRIG", "HDL", "LABVLDL", "LDLCALC", "CHOLHDL" in the last 168 hours.  Hematology Recent Labs  Lab 07/27/22 0548 08/02/22 2023  WBC 17.8* 10.2  RBC 3.14* 3.25*  HGB 8.8* 9.0*  HCT 28.0* 29.9*  MCV 89.2  92.0  MCH 28.0 27.7  MCHC 31.4 30.1  RDW 14.3 14.3  PLT 619* 518*   Thyroid No results for input(s): "TSH", "FREET4" in the last 168 hours.  BNP Recent Labs  Lab 07/27/22 0548 08/02/22 2023  BNP 2,632.3* 869.3*    DDimer No results for input(s): "DDIMER" in the last 168 hours.   Radiology/Studies:  DG Chest 2 View  Result Date: 08/02/2022 CLINICAL DATA:  Sob, chest pain EXAM: CHEST - 2 VIEW COMPARISON:  07/19/2022 FINDINGS: The heart size and mediastinal contours are within normal limits. There is diffuse pulmonary interstitial prominence which could be seen with atypical infection, edema or interstitial lung disease. There is no focal consolidation. No pneumothorax or pleural effusion. The visualized osseous structures are unremarkable. IMPRESSION: Nonspecific interstitial prominence consistent with atypical infection, edema or interstitial lung disease. No focal consolidation. Electronically Signed   By: Sammie Bench M.D.   On: 08/02/2022 20:39    MRI foot left 07/19/22: 1. Prior transmetatarsal amputation of the fifth ray. Wound or ulceration at the lateral foot adjacent to the fifth metatarsal base. 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 2. Minimal bone marrow edema of the fourth metatarsal base, nonspecific and may be reactive. Trace fluid in the fourth and fifth tarsometatarsal joints, likely reactive although developing septic arthritis not excluded. 3. Generalized soft tissue edema of the left foot. No organized or drainable fluid collections. 4. Displaced ununited fracture of the distal shaft of the fourth metatarsal without residual bone marrow edema.  Assessment and Plan:   Lauren [name] is a 42 y.o. female with a hx of CVA s/p thrombectomy 01/2022, PAD s/p left anterior tibial/dorsalis pedis angioplasty, stenting of left mid SFA on 06/10/22, severe 2v CAD c/b recent NSTEMI, CKD4, osteomyelitis who is being  seen 08/03/2022 for the evaluation of CP at the request of ED.  #NSTEMI, likely type 2 #Severe 2v CAD without good revascularization targets Known severe 2v CAD with  75% o-pLAD and 100% p-mLAD as well as 95% pRCA and 100% dRCA. Unfortunately without good revascularization targets so now  being medically managed. She is currently tachycardic and hypertensive so would benefit from uptitration of her home antianginals to assist with remaining chest pain free. With her diarrhea and N/V, suspect there may be a component of hypovolemia that may have precipitated this episode. Her elevated troponins are not currently dynamic and lower than her troponins from her last admission--therefore I have lower suspicion for a true plaque rupture event (type 1 NSTEMI) and think her symptoms are more consistent with myocardial oxygen supply-demand mismatching. - Recommend admission to medicine for workup of her diarrhea, N/V, abdominal pain; we will assist with titration of her antianginals - Continue home plavix 75mg  QD and home warfarin - Continue home isosobide mononitrate 20mg  BID, ranolazine 500mg  BID, hydralazine 12.5mg  QD - Please INCREASE home metoprolol succinate 50mg  to 100mg  QD to help with antianginal therapy - She is currently CP-free, we will follow along to uptitrate her home meds should CP return; should her CP return, please give SLNG PRN for CP and let cardiology know [ ]  should she not be able to remain chest pain free despite uptitration of her antianginals, will need to consider high-risk PCI of CTOs vs high-risk CABG  Risk Assessment/Risk Scores:     TIMI Risk Score for Unstable Angina or Non-ST Elevation MI:   The patient's TIMI risk score is 4, which indicates a 20% risk of all cause mortality, new or recurrent myocardial infarction or need for urgent revascularization in the next 14 days.     For questions or updates, please contact [place] Please consult [email or web address] for  contact info under    Signed, Pollie Meyer, MD  08/03/2022 2:00 AM

## 2022-08-03 NOTE — H&P (Signed)
History and Physical    Patient: Lauren [name] [id number] DOB: 10-03-1980 DOA: 08/02/2022 DOS: the patient was seen and examined on 08/03/2022 PCP: Johnette Abraham, MD  Patient coming from: Home - lives with husband, son, daughter; Donald Prose: Husband, [phone]   Chief Complaint: Chest pain  HPI: Lauren [name] is a 42 y.o. female with medical history significant of CAD, stage 4 CKD, chronic systolic CHF, DM, CVA, and obesity presenting with chest pain, diarrhea.  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  She was discharged on Doxy/Augmentin.  She also had CP during that admission and underwent cath on 3/25 and CAD was not amenable to intervention and so was treated medically with Plavix, statin, and Ranexa.  She reports that all was well until last night.  She developed acute onset of chest pain at rest.  Substernal pain, took 2 NTG without relief.  It felt like there was an elephant sitting on her chest.  It improved but has recurred.    She had diarrhea all day yesterday, still going on.  She has had maybe 50 stools since it started.  +n/v as well.  No sick contacts.  She doesn't think she ate anything bad.  She is on 2 antibiotics and thought it was related to that.  She is supposed ot take the antibiotics for 25 days.   ER Course:  Carryover, per Dr. Alcario Drought:  Pt just discharged on 31st with osteo of leg.  Also has 3 vessel dz on cath that they aren't able to intervene on.  Now back in with CP, cards wanted admit.   Also diarrhea, but C.Diff is neg.      Review of Systems: As mentioned in the history of present illness. All other systems reviewed and are negative. Past Medical History:  Diagnosis Date   Anemia    CAD (coronary artery disease)    a. s/p cath in 03/2014 showing 30% mid-LAD, moderate to severe disease along small D1, patent LCx, moderate to severe distal OM2 stenosis and  moderate diffuse diease along RCA not amenable to PCI   CHF (congestive heart failure)    a. EF 55-60% in 12/2019 b. EF at 35-40% by echo in 05/2020   CKD (chronic kidney disease) stage 4, GFR 15-29 ml/min    Diabetes mellitus without complication    Myocardial infarction    Neuropathy    PVD (peripheral vascular disease)    Stroke 01/2022   L hand weakness   Past Surgical History:  Procedure Laterality Date   ABDOMINAL AORTOGRAM W/LOWER EXTREMITY N/A 06/10/2022   Procedure: ABDOMINAL AORTOGRAM W/LOWER EXTREMITY;  Surgeon: Marty Heck, MD;  Location: [facility];  Service: Cardiovascular;  Laterality: N/A;   AMPUTATION Left 09/02/2021   Procedure: AMPUTATION RAY;  Surgeon: Criselda Peaches, DPM;  Location: [facility];  Service: Podiatry;  Laterality: Left;  sagittal saw, 3L bag saline & Pulse   Cardiac catherization     CHOLECYSTECTOMY     I & D EXTREMITY Left 08/30/2021   Procedure: IRRIGATION AND DEBRIDEMENT WITH BONE BIOPSY;  Surgeon: [name], DPM;  Location: [facility];  Service: Podiatry;  Laterality: Left;   IR ANGIO VERTEBRAL SEL SUBCLAVIAN INNOMINATE UNI L MOD SED  01/18/2022   IR CT HEAD LTD  01/08/2022   IR INTRA CRAN STENT  01/08/2022   IR PERCUTANEOUS ART THROMBECTOMY/INFUSION INTRACRANIAL INC DIAG ANGIO  01/08/2022  IRRIGATION AND DEBRIDEMENT FOOT Left 09/04/2021   Procedure: IRRIGATION AND DEBRIDEMENT FOOT AND CLOSURE;  Surgeon: Criselda Peaches, DPM;  Location: [facility];  Service: Podiatry;  Laterality: Left;   LEFT HEART CATH AND CORONARY ANGIOGRAPHY N/A 07/26/2022   Procedure: LEFT HEART CATH AND CORONARY ANGIOGRAPHY;  Surgeon: Martinique, Peter M, MD;  Location: [facility];  Service: Cardiovascular;  Laterality: N/A;   METATARSAL OSTEOTOMY Left 07/21/2022   Procedure: LEFT FOOT EXCISION OF FIFTH METATARSAL WITH DEBRIDEMENT OF ULCER, POSSIBLE BONE BIOPSY OF THE FOURTH METATARSAL, POSSIBLE FOURTH RAY AMPUTATION OF LEFT TOEAND METATARSAL;  Surgeon: Trula Slade, DPM;   Location: [place];  Service: Podiatry;  Laterality: Left;   PERIPHERAL VASCULAR BALLOON ANGIOPLASTY Left 06/10/2022   Procedure: PERIPHERAL VASCULAR BALLOON ANGIOPLASTY;  Surgeon: Marty Heck, MD;  Location: [facility];  Service: Cardiovascular;  Laterality: Left;  AT and DP   PERIPHERAL VASCULAR INTERVENTION Left 06/10/2022   Procedure: PERIPHERAL VASCULAR INTERVENTION;  Surgeon: Marty Heck, MD;  Location: [facility];  Service: Cardiovascular;  Laterality: Left;  SFA   RADIOLOGY WITH ANESTHESIA N/A 01/08/2022   Procedure: IR WITH ANESTHESIA;  Surgeon: Radiologist, Medication, MD;  Location: [place] [facility];  Service: Radiology;  Laterality: N/A;   THROMBECTOMY BRACHIAL ARTERY Right 11/18/2021   Procedure: RIGHT BRACHIAL, RADIAL, & ULNAR ARTERY THROMBECTOMY.;  Surgeon: Marty Heck, MD;  Location: [place];  Service: Vascular;  Laterality: Right;   TUBAL LIGATION     Social History:  reports that she quit smoking about 7 months ago. Her smoking use included cigarettes. She has a 15.00 pack-year smoking history. She has been exposed to tobacco smoke. She has never used smokeless tobacco. She reports current alcohol use. She reports that she does not use drugs.  Allergies  Allergen Reactions   Visipaque [Iodixanol] Nausea And Vomiting    Patient had vagal response during procedure became hypertensive, and bradycardic. CO2 used during procedure prior to contrast being used, this may be cause as well. Recommended premedicating prior to contrast being used in the future.    Wellbutrin [Bupropion] Hives   Cefepime Rash    Tolerates penicilllin   Ciprofloxacin Hcl Hives and Rash    Hives/rash at injection site; 01/15/22 tolerated IV cipro   Tape Rash    Family History  Problem Relation Age of Onset   Thyroid disease Mother    Hypertension Mother    Hyperlipidemia Mother    CAD Mother    CVA Mother    Hyperlipidemia Father    Hypertension Father    CAD Father    Breast  cancer Paternal Grandmother 46   Cancer Paternal Grandmother        lung    Prior to Admission medications   Medication Sig Start Date End Date Taking? Authorizing Provider  acetaminophen (TYLENOL) 325 MG tablet Take 1-2 tablets (325-650 mg total) by mouth every 4 (four) hours as needed for mild pain. Patient taking differently: Take 650 mg by mouth as needed for mild pain. 02/18/22  Yes Love, Ivan Anchors, PA-C  amoxicillin-clavulanate (AUGMENTIN) 500-125 MG tablet Take 1 tablet by mouth 2 (two) times daily for 25 days. 07/27/22 08/21/22 Yes Thurnell Lose, MD  atorvastatin (LIPITOR) 40 MG tablet Take 1 tablet (40 mg total) by mouth daily. Courtesy fill/ pt to get established with a primary MD for further refills 06/01/22 11/28/22 Yes Johnette Abraham, MD  clopidogrel (PLAVIX) 75 MG tablet Take 1 tablet (75 mg  total) by mouth daily. 04/29/22  Yes Garvin Fila, MD  diclofenac Sodium (VOLTAREN ARTHRITIS PAIN) 1 % GEL Apply 2 g topically 4 (four) times daily. Patient taking differently: Apply 1 Application topically 2 (two) times daily as needed (pain). 04/19/22  Yes Jennye Boroughs, MD  doxycycline (VIBRA-TABS) 100 MG tablet Take 1 tablet (100 mg total) by mouth every 12 (twelve) hours for 25 days. 07/27/22 08/21/22 Yes Thurnell Lose, MD  furosemide (LASIX) 40 MG tablet Take 40 mg daily as needed for swelling 03/05/22  Yes Branch, Alphonse Guild, MD  hydrALAZINE (APRESOLINE) 25 MG tablet Take 0.5 tablets (12.5 mg total) by mouth 2 (two) times daily. 05/10/22  Yes BranchAlphonse Guild, MD  isosorbide mononitrate (ISMO) 20 MG tablet Take 1 tablet (20 mg total) by mouth 2 (two) times daily. Courtesy fill/ pt to get established with a primary MD for further refills 06/01/22 11/28/22 Yes Johnette Abraham, MD  levothyroxine (SYNTHROID) 125 MCG tablet Take 1 tablet (125 mcg total) by mouth daily at 6 (six) AM. 06/17/22 06/12/23 Yes Brita Romp, NP  magnesium oxide (MAG-OX) 400 MG tablet Take 0.5 tablets (200  mg total) by mouth at bedtime. Patient taking differently: Take 400 mg by mouth every other day. 03/12/22  Yes Benay Pike, MD  metoprolol succinate (TOPROL XL) 50 MG 24 hr tablet Take 1 tablet (50 mg total) by mouth daily. 05/10/22  Yes BranchAlphonse Guild, MD  Multiple Vitamin (MULTIVITAMIN WITH MINERALS) TABS tablet Take 1 tablet by mouth daily. 02/18/22  Yes Love, Ivan Anchors, PA-C  nitroGLYCERIN (NITROSTAT) 0.4 MG SL tablet Place 1 tablet (0.4 mg total) under the tongue every 5 (five) minutes x 3 doses as needed for chest pain. 02/18/22  Yes Love, Ivan Anchors, PA-C  oxyCODONE (OXY IR/ROXICODONE) 5 MG immediate release tablet Take 1 tablet (5 mg total) by mouth every 6 (six) hours as needed for severe pain. 07/08/22  Yes McDonald, Stephan Minister, DPM  pantoprazole (PROTONIX) 40 MG tablet Take 1 tablet (40 mg total) by mouth daily. Courtesy fill/ pt to get established with a primary MD for further refills Patient taking differently: Take 40 mg by mouth daily as needed (reflux). 06/01/22 11/28/22 Yes Johnette Abraham, MD  polyethylene glycol (MIRALAX / GLYCOLAX) 17 g packet Take 17 g by mouth 2 (two) times daily. Patient taking differently: Take 17 g by mouth as needed for moderate constipation. 03/03/22  Yes Bayard Hugger, NP  ranolazine (RANEXA) 500 MG 12 hr tablet Take 1 tablet (500 mg total) by mouth 2 (two) times daily. Courtesy fill/ pt to get established with a primary MD for further refills 06/01/22 08/30/22 Yes Johnette Abraham, MD  SANTYL 250 UNIT/GM ointment Apply 1 Application topically daily. 05/31/22  Yes [provider]  tiZANidine (ZANAFLEX) 4 MG tablet Take 1 tablet (4 mg total) by mouth at bedtime. Courtesy fill/ pt to get established with a primary MD for further refills 06/01/22  Yes Johnette Abraham, MD  traZODone (DESYREL) 100 MG tablet Take 1 tablet (100 mg total) by mouth at bedtime. 07/28/22  Yes Johnette Abraham, MD  warfarin (COUMADIN) 5 MG tablet TAKE ONE TABLET BY MOUTH DAILY EXCEPT  1 AND 1/2 TABLET ON WEDNESDAYS AND SATURDAYS OR AS DIRECTED Patient taking differently: Take 5-7.5 mg by mouth every evening. TAKE ONE TABLET BY MOUTH DAILY EXCEPT 1 AND 1/2 TABLET ON [facility] AND SATURDAYS OR AS DIRECTED 06/15/22  Yes Branch, Alphonse Guild, MD  Continuous Blood  Gluc Sensor (DEXCOM G6 SENSOR) MISC Change sensor every 10 days as directed 09/29/21   Brita Romp, NP  enoxaparin (LOVENOX) 100 MG/ML injection Inject 90 mg (0.9 mL) twice daily, Stop once INR reaches 2.  Follow-up with your PCP within 2 to 3 days for INR check. Patient not taking: Reported on 08/03/2022 07/27/22   Thurnell Lose, MD  insulin aspart (NOVOLOG) 100 UNIT/ML injection Use with Omnipod for daily dose around 80 units daily 06/17/22   Brita Romp, NP  Insulin Disposable Pump (OMNIPOD 5 G6 PODS, GEN 5,) MISC Change pod every 48-72 hours 06/17/22   Brita Romp, NP  Insulin Disposable Pump (OMNIPOD 5 G6 PODS, GEN 5,) MISC Change pod every 48-72 hours 07/07/22   Brita Romp, NP  prochlorperazine (COMPAZINE) 10 MG tablet Take 1 tablet (10 mg total) by mouth every 6 (six) hours as needed for nausea or vomiting. Patient not taking: Reported on 08/03/2022 07/28/22   Johnette Abraham, MD    Physical Exam: Vitals:   08/03/22 1015 08/03/22 1200 08/03/22 1247 08/03/22 1257  BP: (!) 155/108 (!) 152/94 132/68   Pulse: (!) 116 (!) 109 (!) 112   Resp: 11 12 15    Temp:   98.3 F (36.8 C)   TempSrc:   Oral   SpO2: 100% 100% 100%   Weight:    100.2 kg  Height:    5\' 5"  (1.651 m)   General:  Appears calm and comfortable and is in NAD Eyes:   EOMI, normal lids, iris ENT:  grossly normal hearing, lips & tongue, mmm Neck:  no LAD, masses or thyromegaly Cardiovascular:  RRR, no m/r/g. No LE edema.  Respiratory:   CTA bilaterally with no wheezes/rales/rhonchi.  Normal respiratory effort. Abdomen:  soft, mild diffuse abdominal TTP, ND Skin:  no rash or induration seen on limited exam Musculoskeletal:   grossly normal tone BUE/BLE, good ROM, no bony abnormality; LLE is in CAM walker and patient reports that the wound is healing well, declined direct visualization Psychiatric:  blunted mood and affect, speech fluent and appropriate, AOx3 Neurologic:  CN 2-12 grossly intact, moves all extremities in coordinated fashion   Radiological Exams on Admission: Independently reviewed - see discussion in A/P where applicable  CT ABDOMEN PELVIS WO CONTRAST  Result Date: 08/03/2022 CLINICAL DATA:  41 year old female with history of diarrhea. Chest pain and shortness of breath. EXAM: CT ABDOMEN AND PELVIS WITHOUT CONTRAST TECHNIQUE: Multidetector CT imaging of the abdomen and pelvis was performed following the standard protocol without IV contrast. RADIATION DOSE REDUCTION: This exam was performed according to the departmental dose-optimization program which includes automated exposure control, adjustment of the mA and/or kV according to patient size and/or use of iterative reconstruction technique. COMPARISON:  CT the abdomen and pelvis 06/23/2014. FINDINGS: Lower chest: Patchy areas of ground-glass attenuation and interlobular septal thickening noted throughout the visualize lung bases, favored to reflect some mild interstitial pulmonary edema. Mild cardiomegaly with mild left ventricular dilatation. Low-attenuation of the intravascular compartment, concerning for anemia. Atherosclerotic calcifications in the left circumflex and right coronary arteries. Multiple small pulmonary nodules noted in the periphery of the visualized lung bases bilaterally, generally 5 mm or less in size and stable compared to the prior study. The exception to this is a 9 x 11 mm right lower lobe pulmonary nodule (axial image 7 of series 5) which previously measured 7 x 6 mm. Hepatobiliary: No suspicious cystic or solid hepatic lesions are confidently identified on today's  noncontrast CT examination. Status post cholecystectomy. Pancreas: No  definite pancreatic mass or peripancreatic fluid collections or inflammatory changes are noted on today's noncontrast CT examination. Spleen: Unremarkable. Adrenals/Urinary Tract: Multiple tiny 1-4 mm calcifications associated with both renal hila appear to be vascular, although nonobstructive calculi are difficult to entirely exclude. No calcifications are noted along the course of either ureter or within the lumen of the urinary bladder. No hydroureteronephrosis. Unenhanced appearance of the kidneys and bilateral adrenal glands is otherwise normal. Urinary bladder is moderately distended but otherwise unremarkable in appearance. Stomach/Bowel: Unenhanced appearance of the stomach is normal. No pathologic dilatation of small bowel or colon. The appendix is normal in size without surrounding inflammatory changes. Multiple small appendicoliths are incidentally noted. Vascular/Lymphatic: Atherosclerosis of the abdominal aorta and pelvic vasculature. No lymphadenopathy noted in the abdomen or pelvis. Reproductive: Unenhanced appearance of the uterus and ovaries is unremarkable. Other: No significant volume of ascites.  No pneumoperitoneum. Musculoskeletal: Numerous rounded areas of soft tissue prominence and architectural distortion are noted in the subcutaneous fat of the anterior abdominal wall bilaterally, likely injection granulomas. There are no aggressive appearing lytic or blastic lesions noted in the visualized portions of the skeleton. IMPRESSION: 1. No definite acute findings are noted in the abdomen or pelvis to account for the patient's symptoms. 2. Multiple pulmonary nodules generally stable compared to the prior examination, with exception of an enlarging 9 x 11 mm macrolobulated nodule in the posterior aspect of the right lower lobe. Further evaluation with follow-up nonemergent chest CT is recommended in the near future to better evaluate the full extent of nodularity in the lungs and establish a  baseline for follow-up examinations, as the possibility of neoplasm is not excluded. 3. Low attenuation of the intravascular compartment concerning for anemia. 4. The appearance of the lung bases is concerning for mild interstitial pulmonary edema. Given the cardiomegaly and mild left ventricular dilatation, clinical correlation for signs and symptoms of congestive heart failure is recommended. 5. Aortic atherosclerosis, in addition to at least 2 vessel coronary artery disease. Please note that although the presence of coronary artery calcium documents the presence of coronary artery disease, the severity of this disease and any potential stenosis cannot be assessed on this non-gated CT examination. Assessment for potential risk factor modification, dietary therapy or pharmacologic therapy may be warranted, if clinically indicated. 6. Additional incidental findings, as above. Electronically Signed   By: Vinnie Langton M.D.   On: 08/03/2022 06:36   DG Chest 2 View  Result Date: 08/02/2022 CLINICAL DATA:  Sob, chest pain EXAM: CHEST - 2 VIEW COMPARISON:  07/19/2022 FINDINGS: The heart size and mediastinal contours are within normal limits. There is diffuse pulmonary interstitial prominence which could be seen with atypical infection, edema or interstitial lung disease. There is no focal consolidation. No pneumothorax or pleural effusion. The visualized osseous structures are unremarkable. IMPRESSION: Nonspecific interstitial prominence consistent with atypical infection, edema or interstitial lung disease. No focal consolidation. Electronically Signed   By: Sammie Bench M.D.   On: 08/02/2022 20:39    EKG: Independently reviewed.   2001 - NSR with rate 94; nonspecific ST changes with no evidence of acute ischemia 2158 - NSR with rate 99; nonspecific ST changes with no evidence of acute ischemia   Labs on Admission: I have personally reviewed the available labs and imaging studies at the time of the  admission.  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   -Will admit since the patient has positive troponins with angina  -Cardiology consultation  -Her NSTEMI is likely associated with demand ischemia in the setting of acute onset of diarrhea -Cardiology overnight recommended increasing home Toprol XL to 100 mg daily -Cardiology reports that there are no good targets for intervention and she will need to be medically managed; they will see her again tomorrow  Diarrhea -Patient with acute onset of severe diarrhea + milder n/v -Likely VGE, as C diff and GI pathogen panel are negative -Antibiotic side effect is less likely since she has been taking them for several days and this came on so acutely -Supportive care for now -She was given IVF and Lasix was held but will stop IVF at this time and continue to follow to prevent volume overload  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 -She reports that  it is healing well and declined having my undress it to examine it -Continue Augmentin, Doxy   PVD -Angioplasty with stent placement on 2/8 -Continue Plavix   H/o CVA -L hemiparesis, upper > lower -Underwent thrombectomy for this as well as for RUE ischemia associated with brachial artery embolus -These events were both thought to be embolic in nature and so she was started on Coumadin -Pharmacy to dose Coumadin   DM -Recent A1c was 7.0 -Continue insulin pump -Diabetes coordinator was consulted   Chronic systolic CHF -Appears compensated  -Continue Plavix, Ranexa, Imdur -Hold Lasix   HLD -Continue Lipitor   HTN -Continue hydralazine, Toprol XL (at increased dose)   Hypothyroidism -Continue Synthroid   Tobacco dependence -Not currently smoking, praise provided   Stage 4 CKD -Appears to be stable at this time -Attempt to avoid nephrotoxic medications -Recheck BMP in AM    Chronic pain -I have reviewed this patient in the [place] Controlled Substances Reporting System.  She is receiving medications from only one provider and appears to be taking them as prescribed. -She is not at particularly high risk of opioid misuse, diversion, or overdose.  -Continue Oxy IR, tizanidine   Obesity Body mass index is 36.76 kg/m.  -Weight loss should be encouraged -Outpatient PCP/bariatric medicine f/u encouraged    DNR -MOST form from 01/2022 indicates that patient is DNR -I have discussed code status with the patient and she would not desire resuscitation and would prefer to die a natural death should that situation arise.         Advance Care Planning:   Code Status: DNR   Consults: Cardiology  DVT Prophylaxis: Coumadin  Family Communication: None present; she declined to have me call her husband at the time of admission  Severity of Illness: The appropriate patient status for this patient is INPATIENT. Inpatient status is judged to be reasonable and necessary in order  to provide the required intensity of service to ensure the patient's safety. The patient's presenting symptoms, physical exam findings, and initial radiographic and laboratory data in the context of their chronic comorbidities is felt to place them at high risk for further clinical deterioration. Furthermore, it is not anticipated that the patient will be medically stable for discharge from the hospital within 2 midnights of admission.   * I certify that at the point of admission it is my clinical judgment that the patient will require inpatient hospital care spanning beyond 2 midnights from the point of admission due to high intensity of service, high risk for further deterioration and high frequency of surveillance required.*  Author: Karmen Bongo, MD 08/03/2022 6:24 PM  For on call review [email or web address].

## 2022-08-04 ENCOUNTER — Telehealth: Payer: Self-pay

## 2022-08-04 DIAGNOSIS — E782 Mixed hyperlipidemia: Secondary | ICD-10-CM

## 2022-08-04 DIAGNOSIS — I10 Essential (primary) hypertension: Secondary | ICD-10-CM

## 2022-08-04 DIAGNOSIS — I25118 Atherosclerotic heart disease of native coronary artery with other forms of angina pectoris: Secondary | ICD-10-CM

## 2022-08-04 DIAGNOSIS — I214 Non-ST elevation (NSTEMI) myocardial infarction: Secondary | ICD-10-CM | POA: Diagnosis not present

## 2022-08-04 DIAGNOSIS — R7989 Other specified abnormal findings of blood chemistry: Secondary | ICD-10-CM

## 2022-08-04 LAB — BASIC METABOLIC PANEL
Anion gap: 7 (ref 5–15)
BUN: 23 mg/dL — ABNORMAL HIGH (ref 6–20)
CO2: 22 mmol/L (ref 22–32)
Calcium: 8.1 mg/dL — ABNORMAL LOW (ref 8.9–10.3)
Chloride: 110 mmol/L (ref 98–111)
Creatinine, Ser: 2.13 mg/dL — ABNORMAL HIGH (ref 0.44–1.00)
GFR, Estimated: 29 mL/min — ABNORMAL LOW (ref >60)
Glucose, Bld: 139 mg/dL — ABNORMAL HIGH (ref 70–99)
Potassium: 4.7 mmol/L (ref 3.5–5.1)
Sodium: 139 mmol/L (ref 135–145)

## 2022-08-04 LAB — CBC
HCT: 28.4 % — ABNORMAL LOW (ref 36.0–46.0)
Hemoglobin: 8.9 g/dL — ABNORMAL LOW (ref 12.0–15.0)
MCH: 27.6 pg (ref 26.0–34.0)
MCHC: 31.3 g/dL (ref 30.0–36.0)
MCV: 88.2 fL (ref 80.0–100.0)
Platelets: 493 10*3/uL — ABNORMAL HIGH (ref 150–400)
RBC: 3.22 MIL/uL — ABNORMAL LOW (ref 3.87–5.11)
RDW: 14.3 % (ref 11.5–15.5)
WBC: 11.5 10*3/uL — ABNORMAL HIGH (ref 4.0–10.5)
nRBC: 0 % (ref 0.0–0.2)

## 2022-08-04 LAB — GLUCOSE, CAPILLARY
Glucose-Capillary: 133 mg/dL — ABNORMAL HIGH (ref 70–99)
Glucose-Capillary: 116 mg/dL — ABNORMAL HIGH (ref 70–99)
Glucose-Capillary: 109 mg/dL — ABNORMAL HIGH (ref 70–99)
Glucose-Capillary: 178 mg/dL — ABNORMAL HIGH (ref 70–99)
Glucose-Capillary: 129 mg/dL — ABNORMAL HIGH (ref 70–99)

## 2022-08-04 LAB — PROTIME-INR
INR: 1.6 — ABNORMAL HIGH (ref 0.8–1.2)
Prothrombin Time: 19.3 seconds — ABNORMAL HIGH (ref 11.4–15.2)

## 2022-08-04 LAB — HEPARIN LEVEL (UNFRACTIONATED)
Heparin Unfractionated: 0.23 IU/mL — ABNORMAL LOW (ref 0.30–0.70)
Heparin Unfractionated: <0.1 IU/mL — ABNORMAL LOW (ref 0.30–0.70)

## 2022-08-04 MED ORDER — WARFARIN SODIUM 7.5 MG PO TABS
7.5000 mg | ORAL_TABLET | Freq: Once | ORAL | Status: AC
  Administered 2022-08-04: 7.5 mg via ORAL
  Filled 2022-08-04: qty 1

## 2022-08-04 MED ORDER — ISOSORBIDE MONONITRATE 20 MG PO TABS
40.0000 mg | ORAL_TABLET | Freq: Two times a day (BID) | ORAL | Status: DC
  Administered 2022-08-04 – 2022-08-05 (×2): 40 mg via ORAL
  Filled 2022-08-04 (×3): qty 2

## 2022-08-04 NOTE — TOC Progression Note (Signed)
Transition of Care [facility]) - Progression Note    Patient Details  Name: Lauren [name] MRN: [id number] Date of Birth: Dec 01, 1980  [facility]) CM/SW Contact  Zenon Mayo, RN Phone Number: 08/04/2022, 4:51 PM  Clinical Narrative:    from home, NSTEMI, s/p recent small toe amputation on left foot, bp is elevated. TOC following.        Expected Discharge Plan and Services                                               Social Determinants of Health (SDOH) Interventions SDOH Screenings   Food Insecurity: No Food Insecurity (08/03/2022)  Housing: Low Risk  (08/03/2022)  Transportation Needs: No Transportation Needs (08/03/2022)  Utilities: Not At Risk (08/03/2022)  Alcohol Screen: Low Risk  (03/03/2022)  Depression (PHQ2-9): Low Risk  (07/28/2022)  Financial Resource Strain: Low Risk  (03/03/2022)  Recent Concern: Financial Resource Strain - High Risk (01/14/2022)  Physical Activity: Inactive (03/03/2022)  Social Connections: Socially Integrated (03/03/2022)  Stress: No Stress Concern Present (03/03/2022)  Recent Concern: Stress - Stress Concern Present (12/22/2021)  Tobacco Use: Medium Risk (08/03/2022)    Readmission Risk Interventions     No data to display

## 2022-08-04 NOTE — Consult Note (Signed)
   [facility] CM Inpatient Consult   08/04/2022  [name] 11/02/1980 TO:[id number]  [place] [facility] [ACO] Patient: Lauren [name] [facility]  Primary Care Provider:  Johnette Abraham, MD with [facility]  Patient is currently active with [facility] for chronic disease management services.  Patient has been engaged by a [facility] LCSW listed in the Care Teams.  Our community based plan of care has focused on disease management and community resource support.    Plan: Follow with the Inpatient Transition Of Care [TOC] team member to make aware that [facility] following.   Of note, [facility] Care Management services does not replace or interfere with any services that are needed or arranged by inpatient [facility] care management team.   For additional questions or referrals please contact:  Natividad Brood, RN BSN [place]  [phone] business mobile phone Toll free office [phone]  *[place]  [phone] Fax number: [phone] [email or web address]

## 2022-08-04 NOTE — Progress Notes (Addendum)
ANTICOAGULATION CONSULT NOTE - Follow Up Consult  Pharmacy Consult for heparin Indication:  CAD  Labs: Recent Labs    08/02/22 2023 08/02/22 2235 08/03/22 1046 08/03/22 1931 08/03/22 2126 08/04/22 0109  HGB 9.0*  --   --   --   --  8.9*  HCT 29.9*  --   --   --   --  28.4*  PLT 518*  --   --   --   --  493*  LABPROT  --  22.8* 22.5*  --   --  19.3*  INR  --  2.0* 2.0*  --   --  1.6*  HEPARINUNFRC  --   --   --   --   --  0.23*  CREATININE 2.57*  --   --   --   --   --   TROPONINIHS 387* 395*  --  304* 234*  --     Assessment: 42yo female subtherapeutic on heparin with initial dosing for continued severe CP in setting of CAD, INR now 1.6; no infusion issues or signs of bleeding per RN.  Goal of Therapy:  Heparin level 0.3-0.7 units/ml   Plan:  Will increase heparin infusion by 1 unit/kg/hr to 1300 units/hr and check level in 8 hours.    Wynona Neat, PharmD, BCPS  08/04/2022,1:39 AM

## 2022-08-04 NOTE — Progress Notes (Signed)
Rounding Note    Patient Name: Lauren [name] Date of Encounter: 08/04/2022  [place] Cardiologist: Carlyle Dolly, MD   Subjective   Feels much better.  No more chest pain.  Was able to ambulate to the bathroom and back without having angina.  No more short of breath than usual.  Inpatient Medications    Scheduled Meds:  amoxicillin-clavulanate  1 tablet Oral BID   atorvastatin  40 mg Oral Daily   clopidogrel  75 mg Oral Daily   doxycycline  100 mg Oral Q12H   hydrALAZINE  12.5 mg Oral BID   insulin pump   Subcutaneous TID WC, HS, 0200   levothyroxine  125 mcg Oral Q0600   metoprolol succinate  100 mg Oral Daily   ranolazine  1,000 mg Oral BID   saccharomyces boulardii  250 mg Oral BID   sodium chloride flush  3 mL Intravenous Q12H   tiZANidine  4 mg Oral QHS   traZODone  100 mg Oral QHS   warfarin  7.5 mg Oral ONCE-1600   Warfarin - Pharmacist Dosing Inpatient   Does not apply q1600   Continuous Infusions:  heparin 1,500 Units/hr (08/04/22 1509)   nitroGLYCERIN 10 mcg/min (08/04/22 1510)   PRN Meds: acetaminophen **OR** acetaminophen, hydrALAZINE, morphine injection, nitroGLYCERIN, ondansetron **OR** ondansetron (ZOFRAN) IV, oxyCODONE   Vital Signs    Vitals:   08/04/22 0040 08/04/22 0442 08/04/22 0744 08/04/22 1053  BP: 128/76 (!) 140/96 135/85 139/86  Pulse: 89 94 95 (!) 101  Resp: (!) 24 (!) 22 18 18   Temp: 98 F (36.7 C) 98.7 F (37.1 C) 98.1 F (36.7 C) 97.8 F (36.6 C)  TempSrc: Oral Oral Oral Oral  SpO2: 100% 99% 95% 99%  Weight: 100.2 kg     Height:        Intake/Output Summary (Last 24 hours) at 08/04/2022 1558 Last data filed at 08/04/2022 0000 Gross per 24 hour  Intake 431.54 ml  Output --  Net 431.54 ml      08/04/2022   12:40 AM 08/03/2022   12:57 PM 08/02/2022   11:13 PM  Last 3 Weights  Weight (lbs) 220 lb 14.4 oz 220 lb 14.4 oz 237 lb 9.6 oz  Weight (kg) 100.2 kg 100.2 kg 107.775 kg      Telemetry    Sinus-  Personally Reviewed  ECG    Not checked- Personally Reviewed  Physical Exam   GEN: No acute distress.  Sitting comfortably in the chair. Neck: No JVD Cardiac: RRR, no murmurs, rubs, or gallops.  Diminished pulses in the feet.  Did not do full examination of her lower extremities. Respiratory: Clear to auscultation bilaterally. GI: Soft, nontender, non-distended  MS: No edema; No deformity. Neuro:  Nonfocal  Psych: Normal affect   Labs    High Sensitivity Troponin:   Recent Labs  Lab 07/22/22 2110 08/02/22 2023 08/02/22 2235 08/03/22 1931 08/03/22 2126  TROPONINIHS 1,210* 387* 395* 304* 234*     Chemistry Recent Labs  Lab 08/02/22 2023 08/02/22 2235 08/04/22 0109  NA 140  --  139  K 4.4  --  4.7  CL 108  --  110  CO2 22  --  22  GLUCOSE 203*  --  139*  BUN 30*  --  23*  CREATININE 2.57*  --  2.13*  CALCIUM 8.1*  --  8.1*  PROT  --  6.2*  --   ALBUMIN  --  2.4*  --   AST  --  25  --   ALT  --  22  --   ALKPHOS  --  95  --   BILITOT  --  0.3  --   GFRNONAA 24*  --  29*  ANIONGAP 10  --  7    Lipids No results for input(s): "CHOL", "TRIG", "HDL", "LABVLDL", "LDLCALC", "CHOLHDL" in the last 168 hours.  Hematology Recent Labs  Lab 08/02/22 2023 08/04/22 0109  WBC 10.2 11.5*  RBC 3.25* 3.22*  HGB 9.0* 8.9*  HCT 29.9* 28.4*  MCV 92.0 88.2  MCH 27.7 27.6  MCHC 30.1 31.3  RDW 14.3 14.3  PLT 518* 493*   Thyroid No results for input(s): "TSH", "FREET4" in the last 168 hours.  BNP Recent Labs  Lab 08/02/22 2023  BNP 869.3*    DDimer No results for input(s): "DDIMER" in the last 168 hours.   Radiology    CT ABDOMEN PELVIS WO CONTRAST  Result Date: 08/03/2022 CLINICAL DATA:  42 year old female with history of diarrhea. Chest pain and shortness of breath. EXAM: CT ABDOMEN AND PELVIS WITHOUT CONTRAST TECHNIQUE: Multidetector CT imaging of the abdomen and pelvis was performed following the standard protocol without IV contrast. RADIATION DOSE REDUCTION:  This exam was performed according to the departmental dose-optimization program which includes automated exposure control, adjustment of the mA and/or kV according to patient size and/or use of iterative reconstruction technique. COMPARISON:  CT the abdomen and pelvis 06/23/2014. FINDINGS: Lower chest: Patchy areas of ground-glass attenuation and interlobular septal thickening noted throughout the visualize lung bases, favored to reflect some mild interstitial pulmonary edema. Mild cardiomegaly with mild left ventricular dilatation. Low-attenuation of the intravascular compartment, concerning for anemia. Atherosclerotic calcifications in the left circumflex and right coronary arteries. Multiple small pulmonary nodules noted in the periphery of the visualized lung bases bilaterally, generally 5 mm or less in size and stable compared to the prior study. The exception to this is a 9 x 11 mm right lower lobe pulmonary nodule (axial image 7 of series 5) which previously measured 7 x 6 mm. Hepatobiliary: No suspicious cystic or solid hepatic lesions are confidently identified on today's noncontrast CT examination. Status post cholecystectomy. Pancreas: No definite pancreatic mass or peripancreatic fluid collections or inflammatory changes are noted on today's noncontrast CT examination. Spleen: Unremarkable. Adrenals/Urinary Tract: Multiple tiny 1-4 mm calcifications associated with both renal hila appear to be vascular, although nonobstructive calculi are difficult to entirely exclude. No calcifications are noted along the course of either ureter or within the lumen of the urinary bladder. No hydroureteronephrosis. Unenhanced appearance of the kidneys and bilateral adrenal glands is otherwise normal. Urinary bladder is moderately distended but otherwise unremarkable in appearance. Stomach/Bowel: Unenhanced appearance of the stomach is normal. No pathologic dilatation of small bowel or colon. The appendix is normal in  size without surrounding inflammatory changes. Multiple small appendicoliths are incidentally noted. Vascular/Lymphatic: Atherosclerosis of the abdominal aorta and pelvic vasculature. No lymphadenopathy noted in the abdomen or pelvis. Reproductive: Unenhanced appearance of the uterus and ovaries is unremarkable. Other: No significant volume of ascites.  No pneumoperitoneum. Musculoskeletal: Numerous rounded areas of soft tissue prominence and architectural distortion are noted in the subcutaneous fat of the anterior abdominal wall bilaterally, likely injection granulomas. There are no aggressive appearing lytic or blastic lesions noted in the visualized portions of the skeleton. IMPRESSION: 1. No definite acute findings are noted in the abdomen or pelvis to account for the patient's symptoms. 2. Multiple pulmonary nodules generally stable compared to the  prior examination, with exception of an enlarging 9 x 11 mm macrolobulated nodule in the posterior aspect of the right lower lobe. Further evaluation with follow-up nonemergent chest CT is recommended in the near future to better evaluate the full extent of nodularity in the lungs and establish a baseline for follow-up examinations, as the possibility of neoplasm is not excluded. 3. Low attenuation of the intravascular compartment concerning for anemia. 4. The appearance of the lung bases is concerning for mild interstitial pulmonary edema. Given the cardiomegaly and mild left ventricular dilatation, clinical correlation for signs and symptoms of congestive heart failure is recommended. 5. Aortic atherosclerosis, in addition to at least 2 vessel coronary artery disease. Please note that although the presence of coronary artery calcium documents the presence of coronary artery disease, the severity of this disease and any potential stenosis cannot be assessed on this non-gated CT examination. Assessment for potential risk factor modification, dietary therapy or  pharmacologic therapy may be warranted, if clinically indicated. 6. Additional incidental findings, as above. Electronically Signed   By: Vinnie Langton M.D.   On: 08/03/2022 06:36   DG Chest 2 View  Result Date: 08/02/2022 CLINICAL DATA:  Sob, chest pain EXAM: CHEST - 2 VIEW COMPARISON:  07/19/2022 FINDINGS: The heart size and mediastinal contours are within normal limits. There is diffuse pulmonary interstitial prominence which could be seen with atypical infection, edema or interstitial lung disease. There is no focal consolidation. No pneumothorax or pleural effusion. The visualized osseous structures are unremarkable. IMPRESSION: Nonspecific interstitial prominence consistent with atypical infection, edema or interstitial lung disease. No focal consolidation. Electronically Signed   By: Sammie Bench M.D.   On: 08/02/2022 20:39    Cardiac Studies   Echo: 07/24/2022  IMPRESSIONS     1. Left ventricular ejection fraction, by estimation, is 30 to 35%. The  left ventricle has moderately decreased function. The left ventricle  demonstrates regional wall motion abnormalities (see scoring  diagram/findings for description). There is  moderate left ventricular hypertrophy. Left ventricular diastolic  parameters are indeterminate.   2. Right ventricular systolic function is mildly reduced. The right  ventricular size is normal. There is mildly elevated pulmonary artery  systolic pressure. The estimated right ventricular systolic pressure is  Q000111Q mmHg.   3. Left atrial size was moderately dilated.   4. The mitral valve is grossly normal. Mild to moderate mitral valve  regurgitation.   5. The aortic valve was not well visualized. Aortic valve regurgitation  is not visualized. Aortic valve mean gradient measures 2.0 mmHg.   6. The inferior vena cava is normal in size with greater than 50%  respiratory variability, suggesting right atrial pressure of 3 mmHg.   Comparison(s): Prior images  reviewed side by side. LVEF has decreased in  comparison, 30-35% range with wall motion abnormalities. Mitral  regurgitation mild to moderate.   FINDINGS   Left Ventricle: Left ventricular ejection fraction, by estimation, is 30  to 35%. The left ventricle has moderately decreased function. The left  ventricle demonstrates regional wall motion abnormalities. Definity  contrast agent was given IV to delineate  the left ventricular endocardial borders. The left ventricular internal  cavity size was normal in size. There is moderate left ventricular  hypertrophy. Left ventricular diastolic parameters are indeterminate.     LV Wall Scoring:  The mid and distal anterior septum, apical lateral segment, apical  inferior  segment, and apex are akinetic. The inferior wall, basal anteroseptal  segment, mid inferolateral segment, mid  inferoseptal segment, apical  anterior  segment, and basal inferoseptal segment are hypokinetic. The anterior  wall,  antero-lateral wall, and basal inferolateral segment are normal.   Right Ventricle: The right ventricular size is normal. No increase in  right ventricular wall thickness. Right ventricular systolic function is  mildly reduced. There is mildly elevated pulmonary artery systolic  pressure. The tricuspid regurgitant velocity   is 2.92 m/s, and with an assumed right atrial pressure of 3 mmHg, the  estimated right ventricular systolic pressure is Q000111Q mmHg.   Left Atrium: Left atrial size was moderately dilated.   Right Atrium: Right atrial size was normal in size.   Pericardium: There is no evidence of pericardial effusion.   Mitral Valve: The mitral valve is grossly normal. Mild to moderate mitral  valve regurgitation.   Tricuspid Valve: The tricuspid valve is grossly normal. Tricuspid valve  regurgitation is trivial.   Aortic Valve: The aortic valve was not well visualized. Aortic valve  regurgitation is not visualized. Aortic valve mean  gradient measures 2.0  mmHg. Aortic valve peak gradient measures 2.9 mmHg. Aortic valve area, by  VTI measures 2.27 cm.   Pulmonic Valve: The pulmonic valve was not well visualized. Pulmonic valve  regurgitation is trivial.   Aorta: The aortic root is normal in size and structure.   Venous: The inferior vena cava is normal in size with greater than 50%  respiratory variability, suggesting right atrial pressure of 3 mmHg.   IAS/Shunts: No atrial level shunt detected by color flow Doppler.   Cath: 07/26/2022    Ost LAD to Prox LAD lesion is 75% stenosed.   Prox LAD to Mid LAD lesion is 100% stenosed.   Prox Cx to Dist Cx lesion is 35% stenosed with 35% stenosed side branch in LPAV.   Prox RCA lesion is 95% stenosed.   Mid RCA lesion is 95% stenosed.   Dist RCA lesion is 100% stenosed.   LV end diastolic pressure is mildly elevated.   2 vessel occlusive CAD. The LAD is occluded after a small diagonal. The RCA is small and diffusely diseased. It is occluded distally Mildly elevated LVEDP   Plan: medical management. No suitable targets for revascularization.   Diagnostic Dominance: Co-dominant   Patient Profile     42 y.o. female with a hx of CVA, severe 2v CAD c/b recent NSTEMI, CKD4, osteomyelitis who was seen 08/03/2022 for the evaluation of CP at the request of ED.   Assessment & Plan    Chest pain concerning for microvascular angina Elevated Troponin likely trending down=.  As previously noted she is not felt to be a candidate for PCI or CABG. Medical therapy is our only option.  -- hsTn trending down, no recurrent chest pain (I suspect that the troponin elevation is actually still coming down from her recent hospitalization and not a new event this time around..  ==> Plan was to treat overnight with IV heparin with coumadin held. Given no chest pain, will plan to resume coumadin this evening as INR is subtherapeutic (after discussing with pharmacist, plan will be to give her  7.5 mg tonight and then have her discharged on home dosing)  -- continue plavix, Toprol, (recently started) ranexa 500 mg twice daily.  Statin -> DC nitroglycerin infusion and restart Ismo at double dose (40 mg twice daily  HFpEF -- no significant volume overload on exam => If dyspnea becomes an issue, may want to consider low-dose oral diuretic versus spironolactone in the outpatient  setting.  HTN -- Initially elevated on admission, now improved -- continue Toprol   Per primary: Left foot osteo CVA PVD CKD stage IV DNR   Dispo: She is feeling great, would like to go home.  My recommendation be to see how she does off of the IV nitroglycerin and IV heparin and she has been given her Ismo.  If she is able to be active to that her level of exertion without angina symptoms, would be okay to discharge this evening if possible versus tomorrow.   [place] will sign off.   Medication Recommendations:  _> New medications, Ranexa 500 mg twice daily, and increased dose of Ismo to 40 mg twice daily. She will restart warfarin at 7.5 mg a day and then go back to her home regimen on discharge. Other recommendations (labs, testing, etc): None for now Follow up as an outpatient:  We will arrange outpatient follow-up with our daytime shift tomorrow.Weston Brass,  Leonie Man, MD, MS Glenetta Hew, M.D., M.S. Interventional Cardiologist  [place]  Pager # [phone] Phone # [phone] [address] [place], [place] [address]   For questions or updates, please contact [place] Please consult [email or web address] for contact info under

## 2022-08-04 NOTE — Progress Notes (Signed)
PROGRESS NOTE    Lauren [name]  [id number] DOB: [birth year] DOA: 08/02/2022 PCP: Johnette Abraham, MD   Brief Narrative: This 42 years old female with PMH significant of CAD, stage 4 CKD, chronic systolic CHF, DM, CVA, and obesity presenting with chest pain, diarrhea.  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  She was discharged on Doxy/Augmentin.  She also had CP during that admission and underwent cath on 3/25 and CAD was not amenable to intervention and so was treated medically with Plavix, statin, and Ranexa.  She reports that all was well until last night.  She developed acute onset of chest pain at rest.  Substernal pain, took 2 NTG without relief.  It felt like there was an elephant sitting on her chest.  She has ongoing diarrhea, denies any sick contacts. She is on p.o. antibiotics and thought it was related to antibiotics.  Patient is admitted for further evaluation,  started on IV heparin and cardiology is consulted.  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 She had recent LHC with known disease that does not appear amenable to intervention , advised medical management only. CXR nonspecific . Trop UK:[id number]. EKG not indicative of acute ischemia.   Cardiology is consulted. It could be due to demand ischemia in the setting of acute onset of  diarrhea. Cardiology reports that there are no good targets for intervention and she will need to be medically managed.  Diarrhea : Patient presented with acute onset of severe diarrhea Likely VGE, as C diff and GI pathogen panel are negative. Continue Supportive care for now Continue gentle IV hydration.   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 She reports that it is healing well and declined having undress it to examine it Continue Augmentin, Doxycycline   PVD: S/p Angioplasty with stent placement on 2/8 Continue Plavix   H/o CVA: L hemiparesis, upper > lower She underwent thrombectomy for this as well as for RUE ischemia associated with brachial artery embolus These events were both thought to be embolic in nature and so she was started on Coumadin Pharmacy to dose Coumadin   DM: Recent A1c was 7.0 Continue insulin pump Diabetes coordinator was consulted   Chronic systolic CHF Appears compensated  Continue Plavix, Ranexa, Imdur Hold Lasix.  Hyperlipidemia: Continue Lipitor   Essential hypertension: Continue hydralazine, Toprol XL (at increased dose)   Hypothyroidism : Continue Synthroid   Tobacco dependence: -Not currently smoking, praise provided   Stage 4 CKD: Appears to be stable at this time Attempt to avoid nephrotoxic medications Recheck BMP in AM    Chronic pain Continue Oxy IR, tizanidine   Obesity Body mass index is 36.76 kg/m.  Weight loss should be encouraged Outpatient PCP/bariatric medicine f/u encouraged    DNR -MOST form from 01/2022 indicates that patient is DNR -I have discussed code status with the  patient and she would not desire resuscitation and would prefer to die a natural death should that situation arise.    DVT prophylaxis: Heparin IV Code Status: DNR Family Communication: Husband at bed side Disposition Plan:     Status is: Inpatient Remains inpatient appropriate because: Admitted for NSTEMI.   Cardiology is consulted.  Started on IV heparin    Consultants:  Cardiology  Procedures: None  Antimicrobials:None  Subjective: Patient was seen and examined at bedside.  Overnight events noted.   Patient was lying in the bed with husband at bedside.  She reports chest pain has improved.  Objective: Vitals:   08/04/22 0040 08/04/22 0442 08/04/22 0744 08/04/22 1053  BP: 128/76 (!) 140/96 135/85 139/86  Pulse: 89 94 95 (!) 101  Resp: (!) 24 (!) 22 18 18   Temp: 98 F (36.7 C) 98.7 F (37.1 C) 98.1 F (36.7 C) 97.8 F (36.6 C)  TempSrc: Oral Oral Oral Oral  SpO2: 100% 99% 95% 99%  Weight: 100.2 kg     Height:        Intake/Output Summary (Last 24 hours) at 08/04/2022 1504 Last data filed at 08/04/2022 0000 Gross per 24 hour  Intake 431.54 ml  Output --  Net 431.54 ml   Filed Weights   08/02/22 2313 08/03/22 1257 08/04/22 0040  Weight: 107.8 kg 100.2 kg 100.2 kg    Examination:  General exam: Appears calm and comfortable , not in any acute distress. Respiratory system: Clear to auscultation. Respiratory effort normal.  RR 15 Cardiovascular system: S1 & S2 heard, RRR. No JVD, murmurs, rubs, gallops or clicks.  Gastrointestinal system: Abdomen is soft, non tender, non distended, BS+ Central nervous system: Alert and oriented x 3. No focal neurological deficits. Extremities: No edema, no cyanosis, no clubbing Skin: No rashes, lesions or ulcers Psychiatry: Judgement and insight appear normal. Mood & affect appropriate.     Data Reviewed: I have personally reviewed following labs and imaging studies  CBC: Recent Labs  Lab 08/02/22 2023 08/04/22 0109  WBC 10.2 11.5*  HGB 9.0* 8.9*  HCT 29.9* 28.4*  MCV 92.0 88.2  PLT 518* [id number]*   Basic Metabolic Panel: Recent Labs  Lab 08/02/22 2023 08/04/22 0109  NA 140 139  K 4.4 4.7  CL 108 110  CO2 22 22  GLUCOSE 203* 139*  BUN 30* 23*  CREATININE 2.57* 2.13*  CALCIUM 8.1* 8.1*   GFR: Estimated Creatinine  Clearance: 41.2 mL/min (A) (by C-G formula based on SCr of 2.13 mg/dL (H)). Liver Function Tests: Recent Labs  Lab 08/02/22 2235  AST 25  ALT 22  ALKPHOS 95  BILITOT 0.3  PROT 6.2*  ALBUMIN 2.4*   No results for input(s): "LIPASE", "AMYLASE" in the last 168 hours. No results for input(s): "AMMONIA" in the last 168 hours. Coagulation Profile: Recent Labs  Lab 07/29/22 0841 08/02/22 0757 08/02/22 2235 08/03/22 1046 08/04/22 0109  INR 1.7* 3.0 2.0* 2.0* 1.6*   Cardiac Enzymes: No results for input(s): "CKTOTAL", "CKMB", "CKMBINDEX", "TROPONINI" in the last 168 hours. BNP (last 3 results) No results for input(s): "PROBNP" in the last 8760 hours. HbA1C: No results for input(s): "HGBA1C" in the last 72 hours. CBG: Recent Labs  Lab 08/03/22 1624 08/03/22 2115 08/04/22 0422 08/04/22 0808 08/04/22 1109  GLUCAP 182* 125* 133* 116* 109*   Lipid Profile: No results for input(s): "CHOL", "HDL", "LDLCALC", "TRIG", "CHOLHDL", "LDLDIRECT" in the last 72 hours. Thyroid Function Tests: No results for input(s): "TSH", "T4TOTAL", "FREET4", "T3FREE", "THYROIDAB"  in the last 72 hours. Anemia Panel: No results for input(s): "VITAMINB12", "FOLATE", "FERRITIN", "TIBC", "IRON", "RETICCTPCT" in the last 72 hours. Sepsis Labs: No results for input(s): "PROCALCITON", "LATICACIDVEN" in the last 168 hours.  Recent Results (from the past 240 hour(s))  Resp panel by RT-PCR (RSV, Flu A&B, Covid) Anterior Nasal Swab     Status: None   Collection Time: 08/02/22  8:12 PM   Specimen: Anterior Nasal Swab  Result Value Ref Range Status   SARS Coronavirus 2 by RT PCR NEGATIVE NEGATIVE Final   Influenza A by PCR NEGATIVE NEGATIVE Final   Influenza B by PCR NEGATIVE NEGATIVE Final    Comment: (NOTE) The Xpert Xpress SARS-CoV-2/FLU/RSV plus assay is intended as an aid in the diagnosis of influenza from Nasopharyngeal swab specimens and should not be used as a sole basis for treatment. Nasal washings  and aspirates are unacceptable for Xpert Xpress SARS-CoV-2/FLU/RSV testing.  Fact Sheet for Patients: EntrepreneurPulse.com.au  Fact Sheet for Healthcare Providers: IncredibleEmployment.be  This test is not yet approved or cleared by the [place] FDA and has been authorized for detection and/or diagnosis of SARS-CoV-2 by FDA under an Emergency Use Authorization (EUA). This EUA will remain in effect (meaning this test can be used) for the duration of the COVID-19 declaration under Section 564(b)(1) of the Act, 21 U.S.C. section 360bbb-3(b)(1), unless the authorization is terminated or revoked.     Resp Syncytial Virus by PCR NEGATIVE NEGATIVE Final    Comment: (NOTE) Fact Sheet for Patients: EntrepreneurPulse.com.au  Fact Sheet for Healthcare Providers: IncredibleEmployment.be  This test is not yet approved or cleared by the [place] FDA and has been authorized for detection and/or diagnosis of SARS-CoV-2 by FDA under an Emergency Use Authorization (EUA). This EUA will remain in effect (meaning this test can be used) for the duration of the COVID-19 declaration under Section 564(b)(1) of the Act, 21 U.S.C. section 360bbb-3(b)(1), unless the authorization is terminated or revoked.  Performed at [facility], [place] [address] [place], [place] [address]   C Difficile Quick Screen w PCR reflex     Status: None   Collection Time: 08/03/22 12:22 AM   Specimen: STOOL  Result Value Ref Range Status   C Diff antigen NEGATIVE NEGATIVE Final   C Diff toxin NEGATIVE NEGATIVE Final   C Diff interpretation No C. difficile detected.  Final    Comment: Performed at [facility], [place] [address] [place], [place] [address]  Gastrointestinal Panel by PCR , Stool     Status: None   Collection Time: 08/03/22 12:22 AM   Specimen: STOOL  Result Value Ref Range Status   Campylobacter species NOT  DETECTED NOT DETECTED Final   Plesimonas shigelloides NOT DETECTED NOT DETECTED Final   Salmonella species NOT DETECTED NOT DETECTED Final   Yersinia enterocolitica NOT DETECTED NOT DETECTED Final   Vibrio species NOT DETECTED NOT DETECTED Final   Vibrio cholerae NOT DETECTED NOT DETECTED Final   Enteroaggregative E coli (EAEC) NOT DETECTED NOT DETECTED Final   Enteropathogenic E coli (EPEC) NOT DETECTED NOT DETECTED Final   Enterotoxigenic E coli (ETEC) NOT DETECTED NOT DETECTED Final   Shiga like toxin producing E coli (STEC) NOT DETECTED NOT DETECTED Final   Shigella/Enteroinvasive E coli (EIEC) NOT DETECTED NOT DETECTED Final   Cryptosporidium NOT DETECTED NOT DETECTED Final   Cyclospora cayetanensis NOT DETECTED NOT DETECTED Final   Entamoeba histolytica NOT DETECTED NOT DETECTED Final   Giardia lamblia NOT  DETECTED NOT DETECTED Final   Adenovirus F40/41 NOT DETECTED NOT DETECTED Final   Astrovirus NOT DETECTED NOT DETECTED Final   Norovirus GI/GII NOT DETECTED NOT DETECTED Final   Rotavirus A NOT DETECTED NOT DETECTED Final   Sapovirus (I, II, IV, and V) NOT DETECTED NOT DETECTED Final    Comment: Performed at [facility], [address], [place], [place] [address]    Radiology Studies: CT ABDOMEN PELVIS WO CONTRAST  Result Date: 08/03/2022 CLINICAL DATA:  42 year old female with history of diarrhea. Chest pain and shortness of breath. EXAM: CT ABDOMEN AND PELVIS WITHOUT CONTRAST TECHNIQUE: Multidetector CT imaging of the abdomen and pelvis was performed following the standard protocol without IV contrast. RADIATION DOSE REDUCTION: This exam was performed according to the departmental dose-optimization program which includes automated exposure control, adjustment of the mA and/or kV according to patient size and/or use of iterative reconstruction technique. COMPARISON:  CT the abdomen and pelvis 06/23/2014. FINDINGS: Lower chest: Patchy areas of ground-glass attenuation and  interlobular septal thickening noted throughout the visualize lung bases, favored to reflect some mild interstitial pulmonary edema. Mild cardiomegaly with mild left ventricular dilatation. Low-attenuation of the intravascular compartment, concerning for anemia. Atherosclerotic calcifications in the left circumflex and right coronary arteries. Multiple small pulmonary nodules noted in the periphery of the visualized lung bases bilaterally, generally 5 mm or less in size and stable compared to the prior study. The exception to this is a 9 x 11 mm right lower lobe pulmonary nodule (axial image 7 of series 5) which previously measured 7 x 6 mm. Hepatobiliary: No suspicious cystic or solid hepatic lesions are confidently identified on today's noncontrast CT examination. Status post cholecystectomy. Pancreas: No definite pancreatic mass or peripancreatic fluid collections or inflammatory changes are noted on today's noncontrast CT examination. Spleen: Unremarkable. Adrenals/Urinary Tract: Multiple tiny 1-4 mm calcifications associated with both renal hila appear to be vascular, although nonobstructive calculi are difficult to entirely exclude. No calcifications are noted along the course of either ureter or within the lumen of the urinary bladder. No hydroureteronephrosis. Unenhanced appearance of the kidneys and bilateral adrenal glands is otherwise normal. Urinary bladder is moderately distended but otherwise unremarkable in appearance. Stomach/Bowel: Unenhanced appearance of the stomach is normal. No pathologic dilatation of small bowel or colon. The appendix is normal in size without surrounding inflammatory changes. Multiple small appendicoliths are incidentally noted. Vascular/Lymphatic: Atherosclerosis of the abdominal aorta and pelvic vasculature. No lymphadenopathy noted in the abdomen or pelvis. Reproductive: Unenhanced appearance of the uterus and ovaries is unremarkable. Other: No significant volume of  ascites.  No pneumoperitoneum. Musculoskeletal: Numerous rounded areas of soft tissue prominence and architectural distortion are noted in the subcutaneous fat of the anterior abdominal wall bilaterally, likely injection granulomas. There are no aggressive appearing lytic or blastic lesions noted in the visualized portions of the skeleton. IMPRESSION: 1. No definite acute findings are noted in the abdomen or pelvis to account for the patient's symptoms. 2. Multiple pulmonary nodules generally stable compared to the prior examination, with exception of an enlarging 9 x 11 mm macrolobulated nodule in the posterior aspect of the right lower lobe. Further evaluation with follow-up nonemergent chest CT is recommended in the near future to better evaluate the full extent of nodularity in the lungs and establish a baseline for follow-up examinations, as the possibility of neoplasm is not excluded. 3. Low attenuation of the intravascular compartment concerning for anemia. 4. The appearance of the lung bases is concerning for mild  interstitial pulmonary edema. Given the cardiomegaly and mild left ventricular dilatation, clinical correlation for signs and symptoms of congestive heart failure is recommended. 5. Aortic atherosclerosis, in addition to at least 2 vessel coronary artery disease. Please note that although the presence of coronary artery calcium documents the presence of coronary artery disease, the severity of this disease and any potential stenosis cannot be assessed on this non-gated CT examination. Assessment for potential risk factor modification, dietary therapy or pharmacologic therapy may be warranted, if clinically indicated. 6. Additional incidental findings, as above. Electronically Signed   By: Vinnie Langton M.D.   On: 08/03/2022 06:36   DG Chest 2 View  Result Date: 08/02/2022 CLINICAL DATA:  Sob, chest pain EXAM: CHEST - 2 VIEW COMPARISON:  07/19/2022 FINDINGS: The heart size and mediastinal  contours are within normal limits. There is diffuse pulmonary interstitial prominence which could be seen with atypical infection, edema or interstitial lung disease. There is no focal consolidation. No pneumothorax or pleural effusion. The visualized osseous structures are unremarkable. IMPRESSION: Nonspecific interstitial prominence consistent with atypical infection, edema or interstitial lung disease. No focal consolidation. Electronically Signed   By: Sammie Bench M.D.   On: 08/02/2022 20:39    Scheduled Meds:  amoxicillin-clavulanate  1 tablet Oral BID   atorvastatin  40 mg Oral Daily   clopidogrel  75 mg Oral Daily   doxycycline  100 mg Oral Q12H   hydrALAZINE  12.5 mg Oral BID   insulin pump   Subcutaneous TID WC, HS, 0200   levothyroxine  125 mcg Oral Q0600   metoprolol succinate  100 mg Oral Daily   ranolazine  1,000 mg Oral BID   saccharomyces boulardii  250 mg Oral BID   sodium chloride flush  3 mL Intravenous Q12H   tiZANidine  4 mg Oral QHS   traZODone  100 mg Oral QHS   warfarin  7.5 mg Oral ONCE-1600   Warfarin - Pharmacist Dosing Inpatient   Does not apply q1600   Continuous Infusions:  heparin 1,450 Units/hr (08/04/22 1448)   nitroGLYCERIN 15 mcg/min (08/03/22 2136)     LOS: 1 day    Time spent: 13 mins    Duard Brady, MD [facility]   If 7PM-7AM, please contact night-coverage

## 2022-08-04 NOTE — Progress Notes (Signed)
[place] for heparin Indication: chest pain/ACS  Allergies  Allergen Reactions   Visipaque [Iodixanol] Nausea And Vomiting    Patient had vagal response during procedure became hypertensive, and bradycardic. CO2 used during procedure prior to contrast being used, this may be cause as well. Recommended premedicating prior to contrast being used in the future.    Wellbutrin [Bupropion] Hives   Cefepime Rash    Tolerates penicilllin   Ciprofloxacin Hcl Hives and Rash    Hives/rash at injection site; 01/15/22 tolerated IV cipro   Tape Rash    Patient Measurements: Height: 5\' 5"  (165.1 cm) Weight: 100.2 kg (220 lb 14.4 oz) IBW/kg (Calculated) : 57 Heparin Dosing Weight: 80kg  Vital Signs: Temp: 97.8 F (36.6 C) (04/03 1053) Temp Source: Oral (04/03 1053) BP: 139/86 (04/03 1053) Pulse Rate: 101 (04/03 1053)  Labs: Recent Labs    08/02/22 2023 08/02/22 2235 08/03/22 1046 08/03/22 1931 08/03/22 2126 08/04/22 0109 08/04/22 1303  HGB 9.0*  --   --   --   --  8.9*  --   HCT 29.9*  --   --   --   --  28.4*  --   PLT 518*  --   --   --   --  493*  --   LABPROT  --  22.8* 22.5*  --   --  19.3*  --   INR  --  2.0* 2.0*  --   --  1.6*  --   HEPARINUNFRC  --   --   --   --   --  0.23* <0.10*  CREATININE 2.57*  --   --   --   --  2.13*  --   TROPONINIHS 387* 395*  --  304* 234*  --   --      Estimated Creatinine Clearance: 41.2 mL/min (A) (by C-G formula based on SCr of 2.13 mg/dL (H)).   Medical History: Past Medical History:  Diagnosis Date   Anemia    CAD (coronary artery disease)    a. s/p cath in 03/2014 showing 30% mid-LAD, moderate to severe disease along small D1, patent LCx, moderate to severe distal OM2 stenosis and moderate diffuse diease along RCA not amenable to PCI   CHF (congestive heart failure)    a. EF 55-60% in 12/2019 b. EF at 35-40% by echo in 05/2020   CKD (chronic kidney disease) stage 4, GFR 15-29 ml/min     Diabetes mellitus without complication    Myocardial infarction    Neuropathy    PVD (peripheral vascular disease)    Stroke 01/2022   L hand weakness     Assessment: 42 yo W on warfarin for CVA with admit INR of 2.0. Patient admitted for chest pain unresponsive to nitroglycerin likely due to NSTEMI in setting of diarrhea. Pharmacy consulted for heparin.    Heparin level today came back subtherapeutic at <0.1, on 1300 units/hr. No s/sx of bleeding or infusion issues. Hgb 8.9, plt 493. INR is subtherapeutic at 1.6.  PTA regimen 5 mg daily except 7.5 mg Sat   Goal of Therapy:  INR 2-3 Heparin level 0.3-0.7 units/ml Monitor platelets by anticoagulation protocol: Yes   Plan:  Order warfarin 7.5 mg tonight  Increase heparin 1500 units/hr, no bolus >> 6 hr HL Monitor daily INR,heparin level, CBC Monitor for signs/symptoms of bleeding  F/u cardiology plans  Thank you for allowing pharmacy to participate in this patient's care,  Antonietta Jewel, PharmD,  BCCCP Clinical Pharmacist  Phone: [phone] 08/04/2022 2:55 PM  Please check AMION for all [place] phone numbers After 10:00 PM, call [place] [phone]

## 2022-08-04 NOTE — Telephone Encounter (Signed)
[name] with pre admit called to let you know that Lauren [name] was admitted to [facility] with chest pain. She is scheduled for surgery on 08/06/2022

## 2022-08-05 ENCOUNTER — Ambulatory Visit (HOSPITAL_COMMUNITY): Payer: BC Managed Care – PPO | Admitting: Physical Therapy

## 2022-08-05 DIAGNOSIS — R0782 Intercostal pain: Secondary | ICD-10-CM

## 2022-08-05 LAB — CBC
HCT: 29.6 % — ABNORMAL LOW (ref 36.0–46.0)
Hemoglobin: 8.9 g/dL — ABNORMAL LOW (ref 12.0–15.0)
MCH: 27.6 pg (ref 26.0–34.0)
MCHC: 30.1 g/dL (ref 30.0–36.0)
MCV: 91.6 fL (ref 80.0–100.0)
Platelets: 480 10*3/uL — ABNORMAL HIGH (ref 150–400)
RBC: 3.23 MIL/uL — ABNORMAL LOW (ref 3.87–5.11)
RDW: 14.6 % (ref 11.5–15.5)
WBC: 10.1 10*3/uL (ref 4.0–10.5)
nRBC: 0 % (ref 0.0–0.2)

## 2022-08-05 LAB — BASIC METABOLIC PANEL
Anion gap: 9 (ref 5–15)
BUN: 30 mg/dL — ABNORMAL HIGH (ref 6–20)
CO2: 20 mmol/L — ABNORMAL LOW (ref 22–32)
Calcium: 7.9 mg/dL — ABNORMAL LOW (ref 8.9–10.3)
Chloride: 107 mmol/L (ref 98–111)
Creatinine, Ser: 2.36 mg/dL — ABNORMAL HIGH (ref 0.44–1.00)
GFR, Estimated: 26 mL/min — ABNORMAL LOW (ref >60)
Glucose, Bld: 135 mg/dL — ABNORMAL HIGH (ref 70–99)
Potassium: 5 mmol/L (ref 3.5–5.1)
Sodium: 136 mmol/L (ref 135–145)

## 2022-08-05 LAB — GLUCOSE, CAPILLARY
Glucose-Capillary: 148 mg/dL — ABNORMAL HIGH (ref 70–99)
Glucose-Capillary: 139 mg/dL — ABNORMAL HIGH (ref 70–99)

## 2022-08-05 LAB — PROTIME-INR
INR: 1.6 — ABNORMAL HIGH (ref 0.8–1.2)
Prothrombin Time: 19.1 seconds — ABNORMAL HIGH (ref 11.4–15.2)

## 2022-08-05 MED ORDER — WARFARIN SODIUM 3 MG PO TABS: 6.0000 mg | ORAL_TABLET | Freq: Once | ORAL | Status: DC

## 2022-08-05 MED ORDER — METOPROLOL SUCCINATE ER 100 MG PO TB24: 100.0000 mg | ORAL_TABLET | Freq: Every day | ORAL | 1 refills | Status: DC

## 2022-08-05 MED ORDER — SCOPOLAMINE 1 MG/3DAYS TD PT72
1.0000 | MEDICATED_PATCH | Freq: Once | TRANSDERMAL | Status: DC
  Administered 2022-08-05: 1.5 mg via TRANSDERMAL
  Filled 2022-08-05: qty 1

## 2022-08-05 MED ORDER — SCOPOLAMINE 1 MG/3DAYS TD PT72: 1.0000 | MEDICATED_PATCH | TRANSDERMAL | Status: DC

## 2022-08-05 MED ORDER — RANOLAZINE ER 1000 MG PO TB12: 1000.0000 mg | ORAL_TABLET | Freq: Two times a day (BID) | ORAL | 1 refills | Status: DC

## 2022-08-05 MED ORDER — ISOSORBIDE MONONITRATE 20 MG PO TABS: 40.0000 mg | ORAL_TABLET | Freq: Two times a day (BID) | ORAL | 1 refills | Status: DC

## 2022-08-05 NOTE — Progress Notes (Signed)
Pharmacist Heart Failure Core Measure Documentation  Assessment: Lauren [name] has an EF documented as 35% by Echo.  Rationale: Heart failure patients with left ventricular systolic dysfunction (LVSD) and an EF < 40% should be prescribed an angiotensin converting enzyme inhibitor (ACEI) or angiotensin receptor blocker (ARB) at discharge unless a contraindication is documented in the medical record.  This patient is not currently on an ACEI or ARB for HF.  This note is being placed in the record in order to provide documentation that a contraindication to the use of these agents is present for this encounter.  ACE Inhibitor or Angiotensin Receptor Blocker is contraindicated (specify all that apply)  []   ACEI allergy AND ARB allergy []   Angioedema []   Moderate or severe aortic stenosis []   Hyperkalemia []   Hypotension []   Renal artery stenosis [x]   Worsening renal function, preexisting renal disease or dysfunction   Bonnita Nasuti Pharm.D. CPP, BCPS Clinical Pharmacist [phone] 08/05/2022 9:23 AM

## 2022-08-05 NOTE — Discharge Instructions (Signed)
Advised to follow up with PCP in one week. Advised to follow up Cardiology as scheduled. Advised to follow-up with orthopedics as scheduled. Advised to continue Augmentin and doxycycline as prescribed for gangrenous foot.  Discharge medications: Toprol 100 mg daily Ranexa 1000 mg p.o. twice daily Plavix 75 mg daily Lipitor 80 mg daily. A small 40 mg twice daily

## 2022-08-05 NOTE — Progress Notes (Signed)
I spoke with the surgery scheduler, Ria Comment at  for [name], she and [name] is aware that patient is in the hospital, patient will be cancelled for 08/06/22 surgery.

## 2022-08-05 NOTE — Discharge Summary (Signed)
Physician Discharge Summary  Lauren [name] [id number] DOB: Sep 09, 1980 DOA: 08/02/2022  PCP: Johnette Abraham, MD  Admit date: 08/02/2022  Discharge date: 08/05/2022  Admitted From: Home.  Disposition:  Home.  Recommendations for Outpatient Follow-up:  Follow up with PCP in 1-2 weeks. Please obtain BMP/CBC in one week. Advised to follow up Cardiology as scheduled.. Advised to follow-up with orthopedics as scheduled. Advised to continue Augmentin and doxycycline as prescribed for gangrenous foot.  Home Health:None Equipment/Devices:None  Discharge Condition: Stable CODE STATUS:DNR Diet recommendation: Heart Healthy   Brief [facility] Course: This 42 years old female with PMH significant of CAD, stage 4 CKD, chronic systolic CHF, DM, CVA, and obesity presenting with chest pain, diarrhea.  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 She was discharged on Doxy/Augmentin.  She also had CP during that admission and underwent LH cath on 3/25 and CAD was not amenable to intervention and so was treated medically with Plavix, statin, and Ranexa.  She reports that all was well until last night.  She developed acute onset of chest pain at rest.  Substernal pain, took 2 NTG without relief.  It felt like there was an elephant sitting on her chest.  She has ongoing diarrhea, denies any sick contacts. She is on p.o. antibiotics and thought it was related to antibiotics.  Patient is admitted for further evaluation,  started on IV heparin and cardiology is consulted.  She is not felt to be a candidate for PCI or CABG.  Medical therapy is her only option.  Chest pain has resolved.  Patient wanted to be discharged.  Heparin discontinued,  continued on Coumadin.  Patient is being discharged home.  Cardiologist adjusted her medications.  Discharge medications: Toprol 100 mg daily Ranexa 1000 mg p.o. twice  daily Plavix 75 mg daily Lipitor 80 mg daily. 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 She had recent LHC with known disease that does not appear amenable to intervention , advised medical management only. CXR nonspecific . Trop [id number]. EKG not indicative of acute ischemia.   Cardiology is consulted. It could be due to demand ischemia in the setting of acute onset of diarrhea. Cardiology reports that there are no good targets for intervention and she will need to be medically managed. Chest pain resolved.  She is being discharged home.   Diarrhea : Patient presented with acute onset of severe diarrhea Likely VGE, as C diff and GI pathogen panel are negative. Continue Supportive care for now Continue gentle IV hydration.   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   Hyperlipidemia: Continue Lipitor   Essential hypertension: Continue hydralazine, Toprol XL (at increased dose)   Hypothyroidism : Continue Synthroid.   Tobacco dependence: -Not currently smoking, praise provided   Stage 4 CKD: Appears to be stable at this time Attempt to avoid nephrotoxic medications Recheck BMP in AM    Chronic pain Continue Oxy IR, tizanidine   Obesity Body mass index is 36.76 kg/m.  Weight loss should be encouraged Outpatient PCP/bariatric medicine f/u encouraged    DNR -MOST form from 01/2022 indicates that patient is DNR -I have discussed code status with the patient and she would not desire resuscitation and would prefer to die a natural death should that situation arise.    Discharge Instructions  Discharge Instructions     Call MD for:  persistant dizziness or light-headedness   Complete by: As directed    Call MD for:  persistant nausea and vomiting   Complete by: As directed    Diet - low sodium heart healthy   Complete by: As directed    Diet Carb Modified   Complete by: As directed    Discharge instructions   Complete by: As directed    Advised to follow up with PCP in one week. Advised to follow up Cardiology as scheduled. Advised to follow-up with orthopedics as scheduled. Advised to continue Augmentin and doxycycline as prescribed for gangrenous foot.  Discharge medications: Toprol 100 mg daily Ranexa 1000 mg p.o. twice daily Plavix 75 mg daily Lipitor 80 mg daily. A small 40 mg twice daily   Discharge wound care:   Complete by: As directed    Follow up wound care as advised.   Increase activity slowly   Complete by: As directed       Allergies as of 08/05/2022       Reactions   Visipaque [iodixanol] Nausea And Vomiting   Patient had vagal response during procedure became hypertensive, and bradycardic. CO2 used during procedure prior to contrast being used, this may be cause as well.  Recommended premedicating prior to contrast being used in the future.    Wellbutrin [bupropion] Hives   Cefepime Rash   Tolerates penicilllin   Ciprofloxacin Hcl Hives, Rash   Hives/rash at injection site; 01/15/22 tolerated IV cipro   Tape Rash        Medication List     TAKE these medications    acetaminophen 325 MG tablet Commonly known as: TYLENOL Take 1-2 tablets (325-650 mg total) by mouth every 4 (four) hours as needed for mild pain. What changed:  how much to take when to take this   amoxicillin-clavulanate 500-125 MG tablet Commonly known as: AUGMENTIN Take 1 tablet by mouth 2 (two) times daily for 25 days.   atorvastatin 40 MG tablet Commonly known as: LIPITOR Take 1 tablet (40 mg total) by mouth daily. Courtesy fill/ pt to get established with a primary MD for further refills   clopidogrel 75 MG tablet Commonly known as: PLAVIX Take 1 tablet (75 mg total) by mouth daily.   Dexcom G6 Sensor Misc Change sensor every 10 days as directed   diclofenac Sodium 1 % Gel Commonly known as: Voltaren Arthritis  Pain Apply 2 g topically 4 (four) times daily. What changed:  how much to take when to take this reasons to take this   doxycycline 100 MG tablet Commonly known as: VIBRA-TABS Take 1 tablet (100 mg total) by mouth every 12 (twelve) hours for 25 days.   furosemide 40 MG tablet Commonly known as: LASIX Take 40 mg daily as needed for swelling   hydrALAZINE 25 MG tablet Commonly known as: APRESOLINE Take 0.5 tablets (12.5 mg total) by mouth 2 (two) times daily.   insulin aspart 100 UNIT/ML injection Commonly known as: novoLOG Use with Omnipod for daily dose around 80 units daily   isosorbide mononitrate 20 MG tablet Commonly known as: ISMO Take 2 tablets (40 mg total) by mouth 2 (two) times daily. What changed: how much to take   levothyroxine 125 MCG tablet Commonly known as: SYNTHROID Take 1 tablet (125 mcg total) by mouth daily at 6 (six) AM.    magnesium oxide 400 MG tablet Commonly known as: MAG-OX Take 0.5 tablets (200 mg total) by mouth at bedtime. What changed:  how much to take when to take this   metoprolol succinate 100 MG 24 hr tablet Commonly known as: TOPROL-XL Take 1 tablet (100 mg total) by mouth daily. Take with or immediately following a meal. Start taking on: August 06, 2022 What changed:  medication strength how much to take additional instructions   multivitamin with minerals Tabs tablet Take 1 tablet by mouth daily.   nitroGLYCERIN 0.4 MG SL tablet Commonly known as: NITROSTAT Place 1 tablet (0.4 mg total) under the tongue every 5 (five) minutes x 3 doses as needed for chest pain.   Omnipod 5 G6 Pods (Gen 5) Misc Change pod every 48-72 hours   Omnipod 5 G6 Pods (Gen 5) Misc Change pod every 48-72 hours   oxyCODONE 5 MG immediate release tablet Commonly known as: Oxy IR/ROXICODONE Take 1 tablet (5 mg total) by mouth every 6 (six) hours as needed for severe pain.   pantoprazole 40 MG tablet Commonly known as: PROTONIX Take 1 tablet (40 mg total) by mouth daily. Courtesy fill/ pt to get established with a primary MD for further refills What changed:  when to take this reasons to take this   polyethylene glycol 17 g packet Commonly known as: MIRALAX / GLYCOLAX Take 17 g by mouth 2 (two) times daily. What changed:  when to take this reasons to take this   ranolazine 1000 MG SR tablet Commonly known as: RANEXA Take 1 tablet (1,000 mg total) by mouth 2 (two) times daily. What changed:  medication strength how much to take   Santyl 250 UNIT/GM ointment Generic drug: collagenase Apply 1 Application topically daily.   tiZANidine 4 MG tablet Commonly known as: ZANAFLEX Take 1 tablet (4 mg total) by mouth at bedtime. Courtesy fill/ pt to get established with a primary MD for further refills   traZODone 100 MG tablet Commonly known as: DESYREL Take 1 tablet (100 mg total) by mouth at  bedtime.   warfarin 5 MG tablet Commonly known as: COUMADIN Take as directed. If you are unsure how to take this medication, talk to your nurse or doctor. Original instructions: TAKE ONE TABLET BY MOUTH DAILY EXCEPT 1 AND 1/2 TABLET ON WEDNESDAYS AND SATURDAYS OR AS DIRECTED What changed:  how much to take how to take this when to take this               Discharge Care Instructions  (  From admission, onward)           Start     Ordered   08/05/22 0000  Discharge wound care:       Comments: Follow up wound care as advised.   08/05/22 1047            Follow-up Information     Johnette Abraham, MD. Go on 08/11/2022.   Specialty: Internal Medicine Why: @2 :20pm Contact information: [address] [place] [address] [phone]         Leonie Man, MD Follow up in 2 week(s).   Specialty: Cardiology Contact information: [address] [place] [address] [place] [address] [phone]                Allergies  Allergen Reactions   Visipaque [Iodixanol] Nausea And Vomiting    Patient had vagal response during procedure became hypertensive, and bradycardic. CO2 used during procedure prior to contrast being used, this may be cause as well. Recommended premedicating prior to contrast being used in the future.    Wellbutrin [Bupropion] Hives   Cefepime Rash    Tolerates penicilllin   Ciprofloxacin Hcl Hives and Rash    Hives/rash at injection site; 01/15/22 tolerated IV cipro   Tape Rash    Consultations: Cardiology   Procedures/Studies: CT ABDOMEN PELVIS WO CONTRAST  Result Date: 08/03/2022 CLINICAL DATA:  42 year old female with history of diarrhea. Chest pain and shortness of breath. EXAM: CT ABDOMEN AND PELVIS WITHOUT CONTRAST TECHNIQUE: Multidetector CT imaging of the abdomen and pelvis was performed following the standard protocol without IV contrast. RADIATION DOSE REDUCTION: This exam was performed according to the departmental  dose-optimization program which includes automated exposure control, adjustment of the mA and/or kV according to patient size and/or use of iterative reconstruction technique. COMPARISON:  CT the abdomen and pelvis 06/23/2014. FINDINGS: Lower chest: Patchy areas of ground-glass attenuation and interlobular septal thickening noted throughout the visualize lung bases, favored to reflect some mild interstitial pulmonary edema. Mild cardiomegaly with mild left ventricular dilatation. Low-attenuation of the intravascular compartment, concerning for anemia. Atherosclerotic calcifications in the left circumflex and right coronary arteries. Multiple small pulmonary nodules noted in the periphery of the visualized lung bases bilaterally, generally 5 mm or less in size and stable compared to the prior study. The exception to this is a 9 x 11 mm right lower lobe pulmonary nodule (axial image 7 of series 5) which previously measured 7 x 6 mm. Hepatobiliary: No suspicious cystic or solid hepatic lesions are confidently identified on today's noncontrast CT examination. Status post cholecystectomy. Pancreas: No definite pancreatic mass or peripancreatic fluid collections or inflammatory changes are noted on today's noncontrast CT examination. Spleen: Unremarkable. Adrenals/Urinary Tract: Multiple tiny 1-4 mm calcifications associated with both renal hila appear to be vascular, although nonobstructive calculi are difficult to entirely exclude. No calcifications are noted along the course of either ureter or within the lumen of the urinary bladder. No hydroureteronephrosis. Unenhanced appearance of the kidneys and bilateral adrenal glands is otherwise normal. Urinary bladder is moderately distended but otherwise unremarkable in appearance. Stomach/Bowel: Unenhanced appearance of the stomach is normal. No pathologic dilatation of small bowel or colon. The appendix is normal in size without surrounding inflammatory changes. Multiple  small appendicoliths are incidentally noted. Vascular/Lymphatic: Atherosclerosis of the abdominal aorta and pelvic vasculature. No lymphadenopathy noted in the abdomen or pelvis. Reproductive: Unenhanced appearance of the uterus and ovaries is unremarkable. Other: No significant volume of ascites.  No pneumoperitoneum. Musculoskeletal: Numerous rounded areas of soft tissue prominence and architectural distortion are noted in the subcutaneous fat of the anterior abdominal wall bilaterally, likely injection granulomas. There are no aggressive appearing lytic or blastic lesions noted in the visualized portions of the skeleton. IMPRESSION: 1. No definite acute findings are noted in the abdomen or pelvis to account for the patient's symptoms. 2. Multiple pulmonary nodules generally stable compared to the prior examination, with exception of an enlarging 9 x 11 mm macrolobulated nodule in the posterior aspect of the right lower lobe. Further evaluation with follow-up nonemergent chest CT is recommended in the near future to better evaluate the full extent of nodularity in the lungs and establish a baseline for follow-up examinations, as the possibility of neoplasm is not excluded. 3. Low attenuation of the intravascular compartment concerning for anemia. 4. The appearance of the lung bases is concerning for mild interstitial pulmonary edema. Given the cardiomegaly and mild left ventricular dilatation, clinical correlation for signs and symptoms of congestive heart failure is recommended. 5. Aortic atherosclerosis, in addition to at least 2 vessel coronary artery disease. Please note that although the presence of coronary artery calcium documents the presence of coronary artery disease, the severity of this disease and any potential stenosis cannot be assessed on this non-gated CT examination. Assessment for potential risk factor modification, dietary therapy or pharmacologic therapy may be warranted, if clinically  indicated. 6. Additional incidental findings, as above. Electronically Signed   By: Vinnie Langton M.D.   On: 08/03/2022 06:36   DG Chest 2 View  Result Date: 08/02/2022 CLINICAL DATA:  Sob, chest pain EXAM: CHEST - 2 VIEW COMPARISON:  07/19/2022 FINDINGS: The heart size and mediastinal contours are within normal limits. There is diffuse pulmonary interstitial prominence which could be seen with atypical infection, edema or interstitial lung disease. There is no focal consolidation. No pneumothorax or pleural effusion. The visualized osseous structures are unremarkable. IMPRESSION: Nonspecific interstitial prominence consistent with atypical infection, edema or interstitial lung disease. No focal consolidation. Electronically Signed   By: Sammie Bench M.D.   On: 08/02/2022 20:39   CARDIAC CATHETERIZATION  Result Date: 07/26/2022   Ost LAD to Prox LAD lesion is 75% stenosed.   Prox LAD to Mid LAD lesion is 100% stenosed.   Prox Cx to Dist Cx lesion is 35% stenosed with 35% stenosed side branch in LPAV.   Prox RCA lesion is 95% stenosed.   Mid RCA lesion is 95% stenosed.   Dist RCA lesion is 100% stenosed.   LV end diastolic pressure is mildly elevated. 2 vessel occlusive CAD. The LAD is occluded after a small diagonal. The RCA is small and diffusely diseased. It is occluded distally Mildly elevated LVEDP Plan: medical management. No suitable targets for revascularization.   ECHOCARDIOGRAM COMPLETE  Result Date: 07/24/2022    ECHOCARDIOGRAM REPORT   Patient Name:   Lauren [name] Date of Exam: 07/24/2022 Medical Rec #:  [id number]       Height:       65.0 in Accession #:    [id number]      Weight:       195.0 lb Date of Birth:  06-30-1980       BSA:          1.957 m Patient Age:    42 years        BP:           144/79 mmHg Patient Gender: F  HR:           97 bpm. Exam Location:  Inpatient Procedure: 2D Echo, Cardiac Doppler, Color Doppler, 3D Echo and Intracardiac            Opacification  Agent Indications:    Chest pain R07.9  History:        Patient has prior history of Echocardiogram examinations, most                 recent 01/14/2022. Carotid Disease, PAD, CKD4 and Stroke; Risk                 Factors:Hypertension, Dyslipidemia and Former Smoker.  Sonographer:    Wilkie Aye RVT RCS Referring Phys: [place]  Sonographer Comments: Technically challenging study due to limited acoustic windows, Technically difficult study due to poor echo windows, suboptimal apical window and suboptimal parasternal window. IMPRESSIONS  1. Left ventricular ejection fraction, by estimation, is 30 to 35%. The left ventricle has moderately decreased function. The left ventricle demonstrates regional wall motion abnormalities (see scoring diagram/findings for description). There is moderate left ventricular hypertrophy. Left ventricular diastolic parameters are indeterminate.  2. Right ventricular systolic function is mildly reduced. The right ventricular size is normal. There is mildly elevated pulmonary artery systolic pressure. The estimated right ventricular systolic pressure is Q000111Q mmHg.  3. Left atrial size was moderately dilated.  4. The mitral valve is grossly normal. Mild to moderate mitral valve regurgitation.  5. The aortic valve was not well visualized. Aortic valve regurgitation is not visualized. Aortic valve mean gradient measures 2.0 mmHg.  6. The inferior vena cava is normal in size with greater than 50% respiratory variability, suggesting right atrial pressure of 3 mmHg. Comparison(s): Prior images reviewed side by side. LVEF has decreased in comparison, 30-35% range with wall motion abnormalities. Mitral regurgitation mild to moderate. FINDINGS  Left Ventricle: Left ventricular ejection fraction, by estimation, is 30 to 35%. The left ventricle has moderately decreased function. The left ventricle demonstrates regional wall motion abnormalities. Definity contrast agent was given IV to  delineate the left ventricular endocardial borders. The left ventricular internal cavity size was normal in size. There is moderate left ventricular hypertrophy. Left ventricular diastolic parameters are indeterminate.  LV Wall Scoring: The mid and distal anterior septum, apical lateral segment, apical inferior segment, and apex are akinetic. The inferior wall, basal anteroseptal segment, mid inferolateral segment, mid inferoseptal segment, apical anterior segment, and basal inferoseptal segment are hypokinetic. The anterior wall, antero-lateral wall, and basal inferolateral segment are normal. Right Ventricle: The right ventricular size is normal. No increase in right ventricular wall thickness. Right ventricular systolic function is mildly reduced. There is mildly elevated pulmonary artery systolic pressure. The tricuspid regurgitant velocity  is 2.92 m/s, and with an assumed right atrial pressure of 3 mmHg, the estimated right ventricular systolic pressure is Q000111Q mmHg. Left Atrium: Left atrial size was moderately dilated. Right Atrium: Right atrial size was normal in size. Pericardium: There is no evidence of pericardial effusion. Mitral Valve: The mitral valve is grossly normal. Mild to moderate mitral valve regurgitation. Tricuspid Valve: The tricuspid valve is grossly normal. Tricuspid valve regurgitation is trivial. Aortic Valve: The aortic valve was not well visualized. Aortic valve regurgitation is not visualized. Aortic valve mean gradient measures 2.0 mmHg. Aortic valve peak gradient measures 2.9 mmHg. Aortic valve area, by VTI measures 2.27 cm. Pulmonic Valve: The pulmonic valve was not well visualized. Pulmonic valve regurgitation is trivial. Aorta: The aortic  root is normal in size and structure. Venous: The inferior vena cava is normal in size with greater than 50% respiratory variability, suggesting right atrial pressure of 3 mmHg. IAS/Shunts: No atrial level shunt detected by color flow Doppler.   LEFT VENTRICLE PLAX 2D LVIDd:         5.20 cm      Diastology LVIDs:         4.20 cm      LV e' medial:   5.63 cm/s LV PW:         1.80 cm      LV E/e' medial: 17.7 LV IVS:        1.10 cm LVOT diam:     1.70 cm LV SV:         35 LV SV Index:   18 LVOT Area:     2.27 cm  LV Volumes (MOD) LV vol d, MOD A2C: 202.0 ml LV vol s, MOD A2C: 133.0 ml LV SV MOD A2C:     69.0 ml RIGHT VENTRICLE             IVC RV Basal diam:  3.60 cm     IVC diam: 2.10 cm RV S prime:     10.50 cm/s TAPSE (M-mode): 1.5 cm LEFT ATRIUM             Index        RIGHT ATRIUM           Index LA diam:        4.10 cm 2.10 cm/m   RA Area:     13.00 cm LA Vol (A2C):   65.3 ml 33.37 ml/m  RA Volume:   27.30 ml  13.95 ml/m LA Vol (A4C):   88.8 ml 45.38 ml/m LA Biplane Vol: 76.4 ml 39.04 ml/m  AORTIC VALVE                    PULMONIC VALVE AV Area (Vmax):    2.49 cm     PV Vmax:       0.71 m/s AV Area (Vmean):   2.43 cm     PV Peak grad:  2.0 mmHg AV Area (VTI):     2.27 cm AV Vmax:           84.70 cm/s AV Vmean:          56.300 cm/s AV VTI:            0.156 m AV Peak Grad:      2.9 mmHg AV Mean Grad:      2.0 mmHg LVOT Vmax:         93.00 cm/s LVOT Vmean:        60.300 cm/s LVOT VTI:          0.156 m LVOT/AV VTI ratio: 1.00  AORTA Ao Root diam: 2.80 cm Ao Asc diam:  3.10 cm Ao Arch diam: 2.9 cm MITRAL VALVE               TRICUSPID VALVE MV Area (PHT): 6.22 cm    TR Peak grad:   34.1 mmHg MV Decel Time: 122 msec    TR Vmax:        292.00 cm/s MV E velocity: 99.70 cm/s MV A velocity: 47.50 cm/s  SHUNTS MV E/A ratio:  2.10        Systemic VTI:  0.16 m  Systemic Diam: 1.70 cm Rozann Lesches MD Electronically signed by Rozann Lesches MD Signature Date/Time: 07/24/2022/5:07:35 PM    Final    DG Foot Complete Left  Result Date: 07/21/2022 CLINICAL DATA:  Osteomyelitis.  Postoperative evaluation. EXAM: LEFT FOOT - COMPLETE 3+ VIEW COMPARISON:  Left foot radiographs 07/19/2022 and 01/30/2022 FINDINGS: Interval amputation  of the remaining prior 50% of the fifth metatarsal. Expected postoperative subcutaneous air. Subacute partially healed fracture of the distal shaft of the fourth metatarsal with partial healed sclerosis and moderate peripheral callus formation. On lateral view there appears to be approximately 6 mm dorsal displacement of the distal fracture component with respect to the proximal fracture component (slightly greater than 1 bone width). IMPRESSION: 1. Interval amputation of the remaining prior 50% of the fifth metatarsal. No unexpected findings. 2. Subacute partially healed and mildly displaced fracture of the distal shaft of the fourth metatarsal. Electronically Signed   By: Yvonne Kendall M.D.   On: 07/21/2022 17:35   VAS [name] LOWER EXTREMITY ARTERIAL DUPLEX  Result Date: 07/20/2022 LOWER EXTREMITY ARTERIAL DUPLEX STUDY Patient Name:  Lauren [name]  Date of Exam:   07/20/2022 Medical Rec #: [id number]        Accession #:    [id number] Date of Birth: 20-May-1980        Patient Gender: F Patient Age:   38 years Exam Location:  [facility] Procedure:      VAS [name] LOWER EXTREMITY ARTERIAL DUPLEX Referring Phys: [facility] --------------------------------------------------------------------------------  Indications: Peripheral artery disease, and Diabetic left foot ulcer. High Risk Factors: Diabetes, current smoker, coronary artery disease, prior CVA.  Vascular Interventions: 06/10/22 - Left anterior tibial and dorsalis pedis                         angioplasty with angioplasty and stenting of left mid                         SFA by Dr. Carlis Abbott. Current ABI:            Right 0.93, Left 0.88 Performing Technologist: Velva Harman Sturdivant RDMS, RVT  Examination Guidelines: A complete evaluation includes B-mode imaging, spectral Doppler, color Doppler, and power Doppler as needed of all accessible portions of each vessel. Bilateral testing is considered an integral part of a complete examination. Limited examinations for  reoccurring indications may be performed as noted.   Right Stent(s): +---------------+---++----------++ Prox to Stent  32monophasic +---------------+---++----------++ Proximal Stent 46monophasic +---------------+---++----------++ Mid Stent      65monophasic +---------------+---++----------++ Distal Stent   68monophasic +---------------+---++----------++ Distal to Stent44monophasic +---------------+---++----------++    +-----------+--------+-----+---------------+----------+------------------------+ LEFT       PSV cm/sRatioStenosis       Waveform  Comments                 +-----------+--------+-----+---------------+----------+------------------------+ CFA Mid    87                          monophasic                         +-----------+--------+-----+---------------+----------+------------------------+ CFA Distal 109                         monophasic                         +-----------+--------+-----+---------------+----------+------------------------+  DFA        90                          monophasic                         +-----------+--------+-----+---------------+----------+------------------------+ SFA Prox   135                                                            +-----------+--------+-----+---------------+----------+------------------------+ SFA Mid                                          Mid SFA Stent            +-----------+--------+-----+---------------+----------+------------------------+ SFA Distal 116                         monophasic                         +-----------+--------+-----+---------------+----------+------------------------+ POP Prox   151                         monophasic                         +-----------+--------+-----+---------------+----------+------------------------+ POP Distal 94                          monophasic                          +-----------+--------+-----+---------------+----------+------------------------+ TP Trunk   106                         monophasic                         +-----------+--------+-----+---------------+----------+------------------------+ ATA Prox   57                          monophasic                         +-----------+--------+-----+---------------+----------+------------------------+ ATA Mid    93                          monophasic                         +-----------+--------+-----+---------------+----------+------------------------+ ATA Distal 329          50-74% stenosismonophasic3.5 ratio                +-----------+--------+-----+---------------+----------+------------------------+ PTA Prox   27                          monophasicareas of occlusive  disease with                                                              collaterization          +-----------+--------+-----+---------------+----------+------------------------+ PTA Mid    53                          monophasic                         +-----------+--------+-----+---------------+----------+------------------------+ PTA Distal 58                          monophasic                         +-----------+--------+-----+---------------+----------+------------------------+ PERO Prox  70                          monophasic                         +-----------+--------+-----+---------------+----------+------------------------+ PERO Mid   64                          monophasic                         +-----------+--------+-----+---------------+----------+------------------------+ PERO Distal43                          monophasic                         +-----------+--------+-----+---------------+----------+------------------------+ DP         104                                                             +-----------+--------+-----+---------------+----------+------------------------+  Summary: Left: 50-74% stenosis noted in the anterior tibial artery. Patent left mid SFA stent.  See table(s) above for measurements and observations. Electronically signed by Deitra Mayo MD on 07/20/2022 at 4:41:09 PM.    Final    VAS [name] ABI WITH/WO TBI  Result Date: 07/20/2022  LOWER EXTREMITY DOPPLER STUDY Patient Name:  Lauren [name]  Date of Exam:   07/20/2022 Medical Rec #: [id number]        Accession #:    [id number] Date of Birth: 1980-07-04        Patient Gender: F Patient Age:   3 years Exam Location:  [facility] Procedure:      VAS [name] ABI WITH/WO TBI Referring Phys: [facility] YATES --------------------------------------------------------------------------------  Indications: Peripheral artery disease. Left diabetic foot ulcer. High Risk Factors: Hyperlipidemia, Diabetes, current smoker, coronary artery                    disease.  Vascular Interventions: 06/10/22 - Left anterior tibial and dorsalis pedis  angioplasty with angioplasty and stenting of left mid                         SFA by Dr. Carlis Abbott. Performing Technologist: Oda Cogan RDMS, RVT  Examination Guidelines: A complete evaluation includes at minimum, Doppler waveform signals and systolic blood pressure reading at the level of bilateral brachial, anterior tibial, and posterior tibial arteries, when vessel segments are accessible. Bilateral testing is considered an integral part of a complete examination. Photoelectric Plethysmograph (PPG) waveforms and toe systolic pressure readings are included as required and additional duplex testing as needed. Limited examinations for reoccurring indications may be performed as noted.  ABI Findings: +---------+------------------+-----+---------+----------------+ Right    Rt Pressure (mmHg)IndexWaveform Comment           +---------+------------------+-----+---------+----------------+ Brachial 131                    triphasic                 +---------+------------------+-----+---------+----------------+ ATA      255               1.95 biphasic Non compressible +---------+------------------+-----+---------+----------------+ PTA      123               0.94 biphasic                  +---------+------------------+-----+---------+----------------+ Great Toe88                0.67                           +---------+------------------+-----+---------+----------------+ +--------+------------------+-----+---------+----------------+ Left    Lt Pressure (mmHg)IndexWaveform Comment          +--------+------------------+-----+---------+----------------+ Brachial                       triphasic                 +--------+------------------+-----+---------+----------------+ PTA     115               0.88 biphasic                  +--------+------------------+-----+---------+----------------+ DP      213               1.63 biphasic Non compressible +--------+------------------+-----+---------+----------------+ +-------+-----------+-----------+------------+------------+ ABI/TBIToday's ABIToday's TBIPrevious ABIPrevious TBI +-------+-----------+-----------+------------+------------+ Right  0.94       0.67                                +-------+-----------+-----------+------------+------------+ Left   0.88       NA                                  +-------+-----------+-----------+------------+------------+  Left ABIs appear essentially unchanged compared to prior study on 01/14/22.  Summary: Right: Resting right ankle-brachial index indicates mild right lower extremity arterial disease. The right toe-brachial index is abnormal. Left: Resting left ankle-brachial index indicates mild left lower extremity arterial disease. *See table(s) above for measurements and observations.   Electronically signed by Deitra Mayo MD on 07/20/2022 at 4:40:44 PM.    Final    MR FOOT LEFT WO CONTRAST  Result Date: 07/19/2022 CLINICAL DATA:  Left foot wound EXAM: MRI OF THE  LEFT FOOT WITHOUT CONTRAST TECHNIQUE: Multiplanar, multisequence MR imaging of the left forefoot was performed. No intravenous contrast was administered. COMPARISON:  X-ray 07/19/2022, MRI 01/02/2022 FINDINGS: Bones/Joint/Cartilage Prior transmetatarsal amputation of the fifth ray. 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 Displaced ununited fracture of the distal shaft of the fourth metatarsal without residual bone marrow edema. Minimal bone marrow edema of the fourth metatarsal base, nonspecific and may be reactive. Trace fluid in the fourth and fifth tarsometatarsal joints. Remaining osseous structures are within normal limits. No additional fractures. No dislocation. Ligaments Intact Lisfranc ligament. Remaining collateral ligaments are intact. Muscles and Tendons Denervation changes of the foot musculature.  No tenosynovitis. Soft tissues Wound or ulceration at the lateral foot adjacent to the fifth metatarsal base. Generalized soft tissue swelling and edema. No organized or drainable fluid collections. IMPRESSION: 1. Prior transmetatarsal amputation of the fifth ray. Wound or ulceration at the lateral foot adjacent to the fifth metatarsal base. 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 2. Minimal bone marrow edema of the fourth metatarsal base, nonspecific and may be reactive. Trace fluid in the fourth and fifth tarsometatarsal joints, likely reactive although developing septic arthritis not excluded. 3. Generalized soft tissue edema of the left foot. No organized or drainable fluid collections. 4. Displaced ununited fracture of the  distal shaft of the fourth metatarsal without residual bone marrow edema. Electronically Signed   By: Davina Poke D.O.   On: 07/19/2022 10:33   DG Foot Complete Left  Result Date: 07/19/2022 CLINICAL DATA:  Lateral foot wound, concern for osteomyelitis, pain, previous fifth toe amputation April 2023 EXAM: LEFT FOOT - COMPLETE 3+ VIEW COMPARISON:  01/30/2022 FINDINGS: Previous left fifth toe amputation at the level of the fifth metatarsal mid shaft as before. Lateral foot soft tissue wound over the fifth metatarsal base area. There is underlying fifth metatarsal base cortical irregularity and subcortical lucency when compared to the prior study suspicious for osteomyelitis. Healing left fourth metatarsal fracture with callus formation. Peripheral vascular calcifications noted. No joint abnormality. IMPRESSION: 1. Findings suspicious for left fifth metatarsal base osteomyelitis with overlying soft tissue wound. 2. Healing left fourth metatarsal fracture. 3. Previous left fifth toe amputation. Electronically Signed   By: Jerilynn Mages.  Shick M.D.   On: 07/19/2022 08:47   DG Chest 2 View  Result Date: 07/19/2022 CLINICAL DATA:  42 year old female with history of chest pain. EXAM: CHEST - 2 VIEW COMPARISON:  Chest x-ray 02/08/2022. FINDINGS: Lung volumes are low. No consolidative airspace disease. No pleural effusions. No pneumothorax. No pulmonary nodule or mass noted. Pulmonary vasculature and the cardiomediastinal silhouette are within normal limits. IMPRESSION: 1. Low lung volumes without radiographic evidence of acute cardiopulmonary disease. Electronically Signed   By: Vinnie Langton M.D.   On: 07/19/2022 06:04     Subjective: Patient was seen and examined at bedside.  Overnight events noted.   Patient report doing much better , She wants to be discharged.  Cleared from cardiology for discharge.  Discharge Exam: Vitals:   08/05/22 0449 08/05/22 0807  BP: 134/75 106/75  Pulse: 86 81  Resp: (!) 22 18   Temp: 98 F (36.7 C) 98 F (36.7 C)  SpO2: 97% 97%   Vitals:   08/04/22 2002 08/05/22 0005 08/05/22 0449 08/05/22 0807  BP: 127/76 137/85 134/75 106/75  Pulse: 88 82 86  81  Resp: 18 15 (!) 22 18  Temp: 98.3 F (36.8 C) 98.1 F (36.7 C) 98 F (36.7 C) 98 F (36.7 C)  TempSrc: Oral Oral Oral Oral  SpO2: 100% 100% 97% 97%  Weight:   100.2 kg   Height:        General: Pt is alert, awake, not in acute distress Cardiovascular: RRR, S1/S2 +, no rubs, no gallops Respiratory: CTA bilaterally, no wheezing, no rhonchi Abdominal: Soft, NT, ND, bowel sounds + Extremities: no edema, no cyanosis    The results of significant diagnostics from this hospitalization (including imaging, microbiology, ancillary and laboratory) are listed below for reference.     Microbiology: Recent Results (from the past 240 hour(s))  Resp panel by RT-PCR (RSV, Flu A&B, Covid) Anterior Nasal Swab     Status: None   Collection Time: 08/02/22  8:12 PM   Specimen: Anterior Nasal Swab  Result Value Ref Range Status   SARS Coronavirus 2 by RT PCR NEGATIVE NEGATIVE Final   Influenza A by PCR NEGATIVE NEGATIVE Final   Influenza B by PCR NEGATIVE NEGATIVE Final    Comment: (NOTE) The Xpert Xpress SARS-CoV-2/FLU/RSV plus assay is intended as an aid in the diagnosis of influenza from Nasopharyngeal swab specimens and should not be used as a sole basis for treatment. Nasal washings and aspirates are unacceptable for Xpert Xpress SARS-CoV-2/FLU/RSV testing.  Fact Sheet for Patients: EntrepreneurPulse.com.au  Fact Sheet for Healthcare Providers: IncredibleEmployment.be  This test is not yet approved or cleared by the [place] FDA and has been authorized for detection and/or diagnosis of SARS-CoV-2 by FDA under an Emergency Use Authorization (EUA). This EUA will remain in effect (meaning this test can be used) for the duration of the COVID-19 declaration under Section  564(b)(1) of the Act, 21 U.S.C. section 360bbb-3(b)(1), unless the authorization is terminated or revoked.     Resp Syncytial Virus by PCR NEGATIVE NEGATIVE Final    Comment: (NOTE) Fact Sheet for Patients: EntrepreneurPulse.com.au  Fact Sheet for Healthcare Providers: IncredibleEmployment.be  This test is not yet approved or cleared by the [place] FDA and has been authorized for detection and/or diagnosis of SARS-CoV-2 by FDA under an Emergency Use Authorization (EUA). This EUA will remain in effect (meaning this test can be used) for the duration of the COVID-19 declaration under Section 564(b)(1) of the Act, 21 U.S.C. section 360bbb-3(b)(1), unless the authorization is terminated or revoked.  Performed at [facility], [place] [address], [place], [place] [address]   C Difficile Quick Screen w PCR reflex     Status: None   Collection Time: 08/03/22 12:22 AM   Specimen: STOOL  Result Value Ref Range Status   C Diff antigen NEGATIVE NEGATIVE Final   C Diff toxin NEGATIVE NEGATIVE Final   C Diff interpretation No C. difficile detected.  Final    Comment: Performed at [facility], [place] [address], [place], [place] [address]  Gastrointestinal Panel by PCR , Stool     Status: None   Collection Time: 08/03/22 12:22 AM   Specimen: STOOL  Result Value Ref Range Status   Campylobacter species NOT DETECTED NOT DETECTED Final   Plesimonas shigelloides NOT DETECTED NOT DETECTED Final   Salmonella species NOT DETECTED NOT DETECTED Final   Yersinia enterocolitica NOT DETECTED NOT DETECTED Final   Vibrio species NOT DETECTED NOT DETECTED Final   Vibrio cholerae NOT DETECTED NOT DETECTED Final   Enteroaggregative E coli (EAEC) NOT DETECTED NOT  DETECTED Final   Enteropathogenic E coli (EPEC) NOT DETECTED NOT DETECTED Final   Enterotoxigenic E coli (ETEC) NOT DETECTED NOT DETECTED Final   Shiga like toxin producing E coli (STEC) NOT  DETECTED NOT DETECTED Final   Shigella/Enteroinvasive E coli (EIEC) NOT DETECTED NOT DETECTED Final   Cryptosporidium NOT DETECTED NOT DETECTED Final   Cyclospora cayetanensis NOT DETECTED NOT DETECTED Final   Entamoeba histolytica NOT DETECTED NOT DETECTED Final   Giardia lamblia NOT DETECTED NOT DETECTED Final   Adenovirus F40/41 NOT DETECTED NOT DETECTED Final   Astrovirus NOT DETECTED NOT DETECTED Final   Norovirus GI/GII NOT DETECTED NOT DETECTED Final   Rotavirus A NOT DETECTED NOT DETECTED Final   Sapovirus (I, II, IV, and V) NOT DETECTED NOT DETECTED Final    Comment: Performed at [facility], [address] [place], [place] [address]     Labs: BNP (last 3 results) Recent Labs    07/26/22 0335 07/27/22 0548 08/02/22 2023  BNP 1,226.3* 2,632.3* [id number]*   Basic Metabolic Panel: Recent Labs  Lab 08/02/22 2023 08/04/22 0109 08/05/22 0041  NA 140 139 136  K 4.4 4.7 5.0  CL 108 110 107  CO2 22 22 20*  GLUCOSE 203* 139* 135*  BUN 30* 23* 30*  CREATININE 2.57* 2.13* 2.36*  CALCIUM 8.1* 8.1* 7.9*   Liver Function Tests: Recent Labs  Lab 08/02/22 2235  AST 25  ALT 22  ALKPHOS 95  BILITOT 0.3  PROT 6.2*  ALBUMIN 2.4*   No results for input(s): "LIPASE", "AMYLASE" in the last 168 hours. No results for input(s): "AMMONIA" in the last 168 hours. CBC: Recent Labs  Lab 08/02/22 2023 08/04/22 0109 08/05/22 0041  WBC 10.2 11.5* 10.1  HGB 9.0* 8.9* 8.9*  HCT 29.9* 28.4* 29.6*  MCV 92.0 88.2 91.6  PLT 518* 493* 480*   Cardiac Enzymes: No results for input(s): "CKTOTAL", "CKMB", "CKMBINDEX", "TROPONINI" in the last 168 hours. BNP: Invalid input(s): "POCBNP" CBG: Recent Labs  Lab 08/04/22 1109 08/04/22 1622 08/04/22 2112 08/05/22 0433 08/05/22 0831  GLUCAP 109* 178* 129* 148* 139*   D-Dimer No results for input(s): "DDIMER" in the last 72 hours. Hgb A1c No results for input(s): "HGBA1C" in the last 72 hours. Lipid Profile No results for  input(s): "CHOL", "HDL", "LDLCALC", "TRIG", "CHOLHDL", "LDLDIRECT" in the last 72 hours. Thyroid function studies No results for input(s): "TSH", "T4TOTAL", "T3FREE", "THYROIDAB" in the last 72 hours.  Invalid input(s): "FREET3" Anemia work up No results for input(s): "VITAMINB12", "FOLATE", "FERRITIN", "TIBC", "IRON", "RETICCTPCT" in the last 72 hours. Urinalysis    Component Value Date/Time   COLORURINE YELLOW 01/09/2022 0225   APPEARANCEUR CLEAR 01/09/2022 0225   LABSPEC 1.045 (H) 01/09/2022 0225   PHURINE 7.0 01/09/2022 0225   GLUCOSEU 50 (A) 01/09/2022 0225   HGBUR NEGATIVE 01/09/2022 0225   BILIRUBINUR NEGATIVE 01/09/2022 0225   KETONESUR NEGATIVE 01/09/2022 0225   PROTEINUR >=300 (A) 01/09/2022 0225   UROBILINOGEN 0.2 06/23/2014 1227   NITRITE NEGATIVE 01/09/2022 0225   LEUKOCYTESUR TRACE (A) 01/09/2022 0225   Sepsis Labs Recent Labs  Lab 08/02/22 2023 08/04/22 0109 08/05/22 0041  WBC 10.2 11.5* 10.1   Microbiology Recent Results (from the past 240 hour(s))  Resp panel by RT-PCR (RSV, Flu A&B, Covid) Anterior Nasal Swab     Status: None   Collection Time: 08/02/22  8:12 PM   Specimen: Anterior Nasal Swab  Result Value Ref Range Status   SARS Coronavirus 2 by RT PCR NEGATIVE  NEGATIVE Final   Influenza A by PCR NEGATIVE NEGATIVE Final   Influenza B by PCR NEGATIVE NEGATIVE Final    Comment: (NOTE) The Xpert Xpress SARS-CoV-2/FLU/RSV plus assay is intended as an aid in the diagnosis of influenza from Nasopharyngeal swab specimens and should not be used as a sole basis for treatment. Nasal washings and aspirates are unacceptable for Xpert Xpress SARS-CoV-2/FLU/RSV testing.  Fact Sheet for Patients: EntrepreneurPulse.com.au  Fact Sheet for Healthcare Providers: IncredibleEmployment.be  This test is not yet approved or cleared by the [place] FDA and has been authorized for detection and/or diagnosis of SARS-CoV-2 by FDA  under an Emergency Use Authorization (EUA). This EUA will remain in effect (meaning this test can be used) for the duration of the COVID-19 declaration under Section 564(b)(1) of the Act, 21 U.S.C. section 360bbb-3(b)(1), unless the authorization is terminated or revoked.     Resp Syncytial Virus by PCR NEGATIVE NEGATIVE Final    Comment: (NOTE) Fact Sheet for Patients: EntrepreneurPulse.com.au  Fact Sheet for Healthcare Providers: IncredibleEmployment.be  This test is not yet approved or cleared by the [place] FDA and has been authorized for detection and/or diagnosis of SARS-CoV-2 by FDA under an Emergency Use Authorization (EUA). This EUA will remain in effect (meaning this test can be used) for the duration of the COVID-19 declaration under Section 564(b)(1) of the Act, 21 U.S.C. section 360bbb-3(b)(1), unless the authorization is terminated or revoked.  Performed at [facility], [place] [address] [place], [place] [address]   C Difficile Quick Screen w PCR reflex     Status: None   Collection Time: 08/03/22 12:22 AM   Specimen: STOOL  Result Value Ref Range Status   C Diff antigen NEGATIVE NEGATIVE Final   C Diff toxin NEGATIVE NEGATIVE Final   C Diff interpretation No C. difficile detected.  Final    Comment: Performed at [facility], [place] [address], [place], [place] [address]  Gastrointestinal Panel by PCR , Stool     Status: None   Collection Time: 08/03/22 12:22 AM   Specimen: STOOL  Result Value Ref Range Status   Campylobacter species NOT DETECTED NOT DETECTED Final   Plesimonas shigelloides NOT DETECTED NOT DETECTED Final   Salmonella species NOT DETECTED NOT DETECTED Final   Yersinia enterocolitica NOT DETECTED NOT DETECTED Final   Vibrio species NOT DETECTED NOT DETECTED Final   Vibrio cholerae NOT DETECTED NOT DETECTED Final   Enteroaggregative E coli (EAEC) NOT DETECTED NOT DETECTED Final    Enteropathogenic E coli (EPEC) NOT DETECTED NOT DETECTED Final   Enterotoxigenic E coli (ETEC) NOT DETECTED NOT DETECTED Final   Shiga like toxin producing E coli (STEC) NOT DETECTED NOT DETECTED Final   Shigella/Enteroinvasive E coli (EIEC) NOT DETECTED NOT DETECTED Final   Cryptosporidium NOT DETECTED NOT DETECTED Final   Cyclospora cayetanensis NOT DETECTED NOT DETECTED Final   Entamoeba histolytica NOT DETECTED NOT DETECTED Final   Giardia lamblia NOT DETECTED NOT DETECTED Final   Adenovirus F40/41 NOT DETECTED NOT DETECTED Final   Astrovirus NOT DETECTED NOT DETECTED Final   Norovirus GI/GII NOT DETECTED NOT DETECTED Final   Rotavirus A NOT DETECTED NOT DETECTED Final   Sapovirus (I, II, IV, and V) NOT DETECTED NOT DETECTED Final    Comment: Performed at [facility], [address], [place], [place] [address]     Time coordinating discharge: Over 30 minutes  SIGNED:   Duard Brady, MD  [facility] 08/05/2022, 3:11  PM Pager   If 7PM-7AM, please contact night-coverage

## 2022-08-05 NOTE — Progress Notes (Signed)
[facility] for heparin Indication: chest pain/ACS  Allergies  Allergen Reactions   Visipaque [Iodixanol] Nausea And Vomiting    Patient had vagal response during procedure became hypertensive, and bradycardic. CO2 used during procedure prior to contrast being used, this may be cause as well. Recommended premedicating prior to contrast being used in the future.    Wellbutrin [Bupropion] Hives   Cefepime Rash    Tolerates penicilllin   Ciprofloxacin Hcl Hives and Rash    Hives/rash at injection site; 01/15/22 tolerated IV cipro   Tape Rash    Patient Measurements: Height: 5\' 5"  (165.1 cm) Weight: 100.2 kg (220 lb 14.4 oz) IBW/kg (Calculated) : 57 Heparin Dosing Weight: 80kg  Vital Signs: Temp: 98 F (36.7 C) (04/04 0449) Temp Source: Oral (04/04 0449) BP: 134/75 (04/04 0449) Pulse Rate: 86 (04/04 0449)  Labs: Recent Labs    08/02/22 2023 08/02/22 2023 08/02/22 2235 08/03/22 1046 08/03/22 1931 08/03/22 2126 08/04/22 0109 08/04/22 1303 08/05/22 0041  HGB 9.0*  --   --   --   --   --  8.9*  --  8.9*  HCT 29.9*  --   --   --   --   --  28.4*  --  29.6*  PLT 518*  --   --   --   --   --  493*  --  480*  LABPROT  --    < > 22.8* 22.5*  --   --  19.3*  --  19.1*  INR  --    < > 2.0* 2.0*  --   --  1.6*  --  1.6*  HEPARINUNFRC  --   --   --   --   --   --  0.23* <0.10*  --   CREATININE 2.57*  --   --   --   --   --  2.13*  --  2.36*  TROPONINIHS 387*  --  395*  --  304* 234*  --   --   --    < > = values in this interval not displayed.     Estimated Creatinine Clearance: 37.2 mL/min (A) (by C-G formula based on SCr of 2.36 mg/dL (H)).   Medical History: Past Medical History:  Diagnosis Date   Anemia    CAD (coronary artery disease)    a. s/p cath in 03/2014 showing 30% mid-LAD, moderate to severe disease along small D1, patent LCx, moderate to severe distal OM2 stenosis and moderate diffuse diease along RCA not amenable to PCI    CHF (congestive heart failure)    a. EF 55-60% in 12/2019 b. EF at 35-40% by echo in 05/2020   CKD (chronic kidney disease) stage 4, GFR 15-29 ml/min    Diabetes mellitus without complication    Myocardial infarction    Neuropathy    PVD (peripheral vascular disease)    Stroke 01/2022   L hand weakness     Assessment: 42 yo W on warfarin for CVA with admit INR of 2.0. Patient admitted for chest pain unresponsive to nitroglycerin likely due to NSTEMI in setting of diarrhea. Pharmacy consulted for heparin.    Heparin stopped yesterday due to no longer having chest pain. INR today remains unchanged and subtherapeutic at 1.6. Had missed warfarin dose on 4/1 so likely contributing. Hgb 8.9, plt 480. No s/sx of bleeding.   PTA regimen 5 mg daily except 7.5 mg Sat   Goal of Therapy:  INR 2-3 Heparin level 0.3-0.7 units/ml Monitor platelets by anticoagulation protocol: Yes   Plan:  Order warfarin 6 mg tonight  Restart PTA regimen at discharge  Monitor daily INR and CBC Monitor for signs/symptoms of bleeding   Thank you for allowing pharmacy to participate in this patient's care,  Antonietta Jewel, PharmD, [place] Pharmacist  Phone: [phone] 08/05/2022 7:19 AM  Please check AMION for all [place] phone numbers After 10:00 PM, call [place] [phone]

## 2022-08-05 NOTE — TOC Transition Note (Signed)
Transition of Care [facility]) - CM/SW Discharge Note   Patient Details  Name: Lauren [name] MRN: [id number] Date of Birth: 1980/07/16  Transition of Care [facility]) CM/SW Contact:  Zenon Mayo, RN Phone Number: 08/05/2022, 11:05 AM   Clinical Narrative:    Patient is for dc today, husband is at the bedside, he will transport her home today, she has a walker and a w/chair at home.  Patient has her ortho boot on right now. She has no other TOC needs.   Final next level of care: Home/Self Care Barriers to Discharge: No Barriers Identified   Patient Goals and CMS Choice   Choice offered to / list presented to : NA  Discharge Placement                         Discharge Plan and Services Additional resources added to the After Visit Summary for                  DME Arranged: N/A         HH Arranged: NA          Social Determinants of Health (SDOH) Interventions SDOH Screenings   Food Insecurity: No Food Insecurity (08/03/2022)  Housing: Low Risk  (08/03/2022)  Transportation Needs: No Transportation Needs (08/03/2022)  Utilities: Not At Risk (08/03/2022)  Alcohol Screen: Low Risk  (03/03/2022)  Depression (PHQ2-9): Low Risk  (07/28/2022)  Financial Resource Strain: Low Risk  (03/03/2022)  Recent Concern: Financial Resource Strain - High Risk (01/14/2022)  Physical Activity: Inactive (03/03/2022)  Social Connections: Socially Integrated (03/03/2022)  Stress: No Stress Concern Present (03/03/2022)  Recent Concern: Stress - Stress Concern Present (12/22/2021)  Tobacco Use: Medium Risk (08/03/2022)     Readmission Risk Interventions     No data to display

## 2022-08-05 NOTE — TOC Initial Note (Signed)
Transition of Care [facility]) - Initial/Assessment Note    Patient Details  Name: Lauren [name] MRN: [id number] Date of Birth: 09-Oct-1980  Transition [facility] Care [facility]) CM/SW Contact:    Zenon Mayo, RN Phone Number: 08/05/2022, 11:11 AM  Clinical Narrative:                 For dc today, spouse at bedside, no needs.  Expected Discharge Plan: Home/Self Care Barriers to Discharge: No Barriers Identified   Patient Goals and CMS Choice Patient states their goals for this hospitalization and ongoing recovery are:: return home   Choice offered to / list presented to : NA      Expected Discharge Plan and Services In-house Referral: NA Discharge Planning Services: CM Consult Post Acute Care Choice: NA Living arrangements for the past 2 months: Single Family Home Expected Discharge Date: 08/05/22               DME Arranged: N/A DME Agency: NA       HH Arranged: NA          Prior Living Arrangements/Services Living arrangements for the past 2 months: Single Family Home Lives with:: Spouse Patient language and need for interpreter reviewed:: Yes Do you feel safe going back to the place where you live?: Yes      Need for Family Participation in Patient Care: Yes (Comment) Care giver support system in place?: Yes (comment) Current home services: DME (rolling walker and w/chair) Criminal Activity/Legal Involvement Pertinent to Current Situation/Hospitalization: No - Comment as needed  Activities of Daily Living Home Assistive Devices/Equipment: CBG Meter ADL Screening (condition at time of admission) Patient's cognitive ability adequate to safely complete daily activities?: Yes Is the patient deaf or have difficulty hearing?: No Does the patient have difficulty seeing, even when wearing glasses/contacts?: No Does the patient have difficulty concentrating, remembering, or making decisions?: No Patient able to express need for assistance with ADLs?: Yes Does the patient  have difficulty dressing or bathing?: No Independently performs ADLs?: Yes (appropriate for developmental age) Does the patient have difficulty walking or climbing stairs?: Yes Weakness of Legs: Left Weakness of Arms/Hands: Left  Permission Sought/Granted                  Emotional Assessment Appearance:: Appears stated age Attitude/Demeanor/Rapport: Engaged Affect (typically observed): Appropriate Orientation: : Oriented to Self, Oriented to Place, Oriented to  Time, Oriented to Situation Alcohol / Substance Use: Not Applicable Psych Involvement: No (comment)  Admission diagnosis:  Chest pain [R07.9] Chest pain, unspecified type [R07.9] Diarrhea, unspecified type [R19.7] NSTEMI (non-ST elevated myocardial infarction) [I21.4] Patient Active Problem List   Diagnosis Date Noted   Elevated troponin level not due myocardial infarction -> level still declining from previous infarction 08/03/2022   Diarrhea 08/03/2022   Nausea 08/02/2022   Osteomyelitis 07/20/2022   Diabetic foot infection 07/19/2022   PVD (peripheral vascular disease) 07/19/2022   DNR (do not resuscitate) 07/19/2022   Critical limb ischemia of left lower extremity 06/01/2022   Open abdominal wall wound, initial encounter 04/28/2022   Insomnia 04/07/2022   Encounter for therapeutic drug monitoring 04/07/2022   Ulcer of left foot due to type 2 diabetes mellitus    Acute on chronic combined systolic and diastolic CHF (congestive heart failure)    Ischemic cardiomyopathy    Acute right MCA stroke 01/28/2022   Elevated troponin    Aspiration pneumonia 01/18/2022   Acute respiratory failure with hypoxia    HFrEF (heart failure with  reduced ejection fraction)    Right carotid artery occlusion 01/08/2022   Internal carotid artery occlusion, 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   Menorrhagia with irregular cycle 12/15/2021   Pregnancy examination or test, negative result 12/15/2021   Encounter for IUD insertion 12/15/2021    Critical ischemia of upper extremity 11/18/2021   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 11/18/2021   Cellulitis of left foot 09/02/2021   Acute osteomyelitis of left foot 09/02/2021   Foot ulcer 08/27/2021   Sepsis 08/27/2021   Hypokalemia 08/27/2021   Cellulitis 06/18/2021   Chronic combined systolic and diastolic heart failure 0000000   History of CVA (cerebrovascular accident) 06/18/2021   Encounter for cervical Pap smear with pelvic exam 07/23/2020   Menorrhagia with regular cycle 07/23/2020   Iron deficiency anemia due to chronic blood loss 07/23/2020   Menstrual cramps 07/23/2020   Class 2 obesity 06/18/2020   CAD (coronary artery disease) 06/18/2020   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 06/18/2020   Angina at rest 06/18/2020   Mixed hyperlipidemia    Class 2 obesity due to excess calories without serious comorbidity with body mass index (BMI) of 39.0 to 39.9 in adult    Acute renal failure superimposed on stage 4 chronic kidney disease 01/01/2020   Acute ischemic stroke 01/01/2020   Tobacco abuse 01/01/2020   Cerebrovascular accident (CVA) 12/31/2019   Left sided numbness 12/31/2019   Vitamin D deficiency 07/24/2015   Primary hypertension 03/17/2015   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 [place] [address] Phone: [phone] Fax: [phone]     Social Determinants of Health (SDOH) Social History: [place]: No Food Insecurity (08/03/2022)  Housing: Low Risk  (08/03/2022)  Transportation Needs: No Transportation Needs (08/03/2022)  Utilities: Not At Risk (08/03/2022)  Alcohol Screen: Low Risk  (03/03/2022)  Depression (PHQ2-9): Low Risk  (07/28/2022)   Financial Resource Strain: Low Risk  (03/03/2022)  Recent Concern: Financial Resource Strain - High Risk (01/14/2022)  Physical Activity: Inactive (03/03/2022)  Social Connections: Socially Integrated (03/03/2022)  Stress: No Stress Concern Present (03/03/2022)  Recent Concern: Stress - Stress Concern Present (12/22/2021)  Tobacco Use: Medium Risk (08/03/2022)   SDOH Interventions:     Readmission Risk Interventions    08/05/2022   11:08 AM  Readmission Risk Prevention Plan  Transportation Screening Complete  Medication Review ([place]) Complete  PCP or Specialist appointment within 3-5 days of discharge Complete  Palliative Care Screening Not [place] Not Applicable

## 2022-08-06 ENCOUNTER — Ambulatory Visit (HOSPITAL_COMMUNITY): Admission: RE | Admit: 2022-08-06 | Payer: BC Managed Care – PPO | Source: Home / Self Care | Admitting: Podiatry

## 2022-08-06 ENCOUNTER — Ambulatory Visit: Payer: Self-pay | Admitting: *Deleted

## 2022-08-06 ENCOUNTER — Telehealth: Payer: Self-pay

## 2022-08-06 SURGERY — DEBRIDEMENT, WOUND
Anesthesia: Choice | Laterality: Left

## 2022-08-06 NOTE — Patient Outreach (Signed)
  Care Coordination   08/06/2022  Name: Ceri Heidtke MRN: [id number] DOB: 07/04/1980   Care Coordination Outreach Attempts:  An unsuccessful telephone outreach was attempted today to offer the patient information about available care coordination services as a benefit of their health plan. HIPAA compliant message left on voicemail, providing contact information for CSW, encouraging patient to return CSW's call at her earliest convenience.  Follow Up Plan:  Additional outreach attempts will be made to offer the patient care coordination information and services.   Encounter Outcome:  No Answer.   Care Coordination Interventions:  No, not indicated.    Danford Bad, BSW, MSW, LCSW  Licensed Restaurant manager, [employer]  Mailing [place] [address], [place], [place] [address] Physical Address[address], [place], [place] [address] Toll Free Main # [phone] Fax # [phone] Cell # [phone] [email or web address]

## 2022-08-06 NOTE — Transitions of Care (Post Inpatient/ED Visit) (Signed)
   08/06/2022  Name: Lauren [name] MRN: [id number] DOB: December 31, 1980  Today's TOC FU Call Status: Today's TOC FU Call Status:: Successful TOC FU Call Competed TOC FU Call Complete Date: 08/06/22  Transition Care Management Follow-up Telephone Call Date of Discharge: 08/05/22 Discharge Facility[facility]) Type of Discharge: Inpatient Admission Primary Inpatient Discharge Diagnosis:: NSTEMI How have you been since you were released from the hospital?: Better ("I am good") Any questions or concerns?: No  Items Reviewed: Did you receive and understand the discharge instructions provided?: Yes Medications obtained and verified?: Yes (Medications Reviewed) Any new allergies since your discharge?: No Dietary orders reviewed?: No Do you have support at home?: Yes People in Home: spouse Name of Support/Comfort Primary Source: [facility] and Equipment/Supplies: Were Home Health Services Ordered?: No Any new equipment or medical supplies ordered?: No  Functional Questionnaire: Do you need assistance with bathing/showering or dressing?: No Do you need assistance with meal preparation?: No Do you need assistance with eating?: No Do you need assistance with getting out of bed/getting out of a chair/moving?: No Do you have difficulty managing or taking your medications?: No  Follow up appointments reviewed: PCP Follow-up appointment confirmed?: Yes Date of PCP follow-up appointment?: 08/11/22 Follow-up Provider: Dr. Durwin Nora Specialist [facility] Follow-up appointment confirmed?: NA Do you need transportation to your follow-up appointment?: No Do you understand care options if your condition(s) worsen?: Yes-patient verbalized understanding  SDOH Interventions Today    Flowsheet Row Most Recent Value  SDOH Interventions   Food Insecurity Interventions Intervention Not Indicated  Housing Interventions Intervention Not Indicated      Jodelle Gross, RN, BSN, CCM Care  Management Coordinator [facility] Phone: [phone]/Fax: [phone]

## 2022-08-10 ENCOUNTER — Ambulatory Visit (INDEPENDENT_AMBULATORY_CARE_PROVIDER_SITE_OTHER): Payer: BC Managed Care – PPO | Admitting: Podiatry

## 2022-08-10 ENCOUNTER — Ambulatory Visit (HOSPITAL_COMMUNITY): Payer: BC Managed Care – PPO

## 2022-08-10 DIAGNOSIS — M79673 Pain in unspecified foot: Secondary | ICD-10-CM

## 2022-08-10 DIAGNOSIS — T148XXD Other injury of unspecified body region, subsequent encounter: Secondary | ICD-10-CM

## 2022-08-11 ENCOUNTER — Ambulatory Visit (INDEPENDENT_AMBULATORY_CARE_PROVIDER_SITE_OTHER): Payer: BC Managed Care – PPO | Admitting: Internal Medicine

## 2022-08-11 ENCOUNTER — Ambulatory Visit (HOSPITAL_COMMUNITY)
Admission: RE | Admit: 2022-08-11 | Discharge: 2022-08-11 | Disposition: A | Discharge status: Home / Self Care | Payer: BC Managed Care – PPO | Source: Ambulatory Visit | Attending: Internal Medicine | Admitting: Internal Medicine

## 2022-08-11 ENCOUNTER — Ambulatory Visit: Payer: BC Managed Care – PPO | Attending: Cardiology | Admitting: *Deleted

## 2022-08-11 VITALS — BP 134/79 | HR 82 | Resp 16 | Ht 65.0 in | Wt 233.0 lb

## 2022-08-11 DIAGNOSIS — Z5181 Encounter for therapeutic drug level monitoring: Secondary | ICD-10-CM | POA: Diagnosis not present

## 2022-08-11 DIAGNOSIS — I5042 Chronic combined systolic (congestive) and diastolic (congestive) heart failure: Secondary | ICD-10-CM | POA: Diagnosis not present

## 2022-08-11 DIAGNOSIS — I63511 Cerebral infarction due to unspecified occlusion or stenosis of right middle cerebral artery: Secondary | ICD-10-CM | POA: Diagnosis not present

## 2022-08-11 DIAGNOSIS — R112 Nausea with vomiting, unspecified: Secondary | ICD-10-CM | POA: Diagnosis not present

## 2022-08-11 DIAGNOSIS — I639 Cerebral infarction, unspecified: Secondary | ICD-10-CM | POA: Diagnosis not present

## 2022-08-11 DIAGNOSIS — R911 Solitary pulmonary nodule: Secondary | ICD-10-CM

## 2022-08-11 DIAGNOSIS — K5900 Constipation, unspecified: Secondary | ICD-10-CM | POA: Diagnosis not present

## 2022-08-11 DIAGNOSIS — E1059 Type 1 diabetes mellitus with other circulatory complications: Secondary | ICD-10-CM | POA: Diagnosis not present

## 2022-08-11 DIAGNOSIS — R109 Unspecified abdominal pain: Secondary | ICD-10-CM | POA: Diagnosis not present

## 2022-08-11 LAB — POCT INR: INR: 2.1 (ref 2.0–3.0)

## 2022-08-11 MED ORDER — SENNOSIDES-DOCUSATE SODIUM 8.6-50 MG PO TABS
1.0000 | ORAL_TABLET | Freq: Every day | ORAL | 0 refills | Status: DC | PRN
Start: 2022-08-11 — End: 2023-05-16

## 2022-08-11 MED ORDER — SCOPOLAMINE 1 MG/3DAYS TD PT72: 1.0000 | MEDICATED_PATCH | TRANSDERMAL | 0 refills | Status: DC

## 2022-08-11 NOTE — Assessment & Plan Note (Addendum)
Oral antiemetics are not working.  Patient had scopolamine patch in the hospital and improved nausea/vomiting, .Prescribe scopolamine patch today.  Will send for abdominal x-ray to look for obstruction secondary to opioid induced constipation.  Patient will take senna docusate with opioid medication.  She is seeing pain management for prescription of this opioid.  If imaging is normal this could be secondary to previous strokes however I cannot say definitively.  I will ask patient to follow-up with her neurologist who she should be seeing soon to discuss previous MRI and if this could be causing her nausea/vomiting.  Treatment would be antiemetic. Further workup would be needed if not from stroke or bowel obstruction.

## 2022-08-11 NOTE — Patient Instructions (Signed)
Continue warfarin 1 tablet daily except 1 1/2 tablets on Saturdays.  Recheck in 2 wk On Augmenting and Doxycycline x 25 days

## 2022-08-11 NOTE — Progress Notes (Signed)
   HPI:[name] is a 42 y.o. female who presents for evaluation of Transitions Of Care.  Patient discharged 4/4 after having elevated troponins thought from microvascular angina.  Cardiology saw patient on admission and no suitable targets for revascularization.  Patient's concern today is nausea and vomiting.  Vomiting approximately 6 times a day . Worsened in last 3 months. Not associated with eating. Believes she had gastric emptying study but never told she has gastroparesis.This started after her stroke 01/2022. She has a history of chronic constipation and has been on oxycodone or tramadol for the past 6 months.  She had diarrhea noted on admission but gives history of constipation before that started and then has not had a bowel movement for 4 days. Scopoline patch has helped with nausea. Oral antiemetics have not helped. Has some mild chest pain after vomiting, but no severe crushing chest pain.  Physical Exam: Vitals:   08/11/22 1635  BP: 134/79  Pulse: 82  Resp: 16  SpO2: 97%  Weight: 233 lb (105.7 kg)  Height: 5\' 5"  (1.651 m)     Physical Exam Constitutional:      Comments: Examined in wheelchair  Cardiovascular:     Rate and Rhythm: Normal rate and regular rhythm.     Heart sounds: No murmur heard. Pulmonary:     Effort: Pulmonary effort is normal.     Breath sounds: No wheezing or rales.  Abdominal:     General: Bowel sounds are normal. There is no distension.     Palpations: Abdomen is soft.     Tenderness: There is abdominal tenderness (llq with deep palpation).      Assessment & Plan:   Lauren [name] was seen today for transitions of care.  Lung nodule seen on imaging study Assessment & Plan: Review of inpatient imaging shows enlarging 9 x 11 mm macrolobulated nodule in the posterior aspect of the right lower lobe.  Further imaging was recommended with nonemergent chest CT. Chest CT ordered    Orders: -     CT CHEST WO CONTRAST  Nausea and vomiting,  unspecified vomiting type Assessment & Plan: Oral antiemetics are not working.  Patient had scopolamine patch in the hospital and improved nausea/vomiting, .Prescribe scopolamine patch today.  Will send for abdominal x-ray to look for obstruction secondary to opioid induced constipation.  Patient will take senna docusate with opioid medication.  She is seeing pain management for prescription of this opioid.  If imaging is normal this could be secondary to previous strokes however I cannot say definitively.  I will ask patient to follow-up with her neurologist who she should be seeing soon to discuss previous MRI and if this could be causing her nausea/vomiting.  Treatment would be antiemetic. Further workup would be needed if not from stroke or bowel obstruction.   Orders: -     DG Abd 1 View -     Sennosides-Docusate Sodium; Take 1-2 tablets by mouth daily as needed for mild constipation.  Dispense: 30 tablet; Refill: 0 -     BMP8+EGFR -     CBC with Differential/Platelet  Other orders -     Scopolamine; Place 1 patch (1.5 mg total) onto the skin every 3 (three) days.  Dispense: 10 patch; Refill: 0      Milus Banister, MD

## 2022-08-11 NOTE — Patient Instructions (Signed)
Thank you, [name] for allowing [name] to provide your care today.   I have ordered the following labs for you:   Lab Orders         CBC with Differential/Platelet         BMP8+EGFR      Tests ordered today:  Abdominal Xray CT Chest  Referrals ordered today:   Referral Orders  No referral(s) requested today      Reminders: Have labs drawn. Then go to [facility] for abdominal xray. I will follow up with results. Medications sent to your pharmacy. Follow up in one month.      Lauren [name], M.D.

## 2022-08-11 NOTE — Assessment & Plan Note (Signed)
Review of inpatient imaging shows enlarging 9 x 11 mm macrolobulated nodule in the posterior aspect of the right lower lobe.  Further imaging was recommended with nonemergent chest CT. Chest CT ordered

## 2022-08-12 ENCOUNTER — Encounter: Payer: BC Managed Care – PPO | Admitting: Podiatry

## 2022-08-12 ENCOUNTER — Ambulatory Visit (HOSPITAL_COMMUNITY)
Admission: RE | Admit: 2022-08-12 | Discharge: 2022-08-12 | Disposition: A | Discharge status: Home / Self Care | Payer: BC Managed Care – PPO | Source: Ambulatory Visit | Attending: Internal Medicine | Admitting: Internal Medicine

## 2022-08-12 ENCOUNTER — Ambulatory Visit (HOSPITAL_COMMUNITY): Payer: BC Managed Care – PPO

## 2022-08-12 DIAGNOSIS — R911 Solitary pulmonary nodule: Secondary | ICD-10-CM | POA: Insufficient documentation

## 2022-08-12 DIAGNOSIS — R918 Other nonspecific abnormal finding of lung field: Secondary | ICD-10-CM | POA: Diagnosis not present

## 2022-08-12 LAB — CBC WITH DIFFERENTIAL/PLATELET
Basophils Absolute: 0.1 10*3/uL (ref 0.0–0.2)
Basos: 1 %
EOS (ABSOLUTE): 0.1 10*3/uL (ref 0.0–0.4)
Eos: 1 %
Hematocrit: 33.4 % — ABNORMAL LOW (ref 34.0–46.6)
Hemoglobin: 10.6 g/dL — ABNORMAL LOW (ref 11.1–15.9)
Immature Grans (Abs): 0 10*3/uL (ref 0.0–0.1)
Immature Granulocytes: 0 %
Lymphocytes Absolute: 1.9 10*3/uL (ref 0.7–3.1)
Lymphs: 19 %
MCH: 27.5 pg (ref 26.6–33.0)
MCHC: 31.7 g/dL (ref 31.5–35.7)
MCV: 87 fL (ref 79–97)
Monocytes Absolute: 0.4 10*3/uL (ref 0.1–0.9)
Monocytes: 4 %
Neutrophils Absolute: 7.5 10*3/uL — ABNORMAL HIGH (ref 1.4–7.0)
Neutrophils: 75 %
Platelets: 567 10*3/uL — ABNORMAL HIGH (ref 150–450)
RBC: 3.85 x10E6/uL (ref 3.77–5.28)
RDW: 14 % (ref 11.7–15.4)
WBC: 10 10*3/uL (ref 3.4–10.8)

## 2022-08-12 LAB — BMP8+EGFR
BUN/Creatinine Ratio: 15 (ref 9–23)
BUN: 37 mg/dL — ABNORMAL HIGH (ref 6–24)
CO2: 25 mmol/L (ref 20–29)
Calcium: 9.1 mg/dL (ref 8.7–10.2)
Chloride: 98 mmol/L (ref 96–106)
Creatinine, Ser: 2.42 mg/dL — ABNORMAL HIGH (ref 0.57–1.00)
Glucose: 97 mg/dL (ref 70–99)
Potassium: 5.1 mmol/L (ref 3.5–5.2)
Sodium: 140 mmol/L (ref 134–144)
eGFR: 25 mL/min/{1.73_m2} — ABNORMAL LOW (ref >59)

## 2022-08-15 NOTE — Progress Notes (Signed)
  Subjective:  Patient ID: Lauren [name], female    DOB: Oct 07, 1980,  MRN: [id number]  Chief Complaint  Patient presents with   Diabetic Ulcer    POST OP FROM HOSPITAL SX   07/21/2022, left foot fifth metatarsal resection  42 y.o. female returns for post-op check.  She says the foot is doing okay, unfortunately suffered another NSTEMI last week  Review of Systems: Negative except as noted in the HPI. Denies N/V/F/Ch.   Objective:  There were no vitals filed for this visit. There is no height or weight on file to calculate BMI. Constitutional Well developed. Well nourished.  Vascular Foot warm and well perfused. Capillary refill normal to all digits.  Calf is soft and supple, no posterior calf or knee pain, negative Homans' sign  Neurologic Normal speech. Oriented to person, place, and time. Epicritic sensation to light touch grossly present bilaterally.  Dermatologic Central area of delayed healing.  Remainder of the incision is intact without signs of infection  Orthopedic: Only mild edema, she has no tenderness to palpation noted about the surgical site.    Assessment:   1. Delayed wound healing    Plan:  Patient was evaluated and treated and all questions answered.  S/p foot surgery left -Does show some delayed wound healing.  Continue dressing changes at home.  Would like to leave sutures in for another 3 weeks and then we will look at removing and proceeding with wound care for the residual areas.  Return in about 3 weeks (around 08/31/2022) for wound care, suture removal.

## 2022-08-18 ENCOUNTER — Ambulatory Visit: Payer: Self-pay | Admitting: *Deleted

## 2022-08-18 ENCOUNTER — Encounter: Payer: Self-pay | Admitting: *Deleted

## 2022-08-18 NOTE — Patient Outreach (Signed)
Care Coordination   Follow Up Visit Note   08/18/2022  Name: Lauren [name] MRN: [id number] DOB: 27-Oct-1980  Lauren [name] is a 42 y.o. year old female who sees Durwin Nora, Lucina Mellow, MD for primary care. I spoke with Toy Baker by phone today.  What matters to the patients health and wellness today?   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   Goals Addressed               This Visit's Progress     COMPLETED: Assist with Applying for Medicaid, Quarry manager. (pt-stated)   On track     Care Coordination Interventions:  Interventions Today    Flowsheet Row Most Recent Value  Chronic Disease   Chronic disease during today's visit Other  [History of CVA, Tobacco Abuse & Class 2 Morbid Obesity]  General Interventions   General Interventions Discussed/Reviewed General Interventions Discussed, Labs, Annual Foot Exam, General Interventions Reviewed, Annual Eye Exam, Durable Medical Equipment (DME), Community Resources, Level of Care, Communication with, Doctor Visits, Vaccines, Health Screening  [Communication with Primary Care Provider]  Labs Hgb A1c every 3 months, Kidney Function  [Encouraged]  Vaccines COVID-19, Flu, Pneumonia, RSV, Shingles, Tetanus/Pertussis/Diphtheria  [Encouraged]  Doctor Visits Discussed/Reviewed Doctor Visits Discussed, Doctor Visits Reviewed, Annual Wellness Visits, PCP, Specialist  [Encouraged]  Health Screening Bone Density, Colonoscopy, Mammogram  [Encouraged]  Durable Medical Equipment (DME) BP Cuff, Glucomoter, Oxygen, Walker, Psychologist, forensic  PCP/Specialist Visits Compliance with follow-up visit  [Encouraged]  Communication with PCP/Specialists, RN  [Encouraged]  Level of Care Adult Daycare, Applications, Assisted Living, Skilled Nursing Facility, Teaching laboratory technician Medicaid, Personal Care Services, [place]   Exercise Interventions   Exercise Discussed/Reviewed Exercise Discussed, Assistive device use and maintanence, Exercise Reviewed, Physical Activity  [Encouraged]  Physical Activity Discussed/Reviewed Physical Activity Discussed, Physical Activity Reviewed, Home Exercise Program (HEP), Types of exercise  [Encouraged]  Education Interventions   Education Provided Provided Therapist, sports, Provided Web-based Education, Provided Education  Provided Verbal Education On Nutrition, Foot Care, Eye Care, Blood Sugar Monitoring, Medication, Development worker, community, [employer], Exercise, Applications, Mental Health/Coping with Illness, When to see the doctor  Applications Medicaid, Personal Care Services, [place]  Mental Health Interventions   Mental Health Discussed/Reviewed Mental Health Discussed, Mental Health Reviewed, Coping Strategies, Crisis, Suicide, Substance Abuse, Grief and Loss, Depression, Anxiety  Nutrition Interventions   Nutrition Discussed/Reviewed Nutrition Discussed, Adding fruits and vegetables, Increaing proteins, Decreasing fats, Decreasing salt, Fluid intake, Nutrition Reviewed, Carbohydrate meal planning, Portion sizes, Decreasing sugar intake  [Encouraged]  Pharmacy Interventions   Pharmacy Dicussed/Reviewed Pharmacy Topics Discussed, Pharmacy Topics Reviewed, Medication Adherence, Affording Medications  [Encouraged]  Safety Interventions   Safety Discussed/Reviewed Safety Discussed, Safety Reviewed, Fall Risk, Home Safety  [Encouraged]  Home Safety Assistive Devices, Need for home safety assessment, Refer for home visit, Refer for community resources, Contact provider for referral to PT/OT  Advanced Directive Interventions   Advanced Directives Discussed/Reviewed Advanced Directives Discussed, Advanced Directives Reviewed, Advanced Care Planning  [Do Not Resusitate]       Active Listening & Reflection Utilized.  Verbalization of Feelings Encouraged.  Emotional Support  Provided. Feelings of Sadness Regarding Loss of Independence Validated. Caregiver Frustration & Stress Acknowledged. Caregiver Resources Reviewed. Caregiver Support Groups Emailed. Self-Enrollment in Caregiver Support Group of Interest Emphasized. Problem Solving Interventions Activated. Task-Centered Solutions Implemented.   Solution-Focused Strategies Indicated. Acceptance & Commitment Therapy Performed. Cognitive Behavioral Therapy Initiated. Client-Centered  Therapy Employed. Confirmed Receipt & Thoroughly Reviewed the Following List of [employer], Services & Resources: ~ Adult Day Care Programs  ~ In-Home Care & Respite Agencies ~ Home Health Care Agencies ~ Respite Care Agencies & Facilities ~ Theatre manager Providers Encouraged Careers information officer with [employer], Services & Resources of Interest, in An Effort to Obtain 24 Hour Care & Supervision in The Home. Encouraged Completion of Application for Personal Care Services, through [facility] [phone]), If Approved for Medicaid, through [facility] of Social Services [phone]). Encouraged Careers information officer with CSW (# [phone]), if You Have Questions, Need Assistance, Change Your Mind About Wanting to [employer], or If Additional Social Work Needs Are Identified in The Near Future.        SDOH assessments and interventions completed:  Yes.  Care Coordination Interventions:  Yes, provided.   Follow up plan: No further intervention required.   Encounter Outcome:  Pt. Visit Completed.   Danford Bad, BSW, MSW, LCSW  Licensed Restaurant manager, fast food [facility]  Mailing [place] [address] [place], [place] [address] Physical Address[address] [place], [place] [address] Toll Free Main # [phone] Fax # [phone] Cell # [phone] [email or web address]

## 2022-08-18 NOTE — Patient Instructions (Signed)
Visit Information  Thank you for taking time to visit with me today. Please don't hesitate to contact me if I can be of assistance to you.   Following are the goals we discussed today:   Goals Addressed               This Visit's Progress     COMPLETED: Assist with Applying for Medicaid, Quarry manager. 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)   On track     Care Coordination Interventions:  Interventions Today    Flowsheet Row Most Recent Value  Chronic Disease   Chronic disease during today's visit Other  [History of CVA, Tobacco Abuse & Class 2 Morbid Obesity]  General Interventions   General Interventions Discussed/Reviewed General Interventions Discussed, Labs, Annual Foot Exam, General Interventions Reviewed, Annual Eye Exam, Durable Medical Equipment (DME), Community Resources, Level of Care, Communication with, Doctor Visits, Vaccines, Health Screening  [Communication with Primary Care Provider]  Labs Hgb A1c every 3 months, Kidney Function  [Encouraged]  Vaccines COVID-19, Flu, Pneumonia, RSV, Shingles, Tetanus/Pertussis/Diphtheria  [Encouraged]  Doctor Visits Discussed/Reviewed Doctor Visits Discussed, Doctor Visits Reviewed, Annual Wellness Visits, PCP, Specialist  [Encouraged]  Health Screening Bone Density, Colonoscopy, Mammogram  [Encouraged]  Durable Medical Equipment (DME) BP Cuff, Glucomoter, Oxygen, Walker, Psychologist, forensic  PCP/Specialist Visits Compliance with follow-up visit  [Encouraged]  Communication with PCP/Specialists, RN  [Encouraged]  Level of Care Adult Daycare, Applications, Assisted Living, Skilled Nursing Facility, Teaching laboratory technician Medicaid, Personal Care Services, [place]  Exercise Interventions   Exercise Discussed/Reviewed Exercise Discussed, Assistive device use and maintanence, Exercise Reviewed, Physical Activity  [Encouraged]  Physical Activity Discussed/Reviewed Physical Activity  Discussed, Physical Activity Reviewed, Home Exercise Program (HEP), Types of exercise  [Encouraged]  Education Interventions   Education Provided Provided Therapist, sports, Provided Web-based Education, Provided Education  Provided Verbal Education On Nutrition, Foot Care, Eye Care, Blood Sugar Monitoring, Medication, Development worker, community, [employer], Exercise, Applications, Mental Health/Coping with Illness, When to see the doctor  Applications Medicaid, Personal Care Services, [place]  Mental Health Interventions   Mental Health Discussed/Reviewed Mental Health Discussed, Mental Health Reviewed, Coping Strategies, Crisis, Suicide, Substance Abuse, Grief and Loss, Depression, Anxiety  Nutrition Interventions   Nutrition Discussed/Reviewed Nutrition Discussed, Adding fruits and vegetables, Increaing proteins, Decreasing fats, Decreasing salt, Fluid intake, Nutrition Reviewed, Carbohydrate meal planning, Portion sizes, Decreasing sugar intake  [Encouraged]  Pharmacy Interventions   Pharmacy Dicussed/Reviewed Pharmacy Topics Discussed, Pharmacy Topics Reviewed, Medication Adherence, Affording Medications  [Encouraged]  Safety Interventions   Safety Discussed/Reviewed Safety Discussed, Safety Reviewed, Fall Risk, Home Safety  [Encouraged]  Home Safety Assistive Devices, Need for home safety assessment, Refer for home visit, Refer for community resources, Contact provider for referral to PT/OT  Advanced Directive Interventions   Advanced Directives Discussed/Reviewed Advanced Directives Discussed, Advanced Directives Reviewed, Advanced Care Planning  [Do Not Resusitate]       Active Listening & Reflection Utilized.  Verbalization of Feelings Encouraged.  Emotional Support Provided. Feelings of Sadness Regarding Loss of Independence Validated. Caregiver Frustration & Stress Acknowledged. Caregiver Resources Reviewed. Caregiver Support Groups Emailed. Self-Enrollment in Caregiver Support Group  of Interest Emphasized. Problem Solving Interventions Activated. Task-Centered Solutions Implemented.   Solution-Focused Strategies Indicated. Acceptance & Commitment Therapy Performed. Cognitive Behavioral Therapy Initiated. Client-Centered Therapy Employed. Confirmed Receipt & Thoroughly Reviewed the Following List of [employer], Services & Resources: ~ Adult Day Care Programs  ~ In-Home Care & Respite Agencies ~ [employer] ~ Respite  Care Agencies & Facilities ~ Personal Care Services Agency Providers Encouraged Careers information officer with [employer], Services & Resources of Interest, in An Effort to Obtain 24 Hour Care & Supervision in The Home. Encouraged Completion of Application for Personal Care Services, through [facility] [phone]), If Approved for Medicaid, through [facility] of Social Services [phone]). Encouraged Careers information officer with CSW (# [phone]), if You Have Questions, Need Assistance, Change Your Mind About Wanting to [employer], or If Additional Social Work Needs Are Identified in The Near Future.        Please call the care guide team at [phone] if you need to cancel or reschedule your appointment.   If you are experiencing a Mental Health or Behavioral Health Crisis or need someone to talk to, please call the Suicide and Crisis Lifeline: 988 call the [place] National Suicide Prevention Lifeline: (954) 278-8589 or TTY: 7038559077 TTY 709-607-3680) to talk to a trained counselor call 1-800-273-TALK (toll free, 24 hour hotline) go to [facility] [address], [place] [phone]) call the [facility] Crisis Line: ([phone] call 911  Patient verbalizes understanding of instructions and care plan provided today and agrees to view in MyChart. Active MyChart status and patient understanding of how to access instructions and care plan via  MyChart confirmed with patient.     No further follow up required.  Danford Bad, BSW, MSW, LCSW  Licensed Restaurant manager, fast food [facility]  Mailing [place] [address] [place], [place] [address] Physical Address-[address], [place], [place] [address] Toll Free Main # [phone] Fax # [phone] Cell # [phone] [email or web address]

## 2022-08-19 ENCOUNTER — Other Ambulatory Visit: Payer: Self-pay | Admitting: Nurse Practitioner

## 2022-08-19 ENCOUNTER — Other Ambulatory Visit (HOSPITAL_COMMUNITY): Payer: Self-pay

## 2022-08-19 ENCOUNTER — Other Ambulatory Visit: Payer: Self-pay

## 2022-08-19 MED ORDER — OMNIPOD 5 DEXG7G6 PODS GEN 5 MISC
3 refills | Status: DC
  Filled 2022-08-19: qty 10, 30d supply, fill #0
  Filled 2022-09-20: qty 10, 30d supply, fill #1

## 2022-08-20 ENCOUNTER — Encounter: Payer: Self-pay | Admitting: Physical Medicine & Rehabilitation

## 2022-08-20 ENCOUNTER — Encounter: Payer: BC Managed Care – PPO | Attending: Registered Nurse | Admitting: Physical Medicine & Rehabilitation

## 2022-08-20 ENCOUNTER — Telehealth: Payer: Self-pay

## 2022-08-20 ENCOUNTER — Other Ambulatory Visit (HOSPITAL_COMMUNITY): Payer: Self-pay

## 2022-08-20 VITALS — BP 130/84 | HR 74 | Ht 65.0 in

## 2022-08-20 DIAGNOSIS — I63511 Cerebral infarction due to unspecified occlusion or stenosis of right middle cerebral artery: Secondary | ICD-10-CM | POA: Diagnosis not present

## 2022-08-20 DIAGNOSIS — R252 Cramp and spasm: Secondary | ICD-10-CM

## 2022-08-20 DIAGNOSIS — Z79891 Long term (current) use of opiate analgesic: Secondary | ICD-10-CM | POA: Insufficient documentation

## 2022-08-20 DIAGNOSIS — I639 Cerebral infarction, unspecified: Secondary | ICD-10-CM | POA: Diagnosis not present

## 2022-08-20 DIAGNOSIS — R600 Localized edema: Secondary | ICD-10-CM | POA: Diagnosis not present

## 2022-08-20 DIAGNOSIS — M7989 Other specified soft tissue disorders: Secondary | ICD-10-CM

## 2022-08-20 DIAGNOSIS — S43002S Unspecified subluxation of left shoulder joint, sequela: Secondary | ICD-10-CM | POA: Insufficient documentation

## 2022-08-20 DIAGNOSIS — G894 Chronic pain syndrome: Secondary | ICD-10-CM | POA: Insufficient documentation

## 2022-08-20 MED ORDER — OXYCODONE HCL 5 MG PO TABS: 5.0000 mg | ORAL_TABLET | Freq: Every day | ORAL | 0 refills | Status: DC | PRN

## 2022-08-20 MED ORDER — DULOXETINE HCL 30 MG PO CPEP: 30.0000 mg | ORAL_CAPSULE | Freq: Every day | ORAL | 3 refills | Status: DC

## 2022-08-20 NOTE — Progress Notes (Signed)
Subjective:    Patient ID: Lauren [name], female    DOB: Feb 17, 1981, 42 y.o.   MRN: [id number]  [facility] Summary 10/19   Brief HPI:   Lauren [name] is a 42 y.o. female noted to the emergency room on 05/10/2021 with unsteady gait, left-sided facial droop and left arm weakness with slurred speech.  Code stroke called.  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  She underwent thrombectomy with TICI 3.  Cardiology consulted and recent echo revealed decrease in EF to approximately 30 to 35%.  She was noted to have elevated troponin with unremarkable EKG and was treated with nitroglycerin and heparin infusion.  ID was consulted for input due to history of left foot osteomyelitis as well as podiatry.  She underwent bone biopsy of fourth metatarsal left foot and broad-spectrum antibiotics were adjusted.  She did receive 1 unit PRBCs due to drop in H&H.  Hypercoagulable work-up was negative and she was started on Lovenox per hematology input.  Antibiotics were discontinued as cultures were negative.  Diet was advanced to dysphagia 3, leukocytosis was resolving with improvement in renal status.  PT/OT /ST was working with patient who continued to be limited by weakness as well as cognitive deficits.  CIR was recommended due to functional decline.     Hospital Course: Lauren [name] was admitted to rehab 01/28/2022 for inpatient therapies to consist of PT, ST and OT at least three hours five days a week. Past admission physiatrist, therapy team and rehab RN have worked together to provide customized collaborative inpatient rehab. Diet advanced to regular textures. Seen by podiatry 9/30 and left films obtained. Some dehiscence of incision. New wound care orders placed.  [name] saw the patient on 10/3 and allowed the patient to wear post-op shoe or CAM boot to WBAT.    Rapid response called at 1147 on 10/4 due to chest pain. NTG given and  EKG times 2 performed. No evidence of STE. Chest xray c/w volume overload and mild pulmonary edema. Cardiology resident consulted. Lasix 40 mg PO given. Creatinine bump to 2.26. Symptoms improved. BP overnight 110s-120s.  Lasix 10 mg PO daily started on 10/5. Also, hemoglobin drift to 7.0 although on recheck was 7.8. Transfused one unit PRBCs. Check FOBT and restart home Protonix. Creatinine slightly lower at 2.12. Fecal occult stool testing negative times 3 samples and follow up CBC showed H/H to be stable on DAPT as well as Lovenox for coagulopathy.  Dose has been  adjusted due to CKD.  She has had issues with left shoulder pain and sling was ordered for use as needed.  Tramadol has also been used on as needed basis for pain management.   Sudden onset of chest pain again on 10/9. NTG times 3 doses given without relief. Rapid response called. Stat EKG, troponin and CK ordered. Chest x-ray ok and trop below her baseline. Trazodone increased to 75 mg at bedtime for sleep. Increased to 100 mg on 10/12. Wanted to try something else for sleep so started on tizanidine 4 mg q HS.  Hemoglobin trending upward and 9.3 on 10/16. Lasix decreased to 20 mg daily on 10/16 due to elevation in serum creatinine. Consulted medicine service on 10/18 for assistance with kidney function, fluid balance and glucose control.  Strict I's and O's were recommended as patient did not appear to to be fluid overloaded.     Blood pressures were monitored on TID basis  and she was hypotensive on 10/2 without complaints. Midday hydralazine held. Cardiology consulted due to low EF and multiple meds. At admission, she is receiving hydralazine 12.5 mg TID, isosorbide 20 mg BID, Lopressor 37.5 mg BID but was complicated by multiple episodes of orthostatic hypotension with presyncope and cards was consulted multiple times to help with adjustment of medications. Compression with ACE wrap LE and TED hose RLE when OOB also used for BP support. [name]  felt that there was nothing further to add for patient's symptoms and recommended home with hospice. As BP stabilized, Lasix 40 mg was started for vascular congestion seen on CXR 10/5. Lopressor increase back to 37.5 mg BID.   Diabetes has been monitored with ac/hs CBG checks and SSI was use prn for tighter BS control. Levemir 25 units continued and Novolog 2 units TID with meals. Diabetic coordinator consulted for assistance with diet. Increased meal coverage to 5 units and added nighttime SSI.  Multiple changes in insulin management were made throughout her stay.  Her blood sugars continue to be poorly controlled as patient was not willing to comply with dietary restrictions during her stay.  Internal medicine recommended that patient's insulin pump be resumed as patient reported that this worked better for her.   Diabetic coordinator was consulted who reviewed endocrinology notes and pump was resumed per home regimen.  Both patient and family have been instructed on importance of diabetic management as well as dietary restrictions to provide fluid overload as well as better blood sugar control.  They are by her of need to contact endocrinology for further adjustment in her insulin protocol.  Patient has made progress during his stay but continues to be limited by left inattention, decrease in attention, problems with recall as well as overall safety awareness.  She will continue to receive further follow-up home health PT, OT, OT and RN by [facility] home health after discharge.   .   Rehab course: During patient's stay in rehab weekly team conferences were held to monitor patient's progress, set goals and discuss barriers to discharge. At admission, patient required X assist with basic self-care skills, moderate to total assist for mobility.   She has had improvement in activity tolerance, balance, postural control as well as ability to compensate for deficits. She has had improvement in functional use LUE   and LLE as well as improvement in awareness.  She is able to complete ADL tasks with min assist.  She requires supervision for transfers and min assist to ambulate 30'with RW and left hand splint.  She requires min to mod assist with verbal and visual cues to complete functional and familiar tasks safely.  She is able to utilize compensatory swallow strategies at modified independent level.  Patient's safety is also intermittently impacted by mobility.  Family has been instructed on 24 hours supervision for safety. Family education has been completed regarding all aspects of care.        Interval history 04/19/2022 [name] reports that she has been doing well overall since her discharge from CIR.  She has completed her home therapy.  She is walking much better  She sometimes needs a little assistance with dressing however is able to do it mostly on her own.  She reports she was seen by her PCP for follow-up.  She is also following with hematology.  She feels like her strength has improved significantly since her discharge.  She continues to have shoulder pain and uses tramadol ordered by her  PCP about once a day.  She reports her left leg is healing well and she is following with her surgeon for this.  She denies any issues with bowel or bladder control.   Interval history 05/31/22 [name] is here for follow-up after her CVA.  She reports she continues to have pain in her left foot due to a foot ulcer.  She is following with wound care and Dr. Lilian Kapur podiatry for this issue.  She is also having severe pain in her left shoulder that is worsened with movement.  She reports that Dr. Estevan Oaks from neurology was considering her to be referred to PT for evaluation for possible vagal nerve stimulation treatment.  She continues to have spasticity in her left hand and wrist.  She is using oxycodone 5 mg as needed only about once a day for her shoulder and foot pain.     Interval history 05/31/22 Patient  presented for follow-up and shoulder cortisone injection.  Injection was rescheduled to later visit as patient reports she has a diabetic ulcer with significant MRSA infection and she is waiting on antibiotics to be started.  She continues to have severe pain in her left shoulder and left foot pain.  She was recently prescribed oxycodone 5 mg.  This does help her pain however causes too much sedation during the day so she only takes it at night.  We also discussed consideration of vivistim device.  She reports poor sleep and feels like trazodone was helpful in the hospital for this issue.   Interval history 08/20/22 [name] is here for follow-up after her MCA CVA.  On 07/21/2022 she had a left foot fifth metatarsal resection.  She continues to follow with podiatry and reports being on antibiotics currently.  Ms. Lundy has continued pain in her left shoulder and arm.  She also has worsening swelling in her left upper extremity.  Additionally she continues to have a lot of pain in her left foot since the surgery.  She has been taking oxycodone mostly at night at a dose of 5 mg.  She says this is that when the pain is the worst.  This takes the edge off the pain however does not completely relieve it.  We discussed vagal nerve stimulation and she would be interested in an evaluation for this.  Pain Inventory Average Pain 7 Pain Right Now 6 My pain is aching  In the last 24 hours, has pain interfered with the following? General activity 2 Relation with others 0 Enjoyment of life 10 What TIME of day is your pain at its worst? night Sleep (in general) Poor  Pain is worse with: some activites Pain improves with: medication Relief from Meds: 5  Family History  Problem Relation Age of Onset   Thyroid disease Mother    Hypertension Mother    Hyperlipidemia Mother    CAD Mother    CVA Mother    Hyperlipidemia Father    Hypertension Father    CAD Father    Breast cancer Paternal Grandmother 37    Cancer Paternal Grandmother        lung   Social History   Socioeconomic History   Marital status: Married    Spouse name: Harvel Quale   Number of children: 2   Years of education: 12   Highest education level: 12th grade  Occupational History    Comment: Full time   Occupation: CNA at [employer]    Comment: applying for disability  Tobacco  Use   Smoking status: Former    Packs/day: 1.00    Years: 15.00    Additional pack years: 0.00    Total pack years: 15.00    Types: Cigarettes    Quit date: 01/2022    Years since quitting: 0.6    Passive exposure: Past   Smokeless tobacco: Never  Vaping Use   Vaping Use: Never used  Substance and Sexual Activity   Alcohol use: Yes    Comment: occ   Drug use: No   Sexual activity: Yes    Birth control/protection: Surgical    Comment: tubal   Other Topics Concern   Not on file  Social History Narrative   Lives with husband and kids   Right handed   Drinks 9+ cups caffeine daily   Social Determinants of Health   Financial Resource Strain: Low Risk  (03/03/2022)   Overall Financial Resource Strain (CARDIA)    Difficulty of Paying Living Expenses: Not very hard  Recent Concern: Financial Resource Strain - High Risk (01/14/2022)   Overall Financial Resource Strain (CARDIA)    Difficulty of Paying Living Expenses: Hard  Food Insecurity: No Food Insecurity (08/06/2022)   Hunger Vital Sign    Worried About Running Out of Food in the Last Year: Never true    Ran Out of Food in the Last Year: Never true  Transportation Needs: No Transportation Needs (08/03/2022)   PRAPARE - Administrator, Civil Service (Medical): No    Lack of Transportation (Non-Medical): No  Physical Activity: Inactive (03/03/2022)   Exercise Vital Sign    Days of Exercise per Week: 0 days    Minutes of Exercise per Session: 0 min  Stress: No Stress Concern Present (03/03/2022)   [employer] of Occupational Health - Occupational Stress Questionnaire     Feeling of Stress : Only a little  Recent Concern: Stress - Stress Concern Present (12/22/2021)   [employer] of Occupational Health - Occupational Stress Questionnaire    Feeling of Stress : To some extent  Social Connections: Socially Integrated (03/03/2022)   Social Connection and Isolation Panel [NHANES]    Frequency of Communication with Friends and Family: More than three times a week    Frequency of Social Gatherings with Friends and Family: More than three times a week    Attends Religious Services: More than 4 times per year    Active Member of Clubs or Organizations: Yes    Attends Banker Meetings: More than 4 times per year    Marital Status: Married   Past Surgical History:  Procedure Laterality Date   ABDOMINAL AORTOGRAM W/LOWER EXTREMITY N/A 06/10/2022   Procedure: ABDOMINAL AORTOGRAM W/LOWER EXTREMITY;  Surgeon: Cephus Shelling, MD;  Location: [facility];  Service: Cardiovascular;  Laterality: N/A;   AMPUTATION Left 09/02/2021   Procedure: AMPUTATION RAY;  Surgeon: Edwin Cap, DPM;  Location: [facility] OR;  Service: Podiatry;  Laterality: Left;  sagittal saw, 3L bag saline & Pulse   Cardiac catherization     CHOLECYSTECTOMY     I & D EXTREMITY Left 08/30/2021   Procedure: IRRIGATION AND DEBRIDEMENT WITH BONE BIOPSY;  Surgeon: Candelaria Stagers, DPM;  Location: [facility] OR;  Service: Podiatry;  Laterality: Left;   IR ANGIO VERTEBRAL SEL SUBCLAVIAN INNOMINATE UNI L MOD SED  01/18/2022   IR CT HEAD LTD  01/08/2022   IR INTRA CRAN STENT  01/08/2022   IR PERCUTANEOUS ART THROMBECTOMY/INFUSION INTRACRANIAL INC DIAG  ANGIO  01/08/2022   IRRIGATION AND DEBRIDEMENT FOOT Left 09/04/2021   Procedure: IRRIGATION AND DEBRIDEMENT FOOT AND CLOSURE;  Surgeon: Edwin Cap, DPM;  Location: [facility];  Service: Podiatry;  Laterality: Left;   LEFT HEART CATH AND CORONARY ANGIOGRAPHY N/A 07/26/2022   Procedure: LEFT HEART CATH AND CORONARY ANGIOGRAPHY;  Surgeon: Swaziland, Peter M, MD;   Location: [facility] INVASIVE CV LAB;  Service: Cardiovascular;  Laterality: N/A;   METATARSAL OSTEOTOMY Left 07/21/2022   Procedure: LEFT FOOT EXCISION OF FIFTH METATARSAL WITH DEBRIDEMENT OF ULCER, POSSIBLE BONE BIOPSY OF THE FOURTH METATARSAL, POSSIBLE FOURTH RAY AMPUTATION OF LEFT TOEAND METATARSAL;  Surgeon: Vivi Barrack, DPM;  Location: [facility];  Service: Podiatry;  Laterality: Left;   PERIPHERAL VASCULAR BALLOON ANGIOPLASTY Left 06/10/2022   Procedure: PERIPHERAL VASCULAR BALLOON ANGIOPLASTY;  Surgeon: Cephus Shelling, MD;  [facility]: [facility];  Service: Cardiovascular;  Laterality: Left;  AT and DP   PERIPHERAL VASCULAR INTERVENTION Left 06/10/2022   Procedure: PERIPHERAL VASCULAR INTERVENTION;  Surgeon: Cephus Shelling, MD;  Location: [facility] INVASIVE CV LAB;  Service: Cardiovascular;  Laterality: Left;  SFA   RADIOLOGY WITH ANESTHESIA N/A 01/08/2022   Procedure: IR WITH ANESTHESIA;  Surgeon: Radiologist, Medication, MD;  Location: [facility];  Service: Radiology;  Laterality: N/A;   THROMBECTOMY BRACHIAL ARTERY Right 11/18/2021   Procedure: RIGHT BRACHIAL, RADIAL, & ULNAR ARTERY THROMBECTOMY.;  Surgeon: Cephus Shelling, MD;  Location: [facility];  Service: Vascular;  Laterality: Right;   TUBAL LIGATION     Past Surgical History:  Procedure Laterality Date   ABDOMINAL AORTOGRAM W/LOWER EXTREMITY N/A 06/10/2022   Procedure: ABDOMINAL AORTOGRAM W/LOWER EXTREMITY;  Surgeon: Cephus Shelling, MD;  [facility];  Service: Cardiovascular;  Laterality: N/A;   AMPUTATION Left 09/02/2021   Procedure: AMPUTATION RAY;  Surgeon: Edwin Cap, DPM;  [facility]: [facility];  Service: Podiatry;  Laterality: Left;  sagittal saw, 3L bag saline & Pulse   Cardiac catherization     CHOLECYSTECTOMY     I & D EXTREMITY Left 08/30/2021   Procedure: IRRIGATION AND DEBRIDEMENT WITH BONE BIOPSY;  Surgeon: Candelaria Stagers, DPM;  [facility]: [facility];  Service: Podiatry;  Laterality: Left;   IR ANGIO  VERTEBRAL SEL SUBCLAVIAN INNOMINATE UNI L MOD SED  01/18/2022   IR CT HEAD LTD  01/08/2022   IR INTRA CRAN STENT  01/08/2022   IR PERCUTANEOUS ART THROMBECTOMY/INFUSION INTRACRANIAL INC DIAG ANGIO  01/08/2022   IRRIGATION AND DEBRIDEMENT FOOT Left 09/04/2021   Procedure: IRRIGATION AND DEBRIDEMENT FOOT AND CLOSURE;  Surgeon: Edwin Cap, DPM;  [facility];  Service: Podiatry;  Laterality: Left;   LEFT HEART CATH AND CORONARY ANGIOGRAPHY N/A 07/26/2022   Procedure: LEFT HEART CATH AND CORONARY ANGIOGRAPHY;  Surgeon: Swaziland, Peter M, MD;  Location: [facility] INVASIVE CV LAB;  Service: Cardiovascular;  Laterality: N/A;   METATARSAL OSTEOTOMY Left 07/21/2022   Procedure: LEFT FOOT EXCISION OF FIFTH METATARSAL WITH DEBRIDEMENT OF ULCER, POSSIBLE BONE BIOPSY OF THE FOURTH METATARSAL, POSSIBLE FOURTH RAY AMPUTATION OF LEFT TOEAND METATARSAL;  Surgeon: Vivi Barrack, DPM;  [facility]: [facility];  Service: Podiatry;  Laterality: Left;   PERIPHERAL VASCULAR BALLOON ANGIOPLASTY Left 06/10/2022   Procedure: PERIPHERAL VASCULAR BALLOON ANGIOPLASTY;  Surgeon: Cephus Shelling, MD;  [facility];  Service: Cardiovascular;  Laterality: Left;  AT and DP   PERIPHERAL VASCULAR INTERVENTION Left 06/10/2022   Procedure: PERIPHERAL VASCULAR INTERVENTION;  Surgeon: Cephus Shelling, MD;  [facility]: [facility];  Service: Cardiovascular;  Laterality: Left;  SFA   RADIOLOGY WITH ANESTHESIA N/A 01/08/2022   Procedure: IR WITH ANESTHESIA;  Surgeon: Radiologist, Medication, MD;  [facility]: [facility];  Service: Radiology;  Laterality: N/A;   THROMBECTOMY BRACHIAL ARTERY Right 11/18/2021   Procedure: RIGHT BRACHIAL, RADIAL, & ULNAR ARTERY THROMBECTOMY.;  Surgeon: Cephus Shelling, MD;  Location: [facility];  Service: Vascular;  Laterality: Right;   TUBAL LIGATION     Past Medical History:  Diagnosis Date   Anemia    CAD (coronary artery disease)    a. s/p cath in 03/2014 showing 30% mid-LAD, moderate to severe  disease along small D1, patent LCx, moderate to severe distal OM2 stenosis and moderate diffuse diease along RCA not amenable to PCI   CHF (congestive heart failure)    a. EF 55-60% in 12/2019 b. EF at 35-40% by echo in 05/2020   CKD (chronic kidney disease) stage 4, GFR 15-29 ml/min    Diabetes mellitus without complication    Myocardial infarction    Neuropathy    PVD (peripheral vascular disease)    Stroke 01/2022   L hand weakness   BP 130/84   Pulse 74   Ht  (1.651 m)   LMP 07/18/2022 (Approximate)   SpO2 98%   BMI 38.77 kg/m   Opioid Risk Score:   Fall Risk Score:  `1  Depression screen [facility] 2/9     08/20/2022    9:45 AM 08/11/2022    2:27 PM 07/28/2022    3:10 PM 06/01/2022   10:43 AM 05/31/2022    3:01 PM 04/20/2022   11:41 AM 04/19/2022    2:32 PM  Depression screen PHQ 2/9  Decreased Interest 0 1 0 0 1 0 0  Down, Depressed, Hopeless 0 0 0  PHQ - 2 Score 0 0 0  Altered sleeping  3 0 2  0   Tired, decreased energy  3 0 0  0   Change in appetite  0 0 0  0   Feeling bad or failure about yourself   3 0 0  0   Trouble concentrating  3 1 0  0   Moving slowly or fidgety/restless  0 0 0  0   Suicidal thoughts  0 0 0  0   PHQ-9 Score  0   Difficult doing work/chores  Very difficult           Review of Systems  Musculoskeletal:  Positive for back pain.       Left shoulder pain Left foot pain   All other systems reviewed and are negative.     Objective:   Physical Exam   Gen: no distress, normal appearing HEENT: oral mucosa pink and moist, NCAT Cardio: Reg rate Chest: normal effort, normal rate of breathing Abd: soft, non-distended Ext: Significant edema noted in LUE particularly the hand Psych: pleasant, normal affect Skin: intact Neuro: Alert and awake, makes eye contact, follows commands, using post-op shoe on the left, left facial weakness Strength 5 out of 5 right upper extremity and right lower extremity Strength 2 out  of 5 left shoulder abduction and elbow extension, elbow flexion 3 out of 5, finger flexion 3-4 out of 5 Strength left lower extremity 4 out of 5 throughout -Sensation intact light touch in all 4 extremities Musculoskeletal: Subluxation left shoulder, pain with shoulder abduction around end range of  motion. Increased tone in finger flexors  elbow flexors and pronators  Cam boot left lower extremity           Assessment & Plan:    1. Right MCA infarct status post thrombectomy, right ICA dissection with stent placement. -I think patient would benefit from evaluation for vagal nerve stimulation, OT consult placed   2 shoulder subluxation left and shoulder pain/arm pain -Continue Voltaren gel, will schedule for shoulder injection-defer to next visit -Patient reports oxycodone is helping shoulder pain in addition to left foot pain  -UDS , pain agreement last visit -Order oxycodone 5mg  daily prn, she uses the medication at night -Will start duloxetine 30 mg for additional pain control, this may also help her mood which she reports is often decreased   3 Spasticity left upper extremity.   -Continue to monitor may need Botox at a later time   4. : Left foot osteomyelitis s/p ray amputation  -Continue follow-up with Dr. Lilian Kapur, she says is is on antibiotics  5. LUE edema -Advised to get compression sleeve -Will order Vascular U/S to r/u DVT

## 2022-08-20 NOTE — Telephone Encounter (Signed)
Dorothy from Cardiovascular called stating the patients order was put in as Radiology but needs to be put in has Vascular. She also needs to know if patient is having any pain and swelling. Why is she getting [name]  of LUE?

## 2022-08-23 ENCOUNTER — Ambulatory Visit (HOSPITAL_COMMUNITY)
Admission: RE | Admit: 2022-08-23 | Discharge: 2022-08-23 | Disposition: A | Discharge status: Home / Self Care | Payer: BC Managed Care – PPO | Source: Ambulatory Visit | Attending: Surgery | Admitting: Surgery

## 2022-08-23 DIAGNOSIS — M7989 Other specified soft tissue disorders: Secondary | ICD-10-CM | POA: Insufficient documentation

## 2022-08-24 ENCOUNTER — Ambulatory Visit: Payer: BC Managed Care – PPO | Attending: Physical Medicine & Rehabilitation | Admitting: Occupational Therapy

## 2022-08-24 DIAGNOSIS — I69354 Hemiplegia and hemiparesis following cerebral infarction affecting left non-dominant side: Secondary | ICD-10-CM | POA: Diagnosis not present

## 2022-08-24 DIAGNOSIS — I639 Cerebral infarction, unspecified: Secondary | ICD-10-CM | POA: Insufficient documentation

## 2022-08-24 NOTE — Therapy (Addendum)
OUTPATIENT OCCUPATIONAL THERAPY VIVISTIM EVALUATION  Patient Name: Lauren [name] MRN: [id number] DOB:[birth year], 42 y.o., female Today's Date: 08/24/2022  PCP: Billie Lade, MD REFERRING PROVIDER: Fanny Dance, MD   OT End of Session - 08/24/22 1108     Visit Number 1    Number of Visits 1    Authorization Type BC/BS    OT Start Time 0930    OT Stop Time 1025    OT Time Calculation (min) 55 min    Activity Tolerance Patient tolerated treatment well    Behavior During Therapy [facility] for tasks assessed/performed             Past Medical History:  Diagnosis Date   Anemia    CAD (coronary artery disease)    a. s/p cath in 03/2014 showing 30% mid-LAD, moderate to severe disease along small D1, patent LCx, moderate to severe distal OM2 stenosis and moderate diffuse diease along RCA not amenable to PCI   CHF (congestive heart failure)    a. EF 55-60% in 12/2019 b. EF at 35-40% by echo in 05/2020   CKD (chronic kidney disease) stage 4, GFR 15-29 ml/min    Diabetes mellitus without complication    Myocardial infarction    Neuropathy    PVD (peripheral vascular disease)    Stroke 01/2022   L hand weakness   Past Surgical History:  Procedure Laterality Date   ABDOMINAL AORTOGRAM W/LOWER EXTREMITY N/A 06/10/2022   Procedure: ABDOMINAL AORTOGRAM W/LOWER EXTREMITY;  Surgeon: Cephus Shelling, MD;  Location: [facility] IN[facility]ASIVE CV [facility];  Service: Cardiovascular;  Laterality: N/A;   AMPUTATION Left 09/02/2021   Procedure: AMPUTATION RAY;  Surgeon: Edwin Cap, DPM;  Location: [facility] OR;  Service: Podiatry;  Laterality: Left;  sagittal saw, 3L bag saline & Pulse   Cardiac catherization     CHOLECYSTECTOMY     I & D EXTREMITY Left 08/30/2021   Procedure: IRRIGATION AND DEBRIDEMENT WITH BONE BIOPSY;  Surgeon: Candelaria Stagers, DPM;  Location: [facility] OR;  Service: Podiatry;  Laterality: Left;   IR ANGIO VERTEBRAL SEL SUBCLAVIAN INNOMINATE UNI L MOD SED  01/18/2022   IR CT HEAD LTD  01/08/2022    IR INTRA CRAN STENT  01/08/2022   IR PERCUTANEOUS ART THROMBECTOMY/INFUSION INTRACRANIAL INC DIAG ANGIO  01/08/2022   IRRIGATION AND DEBRIDEMENT FOOT Left 09/04/2021   Procedure: IRRIGATION AND DEBRIDEMENT FOOT AND CLOSURE;  Surgeon: Edwin Cap, DPM;  Location: [facility] OR;  Service: Podiatry;  Laterality: Left;   LEFT HEART CATH AND CORONARY ANGIOGRAPHY N/A 07/26/2022   Procedure: LEFT HEART CATH AND CORONARY ANGIOGRAPHY;  Surgeon: [place], Peter M, MD;  Location: [facility] INVASIVE CV LAB;  Service: Cardiovascular;  Laterality: N/A;   METATARSAL OSTEOTOMY Left 07/21/2022   Procedure: LEFT FOOT EXCISION OF FIFTH METATARSAL WITH DEBRIDEMENT OF ULCER, POSSIBLE BONE BIOPSY OF THE FOURTH METATARSAL, POSSIBLE FOURTH RAY AMPUTATION OF LEFT TOEAND METATARSAL;  Surgeon: Vivi Barrack, DPM;  Location: [facility] OR;  Service: Podiatry;  Laterality: Left;   PERIPHERAL VASCULAR BALLOON ANGIOPLASTY Left 06/10/2022   Procedure: PERIPHERAL VASCULAR BALLOON ANGIOPLASTY;  Surgeon: Cephus Shelling, MD;  Location: [facility];  Service: Cardiovascular;  Laterality: Left;  AT and DP   PERIPHERAL VASCULAR INTERVENTION Left 06/10/2022   Procedure: PERIPHERAL VASCULAR INTERVENTION;  Surgeon: Cephus Shelling, MD;  [facility] INVASIVE CV LAB;  Service: Cardiovascular;  Laterality: Left;  SFA   RADIOLOGY WITH ANESTHESIA N/A 01/08/2022   Procedure: IR WITH ANESTHESIA;  Surgeon: Radiologist,  Medication, MD;  Location: [facility] OR;  Service: Radiology;  Laterality: N/A;   THROMBECTOMY BRACHIAL ARTERY Right 11/18/2021   Procedure: RIGHT BRACHIAL, RADIAL, & ULNAR ARTERY THROMBECTOMY.;  Surgeon: Cephus Shelling, MD;  Location: [facility] OR;  Service: Vascular;  Laterality: Right;   TUBAL LIGATION     Patient Active Problem List   Diagnosis Date Noted   Lung nodule seen on imaging study 08/11/2022   Elevated troponin level not due myocardial infarction -> level still declining from previous infarction 08/03/2022   Diarrhea 08/03/2022    Nausea and vomiting 08/02/2022   Osteomyelitis 07/20/2022   Diabetic foot infection 07/19/2022   PVD (peripheral vascular disease) 07/19/2022   DNR (do not resuscitate) 07/19/2022   Critical limb ischemia of left lower extremity 06/01/2022   Open abdominal wall wound, initial encounter 04/28/2022   Insomnia 04/07/2022   Encounter for therapeutic drug monitoring 04/07/2022   Ulcer of left foot due to type 2 diabetes mellitus    Acute on chronic combined systolic and diastolic CHF (congestive heart failure)    Ischemic cardiomyopathy    Acute right MCA stroke 01/28/2022   Elevated troponin    Aspiration pneumonia 01/18/2022   Acute respiratory failure with hypoxia    HFrEF (heart failure with reduced ejection fraction)    Right carotid artery occlusion 01/08/2022   Internal carotid artery occlusion, 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   Menorrhagia with irregular cycle 12/15/2021   Pregnancy examination or test, negative result 12/15/2021   Encounter for IUD insertion 12/15/2021   Critical ischemia of upper extremity 11/18/2021   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 11/18/2021   Cellulitis of left foot 09/02/2021   Acute osteomyelitis of left foot 09/02/2021   Foot ulcer 08/27/2021   Sepsis 08/27/2021   Hypokalemia 08/27/2021   Cellulitis 06/18/2021   Chronic combined systolic and diastolic heart failure 06/18/2021   History of CVA (cerebrovascular accident) 06/18/2021   Encounter for cervical Pap smear with pelvic exam 07/23/2020   Menorrhagia with regular cycle 07/23/2020   Iron deficiency anemia due to chronic blood loss 07/23/2020   Menstrual cramps 07/23/2020   Class 2 obesity 06/18/2020   CAD (coronary artery disease) 06/18/2020   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 06/18/2020   Angina at rest 06/18/2020   Mixed hyperlipidemia    Class 2 obesity due to excess calories without serious comorbidity with body mass index (BMI) of 39.0 to 39.9 in adult    Acute renal failure superimposed  on stage 4 chronic kidney disease 01/01/2020   Acute ischemic stroke 01/01/2020   Tobacco abuse 01/01/2020   Cerebrovascular accident (CVA) 12/31/2019   Left sided numbness 12/31/2019   Vitamin D deficiency 07/24/2015   Primary hypertension 03/17/2015   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 Underwent L 5th ray resection and bone biopsy of 4th metatarsal, wound excision on 3/20. Potential L heart cath on 3/25. PMH: CVA requiring thrombectomy, CAD, CKD, CHF, DM **RT MCA CVA  01/08/22 w/ residual Lt hemiparesis  PRECAUTIONS: Other: mod to severe edema Lt hand  WEIGHT BEARING RESTRICTIONS No  PAIN:  Are you having pain?  Pt does have pain in LUE at times, but reports no pain today  FALLS: Has patient fallen in last 6 months? No  LIVING ENVIRONMENT: Lives with: lives with their family Lives in: Mobile home - just got kicked Stairs:  ramped entry Has following equipment at home: shower chair and elevated toilet seat  PLOF: Independent  PATIENT GOALS to qualify for vivistim and reduce Lt hand edema  OBJECTIVE:   HAND DOMINANCE: Right  SENSATION: WFL  MUSCLE TONE: LUE: Hypertonic  COGNITION: Overall cognitive status:  decreased memory    PATIENT EDUCATION: Education details: vivistim criteria  Person educated: Patient and sister Education method: Explanation Education  comprehension: verbalized understanding  --------------------------------------------------------------------------------------------------------------------- Golda Acre: IADL Scale  A. Ability to Use Telephone  Operates telephone on own initiative-looks up and dials numbers, etc. - 1  B. Shopping Needs to be accompanied on any shopping trip - 0  C. Food Preparation Needs to have meals prepared and served - 0   D. Housekeeping Does not participate in any housekeeping tasks - 0  E. Laundry  All laundry must be done by others - 0  F. Mode of Transportation  Travel limited to taxi or automobile with assistance of another - 0  G. Responsibility for Own Medications Is responsible for taking medication in correct dosages at correct time - 1  H. Ability to [employer] day to day purchases, but needs help with banking, major purchases - 1   TOTAL LAWTON BRODY SCORE: 3/8  -----------------------------------------------------------  Myrtis Ser Index of Independence in ADL INDEPENDENCE (1) DEPENDENCE (0)  NO supervision, direction or personal assistance WITH supervision, direction, personal assistance or total care   ACTIVITIES 1 or 0  Bathing 1  Dressing 0  Toileting 1  Transferring 1  Continence 1  Feeding 1    KATZ TOTAL: 5/6  -----------------------------------------------------------  FUGL-MEYER ASSESSMENT   A. Upper Extremity  I. Reflex Activity: Flexors (elbow) 2  Extensors (triceps) 2  Total 4/4     II. Volitional Movement within Synergies: Flexor Synergy:   Shoulder Retraction 1  Shoulder Elevation 1  Shoulder Abduction 1  External Rotation 1  Elbow Flexion 1  Forearm Supination 1  Extensor Synergy:   Shoulder Adduction/Internal Rotation 1  Elbow Extension 2  Forearm Pronation 1  Total 10/18   III. Volitional Mixing of Synergies: Hand to Lumbar Spine 1  Shoulder Flexion 1  Pronation/Supination 1  Total 3/6   IV. Volitional Movement  with Little to No Synergy Shoulder Abduction 1  Shoulder Flexion 90+ 0  Pronation/Supination 0  Total 1/6    B. Wrist Stability of Wrist at 15* 2  Repeated Flex/Ext 1  Circumduction 0  Stability at 15*Wrist, 0*Elbow 1  Flexion/Extension - Elbow 0* 1  Total 5/10   C. Hand Mass Flexion 2  Mass Extension 1  Total 3/4    D. Grasp Hook  0  Thumb Adduction - paper 1  Pincer - pen 0  Cylindrical - cup 0  Spherical - ball 1  Total 2/10   E. Coordination/Speed Tremor 2  Dysmetria 1  Time 0  Total 3/6    FUGL-MEYER ASSESSMENT: UPPER EXTREMITY A-E TOTAL: 31/66    ---------------------------------------------------------------------------------------------------------------------    ASSESSMENT:  CLINICAL IMPRESSION: Patient is a 42 yr old female referred to OT for assessment to determine qualification for  implantable nerve stimulator to address LUE functioning.  Completed Fugl Meyer UE assessment, as well as Myrtis Ser index of Independence in ADL, and Golda Acre IADL Scale.  Patient has adequate active movement and sensation throughout LUE with limitations in  supination, finger extension and higher shoulder flexion. Pt also limited by spasticity and edema Lt hand, and would recommend botox, however pt's spasticity is controlled enough to participate in therapy .  Patient is motivated to improve functional use and movement in her LUE arm.  PERFORMANCE DEFICITS in functional skills including ADLs, IADLs, coordination, edema, tone, ROM, strength, Fine motor control, body mechanics, and UE functional use, .   IMPAIRMENTS are limiting patient from ADLs, IADLs, leisure, and social participation.   COMORBIDITIES has co-morbidities such as recent foot surgery  that affects occupational performance. Patient will benefit from skilled OT to address above impairments and improve overall function.  MODIFICATION OR ASSISTANCE TO COMPLETE EVALUATION: No modification of tasks or assist  necessary to complete an evaluation.  OT OCCUPATIONAL PROFILE AND HISTORY: Problem focused assessment: Including review of records relating to presenting problem.  CLINICAL DECISION MAKING: Moderate - several treatment options, min-mod task modification necessary  REHAB POTENTIAL: Good  EVALUATION COMPLEXITY: Low  PLAN:  Pt qualifies for vivistim from a rehab perspective based on Fugl-Meyer score. Will determine appropriateness of candidacy for Vivistim and reevaluate and develop plan upon return if determined appropriate. **If pt qualifies medically, would also highly recommend botox to finger flexors prior to beginning of rehab    Sheran Lawless, OT 08/24/2022, 11:09 AM

## 2022-08-25 ENCOUNTER — Encounter: Payer: BC Managed Care – PPO | Admitting: Podiatry

## 2022-08-31 ENCOUNTER — Ambulatory Visit (INDEPENDENT_AMBULATORY_CARE_PROVIDER_SITE_OTHER): Payer: BC Managed Care – PPO | Admitting: Physician Assistant

## 2022-08-31 VITALS — BP 135/92 | HR 82 | Temp 97.5°F | Resp 20 | Ht 65.0 in | Wt 223.1 lb

## 2022-08-31 DIAGNOSIS — I70222 Atherosclerosis of native arteries of extremities with rest pain, left leg: Secondary | ICD-10-CM | POA: Diagnosis not present

## 2022-08-31 DIAGNOSIS — I70244 Atherosclerosis of native arteries of left leg with ulceration of heel and midfoot: Secondary | ICD-10-CM | POA: Diagnosis not present

## 2022-08-31 NOTE — Progress Notes (Signed)
Office Note     CC:  follow up Requesting Provider:  Billie Lade, MD  HPI: Lauren [name] is a 42 y.o. (1980/08/04) female who presents for follow up of left foot wound. She has been under the management of the wound care clinic and her Podiatrist. Dr. Chestine Spore had seen her in January of this year and at the time she had ABIs of 0.81 with an abnormal dampened monophasic waveform at the ankle and no palpable pedal pulses on exam. He recommended Angiography to evaluate her perfusion to see if she would be able to heal her foot wound.  On 06/10/22 she underwent  CO2 angiogram of left lower extremity with left AT and DP angioplasty as well as angioplasty of the mid left SFA stent. She was noted to have significant small vessel disease in her left foot. She has since undergone excision of left 5th metatarsal with debridement of ulcer and 4th ray amputation for osteomyelitis by Dr. Ardelle Anton.   She is here with her husband. She reports that her legs are feeling well. She is not having any pain on ambulation, rest pain or new wounds. She says her activity has been less however because of her foot healing and just discomfort with ambulating on her foot. She has her next Podiatry follow up tomorrow to have her sutures removed with Dr. Lilian Kapur.   She has history of acutely ischemic right upper extremity requiring a right brachial, radial and ulnar artery thrombectomy on 11/18/2021. She is without any right upper extremity weakness, numbness, coldness or pain.  She was discharged on Eliquis and is currently on Coumadin followed by Dr. Wyline Mood with cardiology and also in the Coumadin clinic at [facility].   Past Medical History:  Diagnosis Date   Anemia    CAD (coronary artery disease)    a. s/p cath in 03/2014 showing 30% mid-LAD, moderate to severe disease along small D1, patent LCx, moderate to severe distal OM2 stenosis and moderate diffuse diease along RCA not amenable to PCI   CHF (congestive heart failure)  (HCC)    a. EF 55-60% in 12/2019 b. EF at 35-40% by echo in 05/2020   CKD (chronic kidney disease) stage 4, GFR 15-29 ml/min (HCC)    Diabetes mellitus without complication (HCC)    Myocardial infarction (HCC)    Neuropathy    PVD (peripheral vascular disease) (HCC)    Stroke (HCC) 01/2022   L hand weakness    Past Surgical History:  Procedure Laterality Date   ABDOMINAL AORTOGRAM W/LOWER EXTREMITY N/A 06/10/2022   Procedure: ABDOMINAL AORTOGRAM W/LOWER EXTREMITY;  Surgeon: Cephus Shelling, MD;  Location: [facility] INVASIVE CV LAB;  Service: Cardiovascular;  Laterality: N/A;   AMPUTATION Left 09/02/2021   Procedure: AMPUTATION RAY;  Surgeon: Edwin Cap, DPM;  Location: [facility] OR;  Service: Podiatry;  Laterality: Left;  sagittal saw, 3L bag saline & Pulse   Cardiac catherization     CHOLECYSTECTOMY     I & D EXTREMITY Left 08/30/2021   Procedure: IRRIGATION AND DEBRIDEMENT WITH BONE BIOPSY;  Surgeon: Candelaria Stagers, DPM;  Location: [facility] OR;  Service: Podiatry;  Laterality: Left;   IR ANGIO VERTEBRAL SEL SUBCLAVIAN INNOMINATE UNI L MOD SED  01/18/2022   IR CT HEAD LTD  01/08/2022   IR INTRA CRAN STENT  01/08/2022   IR PERCUTANEOUS ART THROMBECTOMY/INFUSION INTRACRANIAL INC DIAG ANGIO  01/08/2022   IRRIGATION AND DEBRIDEMENT FOOT Left 09/04/2021   Procedure: IRRIGATION AND DEBRIDEMENT FOOT AND CLOSURE;  Surgeon: Edwin Cap, DPM;  Location: [facility] OR;  Service: Podiatry;  Laterality: Left;   LEFT HEART CATH AND CORONARY ANGIOGRAPHY N/A 07/26/2022   Procedure: LEFT HEART CATH AND CORONARY ANGIOGRAPHY;  Surgeon: [place], Peter M, MD;  Location: [facility] INVASIVE CV LAB;  Service: Cardiovascular;  Laterality: N/A;   METATARSAL OSTEOTOMY Left 07/21/2022   Procedure: LEFT FOOT EXCISION OF FIFTH METATARSAL WITH DEBRIDEMENT OF ULCER, POSSIBLE BONE BIOPSY OF THE FOURTH METATARSAL, POSSIBLE FOURTH RAY AMPUTATION OF LEFT TOEAND METATARSAL;  Surgeon: Vivi Barrack, DPM;  Location: [facility] OR;  Service: Podiatry;   Laterality: Left;   PERIPHERAL VASCULAR BALLOON ANGIOPLASTY Left 06/10/2022   Procedure: PERIPHERAL VASCULAR BALLOON ANGIOPLASTY;  Surgeon: Cephus Shelling, MD;  Location: [facility];  Service: Cardiovascular;  Laterality: Left;  AT and DP   PERIPHERAL VASCULAR INTERVENTION Left 06/10/2022   Procedure: PERIPHERAL VASCULAR INTERVENTION;  Surgeon: Cephus Shelling, MD;  [facility] INVASIVE CV LAB;  Service: Cardiovascular;  Laterality: Left;  SFA   RADIOLOGY WITH ANESTHESIA N/A 01/08/2022   Procedure: IR WITH ANESTHESIA;  Surgeon: Radiologist, Medication, MD;  Location: [facility] OR;  Service: Radiology;  Laterality: N/A;   THROMBECTOMY BRACHIAL ARTERY Right 11/18/2021   Procedure: RIGHT BRACHIAL, RADIAL, & ULNAR ARTERY THROMBECTOMY.;  Surgeon: Cephus Shelling, MD;  Location: [facility] OR;  Service: Vascular;  Laterality: Right;   TUBAL LIGATION      Social History   Socioeconomic History   Marital status: Married    Spouse name: Harvel Quale   Number of children: 2   Years of education: 12   Highest education level: 12th grade  Occupational History    Comment: Full time   Occupation: CNA at [employer]    Comment: applying for disability  Tobacco Use   Smoking status: Former    Packs/day: 1.00    Years: 15.00    Additional pack years: 0.00    Total pack years: 15.00    Types: Cigarettes    Quit date: 01/2022    Years since quitting: 0.6    Passive exposure: Past   Smokeless tobacco: Never  Vaping Use   Vaping Use: Never used  Substance and Sexual Activity   Alcohol use: Yes    Comment: occ   Drug use: No   Sexual activity: Yes    Birth control/protection: Surgical    Comment: tubal   Other Topics Concern   Not on file  Social History Narrative   Lives with husband and kids   Right handed   Drinks 9+ cups caffeine daily   Social Determinants of Health   Financial Resource Strain: Low Risk  (03/03/2022)   Overall Financial Resource Strain (CARDIA)    Difficulty of  Paying Living Expenses: Not very hard  Recent Concern: Physicist, medical Strain - High Risk (01/14/2022)   Overall Financial Resource Strain (CARDIA)    Difficulty of Paying Living Expenses: Hard  Food Insecurity: No Food Insecurity (08/06/2022)   Hunger Vital Sign    Worried About Running Out of Food in the Last Year: Never true    Ran Out of Food in the Last Year: Never true  Transportation Needs: No Transportation Needs (08/03/2022)   PRAPARE - Administrator, Civil Service (Medical): No    Lack of Transportation (Non-Medical): No  Physical Activity: Inactive (03/03/2022)   Exercise Vital Sign    Days of Exercise per Week: 0 days    Minutes of Exercise per Session: 0 min  Stress:  No Stress Concern Present (03/03/2022)   [employer] of Occupational Health - Occupational Stress Questionnaire    Feeling of Stress : Only a little  Recent Concern: Stress - Stress Concern Present (12/22/2021)   [employer] of Occupational Health - Occupational Stress Questionnaire    Feeling of Stress : To some extent  Social Connections: Socially Integrated (03/03/2022)   Social Connection and Isolation Panel [NHANES]    Frequency of Communication with Friends and Family: More than three times a week    Frequency of Social Gatherings with Friends and Family: More than three times a week    Attends Religious Services: More than 4 times per year    Active Member of [employer] or Organizations: Yes    Attends Engineer, structural: More than 4 times per year    Marital Status: Married  Catering manager Violence: Not At Risk (08/03/2022)   Humiliation, Afraid, Rape, and Kick questionnaire    Fear of Current or Ex-Partner: No    Emotionally Abused: No    Physically Abused: No    Sexually Abused: No    Family History  Problem Relation Age of Onset   Thyroid disease Mother    Hypertension Mother    Hyperlipidemia Mother    CAD Mother    CVA Mother    Hyperlipidemia Father     Hypertension Father    CAD Father    Breast cancer Paternal Grandmother 69   Cancer Paternal Grandmother        lung    Current Outpatient Medications  Medication Sig Dispense Refill   acetaminophen (TYLENOL) 325 MG tablet Take 1-2 tablets (325-650 mg total) by mouth every 4 (four) hours as needed for mild pain. (Patient taking differently: Take 650 mg by mouth as needed for mild pain.) 100 tablet 0   atorvastatin (LIPITOR) 40 MG tablet Take 1 tablet (40 mg total) by mouth daily. Courtesy fill/ pt to get established with a primary MD for further refills 90 tablet 1   clopidogrel (PLAVIX) 75 MG tablet Take 1 tablet (75 mg total) by mouth daily. 30 tablet 11   Continuous Blood Gluc Sensor (DEXCOM G6 SENSOR) MISC Change sensor every 10 days as directed 9 each 3   diclofenac Sodium (VOLTAREN ARTHRITIS PAIN) 1 % GEL Apply 2 g topically 4 (four) times daily. (Patient taking differently: Apply 1 Application topically 2 (two) times daily as needed (pain).) 150 g 5   DULoxetine (CYMBALTA) 30 MG capsule Take 1 capsule (30 mg total) by mouth daily. 30 capsule 3   furosemide (LASIX) 40 MG tablet Take 40 mg daily as needed for swelling 90 tablet 3   hydrALAZINE (APRESOLINE) 25 MG tablet Take 0.5 tablets (12.5 mg total) by mouth 2 (two) times daily. 30 tablet 6   insulin aspart (NOVOLOG) 100 UNIT/ML injection Use with Omnipod for daily dose around 80 units daily 60 mL 3   Insulin Disposable Pump (OMNIPOD 5 G6 PODS, GEN 5,) MISC Change pod every 48-72 hours 6 each 3   Insulin Disposable Pump (OMNIPOD 5 G6 PODS, GEN 5,) MISC Change pod every 48-72 hours 6 each 3   isosorbide mononitrate (ISMO) 20 MG tablet Take 2 tablets (40 mg total) by mouth 2 (two) times daily. 60 tablet 1   levothyroxine (SYNTHROID) 125 MCG tablet Take 1 tablet (125 mcg total) by mouth daily at 6 (six) AM. 90 tablet 3   metoprolol succinate (TOPROL-XL) 100 MG 24 hr tablet Take 1 tablet (100 mg total)  by mouth daily. Take with or  immediately following a meal. 30 tablet 1   Multiple Vitamin (MULTIVITAMIN WITH MINERALS) TABS tablet Take 1 tablet by mouth daily.     nitroGLYCERIN (NITROSTAT) 0.4 MG SL tablet Place 1 tablet (0.4 mg total) under the tongue every 5 (five) minutes x 3 doses as needed for chest pain. 25 tablet 3   oxyCODONE (OXY IR/ROXICODONE) 5 MG immediate release tablet Take 1 tablet (5 mg total) by mouth daily as needed for severe pain. 30 tablet 0   pantoprazole (PROTONIX) 40 MG tablet Take 1 tablet (40 mg total) by mouth daily. Courtesy fill/ pt to get established with a primary MD for further refills (Patient taking differently: Take 40 mg by mouth daily as needed (reflux).) 90 tablet 1   polyethylene glycol (MIRALAX / GLYCOLAX) 17 g packet Take 17 g by mouth 2 (two) times daily. (Patient taking differently: Take 17 g by mouth as needed for moderate constipation.) 60 each 0   ranolazine (RANEXA) 1000 MG SR tablet Take 1 tablet (1,000 mg total) by mouth 2 (two) times daily. 60 tablet 1   SANTYL 250 UNIT/GM ointment Apply 1 Application topically daily.     scopolamine (TRANSDERM-SCOP) 1 MG/3DAYS Place 1 patch (1.5 mg total) onto the skin every 3 (three) days. 10 patch 0   senna-docusate (SENOKOT-S) 8.6-50 MG tablet Take 1-2 tablets by mouth daily as needed for mild constipation. 30 tablet 0   tiZANidine (ZANAFLEX) 4 MG tablet Take 1 tablet (4 mg total) by mouth at bedtime. Courtesy fill/ pt to get established with a primary MD for further refills 30 tablet 2   traZODone (DESYREL) 100 MG tablet Take 1 tablet (100 mg total) by mouth at bedtime. 90 tablet 0   warfarin (COUMADIN) 5 MG tablet TAKE ONE TABLET BY MOUTH DAILY EXCEPT 1 AND 1/2 TABLET ON WEDNESDAYS AND SATURDAYS OR AS DIRECTED (Patient taking differently: Take 5-7.5 mg by mouth every evening. TAKE ONE TABLET BY MOUTH DAILY EXCEPT 1 AND 1/2 TABLET ON WEDNESDAYS AND SATURDAYS OR AS DIRECTED) 40 tablet 5   No current facility-administered medications for this  visit.    Allergies  Allergen Reactions   Visipaque [Iodixanol] Nausea And Vomiting    Patient had vagal response during procedure became hypertensive, and bradycardic. CO2 used during procedure prior to contrast being used, this may be cause as well. Recommended premedicating prior to contrast being used in the future.    Wellbutrin [Bupropion] Hives   Cefepime Rash    Tolerates penicilllin   Ciprofloxacin Hcl Hives and Rash    Hives/rash at injection site; 01/15/22 tolerated IV cipro   Tape Rash     REVIEW OF SYSTEMS:  [X]  denotes positive finding, [ ]  denotes negative finding Cardiac  Comments:  Chest pain or chest pressure:    Shortness of breath upon exertion:    Short of breath when lying flat:    Irregular heart rhythm:        Vascular    Pain in calf, thigh, or hip brought on by ambulation:    Pain in feet at night that wakes you up from your sleep:     Blood clot in your veins:    Leg swelling:         Pulmonary    Oxygen at home:    Productive cough:     Wheezing:         Neurologic    Sudden weakness in arms or legs:  Sudden numbness in arms or legs:     Sudden onset of difficulty speaking or slurred speech:    Temporary loss of vision in one eye:     Problems with dizziness:         Gastrointestinal    Blood in stool:     Vomited blood:         Genitourinary    Burning when urinating:     Blood in urine:        Psychiatric    Major depression:         Hematologic    Bleeding problems:    Problems with blood clotting too easily:        Skin    Rashes or ulcers:        Constitutional    Fever or chills:      PHYSICAL EXAMINATION:  Vitals:   08/31/22 0938  BP: (!) 135/92  Pulse: 82  Resp: 20  Temp: (!) 97.5 F (36.4 C)  TempSrc: Temporal  SpO2: 99%  Weight: 223 lb 1.6 oz (101.2 kg)  Height: 5\' 5"  (1.651 m)    General:  WDWN in NAD; vital signs documented above Gait: Normal, wears Aircast on left foot HENT: WNL,  normocephalic Pulmonary: normal non-labored breathing , without wheezing Cardiac: regular HR Abdomen: soft, NT, no masses Vascular Exam/Pulses: Doppler Dp/ Pt/ peroneal signals in left foot. BLE edematous. Left 4th and 5th ray amputation site healing very well. Sutures intact as shown below  Extremities: without ischemic changes, without Gangrene , without cellulitis; without open wounds;  Musculoskeletal: no muscle wasting or atrophy  Neurologic: A&O X 3 Psychiatric:  The pt has Normal affect.   Non-Invasive Vascular Imaging:  07/20/22  +-------+-----------+-----------+------------+------------+  ABI/TBIToday's ABIToday's TBIPrevious ABIPrevious TBI  +-------+-----------+-----------+------------+------------+  Right 0.94       0.67                                 +-------+-----------+-----------+------------+------------+  Left  0.88       NA                                   +-------+-----------+-----------+------------+------------+   VAS [name] Lower extremity arterial duplex: Right Stent(s):  +---------------+---++----------++  Prox to Stent   +---------------+---++----------++  Proximal Stent  +---------------+---++----------++  Mid Stent       +---------------+---++----------++  Distal Stent    +---------------+---++----------++  Distal to Stent32monophasic  +---------------+---++----------++    +-----------+--------+-----+---------------+----------+--------------------  ----+  LEFT      PSV cm/sRatioStenosis       Waveform  Comments                   +-----------+--------+-----+---------------+----------+--------------------  ----+  CFA Mid    87                          monophasic                           +-----------+--------+-----+---------------+----------+--------------------  ----+  CFA Distal 109                         monophasic                            +-----------+--------+-----+---------------+----------+--------------------  ----+  DFA       90                          monophasic                           +-----------+--------+-----+---------------+----------+--------------------  ----+  SFA Prox   135                                                              +-----------+--------+-----+---------------+----------+--------------------  ----+  SFA Mid                                          Mid SFA Stent              +-----------+--------+-----+---------------+----------+--------------------  ----+  SFA Distal 116                         monophasic                           +-----------+--------+-----+---------------+----------+--------------------  ----+  POP Prox   151                         monophasic                           +-----------+--------+-----+---------------+----------+--------------------  ----+  POP Distal 94                          monophasic                           +-----------+--------+-----+---------------+----------+--------------------  ----+  TP Trunk   106                         monophasic                           +-----------+--------+-----+---------------+----------+--------------------  ----+  ATA Prox   57                          monophasic                           +-----------+--------+-----+---------------+----------+--------------------  ----+  ATA Mid    93                          monophasic                           +-----------+--------+-----+---------------+----------+--------------------  ----+  ATA Distal 329          50-74% stenosismonophasic3.5 ratio                  +-----------+--------+-----+---------------+----------+--------------------  ----+  PTA Prox   27  monophasicareas of occlusive                                                           disease with                                                                collaterization            +-----------+--------+-----+---------------+----------+--------------------  ----+  PTA Mid    53                          monophasic                           +-----------+--------+-----+---------------+----------+--------------------  ----+  PTA Distal 58                          monophasic                           +-----------+--------+-----+---------------+----------+--------------------  ----+  PERO Prox  70                          monophasic                           +-----------+--------+-----+---------------+----------+--------------------  ----+  PERO Mid   64                          monophasic                           +-----------+--------+-----+---------------+----------+--------------------  ----+  PERO Distal43                          monophasic                           +-----------+--------+-----+---------------+----------+--------------------  ----+  DP        104                                                              +-----------+--------+-----+---------------+----------+--------------------  ----+    Summary:  Left: 50-74% stenosis noted in the anterior tibial artery. Patent left mid SFA stent.    ASSESSMENT/PLAN:: 42 y.o. female here for follow up of left foot wound. She has been under the management of the wound care clinic and her Podiatrist. On 06/10/22 she underwent  CO2 angiogram of left lower extremity with left AT and DP angioplasty as well as angioplasty of the mid left SFA stent. She was noted to have significant small vessel disease in  her left foot. She has since undergone excision of left 5th metatarsal with debridement of ulcer and 4th ray amputation for osteomyelitis by Dr. Ardelle Anton on 07/21/22. The wound is healing well, sutures still in place. She had her next follow up with Dr.  Lilian Kapur tomorrow 5/1. Continue wound care per Dr. Mason Jim directions. She otherwise is without any rest pain, claudication, or new tissue loss. She has brisk  Doppler DP/ PT/ Pero signals on exam  - Duplex shows patent  left SFA stent. She does have some recurrent left AT disease with 50-74% stenosis - Her ABIs are improved compared to prior to ABI in January  - Continue statin, Plavix and coumadin  - She will follow up in 3 months with repeat ABI and RLE arterial duplex  Graceann Congress, PA-C [facility] [phone]  Clinic MD:   [facility]

## 2022-09-01 ENCOUNTER — Ambulatory Visit (INDEPENDENT_AMBULATORY_CARE_PROVIDER_SITE_OTHER): Payer: BC Managed Care – PPO | Admitting: Podiatry

## 2022-09-01 DIAGNOSIS — L97522 Non-pressure chronic ulcer of other part of left foot with fat layer exposed: Secondary | ICD-10-CM | POA: Diagnosis not present

## 2022-09-01 NOTE — Progress Notes (Signed)
  Subjective:  Patient ID: Lauren [name], female    DOB: Sep 09, 1980,  MRN: [id number]  Chief Complaint  Patient presents with   Diabetic Ulcer    POST OP FROM HOSPITAL SX   07/21/2022, left foot fifth metatarsal resection  42 y.o. female returns for post-op check.  She is doing okay they have been changing the dressing and applying Betadine to the incision  Review of Systems: Negative except as noted in the HPI. Denies N/V/F/Ch.   Objective:  There were no vitals filed for this visit. There is no height or weight on file to calculate BMI. Constitutional Well developed. Well nourished.  Vascular Foot warm and well perfused. Capillary refill normal to all digits.  Calf is soft and supple, no posterior calf or knee pain, negative Homans' sign  Neurologic Normal speech. Oriented to person, place, and time. Epicritic sensation to light touch grossly present bilaterally.  Dermatologic Central area of delayed healing still present with exposed subcutaneous tissue measuring 1.5 x 0.5 x 1.5 cm.  Fibrotic wound bed.  No signs of infection.  Mild drainage.  Orthopedic: Only mild edema, she has no tenderness to palpation noted about the surgical site.     Assessment:   1. Delayed wound healing    Plan:  Patient was evaluated and treated and all questions answered.  S/p foot surgery left -All sutures removed today.  Does have residual central ulceration.  Postdebridement measurements are noted above.  This was debrided in a full-thickness excisional manner to the subcutaneous layer using a sharp tissue nipper.  It was dressed with Prisma silver collagen dressings and a silicone foam border dressing.  Wound care supply order sent to [facility].  Return in 3 weeks for follow-up.  Continue daily changes.  May continue regular bathing but do not soak or immerse foot deep water  Return in about 3 weeks (around 08/31/2022) for wound care, suture removal.

## 2022-09-03 ENCOUNTER — Other Ambulatory Visit: Payer: Self-pay | Admitting: Internal Medicine

## 2022-09-03 ENCOUNTER — Other Ambulatory Visit: Payer: Self-pay

## 2022-09-03 MED ORDER — SCOPOLAMINE 1 MG/3DAYS TD PT72: 1.0000 | MEDICATED_PATCH | TRANSDERMAL | 0 refills | Status: DC

## 2022-09-06 ENCOUNTER — Other Ambulatory Visit: Payer: Self-pay

## 2022-09-06 DIAGNOSIS — I70244 Atherosclerosis of native arteries of left leg with ulceration of heel and midfoot: Secondary | ICD-10-CM

## 2022-09-06 DIAGNOSIS — I70222 Atherosclerosis of native arteries of extremities with rest pain, left leg: Secondary | ICD-10-CM

## 2022-09-07 ENCOUNTER — Ambulatory Visit: Payer: BC Managed Care – PPO | Attending: Physical Medicine & Rehabilitation | Admitting: *Deleted

## 2022-09-07 DIAGNOSIS — I639 Cerebral infarction, unspecified: Secondary | ICD-10-CM | POA: Diagnosis not present

## 2022-09-07 DIAGNOSIS — Z5181 Encounter for therapeutic drug level monitoring: Secondary | ICD-10-CM | POA: Diagnosis not present

## 2022-09-07 LAB — POCT INR: INR: 6.7 — AB (ref 2.0–3.0)

## 2022-09-07 NOTE — Patient Instructions (Addendum)
Hold warfarin tonight and tomorrow night, take 1/2 tablet Thursday night then resume 1 tablet daily except 1 1/2 tablets on Saturdays.  Recheck in 1 wk On Augmentin and Doxycycline x 25 days.  Finished 2 wks ago Pt denies S/S of bleeding or excessive bruising.  Bleeding and fall precautions discussed with pt and she verbalized understanding.

## 2022-09-08 ENCOUNTER — Ambulatory Visit: Payer: BC Managed Care – PPO | Attending: Cardiology | Admitting: Cardiology

## 2022-09-08 ENCOUNTER — Encounter: Payer: Self-pay | Admitting: Internal Medicine

## 2022-09-08 ENCOUNTER — Ambulatory Visit: Payer: BC Managed Care – PPO | Admitting: Internal Medicine

## 2022-09-08 ENCOUNTER — Encounter: Payer: Self-pay | Admitting: Cardiology

## 2022-09-08 VITALS — BP 140/85 | HR 76 | Ht 65.0 in | Wt 227.0 lb

## 2022-09-08 VITALS — BP 119/76 | HR 74 | Ht 65.0 in | Wt 227.0 lb

## 2022-09-08 DIAGNOSIS — M869 Osteomyelitis, unspecified: Secondary | ICD-10-CM

## 2022-09-08 DIAGNOSIS — E782 Mixed hyperlipidemia: Secondary | ICD-10-CM

## 2022-09-08 DIAGNOSIS — I10 Essential (primary) hypertension: Secondary | ICD-10-CM

## 2022-09-08 DIAGNOSIS — R7989 Other specified abnormal findings of blood chemistry: Secondary | ICD-10-CM

## 2022-09-08 DIAGNOSIS — G4700 Insomnia, unspecified: Secondary | ICD-10-CM

## 2022-09-08 DIAGNOSIS — I2510 Atherosclerotic heart disease of native coronary artery without angina pectoris: Secondary | ICD-10-CM

## 2022-09-08 DIAGNOSIS — R911 Solitary pulmonary nodule: Secondary | ICD-10-CM | POA: Diagnosis not present

## 2022-09-08 DIAGNOSIS — I5022 Chronic systolic (congestive) heart failure: Secondary | ICD-10-CM | POA: Diagnosis not present

## 2022-09-08 DIAGNOSIS — R110 Nausea: Secondary | ICD-10-CM

## 2022-09-08 MED ORDER — SCOPOLAMINE 1 MG/3DAYS TD PT72
1.0000 | MEDICATED_PATCH | TRANSDERMAL | 2 refills | Status: DC
Start: 2022-09-08 — End: 2022-11-17

## 2022-09-08 NOTE — Progress Notes (Signed)
Established Patient Office Visit  Subjective   Patient ID: Dannyelle Groves, female    DOB: 1980/07/30  Age: 42 y.o. MRN: [id number]  Chief Complaint  Patient presents with   Diabetes    Follow up   Cerebrovascular Accident    Follow up   Ms. Pari returns to care today for follow-up.  Last evaluated by me on 3/27 in the setting of recent hospital admission for osteomyelitis of the left foot s/p amputation of the fifth metatarsal on 3/20.  That admission was further complicated by chest pain.  LHC performed 3/25, notable for two-vessel occlusive disease with no suitable targets for revascularization.  Medical management recommended.  Trazodone is increased to 100 mg nightly at her last appointment with me.  In the interim she has also been seen by Dr. Barbaraann Faster on 4/4 for hospital follow-up after brief admission for chest pain.  Again, medical management recommended.  In the interim she has also been seen by cardiology, podiatry, vascular surgery, and PM&R.  Ms. Spreen reports feeling fairly well today.  She is asymptomatic and has no acute concerns to discuss.  Insomnia has improved since increasing trazodone.  She has completed antibiotics for treatment of osteomyelitis.  Past Medical History:  Diagnosis Date   Anemia    CAD (coronary artery disease)    a. s/p cath in 03/2014 showing 30% mid-LAD, moderate to severe disease along small D1, patent LCx, moderate to severe distal OM2 stenosis and moderate diffuse diease along RCA not amenable to PCI   CHF (congestive heart failure) (HCC)    a. EF 55-60% in 12/2019 b. EF at 35-40% by echo in 05/2020   CKD (chronic kidney disease) stage 4, GFR 15-29 ml/min (HCC)    Diabetes mellitus without complication (HCC)    Myocardial infarction (HCC)    Neuropathy    PVD (peripheral vascular disease) (HCC)    Stroke (HCC) 01/2022   L hand weakness   Past Surgical History:  Procedure Laterality Date   ABDOMINAL AORTOGRAM W/LOWER EXTREMITY N/A 06/10/2022    Procedure: ABDOMINAL AORTOGRAM W/LOWER EXTREMITY;  Surgeon: Cephus Shelling, MD;  Location: [facility]IVE [facility];  Service: Cardiovascular;  Laterality: N/A;   AMPUTATION Left 09/02/2021   Procedure: AMPUTATION RAY;  Surgeon: Edwin Cap, DPM;  Location: [facility] OR;  Service: Podiatry;  Laterality: Left;  sagittal saw, 3L bag saline & Pulse   Cardiac catherization     CHOLECYSTECTOMY     I & D EXTREMITY Left 08/30/2021   Procedure: IRRIGATION AND DEBRIDEMENT WITH BONE BIOPSY;  Surgeon: Candelaria Stagers, DPM;  Location: [facility] OR;  Service: Podiatry;  Laterality: Left;   IR ANGIO VERTEBRAL SEL SUBCLAVIAN INNOMINATE UNI L MOD SED  01/18/2022   IR CT HEAD LTD  01/08/2022   IR INTRA CRAN STENT  01/08/2022   IR PERCUTANEOUS ART THROMBECTOMY/INFUSION INTRACRANIAL INC DIAG ANGIO  01/08/2022   IRRIGATION AND DEBRIDEMENT FOOT Left 09/04/2021   Procedure: IRRIGATION AND DEBRIDEMENT FOOT AND CLOSURE;  Surgeon: Edwin Cap, DPM;  Location: [facility] OR;  Service: Podiatry;  Laterality: Left;   LEFT HEART CATH AND CORONARY ANGIOGRAPHY N/A 07/26/2022   Procedure: LEFT HEART CATH AND CORONARY ANGIOGRAPHY;  Surgeon: [place], Peter M, MD;  Location: [facility] INVASIVE CV LAB;  Service: Cardiovascular;  Laterality: N/A;   METATARSAL OSTEOTOMY Left 07/21/2022   Procedure: LEFT FOOT EXCISION OF FIFTH METATARSAL WITH DEBRIDEMENT OF ULCER, POSSIBLE BONE BIOPSY OF THE FOURTH METATARSAL, POSSIBLE FOURTH RAY AMPUTATION OF LEFT TOEAND METATARSAL;  Surgeon: Vivi Barrack, DPM;  Location: [facility] OR;  Service: Podiatry;  Laterality: Left;   PERIPHERAL VASCULAR BALLOON ANGIOPLASTY Left 06/10/2022   Procedure: PERIPHERAL VASCULAR BALLOON ANGIOPLASTY;  Surgeon: Cephus Shelling, MD;  Location: [facility];  Service: Cardiovascular;  Laterality: Left;  AT and DP   PERIPHERAL VASCULAR INTERVENTION Left 06/10/2022   Procedure: PERIPHERAL VASCULAR INTERVENTION;  Surgeon: Cephus Shelling, MD;  Location: [facility] INVASIVE CV LAB;  Service:  Cardiovascular;  Laterality: Left;  SFA   RADIOLOGY WITH ANESTHESIA N/A 01/08/2022   Procedure: IR WITH ANESTHESIA;  Surgeon: Radiologist, Medication, MD;  Location: [facility] OR;  Service: Radiology;  Laterality: N/A;   THROMBECTOMY BRACHIAL ARTERY Right 11/18/2021   Procedure: RIGHT BRACHIAL, RADIAL, & ULNAR ARTERY THROMBECTOMY.;  Surgeon: Cephus Shelling, MD;  Location: [facility] OR;  Service: Vascular;  Laterality: Right;   TUBAL LIGATION     Social History   Tobacco Use   Smoking status: Former    Packs/day: 1.00    Years: 15.00    Additional pack years: 0.00    Total pack years: 15.00    Types: Cigarettes    Quit date: 01/2022    Years since quitting: 0.6    Passive exposure: Past   Smokeless tobacco: Never  Vaping Use   Vaping Use: Never used  Substance Use Topics   Alcohol use: Yes    Comment: occ   Drug use: No   Family History  Problem Relation Age of Onset   Thyroid disease Mother    Hypertension Mother    Hyperlipidemia Mother    CAD Mother    CVA Mother    Hyperlipidemia Father    Hypertension Father    CAD Father    Breast cancer Paternal Grandmother 33   Cancer Paternal Grandmother        lung   Allergies  Allergen Reactions   Visipaque [Iodixanol] Nausea And Vomiting    Patient had vagal response during procedure became hypertensive, and bradycardic. CO2 used during procedure prior to contrast being used, this may be cause as well. Recommended premedicating prior to contrast being used in the future.    Wellbutrin [Bupropion] Hives   Cefepime Rash    Tolerates penicilllin   Ciprofloxacin Hcl Hives and Rash    Hives/rash at injection site; 01/15/22 tolerated IV cipro   Tape Rash      Review of Systems  Constitutional:  Negative for chills and fever.  HENT:  Negative for sore throat.   Respiratory:  Negative for cough and shortness of breath.   Cardiovascular:  Negative for chest pain, palpitations and leg swelling.  Gastrointestinal:  Negative for  abdominal pain, blood in stool, constipation, diarrhea, nausea and vomiting.  Genitourinary:  Negative for dysuria and hematuria.  Musculoskeletal:  Negative for myalgias.  Skin:  Negative for itching and rash.  Neurological:  Negative for dizziness and headaches.  Psychiatric/Behavioral:  Negative for depression and suicidal ideas.      Objective:     BP 119/76   Pulse 74   Ht 5\' 5"  (1.651 m)   Wt 227 lb (103 kg)   SpO2 97%   BMI 37.77 kg/m  BP Readings from Last 3 Encounters:  09/08/22 119/76  09/08/22 (!) 140/85  08/31/22 (!) 135/92   Physical Exam Vitals reviewed.  Constitutional:      Comments: Examined in wheelchair  HENT:     Head: Normocephalic and atraumatic.     Right Ear: External ear normal.  Left Ear: External ear normal.     Nose: Nose normal.     Mouth/Throat:     Mouth: Mucous membranes are moist.     Pharynx: Oropharynx is clear. No oropharyngeal exudate or posterior oropharyngeal erythema.  Eyes:     General: No scleral icterus.    Extraocular Movements: Extraocular movements intact.     Conjunctiva/sclera: Conjunctivae normal.     Pupils: Pupils are equal, round, and reactive to light.  Cardiovascular:     Rate and Rhythm: Normal rate and regular rhythm.     Pulses: Normal pulses.     Heart sounds: Murmur heard.  Pulmonary:     Effort: Pulmonary effort is normal.     Breath sounds: Normal breath sounds. No wheezing, rhonchi or rales.  Abdominal:     General: Abdomen is flat. Bowel sounds are normal.     Palpations: Abdomen is soft.  Musculoskeletal:     Cervical back: Normal range of motion.     Comments: Walking boot on left foot  Skin:    General: Skin is warm and dry.  Neurological:     Mental Status: She is alert and oriented to person, place, and time.     Cranial Nerves: No cranial nerve deficit.     Motor: Weakness (Left arm and leg.  Weakness is more prominent in the left upper extremity) present.  Psychiatric:        Mood and  Affect: Mood normal.        Behavior: Behavior normal.   Last CBC Lab Results  Component Value Date   WBC 10.0 08/11/2022   HGB 10.6 (L) 08/11/2022   HCT 33.4 (L) 08/11/2022   MCV 87 08/11/2022   MCH 27.5 08/11/2022   RDW 14.0 08/11/2022   PLT 567 (H) 08/11/2022   Last metabolic panel Lab Results  Component Value Date   GLUCOSE 97 08/11/2022   NA 140 08/11/2022   K 5.1 08/11/2022   CL 98 08/11/2022   CO2 25 08/11/2022   BUN 37 (H) 08/11/2022   CREATININE 2.42 (H) 08/11/2022   EGFR 25 (L) 08/11/2022   CALCIUM 9.1 08/11/2022   PHOS 4.4 01/29/2022   PROT 6.2 (L) 08/02/2022   ALBUMIN 2.4 (L) 08/02/2022   LABGLOB 3.2 04/20/2022   AGRATIO 1.0 (L) 04/20/2022   BILITOT 0.3 08/02/2022   ALKPHOS 95 08/02/2022   AST 25 08/02/2022   ALT 22 08/02/2022   ANIONGAP 9 08/05/2022   Last lipids Lab Results  Component Value Date   CHOL 118 07/26/2022   HDL 44 07/26/2022   LDLCALC 57 07/26/2022   LDLDIRECT 84.9 01/01/2020   TRIG 84 07/26/2022   CHOLHDL 2.7 07/26/2022   Last hemoglobin A1c Lab Results  Component Value Date   HGBA1C 7.0 (H) 07/19/2022   Last thyroid functions Lab Results  Component Value Date   TSH 17.600 (H) 04/20/2022   Last vitamin D Lab Results  Component Value Date   VD25OH 15.9 (L) 06/01/2022   Last vitamin B12 and Folate Lab Results  Component Value Date   VITAMINB12 433 04/20/2022   FOLATE 10.3 04/20/2022     Assessment & Plan:   Problem List Items Addressed This Visit       Primary hypertension    She is currently prescribed metoprolol succinate 100 mg daily, isosorbide mononitrate 40 mg twice daily, hydralazine 12.5 mg twice daily, and Lasix 40 mg daily for treatment of hypertension.  She reports hydralazine was increased to 25 mg  twice daily by cardiology earlier today.  BP in our office today is 119/76. -No medication changes today      Lung nodule seen on imaging study    Incidentally noted RLL nodule during recent admission.   Dedicated CT chest performed last month, revealing well-circumscribed 10 x 7 mm RLL nodule.  This has been present on prior imaging since 2010, but is gradually increasing in size.  Benign/nonaggressive etiology favored, however repeat CT recommended for 3-6 months. -Repeat CT chest at next follow-up appointment      Osteomyelitis [facility])    S/p amputation of the fifth metatarsal of the left foot in the setting of osteomyelitis on 3/20.  She has completed antibiotics (Augmentin/doxycycline).  And is followed by podiatry.  Last seen by podiatry on 5/1.  Sutures removed.  Will wound care measures reviewed.  She will return to care for follow-up on 5/23.      Insomnia    Symptoms have improved with increasing trazodone to 100 mg nightly. -No medication changes today      Abnormal TSH - Primary    TSH 17.6 on labs from December.  She is asymptomatic currently. -Repeat TSH/free T4 ordered today      Return in about 3 months (around 12/09/2022).   Billie Lade, MD

## 2022-09-08 NOTE — Patient Instructions (Signed)
Medication Instructions:  Your physician recommends that you continue on your current medications as directed. Please refer to the Current Medication list given to you today.  *If you need a refill on your cardiac medications before your next appointment, please call your pharmacy*   Lab Work: None If you have labs (blood work) drawn today and your tests are completely normal, you will receive your results only by: MyChart Message (if you have MyChart) OR A paper copy in the mail If you have any lab test that is abnormal or we need to change your treatment, we will call you to review the results.   Testing/Procedures: None   Follow-Up: At [facility], you and your health needs are our priority.  As part of our continuing mission to provide you with exceptional heart care, we have created designated Provider Care Teams.  These Care Teams include your primary Cardiologist (physician) and Advanced Practice Providers (APPs -  Physician Assistants and Nurse Practitioners) who all work together to provide you with the care you need, when you need it.  We recommend signing up for the patient portal called "MyChart".  Sign up information is provided on this After Visit Summary.  MyChart is used to connect with patients for Virtual Visits (Telemedicine).  Patients are able to view lab/test results, encounter notes, upcoming appointments, etc.  Non-urgent messages can be sent to your provider as well.   To learn more about what you can do with MyChart, go to [email or web address].    Your next appointment:   3 month(s)  Provider:   Jonathan Branch, MD    Other Instructions    

## 2022-09-08 NOTE — Progress Notes (Signed)
Clinical Summary Ms. Mcfalls is a 42 y.o.female seen today for follow up of the following medical problems.    1. Chronic systolic HF Jan 2022 echo: LVEF 35-40%, grade III dd, apex akinetic - imdur lowered to 15mg  daily due to headaches  - side effects of dizziness on higher beta blocker dosing, titrate slowly - avoiding ACe/ARB/ARNI/aldactone/SGLT2i given poor renal function. Had low bp's during recent admission, hydral stopped.      - 03/2014 cath: LVEF >60%, mid LAD 30%, D1 mod to severe small vessel, distally pruned, distal 50-60% disease. LCX patent, OM2, mild disease with distal mod to severe, RCA diffuse moderate disease not ameanble to PCI - given renal function we have not repeated her cath despite drop in LVEF 02/2020 nuclear stress: large periapical defect consistent with scar, mild apical anterolateral ischemia.   Echo reviewed , LVEF 35-40% with apical akinesis. I reviewed the echo which I had read in 12/2019 and compared images side by side. 12/2019 LVEF likely an overestimate and closer to 40%, difficult assessment given the base and mid ventricle move well and main dysfunction is focused in the apex.        Jan 2022 echo: LVEF 35-40%, apex akinetic 01/2022 echo: LVEF 35-40%, grade II dd.       07/2022 cath: ostial LAD 75%, prox to mid occluded LAD CTO. LCX 35%, RCA prox to mid 95%, distal occluded. No targets for revasc - 07/2022 echo: LVEF 30-35%    - using lasix 40mg  daily - chronic stable SOB/DOE. Chronic LE edema overall controlled        2. HTN -she is compliant with meds      3. CAD 03/2014 Records from [facility] - admitted with DKA, found to have elevated troponins - echo with normal LVEF 65-70% - during stress test had new LBBB, referred for cath - 03/2014 cath: LVEF >60%, mid LAD 30%, D1 mod to severe small vessel, distally pruned, distal 50-60% disease. LCX patent, OM2, mild disease with distal mod to severe, RCA diffuse moderate disease not ameanble to  PCI       - admitted 01/2020 with CVA, iin this setting elevated trop to 413. EKG without acute ischemic changes - echo with some apical hypokinesis - workup deferred to allow her time to recover from stroke, also thought was this could be a stress induced CM   02/2020 nuclear stress: large periapical defect consistent with scar, mild apical anterolateral ischemia.     Jan 2022 echo LVEF 35-40%, grade III dd, apex akinetic       -admit 07/2022 with chest NSTEMI - 07/2022 cath: ostial LAD 75%, prox to mid occluded LAD CTO. LCX 35%, RCA prox to mid 95%, distal occluded. No targets for revasc - 07/2022 echo: LVEF 30-35% - repeat admit 08/2022 with chest pain, managed hep gtt and NG gtt  -denies any significant recent angina - compliant with meds        3. CKD - followed by [facility]     4. History of CVA - admitted 01/2020 with CVA,   - admit 01/2022 with acute ischemic right MCA and punctuated left MCA/ACS infarct with right ICA occlusion - s/p repair with TICI, 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  - -Etiology unclear likely large vessel disease, cannot rule out cardioembolic source  -Hypercoagulable work-up showed abnormal lupus anticoagulant but was also on heparin, slightly increased rheumatoid factor  -She was previously on Eliquis and now switched to Lovenox  per oncology recommendation  - now on coumadin and plavix - no bleeding issues.          5. DM2 - followed by pcp   6. Hyperlipidemia - compliant with meds 07/2022 TC 118 TG 84 HDL 44 LDL 57   7. Upper extremity ischemia - admit 10/2021 with right brachial/radial/ulnar ischemic requriing thrombectomy - dischatged on eliquis combined with the plavix she was already on  -Transthoracic echocardiogram is negative for thrombus  -to f/u with vascular Dr Chestine Spore in February 2024       8. 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 s/p cath in 03/2014 showing 30% mid-LAD, moderate to severe disease along small D1, patent LCx, moderate to severe distal OM2 stenosis and moderate diffuse diease along RCA not amenable to PCI   CHF (congestive heart failure) (HCC)    a. EF 55-60% in 12/2019 b. EF at 35-40% by echo in 05/2020   CKD (chronic kidney disease) stage 4, GFR 15-29 ml/min (HCC)    Diabetes mellitus without complication (HCC)    Myocardial infarction (HCC)    Neuropathy    PVD (peripheral vascular disease) (HCC)    Stroke (HCC) 01/2022   L hand weakness     Allergies  Allergen Reactions   Visipaque [Iodixanol] Nausea And Vomiting    Patient had vagal response during procedure became hypertensive, and bradycardic. CO2 used during procedure prior to contrast being used, this may be cause as well. Recommended premedicating prior to contrast being used in the future.    Wellbutrin [Bupropion] Hives   Cefepime Rash    Tolerates penicilllin   Ciprofloxacin Hcl Hives and Rash    Hives/rash at injection site; 01/15/22 tolerated IV cipro   Tape Rash     Current Outpatient Medications  Medication Sig Dispense Refill   acetaminophen (TYLENOL) 325 MG tablet Take 1-2 tablets (325-650 mg total) by mouth every 4 (four) hours as needed for mild pain. (Patient taking differently: Take 650 mg by mouth as needed for mild pain.) 100 tablet 0   atorvastatin (LIPITOR) 40 MG tablet Take 1 tablet (40 mg total) by mouth daily. Courtesy fill/ pt to get established with a primary MD for further refills 90 tablet 1   clopidogrel (PLAVIX) 75 MG tablet Take 1 tablet (75 mg total) by mouth daily. 30 tablet 11   Continuous Blood Gluc Sensor (DEXCOM G6 SENSOR) MISC Change sensor every 10 days as directed 9 each 3   diclofenac Sodium (VOLTAREN ARTHRITIS PAIN) 1 % GEL Apply 2 g topically 4 (four) times daily. (Patient taking differently:  Apply 1 Application topically 2 (two) times daily as needed (pain).) 150 g 5   DULoxetine (CYMBALTA) 30 MG capsule Take 1 capsule (30 mg total) by mouth daily. 30 capsule 3   furosemide (LASIX) 40 MG tablet Take 40 mg daily as needed for swelling 90 tablet 3   hydrALAZINE (APRESOLINE) 25 MG tablet Take 0.5 tablets (12.5 mg total) by mouth 2 (two) times daily. 30 tablet 6   insulin aspart (NOVOLOG) 100 UNIT/ML injection Use with Omnipod for daily dose around 80 units daily 60 mL 3   Insulin Disposable Pump (OMNIPOD 5 G6 PODS, GEN 5,) MISC Change pod every 48-72 hours 6 each 3  Insulin Disposable Pump (OMNIPOD 5 G6 PODS, GEN 5,) MISC Change pod every 48-72 hours 6 each 3   isosorbide mononitrate (ISMO) 20 MG tablet Take 2 tablets (40 mg total) by mouth 2 (two) times daily. 60 tablet 1   levothyroxine (SYNTHROID) 125 MCG tablet Take 1 tablet (125 mcg total) by mouth daily at 6 (six) AM. 90 tablet 3   metoprolol succinate (TOPROL-XL) 100 MG 24 hr tablet Take 1 tablet (100 mg total) by mouth daily. Take with or immediately following a meal. 30 tablet 1   Multiple Vitamin (MULTIVITAMIN WITH MINERALS) TABS tablet Take 1 tablet by mouth daily.     nitroGLYCERIN (NITROSTAT) 0.4 MG SL tablet Place 1 tablet (0.4 mg total) under the tongue every 5 (five) minutes x 3 doses as needed for chest pain. 25 tablet 3   oxyCODONE (OXY IR/ROXICODONE) 5 MG immediate release tablet Take 1 tablet (5 mg total) by mouth daily as needed for severe pain. 30 tablet 0   pantoprazole (PROTONIX) 40 MG tablet Take 1 tablet (40 mg total) by mouth daily. Courtesy fill/ pt to get established with a primary MD for further refills (Patient taking differently: Take 40 mg by mouth daily as needed (reflux).) 90 tablet 1   polyethylene glycol (MIRALAX / GLYCOLAX) 17 g packet Take 17 g by mouth 2 (two) times daily. (Patient taking differently: Take 17 g by mouth as needed for moderate constipation.) 60 each 0   ranolazine (RANEXA) 1000 MG SR  tablet Take 1 tablet (1,000 mg total) by mouth 2 (two) times daily. 60 tablet 1   SANTYL 250 UNIT/GM ointment Apply 1 Application topically daily.     scopolamine (TRANSDERM-SCOP) 1 MG/3DAYS Place 1 patch (1.5 mg total) onto the skin every 3 (three) days. 10 patch 0   senna-docusate (SENOKOT-S) 8.6-50 MG tablet Take 1-2 tablets by mouth daily as needed for mild constipation. 30 tablet 0   tiZANidine (ZANAFLEX) 4 MG tablet Take 1 tablet (4 mg total) by mouth at bedtime. Courtesy fill/ pt to get established with a primary MD for further refills 30 tablet 2   traZODone (DESYREL) 100 MG tablet Take 1 tablet (100 mg total) by mouth at bedtime. 90 tablet 0   warfarin (COUMADIN) 5 MG tablet TAKE ONE TABLET BY MOUTH DAILY EXCEPT 1 AND 1/2 TABLET ON WEDNESDAYS AND SATURDAYS OR AS DIRECTED (Patient taking differently: Take 5-7.5 mg by mouth every evening. TAKE ONE TABLET BY MOUTH DAILY EXCEPT 1 AND 1/2 TABLET ON WEDNESDAYS AND SATURDAYS OR AS DIRECTED) 40 tablet 5   No current facility-administered medications for this visit.     Past Surgical History:  Procedure Laterality Date   ABDOMINAL AORTOGRAM W/LOWER EXTREMITY N/A 06/10/2022   Procedure: ABDOMINAL AORTOGRAM W/LOWER EXTREMITY;  Surgeon: Cephus Shelling, MD;  Location: [facility];  Service: Cardiovascular;  Laterality: N/A;   AMPUTATION Left 09/02/2021   Procedure: AMPUTATION RAY;  Surgeon: Edwin Cap, DPM;  Location: [facility] OR;  Service: Podiatry;  Laterality: Left;  sagittal saw, 3L bag saline & Pulse   Cardiac catherization     CHOLECYSTECTOMY     I & D EXTREMITY Left 08/30/2021   Procedure: IRRIGATION AND DEBRIDEMENT WITH BONE BIOPSY;  Surgeon: Candelaria Stagers, DPM;  Location: [facility] OR;  Service: Podiatry;  Laterality: Left;   IR ANGIO VERTEBRAL SEL SUBCLAVIAN INNOMINATE UNI L MOD SED  01/18/2022   IR CT HEAD LTD  01/08/2022   IR INTRA CRAN STENT  01/08/2022  IR PERCUTANEOUS ART THROMBECTOMY/INFUSION INTRACRANIAL INC DIAG ANGIO  01/08/2022    IRRIGATION AND DEBRIDEMENT FOOT Left 09/04/2021   Procedure: IRRIGATION AND DEBRIDEMENT FOOT AND CLOSURE;  Surgeon: Edwin Cap, DPM;  Location: [facility] OR;  Service: Podiatry;  Laterality: Left;   LEFT HEART CATH AND CORONARY ANGIOGRAPHY N/A 07/26/2022   Procedure: LEFT HEART CATH AND CORONARY ANGIOGRAPHY;  Surgeon: Swaziland, Peter M, MD;  Location: [facility] INVASIVE CV LAB;  Service: Cardiovascular;  Laterality: N/A;   METATARSAL OSTEOTOMY Left 07/21/2022   Procedure: LEFT FOOT EXCISION OF FIFTH METATARSAL WITH DEBRIDEMENT OF ULCER, POSSIBLE BONE BIOPSY OF THE FOURTH METATARSAL, POSSIBLE FOURTH RAY AMPUTATION OF LEFT TOEAND METATARSAL;  Surgeon: Vivi Barrack, DPM;  Location: [facility] OR;  Service: Podiatry;  Laterality: Left;   PERIPHERAL VASCULAR BALLOON ANGIOPLASTY Left 06/10/2022   Procedure: PERIPHERAL VASCULAR BALLOON ANGIOPLASTY;  Surgeon: Cephus Shelling, MD;  Location: [facility];  Service: Cardiovascular;  Laterality: Left;  AT and DP   PERIPHERAL VASCULAR INTERVENTION Left 06/10/2022   Procedure: PERIPHERAL VASCULAR INTERVENTION;  Surgeon: Cephus Shelling, MD;  Location: [facility] INVASIVE CV LAB;  Service: Cardiovascular;  Laterality: Left;  SFA   RADIOLOGY WITH ANESTHESIA N/A 01/08/2022   Procedure: IR WITH ANESTHESIA;  Surgeon: Radiologist, Medication, MD;  Location: [facility];  Service: Radiology;  Laterality: N/A;   THROMBECTOMY BRACHIAL ARTERY Right 11/18/2021   Procedure: RIGHT BRACHIAL, RADIAL, & ULNAR ARTERY THROMBECTOMY.;  Surgeon: Cephus Shelling, MD;  Location: [facility] OR;  Service: Vascular;  Laterality: Right;   TUBAL LIGATION       Allergies  Allergen Reactions   Visipaque [Iodixanol] Nausea And Vomiting    Patient had vagal response during procedure became hypertensive, and bradycardic. CO2 used during procedure prior to contrast being used, this may be cause as well. Recommended premedicating prior to contrast being used in the future.    Wellbutrin [Bupropion] Hives    Cefepime Rash    Tolerates penicilllin   Ciprofloxacin Hcl Hives and Rash    Hives/rash at injection site; 01/15/22 tolerated IV cipro   Tape Rash      Family History  Problem Relation Age of Onset   Thyroid disease Mother    Hypertension Mother    Hyperlipidemia Mother    CAD Mother    CVA Mother    Hyperlipidemia Father    Hypertension Father    CAD Father    Breast cancer Paternal Grandmother 65   Cancer Paternal Grandmother        lung     Social History Ms. Nash reports that she quit smoking about 8 months ago. Her smoking use included cigarettes. She has a 15.00 pack-year smoking history. She has been exposed to tobacco smoke. She has never used smokeless tobacco. Ms. Schwinghammer reports current alcohol use.   Review of Systems CONSTITUTIONAL: No weight loss, fever, chills, weakness or fatigue.  HEENT: Eyes: No visual loss, blurred vision, double vision or yellow sclerae.No hearing loss, sneezing, congestion, runny nose or sore throat.  SKIN: No rash or itching.  CARDIOVASCULAR: per hpi RESPIRATORY: No shortness of breath, cough or sputum.  GASTROINTESTINAL: No anorexia, nausea, vomiting or diarrhea. No abdominal pain or blood.  GENITOURINARY: No burning on urination, no polyuria NEUROLOGICAL: No headache, dizziness, syncope, paralysis, ataxia, numbness or tingling in the extremities. No change in bowel or bladder control.  MUSCULOSKELETAL: No muscle, back pain, joint pain or stiffness.  LYMPHATICS: No enlarged nodes. No history of splenectomy.  PSYCHIATRIC: No history of  depression or anxiety.  ENDOCRINOLOGIC: No reports of sweating, cold or heat intolerance. No polyuria or polydipsia.  Marland Kitchen   Physical Examination Today's Vitals   09/08/22 1121 09/08/22 1221  BP: (!) 158/84 (!) 140/85  Pulse: 76   SpO2: 98%   Weight: 227 lb (103 kg)   Height: 5\' 5"  (1.651 m)    Body mass index is 37.77 kg/m.  Gen: resting comfortably, no acute distress HEENT: no scleral  icterus, pupils equal round and reactive, no palptable cervical adenopathy,  CV: RRR, no mrg, no jvd Resp: Clear to auscultation bilaterally GI: abdomen is soft, non-tender, non-distended, normal bowel sounds, no hepatosplenomegaly MSK: extremities are warm, no edema.  Skin: warm, no rash Psych: appropriate affect   Diagnostic Studies 12/2019 echo IMPRESSIONS     1. Left ventricular ejection fraction, by estimation, is 55 to 60%. The  left ventricle has normal function. The left ventricle demonstrates  regional wall motion abnormalities (see scoring diagram/findings for  description). There is mild left ventricular   hypertrophy. Left ventricular diastolic parameters were normal. There is  moderate hypokinesis of the left ventricular, apical. There is sluggish  apical flow without clear formed thrombus.   2. Right ventricular systolic function is normal. The right ventricular  size is normal. There is mildly elevated pulmonary artery systolic  pressure.   3. The mitral valve is normal in structure. Trivial mitral valve  regurgitation. No evidence of mitral stenosis.   4. The inferior vena cava is normal in size with greater than 50%  respiratory variability, suggesting right atrial pressure of 3 mmHg.   5. The aortic valve is tricuspid. Aortic valve regurgitation is not  visualized. No aortic stenosis is present.     02/2020 nuclear stress No diagnostic ST segment changes to indicate ischemia. Large, severe intensity, periapical defect extending into the anterolateral wall and inferior septum that is largely fixed and consistent with infarct scar. There is a small region of apical anterolateral reversibility indicating a mild ischemic territory. This is a high risk study based on defect size although region is most consistent with scar and a mild region of peri-infarct ischemia. Nuclear stress EF: 48%. Large area of periapical akinesis.     Jan 2022 echo IMPRESSIONS     1.  The mid to distal anteroseptal wall is akinetic. The apex is akinetic  with some portions dyskinetic, there is no signs of apical thrombus with  use of echocontrast. The mid to distal inferoseptal wall is akinetic. Marland Kitchen  Left ventricular ejection fraction,   by estimation, is 35 to 40%. The left ventricle has moderately decreased  function. Left ventricular diastolic parameters are consistent with Grade  III diastolic dysfunction (restrictive). Elevated left atrial pressure.   2. Right ventricular systolic function is normal. The right ventricular  size is normal.   3. Left atrial size was mildly dilated.   4. The mitral valve is normal in structure. Mild mitral valve  regurgitation. No evidence of mitral stenosis.   5. The inferior vena cava is normal in size with greater than 50%  respiratory variability, suggesting right atrial pressure of 3 mmHg.   6. Limited echo to evaluate LV function and wall motion.         Assessment and Plan   1.. Chronic systolic HF/ICM -prior side effects on higerh beta blocker dosing. - avoid ACE/ARB/ARNI/aldactone/SGLT2i given renal function - euvolemic today, continue current meds - will need to recheck echo at f/u, now at 30-35% ICD may need  to be considered. Due to multiple medical comorbidities unclear candidacy.   2. CAD - recent cath as reported, no revasc targets. - continue medical therapy, no recent chest pains.       3.HTN - above goal, increase hydralazine to 25mg  bid  4. Hyperlipidemia - essentially at goal, continue current meds       Antoine Poche, M.D.

## 2022-09-08 NOTE — Patient Instructions (Signed)
It was a pleasure to see you today.  Thank you for giving [name] the opportunity to be involved in your care.  Below is a brief recap of your visit and next steps.  We will plan to see you again in 3 months.  Summary No medication changes today Repeat labs when able Follow up in 3 months

## 2022-09-10 DIAGNOSIS — I63511 Cerebral infarction due to unspecified occlusion or stenosis of right middle cerebral artery: Secondary | ICD-10-CM | POA: Diagnosis not present

## 2022-09-10 DIAGNOSIS — I5042 Chronic combined systolic (congestive) and diastolic (congestive) heart failure: Secondary | ICD-10-CM | POA: Diagnosis not present

## 2022-09-10 DIAGNOSIS — E1059 Type 1 diabetes mellitus with other circulatory complications: Secondary | ICD-10-CM | POA: Diagnosis not present

## 2022-09-13 DIAGNOSIS — R7989 Other specified abnormal findings of blood chemistry: Secondary | ICD-10-CM | POA: Insufficient documentation

## 2022-09-13 NOTE — Assessment & Plan Note (Signed)
TSH 17.6 on labs from December.  She is asymptomatic currently. -Repeat TSH/free T4 ordered today

## 2022-09-13 NOTE — Assessment & Plan Note (Signed)
Incidentally noted RLL nodule during recent admission.  Dedicated CT chest performed last month, revealing well-circumscribed 10 x 7 mm RLL nodule.  This has been present on prior imaging since 2010, but is gradually increasing in size.  Benign/nonaggressive etiology favored, however repeat CT recommended for 3-6 months. -Repeat CT chest at next follow-up appointment

## 2022-09-13 NOTE — Assessment & Plan Note (Signed)
Symptoms have improved with increasing trazodone to 100 mg nightly. -No medication changes today

## 2022-09-13 NOTE — Assessment & Plan Note (Signed)
She is currently prescribed metoprolol succinate 100 mg daily, isosorbide mononitrate 40 mg twice daily, hydralazine 12.5 mg twice daily, and Lasix 40 mg daily for treatment of hypertension.  She reports hydralazine was increased to 25 mg twice daily by cardiology earlier today.  BP in our office today is 119/76. -No medication changes today

## 2022-09-13 NOTE — Assessment & Plan Note (Signed)
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  She has completed antibiotics (Augmentin/doxycycline).  And is followed by podiatry.  Last seen by podiatry on 5/1.  Sutures removed.  Will wound care measures reviewed.  She will return to care for follow-up on 5/23.

## 2022-09-16 ENCOUNTER — Ambulatory Visit (INDEPENDENT_AMBULATORY_CARE_PROVIDER_SITE_OTHER): Payer: BC Managed Care – PPO | Admitting: *Deleted

## 2022-09-16 ENCOUNTER — Encounter: Payer: BC Managed Care – PPO | Admitting: Podiatry

## 2022-09-16 DIAGNOSIS — I639 Cerebral infarction, unspecified: Secondary | ICD-10-CM | POA: Diagnosis not present

## 2022-09-16 DIAGNOSIS — Z5181 Encounter for therapeutic drug level monitoring: Secondary | ICD-10-CM

## 2022-09-16 LAB — POCT INR: POC INR: 2

## 2022-09-16 NOTE — Patient Instructions (Addendum)
Description   Continue taking 1 tablet daily except 1 1/2 tablets on Saturdays.  Recheck in 2 wks

## 2022-09-17 ENCOUNTER — Ambulatory Visit: Payer: BC Managed Care – PPO | Admitting: Physical Medicine & Rehabilitation

## 2022-09-20 ENCOUNTER — Other Ambulatory Visit (HOSPITAL_BASED_OUTPATIENT_CLINIC_OR_DEPARTMENT_OTHER): Payer: Self-pay

## 2022-09-20 ENCOUNTER — Encounter: Payer: Self-pay | Admitting: Internal Medicine

## 2022-09-20 ENCOUNTER — Ambulatory Visit (HOSPITAL_COMMUNITY)
Admission: RE | Admit: 2022-09-20 | Discharge: 2022-09-20 | Disposition: A | Discharge status: Home / Self Care | Payer: BC Managed Care – PPO | Source: Ambulatory Visit | Attending: Internal Medicine | Admitting: Internal Medicine

## 2022-09-20 ENCOUNTER — Other Ambulatory Visit (HOSPITAL_COMMUNITY): Payer: Self-pay

## 2022-09-20 ENCOUNTER — Ambulatory Visit (INDEPENDENT_AMBULATORY_CARE_PROVIDER_SITE_OTHER): Payer: BC Managed Care – PPO | Admitting: Internal Medicine

## 2022-09-20 ENCOUNTER — Encounter: Payer: Self-pay | Admitting: Cardiology

## 2022-09-20 VITALS — BP 125/80 | HR 73 | Ht 65.0 in | Wt 225.0 lb

## 2022-09-20 DIAGNOSIS — R059 Cough, unspecified: Secondary | ICD-10-CM | POA: Diagnosis not present

## 2022-09-20 DIAGNOSIS — R058 Other specified cough: Secondary | ICD-10-CM | POA: Diagnosis not present

## 2022-09-20 NOTE — Patient Instructions (Signed)
Thank you, Lauren [name] for allowing [name] to provide your care today.   We will test for common respiratory viruses today Go to [facility] for chest xray I will follow up with results and further recommendations Continue symptom management with over the counter medications.   Thurmon Fair, M.D.

## 2022-09-20 NOTE — Progress Notes (Signed)
   HPI:[name] is a 42 y.o. female who presents for evaluation of Cough (Coughing, chest congestion, and yellow/green sputum since 09/18/22).  Her 27 year old son started having similar symptoms last week.  He was treated with azithromycin and told he possibly had allergies or infection. Patient also feeling increased fatigue. She has not checked her temperature at home. No increased shortness of breath. Living with HF and endorses chronic orthopnea. No rhinorrhea or sinus congestion.   Physical Exam: Vitals:   09/20/22 1550  BP: 125/80  Pulse: 73  SpO2: 96%  Weight: 225 lb (102.1 kg)  Height: 5\' 5"  (1.651 m)     Physical Exam Constitutional:      General: She is not in acute distress.    Comments: Appears tired, sitting in wheelchair. Companied by friend.   Cardiovascular:     Rate and Rhythm: Normal rate and regular rhythm.     Heart sounds: No murmur heard. Pulmonary:     Effort: Pulmonary effort is normal.     Breath sounds: Rales (left base) present. No wheezing or rhonchi.  Musculoskeletal:     Right lower leg: Edema present.     Left lower leg: Edema present.     Comments: Trace edema in LE      Assessment & Plan:   Lauren [name] was seen today for cough.  Productive cough Assessment & Plan: Patient presents with productive cough for 2 days.  She has has a sick contact will check for respiratory viruses.  Will send for chest xray for further evaluation of pneumonia and alternative diagnosis.   Orders: -     DG Chest 2 View -     COVID-19, Flu A+B and RSV      Milus Banister, MD

## 2022-09-20 NOTE — Assessment & Plan Note (Signed)
Patient presents with productive cough for 2 days.  She has has a sick contact will check for respiratory viruses.  Will send for chest xray for further evaluation of pneumonia and alternative diagnosis.

## 2022-09-21 ENCOUNTER — Encounter: Payer: Self-pay | Admitting: Physical Medicine & Rehabilitation

## 2022-09-21 ENCOUNTER — Encounter: Payer: BC Managed Care – PPO | Attending: Registered Nurse | Admitting: Physical Medicine & Rehabilitation

## 2022-09-21 ENCOUNTER — Telehealth: Payer: Self-pay

## 2022-09-21 VITALS — BP 117/80 | HR 71 | Ht 65.0 in | Wt 225.0 lb

## 2022-09-21 DIAGNOSIS — Z1152 Encounter for screening for COVID-19: Secondary | ICD-10-CM | POA: Diagnosis not present

## 2022-09-21 DIAGNOSIS — S43002S Unspecified subluxation of left shoulder joint, sequela: Secondary | ICD-10-CM | POA: Insufficient documentation

## 2022-09-21 DIAGNOSIS — R6889 Other general symptoms and signs: Secondary | ICD-10-CM | POA: Diagnosis not present

## 2022-09-21 DIAGNOSIS — I639 Cerebral infarction, unspecified: Secondary | ICD-10-CM | POA: Diagnosis not present

## 2022-09-21 MED ORDER — HYDRALAZINE HCL 25 MG PO TABS: 25.0000 mg | ORAL_TABLET | Freq: Two times a day (BID) | ORAL | 3 refills | Status: DC

## 2022-09-21 MED ORDER — GABAPENTIN 100 MG PO CAPS: 100.0000 mg | ORAL_CAPSULE | Freq: Three times a day (TID) | ORAL | 3 refills | Status: DC

## 2022-09-21 MED ORDER — LIDOCAINE HCL 1 % IJ SOLN
2.0000 mL | Freq: Once | INTRAMUSCULAR | Status: AC
Start: 2022-09-21 — End: 2022-09-21
  Administered 2022-09-21: 2 mL

## 2022-09-21 MED ORDER — GABAPENTIN 300 MG PO CAPS: 300.0000 mg | ORAL_CAPSULE | Freq: Three times a day (TID) | ORAL | 0 refills | Status: DC

## 2022-09-21 MED ORDER — LIDOCAINE HCL (PF) 2 % IJ SOLN
2.0000 mL | Freq: Once | INTRAMUSCULAR | Status: DC
Start: 2022-09-21 — End: 2022-09-21

## 2022-09-21 MED ORDER — TRIAMCINOLONE ACETONIDE 40 MG/ML IJ SUSP
40.0000 mg | Freq: Once | INTRAMUSCULAR | Status: AC
Start: 2022-09-21 — End: 2022-09-21
  Administered 2022-09-21: 40 mg via INTRAMUSCULAR

## 2022-09-21 NOTE — Telephone Encounter (Signed)
Lauren [name]  P [facility] Triage (supporting Antoine Poche, MD)13 hours ago (5:49 PM)    Now that I am taking a while pull twice like you innate at my appointment I have thin out of medication and they will not refill it yet because it's too earthly  New rx sent to pharmacy

## 2022-09-21 NOTE — Progress Notes (Signed)
Subjective:    Patient ID: Lauren [name], female    DOB: June 24, 1980, 42 y.o.   MRN: [id number]  Shoulder Pain     Hospital Summary 10/19   Brief HPI:   Lauren [name] is a 42 y.o. female noted to the emergency room on 05/10/2021 with unsteady gait, left-sided facial droop and left arm weakness with slurred speech.  Code stroke called.  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  She underwent thrombectomy with TICI 3.  Cardiology consulted and recent echo revealed decrease in EF to approximately 30 to 35%.  She was noted to have elevated troponin with unremarkable EKG and was treated with nitroglycerin and heparin infusion.  ID was consulted for input due to history of left foot osteomyelitis as well as podiatry.  She underwent bone biopsy of fourth metatarsal left foot and broad-spectrum antibiotics were adjusted.  She did receive 1 unit PRBCs due to drop in H&H.  Hypercoagulable work-up was negative and she was started on Lovenox per hematology input.  Antibiotics were discontinued as cultures were negative.  Diet was advanced to dysphagia 3, leukocytosis was resolving with improvement in renal status.  PT/OT /ST was working with patient who continued to be limited by weakness as well as cognitive deficits.  CIR was recommended due to functional decline.     Hospital Course: Lauren [name] was admitted to rehab 01/28/2022 for inpatient therapies to consist of PT, ST and OT at least three hours five days a week. Past admission physiatrist, therapy team and rehab RN have worked together to provide customized collaborative inpatient rehab. Diet advanced to regular textures. Seen by podiatry 9/30 and left films obtained. Some dehiscence of incision. New wound care orders placed.  Dr. Lilian Kapur saw the patient on 10/3 and allowed the patient to wear post-op shoe or CAM boot to WBAT.    Rapid response called at 1147 on 10/4 due to chest pain.  NTG given and EKG times 2 performed. No evidence of STE. Chest xray c/w volume overload and mild pulmonary edema. Cardiology resident consulted. Lasix 40 mg PO given. Creatinine bump to 2.26. Symptoms improved. BP overnight 110s-120s.  Lasix 10 mg PO daily started on 10/5. Also, hemoglobin drift to 7.0 although on recheck was 7.8. Transfused one unit PRBCs. Check FOBT and restart home Protonix. Creatinine slightly lower at 2.12. Fecal occult stool testing negative times 3 samples and follow up CBC showed H/H to be stable on DAPT as well as Lovenox for coagulopathy.  Dose has been  adjusted due to CKD.  She has had issues with left shoulder pain and sling was ordered for use as needed.  Tramadol has also been used on as needed basis for pain management.   Sudden onset of chest pain again on 10/9. NTG times 3 doses given without relief. Rapid response called. Stat EKG, troponin and CK ordered. Chest x-ray ok and trop below her baseline. Trazodone increased to 75 mg at bedtime for sleep. Increased to 100 mg on 10/12. Wanted to try something else for sleep so started on tizanidine 4 mg q HS.  Hemoglobin trending upward and 9.3 on 10/16. Lasix decreased to 20 mg daily on 10/16 due to elevation in serum creatinine. Consulted medicine service on 10/18 for assistance with kidney function, fluid balance and glucose control.  Strict I's and O's were recommended as patient did not appear to to be fluid overloaded.     Blood pressures were  monitored on TID basis and she was hypotensive on 10/2 without complaints. Midday hydralazine held. Cardiology consulted due to low EF and multiple meds. At admission, she is receiving hydralazine 12.5 mg TID, isosorbide 20 mg BID, Lopressor 37.5 mg BID but was complicated by multiple episodes of orthostatic hypotension with presyncope and cards was consulted multiple times to help with adjustment of medications. Compression with ACE wrap LE and TED hose RLE when OOB also used for BP  support. Dr. Mendel Corning felt that there was nothing further to add for patient's symptoms and recommended home with hospice. As BP stabilized, Lasix 40 mg was started for vascular congestion seen on CXR 10/5. Lopressor increase back to 37.5 mg BID.   Diabetes has been monitored with ac/hs CBG checks and SSI was use prn for tighter BS control. Levemir 25 units continued and Novolog 2 units TID with meals. Diabetic coordinator consulted for assistance with diet. Increased meal coverage to 5 units and added nighttime SSI.  Multiple changes in insulin management were made throughout her stay.  Her blood sugars continue to be poorly controlled as patient was not willing to comply with dietary restrictions during her stay.  Internal medicine recommended that patient's insulin pump be resumed as patient reported that this worked better for her.   Diabetic coordinator was consulted who reviewed endocrinology notes and pump was resumed per home regimen.  Both patient and family have been instructed on importance of diabetic management as well as dietary restrictions to provide fluid overload as well as better blood sugar control.  They are by her of need to contact endocrinology for further adjustment in her insulin protocol.  Patient has made progress during his stay but continues to be limited by left inattention, decrease in attention, problems with recall as well as overall safety awareness.  She will continue to receive further follow-up home health PT, OT, OT and RN by [facility] home health after discharge.   .   Rehab course: During patient's stay in rehab weekly team conferences were held to monitor patient's progress, set goals and discuss barriers to discharge. At admission, patient required X assist with basic self-care skills, moderate to total assist for mobility.   She has had improvement in activity tolerance, balance, postural control as well as ability to compensate for deficits. She has had improvement in  functional use LUE  and LLE as well as improvement in awareness.  She is able to complete ADL tasks with min assist.  She requires supervision for transfers and min assist to ambulate 30'with RW and left hand splint.  She requires min to mod assist with verbal and visual cues to complete functional and familiar tasks safely.  She is able to utilize compensatory swallow strategies at modified independent level.  Patient's safety is also intermittently impacted by mobility.  Family has been instructed on 24 hours supervision for safety. Family education has been completed regarding all aspects of care.        Interval history 04/19/2022 Lauren [name] reports that she has been doing well overall since her discharge from CIR.  She has completed her home therapy.  She is walking much better  She sometimes needs a little assistance with dressing however is able to do it mostly on her own.  She reports she was seen by her PCP for follow-up.  She is also following with hematology.  She feels like her strength has improved significantly since her discharge.  She continues to have shoulder pain and uses  tramadol ordered by her PCP about once a day.  She reports her left leg is healing well and she is following with her surgeon for this.  She denies any issues with bowel or bladder control.   Interval history 05/31/22 Lauren [name] is here for follow-up after her CVA.  She reports she continues to have pain in her left foot due to a foot ulcer.  She is following with wound care and Dr. Lilian Kapur podiatry for this issue.  She is also having severe pain in her left shoulder that is worsened with movement.  She reports that Dr. Estevan Oaks from neurology was considering her to be referred to PT for evaluation for possible vagal nerve stimulation treatment.  She continues to have spasticity in her left hand and wrist.  She is using oxycodone 5 mg as needed only about once a day for her shoulder and foot pain.     Interval history  05/31/22 Patient presented for follow-up and shoulder cortisone injection.  Injection was rescheduled to later visit as patient reports she has a diabetic ulcer with significant MRSA infection and she is waiting on antibiotics to be started.  She continues to have severe pain in her left shoulder and left foot pain.  She was recently prescribed oxycodone 5 mg.  This does help her pain however causes too much sedation during the day so she only takes it at night.  We also discussed consideration of vivistim device.  She reports poor sleep and feels like trazodone was helpful in the hospital for this issue.   Interval history 08/20/22 Lauren [name] is here for follow-up after her MCA CVA.  On 07/21/2022 she had a left foot fifth metatarsal resection.  She continues to follow with podiatry and reports being on antibiotics currently.  Lauren [name] has continued pain in her left shoulder and arm.  She also has worsening swelling in her left upper extremity.  Additionally she continues to have a lot of pain in her left foot since the surgery.  She has been taking oxycodone mostly at night at a dose of 5 mg.  She says this is that when the pain is the worst.  This takes the edge off the pain however does not completely relieve it.  We discussed vagal nerve stimulation and she would be interested in an evaluation for this.  Interval history 09/21/2022 Lauren [name] is here today for a left shoulder injection.  Patient had Vivistim trial completed and qualifies based on OT evaluation.  She is interested in proceeding with neurosurgical evaluation for this.  She continues to have swelling in her left upper extremity.  She has not yet tried compression sleeve because theone she purchased was too tight.  Ultrasound study was negative for DVT in her left upper extremity.  She continues to have a lot of pain in her shoulder and distal left upper extremity mostly at night.  She did not want to try duloxetine ordered last visit because  she was concerned about all the side effects that are possible.  She also had family members that did not tolerate duloxetine.  She continues to use oxycodone 5 mg at night on occasion, she feels like this is not strong enough to keep her pain controlled.    Pain Inventory Average Pain 7 Pain Right Now 6 My pain is aching  In the last 24 hours, has pain interfered with the following? General activity 2 Relation with others 0 Enjoyment of life 10 What TIME of  day is your pain at its worst? night Sleep (in general) Poor  Pain is worse with: some activites Pain improves with: medication Relief from Meds: 5  Family History  Problem Relation Age of Onset   Thyroid disease Mother    Hypertension Mother    Hyperlipidemia Mother    CAD Mother    CVA Mother    Hyperlipidemia Father    Hypertension Father    CAD Father    Breast cancer Paternal Grandmother 66   Cancer Paternal Grandmother        lung   Social History   Socioeconomic History   Marital status: Married    Spouse name: Harvel Quale   Number of children: 2   Years of education: 12   Highest education level: 12th grade  Occupational History    Comment: Full time   Occupation: CNA at [employer]    Comment: applying for disability  Tobacco Use   Smoking status: Former    Packs/day: 1.00    Years: 15.00    Additional pack years: 0.00    Total pack years: 15.00    Types: Cigarettes    Quit date: 01/2022    Years since quitting: 0.7    Passive exposure: Past   Smokeless tobacco: Never  Vaping Use   Vaping Use: Never used  Substance and Sexual Activity   Alcohol use: Yes    Comment: occ   Drug use: No   Sexual activity: Yes    Birth control/protection: Surgical    Comment: tubal   Other Topics Concern   Not on file  Social History Narrative   Lives with husband and kids   Right handed   Drinks 9+ cups caffeine daily   Social Determinants of Health   Financial Resource Strain: Low Risk  (03/03/2022)    Overall Financial Resource Strain (CARDIA)    Difficulty of Paying Living Expenses: Not very hard  Recent Concern: Financial Resource Strain - High Risk (01/14/2022)   Overall Financial Resource Strain (CARDIA)    Difficulty of Paying Living Expenses: Hard  Food Insecurity: No Food Insecurity (08/06/2022)   Hunger Vital Sign    Worried About Running Out of Food in the Last Year: Never true    Ran Out of Food in the Last Year: Never true  Transportation Needs: No Transportation Needs (08/03/2022)   PRAPARE - Administrator, Civil Service (Medical): No    Lack of Transportation (Non-Medical): No  Physical Activity: Inactive (03/03/2022)   Exercise Vital Sign    Days of Exercise per Week: 0 days    Minutes of Exercise per Session: 0 min  Stress: No Stress Concern Present (03/03/2022)   [employer] of Occupational Health - Occupational Stress Questionnaire    Feeling of Stress : Only a little  Recent Concern: Stress - Stress Concern Present (12/22/2021)   [employer] of Occupational Health - Occupational Stress Questionnaire    Feeling of Stress : To some extent  Social Connections: Socially Integrated (03/03/2022)   Social Connection and Isolation Panel [NHANES]    Frequency of Communication with Friends and Family: More than three times a week    Frequency of Social Gatherings with Friends and Family: More than three times a week    Attends Religious Services: More than 4 times per year    Active Member of [employer] or Organizations: Yes    Attends Banker Meetings: More than 4 times per year    Marital Status:  Married   Past Surgical History:  Procedure Laterality Date   ABDOMINAL AORTOGRAM W/LOWER EXTREMITY N/A 06/10/2022   Procedure: ABDOMINAL AORTOGRAM W/LOWER EXTREMITY;  Surgeon: Cephus Shelling, MD;  Location: [facility];  Service: Cardiovascular;  Laterality: N/A;   AMPUTATION Left 09/02/2021   Procedure: AMPUTATION RAY;  Surgeon: Edwin Cap, DPM;  Location: [facility] OR;  Service: Podiatry;  Laterality: Left;  sagittal saw, 3L bag saline & Pulse   Cardiac catherization     CHOLECYSTECTOMY     I & D EXTREMITY Left 08/30/2021   Procedure: IRRIGATION AND DEBRIDEMENT WITH BONE BIOPSY;  Surgeon: Candelaria Stagers, DPM;  Location: [facility] OR;  Service: Podiatry;  Laterality: Left;   IR ANGIO VERTEBRAL SEL SUBCLAVIAN INNOMINATE UNI L MOD SED  01/18/2022   IR CT HEAD LTD  01/08/2022   IR INTRA CRAN STENT  01/08/2022   IR PERCUTANEOUS ART THROMBECTOMY/INFUSION INTRACRANIAL INC DIAG ANGIO  01/08/2022   IRRIGATION AND DEBRIDEMENT FOOT Left 09/04/2021   Procedure: IRRIGATION AND DEBRIDEMENT FOOT AND CLOSURE;  Surgeon: Edwin Cap, DPM;  Location: [facility] OR;  Service: Podiatry;  Laterality: Left;   LEFT HEART CATH AND CORONARY ANGIOGRAPHY N/A 07/26/2022   Procedure: LEFT HEART CATH AND CORONARY ANGIOGRAPHY;  Surgeon: Swaziland, Peter M, MD;  Location: [facility] INVASIVE CV LAB;  Service: Cardiovascular;  Laterality: N/A;   METATARSAL OSTEOTOMY Left 07/21/2022   Procedure: LEFT FOOT EXCISION OF FIFTH METATARSAL WITH DEBRIDEMENT OF ULCER, POSSIBLE BONE BIOPSY OF THE FOURTH METATARSAL, POSSIBLE FOURTH RAY AMPUTATION OF LEFT TOEAND METATARSAL;  Surgeon: Vivi Barrack, DPM;  Location: [facility] OR;  Service: Podiatry;  Laterality: Left;   PERIPHERAL VASCULAR BALLOON ANGIOPLASTY Left 06/10/2022   Procedure: PERIPHERAL VASCULAR BALLOON ANGIOPLASTY;  Surgeon: Cephus Shelling, MD;  Location: [facility];  Service: Cardiovascular;  Laterality: Left;  AT and DP   PERIPHERAL VASCULAR INTERVENTION Left 06/10/2022   Procedure: PERIPHERAL VASCULAR INTERVENTION;  Surgeon: Cephus Shelling, MD;  Location: [facility] INV[facility] CV LAB;  Service: Cardiovascular;  Laterality: Left;  SFA   RADIOLOGY WITH ANESTHESIA N/A 01/08/2022   Procedure: IR WITH ANESTHESIA;  Surgeon: Radiologist, Medication, MD;  Location: [facility];  Service: Radiology;  Laterality: N/A;   THROMBECTOMY BRACHIAL ARTERY Right  11/18/2021   Procedure: RIGHT BRACHIAL, RADIAL, & ULNAR ARTERY THROMBECTOMY.;  Surgeon: Cephus Shelling, MD;  Location: [facility];  Service: Vascular;  Laterality: Right;   TUBAL LIGATION     Past Surgical History:  Procedure Laterality Date   ABDOMINAL AORTOGRAM W/LOWER EXTREMITY N/A 06/10/2022   Procedure: ABDOMINAL AORTOGRAM W/LOWER EXTREMITY;  Surgeon: Cephus Shelling, MD;  Location: [facility];  Service: Cardiovascular;  Laterality: N/A;   AMPUTATION Left 09/02/2021   Procedure: AMPUTATION RAY;  Surgeon: Edwin Cap, DPM;  Location: [facility];  Service: Podiatry;  Laterality: Left;  sagittal saw, 3L bag saline & Pulse   Cardiac catherization     CHOLECYSTECTOMY     I & D EXTREMITY Left 08/30/2021   Procedure: IRRIGATION AND DEBRIDEMENT WITH BONE BIOPSY;  Surgeon: Candelaria Stagers, DPM;  Location: [facility];  Service: Podiatry;  Laterality: Left;   IR ANGIO VERTEBRAL SEL SUBCLAVIAN INNOMINATE UNI L MOD SED  01/18/2022   IR CT HEAD LTD  01/08/2022   IR INTRA CRAN STENT  01/08/2022   IR PERCUTANEOUS ART THROMBECTOMY/INFUSION INTRACRANIAL INC DIAG ANGIO  01/08/2022   IRRIGATION AND DEBRIDEMENT FOOT Left 09/04/2021   Procedure: IRRIGATION AND DEBRIDEMENT FOOT AND  CLOSURE;  Surgeon: Edwin Cap, DPM;  Location: [facility] OR;  Service: Podiatry;  Laterality: Left;   LEFT HEART CATH AND CORONARY ANGIOGRAPHY N/A 07/26/2022   Procedure: LEFT HEART CATH AND CORONARY ANGIOGRAPHY;  Surgeon: Swaziland, Peter M, MD;  Location: [facility] INVASIVE CV LAB;  Service: Cardiovascular;  Laterality: N/A;   METATARSAL OSTEOTOMY Left 07/21/2022   Procedure: LEFT FOOT EXCISION OF FIFTH METATARSAL WITH DEBRIDEMENT OF ULCER, POSSIBLE BONE BIOPSY OF THE FOURTH METATARSAL, POSSIBLE FOURTH RAY AMPUTATION OF LEFT TOEAND METATARSAL;  Surgeon: Vivi Barrack, DPM;  Location: [facility];  Service: Podiatry;  Laterality: Left;   PERIPHERAL VASCULAR BALLOON ANGIOPLASTY Left 06/10/2022   Procedure: PERIPHERAL VASCULAR BALLOON ANGIOPLASTY;   Surgeon: Cephus Shelling, MD;  Location: [facility];  Service: Cardiovascular;  Laterality: Left;  AT and DP   PERIPHERAL VASCULAR INTERVENTION Left 06/10/2022   Procedure: PERIPHERAL VASCULAR INTERVENTION;  Surgeon: Cephus Shelling, MD;  Location: [facility] INVASIVE CV LAB;  Service: Cardiovascular;  Laterality: Left;  SFA   RADIOLOGY WITH ANESTHESIA N/A 01/08/2022   Procedure: IR WITH ANESTHESIA;  Surgeon: Radiologist, Medication, MD;  Location: [facility];  Service: Radiology;  Laterality: N/A;   THROMBECTOMY BRACHIAL ARTERY Right 11/18/2021   Procedure: RIGHT BRACHIAL, RADIAL, & ULNAR ARTERY THROMBECTOMY.;  Surgeon: Cephus Shelling, MD;  Location: [facility] OR;  Service: Vascular;  Laterality: Right;   TUBAL LIGATION     Past Medical History:  Diagnosis Date   Anemia    CAD (coronary artery disease)    a. s/p cath in 03/2014 showing 30% mid-LAD, moderate to severe disease along small D1, patent LCx, moderate to severe distal OM2 stenosis and moderate diffuse diease along RCA not amenable to PCI   CHF (congestive heart failure) (HCC)    a. EF 55-60% in 12/2019 b. EF at 35-40% by echo in 05/2020   CKD (chronic kidney disease) stage 4, GFR 15-29 ml/min (HCC)    Diabetes mellitus without complication (HCC)    Myocardial infarction (HCC)    Neuropathy    PVD (peripheral vascular disease) (HCC)    Stroke (HCC) 01/2022   L hand weakness   BP 117/80   Pulse 71   Ht 5\' 5"  (1.651 m)   Wt 102.1 kg   SpO2 97%   BMI 37.44 kg/m   Opioid Risk Score:   Fall Risk Score:  `1  Depression screen [facility] 2/9     09/21/2022   10:43 AM 09/20/2022    3:51 PM 09/08/2022    3:05 PM 08/20/2022    9:45 AM 08/11/2022    2:27 PM 07/28/2022    3:10 PM 06/01/2022   10:43 AM  Depression screen PHQ 2/9  Decreased Interest 0 0 0 0 1 0 0  Down, Depressed, Hopeless 0 2 1 0 3 1 1   PHQ - 2 Score 0 2 1 0 4 1 1   Altered sleeping  3 3  3  0 2  Tired, decreased energy  0 3  3 0 0  Change in appetite  0 0  0 0 0   Feeling bad or failure about yourself   0 0  3 0 0  Trouble concentrating  0 0  3 1 0  Moving slowly or fidgety/restless  0 0  0 0 0  Suicidal thoughts  0 0  0 0 0  PHQ-9 Score  5 7  16 2 3   Difficult doing work/chores     Very difficult  Review of Systems  Musculoskeletal:  Positive for back pain.       Left shoulder pain Left foot pain   All other systems reviewed and are negative.      Objective:   Physical Exam   Gen: no distress, normal appearing HEENT: oral mucosa pink and moist, NCAT Cardio: Reg rate Chest: normal effort, normal rate of breathing Abd: soft, non-distended Ext: Significant edema noted in LUE particularly the hand Psych: pleasant, normal affect Skin: intact Neuro: Alert and awake, makes eye contact, follows commands, wearing post-op shoe on the left Strength 5 out of 5 right upper extremity and right lower extremity Strength 2 out of 5 left shoulder abduction and elbow extension, elbow flexion 3  out of 5, finger flexion 3-4 out of 5 Strength left lower extremity 4 out of 5 throughout -Sensation intact light touch in all 4 extremities Musculoskeletal: Subluxation left shoulder, pain with shoulder abduction around end range of motion. Increased tone in finger flexors  elbow flexors and pronators left upper extremity            Assessment & Plan:    1. Right MCA infarct status post thrombectomy, right ICA dissection with stent placement. -Neurosurgery consult placed for vivistim eval   2 shoulder subluxation left and shoulder pain/arm pain -Continue Voltaren gel, will schedule for shoulder injection-defer to next visit -Patient reports oxycodone is helping shoulder pain in addition to left foot pain  -UDS , pain agreement last visit -Continue oxycodone 5 mg at night, consider increase to 7.5 mg at next refill if pain does not improve -DC duloxetine per patient request -Will start gabapentin, initially planned 300 mg 3 times daily  although it due to kidney function changed to 100 mg 3 times daily. Shoulder injection    Indication:left Shoulder pain not relieved by medication management and other conservative care.  Informed consent was obtained after describing risks and benefits of the procedure with the patient, this includes bleeding, bruising, infection and medication side effects. The patient wishes to proceed and has given written consent. Patient was placed in a seated position. The left shoulder was marked and prepped with betadine in the subacromial area. A 25-gauge 1-1/2 inch needle was inserted into the subacromial area. After negative draw back for blood, a solution containing 1 mL of Kenalog 40mg   and 2 mL of 1% lidocaine was injected. A band aid was applied. The patient tolerated the procedure well. Post procedure instructions were given.    3 Spasticity left upper extremity.   -Consider Botox if spasticity becomes to limit her function   4. : Left foot osteomyelitis s/p ray amputation  -Continue follow-up with Dr. Lilian Kapur, she reports no longer on antibiotics  5. LUE edema -Advised to get compression sleeve-think she needs a bigger size so she can comfortably wear this -Will order Vascular U/S to r/u DVT-this was negative   10/14/22 called, pt dizziness with Gabapentin, will DC, she is out of oxycodone, will order oxycodone  7.5 mg PRN #30

## 2022-09-23 ENCOUNTER — Ambulatory Visit (INDEPENDENT_AMBULATORY_CARE_PROVIDER_SITE_OTHER): Payer: BC Managed Care – PPO | Admitting: Podiatry

## 2022-09-23 DIAGNOSIS — L97522 Non-pressure chronic ulcer of other part of left foot with fat layer exposed: Secondary | ICD-10-CM | POA: Diagnosis not present

## 2022-09-23 LAB — COVID-19, FLU A+B AND RSV
Influenza A, NAA: NOT DETECTED
Influenza B, NAA: NOT DETECTED
RSV, NAA: NOT DETECTED
SARS-CoV-2, NAA: NOT DETECTED

## 2022-09-27 ENCOUNTER — Encounter: Payer: Self-pay | Admitting: Podiatry

## 2022-09-27 NOTE — Progress Notes (Signed)
  Subjective:  Patient ID: Lauren [name], female    DOB: 1980-08-13,  MRN: [id number]  Chief Complaint  Patient presents with   Routine Post Op    RM [facility] POST OP [facility] S[facility], wound care. Pt states she is doing okay. No concerns. No nv, fever or chills or any sign of infection.    07/21/2022, left foot fifth metatarsal resection  42 y.o. female returns for post-op check.  She has been doing well they are using the collagen  Review of Systems: Negative except as noted in the HPI. Denies N/V/F/Ch.   Objective:  There were no vitals filed for this visit. There is no height or weight on file to calculate BMI. Constitutional Well developed. Well nourished.  Vascular Foot warm and well perfused. Capillary refill normal to all digits.  Calf is soft and supple, no posterior calf or knee pain, negative Homans' sign  Neurologic Normal speech. Oriented to person, place, and time. Epicritic sensation to light touch grossly present bilaterally.  Dermatologic Central area of delayed healing still present with exposed subcutaneous tissue measuring 0.8 x 0.3 x 0.4 cm.  Fibrogranular wound bed.  No signs of infection.  Mild drainage.  Orthopedic: Only mild edema, she has no tenderness to palpation noted about the surgical site.     Assessment:   1. 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  S/p foot surgery left -All sutures removed today.  Does have residual central ulceration.  Postdebridement measurements are noted above.  This was debrided in a full-thickness excisional manner to the subcutaneous layer using a sharp tissue nipper.  It was dressed with Prisma silver collagen dressings and a silicone foam border dressing.  Quite a bit of improvement, continue to utilize this at home.  Return in 3 weeks for follow-up.  Continue daily changes.  May continue regular bathing but do not soak or immerse foot deep  water  Return in about 3 weeks (around 10/14/2022) for wound care.

## 2022-09-29 ENCOUNTER — Other Ambulatory Visit: Payer: Self-pay | Admitting: Internal Medicine

## 2022-09-29 DIAGNOSIS — Z86718 Personal history of other venous thrombosis and embolism: Secondary | ICD-10-CM | POA: Insufficient documentation

## 2022-09-29 DIAGNOSIS — I639 Cerebral infarction, unspecified: Secondary | ICD-10-CM

## 2022-09-29 DIAGNOSIS — N189 Chronic kidney disease, unspecified: Secondary | ICD-10-CM | POA: Insufficient documentation

## 2022-09-30 ENCOUNTER — Encounter: Payer: Self-pay | Admitting: Internal Medicine

## 2022-09-30 ENCOUNTER — Ambulatory Visit (INDEPENDENT_AMBULATORY_CARE_PROVIDER_SITE_OTHER): Payer: BC Managed Care – PPO | Admitting: *Deleted

## 2022-09-30 ENCOUNTER — Encounter: Payer: Self-pay | Admitting: Physical Medicine & Rehabilitation

## 2022-09-30 DIAGNOSIS — Z5181 Encounter for therapeutic drug level monitoring: Secondary | ICD-10-CM

## 2022-09-30 DIAGNOSIS — I639 Cerebral infarction, unspecified: Secondary | ICD-10-CM

## 2022-09-30 LAB — POCT INR: POC INR: 3.4

## 2022-09-30 NOTE — Patient Instructions (Signed)
Description   Hold warfarin today and then continue taking 1 tablet daily except 1 1/2 tablets on Saturdays.  Recheck in 2 wks

## 2022-10-01 ENCOUNTER — Encounter: Payer: Self-pay | Admitting: Neurosurgery

## 2022-10-01 DIAGNOSIS — G8320 Monoplegia of upper limb affecting unspecified side: Secondary | ICD-10-CM | POA: Diagnosis not present

## 2022-10-04 ENCOUNTER — Other Ambulatory Visit: Payer: Self-pay

## 2022-10-04 ENCOUNTER — Telehealth: Payer: Self-pay | Admitting: Cardiology

## 2022-10-04 ENCOUNTER — Encounter: Payer: Self-pay | Admitting: Internal Medicine

## 2022-10-04 MED ORDER — ISOSORBIDE MONONITRATE 20 MG PO TABS: 40.0000 mg | ORAL_TABLET | Freq: Two times a day (BID) | ORAL | 1 refills | Status: DC

## 2022-10-04 NOTE — Telephone Encounter (Signed)
Dr. Wyline Mood,  You saw this patient on 09/08/2022. Will you please comment on medical clearance for vagus nerve stimulator placement under general anesthesia?  Please route your response to [facility] Preop. I will communicate with requesting office once you have given recommendations.   Thank you!  Carlos Levering, NP

## 2022-10-04 NOTE — Telephone Encounter (Signed)
Patient with diagnosis of CVA on warfarin for anticoagulation.    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  Previously on Eliquis but switched to Lovenox.  Later transitioned to warfarin  CrCl 50 Platelet count 567  Will review with Dr. Wyline Mood.  Is this a procedure that can be done while anticoagulated?  (Clearance states hold if applicable).  If hold, would you prefer to bridge?  **This guidance is not considered finalized until pre-operative APP has relayed final recommendations.**

## 2022-10-04 NOTE — Telephone Encounter (Signed)
   Pre-operative Risk Assessment    Patient Name: Lauren [name]  DOB: 06/01/1980 MRN: [id number]      Request for Surgical Clearance    Procedure:   Vagus Nerve Stimulator Placement/General Anesthesia  Date of Surgery:  Clearance TBD                                 Surgeon:  Dr. Lisbeth Renshaw Surgeon's Group or Practice Name:  [facility] Phone number:  [phone] Fax number:  [phone] att: Lowella Bandy   Type of Clearance Requested:   - Medical  - Pharmacy:  Hold If applicable      Type of Anesthesia:  General    Additional requests/questions:    Signed, Seymour Bars   10/04/2022, 10:30 AM

## 2022-10-06 DIAGNOSIS — R531 Weakness: Secondary | ICD-10-CM | POA: Diagnosis not present

## 2022-10-06 NOTE — Telephone Encounter (Signed)
She is on coumadin not by our office, started for CVA and evidence of hypercoagulable state. Clearance would be from her neurologist and hematologist  Dominga Ferry MD

## 2022-10-08 ENCOUNTER — Encounter: Payer: Self-pay | Admitting: *Deleted

## 2022-10-11 ENCOUNTER — Encounter: Payer: Self-pay | Admitting: Internal Medicine

## 2022-10-11 ENCOUNTER — Telehealth: Payer: Self-pay | Admitting: Physical Medicine & Rehabilitation

## 2022-10-11 ENCOUNTER — Ambulatory Visit (HOSPITAL_COMMUNITY)
Admission: RE | Admit: 2022-10-11 | Discharge: 2022-10-11 | Disposition: A | Discharge status: Home / Self Care | Payer: Commercial Managed Care - PPO | Source: Ambulatory Visit | Attending: Internal Medicine | Admitting: Internal Medicine

## 2022-10-11 ENCOUNTER — Ambulatory Visit: Payer: Commercial Managed Care - PPO | Admitting: Internal Medicine

## 2022-10-11 VITALS — BP 136/80 | HR 75 | Ht 65.0 in | Wt 238.6 lb

## 2022-10-11 DIAGNOSIS — S6992XA Unspecified injury of left wrist, hand and finger(s), initial encounter: Secondary | ICD-10-CM | POA: Diagnosis not present

## 2022-10-11 DIAGNOSIS — R0789 Other chest pain: Secondary | ICD-10-CM | POA: Insufficient documentation

## 2022-10-11 DIAGNOSIS — M79645 Pain in left finger(s): Secondary | ICD-10-CM

## 2022-10-11 DIAGNOSIS — R42 Dizziness and giddiness: Secondary | ICD-10-CM

## 2022-10-11 MED ORDER — LIDOCAINE 4 % EX PTCH
1.0000 | MEDICATED_PATCH | CUTANEOUS | 0 refills | Status: DC
Start: 2022-10-11 — End: 2022-10-12

## 2022-10-11 NOTE — Assessment & Plan Note (Addendum)
There is tenderness to palpation over the right chest wall.  No significant ecchymoses is appreciated.  Pain began after recent fall onto her right side. -Chest x-ray ordered today to evaluate for rib fractures -Lidocaine patches prescribed for pain relief

## 2022-10-11 NOTE — Patient Instructions (Signed)
It was a pleasure to see you today.  Thank you for giving [name] the opportunity to be involved in your care.  Below is a brief recap of your visit and next steps.  We will plan to see you again in 4 weeks.  Summary I recommend stopping gabapentin Lidocaine patches ordered for pain relief Xrays of your chest and left thumb have been ordered Follow up in 4 weeks

## 2022-10-11 NOTE — Assessment & Plan Note (Signed)
Today for an acute visit endorsing several week history of intermittent dizziness that has worsened over the last week.  This has led to a recent fall and is often associated with nausea.  The onset of dizziness seems to correlate with starting gabapentin.  Orthostatics are negative in office today.  No nystagmus is appreciated on exam. -I have recommended that she discontinue gabapentin.  She will discuss this with her pain management provider.  Consider further workup if dizziness persists despite discontinuing gabapentin.

## 2022-10-11 NOTE — Telephone Encounter (Signed)
   Patient Name: Lauren [name]  DOB: August 24, 1980 MRN: [id number]  Primary Cardiologist: Dina Rich, MD  Chart reviewed as part of pre-operative protocol coverage. Pre-op clearance already addressed by colleagues in earlier phone notes. To summarize recommendations:  - She is on coumadin not by our office, started for CVA and evidence of hypercoagulable state. Clearance would be from her neurologist and hematologist   Dominga Ferry MD      Will route this bundled recommendation to requesting provider via Epic fax function and remove from pre-op pool. Please call with questions.  Sharlene Dory, PA-C 10/11/2022, 7:18 AM

## 2022-10-11 NOTE — Telephone Encounter (Signed)
Patient wanted to let Dr. Natale Lay know that she fell on 6/7, saw her primary.  She has some broken ribs and thumb.  Her primary doctor prescribed her a lidocaine patient to put on her ribs.  He also suggested that she stop taking gabapentin because it is making her dizzy.  Would like to know what else she can take since Gabapentin makes her dizzy.

## 2022-10-11 NOTE — Assessment & Plan Note (Signed)
Increased swelling and ecchymoses is present over the left thumb.  This was noted after a recent fall in which she attempted to brace herself with her left hand.  ROM is limited as a residual deficit of a previous CVA.  Sensation is also reduced in the left hand, however she notes that her left hand has felt warm recently instead of cold. -Left thumb x-rays ordered today

## 2022-10-11 NOTE — Progress Notes (Signed)
Acute Office Visit  Subjective:     Patient ID: Lauren [name], female    DOB: 1980-06-02, 42 y.o.   MRN: [id number]  Chief Complaint  Patient presents with   Dizziness    Started around 10/05/2022 then fell on 10/09/2022.    Lauren [name] presents today for an acute visit for evaluation of dizziness and recent fall.  Symptoms have been intermittently present since late May and have worsened over the last week.  Dizziness is often associated with nausea, which is alleviated with wearing a scopolamine patch.  It is triggered by sudden changes in position, such as going from sitting to standing, but also occurs at rest and with sudden head movements.  According to her husband, the onset of dizziness seems to correlate with starting gabapentin 100 mg 3 times daily. Lauren [name] has also fallen recently, landing on her right side.  She currently has pain along her right chest wall and is unable to deeply inspire due to pain.  She additionally has bruising and swelling in her left thumb.  Sensation and motion in the left hand are reduced due to a prior history of CVA.  She states that the hand normally feels cold, but more recently has felt warm since her fall.  Review of Systems  Musculoskeletal:        Right chest wall pain, swelling and bruising of the left thumb  Neurological:  Positive for dizziness.      Objective:    BP 136/80   Pulse 75   Ht 5\' 5"  (1.651 m)   Wt 238 lb 9.6 oz (108.2 kg)   SpO2 95%   BMI 39.71 kg/m   Physical Exam Vitals reviewed.  Constitutional:      Comments: Examined in wheelchair  HENT:     Head: Normocephalic and atraumatic.     Right Ear: External ear normal.     Left Ear: External ear normal.     Nose: Nose normal.     Mouth/Throat:     Mouth: Mucous membranes are moist.     Pharynx: Oropharynx is clear. No oropharyngeal exudate or posterior oropharyngeal erythema.  Eyes:     General: No scleral icterus.    Extraocular Movements: Extraocular movements  intact.     Conjunctiva/sclera: Conjunctivae normal.     Pupils: Pupils are equal, round, and reactive to light.  Cardiovascular:     Rate and Rhythm: Normal rate and regular rhythm.     Pulses: Normal pulses.     Heart sounds: Murmur heard.  Pulmonary:     Effort: Pulmonary effort is normal.     Breath sounds: Normal breath sounds. No wheezing, rhonchi or rales.  Abdominal:     General: Abdomen is flat. Bowel sounds are normal.     Palpations: Abdomen is soft.  Musculoskeletal:        General: Tenderness (Right chest wall) present.     Cervical back: Normal range of motion.     Comments: Ecchymoses and increased swelling are present at the left thumb.  Skin:    General: Skin is warm and dry.  Neurological:     Mental Status: She is alert and oriented to person, place, and time.     Cranial Nerves: No cranial nerve deficit.     Motor: Weakness (Left arm and leg.  Weakness is more prominent in the left upper extremity) present.  Psychiatric:        Mood and Affect: Mood normal.  Behavior: Behavior normal.       Assessment & Plan:   Problem List Items Addressed This Visit       Dizziness    Today for an acute visit endorsing several week history of intermittent dizziness that has worsened over the last week.  This has led to a recent fall and is often associated with nausea.  The onset of dizziness seems to correlate with starting gabapentin.  Orthostatics are negative in office today.  No nystagmus is appreciated on exam. -I have recommended that she discontinue gabapentin.  She will discuss this with her pain management provider.  Consider further workup if dizziness persists despite discontinuing gabapentin.      Right-sided chest wall pain - Primary    There is tenderness to palpation over the right chest wall.  No significant ecchymoses is appreciated.  Pain began after recent fall onto her right side. -Chest x-ray ordered today to evaluate for rib  fractures -Lidocaine patches prescribed for pain relief      Injury of left thumb    Increased swelling and ecchymoses is present over the left thumb.  This was noted after a recent fall in which she attempted to brace herself with her left hand.  ROM is limited as a residual deficit of a previous CVA.  Sensation is also reduced in the left hand, however she notes that her left hand has felt warm recently instead of cold. -Left thumb x-rays ordered today      Meds ordered this encounter  Medications   lidocaine (HM LIDOCAINE PATCH) 4 %    Sig: Place 1 patch onto the skin daily.    Dispense:  10 patch    Refill:  0    Return in about 4 weeks (around 11/08/2022).  Billie Lade, MD

## 2022-10-12 ENCOUNTER — Other Ambulatory Visit: Payer: Self-pay

## 2022-10-12 ENCOUNTER — Encounter: Payer: Self-pay | Admitting: Physical Medicine & Rehabilitation

## 2022-10-12 DIAGNOSIS — R0789 Other chest pain: Secondary | ICD-10-CM

## 2022-10-12 LAB — T4, FREE: Free T4: 1.07 ng/dL (ref 0.82–1.77)

## 2022-10-12 LAB — TSH: TSH: 9.82 u[IU]/mL — ABNORMAL HIGH (ref 0.450–4.500)

## 2022-10-12 MED ORDER — LIDOCAINE 4 % EX PTCH
1.0000 | MEDICATED_PATCH | CUTANEOUS | 0 refills | Status: DC
Start: 2022-10-12 — End: 2023-03-05

## 2022-10-13 ENCOUNTER — Encounter: Payer: Self-pay | Admitting: Internal Medicine

## 2022-10-13 ENCOUNTER — Other Ambulatory Visit: Payer: Self-pay | Admitting: Internal Medicine

## 2022-10-13 ENCOUNTER — Other Ambulatory Visit: Payer: Self-pay | Admitting: Physical Medicine & Rehabilitation

## 2022-10-13 DIAGNOSIS — I639 Cerebral infarction, unspecified: Secondary | ICD-10-CM

## 2022-10-14 ENCOUNTER — Other Ambulatory Visit: Payer: Self-pay

## 2022-10-14 ENCOUNTER — Ambulatory Visit (INDEPENDENT_AMBULATORY_CARE_PROVIDER_SITE_OTHER): Payer: Commercial Managed Care - PPO | Admitting: Podiatry

## 2022-10-14 ENCOUNTER — Other Ambulatory Visit (HOSPITAL_COMMUNITY): Payer: Self-pay

## 2022-10-14 ENCOUNTER — Ambulatory Visit: Payer: Commercial Managed Care - PPO

## 2022-10-14 DIAGNOSIS — L97522 Non-pressure chronic ulcer of other part of left foot with fat layer exposed: Secondary | ICD-10-CM

## 2022-10-14 DIAGNOSIS — T148XXD Other injury of unspecified body region, subsequent encounter: Secondary | ICD-10-CM

## 2022-10-14 MED ORDER — OMNIPOD 5 DEXG7G6 PODS GEN 5 MISC
0 refills | Status: DC
  Filled 2022-10-14: qty 10, 30d supply, fill #0

## 2022-10-14 MED ORDER — OXYCODONE HCL 7.5 MG PO TABS: 7.5000 mg | ORAL_TABLET | Freq: Every day | ORAL | 0 refills | Status: DC | PRN

## 2022-10-15 ENCOUNTER — Other Ambulatory Visit: Payer: Self-pay | Admitting: Physical Medicine & Rehabilitation

## 2022-10-15 ENCOUNTER — Telehealth: Payer: Self-pay | Admitting: Physical Medicine & Rehabilitation

## 2022-10-15 ENCOUNTER — Encounter: Payer: Self-pay | Admitting: Physical Medicine & Rehabilitation

## 2022-10-15 MED ORDER — OXYCODONE-ACETAMINOPHEN 7.5-325 MG PO TABS: 1.0000 | ORAL_TABLET | Freq: Every day | ORAL | 0 refills | Status: DC | PRN

## 2022-10-15 NOTE — Telephone Encounter (Signed)
The 7.5 oxycodone isn't available. So she will needed to the 15 mg 1/2 a tablet or the percocet 7.5 325 mg

## 2022-10-17 NOTE — Progress Notes (Signed)
  Subjective:  Patient ID: Lauren [name], female    DOB: Jul 08, 1980,  MRN: [id number]  Chief Complaint  Patient presents with   Diabetic Ulcer    Patient came in today for 07/21/2022, left foot fifth metatarsal resection, follow-up, left foot, patient denies any pain,    07/21/2022, left foot fifth metatarsal resection  42 y.o. female returns for post-op check.  She has been doing well they are using the collagen  Review of Systems: Negative except as noted in the HPI. Denies N/V/F/Ch.   Objective:  There were no vitals filed for this visit. There is no height or weight on file to calculate BMI. Constitutional Well developed. Well nourished.  Vascular Foot warm and well perfused. Capillary refill normal to all digits.  Calf is soft and supple, no posterior calf or knee pain, negative Homans' sign  Neurologic Normal speech. Oriented to person, place, and time. Epicritic sensation to light touch grossly present bilaterally.  Dermatologic Central area of delayed healing still present with exposed subcutaneous tissue measuring 0.6 x 0.3 x 0.3 cm.  Fibrogranular wound bed.  No signs of infection.  Mild drainage.  Orthopedic: Only mild edema, she has no tenderness to palpation noted about the surgical site.     Assessment:   1. 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 Delayed wound healing    Plan:  Patient was evaluated and treated and all questions answered.  S/p foot surgery left -Ulcer evaluated no infection.  The wound was debrided in a full-thickness excisional manner to the subcutaneous layer using a sharp tissue nipper.   Postdebridement measurements are noted above. It was dressed with Prisma silver collagen dressings and a silicone foam border dressing.Continue this at home daily. Starting to have some varus attitude of the foot position, may need an AFO  Return in about 3 weeks (around 11/04/2022) for wound care.

## 2022-10-18 ENCOUNTER — Ambulatory Visit: Payer: Commercial Managed Care - PPO | Admitting: Nurse Practitioner

## 2022-10-18 ENCOUNTER — Encounter: Payer: Self-pay | Admitting: Nurse Practitioner

## 2022-10-18 ENCOUNTER — Other Ambulatory Visit: Payer: Self-pay | Admitting: Internal Medicine

## 2022-10-18 VITALS — BP 136/89 | HR 82 | Ht 65.0 in | Wt 238.6 lb

## 2022-10-18 DIAGNOSIS — E782 Mixed hyperlipidemia: Secondary | ICD-10-CM | POA: Diagnosis not present

## 2022-10-18 DIAGNOSIS — E1065 Type 1 diabetes mellitus with hyperglycemia: Secondary | ICD-10-CM

## 2022-10-18 DIAGNOSIS — E039 Hypothyroidism, unspecified: Secondary | ICD-10-CM

## 2022-10-18 DIAGNOSIS — I10 Essential (primary) hypertension: Secondary | ICD-10-CM

## 2022-10-18 DIAGNOSIS — Z794 Long term (current) use of insulin: Secondary | ICD-10-CM

## 2022-10-18 DIAGNOSIS — S62512A Displaced fracture of proximal phalanx of left thumb, initial encounter for closed fracture: Secondary | ICD-10-CM

## 2022-10-18 DIAGNOSIS — S62522A Displaced fracture of distal phalanx of left thumb, initial encounter for closed fracture: Secondary | ICD-10-CM

## 2022-10-18 LAB — POCT GLYCOSYLATED HEMOGLOBIN (HGB A1C): Hemoglobin A1C: 8.3 % — AB (ref 4.0–5.6)

## 2022-10-18 MED ORDER — INSULIN ASPART 100 UNIT/ML IJ SOLN: INTRAMUSCULAR | 3 refills | Status: DC

## 2022-10-18 MED ORDER — OMNIPOD 5 DEXG7G6 PODS GEN 5 MISC: 3 refills | Status: DC

## 2022-10-18 NOTE — Patient Instructions (Signed)

## 2022-10-18 NOTE — Progress Notes (Signed)
Endocrinology Follow Up Note       10/18/2022, 9:47 AM   Subjective:    Patient ID: Lauren [name], female    DOB: 06-Nov-1980.  Lauren [name] is being seen in follow up after being seen in consultation for management of currently uncontrolled symptomatic diabetes requested by  Lauren Lade, MD.   Past Medical History:  Diagnosis Date   Anemia    CAD (coronary artery disease)    a. s/p cath in 03/2014 showing 30% mid-LAD, moderate to severe disease along small D1, patent LCx, moderate to severe distal OM2 stenosis and moderate diffuse diease along RCA not amenable to PCI   CHF (congestive heart failure) (HCC)    a. EF 55-60% in 12/2019 b. EF at 35-40% by echo in 05/2020   CKD (chronic kidney disease) stage 4, GFR 15-29 ml/min (HCC)    Diabetes mellitus without complication (HCC)    Myocardial infarction (HCC)    Neuropathy    PVD (peripheral vascular disease) (HCC)    Stroke (HCC) 01/2022   L hand weakness    Past Surgical History:  Procedure Laterality Date   ABDOMINAL AORTOGRAM W/LOWER EXTREMITY N/A 06/10/2022   Procedure: ABDOMINAL AORTOGRAM W/LOWER EXTREMITY;  Surgeon: Cephus Shelling, MD;  Location: [facility];  Service: Cardiovascular;  Laterality: N/A;   AMPUTATION Left 09/02/2021   Procedure: AMPUTATION RAY;  Surgeon: Edwin Cap, DPM;  Location: [facility];  Service: Podiatry;  Laterality: Left;  sagittal saw, 3L bag saline & Pulse   Cardiac catherization     CHOLECYSTECTOMY     I & D EXTREMITY Left 08/30/2021   Procedure: IRRIGATION AND DEBRIDEMENT WITH BONE BIOPSY;  Surgeon: Candelaria Stagers, DPM;  Location: [facility];  Service: Podiatry;  Laterality: Left;   IR ANGIO VERTEBRAL SEL SUBCLAVIAN INNOMINATE UNI L MOD SED  01/18/2022   IR CT HEAD LTD  01/08/2022   IR INTRA CRAN STENT  01/08/2022   IR PERCUTANEOUS ART THROMBECTOMY/INFUSION INTRACRANIAL INC DIAG ANGIO  01/08/2022   IRRIGATION AND  DEBRIDEMENT FOOT Left 09/04/2021   Procedure: IRRIGATION AND DEBRIDEMENT FOOT AND CLOSURE;  Surgeon: Edwin Cap, DPM;  Location: [facility];  Service: Podiatry;  Laterality: Left;   LEFT HEART CATH AND CORONARY ANGIOGRAPHY N/A 07/26/2022   Procedure: LEFT HEART CATH AND CORONARY ANGIOGRAPHY;  Surgeon: [place], Peter M, MD;  Location: [facility] INVASIVE CV LAB;  Service: Cardiovascular;  Laterality: N/A;   METATARSAL OSTEOTOMY Left 07/21/2022   Procedure: LEFT FOOT EXCISION OF FIFTH METATARSAL WITH DEBRIDEMENT OF ULCER, POSSIBLE BONE BIOPSY OF THE FOURTH METATARSAL, POSSIBLE FOURTH RAY AMPUTATION OF LEFT TOEAND METATARSAL;  Surgeon: Vivi Barrack, DPM;  Location: [facility];  Service: Podiatry;  Laterality: Left;   PERIPHERAL VASCULAR BALLOON ANGIOPLASTY Left 06/10/2022   Procedure: PERIPHERAL VASCULAR BALLOON ANGIOPLASTY;  Surgeon: Cephus Shelling, MD;  Location: [facility];  Service: Cardiovascular;  Laterality: Left;  AT and DP   PERIPHERAL VASCULAR INTERVENTION Left 06/10/2022   Procedure: PERIPHERAL VASCULAR INTERVENTION;  Surgeon: Cephus Shelling, MD;  Location: [facility] INV[facility] CV [facility];  Service: Cardiovascular;  Laterality: Left;  SFA   RADIOLOGY WITH ANESTHESIA N/A 01/08/2022   Procedure:  IR WITH ANESTHESIA;  Surgeon: Radiologist, Medication, MD;  Location: [facility];  Service: Radiology;  Laterality: N/A;   THROMBECTOMY BRACHIAL ARTERY Right 11/18/2021   Procedure: RIGHT BRACHIAL, RADIAL, & ULNAR ARTERY THROMBECTOMY.;  Surgeon: Cephus Shelling, MD;  Location: [facility];  Service: Vascular;  Laterality: Right;   TUBAL LIGATION      Social History   Socioeconomic History   Marital status: Married    Spouse name: Harvel Quale   Number of children: 2   Years of education: 12   Highest education level: 12th grade  Occupational History    Comment: Full time   Occupation: CNA at [employer]    Comment: applying for disability  Tobacco Use   Smoking status: Former    Packs/day: 1.00    Years: 15.00     Additional pack years: 0.00    Total pack years: 15.00    Types: Cigarettes    Quit date: 01/2022    Years since quitting: 0.7    Passive exposure: Past   Smokeless tobacco: Never  Vaping Use   Vaping Use: Never used  Substance and Sexual Activity   Alcohol use: Yes    Comment: occ   Drug use: No   Sexual activity: Yes    Birth control/protection: Surgical    Comment: tubal   Other Topics Concern   Not on file  Social History Narrative   Lives with husband and kids   Right handed   Drinks 9+ cups caffeine daily   Social Determinants of Health   Financial Resource Strain: Low Risk  (03/03/2022)   Overall Financial Resource Strain (CARDIA)    Difficulty of Paying Living Expenses: Not very hard  Recent Concern: Physicist, medical Strain - High Risk (01/14/2022)   Overall Financial Resource Strain (CARDIA)    Difficulty of Paying Living Expenses: Hard  Food Insecurity: No Food Insecurity (08/06/2022)   Hunger Vital Sign    Worried About Running Out of Food in the Last Year: Never true    Ran Out of Food in the Last Year: Never true  Transportation Needs: No Transportation Needs (08/03/2022)   PRAPARE - Administrator, Civil Service (Medical): No    Lack of Transportation (Non-Medical): No  Physical Activity: Inactive (03/03/2022)   Exercise Vital Sign    Days of Exercise per Week: 0 days    Minutes of Exercise per Session: 0 min  Stress: No Stress Concern Present (03/03/2022)   [facility]ren [name] [employer] - Occupational Stress Questionnaire    Feeling of Stress : Only a little  Recent Concern: Stress - Stress Concern Present (12/22/2021)   Lauren [name] [employer] - Occupational Stress Questionnaire    Feeling of Stress : To some extent  Social Connections: Socially Integrated (03/03/2022)   Social Connection and Isolation Panel [NHANES]    Frequency of Communication with Friends and Family: More than three times a week    Frequency of  Social Gatherings with Friends and Family: More than three times a week    Attends Religious Services: More than 4 times per year    Active Member of [employer] or Organizations: Yes    Attends Engineer, structural: More than 4 times per year    Marital Status: Married    Family History  Problem Relation Age of Onset   Thyroid disease Mother    Hypertension Mother    Hyperlipidemia Mother    CAD Mother    CVA Mother  Hyperlipidemia Father    Hypertension Father    CAD Father    Breast cancer Paternal Grandmother 61   Cancer Paternal Grandmother        lung    Outpatient Encounter Medications as of 10/18/2022  Medication Sig   acetaminophen (TYLENOL) 325 MG tablet Take 1-2 tablets (325-650 mg total) by mouth every 4 (four) hours as needed for mild pain. (Patient taking differently: Take 650 mg by mouth as needed for mild pain.)   atorvastatin (LIPITOR) 40 MG tablet Take 1 tablet (40 mg total) by mouth daily. Courtesy fill/ pt to get established with a primary MD for further refills   clopidogrel (PLAVIX) 75 MG tablet Take 1 tablet (75 mg total) by mouth daily.   Continuous Blood Gluc Sensor (DEXCOM G6 SENSOR) MISC Change sensor every 10 days as directed   diclofenac Sodium (VOLTAREN ARTHRITIS PAIN) 1 % GEL Apply 2 g topically 4 (four) times daily. (Patient taking differently: Apply 1 Application topically 2 (two) times daily as needed (pain).)   furosemide (LASIX) 40 MG tablet Take 40 mg daily as needed for swelling   gabapentin (NEURONTIN) 100 MG capsule Take 1 capsule (100 mg total) by mouth 3 (three) times daily. Start with 1 cap at night for 3 days, then increase to twice a day for 3 days, then take 3 times a day   hydrALAZINE (APRESOLINE) 25 MG tablet Take 1 tablet (25 mg total) by mouth in the morning and at bedtime.   Insulin Disposable Pump (OMNIPOD 5 G6 PODS, GEN 5,) MISC Change pod every 48-72 hours   isosorbide mononitrate (ISMO) 20 MG tablet Take 2 tablets (40 mg  total) by mouth 2 (two) times daily.   levothyroxine (SYNTHROID) 125 MCG tablet Take 1 tablet (125 mcg total) by mouth daily at 6 (six) AM.   lidocaine (HM LIDOCAINE PATCH) 4 % Place 1 patch onto the skin daily.   metoprolol succinate (TOPROL-XL) 100 MG 24 hr tablet Take 1 tablet (100 mg total) by mouth daily. Take with or immediately following a meal.   Multiple Vitamin (MULTIVITAMIN WITH MINERALS) TABS tablet Take 1 tablet by mouth daily.   nitroGLYCERIN (NITROSTAT) 0.4 MG SL tablet Place 1 tablet (0.4 mg total) under the tongue every 5 (five) minutes x 3 doses as needed for chest pain.   oxyCODONE-acetaminophen (PERCOCET) 7.5-325 MG tablet Take 1 tablet by mouth daily as needed for severe pain.   pantoprazole (PROTONIX) 40 MG tablet Take 1 tablet (40 mg total) by mouth daily. Courtesy fill/ pt to get established with a primary MD for further refills (Patient taking differently: Take 40 mg by mouth daily as needed (reflux).)   polyethylene glycol (MIRALAX / GLYCOLAX) 17 g packet Take 17 g by mouth 2 (two) times daily. (Patient taking differently: Take 17 g by mouth as needed for moderate constipation.)   ranolazine (RANEXA) 1000 MG SR tablet Take 1 tablet (1,000 mg total) by mouth 2 (two) times daily.   SANTYL 250 UNIT/GM ointment Apply 1 Application topically daily.   scopolamine (TRANSDERM-SCOP) 1 MG/3DAYS Place 1 patch (1.5 mg total) onto the skin every 3 (three) days.   senna-docusate (SENOKOT-S) 8.6-50 MG tablet Take 1-2 tablets by mouth daily as needed for mild constipation.   tiZANidine (ZANAFLEX) 4 MG tablet TAKE (1) TABLET BY MOUTH AT BEDTIME.   traZODone (DESYREL) 100 MG tablet Take 1 tablet (100 mg total) by mouth at bedtime.   warfarin (COUMADIN) 5 MG tablet TAKE ONE TABLET BY MOUTH  DAILY EXCEPT 1 AND 1/2 TABLET ON WEDNESDAYS AND SATURDAYS OR AS DIRECTED (Patient taking differently: Take 5-7.5 mg by mouth every evening. TAKE ONE TABLET BY MOUTH DAILY EXCEPT 1 AND 1/2 TABLET ON WEDNESDAYS  AND SATURDAYS OR AS DIRECTED)   [DISCONTINUED] insulin aspart (NOVOLOG) 100 UNIT/ML injection Use with Omnipod for daily dose around 80 units daily   [DISCONTINUED] Insulin Disposable Pump (OMNIPOD 5 G6 PODS, GEN 5,) MISC Change pod every 48-72 hours.   insulin aspart (NOVOLOG) 100 UNIT/ML injection Use with Omnipod for daily dose around 100 units daily   Insulin Disposable Pump (OMNIPOD 5 G6 PODS, GEN 5,) MISC Change pod every 48-72 hours.   [DISCONTINUED] Insulin Disposable Pump (OMNIPOD 5 G6 PODS, GEN 5,) MISC Change pod every 48-72 hours   [DISCONTINUED] oxyCODONE (OXY IR/ROXICODONE) 5 MG immediate release tablet Take 1 tablet (5 mg total) by mouth daily as needed for severe pain.   No facility-administered encounter medications on file as of 10/18/2022.    ALLERGIES: Allergies  Allergen Reactions   Visipaque [Iodixanol] Nausea And Vomiting    Patient had vagal response during procedure became hypertensive, and bradycardic. CO2 used during procedure prior to contrast being used, this may be cause as well. Recommended premedicating prior to contrast being used in the future.    Wellbutrin [Bupropion] Hives   Gabapentin     dizzy   Cefepime Rash    Tolerates penicilllin   Ciprofloxacin Hcl Hives and Rash    Hives/rash at injection site; 01/15/22 tolerated IV cipro   Tape Rash    VACCINATION STATUS: Immunization History  Administered Date(s) Administered   Influenza-Unspecified 05/01/2021   Moderna Sars-Covid-2 Vaccination 09/23/2019, 10/28/2019    Diabetes She presents for her follow-up diabetic visit. She has type 1 diabetes mellitus. Onset time: diagnosed at age of 89. Her disease course has been improving. There are no hypoglycemic associated symptoms. Pertinent negatives for hypoglycemia include no nervousness/anxiousness. Associated symptoms include blurred vision, fatigue and foot paresthesias. Pertinent negatives for diabetes include no polydipsia, no polyuria and no weight  loss. There are no hypoglycemic complications. Symptoms are resolved. Diabetic complications include a CVA, heart disease, nephropathy and PVD. Risk factors for coronary artery disease include diabetes mellitus, dyslipidemia, family history, obesity, hypertension and tobacco exposure. Current diabetic treatment includes insulin pump. She is compliant with treatment most of the time. Her weight is fluctuating minimally. She is following a generally unhealthy diet. When asked about meal planning, she reported none. She has not had a previous visit with a dietitian. She rarely participates in exercise. Her home blood glucose trend is decreasing steadily. Her overall blood glucose range is >200 mg/dl. (She presents today, accompanied by her husband, with her CGM and Omnipod combo showing at goal fasting and slightly above target postprandial readings.  Her POCT A1c today is 8.3%, increasing from last visit of 7%.  Analysis of her CGM shows TIR 56%, TAR 44%, TBR 0% with a GMI of 7.9%.  She admits to eating processed foods and sweets.  She recently fell and bruised some ribs and left thumb.  Says he left foot is healing well, still in ortho boot.) An ACE inhibitor/angiotensin II receptor blocker is not being taken. She does not see a podiatrist.Eye exam is current.  Hypertension This is a chronic problem. The current episode started more than 1 year ago. The problem is unchanged. The problem is controlled. Associated symptoms include blurred vision. Agents associated with hypertension include thyroid hormones. Risk factors for coronary artery  disease include diabetes mellitus, dyslipidemia, family history and smoking/tobacco exposure. Past treatments include beta blockers, diuretics and direct vasodilators. The current treatment provides moderate improvement. Compliance problems include diet and exercise.  Hypertensive end-organ damage includes kidney disease, CAD/MI, CVA, heart failure and PVD. Identifiable causes of  hypertension include chronic renal disease and a thyroid problem.  Hyperlipidemia This is a chronic problem. The current episode started more than 1 year ago. The problem is controlled. Recent lipid tests were reviewed and are normal. Exacerbating diseases include chronic renal disease, diabetes, hypothyroidism and obesity. Factors aggravating her hyperlipidemia include beta blockers and fatty foods. Current antihyperlipidemic treatment includes statins and fibric acid derivatives. The current treatment provides moderate improvement of lipids. Compliance problems include adherence to diet and adherence to exercise.  Risk factors for coronary artery disease include diabetes mellitus, dyslipidemia, family history, obesity and hypertension.  Thyroid Problem Presents for initial visit. Symptoms include fatigue. Patient reports no anxiety or weight loss. The symptoms have been stable. Past treatments include levothyroxine. Her past medical history is significant for diabetes, heart failure, hyperlipidemia and obesity. Risk factors include family history of hypothyroidism.     Review of systems  Constitutional: + steadily increasing body weight, current Body mass index is 39.71 kg/m., + fatigue, no subjective hyperthermia, no subjective hypothermia Eyes: + blurry vision, no xerophthalmia ENT: no sore throat, no nodules palpated in throat, no dysphagia/odynophagia, no hoarseness Cardiovascular: no chest pain, no shortness of breath, no palpitations, + leg swelling, left hand swelling Respiratory: no cough, no shortness of breath Gastrointestinal: no nausea/vomiting/diarrhea Musculoskeletal: no muscle/joint aches, left foot in ortho boot Skin: no rashes, no hyperemia Neurological: no tremors, no numbness, no tingling, no dizziness Psychiatric: no depression, no anxiety  Objective:     BP 136/89 (BP Location: Right Arm, Patient Position: Sitting, Cuff Size: Large)   Pulse 82   Ht 5\' 5"  (1.651 m)    Wt 238 lb 9.6 oz (108.2 kg)   BMI 39.71 kg/m   Wt Readings from Last 3 Encounters:  10/18/22 238 lb 9.6 oz (108.2 kg)  10/11/22 238 lb 9.6 oz (108.2 kg)  09/21/22 225 lb (102.1 kg)     BP Readings from Last 3 Encounters:  10/18/22 136/89  10/11/22 136/80  09/21/22 117/80      Physical Exam- Limited  Constitutional:  Body mass index is 39.71 kg/m. , not in acute distress, normal state of mind Eyes:  EOMI, no exophthalmos Neck: Supple Cardiovascular:  2+ edema to BLE and LUE Musculoskeletal: no gross deformities, strength intact in all four extremities, no gross restriction of joint movements Skin:  no rashes, no hyperemia Neurological: no tremor with outstretched hands   Diabetic Foot Exam - Simple   No data filed     CMP ( most recent) CMP     Component Value Date/Time   NA 140 08/11/2022 1511   K 5.1 08/11/2022 1511   CL 98 08/11/2022 1511   CO2 25 08/11/2022 1511   GLUCOSE 97 08/11/2022 1511   GLUCOSE 135 (H) 08/05/2022 0041   BUN 37 (H) 08/11/2022 1511   CREATININE 2.42 (H) 08/11/2022 1511   CALCIUM 9.1 08/11/2022 1511   PROT 6.2 (L) 08/02/2022 2235   PROT 6.3 04/20/2022 1022   ALBUMIN 2.4 (L) 08/02/2022 2235   ALBUMIN 3.1 (L) 04/20/2022 1022   AST 25 08/02/2022 2235   ALT 22 08/02/2022 2235   ALKPHOS 95 08/02/2022 2235   BILITOT 0.3 08/02/2022 2235   BILITOT <0.2 04/20/2022 1022  GFRNONAA 26 (L) 08/05/2022 0041   GFRAA 31 (L) 02/01/2020 1328     Diabetic Labs (most recent): Lab Results  Component Value Date   HGBA1C 8.3 (A) 10/18/2022   HGBA1C 7.0 (H) 07/19/2022   HGBA1C 8.0 (H) 01/09/2022   MICROALBUR 150 10/19/2021     Lipid Panel ( most recent) Lipid Panel     Component Value Date/Time   CHOL 118 07/26/2022 0333   CHOL 142 04/20/2022 1022   TRIG 84 07/26/2022 0333   HDL 44 07/26/2022 0333   HDL 37 (L) 04/20/2022 1022   CHOLHDL 2.7 07/26/2022 0333   VLDL 17 07/26/2022 0333   LDLCALC 57 07/26/2022 0333   LDLCALC 81 04/20/2022 1022    LDLDIRECT 84.9 01/01/2020 0319   LABVLDL 24 04/20/2022 1022      Lab Results  Component Value Date   TSH 9.820 (H) 10/11/2022   TSH 17.600 (H) 04/20/2022   TSH 41.777 (H) 01/28/2022   TSH 25.117 (H) 08/28/2021   TSH 0.268 (L) 06/13/2020   TSH 5.860 (H) 11/17/2015   TSH 33.130 (H) 07/21/2015   TSH 40.980 (H) 03/07/2015   FREET4 1.07 10/11/2022   FREET4 1.12 04/20/2022   FREET4 1.52 11/17/2015   FREET4 0.73 (L) 07/21/2015   FREET4 0.77 (L) 03/07/2015           Assessment & Plan:   1) Type 1 diabetes mellitus with hyperglycemia (HCC)  She presents today, accompanied by her husband, with her CGM and Omnipod combo showing at goal fasting and slightly above target postprandial readings.  Her POCT A1c today is 8.3%, increasing from last visit of 7%.  Analysis of her CGM shows TIR 56%, TAR 44%, TBR 0% with a GMI of 7.9%.  She admits to eating processed foods and sweets.  She recently fell and bruised some ribs and left thumb.  Says he left foot is healing well, still in ortho boot.  - Lauren [name] has currently uncontrolled symptomatic type 1 DM since 42 years of age.   -Recent labs reviewed.  - I had a long discussion with her about the progressive nature of diabetes and the pathology behind its complications. -her diabetes is complicated by CAD, CVA, CKD, PVD, and CHF and she remains at a high risk for more acute and chronic complications which include CAD, CVA, CKD, retinopathy, and neuropathy. These are all discussed in detail with her.  The following Lifestyle Medicine recommendations according to American College of Lifestyle Medicine [facility]) were discussed and offered to patient and she agrees to start the journey:  A. Whole Foods, Plant-based plate comprising of fruits and vegetables, plant-based proteins, whole-grain carbohydrates was discussed in detail with the patient.   A list for source of those nutrients were also provided to the patient.  Patient will use only  water or unsweetened tea for hydration. B.  The need to stay away from risky substances including alcohol, smoking; obtaining 7 to 9 hours of restorative sleep, at least 150 minutes of moderate intensity exercise weekly, the importance of healthy social connections,  and stress reduction techniques were discussed. C.  A full color page of  Calorie density of various food groups per pound showing examples of each food groups was provided to the patient.  - Nutritional counseling repeated at each appointment due to patients tendency to fall back in to old habits.  - The patient admits there is a room for improvement in their diet and drink choices. -  Suggestion is made for  the patient to avoid simple carbohydrates from their diet including Cakes, Sweet Desserts / Pastries, Ice Cream, Soda (diet and regular), Sweet Tea, Candies, Chips, Cookies, Sweet Pastries, Store Bought Juices, Alcohol in Excess of 1-2 drinks a day, Artificial Sweeteners, Coffee Creamer, and "Sugar-free" Products. This will help patient to have stable blood glucose profile and potentially avoid unintended weight gain.   - I encouraged the patient to switch to unprocessed or minimally processed complex starch and increased protein intake (animal or plant source), fruits, and vegetables.   - Patient is advised to stick to a routine mealtimes to eat 3 meals a day and avoid unnecessary snacks (to snack only to correct hypoglycemia).  - I have approached her with the following individualized plan to manage her diabetes and patient agrees:   Given her type 1 diagnosis, insulin is the only effective evidence-based treatment option.  Based on her CGM data, I did increase her insulin to carb ratio from 1: 8 to 1:5 to help drive postprandial hyperglycemia down some.  -she is encouraged to continue monitoring glucose 4 times daily (using her CGM), before meals and before bed and to call the clinic if she has readings less than 70 or above  300 for 3 tests in a row.  - she is warned not to take insulin without proper monitoring per orders. - Adjustment parameters are given to her for hypo and hyperglycemia in writing.  - Specific targets for  A1c; LDL, HDL, and Triglycerides were discussed with the patient.  2) Blood Pressure /Hypertension:  her blood pressure is controlled to target.   she is advised to continue her current medications including Lasix 40 mg po daily as needed and Metoprolol 37.5 mg po twice daily.  3) Lipids/Hyperlipidemia:    Review of her recent lipid panel from 04/20/22 showed controlled LDL at 81.  she is advised to continue Lipitor 40 mg daily at bedtime.  4)  Weight/Diet:  her Body mass index is 39.71 kg/m.  -  clearly complicating her diabetes care.   she is a candidate for weight loss. I discussed with her the fact that loss of 5 - 10% of her  current body weight will have the most impact on her diabetes management.  Exercise, and detailed carbohydrates information provided  -  detailed on discharge instructions.  5) Hypothyroidism-unspecified The details surrounding her diagnosis are unavailable.  Her previsit TFTs are consistent with slight under-replacement, however she has been taking them with the rest of her medications in the morning.   She is currently on Levothyroxine 125 mcg po daily, is advised to continue but to start separating it out from her other medications to increase absorption.  Will recheck TFTs prior to next visit.   - The correct intake of thyroid hormone (Levothyroxine, Synthroid), is on empty stomach first thing in the morning, with water, separated by at least 30 minutes from breakfast and other medications,  and separated by more than 4 hours from calcium, iron, multivitamins, acid reflux medications (PPIs).  - This medication is a life-long medication and will be needed to correct thyroid hormone imbalances for the rest of your life.  The dose may change from time to time,  based on thyroid blood work.  - It is extremely important to be consistent taking this medication, near the same time each morning.  -AVOID TAKING PRODUCTS CONTAINING BIOTIN (commonly found in Hair, Skin, Nails vitamins) AS IT INTERFERES WITH THE VALIDITY OF THYROID FUNCTION BLOOD TESTS.  6)  Chronic Care/Health Maintenance: -she is not on ACEI/ARB and is on Statin medications and is encouraged to initiate and continue to follow up with Ophthalmology, Dentist, Podiatrist at least yearly or according to recommendations, and advised to stay away from smoking. I have recommended yearly flu vaccine and pneumonia vaccine at least every 5 years; moderate intensity exercise for up to 150 minutes weekly; and sleep for at least 7 hours a day.  - she is advised to maintain close follow up with Lauren Nora Lucina Mellow, MD for primary care needs, as well as her other providers for optimal and coordinated care.     I spent  45  minutes in the care of the patient today including review of labs from [facility], Lipids, Thyroid Function, Hematology (current and previous including abstractions from other facilities); face-to-face time discussing  her blood glucose readings/logs, discussing hypoglycemia and hyperglycemia episodes and symptoms, medications doses, her options of short and long term treatment based on the latest standards of care / guidelines;  discussion about incorporating lifestyle medicine;  and documenting the encounter. Risk reduction counseling performed per USPSTF guidelines to reduce obesity and cardiovascular risk factors.     Please refer to Patient Instructions for Blood Glucose Monitoring and Insulin/Medications Dosing Guide"  in media tab for additional information. Please  also refer to " Patient Self Inventory" in the Media  tab for reviewed elements of pertinent patient history.  Lauren [name] participated in the discussions, expressed understanding, and voiced agreement with the above plans.  All  questions were answered to her satisfaction. she is encouraged to contact clinic should she have any questions or concerns prior to her return visit.     Follow up plan: - Return in about 4 months (around 02/17/2023) for Diabetes F/U with A1c in office, Thyroid follow up, Previsit labs, Bring meter and logs.   Ronny Bacon, [facility] [address] [place], [place] [address] Phone: [phone] Fax: [phone]  10/18/2022, 9:47 AM

## 2022-10-19 ENCOUNTER — Ambulatory Visit: Payer: Commercial Managed Care - PPO | Attending: Cardiology | Admitting: *Deleted

## 2022-10-19 DIAGNOSIS — I639 Cerebral infarction, unspecified: Secondary | ICD-10-CM | POA: Diagnosis not present

## 2022-10-19 DIAGNOSIS — Z5181 Encounter for therapeutic drug level monitoring: Secondary | ICD-10-CM | POA: Diagnosis not present

## 2022-10-19 LAB — POCT INR: INR: 5.4 — AB (ref 2.0–3.0)

## 2022-10-19 NOTE — Patient Instructions (Addendum)
Hold warfarin Wednesday and Thursday, take 1/2 tablet on Friday then decrease dose to 1 tablet daily   Recheck in 1 wk

## 2022-10-20 ENCOUNTER — Other Ambulatory Visit: Payer: Self-pay

## 2022-10-20 ENCOUNTER — Encounter: Payer: Self-pay | Admitting: Internal Medicine

## 2022-10-20 MED ORDER — RANOLAZINE ER 1000 MG PO TB12: 1000.0000 mg | ORAL_TABLET | Freq: Two times a day (BID) | ORAL | 1 refills | Status: DC

## 2022-10-25 DIAGNOSIS — Z87891 Personal history of nicotine dependence: Secondary | ICD-10-CM | POA: Diagnosis not present

## 2022-10-25 DIAGNOSIS — G9389 Other specified disorders of brain: Secondary | ICD-10-CM | POA: Diagnosis not present

## 2022-10-25 DIAGNOSIS — R4182 Altered mental status, unspecified: Secondary | ICD-10-CM | POA: Diagnosis not present

## 2022-10-25 DIAGNOSIS — N3000 Acute cystitis without hematuria: Secondary | ICD-10-CM | POA: Diagnosis not present

## 2022-10-25 DIAGNOSIS — R569 Unspecified convulsions: Secondary | ICD-10-CM | POA: Diagnosis not present

## 2022-10-25 DIAGNOSIS — I10 Essential (primary) hypertension: Secondary | ICD-10-CM | POA: Diagnosis not present

## 2022-10-25 DIAGNOSIS — E119 Type 2 diabetes mellitus without complications: Secondary | ICD-10-CM | POA: Diagnosis not present

## 2022-10-25 DIAGNOSIS — Z86718 Personal history of other venous thrombosis and embolism: Secondary | ICD-10-CM | POA: Diagnosis not present

## 2022-10-25 DIAGNOSIS — R9431 Abnormal electrocardiogram [ECG] [EKG]: Secondary | ICD-10-CM | POA: Diagnosis not present

## 2022-10-25 DIAGNOSIS — I252 Old myocardial infarction: Secondary | ICD-10-CM | POA: Diagnosis not present

## 2022-10-25 DIAGNOSIS — I6523 Occlusion and stenosis of bilateral carotid arteries: Secondary | ICD-10-CM | POA: Diagnosis not present

## 2022-10-25 DIAGNOSIS — Z881 Allergy status to other antibiotic agents status: Secondary | ICD-10-CM | POA: Diagnosis not present

## 2022-10-25 DIAGNOSIS — Z8673 Personal history of transient ischemic attack (TIA), and cerebral infarction without residual deficits: Secondary | ICD-10-CM | POA: Diagnosis not present

## 2022-10-26 ENCOUNTER — Encounter (HOSPITAL_COMMUNITY): Payer: BC Managed Care – PPO

## 2022-10-27 ENCOUNTER — Ambulatory Visit (INDEPENDENT_AMBULATORY_CARE_PROVIDER_SITE_OTHER): Payer: Commercial Managed Care - PPO | Admitting: *Deleted

## 2022-10-27 DIAGNOSIS — Z5181 Encounter for therapeutic drug level monitoring: Secondary | ICD-10-CM

## 2022-10-27 DIAGNOSIS — I639 Cerebral infarction, unspecified: Secondary | ICD-10-CM | POA: Diagnosis not present

## 2022-10-27 LAB — POCT INR: INR: 2.5 (ref 2.0–3.0)

## 2022-10-27 NOTE — Patient Instructions (Signed)
Continue warfarin 1 tablet daily ?Recheck in 3 wks ?

## 2022-10-28 ENCOUNTER — Encounter: Payer: Self-pay | Admitting: Internal Medicine

## 2022-10-28 ENCOUNTER — Ambulatory Visit (INDEPENDENT_AMBULATORY_CARE_PROVIDER_SITE_OTHER): Payer: Commercial Managed Care - PPO

## 2022-10-28 ENCOUNTER — Ambulatory Visit: Payer: Commercial Managed Care - PPO | Admitting: Internal Medicine

## 2022-10-28 ENCOUNTER — Ambulatory Visit: Payer: Commercial Managed Care - PPO | Admitting: Orthopedic Surgery

## 2022-10-28 ENCOUNTER — Encounter: Payer: Self-pay | Admitting: Orthopedic Surgery

## 2022-10-28 VITALS — BP 147/91 | HR 86 | Ht 65.0 in | Wt 224.0 lb

## 2022-10-28 VITALS — BP 135/79 | HR 83 | Ht 65.0 in | Wt 223.9 lb

## 2022-10-28 DIAGNOSIS — S62512A Displaced fracture of proximal phalanx of left thumb, initial encounter for closed fracture: Secondary | ICD-10-CM

## 2022-10-28 DIAGNOSIS — R569 Unspecified convulsions: Secondary | ICD-10-CM | POA: Insufficient documentation

## 2022-10-28 DIAGNOSIS — M20012 Mallet finger of left finger(s): Secondary | ICD-10-CM

## 2022-10-28 DIAGNOSIS — N3000 Acute cystitis without hematuria: Secondary | ICD-10-CM | POA: Diagnosis not present

## 2022-10-28 DIAGNOSIS — G8929 Other chronic pain: Secondary | ICD-10-CM

## 2022-10-28 NOTE — Patient Instructions (Signed)
It was a pleasure to see you today.  Thank you for giving [name] the opportunity to be involved in your care.  Below is a brief recap of your visit and next steps.  We will plan to see you again in July.  Summary No medication changes today Complete antibiotics as prescribed I recommend taking keppra as prescribed in the emergency department Follow up in July

## 2022-10-28 NOTE — Progress Notes (Signed)
Established Patient Office Visit  Subjective   Patient ID: Lauren [name], female    DOB: 26-Aug-1980  Age: 42 y.o. MRN: [id number]  Chief Complaint  Patient presents with   er follow up    Follow up   Lauren [name] returns to care today for ER follow-up.  Lauren [name] presented to [facility] on 6/24 for seizure-like activity.  Lauren [name] describes sitting at home and attempting to lift Lauren [name] left arm when it began to shake.  Lauren [name] was conscious during this episode as left arm shaking gradually progressed into full body convulsions.  There was no tongue biting or bowel/bladder incontinence.  Lauren [name] did not experience a postictal state.  An additional episode of seizure-like activity is documented during Lauren [name] ED encounter.  There was no postictal state.  Keppra IV was ordered for Keppra loading, but Lauren [name] declined due to concern that it may further damage Lauren [name] kidneys.  Lab workup revealed a UTI.  Lauren [name] is currently being treated with Keflex 500 mg 3 times daily x 10 days.  Lauren [name] reports feeling fairly well today.  Lauren [name] has not had any additional episodes of seizure-like activity since hospital discharge.  Lauren [name] does not have any additional concerns to discuss today.  Past Medical History:  Diagnosis Date   Anemia    CAD (coronary artery disease)    a. s/p cath in 03/2014 showing 30% mid-LAD, moderate to severe disease along small D1, patent LCx, moderate to severe distal OM2 stenosis and moderate diffuse diease along RCA not amenable to PCI   CHF (congestive heart failure) (HCC)    a. EF 55-60% in 12/2019 b. EF at 35-40% by echo in 05/2020   CKD (chronic kidney disease) stage 4, GFR 15-29 ml/min (HCC)    Diabetes mellitus without complication (HCC)    Myocardial infarction (HCC)    Neuropathy    PVD (peripheral vascular disease) (HCC)    Stroke (HCC) 01/2022   L hand weakness   Past Surgical History:  Procedure Laterality Date   ABDOMINAL AORTOGRAM W/LOWER EXTREMITY N/A 06/10/2022   Procedure: ABDOMINAL  AORTOGRAM W/LOWER EXTREMITY;  Surgeon: Cephus Shelling, MD;  Location: [facility] INVASIVE CV LAB;  Service: Cardiovascular;  Laterality: N/A;   AMPUTATION Left 09/02/2021   Procedure: AMPUTATION RAY;  Surgeon: Edwin Cap, DPM;  Location: [facility] OR;  Service: Podiatry;  Laterality: Left;  sagittal saw, 3L bag saline & Pulse   Cardiac catherization     CHOLECYSTECTOMY     I & D EXTREMITY Left 08/30/2021   Procedure: IRRIGATION AND DEBRIDEMENT WITH BONE BIOPSY;  Surgeon: Candelaria Stagers, DPM;  Location: [facility] OR;  Service: Podiatry;  Laterality: Left;   IR ANGIO VERTEBRAL SEL SUBCLAVIAN INNOMINATE UNI L MOD SED  01/18/2022   IR CT HEAD LTD  01/08/2022   IR INTRA CRAN STENT  01/08/2022   IR PERCUTANEOUS ART THROMBECTOMY/INFUSION INTRACRANIAL INC DIAG ANGIO  01/08/2022   IRRIGATION AND DEBRIDEMENT FOOT Left 09/04/2021   Procedure: IRRIGATION AND DEBRIDEMENT FOOT AND CLOSURE;  Surgeon: Edwin Cap, DPM;  Location: [facility] OR;  Service: Podiatry;  Laterality: Left;   LEFT HEART CATH AND CORONARY ANGIOGRAPHY N/A 07/26/2022   Procedure: LEFT HEART CATH AND CORONARY ANGIOGRAPHY;  Surgeon: [place], Peter M, MD;  Location: [facility] INVASIVE CV LAB;  Service: Cardiovascular;  Laterality: N/A;   METATARSAL OSTEOTOMY Left 07/21/2022   Procedure: LEFT FOOT EXCISION OF FIFTH METATARSAL WITH DEBRIDEMENT OF ULCER, POSSIBLE BONE BIOPSY OF THE FOURTH METATARSAL, POSSIBLE FOURTH RAY AMPUTATION OF  LEFT TOEAND METATARSAL;  Surgeon: Vivi Barrack, DPM;  Location: [facility] OR;  Service: Podiatry;  Laterality: Left;   PERIPHERAL VASCULAR BALLOON ANGIOPLASTY Left 06/10/2022   Procedure: PERIPHERAL VASCULAR BALLOON ANGIOPLASTY;  Surgeon: Cephus Shelling, MD;  Location: [facility];  Service: Cardiovascular;  Laterality: Left;  AT and DP   PERIPHERAL VASCULAR INTERVENTION Left 06/10/2022   Procedure: PERIPHERAL VASCULAR INTERVENTION;  Surgeon: Cephus Shelling, MD;  Location: [facility] INVASIVE CV LAB;  Service: Cardiovascular;  Laterality: Left;   SFA   RADIOLOGY WITH ANESTHESIA N/A 01/08/2022   Procedure: IR WITH ANESTHESIA;  Surgeon: Radiologist, Medication, MD;  Location: [facility] OR;  Service: Radiology;  Laterality: N/A;   THROMBECTOMY BRACHIAL ARTERY Right 11/18/2021   Procedure: RIGHT BRACHIAL, RADIAL, & ULNAR ARTERY THROMBECTOMY.;  Surgeon: Cephus Shelling, MD;  Location: [facility] OR;  Service: Vascular;  Laterality: Right;   TUBAL LIGATION     Social History   Tobacco Use   Smoking status: Former    Packs/day: 1.00    Years: 15.00    Additional pack years: 0.00    Total pack years: 15.00    Types: Cigarettes    Quit date: 01/2022    Years since quitting: 0.8    Passive exposure: Past   Smokeless tobacco: Never  Vaping Use   Vaping Use: Never used  Substance Use Topics   Alcohol use: Yes    Comment: occ   Drug use: No   Family History  Problem Relation Age of Onset   Thyroid disease Mother    Hypertension Mother    Hyperlipidemia Mother    CAD Mother    CVA Mother    Hyperlipidemia Father    Hypertension Father    CAD Father    Breast cancer Paternal Grandmother 97   Cancer Paternal Grandmother        lung   Allergies  Allergen Reactions   Visipaque [Iodixanol] Nausea And Vomiting    Patient had vagal response during procedure became hypertensive, and bradycardic. CO2 used during procedure prior to contrast being used, this may be cause as well. Recommended premedicating prior to contrast being used in the future.    Wellbutrin [Bupropion] Hives   Gabapentin     dizzy   Cefepime Rash    Tolerates penicilllin   Ciprofloxacin Hcl Hives and Rash    Hives/rash at injection site; 01/15/22 tolerated IV cipro   Tape Rash   Review of Systems  Constitutional:  Negative for chills and fever.  HENT:  Negative for sore throat.   Respiratory:  Negative for cough and shortness of breath.   Cardiovascular:  Negative for chest pain, palpitations and leg swelling.  Gastrointestinal:  Negative for abdominal pain, blood  in stool, constipation, diarrhea, nausea and vomiting.  Genitourinary:  Negative for dysuria and hematuria.  Musculoskeletal:  Negative for myalgias.  Skin:  Negative for itching and rash.  Neurological:  Negative for dizziness and headaches.  Psychiatric/Behavioral:  Negative for depression and suicidal ideas.       Objective:     BP 135/79   Pulse 83   Ht 5\' 5"  (1.651 m)   Wt 223 lb 14.4 oz (101.6 kg)   SpO2 97%   BMI 37.26 kg/m  BP Readings from Last 3 Encounters:  10/28/22 135/79  10/28/22 (!) 147/91  10/18/22 136/89   Physical Exam Vitals reviewed.  Constitutional:      Comments: Examined in wheelchair  HENT:     Head: Normocephalic  and atraumatic.     Right Ear: External ear normal.     Left Ear: External ear normal.     Nose: Nose normal.     Mouth/Throat:     Mouth: Mucous membranes are moist.     Pharynx: Oropharynx is clear. No oropharyngeal exudate or posterior oropharyngeal erythema.  Eyes:     General: No scleral icterus.    Extraocular Movements: Extraocular movements intact.     Conjunctiva/sclera: Conjunctivae normal.     Pupils: Pupils are equal, round, and reactive to light.  Cardiovascular:     Rate and Rhythm: Normal rate and regular rhythm.     Pulses: Normal pulses.     Heart sounds: Murmur heard.  Pulmonary:     Effort: Pulmonary effort is normal.     Breath sounds: Normal breath sounds. No wheezing, rhonchi or rales.  Abdominal:     General: Abdomen is flat. Bowel sounds are normal.     Palpations: Abdomen is soft.  Musculoskeletal:        General: Swelling (L thumb) present.     Cervical [name]: Normal range of motion.  Skin:    General: Skin is warm and dry.  Neurological:     Mental Status: Lauren [name] is alert and oriented to person, place, and time.     Cranial Nerves: No cranial nerve deficit.     Motor: Weakness (Left arm and leg.  Weakness is more prominent in the left upper extremity) present.  Psychiatric:        Mood and Affect:  Mood normal.        Behavior: Behavior normal.   Last CBC Lab Results  Component Value Date   WBC 10.0 08/11/2022   HGB 10.6 (L) 08/11/2022   HCT 33.4 (L) 08/11/2022   MCV 87 08/11/2022   MCH 27.5 08/11/2022   RDW 14.0 08/11/2022   PLT 567 (H) 08/11/2022   Last metabolic panel Lab Results  Component Value Date   GLUCOSE 97 08/11/2022   NA 140 08/11/2022   K 5.1 08/11/2022   CL 98 08/11/2022   CO2 25 08/11/2022   BUN 37 (H) 08/11/2022   CREATININE 2.42 (H) 08/11/2022   EGFR 25 (L) 08/11/2022   CALCIUM 9.1 08/11/2022   PHOS 4.4 01/29/2022   PROT 6.2 (L) 08/02/2022   ALBUMIN 2.4 (L) 08/02/2022   LABGLOB 3.2 04/20/2022   AGRATIO 1.0 (L) 04/20/2022   BILITOT 0.3 08/02/2022   ALKPHOS 95 08/02/2022   AST 25 08/02/2022   ALT 22 08/02/2022   ANIONGAP 9 08/05/2022   Last lipids Lab Results  Component Value Date   CHOL 118 07/26/2022   HDL 44 07/26/2022   LDLCALC 57 07/26/2022   LDLDIRECT 84.9 01/01/2020   TRIG 84 07/26/2022   CHOLHDL 2.7 07/26/2022   Last hemoglobin A1c Lab Results  Component Value Date   HGBA1C 8.3 (A) 10/18/2022   Last thyroid functions Lab Results  Component Value Date   TSH 9.820 (H) 10/11/2022   Last vitamin D Lab Results  Component Value Date   VD25OH 15.9 (L) 06/01/2022   Last vitamin B12 and Folate Lab Results  Component Value Date   VITAMINB12 433 04/20/2022   FOLATE 10.3 04/20/2022     Assessment & Plan:   Problem List Items Addressed This Visit       Acute cystitis without hematuria    Urine study earlier this week was concerning for UTI, which potentially triggered seizure-like activity.  Lauren [name] was discharged with  Keflex 500 mg 3 times daily x 10 days. -Complete antibiotic treatment as prescribed      Seizure-like activity (HCC) - Primary    Presenting today for ER follow-up in the setting of recent seizure-like activity.  No prior history of seizures.  Episodes are described as generalized convulsions with no postictal  state, no LOC, no tongue biting, and no bowel/bladder incontinence.  IV Keppra load was declined.  Lauren [name] was prescribed Keppra 500 mg twice daily upon ER discharge but has not been taking it due to concern for renal impairment.  Lab workup revealed UTI. -I have recommended that Lauren [name] schedule follow-up with Lauren [name] neurologist for further evaluation.  In the interim, I have recommended that Lauren [name] take Keppra as prescribed upon ER discharge. -Lauren [name] does not currently drive, but driving restrictions were reviewed.      Return if symptoms worsen or fail to improve.   Billie Lade, MD

## 2022-10-28 NOTE — Patient Instructions (Signed)
[facility] will call you with appointment. If you have not heard from them in 2-3 business days call them to schedule [phone]   

## 2022-10-28 NOTE — Assessment & Plan Note (Signed)
Urine study earlier this week was concerning for UTI, which potentially triggered seizure-like activity.  She was discharged with Keflex 500 mg 3 times daily x 10 days. -Complete antibiotic treatment as prescribed

## 2022-10-28 NOTE — Progress Notes (Signed)
Chief Complaint  Patient presents with   Hand Pain    Fell 1-2 weeks ago and injured 1st metacarpal left hand per  she has multiple fractures     42 year old female with a history of a stroke left her with edema in her left upper extremity fell on June 10 presented to her primary care on June 12 where imaging was obtained and it showed a fracture of the proximal phalanx of the left thumb and a intra-articular fracture of the DIP with large bony mallet component presents now for evaluation and management  Imaging repeated show significant displacement of the bony mallet fracture and a nondisplaced or minimally displaced fracture at the base of the first or thumb proximal phalanx left hand  Exam shows the patient has a lot of edema but this is chronic in her left hand she has a flexion deformity of the DIP joint of the left thumb and no extension  There are no neurovascular deficits  X-rays viewed hospital and my interpretation is that she has a large bony mallet intra-articular fracture left thumb IP joint with a proximal phalanx fracture at the proximal end millet displaced  Recommend consult with hand surgery for management.  They may recommend nonsurgical treatment in the setting

## 2022-10-28 NOTE — Assessment & Plan Note (Signed)
Presenting today for ER follow-up in the setting of recent seizure-like activity.  No prior history of seizures.  Episodes are described as generalized convulsions with no postictal state, no LOC, no tongue biting, and no bowel/bladder incontinence.  IV Keppra load was declined.  She was prescribed Keppra 500 mg twice daily upon ER discharge but has not been taking it due to concern for renal impairment.  Lab workup revealed UTI. -I have recommended that [name] schedule follow-up with her neurologist for further evaluation.  In the interim, I have recommended that she take Keppra as prescribed upon ER discharge. -She does not currently drive, but driving restrictions were reviewed.

## 2022-11-03 ENCOUNTER — Other Ambulatory Visit: Payer: Self-pay | Admitting: Internal Medicine

## 2022-11-03 DIAGNOSIS — R110 Nausea: Secondary | ICD-10-CM

## 2022-11-07 ENCOUNTER — Other Ambulatory Visit: Payer: Self-pay

## 2022-11-07 ENCOUNTER — Inpatient Hospital Stay (HOSPITAL_COMMUNITY)
Admission: EM | Admit: 2022-11-07 | Discharge: 2022-11-12 | DRG: 280 | Disposition: A | Discharge status: Home / Self Care | Payer: Commercial Managed Care - PPO | Attending: Internal Medicine | Admitting: Internal Medicine

## 2022-11-07 ENCOUNTER — Emergency Department (HOSPITAL_COMMUNITY): Payer: Commercial Managed Care - PPO

## 2022-11-07 ENCOUNTER — Encounter (HOSPITAL_COMMUNITY): Payer: Self-pay | Admitting: Internal Medicine

## 2022-11-07 DIAGNOSIS — I739 Peripheral vascular disease, unspecified: Secondary | ICD-10-CM | POA: Diagnosis present

## 2022-11-07 DIAGNOSIS — E039 Hypothyroidism, unspecified: Secondary | ICD-10-CM | POA: Diagnosis present

## 2022-11-07 DIAGNOSIS — Z8349 Family history of other endocrine, nutritional and metabolic diseases: Secondary | ICD-10-CM

## 2022-11-07 DIAGNOSIS — I69354 Hemiplegia and hemiparesis following cerebral infarction affecting left non-dominant side: Secondary | ICD-10-CM

## 2022-11-07 DIAGNOSIS — Z83438 Family history of other disorder of lipoprotein metabolism and other lipidemia: Secondary | ICD-10-CM

## 2022-11-07 DIAGNOSIS — E1022 Type 1 diabetes mellitus with diabetic chronic kidney disease: Secondary | ICD-10-CM | POA: Diagnosis present

## 2022-11-07 DIAGNOSIS — I200 Unstable angina: Secondary | ICD-10-CM | POA: Diagnosis not present

## 2022-11-07 DIAGNOSIS — E1051 Type 1 diabetes mellitus with diabetic peripheral angiopathy without gangrene: Secondary | ICD-10-CM | POA: Diagnosis present

## 2022-11-07 DIAGNOSIS — I5043 Acute on chronic combined systolic (congestive) and diastolic (congestive) heart failure: Secondary | ICD-10-CM | POA: Diagnosis present

## 2022-11-07 DIAGNOSIS — Z91048 Other nonmedicinal substance allergy status: Secondary | ICD-10-CM

## 2022-11-07 DIAGNOSIS — Z79899 Other long term (current) drug therapy: Secondary | ICD-10-CM

## 2022-11-07 DIAGNOSIS — I2510 Atherosclerotic heart disease of native coronary artery without angina pectoris: Secondary | ICD-10-CM | POA: Diagnosis present

## 2022-11-07 DIAGNOSIS — E1059 Type 1 diabetes mellitus with other circulatory complications: Secondary | ICD-10-CM

## 2022-11-07 DIAGNOSIS — I252 Old myocardial infarction: Secondary | ICD-10-CM

## 2022-11-07 DIAGNOSIS — I10 Essential (primary) hypertension: Secondary | ICD-10-CM | POA: Diagnosis present

## 2022-11-07 DIAGNOSIS — E10621 Type 1 diabetes mellitus with foot ulcer: Secondary | ICD-10-CM | POA: Diagnosis present

## 2022-11-07 DIAGNOSIS — I214 Non-ST elevation (NSTEMI) myocardial infarction: Secondary | ICD-10-CM | POA: Diagnosis not present

## 2022-11-07 DIAGNOSIS — E669 Obesity, unspecified: Secondary | ICD-10-CM | POA: Diagnosis present

## 2022-11-07 DIAGNOSIS — Z7901 Long term (current) use of anticoagulants: Secondary | ICD-10-CM

## 2022-11-07 DIAGNOSIS — E1040 Type 1 diabetes mellitus with diabetic neuropathy, unspecified: Secondary | ICD-10-CM | POA: Diagnosis present

## 2022-11-07 DIAGNOSIS — I25119 Atherosclerotic heart disease of native coronary artery with unspecified angina pectoris: Secondary | ICD-10-CM | POA: Diagnosis present

## 2022-11-07 DIAGNOSIS — Z79890 Hormone replacement therapy (postmenopausal): Secondary | ICD-10-CM

## 2022-11-07 DIAGNOSIS — Z89422 Acquired absence of other left toe(s): Secondary | ICD-10-CM

## 2022-11-07 DIAGNOSIS — I130 Hypertensive heart and chronic kidney disease with heart failure and stage 1 through stage 4 chronic kidney disease, or unspecified chronic kidney disease: Secondary | ICD-10-CM | POA: Diagnosis present

## 2022-11-07 DIAGNOSIS — Z803 Family history of malignant neoplasm of breast: Secondary | ICD-10-CM

## 2022-11-07 DIAGNOSIS — N179 Acute kidney failure, unspecified: Secondary | ICD-10-CM | POA: Diagnosis present

## 2022-11-07 DIAGNOSIS — Z823 Family history of stroke: Secondary | ICD-10-CM

## 2022-11-07 DIAGNOSIS — Z9851 Tubal ligation status: Secondary | ICD-10-CM

## 2022-11-07 DIAGNOSIS — Z6838 Body mass index (BMI) 38.0-38.9, adult: Secondary | ICD-10-CM

## 2022-11-07 DIAGNOSIS — Z9641 Presence of insulin pump (external) (internal): Secondary | ICD-10-CM | POA: Diagnosis present

## 2022-11-07 DIAGNOSIS — Z95820 Peripheral vascular angioplasty status with implants and grafts: Secondary | ICD-10-CM

## 2022-11-07 DIAGNOSIS — Z87891 Personal history of nicotine dependence: Secondary | ICD-10-CM

## 2022-11-07 DIAGNOSIS — Z8249 Family history of ischemic heart disease and other diseases of the circulatory system: Secondary | ICD-10-CM

## 2022-11-07 DIAGNOSIS — M1990 Unspecified osteoarthritis, unspecified site: Secondary | ICD-10-CM | POA: Diagnosis present

## 2022-11-07 DIAGNOSIS — I255 Ischemic cardiomyopathy: Secondary | ICD-10-CM | POA: Diagnosis present

## 2022-11-07 DIAGNOSIS — Z66 Do not resuscitate: Secondary | ICD-10-CM | POA: Diagnosis present

## 2022-11-07 DIAGNOSIS — I25118 Atherosclerotic heart disease of native coronary artery with other forms of angina pectoris: Secondary | ICD-10-CM | POA: Diagnosis present

## 2022-11-07 DIAGNOSIS — N184 Chronic kidney disease, stage 4 (severe): Secondary | ICD-10-CM | POA: Diagnosis present

## 2022-11-07 DIAGNOSIS — Z882 Allergy status to sulfonamides status: Secondary | ICD-10-CM

## 2022-11-07 DIAGNOSIS — D6862 Lupus anticoagulant syndrome: Secondary | ICD-10-CM | POA: Diagnosis present

## 2022-11-07 DIAGNOSIS — Z7902 Long term (current) use of antithrombotics/antiplatelets: Secondary | ICD-10-CM

## 2022-11-07 DIAGNOSIS — E782 Mixed hyperlipidemia: Secondary | ICD-10-CM | POA: Diagnosis present

## 2022-11-07 DIAGNOSIS — Z794 Long term (current) use of insulin: Secondary | ICD-10-CM

## 2022-11-07 DIAGNOSIS — Z8673 Personal history of transient ischemic attack (TIA), and cerebral infarction without residual deficits: Secondary | ICD-10-CM

## 2022-11-07 DIAGNOSIS — Z881 Allergy status to other antibiotic agents status: Secondary | ICD-10-CM

## 2022-11-07 DIAGNOSIS — Z888 Allergy status to other drugs, medicaments and biological substances status: Secondary | ICD-10-CM

## 2022-11-07 LAB — CBC
HCT: 33.9 % — ABNORMAL LOW (ref 36.0–46.0)
Hemoglobin: 10.2 g/dL — ABNORMAL LOW (ref 12.0–15.0)
MCH: 25.6 pg — ABNORMAL LOW (ref 26.0–34.0)
MCHC: 30.1 g/dL (ref 30.0–36.0)
MCV: 85.2 fL (ref 80.0–100.0)
Platelets: 446 10*3/uL — ABNORMAL HIGH (ref 150–400)
RBC: 3.98 MIL/uL (ref 3.87–5.11)
RDW: 17.9 % — ABNORMAL HIGH (ref 11.5–15.5)
WBC: 10.2 10*3/uL (ref 4.0–10.5)
nRBC: 0 % (ref 0.0–0.2)

## 2022-11-07 LAB — BASIC METABOLIC PANEL
Anion gap: 11 (ref 5–15)
BUN: 31 mg/dL — ABNORMAL HIGH (ref 6–20)
CO2: 24 mmol/L (ref 22–32)
Calcium: 8.7 mg/dL — ABNORMAL LOW (ref 8.9–10.3)
Chloride: 103 mmol/L (ref 98–111)
Creatinine, Ser: 2.76 mg/dL — ABNORMAL HIGH (ref 0.44–1.00)
GFR, Estimated: 22 mL/min — ABNORMAL LOW (ref >60)
Glucose, Bld: 183 mg/dL — ABNORMAL HIGH (ref 70–99)
Potassium: 4.6 mmol/L (ref 3.5–5.1)
Sodium: 138 mmol/L (ref 135–145)

## 2022-11-07 LAB — TROPONIN I (HIGH SENSITIVITY)
Troponin I (High Sensitivity): 923 ng/L (ref <18)
Troponin I (High Sensitivity): 990 ng/L (ref <18)
Troponin I (High Sensitivity): 961 ng/L (ref <18)
Troponin I (High Sensitivity): 856 ng/L (ref <18)

## 2022-11-07 LAB — HCG, SERUM, QUALITATIVE: Preg, Serum: NEGATIVE

## 2022-11-07 LAB — PROTIME-INR
INR: 3.1 — ABNORMAL HIGH (ref 0.8–1.2)
Prothrombin Time: 32.1 seconds — ABNORMAL HIGH (ref 11.4–15.2)

## 2022-11-07 LAB — GLUCOSE, CAPILLARY: Glucose-Capillary: 97 mg/dL (ref 70–99)

## 2022-11-07 LAB — BRAIN NATRIURETIC PEPTIDE: B Natriuretic Peptide: 1973.2 pg/mL — ABNORMAL HIGH (ref 0.0–100.0)

## 2022-11-07 MED ORDER — NITROGLYCERIN 0.4 MG SL SUBL: 0.4000 mg | SUBLINGUAL_TABLET | SUBLINGUAL | Status: DC | PRN

## 2022-11-07 MED ORDER — ISOSORBIDE MONONITRATE 20 MG PO TABS
40.0000 mg | ORAL_TABLET | Freq: Two times a day (BID) | ORAL | Status: DC
  Administered 2022-11-07 – 2022-11-08 (×2): 40 mg via ORAL
  Filled 2022-11-07 (×3): qty 2

## 2022-11-07 MED ORDER — RANOLAZINE ER 500 MG PO TB12
1000.0000 mg | ORAL_TABLET | Freq: Two times a day (BID) | ORAL | Status: DC
  Administered 2022-11-07 – 2022-11-12 (×10): 1000 mg via ORAL
  Filled 2022-11-07 (×10): qty 2

## 2022-11-07 MED ORDER — LACTATED RINGERS IV SOLN: INTRAVENOUS | Status: DC

## 2022-11-07 MED ORDER — CALCIUM CARBONATE ANTACID 500 MG PO CHEW
400.0000 mg | CHEWABLE_TABLET | Freq: Three times a day (TID) | ORAL | Status: DC | PRN
  Administered 2022-11-07: 400 mg via ORAL
  Filled 2022-11-07: qty 2

## 2022-11-07 MED ORDER — AMLODIPINE BESYLATE 5 MG PO TABS
5.0000 mg | ORAL_TABLET | Freq: Every day | ORAL | Status: DC
  Administered 2022-11-07 – 2022-11-09 (×3): 5 mg via ORAL
  Filled 2022-11-07 (×3): qty 1

## 2022-11-07 MED ORDER — INSULIN PUMP
SUBCUTANEOUS | Status: DC
  Filled 2022-11-07: qty 1

## 2022-11-07 MED ORDER — HYDRALAZINE HCL 25 MG PO TABS
25.0000 mg | ORAL_TABLET | Freq: Three times a day (TID) | ORAL | Status: DC
  Administered 2022-11-07 – 2022-11-12 (×15): 25 mg via ORAL
  Filled 2022-11-07 (×15): qty 1

## 2022-11-07 MED ORDER — SCOPOLAMINE 1 MG/3DAYS TD PT72
1.0000 | MEDICATED_PATCH | TRANSDERMAL | Status: DC
  Administered 2022-11-09 – 2022-11-10 (×2): 1.5 mg via TRANSDERMAL
  Filled 2022-11-07 (×2): qty 1

## 2022-11-07 MED ORDER — ATORVASTATIN CALCIUM 40 MG PO TABS: 40.0000 mg | ORAL_TABLET | Freq: Every day | ORAL | Status: DC

## 2022-11-07 MED ORDER — MORPHINE SULFATE (PF) 2 MG/ML IV SOLN
2.0000 mg | Freq: Once | INTRAVENOUS | Status: AC
  Administered 2022-11-07: 2 mg via INTRAVENOUS
  Filled 2022-11-07: qty 1

## 2022-11-07 MED ORDER — METOPROLOL SUCCINATE ER 100 MG PO TB24
100.0000 mg | ORAL_TABLET | Freq: Every day | ORAL | Status: DC
  Administered 2022-11-07 – 2022-11-12 (×6): 100 mg via ORAL
  Filled 2022-11-07 (×6): qty 1

## 2022-11-07 MED ORDER — TIZANIDINE HCL 4 MG PO TABS
4.0000 mg | ORAL_TABLET | Freq: Every day | ORAL | Status: DC
  Administered 2022-11-07 – 2022-11-11 (×5): 4 mg via ORAL
  Filled 2022-11-07 (×5): qty 1

## 2022-11-07 MED ORDER — ONDANSETRON HCL 4 MG/2ML IJ SOLN
4.0000 mg | Freq: Four times a day (QID) | INTRAMUSCULAR | Status: DC | PRN
  Administered 2022-11-08: 4 mg via INTRAVENOUS
  Filled 2022-11-07: qty 2

## 2022-11-07 MED ORDER — ACETAMINOPHEN 325 MG PO TABS
650.0000 mg | ORAL_TABLET | ORAL | Status: DC | PRN
  Administered 2022-11-08 – 2022-11-10 (×3): 650 mg via ORAL
  Filled 2022-11-07 (×3): qty 2

## 2022-11-07 MED ORDER — LEVOTHYROXINE SODIUM 25 MCG PO TABS
125.0000 ug | ORAL_TABLET | Freq: Every day | ORAL | Status: DC
  Administered 2022-11-08 – 2022-11-12 (×5): 125 ug via ORAL
  Filled 2022-11-07 (×5): qty 1

## 2022-11-07 MED ORDER — GABAPENTIN 100 MG PO CAPS
100.0000 mg | ORAL_CAPSULE | Freq: Three times a day (TID) | ORAL | Status: DC
  Administered 2022-11-07 – 2022-11-08 (×2): 100 mg via ORAL
  Filled 2022-11-07 (×3): qty 1

## 2022-11-07 MED ORDER — OXYCODONE-ACETAMINOPHEN 7.5-325 MG PO TABS: 1.0000 | ORAL_TABLET | Freq: Every day | ORAL | Status: DC | PRN

## 2022-11-07 MED ORDER — OXYCODONE-ACETAMINOPHEN 7.5-325 MG PO TABS
1.0000 | ORAL_TABLET | Freq: Four times a day (QID) | ORAL | Status: DC | PRN
  Administered 2022-11-07 – 2022-11-12 (×11): 1 via ORAL
  Filled 2022-11-07 (×11): qty 1

## 2022-11-07 MED ORDER — CLOPIDOGREL BISULFATE 75 MG PO TABS
75.0000 mg | ORAL_TABLET | Freq: Every day | ORAL | Status: DC
  Administered 2022-11-07 – 2022-11-12 (×6): 75 mg via ORAL
  Filled 2022-11-07 (×6): qty 1

## 2022-11-07 MED ORDER — ADULT MULTIVITAMIN W/MINERALS CH
1.0000 | ORAL_TABLET | Freq: Every day | ORAL | Status: DC
  Administered 2022-11-07 – 2022-11-12 (×6): 1 via ORAL
  Filled 2022-11-07 (×6): qty 1

## 2022-11-07 MED ORDER — FUROSEMIDE 10 MG/ML IJ SOLN
40.0000 mg | Freq: Two times a day (BID) | INTRAMUSCULAR | Status: DC
  Administered 2022-11-07 – 2022-11-08 (×3): 40 mg via INTRAVENOUS
  Filled 2022-11-07 (×3): qty 4

## 2022-11-07 MED ORDER — SENNOSIDES-DOCUSATE SODIUM 8.6-50 MG PO TABS
1.0000 | ORAL_TABLET | Freq: Every day | ORAL | Status: DC | PRN
  Administered 2022-11-08: 2 via ORAL
  Filled 2022-11-07: qty 2

## 2022-11-07 NOTE — Plan of Care (Signed)
  Problem: Activity: Goal: Ability to tolerate increased activity will improve Outcome: Progressing   Problem: Education: Goal: Knowledge of General Education information will improve Description: Including pain rating scale, medication(s)/side effects and non-pharmacologic comfort measures Outcome: Progressing   

## 2022-11-07 NOTE — ED Notes (Signed)
BNP added per lab

## 2022-11-07 NOTE — ED Provider Notes (Cosign Needed Addendum)
[facility] [place] [facility]ERGENCY DEPARTMENT AT [facility] Provider Note   CSN: [id number] Arrival date & time: 11/07/22  1107     History  Chief Complaint  Patient presents with   Chest Pain   Shortness of Breath    Lauren [name] is a 42 y.o. female.  HPI 42 year old female with history of PVD, DM type I, hypothyroidism, hyperlipidemia, CKD 4, unstable angina, osteomyelitis of the left foot, MCA stroke  who presents  to the ER with complaints of chest pain.  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  She has been treated heparin during those admissions, and is on Coumadin.  Patient reports being in her normal state of health until yesterday where she started to have nausea.  Nausea is not uncommon for her she says after her stroke.  She denies any abdominal pain.  Denies any dysuria or hematuria.  When I evaluated her back in April she had a similar complaint had a CT of the abdomen done which was negative.  She states that she wears a scopolamine patch for nausea.  Denies any cough, shortness of breath, fevers.  She notes chronic swelling to lower extremities, but no more than normal.  She took 2 nitroglycerin with no relief in pain.    Home Medications Prior to Admission medications   Medication Sig Start Date End Date Taking? Authorizing Provider  acetaminophen (TYLENOL) 325 MG tablet Take 1-2 tablets (325-650 mg total) by mouth every 4 (four) hours as needed for mild pain. Patient taking differently: Take 650 mg by mouth as needed for mild pain. 02/18/22  Yes Love, Evlyn Kanner, PA-C  atorvastatin (LIPITOR) 40 MG tablet Take 1 tablet (40 mg total) by mouth daily. Courtesy fill/ pt to get established with a primary MD for further refills 06/01/22 11/28/22 Yes Billie Lade, MD  clopidogrel (PLAVIX) 75 MG  tablet Take 1 tablet (75 mg total) by mouth daily. 04/29/22  Yes Micki Riley, MD  furosemide (LASIX) 40 MG tablet Take 40 mg daily as needed for swelling 03/05/22  Yes Branch, Dorothe Pea, MD  insulin aspart (NOVOLOG) 100 UNIT/ML injection Use with Omnipod for daily dose around 100 units daily Patient taking differently: Inject 0-100 Units into the skin See admin instructions. Use with Omnipod 10/18/22  Yes Dani Gobble, NP  levothyroxine (SYNTHROID) 125 MCG tablet Take 1 tablet (125 mcg total) by mouth daily at 6 (six) AM. 06/17/22 06/12/23 Yes Reardon, Freddi Starr, NP  lidocaine (HM LIDOCAINE PATCH) 4 % Place 1 patch onto the skin daily. Patient taking differently: Place 1 patch onto the skin daily as needed (Pain). 10/12/22  Yes Billie Lade, MD  metoprolol succinate (TOPROL-XL) 100 MG 24 hr tablet Take 1 tablet (100 mg total) by mouth daily. Take with or immediately following a meal. 08/06/22  Yes Willeen Niece, MD  Multiple Vitamin (MULTIVITAMIN WITH MINERALS) TABS tablet Take 1 tablet by mouth daily. 02/18/22  Yes Love, Evlyn Kanner, PA-C  nitroGLYCERIN (NITROSTAT) 0.4 MG SL tablet Place 1 tablet (0.4 mg total) under the tongue every 5 (five) minutes x 3 doses as needed for chest pain. 02/18/22  Yes Love, Evlyn Kanner, PA-C  pantoprazole (PROTONIX) 40 MG tablet Take 1 tablet (40 mg total) by mouth daily. Courtesy fill/ pt to get established with a primary MD for  further refills Patient taking differently: Take 40 mg by mouth daily as needed (reflux). 06/01/22 11/28/22 Yes Billie Lade, MD  ranolazine (RANEXA) 1000 MG SR tablet Take 1 tablet (1,000 mg total) by mouth 2 (two) times daily. 10/20/22  Yes Billie Lade, MD  senna-docusate (SENOKOT-S) 8.6-50 MG tablet Take 1-2 tablets by mouth daily as needed for mild constipation. 08/11/22  Yes Gardenia Phlegm, MD  warfarin (COUMADIN) 5 MG tablet TAKE ONE TABLET BY MOUTH DAILY EXCEPT 1 AND 1/2 TABLET ON WEDNESDAYS AND SATURDAYS OR AS DIRECTED Patient  taking differently: Take 5 mg by mouth every evening. 06/15/22  Yes Branch, Dorothe Pea, MD  hydrALAZINE (APRESOLINE) 25 MG tablet Take 1 tablet (25 mg total) by mouth every 8 (eight) hours. 11/12/22   Noralee Stain, DO  Insulin Disposable Pump (OMNIPOD 5 G6 PODS, GEN 5,) MISC Change pod every 48-72 hours. 11/11/22   Dani Gobble, NP  isosorbide mononitrate (IMDUR) 60 MG 24 hr tablet Take 2 tablets (120 mg total) by mouth daily. 11/13/22   Noralee Stain, DO  oxyCODONE-acetaminophen (PERCOCET) 7.5-325 MG tablet Take 1 tablet by mouth daily as needed for severe pain. 11/15/22   Fanny Dance, MD  scopolamine (TRANSDERM-SCOP) 1 MG/3DAYS Place 1 patch (1.5 mg total) onto the skin every 3 (three) days. 11/17/22   Gardenia Phlegm, MD  tiZANidine (ZANAFLEX) 4 MG tablet TAKE (1) TABLET BY MOUTH AT BEDTIME. 11/08/22   Billie Lade, MD  topiramate (TOPAMAX) 25 MG tablet Take 0.5-1 tablets (12.5-25 mg total) by mouth at bedtime. 11/22/22 11/22/23  Fanny Dance, MD  traZODone (DESYREL) 100 MG tablet Take 1 tablet (100 mg total) by mouth at bedtime. 11/17/22   Gardenia Phlegm, MD      Allergies    Visipaque [iodixanol], Wellbutrin [bupropion], Gabapentin, Cefepime, Ciprofloxacin hcl, and Tape    Review of Systems   Review of Systems Ten systems reviewed and are negative for acute change, except as noted in the HPI.   Physical Exam Updated Vital Signs BP 124/71 (BP Location: Right Arm)   Pulse 86   Temp 97.6 F (36.4 C) (Oral)   Resp 16   Ht 5\' 5"  (1.651 m)   Wt 105 kg   LMP 11/05/2022 (Approximate)   SpO2 99%   BMI 38.52 kg/m  Physical Exam Vitals reviewed.  Constitutional:      Appearance: Normal appearance.  HENT:     Head: Normocephalic.  Cardiovascular:     Rate and Rhythm: Normal rate and regular rhythm.     Pulses: Normal pulses.     Heart sounds: Normal heart sounds, S1 normal and S2 normal. No murmur heard.    No friction rub.  Pulmonary:     Effort: Pulmonary effort is  normal.     Breath sounds: Normal breath sounds.  Abdominal:     General: Abdomen is flat.     Palpations: Abdomen is soft.  Musculoskeletal:     Right lower leg: Edema present.     Left lower leg: Edema present.     Comments: 1+ pitting edema to lower extremities bilaterally  Neurological:     Mental Status: She is alert.     ED Results / Procedures / Treatments   Labs (all labs ordered are listed, but only abnormal results are displayed) Labs Reviewed  BASIC METABOLIC PANEL - Abnormal; Notable for the following components:      Result Value   Glucose, Bld 183 (*)    BUN  31 (*)    Creatinine, Ser 2.76 (*)    Calcium 8.7 (*)    GFR, Estimated 22 (*)    All other components within normal limits  CBC - Abnormal; Notable for the following components:   Hemoglobin 10.2 (*)    HCT 33.9 (*)    MCH 25.6 (*)    RDW 17.9 (*)    Platelets 446 (*)    All other components within normal limits  PROTIME-INR - Abnormal; Notable for the following components:   Prothrombin Time 32.1 (*)    INR 3.1 (*)    All other components within normal limits  BRAIN NATRIURETIC PEPTIDE - Abnormal; Notable for the following components:   B Natriuretic Peptide 1,973.2 (*)    All other components within normal limits  PROTIME-INR - Abnormal; Notable for the following components:   Prothrombin Time 29.7 (*)    INR 2.8 (*)    All other components within normal limits  CBC - Abnormal; Notable for the following components:   WBC 10.9 (*)    RBC 3.84 (*)    Hemoglobin 10.3 (*)    HCT 32.2 (*)    RDW 18.2 (*)    Platelets 420 (*)    All other components within normal limits  COMPREHENSIVE METABOLIC PANEL - Abnormal; Notable for the following components:   Sodium 134 (*)    CO2 21 (*)    Glucose, Bld 220 (*)    BUN 30 (*)    Creatinine, Ser 2.83 (*)    Calcium 8.5 (*)    Total Protein 6.1 (*)    Albumin 2.8 (*)    AST 92 (*)    ALT 59 (*)    GFR, Estimated 21 (*)    All other components within  normal limits  GLUCOSE, CAPILLARY - Abnormal; Notable for the following components:   Glucose-Capillary 227 (*)    All other components within normal limits  GLUCOSE, CAPILLARY - Abnormal; Notable for the following components:   Glucose-Capillary 112 (*)    All other components within normal limits  GLUCOSE, CAPILLARY - Abnormal; Notable for the following components:   Glucose-Capillary 166 (*)    All other components within normal limits  GLUCOSE, CAPILLARY - Abnormal; Notable for the following components:   Glucose-Capillary 151 (*)    All other components within normal limits  GLUCOSE, CAPILLARY - Abnormal; Notable for the following components:   Glucose-Capillary 291 (*)    All other components within normal limits  PROTIME-INR - Abnormal; Notable for the following components:   Prothrombin Time 29.9 (*)    INR 2.8 (*)    All other components within normal limits  BASIC METABOLIC PANEL - Abnormal; Notable for the following components:   Sodium 134 (*)    Glucose, Bld 267 (*)    BUN 38 (*)    Creatinine, Ser 3.22 (*)    Calcium 8.2 (*)    GFR, Estimated 18 (*)    All other components within normal limits  GLUCOSE, CAPILLARY - Abnormal; Notable for the following components:   Glucose-Capillary 186 (*)    All other components within normal limits  GLUCOSE, CAPILLARY - Abnormal; Notable for the following components:   Glucose-Capillary 266 (*)    All other components within normal limits  GLUCOSE, CAPILLARY - Abnormal; Notable for the following components:   Glucose-Capillary 256 (*)    All other components within normal limits  GLUCOSE, CAPILLARY - Abnormal; Notable for the following components:  Glucose-Capillary 219 (*)    All other components within normal limits  GLUCOSE, CAPILLARY - Abnormal; Notable for the following components:   Glucose-Capillary 561 (*)    All other components within normal limits  PROTIME-INR - Abnormal; Notable for the following components:    Prothrombin Time 26.4 (*)    INR 2.4 (*)    All other components within normal limits  BASIC METABOLIC PANEL - Abnormal; Notable for the following components:   Sodium 129 (*)    Glucose, Bld 394 (*)    BUN 46 (*)    Creatinine, Ser 3.21 (*)    Calcium 8.1 (*)    GFR, Estimated 18 (*)    All other components within normal limits  CBC - Abnormal; Notable for the following components:   RBC 3.42 (*)    Hemoglobin 8.9 (*)    HCT 28.8 (*)    RDW 18.0 (*)    All other components within normal limits  GLUCOSE, CAPILLARY - Abnormal; Notable for the following components:   Glucose-Capillary 436 (*)    All other components within normal limits  GLUCOSE, CAPILLARY - Abnormal; Notable for the following components:   Glucose-Capillary 240 (*)    All other components within normal limits  GLUCOSE, CAPILLARY - Abnormal; Notable for the following components:   Glucose-Capillary 282 (*)    All other components within normal limits  GLUCOSE, CAPILLARY - Abnormal; Notable for the following components:   Glucose-Capillary 185 (*)    All other components within normal limits  GLUCOSE, CAPILLARY - Abnormal; Notable for the following components:   Glucose-Capillary 60 (*)    All other components within normal limits  PROTIME-INR - Abnormal; Notable for the following components:   Prothrombin Time 22.7 (*)    INR 2.0 (*)    All other components within normal limits  BASIC METABOLIC PANEL - Abnormal; Notable for the following components:   Sodium 132 (*)    CO2 21 (*)    Glucose, Bld 315 (*)    BUN 51 (*)    Creatinine, Ser 3.11 (*)    Calcium 8.3 (*)    GFR, Estimated 19 (*)    All other components within normal limits  GLUCOSE, CAPILLARY - Abnormal; Notable for the following components:   Glucose-Capillary 129 (*)    All other components within normal limits  GLUCOSE, CAPILLARY - Abnormal; Notable for the following components:   Glucose-Capillary 368 (*)    All other components within  normal limits  GLUCOSE, CAPILLARY - Abnormal; Notable for the following components:   Glucose-Capillary 386 (*)    All other components within normal limits  GLUCOSE, CAPILLARY - Abnormal; Notable for the following components:   Glucose-Capillary 175 (*)    All other components within normal limits  GLUCOSE, CAPILLARY - Abnormal; Notable for the following components:   Glucose-Capillary 120 (*)    All other components within normal limits  PROTIME-INR - Abnormal; Notable for the following components:   Prothrombin Time 24.2 (*)    INR 2.2 (*)    All other components within normal limits  BASIC METABOLIC PANEL - Abnormal; Notable for the following components:   Sodium 134 (*)    CO2 18 (*)    Glucose, Bld 328 (*)    BUN 50 (*)    Creatinine, Ser 3.07 (*)    Calcium 8.4 (*)    GFR, Estimated 19 (*)    All other components within normal limits  GLUCOSE, CAPILLARY - Abnormal;  Notable for the following components:   Glucose-Capillary 163 (*)    All other components within normal limits  GLUCOSE, CAPILLARY - Abnormal; Notable for the following components:   Glucose-Capillary 420 (*)    All other components within normal limits  GLUCOSE, CAPILLARY - Abnormal; Notable for the following components:   Glucose-Capillary 383 (*)    All other components within normal limits  GLUCOSE, CAPILLARY - Abnormal; Notable for the following components:   Glucose-Capillary 352 (*)    All other components within normal limits  GLUCOSE, CAPILLARY - Abnormal; Notable for the following components:   Glucose-Capillary 179 (*)    All other components within normal limits  TROPONIN I (HIGH SENSITIVITY) - Abnormal; Notable for the following components:   Troponin I (High Sensitivity) 923 (*)    All other components within normal limits  TROPONIN I (HIGH SENSITIVITY) - Abnormal; Notable for the following components:   Troponin I (High Sensitivity) 990 (*)    All other components within normal limits   TROPONIN I (HIGH SENSITIVITY) - Abnormal; Notable for the following components:   Troponin I (High Sensitivity) 961 (*)    All other components within normal limits  TROPONIN I (HIGH SENSITIVITY) - Abnormal; Notable for the following components:   Troponin I (High Sensitivity) 856 (*)    All other components within normal limits  HCG, SERUM, QUALITATIVE  GLUCOSE, CAPILLARY  MAGNESIUM    EKG EKG Interpretation Date/Time:  Sunday November 07 2022 11:20:47 EDT Ventricular Rate:  82 PR Interval:  156 QRS Duration:  96 QT Interval:  400 QTC Calculation: 467 R Axis:   93  Text Interpretation: Normal sinus rhythm Right atrial enlargement Rightward axis Anterior infarct , age undetermined Abnormal ECG When compared with ECG of 03-Aug-2022 19:01, PREVIOUS ECG IS PRESENT No significant change was found Confirmed by Drema Pry [phone]) on 11/08/2022 6:13:02 PM  Radiology No results found.  Procedures .Critical Care  Performed by: Mare Ferrari, PA-C Authorized by: Mare Ferrari, PA-C   Critical care provider statement:    Critical care time (minutes):  30   Critical care was time spent personally by me on the following activities:  Development of treatment plan with patient or surrogate, discussions with consultants, evaluation of patient's response to treatment, examination of patient, ordering and review of laboratory studies, ordering and review of radiographic studies, ordering and performing treatments and interventions, pulse oximetry, re-evaluation of patient's condition and review of old charts     Medications Ordered in ED Medications  0.9 %  sodium chloride infusion (has no administration in time range)  0.9 %  sodium chloride infusion (0 mLs Intravenous Stopped 11/12/22 0507)  morphine (PF) 2 MG/ML injection 2 mg (2 mg Intravenous Given 11/07/22 1437)  warfarin (COUMADIN) tablet 5 mg (5 mg Oral Given 11/08/22 1027)  warfarin (COUMADIN) tablet 2.5 mg (2.5 mg Oral Given 11/09/22  1518)  insulin aspart (novoLOG) injection 10 Units (10 Units Subcutaneous Given 11/09/22 2227)  furosemide (LASIX) injection 20 mg (20 mg Intravenous Given 11/11/22 2306)  insulin aspart (novoLOG) injection 4 Units (4 Units Subcutaneous Given 11/12/22 2956)    ED Course/ Medical Decision Making/ A&P                             Medical Decision Making Amount and/or Complexity of Data Reviewed Labs: ordered. Radiology: ordered.  Risk Prescription drug management. Decision regarding hospitalization.   42 year old female presents to the  ER with complaints of chest pain.  History of three-vessel disease not amenable to intervention, multiple admissions for NSTEMI versus demand ischemia.  She is presenting today with similar complaints of chest pain and nausea.  Vitals are overall reassuring.  DDx is broad but includes ACS, PE, pneumonia, acute abdomen, pericarditis, myocarditis  Labs ordered, reviewed, interpreted by me, CBC with a stable normocytic anemia.  INR therapeutic, BMP with a slightly elevated creatinine from prior but not consistent with AKI.  No significant lecture abnormalities noted.  Initial troponin 923.  EKG reviewed, no significant changes noted.  Chest x-ray ordered, reviewed, agree with radiology read, negative for acute findings  Patient had 2 nitroglycerin and is still persistently complaining of chest pain.  Spoke with cardiology who will come evaluate her, requesting initiation of heparin infusion and admission to the hospitalist given her comorbidities.  Consulted hospitalist for admission.  Hospitalist will admit. Patient updated  Final Clinical Impression(s) / ED Diagnoses Final diagnoses:  NSTEMI (non-ST elevated myocardial infarction) (HCC)    Rx / DC Orders ED Discharge Orders          Ordered    isosorbide mononitrate (IMDUR) 60 MG 24 hr tablet  Daily       Note to Pharmacy: ok to change to 60mg  tabs, dose of 2 tabs, #60 per MD/maf   11/12/22 1044     hydrALAZINE (APRESOLINE) 25 MG tablet  Every 8 hours        11/12/22 1044    Increase activity slowly        11/12/22 1044    Diet - low sodium heart healthy        11/12/22 1044    Discharge instructions       Comments: You were cared for by a hospitalist during your hospital stay. If you have any questions about your discharge medications or the care you received while you were in the hospital after you are discharged, you can call the unit and ask to speak with the hospitalist on call if the hospitalist that took care of you is not available. Once you are discharged, your primary care physician will handle any further medical issues. Please note that NO REFILLS for any discharge medications will be authorized once you are discharged, as it is imperative that you return to your primary care physician (or establish a relationship with a primary care physician if you do not have one) for your aftercare needs so that they can reassess your need for medications and monitor your lab values.   11/12/22 1044    Diet Carb Modified        11/12/22 1044    (HEART FAILURE PATIENTS) Call MD:  Anytime you have any of the following symptoms: 1) 3 pound weight gain in 24 hours or 5 pounds in 1 week 2) shortness of breath, with or without a dry hacking cough 3) swelling in the hands, feet or stomach 4) if you have to sleep on extra pillows at night in order to breathe.        11/12/22 1044    Call MD for:  extreme fatigue        11/12/22 1044    Call MD for:  persistant dizziness or light-headedness        11/12/22 1044    Call MD for:  hives        11/12/22 1044    Call MD for:  difficulty breathing, headache or visual disturbances  11/12/22 1044    Call MD for:  severe uncontrolled pain        11/12/22 1044    Call MD for:  persistant nausea and vomiting        11/12/22 1044    Call MD for:  temperature >100.4        11/12/22 1044              Leone Brand 11/07/22 1337     Linwood Dibbles, MD 11/10/22 0658    Mare Ferrari, PA-C 12/05/22 Phineas Douglas    Linwood Dibbles, MD 12/06/22 [phone]

## 2022-11-07 NOTE — H&P (Addendum)
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  She states that she typically develops profound nausea every 3 days as her scopolamine patch is wearing off but today her nausea was associated with substernal chest pressure which is unusual for her.  She denies any recent chest pain or pressure when ambulating.  Her chest pain was also associated with shortness of breath.  She used nitroglycerin at home but did not experience any relief of her symptoms.  Her pain resolved spontaneously just prior to her presentation to the ER.  Assessment/Plan  Known multivessel CAD -recurrent USAP Cardiac cath 07/26/2022 noted the following:       Ost LAD to Prox LAD lesion is 75% stenosed.   Prox LAD to Mid LAD lesion is 100% stenosed.   Prox Cx to Dist Cx lesion is 35% stenosed with 35% stenosed side branch in LPAV.   Prox RCA lesion is 95% stenosed.   Mid RCA lesion is 95% stenosed.   Dist RCA lesion is 100% stenosed. The patient has previously been deemed an inappropriate candidate for PCI or CABG with medical therapy being the only option - given elevated troponin IV heparin has been initiated - Cardiology to evaluate -already on Plavix, statin, beta-blocker, and Ranexa at time of admission  Chronic systolic CHF No gross volume overload on exam at time of admission -continue usual home medical  therapy  History of suspected thrombotic CVA and right upper extremity brachial artery embolus 2023 On chronic warfarin therapy -scheduled to undergo hematologic evaluation at [facility] to determine if long-term anticoagulation appropriate  PVD Status post angioplasty with stenting of the left SFA February 2024  HTN Resume usual home medications and adjust therapy as indicated for more strict blood pressure control  History of CVA with chronic left hemiparesis Clinically stable on exam  DM2 Utilizes insulin pump at home -allow patient to continue using her insulin pump during this hospital stay  HLD Continue usual medical therapy  CKD stage IV Creatinine appears to be stable at her baseline  Obesity - Body mass index is 37.24 kg/m.   DVT prophylaxis: IV heparin Code Status:   Code Status: DNR as per specified on MOST Form  Family Communication: No family present at time of exam Disposition Plan:  Admit to Inpatient   Review of Systems: As per HPI otherwise 10 point review of systems negative.   Past Medical History:  Diagnosis Date   Anemia    CAD (coronary artery disease)    a. s/p cath in 03/2014 showing 30% mid-LAD, moderate to severe disease along small D1, patent LCx, moderate to severe distal OM2 stenosis and moderate diffuse diease along RCA not amenable to PCI   CHF (congestive heart failure) (HCC)    a. EF 55-60% in 12/2019 b. EF at 35-40% by echo in 05/2020   CKD (chronic kidney disease) stage 4, GFR 15-29 ml/min (HCC)    Diabetes mellitus without complication (HCC)  Myocardial infarction [facility])    Neuropathy    PVD (peripheral vascular disease) (HCC)    Stroke (HCC) 01/2022   L hand weakness    Past Surgical History:  Procedure Laterality Date   ABDOMINAL AORTOGRAM W/LOWER EXTREMITY N/A 06/10/2022   Procedure: ABDOMINAL AORTOGRAM W/LOWER EXTREMITY;  Surgeon: Cephus Shelling, MD;  Location: [facility] INVASIVE CV LAB;  Service: Cardiovascular;  Laterality: N/A;    AMPUTATION Left 09/02/2021   Procedure: AMPUTATION RAY;  Surgeon: Edwin Cap, DPM;  Location: [facility] OR;  Service: Podiatry;  Laterality: Left;  sagittal saw, 3L bag saline & Pulse   Cardiac catherization     CHOLECYSTECTOMY     I & D EXTREMITY Left 08/30/2021   Procedure: IRRIGATION AND DEBRIDEMENT WITH BONE BIOPSY;  Surgeon: Candelaria Stagers, DPM;  Location: [facility] OR;  Service: Podiatry;  Laterality: Left;   IR ANGIO VERTEBRAL SEL SUBCLAVIAN INNOMINATE UNI L MOD SED  01/18/2022   IR CT HEAD LTD  01/08/2022   IR INTRA CRAN STENT  01/08/2022   IR PERCUTANEOUS ART THROMBECTOMY/INFUSION INTRACRANIAL INC DIAG ANGIO  01/08/2022   IRRIGATION AND DEBRIDEMENT FOOT Left 09/04/2021   Procedure: IRRIGATION AND DEBRIDEMENT FOOT AND CLOSURE;  Surgeon: Edwin Cap, DPM;  Location: [facility] OR;  Service: Podiatry;  Laterality: Left;   LEFT HEART CATH AND CORONARY ANGIOGRAPHY N/A 07/26/2022   Procedure: LEFT HEART CATH AND CORONARY ANGIOGRAPHY;  Surgeon: [place], Peter M, MD;  Location: [facility] INVASIVE CV LAB;  Service: Cardiovascular;  Laterality: N/A;   METATARSAL OSTEOTOMY Left 07/21/2022   Procedure: LEFT FOOT EXCISION OF FIFTH METATARSAL WITH DEBRIDEMENT OF ULCER, POSSIBLE BONE BIOPSY OF THE FOURTH METATARSAL, POSSIBLE FOURTH RAY AMPUTATION OF LEFT TOEAND METATARSAL;  Surgeon: Vivi Barrack, DPM;  Location: [facility] OR;  Service: Podiatry;  Laterality: Left;   PERIPHERAL VASCULAR BALLOON ANGIOPLASTY Left 06/10/2022   Procedure: PERIPHERAL VASCULAR BALLOON ANGIOPLASTY;  Surgeon: Cephus Shelling, MD;  Location: [facility] INVASIVE CV LAB;  Service: Cardiovascular;  Laterality: Left;  AT and DP   PERIPHERAL VASCULAR INTERVENTION Left 06/10/2022   Procedure: PERIPHERAL VASCULAR INTERVENTION;  Surgeon: Cephus Shelling, MD;  Location: [facility] INVASIVE CV LAB;  Service: Cardiovascular;  Laterality: Left;  SFA   RADIOLOGY WITH ANESTHESIA N/A 01/08/2022   Procedure: IR WITH ANESTHESIA;  Surgeon: Radiologist, Medication, MD;  Location: [facility] OR;   Service: Radiology;  Laterality: N/A;   THROMBECTOMY BRACHIAL ARTERY Right 11/18/2021   Procedure: RIGHT BRACHIAL, RADIAL, & ULNAR ARTERY THROMBECTOMY.;  Surgeon: Cephus Shelling, MD;  Location: [facility] OR;  Service: Vascular;  Laterality: Right;   TUBAL LIGATION      Family History  Family History  Problem Relation Age of Onset   Thyroid disease Mother    Hypertension Mother    Hyperlipidemia Mother    CAD Mother    CVA Mother    Hyperlipidemia Father    Hypertension Father    CAD Father    Breast cancer Paternal Grandmother 60   Cancer Paternal Grandmother        lung    Social History   reports that she quit smoking about 10 months ago. Her smoking use included cigarettes. She has a 15.00 pack-year smoking history. She has been exposed to tobacco smoke. She has never used smokeless tobacco. She reports current alcohol use. She reports that she does not use drugs.  Allergies Allergies  Allergen Reactions   Visipaque [Iodixanol] Nausea And Vomiting    Patient had vagal response during procedure became  hypertensive, and bradycardic. CO2 used during procedure prior to contrast being used, this may be cause as well. Recommended premedicating prior to contrast being used in the future.    Wellbutrin [Bupropion] Hives   Gabapentin     dizzy   Cefepime Rash    Tolerates penicilllin   Ciprofloxacin Hcl Hives and Rash    Hives/rash at injection site; 01/15/22 tolerated IV cipro   Tape Rash    Prior to Admission medications   Medication Sig Start Date End Date Taking? Authorizing Provider  acetaminophen (TYLENOL) 325 MG tablet Take 1-2 tablets (325-650 mg total) by mouth every 4 (four) hours as needed for mild pain. Patient taking differently: Take 650 mg by mouth as needed for mild pain. 02/18/22   Love, Evlyn Kanner, PA-C  atorvastatin (LIPITOR) 40 MG tablet Take 1 tablet (40 mg total) by mouth daily. Courtesy fill/ pt to get established with a primary MD for further refills 06/01/22  11/28/22  Billie Lade, MD  clopidogrel (PLAVIX) 75 MG tablet Take 1 tablet (75 mg total) by mouth daily. 04/29/22   Micki Riley, MD  Continuous Blood Gluc Sensor (DEXCOM G6 SENSOR) MISC Change sensor every 10 days as directed 09/29/21   Dani Gobble, NP  diclofenac Sodium (VOLTAREN ARTHRITIS PAIN) 1 % GEL Apply 2 g topically 4 (four) times daily. Patient taking differently: Apply 1 Application topically 2 (two) times daily as needed (pain). 04/19/22   Fanny Dance, MD  furosemide (LASIX) 40 MG tablet Take 40 mg daily as needed for swelling 03/05/22   Antoine Poche, MD  gabapentin (NEURONTIN) 100 MG capsule Take 1 capsule (100 mg total) by mouth 3 (three) times daily. Start with 1 cap at night for 3 days, then increase to twice a day for 3 days, then take 3 times a day 09/21/22   Fanny Dance, MD  hydrALAZINE (APRESOLINE) 25 MG tablet Take 1 tablet (25 mg total) by mouth in the morning and at bedtime. 09/21/22 12/20/22  Antoine Poche, MD  insulin aspart (NOVOLOG) 100 UNIT/ML injection Use with Omnipod for daily dose around 100 units daily 10/18/22   Dani Gobble, NP  Insulin Disposable Pump (OMNIPOD 5 G6 PODS, GEN 5,) MISC Change pod every 48-72 hours 06/17/22   Dani Gobble, NP  Insulin Disposable Pump (OMNIPOD 5 G6 PODS, GEN 5,) MISC Change pod every 48-72 hours. 10/18/22   Dani Gobble, NP  isosorbide mononitrate (ISMO) 20 MG tablet Take 2 tablets (40 mg total) by mouth 2 (two) times daily. 10/04/22   Billie Lade, MD  levETIRAcetam (KEPPRA) 500 MG tablet Take by mouth. 10/25/22 11/24/22  [provider]  levothyroxine (SYNTHROID) 125 MCG tablet Take 1 tablet (125 mcg total) by mouth daily at 6 (six) AM. 06/17/22 06/12/23  Dani Gobble, NP  lidocaine (HM LIDOCAINE PATCH) 4 % Place 1 patch onto the skin daily. 10/12/22   Billie Lade, MD  metoprolol succinate (TOPROL-XL) 100 MG 24 hr tablet Take 1 tablet (100 mg total) by mouth daily. Take  with or immediately following a meal. 08/06/22   Willeen Niece, MD  Multiple Vitamin (MULTIVITAMIN WITH MINERALS) TABS tablet Take 1 tablet by mouth daily. 02/18/22   Love, Evlyn Kanner, PA-C  nitroGLYCERIN (NITROSTAT) 0.4 MG SL tablet Place 1 tablet (0.4 mg total) under the tongue every 5 (five) minutes x 3 doses as needed for chest pain. 02/18/22   Love, Evlyn Kanner, PA-C  oxyCODONE-acetaminophen (PERCOCET) 7.5-325 MG tablet  Take 1 tablet by mouth daily as needed for severe pain. 10/15/22   Fanny Dance, MD  pantoprazole (PROTONIX) 40 MG tablet Take 1 tablet (40 mg total) by mouth daily. Courtesy fill/ pt to get established with a primary MD for further refills Patient taking differently: Take 40 mg by mouth daily as needed (reflux). 06/01/22 11/28/22  Billie Lade, MD  polyethylene glycol (MIRALAX / GLYCOLAX) 17 g packet Take 17 g by mouth 2 (two) times daily. Patient taking differently: Take 17 g by mouth as needed for moderate constipation. 03/03/22   Jones Bales, NP  ranolazine (RANEXA) 1000 MG SR tablet Take 1 tablet (1,000 mg total) by mouth 2 (two) times daily. 10/20/22   Billie Lade, MD  SANTYL 250 UNIT/GM ointment Apply 1 Application topically daily. 05/31/22   [provider]  scopolamine (TRANSDERM-SCOP) 1 MG/3DAYS Place 1 patch (1.5 mg total) onto the skin every 3 (three) days. 09/08/22   Billie Lade, MD  senna-docusate (SENOKOT-S) 8.6-50 MG tablet Take 1-2 tablets by mouth daily as needed for mild constipation. 08/11/22   Gardenia Phlegm, MD  tiZANidine (ZANAFLEX) 4 MG tablet TAKE (1) TABLET BY MOUTH AT BEDTIME. 09/29/22   Billie Lade, MD  traZODone (DESYREL) 100 MG tablet Take 1 tablet (100 mg total) by mouth at bedtime. 07/28/22   Billie Lade, MD  warfarin (COUMADIN) 5 MG tablet TAKE ONE TABLET BY MOUTH DAILY EXCEPT 1 AND 1/2 TABLET ON WEDNESDAYS AND SATURDAYS OR AS DIRECTED Patient taking differently: Take 5-7.5 mg by mouth every evening. TAKE ONE TABLET BY  MOUTH DAILY EXCEPT 1 AND 1/2 TABLET ON [facility] AND SATURDAYS OR AS DIRECTED 06/15/22   Antoine Poche, MD    Physical Exam: Vitals:   11/07/22 1115 11/07/22 1117  BP: (!) 154/92   Pulse: 83   Resp: 18   Temp: 98.7 F (37.1 C)   SpO2: 98%   Weight:  101.5 kg  Height:  5\' 5"  (1.651 m)    Constitutional: NAD, calm, comfortable Eyes: PERRL, lids and conjunctivae normal ENMT: Mucous membranes are moist. Posterior pharynx clear of any exudate or lesions. Normal dentition.  Respiratory: clear to auscultation bilaterally, no wheezing, no crackles. Normal respiratory effort. No accessory muscle use.  Cardiovascular: Regular rate and rhythm, no murmurs / rubs / gallops. Trace B LE edema  Abdomen: No tenderness or masses to palpation. No hepatosplenomegaly. Bowel sounds positive. Not distended. Soft.  Musculoskeletal: No clubbing / cyanosis. No joint deformity upper and lower extremities. No contractures. Normal muscle tone.  Skin: No rashes, lesions, ulcers.  Neurologic: CN 2-12 grossly intact B. Sensation intact. Strength 5/5 in RU&LE - 4+/5 in LU&LE.    Labs on Admission:   CBC: Recent Labs  Lab 11/07/22 1129  WBC 10.2  HGB 10.2*  HCT 33.9*  MCV 85.2  PLT 446*   Basic Metabolic Panel: Recent Labs  Lab 11/07/22 1129  NA 138  K 4.6  CL 103  CO2 24  GLUCOSE 183*  BUN 31*  CREATININE 2.76*  CALCIUM 8.7*   GFR: Estimated Creatinine Clearance: 32 mL/min (A) (by C-G formula based on SCr of 2.76 mg/dL (H)).  Recent Labs  Lab 11/07/22 1129  INR 3.1*   Radiological Exams on Admission: DG Chest 2 View Result Date: 11/07/2022 IMPRESSION: No active cardiopulmonary disease.  EKG: Independently reviewed. No acute ST/T changes per my review.   Lonia Blood, MD [facility] Office  [phone] Pager - Text Page  per Amion as per below:  On-Call/Text Page:      [email or web address]  If 7PM-7AM, please contact night-coverage [email or web address] 11/07/2022, 1:33  PM

## 2022-11-07 NOTE — Progress Notes (Addendum)
ANTICOAGULATION CONSULT NOTE - Initial Consult  Pharmacy Consult for Heparin Indication: chest pain/ACS  Allergies  Allergen Reactions   Visipaque [Iodixanol] Nausea And Vomiting    Patient had vagal response during procedure became hypertensive, and bradycardic. CO2 used during procedure prior to contrast being used, this may be cause as well. Recommended premedicating prior to contrast being used in the future.    Wellbutrin [Bupropion] Hives   Gabapentin     dizzy   Cefepime Rash    Tolerates penicilllin   Ciprofloxacin Hcl Hives and Rash    Hives/rash at injection site; 01/15/22 tolerated IV cipro   Tape Rash    Patient Measurements: Height: 5\' 5"  (165.1 cm) Weight: 101.5 kg (223 lb 12.3 oz) IBW/kg (Calculated) : 57 Heparin Dosing Weight: 80.3 kg  Vital Signs: Temp: 98.7 F (37.1 C) (07/07 1115) BP: 154/92 (07/07 1115) Pulse Rate: 83 (07/07 1115)  Labs: Recent Labs    11/07/22 1129  HGB 10.2*  HCT 33.9*  PLT 446*  LABPROT 32.1*  INR 3.1*  CREATININE 2.76*  TROPONINIHS 923*    Estimated Creatinine Clearance: 32 mL/min (A) (by C-G formula based on SCr of 2.76 mg/dL (H)).   Medical History: Past Medical History:  Diagnosis Date   Anemia    CAD (coronary artery disease)    a. s/p cath in 03/2014 showing 30% mid-LAD, moderate to severe disease along small D1, patent LCx, moderate to severe distal OM2 stenosis and moderate diffuse diease along RCA not amenable to PCI   CHF (congestive heart failure) (HCC)    a. EF 55-60% in 12/2019 b. EF at 35-40% by echo in 05/2020   CKD (chronic kidney disease) stage 4, GFR 15-29 ml/min (HCC)    Diabetes mellitus without complication (HCC)    Myocardial infarction (HCC)    Neuropathy    PVD (peripheral vascular disease) (HCC)    Stroke (HCC) 01/2022   L hand weakness    Assessment: 42 yof with a history of PVD, T1DM, hypothyroidism, CKD, unstable angina, osteomyelitis of the left foot, MCA stroke. Patient is presenting  with chest pain. Heparin per pharmacy consult placed for chest pain/ACS.  Patient is on warfarin prior to arrival for stroke. Home dose is 5mg  daily per last anticoag visit note. Last dose 7/6 pm.  Hgb 10.2; plt 446 INR 3.1  Goal of Therapy:  Heparin level 0.3-0.7 units/ml Monitor platelets by anticoagulation protocol: Yes   Plan:  Start heparin infusion when INR < 2 Continue to monitor H&H and platelets  Delmar Landau, PharmD, BCPS 11/07/2022 1:13 PM ED Clinical Pharmacist -  [phone]

## 2022-11-07 NOTE — ED Triage Notes (Signed)
Pt complains of chest pain, SOB, nausea and vomiting that started this morning. Nausea and vomiting started around 845am. Pt took nitroglycerin at 905 and 0910 with no relief of pain.

## 2022-11-07 NOTE — Consult Note (Addendum)
Cardiology Consultation   Patient ID: Aili Fiebelkorn MRN: [id number]; DOB: 08-22-1980  Admit date: 11/07/2022 Date of Consult: 11/07/2022  PCP:  Billie Lade, MD   [facility] Providers Cardiologist:  Dina Rich, MD   {    Patient Profile:   Lauren [name] is a 42 y.o. 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  History of Present Illness:   Lauren [name] with above past medical history presented to the ER today complaining chest pain and SOB.  Patient states she started having symptoms around 8:45 AM this morning.  She reports midsternal chest pain,  associated with shortness of breath at rest, nausea, vomiting. She had taken nitroglycerin, which helping the symptoms some. She felt most bothered by her SOB. She endorses orthopnea since 01/2022. She noted worsening of BLE edema as well. She takes lasix 40mg  daily, past few days she had been taking 40mg  BID.   Per chart review, patient historically follows Dr. Wyline Mood outpatient for multivessel CAD.  Initial cardiac catheterization 03/2014 with LVEF >60%, mid LAD 30%, D1 mod to severe small vessel, distally pruned, distal 50-60% disease. LCX patent, OM2, mild disease with distal mod to severe, RCA diffuse moderate disease, no target for PCI.  Stress Myoview from 02/2020 with large periapical defect consistent with scar, mild apical anterior lateral  ischemia.  Echocardiogram from 05/2020 with LVEF 35 to 40%, grade 3 DD, normal RV, mild LAE, mild MR, apex akinetic.  Due to her renal disease, repeat heart cath for evaluation of LV dysfunction had been deferred.    She was most hospitalized here March 2024 for non-STEMI, in the setting of diabetic foot osteomyelitis requiring left fifth metatarsal excision.  She underwent cardiac catheterization 07/26/2022 revealing ostial LAD 75%, prox to mid occluded LAD CTO. LCX 35%, RCA prox to mid 95%, distal occluded.  Mildly elevated LVEDP 12 mmHg.  There was no target for revascularization, she was recommended medical management.  Echocardiogram 07/24/2022 with LVEF 30 to 35%, mildly reduced RV, mild elevated PASP 37.1 mmHg, moderate LAE, mild to moderate MR.  She was maintained on Plavix, Coumadin, isosorbide, Ranexa, and metoprolol for medical therapy.  She shortly returned to the hospital 08/02/2022 with recurrent chest pain, had a troponin elevated at 300-400.  Medical therapy was felt the only option.  She was treated with IV heparin and nitroglycerin infusion.  Her Imdur was increased to 40 mg twice daily at the time of discharge.  She was last seen in the office 09/08/2022 by Dr. Wyline Mood, has chronic stable shortness of breath with exertion and lower extremity edema, continue takes Lasix 40 mg daily.  She had reported no angina and was compliant with her medication.  GDMT for cardiomyopathy was felt limited due to underlying renal disease.  Given her low EF 30 to 35%, ICD was considered.  Her hydralazine was increased to 25 mg twice daily.  She suffered acute right MCA infarct and punctate left MCA/ACA infarct with right M1 occlusion 01/2022 requiring mechanical  thrombectomy with TICI 3 revascularization that was complicated by right ICA dissection which required telescope stent placement.  Stroke etiology was felt cryptogenic, large vessel disease versus cardioembolic.  Hypercoagulable workup revealed abnormal lupus  anticoagulant and increased rheumatoid factor, which was felt due to IV heparin use, and recommended repeat labs in 57-month.  She was on Eliquis before that admission for unclear reason.  During the hospitalization, course was complicated by elevated troponin peaked at 3869.  Echo revealed LVEF 30 to 35% with multiple wall motion abnormality. She was given heparin infusion.  Troponin elevation was felt due to demand ischemia, ischemic evaluation was delayed due to acute stroke, severe anemia, advanced CKD. She was ultimately switched to Lovenox and Brilinta by neurology at the time of discharge.  She saw hematology 04/19/2022, deemed no obvious hypercoagulable disorder but JAK2 panel pending, she was transitioned from Lovenox to Coumadin with INR goal 2-3. At follow-up with neurology on 04/29/2022, she was switched from Brilinta to Plavix due to cost issue.  She was maintained on Coumadin, but felt this may be stopped as there is no clear evidence of a hypercoagulable disorder or atrial fibrillation.       Past Medical History:  Diagnosis Date   Anemia    CAD (coronary artery disease)    a. s/p cath in 03/2014 showing 30% mid-LAD, moderate to severe disease along small D1, patent LCx, moderate to severe distal OM2 stenosis and moderate diffuse diease along RCA not amenable to PCI   CHF (congestive heart failure) (HCC)    a. EF 55-60% in 12/2019 b. EF at 35-40% by echo in 05/2020   CKD (chronic kidney disease) stage 4, GFR 15-29 ml/min (HCC)    Diabetes mellitus without complication (HCC)    Myocardial infarction (HCC)    Neuropathy    PVD (peripheral vascular disease) (HCC)    Stroke (HCC) 01/2022   L hand weakness    Past Surgical History:  Procedure Laterality Date   ABDOMINAL AORTOGRAM W/LOWER EXTREMITY N/A 06/10/2022   Procedure: ABDOMINAL AORTOGRAM W/LOWER EXTREMITY;  Surgeon: Cephus Shelling, MD;  Location: [facility] INVASIVE CV [facility];  Service: Cardiovascular;  Laterality: N/A;    AMPUTATION Left 09/02/2021   Procedure: AMPUTATION RAY;  Surgeon: Edwin Cap, DPM;  Location: [facility] OR;  Service: Podiatry;  Laterality: Left;  sagittal saw, 3L bag saline & Pulse   Cardiac catherization     CHOLECYSTECTOMY     I & D EXTREMITY Left 08/30/2021   Procedure: IRRIGATION AND DEBRIDEMENT WITH BONE BIOPSY;  Surgeon: Candelaria Stagers, DPM;  Location: [facility] OR;  Service: Podiatry;  Laterality: Left;   IR ANGIO VERTEBRAL SEL SUBCLAVIAN INNOMINATE UNI L MOD SED  01/18/2022   IR CT HEAD LTD  01/08/2022   IR INTRA CRAN STENT  01/08/2022   IR PERCUTANEOUS ART THROMBECTOMY/INFUSION INTRACRANIAL INC DIAG ANGIO  01/08/2022   IRRIGATION AND DEBRIDEMENT FOOT Left 09/04/2021   Procedure: IRRIGATION AND DEBRIDEMENT FOOT AND CLOSURE;  Surgeon: Edwin Cap, DPM;  Location: [facility] OR;  Service: Podiatry;  Laterality: Left;   LEFT HEART CATH AND CORONARY ANGIOGRAPHY N/A 07/26/2022   Procedure: LEFT HEART CATH AND CORONARY ANGIOGRAPHY;  Surgeon: [place], Peter M, MD;  Location: [facility] INVASIVE CV LAB;  Service: Cardiovascular;  Laterality: N/A;   METATARSAL OSTEOTOMY Left 07/21/2022   Procedure: LEFT FOOT EXCISION OF FIFTH METATARSAL WITH DEBRIDEMENT OF ULCER, POSSIBLE BONE BIOPSY OF THE FOURTH METATARSAL, POSSIBLE FOURTH RAY AMPUTATION OF LEFT TOEAND METATARSAL;  Surgeon: Ovid Curd  R, DPM;  Location: [facility] OR;  Service: Podiatry;  Laterality: Left;   PERIPHERAL VASCULAR BALLOON ANGIOPLASTY Left 06/10/2022   Procedure: PERIPHERAL VASCULAR BALLOON ANGIOPLASTY;  Surgeon: Cephus Shelling, MD;  Location: [facility];  Service: Cardiovascular;  Laterality: Left;  AT and DP   PERIPHERAL VASCULAR INTERVENTION Left 06/10/2022   Procedure: PERIPHERAL VASCULAR INTERVENTION;  Surgeon: Cephus Shelling, MD;  Location: [facility] INVASIVE CV LAB;  Service: Cardiovascular;  Laterality: Left;  SFA   RADIOLOGY WITH ANESTHESIA N/A 01/08/2022   Procedure: IR WITH ANESTHESIA;  Surgeon: Radiologist, Medication, MD;  Location: [facility] OR;   Service: Radiology;  Laterality: N/A;   THROMBECTOMY BRACHIAL ARTERY Right 11/18/2021   Procedure: RIGHT BRACHIAL, RADIAL, & ULNAR ARTERY THROMBECTOMY.;  Surgeon: Cephus Shelling, MD;  Location: [facility] OR;  Service: Vascular;  Laterality: Right;   TUBAL LIGATION       Home Medications:  Prior to Admission medications   Medication Sig Start Date End Date Taking? Authorizing Provider  acetaminophen (TYLENOL) 325 MG tablet Take 1-2 tablets (325-650 mg total) by mouth every 4 (four) hours as needed for mild pain. Patient taking differently: Take 650 mg by mouth as needed for mild pain. 02/18/22   Love, Evlyn Kanner, PA-C  atorvastatin (LIPITOR) 40 MG tablet Take 1 tablet (40 mg total) by mouth daily. Courtesy fill/ pt to get established with a primary MD for further refills 06/01/22 11/28/22  Billie Lade, MD  clopidogrel (PLAVIX) 75 MG tablet Take 1 tablet (75 mg total) by mouth daily. 04/29/22   Micki Riley, MD  Continuous Blood Gluc Sensor (DEXCOM G6 SENSOR) MISC Change sensor every 10 days as directed 09/29/21   Dani Gobble, NP  diclofenac Sodium (VOLTAREN ARTHRITIS PAIN) 1 % GEL Apply 2 g topically 4 (four) times daily. Patient taking differently: Apply 1 Application topically 2 (two) times daily as needed (pain). 04/19/22   Fanny Dance, MD  furosemide (LASIX) 40 MG tablet Take 40 mg daily as needed for swelling 03/05/22   Antoine Poche, MD  gabapentin (NEURONTIN) 100 MG capsule Take 1 capsule (100 mg total) by mouth 3 (three) times daily. Start with 1 cap at night for 3 days, then increase to twice a day for 3 days, then take 3 times a day 09/21/22   Fanny Dance, MD  hydrALAZINE (APRESOLINE) 25 MG tablet Take 1 tablet (25 mg total) by mouth in the morning and at bedtime. 09/21/22 12/20/22  Antoine Poche, MD  insulin aspart (NOVOLOG) 100 UNIT/ML injection Use with Omnipod for daily dose around 100 units daily 10/18/22   Dani Gobble, NP  Insulin Disposable Pump  (OMNIPOD 5 G6 PODS, GEN 5,) MISC Change pod every 48-72 hours 06/17/22   Dani Gobble, NP  Insulin Disposable Pump (OMNIPOD 5 G6 PODS, GEN 5,) MISC Change pod every 48-72 hours. 10/18/22   Dani Gobble, NP  isosorbide mononitrate (ISMO) 20 MG tablet Take 2 tablets (40 mg total) by mouth 2 (two) times daily. 10/04/22   Billie Lade, MD  levETIRAcetam (KEPPRA) 500 MG tablet Take by mouth. 10/25/22 11/24/22  [provider]  levothyroxine (SYNTHROID) 125 MCG tablet Take 1 tablet (125 mcg total) by mouth daily at 6 (six) AM. 06/17/22 06/12/23  Dani Gobble, NP  lidocaine (HM LIDOCAINE PATCH) 4 % Place 1 patch onto the skin daily. 10/12/22   Billie Lade, MD  metoprolol succinate (TOPROL-XL) 100 MG 24 hr tablet Take 1 tablet (100  mg total) by mouth daily. Take with or immediately following a meal. 08/06/22   Willeen Niece, MD  Multiple Vitamin (MULTIVITAMIN WITH MINERALS) TABS tablet Take 1 tablet by mouth daily. 02/18/22   Love, Evlyn Kanner, PA-C  nitroGLYCERIN (NITROSTAT) 0.4 MG SL tablet Place 1 tablet (0.4 mg total) under the tongue every 5 (five) minutes x 3 doses as needed for chest pain. 02/18/22   Love, Evlyn Kanner, PA-C  oxyCODONE-acetaminophen (PERCOCET) 7.5-325 MG tablet Take 1 tablet by mouth daily as needed for severe pain. 10/15/22   Fanny Dance, MD  pantoprazole (PROTONIX) 40 MG tablet Take 1 tablet (40 mg total) by mouth daily. Courtesy fill/ pt to get established with a primary MD for further refills Patient taking differently: Take 40 mg by mouth daily as needed (reflux). 06/01/22 11/28/22  Billie Lade, MD  polyethylene glycol (MIRALAX / GLYCOLAX) 17 g packet Take 17 g by mouth 2 (two) times daily. Patient taking differently: Take 17 g by mouth as needed for moderate constipation. 03/03/22   Jones Bales, NP  ranolazine (RANEXA) 1000 MG SR tablet Take 1 tablet (1,000 mg total) by mouth 2 (two) times daily. 10/20/22   Billie Lade, MD  SANTYL 250 UNIT/GM  ointment Apply 1 Application topically daily. 05/31/22   [provider]  scopolamine (TRANSDERM-SCOP) 1 MG/3DAYS Place 1 patch (1.5 mg total) onto the skin every 3 (three) days. 09/08/22   Billie Lade, MD  senna-docusate (SENOKOT-S) 8.6-50 MG tablet Take 1-2 tablets by mouth daily as needed for mild constipation. 08/11/22   Gardenia Phlegm, MD  tiZANidine (ZANAFLEX) 4 MG tablet TAKE (1) TABLET BY MOUTH AT BEDTIME. 09/29/22   Billie Lade, MD  traZODone (DESYREL) 100 MG tablet Take 1 tablet (100 mg total) by mouth at bedtime. 07/28/22   Billie Lade, MD  warfarin (COUMADIN) 5 MG tablet TAKE ONE TABLET BY MOUTH DAILY EXCEPT 1 AND 1/2 TABLET ON WEDNESDAYS AND SATURDAYS OR AS DIRECTED Patient taking differently: Take 5-7.5 mg by mouth every evening. TAKE ONE TABLET BY MOUTH DAILY EXCEPT 1 AND 1/2 TABLET ON [facility] AND SATURDAYS OR AS DIRECTED 06/15/22   Antoine Poche, MD    Inpatient Medications: Scheduled Meds:  Continuous Infusions:  PRN Meds:   Allergies:    Allergies  Allergen Reactions   Visipaque [Iodixanol] Nausea And Vomiting    Patient had vagal response during procedure became hypertensive, and bradycardic. CO2 used during procedure prior to contrast being used, this may be cause as well. Recommended premedicating prior to contrast being used in the future.    Wellbutrin [Bupropion] Hives   Gabapentin     dizzy   Cefepime Rash    Tolerates penicilllin   Ciprofloxacin Hcl Hives and Rash    Hives/rash at injection site; 01/15/22 tolerated IV cipro   Tape Rash    Social History:   Social History   Socioeconomic History   Marital status: Married    Spouse name: Harvel Quale   Number of children: 2   Years of education: 12   Highest education level: 12th grade  Occupational History    Comment: Full time   Occupation: CNA at [employer]    Comment: applying for disability  Tobacco Use   Smoking status: Former    Packs/day: 1.00    Years: 15.00     Additional pack years: 0.00    Total pack years: 15.00    Types: Cigarettes    Quit date: 01/2022  Years since quitting: 0.8    Passive exposure: Past   Smokeless tobacco: Never  Vaping Use   Vaping Use: Never used  Substance and Sexual Activity   Alcohol use: Yes    Comment: occ   Drug use: No   Sexual activity: Yes    Birth control/protection: Surgical    Comment: tubal   Other Topics Concern   Not on file  Social History Narrative   Lives with husband and kids   Right handed   Drinks 9+ cups caffeine daily   Social Determinants of Health   Financial Resource Strain: Low Risk  (03/03/2022)   Overall Financial Resource Strain (CARDIA)    Difficulty of Paying Living Expenses: Not very hard  Recent Concern: Financial Resource Strain - High Risk (01/14/2022)   Overall Financial Resource Strain (CARDIA)    Difficulty of Paying Living Expenses: Hard  Food Insecurity: No Food Insecurity (08/06/2022)   Hunger Vital Sign    Worried About Running Out of Food in the Last Year: Never true    Ran Out of Food in the Last Year: Never true  Transportation Needs: No Transportation Needs (08/03/2022)   PRAPARE - Administrator, Civil Service (Medical): No    Lack of Transportation (Non-Medical): No  Physical Activity: Inactive (03/03/2022)   Exercise Vital Sign    Days of Exercise per Week: 0 days    Minutes of Exercise per Session: 0 min  Stress: No Stress Concern Present (03/03/2022)   [employer] of Occupational Health - Occupational Stress Questionnaire    Feeling of Stress : Only a little  Recent Concern: Stress - Stress Concern Present (12/22/2021)   [employer] of Occupational Health - Occupational Stress Questionnaire    Feeling of Stress : To some extent  Social Connections: Socially Integrated (03/03/2022)   Social Connection and Isolation Panel [NHANES]    Frequency of Communication with Friends and Family: More than three times a week    Frequency of  Social Gatherings with Friends and Family: More than three times a week    Attends Religious Services: More than 4 times per year    Active Member of [employer] or Organizations: Yes    Attends Engineer, structural: More than 4 times per year    Marital Status: Married  Catering manager Violence: Not At Risk (08/03/2022)   Humiliation, Afraid, Rape, and Kick questionnaire    Fear of Current or Ex-Partner: No    Emotionally Abused: No    Physically Abused: No    Sexually Abused: No    Family History:    Family History  Problem Relation Age of Onset   Thyroid disease Mother    Hypertension Mother    Hyperlipidemia Mother    CAD Mother    CVA Mother    Hyperlipidemia Father    Hypertension Father    CAD Father    Breast cancer Paternal Grandmother 69   Cancer Paternal Grandmother        lung     ROS:  Constitutional: Denied fever, chills, malaise, night sweats Eyes: Denied vision change or loss Ears/Nose/Mouth/Throat: Denied ear ache, sore throat, coughing, sinus pain Cardiovascular: see HPI  Respiratory: see HPI  Gastrointestinal: Denied nausea, vomiting, abdominal pain, diarrhea Genital/Urinary: Denied dysuria, hematuria, urinary frequency/urgency Musculoskeletal: Denied muscle ache, joint pain, weakness Skin: Denied rash, wound Neuro: hx of CVA  Psych: Denied history of depression/anxiety  Endocrine: history of diabetes    Physical Exam/Data:   Vitals:  11/07/22 1115 11/07/22 1117  BP: (!) 154/92   Pulse: 83   Resp: 18   Temp: 98.7 F (37.1 C)   SpO2: 98%   Weight:  101.5 kg  Height:  5\' 5"  (1.651 m)   No intake or output data in the 24 hours ending 11/07/22 1359    11/07/2022   11:17 AM 10/28/2022   10:46 AM 10/28/2022    9:10 AM  Last 3 Weights  Weight (lbs) 223 lb 12.3 oz 223 lb 14.4 oz 224 lb  Weight (kg) 101.5 kg 101.56 kg 101.606 kg     Body mass index is 37.24 kg/m.   Vitals:  Vitals:   11/07/22 1115  BP: (!) 154/92  Pulse: 83  Resp: 18   Temp: 98.7 F (37.1 C)  SpO2: 98%   General Appearance: In no apparent distress, laying in bed, well nourished  HEENT: Normocephalic, atraumatic. Neck: Supple, trachea midline, no JVDs Cardiovascular: Regular rate and rhythm, normal S1-S2,  no murmur Respiratory: Resting breathing unlabored, lungs sounds clear to auscultation bilaterally, no use of accessory muscles. On room air.  No wheezes, rales or rhonchi.   Gastrointestinal: Bowel sounds positive, abdomen soft, non-tender, non-distended.  Extremities: Able to move all extremities in bed without difficulty, 1+ edema of BLE  Musculoskeletal: Normal muscle bulk and tone Skin: Intact, warm, dry. No rashes or petechiae noted in exposed areas.  Neurologic: Alert, oriented to person, place and time. Fluent speech, no cognitive deficit, no gross focal neuro deficit Psychiatric: Normal affect. Mood is appropriate.    EKG:  The EKG was personally reviewed and demonstrates:    Sinus rhythm 82 bpm  Telemetry:  Telemetry was personally reviewed and demonstrates:    Sinus rhythm 80-90s, PACs   Relevant CV Studies:  LHC on 07/26/22:    Ost LAD to Prox LAD lesion is 75% stenosed.   Prox LAD to Mid LAD lesion is 100% stenosed.   Prox Cx to Dist Cx lesion is 35% stenosed with 35% stenosed side branch in LPAV.   Prox RCA lesion is 95% stenosed.   Mid RCA lesion is 95% stenosed.   Dist RCA lesion is 100% stenosed.   LV end diastolic pressure is mildly elevated.   2 vessel occlusive CAD. The LAD is occluded after a small diagonal. The RCA is small and diffusely diseased. It is occluded distally Mildly elevated LVEDP   Plan: medical management. No suitable targets for revascularization.     Laboratory Data:  High Sensitivity Troponin:   Recent Labs  Lab 11/07/22 1129  TROPONINIHS 923*     Chemistry Recent Labs  Lab 11/07/22 1129  NA 138  K 4.6  CL 103  CO2 24  GLUCOSE 183*  BUN 31*  CREATININE 2.76*  CALCIUM 8.7*   GFRNONAA 22*  ANIONGAP 11    No results for input(s): "PROT", "ALBUMIN", "AST", "ALT", "ALKPHOS", "BILITOT" in the last 168 hours. Lipids No results for input(s): "CHOL", "TRIG", "HDL", "LABVLDL", "LDLCALC", "CHOLHDL" in the last 168 hours.  Hematology Recent Labs  Lab 11/07/22 1129  WBC 10.2  RBC 3.98  HGB 10.2*  HCT 33.9*  MCV 85.2  MCH 25.6*  MCHC 30.1  RDW 17.9*  PLT 446*   Thyroid No results for input(s): "TSH", "FREET4" in the last 168 hours.  BNPNo results for input(s): "BNP", "PROBNP" in the last 168 hours.  DDimer No results for input(s): "DDIMER" in the last 168 hours.   Radiology/Studies:  DG Chest 2 View  Result  Date: 11/07/2022 CLINICAL DATA:  Chest pain. EXAM: CHEST - 2 VIEW COMPARISON:  10/11/2022.  CT, 08/12/2022. FINDINGS: Cardiac silhouette is mildly enlarged. No mediastinal or hilar masses. No evidence of adenopathy. Clear lungs.  No pleural effusion or pneumothorax. Skeletal structures are intact. IMPRESSION: No active cardiopulmonary disease. Electronically Signed   By: Amie Portland M.D.   On: 11/07/2022 12:22     Assessment and Plan:   NSTEMI Multivessel CAD  -Most recent LHC 07/26/2022 with two-vessel occlusive CAD : LAD and RCA, no target for revascularization.  LVEDP mildly elevated at 12 mmHg. Recommended medical management -Now presents with resting chest pain and shortness of breath, BLE edema, nausea -Hs trop 923 >990, BNP 1973, Cr 2.76 and EGFR 22.  -EKG without acute changes -Chest x-ray revealed no acute finding -She had no revascularization target from recent heart cath, advanced kidney disease makes her a poor candidate for invasive ischemic evaluation, chest pain and abnormal labs may be due to decompensated heart failure - will diurese to euvolemia -Medical therapy: INR above 3 while on Coumadin, hold off heparin gtt; resume PTA Plavix 75 mg daily, Lipitor 40 mg daily, isosorbide 40 mg twice daily, Toprol XL 100 mg daily, Ranexa 1000 mg  twice daily, will add amlodipine 5 mg daily for antianginal therapy   Acute on chronic systolic and diastolic heart failure Ischemic cardiomyopathy -Clinically hypervolemic on exam -Most recent echo from 07/24/2022 with LVEF 30 to 35%, multiple wall motion abnormality, moderate LVH, indeterminate diastolic parameter, mildly reduced RV, moderate LAE, mildly elevated PASP 37.1 mmHg -On PTA Lasix 40 mg daily, will start IV Lasix 40 mg twice daily, pending urine output, may need higher dose Lasix -Please monitor intake and output, daily weight  - GDMT: Continue PTA Toprol XL, isosorbide, hydralazine; not a candidate for ARNI/SGLT2i/MRA due to advanced renal disease  HTN - BP elevated, add amlodipine to home regimen, trend BP  History of CVA PAD requiring intervention Type 2 DM  CKD IV - per primary team     New York Heart Association (NYHA) Functional Class NYHA Class IV        For questions or updates, please contact [facility] Please consult [email or web address] for contact info under    Signed, Cyndi Bender, NP  11/07/2022 1:59 PM  ATTENDING ATTESTATION  I have seen and examined this patient with the APP.  I have reviewed the attached note and agree with its content unless detailed differently below.    The patient reports that she has chronic nausea controlled with a scopolamine patch, but she regularly wakes on the day of her scheduled patch change with nausea.  This a.m., she awoke with nausea and emesis.  She then developed chest pain identical to that of her last admission for NSTEMI, this time accompanied by shortness of breath.  The symptoms persisted until she presented to the [facility], ER and was given nitroglycerin.  Symptoms resolved with nitroglycerin.  GEN: Obese, well developed, in no acute distress  Cardiac: RRR; no murmurs, rubs, or gallops, no edema  Respiratory:  normal work of breathing  NSTEMI  Suspect increased demand with fixed multivessel  obstructions She has known multivessel coronary disease without targets for revascularization I think the risk of renal injury with coronary angiogram would outweigh the benefit Holding heparin as chest pain has resolved and INR is elevated Continue Plavix 75, Lipitor 40 mg, Toprol XL 100 mg, Ranexa 1000 mg, isosorbide 40 mg Add amlodipine 5; BP is elevated  Acute on chronic CHF with reduced EF  Mild exacerbation with slight dependent edema, dyspnea solved with nitroglycerin, BNP elevation Will diurese with IV Lasix, reevaluate tomorrow  Chronic nausea Emesis appears to have triggered this event I recommending changing the scopolamine patch the evening of the second day rather than the morning of the third day  History of stroke Residual right arm deficits Continue medical therapy detailed above  CKD IV Per primary team  York Pellant MD 11/07/2022 3:36 PM

## 2022-11-07 NOTE — ED Notes (Signed)
ED TO INPATIENT HANDOFF REPORT  ED Nurse Name and Phone #: Dot Lanes, paramedic [phone]  S Name/Age/Gender Lauren [name] 42 y.o. female Room/Bed: 020C/020C  Code Status   Code Status: DNR  Home/SNF/Other Home Patient oriented to: self, place, time, and situation Is this baseline? Yes   Triage Complete: Triage complete  Chief Complaint Chest pain due to CAD [facility]) [I25.119]  Triage Note Pt complains of chest pain, SOB, nausea and vomiting that started this morning. Nausea and vomiting started around 845am. Pt took nitroglycerin at 905 and 0910 with no relief of pain.    Allergies Allergies  Allergen Reactions   Visipaque [Iodixanol] Nausea And Vomiting    Patient had vagal response during procedure became hypertensive, and bradycardic. CO2 used during procedure prior to contrast being used, this may be cause as well. Recommended premedicating prior to contrast being used in the future.    Wellbutrin [Bupropion] Hives   Gabapentin     dizzy   Cefepime Rash    Tolerates penicilllin   Ciprofloxacin Hcl Hives and Rash    Hives/rash at injection site; 01/15/22 tolerated IV cipro   Tape Rash    Level of Care/Admitting Diagnosis ED Disposition     ED Disposition  Admit   Condition  --   Comment  Hospital Area[facility] [[id number]]  Level of Care: Telemetry Medical [104]  May place patient in observation at [facility] or [facility] if equivalent level of care is available:: No  Covid Evaluation: Asymptomatic - no recent exposure (last 10 days) testing not required  Diagnosis: Chest pain due to CAD [facility]) [[id number]]  Admitting Physician: Lonia Blood [[id number]]  Attending Physician: Sharon Seller, JEFFREY T [[id number]]          B Medical/Surgery History Past Medical History:  Diagnosis Date   Anemia    CAD (coronary artery disease)    a. s/p cath in 03/2014 showing 30% mid-LAD, moderate to severe disease along small D1, patent LCx, moderate to severe  distal OM2 stenosis and moderate diffuse diease along RCA not amenable to PCI   CHF (congestive heart failure) (HCC)    a. EF 55-60% in 12/2019 b. EF at 35-40% by echo in 05/2020   CKD (chronic kidney disease) stage 4, GFR 15-29 ml/min (HCC)    Diabetes mellitus without complication (HCC)    Myocardial infarction (HCC)    Neuropathy    PVD (peripheral vascular disease) (HCC)    Stroke (HCC) 01/2022   L hand weakness   Past Surgical History:  Procedure Laterality Date   ABDOMINAL AORTOGRAM W/LOWER EXTREMITY N/A 06/10/2022   Procedure: ABDOMINAL AORTOGRAM W/LOWER EXTREMITY;  Surgeon: Cephus Shelling, MD;  [facility]: [facility] INVASIVE CV LAB;  Service: Cardiovascular;  Laterality: N/A;   AMPUTATION Left 09/02/2021   Procedure: AMPUTATION RAY;  Surgeon: Edwin Cap, DPM;  Location: [facility] OR;  Service: Podiatry;  Laterality: Left;  sagittal saw, 3L bag saline & Pulse   Cardiac catherization     CHOLECYSTECTOMY     I & D EXTREMITY Left 08/30/2021   Procedure: IRRIGATION AND DEBRIDEMENT WITH BONE BIOPSY;  Surgeon: Candelaria Stagers, DPM;  Location: [facility] OR;  Service: Podiatry;  Laterality: Left;   IR ANGIO VERTEBRAL SEL SUBCLAVIAN INNOMINATE UNI L MOD SED  01/18/2022   IR CT HEAD LTD  01/08/2022   IR INTRA CRAN STENT  01/08/2022   IR PERCUTANEOUS ART THROMBECTOMY/INFUSION INTRACRANIAL INC DIAG ANGIO  01/08/2022   IRRIGATION AND DEBRIDEMENT FOOT Left 09/04/2021  Procedure: IRRIGATION AND DEBRIDEMENT FOOT AND CLOSURE;  Surgeon: Edwin Cap, DPM;  Location: [facility] OR;  Service: Podiatry;  Laterality: Left;   LEFT HEART CATH AND CORONARY ANGIOGRAPHY N/A 07/26/2022   Procedure: LEFT HEART CATH AND CORONARY ANGIOGRAPHY;  Surgeon: [place]az[place], Peter M, MD;  Location: [facility] INVASIVE CV LAB;  Service: Cardiovascular;  Laterality: N/A;   METATARSAL OSTEOTOMY Left 07/21/2022   Procedure: LEFT FOOT EXCISION OF FIFTH METATARSAL WITH DEBRIDEMENT OF ULCER, POSSIBLE BONE BIOPSY OF THE FOURTH METATARSAL, POSSIBLE FOURTH RAY AMPUTATION  OF LEFT TOEAND METATARSAL;  Surgeon: Vivi Barrack, DPM;  Location: [facility] OR;  Service: Podiatry;  Laterality: Left;   PERIPHERAL VASCULAR BALLOON ANGIOPLASTY Left 06/10/2022   Procedure: PERIPHERAL VASCULAR BALLOON ANGIOPLASTY;  Surgeon: Cephus Shelling, MD;  [facility] IN[facility] CV LAB;  Service: Cardiovascular;  Laterality: Left;  AT and DP   PERIPHERAL VASCULAR INTERVENTION Left 06/10/2022   Procedure: PERIPHERAL VASCULAR INTERVENTION;  Surgeon: Cephus Shelling, MD;  Location: [facility] INVASIVE CV LAB;  Service: Cardiovascular;  Laterality: Left;  SFA   RADIOLOGY WITH ANESTHESIA N/A 01/08/2022   Procedure: IR WITH ANESTHESIA;  Surgeon: Radiologist, Medication, MD;  Location: [facility];  Service: Radiology;  Laterality: N/A;   THROMBECTOMY BRACHIAL ARTERY Right 11/18/2021   Procedure: RIGHT BRACHIAL, RADIAL, & ULNAR ARTERY THROMBECTOMY.;  Surgeon: Cephus Shelling, MD;  Location: [facility] OR;  Service: Vascular;  Laterality: Right;   TUBAL LIGATION       A IV Location/Drains/Wounds Patient Lines/Drains/Airways Status     Active Line/Drains/Airways     Name Placement date Placement time Site Days   Peripheral IV 11/07/22 20 G 1" Right;Lateral;Posterior Forearm 11/07/22  1318  Forearm  less than 1   Wound / Incision (Open or Dehisced) 01/28/22 Irritant Dermatitis (Moisture Associated Skin Damage) Sacrum Posterior;Upper slightly red, little skin breakdown 01/28/22  1653  Sacrum  283   Wound / Incision (Open or Dehisced) Other (Comment) Foot Left;Posterior --  --  Foot  --            Intake/Output Last 24 hours No intake or output data in the 24 hours ending 11/07/22 1531  Labs/Imaging Results for orders placed or performed during the hospital encounter of 11/07/22 (from the past 48 hour(s))  Basic metabolic panel     Status: Abnormal   Collection Time: 11/07/22 11:29 AM  Result Value Ref Range   Sodium 138 135 - 145 mmol/L   Potassium 4.6 3.5 - 5.1 mmol/L   Chloride 103 98 - 111  mmol/L   CO2 24 22 - 32 mmol/L   Glucose, Bld 183 (H) 70 - 99 mg/dL    Comment: Glucose reference range applies only to samples taken after fasting for at least 8 hours.   BUN 31 (H) 6 - 20 mg/dL   Creatinine, Ser 4.78 (H) 0.44 - 1.00 mg/dL   Calcium 8.7 (L) 8.9 - 10.3 mg/dL   GFR, Estimated 22 (L) >60 mL/min    Comment: (NOTE) Calculated using the CKD-EPI Creatinine Equation (2021)    Anion gap 11 5 - 15    Comment: Performed at [facility], [address], [place], [place] [address]  CBC     Status: Abnormal   Collection Time: 11/07/22 11:29 AM  Result Value Ref Range   WBC 10.2 4.0 - 10.5 K/uL   RBC 3.98 3.87 - 5.11 MIL/uL   Hemoglobin 10.2 (L) 12.0 - 15.0 g/dL   HCT 13.0 (L) 86.5 - 78.4 %  MCV 85.2 80.0 - 100.0 fL   MCH 25.6 (L) 26.0 - 34.0 pg   MCHC 30.1 30.0 - 36.0 g/dL   RDW 16.1 (H) 09.6 - 04.5 %   Platelets 446 (H) 150 - 400 K/uL   nRBC 0.0 0.0 - 0.2 %    Comment: Performed at [facility], [address] [place], [place] [address]  Troponin I (High Sensitivity)     Status: Abnormal   Collection Time: 11/07/22 11:29 AM  Result Value Ref Range   Troponin I (High Sensitivity) 923 (HH) <18 ng/L    Comment: CRITICAL RESULT CALLED TO, READ BACK BY AND VERIFIED WITH K Fritzi Scripter PARAMEDIC AT [id number] BY D LONG (NOTE) Elevated high sensitivity troponin I (hsTnI) values and significant  changes across serial measurements may suggest ACS but many other  chronic and acute conditions are known to elevate hsTnI results.  Refer to the "Links" section for chest pain algorithms and additional  guidance. Performed at [facility], [address] [place], [place] [address]   hCG, serum, qualitative     Status: None   Collection Time: 11/07/22 11:29 AM  Result Value Ref Range   Preg, Serum NEGATIVE NEGATIVE    Comment:        THE SENSITIVITY OF THIS METHODOLOGY IS >10 mIU/mL. Performed at [facility], [address], [place], [place] [address]    Protime-INR (order if Patient is taking Coumadin / Warfarin)     Status: Abnormal   Collection Time: 11/07/22 11:29 AM  Result Value Ref Range   Prothrombin Time 32.1 (H) 11.4 - 15.2 seconds   INR 3.1 (H) 0.8 - 1.2    Comment: (NOTE) INR goal varies based on device and disease states. Performed at [facility], [address] [place], [place] [address]   Brain natriuretic peptide     Status: Abnormal   Collection Time: 11/07/22 11:29 AM  Result Value Ref Range   B Natriuretic Peptide 1,973.2 (H) 0.0 - 100.0 pg/mL    Comment: Performed at [facility], [address] [place], [place] [address]  Troponin I (High Sensitivity)     Status: Abnormal   Collection Time: 11/07/22  1:20 PM  Result Value Ref Range   Troponin I (High Sensitivity) 990 (HH) <18 ng/L    Comment: CRITICAL VALUE NOTED. VALUE IS CONSISTENT WITH PREVIOUSLY REPORTED/CALLED VALUE (NOTE) Elevated high sensitivity troponin I (hsTnI) values and significant  changes across serial measurements may suggest ACS but many other  chronic and acute conditions are known to elevate hsTnI results.  Refer to the "Links" section for chest pain algorithms and additional  guidance. Performed at [facility], [address] [place], [place] [address]    DG Chest 2 View  Result Date: 11/07/2022 CLINICAL DATA:  Chest pain. EXAM: CHEST - 2 VIEW COMPARISON:  10/11/2022.  CT, 08/12/2022. FINDINGS: Cardiac silhouette is mildly enlarged. No mediastinal or hilar masses. No evidence of adenopathy. Clear lungs.  No pleural effusion or pneumothorax. Skeletal structures are intact. IMPRESSION: No active cardiopulmonary disease. Electronically Signed   By: Amie Portland M.D.   On: 11/07/2022 12:22    Pending Labs Unresulted Labs (From admission, onward)     Start     Ordered   11/08/22 0500  Protime-INR  Daily,   R      11/07/22 1324   11/08/22 0500  CBC  Tomorrow morning,   R  11/07/22 1450   11/08/22 0500   Comprehensive metabolic panel  Tomorrow morning,   R        11/07/22 1450            Vitals/Pain Today's Vitals   11/07/22 1115 11/07/22 1117  BP: (!) 154/92   Pulse: 83   Resp: 18   Temp: 98.7 F (37.1 C)   SpO2: 98%   Weight:  223 lb 12.3 oz (101.5 kg)  Height:  5\' 5"  (1.651 m)  PainSc:  5     Isolation Precautions No active isolations  Medications Medications  acetaminophen (TYLENOL) tablet 650 mg (has no administration in time range)  ondansetron (ZOFRAN) injection 4 mg (has no administration in time range)  lactated ringers infusion (has no administration in time range)  atorvastatin (LIPITOR) tablet 40 mg (has no administration in time range)  clopidogrel (PLAVIX) tablet 75 mg (has no administration in time range)  gabapentin (NEURONTIN) capsule 100 mg (has no administration in time range)  hydrALAZINE (APRESOLINE) tablet 25 mg (has no administration in time range)  isosorbide mononitrate (ISMO) tablet 40 mg (has no administration in time range)  levothyroxine (SYNTHROID) tablet 125 mcg (has no administration in time range)  metoprolol succinate (TOPROL-XL) 24 hr tablet 100 mg (has no administration in time range)  multivitamin with minerals tablet 1 tablet (has no administration in time range)  nitroGLYCERIN (NITROSTAT) SL tablet 0.4 mg (has no administration in time range)  ranolazine (RANEXA) 12 hr tablet 1,000 mg (has no administration in time range)  scopolamine (TRANSDERM-SCOP) 1 MG/3DAYS 1.5 mg (has no administration in time range)  senna-docusate (Senokot-S) tablet 1-2 tablet (has no administration in time range)  tiZANidine (ZANAFLEX) tablet 4 mg (has no administration in time range)  oxyCODONE-acetaminophen (PERCOCET) 7.5-325 MG per tablet 1 tablet (has no administration in time range)  insulin pump (has no administration in time range)  morphine (PF) 2 MG/ML injection 2 mg (2 mg Intravenous Given 11/07/22 1437)    Mobility walks with person assist      Focused Assessments     R Recommendations: See Admitting Provider Note  Report given to:   Additional Notes:

## 2022-11-07 NOTE — Plan of Care (Signed)
  Problem: Education: Goal: Understanding of cardiac disease, CV risk reduction, and recovery process will improve Outcome: Progressing Goal: Individualized Educational Video(s) Outcome: Progressing   Problem: Activity: Goal: Ability to tolerate increased activity will improve Outcome: Progressing   Problem: Cardiac: Goal: Ability to achieve and maintain adequate cardiovascular perfusion will improve Outcome: Progressing   Problem: Health Behavior/Discharge Planning: Goal: Ability to safely manage health-related needs after discharge will improve Outcome: Progressing   Problem: Education: Goal: Knowledge of General Education information will improve Description: Including pain rating scale, medication(s)/side effects and non-pharmacologic comfort measures Outcome: Progressing   Problem: Health Behavior/Discharge Planning: Goal: Ability to manage health-related needs will improve Outcome: Progressing   Problem: Clinical Measurements: Goal: Ability to maintain clinical measurements within normal limits will improve Outcome: Progressing Goal: Will remain free from infection Outcome: Progressing Goal: Diagnostic test results will improve Outcome: Progressing Goal: Respiratory complications will improve Outcome: Progressing Goal: Cardiovascular complication will be avoided Outcome: Progressing   Problem: Activity: Goal: Risk for activity intolerance will decrease Outcome: Progressing   Problem: Nutrition: Goal: Adequate nutrition will be maintained Outcome: Progressing   Problem: Coping: Goal: Level of anxiety will decrease Outcome: Progressing   Problem: Elimination: Goal: Will not experience complications related to bowel motility Outcome: Progressing Goal: Will not experience complications related to urinary retention Outcome: Progressing   Problem: Pain Managment: Goal: General experience of comfort will improve Outcome: Progressing   Problem:  Safety: Goal: Ability to remain free from injury will improve Outcome: Progressing   Problem: Skin Integrity: Goal: Risk for impaired skin integrity will decrease Outcome: Progressing   

## 2022-11-08 ENCOUNTER — Other Ambulatory Visit: Payer: Self-pay | Admitting: Internal Medicine

## 2022-11-08 DIAGNOSIS — I639 Cerebral infarction, unspecified: Secondary | ICD-10-CM

## 2022-11-08 DIAGNOSIS — Z87891 Personal history of nicotine dependence: Secondary | ICD-10-CM | POA: Diagnosis not present

## 2022-11-08 DIAGNOSIS — I255 Ischemic cardiomyopathy: Secondary | ICD-10-CM | POA: Diagnosis present

## 2022-11-08 DIAGNOSIS — I214 Non-ST elevation (NSTEMI) myocardial infarction: Secondary | ICD-10-CM | POA: Diagnosis present

## 2022-11-08 DIAGNOSIS — I69354 Hemiplegia and hemiparesis following cerebral infarction affecting left non-dominant side: Secondary | ICD-10-CM | POA: Diagnosis not present

## 2022-11-08 DIAGNOSIS — I130 Hypertensive heart and chronic kidney disease with heart failure and stage 1 through stage 4 chronic kidney disease, or unspecified chronic kidney disease: Secondary | ICD-10-CM | POA: Diagnosis present

## 2022-11-08 DIAGNOSIS — N179 Acute kidney failure, unspecified: Secondary | ICD-10-CM | POA: Diagnosis present

## 2022-11-08 DIAGNOSIS — E669 Obesity, unspecified: Secondary | ICD-10-CM | POA: Diagnosis present

## 2022-11-08 DIAGNOSIS — E1022 Type 1 diabetes mellitus with diabetic chronic kidney disease: Secondary | ICD-10-CM | POA: Diagnosis present

## 2022-11-08 DIAGNOSIS — Z95820 Peripheral vascular angioplasty status with implants and grafts: Secondary | ICD-10-CM | POA: Diagnosis not present

## 2022-11-08 DIAGNOSIS — E782 Mixed hyperlipidemia: Secondary | ICD-10-CM | POA: Diagnosis present

## 2022-11-08 DIAGNOSIS — I25119 Atherosclerotic heart disease of native coronary artery with unspecified angina pectoris: Secondary | ICD-10-CM | POA: Diagnosis not present

## 2022-11-08 DIAGNOSIS — Z888 Allergy status to other drugs, medicaments and biological substances status: Secondary | ICD-10-CM | POA: Diagnosis not present

## 2022-11-08 DIAGNOSIS — L97509 Non-pressure chronic ulcer of other part of unspecified foot with unspecified severity: Secondary | ICD-10-CM | POA: Diagnosis not present

## 2022-11-08 DIAGNOSIS — Z66 Do not resuscitate: Secondary | ICD-10-CM | POA: Diagnosis present

## 2022-11-08 DIAGNOSIS — Z9641 Presence of insulin pump (external) (internal): Secondary | ICD-10-CM | POA: Diagnosis present

## 2022-11-08 DIAGNOSIS — E1040 Type 1 diabetes mellitus with diabetic neuropathy, unspecified: Secondary | ICD-10-CM | POA: Diagnosis present

## 2022-11-08 DIAGNOSIS — I200 Unstable angina: Secondary | ICD-10-CM | POA: Diagnosis present

## 2022-11-08 DIAGNOSIS — Z881 Allergy status to other antibiotic agents status: Secondary | ICD-10-CM | POA: Diagnosis not present

## 2022-11-08 DIAGNOSIS — Z794 Long term (current) use of insulin: Secondary | ICD-10-CM | POA: Diagnosis not present

## 2022-11-08 DIAGNOSIS — I5043 Acute on chronic combined systolic (congestive) and diastolic (congestive) heart failure: Secondary | ICD-10-CM | POA: Diagnosis present

## 2022-11-08 DIAGNOSIS — E10621 Type 1 diabetes mellitus with foot ulcer: Secondary | ICD-10-CM | POA: Diagnosis present

## 2022-11-08 DIAGNOSIS — D6862 Lupus anticoagulant syndrome: Secondary | ICD-10-CM | POA: Diagnosis present

## 2022-11-08 DIAGNOSIS — E039 Hypothyroidism, unspecified: Secondary | ICD-10-CM | POA: Diagnosis present

## 2022-11-08 DIAGNOSIS — Z79899 Other long term (current) drug therapy: Secondary | ICD-10-CM | POA: Diagnosis not present

## 2022-11-08 DIAGNOSIS — E1051 Type 1 diabetes mellitus with diabetic peripheral angiopathy without gangrene: Secondary | ICD-10-CM | POA: Diagnosis present

## 2022-11-08 DIAGNOSIS — N184 Chronic kidney disease, stage 4 (severe): Secondary | ICD-10-CM | POA: Diagnosis present

## 2022-11-08 DIAGNOSIS — Z79890 Hormone replacement therapy (postmenopausal): Secondary | ICD-10-CM | POA: Diagnosis not present

## 2022-11-08 LAB — COMPREHENSIVE METABOLIC PANEL
ALT: 59 U/L — ABNORMAL HIGH (ref 0–44)
AST: 92 U/L — ABNORMAL HIGH (ref 15–41)
Albumin: 2.8 g/dL — ABNORMAL LOW (ref 3.5–5.0)
Alkaline Phosphatase: 111 U/L (ref 38–126)
Anion gap: 11 (ref 5–15)
BUN: 30 mg/dL — ABNORMAL HIGH (ref 6–20)
CO2: 21 mmol/L — ABNORMAL LOW (ref 22–32)
Calcium: 8.5 mg/dL — ABNORMAL LOW (ref 8.9–10.3)
Chloride: 102 mmol/L (ref 98–111)
Creatinine, Ser: 2.83 mg/dL — ABNORMAL HIGH (ref 0.44–1.00)
GFR, Estimated: 21 mL/min — ABNORMAL LOW (ref >60)
Glucose, Bld: 220 mg/dL — ABNORMAL HIGH (ref 70–99)
Potassium: 4.3 mmol/L (ref 3.5–5.1)
Sodium: 134 mmol/L — ABNORMAL LOW (ref 135–145)
Total Bilirubin: 0.7 mg/dL (ref 0.3–1.2)
Total Protein: 6.1 g/dL — ABNORMAL LOW (ref 6.5–8.1)

## 2022-11-08 LAB — CBC
HCT: 32.2 % — ABNORMAL LOW (ref 36.0–46.0)
Hemoglobin: 10.3 g/dL — ABNORMAL LOW (ref 12.0–15.0)
MCH: 26.8 pg (ref 26.0–34.0)
MCHC: 32 g/dL (ref 30.0–36.0)
MCV: 83.9 fL (ref 80.0–100.0)
Platelets: 420 10*3/uL — ABNORMAL HIGH (ref 150–400)
RBC: 3.84 MIL/uL — ABNORMAL LOW (ref 3.87–5.11)
RDW: 18.2 % — ABNORMAL HIGH (ref 11.5–15.5)
WBC: 10.9 10*3/uL — ABNORMAL HIGH (ref 4.0–10.5)
nRBC: 0 % (ref 0.0–0.2)

## 2022-11-08 LAB — GLUCOSE, CAPILLARY
Glucose-Capillary: 112 mg/dL — ABNORMAL HIGH (ref 70–99)
Glucose-Capillary: 151 mg/dL — ABNORMAL HIGH (ref 70–99)
Glucose-Capillary: 166 mg/dL — ABNORMAL HIGH (ref 70–99)
Glucose-Capillary: 186 mg/dL — ABNORMAL HIGH (ref 70–99)
Glucose-Capillary: 227 mg/dL — ABNORMAL HIGH (ref 70–99)
Glucose-Capillary: 266 mg/dL — ABNORMAL HIGH (ref 70–99)
Glucose-Capillary: 291 mg/dL — ABNORMAL HIGH (ref 70–99)

## 2022-11-08 LAB — PROTIME-INR
INR: 2.8 — ABNORMAL HIGH (ref 0.8–1.2)
Prothrombin Time: 29.7 seconds — ABNORMAL HIGH (ref 11.4–15.2)

## 2022-11-08 MED ORDER — WARFARIN SODIUM 5 MG PO TABS
5.0000 mg | ORAL_TABLET | Freq: Once | ORAL | Status: AC
  Administered 2022-11-08: 5 mg via ORAL
  Filled 2022-11-08: qty 1

## 2022-11-08 MED ORDER — WARFARIN - PHARMACIST DOSING INPATIENT
Freq: Every day | Status: DC
  Administered 2022-11-10: 1

## 2022-11-08 MED ORDER — ISOSORBIDE MONONITRATE ER 60 MG PO TB24
120.0000 mg | ORAL_TABLET | Freq: Every day | ORAL | Status: DC
  Administered 2022-11-08 – 2022-11-12 (×5): 120 mg via ORAL
  Filled 2022-11-08 (×5): qty 2

## 2022-11-08 MED ORDER — TRAZODONE HCL 50 MG PO TABS
150.0000 mg | ORAL_TABLET | Freq: Every day | ORAL | Status: DC
  Administered 2022-11-08 – 2022-11-11 (×4): 150 mg via ORAL
  Filled 2022-11-08 (×4): qty 1

## 2022-11-08 NOTE — Progress Notes (Signed)
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    Goals of Care:  Code Status: DNR   DVT prophylaxis: Warfarin  Interim Hx: No acute events recorded overnight.  Afebrile.  Vital signs stable.  Patient reports continued intermittent episodes of substernal chest pressure.  Cardiology has adjusted her medical therapy.  She appears comfortable at the time of visit.  She denies shortness of breath or abdominal pain.  Assessment & Plan:  Known multivessel CAD -recurrent USAP Cardiac cath 07/26/2022 noted the following:       Ost LAD to Prox LAD lesion is 75% stenosed.   Prox LAD to Mid LAD lesion is 100% stenosed.   Prox Cx to Dist Cx lesion is 35% stenosed with 35% stenosed side branch in LPAV.   Prox RCA lesion is 95% stenosed.   Mid RCA lesion is 95% stenosed.   Dist RCA lesion is 100% stenosed. The patient has previously been deemed an inappropriate candidate for PCI or CABG with medical therapy being the only option - given resolution of pain cardiology recommended discontinuing IV heparin - already on Plavix, statin, beta-blocker, and Ranexa at time of admission -troponin appears to have peaked at 990 and is now on a downward trend -long-acting nitrate dose increased today by cardiology   Chronic systolic CHF Mild edema on physical exam but no respiratory distress -continue IV diuresis for now and monitor   History of suspected thrombotic CVA and right upper extremity  brachial artery embolus 2023 On chronic warfarin therapy -scheduled to undergo hematologic evaluation at [facility] to determine if long-term anticoagulation appropriate -continue warfarin at this time   PVD Status post angioplasty with stenting of the left SFA February 2024   HTN Blood pressure elevated above goal -will not adjust in acute setting but will need outpatient follow-up -continue diuresis which should also assist   History of CVA with chronic left hemiparesis Clinically stable on exam   DM2 Utilizes insulin pump at home -allow patient to continue using her insulin pump during this hospital stay -CBG presently well-controlled   HLD Continue usual medical therapy   CKD stage IV Creatinine appears to be stable at her baseline   Obesity - Body mass index is 37.24 kg/m.   Family Communication: No family present at time of exam Disposition: Anticipate discharge home when angina better controlled   Objective: Blood pressure (!) 151/86, pulse 81, temperature 98.7 F (37.1 C), temperature source Oral, resp. rate 17, height 5\' 5"  (1.651 m), weight 102.2 kg, last menstrual period 11/05/2022, SpO2 100 %.  Intake/Output Summary (Last 24 hours) at 11/08/2022 0747 Last data filed at 11/07/2022 2145 Gross per 24 hour  Intake 480 ml  Output --  Net 480 ml   Filed Weights   11/07/22 1117 11/08/22 0552  Weight: 101.5 kg 102.2 kg    Examination: General: No acute respiratory distress Lungs: Clear to auscultation bilaterally without wheezes or crackles Cardiovascular: Regular rate and rhythm without murmur  Abdomen: Nontender, nondistended, soft, bowel sounds positive, no rebound, no ascites,  no appreciable mass Extremities: 1+ bilateral lower extremity edema to knees bilaterally  CBC: Recent Labs  Lab 11/07/22 1129 11/08/22 0105  WBC 10.2 10.9*  HGB 10.2* 10.3*  HCT 33.9* 32.2*  MCV 85.2 83.9  PLT 446* 420*   Basic Metabolic Panel: Recent Labs  Lab 11/07/22 1129  11/08/22 0105  NA 138 134*  K 4.6 4.3  CL 103 102  CO2 24 21*  GLUCOSE 183* 220*  BUN 31* 30*  CREATININE 2.76* 2.83*  CALCIUM 8.7* 8.5*   GFR: Estimated Creatinine Clearance: 31.3 mL/min (A) (by C-G formula based on SCr of 2.83 mg/dL (H)).   Scheduled Meds:  amLODipine  5 mg Oral Daily   clopidogrel  75 mg Oral Daily   furosemide  40 mg Intravenous BID   gabapentin  100 mg Oral TID   hydrALAZINE  25 mg Oral Q8H   insulin pump   Subcutaneous Q4H   isosorbide mononitrate  40 mg Oral BID   levothyroxine  125 mcg Oral Q0600   metoprolol succinate  100 mg Oral Daily   multivitamin with minerals  1 tablet Oral Daily   ranolazine  1,000 mg Oral BID   [START ON 11/10/2022] scopolamine  1 patch Transdermal Q72H   tiZANidine  4 mg Oral QHS      LOS: 0 days   Lonia Blood, MD [facility] Office  [phone] Pager - Text Page per Loretha Stapler  If 7PM-7AM, please contact night-coverage per Amion 11/08/2022, 7:47 AM

## 2022-11-08 NOTE — Plan of Care (Signed)
  Problem: Education: Goal: Understanding of cardiac disease, CV risk reduction, and recovery process will improve Outcome: Progressing Goal: Individualized Educational Video(s) Outcome: Progressing   Problem: Activity: Goal: Ability to tolerate increased activity will improve Outcome: Progressing   Problem: Cardiac: Goal: Ability to achieve and maintain adequate cardiovascular perfusion will improve Outcome: Progressing   Problem: Health Behavior/Discharge Planning: Goal: Ability to safely manage health-related needs after discharge will improve Outcome: Progressing   Problem: Education: Goal: Knowledge of General Education information will improve Description: Including pain rating scale, medication(s)/side effects and non-pharmacologic comfort measures Outcome: Progressing   Problem: Health Behavior/Discharge Planning: Goal: Ability to manage health-related needs will improve Outcome: Progressing   Problem: Clinical Measurements: Goal: Ability to maintain clinical measurements within normal limits will improve Outcome: Progressing Goal: Will remain free from infection Outcome: Progressing Goal: Diagnostic test results will improve Outcome: Progressing Goal: Respiratory complications will improve Outcome: Progressing Goal: Cardiovascular complication will be avoided Outcome: Progressing   Problem: Activity: Goal: Risk for activity intolerance will decrease Outcome: Progressing   Problem: Nutrition: Goal: Adequate nutrition will be maintained Outcome: Progressing   Problem: Coping: Goal: Level of anxiety will decrease Outcome: Progressing   Problem: Elimination: Goal: Will not experience complications related to bowel motility Outcome: Progressing Goal: Will not experience complications related to urinary retention Outcome: Progressing   Problem: Pain Managment: Goal: General experience of comfort will improve Outcome: Progressing   Problem:  Safety: Goal: Ability to remain free from injury will improve Outcome: Progressing   Problem: Skin Integrity: Goal: Risk for impaired skin integrity will decrease Outcome: Progressing   

## 2022-11-08 NOTE — Inpatient Diabetes Management (Signed)
Inpatient Diabetes Program Recommendations  AACE/ADA: New Consensus Statement on Inpatient Glycemic Control (2015)  Target Ranges:  Prepandial:   less than 140 mg/dL      Peak postprandial:   less than 180 mg/dL (1-2 hours)      Critically ill patients:  140 - 180 mg/dL   Lab Results  Component Value Date   GLUCAP 151 (H) 11/08/2022   HGBA1C 8.3 (A) 10/18/2022    Review of Glycemic Control  CP DM type 1, Omnipod insulin pump Sees Whitney reardon NP last visit 6/17 Target 110 CHO 1:5 Sensitivity 1:38 Avg basal insulin delivery on Auto mode approx 30 units in 24 hour period conservatively  A1c 8% on 6/17  Current orders for inpatient diabetes management: Insulin pump order set.   Glucose trends at goal. Omnipod insulin pump site left abd with site due to be changed tomorrow, 7/9 around 1:45 pm, Dexcom CGM scheduled to be changed within the next 24 hours location left lower abd.  Spoke with pt at bedside and informed her to bring extra pump supplies tonight to replace Dexcom CGM and Omipod site. Pt has chronic wound that she would like to continue with dressing changes while here. Will ask Hospitalist about wound ostomy consult.  Thanks  Christena Deem RN, MSN, BC-ADM Inpatient Diabetes Coordinator Team Pager [phone] (8a-5p)

## 2022-11-08 NOTE — Progress Notes (Addendum)
   Heart Failure Stewardship Pharmacist Progress Note   PCP: Billie Lade, MD PCP-Cardiologist: Dina Rich, MD    HPI:  42 yo F with PMH of CHF, PVD, type 1 diabetes, hypothyroidism, HLD, CKD IV, CAD, and stroke.   Admitted 07/2022 with NSTEMI in the setting of diabetic foot infection, requiring left fifth metatarsal excision. Underwent cardiac cath and found multivessel CAD without clear target for revascularization. She was recommended medical management. EF at that time showed LVEF 30-35% (was 35-40% in 05/2020) and mildly reduced RV.   Presented to the ED with chest pain, shortness of breath, orthopnea, LE edema, and N/V. CXR without acute finding. BNP elevated. Given previous heart cath findings/recommendations and advanced renal disease, not pursuing additional cath at this time.   Current HF Medications: Diuretic: furosemide 40 mg IV BID Beta Blocker: metoprolol XL 100 mg daily Other: hydralazine 25 mg TID + Imdur 120 mg daily  Prior to admission HF Medications: Diuretic: furosemide 40 mg daily PRN Beta blocker: metoprolol XL 100 mg daily Other: hydralazine 25 mg BID + Imdur 40 mg BID  Pertinent Lab Values: Serum creatinine 2.83, BUN 30, Potassium 4.3, Sodium 134, BNP 1973.2, A1c 8.3   Vital Signs: Weight: 225 lbs (admission weight: 223 lbs) Blood pressure: 120-150/80s  Heart rate: 70-80s  I/O: incomplete  Medication Assistance / Insurance Benefits Check: Does the patient have prescription insurance?  Yes Type of insurance plan: [facility] commercial insurance  Outpatient Pharmacy:  Prior to admission outpatient pharmacy: [facility]  Is the patient willing to use [facility] TOC pharmacy at discharge? Yes Is the patient willing to transition their outpatient pharmacy to utilize a [facility] outpatient pharmacy?   No - has the option to get delivery service with [facility]     Assessment: 1. Acute on chronic systolic CHF (LVEF 30-35%), due to ICM. NYHA  class III symptoms. - Continue furosemide 40 mg IV BID. Strict I/Os and daily weights. Keep K>4 and Mg>2. - Continue metoprolol XL 100 mg daily - Continue hydralazine 25 mg TID - Agree with changing to Imdur 120 mg daily - GDMT limited by advanced CKD - No SGLT2i with type 1 diabetes    Plan: 1) Medication changes recommended at this time: - Agree with changes  2) Patient assistance: - None pending  3)  Education  - Patient has been educated on current HF medications and potential additions to HF medication regimen - Patient verbalizes understanding that over the next few months, these medication doses may change and more medications may be added to optimize HF regimen - Patient has been educated on basic disease state pathophysiology and goals of therapy   Sharen Hones, PharmD, BCPS Heart Failure Stewardship Pharmacist Phone [phone]

## 2022-11-08 NOTE — Progress Notes (Signed)
Progress Note  Patient Name: Lauren [name] Date of Encounter: 11/08/2022  Primary Cardiologist:   Dina Rich, MD   Subjective   Still with chest pain.  She has had increasing angina as an out patient.   Inpatient Medications    Scheduled Meds:  amLODipine  5 mg Oral Daily   clopidogrel  75 mg Oral Daily   furosemide  40 mg Intravenous BID   gabapentin  100 mg Oral TID   hydrALAZINE  25 mg Oral Q8H   insulin pump   Subcutaneous Q4H   isosorbide mononitrate  40 mg Oral BID   levothyroxine  125 mcg Oral Q0600   metoprolol succinate  100 mg Oral Daily   multivitamin with minerals  1 tablet Oral Daily   ranolazine  1,000 mg Oral BID   [START ON 11/10/2022] scopolamine  1 patch Transdermal Q72H   tiZANidine  4 mg Oral QHS   Continuous Infusions:  PRN Meds: acetaminophen, calcium carbonate, nitroGLYCERIN, ondansetron (ZOFRAN) IV, oxyCODONE-acetaminophen, senna-docusate   Vital Signs    Vitals:   11/08/22 0001 11/08/22 0550 11/08/22 0552 11/08/22 0802  BP: 121/71 (!) 151/86 (!) 151/86 117/67  Pulse: 73 80 81 82  Resp: 16 17  18   Temp: 97.9 F (36.6 C) 98.7 F (37.1 C)  97.8 F (36.6 C)  TempSrc: Oral Oral  Oral  SpO2: 99% 100% 100% 100%  Weight:   102.2 kg   Height:        Intake/Output Summary (Last 24 hours) at 11/08/2022 0827 Last data filed at 11/07/2022 2145 Gross per 24 hour  Intake 480 ml  Output --  Net 480 ml   Filed Weights   11/07/22 1117 11/08/22 0552  Weight: 101.5 kg 102.2 kg    Telemetry    NSR - Personally Reviewed  ECG    NSR - Personally Reviewed  Physical Exam   GEN: No acute distress.   Neck: No  JVD Cardiac: RRR, no murmurs, rubs, or gallops.  Respiratory: Clear  to auscultation bilaterally. GI: Soft, nontender, non-distended  MS: Milld  edema; No deformity. Neuro:  Nonfocal  Psych: Normal affect  Labs    Chemistry Recent Labs  Lab 11/07/22 1129 11/08/22 0105  NA 138 134*  K 4.6 4.3  CL 103 102  CO2 24 21*   GLUCOSE 183* 220*  BUN 31* 30*  CREATININE 2.76* 2.83*  CALCIUM 8.7* 8.5*  PROT  --  6.1*  ALBUMIN  --  2.8*  AST  --  92*  ALT  --  59*  ALKPHOS  --  111  BILITOT  --  0.7  GFRNONAA 22* 21*  ANIONGAP 11 11     Hematology Recent Labs  Lab 11/07/22 1129 11/08/22 0105  WBC 10.2 10.9*  RBC 3.98 3.84*  HGB 10.2* 10.3*  HCT 33.9* 32.2*  MCV 85.2 83.9  MCH 25.6* 26.8  MCHC 30.1 32.0  RDW 17.9* 18.2*  PLT 446* 420*    Cardiac EnzymesNo results for input(s): "TROPONINI" in the last 168 hours. No results for input(s): "TROPIPOC" in the last 168 hours.   BNP Recent Labs  Lab 11/07/22 1129  BNP 1,973.2*     DDimer No results for input(s): "DDIMER" in the last 168 hours.   Radiology    DG Chest 2 View  Result Date: 11/07/2022 CLINICAL DATA:  Chest pain. EXAM: CHEST - 2 VIEW COMPARISON:  10/11/2022.  CT, 08/12/2022. FINDINGS: Cardiac silhouette is mildly enlarged. No mediastinal or hilar masses.  No evidence of adenopathy. Clear lungs.  No pleural effusion or pneumothorax. Skeletal structures are intact. IMPRESSION: No active cardiopulmonary disease. Electronically Signed   By: Amie Portland M.D.   On: 11/07/2022 12:22    Cardiac Studies   Cath 07/26/22  Diagnostic Dominance: Co-dominant     Patient Profile     42 y.o. 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   Assessment & Plan     NSTEMI/Multivessel CAD :   I have personally reviewed the cath films.  Needs continued medical management.  Diffuse disease without good targets.  Still with some chest pain.    I will change her bid isosorbide mono to 120 once daily.  Max dose can be 240.  Continue other therapies.    Acute on chronic systolic and diastolic heart failure/Ischemic cardiomyopathy:  EF 30 - 35%.  Intake and output not complete.   I would suggest continue IV diuresis while she is here and her kidneys allow.    HTN:  BPs labile.   Continue current therapy.    CKD IV:  Creat is stable.     History of CVA/PAD/DM    For questions or updates, please contact [facility] Please consult [email or web address] for contact info under Cardiology/STEMI.   Signed, Rollene Rotunda, MD  11/08/2022, 8:27 AM

## 2022-11-08 NOTE — Consult Note (Signed)
WOC Nurse Consult Note: Reason for Consult: chronic foot wound Followed by podiatry, last seen 6/13, per Dr. Lilian Kapur s/p foot surgery March 2024.  Wound type: surgical  Pressure Injury POA: NA Measurement: no open wound Dressing procedure/placement/frequency:  Protect with silicone foam per nursing skin care order set.   Change every 3 days   Follow up with podiatry as scheduled.   Lauren [name] [facility], CNS, [employer] [phone]

## 2022-11-08 NOTE — Progress Notes (Signed)
ANTICOAGULATION CONSULT NOTE - Initial Consult  Pharmacy Consult to resume Warfarin Indication: chest pain/ACS  Allergies  Allergen Reactions   Visipaque [Iodixanol] Nausea And Vomiting    Patient had vagal response during procedure became hypertensive, and bradycardic. CO2 used during procedure prior to contrast being used, this may be cause as well. Recommended premedicating prior to contrast being used in the future.    Wellbutrin [Bupropion] Hives   Gabapentin     dizzy   Cefepime Rash    Tolerates penicilllin   Ciprofloxacin Hcl Hives and Rash    Hives/rash at injection site; 01/15/22 tolerated IV cipro   Tape Rash    Patient Measurements: Height: 5\' 5"  (165.1 cm) Weight: 102.2 kg (225 lb 5 oz) IBW/kg (Calculated) : 57 Heparin Dosing Weight: 80.3 kg  Vital Signs: Temp: 97.8 F (36.6 C) (07/08 0802) Temp Source: Oral (07/08 0802) BP: 117/67 (07/08 0802) Pulse Rate: 82 (07/08 0802)  Labs: Recent Labs    11/07/22 1129 11/07/22 1320 11/07/22 1632 11/07/22 1814 11/08/22 0105  HGB 10.2*  --   --   --  10.3*  HCT 33.9*  --   --   --  32.2*  PLT 446*  --   --   --  420*  LABPROT 32.1*  --   --   --  29.7*  INR 3.1*  --   --   --  2.8*  CREATININE 2.76*  --   --   --  2.83*  TROPONINIHS 923* 990* 961* 856*  --      Estimated Creatinine Clearance: 31.3 mL/min (A) (by C-G formula based on SCr of 2.83 mg/dL (H)).   Medical History: Past Medical History:  Diagnosis Date   Anemia    CAD (coronary artery disease)    a. s/p cath in 03/2014 showing 30% mid-LAD, moderate to severe disease along small D1, patent LCx, moderate to severe distal OM2 stenosis and moderate diffuse diease along RCA not amenable to PCI   CHF (congestive heart failure) (HCC)    a. EF 55-60% in 12/2019 b. EF at 35-40% by echo in 05/2020   CKD (chronic kidney disease) stage 4, GFR 15-29 ml/min (HCC)    Diabetes mellitus without complication (HCC)    Myocardial infarction (HCC)    Neuropathy     PVD (peripheral vascular disease) (HCC)    Stroke (HCC) 01/2022   L hand weakness    Assessment: 42 yof with a history of PVD, T1DM, hypothyroidism, CKD, unstable angina, osteomyelitis of the left foot, MCA stroke. Patient presented on 11/07/22 with chest pain. Heparin per pharmacy consult was ordered for chest pain/ACS ans PTA warfarin held due to potential need for cardiac procedures.   Cardiology consulted, noted that patient has known multivessel CAD without targets for revascularization, and that her risk of renal injury w/coronary angio outweighs benefit.  Thus Heparin consult discontinued (no heparin started due to INR >3 on 11/07/22). Pharmacy consulted to resume Warfarin on 11/08/22.   Patient is on warfarin prior to arrival for stroke. Home dose is 5mg  daily per last anticoag visit note and per PTA medication history per patient's spouse report. Last dose 7/6 pm.   INR  is 2.8 today, therapeutic.  Warfarin dose was held 7/7 for possible cardiac procedure> no procedure recommended per Cardiology. Hgb 10s stable; plt 400s stable. No bleeding noted.   Goal of Therapy:  INR 2-3 Monitor platelets by anticoagulation protocol: Yes   Plan:  Give Warfarin 5mg  today x1 Monitor  daily PT/INR.   Noah Delaine, RPh Clinical Pharmacist 11/08/2022 9:12 AM 7:00-3:00 PM: [phone] 3:00-10:00 PM: [phone] After 10PM, Main Pharmacy [phone]

## 2022-11-08 NOTE — Plan of Care (Signed)
  Problem: Activity: Goal: Ability to tolerate increased activity will improve Outcome: Progressing   Problem: Activity: Goal: Risk for activity intolerance will decrease Outcome: Progressing   Problem: Coping: Goal: Level of anxiety will decrease Outcome: Progressing   

## 2022-11-09 DIAGNOSIS — I25119 Atherosclerotic heart disease of native coronary artery with unspecified angina pectoris: Secondary | ICD-10-CM | POA: Diagnosis not present

## 2022-11-09 LAB — GLUCOSE, CAPILLARY
Glucose-Capillary: 256 mg/dL — ABNORMAL HIGH (ref 70–99)
Glucose-Capillary: 219 mg/dL — ABNORMAL HIGH (ref 70–99)
Glucose-Capillary: 561 mg/dL (ref 70–99)
Glucose-Capillary: 436 mg/dL — ABNORMAL HIGH (ref 70–99)

## 2022-11-09 LAB — BASIC METABOLIC PANEL
Anion gap: 9 (ref 5–15)
BUN: 38 mg/dL — ABNORMAL HIGH (ref 6–20)
CO2: 23 mmol/L (ref 22–32)
Calcium: 8.2 mg/dL — ABNORMAL LOW (ref 8.9–10.3)
Chloride: 102 mmol/L (ref 98–111)
Creatinine, Ser: 3.22 mg/dL — ABNORMAL HIGH (ref 0.44–1.00)
GFR, Estimated: 18 mL/min — ABNORMAL LOW (ref >60)
Glucose, Bld: 267 mg/dL — ABNORMAL HIGH (ref 70–99)
Potassium: 4.3 mmol/L (ref 3.5–5.1)
Sodium: 134 mmol/L — ABNORMAL LOW (ref 135–145)

## 2022-11-09 LAB — MAGNESIUM: Magnesium: 2.3 mg/dL (ref 1.7–2.4)

## 2022-11-09 LAB — PROTIME-INR
INR: 2.8 — ABNORMAL HIGH (ref 0.8–1.2)
Prothrombin Time: 29.9 seconds — ABNORMAL HIGH (ref 11.4–15.2)

## 2022-11-09 MED ORDER — INSULIN DETEMIR 100 UNIT/ML ~~LOC~~ SOLN
26.0000 [IU] | Freq: Every day | SUBCUTANEOUS | Status: DC
  Administered 2022-11-09 – 2022-11-12 (×4): 26 [IU] via SUBCUTANEOUS
  Filled 2022-11-09 (×4): qty 0.26

## 2022-11-09 MED ORDER — INSULIN ASPART 100 UNIT/ML IJ SOLN
0.0000 [IU] | Freq: Every day | INTRAMUSCULAR | Status: DC
  Administered 2022-11-10: 5 [IU] via SUBCUTANEOUS

## 2022-11-09 MED ORDER — INSULIN ASPART 100 UNIT/ML IJ SOLN
10.0000 [IU] | Freq: Once | INTRAMUSCULAR | Status: AC
  Administered 2022-11-09: 10 [IU] via SUBCUTANEOUS

## 2022-11-09 MED ORDER — INSULIN ASPART 100 UNIT/ML IJ SOLN: 0.0000 [IU] | Freq: Three times a day (TID) | INTRAMUSCULAR | Status: DC

## 2022-11-09 MED ORDER — INSULIN ASPART 100 UNIT/ML IJ SOLN
0.0000 [IU] | Freq: Three times a day (TID) | INTRAMUSCULAR | Status: DC
  Administered 2022-11-10: 8 [IU] via SUBCUTANEOUS
  Administered 2022-11-10: 3 [IU] via SUBCUTANEOUS
  Administered 2022-11-11: 15 [IU] via SUBCUTANEOUS
  Administered 2022-11-11: 3 [IU] via SUBCUTANEOUS
  Administered 2022-11-12: 15 [IU] via SUBCUTANEOUS
  Administered 2022-11-12: 3 [IU] via SUBCUTANEOUS

## 2022-11-09 MED ORDER — WARFARIN SODIUM 2.5 MG PO TABS
2.5000 mg | ORAL_TABLET | Freq: Once | ORAL | Status: AC
  Administered 2022-11-09: 2.5 mg via ORAL
  Filled 2022-11-09: qty 1

## 2022-11-09 MED ORDER — INSULIN ASPART 100 UNIT/ML IJ SOLN
10.0000 [IU] | Freq: Three times a day (TID) | INTRAMUSCULAR | Status: DC
  Administered 2022-11-09 – 2022-11-12 (×8): 10 [IU] via SUBCUTANEOUS

## 2022-11-09 NOTE — Inpatient Diabetes Management (Signed)
Inpatient Diabetes Program Recommendations  AACE/ADA: New Consensus Statement on Inpatient Glycemic Control (2015)  Target Ranges:  Prepandial:   less than 140 mg/dL      Peak postprandial:   less than 180 mg/dL (1-2 hours)      Critically ill patients:  140 - 180 mg/dL   Lab Results  Component Value Date   GLUCAP 219 (H) 11/09/2022   HGBA1C 8.3 (A) 10/18/2022    Review of Glycemic Control  CP DM type 1, Omnipod insulin pump Sees Whitney reardon NP last visit 6/17 Target 110 CHO 1:5 Sensitivity 1:38 Avg basal insulin delivery on Auto mode approx 30 units in 24 hour period conservatively  A1c 8% on 6/17  Current orders for inpatient diabetes management: Insulin pump order set.   Glucose trends at goal. Omnipod insulin pump site left abd with site due to be changed today. Omnipod had a connection error around lunchtime. Pt reports not being able to bolus for glucose at lunchtime.  Husband currently getting insulin pump supplies at home. Pt to replace pump site and bolus to correct her glucose trends.   Thanks  Christena Deem RN, MSN, BC-ADM Inpatient Diabetes Coordinator Team Pager [phone] (8a-5p)

## 2022-11-09 NOTE — Progress Notes (Addendum)
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    Goals of Care:  Code Status: DNR   DVT prophylaxis: Warfarin  Interim Hx: No acute events recorded overnight.  Afebrile.  Vital signs stable.  Has been seen by [facility] as well as Diabetes Coordinator.  No further evaluation planned per cardiology at this time.  The patient states her chest pain has resolved with the adjustment in her long-acting nitrate.  She has not yet ambulated to a significant extent.  Her creatinine has increased on serial exam today, coincident with increased diuresis.  Assessment & Plan:  Known multivessel CAD -recurrent USAP/NSTEMI Cardiac cath 07/26/2022 noted the following:       Ost LAD to Prox LAD lesion is 75% stenosed.   Prox LAD to Mid LAD lesion is 100% stenosed.   Prox Cx to Dist Cx lesion is 35% stenosed with 35% stenosed side branch in LPAV.   Prox RCA lesion is 95% stenosed.   Mid RCA lesion is 95% stenosed.   Dist RCA lesion is 100% stenosed. The patient has previously been deemed an inappropriate candidate for PCI or CABG with medical therapy being the only option - given resolution of pain Cardiology recommended discontinuing IV heparin - already on Plavix, statin, beta-blocker, and Ranexa at time of admission - troponin peaked at 990 - long-acting nitrate dose increased by Cardiology with resolution of chest pain   Acute on chronic systolic and diastolic heart failure/Ischemic cardiomyopathy   Not tolerating IV diuresis well with acute increasing creatinine -not grossly volume overloaded on exam today though she does have some peripheral edema -hold Lasix today and recheck renal function in a.m. -if creatinine continues to climb may need to evaluate further with renal ultrasound -if creatinine has stabilized should be able to discharge patient home on her previous diuretic dose  Filed Weights   11/07/22 1117 11/08/22 0552 11/09/22 0530  Weight: 101.5 kg 102.2 kg 103.2 kg    Acute kidney injury on CKD stage IV Creatinine climbing - check renal ultrasound if upward trend continues to rule out obstruction/hydro, but suspect this is just intolerance to increased dose of diuretic -hold Lasix for today and monitor -as noted above will consider discharge home 7/10 if creatinine remains stable   History of suspected thrombotic CVA and right upper extremity brachial artery embolus 2023 On chronic warfarin therapy - scheduled to undergo hematologic evaluation at [facility] to determine if long-term anticoagulation appropriate - continue warfarin at this time   PVD Status post angioplasty with stenting of the left SFA February 2024   HTN Blood pressure now well-controlled with adjustment in medications over last 24 hours   History of CVA with chronic left hemiparesis Clinically stable on exam   DM1 on insulin pump Utilizes insulin pump at home -allow patient to continue using her insulin pump during this hospital stay -CBG reasonably controlled at present -continue to monitor trend   HLD Continue usual medical therapy   Obesity - Body mass index is 37.24 kg/m.   Family Communication:  No family present at time of exam Disposition: Anticipate discharge home when angina better controlled   Objective: Blood pressure 119/80, pulse 84, temperature 98.5 F (36.9 C), temperature source Oral, resp. rate 18, height 5\' 5"  (1.651 m), weight 103.2 kg, last menstrual period 11/05/2022, SpO2 95  %.  Intake/Output Summary (Last 24 hours) at 11/09/2022 0828 Last data filed at 11/09/2022 0809 Gross per 24 hour  Intake 360 ml  Output 400 ml  Net -40 ml    Filed Weights   11/07/22 1117 11/08/22 0552 11/09/22 0530  Weight: 101.5 kg 102.2 kg 103.2 kg    Examination: General: No acute respiratory distress Lungs: Clear to auscultation bilaterally -no wheezing Cardiovascular: Regular rate and rhythm without murmur  Abdomen: Nontender, nondistended, soft, bowel sounds positive, no rebound, no ascites, no appreciable mass Extremities: 1+ bilateral lower extremity edema to knees bilaterally  CBC: Recent Labs  Lab 11/07/22 1129 11/08/22 0105  WBC 10.2 10.9*  HGB 10.2* 10.3*  HCT 33.9* 32.2*  MCV 85.2 83.9  PLT 446* 420*    Basic Metabolic Panel: Recent Labs  Lab 11/07/22 1129 11/08/22 0105 11/09/22 0032  NA 138 134* 134*  K 4.6 4.3 4.3  CL 103 102 102  CO2 24 21* 23  GLUCOSE 183* 220* 267*  BUN 31* 30* 38*  CREATININE 2.76* 2.83* 3.22*  CALCIUM 8.7* 8.5* 8.2*  MG  --   --  2.3    GFR: Estimated Creatinine Clearance: 27.7 mL/min (A) (by C-G formula based on SCr of 3.22 mg/dL (H)).   Scheduled Meds:  amLODipine  5 mg Oral Daily   clopidogrel  75 mg Oral Daily   furosemide  40 mg Intravenous BID   hydrALAZINE  25 mg Oral Q8H   insulin pump   Subcutaneous Q4H   isosorbide mononitrate  120 mg Oral Daily   levothyroxine  125 mcg Oral Q0600   metoprolol succinate  100 mg Oral Daily   multivitamin with minerals  1 tablet Oral Daily   ranolazine  1,000 mg Oral BID   [START ON 11/10/2022] scopolamine  1 patch Transdermal Q72H   tiZANidine  4 mg Oral QHS   traZODone  150 mg Oral QHS   Warfarin - Pharmacist Dosing Inpatient   Does not apply q1600      LOS: 1 day   Lonia Blood, MD [facility] Office  [phone] Pager - Text Page per Amion  If 7PM-7AM, please contact night-coverage per Amion 11/09/2022, 8:28 AM

## 2022-11-09 NOTE — Progress Notes (Signed)
   Heart Failure Stewardship Pharmacist Progress Note   PCP: Billie Lade, MD PCP-Cardiologist: Dina Rich, MD    HPI:  42 yo F with PMH of CHF, PVD, type 1 diabetes, hypothyroidism, HLD, CKD IV, CAD, and stroke.   Admitted 07/2022 with NSTEMI in the setting of diabetic foot infection, requiring left fifth metatarsal excision. Underwent cardiac cath and found multivessel CAD without clear target for revascularization. She was recommended medical management. EF at that time showed LVEF 30-35% (was 35-40% in 05/2020) and mildly reduced RV.   Presented to the ED with chest pain, shortness of breath, orthopnea, LE edema, and N/V. CXR without acute finding. BNP elevated. Given previous heart cath findings/recommendations and advanced renal disease, not pursuing additional cath at this time.   Current HF Medications: Beta Blocker: metoprolol XL 100 mg daily Other: hydralazine 25 mg TID + Imdur 120 mg daily  Prior to admission HF Medications: Diuretic: furosemide 40 mg daily PRN Beta blocker: metoprolol XL 100 mg daily Other: hydralazine 25 mg BID + Imdur 40 mg BID  Pertinent Lab Values: Serum creatinine 3.22, BUN 38, Potassium 4.3, Sodium 134, Magnesium 2.3, BNP 1973.2, A1c 8.3   Vital Signs: Weight: 227 lbs (admission weight: 223 lbs) Blood pressure: 100-120/80s  Heart rate: 80s  I/O: incomplete  Medication Assistance / Insurance Benefits Check: Does the patient have prescription insurance?  Yes Type of insurance plan: [facility] commercial insurance  Outpatient Pharmacy:  Prior to admission outpatient pharmacy: [facility]  Is the patient willing to use [facility] pharmacy at discharge? Yes Is the patient willing to transition their outpatient pharmacy to utilize a [facility] outpatient pharmacy?   No - has the option to get delivery service with [facility]     Assessment: 1. Acute on chronic systolic CHF (LVEF 30-35%), due to ICM. NYHA class II symptoms. - Off  IV lasix with creatinine bump. Strict I/Os and daily weights. Keep K>4 and Mg>2. - Continue metoprolol XL 100 mg daily - Continue hydralazine 25 mg TID and Imdur 120 mg daily - GDMT limited by advanced CKD - No SGLT2i with type 1 diabetes    Plan: 1) Medication changes recommended at this time: - Agree with changes  2) Patient assistance: - None pending  3)  Education  - Patient has been educated on current HF medications and potential additions to HF medication regimen - Patient verbalizes understanding that over the next few months, these medication doses may change and more medications may be added to optimize HF regimen - Patient has been educated on basic disease state pathophysiology and goals of therapy   Sharen Hones, PharmD, BCPS Heart Failure Stewardship Pharmacist Phone [phone]

## 2022-11-09 NOTE — Progress Notes (Signed)
Progress Note  Patient Name: Lauren [name] Date of Encounter: 11/09/2022  Primary Cardiologist:   Dina Rich, MD   Subjective   No chest pain.  Breathing OK.    Inpatient Medications    Scheduled Meds:  clopidogrel  75 mg Oral Daily   hydrALAZINE  25 mg Oral Q8H   insulin pump   Subcutaneous Q4H   isosorbide mononitrate  120 mg Oral Daily   levothyroxine  125 mcg Oral Q0600   metoprolol succinate  100 mg Oral Daily   multivitamin with minerals  1 tablet Oral Daily   ranolazine  1,000 mg Oral BID   [START ON 11/10/2022] scopolamine  1 patch Transdermal Q72H   tiZANidine  4 mg Oral QHS   traZODone  150 mg Oral QHS   Warfarin - Pharmacist Dosing Inpatient   Does not apply q1600   Continuous Infusions:  PRN Meds: acetaminophen, calcium carbonate, nitroGLYCERIN, ondansetron (ZOFRAN) IV, oxyCODONE-acetaminophen, senna-docusate   Vital Signs    Vitals:   11/08/22 1943 11/08/22 2344 11/09/22 0530 11/09/22 0724  BP: 113/61 106/71 100/60 119/80  Pulse: 76 80 88 84  Resp: 18 18 18 18   Temp: 98.1 F (36.7 C) 98.1 F (36.7 C) 98 F (36.7 C) 98.5 F (36.9 C)  TempSrc: Oral Oral Oral Oral  SpO2: 99% 98% 100% 95%  Weight:   103.2 kg   Height:        Intake/Output Summary (Last 24 hours) at 11/09/2022 0949 Last data filed at 11/09/2022 0809 Gross per 24 hour  Intake 360 ml  Output 400 ml  Net -40 ml   Filed Weights   11/07/22 1117 11/08/22 0552 11/09/22 0530  Weight: 101.5 kg 102.2 kg 103.2 kg    Telemetry    NSR - Personally Reviewed  ECG    NSR - Personally Reviewed  Physical Exam   GEN: No  acute distress.   Neck: No  JVD Cardiac: RRR, no murmurs, rubs, or gallops.  Respiratory: Clear   to auscultation bilaterally. GI: Soft, nontender, non-distended, normal bowel sounds  MS:  No edema; No deformity. Neuro:   Nonfocal  Psych: Oriented and appropriate    Labs    Chemistry Recent Labs  Lab 11/07/22 1129 11/08/22 0105 11/09/22 0032  NA  138 134* 134*  K 4.6 4.3 4.3  CL 103 102 102  CO2 24 21* 23  GLUCOSE 183* 220* 267*  BUN 31* 30* 38*  CREATININE 2.76* 2.83* 3.22*  CALCIUM 8.7* 8.5* 8.2*  PROT  --  6.1*  --   ALBUMIN  --  2.8*  --   AST  --  92*  --   ALT  --  59*  --   ALKPHOS  --  111  --   BILITOT  --  0.7  --   GFRNONAA 22* 21* 18*  ANIONGAP 11 11 9      Hematology Recent Labs  Lab 11/07/22 1129 11/08/22 0105  WBC 10.2 10.9*  RBC 3.98 3.84*  HGB 10.2* 10.3*  HCT 33.9* 32.2*  MCV 85.2 83.9  MCH 25.6* 26.8  MCHC 30.1 32.0  RDW 17.9* 18.2*  PLT 446* 420*    Cardiac EnzymesNo results for input(s): "TROPONINI" in the last 168 hours. No results for input(s): "TROPIPOC" in the last 168 hours.   BNP Recent Labs  Lab 11/07/22 1129  BNP 1,973.2*     DDimer No results for input(s): "DDIMER" in the last 168 hours.   Radiology  DG Chest 2 View  Result Date: 11/07/2022 CLINICAL DATA:  Chest pain. EXAM: CHEST - 2 VIEW COMPARISON:  10/11/2022.  CT, 08/12/2022. FINDINGS: Cardiac silhouette is mildly enlarged. No mediastinal or hilar masses. No evidence of adenopathy. Clear lungs.  No pleural effusion or pneumothorax. Skeletal structures are intact. IMPRESSION: No active cardiopulmonary disease. Electronically Signed   By: Amie Portland M.D.   On: 11/07/2022 12:22    Cardiac Studies   Cath 07/26/22  Diagnostic Dominance: Co-dominant     Patient Profile     42 y.o. 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   Assessment & Plan    NSTEMI/Multivessel CAD :    No change in meds today.  Titrated Imdur yesterday.    Acute on chronic systolic and diastolic heart failure/Ischemic cardiomyopathy:  EF 30 - 35%.  Intake and output incomplete.   Home on PRN Lasix as she was doing before with close follow up of her renal function.    HTN:  BPs is labile.   As an out patient they can titrate meds further with possible discontinuation of the Norvasc and up titration of the hydralazine.   CKD IV:    Followed by nephrology as an out patient.   History of CVA/PAD/DM    For questions or updates, please contact [facility] Please consult [email or web address] for contact info under Cardiology/STEMI.   Signed, Rollene Rotunda, MD  11/09/2022, 9:49 AM

## 2022-11-09 NOTE — Progress Notes (Signed)
ANTICOAGULATION CONSULT NOTE  Pharmacy Consult to resume Warfarin Indication: chest pain/ACS  Allergies  Allergen Reactions   Visipaque [Iodixanol] Nausea And Vomiting    Patient had vagal response during procedure became hypertensive, and bradycardic. CO2 used during procedure prior to contrast being used, this may be cause as well. Recommended premedicating prior to contrast being used in the future.    Wellbutrin [Bupropion] Hives   Gabapentin     dizzy   Cefepime Rash    Tolerates penicilllin   Ciprofloxacin Hcl Hives and Rash    Hives/rash at injection site; 01/15/22 tolerated IV cipro   Tape Rash    Patient Measurements: Height: 5\' 5"  (165.1 cm) Weight: 103.2 kg (227 lb 8.2 oz) IBW/kg (Calculated) : 57 Heparin Dosing Weight: 80.3 kg  Vital Signs: Temp: 98.5 F (36.9 C) (07/09 0724) Temp Source: Oral (07/09 0724) BP: 119/80 (07/09 0724) Pulse Rate: 84 (07/09 0724)  Labs: Recent Labs    11/07/22 1129 11/07/22 1320 11/07/22 1632 11/07/22 1814 11/08/22 0105 11/09/22 0032  HGB 10.2*  --   --   --  10.3*  --   HCT 33.9*  --   --   --  32.2*  --   PLT 446*  --   --   --  420*  --   LABPROT 32.1*  --   --   --  29.7* 29.9*  INR 3.1*  --   --   --  2.8* 2.8*  CREATININE 2.76*  --   --   --  2.83* 3.22*  TROPONINIHS 923* 990* 961* 856*  --   --      Estimated Creatinine Clearance: 27.7 mL/min (A) (by C-G formula based on SCr of 3.22 mg/dL (H)).   Medical History: Past Medical History:  Diagnosis Date   Anemia    CAD (coronary artery disease)    a. s/p cath in 03/2014 showing 30% mid-LAD, moderate to severe disease along small D1, patent LCx, moderate to severe distal OM2 stenosis and moderate diffuse diease along RCA not amenable to PCI   CHF (congestive heart failure) (HCC)    a. EF 55-60% in 12/2019 b. EF at 35-40% by echo in 05/2020   CKD (chronic kidney disease) stage 4, GFR 15-29 ml/min (HCC)    Diabetes mellitus without complication (HCC)    Myocardial  infarction (HCC)    Neuropathy    PVD (peripheral vascular disease) (HCC)    Stroke (HCC) 01/2022   L hand weakness    Assessment: 42 yof with a history of PVD, T1DM, hypothyroidism, CKD, unstable angina, osteomyelitis of the left foot, MCA stroke. Patient presented on 11/07/22 with chest pain. Heparin per pharmacy consult was ordered for chest pain/ACS ans PTA warfarin held due to potential need for cardiac procedures.   Cardiology consulted, noted that patient has known multivessel CAD without targets for revascularization, and that her risk of renal injury w/coronary angio outweighs benefit.  Thus Heparin consult discontinued (no heparin started due to INR >3 on 11/07/22). Pharmacy consulted to resume Warfarin on 11/08/22.   Patient is on warfarin prior to arrival for stroke. Home dose is 5mg  daily per last anticoag visit note and per PTA medication history per patient's spouse report. Last dose 7/6 pm.  INR  is 2.8 today, therapeutic. Hgb 10s stable; plt 400s stable. No bleeding noted.   Goal of Therapy:  INR 2-3 Monitor platelets by anticoagulation protocol: Yes   Plan:  Give Warfarin 2.5mg  today x1 Monitor daily PT/INR.  Andreas Ohm, PharmD Pharmacy Resident  11/09/2022 10:57 AM

## 2022-11-09 NOTE — Progress Notes (Signed)
Heart Failure Navigator Progress Note  Assessed for Heart & Vascular TOC clinic readiness.  Patient EF 30-35%, patient declined HF TOC appointment. Stated she would like to stay with Dr. Wyline Mood in [place]. Has a appointment on 12/09/2022.Marland Kitchen Education was done on the sign and symptoms of heart failure, daily weights, diet/ fluid restrictions, taking all medications as prescribed and attending all medical appointments. Patient verbalized her understanding.   Navigator will sign off at this time.   Rhae Hammock, BSN, Scientist, clinical (histocompatibility and immunogenetics) Only

## 2022-11-09 NOTE — Progress Notes (Signed)
Pt. CBG 436. On call for [facility] paged to make aware.

## 2022-11-09 NOTE — TOC Initial Note (Signed)
Transition of Care [facility]) - Initial/Assessment Note    Patient Details  Name: Lauren [name] MRN: [id number] Date of Birth: 30-Mar-1981  Transition of Care [facility]) CM/SW Contact:    Leone Haven, RN Phone Number: 11/09/2022, 4:12 PM  Clinical Narrative:                 From home with spouse, indep, she has PCP, Dr. Durwin Nora, she has insurance on file.  She currently does not have any HH services in place but has had HH in the past with [facility].  She has a walker, w/chair, transfer bench, 3 n 1 at home.  She states her spouse or her sister will transport her home at dc, they are her support system.  She gets her medications from [place].  She states she will not get her insulin pump til Thursday so she will need some back up insulin if she is dc before then, will let Diabetes coordinator know this information and MD.  Expected Discharge Plan: Home/Self Care Barriers to Discharge: Continued Medical Work up   Patient Goals and CMS Choice Patient states their goals for this hospitalization and ongoing recovery are:: return home   Choice offered to / list presented to : NA      Expected Discharge Plan and Services In-house Referral: NA Discharge Planning Services: CM Consult Post Acute Care Choice: NA Living arrangements for the past 2 months: Single Family Home                 DME Arranged: N/A DME Agency: NA       HH Arranged: NA          Prior Living Arrangements/Services Living arrangements for the past 2 months: Single Family Home Lives with:: Spouse Patient language and need for interpreter reviewed:: Yes Do you feel safe going back to the place where you live?: Yes      Need for Family Participation in Patient Care: Yes (Comment) Care giver support system in place?: Yes (comment) Current home services: DME (walker, wchair, transfer bench, 3 n 1,) Criminal Activity/Legal Involvement Pertinent to Current Situation/Hospitalization: No - Comment as  needed  Activities of Daily Living Home Assistive Devices/Equipment: Eyeglasses, Grab bars around toilet, Grab bars in shower, Insulin Pump ADL Screening (condition at time of admission) Patient's cognitive ability adequate to safely complete daily activities?: Yes Is the patient deaf or have difficulty hearing?: No Does the patient have difficulty seeing, even when wearing glasses/contacts?: No Does the patient have difficulty concentrating, remembering, or making decisions?: No Patient able to express need for assistance with ADLs?: Yes Does the patient have difficulty dressing or bathing?: Yes Independently performs ADLs?: No Communication: Independent Dressing (OT): Needs assistance Is this a change from baseline?: Pre-admission baseline Grooming: Needs assistance Is this a change from baseline?: Pre-admission baseline Feeding: Needs assistance Is this a change from baseline?: Pre-admission baseline Bathing: Independent with device (comment) Toileting: Needs assistance Is this a change from baseline?: Pre-admission baseline In/Out Bed: Independent Walks in Home: Independent with device (comment) Does the patient have difficulty walking or climbing stairs?: Yes Weakness of Legs: Both Weakness of Arms/Hands: Left  Permission Sought/Granted Permission sought to share information with : Case Manager Permission granted to share information with : Yes, Verbal Permission Granted              Emotional Assessment Appearance:: Appears stated age Attitude/Demeanor/Rapport: Engaged Affect (typically observed): Appropriate Orientation: : Oriented to Self, Oriented to Place, Oriented to  Time,  Oriented to Situation Alcohol / Substance Use: Not Applicable Psych Involvement: No (comment)  Admission diagnosis:  NSTEMI (non-ST elevated myocardial infarction) (HCC) [I21.4] Chest pain due to CAD (HCC) [I25.119] Unstable angina (HCC) [I20.0] Patient Active Problem List   Diagnosis  Date Noted   Unstable angina (HCC) 11/08/2022   Chest pain due to CAD (HCC) 11/07/2022   Seizure-like activity (HCC) 10/28/2022   Acute cystitis without hematuria 10/28/2022   Dizziness 10/11/2022   Right-sided chest wall pain 10/11/2022   Injury of left thumb 10/11/2022   CKD (chronic kidney disease) 09/29/2022   History of blood clots 09/29/2022   Productive cough 09/20/2022   Abnormal TSH 09/13/2022   Lung nodule seen on imaging study 08/11/2022   Elevated troponin level not due myocardial infarction -> level still declining from previous infarction 08/03/2022   Diarrhea 08/03/2022   Nausea and vomiting 08/02/2022   Osteomyelitis (HCC) 07/20/2022   Diabetic foot infection (HCC) 07/19/2022   PVD (peripheral vascular disease) (HCC) 07/19/2022   DNR (do not resuscitate) 07/19/2022   Critical limb ischemia of left lower extremity (HCC) 06/01/2022   Open abdominal wall wound, initial encounter 04/28/2022   Insomnia 04/07/2022   Encounter for therapeutic drug monitoring 04/07/2022   Ulcer of left foot due to type 2 diabetes mellitus (HCC)    Acute on chronic combined systolic and diastolic CHF (congestive heart failure) (HCC)    Ischemic cardiomyopathy    Acute right MCA stroke (HCC) 01/28/2022   Elevated troponin    Aspiration pneumonia (HCC) 01/18/2022   Acute respiratory failure with hypoxia (HCC)    HFrEF (heart failure with reduced ejection fraction) (HCC)    Right carotid artery occlusion 01/08/2022   Internal carotid artery occlusion, 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   Menorrhagia with irregular cycle 12/15/2021   Pregnancy examination or test, negative result 12/15/2021   Encounter for IUD insertion 12/15/2021   Critical ischemia of upper extremity (HCC) 11/18/2021   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) 11/18/2021   Cellulitis of left foot 09/02/2021   Acute osteomyelitis of left foot (HCC) 09/02/2021   Foot ulcer (HCC) 08/27/2021   Sepsis (HCC) 08/27/2021   Hypokalemia  08/27/2021   Cellulitis 06/18/2021   Chronic combined systolic and diastolic heart failure (HCC) 06/18/2021   History of CVA (cerebrovascular accident) 06/18/2021   Encounter for cervical Pap smear with pelvic exam 07/23/2020   Menorrhagia with regular cycle 07/23/2020   Iron deficiency anemia due to chronic blood loss 07/23/2020   Menstrual cramps 07/23/2020   Class 2 obesity 06/18/2020   CAD (coronary artery disease) 06/18/2020   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) 06/18/2020   Angina at rest 06/18/2020   Mixed hyperlipidemia    Class 2 obesity due to excess calories without serious comorbidity with body mass index (BMI) of 39.0 to 39.9 in adult    Acute renal failure superimposed on stage 4 chronic kidney disease (HCC) 01/01/2020   Acute ischemic stroke (HCC) 01/01/2020   Tobacco abuse 01/01/2020   Cerebrovascular accident (CVA) (HCC) 12/31/2019   Left sided numbness 12/31/2019   Vitamin D deficiency 07/24/2015   Primary hypertension 03/17/2015   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 [place]  [place] [address] Phone: [phone] Fax: [phone]     Social Determinants of Health (SDOH) Social History: SDOH Screenings   Food Insecurity: No Food Insecurity (11/07/2022)  Housing: Patient Declined (11/09/2022)  Transportation Needs: No Transportation Needs (11/09/2022)  Utilities: Not At Risk (11/07/2022)  Alcohol Screen: Low Risk  (11/09/2022)  Depression (PHQ2-9): Medium Risk (10/28/2022)  Financial Resource Strain: Low Risk  (11/09/2022)  Physical Activity: Inactive (03/03/2022)  Social Connections: Socially Integrated (03/03/2022)  Stress: No Stress Concern Present (03/03/2022)  Recent  Concern: Stress - Stress Concern Present (12/22/2021)  Tobacco Use: Medium Risk (11/07/2022)   SDOH Interventions: Housing Interventions: Intervention Not Indicated Transportation Interventions: Intervention Not Indicated Alcohol Usage Interventions: Intervention Not Indicated (Score <7) Financial Strain Interventions: Intervention Not Indicated   Readmission Risk Interventions    08/05/2022   11:08 AM  Readmission Risk Prevention Plan  Transportation Screening Complete  Medication Review (RN Care Manager) Complete  PCP or Specialist appointment within 3-5 days of discharge Complete  Palliative Care Screening Not Applicable  Skilled Nursing Facility Not Applicable

## 2022-11-09 NOTE — Plan of Care (Signed)
  Problem: Activity: Goal: Ability to tolerate increased activity will improve Outcome: Progressing   Problem: Education: Goal: Understanding of cardiac disease, CV risk reduction, and recovery process will improve Outcome: Not Progressing

## 2022-11-09 NOTE — Plan of Care (Signed)
  Problem: Education: Goal: Understanding of cardiac disease, CV risk reduction, and recovery process will improve Outcome: Progressing Goal: Individualized Educational Video(s) Outcome: Progressing   Problem: Activity: Goal: Ability to tolerate increased activity will improve Outcome: Progressing   Problem: Cardiac: Goal: Ability to achieve and maintain adequate cardiovascular perfusion will improve Outcome: Progressing   Problem: Health Behavior/Discharge Planning: Goal: Ability to safely manage health-related needs after discharge will improve Outcome: Progressing   Problem: Education: Goal: Knowledge of General Education information will improve Description: Including pain rating scale, medication(s)/side effects and non-pharmacologic comfort measures Outcome: Progressing   Problem: Health Behavior/Discharge Planning: Goal: Ability to manage health-related needs will improve Outcome: Progressing   Problem: Clinical Measurements: Goal: Ability to maintain clinical measurements within normal limits will improve Outcome: Progressing Goal: Will remain free from infection Outcome: Progressing Goal: Diagnostic test results will improve Outcome: Progressing Goal: Respiratory complications will improve Outcome: Progressing Goal: Cardiovascular complication will be avoided Outcome: Progressing   Problem: Activity: Goal: Risk for activity intolerance will decrease Outcome: Progressing   Problem: Nutrition: Goal: Adequate nutrition will be maintained Outcome: Progressing   Problem: Coping: Goal: Level of anxiety will decrease Outcome: Progressing   Problem: Elimination: Goal: Will not experience complications related to bowel motility Outcome: Progressing Goal: Will not experience complications related to urinary retention Outcome: Progressing   Problem: Pain Managment: Goal: General experience of comfort will improve Outcome: Progressing   Problem:  Safety: Goal: Ability to remain free from injury will improve Outcome: Progressing   Problem: Skin Integrity: Goal: Risk for impaired skin integrity will decrease Outcome: Progressing   

## 2022-11-10 ENCOUNTER — Encounter: Payer: Self-pay | Admitting: Physical Medicine & Rehabilitation

## 2022-11-10 ENCOUNTER — Other Ambulatory Visit (HOSPITAL_COMMUNITY): Payer: Self-pay

## 2022-11-10 DIAGNOSIS — I25119 Atherosclerotic heart disease of native coronary artery with unspecified angina pectoris: Secondary | ICD-10-CM | POA: Diagnosis not present

## 2022-11-10 LAB — CBC
HCT: 28.8 % — ABNORMAL LOW (ref 36.0–46.0)
Hemoglobin: 8.9 g/dL — ABNORMAL LOW (ref 12.0–15.0)
MCH: 26 pg (ref 26.0–34.0)
MCHC: 30.9 g/dL (ref 30.0–36.0)
MCV: 84.2 fL (ref 80.0–100.0)
Platelets: 342 10*3/uL (ref 150–400)
RBC: 3.42 MIL/uL — ABNORMAL LOW (ref 3.87–5.11)
RDW: 18 % — ABNORMAL HIGH (ref 11.5–15.5)
WBC: 8.8 10*3/uL (ref 4.0–10.5)
nRBC: 0 % (ref 0.0–0.2)

## 2022-11-10 LAB — BASIC METABOLIC PANEL
Anion gap: 6 (ref 5–15)
BUN: 46 mg/dL — ABNORMAL HIGH (ref 6–20)
CO2: 22 mmol/L (ref 22–32)
Calcium: 8.1 mg/dL — ABNORMAL LOW (ref 8.9–10.3)
Chloride: 101 mmol/L (ref 98–111)
Creatinine, Ser: 3.21 mg/dL — ABNORMAL HIGH (ref 0.44–1.00)
GFR, Estimated: 18 mL/min — ABNORMAL LOW (ref >60)
Glucose, Bld: 394 mg/dL — ABNORMAL HIGH (ref 70–99)
Potassium: 4.5 mmol/L (ref 3.5–5.1)
Sodium: 129 mmol/L — ABNORMAL LOW (ref 135–145)

## 2022-11-10 LAB — GLUCOSE, CAPILLARY
Glucose-Capillary: 240 mg/dL — ABNORMAL HIGH (ref 70–99)
Glucose-Capillary: 282 mg/dL — ABNORMAL HIGH (ref 70–99)
Glucose-Capillary: 185 mg/dL — ABNORMAL HIGH (ref 70–99)
Glucose-Capillary: 60 mg/dL — ABNORMAL LOW (ref 70–99)
Glucose-Capillary: 129 mg/dL — ABNORMAL HIGH (ref 70–99)
Glucose-Capillary: 368 mg/dL — ABNORMAL HIGH (ref 70–99)

## 2022-11-10 LAB — PROTIME-INR
INR: 2.4 — ABNORMAL HIGH (ref 0.8–1.2)
Prothrombin Time: 26.4 seconds — ABNORMAL HIGH (ref 11.4–15.2)

## 2022-11-10 MED ORDER — ATORVASTATIN CALCIUM 40 MG PO TABS
40.0000 mg | ORAL_TABLET | Freq: Every day | ORAL | Status: DC
  Administered 2022-11-10 – 2022-11-12 (×3): 40 mg via ORAL
  Filled 2022-11-10 (×3): qty 1

## 2022-11-10 MED ORDER — POLYETHYLENE GLYCOL 3350 17 G PO PACK
17.0000 g | PACK | Freq: Every day | ORAL | Status: DC
  Administered 2022-11-10 – 2022-11-12 (×3): 17 g via ORAL
  Filled 2022-11-10 (×3): qty 1

## 2022-11-10 MED ORDER — WARFARIN SODIUM 5 MG PO TABS
5.0000 mg | ORAL_TABLET | Freq: Every day | ORAL | Status: DC
  Administered 2022-11-10 – 2022-11-11 (×2): 5 mg via ORAL
  Filled 2022-11-10 (×2): qty 1

## 2022-11-10 NOTE — Inpatient Diabetes Management (Signed)
Inpatient Diabetes Program Recommendations  AACE/ADA: New Consensus Statement on Inpatient Glycemic Control (2015)  Target Ranges:  Prepandial:   less than 140 mg/dL      Peak postprandial:   less than 180 mg/dL (1-2 hours)      Critically ill patients:  140 - 180 mg/dL   Lab Results  Component Value Date   GLUCAP 185 (H) 11/10/2022   HGBA1C 8.3 (A) 10/18/2022    Review of Glycemic Control  Diabetes history: type 1 (diagnosed at age 42) Outpatient Diabetes medications: Omnipod insulin pump Current orders for Inpatient glycemic control: Levemir 26 units daily, Novolog 0-15 units correction scale TID, Novolog 0-5 units HS scale, Novolog 10 units TID  Inpatient Diabetes Program Recommendations:   Spoke with patient at the bedside. Patient states that she was diagnosed with diabetes at age 30. She has been on the Omnipod insulin pump for about 6 months. States that she is getting a new pump mailed to her. States that she would like to have some backup insulin pens. She has been using Novolin NPH and Regular for backup. Noted that a benefit check was done and Lantus Solostar pen and Novolog Flexpen are both $0.00 copay. Diabetes Recommendations for discharge meds have been entered; may need to adjust dosages for discharge.  Reminded patient that if she takes Levemir in the morning here at the hospital, she will need to wait until evening to start her pump back after she gets home.   Smith Mince RN BSN CDE Diabetes Coordinator Pager: [phone]  8am-5pm

## 2022-11-10 NOTE — Progress Notes (Signed)
ANTICOAGULATION CONSULT NOTE  Pharmacy Consult to resume Warfarin Indication: chest pain/ACS  Allergies  Allergen Reactions   Visipaque [Iodixanol] Nausea And Vomiting    Patient had vagal response during procedure became hypertensive, and bradycardic. CO2 used during procedure prior to contrast being used, this may be cause as well. Recommended premedicating prior to contrast being used in the future.    Wellbutrin [Bupropion] Hives   Gabapentin     dizzy   Cefepime Rash    Tolerates penicilllin   Ciprofloxacin Hcl Hives and Rash    Hives/rash at injection site; 01/15/22 tolerated IV cipro   Tape Rash    Patient Measurements: Height: 5\' 5"  (165.1 cm) Weight: 104.3 kg (229 lb 14.4 oz) IBW/kg (Calculated) : 57 Heparin Dosing Weight: 80.3 kg  Vital Signs: Temp: 97.7 F (36.5 C) (07/10 0709) Temp Source: Oral (07/10 0709) BP: 110/67 (07/10 1113) Pulse Rate: 83 (07/10 1113)  Labs: Recent Labs    11/07/22 1320 11/07/22 1632 11/07/22 1814 11/08/22 0105 11/09/22 0032 11/10/22 0131  HGB  --   --   --  10.3*  --  8.9*  HCT  --   --   --  32.2*  --  28.8*  PLT  --   --   --  420*  --  342  LABPROT  --   --   --  29.7* 29.9* 26.4*  INR  --   --   --  2.8* 2.8* 2.4*  CREATININE  --   --   --  2.83* 3.22* 3.21*  TROPONINIHS 990* 961* 856*  --   --   --      Estimated Creatinine Clearance: 27.9 mL/min (A) (by C-G formula based on SCr of 3.21 mg/dL (H)).   Medical History: Past Medical History:  Diagnosis Date   Anemia    CAD (coronary artery disease)    a. s/p cath in 03/2014 showing 30% mid-LAD, moderate to severe disease along small D1, patent LCx, moderate to severe distal OM2 stenosis and moderate diffuse diease along RCA not amenable to PCI   CHF (congestive heart failure) (HCC)    a. EF 55-60% in 12/2019 b. EF at 35-40% by echo in 05/2020   CKD (chronic kidney disease) stage 4, GFR 15-29 ml/min (HCC)    Diabetes mellitus without complication (HCC)    Myocardial  infarction (HCC)    Neuropathy    PVD (peripheral vascular disease) (HCC)    Stroke (HCC) 01/2022   L hand weakness    Assessment: 42 yof with a history of PVD, T1DM, hypothyroidism, CKD, unstable angina, osteomyelitis of the left foot, MCA stroke. Patient presented on 11/07/22 with chest pain. Heparin per pharmacy consult was ordered for chest pain/ACS and PTA warfarin held due to potential need for cardiac procedures.  Patient is on warfarin prior to arrival for stroke. Home dose is 5mg  daily per last anticoag visit note and per PTA medication history per patient's spouse report. Last dose 7/6 pm.  Warfarin dose held on 7/7 due to potential need for procedure.   Cardiology consulted, noted that patient has known multivessel CAD without targets for revascularization, and that her risk of renal injury w/coronary angio outweighs benefit.  Thus Heparin consult was discontinued (no heparin started due to INR >3 on 11/07/22 and pharmacy consulted to resume Warfarin on 11/08/22.   INR  is 2.4 today,  remains therapeutic.   Hgb 10s>>8.9 ; pltc within normal limit/stable. No bleeding reported.   Goal of Therapy:  INR 2-3 Monitor platelets by anticoagulation protocol: Yes   Plan:  Warfarin 5mg  daily (PTA dose) Monitor daily PT/INR.  If INR remains therapeutic and stable will consider reducing INR checks.   Thank you for allowing pharmacy to be part of this patients care team. Noah Delaine, RPh Clinical Pharmacist 11/10/2022 11:56 AM  Please check AMION for all [facility] Pharmacy phone numbers After 10:00 PM, call Main Pharmacy [phone]

## 2022-11-10 NOTE — Progress Notes (Signed)
   Heart Failure Stewardship Pharmacist Progress Note   PCP: Billie Lade, MD PCP-Cardiologist: Dina Rich, MD    HPI:  42 yo F with PMH of CHF, PVD, type 1 diabetes, hypothyroidism, HLD, CKD IV, CAD, and stroke.   Admitted 07/2022 with NSTEMI in the setting of diabetic foot infection, requiring left fifth metatarsal excision. Underwent cardiac cath and found multivessel CAD without clear target for revascularization. She was recommended medical management. EF at that time showed LVEF 30-35% (was 35-40% in 05/2020) and mildly reduced RV.   Presented to the ED with chest pain, shortness of breath, orthopnea, LE edema, and N/V. CXR without acute finding. BNP elevated. Given previous heart cath findings/recommendations and advanced renal disease, not pursuing additional cath at this time.   Current HF Medications: Beta Blocker: metoprolol XL 100 mg daily Other: hydralazine 25 mg TID + Imdur 120 mg daily  Prior to admission HF Medications: Diuretic: furosemide 40 mg daily PRN Beta blocker: metoprolol XL 100 mg daily Other: hydralazine 25 mg BID + Imdur 40 mg BID  Pertinent Lab Values: Serum creatinine 3.21, BUN 46, Potassium 4.5, Sodium 129, Magnesium 2.3, BNP 1973.2, A1c 8.3   Vital Signs: Weight: 229 lbs (admission weight: 223 lbs) Blood pressure: 120-130/60s  Heart rate: 80s  I/O: incomplete  Medication Assistance / Insurance Benefits Check: Does the patient have prescription insurance?  Yes Type of insurance plan: [employer] commercial insurance  Outpatient Pharmacy:  Prior to admission outpatient pharmacy: [facility]  Is the patient willing to use [facility] TOC pharmacy at discharge? Yes Is the patient willing to transition their outpatient pharmacy to utilize a [facility] outpatient pharmacy?   No - has the option to get delivery service with [facility]     Assessment: 1. Acute on chronic systolic CHF (LVEF 30-35%), due to ICM. NYHA class II symptoms. - Off  IV lasix with creatinine bump, creatinine stable from yesterday. Weight up 2 lbs with LE edema. Consider resuming lasix. Strict I/Os and daily weights. Keep K>4 and Mg>2. - Continue metoprolol XL 100 mg daily - Continue hydralazine 25 mg TID and Imdur 120 mg daily - GDMT limited by advanced CKD - No SGLT2i with type 1 diabetes    Plan: 1) Medication changes recommended at this time: - Add lasix  2) Patient assistance: - None pending  3)  Education  - Patient has been educated on current HF medications and potential additions to HF medication regimen - Patient verbalizes understanding that over the next few months, these medication doses may change and more medications may be added to optimize HF regimen - Patient has been educated on basic disease state pathophysiology and goals of therapy   Sharen Hones, PharmD, BCPS Heart Failure Stewardship Pharmacist Phone [phone]

## 2022-11-10 NOTE — TOC Benefit Eligibility Note (Signed)
Pharmacy Patient Advocate Encounter  Insurance verification completed.    The patient is insured through [employer] test claim for Lantus Pen and the current 30 day co-pay is $0.00.  Ran test claim for Novolog FLexPen and the current 30 day co-pay is $0.00.   This test claim was processed through [facility]- copay amounts may vary at other pharmacies due to pharmacy/plan contracts, or as the patient moves through the different stages of their insurance plan.    Roland Earl, CPHT Pharmacy Patient Advocate Specialist [facility] Pharm[facility] Patient Advocate Team Direct Number: [phone]  Fax: [phone]

## 2022-11-10 NOTE — Progress Notes (Signed)
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  Patient was admitted for CAD, CHF exacerbation.  Cardiology consulted.  New events last 24 hours / Subjective: Patient sitting at the side of bed, admits to some nausea, which is chronic in nature.  Assessment & Plan:   Principal Problem:   Chest pain due to CAD [facility]) Active Problems:   Mixed hyperlipidemia   Primary hypertension   Hypothyroidism   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)   CAD (coronary artery disease)   History of CVA (cerebrovascular accident)   Acute renal failure superimposed on stage 4 chronic kidney disease (HCC)   Ischemic cardiomyopathy   Acute on chronic combined systolic and diastolic CHF (congestive heart failure) (HCC)   PVD (peripheral vascular disease) (HCC)   DNR (do not resuscitate)   Unstable angina (HCC)  NSTEMI, CAD -Heart cath March 2024 showed two-vessel occlusive CAD -Appreciate cardiology, remains on Plavix, Lipitor, Toprol, Ranexa, Imdur -No complaints of chest pain today  Acute on chronic systolic and diastolic heart failure, ischemic cardiomyopathy -IV diuresis on hold due to rise in creatinine -Repeat BMP in the morning  AKI on CKD stage IV -Baseline creatinine 2.3, now plateaued at 3.2.  If continues to trend upward, would consider renal ultrasound -IV Lasix on hold  Hypertension -Hydralazine, Imdur, Toprol -BP stable  History  CVA with chronic left hemiparesis -Warfarin  History of PVD -Status post angioplasty left SFA  Diabetes mellitus type 1 -Levemir, NovoLog, sliding scale insulin  Hypothyroidism -Synthroid  Obesity -BMI 38  DVT prophylaxis: Warfarin   Code Status: DNR Family Communication: None at bedside Disposition Plan: Home once improvement in creatinine seen Status is: Inpatient Remains inpatient appropriate because: Continue to monitor creatinine    Antimicrobials:  Anti-infectives (From admission, onward)    None        Objective: Vitals:   11/10/22 0053 11/10/22 0339 11/10/22 0709 11/10/22 1113  BP: 95/62 122/69 132/67 110/67  Pulse: 84 80 89 83  Resp: 18 18 18 18   Temp: 97.6 F (36.4 C) 98.5 F (36.9 C) 97.7 F (36.5 C)   TempSrc: Oral Oral Oral   SpO2: 93% 96% 98% 98%  Weight:  104.3 kg    Height:        Intake/Output Summary (Last 24 hours) at 11/10/2022 1136 Last data filed at 11/10/2022 0928 Gross per 24 hour  Intake 360 ml  Output 1300 ml  Net -940 ml   Filed Weights   11/08/22 0552 11/09/22 0530 11/10/22 0339  Weight: 102.2 kg 103.2 kg 104.3 kg    Examination:  General exam: Appears calm and comfortable  Respiratory system: Clear to auscultation. Respiratory effort normal. No respiratory distress. No conversational dyspnea.  Cardiovascular system: S1 & S2 heard, RRR. No murmurs.  +1 bilateral pedal edema. Gastrointestinal system: Abdomen is nondistended, soft and nontender. Normal bowel sounds heard. Central nervous system: Alert and oriented. No focal neurological deficits. Speech clear.  Extremities: Symmetric in appearance  Skin: No rashes, lesions or ulcers on exposed skin  Psychiatry: Judgement and insight appear normal. Mood & affect appropriate.   Data Reviewed: I have personally reviewed following labs and imaging studies  CBC: Recent Labs  Lab 11/07/22 1129 11/08/22 0105 11/10/22 0131  WBC 10.2 10.9* 8.8  HGB 10.2* 10.3* 8.9*  HCT  33.9* 32.2* 28.8*  MCV 85.2 83.9 84.2  PLT 446* 420* 342   Basic Metabolic Panel: Recent Labs  Lab 11/07/22 1129 11/08/22 0105 11/09/22 0032 11/10/22 0131  NA 138 134* 134* 129*  K 4.6 4.3 4.3 4.5  CL 103 102 102 101  CO2 24 21* 23 22  GLUCOSE 183* 220* 267* 394*  BUN 31* 30* 38* 46*  CREATININE 2.76* 2.83* 3.22* 3.21*  CALCIUM 8.7* 8.5* 8.2* 8.1*  MG  --   --  2.3  --    GFR: Estimated Creatinine Clearance: 27.9 mL/min (A) (by C-G formula based on SCr of 3.21 mg/dL (H)). Liver Function Tests: Recent Labs  Lab 11/08/22 0105  AST 92*  ALT 59*  ALKPHOS 111  BILITOT 0.7  PROT 6.1*  ALBUMIN 2.8*   No results for input(s): "LIPASE", "AMYLASE" in the last 168 hours. No results for input(s): "AMMONIA" in the last 168 hours. Coagulation Profile: Recent Labs  Lab 11/07/22 1129 11/08/22 0105 11/09/22 0032 11/10/22 0131  INR 3.1* 2.8* 2.8* 2.4*   Cardiac Enzymes: No results for input(s): "CKTOTAL", "CKMB", "CKMBINDEX", "TROPONINI" in the last 168 hours. BNP (last 3 results) No results for input(s): "PROBNP" in the last 8760 hours. HbA1C: No results for input(s): "HGBA1C" in the last 72 hours. CBG: Recent Labs  Lab 11/09/22 1608 11/09/22 2126 11/10/22 0421 11/10/22 0609 11/10/22 1110  GLUCAP 561* 436* 240* 282* 185*   Lipid Profile: No results for input(s): "CHOL", "HDL", "LDLCALC", "TRIG", "CHOLHDL", "LDLDIRECT" in the last 72 hours. Thyroid Function Tests: No results for input(s): "TSH", "T4TOTAL", "FREET4", "T3FREE", "THYROIDAB" in the last 72 hours. Anemia Panel: No results for input(s): "VITAMINB12", "FOLATE", "FERRITIN", "TIBC", "IRON", "RETICCTPCT" in the last 72 hours. Sepsis Labs: No results for input(s): "PROCALCITON", "LATICACIDVEN" in the last 168 hours.  No results found for this or any previous visit (from the past 240 hour(s)).    Radiology Studies: No results found.    Scheduled Meds:  atorvastatin  40 mg Oral Daily   clopidogrel  75  mg Oral Daily   hydrALAZINE  25 mg Oral Q8H   insulin aspart  0-15 Units Subcutaneous TID WC   insulin aspart  0-5 Units Subcutaneous QHS   insulin aspart  10 Units Subcutaneous TID WC   insulin detemir  26 Units Subcutaneous Daily   isosorbide mononitrate  120 mg Oral Daily   levothyroxine  125 mcg Oral Q0600   metoprolol succinate  100 mg Oral Daily   multivitamin with minerals  1 tablet Oral Daily   polyethylene glycol  17 g Oral Daily   ranolazine  1,000 mg Oral BID   scopolamine  1 patch Transdermal Q72H   tiZANidine  4 mg Oral QHS   traZODone  150 mg Oral QHS   Warfarin - Pharmacist Dosing Inpatient   Does not apply q1600   Continuous Infusions:   LOS: 2 days   Time spent: 35 minutes   Noralee Stain, DO [facility] 11/10/2022, 11:36 AM   Available via Epic secure chat 7am-7pm After these hours, please refer to coverage provider listed on [email or web address]

## 2022-11-10 NOTE — Progress Notes (Addendum)
   Patient Name: Lauren [name] Date of Encounter: 11/10/2022 [facility] Cardiologist: Dina Rich, MD   Interval Summary  .    42 y.o. female with a hx of ischemic cardiomyopathy, chronic systolic heart failure, hypertension, multivessel CAD, cryptogenic right  MCA CVA with right M1 occlusion 01/2022  s/p mechanical thrombectomy with TICI3 revascularization complicated by right ICA dissection requiring rescue telescoping stent placement, last cath 07/2022 with plan for medical therapy, no suitable targets for revascularization, CKD IV, type 2 DM, HLD, critical limb ischemia of left lower extremity s/p left anterior tibial/dorsalis pedis angioplasty and left mid SFA stent 06/10/22,  right brachial/radial/ulnar ischemia requiring thrombectomy 10/2021 now admitted with unstable angina.    Today no chest pain, no SOB,  has not had BM in several days takes natural medication at home.  Will add miralax here.   Vital Signs .    Vitals:   11/09/22 1935 11/10/22 0053 11/10/22 0339 11/10/22 0709  BP: 130/65 95/62 122/69 132/67  Pulse: 88 84 80 89  Resp: 18 18 18 18   Temp: 97.6 F (36.4 C) 97.6 F (36.4 C) 98.5 F (36.9 C) 97.7 F (36.5 C)  TempSrc: Oral Oral Oral Oral  SpO2: 96% 93% 96% 98%  Weight:   104.3 kg   Height:        Intake/Output Summary (Last 24 hours) at 11/10/2022 0910 Last data filed at 11/10/2022 0342 Gross per 24 hour  Intake 240 ml  Output 1300 ml  Net -1060 ml      11/10/2022    3:39 AM 11/09/2022    5:30 AM 11/08/2022    5:52 AM  Last 3 Weights  Weight (lbs) 229 lb 14.4 oz 227 lb 8.2 oz 225 lb 5 oz  Weight (kg) 104.282 kg 103.2 kg 102.2 kg      Telemetry/ECG    SR - Personally Reviewed  Physical Exam .   GEN: No acute distress.   Neck: No JVD Cardiac: RRR, no murmurs, rubs, or gallops.  Respiratory: Clear to auscultation bilaterally. GI: Soft, nontender, non-distended  MS: 1+ lower ext edema  Assessment & Plan .     NSTEMI/Multivessel  CAD --Imdur was titrated to 120 mg, on plavix,  toprol XL 100 mg  and ranexa 1000 BID  --last cath 07/2022 with  2 vessel occlusive CAD. The LAD is occluded after a small diagonal. The RCA is small and diffusely diseased. It is occluded distally.   Medical management. No suitable targets for revascularization. No angina currently  Acute on chronic systolic and diastolic HF/ICM --EF 30-35%  --neg 620  though wt is up  --lasix prn per home dose  HTN --BP has been labile but last 2 checks 132/67  or so  --on hydralazine in addition to imdur and toprol  CKD-4  --2.76 on admit now 3.21  --follows with nephrology as outpt  Hx of CVA/PAD/DM-1 on insulin pump per IM --on coumadin for rt upper ext brachial artery embolus in 2023  (INR 2.4 today)   For questions or updates, please contact [facility] Please consult [email or web address] for contact info under   Signed, Nada Boozer, NP   No additional recommendations.  We will sign off.

## 2022-11-11 ENCOUNTER — Telehealth: Payer: Self-pay | Admitting: Podiatry

## 2022-11-11 ENCOUNTER — Other Ambulatory Visit: Payer: Self-pay

## 2022-11-11 ENCOUNTER — Other Ambulatory Visit: Payer: Self-pay | Admitting: Nurse Practitioner

## 2022-11-11 ENCOUNTER — Other Ambulatory Visit (HOSPITAL_COMMUNITY): Payer: Self-pay

## 2022-11-11 DIAGNOSIS — I25119 Atherosclerotic heart disease of native coronary artery with unspecified angina pectoris: Secondary | ICD-10-CM | POA: Diagnosis not present

## 2022-11-11 DIAGNOSIS — L97509 Non-pressure chronic ulcer of other part of unspecified foot with unspecified severity: Secondary | ICD-10-CM

## 2022-11-11 LAB — BASIC METABOLIC PANEL
Anion gap: 9 (ref 5–15)
BUN: 51 mg/dL — ABNORMAL HIGH (ref 6–20)
CO2: 21 mmol/L — ABNORMAL LOW (ref 22–32)
Calcium: 8.3 mg/dL — ABNORMAL LOW (ref 8.9–10.3)
Chloride: 102 mmol/L (ref 98–111)
Creatinine, Ser: 3.11 mg/dL — ABNORMAL HIGH (ref 0.44–1.00)
GFR, Estimated: 19 mL/min — ABNORMAL LOW (ref >60)
Glucose, Bld: 315 mg/dL — ABNORMAL HIGH (ref 70–99)
Potassium: 5 mmol/L (ref 3.5–5.1)
Sodium: 132 mmol/L — ABNORMAL LOW (ref 135–145)

## 2022-11-11 LAB — GLUCOSE, CAPILLARY
Glucose-Capillary: 386 mg/dL — ABNORMAL HIGH (ref 70–99)
Glucose-Capillary: 175 mg/dL — ABNORMAL HIGH (ref 70–99)
Glucose-Capillary: 120 mg/dL — ABNORMAL HIGH (ref 70–99)
Glucose-Capillary: 163 mg/dL — ABNORMAL HIGH (ref 70–99)

## 2022-11-11 LAB — PROTIME-INR
INR: 2 — ABNORMAL HIGH (ref 0.8–1.2)
Prothrombin Time: 22.7 seconds — ABNORMAL HIGH (ref 11.4–15.2)

## 2022-11-11 MED ORDER — SODIUM CHLORIDE 0.9 % IV SOLN: INTRAVENOUS | Status: AC

## 2022-11-11 MED ORDER — GLUCAGON HCL RDNA (DIAGNOSTIC) 1 MG IJ SOLR: 1.0000 mg | Freq: Once | INTRAMUSCULAR | Status: DC | PRN

## 2022-11-11 MED ORDER — FUROSEMIDE 10 MG/ML IJ SOLN
20.0000 mg | Freq: Once | INTRAMUSCULAR | Status: AC
  Administered 2022-11-11: 20 mg via INTRAVENOUS
  Filled 2022-11-11: qty 2

## 2022-11-11 MED ORDER — OMNIPOD 5 DEXG7G6 PODS GEN 5 MISC
0 refills | Status: DC
  Filled 2022-11-11: qty 15, 30d supply, fill #0
  Filled 2023-01-04 (×2): qty 15, 30d supply, fill #1
  Filled 2023-02-15: qty 15, 30d supply, fill #2
  Filled 2023-04-05: qty 15, 30d supply, fill #3
  Filled 2023-05-13: qty 15, 30d supply, fill #4

## 2022-11-11 MED ORDER — LORAZEPAM 2 MG/ML IJ SOLN
0.5000 mg | Freq: Four times a day (QID) | INTRAMUSCULAR | Status: DC | PRN
  Administered 2022-11-11: 0.5 mg via INTRAVENOUS
  Filled 2022-11-11: qty 1

## 2022-11-11 NOTE — Plan of Care (Signed)
  Problem: Education: Goal: Understanding of cardiac disease, CV risk reduction, and recovery process will improve Outcome: Progressing   Problem: Education: Goal: Individualized Educational Video(s) Outcome: Progressing   

## 2022-11-11 NOTE — Consult Note (Signed)
   PODIATRY CONSULTATION  NAME Lauren [name] MRN [id number] DOB Jan 11, 1981 DOA 11/07/2022   Reason for consult: Ulcer LT lateral foot Chief Complaint  Patient presents with   Chest Pain   Shortness of Breath   Patient had follow-up appointment in our office scheduled for tomorrow, 11/12/2022.  Requesting to be seen inpatient for follow-up evaluation of the ulcer to the lateral aspect of the left foot.  ASSESSMENT/PLAN OF CARE S/p 5th ray amputation.  DOS: 07/21/2022 Superficial ulcer lateral aspect of the left foot  -Patient seen at bedside this evening.  -Wound appears very stable and superficial.  Clinically no indication of infection -While inpatient recommend Mepilex bandage as needed.  -When the patient is discharged resume wound care prior to admission -f/u with Dr. Lilian Kapur in office 2-3 weeks post discharge  Please contact me directly via secure chat with any questions or concerns.     Felecia Shelling, DPM [facility]  Dr. Felecia Shelling, DPM    [address] [place], [place] [address]                Office [phone]  Fax [phone]

## 2022-11-11 NOTE — Progress Notes (Signed)
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  Patient was admitted for CAD, CHF exacerbation.  Cardiology consulted.  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)   CAD (coronary artery disease)   History of CVA (cerebrovascular accident)   Acute renal failure superimposed on stage 4 chronic kidney disease (HCC)   Ischemic cardiomyopathy   Acute on chronic combined systolic and diastolic CHF (congestive heart failure) (HCC)   PVD (peripheral vascular disease) (HCC)   DNR (do not resuscitate)   Unstable angina (HCC)  NSTEMI, CAD -Heart cath March 2024 showed two-vessel occlusive CAD -Appreciate cardiology, remains on Plavix, Lipitor, Toprol, Ranexa, Imdur -No complaints of chest pain today  Acute on chronic systolic and diastolic heart failure, ischemic cardiomyopathy -IV diuresis on hold due to rise in creatinine -Fluid status remains stable  AKI on CKD stage IV -Baseline creatinine 2.3, now plateaued at 3.1-3.2.  Having good urine output -IV Lasix on hold -Give total 500 cc IV fluid over the next 6 hours -Repeat BMP in the morning  Hypertension -Hydralazine, Imdur, Toprol -BP  stable  History CVA with chronic left hemiparesis -Warfarin  History of PVD -Status post angioplasty left SFA  Diabetes mellitus type 1 -Levemir, NovoLog, sliding scale insulin  Hypothyroidism -Synthroid  Obesity -BMI 38  DVT prophylaxis:  warfarin (COUMADIN) tablet 5 mg  Code Status: DNR Family Communication: None at bedside Disposition Plan: Home once improvement in creatinine seen Status is: Inpatient Remains inpatient appropriate because: Continue to monitor creatinine    Antimicrobials:  Anti-infectives (From admission, onward)    None        Objective: Vitals:   11/11/22 0031 11/11/22 0407 11/11/22 0810 11/11/22 1155  BP:  137/74 (!) 154/86 (!) 143/73  Pulse: 79 90 87 86  Resp: 18 17 18 18   Temp: 98 F (36.7 C) 98.6 F (37 C) 98.6 F (37 C)   TempSrc: Oral Oral Oral   SpO2: 100% 98% 95% 95%  Weight:  104.8 kg    Height:        Intake/Output Summary (Last 24 hours) at 11/11/2022 1248 Last data filed at 11/11/2022 0300 Gross per 24 hour  Intake 832 ml  Output 1700 ml  Net -868 ml   Filed Weights   11/09/22 0530 11/10/22 0339 11/11/22 0407  Weight: 103.2 kg 104.3 kg 104.8 kg    Examination:  General exam: Appears calm and comfortable  Respiratory system: Clear to auscultation. Respiratory effort normal. No respiratory distress. No conversational dyspnea.  Cardiovascular system: S1 & S2 heard, RRR. No murmurs.  +1 bilateral pedal edema, nonpitting. Gastrointestinal system: Abdomen is nondistended, soft and nontender. Normal bowel sounds heard. Central nervous system: Alert and oriented. No focal neurological deficits. Speech  clear.  Extremities: Symmetric in appearance  Skin: No rashes, lesions or ulcers on exposed skin  Psychiatry: Judgement and insight appear normal. Mood & affect appropriate.   Data Reviewed: I have personally reviewed following labs and imaging studies  CBC: Recent Labs  Lab 11/07/22 1129 11/08/22 0105 11/10/22 0131   WBC 10.2 10.9* 8.8  HGB 10.2* 10.3* 8.9*  HCT 33.9* 32.2* 28.8*  MCV 85.2 83.9 84.2  PLT 446* 420* 342   Basic Metabolic Panel: Recent Labs  Lab 11/07/22 1129 11/08/22 0105 11/09/22 0032 11/10/22 0131 11/11/22 0156  NA 138 134* 134* 129* 132*  K 4.6 4.3 4.3 4.5 5.0  CL 103 102 102 101 102  CO2 24 21* 23 22 21*  GLUCOSE 183* 220* 267* 394* 315*  BUN 31* 30* 38* 46* 51*  CREATININE 2.76* 2.83* 3.22* 3.21* 3.11*  CALCIUM 8.7* 8.5* 8.2* 8.1* 8.3*  MG  --   --  2.3  --   --    GFR: Estimated Creatinine Clearance: 28.9 mL/min (A) (by C-G formula based on SCr of 3.11 mg/dL (H)). Liver Function Tests: Recent Labs  Lab 11/08/22 0105  AST 92*  ALT 59*  ALKPHOS 111  BILITOT 0.7  PROT 6.1*  ALBUMIN 2.8*   No results for input(s): "LIPASE", "AMYLASE" in the last 168 hours. No results for input(s): "AMMONIA" in the last 168 hours. Coagulation Profile: Recent Labs  Lab 11/07/22 1129 11/08/22 0105 11/09/22 0032 11/10/22 0131 11/11/22 0156  INR 3.1* 2.8* 2.8* 2.4* 2.0*   Cardiac Enzymes: No results for input(s): "CKTOTAL", "CKMB", "CKMBINDEX", "TROPONINI" in the last 168 hours. BNP (last 3 results) No results for input(s): "PROBNP" in the last 8760 hours. HbA1C: No results for input(s): "HGBA1C" in the last 72 hours. CBG: Recent Labs  Lab 11/10/22 1641 11/10/22 1825 11/10/22 2116 11/11/22 0616 11/11/22 1153  GLUCAP 60* 129* 368* 386* 175*   Lipid Profile: No results for input(s): "CHOL", "HDL", "LDLCALC", "TRIG", "CHOLHDL", "LDLDIRECT" in the last 72 hours. Thyroid Function Tests: No results for input(s): "TSH", "T4TOTAL", "FREET4", "T3FREE", "THYROIDAB" in the last 72 hours. Anemia Panel: No results for input(s): "VITAMINB12", "FOLATE", "FERRITIN", "TIBC", "IRON", "RETICCTPCT" in the last 72 hours. Sepsis Labs: No results for input(s): "PROCALCITON", "LATICACIDVEN" in the last 168 hours.  No results found for this or any previous visit (from the past 240  hour(s)).    Radiology Studies: No results found.    Scheduled Meds:  atorvastatin  40 mg Oral Daily   clopidogrel  75 mg Oral Daily   hydrALAZINE  25 mg Oral Q8H   insulin aspart  0-15 Units Subcutaneous TID WC   insulin aspart  0-5 Units Subcutaneous QHS   insulin aspart  10 Units Subcutaneous TID WC   insulin detemir  26 Units Subcutaneous Daily   isosorbide mononitrate  120 mg Oral Daily   levothyroxine  125 mcg Oral Q0600   metoprolol succinate  100 mg Oral Daily   multivitamin with minerals  1 tablet Oral Daily   polyethylene glycol  17 g Oral Daily   ranolazine  1,000 mg Oral BID   scopolamine  1 patch Transdermal Q72H   tiZANidine  4 mg Oral QHS   traZODone  150 mg Oral QHS   warfarin  5 mg Oral q1600   Warfarin - Pharmacist Dosing Inpatient   Does not apply q1600   Continuous Infusions:  sodium chloride       LOS: 3 days   Time spent: 25 minutes  Noralee Stain, DO [facility] 11/11/2022, 12:48 PM   Available via Epic secure chat 7am-7pm After these hours, please refer to coverage provider listed on [email or web address]

## 2022-11-11 NOTE — Progress Notes (Signed)
TRH night cross cover note:   I was notified by RN that this patient does not currently have IV access, and that the patient would prefer to not reestablish IV access at this time.   She is not currently on any IV fluids or schedule IV medications.  Vital signs remain stable, including normotensive blood pressures, along with heart rates in the 70s to 80s.  RN conveys the patient has had some lability associated with her blood sugars.   Per patient request, will refrain from reestablishing IV access at this time.  I have ordered prn IM glucagon for CBG less than 70.     Newton Pigg, DO Hospitalist

## 2022-11-11 NOTE — Telephone Encounter (Signed)
Pt wants to know if you will be coming to the hospital to look at her wound. Please advise

## 2022-11-11 NOTE — Progress Notes (Signed)
   Heart Failure Stewardship Pharmacist Progress Note   PCP: Billie Lade, MD PCP-Cardiologist: Dina Rich, MD    HPI:  42 yo F with PMH of CHF, PVD, type 1 diabetes, hypothyroidism, HLD, CKD IV, CAD, and stroke.   Admitted 07/2022 with NSTEMI in the setting of diabetic foot infection, requiring left fifth metatarsal excision. Underwent cardiac cath and found multivessel CAD without clear target for revascularization. She was recommended medical management. EF at that time showed LVEF 30-35% (was 35-40% in 05/2020) and mildly reduced RV.   Presented to the ED with chest pain, shortness of breath, orthopnea, LE edema, and N/V. CXR without acute finding. BNP elevated. Given previous heart cath findings/recommendations and advanced renal disease, not pursuing additional cath at this time.   Current HF Medications: Beta Blocker: metoprolol XL 100 mg daily Other: hydralazine 25 mg TID + Imdur 120 mg daily  Prior to admission HF Medications: Diuretic: furosemide 40 mg daily PRN Beta blocker: metoprolol XL 100 mg daily Other: hydralazine 25 mg BID + Imdur 40 mg BID  Pertinent Lab Values: Serum creatinine 3.11, BUN 51, Potassium 5.0, Sodium 132, Magnesium 2.3, BNP 1973.2, A1c 8.3   Vital Signs: Weight: 231 lbs (admission weight: 223 lbs) Blood pressure: 130-150/70s  Heart rate: 80s  I/O: incomplete  Medication Assistance / Insurance Benefits Check: Does the patient have prescription insurance?  Yes Type of insurance plan: [facility] commercial insurance  Outpatient Pharmacy:  Prior to admission outpatient pharmacy: [facility]  Is the patient willing to use [facility] TOC pharmacy at discharge? Yes Is the patient willing to transition their outpatient pharmacy to utilize a [facility] outpatient pharmacy?   No - has the option to get delivery service with [facility]     Assessment: 1. Acute on chronic systolic CHF (LVEF 30-35%), due to ICM. NYHA class II symptoms. - Off  IV lasix with creatinine bump, creatinine stable from yesterday. Weight up 2 lbs with LE edema. Consider resuming lasix at discharge. Strict I/Os and daily weights. Keep K>4 and Mg>2. - Continue metoprolol XL 100 mg daily - Continue hydralazine 25 mg TID and Imdur 120 mg daily - GDMT limited by advanced CKD - No SGLT2i with type 1 diabetes    Plan: 1) Medication changes recommended at this time: - Resume lasix at discharge  2) Patient assistance: - None pending  3)  Education  - Patient has been educated on current HF medications and potential additions to HF medication regimen - Patient verbalizes understanding that over the next few months, these medication doses may change and more medications may be added to optimize HF regimen - Patient has been educated on basic disease state pathophysiology and goals of therapy   Sharen Hones, PharmD, BCPS Heart Failure Stewardship Pharmacist Phone [phone]

## 2022-11-12 ENCOUNTER — Ambulatory Visit: Payer: Commercial Managed Care - PPO | Admitting: Podiatry

## 2022-11-12 ENCOUNTER — Other Ambulatory Visit (HOSPITAL_COMMUNITY): Payer: Self-pay

## 2022-11-12 ENCOUNTER — Other Ambulatory Visit (HOSPITAL_BASED_OUTPATIENT_CLINIC_OR_DEPARTMENT_OTHER): Payer: Self-pay

## 2022-11-12 DIAGNOSIS — I25119 Atherosclerotic heart disease of native coronary artery with unspecified angina pectoris: Secondary | ICD-10-CM | POA: Diagnosis not present

## 2022-11-12 LAB — BASIC METABOLIC PANEL
Anion gap: 10 (ref 5–15)
BUN: 50 mg/dL — ABNORMAL HIGH (ref 6–20)
CO2: 18 mmol/L — ABNORMAL LOW (ref 22–32)
Calcium: 8.4 mg/dL — ABNORMAL LOW (ref 8.9–10.3)
Chloride: 106 mmol/L (ref 98–111)
Creatinine, Ser: 3.07 mg/dL — ABNORMAL HIGH (ref 0.44–1.00)
GFR, Estimated: 19 mL/min — ABNORMAL LOW (ref >60)
Glucose, Bld: 328 mg/dL — ABNORMAL HIGH (ref 70–99)
Potassium: 4.5 mmol/L (ref 3.5–5.1)
Sodium: 134 mmol/L — ABNORMAL LOW (ref 135–145)

## 2022-11-12 LAB — GLUCOSE, CAPILLARY
Glucose-Capillary: 420 mg/dL — ABNORMAL HIGH (ref 70–99)
Glucose-Capillary: 383 mg/dL — ABNORMAL HIGH (ref 70–99)
Glucose-Capillary: 352 mg/dL — ABNORMAL HIGH (ref 70–99)
Glucose-Capillary: 179 mg/dL — ABNORMAL HIGH (ref 70–99)

## 2022-11-12 LAB — PROTIME-INR
INR: 2.2 — ABNORMAL HIGH (ref 0.8–1.2)
Prothrombin Time: 24.2 seconds — ABNORMAL HIGH (ref 11.4–15.2)

## 2022-11-12 MED ORDER — ISOSORBIDE MONONITRATE ER 60 MG PO TB24
120.0000 mg | ORAL_TABLET | Freq: Every day | ORAL | 1 refills | Status: DC
  Filled 2022-11-12: qty 60, 30d supply, fill #0

## 2022-11-12 MED ORDER — INSULIN ASPART 100 UNIT/ML IJ SOLN
4.0000 [IU] | Freq: Once | INTRAMUSCULAR | Status: AC
  Administered 2022-11-12: 4 [IU] via SUBCUTANEOUS

## 2022-11-12 MED ORDER — HYDRALAZINE HCL 25 MG PO TABS
25.0000 mg | ORAL_TABLET | Freq: Three times a day (TID) | ORAL | 1 refills | Status: DC
  Filled 2022-11-12: qty 90, 30d supply, fill #0

## 2022-11-12 NOTE — TOC Transition Note (Signed)
Transition of Care [facility]) - CM/SW Discharge Note   Patient Details  Name: Lauren [name] MRN: [id number] Date of Birth: February 28, 1981  [facility]) CM/SW Contact:  Leone Haven, RN Phone Number: 11/12/2022, 10:53 AM   Clinical Narrative:    Patient is for dc today, she has transport home.  TOC to fill some of her meds.  She has her insulin pump also.      Barriers to Discharge: Continued Medical Work up   Patient Goals and CMS Choice   Choice offered to / list presented to : NA  Discharge Placement                         Discharge Plan and Services Additional resources added to the After Visit Summary for   In-house Referral: NA Discharge Planning Services: CM Consult Post Acute Care Choice: NA          DME Arranged: N/A DME Agency: NA       HH Arranged: NA          Social Determinants of Health (SDOH) Interventions SDOH Screenings   Food Insecurity: No Food Insecurity (11/07/2022)  Housing: Patient Declined (11/09/2022)  Transportation Needs: No Transportation Needs (11/09/2022)  Utilities: Not At Risk (11/07/2022)  Alcohol Screen: Low Risk  (11/09/2022)  Depression (PHQ2-9): Medium Risk (10/28/2022)  Financial Resource Strain: Low Risk  (11/09/2022)  Physical Activity: Inactive (03/03/2022)  Social Connections: Socially Integrated (03/03/2022)  Stress: No Stress Concern Present (03/03/2022)  Recent Concern: Stress - Stress Concern Present (12/22/2021)  Tobacco Use: Medium Risk (11/07/2022)     Readmission Risk Interventions    08/05/2022   11:08 AM  Readmission Risk Prevention Plan  Transportation Screening Complete  Medication Review (RN Care Manager) Complete  PCP or Specialist appointment within 3-5 days of discharge Complete  Palliative Care Screening Not Applicable  Skilled Nursing Facility Not Applicable

## 2022-11-12 NOTE — Progress Notes (Signed)
ANTICOAGULATION CONSULT NOTE  Pharmacy Consult to resume Warfarin Indication: chest pain/ACS  Allergies  Allergen Reactions   Visipaque [Iodixanol] Nausea And Vomiting    Patient had vagal response during procedure became hypertensive, and bradycardic. CO2 used during procedure prior to contrast being used, this may be cause as well. Recommended premedicating prior to contrast being used in the future.    Wellbutrin [Bupropion] Hives   Gabapentin     dizzy   Cefepime Rash    Tolerates penicilllin   Ciprofloxacin Hcl Hives and Rash    Hives/rash at injection site; 01/15/22 tolerated IV cipro   Tape Rash    Patient Measurements: Height: 5\' 5"  (165.1 cm) Weight: 105 kg (231 lb 7.7 oz) IBW/kg (Calculated) : 57 Heparin Dosing Weight: 80.3 kg  Vital Signs: Temp: 98.7 F (37.1 C) (07/12 0848) Temp Source: Oral (07/12 0848) BP: 143/79 (07/12 0848) Pulse Rate: 84 (07/12 0848)  Labs: Recent Labs    11/10/22 0131 11/11/22 0156 11/12/22 0901  HGB 8.9*  --   --   HCT 28.8*  --   --   PLT 342  --   --   LABPROT 26.4* 22.7* 24.2*  INR 2.4* 2.0* 2.2*  CREATININE 3.21* 3.11* 3.07*    Estimated Creatinine Clearance: 29.3 mL/min (A) (by C-G formula based on SCr of 3.07 mg/dL (H)).   Medical History: Past Medical History:  Diagnosis Date   Anemia    CAD (coronary artery disease)    a. s/p cath in 03/2014 showing 30% mid-LAD, moderate to severe disease along small D1, patent LCx, moderate to severe distal OM2 stenosis and moderate diffuse diease along RCA not amenable to PCI   CHF (congestive heart failure) (HCC)    a. EF 55-60% in 12/2019 b. EF at 35-40% by echo in 05/2020   CKD (chronic kidney disease) stage 4, GFR 15-29 ml/min (HCC)    Diabetes mellitus without complication (HCC)    Myocardial infarction (HCC)    Neuropathy    PVD (peripheral vascular disease) (HCC)    Stroke (HCC) 01/2022   L hand weakness    Assessment: 42 yof with a history of PVD, T1DM,  hypothyroidism, CKD, unstable angina, osteomyelitis of the left foot, MCA stroke. Patient presented on 11/07/22 with chest pain. Heparin per pharmacy consult was ordered for chest pain/ACS and PTA warfarin held due to potential need for cardiac procedures.    Patient has known multivessel CAD and plans are noted for medical management.  Warfarin was resumed 7/8 -INR= 2,2  Home dose: 5mg /day  Goal of Therapy:  INR 2-3 Monitor platelets by anticoagulation protocol: Yes   Plan:  Warfarin 5mg  daily (PTA dose) Plans noted for discharge  Harland German, PharmD Clinical Pharmacist **Pharmacist phone directory can now be found on [email or web address] ([email or web address]).  Listed under [facility].

## 2022-11-12 NOTE — Discharge Summary (Signed)
Physician Discharge Summary  Lauren [name] WUJ:[id number] DOB: [birth year] DOA: 11/07/2022  PCP: Billie Lade, MD  Admit date: 11/07/2022 Discharge date: 11/12/2022  Admitted From: Home Disposition: Home  Recommendations for Outpatient Follow-up:  Follow up with PCP in 1 week Follow up with cardiology Follow-up with nephrology Follow-up with Dr. 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  Patient was admitted for CAD, CHF exacerbation.  Cardiology consulted.  Patient was recommended for medical management, her medication dosing was adjusted.  Patient did have acute kidney injury on CKD stage IV and IV diuretic was held.  Creatinine plateaued and patient will follow-up with her outpatient nephrologist.  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)   CAD (coronary artery disease)   History of CVA (cerebrovascular accident)   Acute renal failure superimposed on stage 4 chronic kidney disease (HCC)   Ischemic cardiomyopathy   Acute on chronic combined systolic and diastolic CHF (congestive heart failure) (HCC)   PVD (peripheral vascular disease) (HCC)   DNR (do not resuscitate)   Unstable angina (HCC)    NSTEMI, CAD -Heart cath March 2024 showed two-vessel  occlusive CAD -Appreciate cardiology, remains on Plavix, Lipitor, Toprol, Ranexa, Imdur -No complaints of chest pain today   Acute on chronic systolic and diastolic heart failure, ischemic cardiomyopathy -IV diuresis on hold due to rise in creatinine -Fluid status remains stable -Resume home Lasix as needed   AKI on CKD stage IV -Baseline creatinine 2.3, now plateaued at 3.07-3.2.  Having good urine output -Follow-up with nephrology outpatient   Hypertension -Hydralazine, Imdur, Toprol -BP stable   History CVA with chronic left hemiparesis -Plavix, warfarin   History of PVD -Status post angioplasty left SFA -Plavix, warfarin   Diabetes mellitus type 1 -Resume insulin pump  Hypothyroidism -Synthroid   Obesity -BMI 38  Status post fifth ray amputation -Follow-up with Dr. Lilian Kapur outpatient  Discharge Instructions  Discharge Instructions     (HEART FAILURE PATIENTS) Call MD:  Anytime you have any of the following symptoms: 1) 3 pound weight gain in 24 hours or 5 pounds in 1 week 2) shortness of breath, with or without a dry hacking cough 3) swelling in the hands, feet or stomach 4) if you have to sleep on extra pillows at night in order to breathe.   Complete by: As directed    Call MD for:  difficulty breathing, headache or visual disturbances   Complete by: As directed    Call MD for:  extreme fatigue   Complete by: As directed    Call MD for:  hives   Complete by: As directed    Call MD for:  persistant dizziness or light-headedness   Complete by: As directed    Call MD for:  persistant nausea and vomiting   Complete by: As directed    Call  MD for:  severe uncontrolled pain   Complete by: As directed    Call MD for:  temperature >100.4   Complete by: As directed    Diet - low sodium heart healthy   Complete by: As directed    Diet Carb Modified   Complete by: As directed    Discharge instructions   Complete by: As directed    You were cared for by a  hospitalist during your hospital stay. If you have any questions about your discharge medications or the care you received while you were in the hospital after you are discharged, you can call the unit and ask to speak with the hospitalist on call if the hospitalist that took care of you is not available. Once you are discharged, your primary care physician will handle any further medical issues. Please note that NO REFILLS for any discharge medications will be authorized once you are discharged, as it is imperative that you return to your primary care physician (or establish a relationship with a primary care physician if you do not have one) for your aftercare needs so that they can reassess your need for medications and monitor your lab values.   Increase activity slowly   Complete by: As directed       Allergies as of 11/12/2022       Reactions   Visipaque [iodixanol] Nausea And Vomiting   Patient had vagal response during procedure became hypertensive, and bradycardic. CO2 used during procedure prior to contrast being used, this may be cause as well. Recommended premedicating prior to contrast being used in the future.    Wellbutrin [bupropion] Hives   Gabapentin    dizzy   Cefepime Rash   Tolerates penicilllin   Ciprofloxacin Hcl Hives, Rash   Hives/rash at injection site; 01/15/22 tolerated IV cipro   Tape Rash        Medication List     STOP taking these medications    isosorbide mononitrate 20 MG tablet Commonly known as: ISMO Replaced by: isosorbide mononitrate 120 MG 24 hr tablet       TAKE these medications    acetaminophen 325 MG tablet Commonly known as: TYLENOL Take 1-2 tablets (325-650 mg total) by mouth every 4 (four) hours as needed for mild pain. What changed:  how much to take when to take this   atorvastatin 40 MG tablet Commonly known as: LIPITOR Take 1 tablet (40 mg total) by mouth daily. Courtesy fill/ pt to get established with a primary MD for  further refills   clopidogrel 75 MG tablet Commonly known as: PLAVIX Take 1 tablet (75 mg total) by mouth daily.   furosemide 40 MG tablet Commonly known as: LASIX Take 40 mg daily as needed for swelling   hydrALAZINE 25 MG tablet Commonly known as: APRESOLINE Take 1 tablet (25 mg total) by mouth every 8 (eight) hours. What changed: when to take this   insulin aspart 100 UNIT/ML injection Commonly known as: novoLOG Use with Omnipod for daily dose around 100 units daily What changed:  how much to take how to take this when to take this additional instructions   isosorbide mononitrate 120 MG 24 hr tablet Commonly known as: IMDUR Take 1 tablet (120 mg total) by mouth daily. Start taking on: November 13, 2022 Replaces: isosorbide mononitrate 20 MG tablet   levothyroxine 125 MCG tablet Commonly known as: SYNTHROID Take 1 tablet (125 mcg total) by mouth daily at 6 (six) AM.   lidocaine 4 %  Commonly known as: HM Lidocaine Patch Place 1 patch onto the skin daily. What changed:  when to take this reasons to take this   metoprolol succinate 100 MG 24 hr tablet Commonly known as: TOPROL-XL Take 1 tablet (100 mg total) by mouth daily. Take with or immediately following a meal.   multivitamin with minerals Tabs tablet Take 1 tablet by mouth daily.   nitroGLYCERIN 0.4 MG SL tablet Commonly known as: NITROSTAT Place 1 tablet (0.4 mg total) under the tongue every 5 (five) minutes x 3 doses as needed for chest pain.   Omnipod 5 G6 Pods (Gen 5) Misc Change pod every 48-72 hours.   oxyCODONE-acetaminophen 7.5-325 MG tablet Commonly known as: Percocet Take 1 tablet by mouth daily as needed for severe pain.   pantoprazole 40 MG tablet Commonly known as: PROTONIX Take 1 tablet (40 mg total) by mouth daily. Courtesy fill/ pt to get established with a primary MD for further refills What changed:  when to take this reasons to take this   ranolazine 1000 MG SR tablet Commonly  known as: RANEXA Take 1 tablet (1,000 mg total) by mouth 2 (two) times daily.   scopolamine 1 MG/3DAYS Commonly known as: TRANSDERM-SCOP Place 1 patch (1.5 mg total) onto the skin every 3 (three) days.   senna-docusate 8.6-50 MG tablet Commonly known as: Senokot-S Take 1-2 tablets by mouth daily as needed for mild constipation.   tiZANidine 4 MG tablet Commonly known as: ZANAFLEX TAKE (1) TABLET BY MOUTH AT BEDTIME.   traZODone 100 MG tablet Commonly known as: DESYREL TAKE 1 TABLET BY MOUTH AT BEDTIME. What changed: how much to take   warfarin 5 MG tablet Commonly known as: COUMADIN Take as directed. If you are unsure how to take this medication, talk to your nurse or doctor. Original instructions: TAKE ONE TABLET BY MOUTH DAILY EXCEPT 1 AND 1/2 TABLET ON WEDNESDAYS AND SATURDAYS OR AS DIRECTED What changed:  how much to take how to take this when to take this additional instructions        Follow-up Information     Billie Lade, MD. Schedule an appointment as soon as possible for a visit in 1 week(s).   Specialty: Internal Medicine Contact information: [address] [place] [address] [phone]         Antoine Poche, MD Follow up.   Specialty: Cardiology Contact information: [address] [place] [address] [phone]         [employer], [place]. Schedule an appointment as soon as possible for a visit in 1 week(s).   Why: [address] [place], [place] [address] [phone]               Allergies  Allergen Reactions   Visipaque [Iodixanol] Nausea And Vomiting    Patient had vagal response during procedure became hypertensive, and bradycardic. CO2 used during procedure prior to contrast being used, this may be cause as well. Recommended premedicating prior to contrast being used in the future.    Wellbutrin [Bupropion] Hives   Gabapentin     dizzy   Cefepime Rash    Tolerates penicilllin    Ciprofloxacin Hcl Hives and Rash    Hives/rash at injection site; 01/15/22 tolerated IV cipro   Tape Rash    Consultations: Cardiology Podiatry    Procedures/Studies: DG Chest 2 View  Result Date: 11/07/2022 CLINICAL DATA:  Chest pain. EXAM: CHEST - 2 VIEW COMPARISON:  10/11/2022.  CT, 08/12/2022. FINDINGS: Cardiac silhouette is mildly enlarged. No mediastinal or hilar masses. No evidence of adenopathy. Clear lungs.  No pleural effusion or pneumothorax. Skeletal structures are intact. IMPRESSION: No active cardiopulmonary disease. Electronically Signed   By: Amie Portland M.D.   On: 11/07/2022 12:22   DG Finger Thumb Left  Result Date: 10/28/2022 Repeat imaging left thumb multiple fractures noted on x-ray June 10 Date of injury was probably June 7 or so patient unclear X-ray shows a fracture of the proximal phalanx of the left thumb which is comminuted intra-articular and its at the base is minimally displaced There is another fracture at the DIP joint but the bony large mallet injury with displacement of the dorsal fragment involving 50% of the joint Impression Bony mallet injury intra-articular displaced significant Proximal phalanx fracture intra-articular       Discharge Exam: Vitals:   11/12/22 0407 11/12/22 0848  BP: 134/68 (!) 143/79  Pulse: 87 84  Resp: 16 15  Temp: 98.7 F (37.1 C) 98.7 F (37.1 C)  SpO2: 99% 98%    General: Pt is alert, awake, not in acute distress Cardiovascular: RRR, S1/S2 +, +pedal edema Respiratory: CTA bilaterally, no wheezing, no rhonchi, no respiratory distress, no conversational dyspnea, on room air  Abdominal: Soft, NT, ND, bowel sounds + Extremities: +pedal edema, no cyanosis Psych: Normal mood and affect, stable judgement and insight     The results of significant diagnostics from this hospitalization (including imaging, microbiology, ancillary and laboratory) are listed below for reference.     Microbiology: No results found for this  or any previous visit (from the past 240 hour(s)).   Labs: BNP (last 3 results) Recent Labs    07/27/22 0548 08/02/22 2023 11/07/22 1129  BNP 2,632.3* 869.3* 1,973.2*   Basic Metabolic Panel: Recent Labs  Lab 11/08/22 0105 11/09/22 0032 11/10/22 0131 11/11/22 0156 11/12/22 0901  NA 134* 134* 129* 132* 134*  K 4.3 4.3 4.5 5.0 4.5  CL 102 102 101 102 106  CO2 21* 23 22 21* 18*  GLUCOSE 220* 267* 394* 315* 328*  BUN 30* 38* 46* 51* 50*  CREATININE 2.83* 3.22* 3.21* 3.11* 3.07*  CALCIUM 8.5* 8.2* 8.1* 8.3* 8.4*  MG  --  2.3  --   --   --    Liver Function Tests: Recent Labs  Lab 11/08/22 0105  AST 92*  ALT 59*  ALKPHOS 111  BILITOT 0.7  PROT 6.1*  ALBUMIN 2.8*   No results for input(s): "LIPASE", "AMYLASE" in the last 168 hours. No results for input(s): "AMMONIA" in the last 168 hours. CBC: Recent Labs  Lab 11/07/22 1129 11/08/22 0105 11/10/22 0131  WBC 10.2 10.9* 8.8  HGB 10.2* 10.3* 8.9*  HCT 33.9* 32.2* 28.8*  MCV 85.2 83.9 84.2  PLT 446* 420* 342   Cardiac Enzymes: No results for input(s): "CKTOTAL", "CKMB", "CKMBINDEX", "TROPONINI" in the last 168 hours. BNP: Invalid input(s): "POCBNP" CBG: Recent Labs  Lab 11/11/22 1624 11/11/22 2119 11/12/22 0555 11/12/22 0740 11/12/22 0846  GLUCAP 120* 163* 420* 383* 352*   D-Dimer No results for input(s): "DDIMER" in the last 72 hours. Hgb A1c No results for input(s): "HGBA1C" in the last 72 hours. Lipid Profile No results for input(s): "CHOL", "HDL", "LDLCALC", "TRIG", "CHOLHDL", "LDLDIRECT" in the last 72 hours. Thyroid function studies No results for input(s): "TSH", "T4TOTAL", "T3FREE", "THYROIDAB" in the last 72 hours.  Invalid input(s): "FREET3" Anemia work up No results for input(s): "VITAMINB12", "FOLATE", "FERRITIN", "TIBC", "  IRON", "RETICCTPCT" in the last 72 hours. Urinalysis    Component Value Date/Time   COLORURINE YELLOW 01/09/2022 0225   APPEARANCEUR CLEAR 01/09/2022 0225    LABSPEC 1.045 (H) 01/09/2022 0225   PHURINE 7.0 01/09/2022 0225   GLUCOSEU 50 (A) 01/09/2022 0225   HGBUR NEGATIVE 01/09/2022 0225   BILIRUBINUR NEGATIVE 01/09/2022 0225   KETONESUR NEGATIVE 01/09/2022 0225   PROTEINUR >=300 (A) 01/09/2022 0225   UROBILINOGEN 0.2 06/23/2014 1227   NITRITE NEGATIVE 01/09/2022 0225   LEUKOCYTESUR TRACE (A) 01/09/2022 0225   Sepsis Labs Recent Labs  Lab 11/07/22 1129 11/08/22 0105 11/10/22 0131  WBC 10.2 10.9* 8.8   Microbiology No results found for this or any previous visit (from the past 240 hour(s)).   Patient was seen and examined on the day of discharge and was found to be in stable condition. Time coordinating discharge: 35 minutes including assessment and coordination of care, as well as examination of the patient.   SIGNED:  Noralee Stain, DO [facility] 11/12/2022, 10:47 AM

## 2022-11-12 NOTE — Progress Notes (Signed)
   Heart Failure Stewardship Pharmacist Progress Note   PCP: Billie Lade, MD PCP-Cardiologist: Dina Rich, MD    HPI:  42 yo F with PMH of CHF, PVD, type 1 diabetes, hypothyroidism, HLD, CKD IV, CAD, and stroke.   Admitted 07/2022 with NSTEMI in the setting of diabetic foot infection, requiring left fifth metatarsal excision. Underwent cardiac cath and found multivessel CAD without clear target for revascularization. She was recommended medical management. EF at that time showed LVEF 30-35% (was 35-40% in 05/2020) and mildly reduced RV.   Presented to the ED with chest pain, shortness of breath, orthopnea, LE edema, and N/V. CXR without acute finding. BNP elevated. Given previous heart cath findings/recommendations and advanced renal disease, not pursuing additional cath at this time.   Current HF Medications: Beta Blocker: metoprolol XL 100 mg daily Other: hydralazine 25 mg TID + Imdur 120 mg daily  Prior to admission HF Medications: Diuretic: furosemide 40 mg daily PRN Beta blocker: metoprolol XL 100 mg daily Other: hydralazine 25 mg BID + Imdur 40 mg BID  Pertinent Lab Values: As of 7/11: Serum creatinine 3.11, BUN 51, Potassium 5.0, Sodium 132, Magnesium 2.3, BNP 1973.2, A1c 8.3   Vital Signs: Weight: 231 lbs (admission weight: 223 lbs) Blood pressure: 130-140/70s  Heart rate: 80s  I/O: net -2L  Medication Assistance / Insurance Benefits Check: Does the patient have prescription insurance?  Yes Type of insurance plan: [facility] commercial insurance  Outpatient Pharmacy:  Prior to admission outpatient pharmacy: [facility]  Is the patient willing to use [facility] TOC pharmacy at discharge? Yes Is the patient willing to transition their outpatient pharmacy to utilize a [facility] outpatient pharmacy?   No - has the option to get delivery service with [facility]     Assessment: 1. Acute on chronic systolic CHF (LVEF 30-35%), due to ICM. NYHA class II  symptoms. - Off IV lasix with creatinine bump, labs pending today. Consider resuming lasix at discharge. Strict I/Os and daily weights. Keep K>4 and Mg>2. - Continue metoprolol XL 100 mg daily - Continue hydralazine 25 mg TID and Imdur 120 mg daily - GDMT limited by advanced CKD - No SGLT2i with type 1 diabetes    Plan: 1) Medication changes recommended at this time: - Resume lasix at discharge  2) Patient assistance: - None pending  3)  Education  - Patient has been educated on current HF medications and potential additions to HF medication regimen - Patient verbalizes understanding that over the next few months, these medication doses may change and more medications may be added to optimize HF regimen - Patient has been educated on basic disease state pathophysiology and goals of therapy   Sharen Hones, PharmD, BCPS Heart Failure Stewardship Pharmacist Phone [phone]

## 2022-11-12 NOTE — Inpatient Diabetes Management (Signed)
Inpatient Diabetes Program Recommendations  AACE/ADA: New Consensus Statement on Inpatient Glycemic Control (2015)  Target Ranges:  Prepandial:   less than 140 mg/dL      Peak postprandial:   less than 180 mg/dL (1-2 hours)      Critically ill patients:  140 - 180 mg/dL   Lab Results  Component Value Date   GLUCAP 352 (H) 11/12/2022   HGBA1C 8.3 (A) 10/18/2022    Latest Reference Range & Units 11/11/22 06:16 11/11/22 11:53 11/11/22 16:24 11/11/22 21:19 11/12/22 05:55 11/12/22 07:40 11/12/22 08:46  Glucose-Capillary 70 - 99 mg/dL 161 (H) 096 (H) 045 (H) 163 (H) 420 (H) 383 (H) 352 (H)  (H): Data is abnormally high  Review of Glycemic Control  Diabetes history: type 1 (diagnosed at age 29) Outpatient Diabetes medications: Omnipod insulin pump Current orders for Inpatient glycemic control: Levemir 26 units daily, Novolog 0-15 units correction scale TID, Novolog 0-5 units HS scale, Novolog 10 units TID  Inpatient Diabetes Program Recommendations:   Noted patient has hyperglycemia in am-most likely due to Levemir not lasting 24 hrs. For basal coverage. Please consider: -Give Levemir 6 units 8 pm tonight -Change Levemir to Semglee 26 units daily to last 24 hrs.   Thank you, Lauren [name]. Chani Ghanem, RN, MSN, CDE  Diabetes Coordinator Inpatient Glycemic Control Team Team Pager [phone] (8am-5pm) 11/12/2022 10:52 AM

## 2022-11-12 NOTE — Progress Notes (Signed)
TR[facility] night cross cover note:   I was notified by RN of updated cbg of 420 this AM. in the context of some recent lability of her blood sugar, will be conservative with dose of novolog. Specifically, I've ordered novolog 4 units [place] x 1 dose now with repeat cbg ordered for around 730 AM this morning.      Newton Pigg, DO Hospitalist

## 2022-11-15 ENCOUNTER — Telehealth: Payer: Self-pay

## 2022-11-15 MED ORDER — OXYCODONE-ACETAMINOPHEN 7.5-325 MG PO TABS: 1.0000 | ORAL_TABLET | Freq: Every day | ORAL | 0 refills | Status: DC | PRN

## 2022-11-15 NOTE — Transitions of Care (Post Inpatient/ED Visit) (Signed)
   11/15/2022  Name: Lauren [name] MRN: [id number] DOB: 1980/10/17  Today's TOC FU Call Status: Today's TOC FU Call Status:: Unsuccessul Call (1st Attempt) Unsuccessful Call (1st Attempt) Date: 11/15/22  Attempted to reach the patient regarding the most recent Inpatient/ED visit.  Follow Up Plan: Additional outreach attempts will be made to reach the patient to complete the Transitions of Care (Post Inpatient/ED visit) call.   Jodelle Gross, RN, BSN, CCM Care Management Coordinator [place]

## 2022-11-16 ENCOUNTER — Telehealth: Payer: Self-pay

## 2022-11-16 ENCOUNTER — Ambulatory Visit: Payer: Commercial Managed Care - PPO | Admitting: Internal Medicine

## 2022-11-16 ENCOUNTER — Encounter: Payer: Self-pay | Admitting: Physical Medicine & Rehabilitation

## 2022-11-16 ENCOUNTER — Other Ambulatory Visit: Payer: Self-pay | Admitting: Physical Medicine & Rehabilitation

## 2022-11-16 NOTE — Transitions of Care (Post Inpatient/ED Visit) (Signed)
11/16/2022  Name: Lauren [name] MRN: [id number] DOB: [birth year]  Today's TOC FU Call Status: Today's TOC FU Call Status:: Successful TOC FU Call Competed TOC FU Call Complete Date: 11/16/22  Transition Care Management Follow-up Telephone Call Date of Discharge: 11/12/22 Discharge Facility: [facility]) Type of Discharge: Inpatient Admission Primary Inpatient Discharge Diagnosis:: Chest Pain How have you been since you were released from the hospital?: Better Any questions or concerns?: Yes Patient Questions/Concerns:: Patient inquired when she will be able to drive, educated that she cannot drive while taking Percocet Patient Questions/Concerns Addressed: Other: (advised patient to discuss with PCP at [facility] visit tomorrow)  Items Reviewed: Did you receive and understand the discharge instructions provided?: Yes Medications obtained,verified, and reconciled?: Yes (Medications Reviewed) Any new allergies since your discharge?: No Dietary orders reviewed?: No Do you have support at home?: Yes People in Home: spouse Name of Support/Comfort Primary Source: Fayrene Fearing  Medications Reviewed Today: Medications Reviewed Today     Reviewed by Jodelle Gross, RN (Case Manager) on 11/16/22 at 1013  Med List Status: <None>   Medication Order Taking? Sig Documenting Provider Last Dose Status Informant  acetaminophen (TYLENOL) 325 MG tablet [id number] Yes Take 1-2 tablets (325-650 mg total) by mouth every 4 (four) hours as needed for mild pain.  Patient taking differently: Take 650 mg by mouth as needed for mild pain.   Jacquelynn Cree, PA-C Taking Active Spouse/Significant Other, Pharmacy Records  atorvastatin (LIPITOR) 40 MG tablet [id number] Yes Take 1 tablet (40 mg total) by mouth daily. Courtesy fill/ pt to get established with a primary MD for further refills Billie Lade, MD Taking Active Spouse/Significant Other, Pharmacy Records  clopidogrel (PLAVIX) 75 MG tablet [id number] Yes Take 1  tablet (75 mg total) by mouth daily. Micki Riley, MD Taking Active Spouse/Significant Other, Pharmacy Records  furosemide (LASIX) 40 MG tablet [id number] Yes Take 40 mg daily as needed for swelling Antoine Poche, MD Taking Active Spouse/Significant Other, Pharmacy Records  hydrALAZINE (APRESOLINE) 25 MG tablet [id number] Yes Take 1 tablet (25 mg total) by mouth every 8 (eight) hours. Noralee Stain, DO Taking Active   insulin aspart (NOVOLOG) 100 UNIT/ML injection [id number] Yes Use with Omnipod for daily dose around 100 units daily  Patient taking differently: Inject 0-100 Units into the skin See admin instructions. Use with Porfirio Mylar, Freddi Starr, NP Taking Active Spouse/Significant Other, Pharmacy Records  Insulin Disposable Pump (OMNIPOD 5 G6 PODS, GEN 5,) MISC [id number] Yes Change pod every 48-72 hours. Dani Gobble, NP Taking Active   isosorbide mononitrate (IMDUR) 60 MG 24 hr tablet [id number] Yes Take 2 tablets (120 mg total) by mouth daily. Noralee Stain, DO Taking Active   levothyroxine (SYNTHROID) 125 MCG tablet [id number] Yes Take 1 tablet (125 mcg total) by mouth daily at 6 (six) AM. Dani Gobble, NP Taking Active Spouse/Significant Other, Pharmacy Records  lidocaine (HM LIDOCAINE PATCH) 4 % [id number] Yes Place 1 patch onto the skin daily.  Patient taking differently: Place 1 patch onto the skin daily as needed (Pain).   Billie Lade, MD Taking Active Spouse/Significant Other, Pharmacy Records  metoprolol succinate (TOPROL-XL) 100 MG 24 hr tablet [id number] Yes Take 1 tablet (100 mg total) by mouth daily. Take with or immediately following a meal. Willeen Niece, MD Taking Active Spouse/Significant Other, Pharmacy Records  Multiple Vitamin (MULTIVITAMIN WITH MINERALS) TABS tablet [id number] Yes Take 1 tablet by mouth daily. Love, Evlyn Kanner, PA-C Taking Active Spouse/Significant Other, Pharmacy Records  nitroGLYCERIN (NITROSTAT) 0.4 MG SL tablet [id number] Yes Place  1 tablet (0.4 mg total) under the tongue every 5 (five) minutes x 3 doses as needed for chest pain. Jacquelynn Cree, PA-C Taking Active Spouse/Significant Other, Pharmacy Records  oxyCODONE-acetaminophen (PERCOCET) 7.5-325 MG tablet [id number] Yes Take 1 tablet by mouth daily as needed for severe pain. Fanny Dance, MD Taking Active   pantoprazole (PROTONIX) 40 MG tablet [id number] Yes Take 1 tablet (40 mg total) by mouth daily. Courtesy fill/ pt to get established with a primary MD for further refills  Patient taking differently: Take 40 mg by mouth daily as needed (reflux).   Billie Lade, MD Taking Active Spouse/Significant Other, Pharmacy Records  ranolazine [facility]) 1000 MG SR tablet [id number] Yes Take 1 tablet (1,000 mg total) by mouth 2 (two) times daily. Billie Lade, MD Taking Active Spouse/Significant Other, Pharmacy Records  scopolamine (TRANSDERM-SCOP) 1 MG/3DAYS [id number]  Place 1 patch (1.5 mg total) onto the skin every 3 (three) days. Billie Lade, MD  Active Spouse/Significant Other, Pharmacy Records  senna-docusate (SENOKOT-S) 8.6-50 MG tablet [id number]  Take 1-2 tablets by mouth daily as needed for mild constipation. Gardenia Phlegm, MD  Active Spouse/Significant Other, Pharmacy Records  tiZANidine (ZANAFLEX) 4 MG tablet [id number] Yes TAKE (1) TABLET BY MOUTH AT BEDTIME. Billie Lade, MD Taking Active   traZODone (DESYREL) 100 MG tablet [id number] Yes TAKE 1 TABLET BY MOUTH AT BEDTIME. Billie Lade, MD Taking Active   warfarin (COUMADIN) 5 MG tablet [id number] Yes TAKE ONE TABLET BY MOUTH DAILY EXCEPT 1 AND 1/2 TABLET ON WEDNESDAYS AND SATURDAYS OR AS DIRECTED  Patient taking differently: Take 5 mg by mouth every evening.   Antoine Poche, MD Taking Active Spouse/Significant Other, Pharmacy Records           Med Note [place] Small Nov 08, 2022  8:26 AM)              Home Care and Equipment/Supplies: Were Home Health Services Ordered?: No Any  new equipment or medical supplies ordered?: No  Functional Questionnaire: Do you need assistance with bathing/showering or dressing?: No Do you need assistance with meal preparation?: No Do you need assistance with eating?: No Do you have difficulty maintaining continence: No Do you need assistance with getting out of bed/getting out of a chair/moving?: No Do you have difficulty managing or taking your medications?: No  Follow up appointments reviewed: PCP Follow-up appointment confirmed?: Yes Date of PCP follow-up appointment?: 11/17/22 Follow-up Provider: Dr. Barbaraann Faster [facility] Follow-up appointment confirmed?: Yes Date of Specialist follow-up appointment?: 11/22/22 Follow-Up Specialty Provider:: Dr. Natale Lay Do you need transportation to your follow-up appointment?: No Do you understand care options if your condition(s) worsen?: Yes-patient verbalized understanding  SDOH Interventions Today    Flowsheet Row Most Recent Value  SDOH Interventions   Food Insecurity Interventions Intervention Not Indicated  Transportation Interventions Intervention Not Indicated  Utilities Interventions Intervention Not Indicated      Jodelle Gross, RN, BSN, CCM Care Management Coordinator [facility]

## 2022-11-17 ENCOUNTER — Ambulatory Visit: Payer: Commercial Managed Care - PPO | Admitting: Internal Medicine

## 2022-11-17 ENCOUNTER — Encounter: Payer: Self-pay | Admitting: Internal Medicine

## 2022-11-17 ENCOUNTER — Ambulatory Visit: Payer: Commercial Managed Care - PPO | Attending: Cardiology | Admitting: *Deleted

## 2022-11-17 VITALS — BP 121/80 | HR 87 | Resp 16 | Ht 65.0 in | Wt 229.1 lb

## 2022-11-17 DIAGNOSIS — F5101 Primary insomnia: Secondary | ICD-10-CM

## 2022-11-17 DIAGNOSIS — I639 Cerebral infarction, unspecified: Secondary | ICD-10-CM | POA: Insufficient documentation

## 2022-11-17 DIAGNOSIS — Z5181 Encounter for therapeutic drug level monitoring: Secondary | ICD-10-CM | POA: Insufficient documentation

## 2022-11-17 DIAGNOSIS — R110 Nausea: Secondary | ICD-10-CM | POA: Diagnosis not present

## 2022-11-17 LAB — POCT INR: INR: 1.6 — AB (ref 2.0–3.0)

## 2022-11-17 MED ORDER — SCOPOLAMINE 1 MG/3DAYS TD PT72
1.0000 | MEDICATED_PATCH | TRANSDERMAL | 2 refills | Status: DC
Start: 2022-11-17 — End: 2023-01-25

## 2022-11-17 MED ORDER — TRAZODONE HCL 100 MG PO TABS
100.0000 mg | ORAL_TABLET | Freq: Every day | ORAL | 0 refills | Status: DC
Start: 2022-11-17 — End: 2023-01-25

## 2022-11-17 NOTE — Progress Notes (Signed)
   HPI:[name] is a 42 y.o. female who presents for evaluation of Hospitalization Follow-up (Discharged 7/12 NSTEMI. Wants to discuss xray on her ribs, never heard back but she saw report and nothing looked fractured but she was in so much pain she thinks something was broken ).   Patient admitted for chest pain and diagnosed with NSTEMI.  Reviewed notes and patient has two-vessel occlusive coronary artery disease.  The LAD is occluded after small diagonal branch and RCA is small and diffusely diseased.  There is no suitable targets for revascularization and therefore medical management has been recommended. Patient has follow up with her cardiologist in the next month.  We reviewed her chest xray and no signs of fracture. She needs refill on her trazodone for insomnia.  In addition she has chronic  Physical Exam: Vitals:   11/17/22 1351  BP: 121/80  Pulse: 87  Resp: 16  SpO2: 96%  Weight: 229 lb 1.9 oz (103.9 kg)  Height: 5\' 5"  (1.651 m)     Physical Exam Constitutional:      General: She is in acute distress.     Comments: Sitting in wheelchair.   Cardiovascular:     Rate and Rhythm: Normal rate and regular rhythm.     Heart sounds: No murmur heard. Pulmonary:     Effort: Pulmonary effort is normal.     Breath sounds: No wheezing or rales.  Musculoskeletal:     Right lower leg: Edema (1+) present.     Left lower leg: Edema (1+) present.     Comments: Edema in right hand      Assessment & Plan:   Lauren [name] was seen today for hospitalization follow-up.  Primary insomnia Assessment & Plan: Doing well since increasing Trazadone to 100 mg Chronic problem, stable Refilled Trazadone 100 mg qhs  Orders: -     traZODone HCl; Take 1 tablet (100 mg total) by mouth at bedtime.  Dispense: 90 tablet; Refill: 0  Nausea Assessment & Plan: Reviewed previous note under nausea. Patient was constipated on last xray of abdomen. She is on opioid with bowel regimen.   She has  been using scopoline patches, but they loose effectiveness at 2.5 days. Will refill for 12 patches per month so patient can change before nausea starts.   Orders: -     Scopolamine; Place 1 patch (1.5 mg total) onto the skin every 3 (three) days.  Dispense: 12 patch; Refill: 2      Milus Banister, MD

## 2022-11-17 NOTE — Patient Instructions (Addendum)
Thank you, [name] for allowing [name] to provide your care today.   Nausea  - scopolamine (TRANSDERM-SCOP) 1 MG/3DAYS; Place 1 patch (1.5 mg total) onto the skin every 3 (three) days.  Dispense: 12 patch; Refill: 2  Primary insomnia  - traZODone (DESYREL) 100 MG tablet; Take 1 tablet (100 mg total) by mouth at bedtime.  Dispense: 90 tablet; Refill: 0   Reminders: Follow up with cardiology.    Thurmon Fair, M.D.

## 2022-11-17 NOTE — Assessment & Plan Note (Signed)
Doing well since increasing Trazadone to 100 mg Chronic problem, stable Refilled Trazadone 100 mg qhs

## 2022-11-17 NOTE — Assessment & Plan Note (Addendum)
Chronic problem, stable Reviewed previous note under nausea. Patient was constipated on last xray of abdomen. She is on opioid with bowel regimen.   She has been using scopoline patches, but they loose effectiveness at 2.5 days. Will refill for 12 patches per month so patient can change before nausea starts.

## 2022-11-17 NOTE — Patient Instructions (Signed)
Take warfarin 1 1/2 tablets tonight and tomorrow night then resume 1 tablet daily   Recheck in 2 wks

## 2022-11-22 ENCOUNTER — Encounter: Payer: Self-pay | Admitting: Physical Medicine & Rehabilitation

## 2022-11-22 ENCOUNTER — Encounter: Payer: Commercial Managed Care - PPO | Attending: Registered Nurse | Admitting: Physical Medicine & Rehabilitation

## 2022-11-22 VITALS — BP 141/89 | HR 81 | Ht 65.0 in | Wt 221.0 lb

## 2022-11-22 DIAGNOSIS — M7989 Other specified soft tissue disorders: Secondary | ICD-10-CM | POA: Insufficient documentation

## 2022-11-22 DIAGNOSIS — I639 Cerebral infarction, unspecified: Secondary | ICD-10-CM | POA: Diagnosis present

## 2022-11-22 DIAGNOSIS — I890 Lymphedema, not elsewhere classified: Secondary | ICD-10-CM | POA: Insufficient documentation

## 2022-11-22 DIAGNOSIS — R519 Headache, unspecified: Secondary | ICD-10-CM | POA: Insufficient documentation

## 2022-11-22 DIAGNOSIS — Z79891 Long term (current) use of opiate analgesic: Secondary | ICD-10-CM | POA: Insufficient documentation

## 2022-11-22 DIAGNOSIS — S43002S Unspecified subluxation of left shoulder joint, sequela: Secondary | ICD-10-CM | POA: Insufficient documentation

## 2022-11-22 MED ORDER — TOPIRAMATE 25 MG PO TABS: 12.5000 mg | ORAL_TABLET | Freq: Every day | ORAL | 2 refills | Status: DC

## 2022-11-22 NOTE — Progress Notes (Signed)
Subjective:    Patient ID: Lauren [name], female    DOB: 04/21/1981, 42 y.o.   MRN: [id number]  HPI    Hospital Summary 10/19   Brief HPI:   Krystel Fletchall is a 42 y.o. female noted to the emergency room on 05/10/2021 with unsteady gait, left-sided facial droop and left arm weakness with slurred speech.  Code stroke called.  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  She underwent thrombectomy with TICI 3.  Cardiology consulted and recent echo revealed decrease in EF to approximately 30 to 35%.  She was noted to have elevated troponin with unremarkable EKG and was treated with nitroglycerin and heparin infusion.  ID was consulted for input due to history of left foot osteomyelitis as well as podiatry.  She underwent bone biopsy of fourth metatarsal left foot and broad-spectrum antibiotics were adjusted.  She did receive 1 unit PRBCs due to drop in H&H.  Hypercoagulable work-up was negative and she was started on Lovenox per hematology input.  Antibiotics were discontinued as cultures were negative.  Diet was advanced to dysphagia 3, leukocytosis was resolving with improvement in renal status.  PT/OT /ST was working with patient who continued to be limited by weakness as well as cognitive deficits.  CIR was recommended due to functional decline.     Hospital Course: Tazia Illescas was admitted to rehab 01/28/2022 for inpatient therapies to consist of PT, ST and OT at least three hours five days a week. Past admission physiatrist, therapy team and rehab RN have worked together to provide customized collaborative inpatient rehab. Diet advanced to regular textures. Seen by podiatry 9/30 and left films obtained. Some dehiscence of incision. New wound care orders placed.  Dr. Lilian Kapur saw the patient on 10/3 and allowed the patient to wear post-op shoe or CAM boot to WBAT.    Rapid response called at 1147 on 10/4 due to chest pain. NTG given  and EKG times 2 performed. No evidence of STE. Chest xray c/w volume overload and mild pulmonary edema. Cardiology resident consulted. Lasix 40 mg PO given. Creatinine bump to 2.26. Symptoms improved. BP overnight 110s-120s.  Lasix 10 mg PO daily started on 10/5. Also, hemoglobin drift to 7.0 although on recheck was 7.8. Transfused one unit PRBCs. Check FOBT and restart home Protonix. Creatinine slightly lower at 2.12. Fecal occult stool testing negative times 3 samples and follow up CBC showed H/H to be stable on DAPT as well as Lovenox for coagulopathy.  Dose has been  adjusted due to CKD.  She has had issues with left shoulder pain and sling was ordered for use as needed.  Tramadol has also been used on as needed basis for pain management.   Sudden onset of chest pain again on 10/9. NTG times 3 doses given without relief. Rapid response called. Stat EKG, troponin and CK ordered. Chest x-ray ok and trop below her baseline. Trazodone increased to 75 mg at bedtime for sleep. Increased to 100 mg on 10/12. Wanted to try something else for sleep so started on tizanidine 4 mg q HS.  Hemoglobin trending upward and 9.3 on 10/16. Lasix decreased to 20 mg daily on 10/16 due to elevation in serum creatinine. Consulted medicine service on 10/18 for assistance with kidney function, fluid balance and glucose control.  Strict I's and O's were recommended as patient did not appear to to be fluid overloaded.     Blood pressures were monitored on  TID basis and she was hypotensive on 10/2 without complaints. Midday hydralazine held. Cardiology consulted due to low EF and multiple meds. At admission, she is receiving hydralazine 12.5 mg TID, isosorbide 20 mg BID, Lopressor 37.5 mg BID but was complicated by multiple episodes of orthostatic hypotension with presyncope and cards was consulted multiple times to help with adjustment of medications. Compression with ACE wrap LE and TED hose RLE when OOB also used for BP support. Dr.  Mendel Corning felt that there was nothing further to add for patient's symptoms and recommended home with hospice. As BP stabilized, Lasix 40 mg was started for vascular congestion seen on CXR 10/5. Lopressor increase back to 37.5 mg BID.   Diabetes has been monitored with ac/hs CBG checks and SSI was use prn for tighter BS control. Levemir 25 units continued and Novolog 2 units TID with meals. Diabetic coordinator consulted for assistance with diet. Increased meal coverage to 5 units and added nighttime SSI.  Multiple changes in insulin management were made throughout her stay.  Her blood sugars continue to be poorly controlled as patient was not willing to comply with dietary restrictions during her stay.  Internal medicine recommended that patient's insulin pump be resumed as patient reported that this worked better for her.   Diabetic coordinator was consulted who reviewed endocrinology notes and pump was resumed per home regimen.  Both patient and family have been instructed on importance of diabetic management as well as dietary restrictions to provide fluid overload as well as better blood sugar control.  They are by her of need to contact endocrinology for further adjustment in her insulin protocol.  Patient has made progress during his stay but continues to be limited by left inattention, decrease in attention, problems with recall as well as overall safety awareness.  She will continue to receive further follow-up home health PT, OT, OT and RN by [facility] home health after discharge.   .   Rehab course: During patient's stay in rehab weekly team conferences were held to monitor patient's progress, set goals and discuss barriers to discharge. At admission, patient required X assist with basic self-care skills, moderate to total assist for mobility.   She has had improvement in activity tolerance, balance, postural control as well as ability to compensate for deficits. She has had improvement in functional use  LUE  and LLE as well as improvement in awareness.  She is able to complete ADL tasks with min assist.  She requires supervision for transfers and min assist to ambulate 30'with RW and left hand splint.  She requires min to mod assist with verbal and visual cues to complete functional and familiar tasks safely.  She is able to utilize compensatory swallow strategies at modified independent level.  Patient's safety is also intermittently impacted by mobility.  Family has been instructed on 24 hours supervision for safety. Family education has been completed regarding all aspects of care.        Interval history 04/19/2022 Ms. Harland Dingwall reports that she has been doing well overall since her discharge from CIR.  She has completed her home therapy.  She is walking much better  She sometimes needs a little assistance with dressing however is able to do it mostly on her own.  She reports she was seen by her PCP for follow-up.  She is also following with hematology.  She feels like her strength has improved significantly since her discharge.  She continues to have shoulder pain and uses tramadol ordered  by her PCP about once a day.  She reports her left leg is healing well and she is following with her surgeon for this.  She denies any issues with bowel or bladder control.   Interval history 05/31/22 Ms. Harland Dingwall is here for follow-up after her CVA.  She reports she continues to have pain in her left foot due to a foot ulcer.  She is following with wound care and Dr. Lilian Kapur podiatry for this issue.  She is also having severe pain in her left shoulder that is worsened with movement.  She reports that Dr. Estevan Oaks from neurology was considering her to be referred to PT for evaluation for possible vagal nerve stimulation treatment.  She continues to have spasticity in her left hand and wrist.  She is using oxycodone 5 mg as needed only about once a day for her shoulder and foot pain.     Interval history 05/31/22 Patient  presented for follow-up and shoulder cortisone injection.  Injection was rescheduled to later visit as patient reports she has a diabetic ulcer with significant MRSA infection and she is waiting on antibiotics to be started.  She continues to have severe pain in her left shoulder and left foot pain.  She was recently prescribed oxycodone 5 mg.  This does help her pain however causes too much sedation during the day so she only takes it at night.  We also discussed consideration of vivistim device.  She reports poor sleep and feels like trazodone was helpful in the hospital for this issue.   Interval history 08/20/22 Ms. Harland Dingwall is here for follow-up after her MCA CVA.  On 07/21/2022 she had a left foot fifth metatarsal resection.  She continues to follow with podiatry and reports being on antibiotics currently.  Ms. Zamarripa has continued pain in her left shoulder and arm.  She also has worsening swelling in her left upper extremity.  Additionally she continues to have a lot of pain in her left foot since the surgery.  She has been taking oxycodone mostly at night at a dose of 5 mg.  She says this is that when the pain is the worst.  This takes the edge off the pain however does not completely relieve it.  We discussed vagal nerve stimulation and she would be interested in an evaluation for this.   Interval history 09/21/2022 Ms. Harland Dingwall is here today for a left shoulder injection.  Patient had Vivistim trial completed and qualifies based on OT evaluation.  She is interested in proceeding with neurosurgical evaluation for this.  She continues to have swelling in her left upper extremity.  She has not yet tried compression sleeve because theone she purchased was too tight.  Ultrasound study was negative for DVT in her left upper extremity.  She continues to have a lot of pain in her shoulder and distal left upper extremity mostly at night.  She did not want to try duloxetine ordered last visit because she was  concerned about all the side effects that are possible.  She also had family members that did not tolerate duloxetine.  She continues to use oxycodone 5 mg at night on occasion, she feels like this is not strong enough to keep her pain controlled.     Interval history 11/22/2022 Ms. Marcelli is here for follow-up regarding her recent MCA CVA.  Patient reports she was recently admitted for NSTEMI.  She continues to have a lot of swelling in her left upper extremity.  She  has follow-up scheduled with vascular surgery in about a week regarding lower extremity perfusion.  She continues to have pain in her left shoulder largely unchanged from prior visit and not significantly improved with shoulder injection.  She continues to have pain and soreness in her upper arm and shoulder.  Oxycodone continues to help reduce the pain at night.  Gabapentin was discontinued due to the dizziness.  She had a recent fall where she had a thumb fracture on the left.  She continues to have a lot of tightness in her finger flexors and forearm pronators on the left.  She has not been wearing her compression sleeve on her left upper extremity because it is too hard to get on her hand.  She also has been having frequent headache in the right frontal and left occipital regions.  She says she had frequent headaches for years however this worsened after her CVA.   Pain Inventory Average Pain 7 Pain Right Now 7 My pain is constant, stabbing, and aching  In the last 24 hours, has pain interfered with the following? General activity 5 Relation with others 4 Enjoyment of life 0 What TIME of day is your pain at its worst? night Sleep (in general) Poor  Pain is worse with: unsure Pain improves with: medication Relief from Meds: 1  Family History  Problem Relation Age of Onset   Thyroid disease Mother    Hypertension Mother    Hyperlipidemia Mother    CAD Mother    CVA Mother    Hyperlipidemia Father    Hypertension Father     CAD Father    Breast cancer Paternal Grandmother 28   Cancer Paternal Grandmother        lung   Social History   Socioeconomic History   Marital status: Married    Spouse name: Harvel Quale   Number of children: 2   Years of education: 12   Highest education level: 12th grade  Occupational History    Comment: Full time   Occupation: CNA at [employer]    Comment: applying for disability  Tobacco Use   Smoking status: Former    Current packs/day: 0.00    Average packs/day: 1 pack/day for 15.0 years (15.0 ttl pk-yrs)    Types: Cigarettes    Start date: 01/2007    Quit date: 01/2022    Years since quitting: 0.8    Passive exposure: Past   Smokeless tobacco: Never  Vaping Use   Vaping status: Never Used  Substance and Sexual Activity   Alcohol use: Yes    Comment: occ   Drug use: No   Sexual activity: Yes    Birth control/protection: Surgical    Comment: tubal   Other Topics Concern   Not on file  Social History Narrative   Lives with husband and kids   Right handed   Drinks 9+ cups caffeine daily   Social Determinants of Health   Financial Resource Strain: Low Risk  (11/09/2022)   Overall Financial Resource Strain (CARDIA)    Difficulty of Paying Living Expenses: Not very hard  Food Insecurity: No Food Insecurity (11/16/2022)   Hunger Vital Sign    Worried About Running Out of Food in the Last Year: Never true    Ran Out of Food in the Last Year: Never true  Transportation Needs: No Transportation Needs (11/16/2022)   PRAPARE - Administrator, Civil Service (Medical): No    Lack of Transportation (Non-Medical): No  Physical Activity: Inactive (03/03/2022)   Exercise Vital Sign    Days of Exercise per Week: 0 days    Minutes of Exercise per Session: 0 min  Stress: No Stress Concern Present (03/03/2022)   [employer] of Occupational Health - Occupational Stress Questionnaire    Feeling of Stress : Only a little  Recent Concern: Stress - Stress Concern  Present (12/22/2021)   [employer] of Occupational Health - Occupational Stress Questionnaire    Feeling of Stress : To some extent  Social Connections: Socially Integrated (03/03/2022)   Social Connection and Isolation Panel [NHANES]    Frequency of Communication with Friends and Family: More than three times a week    Frequency of Social Gatherings with Friends and Family: More than three times a week    Attends Religious Services: More than 4 times per year    Active Member of Clubs or Organizations: Yes    Attends Banker Meetings: More than 4 times per year    Marital Status: Married   Past Surgical History:  Procedure Laterality Date   ABDOMINAL AORTOGRAM W/LOWER EXTREMITY N/A 06/10/2022   Procedure: ABDOMINAL AORTOGRAM W/LOWER EXTREMITY;  Surgeon: Cephus Shelling, MD;  Location: [facility];  Service: Cardiovascular;  Laterality: N/A;   AMPUTATION Left 09/02/2021   Procedure: AMPUTATION RAY;  Surgeon: Edwin Cap, DPM;  Location: [facility] OR;  Service: Podiatry;  Laterality: Left;  sagittal saw, 3L bag saline & Pulse   Cardiac catherization     CHOLECYSTECTOMY     I & D EXTREMITY Left 08/30/2021   Procedure: IRRIGATION AND DEBRIDEMENT WITH BONE BIOPSY;  Surgeon: Candelaria Stagers, DPM;  Location: [facility] OR;  Service: Podiatry;  Laterality: Left;   IR ANGIO VERTEBRAL SEL SUBCLAVIAN INNOMINATE UNI L MOD SED  01/18/2022   IR CT HEAD LTD  01/08/2022   IR INTRA CRAN STENT  01/08/2022   IR PERCUTANEOUS ART THROMBECTOMY/INFUSION INTRACRANIAL INC DIAG ANGIO  01/08/2022   IRRIGATION AND DEBRIDEMENT FOOT Left 09/04/2021   Procedure: IRRIGATION AND DEBRIDEMENT FOOT AND CLOSURE;  Surgeon: Edwin Cap, DPM;  Location: [facility];  Service: Podiatry;  Laterality: Left;   LEFT HEART CATH AND CORONARY ANGIOGRAPHY N/A 07/26/2022   Procedure: LEFT HEART CATH AND CORONARY ANGIOGRAPHY;  Surgeon: Swaziland, Peter M, MD;  Location: [facility] INVASIVE CV LAB;  Service: Cardiovascular;  Laterality: N/A;    METATARSAL OSTEOTOMY Left 07/21/2022   Procedure: LEFT FOOT EXCISION OF FIFTH METATARSAL WITH DEBRIDEMENT OF ULCER, POSSIBLE BONE BIOPSY OF THE FOURTH METATARSAL, POSSIBLE FOURTH RAY AMPUTATION OF LEFT TOEAND METATARSAL;  Surgeon: Vivi Barrack, DPM;  Location: [facility];  Service: Podiatry;  Laterality: Left;   PERIPHERAL VASCULAR BALLOON ANGIOPLASTY Left 06/10/2022   Procedure: PERIPHERAL VASCULAR BALLOON ANGIOPLASTY;  Surgeon: Cephus Shelling, MD;  Location: [facility];  Service: Cardiovascular;  Laterality: Left;  AT and DP   PERIPHERAL VASCULAR INTERVENTION Left 06/10/2022   Procedure: PERIPHERAL VASCULAR INTERVENTION;  Surgeon: Cephus Shelling, MD;  Location: [facility] INVASIVE CV LAB;  Service: Cardiovascular;  Laterality: Left;  SFA   RADIOLOGY WITH ANESTHESIA N/A 01/08/2022   Procedure: IR WITH ANESTHESIA;  Surgeon: Radiologist, Medication, MD;  Location: [facility];  Service: Radiology;  Laterality: N/A;   THROMBECTOMY BRACHIAL ARTERY Right 11/18/2021   Procedure: RIGHT BRACHIAL, RADIAL, & ULNAR ARTERY THROMBECTOMY.;  Surgeon: Cephus Shelling, MD;  Location: [facility];  Service: Vascular;  Laterality: Right;   TUBAL LIGATION     Past  Surgical History:  Procedure Laterality Date   ABDOMINAL AORTOGRAM W/LOWER EXTREMITY N/A 06/10/2022   Procedure: ABDOMINAL AORTOGRAM W/LOWER EXTREMITY;  Surgeon: Cephus Shelling, MD;  Location: [facility];  Service: Cardiovascular;  Laterality: N/A;   AMPUTATION Left 09/02/2021   Procedure: AMPUTATION RAY;  Surgeon: Edwin Cap, DPM;  Location: [facility];  Service: Podiatry;  Laterality: Left;  sagittal saw, 3L bag saline & Pulse   Cardiac catherization     CHOLECYSTECTOMY     I & D EXTREMITY Left 08/30/2021   Procedure: IRRIGATION AND DEBRIDEMENT WITH BONE BIOPSY;  Surgeon: Candelaria Stagers, DPM;  Location: [facility];  Service: Podiatry;  Laterality: Left;   IR ANGIO VERTEBRAL SEL SUBCLAVIAN INNOMINATE UNI L MOD SED  01/18/2022   IR CT HEAD LTD   01/08/2022   IR INTRA CRAN STENT  01/08/2022   IR PERCUTANEOUS ART THROMBECTOMY/INFUSION INTRACRANIAL INC DIAG ANGIO  01/08/2022   IRRIGATION AND DEBRIDEMENT FOOT Left 09/04/2021   Procedure: IRRIGATION AND DEBRIDEMENT FOOT AND CLOSURE;  Surgeon: Edwin Cap, DPM;  Location: [facility];  Service: Podiatry;  Laterality: Left;   LEFT HEART CATH AND CORONARY ANGIOGRAPHY N/A 07/26/2022   Procedure: LEFT HEART CATH AND CORONARY ANGIOGRAPHY;  Surgeon: Swaziland, Peter M, MD;  Location: [facility] INVASIVE CV LAB;  Service: Cardiovascular;  Laterality: N/A;   METATARSAL OSTEOTOMY Left 07/21/2022   Procedure: LEFT FOOT EXCISION OF FIFTH METATARSAL WITH DEBRIDEMENT OF ULCER, POSSIBLE BONE BIOPSY OF THE FOURTH METATARSAL, POSSIBLE FOURTH RAY AMPUTATION OF LEFT TOEAND METATARSAL;  Surgeon: Vivi Barrack, DPM;  Location: [facility];  Service: Podiatry;  Laterality: Left;   PERIPHERAL VASCULAR BALLOON ANGIOPLASTY Left 06/10/2022   Procedure: PERIPHERAL VASCULAR BALLOON ANGIOPLASTY;  Surgeon: Cephus Shelling, MD;  [facility]: [facility];  Service: Cardiovascular;  Laterality: Left;  AT and DP   PERIPHERAL VASCULAR INTERVENTION Left 06/10/2022   Procedure: PERIPHERAL VASCULAR INTERVENTION;  Surgeon: Cephus Shelling, MD;  Location: [facility] INVASIVE CV LAB;  Service: Cardiovascular;  Laterality: Left;  SFA   RADIOLOGY WITH ANESTHESIA N/A 01/08/2022   Procedure: IR WITH ANESTHESIA;  Surgeon: Radiologist, Medication, MD;  Location: [facility];  Service: Radiology;  Laterality: N/A;   THROMBECTOMY BRACHIAL ARTERY Right 11/18/2021   Procedure: RIGHT BRACHIAL, RADIAL, & ULNAR ARTERY THROMBECTOMY.;  Surgeon: Cephus Shelling, MD;  Location: [facility];  Service: Vascular;  Laterality: Right;   TUBAL LIGATION     Past Medical History:  Diagnosis Date   Anemia    CAD (coronary artery disease)    a. s/p cath in 03/2014 showing 30% mid-LAD, moderate to severe disease along small D1, patent LCx, moderate to severe distal OM2 stenosis and  moderate diffuse diease along RCA not amenable to PCI   CHF (congestive heart failure) (HCC)    a. EF 55-60% in 12/2019 b. EF at 35-40% by echo in 05/2020   CKD (chronic kidney disease) stage 4, GFR 15-29 ml/min (HCC)    Diabetes mellitus without complication (HCC)    Myocardial infarction (HCC)    Neuropathy    PVD (peripheral vascular disease) (HCC)    Stroke (HCC) 01/2022   L hand weakness   BP (!) 141/89   Pulse 81   Ht 5\' 5"  (1.651 m)   Wt 221 lb (100.2 kg)   LMP 11/05/2022 (Approximate)   SpO2 97%   BMI 36.78 kg/m   Opioid Risk Score:   Fall Risk Score:  `1  Depression screen [facility] 2/9  11/17/2022    1:52 PM 10/28/2022   10:47 AM 10/11/2022    9:24 AM 09/21/2022   10:43 AM 09/20/2022    3:51 PM 09/08/2022    3:05 PM 08/20/2022    9:45 AM  Depression screen PHQ 2/9  Decreased Interest 0 0 0 0 0 0 0  Down, Depressed, Hopeless 1 0 1 0 2 1 0  PHQ - 2 Score 1 0 1 0 2 1 0  Altered sleeping 3 3 2  3 3    Tired, decreased energy 3 1 0  0 3   Change in appetite 0 0 0  0 0   Feeling bad or failure about yourself  1 0 0  0 0   Trouble concentrating 1 1 0  0 0   Moving slowly or fidgety/restless 0 0 1  0 0   Suicidal thoughts 0 0 0  0 0   PHQ-9 Score 9 5 4  5 7       Review of Systems  Gastrointestinal:  Positive for nausea.  Musculoskeletal:  Positive for back pain and neck pain.       Left foot pain  Neurological:  Positive for headaches.  All other systems reviewed and are negative.     Objective:   Physical Exam   Gen: no distress, normal appearing, sitting in wheelchair HEENT: oral mucosa pink and moist, NCAT Cardio: Reg rate Chest: normal effort, normal rate of breathing Abd: soft, non-distended Ext: Significant edema noted in LUE Psych: pleasant, normal affect Skin: intact Neuro: Alert and awake, makes eye contact, follows commands, wearing post-op shoe on the left Strength 5 out of 5 right upper extremity and right lower extremity Strength 3 out of 5  left shoulder abduction and elbow extension, elbow flexion 3  out of 5, finger flexion 3-4 out of 5 Strength left lower extremity 4 out of 5 throughout -Sensation intact light touch in all 4 extremities Musculoskeletal: Subluxation left shoulder, pain with shoulder abduction around end range of motion. Tenderness to palpation around her left shoulder She does not appear to have diffuse tenderness in her left upper extremity today, she does have soreness around her left thumb fracture site Increased tone in finger flexors  elbow flexors and pronators left upper extremity        Assessment & Plan:    1. Right MCA infarct status post thrombectomy, right ICA dissection with stent placement. -Patient is working with neurosurgery regarding there was some option, I think this would be a great idea to try when medically stable   2 shoulder subluxation left and shoulder pain/arm pain -Continue Voltaren gel -No significant improvement with recent left shoulder injection last visit -Patient reports oxycodone is helping shoulder pain in addition to left foot pain  -UDS , pain agreement prior visit -Continue oxycodone 7.5 mg daily, call when needing refill -DC duloxetine per patient request -Continue to monitor UDS, PDMP, pill counts-consistant   3 Spasticity left upper extremity.   -Will plan to try Botox for finger flexors and pronator teres next visit, appears to have some pain when stretching these muscles   4. : Left foot osteomyelitis s/p ray amputation  -Continue follow-up with Dr. Lilian Kapur, she reports no longer on antibiotics   5. LUE edema -Advised to get compression sleeve-she reports she has difficulty putting this on -Patient has upcoming appointment with vascular surgery, advised to discuss this with vascular -Place consult for lymphedema therapy today  6.  CAD with recent STEMI -Follow-up  with PCP as directed   7) Frequent headaches -Start topamax 12.5mg , can increase to  25mg  Daily after 1 week if tolerating, may also provide benefit for neuropathic pain

## 2022-11-29 ENCOUNTER — Ambulatory Visit: Payer: Commercial Managed Care - PPO | Admitting: Podiatry

## 2022-11-29 DIAGNOSIS — L97522 Non-pressure chronic ulcer of other part of left foot with fat layer exposed: Secondary | ICD-10-CM | POA: Diagnosis not present

## 2022-11-29 DIAGNOSIS — M21962 Unspecified acquired deformity of left lower leg: Secondary | ICD-10-CM | POA: Diagnosis not present

## 2022-11-29 DIAGNOSIS — B351 Tinea unguium: Secondary | ICD-10-CM | POA: Diagnosis not present

## 2022-11-29 DIAGNOSIS — M79674 Pain in right toe(s): Secondary | ICD-10-CM | POA: Diagnosis not present

## 2022-11-29 DIAGNOSIS — M79675 Pain in left toe(s): Secondary | ICD-10-CM | POA: Diagnosis not present

## 2022-11-29 NOTE — Progress Notes (Unsigned)
HISTORY AND PHYSICAL     CC:  follow up. Requesting Provider:  Billie Lade, MD  HPI: This is a 42 y.o. female who is here today for follow up for PAD.  Pt has hx of CO2 angiogram with left ATA, DP and SFA angioplasty and stenting of left SFA on 06/10/2022 by Dr. Chestine Spore for CLI of LLE with tissue loss.  She was noted to have significant small vessel disease.  She underwent excision of left 5th metatarsal with debridement of ulcer and 4th ray amputation for osteomyelitis by Dr. Ardelle Anton.   She has hx of acute ischemic RUE and underwent right brachial, radial, and ulnar thrombectomy on 11/18/2021 by Dr. Chestine Spore.  Cardio embolic work up did not reveal source and she was referred to hem-onc for further work up.   Pt was last seen 08/31/2022 and at that time, she was not having any pain with ambulation, rest pain or new wounds.   She did have some recurrent disease left ATA with 50-74% stenosis and left SFA stent was patent.  She was continued on statin/plavix/coumadin.   The pt returns today for follow up.  She states that she saw the podiatrist yesterday and got a good report.  She denies any claudication or rest pain.  Her have healed on her foot.  She continues to have swelling in the left arm.  She states that she has had swelling in the arm since her stroke.  She had a LUE duplex in Arpril 2024 that was negative for DVT.  She is on coumadin. She does not have a PPM or any device in the left chest. She has not smoked since her stroke.  She it compliant with her plavix and statin.   The pt is on a statin for cholesterol management.    The pt is not on an aspirin.    Other AC:  plavix/coumadin The pt id on diuretic, hydralazine, BB for hypertension.  The pt is on medication for diabetes. Tobacco hx:  former  Pt does not have family hx of AAA.  Past Medical History:  Diagnosis Date   Anemia    CAD (coronary artery disease)    a. s/p cath in 03/2014 showing 30% mid-LAD, moderate to severe  disease along small D1, patent LCx, moderate to severe distal OM2 stenosis and moderate diffuse diease along RCA not amenable to PCI   CHF (congestive heart failure) (HCC)    a. EF 55-60% in 12/2019 b. EF at 35-40% by echo in 05/2020   CKD (chronic kidney disease) stage 4, GFR 15-29 ml/min (HCC)    Diabetes mellitus without complication (HCC)    Myocardial infarction (HCC)    Neuropathy    PVD (peripheral vascular disease) (HCC)    Stroke (HCC) 01/2022   L hand weakness    Past Surgical History:  Procedure Laterality Date   ABDOMINAL AORTOGRAM W/LOWER EXTREMITY N/A 06/10/2022   Procedure: ABDOMINAL AORTOGRAM W/LOWER EXTREMITY;  Surgeon: Cephus Shelling, MD;  Location: [facility];  Service: Cardiovascular;  Laterality: N/A;   AMPUTATION Left 09/02/2021   Procedure: AMPUTATION RAY;  Surgeon: Edwin Cap, DPM;  Location: [facility];  Service: Podiatry;  Laterality: Left;  sagittal saw, 3L bag saline & Pulse   Cardiac catherization     CHOLECYSTECTOMY     I & D EXTREMITY Left 08/30/2021   Procedure: IRRIGATION AND DEBRIDEMENT WITH BONE BIOPSY;  Surgeon: Candelaria Stagers, DPM;  Location: [facility];  Service: Podiatry;  Laterality: Left;   IR ANGIO VERTEBRAL SEL SUBCLAVIAN INNOMINATE UNI L MOD SED  01/18/2022   IR CT HEAD LTD  01/08/2022   IR INTRA CRAN STENT  01/08/2022   IR PERCUTANEOUS ART THROMBECTOMY/INFUSION INTRACRANIAL INC DIAG ANGIO  01/08/2022   IRRIGATION AND DEBRIDEMENT FOOT Left 09/04/2021   Procedure: IRRIGATION AND DEBRIDEMENT FOOT AND CLOSURE;  Surgeon: Edwin Cap, DPM;  Location: [facility];  Service: Podiatry;  Laterality: Left;   LEFT HEART CATH AND CORONARY ANGIOGRAPHY N/A 07/26/2022   Procedure: LEFT HEART CATH AND CORONARY ANGIOGRAPHY;  Surgeon: [place], Peter M, MD;  Location: [facility] INVASIVE CV [facility];  Service: Cardiovascular;  Laterality: N/A;   METATARSAL OSTEOTOMY Left 07/21/2022   Procedure: LEFT FOOT EXCISION OF FIFTH METATARSAL WITH DEBRIDEMENT OF ULCER, POSSIBLE BONE BIOPSY  OF THE FOURTH METATARSAL, POSSIBLE FOURTH RAY AMPUTATION OF LEFT TOEAND METATARSAL;  Surgeon: Vivi Barrack, DPM;  Location: [facility];  Service: Podiatry;  Laterality: Left;   PERIPHERAL VASCULAR BALLOON ANGIOPLASTY Left 06/10/2022   Procedure: PERIPHERAL VASCULAR BALLOON ANGIOPLASTY;  Surgeon: Cephus Shelling, MD;  Location: [facility];  Service: Cardiovascular;  Laterality: Left;  AT and DP   PERIPHERAL VASCULAR INTERVENTION Left 06/10/2022   Procedure: PERIPHERAL VASCULAR INTERVENTION;  Surgeon: Cephus Shelling, MD;  Location: [facility];  Service: Cardiovascular;  Laterality: Left;  SFA   RADIOLOGY WITH ANESTHESIA N/A 01/08/2022   Procedure: IR WITH ANESTHESIA;  Surgeon: Radiologist, Medication, MD;  Location: [facility];  Service: Radiology;  Laterality: N/A;   THROMBECTOMY BRACHIAL ARTERY Right 11/18/2021   Procedure: RIGHT BRACHIAL, RADIAL, & ULNAR ARTERY THROMBECTOMY.;  Surgeon: Cephus Shelling, MD;  Location: [facility];  Service: Vascular;  Laterality: Right;   TUBAL LIGATION      Allergies  Allergen Reactions   Visipaque [Iodixanol] Nausea And Vomiting    Patient had vagal response during procedure became hypertensive, and bradycardic. CO2 used during procedure prior to contrast being used, this may be cause as well. Recommended premedicating prior to contrast being used in the future.    Wellbutrin [Bupropion] Hives   Gabapentin     dizzy   Cefepime Rash    Tolerates penicilllin   Ciprofloxacin Hcl Hives and Rash    Hives/rash at injection site; 01/15/22 tolerated IV cipro   Tape Rash    Current Outpatient Medications  Medication Sig Dispense Refill   acetaminophen (TYLENOL) 325 MG tablet Take 1-2 tablets (325-650 mg total) by mouth every 4 (four) hours as needed for mild pain. (Patient taking differently: Take 650 mg by mouth as needed for mild pain.) 100 tablet 0   atorvastatin (LIPITOR) 40 MG tablet Take 1 tablet (40 mg total) by mouth daily. Courtesy fill/  pt to get established with a primary MD for further refills 90 tablet 1   clopidogrel (PLAVIX) 75 MG tablet Take 1 tablet (75 mg total) by mouth daily. 30 tablet 11   furosemide (LASIX) 40 MG tablet Take 40 mg daily as needed for swelling 90 tablet 3   hydrALAZINE (APRESOLINE) 25 MG tablet Take 1 tablet (25 mg total) by mouth every 8 (eight) hours. 90 tablet 1   insulin aspart (NOVOLOG) 100 UNIT/ML injection Use with Omnipod for daily dose around 100 units daily (Patient taking differently: Inject 0-100 Units into the skin See admin instructions. Use with Omnipod) 80 mL 3   Insulin Disposable Pump (OMNIPOD 5 G6 PODS, GEN 5,) MISC Change pod every 48-72 hours. 100 each 0  isosorbide mononitrate (IMDUR) 60 MG 24 hr tablet Take 2 tablets (120 mg total) by mouth daily. 60 tablet 1   levothyroxine (SYNTHROID) 125 MCG tablet Take 1 tablet (125 mcg total) by mouth daily at 6 (six) AM. 90 tablet 3   lidocaine (HM LIDOCAINE PATCH) 4 % Place 1 patch onto the skin daily. (Patient taking differently: Place 1 patch onto the skin daily as needed (Pain).) 10 patch 0   metoprolol succinate (TOPROL-XL) 100 MG 24 hr tablet Take 1 tablet (100 mg total) by mouth daily. Take with or immediately following a meal. 30 tablet 1   Multiple Vitamin (MULTIVITAMIN WITH MINERALS) TABS tablet Take 1 tablet by mouth daily.     nitroGLYCERIN (NITROSTAT) 0.4 MG SL tablet Place 1 tablet (0.4 mg total) under the tongue every 5 (five) minutes x 3 doses as needed for chest pain. 25 tablet 3   oxyCODONE-acetaminophen (PERCOCET) 7.5-325 MG tablet Take 1 tablet by mouth daily as needed for severe pain. 30 tablet 0   pantoprazole (PROTONIX) 40 MG tablet Take 1 tablet (40 mg total) by mouth daily. Courtesy fill/ pt to get established with a primary MD for further refills (Patient taking differently: Take 40 mg by mouth daily as needed (reflux).) 90 tablet 1   ranolazine (RANEXA) 1000 MG SR tablet Take 1 tablet (1,000 mg total) by mouth 2 (two)  times daily. 60 tablet 1   scopolamine (TRANSDERM-SCOP) 1 MG/3DAYS Place 1 patch (1.5 mg total) onto the skin every 3 (three) days. 12 patch 2   senna-docusate (SENOKOT-S) 8.6-50 MG tablet Take 1-2 tablets by mouth daily as needed for mild constipation. 30 tablet 0   tiZANidine (ZANAFLEX) 4 MG tablet TAKE (1) TABLET BY MOUTH AT BEDTIME. 30 tablet 0   topiramate (TOPAMAX) 25 MG tablet Take 0.5-1 tablets (12.5-25 mg total) by mouth at bedtime. 30 tablet 2   traZODone (DESYREL) 100 MG tablet Take 1 tablet (100 mg total) by mouth at bedtime. 90 tablet 0   warfarin (COUMADIN) 5 MG tablet TAKE ONE TABLET BY MOUTH DAILY EXCEPT 1 AND 1/2 TABLET ON WEDNESDAYS AND SATURDAYS OR AS DIRECTED (Patient taking differently: Take 5 mg by mouth every evening.) 40 tablet 5   No current facility-administered medications for this visit.    Family History  Problem Relation Age of Onset   Thyroid disease Mother    Hypertension Mother    Hyperlipidemia Mother    CAD Mother    CVA Mother    Hyperlipidemia Father    Hypertension Father    CAD Father    Breast cancer Paternal Grandmother 5   Cancer Paternal Grandmother        lung    Social History   Socioeconomic History   Marital status: Married    Spouse name: Harvel Quale   Number of children: 2   Years of education: 12   Highest education level: 12th grade  Occupational History    Comment: Full time   Occupation: CNA at [employer]    Comment: applying for disability  Tobacco Use   Smoking status: Former    Current packs/day: 0.00    Average packs/day: 1 pack/day for 15.0 years (15.0 ttl pk-yrs)    Types: Cigarettes    Start date: 01/2007    Quit date: 01/2022    Years since quitting: 0.9    Passive exposure: Past   Smokeless tobacco: Never  Vaping Use   Vaping status: Never Used  Substance and Sexual Activity  Alcohol use: Yes    Comment: occ   Drug use: No   Sexual activity: Yes    Birth control/protection: Surgical    Comment: tubal    Other Topics Concern   Not on file  Social History Narrative   Lives with husband and kids   Right handed   Drinks 9+ cups caffeine daily   Social Determinants of Health   Financial Resource Strain: Low Risk  (11/09/2022)   Overall Financial Resource Strain (CARDIA)    Difficulty of Paying Living Expenses: Not very hard  Food Insecurity: No Food Insecurity (11/16/2022)   Hunger Vital Sign    Worried About Running Out of Food in the Last Year: Never true    Ran Out of Food in the Last Year: Never true  Transportation Needs: No Transportation Needs (11/16/2022)   PRAPARE - Administrator, Civil Service (Medical): No    Lack of Transportation (Non-Medical): No  Physical Activity: Inactive (03/03/2022)   Exercise Vital Sign    Days of Exercise per Week: 0 days    Minutes of Exercise per Session: 0 min  Stress: No Stress Concern Present (03/03/2022)   [employer] of Occupational Health - Occupational Stress Questionnaire    Feeling of Stress : Only a little  Recent Concern: Stress - Stress Concern Present (12/22/2021)   [employer] of Occupational Health - Occupational Stress Questionnaire    Feeling of Stress : To some extent  Social Connections: Socially Integrated (03/03/2022)   Social Connection and Isolation Panel [NHANES]    Frequency of Communication with Friends and Family: More than three times a week    Frequency of Social Gatherings with Friends and Family: More than three times a week    Attends Religious Services: More than 4 times per year    Active Member of [employer] or Organizations: Yes    Attends Engineer, structural: More than 4 times per year    Marital Status: Married  Catering manager Violence: Not At Risk (11/07/2022)   Humiliation, Afraid, Rape, and Kick questionnaire    Fear of Current or Ex-Partner: No    Emotionally Abused: No    Physically Abused: No    Sexually Abused: No     REVIEW OF SYSTEMS:   [X]  denotes positive  finding, [ ]  denotes negative finding Cardiac  Comments:  Chest pain or chest pressure:    Shortness of breath upon exertion:    Short of breath when lying flat:    Irregular heart rhythm:        Vascular    Pain in calf, thigh, or hip brought on by ambulation:    Pain in feet at night that wakes you up from your sleep:     Blood clot in your veins:    arm swelling:         Pulmonary    Oxygen at home:    Productive cough:     Wheezing:         Neurologic    Sudden weakness in arms or legs:     Sudden numbness in arms or legs:     Sudden onset of difficulty speaking or slurred speech:    Temporary loss of vision in one eye:     Problems with dizziness:         Gastrointestinal    Blood in stool:     Vomited blood:         Genitourinary    Burning  when urinating:     Blood in urine:        Psychiatric    Major depression:         Hematologic    Bleeding problems:    Problems with blood clotting too easily:        Skin    Rashes or ulcers:        Constitutional    Fever or chills:      PHYSICAL EXAMINATION:  Today's Vitals   11/30/22 1007 11/30/22 1013  BP: (!) 153/96   Pulse: 86   Resp: 18   Temp: 97.9 F (36.6 C)   TempSrc: Temporal   SpO2: 99%   Weight: 225 lb 14.4 oz (102.5 kg)   Height: 5\' 5"  (1.651 m)   PainSc:  6    Body mass index is 37.59 kg/m.   General:  WDWN in NAD; vital signs documented above Gait: Not observed HENT: WNL, normocephalic Pulmonary: normal non-labored breathing , without wheezing Cardiac: regular HR, without carotid bruits Abdomen: soft, NT; aortic pulse is not palpable Skin: without rashes; well healed scar lateral left foot Vascular Exam/Pulses:  Right Left  Radial 2+ (normal) 2+ (normal)  Popliteal Unable to palpate Unable to palpate  DP Brisk biphasic Brisk biphasic  PT Unable to obtain Brisk monophasic  Peroneal Brisk biphasic Brisk biphasic   Extremities: without ischemic changes, without Gangrene ,  without cellulitis; without open wounds Musculoskeletal: no muscle wasting or atrophy  Neurologic: A&O X 3 Psychiatric:  The pt has Normal affect.   Non-Invasive Vascular Imaging:   ABI's/TBI's on 11/30/2022: Right:  [place]/0.56 - Great toe pressure: 90 Left:  [place]/0.55 - Great toe pressure: 89  Arterial duplex on 11/30/2022: +-----------+--------+-----+---------------+----------+----------+  LEFT      PSV cm/sRatioStenosis       Waveform  Comments    +-----------+--------+-----+---------------+----------+----------+  EIA Distal 102                         triphasic             +-----------+--------+-----+---------------+----------+----------+  CFA Prox   86                          triphasic             +-----------+--------+-----+---------------+----------+----------+  CFA Distal 83                          triphasic             +-----------+--------+-----+---------------+----------+----------+  DFA       88                          biphasic              +-----------+--------+-----+---------------+----------+----------+  SFA Prox   149                         triphasic             +-----------+--------+-----+---------------+----------+----------+  SFA Mid    126                         triphasic stent area  +-----------+--------+-----+---------------+----------+----------+  SFA Distal 158          30-49% stenosistriphasic             +-----------+--------+-----+---------------+----------+----------+  POP Prox   112                         triphasic             +-----------+--------+-----+---------------+----------+----------+  POP Mid    66                          triphasic             +-----------+--------+-----+---------------+----------+----------+  POP Distal 68                          triphasic             +-----------+--------+-----+---------------+----------+----------+  ATA Distal 50                           monophasic            +-----------+--------+-----+---------------+----------+----------+  PTA Distal 18                          monophasic            +-----------+--------+-----+---------------+----------+----------+  PERO Distal29                          monophasic            +-----------+--------+-----+---------------+----------+----------+  Left Stent(s):  +---------------+---++---------++  Prox to Stent  151triphasic  +---------------+---++---------++  Proximal Stent 148triphasic  +---------------+---++---------++  Mid Stent      139triphasic  +---------------+---++---------++  Distal Stent   71 triphasic  +---------------+---++---------++  Distal to Stent84 triphasic  +---------------+---++---------++  Summary:  Left: Patent left SFA stent with no visualized stenosis.    Previous ABI's/TBI's on 07/20/2022: Right:  0.94/0.67 - Great toe pressure: 88 Left:  0.88/[place] - Great toe pressure:  [place]  Previous arterial duplex on 07/20/2022: Left Stent(s):  +---------------+---++----------++  Prox to Stent   +---------------+---++----------++  Proximal Stent  +---------------+---++----------++  Mid Stent       +---------------+---++----------++  Distal Stent    +---------------+---++----------++  Distal to Stent67monophasic  +---------------+---++----------++   +-----------+--------+-----+---------------+----------+--------------------  ----+  LEFT      PSV cm/sRatioStenosis       Waveform  Comments                   +-----------+--------+-----+---------------+----------+--------------------  CFA Mid    87                          monophasic                     +-----------+--------+-----+---------------+----------+--------------------  CFA Distal 109                         monophasic                      +-----------+--------+-----+---------------+----------+--------------------  DFA       90                          monophasic                     +-----------+--------+-----+---------------+----------+--------------------  SFA Prox   135                                                             +-----------+--------+-----+---------------+----------+--------------------  SFA Mid                                          Mid SFA Stent        +-----------+--------+-----+---------------+----------+--------------------  SFA Distal 116                         monophasic                     +-----------+--------+-----+---------------+----------+--------------------  POP Prox   151                         monophasic                     +-----------+--------+-----+---------------+----------+--------------------  POP Distal 94                          monophasic                     +-----------+--------+-----+---------------+----------+--------------------   TP Trunk   106                         monophasic                     +-----------+--------+-----+---------------+----------+--------------------  ATA Prox   57                          monophasic                     +-----------+--------+-----+---------------+----------+--------------------  ATA Mid    93                          monophasic                     +-----------+--------+-----+---------------+----------+--------------------  -ATA Distal 329          50-74% stenosismonophasic3.5 ratio            +-----------+--------+-----+---------------+----------+--------------------   PTA Prox   27                          monophasic areas of occlusive disease with collateralization      +-----------+--------+-----+---------------+----------+--------------------  PTA Mid    53                          monophasic                      +-----------+--------+-----+---------------+----------+--------------------  PTA Distal 58                          monophasic                     +-----------+--------+-----+---------------+----------+--------------------  PERO Prox  70                          monophasic                     +-----------+--------+-----+---------------+----------+-------------------- PERO Mid  64                          monophasic                     +-----------+--------+-----+---------------+----------+--------------------  PERO Distal43                          monophasic                     +-----------+--------+-----+---------------+----------+--------------------  DP        104                                                        +-----------+--------+-----+---------------+----------+--------------------   Summary:  Left: 50-74% stenosis noted in the anterior tibial artery. Patent left mid  SFA stent.     ASSESSMENT/PLAN:: 42 y.o. female here for follow up for PAD with hx of CO2 angiogram with left ATA, DP and SFA angioplasty and stenting of left SFA on 06/10/2022 by Dr. Chestine Spore for CLI of LLE with tissue loss.  She was noted to have significant small vessel disease.  She underwent excision of left 5th metatarsal with debridement of ulcer and 4th ray amputation for osteomyelitis by Dr. Ardelle Anton.   PAD -pt ABI are non compressible.  TBI on the left is improved.  She has brisk doppler flow in both feet.  Continue ambulating -continue statin/coumadin/plavix -fortunately she quit smoking in September 2023 -pt will f/u in 6 months with LLE arterial duplex and ABI.  Left arm swelling -pt has had arm swelling since her stroke in September 2023.  She had a negative DVT study in April 2024.  She is also on coumadin.  She does not have any implantable devices in the left chest.  Discussed with MD and would continue arm elevation given the above.  She does have easily palpable  left radial pulse   Doreatha Massed, [facility] [phone]  Clinic MD:   Chestine Spore

## 2022-11-29 NOTE — Progress Notes (Signed)
  Subjective:  Patient ID: Lauren [name], female    DOB: 1980/12/02,  MRN: [id number]  Chief Complaint  Patient presents with   Wound Check    "It's good, I think we might be close to wrapping this up."   07/21/2022, left foot fifth metatarsal resection  42 y.o. female returns for post-op check.  She is doing all her nails are also thickened elongated has not noted any drainage recently  Review of Systems: Negative except as noted in the HPI. Denies N/V/F/Ch.   Objective:  There were no vitals filed for this visit. There is no height or weight on file to calculate BMI. Constitutional Well developed. Well nourished.  Vascular Foot warm and well perfused. Capillary refill normal to all digits.  Calf is soft and supple, no posterior calf or knee pain, negative Homans' sign  Neurologic Normal speech. Oriented to person, place, and time. Epicritic sensation to light touch grossly present bilaterally.  Dermatologic Thickened elongated dystrophic mycotic toenails x 8.  Ulceration has healed on lateral foot  Orthopedic: No pain to palpation.  Foot is maintaining rectus positioning     Assessment:   1. 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 Pain due to onychomycosis of toenails of both feet    Plan:  Patient was evaluated and treated and all questions answered.  S/p foot surgery left -She is doing well and her ulceration has fully healed at this point she may leave open to air and apply Neosporin lotion or Vaseline to the wound site.  Her foot position appears to be maintaining in a rectus position as well.  If they feel like it is limiting her in ambulation weightbearing or it is progressing into a cavovarus position they will let me know if would like to proceed with an AFO but right now I think she may continue in regular shoe gear at this point.  Discussed the etiology and treatment options for the condition in detail with the patient.  Prior nail debridements have  been helpful.  Recommended debridement of the nails today. Sharp and mechanical debridement performed of all painful and mycotic nails today. Nails debrided in length and thickness using a nail nipper to level of comfort. Discussed treatment options including appropriate shoe gear. Follow up as needed for painful nails.    Return for at risk diabetic foot care.

## 2022-11-30 ENCOUNTER — Ambulatory Visit (INDEPENDENT_AMBULATORY_CARE_PROVIDER_SITE_OTHER)
Admission: RE | Admit: 2022-11-30 | Discharge: 2022-11-30 | Disposition: A | Discharge status: Home / Self Care | Payer: Commercial Managed Care - PPO | Source: Ambulatory Visit | Attending: Vascular Surgery | Admitting: Vascular Surgery

## 2022-11-30 ENCOUNTER — Ambulatory Visit (INDEPENDENT_AMBULATORY_CARE_PROVIDER_SITE_OTHER): Payer: Commercial Managed Care - PPO | Admitting: Physician Assistant

## 2022-11-30 ENCOUNTER — Ambulatory Visit (HOSPITAL_COMMUNITY)
Admission: RE | Admit: 2022-11-30 | Discharge: 2022-11-30 | Disposition: A | Discharge status: Home / Self Care | Payer: Commercial Managed Care - PPO | Source: Ambulatory Visit | Attending: Vascular Surgery | Admitting: Vascular Surgery

## 2022-11-30 VITALS — BP 153/96 | HR 86 | Temp 97.9°F | Resp 18 | Ht 65.0 in | Wt 225.9 lb

## 2022-11-30 DIAGNOSIS — I70222 Atherosclerosis of native arteries of extremities with rest pain, left leg: Secondary | ICD-10-CM

## 2022-11-30 DIAGNOSIS — I70244 Atherosclerosis of native arteries of left leg with ulceration of heel and midfoot: Secondary | ICD-10-CM | POA: Insufficient documentation

## 2022-11-30 LAB — VAS US ABI WITH/WO TBI

## 2022-12-01 ENCOUNTER — Ambulatory Visit (INDEPENDENT_AMBULATORY_CARE_PROVIDER_SITE_OTHER): Payer: Commercial Managed Care - PPO | Admitting: *Deleted

## 2022-12-01 ENCOUNTER — Other Ambulatory Visit: Payer: Self-pay

## 2022-12-01 DIAGNOSIS — Z5181 Encounter for therapeutic drug level monitoring: Secondary | ICD-10-CM | POA: Diagnosis not present

## 2022-12-01 DIAGNOSIS — I639 Cerebral infarction, unspecified: Secondary | ICD-10-CM

## 2022-12-01 DIAGNOSIS — I70222 Atherosclerosis of native arteries of extremities with rest pain, left leg: Secondary | ICD-10-CM

## 2022-12-01 DIAGNOSIS — I70244 Atherosclerosis of native arteries of left leg with ulceration of heel and midfoot: Secondary | ICD-10-CM

## 2022-12-01 LAB — POCT INR: INR: 2 (ref 2.0–3.0)

## 2022-12-01 NOTE — Patient Instructions (Signed)
Increase warfarin to 1 tablet daily except 1 1/2 tablets on Wednesdays Recheck in 3 wks

## 2022-12-02 ENCOUNTER — Encounter: Payer: Self-pay | Admitting: Internal Medicine

## 2022-12-09 ENCOUNTER — Ambulatory Visit: Payer: BC Managed Care – PPO | Admitting: Student

## 2022-12-13 ENCOUNTER — Other Ambulatory Visit: Payer: Self-pay | Admitting: Internal Medicine

## 2022-12-13 ENCOUNTER — Encounter: Payer: Self-pay | Admitting: Physical Medicine & Rehabilitation

## 2022-12-13 ENCOUNTER — Encounter: Payer: Self-pay | Admitting: Internal Medicine

## 2022-12-13 ENCOUNTER — Other Ambulatory Visit: Payer: Self-pay

## 2022-12-13 DIAGNOSIS — I639 Cerebral infarction, unspecified: Secondary | ICD-10-CM

## 2022-12-13 MED ORDER — ISOSORBIDE MONONITRATE ER 60 MG PO TB24: 120.0000 mg | ORAL_TABLET | Freq: Every day | ORAL | 1 refills | Status: DC

## 2022-12-13 NOTE — Progress Notes (Deleted)
Cardiology Office Note:  .   Date:  12/13/2022  ID:  Toy Baker, DOB [birth year], MRN [id number] PCP: Billie Lade, MD  [facility] Providers Cardiologist:  Dina Rich, MD { Click to update primary MD,subspecialty MD or APP then REFRESH:1}   History of Present Illness: .   Midajah Doudna is a 42 y.o. female with history of CAD, chronic systolic CHF/ICM, HTN, HLD, CVA.  Patient last saw Dr. Wyline Mood 09/08/22 who recommended repeat echo at f/u and if EF still down may need to consider ICD. Hydralazine increased for elevated BP.   ROS: ***  Studies Reviewed: Marland Kitchen         Prior CV Studies: LEFT HEART CATH AND CORONARY ANGIOGRAPHY 07/26/2022  Narrative   Ost LAD to Prox LAD lesion is 75% stenosed.   Prox LAD to Mid LAD lesion is 100% stenosed.   Prox Cx to Dist Cx lesion is 35% stenosed with 35% stenosed side branch in LPAV.   Prox RCA lesion is 95% stenosed.   Mid RCA lesion is 95% stenosed.   Dist RCA lesion is 100% stenosed.   LV end diastolic pressure is mildly elevated.  2 vessel occlusive CAD. The LAD is occluded after a small diagonal. The RCA is small and diffusely diseased. It is occluded distally Mildly elevated LVEDP  Plan: medical management. No suitable targets for revascularization.   ECHO COMPLETE WITH IMAGING ENHANCING AGENT 07/24/2022  Narrative ECHOCARDIOGRAM REPORT    Patient Name:   SHERRAE LEHRER Date of Exam: 07/24/2022 Medical Rec #:  [id number]       Height:       65.0 in Accession #:    [id number]      Weight:       195.0 lb Date of Birth:  [birth year]       BSA:          1.957 m Patient Age:    42 years        BP:           144/79 mmHg Patient Gender: F               HR:           97 bpm. Exam Location:  Inpatient  Procedure: 2D Echo, Cardiac Doppler, Color Doppler, 3D Echo and Intracardiac Opacification Agent  Indications:    Chest pain R07.9  History:        Patient has prior history of Echocardiogram examinations,  most recent 01/14/2022. Carotid Disease, PAD, CKD4 and Stroke; Risk Factors:Hypertension, Dyslipidemia and Former Smoker.  Sonographer:    Dondra Prader RVT RCS Referring Phys: [id number] [facility]   Sonographer Comments: Technically challenging study due to limited acoustic windows, Technically difficult study due to poor echo windows, suboptimal apical window and suboptimal parasternal window. IMPRESSIONS   1. Left ventricular ejection fraction, by estimation, is 30 to 35%. The left ventricle has moderately decreased function. The left ventricle demonstrates regional wall motion abnormalities (see scoring diagram/findings for description). There is moderate left ventricular hypertrophy. Left ventricular diastolic parameters are indeterminate. 2. Right ventricular systolic function is mildly reduced. The right ventricular size is normal. There is mildly elevated pulmonary artery systolic pressure. The estimated right ventricular systolic pressure is 37.1 mmHg. 3. Left atrial size was moderately dilated. 4. The mitral valve is grossly normal. Mild to moderate mitral valve regurgitation. 5. The aortic valve was not well visualized. Aortic valve regurgitation is not visualized. Aortic valve mean gradient measures 2.0 mmHg.  6. The inferior vena cava is normal in size with greater than 50% respiratory variability, suggesting right atrial pressure of 3 mmHg.  Comparison(s): Prior images reviewed side by side. LVEF has decreased in comparison, 30-35% range with wall motion abnormalities. Mitral regurgitation mild to moderate.  FINDINGS Left Ventricle: Left ventricular ejection fraction, by estimation, is 30 to 35%. The left ventricle has moderately decreased function. The left ventricle demonstrates regional wall motion abnormalities. Definity contrast agent was given IV to delineate the left ventricular endocardial borders. The left ventricular internal cavity size was normal in size. There is  moderate left ventricular hypertrophy. Left ventricular diastolic parameters are indeterminate.   LV Wall Scoring: The mid and distal anterior septum, apical lateral segment, apical inferior segment, and apex are akinetic. The inferior wall, basal anteroseptal segment, mid inferolateral segment, mid inferoseptal segment, apical anterior segment, and basal inferoseptal segment are hypokinetic. The anterior wall, antero-lateral wall, and basal inferolateral segment are normal.  Right Ventricle: The right ventricular size is normal. No increase in right ventricular wall thickness. Right ventricular systolic function is mildly reduced. There is mildly elevated pulmonary artery systolic pressure. The tricuspid regurgitant velocity is 2.92 m/s, and with an assumed right atrial pressure of 3 mmHg, the estimated right ventricular systolic pressure is 37.1 mmHg.  Left Atrium: Left atrial size was moderately dilated.  Right Atrium: Right atrial size was normal in size.  Pericardium: There is no evidence of pericardial effusion.  Mitral Valve: The mitral valve is grossly normal. Mild to moderate mitral valve regurgitation.  Tricuspid Valve: The tricuspid valve is grossly normal. Tricuspid valve regurgitation is trivial.  Aortic Valve: The aortic valve was not well visualized. Aortic valve regurgitation is not visualized. Aortic valve mean gradient measures 2.0 mmHg. Aortic valve peak gradient measures 2.9 mmHg. Aortic valve area, by VTI measures 2.27 cm.  Pulmonic Valve: The pulmonic valve was not well visualized. Pulmonic valve regurgitation is trivial.  Aorta: The aortic root is normal in size and structure.  Venous: The inferior vena cava is normal in size with greater than 50% respiratory variability, suggesting right atrial pressure of 3 mmHg.  IAS/Shunts: No atrial level shunt detected by color flow Doppler.   LEFT VENTRICLE PLAX 2D LVIDd:         5.20 cm      Diastology LVIDs:          4.20 cm      LV e' medial:   5.63 cm/s LV PW:         1.80 cm      LV E/e' medial: 17.7 LV IVS:        1.10 cm LVOT diam:     1.70 cm LV SV:         35 LV SV Index:   18 LVOT Area:     2.27 cm  LV Volumes (MOD) LV vol d, MOD A2C: 202.0 ml LV vol s, MOD A2C: 133.0 ml LV SV MOD A2C:     69.0 ml  RIGHT VENTRICLE             IVC RV Basal diam:  3.60 cm     IVC diam: 2.10 cm RV S prime:     10.50 cm/s TAPSE (M-mode): 1.5 cm  LEFT ATRIUM             Index        RIGHT ATRIUM           Index LA diam:  4.10 cm 2.10 cm/m   RA Area:     13.00 cm LA Vol (A2C):   65.3 ml 33.37 ml/m  RA Volume:   27.30 ml  13.95 ml/m LA Vol (A4C):   88.8 ml 45.38 ml/m LA Biplane Vol: 76.4 ml 39.04 ml/m AORTIC VALVE                    PULMONIC VALVE AV Area (Vmax):    2.49 cm     PV Vmax:       0.71 m/s AV Area (Vmean):   2.43 cm     PV Peak grad:  2.0 mmHg AV Area (VTI):     2.27 cm AV Vmax:           84.70 cm/s AV Vmean:          56.300 cm/s AV VTI:            0.156 m AV Peak Grad:      2.9 mmHg AV Mean Grad:      2.0 mmHg LVOT Vmax:         93.00 cm/s LVOT Vmean:        60.300 cm/s LVOT VTI:          0.156 m LVOT/AV VTI ratio: 1.00  AORTA Ao Root diam: 2.80 cm Ao Asc diam:  3.10 cm Ao Arch diam: 2.9 cm  MITRAL VALVE               TRICUSPID VALVE MV Area (PHT): 6.22 cm    TR Peak grad:   34.1 mmHg MV Decel Time: 122 msec    TR Vmax:        292.00 cm/s MV E velocity: 99.70 cm/s MV A velocity: 47.50 cm/s  SHUNTS MV E/A ratio:  2.10        Systemic VTI:  0.16 m Systemic Diam: 1.70 cm  Nona Dell MD Electronically signed by Nona Dell MD Signature Date/Time: 07/24/2022/5:07:35 PM    Final    ***  Risk Assessment/Calculations:   {Does this patient have ATRIAL FIBRILLATION?[phone]} No BP recorded.  {Refresh Note OR Click here to enter BP  :1}***       Physical Exam:   VS:  There were no vitals taken for this visit.   Wt Readings from Last 3 Encounters:   11/30/22 225 lb 14.4 oz (102.5 kg)  11/22/22 221 lb (100.2 kg)  11/17/22 229 lb 1.9 oz (103.9 kg)    GEN: Well nourished, well developed in no acute distress NECK: No JVD; No carotid bruits CARDIAC: ***RRR, no murmurs, rubs, gallops RESPIRATORY:  Clear to auscultation without rales, wheezing or rhonchi  ABDOMEN: Soft, non-tender, non-distended EXTREMITIES:  No edema; No deformity   ASSESSMENT AND PLAN: .    Chronic systolic CHF  - 07/2022 echo: LVEF 30-35% -avoiding ACe/ARB/ARNI/aldactone/SGLT2i given poor renal function.  CAD -NSTEMI 07/2022 cath: ostial LAD 75%, prox to mid occluded LAD CTO. LCX 35%, RCA prox to mid 95%, distal occluded. 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  - -Etiology unclear likely large vessel disease, cannot rule out cardioembolic source  -Hypercoagulable work-up showed abnormal lupus anticoagulant but was also on heparin, slightly increased rheumatoid factor  - now on coumadin and plavix  Upper extremity ischemia - admit 10/2021 with right brachial/radial/ulnar ischemic requriing thrombectomy  CKD stage IV  DM2    {Are you ordering a CV Procedure (e.g. stress test, cath, DCCV, TEE, etc)?   Press F2        :[id number]}  Dispo: ***  Signed, Jacolyn Reedy, PA-C

## 2022-12-14 ENCOUNTER — Ambulatory Visit: Payer: Self-pay | Admitting: Physician Assistant

## 2022-12-14 MED ORDER — OXYCODONE-ACETAMINOPHEN 7.5-325 MG PO TABS: 1.0000 | ORAL_TABLET | Freq: Every day | ORAL | 0 refills | Status: DC | PRN

## 2022-12-17 ENCOUNTER — Encounter: Payer: Self-pay | Admitting: Cardiology

## 2022-12-22 ENCOUNTER — Ambulatory Visit: Payer: Self-pay | Attending: Cardiology

## 2022-12-25 ENCOUNTER — Other Ambulatory Visit: Payer: Self-pay | Admitting: Internal Medicine

## 2022-12-25 DIAGNOSIS — I639 Cerebral infarction, unspecified: Secondary | ICD-10-CM

## 2023-01-04 ENCOUNTER — Other Ambulatory Visit (HOSPITAL_BASED_OUTPATIENT_CLINIC_OR_DEPARTMENT_OTHER): Payer: Self-pay

## 2023-01-04 ENCOUNTER — Other Ambulatory Visit (HOSPITAL_COMMUNITY): Payer: Self-pay

## 2023-01-05 ENCOUNTER — Other Ambulatory Visit (HOSPITAL_COMMUNITY): Payer: Self-pay

## 2023-01-05 ENCOUNTER — Other Ambulatory Visit: Payer: Self-pay | Admitting: Nurse Practitioner

## 2023-01-10 ENCOUNTER — Ambulatory Visit: Payer: BC Managed Care – PPO | Attending: Cardiology | Admitting: *Deleted

## 2023-01-10 DIAGNOSIS — I639 Cerebral infarction, unspecified: Secondary | ICD-10-CM | POA: Insufficient documentation

## 2023-01-10 DIAGNOSIS — Z5181 Encounter for therapeutic drug level monitoring: Secondary | ICD-10-CM | POA: Insufficient documentation

## 2023-01-10 LAB — POCT INR: INR: 3.7 — AB (ref 2.0–3.0)

## 2023-01-10 NOTE — Patient Instructions (Signed)
Hold warfarin tonight then decrease dose to 1 tablet daily Recheck in 3 wks

## 2023-01-13 ENCOUNTER — Other Ambulatory Visit (HOSPITAL_COMMUNITY): Payer: Self-pay

## 2023-01-14 ENCOUNTER — Encounter: Payer: Self-pay | Admitting: Physical Medicine & Rehabilitation

## 2023-01-14 ENCOUNTER — Other Ambulatory Visit (HOSPITAL_COMMUNITY): Payer: Self-pay

## 2023-01-14 MED FILL — Continuous Glucose System Sensor: 30 days supply | Qty: 3 | Fill #0 | Status: CN

## 2023-01-15 MED ORDER — OXYCODONE-ACETAMINOPHEN 7.5-325 MG PO TABS: 1.0000 | ORAL_TABLET | Freq: Every day | ORAL | 0 refills | Status: DC | PRN

## 2023-01-17 ENCOUNTER — Telehealth: Payer: Self-pay

## 2023-01-17 NOTE — Telephone Encounter (Signed)
PA submitted for Oxycodone/APAP.

## 2023-01-18 ENCOUNTER — Telehealth: Payer: Self-pay | Admitting: *Deleted

## 2023-01-18 ENCOUNTER — Telehealth: Payer: Self-pay

## 2023-01-18 ENCOUNTER — Other Ambulatory Visit: Payer: Self-pay

## 2023-01-18 ENCOUNTER — Other Ambulatory Visit (HOSPITAL_COMMUNITY): Payer: Self-pay

## 2023-01-18 MED FILL — Continuous Glucose System Sensor: 30 days supply | Qty: 3 | Fill #0 | Status: CN

## 2023-01-18 NOTE — Telephone Encounter (Signed)
Patient left a voicemail that her pharmacy had contacted her , they shared with her that her Dexcom G6 sensor would need a PA . We have not seen this come through the office yet. Will follow up with the PA team to see if they have seen it and get a status.

## 2023-01-18 NOTE — Telephone Encounter (Signed)
Oxycodone/APAP approved 01/18/23-07/18/23

## 2023-01-18 NOTE — Telephone Encounter (Signed)
Pharmacy Patient Advocate Encounter   Received notification from Pt Calls Messages that prior authorization for Dexcom G6 sensor is required/requested.   Insurance verification completed.   The patient is insured through [facility] .   Per test claim: PA required; PA submitted to [employer] [facility] via CoverMyMeds Key/confirmation #/EOC [facility] Status is pending

## 2023-01-19 ENCOUNTER — Other Ambulatory Visit (HOSPITAL_COMMUNITY): Payer: Self-pay

## 2023-01-19 ENCOUNTER — Other Ambulatory Visit: Payer: Self-pay

## 2023-01-19 MED FILL — Continuous Glucose System Sensor: 30 days supply | Qty: 3 | Fill #0 | Status: AC

## 2023-01-20 ENCOUNTER — Encounter: Payer: Self-pay | Admitting: Internal Medicine

## 2023-01-20 ENCOUNTER — Other Ambulatory Visit (HOSPITAL_COMMUNITY): Payer: Self-pay

## 2023-01-20 ENCOUNTER — Other Ambulatory Visit: Payer: Self-pay

## 2023-01-20 ENCOUNTER — Other Ambulatory Visit: Payer: Self-pay | Admitting: Nurse Practitioner

## 2023-01-20 DIAGNOSIS — I639 Cerebral infarction, unspecified: Secondary | ICD-10-CM

## 2023-01-20 MED ORDER — DEXCOM G6 TRANSMITTER MISC
3 refills | Status: DC
  Filled 2023-01-20: qty 1, 90d supply, fill #0
  Filled 2023-05-30 – 2023-06-01 (×2): qty 1, 90d supply, fill #1

## 2023-01-20 MED ORDER — RANOLAZINE ER 1000 MG PO TB12: 1000.0000 mg | ORAL_TABLET | Freq: Two times a day (BID) | ORAL | 1 refills | Status: DC

## 2023-01-20 MED ORDER — TIZANIDINE HCL 4 MG PO TABS
4.0000 mg | ORAL_TABLET | Freq: Every day | ORAL | 1 refills | Status: DC
Start: 2023-01-20 — End: 2023-05-16

## 2023-01-24 ENCOUNTER — Encounter: Payer: BC Managed Care – PPO | Admitting: Physical Medicine & Rehabilitation

## 2023-01-24 NOTE — Telephone Encounter (Signed)
Pharmacy Patient Advocate Encounter  Received notification from [facility] that Prior Authorization for Dexcom G6 sensor has been APPROVED through 01/17/2024   PA #/Case ID/Reference #: [id number]

## 2023-01-24 NOTE — Telephone Encounter (Signed)
Patient was called and made aware.

## 2023-01-25 ENCOUNTER — Other Ambulatory Visit: Payer: Self-pay | Admitting: Internal Medicine

## 2023-01-25 ENCOUNTER — Other Ambulatory Visit: Payer: Self-pay

## 2023-01-25 ENCOUNTER — Encounter: Payer: Self-pay | Admitting: Internal Medicine

## 2023-01-25 DIAGNOSIS — I639 Cerebral infarction, unspecified: Secondary | ICD-10-CM

## 2023-01-25 DIAGNOSIS — R110 Nausea: Secondary | ICD-10-CM

## 2023-01-25 DIAGNOSIS — F5101 Primary insomnia: Secondary | ICD-10-CM

## 2023-01-25 MED ORDER — SCOPOLAMINE 1 MG/3DAYS TD PT72
1.0000 | MEDICATED_PATCH | TRANSDERMAL | 2 refills | Status: DC
Start: 2023-01-25 — End: 2023-02-04

## 2023-01-25 MED ORDER — TRAZODONE HCL 100 MG PO TABS: 100.0000 mg | ORAL_TABLET | Freq: Every day | ORAL | 0 refills | Status: DC

## 2023-02-01 ENCOUNTER — Encounter: Payer: Self-pay | Admitting: Podiatry

## 2023-02-01 ENCOUNTER — Ambulatory Visit: Payer: BC Managed Care – PPO | Attending: Cardiology | Admitting: *Deleted

## 2023-02-01 ENCOUNTER — Ambulatory Visit (INDEPENDENT_AMBULATORY_CARE_PROVIDER_SITE_OTHER): Payer: BC Managed Care – PPO | Admitting: Podiatry

## 2023-02-01 DIAGNOSIS — M79675 Pain in left toe(s): Secondary | ICD-10-CM

## 2023-02-01 DIAGNOSIS — M79674 Pain in right toe(s): Secondary | ICD-10-CM

## 2023-02-01 DIAGNOSIS — B351 Tinea unguium: Secondary | ICD-10-CM | POA: Diagnosis not present

## 2023-02-01 DIAGNOSIS — I639 Cerebral infarction, unspecified: Secondary | ICD-10-CM

## 2023-02-01 DIAGNOSIS — Z5181 Encounter for therapeutic drug level monitoring: Secondary | ICD-10-CM | POA: Diagnosis not present

## 2023-02-01 LAB — POCT INR: INR: 4.1 — AB (ref 2.0–3.0)

## 2023-02-01 NOTE — Progress Notes (Signed)
Subjective:  Patient ID: Lauren [name], female    DOB: 09/07/1980,  MRN: [id number]  Chief Complaint  Patient presents with   Routine Foot Care    Recently had superficial scab to right 2nd toe, healing well   07/21/2022, left foot fifth metatarsal resection  42 y.o. female returns for post-op check.  Scab seems to be healing now, no other new issues, nails are thickened elongated causing pain and discomfort  Review of Systems: Negative except as noted in the HPI. Denies N/V/F/Ch.   Objective:  There were no vitals filed for this visit. There is no height or weight on file to calculate BMI. Constitutional Well developed. Well nourished.  Vascular Foot warm and well perfused. Capillary refill normal to all digits.  Calf is soft and supple, no posterior calf or knee pain, negative Homans' sign  Neurologic Normal speech. Oriented to person, place, and time. Epicritic sensation to light touch grossly present bilaterally.  Dermatologic Thickened elongated dystrophic mycotic toenails x 8.  Ulceration remains healed on lateral foot  Orthopedic: No pain to palpation.  Foot is maintaining rectus positioning     Assessment:   1. Pain due to onychomycosis of toenails of both feet    Plan:  Patient was evaluated and treated and all questions answered.  S/p foot surgery left -Foot remains in a rectus position.  Noted ulcerations there is a superficial scab on the right second toe.  They will let me know if there is any signs of infection or nonhealing.  So far we will hold off on AFO as foot is maintaining good positioning and she is ambulating fairly well.  Discussed the etiology and treatment options for the condition in detail with the patient.  Prior nail debridements have been helpful.  Recommended debridement of the nails today. Sharp and mechanical debridement performed of all painful and mycotic nails today. Nails debrided in length and thickness using a nail nipper to level of  comfort. Discussed treatment options including appropriate shoe gear. Follow up as needed for painful nails.    Return in about 11 weeks (around 04/19/2023) for at risk diabetic foot care.

## 2023-02-01 NOTE — Patient Instructions (Signed)
Hold warfarin tonight then decrease dose to 1 tablet daily except 1/2 tablet on Wednesdays and Saturdays Recheck in 2 wks

## 2023-02-04 ENCOUNTER — Ambulatory Visit: Payer: BC Managed Care – PPO | Admitting: Internal Medicine

## 2023-02-04 ENCOUNTER — Encounter: Payer: Self-pay | Admitting: Internal Medicine

## 2023-02-04 VITALS — BP 132/84 | HR 89 | Resp 16 | Ht 65.0 in | Wt 224.0 lb

## 2023-02-04 DIAGNOSIS — R110 Nausea: Secondary | ICD-10-CM | POA: Diagnosis not present

## 2023-02-04 DIAGNOSIS — F5104 Psychophysiologic insomnia: Secondary | ICD-10-CM

## 2023-02-04 MED ORDER — TRAZODONE HCL 150 MG PO TABS: 150.0000 mg | ORAL_TABLET | Freq: Every day | ORAL | 2 refills | Status: DC

## 2023-02-04 MED ORDER — METOCLOPRAMIDE HCL 10 MG PO TABS
10.0000 mg | ORAL_TABLET | Freq: Four times a day (QID) | ORAL | 2 refills | Status: DC
Start: 2023-02-04 — End: 2023-02-18

## 2023-02-04 NOTE — Progress Notes (Signed)
Acute Office Visit  Subjective:     Patient ID: Lauren [name], female    DOB: January 12, 1981, 42 y.o.   MRN: [id number]  Chief Complaint  Patient presents with   Rash    Has been having an itchy rash where the patches have been    Lauren [name] presents today for an acute visit for evaluation of a rash that has developed behind her right ear after applying scopolamine patches.  She is prescribed scopolamine patches for relief of chronic nausea.  She notes that itching, blistering rashes have developed around the patches and on right side of her scalp after applying patches.  The rash resolves after patches are removed.  She has stopped using scopolamine because of these rashes and is interested in additional treatment options for relief of chronic nausea.  Her additional concern today is poorly controlled insomnia.  She is currently prescribed trazodone 100 mg nightly and is interested in potential medication adjustments.  Review of Systems  Skin:  Positive for rash (Blistering rash around the site of scopolamine patches).  Psychiatric/Behavioral:  The patient has insomnia.       Objective:    BP 132/84   Pulse 89   Resp 16   Ht 5\' 5"  (1.651 m)   Wt 224 lb (101.6 kg)   SpO2 97%   BMI 37.28 kg/m   Physical Exam Vitals reviewed.  Constitutional:      Comments: Examined in wheelchair  Skin:    General: Skin is warm and dry.     Comments: No rash present on exam today.  Scopolamine patch is in place behind the right ear.       Assessment & Plan:   Problem List Items Addressed This Visit       Insomnia    Her additional concern today is poorly controlled symptoms of insomnia.  She is currently prescribed trazodone 100 mg nightly. -Increase trazodone to 150 mg nightly      Chronic nausea - Primary    Presenting today for an acute visit for evaluation of a blistering rash that developed around the site of scopolamine patches that she uses for chronic nausea relief.  A patch  is in place today, but no surrounding rash is present.  Her description of rash seems most consistent with contact dermatitis.  Interestingly, she reports that patches do not adhere very well and she has also been applying tape over the patches.  This may be the cause of the rash.  She is not interested in continuing to use scopolamine patches. -Through shared decision making, scopolamine patches have been discontinued.  Metoclopramide 10 mg 4 times daily as needed for nausea relief have been prescribed today.  She will return to care for previously scheduled follow-up on 10/18 for reassessment.      Meds ordered this encounter  Medications   traZODone (DESYREL) 150 MG tablet    Sig: Take 1 tablet (150 mg total) by mouth at bedtime.    Dispense:  90 tablet    Refill:  2   metoCLOPramide (REGLAN) 10 MG tablet    Sig: Take 1 tablet (10 mg total) by mouth 4 (four) times daily.    Dispense:  60 tablet    Refill:  2    Return if symptoms worsen or fail to improve.  Billie Lade, MD

## 2023-02-04 NOTE — Patient Instructions (Signed)
It was a pleasure to see you today.  Thank you for giving [name] the opportunity to be involved in your care.  Below is a brief recap of your visit and next steps.  We will plan to see you again on 10/18.  Summary Discontinue scopolamine patches and start metoclopramide 10 mg every 4-6 hours Increase trazodone to 150 mg nightly Follow up as scheduled on 10/18

## 2023-02-04 NOTE — Assessment & Plan Note (Signed)
Presenting today for an acute visit for evaluation of a blistering rash that developed around the site of scopolamine patches that she uses for chronic nausea relief.  A patch is in place today, but no surrounding rash is present.  Her description of rash seems most consistent with contact dermatitis.  Interestingly, she reports that patches do not adhere very well and she has also been applying tape over the patches.  This may be the cause of the rash.  She is not interested in continuing to use scopolamine patches. -Through shared decision making, scopolamine patches have been discontinued.  Metoclopramide 10 mg 4 times daily as needed for nausea relief have been prescribed today.  She will return to care for previously scheduled follow-up on 10/18 for reassessment.

## 2023-02-04 NOTE — Assessment & Plan Note (Signed)
Her additional concern today is poorly controlled symptoms of insomnia.  She is currently prescribed trazodone 100 mg nightly. -Increase trazodone to 150 mg nightly

## 2023-02-08 MED ORDER — OXYCODONE-ACETAMINOPHEN 7.5-325 MG PO TABS: 1.0000 | ORAL_TABLET | Freq: Every day | ORAL | 0 refills | Status: DC | PRN

## 2023-02-08 NOTE — Addendum Note (Signed)
Addended by: Fanny Dance on: 02/08/2023 11:39 PM   Modules accepted: Orders

## 2023-02-11 ENCOUNTER — Other Ambulatory Visit (HOSPITAL_COMMUNITY): Payer: Self-pay

## 2023-02-14 ENCOUNTER — Other Ambulatory Visit: Payer: Self-pay | Admitting: Cardiology

## 2023-02-14 NOTE — Progress Notes (Deleted)
Cardiology Office Note:  .   Date:  02/14/2023  ID:  Toy Baker, DOB Jul 19, 1980, MRN [id number] PCP: Billie Lade, MD  [facility] Providers Cardiologist:  Dina Rich, MD { Click to update primary MD,subspecialty MD or APP then REFRESH:1}   History of Present Illness: .   Lauren [name] is a 42 y.o. 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 Wyline Mood 09/08/22 who recommended repeat echo at f/u and if EF still down may need to consider ICD. Hydralazine increased for elevated BP.   Seen in the hospital 11/10/22 with chest pain. Imdur increased 120 mg daily. Medical management.   ROS: ***  Studies Reviewed: Marland Kitchen         Prior CV Studies: LEFT HEART CATH AND CORONARY ANGIOGRAPHY 07/26/2022  Narrative   Ost LAD to Prox LAD lesion is 75% stenosed.   Prox LAD to Mid LAD lesion is 100% stenosed.   Prox Cx to Dist Cx lesion is 35% stenosed with 35% stenosed side branch in LPAV.   Prox RCA lesion is 95% stenosed.   Mid RCA lesion is 95% stenosed.   Dist RCA lesion is 100% stenosed.   LV end diastolic pressure is mildly elevated.  2 vessel occlusive CAD. The LAD is occluded after a small diagonal. The RCA is small and diffusely diseased. It is occluded distally Mildly elevated LVEDP  Plan: medical management. No suitable targets for revascularization.   ECHO COMPLETE WITH IMAGING  ENHANCING AGENT 07/24/2022  Narrative ECHOCARDIOGRAM REPORT    Patient Name:   Lauren [name] Date of Exam: 07/24/2022 Medical Rec #:  [id number]       Height:       65.0 in Accession #:    [id number]      Weight:       195.0 lb Date of Birth:  1980-07-03       BSA:          1.957 m Patient Age:    42 years        BP:           144/79 mmHg Patient Gender: F               HR:           97 bpm. Exam Location:  Inpatient  Procedure: 2D Echo, Cardiac Doppler, Color Doppler, 3D Echo and Intracardiac Opacification Agent  Indications:    Chest pain R07.9  History:        Patient has prior history of Echocardiogram examinations, most recent 01/14/2022. Carotid Disease, PAD, CKD4 and Stroke; Risk Factors:Hypertension, Dyslipidemia and Former Smoker.  Sonographer:    Dondra Prader RVT RCS Referring Phys: [id number] [facility]   Sonographer Comments: Technically challenging study due to limited acoustic windows, Technically difficult study due to poor echo windows, suboptimal apical window and suboptimal parasternal window. IMPRESSIONS   1. Left ventricular ejection fraction, by estimation, is 30 to 35%. The left ventricle has moderately decreased function. The left  ventricle demonstrates regional wall motion abnormalities (see scoring diagram/findings for description). There is moderate left ventricular hypertrophy. Left ventricular diastolic parameters are indeterminate. 2. Right ventricular systolic function is mildly reduced. The right ventricular size is normal. There is mildly elevated pulmonary artery systolic pressure. The estimated right ventricular systolic pressure is 37.1 mmHg. 3. Left atrial size was moderately dilated. 4. The mitral valve is grossly normal. Mild to moderate mitral valve regurgitation. 5. The aortic valve was not well visualized. Aortic valve regurgitation is not visualized. Aortic valve mean gradient measures 2.0 mmHg. 6. The inferior vena cava is normal in size  with greater than 50% respiratory variability, suggesting right atrial pressure of 3 mmHg.  Comparison(s): Prior images reviewed side by side. LVEF has decreased in comparison, 30-35% range with wall motion abnormalities. Mitral regurgitation mild to moderate.  FINDINGS Left Ventricle: Left ventricular ejection fraction, by estimation, is 30 to 35%. The left ventricle has moderately decreased function. The left ventricle demonstrates regional wall motion abnormalities. Definity contrast agent was given IV to delineate the left ventricular endocardial borders. The left ventricular internal cavity size was normal in size. There is moderate left ventricular hypertrophy. Left ventricular diastolic parameters are indeterminate.   LV Wall Scoring: The mid and distal anterior septum, apical lateral segment, apical inferior segment, and apex are akinetic. The inferior wall, basal anteroseptal segment, mid inferolateral segment, mid inferoseptal segment, apical anterior segment, and basal inferoseptal segment are hypokinetic. The anterior wall, antero-lateral wall, and basal inferolateral segment are normal.  Right Ventricle: The right ventricular size is normal. No increase in right ventricular wall thickness. Right ventricular systolic function is mildly reduced. There is mildly elevated pulmonary artery systolic pressure. The tricuspid regurgitant velocity is 2.92 m/s, and with an assumed right atrial pressure of 3 mmHg, the estimated right ventricular systolic pressure is 37.1 mmHg.  Left Atrium: Left atrial size was moderately dilated.  Right Atrium: Right atrial size was normal in size.  Pericardium: There is no evidence of pericardial effusion.  Mitral Valve: The mitral valve is grossly normal. Mild to moderate mitral valve regurgitation.  Tricuspid Valve: The tricuspid valve is grossly normal. Tricuspid valve regurgitation is trivial.  Aortic Valve: The aortic valve was not well  visualized. Aortic valve regurgitation is not visualized. Aortic valve mean gradient measures 2.0 mmHg. Aortic valve peak gradient measures 2.9 mmHg. Aortic valve area, by VTI measures 2.27 cm.  Pulmonic Valve: The pulmonic valve was not well visualized. Pulmonic valve regurgitation is trivial.  Aorta: The aortic root is normal in size and structure.  Venous: The inferior vena cava is normal in size with greater than 50% respiratory variability, suggesting right atrial pressure of 3 mmHg.  IAS/Shunts: No atrial level shunt detected by color flow Doppler.   LEFT VENTRICLE PLAX 2D LVIDd:         5.20 cm      Diastology LVIDs:         4.20 cm      LV e' medial:   5.63 cm/s LV PW:         1.80 cm      LV E/e' medial: 17.7 LV IVS:        1.10 cm LVOT diam:     1.70 cm LV SV:         35 LV SV Index:   18 LVOT Area:     2.27 cm  LV Volumes (MOD) LV vol d, MOD A2C: 202.0 ml LV vol s, MOD A2C: 133.0  ml LV SV MOD A2C:     69.0 ml  RIGHT VENTRICLE             IVC RV Basal diam:  3.60 cm     IVC diam: 2.10 cm RV S prime:     10.50 cm/s TAPSE (M-mode): 1.5 cm  LEFT ATRIUM             Index        RIGHT ATRIUM           Index LA diam:        4.10 cm 2.10 cm/m   RA Area:     13.00 cm LA Vol (A2C):   65.3 ml 33.37 ml/m  RA Volume:   27.30 ml  13.95 ml/m LA Vol (A4C):   88.8 ml 45.38 ml/m LA Biplane Vol: 76.4 ml 39.04 ml/m AORTIC VALVE                    PULMONIC VALVE AV Area (Vmax):    2.49 cm     PV Vmax:       0.71 m/s AV Area (Vmean):   2.43 cm     PV Peak grad:  2.0 mmHg AV Area (VTI):     2.27 cm AV Vmax:           84.70 cm/s AV Vmean:          56.300 cm/s AV VTI:            0.156 m AV Peak Grad:      2.9 mmHg AV Mean Grad:      2.0 mmHg LVOT Vmax:         93.00 cm/s LVOT Vmean:        60.300 cm/s LVOT VTI:          0.156 m LVOT/AV VTI ratio: 1.00  AORTA Ao Root diam: 2.80 cm Ao Asc diam:  3.10 cm Ao Arch diam: 2.9 cm  MITRAL VALVE               TRICUSPID  VALVE MV Area (PHT): 6.22 cm    TR Peak grad:   34.1 mmHg MV Decel Time: 122 msec    TR Vmax:        292.00 cm/s MV E velocity: 99.70 cm/s MV A velocity: 47.50 cm/s  SHUNTS MV E/A ratio:  2.10        Systemic VTI:  0.16 m Systemic Diam: 1.70 cm  Nona Dell MD Electronically signed by Nona Dell MD Signature Date/Time: 07/24/2022/5:07:35 PM    Final       Risk Assessment/Calculations:   {Does this patient have ATRIAL FIBRILLATION?[phone]} No BP recorded.  {Refresh Note OR Click here to enter BP  :1}***       Physical Exam:   VS:  There were no vitals taken for this visit.   Wt Readings from Last 3 Encounters:  02/04/23 224 lb (101.6 kg)  11/30/22 225 lb 14.4 oz (102.5 kg)  11/22/22 221 lb (100.2 kg)    GEN: Well nourished, well developed in no acute distress NECK: No JVD; No carotid bruits CARDIAC: ***RRR, no murmurs, rubs, gallops RESPIRATORY:  Clear to auscultation without rales, wheezing or rhonchi  ABDOMEN: Soft, non-tender, non-distended EXTREMITIES:  No edema; No deformity   ASSESSMENT AND PLAN: .    CAD cath: ostial LAD 75%, prox to mid occluded LAD CTO. LCX 35%, RCA prox to mid 95%, distal occluded. No targets for revasc -  07/2022 echo: LVEF 30-35% - repeat admit 08/2022 with chest pain, managed hep gtt and NG gtt  Chronic systolic HF/ICM EF 35-40% G3DD, apex AK -prior side effects on higerh beta blocker dosing. - avoid ACE/ARB/ARNI/aldactone/SGLT2i given renal function - euvolemic today, continue current meds - will need to recheck echo at f/u, now at 30-35% ICD may need to be considered. Due to multiple medical comorbidities unclear candidacy.        HTN - above goal, increase hydralazine to 25mg  bid   Hyperlipidemia - essentially at goal, continue current meds    {Are you ordering a CV Procedure (e.g. stress test, cath, DCCV, TEE, etc)?   Press F2        :[id number]}  Dispo: ***  Signed, Jacolyn Reedy, PA-C

## 2023-02-14 NOTE — Telephone Encounter (Signed)
Refill request for warfarin:  Last INR was 4.1 on 02/01/23 Next INR due 02/15/23 LOV was 09/08/22  Refill approved.

## 2023-02-15 ENCOUNTER — Ambulatory Visit: Payer: BC Managed Care – PPO | Attending: Cardiology

## 2023-02-15 ENCOUNTER — Other Ambulatory Visit (HOSPITAL_COMMUNITY): Payer: Self-pay

## 2023-02-15 MED FILL — Continuous Glucose System Sensor: 30 days supply | Qty: 3 | Fill #1 | Status: AC

## 2023-02-16 ENCOUNTER — Other Ambulatory Visit (HOSPITAL_COMMUNITY): Payer: Self-pay

## 2023-02-17 ENCOUNTER — Other Ambulatory Visit (HOSPITAL_COMMUNITY): Payer: Self-pay

## 2023-02-18 ENCOUNTER — Ambulatory Visit: Payer: BC Managed Care – PPO | Admitting: Internal Medicine

## 2023-02-18 ENCOUNTER — Encounter: Payer: Self-pay | Admitting: Internal Medicine

## 2023-02-18 VITALS — BP 142/85 | HR 84 | Temp 98.3°F | Ht 65.0 in | Wt 224.0 lb

## 2023-02-18 DIAGNOSIS — E1022 Type 1 diabetes mellitus with diabetic chronic kidney disease: Secondary | ICD-10-CM | POA: Diagnosis not present

## 2023-02-18 DIAGNOSIS — R110 Nausea: Secondary | ICD-10-CM

## 2023-02-18 DIAGNOSIS — Z23 Encounter for immunization: Secondary | ICD-10-CM

## 2023-02-18 DIAGNOSIS — G4700 Insomnia, unspecified: Secondary | ICD-10-CM

## 2023-02-18 DIAGNOSIS — I739 Peripheral vascular disease, unspecified: Secondary | ICD-10-CM

## 2023-02-18 DIAGNOSIS — I10 Essential (primary) hypertension: Secondary | ICD-10-CM | POA: Diagnosis not present

## 2023-02-18 DIAGNOSIS — E1059 Type 1 diabetes mellitus with other circulatory complications: Secondary | ICD-10-CM | POA: Diagnosis not present

## 2023-02-18 DIAGNOSIS — E559 Vitamin D deficiency, unspecified: Secondary | ICD-10-CM

## 2023-02-18 DIAGNOSIS — E039 Hypothyroidism, unspecified: Secondary | ICD-10-CM

## 2023-02-18 DIAGNOSIS — N184 Chronic kidney disease, stage 4 (severe): Secondary | ICD-10-CM

## 2023-02-18 DIAGNOSIS — E782 Mixed hyperlipidemia: Secondary | ICD-10-CM

## 2023-02-18 DIAGNOSIS — R8290 Unspecified abnormal findings in urine: Secondary | ICD-10-CM

## 2023-02-18 DIAGNOSIS — R3911 Hesitancy of micturition: Secondary | ICD-10-CM

## 2023-02-18 MED ORDER — SCOPOLAMINE 1 MG/3DAYS TD PT72
1.0000 | MEDICATED_PATCH | TRANSDERMAL | 12 refills | Status: DC
Start: 2023-02-18 — End: 2023-04-24

## 2023-02-18 NOTE — Patient Instructions (Signed)
It was a pleasure to see you today.  Thank you for giving [name] the opportunity to be involved in your care.  Below is a brief recap of your visit and next steps.  We will plan to see you again in 3 months.  Summary Switch back to scopolamine patches Repeat labs Follow up in 3 months

## 2023-02-18 NOTE — Progress Notes (Signed)
Established Patient Office Visit  Subjective   Patient ID: Lauren [name], female    DOB: May 27, 1980  Age: 42 y.o. MRN: [id number]  Chief Complaint  Patient presents with   Medical Management of Chronic Issues    3 month   Lauren [name] returns to care today for routine follow-up.  She was last evaluated by me on 10/4 for an acute visit endorsing an itching rash that had developed around the site where she had been applying scopolamine patches.  Scopolamine was discontinued and metoclopramide was added for as needed nausea relief.  Trazodone was also increased to 150 mg nightly for improved treatment of insomnia.  There have been no acute interval events.  Today Lauren [name] reports feeling fairly well.  She endorses urinary hesitancy and foul odor of urine.  Denies dysuria.  She would like to have her urine checked to make sure she does not have a UTI.  She does not have any additional concerns to discuss.  Metoclopramide has not been effective for nausea relief.  She would like to resume scopolamine patches.  Past Medical History:  Diagnosis Date   Anemia    CAD (coronary artery disease)    a. s/p cath in 03/2014 showing 30% mid-LAD, moderate to severe disease along small D1, patent LCx, moderate to severe distal OM2 stenosis and moderate diffuse diease along RCA not amenable to PCI   CHF (congestive heart failure) (HCC)    a. EF 55-60% in 12/2019 b. EF at 35-40% by echo in 05/2020   CKD (chronic kidney disease) stage 4, GFR 15-29 ml/min (HCC)    Diabetes mellitus without complication (HCC)    Myocardial infarction (HCC)    Neuropathy    PVD (peripheral vascular disease) (HCC)    Stroke (HCC) 01/2022   L hand weakness   Past Surgical History:  Procedure Laterality Date   ABDOMINAL AORTOGRAM W/LOWER EXTREMITY N/A 06/10/2022   Procedure: ABDOMINAL AORTOGRAM W/LOWER EXTREMITY;  Surgeon: Cephus Shelling, MD;  Location: [facility] INVASIVE CV LAB;  Service: Cardiovascular;  Laterality: N/A;    AMPUTATION Left 09/02/2021   Procedure: AMPUTATION RAY;  Surgeon: Edwin Cap, DPM;  Location: [facility] OR;  Service: Podiatry;  Laterality: Left;  sagittal saw, 3L bag saline & Pulse   Cardiac catherization     CHOLECYSTECTOMY     I & D EXTREMITY Left 08/30/2021   Procedure: IRRIGATION AND DEBRIDEMENT WITH BONE BIOPSY;  Surgeon: Candelaria Stagers, DPM;  Location: [facility] OR;  Service: Podiatry;  Laterality: Left;   IR ANGIO VERTEBRAL SEL SUBCLAVIAN INNOMINATE UNI L MOD SED  01/18/2022   IR CT HEAD LTD  01/08/2022   IR INTRA CRAN STENT  01/08/2022   IR PERCUTANEOUS ART THROMBECTOMY/INFUSION INTRACRANIAL INC DIAG ANGIO  01/08/2022   IRRIGATION AND DEBRIDEMENT FOOT Left 09/04/2021   Procedure: IRRIGATION AND DEBRIDEMENT FOOT AND CLOSURE;  Surgeon: Edwin Cap, DPM;  Location: [facility] OR;  Service: Podiatry;  Laterality: Left;   LEFT HEART CATH AND CORONARY ANGIOGRAPHY N/A 07/26/2022   Procedure: LEFT HEART CATH AND CORONARY ANGIOGRAPHY;  Surgeon: [place], Peter M, MD;  Location: [facility] INVASIVE CV LAB;  Service: Cardiovascular;  Laterality: N/A;   METATARSAL OSTEOTOMY Left 07/21/2022   Procedure: LEFT FOOT EXCISION OF FIFTH METATARSAL WITH DEBRIDEMENT OF ULCER, POSSIBLE BONE BIOPSY OF THE FOURTH METATARSAL, POSSIBLE FOURTH RAY AMPUTATION OF LEFT TOEAND METATARSAL;  Surgeon: Vivi Barrack, DPM;  Location: [facility] OR;  Service: Podiatry;  Laterality: Left;   PERIPHERAL VASCULAR BALLOON ANGIOPLASTY  Left 06/10/2022   Procedure: PERIPHERAL VASCULAR BALLOON ANGIOPLASTY;  Surgeon: Cephus Shelling, MD;  Location: [facility] INVASIVE CV LAB;  Service: Cardiovascular;  Laterality: Left;  AT and DP   PERIPHERAL VASCULAR INTERVENTION Left 06/10/2022   Procedure: PERIPHERAL VASCULAR INTERVENTION;  Surgeon: Cephus Shelling, MD;  Location: [facility] INVASIVE CV LAB;  Service: Cardiovascular;  Laterality: Left;  SFA   RADIOLOGY WITH ANESTHESIA N/A 01/08/2022   Procedure: IR WITH ANESTHESIA;  Surgeon: Radiologist, Medication, MD;  Location: [facility] OR;   Service: Radiology;  Laterality: N/A;   THROMBECTOMY BRACHIAL ARTERY Right 11/18/2021   Procedure: RIGHT BRACHIAL, RADIAL, & ULNAR ARTERY THROMBECTOMY.;  Surgeon: Cephus Shelling, MD;  Location: [facility] OR;  Service: Vascular;  Laterality: Right;   TUBAL LIGATION     Social History   Tobacco Use   Smoking status: Former    Current packs/day: 0.00    Average packs/day: 1 pack/day for 15.0 years (15.0 ttl pk-yrs)    Types: Cigarettes    Start date: 01/2007    Quit date: 01/2022    Years since quitting: 1.2    Passive exposure: Past   Smokeless tobacco: Never  Vaping Use   Vaping status: Never Used  Substance Use Topics   Alcohol use: Yes    Comment: occ   Drug use: No   Family History  Problem Relation Age of Onset   Thyroid disease Mother    Hypertension Mother    Hyperlipidemia Mother    CAD Mother    CVA Mother    Hyperlipidemia Father    Hypertension Father    CAD Father    Breast cancer Paternal Grandmother 36   Cancer Paternal Grandmother        lung   Allergies  Allergen Reactions   Visipaque [Iodixanol] Nausea And Vomiting    Patient had vagal response during procedure became hypertensive, and bradycardic. CO2 used during procedure prior to contrast being used, this may be cause as well. Recommended premedicating prior to contrast being used in the future.    Wellbutrin [Bupropion] Hives   Gabapentin     dizzy   Cefepime Rash    Tolerates penicilllin   Ciprofloxacin Hcl Hives and Rash    Hives/rash at injection site; 01/15/22 tolerated IV cipro   Tape Rash   Review of Systems  Constitutional:  Negative for chills and fever.  HENT:  Negative for sore throat.   Respiratory:  Negative for cough and shortness of breath.   Cardiovascular:  Negative for chest pain, palpitations and leg swelling.  Gastrointestinal:  Positive for nausea (Chronic). Negative for abdominal pain, blood in stool, constipation, diarrhea and vomiting.  Genitourinary:  Negative for  dysuria and hematuria.       Hesitancy and foul odor of urine.  Musculoskeletal:  Negative for myalgias.  Skin:  Negative for itching and rash.  Neurological:  Negative for dizziness and headaches.  Psychiatric/Behavioral:  Negative for depression and suicidal ideas.      Objective:     BP (!) 142/85   Pulse 84   Temp 98.3 F (36.8 C)   Ht 5\' 5"  (1.651 m)   Wt 224 lb (101.6 kg)   SpO2 98%   BMI 37.28 kg/m  BP Readings from Last 3 Encounters:  03/16/23 132/85  03/06/23 (!) 144/89  02/18/23 (!) 142/85   Physical Exam Vitals reviewed.  Constitutional:      Comments: Examined in wheelchair  HENT:     Head: Normocephalic and atraumatic.  Right Ear: External ear normal.     Left Ear: External ear normal.     Nose: Nose normal.     Mouth/Throat:     Mouth: Mucous membranes are moist.     Pharynx: Oropharynx is clear. No oropharyngeal exudate or posterior oropharyngeal erythema.  Eyes:     General: No scleral icterus.    Extraocular Movements: Extraocular movements intact.     Conjunctiva/sclera: Conjunctivae normal.     Pupils: Pupils are equal, round, and reactive to light.  Cardiovascular:     Rate and Rhythm: Normal rate and regular rhythm.     Pulses: Normal pulses.     Heart sounds: Murmur heard.  Pulmonary:     Effort: Pulmonary effort is normal.     Breath sounds: Normal breath sounds. No wheezing, rhonchi or rales.  Abdominal:     General: Abdomen is flat. Bowel sounds are normal.     Palpations: Abdomen is soft.  Musculoskeletal:     Cervical back: Normal range of motion.  Skin:    General: Skin is warm and dry.  Neurological:     Mental Status: She is alert and oriented to person, place, and time.     Cranial Nerves: No cranial nerve deficit.     Motor: Weakness (Left arm and leg.  Weakness is more prominent in the left upper extremity) present.  Psychiatric:        Mood and Affect: Mood normal.        Behavior: Behavior normal.   Last CBC Lab  Results  Component Value Date   WBC 8.0 03/16/2023   HGB 11.3 (L) 03/16/2023   HCT 34.5 (L) 03/16/2023   MCV 92.0 03/16/2023   MCH 30.1 03/16/2023   RDW 13.3 03/16/2023   PLT 299 03/16/2023   Last metabolic panel Lab Results  Component Value Date   GLUCOSE 300 (H) 03/16/2023   NA 135 03/16/2023   K 3.8 03/16/2023   CL 106 03/16/2023   CO2 23 03/16/2023   BUN 32 (H) 03/16/2023   CREATININE 2.78 (H) 03/16/2023   GFRNONAA 21 (L) 03/16/2023   CALCIUM 7.5 (L) 03/16/2023   PHOS 4.4 01/29/2022   PROT 5.4 (L) 03/14/2023   ALBUMIN 2.6 (L) 03/14/2023   LABGLOB 3.2 04/20/2022   AGRATIO 1.0 (L) 04/20/2022   BILITOT 0.4 03/14/2023   ALKPHOS 55 03/14/2023   AST 16 03/14/2023   ALT 17 03/14/2023   ANIONGAP 6 03/16/2023   Last lipids Lab Results  Component Value Date   CHOL 118 07/26/2022   HDL 44 07/26/2022   LDLCALC 57 07/26/2022   LDLDIRECT 84.9 01/01/2020   TRIG 84 07/26/2022   CHOLHDL 2.7 07/26/2022   Last hemoglobin A1c Lab Results  Component Value Date   HGBA1C 8.3 (A) 10/18/2022   Last thyroid functions Lab Results  Component Value Date   TSH 21.271 (H) 03/14/2023   Last vitamin D Lab Results  Component Value Date   VD25OH 15.9 (L) 06/01/2022   Last vitamin B12 and Folate Lab Results  Component Value Date   VITAMINB12 433 04/20/2022   FOLATE 10.3 04/20/2022     Assessment & Plan:   Problem List Items Addressed This Visit       PVD (peripheral vascular disease) (HCC) (Chronic)    She remains on warfarin/Plavix and statin therapy.      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  A1c 8.3 on labs from June.  Endocrinology follow-up scheduled for 10/21. -Repeat urine microalbumin/creatinine ratio ordered today      Primary hypertension    Remains adequately controlled on current antihypertensive regimen.  No medication changes are indicated at this time.      Hypothyroidism    She is currently prescribed  levothyroxine 125 mcg daily.  TSH elevated (9.8) in June.  She is asymptomatic currently.  Repeat TSH/T4 ordered today.      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)    CKD stage IV secondary to T1DM.  She has recently established care with nephrology and is due for follow-up.      Vitamin D deficiency    Noted on labs from January.  She has completed high-dose, weekly vitamin D supplementation.  Repeat vitamin D level ordered today.      Mixed hyperlipidemia    Lipid panel updated earlier this year.  Total cholesterol 118 and LDL 57.  She is currently prescribed atorvastatin 40 mg daily.  No medication changes are indicated at this time.      Insomnia    Improved with increasing trazodone to 150 mg nightly      Chronic nausea    Scopolamine patches were recently discontinued due to concern for rash.  Metoclopramide 10 mg 4 times daily as needed for nausea relief was prescribed.  She states that these have been ineffective and she would like to resume scopolamine patches. -New order for scopolamine patches placed today      Abnormal urine odor    She endorses urinary hesitancy and foul odor of urine and is concerned for UTI.  UA pending.      Return in about 3 months (around 05/21/2023).   Billie Lade, MD

## 2023-02-19 LAB — CBC
Hematocrit: 38.4 % (ref 34.0–46.6)
Hemoglobin: 12.3 g/dL (ref 11.1–15.9)
MCH: 29.8 pg (ref 26.6–33.0)
MCHC: 32 g/dL (ref 31.5–35.7)
MCV: 93 fL (ref 79–97)
Platelets: 347 10*3/uL (ref 150–450)
RBC: 4.13 x10E6/uL (ref 3.77–5.28)
RDW: 14 % (ref 11.7–15.4)
WBC: 7.4 10*3/uL (ref 3.4–10.8)

## 2023-02-19 LAB — TSH+FREE T4
Free T4: 1.33 ng/dL (ref 0.82–1.77)
TSH: 3.36 u[IU]/mL (ref 0.450–4.500)

## 2023-02-21 ENCOUNTER — Other Ambulatory Visit: Payer: Self-pay | Admitting: Internal Medicine

## 2023-02-21 ENCOUNTER — Ambulatory Visit: Payer: BC Managed Care – PPO | Attending: Student | Admitting: Physician Assistant

## 2023-02-21 ENCOUNTER — Ambulatory Visit: Payer: BC Managed Care – PPO | Admitting: Nurse Practitioner

## 2023-02-21 ENCOUNTER — Encounter: Payer: Self-pay | Admitting: Physician Assistant

## 2023-02-21 DIAGNOSIS — E782 Mixed hyperlipidemia: Secondary | ICD-10-CM

## 2023-02-21 DIAGNOSIS — I10 Essential (primary) hypertension: Secondary | ICD-10-CM

## 2023-02-21 DIAGNOSIS — E1065 Type 1 diabetes mellitus with hyperglycemia: Secondary | ICD-10-CM

## 2023-02-21 DIAGNOSIS — E039 Hypothyroidism, unspecified: Secondary | ICD-10-CM

## 2023-02-21 DIAGNOSIS — Z794 Long term (current) use of insulin: Secondary | ICD-10-CM

## 2023-02-22 ENCOUNTER — Telehealth: Payer: Self-pay | Admitting: Nurse Practitioner

## 2023-02-22 NOTE — Telephone Encounter (Signed)
Patient no showed recent appt.  Attempt will be made to reschedule patient.

## 2023-03-05 ENCOUNTER — Emergency Department (HOSPITAL_COMMUNITY)
Admission: EM | Admit: 2023-03-05 | Discharge: 2023-03-06 | Disposition: A | Discharge status: Home / Self Care | Payer: BC Managed Care – PPO

## 2023-03-05 ENCOUNTER — Encounter (HOSPITAL_COMMUNITY): Payer: Self-pay

## 2023-03-05 ENCOUNTER — Other Ambulatory Visit: Payer: Self-pay

## 2023-03-05 ENCOUNTER — Emergency Department (HOSPITAL_COMMUNITY): Payer: BC Managed Care – PPO

## 2023-03-05 DIAGNOSIS — R440 Auditory hallucinations: Secondary | ICD-10-CM | POA: Diagnosis present

## 2023-03-05 DIAGNOSIS — Z794 Long term (current) use of insulin: Secondary | ICD-10-CM | POA: Insufficient documentation

## 2023-03-05 DIAGNOSIS — R441 Visual hallucinations: Secondary | ICD-10-CM | POA: Diagnosis not present

## 2023-03-05 DIAGNOSIS — Z7901 Long term (current) use of anticoagulants: Secondary | ICD-10-CM | POA: Diagnosis not present

## 2023-03-05 DIAGNOSIS — I2510 Atherosclerotic heart disease of native coronary artery without angina pectoris: Secondary | ICD-10-CM | POA: Diagnosis not present

## 2023-03-05 DIAGNOSIS — F39 Unspecified mood [affective] disorder: Secondary | ICD-10-CM | POA: Insufficient documentation

## 2023-03-05 DIAGNOSIS — F69 Unspecified disorder of adult personality and behavior: Secondary | ICD-10-CM | POA: Diagnosis present

## 2023-03-05 DIAGNOSIS — R443 Hallucinations, unspecified: Secondary | ICD-10-CM

## 2023-03-05 LAB — ETHANOL: Alcohol, Ethyl (B): <10 mg/dL (ref <10)

## 2023-03-05 LAB — URINALYSIS, ROUTINE W REFLEX MICROSCOPIC
Bilirubin Urine: NEGATIVE
Glucose, UA: 150 mg/dL — AB
Ketones, ur: NEGATIVE mg/dL
Nitrite: NEGATIVE
Protein, ur: >=300 mg/dL — AB
Specific Gravity, Urine: 1.017 (ref 1.005–1.030)
pH: 5 (ref 5.0–8.0)

## 2023-03-05 LAB — CBC WITH DIFFERENTIAL/PLATELET
Abs Immature Granulocytes: 0.03 10*3/uL (ref 0.00–0.07)
Basophils Absolute: 0.1 10*3/uL (ref 0.0–0.1)
Basophils Relative: 1 %
Eosinophils Absolute: 0.1 10*3/uL (ref 0.0–0.5)
Eosinophils Relative: 1 %
HCT: 40.3 % (ref 36.0–46.0)
Hemoglobin: 13.2 g/dL (ref 12.0–15.0)
Immature Granulocytes: 0 %
Lymphocytes Relative: 19 %
Lymphs Abs: 1.8 10*3/uL (ref 0.7–4.0)
MCH: 29.9 pg (ref 26.0–34.0)
MCHC: 32.8 g/dL (ref 30.0–36.0)
MCV: 91.2 fL (ref 80.0–100.0)
Monocytes Absolute: 0.6 10*3/uL (ref 0.1–1.0)
Monocytes Relative: 6 %
Neutro Abs: 6.9 10*3/uL (ref 1.7–7.7)
Neutrophils Relative %: 73 %
Platelets: 387 10*3/uL (ref 150–400)
RBC: 4.42 MIL/uL (ref 3.87–5.11)
RDW: 13.6 % (ref 11.5–15.5)
WBC: 9.4 10*3/uL (ref 4.0–10.5)
nRBC: 0 % (ref 0.0–0.2)

## 2023-03-05 LAB — COMPREHENSIVE METABOLIC PANEL
ALT: 26 U/L (ref 0–44)
AST: 22 U/L (ref 15–41)
Albumin: 3.4 g/dL — ABNORMAL LOW (ref 3.5–5.0)
Alkaline Phosphatase: 59 U/L (ref 38–126)
Anion gap: 8 (ref 5–15)
BUN: 24 mg/dL — ABNORMAL HIGH (ref 6–20)
CO2: 22 mmol/L (ref 22–32)
Calcium: 8.7 mg/dL — ABNORMAL LOW (ref 8.9–10.3)
Chloride: 108 mmol/L (ref 98–111)
Creatinine, Ser: 2.83 mg/dL — ABNORMAL HIGH (ref 0.44–1.00)
GFR, Estimated: 21 mL/min — ABNORMAL LOW (ref >60)
Glucose, Bld: 307 mg/dL — ABNORMAL HIGH (ref 70–99)
Potassium: 3.8 mmol/L (ref 3.5–5.1)
Sodium: 138 mmol/L (ref 135–145)
Total Bilirubin: 0.6 mg/dL (ref 0.3–1.2)
Total Protein: 7 g/dL (ref 6.5–8.1)

## 2023-03-05 LAB — PROTIME-INR
INR: 2.6 — ABNORMAL HIGH (ref 0.8–1.2)
Prothrombin Time: 27.8 s — ABNORMAL HIGH (ref 11.4–15.2)

## 2023-03-05 LAB — CBG MONITORING, ED
Glucose-Capillary: 243 mg/dL — ABNORMAL HIGH (ref 70–99)
Glucose-Capillary: 514 mg/dL (ref 70–99)

## 2023-03-05 LAB — RAPID URINE DRUG SCREEN, HOSP PERFORMED
Amphetamines: NOT DETECTED
Barbiturates: NOT DETECTED
Benzodiazepines: NOT DETECTED
Cocaine: NOT DETECTED
Opiates: NOT DETECTED
Tetrahydrocannabinol: NOT DETECTED

## 2023-03-05 LAB — PREGNANCY, URINE: Preg Test, Ur: NEGATIVE

## 2023-03-05 MED ORDER — LORAZEPAM 1 MG PO TABS
1.0000 mg | ORAL_TABLET | Freq: Once | ORAL | Status: AC
  Administered 2023-03-05: 1 mg via ORAL
  Filled 2023-03-05: qty 1

## 2023-03-05 MED ORDER — INSULIN ASPART 100 UNIT/ML IJ SOLN
0.0000 [IU] | Freq: Three times a day (TID) | INTRAMUSCULAR | Status: DC
Start: 2023-03-06 — End: 2023-03-07
  Administered 2023-03-06 (×2): 11 [IU] via SUBCUTANEOUS
  Filled 2023-03-05 (×2): qty 1

## 2023-03-05 MED ORDER — INSULIN ASPART 100 UNIT/ML IJ SOLN
15.0000 [IU] | Freq: Once | INTRAMUSCULAR | Status: AC
  Administered 2023-03-05: 15 [IU] via SUBCUTANEOUS
  Filled 2023-03-05: qty 1

## 2023-03-05 MED ORDER — LORAZEPAM 2 MG/ML IJ SOLN: 1.0000 mg | Freq: Four times a day (QID) | INTRAMUSCULAR | Status: DC | PRN

## 2023-03-05 MED ORDER — FOSFOMYCIN TROMETHAMINE 3 G PO PACK
3.0000 g | PACK | Freq: Once | ORAL | Status: AC
  Administered 2023-03-05: 3 g via ORAL
  Filled 2023-03-05: qty 3
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
## 2023-03-05 NOTE — ED Provider Notes (Signed)
[place] EMERGENCY DEPARTMENT AT [facility] Provider Note   CSN: [id number] Arrival date & time: 03/05/23  1433     History  Chief Complaint  Patient presents with   Psychiatric Evaluation    Lauren [name] is a 42 y.o. female.  42 year old female with past medical history of coronary artery disease and CVA last year presenting to the emergency department today with apparent visual and auditory hallucinations.  The patient has apparently had some issues with this since her stroke last year.  Her family states that she has been having more visual and auditory hallucinations over the past few weeks.  They report that she has made some abnormal statements and appears to be exhibiting poor judgment.  1 example is that the patient apparently was speaking with someone online that she does not know and was apparently planning on moving to [place].  The patient when asked about this states "I want to be free".  She is denying any suicidal or homicidal ideations.  She has been afebrile.        Home Medications Prior to Admission medications   Medication Sig Start Date End Date Taking? Authorizing Provider  acetaminophen (TYLENOL) 325 MG tablet Take 1-2 tablets (325-650 mg total) by mouth every 4 (four) hours as needed for mild pain. Patient taking differently: Take 650 mg by mouth as needed for mild pain (pain score 1-3). 02/18/22  Yes Love, Evlyn Kanner, PA-C  atorvastatin (LIPITOR) 40 MG tablet TAKE 1 TABLET BY MOUTH ONCE DAILY. 01/25/23  Yes Billie Lade, MD  clopidogrel (PLAVIX) 75 MG tablet Take 1 tablet (75 mg total) by mouth daily. 04/29/22  Yes Micki Riley, MD  furosemide (LASIX) 40 MG tablet Take 40 mg daily as needed for swelling Patient taking differently: Take 20 mg by mouth daily as needed for edema or fluid. 03/05/22  Yes BranchDorothe Pea, MD  hydrALAZINE (APRESOLINE) 25 MG tablet Take 1 tablet (25 mg total) by mouth every 8 (eight) hours. Patient taking  differently: Take 25 mg by mouth in the morning and at bedtime. 11/12/22  Yes Noralee Stain, DO  ibuprofen (ADVIL) 200 MG tablet Take 200 mg by mouth every 6 (six) hours as needed for mild pain (pain score 1-3).   Yes [provider]  insulin aspart (NOVOLOG) 100 UNIT/ML injection Use with Omnipod for daily dose around 100 units daily Patient taking differently: Inject 0-100 Units into the skin See admin instructions. Use with Omnipod 10/18/22  Yes Reardon, Freddi Starr, NP  isosorbide mononitrate (IMDUR) 60 MG 24 hr tablet TAKE 2 TABLETS BY MOUTH ONCE DAILY. 02/21/23  Yes Billie Lade, MD  levothyroxine (SYNTHROID) 125 MCG tablet Take 1 tablet (125 mcg total) by mouth daily at 6 (six) AM. 06/17/22 06/12/23 Yes Reardon, Freddi Starr, NP  metoCLOPramide (REGLAN) 10 MG tablet Take 10 mg by mouth 4 (four) times daily. 02/24/23  Yes [provider]  metoprolol succinate (TOPROL-XL) 50 MG 24 hr tablet Take 50 mg by mouth daily. 02/24/23  Yes [provider]  Multiple Vitamin (MULTIVITAMIN WITH MINERALS) TABS tablet Take 1 tablet by mouth daily. 02/18/22  Yes Love, Evlyn Kanner, PA-C  nitroGLYCERIN (NITROSTAT) 0.4 MG SL tablet Place 1 tablet (0.4 mg total) under the tongue every 5 (five) minutes x 3 doses as needed for chest pain. 02/18/22  Yes Love, Evlyn Kanner, PA-C  oxyCODONE-acetaminophen (PERCOCET) 7.5-325 MG tablet Take 1 tablet by mouth daily as needed for severe pain. 02/08/23  Yes  Fanny Dance, MD  pantoprazole (PROTONIX) 40 MG tablet Take 1 tablet (40 mg total) by mouth daily. Courtesy fill/ pt to get established with a primary MD for further refills Patient taking differently: Take 40 mg by mouth daily as needed (reflux). 06/01/22 03/05/23 Yes Billie Lade, MD  ranolazine (RANEXA) 1000 MG SR tablet Take 1 tablet (1,000 mg total) by mouth 2 (two) times daily. 01/20/23  Yes Billie Lade, MD  scopolamine (TRANSDERM-SCOP) 1 MG/3DAYS Place 1 patch (1.5 mg total) onto the skin  every 3 (three) days. 02/18/23  Yes Billie Lade, MD  senna-docusate (SENOKOT-S) 8.6-50 MG tablet Take 1-2 tablets by mouth daily as needed for mild constipation. 08/11/22  Yes Gardenia Phlegm, MD  tiZANidine (ZANAFLEX) 4 MG tablet Take 1 tablet (4 mg total) by mouth at bedtime. 01/20/23  Yes Billie Lade, MD  topiramate (TOPAMAX) 25 MG tablet Take 0.5-1 tablets (12.5-25 mg total) by mouth at bedtime. Patient taking differently: Take 12.5 mg by mouth at bedtime. 11/22/22 11/22/23 Yes Fanny Dance, MD  traZODone (DESYREL) 150 MG tablet Take 1 tablet (150 mg total) by mouth at bedtime. 02/04/23  Yes Billie Lade, MD  warfarin (COUMADIN) 5 MG tablet TAKE ONE TABLET BY MOUTH DAILY EXCEPT 1/2 TABLET ON [facility] AND SATURDAYS OR AS DIRECTED 02/14/23  Yes Branch, Dorothe Pea, MD  Continuous Glucose Sensor (DEXCOM G6 SENSOR) MISC CHANGE SENSOR EVERY 10 DAYS. 01/05/23   Dani Gobble, NP  Continuous Glucose Transmitter (DEXCOM G6 TRANSMITTER) MISC Change transmitter every 90 days as directed. 01/20/23   Roma Kayser, MD  Insulin Disposable Pump (OMNIPOD 5 G6 PODS, GEN 5,) MISC Change pod every 48-72 hours. 11/11/22   Dani Gobble, NP      Allergies    Visipaque [iodixanol], Wellbutrin [bupropion], Gabapentin, Cefepime, Ciprofloxacin hcl, and Tape    Review of Systems   Review of Systems  Psychiatric/Behavioral:  Positive for confusion and hallucinations.   All other systems reviewed and are negative.   Physical Exam Updated Vital Signs BP (!) 137/91 (BP Location: Left Arm)   Pulse 96   Temp 98 F (36.7 C)   Resp 16   Ht 5\' 5"  (1.651 m)   Wt 101.6 kg   SpO2 94%   BMI 37.27 kg/m  Physical Exam Vitals and nursing note reviewed.   Gen: NAD Eyes: PERRL, EOMI HEENT: no oropharyngeal swelling Neck: trachea midline, no meningismus Resp: clear to auscultation bilaterally Card: RRR, no murmurs, rubs, or gallops Abd: nontender, nondistended Extremities: no calf  tenderness, no edema Vascular: 2+ radial pulses bilaterally, 2+ DP pulses bilaterally Skin: no rashes Psyc: Alert and orient x 3, the patient does have a bizarre affect but does not appear to be reacting to any internal/external stimuli   ED Results / Procedures / Treatments   Labs (all labs ordered are listed, but only abnormal results are displayed) Labs Reviewed  COMPREHENSIVE METABOLIC PANEL - Abnormal; Notable for the following components:      Result Value   Glucose, Bld 307 (*)    BUN 24 (*)    Creatinine, Ser 2.83 (*)    Calcium 8.7 (*)    Albumin 3.4 (*)    GFR, Estimated 21 (*)    All other components within normal limits  URINALYSIS, ROUTINE W REFLEX MICROSCOPIC - Abnormal; Notable for the following components:   Color, Urine AMBER (*)    APPearance CLOUDY (*)    Glucose, UA 150 (*)  Hgb urine dipstick SMALL (*)    Protein, ur >=300 (*)    Leukocytes,Ua SMALL (*)    Bacteria, UA MANY (*)    All other components within normal limits  PROTIME-INR - Abnormal; Notable for the following components:   Prothrombin Time 27.8 (*)    INR 2.6 (*)    All other components within normal limits  CBG MONITORING, ED - Abnormal; Notable for the following components:   Glucose-Capillary 243 (*)    All other components within normal limits  CBG MONITORING, ED - Abnormal; Notable for the following components:   Glucose-Capillary 514 (*)    All other components within normal limits  ETHANOL  RAPID URINE DRUG SCREEN, HOSP PERFORMED  CBC WITH DIFFERENTIAL/PLATELET  PREGNANCY, URINE    EKG EKG Interpretation Date/Time:  Saturday March 05 2023 16:13:11 EDT Ventricular Rate:  89 PR Interval:  136 QRS Duration:  94 QT Interval:  376 QTC Calculation: 457 R Axis:   107  Text Interpretation: Normal sinus rhythm Inferior infarct , age undetermined Anterolateral infarct (cited on or before 03-Aug-2022) Abnormal ECG Confirmed by Beckey Downing [phone]) on 03/05/2023 4:17:42  PM  Radiology MR BRAIN WO CONTRAST  Result Date: 03/05/2023 CLINICAL DATA:  History of stroke 1 year ago. Altered mental status with auditory and visual hallucinations. EXAM: MRI HEAD WITHOUT CONTRAST TECHNIQUE: Multiplanar, multiecho pulse sequences of the brain and surrounding structures were obtained without intravenous contrast. COMPARISON:  CT head 10/21/2022, brain MRI 01/14/2022 FINDINGS: Brain: There is no acute intracranial hemorrhage, extra-axial fluid collection, or acute infarct. Numerous remote infarcts are again seen throughout the right cerebral hemisphere primarily in the MCA distribution, as well as a small remote cortical infarct in the left occipital lobe. There is ex vacuo dilatation of the right lateral ventricle and wallerian degeneration extending into the right midbrain and pons. The pituitary and suprasellar region are normal. There is no mass lesion. There is no mass effect or midline shift. Vascular: The right ICA flow void is abnormal likely reflecting the known stent. Stent patency is not assessed. The MCA flow voids Skull and upper cervical spine: Normal marrow signal. Sinuses/Orbits: The paranasal sinuses are clear. The globes and orbits are unremarkable. Other: The mastoid air cells and middle ear cavities are clear. IMPRESSION: 1. No acute intracranial pathology. 2. Extensive remote infarcts throughout the right cerebral hemisphere primarily in the MCA distribution, and small remote left occipital infarct. Electronically Signed   By: Lesia Hausen M.D.   On: 03/05/2023 18:03   DG Chest 1 View  Result Date: 03/05/2023 CLINICAL DATA:  Cough EXAM: CHEST  1 VIEW COMPARISON:  X-ray 11/07/2022.  Older exams as well FINDINGS: No consolidation, pneumothorax or effusion. No edema. Normal cardiopericardial silhouette. Air-fluid level along the stomach beneath the left hemidiaphragm. Surgical clips in the right upper quadrant of the abdomen. IMPRESSION: No acute cardiopulmonary disease.  Electronically Signed   By: Karen Kays M.D.   On: 03/05/2023 16:09    Procedures Procedures    Medications Ordered in ED Medications  insulin aspart (novoLOG) injection 0-15 Units (has no administration in time range)  fosfomycin (MONUROL) packet 3 g (3 g Oral Given 03/05/23 1851)  LORazepam (ATIVAN) tablet 1 mg (1 mg Oral Given 03/05/23 2343)  insulin aspart (novoLOG) injection 15 Units (15 Units Subcutaneous Given 03/05/23 2345)    ED Course/ Medical Decision Making/ A&P  Medical Decision Making 42 year old female with past medical history of coronary artery disease and CVA in the past presenting to the emergency department today with intermittent visual and auditory hallucinations.  These seem to be more frequent than normal.  I will further evaluate her here with a CT scan of her head in addition to medical workup to evaluate for possible delirium.  It is possible this is due to psychiatric etiology or may be long-term effects from her CVA in the past that are worsening.  She does not have any findings on exam consistent with meningitis.  Given these symptoms and the duration suspicion for encephalitis is low at this time.  I will reevaluate for ultimate disposition.  The patient's EKG interpreted by me shows a sinus rhythm with a rate of 89 with normal axis, normal intervals, nonspecific ST-T changes including some T wave inversions in inferior and lateral leads.  The patient's workup is unremarkable including her MRI.  She was trying to leave.  I do not think the patient has decision-making capacity and is a danger to herself in her current state.  I did place her on IVC.  She is medically cleared for psychiatric evaluation.  Amount and/or Complexity of Data Reviewed Labs: ordered. Radiology: ordered.  Risk Prescription drug management.           Final Clinical Impression(s) / ED Diagnoses Final diagnoses:  Hallucinations    Rx / DC  Orders ED Discharge Orders     None         Durwin Glaze, MD 03/05/23 2349

## 2023-03-05 NOTE — ED Notes (Signed)
See triage notes. Pt states she came with her husband up here to get evaluated but now ready to go. Pt denies si/hi/avh or wishing she was dead. Pt in scrubs and wanded. Best friend and husband at bedside at this time. Pt refused to removed shoes due to a foot problem. I advised family they can go get her some slides but shoes will have to come off since has strings. Pt stated she had seen people that is not there a couple times since her stroke but not since. Denies hearing voices. Pt is a/o. Per friend at bedside stated pt has been making irrational decisions and agreed she needed to be checked out.

## 2023-03-05 NOTE — ED Triage Notes (Signed)
Pt husband reports pt had a stroke 1 year ago and has been having issues lately talking about leaving and going to [place] and she is not able to care for herself and he says she has been seeing things and hearing things and he thinks she needs help.

## 2023-03-05 NOTE — ED Notes (Signed)
Pt in mri 

## 2023-03-06 ENCOUNTER — Encounter (HOSPITAL_COMMUNITY): Payer: Self-pay | Admitting: *Deleted

## 2023-03-06 DIAGNOSIS — F69 Unspecified disorder of adult personality and behavior: Secondary | ICD-10-CM | POA: Diagnosis not present

## 2023-03-06 DIAGNOSIS — R441 Visual hallucinations: Secondary | ICD-10-CM | POA: Diagnosis not present

## 2023-03-06 DIAGNOSIS — F29 Unspecified psychosis not due to a substance or known physiological condition: Secondary | ICD-10-CM

## 2023-03-06 LAB — CBG MONITORING, ED
Glucose-Capillary: 130 mg/dL — ABNORMAL HIGH (ref 70–99)
Glucose-Capillary: 133 mg/dL — ABNORMAL HIGH (ref 70–99)
Glucose-Capillary: 235 mg/dL — ABNORMAL HIGH (ref 70–99)
Glucose-Capillary: 33 mg/dL — CL (ref 70–99)
Glucose-Capillary: 332 mg/dL — ABNORMAL HIGH (ref 70–99)
Glucose-Capillary: 338 mg/dL — ABNORMAL HIGH (ref 70–99)
Glucose-Capillary: 350 mg/dL — ABNORMAL HIGH (ref 70–99)
Glucose-Capillary: 443 mg/dL — ABNORMAL HIGH (ref 70–99)
Glucose-Capillary: 522 mg/dL (ref 70–99)

## 2023-03-06 LAB — PROTIME-INR
INR: 2.2 — ABNORMAL HIGH (ref 0.8–1.2)
Prothrombin Time: 24.7 s — ABNORMAL HIGH (ref 11.4–15.2)

## 2023-03-06 MED ORDER — DEXTROSE 50 % IV SOLN: 25.0000 mL | Freq: Once | INTRAVENOUS | Status: AC

## 2023-03-06 MED ORDER — WARFARIN - PHARMACIST DOSING INPATIENT: Freq: Every day | Status: DC

## 2023-03-06 MED ORDER — TRAZODONE HCL 50 MG PO TABS: 100.0000 mg | ORAL_TABLET | Freq: Every day | ORAL | Status: DC

## 2023-03-06 MED ORDER — DEXTROSE 50 % IV SOLN: 50.0000 mL | Freq: Once | INTRAVENOUS | Status: DC

## 2023-03-06 MED ORDER — INSULIN ASPART 100 UNIT/ML IJ SOLN
15.0000 [IU] | Freq: Once | INTRAMUSCULAR | Status: AC
  Administered 2023-03-06: 15 [IU] via SUBCUTANEOUS

## 2023-03-06 MED ORDER — INSULIN ASPART 100 UNIT/ML IJ SOLN
11.0000 [IU] | Freq: Once | INTRAMUSCULAR | Status: AC
  Administered 2023-03-06: 11 [IU] via SUBCUTANEOUS
  Filled 2023-03-06: qty 1

## 2023-03-06 MED ORDER — DEXTROSE 50 % IV SOLN
INTRAVENOUS | Status: AC
  Filled 2023-03-06: qty 50

## 2023-03-06 MED ORDER — QUETIAPINE FUMARATE 25 MG PO TABS: 50.0000 mg | ORAL_TABLET | Freq: Three times a day (TID) | ORAL | Status: DC | PRN

## 2023-03-06 MED ORDER — DEXTROSE 250 MG/ML IV SOLN
INTRAVENOUS | Status: AC
  Filled 2023-03-06: qty 10

## 2023-03-06 MED ORDER — QUETIAPINE FUMARATE 25 MG PO TABS: 50.0000 mg | ORAL_TABLET | Freq: Every day | ORAL | Status: DC

## 2023-03-06 MED ORDER — HYDROXYZINE HCL 25 MG PO TABS: 25.0000 mg | ORAL_TABLET | Freq: Three times a day (TID) | ORAL | Status: DC | PRN

## 2023-03-06 MED ORDER — DEXTROSE 50 % IV SOLN
INTRAVENOUS | Status: AC
  Administered 2023-03-06: 25 mL via INTRAVENOUS
  Filled 2023-03-06: qty 50

## 2023-03-06 MED ORDER — WARFARIN SODIUM 6 MG PO TABS
6.0000 mg | ORAL_TABLET | Freq: Once | ORAL | Status: DC
  Filled 2023-03-06: qty 1

## 2023-03-06 NOTE — ED Notes (Signed)
EDP notified of hyperglycemia; advised to recheck CBG in 1 hour.

## 2023-03-06 NOTE — ED Notes (Signed)
Husband states he will bring pt insulin pump at 8am. EDP made aware.

## 2023-03-06 NOTE — ED Notes (Signed)
Pt speaking with TTS at this moment

## 2023-03-06 NOTE — ED Provider Notes (Signed)
Emergency Medicine Observation Re-evaluation Note  Lauren [name] is a 42 y.o. female, seen on rounds today.  Pt initially presented to the ED for complaints of Psychiatric Evaluation Currently, the patient is awake.  Overnight her blood sugars were elevated and she received subcu insulin.  Her blood glucoses been periodically checked.  It has come down from almost 600 to 135 at 6 AM.  Repeat check this morning revealed blood sugar in the 30s.  I reassessed the patient.  She is alert, not feeling dizzy or sweaty.  IV will be placed.  Will give her one half amp of D50 at this time.  Hypoglycemia likely because of iatrogenic reasons.  She will also receive juice.  We will monitor her blood sugar closely.  Patient had come in with bizarre behavior and is medically cleared otherwise.  She has been recommended for inpatient psychiatry bed.  Physical Exam  BP (!) 137/91 (BP Location: Left Arm)   Pulse 96   Temp 98 F (36.7 C)   Resp 16   Ht 5\' 5"  (1.651 m)   Wt 101.6 kg   SpO2 94%   BMI 37.27 kg/m  Physical Exam General: No distress Cardiac: Regular rate Lungs: No respiratory distress Psych: Currently calm ED Course / MDM  EKG:EKG Interpretation Date/Time:  Saturday March 05 2023 16:13:11 EDT Ventricular Rate:  89 PR Interval:  136 QRS Duration:  94 QT Interval:  376 QTC Calculation: 457 R Axis:   107  Text Interpretation: Normal sinus rhythm Inferior infarct , age undetermined Anterolateral infarct (cited on or before 03-Aug-2022) Abnormal ECG Confirmed by Beckey Downing [phone]) on 03/05/2023 4:17:42 PM  I have reviewed the labs performed to date as well as medications administered while in observation.  Recent changes in the last 24 hours include no new changes.  Plan  Current plan is for for patient to be admitted to psychiatry bed for stabilization. We will monitor her blood sugar closely.  CRITICAL CARE Performed by: Nandana Krolikowski   Total critical care time: 30 minutes  for dangerous hypoglycemia, review of patient's records, reassessment of patient and intervention.  Critical care time was exclusive of separately billable procedures and treating other patients.  Critical care was necessary to treat or prevent imminent or life-threatening deterioration.  Critical care was time spent personally by me on the following activities: development of treatment plan with patient and/or surrogate as well as nursing, discussions with consultants, evaluation of patient's response to treatment, examination of patient, obtaining history from patient or surrogate, ordering and performing treatments and interventions, ordering and review of laboratory studies, ordering and review of radiographic studies, pulse oximetry and re-evaluation of patient's condition.   Reassessment : Improved overall. Vss and wnl.    Derwood Kaplan, MD 03/10/23 1335

## 2023-03-06 NOTE — ED Notes (Signed)
Spoke with EDP amp D 50 ordered to give half. Coke given at this time to help get pt's sugar up.

## 2023-03-06 NOTE — Discharge Instructions (Signed)
Safety Plan Yamileth Blakeney will reach out to Harvel Quale (Spouse), call 911 or call mobile crisis, or go to nearest emergency room if condition worsens.  Patients' will follow up with [facility] for outpatient psychiatric services (therapy/medication management) if/when felt needed. The suicide prevention education provided includes the following: Suicide risk factors Suicide prevention and interventions National Suicide Hotline telephone number [facility] assessment telephone number [facility] Emergency Assistance 911 [facility] and/or Residential Mobile Crisis Unit telephone number Request made of family/significant other to:  Harvel Quale (Spouse) Remove weapons (e.g., guns, rifles, knives), all items previously/currently identified as safety concern.   Remove drugs/medications (over the counter, prescriptions, illicit drugs), all items previously/currently identified as a safety concern.

## 2023-03-06 NOTE — Consult Note (Signed)
Pharmacy Consult Note - Anticoagulation  Pharmacy Consult for warfarin Indication:  continuation of home therapy (h/o CVA and PVD)  PATIENT MEASUREMENTS: Height: 5\' 5"  (165.1 cm) Weight: 101.6 kg (223 lb 15.8 oz) IBW/kg (Calculated) : 57 HEPARIN DW (KG): 80.4  VITAL SIGNS: Temp: 98.3 F (36.8 C) (11/03 1559) Temp Source: Oral (11/03 1559) BP: 129/92 (11/03 1559) Pulse Rate: 102 (11/03 1559)  Recent Labs    03/05/23 1553 03/06/23 1615  HGB 13.2  --   HCT 40.3  --   PLT 387  --   LABPROT 27.8* 24.7*  INR 2.6* 2.2*  CREATININE 2.83*  --     Estimated Creatinine Clearance: 31.2 mL/min (A) (by C-G formula based on SCr of 2.83 mg/dL (H)).  PAST MEDICAL HISTORY: Past Medical History:  Diagnosis Date   Anemia    CAD (coronary artery disease)    a. s/p cath in 03/2014 showing 30% mid-LAD, moderate to severe disease along small D1, patent LCx, moderate to severe distal OM2 stenosis and moderate diffuse diease along RCA not amenable to PCI   CHF (congestive heart failure) (HCC)    a. EF 55-60% in 12/2019 b. EF at 35-40% by echo in 05/2020   CKD (chronic kidney disease) stage 4, GFR 15-29 ml/min (HCC)    Diabetes mellitus without complication (HCC)    Myocardial infarction (HCC)    Neuropathy    PVD (peripheral vascular disease) (HCC)    Stroke (HCC) 01/2022   L hand weakness    ASSESSMENT: 42 y.o. female with PMH CVA, PVD is presenting with behavioral concerns. Patient is on chronic anticoagulation with warfarin. Last dose prior to admission was on 11/1. Pharmacy has been consulted to initiate and manage warfarin.  Pertinent medications: Home regimen: Warfarin 5 mg Mon, Tues, Thurs, Fri, Sun Warfarin 2.5 mg Wed, Sat Total weekly warfarin: 30 mg Average daily dose: ~4.5 mg  Goal(s) of therapy: INR 2 - 3 Monitor platelets by anticoagulation protocol: Yes   Baseline anticoagulation labs: Recent Labs    03/05/23 1553 03/06/23 1615  INR 2.6* 2.2*  HGB 13.2  --    PLT 387  --     Date INR Warfarin Dose 11/1 unk 5 mg (PTA) 11/2 2.6 NONE 11/3 2.3 6 mg    PLAN: Give warfarin 6 mg x 1 Daily INR Continue to monitor for signs & symptoms of bleeding   Will M. Dareen Piano, PharmD Clinical Pharmacist 03/06/2023 5:44 PM

## 2023-03-06 NOTE — ED Notes (Signed)
CBG 350 

## 2023-03-06 NOTE — ED Notes (Signed)
Pts left arm and hand has swelling, MD notified and ice pack applied. Pts arm and hand was not like this yesterday.

## 2023-03-06 NOTE — Consult Note (Addendum)
Telepsych Consultation Progress Note  Reason for Consult: Psych Consult  Referring Physician:  Durwin Glaze, MD  Location of Patient: [name] Location of Provider: Other: [facility]  Patient Identification: Lauren [name] MRN:  [id number] Principal Diagnosis: Behavior concern in adult Diagnosis:  Principal Problem:   Behavior concern in adult Active Problems:   Episodes of formed visual hallucinations   Total Time spent with patient: 30 minutes  Subjective:    Lauren [name] is a 42 y.o. Caucasian female with a past reported psychiatric history of bipolar and depression as a teenager, with pertinent medical comorbidities that include CAD, CHF, CKD, R MCA CVA, DM1, MI, who presented this encounter by way of family due to concerns for a recent increase in poor judgment, impulsivity, and hallucinations since the patient had a stroke one year ago.    HPI:   Patient seen today from the [facility] emergency department while the patient was physically located at the [facility] emergency department.  Patient seen via telemetry psych consultation communications on the secure network.  Upon evaluation, patient endorses that her family had her brought in due to concerns for very brief visual hallucinations that she has been having since she had her stroke 1 year ago, as well as to she states to stop her from going to be with her new boyfriend named, "Ian Malkin" she met online through [employer] and she has been talking to for the past 9 months.  Discussing this new boyfriend she states that she has been talking to for the last 9 months, states that he is a local comedian from [place] who she is very fond of, states that her husband and friend brought her in just prior to attempting to leave with her new boyfriend.  Patient states that she originally came voluntarily to the hospital with her husband and friend to be evaluated to reduce their concerns, but states that after she got here she  was placed under involuntary commitment, and told that she does not have capacity to make her own decisions, and that she is going to be held for problems with her mental health.   Patient endorses that when she was told that she was going to be placed under involuntary commitment, states that this is when her mood changed from relatively euthymic to now dysphoric, states that during our evaluation her mood remains largely dysphoric, due to being held at the facility and her feeling that she does not need to be here.  Patient endorses that it it is true, she has been having brief periods of visual hallucinations, states that on several occasions she has briefly, "swore" that she saw other individuals such as a friend in a vehicle, but denies any significant periods of visual hallucinations that are long-lasting or distressing, as well as denies auditory hallucinations.  Patient endorses that she sleeps and eats fairly well, states though that prior to coming in she stayed 2 days with her mother and did not sleep well, states that her mom frequently leaves music on in the house, and that, "if it is not completely pitch dark and silent I can't usually sleep well".  Patient endorses no suicidal homicidal ideations and denies auditory and visual hallucinations during evaluation, and objectively, does not present with psychotic features or appear to be RTIS.  Patient orientation was intact upon assessment, no concerns for fluctuations in consciousness.  Patient endorsed no history of self-injurious behavior, but did endorse x 1 suicide attempt at the age  of 16 which required inpatient hospitalization, states that this led to diagnosis of depression/bipolar, as well as brief utilization of outpatient mental health services and medication use with Wellbutrin and Prozac; denies any mental health diagnoses or care as an adult, forwards that if she did have any problems with her mental health, that she would reach out  to her primary care provider who she sees regularly, states that she last saw her PCP on the 18th of last month (accurate).   Patient endorses no current or past use of drugs or EtOH use, but endorses that she used to be a heavy smoker for some time, states though that she quit when she had a stroke a year ago. Patient endorses during evaluation that she still has desire to be with her new boyfriend, states that between them they have already worked out extensively that she has care needs such as help with her ADLs, which currently she states that her sister when she is not working helps her with these.  Discussed with patient that conversation would be held with her husband if she was amenable, to which patient reported she was amenable to this provider speaking to her husband, as well as returning home with her husband with safety plan in place while they worked out their separation.  Discussed with patient that if brief and intermittent visual hallucinations continue to be a problem for her going forward that it would be the recommendation to consider psychopharmacological intervention, but patient declined at this time during evaluation.  Collateral, patient's husband, Harvel Quale, contacted at [phone]  Call placed and extensive conversation held with the patient's husband.  During conversation, patient's husband reiterates and affirms collateral information provided this encounter to Dr. Gilman Schmidt. Discussed today with the patient's husband that upon evaluation the patient appreciably has capacity to make her own decisions, she does not present with suicidal homicidal ideations and/or concerns for decompensation into psychosis, and ultimately has the right to request discharge, given that she has  not been deemed an imminent risk for self or others. Patient's husband reports that he understands, states that he just wanted to have her evaluated to make sure something more serious was not going on or  that needed to be addressed. Discussed with husband extensively safety planning that needed to be put into place for safe discharge, which husband verbalized ability to implement and adhere.   Past Psychiatric History: Depression and bipolar as teenager   Risk to Self: None Risk to Others: None Prior Inpatient Therapy: Reports inpatient hospitalization x 1 as a teenager for suicide attempt at age 61 Prior Outpatient Therapy: Reports brief outpatient utilization x 1 as a teenager at age 36  Past Medical History:  Past Medical History:  Diagnosis Date   Anemia    CAD (coronary artery disease)    a. s/p cath in 03/2014 showing 30% mid-LAD, moderate to severe disease along small D1, patent LCx, moderate to severe distal OM2 stenosis and moderate diffuse diease along RCA not amenable to PCI   CHF (congestive heart failure) (HCC)    a. EF 55-60% in 12/2019 b. EF at 35-40% by echo in 05/2020   CKD (chronic kidney disease) stage 4, GFR 15-29 ml/min (HCC)    Diabetes mellitus without complication (HCC)    Myocardial infarction (HCC)    Neuropathy    PVD (peripheral vascular disease) (HCC)    Stroke (HCC) 01/2022   L hand weakness    Past Surgical History:  Procedure Laterality Date  ABDOMINAL AORTOGRAM W/LOWER EXTREMITY N/A 06/10/2022   Procedure: ABDOMINAL AORTOGRAM W/LOWER EXTREMITY;  Surgeon: Cephus Shelling, MD;  Location: [facility] INVASIVE CV LAB;  Service: Cardiovascular;  Laterality: N/A;   AMPUTATION Left 09/02/2021   Procedure: AMPUTATION RAY;  Surgeon: Edwin Cap, DPM;  Location: [facility] OR;  Service: Podiatry;  Laterality: Left;  sagittal saw, 3L bag saline & Pulse   Cardiac catherization     CHOLECYSTECTOMY     I & D EXTREMITY Left 08/30/2021   Procedure: IRRIGATION AND DEBRIDEMENT WITH BONE BIOPSY;  Surgeon: Candelaria Stagers, DPM;  Location: [facility] OR;  Service: Podiatry;  Laterality: Left;   IR ANGIO VERTEBRAL SEL SUBCLAVIAN INNOMINATE UNI L MOD SED  01/18/2022   IR CT HEAD LTD   01/08/2022   IR INTRA CRAN STENT  01/08/2022   IR PERCUTANEOUS ART THROMBECTOMY/INFUSION INTRACRANIAL INC DIAG ANGIO  01/08/2022   IRRIGATION AND DEBRIDEMENT FOOT Left 09/04/2021   Procedure: IRRIGATION AND DEBRIDEMENT FOOT AND CLOSURE;  Surgeon: Edwin Cap, DPM;  Location: [facility] OR;  Service: Podiatry;  Laterality: Left;   LEFT HEART CATH AND CORONARY ANGIOGRAPHY N/A 07/26/2022   Procedure: LEFT HEART CATH AND CORONARY ANGIOGRAPHY;  Surgeon: Swaziland, Peter M, MD;  Location: [facility] INVASIVE CV LAB;  Service: Cardiovascular;  Laterality: N/A;   METATARSAL OSTEOTOMY Left 07/21/2022   Procedure: LEFT FOOT EXCISION OF FIFTH METATARSAL WITH DEBRIDEMENT OF ULCER, POSSIBLE BONE BIOPSY OF THE FOURTH METATARSAL, POSSIBLE FOURTH RAY AMPUTATION OF LEFT TOEAND METATARSAL;  Surgeon: Vivi Barrack, DPM;  Location: [facility] OR;  Service: Podiatry;  Laterality: Left;   PERIPHERAL VASCULAR BALLOON ANGIOPLASTY Left 06/10/2022   Procedure: PERIPHERAL VASCULAR BALLOON ANGIOPLASTY;  Surgeon: Cephus Shelling, MD;  Location: [facility] INVASIVE CV LAB;  Service: Cardiovascular;  Laterality: Left;  AT and DP   PERIPHERAL VASCULAR INTERVENTION Left 06/10/2022   Procedure: PERIPHERAL VASCULAR INTERVENTION;  Surgeon: Cephus Shelling, MD;  Location: [facility] INVASIVE CV LAB;  Service: Cardiovascular;  Laterality: Left;  SFA   RADIOLOGY WITH ANESTHESIA N/A 01/08/2022   Procedure: IR WITH ANESTHESIA;  Surgeon: Radiologist, Medication, MD;  Location: [facility] OR;  Service: Radiology;  Laterality: N/A;   THROMBECTOMY BRACHIAL ARTERY Right 11/18/2021   Procedure: RIGHT BRACHIAL, RADIAL, & ULNAR ARTERY THROMBECTOMY.;  Surgeon: Cephus Shelling, MD;  Location: [facility] OR;  Service: Vascular;  Laterality: Right;   TUBAL LIGATION     Family History:  Family History  Problem Relation Age of Onset   Thyroid disease Mother    Hypertension Mother    Hyperlipidemia Mother    CAD Mother    CVA Mother    Hyperlipidemia Father    Hypertension Father    CAD  Father    Breast cancer Paternal Grandmother 30   Cancer Paternal Grandmother        lung   Family Psychiatric  History: None endorsed or reported Social History:  Social History   Substance and Sexual Activity  Alcohol Use Yes   Comment: occ     Social History   Substance and Sexual Activity  Drug Use No    Social History   Socioeconomic History   Marital status: Married    Spouse name: Harvel Quale   Number of children: 2   Years of education: 12   Highest education level: 12th grade  Occupational History    Comment: Full time   Occupation: CNA at [employer]    Comment: applying for disability  Tobacco Use   Smoking status: Former  Current packs/day: 0.00    Average packs/day: 1 pack/day for 15.0 years (15.0 ttl pk-yrs)    Types: Cigarettes    Start date: 01/2007    Quit date: 01/2022    Years since quitting: 1.1    Passive exposure: Past   Smokeless tobacco: Never  Vaping Use   Vaping status: Never Used  Substance and Sexual Activity   Alcohol use: Yes    Comment: occ   Drug use: No   Sexual activity: Yes    Birth control/protection: Surgical    Comment: tubal   Other Topics Concern   Not on file  Social History Narrative   Lives with husband and kids   Right handed   Drinks 9+ cups caffeine daily   Social Determinants of Health   Financial Resource Strain: Low Risk  (11/09/2022)   Overall Financial Resource Strain (CARDIA)    Difficulty of Paying Living Expenses: Not very hard  Food Insecurity: No Food Insecurity (11/16/2022)   Hunger Vital Sign    Worried About Running Out of Food in the Last Year: Never true    Ran Out of Food in the Last Year: Never true  Transportation Needs: No Transportation Needs (11/16/2022)   PRAPARE - Administrator, Civil Service (Medical): No    Lack of Transportation (Non-Medical): No  Physical Activity: Inactive (03/03/2022)   Exercise Vital Sign    Days of Exercise per Week: 0 days    Minutes of Exercise per  Session: 0 min  Stress: No Stress Concern Present (03/03/2022)   [employer] of Occupational Health - Occupational Stress Questionnaire    Feeling of Stress : Only a little  Recent Concern: Stress - Stress Concern Present (12/22/2021)   [employer] of Occupational Health - Occupational Stress Questionnaire    Feeling of Stress : To some extent  Social Connections: Socially Integrated (03/03/2022)   Social Connection and Isolation Panel [NHANES]    Frequency of Communication with Friends and Family: More than three times a week    Frequency of Social Gatherings with Friends and Family: More than three times a week    Attends Religious Services: More than 4 times per year    Active Member of [employer] or Organizations: Yes    Attends Engineer, structural: More than 4 times per year    Marital Status: Married   Additional Social History:    Allergies:   Allergies  Allergen Reactions   Visipaque [Iodixanol] Nausea And Vomiting    Patient had vagal response during procedure became hypertensive, and bradycardic. CO2 used during procedure prior to contrast being used, this may be cause as well. Recommended premedicating prior to contrast being used in the future.    Wellbutrin [Bupropion] Hives   Gabapentin     dizzy   Cefepime Rash    Tolerates penicilllin   Ciprofloxacin Hcl Hives and Rash    Hives/rash at injection site; 01/15/22 tolerated IV cipro   Tape Rash    Labs:  Results for orders placed or performed during the hospital encounter of 03/05/23 (from the past 48 hour(s))  Comprehensive metabolic panel     Status: Abnormal   Collection Time: 03/05/23  3:53 PM  Result Value Ref Range   Sodium 138 135 - 145 mmol/L   Potassium 3.8 3.5 - 5.1 mmol/L   Chloride 108 98 - 111 mmol/L   CO2 22 22 - 32 mmol/L   Glucose, Bld 307 (H) 70 - 99 mg/dL  Comment: Glucose reference range applies only to samples taken after fasting for at least 8 hours.   BUN 24 (H) 6 - 20  mg/dL   Creatinine, Ser 1.61 (H) 0.44 - 1.00 mg/dL   Calcium 8.7 (L) 8.9 - 10.3 mg/dL   Total Protein 7.0 6.5 - 8.1 g/dL   Albumin 3.4 (L) 3.5 - 5.0 g/dL   AST 22 15 - 41 U/L   ALT 26 0 - 44 U/L   Alkaline Phosphatase 59 38 - 126 U/L   Total Bilirubin 0.6 0.3 - 1.2 mg/dL   GFR, Estimated 21 (L) >60 mL/min    Comment: (NOTE) Calculated using the CKD-EPI Creatinine Equation (2021)    Anion gap 8 5 - 15    Comment: Performed at [facility], [address], [place], [place] [address]  Ethanol     Status: None   Collection Time: 03/05/23  3:53 PM  Result Value Ref Range   Alcohol, Ethyl (B) <10 <10 mg/dL    Comment: (NOTE) Lowest detectable limit for serum alcohol is 10 mg/dL.  For medical purposes only. Performed at [facility], [address], [place], [place] [address]   CBC with Diff     Status: None   Collection Time: 03/05/23  3:53 PM  Result Value Ref Range   WBC 9.4 4.0 - 10.5 K/uL    Comment: REPEATED TO VERIFY WHITE COUNT CONFIRMED ON SMEAR    RBC 4.42 3.87 - 5.11 MIL/uL   Hemoglobin 13.2 12.0 - 15.0 g/dL   HCT 11.9 14.7 - 82.9 %   MCV 91.2 80.0 - 100.0 fL   MCH 29.9 26.0 - 34.0 pg   MCHC 32.8 30.0 - 36.0 g/dL   RDW 56.2 13.0 - 86.5 %   Platelets 387 150 - 400 K/uL   nRBC 0.0 0.0 - 0.2 %    Comment: REPEATED TO VERIFY   Neutrophils Relative % 73 %   Neutro Abs 6.9 1.7 - 7.7 K/uL   Lymphocytes Relative 19 %   Lymphs Abs 1.8 0.7 - 4.0 K/uL   Monocytes Relative 6 %   Monocytes Absolute 0.6 0.1 - 1.0 K/uL   Eosinophils Relative 1 %   Eosinophils Absolute 0.1 0.0 - 0.5 K/uL   Basophils Relative 1 %   Basophils Absolute 0.1 0.0 - 0.1 K/uL   WBC Morphology VACUOLATED NEUTROPHILS     Comment: WHITE CELLS CONFIRMED BY SMEAR   RBC Morphology MORPHOLOGY UNREMARKABLE    Smear Review MORPHOLOGY UNREMARKABLE    Immature Granulocytes 0 %   Abs Immature Granulocytes 0.03 0.00 - 0.07 K/uL    Comment: Performed at [facility], [address] [place], [place] [address]   Protime-INR     Status: Abnormal   Collection Time: 03/05/23  3:53 PM  Result Value Ref Range   Prothrombin Time 27.8 (H) 11.4 - 15.2 seconds   INR 2.6 (H) 0.8 - 1.2    Comment: (NOTE) INR goal varies based on device and disease states. Performed at [facility], [address] [place], [place] [address]   Urine rapid drug screen (hosp performed)     Status: None   Collection Time: 03/05/23  4:41 PM  Result Value Ref Range   Opiates NONE DETECTED NONE DETECTED   Cocaine NONE DETECTED NONE DETECTED   Benzodiazepines NONE DETECTED NONE DETECTED   Amphetamines NONE DETECTED NONE DETECTED   Tetrahydrocannabinol NONE DETECTED NONE DETECTED   Barbiturates NONE DETECTED NONE DETECTED    Comment: (NOTE)  DRUG SCREEN FOR MEDICAL PURPOSES ONLY.  IF CONFIRMATION IS NEEDED FOR ANY PURPOSE, NOTIFY LAB WITHIN 5 DAYS.  LOWEST DETECTABLE LIMITS FOR URINE DRUG SCREEN Drug Class                     Cutoff (ng/mL) Amphetamine and metabolites    1000 Barbiturate and metabolites    200 Benzodiazepine                 200 Opiates and metabolites        300 Cocaine and metabolites        300 THC                            50 Performed at [facility], [address] [place], [place] [address]   Urinalysis, Routine w reflex microscopic -Urine, Clean Catch     Status: Abnormal   Collection Time: 03/05/23  4:41 PM  Result Value Ref Range   Color, Urine AMBER (A) YELLOW    Comment: BIOCHEMICALS MAY BE AFFECTED BY COLOR   APPearance CLOUDY (A) CLEAR   Specific Gravity, Urine 1.017 1.005 - 1.030   pH 5.0 5.0 - 8.0   Glucose, UA 150 (A) NEGATIVE mg/dL   Hgb urine dipstick SMALL (A) NEGATIVE   Bilirubin Urine NEGATIVE NEGATIVE   Ketones, ur NEGATIVE NEGATIVE mg/dL   Protein, ur >=604 (A) NEGATIVE mg/dL   Nitrite NEGATIVE NEGATIVE   Leukocytes,Ua SMALL (A) NEGATIVE   RBC / HPF 0-5 0 - 5 RBC/hpf   WBC, UA 11-20 0 - 5 WBC/hpf   Bacteria, UA MANY (A) NONE SEEN   Squamous Epithelial / HPF 0-5 0 - 5  /HPF   WBC Clumps PRESENT    Mucus PRESENT    Hyaline Casts, UA PRESENT     Comment: Performed at [facility], [address] [place], [place] [address]  Pregnancy, urine     Status: None   Collection Time: 03/05/23  4:41 PM  Result Value Ref Range   Preg Test, Ur NEGATIVE NEGATIVE    Comment:        THE SENSITIVITY OF THIS METHODOLOGY IS >25 mIU/mL. Performed at [facility], [address], [place], [place] [address]   CBG monitoring, ED     Status: Abnormal   Collection Time: 03/05/23  6:47 PM  Result Value Ref Range   Glucose-Capillary 243 (H) 70 - 99 mg/dL    Comment: Glucose reference range applies only to samples taken after fasting for at least 8 hours.  POC CBG, ED     Status: Abnormal   Collection Time: 03/05/23 11:15 PM  Result Value Ref Range   Glucose-Capillary 514 (HH) 70 - 99 mg/dL    Comment: Glucose reference range applies only to samples taken after fasting for at least 8 hours.   Comment 1 Notify RN   CBG monitoring, ED     Status: Abnormal   Collection Time: 03/06/23 12:48 AM  Result Value Ref Range   Glucose-Capillary 522 (HH) 70 - 99 mg/dL    Comment: Glucose reference range applies only to samples taken after fasting for at least 8 hours.   Comment 1 Notify RN   POC CBG, ED     Status: Abnormal   Collection Time: 03/06/23  1:33 AM  Result Value Ref Range   Glucose-Capillary 443 (H) 70 - 99 mg/dL    Comment: Glucose reference range applies only to  samples taken after fasting for at least 8 hours.  CBG monitoring, ED     Status: Abnormal   Collection Time: 03/06/23  3:04 AM  Result Value Ref Range   Glucose-Capillary 332 (H) 70 - 99 mg/dL    Comment: Glucose reference range applies only to samples taken after fasting for at least 8 hours.  CBG monitoring, ED     Status: Abnormal   Collection Time: 03/06/23  5:51 AM  Result Value Ref Range   Glucose-Capillary 133 (H) 70 - 99 mg/dL    Comment: Glucose reference range applies only to samples taken after  fasting for at least 8 hours.  POC CBG, ED     Status: Abnormal   Collection Time: 03/06/23  7:35 AM  Result Value Ref Range   Glucose-Capillary 33 (LL) 70 - 99 mg/dL    Comment: Glucose reference range applies only to samples taken after fasting for at least 8 hours.   Comment 1 Notify RN    Comment 2 Document in Chart   CBG monitoring, ED     Status: Abnormal   Collection Time: 03/06/23  8:04 AM  Result Value Ref Range   Glucose-Capillary 130 (H) 70 - 99 mg/dL    Comment: Glucose reference range applies only to samples taken after fasting for at least 8 hours.  CBG monitoring, ED     Status: Abnormal   Collection Time: 03/06/23 10:03 AM  Result Value Ref Range   Glucose-Capillary 235 (H) 70 - 99 mg/dL    Comment: Glucose reference range applies only to samples taken after fasting for at least 8 hours.  CBG monitoring, ED     Status: Abnormal   Collection Time: 03/06/23 12:19 PM  Result Value Ref Range   Glucose-Capillary 338 (H) 70 - 99 mg/dL    Comment: Glucose reference range applies only to samples taken after fasting for at least 8 hours.    Medications:  Current Facility-Administered Medications  Medication Dose Route Frequency Provider Last Rate Last Admin   hydrOXYzine (ATARAX) tablet 25 mg  25 mg Oral TID PRN Adria Dill, MD       insulin aspart (novoLOG) injection 0-15 Units  0-15 Units Subcutaneous TID WC Durwin Glaze, MD   11 Units at 03/06/23 1225   QUEtiapine (SEROQUEL) tablet 50 mg  50 mg Oral QHS Feller, Cloretta Ned, MD       QUEtiapine (SEROQUEL) tablet 50 mg  50 mg Oral TID PRN Adria Dill, MD       traZODone (DESYREL) tablet 100 mg  100 mg Oral QHS Adria Dill, MD       Current Outpatient Medications  Medication Sig Dispense Refill   acetaminophen (TYLENOL) 325 MG tablet Take 1-2 tablets (325-650 mg total) by mouth every 4 (four) hours as needed for mild pain. (Patient taking differently: Take 650 mg by mouth as needed for mild pain (pain score  1-3).) 100 tablet 0   atorvastatin (LIPITOR) 40 MG tablet TAKE 1 TABLET BY MOUTH ONCE DAILY. 90 tablet 1   clopidogrel (PLAVIX) 75 MG tablet Take 1 tablet (75 mg total) by mouth daily. 30 tablet 11   furosemide (LASIX) 40 MG tablet Take 40 mg daily as needed for swelling (Patient taking differently: Take 20 mg by mouth daily as needed for edema or fluid.) 90 tablet 3   hydrALAZINE (APRESOLINE) 25 MG tablet Take 1 tablet (25 mg total) by mouth every 8 (eight) hours. (Patient taking differently: Take  25 mg by mouth in the morning and at bedtime.) 90 tablet 1   ibuprofen (ADVIL) 200 MG tablet Take 200 mg by mouth every 6 (six) hours as needed for mild pain (pain score 1-3).     insulin aspart (NOVOLOG) 100 UNIT/ML injection Use with Omnipod for daily dose around 100 units daily (Patient taking differently: Inject 0-100 Units into the skin See admin instructions. Use with Omnipod) 80 mL 3   isosorbide mononitrate (IMDUR) 60 MG 24 hr tablet TAKE 2 TABLETS BY MOUTH ONCE DAILY. 60 tablet 1   levothyroxine (SYNTHROID) 125 MCG tablet Take 1 tablet (125 mcg total) by mouth daily at 6 (six) AM. 90 tablet 3   metoCLOPramide (REGLAN) 10 MG tablet Take 10 mg by mouth 4 (four) times daily.     metoprolol succinate (TOPROL-XL) 50 MG 24 hr tablet Take 50 mg by mouth daily.     Multiple Vitamin (MULTIVITAMIN WITH MINERALS) TABS tablet Take 1 tablet by mouth daily.     nitroGLYCERIN (NITROSTAT) 0.4 MG SL tablet Place 1 tablet (0.4 mg total) under the tongue every 5 (five) minutes x 3 doses as needed for chest pain. 25 tablet 3   oxyCODONE-acetaminophen (PERCOCET) 7.5-325 MG tablet Take 1 tablet by mouth daily as needed for severe pain. 30 tablet 0   pantoprazole (PROTONIX) 40 MG tablet Take 1 tablet (40 mg total) by mouth daily. Courtesy fill/ pt to get established with a primary MD for further refills (Patient taking differently: Take 40 mg by mouth daily as needed (reflux).) 90 tablet 1   ranolazine (RANEXA) 1000 MG  SR tablet Take 1 tablet (1,000 mg total) by mouth 2 (two) times daily. 60 tablet 1   scopolamine (TRANSDERM-SCOP) 1 MG/3DAYS Place 1 patch (1.5 mg total) onto the skin every 3 (three) days. 10 patch 12   senna-docusate (SENOKOT-S) 8.6-50 MG tablet Take 1-2 tablets by mouth daily as needed for mild constipation. 30 tablet 0   tiZANidine (ZANAFLEX) 4 MG tablet Take 1 tablet (4 mg total) by mouth at bedtime. 30 tablet 1   topiramate (TOPAMAX) 25 MG tablet Take 0.5-1 tablets (12.5-25 mg total) by mouth at bedtime. (Patient taking differently: Take 12.5 mg by mouth at bedtime.) 30 tablet 2   traZODone (DESYREL) 150 MG tablet Take 1 tablet (150 mg total) by mouth at bedtime. 90 tablet 2   warfarin (COUMADIN) 5 MG tablet TAKE ONE TABLET BY MOUTH DAILY EXCEPT 1/2 TABLET ON WEDNESDAYS AND SATURDAYS OR AS DIRECTED 40 tablet 5   Continuous Glucose Sensor (DEXCOM G6 SENSOR) MISC CHANGE SENSOR EVERY 10 DAYS. 3 each 1   Continuous Glucose Transmitter (DEXCOM G6 TRANSMITTER) MISC Change transmitter every 90 days as directed. 1 each 3   Insulin Disposable Pump (OMNIPOD 5 G6 PODS, GEN 5,) MISC Change pod every 48-72 hours. 100 each 0    Musculoskeletal: Strength & Muscle Tone: decreased Gait & Station: normal Patient leans: N/A          Psychiatric Specialty Exam:  Presentation  General Appearance:  Appropriate for Environment  Eye Contact: Fair  Speech: Clear and Coherent; Normal Rate  Speech Volume: Normal  Handedness: Right   Mood and Affect  Mood: -- ("Little annoyed")  Affect: Other (comment) (Neutral if mildly constricted)   Thought Process  Thought Processes: Linear; Goal Directed; Coherent  Descriptions of Associations:Intact  Orientation:Full (Time, Place and Person)  Thought Content:Logical  History of Schizophrenia/Schizoaffective disorder:No data recorded Duration of Psychotic Symptoms:No data recorded Hallucinations:Hallucinations: None  Ideas of  Reference:None  Suicidal Thoughts:Suicidal Thoughts: No  Homicidal Thoughts:Homicidal Thoughts: No   Sensorium  Memory: Immediate Good; Recent Good; Remote Good  Judgment: Intact  Insight: Present   Executive Functions  Concentration: Good  Attention Span: Good  Recall: Good  Fund of Knowledge: Good  Language: Good   Psychomotor Activity  Psychomotor Activity: Psychomotor Activity: Normal   Assets  Assets: Communication Skills; Financial Resources/Insurance; Housing; Intimacy; Leisure Time; Resilience; Social Support; Talents/Skills; Transportation; Vocational/Educational   Sleep  Sleep: Sleep: Fair    Physical Exam: Physical Exam Vitals and nursing note reviewed.  Constitutional:      General: She is not in acute distress.    Appearance: Normal appearance. She is not ill-appearing, toxic-appearing or diaphoretic.  Pulmonary:     Effort: Pulmonary effort is normal.  Neurological:     Mental Status: She is alert and oriented to person, place, and time.  Psychiatric:        Attention and Perception: Attention and perception normal. She is attentive. She does not perceive auditory or visual hallucinations.        Speech: Speech normal.        Behavior: Behavior normal. Behavior is not agitated, slowed, aggressive, withdrawn, hyperactive or combative. Behavior is cooperative.        Thought Content: Thought content normal. Thought content is not paranoid or delusional. Thought content does not include homicidal or suicidal ideation.        Cognition and Memory: Cognition and memory normal.        Judgment: Judgment normal.    Review of Systems  Neurological:  Positive for weakness (D/t stroke).  Psychiatric/Behavioral:  Negative for depression, hallucinations, substance abuse and suicidal ideas. The patient is not nervous/anxious and does not have insomnia.   All other systems reviewed and are negative.  Blood pressure 120/89, pulse 92,  temperature 98.1 F (36.7 C), temperature source Oral, resp. rate 19, height 5\' 5"  (1.651 m), weight 101.6 kg, SpO2 99%. Body mass index is 37.27 kg/m.  Treatment Plan Summary:  Patient presented this encounter by way of family due to concerns for a recent increase in poor judgment, impulsivity, and hallucinations since the patient had a stroke one year ago.    Upon evaluation, patient appreciably through extensive evaluation has mental capacity, she does not present with psychotic features or hallucinations, she does not endorse decompensation of her mental health in the form of instability in her mood, nor does she endorse suicidal or homicidal ideations.   Given the findings today, as well as collateral conversation held with family and safety planning able to be conducted with family and patient, recommendation at this time is for psychiatric clearance, as well as the additional recommendations listed below.  Spoke with Dr. Jannifer Franklin who agrees with plan of care and recommendations listed below for discharge  Recommendations-psychiatrically cleared  #Behavior concern in adult #Episodes of formed visual hallucinations  -Recommend safety plan below -Recommend discharge with family -Recommend consider utilization of outpatient mental health resources if/when needed -Recommend consider psychopharmacological intervention for brief episodes of formed visual hallucinations if/when felt needed and amenable  Safety Plan Victoire Catano will reach out to Harvel Quale (Spouse), call 911 or call mobile crisis, or go to nearest emergency room if condition worsens.  Patients' will follow up with [facility] for outpatient psychiatric services (therapy/medication management) if/when felt needed. The suicide prevention education provided includes the following: Suicide risk factors Suicide prevention and interventions National Suicide Hotline telephone number [facility] assessment  telephone number [facility] Emergency Assistance [address] and/or Residential Mobile Crisis Unit telephone number Request made of family/significant other to:  Harvel Quale (Spouse) Remove weapons (e.g., guns, rifles, knives), all items previously/currently identified as safety concern.   Remove drugs/medications (over the counter, prescriptions, illicit drugs), all items previously/currently identified as a safety concern.    Disposition: No evidence of imminent risk to self or others at present.   Patient does not meet criteria for psychiatric inpatient admission. Supportive therapy provided about ongoing stressors. Discussed crisis plan, support from social network, calling 911, coming to the Emergency Department, and calling Suicide Hotline.  This service was provided via telemedicine using a 2-way, interactive audio and video technology.  Names of all persons participating in this telemedicine service and their role in this encounter. Name: Arsenio Loader, NP Role: Psychiatric consult  Name: Rhunette Croft, MD Role: EDP  Name: Cammy Copa, RN Role: Care Nurse  Name:  Role:     Lenox Ponds, NP 03/06/2023 2:00 PM

## 2023-03-06 NOTE — Consult Note (Signed)
[facility] Consult Note  Patient Name: Chonda Baney MRN: [id number] DOB: [birth year] DATE OF Consult: 03/06/2023  PRIMARY PSYCHIATRIC DIAGNOSES  1.  Unspecified mood disorder 2.  Unspecified psychosis  3.  Rule out mood disorder and psychosis secondary to general medical condition   RECOMMENDATIONS  Recommendations: Medication recommendations: Continue trazodone 100mg  po nightly; Recommend Seroquel 50mg  po nightly for mood/psychosis; Recommend hydroxyzine 25mg  po TID PRN anxiety; Recommend Seroquel 50mg  po TID PRN agitation  Non-Medication/therapeutic recommendations: Psychiatric hospitalization with ongoing re-evaluation for need for admission and consideration of social work referral to discuss alternative placement Is inpatient psychiatric hospitalization recommended for this patient? Yes (Explain why): Currently patient's husband unable to look after her due to his concern patient is putting herself and their family in danger due to sharing her bank information, home address, and making plans to potentially leave the state with strangers Follow-Up Telepsychiatry C/L services: We will continue to follow this patient with you until stabilized or discharged.  If you have any questions or concerns, please call our TeleCare Coordination service at  [phone] and ask for myself or the provider on-call. Communication: Treatment team members (and family members if applicable) who were involved in treatment/care discussions and planning, and with whom we spoke or engaged with via secure text/chat, include the following: Dr. Georgina Snell  Thank you for involving [name] in the care of this patient. If you have any additional questions or concerns, please call [phone] and ask for me or the provider on-call.  TELEPSYCHIATRY ATTESTATION & CONSENT  As the provider for this telehealth consult, I attest that I verified the patient's identity using two separate identifiers, introduced myself to the  patient, provided my credentials, disclosed my location, and performed this encounter via a HIPAA-compliant, real-time, face-to-face, two-way, interactive audio and video platform and with the full consent and agreement of the patient (or guardian as applicable.)  Patient physical location: ED in [facility]  Telehealth provider physical location: home office in state of [place]   Video start time: 0110 AM EST Video end time: 0125 AM EST   IDENTIFYING DATA  Kyndell Zeiser is a 42 y.o. year-old female for whom a psychiatric consultation has been ordered by the primary provider. The patient was identified using two separate identifiers.  CHIEF COMPLAINT/REASON FOR CONSULT  Auditory hallucinations and increased goal directed activities     HISTORY OF PRESENT ILLNESS (HPI)  Amarra Jacot is a 42 year old female with a history of depression, bipolar disorder, CAD, CHF, CKD, R MCA CVA, DM1, MI brought to the ED by her husband due to concern for worsening visual hallucinations that began post stroke and patient reportedly exhibiting poor judgement. Patient placed on IVC in ED. Chart reviewed, UDS and ethanol negative, UA unremarkable. MRI brain with no acute findings, remote left occipital infarct and "Extensive remote infarcts throughout the right cerebral hemisphere primarily in the MCA distribution, and small remote left occipital infarct." Psychiatry consulted for diagnostic clarification and disposition recommendations.  On evaluation, patient noted to be calm, cooperative, dysthymic, linear, not appearing internally preoccupied, not responding to internal stimuli, with notably poor insight, alert and oriented x 4. Patient reports her husband brought her to the ED to "get checked out to make sure I am okay. He was concerned that I was not making healthy decisions." Patient reports she was going to "uproot my life." States, "I just wanted to try something different." She states she was  thinking of "being with someone other than my husband  and living my life a little different." Patient states, "those things aren't that abnormal for me." She states "this isn't the first time I've gotten to know somebody and liked tham and thought about throwing caution to the wind." She reports she met someone online who lives in a different state and she was thinking of moving there to be with him. States this person was coming to get her "and then we were leaving." Denies that she wants to leave with this person." Reports this has happened before a few years ago when she took a job in another state and wanted to uproot her life. Patient states she often thinks she wants to uproot her life and is unable to show insight into the circumstances leading to ED presentation stating, "I don't need to be here." Patient states sometimes "I think I see people in the vehicle" and then they are not there. States when she is then told that the person is not there she believes them. She states, "sometimes I have to slow down." Patient denies auditory hallucinations. Patient reports she has been "a little down in the dumps." Patient reports she was first depressed when she was a teenager.   Spoke to patient's husband ([phone]). He states since her stroke, "she has certain little issues." Reports at times she has visual hallucinations and thinks people are there who are not. States 2 nights ago she stayed with her mom. He picked her up yesterday and brought her home. Reports patient has been talking to people online who are scamming her into thinking she is talking to celebrities. Reports this is going on for about a month. States the first time her son saw her phone and saw she was talking to these people. He reports she sent one of them money, has given their address, and took picture of her bank cards and sent them to some of these people. States yesterday she told him that one of these men was coming to get her and  they were moving to [place]. He tried to talk to her and tell her that nobody was coming to get her and it wasn't real. States patient packed a small bag and her pill box. States she was trying to leave the house and saying she didn't want to be with him and wanted to be with this other man. Patient then told him that her best friend was in the parking lot but her friend was not there. States their 13 year old son tried to talk to her and she was unmoved. States this started when she was in the hospital post-stroke. He states that prior to stroke he never noted any concerns like this. Prior to the stroke she was in school getting her bachelor's degree and was working. He reports patient has been sleeping at night. Denies that patient has ever endorsed visual hallucinations. He has concerns about her safety were she to return home.     PAST PSYCHIATRIC HISTORY  Current psych meds: trazodone  Prior psych meds: Wellbutrin per chart review, does not recall others  Outpatient: Denies currently  Inpatient: As a teenager, once  Non-suicidal self injury: Denies Suicide attempts: Overdose; tried to cut her wrists - as a teenager Violence: Denies Drugs/alcohol: Denies Otherwise as per HPI above.  PAST MEDICAL HISTORY  Past Medical History:  Diagnosis Date   Anemia    CAD (coronary artery disease)    a. s/p cath in 03/2014 showing 30% mid-LAD, moderate to severe disease along small  D1, patent LCx, moderate to severe distal OM2 stenosis and moderate diffuse diease along RCA not amenable to PCI   CHF (congestive heart failure) (HCC)    a. EF 55-60% in 12/2019 b. EF at 35-40% by echo in 05/2020   CKD (chronic kidney disease) stage 4, GFR 15-29 ml/min (HCC)    Diabetes mellitus without complication (HCC)    Myocardial infarction (HCC)    Neuropathy    PVD (peripheral vascular disease) (HCC)    Stroke (HCC) 01/2022   L hand weakness     HOME MEDICATIONS  Facility Ordered Medications  Medication    [COMPLETED] fosfomycin (MONUROL) packet 3 g   insulin aspart (novoLOG) injection 0-15 Units   [COMPLETED] LORazepam (ATIVAN) tablet 1 mg   [COMPLETED] insulin aspart (novoLOG) injection 15 Units   [COMPLETED] insulin aspart (novoLOG) injection 15 Units   PTA Medications  Medication Sig   acetaminophen (TYLENOL) 325 MG tablet Take 1-2 tablets (325-650 mg total) by mouth every 4 (four) hours as needed for mild pain. (Patient taking differently: Take 650 mg by mouth as needed for mild pain (pain score 1-3).)   Multiple Vitamin (MULTIVITAMIN WITH MINERALS) TABS tablet Take 1 tablet by mouth daily.   nitroGLYCERIN (NITROSTAT) 0.4 MG SL tablet Place 1 tablet (0.4 mg total) under the tongue every 5 (five) minutes x 3 doses as needed for chest pain.   furosemide (LASIX) 40 MG tablet Take 40 mg daily as needed for swelling (Patient taking differently: Take 20 mg by mouth daily as needed for edema or fluid.)   clopidogrel (PLAVIX) 75 MG tablet Take 1 tablet (75 mg total) by mouth daily.   pantoprazole (PROTONIX) 40 MG tablet Take 1 tablet (40 mg total) by mouth daily. Courtesy fill/ pt to get established with a primary MD for further refills (Patient taking differently: Take 40 mg by mouth daily as needed (reflux).)   levothyroxine (SYNTHROID) 125 MCG tablet Take 1 tablet (125 mcg total) by mouth daily at 6 (six) AM.   senna-docusate (SENOKOT-S) 8.6-50 MG tablet Take 1-2 tablets by mouth daily as needed for mild constipation.   insulin aspart (NOVOLOG) 100 UNIT/ML injection Use with Omnipod for daily dose around 100 units daily (Patient taking differently: Inject 0-100 Units into the skin See admin instructions. Use with Omnipod)   hydrALAZINE (APRESOLINE) 25 MG tablet Take 1 tablet (25 mg total) by mouth every 8 (eight) hours. (Patient taking differently: Take 25 mg by mouth in the morning and at bedtime.)   topiramate (TOPAMAX) 25 MG tablet Take 0.5-1 tablets (12.5-25 mg total) by mouth at bedtime.  (Patient taking differently: Take 12.5 mg by mouth at bedtime.)   ranolazine (RANEXA) 1000 MG SR tablet Take 1 tablet (1,000 mg total) by mouth 2 (two) times daily.   tiZANidine (ZANAFLEX) 4 MG tablet Take 1 tablet (4 mg total) by mouth at bedtime.   atorvastatin (LIPITOR) 40 MG tablet TAKE 1 TABLET BY MOUTH ONCE DAILY.   traZODone (DESYREL) 150 MG tablet Take 1 tablet (150 mg total) by mouth at bedtime.   oxyCODONE-acetaminophen (PERCOCET) 7.5-325 MG tablet Take 1 tablet by mouth daily as needed for severe pain.   warfarin (COUMADIN) 5 MG tablet TAKE ONE TABLET BY MOUTH DAILY EXCEPT 1/2 TABLET ON WEDNESDAYS AND SATURDAYS OR AS DIRECTED   scopolamine (TRANSDERM-SCOP) 1 MG/3DAYS Place 1 patch (1.5 mg total) onto the skin every 3 (three) days.   isosorbide mononitrate (IMDUR) 60 MG 24 hr tablet TAKE 2 TABLETS BY MOUTH ONCE  DAILY.   metoCLOPramide (REGLAN) 10 MG tablet Take 10 mg by mouth 4 (four) times daily.   metoprolol succinate (TOPROL-XL) 50 MG 24 hr tablet Take 50 mg by mouth daily.   Insulin Disposable Pump (OMNIPOD 5 G6 PODS, GEN 5,) MISC Change pod every 48-72 hours.   Continuous Glucose Sensor (DEXCOM G6 SENSOR) MISC CHANGE SENSOR EVERY 10 DAYS.   Continuous Glucose Transmitter (DEXCOM G6 TRANSMITTER) MISC Change transmitter every 90 days as directed.     ALLERGIES  Allergies  Allergen Reactions   Visipaque [Iodixanol] Nausea And Vomiting    Patient had vagal response during procedure became hypertensive, and bradycardic. CO2 used during procedure prior to contrast being used, this may be cause as well. Recommended premedicating prior to contrast being used in the future.    Wellbutrin [Bupropion] Hives   Gabapentin     dizzy   Cefepime Rash    Tolerates penicilllin   Ciprofloxacin Hcl Hives and Rash    Hives/rash at injection site; 01/15/22 tolerated IV cipro   Tape Rash    SOCIAL & SUBSTANCE USE HISTORY  Social History   Socioeconomic History   Marital status: Married     Spouse name: Harvel Quale   Number of children: 2   Years of education: 12   Highest education level: 12th grade  Occupational History    Comment: Full time   Occupation: CNA at [employer]    Comment: applying for disability  Tobacco Use   Smoking status: Former    Current packs/day: 0.00    Average packs/day: 1 pack/day for 15.0 years (15.0 ttl pk-yrs)    Types: Cigarettes    Start date: 01/2007    Quit date: 01/2022    Years since quitting: 1.1    Passive exposure: Past   Smokeless tobacco: Never  Vaping Use   Vaping status: Never Used  Substance and Sexual Activity   Alcohol use: Yes    Comment: occ   Drug use: No   Sexual activity: Yes    Birth control/protection: Surgical    Comment: tubal   Other Topics Concern   Not on file  Social History Narrative   Lives with husband and kids   Right handed   Drinks 9+ cups caffeine daily   Social Determinants of Health   Financial Resource Strain: Low Risk  (11/09/2022)   Overall Financial Resource Strain (CARDIA)    Difficulty of Paying Living Expenses: Not very hard  Food Insecurity: No Food Insecurity (11/16/2022)   Hunger Vital Sign    Worried About Running Out of Food in the Last Year: Never true    Ran Out of Food in the Last Year: Never true  Transportation Needs: No Transportation Needs (11/16/2022)   PRAPARE - Administrator, Civil Service (Medical): No    Lack of Transportation (Non-Medical): No  Physical Activity: Inactive (03/03/2022)   Exercise Vital Sign    Days of Exercise per Week: 0 days    Minutes of Exercise per Session: 0 min  Stress: No Stress Concern Present (03/03/2022)   [employer] of Occupational Health - Occupational Stress Questionnaire    Feeling of Stress : Only a little  Recent Concern: Stress - Stress Concern Present (12/22/2021)   [employer] of Occupational Health - Occupational Stress Questionnaire    Feeling of Stress : To some extent  Social Connections: Socially  Integrated (03/03/2022)   Social Connection and Isolation Panel [NHANES]    Frequency of Communication with Friends  and Family: More than three times a week    Frequency of Social Gatherings with Friends and Family: More than three times a week    Attends Religious Services: More than 4 times per year    Active Member of [employer] or Organizations: Yes    Attends Engineer, structural: More than 4 times per year    Marital Status: Married   Social History   Tobacco Use  Smoking Status Former   Current packs/day: 0.00   Average packs/day: 1 pack/day for 15.0 years (15.0 ttl pk-yrs)   Types: Cigarettes   Start date: 01/2007   Quit date: 01/2022   Years since quitting: 1.1   Passive exposure: Past  Smokeless Tobacco Never   Social History   Substance and Sexual Activity  Alcohol Use Yes   Comment: occ   Social History   Substance and Sexual Activity  Drug Use No    Additional pertinent information Denies    FAMILY HISTORY  Family History  Problem Relation Age of Onset   Thyroid disease Mother    Hypertension Mother    Hyperlipidemia Mother    CAD Mother    CVA Mother    Hyperlipidemia Father    Hypertension Father    CAD Father    Breast cancer Paternal Grandmother 94   Cancer Paternal Grandmother        lung   Family Psychiatric History (if known):  Mother: depression; suicide attempts; maternal grandmother: schizophrenia    MENTAL STATUS EXAM (MSE)  Presentation  General Appearance:  Appropriate for Environment  Eye Contact: Good  Speech: Clear and Coherent  Speech Volume: Normal   Mood and Affect  Mood: Dysthymic   Affect: Appropriate   Thought Process  Thought Processes: Coherent  Descriptions of Associations: Intact  Orientation: Full (Time, Place and Person)  Thought Content: Computation  History of Schizophrenia/Schizoaffective disorder: None  Hallucinations: Visual hallucinations   Ideas of  Reference: None  Suicidal Thoughts: Suicidal Thoughts: No  Homicidal Thoughts: Homicidal Thoughts: No   Sensorium  Memory: Immediate Good; Recent Good; Remote Good  Judgment: Poor   Insight: Poor   Executive Functions  Concentration: Fair  Attention Span: Good  Recall: Good  Fund of Knowledge: Good  Language: Good   Psychomotor Activity  Psychomotor Activity: Psychomotor Activity: Normal   Assets  Assets: Communication Skills    VITALS  Blood pressure (!) 137/91, pulse 96, temperature 98 F (36.7 C), resp. rate 16, height 5\' 5"  (1.651 m), weight 101.6 kg, SpO2 94%.  LABS  Admission on 03/05/2023  Component Date Value Ref Range Status   Sodium 03/05/2023 138  135 - 145 mmol/L Final   Potassium 03/05/2023 3.8  3.5 - 5.1 mmol/L Final   Chloride 03/05/2023 108  98 - 111 mmol/L Final   CO2 03/05/2023 22  22 - 32 mmol/L Final   Glucose, Bld 03/05/2023 307 (H)  70 - 99 mg/dL Final   Glucose reference range applies only to samples taken after fasting for at least 8 hours.   BUN 03/05/2023 24 (H)  6 - 20 mg/dL Final   Creatinine, Ser 03/05/2023 2.83 (H)  0.44 - 1.00 mg/dL Final   Calcium 54/01/8118 8.7 (L)  8.9 - 10.3 mg/dL Final   Total Protein 14/78/2956 7.0  6.5 - 8.1 g/dL Final   Albumin 21/30/8657 3.4 (L)  3.5 - 5.0 g/dL Final   AST 84/69/6295 22  15 - 41 U/L Final   ALT 03/05/2023 26  0 -  44 U/L Final   Alkaline Phosphatase 03/05/2023 59  38 - 126 U/L Final   Total Bilirubin 03/05/2023 0.6  0.3 - 1.2 mg/dL Final   GFR, Estimated 03/05/2023 21 (L)  >60 mL/min Final   Comment: (NOTE) Calculated using the CKD-EPI Creatinine Equation (2021)    Anion gap 03/05/2023 8  5 - 15 Final   Performed at [facility], [address] [place], [place] [address]   Alcohol, Ethyl (B) 03/05/2023 <10  <10 mg/dL Final   Comment: (NOTE) Lowest detectable limit for serum alcohol is 10 mg/dL.  For medical purposes only. Performed at [facility], [address], [place], [place] [address]    Opiates 03/05/2023 NONE DETECTED  NONE DETECTED Final   Cocaine 03/05/2023 NONE DETECTED  NONE DETECTED Final   Benzodiazepines 03/05/2023 NONE DETECTED  NONE DETECTED Final   Amphetamines 03/05/2023 NONE DETECTED  NONE DETECTED Final   Tetrahydrocannabinol 03/05/2023 NONE DETECTED  NONE DETECTED Final   Barbiturates 03/05/2023 NONE DETECTED  NONE DETECTED Final   Comment: (NOTE) DRUG SCREEN FOR MEDICAL PURPOSES ONLY.  IF CONFIRMATION IS NEEDED FOR ANY PURPOSE, NOTIFY LAB WITHIN 5 DAYS.  LOWEST DETECTABLE LIMITS FOR URINE DRUG SCREEN Drug Class                     Cutoff (ng/mL) Amphetamine and metabolites    1000 Barbiturate and metabolites    200 Benzodiazepine                 200 Opiates and metabolites        300 Cocaine and metabolites        300 THC                            50 Performed at [facility], [address], [place], [place] [address]    WBC 03/05/2023 9.4  4.0 - 10.5 K/uL Final   Comment: REPEATED TO VERIFY WHITE COUNT CONFIRMED ON SMEAR    RBC 03/05/2023 4.42  3.87 - 5.11 MIL/uL Final   Hemoglobin 03/05/2023 13.2  12.0 - 15.0 g/dL Final   HCT 18/84/1660 40.3  36.0 - 46.0 % Final   MCV 03/05/2023 91.2  80.0 - 100.0 fL Final   MCH 03/05/2023 29.9  26.0 - 34.0 pg Final   MCHC 03/05/2023 32.8  30.0 - 36.0 g/dL Final   RDW 63/05/6008 13.6  11.5 - 15.5 % Final   Platelets 03/05/2023 387  150 - 400 K/uL Final   nRBC 03/05/2023 0.0  0.0 - 0.2 % Final   REPEATED TO VERIFY   Neutrophils Relative % 03/05/2023 73  % Final   Neutro Abs 03/05/2023 6.9  1.7 - 7.7 K/uL Final   Lymphocytes Relative 03/05/2023 19  % Final   Lymphs Abs 03/05/2023 1.8  0.7 - 4.0 K/uL Final   Monocytes Relative 03/05/2023 6  % Final   Monocytes Absolute 03/05/2023 0.6  0.1 - 1.0 K/uL Final   Eosinophils Relative 03/05/2023 1  % Final   Eosinophils Absolute 03/05/2023 0.1  0.0 - 0.5 K/uL Final   Basophils Relative 03/05/2023 1  % Final   Basophils Absolute  03/05/2023 0.1  0.0 - 0.1 K/uL Final   WBC Morphology 03/05/2023 VACUOLATED NEUTROPHILS   Final   WHITE CELLS CONFIRMED BY SMEAR   RBC Morphology 03/05/2023 MORPHOLOGY UNREMARKABLE   Final   Smear Review 03/05/2023 MORPHOLOGY UNREMARKABLE   Final   Immature  Granulocytes 03/05/2023 0  % Final   Abs Immature Granulocytes 03/05/2023 0.03  0.00 - 0.07 K/uL Final   Performed at [facility], [address] [place], [place] [address]   Color, Urine 03/05/2023 AMBER (A)  YELLOW Final   BIOCHEMICALS MAY BE AFFECTED BY COLOR   APPearance 03/05/2023 CLOUDY (A)  CLEAR Final   Specific Gravity, Urine 03/05/2023 1.017  1.005 - 1.030 Final   pH 03/05/2023 5.0  5.0 - 8.0 Final   Glucose, UA 03/05/2023 150 (A)  NEGATIVE mg/dL Final   Hgb urine dipstick 03/05/2023 SMALL (A)  NEGATIVE Final   Bilirubin Urine 03/05/2023 NEGATIVE  NEGATIVE Final   Ketones, ur 03/05/2023 NEGATIVE  NEGATIVE mg/dL Final   Protein, ur 46/96/2952 >=300 (A)  NEGATIVE mg/dL Final   Nitrite 84/13/2440 NEGATIVE  NEGATIVE Final   Leukocytes,Ua 03/05/2023 SMALL (A)  NEGATIVE Final   RBC / HPF 03/05/2023 0-5  0 - 5 RBC/hpf Final   WBC, UA 03/05/2023 11-20  0 - 5 WBC/hpf Final   Bacteria, UA 03/05/2023 MANY (A)  NONE SEEN Final   Squamous Epithelial / HPF 03/05/2023 0-5  0 - 5 /HPF Final   WBC Clumps 03/05/2023 PRESENT   Final   Mucus 03/05/2023 PRESENT   Final   Hyaline Casts, UA 03/05/2023 PRESENT   Final   Performed at [facility], [address], [place], [place] [address]   Prothrombin Time 03/05/2023 27.8 (H)  11.4 - 15.2 seconds Final   INR 03/05/2023 2.6 (H)  0.8 - 1.2 Final   Comment: (NOTE) INR goal varies based on device and disease states. Performed at [facility], [address], [place], [place] [address]    Preg Test, Ur 03/05/2023 NEGATIVE  NEGATIVE Final   Comment:        THE SENSITIVITY OF THIS METHODOLOGY IS >25 mIU/mL. Performed at [facility], [address], [place], [place] [address]     Glucose-Capillary 03/05/2023 243 (H)  70 - 99 mg/dL Final   Glucose reference range applies only to samples taken after fasting for at least 8 hours.   Glucose-Capillary 03/05/2023 514 (HH)  70 - 99 mg/dL Final   Glucose reference range applies only to samples taken after fasting for at least 8 hours.   Comment 1 03/05/2023 Notify RN   Final   Glucose-Capillary 03/06/2023 522 (HH)  70 - 99 mg/dL Final   Glucose reference range applies only to samples taken after fasting for at least 8 hours.   Comment 1 03/06/2023 Notify RN   Final   Glucose-Capillary 03/06/2023 443 (H)  70 - 99 mg/dL Final   Glucose reference range applies only to samples taken after fasting for at least 8 hours.    PSYCHIATRIC REVIEW OF SYSTEMS (ROS)  ROS: Notable for the following relevant positive findings: Review of Systems  Psychiatric/Behavioral:  Positive for depression and hallucinations.     Additional findings:      Musculoskeletal: No abnormal movements observed      Gait & Station: Laying/Sitting      Pain Screening: Denies      Nutrition & Dental Concerns: None  RISK FORMULATION/ASSESSMENT  Is the patient experiencing any suicidal or homicidal ideations: No        Protective factors considered for safety management: Family, social support, future oriented   Risk factors/concerns considered for safety management:  Prior attempt Depression Physical illness/chronic pain Impulsivity  Is there a safety management plan with the patient and treatment team to minimize  risk factors and promote protective factors: Yes           Explain: -Psychiatric hospitalization with ongoing re-evaluation for need for admission and consideration of social work referral to discuss alternative placement Is crisis care placement or psychiatric hospitalization recommended: Yes     Based on my current evaluation and risk assessment, patient is determined at this time to be at:  High risk  *RISK ASSESSMENT Risk assessment is  a dynamic process; it is possible that this patient's condition, and risk level, may change. This should be re-evaluated and managed over time as appropriate. Please re-consult psychiatric consult services if additional assistance is needed in terms of risk assessment and management. If your team decides to discharge this patient, please advise the patient how to best access emergency psychiatric services, or to call 911, if their condition worsens or they feel unsafe in any way.  Elizeth Kazanjian is a 42 year old female with a history of depression, bipolar disorder, CAD, CHF, CKD, R MCA CVA, DM1, MI brought to the ED by her husband due to concern for worsening visual hallucinations that began post stroke and patient reportedly exhibiting poor judgement. Patient placed on IVC in ED. Chart reviewed, UDS and ethanol negative, UA unremarkable. MRI brain with no acute findings, remote left occipital infarct and "Extensive remote infarcts throughout the right cerebral hemisphere primarily in the MCA distribution, and small remote left occipital infarct." Psychiatry consulted for diagnostic clarification and disposition recommendations.On evaluation, patient noted to be calm, cooperative, dysthymic, linear, not appearing internally preoccupied, not responding to internal stimuli, with notably poor insight, alert and oriented x 4. Patient reports her husband was worried about her because she made plans to move to [place] with someone she met online and had packed her bags and was waiting to  be picked up. She seems unconcerned and unfazed by this and expresses she doesn't think it's a big deal. Also endorses intermittent visual hallucinations, thinking people are there who are not. Endorses depressed mood, denies suicidal ideation, intent, plan. Denies prolonged elevated/irritable mood. Patient's husband reports in the past month patient has been communicating with people he believes are scamming her. She has sent  photos of her bank card to them, money, and provided their home address. Reports yesterday she had a small bag packed and said she was being picked up to move to [place] with a man she met online. He reports the visual hallucinations started post-stroke but that he has never seen her be impulsive and exercise such poor judgment.  He has concerns about her safety were she to return home. The patient's presentation is concerning for unspecified mood disorder and unspecified psychosis rule out mood disorder and psychosis secondary to a general medical condition. Patient is putting herself in harms way and appears to have little to no insight and with increased goal directed activity. Diagnostically unclear if this is related to a primary psychiatric illness or sequelae from stroke but patient reports a bipolar disorder history and is currently high risk for harm to self and cannot safely be discharged. Recommend psychiatric hospitalization with ongoing re-evaluation for need for admission and consideration of social work referral to discuss alternative placement   Adria Dill, MD [facility]

## 2023-03-11 ENCOUNTER — Other Ambulatory Visit (HOSPITAL_COMMUNITY): Payer: Self-pay

## 2023-03-11 ENCOUNTER — Other Ambulatory Visit: Payer: Self-pay | Admitting: Nurse Practitioner

## 2023-03-13 ENCOUNTER — Encounter (HOSPITAL_COMMUNITY): Payer: Self-pay | Admitting: Emergency Medicine

## 2023-03-13 ENCOUNTER — Inpatient Hospital Stay (HOSPITAL_COMMUNITY)
Admission: EM | Admit: 2023-03-13 | Discharge: 2023-03-16 | DRG: 101 | Disposition: A | Discharge status: Home / Self Care | Payer: BC Managed Care – PPO | Attending: Family Medicine | Admitting: Family Medicine

## 2023-03-13 ENCOUNTER — Emergency Department (HOSPITAL_COMMUNITY): Payer: BC Managed Care – PPO

## 2023-03-13 ENCOUNTER — Other Ambulatory Visit: Payer: Self-pay

## 2023-03-13 DIAGNOSIS — Z91041 Radiographic dye allergy status: Secondary | ICD-10-CM | POA: Diagnosis not present

## 2023-03-13 DIAGNOSIS — Z823 Family history of stroke: Secondary | ICD-10-CM

## 2023-03-13 DIAGNOSIS — E039 Hypothyroidism, unspecified: Secondary | ICD-10-CM | POA: Diagnosis present

## 2023-03-13 DIAGNOSIS — I10 Essential (primary) hypertension: Secondary | ICD-10-CM | POA: Diagnosis not present

## 2023-03-13 DIAGNOSIS — I252 Old myocardial infarction: Secondary | ICD-10-CM | POA: Diagnosis not present

## 2023-03-13 DIAGNOSIS — Z881 Allergy status to other antibiotic agents status: Secondary | ICD-10-CM

## 2023-03-13 DIAGNOSIS — N179 Acute kidney failure, unspecified: Secondary | ICD-10-CM | POA: Diagnosis present

## 2023-03-13 DIAGNOSIS — E1022 Type 1 diabetes mellitus with diabetic chronic kidney disease: Secondary | ICD-10-CM | POA: Diagnosis present

## 2023-03-13 DIAGNOSIS — I130 Hypertensive heart and chronic kidney disease with heart failure and stage 1 through stage 4 chronic kidney disease, or unspecified chronic kidney disease: Secondary | ICD-10-CM | POA: Diagnosis present

## 2023-03-13 DIAGNOSIS — I69354 Hemiplegia and hemiparesis following cerebral infarction affecting left non-dominant side: Secondary | ICD-10-CM

## 2023-03-13 DIAGNOSIS — Z8249 Family history of ischemic heart disease and other diseases of the circulatory system: Secondary | ICD-10-CM

## 2023-03-13 DIAGNOSIS — R569 Unspecified convulsions: Secondary | ICD-10-CM | POA: Diagnosis present

## 2023-03-13 DIAGNOSIS — Z83438 Family history of other disorder of lipoprotein metabolism and other lipidemia: Secondary | ICD-10-CM

## 2023-03-13 DIAGNOSIS — I739 Peripheral vascular disease, unspecified: Secondary | ICD-10-CM | POA: Diagnosis present

## 2023-03-13 DIAGNOSIS — Z91048 Other nonmedicinal substance allergy status: Secondary | ICD-10-CM

## 2023-03-13 DIAGNOSIS — Z8349 Family history of other endocrine, nutritional and metabolic diseases: Secondary | ICD-10-CM

## 2023-03-13 DIAGNOSIS — Z888 Allergy status to other drugs, medicaments and biological substances status: Secondary | ICD-10-CM

## 2023-03-13 DIAGNOSIS — E1051 Type 1 diabetes mellitus with diabetic peripheral angiopathy without gangrene: Secondary | ICD-10-CM | POA: Diagnosis present

## 2023-03-13 DIAGNOSIS — S0011XA Contusion of right eyelid and periocular area, initial encounter: Secondary | ICD-10-CM | POA: Diagnosis present

## 2023-03-13 DIAGNOSIS — I2510 Atherosclerotic heart disease of native coronary artery without angina pectoris: Secondary | ICD-10-CM | POA: Diagnosis present

## 2023-03-13 DIAGNOSIS — Z7902 Long term (current) use of antithrombotics/antiplatelets: Secondary | ICD-10-CM

## 2023-03-13 DIAGNOSIS — Z7901 Long term (current) use of anticoagulants: Secondary | ICD-10-CM | POA: Diagnosis not present

## 2023-03-13 DIAGNOSIS — I5022 Chronic systolic (congestive) heart failure: Secondary | ICD-10-CM | POA: Diagnosis present

## 2023-03-13 DIAGNOSIS — E1100 Type 2 diabetes mellitus with hyperosmolarity without nonketotic hyperglycemic-hyperosmolar coma (NKHHC): Secondary | ICD-10-CM | POA: Diagnosis present

## 2023-03-13 DIAGNOSIS — N184 Chronic kidney disease, stage 4 (severe): Secondary | ICD-10-CM | POA: Diagnosis present

## 2023-03-13 DIAGNOSIS — N1832 Chronic kidney disease, stage 3b: Secondary | ICD-10-CM | POA: Diagnosis not present

## 2023-03-13 DIAGNOSIS — E1040 Type 1 diabetes mellitus with diabetic neuropathy, unspecified: Secondary | ICD-10-CM | POA: Diagnosis present

## 2023-03-13 DIAGNOSIS — Z87891 Personal history of nicotine dependence: Secondary | ICD-10-CM

## 2023-03-13 DIAGNOSIS — Z794 Long term (current) use of insulin: Secondary | ICD-10-CM

## 2023-03-13 DIAGNOSIS — Z91148 Patient's other noncompliance with medication regimen for other reason: Secondary | ICD-10-CM

## 2023-03-13 DIAGNOSIS — E876 Hypokalemia: Secondary | ICD-10-CM | POA: Diagnosis present

## 2023-03-13 DIAGNOSIS — W07XXXA Fall from chair, initial encounter: Secondary | ICD-10-CM | POA: Diagnosis present

## 2023-03-13 DIAGNOSIS — Z79890 Hormone replacement therapy (postmenopausal): Secondary | ICD-10-CM

## 2023-03-13 DIAGNOSIS — E860 Dehydration: Secondary | ICD-10-CM | POA: Diagnosis present

## 2023-03-13 DIAGNOSIS — Z6837 Body mass index (BMI) 37.0-37.9, adult: Secondary | ICD-10-CM | POA: Diagnosis not present

## 2023-03-13 DIAGNOSIS — Y92009 Unspecified place in unspecified non-institutional (private) residence as the place of occurrence of the external cause: Secondary | ICD-10-CM

## 2023-03-13 DIAGNOSIS — Z801 Family history of malignant neoplasm of trachea, bronchus and lung: Secondary | ICD-10-CM

## 2023-03-13 DIAGNOSIS — I5020 Unspecified systolic (congestive) heart failure: Secondary | ICD-10-CM | POA: Diagnosis present

## 2023-03-13 DIAGNOSIS — E1010 Type 1 diabetes mellitus with ketoacidosis without coma: Principal | ICD-10-CM

## 2023-03-13 DIAGNOSIS — G40909 Epilepsy, unspecified, not intractable, without status epilepticus: Principal | ICD-10-CM | POA: Diagnosis present

## 2023-03-13 DIAGNOSIS — Z803 Family history of malignant neoplasm of breast: Secondary | ICD-10-CM

## 2023-03-13 DIAGNOSIS — R739 Hyperglycemia, unspecified: Secondary | ICD-10-CM | POA: Insufficient documentation

## 2023-03-13 DIAGNOSIS — E66812 Obesity, class 2: Secondary | ICD-10-CM | POA: Diagnosis present

## 2023-03-13 DIAGNOSIS — Z79899 Other long term (current) drug therapy: Secondary | ICD-10-CM

## 2023-03-13 LAB — URINALYSIS, ROUTINE W REFLEX MICROSCOPIC
Bilirubin Urine: NEGATIVE
Glucose, UA: >=500 mg/dL — AB
Ketones, ur: 5 mg/dL — AB
Leukocytes,Ua: NEGATIVE
Nitrite: NEGATIVE
Protein, ur: >=300 mg/dL — AB
Specific Gravity, Urine: 1.014 (ref 1.005–1.030)
pH: 5 (ref 5.0–8.0)

## 2023-03-13 LAB — COMPREHENSIVE METABOLIC PANEL
ALT: 20 U/L (ref 0–44)
AST: 16 U/L (ref 15–41)
Albumin: 2.9 g/dL — ABNORMAL LOW (ref 3.5–5.0)
Alkaline Phosphatase: 62 U/L (ref 38–126)
Anion gap: 15 (ref 5–15)
BUN: 43 mg/dL — ABNORMAL HIGH (ref 6–20)
CO2: 21 mmol/L — ABNORMAL LOW (ref 22–32)
Calcium: 8.8 mg/dL — ABNORMAL LOW (ref 8.9–10.3)
Chloride: 89 mmol/L — ABNORMAL LOW (ref 98–111)
Creatinine, Ser: 3.78 mg/dL — ABNORMAL HIGH (ref 0.44–1.00)
GFR, Estimated: 15 mL/min — ABNORMAL LOW (ref >60)
Glucose, Bld: 789 mg/dL (ref 70–99)
Potassium: 4.3 mmol/L (ref 3.5–5.1)
Sodium: 125 mmol/L — ABNORMAL LOW (ref 135–145)
Total Bilirubin: 1.3 mg/dL — ABNORMAL HIGH (ref <1.2)
Total Protein: 6 g/dL — ABNORMAL LOW (ref 6.5–8.1)

## 2023-03-13 LAB — CBC
HCT: 34.6 % — ABNORMAL LOW (ref 36.0–46.0)
Hemoglobin: 11.5 g/dL — ABNORMAL LOW (ref 12.0–15.0)
MCH: 30.6 pg (ref 26.0–34.0)
MCHC: 33.2 g/dL (ref 30.0–36.0)
MCV: 92 fL (ref 80.0–100.0)
Platelets: 295 10*3/uL (ref 150–400)
RBC: 3.76 MIL/uL — ABNORMAL LOW (ref 3.87–5.11)
RDW: 13 % (ref 11.5–15.5)
WBC: 8 10*3/uL (ref 4.0–10.5)
nRBC: 0 % (ref 0.0–0.2)

## 2023-03-13 LAB — BLOOD GAS, VENOUS
Acid-base deficit: 0.4 mmol/L (ref 0.0–2.0)
Bicarbonate: 25.4 mmol/L (ref 20.0–28.0)
Drawn by: 7049
O2 Saturation: 55.6 %
Patient temperature: 36.8
pCO2, Ven: 45 mm[Hg] (ref 44–60)
pH, Ven: 7.36 (ref 7.25–7.43)
pO2, Ven: 32 mm[Hg] (ref 32–45)

## 2023-03-13 LAB — CBG MONITORING, ED
Glucose-Capillary: >600 mg/dL (ref 70–99)
Glucose-Capillary: >600 mg/dL (ref 70–99)

## 2023-03-13 LAB — BETA-HYDROXYBUTYRIC ACID: Beta-Hydroxybutyric Acid: 2.24 mmol/L — ABNORMAL HIGH (ref 0.05–0.27)

## 2023-03-13 LAB — POC URINE PREG, ED: Preg Test, Ur: NEGATIVE

## 2023-03-13 MED ORDER — DEXTROSE IN LACTATED RINGERS 5 % IV SOLN: INTRAVENOUS | Status: DC

## 2023-03-13 MED ORDER — LACTATED RINGERS IV SOLN: INTRAVENOUS | Status: AC

## 2023-03-13 MED ORDER — INSULIN REGULAR(HUMAN) IN NACL 100-0.9 UT/100ML-% IV SOLN
INTRAVENOUS | Status: DC
  Administered 2023-03-13: 8 [IU]/h via INTRAVENOUS
  Administered 2023-03-15: 1.3 [IU]/h via INTRAVENOUS
  Filled 2023-03-13 (×3): qty 100

## 2023-03-13 MED ORDER — LEVETIRACETAM IN NACL 1000 MG/100ML IV SOLN
1000.0000 mg | Freq: Once | INTRAVENOUS | Status: AC
  Administered 2023-03-13: 1000 mg via INTRAVENOUS
  Filled 2023-03-13: qty 100

## 2023-03-13 MED ORDER — LACTATED RINGERS IV BOLUS
20.0000 mL/kg | Freq: Once | INTRAVENOUS | Status: AC
  Administered 2023-03-13: 2020 mL via INTRAVENOUS

## 2023-03-13 MED ORDER — POTASSIUM CHLORIDE 10 MEQ/100ML IV SOLN
10.0000 meq | INTRAVENOUS | Status: AC
  Administered 2023-03-13 – 2023-03-14 (×2): 10 meq via INTRAVENOUS
  Filled 2023-03-13 (×2): qty 100

## 2023-03-13 MED ORDER — DEXTROSE 50 % IV SOLN: 0.0000 mL | INTRAVENOUS | Status: DC | PRN

## 2023-03-13 NOTE — ED Notes (Signed)
Pt has been cleaned up and placed on purewick per RN.

## 2023-03-13 NOTE — ED Notes (Signed)
Pt with abrasion to R eye and L knee,

## 2023-03-13 NOTE — H&P (Signed)
History and Physical    Patient: Lauren [name] AOZ:[id number] DOB: 1981/03/13 DOA: 03/13/2023 DOS: the patient was seen and examined on 03/13/2023 PCP: Billie Lade, MD  Patient coming from: {Point_of_Origin:[address]}  Chief Complaint:  Chief Complaint  Patient presents with   Seizures   HPI: Lauren [name] is a 42 y.o. female with medical history significant of ***  Review of Systems: {ROS_Text:26778} Past Medical History:  Diagnosis Date   Anemia    CAD (coronary artery disease)    a. s/p cath in 03/2014 showing 30% mid-LAD, moderate to severe disease along small D1, patent LCx, moderate to severe distal OM2 stenosis and moderate diffuse diease along RCA not amenable to PCI   CHF (congestive heart failure) (HCC)    a. EF 55-60% in 12/2019 b. EF at 35-40% by echo in 05/2020   CKD (chronic kidney disease) stage 4, GFR 15-29 ml/min (HCC)    Diabetes mellitus without complication (HCC)    Myocardial infarction (HCC)    Neuropathy    PVD (peripheral vascular disease) (HCC)    Stroke (HCC) 01/2022   L hand weakness   Past Surgical History:  Procedure Laterality Date   ABDOMINAL AORTOGRAM W/LOWER EXTREMITY N/A 06/10/2022   Procedure: ABDOMINAL AORTOGRAM W/LOWER EXTREMITY;  Surgeon: Cephus Shelling, MD;  Location: [facility];  Service: Cardiovascular;  Laterality: N/A;   AMPUTATION Left 09/02/2021   Procedure: AMPUTATION RAY;  Surgeon: Edwin Cap, DPM;  Location: [facility] OR;  Service: Podiatry;  Laterality: Left;  sagittal saw, 3L bag saline & Pulse   Cardiac catherization     CHOLECYSTECTOMY     I & D EXTREMITY Left 08/30/2021   Procedure: IRRIGATION AND DEBRIDEMENT WITH BONE BIOPSY;  Surgeon: Candelaria Stagers, DPM;  Location: [facility] OR;  Service: Podiatry;  Laterality: Left;   IR ANGIO VERTEBRAL SEL SUBCLAVIAN INNOMINATE UNI L MOD SED  01/18/2022   IR CT HEAD LTD  01/08/2022   IR INTRA CRAN STENT  01/08/2022   IR PERCUTANEOUS ART THROMBECTOMY/INFUSION INTRACRANIAL INC DIAG  ANGIO  01/08/2022   IRRIGATION AND DEBRIDEMENT FOOT Left 09/04/2021   Procedure: IRRIGATION AND DEBRIDEMENT FOOT AND CLOSURE;  Surgeon: Edwin Cap, DPM;  Location: [facility];  Service: Podiatry;  Laterality: Left;   LEFT HEART CATH AND CORONARY ANGIOGRAPHY N/A 07/26/2022   Procedure: LEFT HEART CATH AND CORONARY ANGIOGRAPHY;  Surgeon: [place], Peter M, MD;  Location: [facility] INVAS[facility] CV LAB;  Service: Cardiovascular;  Laterality: N/A;   METATARSAL OSTEOTOMY Left 07/21/2022   Procedure: LEFT FOOT EXCISION OF FIFTH METATARSAL WITH DEBRIDEMENT OF ULCER, POSSIBLE BONE BIOPSY OF THE FOURTH METATARSAL, POSSIBLE FOURTH RAY AMPUTATION OF LEFT TOEAND METATARSAL;  Surgeon: Vivi Barrack, DPM;  Location: [facility] OR;  Service: Podiatry;  Laterality: Left;   PERIPHERAL VASCULAR BALLOON ANGIOPLASTY Left 06/10/2022   Procedure: PERIPHERAL VASCULAR BALLOON ANGIOPLASTY;  Surgeon: Cephus Shelling, MD;  Location: [facility];  Service: Cardiovascular;  Laterality: Left;  AT and DP   PERIPHERAL VASCULAR INTERVENTION Left 06/10/2022   Procedure: PERIPHERAL VASCULAR INTERVENTION;  Surgeon: Cephus Shelling, MD;  Location: [facility] INVASIVE CV LAB;  Service: Cardiovascular;  Laterality: Left;  SFA   RADIOLOGY WITH ANESTHESIA N/A 01/08/2022   Procedure: IR WITH ANESTHESIA;  Surgeon: Radiologist, Medication, MD;  Location: [facility] OR;  Service: Radiology;  Laterality: N/A;   THROMBECTOMY BRACHIAL ARTERY Right 11/18/2021   Procedure: RIGHT BRACHIAL, RADIAL, & ULNAR ARTERY THROMBECTOMY.;  Surgeon: Cephus Shelling, MD;  Location: [facility] OR;  Service: Vascular;  Laterality: Right;   TUBAL LIGATION     Social History:  reports that she quit smoking about 14 months ago. Her smoking use included cigarettes. She started smoking about 16 years ago. She has a 15 pack-year smoking history. She has been exposed to tobacco smoke. She has never used smokeless tobacco. She reports current alcohol use. She reports that she does not use  drugs.  Allergies  Allergen Reactions   Visipaque [Iodixanol] Nausea And Vomiting    Patient had vagal response during procedure became hypertensive, and bradycardic. CO2 used during procedure prior to contrast being used, this may be cause as well. Recommended premedicating prior to contrast being used in the future.    Wellbutrin [Bupropion] Hives   Gabapentin     dizzy   Cefepime Rash    Tolerates penicilllin   Ciprofloxacin Hcl Hives and Rash    Hives/rash at injection site; 01/15/22 tolerated IV cipro   Tape Rash    Family History  Problem Relation Age of Onset   Thyroid disease Mother    Hypertension Mother    Hyperlipidemia Mother    CAD Mother    CVA Mother    Hyperlipidemia Father    Hypertension Father    CAD Father    Breast cancer Paternal Grandmother 58   Cancer Paternal Grandmother        lung    Prior to Admission medications   Medication Sig Start Date End Date Taking? Authorizing Provider  acetaminophen (TYLENOL) 325 MG tablet Take 1-2 tablets (325-650 mg total) by mouth every 4 (four) hours as needed for mild pain. Patient taking differently: Take 650 mg by mouth as needed for mild pain (pain score 1-3). 02/18/22   Love, Evlyn Kanner, PA-C  atorvastatin (LIPITOR) 40 MG tablet TAKE 1 TABLET BY MOUTH ONCE DAILY. 01/25/23   Billie Lade, MD  clopidogrel (PLAVIX) 75 MG tablet Take 1 tablet (75 mg total) by mouth daily. 04/29/22   Micki Riley, MD  Continuous Glucose Sensor (DEXCOM G6 SENSOR) MISC CHANGE SENSOR EVERY 10 DAYS. 01/05/23   Dani Gobble, NP  Continuous Glucose Transmitter (DEXCOM G6 TRANSMITTER) MISC Change transmitter every 90 days as directed. 01/20/23   Roma Kayser, MD  furosemide (LASIX) 40 MG tablet Take 40 mg daily as needed for swelling Patient taking differently: Take 20 mg by mouth daily as needed for edema or fluid. 03/05/22   Antoine Poche, MD  hydrALAZINE (APRESOLINE) 25 MG tablet Take 1 tablet (25 mg total) by mouth  every 8 (eight) hours. Patient taking differently: Take 25 mg by mouth in the morning and at bedtime. 11/12/22   Noralee Stain, DO  ibuprofen (ADVIL) 200 MG tablet Take 200 mg by mouth every 6 (six) hours as needed for mild pain (pain score 1-3).    [provider]  insulin aspart (NOVOLOG) 100 UNIT/ML injection Use with Omnipod for daily dose around 100 units daily Patient taking differently: Inject 0-100 Units into the skin See admin instructions. Use with Omnipod 10/18/22   Dani Gobble, NP  Insulin Disposable Pump (OMNIPOD 5 G6 PODS, GEN 5,) MISC Change pod every 48-72 hours. 11/11/22   Dani Gobble, NP  isosorbide mononitrate (IMDUR) 60 MG 24 hr tablet TAKE 2 TABLETS BY MOUTH ONCE DAILY. 02/21/23   Billie Lade, MD  levothyroxine (SYNTHROID) 125 MCG tablet Take 1 tablet (125 mcg total) by mouth daily at 6 (six) AM. 06/17/22 06/12/23  Dani Gobble,  NP  metoCLOPramide (REGLAN) 10 MG tablet Take 10 mg by mouth 4 (four) times daily. 02/24/23   [provider]  metoprolol succinate (TOPROL-XL) 50 MG 24 hr tablet Take 50 mg by mouth daily. 02/24/23   [provider]  Multiple Vitamin (MULTIVITAMIN WITH MINERALS) TABS tablet Take 1 tablet by mouth daily. 02/18/22   Love, Evlyn Kanner, PA-C  nitroGLYCERIN (NITROSTAT) 0.4 MG SL tablet Place 1 tablet (0.4 mg total) under the tongue every 5 (five) minutes x 3 doses as needed for chest pain. 02/18/22   Love, Evlyn Kanner, PA-C  oxyCODONE-acetaminophen (PERCOCET) 7.5-325 MG tablet Take 1 tablet by mouth daily as needed for severe pain. 02/08/23   Fanny Dance, MD  pantoprazole (PROTONIX) 40 MG tablet Take 1 tablet (40 mg total) by mouth daily. Courtesy fill/ pt to get established with a primary MD for further refills Patient taking differently: Take 40 mg by mouth daily as needed (reflux). 06/01/22 03/05/23  Billie Lade, MD  ranolazine (RANEXA) 1000 MG SR tablet Take 1 tablet (1,000 mg total) by mouth 2 (two) times  daily. 01/20/23   Billie Lade, MD  scopolamine (TRANSDERM-SCOP) 1 MG/3DAYS Place 1 patch (1.5 mg total) onto the skin every 3 (three) days. 02/18/23   Billie Lade, MD  senna-docusate (SENOKOT-S) 8.6-50 MG tablet Take 1-2 tablets by mouth daily as needed for mild constipation. 08/11/22   Gardenia Phlegm, MD  tiZANidine (ZANAFLEX) 4 MG tablet Take 1 tablet (4 mg total) by mouth at bedtime. 01/20/23   Billie Lade, MD  topiramate (TOPAMAX) 25 MG tablet Take 0.5-1 tablets (12.5-25 mg total) by mouth at bedtime. Patient taking differently: Take 12.5 mg by mouth at bedtime. 11/22/22 11/22/23  Fanny Dance, MD  traZODone (DESYREL) 150 MG tablet Take 1 tablet (150 mg total) by mouth at bedtime. 02/04/23   Billie Lade, MD  warfarin (COUMADIN) 5 MG tablet TAKE ONE TABLET BY MOUTH DAILY EXCEPT 1/2 TABLET ON WEDNESDAYS AND SATURDAYS OR AS DIRECTED Patient taking differently: Take 2.5-5 mg by mouth daily. Take 5 mg daily and 2.5 mg on Wednesdays and Saturdays 02/14/23   Antoine Poche, MD    Physical Exam: Vitals:   03/13/23 2145 03/13/23 2147  BP:  113/83  Pulse:  98  Resp:  18  Temp:  98 F (36.7 C)  TempSrc:  Oral  SpO2:  97%  Weight: 101 kg   Height: 5\' 5"  (1.651 m)    *** Data Reviewed: {Tip this will not be part of the note when signed- Document your independent interpretation of telemetry tracing, EKG, lab, Radiology test or any other diagnostic tests. Add any new diagnostic test ordered today. (Optional):26781} {Results:26384}  Assessment and Plan: No notes have been filed under this hospital service. Service: Hospitalist     Advance Care Planning:   Code Status: Prior ***  Consults: ***  Family Communication: ***  Severity of Illness: {Observation/Inpatient:21159}  Author: Lilyan Gilford, DO 03/13/2023 11:43 PM  For on call review [email or web address].

## 2023-03-13 NOTE — ED Triage Notes (Signed)
Per mother pt started shaking all over and fell out of chair striking left knee and right eye. Pt states her blood sugar has been high all day.

## 2023-03-13 NOTE — ED Provider Notes (Signed)
[place] EMERGENCY DEPARTMENT AT [facility] Provider Note   CSN: [id number] Arrival date & time: 03/13/23  2139     History  Chief Complaint  Patient presents with   Seizures    Lauren [name] is a 42 y.o. female.   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  She has a history of anticoagulation on Plavix, she is a diabetic, she reports that she was eating lots of sugar today because her transient hypoglycemia overnight last night was concerning for her.  She states that she was sitting on the front porch of her mother's house tonight when she had what appeared to be a witnessed seizure.  The patient does not recall any of this but was witnessed by the patient's mother at who is how she was at.  She had some shaking activity to the point where she had fallen out of a chair and struck her left knee, she was unresponsive for a short time and then came back around.  She is now back to her basic mental status.  Both of her children have now arrived and states that she has been refusing to take seizure medications despite having intermittent seizures in the past since her stroke.  They report that she is somewhat defiant with her medications and does not always want to take them but they do agree that she has her mental status and is able to make decisions for herself.  The patient reports to me that she is frustrated by her lack of mobility, her inability to care for self completely, she understands the treatment for diabetes and understands that she is supposed to be on medicine for seizures but refuses and tells me that is because she is on too many medications and gets frustrated with so many medicines.    Home Medications Prior to Admission medications   Medication Sig Start Date End Date  Taking? Authorizing Provider  acetaminophen (TYLENOL) 325 MG tablet Take 1-2 tablets (325-650 mg total) by mouth every 4 (four) hours as needed for mild pain. Patient taking differently: Take 650 mg by mouth as needed for mild pain (pain score 1-3). 02/18/22   Love, Evlyn Kanner, PA-C  atorvastatin (LIPITOR) 40 MG tablet TAKE 1 TABLET BY MOUTH ONCE DAILY. 01/25/23   Billie Lade, MD  clopidogrel (PLAVIX) 75 MG tablet Take 1 tablet (75 mg total) by mouth daily. 04/29/22   Micki Riley, MD  Continuous Glucose Sensor (DEXCOM G6 SENSOR) MISC CHANGE SENSOR EVERY 10 DAYS. 01/05/23   Dani Gobble, NP  Continuous Glucose Transmitter (DEXCOM G6 TRANSMITTER) MISC Change transmitter every 90 days as directed. 01/20/23   Roma Kayser, MD  furosemide (LASIX) 40 MG tablet Take 40 mg daily as needed for swelling Patient taking differently: Take 20 mg by mouth daily as needed for edema or fluid. 03/05/22   Antoine Poche, MD  hydrALAZINE (APRESOLINE) 25 MG tablet Take 1 tablet (25 mg total) by mouth every 8 (eight) hours. Patient taking differently: Take 25 mg by mouth in the morning and at bedtime. 11/12/22   Noralee Stain, DO  ibuprofen (ADVIL) 200 MG tablet Take 200 mg by mouth every 6 (six) hours as needed for mild pain (pain score 1-3).    [provider]  insulin aspart (NOVOLOG) 100 UNIT/ML  injection Use with Omnipod for daily dose around 100 units daily Patient taking differently: Inject 0-100 Units into the skin See admin instructions. Use with Omnipod 10/18/22   Dani Gobble, NP  Insulin Disposable Pump (OMNIPOD 5 G6 PODS, GEN 5,) MISC Change pod every 48-72 hours. 11/11/22   Dani Gobble, NP  isosorbide mononitrate (IMDUR) 60 MG 24 hr tablet TAKE 2 TABLETS BY MOUTH ONCE DAILY. 02/21/23   Billie Lade, MD  levothyroxine (SYNTHROID) 125 MCG tablet Take 1 tablet (125 mcg total) by mouth daily at 6 (six) AM. 06/17/22 06/12/23  Dani Gobble, NP  metoCLOPramide  (REGLAN) 10 MG tablet Take 10 mg by mouth 4 (four) times daily. 02/24/23   [provider]  metoprolol succinate (TOPROL-XL) 50 MG 24 hr tablet Take 50 mg by mouth daily. 02/24/23   [provider]  Multiple Vitamin (MULTIVITAMIN WITH MINERALS) TABS tablet Take 1 tablet by mouth daily. 02/18/22   Love, Evlyn Kanner, PA-C  nitroGLYCERIN (NITROSTAT) 0.4 MG SL tablet Place 1 tablet (0.4 mg total) under the tongue every 5 (five) minutes x 3 doses as needed for chest pain. 02/18/22   Love, Evlyn Kanner, PA-C  oxyCODONE-acetaminophen (PERCOCET) 7.5-325 MG tablet Take 1 tablet by mouth daily as needed for severe pain. 02/08/23   Fanny Dance, MD  pantoprazole (PROTONIX) 40 MG tablet Take 1 tablet (40 mg total) by mouth daily. Courtesy fill/ pt to get established with a primary MD for further refills Patient taking differently: Take 40 mg by mouth daily as needed (reflux). 06/01/22 03/05/23  Billie Lade, MD  ranolazine (RANEXA) 1000 MG SR tablet Take 1 tablet (1,000 mg total) by mouth 2 (two) times daily. 01/20/23   Billie Lade, MD  scopolamine (TRANSDERM-SCOP) 1 MG/3DAYS Place 1 patch (1.5 mg total) onto the skin every 3 (three) days. 02/18/23   Billie Lade, MD  senna-docusate (SENOKOT-S) 8.6-50 MG tablet Take 1-2 tablets by mouth daily as needed for mild constipation. 08/11/22   Gardenia Phlegm, MD  tiZANidine (ZANAFLEX) 4 MG tablet Take 1 tablet (4 mg total) by mouth at bedtime. 01/20/23   Billie Lade, MD  topiramate (TOPAMAX) 25 MG tablet Take 0.5-1 tablets (12.5-25 mg total) by mouth at bedtime. Patient taking differently: Take 12.5 mg by mouth at bedtime. 11/22/22 11/22/23  Fanny Dance, MD  traZODone (DESYREL) 150 MG tablet Take 1 tablet (150 mg total) by mouth at bedtime. 02/04/23   Billie Lade, MD  warfarin (COUMADIN) 5 MG tablet TAKE ONE TABLET BY MOUTH DAILY EXCEPT 1/2 TABLET ON WEDNESDAYS AND SATURDAYS OR AS DIRECTED Patient taking differently: Take 2.5-5 mg by  mouth daily. Take 5 mg daily and 2.5 mg on Wednesdays and Saturdays 02/14/23   Antoine Poche, MD      Allergies    Visipaque [iodixanol], Wellbutrin [bupropion], Gabapentin, Cefepime, Ciprofloxacin hcl, and Tape    Review of Systems   Review of Systems  Neurological:  Positive for seizures.  All other systems reviewed and are negative.   Physical Exam Updated Vital Signs BP 113/83   Pulse 98   Temp 98 F (36.7 C) (Oral)   Resp 18   Ht 1.651 m (5\' 5" )   Wt 101 kg   LMP 03/10/2023   SpO2 97%   BMI 37.05 kg/m  Physical Exam Vitals and nursing note reviewed.  Constitutional:      General: She is not in acute distress.    Appearance: She is well-developed.  HENT:     Head: Normocephalic and atraumatic.     Mouth/Throat:     Pharynx: No oropharyngeal exudate.  Eyes:     General: No scleral icterus.       Right eye: No discharge.        Left eye: No discharge.     Conjunctiva/sclera: Conjunctivae normal.     Pupils: Pupils are equal, round, and reactive to light.  Neck:     Thyroid: No thyromegaly.     Vascular: No JVD.  Cardiovascular:     Rate and Rhythm: Normal rate and regular rhythm.     Heart sounds: Normal heart sounds. No murmur heard.    No friction rub. No gallop.  Pulmonary:     Effort: Pulmonary effort is normal. No respiratory distress.     Breath sounds: Normal breath sounds. No wheezing or rales.  Abdominal:     General: Bowel sounds are normal. There is no distension.     Palpations: Abdomen is soft. There is no mass.     Tenderness: There is no abdominal tenderness.  Musculoskeletal:        General: Tenderness and signs of injury present. Normal range of motion.     Cervical back: Normal range of motion and neck supple.  Lymphadenopathy:     Cervical: No cervical adenopathy.  Skin:    General: Skin is warm and dry.     Findings: No erythema or rash.  Neurological:     Mental Status: She is alert.     Coordination: Coordination normal.      Comments: The patient has a normal level of alertness, she is awake and following commands with both upper extremities and both lower extremities.  She has a very subtle left lower extremity weakness compared to the right and her left upper extremity is significantly weak compared to the right.  She follows commands without difficulty, answers questions appropriately, there is no slurred speech or cranial nerve deficits other than a very subtle left-sided facial droop  Psychiatric:        Behavior: Behavior normal.     ED Results / Procedures / Treatments   Labs (all labs ordered are listed, but only abnormal results are displayed) Labs Reviewed  CBC - Abnormal; Notable for the following components:      Result Value   RBC 3.76 (*)    Hemoglobin 11.5 (*)    HCT 34.6 (*)    All other components within normal limits  COMPREHENSIVE METABOLIC PANEL - Abnormal; Notable for the following components:   Sodium 125 (*)    Chloride 89 (*)    CO2 21 (*)    Glucose, Bld 789 (*)    BUN 43 (*)    Creatinine, Ser 3.78 (*)    Calcium 8.8 (*)    Total Protein 6.0 (*)    Albumin 2.9 (*)    Total Bilirubin 1.3 (*)    GFR, Estimated 15 (*)    All other components within normal limits  BETA-HYDROXYBUTYRIC ACID - Abnormal; Notable for the following components:   Beta-Hydroxybutyric Acid 2.24 (*)    All other components within normal limits  CBG MONITORING, ED - Abnormal; Notable for the following components:   Glucose-Capillary >600 (*)    All other components within normal limits  CBG MONITORING, ED - Abnormal; Notable for the following components:   Glucose-Capillary >600 (*)    All other components within normal limits  URINALYSIS, ROUTINE W REFLEX MICROSCOPIC  BLOOD GAS, VENOUS  POC URINE PREG, ED  CBG MONITORING, ED    EKG None  Radiology DG Knee Complete 4 Views Left  Result Date: 03/13/2023 CLINICAL DATA:  Status post trauma. EXAM: LEFT KNEE - COMPLETE 4+ VIEW COMPARISON:  None  Available. FINDINGS: No evidence of fracture, dislocation, or joint effusion. No evidence of arthropathy or other focal bone abnormality. A radiopaque vascular stent is seen within the medial aspect of the distal left thigh. A small suprapatellar effusion is noted. IMPRESSION: 1. Small suprapatellar effusion. 2. No acute osseous abnormality. Electronically Signed   By: Aram Candela M.D.   On: 03/13/2023 23:24   CT Head Wo Contrast  Result Date: 03/13/2023 CLINICAL DATA:  Larey Seat out of chair EXAM: CT HEAD WITHOUT CONTRAST CT CERVICAL SPINE WITHOUT CONTRAST TECHNIQUE: Multidetector CT imaging of the head and cervical spine was performed following the standard protocol without intravenous contrast. Multiplanar CT image reconstructions of the cervical spine were also generated. RADIATION DOSE REDUCTION: This exam was performed according to the departmental dose-optimization program which includes automated exposure control, adjustment of the mA and/or kV according to patient size and/or use of iterative reconstruction technique. COMPARISON:  01/11/2022 CT head, correlation is also made with 03/05/2023 MRI head; no prior CT cervical spine FINDINGS: CT HEAD FINDINGS Brain: No evidence of acute infarct, hemorrhage, mass, mass effect, or midline shift. No hydrocephalus or extra-axial fluid collection. Redemonstrated encephalomalacia in right cerebral hemisphere, which appears unchanged from the 03/05/2023 MRI when accounting for differences in technique. Additional remote infarct in the left occipital lobe. Vascular: No hyperdense vessel. Skull: Normal. Negative for fracture or focal lesion. Sinuses/Orbits: No acute finding. Other: The mastoid air cells are well aerated. CT CERVICAL SPINE FINDINGS Alignment: No traumatic listhesis. Skull base and vertebrae: No acute fracture. No primary bone lesion or focal pathologic process. Soft tissues and spinal canal: No prevertebral fluid or swelling. No visible canal  hematoma. Stent in the distal right extracranial ICA and petrous ICA segment Disc levels: Degenerative changes in the cervical spine. No high-grade spinal canal stenosis. Upper chest: Negative. IMPRESSION: 1. No acute intracranial process. 2. No acute fracture or traumatic listhesis in the cervical spine. Electronically Signed   By: Wiliam Ke M.D.   On: 03/13/2023 23:21   CT Cervical Spine Wo Contrast  Result Date: 03/13/2023 CLINICAL DATA:  Larey Seat out of chair EXAM: CT HEAD WITHOUT CONTRAST CT CERVICAL SPINE WITHOUT CONTRAST TECHNIQUE: Multidetector CT imaging of the head and cervical spine was performed following the standard protocol without intravenous contrast. Multiplanar CT image reconstructions of the cervical spine were also generated. RADIATION DOSE REDUCTION: This exam was performed according to the departmental dose-optimization program which includes automated exposure control, adjustment of the mA and/or kV according to patient size and/or use of iterative reconstruction technique. COMPARISON:  01/11/2022 CT head, correlation is also made with 03/05/2023 MRI head; no prior CT cervical spine FINDINGS: CT HEAD FINDINGS Brain: No evidence of acute infarct, hemorrhage, mass, mass effect, or midline shift. No hydrocephalus or extra-axial fluid collection. Redemonstrated encephalomalacia in right cerebral hemisphere, which appears unchanged from the 03/05/2023 MRI when accounting for differences in technique. Additional remote infarct in the left occipital lobe. Vascular: No hyperdense vessel. Skull: Normal. Negative for fracture or focal lesion. Sinuses/Orbits: No acute finding. Other: The mastoid air cells are well aerated. CT CERVICAL SPINE FINDINGS Alignment: No traumatic listhesis. Skull base and vertebrae: No acute fracture. No primary bone lesion or focal pathologic process. Soft tissues and spinal  canal: No prevertebral fluid or swelling. No visible canal hematoma. Stent in the distal right  extracranial ICA and petrous ICA segment Disc levels: Degenerative changes in the cervical spine. No high-grade spinal canal stenosis. Upper chest: Negative. IMPRESSION: 1. No acute intracranial process. 2. No acute fracture or traumatic listhesis in the cervical spine. Electronically Signed   By: Wiliam Ke M.D.   On: 03/13/2023 23:21    Procedures .Critical Care  Performed by: Eber Hong, MD Authorized by: Eber Hong, MD   Critical care provider statement:    Critical care time (minutes):  45   Critical care time was exclusive of:  Separately billable procedures and treating other patients and teaching time   Critical care was necessary to treat or prevent imminent or life-threatening deterioration of the following conditions:  Endocrine crisis   Critical care was time spent personally by me on the following activities:  Development of treatment plan with patient or surrogate, discussions with consultants, evaluation of patient's response to treatment, examination of patient, obtaining history from patient or surrogate, review of old charts, re-evaluation of patient's condition, pulse oximetry, ordering and review of radiographic studies, ordering and review of laboratory studies and ordering and performing treatments and interventions   I assumed direction of critical care for this patient from another provider in my specialty: no     Care discussed with: admitting provider   Comments:           Medications Ordered in ED Medications  lactated ringers bolus 2,020 mL (has no administration in time range)  insulin regular, human (MYXREDLIN) 100 units/ 100 mL infusion (has no administration in time range)  lactated ringers infusion (has no administration in time range)  dextrose 5 % in lactated ringers infusion (has no administration in time range)  dextrose 50 % solution 0-50 mL (has no administration in time range)  potassium chloride 10 mEq in 100 mL IVPB (has no administration  in time range)  levETIRAcetam (KEPPRA) IVPB 1000 mg/100 mL premix (0 mg Intravenous Stopped 03/13/23 2255)    ED Course/ Medical Decision Making/ A&P                                 Medical Decision Making Amount and/or Complexity of Data Reviewed Labs: ordered. Radiology: ordered.  Risk Prescription drug management. Decision regarding hospitalization.    This patient presents to the ED for concern of seizure-like activity and hyperglycemia, this involves an extensive number of treatment options, and is a complaint that carries with it a high risk of complications and morbidity.  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   Lab Tests:  I Ordered, and personally interpreted labs.  The pertinent results include: Metabolic panel consistent with diabetic ketoacidosis with severe hyperglycemia, acute kidney injury   Imaging Studies ordered:  I ordered imaging studies including CT scan of the head and x-ray of  the left knee I independently visualized and interpreted imaging which showed no intracranial hemorrhage, prior stroke is seen, CT scan of the cervical spine without fracture and x-ray of the knee without fracture I agree with the radiologist interpretation   Cardiac Monitoring: / EKG:  The patient was maintained on a cardiac  monitor.  I personally viewed and interpreted the cardiac monitored which showed an underlying rhythm of: Normal sinus rhythm   Consultations Obtained:  I requested consultation with the hospitalist,  and discussed lab and imaging findings as well as pertinent plan - they recommend: Admission to higher level of care this patient is   Problem List / ED Course / Critical interventions / Medication management  This patient is critically ill with what appears to be diabetic ketoacidosis, she has been relatively noncompliant with her medications and now has had a seizure, this is not her first seizure but seems to be recurrent, she has been resistant to taking medications for this however I did give her Keppra, I started her on insulin drip, she was given some IV fluids, she has renal failure worse than her usual which is likely consistent with the DKA and dehydration.  She is critically ill and will need to be admitted to the hospital. I have reviewed the patients home medicines and have made adjustments as needed   Social Determinants of Health:  Prior stroke, limited movement, DKA   Test / Admission - Considered:  Admit to higher level of care, critically ill         Final Clinical Impression(s) / ED Diagnoses Final diagnoses:  Diabetic ketoacidosis without coma associated with type 1 diabetes mellitus (HCC)  Seizure (HCC)  AKI (acute kidney injury) (HCC)     Eber Hong, MD 03/13/23 2338

## 2023-03-13 NOTE — ED Notes (Signed)
Patient transported to CT 

## 2023-03-14 ENCOUNTER — Ambulatory Visit: Payer: Self-pay | Admitting: Hematology and Oncology

## 2023-03-14 ENCOUNTER — Other Ambulatory Visit (HOSPITAL_COMMUNITY): Payer: Self-pay

## 2023-03-14 DIAGNOSIS — R569 Unspecified convulsions: Secondary | ICD-10-CM | POA: Diagnosis not present

## 2023-03-14 DIAGNOSIS — N1832 Chronic kidney disease, stage 3b: Secondary | ICD-10-CM

## 2023-03-14 DIAGNOSIS — R739 Hyperglycemia, unspecified: Secondary | ICD-10-CM | POA: Insufficient documentation

## 2023-03-14 DIAGNOSIS — I739 Peripheral vascular disease, unspecified: Secondary | ICD-10-CM | POA: Diagnosis not present

## 2023-03-14 DIAGNOSIS — E1100 Type 2 diabetes mellitus with hyperosmolarity without nonketotic hyperglycemic-hyperosmolar coma (NKHHC): Secondary | ICD-10-CM | POA: Diagnosis present

## 2023-03-14 DIAGNOSIS — I10 Essential (primary) hypertension: Secondary | ICD-10-CM | POA: Diagnosis not present

## 2023-03-14 LAB — COMPREHENSIVE METABOLIC PANEL
ALT: 17 U/L (ref 0–44)
AST: 16 U/L (ref 15–41)
Albumin: 2.6 g/dL — ABNORMAL LOW (ref 3.5–5.0)
Alkaline Phosphatase: 55 U/L (ref 38–126)
Anion gap: 10 (ref 5–15)
BUN: 40 mg/dL — ABNORMAL HIGH (ref 6–20)
CO2: 25 mmol/L (ref 22–32)
Calcium: 8.4 mg/dL — ABNORMAL LOW (ref 8.9–10.3)
Chloride: 98 mmol/L (ref 98–111)
Creatinine, Ser: 3.44 mg/dL — ABNORMAL HIGH (ref 0.44–1.00)
GFR, Estimated: 17 mL/min — ABNORMAL LOW (ref >60)
Glucose, Bld: 229 mg/dL — ABNORMAL HIGH (ref 70–99)
Potassium: 3.3 mmol/L — ABNORMAL LOW (ref 3.5–5.1)
Sodium: 133 mmol/L — ABNORMAL LOW (ref 135–145)
Total Bilirubin: 0.4 mg/dL (ref <1.2)
Total Protein: 5.4 g/dL — ABNORMAL LOW (ref 6.5–8.1)

## 2023-03-14 LAB — CBC WITH DIFFERENTIAL/PLATELET
Abs Immature Granulocytes: 0.03 10*3/uL (ref 0.00–0.07)
Basophils Absolute: 0.1 10*3/uL (ref 0.0–0.1)
Basophils Relative: 1 %
Eosinophils Absolute: 0.1 10*3/uL (ref 0.0–0.5)
Eosinophils Relative: 2 %
HCT: 33.5 % — ABNORMAL LOW (ref 36.0–46.0)
Hemoglobin: 10.9 g/dL — ABNORMAL LOW (ref 12.0–15.0)
Immature Granulocytes: 0 %
Lymphocytes Relative: 20 %
Lymphs Abs: 1.9 10*3/uL (ref 0.7–4.0)
MCH: 29.6 pg (ref 26.0–34.0)
MCHC: 32.5 g/dL (ref 30.0–36.0)
MCV: 91 fL (ref 80.0–100.0)
Monocytes Absolute: 0.7 10*3/uL (ref 0.1–1.0)
Monocytes Relative: 8 %
Neutro Abs: 6.3 10*3/uL (ref 1.7–7.7)
Neutrophils Relative %: 69 %
Platelets: 293 10*3/uL (ref 150–400)
RBC: 3.68 MIL/uL — ABNORMAL LOW (ref 3.87–5.11)
RDW: 12.8 % (ref 11.5–15.5)
WBC: 9.1 10*3/uL (ref 4.0–10.5)
nRBC: 0 % (ref 0.0–0.2)

## 2023-03-14 LAB — GLUCOSE, CAPILLARY
Glucose-Capillary: 102 mg/dL — ABNORMAL HIGH (ref 70–99)
Glucose-Capillary: 109 mg/dL — ABNORMAL HIGH (ref 70–99)
Glucose-Capillary: 118 mg/dL — ABNORMAL HIGH (ref 70–99)
Glucose-Capillary: 154 mg/dL — ABNORMAL HIGH (ref 70–99)
Glucose-Capillary: 169 mg/dL — ABNORMAL HIGH (ref 70–99)
Glucose-Capillary: 172 mg/dL — ABNORMAL HIGH (ref 70–99)
Glucose-Capillary: 235 mg/dL — ABNORMAL HIGH (ref 70–99)
Glucose-Capillary: 311 mg/dL — ABNORMAL HIGH (ref 70–99)
Glucose-Capillary: 363 mg/dL — ABNORMAL HIGH (ref 70–99)
Glucose-Capillary: 401 mg/dL — ABNORMAL HIGH (ref 70–99)

## 2023-03-14 LAB — BASIC METABOLIC PANEL
Anion gap: 11 (ref 5–15)
Anion gap: 11 (ref 5–15)
BUN: 40 mg/dL — ABNORMAL HIGH (ref 6–20)
BUN: 41 mg/dL — ABNORMAL HIGH (ref 6–20)
CO2: 23 mmol/L (ref 22–32)
CO2: 24 mmol/L (ref 22–32)
Calcium: 8.1 mg/dL — ABNORMAL LOW (ref 8.9–10.3)
Calcium: 8.1 mg/dL — ABNORMAL LOW (ref 8.9–10.3)
Chloride: 98 mmol/L (ref 98–111)
Chloride: 99 mmol/L (ref 98–111)
Creatinine, Ser: 3.46 mg/dL — ABNORMAL HIGH (ref 0.44–1.00)
Creatinine, Ser: 3.63 mg/dL — ABNORMAL HIGH (ref 0.44–1.00)
GFR, Estimated: 16 mL/min — ABNORMAL LOW (ref >60)
GFR, Estimated: 16 mL/min — ABNORMAL LOW (ref >60)
Glucose, Bld: 244 mg/dL — ABNORMAL HIGH (ref 70–99)
Glucose, Bld: 227 mg/dL — ABNORMAL HIGH (ref 70–99)
Potassium: 4.1 mmol/L (ref 3.5–5.1)
Potassium: 3.3 mmol/L — ABNORMAL LOW (ref 3.5–5.1)
Sodium: 132 mmol/L — ABNORMAL LOW (ref 135–145)
Sodium: 134 mmol/L — ABNORMAL LOW (ref 135–145)

## 2023-03-14 LAB — CBG MONITORING, ED
Glucose-Capillary: 133 mg/dL — ABNORMAL HIGH (ref 70–99)
Glucose-Capillary: 166 mg/dL — ABNORMAL HIGH (ref 70–99)
Glucose-Capillary: 242 mg/dL — ABNORMAL HIGH (ref 70–99)
Glucose-Capillary: 291 mg/dL — ABNORMAL HIGH (ref 70–99)
Glucose-Capillary: 359 mg/dL — ABNORMAL HIGH (ref 70–99)
Glucose-Capillary: 369 mg/dL — ABNORMAL HIGH (ref 70–99)
Glucose-Capillary: 408 mg/dL — ABNORMAL HIGH (ref 70–99)
Glucose-Capillary: 433 mg/dL — ABNORMAL HIGH (ref 70–99)
Glucose-Capillary: 453 mg/dL — ABNORMAL HIGH (ref 70–99)
Glucose-Capillary: 485 mg/dL — ABNORMAL HIGH (ref 70–99)
Glucose-Capillary: 489 mg/dL — ABNORMAL HIGH (ref 70–99)
Glucose-Capillary: 572 mg/dL (ref 70–99)
Glucose-Capillary: >600 mg/dL (ref 70–99)

## 2023-03-14 LAB — PROTIME-INR
INR: 3.2 — ABNORMAL HIGH (ref 0.8–1.2)
Prothrombin Time: 32.7 s — ABNORMAL HIGH (ref 11.4–15.2)

## 2023-03-14 LAB — MRSA NEXT GEN BY PCR, NASAL: MRSA by PCR Next Gen: NOT DETECTED

## 2023-03-14 LAB — TSH: TSH: 21.271 u[IU]/mL — ABNORMAL HIGH (ref 0.350–4.500)

## 2023-03-14 LAB — MAGNESIUM: Magnesium: 1.7 mg/dL (ref 1.7–2.4)

## 2023-03-14 MED ORDER — ONDANSETRON HCL 4 MG/2ML IJ SOLN: 4.0000 mg | Freq: Four times a day (QID) | INTRAMUSCULAR | Status: DC | PRN

## 2023-03-14 MED ORDER — MORPHINE SULFATE (PF) 2 MG/ML IV SOLN: 2.0000 mg | INTRAVENOUS | Status: DC | PRN

## 2023-03-14 MED ORDER — ISOSORBIDE MONONITRATE ER 60 MG PO TB24
120.0000 mg | ORAL_TABLET | Freq: Every day | ORAL | Status: DC
  Administered 2023-03-14 – 2023-03-16 (×3): 120 mg via ORAL
  Filled 2023-03-14 (×4): qty 2

## 2023-03-14 MED ORDER — HYDRALAZINE HCL 25 MG PO TABS
25.0000 mg | ORAL_TABLET | Freq: Two times a day (BID) | ORAL | Status: DC
  Administered 2023-03-14 – 2023-03-16 (×4): 25 mg via ORAL
  Filled 2023-03-14 (×4): qty 1

## 2023-03-14 MED ORDER — CLOPIDOGREL BISULFATE 75 MG PO TABS
75.0000 mg | ORAL_TABLET | Freq: Every day | ORAL | Status: DC
  Administered 2023-03-14 – 2023-03-16 (×3): 75 mg via ORAL
  Filled 2023-03-14 (×3): qty 1

## 2023-03-14 MED ORDER — CHLORHEXIDINE GLUCONATE CLOTH 2 % EX PADS
6.0000 | MEDICATED_PAD | Freq: Every day | CUTANEOUS | Status: DC
  Administered 2023-03-14 – 2023-03-15 (×2): 6 via TOPICAL

## 2023-03-14 MED ORDER — WARFARIN SODIUM 2.5 MG PO TABS: 2.5000 mg | ORAL_TABLET | Freq: Every day | ORAL | Status: DC

## 2023-03-14 MED ORDER — PANTOPRAZOLE SODIUM 40 MG PO TBEC
40.0000 mg | DELAYED_RELEASE_TABLET | Freq: Every day | ORAL | Status: DC | PRN
  Administered 2023-03-15 – 2023-03-16 (×2): 40 mg via ORAL
  Filled 2023-03-14 (×2): qty 1

## 2023-03-14 MED ORDER — ORAL CARE MOUTH RINSE: 15.0000 mL | OROMUCOSAL | Status: DC | PRN

## 2023-03-14 MED ORDER — OXYCODONE HCL 5 MG PO TABS: 5.0000 mg | ORAL_TABLET | Freq: Four times a day (QID) | ORAL | Status: DC | PRN

## 2023-03-14 MED ORDER — ACETAMINOPHEN 650 MG RE SUPP: 650.0000 mg | Freq: Four times a day (QID) | RECTAL | Status: DC | PRN

## 2023-03-14 MED ORDER — TOPIRAMATE 25 MG PO TABS
50.0000 mg | ORAL_TABLET | Freq: Two times a day (BID) | ORAL | Status: DC
  Administered 2023-03-14 – 2023-03-16 (×5): 50 mg via ORAL
  Filled 2023-03-14 (×5): qty 2

## 2023-03-14 MED ORDER — LEVOTHYROXINE SODIUM 25 MCG PO TABS
125.0000 ug | ORAL_TABLET | Freq: Every day | ORAL | Status: DC
  Administered 2023-03-14 – 2023-03-16 (×3): 125 ug via ORAL
  Filled 2023-03-14: qty 1
  Filled 2023-03-14: qty 3
  Filled 2023-03-14: qty 1

## 2023-03-14 MED ORDER — ONDANSETRON HCL 4 MG PO TABS
4.0000 mg | ORAL_TABLET | Freq: Four times a day (QID) | ORAL | Status: DC | PRN
  Filled 2023-03-14: qty 1

## 2023-03-14 MED ORDER — DEXCOM G6 SENSOR MISC
1 refills | Status: DC
  Filled 2023-03-14: qty 3, 30d supply, fill #0
  Filled 2023-04-05 – 2023-04-06 (×2): qty 3, 30d supply, fill #1

## 2023-03-14 MED ORDER — HYDRALAZINE HCL 25 MG PO TABS
25.0000 mg | ORAL_TABLET | Freq: Three times a day (TID) | ORAL | Status: DC
  Filled 2023-03-14: qty 1

## 2023-03-14 MED ORDER — LEVETIRACETAM 500 MG PO TABS: 500.0000 mg | ORAL_TABLET | Freq: Two times a day (BID) | ORAL | Status: DC

## 2023-03-14 MED ORDER — CLONAZEPAM 0.25 MG PO TBDP: 1.0000 mg | ORAL_TABLET | ORAL | Status: DC | PRN

## 2023-03-14 MED ORDER — ATORVASTATIN CALCIUM 40 MG PO TABS
40.0000 mg | ORAL_TABLET | Freq: Every day | ORAL | Status: DC
  Administered 2023-03-14 – 2023-03-16 (×3): 40 mg via ORAL
  Filled 2023-03-14 (×3): qty 1

## 2023-03-14 MED ORDER — OXYCODONE HCL 5 MG PO TABS: 5.0000 mg | ORAL_TABLET | ORAL | Status: DC | PRN

## 2023-03-14 MED ORDER — RANOLAZINE ER 500 MG PO TB12
1000.0000 mg | ORAL_TABLET | Freq: Two times a day (BID) | ORAL | Status: DC
  Administered 2023-03-14 – 2023-03-16 (×5): 1000 mg via ORAL
  Filled 2023-03-14 (×5): qty 2

## 2023-03-14 MED ORDER — ACETAMINOPHEN 325 MG PO TABS: 650.0000 mg | ORAL_TABLET | Freq: Four times a day (QID) | ORAL | Status: DC | PRN

## 2023-03-14 MED ORDER — TRAZODONE HCL 50 MG PO TABS
150.0000 mg | ORAL_TABLET | Freq: Every day | ORAL | Status: DC
  Administered 2023-03-14 – 2023-03-15 (×2): 150 mg via ORAL
  Filled 2023-03-14 (×2): qty 3

## 2023-03-14 MED ORDER — ALBUTEROL SULFATE (2.5 MG/3ML) 0.083% IN NEBU: 2.5000 mg | INHALATION_SOLUTION | RESPIRATORY_TRACT | Status: DC | PRN

## 2023-03-14 MED ORDER — METOPROLOL SUCCINATE ER 50 MG PO TB24
50.0000 mg | ORAL_TABLET | Freq: Every day | ORAL | Status: DC
  Administered 2023-03-15 – 2023-03-16 (×2): 50 mg via ORAL
  Filled 2023-03-14 (×2): qty 1

## 2023-03-14 NOTE — TOC Initial Note (Signed)
Transition of Care [facility]) - Initial/Assessment Note    Patient Details  Name: Lauren [name] MRN: [id number] Date of Birth: August 07, 1980  Transition of Care [facility]) CM/SW Contact:    Karn Cassis, LCSW Phone Number: 03/14/2023, 10:29 AM  Clinical Narrative: Pt admitted for seizure. Assessment completed due to high risk readmission score. Pt reports she lives with her husband and son. She requires assist with ADLs from husband and sister. Pt primarily uses wheelchair. Her children or sister take her to appointments. Pt plans to return home when medically stable. No home health prior to admission. TOC will follow.                   Expected Discharge Plan: Home/Self Care Barriers to Discharge: Continued Medical Work up   Patient Goals and CMS Choice Patient states their goals for this hospitalization and ongoing recovery are:: return home   Choice offered to / list presented to : Patient [place] ownership interest in [facility].provided to::  (n/a)    Expected Discharge Plan and Services In-house Referral: Clinical Social Work     Living arrangements for the past 2 months: Apartment                                      Prior Living Arrangements/Services Living arrangements for the past 2 months: Apartment Lives with:: Spouse, Adult Children Patient language and need for interpreter reviewed:: Yes Do you feel safe going back to the place where you live?: Yes        Care giver support system in place?: Yes (comment) Current home services: DME (walker, wheelchair, shower chair) Criminal Activity/Legal Involvement Pertinent to Current Situation/Hospitalization: No - Comment as needed  Activities of Daily Living      Permission Sought/Granted                  Emotional Assessment     Affect (typically observed): Appropriate Orientation: : Oriented to Self, Oriented to Place, Oriented to  Time, Oriented to Situation Alcohol / Substance  Use: Not Applicable Psych Involvement: No (comment)  Admission diagnosis:  Seizure (HCC) [R56.9] Patient Active Problem List   Diagnosis Date Noted   Hyperglycemia 03/14/2023   Seizure (HCC) 03/13/2023   Behavior concern in adult 03/06/2023   Episodes of formed visual hallucinations 03/06/2023   Unstable angina (HCC) 11/08/2022   Chest pain due to CAD (HCC) 11/07/2022   Seizure-like activity (HCC) 10/28/2022   Acute cystitis without hematuria 10/28/2022   Dizziness 10/11/2022   Right-sided chest wall pain 10/11/2022   Injury of left thumb 10/11/2022   CKD (chronic kidney disease) 09/29/2022   History of blood clots 09/29/2022   Productive cough 09/20/2022   Abnormal TSH 09/13/2022   Lung nodule seen on imaging study 08/11/2022   Elevated troponin level not due myocardial infarction -> level still declining from previous infarction 08/03/2022   Diarrhea 08/03/2022   Chronic nausea 08/02/2022   Osteomyelitis (HCC) 07/20/2022   Diabetic foot infection (HCC) 07/19/2022   PVD (peripheral vascular disease) (HCC) 07/19/2022   DNR (do not resuscitate) 07/19/2022   Critical limb ischemia of left lower extremity (HCC) 06/01/2022   Open abdominal wall wound, initial encounter 04/28/2022   Insomnia 04/07/2022   Encounter for therapeutic drug monitoring 04/07/2022   Ulcer of left foot due to type 2 diabetes mellitus (HCC)    Acute on chronic combined systolic and  diastolic CHF (congestive heart failure) (HCC)    Ischemic cardiomyopathy    Acute right MCA stroke (HCC) 01/28/2022   Elevated troponin    Aspiration pneumonia (HCC) 01/18/2022   Acute respiratory failure with hypoxia (HCC)    HFrEF (heart failure with reduced ejection fraction) (HCC)    Right carotid artery occlusion 01/08/2022   Internal carotid artery occlusion, 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   Menorrhagia with irregular cycle 12/15/2021   Pregnancy examination or test, negative result 12/15/2021   Encounter for IUD insertion  12/15/2021   Critical ischemia of upper extremity (HCC) 11/18/2021   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) 11/18/2021   Cellulitis of left foot 09/02/2021   Acute osteomyelitis of left foot (HCC) 09/02/2021   Foot ulcer (HCC) 08/27/2021   Sepsis (HCC) 08/27/2021   Hypokalemia 08/27/2021   Cellulitis 06/18/2021   Chronic combined systolic and diastolic heart failure (HCC) 06/18/2021   History of CVA (cerebrovascular accident) 06/18/2021   Encounter for cervical Pap smear with pelvic exam 07/23/2020   Menorrhagia with regular cycle 07/23/2020   Iron deficiency anemia due to chronic blood loss 07/23/2020   Menstrual cramps 07/23/2020   Class 2 obesity 06/18/2020   CAD (coronary artery disease) 06/18/2020   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) 06/18/2020   Angina at rest [facility]) 06/18/2020   Mixed hyperlipidemia    Class 2 obesity due to excess calories without serious comorbidity with body mass index (BMI) of 39.0 to 39.9 in adult    AKI (acute kidney injury) (HCC) 01/01/2020   Acute ischemic stroke (HCC) 01/01/2020   Tobacco abuse 01/01/2020   Cerebrovascular accident (CVA) (HCC) 12/31/2019   Left sided numbness 12/31/2019   Vitamin D deficiency 07/24/2015   Primary hypertension 03/17/2015   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 [place] [address] Phone: [phone] Fax: [phone]  [facility] [address] [place] [address] Phone: [phone] Fax: [phone]  [facility] [address], [place] - [address] [place] [address] [place] [address] [place] [address] Phone: [phone] Fax: [phone]     Social Determinants of Health (SDOH) Social History: SDOH Screenings   Food Insecurity: No Food Insecurity (11/16/2022)  Housing:  Patient Declined (11/09/2022)  Transportation Needs: No Transportation Needs (11/16/2022)  Utilities: Not At Risk (11/16/2022)  Alcohol Screen: Low Risk  (11/09/2022)  Depression (PHQ2-9): Low Risk  (02/18/2023)  Recent Concern: Depression (PHQ2-9) - Medium Risk (02/04/2023)  Financial Resource Strain: Low Risk  (11/09/2022)  Physical Activity: Inactive (03/03/2022)  Social Connections: Socially Integrated (03/03/2022)  Stress: No Stress Concern Present (03/03/2022)  Recent Concern: Stress - Stress Concern Present (12/22/2021)  Tobacco Use: Medium Risk (03/13/2023)   SDOH Interventions:     Readmission Risk Interventions    08/05/2022   11:08 AM  Readmission Risk Prevention Plan  Transportation Screening Complete  Medication Review (RN Care Manager) Complete  PCP or Specialist appointment within 3-5 days of discharge Complete  Palliative Care Screening Not Applicable  Skilled Nursing Facility Not Applicable

## 2023-03-14 NOTE — Assessment & Plan Note (Signed)
-   Creatinine at baseline 3.07 - Creatinine today 3.78 - Avoid nephrotoxic agents when possible - Repeat chemistry is late because the ER has a policy to not participate in my inpatient orders

## 2023-03-14 NOTE — Assessment & Plan Note (Signed)
-   Continue statin, Imdur, Ranexa, Plavix

## 2023-03-14 NOTE — Assessment & Plan Note (Signed)
Continue Synthroid °

## 2023-03-14 NOTE — ED Notes (Signed)
CBG trending back up, current cbg 369, insulin drip titrated accordingly, at 10.5 units/hr--MD made aware

## 2023-03-14 NOTE — Assessment & Plan Note (Signed)
-   Continue hydralazine, metoprolol 

## 2023-03-14 NOTE — Assessment & Plan Note (Addendum)
-   Reported seizure-like activity - Supposed to be on seizure medication at home but has not been compliant - Consult neuro - Patient was given Keppra in the ED - Seizure precautions - Patient did experience some head trauma with the seizure - CT C-spine and head showed no intracranial abnormality acutely, no fracture or traumatic listhesis in the C-spine - Continue to monitor

## 2023-03-14 NOTE — ED Notes (Signed)
Stroke Neurotele cart placed in room for pt to speak with neurology

## 2023-03-14 NOTE — Progress Notes (Signed)
Progress Note   Patient: Lauren [name] WUJ:[id number] DOB: 08-02-1980 DOA: 03/13/2023     1 DOS: the patient was seen and examined on 03/14/2023   Brief hospital admission course: As per H&P written by Dr. Carren Rang on 03/13/23 Lauren [name] is a 42 y.o. female with medical history significant of coronary artery disease, CHF, CKD, history of stroke, history of seizure, and more presents the ER with a chief complaint of seizure-like activity.  Patient has a right black eye.  She reports this is from a fall.  She reports she fell because of a seizure.  She has no memory of the fall or the seizure.  The Nexium she remembers is waking up on the ground at home.  She reports she was not confused when she woke up, and was oriented.  She has not been taking any antiseizure medication even though she has some prescribed.  She is no nausea, vomiting.  She has no polyuria, polydipsia.  She reports her glucose has been running "high at home."  When asked how high she reports over 400.  When told that her glucose was nearly 800 here, she just goes back to sleep.  Patient does have peripheral edema, but is not able to answer if that is chronic or not.  Her urine is dark, but she is not answering if that is an acute issue or not either.  Patient is overall poor historian.  In the middle of the conversation she rolls over or turns her back to me and tries to go back to sleep.  When I walked over to the lower side of the bed and woke up again she would make eye contact but not answer questions.  This could be possibly related to medication she was given in the ED, or just a lack of participation in HPI.   Assessment and Plan: * Seizure (HCC) - Reported seizure-like activity -Case has been discussed with neurology service; EEG has been ordered -Patient was given Keppra in the ED. -Planning to increase Topamax dose to 50 mg twice a day and follow response. -Medication compliance discussed with patient. -  Continue seizure precautions. - Patient did experience some head trauma with the seizure - CT C-spine and head showed no intracranial abnormality acutely, no fracture or traumatic listhesis in the C-spine  Primary hypertension - Continue hydralazine, metoprolol -Heart healthy diet discussed with patient.  Hypothyroidism -Patient TSH in the 21 range -Will continue Synthroid; has discussed with patient compliance and the fact that the medication need to be taken on empty stomach for thing in the morning. -Patient will require close monitoring of thyroid panel with further adjustment to medication as required.  CAD (coronary artery disease) - Continue statin, Imdur, Ranexa, Plavix -Currently no chest pain or shortness of breath.  Hyperglycemic crisis - Update A1c -Continue treatment with insulin drip -No presence of DKA currently appreciated.  PVD (peripheral vascular disease) (HCC) - Continue statin medication and Plavix -Continue patient follow-up.  AKI (acute kidney injury) (HCC) - Creatinine at baseline 3.07 - Creatinine 3.78 at time of admission. -Most likely associated with prerenal azotemia/dehydration -Continue to maintain adequate hydration -Follow renal function trend -Minimize/avoid nephrotoxic agents, contrast and hypotension.  Prior history of stroke -No new focal deficits appreciated -Continue risk factor modification -Continue Plavix for secondary prevention.  Hypokalemia -Repleted -Continue to follow electrolytes trend.  Class 2 obesity -Body mass index is 37.05 kg/m. -Low-calorie diet and portion control discussed with patient.  Subjective:  No chest pain, no  nausea, no vomiting.  No further seizure activity appreciated.  Insulin drip in place.  Physical Exam: Vitals:   03/14/23 0730 03/14/23 0830 03/14/23 0900 03/14/23 1049  BP: 125/80 117/65 112/67 95/62  Pulse: 83   88  Resp: 14 14 20  (!) 23  Temp: 98.1 F (36.7 C)   98 F (36.7 C)   TempSrc: Oral   Oral  SpO2: 98%   98%  Weight:      Height:       General exam: Alert, awake, slow while answering questions but appropriate and in no major distress.  Reports no chest pain, no nausea, no vomiting. Respiratory system: Clear to auscultation. Respiratory effort normal.  Good saturation on room air.  No using accessory muscles. Cardiovascular system:RRR. No rubs or gallops. Gastrointestinal system: Abdomen is obese, nondistended, soft and nontender. No organomegaly or masses felt. Normal bowel sounds heard. Central nervous system: No new focal neurological deficits. Extremities: No cyanosis or clubbing. Skin: No petechiae. Psychiatry: Mood & affect appropriate.   Data Reviewed: Complete metabolic panel: Sodium 133, potassium 3.3, bicarb 25, glucose in the 200 range, BUN 40, creatinine 3.44, normal LFTs and GFR 17. CBC: WBCs 9.1, hemoglobin 10.9 and platelet count 293K TSH: 21.271  Family Communication: Daughter at bedside.  Disposition: Status is: Inpatient Remains inpatient appropriate because: Continue insulin therapy and further workup for seizure disorder.   Planned Discharge Destination: Home   Time spent: 50 minutes  Author: Vassie Loll, MD 03/14/2023 10:58 AM  For on call review [email or web address].

## 2023-03-14 NOTE — Progress Notes (Signed)
PHARMACY - ANTICOAGULATION CONSULT NOTE  Pharmacy Consult for Coumadin Indication: stroke  Allergies  Allergen Reactions   Visipaque [Iodixanol] Nausea And Vomiting    Patient had vagal response during procedure became hypertensive, and bradycardic. CO2 used during procedure prior to contrast being used, this may be cause as well. Recommended premedicating prior to contrast being used in the future.    Wellbutrin [Bupropion] Hives   Gabapentin     dizzy   Cefepime Rash    Tolerates penicilllin   Ciprofloxacin Hcl Hives and Rash    Hives/rash at injection site; 01/15/22 tolerated IV cipro   Tape Rash    Patient Measurements: Height: 5\' 5"  (165.1 cm) Weight: 101 kg (222 lb 10.6 oz) IBW/kg (Calculated) : 57   Vital Signs: Temp: 98 F (36.7 C) (11/11 1100) Temp Source: Oral (11/11 1100) BP: 109/66 (11/11 1100) Pulse Rate: 88 (11/11 1100)  Labs: Recent Labs    03/13/23 2213 03/13/23 2232 03/14/23 0625 03/14/23 1026  HGB 11.5*  --  10.9*  --   HCT 34.6*  --  33.5*  --   PLT 295  --  293  --   LABPROT  --   --   --  32.7*  INR  --   --   --  3.2*  CREATININE  --  3.78* 3.44* 3.46*    Estimated Creatinine Clearance: 25.5 mL/min (A) (by C-G formula based on SCr of 3.46 mg/dL (H)).   Medical History: Past Medical History:  Diagnosis Date   Anemia    CAD (coronary artery disease)    a. s/p cath in 03/2014 showing 30% mid-LAD, moderate to severe disease along small D1, patent LCx, moderate to severe distal OM2 stenosis and moderate diffuse diease along RCA not amenable to PCI   CHF (congestive heart failure) (HCC)    a. EF 55-60% in 12/2019 b. EF at 35-40% by echo in 05/2020   CKD (chronic kidney disease) stage 4, GFR 15-29 ml/min (HCC)    Diabetes mellitus without complication (HCC)    Myocardial infarction (HCC)    Neuropathy    PVD (peripheral vascular disease) (HCC)    Stroke (HCC) 01/2022   L hand weakness    Medications:  See med rec  Assessment: 42  y.o. female with medical history significant of coronary artery disease, CHF, CKD, history of stroke, history of seizure, and more presents the ER with a chief complaint of seizure-like activity. Patient chronically anticoagulated with coumadin for previous ischemic stroke.  INR 3.2, supratherapeutic  Followed in anticoag clinic=> home dose 5mg  daily on Mon, Tues, Thurs, Friday and Sun; 2.5mg  on Wed and Sat  Goal of Therapy:  INR 2-3 Monitor platelets by anticoagulation protocol: Yes   Plan:  No COumadin today Daily PT-INR Monitor for S/S of bleeding  Elder Cyphers, BS Pharm D, BCPS Clinical Pharmacist 03/14/2023,12:14 PM

## 2023-03-14 NOTE — ED Notes (Signed)
Pt called out for R arm IV "being out after I turned to the side" IV was witnessed to be out. Pt has one IV access site left, L arm 22g

## 2023-03-14 NOTE — Assessment & Plan Note (Addendum)
-   Currently on insulin drip - Continue insulin drip - Monitor CBGs - Check hemoglobin A1c - Repeat chemistry has not been done because the ER has a policy to not participate in my inpatient orders - Continue to monitor

## 2023-03-14 NOTE — ED Notes (Addendum)
Urine canister changed, urine output 1000

## 2023-03-14 NOTE — ED Notes (Signed)
Cbg 133, MD made aware

## 2023-03-14 NOTE — Consult Note (Signed)
I connected with  Lauren [name] on 03/14/23 by a video enabled telemedicine application and verified that I am speaking with the correct person using two identifiers.   I discussed the limitations of evaluation and management by telemedicine. The patient expressed understanding and agreed to proceed.  Location of patient: [facility] Location of physician: [facility]   Neurology Consultation Reason for Consult: Seizure Referring Physician: Dr. Verlee MonteCamillo Flaming  CC: seizure  History is obtained from: Patient, chart review  HPI: Lauren [name] is a 42 y.o. 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   Past Medical History:  Diagnosis Date   Anemia    CAD (coronary artery disease)    a. s/p cath in 03/2014 showing 30% mid-LAD, moderate to severe disease along small D1, patent LCx, moderate to severe distal OM2 stenosis and moderate diffuse diease along RCA not amenable to PCI   CHF (congestive heart failure) (HCC)    a. EF 55-60% in 12/2019 b. EF at 35-40% by echo in 05/2020   CKD (chronic kidney disease) stage 4, GFR 15-29 ml/min (HCC)    Diabetes mellitus without complication (HCC)    Myocardial infarction (HCC)    Neuropathy    PVD (peripheral vascular disease) (HCC)    Stroke (HCC) 01/2022   L hand weakness     Family History  Problem Relation Age of Onset   Thyroid disease Mother    Hypertension Mother    Hyperlipidemia Mother    CAD Mother    CVA Mother    Hyperlipidemia Father    Hypertension Father    CAD Father    Breast cancer Paternal Grandmother 19   Cancer Paternal Grandmother        lung    Social History:  reports that she quit smoking about 14  months ago. Her smoking use included cigarettes. She started smoking about 16 years ago. She has a 15 pack-year smoking history. She has been exposed to tobacco smoke. She has never used smokeless tobacco. She reports current alcohol use. She reports that she does not use drugs.  Exam: Current vital signs: BP 112/67   Pulse 83   Temp 98.1 F (36.7 C) (Oral)   Resp 20   Ht 5\' 5"  (1.651 m)   Wt 101 kg   LMP 03/10/2023   SpO2 98%   BMI 37.05 kg/m  Vital signs in last 24 hours: Temp:  [98 F (36.7 C)-98.1 F (36.7 C)] 98.1 F (36.7 C) (11/11 0730) Pulse Rate:  [83-98] 83 (11/11 0730) Resp:  [14-20] 20 (11/11 0900) BP: (112-125)/(45-83) 112/67 (11/11 0900) SpO2:  [96 %-98 %] 98 % (11/11 0730) Weight:  [101 kg] 101 kg (11/10 2145)   Physical Exam  Constitutional: Appears well-developed and well-nourished.  Psych: Affect appropriate to situation Neuro:   I have reviewed labs in epic and the results pertinent to this consultation are: CBC:  Recent Labs  Lab 03/13/23 2213 03/14/23 0625  WBC 8.0 9.1  NEUTROABS  --  6.3  HGB 11.5* 10.9*  HCT 34.6* 33.5*  MCV 92.0 91.0  PLT 295 293    Basic Metabolic Panel:  Lab Results  Component Value Date   NA 133 (L) 03/14/2023   K  3.3 (L) 03/14/2023   CO2 25 03/14/2023   GLUCOSE 229 (H) 03/14/2023   BUN 40 (H) 03/14/2023   CREATININE 3.44 (H) 03/14/2023   CALCIUM 8.4 (L) 03/14/2023   GFRNONAA 17 (L) 03/14/2023   GFRAA 31 (L) 02/01/2020   Lipid Panel:  Lab Results  Component Value Date   LDLCALC 57 07/26/2022   HgbA1c:  Lab Results  Component Value Date   HGBA1C 8.3 (A) 10/18/2022   Urine Drug Screen:     Component Value Date/Time   LABOPIA NONE DETECTED 03/05/2023 1641   COCAINSCRNUR NONE DETECTED 03/05/2023 1641   LABBENZ NONE DETECTED 03/05/2023 1641   AMPHETMU NONE DETECTED 03/05/2023 1641   THCU NONE DETECTED 03/05/2023 1641   LABBARB NONE DETECTED 03/05/2023 1641    Alcohol Level     Component Value  Date/Time   ETH <10 03/05/2023 1553     I have reviewed the images obtained:  CT head without contrast 03/13/2023: No acute abnormality. Redemonstrated encephalomalacia in right cerebral hemisphere, which appears unchanged  ASSESSMENT/PLAN: 42 year old female with seizure-like activity in the setting of prior right hemispheric encephalomalacia as well as blood glucose >600.  Epilepsy -Even though the seizure could have been provoked due to hyperglycemia, patient has prior cortical encephalomalacia which increases her risk of subsequent seizures.  Therefore would recommend anti-seizure medications -Per chart review patient is already on Topamax.  Therefore will increase Topamax to 50 mg twice daily -Rescue medication: Recommend clonazepam 1 mg for seizure lasting more than 2 minutes -Also counseled patient about seizure precautions including do not drive -Recommend [facility] with [facility] in 2-3 months (order placed) -Discussed seizure provoking factors -Discussed plan with patient in detail.  Patient in agreement -Discussed plan with Dr. Gwenlyn Perking via secure chat   Thank you for allowing [name] to participate in the care of this patient. If you have any further questions, please contact  me or neurohospitalist.   Lindie Spruce Epilepsy [facility] neurohospitalist

## 2023-03-14 NOTE — Assessment & Plan Note (Signed)
-   Continue statin medication and Plavix

## 2023-03-14 NOTE — ED Notes (Signed)
Pt check to ensure pt was clean and dry, pt brief clean and dry and repositioned in bed

## 2023-03-15 ENCOUNTER — Encounter: Payer: Self-pay | Admitting: Hematology and Oncology

## 2023-03-15 DIAGNOSIS — I10 Essential (primary) hypertension: Secondary | ICD-10-CM | POA: Diagnosis not present

## 2023-03-15 DIAGNOSIS — I739 Peripheral vascular disease, unspecified: Secondary | ICD-10-CM | POA: Diagnosis not present

## 2023-03-15 DIAGNOSIS — R739 Hyperglycemia, unspecified: Secondary | ICD-10-CM | POA: Diagnosis not present

## 2023-03-15 DIAGNOSIS — R569 Unspecified convulsions: Secondary | ICD-10-CM | POA: Diagnosis not present

## 2023-03-15 LAB — PROTIME-INR
INR: 3.5 — ABNORMAL HIGH (ref 0.8–1.2)
Prothrombin Time: 35.7 s — ABNORMAL HIGH (ref 11.4–15.2)

## 2023-03-15 LAB — GLUCOSE, CAPILLARY
Glucose-Capillary: 101 mg/dL — ABNORMAL HIGH (ref 70–99)
Glucose-Capillary: 105 mg/dL — ABNORMAL HIGH (ref 70–99)
Glucose-Capillary: 125 mg/dL — ABNORMAL HIGH (ref 70–99)
Glucose-Capillary: 135 mg/dL — ABNORMAL HIGH (ref 70–99)
Glucose-Capillary: 137 mg/dL — ABNORMAL HIGH (ref 70–99)
Glucose-Capillary: 145 mg/dL — ABNORMAL HIGH (ref 70–99)
Glucose-Capillary: 145 mg/dL — ABNORMAL HIGH (ref 70–99)
Glucose-Capillary: 146 mg/dL — ABNORMAL HIGH (ref 70–99)
Glucose-Capillary: 153 mg/dL — ABNORMAL HIGH (ref 70–99)
Glucose-Capillary: 164 mg/dL — ABNORMAL HIGH (ref 70–99)
Glucose-Capillary: 169 mg/dL — ABNORMAL HIGH (ref 70–99)
Glucose-Capillary: 172 mg/dL — ABNORMAL HIGH (ref 70–99)
Glucose-Capillary: 178 mg/dL — ABNORMAL HIGH (ref 70–99)
Glucose-Capillary: 256 mg/dL — ABNORMAL HIGH (ref 70–99)

## 2023-03-15 MED ORDER — INSULIN ASPART 100 UNIT/ML IJ SOLN
5.0000 [IU] | Freq: Three times a day (TID) | INTRAMUSCULAR | Status: DC
  Administered 2023-03-15 – 2023-03-16 (×4): 5 [IU] via SUBCUTANEOUS

## 2023-03-15 MED ORDER — INSULIN GLARGINE-YFGN 100 UNIT/ML ~~LOC~~ SOLN
12.0000 [IU] | Freq: Two times a day (BID) | SUBCUTANEOUS | Status: DC
  Administered 2023-03-15 – 2023-03-16 (×3): 12 [IU] via SUBCUTANEOUS
  Filled 2023-03-15 (×5): qty 0.12

## 2023-03-15 MED ORDER — INSULIN ASPART 100 UNIT/ML IJ SOLN
0.0000 [IU] | Freq: Three times a day (TID) | INTRAMUSCULAR | Status: DC
  Administered 2023-03-15 (×2): 3 [IU] via SUBCUTANEOUS
  Administered 2023-03-16: 15 [IU] via SUBCUTANEOUS
  Administered 2023-03-16: 8 [IU] via SUBCUTANEOUS

## 2023-03-15 MED ORDER — INSULIN ASPART 100 UNIT/ML IJ SOLN
0.0000 [IU] | Freq: Every day | INTRAMUSCULAR | Status: DC
  Administered 2023-03-15: 3 [IU] via SUBCUTANEOUS

## 2023-03-15 NOTE — Plan of Care (Signed)
  Problem: Education: Goal: Knowledge of General Education information will improve Description Including pain rating scale, medication(s)/side effects and non-pharmacologic comfort measures Outcome: Progressing   Problem: Health Behavior/Discharge Planning: Goal: Ability to manage health-related needs will improve Outcome: Progressing   Problem: Clinical Measurements: Goal: Ability to maintain clinical measurements within normal limits will improve Outcome: Progressing Goal: Will remain free from infection Outcome: Progressing Goal: Diagnostic test results will improve Outcome: Progressing   Problem: Nutrition: Goal: Adequate nutrition will be maintained Outcome: Progressing   Problem: Coping: Goal: Level of anxiety will decrease Outcome: Progressing   

## 2023-03-15 NOTE — Progress Notes (Signed)
PHARMACY - ANTICOAGULATION CONSULT NOTE  Pharmacy Consult for Coumadin Indication: stroke  Allergies  Allergen Reactions   Visipaque [Iodixanol] Nausea And Vomiting    Patient had vagal response during procedure became hypertensive, and bradycardic. CO2 used during procedure prior to contrast being used, this may be cause as well. Recommended premedicating prior to contrast being used in the future.    Wellbutrin [Bupropion] Hives   Gabapentin     dizzy   Cefepime Rash    Tolerates penicilllin   Ciprofloxacin Hcl Hives and Rash    Hives/rash at injection site; 01/15/22 tolerated IV cipro   Tape Rash    Patient Measurements: Height: 5\' 5"  (165.1 cm) Weight: 101 kg (222 lb 10.6 oz) IBW/kg (Calculated) : 57   Vital Signs: Temp: 98 F (36.7 C) (11/12 0814) Temp Source: Oral (11/12 0814) BP: 146/72 (11/12 0400) Pulse Rate: 107 (11/12 0400)  Labs: Recent Labs    03/13/23 2213 03/13/23 2232 03/14/23 0625 03/14/23 1026 03/14/23 1725 03/15/23 0518  HGB 11.5*  --  10.9*  --   --   --   HCT 34.6*  --  33.5*  --   --   --   PLT 295  --  293  --   --   --   LABPROT  --   --   --  32.7*  --  35.7*  INR  --   --   --  3.2*  --  3.5*  CREATININE  --    < > 3.44* 3.46* 3.63*  --    < > = values in this interval not displayed.    Estimated Creatinine Clearance: 24.3 mL/min (A) (by C-G formula based on SCr of 3.63 mg/dL (H)).   Medical History: Past Medical History:  Diagnosis Date   Anemia    CAD (coronary artery disease)    a. s/p cath in 03/2014 showing 30% mid-LAD, moderate to severe disease along small D1, patent LCx, moderate to severe distal OM2 stenosis and moderate diffuse diease along RCA not amenable to PCI   CHF (congestive heart failure) (HCC)    a. EF 55-60% in 12/2019 b. EF at 35-40% by echo in 05/2020   CKD (chronic kidney disease) stage 4, GFR 15-29 ml/min (HCC)    Diabetes mellitus without complication (HCC)    Myocardial infarction (HCC)    Neuropathy     PVD (peripheral vascular disease) (HCC)    Stroke (HCC) 01/2022   L hand weakness    Medications:  See med rec  Assessment: 42 y.o. female with medical history significant of coronary artery disease, CHF, CKD, history of stroke, history of seizure, and more presents the ER with a chief complaint of seizure-like activity. Patient chronically anticoagulated with coumadin for previous ischemic stroke.  INR 3.2 > 3.5, supratherapeutic  Followed in anticoag clinic=> home dose 5mg  daily on Mon, Tues, Thurs, Friday and Sun; 2.5mg  on Wed and Sat  Goal of Therapy:  INR 2-3 Monitor platelets by anticoagulation protocol: Yes   Plan:  No Coumadin today Daily PT-INR Monitor for S/S of bleeding  Judeth Cornfield, PharmD Clinical Pharmacist 03/15/2023 8:19 AM

## 2023-03-15 NOTE — Progress Notes (Signed)
Patient's mom called for an update. No concerns during this call.

## 2023-03-15 NOTE — Progress Notes (Signed)
Endotool recommends transition off insulin gtt, MD made aware.

## 2023-03-15 NOTE — Progress Notes (Signed)
Progress Note   Patient: Lauren [name] ZOX:[id number] DOB: December 14, 1980 DOA: 03/13/2023     2 DOS: the patient was seen and examined on 03/15/2023  Brief hospital admission course: As per H&P written by Dr. Carren Rang on 03/13/23 Lauren [name] is a 42 y.o. female with medical history significant of coronary artery disease, CHF, CKD, history of stroke, history of seizure, and more presents the ER with a chief complaint of seizure-like activity.  Patient has a right black eye.  She reports this is from a fall.  She reports she fell because of a seizure.  She has no memory of the fall or the seizure.  The Nexium she remembers is waking up on the ground at home.  She reports she was not confused when she woke up, and was oriented.  She has not been taking any antiseizure medication even though she has some prescribed.  She is no nausea, vomiting.  She has no polyuria, polydipsia.  She reports her glucose has been running "high at home."  When asked how high she reports over 400.  When told that her glucose was nearly 800 here, she just goes back to sleep.  Patient does have peripheral edema, but is not able to answer if that is chronic or not.  Her urine is dark, but she is not answering if that is an acute issue or not either.  Patient is overall poor historian.  In the middle of the conversation she rolls over or turns her back to me and tries to go back to sleep.  When I walked over to the lower side of the bed and woke up again she would make eye contact but not answer questions.  This could be possibly related to medication she was given in the ED, or just a lack of participation in HPI.   Assessment and Plan: Seizure (HCC) - Reported seizure-like activity -Case has been discussed with neurology service; EEG has been ordered -Patient was given Keppra in the ED. -Planning to increase Topamax dose to 50 mg twice a day and follow response. -Outpatient follow-up with neurology service will be  arranged. -Medication compliance discussed with patient. - Continue seizure precautions. - Patient did experience some head trauma with the seizure - CT C-spine and head showed no intracranial abnormality acutely, no fracture or traumatic listhesis in the C-spine   Primary hypertension - Continue hydralazine, metoprolol -Heart healthy diet has been discussed with patient. -Continue to follow vital signs.   Hypothyroidism -Patient TSH in the 21 range -Will continue Synthroid; has discussed with patient compliance and the fact that the medication need to be taken on empty stomach for thing in the morning. -Patient will require close monitoring of thyroid panel with further adjustment to medication as required.   CAD (coronary artery disease) - Continue statin, Imdur, Ranexa, Plavix -Currently no chest pain or shortness of breath. -Continue patient follow-up with cardiology service.   Hyperglycemic crisis - Most recent A1c 8.3. -Patient condition has stabilized and safe to transition of insulin drip -Sliding scale insulin along with long-acting and meal coverage will be started -Continue to follow CBG fluctuation.   PVD (peripheral vascular disease) (HCC) - Continue statin medication and Plavix -Continue patient follow-up.   AKI (acute kidney injury) (HCC) - Creatinine at baseline 3.07 - Creatinine 3.78 at time of admission. -Maintain adequate hydration and follow renal function trend. -Continue to minimize/avoid nephrotoxic agents, contrast and hypotension.   Prior history of stroke -No new focal deficits appreciated -Continue risk  factor modification -Continue Plavix for secondary prevention. -Continue patient follow-up with neurology service.   Hypokalemia -Repleted -Continue to follow electrolytes trend and further replete as needed.   Class 2 obesity -Body mass index is 37.05 kg/m. -Low-calorie diet and portion control discussed with patient.  Subjective:  No  seizure-like activity appreciated; patient denies nausea, vomiting, chest pain and shortness of breath.  Physical Exam: Vitals:   03/15/23 1200 03/15/23 1425 03/15/23 1500 03/15/23 1612  BP: 125/73 (!) 147/83 (!) 155/96 (!) 154/89  Pulse: 99 97 98 (!) 104  Resp: 16 (!) 23 15 20   Temp:    (!) 97.4 F (36.3 C)  TempSrc:    Oral  SpO2: 99% 99% 99% 98%  Weight:    98.5 kg  Height:    5\' 5"  (1.651 m)   General exam: Alert, awake and oriented; no chest pain, no nausea, no vomiting.  More interactive and in no acute distress. Respiratory system: Clear to auscultation. Respiratory effort normal.  Good saturation on room air. Cardiovascular system:RRR. No rubs or gallops; no JVD. Gastrointestinal system: Abdomen is obese, nondistended, soft and nontender. No organomegaly or masses felt. Normal bowel sounds heard. Central nervous system: No new focal neurological deficits. Extremities: No cyanosis or clubbing. Skin: No petechiae. Psychiatry: Judgement and insight appear normal.  Flat affect appreciated.  Family Communication: No family at bedside.  Disposition: Status is: Inpatient Remains inpatient appropriate because: Transition of insulin drip; advance diet, follow see BG fluctuation and assess for any seizure recurrence.   Planned Discharge Destination: Home with Home Health  Time spent: 50 minutes  Author: Vassie Loll, MD 03/15/2023 7:27 PM  For on call review [email or web address].

## 2023-03-15 NOTE — Inpatient Diabetes Management (Signed)
Inpatient Diabetes Program Recommendations  AACE/ADA: New Consensus Statement on Inpatient Glycemic Control (2015)  Target Ranges:  Prepandial:   less than 140 mg/dL      Peak postprandial:   less than 180 mg/dL (1-2 hours)      Critically ill patients:  140 - 180 mg/dL   Lab Results  Component Value Date   GLUCAP 105 (H) 03/15/2023   HGBA1C 8.3 (A) 10/18/2022    Review of Glycemic Control  DM type 1, Omnipod insulin pump Sees Whitney reardon NP last visit 6/17 Target 110 CHO 1:5 Sensitivity 1:38 Avg basal insulin delivery on Auto mode approx 30 units in 24 hour period conservatively Current: Transitioning to Semglee 12 units bid, Novolog 0-15 units tid, 0-5 unit hs  Inpatient Diabetes Program Recommendations:   Patient sees NP Ronny Bacon for endocrinology with last office visit 10/18/22. On this visit insulin to crab ratio from changed from 1:8 to 1:5. Please consider: -Add Novolog 5 units tid meal coverage if eats 50%  Thank you, Darel Hong E. Eloy Fehl, RN, MSN, CDCES  Diabetes Coordinator Inpatient Glycemic Control Team Team Pager [phone] (8am-5pm) 03/15/2023 10:19 AM

## 2023-03-16 ENCOUNTER — Other Ambulatory Visit (HOSPITAL_COMMUNITY): Payer: Self-pay

## 2023-03-16 ENCOUNTER — Inpatient Hospital Stay: Payer: BC Managed Care – PPO | Admitting: Hematology and Oncology

## 2023-03-16 DIAGNOSIS — E1010 Type 1 diabetes mellitus with ketoacidosis without coma: Secondary | ICD-10-CM

## 2023-03-16 LAB — GLUCOSE, CAPILLARY
Glucose-Capillary: 351 mg/dL — ABNORMAL HIGH (ref 70–99)
Glucose-Capillary: 263 mg/dL — ABNORMAL HIGH (ref 70–99)
Glucose-Capillary: 107 mg/dL — ABNORMAL HIGH (ref 70–99)

## 2023-03-16 LAB — CBC
HCT: 34.5 % — ABNORMAL LOW (ref 36.0–46.0)
Hemoglobin: 11.3 g/dL — ABNORMAL LOW (ref 12.0–15.0)
MCH: 30.1 pg (ref 26.0–34.0)
MCHC: 32.8 g/dL (ref 30.0–36.0)
MCV: 92 fL (ref 80.0–100.0)
Platelets: 299 10*3/uL (ref 150–400)
RBC: 3.75 MIL/uL — ABNORMAL LOW (ref 3.87–5.11)
RDW: 13.3 % (ref 11.5–15.5)
WBC: 8 10*3/uL (ref 4.0–10.5)
nRBC: 0 % (ref 0.0–0.2)

## 2023-03-16 LAB — BASIC METABOLIC PANEL
Anion gap: 6 (ref 5–15)
BUN: 32 mg/dL — ABNORMAL HIGH (ref 6–20)
CO2: 23 mmol/L (ref 22–32)
Calcium: 7.5 mg/dL — ABNORMAL LOW (ref 8.9–10.3)
Chloride: 106 mmol/L (ref 98–111)
Creatinine, Ser: 2.78 mg/dL — ABNORMAL HIGH (ref 0.44–1.00)
GFR, Estimated: 21 mL/min — ABNORMAL LOW (ref >60)
Glucose, Bld: 300 mg/dL — ABNORMAL HIGH (ref 70–99)
Potassium: 3.8 mmol/L (ref 3.5–5.1)
Sodium: 135 mmol/L (ref 135–145)

## 2023-03-16 LAB — PROTIME-INR
INR: 3 — ABNORMAL HIGH (ref 0.8–1.2)
Prothrombin Time: 31.6 s — ABNORMAL HIGH (ref 11.4–15.2)

## 2023-03-16 MED ORDER — WARFARIN - PHARMACIST DOSING INPATIENT: Freq: Every day | Status: DC

## 2023-03-16 MED ORDER — TOPIRAMATE 50 MG PO TABS: 50.0000 mg | ORAL_TABLET | Freq: Two times a day (BID) | ORAL | 3 refills | Status: DC

## 2023-03-16 MED ORDER — CLONAZEPAM 1 MG PO TBDP: 1.0000 mg | ORAL_TABLET | Freq: Two times a day (BID) | ORAL | 0 refills | Status: DC | PRN

## 2023-03-16 MED ORDER — WARFARIN SODIUM 2.5 MG PO TABS
2.5000 mg | ORAL_TABLET | Freq: Once | ORAL | Status: AC
  Administered 2023-03-16: 2.5 mg via ORAL
  Filled 2023-03-16: qty 1

## 2023-03-16 NOTE — Discharge Instructions (Signed)
1)Take Topamax as prescribed to Reduce risk of Seizures- 2)Per [facility] statutes, patients with seizures are not allowed to drive until they have been seizure-free for six months.   Use caution when using heavy equipment or power tools. Avoid working on ladders or at heights. Take showers instead of baths, Do not lock yourself in a room alone (i.e. bathroom).. Ensure the water temperature is not too high on the home water heater. Do not go swimming alone. When caring for infants or small children, sit down when holding, feeding, or changing them to minimize risk of injury to the child in the event you have a seizure.   Do not lock yourself in a room alone (i.e. bathroom).  Maintain good sleep hygiene. Avoid alcohol.    If patient has another seizure, call 911 and bring them back to the ED if: A.  The seizure lasts longer than 5 minutes.      B.  The patient doesn't wake shortly after the seizure or has new problems such as difficulty seeing, speaking or moving following the seizure C.  The patient was injured during the seizure D.  The patient has a temperature over 102 F (39C) E.  The patient vomited during the seizure and now is having trouble breathing  3) follow-up as outpatient with neurologist Dr. Melynda Ripple in 4 to 6 weeks for recheck due to seizures  4) continue to take the rest of your medications including warfarin/Coumadin as prescribed

## 2023-03-16 NOTE — Progress Notes (Signed)
Mobility Specialist Progress Note:    03/16/23 1128  Mobility  Activity Ambulated with assistance to bathroom  Level of Assistance Standby assist, set-up cues, supervision of patient - no hands on  Assistive Device None  Distance Ambulated (ft) 10 ft  Range of Motion/Exercises Active;All extremities  Activity Response Tolerated well  Mobility Referral Yes  $Mobility charge 1 Mobility  Mobility Specialist Start Time (ACUTE ONLY) 1110  Mobility Specialist Stop Time (ACUTE ONLY) 1120  Mobility Specialist Time Calculation (min) (ACUTE ONLY) 10 min   Pt received in chair, requesting assistance to bathroom. Required SBA to stand and ambulate with no AD. Tolerated well, asx throughout. Returned pt to chair, alarm on. Call bell in reach, all needs met.   Lawerance Bach Mobility Specialist Please contact via Special educational needs teacher or  Rehab office at [phone]

## 2023-03-16 NOTE — Inpatient Diabetes Management (Signed)
Inpatient Diabetes Program Recommendations  AACE/ADA: New Consensus Statement on Inpatient Glycemic Control (2015)  Target Ranges:  Prepandial:   less than 140 mg/dL      Peak postprandial:   less than 180 mg/dL (1-2 hours)      Critically ill patients:  140 - 180 mg/dL   Lab Results  Component Value Date   GLUCAP 351 (H) 03/16/2023   HGBA1C 8.3 (A) 10/18/2022    Latest Reference Range & Units 03/15/23 07:55 03/15/23 09:04 03/15/23 10:14 03/15/23 11:14 03/15/23 11:53 03/15/23 16:58 03/15/23 20:57 03/16/23 07:36  Glucose-Capillary 70 - 99 mg/dL 027 (H) 253 (H) 664 (H) 169 (H) 164 (H) 178 (H) 256 (H) 351 (H)  (H): Data is abnormally high  Review of Glycemic Control  DM type 1, Omnipod insulin pump Sees Ronny Bacon NP last visit 6/17 Target 110 CHO 1:5 Sensitivity 1:38 Avg basal insulin delivery on Auto mode approx 30 units in 24 hour period conservatively Current: Semglee 12 units bid, Novolog 5 units tid meal coverage, Novolog 0-15 units tid, 0-5 unit hs  Inpatient Diabetes Program Recommendations:   Please consider: -Increase Semglee to 15 units bid  Thank you, Darel Hong E. Jeanee Fabre, RN, MSN, CDCES  Diabetes Coordinator Inpatient Glycemic Control Team Team Pager [phone] (8am-5pm) 03/16/2023 10:03 AM

## 2023-03-16 NOTE — TOC Transition Note (Signed)
Transition of Care [facility]) - CM/SW Discharge Note   Patient Details  Name: Lauren [name] MRN: [id number] Date of Birth: 08/15/1980  [facility]) CM/SW Contact:  Leitha Bleak, RN Phone Number: 03/16/2023, 11:32 AM   Clinical Narrative:  PT recommended outpatient PT. CM spoke with patient, she is interested and wants PT on [address]. Orders placed and added to AVS. No other needs.     Final next level of care: Home/Self Care Barriers to Discharge: Barriers Resolved   Patient Goals and CMS Choice   Choice offered to / list presented to : Patient  Discharge Placement               Patient and family notified of of transfer: 03/16/23  Discharge Plan and Services Additional resources added to the After Visit Summary for   In-house Referral: Clinical Social Work              Social Determinants of Health (SDOH) Interventions SDOH Screenings   Food Insecurity: No Food Insecurity (03/14/2023)  Housing: Patient Declined (03/14/2023)  Transportation Needs: No Transportation Needs (03/14/2023)  Utilities: Not At Risk (03/14/2023)  Alcohol Screen: Low Risk  (11/09/2022)  Depression (PHQ2-9): Low Risk  (02/18/2023)  Recent Concern: Depression (PHQ2-9) - Medium Risk (02/04/2023)  Financial Resource Strain: Low Risk  (11/09/2022)  Physical Activity: Inactive (03/03/2022)  Social Connections: Socially Integrated (03/03/2022)  Stress: No Stress Concern Present (03/03/2022)  Recent Concern: Stress - Stress Concern Present (12/22/2021)  Tobacco Use: Medium Risk (03/13/2023)     Readmission Risk Interventions    03/16/2023   11:32 AM 08/05/2022   11:08 AM  Readmission Risk Prevention Plan  Transportation Screening Complete Complete  Medication Review Oceanographer) Complete Complete  PCP or Specialist appointment within 3-5 days of discharge Not Complete Complete  HRI or Home Care Consult Complete   SW Recovery Care/Counseling Consult Complete   Palliative Care  Screening Not Applicable Not Applicable  Skilled Nursing Facility Not Applicable Not Applicable

## 2023-03-16 NOTE — Discharge Summary (Signed)
Lauren [name], is a 42 y.o. female  DOB 1980/06/26  MRN [id number].  Admission date:  03/13/2023  Admitting Physician  Vassie Loll, MD  Discharge Date:  03/16/2023   Primary MD  Billie Lade, MD  Recommendations for primary care physician for things to follow:  1)Take Topamax as prescribed to Reduce risk of Seizures- 2)Per [facility] statutes, patients with seizures are not allowed to drive until they have been seizure-free for six months.   Use caution when using heavy equipment or power tools. Avoid working on ladders or at heights. Take showers instead of baths, Do not lock yourself in a room alone (i.e. bathroom).. Ensure the water temperature is not too high on the home water heater. Do not go swimming alone. When caring for infants or small children, sit down when holding, feeding, or changing them to minimize risk of injury to the child in the event you have a seizure.   Do not lock yourself in a room alone (i.e. bathroom).  Maintain good sleep hygiene. Avoid alcohol.    If patient has another seizure, call 911 and bring them back to the ED if: A.  The seizure lasts longer than 5 minutes.      B.  The patient doesn't wake shortly after the seizure or has new problems such as difficulty seeing, speaking or moving following the seizure C.  The patient was injured during the seizure D.  The patient has a temperature over 102 F (39C) E.  The patient vomited during the seizure and now is having trouble breathing  3) follow-up as outpatient with neurologist Dr. Melynda Ripple in 4 to 6 weeks for recheck due to seizures  4) continue to take the rest of your medications including warfarin/Coumadin as prescribed    Admission Diagnosis  Seizure (HCC) [R56.9] AKI (acute kidney injury) (HCC) [N17.9] Diabetic ketoacidosis without coma associated with type 1 diabetes mellitus (HCC) [E10.10] Hyperosmolar  hyperglycemic state (HHS) (HCC) [E11.00]   Discharge Diagnosis  Seizure (HCC) [R56.9] AKI (acute kidney injury) (HCC) [N17.9] Diabetic ketoacidosis without coma associated with type 1 diabetes mellitus (HCC) [E10.10] Hyperosmolar hyperglycemic state (HHS) (HCC) [E11.00]    Principal Problem:   Seizure (HCC) Active Problems:   Primary hypertension   Hypothyroidism   CAD (coronary artery disease)   AKI (acute kidney injury) (HCC)   PVD (peripheral vascular disease) (HCC)   Hyperglycemia   Hyperosmolar hyperglycemic state (HHS) (HCC)      Past Medical History:  Diagnosis Date   Anemia    CAD (coronary artery disease)    a. s/p cath in 03/2014 showing 30% mid-LAD, moderate to severe disease along small D1, patent LCx, moderate to severe distal OM2 stenosis and moderate diffuse diease along RCA not amenable to PCI   CHF (congestive heart failure) (HCC)    a. EF 55-60% in 12/2019 b. EF at 35-40% by echo in 05/2020   CKD (chronic kidney disease) stage 4, GFR 15-29 ml/min (HCC)    Diabetes mellitus without complication (HCC)  Myocardial infarction [facility])    Neuropathy    PVD (peripheral vascular disease) (HCC)    Stroke (HCC) 01/2022   L hand weakness    Past Surgical History:  Procedure Laterality Date   ABDOMINAL AORTOGRAM W/LOWER EXTREMITY N/A 06/10/2022   Procedure: ABDOMINAL AORTOGRAM W/LOWER EXTREMITY;  Surgeon: Cephus Shelling, MD;  Location: [facility] INV[facility]IVE CV LAB;  Service: Cardiovascular;  Laterality: N/A;   AMPUTATION Left 09/02/2021   Procedure: AMPUTATION RAY;  Surgeon: Edwin Cap, DPM;  Location: [facility] OR;  Service: Podiatry;  Laterality: Left;  sagittal saw, 3L bag saline & Pulse   Cardiac catherization     CHOLECYSTECTOMY     I & D EXTREMITY Left 08/30/2021   Procedure: IRRIGATION AND DEBRIDEMENT WITH BONE BIOPSY;  Surgeon: Candelaria Stagers, DPM;  Location: [facility] OR;  Service: Podiatry;  Laterality: Left;   IR ANGIO VERTEBRAL SEL SUBCLAVIAN INNOMINATE UNI L MOD SED   01/18/2022   IR CT HEAD LTD  01/08/2022   IR INTRA CRAN STENT  01/08/2022   IR PERCUTANEOUS ART THROMBECTOMY/INFUSION INTRACRANIAL INC DIAG ANGIO  01/08/2022   IRRIGATION AND DEBRIDEMENT FOOT Left 09/04/2021   Procedure: IRRIGATION AND DEBRIDEMENT FOOT AND CLOSURE;  Surgeon: Edwin Cap, DPM;  Location: [facility] OR;  Service: Podiatry;  Laterality: Left;   LEFT HEART CATH AND CORONARY ANGIOGRAPHY N/A 07/26/2022   Procedure: LEFT HEART CATH AND CORONARY ANGIOGRAPHY;  Surgeon: Swaziland, Peter M, MD;  Location: [facility] INVASIVE CV LAB;  Service: Cardiovascular;  Laterality: N/A;   METATARSAL OSTEOTOMY Left 07/21/2022   Procedure: LEFT FOOT EXCISION OF FIFTH METATARSAL WITH DEBRIDEMENT OF ULCER, POSSIBLE BONE BIOPSY OF THE FOURTH METATARSAL, POSSIBLE FOURTH RAY AMPUTATION OF LEFT TOEAND METATARSAL;  Surgeon: Vivi Barrack, DPM;  Location: [facility];  Service: Podiatry;  Laterality: Left;   PERIPHERAL VASCULAR BALLOON ANGIOPLASTY Left 06/10/2022   Procedure: PERIPHERAL VASCULAR BALLOON ANGIOPLASTY;  Surgeon: Cephus Shelling, MD;  [facility]: [facility];  Service: Cardiovascular;  Laterality: Left;  AT and DP   PERIPHERAL VASCULAR INTERVENTION Left 06/10/2022   Procedure: PERIPHERAL VASCULAR INTERVENTION;  Surgeon: Cephus Shelling, MD;  Location[facility] INVASIVE CV LAB;  Service: Cardiovascular;  Laterality: Left;  SFA   RADIOLOGY WITH ANESTHESIA N/A 01/08/2022   Procedure: IR WITH ANESTHESIA;  Surgeon: Radiologist, Medication, MD;  Location: [facility];  Service: Radiology;  Laterality: N/A;   THROMBECTOMY BRACHIAL ARTERY Right 11/18/2021   Procedure: RIGHT BRACHIAL, RADIAL, & ULNAR ARTERY THROMBECTOMY.;  Surgeon: Cephus Shelling, MD;  Location: [facility];  Service: Vascular;  Laterality: Right;   TUBAL LIGATION       HPI  from the history and physical done on the day of admission:  HPI: Lauren [name] is a 42 y.o. female with medical history significant of coronary artery disease, CHF, CKD, history of stroke,  history of seizure, and more presents the ER with a chief complaint of seizure-like activity.  Patient has a right black eye.  She reports this is from a fall.  She reports she fell because of a seizure.  She has no memory of the fall or the seizure.  The Nexium she remembers is waking up on the ground at home.  She reports she was not confused when she woke up, and was oriented.  She has not been taking any antiseizure medication even though she has some prescribed.  She is no nausea, vomiting.  She has no polyuria, polydipsia.  She reports her glucose has been running "high  at home."  When asked how high she reports over 400.  When told that her glucose was nearly 800 here, she just goes back to sleep.  Patient does have peripheral edema, but is not able to answer if that is chronic or not.  Her urine is dark, but she is not answering if that is an acute issue or not either.  Patient is overall poor historian.  In the middle of the conversation she rolls over or turns her back to me and tries to go back to sleep.  When I walked over to the lower side of the bed and woke up again she would make eye contact but not answer questions.  This could be possibly related to medication she was given in the ED, or just a lack of participation in HPI.  After several attempts I gave upon asking things like smoking status, alcohol status, CODE STATUS. Review of Systems: unable to review all systems due to the inability of the patient to answer questions.   Hospital Course:   Assessment and Plan: 1)Seizure [facility]) -Neurology consult appreciated - Seizure precautions advised -Neurologist recommends increasing Topamax to 50 mg twice daily and using clonazepam 1 mg as needed for seizure lasting more than 2 minutes - CT C-spine and head showed no intracranial abnormality acutely, no fracture or traumatic listhesis in the C-spine -Outpatient follow-up with neurologist for ongoing seizure monitoring advised -Driving  restrictions..  And restrictions regarding operating machinery discussed in view of seizures -Topamax as prescribed for seizure prevention -May use clonazepam as needed seizures  2)HTN - Continue hydralazine,/isosorbide and  metoprolol  3)Hypothyroidism - Continue Synthroid  4)CAD (coronary artery disease) - c/n imdur, Ranexa, Plavix, atorvastatin and Coumadin  5)Hyperglycemia A1c is 8.3--reflecting uncontrolled DM with hyperglycemia PTA -Follow-up with endocrinologist for ongoing management of insulin pump -Does Not meet DKA criteria  6)PVD (peripheral vascular disease) (HCC) - Continue atorvastatin and Plavix  7)Aki on CKD IV -- - renally adjust medications, avoid nephrotoxic agents / dehydration  / hypotension - Creatinine at baseline 3.07 - Creatinine peaked at  3.78 -Creatinine trending down, currently 2.78    Discharge Instructions :-  Per [facility] statutes, patients with seizures are not allowed to drive until they have been seizure-free for six months.   Use caution when using heavy equipment or power tools. Avoid working on ladders or at heights. Take showers instead of baths, Do not lock yourself in a room alone (i.e. bathroom).. Ensure the water temperature is not too high on the home water heater. Do not go swimming alone. When caring for infants or small children, sit down when holding, feeding, or changing them to minimize risk of injury to the child in the event you have a seizure.   Do not lock yourself in a room alone (i.e. bathroom).  Maintain good sleep hygiene. Avoid alcohol.    If patient has another seizure, call 911 and bring them back to the ED if: A.  The seizure lasts longer than 5 minutes.      B.  The patient doesn't wake shortly after the seizure or has new problems such as difficulty seeing, speaking or moving following the seizure C.  The patient was injured during the seizure D.  The patient has a temperature over 102 F (39C) E.  The  patient vomited during the seizure and now is having trouble breathing  Discharge Condition: stable  Follow UP   Follow-up Information     [facility] at [place]. Schedule an appointment as soon as possible for a visit.   Specialty: Rehabilitation Contact information: [address] [place] [address] [phone]        Charlsie Quest, MD. Schedule an appointment as soon as possible for a visit in 1 month(s).   Specialty: Neurology Contact information: [address] [place] [address] [phone]                 Consults obtained -neurology  Diet and Activity recommendation:  As advised  Discharge Instructions    Discharge Instructions     Ambulatory referral to Neurology   Complete by: As directed    An appointment is requested in approximately: 8 -12 weeks   Ambulatory referral to Physical Therapy   Complete by: As directed    Call MD for:  difficulty breathing, headache or visual disturbances   Complete by: As directed    Call MD for:  persistant dizziness or light-headedness   Complete by: As directed    Call MD for:  persistant nausea and vomiting   Complete by: As directed    Call MD for:  temperature >100.4   Complete by: As directed    Diet - low sodium heart healthy   Complete by: As directed    Discharge instructions   Complete by: As directed    1)Take Topamax as prescribed to Reduce risk of Seizures- 2)Per [facility] statutes, patients with seizures are not allowed to drive until they have been seizure-free for six months.   Use caution when using heavy equipment or power tools. Avoid working on ladders or at heights. Take showers instead of baths, Do not lock yourself in a room alone (i.e. bathroom).. Ensure the water temperature is not too high on the home water heater. Do not go swimming alone. When caring for infants or small children, sit down when holding, feeding, or  changing them to minimize risk of injury to the child in the event you have a seizure.   Do not lock yourself in a room alone (i.e. bathroom).  Maintain good sleep hygiene. Avoid alcohol.    If patient has another seizure, call 911 and bring them back to the ED if: A.  The seizure lasts longer than 5 minutes.      B.  The patient doesn't wake shortly after the seizure or has new problems such as difficulty seeing, speaking or moving following the seizure C.  The patient was injured during the seizure D.  The patient has a temperature over 102 F (39C) E.  The patient vomited during the seizure and now is having trouble breathing  3) follow-up as outpatient with neurologist Dr. Melynda Ripple in 4 to 6 weeks for recheck due to seizures  4) continue to take the rest of your medications including warfarin/Coumadin as prescribed   Increase activity slowly   Complete by: As directed        Discharge Medications     Allergies as of 03/16/2023       Reactions   Visipaque [iodixanol] Nausea And Vomiting   Patient had vagal response during procedure became hypertensive, and bradycardic. CO2 used during procedure prior to contrast being used, this may be cause as well. Recommended premedicating prior to contrast being used in the future.    Wellbutrin [bupropion] Hives   Gabapentin    dizzy   Cefepime Rash   Tolerates penicilllin   Ciprofloxacin Hcl Hives, Rash  Hives/rash at injection site; 01/15/22 tolerated IV cipro   Tape Rash        Medication List     TAKE these medications    acetaminophen 325 MG tablet Commonly known as: TYLENOL Take 1-2 tablets (325-650 mg total) by mouth every 4 (four) hours as needed for mild pain. What changed:  how much to take when to take this   atorvastatin 40 MG tablet Commonly known as: LIPITOR TAKE 1 TABLET BY MOUTH ONCE DAILY.   clonazePAM 1 MG disintegrating tablet Commonly known as: KLONOPIN Take 1 tablet (1 mg total) by mouth 2 (two) times  daily as needed for seizure (For seizure lasting more than 2 minutes.).   clopidogrel 75 MG tablet Commonly known as: PLAVIX Take 1 tablet (75 mg total) by mouth daily.   Dexcom G6 Sensor Misc CHANGE SENSOR EVERY 10 DAYS.   Dexcom G6 Transmitter Misc Change transmitter every 90 days as directed.   furosemide 40 MG tablet Commonly known as: LASIX Take 40 mg daily as needed for swelling What changed:  how much to take how to take this when to take this reasons to take this additional instructions   hydrALAZINE 25 MG tablet Commonly known as: APRESOLINE Take 1 tablet (25 mg total) by mouth every 8 (eight) hours. What changed: when to take this   insulin aspart 100 UNIT/ML injection Commonly known as: novoLOG Use with Omnipod for daily dose around 100 units daily What changed:  how much to take how to take this when to take this additional instructions   isosorbide mononitrate 60 MG 24 hr tablet Commonly known as: IMDUR TAKE 2 TABLETS BY MOUTH ONCE DAILY.   levothyroxine 125 MCG tablet Commonly known as: SYNTHROID Take 1 tablet (125 mcg total) by mouth daily at 6 (six) AM.   metoCLOPramide 10 MG tablet Commonly known as: REGLAN Take 10 mg by mouth 4 (four) times daily.   metoprolol succinate 50 MG 24 hr tablet Commonly known as: TOPROL-XL Take 50 mg by mouth daily.   multivitamin with minerals Tabs tablet Take 1 tablet by mouth daily.   nitroGLYCERIN 0.4 MG SL tablet Commonly known as: NITROSTAT Place 1 tablet (0.4 mg total) under the tongue every 5 (five) minutes x 3 doses as needed for chest pain.   Omnipod 5 DexG7G6 Pods Gen 5 Misc Change pod every 48-72 hours.   oxyCODONE-acetaminophen 7.5-325 MG tablet Commonly known as: Percocet Take 1 tablet by mouth daily as needed for severe pain.   pantoprazole 40 MG tablet Commonly known as: PROTONIX Take 1 tablet (40 mg total) by mouth daily. Courtesy fill/ pt to get established with a primary MD for further  refills What changed:  when to take this reasons to take this   ranolazine 1000 MG SR tablet Commonly known as: RANEXA Take 1 tablet (1,000 mg total) by mouth 2 (two) times daily.   scopolamine 1 MG/3DAYS Commonly known as: TRANSDERM-SCOP Place 1 patch (1.5 mg total) onto the skin every 3 (three) days.   senna-docusate 8.6-50 MG tablet Commonly known as: Senokot-S Take 1-2 tablets by mouth daily as needed for mild constipation.   tiZANidine 4 MG tablet Commonly known as: ZANAFLEX Take 1 tablet (4 mg total) by mouth at bedtime.   topiramate 50 MG tablet Commonly known as: TOPAMAX Take 1 tablet (50 mg total) by mouth 2 (two) times daily. What changed:  medication strength how much to take when to take this   traZODone 150 MG tablet Commonly  known as: DESYREL Take 1 tablet (150 mg total) by mouth at bedtime.   warfarin 5 MG tablet Commonly known as: COUMADIN Take as directed. If you are unsure how to take this medication, talk to your nurse or doctor. Original instructions: TAKE ONE TABLET BY MOUTH DAILY EXCEPT 1/2 TABLET ON WEDNESDAYS AND SATURDAYS OR AS DIRECTED What changed:  how much to take how to take this when to take this additional instructions       Major procedures and Radiology Reports - PLEASE review detailed and final reports for all details, in brief -   DG Knee Complete 4 Views Left  Result Date: 03/13/2023 CLINICAL DATA:  Status post trauma. EXAM: LEFT KNEE - COMPLETE 4+ VIEW COMPARISON:  None Available. FINDINGS: No evidence of fracture, dislocation, or joint effusion. No evidence of arthropathy or other focal bone abnormality. A radiopaque vascular stent is seen within the medial aspect of the distal left thigh. A small suprapatellar effusion is noted. IMPRESSION: 1. Small suprapatellar effusion. 2. No acute osseous abnormality. Electronically Signed   By: Aram Candela M.D.   On: 03/13/2023 23:24   CT Head Wo Contrast  Result Date:  03/13/2023 CLINICAL DATA:  Larey Seat out of chair EXAM: CT HEAD WITHOUT CONTRAST CT CERVICAL SPINE WITHOUT CONTRAST TECHNIQUE: Multidetector CT imaging of the head and cervical spine was performed following the standard protocol without intravenous contrast. Multiplanar CT image reconstructions of the cervical spine were also generated. RADIATION DOSE REDUCTION: This exam was performed according to the departmental dose-optimization program which includes automated exposure control, adjustment of the mA and/or kV according to patient size and/or use of iterative reconstruction technique. COMPARISON:  01/11/2022 CT head, correlation is also made with 03/05/2023 MRI head; no prior CT cervical spine FINDINGS: CT HEAD FINDINGS Brain: No evidence of acute infarct, hemorrhage, mass, mass effect, or midline shift. No hydrocephalus or extra-axial fluid collection. Redemonstrated encephalomalacia in right cerebral hemisphere, which appears unchanged from the 03/05/2023 MRI when accounting for differences in technique. Additional remote infarct in the left occipital lobe. Vascular: No hyperdense vessel. Skull: Normal. Negative for fracture or focal lesion. Sinuses/Orbits: No acute finding. Other: The mastoid air cells are well aerated. CT CERVICAL SPINE FINDINGS Alignment: No traumatic listhesis. Skull base and vertebrae: No acute fracture. No primary bone lesion or focal pathologic process. Soft tissues and spinal canal: No prevertebral fluid or swelling. No visible canal hematoma. Stent in the distal right extracranial ICA and petrous ICA segment Disc levels: Degenerative changes in the cervical spine. No high-grade spinal canal stenosis. Upper chest: Negative. IMPRESSION: 1. No acute intracranial process. 2. No acute fracture or traumatic listhesis in the cervical spine. Electronically Signed   By: Wiliam Ke M.D.   On: 03/13/2023 23:21   CT Cervical Spine Wo Contrast  Result Date: 03/13/2023 CLINICAL DATA:  Larey Seat out  of chair EXAM: CT HEAD WITHOUT CONTRAST CT CERVICAL SPINE WITHOUT CONTRAST TECHNIQUE: Multidetector CT imaging of the head and cervical spine was performed following the standard protocol without intravenous contrast. Multiplanar CT image reconstructions of the cervical spine were also generated. RADIATION DOSE REDUCTION: This exam was performed according to the departmental dose-optimization program which includes automated exposure control, adjustment of the mA and/or kV according to patient size and/or use of iterative reconstruction technique. COMPARISON:  01/11/2022 CT head, correlation is also made with 03/05/2023 MRI head; no prior CT cervical spine FINDINGS: CT HEAD FINDINGS Brain: No evidence of acute infarct, hemorrhage, mass, mass effect, or midline shift. No  hydrocephalus or extra-axial fluid collection. Redemonstrated encephalomalacia in right cerebral hemisphere, which appears unchanged from the 03/05/2023 MRI when accounting for differences in technique. Additional remote infarct in the left occipital lobe. Vascular: No hyperdense vessel. Skull: Normal. Negative for fracture or focal lesion. Sinuses/Orbits: No acute finding. Other: The mastoid air cells are well aerated. CT CERVICAL SPINE FINDINGS Alignment: No traumatic listhesis. Skull base and vertebrae: No acute fracture. No primary bone lesion or focal pathologic process. Soft tissues and spinal canal: No prevertebral fluid or swelling. No visible canal hematoma. Stent in the distal right extracranial ICA and petrous ICA segment Disc levels: Degenerative changes in the cervical spine. No high-grade spinal canal stenosis. Upper chest: Negative. IMPRESSION: 1. No acute intracranial process. 2. No acute fracture or traumatic listhesis in the cervical spine. Electronically Signed   By: Wiliam Ke M.D.   On: 03/13/2023 23:21   MR BRAIN WO CONTRAST  Result Date: 03/05/2023 CLINICAL DATA:  History of stroke 1 year ago. Altered mental status with  auditory and visual hallucinations. EXAM: MRI HEAD WITHOUT CONTRAST TECHNIQUE: Multiplanar, multiecho pulse sequences of the brain and surrounding structures were obtained without intravenous contrast. COMPARISON:  CT head 10/21/2022, brain MRI 01/14/2022 FINDINGS: Brain: There is no acute intracranial hemorrhage, extra-axial fluid collection, or acute infarct. Numerous remote infarcts are again seen throughout the right cerebral hemisphere primarily in the MCA distribution, as well as a small remote cortical infarct in the left occipital lobe. There is ex vacuo dilatation of the right lateral ventricle and wallerian degeneration extending into the right midbrain and pons. The pituitary and suprasellar region are normal. There is no mass lesion. There is no mass effect or midline shift. Vascular: The right ICA flow void is abnormal likely reflecting the known stent. Stent patency is not assessed. The MCA flow voids Skull and upper cervical spine: Normal marrow signal. Sinuses/Orbits: The paranasal sinuses are clear. The globes and orbits are unremarkable. Other: The mastoid air cells and middle ear cavities are clear. IMPRESSION: 1. No acute intracranial pathology. 2. Extensive remote infarcts throughout the right cerebral hemisphere primarily in the MCA distribution, and small remote left occipital infarct. Electronically Signed   By: Lesia Hausen M.D.   On: 03/05/2023 18:03   DG Chest 1 View  Result Date: 03/05/2023 CLINICAL DATA:  Cough EXAM: CHEST  1 VIEW COMPARISON:  X-ray 11/07/2022.  Older exams as well FINDINGS: No consolidation, pneumothorax or effusion. No edema. Normal cardiopericardial silhouette. Air-fluid level along the stomach beneath the left hemidiaphragm. Surgical clips in the right upper quadrant of the abdomen. IMPRESSION: No acute cardiopulmonary disease. Electronically Signed   By: Karen Kays M.D.   On: 03/05/2023 16:09    Micro Results   Recent Results (from the past 240 hour(s))   MRSA Next Gen by PCR, Nasal     Status: None   Collection Time: 03/14/23  2:00 PM   Specimen: Nasal Mucosa; Nasal Swab  Result Value Ref Range Status   MRSA by PCR Next Gen NOT DETECTED NOT DETECTED Final    Comment: (NOTE) The GeneXpert MRSA Assay (FDA approved for NASAL specimens only), is one component of a comprehensive MRSA colonization surveillance program. It is not intended to diagnose MRSA infection nor to guide or monitor treatment for MRSA infections. Test performance is not FDA approved in patients less than 73 years old. Performed at [facility], [address] [place], [place] [address]     Today   Subjective    Lauren  [name] today has no new complaints No fever  Or chills   No Nausea, Vomiting or Diarrhea -- No chest pains or palpitations, no further seizures      Patient has been seen and examined prior to discharge   Objective   Blood pressure 132/85, pulse 85, temperature 97.8 F (36.6 C), temperature source Oral, resp. rate 20, height 5\' 5"  (1.651 m), weight 98.5 kg, last menstrual period 03/10/2023, SpO2 100%.   Intake/Output Summary (Last 24 hours) at 03/16/2023 1634 Last data filed at 03/16/2023 0500 Gross per 24 hour  Intake 720 ml  Output --  Net 720 ml   Exam Gen:- Awake Alert, no acute distress  HEENT:- [place].AT, No sclera icterus Neck-Supple Neck,No JVD,.  Lungs-  CTAB , good air movement bilaterally CV- S1, S2 normal, regular Abd-  +ve B.Sounds, Abd Soft, No tenderness,    Extremity/Skin:- No  edema,   good pulses Psych-affect is appropriate, oriented x3 Neuro-no further seizures, no new focal deficits, no tremors    Data Review   CBC w Diff:  Lab Results  Component Value Date   WBC 8.0 03/16/2023   HGB 11.3 (L) 03/16/2023   HGB 12.3 02/18/2023   HCT 34.5 (L) 03/16/2023   HCT 38.4 02/18/2023   PLT 299 03/16/2023   PLT 347 02/18/2023   LYMPHOPCT 20 03/14/2023   MONOPCT 8 03/14/2023   EOSPCT 2 03/14/2023   BASOPCT 1  03/14/2023   CMP:  Lab Results  Component Value Date   NA 135 03/16/2023   NA 140 08/11/2022   K 3.8 03/16/2023   CL 106 03/16/2023   CO2 23 03/16/2023   BUN 32 (H) 03/16/2023   BUN 37 (H) 08/11/2022   CREATININE 2.78 (H) 03/16/2023   PROT 5.4 (L) 03/14/2023   PROT 6.3 04/20/2022   ALBUMIN 2.6 (L) 03/14/2023   ALBUMIN 3.1 (L) 04/20/2022   BILITOT 0.4 03/14/2023   BILITOT <0.2 04/20/2022   ALKPHOS 55 03/14/2023   AST 16 03/14/2023   ALT 17 03/14/2023   Total Discharge time is about 33 minutes  Shon Hale M.D on 03/16/2023 at 4:34 PM  Go to [email or web address] -  for contact info  [facility] - Office  [phone]

## 2023-03-16 NOTE — Plan of Care (Signed)
  Problem: Acute Rehab PT Goals(only PT should resolve) Goal: Patient Will Transfer Sit To/From Stand Flowsheets (Taken 03/16/2023 1009) Patient will transfer sit to/from stand: Independently Goal: Pt Will Ambulate Flowsheets (Taken 03/16/2023 1009) Pt will Ambulate:  Independently  > 125 feet Goal: Pt Will Go Up/Down Stairs Flowsheets (Taken 03/16/2023 1009) Pt will Go Up / Down Stairs:  3-5 stairs  Independently  Nelida Meuse PT, DPT Physical Therapist with [facility] [phone] office

## 2023-03-16 NOTE — Progress Notes (Addendum)
PHARMACY - ANTICOAGULATION CONSULT NOTE  Pharmacy Consult for Coumadin Indication: stroke  Allergies  Allergen Reactions   Visipaque [Iodixanol] Nausea And Vomiting    Patient had vagal response during procedure became hypertensive, and bradycardic. CO2 used during procedure prior to contrast being used, this may be cause as well. Recommended premedicating prior to contrast being used in the future.    Wellbutrin [Bupropion] Hives   Gabapentin     dizzy   Cefepime Rash    Tolerates penicilllin   Ciprofloxacin Hcl Hives and Rash    Hives/rash at injection site; 01/15/22 tolerated IV cipro   Tape Rash    Patient Measurements: Height: 5\' 5"  (165.1 cm) Weight: 98.5 kg (217 lb 2.5 oz) IBW/kg (Calculated) : 57   Vital Signs: Temp: 98.2 F (36.8 C) (11/13 0554) Temp Source: Oral (11/13 0554) BP: 142/84 (11/13 0554) Pulse Rate: 94 (11/13 0554)  Labs: Recent Labs    03/13/23 2213 03/13/23 2232 03/14/23 0625 03/14/23 1026 03/14/23 1725 03/15/23 0518 03/16/23 0537  HGB 11.5*  --  10.9*  --   --   --  11.3*  HCT 34.6*  --  33.5*  --   --   --  34.5*  PLT 295  --  293  --   --   --  299  LABPROT  --   --   --  32.7*  --  35.7* 31.6*  INR  --   --   --  3.2*  --  3.5* 3.0*  CREATININE  --    < > 3.44* 3.46* 3.63*  --  2.78*   < > = values in this interval not displayed.    Estimated Creatinine Clearance: 31.3 mL/min (A) (by C-G formula based on SCr of 2.78 mg/dL (H)).   Medical History: Past Medical History:  Diagnosis Date   Anemia    CAD (coronary artery disease)    a. s/p cath in 03/2014 showing 30% mid-LAD, moderate to severe disease along small D1, patent LCx, moderate to severe distal OM2 stenosis and moderate diffuse diease along RCA not amenable to PCI   CHF (congestive heart failure) (HCC)    a. EF 55-60% in 12/2019 b. EF at 35-40% by echo in 05/2020   CKD (chronic kidney disease) stage 4, GFR 15-29 ml/min (HCC)    Diabetes mellitus without complication (HCC)     Myocardial infarction (HCC)    Neuropathy    PVD (peripheral vascular disease) (HCC)    Stroke (HCC) 01/2022   L hand weakness    Medications:  See med rec  Assessment: 42 y.o. female with medical history significant of coronary artery disease, CHF, CKD, history of stroke, history of seizure, and more presents the ER with a chief complaint of seizure-like activity. Patient chronically anticoagulated with coumadin for previous ischemic stroke.  INR 3.2 > 3.5>3.0, therapeutic on upper end  Followed in anticoag clinic=> home dose 5mg  daily on Mon, Tues, Thurs, Friday and Sun; 2.5mg  on Wed and Sat  Goal of Therapy:  INR 2-3 Monitor platelets by anticoagulation protocol: Yes   Plan:  Warfarin 2.5 mg x 1 dose. Daily PT-INR Monitor for S/S of bleeding  Judeth Cornfield, PharmD Clinical Pharmacist 03/16/2023 7:53 AM

## 2023-03-16 NOTE — Progress Notes (Signed)
Discharge instructions given to patient for planned discharge home. Family en route to pick patient up. Patient verbalizes understanding of discharge instructions that were provided.

## 2023-03-16 NOTE — Evaluation (Signed)
Physical Therapy Evaluation Patient Details Name: Lauren [name] MRN: [id number] DOB: 1980/05/27 Today's Date: 03/16/2023  History of Present Illness  Patient is a 42 year old female with PMH of CAD, CHF CKD and history of stroke and seizure.  Presents to the ER on 03/13/2023 with complaint of seizure-like activity.  Per chart review and previous hospital admission course, patient with right black eye and reports this from a fall.  Patient has no memory of fall but waking up on the ground at home.  Reports increased blood glucose levels.  Per chart review patient is overall poor historian. EEG ordered 03/15/23.  Clinical Impression    Pt tolerated today's Physical Therapy Evaluation, well with good return for functional mobility.  Does show mild balance deficits in setting of previous stroke with L sided deficits, during functional mobility with ambulation functional transfers. Based upon these deficits/impairments, patient will benefit from continued skilled physical therapy services during remainder of hospital stay and at the next recommended venue of care to address deficits and promote return to optimal function.   Will keep patient on once a week for skilled PT services in acute setting due to medical diagnosis, previous CVA with left-sided deficits, and balance deficits.                  If plan is discharge home, recommend the following: A little help with walking and/or transfers;A little help with bathing/dressing/bathroom   Can travel by private vehicle        Equipment Recommendations None recommended by PT  Recommendations for Other Services       Functional Status Assessment Patient has not had a recent decline in their functional status     Precautions / Restrictions Precautions Precautions: Fall Restrictions Weight Bearing Restrictions: No      Mobility  Bed Mobility Overal bed mobility: Needs Assistance Bed Mobility: Sit to Supine       Sit to  supine: Independent   General bed mobility comments: Received sitting EOB.  Patient returned from sitting to supine independently with bed alarm on while therapist looked for recliner. Patient Response: Cooperative  Transfers Overall transfer level: Needs assistance Equipment used: None Transfers: Sit to/from Stand Sit to Stand: Supervision           General transfer comment: Supervision level for sit to stand from edge of bed, power up to stand with RUE on bed rail.    Ambulation/Gait Ambulation/Gait assistance: Supervision Gait Distance (Feet): 150 Feet Assistive device: None Gait Pattern/deviations: WFL(Within Functional Limits), Step-through pattern, Decreased step length - left, Decreased stride length, Decreased weight shift to left       General Gait Details: Supervision level for ambulation without AD, reduced step length through L side due to previous stroke deficits.  Slow bilateral turning but steady, no loss of balance.  Intermittently grabbing for rail with RUE.  Stairs Stairs:  (Patient deferred, reporting she has no concerns with navigating stairs at home.)          Wheelchair Mobility     Tilt Bed Tilt Bed Patient Response: Cooperative  Modified Rankin (Stroke Patients Only)       Balance Overall balance assessment: Needs assistance Sitting-balance support: No upper extremity supported Sitting balance-Leahy Scale: Good Sitting balance - Comments: Good/good with dynamic weight shifting in multiple planes sitting EOB.   Standing balance support: No upper extremity supported Standing balance-Leahy Scale: Good Standing balance comment: Good/good with standing without AD.  Pertinent Vitals/Pain Pain Assessment Pain Assessment: No/denies pain    Home Living Family/patient expects to be discharged to:: Private residence Living Arrangements: Spouse/significant other;Children Available Help at Discharge:  Family;Available 24 hours/day Type of Home: Mobile home Home Access: Stairs to enter;Ramped entrance Entrance Stairs-Rails: Right;Left Entrance Stairs-Number of Steps: 4 or 6   Home Layout: One level Home Equipment: BSC/3in1;Tub bench;Rolling Walker (2 wheels);Wheelchair - manual;Grab bars - tub/shower;Shower seat      Prior Function Prior Level of Function : Needs assist       Physical Assist : ADLs (physical)   ADLs (physical): Bathing Mobility Comments: Patient reports not utilizing AD with household ambulation.  Patient reports intermittently using AD and electric cart with community ambulation. ADLs Comments: Patient reporting inconsistencies with ADL history, reporting back-and-forth that she needs assistance and does not need assistance sometimes.     Extremity/Trunk Assessment   Upper Extremity Assessment Upper Extremity Assessment: Overall WFL for tasks assessed;LUE deficits/detail LUE Deficits / Details: 3/5 MMT shoulder flexion LUE Sensation: WNL    Lower Extremity Assessment Lower Extremity Assessment: RLE deficits/detail;LLE deficits/detail RLE Deficits / Details: 4+/5 knee extension and ankle dorsiflexion MMT RLE Sensation: WNL LLE Deficits / Details: 4 -/5 knee extension ankle dorsiflexion MMT LLE Sensation: decreased light touch       Communication   Communication Communication: No apparent difficulties Cueing Techniques: Verbal cues;Tactile cues  Cognition Arousal: Alert Behavior During Therapy: WFL for tasks assessed/performed Overall Cognitive Status: Within Functional Limits for tasks assessed                                          General Comments      Exercises     Assessment/Plan    PT Assessment Patient needs continued PT services  PT Problem List Decreased strength;Decreased range of motion;Decreased activity tolerance;Decreased balance;Decreased mobility       PT Treatment Interventions Gait training;Functional  mobility training;Therapeutic activities;Therapeutic exercise;Balance training;Neuromuscular re-education    PT Goals (Current goals can be found in the Care Plan section)  Acute Rehab PT Goals Patient Stated Goal: Go home and get outpatient rehab upon discharge PT Goal Formulation: With patient Time For Goal Achievement: 03/30/23    Frequency Min 1X/week     Co-evaluation               AM-PAC PT "6 Clicks" Mobility  Outcome Measure Help needed turning from your back to your side while in a flat bed without using bedrails?: None Help needed moving from lying on your back to sitting on the side of a flat bed without using bedrails?: None Help needed moving to and from a bed to a chair (including a wheelchair)?: None Help needed standing up from a chair using your arms (e.g., wheelchair or bedside chair)?: None Help needed to walk in hospital room?: A Little Help needed climbing 3-5 steps with a railing? : A Little 6 Click Score: 22    End of Session Equipment Utilized During Treatment: Gait belt Activity Tolerance: Patient tolerated treatment well Patient left: in chair;with call bell/phone within reach;with chair alarm set Nurse Communication: Mobility status PT Visit Diagnosis: Unsteadiness on feet (R26.81)    Time: 0272-5366 PT Time Calculation (min) (ACUTE ONLY): 23 min   Charges:   PT Evaluation $PT Eval Moderate Complexity: 1 Mod   PT General Charges $$ ACUTE PT VISIT: 1 Visit  Nelida Meuse PT, DPT Physical Therapist with [facility] 336 [phone] office  Nelida Meuse 03/16/2023, 10:05 AM

## 2023-03-17 ENCOUNTER — Telehealth (HOSPITAL_COMMUNITY): Payer: Self-pay | Admitting: Pharmacy Technician

## 2023-03-17 NOTE — Telephone Encounter (Signed)
Pharmacy Patient Advocate Encounter   Received notification from Fax that prior authorization for clonazePAM 1MG  dispersible tablets is required/requested.   Insurance verification completed.   The patient is insured through [facility] .   Per test claim: PA required; PA submitted to above mentioned insurance via CoverMyMeds Key/confirmation #/EOC [id number] Status is pending

## 2023-03-18 NOTE — Consult Note (Signed)
[facility] Liaison Note  03/18/2023  Lauren [name] November 15, 1980 [id number]  Location: RN Hospital Liaison screened the patient remotely at [facility].  Insurance: [employer]   Lauren [name] is a 42 y.o. female who is a Primary Care Patient of Billie Lade, MD[facility]. The patient was screened for readmission hospitalization with noted high risk score for unplanned readmission risk with 2 IP/1 ED in 6 months.  The patient was assessed for potential Care Management service needs for post hospital transition for care coordination. Review of patient's electronic medical record reveals patient was admitted for Seizures. Pt discharged the hospital on 03/16/23 for out-patient PT services. Due to high risk documentation will refer for TOC to follow up.   VBCI Care Management/Population Health does not replace or interfere with any arrangements made by the Inpatient Transition of Care team.   For questions contact:   [name], RN, [facility] Liaison [place]   [facility], Population Health Office Hours MTWF  8:00 am-6:00 pm Direct Dial: [phone] mobile [phone] [Office toll free line] Office Hours are M-F 8:30 - 5 pm [name]

## 2023-03-18 NOTE — Telephone Encounter (Signed)
Pharmacy Patient Advocate Encounter  Received notification from [facility] that Prior Authorization for clonazePAM 1MG  dispersible tablets  has been DENIED.  Must try CLonazepam tablets, clorazepate or Diazepam first.

## 2023-03-21 ENCOUNTER — Other Ambulatory Visit: Payer: Self-pay

## 2023-03-21 DIAGNOSIS — R569 Unspecified convulsions: Secondary | ICD-10-CM

## 2023-03-22 ENCOUNTER — Other Ambulatory Visit: Payer: Self-pay | Admitting: Cardiology

## 2023-03-22 ENCOUNTER — Telehealth: Payer: Self-pay | Admitting: *Deleted

## 2023-03-22 NOTE — Progress Notes (Signed)
  Care Coordination   Note   03/22/2023 Name: [name] MRN: [id number] DOB: 03/07/1981  Lauren [name] is a 42 y.o. year old female who sees Durwin Nora, Lucina Mellow, MD for primary care. I reached out to Toy Baker by phone today to offer care coordination services.  [name] was given information about Care Coordination services today including:   The Care Coordination services include support from the care team which includes your Nurse Coordinator, Clinical Social Worker, or Pharmacist.  The Care Coordination team is here to help remove barriers to the health concerns and goals most important to you. Care Coordination services are voluntary, and the patient may decline or stop services at any time by request to their care team member.   Care Coordination Consent Status: Patient agreed to services and verbal consent obtained.   Follow up plan:  Telephone appointment with care coordination team member scheduled for:  11/22  Encounter Outcome:  Patient Scheduled  [facility] Coordination Care Guide  Direct Dial: [phone]

## 2023-03-24 ENCOUNTER — Other Ambulatory Visit: Payer: Self-pay | Admitting: Internal Medicine

## 2023-03-24 ENCOUNTER — Telehealth: Payer: Self-pay | Admitting: Cardiology

## 2023-03-24 NOTE — Telephone Encounter (Signed)
Pt c/o medication issue:  1. Name of Medication:   warfarin (COUMADIN) 5 MG tablet   2. How are you currently taking this medication (dosage and times per day)?   3. Are you having a reaction (difficulty breathing--STAT)?   4. What is your medication issue?   Caller Apolinar Junes) wants confirmation that they can switch the brand of patient's medication as her former brand has been discontinued

## 2023-03-24 NOTE — Telephone Encounter (Signed)
Pt overdue for a coumadin visit. Called and spoke to pt made her aware that her warfarin manufacturer was changing and she would need to come in for an INR check. Scheduled pt to come in on 03/29/2023.   Called and spoke to [place] from [place] and made him aware it was okay for pt's warfarin manufacturer to change.

## 2023-03-25 ENCOUNTER — Ambulatory Visit: Payer: Self-pay | Admitting: *Deleted

## 2023-03-25 NOTE — Patient Outreach (Signed)
  Care Coordination   Follow Up Visit Note   08/05/2023 updated note for 03/25/23 Name: Lauren [name] MRN: [id number] DOB: [birth year]  Lauren [name] is a 42 y.o. year old female who sees Durwin Nora, Lucina Mellow, MD for primary care. I spoke with  Toy Baker by phone today.  What matters to the patients health and wellness today?  Seizure, fall with face & knee injury, price of klonopin, finances related to hospital bill  [place] on face on concrete out of aplastic chair - had seizure  Knee looking a better   Medicine cost will go to [place] with goodrx coupon to pay less than previously quotes $40-120 Finance department staff at [facility] told her bill was not high enough for [facility] financial assistance Permission given to RN CM to refer her to SW as needed - She may start working with her Frances [facility] SW    Goals Addressed               This Visit's Progress     Patient Stated     COMPLETED: seek help for coverage & financial resources [facility]) (pt-stated)   On track      Care Coordination Interventions: Interventions Today    Flowsheet Row Most Recent Value  Chronic Disease   Chronic disease during today's visit Other  [seizure, fall cost of clonopin, financial insecurity]  General Interventions   General Interventions Discussed/Reviewed General Interventions Reviewed, Doctor Visits, Holiday representative Visits Discussed/Reviewed Doctor Visits Reviewed, PCP, Specialist  PCP/Specialist Visits Compliance with follow-up visit  Exercise Interventions   Exercise Discussed/Reviewed Exercise Reviewed, Physical Activity  Physical Activity Discussed/Reviewed Physical Activity Reviewed  Education Interventions   Education Provided Provided Education  Provided Verbal Education On [employer], Medication  [good rx, [facility] financial assistance]  Mental Health Interventions   Mental Health Discussed/Reviewed Mental Health Reviewed, Coping Strategies  Pharmacy Interventions   Pharmacy  Dicussed/Reviewed Pharmacy Topics Reviewed, Affording Medications  Safety Interventions   Safety Discussed/Reviewed Safety Reviewed, Fall Risk, Home Safety  [fall recent with face & knee injury]  Home Safety Assistive Devices              SDOH assessments and interventions completed:  No     Care Coordination Interventions:  Yes, provided   Follow up plan: Follow up call scheduled for 07/01/23    Encounter Outcome:  Patient Visit Completed   Cala Bradford L. Noelle Penner, RN, BSN, CCM [place]  [facility] RN Care Manager Direct Dial: [phone]  Fax: [phone]

## 2023-03-25 NOTE — Patient Outreach (Signed)
  Care Coordination   03/25/2023 Name: Lauren [name] MRN: [id number] DOB: [birth year]   Care Coordination Outreach Attempts:  An unsuccessful telephone outreach was attempted today to offer the patient information about available care coordination services.  Follow Up Plan:  Additional outreach attempts will be made to offer the patient care coordination information and services.   Encounter Outcome:  No Answer   Care Coordination Interventions:  Yes, provided Attempts to reach patient via telephone, text and e mail today Her phone's voice mailbox is full    [name] L. Noelle Penner, RN, BSN, [facility]  VBCI Care Management Coordinator  [phone]  Fax: [phone]

## 2023-03-27 DIAGNOSIS — R8290 Unspecified abnormal findings in urine: Secondary | ICD-10-CM | POA: Insufficient documentation

## 2023-03-27 NOTE — Assessment & Plan Note (Signed)
Lipid panel updated earlier this year.  Total cholesterol 118 and LDL 57.  She is currently prescribed atorvastatin 40 mg daily.  No medication changes are indicated at this time.

## 2023-03-27 NOTE — Assessment & Plan Note (Signed)
She endorses urinary hesitancy and foul odor of urine and is concerned for UTI.  UA pending.

## 2023-03-27 NOTE — Assessment & Plan Note (Signed)
Remains adequately controlled on current antihypertensive regimen.  No medication changes are indicated at this time.

## 2023-03-27 NOTE — Assessment & Plan Note (Signed)
Followed by endocrinology.  A1c 8.3 on labs from June.  Endocrinology follow-up scheduled for 10/21. -Repeat urine microalbumin/creatinine ratio ordered today

## 2023-03-27 NOTE — Assessment & Plan Note (Signed)
Noted on labs from January.  She has completed high-dose, weekly vitamin D supplementation. -Repeat vitamin D level ordered today

## 2023-03-27 NOTE — Assessment & Plan Note (Signed)
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  She has recently established care with nephrology and is due for follow-up.

## 2023-03-27 NOTE — Assessment & Plan Note (Addendum)
She is currently prescribed levothyroxine 125 mcg daily.  TSH elevated (9.8) in June.  She is asymptomatic currently.  Repeat TSH/T4 ordered today.

## 2023-03-27 NOTE — Assessment & Plan Note (Signed)
Improved with increasing trazodone to 150 mg nightly

## 2023-03-27 NOTE — Assessment & Plan Note (Signed)
Scopolamine patches were recently discontinued due to concern for rash.  Metoclopramide 10 mg 4 times daily as needed for nausea relief was prescribed.  She states that these have been ineffective and she would like to resume scopolamine patches. -New order for scopolamine patches placed today

## 2023-03-27 NOTE — Assessment & Plan Note (Signed)
She remains on warfarin/Plavix and statin therapy.

## 2023-03-29 ENCOUNTER — Ambulatory Visit: Payer: BC Managed Care – PPO | Attending: Cardiology | Admitting: *Deleted

## 2023-03-29 DIAGNOSIS — I639 Cerebral infarction, unspecified: Secondary | ICD-10-CM | POA: Diagnosis not present

## 2023-03-29 DIAGNOSIS — Z5181 Encounter for therapeutic drug level monitoring: Secondary | ICD-10-CM | POA: Diagnosis not present

## 2023-03-29 LAB — POCT INR: INR: 3.1 — AB (ref 2.0–3.0)

## 2023-03-29 NOTE — Patient Instructions (Signed)
Decrease warfarin to 1 tablet daily except 1/2 tablet on Tuesdays, Thursdays and Saturdays Recheck in 3 wks

## 2023-04-05 ENCOUNTER — Other Ambulatory Visit (HOSPITAL_COMMUNITY): Payer: Self-pay

## 2023-04-05 ENCOUNTER — Other Ambulatory Visit: Payer: Self-pay | Admitting: Cardiology

## 2023-04-06 ENCOUNTER — Other Ambulatory Visit: Payer: Self-pay | Admitting: *Deleted

## 2023-04-06 ENCOUNTER — Other Ambulatory Visit (HOSPITAL_COMMUNITY): Payer: Self-pay

## 2023-04-06 DIAGNOSIS — I639 Cerebral infarction, unspecified: Secondary | ICD-10-CM

## 2023-04-07 ENCOUNTER — Other Ambulatory Visit (HOSPITAL_COMMUNITY): Payer: Self-pay

## 2023-04-08 ENCOUNTER — Inpatient Hospital Stay: Payer: BC Managed Care – PPO | Attending: Internal Medicine

## 2023-04-08 ENCOUNTER — Inpatient Hospital Stay: Payer: BC Managed Care – PPO | Admitting: Hematology and Oncology

## 2023-04-09 ENCOUNTER — Other Ambulatory Visit: Payer: Self-pay | Admitting: Internal Medicine

## 2023-04-19 ENCOUNTER — Ambulatory Visit: Payer: BC Managed Care – PPO | Attending: Cardiology | Admitting: *Deleted

## 2023-04-19 ENCOUNTER — Ambulatory Visit (INDEPENDENT_AMBULATORY_CARE_PROVIDER_SITE_OTHER): Payer: BC Managed Care – PPO | Admitting: Podiatry

## 2023-04-19 DIAGNOSIS — I639 Cerebral infarction, unspecified: Secondary | ICD-10-CM

## 2023-04-19 DIAGNOSIS — Z91199 Patient's noncompliance with other medical treatment and regimen due to unspecified reason: Secondary | ICD-10-CM

## 2023-04-19 DIAGNOSIS — Z5181 Encounter for therapeutic drug level monitoring: Secondary | ICD-10-CM

## 2023-04-19 LAB — POCT INR: INR: 1.4 — AB (ref 2.0–3.0)

## 2023-04-19 NOTE — Patient Instructions (Signed)
Take warfarin 1 1/2 tablets today then resume 1 tablet daily except 1/2 tablet on Tuesdays, Thursdays and Saturdays Ran out of warfarin and used Lovenox shots she had at home.  Has been back on warfarin about a week. Recheck in 3 wks

## 2023-04-20 NOTE — Progress Notes (Signed)
Patient was no-show for appointment today 

## 2023-04-21 ENCOUNTER — Emergency Department (HOSPITAL_COMMUNITY): Payer: BC Managed Care – PPO

## 2023-04-21 ENCOUNTER — Inpatient Hospital Stay (HOSPITAL_COMMUNITY)
Admission: EM | Admit: 2023-04-21 | Discharge: 2023-04-24 | DRG: 637 | Disposition: A | Discharge status: Home / Self Care | Payer: BC Managed Care – PPO | Attending: Internal Medicine | Admitting: Internal Medicine

## 2023-04-21 ENCOUNTER — Encounter (HOSPITAL_COMMUNITY): Payer: Self-pay

## 2023-04-21 ENCOUNTER — Other Ambulatory Visit: Payer: Self-pay

## 2023-04-21 DIAGNOSIS — E66813 Obesity, class 3: Secondary | ICD-10-CM | POA: Insufficient documentation

## 2023-04-21 DIAGNOSIS — E1165 Type 2 diabetes mellitus with hyperglycemia: Secondary | ICD-10-CM | POA: Diagnosis not present

## 2023-04-21 DIAGNOSIS — Z7901 Long term (current) use of anticoagulants: Secondary | ICD-10-CM

## 2023-04-21 DIAGNOSIS — D539 Nutritional anemia, unspecified: Secondary | ICD-10-CM | POA: Diagnosis present

## 2023-04-21 DIAGNOSIS — Z881 Allergy status to other antibiotic agents status: Secondary | ICD-10-CM

## 2023-04-21 DIAGNOSIS — G40419 Other generalized epilepsy and epileptic syndromes, intractable, without status epilepticus: Secondary | ICD-10-CM

## 2023-04-21 DIAGNOSIS — D531 Other megaloblastic anemias, not elsewhere classified: Secondary | ICD-10-CM

## 2023-04-21 DIAGNOSIS — R7989 Other specified abnormal findings of blood chemistry: Secondary | ICD-10-CM | POA: Insufficient documentation

## 2023-04-21 DIAGNOSIS — Z66 Do not resuscitate: Secondary | ICD-10-CM | POA: Diagnosis present

## 2023-04-21 DIAGNOSIS — N185 Chronic kidney disease, stage 5: Secondary | ICD-10-CM | POA: Diagnosis present

## 2023-04-21 DIAGNOSIS — E1022 Type 1 diabetes mellitus with diabetic chronic kidney disease: Secondary | ICD-10-CM | POA: Diagnosis present

## 2023-04-21 DIAGNOSIS — G9341 Metabolic encephalopathy: Secondary | ICD-10-CM | POA: Insufficient documentation

## 2023-04-21 DIAGNOSIS — Z91048 Other nonmedicinal substance allergy status: Secondary | ICD-10-CM

## 2023-04-21 DIAGNOSIS — E1110 Type 2 diabetes mellitus with ketoacidosis without coma: Secondary | ICD-10-CM | POA: Diagnosis present

## 2023-04-21 DIAGNOSIS — E871 Hypo-osmolality and hyponatremia: Secondary | ICD-10-CM | POA: Diagnosis present

## 2023-04-21 DIAGNOSIS — I5032 Chronic diastolic (congestive) heart failure: Secondary | ICD-10-CM | POA: Diagnosis present

## 2023-04-21 DIAGNOSIS — E46 Unspecified protein-calorie malnutrition: Secondary | ICD-10-CM | POA: Diagnosis present

## 2023-04-21 DIAGNOSIS — D509 Iron deficiency anemia, unspecified: Secondary | ICD-10-CM | POA: Diagnosis present

## 2023-04-21 DIAGNOSIS — N390 Urinary tract infection, site not specified: Secondary | ICD-10-CM | POA: Diagnosis present

## 2023-04-21 DIAGNOSIS — K219 Gastro-esophageal reflux disease without esophagitis: Secondary | ICD-10-CM | POA: Diagnosis present

## 2023-04-21 DIAGNOSIS — Z8673 Personal history of transient ischemic attack (TIA), and cerebral infarction without residual deficits: Secondary | ICD-10-CM

## 2023-04-21 DIAGNOSIS — I950 Idiopathic hypotension: Secondary | ICD-10-CM | POA: Diagnosis not present

## 2023-04-21 DIAGNOSIS — I252 Old myocardial infarction: Secondary | ICD-10-CM

## 2023-04-21 DIAGNOSIS — Z794 Long term (current) use of insulin: Secondary | ICD-10-CM

## 2023-04-21 DIAGNOSIS — I132 Hypertensive heart and chronic kidney disease with heart failure and with stage 5 chronic kidney disease, or end stage renal disease: Secondary | ICD-10-CM | POA: Diagnosis present

## 2023-04-21 DIAGNOSIS — E0810 Diabetes mellitus due to underlying condition with ketoacidosis without coma: Secondary | ICD-10-CM

## 2023-04-21 DIAGNOSIS — Z7902 Long term (current) use of antithrombotics/antiplatelets: Secondary | ICD-10-CM

## 2023-04-21 DIAGNOSIS — E872 Acidosis, unspecified: Secondary | ICD-10-CM

## 2023-04-21 DIAGNOSIS — Z9641 Presence of insulin pump (external) (internal): Secondary | ICD-10-CM | POA: Diagnosis present

## 2023-04-21 DIAGNOSIS — D638 Anemia in other chronic diseases classified elsewhere: Secondary | ICD-10-CM | POA: Diagnosis present

## 2023-04-21 DIAGNOSIS — Z888 Allergy status to other drugs, medicaments and biological substances status: Secondary | ICD-10-CM

## 2023-04-21 DIAGNOSIS — Z8249 Family history of ischemic heart disease and other diseases of the circulatory system: Secondary | ICD-10-CM

## 2023-04-21 DIAGNOSIS — E1051 Type 1 diabetes mellitus with diabetic peripheral angiopathy without gangrene: Secondary | ICD-10-CM | POA: Diagnosis present

## 2023-04-21 DIAGNOSIS — R799 Abnormal finding of blood chemistry, unspecified: Secondary | ICD-10-CM

## 2023-04-21 DIAGNOSIS — E1010 Type 1 diabetes mellitus with ketoacidosis without coma: Principal | ICD-10-CM | POA: Diagnosis present

## 2023-04-21 DIAGNOSIS — Z9049 Acquired absence of other specified parts of digestive tract: Secondary | ICD-10-CM

## 2023-04-21 DIAGNOSIS — I2510 Atherosclerotic heart disease of native coronary artery without angina pectoris: Secondary | ICD-10-CM | POA: Diagnosis present

## 2023-04-21 DIAGNOSIS — D72829 Elevated white blood cell count, unspecified: Secondary | ICD-10-CM | POA: Insufficient documentation

## 2023-04-21 DIAGNOSIS — Z9851 Tubal ligation status: Secondary | ICD-10-CM

## 2023-04-21 DIAGNOSIS — R569 Unspecified convulsions: Secondary | ICD-10-CM | POA: Diagnosis present

## 2023-04-21 DIAGNOSIS — M7989 Other specified soft tissue disorders: Secondary | ICD-10-CM | POA: Diagnosis not present

## 2023-04-21 DIAGNOSIS — E875 Hyperkalemia: Secondary | ICD-10-CM | POA: Diagnosis present

## 2023-04-21 DIAGNOSIS — N179 Acute kidney failure, unspecified: Secondary | ICD-10-CM | POA: Diagnosis present

## 2023-04-21 DIAGNOSIS — Z87891 Personal history of nicotine dependence: Secondary | ICD-10-CM

## 2023-04-21 DIAGNOSIS — Z6841 Body Mass Index (BMI) 40.0 and over, adult: Secondary | ICD-10-CM

## 2023-04-21 DIAGNOSIS — Z79899 Other long term (current) drug therapy: Secondary | ICD-10-CM

## 2023-04-21 DIAGNOSIS — E8809 Other disorders of plasma-protein metabolism, not elsewhere classified: Secondary | ICD-10-CM | POA: Diagnosis present

## 2023-04-21 DIAGNOSIS — Z79890 Hormone replacement therapy (postmenopausal): Secondary | ICD-10-CM

## 2023-04-21 DIAGNOSIS — N178 Other acute kidney failure: Secondary | ICD-10-CM

## 2023-04-21 DIAGNOSIS — I4581 Long QT syndrome: Secondary | ICD-10-CM

## 2023-04-21 DIAGNOSIS — I959 Hypotension, unspecified: Secondary | ICD-10-CM | POA: Diagnosis present

## 2023-04-21 DIAGNOSIS — R9431 Abnormal electrocardiogram [ECG] [EKG]: Secondary | ICD-10-CM | POA: Insufficient documentation

## 2023-04-21 DIAGNOSIS — E039 Hypothyroidism, unspecified: Secondary | ICD-10-CM | POA: Diagnosis present

## 2023-04-21 DIAGNOSIS — I10 Essential (primary) hypertension: Secondary | ICD-10-CM | POA: Diagnosis present

## 2023-04-21 DIAGNOSIS — Z89422 Acquired absence of other left toe(s): Secondary | ICD-10-CM

## 2023-04-21 LAB — CBG MONITORING, ED
Glucose-Capillary: 533 mg/dL (ref 70–99)
Glucose-Capillary: 590 mg/dL (ref 70–99)
Glucose-Capillary: >600 mg/dL (ref 70–99)
Glucose-Capillary: >600 mg/dL (ref 70–99)
Glucose-Capillary: >600 mg/dL (ref 70–99)
Glucose-Capillary: >600 mg/dL (ref 70–99)
Glucose-Capillary: >600 mg/dL (ref 70–99)
Glucose-Capillary: >600 mg/dL (ref 70–99)
Glucose-Capillary: >600 mg/dL (ref 70–99)

## 2023-04-21 LAB — BASIC METABOLIC PANEL
Anion gap: 22 — ABNORMAL HIGH (ref 5–15)
BUN: 40 mg/dL — ABNORMAL HIGH (ref 6–20)
BUN: 40 mg/dL — ABNORMAL HIGH (ref 6–20)
CO2: <7 mmol/L — ABNORMAL LOW (ref 22–32)
CO2: 9 mmol/L — ABNORMAL LOW (ref 22–32)
Calcium: 7.7 mg/dL — ABNORMAL LOW (ref 8.9–10.3)
Calcium: 7.8 mg/dL — ABNORMAL LOW (ref 8.9–10.3)
Chloride: 96 mmol/L — ABNORMAL LOW (ref 98–111)
Chloride: 97 mmol/L — ABNORMAL LOW (ref 98–111)
Creatinine, Ser: 4.67 mg/dL — ABNORMAL HIGH (ref 0.44–1.00)
Creatinine, Ser: 4.74 mg/dL — ABNORMAL HIGH (ref 0.44–1.00)
GFR, Estimated: 11 mL/min — ABNORMAL LOW (ref >60)
GFR, Estimated: 11 mL/min — ABNORMAL LOW (ref >60)
Glucose, Bld: 758 mg/dL (ref 70–99)
Glucose, Bld: 652 mg/dL (ref 70–99)
Potassium: 4.9 mmol/L (ref 3.5–5.1)
Potassium: 4.7 mmol/L (ref 3.5–5.1)
Sodium: 127 mmol/L — ABNORMAL LOW (ref 135–145)
Sodium: 128 mmol/L — ABNORMAL LOW (ref 135–145)

## 2023-04-21 LAB — COMPREHENSIVE METABOLIC PANEL
ALT: 23 U/L (ref 0–44)
AST: 83 U/L — ABNORMAL HIGH (ref 15–41)
Albumin: 2.9 g/dL — ABNORMAL LOW (ref 3.5–5.0)
Alkaline Phosphatase: 67 U/L (ref 38–126)
BUN: 39 mg/dL — ABNORMAL HIGH (ref 6–20)
CO2: <7 mmol/L — ABNORMAL LOW (ref 22–32)
Calcium: 8.2 mg/dL — ABNORMAL LOW (ref 8.9–10.3)
Chloride: 92 mmol/L — ABNORMAL LOW (ref 98–111)
Creatinine, Ser: 4.6 mg/dL — ABNORMAL HIGH (ref 0.44–1.00)
GFR, Estimated: 12 mL/min — ABNORMAL LOW (ref >60)
Glucose, Bld: 888 mg/dL (ref 70–99)
Potassium: 5.6 mmol/L — ABNORMAL HIGH (ref 3.5–5.1)
Sodium: 125 mmol/L — ABNORMAL LOW (ref 135–145)
Total Bilirubin: 1.5 mg/dL — ABNORMAL HIGH (ref <1.2)
Total Protein: 6.1 g/dL — ABNORMAL LOW (ref 6.5–8.1)

## 2023-04-21 LAB — PROTIME-INR
INR: 3.1 — ABNORMAL HIGH (ref 0.8–1.2)
Prothrombin Time: 32.4 s — ABNORMAL HIGH (ref 11.4–15.2)

## 2023-04-21 LAB — LACTIC ACID, PLASMA
Lactic Acid, Venous: 3.1 mmol/L (ref 0.5–1.9)
Lactic Acid, Venous: 3.5 mmol/L (ref 0.5–1.9)
Lactic Acid, Venous: 2.8 mmol/L (ref 0.5–1.9)

## 2023-04-21 LAB — I-STAT CHEM 8, ED
BUN: 31 mg/dL — ABNORMAL HIGH (ref 6–20)
Calcium, Ion: 1.03 mmol/L — ABNORMAL LOW (ref 1.15–1.40)
Chloride: 103 mmol/L (ref 98–111)
Creatinine, Ser: 4.2 mg/dL — ABNORMAL HIGH (ref 0.44–1.00)
Glucose, Bld: >700 mg/dL (ref 70–99)
HCT: 37 % (ref 36.0–46.0)
Hemoglobin: 12.6 g/dL (ref 12.0–15.0)
Potassium: 4.9 mmol/L (ref 3.5–5.1)
Sodium: 129 mmol/L — ABNORMAL LOW (ref 135–145)
TCO2: 7 mmol/L — ABNORMAL LOW (ref 22–32)

## 2023-04-21 LAB — BLOOD GAS, VENOUS
Acid-base deficit: 23.1 mmol/L — ABNORMAL HIGH (ref 0.0–2.0)
Bicarbonate: 5.2 mmol/L — ABNORMAL LOW (ref 20.0–28.0)
Drawn by: 7724
O2 Saturation: 80.2 %
Patient temperature: 36.4
pCO2, Ven: 18 mm[Hg] — CL (ref 44–60)
pH, Ven: 7.08 — CL (ref 7.25–7.43)
pO2, Ven: 52 mm[Hg] — ABNORMAL HIGH (ref 32–45)

## 2023-04-21 LAB — CBC
HCT: 38.3 % (ref 36.0–46.0)
Hemoglobin: 11.5 g/dL — ABNORMAL LOW (ref 12.0–15.0)
MCH: 30.2 pg (ref 26.0–34.0)
MCHC: 30 g/dL (ref 30.0–36.0)
MCV: 100.5 fL — ABNORMAL HIGH (ref 80.0–100.0)
Platelets: 351 10*3/uL (ref 150–400)
RBC: 3.81 MIL/uL — ABNORMAL LOW (ref 3.87–5.11)
RDW: 14 % (ref 11.5–15.5)
WBC: 14.3 10*3/uL — ABNORMAL HIGH (ref 4.0–10.5)
nRBC: 0 % (ref 0.0–0.2)

## 2023-04-21 LAB — TSH: TSH: 2.105 u[IU]/mL (ref 0.350–4.500)

## 2023-04-21 LAB — BETA-HYDROXYBUTYRIC ACID: Beta-Hydroxybutyric Acid: >8 mmol/L — ABNORMAL HIGH (ref 0.05–0.27)

## 2023-04-21 MED ORDER — INSULIN REGULAR(HUMAN) IN NACL 100-0.9 UT/100ML-% IV SOLN
INTRAVENOUS | Status: DC
  Administered 2023-04-21: 8 [IU]/h via INTRAVENOUS
  Administered 2023-04-22: 6 [IU]/h via INTRAVENOUS
  Administered 2023-04-22: 4.2 [IU]/h via INTRAVENOUS
  Filled 2023-04-21 (×2): qty 100

## 2023-04-21 MED ORDER — GLUCERNA SHAKE PO LIQD
237.0000 mL | Freq: Three times a day (TID) | ORAL | Status: DC
  Administered 2023-04-22 (×2): 237 mL via ORAL
  Filled 2023-04-21 (×9): qty 237

## 2023-04-21 MED ORDER — DEXTROSE IN LACTATED RINGERS 5 % IV SOLN: INTRAVENOUS | Status: DC

## 2023-04-21 MED ORDER — LEVOTHYROXINE SODIUM 125 MCG PO TABS: 125.0000 ug | ORAL_TABLET | Freq: Every day | ORAL | Status: DC

## 2023-04-21 MED ORDER — LACTATED RINGERS IV BOLUS
20.0000 mL/kg | Freq: Once | INTRAVENOUS | Status: AC
  Administered 2023-04-21: 2630 mL via INTRAVENOUS

## 2023-04-21 MED ORDER — INSULIN ASPART 100 UNIT/ML IJ SOLN
10.0000 [IU] | Freq: Once | INTRAMUSCULAR | Status: AC
  Administered 2023-04-21: 10 [IU] via INTRAVENOUS
  Filled 2023-04-21: qty 1

## 2023-04-21 MED ORDER — WARFARIN SODIUM 2.5 MG PO TABS: 2.5000 mg | ORAL_TABLET | ORAL | Status: DC

## 2023-04-21 MED ORDER — PROCHLORPERAZINE EDISYLATE 10 MG/2ML IJ SOLN: 10.0000 mg | Freq: Four times a day (QID) | INTRAMUSCULAR | Status: DC | PRN

## 2023-04-21 MED ORDER — WARFARIN SODIUM 5 MG PO TABS: 5.0000 mg | ORAL_TABLET | ORAL | Status: DC

## 2023-04-21 MED ORDER — ACETAMINOPHEN 325 MG PO TABS: 650.0000 mg | ORAL_TABLET | Freq: Four times a day (QID) | ORAL | Status: DC | PRN

## 2023-04-21 MED ORDER — WARFARIN - PHYSICIAN DOSING INPATIENT: Freq: Every day | Status: DC

## 2023-04-21 MED ORDER — ACETAMINOPHEN 650 MG RE SUPP: 650.0000 mg | Freq: Four times a day (QID) | RECTAL | Status: DC | PRN

## 2023-04-21 MED ORDER — STERILE WATER FOR INJECTION IV SOLN
INTRAVENOUS | Status: DC
  Filled 2023-04-21 (×4): qty 1000

## 2023-04-21 MED ORDER — LACTATED RINGERS IV BOLUS
1000.0000 mL | Freq: Once | INTRAVENOUS | Status: AC
  Administered 2023-04-21: 1000 mL via INTRAVENOUS

## 2023-04-21 MED ORDER — POTASSIUM CHLORIDE 10 MEQ/100ML IV SOLN
10.0000 meq | INTRAVENOUS | Status: AC
  Administered 2023-04-21 (×2): 10 meq via INTRAVENOUS
  Filled 2023-04-21 (×2): qty 100

## 2023-04-21 MED ORDER — PANTOPRAZOLE SODIUM 40 MG IV SOLR
40.0000 mg | INTRAVENOUS | Status: DC
  Administered 2023-04-21: 40 mg via INTRAVENOUS
  Filled 2023-04-21: qty 10

## 2023-04-21 MED ORDER — LACTATED RINGERS IV SOLN: INTRAVENOUS | Status: DC

## 2023-04-21 MED ORDER — DEXTROSE 50 % IV SOLN: 0.0000 mL | INTRAVENOUS | Status: DC | PRN

## 2023-04-21 NOTE — ED Notes (Signed)
Per EMS STEMI on their 12 lead

## 2023-04-21 NOTE — ED Triage Notes (Signed)
Pt states she called EMS for headache/ 10/10.  BLG "HI" EMS BP 83/46  Insulin pump ran out last night

## 2023-04-21 NOTE — ED Notes (Signed)
This RN and NT Amil Amen bladder scanned patient due to large volume of IVF received by patient and no urinary output. Volume via bladder scanner stated 355 mls. Notified Dr. Thomes Dinning of same.

## 2023-04-21 NOTE — H&P (Addendum)
History and Physical    Patient: Lauren [name] WUJ:[id number] DOB: [birth year] DOA: 04/21/2023 DOS: the patient was seen and examined on 04/22/2023 PCP: Billie Lade, MD  Patient coming from: Home  Chief Complaint:  Chief Complaint  Patient presents with   Hyperglycemia   Hypotension   HPI: Espen Midgley is a 42 y.o. female with medical history significant of CAD, HFpEF, T2DM, CKD stage 4, history of stroke and history of seizure who presents to the emergency department from home via EMS due to headache.  Patient was unable to provide history possibly due to altered mental status, history was obtained from ED physician and medical record.  Per report, EMS was activated due to patient's headache, on arrival of EMS team, patient was noted to have hypotension with a BP of 83/46, blood glucose level showed "HI".  It was reported that patient's insulin pump broke last night.  She was taken to the ED for further evaluation and management.  ED Course:  In the emergency department, respiratory rate was 35, though this eventually improved to 19.  Temperature 0.5 F, BP 70/37, other vital signs were within normal range.  Workup in the ED showed leukocytosis and microcytic anemia, BMP showed hyponatremia with sodium of 125, potassium 5.6, chloride 92, bicarb less than 7, blood glucose 888, BUN 9, creatinine 4.60, calcium 8.2, albumin 2.9, AST 83, ALT 23, ALP 67, total bilirubin 1.5, EGFR 12, TSH 2.105.  Blood culture pending. CT of head without contrast showed no evidence of acute intracranial abnormality 10 units of IV NovoLog was initially given, patient was then started on insulin drip per Endo tool. Hospitalist was asked to admit patient for further evaluation and management.  Review of Systems: Review of systems as noted in the HPI. All other systems reviewed and are negative.   Past Medical History:  Diagnosis Date   Anemia    CAD (coronary artery disease)    a. s/p cath in 03/2014  showing 30% mid-LAD, moderate to severe disease along small D1, patent LCx, moderate to severe distal OM2 stenosis and moderate diffuse diease along RCA not amenable to PCI   CHF (congestive heart failure) (HCC)    a. EF 55-60% in 12/2019 b. EF at 35-40% by echo in 05/2020   CKD (chronic kidney disease) stage 4, GFR 15-29 ml/min (HCC)    Diabetes mellitus without complication (HCC)    Myocardial infarction (HCC)    Neuropathy    PVD (peripheral vascular disease) (HCC)    Stroke (HCC) 01/2022   L hand weakness   Past Surgical History:  Procedure Laterality Date   ABDOMINAL AORTOGRAM W/LOWER EXTREMITY N/A 06/10/2022   Procedure: ABDOMINAL AORTOGRAM W/LOWER EXTREMITY;  Surgeon: Cephus Shelling, MD;  Location: [facility];  Service: Cardiovascular;  Laterality: N/A;   AMPUTATION Left 09/02/2021   Procedure: AMPUTATION RAY;  Surgeon: Edwin Cap, DPM;  Location: [facility];  Service: Podiatry;  Laterality: Left;  sagittal saw, 3L bag saline & Pulse   Cardiac catherization     CHOLECYSTECTOMY     I & D EXTREMITY Left 08/30/2021   Procedure: IRRIGATION AND DEBRIDEMENT WITH BONE BIOPSY;  Surgeon: Candelaria Stagers, DPM;  Location: [facility];  Service: Podiatry;  Laterality: Left;   IR ANGIO VERTEBRAL SEL SUBCLAVIAN INNOMINATE UNI L MOD SED  01/18/2022   IR CT HEAD LTD  01/08/2022   IR INTRA CRAN STENT  01/08/2022   IR PERCUTANEOUS ART THROMBECTOMY/INFUSION INTRACRANIAL INC DIAG ANGIO  01/08/2022  IRRIGATION AND DEBRIDEMENT FOOT Left 09/04/2021   Procedure: IRRIGATION AND DEBRIDEMENT FOOT AND CLOSURE;  Surgeon: Edwin Cap, DPM;  Location: [facility];  Service: Podiatry;  Laterality: Left;   LEFT HEART CATH AND CORONARY ANGIOGRAPHY N/A 07/26/2022   Procedure: LEFT HEART CATH AND CORONARY ANGIOGRAPHY;  Surgeon: [place], Peter M, MD;  Location: [facility];  Service: Cardiovascular;  Laterality: N/A;   METATARSAL OSTEOTOMY Left 07/21/2022   Procedure: LEFT FOOT EXCISION OF FIFTH METATARSAL WITH  DEBRIDEMENT OF ULCER, POSSIBLE BONE BIOPSY OF THE FOURTH METATARSAL, POSSIBLE FOURTH RAY AMPUTATION OF LEFT TOEAND METATARSAL;  Surgeon: Vivi Barrack, DPM;  Location: [facility];  Service: Podiatry;  Laterality: Left;   PERIPHERAL VASCULAR BALLOON ANGIOPLASTY Left 06/10/2022   Procedure: PERIPHERAL VASCULAR BALLOON ANGIOPLASTY;  Surgeon: Cephus Shelling, MD;  Location: [facility];  Service: Cardiovascular;  Laterality: Left;  AT and DP   PERIPHERAL VASCULAR INTERVENTION Left 06/10/2022   Procedure: PERIPHERAL VASCULAR INTERVENTION;  Surgeon: Cephus Shelling, MD;  Location: [facility] INVASIVE CV LAB;  Service: Cardiovascular;  Laterality: Left;  SFA   RADIOLOGY WITH ANESTHESIA N/A 01/08/2022   Procedure: IR WITH ANESTHESIA;  Surgeon: Radiologist, Medication, MD;  Location: [facility];  Service: Radiology;  Laterality: N/A;   THROMBECTOMY BRACHIAL ARTERY Right 11/18/2021   Procedure: RIGHT BRACHIAL, RADIAL, & ULNAR ARTERY THROMBECTOMY.;  Surgeon: Cephus Shelling, MD;  Location: [facility];  Service: Vascular;  Laterality: Right;   TUBAL LIGATION      Social History:  reports that she quit smoking about 15 months ago. Her smoking use included cigarettes. She started smoking about 16 years ago. She has a 15 pack-year smoking history. She has been exposed to tobacco smoke. She has never used smokeless tobacco. She reports current alcohol use. She reports that she does not use drugs.   Allergies  Allergen Reactions   Visipaque [Iodixanol] Nausea And Vomiting    Patient had vagal response during procedure became hypertensive, and bradycardic. CO2 used during procedure prior to contrast being used, this may be cause as well. Recommended premedicating prior to contrast being used in the future.    Wellbutrin [Bupropion] Hives   Gabapentin     dizzy   Cefepime Rash    Tolerates penicilllin   Ciprofloxacin Hcl Hives and Rash    Hives/rash at injection site; 01/15/22 tolerated IV cipro   Tape Rash     Family History  Problem Relation Age of Onset   Thyroid disease Mother    Hypertension Mother    Hyperlipidemia Mother    CAD Mother    CVA Mother    Hyperlipidemia Father    Hypertension Father    CAD Father    Breast cancer Paternal Grandmother 74   Cancer Paternal Grandmother        lung     Prior to Admission medications   Medication Sig Start Date End Date Taking? Authorizing Provider  traZODone (DESYREL) 100 MG tablet Take 100 mg by mouth at bedtime. 04/05/23  Yes [provider]  acetaminophen (TYLENOL) 325 MG tablet Take 1-2 tablets (325-650 mg total) by mouth every 4 (four) hours as needed for mild pain. Patient taking differently: Take 650 mg by mouth as needed for mild pain (pain score 1-3). 02/18/22   Love, Evlyn Kanner, PA-C  atorvastatin (LIPITOR) 40 MG tablet TAKE 1 TABLET BY MOUTH ONCE DAILY. 01/25/23   Billie Lade, MD  clonazePAM (KLONOPIN) 1 MG disintegrating tablet Take 1 tablet (1  mg total) by mouth 2 (two) times daily as needed for seizure (For seizure lasting more than 2 minutes.). 03/16/23   Shon Hale, MD  clopidogrel (PLAVIX) 75 MG tablet Take 1 tablet (75 mg total) by mouth daily. 04/29/22   Micki Riley, MD  Continuous Glucose Sensor (DEXCOM G6 SENSOR) MISC CHANGE SENSOR EVERY 10 DAYS. 03/14/23   Dani Gobble, NP  Continuous Glucose Transmitter (DEXCOM G6 TRANSMITTER) MISC Change transmitter every 90 days as directed. 01/20/23   Roma Kayser, MD  furosemide (LASIX) 40 MG tablet TAKE 1 TABLET BY MOUTH ONCE DAILY AS NEEDED FOR  SWELLING 03/22/23   Antoine Poche, MD  hydrALAZINE (APRESOLINE) 25 MG tablet Take 1 tablet (25 mg total) by mouth every 8 (eight) hours. Patient taking differently: Take 25 mg by mouth in the morning and at bedtime. 11/12/22   Noralee Stain, DO  insulin aspart (NOVOLOG) 100 UNIT/ML injection Use with Omnipod for daily dose around 100 units daily Patient taking differently: Inject 0-100 Units into  the skin See admin instructions. Use with Omnipod 10/18/22   Dani Gobble, NP  Insulin Disposable Pump (OMNIPOD 5 [id number] PODS GEN 5) MISC Change pod every 48-72 hours. 11/11/22   Dani Gobble, NP  isosorbide mononitrate (IMDUR) 60 MG 24 hr tablet TAKE 2 TABLETS BY MOUTH ONCE DAILY. 02/21/23   Billie Lade, MD  levothyroxine (SYNTHROID) 125 MCG tablet Take 1 tablet (125 mcg total) by mouth daily at 6 (six) AM. 06/17/22 06/12/23  Dani Gobble, NP  metoCLOPramide (REGLAN) 10 MG tablet Take 10 mg by mouth 4 (four) times daily. 02/24/23   [provider]  metoprolol succinate (TOPROL-XL) 50 MG 24 hr tablet Take 1 tablet by mouth once daily 04/05/23   Antoine Poche, MD  Multiple Vitamin (MULTIVITAMIN WITH MINERALS) TABS tablet Take 1 tablet by mouth daily. 02/18/22   Love, Evlyn Kanner, PA-C  nitroGLYCERIN (NITROSTAT) 0.4 MG SL tablet Place 1 tablet (0.4 mg total) under the tongue every 5 (five) minutes x 3 doses as needed for chest pain. 02/18/22   Love, Evlyn Kanner, PA-C  oxyCODONE-acetaminophen (PERCOCET) 7.5-325 MG tablet Take 1 tablet by mouth daily as needed for severe pain. 02/08/23   Fanny Dance, MD  pantoprazole (PROTONIX) 40 MG tablet Take 1 tablet (40 mg total) by mouth daily. Courtesy fill/ pt to get established with a primary MD for further refills Patient taking differently: Take 40 mg by mouth daily as needed (reflux). 06/01/22 03/14/23  Billie Lade, MD  ranolazine (RANEXA) 1000 MG SR tablet TAKE 1  BY MOUTH TWICE DAILY 04/11/23   Billie Lade, MD  scopolamine (TRANSDERM-SCOP) 1 MG/3DAYS Place 1 patch (1.5 mg total) onto the skin every 3 (three) days. 02/18/23   Billie Lade, MD  senna-docusate (SENOKOT-S) 8.6-50 MG tablet Take 1-2 tablets by mouth daily as needed for mild constipation. 08/11/22   Gardenia Phlegm, MD  tiZANidine (ZANAFLEX) 4 MG tablet Take 1 tablet (4 mg total) by mouth at bedtime. 01/20/23   Billie Lade, MD  topiramate (TOPAMAX) 50  MG tablet Take 1 tablet (50 mg total) by mouth 2 (two) times daily. 03/16/23   Shon Hale, MD  warfarin (COUMADIN) 5 MG tablet TAKE ONE TABLET BY MOUTH DAILY EXCEPT 1/2 TABLET ON WEDNESDAYS AND SATURDAYS OR AS DIRECTED Patient taking differently: Take 2.5-5 mg by mouth daily. Take 5 mg daily and 2.5 mg on Wednesdays and Saturdays 02/14/23   Antoine Poche,  MD    Physical Exam: BP (!) 152/93   Pulse 97   Temp 98.8 F (37.1 C) (Axillary)   Resp 17   Ht 5\' 5"  (1.651 m)   Wt 131.5 kg   SpO2 94%   BMI 48.26 kg/m   General: 42 y.o. year-old female well developed well nourished in no acute distress.  Alert and oriented x3. HEENT: NCAT, EOMI Neck: Supple, trachea medial Cardiovascular: Regular rate and rhythm with no rubs or gallops.  No thyromegaly or JVD noted.  No lower extremity edema. 2/4 pulses in all 4 extremities. Respiratory: Clear to auscultation with no wheezes or rales. Good inspiratory effort. Abdomen: Soft, nontender nondistended with normal bowel sounds x4 quadrants. Muskuloskeletal: No cyanosis, clubbing or edema noted bilaterally Neuro: CN II-XII intact, strength 5/5 x 4, sensation, reflexes intact Skin: Abrasion to bilateral knees Psychiatry: Judgement and insight appear normal. Mood is appropriate for condition and setting          Labs on Admission:  Basic Metabolic Panel: Recent Labs  Lab 04/21/23 1646 04/21/23 1741 04/21/23 1912 04/21/23 2138 04/22/23 0107 04/22/23 0306  NA 125* 129* 127* 128* 130* 130*  K 5.6* 4.9 4.9 4.7 4.0 3.6  CL 92* 103 96* 97* 100 99  CO2 <7*  --  <7* 9* 15* 17*  GLUCOSE 888* >700* 758* 652* 524* 451*  BUN 39* 31* 40* 40* 40* 39*  CREATININE 4.60* 4.20* 4.67* 4.74* 4.44* 4.37*  CALCIUM 8.2*  --  7.7* 7.8* 7.9* 8.0*  MG  --   --   --   --   --  1.8  PHOS  --   --   --   --   --  4.4   Liver Function Tests: Recent Labs  Lab 04/21/23 1646  AST 83*  ALT 23  ALKPHOS 67  BILITOT 1.5*  PROT 6.1*  ALBUMIN 2.9*   No  results for input(s): "LIPASE", "AMYLASE" in the last 168 hours. No results for input(s): "AMMONIA" in the last 168 hours. CBC: Recent Labs  Lab 04/21/23 1640 04/21/23 1741 04/22/23 0306  WBC 14.3*  --  18.0*  HGB 11.5* 12.6 11.2*  HCT 38.3 37.0 34.7*  MCV 100.5*  --  92.8  PLT 351  --  315   Cardiac Enzymes: No results for input(s): "CKTOTAL", "CKMB", "CKMBINDEX", "TROPONINI" in the last 168 hours.  BNP (last 3 results) Recent Labs    07/27/22 0548 08/02/22 2023 11/07/22 1129  BNP 2,632.3* 869.3* 1,973.2*    ProBNP (last 3 results) No results for input(s): "PROBNP" in the last 8760 hours.  CBG: Recent Labs  Lab 04/22/23 0123 04/22/23 0215 04/22/23 0321 04/22/23 0354 04/22/23 0457  GLUCAP 463* 452* 422* 367* 330*    Radiological Exams on Admission: DG Chest Port 1 View Result Date: 04/21/2023 CLINICAL DATA:  Hyperglycemia EXAM: PORTABLE CHEST 1 VIEW COMPARISON:  Chest x-ray 10/11/2022 FINDINGS: Heart is mildly enlarged, unchanged. The lungs are clear. There is no pleural effusion or pneumothorax. No acute fractures are seen. IMPRESSION: No active disease. Electronically Signed   By: Darliss Cheney M.D.   On: 04/21/2023 21:18   CT Head Wo Contrast Result Date: 04/21/2023 CLINICAL DATA:  Headache, neuro deficit. EXAM: CT HEAD WITHOUT CONTRAST TECHNIQUE: Contiguous axial images were obtained from the base of the skull through the vertex without intravenous contrast. RADIATION DOSE REDUCTION: This exam was performed according to the departmental dose-optimization program which includes automated exposure control, adjustment of the mA and/or kV according  to patient size and/or use of iterative reconstruction technique. COMPARISON:  Head CT 03/13/2023 and MRI 03/05/2023 FINDINGS: Brain: There is no evidence of an acute infarct, intracranial hemorrhage, mass, midline shift, or extra-axial fluid collection. Chronic infarcts are again seen in the right cerebral hemisphere  primarily in the MCA distribution with associated ex vacuo dilatation of the right lateral ventricle and wallerian degeneration in the cerebral peduncle. A small chronic left occipital infarct is also again noted. Vascular: Calcified atherosclerosis at the skull base. No hyperdense vessel. Skull: No acute fracture or suspicious osseous lesion. Sinuses/Orbits: Visualized paranasal sinuses and mastoid air cells are clear. Unremarkable orbits. Other: None. IMPRESSION: 1. No evidence of acute intracranial abnormality. 2. 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 Electronically Signed   By: Sebastian Ache M.D.   On: 04/21/2023 19:19    EKG: I independently viewed the EKG done and my findings are as followed: Normal sinus rhythm at rate of 87 bpm with QTc of 535 ms  Assessment/Plan Present on Admission:  DKA (diabetic ketoacidosis) (HCC)  Acute kidney injury superimposed on CKD (HCC)  Hypotension  Essential hypertension  Acquired hypothyroidism  CAD (coronary artery disease)  Principal Problem:   DKA (diabetic ketoacidosis) (HCC) Active Problems:   Essential hypertension   Acquired hypothyroidism   CAD (coronary artery disease)   Acute kidney injury superimposed on CKD (HCC)   Hypotension   Seizure (HCC)   GERD (gastroesophageal reflux disease)   Acute metabolic encephalopathy   Lactic acidosis   Pseudohyponatremia   Hypoalbuminemia due to protein-calorie malnutrition (HCC)   Prolonged QT interval   Leukocytosis   Obesity, Class III, BMI 40-49.9 (morbid obesity) (HCC)   Diabetic ketoacidosis without coma High anion gap metabolic acidosis secondary to DKA Hyperglycemia secondary to poorly controlled type 2 diabetes mellitus VBG pH 7.08, pCO2 18, pO2 52, bicarb 5.2, O2 sat 80.2% Continue insulin drip, IV LR per DKA protocol IV bicarbonate will be given Transition IV LR to D5 LR when serum glucose reaches 250mg /dL Continue serial BMP and VBG Continue  to monitor for anion gap closure prior to transitioning patient to subcu insulin Continue NPO  Acute metabolic encephalopathy in the setting of above This may be due to patient's DKA with elevated lactic acidosis Continue management as described above Continue fall precaution, seizure precaution  Lactic acidosis Lactic acid 3.1 > 3.5 Continue IV hydration and continue to trend lactic acid  Acute kidney injury superimposed on CKD 5 Creatinine was noted to be 4.67 (baseline creatinine at 2.4-2.8) Continue gentle hydration Renally adjust medications, avoid nephrotoxic agents/dehydration/hypotension  Prolonged QT interval QTc 539 ms Renally adjust medications, avoid nephrotoxic agents/dehydration/hypotension  Hypotension-resolved IV hydration was provided with improved BP  Leukocytosis possibly reactive WBC at 14.3, no obvious infectious process at this time Blood culture pending  Macrocytic anemia MCV 100.5.  Vitamin B12 and folate levels will be checked  Pseudohyponatremia Na 125, corrected sodium level based on hyperglycemia is 138 Continue to monitor sodium levels  Hyperkalemia K+ 5.6, continue insulin drip per Endo tool Continue to monitor potassium level with serial BMPs  Seizures RN reported a momentary tonic-clonic seizures that responded to Ativan in the ED Reviewing patient's chart, she was noted to be on Klonopin and topiramate, these will be resumed Continue telemetry  Hypoalbuminemia possibly secondary to moderate protein calorie malnutrition Albumin 2.9, protein supplement will be provided  Morbid obesity (BMI 48.26) Patient will be counseled on diet and lifestyle modification when more alert and oriented  Essential hypertension BP meds will be held at this time due to hypotension  Acquired hypothyroidism Continue Synthroid  GERD Continue Protonix  CAD continue Consider resuming home meds when patient resumes oral intake  Peripheral Vascular  disease Consider resume home meds when patient resumes oral intake   DVT prophylaxis: warfarin   Code Status: DNR   Family Communication: None at bedside  Consults: None  Severity of Illness: The appropriate patient status for this patient is OBSERVATION. Observation status is judged to be reasonable and necessary in order to provide the required intensity of service to ensure the patient's safety. The patient's presenting symptoms, physical exam findings, and initial radiographic and laboratory data in the context of their medical condition is felt to place them at decreased risk for further clinical deterioration. Furthermore, it is anticipated that the patient will be medically stable for discharge from the hospital within 2 midnights of admission.   Author: Frankey Shown, DO 04/22/2023 5:18 AM  For on call review [email or web address].

## 2023-04-21 NOTE — ED Provider Notes (Signed)
[facility] AT [facility] Provider Note   CSN: [id number] Arrival date & time: 04/21/23  1614     History  Chief Complaint  Patient presents with   Hyperglycemia   Hypotension    Lauren [name] is a 42 y.o. female.  Patient brought in by EMS.  Patient with a complaint of headache and hypotension.  Patient said headache was 10 out of 10.  Patient also said that her insulin pump broke and she removed it.  Patient known to be diabetic and have ketoacidosis.  Patient was admitted November 10 through November 13 for acute kidney injury diabetic ketoacidosis hyperosmolar hyperglycemic state.  Patient without fever here.  Her visual blood pressures on arrival were 75 systolic.  Respiration was 23 heart rate was 87.  Oxygen sats on room air were 99%.  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  Has some residual left upper extremity weakness secondary to that.  Looks like patient supposed to be on Coumadin.  Will order INR.  The patient's blood sugar by EMS was just marked as high.  Here it was greater than 600 by her fingersticks.        Home Medications Prior to Admission medications   Medication Sig Start Date End Date Taking? Authorizing Provider  traZODone (DESYREL) 100 MG tablet Take 100 mg by mouth at bedtime. 04/05/23  Yes [provider]  acetaminophen (TYLENOL) 325 MG tablet Take 1-2 tablets (325-650 mg total) by mouth every 4 (four) hours as needed for mild pain. Patient taking differently: Take 650 mg by mouth as needed for mild pain (pain score 1-3). 02/18/22   Love, Evlyn Kanner, PA-C  atorvastatin (LIPITOR) 40 MG tablet TAKE 1 TABLET BY MOUTH ONCE DAILY. 01/25/23   Billie Lade, MD  clonazePAM (KLONOPIN) 1 MG disintegrating tablet Take 1 tablet (1 mg total) by mouth 2 (two) times daily as needed for seizure (For seizure lasting more than 2 minutes.). 03/16/23   Shon Hale, MD  clopidogrel (PLAVIX) 75 MG tablet Take 1 tablet (75 mg total) by  mouth daily. 04/29/22   Micki Riley, MD  Continuous Glucose Sensor (DEXCOM G6 SENSOR) MISC CHANGE SENSOR EVERY 10 DAYS. 03/14/23   Dani Gobble, NP  Continuous Glucose Transmitter (DEXCOM G6 TRANSMITTER) MISC Change transmitter every 90 days as directed. 01/20/23   Roma Kayser, MD  furosemide (LASIX) 40 MG tablet TAKE 1 TABLET BY MOUTH ONCE DAILY AS NEEDED FOR  SWELLING 03/22/23   Antoine Poche, MD  hydrALAZINE (APRESOLINE) 25 MG tablet Take 1 tablet (25 mg total) by mouth every 8 (eight) hours. Patient taking differently: Take 25 mg by mouth in the morning and at bedtime. 11/12/22   Noralee Stain, DO  insulin aspart (NOVOLOG) 100 UNIT/ML injection Use with Omnipod for daily dose around 100 units daily Patient taking differently: Inject 0-100 Units into the skin See admin instructions. Use with Omnipod 10/18/22   Dani Gobble, NP  Insulin Disposable Pump (OMNIPOD 5 [id number] PODS GEN 5) MISC Change pod every 48-72 hours. 11/11/22   Dani Gobble, NP  isosorbide mononitrate (IMDUR) 60 MG 24 hr tablet TAKE 2 TABLETS BY MOUTH ONCE DAILY. 02/21/23   Billie Lade, MD  levothyroxine (SYNTHROID) 125 MCG tablet Take 1 tablet (125 mcg total) by mouth daily at 6 (six) AM. 06/17/22 06/12/23  Dani Gobble, NP  metoCLOPramide (REGLAN) 10 MG tablet Take 10 mg by mouth 4 (four) times daily. 02/24/23   [provider]  metoprolol succinate (TOPROL-XL) 50 MG 24 hr tablet Take 1 tablet by mouth once daily 04/05/23   Antoine Poche, MD  Multiple Vitamin (MULTIVITAMIN WITH MINERALS) TABS tablet Take 1 tablet by mouth daily. 02/18/22   Love, Evlyn Kanner, PA-C  nitroGLYCERIN (NITROSTAT) 0.4 MG SL tablet Place 1 tablet (0.4 mg total) under the tongue every 5 (five) minutes x 3 doses as needed for chest pain. 02/18/22   Love, Evlyn Kanner, PA-C  oxyCODONE-acetaminophen (PERCOCET) 7.5-325 MG tablet Take 1 tablet by mouth daily as needed for severe pain. 02/08/23   Fanny Dance,  MD  pantoprazole (PROTONIX) 40 MG tablet Take 1 tablet (40 mg total) by mouth daily. Courtesy fill/ pt to get established with a primary MD for further refills Patient taking differently: Take 40 mg by mouth daily as needed (reflux). 06/01/22 03/14/23  Billie Lade, MD  ranolazine (RANEXA) 1000 MG SR tablet TAKE 1  BY MOUTH TWICE DAILY 04/11/23   Billie Lade, MD  scopolamine (TRANSDERM-SCOP) 1 MG/3DAYS Place 1 patch (1.5 mg total) onto the skin every 3 (three) days. 02/18/23   Billie Lade, MD  senna-docusate (SENOKOT-S) 8.6-50 MG tablet Take 1-2 tablets by mouth daily as needed for mild constipation. 08/11/22   Gardenia Phlegm, MD  tiZANidine (ZANAFLEX) 4 MG tablet Take 1 tablet (4 mg total) by mouth at bedtime. 01/20/23   Billie Lade, MD  topiramate (TOPAMAX) 50 MG tablet Take 1 tablet (50 mg total) by mouth 2 (two) times daily. 03/16/23   Shon Hale, MD  warfarin (COUMADIN) 5 MG tablet TAKE ONE TABLET BY MOUTH DAILY EXCEPT 1/2 TABLET ON WEDNESDAYS AND SATURDAYS OR AS DIRECTED Patient taking differently: Take 2.5-5 mg by mouth daily. Take 5 mg daily and 2.5 mg on Wednesdays and Saturdays 02/14/23   Antoine Poche, MD      Allergies    Visipaque [iodixanol], Wellbutrin [bupropion], Gabapentin, Cefepime, Ciprofloxacin hcl, and Tape    Review of Systems   Review of Systems  Constitutional:  Negative for chills and fever.  HENT:  Negative for ear pain and sore throat.   Eyes:  Negative for pain and visual disturbance.  Respiratory:  Negative for cough and shortness of breath.   Cardiovascular:  Negative for chest pain and palpitations.  Gastrointestinal:  Negative for abdominal pain and vomiting.  Genitourinary:  Negative for dysuria and hematuria.  Musculoskeletal:  Negative for arthralgias and back pain.  Skin:  Negative for color change and rash.  Neurological:  Positive for headaches. Negative for seizures and syncope.  All other systems reviewed and are  negative.   Physical Exam Updated Vital Signs BP (!) 70/37   Pulse 87   Temp (!) 97.5 F (36.4 C) (Oral)   Resp (!) 35   Ht 1.651 m (5\' 5" )   Wt 131.5 kg   SpO2 99%   BMI 48.26 kg/m  Physical Exam Vitals and nursing note reviewed.  Constitutional:      General: She is not in acute distress.    Appearance: Normal appearance. She is well-developed.  HENT:     Head: Normocephalic and atraumatic.     Mouth/Throat:     Mouth: Mucous membranes are dry.  Eyes:     Extraocular Movements: Extraocular movements intact.     Conjunctiva/sclera: Conjunctivae normal.     Pupils: Pupils are equal, round, and reactive to light.  Cardiovascular:     Rate and Rhythm: Normal rate and regular rhythm.  Heart sounds: No murmur heard. Pulmonary:     Effort: Pulmonary effort is normal. No respiratory distress.     Breath sounds: Normal breath sounds. No wheezing or rales.  Abdominal:     Palpations: Abdomen is soft.     Tenderness: There is no abdominal tenderness.  Musculoskeletal:        General: No swelling.     Cervical back: Neck supple. No rigidity.  Skin:    General: Skin is warm and dry.     Capillary Refill: Capillary refill takes less than 2 seconds.  Neurological:     Mental Status: She is alert and oriented to person, place, and time. Mental status is at baseline.  Psychiatric:        Mood and Affect: Mood normal.     ED Results / Procedures / Treatments   Labs (all labs ordered are listed, but only abnormal results are displayed) Labs Reviewed  CBC - Abnormal; Notable for the following components:      Result Value   WBC 14.3 (*)    RBC 3.81 (*)    Hemoglobin 11.5 (*)    MCV 100.5 (*)    All other components within normal limits  BLOOD GAS, VENOUS - Abnormal; Notable for the following components:   pH, Ven 7.08 (*)    pCO2, Ven 18 (*)    pO2, Ven 52 (*)    Bicarbonate 5.2 (*)    Acid-base deficit 23.1 (*)    All other components within normal limits   COMPREHENSIVE METABOLIC PANEL - Abnormal; Notable for the following components:   Sodium 125 (*)    Potassium 5.6 (*)    Chloride 92 (*)    CO2 <7 (*)    Glucose, Bld 888 (*)    BUN 39 (*)    Creatinine, Ser 4.60 (*)    Calcium 8.2 (*)    Total Protein 6.1 (*)    Albumin 2.9 (*)    AST 83 (*)    Total Bilirubin 1.5 (*)    GFR, Estimated 12 (*)    All other components within normal limits  CBG MONITORING, ED - Abnormal; Notable for the following components:   Glucose-Capillary >600 (*)    All other components within normal limits  I-STAT CHEM 8, ED - Abnormal; Notable for the following components:   Sodium 129 (*)    BUN 31 (*)    Creatinine, Ser 4.20 (*)    Glucose, Bld >700 (*)    Calcium, Ion 1.03 (*)    TCO2 7 (*)    All other components within normal limits  CULTURE, BLOOD (ROUTINE X 2)  CULTURE, BLOOD (ROUTINE X 2)  URINALYSIS, ROUTINE W REFLEX MICROSCOPIC  BETA-HYDROXYBUTYRIC ACID  LACTIC ACID, PLASMA  LACTIC ACID, PLASMA  PREGNANCY, URINE  TSH  BASIC METABOLIC PANEL  BASIC METABOLIC PANEL  BASIC METABOLIC PANEL  BASIC METABOLIC PANEL  CBG MONITORING, ED  POC URINE PREG, ED  CBG MONITORING, ED    EKG EKG Interpretation Date/Time:  Thursday April 21 2023 16:30:02 EST Ventricular Rate:  87 PR Interval:  163 QRS Duration:  119 QT Interval:  448 QTC Calculation: 539 R Axis:   92  Text Interpretation: Sinus rhythm Biatrial enlargement Nonspecific intraventricular conduction delay Inferior infarct, age indeterminate Probable anterior infarct, age indeterminate New since previous tracing Confirmed by Vanetta Mulders [phone]) on 04/21/2023 4:53:07 PM  Radiology No results found.  Procedures Procedures    Medications Ordered in ED Medications  lactated  ringers bolus 2,630 mL (has no administration in time range)  insulin regular, human (MYXREDLIN) 100 units/ 100 mL infusion (has no administration in time range)  lactated ringers infusion (has no  administration in time range)  dextrose 5 % in lactated ringers infusion (has no administration in time range)  dextrose 50 % solution 0-50 mL (has no administration in time range)  potassium chloride 10 mEq in 100 mL IVPB (has no administration in time range)  lactated ringers bolus 1,000 mL (1,000 mLs Intravenous New Bag/Given 04/21/23 1702)  insulin aspart (novoLOG) injection 10 Units (10 Units Intravenous Given 04/21/23 1710)  lactated ringers bolus 1,000 mL (1,000 mLs Intravenous New Bag/Given 04/21/23 1712)    ED Course/ Medical Decision Making/ A&P                                 Medical Decision Making Amount and/or Complexity of Data Reviewed Labs: ordered. Radiology: ordered.  Risk Prescription drug management. Decision regarding hospitalization.  CRITICAL CARE Performed by: Vanetta Mulders Total critical care time: 60 minutes Critical care time was exclusive of separately billable procedures and treating other patients. Critical care was necessary to treat or prevent imminent or life-threatening deterioration. Critical care was time spent personally by me on the following activities: development of treatment plan with patient and/or surrogate as well as nursing, discussions with consultants, evaluation of patient's response to treatment, examination of patient, obtaining history from patient or surrogate, ordering and performing treatments and interventions, ordering and review of laboratory studies, ordering and review of radiographic studies, pulse oximetry and re-evaluation of patient's condition.  pH venous blood gas was 7.08.  Consistent with the acidosis.  Lactic acid was 3.1 patient's beta-hydroxybutyrate acid was markedly elevated at greater than 8.  Patient's complete metabolic panel was significant for potassium of 5.6 glucose of 888 GFR was 12.  Sodium was 125 but that pseudohyponatremia.  TSH show patient known to have thyroid problems that was normal at 2.1.   Blood cultures are pending.  Patient received significant amount of fluids.  Blood pressure now is up in the low 100s.  Feel that this is mostly DKA.  With dehydration do not feel that this is sepsis but blood cultures are pending.  Discussed with hospitalist they will admit.  Patient started on the hyperglycemic protocol as well.  Final Clinical Impression(s) / ED Diagnoses Final diagnoses:  Diabetic ketoacidosis without coma associated with type 1 diabetes mellitus [facility])    Rx / DC Orders ED Discharge Orders     None         Vanetta Mulders, MD 04/21/23 2055

## 2023-04-21 NOTE — ED Notes (Signed)
VBG Ph 7.08 PCO2 18  Informed MD

## 2023-04-22 ENCOUNTER — Other Ambulatory Visit (HOSPITAL_COMMUNITY): Payer: Self-pay

## 2023-04-22 ENCOUNTER — Telehealth (HOSPITAL_COMMUNITY): Payer: Self-pay

## 2023-04-22 DIAGNOSIS — E871 Hypo-osmolality and hyponatremia: Secondary | ICD-10-CM | POA: Diagnosis present

## 2023-04-22 DIAGNOSIS — G9341 Metabolic encephalopathy: Secondary | ICD-10-CM | POA: Diagnosis present

## 2023-04-22 DIAGNOSIS — Z9641 Presence of insulin pump (external) (internal): Secondary | ICD-10-CM | POA: Diagnosis present

## 2023-04-22 DIAGNOSIS — N179 Acute kidney failure, unspecified: Secondary | ICD-10-CM | POA: Diagnosis present

## 2023-04-22 DIAGNOSIS — I5032 Chronic diastolic (congestive) heart failure: Secondary | ICD-10-CM | POA: Diagnosis present

## 2023-04-22 DIAGNOSIS — Z794 Long term (current) use of insulin: Secondary | ICD-10-CM | POA: Diagnosis not present

## 2023-04-22 DIAGNOSIS — E66813 Obesity, class 3: Secondary | ICD-10-CM | POA: Diagnosis present

## 2023-04-22 DIAGNOSIS — R569 Unspecified convulsions: Secondary | ICD-10-CM

## 2023-04-22 DIAGNOSIS — E1010 Type 1 diabetes mellitus with ketoacidosis without coma: Secondary | ICD-10-CM | POA: Diagnosis present

## 2023-04-22 DIAGNOSIS — E46 Unspecified protein-calorie malnutrition: Secondary | ICD-10-CM | POA: Diagnosis present

## 2023-04-22 DIAGNOSIS — D638 Anemia in other chronic diseases classified elsewhere: Secondary | ICD-10-CM | POA: Diagnosis present

## 2023-04-22 DIAGNOSIS — I132 Hypertensive heart and chronic kidney disease with heart failure and with stage 5 chronic kidney disease, or end stage renal disease: Secondary | ICD-10-CM | POA: Diagnosis present

## 2023-04-22 DIAGNOSIS — Z66 Do not resuscitate: Secondary | ICD-10-CM | POA: Diagnosis present

## 2023-04-22 DIAGNOSIS — I10 Essential (primary) hypertension: Secondary | ICD-10-CM

## 2023-04-22 DIAGNOSIS — E1022 Type 1 diabetes mellitus with diabetic chronic kidney disease: Secondary | ICD-10-CM | POA: Diagnosis present

## 2023-04-22 DIAGNOSIS — E039 Hypothyroidism, unspecified: Secondary | ICD-10-CM | POA: Diagnosis present

## 2023-04-22 DIAGNOSIS — K219 Gastro-esophageal reflux disease without esophagitis: Secondary | ICD-10-CM

## 2023-04-22 DIAGNOSIS — N390 Urinary tract infection, site not specified: Secondary | ICD-10-CM | POA: Diagnosis present

## 2023-04-22 DIAGNOSIS — Z6841 Body Mass Index (BMI) 40.0 and over, adult: Secondary | ICD-10-CM | POA: Diagnosis not present

## 2023-04-22 DIAGNOSIS — E1110 Type 2 diabetes mellitus with ketoacidosis without coma: Secondary | ICD-10-CM

## 2023-04-22 DIAGNOSIS — I2510 Atherosclerotic heart disease of native coronary artery without angina pectoris: Secondary | ICD-10-CM

## 2023-04-22 DIAGNOSIS — E875 Hyperkalemia: Secondary | ICD-10-CM | POA: Diagnosis present

## 2023-04-22 DIAGNOSIS — R7989 Other specified abnormal findings of blood chemistry: Secondary | ICD-10-CM

## 2023-04-22 DIAGNOSIS — E8809 Other disorders of plasma-protein metabolism, not elsewhere classified: Secondary | ICD-10-CM | POA: Diagnosis present

## 2023-04-22 DIAGNOSIS — E1051 Type 1 diabetes mellitus with diabetic peripheral angiopathy without gangrene: Secondary | ICD-10-CM | POA: Diagnosis present

## 2023-04-22 DIAGNOSIS — D509 Iron deficiency anemia, unspecified: Secondary | ICD-10-CM | POA: Diagnosis present

## 2023-04-22 DIAGNOSIS — N185 Chronic kidney disease, stage 5: Secondary | ICD-10-CM | POA: Diagnosis present

## 2023-04-22 DIAGNOSIS — N189 Chronic kidney disease, unspecified: Secondary | ICD-10-CM

## 2023-04-22 LAB — URINALYSIS, ROUTINE W REFLEX MICROSCOPIC
Bilirubin Urine: NEGATIVE
Glucose, UA: >=500 mg/dL — AB
Ketones, ur: 5 mg/dL — AB
Leukocytes,Ua: NEGATIVE
Nitrite: NEGATIVE
Protein, ur: 100 mg/dL — AB
Specific Gravity, Urine: 1.008 (ref 1.005–1.030)
pH: 5 (ref 5.0–8.0)

## 2023-04-22 LAB — CBC
HCT: 34.7 % — ABNORMAL LOW (ref 36.0–46.0)
Hemoglobin: 11.2 g/dL — ABNORMAL LOW (ref 12.0–15.0)
MCH: 29.9 pg (ref 26.0–34.0)
MCHC: 32.3 g/dL (ref 30.0–36.0)
MCV: 92.8 fL (ref 80.0–100.0)
Platelets: 315 10*3/uL (ref 150–400)
RBC: 3.74 MIL/uL — ABNORMAL LOW (ref 3.87–5.11)
RDW: 13.2 % (ref 11.5–15.5)
WBC: 18 10*3/uL — ABNORMAL HIGH (ref 4.0–10.5)
nRBC: 0 % (ref 0.0–0.2)

## 2023-04-22 LAB — GLUCOSE, CAPILLARY
Glucose-Capillary: 149 mg/dL — ABNORMAL HIGH (ref 70–99)
Glucose-Capillary: 125 mg/dL — ABNORMAL HIGH (ref 70–99)
Glucose-Capillary: 143 mg/dL — ABNORMAL HIGH (ref 70–99)
Glucose-Capillary: 168 mg/dL — ABNORMAL HIGH (ref 70–99)
Glucose-Capillary: 169 mg/dL — ABNORMAL HIGH (ref 70–99)
Glucose-Capillary: 208 mg/dL — ABNORMAL HIGH (ref 70–99)
Glucose-Capillary: 149 mg/dL — ABNORMAL HIGH (ref 70–99)
Glucose-Capillary: 143 mg/dL — ABNORMAL HIGH (ref 70–99)

## 2023-04-22 LAB — BASIC METABOLIC PANEL
Anion gap: 15 (ref 5–15)
Anion gap: 14 (ref 5–15)
Anion gap: 9 (ref 5–15)
BUN: 40 mg/dL — ABNORMAL HIGH (ref 6–20)
BUN: 39 mg/dL — ABNORMAL HIGH (ref 6–20)
BUN: 41 mg/dL — ABNORMAL HIGH (ref 6–20)
CO2: 15 mmol/L — ABNORMAL LOW (ref 22–32)
CO2: 17 mmol/L — ABNORMAL LOW (ref 22–32)
CO2: 15 mmol/L — ABNORMAL LOW (ref 22–32)
Calcium: 7.9 mg/dL — ABNORMAL LOW (ref 8.9–10.3)
Calcium: 8 mg/dL — ABNORMAL LOW (ref 8.9–10.3)
Calcium: 7.6 mg/dL — ABNORMAL LOW (ref 8.9–10.3)
Chloride: 100 mmol/L (ref 98–111)
Chloride: 99 mmol/L (ref 98–111)
Chloride: 107 mmol/L (ref 98–111)
Creatinine, Ser: 4.44 mg/dL — ABNORMAL HIGH (ref 0.44–1.00)
Creatinine, Ser: 4.37 mg/dL — ABNORMAL HIGH (ref 0.44–1.00)
Creatinine, Ser: 3.85 mg/dL — ABNORMAL HIGH (ref 0.44–1.00)
GFR, Estimated: 12 mL/min — ABNORMAL LOW (ref >60)
GFR, Estimated: 12 mL/min — ABNORMAL LOW (ref >60)
GFR, Estimated: 14 mL/min — ABNORMAL LOW (ref >60)
Glucose, Bld: 524 mg/dL (ref 70–99)
Glucose, Bld: 451 mg/dL — ABNORMAL HIGH (ref 70–99)
Glucose, Bld: 176 mg/dL — ABNORMAL HIGH (ref 70–99)
Potassium: 4 mmol/L (ref 3.5–5.1)
Potassium: 3.6 mmol/L (ref 3.5–5.1)
Potassium: 3.7 mmol/L (ref 3.5–5.1)
Sodium: 130 mmol/L — ABNORMAL LOW (ref 135–145)
Sodium: 130 mmol/L — ABNORMAL LOW (ref 135–145)
Sodium: 131 mmol/L — ABNORMAL LOW (ref 135–145)

## 2023-04-22 LAB — BLOOD GAS, VENOUS
Acid-base deficit: 11.5 mmol/L — ABNORMAL HIGH (ref 0.0–2.0)
Acid-base deficit: 4.7 mmol/L — ABNORMAL HIGH (ref 0.0–2.0)
Bicarbonate: 14.2 mmol/L — ABNORMAL LOW (ref 20.0–28.0)
Bicarbonate: 20.4 mmol/L (ref 20.0–28.0)
Drawn by: 1528
Drawn by: 44828
O2 Saturation: 84.2 %
O2 Saturation: 73.6 %
Patient temperature: 36.8
Patient temperature: 36.9
pCO2, Ven: 31 mm[Hg] — ABNORMAL LOW (ref 44–60)
pCO2, Ven: 37 mm[Hg] — ABNORMAL LOW (ref 44–60)
pH, Ven: 7.27 (ref 7.25–7.43)
pH, Ven: 7.35 (ref 7.25–7.43)
pO2, Ven: 50 mm[Hg] — ABNORMAL HIGH (ref 32–45)
pO2, Ven: 42 mm[Hg] (ref 32–45)

## 2023-04-22 LAB — MRSA NEXT GEN BY PCR, NASAL: MRSA by PCR Next Gen: NOT DETECTED

## 2023-04-22 LAB — FOLATE: Folate: 8.8 ng/mL (ref >5.9)

## 2023-04-22 LAB — MAGNESIUM: Magnesium: 1.8 mg/dL (ref 1.7–2.4)

## 2023-04-22 LAB — CBG MONITORING, ED
Glucose-Capillary: 157 mg/dL — ABNORMAL HIGH (ref 70–99)
Glucose-Capillary: 188 mg/dL — ABNORMAL HIGH (ref 70–99)
Glucose-Capillary: 238 mg/dL — ABNORMAL HIGH (ref 70–99)
Glucose-Capillary: 285 mg/dL — ABNORMAL HIGH (ref 70–99)
Glucose-Capillary: 330 mg/dL — ABNORMAL HIGH (ref 70–99)
Glucose-Capillary: 367 mg/dL — ABNORMAL HIGH (ref 70–99)
Glucose-Capillary: 422 mg/dL — ABNORMAL HIGH (ref 70–99)
Glucose-Capillary: 452 mg/dL — ABNORMAL HIGH (ref 70–99)
Glucose-Capillary: 463 mg/dL — ABNORMAL HIGH (ref 70–99)
Glucose-Capillary: 541 mg/dL (ref 70–99)

## 2023-04-22 LAB — VITAMIN B12: Vitamin B-12: 567 pg/mL (ref 180–914)

## 2023-04-22 LAB — PHOSPHORUS: Phosphorus: 4.4 mg/dL (ref 2.5–4.6)

## 2023-04-22 LAB — PROTIME-INR
INR: 3.8 — ABNORMAL HIGH (ref 0.8–1.2)
Prothrombin Time: 38 s — ABNORMAL HIGH (ref 11.4–15.2)

## 2023-04-22 LAB — BETA-HYDROXYBUTYRIC ACID
Beta-Hydroxybutyric Acid: 2.9 mmol/L — ABNORMAL HIGH (ref 0.05–0.27)
Beta-Hydroxybutyric Acid: 0.25 mmol/L (ref 0.05–0.27)

## 2023-04-22 LAB — LACTIC ACID, PLASMA: Lactic Acid, Venous: 2.6 mmol/L (ref 0.5–1.9)

## 2023-04-22 LAB — PREGNANCY, URINE: Preg Test, Ur: NEGATIVE

## 2023-04-22 MED ORDER — ATORVASTATIN CALCIUM 40 MG PO TABS
40.0000 mg | ORAL_TABLET | Freq: Every day | ORAL | Status: DC
  Administered 2023-04-22 – 2023-04-24 (×3): 40 mg via ORAL
  Filled 2023-04-22 (×3): qty 1

## 2023-04-22 MED ORDER — CLONAZEPAM 0.25 MG PO TBDP: 1.0000 mg | ORAL_TABLET | Freq: Two times a day (BID) | ORAL | Status: DC | PRN

## 2023-04-22 MED ORDER — INSULIN ASPART 100 UNIT/ML IJ SOLN: 0.0000 [IU] | Freq: Three times a day (TID) | INTRAMUSCULAR | Status: DC

## 2023-04-22 MED ORDER — INSULIN ASPART 100 UNIT/ML IJ SOLN
0.0000 [IU] | Freq: Three times a day (TID) | INTRAMUSCULAR | Status: DC
  Administered 2023-04-22: 1 [IU] via SUBCUTANEOUS
  Administered 2023-04-23 (×2): 2 [IU] via SUBCUTANEOUS
  Administered 2023-04-23 – 2023-04-24 (×2): 3 [IU] via SUBCUTANEOUS
  Administered 2023-04-24: 2 [IU] via SUBCUTANEOUS

## 2023-04-22 MED ORDER — INSULIN GLARGINE-YFGN 100 UNIT/ML ~~LOC~~ SOLN
10.0000 [IU] | Freq: Two times a day (BID) | SUBCUTANEOUS | Status: DC
  Administered 2023-04-22 – 2023-04-24 (×5): 10 [IU] via SUBCUTANEOUS
  Filled 2023-04-22 (×7): qty 0.1

## 2023-04-22 MED ORDER — PANTOPRAZOLE SODIUM 40 MG PO TBEC
40.0000 mg | DELAYED_RELEASE_TABLET | Freq: Every day | ORAL | Status: DC
  Administered 2023-04-22 – 2023-04-24 (×3): 40 mg via ORAL
  Filled 2023-04-22 (×3): qty 1

## 2023-04-22 MED ORDER — LACTATED RINGERS IV SOLN: INTRAVENOUS | Status: AC

## 2023-04-22 MED ORDER — RANOLAZINE ER 500 MG PO TB12
500.0000 mg | ORAL_TABLET | Freq: Two times a day (BID) | ORAL | Status: DC
  Administered 2023-04-22 – 2023-04-24 (×5): 500 mg via ORAL
  Filled 2023-04-22 (×5): qty 1

## 2023-04-22 MED ORDER — LORAZEPAM 2 MG/ML IJ SOLN
INTRAMUSCULAR | Status: AC
  Administered 2023-04-22: 1 mg
  Filled 2023-04-22: qty 1

## 2023-04-22 MED ORDER — CLOPIDOGREL BISULFATE 75 MG PO TABS
75.0000 mg | ORAL_TABLET | Freq: Every day | ORAL | Status: DC
  Administered 2023-04-22 – 2023-04-24 (×3): 75 mg via ORAL
  Filled 2023-04-22 (×3): qty 1

## 2023-04-22 MED ORDER — TRAZODONE HCL 50 MG PO TABS
100.0000 mg | ORAL_TABLET | Freq: Every day | ORAL | Status: DC
Start: 2023-04-22 — End: 2023-04-24
  Administered 2023-04-22 – 2023-04-23 (×2): 100 mg via ORAL
  Filled 2023-04-22 (×2): qty 2

## 2023-04-22 MED ORDER — HYDRALAZINE HCL 25 MG PO TABS
25.0000 mg | ORAL_TABLET | Freq: Two times a day (BID) | ORAL | Status: DC
  Administered 2023-04-22 – 2023-04-24 (×5): 25 mg via ORAL
  Filled 2023-04-22 (×5): qty 1

## 2023-04-22 MED ORDER — ACETAMINOPHEN 325 MG PO TABS
650.0000 mg | ORAL_TABLET | Freq: Four times a day (QID) | ORAL | Status: DC | PRN
  Administered 2023-04-22 – 2023-04-24 (×2): 650 mg via ORAL
  Filled 2023-04-22 (×2): qty 2

## 2023-04-22 MED ORDER — INSULIN ASPART 100 UNIT/ML IJ SOLN
0.0000 [IU] | Freq: Every day | INTRAMUSCULAR | Status: DC
  Administered 2023-04-23: 2 [IU] via SUBCUTANEOUS

## 2023-04-22 MED ORDER — CHLORHEXIDINE GLUCONATE CLOTH 2 % EX PADS
6.0000 | MEDICATED_PAD | Freq: Every day | CUTANEOUS | Status: DC
  Administered 2023-04-22 – 2023-04-24 (×2): 6 via TOPICAL

## 2023-04-22 MED ORDER — WARFARIN - PHARMACIST DOSING INPATIENT: Freq: Every day | Status: DC

## 2023-04-22 MED ORDER — METOPROLOL SUCCINATE ER 50 MG PO TB24
50.0000 mg | ORAL_TABLET | Freq: Every day | ORAL | Status: DC
  Administered 2023-04-22 – 2023-04-24 (×3): 50 mg via ORAL
  Filled 2023-04-22 (×3): qty 1

## 2023-04-22 MED ORDER — ISOSORBIDE MONONITRATE ER 60 MG PO TB24
120.0000 mg | ORAL_TABLET | Freq: Every day | ORAL | Status: DC
  Administered 2023-04-22 – 2023-04-24 (×3): 120 mg via ORAL
  Filled 2023-04-22 (×3): qty 2

## 2023-04-22 MED ORDER — TOPIRAMATE 25 MG PO TABS
50.0000 mg | ORAL_TABLET | Freq: Two times a day (BID) | ORAL | Status: DC
  Administered 2023-04-22 – 2023-04-24 (×5): 50 mg via ORAL
  Filled 2023-04-22 (×5): qty 2

## 2023-04-22 MED ORDER — INSULIN ASPART 100 UNIT/ML IJ SOLN
0.0000 [IU] | Freq: Three times a day (TID) | INTRAMUSCULAR | Status: DC
  Administered 2023-04-22: 2 [IU] via SUBCUTANEOUS

## 2023-04-22 MED ORDER — SODIUM BICARBONATE 8.4 % IV SOLN
INTRAVENOUS | Status: AC
  Filled 2023-04-22: qty 150

## 2023-04-22 NOTE — ED Notes (Signed)
Dr. Thomes Dinning notified of patients seizure activity for 30 seconds. Seizing has stopped. 1 mg of Ativan was given. All vital signs are stable.

## 2023-04-22 NOTE — Progress Notes (Signed)
PHARMACY - ANTICOAGULATION CONSULT NOTE  Pharmacy Consult for warfarin  Indication:  History of stroke  Allergies  Allergen Reactions   Visipaque [Iodixanol] Nausea And Vomiting    Patient had vagal response during procedure became hypertensive, and bradycardic. CO2 used during procedure prior to contrast being used, this may be cause as well. Recommended premedicating prior to contrast being used in the future.    Wellbutrin [Bupropion] Hives   Gabapentin     dizzy   Cefepime Rash    Tolerates penicilllin   Ciprofloxacin Hcl Hives and Rash    Hives/rash at injection site; 01/15/22 tolerated IV cipro   Tape Rash    Patient Measurements: Height: 5\' 5"  (165.1 cm) Weight: 131.5 kg (290 lb) IBW/kg (Calculated) : 57   Vital Signs: Temp: 97.7 F (36.5 C) (12/20 0602) Temp Source: Axillary (12/20 0602) BP: 140/96 (12/20 0600) Pulse Rate: 97 (12/20 0615)  Labs: Recent Labs     0000 04/19/23 0847 04/21/23 1640 04/21/23 1646 04/21/23 1741 04/21/23 1912 04/21/23 2138 04/22/23 0107 04/22/23 0306  HGB   < >  --  11.5*  --  12.6  --   --   --  11.2*  HCT  --   --  38.3  --  37.0  --   --   --  34.7*  PLT  --   --  351  --   --   --   --   --  315  LABPROT  --   --   --   --   --   --  32.4*  --  38.0*  INR  --  1.4*  --   --   --   --  3.1*  --  3.8*  CREATININE  --   --   --    < > 4.20*   < > 4.74* 4.44* 4.37*   < > = values in this interval not displayed.    Estimated Creatinine Clearance: 23.4 mL/min (A) (by C-G formula based on SCr of 4.37 mg/dL (H)).   Medical History: Past Medical History:  Diagnosis Date   Anemia    CAD (coronary artery disease)    a. s/p cath in 03/2014 showing 30% mid-LAD, moderate to severe disease along small D1, patent LCx, moderate to severe distal OM2 stenosis and moderate diffuse diease along RCA not amenable to PCI   CHF (congestive heart failure) (HCC)    a. EF 55-60% in 12/2019 b. EF at 35-40% by echo in 05/2020   CKD (chronic  kidney disease) stage 4, GFR 15-29 ml/min (HCC)    Diabetes mellitus without complication (HCC)    Myocardial infarction (HCC)    Neuropathy    PVD (peripheral vascular disease) (HCC)    Stroke (HCC) 01/2022   L hand weakness    Assessment: 42 year old female with history stroke on chronic coumadin. INR was 3.1 on admit and has now risen to 3.8. Will hold warfarin tonight.   Last clinic visit on 12/17 showed low INR of 1.4, it appears she ran out of warfarin tablets. Dosing from that visit was warfarin 5mg  of warfarin daily except 2.5mg  on TuTrSa.    Goal of Therapy:  INR 2-3 Monitor platelets by anticoagulation protocol: Yes   Plan:  Hold warfarin tonight Daily INR   Sheppard Coil PharmD., BCPS Clinical Pharmacist 04/22/2023 6:36 AM

## 2023-04-22 NOTE — ED Notes (Signed)
Staff witnessed pt having full body seizure, lasting approximately 30 seconds. Dr. Pilar Plate notified

## 2023-04-22 NOTE — Inpatient Diabetes Management (Addendum)
Inpatient Diabetes Program Recommendations  AACE/ADA: New Consensus Statement on Inpatient Glycemic Control   Target Ranges:  Prepandial:   less than 140 mg/dL      Peak postprandial:   less than 180 mg/dL (1-2 hours)      Critically ill patients:  140 - 180 mg/dL    Latest Reference Range & Units 03/15/23 09:04 03/15/23 10:14 03/15/23 11:14 03/15/23 11:53 03/15/23 16:58 03/15/23 20:57  Glucose-Capillary 70 - 99 mg/dL 366 (H) 440 (H) 347 (H) 164 (H) 178 (H) 256 (H)   Review of Glycemic Control  Diabetes history: DM15 (dx at 42 years old; makes NO insulin - requires basal, correction, and carb coverage insulin) Outpatient Diabetes medications: OmniPod insulin pump with Novolog; Dexcom G6 CGM Current orders for Inpatient glycemic control: IV insulin  Inpatient Diabetes Program Recommendations:   Insulin: IV insulin should be continued until acidosis has completely resolved. Once acidosis has resolved and provider is ready to transition from IV to SQ insulin, please consider ordering Semglee 25 units Q24H, CBGs Q4H, Novolog 0-15 units Q4H, and Novolog 5 units TID with meals for meal coverage if patient eats at least 50% of meals.  Outpatient DM: At discharge, please provide Rx for Lantus Solostar pens [phone]) and insulin pen needles [phone]) so patient has basal insulin on hand to use in event of insulin pump issues.  NOTE: Diabetes coordinator working remotely.  Patient admitted with DKA, acute metabolic encephalopathy, lactic acidosis, and AKI. Initial lab glucose 888 mg/dl and patient was started on IV insulin for DKA; currently still ordered IV insulin. Per ED triage note on 12/19, "insulin pump ran out last night".  Patient uses an OmniPod insulin pump which is a disposable pod that holds insulin and only last for 3 days. Per chart, patient sees Percell Boston, NP for DM management and was last seen on 10/18/22. Not able to see all insulin pump settings in office note from 10/18/22. Tried to call  patient's cell phone number but call forwarded to voice mail. Communicated with Toniann Fail, RN and she took the patient phone in ED room. Called ED and talked with patient over the phone. Patient alert, oriented, and answering questions appropriately but delayed response when answering questions. Patient confirms that she is using OmniPod insulin pump with Novolog and Dexcom G6 sensor. Patient reports that her Dexcom CGM sensor was removed at the hospital. Patient reports that she normally uses automode on insulin pump and reports that glucose is usually 100-200's mg/dl and she rarely has any hypoglycemia.  She states her insulin pump expired during the night on 04/20/23. She reports she still has OmniPod insulin pump pods, Novolog, and Dexcom G6 sensors at home. Patient states she does not have any long acting insulin at home to use for backup when she has an issue with her insulin pump. Patient reports that she does not have any appointments scheduled with Endocrinologist. Asked that she call Endocrinologist and get appointment for follow up since not since since 10/18/22.  Informed patient that IV insulin should be continued until acidosis has cleared and then SQ insulin regimen would be ordered. Informed patient that it would be requested that she be prescribed basal insulin at discharge to have as a backup when she has issues with her insulin pump. Patient verbalized understanding and has no questions at this time. Sent chat message to outpatient [facility] pharmacy to see which basal insulin is covered and copay.  Thanks, Orlando Penner, RN, MSN, CDCES Diabetes Coordinator Inpatient Diabetes Program  [phone] (Team Pager from 8am to 5pm)

## 2023-04-22 NOTE — Plan of Care (Signed)

## 2023-04-22 NOTE — TOC Initial Note (Signed)
Transition of Care [facility]) - Initial/Assessment Note    Patient Details  Name: Lauren [name] MRN: [id number] Date of Birth: 10-09-1980  Transition of Care [facility]) CM/SW Contact:    Leitha Bleak, RN Phone Number: 04/22/2023, 3:07 PM  Clinical Narrative: Patient admitted with DKA, recently admitted and assessed. Plans to return home at discharge. Lives with her husband and son. She requires assist with ADLs from husband and sister. Pt primarily uses wheelchair. Her family provides transportation as needed.          Expected Discharge Plan: Home/Self Care Barriers to Discharge: Continued Medical Work up   Patient Goals and CMS Choice Patient states their goals for this hospitalization and ongoing recovery are:: retunr home with family CMS Medicare.gov Compare Post Acute Care list provided to:: Patient Choice offered to / list presented to : Patient      Expected Discharge Plan and Services      Living arrangements for the past 2 months: Apartment           Prior Living Arrangements/Services Living arrangements for the past 2 months: Apartment Lives with:: Spouse, Minor Children             Current home services: DME    Activities of Daily Living   ADL Screening (condition at time of admission) Independently performs ADLs?: Yes (appropriate for developmental age) Is the patient deaf or have difficulty hearing?: No Does the patient have difficulty seeing, even when wearing glasses/contacts?: No Does the patient have difficulty concentrating, remembering, or making decisions?: No  Permission Sought/Granted       Emotional Assessment     Affect (typically observed): Accepting Orientation: : Oriented to Self, Oriented to Place, Oriented to  Time, Oriented to Situation Alcohol / Substance Use: Not Applicable Psych Involvement: No (comment)  Admission diagnosis:  DKA (diabetic ketoacidosis) (HCC) [E11.10] Diabetic ketoacidosis without coma associated with type 1  diabetes mellitus (HCC) [E10.10] Patient Active Problem List   Diagnosis Date Noted   DKA (diabetic ketoacidosis) (HCC) 04/21/2023   GERD (gastroesophageal reflux disease) 04/21/2023   Acute metabolic encephalopathy 04/21/2023   Lactic acidosis 04/21/2023   Pseudohyponatremia 04/21/2023   Hypoalbuminemia due to protein-calorie malnutrition (HCC) 04/21/2023   Prolonged QT interval 04/21/2023   Leukocytosis 04/21/2023   Obesity, Class III, BMI 40-49.9 (morbid obesity) (HCC) 04/21/2023   Abnormal urine odor 03/27/2023   Hyperglycemia 03/14/2023   Hyperosmolar hyperglycemic state (HHS) (HCC) 03/14/2023   Seizure (HCC) 03/13/2023   Behavior concern in adult 03/06/2023   Episodes of formed visual hallucinations 03/06/2023   Unstable angina (HCC) 11/08/2022   Chest pain due to CAD (HCC) 11/07/2022   Seizure-like activity (HCC) 10/28/2022   Acute cystitis without hematuria 10/28/2022   Dizziness 10/11/2022   Right-sided chest wall pain 10/11/2022   Injury of left thumb 10/11/2022   CKD (chronic kidney disease) 09/29/2022   History of blood clots 09/29/2022   Productive cough 09/20/2022   Abnormal TSH 09/13/2022   Lung nodule seen on imaging study 08/11/2022   Elevated troponin level not due myocardial infarction -> level still declining from previous infarction 08/03/2022   Diarrhea 08/03/2022   Chronic nausea 08/02/2022   Osteomyelitis (HCC) 07/20/2022   Diabetic foot infection (HCC) 07/19/2022   PVD (peripheral vascular disease) (HCC) 07/19/2022   DNR (do not resuscitate) 07/19/2022   Critical limb ischemia of left lower extremity (HCC) 06/01/2022   Open abdominal wall wound, initial encounter 04/28/2022   Insomnia 04/07/2022   Encounter for therapeutic drug monitoring 04/07/2022  Ulcer of left foot due to type 2 diabetes mellitus (HCC)    Acute on chronic combined systolic and diastolic CHF (congestive heart failure) (HCC)    Hypotension    Ischemic cardiomyopathy    Acute  right MCA stroke (HCC) 01/28/2022   Elevated troponin    Aspiration pneumonia (HCC) 01/18/2022   Acute respiratory failure with hypoxia (HCC)    HFrEF (heart failure with reduced ejection fraction) /chronic systolic dysfunction CHF with EF of 30 to 35%    Right carotid artery occlusion 01/08/2022   Internal carotid artery occlusion, 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   Menorrhagia with irregular cycle 12/15/2021   Pregnancy examination or test, negative result 12/15/2021   Encounter for IUD insertion 12/15/2021   Critical ischemia of upper extremity (HCC) 11/18/2021   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) 11/18/2021   Cellulitis of left foot 09/02/2021   Acute osteomyelitis of left foot (HCC) 09/02/2021   Foot ulcer (HCC) 08/27/2021   Sepsis (HCC) 08/27/2021   Hypokalemia 08/27/2021   Cellulitis 06/18/2021   Chronic combined systolic and diastolic heart failure (HCC) 06/18/2021   History of CVA (cerebrovascular accident) 06/18/2021   Encounter for cervical Pap smear with pelvic exam 07/23/2020   Menorrhagia with regular cycle 07/23/2020   Iron deficiency anemia due to chronic blood loss 07/23/2020   Menstrual cramps 07/23/2020   Class 2 obesity 06/18/2020   CAD (coronary artery disease) 06/18/2020   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) 06/18/2020   Angina at rest [facility]) 06/18/2020   Mixed hyperlipidemia    Class 2 obesity due to excess calories without serious comorbidity with body mass index (BMI) of 39.0 to 39.9 in adult    Acute kidney injury superimposed on CKD (HCC) 01/01/2020   Acute ischemic stroke (HCC) 01/01/2020   Tobacco abuse 01/01/2020   Cerebrovascular accident (CVA) (HCC) 12/31/2019   Left sided numbness 12/31/2019   Vitamin D deficiency 07/24/2015   Essential hypertension 03/17/2015   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 [place] [address] Phone: [phone] Fax: [phone]     Social Drivers of Health (SDOH) Social History: SDOH Screenings   Food Insecurity: Patient Unable To Answer (04/21/2023)  Housing: Patient Unable To Answer (04/21/2023)  Transportation Needs: Patient Unable To Answer (04/21/2023)  Utilities: Patient Unable To Answer (04/21/2023)  Alcohol Screen: Low Risk  (11/09/2022)  Depression (PHQ2-9): Low Risk  (02/18/2023)  Recent Concern: Depression (PHQ2-9) - Medium Risk (02/04/2023)  Financial Resource Strain: Low Risk  (11/09/2022)  Physical Activity: Inactive (03/03/2022)  Social Connections: Socially Integrated (03/03/2022)  Stress: No Stress Concern Present (03/03/2022)  Recent Concern: Stress - Stress Concern Present (12/22/2021)  Tobacco Use: Medium Risk (04/21/2023)   SDOH Interventions:     Readmission Risk Interventions    04/22/2023    3:05 PM 03/16/2023   11:32 AM 08/05/2022   11:08 AM  Readmission Risk Prevention Plan  Transportation Screening Complete Complete Complete  Medication Review Oceanographer) Complete Complete Complete  PCP or Specialist appointment within 3-5 days of discharge Not Complete Not Complete Complete  HRI or Home Care Consult Complete Complete   SW Recovery Care/Counseling Consult Complete  Complete   Palliative Care Screening Not Applicable Not Applicable Not Applicable  Skilled Nursing Facility Not Applicable Not Applicable Not Applicable

## 2023-04-22 NOTE — Progress Notes (Signed)
Progress Note   Patient: Lauren [name] ZOX:[id number] DOB: 07-May-1980 DOA: 04/21/2023     0 DOS: the patient was seen and examined on 04/22/2023   Brief hospital admission course: As per H&P written by Dr. Thomes Dinning on 04/21/2023 Rhaya Buening is a 42 y.o. female with medical history significant of CAD, HFpEF, T2DM, CKD stage 4, history of stroke and history of seizure who presents to the emergency department from home via EMS due to headache.  Patient was unable to provide history possibly due to altered mental status, history was obtained from ED physician and medical record.  Per report, EMS was activated due to patient's headache, on arrival of EMS team, patient was noted to have hypotension with a BP of 83/46, blood glucose level showed "HI".  It was reported that patient's insulin pump broke last night.  She was taken to the ED for further evaluation and management.   ED Course:  In the emergency department, respiratory rate was 35, though this eventually improved to 19.  Temperature 0.5 F, BP 70/37, other vital signs were within normal range.  Workup in the ED showed leukocytosis and microcytic anemia, BMP showed hyponatremia with sodium of 125, potassium 5.6, chloride 92, bicarb less than 7, blood glucose 888, BUN 9, creatinine 4.60, calcium 8.2, albumin 2.9, AST 83, ALT 23, ALP 67, total bilirubin 1.5, EGFR 12, TSH 2.105.  Blood culture pending. CT of head without contrast showed no evidence of acute intracranial abnormality 10 units of IV NovoLog was initially given, patient was then started on insulin drip per Endo tool. Hospitalist was asked to admit patient for further evaluation and management.  Assessment and Plan: 1-type II DKA without coma -Will continue treatment with insulin drip and adequate fluid resuscitation -Transition to sliding scale insulin and long-acting when appropriate -Closely monitor CBGs and electrolytes to further adjust management as needed  2-acute  metabolic encephalopathy -Appears to be in the setting of DKA -There was concern for seizure activity while in the ED -Continue seizure precautions -Resume home Topamax and as needed Klonopin  3-pseudohyponatremia -Resolving with management of hyperglycemia -Continue to follow electrolytes.  4-lactic acidosis at time of admission trending down appropriately -No signs of infection appreciated -Continue to maintain adequate hydration.  5-acute kidney injury superimposed on chronic kidney disease stage V -Continue minimizing nephrotoxic agents, maintain adequate hydration, avoid the use of contrast and avoid hypotension -Follow renal function trend.  6-macrocytic anemia -MCV at 100.5 -Follow results of B12 and folate -Will replete as needed.  7-anemia of chronic disease -Continue to follow hemoglobin trend -No overt bleeding appreciated  8-hypertension -Continue slowly resuming home antihypertensive agent -Follow vital sign -Maintain adequate hydration.  9-obesity (class II) -Body mass index is 35.11 kg/m.  -Low-calorie diet and portion control discussed with patient.  10-GERD -Continue PPI.  11-coronary to disease/peripheral vascular disease -Slowly resuming home medication -No complaining of chest pain -Continue treatment with Plavix and statin.   Subjective:  No fever, following commands appropriately and denying chest pain.  Patient reports having some headache.  No nausea or vomiting.  Physical Exam: Vitals:   04/22/23 1016 04/22/23 1017 04/22/23 1018 04/22/23 1100  BP:   (!) 156/71 (!) 123/57  Pulse:  97 96 96  Resp: 13 14 20 20   Temp:   98 F (36.7 C)   TempSrc:   Oral   SpO2: 97% 99% 99% 99%  Weight:   95.7 kg   Height:   5\' 5"  (1.651 m)    General  exam: In no acute distress; expressing headache.  No nausea, no vomiting. Respiratory system: Clear to auscultation. Respiratory effort normal.  Good saturation on room air. Cardiovascular system:RRR. No  rubs or gallops. Gastrointestinal system: Abdomen is nondistended, soft and nontender. No organomegaly or masses felt. Normal bowel sounds heard. Central nervous system: No focal neurological deficits. Extremities: No cyanosis, clubbing or edema. Skin: No petechiae. Psychiatry: Flat affect appreciated on exam.  Data Reviewed: Beta hydroxybutyric acid 2.90 Basic metabolic panel: Sodium 131, potassium 3.7, chloride 107, bicarb 15, glucose 176, BUN 41, creatinine 3.85 and GFR 14  Family Communication: No family at bedside.  Disposition: Status is: Inpatient Remains inpatient appropriate because: Continue treatment for acute kidney injury on chronic kidney disease and DKA process.   Planned Discharge Destination: Home  Time spent: 50 minutes  Author: Vassie Loll, MD 04/22/2023 11:52 AM  For on call review [email or web address].

## 2023-04-22 NOTE — Telephone Encounter (Signed)
Pharmacy Patient Advocate Encounter  Insurance verification completed.    The patient is insured through [employer] and [employer]/ [employer]. Patient has Medicare and is not eligible for a copay card, but may be able to apply for patient assistance, if available.    Ran test claim for Lantus and the current 30 day co-pay is $0.00.   This test claim was processed through [facility]- copay amounts may vary at other pharmacies due to pharmacy/plan contracts, or as the patient moves through the different stages of their insurance plan.

## 2023-04-23 DIAGNOSIS — I10 Essential (primary) hypertension: Secondary | ICD-10-CM | POA: Diagnosis not present

## 2023-04-23 DIAGNOSIS — E1110 Type 2 diabetes mellitus with ketoacidosis without coma: Secondary | ICD-10-CM | POA: Diagnosis not present

## 2023-04-23 DIAGNOSIS — K219 Gastro-esophageal reflux disease without esophagitis: Secondary | ICD-10-CM | POA: Diagnosis not present

## 2023-04-23 DIAGNOSIS — G9341 Metabolic encephalopathy: Secondary | ICD-10-CM | POA: Diagnosis not present

## 2023-04-23 LAB — CBC
HCT: 33.1 % — ABNORMAL LOW (ref 36.0–46.0)
Hemoglobin: 10.9 g/dL — ABNORMAL LOW (ref 12.0–15.0)
MCH: 29.7 pg (ref 26.0–34.0)
MCHC: 32.9 g/dL (ref 30.0–36.0)
MCV: 90.2 fL (ref 80.0–100.0)
Platelets: 304 10*3/uL (ref 150–400)
RBC: 3.67 MIL/uL — ABNORMAL LOW (ref 3.87–5.11)
RDW: 13.4 % (ref 11.5–15.5)
WBC: 16.2 10*3/uL — ABNORMAL HIGH (ref 4.0–10.5)
nRBC: 0 % (ref 0.0–0.2)

## 2023-04-23 LAB — BASIC METABOLIC PANEL WITH GFR
Anion gap: 12 (ref 5–15)
BUN: 38 mg/dL — ABNORMAL HIGH (ref 6–20)
CO2: 19 mmol/L — ABNORMAL LOW (ref 22–32)
Calcium: 7.5 mg/dL — ABNORMAL LOW (ref 8.9–10.3)
Chloride: 101 mmol/L (ref 98–111)
Creatinine, Ser: 3.7 mg/dL — ABNORMAL HIGH (ref 0.44–1.00)
GFR, Estimated: 15 mL/min — ABNORMAL LOW
Glucose, Bld: 193 mg/dL — ABNORMAL HIGH (ref 70–99)
Potassium: 4.1 mmol/L (ref 3.5–5.1)
Sodium: 132 mmol/L — ABNORMAL LOW (ref 135–145)

## 2023-04-23 LAB — HEMOGLOBIN A1C
Hgb A1c MFr Bld: 8.7 % — ABNORMAL HIGH (ref 4.8–5.6)
Mean Plasma Glucose: 203 mg/dL

## 2023-04-23 LAB — GLUCOSE, CAPILLARY
Glucose-Capillary: 175 mg/dL — ABNORMAL HIGH (ref 70–99)
Glucose-Capillary: 182 mg/dL — ABNORMAL HIGH (ref 70–99)
Glucose-Capillary: 240 mg/dL — ABNORMAL HIGH (ref 70–99)
Glucose-Capillary: 243 mg/dL — ABNORMAL HIGH (ref 70–99)

## 2023-04-23 LAB — PROTIME-INR
INR: 3.1 — ABNORMAL HIGH (ref 0.8–1.2)
Prothrombin Time: 31.9 s — ABNORMAL HIGH (ref 11.4–15.2)

## 2023-04-23 MED ORDER — CEFTRIAXONE SODIUM 2 G IJ SOLR
2.0000 g | INTRAMUSCULAR | Status: DC
  Administered 2023-04-23 – 2023-04-24 (×2): 2 g via INTRAVENOUS
  Filled 2023-04-23 (×2): qty 20

## 2023-04-23 MED ORDER — WARFARIN SODIUM 2.5 MG PO TABS
2.5000 mg | ORAL_TABLET | Freq: Once | ORAL | Status: AC
  Administered 2023-04-23: 2.5 mg via ORAL
  Filled 2023-04-23: qty 1

## 2023-04-23 NOTE — Plan of Care (Signed)

## 2023-04-23 NOTE — Progress Notes (Signed)
Progress Note   Patient: Lauren [name] WUX:[id number] DOB: 1980-05-21 DOA: 04/21/2023     1 DOS: the patient was seen and examined on 04/23/2023   Brief hospital admission course: As per H&P written by Dr. Thomes Dinning on 04/21/2023 Lauren [name] is a 42 y.o. female with medical history significant of CAD, HFpEF, T2DM, CKD stage 4, history of stroke and history of seizure who presents to the emergency department from home via EMS due to headache.  Patient was unable to provide history possibly due to altered mental status, history was obtained from ED physician and medical record.  Per report, EMS was activated due to patient's headache, on arrival of EMS team, patient was noted to have hypotension with a BP of 83/46, blood glucose level showed "HI".  It was reported that patient's insulin pump broke last night.  She was taken to the ED for further evaluation and management.   ED Course:  In the emergency department, respiratory rate was 35, though this eventually improved to 19.  Temperature 0.5 F, BP 70/37, other vital signs were within normal range.  Workup in the ED showed leukocytosis and microcytic anemia, BMP showed hyponatremia with sodium of 125, potassium 5.6, chloride 92, bicarb less than 7, blood glucose 888, BUN 9, creatinine 4.60, calcium 8.2, albumin 2.9, AST 83, ALT 23, ALP 67, total bilirubin 1.5, EGFR 12, TSH 2.105.  Blood culture pending. CT of head without contrast showed no evidence of acute intracranial abnormality 10 units of IV NovoLog was initially given, patient was then started on insulin drip per Endo tool. Hospitalist was asked to admit patient for further evaluation and management.  Assessment and Plan: 1-type II DKA without coma -DKA has resolved; diet advance and sliding scale insulin/long-acting Semglee initiated -Follow CBG fluctuation and adjust hypoglycemic regimen as needed -Continue modified carbohydrate diet.-  2-acute metabolic encephalopathy -Appears to  be in the setting of DKA -There was concern for seizure activity while in the ED -Continue seizure precautions -Resume home Topamax and as needed Klonopin. -Patient mentation has now improved and back to baseline.  3-pseudohyponatremia -Resolving with management of hyperglycemia -Continue to follow electrolytes.  4-lactic acidosis at time of admission trending down appropriately -Continue to maintain adequate hydration. -Patient is afebrile.  5-acute kidney injury superimposed on chronic kidney disease stage V -Continue minimizing nephrotoxic agents, maintain adequate hydration, avoid the use of contrast and avoid hypotension -Continue to follow renal function trend. -Creatinine trending down antistaphylolysin.  6-macrocytic anemia -MCV at 100.5 -B12 and folate within normal limits.  7-anemia of chronic disease -Continue to follow hemoglobin trend -No overt bleeding appreciated, at the moment no transfusion needed. -Outpatient follow-up with nephrology service for decision regarding Epogen and IV iron therapy.  8-hypertension -Continue slowly resuming home antihypertensive agent -Continue to follow vital sign -Maintain adequate hydration and follow low-sodium diet..  9-obesity (class II) -Body mass index is 35.66 kg/m.  -Low-calorie diet and portion control discussed with patient.  10-GERD -Continue PPI.  11-coronary artery disease/peripheral vascular disease -Slowly resuming home medication -No complaining of chest pain -Continue treatment with Plavix and statin.  12-positive blood cultures -Gram-positive rods isolated in patient's anaerobic cultures. -Rocephin empirically has been initiated -Follow speciation and sensitivity  13-history of stroke/chronic Coumadin -No focal deficits appreciated -Continue risk factor modification -Continue Coumadin per pharmacy.   Subjective:  Afebrile, no chest pain, no nausea, no vomiting, no headaches and reporting feeling  better.  DKA resolved.  Physical Exam: Vitals:   04/22/23 2137 04/22/23 2358 04/23/23  0424 04/23/23 1543  BP: 104/81 116/85 115/86 133/75  Pulse: 91 90 96 91  Resp:      Temp:  98.9 F (37.2 C) 98.4 F (36.9 C) 98.2 F (36.8 C)  TempSrc:  Oral Oral Oral  SpO2:  100% 100% 100%  Weight:      Height:       General exam: Alert, awake, oriented x 3; following commands appropriately and overall feeling much better.  No chest pain, no nausea, no vomiting, no headaches. Respiratory system: Clear to auscultation. Respiratory effort normal.  Good saturation on room air. Cardiovascular system:RRR. No rubs or gallops; no JVD. Gastrointestinal system: Abdomen is obese, nondistended, soft and nontender. No organomegaly or masses felt. Normal bowel sounds heard. Central nervous system: No focal neurological deficits. Extremities: No cyanosis or clubbing. Skin: No petechiae. Psychiatry: Judgement and insight appear normal.  Flat affect appreciated on exam.  Data Reviewed: INR: 3.1 Basic metabolic panel: Sodium 132, potassium 4.1, chloride 101, bicarb 19, glucose 193, BUN 38, creatinine 3.7, anion gap 12 and GFR 15. CBC: WBC 16, hemoglobin 10.9 and platelet count 304K Blood culture: Positive gram-positive rods (anaerobic culture).   Family Communication: No family at bedside.  Disposition: Status is: Inpatient Remains inpatient appropriate because: Continue treatment for acute kidney injury on chronic kidney disease and DKA process.   Planned Discharge Destination: Home  Time spent: 50 minutes  Author: Vassie Loll, MD 04/23/2023 5:14 PM  For on call review [email or web address].

## 2023-04-23 NOTE — Plan of Care (Signed)

## 2023-04-23 NOTE — Progress Notes (Signed)
PHARMACY - ANTICOAGULATION CONSULT NOTE  Pharmacy Consult for warfarin  Indication:  History of stroke  Allergies  Allergen Reactions   Visipaque [Iodixanol] Nausea And Vomiting    Patient had vagal response during procedure became hypertensive, and bradycardic. CO2 used during procedure prior to contrast being used, this may be cause as well. Recommended premedicating prior to contrast being used in the future.    Wellbutrin [Bupropion] Hives   Gabapentin     dizzy   Cefepime Rash    Tolerates penicilllin   Ciprofloxacin Hcl Hives and Rash    Hives/rash at injection site; 01/15/22 tolerated IV cipro   Tape Rash    Patient Measurements: Height: 5\' 5"  (165.1 cm) Weight: 97.2 kg (214 lb 4.6 oz) IBW/kg (Calculated) : 57   Vital Signs: Temp: 98.4 F (36.9 C) (12/21 0424) Temp Source: Oral (12/21 0424) BP: 115/86 (12/21 0424) Pulse Rate: 96 (12/21 0424)  Labs: Recent Labs    04/21/23 1640 04/21/23 1646 04/21/23 1741 04/21/23 1912 04/21/23 2138 04/22/23 0107 04/22/23 0306 04/22/23 0822 04/23/23 0541  HGB 11.5*  --  12.6  --   --   --  11.2*  --  10.9*  HCT 38.3  --  37.0  --   --   --  34.7*  --  33.1*  PLT 351  --   --   --   --   --  315  --  304  LABPROT  --   --   --   --  32.4*  --  38.0*  --  31.9*  INR  --   --   --   --  3.1*  --  3.8*  --  3.1*  CREATININE  --    < > 4.20*   < > 4.74*   < > 4.37* 3.85* 3.70*   < > = values in this interval not displayed.    Estimated Creatinine Clearance: 23.3 mL/min (A) (by C-G formula based on SCr of 3.7 mg/dL (H)).   Medical History: Past Medical History:  Diagnosis Date   Anemia    CAD (coronary artery disease)    a. s/p cath in 03/2014 showing 30% mid-LAD, moderate to severe disease along small D1, patent LCx, moderate to severe distal OM2 stenosis and moderate diffuse diease along RCA not amenable to PCI   CHF (congestive heart failure) (HCC)    a. EF 55-60% in 12/2019 b. EF at 35-40% by echo in 05/2020   CKD  (chronic kidney disease) stage 4, GFR 15-29 ml/min (HCC)    Diabetes mellitus without complication (HCC)    Myocardial infarction (HCC)    Neuropathy    PVD (peripheral vascular disease) (HCC)    Stroke (HCC) 01/2022   L hand weakness    Assessment: 42 year old female with history stroke on chronic coumadin.   INR down to 3.1, warfarin held last night. No bleeding issues noted. Hgb slowly trending down to 10.9.   Dosing from last INR clinic visit was warfarin 5mg  of warfarin daily except 2.5mg  on TuTrSa.    Goal of Therapy:  INR 2-3 Monitor platelets by anticoagulation protocol: Yes   Plan:  Warfarin 2.5mg  tonight Daily INR   Sheppard Coil PharmD., BCPS Clinical Pharmacist 04/23/2023 10:42 AM

## 2023-04-24 ENCOUNTER — Inpatient Hospital Stay (HOSPITAL_COMMUNITY): Payer: BC Managed Care – PPO

## 2023-04-24 ENCOUNTER — Other Ambulatory Visit: Payer: Self-pay | Admitting: Cardiology

## 2023-04-24 DIAGNOSIS — G9341 Metabolic encephalopathy: Secondary | ICD-10-CM | POA: Diagnosis not present

## 2023-04-24 DIAGNOSIS — N390 Urinary tract infection, site not specified: Secondary | ICD-10-CM

## 2023-04-24 DIAGNOSIS — E1110 Type 2 diabetes mellitus with ketoacidosis without coma: Secondary | ICD-10-CM | POA: Diagnosis not present

## 2023-04-24 DIAGNOSIS — E039 Hypothyroidism, unspecified: Secondary | ICD-10-CM

## 2023-04-24 DIAGNOSIS — I10 Essential (primary) hypertension: Secondary | ICD-10-CM | POA: Diagnosis not present

## 2023-04-24 LAB — PROTIME-INR
INR: 2.4 — ABNORMAL HIGH (ref 0.8–1.2)
Prothrombin Time: 26.2 s — ABNORMAL HIGH (ref 11.4–15.2)

## 2023-04-24 LAB — BASIC METABOLIC PANEL
Anion gap: 9 (ref 5–15)
BUN: 28 mg/dL — ABNORMAL HIGH (ref 6–20)
CO2: 25 mmol/L (ref 22–32)
Calcium: 7.9 mg/dL — ABNORMAL LOW (ref 8.9–10.3)
Chloride: 105 mmol/L (ref 98–111)
Creatinine, Ser: 2.63 mg/dL — ABNORMAL HIGH (ref 0.44–1.00)
GFR, Estimated: 23 mL/min — ABNORMAL LOW (ref >60)
Glucose, Bld: 238 mg/dL — ABNORMAL HIGH (ref 70–99)
Potassium: 3.3 mmol/L — ABNORMAL LOW (ref 3.5–5.1)
Sodium: 139 mmol/L (ref 135–145)

## 2023-04-24 LAB — GLUCOSE, CAPILLARY
Glucose-Capillary: 220 mg/dL — ABNORMAL HIGH (ref 70–99)
Glucose-Capillary: 198 mg/dL — ABNORMAL HIGH (ref 70–99)

## 2023-04-24 MED ORDER — LEVOFLOXACIN 500 MG PO TABS: 500.0000 mg | ORAL_TABLET | ORAL | 0 refills | Status: DC

## 2023-04-24 MED ORDER — WARFARIN SODIUM 5 MG PO TABS
5.0000 mg | ORAL_TABLET | Freq: Once | ORAL | Status: AC
  Administered 2023-04-24: 5 mg via ORAL
  Filled 2023-04-24: qty 1

## 2023-04-24 MED ORDER — HYDRALAZINE HCL 25 MG PO TABS: 25.0000 mg | ORAL_TABLET | Freq: Two times a day (BID) | ORAL | Status: DC

## 2023-04-24 MED ORDER — POTASSIUM CHLORIDE CRYS ER 20 MEQ PO TBCR
20.0000 meq | EXTENDED_RELEASE_TABLET | Freq: Once | ORAL | Status: AC
  Administered 2023-04-24: 20 meq via ORAL
  Filled 2023-04-24: qty 1

## 2023-04-24 NOTE — Progress Notes (Signed)
RN called due to patient developing left arm swelling on stroke affected side. Left upper extremity ultrasound was ordered.  Please follow-up

## 2023-04-24 NOTE — Progress Notes (Signed)
Swelling noted in upper left extremity, Dr. Thomes Dinning made aware, see new orders.

## 2023-04-24 NOTE — Discharge Summary (Signed)
Physician Discharge Summary   Patient: Lauren [name] MRN: [id number] DOB: Jan 17, 1981  Admit date:     04/21/2023  Discharge date: 04/24/23  Discharge Physician: Vassie Loll   PCP: Billie Lade, MD   Recommendations at discharge:  Close monitoring to patient's Coumadin level as she has been discharged on antibiotics Continue closely following CBGs/A1c with further adjustment to hypoglycemic regimen as needed Repeat basic metabolic panel to follow ultralights renal function Repeat CBC to follow hemoglobin trend/stability.  Discharge Diagnoses: Principal Problem:   DKA (diabetic ketoacidosis) (HCC) Active Problems:   Essential hypertension   Acquired hypothyroidism   CAD (coronary artery disease)   Acute kidney injury superimposed on CKD (HCC)   Hypotension   Seizure (HCC)   GERD (gastroesophageal reflux disease)   Acute metabolic encephalopathy   Lactic acidosis   Pseudohyponatremia   Hypoalbuminemia due to protein-calorie malnutrition (HCC)   Prolonged QT interval   Leukocytosis   Obesity, Class III, BMI 40-49.9 (morbid obesity) (HCC)   Urinary tract infection without hematuria  Brief hospital admission course: As per H&P written by Dr. Thomes Dinning on 04/21/2023 Lauren [name] is a 42 y.o. female with medical history significant of CAD, HFpEF, T2DM, CKD stage 4, history of stroke and history of seizure who presents to the emergency department from home via EMS due to headache.  Patient was unable to provide history possibly due to altered mental status, history was obtained from ED physician and medical record.  Per report, EMS was activated due to patient's headache, on arrival of EMS team, patient was noted to have hypotension with a BP of 83/46, blood glucose level showed "HI".  It was reported that patient's insulin pump broke last night.  She was taken to the ED for further evaluation and management.   ED Course:  In the emergency department, respiratory rate was 35,  though this eventually improved to 19.  Temperature 0.5 F, BP 70/37, other vital signs were within normal range.  Workup in the ED showed leukocytosis and microcytic anemia, BMP showed hyponatremia with sodium of 125, potassium 5.6, chloride 92, bicarb less than 7, blood glucose 888, BUN 9, creatinine 4.60, calcium 8.2, albumin 2.9, AST 83, ALT 23, ALP 67, total bilirubin 1.5, EGFR 12, TSH 2.105.  Blood culture pending. CT of head without contrast showed no evidence of acute intracranial abnormality 10 units of IV NovoLog was initially given, patient was then started on insulin drip per Endo tool. Hospitalist was asked to admit patient for further evaluation and management.  Assessment and Plan: -type II DKA without coma -DKA has resolved; patient tolerating diet; will resume home hypoglycemic regimen using insulin pump. -Continue to follow CBG fluctuation and adjust hypoglycemic regimen as needed -Continue modified carbohydrate diet and maintain adequate hydration..-   2-acute metabolic encephalopathy -Appears to be in the setting of DKA -There was concern for seizure activity while in the ED -Continue seizure precautions -Resume home Topamax and as needed Klonopin. -Patient mentation has now improved and back to baseline.   3-pseudohyponatremia -Resolving with management of hyperglycemia -Continue to follow electrolytes trend/stability.Marland Kitchen   4-lactic acidosis at time of admission trending down appropriately -Continue to maintain adequate hydration. -Patient is afebrile.   5-acute kidney injury superimposed on chronic kidney disease stage V -Continue minimizing nephrotoxic agents, maintain adequate hydration, avoid the use of contrast and avoid hypotension -Continue to follow renal function trend. -At discharge creatinine 2.63.   6-macrocytic anemia -MCV at 100.5 -B12 and folate within normal limits. -Continue to follow  hemoglobin trend.   7-anemia of chronic disease -Continue to  follow hemoglobin trend -No overt bleeding appreciated, at the moment no transfusion needed. -Outpatient follow-up with nephrology service for decision regarding Epogen and IV iron therapy.   8-hypertension -Resume home antihypertensive agents -Heart healthy/low-sodium diet discussed with patient.   9-obesity (class II) -Body mass index is 35.66 kg/m.  -Low-calorie diet and portion control discussed with patient.   10-GERD -Continue PPI. -Lifestyle changes discussed with patient.   11-coronary artery disease/peripheral vascular disease -Slowly resuming home medication -No complaining of chest pain or shortness of breath. -Continue treatment with Plavix, Ranexa, hydralazine, metoprolol and statin. -Continue patient follow-up with cardiology service.   12-positive blood cultures/UTI -Gram-positive rods  -Adequately treated with IV antibiotics and inpatient -At discharge patient will receive Levaquin 500 mg every 48 hours for 2 more doses to complete antibiotic therapy -ID service curbside.   13-history of stroke/chronic Coumadin -No focal deficits appreciated -Continue risk factor modification -Continue Coumadin per pharmacy; close INR monitoring while receiving Levaquin..   Consultants: ID curbside. Procedures performed: See below for x-ray reports. Disposition: Home Diet recommendation: Heart healthy/modified carbohydrates and low calorie diet.  DISCHARGE MEDICATION: Allergies as of 04/24/2023       Reactions   Visipaque [iodixanol] Nausea And Vomiting   Patient had vagal response during procedure became hypertensive, and bradycardic. CO2 used during procedure prior to contrast being used, this may be cause as well. Recommended premedicating prior to contrast being used in the future.    Wellbutrin [bupropion] Hives   Gabapentin    dizzy   Cefepime Rash   Tolerates penicilllin   Ciprofloxacin Hcl Hives, Rash   Hives/rash at injection site; 01/15/22 tolerated IV cipro    Tape Rash        Medication List     STOP taking these medications    scopolamine 1 MG/3DAYS Commonly known as: TRANSDERM-SCOP       TAKE these medications    acetaminophen 325 MG tablet Commonly known as: TYLENOL Take 1-2 tablets (325-650 mg total) by mouth every 4 (four) hours as needed for mild pain. What changed:  how much to take when to take this   atorvastatin 40 MG tablet Commonly known as: LIPITOR TAKE 1 TABLET BY MOUTH ONCE DAILY.   clonazePAM 1 MG disintegrating tablet Commonly known as: KLONOPIN Take 1 tablet (1 mg total) by mouth 2 (two) times daily as needed for seizure (For seizure lasting more than 2 minutes.).   clopidogrel 75 MG tablet Commonly known as: PLAVIX Take 1 tablet (75 mg total) by mouth daily.   Dexcom G6 Sensor Misc CHANGE SENSOR EVERY 10 DAYS.   Dexcom G6 Transmitter Misc Change transmitter every 90 days as directed.   furosemide 40 MG tablet Commonly known as: LASIX TAKE 1 TABLET BY MOUTH ONCE DAILY AS NEEDED FOR  SWELLING   hydrALAZINE 25 MG tablet Commonly known as: APRESOLINE Take 1 tablet (25 mg total) by mouth in the morning and at bedtime.   insulin aspart 100 UNIT/ML injection Commonly known as: novoLOG Use with Omnipod for daily dose around 100 units daily What changed:  how much to take how to take this when to take this additional instructions   isosorbide mononitrate 60 MG 24 hr tablet Commonly known as: IMDUR TAKE 2 TABLETS BY MOUTH ONCE DAILY.   levofloxacin 500 MG tablet Commonly known as: LEVAQUIN Take 1 tablet (500 mg total) by mouth every other day for 3 doses.   levothyroxine 125  MCG tablet Commonly known as: SYNTHROID Take 1 tablet (125 mcg total) by mouth daily at 6 (six) AM.   metoprolol succinate 50 MG 24 hr tablet Commonly known as: TOPROL-XL Take 1 tablet by mouth once daily   multivitamin with minerals Tabs tablet Take 1 tablet by mouth daily.   nitroGLYCERIN 0.4 MG SL  tablet Commonly known as: NITROSTAT Place 1 tablet (0.4 mg total) under the tongue every 5 (five) minutes x 3 doses as needed for chest pain.   Omnipod 5 DexG7G6 Pods Gen 5 Misc Change pod every 48-72 hours.   oxyCODONE-acetaminophen 7.5-325 MG tablet Commonly known as: Percocet Take 1 tablet by mouth daily as needed for severe pain.   pantoprazole 40 MG tablet Commonly known as: PROTONIX Take 1 tablet (40 mg total) by mouth daily. Courtesy fill/ pt to get established with a primary MD for further refills What changed:  when to take this reasons to take this   ranolazine 1000 MG SR tablet Commonly known as: RANEXA TAKE 1  BY MOUTH TWICE DAILY   senna-docusate 8.6-50 MG tablet Commonly known as: Senokot-S Take 1-2 tablets by mouth daily as needed for mild constipation.   tiZANidine 4 MG tablet Commonly known as: ZANAFLEX Take 1 tablet (4 mg total) by mouth at bedtime.   topiramate 50 MG tablet Commonly known as: TOPAMAX Take 1 tablet (50 mg total) by mouth 2 (two) times daily.   traZODone 100 MG tablet Commonly known as: DESYREL Take 100 mg by mouth at bedtime.   warfarin 5 MG tablet Commonly known as: COUMADIN Take as directed. If you are unsure how to take this medication, talk to your nurse or doctor. Original instructions: TAKE ONE TABLET BY MOUTH DAILY EXCEPT 1/2 TABLET ON WEDNESDAYS AND SATURDAYS OR AS DIRECTED What changed:  how much to take how to take this when to take this additional instructions        Follow-up Information     Billie Lade, MD. Schedule an appointment as soon as possible for a visit in 1 week(s).   Specialty: Internal Medicine Contact information: [address] [place] [address] [phone]                Discharge Exam: Ceasar Mons Weights   04/21/23 1635 04/22/23 1018 04/22/23 2017  Weight: 131.5 kg 95.7 kg 97.2 kg   General exam: Alert, awake, oriented x 3; following commands appropriately and overall  feeling much better.  No chest pain, no nausea, no vomiting, no headaches.  Feeling ready to go home. Respiratory system: Clear to auscultation. Respiratory effort normal.  Good saturation on room air. Cardiovascular system:RRR. No rubs or gallops; no JVD. Gastrointestinal system: Abdomen is obese, nondistended, soft and nontender. No organomegaly or masses felt. Normal bowel sounds heard. Central nervous system: No focal neurological deficits. Extremities: No cyanosis or clubbing. Skin: No petechiae. Psychiatry: Judgement and insight appear normal.  Flat affect appreciated on exam.  Condition at discharge: Stable and improved.  The results of significant diagnostics from this hospitalization (including imaging, microbiology, ancillary and laboratory) are listed below for reference.   Imaging Studies: DG Chest Port 1 View Result Date: 04/21/2023 CLINICAL DATA:  Hyperglycemia EXAM: PORTABLE CHEST 1 VIEW COMPARISON:  Chest x-ray 10/11/2022 FINDINGS: Heart is mildly enlarged, unchanged. The lungs are clear. There is no pleural effusion or pneumothorax. No acute fractures are seen. IMPRESSION: No active disease. Electronically Signed   By: Darliss Cheney M.D.   On: 04/21/2023 21:18  CT Head Wo Contrast Result Date: 04/21/2023 CLINICAL DATA:  Headache, neuro deficit. EXAM: CT HEAD WITHOUT CONTRAST TECHNIQUE: Contiguous axial images were obtained from the base of the skull through the vertex without intravenous contrast. RADIATION DOSE REDUCTION: This exam was performed according to the departmental dose-optimization program which includes automated exposure control, adjustment of the mA and/or kV according to patient size and/or use of iterative reconstruction technique. COMPARISON:  Head CT 03/13/2023 and MRI 03/05/2023 FINDINGS: Brain: There is no evidence of an acute infarct, intracranial hemorrhage, mass, midline shift, or extra-axial fluid collection. Chronic infarcts are again seen in the right  cerebral hemisphere primarily in the MCA distribution with associated ex vacuo dilatation of the right lateral ventricle and wallerian degeneration in the cerebral peduncle. A small chronic left occipital infarct is also again noted. Vascular: Calcified atherosclerosis at the skull base. No hyperdense vessel. Skull: No acute fracture or suspicious osseous lesion. Sinuses/Orbits: Visualized paranasal sinuses and mastoid air cells are clear. Unremarkable orbits. Other: None. IMPRESSION: 1. No evidence of acute intracranial abnormality. 2. 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 Electronically Signed   By: Sebastian Ache M.D.   On: 04/21/2023 19:19    Microbiology: Results for orders placed or performed during the hospital encounter of 04/21/23  Culture, blood (Routine X 2) w Reflex to ID Panel     Status: None (Preliminary result)   Collection Time: 04/21/23  5:15 PM   Specimen: BLOOD  Result Value Ref Range Status   Specimen Description BLOOD BLOOD LEFT HAND  Final   Special Requests   Final    BOTTLES DRAWN AEROBIC ONLY Blood Culture adequate volume   Culture   Final    NO GROWTH 3 DAYS Performed at [facility], [address], [place], [place] [address]    Report Status PENDING  Incomplete  Culture, blood (Routine X 2) w Reflex to ID Panel     Status: None (Preliminary result)   Collection Time: 04/21/23  5:15 PM   Specimen: BLOOD  Result Value Ref Range Status   Specimen Description   Final    BLOOD LEFT ANTECUBITAL Performed at [facility], [address], [place], [place] [address]    Special Requests   Final    BOTTLES DRAWN AEROBIC AND ANAEROBIC Blood Culture adequate volume Performed at [facility], [address] [place], [place] [address]    Culture  Setup Time   Final    GRAM POSITIVE RODS AEROBIC BOTTLE ONLY CRITICAL RESULT CALLED TO, READ BACK BY AND VERIFIED WITH: LEWIS,M ON 04/23/23 AT 0442 BY PURDIE,J    Culture   Final    NO  GROWTH 1 DAY Performed at [facility], [address], [place], [place] [address]    Report Status PENDING  Incomplete  MRSA Next Gen by PCR, Nasal     Status: None   Collection Time: 04/22/23  9:37 AM   Specimen: Nasal Mucosa; Nasal Swab  Result Value Ref Range Status   MRSA by PCR Next Gen NOT DETECTED NOT DETECTED Final    Comment: (NOTE) The GeneXpert MRSA Assay (FDA approved for NASAL specimens only), is one component of a comprehensive MRSA colonization surveillance program. It is not intended to diagnose MRSA infection nor to guide or monitor treatment for MRSA infections. Test performance is not FDA approved in patients less than 30 years old. Performed at [facility], [address] [place], [place] [address]     Labs: CBC:  Recent Labs  Lab 04/21/23 1640 04/21/23 1741 04/22/23 0306 04/23/23 0541  WBC 14.3*  --  18.0* 16.2*  HGB 11.5* 12.6 11.2* 10.9*  HCT 38.3 37.0 34.7* 33.1*  MCV 100.5*  --  92.8 90.2  PLT 351  --  315 304   Basic Metabolic Panel: Recent Labs  Lab 04/22/23 0107 04/22/23 0306 04/22/23 0822 04/23/23 0541 04/24/23 0544  NA 130* 130* 131* 132* 139  K 4.0 3.6 3.7 4.1 3.3*  CL 100 99 107 101 105  CO2 15* 17* 15* 19* 25  GLUCOSE 524* 451* 176* 193* 238*  BUN 40* 39* 41* 38* 28*  CREATININE 4.44* 4.37* 3.85* 3.70* 2.63*  CALCIUM 7.9* 8.0* 7.6* 7.5* 7.9*  MG  --  1.8  --   --   --   PHOS  --  4.4  --   --   --    Liver Function Tests: Recent Labs  Lab 04/21/23 1646  AST 83*  ALT 23  ALKPHOS 67  BILITOT 1.5*  PROT 6.1*  ALBUMIN 2.9*   CBG: Recent Labs  Lab 04/23/23 1140 04/23/23 1541 04/23/23 2058 04/24/23 0744 04/24/23 1125  GLUCAP 182* 240* 243* 220* 198*    Discharge time spent: greater than 30 minutes.  Signed: Vassie Loll, MD [facility] 04/24/2023

## 2023-04-24 NOTE — Progress Notes (Signed)
PHARMACY - ANTICOAGULATION CONSULT NOTE  Pharmacy Consult for warfarin  Indication:  History of stroke  Allergies  Allergen Reactions   Visipaque [Iodixanol] Nausea And Vomiting    Patient had vagal response during procedure became hypertensive, and bradycardic. CO2 used during procedure prior to contrast being used, this may be cause as well. Recommended premedicating prior to contrast being used in the future.    Wellbutrin [Bupropion] Hives   Gabapentin     dizzy   Cefepime Rash    Tolerates penicilllin   Ciprofloxacin Hcl Hives and Rash    Hives/rash at injection site; 01/15/22 tolerated IV cipro   Tape Rash    Patient Measurements: Height: 5\' 5"  (165.1 cm) Weight: 97.2 kg (214 lb 4.6 oz) IBW/kg (Calculated) : 57   Vital Signs: Temp: 98.2 F (36.8 C) (12/22 0457) Temp Source: Oral (12/22 0457) BP: 122/73 (12/22 0457) Pulse Rate: 94 (12/22 0457)  Labs: Recent Labs    04/21/23 1640 04/21/23 1646 04/21/23 1741 04/21/23 1912 04/22/23 0306 04/22/23 0822 04/23/23 0541 04/24/23 0544  HGB 11.5*  --  12.6  --  11.2*  --  10.9*  --   HCT 38.3  --  37.0  --  34.7*  --  33.1*  --   PLT 351  --   --   --  315  --  304  --   LABPROT  --   --   --    < > 38.0*  --  31.9* 26.2*  INR  --   --   --    < > 3.8*  --  3.1* 2.4*  CREATININE  --    < > 4.20*   < > 4.37* 3.85* 3.70* 2.63*   < > = values in this interval not displayed.    Estimated Creatinine Clearance: 32.8 mL/min (A) (by C-G formula based on SCr of 2.63 mg/dL (H)).   Medical History: Past Medical History:  Diagnosis Date   Anemia    CAD (coronary artery disease)    a. s/p cath in 03/2014 showing 30% mid-LAD, moderate to severe disease along small D1, patent LCx, moderate to severe distal OM2 stenosis and moderate diffuse diease along RCA not amenable to PCI   CHF (congestive heart failure) (HCC)    a. EF 55-60% in 12/2019 b. EF at 35-40% by echo in 05/2020   CKD (chronic kidney disease) stage 4, GFR 15-29  ml/min (HCC)    Diabetes mellitus without complication (HCC)    Myocardial infarction (HCC)    Neuropathy    PVD (peripheral vascular disease) (HCC)    Stroke (HCC) 01/2022   L hand weakness    Assessment: 42 year old female with history stroke on chronic coumadin.   INR down to 2.4, warfarin resumed last night. No bleeding issues noted. Hgb slowly trending down to 10.9, not checked today.   Dosing from last INR clinic visit was warfarin 5mg  of warfarin daily except 2.5mg  on TuTrSa.    Goal of Therapy:  INR 2-3 Monitor platelets by anticoagulation protocol: Yes   Plan:  Will continue home dose today with warfarin 5 mg tonight Daily INR  Sheppard Coil PharmD., BCPS Clinical Pharmacist 04/24/2023 9:12 AM

## 2023-04-24 NOTE — Progress Notes (Signed)
Patient cooperative with care. Discharge instructions explained & patient understands. Patient discharged via wheel chair with all belongings. Significant other here to pick up patient.

## 2023-04-25 ENCOUNTER — Emergency Department (HOSPITAL_COMMUNITY): Payer: BC Managed Care – PPO

## 2023-04-25 ENCOUNTER — Emergency Department (HOSPITAL_COMMUNITY)
Admission: EM | Admit: 2023-04-25 | Discharge: 2023-04-25 | Disposition: A | Discharge status: Home / Self Care | Payer: BC Managed Care – PPO | Attending: Emergency Medicine | Admitting: Emergency Medicine

## 2023-04-25 ENCOUNTER — Other Ambulatory Visit: Payer: Self-pay

## 2023-04-25 ENCOUNTER — Encounter (HOSPITAL_COMMUNITY): Payer: Self-pay

## 2023-04-25 DIAGNOSIS — Z7901 Long term (current) use of anticoagulants: Secondary | ICD-10-CM | POA: Diagnosis not present

## 2023-04-25 DIAGNOSIS — S42202A Unspecified fracture of upper end of left humerus, initial encounter for closed fracture: Secondary | ICD-10-CM | POA: Diagnosis not present

## 2023-04-25 DIAGNOSIS — S4992XA Unspecified injury of left shoulder and upper arm, initial encounter: Secondary | ICD-10-CM | POA: Diagnosis present

## 2023-04-25 DIAGNOSIS — W01198A Fall on same level from slipping, tripping and stumbling with subsequent striking against other object, initial encounter: Secondary | ICD-10-CM | POA: Insufficient documentation

## 2023-04-25 DIAGNOSIS — W19XXXA Unspecified fall, initial encounter: Secondary | ICD-10-CM

## 2023-04-25 LAB — COMPREHENSIVE METABOLIC PANEL
ALT: 34 U/L (ref 0–44)
AST: 34 U/L (ref 15–41)
Albumin: 2.8 g/dL — ABNORMAL LOW (ref 3.5–5.0)
Alkaline Phosphatase: 80 U/L (ref 38–126)
Anion gap: 6 (ref 5–15)
BUN: 21 mg/dL — ABNORMAL HIGH (ref 6–20)
CO2: 23 mmol/L (ref 22–32)
Calcium: 8 mg/dL — ABNORMAL LOW (ref 8.9–10.3)
Chloride: 105 mmol/L (ref 98–111)
Creatinine, Ser: 2.45 mg/dL — ABNORMAL HIGH (ref 0.44–1.00)
GFR, Estimated: 25 mL/min — ABNORMAL LOW (ref >60)
Glucose, Bld: 302 mg/dL — ABNORMAL HIGH (ref 70–99)
Potassium: 3.3 mmol/L — ABNORMAL LOW (ref 3.5–5.1)
Sodium: 134 mmol/L — ABNORMAL LOW (ref 135–145)
Total Bilirubin: 0.5 mg/dL (ref <1.2)
Total Protein: 6.1 g/dL — ABNORMAL LOW (ref 6.5–8.1)

## 2023-04-25 LAB — BLOOD GAS, VENOUS
Acid-base deficit: 0.2 mmol/L (ref 0.0–2.0)
Bicarbonate: 24.8 mmol/L (ref 20.0–28.0)
Drawn by: 1854
O2 Saturation: 45.1 %
Patient temperature: 37
pCO2, Ven: 41 mm[Hg] — ABNORMAL LOW (ref 44–60)
pH, Ven: 7.39 (ref 7.25–7.43)
pO2, Ven: <31 mm[Hg] — CL (ref 32–45)

## 2023-04-25 LAB — CBC WITH DIFFERENTIAL/PLATELET
Abs Immature Granulocytes: 0.05 10*3/uL (ref 0.00–0.07)
Basophils Absolute: 0 10*3/uL (ref 0.0–0.1)
Basophils Relative: 0 %
Eosinophils Absolute: 0.1 10*3/uL (ref 0.0–0.5)
Eosinophils Relative: 1 %
HCT: 32 % — ABNORMAL LOW (ref 36.0–46.0)
Hemoglobin: 10.5 g/dL — ABNORMAL LOW (ref 12.0–15.0)
Immature Granulocytes: 1 %
Lymphocytes Relative: 19 %
Lymphs Abs: 1.7 10*3/uL (ref 0.7–4.0)
MCH: 30.2 pg (ref 26.0–34.0)
MCHC: 32.8 g/dL (ref 30.0–36.0)
MCV: 92 fL (ref 80.0–100.0)
Monocytes Absolute: 0.7 10*3/uL (ref 0.1–1.0)
Monocytes Relative: 8 %
Neutro Abs: 6.2 10*3/uL (ref 1.7–7.7)
Neutrophils Relative %: 71 %
Platelets: 267 10*3/uL (ref 150–400)
RBC: 3.48 MIL/uL — ABNORMAL LOW (ref 3.87–5.11)
RDW: 13.7 % (ref 11.5–15.5)
WBC: 8.7 10*3/uL (ref 4.0–10.5)
nRBC: 0 % (ref 0.0–0.2)

## 2023-04-25 MED ORDER — HYDROCODONE-ACETAMINOPHEN 5-325 MG PO TABS
1.0000 | ORAL_TABLET | Freq: Once | ORAL | Status: AC
Start: 2023-04-25 — End: 2023-04-25
  Administered 2023-04-25: 1 via ORAL
  Filled 2023-04-25: qty 1

## 2023-04-25 MED ORDER — KETOROLAC TROMETHAMINE 15 MG/ML IJ SOLN
15.0000 mg | Freq: Once | INTRAMUSCULAR | Status: AC
Start: 2023-04-25 — End: 2023-04-25
  Administered 2023-04-25: 15 mg via INTRAVENOUS
  Filled 2023-04-25: qty 1

## 2023-04-25 NOTE — ED Provider Notes (Signed)
[facility] AT [facility] Provider Note   CSN: [id number] Arrival date & time: 04/25/23  7829     History  Chief Complaint  Patient presents with   Marletta Lor    Lauren [name] is a 42 y.o. female.  HPI Adult female presents 1 day after discharge following admission for DKA now with concern for left arm pain following a fall.  Patient unclear of the exact circumstances, but it seems as though earlier this morning she fell onto her left side.  At that time she has had pain in the left arm.  She had some degree of pain previously and was evaluated yesterday with ultrasound in that arm for swelling.  She notes that she is otherwise recovered unremarkably since admission with discharge yesterday.  Baseline left-sided hemiplegia, seemingly unchanged.    Home Medications Prior to Admission medications   Medication Sig Start Date End Date Taking? Authorizing Provider  acetaminophen (TYLENOL) 325 MG tablet Take 1-2 tablets (325-650 mg total) by mouth every 4 (four) hours as needed for mild pain. Patient taking differently: Take 650 mg by mouth as needed for mild pain (pain score 1-3). 02/18/22   Love, Evlyn Kanner, PA-C  atorvastatin (LIPITOR) 40 MG tablet TAKE 1 TABLET BY MOUTH ONCE DAILY. 01/25/23   Billie Lade, MD  clonazePAM (KLONOPIN) 1 MG disintegrating tablet Take 1 tablet (1 mg total) by mouth 2 (two) times daily as needed for seizure (For seizure lasting more than 2 minutes.). 03/16/23   Shon Hale, MD  clopidogrel (PLAVIX) 75 MG tablet Take 1 tablet (75 mg total) by mouth daily. 04/29/22   Micki Riley, MD  Continuous Glucose Sensor (DEXCOM G6 SENSOR) MISC CHANGE SENSOR EVERY 10 DAYS. 03/14/23   Dani Gobble, NP  Continuous Glucose Transmitter (DEXCOM G6 TRANSMITTER) MISC Change transmitter every 90 days as directed. 01/20/23   Roma Kayser, MD  furosemide (LASIX) 40 MG tablet TAKE 1 TABLET BY MOUTH ONCE DAILY AS NEEDED FOR  SWELLING  03/22/23   Antoine Poche, MD  hydrALAZINE (APRESOLINE) 25 MG tablet Take 1 tablet (25 mg total) by mouth in the morning and at bedtime. 04/24/23   Vassie Loll, MD  insulin aspart (NOVOLOG) 100 UNIT/ML injection Use with Omnipod for daily dose around 100 units daily Patient taking differently: Inject 0-100 Units into the skin 3 days. Use with Omnipod 10/18/22   Dani Gobble, NP  Insulin Disposable Pump (OMNIPOD 5 [id number] PODS GEN 5) MISC Change pod every 48-72 hours. 11/11/22   Dani Gobble, NP  isosorbide mononitrate (IMDUR) 60 MG 24 hr tablet TAKE 2 TABLETS BY MOUTH ONCE DAILY. 02/21/23   Billie Lade, MD  levofloxacin (LEVAQUIN) 500 MG tablet Take 1 tablet (500 mg total) by mouth every other day for 3 doses. 04/24/23 04/29/23  Vassie Loll, MD  levothyroxine (SYNTHROID) 125 MCG tablet Take 1 tablet (125 mcg total) by mouth daily at 6 (six) AM. 06/17/22 06/12/23  Dani Gobble, NP  metoprolol succinate (TOPROL-XL) 50 MG 24 hr tablet Take 1 tablet by mouth once daily 04/05/23   Antoine Poche, MD  Multiple Vitamin (MULTIVITAMIN WITH MINERALS) TABS tablet Take 1 tablet by mouth daily. 02/18/22   Love, Evlyn Kanner, PA-C  nitroGLYCERIN (NITROSTAT) 0.4 MG SL tablet Place 1 tablet (0.4 mg total) under the tongue every 5 (five) minutes x 3 doses as needed for chest pain. 02/18/22   Love, Evlyn Kanner, PA-C  oxyCODONE-acetaminophen (PERCOCET) 7.5-325 MG tablet  Take 1 tablet by mouth daily as needed for severe pain. 02/08/23   Fanny Dance, MD  pantoprazole (PROTONIX) 40 MG tablet Take 1 tablet (40 mg total) by mouth daily. Courtesy fill/ pt to get established with a primary MD for further refills Patient taking differently: Take 40 mg by mouth daily as needed (reflux). 06/01/22 04/21/23  Billie Lade, MD  ranolazine (RANEXA) 1000 MG SR tablet TAKE 1  BY MOUTH TWICE DAILY 04/11/23   Billie Lade, MD  senna-docusate (SENOKOT-S) 8.6-50 MG tablet Take 1-2 tablets by mouth daily as  needed for mild constipation. 08/11/22   Gardenia Phlegm, MD  tiZANidine (ZANAFLEX) 4 MG tablet Take 1 tablet (4 mg total) by mouth at bedtime. 01/20/23   Billie Lade, MD  topiramate (TOPAMAX) 50 MG tablet Take 1 tablet (50 mg total) by mouth 2 (two) times daily. 03/16/23   Shon Hale, MD  traZODone (DESYREL) 100 MG tablet Take 100 mg by mouth at bedtime. 04/05/23   [provider]  warfarin (COUMADIN) 5 MG tablet TAKE ONE TABLET BY MOUTH DAILY EXCEPT 1/2 TABLET ON WEDNESDAYS AND SATURDAYS OR AS DIRECTED Patient taking differently: Take 2.5-5 mg by mouth daily. Take 5 mg daily and 2.5 mg on Wednesdays and Saturdays 02/14/23   Antoine Poche, MD      Allergies    Visipaque [iodixanol], Wellbutrin [bupropion], Gabapentin, Cefepime, Ciprofloxacin hcl, and Tape    Review of Systems   Review of Systems  Physical Exam Updated Vital Signs BP (!) 159/99 (BP Location: Right Arm)   Pulse 89   Temp 98.1 F (36.7 C) (Oral)   Resp 14   Ht 5\' 5"  (1.651 m)   Wt 97.2 kg   LMP 04/24/2023 (Exact Date)   SpO2 100%   BMI 35.66 kg/m  Physical Exam Vitals and nursing note reviewed.  Constitutional:      General: She is not in acute distress.    Appearance: She is well-developed.  HENT:     Head: Normocephalic and atraumatic.  Eyes:     Conjunctiva/sclera: Conjunctivae normal.  Cardiovascular:     Rate and Rhythm: Normal rate and regular rhythm.  Pulmonary:     Effort: Pulmonary effort is normal. No respiratory distress.     Breath sounds: Normal breath sounds. No stridor.  Abdominal:     General: There is no distension.  Musculoskeletal:       Arms:     Cervical back: Normal range of motion and neck supple. No rigidity.  Skin:    General: Skin is warm and dry.       Neurological:     Mental Status: She is alert and oriented to person, place, and time.     Cranial Nerves: No cranial nerve deficit.     Comments: Patient with baseline left-sided hemiplegia from prior  stroke, patient cannot elevate her shoulder, elbow, wrist substantially.  Psychiatric:        Mood and Affect: Mood normal.     ED Results / Procedures / Treatments   Labs (all labs ordered are listed, but only abnormal results are displayed) Labs Reviewed  COMPREHENSIVE METABOLIC PANEL - Abnormal; Notable for the following components:      Result Value   Sodium 134 (*)    Potassium 3.3 (*)    Glucose, Bld 302 (*)    BUN 21 (*)    Creatinine, Ser 2.45 (*)    Calcium 8.0 (*)    Total Protein 6.1 (*)  Albumin 2.8 (*)    GFR, Estimated 25 (*)    All other components within normal limits  CBC WITH DIFFERENTIAL/PLATELET - Abnormal; Notable for the following components:   RBC 3.48 (*)    Hemoglobin 10.5 (*)    HCT 32.0 (*)    All other components within normal limits  BLOOD GAS, VENOUS - Abnormal; Notable for the following components:   pCO2, Ven 41 (*)    pO2, Ven <31 (*)    All other components within normal limits  URINALYSIS, ROUTINE W REFLEX MICROSCOPIC    EKG None  Radiology US Venous Img Upper Uni Left (DVT) Result Date: 04/24/2023 CLINICAL DATA:  [id number] Left arm swelling [id number] EXAM: LEFT UPPER EXTREMITY VENOUS DOPPLER ULTRASOUND TECHNIQUE: Gray-scale sonography with graded compression, as well as color Doppler and duplex ultrasound were performed to evaluate the upper extremity deep venous system from the level of the subclavian vein and including the jugular, axillary, basilic, radial, ulnar and upper cephalic vein. Spectral Doppler was utilized to evaluate flow at rest and with distal augmentation maneuvers. COMPARISON:  None Available. FINDINGS: Contralateral Subclavian Vein: Respiratory phasicity is normal and symmetric with the symptomatic side. No evidence of thrombus. Normal compressibility. Internal Jugular Vein: No evidence of thrombus. Normal compressibility, respiratory phasicity and response to augmentation. Subclavian Vein: No evidence of thrombus. Normal  compressibility, respiratory phasicity and response to augmentation. Axillary Vein: No evidence of thrombus. Normal compressibility, respiratory phasicity and response to augmentation. Cephalic Vein: No evidence of thrombus. Normal compressibility, respiratory phasicity and response to augmentation. Basilic Vein: No evidence of thrombus. Normal compressibility, respiratory phasicity and response to augmentation. Brachial Veins: No evidence of thrombus. Normal compressibility, respiratory phasicity and response to augmentation. Radial Veins: No evidence of thrombus. Normal compressibility, respiratory phasicity and response to augmentation. Ulnar Veins: No evidence of thrombus. Normal compressibility, respiratory phasicity and response to augmentation. Venous Reflux:  None visualized. Other Findings:  None visualized. IMPRESSION: No evidence of DVT within the left upper extremity. Electronically Signed   By: Delbert Phenix M.D.   On: 04/24/2023 15:02    Procedures Procedures    Medications Ordered in ED Medications  ketorolac (TORADOL) 15 MG/ML injection 15 mg (15 mg Intravenous Given 04/25/23 4132)    ED Course/ Medical Decision Making/ A&P                                 Medical Decision Making Adult female with multiple medical problems including insulin-dependent diabetes, heart failure, prior stroke presents 1 day after recent admission now with concern for pain fall.  Concern for fracture.  Denies loss of sensory changes, apparent consistency with prior strength exam reassuring for low suspicion of CNS changes.  Patient is awake alert, afebrile,.  Patient had ultrasound from yesterday which I reviewed, no evidence for DVT.  X-rays ordered, labs ordered, given her history.   Amount and/or Complexity of Data Reviewed External Data Reviewed: notes.    Details: Hospitalization summary reviewed Labs: ordered. Decision-making details documented in ED Course. Radiology: ordered and independent  interpretation performed. Decision-making details documented in ED Course.  Risk Prescription drug management. Decision regarding hospitalization. Diagnosis or treatment significantly limited by social determinants of health.   10:06 AM Patient awake, alert, ambulatory, actually holding her arm in an extended position.  I have discussed the patient's x-ray with our radiology tech, patient with proximal humerus fracture.  Labs consistent with those on discharge, no  evidence for new DKA or other decompensated state, consistent with the patient's description of feeling essentially the same since discharge.  Patient with evidence for proximal humerus fracture will have immobilization with splint, will follow-up with orthopedics.        Final Clinical Impression(s) / ED Diagnoses Final diagnoses:  Fall, initial encounter  Closed fracture of proximal end of left humerus, unspecified fracture morphology, initial encounter    Rx / DC Orders ED Discharge Orders     None         Gerhard Munch, MD 04/25/23 1006

## 2023-04-25 NOTE — ED Triage Notes (Signed)
Pt c/o falling sometimes throughout the night. Pt was ambulatory when entering hospital. At bedside pt is speaking with EDP and refusing to lay in bed states she is "hurting" too bad. Per NT pt is not steady at gait. Pt has a bruise noted to left shoulder. Pt took [facility] for pain from fall. Pt states hitting head but no LOC.

## 2023-04-25 NOTE — Inpatient Diabetes Management (Signed)
Inpatient Diabetes Program Recommendations  AACE/ADA: New Consensus Statement on Inpatient Glycemic Control   Target Ranges:  Prepandial:   less than 140 mg/dL      Peak postprandial:   less than 180 mg/dL (1-2 hours)      Critically ill patients:  140 - 180 mg/dL    Latest Reference Range & Units 04/25/23 08:56  CO2 22 - 32 mmol/L 23  Glucose 70 - 99 mg/dL 308 (H)  Anion gap 5 - 15  6   Review of Glycemic Control  Diabetes history: DM47 (dx at 42 years old; makes NO insulin - requires basal, correction, and carb coverage insulin) Outpatient Diabetes medications: OmniPod insulin pump with Novolog; Dexcom G6 CGM Current orders for Inpatient glycemic control: None; in ED  Inpatient Diabetes Program Recommendations:    Insulin: If patient does NOT have on her insulin pump, please consider ordering CBGs Q4H, Novolog 0-9 units Q4H, Semglee 23 units Q24H, and Novolog 3 units TID with meals for meal coverage if patient eats at least 50% of meals.  NOTE: Patient currently in ED after fall. Patient was recently inpatient 04/21/23-04/24/23; inpatient diabetes coordinator spoke with patient over the phone on 04/22/23. Patient has Type1 DM hx and uses an insulin pump for DM control outpatient. During recent hospitalization, patient was ordered SQ insulin regimen. Cherly Anderson, RN chat to make aware that patient has hx of DM1 and uses an insulin pump outpatient and inquired if patient currently has insulin pump on or not.   Thanks, Orlando Penner, RN, MSN, CDCES Diabetes Coordinator Inpatient Diabetes Program [phone] (Team Pager from 8am to 5pm)

## 2023-04-25 NOTE — Discharge Instructions (Signed)
As discussed, you have been diagnosed with a humerus fracture.  This is a fracture of the upper part of your arm.  Typically with the sling, ibuprofen and Tylenol symptoms are will managed.  Please follow-up with orthopedic colleague in 1 week, or return here for concerning changes in your condition.

## 2023-04-26 LAB — CULTURE, BLOOD (ROUTINE X 2)
Culture: NO GROWTH
Special Requests: ADEQUATE

## 2023-04-28 LAB — CULTURE, BLOOD (ROUTINE X 2)
Culture  Setup Time: NO GROWTH
Special Requests: ADEQUATE

## 2023-04-29 ENCOUNTER — Encounter (HOSPITAL_COMMUNITY): Payer: Self-pay | Admitting: *Deleted

## 2023-04-29 ENCOUNTER — Emergency Department (HOSPITAL_COMMUNITY): Payer: BC Managed Care – PPO

## 2023-04-29 ENCOUNTER — Other Ambulatory Visit: Payer: Self-pay

## 2023-04-29 ENCOUNTER — Inpatient Hospital Stay (HOSPITAL_COMMUNITY)
Admission: EM | Admit: 2023-04-29 | Discharge: 2023-05-03 | DRG: 637 | Disposition: A | Discharge status: Home Health | Payer: BC Managed Care – PPO | Attending: Internal Medicine | Admitting: Internal Medicine

## 2023-04-29 DIAGNOSIS — Z823 Family history of stroke: Secondary | ICD-10-CM

## 2023-04-29 DIAGNOSIS — N179 Acute kidney failure, unspecified: Secondary | ICD-10-CM | POA: Diagnosis present

## 2023-04-29 DIAGNOSIS — Z7901 Long term (current) use of anticoagulants: Secondary | ICD-10-CM

## 2023-04-29 DIAGNOSIS — E1059 Type 1 diabetes mellitus with other circulatory complications: Secondary | ICD-10-CM | POA: Diagnosis present

## 2023-04-29 DIAGNOSIS — E1011 Type 1 diabetes mellitus with ketoacidosis with coma: Secondary | ICD-10-CM | POA: Diagnosis not present

## 2023-04-29 DIAGNOSIS — Z89422 Acquired absence of other left toe(s): Secondary | ICD-10-CM

## 2023-04-29 DIAGNOSIS — Z1152 Encounter for screening for COVID-19: Secondary | ICD-10-CM | POA: Diagnosis not present

## 2023-04-29 DIAGNOSIS — I249 Acute ischemic heart disease, unspecified: Secondary | ICD-10-CM

## 2023-04-29 DIAGNOSIS — E1010 Type 1 diabetes mellitus with ketoacidosis without coma: Principal | ICD-10-CM | POA: Diagnosis present

## 2023-04-29 DIAGNOSIS — E861 Hypovolemia: Secondary | ICD-10-CM | POA: Diagnosis present

## 2023-04-29 DIAGNOSIS — I21A1 Myocardial infarction type 2: Secondary | ICD-10-CM | POA: Diagnosis present

## 2023-04-29 DIAGNOSIS — I70222 Atherosclerosis of native arteries of extremities with rest pain, left leg: Secondary | ICD-10-CM | POA: Diagnosis present

## 2023-04-29 DIAGNOSIS — I69354 Hemiplegia and hemiparesis following cerebral infarction affecting left non-dominant side: Secondary | ICD-10-CM

## 2023-04-29 DIAGNOSIS — R627 Adult failure to thrive: Secondary | ICD-10-CM | POA: Diagnosis present

## 2023-04-29 DIAGNOSIS — I255 Ischemic cardiomyopathy: Secondary | ICD-10-CM | POA: Diagnosis present

## 2023-04-29 DIAGNOSIS — Z66 Do not resuscitate: Secondary | ICD-10-CM | POA: Diagnosis present

## 2023-04-29 DIAGNOSIS — Z8673 Personal history of transient ischemic attack (TIA), and cerebral infarction without residual deficits: Secondary | ICD-10-CM

## 2023-04-29 DIAGNOSIS — I5042 Chronic combined systolic (congestive) and diastolic (congestive) heart failure: Principal | ICD-10-CM | POA: Diagnosis present

## 2023-04-29 DIAGNOSIS — G9341 Metabolic encephalopathy: Secondary | ICD-10-CM | POA: Diagnosis present

## 2023-04-29 DIAGNOSIS — Z881 Allergy status to other antibiotic agents status: Secondary | ICD-10-CM

## 2023-04-29 DIAGNOSIS — R791 Abnormal coagulation profile: Secondary | ICD-10-CM | POA: Diagnosis present

## 2023-04-29 DIAGNOSIS — E1022 Type 1 diabetes mellitus with diabetic chronic kidney disease: Secondary | ICD-10-CM | POA: Diagnosis present

## 2023-04-29 DIAGNOSIS — N184 Chronic kidney disease, stage 4 (severe): Secondary | ICD-10-CM | POA: Diagnosis present

## 2023-04-29 DIAGNOSIS — D631 Anemia in chronic kidney disease: Secondary | ICD-10-CM | POA: Diagnosis present

## 2023-04-29 DIAGNOSIS — R6520 Severe sepsis without septic shock: Secondary | ICD-10-CM | POA: Diagnosis not present

## 2023-04-29 DIAGNOSIS — E66812 Obesity, class 2: Secondary | ICD-10-CM | POA: Diagnosis present

## 2023-04-29 DIAGNOSIS — Z9049 Acquired absence of other specified parts of digestive tract: Secondary | ICD-10-CM

## 2023-04-29 DIAGNOSIS — Z794 Long term (current) use of insulin: Secondary | ICD-10-CM | POA: Diagnosis not present

## 2023-04-29 DIAGNOSIS — E785 Hyperlipidemia, unspecified: Secondary | ICD-10-CM | POA: Diagnosis present

## 2023-04-29 DIAGNOSIS — Z6836 Body mass index (BMI) 36.0-36.9, adult: Secondary | ICD-10-CM

## 2023-04-29 DIAGNOSIS — I130 Hypertensive heart and chronic kidney disease with heart failure and stage 1 through stage 4 chronic kidney disease, or unspecified chronic kidney disease: Secondary | ICD-10-CM | POA: Diagnosis present

## 2023-04-29 DIAGNOSIS — G9340 Encephalopathy, unspecified: Secondary | ICD-10-CM | POA: Diagnosis not present

## 2023-04-29 DIAGNOSIS — S42215A Unspecified nondisplaced fracture of surgical neck of left humerus, initial encounter for closed fracture: Secondary | ICD-10-CM | POA: Diagnosis not present

## 2023-04-29 DIAGNOSIS — I214 Non-ST elevation (NSTEMI) myocardial infarction: Secondary | ICD-10-CM

## 2023-04-29 DIAGNOSIS — S42302A Unspecified fracture of shaft of humerus, left arm, initial encounter for closed fracture: Secondary | ICD-10-CM

## 2023-04-29 DIAGNOSIS — Z83438 Family history of other disorder of lipoprotein metabolism and other lipidemia: Secondary | ICD-10-CM

## 2023-04-29 DIAGNOSIS — Z7902 Long term (current) use of antithrombotics/antiplatelets: Secondary | ICD-10-CM

## 2023-04-29 DIAGNOSIS — I2510 Atherosclerotic heart disease of native coronary artery without angina pectoris: Secondary | ICD-10-CM | POA: Diagnosis present

## 2023-04-29 DIAGNOSIS — Z91041 Radiographic dye allergy status: Secondary | ICD-10-CM

## 2023-04-29 DIAGNOSIS — K7200 Acute and subacute hepatic failure without coma: Secondary | ICD-10-CM | POA: Diagnosis present

## 2023-04-29 DIAGNOSIS — N200 Calculus of kidney: Secondary | ICD-10-CM | POA: Diagnosis present

## 2023-04-29 DIAGNOSIS — I10 Essential (primary) hypertension: Secondary | ICD-10-CM | POA: Diagnosis present

## 2023-04-29 DIAGNOSIS — D638 Anemia in other chronic diseases classified elsewhere: Secondary | ICD-10-CM | POA: Diagnosis present

## 2023-04-29 DIAGNOSIS — I252 Old myocardial infarction: Secondary | ICD-10-CM

## 2023-04-29 DIAGNOSIS — R7989 Other specified abnormal findings of blood chemistry: Secondary | ICD-10-CM | POA: Insufficient documentation

## 2023-04-29 DIAGNOSIS — E860 Dehydration: Secondary | ICD-10-CM | POA: Diagnosis present

## 2023-04-29 DIAGNOSIS — Z79890 Hormone replacement therapy (postmenopausal): Secondary | ICD-10-CM

## 2023-04-29 DIAGNOSIS — E039 Hypothyroidism, unspecified: Secondary | ICD-10-CM | POA: Diagnosis present

## 2023-04-29 DIAGNOSIS — E1051 Type 1 diabetes mellitus with diabetic peripheral angiopathy without gangrene: Secondary | ICD-10-CM | POA: Diagnosis present

## 2023-04-29 DIAGNOSIS — E1110 Type 2 diabetes mellitus with ketoacidosis without coma: Secondary | ICD-10-CM | POA: Diagnosis present

## 2023-04-29 DIAGNOSIS — Z8249 Family history of ischemic heart disease and other diseases of the circulatory system: Secondary | ICD-10-CM

## 2023-04-29 DIAGNOSIS — Z888 Allergy status to other drugs, medicaments and biological substances status: Secondary | ICD-10-CM

## 2023-04-29 DIAGNOSIS — K219 Gastro-esophageal reflux disease without esophagitis: Secondary | ICD-10-CM | POA: Diagnosis present

## 2023-04-29 DIAGNOSIS — Z9641 Presence of insulin pump (external) (internal): Secondary | ICD-10-CM | POA: Diagnosis present

## 2023-04-29 DIAGNOSIS — Z91048 Other nonmedicinal substance allergy status: Secondary | ICD-10-CM

## 2023-04-29 DIAGNOSIS — R001 Bradycardia, unspecified: Secondary | ICD-10-CM | POA: Diagnosis present

## 2023-04-29 DIAGNOSIS — R531 Weakness: Secondary | ICD-10-CM

## 2023-04-29 DIAGNOSIS — A419 Sepsis, unspecified organism: Secondary | ICD-10-CM | POA: Diagnosis not present

## 2023-04-29 DIAGNOSIS — E875 Hyperkalemia: Secondary | ICD-10-CM | POA: Diagnosis present

## 2023-04-29 DIAGNOSIS — E0811 Diabetes mellitus due to underlying condition with ketoacidosis with coma: Principal | ICD-10-CM

## 2023-04-29 DIAGNOSIS — Z87891 Personal history of nicotine dependence: Secondary | ICD-10-CM

## 2023-04-29 DIAGNOSIS — R9431 Abnormal electrocardiogram [ECG] [EKG]: Secondary | ICD-10-CM | POA: Diagnosis not present

## 2023-04-29 DIAGNOSIS — Z79899 Other long term (current) drug therapy: Secondary | ICD-10-CM

## 2023-04-29 LAB — COMPREHENSIVE METABOLIC PANEL
ALT: 20 U/L (ref 0–44)
AST: 25 U/L (ref 15–41)
Albumin: 2 g/dL — ABNORMAL LOW (ref 3.5–5.0)
Alkaline Phosphatase: 74 U/L (ref 38–126)
BUN: 46 mg/dL — ABNORMAL HIGH (ref 6–20)
CO2: <7 mmol/L — ABNORMAL LOW (ref 22–32)
Calcium: 7.2 mg/dL — ABNORMAL LOW (ref 8.9–10.3)
Chloride: 94 mmol/L — ABNORMAL LOW (ref 98–111)
Creatinine, Ser: 3.95 mg/dL — ABNORMAL HIGH (ref 0.44–1.00)
GFR, Estimated: 14 mL/min — ABNORMAL LOW (ref >60)
Glucose, Bld: 832 mg/dL (ref 70–99)
Potassium: 6 mmol/L — ABNORMAL HIGH (ref 3.5–5.1)
Sodium: 124 mmol/L — ABNORMAL LOW (ref 135–145)
Total Bilirubin: 1.6 mg/dL — ABNORMAL HIGH (ref <1.2)
Total Protein: 4.8 g/dL — ABNORMAL LOW (ref 6.5–8.1)

## 2023-04-29 LAB — CBC WITH DIFFERENTIAL/PLATELET
Abs Immature Granulocytes: 0.3 10*3/uL — ABNORMAL HIGH (ref 0.00–0.07)
Basophils Absolute: 0 10*3/uL (ref 0.0–0.1)
Basophils Relative: 0 %
Eosinophils Absolute: 0 10*3/uL (ref 0.0–0.5)
Eosinophils Relative: 0 %
HCT: 25.5 % — ABNORMAL LOW (ref 36.0–46.0)
Hemoglobin: 8.5 g/dL — ABNORMAL LOW (ref 12.0–15.0)
Immature Granulocytes: 2 %
Lymphocytes Relative: 6 %
Lymphs Abs: 1 10*3/uL (ref 0.7–4.0)
MCH: 29.9 pg (ref 26.0–34.0)
MCHC: 33.3 g/dL (ref 30.0–36.0)
MCV: 89.8 fL (ref 80.0–100.0)
Monocytes Absolute: 0.7 10*3/uL (ref 0.1–1.0)
Monocytes Relative: 4 %
Neutro Abs: 14.2 10*3/uL — ABNORMAL HIGH (ref 1.7–7.7)
Neutrophils Relative %: 88 %
Platelets: 351 10*3/uL (ref 150–400)
RBC: 2.84 MIL/uL — ABNORMAL LOW (ref 3.87–5.11)
RDW: 13.8 % (ref 11.5–15.5)
WBC: 16.2 10*3/uL — ABNORMAL HIGH (ref 4.0–10.5)
nRBC: 0 % (ref 0.0–0.2)

## 2023-04-29 LAB — LACTIC ACID, PLASMA
Lactic Acid, Venous: 3.1 mmol/L (ref 0.5–1.9)
Lactic Acid, Venous: 3.2 mmol/L (ref 0.5–1.9)
Lactic Acid, Venous: 2.2 mmol/L (ref 0.5–1.9)

## 2023-04-29 LAB — BLOOD GAS, VENOUS
Acid-base deficit: 19.5 mmol/L — ABNORMAL HIGH (ref 0.0–2.0)
Bicarbonate: 8.6 mmol/L — ABNORMAL LOW (ref 20.0–28.0)
O2 Saturation: 57.3 %
Patient temperature: 37
pCO2, Ven: 27 mm[Hg] — ABNORMAL LOW (ref 44–60)
pH, Ven: 7.11 — CL (ref 7.25–7.43)
pO2, Ven: 40 mm[Hg] (ref 32–45)

## 2023-04-29 LAB — BASIC METABOLIC PANEL
Anion gap: 22 — ABNORMAL HIGH (ref 5–15)
Anion gap: 16 — ABNORMAL HIGH (ref 5–15)
Anion gap: 13 (ref 5–15)
BUN: 46 mg/dL — ABNORMAL HIGH (ref 6–20)
BUN: 45 mg/dL — ABNORMAL HIGH (ref 6–20)
BUN: 44 mg/dL — ABNORMAL HIGH (ref 6–20)
CO2: 8 mmol/L — ABNORMAL LOW (ref 22–32)
CO2: 14 mmol/L — ABNORMAL LOW (ref 22–32)
CO2: 16 mmol/L — ABNORMAL LOW (ref 22–32)
Calcium: 7.6 mg/dL — ABNORMAL LOW (ref 8.9–10.3)
Calcium: 7.5 mg/dL — ABNORMAL LOW (ref 8.9–10.3)
Calcium: 7.6 mg/dL — ABNORMAL LOW (ref 8.9–10.3)
Chloride: 93 mmol/L — ABNORMAL LOW (ref 98–111)
Chloride: 101 mmol/L (ref 98–111)
Chloride: 103 mmol/L (ref 98–111)
Creatinine, Ser: 4.2 mg/dL — ABNORMAL HIGH (ref 0.44–1.00)
Creatinine, Ser: 3.95 mg/dL — ABNORMAL HIGH (ref 0.44–1.00)
Creatinine, Ser: 3.72 mg/dL — ABNORMAL HIGH (ref 0.44–1.00)
GFR, Estimated: 13 mL/min — ABNORMAL LOW (ref >60)
GFR, Estimated: 14 mL/min — ABNORMAL LOW (ref >60)
GFR, Estimated: 15 mL/min — ABNORMAL LOW (ref >60)
Glucose, Bld: 775 mg/dL (ref 70–99)
Glucose, Bld: 611 mg/dL (ref 70–99)
Glucose, Bld: 512 mg/dL (ref 70–99)
Potassium: 4.5 mmol/L (ref 3.5–5.1)
Potassium: 4.1 mmol/L (ref 3.5–5.1)
Potassium: 3.8 mmol/L (ref 3.5–5.1)
Sodium: 123 mmol/L — ABNORMAL LOW (ref 135–145)
Sodium: 131 mmol/L — ABNORMAL LOW (ref 135–145)
Sodium: 132 mmol/L — ABNORMAL LOW (ref 135–145)

## 2023-04-29 LAB — GLUCOSE, CAPILLARY
Glucose-Capillary: 372 mg/dL — ABNORMAL HIGH (ref 70–99)
Glucose-Capillary: 428 mg/dL — ABNORMAL HIGH (ref 70–99)
Glucose-Capillary: 456 mg/dL — ABNORMAL HIGH (ref 70–99)
Glucose-Capillary: 501 mg/dL (ref 70–99)
Glucose-Capillary: 506 mg/dL (ref 70–99)
Glucose-Capillary: 534 mg/dL (ref 70–99)
Glucose-Capillary: 573 mg/dL (ref 70–99)
Glucose-Capillary: 577 mg/dL (ref 70–99)
Glucose-Capillary: >600 mg/dL (ref 70–99)
Glucose-Capillary: >600 mg/dL (ref 70–99)
Glucose-Capillary: >600 mg/dL (ref 70–99)
Glucose-Capillary: >600 mg/dL (ref 70–99)
Glucose-Capillary: >600 mg/dL (ref 70–99)

## 2023-04-29 LAB — RESP PANEL BY RT-PCR (RSV, FLU A&B, COVID)  RVPGX2
Influenza A by PCR: NEGATIVE
Influenza B by PCR: NEGATIVE
Resp Syncytial Virus by PCR: NEGATIVE
SARS Coronavirus 2 by RT PCR: NEGATIVE

## 2023-04-29 LAB — TROPONIN I (HIGH SENSITIVITY)
Troponin I (High Sensitivity): 2646 ng/L (ref <18)
Troponin I (High Sensitivity): 6107 ng/L (ref <18)
Troponin I (High Sensitivity): 6842 ng/L (ref <18)

## 2023-04-29 LAB — BETA-HYDROXYBUTYRIC ACID
Beta-Hydroxybutyric Acid: >8 mmol/L — ABNORMAL HIGH (ref 0.05–0.27)
Beta-Hydroxybutyric Acid: 0.92 mmol/L — ABNORMAL HIGH (ref 0.05–0.27)

## 2023-04-29 LAB — PROTIME-INR
INR: 9.6 (ref 0.8–1.2)
Prothrombin Time: 77.5 s — ABNORMAL HIGH (ref 11.4–15.2)

## 2023-04-29 LAB — MAGNESIUM: Magnesium: 1.9 mg/dL (ref 1.7–2.4)

## 2023-04-29 LAB — CBG MONITORING, ED
Glucose-Capillary: >600 mg/dL (ref 70–99)
Glucose-Capillary: >600 mg/dL (ref 70–99)
Glucose-Capillary: >600 mg/dL (ref 70–99)
Glucose-Capillary: >600 mg/dL (ref 70–99)

## 2023-04-29 MED ORDER — VANCOMYCIN HCL IN DEXTROSE 1-5 GM/200ML-% IV SOLN: 1000.0000 mg | Freq: Once | INTRAVENOUS | Status: DC

## 2023-04-29 MED ORDER — INSULIN REGULAR(HUMAN) IN NACL 100-0.9 UT/100ML-% IV SOLN
INTRAVENOUS | Status: DC
  Administered 2023-04-29: 8 [IU]/h via INTRAVENOUS
  Administered 2023-04-29: 12 [IU]/h via INTRAVENOUS
  Filled 2023-04-29 (×3): qty 100

## 2023-04-29 MED ORDER — LACTATED RINGERS IV SOLN: INTRAVENOUS | Status: DC

## 2023-04-29 MED ORDER — DEXTROSE 50 % IV SOLN: 0.0000 mL | INTRAVENOUS | Status: DC | PRN

## 2023-04-29 MED ORDER — DEXTROSE IN LACTATED RINGERS 5 % IV SOLN: INTRAVENOUS | Status: AC

## 2023-04-29 MED ORDER — NOREPINEPHRINE 4 MG/250ML-% IV SOLN
INTRAVENOUS | Status: AC
  Administered 2023-04-29: 2 ug/min via INTRAVENOUS
  Filled 2023-04-29: qty 250

## 2023-04-29 MED ORDER — LACTATED RINGERS IV BOLUS: 20.0000 mL/kg | Freq: Once | INTRAVENOUS | Status: DC

## 2023-04-29 MED ORDER — NOREPINEPHRINE 4 MG/250ML-% IV SOLN: 0.0000 ug/min | INTRAVENOUS | Status: DC

## 2023-04-29 MED ORDER — OXIDIZED CELLULOSE EX PADS
1.0000 | MEDICATED_PAD | Freq: Once | CUTANEOUS | Status: AC
  Administered 2023-04-30: 1 via TOPICAL
  Filled 2023-04-29: qty 1

## 2023-04-29 MED ORDER — LACTATED RINGERS IV SOLN: INTRAVENOUS | Status: AC

## 2023-04-29 MED ORDER — LACTATED RINGERS IV BOLUS (SEPSIS)
1000.0000 mL | Freq: Once | INTRAVENOUS | Status: AC
  Administered 2023-04-29: 1000 mL via INTRAVENOUS

## 2023-04-29 MED ORDER — SODIUM CHLORIDE 0.9 % IV BOLUS
2000.0000 mL | Freq: Once | INTRAVENOUS | Status: DC
Start: 2023-04-29 — End: 2023-04-29

## 2023-04-29 MED ORDER — VITAMIN K1 10 MG/ML IJ SOLN
5.0000 mg | Freq: Once | INTRAVENOUS | Status: AC
  Administered 2023-04-29: 5 mg via INTRAVENOUS
  Filled 2023-04-29: qty 0.5

## 2023-04-29 MED ORDER — CHLORHEXIDINE GLUCONATE CLOTH 2 % EX PADS
6.0000 | MEDICATED_PAD | Freq: Every day | CUTANEOUS | Status: DC
  Administered 2023-04-30 – 2023-05-03 (×4): 6 via TOPICAL

## 2023-04-29 MED ORDER — VANCOMYCIN HCL IN DEXTROSE 1-5 GM/200ML-% IV SOLN
1000.0000 mg | INTRAVENOUS | Status: AC
  Administered 2023-04-29 (×2): 1000 mg via INTRAVENOUS
  Filled 2023-04-29: qty 200

## 2023-04-29 MED ORDER — SODIUM CHLORIDE 0.9 % IV SOLN
2.0000 g | Freq: Once | INTRAVENOUS | Status: AC
  Administered 2023-04-29: 2 g via INTRAVENOUS
  Filled 2023-04-29: qty 10

## 2023-04-29 MED ORDER — METRONIDAZOLE 500 MG/100ML IV SOLN
500.0000 mg | Freq: Once | INTRAVENOUS | Status: AC
  Administered 2023-04-29: 500 mg via INTRAVENOUS
  Filled 2023-04-29: qty 100

## 2023-04-29 MED ORDER — SODIUM CHLORIDE 0.9 % IV BOLUS
1000.0000 mL | Freq: Once | INTRAVENOUS | Status: AC
  Administered 2023-04-29: 1000 mL via INTRAVENOUS

## 2023-04-29 NOTE — ED Notes (Signed)
Pt is a very difficult IV stick. Lab is at bedside attempting to collect blood work.

## 2023-04-29 NOTE — ED Notes (Signed)
Lab has only been able to draw one blue blood culture tube, none others so far. Pt has hit the 1hr mark after code sepsis called, therefore antibiotics were started.

## 2023-04-29 NOTE — ED Notes (Signed)
Date and time results received: 04/29/23 12:16 PM  (use smartphrase ".now" to insert current time)  Test:  Lactic Acid Critical Value: 3.1  Name of Provider Notified: Dr. Denton Lank  Orders Received? Or Actions Taken?:

## 2023-04-29 NOTE — Consult Note (Signed)
CODE SEPSIS - PHARMACY COMMUNICATION  **Broad-spectrum antimicrobials should be administered within one hour of sepsis diagnosis**  Time Code Sepsis call or page was received: 0939  Antibiotics ordered: Aztreonam, Vancomycin, Metronidazole  Time of first antibiotic administration: 1038  Additional action taken by pharmacy: N/A  If necessary, name of provider/nurse contacted: N/A    Will M. Dareen Piano, PharmD Clinical Pharmacist 04/29/2023 9:45 AM

## 2023-04-29 NOTE — ED Notes (Signed)
Date and time results received: 04/29/23 12:33 PM  (use smartphrase ".now" to insert current time)  Test: Glucose 832 Trop 2646 Critical Value:   Name of Provider Notified: Dr. Denton Lank  Orders Received? Or Actions Taken?:

## 2023-04-29 NOTE — ED Provider Notes (Signed)
[place] [facility]GENC[facility] DEPARTMENT AT [facility] Provider Note   CSN: [id number] Arrival date & time: 04/29/23  0901     History  Chief Complaint  Patient presents with   Weakness    Lauren [name] is a 42 y.o. female.  Pt with general weakness, and noted to have low blood pressure and CBG > 600. Recent admission with dka and uti?sepsis. Pt very limited historian - level 5 caveat. No report of fevers. No report of new trauma/fall.   The history is provided by the patient, medical records and the EMS personnel. The history is limited by the condition of the patient.  Weakness      Home Medications Prior to Admission medications   Medication Sig Start Date End Date Taking? Authorizing Provider  acetaminophen (TYLENOL) 325 MG tablet Take 1-2 tablets (325-650 mg total) by mouth every 4 (four) hours as needed for mild pain. Patient taking differently: Take 650 mg by mouth as needed for mild pain (pain score 1-3). 02/18/22   Love, Evlyn Kanner, PA-C  atorvastatin (LIPITOR) 40 MG tablet TAKE 1 TABLET BY MOUTH ONCE DAILY. 01/25/23   Billie Lade, MD  clonazePAM (KLONOPIN) 1 MG disintegrating tablet Take 1 tablet (1 mg total) by mouth 2 (two) times daily as needed for seizure (For seizure lasting more than 2 minutes.). 03/16/23   Shon Hale, MD  clopidogrel (PLAVIX) 75 MG tablet Take 1 tablet (75 mg total) by mouth daily. 04/29/22   Micki Riley, MD  Continuous Glucose Sensor (DEXCOM G6 SENSOR) MISC CHANGE SENSOR EVERY 10 DAYS. 03/14/23   Dani Gobble, NP  Continuous Glucose Transmitter (DEXCOM G6 TRANSMITTER) MISC Change transmitter every 90 days as directed. 01/20/23   Roma Kayser, MD  furosemide (LASIX) 40 MG tablet TAKE 1 TABLET BY MOUTH ONCE DAILY AS NEEDED FOR  SWELLING 03/22/23   Antoine Poche, MD  hydrALAZINE (APRESOLINE) 25 MG tablet Take 1 tablet (25 mg total) by mouth in the morning and at bedtime. 04/24/23   Vassie Loll, MD  insulin  aspart (NOVOLOG) 100 UNIT/ML injection Use with Omnipod for daily dose around 100 units daily Patient taking differently: Inject 0-100 Units into the skin 3 days. Use with Omnipod 10/18/22   Dani Gobble, NP  Insulin Disposable Pump (OMNIPOD 5 [id number] PODS GEN 5) MISC Change pod every 48-72 hours. 11/11/22   Dani Gobble, NP  isosorbide mononitrate (IMDUR) 60 MG 24 hr tablet TAKE 2 TABLETS BY MOUTH ONCE DAILY. 02/21/23   Billie Lade, MD  levofloxacin (LEVAQUIN) 500 MG tablet Take 1 tablet (500 mg total) by mouth every other day for 3 doses. 04/24/23 04/29/23  Vassie Loll, MD  levothyroxine (SYNTHROID) 125 MCG tablet Take 1 tablet (125 mcg total) by mouth daily at 6 (six) AM. 06/17/22 06/12/23  Dani Gobble, NP  metoprolol succinate (TOPROL-XL) 50 MG 24 hr tablet TAKE 1 TABLET BY MOUTH ONCE DAILY . APPOINTMENT REQUIRED FOR FUTURE REFILLS 04/25/23   Antoine Poche, MD  Multiple Vitamin (MULTIVITAMIN WITH MINERALS) TABS tablet Take 1 tablet by mouth daily. 02/18/22   Love, Evlyn Kanner, PA-C  nitroGLYCERIN (NITROSTAT) 0.4 MG SL tablet Place 1 tablet (0.4 mg total) under the tongue every 5 (five) minutes x 3 doses as needed for chest pain. 02/18/22   Love, Evlyn Kanner, PA-C  oxyCODONE-acetaminophen (PERCOCET) 7.5-325 MG tablet Take 1 tablet by mouth daily as needed for severe pain. 02/08/23   Fanny Dance, MD  pantoprazole (PROTONIX) 40  MG tablet Take 1 tablet (40 mg total) by mouth daily. Courtesy fill/ pt to get established with a primary MD for further refills Patient taking differently: Take 40 mg by mouth daily as needed (reflux). 06/01/22 04/21/23  Billie Lade, MD  ranolazine (RANEXA) 1000 MG SR tablet TAKE 1  BY MOUTH TWICE DAILY 04/11/23   Billie Lade, MD  senna-docusate (SENOKOT-S) 8.6-50 MG tablet Take 1-2 tablets by mouth daily as needed for mild constipation. 08/11/22   Gardenia Phlegm, MD  tiZANidine (ZANAFLEX) 4 MG tablet Take 1 tablet (4 mg total) by mouth at  bedtime. 01/20/23   Billie Lade, MD  topiramate (TOPAMAX) 50 MG tablet Take 1 tablet (50 mg total) by mouth 2 (two) times daily. 03/16/23   Shon Hale, MD  traZODone (DESYREL) 100 MG tablet Take 100 mg by mouth at bedtime. 04/05/23   [provider]  warfarin (COUMADIN) 5 MG tablet TAKE ONE TABLET BY MOUTH DAILY EXCEPT 1/2 TABLET ON WEDNESDAYS AND SATURDAYS OR AS DIRECTED Patient taking differently: Take 2.5-5 mg by mouth daily. Take 5 mg daily and 2.5 mg on Wednesdays and Saturdays 02/14/23   Antoine Poche, MD      Allergies    Visipaque [iodixanol], Wellbutrin [bupropion], Gabapentin, Cefepime, Ciprofloxacin hcl, and Tape    Review of Systems   Review of Systems  Unable to perform ROS: Mental status change  Neurological:  Positive for weakness.    Physical Exam Updated Vital Signs BP (!) 88/74   Pulse (!) 55   Temp (!) 97.5 F (36.4 C) (Oral)   Resp (!) 27   Ht 1.651 m (5\' 5" )   Wt 97.2 kg   LMP 04/24/2023 (Exact Date)   SpO2 100%   BMI 35.66 kg/m  Physical Exam Vitals and nursing note reviewed.  Constitutional:      Appearance: Normal appearance. She is well-developed.  HENT:     Head: Atraumatic.     Nose: Nose normal.     Mouth/Throat:     Mouth: Mucous membranes are dry.  Eyes:     General: No scleral icterus.    Conjunctiva/sclera: Conjunctivae normal.     Pupils: Pupils are equal, round, and reactive to light.  Neck:     Vascular: No carotid bruit.     Trachea: No tracheal deviation.     Comments: No stiffness or rigidity.  Cardiovascular:     Rate and Rhythm: Normal rate and regular rhythm.     Pulses: Normal pulses.     Heart sounds: Normal heart sounds. No murmur heard.    No friction rub. No gallop.  Pulmonary:     Effort: Pulmonary effort is normal. No respiratory distress.     Breath sounds: Normal breath sounds.  Abdominal:     General: Bowel sounds are normal. There is no distension.     Palpations: Abdomen is soft.      Tenderness: There is no abdominal tenderness. There is no guarding.  Genitourinary:    Comments: No cva tenderness.  Musculoskeletal:        General: No swelling or tenderness.     Cervical back: Normal range of motion and neck supple. No rigidity. No muscular tenderness.     Comments: LUE in sling with bruising (old) to left shoulder.   Skin:    General: Skin is warm and dry.     Findings: No rash.  Neurological:     Mental Status: She is alert.  Comments: Alert, makes words/speech but appears confused/altered. Moves bilateral extremities purposefully.      ED Results / Procedures / Treatments   Labs (all labs ordered are listed, but only abnormal results are displayed) Results for orders placed or performed during the hospital encounter of 04/29/23  CBG monitoring, ED   Collection Time: 04/29/23  9:34 AM  Result Value Ref Range   Glucose-Capillary >600 (HH) 70 - 99 mg/dL  Lactic acid, plasma   Collection Time: 04/29/23  9:37 AM  Result Value Ref Range   Lactic Acid, Venous 3.1 (HH) 0.5 - 1.9 mmol/L  Resp panel by RT-PCR (RSV, Flu A&B, Covid) Anterior Nasal Swab   Collection Time: 04/29/23  9:59 AM   Specimen: Anterior Nasal Swab  Result Value Ref Range   SARS Coronavirus 2 by RT PCR NEGATIVE NEGATIVE   Influenza A by PCR NEGATIVE NEGATIVE   Influenza B by PCR NEGATIVE NEGATIVE   Resp Syncytial Virus by PCR NEGATIVE NEGATIVE  Beta-hydroxybutyric acid   Collection Time: 04/29/23 11:23 AM  Result Value Ref Range   Beta-Hydroxybutyric Acid >8.00 (H) 0.05 - 0.27 mmol/L  Comprehensive metabolic panel   Collection Time: 04/29/23 11:28 AM  Result Value Ref Range   Sodium 124 (L) 135 - 145 mmol/L   Potassium 6.0 (H) 3.5 - 5.1 mmol/L   Chloride 94 (L) 98 - 111 mmol/L   CO2 <7 (L) 22 - 32 mmol/L   Glucose, Bld 832 (HH) 70 - 99 mg/dL   BUN 46 (H) 6 - 20 mg/dL   Creatinine, Ser 1.61 (H) 0.44 - 1.00 mg/dL   Calcium 7.2 (L) 8.9 - 10.3 mg/dL   Total Protein 4.8 (L) 6.5 - 8.1  g/dL   Albumin 2.0 (L) 3.5 - 5.0 g/dL   AST 25 15 - 41 U/L   ALT 20 0 - 44 U/L   Alkaline Phosphatase 74 38 - 126 U/L   Total Bilirubin 1.6 (H) <1.2 mg/dL   GFR, Estimated 14 (L) >60 mL/min   Anion gap NOT CALCULATED 5 - 15  Troponin I (High Sensitivity)   Collection Time: 04/29/23 11:28 AM  Result Value Ref Range   Troponin I (High Sensitivity) 2,646 (HH) <18 ng/L  CBG monitoring, ED   Collection Time: 04/29/23 12:25 PM  Result Value Ref Range   Glucose-Capillary >600 (HH) 70 - 99 mg/dL   DG Chest Port 1 View Result Date: 04/29/2023 CLINICAL DATA:  Weakness. EXAM: PORTABLE CHEST 1 VIEW COMPARISON:  Chest radiograph dated April 21, 2023. FINDINGS: Low lung volumes with accentuation of the cardiomediastinal silhouette. No focal consolidation. The left costophrenic angle is not completely included within the field of view. No sizable pleural effusion or pneumothorax identified. No acute osseous abnormality. IMPRESSION: Low lung volume.  No acute cardiopulmonary findings. Electronically Signed   By: Hart Robinsons M.D.   On: 04/29/2023 10:34   DG Shoulder Left Result Date: 04/25/2023 CLINICAL DATA:  Pain after fall EXAM: LEFT SHOULDER - 2 VIEW; LEFT WRIST - COMPLETE 4 VIEW; LEFT ELBOW - 2 VIEW COMPARISON:  Left thumb radiographs October 11, 2022 FINDINGS: There is a comminuted and minimally displaced fracture of the surgical neck of the left humerus. There is no definite intra-articular extension. There is moderate overlying soft tissue swelling. No definite additional acute fracture is visualized. However, there is limited assessment for elbow joint effusion due to limitations in patient positioning. Partially visualized healed fractures of the proximal and distal phalanxes of the first digit. No dislocation.  The joint spaces are well-maintained. Diffuse vascular calcifications. IMPRESSION: 1. Comminuted and minimally displaced left humeral neck fracture. 2. No definite additional acute  fracture is visualized in the left elbow or wrist. Electronically Signed   By: Jacob Moores M.D.   On: 04/25/2023 10:14   DG Wrist Complete Left Result Date: 04/25/2023 CLINICAL DATA:  Pain after fall EXAM: LEFT SHOULDER - 2 VIEW; LEFT WRIST - COMPLETE 4 VIEW; LEFT ELBOW - 2 VIEW COMPARISON:  Left thumb radiographs October 11, 2022 FINDINGS: There is a comminuted and minimally displaced fracture of the surgical neck of the left humerus. There is no definite intra-articular extension. There is moderate overlying soft tissue swelling. No definite additional acute fracture is visualized. However, there is limited assessment for elbow joint effusion due to limitations in patient positioning. Partially visualized healed fractures of the proximal and distal phalanxes of the first digit. No dislocation.  The joint spaces are well-maintained. Diffuse vascular calcifications. IMPRESSION: 1. Comminuted and minimally displaced left humeral neck fracture. 2. No definite additional acute fracture is visualized in the left elbow or wrist. Electronically Signed   By: Jacob Moores M.D.   On: 04/25/2023 10:14   DG Elbow 2 Views Left Result Date: 04/25/2023 CLINICAL DATA:  Pain after fall EXAM: LEFT SHOULDER - 2 VIEW; LEFT WRIST - COMPLETE 4 VIEW; LEFT ELBOW - 2 VIEW COMPARISON:  Left thumb radiographs October 11, 2022 FINDINGS: There is a comminuted and minimally displaced fracture of the surgical neck of the left humerus. There is no definite intra-articular extension. There is moderate overlying soft tissue swelling. No definite additional acute fracture is visualized. However, there is limited assessment for elbow joint effusion due to limitations in patient positioning. Partially visualized healed fractures of the proximal and distal phalanxes of the first digit. No dislocation.  The joint spaces are well-maintained. Diffuse vascular calcifications. IMPRESSION: 1. Comminuted and minimally displaced left humeral neck  fracture. 2. No definite additional acute fracture is visualized in the left elbow or wrist. Electronically Signed   By: Jacob Moores M.D.   On: 04/25/2023 10:14   US Venous Img Upper Uni Left (DVT) Result Date: 04/24/2023 CLINICAL DATA:  [id number] Left arm swelling [id number] EXAM: LEFT UPPER EXTREMITY VENOUS DOPPLER ULTRASOUND TECHNIQUE: Gray-scale sonography with graded compression, as well as color Doppler and duplex ultrasound were performed to evaluate the upper extremity deep venous system from the level of the subclavian vein and including the jugular, axillary, basilic, radial, ulnar and upper cephalic vein. Spectral Doppler was utilized to evaluate flow at rest and with distal augmentation maneuvers. COMPARISON:  None Available. FINDINGS: Contralateral Subclavian Vein: Respiratory phasicity is normal and symmetric with the symptomatic side. No evidence of thrombus. Normal compressibility. Internal Jugular Vein: No evidence of thrombus. Normal compressibility, respiratory phasicity and response to augmentation. Subclavian Vein: No evidence of thrombus. Normal compressibility, respiratory phasicity and response to augmentation. Axillary Vein: No evidence of thrombus. Normal compressibility, respiratory phasicity and response to augmentation. Cephalic Vein: No evidence of thrombus. Normal compressibility, respiratory phasicity and response to augmentation. Basilic Vein: No evidence of thrombus. Normal compressibility, respiratory phasicity and response to augmentation. Brachial Veins: No evidence of thrombus. Normal compressibility, respiratory phasicity and response to augmentation. Radial Veins: No evidence of thrombus. Normal compressibility, respiratory phasicity and response to augmentation. Ulnar Veins: No evidence of thrombus. Normal compressibility, respiratory phasicity and response to augmentation. Venous Reflux:  None visualized. Other Findings:  None visualized. IMPRESSION: No evidence of DVT  within the left upper  extremity. Electronically Signed   By: Delbert Phenix M.D.   On: 04/24/2023 15:02   DG Chest Port 1 View Result Date: 04/21/2023 CLINICAL DATA:  Hyperglycemia EXAM: PORTABLE CHEST 1 VIEW COMPARISON:  Chest x-ray 10/11/2022 FINDINGS: Heart is mildly enlarged, unchanged. The lungs are clear. There is no pleural effusion or pneumothorax. No acute fractures are seen. IMPRESSION: No active disease. Electronically Signed   By: Darliss Cheney M.D.   On: 04/21/2023 21:18   CT Head Wo Contrast Result Date: 04/21/2023 CLINICAL DATA:  Headache, neuro deficit. EXAM: CT HEAD WITHOUT CONTRAST TECHNIQUE: Contiguous axial images were obtained from the base of the skull through the vertex without intravenous contrast. RADIATION DOSE REDUCTION: This exam was performed according to the departmental dose-optimization program which includes automated exposure control, adjustment of the mA and/or kV according to patient size and/or use of iterative reconstruction technique. COMPARISON:  Head CT 03/13/2023 and MRI 03/05/2023 FINDINGS: Brain: There is no evidence of an acute infarct, intracranial hemorrhage, mass, midline shift, or extra-axial fluid collection. Chronic infarcts are again seen in the right cerebral hemisphere primarily in the MCA distribution with associated ex vacuo dilatation of the right lateral ventricle and wallerian degeneration in the cerebral peduncle. A small chronic left occipital infarct is also again noted. Vascular: Calcified atherosclerosis at the skull base. No hyperdense vessel. Skull: No acute fracture or suspicious osseous lesion. Sinuses/Orbits: Visualized paranasal sinuses and mastoid air cells are clear. Unremarkable orbits. Other: None. IMPRESSION: 1. No evidence of acute intracranial abnormality. 2. 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 Electronically Signed   By: Sebastian Ache M.D.   On: 04/21/2023 19:19     EKG EKG  Interpretation Date/Time:  Friday April 29 2023 09:25:04 EST Ventricular Rate:  73 PR Interval:  166 QRS Duration:  122 QT Interval:  457 QTC Calculation: 504 R Axis:   83  Text Interpretation: Sinus rhythm Nonspecific intraventricular conduction delay Non-specific ST-t changes `st changes inferiorly and v3-v4 similar to ecg 12/19. Baseline wander Confirmed by Cathren Laine ([id number]) on 04/29/2023 9:45:27 AM  Radiology DG Chest Port 1 View Result Date: 04/29/2023 CLINICAL DATA:  Weakness. EXAM: PORTABLE CHEST 1 VIEW COMPARISON:  Chest radiograph dated April 21, 2023. FINDINGS: Low lung volumes with accentuation of the cardiomediastinal silhouette. No focal consolidation. The left costophrenic angle is not completely included within the field of view. No sizable pleural effusion or pneumothorax identified. No acute osseous abnormality. IMPRESSION: Low lung volume.  No acute cardiopulmonary findings. Electronically Signed   By: Hart Robinsons M.D.   On: 04/29/2023 10:34    Procedures Central Line  Date/Time: 04/29/2023 1:39 PM  Performed by: Cathren Laine, MD Authorized by: Cathren Laine, MD   Consent:    Consent obtained:  Emergent situation   Consent given by:  Patient, healthcare agent and spouse Pre-procedure details:    Indication(s): central venous access     Hand hygiene: Hand hygiene performed prior to insertion     Skin preparation:  Chlorhexidine with alcohol   Skin preparation agent: Skin preparation agent completely dried prior to procedure   Anesthesia:    Anesthesia method:  Local infiltration   Local anesthetic:  Lidocaine 2% WITH epi Procedure details:    Location:  L femoral   Site selection rationale:  Pt/family does not want intubation/vent, and is encephalopathic, moving all about, not able to cooperative or maintain stillenss for neck line. emergent nature of situation, access.   Patient position:  Supine  Procedural supplies:  Triple lumen   Number  of attempts:  1   Successful placement: yes   Post-procedure details:    Post-procedure:  Dressing applied and line sutured   Assessment:  Blood return through all ports (dark non-pulsatile blood return from all ports, flushed.)   Procedure completion:  Tolerated well, no immediate complications Comments:     Sterile dressing     Medications Ordered in ED Medications  lactated ringers infusion ( Intravenous Not Given 04/29/23 1312)  vancomycin (VANCOCIN) IVPB 1000 mg/200 mL premix (1,000 mg Intravenous New Bag/Given 04/29/23 1311)  insulin regular, human (MYXREDLIN) 100 units/ 100 mL infusion (8 Units/hr Intravenous New Bag/Given 04/29/23 1254)  lactated ringers infusion ( Intravenous New Bag/Given 04/29/23 1243)  dextrose 5 % in lactated ringers infusion (has no administration in time range)  dextrose 50 % solution 0-50 mL (has no administration in time range)  lactated ringers bolus 1,944 mL (has no administration in time range)  lactated ringers bolus 1,000 mL (0 mLs Intravenous Stopped 04/29/23 1151)    And  lactated ringers bolus 1,000 mL (0 mLs Intravenous Stopped 04/29/23 1244)    And  lactated ringers bolus 1,000 mL (1,000 mLs Intravenous New Bag/Given 04/29/23 1152)  aztreonam (AZACTAM) 2 g in sodium chloride 0.9 % 100 mL IVPB (0 g Intravenous Stopped 04/29/23 1205)  metroNIDAZOLE (FLAGYL) IVPB 500 mg (0 mg Intravenous Stopped 04/29/23 1313)    ED Course/ Medical Decision Making/ A&P                                 Medical Decision Making Problems Addressed: Abnormal ECG: acute illness or injury ACS (acute coronary syndrome) [facility]): acute illness or injury with systemic symptoms that poses a threat to life or bodily functions AKI (acute kidney injury) (HCC): acute illness or injury with systemic symptoms that poses a threat to life or bodily functions Bradycardia: acute illness or injury with systemic symptoms that poses a threat to life or bodily functions Dehydration:  acute illness or injury with systemic symptoms that poses a threat to life or bodily functions 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 [facility]): acute illness or injury with systemic symptoms that poses a threat to life or bodily functions Elevated troponin: acute illness or injury Generalized weakness: acute illness or injury with systemic symptoms that poses a threat to life or bodily functions Hypotension due to hypovolemia: acute illness or injury with systemic symptoms that poses a threat to life or bodily functions Stage 4 chronic kidney disease (HCC): chronic illness or injury with exacerbation, progression, or side effects of treatment that poses a threat to life or bodily functions  Amount and/or Complexity of Data Reviewed Independent Historian: spouse and EMS    Details: hx External Data Reviewed: labs and notes. Labs: ordered. Decision-making details documented in ED Course. Radiology: ordered and independent interpretation performed. Decision-making details documented in ED Course. Discussion of management or test interpretation with external provider(s): Cardiology, icu  Risk Prescription drug management. Decision regarding hospitalization.   Iv ns. Continuous pulse ox and cardiac monitoring. Labs ordered/sent. Imaging ordered.   Differential diagnosis includes dka, sepsis, etc. Dispo decision including potential need for admission considered - will get labs and imaging and reassess.   Reviewed nursing notes and prior charts for additional history. External reports reviewed. Additional history from: EMS.  Todays ecg is abnormal, but similar to ecg 12/19 when  pt presented with dka and altered mental status, and low blood pressure.   Cardiac monitor: sinus rhythm, rate 74.  LR bolus. Cultures sent. Iv abx given.   Labs reviewed/interpreted by me - glucose very high, hc03 low.   Insulin gtt per hyperglycemic crisis orders.    Xrays reviewed/interpreted by me - no pna  Recent admit note references 'DNR'. I discussed with patient, as feel if was full intervention/full code, may need intubation.  Patient indicates does not want to be intubated, or on vent, or have cpr.  Spouse now in room, confirms that patient wishes were for no intubation/no vent, no cpr. Pt/family indicate ok with ivf, iv meds, and other scope of care.   Cardiology consulted re grossly abn ecg and high trop (although ecg similar to prior ecg this month). Pt with hx severe multi-vessel cad. Discussed w Dr Tenny Craw who reviewed labs and ecg - she agrees with admit to [facility] ICU, they will see and decide on whether further cardiac intervention warranted, indicates would add heparin iv.   Additional fluids/labs remain pending.   Patient with two small peripheral ivs, but is critical ill and needs additional access. Spouse/family indicate ok for cvl. As pt moving about, including head and neck area, and not able to sedate/intubate, will place left femoral line. Sterile prep and drape. Placed on initial attempt. Sterile dressing.   Discussed pt with Dr Delton Coombes, accepts in transfer to [facility] icu.   [facility] notified.   CRITICAL CARE RE: severe dka with encephalopathy, multiorgan failure, acute coronary ischemia, elevated trop, aki on ckd, hypotension. Bradycardia.  Performed by: Suzi Roots Total critical care time: 150 minutes Critical care time was exclusive of separately billable procedures and treating other patients. Critical care was necessary to treat or prevent imminent or life-threatening deterioration. Critical care was time spent personally by me on the following activities: development of treatment plan with patient and/or surrogate as well as nursing, discussions with consultants, evaluation of patient's response to treatment, examination of patient, obtaining history from patient or surrogate, ordering and performing treatments and interventions,  ordering and review of laboratory studies, ordering and review of radiographic studies, pulse oximetry and re-evaluation of patient's condition.          Final Clinical Impression(s) / ED Diagnoses Final diagnoses:  None    Rx / DC Orders ED Discharge Orders     None         Cathren Laine, MD 04/29/23 1351    Cathren Laine, MD 05/03/23 [id number]

## 2023-04-29 NOTE — Consult Note (Addendum)
Cardiology Consultation   Patient ID: Lauren [name] MRN: [id number]; DOB: [birth year]  Admit date: 04/29/2023 Date of Consult: 04/29/2023  PCP:  Billie Lade, MD   [facility] Providers Cardiologist:  Dina Rich, MD     Patient Profile:   Lauren [name] is a 42 y.o. female with a hx of ischemic cardiomyopathy, chronic systolic heart failure, hypertension, multivessel CAD, history of CVA s/p mechanical thrombectomy complicated by right ICA dissection, stage IV CKD, type 2 diabetes, hyperlipidemia, history of critical limb ischemia of left lower extremity s/p left anterior tibial/dorsalis pedis angioplasty and left mid SFA stent in 06/2022, right brachial/radial/ulnar ischemia requiring thrombectomy 10/2021 who is being seen 04/29/2023 for the evaluation of elevated troponins at the request of Dr. Delton Coombes.  History of Present Illness:   Lauren [name] is a 42 year old female with above medical history who has been followed by Dr. Wyline Mood as an outpatient.  Her first cardiac catheterization was completed 03/2014 and showed EF greater than 60%, 30% mid LAD, 50-60% distal D1 disease.  Left circumflex patent, moderate to severe distal OM 2 disease.  There was diffuse disease in the RCA.  Overall, no targets for PCI.  Patient later underwent stress test in 02/2020 that showed a large periapical defect consistent with scar, mild apical anterolateral ischemia.  Later, echocardiogram in 05/2020 showed EF 35-40% with regional wall motion abnormalities, no sign of apical thrombus, normal RV systolic function, mild MR.  EF later dropped to 30-35% in 01/2022.  Echocardiogram 01/2022 also revealed regional wall motion abnormalities, significant thinning of the mid to apical septal segments and apex likely representing scarring/infarct, no LV thrombus, normal RV function.  Due to her renal disease, repeat heart catheterization was deferred.  Also in 01/2022, patient suffered an acute right MCA  infarct and punctate left MCA/ACA infarct with right M1 occlusion requiring mechanical thrombectomy with TICI 3 revascularization.  This was complicated by right ICA dissection which required telescope stent placement.  Stroke etiology was felt to be cryptogenic, large vessel disease versus cardioembolic.  Her hypercoagulable workup revealed abnormal lupus anticoagulate and increased rheumatoid factor.  However, this was felt to be due to her IV heparin use and it was recommended that labs be repeated in 3 months.  She was ultimately transitioned to Lovenox and Brilinta by neurology at the time of discharge.  She later saw hematology in 04/2022, deemed to have no obvious hypercoagulable disorder.  Transition from Lovenox to Coumadin with INR goal 2-3.    Patient admitted in 07/2022 with NSTEMI in the setting of diabetic foot osteomyelitis requiring left fifth metatarsal excision.  Echocardiogram 07/24/2022 showed EF 30-35% with regional wall motion abnormalities, moderate LVH, mildly reduced RV function, mildly elevated PA systolic pressure, mild-moderate MR.  She underwent cardiac catheterization on 07/26/2022 that showed two-vessel occlusive CAD.  The LAD occluded after small diagonal.  The RCA was a small vessel that was diffusely diseased, totally occluded distally.  There were no suitable targets for vascularization and patient was managed medically.  She was treated with Plavix, Coumadin, isosorbide, Ranexa, metoprolol.  Patient shortly after return to the hospital in 08/2022 with recurrent chest pain.  Had troponin elevation at 300-400.  Treated with IV heparin and nitroglycerin infusion.  Felt that medical therapy was her only option and her Imdur was increased.  Patient was again admitted in 11/2022 with chest pain, NSTEMI.  High-sensitivity troponin 923, 990.  EKG was without acute changes.  It was suspected that her NSTEMI was due  to demand with her fixed multivessel obstructions (patient has known  multivessel coronary artery disease without targets for vascularization).  Felt that risk of renal injury with coronary angiogram outweighed the benefits and patient was managed medically.  Imdur was increased to 120 mg daily.  Patient remained on Plavix, Toprol-XL, Ranexa, as needed Lasix, Coumadin.  In the past 2 months, patient has been admitted to the hospital/seen in the ED on 4 different occasions.  Seen in the ED on 03/05/23 for hallucinations.  Patient was IVC need and admitted for psychiatric evaluation.  Patient later seen in the ED on 11/10 with chief complaint of seizure-like activity.  She was admitted and seen by neurology who increased her Topamax to 50 mg twice daily and recommended clonazepam 1 mg as needed for seizures lasting more than 2 minutes.  Patient was admitted on 04/21/2023 with DKA.  Patient had presented with altered mental status after her insulin pump broke the night before.  CT head without contrast showed no evidence of acute intracranial abnormality.  Treated with insulin drip per Endo.  She was discharged on 12/22.  She was seen in the ED on 12/23 after she had a fall.  She was found to have a proximal humerus fracture and was immobilized with splint.  Recommended follow-up with orthopedics  Patient presented to the ED again on 12/27 complaining of generalized weakness.  Upon arrival to the ED, it was noted that patient had low blood pressure and CBG greater than 600.  Patient was a limited historian.  Initial vital signs in the ED showed BP 69/34, pulse 72 bpm, oxygen 99% on room air.  Labs significant for lactic acid 3.1, glucose 832, potassium 6, BUN 46, creatinine 3.95.  High-sensitivity troponin 2646.  EKG showed normal sinus rhythm with nonspecific IVCD, inverted T waves in lead aVL, minimal (less than 1 box) ST elevation in the 3, V4.  Overall, EKG appears to be unchanged compared to EKG from 12/19.  Past Medical History:  Diagnosis Date   Anemia    CAD (coronary  artery disease)    a. s/p cath in 03/2014 showing 30% mid-LAD, moderate to severe disease along small D1, patent LCx, moderate to severe distal OM2 stenosis and moderate diffuse diease along RCA not amenable to PCI   CHF (congestive heart failure) (HCC)    a. EF 55-60% in 12/2019 b. EF at 35-40% by echo in 05/2020   CKD (chronic kidney disease) stage 4, GFR 15-29 ml/min (HCC)    Diabetes mellitus without complication (HCC)    Myocardial infarction (HCC)    Neuropathy    PVD (peripheral vascular disease) (HCC)    Stroke (HCC) 01/2022   L hand weakness    Past Surgical History:  Procedure Laterality Date   ABDOMINAL AORTOGRAM W/LOWER EXTREMITY N/A 06/10/2022   Procedure: ABDOMINAL AORTOGRAM W/LOWER EXTREMITY;  Surgeon: Cephus Shelling, MD;  Location: [facility] INVASIVE CV [facility];  Service: Cardiovascular;  Laterality: N/A;   AMPUTATION Left 09/02/2021   Procedure: AMPUTATION RAY;  Surgeon: Edwin Cap, DPM;  Location: [facility] OR;  Service: Podiatry;  Laterality: Left;  sagittal saw, 3L bag saline & Pulse   Cardiac catherization     CHOLECYSTECTOMY     I & D EXTREMITY Left 08/30/2021   Procedure: IRRIGATION AND DEBRIDEMENT WITH BONE BIOPSY;  Surgeon: Candelaria Stagers, DPM;  Location: [facility] OR;  Service: Podiatry;  Laterality: Left;   IR ANGIO VERTEBRAL SEL SUBCLAVIAN INNOMINATE UNI L MOD SED  01/18/2022  IR CT HEAD LTD  01/08/2022   IR INTRA CRAN STENT  01/08/2022   IR PERCUTANEOUS ART THROMBECTOMY/INFUSION INTRACRANIAL INC DIAG ANGIO  01/08/2022   IRRIGATION AND DEBRIDEMENT FOOT Left 09/04/2021   Procedure: IRRIGATION AND DEBRIDEMENT FOOT AND CLOSURE;  Surgeon: Edwin Cap, DPM;  Location: [facility] OR;  Service: Podiatry;  Laterality: Left;   LEFT HEART CATH AND CORONARY ANGIOGRAPHY N/A 07/26/2022   Procedure: LEFT HEART CATH AND CORONARY ANGIOGRAPHY;  Surgeon: [place], Peter M, MD;  Location: [facility] INVASIVE CV LAB;  Service: Cardiovascular;  Laterality: N/A;   METATARSAL OSTEOTOMY Left 07/21/2022   Procedure: LEFT  FOOT EXCISION OF FIFTH METATARSAL WITH DEBRIDEMENT OF ULCER, POSSIBLE BONE BIOPSY OF THE FOURTH METATARSAL, POSSIBLE FOURTH RAY AMPUTATION OF LEFT TOEAND METATARSAL;  Surgeon: Vivi Barrack, DPM;  Location: [facility] OR;  Service: Podiatry;  Laterality: Left;   PERIPHERAL VASCULAR BALLOON ANGIOPLASTY Left 06/10/2022   Procedure: PERIPHERAL VASCULAR BALLOON ANGIOPLASTY;  Surgeon: Cephus Shelling, MD;  Location: [facility];  Service: Cardiovascular;  Laterality: Left;  AT and DP   PERIPHERAL VASCULAR INTERVENTION Left 06/10/2022   Procedure: PERIPHERAL VASCULAR INTERVENTION;  Surgeon: Cephus Shelling, MD;  Location: [facility] INVASIVE CV LAB;  Service: Cardiovascular;  Laterality: Left;  SFA   RADIOLOGY WITH ANESTHESIA N/A 01/08/2022   Procedure: IR WITH ANESTHESIA;  Surgeon: Radiologist, Medication, MD;  Location: [facility] OR;  Service: Radiology;  Laterality: N/A;   THROMBECTOMY BRACHIAL ARTERY Right 11/18/2021   Procedure: RIGHT BRACHIAL, RADIAL, & ULNAR ARTERY THROMBECTOMY.;  Surgeon: Cephus Shelling, MD;  Location: [facility];  Service: Vascular;  Laterality: Right;   TUBAL LIGATION       Home Medications:  Prior to Admission medications   Medication Sig Start Date End Date Taking? Authorizing Provider  acetaminophen (TYLENOL) 325 MG tablet Take 1-2 tablets (325-650 mg total) by mouth every 4 (four) hours as needed for mild pain. Patient taking differently: Take 650 mg by mouth as needed for mild pain (pain score 1-3). 02/18/22  Yes Love, Evlyn Kanner, PA-C  atorvastatin (LIPITOR) 40 MG tablet TAKE 1 TABLET BY MOUTH ONCE DAILY. 01/25/23  Yes Billie Lade, MD  clonazePAM (KLONOPIN) 1 MG disintegrating tablet Take 1 tablet (1 mg total) by mouth 2 (two) times daily as needed for seizure (For seizure lasting more than 2 minutes.). 03/16/23  Yes Emokpae, Courage, MD  clopidogrel (PLAVIX) 75 MG tablet Take 1 tablet (75 mg total) by mouth daily. 04/29/22  Yes Micki Riley, MD  furosemide (LASIX) 40 MG  tablet TAKE 1 TABLET BY MOUTH ONCE DAILY AS NEEDED FOR  SWELLING 03/22/23  Yes Branch, Dorothe Pea, MD  hydrALAZINE (APRESOLINE) 25 MG tablet Take 1 tablet (25 mg total) by mouth in the morning and at bedtime. 04/24/23  Yes Vassie Loll, MD  ibuprofen (ADVIL) 200 MG tablet Take 800 mg by mouth every 6 (six) hours as needed for moderate pain (pain score 4-6).   Yes [provider]  insulin aspart (NOVOLOG) 100 UNIT/ML injection Use with Omnipod for daily dose around 100 units daily Patient taking differently: Inject 0-200 Units into the skin See admin instructions. Load Omnipod with 200 units every 3-4 days. 10/18/22  Yes Reardon, Freddi Starr, NP  isosorbide mononitrate (IMDUR) 60 MG 24 hr tablet TAKE 2 TABLETS BY MOUTH ONCE DAILY. 02/21/23  Yes Billie Lade, MD  levofloxacin (LEVAQUIN) 500 MG tablet Take 1 tablet (500 mg total) by mouth every other day for 3 doses. 04/24/23 04/29/23  Yes Vassie Loll, MD  levothyroxine (SYNTHROID) 125 MCG tablet Take 1 tablet (125 mcg total) by mouth daily at 6 (six) AM. 06/17/22 06/12/23 Yes Reardon, Freddi Starr, NP  metoprolol succinate (TOPROL-XL) 50 MG 24 hr tablet TAKE 1 TABLET BY MOUTH ONCE DAILY . APPOINTMENT REQUIRED FOR FUTURE REFILLS 04/25/23  Yes Branch, Dorothe Pea, MD  Multiple Vitamin (MULTIVITAMIN WITH MINERALS) TABS tablet Take 1 tablet by mouth daily. 02/18/22  Yes Love, Evlyn Kanner, PA-C  nitroGLYCERIN (NITROSTAT) 0.4 MG SL tablet Place 1 tablet (0.4 mg total) under the tongue every 5 (five) minutes x 3 doses as needed for chest pain. 02/18/22  Yes Love, Evlyn Kanner, PA-C  oxyCODONE-acetaminophen (PERCOCET) 7.5-325 MG tablet Take 1 tablet by mouth daily as needed for severe pain. 02/08/23  Yes Fanny Dance, MD  pantoprazole (PROTONIX) 40 MG tablet Take 1 tablet (40 mg total) by mouth daily. Courtesy fill/ pt to get established with a primary MD for further refills Patient taking differently: Take 40 mg by mouth daily as needed (reflux). 06/01/22  04/29/23 Yes Billie Lade, MD  ranolazine (RANEXA) 1000 MG SR tablet TAKE 1  BY MOUTH TWICE DAILY 04/11/23  Yes Billie Lade, MD  senna-docusate (SENOKOT-S) 8.6-50 MG tablet Take 1-2 tablets by mouth daily as needed for mild constipation. 08/11/22  Yes Gardenia Phlegm, MD  tiZANidine (ZANAFLEX) 4 MG tablet Take 1 tablet (4 mg total) by mouth at bedtime. 01/20/23  Yes Billie Lade, MD  topiramate (TOPAMAX) 50 MG tablet Take 1 tablet (50 mg total) by mouth 2 (two) times daily. 03/16/23  Yes Emokpae, Courage, MD  traZODone (DESYREL) 100 MG tablet Take 100 mg by mouth at bedtime. 04/05/23  Yes [provider]  warfarin (COUMADIN) 5 MG tablet TAKE ONE TABLET BY MOUTH DAILY EXCEPT 1/2 TABLET ON WEDNESDAYS AND SATURDAYS OR AS DIRECTED Patient taking differently: Take 2.5-5 mg by mouth daily. Take 5mg  (1 tablet) daily, except on Tuesday, Thursday, and Saturday's take 2.5mg  (1/2 tablet). 02/14/23  Yes Branch, Dorothe Pea, MD  Continuous Glucose Sensor (DEXCOM G6 SENSOR) MISC CHANGE SENSOR EVERY 10 DAYS. 03/14/23   Dani Gobble, NP  Continuous Glucose Transmitter (DEXCOM G6 TRANSMITTER) MISC Change transmitter every 90 days as directed. 01/20/23   Roma Kayser, MD  Insulin Disposable Pump (OMNIPOD 5 DEXG7G6 PODS GEN 5) MISC Change pod every 48-72 hours. 11/11/22   Dani Gobble, NP    Inpatient Medications: Scheduled Meds:  Continuous Infusions:  dextrose 5% lactated ringers     insulin 8 Units/hr (04/29/23 1254)   lactated ringers     lactated ringers     lactated ringers 125 mL/hr at 04/29/23 1243   norepinephrine (LEVOPHED) Adult infusion 2 mcg/min (04/29/23 1423)   PRN Meds: dextrose  Allergies:    Allergies  Allergen Reactions   Visipaque [Iodixanol] Nausea And Vomiting    Patient had vagal response during procedure became hypertensive, and bradycardic. CO2 used during procedure prior to contrast being used, this may be cause as well. Recommended  premedicating prior to contrast being used in the future.    Wellbutrin [Bupropion] Hives   Gabapentin     dizzy   Cefepime Rash    Tolerates penicilllin   Ciprofloxacin Hcl Hives and Rash    Hives/rash at injection site; 01/15/22 tolerated IV cipro   Tape Rash    Social History:   Social History   Socioeconomic History   Marital status: Married    Spouse name: Harvel Quale  Number of children: 2   Years of education: 12   Highest education level: 12th grade  Occupational History    Comment: Full time   Occupation: CNA at [employer]    Comment: applying for disability  Tobacco Use   Smoking status: Former    Current packs/day: 0.00    Average packs/day: 1 pack/day for 15.0 years (15.0 ttl pk-yrs)    Types: Cigarettes    Start date: 01/2007    Quit date: 01/2022    Years since quitting: 1.3    Passive exposure: Past   Smokeless tobacco: Never  Vaping Use   Vaping status: Never Used  Substance and Sexual Activity   Alcohol use: Yes    Comment: occ   Drug use: No   Sexual activity: Yes    Birth control/protection: Surgical    Comment: tubal   Other Topics Concern   Not on file  Social History Narrative   Lives with husband and kids   Right handed   Drinks 9+ cups caffeine daily   Social Drivers of Health   Financial Resource Strain: Low Risk  (11/09/2022)   Overall Financial Resource Strain (CARDIA)    Difficulty of Paying Living Expenses: Not very hard  Food Insecurity: Patient Unable To Answer (04/21/2023)   Hunger Vital Sign    Worried About Running Out of Food in the Last Year: Patient unable to answer    Ran Out of Food in the Last Year: Patient unable to answer  Transportation Needs: Patient Unable To Answer (04/21/2023)   PRAPARE - Transportation    Lack of Transportation (Medical): Patient unable to answer    Lack of Transportation (Non-Medical): Patient unable to answer  Physical Activity: Inactive (03/03/2022)   Exercise Vital Sign    Days of Exercise  per Week: 0 days    Minutes of Exercise per Session: 0 min  Stress: No Stress Concern Present (03/03/2022)   [employer] of Occupational Health - Occupational Stress Questionnaire    Feeling of Stress : Only a little  Recent Concern: Stress - Stress Concern Present (12/22/2021)   [employer] of Occupational Health - Occupational Stress Questionnaire    Feeling of Stress : To some extent  Social Connections: Socially Integrated (03/03/2022)   Social Connection and Isolation Panel [NHANES]    Frequency of Communication with Friends and Family: More than three times a week    Frequency of Social Gatherings with Friends and Family: More than three times a week    Attends Religious Services: More than 4 times per year    Active Member of [employer] or Organizations: Yes    Attends Engineer, structural: More than 4 times per year    Marital Status: Married  Catering manager Violence: Patient Unable To Answer (04/21/2023)   Humiliation, Afraid, Rape, and Kick questionnaire    Fear of Current or Ex-Partner: Patient unable to answer    Emotionally Abused: Patient unable to answer    Physically Abused: Patient unable to answer    Sexually Abused: Patient unable to answer    Family History:    Family History  Problem Relation Age of Onset   Thyroid disease Mother    Hypertension Mother    Hyperlipidemia Mother    CAD Mother    CVA Mother    Hyperlipidemia Father    Hypertension Father    CAD Father    Breast cancer Paternal Grandmother 4   Cancer Paternal Grandmother  lung     ROS:  Please see the history of present illness.   All other ROS reviewed and negative.     Physical Exam/Data:   Vitals:   04/29/23 1500 04/29/23 1505 04/29/23 1510 04/29/23 1515  BP: 97/62 108/60 107/67 118/66  Pulse: 89 90 92 91  Resp: 18 16 17  (!) 22  Temp:      TempSrc:      SpO2: 100% 100% 100% 100%  Weight:      Height:       No intake or output data in the 24 hours  ending 04/29/23 1611    04/29/2023    9:22 AM 04/25/2023    8:42 AM 04/22/2023    8:17 PM  Last 3 Weights  Weight (lbs) 214 lb 4.6 oz 214 lb 4.6 oz 214 lb 4.6 oz  Weight (kg) 97.2 kg 97.2 kg 97.2 kg     Body mass index is 35.66 kg/m.  General: Chronically ill appearing middle aged female. Sleepy. Mumbles responses. Does not open eyes during our discussion  HEENT: normal Neck: no JVD Cardiac:  normal S1, S2; RRR; no murmur  Lungs:  Anterior lung exam clear. Wearing non-rebreather  Abd: soft, nondistended  Ext: no edema in BLE  Skin: warm and dry  Neuro:  mumbles responses, does not open eyes during the duration of our discussion  Psych:  Normal affect   EKG:  The EKG was personally reviewed and demonstrates: EKG showed normal sinus rhythm with nonspecific IVCD, inverted T waves in lead aVL, minimal (less than 1 box) ST elevation in the 3, V4.  Overall, EKG appears to be unchanged compared to EKG from 12/19.    Telemetry:  Telemetry was personally reviewed and demonstrates:  Sinus tachycardia HR 100 BPM  Relevant CV Studies:   Laboratory Data:  High Sensitivity Troponin:   Recent Labs  Lab 04/29/23 1128  TROPONINIHS 2,646*     Chemistry Recent Labs  Lab 04/24/23 0544 04/25/23 0856 04/29/23 1128  NA 139 134* 124*  K 3.3* 3.3* 6.0*  CL 105 105 94*  CO2 25 23 <7*  GLUCOSE 238* 302* 832*  BUN 28* 21* 46*  CREATININE 2.63* 2.45* 3.95*  CALCIUM 7.9* 8.0* 7.2*  GFRNONAA 23* 25* 14*  ANIONGAP 9 6 NOT CALCULATED    Recent Labs  Lab 04/25/23 0856 04/29/23 1128  PROT 6.1* 4.8*  ALBUMIN 2.8* 2.0*  AST 34 25  ALT 34 20  ALKPHOS 80 74  BILITOT 0.5 1.6*   Lipids No results for input(s): "CHOL", "TRIG", "HDL", "LABVLDL", "LDLCALC", "CHOLHDL" in the last 168 hours.  Hematology Recent Labs  Lab 04/23/23 0541 04/25/23 0856  WBC 16.2* 8.7  RBC 3.67* 3.48*  HGB 10.9* 10.5*  HCT 33.1* 32.0*  MCV 90.2 92.0  MCH 29.7 30.2  MCHC 32.9 32.8  RDW 13.4 13.7  PLT 304  267   Thyroid No results for input(s): "TSH", "FREET4" in the last 168 hours.  BNPNo results for input(s): "BNP", "PROBNP" in the last 168 hours.  DDimer No results for input(s): "DDIMER" in the last 168 hours.   Radiology/Studies:  DG Chest Port 1 View Result Date: 04/29/2023 CLINICAL DATA:  Weakness. EXAM: PORTABLE CHEST 1 VIEW COMPARISON:  Chest radiograph dated April 21, 2023. FINDINGS: Low lung volumes with accentuation of the cardiomediastinal silhouette. No focal consolidation. The left costophrenic angle is not completely included within the field of view. No sizable pleural effusion or pneumothorax identified. No acute osseous abnormality. IMPRESSION: Low  lung volume.  No acute cardiopulmonary findings. Electronically Signed   By: Hart Robinsons M.D.   On: 04/29/2023 10:34     Assessment and Plan:   Troponin elevation Abnormal EKG CAD - hsTn 2646. In the setting of shock and DKA  - EKG on arrival showed sinus rhythm with nonspecific IVCD, inverted T waves in lead aVL, minimal (less than 1 box) ST elevation in the 3, V4.  Qtc 504 - Overall, EKG appears to be unchanged compared to EKG from 12/19.  - K 6.00 in the ED- likely contributing to EKG findings. Being corrected per primary team  - Patient has a known history of multivessel CAD- most recent cath from 07/2022 showed 2 vessel occlusive CAD. LAD occluded after small diag. RCA diffusely disease and occluded distally. No targets for revascularization  - Patient altered on exam. Creatinine 3.95. She is DNR (no intubation of CPR). With her current presentation and known history of multivessel CAD with no targets for intervention, I do not think that the benefits of catheterization outweigh the risks. Overall, not a good cath candidate  - Trend Trops- if significant increase, could consider IV heparin - Ordered echocardiogram  - Repeat EKG after electrolytes are corrected. Avoid QT prolonging medications   Ischemic  cardiomyopathy - Most recent echocardiogram from 07/2022 showed 30-35% with regional wall motion abnormalities, moderate LHV, mildly reduced RV function  - CXR showed no acute cardiopulmonary findings. She does not appear grossly volume overloaded on exam  - Ordered repeat echo this admission  - GDMT held with low BP   Otherwise, per primary -Shock -DKA -Type 1 diabetes -Metabolic encephalopathy -History of seizures -History of CVA on Coumadin -AKI on CKD stage IV -Anemia of chronic disease -Protein calorie malnutrition    Risk Assessment/Risk Scores:    For questions or updates, please contact [facility] Please consult [email or web address] for contact info under    Signed, Jonita Albee, PA-C  04/29/2023 4:11 PM    Patient seen and examined. Agree with comprehensive assessment and plan as noted above.  Patient is a 42 year old female who developed type 1 diabetes mellitus at age 67/9.  She has complex history and has documentation of ischemic cardiomyopathy with total occlusion of mid LAD as well as distal RCA disease has been felt not to be amenable to revascularization.  Ultimately she has been treated medically.  Currently, she has had several admissions with troponin elevation contributed by her multivessel obstruction and demand ischemia.  She has had 4 different evaluations over the past several months and most recently was admitted on April 21, 2023 with DKA, altered mental status after insulin pump broke the night before.  CT of her head did not show acute intracranial abnormality.  ECG during that admission sinus rhythm at 87 bpm.  She had mild ST elevation inferiorly and in leads V3 through V6.  She subsequently R   ee presented to the emergency room a day after discharge April 25, 2023 after a fall.  She presents today with altered mental status, marked hyperglycemia with glucose evaded at 832, lactic acidosis with lactate level of 3.1, potasium 6.0 with  acidosis.  Troponins are positive consistent with demand ischemia.  On exam she is not very responsive.  Blood pressure 112/86. Pulse  in the 90s.  There is no JVD.  No wheezing.  Rhythm is regular with  1-2/6 systolic murmur.  Left sternal border and apex.  Abdomen nontender.  Mild edema.  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  Her EKG today sinus rhythm.  There is previously noted 1 mm J-point elevation inferiorly and V3-4.  I had a long talk with the patient's husband who was in the room.  The patient has previously expressed she does not want any CPR or potential intubation and is a DNR.  Will recheck echo Doppler study for reassessment of LV systolic and diastolic function.  Follow-up ECG.  Correct electrolytes. CBC from from today still pending.  Await coagulation profile.  Consider low-dose heparin if stable hematologic profile.  Lennette Bihari, MD, [facility] 04/29/2023 5:40 PM

## 2023-04-29 NOTE — ED Notes (Signed)
ED TO INPATIENT HANDOFF REPORT  ED Nurse Name and Phone #: Cammy Copa, RN  S Name/Age/Gender Lauren [name] 42 y.o. female Room/Bed: APOTF/OTF  Code Status   Code Status: Do not attempt resuscitation (DNR) PRE-ARREST INTERVENTIONS DESIRED  Home/SNF/Other Home Patient oriented to: none Is this baseline? No   Triage Complete: Triage complete  Chief Complaint DKA (diabetic ketoacidosis) (HCC) [E11.10]  Triage Note Pt brought in by [facility] from home with c/o weakness to left side of body that started at 6am. EMS reports SBP 59, HR 80, O2 sat 100 RA, CBG >600. Pt is confused as to why she's here, but reports she knows she's at the hospital. Pt appears to be moving all her extremities the same except her left arm that is in a sling, which pt reports is broken.     Allergies Allergies  Allergen Reactions   Visipaque [Iodixanol] Nausea And Vomiting    Patient had vagal response during procedure became hypertensive, and bradycardic. CO2 used during procedure prior to contrast being used, this may be cause as well. Recommended premedicating prior to contrast being used in the future.    Wellbutrin [Bupropion] Hives   Gabapentin     dizzy   Cefepime Rash    Tolerates penicilllin   Ciprofloxacin Hcl Hives and Rash    Hives/rash at injection site; 01/15/22 tolerated IV cipro   Tape Rash    Level of Care/Admitting Diagnosis ED Disposition     ED Disposition  Admit   Condition  --   Comment  Hospital Area: [facility] [[id number]]  Level of Care: ICU [6]  May admit patient to [facility] or [facility] if equivalent level of care is available:: No  Interfacility transfer: Yes  Covid Evaluation: Asymptomatic - no recent exposure (last 10 days) testing not required  Diagnosis: DKA (diabetic ketoacidosis) (HCC) [213086]  Admitting Physician: Leslye Peer [[id number]]  Attending Physician: Leslye Peer [[id number]]  Certification:: I certify this patient will need inpatient  services for at least 2 midnights  Expected Medical Readiness: 05/04/2023          B Medical/Surgery History Past Medical History:  Diagnosis Date   Anemia    CAD (coronary artery disease)    a. s/p cath in 03/2014 showing 30% mid-LAD, moderate to severe disease along small D1, patent LCx, moderate to severe distal OM2 stenosis and moderate diffuse diease along RCA not amenable to PCI   CHF (congestive heart failure) (HCC)    a. EF 55-60% in 12/2019 b. EF at 35-40% by echo in 05/2020   CKD (chronic kidney disease) stage 4, GFR 15-29 ml/min (HCC)    Diabetes mellitus without complication (HCC)    Myocardial infarction (HCC)    Neuropathy    PVD (peripheral vascular disease) (HCC)    Stroke (HCC) 01/2022   L hand weakness   Past Surgical History:  Procedure Laterality Date   ABDOMINAL AORTOGRAM W/LOWER EXTREMITY N/A 06/10/2022   Procedure: ABDOMINAL AORTOGRAM W/LOWER EXTREMITY;  Surgeon: Cephus Shelling, MD;  [facility]: [facility] INVASIVE CV LAB;  Service: Cardiovascular;  Laterality: N/A;   AMPUTATION Left 09/02/2021   Procedure: AMPUTATION RAY;  Surgeon: Edwin Cap, DPM;  Location: [facility] OR;  Service: Podiatry;  Laterality: Left;  sagittal saw, 3L bag saline & Pulse   Cardiac catherization     CHOLECYSTECTOMY     I & D EXTREMITY Left 08/30/2021   Procedure: IRRIGATION AND DEBRIDEMENT WITH BONE BIOPSY;  Surgeon: Candelaria Stagers, DPM;  Location: [facility] OR;  Service: Podiatry;  Laterality: Left;   IR ANGIO VERTEBRAL SEL SUBCLAVIAN INNOMINATE UNI L MOD SED  01/18/2022   IR CT HEAD LTD  01/08/2022   IR INTRA CRAN STENT  01/08/2022   IR PERCUTANEOUS ART THROMBECTOMY/INFUSION INTRACRANIAL INC DIAG ANGIO  01/08/2022   IRRIGATION AND DEBRIDEMENT FOOT Left 09/04/2021   Procedure: IRRIGATION AND DEBRIDEMENT FOOT AND CLOSURE;  Surgeon: Edwin Cap, DPM;  Location: [facility] OR;  Service: Podiatry;  Laterality: Left;   LEFT HEART CATH AND CORONARY ANGIOGRAPHY N/A 07/26/2022   Procedure: LEFT HEART CATH AND  CORONARY ANGIOGRAPHY;  Surgeon: [place], Peter M, MD;  Location: [facility] INVASIVE CV LAB;  Service: Cardiovascular;  Laterality: N/A;   METATARSAL OSTEOTOMY Left 07/21/2022   Procedure: LEFT FOOT EXCISION OF FIFTH METATARSAL WITH DEBRIDEMENT OF ULCER, POSSIBLE BONE BIOPSY OF THE FOURTH METATARSAL, POSSIBLE FOURTH RAY AMPUTATION OF LEFT TOEAND METATARSAL;  Surgeon: Vivi Barrack, DPM;  Location: [facility];  Service: Podiatry;  Laterality: Left;   PERIPHERAL VASCULAR BALLOON ANGIOPLASTY Left 06/10/2022   Procedure: PERIPHERAL VASCULAR BALLOON ANGIOPLASTY;  Surgeon: Cephus Shelling, MD;  [facility];  Service: Cardiovascular;  Laterality: Left;  AT and DP   PERIPHERAL VASCULAR INTERVENTION Left 06/10/2022   Procedure: PERIPHERAL VASCULAR INTERVENTION;  Surgeon: Cephus Shelling, MD;  Location: [facility] INVASIVE CV LAB;  Service: Cardiovascular;  Laterality: Left;  SFA   RADIOLOGY WITH ANESTHESIA N/A 01/08/2022   Procedure: IR WITH ANESTHESIA;  Surgeon: Radiologist, Medication, MD;  Location: [facility];  Service: Radiology;  Laterality: N/A;   THROMBECTOMY BRACHIAL ARTERY Right 11/18/2021   Procedure: RIGHT BRACHIAL, RADIAL, & ULNAR ARTERY THROMBECTOMY.;  Surgeon: Cephus Shelling, MD;  Location: [facility];  Service: Vascular;  Laterality: Right;   TUBAL LIGATION       A IV Location/Drains/Wounds Patient Lines/Drains/Airways Status     Active Line/Drains/Airways     Name Placement date Placement time Site Days   Peripheral IV 04/29/23 22 G 1" Right Other (Comment) 04/29/23  1013  Other (Comment)  less than 1   Peripheral IV 04/29/23 20 G 1" Anterior;Right Forearm 04/29/23  1018  Forearm  less than 1   CVC Triple Lumen 04/29/23 Left Femoral 04/29/23  1330  -- less than 1   Wound / Incision (Open or Dehisced) 01/28/22 Irritant Dermatitis (Moisture Associated Skin Damage) Sacrum Posterior;Upper slightly red, little skin breakdown 01/28/22  1653  Sacrum  456   Wound / Incision (Open or Dehisced)  Other (Comment) Foot Left;Posterior --  --  Foot  --            Intake/Output Last 24 hours No intake or output data in the 24 hours ending 04/29/23 1546  Labs/Imaging Results for orders placed or performed during the hospital encounter of 04/29/23 (from the past 48 hours)  CBG monitoring, ED     Status: Abnormal   Collection Time: 04/29/23  9:34 AM  Result Value Ref Range   Glucose-Capillary >600 (HH) 70 - 99 mg/dL    Comment: Glucose reference range applies only to samples taken after fasting for at least 8 hours.  Lactic acid, plasma     Status: Abnormal   Collection Time: 04/29/23  9:37 AM  Result Value Ref Range   Lactic Acid, Venous 3.1 (HH) 0.5 - 1.9 mmol/L    Comment: CRITICAL RESULT CALLED TO, READ BACK BY AND VERIFIED WITH DODD M @ 1214 ON [id number] BY HENDERSON L Performed at [facility],  [address], [place], [place] [address]   Resp panel by RT-PCR (RSV, Flu A&B, Covid) Anterior Nasal Swab     Status: None   Collection Time: 04/29/23  9:59 AM   Specimen: Anterior Nasal Swab  Result Value Ref Range   SARS Coronavirus 2 by RT PCR NEGATIVE NEGATIVE    Comment: (NOTE) SARS-CoV-2 target nucleic acids are NOT DETECTED.  The SARS-CoV-2 RNA is generally detectable in upper respiratory specimens during the acute phase of infection. The lowest concentration of SARS-CoV-2 viral copies this assay can detect is 138 copies/mL. A negative result does not preclude SARS-Cov-2 infection and should not be used as the sole basis for treatment or other patient management decisions. A negative result may occur with  improper specimen collection/handling, submission of specimen other than nasopharyngeal swab, presence of viral mutation(s) within the areas targeted by this assay, and inadequate number of viral copies(<138 copies/mL). A negative result must be combined with clinical observations, patient history, and epidemiological information. The expected result is  Negative.  Fact Sheet for Patients:  BloggerCourse.com  Fact Sheet for Healthcare Providers:  SeriousBroker.it  This test is no t yet approved or cleared by the [place] FDA and  has been authorized for detection and/or diagnosis of SARS-CoV-2 by FDA under an Emergency Use Authorization (EUA). This EUA will remain  in effect (meaning this test can be used) for the duration of the COVID-19 declaration under Section 564(b)(1) of the Act, 21 U.S.C.section 360bbb-3(b)(1), unless the authorization is terminated  or revoked sooner.       Influenza A by PCR NEGATIVE NEGATIVE   Influenza B by PCR NEGATIVE NEGATIVE    Comment: (NOTE) The Xpert Xpress SARS-CoV-2/FLU/RSV plus assay is intended as an aid in the diagnosis of influenza from Nasopharyngeal swab specimens and should not be used as a sole basis for treatment. Nasal washings and aspirates are unacceptable for Xpert Xpress SARS-CoV-2/FLU/RSV testing.  Fact Sheet for Patients: BloggerCourse.com  Fact Sheet for Healthcare Providers: SeriousBroker.it  This test is not yet approved or cleared by the [place] FDA and has been authorized for detection and/or diagnosis of SARS-CoV-2 by FDA under an Emergency Use Authorization (EUA). This EUA will remain in effect (meaning this test can be used) for the duration of the COVID-19 declaration under Section 564(b)(1) of the Act, 21 U.S.C. section 360bbb-3(b)(1), unless the authorization is terminated or revoked.     Resp Syncytial Virus by PCR NEGATIVE NEGATIVE    Comment: (NOTE) Fact Sheet for Patients: BloggerCourse.com  Fact Sheet for Healthcare Providers: SeriousBroker.it  This test is not yet approved or cleared by the [place] FDA and has been authorized for detection and/or diagnosis of SARS-CoV-2 by FDA under an  Emergency Use Authorization (EUA). This EUA will remain in effect (meaning this test can be used) for the duration of the COVID-19 declaration under Section 564(b)(1) of the Act, 21 U.S.C. section 360bbb-3(b)(1), unless the authorization is terminated or revoked.  Performed at [facility], [address], [place], [place] [address]   Beta-hydroxybutyric acid     Status: Abnormal   Collection Time: 04/29/23 11:23 AM  Result Value Ref Range   Beta-Hydroxybutyric Acid >8.00 (H) 0.05 - 0.27 mmol/L    Comment: RESULT CONFIRMED BY MANUAL DILUTION Performed at [facility], [address], [place], [place] [address]   Comprehensive metabolic panel     Status: Abnormal   Collection Time: 04/29/23 11:28 AM  Result Value Ref Range   Sodium 124 (L)  135 - 145 mmol/L   Potassium 6.0 (H) 3.5 - 5.1 mmol/L   Chloride 94 (L) 98 - 111 mmol/L   CO2 <7 (L) 22 - 32 mmol/L   Glucose, Bld 832 (HH) 70 - 99 mg/dL    Comment: CRITICAL RESULT CALLED TO, READ BACK BY AND VERIFIED WITH DOSS @ 1230 ON [id number] BY HENDERSON L Glucose reference range applies only to samples taken after fasting for at least 8 hours.    BUN 46 (H) 6 - 20 mg/dL   Creatinine, Ser 6.43 (H) 0.44 - 1.00 mg/dL   Calcium 7.2 (L) 8.9 - 10.3 mg/dL   Total Protein 4.8 (L) 6.5 - 8.1 g/dL   Albumin 2.0 (L) 3.5 - 5.0 g/dL   AST 25 15 - 41 U/L   ALT 20 0 - 44 U/L   Alkaline Phosphatase 74 38 - 126 U/L   Total Bilirubin 1.6 (H) <1.2 mg/dL   GFR, Estimated 14 (L) >60 mL/min    Comment: (NOTE) Calculated using the CKD-EPI Creatinine Equation (2021)    Anion gap NOT CALCULATED 5 - 15    Comment: ELECTROLYTES REPEATED TO VERIFY Performed at [facility], [address] [place], [place] [address]   Troponin I (High Sensitivity)     Status: Abnormal   Collection Time: 04/29/23 11:28 AM  Result Value Ref Range   Troponin I (High Sensitivity) 2,646 (HH) <18 ng/L    Comment: CRITICAL RESULT CALLED TO, READ BACK BY AND VERIFIED WITH DOSS M @ 1231  ON [id number] B Y HENDERSON L (NOTE) Elevated high sensitivity troponin I (hsTnI) values and significant  changes across serial measurements may suggest ACS but many other  chronic and acute conditions are known to elevate hsTnI results.  Refer to the "Links" section for chest pain algorithms and additional  guidance. Performed at [facility], [address] [place], [place] [address]   CBG monitoring, ED     Status: Abnormal   Collection Time: 04/29/23 12:25 PM  Result Value Ref Range   Glucose-Capillary >600 (HH) 70 - 99 mg/dL    Comment: Glucose reference range applies only to samples taken after fasting for at least 8 hours.  CBG monitoring, ED     Status: Abnormal   Collection Time: 04/29/23  2:11 PM  Result Value Ref Range   Glucose-Capillary >600 (HH) 70 - 99 mg/dL    Comment: Glucose reference range applies only to samples taken after fasting for at least 8 hours.  CBG monitoring, ED     Status: Abnormal   Collection Time: 04/29/23  2:49 PM  Result Value Ref Range   Glucose-Capillary >600 (HH) 70 - 99 mg/dL    Comment: Glucose reference range applies only to samples taken after fasting for at least 8 hours.   DG Chest Port 1 View Result Date: 04/29/2023 CLINICAL DATA:  Weakness. EXAM: PORTABLE CHEST 1 VIEW COMPARISON:  Chest radiograph dated April 21, 2023. FINDINGS: Low lung volumes with accentuation of the cardiomediastinal silhouette. No focal consolidation. The left costophrenic angle is not completely included within the field of view. No sizable pleural effusion or pneumothorax identified. No acute osseous abnormality. IMPRESSION: Low lung volume.  No acute cardiopulmonary findings. Electronically Signed   By: Hart Robinsons M.D.   On: 04/29/2023 10:34    Pending Labs Unresulted Labs (From admission, onward)     Start     Ordered   04/29/23 1120  CBC with Differential/Platelet  Once,   STAT  04/29/23 1120   04/29/23 1120  Protime-INR  Once,   STAT         04/29/23 1120   04/29/23 1102  Basic metabolic panel  (Diabetes Ketoacidosis (DKA))  STAT Now then every 4 hours ,   STAT      04/29/23 1102   04/29/23 1102  Blood gas, venous  (Diabetes Ketoacidosis (DKA))  ONCE - STAT,   STAT        04/29/23 1102   04/29/23 0938  Urinalysis, w/ Reflex to Culture (Infection Suspected) -Urine, Catheterized  (Septic presentation on arrival (screening labs, nursing and treatment orders for obvious sepsis))  ONCE - URGENT,   URGENT       Question:  Specimen Source  Answer:  Urine, Catheterized   04/29/23 0939   04/29/23 0937  Lactic acid, plasma  (Septic presentation on arrival (screening labs, nursing and treatment orders for obvious sepsis))  STAT Now then every 2 hours,   R (with STAT occurrences)      04/29/23 0939   04/29/23 0937  CBC with Differential  (Septic presentation on arrival (screening labs, nursing and treatment orders for obvious sepsis))  ONCE - STAT,   STAT        04/29/23 0939   04/29/23 0937  Blood Culture (routine x 2)  (Septic presentation on arrival (screening labs, nursing and treatment orders for obvious sepsis))  BLOOD CULTURE X 2,   STAT      04/29/23 0939            Vitals/Pain Today's Vitals   04/29/23 1500 04/29/23 1505 04/29/23 1510 04/29/23 1515  BP: 97/62 108/60 107/67 118/66  Pulse: 89 90 92 91  Resp: 18 16 17  (!) 22  Temp:      TempSrc:      SpO2: 100% 100% 100% 100%  Weight:      Height:      PainSc:        Isolation Precautions No active isolations  Medications Medications  lactated ringers infusion ( Intravenous Not Given 04/29/23 1312)  insulin regular, human (MYXREDLIN) 100 units/ 100 mL infusion (8 Units/hr Intravenous New Bag/Given 04/29/23 1254)  lactated ringers infusion ( Intravenous New Bag/Given 04/29/23 1243)  dextrose 5 % in lactated ringers infusion (has no administration in time range)  dextrose 50 % solution 0-50 mL (has no administration in time range)  lactated ringers bolus 1,944 mL  (has no administration in time range)  norepinephrine (LEVOPHED) 4mg  in (0.016 mg/mL) premix infusion (2 mcg/min Intravenous New Bag/Given 04/29/23 1423)  lactated ringers bolus 1,000 mL (0 mLs Intravenous Stopped 04/29/23 1151)    And  lactated ringers bolus 1,000 mL (0 mLs Intravenous Stopped 04/29/23 1244)    And  lactated ringers bolus 1,000 mL (0 mLs Intravenous Stopped 04/29/23 1414)  aztreonam (AZACTAM) 2 g in sodium chloride 0.9 % 100 mL IVPB (0 g Intravenous Stopped 04/29/23 1205)  metroNIDAZOLE (FLAGYL) IVPB 500 mg (0 mg Intravenous Stopped 04/29/23 1313)  vancomycin (VANCOCIN) IVPB 1000 mg/200 mL premix (0 mg Intravenous Stopped 04/29/23 1414)    Mobility non-ambulatory     Focused Assessments GCS 5   R Recommendations: See Admitting Provider Note  Report given to: Julianne RN  Additional Notes:

## 2023-04-29 NOTE — Inpatient Diabetes Management (Signed)
Inpatient Diabetes Program Recommendations  AACE/ADA: New Consensus Statement on Inpatient Glycemic Control (2015)  Target Ranges:  Prepandial:   less than 140 mg/dL      Peak postprandial:   less than 180 mg/dL (1-2 hours)      Critically ill patients:  140 - 180 mg/dL   Lab Results  Component Value Date   GLUCAP >600 (HH) 04/29/2023   HGBA1C 8.7 (H) 04/22/2023    Review of Glycemic Control  Diabetes history: DM1 Outpatient Diabetes medications: Omnipod insulin pump Current orders for Inpatient glycemic control: IV insulin  Inpatient Diabetes Program Recommendations:   Noted that patient was admitted to the ED with blood sugars >600 mg/dl. Noted that patient was just discharged from hospital on 12/22. Insulin pump to be discontinued and IV insulin drip started.   At last admission, insulin recommendations at that time for IV insulin drip transition: Semglee 25 units daily, Novolog 0-15 units correction scale every 4 hours, Novolog 5 units TID.   Will continue to monitor blood sugars while in the hospital.  Smith Mince RN BSN CDE Diabetes Coordinator Pager: [phone]  8am-5pm

## 2023-04-29 NOTE — ED Notes (Signed)
[facility] made aware of pt's wishes of DNI/DNR. Family unable to find original signed copy of Pt's wishes and per [facility] protocol, pt will be full code en route to [facility]. Family/HPOA educated on this and agreed, this nurse as a witness. Verbal consent given for transport.

## 2023-04-29 NOTE — ED Triage Notes (Addendum)
Pt brought in by [facility] from home with c/o weakness to left side of body that started at 6am. EMS reports SBP 59, HR 80, O2 sat 100 RA, CBG >600. Pt is confused as to why she's here, but reports she knows she's at the hospital. Pt appears to be moving all her extremities the same except her left arm that is in a sling, which pt reports is broken.

## 2023-04-29 NOTE — Progress Notes (Addendum)
[facility] Physician-Brief Progress Note Patient Name: Lauren [name] DOB: 07-14-1980 MRN: [id number]   Date of Service  04/29/2023  HPI/Events of Note  Elevated INR and BG  eICU Interventions      Minimal bleeding/oozing per RN from femoral ine site INR 9.6 Hgb >10 in am  Plan Vit K Thrombin pad Check H&H Repeat INR in am If any serious bleed will consider FFP/PCC  Increase insulin drip per protocol  Also troponin increased to 6000+ form 2000+ EKG earlier showed no change from prev Nonspecific ST changes Repeat EKG  EKG reviewed  Now with changes in V2 compared to previous Ant infarct is a possibility Reviewed Cards note from earlier  On previous admission  (patient has known multivessel coronary artery disease without targets for vascularization). Felt that risk of renal injury with coronary angiogram outweighed the benefits and patient was managed medically. Imdur was increased to 120 mg daily. Patient remained on Plavix, Toprol-XL, Ranexa, as needed Lasix, Coumadin  Pt creatinine 3.95 and GFR 14 BNP 19000+ .  Pt remains a high risk for intervention Will discuss further with cardiology. Ddiscussed with cards Diurese as needed Monitor EKG Electrolytes replacement No intervention at this time      Massie Maroon 04/29/2023, 9:50 PM

## 2023-04-29 NOTE — ED Notes (Signed)
Pt's family very vocal about suing hospital if pt is intubated or CPR is performed en route to [facility] regardless of the agreement made to transport pt as full code. [facility] explained that pt is full code during transport due to not having original signed paper copy of pt's wishes. Family still agreed to transport after education was done.

## 2023-04-29 NOTE — ED Notes (Signed)
ED Provider at bedside. 

## 2023-04-29 NOTE — Sepsis Progress Note (Signed)
[facility] following sepsis protocol. Thank you.

## 2023-04-29 NOTE — H&P (Signed)
NAME:  Lauren [name], MRN:  [id number], DOB:  07/04/1980, LOS: 0 ADMISSION DATE:  04/29/2023, CONSULTATION DATE:  04/29/23 REFERRING MD:  Dr. Denton Lank, CHIEF COMPLAINT:  AMS   History of Present Illness:  Pt encephalopathic, therefore HPI obtained from EMR review.  42yoF with PMH as below significant for type 1 DM (uses insulin pump), seizures, CVA on coumadin, CKD stage 4 . Kown CAD with NSTEMI in July 2024. , CKD with baselin creat 3s. Recently seen in ER for left proximal humerus fx s/p fall 12/23.  Recent admits 12/19- 12/22 for DKA and UTI and 03/2023 with seizure, DKA, and AKI. Then 04/29/2023 - who presented with confusion and weakness from home to [facility].  CBG> 600.  Afebrile and hypotensive in ER.  Found to be in recurrent DKA with bicarb < 7, glucose 832, beta-hydroxybutyric acid > 8, K 6, sCr 3.95, pending CBC and UA Concern for sepsis w/ recent UTI, cultured and started on empiric vanc, flagyl, and aztreonam started.  Insulin gtt started.  CXR without acute process.  .  Remains hypotensive despite 3L LR, starting pressors. Pt to be transferred to [facility] for further ICU care.    Per EDP, verified prior code status with pt and her husband, verified no CPR/ intubation.   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   reports that she quit smoking about 15 months ago. Her smoking use included cigarettes. She started smoking about 16 years ago. She has a 15 pack-year smoking history. She has been exposed to tobacco smoke. She has  never used smokeless tobacco.  Past Surgical History:  Procedure Laterality Date   ABDOMINAL AORTOGRAM W/LOWER EXTREMITY N/A 06/10/2022   Procedure: ABDOMINAL AORTOGRAM W/LOWER EXTREMITY;  Surgeon: Cephus Shelling, MD;  Location: [facility] INVASIVE CV LAB;  Service: Cardiovascular;  Laterality: N/A;   AMPUTATION Left 09/02/2021   Procedure: AMPUTATION RAY;  Surgeon: Edwin Cap, DPM;  Location: [facility] OR;  Service: Podiatry;  Laterality: Left;  sagittal saw, 3L bag saline & Pulse   Cardiac catherization     CHOLECYSTECTOMY     I & D EXTREMITY Left 08/30/2021   Procedure: IRRIGATION AND DEBRIDEMENT WITH BONE BIOPSY;  Surgeon: Candelaria Stagers, DPM;  Location: [facility] OR;  Service: Podiatry;  Laterality: Left;   IR ANGIO VERTEBRAL SEL SUBCLAVIAN INNOMINATE UNI L MOD SED  01/18/2022   IR CT HEAD LTD  01/08/2022   IR INTRA CRAN STENT  01/08/2022   IR PERCUTANEOUS ART THROMBECTOMY/INFUSION INTRACRANIAL INC DIAG ANGIO  01/08/2022   IRRIGATION AND DEBRIDEMENT FOOT Left 09/04/2021   Procedure: IRRIGATION AND DEBRIDEMENT FOOT AND CLOSURE;  Surgeon: Edwin Cap, DPM;  Location: [facility] OR;  Service: Podiatry;  Laterality: Left;   LEFT HEART CATH AND CORONARY ANGIOGRAPHY N/A 07/26/2022   Procedure: LEFT HEART CATH AND CORONARY ANGIOGRAPHY;  Surgeon: [place], Peter M, MD;  Location: [facility] INVASIVE CV LAB;  Service: Cardiovascular;  Laterality: N/A;   METATARSAL OSTEOTOMY Left 07/21/2022   Procedure: LEFT FOOT EXCISION OF FIFTH METATARSAL WITH DEBRIDEMENT OF ULCER, POSSIBLE BONE BIOPSY OF THE FOURTH METATARSAL, POSSIBLE FOURTH  RAY AMPUTATION OF LEFT TOEAND METATARSAL;  Surgeon: Vivi Barrack, DPM;  Location: [facility] OR;  Service: Podiatry;  Laterality: Left;   PERIPHERAL VASCULAR BALLOON ANGIOPLASTY Left 06/10/2022   Procedure: PERIPHERAL VASCULAR BALLOON ANGIOPLASTY;  Surgeon: Cephus Shelling, MD;  Location: [facility];  Service: Cardiovascular;  Laterality: Left;  AT and DP   PERIPHERAL VASCULAR INTERVENTION Left  06/10/2022   Procedure: PERIPHERAL VASCULAR INTERVENTION;  Surgeon: Cephus Shelling, MD;  Location: [facility] INVASIVE CV LAB;  Service: Cardiovascular;  Laterality: Left;  SFA   RADIOLOGY WITH ANESTHESIA N/A 01/08/2022   Procedure: IR WITH ANESTHESIA;  Surgeon: Radiologist, Medication, MD;  Location: [facility];  Service: Radiology;  Laterality: N/A;   THROMBECTOMY BRACHIAL ARTERY Right 11/18/2021   Procedure: RIGHT BRACHIAL, RADIAL, & ULNAR ARTERY THROMBECTOMY.;  Surgeon: Cephus Shelling, MD;  Location: [facility];  Service: Vascular;  Laterality: Right;   TUBAL LIGATION      Allergies  Allergen Reactions   Visipaque [Iodixanol] Nausea And Vomiting    Patient had vagal response during procedure became hypertensive, and bradycardic. CO2 used during procedure prior to contrast being used, this may be cause as well. Recommended premedicating prior to contrast being used in the future.    Wellbutrin [Bupropion] Hives   Gabapentin     dizzy   Cefepime Rash    Tolerates penicilllin   Ciprofloxacin Hcl Hives and Rash    Hives/rash at injection site; 01/15/22 tolerated IV cipro   Tape Rash    Immunization History  Administered Date(s) Administered   Influenza-Unspecified 05/01/2021   Moderna Sars-Covid-2 Vaccination 09/23/2019, 10/28/2019    Family History  Problem Relation Age of Onset   Thyroid disease Mother    Hypertension Mother    Hyperlipidemia Mother    CAD Mother    CVA Mother    Hyperlipidemia Father    Hypertension Father    CAD Father    Breast cancer Paternal Grandmother 25   Cancer Paternal Grandmother        lung     Current Facility-Administered Medications:    dextrose 5 % in lactated ringers infusion, , Intravenous, Continuous, Cathren Laine, MD   dextrose 50 % solution 0-50 mL, 0-50 mL, Intravenous, PRN, Cathren Laine, MD   insulin regular, human (MYXREDLIN) 100 units/ 100 mL infusion, , Intravenous, Continuous, Cathren Laine, MD, Last Rate: 8 mL/hr at 04/29/23 1700, 8  Units/hr at 04/29/23 1700   lactated ringers bolus 1,944 mL, 20 mL/kg, Intravenous, Once, Cathren Laine, MD   lactated ringers infusion, , Intravenous, Continuous, Cathren Laine, MD   lactated ringers infusion, , Intravenous, Continuous, Cathren Laine, MD, Last Rate: 125 mL/hr at 04/29/23 1700, Infusion Verify at 04/29/23 1700   norepinephrine (LEVOPHED) 4mg  in (0.016 mg/mL) premix infusion, 0-40 mcg/min, Intravenous, Titrated, Cathren Laine, MD, Last Rate: 7.5 mL/hr at 04/29/23 1700, 2 mcg/min at 04/29/23 1700   Significant Hospital Events: Including procedures, antibiotic start and stop dates in addition to other pertinent events   JUly 2023 - critical ischemia of LUE EF 40-45% Sept 2023 - Acute R MCA stroke March 2024 - Left diabetic foot with Acute osteomyelitis of fifth metatarsal-s/p excision of left fifth metatarsal and bone biopsy of fourth metatarsal on 3/20  Echocardiogram with EF 30-35% APril 2024 and July 2024 with chest pain due to CAD 03/13/2023 - 03/16/2203 with seizure, DKA, and AKI. Recent admits 12/19- 04/24/2023 for DKA and UTI and  04/25/2023: ER visit for left proximal humerus fracture and discharged  on opioid 04/29/2023 - ADMIT DKA. ESTablisedh DNR - "family very vocal about suing hospital if pt is intubated or CPR "  Interim History / Subjective:   -12/27/20244 PM at [facility], ICU in Home: Daughter is indicated that the insulin pump is working well and sugars were fairly under control on 04/28/2023.  This morning was unsteady and an hour later was extremely lethargic and taken to the ED.  In the ED apparently patient was hypotensive.  According to daughter patient probably received pressors.  Currently normotensive and not on pressors.  Patient's confirmed DNR but family worried about ongoing encephalopathy and obtundation.  Patient is afebrile.  RN at the ICU indicate peripheral line sites moves easily [patient is on Plavix and Coumadin  Cardiology  consultation for elevated troponin: Not a good cardiac catheterization candidate if troponin increases then consider IV heparin.  Avoid QT prolongation medications.  Hold GDMT with low blood pressure.  Patient's heart grossly volume overloaded.  Objective   Blood pressure (!) 88/74, pulse (!) 55, temperature (!) 97.5 F (36.4 C), temperature source Oral, resp. rate (!) 27, height 5\' 5"  (1.651 m), weight 97.2 kg, last menstrual period 04/24/2023, SpO2 100%.       No intake or output data in the 24 hours ending 04/29/23 1329 Filed Weights   04/29/23 0922  Weight: 97.2 kg   Examination: General: Obese lady HENT: Not on the ventilator.  Obese neck no neck nodes Lungs: No obvious distress clear to auscultation Cardiovascular: Normal heart sounds obese soft nontender Abdomen: Obese soft nontender Extremities: Overall intact but does have evidence of toe amputation Neuro: Eyes closed.  Periodically is able to monitor words and nod her head to voice but overall encephalopathic GU: Not examined  Resolved Hospital Problem list    Assessment & Plan:   Shock- concern for septic/ hypovolemic, ?cardiac with elevated troponin/ EKG changes Recurrent DKA Type 1 DM (uses insulin pump)  12/27 -> in ICUL: Now off pressors in the ICU.  Status post 2 L of fluid in the ER at [facility]   P:  -Mean arterial pressure goal greater than 65; Levophed if needed or Neo-Synephrine -DKA protocol  -Will give 1 L fluid bolus [EF 35% per cardiology does not think patient is volume overloaded and patient actually looks dry]  -Insulin infusion - NPO - BMET q4, trend beta-hydroxybutyrate, lactic   - DM coordinator consult  #HFrEF #Prolonged QTc #HTN #Troponin elevation; likely type II -seen by cardiology and recommend heparin only if the troponin goes up further.  Not a cardiac cath candidate #-prior echo 07/2022 showing EF 30-35%, +WMA, indeterminate diastolic, RVSF mildly reduced, mild to mod MR  Plan -  Track serial troponin -Can consider echocardiogram depending on course - GDMT limited  Metabolic encephalopathy Hx seizures Hx CVA on coumadin  04/29/2023: Obtunded encephalopathy probably typical for DKA  P:  - neuro checks - hold topamax, prn klonopin - ativan for seizure activity -Closely monitor - DO NOT INTUBATE if this gets worse   AKI on CKD 4 Pseudohyponatremia- corrects to 136 Hyperkalemia AGMA   P:  -DKA protocol - trend renal indices  - strict I/Os, daily wts - avoid nephrotoxins, renal dose meds, hemodynamic support as above      Anemia of chronic disease  Plan  - - PRBC for hgb </= 6.9gm%    - exceptions are   -  if ACS susepcted/confirmed then transfuse for hgb </= 8.0gm%,  or    -  active bleeding with hemodynamic instability, then transfuse regardless of hemoglobin value   At at all times try to transfuse 1 unit prbc as possible with exception of active hemorrhage    Protein calorie malnutrition FTT - NPO for now - ongoing GOC, DNR/ DNI  Best Practice (right click and "Reselect all SmartList Selections" daily)   Diet/type: NPO DVT prophylaxis Coumadin - > check INR Pressure ulcer(s): N/A GI prophylaxis: PPI Lines: N/A Foley:  N/A Code Status:  limited -no CPR and no intubation but vasopressors and full medical care okay Last date of multidisciplinary goals of care discussion [   Interdisciplinary Goals of Care Family Meeting   Date carried out:: 04/29/2023  Location of the meeting: Bedside  Member's involved: Physician, Bedside Registered Nurse, Family Member or next of kin, and Other: Husband, daughter and son and first cousin  Durable Power of Pensions consultant or Environmental health practitioner: Husband  Discussion: We discussed goals of care for [employer] .  yes  Code status: Full DNR but okay to treat with pressors and full medical care  Disposition: Continue current acute care        LABS    PULMONARY Recent  Labs  Lab 04/25/23 0856 04/29/23 1701  HCO3 24.8 8.6*  O2SAT 45.1 57.3    CBC Recent Labs  Lab 04/23/23 0541 04/25/23 0856  HGB 10.9* 10.5*  HCT 33.1* 32.0*  WBC 16.2* 8.7  PLT 304 267    COAGULATION Recent Labs  Lab 04/23/23 0541 04/24/23 0544  INR 3.1* 2.4*    CARDIAC  No results for input(s): "TROPONINI" in the last 168 hours. No results for input(s): "PROBNP" in the last 168 hours.   CHEMISTRY Recent Labs  Lab 04/23/23 0541 04/24/23 0544 04/25/23 0856 04/29/23 1128  NA 132* 139 134* 124*  K 4.1 3.3* 3.3* 6.0*  CL 101 105 105 94*  CO2 19* 25 23 <7*  GLUCOSE 193* 238* 302* 832*  BUN 38* 28* 21* 46*  CREATININE 3.70* 2.63* 2.45* 3.95*  CALCIUM 7.5* 7.9* 8.0* 7.2*   Estimated Creatinine Clearance: 22.3 mL/min (A) (by C-G formula based on SCr of 3.95 mg/dL (H)).   LIVER Recent Labs  Lab 04/23/23 0541 04/24/23 0544 04/25/23 0856 04/29/23 1128  AST  --   --  34 25  ALT  --   --  34 20  ALKPHOS  --   --  80 74  BILITOT  --   --  0.5 1.6*  PROT  --   --  6.1* 4.8*  ALBUMIN  --   --  2.8* 2.0*  INR 3.1* 2.4*  --   --      INFECTIOUS Recent Labs  Lab 04/29/23 0937  LATICACIDVEN 3.1*     ENDOCRINE CBG (last 3)  Recent Labs    04/29/23 1449 04/29/23 1638 04/29/23 1702  GLUCAP >600* >600* >600*         IMAGING x48h  - image(s) personally visualized  -   highlighted in bold DG Chest Port 1 View Result Date: 04/29/2023 CLINICAL DATA:  Weakness. EXAM: PORTABLE CHEST 1 VIEW COMPARISON:  Chest radiograph dated April 21, 2023. FINDINGS: Low lung volumes with accentuation of the cardiomediastinal silhouette. No focal consolidation. The left costophrenic angle is not completely included within the field of view. No sizable pleural effusion or pneumothorax identified. No acute osseous abnormality. IMPRESSION: Low lung volume.  No acute cardiopulmonary findings. Electronically Signed   By: Hart Robinsons M.D.   On: 04/29/2023  10:34    

## 2023-04-30 ENCOUNTER — Encounter (HOSPITAL_COMMUNITY): Payer: Self-pay | Admitting: Radiology

## 2023-04-30 ENCOUNTER — Other Ambulatory Visit (HOSPITAL_COMMUNITY): Payer: BC Managed Care – PPO

## 2023-04-30 ENCOUNTER — Inpatient Hospital Stay (HOSPITAL_COMMUNITY): Payer: BC Managed Care – PPO

## 2023-04-30 DIAGNOSIS — R7989 Other specified abnormal findings of blood chemistry: Secondary | ICD-10-CM | POA: Diagnosis not present

## 2023-04-30 DIAGNOSIS — G9340 Encephalopathy, unspecified: Secondary | ICD-10-CM | POA: Diagnosis not present

## 2023-04-30 DIAGNOSIS — R9431 Abnormal electrocardiogram [ECG] [EKG]: Secondary | ICD-10-CM | POA: Diagnosis not present

## 2023-04-30 DIAGNOSIS — I214 Non-ST elevation (NSTEMI) myocardial infarction: Secondary | ICD-10-CM | POA: Diagnosis not present

## 2023-04-30 LAB — HEPATIC FUNCTION PANEL
ALT: 24 U/L (ref 0–44)
AST: 36 U/L (ref 15–41)
Albumin: 2 g/dL — ABNORMAL LOW (ref 3.5–5.0)
Alkaline Phosphatase: 80 U/L (ref 38–126)
Bilirubin, Direct: <0.1 mg/dL (ref 0.0–0.2)
Total Bilirubin: 0.5 mg/dL (ref <1.2)
Total Protein: 4.6 g/dL — ABNORMAL LOW (ref 6.5–8.1)

## 2023-04-30 LAB — CBC
HCT: 24.9 % — ABNORMAL LOW (ref 36.0–46.0)
Hemoglobin: 8.3 g/dL — ABNORMAL LOW (ref 12.0–15.0)
MCH: 29.7 pg (ref 26.0–34.0)
MCHC: 33.3 g/dL (ref 30.0–36.0)
MCV: 89.2 fL (ref 80.0–100.0)
Platelets: 383 10*3/uL (ref 150–400)
RBC: 2.79 MIL/uL — ABNORMAL LOW (ref 3.87–5.11)
RDW: 13.6 % (ref 11.5–15.5)
WBC: 21.2 10*3/uL — ABNORMAL HIGH (ref 4.0–10.5)
nRBC: 0 % (ref 0.0–0.2)

## 2023-04-30 LAB — URINALYSIS, ROUTINE W REFLEX MICROSCOPIC
Bilirubin Urine: NEGATIVE
Glucose, UA: >=500 mg/dL — AB
Ketones, ur: 5 mg/dL — AB
Leukocytes,Ua: NEGATIVE
Nitrite: NEGATIVE
Protein, ur: 100 mg/dL — AB
Specific Gravity, Urine: 1.008 (ref 1.005–1.030)
pH: 5 (ref 5.0–8.0)

## 2023-04-30 LAB — MAGNESIUM: Magnesium: 1.8 mg/dL (ref 1.7–2.4)

## 2023-04-30 LAB — GLUCOSE, CAPILLARY
Glucose-Capillary: 125 mg/dL — ABNORMAL HIGH (ref 70–99)
Glucose-Capillary: 150 mg/dL — ABNORMAL HIGH (ref 70–99)
Glucose-Capillary: 162 mg/dL — ABNORMAL HIGH (ref 70–99)
Glucose-Capillary: 169 mg/dL — ABNORMAL HIGH (ref 70–99)
Glucose-Capillary: 179 mg/dL — ABNORMAL HIGH (ref 70–99)
Glucose-Capillary: 196 mg/dL — ABNORMAL HIGH (ref 70–99)
Glucose-Capillary: 198 mg/dL — ABNORMAL HIGH (ref 70–99)
Glucose-Capillary: 232 mg/dL — ABNORMAL HIGH (ref 70–99)
Glucose-Capillary: 253 mg/dL — ABNORMAL HIGH (ref 70–99)
Glucose-Capillary: 280 mg/dL — ABNORMAL HIGH (ref 70–99)
Glucose-Capillary: 320 mg/dL — ABNORMAL HIGH (ref 70–99)
Glucose-Capillary: 370 mg/dL — ABNORMAL HIGH (ref 70–99)

## 2023-04-30 LAB — BASIC METABOLIC PANEL
Anion gap: 11 (ref 5–15)
Anion gap: 7 (ref 5–15)
Anion gap: 10 (ref 5–15)
BUN: 42 mg/dL — ABNORMAL HIGH (ref 6–20)
BUN: 43 mg/dL — ABNORMAL HIGH (ref 6–20)
BUN: 44 mg/dL — ABNORMAL HIGH (ref 6–20)
CO2: 18 mmol/L — ABNORMAL LOW (ref 22–32)
CO2: 21 mmol/L — ABNORMAL LOW (ref 22–32)
CO2: 17 mmol/L — ABNORMAL LOW (ref 22–32)
Calcium: 7.7 mg/dL — ABNORMAL LOW (ref 8.9–10.3)
Calcium: 7.7 mg/dL — ABNORMAL LOW (ref 8.9–10.3)
Calcium: 7.6 mg/dL — ABNORMAL LOW (ref 8.9–10.3)
Chloride: 106 mmol/L (ref 98–111)
Chloride: 109 mmol/L (ref 98–111)
Chloride: 108 mmol/L (ref 98–111)
Creatinine, Ser: 3.56 mg/dL — ABNORMAL HIGH (ref 0.44–1.00)
Creatinine, Ser: 3.39 mg/dL — ABNORMAL HIGH (ref 0.44–1.00)
Creatinine, Ser: 3.31 mg/dL — ABNORMAL HIGH (ref 0.44–1.00)
GFR, Estimated: 16 mL/min — ABNORMAL LOW (ref >60)
GFR, Estimated: 17 mL/min — ABNORMAL LOW (ref >60)
GFR, Estimated: 17 mL/min — ABNORMAL LOW (ref >60)
Glucose, Bld: 347 mg/dL — ABNORMAL HIGH (ref 70–99)
Glucose, Bld: 194 mg/dL — ABNORMAL HIGH (ref 70–99)
Glucose, Bld: 169 mg/dL — ABNORMAL HIGH (ref 70–99)
Potassium: 3.5 mmol/L (ref 3.5–5.1)
Potassium: 3.4 mmol/L — ABNORMAL LOW (ref 3.5–5.1)
Potassium: 3.4 mmol/L — ABNORMAL LOW (ref 3.5–5.1)
Sodium: 135 mmol/L (ref 135–145)
Sodium: 137 mmol/L (ref 135–145)
Sodium: 135 mmol/L (ref 135–145)

## 2023-04-30 LAB — ECHOCARDIOGRAM COMPLETE
Calc EF: 41.9 %
Height: 65 in
S' Lateral: 4 cm
Single Plane A2C EF: 48.2 %
Single Plane A4C EF: 36.1 %
Weight: 3495.61 [oz_av]

## 2023-04-30 LAB — MRSA NEXT GEN BY PCR, NASAL: MRSA by PCR Next Gen: NOT DETECTED

## 2023-04-30 LAB — BETA-HYDROXYBUTYRIC ACID
Beta-Hydroxybutyric Acid: 0.1 mmol/L (ref 0.05–0.27)
Beta-Hydroxybutyric Acid: 1.44 mmol/L — ABNORMAL HIGH (ref 0.05–0.27)
Beta-Hydroxybutyric Acid: 0.71 mmol/L — ABNORMAL HIGH (ref 0.05–0.27)

## 2023-04-30 LAB — PHOSPHORUS: Phosphorus: 3.3 mg/dL (ref 2.5–4.6)

## 2023-04-30 LAB — PROTIME-INR
INR: 2.8 — ABNORMAL HIGH (ref 0.8–1.2)
Prothrombin Time: 29.4 s — ABNORMAL HIGH (ref 11.4–15.2)

## 2023-04-30 LAB — PROCALCITONIN: Procalcitonin: 12.53 ng/mL

## 2023-04-30 LAB — TROPONIN I (HIGH SENSITIVITY): Troponin I (High Sensitivity): 7045 ng/L (ref <18)

## 2023-04-30 LAB — LACTIC ACID, PLASMA: Lactic Acid, Venous: 1.5 mmol/L (ref 0.5–1.9)

## 2023-04-30 MED ORDER — CEFTRIAXONE SODIUM 1 G IJ SOLR
1.0000 g | INTRAMUSCULAR | Status: DC
  Administered 2023-04-30 – 2023-05-03 (×4): 1 g via INTRAVENOUS
  Filled 2023-04-30 (×4): qty 10

## 2023-04-30 MED ORDER — WARFARIN - PHARMACIST DOSING INPATIENT: Freq: Every day | Status: DC

## 2023-04-30 MED ORDER — FENTANYL CITRATE PF 50 MCG/ML IJ SOSY
12.5000 ug | PREFILLED_SYRINGE | INTRAMUSCULAR | Status: DC | PRN
  Administered 2023-04-30: 12.5 ug via INTRAVENOUS
  Filled 2023-04-30: qty 1

## 2023-04-30 MED ORDER — LINEZOLID 600 MG/300ML IV SOLN
600.0000 mg | Freq: Two times a day (BID) | INTRAVENOUS | Status: DC
  Administered 2023-04-30 – 2023-05-01 (×3): 600 mg via INTRAVENOUS
  Filled 2023-04-30 (×3): qty 300

## 2023-04-30 MED ORDER — FENTANYL CITRATE PF 50 MCG/ML IJ SOSY
50.0000 ug | PREFILLED_SYRINGE | INTRAMUSCULAR | Status: DC | PRN
  Administered 2023-04-30 – 2023-05-01 (×6): 50 ug via INTRAVENOUS
  Filled 2023-04-30 (×6): qty 1

## 2023-04-30 MED ORDER — WARFARIN SODIUM 2.5 MG PO TABS
2.5000 mg | ORAL_TABLET | Freq: Once | ORAL | Status: AC
  Administered 2023-04-30: 2.5 mg via ORAL
  Filled 2023-04-30: qty 1

## 2023-04-30 MED ORDER — INSULIN GLARGINE-YFGN 100 UNIT/ML ~~LOC~~ SOLN
25.0000 [IU] | Freq: Every day | SUBCUTANEOUS | Status: DC
Start: 2023-04-30 — End: 2023-05-01
  Administered 2023-04-30 – 2023-05-01 (×2): 25 [IU] via SUBCUTANEOUS
  Filled 2023-04-30 (×2): qty 0.25

## 2023-04-30 MED ORDER — PERFLUTREN LIPID MICROSPHERE
1.0000 mL | INTRAVENOUS | Status: AC | PRN
  Administered 2023-04-30: 3 mL via INTRAVENOUS

## 2023-04-30 MED ORDER — ASPIRIN 81 MG PO TBEC
81.0000 mg | DELAYED_RELEASE_TABLET | Freq: Every day | ORAL | Status: DC
  Administered 2023-04-30 – 2023-05-03 (×4): 81 mg via ORAL
  Filled 2023-04-30 (×4): qty 1

## 2023-04-30 MED ORDER — CARVEDILOL 3.125 MG PO TABS
3.1250 mg | ORAL_TABLET | Freq: Two times a day (BID) | ORAL | Status: DC
  Administered 2023-04-30 – 2023-05-03 (×6): 3.125 mg via ORAL
  Filled 2023-04-30 (×6): qty 1

## 2023-04-30 MED ORDER — POTASSIUM CHLORIDE 10 MEQ/100ML IV SOLN
10.0000 meq | INTRAVENOUS | Status: AC
  Administered 2023-04-30 (×2): 10 meq via INTRAVENOUS
  Filled 2023-04-30: qty 100

## 2023-04-30 MED ORDER — INSULIN ASPART 100 UNIT/ML IJ SOLN
0.0000 [IU] | INTRAMUSCULAR | Status: DC
  Administered 2023-04-30: 2 [IU] via SUBCUTANEOUS
  Administered 2023-04-30: 8 [IU] via SUBCUTANEOUS
  Administered 2023-04-30: 3 [IU] via SUBCUTANEOUS
  Administered 2023-04-30: 5 [IU] via SUBCUTANEOUS
  Administered 2023-05-01: 2 [IU] via SUBCUTANEOUS
  Administered 2023-05-01: 3 [IU] via SUBCUTANEOUS

## 2023-04-30 MED ORDER — MAGNESIUM SULFATE 2 GM/50ML IV SOLN
2.0000 g | Freq: Once | INTRAVENOUS | Status: AC
  Administered 2023-04-30: 2 g via INTRAVENOUS
  Filled 2023-04-30: qty 50

## 2023-04-30 NOTE — Progress Notes (Signed)
Low k Low mag  Plan  - replete via IV     SIGNATURE    Dr. Kalman Shan, M.D., F.C.C.P,  Pulmonary and Critical Care Medicine Staff Physician, [facility] Center Director - Interstitial Lung Disease  Program  Pulmonary Fibrosis [facility] at [facility] [place], [place], [address]   Pager: [phone], If no answer  -> Check AMION or Try [phone] Telephone (clinical office): [phone] Telephone (research): [phone]  4:02 PM 04/30/2023    Recent Labs  Lab 04/29/23 1128 04/29/23 1958 04/30/23 0206 04/30/23 0446 04/30/23 0930  NA 124* 131* 135 137 135  K 6.0* 4.1 3.5 3.4* 3.4*  CL 94* 101 106 109 108  CO2 <7* 14* 18* 21* 17*  GLUCOSE 832* 611* 347* 194* 169*  BUN 46* 45* 42* 43* 44*  CREATININE 3.95* 3.95* 3.56* 3.39* 3.31*  CALCIUM 7.2* 7.5* 7.7* 7.7* 7.6*  MG  --  1.9  --   --  1.8  PHOS  --   --   --   --  3.3

## 2023-04-30 NOTE — Progress Notes (Signed)
Continued complaints of pain. Crying, extensive bruising and swelling to left arm . Weak pulses but able to wiggle fingers. Given updated fentanyl orders

## 2023-04-30 NOTE — Progress Notes (Signed)
  Echocardiogram 2D Echocardiogram has been performed.  Leda Roys RDCS 04/30/2023, 2:16 PM

## 2023-04-30 NOTE — Progress Notes (Signed)
Cardiologist:  Dominga Ferry  Subjective:   No chest pain resting comfortably in bed   Objective:  Vitals:   04/30/23 0715 04/30/23 0748 04/30/23 0800 04/30/23 0900  BP:   138/75 128/62  Pulse: (!) 103  (!) 101 (!) 101  Resp: (!) 27  19 20   Temp:  98.5 F (36.9 C)    TempSrc:  Oral    SpO2: 100%  100% 99%  Weight:      Height:        Intake/Output from previous day:  Intake/Output Summary (Last 24 hours) at 04/30/2023 1004 Last data filed at 04/30/2023 0727 Gross per 24 hour  Intake 4844.85 ml  Output 1650 ml  Net 3194.85 ml    Physical Exam:  Obese female Recent left humeral fracture No murmur  Lungs clear Triple lumen left femoral vein Plus one bilateral edema Amputation left 5 th toe   Lab Results: Basic Metabolic Panel: Recent Labs    04/29/23 1958 04/30/23 0206 04/30/23 0446  NA 131* 135 137  K 4.1 3.5 3.4*  CL 101 106 109  CO2 14* 18* 21*  GLUCOSE 611* 347* 194*  BUN 45* 42* 43*  CREATININE 3.95* 3.56* 3.39*  CALCIUM 7.5* 7.7* 7.7*  MG 1.9  --   --    Liver Function Tests: Recent Labs    04/29/23 1128 04/30/23 0446  AST 25 36  ALT 20 24  ALKPHOS 74 80  BILITOT 1.6* 0.5  PROT 4.8* 4.6*  ALBUMIN 2.0* 2.0*   No results for input(s): "LIPASE", "AMYLASE" in the last 72 hours. CBC: Recent Labs    04/29/23 2218 04/30/23 0446  WBC 16.2* 21.2*  NEUTROABS 14.2*  --   HGB 8.5* 8.3*  HCT 25.5* 24.9*  MCV 89.8 89.2  PLT 351 383     Imaging: DG Chest Port 1 View Result Date: 04/29/2023 CLINICAL DATA:  Weakness. EXAM: PORTABLE CHEST 1 VIEW COMPARISON:  Chest radiograph dated April 21, 2023. FINDINGS: Low lung volumes with accentuation of the cardiomediastinal silhouette. No focal consolidation. The left costophrenic angle is not completely included within the field of view. No sizable pleural effusion or pneumothorax identified. No acute osseous abnormality. IMPRESSION: Low lung volume.  No acute cardiopulmonary findings. Electronically  Signed   By: Hart Robinsons M.D.   On: 04/29/2023 10:34    Cardiac Studies:  ECG: SR inferior Q waves slight ST elevation inferior and precordial leads    Telemetry:   NSR  Echo: pending  Medications:    Chlorhexidine Gluconate Cloth  6 each Topical Daily   insulin aspart  0-15 Units Subcutaneous Q4H   insulin glargine-yfgn  25 Units Subcutaneous Daily   warfarin  2.5 mg Oral ONCE-1600   Warfarin - Pharmacist Dosing Inpatient   Does not apply q1600      cefTRIAXone (ROCEPHIN)  IV     dextrose 5% lactated ringers 125 mL/hr at 04/30/23 0715   insulin 2.6 Units/hr (04/30/23 0752)   lactated ringers Stopped (04/30/23 0450)    Assessment/Plan:   SEMI:  Seen by Dr Tresa Endo yesterday Despite troponin 6842 and slight inferior/precordial ST elevation not thought to be a candidate for cath given DKA, lactic acidosis, encephalopathy, DNR and renal failure with Cr  3.31 this am. Has known CAD from cath 07/26/22 Occluded mid LAD and small diffusely dx RCA with occluded distal vessel No targets for revasularization. Admitted with recurrent sepsis ? UTI and DKA. On rocephin and insulin drip BP improved  EF 30-35%  by TTE March 2024 pending this admission Appears to be only on low dose coumadin. Hct falling 24.9 this am with normal PLT 383 Start low dose ASA and coreg. Not a candidate for ACE/ARB/ARNI with significant renal failure   Charlton Haws 04/30/2023, 10:04 AM

## 2023-04-30 NOTE — Progress Notes (Signed)
   04/30/23 1400  Charting Type  Charting Type Full Reassessment Changes Noted  Neurological  Neuro (WDL) X  Orientation Level Oriented to person;Disoriented to place;Disoriented to situation;Disoriented to time  Cognition Poor attention/concentration;Poor judgement;Poor safety awareness;No memory impairment  Speech Expressive aphasia (word salad.)  ECG Monitoring  ECG Heart Rate (!) 108  Neurological  Level of Consciousness Alert     Patient is alert and interacting, compared to this morning's assessment. Word salad noted on exam.

## 2023-04-30 NOTE — Progress Notes (Signed)
Diet advanced to clears, patient pockets jello.   She asked, "who is chewing my food". SLP consult requested.

## 2023-04-30 NOTE — Progress Notes (Signed)
eLink Physician-Brief Progress Note Patient Name: Lauren [name] DOB: 1980-09-15 MRN: [id number]   Date of Service  04/30/2023  HPI/Events of Note  Pain  eICU Interventions     Severe pain due to fall 10/10 Will increase Fentany;     Jeneva Schweizer 04/30/2023, 9:02 PM

## 2023-04-30 NOTE — Progress Notes (Addendum)
Patient tearful, crying, ,hollering through out unit. Marland Kitchenstates left arm hurts 10 out of 10. Weak pulse bilaterally. Left arm warm to touch. Decreased cap refill however able to wiggle left fingers. Calling for her parents as she cries, repeating over and over " daddy, daddy please, help" , she states her arm is broken. Given 12.5 of fentanyl without improvement . She has nothing else ordered. Patient unable to console. Contacted elink MD

## 2023-04-30 NOTE — Plan of Care (Signed)
  Problem: Health Behavior/Discharge Planning: Goal: Ability to manage health-related needs will improve Outcome: Progressing   Problem: Clinical Measurements: Goal: Ability to maintain clinical measurements within normal limits will improve Outcome: Progressing Goal: Will remain free from infection Outcome: Progressing Goal: Diagnostic test results will improve Outcome: Progressing Goal: Respiratory complications will improve Outcome: Progressing Goal: Cardiovascular complication will be avoided Outcome: Progressing   Problem: Nutrition: Goal: Adequate nutrition will be maintained Outcome: Progressing  SLP consult ordered.   Problem: Activity: Goal: Risk for activity intolerance will decrease Outcome: Progressing

## 2023-04-30 NOTE — Progress Notes (Signed)
PHARMACY - ANTICOAGULATION CONSULT NOTE  Pharmacy Consult for Warfarin Indication:  CVA, severe PVD  Allergies  Allergen Reactions   Visipaque [Iodixanol] Nausea And Vomiting    Patient had vagal response during procedure became hypertensive, and bradycardic. CO2 used during procedure prior to contrast being used, this may be cause as well. Recommended premedicating prior to contrast being used in the future.    Wellbutrin [Bupropion] Hives   Gabapentin     dizzy   Cefepime Rash    Tolerates penicilllin   Ciprofloxacin Hcl Hives and Rash    Hives/rash at injection site; 01/15/22 tolerated IV cipro   Tape Rash    Patient Measurements: Height: 5\' 5"  (165.1 cm) Weight: 99.1 kg (218 lb 7.6 oz) IBW/kg (Calculated) : 57  Vital Signs: Temp: 98.5 F (36.9 C) (12/28 0748) Temp Source: Oral (12/28 0748) BP: 128/62 (12/28 0900) Pulse Rate: 101 (12/28 0900)  Labs: Recent Labs    04/29/23 1128 04/29/23 1958 04/29/23 2218 04/30/23 0206 04/30/23 0446  HGB  --   --  8.5*  --  8.3*  HCT  --   --  25.5*  --  24.9*  PLT  --   --  351  --  383  LABPROT  --  77.5*  --   --  29.4*  INR  --  9.6*  --   --  2.8*  CREATININE 3.95* 3.95*  --  3.56* 3.39*  TROPONINIHS 2,646* 6,107* 6,842*  --   --     Estimated Creatinine Clearance: 25.7 mL/min (A) (by C-G formula based on SCr of 3.39 mg/dL (H)).   Medical History: Past Medical History:  Diagnosis Date   Anemia    CAD (coronary artery disease)    a. s/p cath in 03/2014 showing 30% mid-LAD, moderate to severe disease along small D1, patent LCx, moderate to severe distal OM2 stenosis and moderate diffuse diease along RCA not amenable to PCI   CHF (congestive heart failure) (HCC)    a. EF 55-60% in 12/2019 b. EF at 35-40% by echo in 05/2020   CKD (chronic kidney disease) stage 4, GFR 15-29 ml/min (HCC)    Diabetes mellitus without complication (HCC)    Myocardial infarction (HCC)    Neuropathy    PVD (peripheral vascular disease)  (HCC)    Stroke (HCC) 01/2022   L hand weakness    Medications:  Medications Prior to Admission  Medication Sig Dispense Refill Last Dose/Taking   acetaminophen (TYLENOL) 325 MG tablet Take 1-2 tablets (325-650 mg total) by mouth every 4 (four) hours as needed for mild pain. (Patient taking differently: Take 650 mg by mouth as needed for mild pain (pain score 1-3).) 100 tablet 0 04/29/2023   atorvastatin (LIPITOR) 40 MG tablet TAKE 1 TABLET BY MOUTH ONCE DAILY. 90 tablet 1 04/28/2023   clonazePAM (KLONOPIN) 1 MG disintegrating tablet Take 1 tablet (1 mg total) by mouth 2 (two) times daily as needed for seizure (For seizure lasting more than 2 minutes.). 60 tablet 0 Taking As Needed   clopidogrel (PLAVIX) 75 MG tablet Take 1 tablet (75 mg total) by mouth daily. 30 tablet 11 04/28/2023   furosemide (LASIX) 40 MG tablet TAKE 1 TABLET BY MOUTH ONCE DAILY AS NEEDED FOR  SWELLING 30 tablet 0 04/28/2023   hydrALAZINE (APRESOLINE) 25 MG tablet Take 1 tablet (25 mg total) by mouth in the morning and at bedtime.   04/28/2023   ibuprofen (ADVIL) 200 MG tablet Take 800 mg by mouth every  6 (six) hours as needed for moderate pain (pain score 4-6).   04/28/2023   insulin aspart (NOVOLOG) 100 UNIT/ML injection Use with Omnipod for daily dose around 100 units daily (Patient taking differently: Inject 0-200 Units into the skin See admin instructions. Load Omnipod with 200 units every 3-4 days.) 80 mL 3 04/29/2023   isosorbide mononitrate (IMDUR) 60 MG 24 hr tablet TAKE 2 TABLETS BY MOUTH ONCE DAILY. 60 tablet 1 04/28/2023   [EXPIRED] levofloxacin (LEVAQUIN) 500 MG tablet Take 1 tablet (500 mg total) by mouth every other day for 3 doses. 3 tablet 0 04/28/2023   levothyroxine (SYNTHROID) 125 MCG tablet Take 1 tablet (125 mcg total) by mouth daily at 6 (six) AM. 90 tablet 3 04/28/2023   metoprolol succinate (TOPROL-XL) 50 MG 24 hr tablet TAKE 1 TABLET BY MOUTH ONCE DAILY . APPOINTMENT REQUIRED FOR FUTURE REFILLS 15  tablet 0 04/28/2023   Multiple Vitamin (MULTIVITAMIN WITH MINERALS) TABS tablet Take 1 tablet by mouth daily.   Taking   nitroGLYCERIN (NITROSTAT) 0.4 MG SL tablet Place 1 tablet (0.4 mg total) under the tongue every 5 (five) minutes x 3 doses as needed for chest pain. 25 tablet 3 Taking As Needed   oxyCODONE-acetaminophen (PERCOCET) 7.5-325 MG tablet Take 1 tablet by mouth daily as needed for severe pain. 30 tablet 0 Past Week   pantoprazole (PROTONIX) 40 MG tablet Take 1 tablet (40 mg total) by mouth daily. Courtesy fill/ pt to get established with a primary MD for further refills (Patient taking differently: Take 40 mg by mouth daily as needed (reflux).) 90 tablet 1 Past Month   ranolazine (RANEXA) 1000 MG SR tablet TAKE 1  BY MOUTH TWICE DAILY 60 tablet 0 04/28/2023   senna-docusate (SENOKOT-S) 8.6-50 MG tablet Take 1-2 tablets by mouth daily as needed for mild constipation. 30 tablet 0 Taking As Needed   tiZANidine (ZANAFLEX) 4 MG tablet Take 1 tablet (4 mg total) by mouth at bedtime. 30 tablet 1 04/28/2023   topiramate (TOPAMAX) 50 MG tablet Take 1 tablet (50 mg total) by mouth 2 (two) times daily. 60 tablet 3 04/28/2023   traZODone (DESYREL) 100 MG tablet Take 100 mg by mouth at bedtime.   04/28/2023   warfarin (COUMADIN) 5 MG tablet TAKE ONE TABLET BY MOUTH DAILY EXCEPT 1/2 TABLET ON WEDNESDAYS AND SATURDAYS OR AS DIRECTED (Patient taking differently: Take 2.5-5 mg by mouth daily. Take 5mg  (1 tablet) daily, except on Tuesday, Thursday, and Saturday's take 2.5mg  (1/2 tablet).) 40 tablet 5 04/28/2023 at  9:00 PM   Continuous Glucose Sensor (DEXCOM G6 SENSOR) MISC CHANGE SENSOR EVERY 10 DAYS. 3 each 1    Continuous Glucose Transmitter (DEXCOM G6 TRANSMITTER) MISC Change transmitter every 90 days as directed. 1 each 3    Insulin Disposable Pump (OMNIPOD 5 DEXG7G6 PODS GEN 5) MISC Change pod every 48-72 hours. 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 Patient admitted 12/27 with altered mental status in DKA and urinary sepsis. Noted to have elevated INR at 9.6 and oozing from femoral site. Vitamin K 5mg  IV x1 given and thrombi pad placed. Bleeding has resolved and INR down to 2.8. Pharmacy consulted to resume Warfarin therapy.   H/H low-stable. Platelets within normal limits. No further bleeding observed.   PTA Warfarin regimen: 2.5mg  Tuesday/Thursday/Saturday and 5 mg all other days. Last dose 04/28/23 at 9PM.   Goal  of Therapy:  INR 2-3 Monitor platelets by anticoagulation protocol: Yes   Plan:  Restart Warfarin -give 2.5 mg x1 today Monitor daily PT/INR  Link Snuffer, PharmD, BCPS, BCCCP Please refer to [facility] for [facility] Pharmacy numbers 04/30/2023,9:48 AM

## 2023-04-30 NOTE — H&P (Addendum)
NAME:  Lauren [name], MRN:  [id number], DOB:  1981/03/25, LOS: 1 ADMISSION DATE:  04/29/2023, CONSULTATION DATE:  04/29/23 REFERRING MD:  Dr. Denton Lank, CHIEF COMPLAINT:  AMS   History of Present Illness:  Pt encephalopathic, therefore HPI obtained from EMR review.  42yoF with PMH as below significant for type 1 DM (uses insulin pump), seizures, CVA on coumadin, CKD stage 4 . Kown CAD with NSTEMI in July 2024. , CKD with baselin creat 3s. Recently seen in ER for left proximal humerus fx s/p fall 12/23.  Recent admits 12/19- 12/22 for DKA and UTI and 03/2023 with seizure, DKA, and AKI. Then 04/29/2023 - who presented with confusion and weakness from home to [facility].  CBG> 600.  Afebrile and hypotensive in ER.  Found to be in recurrent DKA with bicarb < 7, glucose 832, beta-hydroxybutyric acid > 8, K 6, sCr 3.95, pending CBC and UA Concern for sepsis w/ recent UTI, cultured and started on empiric vanc, flagyl, and aztreonam started.  Insulin gtt started.  CXR without acute process.  .  Remains hypotensive despite 3L LR, starting pressors. Pt to be transferred to [facility] for further ICU care.    Per EDP, verified prior code status with pt and her husband, verified no CPR/ intubation.   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   reports that she quit smoking about 15 months ago. Her smoking use included cigarettes. She started smoking about 16 years ago. She has a 15 pack-year smoking history. She has been exposed to tobacco smoke. She has  never used smokeless tobacco.    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); Irrigation and debridement foot (Left, 09/04/2021); Thrombectomy brachial artery (Right, 11/18/2021); Radiology with anesthesia (N/A, 01/08/2022); IR PERCUTANEOUS ART THROMBECTOMY/INFUSION INTRACRANIAL INC DIAG ANGIO (01/08/2022); IR CT Head Ltd (01/08/2022); IR Intra Cran Stent (01/08/2022); IR ANGIO VERTEBRAL SEL SUBCLAVIAN INNOMINATE UNI L MOD SED (01/18/2022); ABDOMINAL AORTOGRAM W/LOWER EXTREMITY (N/A, 06/10/2022); PERIPHERAL VASCULAR BALLOON ANGIOPLASTY (Left, 06/10/2022); PERIPHERAL VASCULAR INTERVENTION (Left, 06/10/2022); Metatarsal osteotomy (Left, 07/21/2022); and LEFT HEART CATH AND CORONARY ANGIOGRAPHY (N/A, 07/26/2022).   Significant Hospital Events: Including procedures, antibiotic start and stop dates in addition to other pertinent events   JUly 2023 - critical ischemia of LUE EF 40-45% Sept 2023 - Acute R MCA stroke March 2024 - Left diabetic foot with Acute osteomyelitis of fifth metatarsal-s/p excision of left fifth metatarsal and bone biopsy of fourth metatarsal on 3/20  Echocardiogram with EF 30-35% APril 2024 and July 2024 with chest pain due to CAD 03/13/2023 - 03/16/2203 with seizure, DKA, and AKI.- admit 12/19- 04/24/2023 for DKA and UTI - admit 04/25/2023: ER visit for left proximal humerus fracture and discharged on opioid 04/29/2023 - ADMIT DKA. ESTablisedh DNR - "family very vocal about suing hospital if pt is intubated or CPR " -12/27/20244 PM at [facility], ICU in [place]: Daughter is indicated that the insulin pump is working well and sugars were fairly under control on 04/28/2023.  This morning was unsteady and an  hour later was extremely lethargic and taken to the ED.  In the ED apparently patient was hypotensive.  According to daughter patient probably received pressors.  Currently  normotensive and not on pressors.  Patient's confirmed DNR but family worried about ongoing encephalopathy and obtundation.  Patient is afebrile.. RN at the ICU indicate peripheral line sites moves easily [patient is on Plavix and Coumadin. Cardiology consultation for elevated troponin: Not a good cardiac catheterization candidate if troponin increases then consider IV heparin.  Avoid QT prolongation medications.  Hold GDMT with low blood pressure.  Patient's heart grossly volume overloaded. LEFT FEMORAL CVL PLACED IN ER 04/29/23  Interim History / Subjective:    12/28: On room air, on insulin infusion.  Appears to be off Levophed infusion [in fact she was not at the time of arrival to the ICU] creatinine marginally improved.  Insulin infusion rate is at 2.6 units/h [arrival yesterday was on 8 units].  Afebrile.  Is on antibiotics so far culture negative.  This morning more awake but ongoing confusion present according to nursing.  - Has purewick --urine appears discolored.  Mild fold smell in the perineal area present according to nursing. - Trop 2.6K -> 6.8K yesterday; pending today - BHOB - 0.1, gap 7, bic 21 this morning and all acceptable - INR 2.8   Objective   Blood pressure 138/75, pulse (!) 101, temperature 98.5 F (36.9 C), temperature source Oral, resp. rate 19, height 5\' 5"  (1.651 m), weight 99.1 kg, last menstrual period 04/24/2023, SpO2 100%.        Intake/Output Summary (Last 24 hours) at 04/30/2023 0852 Last data filed at 04/30/2023 0727 Gross per 24 hour  Intake 4844.85 ml  Output 1650 ml  Net 3194.85 ml   Filed Weights   04/29/23 0922 04/29/23 1630 04/30/23 0102  Weight: 97.2 kg 101.2 kg 99.1 kg   Examination: General Appearance:  Looks criticall ill OBESE - + Head:  Normocephalic, without obvious abnormality, atraumatic Eyes:  PERRL - yes, conjunctiva/corneas - mudd     Ears:  Normal external ear canals, both ears Nose:  G tube - no Throat:  ETT TUBE - no , OG  tube - no Neck:  Supple,  No enlargement/tenderness/nodules Lungs: Clear to auscultation bilaterally,  Heart:  S1 and S2 normal, no murmur, CVP - no.  Pressors - no Abdomen:  Soft, no masses, no organomegaly Genitalia / Rectal:  -> Foley smell coming from the perineal area.  Has a p.o. weight with discolored urine.  Has a left femoral central line. Extremities:  Extremities- intact,  Skin:  ntact in exposed areas . Sacral area -no decub according to nursing.  Scattered bruises along all extremities. Neurologic:  Sedation - none -> RASS - +1 . Moves all 4s - yes. CAM-ICU -positive for delirium. Orientation -not fully oriented     Resolved Hospital Problem list    Assessment & Plan:   Type 1 DM (uses insulin pump) Recurrent DKA - severe this admit 04/29/2023, Likely UTI mediated  12/28: meets criteria to transition to SQ insuling  Plan - TRansition to SQ insulin per DM coordinator  note  - keep up with BMET schecs + BHOB x3 over next 24h - DM coordinator consult   Suspected Severe sEpsis - ? UTI as primary trigger for UTI  12/28 afebrile. PCT NA. No MRSA PCR since 04/04/23 but at risk. Unclear if urine culture sent  Plan  - resend urine culture - check PRocaol - recheck MRSA PCR  -  vanc 12/27 >>12/27 - Azactiam 12/27 >12/27 - Flagyl 12/27 >>12/27 - CEftrriaxone 12/28  (has tolertaed cepharosposrins well so far) >>>       Shock- at admit into ER with Left femoral CVL placement concern for septic/ hypovolemic, Off pressors when [facility]  12/28 - not on pressors   P:  -Mean arterial pressure goal greater than 65; Levophed if needed or Neo-Synephrine - Do mintenance fluids - RECHCK URINE CULURE   #HFrEF #Prolonged QTc #HTN #Troponin elevation; likely type II -seen by cardiology and recommend heparin only if the troponin goes up further.  Not a cardiac cath candidate #-prior echo 07/2022 showing EF 30-35%, +WMA, indeterminate diastolic, RVSF mildly  reduced, mild to mod MR  12/28 - trop last night 6K  Plan - Track serial troponin - recheck - echo  per cards - GDMT limited but restart per cards   Hx seizures Hx CVA on coumadin Metabolic encephalopathy   04/30/2023:  OBtundation resolved but still confused  P:  - neuro checks - hold topamax, prn klonopin - ativan for seizure activity -Closely monitor - DO NOT INTUBATE if this gets worse (Goals of care)   ANticiagulation on COuimadin due to CVA  12/28 - INR > 2  Plan  - pharmacy coumadin protocol  AKI on CKD 4  - baesline creat probably around 2.7mg % in Juoly 2024 and 2.45 best I dec 2024. Otehrwise mostly 3s  12/28 creat better at 3.4mg %  P:  -Monitor with hydration     Anemia of chronic disease  Plan  - - PRBC for hgb </= 6.9gm%    - exceptions are   -  if ACS susepcted/confirmed then transfuse for hgb </= 8.0gm%,  or    -  active bleeding with hemodynamic instability, then transfuse regardless of hemoglobin value   At at all times try to transfuse 1 unit prbc as possible with exception of active hemorrhage    Protein calorie malnutrition - Prior to & Present on Admit FTT - Prior to & Present on Admit  1228 - stioll confused and cannot eat oral  PLAN - NPO for now but do IV Fluids - ongoing GOC, DNR/ DNI  Best Practice (right click and "Reselect all SmartList Selections" daily)   Diet/type: NPO DVT prophylaxis Coumadin - > check INR Pressure ulcer(s): N/A GI prophylaxis: PPI Lines: N/A Foley:  N/A Code Status:  limited  Last date of multidisciplinary goals of care discussion   - 04/29/2023 IPAL: -no CPR and no intubation but vasopressors and full medical care okay  - 12/28 - Daughter updtaed at bedside. Will get renal US          LABS    PULMONARY Recent Labs  Lab 04/25/23 0856 04/29/23 1701  HCO3 24.8 8.6*  O2SAT 45.1 57.3    CBC Recent Labs  Lab 04/25/23 0856 04/29/23 2218 04/30/23 0446  HGB 10.5* 8.5* 8.3*   HCT 32.0* 25.5* 24.9*  WBC 8.7 16.2* 21.2*  PLT 267 351 383    COAGULATION Recent Labs  Lab 04/24/23 0544 04/29/23 1958 04/30/23 0446  INR 2.4* 9.6* 2.8*    CARDIAC  No results for input(s): "TROPONINI" in the last 168 hours. No results for input(s): "PROBNP" in the last 168 hours.   CHEMISTRY Recent Labs  Lab 04/25/23 0856 04/29/23 1128 04/29/23 1958 04/30/23 0206 04/30/23 0446  NA 134* 124* 131* 135 137  K 3.3* 6.0* 4.1 3.5 3.4*  CL 105 94* 101 106 109  CO2 23 <7* 14* 18* 21*  GLUCOSE 302* 832* 611* 347* 194*  BUN 21* 46* 45* 42* 43*  CREATININE 2.45* 3.95* 3.95* 3.56* 3.39*  CALCIUM 8.0* 7.2* 7.5* 7.7* 7.7*  MG  --   --  1.9  --   --    Estimated Creatinine Clearance: 25.7 mL/min (A) (by C-G formula based on SCr of 3.39 mg/dL (H)).   LIVER Recent Labs  Lab 04/24/23 0544 04/25/23 0856 04/29/23 1128 04/29/23 1958 04/30/23 0446  AST  --  34 25  --  36  ALT  --  34 20  --  24  ALKPHOS  --  80 74  --  80  BILITOT  --  0.5 1.6*  --  0.5  PROT  --  6.1* 4.8*  --  4.6*  ALBUMIN  --  2.8* 2.0*  --  2.0*  INR 2.4*  --   --  9.6* 2.8*     INFECTIOUS Recent Labs  Lab 04/29/23 0937 04/29/23 2218  LATICACIDVEN 3.1* 2.2*     ENDOCRINE CBG (last 3)  Recent Labs    04/30/23 0644 04/30/23 0746 04/30/23 0851  GLUCAP 169* 150* 162*         IMAGING x48h  - image(s) personally visualized  -   highlighted in bold DG Chest Port 1 View Result Date: 04/29/2023 CLINICAL DATA:  Weakness. EXAM: PORTABLE CHEST 1 VIEW COMPARISON:  Chest radiograph dated April 21, 2023. FINDINGS: Low lung volumes with accentuation of the cardiomediastinal silhouette. No focal consolidation. The left costophrenic angle is not completely included within the field of view. No sizable pleural effusion or pneumothorax identified. No acute osseous abnormality. IMPRESSION: Low lung volume.  No acute cardiopulmonary findings. Electronically Signed   By: Hart Robinsons M.D.   On:  04/29/2023 10:34

## 2023-05-01 DIAGNOSIS — N179 Acute kidney failure, unspecified: Secondary | ICD-10-CM | POA: Diagnosis not present

## 2023-05-01 DIAGNOSIS — R6520 Severe sepsis without septic shock: Secondary | ICD-10-CM | POA: Diagnosis not present

## 2023-05-01 DIAGNOSIS — A419 Sepsis, unspecified organism: Secondary | ICD-10-CM | POA: Diagnosis not present

## 2023-05-01 DIAGNOSIS — I214 Non-ST elevation (NSTEMI) myocardial infarction: Secondary | ICD-10-CM | POA: Diagnosis not present

## 2023-05-01 LAB — CBC
HCT: 24.8 % — ABNORMAL LOW (ref 36.0–46.0)
Hemoglobin: 8.2 g/dL — ABNORMAL LOW (ref 12.0–15.0)
MCH: 29.8 pg (ref 26.0–34.0)
MCHC: 33.1 g/dL (ref 30.0–36.0)
MCV: 90.2 fL (ref 80.0–100.0)
Platelets: 385 10*3/uL (ref 150–400)
RBC: 2.75 MIL/uL — ABNORMAL LOW (ref 3.87–5.11)
RDW: 14.2 % (ref 11.5–15.5)
WBC: 14.1 10*3/uL — ABNORMAL HIGH (ref 4.0–10.5)
nRBC: 0 % (ref 0.0–0.2)

## 2023-05-01 LAB — GLUCOSE, CAPILLARY
Glucose-Capillary: 186 mg/dL — ABNORMAL HIGH (ref 70–99)
Glucose-Capillary: 126 mg/dL — ABNORMAL HIGH (ref 70–99)
Glucose-Capillary: 213 mg/dL — ABNORMAL HIGH (ref 70–99)
Glucose-Capillary: 183 mg/dL — ABNORMAL HIGH (ref 70–99)
Glucose-Capillary: 137 mg/dL — ABNORMAL HIGH (ref 70–99)

## 2023-05-01 LAB — BLOOD CULTURE ID PANEL (REFLEXED) - BCID2

## 2023-05-01 LAB — COMPREHENSIVE METABOLIC PANEL
ALT: 21 U/L (ref 0–44)
AST: 28 U/L (ref 15–41)
Albumin: 1.8 g/dL — ABNORMAL LOW (ref 3.5–5.0)
Alkaline Phosphatase: 76 U/L (ref 38–126)
Anion gap: 8 (ref 5–15)
BUN: 34 mg/dL — ABNORMAL HIGH (ref 6–20)
CO2: 18 mmol/L — ABNORMAL LOW (ref 22–32)
Calcium: 7.6 mg/dL — ABNORMAL LOW (ref 8.9–10.3)
Chloride: 111 mmol/L (ref 98–111)
Creatinine, Ser: 2.71 mg/dL — ABNORMAL HIGH (ref 0.44–1.00)
GFR, Estimated: 22 mL/min — ABNORMAL LOW (ref >60)
Glucose, Bld: 189 mg/dL — ABNORMAL HIGH (ref 70–99)
Potassium: 3.3 mmol/L — ABNORMAL LOW (ref 3.5–5.1)
Sodium: 137 mmol/L (ref 135–145)
Total Bilirubin: 0.6 mg/dL (ref <1.2)
Total Protein: 4.4 g/dL — ABNORMAL LOW (ref 6.5–8.1)

## 2023-05-01 LAB — BETA-HYDROXYBUTYRIC ACID: Beta-Hydroxybutyric Acid: 0.82 mmol/L — ABNORMAL HIGH (ref 0.05–0.27)

## 2023-05-01 LAB — PROTIME-INR
INR: 1.3 — ABNORMAL HIGH (ref 0.8–1.2)
Prothrombin Time: 16.4 s — ABNORMAL HIGH (ref 11.4–15.2)

## 2023-05-01 LAB — URINE CULTURE: Culture: <10000 — AB

## 2023-05-01 LAB — PROCALCITONIN: Procalcitonin: 10.76 ng/mL

## 2023-05-01 LAB — PHOSPHORUS: Phosphorus: 2.8 mg/dL (ref 2.5–4.6)

## 2023-05-01 LAB — LACTIC ACID, PLASMA: Lactic Acid, Venous: 1.1 mmol/L (ref 0.5–1.9)

## 2023-05-01 LAB — MAGNESIUM: Magnesium: 2.3 mg/dL (ref 1.7–2.4)

## 2023-05-01 LAB — TROPONIN I (HIGH SENSITIVITY): Troponin I (High Sensitivity): 3931 ng/L (ref <18)

## 2023-05-01 MED ORDER — INSULIN ASPART 100 UNIT/ML IJ SOLN: 0.0000 [IU] | Freq: Every day | INTRAMUSCULAR | Status: DC

## 2023-05-01 MED ORDER — SODIUM CHLORIDE 0.9 % IV BOLUS
250.0000 mL | Freq: Once | INTRAVENOUS | Status: AC
  Administered 2023-05-01: 250 mL via INTRAVENOUS

## 2023-05-01 MED ORDER — WARFARIN SODIUM 7.5 MG PO TABS
7.5000 mg | ORAL_TABLET | Freq: Once | ORAL | Status: AC
  Administered 2023-05-01: 7.5 mg via ORAL
  Filled 2023-05-01: qty 1

## 2023-05-01 MED ORDER — INSULIN ASPART 100 UNIT/ML IJ SOLN
2.0000 [IU] | Freq: Three times a day (TID) | INTRAMUSCULAR | Status: DC
  Administered 2023-05-01 – 2023-05-03 (×6): 2 [IU] via SUBCUTANEOUS

## 2023-05-01 MED ORDER — SODIUM CHLORIDE 0.9 % IV BOLUS
500.0000 mL | Freq: Once | INTRAVENOUS | Status: DC
Start: 2023-05-01 — End: 2023-05-01

## 2023-05-01 MED ORDER — INSULIN ASPART 100 UNIT/ML IJ SOLN
0.0000 [IU] | Freq: Three times a day (TID) | INTRAMUSCULAR | Status: DC
  Administered 2023-05-01: 5 [IU] via SUBCUTANEOUS
  Administered 2023-05-01 – 2023-05-02 (×2): 3 [IU] via SUBCUTANEOUS
  Administered 2023-05-02: 5 [IU] via SUBCUTANEOUS
  Administered 2023-05-03: 2 [IU] via SUBCUTANEOUS

## 2023-05-01 MED ORDER — INSULIN GLARGINE-YFGN 100 UNIT/ML ~~LOC~~ SOLN
30.0000 [IU] | Freq: Every day | SUBCUTANEOUS | Status: DC
  Administered 2023-05-02 – 2023-05-03 (×2): 30 [IU] via SUBCUTANEOUS
  Filled 2023-05-01 (×2): qty 0.3

## 2023-05-01 MED ORDER — FENTANYL CITRATE PF 50 MCG/ML IJ SOSY
12.5000 ug | PREFILLED_SYRINGE | INTRAMUSCULAR | Status: DC | PRN
  Administered 2023-05-01 (×2): 12.5 ug via INTRAVENOUS
  Filled 2023-05-01 (×2): qty 1

## 2023-05-01 MED ORDER — OXYCODONE HCL 5 MG PO TABS
5.0000 mg | ORAL_TABLET | Freq: Four times a day (QID) | ORAL | Status: DC
  Administered 2023-05-01 – 2023-05-02 (×4): 5 mg via ORAL
  Filled 2023-05-01 (×4): qty 1

## 2023-05-01 NOTE — Progress Notes (Signed)
Pt transferred to 6N. V/S/S. Pt's husband Fayrene Fearing notified.

## 2023-05-01 NOTE — Progress Notes (Signed)
PHARMACY - PHYSICIAN COMMUNICATION CRITICAL VALUE ALERT - BLOOD CULTURE IDENTIFICATION (BCID)  Lauren [name] is an 43 y.o. female who presented to [facility] on 04/29/2023   Name of physician (or Provider) Contacted: Dr. Marcelline Deist  Current antibiotics: Zyvox, Ceftriaxone  Changes to prescribed antibiotics recommended:  No changes  Results for orders placed or performed during the hospital encounter of 04/29/23  Blood Culture ID Panel (Reflexed) (Collected: 04/29/2023 11:23 AM)  Result Value Ref Range   Enterococcus faecalis NOT DETECTED NOT DETECTED   Enterococcus Faecium NOT DETECTED NOT DETECTED   Listeria monocytogenes NOT DETECTED NOT DETECTED   Staphylococcus species DETECTED (A) NOT DETECTED   Staphylococcus aureus (BCID) NOT DETECTED NOT DETECTED   Staphylococcus epidermidis DETECTED (A) NOT DETECTED   Staphylococcus lugdunensis NOT DETECTED NOT DETECTED   Streptococcus species NOT DETECTED NOT DETECTED   Streptococcus agalactiae NOT DETECTED NOT DETECTED   Streptococcus pneumoniae NOT DETECTED NOT DETECTED   Streptococcus pyogenes NOT DETECTED NOT DETECTED   A.calcoaceticus-baumannii NOT DETECTED NOT DETECTED   Bacteroides fragilis NOT DETECTED NOT DETECTED   Enterobacterales NOT DETECTED NOT DETECTED   Enterobacter cloacae complex NOT DETECTED NOT DETECTED   Escherichia coli NOT DETECTED NOT DETECTED   Klebsiella aerogenes NOT DETECTED NOT DETECTED   Klebsiella oxytoca NOT DETECTED NOT DETECTED   Klebsiella pneumoniae NOT DETECTED NOT DETECTED   Proteus species NOT DETECTED NOT DETECTED   Salmonella species NOT DETECTED NOT DETECTED   Serratia marcescens NOT DETECTED NOT DETECTED   Haemophilus influenzae NOT DETECTED NOT DETECTED   Neisseria meningitidis NOT DETECTED NOT DETECTED   Pseudomonas aeruginosa NOT DETECTED NOT DETECTED   Stenotrophomonas maltophilia NOT DETECTED NOT DETECTED   Candida albicans NOT DETECTED NOT DETECTED   Candida auris NOT DETECTED NOT  DETECTED   Candida glabrata NOT DETECTED NOT DETECTED   Candida krusei NOT DETECTED NOT DETECTED   Candida parapsilosis NOT DETECTED NOT DETECTED   Candida tropicalis NOT DETECTED NOT DETECTED   Cryptococcus neoformans/gattii NOT DETECTED NOT DETECTED   Methicillin resistance mecA/C DETECTED (A) NOT DETECTED    Abran Duke 05/01/2023  2:16 AM

## 2023-05-01 NOTE — Plan of Care (Signed)
  Problem: Health Behavior/Discharge Planning: Goal: Ability to manage health-related needs will improve Outcome: Progressing   Problem: Clinical Measurements: Goal: Ability to maintain clinical measurements within normal limits will improve Outcome: Progressing Goal: Will remain free from infection Outcome: Progressing Goal: Diagnostic test results will improve Outcome: Progressing   

## 2023-05-01 NOTE — Progress Notes (Addendum)
NAME:  Lauren [name], MRN:  [id number], DOB:  October 23, 1980, LOS: 2 ADMISSION DATE:  04/29/2023, CONSULTATION DATE:  04/29/23 REFERRING MD:  Dr. Denton Lank, CHIEF COMPLAINT:  AMS   History of Present Illness:  Pt encephalopathic, therefore HPI obtained from EMR review.  42yoF with PMH as below significant for type 1 DM (uses insulin pump), seizures, CVA on coumadin, CKD stage 4 . Kown CAD with NSTEMI in July 2024. , CKD with baselin creat 3s. Recently seen in ER for left proximal humerus fx s/p fall 12/23.  Recent admits 12/19- 12/22 for DKA and UTI and 03/2023 with seizure, DKA, and AKI. Then 04/29/2023 - who presented with confusion and weakness from home to [facility].  CBG> 600.  Afebrile and hypotensive in ER.  Found to be in recurrent DKA with bicarb < 7, glucose 832, beta-hydroxybutyric acid > 8, K 6, sCr 3.95, pending CBC and UA Concern for sepsis w/ recent UTI, cultured and started on empiric vanc, flagyl, and aztreonam started.  Insulin gtt started.  CXR without acute process.  .  Remains hypotensive despite 3L LR, starting pressors. Pt to be transferred to [facility] for further ICU care.    Per EDP, verified prior code status with pt and her husband, verified no CPR/ intubation.   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   reports that she quit smoking about 15 months ago. Her smoking use included cigarettes. She started smoking about 16 years ago. She has a 15 pack-year smoking history. She has been exposed to tobacco smoke. She has  never used smokeless tobacco.    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); Irrigation and debridement foot (Left, 09/04/2021); Thrombectomy brachial artery (Right, 11/18/2021); Radiology with anesthesia (N/A, 01/08/2022); IR PERCUTANEOUS ART THROMBECTOMY/INFUSION INTRACRANIAL INC DIAG ANGIO (01/08/2022); IR CT Head Ltd (01/08/2022); IR Intra Cran Stent (01/08/2022); IR ANGIO VERTEBRAL SEL SUBCLAVIAN INNOMINATE UNI L MOD SED (01/18/2022); ABDOMINAL AORTOGRAM W/LOWER EXTREMITY (N/A, 06/10/2022); PERIPHERAL VASCULAR BALLOON ANGIOPLASTY (Left, 06/10/2022); PERIPHERAL VASCULAR INTERVENTION (Left, 06/10/2022); Metatarsal osteotomy (Left, 07/21/2022); and LEFT HEART CATH AND CORONARY ANGIOGRAPHY (N/A, 07/26/2022).   Significant Hospital Events: Including procedures, antibiotic start and stop dates in addition to other pertinent events   JUly 2023 - critical ischemia of LUE EF 40-45% Sept 2023 - Acute R MCA stroke March 2024 - Left diabetic foot with Acute osteomyelitis of fifth metatarsal-s/p excision of left fifth metatarsal and bone biopsy of fourth metatarsal on 3/20  Echocardiogram with EF 30-35% APril 2024 and July 2024 with chest pain due to CAD 03/13/2023 - 03/16/2203 with seizure, DKA, and AKI.- admit 12/19- 04/24/2023 for DKA and UTI - admit 04/25/2023: ER visit for left proximal humerus fracture and discharged on opioid xxxxxxxxxxxxxxxxxxxxxxxxxxxxxxxxxxxxxx 04/29/2023 - ADMIT DKA. ESTablisedh DNR - "family very vocal about suing hospital if pt is intubated or CPR " -12/27/20244 PM at [facility], ICU in [place]: Daughter is indicated that the insulin pump is working well and sugars were fairly under control on 04/28/2023.  This morning was unsteady and  an hour later was extremely lethargic and taken to the ED.  In the ED apparently patient was hypotensive.  According to daughter patient  probably received pressors.  Currently normotensive and not on pressors.  Patient's confirmed DNR but family worried about ongoing encephalopathy and obtundation.  Patient is afebrile.. RN at the ICU indicate peripheral line sites moves easily [patient is on Plavix and Coumadin. Cardiology consultation for elevated troponin: Not a good cardiac catheterization candidate if troponin increases then consider IV heparin.  Avoid QT prolongation medications.  Hold GDMT with low blood pressure.  Patient's heart grossly volume overloaded. LEFT FEMORAL CVL PLACED IN ER 04/29/23 12/28: On room air, on insulin infusion.  Appears to be off Levophed infusion [in fact she was not at the time of arrival to the ICU] creatinine marginally improved.  Insulin infusion rate is at 2.6 units/h [arrival yesterday was on 8 units].  Afebrile.  Is on antibiotics so far culture negative.  This morning more awake but ongoing confusion present according to nursing.  - Has purewick --urine appears discolored.  Mild fold smell in the perineal area present according to nursing. - Trop 2.6K -> 6.8K yesterday; pending today - BHOB - 0.1, gap 7, bic 21 this morning and all acceptable  INR 2.8 CARDS EVAL EF 35% global hypokienss Start low dose ASA and coreg. Not a candidate for ACE/ARB/ARNI with significant renal failure    Interim History / Subjective:   12/29 O n room air. AFebrile. BCID with staph epi. Oheerwise culture neg. CReat improved to 2.7mg . BHOB worse than 24h 0.1 > 0.82. TROP down to 3900. HHuge improivement in mental status. She is hungry and wants to eat  Objective   Blood pressure 138/72, pulse 100, temperature 98.1 F (36.7 C), temperature source Oral, resp. rate 15, height 5\' 5"  (1.651 m), weight 99.5 kg, last menstrual period 04/24/2023, SpO2 100%.        Intake/Output Summary (Last 24 hours) at 05/01/2023 0921 Last data filed at 05/01/2023 0100 Gross per 24 hour  Intake 2852.95 ml  Output 2050 ml  Net  802.95 ml   Filed Weights   04/29/23 1630 04/30/23 0102 05/01/23 0410  Weight: 101.2 kg 99.1 kg 99.5 kg   Examination: General Appearance:  Looks criticall ill OBESE - + Head:  Normocephalic, without obvious abnormality, atraumatic Eyes:  PERRL - yes, conjunctiva/corneas - mudd     Ears:  Normal external ear canals, both ears Nose:  G tube - no Throat:  ETT TUBE - no , OG tube - no Neck:  Supple,  No enlargement/tenderness/nodules Lungs: Clear to auscultation bilaterally,  Heart:  S1 and S2 normal, no murmur, CVP - no.  Pressors - no Abdomen:  Soft, no masses, no organomegaly Genitalia / Rectal:  -> Foley smell coming from the perineal area.  Has a p.o. weight with discolored urine.  Has a left femoral central line. Extremities:  Extremities- intact,  Skin:  ntact in exposed areas . Sacral area -no decub according to nursing.  Scattered bruises along all extremities. Neurologic:  Sedation - none -> RASS - +1 . Moves all 4s - yes. CAM-ICU -positive for delirium. Orientation -not fully oriented     Resolved Hospital Problem list    Assessment & Plan:   Type 1 DM (uses insulin pump) Recurrent DKA - severe this admit 04/29/2023, Likely UTI mediated based on recent hx and PCT  05/01/2023: Doing well on subcutaneous insulin for the last 24 hours.  Bicarb improved but  beta-hydroxybutyrate acid appears to be going up again  Plan -track beta-hydroxybutyrate acid once a day -Discussed with pharmacist to increase insulin regimen  - increase semglee + add meal converage -Will give a 250 mL fluid bolus - SHOULD EAT now - DM coordinator consult   Suspected Severe sEpsis [high procalcitonin and recent admissions]- ? UTI as primary trigger for DKA and renal US at admti 6mm non-obst renal stone  12/29.. :  Still afebrile.  BC ID positive for staph epi x 1 bottle.  Currently on linezolid.  Ongoing ceftriaxone.   Plan -  vanc 12/27 >>12/27 - Azactiam 12/27 >12/27 - Flagyl 12/27  >>12/27 - CEftrriaxone 12/28  (has tolertaed cepharosposrins well so far) >>> -Linezolid 04/30/2023 >>05/01/23.   - await cultures    Shock- at admit into ER with Left femoral CVL placement concern for septic/ hypovolemic, Off pressors [facility]  12/29 - not on pressors   P:  -Mean arterial pressure goal greater than 65; Levophed if needed or Neo-Synephrine    #HFrEF #Prolonged QTc #HTN #Troponin elevation; likely type II -seen by cardiology and recommend heparin only if the troponin goes up further.  Not a cardiac cath candidate #-prior echo 07/2022 showing EF 30-35%, +WMA, indeterminate diastolic, RVSF mildly reduced, mild to mod MR  12/29 -down trend  Plan -per cards   Hx seizures Hx CVA on coumadin Metabolic encephalopathy   04/30/2023:  OBtundation resolved but still confused 12/29/.24 - normal mental status  P:  - hold topamax, prn klonopin - tRH To start 05/02/23 - ativan for seizure activity -Closely monitor - DO NOT INTUBATE if this gets worse (Goals of care)   ANticiagulation on COuimadin due to CVA Sp VIt K on 04/29/23 for INR 9.6  12/28 - INR > 2 12/29 INR 1.3; got coumadin last night  Plan  - pharmacy coumadin protocol  AKI on CKD 4  - baesline creat probably around 2.7mg % in Juoly 2024 and 2.45 best I dec 2024. Otehrwise mostly 3s  Nonobstructur renal stone 6mm at admit  12/29 - creat better  P:  -Monitor with hydration - NEEDS OPD UROLOGY    Anemia of chronic disease  - no bleeding  Plan  - - PRBC for hgb </= 6.9gm%    - exceptions are   -  if ACS susepcted/confirmed then transfuse for hgb </= 8.0gm%,  or    -  active bleeding with hemodynamic instability, then transfuse regardless of hemoglobin value   At at all times try to transfuse 1 unit prbc as possible with exception of active hemorrhage   PAIN LEFT shoulder due to fracture   Plan  - fentanyl 12.56mcg Q2h prn  - cna go to oral 05/02/23  Protein  calorie malnutrition - Prior to & Present on Admit FTT - Prior to & Present on Admit   PLAN - ongoing GOC, DNR/ DNI  Best Practice (right click and "Reselect all SmartList Selections" daily)   Diet/type: Carb modiefied diabet diet DVT prophylaxis [facility]madin - [facility]in [facility] Pressure ulcer(s): N/A GI prophylaxis: PPI Lines: femopral cvl at admit -> dc 05/01/23 Foley:  N/A Code Status:  limited  Last date of multidisciplinary goals of care discussion   - 04/29/2023 IPAL: -no CPR and no intubation but vasopressors and full medical care okay  - 12/28 - Daughter updtaed at bedside.  - 12/29 - husband and daugher at bedside     DISPO - move to tele 05/01/23  and TRiad primary and ccm off 05/02/23     LABS    PULMONARY Recent Labs  Lab 04/25/23 0856 04/29/23 1701  HCO3 24.8 8.6*  O2SAT 45.1 57.3    CBC Recent Labs  Lab 04/29/23 2218 04/30/23 0446 05/01/23 0359  HGB 8.5* 8.3* 8.2*  HCT 25.5* 24.9* 24.8*  WBC 16.2* 21.2* 14.1*  PLT 351 383 385    COAGULATION Recent Labs  Lab 04/29/23 1958 04/30/23 0446 05/01/23 0520  INR 9.6* 2.8* 1.3*    CARDIAC  No results for input(s): "TROPONINI" in the last 168 hours. No results for input(s): "PROBNP" in the last 168 hours.   CHEMISTRY Recent Labs  Lab 04/29/23 1958 04/30/23 0206 04/30/23 0446 04/30/23 0930 05/01/23 0359  NA 131* 135 137 135 137  K 4.1 3.5 3.4* 3.4* 3.3*  CL 101 106 109 108 111  CO2 14* 18* 21* 17* 18*  GLUCOSE 611* 347* 194* 169* 189*  BUN 45* 42* 43* 44* 34*  CREATININE 3.95* 3.56* 3.39* 3.31* 2.71*  CALCIUM 7.5* 7.7* 7.7* 7.6* 7.6*  MG 1.9  --   --  1.8 2.3  PHOS  --   --   --  3.3 2.8   Estimated Creatinine Clearance: 32.2 mL/min (A) (by C-G formula based on SCr of 2.71 mg/dL (H)).   LIVER Recent Labs  Lab 04/25/23 0856 04/29/23 1128 04/29/23 1958 04/30/23 0446 05/01/23 0359 05/01/23 0520  AST 34 25  --  36 28  --   ALT 34 20  --  24 21  --    ALKPHOS 80 74  --  80 76  --   BILITOT 0.5 1.6*  --  0.5 0.6  --   PROT 6.1* 4.8*  --  4.6* 4.4*  --   ALBUMIN 2.8* 2.0*  --  2.0* 1.8*  --   INR  --   --  9.6* 2.8*  --  1.3*     INFECTIOUS Recent Labs  Lab 04/29/23 2218 04/30/23 0930 05/01/23 0359  LATICACIDVEN 2.2* 1.5 1.1  PROCALCITON  --  12.53 10.76     ENDOCRINE CBG (last 3)  Recent Labs    04/30/23 2343 05/01/23 0332 05/01/23 0726  GLUCAP 198* 186* 126*         IMAGING x48h  - image(s) personally visualized  -   highlighted in bold US RENAL Result Date: 04/30/2023 CLINICAL DATA:  Acute on chronic renal failure EXAM: RENAL / URINARY TRACT ULTRASOUND COMPLETE COMPARISON:  01/10/2022, 08/03/2022 FINDINGS: Right Kidney: Renal measurements: 9.1 x 4.9 x 6.0 cm = volume: 138.4 mL. Normal renal echotexture. Nonobstructing 6 mm calculus lower pole right kidney. No hydronephrosis. Left Kidney: Not visualized due to overlying bowel gas. Bladder: Appears normal for degree of bladder distention. Other: None. IMPRESSION: 1. Nonvisualization of the left kidney due to poor acoustic window and overlying bowel gas. 2. Nonobstructing 6 mm right renal calculus. Otherwise unremarkable right kidney. Electronically Signed   By: Sharlet Salina M.D.   On: 04/30/2023 17:25   ECHOCARDIOGRAM COMPLETE Result Date: 04/30/2023    ECHOCARDIOGRAM REPORT   Patient Name:   Lauren [name] Date of Exam: 04/30/2023 Medical Rec #:  [id number]       Height:       65.0 in Accession #:    [id number]      Weight:       218.5 lb Date of Birth:  [birth year]       BSA:  2.054 m Patient Age:    42 years        BP:           134/73 mmHg Patient Gender: F               HR:           108 bpm. Exam Location:  Inpatient Procedure: 2D Echo, Color Doppler, Cardiac Doppler and Intracardiac            Opacification Agent Indications:    Elevated troponin  History:        Patient has prior history of Echocardiogram examinations, most                 recent  07/24/2022.  Sonographer:    Harriette Bouillon RDCS Referring Phys: [id number] Jonita Albee IMPRESSIONS  1. Left ventricular ejection fraction, by estimation, is 30 to 35%. The left ventricle has moderately decreased function. The left ventricle demonstrates global hypokinesis. The left ventricular internal cavity size was moderately dilated. Left ventricular diastolic parameters are indeterminate.  2. Right ventricular systolic function is normal. The right ventricular size is normal.  3. Left atrial size was mildly dilated.  4. The mitral valve is normal in structure. Trivial mitral valve regurgitation. No evidence of mitral stenosis.  5. The aortic valve is tricuspid. Aortic valve regurgitation is not visualized. No aortic stenosis is present.  6. The inferior vena cava is normal in size with greater than 50% respiratory variability, suggesting right atrial pressure of 3 mmHg. FINDINGS  Left Ventricle: Left ventricular ejection fraction, by estimation, is 30 to 35%. The left ventricle has moderately decreased function. The left ventricle demonstrates global hypokinesis. Definity contrast agent was given IV to delineate the left ventricular endocardial borders. The left ventricular internal cavity size was moderately dilated. There is no left ventricular hypertrophy. Left ventricular diastolic parameters are indeterminate. Right Ventricle: The right ventricular size is normal. No increase in right ventricular wall thickness. Right ventricular systolic function is normal. Left Atrium: Left atrial size was mildly dilated. Right Atrium: Right atrial size was normal in size. Pericardium: Trivial pericardial effusion is present. The pericardial effusion is posterior to the left ventricle. Mitral Valve: The mitral valve is normal in structure. Trivial mitral valve regurgitation. No evidence of mitral valve stenosis. Tricuspid Valve: The tricuspid valve is normal in structure. Tricuspid valve regurgitation is mild . No  evidence of tricuspid stenosis. Aortic Valve: The aortic valve is tricuspid. Aortic valve regurgitation is not visualized. No aortic stenosis is present. Pulmonic Valve: The pulmonic valve was normal in structure. Pulmonic valve regurgitation is not visualized. No evidence of pulmonic stenosis. Aorta: The aortic root is normal in size and structure. Venous: The inferior vena cava is normal in size with greater than 50% respiratory variability, suggesting right atrial pressure of 3 mmHg. IAS/Shunts: No atrial level shunt detected by color flow Doppler.  LEFT VENTRICLE PLAX 2D LVIDd:         5.30 cm      Diastology LVIDs:         4.00 cm      LV e' lateral: 11.50 cm/s LV PW:         1.00 cm LV IVS:        1.00 cm LVOT diam:     1.70 cm LV SV:         35 LV SV Index:   17 LVOT Area:     2.27 cm  LV Volumes (MOD)  LV vol d, MOD A2C: 186.0 ml LV vol d, MOD A4C: 252.0 ml LV vol s, MOD A2C: 96.3 ml LV vol s, MOD A4C: 161.0 ml LV SV MOD A2C:     89.7 ml LV SV MOD A4C:     252.0 ml LV SV MOD BP:      92.2 ml LEFT ATRIUM             Index LA diam:        3.90 cm 1.90 cm/m LA Vol (A2C):   24.1 ml 11.73 ml/m LA Vol (A4C):   52.9 ml 25.76 ml/m LA Biplane Vol: 37.3 ml 18.16 ml/m  AORTIC VALVE LVOT Vmax:   85.40 cm/s LVOT Vmean:  55.200 cm/s LVOT VTI:    0.155 m  AORTA Ao Root diam: 3.10 cm Ao Asc diam:  2.70 cm  SHUNTS Systemic VTI:  0.16 m Systemic Diam: 1.70 cm Charlton Haws MD Electronically signed by Charlton Haws MD Signature Date/Time: 04/30/2023/2:20:11 PM    Final    DG Chest Port 1 View Result Date: 04/29/2023 CLINICAL DATA:  Weakness. EXAM: PORTABLE CHEST 1 VIEW COMPARISON:  Chest radiograph dated April 21, 2023. FINDINGS: Low lung volumes with accentuation of the cardiomediastinal silhouette. No focal consolidation. The left costophrenic angle is not completely included within the field of view. No sizable pleural effusion or pneumothorax identified. No acute osseous abnormality. IMPRESSION: Low lung volume.   No acute cardiopulmonary findings. Electronically Signed   By: Hart Robinsons M.D.   On: 04/29/2023 10:34

## 2023-05-01 NOTE — Progress Notes (Signed)
° °  Got a roiom in floor Dr Dayna Barker agreed for triad pick up RN says fentanyl causing fast onset and then quick wean off andpatient asking repeatedly for pain control  Plan  Dc fent prn Do oxy 5mg  Q6h prn with hold paramenters     SIGNATURE    Dr. Kalman Shan, M.D., F.C.C.P,  Pulmonary and Critical Care Medicine Staff Physician, [facility] Center Director - Interstitial Lung Disease  Program  Pulmonary Fibrosis [facility] at [facility] [place], [place], [address]   Pager: [phone], If no answer  -> Check AMION or Try [phone] Telephone (clinical office): [phone] Telephone (research): [phone]  4:16 PM 05/01/2023

## 2023-05-01 NOTE — Evaluation (Signed)
Clinical/Bedside Swallow Evaluation Patient Details  Name: Lauren [name] MRN: [id number] Date of Birth: 1980-10-29  Today's Date: 05/01/2023 Time: SLP Start Time (ACUTE ONLY): 0906 SLP Stop Time (ACUTE ONLY): 0914 SLP Time Calculation (min) (ACUTE ONLY): 8 min  Past Medical History:  Past Medical History:  Diagnosis Date   Anemia    CAD (coronary artery disease)    a. s/p cath in 03/2014 showing 30% mid-LAD, moderate to severe disease along small D1, patent LCx, moderate to severe distal OM2 stenosis and moderate diffuse diease along RCA not amenable to PCI   CHF (congestive heart failure) (HCC)    a. EF 55-60% in 12/2019 b. EF at 35-40% by echo in 05/2020   CKD (chronic kidney disease) stage 4, GFR 15-29 ml/min (HCC)    Diabetes mellitus without complication (HCC)    Myocardial infarction (HCC)    Neuropathy    PVD (peripheral vascular disease) (HCC)    Stroke (HCC) 01/2022   L hand weakness   Past Surgical History:  Past Surgical History:  Procedure Laterality Date   ABDOMINAL AORTOGRAM W/LOWER EXTREMITY N/A 06/10/2022   Procedure: ABDOMINAL AORTOGRAM W/LOWER EXTREMITY;  Surgeon: Cephus Shelling, MD;  Location: [facility];  Service: Cardiovascular;  Laterality: N/A;   AMPUTATION Left 09/02/2021   Procedure: AMPUTATION RAY;  Surgeon: Edwin Cap, DPM;  Location: [facility] OR;  Service: Podiatry;  Laterality: Left;  sagittal saw, 3L bag saline & Pulse   Cardiac catherization     CHOLECYSTECTOMY     I & D EXTREMITY Left 08/30/2021   Procedure: IRRIGATION AND DEBRIDEMENT WITH BONE BIOPSY;  Surgeon: Candelaria Stagers, DPM;  Location: [facility] OR;  Service: Podiatry;  Laterality: Left;   IR ANGIO VERTEBRAL SEL SUBCLAVIAN INNOMINATE UNI L MOD SED  01/18/2022   IR CT HEAD LTD  01/08/2022   IR INTRA CRAN STENT  01/08/2022   IR PERCUTANEOUS ART THROMBECTOMY/INFUSION INTRACRANIAL INC DIAG ANGIO  01/08/2022   IRRIGATION AND DEBRIDEMENT FOOT Left 09/04/2021   Procedure: IRRIGATION AND DEBRIDEMENT  FOOT AND CLOSURE;  Surgeon: Edwin Cap, DPM;  Location: [facility];  Service: Podiatry;  Laterality: Left;   LEFT HEART CATH AND CORONARY ANGIOGRAPHY N/A 07/26/2022   Procedure: LEFT HEART CATH AND CORONARY ANGIOGRAPHY;  Surgeon: [place], Peter M, MD;  Location: [facility] INVASIVE CV LAB;  Service: Cardiovascular;  Laterality: N/A;   METATARSAL OSTEOTOMY Left 07/21/2022   Procedure: LEFT FOOT EXCISION OF FIFTH METATARSAL WITH DEBRIDEMENT OF ULCER, POSSIBLE BONE BIOPSY OF THE FOURTH METATARSAL, POSSIBLE FOURTH RAY AMPUTATION OF LEFT TOEAND METATARSAL;  Surgeon: Vivi Barrack, DPM;  Location: [facility] OR;  Service: Podiatry;  Laterality: Left;   PERIPHERAL VASCULAR BALLOON ANGIOPLASTY Left 06/10/2022   Procedure: PERIPHERAL VASCULAR BALLOON ANGIOPLASTY;  Surgeon: Cephus Shelling, MD;  Location: [facility];  Service: Cardiovascular;  Laterality: Left;  AT and DP   PERIPHERAL VASCULAR INTERVENTION Left 06/10/2022   Procedure: PERIPHERAL VASCULAR INTERVENTION;  Surgeon: Cephus Shelling, MD;  Location: [facility] INVASIVE CV LAB;  Service: Cardiovascular;  Laterality: Left;  SFA   RADIOLOGY WITH ANESTHESIA N/A 01/08/2022   Procedure: IR WITH ANESTHESIA;  Surgeon: Radiologist, Medication, MD;  Location: [facility];  Service: Radiology;  Laterality: N/A;   THROMBECTOMY BRACHIAL ARTERY Right 11/18/2021   Procedure: RIGHT BRACHIAL, RADIAL, & ULNAR ARTERY THROMBECTOMY.;  Surgeon: Cephus Shelling, MD;  Location: [facility] OR;  Service: Vascular;  Laterality: Right;   TUBAL LIGATION     HPI:  Lauren [name] is  a 42 yo female presenting to ED 12/27 with confusion and L sided weakness from home to [facility]. Afebrile and hypotensive in ED, found to be in recurrent DKA with concern for sepsis. Recently admitted 12/19-12/22 for DKA and UTI and 03/2023 with seizure, DKA, AKI. Seen by SLP following CVA in 2023 with recommendations for a Dys 2/nectar thick liquid diet prior to upgrade to Dys 3/thin liquid diet before continuing SLP  intervention in AIR for dysphagia and cognitive-linguistic goals. 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 Oral motor exam WFL. She self-fed trials of thin liquids, purees, and solids without overt s/s of dysphagia or aspiration. Her mentation appears improved today and she is eager to advance diet. Recommend regular solids with thin liquids and no SLP f/u. SLP Visit Diagnosis: Dysphagia, unspecified (R13.10)    Aspiration Risk  Mild aspiration risk    Diet Recommendation Regular;Thin liquid    Liquid Administration via: Cup;Straw Medication Administration: Whole meds with liquid Supervision: Patient able to self feed Compensations: Slow rate;Small sips/bites Postural Changes: Seated upright at 90 degrees    Other  Recommendations Oral Care Recommendations: Oral care BID    Recommendations for follow up therapy are one component of a multi-disciplinary discharge planning process, led by the attending physician.  Recommendations may be updated based on patient status, additional functional criteria and insurance authorization.  Follow up Recommendations No SLP follow up      Assistance Recommended at Discharge    Functional Status Assessment Patient has not had a recent decline in their functional status  Frequency and Duration            Prognosis        Swallow Study   General HPI: Lauren [name] is a 42 yo female presenting to ED 12/27 with confusion and L sided weakness from home to [facility]. Afebrile and hypotensive in ED, found to be in recurrent DKA with concern for sepsis. Recently admitted 12/19-12/22 for DKA and UTI and 03/2023 with seizure, DKA, AKI. Seen by SLP following CVA in 2023 with recommendations for a Dys 2/nectar thick liquid diet prior to upgrade to Dys 3/thin liquid diet  before continuing SLP intervention in AIR for dysphagia and cognitive-linguistic goals. 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 Type of Study: Bedside Swallow Evaluation Previous Swallow Assessment: see HPI Diet Prior to this Study: Clear liquid diet Temperature Spikes Noted: No Respiratory Status: Room air History of Recent Intubation: No Behavior/Cognition: Alert;Cooperative;Pleasant mood Oral Cavity Assessment: Within Functional Limits Oral Care Completed by SLP: No Oral Cavity - Dentition: Adequate natural dentition Vision: Functional for self-feeding Self-Feeding Abilities: Able to feed self Patient Positioning: Upright in bed Baseline Vocal Quality: Normal Volitional Cough: Strong Volitional Swallow: Able to elicit    Oral/Motor/Sensory Function Overall Oral Motor/Sensory Function: Within functional limits   Ice Chips Ice chips: Not tested   Thin Liquid Thin Liquid: Within functional limits Presentation: Straw;Self Fed    Nectar Thick Nectar Thick Liquid: Not tested   Honey Thick Honey Thick Liquid: Not tested   Puree Puree: Within functional limits Presentation: Spoon;Self Fed   Solid     Solid: Within functional limits Presentation: Self Fed      Gwynneth Aliment, M.A., CF-SLP Speech Language Pathology, Acute Rehabilitation Services  Secure Chat preferred [phone]  05/01/2023,9:21  AM

## 2023-05-01 NOTE — Progress Notes (Signed)
PHARMACY - ANTICOAGULATION CONSULT NOTE  Pharmacy Consult for Warfarin Indication:  CVA, severe PVD  Allergies  Allergen Reactions   Visipaque [Iodixanol] Nausea And Vomiting    Patient had vagal response during procedure became hypertensive, and bradycardic. CO2 used during procedure prior to contrast being used, this may be cause as well. Recommended premedicating prior to contrast being used in the future.    Wellbutrin [Bupropion] Hives   Gabapentin     dizzy   Cefepime Rash    Tolerates penicilllin   Ciprofloxacin Hcl Hives and Rash    Hives/rash at injection site; 01/15/22 tolerated IV cipro   Tape Rash    Patient Measurements: Height: 5\' 5"  (165.1 cm) Weight: 99.5 kg (219 lb 5.7 oz) IBW/kg (Calculated) : 57  Vital Signs: Temp: 98.1 F (36.7 C) (12/29 0729) Temp Source: Oral (12/29 0729) BP: 138/72 (12/29 0900) Pulse Rate: 100 (12/29 0900)  Labs: Recent Labs    04/29/23 1958 04/29/23 1958 04/29/23 2218 04/30/23 0206 04/30/23 0446 04/30/23 0930 05/01/23 0359 05/01/23 0520  HGB  --    < > 8.5*  --  8.3*  --  8.2*  --   HCT  --   --  25.5*  --  24.9*  --  24.8*  --   PLT  --   --  351  --  383  --  385  --   LABPROT 77.5*  --   --   --  29.4*  --   --  16.4*  INR 9.6*  --   --   --  2.8*  --   --  1.3*  CREATININE 3.95*  --   --    < > 3.39* 3.31* 2.71*  --   TROPONINIHS 6,107*  --  8,295*  --   --  7,045* 3,931*  --    < > = values in this interval not displayed.    Estimated Creatinine Clearance: 32.2 mL/min (A) (by C-G formula based on SCr of 2.71 mg/dL (H)).   Medical History: Past Medical History:  Diagnosis Date   Anemia    CAD (coronary artery disease)    a. s/p cath in 03/2014 showing 30% mid-LAD, moderate to severe disease along small D1, patent LCx, moderate to severe distal OM2 stenosis and moderate diffuse diease along RCA not amenable to PCI   CHF (congestive heart failure) (HCC)    a. EF 55-60% in 12/2019 b. EF at 35-40% by echo in 05/2020    CKD (chronic kidney disease) stage 4, GFR 15-29 ml/min (HCC)    Diabetes mellitus without complication (HCC)    Myocardial infarction (HCC)    Neuropathy    PVD (peripheral vascular disease) (HCC)    Stroke (HCC) 01/2022   L hand weakness    Medications:  Medications Prior to Admission  Medication Sig Dispense Refill Last Dose/Taking   acetaminophen (TYLENOL) 325 MG tablet Take 1-2 tablets (325-650 mg total) by mouth every 4 (four) hours as needed for mild pain. (Patient taking differently: Take 650 mg by mouth as needed for mild pain (pain score 1-3).) 100 tablet 0 04/29/2023   atorvastatin (LIPITOR) 40 MG tablet TAKE 1 TABLET BY MOUTH ONCE DAILY. 90 tablet 1 04/28/2023   clonazePAM (KLONOPIN) 1 MG disintegrating tablet Take 1 tablet (1 mg total) by mouth 2 (two) times daily as needed for seizure (For seizure lasting more than 2 minutes.). 60 tablet 0 Taking As Needed   clopidogrel (PLAVIX) 75 MG tablet Take 1 tablet (  75 mg total) by mouth daily. 30 tablet 11 04/28/2023   furosemide (LASIX) 40 MG tablet TAKE 1 TABLET BY MOUTH ONCE DAILY AS NEEDED FOR  SWELLING 30 tablet 0 04/28/2023   hydrALAZINE (APRESOLINE) 25 MG tablet Take 1 tablet (25 mg total) by mouth in the morning and at bedtime.   04/28/2023   ibuprofen (ADVIL) 200 MG tablet Take 800 mg by mouth every 6 (six) hours as needed for moderate pain (pain score 4-6).   04/28/2023   insulin aspart (NOVOLOG) 100 UNIT/ML injection Use with Omnipod for daily dose around 100 units daily (Patient taking differently: Inject 0-200 Units into the skin See admin instructions. Load Omnipod with 200 units every 3-4 days.) 80 mL 3 04/29/2023   isosorbide mononitrate (IMDUR) 60 MG 24 hr tablet TAKE 2 TABLETS BY MOUTH ONCE DAILY. 60 tablet 1 04/28/2023   [EXPIRED] levofloxacin (LEVAQUIN) 500 MG tablet Take 1 tablet (500 mg total) by mouth every other day for 3 doses. 3 tablet 0 04/28/2023   levothyroxine (SYNTHROID) 125 MCG tablet Take 1 tablet (125  mcg total) by mouth daily at 6 (six) AM. 90 tablet 3 04/28/2023   metoprolol succinate (TOPROL-XL) 50 MG 24 hr tablet TAKE 1 TABLET BY MOUTH ONCE DAILY . APPOINTMENT REQUIRED FOR FUTURE REFILLS 15 tablet 0 04/28/2023   Multiple Vitamin (MULTIVITAMIN WITH MINERALS) TABS tablet Take 1 tablet by mouth daily.   Taking   nitroGLYCERIN (NITROSTAT) 0.4 MG SL tablet Place 1 tablet (0.4 mg total) under the tongue every 5 (five) minutes x 3 doses as needed for chest pain. 25 tablet 3 Taking As Needed   oxyCODONE-acetaminophen (PERCOCET) 7.5-325 MG tablet Take 1 tablet by mouth daily as needed for severe pain. 30 tablet 0 Past Week   pantoprazole (PROTONIX) 40 MG tablet Take 1 tablet (40 mg total) by mouth daily. Courtesy fill/ pt to get established with a primary MD for further refills (Patient taking differently: Take 40 mg by mouth daily as needed (reflux).) 90 tablet 1 Past Month   ranolazine (RANEXA) 1000 MG SR tablet TAKE 1  BY MOUTH TWICE DAILY 60 tablet 0 04/28/2023   senna-docusate (SENOKOT-S) 8.6-50 MG tablet Take 1-2 tablets by mouth daily as needed for mild constipation. 30 tablet 0 Taking As Needed   tiZANidine (ZANAFLEX) 4 MG tablet Take 1 tablet (4 mg total) by mouth at bedtime. 30 tablet 1 04/28/2023   topiramate (TOPAMAX) 50 MG tablet Take 1 tablet (50 mg total) by mouth 2 (two) times daily. 60 tablet 3 04/28/2023   traZODone (DESYREL) 100 MG tablet Take 100 mg by mouth at bedtime.   04/28/2023   warfarin (COUMADIN) 5 MG tablet TAKE ONE TABLET BY MOUTH DAILY EXCEPT 1/2 TABLET ON WEDNESDAYS AND SATURDAYS OR AS DIRECTED (Patient taking differently: Take 2.5-5 mg by mouth daily. Take 5mg  (1 tablet) daily, except on Tuesday, Thursday, and Saturday's take 2.5mg  (1/2 tablet).) 40 tablet 5 04/28/2023 at  9:00 PM   Continuous Glucose Sensor (DEXCOM G6 SENSOR) MISC CHANGE SENSOR EVERY 10 DAYS. 3 each 1    Continuous Glucose Transmitter (DEXCOM G6 TRANSMITTER) MISC Change transmitter every 90 days as  directed. 1 each 3    Insulin Disposable Pump (OMNIPOD 5 DEXG7G6 PODS GEN 5) MISC Change pod every 48-72 hours. 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 Patient admitted 12/27 with altered mental status in DKA and urinary  sepsis. Noted to have elevated INR at 9.6 and oozing from femoral site. Vitamin K 5mg  IV x1 given and thrombi pad placed. Bleeding has resolved and INR down to 2.8. Pharmacy consulted to resume Warfarin therapy.   INR now down to 1.3 H/H low-stable. Platelets within normal limits. No further bleeding observed.   PTA Warfarin regimen: 2.5mg  Tuesday/Thursday/Saturday and 5 mg all other days. Last dose 04/28/23 at 9PM.   Goal of Therapy:  INR 2-3 Monitor platelets by anticoagulation protocol: Yes   Plan:  Warfarin 7.5mg  po x1 tonight (increased dose due to vit k resistance) Monitor daily PT/INR  Link Snuffer, PharmD, BCPS, BCCCP Please refer to [facility] for [facility] Pharmacy numbers 05/01/2023,9:52 AM

## 2023-05-01 NOTE — Progress Notes (Signed)
Cardiologist:  Dominga Ferry  Subjective:   Teery eyed "My body is tired" Family in room No chest pain   Objective:  Vitals:   05/01/23 0729 05/01/23 0740 05/01/23 0800 05/01/23 0900  BP:  (!) 129/95 (!) 155/94 138/72  Pulse:  97 98 100  Resp:   14 15  Temp: 98.1 F (36.7 C)     TempSrc: Oral     SpO2:   100% 100%  Weight:      Height:        Intake/Output from previous day:  Intake/Output Summary (Last 24 hours) at 05/01/2023 1011 Last data filed at 05/01/2023 0100 Gross per 24 hour  Intake 2852.95 ml  Output 2050 ml  Net 802.95 ml    Physical Exam:  Obese female Recent left humeral fracture No murmur  Lungs clear Triple lumen left femoral vein Plus one bilateral edema Amputation left 5 th toe   Lab Results: Basic Metabolic Panel: Recent Labs    04/30/23 0930 05/01/23 0359  NA 135 137  K 3.4* 3.3*  CL 108 111  CO2 17* 18*  GLUCOSE 169* 189*  BUN 44* 34*  CREATININE 3.31* 2.71*  CALCIUM 7.6* 7.6*  MG 1.8 2.3  PHOS 3.3 2.8   Liver Function Tests: Recent Labs    04/30/23 0446 05/01/23 0359  AST 36 28  ALT 24 21  ALKPHOS 80 76  BILITOT 0.5 0.6  PROT 4.6* 4.4*  ALBUMIN 2.0* 1.8*   No results for input(s): "LIPASE", "AMYLASE" in the last 72 hours. CBC: Recent Labs    04/29/23 2218 04/30/23 0446 05/01/23 0359  WBC 16.2* 21.2* 14.1*  NEUTROABS 14.2*  --   --   HGB 8.5* 8.3* 8.2*  HCT 25.5* 24.9* 24.8*  MCV 89.8 89.2 90.2  PLT 351 383 385     Imaging: US RENAL Result Date: 04/30/2023 CLINICAL DATA:  Acute on chronic renal failure EXAM: RENAL / URINARY TRACT ULTRASOUND COMPLETE COMPARISON:  01/10/2022, 08/03/2022 FINDINGS: Right Kidney: Renal measurements: 9.1 x 4.9 x 6.0 cm = volume: 138.4 mL. Normal renal echotexture. Nonobstructing 6 mm calculus lower pole right kidney. No hydronephrosis. Left Kidney: Not visualized due to overlying bowel gas. Bladder: Appears normal for degree of bladder distention. Other: None. IMPRESSION: 1.  Nonvisualization of the left kidney due to poor acoustic window and overlying bowel gas. 2. Nonobstructing 6 mm right renal calculus. Otherwise unremarkable right kidney. Electronically Signed   By: Sharlet Salina M.D.   On: 04/30/2023 17:25   ECHOCARDIOGRAM COMPLETE Result Date: 04/30/2023    ECHOCARDIOGRAM REPORT   Patient Name:   Lauren [name] Date of Exam: 04/30/2023 Medical Rec #:  [id number]       Height:       65.0 in Accession #:    [id number]      Weight:       218.5 lb Date of Birth:  30-Mar-1981       BSA:          2.054 m Patient Age:    42 years        BP:           134/73 mmHg Patient Gender: F               HR:           108 bpm. Exam Location:  Inpatient Procedure: 2D Echo, Color Doppler, Cardiac Doppler and Intracardiac            Opacification Agent Indications:  Elevated troponin  History:        Patient has prior history of Echocardiogram examinations, most                 recent 07/24/2022.  Sonographer:    Harriette Bouillon RDCS Referring Phys: [id number] Jonita Albee IMPRESSIONS  1. Left ventricular ejection fraction, by estimation, is 30 to 35%. The left ventricle has moderately decreased function. The left ventricle demonstrates global hypokinesis. The left ventricular internal cavity size was moderately dilated. Left ventricular diastolic parameters are indeterminate.  2. Right ventricular systolic function is normal. The right ventricular size is normal.  3. Left atrial size was mildly dilated.  4. The mitral valve is normal in structure. Trivial mitral valve regurgitation. No evidence of mitral stenosis.  5. The aortic valve is tricuspid. Aortic valve regurgitation is not visualized. No aortic stenosis is present.  6. The inferior vena cava is normal in size with greater than 50% respiratory variability, suggesting right atrial pressure of 3 mmHg. FINDINGS  Left Ventricle: Left ventricular ejection fraction, by estimation, is 30 to 35%. The left ventricle has moderately decreased  function. The left ventricle demonstrates global hypokinesis. Definity contrast agent was given IV to delineate the left ventricular endocardial borders. The left ventricular internal cavity size was moderately dilated. There is no left ventricular hypertrophy. Left ventricular diastolic parameters are indeterminate. Right Ventricle: The right ventricular size is normal. No increase in right ventricular wall thickness. Right ventricular systolic function is normal. Left Atrium: Left atrial size was mildly dilated. Right Atrium: Right atrial size was normal in size. Pericardium: Trivial pericardial effusion is present. The pericardial effusion is posterior to the left ventricle. Mitral Valve: The mitral valve is normal in structure. Trivial mitral valve regurgitation. No evidence of mitral valve stenosis. Tricuspid Valve: The tricuspid valve is normal in structure. Tricuspid valve regurgitation is mild . No evidence of tricuspid stenosis. Aortic Valve: The aortic valve is tricuspid. Aortic valve regurgitation is not visualized. No aortic stenosis is present. Pulmonic Valve: The pulmonic valve was normal in structure. Pulmonic valve regurgitation is not visualized. No evidence of pulmonic stenosis. Aorta: The aortic root is normal in size and structure. Venous: The inferior vena cava is normal in size with greater than 50% respiratory variability, suggesting right atrial pressure of 3 mmHg. IAS/Shunts: No atrial level shunt detected by color flow Doppler.  LEFT VENTRICLE PLAX 2D LVIDd:         5.30 cm      Diastology LVIDs:         4.00 cm      LV e' lateral: 11.50 cm/s LV PW:         1.00 cm LV IVS:        1.00 cm LVOT diam:     1.70 cm LV SV:         35 LV SV Index:   17 LVOT Area:     2.27 cm  LV Volumes (MOD) LV vol d, MOD A2C: 186.0 ml LV vol d, MOD A4C: 252.0 ml LV vol s, MOD A2C: 96.3 ml LV vol s, MOD A4C: 161.0 ml LV SV MOD A2C:     89.7 ml LV SV MOD A4C:     252.0 ml LV SV MOD BP:      92.2 ml LEFT ATRIUM              Index LA diam:        3.90 cm 1.90 cm/m LA Vol (  A2C):   24.1 ml 11.73 ml/m LA Vol (A4C):   52.9 ml 25.76 ml/m LA Biplane Vol: 37.3 ml 18.16 ml/m  AORTIC VALVE LVOT Vmax:   85.40 cm/s LVOT Vmean:  55.200 cm/s LVOT VTI:    0.155 m  AORTA Ao Root diam: 3.10 cm Ao Asc diam:  2.70 cm  SHUNTS Systemic VTI:  0.16 m Systemic Diam: 1.70 cm Charlton Haws MD Electronically signed by Charlton Haws MD Signature Date/Time: 04/30/2023/2:20:11 PM    Final     Cardiac Studies:  ECG: SR inferior Q waves slight ST elevation inferior and precordial leads    Telemetry:   NSR  Echo: pending  Medications:    aspirin EC  81 mg Oral Daily   carvedilol  3.125 mg Oral BID WC   Chlorhexidine Gluconate Cloth  6 each Topical Daily   insulin aspart  0-15 Units Subcutaneous TID WC   insulin aspart  0-5 Units Subcutaneous QHS   insulin aspart  2 Units Subcutaneous TID WC   [START ON 05/02/2023] insulin glargine-yfgn  30 Units Subcutaneous Daily   warfarin  7.5 mg Oral ONCE-1600   Warfarin - Pharmacist Dosing Inpatient   Does not apply q1600      cefTRIAXone (ROCEPHIN)  IV 200 mL/hr at 04/30/23 1741   sodium chloride      Assessment/Plan:   SEMI:  Seen by Dr Tresa Endo 04/29/23 Despite troponin 6842 and slight inferior/precordial ST elevation not thought to be a candidate for cath given DKA, lactic acidosis, encephalopathy, DNR and renal failure with Cr  3.31 this am. Has known CAD from cath 07/26/22 Occluded mid LAD and small diffusely dx RCA with occluded distal vessel No targets for revasularization. Admitted with recurrent sepsis ? UTI and DKA. On rocephin and insulin drip BP improved  EF 30-35% by TTE March 2024 pending this admission Appears to be only on low dose coumadin. Hct falling 24.9 this am with normal PLT 383 Started low dose ASA and coreg. Not a candidate for ACE/ARB/ARNI with significant renal failure Cr improved today 2.71, Troponin coming down 3931 Surprisingly no change in EF by TTE 04/30/23 EF  still 30-35%   Cardiology will sign off outpatient f/u Dr Shela Commons. Annice Pih 05/01/2023, 10:11 AM

## 2023-05-02 ENCOUNTER — Encounter (HOSPITAL_COMMUNITY): Payer: Self-pay | Admitting: Emergency Medicine

## 2023-05-02 DIAGNOSIS — I214 Non-ST elevation (NSTEMI) myocardial infarction: Secondary | ICD-10-CM

## 2023-05-02 DIAGNOSIS — S42215A Unspecified nondisplaced fracture of surgical neck of left humerus, initial encounter for closed fracture: Secondary | ICD-10-CM

## 2023-05-02 DIAGNOSIS — E1011 Type 1 diabetes mellitus with ketoacidosis with coma: Secondary | ICD-10-CM | POA: Diagnosis not present

## 2023-05-02 DIAGNOSIS — G9341 Metabolic encephalopathy: Secondary | ICD-10-CM

## 2023-05-02 DIAGNOSIS — E1022 Type 1 diabetes mellitus with diabetic chronic kidney disease: Secondary | ICD-10-CM

## 2023-05-02 DIAGNOSIS — N184 Chronic kidney disease, stage 4 (severe): Secondary | ICD-10-CM

## 2023-05-02 DIAGNOSIS — I5042 Chronic combined systolic (congestive) and diastolic (congestive) heart failure: Secondary | ICD-10-CM

## 2023-05-02 DIAGNOSIS — R7989 Other specified abnormal findings of blood chemistry: Secondary | ICD-10-CM | POA: Insufficient documentation

## 2023-05-02 DIAGNOSIS — S42302A Unspecified fracture of shaft of humerus, left arm, initial encounter for closed fracture: Secondary | ICD-10-CM

## 2023-05-02 LAB — GLUCOSE, CAPILLARY
Glucose-Capillary: 153 mg/dL — ABNORMAL HIGH (ref 70–99)
Glucose-Capillary: 160 mg/dL — ABNORMAL HIGH (ref 70–99)
Glucose-Capillary: 110 mg/dL — ABNORMAL HIGH (ref 70–99)
Glucose-Capillary: 148 mg/dL — ABNORMAL HIGH (ref 70–99)

## 2023-05-02 LAB — COMPREHENSIVE METABOLIC PANEL
ALT: 71 U/L — ABNORMAL HIGH (ref 0–44)
AST: 278 U/L — ABNORMAL HIGH (ref 15–41)
Albumin: 2 g/dL — ABNORMAL LOW (ref 3.5–5.0)
Alkaline Phosphatase: 356 U/L — ABNORMAL HIGH (ref 38–126)
Anion gap: 9 (ref 5–15)
BUN: 24 mg/dL — ABNORMAL HIGH (ref 6–20)
CO2: 19 mmol/L — ABNORMAL LOW (ref 22–32)
Calcium: 7.9 mg/dL — ABNORMAL LOW (ref 8.9–10.3)
Chloride: 112 mmol/L — ABNORMAL HIGH (ref 98–111)
Creatinine, Ser: 2.24 mg/dL — ABNORMAL HIGH (ref 0.44–1.00)
GFR, Estimated: 28 mL/min — ABNORMAL LOW (ref >60)
Glucose, Bld: 132 mg/dL — ABNORMAL HIGH (ref 70–99)
Potassium: 3.3 mmol/L — ABNORMAL LOW (ref 3.5–5.1)
Sodium: 140 mmol/L (ref 135–145)
Total Bilirubin: 0.8 mg/dL (ref <1.2)
Total Protein: 4.7 g/dL — ABNORMAL LOW (ref 6.5–8.1)

## 2023-05-02 LAB — PHOSPHORUS: Phosphorus: 2.6 mg/dL (ref 2.5–4.6)

## 2023-05-02 LAB — CBC
HCT: 25.9 % — ABNORMAL LOW (ref 36.0–46.0)
Hemoglobin: 8.3 g/dL — ABNORMAL LOW (ref 12.0–15.0)
MCH: 29.6 pg (ref 26.0–34.0)
MCHC: 32 g/dL (ref 30.0–36.0)
MCV: 92.5 fL (ref 80.0–100.0)
Platelets: 364 10*3/uL (ref 150–400)
RBC: 2.8 MIL/uL — ABNORMAL LOW (ref 3.87–5.11)
RDW: 14.5 % (ref 11.5–15.5)
WBC: 7.7 10*3/uL (ref 4.0–10.5)
nRBC: 0 % (ref 0.0–0.2)

## 2023-05-02 LAB — BETA-HYDROXYBUTYRIC ACID: Beta-Hydroxybutyric Acid: 0.34 mmol/L — ABNORMAL HIGH (ref 0.05–0.27)

## 2023-05-02 LAB — MAGNESIUM: Magnesium: 2 mg/dL (ref 1.7–2.4)

## 2023-05-02 LAB — PROTIME-INR
INR: 1.1 (ref 0.8–1.2)
Prothrombin Time: 14.6 s (ref 11.4–15.2)

## 2023-05-02 MED ORDER — POTASSIUM CHLORIDE CRYS ER 20 MEQ PO TBCR
30.0000 meq | EXTENDED_RELEASE_TABLET | Freq: Once | ORAL | Status: AC
  Administered 2023-05-02: 30 meq via ORAL
  Filled 2023-05-02: qty 1

## 2023-05-02 MED ORDER — WARFARIN SODIUM 10 MG PO TABS
10.0000 mg | ORAL_TABLET | Freq: Once | ORAL | Status: AC
  Administered 2023-05-02: 10 mg via ORAL
  Filled 2023-05-02: qty 1

## 2023-05-02 MED ORDER — OXYCODONE HCL 5 MG PO TABS
5.0000 mg | ORAL_TABLET | Freq: Four times a day (QID) | ORAL | Status: DC | PRN
  Administered 2023-05-02 – 2023-05-03 (×3): 5 mg via ORAL
  Filled 2023-05-02 (×4): qty 1

## 2023-05-02 MED ORDER — ENOXAPARIN SODIUM 100 MG/ML IJ SOSY
100.0000 mg | PREFILLED_SYRINGE | Freq: Two times a day (BID) | INTRAMUSCULAR | Status: DC
  Administered 2023-05-02: 100 mg via SUBCUTANEOUS
  Filled 2023-05-02 (×3): qty 1

## 2023-05-02 NOTE — Plan of Care (Signed)
  Problem: Education: Goal: Knowledge of General Education information will improve Description: Including pain rating scale, medication(s)/side effects and non-pharmacologic comfort measures Outcome: Progressing   Problem: Health Behavior/Discharge Planning: Goal: Ability to manage health-related needs will improve Outcome: Progressing   Problem: Clinical Measurements: Goal: Will remain free from infection Outcome: Progressing   Problem: Nutrition: Goal: Adequate nutrition will be maintained Outcome: Progressing   Problem: Coping: Goal: Level of anxiety will decrease Outcome: Not Progressing

## 2023-05-02 NOTE — Progress Notes (Signed)
Heart Failure Navigator Progress Note  Assessed for Heart & Vascular TOC clinic readiness.  Patient has a scheduled [facility] appointment on 05/10/2023. .   Navigator will sign off at this time.   Rhae Hammock, BSN, Scientist, clinical (histocompatibility and immunogenetics) Only

## 2023-05-02 NOTE — Hospital Course (Addendum)
HPI: Pt encephalopathic, therefore HPI obtained from EMR review.   42yoF with PMH as below significant for type 1 DM (uses insulin pump), seizures, CVA on coumadin, CKD stage 4 . Kown CAD with NSTEMI in July 2024. , CKD with baselin creat 3s. Recently seen in ER for left proximal humerus fx s/p fall 12/23.  Recent admits 12/19- 12/22 for DKA and UTI and 03/2023 with seizure, DKA, and AKI. Then 04/29/2023 - who presented with confusion and weakness from home to [facility].  CBG> 600.  Afebrile and hypotensive in ER.  Found to be in recurrent DKA with bicarb < 7, glucose 832, beta-hydroxybutyric acid > 8, K 6, sCr 3.95, pending CBC and UA Concern for sepsis w/ recent UTI, cultured and started on empiric vanc, flagyl, and aztreonam started.  Insulin gtt started.  CXR without acute process.  .  Remains hypotensive despite 3L LR, starting pressors. Pt to be transferred to [facility] for further ICU care.     Per EDP, verified prior code status with pt and her husband, verified no CPR/ intubation.   Significant Events: Admitted 04/29/2023 for DKA  ESTablisedh DNR - "family very vocal about suing hospital if pt is intubated or CPR " -12/27/20244 PM at [facility], ICU in [place]: Daughter is indicated that the insulin pump is working well and sugars were fairly under control on 04/28/2023.  This morning was unsteady and an hour later was extremely lethargic and taken to the ED.  In the ED apparently patient was hypotensive.  According to daughter patient probably received pressors.  Currently normotensive and not on pressors.  Patient's confirmed DNR but family worried about ongoing encephalopathy and obtundation.  Patient is afebrile.. RN at the ICU indicate peripheral line sites moves easily [patient is on Plavix and Coumadin. Cardiology consultation for elevated troponin: Not a good cardiac catheterization candidate if troponin increases then consider IV heparin.  Avoid QT prolongation medications.  Hold GDMT with low  blood pressure.  Patient's heart grossly volume overloaded. LEFT FEMORAL CVL PLACED IN ER 04/29/23 12/28: On room air, on insulin infusion.  Appears to be off Levophed infusion [in fact she was not at the time of arrival to the ICU] creatinine marginally improved.  Insulin infusion rate is at 2.6 units/h [arrival yesterday was on 8 units].  Afebrile.  Is on antibiotics so far culture negative.  This morning more awake but ongoing confusion present according to nursing.   - Has purewick --urine appears discolored.  Mild fold smell in the perineal area present according to nursing. - Trop 2.6K -> 6.8K yesterday; pending today - BHOB - 0.1, gap 7, bic 21 this morning and all acceptable  INR 2.8 CARDS EVAL EF 35% global hypokienss Start low dose ASA and coreg. Not a candidate for ACE/ARB/ARNI with significant renal failure  12/29 O n room air. AFebrile. BCID with staph epi. Oheerwise culture neg. CReat improved to 2.7mg . BHOB worse than 24h 0.1 > 0.82. TROP down to 3900. HHuge improivement in mental status. She is hungry and wants to eat    Antibiotic Therapy: Anti-infectives (From admission, onward)    Start     Dose/Rate Route Frequency Ordered Stop   04/30/23 1230  linezolid (ZYVOX) IVPB 600 mg  Status:  Discontinued        600 mg 300 mL/hr over 60 Minutes Intravenous Every 12 hours 04/30/23 1131 05/01/23 0943   04/30/23 1030  cefTRIAXone (ROCEPHIN) 1 g in sodium chloride 0.9 % 100 mL IVPB  1 g 200 mL/hr over 30 Minutes Intravenous Every 24 hours 04/30/23 0938     04/29/23 1000  vancomycin (VANCOCIN) IVPB 1000 mg/200 mL premix        1,000 mg 200 mL/hr over 60 Minutes Intravenous Every 1 hr x 2 04/29/23 0942 04/29/23 1414   04/29/23 0945  aztreonam (AZACTAM) 2 g in sodium chloride 0.9 % 100 mL IVPB        2 g 200 mL/hr over 30 Minutes Intravenous  Once 04/29/23 0939 04/29/23 1205   04/29/23 0945  metroNIDAZOLE (FLAGYL) IVPB 500 mg        500 mg 100 mL/hr over 60 Minutes  Intravenous  Once 04/29/23 0939 04/29/23 1313   04/29/23 0945  vancomycin (VANCOCIN) IVPB 1000 mg/200 mL premix  Status:  Discontinued        1,000 mg 200 mL/hr over 60 Minutes Intravenous  Once 04/29/23 4098 04/29/23 1191       Procedures:   Consultants: PCCM cardiology

## 2023-05-02 NOTE — Assessment & Plan Note (Signed)
05-02-2023 continue with coreg.  Per cards, pt not a candidate for ARB/ACEI/ARNI due to CKD. If HTN becomes an issue, can add hydralazine or long acting nitrates.

## 2023-05-02 NOTE — Assessment & Plan Note (Addendum)
Likely due to shock liver from DKA, hypovolemia. Repeat LFTs. Hold statins for now.  05-03-2023 LFTs improving. AST 278 -->147, ALT 71-->91.  Will need repeat LFTs in PCP office in 1-2 weeks, then restart lipitor.

## 2023-05-02 NOTE — Progress Notes (Addendum)
PHARMACY - ANTICOAGULATION CONSULT NOTE  Pharmacy Consult for Warfarin Indication:  CVA, severe PVD  Allergies  Allergen Reactions   Visipaque [Iodixanol] Nausea And Vomiting    Patient had vagal response during procedure became hypertensive, and bradycardic. CO2 used during procedure prior to contrast being used, this may be cause as well. Recommended premedicating prior to contrast being used in the future.    Wellbutrin [Bupropion] Hives   Gabapentin     dizzy   Cefepime Rash    Tolerates penicilllin   Ciprofloxacin Hcl Hives and Rash    Hives/rash at injection site; 01/15/22 tolerated IV cipro   Tape Rash    Patient Measurements: Height: 5\' 5"  (165.1 cm) Weight: 99.5 kg (219 lb 5.7 oz) IBW/kg (Calculated) : 57  Vital Signs: Temp: 98.2 F (36.8 C) (12/30 0529) Temp Source: Oral (12/30 0529) BP: 125/80 (12/30 0529) Pulse Rate: 82 (12/30 0529)  Labs: Recent Labs    04/29/23 2218 04/30/23 0206 04/30/23 0446 04/30/23 0930 05/01/23 0359 05/01/23 0520 05/02/23 0551  HGB 8.5*  --  8.3*  --  8.2*  --  8.3*  HCT 25.5*  --  24.9*  --  24.8*  --  25.9*  PLT 351  --  383  --  385  --  364  LABPROT  --   --  29.4*  --   --  16.4* 14.6  INR  --   --  2.8*  --   --  1.3* 1.1  CREATININE  --    < > 3.39* 3.31* 2.71*  --  2.24*  TROPONINIHS 1,610*  --   --  7,045* 3,931*  --   --    < > = values in this interval not displayed.    Estimated Creatinine Clearance: 39 mL/min (A) (by C-G formula based on SCr of 2.24 mg/dL (H)).   Medical History: Past Medical History:  Diagnosis Date   Anemia    CAD (coronary artery disease)    a. s/p cath in 03/2014 showing 30% mid-LAD, moderate to severe disease along small D1, patent LCx, moderate to severe distal OM2 stenosis and moderate diffuse diease along RCA not amenable to PCI   CHF (congestive heart failure) (HCC)    a. EF 55-60% in 12/2019 b. EF at 35-40% by echo in 05/2020   CKD (chronic kidney disease) stage 4, GFR 15-29 ml/min  (HCC)    Diabetes mellitus without complication (HCC)    Myocardial infarction (HCC)    Neuropathy    PVD (peripheral vascular disease) (HCC)    Stroke (HCC) 01/2022   L hand weakness    Medications:  Medications Prior to Admission  Medication Sig Dispense Refill Last Dose/Taking   acetaminophen (TYLENOL) 325 MG tablet Take 1-2 tablets (325-650 mg total) by mouth every 4 (four) hours as needed for mild pain. (Patient taking differently: Take 650 mg by mouth as needed for mild pain (pain score 1-3).) 100 tablet 0 04/29/2023   atorvastatin (LIPITOR) 40 MG tablet TAKE 1 TABLET BY MOUTH ONCE DAILY. 90 tablet 1 04/28/2023   clonazePAM (KLONOPIN) 1 MG disintegrating tablet Take 1 tablet (1 mg total) by mouth 2 (two) times daily as needed for seizure (For seizure lasting more than 2 minutes.). 60 tablet 0 Taking As Needed   clopidogrel (PLAVIX) 75 MG tablet Take 1 tablet (75 mg total) by mouth daily. 30 tablet 11 04/28/2023   furosemide (LASIX) 40 MG tablet TAKE 1 TABLET BY MOUTH ONCE DAILY AS NEEDED FOR  SWELLING 30 tablet 0 04/28/2023   hydrALAZINE (APRESOLINE) 25 MG tablet Take 1 tablet (25 mg total) by mouth in the morning and at bedtime.   04/28/2023   ibuprofen (ADVIL) 200 MG tablet Take 800 mg by mouth every 6 (six) hours as needed for moderate pain (pain score 4-6).   04/28/2023   insulin aspart (NOVOLOG) 100 UNIT/ML injection Use with Omnipod for daily dose around 100 units daily (Patient taking differently: Inject 0-200 Units into the skin See admin instructions. Load Omnipod with 200 units every 3-4 days.) 80 mL 3 04/29/2023   isosorbide mononitrate (IMDUR) 60 MG 24 hr tablet TAKE 2 TABLETS BY MOUTH ONCE DAILY. 60 tablet 1 04/28/2023   [EXPIRED] levofloxacin (LEVAQUIN) 500 MG tablet Take 1 tablet (500 mg total) by mouth every other day for 3 doses. 3 tablet 0 04/28/2023   levothyroxine (SYNTHROID) 125 MCG tablet Take 1 tablet (125 mcg total) by mouth daily at 6 (six) AM. 90 tablet 3  04/28/2023   metoprolol succinate (TOPROL-XL) 50 MG 24 hr tablet TAKE 1 TABLET BY MOUTH ONCE DAILY . APPOINTMENT REQUIRED FOR FUTURE REFILLS 15 tablet 0 04/28/2023   Multiple Vitamin (MULTIVITAMIN WITH MINERALS) TABS tablet Take 1 tablet by mouth daily.   Taking   nitroGLYCERIN (NITROSTAT) 0.4 MG SL tablet Place 1 tablet (0.4 mg total) under the tongue every 5 (five) minutes x 3 doses as needed for chest pain. 25 tablet 3 Taking As Needed   oxyCODONE-acetaminophen (PERCOCET) 7.5-325 MG tablet Take 1 tablet by mouth daily as needed for severe pain. 30 tablet 0 Past Week   pantoprazole (PROTONIX) 40 MG tablet Take 1 tablet (40 mg total) by mouth daily. Courtesy fill/ pt to get established with a primary MD for further refills (Patient taking differently: Take 40 mg by mouth daily as needed (reflux).) 90 tablet 1 Past Month   ranolazine (RANEXA) 1000 MG SR tablet TAKE 1  BY MOUTH TWICE DAILY 60 tablet 0 04/28/2023   senna-docusate (SENOKOT-S) 8.6-50 MG tablet Take 1-2 tablets by mouth daily as needed for mild constipation. 30 tablet 0 Taking As Needed   tiZANidine (ZANAFLEX) 4 MG tablet Take 1 tablet (4 mg total) by mouth at bedtime. 30 tablet 1 04/28/2023   topiramate (TOPAMAX) 50 MG tablet Take 1 tablet (50 mg total) by mouth 2 (two) times daily. 60 tablet 3 04/28/2023   traZODone (DESYREL) 100 MG tablet Take 100 mg by mouth at bedtime.   04/28/2023   warfarin (COUMADIN) 5 MG tablet TAKE ONE TABLET BY MOUTH DAILY EXCEPT 1/2 TABLET ON WEDNESDAYS AND SATURDAYS OR AS DIRECTED (Patient taking differently: Take 2.5-5 mg by mouth daily. Take 5mg  (1 tablet) daily, except on Tuesday, Thursday, and Saturday's take 2.5mg  (1/2 tablet).) 40 tablet 5 04/28/2023 at  9:00 PM   Continuous Glucose Sensor (DEXCOM G6 SENSOR) MISC CHANGE SENSOR EVERY 10 DAYS. 3 each 1    Continuous Glucose Transmitter (DEXCOM G6 TRANSMITTER) MISC Change transmitter every 90 days as directed. 1 each 3    Insulin Disposable Pump (OMNIPOD 5  DEXG7G6 PODS GEN 5) MISC Change pod every 48-72 hours. 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 Patient admitted 12/27 with altered mental status in DKA and urinary sepsis. Noted to have elevated INR at 9.6 and oozing from femoral site. Vitamin K 5mg  IV x1 on 12/28 given and thrombi pad placed. Bleeding has resolved.  Pharmacy consulted to resume Warfarin therapy.   INR downtrending to 1.1, patient received 7.5mg  x1 on 12/29.  H/H low-stable. Platelets within normal limits. No further bleeding observed.   PTA Warfarin regimen: 2.5mg  Tuesday/Thursday/Saturday and 5 mg all other days.   Goal of Therapy:  INR 2-3 Monitor platelets by anticoagulation protocol: Yes   Plan:  Warfarin 10mg  po x1 tonight (increased dose due to vit k resistance, home dose 5mg ) Anticipate that INR will begin to uptrend in the next 24 hours. Likely will take 3-5 days to see impact of increased warfarin dosing.  Monitor daily PT/INR  Estill Batten, PharmD, BCCCP  Please refer to [facility] for [facility] numbers 05/02/2023,7:20 AM  ADDENDUM:   Consulted for therapeutic Lovenox bridge due to high risk as patient had a stroke previously while on Eliquis and INR currently sub-therapeutic. Will start 100mg  Lovenox every 12 hours. Monitor renal function.   Estill Batten, PharmD, BCCCP

## 2023-05-02 NOTE — Progress Notes (Addendum)
PROGRESS NOTE    Lauren [name]  HKV:[id number] DOB: [birth year] DOA: 04/29/2023 PCP: Billie Lade, MD  Subjective: Pt seen and examined. Pt's care transferred to hospitalist service today. Pt admitted for DKA and acute metabolic encephalopathy due to DKA.  Staph epi only 1 of 2 bottles. Discussed with pharmacy. Will consider this a contaminant and not a true blood stream infection.   Hospital Course: HPI: Pt encephalopathic, therefore HPI obtained from EMR review.   42yoF with PMH as below significant for type 1 DM (uses insulin pump), seizures, CVA on coumadin, CKD stage 4 . Kown CAD with NSTEMI in July 2024. , CKD with baselin creat 3s. Recently seen in ER for left proximal humerus fx s/p fall 12/23.  Recent admits 12/19- 12/22 for DKA and UTI and 03/2023 with seizure, DKA, and AKI. Then 04/29/2023 - who presented with confusion and weakness from home to [facility].  CBG> 600.  Afebrile and hypotensive in ER.  Found to be in recurrent DKA with bicarb < 7, glucose 832, beta-hydroxybutyric acid > 8, K 6, sCr 3.95, pending CBC and UA Concern for sepsis w/ recent UTI, cultured and started on empiric vanc, flagyl, and aztreonam started.  Insulin gtt started.  CXR without acute process.  .  Remains hypotensive despite 3L LR, starting pressors. Pt to be transferred to [facility] for further ICU care.     Per EDP, verified prior code status with pt and her husband, verified no CPR/ intubation.   Significant Events: Admitted 04/29/2023 for DKA  ESTablisedh DNR - "family very vocal about suing hospital if pt is intubated or CPR " -12/27/20244 PM at [facility], ICU in [place]: Daughter is indicated that the insulin pump is working well and sugars were fairly under control on 04/28/2023.  This morning was unsteady and an hour later was extremely lethargic and taken to the ED.  In the ED apparently patient was hypotensive.  According to daughter patient probably received pressors.  Currently normotensive and  not on pressors.  Patient's confirmed DNR but family worried about ongoing encephalopathy and obtundation.  Patient is afebrile.. RN at the ICU indicate peripheral line sites moves easily [patient is on Plavix and Coumadin. Cardiology consultation for elevated troponin: Not a good cardiac catheterization candidate if troponin increases then consider IV heparin.  Avoid QT prolongation medications.  Hold GDMT with low blood pressure.  Patient's heart grossly volume overloaded. LEFT FEMORAL CVL PLACED IN ER 04/29/23 12/28: On room air, on insulin infusion.  Appears to be off Levophed infusion [in fact she was not at the time of arrival to the ICU] creatinine marginally improved.  Insulin infusion rate is at 2.6 units/h [arrival yesterday was on 8 units].  Afebrile.  Is on antibiotics so far culture negative.  This morning more awake but ongoing confusion present according to nursing.   - Has purewick --urine appears discolored.  Mild fold smell in the perineal area present according to nursing. - Trop 2.6K -> 6.8K yesterday; pending today - BHOB - 0.1, gap 7, bic 21 this morning and all acceptable  INR 2.8 CARDS EVAL EF 35% global hypokienss Start low dose ASA and coreg. Not a candidate for ACE/ARB/ARNI with significant renal failure  12/29 O n room air. AFebrile. BCID with staph epi. Oheerwise culture neg. CReat improved to 2.7mg . BHOB worse than 24h 0.1 > 0.82. TROP down to 3900. HHuge improivement in mental status. She is hungry and wants to eat    Antibiotic Therapy: Anti-infectives (From admission,  onward)    Start     Dose/Rate Route Frequency Ordered Stop   04/30/23 1230  linezolid (ZYVOX) IVPB 600 mg  Status:  Discontinued        600 mg 300 mL/hr over 60 Minutes Intravenous Every 12 hours 04/30/23 1131 05/01/23 0943   04/30/23 1030  cefTRIAXone (ROCEPHIN) 1 g in sodium chloride 0.9 % 100 mL IVPB        1 g 200 mL/hr over 30 Minutes Intravenous Every 24 hours 04/30/23 0938     04/29/23  1000  vancomycin (VANCOCIN) IVPB 1000 mg/200 mL premix        1,000 mg 200 mL/hr over 60 Minutes Intravenous Every 1 hr x 2 04/29/23 0942 04/29/23 1414   04/29/23 0945  aztreonam (AZACTAM) 2 g in sodium chloride 0.9 % 100 mL IVPB        2 g 200 mL/hr over 30 Minutes Intravenous  Once 04/29/23 0939 04/29/23 1205   04/29/23 0945  metroNIDAZOLE (FLAGYL) IVPB 500 mg        500 mg 100 mL/hr over 60 Minutes Intravenous  Once 04/29/23 0939 04/29/23 1313   04/29/23 0945  vancomycin (VANCOCIN) IVPB 1000 mg/200 mL premix  Status:  Discontinued        1,000 mg 200 mL/hr over 60 Minutes Intravenous  Once 04/29/23 1610 04/29/23 9604       Procedures:   Consultants: PCCM cardiology    Assessment and Plan: * DKA (diabetic ketoacidosis) (HCC) On admission, pt admitted to [facility] service and treated with DKA protocol with insulin gtts. Pt's DKA resolved and pt changed over to SQ insulin. Pt's care transferred to [facility] on 05-02-2023.  05-02-2023 CBG stable. I asked pt to communicate to her family and have them bring her insulin pump to hospital so that it could be restarted.  SEMI (subendocardial myocardial infarction) [facility]) On admission, pt seen by cardiology. She was diagnosed with subendocardial MI. Pt with known occluded CAD. Cardiology felt that pt did not have any targets for intervention given her prior LHC in 07-2022. Pt treated medically. Repeat echo 04-30-2023 showed stable LVEF of 30 to 35%.  05-02-2023 continue with medical management. Cardiology signed off her case on 05-01-2023. Per cards note 05-01-2023 "Not a candidate for ACE/ARB/ARNI with significant renal failure". Continue with coreg 3.125 mg bid, ASA 81 mg qday. No statin due to elevated LFTs.  Left humeral fracture This occurred prior to admission. Her left shoulder Xr are from 04-25-2023. Pt admitted on 04-29-2023.  05-02-2023 continue with prn oxycodone. Will need ortho f/u. Keep left arm in sling.  Acute metabolic  encephalopathy On admission, pt noted to be encephalopathic. This was felt to be due to her DKA.  This resolved with treatment of her DKA.  DNR (do not resuscitate) Pt made DNR on admission.  History of CVA (cerebrovascular accident) Pt on coumadin for prior hx of CVA on Eliquis. Had been on lovenox until dec 2023 when she was changed over to coumadin  05-02-2023 start lovenox as a bridge to coumadin. INR 1.1 today. Pharmacy dosing coumadin.  Chronic combined systolic and diastolic heart failure (HCC) 05-02-2023 LVEF stable at 30-35%. Cardiology signed off 05-01-2023. Continue with coreg 3.125 mg bid only.  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) - baseline Scr 2.8-3.0 05-02-2023 baseline Scr 2.8-3.0  Essential hypertension 05-02-2023 continue with coreg.  Per cards, pt not a candidate for ARB/ACEI/ARNI due to CKD. If HTN becomes an issue, can add hydralazine or  long acting nitrates.  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 Until insulin pump is restarted, continue with lantus 30 u qday, TID mealtime 2 units novolog  and SSI.  Elevated LFTs Likely due to shock liver from DKA, hypovolemia. Repeat LFTs. Hold statins for now.  Acute kidney injury superimposed on stage 4 chronic kidney disease (HCC) Pt admitted with Scr 3.95. baseline Scr 2.8-3.0. with treatment of her DKA, Scr has trended back to her baseline.       DVT prophylaxis: SCDs Start: 04/29/23 1824 warfarin (COUMADIN) tablet 10 mg  Lovenox   Code Status: Limited: Do not attempt resuscitation (DNR) -DNR-LIMITED -Do Not Intubate/DNI  Family Communication: no family at bedside Disposition Plan: return home Reason for continuing need for hospitalization: need to restart her insulin pump and ensure CBG stability.  Objective: Vitals:   05/01/23 1748 05/01/23 2056 05/02/23 0529 05/02/23 0802  BP: (!) 155/86 (!)  145/90 125/80 (!) 142/93  Pulse: 100 90 82 87  Resp: 17 18 19 18   Temp: (!) 97.5 F (36.4 C) 98.1 F (36.7 C) 98.2 F (36.8 C) 98 F (36.7 C)  TempSrc: Oral Oral Oral Oral  SpO2: 100% 100% 100% 100%  Weight:      Height:        Intake/Output Summary (Last 24 hours) at 05/02/2023 1319 Last data filed at 05/02/2023 1205 Gross per 24 hour  Intake 400 ml  Output 450 ml  Net -50 ml   Filed Weights   04/29/23 1630 04/30/23 0102 05/01/23 0410  Weight: 101.2 kg 99.1 kg 99.5 kg    Examination:  Physical Exam Vitals and nursing note reviewed.  Constitutional:      General: She is not in acute distress.    Appearance: She is not toxic-appearing.     Comments: Appears chronically ill  HENT:     Head: Normocephalic.     Nose: Nose normal.  Eyes:     General: No scleral icterus. Cardiovascular:     Rate and Rhythm: Normal rate and regular rhythm.     Pulses: Normal pulses.  Pulmonary:     Effort: Pulmonary effort is normal.     Breath sounds: Normal breath sounds.  Abdominal:     General: Bowel sounds are normal.     Palpations: Abdomen is soft.  Musculoskeletal:     Comments: Left arm in sling  Skin:    General: Skin is warm and dry.     Capillary Refill: Capillary refill takes less than 2 seconds.  Neurological:     Mental Status: She is alert and oriented to person, place, and time.     Comments: Left hemiparesis     Data Reviewed: I have personally reviewed following labs and imaging studies  CBC: Recent Labs  Lab 04/29/23 2218 04/30/23 0446 05/01/23 0359 05/02/23 0551  WBC 16.2* 21.2* 14.1* 7.7  NEUTROABS 14.2*  --   --   --   HGB 8.5* 8.3* 8.2* 8.3*  HCT 25.5* 24.9* 24.8* 25.9*  MCV 89.8 89.2 90.2 92.5  PLT 351 383 385 364   Basic Metabolic Panel: Recent Labs  Lab 04/29/23 1958 04/29/23 2218 04/30/23 0206 04/30/23 0446 04/30/23 0930 05/01/23 0359 05/02/23 0551  NA 131*   < > 135 137 135 137 140  K 4.1   < > 3.5 3.4* 3.4* 3.3* 3.3*  CL 101    < > 106  109 108 111 112*  CO2 14*   < > 18* 21* 17* 18* 19*  GLUCOSE 611*   < > 347* 194* 169* 189* 132*  BUN 45*   < > 42* 43* 44* 34* 24*  CREATININE 3.95*   < > 3.56* 3.39* 3.31* 2.71* 2.24*  CALCIUM 7.5*   < > 7.7* 7.7* 7.6* 7.6* 7.9*  MG 1.9  --   --   --  1.8 2.3 2.0  PHOS  --   --   --   --  3.3 2.8 2.6   < > = values in this interval not displayed.   GFR: Estimated Creatinine Clearance: 39 mL/min (A) (by C-G formula based on SCr of 2.24 mg/dL (H)). Liver Function Tests: Recent Labs  Lab 04/29/23 1128 04/30/23 0446 05/01/23 0359 05/02/23 0551  AST 25 36 28 278*  ALT 20 24 21  71*  ALKPHOS 74 80 76 356*  BILITOT 1.6* 0.5 0.6 0.8  PROT 4.8* 4.6* 4.4* 4.7*  ALBUMIN 2.0* 2.0* 1.8* 2.0*   Coagulation Profile: Recent Labs  Lab 04/29/23 1958 04/30/23 0446 05/01/23 0520 05/02/23 0551  INR 9.6* 2.8* 1.3* 1.1   BNP (last 3 results) Recent Labs    07/27/22 0548 08/02/22 2023 11/07/22 1129  BNP 2,632.3* 869.3* 1,973.2*   CBG: Recent Labs  Lab 05/01/23 1108 05/01/23 1521 05/01/23 2059 05/02/23 0803 05/02/23 1225  GLUCAP 213* 183* 137* 153* 160*   Sepsis Labs: Recent Labs  Lab 04/29/23 1703 04/29/23 2218 04/30/23 0930 05/01/23 0359  PROCALCITON  --   --  12.53 10.76  LATICACIDVEN 3.2* 2.2* 1.5 1.1    Recent Results (from the past 240 hours)  Resp panel by RT-PCR (RSV, Flu A&B, Covid) Anterior Nasal Swab     Status: None   Collection Time: 04/29/23  9:59 AM   Specimen: Anterior Nasal Swab  Result Value Ref Range Status   SARS Coronavirus 2 by RT PCR NEGATIVE NEGATIVE Final    Comment: (NOTE) SARS-CoV-2 target nucleic acids are NOT DETECTED.  The SARS-CoV-2 RNA is generally detectable in upper respiratory specimens during the acute phase of infection. The lowest concentration of SARS-CoV-2 viral copies this assay can detect is 138 copies/mL. A negative result does not preclude SARS-Cov-2 infection and should not be used as the sole basis for  treatment or other patient management decisions. A negative result may occur with  improper specimen collection/handling, submission of specimen other than nasopharyngeal swab, presence of viral mutation(s) within the areas targeted by this assay, and inadequate number of viral copies(<138 copies/mL). A negative result must be combined with clinical observations, patient history, and epidemiological information. The expected result is Negative.  Fact Sheet for Patients:  BloggerCourse.com  Fact Sheet for Healthcare Providers:  SeriousBroker.it  This test is no t yet approved or cleared by the [place] FDA and  has been authorized for detection and/or diagnosis of SARS-CoV-2 by FDA under an Emergency Use Authorization (EUA). This EUA will remain  in effect (meaning this test can be used) for the duration of the COVID-19 declaration under Section 564(b)(1) of the Act, 21 U.S.C.section 360bbb-3(b)(1), unless the authorization is terminated  or revoked sooner.       Influenza A by PCR NEGATIVE NEGATIVE Final   Influenza B by PCR NEGATIVE NEGATIVE Final    Comment: (NOTE) The Xpert Xpress SARS-CoV-2/FLU/RSV plus assay is intended as an aid in the diagnosis of influenza from Nasopharyngeal swab specimens and should not be used as a sole  basis for treatment. Nasal washings and aspirates are unacceptable for Xpert Xpress SARS-CoV-2/FLU/RSV testing.  Fact Sheet for Patients: BloggerCourse.com  Fact Sheet for Healthcare Providers: SeriousBroker.it  This test is not yet approved or cleared by the [place] FDA and has been authorized for detection and/or diagnosis of SARS-CoV-2 by FDA under an Emergency Use Authorization (EUA). This EUA will remain in effect (meaning this test can be used) for the duration of the COVID-19 declaration under Section 564(b)(1) of the Act, 21  U.S.C. section 360bbb-3(b)(1), unless the authorization is terminated or revoked.     Resp Syncytial Virus by PCR NEGATIVE NEGATIVE Final    Comment: (NOTE) Fact Sheet for Patients: BloggerCourse.com  Fact Sheet for Healthcare Providers: SeriousBroker.it  This test is not yet approved or cleared by the [place] FDA and has been authorized for detection and/or diagnosis of SARS-CoV-2 by FDA under an Emergency Use Authorization (EUA). This EUA will remain in effect (meaning this test can be used) for the duration of the COVID-19 declaration under Section 564(b)(1) of the Act, 21 U.S.C. section 360bbb-3(b)(1), unless the authorization is terminated or revoked.  Performed at [facility], [address] [place], [place] [address]   Blood Culture (routine x 2)     Status: Abnormal (Preliminary result)   Collection Time: 04/29/23 11:23 AM   Specimen: BLOOD  Result Value Ref Range Status   Specimen Description   Final    BLOOD LEFT FEMORAL ARTERY Performed at [facility], [address], [place], [place] [address]    Special Requests   Final    BOTTLES DRAWN AEROBIC AND ANAEROBIC Blood Culture results may not be optimal due to an inadequate volume of blood received in culture bottles Performed at [facility], [address], [place], [place] [address]    Culture  Setup Time   Final    GRAM POSITIVE COCCI AEROBIC BOTTLE ONLY Gram Stain Report Called to,Read Back By and Verified With: A PUGH AT [facility] AT 1122 ON [id number] BY S DALTON CRITICAL RESULT CALLED TO, READ BACK BY AND VERIFIED WITH: PHARMD J LEDFORD 05/01/2023 @ 0016 BY AB    Culture (A)  Final    STAPHYLOCOCCUS EPIDERMIDIS THE SIGNIFICANCE OF ISOLATING THIS ORGANISM FROM A SINGLE VENIPUNCTURE CANNOT BE PREDICTED WITHOUT FURTHER CLINICAL AND CULTURE CORRELATION. SUSCEPTIBILITIES AVAILABLE ONLY ON REQUEST. Performed at [facility], [address] [place], [place]  [address]    Report Status PENDING  Incomplete  Blood Culture ID Panel (Reflexed)     Status: Abnormal   Collection Time: 04/29/23 11:23 AM  Result Value Ref Range Status   Enterococcus faecalis NOT DETECTED NOT DETECTED Final   Enterococcus Faecium NOT DETECTED NOT DETECTED Final   Listeria monocytogenes NOT DETECTED NOT DETECTED Final   Staphylococcus species DETECTED (A) NOT DETECTED Final    Comment: CRITICAL RESULT CALLED TO, READ BACK BY AND VERIFIED WITH: PHARMD J LEDFORD 05/01/2023 @ 0016 BY AB    Staphylococcus aureus (BCID) NOT DETECTED NOT DETECTED Final   Staphylococcus epidermidis DETECTED (A) NOT DETECTED Final    Comment: Methicillin (oxacillin) resistant coagulase negative staphylococcus. Possible blood culture contaminant (unless isolated from more than one blood culture draw or clinical case suggests pathogenicity). No antibiotic treatment is indicated for blood  culture contaminants. CRITICAL RESULT CALLED TO, READ BACK BY AND VERIFIED WITH: PHARMD J LEDFORD 05/01/2023 @ 0016 BY AB    Staphylococcus lugdunensis NOT DETECTED NOT DETECTED Final   Streptococcus species NOT DETECTED NOT DETECTED  Final   Streptococcus agalactiae NOT DETECTED NOT DETECTED Final   Streptococcus pneumoniae NOT DETECTED NOT DETECTED Final   Streptococcus pyogenes NOT DETECTED NOT DETECTED Final   A.calcoaceticus-baumannii NOT DETECTED NOT DETECTED Final   Bacteroides fragilis NOT DETECTED NOT DETECTED Final   Enterobacterales NOT DETECTED NOT DETECTED Final   Enterobacter cloacae complex NOT DETECTED NOT DETECTED Final   Escherichia coli NOT DETECTED NOT DETECTED Final   Klebsiella aerogenes NOT DETECTED NOT DETECTED Final   Klebsiella oxytoca NOT DETECTED NOT DETECTED Final   Klebsiella pneumoniae NOT DETECTED NOT DETECTED Final   Proteus species NOT DETECTED NOT DETECTED Final   Salmonella species NOT DETECTED NOT DETECTED Final   Serratia marcescens NOT DETECTED NOT DETECTED Final    Haemophilus influenzae NOT DETECTED NOT DETECTED Final   Neisseria meningitidis NOT DETECTED NOT DETECTED Final   Pseudomonas aeruginosa NOT DETECTED NOT DETECTED Final   Stenotrophomonas maltophilia NOT DETECTED NOT DETECTED Final   Candida albicans NOT DETECTED NOT DETECTED Final   Candida auris NOT DETECTED NOT DETECTED Final   Candida glabrata NOT DETECTED NOT DETECTED Final   Candida krusei NOT DETECTED NOT DETECTED Final   Candida parapsilosis NOT DETECTED NOT DETECTED Final   Candida tropicalis NOT DETECTED NOT DETECTED Final   Cryptococcus neoformans/gattii NOT DETECTED NOT DETECTED Final   Methicillin resistance mecA/C DETECTED (A) NOT DETECTED Final    Comment: CRITICAL RESULT CALLED TO, READ BACK BY AND VERIFIED WITH: PHARMD J LEDFORD 05/01/2023 @ 0016 BY AB Performed at [facility], [address], [place], [place] [address]   Culture, blood (Routine X 2) w Reflex to ID Panel     Status: None (Preliminary result)   Collection Time: 04/29/23  5:03 PM   Specimen: BLOOD  Result Value Ref Range Status   Specimen Description BLOOD SITE NOT SPECIFIED  Final   Special Requests   Final    BOTTLES DRAWN AEROBIC ONLY Blood Culture results may not be optimal due to an inadequate volume of blood received in culture bottles   Culture   Final    NO GROWTH 2 DAYS Performed at [facility], [address] [place], [place] [address]    Report Status PENDING  Incomplete  Urine Culture (for pregnant, neutropenic or urologic patients or patients with an indwelling urinary catheter)     Status: Abnormal   Collection Time: 04/30/23  9:11 AM   Specimen: Urine, Bag (ped)  Result Value Ref Range Status   Specimen Description URINE, BAG PED  Final   Special Requests NONE  Final   Culture (A)  Final    <10,000 COLONIES/mL INSIGNIFICANT GROWTH Performed at [facility], [address] [place], [place] [address]    Report Status 05/01/2023 FINAL  Final  MRSA Next Gen by  PCR, Nasal     Status: None   Collection Time: 04/30/23  9:36 AM   Specimen: Nasal Mucosa; Nasal Swab  Result Value Ref Range Status   MRSA by PCR Next Gen NOT DETECTED NOT DETECTED Final    Comment: (NOTE) The GeneXpert MRSA Assay (FDA approved for NASAL specimens only), is one component of a comprehensive MRSA colonization surveillance program. It is not intended to diagnose MRSA infection nor to guide or monitor treatment for MRSA infections. Test performance is not FDA approved in patients less than 5 years old. Performed at [facility], [address], [place], [place] [address]      Radiology Studies:  US RENAL Result Date: 04/30/2023 CLINICAL DATA:  Acute on chronic renal failure EXAM: RENAL / URINARY TRACT ULTRASOUND COMPLETE COMPARISON:  01/10/2022, 08/03/2022 FINDINGS: Right Kidney: Renal measurements: 9.1 x 4.9 x 6.0 cm = volume: 138.4 mL. Normal renal echotexture. Nonobstructing 6 mm calculus lower pole right kidney. No hydronephrosis. Left Kidney: Not visualized due to overlying bowel gas. Bladder: Appears normal for degree of bladder distention. Other: None. IMPRESSION: 1. Nonvisualization of the left kidney due to poor acoustic window and overlying bowel gas. 2. Nonobstructing 6 mm right renal calculus. Otherwise unremarkable right kidney. Electronically Signed   By: Sharlet Salina M.D.   On: 04/30/2023 17:25   ECHOCARDIOGRAM COMPLETE Result Date: 04/30/2023    ECHOCARDIOGRAM REPORT   Patient Name:   SILVA SCHOPP Date of Exam: 04/30/2023 Medical Rec #:  [id number]       Height:       65.0 in Accession #:    [id number]      Weight:       218.5 lb Date of Birth:  1981/01/20       BSA:          2.054 m Patient Age:    42 years        BP:           134/73 mmHg Patient Gender: F               HR:           108 bpm. Exam Location:  Inpatient Procedure: 2D Echo, Color Doppler, Cardiac Doppler and Intracardiac            Opacification Agent Indications:    Elevated troponin   History:        Patient has prior history of Echocardiogram examinations, most                 recent 07/24/2022.  Sonographer:    Harriette Bouillon RDCS Referring Phys: [id number] Jonita Albee IMPRESSIONS  1. Left ventricular ejection fraction, by estimation, is 30 to 35%. The left ventricle has moderately decreased function. The left ventricle demonstrates global hypokinesis. The left ventricular internal cavity size was moderately dilated. Left ventricular diastolic parameters are indeterminate.  2. Right ventricular systolic function is normal. The right ventricular size is normal.  3. Left atrial size was mildly dilated.  4. The mitral valve is normal in structure. Trivial mitral valve regurgitation. No evidence of mitral stenosis.  5. The aortic valve is tricuspid. Aortic valve regurgitation is not visualized. No aortic stenosis is present.  6. The inferior vena cava is normal in size with greater than 50% respiratory variability, suggesting right atrial pressure of 3 mmHg. FINDINGS  Left Ventricle: Left ventricular ejection fraction, by estimation, is 30 to 35%. The left ventricle has moderately decreased function. The left ventricle demonstrates global hypokinesis. Definity contrast agent was given IV to delineate the left ventricular endocardial borders. The left ventricular internal cavity size was moderately dilated. There is no left ventricular hypertrophy. Left ventricular diastolic parameters are indeterminate. Right Ventricle: The right ventricular size is normal. No increase in right ventricular wall thickness. Right ventricular systolic function is normal. Left Atrium: Left atrial size was mildly dilated. Right Atrium: Right atrial size was normal in size. Pericardium: Trivial pericardial effusion is present. The pericardial effusion is posterior to the left ventricle. Mitral Valve: The mitral valve is normal in structure. Trivial mitral valve regurgitation. No evidence of mitral valve stenosis.  Tricuspid Valve: The tricuspid valve is  normal in structure. Tricuspid valve regurgitation is mild . No evidence of tricuspid stenosis. Aortic Valve: The aortic valve is tricuspid. Aortic valve regurgitation is not visualized. No aortic stenosis is present. Pulmonic Valve: The pulmonic valve was normal in structure. Pulmonic valve regurgitation is not visualized. No evidence of pulmonic stenosis. Aorta: The aortic root is normal in size and structure. Venous: The inferior vena cava is normal in size with greater than 50% respiratory variability, suggesting right atrial pressure of 3 mmHg. IAS/Shunts: No atrial level shunt detected by color flow Doppler.  LEFT VENTRICLE PLAX 2D LVIDd:         5.30 cm      Diastology LVIDs:         4.00 cm      LV e' lateral: 11.50 cm/s LV PW:         1.00 cm LV IVS:        1.00 cm LVOT diam:     1.70 cm LV SV:         35 LV SV Index:   17 LVOT Area:     2.27 cm  LV Volumes (MOD) LV vol d, MOD A2C: 186.0 ml LV vol d, MOD A4C: 252.0 ml LV vol s, MOD A2C: 96.3 ml LV vol s, MOD A4C: 161.0 ml LV SV MOD A2C:     89.7 ml LV SV MOD A4C:     252.0 ml LV SV MOD BP:      92.2 ml LEFT ATRIUM             Index LA diam:        3.90 cm 1.90 cm/m LA Vol (A2C):   24.1 ml 11.73 ml/m LA Vol (A4C):   52.9 ml 25.76 ml/m LA Biplane Vol: 37.3 ml 18.16 ml/m  AORTIC VALVE LVOT Vmax:   85.40 cm/s LVOT Vmean:  55.200 cm/s LVOT VTI:    0.155 m  AORTA Ao Root diam: 3.10 cm Ao Asc diam:  2.70 cm  SHUNTS Systemic VTI:  0.16 m Systemic Diam: 1.70 cm Charlton Haws MD Electronically signed by Charlton Haws MD Signature Date/Time: 04/30/2023/2:20:11 PM    Final     Scheduled Meds:  aspirin EC  81 mg Oral Daily   carvedilol  3.125 mg Oral BID WC   Chlorhexidine Gluconate Cloth  6 each Topical Daily   enoxaparin (LOVENOX) injection  100 mg Subcutaneous Q12H   insulin aspart  0-15 Units Subcutaneous TID WC   insulin aspart  0-5 Units Subcutaneous QHS   insulin aspart  2 Units Subcutaneous TID WC   insulin  glargine-yfgn  30 Units Subcutaneous Daily   potassium chloride  30 mEq Oral Once   warfarin  10 mg Oral ONCE-1600   Warfarin - Pharmacist Dosing Inpatient   Does not apply q1600   Continuous Infusions:  cefTRIAXone (ROCEPHIN)  IV 1 g (05/02/23 0836)     LOS: 3 days   Time spent: 40 minutes  Carollee Herter, DO  [facility]  05/02/2023, 1:19 PM

## 2023-05-02 NOTE — Assessment & Plan Note (Addendum)
05-02-2023 asked pt to have family bring in her insulin pump to get this restarted today. Until insulin pump is restarted, continue with lantus 30 u qday, TID mealtime 2 units novolog  and SSI.

## 2023-05-02 NOTE — Assessment & Plan Note (Addendum)
Pt admitted with Scr 3.95. baseline Scr 2.8-3.0. with treatment of her DKA, Scr has trended back to her baseline.  05-03-2023 Scr 1.88. will start bicarb 650 mg bid. She will need repeat BMP on PCP f/u appointment.  Nephrology referral made to [facility] for CKD stage 4.

## 2023-05-02 NOTE — Assessment & Plan Note (Signed)
Pt made DNR on admission.

## 2023-05-02 NOTE — Assessment & Plan Note (Addendum)
05-02-2023 baseline Scr 2.8-3.0 05-03-2023  Scr 1.88 today.  Will refer to outpatient nephrology.

## 2023-05-02 NOTE — Assessment & Plan Note (Addendum)
05-02-2023 LVEF stable at 30-35%. Cardiology signed off 05-01-2023. Continue with coreg 3.125 mg bid only. Per cards, pt not a candidate for ARB/ACEI/ARNI due to CKD stage 4. If HTN becomes an issue, can add hydralazine or long acting nitrates.  05-03-2023 BP up today to 174/97. HR 89. Will increase coreg to 6.25 mg bid. She will need referral to CHF clinic.  05-03-2023 pt will resume Toprol-xl 50 mg daily at discharge. Referral made to CHF clinic for combined CHF and cards clinic for CAD. Refilled lasix 40 mg daily prn swelling.

## 2023-05-02 NOTE — Assessment & Plan Note (Signed)
On admission, pt noted to be encephalopathic. This was felt to be due to her DKA.  This resolved with treatment of her DKA.

## 2023-05-03 DIAGNOSIS — E1011 Type 1 diabetes mellitus with ketoacidosis with coma: Secondary | ICD-10-CM | POA: Diagnosis not present

## 2023-05-03 DIAGNOSIS — I214 Non-ST elevation (NSTEMI) myocardial infarction: Secondary | ICD-10-CM | POA: Diagnosis not present

## 2023-05-03 DIAGNOSIS — G9341 Metabolic encephalopathy: Secondary | ICD-10-CM | POA: Diagnosis not present

## 2023-05-03 DIAGNOSIS — I5042 Chronic combined systolic (congestive) and diastolic (congestive) heart failure: Secondary | ICD-10-CM | POA: Diagnosis not present

## 2023-05-03 DIAGNOSIS — I69354 Hemiplegia and hemiparesis following cerebral infarction affecting left non-dominant side: Secondary | ICD-10-CM

## 2023-05-03 DIAGNOSIS — N179 Acute kidney failure, unspecified: Secondary | ICD-10-CM

## 2023-05-03 DIAGNOSIS — R7989 Other specified abnormal findings of blood chemistry: Secondary | ICD-10-CM

## 2023-05-03 DIAGNOSIS — E66812 Obesity, class 2: Secondary | ICD-10-CM

## 2023-05-03 LAB — CBC WITH DIFFERENTIAL/PLATELET
Abs Immature Granulocytes: 0.05 10*3/uL (ref 0.00–0.07)
Basophils Absolute: 0 10*3/uL (ref 0.0–0.1)
Basophils Relative: 0 %
Eosinophils Absolute: 0.2 10*3/uL (ref 0.0–0.5)
Eosinophils Relative: 2 %
HCT: 27.8 % — ABNORMAL LOW (ref 36.0–46.0)
Hemoglobin: 8.9 g/dL — ABNORMAL LOW (ref 12.0–15.0)
Immature Granulocytes: 1 %
Lymphocytes Relative: 27 %
Lymphs Abs: 2.1 10*3/uL (ref 0.7–4.0)
MCH: 29.7 pg (ref 26.0–34.0)
MCHC: 32 g/dL (ref 30.0–36.0)
MCV: 92.7 fL (ref 80.0–100.0)
Monocytes Absolute: 0.5 10*3/uL (ref 0.1–1.0)
Monocytes Relative: 6 %
Neutro Abs: 5 10*3/uL (ref 1.7–7.7)
Neutrophils Relative %: 64 %
Platelets: 422 10*3/uL — ABNORMAL HIGH (ref 150–400)
RBC: 3 MIL/uL — ABNORMAL LOW (ref 3.87–5.11)
RDW: 14.8 % (ref 11.5–15.5)
WBC: 7.8 10*3/uL (ref 4.0–10.5)
nRBC: 0 % (ref 0.0–0.2)

## 2023-05-03 LAB — COMPREHENSIVE METABOLIC PANEL
ALT: 91 U/L — ABNORMAL HIGH (ref 0–44)
AST: 147 U/L — ABNORMAL HIGH (ref 15–41)
Albumin: 2.4 g/dL — ABNORMAL LOW (ref 3.5–5.0)
Alkaline Phosphatase: 504 U/L — ABNORMAL HIGH (ref 38–126)
Anion gap: 10 (ref 5–15)
BUN: 20 mg/dL (ref 6–20)
CO2: 16 mmol/L — ABNORMAL LOW (ref 22–32)
Calcium: 8.2 mg/dL — ABNORMAL LOW (ref 8.9–10.3)
Chloride: 113 mmol/L — ABNORMAL HIGH (ref 98–111)
Creatinine, Ser: 1.88 mg/dL — ABNORMAL HIGH (ref 0.44–1.00)
GFR, Estimated: 34 mL/min — ABNORMAL LOW (ref >60)
Glucose, Bld: 140 mg/dL — ABNORMAL HIGH (ref 70–99)
Potassium: 3.8 mmol/L (ref 3.5–5.1)
Sodium: 139 mmol/L (ref 135–145)
Total Bilirubin: 0.4 mg/dL (ref 0.0–1.2)
Total Protein: 5.7 g/dL — ABNORMAL LOW (ref 6.5–8.1)

## 2023-05-03 LAB — CULTURE, BLOOD (ROUTINE X 2)

## 2023-05-03 LAB — PROTIME-INR
INR: 1.5 — ABNORMAL HIGH (ref 0.8–1.2)
Prothrombin Time: 17.9 s — ABNORMAL HIGH (ref 11.4–15.2)

## 2023-05-03 LAB — GLUCOSE, CAPILLARY
Glucose-Capillary: 125 mg/dL — ABNORMAL HIGH (ref 70–99)
Glucose-Capillary: 136 mg/dL — ABNORMAL HIGH (ref 70–99)

## 2023-05-03 MED ORDER — OXYCODONE HCL 5 MG PO TABS
5.0000 mg | ORAL_TABLET | Freq: Once | ORAL | Status: AC
  Administered 2023-05-03: 5 mg via ORAL

## 2023-05-03 MED ORDER — OXYCODONE HCL 5 MG PO TABS: 5.0000 mg | ORAL_TABLET | Freq: Four times a day (QID) | ORAL | 0 refills | Status: DC | PRN

## 2023-05-03 MED ORDER — FUROSEMIDE 40 MG PO TABS: ORAL_TABLET | ORAL | 0 refills | Status: DC

## 2023-05-03 MED ORDER — ENOXAPARIN SODIUM 100 MG/ML IJ SOSY: 100.0000 mg | PREFILLED_SYRINGE | Freq: Two times a day (BID) | INTRAMUSCULAR | Status: DC

## 2023-05-03 MED ORDER — CARVEDILOL 3.125 MG PO TABS
3.1250 mg | ORAL_TABLET | Freq: Once | ORAL | Status: AC
  Administered 2023-05-03: 3.125 mg via ORAL
  Filled 2023-05-03: qty 1

## 2023-05-03 MED ORDER — ENOXAPARIN SODIUM 100 MG/ML IJ SOSY: 100.0000 mg | PREFILLED_SYRINGE | Freq: Two times a day (BID) | INTRAMUSCULAR | 0 refills | Status: DC

## 2023-05-03 MED ORDER — METOPROLOL SUCCINATE ER 50 MG PO TB24: 50.0000 mg | ORAL_TABLET | Freq: Every day | ORAL | 0 refills | Status: DC

## 2023-05-03 MED ORDER — WARFARIN SODIUM 7.5 MG PO TABS
7.5000 mg | ORAL_TABLET | Freq: Once | ORAL | Status: DC
Start: 2023-05-03 — End: 2023-05-03

## 2023-05-03 MED ORDER — CARVEDILOL 6.25 MG PO TABS: 6.2500 mg | ORAL_TABLET | Freq: Two times a day (BID) | ORAL | Status: DC

## 2023-05-03 MED ORDER — WARFARIN SODIUM 7.5 MG PO TABS
7.5000 mg | ORAL_TABLET | Freq: Once | ORAL | Status: DC
  Filled 2023-05-03: qty 1

## 2023-05-03 MED ORDER — SODIUM BICARBONATE 650 MG PO TABS: 650.0000 mg | ORAL_TABLET | Freq: Two times a day (BID) | ORAL | 0 refills | Status: DC

## 2023-05-03 MED ORDER — SODIUM BICARBONATE 650 MG PO TABS
650.0000 mg | ORAL_TABLET | Freq: Two times a day (BID) | ORAL | Status: DC
  Administered 2023-05-03: 650 mg via ORAL
  Filled 2023-05-03: qty 1

## 2023-05-03 MED ORDER — HYDRALAZINE HCL 25 MG PO TABS: 25.0000 mg | ORAL_TABLET | Freq: Three times a day (TID) | ORAL | Status: DC

## 2023-05-03 NOTE — Progress Notes (Signed)
Discharge instructions given. Patient verbalized understanding and all questions were answered.  ?

## 2023-05-03 NOTE — Progress Notes (Signed)
 PHARMACY - ANTICOAGULATION CONSULT NOTE  Pharmacy Consult for Warfarin and Lovenox   Indication:  CVA, severe PVD  Allergies  Allergen Reactions   Visipaque  [Iodixanol ] Nausea And Vomiting    Patient had vagal response during procedure became hypertensive, and bradycardic. CO2 used during procedure prior to contrast being used, this may be cause as well. Recommended premedicating prior to contrast being used in the future.    Wellbutrin [Bupropion] Hives   Gabapentin      dizzy   Cefepime  Rash    Tolerates penicilllin   Ciprofloxacin  Hcl Hives and Rash    Hives/rash at injection site; 01/15/22 tolerated IV cipro    Tape Rash    Patient Measurements: Height: 5' 5 (165.1 cm) Weight: 98.8 kg (217 lb 13 oz) IBW/kg (Calculated) : 57  Vital Signs: Temp: 98.5 F (36.9 C) (12/31 0417) Temp Source: Oral (12/31 0417) BP: 174/97 (12/31 0742) Pulse Rate: 89 (12/31 0742)  Labs: Recent Labs    04/30/23 0930 04/30/23 0930 05/01/23 0359 05/01/23 0520 05/02/23 0551 05/03/23 0304  HGB  --    < > 8.2*  --  8.3* 8.9*  HCT  --   --  24.8*  --  25.9* 27.8*  PLT  --   --  385  --  364 422*  LABPROT  --   --   --  16.4* 14.6 17.9*  INR  --   --   --  1.3* 1.1 1.5*  CREATININE 3.31*  --  2.71*  --  2.24* 1.88*  TROPONINIHS 7,045*  --  3,931*  --   --   --    < > = values in this interval not displayed.    Estimated Creatinine Clearance: 46.3 mL/min (A) (by C-G formula based on SCr of 1.88 mg/dL (H)).   Medical History: Past Medical History:  Diagnosis Date   Acute ischemic stroke (HCC) 01/01/2020   Acute kidney injury superimposed on CKD (HCC) 01/01/2020   Acute right MCA stroke (HCC) 01/28/2022   Anemia    CAD (coronary artery disease)    a. s/p cath in 03/2014 showing 30% mid-LAD, moderate to severe disease along small D1, patent LCx, moderate to severe distal OM2 stenosis and moderate diffuse diease along RCA not amenable to PCI   Cerebrovascular accident (CVA) (HCC) 12/31/2019    CHF (congestive heart failure) (HCC)    a. EF 55-60% in 12/2019 b. EF at 35-40% by echo in 05/2020   CKD (chronic kidney disease) stage 4, GFR 15-29 ml/min (HCC)    Diabetes mellitus without complication (HCC)    Myocardial infarction (HCC)    Neuropathy    PVD (peripheral vascular disease) (HCC)    Stroke (HCC) 01/2022   L hand weakness    Medications:  Medications Prior to Admission  Medication Sig Dispense Refill Last Dose/Taking   acetaminophen  (TYLENOL ) 325 MG tablet Take 1-2 tablets (325-650 mg total) by mouth every 4 (four) hours as needed for mild pain. (Patient taking differently: Take 650 mg by mouth as needed for mild pain (pain score 1-3).) 100 tablet 0 04/29/2023   atorvastatin  (LIPITOR) 40 MG tablet TAKE 1 TABLET BY MOUTH ONCE DAILY. 90 tablet 1 04/28/2023   clonazePAM  (KLONOPIN ) 1 MG disintegrating tablet Take 1 tablet (1 mg total) by mouth 2 (two) times daily as needed for seizure (For seizure lasting more than 2 minutes.). 60 tablet 0 Taking As Needed   clopidogrel  (PLAVIX ) 75 MG tablet Take 1 tablet (75 mg total) by mouth daily. 30 tablet  11 04/28/2023   furosemide  (LASIX ) 40 MG tablet TAKE 1 TABLET BY MOUTH ONCE DAILY AS NEEDED FOR  SWELLING 30 tablet 0 04/28/2023   hydrALAZINE  (APRESOLINE ) 25 MG tablet Take 1 tablet (25 mg total) by mouth in the morning and at bedtime.   04/28/2023   ibuprofen (ADVIL) 200 MG tablet Take 800 mg by mouth every 6 (six) hours as needed for moderate pain (pain score 4-6).   04/28/2023   insulin  aspart (NOVOLOG ) 100 UNIT/ML injection Use with Omnipod for daily dose around 100 units daily (Patient taking differently: Inject 0-200 Units into the skin See admin instructions. Load Omnipod with 200 units every 3-4 days.) 80 mL 3 04/29/2023   isosorbide  mononitrate (IMDUR ) 60 MG 24 hr tablet TAKE 2 TABLETS BY MOUTH ONCE DAILY. 60 tablet 1 04/28/2023   [EXPIRED] levofloxacin  (LEVAQUIN ) 500 MG tablet Take 1 tablet (500 mg total) by mouth every other  day for 3 doses. 3 tablet 0 04/28/2023   levothyroxine  (SYNTHROID ) 125 MCG tablet Take 1 tablet (125 mcg total) by mouth daily at 6 (six) AM. 90 tablet 3 04/28/2023   metoprolol  succinate (TOPROL -XL) 50 MG 24 hr tablet TAKE 1 TABLET BY MOUTH ONCE DAILY . APPOINTMENT REQUIRED FOR FUTURE REFILLS 15 tablet 0 04/28/2023   Multiple Vitamin (MULTIVITAMIN WITH MINERALS) TABS tablet Take 1 tablet by mouth daily.   Taking   nitroGLYCERIN  (NITROSTAT ) 0.4 MG SL tablet Place 1 tablet (0.4 mg total) under the tongue every 5 (five) minutes x 3 doses as needed for chest pain. 25 tablet 3 Taking As Needed   oxyCODONE -acetaminophen  (PERCOCET) 7.5-325 MG tablet Take 1 tablet by mouth daily as needed for severe pain. 30 tablet 0 Past Week   pantoprazole  (PROTONIX ) 40 MG tablet Take 1 tablet (40 mg total) by mouth daily. Courtesy fill/ pt to get established with a primary MD for further refills (Patient taking differently: Take 40 mg by mouth daily as needed (reflux).) 90 tablet 1 Past Month   ranolazine  (RANEXA ) 1000 MG SR tablet TAKE 1  BY MOUTH TWICE DAILY 60 tablet 0 04/28/2023   senna-docusate (SENOKOT-S) 8.6-50 MG tablet Take 1-2 tablets by mouth daily as needed for mild constipation. 30 tablet 0 Taking As Needed   tiZANidine  (ZANAFLEX ) 4 MG tablet Take 1 tablet (4 mg total) by mouth at bedtime. 30 tablet 1 04/28/2023   topiramate  (TOPAMAX ) 50 MG tablet Take 1 tablet (50 mg total) by mouth 2 (two) times daily. 60 tablet 3 04/28/2023   traZODone  (DESYREL ) 100 MG tablet Take 100 mg by mouth at bedtime.   04/28/2023   warfarin (COUMADIN ) 5 MG tablet TAKE ONE TABLET BY MOUTH DAILY EXCEPT 1/2 TABLET ON WEDNESDAYS AND SATURDAYS OR AS DIRECTED (Patient taking differently: Take 2.5-5 mg by mouth daily. Take 5mg  (1 tablet) daily, except on Tuesday, Thursday, and Saturday's take 2.5mg  (1/2 tablet).) 40 tablet 5 04/28/2023 at  9:00 PM   Continuous Glucose Sensor (DEXCOM G6 SENSOR) MISC CHANGE SENSOR EVERY 10 DAYS. 3 each 1     Continuous Glucose Transmitter (DEXCOM G6 TRANSMITTER) MISC Change transmitter every 90 days as directed. 1 each 3    Insulin  Disposable Pump (OMNIPOD 5 DEXG7G6 PODS GEN 5) MISC Change pod every 48-72 hours. 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 Patient admitted 12/27 with altered mental status in DKA and urinary sepsis. Noted to have elevated INR at 9.6  and oozing from femoral site. Vitamin K  5mg  IV x1 on 12/28 given and thrombi pad placed. Bleeding has resolved. Pharmacy consulted to resume Warfarin therapy. Lovenox  bridge started on 12/30 due to high risk of event with subtherapeutic INR per MD.   INR increased to 1.5, patient received 10mg  x1 on 12/29.  H/H low-stable. Platelets within normal limits. No further bleeding observed.   PTA Warfarin regimen: 2.5mg  Tuesday/Thursday/Saturday and 5 mg all other days.   Goal of Therapy:  INR 2-3 Monitor platelets by anticoagulation protocol: Yes   Plan:  Warfarin 7.5mg  po x1 tonight  Continue Lovenox  100mg  (1mg /kg) q12h.  Anticipate that INR in the AM will reflect increased dosing given the past 2 days.  Monitor daily PT/INR, CBC in AM.   Powell Blush, PharmD, BCCCP  Please refer to [facility] for [facility] numbers 05/03/2023,8:09 AM

## 2023-05-03 NOTE — Evaluation (Signed)
 Physical Therapy Evaluation Patient Details Name: Lauren [name] MRN: [id number] DOB: Dec 09, 1980 Today's Date: 05/03/2023  History of Present Illness  Pt is a 42 y.o. F who presents 04/29/2023 with DKA and acute metabolic encephalopathy due to DKA. Recent admits for DKA and UTI and 03/2023 with seizure. Significant PMH: DM1, seizures, CVA, CKD stage 4, NSTEMI, recent left proximal humerus fx after fall 12/23.  Clinical Impression  Pt admitted with above. Pt plans to discharge to her mother's handicap accessible apartment; reports she is in the process of filing disability and obtaining residence of her own. Pt presents with known left hemiplegia, decreased functional use of LUE, increased edema in BLE's and LUE, impaired standing balance. Pt able to perform hygiene ADL tasks with set up assist. Pt ambulating room distances with contact guard assist for safety due to increased lateral sway. Recommend use of single upper extremity support for balance and stability. Would benefit from HHPT to address deficits to address strength, balance, ROM, and activity tolerance.      If plan is discharge home, recommend the following: A little help with bathing/dressing/bathroom;Assistance with cooking/housework;Assist for transportation;Help with stairs or ramp for entrance   Can travel by private vehicle        Equipment Recommendations Other (comment) (quad cane)  Recommendations for Other Services       Functional Status Assessment Patient has had a recent decline in their functional status and demonstrates the ability to make significant improvements in function in a reasonable and predictable amount of time.     Precautions / Restrictions Precautions Precautions: Fall Required Braces or Orthoses: Sling Restrictions Weight Bearing Restrictions Per Provider Order: Yes LUE Weight Bearing Per Provider Order: Non weight bearing      Mobility  Bed Mobility               General bed  mobility comments: OOB in bathroom upon entry    Transfers Overall transfer level: Needs assistance Equipment used: None Transfers: Sit to/from Stand Sit to Stand: Supervision                Ambulation/Gait Ambulation/Gait assistance: Contact guard assist Gait Distance (Feet): 25 Feet Assistive device: None Gait Pattern/deviations: Step-through pattern, Decreased stride length, Wide base of support Gait velocity: decreased     General Gait Details: Increased lateral sway, wider BOS, CGA for safety  Stairs            Wheelchair Mobility     Tilt Bed    Modified Rankin (Stroke Patients Only)       Balance Overall balance assessment: Mild deficits observed, not formally tested                                           Pertinent Vitals/Pain Pain Assessment Pain Assessment: Faces Faces Pain Scale: Hurts a little bit Pain Location: LUE Pain Descriptors / Indicators: Discomfort, Guarding Pain Intervention(s): Limited activity within patient's tolerance, Monitored during session, Repositioned    Home Living Family/patient expects to be discharged to:: Private residence Living Arrangements: Parent (mother) Available Help at Discharge: Family;Available 24 hours/day Type of Home: Apartment Home Access: Level entry       Home Layout: One level Home Equipment: BSC/3in1;Tub bench;Rolling Walker (2 wheels);Wheelchair - manual;Grab bars - tub/shower;Shower seat Additional Comments: Pt plans to d/c to mother's handicapped accessible apartment; states she is working on getting disability and place  of her own    Prior Function Prior Level of Function : Needs assist             Mobility Comments: does not utilize AD with household ambulation       Extremity/Trunk Assessment   Upper Extremity Assessment Upper Extremity Assessment: Defer to OT evaluation    Lower Extremity Assessment Lower Extremity Assessment: RLE deficits/detail;LLE  deficits/detail RLE Deficits / Details: Increased edema. At least ant gravity strength LLE Deficits / Details: Increased edema. Hx residual deficits from prior stroke and toe amputations. At least anti gravity strength.    Cervical / Trunk Assessment Cervical / Trunk Assessment: Normal  Communication   Communication Communication: No apparent difficulties  Cognition Arousal: Alert Behavior During Therapy: Flat affect Overall Cognitive Status: No family/caregiver present to determine baseline cognitive functioning                                 General Comments: Pt with flat affect, follows all commands and answers questions appropriately. Does not recognize need to keep LUE elevated, stating, it's always swollen like that.        General Comments      Exercises     Assessment/Plan    PT Assessment Patient needs continued PT services  PT Problem List Decreased strength;Decreased activity tolerance;Decreased balance;Decreased mobility;Pain       PT Treatment Interventions DME instruction;Functional mobility training;Gait training;Therapeutic activities;Balance training;Therapeutic exercise;Patient/family education    PT Goals (Current goals can be found in the Care Plan section)  Acute Rehab PT Goals Patient Stated Goal: outpatient psych services PT Goal Formulation: With patient Time For Goal Achievement: 05/17/23 Potential to Achieve Goals: Good    Frequency Min 1X/week     Co-evaluation               AM-PAC PT 6 Clicks Mobility  Outcome Measure Help needed turning from your back to your side while in a flat bed without using bedrails?: None Help needed moving from lying on your back to sitting on the side of a flat bed without using bedrails?: None Help needed moving to and from a bed to a chair (including a wheelchair)?: A Little Help needed standing up from a chair using your arms (e.g., wheelchair or bedside chair)?: A Little Help  needed to walk in hospital room?: A Little Help needed climbing 3-5 steps with a railing? : A Lot 6 Click Score: 19    End of Session Equipment Utilized During Treatment: Other (comment) (sling) Activity Tolerance: Patient tolerated treatment well Patient left: in chair;with call bell/phone within reach   PT Visit Diagnosis: Unsteadiness on feet (R26.81);History of falling (Z91.81)    Time: 9071-9052 PT Time Calculation (min) (ACUTE ONLY): 19 min   Charges:   PT Evaluation $PT Eval Low Complexity: 1 Low   PT General Charges $$ ACUTE PT VISIT: 1 Visit         Lauren [name], PT, DPT Acute Rehabilitation Services Office [phone]   Lauren [name] 05/03/2023, 10:03 AM

## 2023-05-03 NOTE — Assessment & Plan Note (Signed)
 Chronic.

## 2023-05-03 NOTE — TOC Initial Note (Addendum)
 Transition of Care [facility]) - Initial/Assessment Note    Patient Details  Name: Lauren [name] MRN: [id number] Date of Birth: 20-Jan-1981  Transition of Care [facility]) CM/SW Contact:    Stephane Powell Jansky, RN Phone Number: 05/03/2023, 10:35 AM  Clinical Narrative:                 Spoke to patient at bedside.   Patient from home with husband and children. Patient plans to discharge to her mother's address which is [address] [place], [place], [place]. Patient has walker and wheelchair at home. PT recommending quad cane. Patient in agreement placed order and called Mitch with [facility].   PT recommending HHPT. Patient in agreement and has had [facility] in past and would like them again. Cindie with [facility] accepted referral for HHPT and HHRN ( [facility]KA)   NCM entered orders for home health and cane and secure chatted MD to sign   Mitch with [facility]  messaged said said she received a wheelchair in 2023 , insurance will not cover cane, they are going to  call her to offer private pay . Patient decided she did not want cane through [facility] . Mitch aware   Expected Discharge Plan: Home w Home Health Services Barriers to Discharge: No Barriers Identified   Patient Goals and CMS Choice Patient states their goals for this hospitalization and ongoing recovery are:: to return to home CMS Medicare.gov Compare Post Acute Care list provided to:: Patient Choice offered to / list presented to : Patient      Expected Discharge Plan and Services   Discharge Planning Services: CM Consult Post Acute Care Choice: Home Health, Durable Medical Equipment Living arrangements for the past 2 months: Apartment                 DME Arranged: [place]   Date DME Agency Contacted: 05/03/23 Time DME Agency Contacted: 1034 Representative spoke with at DME Agency: Mitch HH Arranged: RN, PT HH Agency: [facility] Date [facility] Agency Contacted: 05/03/23 Time HH Agency Contacted: 1034 Representative spoke with  at [facility] Agency: Cindie  Prior Living Arrangements/Services Living arrangements for the past 2 months: Apartment Lives with:: Spouse Patient language and need for interpreter reviewed:: Yes Do you feel safe going back to the place where you live?: Yes      Need for Family Participation in Patient Care: Yes (Comment) Care giver support system in place?: Yes (comment) Current home services: DME Criminal Activity/Legal Involvement Pertinent to Current Situation/Hospitalization: No - Comment as needed  Activities of Daily Living      Permission Sought/Granted   Permission granted to share information with : Yes, Verbal Permission Granted  Share Information with NAME: family at bedside  Permission granted to share info w AGENCY: [place] , [facility]        Emotional Assessment Appearance:: Appears stated age Attitude/Demeanor/Rapport: Engaged Affect (typically observed): Accepting Orientation: : Oriented to Self, Oriented to Place, Oriented to  Time, Oriented to Situation Alcohol  / Substance Use: Not Applicable Psych Involvement: No (comment)  Admission diagnosis:  Dehydration [E86.0] Bradycardia [R00.1] Abnormal ECG [R94.31] DKA (diabetic ketoacidosis) (HCC) [E11.10] ACS (acute coronary syndrome) (HCC) [I24.9] Elevated troponin [R79.89] Generalized weakness [R53.1] AKI (acute kidney injury) (HCC) [N17.9] 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 (HCC) [E08.11] Stage 4 chronic kidney disease (HCC) [N18.4] Hypotension due to hypovolemia [E86.1] Patient Active Problem List   Diagnosis Date Noted   Hemiparesis affecting left side as  late effect of cerebrovascular accident (CVA) (HCC) 05/03/2023   Left humeral fracture 05/02/2023   Elevated LFTs 05/02/2023   SEMI (subendocardial myocardial infarction) (HCC) 04/30/2023   DKA (diabetic ketoacidosis) (HCC) 04/21/2023   GERD (gastroesophageal reflux disease) 04/21/2023   Acute  metabolic encephalopathy 04/21/2023   Hypoalbuminemia due to protein-calorie malnutrition (HCC) 04/21/2023   Prolonged QT interval 04/21/2023   Seizure (HCC) 03/13/2023   Behavior concern in adult 03/06/2023   Chest pain due to CAD (HCC) 11/07/2022   Dizziness 10/11/2022   History of blood clots 09/29/2022   Abnormal TSH 09/13/2022   Lung nodule seen on imaging study 08/11/2022   Chronic nausea 08/02/2022   PVD (peripheral vascular disease) (HCC) 07/19/2022   DNR (do not resuscitate) 07/19/2022   Insomnia 04/07/2022   Ischemic cardiomyopathy    HFrEF (heart failure with reduced ejection fraction) /chronic systolic dysfunction CHF with EF of 30 to 35%    Right carotid artery occlusion 01/08/2022   Internal carotid artery occlusion, 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   Menorrhagia with irregular cycle 12/15/2021   Foot ulcer (HCC) 08/27/2021   Chronic combined systolic and diastolic heart failure (HCC) 06/18/2021   History of CVA (cerebrovascular accident) 06/18/2021   Menorrhagia with regular cycle 07/23/2020   Iron  deficiency anemia due to chronic blood loss 07/23/2020   Menstrual cramps 07/23/2020   Class 2 obesity 06/18/2020   CAD (coronary artery disease) 06/18/2020   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) - baseline Scr 2.8-3.0 06/18/2020   Angina at rest [facility]) 06/18/2020   Mixed hyperlipidemia    Class 2 obesity due to excess calories without serious comorbidity with body mass index (BMI) of 39.0 to 39.9 in adult    Acute kidney injury superimposed on stage 4 chronic kidney disease (HCC) 01/01/2020   Tobacco abuse 01/01/2020   Left sided numbness 12/31/2019   Vitamin D  deficiency 07/24/2015   Essential hypertension 03/17/2015   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) 03/07/2015   Acquired hypothyroidism 03/07/2015   PCP:  Melvenia Manus BRAVO, MD Pharmacy:   [facility] [address] [place] - [address] [place] [address] Phone:  [phone] Fax: [phone]  [name] [place], [place] - [address] [place] [address] Phone: [phone] Fax: [phone]     Social Drivers of Health (SDOH) Social History: SDOH Screenings   Food Insecurity: No Food Insecurity (05/01/2023)  Housing: Patient Declined (05/01/2023)  Transportation Needs: Patient Declined (05/01/2023)  Utilities: Patient Declined (05/01/2023)  Alcohol  Screen: Low Risk  (11/09/2022)  Depression (PHQ2-9): Low Risk  (02/18/2023)  Recent Concern: Depression (PHQ2-9) - Medium Risk (02/04/2023)  Financial Resource Strain: Low Risk  (11/09/2022)  Physical Activity: Inactive (03/03/2022)  Social Connections: Socially Integrated (03/03/2022)  Stress: No Stress Concern Present (03/03/2022)  Recent Concern: Stress - Stress Concern Present (12/22/2021)  Tobacco Use: Medium Risk (04/29/2023)   SDOH Interventions:     Readmission Risk Interventions    04/22/2023    3:05 PM 03/16/2023   11:32 AM 08/05/2022   11:08 AM  Readmission Risk Prevention Plan  Transportation Screening Complete Complete Complete  Medication Review Oceanographer) Complete Complete Complete  PCP or Specialist appointment within 3-5 days of discharge Not Complete Not Complete Complete  HRI or Home Care Consult Complete Complete   SW Recovery Care/Counseling Consult Complete Complete   Palliative Care Screening Not Applicable Not Applicable Not Applicable  Skilled Nursing Facility  Not Applicable Not Applicable Not Applicable

## 2023-05-03 NOTE — Assessment & Plan Note (Signed)
 05-03-2023 BMI 36.25

## 2023-05-03 NOTE — Consult Note (Signed)
 [facility] Liaison Consult Note    05/03/2023  Lauren [name] 1980/12/30 [id number]    [facility] Patient:  Active   Primary Care Provider:  Melvenia Manus BRAVO, MD With [facility] , this provider is listed to provide the community transition of care follow up and [facility] calls  Insurance: Blue Cross Blue Shield PPO  Patient has been active with [facility] for care coordination services.  Patient has been engaged by a [employer].  The community based plan of care has focused on disease management and community resource support.   Noted difficulty maintain contact.    Plan: Will reach out to [employer] [facility] of discharge.  Of note, [facility] services does not replace or interfere with any services that are needed or arranged by inpatient [facility] care management team.   Richerd Fish, RN, BSN, CCM [place]  [facility], [facility] Liaison Direct Dial: [phone] or secure chat Email: [email or web address]

## 2023-05-03 NOTE — Discharge Summary (Signed)
 [facility] Discharge Summary   Patient name: Lauren [name]  Admit date:     04/29/2023  Discharge date: 05/03/2023  Attending Physician: SHELAH LAMAR [name] [[id number]]  Discharge Physician: Lauren [name]   PCP: Lauren Manus BRAVO, MD  Admitted From: Home  Disposition:  Home  Recommendations for Outpatient Follow-up:  Follow up with PCP in 1-2 weeks Appointments will be made for orthopedic surgery, cardiology, CHF clinic, nephrology Patient will need repeat BMP and LFTs at PCP follow up visit.  If LFTs are back to baseline, she may restart lipitor 40 mg daily.  Home Health:Yes Home PT, RN Equipment/Devices: None    Discharge Condition:Stable CODE STATUS:FULL Diet recommendation: Renal/Diabetic Fluid Restriction: None  Hospital Summary: HPI: Pt encephalopathic, therefore HPI obtained from EMR review.   42yoF with PMH as below significant for type 1 DM (uses insulin  pump), seizures, CVA on coumadin , CKD stage 4 . Kown CAD with NSTEMI in July 2024. , CKD with baselin creat 3s. Recently seen in ER for left proximal humerus fx s/p fall 12/23.  Recent admits 12/19- 12/22 for DKA and UTI and 03/2023 with seizure, DKA, and AKI. Then 04/29/2023 - who presented with confusion and weakness from home to [facility].  CBG> 600.  Afebrile and hypotensive in ER.  Found to be in recurrent DKA with bicarb < 7, glucose 832, beta-hydroxybutyric acid > 8, K 6, sCr 3.95, pending CBC and UA Concern for sepsis w/ recent UTI, cultured and started on empiric vanc, flagyl , and aztreonam  started.  Insulin  gtt started.  CXR without acute process.  .  Remains hypotensive despite 3L LR, starting pressors. Pt to be transferred to [facility] for further ICU care.     Per EDP, verified prior code status with pt and her husband, verified no CPR/ intubation.   Significant Events: Admitted 04/29/2023 for DKA  ESTablisedh DNR - family very vocal about suing hospital if pt is intubated or CPR  -12/27/20244 PM at [facility], ICU in [place]: Daughter is indicated that the insulin  pump is working well and sugars were fairly under control on 04/28/2023.  This morning was unsteady and an hour later was extremely lethargic and taken to the ED.  In the ED apparently patient was hypotensive.  According to daughter patient probably received pressors.  Currently normotensive and not on pressors.  Patient's confirmed DNR but family worried about ongoing encephalopathy and obtundation.  Patient is afebrile.. RN at the ICU indicate peripheral line sites moves easily [patient is on Plavix  and Coumadin . Cardiology consultation for elevated troponin: Not a good cardiac catheterization candidate if troponin increases then consider IV heparin .  Avoid QT prolongation medications.  Hold GDMT with low blood pressure.  Patient's heart grossly volume overloaded. LEFT FEMORAL CVL PLACED IN ER 04/29/23 12/28: On room air, on insulin  infusion.  Appears to be off Levophed  infusion [in fact she was not at the time of arrival to the ICU] creatinine marginally improved.  Insulin  infusion rate is at 2.6 units/h [arrival yesterday was on 8 units].  Afebrile.  Is on antibiotics so far culture negative.  This morning more awake but ongoing confusion present according to nursing.   - Has purewick --urine appears discolored.  Mild fold smell in the perineal area present according to nursing. - Trop 2.6K -> 6.8K yesterday; pending today - BHOB - 0.1, gap 7, bic 21 this morning and all acceptable  INR 2.8 CARDS EVAL EF 35% global hypokienss Start low dose ASA and coreg . Not a candidate  for ACE/ARB/ARNI with significant renal failure  12/29 O n room air. AFebrile. BCID with staph epi. Oheerwise culture neg. CReat improved to 2.7mg . BHOB worse than 24h 0.1 > 0.82. TROP down to 3900. HHuge improivement in mental status. She is hungry and wants to eat    Antibiotic Therapy: Anti-infectives (From admission, onward)    Start     Dose/Rate Route  Frequency Ordered Stop   04/30/23 1230  linezolid  (ZYVOX ) IVPB 600 mg  Status:  Discontinued        600 mg 300 mL/hr over 60 Minutes Intravenous Every 12 hours 04/30/23 1131 05/01/23 0943   04/30/23 1030  cefTRIAXone  (ROCEPHIN ) 1 g in sodium chloride  0.9 % 100 mL IVPB        1 g 200 mL/hr over 30 Minutes Intravenous Every 24 hours 04/30/23 0938     04/29/23 1000  vancomycin  (VANCOCIN ) IVPB 1000 mg/200 mL premix        1,000 mg 200 mL/hr over 60 Minutes Intravenous Every 1 hr x 2 04/29/23 0942 04/29/23 1414   04/29/23 0945  aztreonam  (AZACTAM ) 2 g in sodium chloride  0.9 % 100 mL IVPB        2 g 200 mL/hr over 30 Minutes Intravenous  Once 04/29/23 0939 04/29/23 1205   04/29/23 0945  metroNIDAZOLE  (FLAGYL ) IVPB 500 mg        500 mg 100 mL/hr over 60 Minutes Intravenous  Once 04/29/23 0939 04/29/23 1313   04/29/23 0945  vancomycin  (VANCOCIN ) IVPB 1000 mg/200 mL premix  Status:  Discontinued        1,000 mg 200 mL/hr over 60 Minutes Intravenous  Once 04/29/23 9060 04/29/23 0942       Procedures:   Consultants: [facility] cardiology   Hospital Course by Problem: * DKA (diabetic ketoacidosis) (HCC) On admission, pt admitted to [facility] service and treated with DKA protocol with insulin  gtts. Pt's DKA resolved and pt changed over to SQ insulin . Pt's care transferred to [facility] on 05-02-2023.  05-02-2023 CBG stable. I asked pt to communicate to her family and have them bring her insulin  pump to hospital so that it could be restarted.  05-03-2023 spoke to pt's husband Lauren [name] via her cellphone. I asked him to bring a new omnipod to hospital so that it could be attached to patient prior to discharge.  05-03-2023 I verified that pt has a new ominpod and dexcomm sensor. Her omnipod is programmed for automatic administration. Review of her prior week insulin  usage, she averages about 28-34 units per day. Discussed with pt and husband that since she had 30 units of Lantus  today, that her omnipod may not  start giving her insulin  until tomorrow.  SEMI (subendocardial myocardial infarction) [facility]) On admission, pt seen by cardiology. She was diagnosed with subendocardial MI. Pt with known occluded CAD. Cardiology felt that pt did not have any targets for intervention given her prior LHC in 07-2022. Pt treated medically. Repeat echo 04-30-2023 showed stable LVEF of 30 to 35%.  05-02-2023 continue with medical management. Cardiology signed off her case on 05-01-2023. Per cards note 05-01-2023 Not a candidate for ACE/ARB/ARNI with significant renal failure. Continue with coreg  3.125 mg bid, ASA 81 mg qday. No statin due to elevated LFTs.  05-03-2023 LFTs continued to improve. She will need repeat LFTs in PCP office in 1-2 weeks. Then restart lipitor at that time.  05-03-2023 pt already on plavix  at home. Will stop ASA 81 at discharge. Continue with plavix  75 mg daily. Pt on  coumadin  for prior hx of CVA while taking Eliquis .  Left humeral fracture This occurred prior to admission. Her left shoulder Xr are from 04-25-2023. Pt admitted on 04-29-2023.  05-02-2023 continue with prn oxycodone . Will need ortho f/u. Keep left arm in sling.  05-03-2023 will send ambulatory referral to ortho in [place] for f/u of left humerus fracture.  Acute metabolic encephalopathy On admission, pt noted to be encephalopathic. This was felt to be due to her DKA.  This resolved with treatment of her DKA.  DNR (do not resuscitate) Pt made DNR on admission.  History of CVA (cerebrovascular accident) Pt on coumadin  for prior hx of CVA on Eliquis . Had been on lovenox  until dec 2023 when she was changed over to coumadin   05-02-2023 start lovenox  as a bridge to coumadin . INR 1.1 today. Pharmacy dosing coumadin .  05-03-2023 pt received vitamin K . On lovenox . Received 10 mg coumadin  last night. INR 1.5 today.  05-03-2023 discussed with pt and husband. Pt to stay on lovenox  100 mg sq bid x 3 days. Pt to take coumadin  5 mg  tonight. And then resume 2.5 mg T, Th, Sat and 5 mg M,W,F.Sunday. pt has been on lovenox  in the past, before she was changed over to Coumadin . Pt states she has supply of lovenox  at home. Pt to f/u with anticoag clinic for routine INR check.  Chronic combined systolic and diastolic heart failure (HCC) 05-02-2023 LVEF stable at 30-35%. Cardiology signed off 05-01-2023. Continue with coreg  3.125 mg bid only. Per cards, pt not a candidate for ARB/ACEI/ARNI due to CKD stage 4. If HTN becomes an issue, can add hydralazine  or long acting nitrates.  05-03-2023 BP up today to 174/97. HR 89. Will increase coreg  to 6.25 mg bid. She will need referral to CHF clinic.  05-03-2023 pt will resume Toprol -xl 50 mg daily at discharge. Referral made to CHF clinic for combined CHF and cards clinic for CAD. Refilled lasix  40 mg daily prn swelling.  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  Will refer to outpatient nephrology.   Class 2 obesity 05-03-2023 BMI 36.25  Essential hypertension 05-02-2023 continue with coreg .  Per cards, pt not a candidate for ARB/ACEI/ARNI due to CKD. If HTN becomes an issue, can add hydralazine  or long acting nitrates.  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 Until insulin  pump is restarted, continue with lantus  30 u qday, TID mealtime 2 units novolog   and SSI.  Hemiparesis affecting left side as late effect of cerebrovascular accident (CVA) (HCC) Chronic.  Elevated LFTs Likely due to shock liver from DKA, hypovolemia. Repeat LFTs. Hold statins for now.  05-03-2023 LFTs improving. AST 278 -->147, ALT 71-->91.  Will need repeat LFTs in PCP office in 1-2 weeks, then restart lipitor.  Acute kidney injury superimposed on stage 4 chronic kidney disease (HCC) Pt admitted with Scr 3.95.  baseline Scr 2.8-3.0. with treatment of her DKA, Scr has trended back to her baseline.  05-03-2023 Scr 1.88. will start bicarb 650 mg bid. She will need repeat BMP on PCP f/u appointment.  Nephrology referral made to [facility] for CKD stage 4.    Staph Epi from 1 out of 2 blood cx bottles was felt to be a contaminant and does not need to be treated.  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) - baseline Scr 2.8-3.0   Chronic combined systolic and diastolic heart failure (HCC)   History of CVA (cerebrovascular accident)   DNR (do not resuscitate)   Acute kidney injury superimposed on stage 4 chronic kidney disease (HCC)   Elevated LFTs   Hemiparesis affecting left side as late effect of cerebrovascular accident (CVA) [facility])   Discharge Instructions  Discharge Instructions     AMB referral to CHF clinic   Complete by: As directed    Reason for referral: Systolic HF   Ambulatory referral to Cardiology   Complete by: As directed    Combined chronic systolic/diastolic CHF EF 30-35%. CKD.   Ambulatory referral to Nephrology   Complete by: As directed    Type 1 DM, CKD stage 4   Ambulatory referral to Orthopedic Surgery   Complete by: As directed    F/u left humerus fracture   Call MD for:  difficulty breathing, headache or visual disturbances   Complete by: As directed    Call MD for:  extreme fatigue   Complete by: As directed    Call MD for:  persistant dizziness or light-headedness   Complete by: As directed    Call MD for:  persistant nausea and vomiting   Complete by: As directed    Call MD for:  temperature >100.4   Complete by: As directed    Diet - low  sodium heart healthy   Complete by: As directed    Discharge instructions   Complete by: As directed    1. Follow up with your primary care provider in 1-2 weeks following hospital discharge. 2. Arrangements will be made for you to see cardiology(heart specialist) and nephrologist(kidney specialist) as an outpatient. 3. Arrangements will be made for you to see an orthopedic surgeon regarding your broken left arm. 4. Continue to wear the left arm sling until you are seen by the orthopedic surgeon. 5. TAKE 5 MG OF COUMADIN  TONIGHT(05-03-2023). Then resume your normal coumadin  schedule of 2.5 mg on T, Th, Sat and 5 mg on M, W, F, Sunday.   Increase activity slowly   Complete by: As directed    No wound care   Complete by: As directed       Allergies as of 05/03/2023       Reactions   Visipaque  [iodixanol ] Nausea And Vomiting   Patient had vagal response during procedure became hypertensive, and bradycardic. CO2 used during procedure prior to contrast being used, this may be cause as well. Recommended premedicating prior to contrast being used in the future.    Wellbutrin [bupropion] Hives   Gabapentin     dizzy   Cefepime  Rash   Tolerates penicilllin   Ciprofloxacin  Hcl Hives, Rash   Hives/rash at injection site; 01/15/22 tolerated IV cipro    Tape Rash        Medication List     PAUSE taking these medications    atorvastatin  40 MG tablet Wait to take this until your doctor or other care provider tells you to start again. Commonly known as: LIPITOR TAKE 1 TABLET BY MOUTH ONCE DAILY.       STOP taking these medications    ibuprofen 200  MG tablet Commonly known as: ADVIL   levofloxacin  500 MG tablet Commonly known as: LEVAQUIN    oxyCODONE -acetaminophen  7.5-325 MG tablet Commonly known as: Percocet       TAKE these medications    acetaminophen  325 MG tablet Commonly known as: TYLENOL  Take 1-2 tablets (325-650 mg total) by mouth every 4 (four) hours as needed for  mild pain. What changed:  how much to take when to take this   clonazePAM  1 MG disintegrating tablet Commonly known as: KLONOPIN  Take 1 tablet (1 mg total) by mouth 2 (two) times daily as needed for seizure (For seizure lasting more than 2 minutes.).   clopidogrel  75 MG tablet Commonly known as: PLAVIX  Take 1 tablet (75 mg total) by mouth daily.   Dexcom G6 Sensor Misc CHANGE SENSOR EVERY 10 DAYS.   Dexcom G6 Transmitter Misc Change transmitter every 90 days as directed.   enoxaparin  100 MG/ML injection Commonly known as: LOVENOX  Inject 1 mL (100 mg total) into the skin 2 (two) times daily for 3 days.   furosemide  40 MG tablet Commonly known as: LASIX  TAKE 1 TABLET BY MOUTH ONCE DAILY AS NEEDED FOR  SWELLING   hydrALAZINE  25 MG tablet Commonly known as: APRESOLINE  Take 1 tablet (25 mg total) by mouth in the morning and at bedtime.   insulin  aspart 100 UNIT/ML injection Commonly known as: novoLOG  Use with Omnipod for daily dose around 100 units daily What changed:  how much to take how to take this when to take this additional instructions   isosorbide  mononitrate 60 MG 24 hr tablet Commonly known as: IMDUR  TAKE 2 TABLETS BY MOUTH ONCE DAILY.   levothyroxine  125 MCG tablet Commonly known as: SYNTHROID  Take 1 tablet (125 mcg total) by mouth daily at 6 (six) AM.   metoprolol  succinate 50 MG 24 hr tablet Commonly known as: TOPROL -XL Take 1 tablet (50 mg total) by mouth daily. Take with or immediately following a meal. What changed: See the new instructions.   multivitamin with minerals Tabs tablet Take 1 tablet by mouth daily.   nitroGLYCERIN  0.4 MG SL tablet Commonly known as: NITROSTAT  Place 1 tablet (0.4 mg total) under the tongue every 5 (five) minutes x 3 doses as needed for chest pain.   Omnipod 5 DexG7G6 Pods Gen 5 Misc Change pod every 48-72 hours.   oxyCODONE  5 MG immediate release tablet Commonly known as: Oxy IR/ROXICODONE  Take 1 tablet (5 mg  total) by mouth every 6 (six) hours as needed for moderate pain (pain score 4-6).   pantoprazole  40 MG tablet Commonly known as: PROTONIX  Take 1 tablet (40 mg total) by mouth daily. Courtesy fill/ pt to get established with a primary MD for further refills What changed:  when to take this reasons to take this   ranolazine  1000 MG SR tablet Commonly known as: RANEXA  TAKE 1  BY MOUTH TWICE DAILY   senna-docusate 8.6-50 MG tablet Commonly known as: Senokot-S Take 1-2 tablets by mouth daily as needed for mild constipation.   sodium bicarbonate  650 MG tablet Take 1 tablet (650 mg total) by mouth 2 (two) times daily.   tiZANidine  4 MG tablet Commonly known as: ZANAFLEX  Take 1 tablet (4 mg total) by mouth at bedtime.   topiramate  50 MG tablet Commonly known as: TOPAMAX  Take 1 tablet (50 mg total) by mouth 2 (two) times daily.   traZODone  100 MG tablet Commonly known as: DESYREL  Take 100 mg by mouth at bedtime.   warfarin 5 MG tablet Commonly  known as: COUMADIN  Take as directed. If you are unsure how to take this medication, talk to your nurse or doctor. Original instructions: TAKE ONE TABLET BY MOUTH DAILY EXCEPT 1/2 TABLET ON WEDNESDAYS AND SATURDAYS OR AS DIRECTED What changed:  how much to take how to take this when to take this additional instructions               Durable Medical Equipment  (From admission, onward)           Start     Ordered   05/03/23 1027  For home use only DME Cane  Once       Comments: Quad cane   05/03/23 1026            Follow-up Information     Care, [facility] Follow up.   Specialty: Home Health Services Contact information: [address] [place] [address] [phone]         Lauren Manus BRAVO, MD. Schedule an appointment as soon as possible for a visit.   Specialty: Internal Medicine Why: 1 to 2 weeks Contact information: [address] [place] [address] [phone]                 Allergies  Allergen Reactions   Visipaque  [Iodixanol ] Nausea And Vomiting    Patient had vagal response during procedure became hypertensive, and bradycardic. CO2 used during procedure prior to contrast being used, this may be cause as well. Recommended premedicating prior to contrast being used in the future.    Wellbutrin [Bupropion] Hives   Gabapentin      dizzy   Cefepime  Rash    Tolerates penicilllin   Ciprofloxacin  Hcl Hives and Rash    Hives/rash at injection site; 01/15/22 tolerated IV cipro    Tape Rash    Discharge Exam: Vitals:   05/03/23 0417 05/03/23 0742  BP: (!) 161/98 (!) 174/97  Pulse: 86 89  Resp: 18 17  Temp: 98.5 F (36.9 C)   SpO2: 100% 100%    Physical Exam Vitals and nursing note reviewed.  Constitutional:      General: She is not in acute distress.    Appearance: She is obese. She is not toxic-appearing or diaphoretic.  HENT:     Head: Normocephalic.     Nose: Nose normal.  Eyes:     General: No scleral icterus. Cardiovascular:     Rate and Rhythm: Normal rate and regular rhythm.  Pulmonary:     Effort: Pulmonary effort is normal.     Breath sounds: Normal breath sounds.  Abdominal:     General: Bowel sounds are normal.     Palpations: Abdomen is soft.     Comments: Pt with new omnipod and dexcomm sensor.  Musculoskeletal:     Right lower leg: Edema present.     Left lower leg: Edema present.     Comments: Left arm in sling.  Skin:    General: Skin is warm and dry.     Capillary Refill: Capillary refill takes less than 2 seconds.  Neurological:     Mental Status: She is alert and oriented to person, place, and time.     Comments: Chronic left hemiparesis.     The results of significant diagnostics from this hospitalization (including imaging, microbiology, ancillary and laboratory) are listed below for reference.    Microbiology: Recent Results (from the past 240 hours)  Resp panel by RT-PCR (RSV, Flu A&B, Covid)  Anterior  Nasal Swab     Status: None   Collection Time: 04/29/23  9:59 AM   Specimen: Anterior Nasal Swab  Result Value Ref Range Status   SARS Coronavirus 2 by RT PCR NEGATIVE NEGATIVE Final    Comment: (NOTE) SARS-CoV-2 target nucleic acids are NOT DETECTED.  The SARS-CoV-2 RNA is generally detectable in upper respiratory specimens during the acute phase of infection. The lowest concentration of SARS-CoV-2 viral copies this assay can detect is 138 copies/mL. A negative result does not preclude SARS-Cov-2 infection and should not be used as the sole basis for treatment or other patient management decisions. A negative result may occur with  improper specimen collection/handling, submission of specimen other than nasopharyngeal swab, presence of viral mutation(s) within the areas targeted by this assay, and inadequate number of viral copies(<138 copies/mL). A negative result must be combined with clinical observations, patient history, and epidemiological information. The expected result is Negative.  Fact Sheet for Patients:  bloggercourse.com  Fact Sheet for Healthcare Providers:  seriousbroker.it  This test is no t yet approved or cleared by the [place]  FDA and  has been authorized for detection and/or diagnosis of SARS-CoV-2 by FDA under an Emergency Use Authorization (EUA). This EUA will remain  in effect (meaning this test can be used) for the duration of the COVID-19 declaration under Section 564(b)(1) of the Act, 21 U.S.C.section 360bbb-3(b)(1), unless the authorization is terminated  or revoked sooner.       Influenza A by PCR NEGATIVE NEGATIVE Final   Influenza B by PCR NEGATIVE NEGATIVE Final    Comment: (NOTE) The Xpert Xpress SARS-CoV-2/FLU/RSV plus assay is intended as an aid in the diagnosis of influenza from Nasopharyngeal swab specimens and should not be used as a sole basis for treatment. Nasal washings  and aspirates are unacceptable for Xpert Xpress SARS-CoV-2/FLU/RSV testing.  Fact Sheet for Patients: bloggercourse.com  Fact Sheet for Healthcare Providers: seriousbroker.it  This test is not yet approved or cleared by the [place]  FDA and has been authorized for detection and/or diagnosis of SARS-CoV-2 by FDA under an Emergency Use Authorization (EUA). This EUA will remain in effect (meaning this test can be used) for the duration of the COVID-19 declaration under Section 564(b)(1) of the Act, 21 U.S.C. section 360bbb-3(b)(1), unless the authorization is terminated or revoked.     Resp Syncytial Virus by PCR NEGATIVE NEGATIVE Final    Comment: (NOTE) Fact Sheet for Patients: bloggercourse.com  Fact Sheet for Healthcare Providers: seriousbroker.it  This test is not yet approved or cleared by the [place]  FDA and has been authorized for detection and/or diagnosis of SARS-CoV-2 by FDA under an Emergency Use Authorization (EUA). This EUA will remain in effect (meaning this test can be used) for the duration of the COVID-19 declaration under Section 564(b)(1) of the Act, 21 U.S.C. section 360bbb-3(b)(1), unless the authorization is terminated or revoked.  Performed at [facility], [address] [place], [place] [address]   Blood Culture (routine x 2)     Status: Abnormal   Collection Time: 04/29/23 11:23 AM   Specimen: BLOOD  Result Value Ref Range Status   Specimen Description   Final    BLOOD LEFT FEMORAL ARTERY Performed at [facility], [address] [place], [place] [address]    Special Requests   Final    BOTTLES DRAWN AEROBIC AND ANAEROBIC Blood Culture results may not be optimal due to an inadequate volume of blood received in culture bottles Performed at [facility], [address], [place], [place] [address]    Culture  Setup Time   Final    GRAM  POSITIVE COCCI AEROBIC BOTTLE ONLY Gram Stain Report Called to,Read Back By and Verified With: A PUGH AT [facility] AT 1122 ON [id number] BY S [place] CRITICAL RESULT CALLED TO, READ BACK BY AND VERIFIED WITH: PHARMD J LEDFORD 05/01/2023 @ 0016 BY AB    Culture (A)  Final    STAPHYLOCOCCUS EPIDERMIDIS THE SIGNIFICANCE OF ISOLATING THIS ORGANISM FROM A SINGLE VENIPUNCTURE CANNOT BE PREDICTED WITHOUT FURTHER CLINICAL AND CULTURE CORRELATION. SUSCEPTIBILITIES AVAILABLE ONLY ON REQUEST. Performed at [facility], [address], [place], [place] [address]    Report Status 05/03/2023 FINAL  Final  Blood Culture ID Panel (Reflexed)     Status: Abnormal   Collection Time: 04/29/23 11:23 AM  Result Value Ref Range Status   Enterococcus faecalis NOT DETECTED NOT DETECTED Final   Enterococcus Faecium NOT DETECTED NOT DETECTED Final   Listeria monocytogenes NOT DETECTED NOT DETECTED Final   Staphylococcus species DETECTED (A) NOT DETECTED Final    Comment: CRITICAL RESULT CALLED TO, READ BACK BY AND VERIFIED WITH: PHARMD J LEDFORD 05/01/2023 @ 0016 BY AB    Staphylococcus aureus (BCID) NOT DETECTED NOT DETECTED Final   Staphylococcus epidermidis DETECTED (A) NOT DETECTED Final    Comment: Methicillin (oxacillin) resistant coagulase negative staphylococcus. Possible blood culture contaminant (unless isolated from more than one blood culture draw or clinical case suggests pathogenicity). No antibiotic treatment is indicated for blood  culture contaminants. CRITICAL RESULT CALLED TO, READ BACK BY AND VERIFIED WITH: PHARMD J LEDFORD 05/01/2023 @ 0016 BY AB    Staphylococcus lugdunensis NOT DETECTED NOT DETECTED Final   Streptococcus species NOT DETECTED NOT DETECTED Final   Streptococcus agalactiae NOT DETECTED NOT DETECTED Final   Streptococcus pneumoniae NOT DETECTED NOT DETECTED Final   Streptococcus pyogenes NOT DETECTED NOT DETECTED Final   A.calcoaceticus-baumannii NOT DETECTED NOT DETECTED Final    Bacteroides fragilis NOT DETECTED NOT DETECTED Final   Enterobacterales NOT DETECTED NOT DETECTED Final   Enterobacter cloacae complex NOT DETECTED NOT DETECTED Final   Escherichia coli NOT DETECTED NOT DETECTED Final   Klebsiella aerogenes NOT DETECTED NOT DETECTED Final   Klebsiella oxytoca NOT DETECTED NOT DETECTED Final   Klebsiella pneumoniae NOT DETECTED NOT DETECTED Final   Proteus species NOT DETECTED NOT DETECTED Final   Salmonella species NOT DETECTED NOT DETECTED Final   Serratia marcescens NOT DETECTED NOT DETECTED Final   Haemophilus influenzae NOT DETECTED NOT DETECTED Final   Neisseria meningitidis NOT DETECTED NOT DETECTED Final   Pseudomonas aeruginosa NOT DETECTED NOT DETECTED Final   Stenotrophomonas maltophilia NOT DETECTED NOT DETECTED Final   Candida albicans NOT DETECTED NOT DETECTED Final   Candida auris NOT DETECTED NOT DETECTED Final   Candida glabrata NOT DETECTED NOT DETECTED Final   Candida krusei NOT DETECTED NOT DETECTED Final   Candida parapsilosis NOT DETECTED NOT DETECTED Final   Candida tropicalis NOT DETECTED NOT DETECTED Final   Cryptococcus neoformans/gattii NOT DETECTED NOT DETECTED Final   Methicillin resistance mecA/C DETECTED (A) NOT DETECTED Final    Comment: CRITICAL RESULT CALLED TO, READ BACK BY AND VERIFIED WITH: PHARMD J LEDFORD 05/01/2023 @ 0016 BY AB Performed at [facility], [address] [place], [place] [address]   Culture, blood (Routine X 2) w Reflex to ID Panel     Status: None (Preliminary result)   Collection Time: 04/29/23  5:03 PM  Specimen: BLOOD  Result Value Ref Range Status   Specimen Description BLOOD SITE NOT SPECIFIED  Final   Special Requests   Final    BOTTLES DRAWN AEROBIC ONLY Blood Culture results may not be optimal due to an inadequate volume of blood received in culture bottles   Culture   Final    NO GROWTH 3 DAYS Performed at [facility], [address], [place], [place] [address]     Report Status PENDING  Incomplete  Urine Culture (for pregnant, neutropenic or urologic patients or patients with an indwelling urinary catheter)     Status: Abnormal   Collection Time: 04/30/23  9:11 AM   Specimen: Urine, Bag (ped)  Result Value Ref Range Status   Specimen Description URINE, BAG PED  Final   Special Requests NONE  Final   Culture (A)  Final    <10,000 COLONIES/mL INSIGNIFICANT GROWTH Performed at [facility], [address], [place], [place] [address]    Report Status 05/01/2023 FINAL  Final  MRSA Next Gen by PCR, Nasal     Status: None   Collection Time: 04/30/23  9:36 AM   Specimen: Nasal Mucosa; Nasal Swab  Result Value Ref Range Status   MRSA by PCR Next Gen NOT DETECTED NOT DETECTED Final    Comment: (NOTE) The GeneXpert MRSA Assay (FDA approved for NASAL specimens only), is one component of a comprehensive MRSA colonization surveillance program. It is not intended to diagnose MRSA infection nor to guide or monitor treatment for MRSA infections. Test performance is not FDA approved in patients less than 48 years old. Performed at [facility], [address], [place], [place] [address]    Labs:  Basic Metabolic Panel: Recent Labs  Lab 04/29/23 1958 04/29/23 2218 04/30/23 0446 04/30/23 0930 05/01/23 0359 05/02/23 0551 05/03/23 0304  NA 131*   < > 137 135 137 140 139  K 4.1   < > 3.4* 3.4* 3.3* 3.3* 3.8  CL 101   < > 109 108 111 112* 113*  CO2 14*   < > 21* 17* 18* 19* 16*  GLUCOSE 611*   < > 194* 169* 189* 132* 140*  BUN 45*   < > 43* 44* 34* 24* 20  CREATININE 3.95*   < > 3.39* 3.31* 2.71* 2.24* 1.88*  CALCIUM  7.5*   < > 7.7* 7.6* 7.6* 7.9* 8.2*  MG 1.9  --   --  1.8 2.3 2.0  --   PHOS  --   --   --  3.3 2.8 2.6  --    < > = values in this interval not displayed.   Liver Function Tests: Recent Labs  Lab 04/29/23 1128 04/30/23 0446 05/01/23 0359 05/02/23 0551 05/03/23 0304  AST 25 36 28 278* 147*  ALT 20 24 21  71* 91*   ALKPHOS 74 80 76 356* 504*  BILITOT 1.6* 0.5 0.6 0.8 0.4  PROT 4.8* 4.6* 4.4* 4.7* 5.7*  ALBUMIN  2.0* 2.0* 1.8* 2.0* 2.4*   CBC: Recent Labs  Lab 04/29/23 2218 04/30/23 0446 05/01/23 0359 05/02/23 0551 05/03/23 0304  WBC 16.2* 21.2* 14.1* 7.7 7.8  NEUTROABS 14.2*  --   --   --  5.0  HGB 8.5* 8.3* 8.2* 8.3* 8.9*  HCT 25.5* 24.9* 24.8* 25.9* 27.8*  MCV 89.8 89.2 90.2 92.5 92.7  PLT 351 383 385 364 422*   CBG: Recent Labs  Lab 05/02/23 1225 05/02/23 1701 05/02/23 2029 05/03/23 0610 05/03/23  0742  GLUCAP 160* 110* 148* 125* 136*   Urinalysis    Component Value Date/Time   COLORURINE YELLOW 04/30/2023 0110   APPEARANCEUR CLEAR 04/30/2023 0110   LABSPEC 1.008 04/30/2023 0110   PHURINE 5.0 04/30/2023 0110   GLUCOSEU >=500 (A) 04/30/2023 0110   HGBUR LARGE (A) 04/30/2023 0110   BILIRUBINUR NEGATIVE 04/30/2023 0110   KETONESUR 5 (A) 04/30/2023 0110   PROTEINUR 100 (A) 04/30/2023 0110   UROBILINOGEN 0.2 06/23/2014 1227   NITRITE NEGATIVE 04/30/2023 0110   LEUKOCYTESUR NEGATIVE 04/30/2023 0110   Sepsis Labs Recent Labs  Lab 04/30/23 0446 05/01/23 0359 05/02/23 0551 05/03/23 0304  WBC 21.2* 14.1* 7.7 7.8   Microbiology Recent Results (from the past 240 hours)  Resp panel by RT-PCR (RSV, Flu A&B, Covid) Anterior Nasal Swab     Status: None   Collection Time: 04/29/23  9:59 AM   Specimen: Anterior Nasal Swab  Result Value Ref Range Status   SARS Coronavirus 2 by RT PCR NEGATIVE NEGATIVE Final    Comment: (NOTE) SARS-CoV-2 target nucleic acids are NOT DETECTED.  The SARS-CoV-2 RNA is generally detectable in upper respiratory specimens during the acute phase of infection. The lowest concentration of SARS-CoV-2 viral copies this assay can detect is 138 copies/mL. A negative result does not preclude SARS-Cov-2 infection and should not be used as the sole basis for treatment or other patient management decisions. A negative result may occur with  improper  specimen collection/handling, submission of specimen other than nasopharyngeal swab, presence of viral mutation(s) within the areas targeted by this assay, and inadequate number of viral copies(<138 copies/mL). A negative result must be combined with clinical observations, patient history, and epidemiological information. The expected result is Negative.  Fact Sheet for Patients:  bloggercourse.com  Fact Sheet for Healthcare Providers:  seriousbroker.it  This test is no t yet approved or cleared by the [place]  FDA and  has been authorized for detection and/or diagnosis of SARS-CoV-2 by FDA under an Emergency Use Authorization (EUA). This EUA will remain  in effect (meaning this test can be used) for the duration of the COVID-19 declaration under Section 564(b)(1) of the Act, 21 U.S.C.section 360bbb-3(b)(1), unless the authorization is terminated  or revoked sooner.       Influenza A by PCR NEGATIVE NEGATIVE Final   Influenza B by PCR NEGATIVE NEGATIVE Final    Comment: (NOTE) The Xpert Xpress SARS-CoV-2/FLU/RSV plus assay is intended as an aid in the diagnosis of influenza from Nasopharyngeal swab specimens and should not be used as a sole basis for treatment. Nasal washings and aspirates are unacceptable for Xpert Xpress SARS-CoV-2/FLU/RSV testing.  Fact Sheet for Patients: bloggercourse.com  Fact Sheet for Healthcare Providers: seriousbroker.it  This test is not yet approved or cleared by the [place]  FDA and has been authorized for detection and/or diagnosis of SARS-CoV-2 by FDA under an Emergency Use Authorization (EUA). This EUA will remain in effect (meaning this test can be used) for the duration of the COVID-19 declaration under Section 564(b)(1) of the Act, 21 U.S.C. section 360bbb-3(b)(1), unless the authorization is terminated or revoked.     Resp  Syncytial Virus by PCR NEGATIVE NEGATIVE Final    Comment: (NOTE) Fact Sheet for Patients: bloggercourse.com  Fact Sheet for Healthcare Providers: seriousbroker.it  This test is not yet approved or cleared by the [place]  FDA and has been authorized for detection and/or diagnosis of SARS-CoV-2 by FDA under an Emergency Use Authorization (EUA). This EUA will remain in  effect (meaning this test can be used) for the duration of the COVID-19 declaration under Section 564(b)(1) of the Act, 21 U.S.C. section 360bbb-3(b)(1), unless the authorization is terminated or revoked.  Performed at [facility], [address] [place], [place] [address]   Blood Culture (routine x 2)     Status: Abnormal   Collection Time: 04/29/23 11:23 AM   Specimen: BLOOD  Result Value Ref Range Status   Specimen Description   Final    BLOOD LEFT FEMORAL ARTERY Performed at [facility], [address] [place], [place] [address]    Special Requests   Final    BOTTLES DRAWN AEROBIC AND ANAEROBIC Blood Culture results may not be optimal due to an inadequate volume of blood received in culture bottles Performed at [facility], [address], [place], [place] [address]    Culture  Setup Time   Final    GRAM POSITIVE COCCI AEROBIC BOTTLE ONLY Gram Stain Report Called to,Read Back By and Verified With: A PUGH AT [facility] AT 1122 ON [id number] BY S [place] CRITICAL RESULT CALLED TO, READ BACK BY AND VERIFIED WITH: PHARMD J LEDFORD 05/01/2023 @ 0016 BY AB    Culture (A)  Final    STAPHYLOCOCCUS EPIDERMIDIS THE SIGNIFICANCE OF ISOLATING THIS ORGANISM FROM A SINGLE VENIPUNCTURE CANNOT BE PREDICTED WITHOUT FURTHER CLINICAL AND CULTURE CORRELATION. SUSCEPTIBILITIES AVAILABLE ONLY ON REQUEST. Performed at [facility], [address] [place], [place] [address]    Report Status 05/03/2023 FINAL  Final  Blood Culture ID Panel (Reflexed)     Status: Abnormal    Collection Time: 04/29/23 11:23 AM  Result Value Ref Range Status   Enterococcus faecalis NOT DETECTED NOT DETECTED Final   Enterococcus Faecium NOT DETECTED NOT DETECTED Final   Listeria monocytogenes NOT DETECTED NOT DETECTED Final   Staphylococcus species DETECTED (A) NOT DETECTED Final    Comment: CRITICAL RESULT CALLED TO, READ BACK BY AND VERIFIED WITH: PHARMD J LEDFORD 05/01/2023 @ 0016 BY AB    Staphylococcus aureus (BCID) NOT DETECTED NOT DETECTED Final   Staphylococcus epidermidis DETECTED (A) NOT DETECTED Final    Comment: Methicillin (oxacillin) resistant coagulase negative staphylococcus. Possible blood culture contaminant (unless isolated from more than one blood culture draw or clinical case suggests pathogenicity). No antibiotic treatment is indicated for blood  culture contaminants. CRITICAL RESULT CALLED TO, READ BACK BY AND VERIFIED WITH: PHARMD J LEDFORD 05/01/2023 @ 0016 BY AB    Staphylococcus lugdunensis NOT DETECTED NOT DETECTED Final   Streptococcus species NOT DETECTED NOT DETECTED Final   Streptococcus agalactiae NOT DETECTED NOT DETECTED Final   Streptococcus pneumoniae NOT DETECTED NOT DETECTED Final   Streptococcus pyogenes NOT DETECTED NOT DETECTED Final   A.calcoaceticus-baumannii NOT DETECTED NOT DETECTED Final   Bacteroides fragilis NOT DETECTED NOT DETECTED Final   Enterobacterales NOT DETECTED NOT DETECTED Final   Enterobacter cloacae complex NOT DETECTED NOT DETECTED Final   Escherichia coli NOT DETECTED NOT DETECTED Final   Klebsiella aerogenes NOT DETECTED NOT DETECTED Final   Klebsiella oxytoca NOT DETECTED NOT DETECTED Final   Klebsiella pneumoniae NOT DETECTED NOT DETECTED Final   Proteus species NOT DETECTED NOT DETECTED Final   Salmonella species NOT DETECTED NOT DETECTED Final   Serratia marcescens NOT DETECTED NOT DETECTED Final   Haemophilus influenzae NOT DETECTED NOT DETECTED Final   Neisseria meningitidis NOT DETECTED NOT DETECTED  Final   Pseudomonas aeruginosa NOT DETECTED NOT DETECTED Final   Stenotrophomonas maltophilia NOT DETECTED NOT DETECTED Final  Candida albicans NOT DETECTED NOT DETECTED Final   Candida auris NOT DETECTED NOT DETECTED Final   Candida glabrata NOT DETECTED NOT DETECTED Final   Candida krusei NOT DETECTED NOT DETECTED Final   Candida parapsilosis NOT DETECTED NOT DETECTED Final   Candida tropicalis NOT DETECTED NOT DETECTED Final   Cryptococcus neoformans/gattii NOT DETECTED NOT DETECTED Final   Methicillin resistance mecA/C DETECTED (A) NOT DETECTED Final    Comment: CRITICAL RESULT CALLED TO, READ BACK BY AND VERIFIED WITH: PHARMD J LEDFORD 05/01/2023 @ 0016 BY AB Performed at [facility], [address], [place], [place] [address]   Culture, blood (Routine X 2) w Reflex to ID Panel     Status: None (Preliminary result)   Collection Time: 04/29/23  5:03 PM   Specimen: BLOOD  Result Value Ref Range Status   Specimen Description BLOOD SITE NOT SPECIFIED  Final   Special Requests   Final    BOTTLES DRAWN AEROBIC ONLY Blood Culture results may not be optimal due to an inadequate volume of blood received in culture bottles   Culture   Final    NO GROWTH 3 DAYS Performed at [facility], [address] [place], [place] [address]    Report Status PENDING  Incomplete  Urine Culture (for pregnant, neutropenic or urologic patients or patients with an indwelling urinary catheter)     Status: Abnormal   Collection Time: 04/30/23  9:11 AM   Specimen: Urine, Bag (ped)  Result Value Ref Range Status   Specimen Description URINE, BAG PED  Final   Special Requests NONE  Final   Culture (A)  Final    <10,000 COLONIES/mL INSIGNIFICANT GROWTH Performed at [facility], [address], [place], [place] [address]    Report Status 05/01/2023 FINAL  Final  MRSA Next Gen by PCR, Nasal     Status: None   Collection Time: 04/30/23  9:36 AM   Specimen: Nasal Mucosa; Nasal Swab   Result Value Ref Range Status   MRSA by PCR Next Gen NOT DETECTED NOT DETECTED Final    Comment: (NOTE) The GeneXpert MRSA Assay (FDA approved for NASAL specimens only), is one component of a comprehensive MRSA colonization surveillance program. It is not intended to diagnose MRSA infection nor to guide or monitor treatment for MRSA infections. Test performance is not FDA approved in patients less than 66 years old. Performed at [facility], [address] [place], [place] [address]     Procedures/Studies: US  RENAL Result Date: 04/30/2023 CLINICAL DATA:  Acute on chronic renal failure EXAM: RENAL / URINARY TRACT ULTRASOUND COMPLETE COMPARISON:  01/10/2022, 08/03/2022 FINDINGS: Right Kidney: Renal measurements: 9.1 x 4.9 x 6.0 cm = volume: 138.4 mL. Normal renal echotexture. Nonobstructing 6 mm calculus lower pole right kidney. No hydronephrosis. Left Kidney: Not visualized due to overlying bowel gas. Bladder: Appears normal for degree of bladder distention. Other: None. IMPRESSION: 1. Nonvisualization of the left kidney due to poor acoustic window and overlying bowel gas. 2. Nonobstructing 6 mm right renal calculus. Otherwise unremarkable right kidney. Electronically Signed   By: Ozell Daring M.D.   On: 04/30/2023 17:25   ECHOCARDIOGRAM COMPLETE Result Date: 04/30/2023    ECHOCARDIOGRAM REPORT   Patient Name:   SHAIMA SARDINAS Date of Exam: 04/30/2023 Medical Rec #:  [id number]       Height:       65.0 in Accession #:    [id number]  Weight:       218.5 lb Date of Birth:  11/04/1980       BSA:          2.054 m Patient Age:    42 years        BP:           134/73 mmHg Patient Gender: F               HR:           108 bpm. Exam Location:  Inpatient Procedure: 2D Echo, Color Doppler, Cardiac Doppler and Intracardiac            Opacification Agent Indications:    Elevated troponin  History:        Patient has prior history of Echocardiogram examinations, most                 recent  07/24/2022.  Sonographer:    Tinnie Gosling RDCS Referring Phys: [id number] ROLLO JONELLE LOUDER IMPRESSIONS  1. Left ventricular ejection fraction, by estimation, is 30 to 35%. The left ventricle has moderately decreased function. The left ventricle demonstrates global hypokinesis. The left ventricular internal cavity size was moderately dilated. Left ventricular diastolic parameters are indeterminate.  2. Right ventricular systolic function is normal. The right ventricular size is normal.  3. Left atrial size was mildly dilated.  4. The mitral valve is normal in structure. Trivial mitral valve regurgitation. No evidence of mitral stenosis.  5. The aortic valve is tricuspid. Aortic valve regurgitation is not visualized. No aortic stenosis is present.  6. The inferior vena cava is normal in size with greater than 50% respiratory variability, suggesting right atrial pressure of 3 mmHg. FINDINGS  Left Ventricle: Left ventricular ejection fraction, by estimation, is 30 to 35%. The left ventricle has moderately decreased function. The left ventricle demonstrates global hypokinesis. Definity  contrast agent was given IV to delineate the left ventricular endocardial borders. The left ventricular internal cavity size was moderately dilated. There is no left ventricular hypertrophy. Left ventricular diastolic parameters are indeterminate. Right Ventricle: The right ventricular size is normal. No increase in right ventricular wall thickness. Right ventricular systolic function is normal. Left Atrium: Left atrial size was mildly dilated. Right Atrium: Right atrial size was normal in size. Pericardium: Trivial pericardial effusion is present. The pericardial effusion is posterior to the left ventricle. Mitral Valve: The mitral valve is normal in structure. Trivial mitral valve regurgitation. No evidence of mitral valve stenosis. Tricuspid Valve: The tricuspid valve is normal in structure. Tricuspid valve regurgitation is mild . No  evidence of tricuspid stenosis. Aortic Valve: The aortic valve is tricuspid. Aortic valve regurgitation is not visualized. No aortic stenosis is present. Pulmonic Valve: The pulmonic valve was normal in structure. Pulmonic valve regurgitation is not visualized. No evidence of pulmonic stenosis. Aorta: The aortic root is normal in size and structure. Venous: The inferior vena cava is normal in size with greater than 50% respiratory variability, suggesting right atrial pressure of 3 mmHg. IAS/Shunts: No atrial level shunt detected by color flow Doppler.  LEFT VENTRICLE PLAX 2D LVIDd:         5.30 cm      Diastology LVIDs:         4.00 cm      LV e' lateral: 11.50 cm/s LV PW:         1.00 cm LV IVS:        1.00 cm LVOT diam:     1.70  cm LV SV:         35 LV SV Index:   17 LVOT Area:     2.27 cm  LV Volumes (MOD) LV vol d, MOD A2C: 186.0 ml LV vol d, MOD A4C: 252.0 ml LV vol s, MOD A2C: 96.3 ml LV vol s, MOD A4C: 161.0 ml LV SV MOD A2C:     89.7 ml LV SV MOD A4C:     252.0 ml LV SV MOD BP:      92.2 ml LEFT ATRIUM             Index LA diam:        3.90 cm 1.90 cm/m LA Vol (A2C):   24.1 ml 11.73 ml/m LA Vol (A4C):   52.9 ml 25.76 ml/m LA Biplane Vol: 37.3 ml 18.16 ml/m  AORTIC VALVE LVOT Vmax:   85.40 cm/s LVOT Vmean:  55.200 cm/s LVOT VTI:    0.155 m  AORTA Ao Root diam: 3.10 cm Ao Asc diam:  2.70 cm  SHUNTS Systemic VTI:  0.16 m Systemic Diam: 1.70 cm Maude Emmer MD Electronically signed by Maude Emmer MD Signature Date/Time: 04/30/2023/2:20:11 PM    Final    DG Chest Port 1 View Result Date: 04/29/2023 CLINICAL DATA:  Weakness. EXAM: PORTABLE CHEST 1 VIEW COMPARISON:  Chest radiograph dated April 21, 2023. FINDINGS: Low lung volumes with accentuation of the cardiomediastinal silhouette. No focal consolidation. The left costophrenic angle is not completely included within the field of view. No sizable pleural effusion or pneumothorax identified. No acute osseous abnormality. IMPRESSION: Low lung volume.   No acute cardiopulmonary findings. Electronically Signed   By: Harrietta Sherry M.D.   On: 04/29/2023 10:34   DG Shoulder Left Result Date: 04/25/2023 CLINICAL DATA:  Pain after fall EXAM: LEFT SHOULDER - 2 VIEW; LEFT WRIST - COMPLETE 4 VIEW; LEFT ELBOW - 2 VIEW COMPARISON:  Left thumb radiographs October 11, 2022 FINDINGS: There is a comminuted and minimally displaced fracture of the surgical neck of the left humerus. There is no definite intra-articular extension. There is moderate overlying soft tissue swelling. No definite additional acute fracture is visualized. However, there is limited assessment for elbow joint effusion due to limitations in patient positioning. Partially visualized healed fractures of the proximal and distal phalanxes of the first digit. No dislocation.  The joint spaces are well-maintained. Diffuse vascular calcifications. IMPRESSION: 1. Comminuted and minimally displaced left humeral neck fracture. 2. No definite additional acute fracture is visualized in the left elbow or wrist. Electronically Signed   By: Dirk Arrant M.D.   On: 04/25/2023 10:14   DG Wrist Complete Left Result Date: 04/25/2023 CLINICAL DATA:  Pain after fall EXAM: LEFT SHOULDER - 2 VIEW; LEFT WRIST - COMPLETE 4 VIEW; LEFT ELBOW - 2 VIEW COMPARISON:  Left thumb radiographs October 11, 2022 FINDINGS: There is a comminuted and minimally displaced fracture of the surgical neck of the left humerus. There is no definite intra-articular extension. There is moderate overlying soft tissue swelling. No definite additional acute fracture is visualized. However, there is limited assessment for elbow joint effusion due to limitations in patient positioning. Partially visualized healed fractures of the proximal and distal phalanxes of the first digit. No dislocation.  The joint spaces are well-maintained. Diffuse vascular calcifications. IMPRESSION: 1. Comminuted and minimally displaced left humeral neck fracture. 2. No  definite additional acute fracture is visualized in the left elbow or wrist. Electronically Signed   By: Dirk Arrant HERO.D.  On: 04/25/2023 10:14   DG Elbow 2 Views Left Result Date: 04/25/2023 CLINICAL DATA:  Pain after fall EXAM: LEFT SHOULDER - 2 VIEW; LEFT WRIST - COMPLETE 4 VIEW; LEFT ELBOW - 2 VIEW COMPARISON:  Left thumb radiographs October 11, 2022 FINDINGS: There is a comminuted and minimally displaced fracture of the surgical neck of the left humerus. There is no definite intra-articular extension. There is moderate overlying soft tissue swelling. No definite additional acute fracture is visualized. However, there is limited assessment for elbow joint effusion due to limitations in patient positioning. Partially visualized healed fractures of the proximal and distal phalanxes of the first digit. No dislocation.  The joint spaces are well-maintained. Diffuse vascular calcifications. IMPRESSION: 1. Comminuted and minimally displaced left humeral neck fracture. 2. No definite additional acute fracture is visualized in the left elbow or wrist. Electronically Signed   By: Dirk Arrant M.D.   On: 04/25/2023 10:14   US  Venous Img Upper Uni Left (DVT) Result Date: 04/24/2023 CLINICAL DATA:  [id number] Left arm swelling [id number] EXAM: LEFT UPPER EXTREMITY VENOUS DOPPLER ULTRASOUND TECHNIQUE: Gray-scale sonography with graded compression, as well as color Doppler and duplex ultrasound were performed to evaluate the upper extremity deep venous system from the level of the subclavian vein and including the jugular, axillary, basilic, radial, ulnar and upper cephalic vein. Spectral Doppler was utilized to evaluate flow at rest and with distal augmentation maneuvers. COMPARISON:  None Available. FINDINGS: Contralateral Subclavian Vein: Respiratory phasicity is normal and symmetric with the symptomatic side. No evidence of thrombus. Normal compressibility. Internal Jugular Vein: No evidence of thrombus. Normal  compressibility, respiratory phasicity and response to augmentation. Subclavian Vein: No evidence of thrombus. Normal compressibility, respiratory phasicity and response to augmentation. Axillary Vein: No evidence of thrombus. Normal compressibility, respiratory phasicity and response to augmentation. Cephalic Vein: No evidence of thrombus. Normal compressibility, respiratory phasicity and response to augmentation. Basilic Vein: No evidence of thrombus. Normal compressibility, respiratory phasicity and response to augmentation. Brachial Veins: No evidence of thrombus. Normal compressibility, respiratory phasicity and response to augmentation. Radial Veins: No evidence of thrombus. Normal compressibility, respiratory phasicity and response to augmentation. Ulnar Veins: No evidence of thrombus. Normal compressibility, respiratory phasicity and response to augmentation. Venous Reflux:  None visualized. Other Findings:  None visualized. IMPRESSION: No evidence of DVT within the left upper extremity. Electronically Signed   By: Selinda DELENA Blue M.D.   On: 04/24/2023 15:02   DG Chest Port 1 View Result Date: 04/21/2023 CLINICAL DATA:  Hyperglycemia EXAM: PORTABLE CHEST 1 VIEW COMPARISON:  Chest x-ray 10/11/2022 FINDINGS: Heart is mildly enlarged, unchanged. The lungs are clear. There is no pleural effusion or pneumothorax. No acute fractures are seen. IMPRESSION: No active disease. Electronically Signed   By: Greig Pique M.D.   On: 04/21/2023 21:18   CT Head Wo Contrast Result Date: 04/21/2023 CLINICAL DATA:  Headache, neuro deficit. EXAM: CT HEAD WITHOUT CONTRAST TECHNIQUE: Contiguous axial images were obtained from the base of the skull through the vertex without intravenous contrast. RADIATION DOSE REDUCTION: This exam was performed according to the departmental dose-optimization program which includes automated exposure control, adjustment of the mA and/or kV according to patient size and/or use of iterative  reconstruction technique. COMPARISON:  Head CT 03/13/2023 and MRI 03/05/2023 FINDINGS: Brain: There is no evidence of an acute infarct, intracranial hemorrhage, mass, midline shift, or extra-axial fluid collection. Chronic infarcts are again seen in the right cerebral hemisphere primarily in the MCA distribution with associated ex vacuo dilatation of  the right lateral ventricle and wallerian degeneration in the cerebral peduncle. A small chronic left occipital infarct is also again noted. Vascular: Calcified atherosclerosis at the skull base. No hyperdense vessel. Skull: No acute fracture or suspicious osseous lesion. Sinuses/Orbits: Visualized paranasal sinuses and mastoid air cells are clear. Unremarkable orbits. Other: None. IMPRESSION: 1. No evidence of acute intracranial abnormality. 2. 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 Electronically Signed   By: Dasie Hamburg M.D.   On: 04/21/2023 19:19    Time coordinating discharge: 60 mins  SIGNED:  Camellia Door, DO [facility] 05/03/23, 10:58 AM

## 2023-05-03 NOTE — Progress Notes (Signed)
 PROGRESS NOTE    Lauren [name]  FMW:[id number] DOB: [birth year] DOA: 04/29/2023 PCP: Melvenia Manus BRAVO, MD  Subjective: Lauren [name] seen and examined.  Staph epi only 1 of 2 bottles. Discussed with pharmacy. Will consider this a contaminant and not a true blood stream infection.  Lauren [name] wanting to go home today. I had Lauren [name] call her husband on her cellphone. I talked with husband. I asked him to bring a new Omnipod to hospital so I could verify that Lauren [name] has attached a new omnipod onto her body prior to discharge.  CBG are controlled with SQ insulin . Discussed with Lauren [name] that she will need to reduce basal rate of omnipod since she got a dose of Lantus  today.   Hospital Course: HPI: Lauren [name] encephalopathic, therefore HPI obtained from EMR review.   42yoF with PMH as below significant for type 1 DM (uses insulin  pump), seizures, CVA on coumadin , CKD stage 4 . Kown CAD with NSTEMI in July 2024. , CKD with baselin creat 3s. Recently seen in ER for left proximal humerus fx s/p fall 12/23.  Recent admits 12/19- 12/22 for DKA and UTI and 03/2023 with seizure, DKA, and AKI. Then 04/29/2023 - who presented with confusion and weakness from home to [facility].  CBG> 600.  Afebrile and hypotensive in ER.  Found to be in recurrent DKA with bicarb < 7, glucose 832, beta-hydroxybutyric acid > 8, K 6, sCr 3.95, pending CBC and UA Concern for sepsis w/ recent UTI, cultured and started on empiric vanc, flagyl , and aztreonam  started.  Insulin  gtt started.  CXR without acute process.  .  Remains hypotensive despite 3L LR, starting pressors. Lauren [name] to be transferred to [facility] for further ICU care.     Per EDP, verified prior code status with Lauren [name] and her husband, verified no CPR/ intubation.   Significant Events: Admitted 04/29/2023 for DKA  ESTablisedh DNR - family very vocal about suing hospital if Lauren [name] is intubated or CPR  -12/27/20244 PM at [facility], ICU in [place]: Daughter is indicated that the insulin  pump is working well and sugars were  fairly under control on 04/28/2023.  This morning was unsteady and an hour later was extremely lethargic and taken to the ED.  In the ED apparently patient was hypotensive.  According to daughter patient probably received pressors.  Currently normotensive and not on pressors.  Patient's confirmed DNR but family worried about ongoing encephalopathy and obtundation.  Patient is afebrile.. RN at the ICU indicate peripheral line sites moves easily [patient is on Plavix  and Coumadin . Cardiology consultation for elevated troponin: Not a good cardiac catheterization candidate if troponin increases then consider IV heparin .  Avoid QT prolongation medications.  Hold GDMT with low blood pressure.  Patient's heart grossly volume overloaded. LEFT FEMORAL CVL PLACED IN ER 04/29/23 12/28: On room air, on insulin  infusion.  Appears to be off Levophed  infusion [in fact she was not at the time of arrival to the ICU] creatinine marginally improved.  Insulin  infusion rate is at 2.6 units/h [arrival yesterday was on 8 units].  Afebrile.  Is on antibiotics so far culture negative.  This morning more awake but ongoing confusion present according to nursing.   - Has purewick --urine appears discolored.  Mild fold smell in the perineal area present according to nursing. - Trop 2.6K -> 6.8K yesterday; pending today - BHOB - 0.1, gap 7, bic 21 this morning and all acceptable  INR 2.8 CARDS EVAL EF 35% global hypokienss Start low dose ASA and coreg . Not  a candidate for ACE/ARB/ARNI with significant renal failure  12/29 O n room air. AFebrile. BCID with staph epi. Oheerwise culture neg. CReat improved to 2.7mg . BHOB worse than 24h 0.1 > 0.82. TROP down to 3900. HHuge improivement in mental status. She is hungry and wants to eat    Antibiotic Therapy: Anti-infectives (From admission, onward)    Start     Dose/Rate Route Frequency Ordered Stop   04/30/23 1230  linezolid  (ZYVOX ) IVPB 600 mg  Status:  Discontinued        600  mg 300 mL/hr over 60 Minutes Intravenous Every 12 hours 04/30/23 1131 05/01/23 0943   04/30/23 1030  cefTRIAXone  (ROCEPHIN ) 1 g in sodium chloride  0.9 % 100 mL IVPB        1 g 200 mL/hr over 30 Minutes Intravenous Every 24 hours 04/30/23 0938     04/29/23 1000  vancomycin  (VANCOCIN ) IVPB 1000 mg/200 mL premix        1,000 mg 200 mL/hr over 60 Minutes Intravenous Every 1 hr x 2 04/29/23 0942 04/29/23 1414   04/29/23 0945  aztreonam  (AZACTAM ) 2 g in sodium chloride  0.9 % 100 mL IVPB        2 g 200 mL/hr over 30 Minutes Intravenous  Once 04/29/23 0939 04/29/23 1205   04/29/23 0945  metroNIDAZOLE  (FLAGYL ) IVPB 500 mg        500 mg 100 mL/hr over 60 Minutes Intravenous  Once 04/29/23 0939 04/29/23 1313   04/29/23 0945  vancomycin  (VANCOCIN ) IVPB 1000 mg/200 mL premix  Status:  Discontinued        1,000 mg 200 mL/hr over 60 Minutes Intravenous  Once 04/29/23 9060 04/29/23 9057       Procedures:   Consultants: PCCM cardiology    Assessment and Plan: * DKA (diabetic ketoacidosis) (HCC) On admission, Lauren [name] admitted to [facility] service and treated with DKA protocol with insulin  gtts. Lauren [name]'s DKA resolved and Lauren [name] changed over to SQ insulin . Lauren [name]'s care transferred to [facility] on 05-02-2023.  05-02-2023 CBG stable. I asked Lauren [name] to communicate to her family and have them bring her insulin  pump to hospital so that it could be restarted.  05-03-2023 spoke to Lauren [name]'s husband Lauren [name] via her cellphone. I asked him to bring a new omnipod to hospital so that it could be attached to patient prior to discharge.  SEMI (subendocardial myocardial infarction) [facility]) On admission, Lauren [name] seen by cardiology. She was diagnosed with subendocardial MI. Lauren [name] with known occluded CAD. Cardiology felt that Lauren [name] did not have any targets for intervention given her prior LHC in 07-2022. Lauren [name] treated medically. Repeat echo 04-30-2023 showed stable LVEF of 30 to 35%.  05-02-2023 continue with medical management. Cardiology signed off her case on  05-01-2023. Per cards note 05-01-2023 Not a candidate for ACE/ARB/ARNI with significant renal failure. Continue with coreg  3.125 mg bid, ASA 81 mg qday. No statin due to elevated LFTs.  05-03-2023 LFTs continued to improve. She will need repeat LFTs in PCP office in 1-2 weeks. Then restart lipitor at that time.  Left humeral fracture This occurred prior to admission. Her left shoulder Xr are from 04-25-2023. Lauren [name] admitted on 04-29-2023.  05-02-2023 continue with prn oxycodone . Will need ortho f/u. Keep left arm in sling.  05-03-2023 will send ambulatory referral to ortho in [place] for f/u of left humerus fracture.  Acute metabolic encephalopathy On admission, Lauren [name] noted to be encephalopathic. This was felt to be due to her DKA.  This resolved with treatment of her DKA.  DNR (do not resuscitate) Lauren [name] made DNR on admission.  History of CVA (cerebrovascular accident) Lauren [name] on coumadin  for prior hx of CVA on Eliquis . Had been on lovenox  until dec 2023 when she was changed over to coumadin   05-02-2023 start lovenox  as a bridge to coumadin . INR 1.1 today. Pharmacy dosing coumadin .  05-03-2023 Lauren [name] received vitamin K . On lovenox . Received 10 mg coumadin  last night. INR 1.5 today.  Chronic combined systolic and diastolic heart failure (HCC) 05-02-2023 LVEF stable at 30-35%. Cardiology signed off 05-01-2023. Continue with coreg  3.125 mg bid only. Per cards, Lauren [name] not a candidate for ARB/ACEI/ARNI due to CKD stage 4. If HTN becomes an issue, can add hydralazine  or long acting nitrates.  05-03-2023 BP up today to 174/97. HR 89. Will increase coreg  to 6.25 mg bid. She will need referral to CHF clinic.  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  Will refer to outpatient nephrology.   Class 2 obesity 05-03-2023 BMI 36.25  Essential hypertension 05-02-2023 continue with coreg .  Per cards, Lauren [name] not a candidate for  ARB/ACEI/ARNI due to CKD. If HTN becomes an issue, can add hydralazine  or long acting nitrates.  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 Until insulin  pump is restarted, continue with lantus  30 u qday, TID mealtime 2 units novolog   and SSI.  Hemiparesis affecting left side as late effect of cerebrovascular accident (CVA) (HCC) Chronic.  Elevated LFTs Likely due to shock liver from DKA, hypovolemia. Repeat LFTs. Hold statins for now.  05-03-2023 LFTs improving. AST 278 -->147, ALT 71-->91.  Will need repeat LFTs in PCP office in 1-2 weeks, then restart lipitor.  Acute kidney injury superimposed on stage 4 chronic kidney disease (HCC) Lauren [name] admitted with Scr 3.95. baseline Scr 2.8-3.0. with treatment of her DKA, Scr has trended back to her baseline.  05-03-2023 Scr 1.88. will start bicarb 650 mg bid.       DVT prophylaxis: SCDs Start: 04/29/23 1824 warfarin (COUMADIN ) tablet 7.5 mg  Lovenox    Code Status: Limited: Do not attempt resuscitation (DNR) -DNR-LIMITED -Do Not Intubate/DNI  Family Communication: discussed with husband via phone Disposition Plan: return home Reason for continuing need for hospitalization: medically stable for discharge.  Objective: Vitals:   05/02/23 1704 05/02/23 2027 05/03/23 0417 05/03/23 0742  BP: (!) 151/96 (!) 161/92 (!) 161/98 (!) 174/97  Pulse: 82 80 86 89  Resp: 18 18 18 17   Temp: 99.1 F (37.3 C) 98.2 F (36.8 C) 98.5 F (36.9 C)   TempSrc: Oral Oral Oral   SpO2: 99% 100% 100% 100%  Weight:   98.8 kg   Height:        Intake/Output Summary (Last 24 hours) at 05/03/2023 1018 Last data filed at 05/02/2023 1700 Gross per 24 hour  Intake 400 ml  Output --  Net 400 ml   Filed Weights   04/30/23 0102 05/01/23 0410 05/03/23 0417  Weight: 99.1 kg 99.5 kg 98.8 kg    Examination:  Physical Exam Vitals and nursing note reviewed.   Constitutional:      General: She is not in acute distress.    Appearance: She is obese. She is not toxic-appearing or diaphoretic.  HENT:     Head: Normocephalic.     Nose: Nose normal.  Eyes:     General: No scleral  icterus. Cardiovascular:     Rate and Rhythm: Normal rate and regular rhythm.  Pulmonary:     Effort: Pulmonary effort is normal.     Breath sounds: Normal breath sounds.  Abdominal:     General: Bowel sounds are normal.     Palpations: Abdomen is soft.  Musculoskeletal:     Right lower leg: Edema present.     Left lower leg: Edema present.     Comments: Left arm in sling.  Skin:    General: Skin is warm and dry.     Capillary Refill: Capillary refill takes less than 2 seconds.  Neurological:     Mental Status: She is alert and oriented to person, place, and time.     Comments: Chronic left hemiparesis.     Data Reviewed: I have personally reviewed following labs and imaging studies  CBC: Recent Labs  Lab 04/29/23 2218 04/30/23 0446 05/01/23 0359 05/02/23 0551 05/03/23 0304  WBC 16.2* 21.2* 14.1* 7.7 7.8  NEUTROABS 14.2*  --   --   --  5.0  HGB 8.5* 8.3* 8.2* 8.3* 8.9*  HCT 25.5* 24.9* 24.8* 25.9* 27.8*  MCV 89.8 89.2 90.2 92.5 92.7  PLT 351 383 385 364 422*   Basic Metabolic Panel: Recent Labs  Lab 04/29/23 1958 04/29/23 2218 04/30/23 0446 04/30/23 0930 05/01/23 0359 05/02/23 0551 05/03/23 0304  NA 131*   < > 137 135 137 140 139  K 4.1   < > 3.4* 3.4* 3.3* 3.3* 3.8  CL 101   < > 109 108 111 112* 113*  CO2 14*   < > 21* 17* 18* 19* 16*  GLUCOSE 611*   < > 194* 169* 189* 132* 140*  BUN 45*   < > 43* 44* 34* 24* 20  CREATININE 3.95*   < > 3.39* 3.31* 2.71* 2.24* 1.88*  CALCIUM  7.5*   < > 7.7* 7.6* 7.6* 7.9* 8.2*  MG 1.9  --   --  1.8 2.3 2.0  --   PHOS  --   --   --  3.3 2.8 2.6  --    < > = values in this interval not displayed.   GFR: Estimated Creatinine Clearance: 46.3 mL/min (A) (by C-G formula based on SCr of 1.88 mg/dL  (H)). Liver Function Tests: Recent Labs  Lab 04/29/23 1128 04/30/23 0446 05/01/23 0359 05/02/23 0551 05/03/23 0304  AST 25 36 28 278* 147*  ALT 20 24 21  71* 91*  ALKPHOS 74 80 76 356* 504*  BILITOT 1.6* 0.5 0.6 0.8 0.4  PROT 4.8* 4.6* 4.4* 4.7* 5.7*  ALBUMIN  2.0* 2.0* 1.8* 2.0* 2.4*   Coagulation Profile: Recent Labs  Lab 04/29/23 1958 04/30/23 0446 05/01/23 0520 05/02/23 0551 05/03/23 0304  INR 9.6* 2.8* 1.3* 1.1 1.5*   BNP (last 3 results) Recent Labs    07/27/22 0548 08/02/22 2023 11/07/22 1129  BNP 2,632.3* 869.3* 1,973.2*   CBG: Recent Labs  Lab 05/02/23 1225 05/02/23 1701 05/02/23 2029 05/03/23 0610 05/03/23 0742  GLUCAP 160* 110* 148* 125* 136*   Sepsis Labs: Recent Labs  Lab 04/29/23 1703 04/29/23 2218 04/30/23 0930 05/01/23 0359  PROCALCITON  --   --  12.53 10.76  LATICACIDVEN 3.2* 2.2* 1.5 1.1    Recent Results (from the past 240 hours)  Resp panel by RT-PCR (RSV, Flu A&B, Covid) Anterior Nasal Swab     Status: None   Collection Time: 04/29/23  9:59 AM   Specimen: Anterior Nasal Swab  Result Value  Ref Range Status   SARS Coronavirus 2 by RT PCR NEGATIVE NEGATIVE Final    Comment: (NOTE) SARS-CoV-2 target nucleic acids are NOT DETECTED.  The SARS-CoV-2 RNA is generally detectable in upper respiratory specimens during the acute phase of infection. The lowest concentration of SARS-CoV-2 viral copies this assay can detect is 138 copies/mL. A negative result does not preclude SARS-Cov-2 infection and should not be used as the sole basis for treatment or other patient management decisions. A negative result may occur with  improper specimen collection/handling, submission of specimen other than nasopharyngeal swab, presence of viral mutation(s) within the areas targeted by this assay, and inadequate number of viral copies(<138 copies/mL). A negative result must be combined with clinical observations, patient history, and  epidemiological information. The expected result is Negative.  Fact Sheet for Patients:  bloggercourse.com  Fact Sheet for Healthcare Providers:  seriousbroker.it  This test is no t yet approved or cleared by the [place]  FDA and  has been authorized for detection and/or diagnosis of SARS-CoV-2 by FDA under an Emergency Use Authorization (EUA). This EUA will remain  in effect (meaning this test can be used) for the duration of the COVID-19 declaration under Section 564(b)(1) of the Act, 21 U.S.C.section 360bbb-3(b)(1), unless the authorization is terminated  or revoked sooner.       Influenza A by PCR NEGATIVE NEGATIVE Final   Influenza B by PCR NEGATIVE NEGATIVE Final    Comment: (NOTE) The Xpert Xpress SARS-CoV-2/FLU/RSV plus assay is intended as an aid in the diagnosis of influenza from Nasopharyngeal swab specimens and should not be used as a sole basis for treatment. Nasal washings and aspirates are unacceptable for Xpert Xpress SARS-CoV-2/FLU/RSV testing.  Fact Sheet for Patients: bloggercourse.com  Fact Sheet for Healthcare Providers: seriousbroker.it  This test is not yet approved or cleared by the [place]  FDA and has been authorized for detection and/or diagnosis of SARS-CoV-2 by FDA under an Emergency Use Authorization (EUA). This EUA will remain in effect (meaning this test can be used) for the duration of the COVID-19 declaration under Section 564(b)(1) of the Act, 21 U.S.C. section 360bbb-3(b)(1), unless the authorization is terminated or revoked.     Resp Syncytial Virus by PCR NEGATIVE NEGATIVE Final    Comment: (NOTE) Fact Sheet for Patients: bloggercourse.com  Fact Sheet for Healthcare Providers: seriousbroker.it  This test is not yet approved or cleared by the [place]  FDA and has been  authorized for detection and/or diagnosis of SARS-CoV-2 by FDA under an Emergency Use Authorization (EUA). This EUA will remain in effect (meaning this test can be used) for the duration of the COVID-19 declaration under Section 564(b)(1) of the Act, 21 U.S.C. section 360bbb-3(b)(1), unless the authorization is terminated or revoked.  Performed at [facility], [address] [place], [place] [address]   Blood Culture (routine x 2)     Status: Abnormal   Collection Time: 04/29/23 11:23 AM   Specimen: BLOOD  Result Value Ref Range Status   Specimen Description   Final    BLOOD LEFT FEMORAL ARTERY Performed at [facility], [address], [place], [place] [address]    Special Requests   Final    BOTTLES DRAWN AEROBIC AND ANAEROBIC Blood Culture results may not be optimal due to an inadequate volume of blood received in culture bottles Performed at [facility], [address], [place], [place] [address]    Culture  Setup Time   Final    GRAM POSITIVE COCCI AEROBIC  BOTTLE ONLY Gram Stain Report Called to,Read Back By and Verified With: A PUGH AT [facility] AT 1122 ON [id number] BY S [place] CRITICAL RESULT CALLED TO, READ BACK BY AND VERIFIED WITH: PHARMD J LEDFORD 05/01/2023 @ 0016 BY AB    Culture (A)  Final    STAPHYLOCOCCUS EPIDERMIDIS THE SIGNIFICANCE OF ISOLATING THIS ORGANISM FROM A SINGLE VENIPUNCTURE CANNOT BE PREDICTED WITHOUT FURTHER CLINICAL AND CULTURE CORRELATION. SUSCEPTIBILITIES AVAILABLE ONLY ON REQUEST. Performed at [facility], [address], [place], [place] [address]    Report Status 05/03/2023 FINAL  Final  Blood Culture ID Panel (Reflexed)     Status: Abnormal   Collection Time: 04/29/23 11:23 AM  Result Value Ref Range Status   Enterococcus faecalis NOT DETECTED NOT DETECTED Final   Enterococcus Faecium NOT DETECTED NOT DETECTED Final   Listeria monocytogenes NOT DETECTED NOT DETECTED Final   Staphylococcus species DETECTED (A) NOT DETECTED Final    Comment:  CRITICAL RESULT CALLED TO, READ BACK BY AND VERIFIED WITH: PHARMD J LEDFORD 05/01/2023 @ 0016 BY AB    Staphylococcus aureus (BCID) NOT DETECTED NOT DETECTED Final   Staphylococcus epidermidis DETECTED (A) NOT DETECTED Final    Comment: Methicillin (oxacillin) resistant coagulase negative staphylococcus. Possible blood culture contaminant (unless isolated from more than one blood culture draw or clinical case suggests pathogenicity). No antibiotic treatment is indicated for blood  culture contaminants. CRITICAL RESULT CALLED TO, READ BACK BY AND VERIFIED WITH: PHARMD J LEDFORD 05/01/2023 @ 0016 BY AB    Staphylococcus lugdunensis NOT DETECTED NOT DETECTED Final   Streptococcus species NOT DETECTED NOT DETECTED Final   Streptococcus agalactiae NOT DETECTED NOT DETECTED Final   Streptococcus pneumoniae NOT DETECTED NOT DETECTED Final   Streptococcus pyogenes NOT DETECTED NOT DETECTED Final   A.calcoaceticus-baumannii NOT DETECTED NOT DETECTED Final   Bacteroides fragilis NOT DETECTED NOT DETECTED Final   Enterobacterales NOT DETECTED NOT DETECTED Final   Enterobacter cloacae complex NOT DETECTED NOT DETECTED Final   Escherichia coli NOT DETECTED NOT DETECTED Final   Klebsiella aerogenes NOT DETECTED NOT DETECTED Final   Klebsiella oxytoca NOT DETECTED NOT DETECTED Final   Klebsiella pneumoniae NOT DETECTED NOT DETECTED Final   Proteus species NOT DETECTED NOT DETECTED Final   Salmonella species NOT DETECTED NOT DETECTED Final   Serratia marcescens NOT DETECTED NOT DETECTED Final   Haemophilus influenzae NOT DETECTED NOT DETECTED Final   Neisseria meningitidis NOT DETECTED NOT DETECTED Final   Pseudomonas aeruginosa NOT DETECTED NOT DETECTED Final   Stenotrophomonas maltophilia NOT DETECTED NOT DETECTED Final   Candida albicans NOT DETECTED NOT DETECTED Final   Candida auris NOT DETECTED NOT DETECTED Final   Candida glabrata NOT DETECTED NOT DETECTED Final   Candida krusei NOT DETECTED  NOT DETECTED Final   Candida parapsilosis NOT DETECTED NOT DETECTED Final   Candida tropicalis NOT DETECTED NOT DETECTED Final   Cryptococcus neoformans/gattii NOT DETECTED NOT DETECTED Final   Methicillin resistance mecA/C DETECTED (A) NOT DETECTED Final    Comment: CRITICAL RESULT CALLED TO, READ BACK BY AND VERIFIED WITH: PHARMD J LEDFORD 05/01/2023 @ 0016 BY AB Performed at [facility], [address], [place], [place] [address]   Culture, blood (Routine X 2) w Reflex to ID Panel     Status: None (Preliminary result)   Collection Time: 04/29/23  5:03 PM   Specimen: BLOOD  Result Value Ref Range Status   Specimen Description BLOOD SITE NOT SPECIFIED  Final   Special Requests  Final    BOTTLES DRAWN AEROBIC ONLY Blood Culture results may not be optimal due to an inadequate volume of blood received in culture bottles   Culture   Final    NO GROWTH 3 DAYS Performed at [facility], [address], [place], [place] [address]    Report Status PENDING  Incomplete  Urine Culture (for pregnant, neutropenic or urologic patients or patients with an indwelling urinary catheter)     Status: Abnormal   Collection Time: 04/30/23  9:11 AM   Specimen: Urine, Bag (ped)  Result Value Ref Range Status   Specimen Description URINE, BAG PED  Final   Special Requests NONE  Final   Culture (A)  Final    <10,000 COLONIES/mL INSIGNIFICANT GROWTH Performed at [facility], [address] [place], [place] [address]    Report Status 05/01/2023 FINAL  Final  MRSA Next Gen by PCR, Nasal     Status: None   Collection Time: 04/30/23  9:36 AM   Specimen: Nasal Mucosa; Nasal Swab  Result Value Ref Range Status   MRSA by PCR Next Gen NOT DETECTED NOT DETECTED Final    Comment: (NOTE) The GeneXpert MRSA Assay (FDA approved for NASAL specimens only), is one component of a comprehensive MRSA colonization surveillance program. It is not intended to diagnose MRSA infection nor to guide or  monitor treatment for MRSA infections. Test performance is not FDA approved in patients less than 63 years old. Performed at [facility], [address] [place], [place] [address]      Radiology Studies: No results found.  Scheduled Meds:  aspirin  EC  81 mg Oral Daily   carvedilol   6.25 mg Oral BID WC   Chlorhexidine  Gluconate Cloth  6 each Topical Daily   enoxaparin  (LOVENOX ) injection  100 mg Subcutaneous BID   insulin  aspart  0-15 Units Subcutaneous TID WC   insulin  aspart  0-5 Units Subcutaneous QHS   insulin  aspart  2 Units Subcutaneous TID WC   insulin  glargine-yfgn  30 Units Subcutaneous Daily   sodium bicarbonate   650 mg Oral BID   warfarin  7.5 mg Oral ONCE-1600   Warfarin - Pharmacist Dosing Inpatient   Does not apply q1600   Continuous Infusions:  cefTRIAXone  (ROCEPHIN )  IV 1 g (05/02/23 0836)     LOS: 4 days   Time spent: 40 minutes  Camellia Door, DO  [facility]  05/03/2023, 10:18 AM

## 2023-05-04 DIAGNOSIS — E1022 Type 1 diabetes mellitus with diabetic chronic kidney disease: Secondary | ICD-10-CM | POA: Insufficient documentation

## 2023-05-04 LAB — CULTURE, BLOOD (ROUTINE X 2): Culture: NO GROWTH

## 2023-05-05 ENCOUNTER — Emergency Department (HOSPITAL_COMMUNITY)
Admission: EM | Admit: 2023-05-05 | Discharge: 2023-05-05 | Disposition: A | Discharge status: Home / Self Care | Payer: Self-pay | Attending: Emergency Medicine | Admitting: Emergency Medicine

## 2023-05-05 ENCOUNTER — Telehealth: Payer: Self-pay

## 2023-05-05 ENCOUNTER — Other Ambulatory Visit: Payer: Self-pay

## 2023-05-05 ENCOUNTER — Encounter (HOSPITAL_COMMUNITY): Payer: Self-pay | Admitting: *Deleted

## 2023-05-05 DIAGNOSIS — S40022A Contusion of left upper arm, initial encounter: Secondary | ICD-10-CM | POA: Diagnosis not present

## 2023-05-05 DIAGNOSIS — Z8673 Personal history of transient ischemic attack (TIA), and cerebral infarction without residual deficits: Secondary | ICD-10-CM | POA: Diagnosis not present

## 2023-05-05 DIAGNOSIS — I509 Heart failure, unspecified: Secondary | ICD-10-CM | POA: Insufficient documentation

## 2023-05-05 DIAGNOSIS — S40021A Contusion of right upper arm, initial encounter: Secondary | ICD-10-CM | POA: Diagnosis not present

## 2023-05-05 DIAGNOSIS — Z7902 Long term (current) use of antithrombotics/antiplatelets: Secondary | ICD-10-CM | POA: Diagnosis not present

## 2023-05-05 DIAGNOSIS — Z794 Long term (current) use of insulin: Secondary | ICD-10-CM | POA: Insufficient documentation

## 2023-05-05 DIAGNOSIS — S301XXA Contusion of abdominal wall, initial encounter: Secondary | ICD-10-CM | POA: Diagnosis not present

## 2023-05-05 DIAGNOSIS — D689 Coagulation defect, unspecified: Secondary | ICD-10-CM | POA: Diagnosis not present

## 2023-05-05 DIAGNOSIS — Z7901 Long term (current) use of anticoagulants: Secondary | ICD-10-CM | POA: Insufficient documentation

## 2023-05-05 DIAGNOSIS — E109 Type 1 diabetes mellitus without complications: Secondary | ICD-10-CM | POA: Diagnosis not present

## 2023-05-05 DIAGNOSIS — X58XXXA Exposure to other specified factors, initial encounter: Secondary | ICD-10-CM | POA: Diagnosis not present

## 2023-05-05 LAB — PROTIME-INR
INR: 3.1 — ABNORMAL HIGH (ref 0.8–1.2)
Prothrombin Time: 32.1 s — ABNORMAL HIGH (ref 11.4–15.2)

## 2023-05-05 LAB — CBC WITH DIFFERENTIAL/PLATELET
Abs Immature Granulocytes: 0.14 10*3/uL — ABNORMAL HIGH (ref 0.00–0.07)
Basophils Absolute: 0.1 10*3/uL (ref 0.0–0.1)
Basophils Relative: 1 %
Eosinophils Absolute: 0.2 10*3/uL (ref 0.0–0.5)
Eosinophils Relative: 2 %
HCT: 27.5 % — ABNORMAL LOW (ref 36.0–46.0)
Hemoglobin: 8.8 g/dL — ABNORMAL LOW (ref 12.0–15.0)
Immature Granulocytes: 2 %
Lymphocytes Relative: 24 %
Lymphs Abs: 2.1 10*3/uL (ref 0.7–4.0)
MCH: 30.7 pg (ref 26.0–34.0)
MCHC: 32 g/dL (ref 30.0–36.0)
MCV: 95.8 fL (ref 80.0–100.0)
Monocytes Absolute: 0.5 10*3/uL (ref 0.1–1.0)
Monocytes Relative: 6 %
Neutro Abs: 5.6 10*3/uL (ref 1.7–7.7)
Neutrophils Relative %: 65 %
Platelets: 429 10*3/uL — ABNORMAL HIGH (ref 150–400)
RBC: 2.87 MIL/uL — ABNORMAL LOW (ref 3.87–5.11)
RDW: 15.9 % — ABNORMAL HIGH (ref 11.5–15.5)
WBC: 8.6 10*3/uL (ref 4.0–10.5)
nRBC: 0 % (ref 0.0–0.2)

## 2023-05-05 LAB — BASIC METABOLIC PANEL
Anion gap: 8 (ref 5–15)
BUN: 16 mg/dL (ref 6–20)
CO2: 19 mmol/L — ABNORMAL LOW (ref 22–32)
Calcium: 8.2 mg/dL — ABNORMAL LOW (ref 8.9–10.3)
Chloride: 110 mmol/L (ref 98–111)
Creatinine, Ser: 2.08 mg/dL — ABNORMAL HIGH (ref 0.44–1.00)
GFR, Estimated: 30 mL/min — ABNORMAL LOW (ref >60)
Glucose, Bld: 99 mg/dL (ref 70–99)
Potassium: 3.5 mmol/L (ref 3.5–5.1)
Sodium: 137 mmol/L (ref 135–145)

## 2023-05-05 LAB — LACTIC ACID, PLASMA
Lactic Acid, Venous: 1.3 mmol/L (ref 0.5–1.9)
Lactic Acid, Venous: 1 mmol/L (ref 0.5–1.9)

## 2023-05-05 LAB — APTT: aPTT: 77 s — ABNORMAL HIGH (ref 24–36)

## 2023-05-05 LAB — HCG, QUANTITATIVE, PREGNANCY: hCG, Beta Chain, Quant, S: 2 m[IU]/mL (ref <5)

## 2023-05-05 MED ORDER — SILVER NITRATE-POT NITRATE 75-25 % EX MISC
CUTANEOUS | Status: AC
  Filled 2023-05-05: qty 20

## 2023-05-05 MED ORDER — SODIUM CHLORIDE 0.9 % IV BOLUS
1000.0000 mL | Freq: Once | INTRAVENOUS | Status: AC
  Administered 2023-05-05: 1000 mL via INTRAVENOUS

## 2023-05-05 MED ORDER — SODIUM CHLORIDE 0.9 % IV BOLUS
1000.0000 mL | Freq: Once | INTRAVENOUS | Status: AC
Start: 2023-05-05 — End: 2023-05-05
  Administered 2023-05-05: 1000 mL via INTRAVENOUS

## 2023-05-05 NOTE — ED Provider Notes (Signed)
 [place] [facility]ERGENCY DEPARTMENT AT [facility] Provider Note   CSN: [id number] Arrival date & time: 05/05/23  1319     History  Chief Complaint  Patient presents with   Coagulation Disorder    Dajah Kasparian is a 43 y.o. female with a history of right MCA stroke, CHF, MI, and type 1 diabetes mellitus who presents the ED today for bleeding.  Patient reports that she gave herself her Lovenox  injection around 4 AM this morning and has been bleeding from the site since. The blood is slowly coming out of the injection site at the lower right abdomen on evaluation. No shortness of breath, weakness, or abdominal pain.     Home Medications Prior to Admission medications   Medication Sig Start Date End Date Taking? Authorizing Provider  acetaminophen  (TYLENOL ) 325 MG tablet Take 1-2 tablets (325-650 mg total) by mouth every 4 (four) hours as needed for mild pain. Patient taking differently: Take 650 mg by mouth as needed for mild pain (pain score 1-3). 02/18/22   Love, Sharlet RAMAN, PA-C  atorvastatin  (LIPITOR) 40 MG tablet TAKE 1 TABLET BY MOUTH ONCE DAILY. 01/25/23   Melvenia Manus BRAVO, MD  clonazePAM  (KLONOPIN ) 1 MG disintegrating tablet Take 1 tablet (1 mg total) by mouth 2 (two) times daily as needed for seizure (For seizure lasting more than 2 minutes.). 03/16/23   Pearlean Manus, MD  clopidogrel  (PLAVIX ) 75 MG tablet Take 1 tablet (75 mg total) by mouth daily. 04/29/22   Sethi, Pramod S, MD  Continuous Glucose Sensor (DEXCOM G6 SENSOR) MISC CHANGE SENSOR EVERY 10 DAYS. 03/14/23   Therisa Benton PARAS, NP  Continuous Glucose Transmitter (DEXCOM G6 TRANSMITTER) MISC Change transmitter every 90 days as directed. 01/20/23   Nida, Gebreselassie W, MD  enoxaparin  (LOVENOX ) 100 MG/ML injection Inject 1 mL (100 mg total) into the skin 2 (two) times daily for 3 days. 05/03/23 05/06/23  Laurence Locus, DO  furosemide  (LASIX ) 40 MG tablet TAKE 1 TABLET BY MOUTH ONCE DAILY AS NEEDED FOR  SWELLING  05/03/23   Laurence Locus, DO  hydrALAZINE  (APRESOLINE ) 25 MG tablet Take 1 tablet (25 mg total) by mouth in the morning and at bedtime. 04/24/23   Ricky Fines, MD  insulin  aspart (NOVOLOG ) 100 UNIT/ML injection Use with Omnipod for daily dose around 100 units daily Patient taking differently: Inject 0-200 Units into the skin See admin instructions. Load Omnipod with 200 units every 3-4 days. 10/18/22   Therisa Benton PARAS, NP  Insulin  Disposable Pump (OMNIPOD 5 DEXG7G6 PODS GEN 5) MISC Change pod every 48-72 hours. 11/11/22   Therisa Benton PARAS, NP  isosorbide  mononitrate (IMDUR ) 60 MG 24 hr tablet TAKE 2 TABLETS BY MOUTH ONCE DAILY. 02/21/23   Melvenia Manus BRAVO, MD  levothyroxine  (SYNTHROID ) 125 MCG tablet Take 1 tablet (125 mcg total) by mouth daily at 6 (six) AM. 06/17/22 06/12/23  Therisa Benton PARAS, NP  metoprolol  succinate (TOPROL -XL) 50 MG 24 hr tablet Take 1 tablet (50 mg total) by mouth daily. Take with or immediately following a meal. 05/03/23 08/01/23  Laurence Locus, DO  Multiple Vitamin (MULTIVITAMIN WITH MINERALS) TABS tablet Take 1 tablet by mouth daily. 02/18/22   Love, Sharlet RAMAN, PA-C  nitroGLYCERIN  (NITROSTAT ) 0.4 MG SL tablet Place 1 tablet (0.4 mg total) under the tongue every 5 (five) minutes x 3 doses as needed for chest pain. 02/18/22   Love, Sharlet RAMAN, PA-C  oxyCODONE  (OXY IR/ROXICODONE ) 5 MG immediate release tablet Take 1 tablet (5 mg total)  by mouth every 6 (six) hours as needed for moderate pain (pain score 4-6). 05/03/23   Laurence Locus, DO  pantoprazole  (PROTONIX ) 40 MG tablet Take 1 tablet (40 mg total) by mouth daily. Courtesy fill/ pt to get established with a primary MD for further refills Patient taking differently: Take 40 mg by mouth daily as needed (reflux). 06/01/22 04/29/23  Melvenia Manus BRAVO, MD  ranolazine  (RANEXA ) 1000 MG SR tablet TAKE 1  BY MOUTH TWICE DAILY 04/11/23   Dixon, Phillip E, MD  senna-docusate (SENOKOT-S) 8.6-50 MG tablet Take 1-2 tablets by mouth daily as needed  for mild constipation. 08/11/22   Golda Lynwood PARAS, MD  sodium bicarbonate  650 MG tablet Take 1 tablet (650 mg total) by mouth 2 (two) times daily. 05/03/23 06/02/23  Laurence Locus, DO  tiZANidine  (ZANAFLEX ) 4 MG tablet Take 1 tablet (4 mg total) by mouth at bedtime. 01/20/23   Melvenia Manus BRAVO, MD  topiramate  (TOPAMAX ) 50 MG tablet Take 1 tablet (50 mg total) by mouth 2 (two) times daily. 03/16/23   Pearlean Manus, MD  traZODone  (DESYREL ) 100 MG tablet Take 100 mg by mouth at bedtime. 04/05/23   [provider]  warfarin (COUMADIN ) 5 MG tablet TAKE ONE TABLET BY MOUTH DAILY EXCEPT 1/2 TABLET ON WEDNESDAYS AND SATURDAYS OR AS DIRECTED Patient taking differently: Take 2.5-5 mg by mouth daily. Take 5mg  (1 tablet) daily, except on Tuesday, Thursday, and Saturday's take 2.5mg  (1/2 tablet). 02/14/23   Alvan Dorn FALCON, MD      Allergies    Visipaque  [iodixanol ], Wellbutrin [bupropion], Gabapentin , Cefepime , Ciprofloxacin  hcl, and Tape    Review of Systems   Review of Systems  Hematological:  Bruises/bleeds easily.  All other systems reviewed and are negative.   Physical Exam Updated Vital Signs BP 100/72   Pulse 89   Temp 97.7 F (36.5 C) (Oral)   Ht 5' 5 (1.651 m)   Wt 99.8 kg   LMP 05/05/2023 (Exact Date)   SpO2 100%   BMI 36.61 kg/m  Physical Exam Vitals and nursing note reviewed.  Constitutional:      Appearance: Normal appearance.  HENT:     Head: Normocephalic and atraumatic.     Mouth/Throat:     Mouth: Mucous membranes are moist.  Eyes:     Conjunctiva/sclera: Conjunctivae normal.     Pupils: Pupils are equal, round, and reactive to light.  Cardiovascular:     Rate and Rhythm: Normal rate and regular rhythm.     Pulses: Normal pulses.     Heart sounds: Normal heart sounds.  Pulmonary:     Effort: Pulmonary effort is normal.     Breath sounds: Normal breath sounds.  Abdominal:     Palpations: Abdomen is soft.     Tenderness: There is no abdominal tenderness.      Comments: Normal tenderness to palpation. Blood steadily coming from puncture site from this morning's Lovenox  injection.  Musculoskeletal:        General: Swelling present.     Comments: Swelling present of the left arm.  She broke her left humerus on 12/23, and has taken her cast off. ROM limited and sensation intact.   Skin:    General: Skin is warm and dry.     Findings: Bruising present. No rash.     Comments: Bruising present on arms and abdomen, which patient states is from her recent hospitalization  Neurological:     General: No focal deficit present.     Mental  Status: She is alert.     Sensory: No sensory deficit.     Motor: No weakness.  Psychiatric:        Mood and Affect: Mood normal.        Behavior: Behavior normal.    ED Results / Procedures / Treatments   Labs (all labs ordered are listed, but only abnormal results are displayed) Labs Reviewed  CULTURE, BLOOD (ROUTINE X 2)  CULTURE, BLOOD (ROUTINE X 2)  PROTIME-INR  APTT  BASIC METABOLIC PANEL  CBC WITH DIFFERENTIAL/PLATELET  LACTIC ACID, PLASMA  LACTIC ACID, PLASMA    EKG None  Radiology No results found.  Procedures Procedures    Medications Ordered in ED Medications  sodium chloride  0.9 % bolus 1,000 mL (has no administration in time range)    ED Course/ Medical Decision Making/ A&P                                 Medical Decision Making Amount and/or Complexity of Data Reviewed Labs: ordered.   This patient presents to the ED for concern of bleeding, this involves an extensive number of treatment options, and is a complaint that carries with it a high risk of complications and morbidity.   Differential diagnosis includes: Coagulopathy, sepsis, etc.   Comorbidities  See HPI above   Additional History  Additional history obtained from previous hospitalization records.  Lab Tests  I ordered and personally interpreted labs.  The pertinent results include:   PT 32.1, INR  3.1 APTT 77 Lactic acid 1.3, repeat of 1 BMP and CBC are within normal limits for patient.  Comparable to last labs on 12/31. Negative pregnancy test.   Problem List / ED Course / Critical Interventions / Medication Management  Bleeding from injection site since 4 AM. She has her eyes closed on evaluation but is arousable. BP was in the 90s/60s at the this time, she states that she does not know what her blood pressure usually runs, 1L NS given with improvement. BP went up to the 110s/90s.  Discussed patient care with my attending in the morning, Dr. Towana, who advised to get lactic acid levels and blood cultures due to patient's comorbities and recent hospitalization. Quick clot placed on wound site with improvement. Sling and swath provided for the left arm as patient previously broke it on 12/23 and has not been wearing it for several days. On reevaluation, patient was still sleeping but arousable.  I discussed patient case with my afternoon attending, Dr. Zackowski, and asked him to see the patient and see if there is something else to do/order.   Social Determinants of Health  Physical activity   Test / Admission - Considered  Patient care signed out to [facility], PA-C at shift change.       Final Clinical Impression(s) / ED Diagnoses Final diagnoses:  None    Rx / DC Orders ED Discharge Orders     None         Waddell Sluder, PA-C 05/05/23 1934    Towana Ozell BROCKS, MD 05/05/23 2108

## 2023-05-05 NOTE — Transitions of Care (Post Inpatient/ED Visit) (Signed)
   05/05/2023  Name: Lauren [name] MRN: [id number] DOB: 12/14/1980  Today's TOC FU Call Status: Today's TOC FU Call Status:: Unsuccessful Call (1st Attempt) Unsuccessful Call (1st Attempt) Date: 05/05/23  Attempted to reach the patient regarding the most recent Inpatient/ED visit.  Follow Up Plan: Additional outreach attempts will be made to reach the patient to complete the Transitions of Care (Post Inpatient/ED visit) call.   Alan Ee, RN, BSN, CEN [employer]- Transition of Care Team.  [facility] [phone]

## 2023-05-05 NOTE — ED Provider Notes (Addendum)
   Patient signed out to me by Darryle Birmingham, PA-C pending completion of workup.  Patient here for bleeding from abdominal site where she was giving herself a Lovenox  injection.  Describes having continued bleeding since earlier this morning.  Signed out to me pending lactic acid and urinalysis and observation for hemostasis.  Patient on Coumadin  for prior history of CVA on Eliquis .  Had been on Lovenox  since December 2023 when she was changed over to Coumadin .  05/02/2023 was started on Lovenox  as bridge to her Coumadin  she was started back on her Coumadin  at time of hospital discharge.   Patient had localized area of bleeding on arrival, quick clot pad was applied and bleeding has stopped.  She has been observed in the department without further bleeding.  Workup without evidence of sepsis.  Hemoglobin at 8.8 but this appears to be her baseline.  Labs reassuring tonight  Patient also seen by Dr. Zackowski and care plan discussed.  Patient has been unable to provide urinalysis, she is not currently having any dysuria symptoms.  Feel the patient is appropriate for discharge home she will follow-up with PCP and coag clinic as previously recommended.   I was notified by nursing staff at time of discharge, patient got up to go to the restroom and began bleeding from previous injection site of her right abdomen.  My exam, she had mild oozing and a saturated quick clot pad.  Patient was agreeable to cautery using silver  nitrate stick.  Procedure performed by me.  Good hemostasis.  Quick clot pad was also applied.  Patient has been observed for approximately 1 hour without further bleeding.  States she is ready for discharge home.  Repeat blood sugar on patient's Dexcom 81.  She is tolerated oral fluids and ate crackers and peanut butter here.  Patient advised to leave quick clot pad in place for at least 24 to 48 hours.    Herlinda Milling, PA-C 05/05/23 2121    Herlinda Milling, PA-C 05/05/23  2319    Zackowski, Scott, MD 05/06/23 2340

## 2023-05-05 NOTE — ED Triage Notes (Signed)
 Pt in via [facility] from home c/o bleeding from abdomen site where she was giving herself a Lovenox injection, bleeding has been ongoing since 4am, pt reports oozing from the area at this time, pt A&O x4

## 2023-05-05 NOTE — Discharge Instructions (Addendum)
 Please call your primary care provider to arrange follow-up appointment.  As discussed, also follow-up with the coag clinic to have your Coumadin level rechecked.

## 2023-05-06 ENCOUNTER — Telehealth: Payer: Self-pay

## 2023-05-06 NOTE — Transitions of Care (Post Inpatient/ED Visit) (Signed)
   05/06/2023  Name: Lauren [name] MRN: [id number] DOB: 1980/07/13  Today's TOC FU Call Status: Today's TOC FU Call Status:: Unsuccessful Call (2nd Attempt) Unsuccessful Call (2nd Attempt) Date: 05/06/23  Attempted to reach the patient regarding the most recent Inpatient/ED visit.  Follow Up Plan: Additional outreach attempts will be made to reach the patient to complete the Transitions of Care (Post Inpatient/ED visit) call.   Alan Ee, RN, BSN, CEN [employer]- Transition of Care Team.  [facility] [phone]

## 2023-05-07 DIAGNOSIS — S42302D Unspecified fracture of shaft of humerus, left arm, subsequent encounter for fracture with routine healing: Secondary | ICD-10-CM | POA: Diagnosis not present

## 2023-05-07 DIAGNOSIS — S90421D Blister (nonthermal), right great toe, subsequent encounter: Secondary | ICD-10-CM | POA: Diagnosis not present

## 2023-05-07 DIAGNOSIS — I2510 Atherosclerotic heart disease of native coronary artery without angina pectoris: Secondary | ICD-10-CM | POA: Diagnosis not present

## 2023-05-07 DIAGNOSIS — I69354 Hemiplegia and hemiparesis following cerebral infarction affecting left non-dominant side: Secondary | ICD-10-CM | POA: Diagnosis not present

## 2023-05-07 DIAGNOSIS — I214 Non-ST elevation (NSTEMI) myocardial infarction: Secondary | ICD-10-CM | POA: Diagnosis not present

## 2023-05-07 LAB — BLOOD CULTURE ID PANEL (REFLEXED) - BCID2

## 2023-05-07 NOTE — ED Notes (Addendum)
 05/06/2022 0517  Attempt to call patient regarding positive blood cultures per md mesner. No answer from patient. Will pass along for day time charge nurse to call

## 2023-05-07 NOTE — ED Provider Notes (Signed)
 I was informed by nursing that patient had 1 positive blood culture for gram-positive cocci has not been speciated.  On review of her emergency department visit it appears that she was here because she was bleeding from a puncture site for Lovenox .  The bleeding is stopped.  Review of her documentation it does not appear that she was febrile or have any other evidence of sepsis or infection and that cultures were just drawn because she had recently been in the hospital.  I instructed nursing to call patient and if she is having any symptoms at all of any kind of illness such as fever, malaise, nausea or other symptoms that are worrisome to come back to have cultures redrawn otherwise would wait for speciation as she has had no other growth at this time.   Dreyden Rohrman, Selinda, MD 05/07/23 [phone]

## 2023-05-07 NOTE — ED Notes (Addendum)
 Note entered on 05/07/23 at 1006 - Lab called critical value of BCID of staph species only in 1 of 3 blood culture bottles. Dr. Randol notified at 0957, responded at 0959 to call pt to check on her and if she had any symptoms to have her return to the hospital for further treatment. No answer upon calling pt.

## 2023-05-09 ENCOUNTER — Telehealth: Payer: Self-pay

## 2023-05-09 DIAGNOSIS — I214 Non-ST elevation (NSTEMI) myocardial infarction: Secondary | ICD-10-CM | POA: Diagnosis not present

## 2023-05-09 DIAGNOSIS — S42302D Unspecified fracture of shaft of humerus, left arm, subsequent encounter for fracture with routine healing: Secondary | ICD-10-CM | POA: Diagnosis not present

## 2023-05-09 DIAGNOSIS — I2510 Atherosclerotic heart disease of native coronary artery without angina pectoris: Secondary | ICD-10-CM | POA: Diagnosis not present

## 2023-05-09 DIAGNOSIS — E1059 Type 1 diabetes mellitus with other circulatory complications: Secondary | ICD-10-CM

## 2023-05-09 DIAGNOSIS — I69354 Hemiplegia and hemiparesis following cerebral infarction affecting left non-dominant side: Secondary | ICD-10-CM | POA: Diagnosis not present

## 2023-05-09 DIAGNOSIS — S90421D Blister (nonthermal), right great toe, subsequent encounter: Secondary | ICD-10-CM | POA: Diagnosis not present

## 2023-05-09 LAB — CULTURE, BLOOD (ROUTINE X 2)
Culture  Setup Time: NO GROWTH
Special Requests: ADEQUATE

## 2023-05-09 NOTE — Telephone Encounter (Signed)
 Copied from CRM [id number]. Topic: Clinical - Home Health Verbal Orders >> May 09, 2023  3:47 PM Kinnie DEL wrote: Caller/Agency: [facility] Callback Number: [phone] Service Requested: Physical Therapy Frequency: 2 week 2 1 week 2  Any new concerns about the patient? No

## 2023-05-09 NOTE — Telephone Encounter (Signed)
 Verbal orders given

## 2023-05-09 NOTE — Transitions of Care (Post Inpatient/ED Visit) (Signed)
 05/09/2023  Name: Lauren [name] MRN: [id number] DOB: [birth year]  Today's TOC FU Call Status: Today's TOC FU Call Status:: Successful TOC FU Call Completed TOC FU Call Complete Date: 05/09/23 Patient's Name and Date of Birth confirmed.  Transition Care Management Follow-up Telephone Call How have you been since you were released from the hospital?: Better (Reports she is feeling better. CBG readings around 150.) Any questions or concerns?: No  Items Reviewed: Did you receive and understand the discharge instructions provided?: Yes Medications obtained,verified, and reconciled?: Yes (Medications Reviewed) Any new allergies since your discharge?: No Dietary orders reviewed?: Yes Type of Diet Ordered:: DM diet Do you have support at home?: Yes People in Home: significant other  Medications Reviewed Today: Medications Reviewed Today     Reviewed by Rumalda Alan PENNER, RN (Registered Nurse) on 05/09/23 at 1452  Med List Status: <None>   Medication Order Taking? Sig Documenting Provider Last Dose Status Informant  acetaminophen  (TYLENOL ) 325 MG tablet [id number] Yes Take 1-2 tablets (325-650 mg total) by mouth every 4 (four) hours as needed for mild pain.  Patient taking differently: Take 650 mg by mouth as needed for mild pain (pain score 1-3).   Maurice Sharlet RAMAN, PA-C Taking Active Spouse/Significant Other, Pharmacy Records  atorvastatin  (LIPITOR) 40 MG tablet [id number]  TAKE 1 TABLET BY MOUTH ONCE DAILY. Melvenia Manus BRAVO, MD  Active Spouse/Significant Other, Pharmacy Records  clonazePAM  (KLONOPIN ) 1 MG disintegrating tablet [id number] No Take 1 tablet (1 mg total) by mouth 2 (two) times daily as needed for seizure (For seizure lasting more than 2 minutes.).  Patient not taking: Reported on 05/09/2023   Pearlean Manus, MD Not Taking Active Spouse/Significant Other, Pharmacy Records           Med Note JACKOLYN WADDELL DEL   Qmp Apr 29, 2023  2:23 PM) Spouse unsure of medication.   clopidogrel   (PLAVIX ) 75 MG tablet [id number] Yes Take 1 tablet (75 mg total) by mouth daily. Sethi, Pramod S, MD Taking Active Spouse/Significant Other, Pharmacy Records  Continuous Glucose Sensor (DEXCOM G6 SENSOR) [place] [id number] Yes CHANGE SENSOR EVERY 10 DAYS. Therisa Benton PARAS, NP Taking Active Spouse/Significant Other, Pharmacy Records  Continuous Glucose Transmitter (DEXCOM G6 TRANSMITTER) MISC [id number] Yes Change transmitter every 90 days as directed. Nida, Gebreselassie W, MD Taking Active Spouse/Significant Other, Pharmacy Records  enoxaparin  (LOVENOX ) 100 MG/ML injection [id number]  Inject 1 mL (100 mg total) into the skin 2 (two) times daily for 3 days. Laurence Locus, DO  Expired 05/06/23 2359   furosemide  (LASIX ) 40 MG tablet [id number] Yes TAKE 1 TABLET BY MOUTH ONCE DAILY AS NEEDED FOR  SWELLING Laurence Locus, DO Taking Active   hydrALAZINE  (APRESOLINE ) 25 MG tablet [id number] Yes Take 1 tablet (25 mg total) by mouth in the morning and at bedtime. Ricky Fines, MD Taking Active Spouse/Significant Other, Pharmacy Records  insulin  aspart (NOVOLOG ) 100 UNIT/ML injection [id number] Yes Use with Omnipod for daily dose around 100 units daily  Patient taking differently: Inject 0-200 Units into the skin See admin instructions. Load Omnipod with 200 units every 3-4 days.   Therisa Benton PARAS, NP Taking Active Spouse/Significant Other, Pharmacy Records  Insulin  Disposable Pump (OMNIPOD 5 DEXG7G6 PODS GEN 5) MISC [id number] Yes Change pod every 48-72 hours. Therisa Benton PARAS, NP Taking Active Spouse/Significant Other, Pharmacy Records  isosorbide  mononitrate (IMDUR ) 60 MG 24 hr tablet [id number] Yes TAKE 2 TABLETS BY MOUTH ONCE DAILY. Melvenia Manus BRAVO, MD Taking Active Spouse/Significant Other, Pharmacy Records  levothyroxine  (SYNTHROID )  125 MCG tablet [id number] Yes Take 1 tablet (125 mcg total) by mouth daily at 6 (six) AM. Therisa Benton PARAS, NP Taking Active Spouse/Significant Other, Pharmacy Records  metoprolol   succinate (TOPROL -XL) 50 MG 24 hr tablet [id number] Yes Take 1 tablet (50 mg total) by mouth daily. Take with or immediately following a meal. Laurence Locus, DO Taking Active   Multiple Vitamin (MULTIVITAMIN WITH MINERALS) TABS tablet [id number] Yes Take 1 tablet by mouth daily. Maurice Sharlet RAMAN, PA-C Taking Active Spouse/Significant Other, Pharmacy Records           Med Note JACKOLYN WADDELL DEL   Fri Apr 29, 2023  2:14 PM) >30 days  nitroGLYCERIN  (NITROSTAT ) 0.4 MG SL tablet [id number] No Place 1 tablet (0.4 mg total) under the tongue every 5 (five) minutes x 3 doses as needed for chest pain.  Patient not taking: Reported on 05/09/2023   Maurice Sharlet RAMAN, PA-C Not Taking Active Spouse/Significant Other, Pharmacy Records  oxyCODONE  (OXY IR/ROXICODONE ) 5 MG immediate release tablet [id number] Yes Take 1 tablet (5 mg total) by mouth every 6 (six) hours as needed for moderate pain (pain score 4-6). Laurence Locus, DO Taking Active   pantoprazole  (PROTONIX ) 40 MG tablet [id number]  Take 1 tablet (40 mg total) by mouth daily. Courtesy fill/ pt to get established with a primary MD for further refills  Patient taking differently: Take 40 mg by mouth daily as needed (reflux).   Melvenia Manus BRAVO, MD  Expired 04/29/23 2359 Spouse/Significant Other, Pharmacy Records  ranolazine  (RANEXA ) 1000 MG SR tablet [id number] Yes TAKE 1  BY MOUTH TWICE DAILY Dixon, Phillip E, MD Taking Active Spouse/Significant Other, Pharmacy Records  senna-docusate (SENOKOT-S) 8.6-50 MG tablet [id number] Yes Take 1-2 tablets by mouth daily as needed for mild constipation. Golda Lynwood PARAS, MD Taking Active Spouse/Significant Other, Pharmacy Records           Med Note JACKOLYN WADDELL DEL   Fri Apr 29, 2023  2:25 PM) Unk last dose.   sodium bicarbonate  650 MG tablet [id number] Yes Take 1 tablet (650 mg total) by mouth 2 (two) times daily. Laurence Locus, DO Taking Active   tiZANidine  (ZANAFLEX ) 4 MG tablet [id number] Yes Take 1 tablet (4 mg total) by mouth at  bedtime. Melvenia Manus BRAVO, MD Taking Active Spouse/Significant Other, Pharmacy Records  topiramate  (TOPAMAX ) 50 MG tablet [id number] Yes Take 1 tablet (50 mg total) by mouth 2 (two) times daily. Pearlean Manus, MD Taking Active Spouse/Significant Other, Pharmacy Records  traZODone  (DESYREL ) 100 MG tablet [id number] Yes Take 100 mg by mouth at bedtime. [provider] Taking Active Spouse/Significant Other, Pharmacy Records  warfarin (COUMADIN ) 5 MG tablet [id number] Yes TAKE ONE TABLET BY MOUTH DAILY EXCEPT 1/2 TABLET ON WEDNESDAYS AND SATURDAYS OR AS DIRECTED  Patient taking differently: Take 2.5-5 mg by mouth daily. Take 5mg  (1 tablet) daily, except on Tuesday, Thursday, and Saturday's take 2.5mg  (1/2 tablet).   Alvan Dorn FALCON, MD Taking Active Spouse/Significant Other, Pharmacy Records            Home Care and Equipment/Supplies: Were Home Health Services Ordered?: Yes Name of Home Health Agency:: [facility] Has Agency set up a time to come to your home?: Yes First Home Health Visit Date: 05/07/23 Any new equipment or medical supplies ordered?: Yes (cane.  .Wants a electric wheelchair) Were you able to get the equipment/medical supplies?: Yes Do you have any questions related to the use of the equipment/supplies?: No  Functional Questionnaire: Do you need assistance with bathing/showering  or dressing?: Yes (family) Do you need assistance with meal preparation?: Yes (family) Do you need assistance with eating?: No Do you have difficulty maintaining continence: No Do you need assistance with getting out of bed/getting out of a chair/moving?: Yes (cane) Do you have difficulty managing or taking your medications?: No  Follow up appointments reviewed: PCP Follow-up appointment confirmed?: Yes Date of PCP follow-up appointment?: 05/23/23 Follow-up Provider: PCP Specialist Hospital Follow-up appointment confirmed?: Yes Date of Specialist follow-up appointment?:  05/12/23 Follow-Up Specialty Provider:: ortho Do you need transportation to your follow-up appointment?: No ([employer] transportaion. patient instructioned to call.) Do you understand care options if your condition(s) worsen?: Yes-patient verbalized understanding  SDOH Interventions Today    Flowsheet Row Most Recent Value  SDOH Interventions   Food Insecurity Interventions Other (Comment)  [referral to SW]  Housing Interventions Intervention Not Indicated  Transportation Interventions Other (Comment)  [encouraged patient to call her insurance company to inquire about transportation benefits.]      Patient reports that she is doing well. Reports PT just arrived for her home visit. States that she could use help with transportation and food.  Referral put in for SW for SDOH.  Offered patient 30 day TOC program and she declined.  Interventions Today    Flowsheet Row Most Recent Value  Chronic Disease   Chronic disease during today's visit Diabetes  General Interventions   General Interventions Discussed/Reviewed General Interventions Reviewed, General Interventions Discussed, Durable Medical Equipment (DME)  Durable Medical Equipment (DME) Glucomoter, Insulin  Pump  Exercise Interventions   Exercise Discussed/Reviewed Exercise Discussed  [PT arrived during call]  Education Interventions   Education Provided Provided Education  Provided Verbal Education On Insurance Plans  [encouraged patinet to call her insurance carrier about her benefits.]  Nutrition Interventions   Nutrition Discussed/Reviewed Carbohydrate meal planning  Pharmacy Interventions   Pharmacy Dicussed/Reviewed Medications and their functions       Alan Ee, RN, BSN, [employer] Population Health- Transition of Care Team.  [facility] [phone]

## 2023-05-09 NOTE — Telephone Encounter (Signed)
 Copied from CRM [id number]. Topic: Clinical - Home Health Verbal Orders >> May 09, 2023  2:06 PM Graeme ORN wrote: Caller/Agency: Sarah Nurse with [facility]  Callback Number:  [phone] Service Requested:  1. Home health nurse once per week for 4 weeks for disease and medication management  2. Occupational therapy evaluation  3. Social Worker evaluation Frequency: Nursing for 1 time per weeks for 4 weeks and assess for OT and Child psychotherapist.  Any new concerns about the patient? N/A

## 2023-05-10 ENCOUNTER — Ambulatory Visit: Payer: 59 | Attending: Cardiology | Admitting: *Deleted

## 2023-05-10 ENCOUNTER — Telehealth (HOSPITAL_BASED_OUTPATIENT_CLINIC_OR_DEPARTMENT_OTHER): Payer: Self-pay

## 2023-05-10 DIAGNOSIS — Z5181 Encounter for therapeutic drug level monitoring: Secondary | ICD-10-CM | POA: Diagnosis not present

## 2023-05-10 DIAGNOSIS — I639 Cerebral infarction, unspecified: Secondary | ICD-10-CM

## 2023-05-10 LAB — POCT INR: INR: 5.9 — AB (ref 2.0–3.0)

## 2023-05-10 LAB — CULTURE, BLOOD (ROUTINE X 2): Culture: NO GROWTH

## 2023-05-10 NOTE — Telephone Encounter (Signed)
 Post ED Visit - Positive Culture Follow-up  Culture report reviewed by antimicrobial stewardship pharmacist: [facility] []  Rankin Dee, Pharm.D. []  Venetia Gully, Pharm.D., BCPS AQ-ID []  Garrel Crews, Pharm.D., BCPS []  Almarie Lunger, Pharm.D., BCPS []  Ruby, [address].D., BCPS, AAHIVP []  Rosaline Bihari, Pharm.D., BCPS, AAHIVP []  Vernell Meier, PharmD, BCPS []  Latanya Hint, PharmD, BCPS []  Donald Medley, PharmD, BCPS []  Rocky Bold, PharmD []  Dorothyann Alert, PharmD, BCPS []  Morene Babe, PharmD OLEGARIO Gaines Carrier, PharmD  [facility] []  Rosaline Edison, PharmD []  Romona Bliss, PharmD []  Dolphus Roller, PharmD []  Veva Seip, Rph []  Vernell Daunt) Leonce, PharmD []  Eva Allis, PharmD []  Rosaline Millet, PharmD []  Iantha Batch, PharmD []  Arvin Gauss, PharmD []  Wanda Hasting, PharmD []  Ronal Rav, PharmD []  Rocky Slade, PharmD []  Bard Jeans, PharmD   Positive Blood culture 1/4 Coag neg staph, eval by MD likely contaminant OK  Gretta Avelina Buss 05/10/2023, 11:38 AM

## 2023-05-10 NOTE — Patient Instructions (Signed)
 Hold warfarin x 3 days then resume 1 tablet daily except 1/2 tablet on Tuesdays, Thursdays and Saturdays Recheck in 1 wk

## 2023-05-11 ENCOUNTER — Telehealth: Payer: Self-pay | Admitting: Internal Medicine

## 2023-05-11 ENCOUNTER — Telehealth: Payer: Self-pay | Admitting: *Deleted

## 2023-05-11 ENCOUNTER — Telehealth: Payer: Self-pay

## 2023-05-11 DIAGNOSIS — S90421D Blister (nonthermal), right great toe, subsequent encounter: Secondary | ICD-10-CM | POA: Diagnosis not present

## 2023-05-11 DIAGNOSIS — G8320 Monoplegia of upper limb affecting unspecified side: Secondary | ICD-10-CM | POA: Insufficient documentation

## 2023-05-11 DIAGNOSIS — S42302D Unspecified fracture of shaft of humerus, left arm, subsequent encounter for fracture with routine healing: Secondary | ICD-10-CM | POA: Diagnosis not present

## 2023-05-11 DIAGNOSIS — I69354 Hemiplegia and hemiparesis following cerebral infarction affecting left non-dominant side: Secondary | ICD-10-CM | POA: Diagnosis not present

## 2023-05-11 DIAGNOSIS — I214 Non-ST elevation (NSTEMI) myocardial infarction: Secondary | ICD-10-CM | POA: Diagnosis not present

## 2023-05-11 DIAGNOSIS — I2510 Atherosclerotic heart disease of native coronary artery without angina pectoris: Secondary | ICD-10-CM | POA: Diagnosis not present

## 2023-05-11 NOTE — Telephone Encounter (Signed)
 Copied from CRM [id number]. Topic: General - Other >> May 11, 2023  3:33 PM Geneva B wrote: Reason for CRM: home health is calling needing a new order for wound care for right toe call back [phone] as well as report that patient had a fall and hurt tail bone

## 2023-05-11 NOTE — Progress Notes (Signed)
 Complex Care Management Note  Care Guide Note 05/11/2023 Name: Illyana Schorsch MRN: [id number] DOB: October 27, 1980  Sharilynn Cassity is a 43 y.o. year old female who sees Melvenia, Manus BRAVO, MD for primary care. I reached out to Arby Slot by phone today to offer complex care management services.  Ms. Jakubek was given information about Complex Care Management services today including:   The Complex Care Management services include support from the care team which includes your Nurse Coordinator, Clinical Social Worker, or Pharmacist.  The Complex Care Management team is here to help remove barriers to the health concerns and goals most important to you. Complex Care Management services are voluntary, and the patient may decline or stop services at any time by request to their care team member.   Complex Care Management Consent Status: Patient did not agree to participate in complex care management services at this time.    Encounter Outcome:  Patient Refused  [facility]  Care Coordination Care Guide  Direct Dial: [phone]

## 2023-05-11 NOTE — Telephone Encounter (Signed)
 Copied from CRM [id number]. Topic: Referral - Status >> May 11, 2023  3:06 PM Carlatta H wrote:   Reason for CRM: Dickey from home health is requesting verbal authorization for 2 more home visit//From speaking with the paitent she is having signs of depression, very said and spoke of sucidial thoughts//She did not have plans to act on it at this moment//Please call [phone] for verbal authorization

## 2023-05-11 NOTE — Progress Notes (Signed)
  Intake history:  LMP 05/05/2023 (Exact Date)  There is no height or weight on file to calculate BMI.    WHAT ARE WE SEEING YOU FOR TODAY?   left shoulder  How long has this bothered you? (DOI?DOS?WS?)  04/25/23  Anticoag. WARFARIN   Diabetes Yes  Heart disease Yes  Hypertension Yes  SMOKING HX No/ former  Kidney disease Yes  Any ALLERGIES ______________________________________________   Treatment:  Have you taken:  Tylenol  Yes  Advil No  Had PT No  Had injection No  Other  ___oxycodone ______________________

## 2023-05-11 NOTE — Telephone Encounter (Signed)
 Verbal orders provided.

## 2023-05-12 ENCOUNTER — Ambulatory Visit (INDEPENDENT_AMBULATORY_CARE_PROVIDER_SITE_OTHER): Payer: Self-pay | Admitting: Orthopedic Surgery

## 2023-05-12 ENCOUNTER — Other Ambulatory Visit: Payer: Self-pay | Admitting: Cardiology

## 2023-05-12 ENCOUNTER — Other Ambulatory Visit: Payer: Self-pay | Admitting: Internal Medicine

## 2023-05-12 ENCOUNTER — Other Ambulatory Visit (INDEPENDENT_AMBULATORY_CARE_PROVIDER_SITE_OTHER): Payer: Self-pay

## 2023-05-12 DIAGNOSIS — S42202A Unspecified fracture of upper end of left humerus, initial encounter for closed fracture: Secondary | ICD-10-CM | POA: Diagnosis not present

## 2023-05-12 MED ORDER — OXYCODONE-ACETAMINOPHEN 10-325 MG PO TABS: 1.0000 | ORAL_TABLET | ORAL | 0 refills | Status: DC | PRN

## 2023-05-12 NOTE — Progress Notes (Signed)
  Subjective:     Patient ID: Lauren [name], female   DOB: 28-Oct-1980, 43 y.o.   MRN: [id number]  43 year old female history of CVA left side comes in with left proximal humerus fracture  Complains pain and swelling left shoulder  Patient on chronic opioid therapy 7 and half milligrams was placed on 5 mg oxycodone  complains that the medication is not controlling pain  Past Medical History: 01/01/2020: Acute ischemic stroke (HCC) 01/01/2020: Acute kidney injury superimposed on CKD (HCC) 01/28/2022: Acute right MCA stroke (HCC) No date: Anemia No date: CAD (coronary artery disease)     Comment:  a. s/p cath in 03/2014 showing 30% mid-LAD, moderate to               severe disease along small D1, patent LCx, moderate to               severe distal OM2 stenosis and moderate diffuse diease               along RCA not amenable to PCI 12/31/2019: Cerebrovascular accident (CVA) (HCC) No date: CHF (congestive heart failure) (HCC)     Comment:  a. EF 55-60% in 12/2019 b. EF at 35-40% by echo in               05/2020 No date: CKD (chronic kidney disease) stage 4, GFR 15-29 ml/min (HCC) No date: Diabetes mellitus without complication (HCC) No date: Myocardial infarction (HCC) No date: Neuropathy No date: PVD (peripheral vascular disease) (HCC) 01/2022: Stroke (HCC)     Comment:  L hand weakness      Review of Systems sided hemiparesis, balance disturbance     Objective:   Physical Exam Patient presents in wheelchair is alert and awake  Bruising and ecchymosis in the lower part of the left arm near the elbow; Flail left arm  Neurovascular exam intact  Outside imaging shows a proximal humerus fracture left shoulder no dislocation this was an outside image and I have reviewed it and read it and that is my interpretation  DG Shoulder Left Result Date: 05/12/2023 Images of the left shoulder Proximal humerus fracture Slightly comminuted fracture neck of the left proximal humerus No  major change noted from the x-ray taken at the time of injury Proximal humerus fracture meets criteria for nondisplaced fracture       Assessment:     Encounter Diagnosis  Name Primary?   Closed fracture of proximal end of left humerus, unspecified fracture morphology, initial encounter Yes       Plan:     Recommend nonoperative treatment based on the patient's medical condition and position of the fracture  Recommend x-ray in 2 weeks  Continue sling and swath which was repositioned and patient and family member educated on how to apply  Meds ordered this encounter  Medications   oxyCODONE -acetaminophen  (PERCOCET) 10-325 MG tablet    Sig: Take 1 tablet by mouth every 4 (four) hours as needed for pain.    Dispense:  30 tablet    Refill:  0

## 2023-05-13 ENCOUNTER — Other Ambulatory Visit: Payer: Self-pay | Admitting: Cardiology

## 2023-05-13 ENCOUNTER — Other Ambulatory Visit (HOSPITAL_COMMUNITY): Payer: Self-pay

## 2023-05-13 ENCOUNTER — Other Ambulatory Visit: Payer: Self-pay | Admitting: Internal Medicine

## 2023-05-13 ENCOUNTER — Other Ambulatory Visit: Payer: Self-pay | Admitting: Nurse Practitioner

## 2023-05-13 DIAGNOSIS — I639 Cerebral infarction, unspecified: Secondary | ICD-10-CM

## 2023-05-13 DIAGNOSIS — I214 Non-ST elevation (NSTEMI) myocardial infarction: Secondary | ICD-10-CM | POA: Diagnosis not present

## 2023-05-13 DIAGNOSIS — I2510 Atherosclerotic heart disease of native coronary artery without angina pectoris: Secondary | ICD-10-CM | POA: Diagnosis not present

## 2023-05-13 DIAGNOSIS — I69354 Hemiplegia and hemiparesis following cerebral infarction affecting left non-dominant side: Secondary | ICD-10-CM | POA: Diagnosis not present

## 2023-05-13 DIAGNOSIS — S42302D Unspecified fracture of shaft of humerus, left arm, subsequent encounter for fracture with routine healing: Secondary | ICD-10-CM | POA: Diagnosis not present

## 2023-05-13 DIAGNOSIS — S90421D Blister (nonthermal), right great toe, subsequent encounter: Secondary | ICD-10-CM | POA: Diagnosis not present

## 2023-05-13 NOTE — Telephone Encounter (Signed)
 Copied from CRM [id number]. Topic: Clinical - Medication Refill >> May 13, 2023  5:12 PM Zebedee SAUNDERS wrote: Most Recent Primary Care Visit:  Provider: DIXON, PHILLIP E  Department: [facility]  Visit Type: OFFICE VISIT  Date: 02/18/2023  Medication: clopidogrel  (PLAVIX ) 75 MG tablet, pantoprazole  (PROTONIX ) 40 MG tablet, sodium bicarbonate  650 MG tablet, clonazePAM  (KLONOPIN ) 1 MG disintegrating tablet,   Has the patient contacted their pharmacy?  (Agent: If no, request that the patient contact the pharmacy for the refill. If patient does not wish to contact the pharmacy document the reason why and proceed with request.) (Agent: If yes, when and what did the pharmacy advise?)  Is this the correct pharmacy for this prescription?  If no, delete pharmacy and type the correct one.  This is the patient's preferred pharmacy:  [facility] [address], [place] - [address] [place] [address] Phone: [phone] Fax: [phone]  [facility] [place], [place] - [address] [place] [address] Phone: [phone] Fax: [phone]  [facility] - [address], [place] - [address] [place] [address] Phone: [phone] Fax: [phone]  [facility] [place]  - [place], [place] - [address] [place] [address] Phone: [phone] Fax: [phone]   Has the prescription been filled recently?   Is the patient out of the medication?   Has the patient been seen for an appointment in the last year OR does the patient have an upcoming appointment?   Can we respond through MyChart?   Agent: Please be advised that Rx refills may take up to 3 business days. We ask that you follow-up with your pharmacy.

## 2023-05-16 ENCOUNTER — Other Ambulatory Visit: Payer: Self-pay | Admitting: Internal Medicine

## 2023-05-16 ENCOUNTER — Other Ambulatory Visit (HOSPITAL_BASED_OUTPATIENT_CLINIC_OR_DEPARTMENT_OTHER): Payer: Self-pay

## 2023-05-16 ENCOUNTER — Other Ambulatory Visit: Payer: Self-pay

## 2023-05-16 ENCOUNTER — Other Ambulatory Visit (HOSPITAL_COMMUNITY): Payer: Self-pay

## 2023-05-16 DIAGNOSIS — I639 Cerebral infarction, unspecified: Secondary | ICD-10-CM

## 2023-05-16 DIAGNOSIS — R112 Nausea with vomiting, unspecified: Secondary | ICD-10-CM

## 2023-05-16 MED ORDER — SODIUM BICARBONATE 650 MG PO TABS: 650.0000 mg | ORAL_TABLET | Freq: Two times a day (BID) | ORAL | 0 refills | Status: AC

## 2023-05-16 MED ORDER — NITROGLYCERIN 0.4 MG SL SUBL: 0.4000 mg | SUBLINGUAL_TABLET | SUBLINGUAL | 3 refills | Status: DC | PRN

## 2023-05-16 MED ORDER — CLONAZEPAM 1 MG PO TBDP: 1.0000 mg | ORAL_TABLET | Freq: Two times a day (BID) | ORAL | 0 refills | Status: DC | PRN

## 2023-05-16 MED ORDER — DEXCOM G6 SENSOR MISC
1 refills | Status: DC
  Filled 2023-05-16: qty 3, 30d supply, fill #0
  Filled 2023-06-17 (×2): qty 3, 30d supply, fill #1

## 2023-05-16 MED ORDER — PANTOPRAZOLE SODIUM 40 MG PO TBEC: 40.0000 mg | DELAYED_RELEASE_TABLET | Freq: Every day | ORAL | 1 refills | Status: DC

## 2023-05-16 MED ORDER — SENNOSIDES-DOCUSATE SODIUM 8.6-50 MG PO TABS
1.0000 | ORAL_TABLET | Freq: Every day | ORAL | 0 refills | Status: DC | PRN
Start: 2023-05-16 — End: 2023-07-01

## 2023-05-16 MED ORDER — CLOPIDOGREL BISULFATE 75 MG PO TABS: 75.0000 mg | ORAL_TABLET | Freq: Every day | ORAL | 11 refills | Status: DC

## 2023-05-16 MED ORDER — ADULT MULTIVITAMIN W/MINERALS CH: 1.0000 | ORAL_TABLET | Freq: Every day | ORAL | Status: DC

## 2023-05-16 NOTE — Telephone Encounter (Signed)
 Copied from CRM [id number]. Topic: Clinical - Medication Refill >> May 16, 2023  2:57 PM Susanna ORN wrote: Most Recent Primary Care Visit:  Provider: DIXON, PHILLIP E  Department: [facility]  Visit Type: OFFICE VISIT  Date: 02/18/2023  Medication: acetaminophen  (TYLENOL ) 325 MG tablet, clonazePAM  (KLONOPIN ) 1 MG disintegrating tablet, clopidogrel  (PLAVIX ) 75 MG tablet, Multiple Vitamin (MULTIVITAMIN WITH MINERALS) TABS tablet, pantoprazole  (PROTONIX ) 40 MG tablet, senna-docusate (SENOKOT-S) 8.6-50 MG tablet, sodium bicarbonate  650 MG tablet, traZODone  (DESYREL ) 100 MG tablet  Has the patient contacted their pharmacy? Yes, pharmacy called to request refills. (Agent: If no, request that the patient contact the pharmacy for the refill. If patient does not wish to contact the pharmacy document the reason why and proceed with request.) (Agent: If yes, when and what did the pharmacy advise?)  Is this the correct pharmacy for this prescription? Yes If no, delete pharmacy and type the correct one.  This is the patient's preferred pharmacy:    [facility] [place]  [place], [place] - [address] [place] [address] Phone: [phone] Fax: [phone]   Has the prescription been filled recently? Yes  Is the patient out of the medication? Yes  Has the patient been seen for an appointment in the last year OR does the patient have an upcoming appointment?   Can we respond through MyChart?   Agent: Please be advised that Rx refills may take up to 3 business days. We ask that you follow-up with your pharmacy.

## 2023-05-16 NOTE — Telephone Encounter (Signed)
 Copied from CRM [id number]. Topic: Clinical - Prescription Issue >> May 16, 2023  3:23 PM Susanna ORN wrote: Reason for CRM: Sent a rx refill request for acetaminophen  (TYLENOL ) 325 MG tablet in previous CRM. Did not pend order due to message stating patient takes a different dosage (650 mg) but Karna with [facility] called in the 325 mg . Needs clarification.

## 2023-05-17 ENCOUNTER — Inpatient Hospital Stay (HOSPITAL_COMMUNITY)
Admission: EM | Admit: 2023-05-17 | Discharge: 2023-05-28 | DRG: 673 | Discharge status: Against Medical Advice | Payer: 59 | Attending: Internal Medicine | Admitting: Internal Medicine

## 2023-05-17 ENCOUNTER — Emergency Department (HOSPITAL_COMMUNITY): Payer: 59

## 2023-05-17 ENCOUNTER — Other Ambulatory Visit: Payer: Self-pay

## 2023-05-17 ENCOUNTER — Other Ambulatory Visit (HOSPITAL_COMMUNITY): Payer: Self-pay

## 2023-05-17 ENCOUNTER — Encounter (HOSPITAL_COMMUNITY): Payer: Self-pay | Admitting: Emergency Medicine

## 2023-05-17 DIAGNOSIS — Z5941 Food insecurity: Secondary | ICD-10-CM

## 2023-05-17 DIAGNOSIS — E1051 Type 1 diabetes mellitus with diabetic peripheral angiopathy without gangrene: Secondary | ICD-10-CM | POA: Diagnosis present

## 2023-05-17 DIAGNOSIS — M869 Osteomyelitis, unspecified: Secondary | ICD-10-CM

## 2023-05-17 DIAGNOSIS — G40909 Epilepsy, unspecified, not intractable, without status epilepticus: Secondary | ICD-10-CM | POA: Diagnosis not present

## 2023-05-17 DIAGNOSIS — E1065 Type 1 diabetes mellitus with hyperglycemia: Secondary | ICD-10-CM | POA: Diagnosis present

## 2023-05-17 DIAGNOSIS — S42202A Unspecified fracture of upper end of left humerus, initial encounter for closed fracture: Secondary | ICD-10-CM | POA: Insufficient documentation

## 2023-05-17 DIAGNOSIS — E872 Acidosis, unspecified: Secondary | ICD-10-CM | POA: Diagnosis not present

## 2023-05-17 DIAGNOSIS — L97519 Non-pressure chronic ulcer of other part of right foot with unspecified severity: Secondary | ICD-10-CM | POA: Diagnosis not present

## 2023-05-17 DIAGNOSIS — Z803 Family history of malignant neoplasm of breast: Secondary | ICD-10-CM

## 2023-05-17 DIAGNOSIS — S42202D Unspecified fracture of upper end of left humerus, subsequent encounter for fracture with routine healing: Secondary | ICD-10-CM

## 2023-05-17 DIAGNOSIS — K219 Gastro-esophageal reflux disease without esophagitis: Secondary | ICD-10-CM | POA: Diagnosis not present

## 2023-05-17 DIAGNOSIS — Z881 Allergy status to other antibiotic agents status: Secondary | ICD-10-CM

## 2023-05-17 DIAGNOSIS — I7090 Unspecified atherosclerosis: Secondary | ICD-10-CM | POA: Diagnosis not present

## 2023-05-17 DIAGNOSIS — D631 Anemia in chronic kidney disease: Secondary | ICD-10-CM | POA: Diagnosis not present

## 2023-05-17 DIAGNOSIS — E1022 Type 1 diabetes mellitus with diabetic chronic kidney disease: Secondary | ICD-10-CM | POA: Diagnosis present

## 2023-05-17 DIAGNOSIS — E876 Hypokalemia: Secondary | ICD-10-CM | POA: Diagnosis not present

## 2023-05-17 DIAGNOSIS — E10649 Type 1 diabetes mellitus with hypoglycemia without coma: Secondary | ICD-10-CM | POA: Diagnosis present

## 2023-05-17 DIAGNOSIS — Z8349 Family history of other endocrine, nutritional and metabolic diseases: Secondary | ICD-10-CM

## 2023-05-17 DIAGNOSIS — I2510 Atherosclerotic heart disease of native coronary artery without angina pectoris: Secondary | ICD-10-CM | POA: Diagnosis not present

## 2023-05-17 DIAGNOSIS — R079 Chest pain, unspecified: Secondary | ICD-10-CM | POA: Diagnosis not present

## 2023-05-17 DIAGNOSIS — R7989 Other specified abnormal findings of blood chemistry: Secondary | ICD-10-CM | POA: Diagnosis present

## 2023-05-17 DIAGNOSIS — R609 Edema, unspecified: Secondary | ICD-10-CM | POA: Diagnosis not present

## 2023-05-17 DIAGNOSIS — I130 Hypertensive heart and chronic kidney disease with heart failure and stage 1 through stage 4 chronic kidney disease, or unspecified chronic kidney disease: Secondary | ICD-10-CM | POA: Diagnosis present

## 2023-05-17 DIAGNOSIS — E039 Hypothyroidism, unspecified: Secondary | ICD-10-CM | POA: Diagnosis present

## 2023-05-17 DIAGNOSIS — Z794 Long term (current) use of insulin: Secondary | ICD-10-CM | POA: Diagnosis not present

## 2023-05-17 DIAGNOSIS — Z8614 Personal history of Methicillin resistant Staphylococcus aureus infection: Secondary | ICD-10-CM

## 2023-05-17 DIAGNOSIS — E1169 Type 2 diabetes mellitus with other specified complication: Secondary | ICD-10-CM | POA: Diagnosis not present

## 2023-05-17 DIAGNOSIS — E66812 Obesity, class 2: Secondary | ICD-10-CM | POA: Diagnosis not present

## 2023-05-17 DIAGNOSIS — G9389 Other specified disorders of brain: Secondary | ICD-10-CM | POA: Diagnosis present

## 2023-05-17 DIAGNOSIS — E1042 Type 1 diabetes mellitus with diabetic polyneuropathy: Secondary | ICD-10-CM | POA: Diagnosis not present

## 2023-05-17 DIAGNOSIS — Z6836 Body mass index (BMI) 36.0-36.9, adult: Secondary | ICD-10-CM

## 2023-05-17 DIAGNOSIS — Z5982 Transportation insecurity: Secondary | ICD-10-CM

## 2023-05-17 DIAGNOSIS — I10 Essential (primary) hypertension: Secondary | ICD-10-CM | POA: Diagnosis not present

## 2023-05-17 DIAGNOSIS — Z8249 Family history of ischemic heart disease and other diseases of the circulatory system: Secondary | ICD-10-CM

## 2023-05-17 DIAGNOSIS — I517 Cardiomegaly: Secondary | ICD-10-CM | POA: Diagnosis not present

## 2023-05-17 DIAGNOSIS — E860 Dehydration: Secondary | ICD-10-CM | POA: Insufficient documentation

## 2023-05-17 DIAGNOSIS — R413 Other amnesia: Secondary | ICD-10-CM | POA: Diagnosis present

## 2023-05-17 DIAGNOSIS — Z83438 Family history of other disorder of lipoprotein metabolism and other lipidemia: Secondary | ICD-10-CM

## 2023-05-17 DIAGNOSIS — R600 Localized edema: Secondary | ICD-10-CM | POA: Diagnosis not present

## 2023-05-17 DIAGNOSIS — N184 Chronic kidney disease, stage 4 (severe): Secondary | ICD-10-CM | POA: Diagnosis present

## 2023-05-17 DIAGNOSIS — I6381 Other cerebral infarction due to occlusion or stenosis of small artery: Secondary | ICD-10-CM | POA: Diagnosis not present

## 2023-05-17 DIAGNOSIS — R29810 Facial weakness: Secondary | ICD-10-CM | POA: Diagnosis not present

## 2023-05-17 DIAGNOSIS — G9341 Metabolic encephalopathy: Secondary | ICD-10-CM | POA: Diagnosis not present

## 2023-05-17 DIAGNOSIS — G8194 Hemiplegia, unspecified affecting left nondominant side: Secondary | ICD-10-CM | POA: Diagnosis not present

## 2023-05-17 DIAGNOSIS — M7989 Other specified soft tissue disorders: Secondary | ICD-10-CM | POA: Diagnosis not present

## 2023-05-17 DIAGNOSIS — W19XXXD Unspecified fall, subsequent encounter: Secondary | ICD-10-CM | POA: Diagnosis not present

## 2023-05-17 DIAGNOSIS — R0989 Other specified symptoms and signs involving the circulatory and respiratory systems: Secondary | ICD-10-CM | POA: Diagnosis not present

## 2023-05-17 DIAGNOSIS — Z7902 Long term (current) use of antithrombotics/antiplatelets: Secondary | ICD-10-CM

## 2023-05-17 DIAGNOSIS — R569 Unspecified convulsions: Secondary | ICD-10-CM | POA: Diagnosis not present

## 2023-05-17 DIAGNOSIS — M86171 Other acute osteomyelitis, right ankle and foot: Secondary | ICD-10-CM | POA: Diagnosis not present

## 2023-05-17 DIAGNOSIS — Z5309 Procedure and treatment not carried out because of other contraindication: Secondary | ICD-10-CM | POA: Diagnosis not present

## 2023-05-17 DIAGNOSIS — Z66 Do not resuscitate: Secondary | ICD-10-CM | POA: Diagnosis not present

## 2023-05-17 DIAGNOSIS — I252 Old myocardial infarction: Secondary | ICD-10-CM

## 2023-05-17 DIAGNOSIS — L03115 Cellulitis of right lower limb: Secondary | ICD-10-CM

## 2023-05-17 DIAGNOSIS — N200 Calculus of kidney: Secondary | ICD-10-CM | POA: Diagnosis not present

## 2023-05-17 DIAGNOSIS — F17210 Nicotine dependence, cigarettes, uncomplicated: Secondary | ICD-10-CM | POA: Diagnosis present

## 2023-05-17 DIAGNOSIS — M609 Myositis, unspecified: Secondary | ICD-10-CM | POA: Diagnosis present

## 2023-05-17 DIAGNOSIS — I70229 Atherosclerosis of native arteries of extremities with rest pain, unspecified extremity: Secondary | ICD-10-CM | POA: Diagnosis present

## 2023-05-17 DIAGNOSIS — Z89422 Acquired absence of other left toe(s): Secondary | ICD-10-CM

## 2023-05-17 DIAGNOSIS — I5042 Chronic combined systolic (congestive) and diastolic (congestive) heart failure: Secondary | ICD-10-CM | POA: Diagnosis not present

## 2023-05-17 DIAGNOSIS — Z8673 Personal history of transient ischemic attack (TIA), and cerebral infarction without residual deficits: Secondary | ICD-10-CM

## 2023-05-17 DIAGNOSIS — L03113 Cellulitis of right upper limb: Secondary | ICD-10-CM | POA: Diagnosis not present

## 2023-05-17 DIAGNOSIS — N179 Acute kidney failure, unspecified: Secondary | ICD-10-CM | POA: Diagnosis not present

## 2023-05-17 DIAGNOSIS — R471 Dysarthria and anarthria: Secondary | ICD-10-CM | POA: Diagnosis not present

## 2023-05-17 DIAGNOSIS — R4182 Altered mental status, unspecified: Secondary | ICD-10-CM | POA: Diagnosis not present

## 2023-05-17 DIAGNOSIS — Z79899 Other long term (current) drug therapy: Secondary | ICD-10-CM

## 2023-05-17 DIAGNOSIS — I6521 Occlusion and stenosis of right carotid artery: Secondary | ICD-10-CM | POA: Diagnosis present

## 2023-05-17 DIAGNOSIS — Z91041 Radiographic dye allergy status: Secondary | ICD-10-CM

## 2023-05-17 DIAGNOSIS — Z9641 Presence of insulin pump (external) (internal): Secondary | ICD-10-CM | POA: Diagnosis present

## 2023-05-17 DIAGNOSIS — I63511 Cerebral infarction due to unspecified occlusion or stenosis of right middle cerebral artery: Secondary | ICD-10-CM | POA: Diagnosis not present

## 2023-05-17 DIAGNOSIS — I70234 Atherosclerosis of native arteries of right leg with ulceration of heel and midfoot: Secondary | ICD-10-CM | POA: Diagnosis not present

## 2023-05-17 DIAGNOSIS — Z95828 Presence of other vascular implants and grafts: Secondary | ICD-10-CM

## 2023-05-17 DIAGNOSIS — E11649 Type 2 diabetes mellitus with hypoglycemia without coma: Secondary | ICD-10-CM | POA: Insufficient documentation

## 2023-05-17 DIAGNOSIS — D75839 Thrombocytosis, unspecified: Secondary | ICD-10-CM | POA: Insufficient documentation

## 2023-05-17 DIAGNOSIS — F419 Anxiety disorder, unspecified: Secondary | ICD-10-CM | POA: Diagnosis present

## 2023-05-17 DIAGNOSIS — R0902 Hypoxemia: Secondary | ICD-10-CM | POA: Diagnosis not present

## 2023-05-17 DIAGNOSIS — Z91048 Other nonmedicinal substance allergy status: Secondary | ICD-10-CM

## 2023-05-17 DIAGNOSIS — R0789 Other chest pain: Secondary | ICD-10-CM | POA: Diagnosis not present

## 2023-05-17 DIAGNOSIS — I2489 Other forms of acute ischemic heart disease: Secondary | ICD-10-CM | POA: Diagnosis present

## 2023-05-17 DIAGNOSIS — Z95820 Peripheral vascular angioplasty status with implants and grafts: Secondary | ICD-10-CM

## 2023-05-17 DIAGNOSIS — R296 Repeated falls: Secondary | ICD-10-CM | POA: Diagnosis present

## 2023-05-17 DIAGNOSIS — Z5329 Procedure and treatment not carried out because of patient's decision for other reasons: Secondary | ICD-10-CM | POA: Diagnosis present

## 2023-05-17 DIAGNOSIS — Z7901 Long term (current) use of anticoagulants: Secondary | ICD-10-CM

## 2023-05-17 DIAGNOSIS — Z79890 Hormone replacement therapy (postmenopausal): Secondary | ICD-10-CM

## 2023-05-17 DIAGNOSIS — R040 Epistaxis: Secondary | ICD-10-CM | POA: Diagnosis not present

## 2023-05-17 DIAGNOSIS — L97501 Non-pressure chronic ulcer of other part of unspecified foot limited to breakdown of skin: Secondary | ICD-10-CM

## 2023-05-17 DIAGNOSIS — I6523 Occlusion and stenosis of bilateral carotid arteries: Secondary | ICD-10-CM | POA: Diagnosis not present

## 2023-05-17 DIAGNOSIS — Z823 Family history of stroke: Secondary | ICD-10-CM

## 2023-05-17 DIAGNOSIS — Z888 Allergy status to other drugs, medicaments and biological substances status: Secondary | ICD-10-CM

## 2023-05-17 HISTORY — DX: Other specified postprocedural states: Z98.890

## 2023-05-17 HISTORY — DX: Nausea with vomiting, unspecified: R11.2

## 2023-05-17 LAB — TROPONIN I (HIGH SENSITIVITY)
Troponin I (High Sensitivity): 141 ng/L (ref <18)
Troponin I (High Sensitivity): 141 ng/L (ref <18)

## 2023-05-17 LAB — PROTIME-INR
INR: 2.1 — ABNORMAL HIGH (ref 0.8–1.2)
Prothrombin Time: 23.8 s — ABNORMAL HIGH (ref 11.4–15.2)

## 2023-05-17 LAB — CBC WITH DIFFERENTIAL/PLATELET
Abs Immature Granulocytes: 0.04 10*3/uL (ref 0.00–0.07)
Basophils Absolute: 0 10*3/uL (ref 0.0–0.1)
Basophils Relative: 1 %
Eosinophils Absolute: 0.2 10*3/uL (ref 0.0–0.5)
Eosinophils Relative: 2 %
HCT: 29.6 % — ABNORMAL LOW (ref 36.0–46.0)
Hemoglobin: 9.2 g/dL — ABNORMAL LOW (ref 12.0–15.0)
Immature Granulocytes: 1 %
Lymphocytes Relative: 22 %
Lymphs Abs: 1.9 10*3/uL (ref 0.7–4.0)
MCH: 28 pg (ref 26.0–34.0)
MCHC: 31.1 g/dL (ref 30.0–36.0)
MCV: 90.2 fL (ref 80.0–100.0)
Monocytes Absolute: 0.7 10*3/uL (ref 0.1–1.0)
Monocytes Relative: 8 %
Neutro Abs: 5.7 10*3/uL (ref 1.7–7.7)
Neutrophils Relative %: 66 %
Platelets: 595 10*3/uL — ABNORMAL HIGH (ref 150–400)
RBC: 3.28 MIL/uL — ABNORMAL LOW (ref 3.87–5.11)
RDW: 16.6 % — ABNORMAL HIGH (ref 11.5–15.5)
WBC: 8.5 10*3/uL (ref 4.0–10.5)
nRBC: 0 % (ref 0.0–0.2)

## 2023-05-17 LAB — CBG MONITORING, ED
Glucose-Capillary: 61 mg/dL — ABNORMAL LOW (ref 70–99)
Glucose-Capillary: 115 mg/dL — ABNORMAL HIGH (ref 70–99)
Glucose-Capillary: 57 mg/dL — ABNORMAL LOW (ref 70–99)
Glucose-Capillary: 170 mg/dL — ABNORMAL HIGH (ref 70–99)
Glucose-Capillary: 324 mg/dL — ABNORMAL HIGH (ref 70–99)
Glucose-Capillary: 336 mg/dL — ABNORMAL HIGH (ref 70–99)

## 2023-05-17 LAB — BASIC METABOLIC PANEL
Anion gap: 10 (ref 5–15)
BUN: 34 mg/dL — ABNORMAL HIGH (ref 6–20)
CO2: 26 mmol/L (ref 22–32)
Calcium: 8.4 mg/dL — ABNORMAL LOW (ref 8.9–10.3)
Chloride: 100 mmol/L (ref 98–111)
Creatinine, Ser: 2.7 mg/dL — ABNORMAL HIGH (ref 0.44–1.00)
GFR, Estimated: 22 mL/min — ABNORMAL LOW (ref >60)
Glucose, Bld: 50 mg/dL — ABNORMAL LOW (ref 70–99)
Potassium: 3 mmol/L — ABNORMAL LOW (ref 3.5–5.1)
Sodium: 136 mmol/L (ref 135–145)

## 2023-05-17 LAB — RAPID URINE DRUG SCREEN, HOSP PERFORMED
Amphetamines: NOT DETECTED
Barbiturates: NOT DETECTED
Benzodiazepines: NOT DETECTED
Cocaine: NOT DETECTED
Opiates: NOT DETECTED
Tetrahydrocannabinol: NOT DETECTED

## 2023-05-17 LAB — ETHANOL: Alcohol, Ethyl (B): <10 mg/dL (ref <10)

## 2023-05-17 MED ORDER — ONDANSETRON HCL 4 MG/2ML IJ SOLN
4.0000 mg | Freq: Once | INTRAMUSCULAR | Status: AC
  Administered 2023-05-17: 4 mg via INTRAVENOUS
  Filled 2023-05-17: qty 2

## 2023-05-17 MED ORDER — DEXTROSE 50 % IV SOLN: 25.0000 mL | Freq: Once | INTRAVENOUS | Status: AC

## 2023-05-17 MED ORDER — INSULIN ASPART 100 UNIT/ML IJ SOLN
10.0000 [IU] | Freq: Once | INTRAMUSCULAR | Status: AC
  Administered 2023-05-17: 10 [IU] via SUBCUTANEOUS
  Filled 2023-05-17: qty 1

## 2023-05-17 MED ORDER — DEXTROSE 50 % IV SOLN
INTRAVENOUS | Status: AC
  Administered 2023-05-17: 25 mL via INTRAVENOUS
  Filled 2023-05-17: qty 50

## 2023-05-17 MED ORDER — SODIUM CHLORIDE 0.9 % IV SOLN: INTRAVENOUS | Status: DC

## 2023-05-17 MED ORDER — ZINC OXIDE 40 % EX OINT
TOPICAL_OINTMENT | Freq: Once | CUTANEOUS | Status: AC
  Filled 2023-05-17: qty 57

## 2023-05-17 MED ORDER — MORPHINE SULFATE (PF) 2 MG/ML IV SOLN
2.0000 mg | Freq: Once | INTRAVENOUS | Status: AC
  Administered 2023-05-17: 2 mg via INTRAVENOUS
  Filled 2023-05-17: qty 1

## 2023-05-17 NOTE — ED Notes (Signed)
 This RN made PA Idol aware that the pts Blood sugar is 324.

## 2023-05-17 NOTE — ED Notes (Signed)
 This RN walked into pts room to find the patient had taken all monitoring devices off after being educated that she needs the to be on.

## 2023-05-17 NOTE — ED Notes (Signed)
 Took pt to the restroom and Pt started to act confused. RN and PA notified. Sugar was checked 170. Found insulin  pump in the bed, pt states she did not take it off. Pt also took off 12 lead and stickers to the monitor. Placed on Bp and pulse ox. RN notified of all these findings.

## 2023-05-17 NOTE — ED Notes (Signed)
 Hypoglycemic Event  CBG: 57  Treatment: D50 25 mL (12.5 gm)  Symptoms:  Slight confusion, not as bad as earlier with first hypoglycemic event  Follow-up CBG: Time:  1823 CBG Result:115  Possible Reasons for Event: Unknown  Comments/MD notified:Order given for 1/2 amp of D50.

## 2023-05-17 NOTE — H&P (Addendum)
 History and Physical    Patient: Lauren [name] FMW:[id number] DOB: 01/20/1981 DOA: 05/17/2023 DOS: the patient was seen and examined on 05/18/2023 PCP: Melvenia Manus BRAVO, MD  Patient coming from: Home  Chief Complaint:  Chief Complaint  Patient presents with   Chest Pain   HPI: Lauren [name] is a 43 y.o. female with medical history significant of type 1 DM (uses insulin  pump), seizures, CVA on coumadin , CKD stage 4. Known CAD with NSTEMI in July 2024. CKD with baseline creatinine 0.9-2.3 who presents to the emergency department due to midsternal, nonradiating, reproducible, dull chest pain which was rated as 6-7/10 on pain scale.  Patient states that she took home pain meds and states she has a history of CHF, she wanted to be sure that she was not having another episode of congestive heart failure, so she came to the ED for further evaluation and management.  Chest pain already resolved by the time she got to the ED (chest pain lasted about 45 minutes prior to resolving per patient).  Patient also complained of no urination within the last 3 days. Patient was recently seen in the ED for left proximal humerus fx s/p fall 04/25/23.  She had recent admits 04/21/23- 04/24/23 for DKA and UTI and 03/2023 with seizure, DKA, and AKI.   ED Course:  In the emergency department, patient was hemodynamically stable, though BP was soft at 98/66 on arrival to the ED.  Workup in the ED showed normocytic anemia, thrombocytosis.  BMP was only positive for potassium 3.0, blood glucose 50, BUN/creatinine 30/2.70 (baseline creatinine at 1.9-2.3), eGFR 22, troponin x 2 was flat at 141.  Blood glucose was 61 on arrival to the ED. CT head without contrast showed no acute intracranial abnormality Right foot x-ray showed no acute osseous injury of the right foot Chest x-ray showed no active disease. Patient was treated with IV morphine  2 mg x 1, Zofran  was given, D50 25 mL was given due to hypoglycemia.  Insulin  was  provided. IV hydration was started. Hospitalist was asked admit patient for further evaluation and management.   Review of Systems: Review of systems as noted in the HPI. All other systems reviewed and are negative.   Past Medical History:  Diagnosis Date   Acute ischemic stroke (HCC) 01/01/2020   Acute kidney injury superimposed on CKD (HCC) 01/01/2020   Acute right MCA stroke (HCC) 01/28/2022   Anemia    CAD (coronary artery disease)    a. s/p cath in 03/2014 showing 30% mid-LAD, moderate to severe disease along small D1, patent LCx, moderate to severe distal OM2 stenosis and moderate diffuse diease along RCA not amenable to PCI   Cerebrovascular accident (CVA) (HCC) 12/31/2019   CHF (congestive heart failure) (HCC)    a. EF 55-60% in 12/2019 b. EF at 35-40% by echo in 05/2020   CKD (chronic kidney disease) stage 4, GFR 15-29 ml/min (HCC)    Diabetes mellitus without complication (HCC)    Myocardial infarction (HCC)    Neuropathy    PVD (peripheral vascular disease) (HCC)    Stroke (HCC) 01/2022   L hand weakness   Past Surgical History:  Procedure Laterality Date   ABDOMINAL AORTOGRAM W/LOWER EXTREMITY N/A 06/10/2022   Procedure: ABDOMINAL AORTOGRAM W/LOWER EXTREMITY;  Surgeon: Gretta Lonni PARAS, MD;  Location: [facility];  Service: Cardiovascular;  Laterality: N/A;   AMPUTATION Left 09/02/2021   Procedure: AMPUTATION RAY;  Surgeon: Silva Juliene SAUNDERS, DPM;  Location: [facility] OR;  Service:  Podiatry;  Laterality: Left;  sagittal saw, 3L bag saline & Pulse   Cardiac catherization     CHOLECYSTECTOMY     I & D EXTREMITY Left 08/30/2021   Procedure: IRRIGATION AND DEBRIDEMENT WITH BONE BIOPSY;  Surgeon: Tobie Franky SQUIBB, DPM;  Location: [facility] OR;  Service: Podiatry;  Laterality: Left;   IR ANGIO VERTEBRAL SEL SUBCLAVIAN INNOMINATE UNI L MOD SED  01/18/2022   IR CT HEAD LTD  01/08/2022   IR INTRA CRAN STENT  01/08/2022   IR PERCUTANEOUS ART THROMBECTOMY/INFUSION INTRACRANIAL INC DIAG ANGIO   01/08/2022   IRRIGATION AND DEBRIDEMENT FOOT Left 09/04/2021   Procedure: IRRIGATION AND DEBRIDEMENT FOOT AND CLOSURE;  Surgeon: Silva Juliene SAUNDERS, DPM;  Location: [facility] OR;  Service: Podiatry;  Laterality: Left;   LEFT HEART CATH AND CORONARY ANGIOGRAPHY N/A 07/26/2022   Procedure: LEFT HEART CATH AND CORONARY ANGIOGRAPHY;  Surgeon: [name], Peter M, MD;  Location: [facility] INV[facility] CV LAB;  Service: Cardiovascular;  Laterality: N/A;   METATARSAL OSTEOTOMY Left 07/21/2022   Procedure: LEFT FOOT EXCISION OF FIFTH METATARSAL WITH DEBRIDEMENT OF ULCER, POSSIBLE BONE BIOPSY OF THE FOURTH METATARSAL, POSSIBLE FOURTH RAY AMPUTATION OF LEFT TOEAND METATARSAL;  Surgeon: Gershon Donnice SAUNDERS, DPM;  Location: [facility] OR;  Service: Podiatry;  Laterality: Left;   PERIPHERAL VASCULAR BALLOON ANGIOPLASTY Left 06/10/2022   Procedure: PERIPHERAL VASCULAR BALLOON ANGIOPLASTY;  Surgeon: Gretta Lonni PARAS, MD;  Location: [facility];  Service: Cardiovascular;  Laterality: Left;  AT and DP   PERIPHERAL VASCULAR INTERVENTION Left 06/10/2022   Procedure: PERIPHERAL VASCULAR INTERVENTION;  Surgeon: Gretta Lonni PARAS, MD;  Location: [facility] INVASIVE CV LAB;  Service: Cardiovascular;  Laterality: Left;  SFA   RADIOLOGY WITH ANESTHESIA N/A 01/08/2022   Procedure: IR WITH ANESTHESIA;  Surgeon: Radiologist, Medication, MD;  Location: [facility] OR;  Service: Radiology;  Laterality: N/A;   THROMBECTOMY BRACHIAL ARTERY Right 11/18/2021   Procedure: RIGHT BRACHIAL, RADIAL, & ULNAR ARTERY THROMBECTOMY.;  Surgeon: Gretta Lonni PARAS, MD;  Location: [facility] OR;  Service: Vascular;  Laterality: Right;   TUBAL LIGATION      Social History:  reports that she quit smoking about 16 months ago. Her smoking use included cigarettes. She started smoking about 16 years ago. She has a 15 pack-year smoking history. She has been exposed to tobacco smoke. She has never used smokeless tobacco. She reports current alcohol  use. She reports that she does not use  drugs.   Allergies  Allergen Reactions   Visipaque  [Iodixanol ] Nausea And Vomiting    Patient had vagal response during procedure became hypertensive, and bradycardic. CO2 used during procedure prior to contrast being used, this may be cause as well. Recommended premedicating prior to contrast being used in the future.    Wellbutrin [Bupropion] Hives   Gabapentin      dizzy   Cefepime  Rash    Tolerates penicilllin   Ciprofloxacin  Hcl Hives and Rash    Hives/rash at injection site; 01/15/22 tolerated IV cipro    Tape Rash    Family History  Problem Relation Age of Onset   Thyroid  disease Mother    Hypertension Mother    Hyperlipidemia Mother    CAD Mother    CVA Mother    Hyperlipidemia Father    Hypertension Father    CAD Father    Breast cancer Paternal Grandmother 63   Cancer Paternal Grandmother        lung     Prior to Admission medications   Medication Sig Start Date End  Date Taking? Authorizing Provider  acetaminophen  (TYLENOL ) 325 MG tablet Take 1-2 tablets (325-650 mg total) by mouth every 4 (four) hours as needed for mild pain. Patient taking differently: Take 650 mg by mouth as needed for mild pain (pain score 1-3). 02/18/22  Yes Love, Sharlet RAMAN, PA-C  atorvastatin  (LIPITOR) 40 MG tablet TAKE 1 TABLET BY MOUTH ONCE DAILY. 01/25/23  Yes Melvenia Manus BRAVO, MD  clonazePAM  (KLONOPIN ) 1 MG disintegrating tablet Take 1 tablet (1 mg total) by mouth 2 (two) times daily as needed for seizure (For seizure lasting more than 2 minutes.). 05/16/23  Yes Melvenia Manus BRAVO, MD  clopidogrel  (PLAVIX ) 75 MG tablet Take 1 tablet (75 mg total) by mouth daily. 05/16/23  Yes Melvenia Manus BRAVO, MD  furosemide  (LASIX ) 40 MG tablet TAKE 1 TABLET BY MOUTH ONCE DAILY AS NEEDED FOR SWELLING *PATIENT NEEDS APPOINTMENT* 05/13/23  Yes Branch, Dorn FALCON, MD  hydrALAZINE  (APRESOLINE ) 25 MG tablet Take 1 tablet (25 mg total) by mouth in the morning and at bedtime. 04/24/23  Yes Ricky Fines, MD  insulin   aspart (NOVOLOG ) 100 UNIT/ML injection Use with Omnipod for daily dose around 100 units daily Patient taking differently: Inject 0-200 Units into the skin See admin instructions. Load Omnipod with 200 units every 3-4 days. 10/18/22  Yes Reardon, Benton PARAS, NP  isosorbide  mononitrate (IMDUR ) 60 MG 24 hr tablet TAKE TWO (2) TABLETS BY MOUTH EVERY DAY *NEW PRESCRIPTION REQUEST* 05/12/23  Yes Melvenia Manus BRAVO, MD  levothyroxine  (SYNTHROID ) 125 MCG tablet Take 1 tablet (125 mcg total) by mouth daily at 6 (six) AM. 06/17/22 06/12/23 Yes Therisa Benton PARAS, NP  metoprolol  succinate (TOPROL -XL) 50 MG 24 hr tablet Take 1 tablet (50 mg total) by mouth daily. Take with or immediately following a meal. 05/03/23 08/01/23 Yes Laurence Locus, DO  nitroGLYCERIN  (NITROSTAT ) 0.4 MG SL tablet Place 1 tablet (0.4 mg total) under the tongue every 5 (five) minutes as needed for chest pain. 05/16/23  Yes Melvenia Manus BRAVO, MD  oxyCODONE -acetaminophen  (PERCOCET) 10-325 MG tablet Take 1 tablet by mouth every 4 (four) hours as needed for pain. 05/12/23  Yes Margrette Taft BRAVO, MD  pantoprazole  (PROTONIX ) 40 MG tablet Take 1 tablet (40 mg total) by mouth daily. Courtesy fill/ pt to get established with a primary MD for further refills Patient taking differently: Take 40 mg by mouth daily as needed (heartburn). 05/16/23 11/12/23 Yes Melvenia Manus BRAVO, MD  ranolazine  (RANEXA ) 1000 MG SR tablet TAKE ONE (1) TABLET BY MOUTH TWICE DAILY *NEW PRESCRIPTION REQUEST* 05/12/23  Yes Melvenia Manus BRAVO, MD  senna-docusate (SENOKOT-S) 8.6-50 MG tablet Take 1-2 tablets by mouth daily as needed for mild constipation. 05/16/23  Yes Melvenia Manus BRAVO, MD  sodium bicarbonate  650 MG tablet Take 1 tablet (650 mg total) by mouth 2 (two) times daily. 05/16/23 06/15/23 Yes Melvenia Manus BRAVO, MD  tiZANidine  (ZANAFLEX ) 4 MG tablet TAKE 2 TABLETS BY MOUTH ONCE DAILY AT BEDTIME 05/16/23  Yes Melvenia Manus BRAVO, MD  topiramate  (TOPAMAX ) 50 MG tablet Take 1 tablet (50 mg total) by mouth  2 (two) times daily. 03/16/23  Yes Emokpae, Courage, MD  traZODone  (DESYREL ) 100 MG tablet TAKE 1 TABLET BY MOUTH AT BEDTIME *NEW PRESCRIPTION REQUEST* 05/12/23  Yes Melvenia Manus BRAVO, MD  warfarin (COUMADIN ) 5 MG tablet TAKE ONE TABLET BY MOUTH DAILY EXCEPT 1/2 TABLET ON WEDNESDAYS AND SATURDAYS OR AS DIRECTED Patient taking differently: Take 2.5-5 mg by mouth daily. Take 5mg  (1 tablet) daily, except on Tuesday, Thursday,  and Saturday's take 2.5mg  (1/2 tablet). 02/14/23  Yes Branch, Dorn FALCON, MD  Continuous Glucose Sensor (DEXCOM G6 SENSOR) MISC CHANGE SENSOR EVERY 10 DAYS. 05/16/23   Therisa Benton PARAS, NP  Continuous Glucose Transmitter (DEXCOM G6 TRANSMITTER) MISC Change transmitter every 90 days as directed. 01/20/23   Nida, Gebreselassie W, MD  Insulin  Disposable Pump (OMNIPOD 5 DEXG7G6 PODS GEN 5) MISC Change pod every 48-72 hours. 11/11/22   Therisa Benton PARAS, NP    Physical Exam: BP 135/81   Pulse (!) 103   Temp 97.8 F (36.6 C) (Oral)   Resp 13   Ht 5' 5 (1.651 m)   Wt 100 kg   LMP 05/05/2023 (Exact Date)   SpO2 98%   BMI 36.69 kg/m   General: 43 y.o. year-old female well developed well nourished in no acute distress.  Alert and oriented x3. HEENT: NCAT, EOMI, dry mucous membrane Neck: Supple, trachea medial Cardiovascular: Regular rate and rhythm with no rubs or gallops.  No thyromegaly or JVD noted. +2 bilateral lower extremity edema. 2/4 pulses in all 4 extremities. Respiratory: Clear to auscultation with no wheezes or rales. Good inspiratory effort. Abdomen: Soft, nontender nondistended with normal bowel sounds x4 quadrants. Muskuloskeletal: No erythema or warmth noted in lower extremities bilaterally.  No cyanosis, clubbing noted bilaterally Neuro: CN II-XII intact, strength 5/5 x 4, sensation, reflexes intact Skin: No ulcerative lesions noted or rashes Psychiatry: Mood is appropriate for condition and setting          Labs on Admission:  Basic Metabolic  Panel: Recent Labs  Lab 05/17/23 1745  NA 136  K 3.0*  CL 100  CO2 26  GLUCOSE 50*  BUN 34*  CREATININE 2.70*  CALCIUM  8.4*   Liver Function Tests: No results for input(s): AST, ALT, ALKPHOS, BILITOT, PROT, ALBUMIN  in the last 168 hours. No results for input(s): LIPASE, AMYLASE in the last 168 hours. No results for input(s): AMMONIA in the last 168 hours. CBC: Recent Labs  Lab 05/17/23 1745  WBC 8.5  NEUTROABS 5.7  HGB 9.2*  HCT 29.6*  MCV 90.2  PLT 595*   Cardiac Enzymes: No results for input(s): CKTOTAL, CKMB, CKMBINDEX, TROPONINI in the last 168 hours.  BNP (last 3 results) Recent Labs    07/27/22 0548 08/02/22 2023 11/07/22 1129  BNP 2,632.3* 869.3* 1,973.2*    ProBNP (last 3 results) No results for input(s): PROBNP in the last 8760 hours.  CBG: Recent Labs  Lab 05/17/23 1956 05/17/23 2245 05/17/23 2324 05/18/23 0006 05/18/23 0102  GLUCAP 170* 324* 336* 312* 244*    Radiological Exams on Admission: CT Head Wo Contrast Result Date: 05/17/2023 CLINICAL DATA:  Altered mental status EXAM: CT HEAD WITHOUT CONTRAST TECHNIQUE: Contiguous axial images were obtained from the base of the skull through the vertex without intravenous contrast. RADIATION DOSE REDUCTION: This exam was performed according to the departmental dose-optimization program which includes automated exposure control, adjustment of the mA and/or kV according to patient size and/or use of iterative reconstruction technique. COMPARISON:  04/21/2023 FINDINGS: Brain: Posterior right hemisphere encephalomalacia. Ex vacuo dilatation of the right lateral ventricle. No mass, hemorrhage or extra-axial collection. 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 Bilateral carotid atherosclerotic calcification. Skull: The visualized skull base, calvarium and extracranial soft tissues are normal. Sinuses/Orbits: No fluid levels or advanced mucosal thickening of the  visualized paranasal sinuses. No mastoid or middle ear effusion. Normal orbits. Other: None. IMPRESSION: 1. No acute intracranial abnormality. 2. Posterior right  hemisphere encephalomalacia. 3. 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 Electronically Signed   By: Franky Stanford M.D.   On: 05/17/2023 21:04   DG Foot Complete Right Result Date: 05/17/2023 CLINICAL DATA:  Redness and swelling in foot. EXAM: RIGHT FOOT COMPLETE - 3+ VIEW COMPARISON:  02/12/2022. FINDINGS: No acute fracture or dislocation. No aggressive osseous lesion. There is old healed injury of the head of the second and third metatarsal. Ankle mortise appears intact. No focal soft tissue swelling. No radiopaque foreign bodies. IMPRESSION: No acute osseous injury of the right foot. Electronically Signed   By: Ree Molt M.D.   On: 05/17/2023 19:13   DG Chest Port 1 View Result Date: 05/17/2023 CLINICAL DATA:  Chest pain EXAM: PORTABLE CHEST 1 VIEW COMPARISON:  04/29/2023 FINDINGS: Low lung volumes. Mild cardiomegaly. No acute airspace disease, pleural effusion, or pneumothorax. Healing left proximal humerus fracture IMPRESSION: No active disease. Low lung volumes. Mild cardiomegaly. Electronically Signed   By: Luke Bun M.D.   On: 05/17/2023 19:10    EKG: I independently viewed the EKG done and my findings are as followed: Normal sinus rhythm at a rate of 95 bpm  Assessment/Plan Present on Admission:  Acute kidney injury superimposed on stage 4 chronic kidney disease (HCC)  Atypical chest pain  Elevated troponin  Hypokalemia  Obesity, Class II, BMI 35-39.9  Essential hypertension  Acquired hypothyroidism  GERD (gastroesophageal reflux disease)  Principal Problem:   Acute kidney injury superimposed on stage 4 chronic kidney disease (HCC) Active Problems:   Essential hypertension   Obesity, Class II, BMI 35-39.9   Acquired hypothyroidism   Hypokalemia   Atypical chest pain   Elevated troponin   GERD  (gastroesophageal reflux disease)   Dehydration   Type 2 diabetes mellitus with hypoglycemia (HCC)   Thrombocytosis   Closed fracture of left proximal humerus  Acute kidney injury on CKD 4 Dehydration BUN/creatinine 30/2.70 (baseline creatinine at 1.9-2.3) Continue gentle hydration Renally adjust medications, avoid nephrotoxic agents/dehydration/hypotension  Type 2 diabetes mellitus with hypoglycemia Hemoglobin A1c in December 2024 was 8.7 Patient's insulin  pump came off and is not functional at this time IV D50 was provided due to blood glucose of 50 Continue Accu-Cheks, ISS and hypoglycemia protocol  Atypical chest pain-resolved  Elevated troponin Troponin was flat at 140 x 2 No EKG changes noted Continue to monitor patient  Hypokalemia K+ 3.0, this will be replenished  Obesity class II (BMI 36.69) Diet and lifestyle modification  Thrombocytosis possibly reactive Platelets 595, continue to monitor platelet levels  Left proximal humerus fracture s/p fall  Left shoulder in sling Continue oxycodone  as needed Continue fall precaution Continue PT/OT eval and treat Subendocardial MI  Chronic combined systolic and diastolic CHF Echocardiogram done on 05/02/2023 showed LVEF of 30 to 35%. Continue home Coreg   Subendocardial MI Continue Coreg , statin, Plavix   Essential hypertension Continue home Coreg   Acquired hypothyroidism Continue Synthroid   GERD Continue Protonix    DVT prophylaxis: Warfarin  Code Status: DNR  Family Communication: None at bedside  Consults: None  Severity of Illness: The appropriate patient status for this patient is INPATIENT. Inpatient status is judged to be reasonable and necessary in order to provide the required intensity of service to ensure the patient's safety. The patient's presenting symptoms, physical exam findings, and initial radiographic and laboratory data in the context of their chronic comorbidities is felt to place  them at high risk for further clinical deterioration. Furthermore, it is not anticipated that the patient  will be medically stable for discharge from the hospital within 2 midnights of admission.   * I certify that at the point of admission it is my clinical judgment that the patient will require inpatient hospital care spanning beyond 2 midnights from the point of admission due to high intensity of service, high risk for further deterioration and high frequency of surveillance required.*  Author: Shequita Peplinski, DO 05/18/2023 1:35 AM  For on call review [email or web address].

## 2023-05-17 NOTE — ED Notes (Signed)
 Pt c/o central pain that started today, bilateral lower extremity swelling x 1 week, and not urinating x 4 days. She reports she has stage IV kidney disease but still normally urinates 3 times a day. Pt also concerned she might have an infection on her right foot because she has an area that is draining yellow colored fluid between her first and second digits.

## 2023-05-17 NOTE — ED Notes (Signed)
 Hypoglycemic Event  CBG: 61   Treatment: 4 oz juice/soda  Symptoms:  Confusion  Follow-up CBG: Time:1753 CBG Result:57  Possible Reasons for Event: Unknown  Comments/MD notified:Julie Idol, PA notified. Order given to administer 1/2 amp of D50 after repeat CBG.

## 2023-05-17 NOTE — ED Provider Notes (Signed)
 [place] EMERGENCY DEPARTMENT AT [facility] Provider Note   CSN: [id number] Arrival date & time: 05/17/23  1640     History  Chief Complaint  Patient presents with   Chest Pain    Lauren [name] is a 43 y.o. 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  It was not associated with nausea or vomiting, diaphoresis, but states her symptoms were similar to her prior MI during her admission in December (also was in dka).    Additionally she has complaints of bilateral lower extremity edema and has not urinated in the last 3 to 4 days, raising concern about worsening kidney function.  She has a wound between her right great and second toes which she says has been weeping for several days and now there is redness and tenderness on her dorsal foot.  She denies fevers or chills.  Upon arrival she was found to be hypoglycemic at 61, she was given juice to drink and recheck it was lower at 57, she was given one half amp D50 through her IV.  Of note, her insulin  pump has dislodged and is not currently running.  During ed stay,  pt has been alternating between periods of alertness, followed by confusion and bizarre behavior.  Was in the bathroom and became agitated over concern the floor was going to fall out from under her.  She was helped back to her room and within 5 minutes was calm and lucid again.  Of note, also has left distal humeral fracture, supposed to be in sling and swath, but not present with her.     The history is provided by the patient.       Home Medications Prior to Admission medications   Medication Sig Start  Date End Date Taking? Authorizing Provider  acetaminophen  (TYLENOL ) 325 MG tablet Take 1-2 tablets (325-650 mg total) by mouth every 4 (four) hours as needed for mild pain. Patient taking differently: Take 650 mg by mouth as needed for mild pain (pain score 1-3). 02/18/22  Yes Love, Sharlet RAMAN, PA-C  atorvastatin  (LIPITOR) 40 MG tablet TAKE 1 TABLET BY MOUTH ONCE DAILY. 01/25/23  Yes Melvenia Manus BRAVO, MD  clonazePAM  (KLONOPIN ) 1 MG disintegrating tablet Take 1 tablet (1 mg total) by mouth 2 (two) times daily as needed for seizure (For seizure lasting more than 2 minutes.). 05/16/23  Yes Melvenia Manus BRAVO, MD  clopidogrel  (PLAVIX ) 75 MG tablet Take 1 tablet (75 mg total) by mouth daily. 05/16/23  Yes Melvenia Manus BRAVO, MD  furosemide  (LASIX ) 40 MG tablet TAKE 1 TABLET BY MOUTH ONCE DAILY AS NEEDED FOR SWELLING *PATIENT NEEDS APPOINTMENT* 05/13/23  Yes Branch, Dorn FALCON, MD  hydrALAZINE  (APRESOLINE ) 25 MG tablet Take 1 tablet (25 mg total) by mouth in the morning and at bedtime. 04/24/23  Yes Ricky Fines, MD  insulin  aspart (NOVOLOG ) 100 UNIT/ML injection Use with Omnipod for daily dose around 100 units daily Patient taking differently: Inject 0-200 Units into the skin See admin instructions. Load Omnipod with 200 units every 3-4 days. 10/18/22  Yes Therisa Benton PARAS, NP  isosorbide  mononitrate (IMDUR ) 60 MG 24 hr tablet TAKE  TWO (2) TABLETS BY MOUTH EVERY DAY *NEW PRESCRIPTION REQUEST* 05/12/23  Yes Melvenia Manus BRAVO, MD  levothyroxine  (SYNTHROID ) 125 MCG tablet Take 1 tablet (125 mcg total) by mouth daily at 6 (six) AM. 06/17/22 06/12/23 Yes Therisa Benton PARAS, NP  metoprolol  succinate (TOPROL -XL) 50 MG 24 hr tablet Take 1 tablet (50 mg total) by mouth daily. Take with or immediately following a meal. 05/03/23 08/01/23 Yes Laurence Locus, DO  nitroGLYCERIN  (NITROSTAT ) 0.4 MG SL tablet Place 1 tablet (0.4 mg total) under the tongue every 5 (five) minutes as needed for chest pain. 05/16/23  Yes Melvenia Manus BRAVO, MD   oxyCODONE -acetaminophen  (PERCOCET) 10-325 MG tablet Take 1 tablet by mouth every 4 (four) hours as needed for pain. 05/12/23  Yes Margrette Taft BRAVO, MD  pantoprazole  (PROTONIX ) 40 MG tablet Take 1 tablet (40 mg total) by mouth daily. Courtesy fill/ pt to get established with a primary MD for further refills Patient taking differently: Take 40 mg by mouth daily as needed (heartburn). 05/16/23 11/12/23 Yes Melvenia Manus BRAVO, MD  ranolazine  (RANEXA ) 1000 MG SR tablet TAKE ONE (1) TABLET BY MOUTH TWICE DAILY *NEW PRESCRIPTION REQUEST* 05/12/23  Yes Melvenia Manus BRAVO, MD  senna-docusate (SENOKOT-S) 8.6-50 MG tablet Take 1-2 tablets by mouth daily as needed for mild constipation. 05/16/23  Yes Melvenia Manus BRAVO, MD  sodium bicarbonate  650 MG tablet Take 1 tablet (650 mg total) by mouth 2 (two) times daily. 05/16/23 06/15/23 Yes Melvenia Manus BRAVO, MD  tiZANidine  (ZANAFLEX ) 4 MG tablet TAKE 2 TABLETS BY MOUTH ONCE DAILY AT BEDTIME 05/16/23  Yes Melvenia Manus BRAVO, MD  topiramate  (TOPAMAX ) 50 MG tablet Take 1 tablet (50 mg total) by mouth 2 (two) times daily. 03/16/23  Yes Emokpae, Courage, MD  traZODone  (DESYREL ) 100 MG tablet TAKE 1 TABLET BY MOUTH AT BEDTIME *NEW PRESCRIPTION REQUEST* 05/12/23  Yes Melvenia Manus BRAVO, MD  warfarin (COUMADIN ) 5 MG tablet TAKE ONE TABLET BY MOUTH DAILY EXCEPT 1/2 TABLET ON WEDNESDAYS AND SATURDAYS OR AS DIRECTED Patient taking differently: Take 2.5-5 mg by mouth daily. Take 5mg  (1 tablet) daily, except on Tuesday, Thursday, and Saturday's take 2.5mg  (1/2 tablet). 02/14/23  Yes Branch, Dorn FALCON, MD  Continuous Glucose Sensor (DEXCOM G6 SENSOR) MISC CHANGE SENSOR EVERY 10 DAYS. 05/16/23   Therisa Benton PARAS, NP  Continuous Glucose Transmitter (DEXCOM G6 TRANSMITTER) MISC Change transmitter every 90 days as directed. 01/20/23   Nida, Gebreselassie W, MD  Insulin  Disposable Pump (OMNIPOD 5 DEXG7G6 PODS GEN 5) MISC Change pod every 48-72 hours. 11/11/22   Therisa Benton PARAS, NP      Allergies     Visipaque  [iodixanol ], Wellbutrin [bupropion], Gabapentin , Cefepime , Ciprofloxacin  hcl, and Tape    Review of Systems   Review of Systems  Cardiovascular:  Positive for chest pain and leg swelling.  Skin:  Positive for color change and wound.  Psychiatric/Behavioral:  Positive for confusion.     Physical Exam Updated Vital Signs BP 115/72   Pulse (!) 101   Temp 97.8 F (36.6 C) (Oral)   Resp 16   Ht 5' 5 (1.651 m)   Wt 100 kg   LMP 05/05/2023 (Exact Date)   SpO2 100%   BMI 36.69 kg/m  Physical Exam Vitals and nursing note reviewed.  Constitutional:      General: She is not in acute distress.    Appearance: She is well-developed.     Comments: Periods of lucidity followed by confusion.  HENT:     Head: Normocephalic  and atraumatic.  Eyes:     Conjunctiva/sclera: Conjunctivae normal.  Cardiovascular:     Rate and Rhythm: Normal rate and regular rhythm.     Heart sounds: Normal heart sounds.  Pulmonary:     Effort: Pulmonary effort is normal.     Breath sounds: Normal breath sounds. No wheezing or rhonchi.  Abdominal:     General: Bowel sounds are normal.     Palpations: Abdomen is soft.     Tenderness: There is no abdominal tenderness. There is no guarding.  Musculoskeletal:        General: Normal range of motion.     Cervical back: Normal range of motion.     Comments: Erythema of right dorsal foot and lower leg.  Warm to touch, there is a shallow appearing moist abrasion between her great and second toes on this foot.  She has bilateral foot and ankle nonpitting edema.  Skin:    General: Skin is warm and dry.     Findings: Erythema present.     Comments: Several old bruising on arms.   Neurological:     Mental Status: She is confused.     Cranial Nerves: No cranial nerve deficit.     Sensory: Sensation is intact.     ED Results / Procedures / Treatments   Labs (all labs ordered are listed, but only abnormal results are displayed) Labs Reviewed  BASIC  METABOLIC PANEL - Abnormal; Notable for the following components:      Result Value   Potassium 3.0 (*)    Glucose, Bld 50 (*)    BUN 34 (*)    Creatinine, Ser 2.70 (*)    Calcium  8.4 (*)    GFR, Estimated 22 (*)    All other components within normal limits  CBC WITH DIFFERENTIAL/PLATELET - Abnormal; Notable for the following components:   RBC 3.28 (*)    Hemoglobin 9.2 (*)    HCT 29.6 (*)    RDW 16.6 (*)    Platelets 595 (*)    All other components within normal limits  PROTIME-INR - Abnormal; Notable for the following components:   Prothrombin  Time 23.8 (*)    INR 2.1 (*)    All other components within normal limits  CBG MONITORING, ED - Abnormal; Notable for the following components:   Glucose-Capillary 61 (*)    All other components within normal limits  CBG MONITORING, ED - Abnormal; Notable for the following components:   Glucose-Capillary 57 (*)    All other components within normal limits  CBG MONITORING, ED - Abnormal; Notable for the following components:   Glucose-Capillary 115 (*)    All other components within normal limits  CBG MONITORING, ED - Abnormal; Notable for the following components:   Glucose-Capillary 170 (*)    All other components within normal limits  CBG MONITORING, ED - Abnormal; Notable for the following components:   Glucose-Capillary 324 (*)    All other components within normal limits  TROPONIN I (HIGH SENSITIVITY) - Abnormal; Notable for the following components:   Troponin I (High Sensitivity) 141 (*)    All other components within normal limits  TROPONIN I (HIGH SENSITIVITY) - Abnormal; Notable for the following components:   Troponin I (High Sensitivity) 141 (*)    All other components within normal limits  ETHANOL  RAPID URINE DRUG SCREEN, HOSP PERFORMED    EKG EKG Interpretation Date/Time:  Tuesday May 17 2023 17:09:56 EST Ventricular Rate:  95 PR Interval:  144  QRS Duration:  108 QT Interval:  373 QTC Calculation: 469 R  Axis:   85  Text Interpretation: Sinus rhythm Ventricular premature complex Consider right atrial enlargement Probable inferior infarct, old Minimal ST elevation, anterior leads Lateral leads are also involved no significant change since Dec 2024 Confirmed by Freddi Hamilton [phone]) on 05/17/2023 5:44:22 PM  Radiology CT Head Wo Contrast Result Date: 05/17/2023 CLINICAL DATA:  Altered mental status EXAM: CT HEAD WITHOUT CONTRAST TECHNIQUE: Contiguous axial images were obtained from the base of the skull through the vertex without intravenous contrast. RADIATION DOSE REDUCTION: This exam was performed according to the departmental dose-optimization program which includes automated exposure control, adjustment of the mA and/or kV according to patient size and/or use of iterative reconstruction technique. COMPARISON:  04/21/2023 FINDINGS: Brain: Posterior right hemisphere encephalomalacia. Ex vacuo dilatation of the right lateral ventricle. No mass, hemorrhage or extra-axial collection. 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 Bilateral carotid atherosclerotic calcification. Skull: The visualized skull base, calvarium and extracranial soft tissues are normal. Sinuses/Orbits: No fluid levels or advanced mucosal thickening of the visualized paranasal sinuses. No mastoid or middle ear effusion. Normal orbits. Other: None. IMPRESSION: 1. No acute intracranial abnormality. 2. Posterior right hemisphere encephalomalacia. 3. 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 Electronically Signed   By: Franky Stanford M.D.   On: 05/17/2023 21:04   DG Foot Complete Right Result Date: 05/17/2023 CLINICAL DATA:  Redness and swelling in foot. EXAM: RIGHT FOOT COMPLETE - 3+ VIEW COMPARISON:  02/12/2022. FINDINGS: No acute fracture or dislocation. No aggressive osseous lesion. There is old healed injury of the head of the second and third metatarsal. Ankle mortise appears intact. No focal soft tissue  swelling. No radiopaque foreign bodies. IMPRESSION: No acute osseous injury of the right foot. Electronically Signed   By: Ree Molt M.D.   On: 05/17/2023 19:13   DG Chest Port 1 View Result Date: 05/17/2023 CLINICAL DATA:  Chest pain EXAM: PORTABLE CHEST 1 VIEW COMPARISON:  04/29/2023 FINDINGS: Low lung volumes. Mild cardiomegaly. No acute airspace disease, pleural effusion, or pneumothorax. Healing left proximal humerus fracture IMPRESSION: No active disease. Low lung volumes. Mild cardiomegaly. Electronically Signed   By: Luke Bun M.D.   On: 05/17/2023 19:10    Procedures Procedures    Medications Ordered in ED Medications  0.9 %  sodium chloride  infusion ( Intravenous New Bag/Given 05/17/23 2258)  insulin  aspart (novoLOG ) injection 10 Units (has no administration in time range)  dextrose  50 % solution 25 mL (25 mLs Intravenous Given 05/17/23 1800)  ondansetron  (ZOFRAN ) injection 4 mg (4 mg Intravenous Given 05/17/23 2110)  morphine  (PF) 2 MG/ML injection 2 mg (2 mg Intravenous Given 05/17/23 2114)  liver oil-zinc  oxide (DESITIN) 40 % ointment ( Topical Given 05/17/23 2258)    ED Course/ Medical Decision Making/ A&P                                 Medical Decision Making Pt presenting with multiple complaints, initially presenting with a 1 hour history of chest pain which resolved upon arrival, although she has elevated troponins, her delta troponin is negative, suspect this elevation is declining from her recent hospitalization for DKA and MI.  She was initially hypoglycemic here, shortly after arrival she pulled out her insulin  pump, close watch of her CBGs revealing slowly increasing blood glucose levels, IV fluids and fast acting insulin  ordered,  she will need sliding scale insulin .  Her exam does suggest a right lower extremity cellulitis, IV antibiotics have been started.  She has a metabolic encephalopathy of unclear etiology.  She presented with confusion at her last  hospitalization, however at that time she was also in DKA this  does not appear to be the case today.  Amount and/or Complexity of Data Reviewed Labs: ordered.    Details: Patient's delta troponins are flat at 141, suspect this is a declining troponin from her recent hospitalization, her UDS is negative, she is therapeutic with her Coumadin  with an INR of 2.1, her BNP with initial glucose of 50, this has steadily increased after receiving D50.  She does appear dry with a BUN of 34 and a worsening creatinine of 2.70.  She has a normal WBC count at 8.5, her hemoglobin is 9.2, this is a normocytic anemia and is chronic. Radiology: ordered.    Details: CT head is negative for intracranial hemorrhage.  Right foot reviewed, no subcutaneous gas or obvious deep infection/osteomyelitis based on plain film imaging.  Chest x-ray is negative for active disease. ECG/medicine tests: ordered.    Details: Reviewed, rate 95, PVC, old inferior infarct noted, no acute findings. Discussion of management or test interpretation with external provider(s): Patient discussed with Dr. Adefeso who accepts patient for admission  Risk Decision regarding hospitalization.           Final Clinical Impression(s) / ED Diagnoses Final diagnoses:  Acute kidney injury [facility])  Metabolic encephalopathy  Chest pain, unspecified type  Cellulitis of right lower extremity    Rx / DC Orders ED Discharge Orders          Ordered    Sling        05/17/23 2230              Birdena Clarity, PA-C 05/17/23 2333    Freddi Hamilton, MD 05/20/23 [phone]

## 2023-05-17 NOTE — ED Triage Notes (Signed)
 Pt c/o central chest pain that started today.

## 2023-05-17 NOTE — ED Notes (Addendum)
 Walked into pts room to find that pt has removed cardiac monitoring devices. Pt was educated for the second time on the importance of leaving the monitoring devices on. Pt was placed back on the cardiac monitoring devices.

## 2023-05-18 ENCOUNTER — Inpatient Hospital Stay (HOSPITAL_COMMUNITY): Payer: 59

## 2023-05-18 ENCOUNTER — Ambulatory Visit: Payer: 59

## 2023-05-18 ENCOUNTER — Encounter (HOSPITAL_COMMUNITY): Payer: Self-pay | Admitting: Internal Medicine

## 2023-05-18 DIAGNOSIS — D75839 Thrombocytosis, unspecified: Secondary | ICD-10-CM | POA: Insufficient documentation

## 2023-05-18 DIAGNOSIS — N184 Chronic kidney disease, stage 4 (severe): Secondary | ICD-10-CM | POA: Diagnosis not present

## 2023-05-18 DIAGNOSIS — N179 Acute kidney failure, unspecified: Secondary | ICD-10-CM | POA: Diagnosis not present

## 2023-05-18 DIAGNOSIS — E11649 Type 2 diabetes mellitus with hypoglycemia without coma: Secondary | ICD-10-CM | POA: Insufficient documentation

## 2023-05-18 DIAGNOSIS — E860 Dehydration: Secondary | ICD-10-CM | POA: Insufficient documentation

## 2023-05-18 DIAGNOSIS — S42202A Unspecified fracture of upper end of left humerus, initial encounter for closed fracture: Secondary | ICD-10-CM | POA: Insufficient documentation

## 2023-05-18 LAB — CBC
HCT: 23.6 % — ABNORMAL LOW (ref 36.0–46.0)
HCT: 26.7 % — ABNORMAL LOW (ref 36.0–46.0)
Hemoglobin: 7.3 g/dL — ABNORMAL LOW (ref 12.0–15.0)
Hemoglobin: 8.1 g/dL — ABNORMAL LOW (ref 12.0–15.0)
MCH: 27.9 pg (ref 26.0–34.0)
MCH: 28 pg (ref 26.0–34.0)
MCHC: 30.9 g/dL (ref 30.0–36.0)
MCHC: 30.3 g/dL (ref 30.0–36.0)
MCV: 90.1 fL (ref 80.0–100.0)
MCV: 92.4 fL (ref 80.0–100.0)
Platelets: 453 10*3/uL — ABNORMAL HIGH (ref 150–400)
Platelets: 489 10*3/uL — ABNORMAL HIGH (ref 150–400)
RBC: 2.62 MIL/uL — ABNORMAL LOW (ref 3.87–5.11)
RBC: 2.89 MIL/uL — ABNORMAL LOW (ref 3.87–5.11)
RDW: 16.7 % — ABNORMAL HIGH (ref 11.5–15.5)
RDW: 16.9 % — ABNORMAL HIGH (ref 11.5–15.5)
WBC: 8.2 10*3/uL (ref 4.0–10.5)
WBC: 7 10*3/uL (ref 4.0–10.5)
nRBC: 0 % (ref 0.0–0.2)
nRBC: 0 % (ref 0.0–0.2)

## 2023-05-18 LAB — URINALYSIS, COMPLETE (UACMP) WITH MICROSCOPIC
Bilirubin Urine: NEGATIVE
Glucose, UA: 50 mg/dL — AB
Ketones, ur: NEGATIVE mg/dL
Leukocytes,Ua: NEGATIVE
Nitrite: NEGATIVE
Protein, ur: 100 mg/dL — AB
Specific Gravity, Urine: 1.012 (ref 1.005–1.030)
pH: 6 (ref 5.0–8.0)

## 2023-05-18 LAB — COMPREHENSIVE METABOLIC PANEL
ALT: 16 U/L (ref 0–44)
AST: 15 U/L (ref 15–41)
Albumin: 2.1 g/dL — ABNORMAL LOW (ref 3.5–5.0)
Alkaline Phosphatase: 131 U/L — ABNORMAL HIGH (ref 38–126)
Anion gap: 5 (ref 5–15)
BUN: 33 mg/dL — ABNORMAL HIGH (ref 6–20)
CO2: 25 mmol/L (ref 22–32)
Calcium: 7.8 mg/dL — ABNORMAL LOW (ref 8.9–10.3)
Chloride: 105 mmol/L (ref 98–111)
Creatinine, Ser: 2.5 mg/dL — ABNORMAL HIGH (ref 0.44–1.00)
GFR, Estimated: 24 mL/min — ABNORMAL LOW (ref >60)
Glucose, Bld: 152 mg/dL — ABNORMAL HIGH (ref 70–99)
Potassium: 3.8 mmol/L (ref 3.5–5.1)
Sodium: 135 mmol/L (ref 135–145)
Total Bilirubin: 0.6 mg/dL (ref 0.0–1.2)
Total Protein: 4.9 g/dL — ABNORMAL LOW (ref 6.5–8.1)

## 2023-05-18 LAB — GLUCOSE, CAPILLARY
Glucose-Capillary: 101 mg/dL — ABNORMAL HIGH (ref 70–99)
Glucose-Capillary: 60 mg/dL — ABNORMAL LOW (ref 70–99)
Glucose-Capillary: 83 mg/dL (ref 70–99)
Glucose-Capillary: 258 mg/dL — ABNORMAL HIGH (ref 70–99)
Glucose-Capillary: 334 mg/dL — ABNORMAL HIGH (ref 70–99)
Glucose-Capillary: 376 mg/dL — ABNORMAL HIGH (ref 70–99)
Glucose-Capillary: 312 mg/dL — ABNORMAL HIGH (ref 70–99)

## 2023-05-18 LAB — PROTIME-INR
INR: 1.9 — ABNORMAL HIGH (ref 0.8–1.2)
Prothrombin Time: 21.8 s — ABNORMAL HIGH (ref 11.4–15.2)

## 2023-05-18 LAB — CBG MONITORING, ED
Glucose-Capillary: 147 mg/dL — ABNORMAL HIGH (ref 70–99)
Glucose-Capillary: 244 mg/dL — ABNORMAL HIGH (ref 70–99)
Glucose-Capillary: 312 mg/dL — ABNORMAL HIGH (ref 70–99)

## 2023-05-18 MED ORDER — LEVOTHYROXINE SODIUM 25 MCG PO TABS
125.0000 ug | ORAL_TABLET | Freq: Every day | ORAL | Status: DC
  Administered 2023-05-19 – 2023-05-28 (×10): 125 ug via ORAL
  Filled 2023-05-18 (×11): qty 1

## 2023-05-18 MED ORDER — ACETAMINOPHEN 325 MG PO TABS
650.0000 mg | ORAL_TABLET | Freq: Four times a day (QID) | ORAL | Status: DC | PRN
Start: 2023-05-18 — End: 2023-05-28
  Administered 2023-05-21 – 2023-05-25 (×3): 650 mg via ORAL
  Filled 2023-05-18 (×3): qty 2

## 2023-05-18 MED ORDER — WARFARIN - PHYSICIAN DOSING INPATIENT
Freq: Every day | Status: DC
Start: 2023-05-18 — End: 2023-05-18

## 2023-05-18 MED ORDER — INSULIN ASPART 100 UNIT/ML IJ SOLN
0.0000 [IU] | INTRAMUSCULAR | Status: DC
  Administered 2023-05-18: 1 [IU] via SUBCUTANEOUS
  Administered 2023-05-18: 5 [IU] via SUBCUTANEOUS
  Filled 2023-05-18: qty 1

## 2023-05-18 MED ORDER — WARFARIN SODIUM 5 MG PO TABS
5.0000 mg | ORAL_TABLET | ORAL | Status: DC
  Administered 2023-05-18 – 2023-05-22 (×3): 5 mg via ORAL
  Filled 2023-05-18 (×3): qty 1

## 2023-05-18 MED ORDER — DEXTROSE 50 % IV SOLN
INTRAVENOUS | Status: AC
  Filled 2023-05-18: qty 50

## 2023-05-18 MED ORDER — VANCOMYCIN HCL 1250 MG/250ML IV SOLN: 1250.0000 mg | INTRAVENOUS | Status: DC

## 2023-05-18 MED ORDER — ACETAMINOPHEN 650 MG RE SUPP: 650.0000 mg | Freq: Four times a day (QID) | RECTAL | Status: DC | PRN

## 2023-05-18 MED ORDER — SODIUM CHLORIDE 0.9 % IV SOLN: INTRAVENOUS | Status: DC

## 2023-05-18 MED ORDER — PANTOPRAZOLE SODIUM 40 MG PO TBEC
40.0000 mg | DELAYED_RELEASE_TABLET | Freq: Every day | ORAL | Status: DC
  Administered 2023-05-18 – 2023-05-28 (×10): 40 mg via ORAL
  Filled 2023-05-18 (×10): qty 1

## 2023-05-18 MED ORDER — LACTATED RINGERS IV SOLN: INTRAVENOUS | Status: DC

## 2023-05-18 MED ORDER — INSULIN ASPART 100 UNIT/ML IJ SOLN
0.0000 [IU] | Freq: Three times a day (TID) | INTRAMUSCULAR | Status: DC
Start: 2023-05-18 — End: 2023-05-28
  Administered 2023-05-18: 9 [IU] via SUBCUTANEOUS
  Administered 2023-05-19: 3 [IU] via SUBCUTANEOUS
  Administered 2023-05-19: 2 [IU] via SUBCUTANEOUS
  Administered 2023-05-20: 7 [IU] via SUBCUTANEOUS
  Administered 2023-05-20: 2 [IU] via SUBCUTANEOUS
  Administered 2023-05-21: 1 [IU] via SUBCUTANEOUS
  Administered 2023-05-21: 3 [IU] via SUBCUTANEOUS
  Administered 2023-05-22: 7 [IU] via SUBCUTANEOUS
  Administered 2023-05-22: 9 [IU] via SUBCUTANEOUS
  Administered 2023-05-23: 7 [IU] via SUBCUTANEOUS
  Administered 2023-05-23: 3 [IU] via SUBCUTANEOUS
  Administered 2023-05-24 (×2): 2 [IU] via SUBCUTANEOUS
  Administered 2023-05-25: 3 [IU] via SUBCUTANEOUS
  Administered 2023-05-26: 9 [IU] via SUBCUTANEOUS
  Administered 2023-05-26 – 2023-05-27 (×2): 3 [IU] via SUBCUTANEOUS
  Administered 2023-05-27: 7 [IU] via SUBCUTANEOUS
  Administered 2023-05-27: 1 [IU] via SUBCUTANEOUS

## 2023-05-18 MED ORDER — CLOPIDOGREL BISULFATE 75 MG PO TABS
75.0000 mg | ORAL_TABLET | Freq: Every day | ORAL | Status: DC
  Administered 2023-05-18 – 2023-05-28 (×8): 75 mg via ORAL
  Filled 2023-05-18 (×10): qty 1

## 2023-05-18 MED ORDER — DEXTROSE 50 % IV SOLN: 12.5000 g | INTRAVENOUS | Status: DC

## 2023-05-18 MED ORDER — GADOBUTROL 1 MMOL/ML IV SOLN
9.0000 mL | Freq: Once | INTRAVENOUS | Status: AC | PRN
  Administered 2023-05-18: 9 mL via INTRAVENOUS

## 2023-05-18 MED ORDER — ATORVASTATIN CALCIUM 40 MG PO TABS
40.0000 mg | ORAL_TABLET | Freq: Every day | ORAL | Status: DC
  Administered 2023-05-18 – 2023-05-28 (×10): 40 mg via ORAL
  Filled 2023-05-18 (×10): qty 1

## 2023-05-18 MED ORDER — ENOXAPARIN SODIUM 40 MG/0.4ML IJ SOSY: 40.0000 mg | PREFILLED_SYRINGE | INTRAMUSCULAR | Status: DC

## 2023-05-18 MED ORDER — WARFARIN SODIUM 2.5 MG PO TABS
2.5000 mg | ORAL_TABLET | Freq: Every day | ORAL | Status: DC
Start: 2023-05-18 — End: 2023-05-18

## 2023-05-18 MED ORDER — PROCHLORPERAZINE EDISYLATE 10 MG/2ML IJ SOLN: 10.0000 mg | Freq: Four times a day (QID) | INTRAMUSCULAR | Status: DC | PRN

## 2023-05-18 MED ORDER — OXYCODONE-ACETAMINOPHEN 5-325 MG PO TABS
1.0000 | ORAL_TABLET | ORAL | Status: DC | PRN
  Administered 2023-05-18 – 2023-05-27 (×18): 1 via ORAL
  Filled 2023-05-18 (×19): qty 1

## 2023-05-18 MED ORDER — VANCOMYCIN HCL 2000 MG/400ML IV SOLN
2000.0000 mg | Freq: Once | INTRAVENOUS | Status: AC
Start: 2023-05-18 — End: 2023-05-18
  Administered 2023-05-18: 2000 mg via INTRAVENOUS
  Filled 2023-05-18: qty 400

## 2023-05-18 MED ORDER — INSULIN GLARGINE-YFGN 100 UNIT/ML ~~LOC~~ SOLN
15.0000 [IU] | Freq: Every day | SUBCUTANEOUS | Status: DC
  Administered 2023-05-18 – 2023-05-22 (×5): 15 [IU] via SUBCUTANEOUS
  Filled 2023-05-18 (×7): qty 0.15

## 2023-05-18 MED ORDER — POTASSIUM CHLORIDE CRYS ER 20 MEQ PO TBCR
40.0000 meq | EXTENDED_RELEASE_TABLET | Freq: Once | ORAL | Status: AC
  Administered 2023-05-18: 40 meq via ORAL
  Filled 2023-05-18: qty 2

## 2023-05-18 MED ORDER — WARFARIN SODIUM 2.5 MG PO TABS
2.5000 mg | ORAL_TABLET | ORAL | Status: DC
Start: 2023-05-19 — End: 2023-05-23
  Administered 2023-05-19 – 2023-05-21 (×2): 2.5 mg via ORAL
  Filled 2023-05-18 (×2): qty 1

## 2023-05-18 MED ORDER — INSULIN ASPART 100 UNIT/ML IJ SOLN
3.0000 [IU] | Freq: Three times a day (TID) | INTRAMUSCULAR | Status: DC
Start: 2023-05-18 — End: 2023-05-21
  Administered 2023-05-18 – 2023-05-21 (×6): 3 [IU] via SUBCUTANEOUS

## 2023-05-18 MED ORDER — SODIUM CHLORIDE 0.9 % IV SOLN
2.0000 g | Freq: Two times a day (BID) | INTRAVENOUS | Status: DC
  Administered 2023-05-18 – 2023-05-19 (×2): 2 g via INTRAVENOUS
  Filled 2023-05-18 (×3): qty 12.5

## 2023-05-18 NOTE — Progress Notes (Addendum)
Upon this RN's shift assessment pt is confused. She is able to tell this RN her first name and that she is at the hospital. She exhibits bizarre behavior and states "I'm frozen" when asked what her age is. She states randomly "please, get out there". Denies pain but is grimacing. CBG is 312. Consulting civil engineer notified.  [place] RN

## 2023-05-18 NOTE — Progress Notes (Signed)
 Patient tearful at times throughout shift, she request to remove her gown , so this Clinical research associate did , patient able to walk with assistance to the restroom without difficulty , Feeds self with tray set up help only.  Emotional support offered to patient

## 2023-05-18 NOTE — Progress Notes (Signed)
 Pharmacy Antibiotic Note  Lauren [name] is a 43 y.o. female admitted on 05/17/2023 with cellulitis.  Pharmacy has been consulted for vancomycin  and cefepime  dosing. Cefepime  allergy with rash listed but has tolerated multiple times including in 2024.  Plan: Vancomycin  2000 mg IV x 1 dose. Vancomycin  1250 mg IV every 48 hours. Cefepime  2000 mg IV every 12 hours. Monitor labs, c/s, and vanco level as indicated.  Height: 5\' 5"  (165.1 cm) Weight: 94 kg (207 lb 3.7 oz) IBW/kg (Calculated) : 57  Temp (24hrs), Avg:98.2 F (36.8 C), Min:97.8 F (36.6 C), Max:98.9 F (37.2 C)  Recent Labs  Lab 05/17/23 1745 05/18/23 0426  WBC 8.5 8.2  CREATININE 2.70* 2.50*    Estimated Creatinine Clearance: 33.9 mL/min (A) (by C-G formula based on SCr of 2.5 mg/dL (H)).    Allergies  Allergen Reactions   Visipaque  [Iodixanol ] Nausea And Vomiting    Patient had vagal response during procedure became hypertensive, and bradycardic. CO2 used during procedure prior to contrast being used, this may be cause as well. Recommended premedicating prior to contrast being used in the future.    Wellbutrin [Bupropion] Hives   Gabapentin      dizzy   Cefepime  Rash    Tolerates penicilllin   Ciprofloxacin  Hcl Hives and Rash    Hives/rash at injection site; 01/15/22 tolerated IV cipro    Tape Rash    Antimicrobials this admission: Vanco 1/15 >> Cefepime  1/15 >>  Microbiology results: None pending  Thank you for allowing pharmacy to be a part of this patient's care.  Cliffton Dama, PharmD Clinical Pharmacist 05/18/2023 1:35 PM

## 2023-05-18 NOTE — Consult Note (Signed)
 [facility] Liaison Consult Note   05/18/2023  Lauren [name] Dec 08, 1980 [id number]  Insurance: Raina Bunting    Primary Care Provider: Tobi Fortes, MD, [facility], this provider is listed for the transition of care follow up appointments  and [facility] calls   [facility] Liaison screened the patient remotely at [facility].    The patient was screened for 30 day readmission hospitalization with noted extreme risk score for unplanned readmission risk 3 ED visits and 4 hospital admissions in 6 months.  The patient was assessed for potential Community Care Coordination service needs for post hospital transition for care coordination. Review of patient's electronic medical record reveals patient has been out reached by [facility] and Child psychotherapist and recently declined services offered through community care coordination..   Plan: [facility] Liaison will continue to follow progress and disposition to asess for post hospital community care coordination/management needs.  Referral request for community care coordination: Anticipate out reach for post hospital follow up and assess community needs to be offered.   [facility] Care, Population Health does not replace or interfere with any arrangements made by the Inpatient Transition of Care team.   For questions contact:   [place], RN, BSN, CCM [place]  [facility], [facility] Liaison Direct Dial: [phone] or secure chat Email: [email or web address]

## 2023-05-18 NOTE — ED Notes (Signed)
 ED TO INPATIENT HANDOFF REPORT  ED Nurse Name and Phone #:  Viola Greulich RN [phone]  S Name/Age/Gender Lauren [name] 43 y.o. female Room/Bed: APA19/APA19  Code Status   Code Status: Limited: Do not attempt resuscitation (DNR) -DNR-LIMITED -Do Not Intubate/DNI   Home/SNF/Other Home Patient oriented to: self Is this baseline? No   Triage Complete: Triage complete  Chief Complaint Acute metabolic encephalopathy [G93.41]  Triage Note Pt c/o central chest pain that started today.    Allergies Allergies  Allergen Reactions   Visipaque  [Iodixanol ] Nausea And Vomiting    Patient had vagal response during procedure became hypertensive, and bradycardic. CO2 used during procedure prior to contrast being used, this may be cause as well. Recommended premedicating prior to contrast being used in the future.    Wellbutrin [Bupropion] Hives   Gabapentin      dizzy   Cefepime  Rash    Tolerates penicilllin   Ciprofloxacin  Hcl Hives and Rash    Hives/rash at injection site; 01/15/22 tolerated IV cipro    Tape Rash    Level of Care/Admitting Diagnosis ED Disposition     ED Disposition  Admit   Condition  --   Comment  Hospital Area: [facility] [[id number]]  Level of Care: Telemetry [5]  Covid Evaluation: Asymptomatic - no recent exposure (last 10 days) testing not required  Diagnosis: Acute metabolic encephalopathy [2956213]  Admitting Physician: ADEFESO, OLADAPO [[id number]]  Attending Physician: ADEFESO, OLADAPO [[id number]]  Certification:: I certify this patient will need inpatient services for at least 2 midnights  Expected Medical Readiness: 05/20/2023          B Medical/Surgery History Past Medical History:  Diagnosis Date   Acute ischemic stroke (HCC) 01/01/2020   Acute kidney injury superimposed on CKD (HCC) 01/01/2020   Acute right MCA stroke (HCC) 01/28/2022   Anemia    CAD (coronary artery disease)    a. s/p cath in 03/2014 showing 30% mid-LAD,  moderate to severe disease along small D1, patent LCx, moderate to severe distal OM2 stenosis and moderate diffuse diease along RCA not amenable to PCI   Cerebrovascular accident (CVA) (HCC) 12/31/2019   CHF (congestive heart failure) (HCC)    a. EF 55-60% in 12/2019 b. EF at 35-40% by echo in 05/2020   CKD (chronic kidney disease) stage 4, GFR 15-29 ml/min (HCC)    Diabetes mellitus without complication (HCC)    Myocardial infarction (HCC)    Neuropathy    PVD (peripheral vascular disease) (HCC)    Stroke (HCC) 01/2022   L hand weakness   Past Surgical History:  Procedure Laterality Date   ABDOMINAL AORTOGRAM W/LOWER EXTREMITY N/A 06/10/2022   Procedure: ABDOMINAL AORTOGRAM W/LOWER EXTREMITY;  Surgeon: Young Hensen, MD;  Location: [facility] INVASIVE CV LAB;  Service: Cardiovascular;  Laterality: N/A;   AMPUTATION Left 09/02/2021   Procedure: AMPUTATION RAY;  Surgeon: Floyce Hutching, DPM;  Location: [facility] OR;  Service: Podiatry;  Laterality: Left;  sagittal saw, 3L bag saline & Pulse   Cardiac catherization     CHOLECYSTECTOMY     I & D EXTREMITY Left 08/30/2021   Procedure: IRRIGATION AND DEBRIDEMENT WITH BONE BIOPSY;  Surgeon: Velma Ghazi, DPM;  Location: [facility] OR;  Service: Podiatry;  Laterality: Left;   IR ANGIO VERTEBRAL SEL SUBCLAVIAN INNOMINATE UNI L MOD SED  01/18/2022   IR CT HEAD LTD  01/08/2022   IR INTRA CRAN STENT  01/08/2022   IR PERCUTANEOUS ART THROMBECTOMY/INFUSION INTRACRANIAL INC DIAG ANGIO  01/08/2022  IRRIGATION AND DEBRIDEMENT FOOT Left 09/04/2021   Procedure: IRRIGATION AND DEBRIDEMENT FOOT AND CLOSURE;  Surgeon: Floyce Hutching, DPM;  Location: [facility] OR;  Service: Podiatry;  Laterality: Left;   LEFT HEART CATH AND CORONARY ANGIOGRAPHY N/A 07/26/2022   Procedure: LEFT HEART CATH AND CORONARY ANGIOGRAPHY;  Surgeon: Swazil[place], Peter M, MD;  Location: [facility] INVASIVE CV LAB;  Service: Cardiovascular;  Laterality: N/A;   METATARSAL OSTEOTOMY Left 07/21/2022   Procedure: LEFT FOOT EXCISION  OF FIFTH METATARSAL WITH DEBRIDEMENT OF ULCER, POSSIBLE BONE BIOPSY OF THE FOURTH METATARSAL, POSSIBLE FOURTH RAY AMPUTATION OF LEFT TOEAND METATARSAL;  Surgeon: Charity Conch, DPM;  Location: [facility] OR;  Service: Podiatry;  Laterality: Left;   PERIPHERAL VASCULAR BALLOON ANGIOPLASTY Left 06/10/2022   Procedure: PERIPHERAL VASCULAR BALLOON ANGIOPLASTY;  Surgeon: Young Hensen, MD;  [facility]: [facility];  Service: Cardiovascular;  Laterality: Left;  AT and DP   PERIPHERAL VASCULAR INTERVENTION Left 06/10/2022   Procedure: PERIPHERAL VASCULAR INTERVENTION;  Surgeon: Young Hensen, MD;  Location: [facility] INVASIVE CV LAB;  Service: Cardiovascular;  Laterality: Left;  SFA   RADIOLOGY WITH ANESTHESIA N/A 01/08/2022   Procedure: IR WITH ANESTHESIA;  Surgeon: Radiologist, Medication, MD;  Location: [facility] OR;  Service: Radiology;  Laterality: N/A;   THROMBECTOMY BRACHIAL ARTERY Right 11/18/2021   Procedure: RIGHT BRACHIAL, RADIAL, & ULNAR ARTERY THROMBECTOMY.;  Surgeon: Young Hensen, MD;  Location: [facility] OR;  Service: Vascular;  Laterality: Right;   TUBAL LIGATION       A IV Location/Drains/Wounds Patient Lines/Drains/Airways Status     Active Line/Drains/Airways     Name Placement date Placement time Site Days   Peripheral IV 05/18/23 20 G Right Antecubital 05/18/23  0442  Antecubital  less than 1   Wound / Incision (Open or Dehisced) Other (Comment) Foot Left;Posterior --  --  Foot  --            Intake/Output Last 24 hours  Intake/Output Summary (Last 24 hours) at 05/18/2023 4696 Last data filed at 05/18/2023 0013 Gross per 24 hour  Intake 154 ml  Output --  Net 154 ml    Labs/Imaging Results for orders placed or performed during the hospital encounter of 05/17/23 (from the past 48 hours)  CBG monitoring, ED     Status: Abnormal   Collection Time: 05/17/23  5:24 PM  Result Value Ref Range   Glucose-Capillary 61 (L) 70 - 99 mg/dL    Comment: Glucose reference range  applies only to samples taken after fasting for at least 8 hours.  Basic metabolic panel     Status: Abnormal   Collection Time: 05/17/23  5:45 PM  Result Value Ref Range   Sodium 136 135 - 145 mmol/L   Potassium 3.0 (L) 3.5 - 5.1 mmol/L   Chloride 100 98 - 111 mmol/L   CO2 26 22 - 32 mmol/L   Glucose, Bld 50 (L) 70 - 99 mg/dL    Comment: Glucose reference range applies only to samples taken after fasting for at least 8 hours.   BUN 34 (H) 6 - 20 mg/dL   Creatinine, Ser 2.95 (H) 0.44 - 1.00 mg/dL   Calcium  8.4 (L) 8.9 - 10.3 mg/dL   GFR, Estimated 22 (L) >60 mL/min    Comment: (NOTE) Calculated using the CKD-EPI Creatinine Equation (2021)    Anion gap 10 5 - 15    Comment: Performed at [facility], [address] [place], [place] [address]  Troponin I (High Sensitivity)  Status: Abnormal   Collection Time: 05/17/23  5:45 PM  Result Value Ref Range   Troponin I (High Sensitivity) 141 (HH) <18 ng/L    Comment: CRITICAL RESULT CALLED TO, READ BACK BY AND VERIFIED WITH K BELTON RN 1947 [phone] K FORSYTH (NOTE) Elevated high sensitivity troponin I (hsTnI) values and significant  changes across serial measurements may suggest ACS but many other  chronic and acute conditions are known to elevate hsTnI results.  Refer to the "Links" section for chest pain algorithms and additional  guidance. Performed at [facility], [address] [place], [place] [address]   CBC with Differential     Status: Abnormal   Collection Time: 05/17/23  5:45 PM  Result Value Ref Range   WBC 8.5 4.0 - 10.5 K/uL   RBC 3.28 (L) 3.87 - 5.11 MIL/uL   Hemoglobin 9.2 (L) 12.0 - 15.0 g/dL   HCT 98.1 (L) 19.1 - 47.8 %   MCV 90.2 80.0 - 100.0 fL   MCH 28.0 26.0 - 34.0 pg   MCHC 31.1 30.0 - 36.0 g/dL   RDW 29.5 (H) 62.1 - 30.8 %   Platelets 595 (H) 150 - 400 K/uL   nRBC 0.0 0.0 - 0.2 %   Neutrophils Relative % 66 %   Neutro Abs 5.7 1.7 - 7.7 K/uL   Lymphocytes Relative 22 %   Lymphs Abs 1.9 0.7 - 4.0 K/uL    Monocytes Relative 8 %   Monocytes Absolute 0.7 0.1 - 1.0 K/uL   Eosinophils Relative 2 %   Eosinophils Absolute 0.2 0.0 - 0.5 K/uL   Basophils Relative 1 %   Basophils Absolute 0.0 0.0 - 0.1 K/uL   Immature Granulocytes 1 %   Abs Immature Granulocytes 0.04 0.00 - 0.07 K/uL    Comment: Performed at [facility], [address], [place], [place] [address]  Ethanol     Status: None   Collection Time: 05/17/23  5:45 PM  Result Value Ref Range   Alcohol , Ethyl (B) <10 <10 mg/dL    Comment: (NOTE) Lowest detectable limit for serum alcohol  is 10 mg/dL.  For medical purposes only. Performed at [facility], [address] [place], [place] [address]   Protime-INR     Status: Abnormal   Collection Time: 05/17/23  5:45 PM  Result Value Ref Range   Prothrombin  Time 23.8 (H) 11.4 - 15.2 seconds   INR 2.1 (H) 0.8 - 1.2    Comment: (NOTE) INR goal varies based on device and disease states. Performed at [facility], [address], [place], [place] [address]   CBG monitoring, ED     Status: Abnormal   Collection Time: 05/17/23  5:53 PM  Result Value Ref Range   Glucose-Capillary 57 (L) 70 - 99 mg/dL    Comment: Glucose reference range applies only to samples taken after fasting for at least 8 hours.  CBG monitoring, ED     Status: Abnormal   Collection Time: 05/17/23  6:23 PM  Result Value Ref Range   Glucose-Capillary 115 (H) 70 - 99 mg/dL    Comment: Glucose reference range applies only to samples taken after fasting for at least 8 hours.  CBG monitoring, ED     Status: Abnormal   Collection Time: 05/17/23  7:56 PM  Result Value Ref Range   Glucose-Capillary 170 (H) 70 - 99 mg/dL    Comment: Glucose reference range applies only to samples taken after fasting for at least 8 hours.  Rapid  urine drug screen (hospital performed)     Status: None   Collection Time: 05/17/23  8:05 PM  Result Value Ref Range   Opiates NONE DETECTED NONE DETECTED   Cocaine NONE DETECTED NONE DETECTED    Benzodiazepines NONE DETECTED NONE DETECTED   Amphetamines NONE DETECTED NONE DETECTED   Tetrahydrocannabinol NONE DETECTED NONE DETECTED   Barbiturates NONE DETECTED NONE DETECTED    Comment: (NOTE) DRUG SCREEN FOR MEDICAL PURPOSES ONLY.  IF CONFIRMATION IS NEEDED FOR ANY PURPOSE, NOTIFY LAB WITHIN 5 DAYS.  LOWEST DETECTABLE LIMITS FOR URINE DRUG SCREEN Drug Class                     Cutoff (ng/mL) Amphetamine and metabolites    1000 Barbiturate and metabolites    200 Benzodiazepine                 200 Opiates and metabolites        300 Cocaine and metabolites        300 THC                            50 Performed at [facility], [address] [place], [place] [address]   Troponin I (High Sensitivity)     Status: Abnormal   Collection Time: 05/17/23  8:42 PM  Result Value Ref Range   Troponin I (High Sensitivity) 141 (HH) <18 ng/L    Comment: CRITICAL RESULT CALLED TO, READ BACK BY AND VERIFIED WITH BELTON,C ON 05/17/23 AT 2145 BY LOY,C (NOTE) Elevated high sensitivity troponin I (hsTnI) values and significant  changes across serial measurements may suggest ACS but many other  chronic and acute conditions are known to elevate hsTnI results.  Refer to the "Links" section for chest pain algorithms and additional  guidance. Performed at [facility], [address], [place], [place] [address]   CBG monitoring, ED     Status: Abnormal   Collection Time: 05/17/23 10:45 PM  Result Value Ref Range   Glucose-Capillary 324 (H) 70 - 99 mg/dL    Comment: Glucose reference range applies only to samples taken after fasting for at least 8 hours.  CBG monitoring, ED     Status: Abnormal   Collection Time: 05/17/23 11:24 PM  Result Value Ref Range   Glucose-Capillary 336 (H) 70 - 99 mg/dL    Comment: Glucose reference range applies only to samples taken after fasting for at least 8 hours.  CBG monitoring, ED     Status: Abnormal   Collection Time: 05/18/23 12:06 AM  Result Value Ref  Range   Glucose-Capillary 312 (H) 70 - 99 mg/dL    Comment: Glucose reference range applies only to samples taken after fasting for at least 8 hours.  CBG monitoring, ED     Status: Abnormal   Collection Time: 05/18/23  1:02 AM  Result Value Ref Range   Glucose-Capillary 244 (H) 70 - 99 mg/dL    Comment: Glucose reference range applies only to samples taken after fasting for at least 8 hours.  CBG monitoring, ED     Status: Abnormal   Collection Time: 05/18/23  4:04 AM  Result Value Ref Range   Glucose-Capillary 147 (H) 70 - 99 mg/dL    Comment: Glucose reference range applies only to samples taken after fasting for at least 8 hours.  CBC     Status: Abnormal   Collection Time: 05/18/23  4:26 AM  Result Value Ref Range   WBC 8.2 4.0 - 10.5 K/uL   RBC 2.62 (L) 3.87 - 5.11 MIL/uL   Hemoglobin 7.3 (L) 12.0 - 15.0 g/dL   HCT 16.1 (L) 09.6 - 04.5 %   MCV 90.1 80.0 - 100.0 fL   MCH 27.9 26.0 - 34.0 pg   MCHC 30.9 30.0 - 36.0 g/dL   RDW 40.9 (H) 81.1 - 91.4 %   Platelets 453 (H) 150 - 400 K/uL   nRBC 0.0 0.0 - 0.2 %    Comment: Performed at [facility], [address] [place], [place] [address]  Comprehensive metabolic panel     Status: Abnormal   Collection Time: 05/18/23  4:26 AM  Result Value Ref Range   Sodium 135 135 - 145 mmol/L   Potassium 3.8 3.5 - 5.1 mmol/L   Chloride 105 98 - 111 mmol/L   CO2 25 22 - 32 mmol/L   Glucose, Bld 152 (H) 70 - 99 mg/dL    Comment: Glucose reference range applies only to samples taken after fasting for at least 8 hours.   BUN 33 (H) 6 - 20 mg/dL   Creatinine, Ser 6.21 (H) 0.44 - 1.00 mg/dL   Calcium  7.8 (L) 8.9 - 10.3 mg/dL   Total Protein 4.9 (L) 6.5 - 8.1 g/dL   Albumin  2.1 (L) 3.5 - 5.0 g/dL   AST 15 15 - 41 U/L   ALT 16 0 - 44 U/L   Alkaline Phosphatase 131 (H) 38 - 126 U/L   Total Bilirubin 0.6 0.0 - 1.2 mg/dL   GFR, Estimated 24 (L) >60 mL/min    Comment: (NOTE) Calculated using the CKD-EPI Creatinine Equation (2021)    Anion gap  5 5 - 15    Comment: Performed at [facility], [address], [place], [place] [address]  Protime-INR     Status: Abnormal   Collection Time: 05/18/23  4:26 AM  Result Value Ref Range   Prothrombin  Time 21.8 (H) 11.4 - 15.2 seconds   INR 1.9 (H) 0.8 - 1.2    Comment: (NOTE) INR goal varies based on device and disease states. Performed at [facility], [address] [place], [place] [address]    CT Head Wo Contrast Result Date: 05/17/2023 CLINICAL DATA:  Altered mental status EXAM: CT HEAD WITHOUT CONTRAST TECHNIQUE: Contiguous axial images were obtained from the base of the skull through the vertex without intravenous contrast. RADIATION DOSE REDUCTION: This exam was performed according to the departmental dose-optimization program which includes automated exposure control, adjustment of the mA and/or kV according to patient size and/or use of iterative reconstruction technique. COMPARISON:  04/21/2023 FINDINGS: Brain: Posterior right hemisphere encephalomalacia. Ex vacuo dilatation of the right lateral ventricle. No mass, hemorrhage or extra-axial collection. 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 Bilateral carotid atherosclerotic calcification. Skull: The visualized skull base, calvarium and extracranial soft tissues are normal. Sinuses/Orbits: No fluid levels or advanced mucosal thickening of the visualized paranasal sinuses. No mastoid or middle ear effusion. Normal orbits. Other: None. IMPRESSION: 1. No acute intracranial abnormality. 2. Posterior right hemisphere encephalomalacia. 3. 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 Electronically Signed   By: Juanetta Nordmann M.D.   On: 05/17/2023 21:04   DG Foot Complete Right Result Date: 05/17/2023 CLINICAL DATA:  Redness and swelling in foot. EXAM: RIGHT FOOT COMPLETE - 3+ VIEW COMPARISON:  02/12/2022. FINDINGS: No acute fracture or dislocation. No aggressive osseous lesion. There is old healed  injury of the head of  the second and third metatarsal. Ankle mortise appears intact. No focal soft tissue swelling. No radiopaque foreign bodies. IMPRESSION: No acute osseous injury of the right foot. Electronically Signed   By: Beula Brunswick M.D.   On: 05/17/2023 19:13   DG Chest Port 1 View Result Date: 05/17/2023 CLINICAL DATA:  Chest pain EXAM: PORTABLE CHEST 1 VIEW COMPARISON:  04/29/2023 FINDINGS: Low lung volumes. Mild cardiomegaly. No acute airspace disease, pleural effusion, or pneumothorax. Healing left proximal humerus fracture IMPRESSION: No active disease. Low lung volumes. Mild cardiomegaly. Electronically Signed   By: Esmeralda Hedge M.D.   On: 05/17/2023 19:10    Pending Labs Unresulted Labs (From admission, onward)     Start     Ordered   05/18/23 0500  Protime-INR  Daily,   R      05/18/23 0237   05/17/23 2330  Urinalysis, Routine w reflex microscopic -Urine, Clean Catch  Add-on,   AD       Question:  Specimen Source  Answer:  Urine, Clean Catch   05/17/23 2329            Vitals/Pain Today's Vitals   05/18/23 0100 05/18/23 0200 05/18/23 0300 05/18/23 0430  BP: 138/83 132/82 123/78 97/77  Pulse: (!) 102 98 92 95  Resp: 13 13 19 13   Temp:      TempSrc:      SpO2: 98% 98% 98%   Weight:      Height:      PainSc:        Isolation Precautions No active isolations  Medications Medications  0.9 %  sodium chloride  infusion ( Intravenous New Bag/Given 05/18/23 0016)  insulin  aspart (novoLOG ) injection 0-9 Units (1 Units Subcutaneous Given 05/18/23 0438)  oxyCODONE -acetaminophen  (PERCOCET/ROXICET) 5-325 MG per tablet 1 tablet (has no administration in time range)  levothyroxine  (SYNTHROID ) tablet 125 mcg (has no administration in time range)  pantoprazole  (PROTONIX ) EC tablet 40 mg (has no administration in time range)  clopidogrel  (PLAVIX ) tablet 75 mg (has no administration in time range)  atorvastatin  (LIPITOR) tablet 40 mg (has no administration in time range)  acetaminophen   (TYLENOL ) tablet 650 mg (has no administration in time range)    Or  acetaminophen  (TYLENOL ) suppository 650 mg (has no administration in time range)  prochlorperazine  (COMPAZINE ) injection 10 mg (has no administration in time range)  Warfarin - Physician Dosing Inpatient (has no administration in time range)  warfarin (COUMADIN ) tablet 5 mg (has no administration in time range)    And  warfarin (COUMADIN ) tablet 2.5 mg (has no administration in time range)  dextrose  50 % solution 25 mL (25 mLs Intravenous Given 05/17/23 1800)  ondansetron  (ZOFRAN ) injection 4 mg (4 mg Intravenous Given 05/17/23 2110)  morphine  (PF) 2 MG/ML injection 2 mg (2 mg Intravenous Given 05/17/23 2114)  liver oil-zinc  oxide (DESITIN) 40 % ointment ( Topical Given 05/17/23 2258)  insulin  aspart (novoLOG ) injection 10 Units (10 Units Subcutaneous Given 05/17/23 2325)  potassium chloride  SA (KLOR-CON  M) CR tablet 40 mEq (40 mEq Oral Given 05/18/23 0115)    Mobility walks with person assist     Focused Assessments    R Recommendations: See Admitting Provider Note  Report given to:  Bree RN   Additional Notes:

## 2023-05-18 NOTE — Inpatient Diabetes Management (Addendum)
 Inpatient Diabetes Program Recommendations  AACE/ADA: New Consensus Statement on Inpatient Glycemic Control   Target Ranges:  Prepandial:   less than 140 mg/dL      Peak postprandial:   less than 180 mg/dL (1-2 hours)      Critically ill patients:  140 - 180 mg/dL    Latest Reference Range & Units 05/18/23 01:02 05/18/23 04:04 05/18/23 06:34 05/18/23 07:21  Glucose-Capillary 70 - 99 mg/dL 657 (H) 846 (H)  Novolog  1 unit 101 (H) 60 (L)    Latest Reference Range & Units 05/17/23 17:24 05/17/23 17:53 05/17/23 18:23 05/17/23 19:56 05/17/23 22:45 05/17/23 23:24 05/18/23 00:06  Glucose-Capillary 70 - 99 mg/dL 61 (L) 57 (L) 962 (H) 952 (H) 324 (H) 336 (H)  Novolog  10 units @23 :25 312 (H)    Review of Glycemic Control  Diabetes history: DM60 (dx at 43 years old; makes NO insulin  - requires basal, correction, and carb coverage insulin ) Outpatient Diabetes medications: OmniPod insulin  pump with Novolog ; Dexcom G6 CGM Current orders for Inpatient glycemic control: Novolog  0-9 units Q4H  Inpatient Diabetes Program Recommendations:    Insulin : If patient will not be using her insulin  pump, she will need to have basal insulin  and meal coverage insulin  ordered.  Noted CBG 60 mg/dl this morning and currently ordered Novolog  correction Q4H. CBG up to 258 mg/dl at 84:13 am. Please consider ordering Semglee  15 units Q24H and Novolog  3 units TID with meals for meal coverage if patient eats at least 50% of meals.   NOTE: Patient has hx of Type 1 DM and uses an OmniPod insulin  pump along with Dexcom G6 CGM outpatient for DM control. Per ED notes and H&P, patient's insulin  pump came off in ED last night. Patient sees Mervyn Ace, NP [facility] Endocrin[facility]) and was last seen on 10/18/22. Inpatient diabetes coordinator spoke with patient on 04/22/23 during prior admission.  Sent chat to [employer], LPN to ask if she could be sure patient did not have on her insulin  pump since CBG 60 mg/dl this morning. Will plan to  see patient today.  Addendum 05/18/23@10 :30-Spoke with patient at bedside regarding DM. Patient confirms that her OmniPod pump fell off yesterday in the ED. Patient states that she mainly uses the automode on the pump. Patient does not have any extra pump supplies at the hospital.  Patient reports that her glucose is up and down; reports she does not have hypoglycemia very often. Patient still has on Dexcom G6 CGM and current CGM glucose was 191 mg/dl. Patient has insulin  pump PDM at bedside. Insulin  delivery was still on which was stopped in order to review pump settings.  Reviewed insulin  pump settings which are as follows:  Basal 12A   0.9 units/hr Total Basal: 21.6 units/day  Insulin  to Carb Ratio 1:5 grams Insulin  Sensitivity Factor  1:38 mg/dl  Did not resume insulin  delivery on PDM since patient reports she does not have on an insulin  pump pod currently. Discussed that her glucose was noted to be 60 mg/dl this morning and she reports that she could feel that she was getting low and her Dexcom was also alarming due to hypoglycemia. Patient confirms that she sees W. Verlyn Goad, NP for DM management but she notes that due to cost of copays, she can not afford to see her very often. Patient states she just recently got approved for disability so she is hopeful that she will get better insurance coverage so it isn't as expensive to see providers and get needed insulin  pump  supplies. Patient reports that she has plenty of DM medications, insulin  pump supplies, and Dexcom G6 sensors at home. Discussed that she is only currently ordered Novolog  correction but basal and meal coverage will likely be added once glucose is no longer low. Patient verbalized understanding of information and has no questions at this time.  Thanks, Beacher Limerick, RN, MSN, CDCES Diabetes Coordinator Inpatient Diabetes Program [phone] (Team Pager from 8am to 5pm)

## 2023-05-18 NOTE — Progress Notes (Signed)
 PHARMACY - ANTICOAGULATION CONSULT NOTE  Pharmacy Consult for warfarin Indication: CVA  Allergies  Allergen Reactions   Visipaque  [Iodixanol ] Nausea And Vomiting    Patient had vagal response during procedure became hypertensive, and bradycardic. CO2 used during procedure prior to contrast being used, this may be cause as well. Recommended premedicating prior to contrast being used in the future.    Wellbutrin [Bupropion] Hives   Gabapentin      dizzy   Cefepime  Rash    Tolerates penicilllin   Ciprofloxacin  Hcl Hives and Rash    Hives/rash at injection site; 01/15/22 tolerated IV cipro    Tape Rash    Patient Measurements: Height: 5\' 5"  (165.1 cm) Weight: 94 kg (207 lb 3.7 oz) IBW/kg (Calculated) : 57 Heparin  Dosing Weight:   Vital Signs: Temp: 98.9 F (37.2 C) (01/15 0639) Temp Source: Oral (01/15 0617) BP: 102/75 (01/15 0931) Pulse Rate: 92 (01/15 0931)  Labs: Recent Labs    05/17/23 1745 05/17/23 2042 05/18/23 0426  HGB 9.2*  --  7.3*  HCT 29.6*  --  23.6*  PLT 595*  --  453*  LABPROT 23.8*  --  21.8*  INR 2.1*  --  1.9*  CREATININE 2.70*  --  2.50*  TROPONINIHS 141* 141*  --     Estimated Creatinine Clearance: 33.9 mL/min (A) (by C-G formula based on SCr of 2.5 mg/dL (H)).   Medical History: Past Medical History:  Diagnosis Date   Acute ischemic stroke (HCC) 01/01/2020   Acute kidney injury superimposed on CKD (HCC) 01/01/2020   Acute right MCA stroke (HCC) 01/28/2022   Anemia    CAD (coronary artery disease)    a. s/p cath in 03/2014 showing 30% mid-LAD, moderate to severe disease along small D1, patent LCx, moderate to severe distal OM2 stenosis and moderate diffuse diease along RCA not amenable to PCI   Cerebrovascular accident (CVA) (HCC) 12/31/2019   CHF (congestive heart failure) (HCC)    a. EF 55-60% in 12/2019 b. EF at 35-40% by echo in 05/2020   CKD (chronic kidney disease) stage 4, GFR 15-29 ml/min (HCC)    Diabetes mellitus without  complication (HCC)    Myocardial infarction (HCC)    Neuropathy    PVD (peripheral vascular disease) (HCC)    Stroke (HCC) 01/2022   L hand weakness    Medications:  Medications Prior to Admission  Medication Sig Dispense Refill Last Dose/Taking   acetaminophen  (TYLENOL ) 325 MG tablet Take 1-2 tablets (325-650 mg total) by mouth every 4 (four) hours as needed for mild pain. (Patient taking differently: Take 650 mg by mouth as needed for mild pain (pain score 1-3).) 100 tablet 0 Past Month   [Paused] atorvastatin  (LIPITOR) 40 MG tablet TAKE 1 TABLET BY MOUTH ONCE DAILY. 90 tablet 1 Past Month   clonazePAM  (KLONOPIN ) 1 MG disintegrating tablet Take 1 tablet (1 mg total) by mouth 2 (two) times daily as needed for seizure (For seizure lasting more than 2 minutes.). 60 tablet 0 Taking As Needed   clopidogrel  (PLAVIX ) 75 MG tablet Take 1 tablet (75 mg total) by mouth daily. 30 tablet 11 05/15/2023   furosemide  (LASIX ) 40 MG tablet TAKE 1 TABLET BY MOUTH ONCE DAILY AS NEEDED FOR SWELLING *PATIENT NEEDS APPOINTMENT* 15 tablet 10 Past Week   hydrALAZINE  (APRESOLINE ) 25 MG tablet Take 1 tablet (25 mg total) by mouth in the morning and at bedtime.   05/15/2023   insulin  aspart (NOVOLOG ) 100 UNIT/ML injection Use with Omnipod for daily dose  around 100 units daily (Patient taking differently: Inject 0-200 Units into the skin See admin instructions. Load Omnipod with 200 units every 3-4 days.) 80 mL 3 05/15/2023   isosorbide  mononitrate (IMDUR ) 60 MG 24 hr tablet TAKE TWO (2) TABLETS BY MOUTH EVERY DAY *NEW PRESCRIPTION REQUEST* 60 tablet 10 05/15/2023   levothyroxine  (SYNTHROID ) 125 MCG tablet Take 1 tablet (125 mcg total) by mouth daily at 6 (six) AM. 90 tablet 3 05/15/2023   metoprolol  succinate (TOPROL -XL) 50 MG 24 hr tablet Take 1 tablet (50 mg total) by mouth daily. Take with or immediately following a meal. 90 tablet 0 05/15/2023   nitroGLYCERIN  (NITROSTAT ) 0.4 MG SL tablet Place 1 tablet (0.4 mg total)  under the tongue every 5 (five) minutes as needed for chest pain. 100 tablet 3 Taking As Needed   oxyCODONE -acetaminophen  (PERCOCET) 10-325 MG tablet Take 1 tablet by mouth every 4 (four) hours as needed for pain. 30 tablet 0 Past Week   pantoprazole  (PROTONIX ) 40 MG tablet Take 1 tablet (40 mg total) by mouth daily. Courtesy fill/ pt to get established with a primary MD for further refills (Patient taking differently: Take 40 mg by mouth daily as needed (heartburn).) 90 tablet 1 Past Month   ranolazine  (RANEXA ) 1000 MG SR tablet TAKE ONE (1) TABLET BY MOUTH TWICE DAILY *NEW PRESCRIPTION REQUEST* 60 tablet 10 05/15/2023   senna-docusate (SENOKOT-S) 8.6-50 MG tablet Take 1-2 tablets by mouth daily as needed for mild constipation. 30 tablet 0 Taking As Needed   sodium bicarbonate  650 MG tablet Take 1 tablet (650 mg total) by mouth 2 (two) times daily. 60 tablet 0 05/15/2023   tiZANidine  (ZANAFLEX ) 4 MG tablet TAKE 2 TABLETS BY MOUTH ONCE DAILY AT BEDTIME 30 tablet 10 05/15/2023   topiramate  (TOPAMAX ) 50 MG tablet Take 1 tablet (50 mg total) by mouth 2 (two) times daily. 60 tablet 3 05/15/2023   traZODone  (DESYREL ) 100 MG tablet TAKE 1 TABLET BY MOUTH AT BEDTIME *NEW PRESCRIPTION REQUEST* 30 tablet 10 05/15/2023   warfarin (COUMADIN ) 5 MG tablet TAKE ONE TABLET BY MOUTH DAILY EXCEPT 1/2 TABLET ON WEDNESDAYS AND SATURDAYS OR AS DIRECTED (Patient taking differently: Take 2.5-5 mg by mouth daily. Take 5mg  (1 tablet) daily, except on Tuesday, Thursday, and Saturday's take 2.5mg  (1/2 tablet).) 40 tablet 5 05/15/2023   Continuous Glucose Sensor (DEXCOM G6 SENSOR) MISC CHANGE SENSOR EVERY 10 DAYS. 3 each 1    Continuous Glucose Transmitter (DEXCOM G6 TRANSMITTER) MISC Change transmitter every 90 days as directed. 1 each 3    Insulin  Disposable Pump (OMNIPOD 5 DEXG7G6 PODS GEN 5) MISC Change pod every 48-72 hours. 100 each 0     Assessment: Pharmacy consulted to dose warfarin in patient with history of CVA. Home dose  listed as 2.5 mg on Tue/Thu/Sat and 5 mg ROW with last dose listed as 1/12.  INR 2.1 > 1.9  Goal of Therapy:  INR 2-3 Monitor platelets by anticoagulation protocol: Yes   Plan:  Warfarin 5 mg x 1 dose. Monitor daily INR and s/s of bleeding  Cliffton Dama, PharmD Clinical Pharmacist 05/18/2023 12:15 PM

## 2023-05-18 NOTE — Plan of Care (Signed)
  Problem: Education: Goal: Ability to describe self-care measures that may prevent or decrease complications (Diabetes Survival Skills Education) will improve Outcome: Progressing Goal: Individualized Educational Video(s) Outcome: Progressing   Problem: Education: Goal: Individualized Educational Video(s) Outcome: Progressing   

## 2023-05-18 NOTE — ED Notes (Signed)
 Walked into pts room to find that the pt has removed cardiac monitoring devices. Pt was educated that she needs to keep on the cardiac monitoring devices due to the Hx of MI.

## 2023-05-18 NOTE — Evaluation (Signed)
 Physical Therapy Evaluation Patient Details Name: Lauren [name] MRN: [id number] DOB: 1981-03-28 Today's Date: 05/18/2023  History of Present Illness  a 43 y.o. female with medical history significant of type 1 DM (uses insulin  pump), seizures, CVA on coumadin , CKD stage 4. Known CAD with NSTEMI in July 2024. CKD with baseline creatinine 0.9-2.3 who presents to the emergency department due to midsternal, nonradiating, reproducible, dull chest pain which was rated as 6-7/10 on pain scale.  Patient states that she took home pain meds and states she has a history of CHF, she wanted to be sure that she was not having another episode of congestive heart failure, so she came to the ED for further evaluation and management.  Chest pain already resolved by the time she got to the ED (chest pain lasted about 45 minutes prior to resolving per patient).  Patient also complained of no urination within the last 3 days.  Patient was recently seen in the ED for left proximal humerus fx s/p fall 04/25/23.  She had recent admits 04/21/23- 04/24/23 for DKA and UTI and 03/2023 with seizure, DKA, and AKI  Clinical Impression   Pt tolerated today's Physical Therapy Evaluation, well with decent carryover for mobility. Pt reporting that is needing assist at home for most ADLs, pt lives with mother and she is not able to assist. Pt with max assist for donning/doffing personal brief in standing, min assist for functional mobility given, pt with limitations due to weakness, LUE in sling, and increased balance deficits. Based upon these deficits/impairments, patient will benefit from continued skilled physical therapy services during remainder of hospital stay and at the next recommended venue of care to address deficits and promote return to optimal function.                If plan is discharge home, recommend the following: Help with stairs or ramp for entrance;A lot of help with walking and/or transfers;A lot of help with  bathing/dressing/bathroom   Can travel by private vehicle   No    Equipment Recommendations    Recommendations for Other Services       Functional Status Assessment Patient has had a recent decline in their functional status and demonstrates the ability to make significant improvements in function in a reasonable and predictable amount of time.     Precautions / Restrictions Restrictions Weight Bearing Restrictions Per Provider Order: Yes LUE Weight Bearing Per Provider Order: Non weight bearing      Mobility  Bed Mobility Overal bed mobility: Needs Assistance Bed Mobility: Supine to Sit     Supine to sit: Min assist     General bed mobility comments: From flat HOB, upon assist pt starts crying, "I don't like feeling helpless" pt was given min assist to perform supine to sit. Unable to achieve independently from flat bed. Patient Response: Cooperative  Transfers Overall transfer level: Needs assistance Equipment used: 1 person hand held assist Transfers: Sit to/from Stand Sit to Stand: Min assist           General transfer comment: in RUE    Ambulation/Gait Ambulation/Gait assistance: Contact guard assist Gait Distance (Feet): 15 Feet Assistive device: None, 1 person hand held assist Gait Pattern/deviations: Step-through pattern, Decreased stride length, Wide base of support Gait velocity: decreased     General Gait Details: Increased lateral sway, wider BOS, CGA for safety  Stairs            Wheelchair Mobility     Tilt Bed Tilt  Bed Patient Response: Cooperative  Modified Rankin (Stroke Patients Only)       Balance Overall balance assessment: Needs assistance Sitting-balance support: No upper extremity supported Sitting balance-Leahy Scale: Good Sitting balance - Comments: sitting EOB   Standing balance support: No upper extremity supported Standing balance-Leahy Scale: Good Standing balance comment: no AD                              Pertinent Vitals/Pain Pain Assessment Pain Assessment: Faces Faces Pain Scale: Hurts a little bit Pain Location: LUE Pain Descriptors / Indicators: Discomfort, Guarding    Home Living Family/patient expects to be discharged to:: Private residence Living Arrangements: Parent (mother) Available Help at Discharge: Family;Available 24 hours/day Type of Home: Apartment Home Access: Level entry       Home Layout: One level Home Equipment: BSC/3in1;Tub bench;Rolling Walker (2 wheels);Wheelchair - manual;Grab bars - tub/shower;Shower seat Additional Comments: Pt plans to d/c to mother's handicapped accessible apartment; states she is working on getting disability and place of her own    Prior Function Prior Level of Function : Needs assist             Mobility Comments: does not utilize AD with household ambulation       Extremity/Trunk Assessment   Upper Extremity Assessment Upper Extremity Assessment: Defer to OT evaluation    Lower Extremity Assessment Lower Extremity Assessment: Generalized weakness    Cervical / Trunk Assessment Cervical / Trunk Assessment: Normal  Communication   Communication Communication: No apparent difficulties Cueing Techniques: Verbal cues;Tactile cues  Cognition Arousal: Alert Behavior During Therapy: Flat affect, WFL for tasks assessed/performed Overall Cognitive Status: No family/caregiver present to determine baseline cognitive functioning                                 General Comments: flat affect but follows commands appropriately        General Comments      Exercises     Assessment/Plan    PT Assessment Patient needs continued PT services  PT Problem List Decreased strength;Decreased activity tolerance;Decreased balance;Decreased mobility       PT Treatment Interventions DME instruction;Functional mobility training;Gait training;Therapeutic activities;Balance training;Therapeutic  exercise;Patient/family education    PT Goals (Current goals can be found in the Care Plan section)  Acute Rehab PT Goals Patient Stated Goal: discharge to rehab PT Goal Formulation: With patient Time For Goal Achievement: 06/01/23 Potential to Achieve Goals: Good    Frequency Min 2X/week     Co-evaluation               AM-PAC PT "6 Clicks" Mobility  Outcome Measure Help needed turning from your back to your side while in a flat bed without using bedrails?: A Little Help needed moving from lying on your back to sitting on the side of a flat bed without using bedrails?: A Little Help needed moving to and from a bed to a chair (including a wheelchair)?: A Little Help needed standing up from a chair using your arms (e.g., wheelchair or bedside chair)?: A Little Help needed to walk in hospital room?: A Little Help needed climbing 3-5 steps with a railing? : A Lot 6 Click Score: 17    End of Session Equipment Utilized During Treatment: Other (comment) (LUE sling) Activity Tolerance: Patient tolerated treatment well Patient left: in chair;with call bell/phone within reach;with chair  alarm set Nurse Communication: Mobility status PT Visit Diagnosis: Unsteadiness on feet (R26.81);History of falling (Z91.81)    Time: 9147-8295 PT Time Calculation (min) (ACUTE ONLY): 21 min   Charges:   PT Evaluation $PT Eval Low Complexity: 1 Low   PT General Charges $$ ACUTE PT VISIT: 1 Visit         Astrid Lay, DPT [facility] [place] [phone] office  Gatha Kaska 05/18/2023, 10:37 AM

## 2023-05-18 NOTE — Progress Notes (Signed)
Patient refusing to keep telemetry on.

## 2023-05-18 NOTE — Progress Notes (Signed)
 [facility] PROGRESS NOTE  Kasarah Moreno (DOB: [birth year]) FYB:[id number] PCP: Tobi Fortes, MD Outpatient Specialists: Podiatry, Dr. Leticia Raven  Brief Narrative: Lauren [name] is a 43 y.o. 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   She had recent admits 04/21/23- 04/24/23 for DKA and UTI and 03/2023 with seizure, DKA, and AKI.   Work up showed her to be afebrile with soft BP, with normocytic anemia and thrombocytosis, WBC normal at 8.2k. AKI noted (Cr 2.7 from baseline ~1.9). She had redness to the right foot, XR without significant osseous finding. She's had significant hypoglycemia and hyperglycemia thus far.   Subjective: Sparse historian, says she doesn't have any symptoms of infection anywhere, then specifically denies cough, shortness of breath, current chest pain, dysuria, but says she does have a rash, redness on her right foot that is "an infection." Has been there for several days, but she's unclear on exact time frame. She's been urinating since being here, but says she didn't urinate for many days PTA. No new numbness or weakness.   Objective: BP (!) 134/96   Pulse 93   Temp 98.9 F (37.2 C)   Resp 20   Ht 5\' 5"  (1.651 m)   Wt 94 kg   LMP 05/05/2023 (Exact Date)   SpO2 100%   BMI 34.49 kg/m   Gen: Chronically ill-appearing female in no distress Pulm: Clear, nonlabored  CV: RRR, no MRG, BL LE edema. GI: Soft, NT, ND, +BS  Neuro: Alert and interactive, no eye contact, oriented for the most part. L hemiparesis is mild. No new focal deficits. Ext: Warm, dry. Erythematous R foot as  pictured below. No palpable fluctuance and no exudate from wounds.     Assessment & Plan: Right foot cellulitis: Given her poorly-controlled diabetes and significant peripheral neuropathy, as well as history of osteomyelitis, we will pursue cross sectional imaging to r/o deep space infection/osteomyelitis. No evidence of sepsis with no fever and normal WBC.  - Start broad coverage with vancomycin  (hx MRSA) and cefepime  (has tolerated this without issue last year).  - MRI right foot. If significant abnormalities, pt would like to be evaluated by her podiatrist, Dr. Leticia Raven, who I could contact but does not round at this hospital.   Poorly controlled T1DM: With hyper- and hypo- glycemia. On insulin  pump, though may not be managing very well at home. Dx age 71.  - Based on insulin  pump basal ~21u/day, will order 15u glargine daily  - Give 3u TIDWC and continue sensitive SSI. Will change to AC/HS instead of q4h to avoid nocturnal hypoglycemia.  AKI on stage IV CKD: Due to dehydration - Check renal US  - Check UA  - Will monitor renal parameters daily. Appears to be hypovolemic and still with creatinine above baseline, so will give 50cc/hr IVF  Normocytic anemia, suspect AOCD/of CKD:  - Recheck CBC this PM with type and screen since hgb down to 7.3g/dl and soft BP on arrival.   History of CVA, carotid stenosis: Right hemisphere encephalomalacia noted on CT head with stent in right ICA.  - Continue coumadin , dosed thru pharmacy. INR 1.9 today.   Hypokalemia:  - Resolved with supplementation.  Chronic combined HFrEF, HTN: Echo within the past month showed LVEF 30-35%.  - Judicious IV fluids - Continue coreg , not on ACE/ARB/ARNI due to advanced CKD. Would initiate nitrate/hydralazine  if needed for BP management.   CAD with elevated troponin consistent with demand myocardial ischemia: Atypical chest pain resolved spontaneously, does not seem to represent ACS at this time.  - Continue plavix ,  statin (can continue now that LFTs are back to normal), beta blocker - Pt is on anticoagulation regardless, and would not be a candidate for cardiac catheterization per notes from last hospitalization.  - Note pt consistently refuses cardiac monitoring.   Left humerus fracture:  - Remain in sling, nonoperative management based on fracture position and adverse risk profile. - Pain control with percocet.  - Orthopedics outpatient follow up, Dr. Phyllis Breeze recommended repeat CR in 2 weeks at time of office visit 1/9 (would be ~1/23).   Falls at home: Some left hemiparesis from CVA noted.  - PT/OT evaluations performed, pt would benefit from rehabilitation at SNF level of care.   Hypothyroidism: TSH 2.105.  - Continue synthroid .   GERD:  - PPI  Obesity, class 1: Body mass index is 34.49 kg/m.   DNR: POA. Noted.   Wynetta Heckle, MD [facility] [email or web address] 05/18/2023, 3:32 PM

## 2023-05-18 NOTE — Progress Notes (Signed)
 Patients BS 60 given coca cola and will recheck , Dr. Fausto Hooker is aware

## 2023-05-18 NOTE — Progress Notes (Signed)
 Patient and patients daughter refused fluids at this time. Patient did allow this writer to give her insulin . Dr. Fausto Hooker is aware of the refusal of LR , and will talk with patient and family . Patient refused to be catherized for urine collection. Placed a urine hat in her restroom and advised her not to put toilet paper in if she needs to go to the restroom. Patient and daughter verbalized understanding, although at some points during day patient seems to have some confusion. She would ambulate to bathroom and stand in front of toilet and had to be redirected by staff to stand in front of toilet and then sit down , she does have a steady gait.  She is in bed at this time. Vancomycin  is still infusing to RFA. Sling in place to Left shoulder.

## 2023-05-18 NOTE — Progress Notes (Signed)
 Patient arrived to room. VSS. Tele placed on patient. Call bell within reach and bed alarm on.

## 2023-05-18 NOTE — Progress Notes (Signed)
 Patient c/o feeling dizzy CBG is 334 Her vs stable BP 134/96

## 2023-05-18 NOTE — Progress Notes (Signed)
 Patient continues to refuse to put telemetry on I have offered x 2. NT Hollis Lurie has also offered and patient declined, Dr. Fausto Hooker is aware

## 2023-05-18 NOTE — TOC Initial Note (Signed)
 Transition of Care [facility]) - Initial/Assessment Note    Patient Details  Name: Lauren [name] MRN: [id number] Date of Birth: [birth year]  Transition of [facility]) CM/SW Contact:    Grandville Lax, LCSWA Phone Number: 05/18/2023, 1:38 PM  Clinical Narrative:                 Pt is high risk for readmission. PT is recommending SNF for pt at D/C. CSW spoke with pt at bedside to complete assessment. Pt lives with her mother currently. Pt is able to do some ADLs and needs some assistance from family. Pts family provides transportation. Pt has had HH with [facility] in the past. Pt has a wheelchair and all other needed DME in the home. Pt states that she is agreeable to SNF referral being sent out for review. TOC to follow.   Expected Discharge Plan: Skilled Nursing Facility Barriers to Discharge: Continued Medical Work up   Patient Goals and CMS Choice Patient states their goals for this hospitalization and ongoing recovery are:: go to SNF CMS Medicare.gov Compare Post Acute Care list provided to:: Patient Choice offered to / list presented to : Patient      Expected Discharge Plan and Services In-house Referral: Clinical Social Work Discharge Planning Services: CM Consult Post Acute Care Choice: Skilled Nursing Facility Living arrangements for the past 2 months: Single Family Home                                      Prior Living Arrangements/Services Living arrangements for the past 2 months: Single Family Home Lives with:: Parents Patient language and need for interpreter reviewed:: Yes Do you feel safe going back to the place where you live?: Yes      Need for Family Participation in Patient Care: Yes (Comment) Care giver support system in place?: Yes (comment) Current home services: DME Criminal Activity/Legal Involvement Pertinent to Current Situation/Hospitalization: No - Comment as needed  Activities of Daily Living      Permission Sought/Granted                   Emotional Assessment Appearance:: Appears stated age Attitude/Demeanor/Rapport: Engaged Affect (typically observed): Accepting Orientation: : Oriented to Self, Oriented to Place, Oriented to Situation, Oriented to  Time Alcohol  / Substance Use: Not Applicable Psych Involvement: No (comment)  Admission diagnosis:  Metabolic encephalopathy [G93.41] Cellulitis of right lower extremity [L03.115] Acute kidney injury (HCC) [N17.9] Acute metabolic encephalopathy [G93.41] Chest pain, unspecified type [R07.9] Patient Active Problem List   Diagnosis Date Noted   Dehydration 05/18/2023   Type 2 diabetes mellitus with hypoglycemia (HCC) 05/18/2023   Thrombocytosis 05/18/2023   Closed fracture of left proximal humerus 05/18/2023   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) 05/11/2023   Hemiparesis affecting left side as late effect of cerebrovascular accident (CVA) (HCC) 05/03/2023   Left humeral fracture 05/02/2023   Elevated LFTs 05/02/2023   SEMI (subendocardial myocardial infarction) (HCC) 04/30/2023   DKA (diabetic ketoacidosis) (HCC) 04/21/2023   GERD (gastroesophageal reflux disease) 04/21/2023   Acute metabolic encephalopathy 04/21/2023   Hypoalbuminemia due to protein-calorie malnutrition (HCC) 04/21/2023   Prolonged QT interval 04/21/2023   Seizure (HCC) 03/13/2023   Behavior concern in adult 03/06/2023   Chest pain due to CAD (HCC) 11/07/2022   Dizziness 10/11/2022   History of blood clots 09/29/2022   Abnormal TSH 09/13/2022   Lung nodule seen  on imaging study 08/11/2022   Chronic nausea 08/02/2022   PVD (peripheral vascular disease) (HCC) 07/19/2022   DNR (do not resuscitate) 07/19/2022   Insomnia 04/07/2022   Ischemic cardiomyopathy    Elevated troponin    HFrEF (heart failure with reduced ejection fraction) /chronic systolic dysfunction CHF with EF of 30 to 35%    Right carotid artery occlusion 01/08/2022   Internal carotid artery occlusion,  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   Menorrhagia with irregular cycle 12/15/2021   Foot ulcer (HCC) 08/27/2021   Hypokalemia 08/27/2021   Chronic combined systolic and diastolic heart failure (HCC) 06/18/2021   History of CVA (cerebrovascular accident) 06/18/2021   Menorrhagia with regular cycle 07/23/2020   Iron  deficiency anemia due to chronic blood loss 07/23/2020   Menstrual cramps 07/23/2020   Obesity, Class II, BMI 35-39.9 06/18/2020   CAD (coronary artery disease) 06/18/2020   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) - baseline Scr 2.8-3.0 06/18/2020   Angina at rest [facility]) 06/18/2020   Mixed hyperlipidemia    Class 2 obesity due to excess calories without serious comorbidity with body mass index (BMI) of 39.0 to 39.9 in adult    Acute kidney injury superimposed on stage 4 chronic kidney disease (HCC) 01/01/2020   Tobacco abuse 01/01/2020   Atypical chest pain 12/31/2019   Left sided numbness 12/31/2019   Vitamin D  deficiency 07/24/2015   Essential hypertension 03/17/2015   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 [place] [address] Phone: [phone] Fax: [phone]  [facility] - [address] [place] - [address] [place] [address] Phone: [phone] Fax: [phone]  [facility] - [place]  - [place], [place] - [address] [place] [address] Phone: [phone] Fax: [phone]     Social Drivers of Health (SDOH) Social History: SDOH Screenings   Food Insecurity: Food Insecurity Present (05/18/2023)  Housing: Low Risk  (05/18/2023)  Transportation Needs: Unmet  Transportation Needs (05/18/2023)  Utilities: Patient Declined (05/18/2023)  Alcohol  Screen: Low Risk  (11/09/2022)  Depression (PHQ2-9): Low Risk  (02/18/2023)  Recent Concern: Depression (PHQ2-9) - Medium Risk (02/04/2023)  Financial Resource Strain: Low Risk  (11/09/2022)  Physical Activity: Inactive (03/03/2022)  Social Connections: Socially Integrated (03/03/2022)  Stress: No Stress Concern Present (03/03/2022)  Recent Concern: Stress - Stress Concern Present (12/22/2021)  Tobacco Use: Medium Risk (05/17/2023)   SDOH Interventions: Food Insecurity Interventions: Other (Comment) Housing Interventions: Intervention Not Indicated   Readmission Risk Interventions    05/18/2023    1:36 PM 04/22/2023    3:05 PM 03/16/2023   11:32 AM  Readmission Risk Prevention Plan  Transportation Screening Complete Complete Complete  Medication Review Oceanographer) Complete Complete Complete  PCP or Specialist appointment within 3-5 days of discharge  Not Complete Not Complete  HRI or Home Care Consult Complete Complete Complete  SW Recovery Care/Counseling Consult Complete Complete Complete  Palliative Care Screening Not Applicable Not Applicable Not Applicable  Skilled Nursing Facility Complete Not Applicable Not Applicable

## 2023-05-18 NOTE — Plan of Care (Signed)
  Problem: Acute Rehab PT Goals(only PT should resolve) Goal: Pt Will Go Supine/Side To Sit Flowsheets (Taken 05/18/2023 1040) Pt will go Supine/Side to Sit: with modified independence Goal: Patient Will Transfer Sit To/From Stand Flowsheets (Taken 05/18/2023 1040) Patient will transfer sit to/from stand: with modified independence Goal: Pt Will Transfer Bed To Chair/Chair To Bed Flowsheets (Taken 05/18/2023 1040) Pt will Transfer Bed to Chair/Chair to Bed: with modified independence Goal: Pt Will Ambulate Flowsheets (Taken 05/18/2023 1040) Pt will Ambulate:  50 feet  75 feet  with modified independence  with least restrictive assistive device  Gatha Kaska PT, DPT [facility] [place] 336 [phone] office

## 2023-05-18 NOTE — Evaluation (Signed)
 Occupational Therapy Evaluation Patient Details Name: Lauren [name] MRN: [id number] DOB: 1980-06-17 Today's Date: 05/18/2023   History of Present Illness a 43 y.o. female with medical history significant of type 1 DM (uses insulin  pump), seizures, CVA on coumadin , CKD stage 4. Known CAD with NSTEMI in July 2024. CKD with baseline creatinine 0.9-2.3 who presents to the emergency department due to midsternal, nonradiating, reproducible, dull chest pain which was rated as 6-7/10 on pain scale.  Patient states that she took home pain meds and states she has a history of CHF, she wanted to be sure that she was not having another episode of congestive heart failure, so she came to the ED for further evaluation and management.  Chest pain already resolved by the time she got to the ED (chest pain lasted about 45 minutes prior to resolving per patient).  Patient also complained of no urination within the last 3 days.  Patient was recently seen in the ED for left proximal humerus fx s/p fall 04/25/23.  She had recent admits 04/21/23- 04/24/23 for DKA and UTI and 03/2023 with seizure, DKA, and AKI   Clinical Impression   Pt admitted for concerns listed above. Pt not answering most questions this session, all prior history obtained from PT, RN, and Chart. At this time, pt requiring max verbal and tactile cuing for safety, due to impulsivity and difficulty following commands. She stood up multiple times during session despite request to stay seated. With ADL's, due to LUE fracture and previous stroke effecting RUE, pt has increased difficulty completing ADL's, requiring up to max assist. Recommending further skilled rehab at this time due to no assist at home, as her mother has a caregiver for herself and pt requiring max assist for ADL's. OT will continue to follow acutely.        If plan is discharge home, recommend the following: A little help with walking and/or transfers;A lot of help with  bathing/dressing/bathroom;Assistance with cooking/housework;Direct supervision/assist for medications management;Direct supervision/assist for financial management;Help with stairs or ramp for entrance    Functional Status Assessment  Patient has had a recent decline in their functional status and demonstrates the ability to make significant improvements in function in a reasonable and predictable amount of time.  Equipment Recommendations  None recommended by OT    Recommendations for Other Services       Precautions / Restrictions Precautions Precautions: Fall Restrictions Weight Bearing Restrictions Per Provider Order: Yes LUE Weight Bearing Per Provider Order: Non weight bearing Other Position/Activity Restrictions: Remain in sling      Mobility Bed Mobility               General bed mobility comments: Pt up in chair on entry, did not want to lay down.    Transfers Overall transfer level: Independent Equipment used: None               General transfer comment: Pt stood up from recliner, chair, and toilet with no assist.      Balance Overall balance assessment: Needs assistance Sitting-balance support: No upper extremity supported Sitting balance-Leahy Scale: Good Sitting balance - Comments: sitting EOB     Standing balance-Leahy Scale: Good Standing balance comment: no AD                           ADL either performed or assessed with clinical judgement   ADL Overall ADL's : Needs assistance/impaired Eating/Feeding: Set up;Sitting   Grooming:  Supervision/safety;Standing   Upper Body Bathing: Minimal assistance   Lower Body Bathing: Moderate assistance;Sitting/lateral leans;Sit to/from stand   Upper Body Dressing : Moderate assistance;Sitting   Lower Body Dressing: Moderate assistance;Sitting/lateral leans;Sit to/from stand   Toilet Transfer: Minimal assistance;Ambulation   Toileting- Clothing Manipulation and Hygiene: Minimal  assistance;Sitting/lateral lean;Sit to/from stand         General ADL Comments: Pt very unsafe with completing ADL's, due to poor balance and impulsivity. She required max cuing and hands on assist throughout.     Vision Baseline Vision/History: 1 Wears glasses Ability to See in Adequate Light: 0 Adequate Patient Visual Report: No change from baseline Vision Assessment?: No apparent visual deficits     Perception         Praxis         Pertinent Vitals/Pain Pain Assessment Pain Assessment: 0-10 Pain Score: 10-Worst pain ever Pain Location: feet Pain Descriptors / Indicators: Grimacing, Moaning Pain Intervention(s): Limited activity within patient's tolerance, Monitored during session, Repositioned, RN gave pain meds during session     Extremity/Trunk Assessment Upper Extremity Assessment Upper Extremity Assessment: Generalized weakness;LUE deficits/detail LUE Deficits / Details: LUE half out of sling, refusing help from OT to replce sling properly. LUE Coordination: decreased gross motor   Lower Extremity Assessment Lower Extremity Assessment: Defer to PT evaluation   Cervical / Trunk Assessment Cervical / Trunk Assessment: Normal   Communication Communication Communication: No apparent difficulties Cueing Techniques: Verbal cues;Gestural cues;Tactile cues   Cognition Arousal: Alert Behavior During Therapy: Impulsive, Flat affect Overall Cognitive Status: No family/caregiver present to determine baseline cognitive functioning                                 General Comments: Very impulsive standing up mutliple times during session despite being asked to sit down, then shouting "I need help I need help I'm going to fall"     General Comments  VSS on RA.    Exercises     Shoulder Instructions      Home Living Family/patient expects to be discharged to:: Private residence Living Arrangements: Parent Available Help at Discharge:  Family;Available 24 hours/day Type of Home: Apartment Home Access: Level entry     Home Layout: One level     Bathroom Shower/Tub: Producer, television/film/video: Handicapped height Bathroom Accessibility: Yes   Home Equipment: BSC/3in1;Tub bench;Rolling Walker (2 wheels);Wheelchair - manual;Grab bars - tub/shower;Shower seat   Additional Comments: Pt plans to d/c to mother's handicapped accessible apartment; states she is working on getting disability and place of her own      Prior Functioning/Environment Prior Level of Function : Needs assist             Mobility Comments: Refuses all DME, will only do HHA ADLs Comments: Pt with limited reponses on ADL abilities, living arrangements and history taken from pt chart and RN.        OT Problem List: Decreased strength;Decreased range of motion;Decreased activity tolerance;Impaired balance (sitting and/or standing);Decreased coordination;Decreased safety awareness;Decreased knowledge of use of DME or AE;Decreased cognition;Impaired sensation;Impaired UE functional use;Pain      OT Treatment/Interventions: Self-care/ADL training;Therapeutic exercise;Energy conservation;DME and/or AE instruction;Therapeutic activities;Cognitive remediation/compensation;Patient/family education;Balance training    OT Goals(Current goals can be found in the care plan section) Acute Rehab OT Goals Patient Stated Goal: None stated this session OT Goal Formulation: With patient Time For Goal Achievement: 06/01/23 Potential to Achieve Goals:  Good ADL Goals Pt Will Perform Grooming: with modified independence;standing Pt Will Perform Lower Body Bathing: sitting/lateral leans;sit to/from stand;with supervision Pt Will Perform Lower Body Dressing: sitting/lateral leans;sit to/from stand;with supervision Pt Will Transfer to Toilet: with modified independence;ambulating Pt Will Perform Toileting - Clothing Manipulation and hygiene: with modified  independence;sitting/lateral leans;sit to/from stand  OT Frequency: Min 1X/week    Co-evaluation              AM-PAC OT "6 Clicks" Daily Activity     Outcome Measure Help from another person eating meals?: A Little Help from another person taking care of personal grooming?: A Little Help from another person toileting, which includes using toliet, bedpan, or urinal?: A Lot Help from another person bathing (including washing, rinsing, drying)?: A Lot Help from another person to put on and taking off regular upper body clothing?: A Little Help from another person to put on and taking off regular lower body clothing?: A Lot 6 Click Score: 15   End of Session Equipment Utilized During Treatment: Gait belt Nurse Communication: Mobility status  Activity Tolerance: Patient tolerated treatment well Patient left: in chair;with call bell/phone within reach;with chair alarm set  OT Visit Diagnosis: Unsteadiness on feet (R26.81);Other abnormalities of gait and mobility (R26.89);Muscle weakness (generalized) (M62.81)                Time: 1610-9604 OT Time Calculation (min): 16 min Charges:  OT General Charges $OT Visit: 1 Visit OT Evaluation $OT Eval Moderate Complexity: 1 Mod  Malikah Principato, OTR/L [facility]  Cheyne Bungert Judyann Number 05/18/2023, 3:57 PM

## 2023-05-18 NOTE — ED Notes (Signed)
 Pt was placed on a periwick due to being too weak to get out of bed.

## 2023-05-18 NOTE — NC FL2 (Signed)
 [place]  MEDICAID FL2 LEVEL OF CARE FORM     IDENTIFICATION  Patient Name: Lauren [name] Birthdate: 09/22/1980 Sex: female Admission Date (Current Location): 05/17/2023  [facility] and [place] Number:  [employer] and Address:  [facility],  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) 05/11/2023   Hemiparesis affecting left side as late effect of cerebrovascular accident (CVA) (HCC) 05/03/2023   Left humeral fracture 05/02/2023   Elevated LFTs 05/02/2023   SEMI (subendocardial myocardial infarction) (HCC) 04/30/2023   DKA (diabetic ketoacidosis) (HCC) 04/21/2023   GERD (gastroesophageal reflux disease) 04/21/2023   Acute metabolic encephalopathy 04/21/2023   Hypoalbuminemia due to protein-calorie malnutrition (HCC) 04/21/2023   Prolonged QT interval 04/21/2023   Seizure (HCC) 03/13/2023   Behavior concern in adult 03/06/2023   Chest pain due to CAD (HCC) 11/07/2022   Dizziness 10/11/2022   History of blood clots 09/29/2022   Abnormal TSH 09/13/2022   Lung nodule seen on imaging study 08/11/2022   Chronic nausea 08/02/2022   PVD (peripheral vascular disease) (HCC) 07/19/2022   DNR (do not resuscitate) 07/19/2022   Insomnia 04/07/2022   Ischemic cardiomyopathy    Elevated troponin    HFrEF (heart failure with reduced ejection fraction)  /chronic systolic dysfunction CHF with EF of 30 to 35%    Right carotid artery occlusion 01/08/2022   Internal carotid artery occlusion, 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   Menorrhagia with irregular cycle 12/15/2021   Foot ulcer (HCC) 08/27/2021   Hypokalemia 08/27/2021   Chronic combined systolic and diastolic heart failure (HCC) 06/18/2021   History of CVA (cerebrovascular accident) 06/18/2021   Menorrhagia with regular cycle 07/23/2020   Iron  deficiency anemia due to chronic blood loss 07/23/2020   Menstrual cramps 07/23/2020   Obesity, Class II, BMI 35-39.9 06/18/2020   CAD (coronary artery disease) 06/18/2020   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) - baseline Scr 2.8-3.0 06/18/2020   Angina at rest [facility]) 06/18/2020   Mixed hyperlipidemia    Class 2 obesity due to excess calories without serious comorbidity with body mass index (BMI) of 39.0 to 39.9 in adult    Acute kidney injury superimposed on stage 4 chronic kidney disease (HCC) 01/01/2020   Tobacco abuse 01/01/2020   Atypical chest pain 12/31/2019   Left sided numbness 12/31/2019   Vitamin D  deficiency 07/24/2015   Essential hypertension 03/17/2015   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) 03/07/2015   Acquired hypothyroidism 03/07/2015    Orientation RESPIRATION BLADDER Height & Weight     Self, Time, Situation, Place  Normal Continent Weight: 207 lb 3.7 oz (94 kg) Height:  5\' 5"  (165.1 cm)  BEHAVIORAL SYMPTOMS/MOOD NEUROLOGICAL BOWEL NUTRITION STATUS      Continent Diet (heart healthy)  AMBULATORY STATUS COMMUNICATION OF NEEDS Skin   Extensive Assist Verbally Normal  Personal Care Assistance Level of Assistance  Bathing, Feeding, Dressing Bathing Assistance: Limited assistance Feeding assistance: Independent Dressing Assistance: Limited assistance     Functional Limitations Info  Sight, Hearing, Speech Sight Info: Impaired Hearing Info: Adequate Speech Info: Adequate     SPECIAL CARE FACTORS FREQUENCY  PT (By licensed PT), OT (By licensed OT)     PT Frequency: 5 times weekly OT Frequency: 5 times weekly            Contractures Contractures Info: Not present    Additional Factors Info  Code Status, Allergies Code Status Info: DNR Allergies Info: Visipaque  (Iodixanol ), Wellbutrin (Bupropion), Gabapentin , Cefepime , Ciprofloxacin  Hcl, Tape           Current Medications (05/18/2023):  This is the current hospital active medication list Current Facility-Administered Medications  Medication Dose Route Frequency Provider Last Rate Last Admin   acetaminophen  (TYLENOL ) tablet 650 mg  650 mg Oral Q6H PRN Adefeso, Oladapo, DO       Or   acetaminophen  (TYLENOL ) suppository 650 mg  650 mg Rectal Q6H PRN Adefeso, Oladapo, DO       atorvastatin  (LIPITOR) tablet 40 mg  40 mg Oral Daily Adefeso, Oladapo, DO   40 mg at 05/18/23 0853   ceFEPIme  (MAXIPIME ) 2 g in sodium chloride  0.9 % 100 mL IVPB  2 g Intravenous Q12H Wynetta Heckle, MD       clopidogrel  (PLAVIX ) tablet 75 mg  75 mg Oral Daily Adefeso, Oladapo, DO   75 mg at 05/18/23 0854   dextrose  50 % solution 12.5 g  12.5 g Intravenous STAT Wynetta Heckle, MD       dextrose  50 % solution            insulin  aspart (novoLOG ) injection 0-9 Units  0-9 Units Subcutaneous Q4H Adefeso, Oladapo, DO   5 Units at 05/18/23 1230   levothyroxine  (SYNTHROID ) tablet 125 mcg  125 mcg Oral Q0600 Adefeso, Oladapo, DO       oxyCODONE -acetaminophen  (PERCOCET/ROXICET) 5-325 MG per tablet 1 tablet  1 tablet Oral Q4H PRN Adefeso, Oladapo, DO       pantoprazole  (PROTONIX ) EC tablet 40 mg  40 mg Oral Daily Adefeso, Oladapo, DO   40 mg at 05/18/23 0853   prochlorperazine  (COMPAZINE ) injection 10 mg  10 mg Intravenous Q6H PRN Adefeso, Oladapo, DO       [START ON 05/20/2023] vancomycin  (VANCOREADY) IVPB 1250 mg/250 mL  1,250 mg Intravenous Q48H Wynetta Heckle, MD       vancomycin  (VANCOREADY) IVPB 2000 mg/400 mL  2,000 mg Intravenous  Once Grunz, Ryan B, MD       warfarin (COUMADIN ) tablet 5 mg  5 mg Oral Q M,W,F,Su-1800 Adefeso, Oladapo, DO       And   [START ON 05/19/2023] warfarin (COUMADIN ) tablet 2.5 mg  2.5 mg Oral Once per day on Tuesday Thursday Saturday Adefeso, Oladapo, DO         Discharge Medications: Please see discharge summary for a list of discharge medications.  Relevant Imaging Results:  Relevant Lab Results:   Additional Information SSN: [id number] LCSWA

## 2023-05-18 NOTE — Progress Notes (Signed)
 Rechecked CBG 83

## 2023-05-19 DIAGNOSIS — N184 Chronic kidney disease, stage 4 (severe): Secondary | ICD-10-CM | POA: Diagnosis not present

## 2023-05-19 DIAGNOSIS — N179 Acute kidney failure, unspecified: Secondary | ICD-10-CM | POA: Diagnosis not present

## 2023-05-19 LAB — CBC
HCT: 26.6 % — ABNORMAL LOW (ref 36.0–46.0)
Hemoglobin: 7.9 g/dL — ABNORMAL LOW (ref 12.0–15.0)
MCH: 28 pg (ref 26.0–34.0)
MCHC: 29.7 g/dL — ABNORMAL LOW (ref 30.0–36.0)
MCV: 94.3 fL (ref 80.0–100.0)
Platelets: 472 10*3/uL — ABNORMAL HIGH (ref 150–400)
RBC: 2.82 MIL/uL — ABNORMAL LOW (ref 3.87–5.11)
RDW: 16.9 % — ABNORMAL HIGH (ref 11.5–15.5)
WBC: 5.7 10*3/uL (ref 4.0–10.5)
nRBC: 0 % (ref 0.0–0.2)

## 2023-05-19 LAB — GLUCOSE, CAPILLARY
Glucose-Capillary: 202 mg/dL — ABNORMAL HIGH (ref 70–99)
Glucose-Capillary: 166 mg/dL — ABNORMAL HIGH (ref 70–99)
Glucose-Capillary: 75 mg/dL (ref 70–99)
Glucose-Capillary: 123 mg/dL — ABNORMAL HIGH (ref 70–99)

## 2023-05-19 LAB — BASIC METABOLIC PANEL
Anion gap: 9 (ref 5–15)
BUN: 31 mg/dL — ABNORMAL HIGH (ref 6–20)
CO2: 21 mmol/L — ABNORMAL LOW (ref 22–32)
Calcium: 7.9 mg/dL — ABNORMAL LOW (ref 8.9–10.3)
Chloride: 106 mmol/L (ref 98–111)
Creatinine, Ser: 2.27 mg/dL — ABNORMAL HIGH (ref 0.44–1.00)
GFR, Estimated: 27 mL/min — ABNORMAL LOW (ref >60)
Glucose, Bld: 191 mg/dL — ABNORMAL HIGH (ref 70–99)
Potassium: 3.7 mmol/L (ref 3.5–5.1)
Sodium: 136 mmol/L (ref 135–145)

## 2023-05-19 LAB — PROTIME-INR
INR: 2.1 — ABNORMAL HIGH (ref 0.8–1.2)
Prothrombin Time: 23.8 s — ABNORMAL HIGH (ref 11.4–15.2)

## 2023-05-19 MED ORDER — ISOSORBIDE MONONITRATE ER 60 MG PO TB24
60.0000 mg | ORAL_TABLET | Freq: Every day | ORAL | Status: DC
  Administered 2023-05-19 – 2023-05-28 (×7): 60 mg via ORAL
  Filled 2023-05-19 (×9): qty 1

## 2023-05-19 MED ORDER — SODIUM BICARBONATE 650 MG PO TABS
650.0000 mg | ORAL_TABLET | Freq: Two times a day (BID) | ORAL | Status: DC
  Administered 2023-05-19 – 2023-05-28 (×18): 650 mg via ORAL
  Filled 2023-05-19 (×18): qty 1

## 2023-05-19 MED ORDER — SODIUM CHLORIDE 0.9 % IV SOLN
2.0000 g | INTRAVENOUS | Status: DC
  Administered 2023-05-19 – 2023-05-24 (×5): 2 g via INTRAVENOUS
  Filled 2023-05-19 (×5): qty 20

## 2023-05-19 MED ORDER — ALPRAZOLAM 0.25 MG PO TABS
0.2500 mg | ORAL_TABLET | Freq: Three times a day (TID) | ORAL | Status: DC | PRN
  Administered 2023-05-19 – 2023-05-24 (×9): 0.25 mg via ORAL
  Filled 2023-05-19 (×10): qty 1

## 2023-05-19 MED ORDER — LORAZEPAM 2 MG/ML IJ SOLN
0.5000 mg | INTRAMUSCULAR | Status: DC | PRN
  Administered 2023-05-19 – 2023-05-21 (×5): 0.5 mg via INTRAVENOUS
  Filled 2023-05-19 (×6): qty 1

## 2023-05-19 MED ORDER — HYDRALAZINE HCL 25 MG PO TABS
25.0000 mg | ORAL_TABLET | Freq: Two times a day (BID) | ORAL | Status: DC
  Administered 2023-05-19 – 2023-05-28 (×14): 25 mg via ORAL
  Filled 2023-05-19 (×19): qty 1

## 2023-05-19 MED ORDER — TOPIRAMATE 25 MG PO TABS
50.0000 mg | ORAL_TABLET | Freq: Two times a day (BID) | ORAL | Status: DC
  Administered 2023-05-19 – 2023-05-28 (×18): 50 mg via ORAL
  Filled 2023-05-19 (×19): qty 2

## 2023-05-19 MED ORDER — LINEZOLID 600 MG/300ML IV SOLN
600.0000 mg | Freq: Two times a day (BID) | INTRAVENOUS | Status: DC
  Administered 2023-05-19 – 2023-05-26 (×13): 600 mg via INTRAVENOUS
  Filled 2023-05-19 (×20): qty 300

## 2023-05-19 MED ORDER — TRAZODONE HCL 100 MG PO TABS
100.0000 mg | ORAL_TABLET | Freq: Every evening | ORAL | Status: DC | PRN
  Administered 2023-05-20 – 2023-05-27 (×7): 100 mg via ORAL
  Filled 2023-05-19 (×4): qty 1
  Filled 2023-05-19: qty 2
  Filled 2023-05-19 (×4): qty 1

## 2023-05-19 MED ORDER — METOPROLOL SUCCINATE ER 50 MG PO TB24
50.0000 mg | ORAL_TABLET | Freq: Every day | ORAL | Status: DC
  Administered 2023-05-19 – 2023-05-28 (×8): 50 mg via ORAL
  Filled 2023-05-19: qty 2
  Filled 2023-05-19: qty 1
  Filled 2023-05-19: qty 2
  Filled 2023-05-19: qty 1
  Filled 2023-05-19: qty 2
  Filled 2023-05-19 (×2): qty 1
  Filled 2023-05-19 (×2): qty 2
  Filled 2023-05-19: qty 1

## 2023-05-19 NOTE — TOC Progression Note (Signed)
Transition of Care [facility]) - Progression Note    Patient Details  Name: Lauren [name] MRN: [id number] Date of Birth: 04/22/1981  Transition of Care [facility]) CM/SW Contact  Villa Her[place], [place] Phone Number: 05/19/2023, 10:45 AM  Clinical Narrative:    CSW spoke with pts daughter to update on plan for SNF once pt is medically stable. CSW explained that SNF referral has been sent out for review by local facilities. CSW updated that plan is currently for transfer to [facility]. Pts daughter is requesting MD give her a call to provide update when able, CSW relayed this message to MD via secure chat. TOC to follow.   Expected Discharge Plan: Skilled Nursing Facility Barriers to Discharge: Continued Medical Work up  Expected Discharge Plan and Services In-house Referral: Clinical Social Work Discharge Planning Services: CM Consult Post Acute Care Choice: Skilled Nursing Facility Living arrangements for the past 2 months: Single Family Home                                       Social Determinants of Health (SDOH) Interventions SDOH Screenings   Food Insecurity: Food Insecurity Present (05/18/2023)  Housing: Low Risk  (05/18/2023)  Transportation Needs: Unmet Transportation Needs (05/18/2023)  Utilities: Patient Declined (05/18/2023)  Alcohol Screen: Low Risk  (11/09/2022)  Depression (PHQ2-9): Low Risk  (02/18/2023)  Recent Concern: Depression (PHQ2-9) - Medium Risk (02/04/2023)  Financial Resource Strain: Low Risk  (11/09/2022)  Physical Activity: Inactive (03/03/2022)  Social Connections: Socially Integrated (03/03/2022)  Stress: No Stress Concern Present (03/03/2022)  Recent Concern: Stress - Stress Concern Present (12/22/2021)  Tobacco Use: Medium Risk (05/17/2023)    Readmission Risk Interventions    05/18/2023    1:36 PM 04/22/2023    3:05 PM 03/16/2023   11:32 AM  Readmission Risk Prevention Plan  Transportation Screening Complete Complete Complete  Medication Review Special educational needs teacher) Complete Complete Complete  PCP or Specialist appointment within 3-5 days of discharge  Not Complete Not Complete  HRI or Home Care Consult Complete Complete Complete  SW Recovery Care/Counseling Consult Complete Complete Complete  Palliative Care Screening Not Applicable Not Applicable Not Applicable  Skilled Nursing Facility Complete Not Applicable Not Applicable

## 2023-05-19 NOTE — Plan of Care (Signed)
  Problem: Health Behavior/Discharge Planning: Goal: Ability to identify and utilize available resources and services will improve Outcome: Not Progressing

## 2023-05-19 NOTE — Progress Notes (Signed)
PHARMACY - ANTICOAGULATION CONSULT NOTE  Pharmacy Consult for warfarin Indication: CVA  Allergies  Allergen Reactions   Visipaque [Iodixanol] Nausea And Vomiting    Patient had vagal response during procedure became hypertensive, and bradycardic. CO2 used during procedure prior to contrast being used, this may be cause as well. Recommended premedicating prior to contrast being used in the future.    Wellbutrin [Bupropion] Hives   Gabapentin     dizzy   Cefepime Rash    Tolerates penicilllin   Ciprofloxacin Hcl Hives and Rash    Hives/rash at injection site; 01/15/22 tolerated IV cipro   Tape Rash    Patient Measurements: Height: 5\' 5"  (165.1 cm) Weight: 94 kg (207 lb 3.7 oz) IBW/kg (Calculated) : 57 Heparin Dosing Weight:   Vital Signs: Temp: 97.6 F (36.4 C) (01/16 0348) Temp Source: Oral (01/16 0348) BP: 146/92 (01/16 0348) Pulse Rate: 105 (01/16 0348)  Labs: Recent Labs    05/17/23 1745 05/17/23 2042 05/18/23 0426 05/18/23 1335 05/19/23 0425  HGB 9.2*  --  7.3* 8.1* 7.9*  HCT 29.6*  --  23.6* 26.7* 26.6*  PLT 595*  --  453* 489* 472*  LABPROT 23.8*  --  21.8*  --  23.8*  INR 2.1*  --  1.9*  --  2.1*  CREATININE 2.70*  --  2.50*  --  2.27*  TROPONINIHS 141* 141*  --   --   --     Estimated Creatinine Clearance: 37.3 mL/min (A) (by C-G formula based on SCr of 2.27 mg/dL (H)).   Medical History: Past Medical History:  Diagnosis Date   Acute ischemic stroke (HCC) 01/01/2020   Acute kidney injury superimposed on CKD (HCC) 01/01/2020   Acute right MCA stroke (HCC) 01/28/2022   Anemia    CAD (coronary artery disease)    a. s/p cath in 03/2014 showing 30% mid-LAD, moderate to severe disease along small D1, patent LCx, moderate to severe distal OM2 stenosis and moderate diffuse diease along RCA not amenable to PCI   Cerebrovascular accident (CVA) (HCC) 12/31/2019   CHF (congestive heart failure) (HCC)    a. EF 55-60% in 12/2019 b. EF at 35-40% by echo in  05/2020   CKD (chronic kidney disease) stage 4, GFR 15-29 ml/min (HCC)    Diabetes mellitus without complication (HCC)    Myocardial infarction (HCC)    Neuropathy    PVD (peripheral vascular disease) (HCC)    Stroke (HCC) 01/2022   L hand weakness    Medications:  Medications Prior to Admission  Medication Sig Dispense Refill Last Dose/Taking   acetaminophen (TYLENOL) 325 MG tablet Take 1-2 tablets (325-650 mg total) by mouth every 4 (four) hours as needed for mild pain. (Patient taking differently: Take 650 mg by mouth as needed for mild pain (pain score 1-3).) 100 tablet 0 Past Month   [Paused] atorvastatin (LIPITOR) 40 MG tablet TAKE 1 TABLET BY MOUTH ONCE DAILY. 90 tablet 1 Past Month   clonazePAM (KLONOPIN) 1 MG disintegrating tablet Take 1 tablet (1 mg total) by mouth 2 (two) times daily as needed for seizure (For seizure lasting more than 2 minutes.). 60 tablet 0 Taking As Needed   clopidogrel (PLAVIX) 75 MG tablet Take 1 tablet (75 mg total) by mouth daily. 30 tablet 11 05/15/2023   furosemide (LASIX) 40 MG tablet TAKE 1 TABLET BY MOUTH ONCE DAILY AS NEEDED FOR SWELLING *PATIENT NEEDS APPOINTMENT* 15 tablet 10 Past Week   hydrALAZINE (APRESOLINE) 25 MG tablet Take 1 tablet (  25 mg total) by mouth in the morning and at bedtime.   05/15/2023   insulin aspart (NOVOLOG) 100 UNIT/ML injection Use with Omnipod for daily dose around 100 units daily (Patient taking differently: Inject 0-200 Units into the skin See admin instructions. Load Omnipod with 200 units every 3-4 days.) 80 mL 3 05/15/2023   isosorbide mononitrate (IMDUR) 60 MG 24 hr tablet TAKE TWO (2) TABLETS BY MOUTH EVERY DAY *NEW PRESCRIPTION REQUEST* 60 tablet 10 05/15/2023   levothyroxine (SYNTHROID) 125 MCG tablet Take 1 tablet (125 mcg total) by mouth daily at 6 (six) AM. 90 tablet 3 05/15/2023   metoprolol succinate (TOPROL-XL) 50 MG 24 hr tablet Take 1 tablet (50 mg total) by mouth daily. Take with or immediately following a meal.  90 tablet 0 05/15/2023   nitroGLYCERIN (NITROSTAT) 0.4 MG SL tablet Place 1 tablet (0.4 mg total) under the tongue every 5 (five) minutes as needed for chest pain. 100 tablet 3 Taking As Needed   oxyCODONE-acetaminophen (PERCOCET) 10-325 MG tablet Take 1 tablet by mouth every 4 (four) hours as needed for pain. 30 tablet 0 Past Week   pantoprazole (PROTONIX) 40 MG tablet Take 1 tablet (40 mg total) by mouth daily. Courtesy fill/ pt to get established with a primary MD for further refills (Patient taking differently: Take 40 mg by mouth daily as needed (heartburn).) 90 tablet 1 Past Month   ranolazine (RANEXA) 1000 MG SR tablet TAKE ONE (1) TABLET BY MOUTH TWICE DAILY *NEW PRESCRIPTION REQUEST* 60 tablet 10 05/15/2023   senna-docusate (SENOKOT-S) 8.6-50 MG tablet Take 1-2 tablets by mouth daily as needed for mild constipation. 30 tablet 0 Taking As Needed   sodium bicarbonate 650 MG tablet Take 1 tablet (650 mg total) by mouth 2 (two) times daily. 60 tablet 0 05/15/2023   tiZANidine (ZANAFLEX) 4 MG tablet TAKE 2 TABLETS BY MOUTH ONCE DAILY AT BEDTIME 30 tablet 10 05/15/2023   topiramate (TOPAMAX) 50 MG tablet Take 1 tablet (50 mg total) by mouth 2 (two) times daily. 60 tablet 3 05/15/2023   traZODone (DESYREL) 100 MG tablet TAKE 1 TABLET BY MOUTH AT BEDTIME *NEW PRESCRIPTION REQUEST* 30 tablet 10 05/15/2023   warfarin (COUMADIN) 5 MG tablet TAKE ONE TABLET BY MOUTH DAILY EXCEPT 1/2 TABLET ON WEDNESDAYS AND SATURDAYS OR AS DIRECTED (Patient taking differently: Take 2.5-5 mg by mouth daily. Take 5mg  (1 tablet) daily, except on Tuesday, Thursday, and Saturday's take 2.5mg  (1/2 tablet).) 40 tablet 5 05/15/2023   Continuous Glucose Sensor (DEXCOM G6 SENSOR) MISC CHANGE SENSOR EVERY 10 DAYS. 3 each 1    Continuous Glucose Transmitter (DEXCOM G6 TRANSMITTER) MISC Change transmitter every 90 days as directed. 1 each 3    Insulin Disposable Pump (OMNIPOD 5 DEXG7G6 PODS GEN 5) MISC Change pod every 48-72 hours. 100 each 0      Assessment: Pharmacy consulted to dose warfarin in patient with history of CVA. Home dose listed as 2.5 mg on Tue/Thu/Sat and 5 mg ROW with last dose listed as 1/12.  INR 2.1 > 1.9> 2.1  Goal of Therapy:  INR 2-3 Monitor platelets by anticoagulation protocol: Yes   Plan:  Warfarin 2.5 mg x 1 dose. Monitor daily INR and s/s of bleeding  Judeth Cornfield, PharmD Clinical Pharmacist 05/19/2023 10:15 AM

## 2023-05-19 NOTE — Progress Notes (Signed)
Since receiving IV ativan and pain medication has been resting well.

## 2023-05-19 NOTE — Progress Notes (Signed)
[facility] PROGRESS NOTE  Lauren [name] (DOB: January 14, 1981) ZOX:[id number] PCP: Billie Lade, MD Outpatient Specialists: Podiatry, Dr. Ninetta Lights  Brief Narrative: Lauren [name] is a 43 y.o. 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   She had recent admits 04/21/23- 04/24/23 for DKA and UTI and 03/2023 with seizure, DKA, and AKI.   Work up showed her to be afebrile with soft BP, with normocytic anemia and thrombocytosis, WBC normal at 8.2k. AKI noted (Cr 2.7 from baseline ~1.9). She had redness to the right foot, XR without significant osseous finding. She's had significant hypoglycemia and hyperglycemia thus far.   MRI of the right foot 1/15 suggested acute osteomyelitis for which IV antibiotics were started, blood cultures ordered. Case discussed with the patient's podiatrist who suggested formal evaluation as inpatient for possible debridement, so transfer to [facility] is being pursued.   Subjective: Pain in left arm is stable. She's not oriented, had tough night, feels anxious and wants to speak with family. She is amenable to having podiatry see her at [facility]. This, she is aware, will not be Dr. Lilian Kapur this week. No family at bedside. Called and spoke with her daughter, her legs have been swelling and weeping for the past week. .   Objective: BP (!) 146/92 (BP Location: Right Arm)   Pulse (!) 105   Temp 97.6 F (36.4 C) (Oral)   Resp 17   Ht 5\' 5"  (1.651 m)   Wt 94 kg   LMP 05/05/2023 (Exact Date)   SpO2 100%   BMI 34.49 kg/m   Chronically ill-appearing female in no acute distress Clear,  nonlabored RRR, no JVD Soft, NT, ND Neuro: Alert, labile mood, anxious appearing with L hemiparesis but no new focal deficits.  Ext: LUE tender to palpation in sling. Tender though also decreased sensation in R foot. No palpable fluctuance. As pictured with edema tracking up leg.    Assessment & Plan: Right foot cellulitis/myositis, acute osteomyelitis:   - Check blood cultures for completeness (albeit after abx started). No evidence of sepsis with no fever and normal WBC.  - Change to linezolid (hx MRSA but hoping to avoid renal impairment) and ceftriaxone (initially started cefepime with history of recent admissions, though no known hx pseudomonas), as discussed with ID Pharm. Would ideally get culture for tailoring abx going forward.  - MRI right foot showed cortical erosion and marrow edema consistent with acute osteomyelitis of the left great toe. Discussed with podiatry, Drs. Standiford and McDonald who will evaluate the patient formally on transfer to [facility], suspect she'd benefit from at least debridement and/or biopsy.   Poorly controlled T1DM: With hyper- and hypo- glycemia. On insulin pump, though may not be managing very well at home. Dx age 65.  - Based on insulin pump basal ~21u/day, ordered 15u glargine daily, continue 3u TIDWC + sensitive SSI. Glycemic control improving last 24 hours.     AKI on stage IV CKD: Due to dehydration. Oddly, renal U/S showed normal echogenicity, also no hydronephrosis. UA showed hyaline casts and 6-10 RBCs, no WBCs (on dirty catch specimen) - Will monitor renal parameters daily, stabilizing. Better than recent baseline.  - Restart bicarb  tabs for NAGMA  Acute metabolic encephalopathy: Likely attributable to infection, AKI, on chronic abnormality/history of CVA. D/w daughter, has chronically impaired short term memory. She is not oriented at baseline reliably.  - Given her significant anxiety, will trial alprazolam 0.25mg  prn.  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 Given her known CAD, could consider transfusion for hgb < 8g/dl.   History of CVA, carotid stenosis: Right hemisphere encephalomalacia noted on CT head with stent in right ICA.  - Continue coumadin, dosed thru pharmacy. INR 2.1 today.   Hypokalemia:  - Resolved with supplementation.   Chronic combined HFrEF, HTN: Echo within the past month showed LVEF 30-35%.  - Judicious IV fluids - Restart metoprolol, hydralazine, isosorbide now that BP is up. not on ACE/ARB/ARNI due to advanced CKD.    CAD with elevated troponin consistent with demand myocardial ischemia: Atypical chest pain resolved spontaneously, does not seem to represent ACS at this time.  - Continue plavix, statin (can continue now that LFTs are back to normal), beta blocker - Pt is on anticoagulation regardless, and would not be a candidate for cardiac catheterization per notes from last hospitalization.  - Note pt consistently refuses cardiac monitoring, discontinued.   Left humerus fracture:  - Remain in sling, nonoperative management based on fracture position and adverse risk profile. - Pain control with percocet.  - Orthopedics outpatient follow up, Dr. Romeo Apple recommended repeat XR in 2 weeks at time of office visit 1/9 (would be ~1/23).   Falls at home: Some left hemiparesis from CVA noted.  - PT/OT evaluations performed, pt would benefit from rehabilitation at SNF level of care.   Hypothyroidism: TSH 2.105.  - Continue synthroid.   GERD:  - PPI  Seizure disorder: No seizure activity noted thus far.  - Topamax was stopped at admission due to AMS, though we will restart to avoid provoking epileptic episode.  Obesity, class 1: Body mass index is 34.49 kg/m.   DNR: POA. Noted.   Tyrone Nine, MD [facility] [email or web address] 05/19/2023, 11:51 AM

## 2023-05-19 NOTE — Progress Notes (Signed)
Pt remained confused over night. No other acute events. Lauren [name]

## 2023-05-19 NOTE — Progress Notes (Signed)
Very confused all day and gets out of chair at least five times and hour.  Chair alarm set. Lower extremities very red and swollen, no drainage today.  Waiting on bed at [facility] .  Spoke with daughter on phone and she says the confusion is her baseline.  Received order for ativan po which was mostly ineffective and later order for ativan IV which was just given along with percocet for left arm  pain.  Asked to lie down in bed and this increased her arm pain so she wanted to sit in chair. Will not keep sling on.  Asks to have sling placed and when attempting to apply sling says she doesn't want it.  Husband was at bedside for a little while.

## 2023-05-19 NOTE — Progress Notes (Signed)
Mobility Specialist Progress Note:    05/19/23 1115  Mobility  Activity Stood at bedside;Ambulated with assistance to bathroom  Level of Assistance Standby assist, set-up cues, supervision of patient - no hands on  Assistive Device None  Distance Ambulated (ft) 25 ft  Range of Motion/Exercises Active;All extremities  LUE Weight Bearing Per Provider Order NWB  Activity Response Tolerated well  Mobility Referral Yes  Mobility visit 1 Mobility  Mobility Specialist Start Time (ACUTE ONLY) 1100  Mobility Specialist Stop Time (ACUTE ONLY) 1115  Mobility Specialist Time Calculation (min) (ACUTE ONLY) 15 min   Pt received requesting assistance to bathroom. Required SBA to stand and ambulate with no AD. Tolerated well, refused further mobility d/t fatigue. Returned to chair, alarm on and call bell in hand. All needs met.   Lawerance Bach Mobility Specialist Please contact via Special educational needs teacher or  Rehab office at [phone]

## 2023-05-20 ENCOUNTER — Telehealth: Payer: Self-pay | Admitting: Radiology

## 2023-05-20 DIAGNOSIS — N179 Acute kidney failure, unspecified: Secondary | ICD-10-CM | POA: Diagnosis not present

## 2023-05-20 DIAGNOSIS — N184 Chronic kidney disease, stage 4 (severe): Secondary | ICD-10-CM | POA: Diagnosis not present

## 2023-05-20 LAB — CBC
HCT: 25.1 % — ABNORMAL LOW (ref 36.0–46.0)
Hemoglobin: 7.6 g/dL — ABNORMAL LOW (ref 12.0–15.0)
MCH: 27.4 pg (ref 26.0–34.0)
MCHC: 30.3 g/dL (ref 30.0–36.0)
MCV: 90.6 fL (ref 80.0–100.0)
Platelets: 425 10*3/uL — ABNORMAL HIGH (ref 150–400)
RBC: 2.77 MIL/uL — ABNORMAL LOW (ref 3.87–5.11)
RDW: 17.1 % — ABNORMAL HIGH (ref 11.5–15.5)
WBC: 6.4 10*3/uL (ref 4.0–10.5)
nRBC: 0 % (ref 0.0–0.2)

## 2023-05-20 LAB — GLUCOSE, CAPILLARY
Glucose-Capillary: 315 mg/dL — ABNORMAL HIGH (ref 70–99)
Glucose-Capillary: 177 mg/dL — ABNORMAL HIGH (ref 70–99)
Glucose-Capillary: 60 mg/dL — ABNORMAL LOW (ref 70–99)
Glucose-Capillary: 75 mg/dL (ref 70–99)
Glucose-Capillary: 136 mg/dL — ABNORMAL HIGH (ref 70–99)

## 2023-05-20 LAB — BASIC METABOLIC PANEL
Anion gap: 9 (ref 5–15)
BUN: 27 mg/dL — ABNORMAL HIGH (ref 6–20)
CO2: 23 mmol/L (ref 22–32)
Calcium: 8 mg/dL — ABNORMAL LOW (ref 8.9–10.3)
Chloride: 105 mmol/L (ref 98–111)
Creatinine, Ser: 2.04 mg/dL — ABNORMAL HIGH (ref 0.44–1.00)
GFR, Estimated: 31 mL/min — ABNORMAL LOW (ref >60)
Glucose, Bld: 213 mg/dL — ABNORMAL HIGH (ref 70–99)
Potassium: 3.7 mmol/L (ref 3.5–5.1)
Sodium: 137 mmol/L (ref 135–145)

## 2023-05-20 LAB — PROTIME-INR
INR: 2.3 — ABNORMAL HIGH (ref 0.8–1.2)
Prothrombin Time: 25.9 s — ABNORMAL HIGH (ref 11.4–15.2)

## 2023-05-20 NOTE — Telephone Encounter (Signed)
Completed a prior auth for the Oxycodone on cover my meds

## 2023-05-20 NOTE — Progress Notes (Signed)
PHARMACY - ANTICOAGULATION CONSULT NOTE  Pharmacy Consult for warfarin Indication: CVA  Allergies  Allergen Reactions   Visipaque [Iodixanol] Nausea And Vomiting    Patient had vagal response during procedure became hypertensive, and bradycardic. CO2 used during procedure prior to contrast being used, this may be cause as well. Recommended premedicating prior to contrast being used in the future.    Wellbutrin [Bupropion] Hives   Gabapentin     dizzy   Cefepime Rash    Tolerates penicilllin   Ciprofloxacin Hcl Hives and Rash    Hives/rash at injection site; 01/15/22 tolerated IV cipro   Tape Rash    Patient Measurements: Height: 5\' 5"  (165.1 cm) Weight: 94 kg (207 lb 3.7 oz) IBW/kg (Calculated) : 57 Heparin Dosing Weight:   Vital Signs: Temp: 97.9 F (36.6 C) (01/17 0528) Temp Source: Oral (01/17 0528) BP: 121/78 (01/17 0528) Pulse Rate: 102 (01/17 0528)  Labs: Recent Labs    05/17/23 1745 05/17/23 2042 05/18/23 0426 05/18/23 1335 05/19/23 0425 05/20/23 0517  HGB 9.2*  --  7.3* 8.1* 7.9* 7.6*  HCT 29.6*  --  23.6* 26.7* 26.6* 25.1*  PLT 595*  --  453* 489* 472* 425*  LABPROT 23.8*  --  21.8*  --  23.8* 25.9*  INR 2.1*  --  1.9*  --  2.1* 2.3*  CREATININE 2.70*  --  2.50*  --  2.27* 2.04*  TROPONINIHS 141* 141*  --   --   --   --     Estimated Creatinine Clearance: 41.6 mL/min (A) (by C-G formula based on SCr of 2.04 mg/dL (H)).   Medical History: Past Medical History:  Diagnosis Date   Acute ischemic stroke (HCC) 01/01/2020   Acute kidney injury superimposed on CKD (HCC) 01/01/2020   Acute right MCA stroke (HCC) 01/28/2022   Anemia    CAD (coronary artery disease)    a. s/p cath in 03/2014 showing 30% mid-LAD, moderate to severe disease along small D1, patent LCx, moderate to severe distal OM2 stenosis and moderate diffuse diease along RCA not amenable to PCI   Cerebrovascular accident (CVA) (HCC) 12/31/2019   CHF (congestive heart failure) (HCC)    a.  EF 55-60% in 12/2019 b. EF at 35-40% by echo in 05/2020   CKD (chronic kidney disease) stage 4, GFR 15-29 ml/min (HCC)    Diabetes mellitus without complication (HCC)    Myocardial infarction (HCC)    Neuropathy    PVD (peripheral vascular disease) (HCC)    Stroke (HCC) 01/2022   L hand weakness   Assessment: Pharmacy consulted to dose warfarin in patient with history of CVA. Home dose listed as 2.5 mg on Tue/Thu/Sat and 5 mg ROW with last dose listed as 1/12.  INR continues to be at goal at 2.3, no bleeding issues noted. Will continue home dose of warfarin for now  Goal of Therapy:  INR 2-3 Monitor platelets by anticoagulation protocol: Yes   Plan:  Warfarin 5 mg x 1 dose. Monitor daily INR and s/s of bleeding  Sheppard Coil PharmD., BCPS Clinical Pharmacist 05/20/2023 8:34 AM

## 2023-05-20 NOTE — Plan of Care (Signed)
  Problem: Education: Goal: Ability to describe self-care measures that may prevent or decrease complications (Diabetes Survival Skills Education) will improve Outcome: Not Progressing Goal: Individualized Educational Video(s) Outcome: Not Progressing   

## 2023-05-20 NOTE — Progress Notes (Signed)
[facility] PROGRESS NOTE  Lauren [name] (DOB: August 21, 1980) ONG:[id number] PCP: Billie Lade, MD Outpatient Specialists: Podiatry, Dr. Ninetta Lights  Brief Narrative: Lauren [name] is a 43 y.o. 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   She had recent admits 04/21/23- 04/24/23 for DKA and UTI and 03/2023 with seizure, DKA, and AKI.   Work up showed her to be afebrile with soft BP, with normocytic anemia and thrombocytosis, WBC normal at 8.2k. AKI noted (Cr 2.7 from baseline ~1.9). She had redness to the right foot, XR without significant osseous finding. She's had significant hypoglycemia and hyperglycemia thus far.   MRI of the right foot 1/15 suggested acute osteomyelitis for which IV antibiotics were started, blood cultures ordered. Case discussed with the patient's podiatrist who suggested formal evaluation as inpatient for possible debridement, so transfer to [facility] is being pursued.   Subjective: Her anxiety is much improved with ativan. She reports controlled pain. She's oriented to time and place but does not know why she's in the hospital.   Objective: BP 121/78 (BP Location: Right Arm)   Pulse (!) 102   Temp 97.9 F (36.6 C) (Oral)   Resp 16   Ht 5\' 5"  (1.651 m)   Wt 94 kg   LMP 05/05/2023 (Exact Date)   SpO2 100%   BMI 34.49 kg/m   Gen: Chronically ill-appearing female appearing quite anxious Pulm: Clear, nonlabored  CV: RRR, no MRG GI: Soft, NT, ND, +BS  Neuro: Alert and incompletely oriented. No new focal deficits, stable l hemiparesis.  Skin: No other/new rashes, lesions or  ulcers on visualized skin   Ext: LUE stable, no sling on right now, reporting pain with any ROM. R foot remains stable as pictured below.   Assessment & Plan: Right foot cellulitis/myositis, acute osteomyelitis:   - Continue linezolid and ceftriaxone as discussed with ID Pharm.  - Monitor blood cultures (drawn 1/16 after abx started). Would ideally get culture for tailoring abx going forward.  - MRI right foot showed cortical erosion and marrow edema consistent with acute osteomyelitis of the left great toe. Discussed with podiatry, Drs. Standiford and McDonald who will evaluate the patient formally on transfer to [facility], suspect she'd benefit from at least debridement and/or biopsy.   Poorly controlled T1DM: With hyper- and hypo- glycemia. On insulin pump, though may not be managing very well at home. Dx age 87.  - Based on insulin pump basal ~21u/day, ordered 15u glargine daily, continue 3u TIDWC + sensitive SSI. Glycemic control improving, but labile based on somewhat erratic po intake.  AKI on stage IV CKD: Due to dehydration. Oddly, renal U/S showed normal echogenicity, also no hydronephrosis. UA showed hyaline casts and 6-10 RBCs, no WBCs (on dirty catch specimen) - Will monitor renal parameters daily, stabilizing. Better than recent baseline.  - Restarted bicarb tabs for NAGMA  Acute metabolic encephalopathy: Likely attributable to infection, AKI, on chronic abnormality/history of CVA. D/w daughter, has chronically impaired short term memory. She is not oriented at baseline reliably.  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 Given her known CAD, could consider transfusion for hgb < 8g/dl, particularly when surgery is planned, but no anginal symptoms, hypotension, or other symptoms  attributable to anemia so not pursuing this at this time.   History of CVA, carotid stenosis: Right hemisphere encephalomalacia noted on CT head with stent in right ICA.  - Continue coumadin, dosed by pharmacy. INR 2.3 today.   Hypokalemia:  - Resolved with supplementation.   Chronic combined HFrEF, HTN: Echo within the past month showed LVEF 30-35%.  - Continue metoprolol, hydralazine, isosorbide. Not on ACE/ARB/ARNI due to advanced CKD. Holding further IVF and diuretics  CAD with elevated troponin consistent with demand myocardial ischemia: Atypical chest pain resolved spontaneously, does not seem to represent ACS at this time.  - Continue plavix, statin (can continue now that LFTs are back to normal), beta blocker - Pt is on anticoagulation regardless, and would not be a candidate for cardiac catheterization per notes from last hospitalization.  - Note pt consistently refuses cardiac monitoring, discontinued.   Left humerus fracture:  - Remain in sling, nonoperative management based on fracture position and adverse risk profile. - Pain control with percocet.  - Orthopedics outpatient follow up, Dr. Romeo Apple recommended repeat XR in 2 weeks at time of office visit 1/9 (would be ~1/23).   Falls at home: Some left hemiparesis from CVA noted.  - PT/OT evaluations performed, pt would benefit from rehabilitation at SNF level of care.   Hypothyroidism: TSH 2.105.  - Continue synthroid.   GERD:  - PPI  Seizure disorder: No seizure activity noted thus far.  - Topamax was stopped at admission due to AMS, though we will restart to avoid provoking epileptic episode.  Obesity, class 1: Body mass index is 34.49 kg/m.   DNR: POA. Noted.   Tyrone Nine, MD [facility] [email or web address] 05/20/2023, 10:30 AM

## 2023-05-20 NOTE — Progress Notes (Signed)
Patient presented with severe anxiety and agitation, PRN ativan given and patient is now calmed down and sleeping.

## 2023-05-21 DIAGNOSIS — N184 Chronic kidney disease, stage 4 (severe): Secondary | ICD-10-CM | POA: Diagnosis not present

## 2023-05-21 DIAGNOSIS — N179 Acute kidney failure, unspecified: Secondary | ICD-10-CM | POA: Diagnosis not present

## 2023-05-21 LAB — BASIC METABOLIC PANEL
Anion gap: 9 (ref 5–15)
BUN: 25 mg/dL — ABNORMAL HIGH (ref 6–20)
CO2: 22 mmol/L (ref 22–32)
Calcium: 7.9 mg/dL — ABNORMAL LOW (ref 8.9–10.3)
Chloride: 108 mmol/L (ref 98–111)
Creatinine, Ser: 1.92 mg/dL — ABNORMAL HIGH (ref 0.44–1.00)
GFR, Estimated: 33 mL/min — ABNORMAL LOW (ref >60)
Glucose, Bld: 204 mg/dL — ABNORMAL HIGH (ref 70–99)
Potassium: 3.4 mmol/L — ABNORMAL LOW (ref 3.5–5.1)
Sodium: 139 mmol/L (ref 135–145)

## 2023-05-21 LAB — CBC
HCT: 23.9 % — ABNORMAL LOW (ref 36.0–46.0)
Hemoglobin: 7.1 g/dL — ABNORMAL LOW (ref 12.0–15.0)
MCH: 26.7 pg (ref 26.0–34.0)
MCHC: 29.7 g/dL — ABNORMAL LOW (ref 30.0–36.0)
MCV: 89.8 fL (ref 80.0–100.0)
Platelets: 377 10*3/uL (ref 150–400)
RBC: 2.66 MIL/uL — ABNORMAL LOW (ref 3.87–5.11)
RDW: 17.1 % — ABNORMAL HIGH (ref 11.5–15.5)
WBC: 6.9 10*3/uL (ref 4.0–10.5)
nRBC: 0 % (ref 0.0–0.2)

## 2023-05-21 LAB — GLUCOSE, CAPILLARY
Glucose-Capillary: 217 mg/dL — ABNORMAL HIGH (ref 70–99)
Glucose-Capillary: 136 mg/dL — ABNORMAL HIGH (ref 70–99)
Glucose-Capillary: 112 mg/dL — ABNORMAL HIGH (ref 70–99)
Glucose-Capillary: 252 mg/dL — ABNORMAL HIGH (ref 70–99)

## 2023-05-21 MED ORDER — INSULIN ASPART 100 UNIT/ML IJ SOLN
2.0000 [IU] | Freq: Three times a day (TID) | INTRAMUSCULAR | Status: DC
  Administered 2023-05-22 (×3): 2 [IU] via SUBCUTANEOUS

## 2023-05-21 MED ORDER — POTASSIUM CHLORIDE CRYS ER 20 MEQ PO TBCR
20.0000 meq | EXTENDED_RELEASE_TABLET | Freq: Once | ORAL | Status: AC
  Administered 2023-05-21: 20 meq via ORAL
  Filled 2023-05-21: qty 1

## 2023-05-21 NOTE — Progress Notes (Signed)
PHARMACY - ANTICOAGULATION CONSULT NOTE  Pharmacy Consult for warfarin Indication: CVA  Allergies  Allergen Reactions   Visipaque [Iodixanol] Nausea And Vomiting    Patient had vagal response during procedure became hypertensive, and bradycardic. CO2 used during procedure prior to contrast being used, this may be cause as well. Recommended premedicating prior to contrast being used in the future.    Wellbutrin [Bupropion] Hives   Gabapentin     dizzy   Cefepime Rash    Tolerates penicilllin   Ciprofloxacin Hcl Hives and Rash    Hives/rash at injection site; 01/15/22 tolerated IV cipro   Tape Rash    Patient Measurements: Height: 5\' 5"  (165.1 cm) Weight: 94 kg (207 lb 3.7 oz) IBW/kg (Calculated) : 57 Heparin Dosing Weight:   Vital Signs: Temp: 98.8 F (37.1 C) (01/18 0430) Temp Source: Oral (01/18 0430) BP: 127/75 (01/18 0430) Pulse Rate: 91 (01/18 0430)  Labs: Recent Labs    05/19/23 0425 05/20/23 0517 05/21/23 0449  HGB 7.9* 7.6* 7.1*  HCT 26.6* 25.1* 23.9*  PLT 472* 425* 377  LABPROT 23.8* 25.9*  --   INR 2.1* 2.3*  --   CREATININE 2.27* 2.04* 1.92*    Estimated Creatinine Clearance: 44.1 mL/min (A) (by C-G formula based on SCr of 1.92 mg/dL (H)).   Medical History: Past Medical History:  Diagnosis Date   Acute ischemic stroke (HCC) 01/01/2020   Acute kidney injury superimposed on CKD (HCC) 01/01/2020   Acute right MCA stroke (HCC) 01/28/2022   Anemia    CAD (coronary artery disease)    a. s/p cath in 03/2014 showing 30% mid-LAD, moderate to severe disease along small D1, patent LCx, moderate to severe distal OM2 stenosis and moderate diffuse diease along RCA not amenable to PCI   Cerebrovascular accident (CVA) (HCC) 12/31/2019   CHF (congestive heart failure) (HCC)    a. EF 55-60% in 12/2019 b. EF at 35-40% by echo in 05/2020   CKD (chronic kidney disease) stage 4, GFR 15-29 ml/min (HCC)    Diabetes mellitus without complication (HCC)    Myocardial  infarction (HCC)    Neuropathy    PVD (peripheral vascular disease) (HCC)    Stroke (HCC) 01/2022   L hand weakness   Assessment: Pharmacy consulted to dose warfarin in patient with history of CVA. Home dose listed as 2.5 mg on Tue/Thu/Sat and 5 mg ROW with last dose listed as 1/12.  INR at goal at 2.3 1/17, not checked this am, no bleeding issues noted. Hemoglobin down slightly to 7.1. Will continue home dose of warfarin for now and recheck INR in am.   Goal of Therapy:  INR 2-3 Monitor platelets by anticoagulation protocol: Yes   Plan:  Warfarin 2.5 mg x 1 dose. Monitor daily INR and s/s of bleeding  Sheppard Coil PharmD., BCPS Clinical Pharmacist 05/21/2023 10:21 AM

## 2023-05-21 NOTE — Progress Notes (Signed)
Report called to Quarry manager at [facility] [address]. EMS called per secretary to provide transportation.

## 2023-05-21 NOTE — Plan of Care (Signed)

## 2023-05-21 NOTE — Plan of Care (Signed)

## 2023-05-21 NOTE — Progress Notes (Signed)
[facility] PROGRESS NOTE  Lauren [name] (DOB: 07-29-1980) ACZ:[id number] PCP: Billie Lade, MD Outpatient Specialists: Podiatry, Dr. Ninetta Lights  Brief Narrative: Lauren [name] is a 43 y.o. 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   She had recent admits 04/21/23- 04/24/23 for DKA and UTI and 03/2023 with seizure, DKA, and AKI.   Work up showed her to be afebrile with soft BP, with normocytic anemia and thrombocytosis, WBC normal at 8.2k. AKI noted (Cr 2.7 from baseline ~1.9). She had redness to the right foot, XR without significant osseous finding. She's had significant hypoglycemia and hyperglycemia thus far.   MRI of the right foot 1/15 suggested acute osteomyelitis for which IV antibiotics were started, blood cultures ordered. Case discussed with the patient's podiatrist who suggested formal evaluation as inpatient for possible debridement, so transfer to [facility] is being pursued.   Subjective: Ambulates with fairly steady gait, says she can't feel her feet much and pain in her foot is mild. No bleeding per RN and pt. No overnight events. She's impulsive, difficult to redirect at times. Her anxiety has improved significantly since starting benzodiazepine. Called daughter again this AM, no answer.   Objective: BP 118/81 (BP Location: Right Wrist)   Pulse 75   Temp 98.1 F (36.7 C) (Oral)   Resp 20   Ht 5\' 5"  (1.651 m)   Wt 94 kg   LMP 05/05/2023 (Exact Date)   SpO2 100%   BMI 34.49 kg/m   Gen: Chronically ill-appearing female in no distress Pulm: Nonlabored, clear CV: Regular, rate in  70's, no MRG, Stable LE edema distally.  GI: +BS, just had BM, no rigidity, tenderness or guarding Neuro: Mild L hemiparesis vs. effort/pain from LUE fracture, alert and incompletely oriented, continues to have quite labile mood.  Skin: No other/new rashes, lesions or ulcers on visualized skin. Socks not doffed this morning. Ext: LUE with very edematous hand (new) with intact radial pulse, brisk cap refill and intact sensation. Has not been wearing sling regularly.    Assessment & Plan: Right foot cellulitis/myositis, acute osteomyelitis:   - Continue linezolid and ceftriaxone as discussed with ID Pharm.  - Monitor blood cultures (drawn 1/16 after abx started), NG2D. Would ideally get culture for tailoring abx going forward.  - MRI right foot showed cortical erosion and marrow edema consistent with acute osteomyelitis of the left great toe. Discussed with podiatry, Drs. Standiford and McDonald who will evaluate the patient formally on transfer to [facility], suspect she'd benefit from at least debridement and/or biopsy.   Poorly controlled T1DM: With hyper- and hypo- glycemia. On insulin pump, though may not be managing very well at home. Dx age 43.  - Based on insulin pump basal ~21u/day, ordered 15u glargine daily, decrease mealtime insulin to 2u TIDWC, continue sensitive SSI. Glycemic control improving, but labile based on somewhat erratic po intake.  AKI on stage IV CKD: Due to dehydration. Oddly, renal U/S showed normal echogenicity, also no hydronephrosis. UA showed hyaline casts and 6-10 RBCs, no WBCs (on dirty catch specimen) - Will monitor renal parameters daily, stabilizing. Continues to be better than recent baseline.  - Restarted bicarb tabs for NAGMA (resolved)  Acute metabolic encephalopathy: Likely attributable to infection, AKI, on chronic abnormality/history of CVA. D/w daughter, has chronically impaired short term memory. She is not oriented at baseline reliably.  - Continue prn ativan  (xanax was ineffective and no oversedation noted) - Continue trazodone to assist with day/night cycles  Normocytic anemia, suspect AOCD/of CKD:  - Hgb down to 7.1g/dl, no hypotension, anginal symptoms, orthostasis, etc. no bleeding noted. Would likely transfuse if hgb meets threshold of 7g/dl, says she's never had transfusion before. Due to AMS, called daughter today to discuss further (consider transfusion threshold of 8g/dl and 1u RBC today), no answer, will continue trying.    History of CVA, carotid stenosis: Right hemisphere encephalomalacia noted on CT head with stent in right ICA.  - Continue coumadin, dosed by pharmacy. INR therapeutic.  Hypokalemia:  - Supplement again today and monitor.  Chronic combined HFrEF, HTN: Echo within the past month showed LVEF 30-35%.  - Continue metoprolol, hydralazine, isosorbide. Not on ACE/ARB/ARNI due to advanced CKD.   CAD with elevated troponin consistent with demand myocardial ischemia: Atypical chest pain resolved spontaneously, does not seem to represent ACS at this time.  - Continue plavix, statin (can continue now that LFTs are back to normal), beta blocker - Pt is on anticoagulation regardless, and would not be a candidate for cardiac catheterization per notes from last hospitalization.  - Note pt consistently refuses cardiac monitoring, discontinued.   Left humerus fracture:  - Remain in sling, nonoperative management based on fracture position and adverse risk profile. - Pain control with percocet.  - Orthopedics outpatient follow up, Dr. Romeo Apple recommended repeat XR in 2 weeks at time of office visit 1/9 (would be ~1/23).  - R/o DVT with UE venous U/S (as tolerated), pt on anticoagulation and therapeutic INR regardless. Elevate extremity. D/w pt and RN.  Falls at home: Some left hemiparesis from CVA noted.  - PT/OT evaluations performed, pt would benefit from rehabilitation at SNF level of care.   Hypothyroidism: TSH 2.105.  -  Continue synthroid.   GERD:  - PPI  Seizure disorder: No seizure activity noted thus far.  - Topamax was stopped at admission due to AMS, though we will restart to avoid provoking epileptic episode.  Obesity, class 1: Body mass index is 34.49 kg/m.   DNR: POA. Noted.   Tyrone Nine, MD [facility] [email or web address] 05/21/2023, 2:11 PM

## 2023-05-22 ENCOUNTER — Encounter (HOSPITAL_COMMUNITY): Payer: Self-pay | Admitting: Anesthesiology

## 2023-05-22 DIAGNOSIS — N179 Acute kidney failure, unspecified: Secondary | ICD-10-CM | POA: Diagnosis not present

## 2023-05-22 DIAGNOSIS — S42202A Unspecified fracture of upper end of left humerus, initial encounter for closed fracture: Secondary | ICD-10-CM

## 2023-05-22 DIAGNOSIS — E66812 Obesity, class 2: Secondary | ICD-10-CM | POA: Diagnosis not present

## 2023-05-22 DIAGNOSIS — L03115 Cellulitis of right lower limb: Secondary | ICD-10-CM

## 2023-05-22 DIAGNOSIS — R0789 Other chest pain: Secondary | ICD-10-CM | POA: Diagnosis not present

## 2023-05-22 DIAGNOSIS — N184 Chronic kidney disease, stage 4 (severe): Secondary | ICD-10-CM | POA: Diagnosis not present

## 2023-05-22 DIAGNOSIS — E039 Hypothyroidism, unspecified: Secondary | ICD-10-CM | POA: Diagnosis not present

## 2023-05-22 DIAGNOSIS — M86171 Other acute osteomyelitis, right ankle and foot: Secondary | ICD-10-CM

## 2023-05-22 DIAGNOSIS — D75839 Thrombocytosis, unspecified: Secondary | ICD-10-CM | POA: Diagnosis not present

## 2023-05-22 DIAGNOSIS — I10 Essential (primary) hypertension: Secondary | ICD-10-CM | POA: Diagnosis not present

## 2023-05-22 DIAGNOSIS — E876 Hypokalemia: Secondary | ICD-10-CM | POA: Diagnosis not present

## 2023-05-22 DIAGNOSIS — E860 Dehydration: Secondary | ICD-10-CM | POA: Diagnosis not present

## 2023-05-22 LAB — CBC
HCT: 24.2 % — ABNORMAL LOW (ref 36.0–46.0)
HCT: 29.9 % — ABNORMAL LOW (ref 36.0–46.0)
Hemoglobin: 7.4 g/dL — ABNORMAL LOW (ref 12.0–15.0)
Hemoglobin: 9 g/dL — ABNORMAL LOW (ref 12.0–15.0)
MCH: 27.3 pg (ref 26.0–34.0)
MCH: 27.1 pg (ref 26.0–34.0)
MCHC: 30.6 g/dL (ref 30.0–36.0)
MCHC: 30.1 g/dL (ref 30.0–36.0)
MCV: 89.3 fL (ref 80.0–100.0)
MCV: 90.1 fL (ref 80.0–100.0)
Platelets: 369 10*3/uL (ref 150–400)
Platelets: 425 10*3/uL — ABNORMAL HIGH (ref 150–400)
RBC: 2.71 MIL/uL — ABNORMAL LOW (ref 3.87–5.11)
RBC: 3.32 MIL/uL — ABNORMAL LOW (ref 3.87–5.11)
RDW: 17.3 % — ABNORMAL HIGH (ref 11.5–15.5)
RDW: 17.2 % — ABNORMAL HIGH (ref 11.5–15.5)
WBC: 7.5 10*3/uL (ref 4.0–10.5)
WBC: 6.5 10*3/uL (ref 4.0–10.5)
nRBC: 0 % (ref 0.0–0.2)
nRBC: 0 % (ref 0.0–0.2)

## 2023-05-22 LAB — BASIC METABOLIC PANEL
Anion gap: 12 (ref 5–15)
BUN: 22 mg/dL — ABNORMAL HIGH (ref 6–20)
CO2: 18 mmol/L — ABNORMAL LOW (ref 22–32)
Calcium: 7.8 mg/dL — ABNORMAL LOW (ref 8.9–10.3)
Chloride: 106 mmol/L (ref 98–111)
Creatinine, Ser: 2.26 mg/dL — ABNORMAL HIGH (ref 0.44–1.00)
GFR, Estimated: 27 mL/min — ABNORMAL LOW (ref >60)
Glucose, Bld: 409 mg/dL — ABNORMAL HIGH (ref 70–99)
Potassium: 4.4 mmol/L (ref 3.5–5.1)
Sodium: 136 mmol/L (ref 135–145)

## 2023-05-22 LAB — PROTIME-INR
INR: 2.6 — ABNORMAL HIGH (ref 0.8–1.2)
INR: 2.4 — ABNORMAL HIGH (ref 0.8–1.2)
Prothrombin Time: 28.2 s — ABNORMAL HIGH (ref 11.4–15.2)
Prothrombin Time: 26.2 s — ABNORMAL HIGH (ref 11.4–15.2)

## 2023-05-22 LAB — BPAM RBC
Blood Product Expiration Date: 202502072359
Blood Product Expiration Date: 202502072359
Unit Type and Rh: 6200
Unit Type and Rh: 6200

## 2023-05-22 LAB — GLUCOSE, CAPILLARY
Glucose-Capillary: 431 mg/dL — ABNORMAL HIGH (ref 70–99)
Glucose-Capillary: 387 mg/dL — ABNORMAL HIGH (ref 70–99)
Glucose-Capillary: 312 mg/dL — ABNORMAL HIGH (ref 70–99)
Glucose-Capillary: 186 mg/dL — ABNORMAL HIGH (ref 70–99)

## 2023-05-22 LAB — TYPE AND SCREEN
ABO/RH(D): A POS
Antibody Screen: POSITIVE
Donor AG Type: NEGATIVE
Donor AG Type: NEGATIVE
Unit division: 0
Unit division: 0

## 2023-05-22 MED ORDER — OXYMETAZOLINE HCL 0.05 % NA SOLN: 1.0000 | NASAL | Status: AC | PRN

## 2023-05-22 MED ORDER — DIPHENHYDRAMINE HCL 25 MG PO CAPS: 25.0000 mg | ORAL_CAPSULE | Freq: Four times a day (QID) | ORAL | Status: DC | PRN

## 2023-05-22 NOTE — Progress Notes (Signed)
[facility] PROGRESS NOTE  Lauren [name] (DOB: 10/24/1980) WUJ:[id number] PCP: Lauren Lade, MD Outpatient Specialists: Podiatry, Dr. Ninetta [name]  Brief Narrative: Lauren [name] is a 43 y.o. 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   She had recent admits 04/21/23- 04/24/23 for DKA and UTI and 03/2023 with seizure, DKA, and AKI.   Work up showed her to be afebrile with soft BP, with normocytic anemia and thrombocytosis, WBC normal at 8.2k. AKI noted (Cr 2.7 from baseline ~1.9). She had redness to the right foot, XR without significant osseous finding. She's had significant hypoglycemia and hyperglycemia thus far.   MRI of the right foot 1/15 suggested acute osteomyelitis for which IV antibiotics were started, blood cultures ordered. Case discussed with the patient's podiatrist who suggested formal evaluation as inpatient for possible debridement, so transfer to [facility] is being pursued.   1/19: Patient was transferred from [name] for podiatry evaluation-currently pending.  Dr. Logan [name] was notified. Having mild nosebleed this morning.  INR 2.4, hemoglobin at 9  Subjective: Patient was complaining of mild nosebleed and right foot pain when seen today.  Objective: BP (!) 139/94 (BP Location: Right Arm)   Pulse (!) 101   Temp (!) 97.3 F (36.3 C)   Resp 18   Ht 5\' 5"  (1.651 m)   Wt 95.6 kg   LMP 05/05/2023 (Exact Date)   SpO2 100%   BMI 35.07 kg/m    General.  Obese lady, in no acute distress. Pulmonary.  Lungs clear bilaterally, normal respiratory effort. CV.  Regular rate and rhythm,  no JVD, rub or murmur. Abdomen.  Soft, nontender, nondistended, BS positive. CNS.  Alert and oriented .  No focal neurologic deficit. Extremities.  2+ bilateral LE edema with bilateral erythema right more than left.  Right foot erythema and edema.    Assessment & Plan: Right foot cellulitis/myositis, acute osteomyelitis:   - Continue linezolid and ceftriaxone as discussed with ID Pharm.  -Blood culture remain negative. - MRI right foot showed cortical erosion and marrow edema consistent with acute osteomyelitis of the left great toe. Discussed with podiatry, Drs. Lauren [name] and Lauren [name] who will evaluate the patient formally on transfer to [name], suspect she'd benefit from at least debridement and/or biopsy.   Poorly controlled T1DM: With hyper- and hypo- glycemia. On insulin pump, though may not be managing very well at home. Dx age 60.  - Based on insulin pump basal ~21u/day, ordered 15u glargine daily, decrease mealtime insulin to 2u TIDWC, continue sensitive SSI. Glycemic control improving, but labile based on somewhat erratic po intake.  AKI on stage IV CKD: Due to dehydration. Oddly, renal U/S showed normal echogenicity, also no hydronephrosis. UA showed hyaline casts and 6-10 RBCs, no WBCs (on dirty catch specimen) - Will monitor renal parameters daily, stabilizing. Continues to be better than recent baseline.  - Restarted bicarb tabs for NAGMA (resolved)  Acute metabolic encephalopathy: Likely attributable to infection, AKI, on chronic abnormality/history of CVA. D/w daughter, has chronically impaired short term memory. She is not oriented at baseline reliably.  - Continue prn ativan (xanax was ineffective and no oversedation noted) - Continue trazodone to assist with day/night cycles  Normocytic anemia, suspect AOCD/of CKD:  - Hgb down to 7.1g/dl, s/p 1 unit of PRBC and now hemoglobin at 9 today. -Monitor hemoglobin -Transfuse if below 8  History of CVA, carotid stenosis: Right  hemisphere encephalomalacia noted on CT head with stent in right ICA.  - Continue coumadin, dosed by pharmacy. INR therapeutic.  Hypokalemia:  -Continue to monitor and replete as needed  Chronic combined HFrEF, HTN: Echo within the past month showed LVEF 30-35%.  - Continue metoprolol, hydralazine, isosorbide. Not on ACE/ARB/ARNI due to advanced CKD.   CAD with elevated troponin consistent with demand myocardial ischemia: Atypical chest pain resolved spontaneously, does not seem to represent ACS at this time.  - Continue plavix, statin (can continue now that LFTs are back to normal), beta blocker - Pt is on anticoagulation regardless, and would not be a candidate for cardiac catheterization per notes from last hospitalization.  - Note pt consistently refuses cardiac monitoring, discontinued.   Left humerus fracture:  - Remain in sling, nonoperative management based on fracture position and adverse risk profile. - Pain control with percocet.  - Orthopedics outpatient follow up, Dr. Romeo Apple recommended repeat XR in 2 weeks at time of office visit 1/9 (would be ~1/23).  - R/o DVT with UE venous U/S (as tolerated), pt on anticoagulation and therapeutic INR regardless. Elevate extremity. D/w pt and RN.  Falls at home: Some left hemiparesis from CVA noted.  - PT/OT evaluations performed, pt would benefit from rehabilitation at SNF level of care.   Hypothyroidism: TSH 2.105.  - Continue synthroid.   GERD:  - PPI  Seizure disorder: No seizure activity noted thus far.  - Topamax was stopped at admission due to AMS, though we will restart to avoid provoking epileptic episode.  Obesity, class 1: Body mass index is 35.07 kg/m.   DNR: POA. Noted.   Talked with daughter on phone.  [name], MD [facility] [email or web address] 05/22/2023, 3:03 PM

## 2023-05-22 NOTE — Progress Notes (Signed)
Mobility Specialist Progress Note:    05/22/23 0915  Mobility  Activity Ambulated with assistance in room  Level of Assistance Minimal assist, patient does 75% or more  Assistive Device Other (Comment) (HHA)  Distance Ambulated (ft) 25 ft  Activity Response Tolerated well  Mobility Referral Yes  Mobility visit 1 Mobility  Mobility Specialist Start Time (ACUTE ONLY) 0850  Mobility Specialist Stop Time (ACUTE ONLY) 0901  Mobility Specialist Time Calculation (min) (ACUTE ONLY) 11 min   Pt received in bed, agreeable to transfer B>C. Required MinA to stand via HHA. Tolerated well, confused at times, pt asking her mother how she slept when nobody else is in room. C/o "legs and arms burning." Required redirecting when attempting to take off gown. Pt sitting comfortably in chair with call bell in reach, fall mat placed. All needs met.   Feliciana Rossetti Mobility Specialist Please contact via Special educational needs teacher or  Rehab office at [phone]

## 2023-05-22 NOTE — Consult Note (Signed)
PODIATRY CONSULTATION  NAME Pascha Parmer MRN [id number] DOB 04-27-1981 DOA 05/17/2023   Reason for consult: Possible osteomyelitis right foot Chief Complaint  Patient presents with   Chest Pain    History of present illness: 43 y.o. 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  MRI of the right foot 05/17/2022 suggesting possible acute osteomyelitis.  IV antibiotics started and podiatry consulted.  Patient was transferred on 05/21/2022 from [facility].  Past Medical History:  Diagnosis Date   Acute ischemic stroke (HCC) 01/01/2020   Acute kidney injury superimposed on CKD (HCC) 01/01/2020   Acute right MCA stroke (HCC) 01/28/2022   Anemia    CAD (coronary artery disease)    a. s/p cath in 03/2014 showing 30% mid-LAD, moderate to severe disease along small D1, patent LCx, moderate to severe distal OM2 stenosis and moderate diffuse diease along RCA not amenable to PCI   Cerebrovascular accident (CVA) (HCC) 12/31/2019   CHF (congestive heart failure) (HCC)    a. EF 55-60% in 12/2019 b. EF at 35-40% by echo in 05/2020   CKD (chronic kidney disease) stage 4, GFR 15-29 ml/min (HCC)    Diabetes mellitus without complication (HCC)    Myocardial infarction (HCC)    Neuropathy    PVD (peripheral vascular disease) (HCC)    Stroke (HCC) 01/2022   L hand weakness       Latest Ref Rng & Units 05/22/2023   10:42 AM 05/21/2023    4:49 AM 05/20/2023    5:17 AM  CBC  WBC 4.0 - 10.5 K/uL 6.5  6.9  6.4   Hemoglobin 12.0 - 15.0 g/dL 9.0  7.1  7.6   Hematocrit 36.0 - 46.0 % 29.9  23.9  25.1   Platelets 150 - 400 K/uL 425  377  425        Latest Ref Rng & Units 05/21/2023    4:49 AM 05/20/2023    5:17 AM 05/19/2023    4:25 AM  BMP  Glucose 70 - 99 mg/dL  638  756  433   BUN 6 - 20 mg/dL 25  27  31    Creatinine 0.44 - 1.00 mg/dL 2.95  1.88  4.16   Sodium 135 - 145 mmol/L 139  137  136   Potassium 3.5 - 5.1 mmol/L 3.4  3.7  3.7   Chloride 98 - 111 mmol/L 108  105  106   CO2 22 - 32 mmol/L 22  23  21    Calcium 8.9 - 10.3 mg/dL 7.9  8.0  7.9         Physical Exam: General: The patient is alert and oriented x3 in no acute distress.   Dermatology: Very superficial stable wounds noted to the right foot with overlying crust.  No drainage.  No malodor.  Vascular: Chronic edema right foot.  Skin is cool to touch.  Concern for vascular compromise.  Neurological: Light touch and protective threshold diminished bilaterally.   Musculoskeletal Exam: No structural deformity noted.  VAS [name] ABI WITH/WO TBI 11/30/2022 ABI Findings:  +---------+------------------+-----+----------+--------+  Right   Rt Pressure (mmHg)IndexWaveform  Comment   +---------+------------------+-----+----------+--------+  Brachial 162                                        +---------+------------------+-----+----------+--------+  PTA     254               1.57 monophasic          +---------+------------------+-----+----------+--------+  DP      254               1.57 monophasic          +---------+------------------+-----+----------+--------+  Great Toe90                0.56 Abnormal            +---------+------------------+-----+----------+--------+   +---------+------------------+-----+----------+-------+  Left    Lt Pressure (mmHg)IndexWaveform  Comment  +---------+------------------+-----+----------+-------+  Brachial                                  Edema    +---------+------------------+-----+----------+-------+  PTA     174               1.07 monophasic         +---------+------------------+-----+----------+-------+  DP      158               0.98 monophasic          +---------+------------------+-----+----------+-------+  Great Toe89                0.55 Abnormal           +---------+------------------+-----+----------+-------+   +-------+-----------+-----------+------------+------------+  ABI/TBIToday's ABIToday's TBIPrevious ABIPrevious TBI  +-------+-----------+-----------+------------+------------+  Right [place]         0.56       0.94        0.67          +-------+-----------+-----------+------------+------------+  Left  [place]         0.55       0.88        NA            +-------+-----------+-----------+------------+------------+  Summary:  Right: Resting right ankle-brachial index indicates noncompressible right lower extremity arteries. The right toe-brachial index is abnormal.  Left: Although resting left ankle-brachial index appears to be within normal range, probably falsley elevated due to calcified arteries. The left toe-brachial index is abnormal.   MR FOOT RIGHT W WO CONTRAST 05/18/2023 IMPRESSION: 1. There is a defect/ulcer at the distal dorsal aspect of the great toe. There is cortical erosion at the distal dorsal aspect of the adjacent distal phalanx, and marrow edema throughout the distal 50% of the distal phalanx of the great toe. This is consistent with acute osteomyelitis. 2. Possible minimal increased STIR signal within the distal aspect of the distal phalanx of the fifth toe. Due to the small size and slice selection, it is unclear whether this is replicated on the other sequences. Recommend clinical correlation for possible soft tissue infection within the fifth toe that would raise suspicion for acute osteomyelitis. 3. Moderate diffuse subcutaneous fat edema and swelling, greatest within the medial and lateral ankle and dorsal midfoot to forefoot. This is nonspecific but can be seen with cellulitis. 4. Diffuse nonspecific edema/myositis within the midfoot and forefoot musculature, greatest within the  abductor hallucis and flexor digitorum brevis.  ASSESSMENT/PLAN OF CARE Superficial stable ulcers of the right foot Edema right foot with concern for limb ischemia  -Patient seen at bedside today -Patient has not had follow-up with vascular since she was last seen on 11/30/2022.  As per vascular note from 11/30/2022 regarding the lower extremities it appears  that she was mostly evaluated for left lower extremity wound and complications:  "Pt has hx of CO2 angiogram with left ATA, DP and SFA angioplasty and stenting of left SFA on 06/10/2022 by Dr. Chestine Spore for CLI of LLE with tissue loss.  She was noted to have significant small vessel disease." -ABIs ordered -Recommend vascular consult.  Given the patient's condition and concern for ischemia now to the right lower extremity recommend evaluation by vascular -Patient is tentatively scheduled for bone biopsy tomorrow 05/23/2023 with Dr. Annamary Rummage within our group to rule out osteomyelitis based on MRI findings.  Will defer to him -Continue linezolid and ceftriaxone -Will follow    Thank you for the consult.  Please contact me directly via secure chat with any questions or concerns.     Felecia Shelling, DPM [facility]  Dr. Felecia Shelling, DPM    [address] [place], [place] [address]                Office [phone]  Fax [phone]

## 2023-05-22 NOTE — Plan of Care (Signed)
  Problem: Fluid Volume: Goal: Ability to maintain a balanced intake and output will improve Outcome: Progressing   Problem: Metabolic: Goal: Ability to maintain appropriate glucose levels will improve Outcome: Not Progressing   Problem: Activity: Goal: Risk for activity intolerance will decrease Outcome: Progressing   Problem: Nutrition: Goal: Adequate nutrition will be maintained Outcome: Progressing

## 2023-05-22 NOTE — Progress Notes (Addendum)
PHARMACY - ANTICOAGULATION CONSULT NOTE  Pharmacy Consult for warfarin Indication: CVA  Allergies  Allergen Reactions   Visipaque [Iodixanol] Nausea And Vomiting    Patient had vagal response during procedure became hypertensive, and bradycardic. CO2 used during procedure prior to contrast being used, this may be cause as well. Recommended premedicating prior to contrast being used in the future.    Wellbutrin [Bupropion] Hives   Gabapentin     dizzy   Ciprofloxacin Hcl Hives and Rash    Hives/rash at injection site; 01/15/22 tolerated IV cipro   Tape Rash    Patient Measurements: Height: 5\' 5"  (165.1 cm) Weight: 95.6 kg (210 lb 12.2 oz) IBW/kg (Calculated) : 57 Heparin Dosing Weight:   Vital Signs: Temp: 97.3 F (36.3 C) (01/19 0932) BP: 139/94 (01/19 0932) Pulse Rate: 101 (01/19 0932)  Labs: Recent Labs    05/20/23 0517 05/21/23 0449 05/22/23 1042  HGB 7.6* 7.1* 9.0*  HCT 25.1* 23.9* 29.9*  PLT 425* 377 425*  LABPROT 25.9*  --  26.2*  INR 2.3*  --  2.4*  CREATININE 2.04* 1.92*  --     Estimated Creatinine Clearance: 44.5 mL/min (A) (by C-G formula based on SCr of 1.92 mg/dL (H)).   Medical History: Past Medical History:  Diagnosis Date   Acute ischemic stroke (HCC) 01/01/2020   Acute kidney injury superimposed on CKD (HCC) 01/01/2020   Acute right MCA stroke (HCC) 01/28/2022   Anemia    CAD (coronary artery disease)    a. s/p cath in 03/2014 showing 30% mid-LAD, moderate to severe disease along small D1, patent LCx, moderate to severe distal OM2 stenosis and moderate diffuse diease along RCA not amenable to PCI   Cerebrovascular accident (CVA) (HCC) 12/31/2019   CHF (congestive heart failure) (HCC)    a. EF 55-60% in 12/2019 b. EF at 35-40% by echo in 05/2020   CKD (chronic kidney disease) stage 4, GFR 15-29 ml/min (HCC)    Diabetes mellitus without complication (HCC)    Myocardial infarction (HCC)    Neuropathy    PVD (peripheral vascular disease) (HCC)     Stroke (HCC) 01/2022   L hand weakness   Assessment: Pharmacy consulted to dose warfarin in patient with history of CVA. Home dose listed as 2.5 mg on Tue/Thu/Sat and 5 mg ROW with last dose listed as 1/12.  Morning INR 2.4, and Hgb 9.0. Will continue home dose of warfarin and check INR tomorrow.  Goal of Therapy:  INR 2-3 Monitor platelets by anticoagulation protocol: Yes   Plan:  Continue home warfarin regimen (2.5 mg MWFSu, 5 mg all other days) Warfarin 5 mg dose today Monitor daily INR and s/s of bleeding  Lora Paula, PharmD PGY-2 Infectious Diseases Pharmacy Resident [facility] 05/22/2023 11:33 AM

## 2023-05-23 ENCOUNTER — Ambulatory Visit: Payer: BC Managed Care – PPO | Admitting: Internal Medicine

## 2023-05-23 ENCOUNTER — Encounter (HOSPITAL_COMMUNITY)
Admission: EM | Discharge status: Against Medical Advice | Payer: Self-pay | Source: Home / Self Care | Attending: Internal Medicine

## 2023-05-23 ENCOUNTER — Inpatient Hospital Stay (HOSPITAL_COMMUNITY): Admission: RE | Admit: 2023-05-23 | Payer: 59 | Source: Home / Self Care | Admitting: Podiatry

## 2023-05-23 ENCOUNTER — Inpatient Hospital Stay (HOSPITAL_COMMUNITY): Payer: 59

## 2023-05-23 ENCOUNTER — Encounter (HOSPITAL_COMMUNITY): Payer: Self-pay | Admitting: Anesthesiology

## 2023-05-23 DIAGNOSIS — R609 Edema, unspecified: Secondary | ICD-10-CM | POA: Diagnosis not present

## 2023-05-23 DIAGNOSIS — S42202A Unspecified fracture of upper end of left humerus, initial encounter for closed fracture: Secondary | ICD-10-CM | POA: Diagnosis not present

## 2023-05-23 DIAGNOSIS — I70234 Atherosclerosis of native arteries of right leg with ulceration of heel and midfoot: Secondary | ICD-10-CM | POA: Diagnosis not present

## 2023-05-23 DIAGNOSIS — I7090 Unspecified atherosclerosis: Secondary | ICD-10-CM | POA: Diagnosis not present

## 2023-05-23 DIAGNOSIS — E860 Dehydration: Secondary | ICD-10-CM | POA: Diagnosis not present

## 2023-05-23 DIAGNOSIS — D75839 Thrombocytosis, unspecified: Secondary | ICD-10-CM | POA: Diagnosis not present

## 2023-05-23 DIAGNOSIS — E66812 Obesity, class 2: Secondary | ICD-10-CM | POA: Diagnosis not present

## 2023-05-23 DIAGNOSIS — N179 Acute kidney failure, unspecified: Secondary | ICD-10-CM | POA: Diagnosis not present

## 2023-05-23 DIAGNOSIS — R0789 Other chest pain: Secondary | ICD-10-CM | POA: Diagnosis not present

## 2023-05-23 DIAGNOSIS — E039 Hypothyroidism, unspecified: Secondary | ICD-10-CM | POA: Diagnosis not present

## 2023-05-23 DIAGNOSIS — E876 Hypokalemia: Secondary | ICD-10-CM | POA: Diagnosis not present

## 2023-05-23 DIAGNOSIS — N184 Chronic kidney disease, stage 4 (severe): Secondary | ICD-10-CM | POA: Diagnosis not present

## 2023-05-23 DIAGNOSIS — I10 Essential (primary) hypertension: Secondary | ICD-10-CM | POA: Diagnosis not present

## 2023-05-23 LAB — GLUCOSE, CAPILLARY
Glucose-Capillary: 308 mg/dL — ABNORMAL HIGH (ref 70–99)
Glucose-Capillary: 224 mg/dL — ABNORMAL HIGH (ref 70–99)
Glucose-Capillary: 84 mg/dL (ref 70–99)
Glucose-Capillary: 85 mg/dL (ref 70–99)
Glucose-Capillary: 109 mg/dL — ABNORMAL HIGH (ref 70–99)

## 2023-05-23 LAB — BASIC METABOLIC PANEL
Anion gap: 12 (ref 5–15)
BUN: 23 mg/dL — ABNORMAL HIGH (ref 6–20)
CO2: 20 mmol/L — ABNORMAL LOW (ref 22–32)
Calcium: 8.1 mg/dL — ABNORMAL LOW (ref 8.9–10.3)
Chloride: 107 mmol/L (ref 98–111)
Creatinine, Ser: 2.44 mg/dL — ABNORMAL HIGH (ref 0.44–1.00)
GFR, Estimated: 25 mL/min — ABNORMAL LOW (ref >60)
Glucose, Bld: 268 mg/dL — ABNORMAL HIGH (ref 70–99)
Potassium: 4 mmol/L (ref 3.5–5.1)
Sodium: 139 mmol/L (ref 135–145)

## 2023-05-23 LAB — CBC
HCT: 24.1 % — ABNORMAL LOW (ref 36.0–46.0)
Hemoglobin: 7.4 g/dL — ABNORMAL LOW (ref 12.0–15.0)
MCH: 27.3 pg (ref 26.0–34.0)
MCHC: 30.7 g/dL (ref 30.0–36.0)
MCV: 88.9 fL (ref 80.0–100.0)
Platelets: 330 10*3/uL (ref 150–400)
RBC: 2.71 MIL/uL — ABNORMAL LOW (ref 3.87–5.11)
RDW: 17.1 % — ABNORMAL HIGH (ref 11.5–15.5)
WBC: 7.7 10*3/uL (ref 4.0–10.5)
nRBC: 0 % (ref 0.0–0.2)

## 2023-05-23 LAB — HEMOGLOBIN AND HEMATOCRIT, BLOOD
HCT: 26.1 % — ABNORMAL LOW (ref 36.0–46.0)
Hemoglobin: 7.8 g/dL — ABNORMAL LOW (ref 12.0–15.0)

## 2023-05-23 LAB — IRON AND TIBC
Iron: 18 ug/dL — ABNORMAL LOW (ref 28–170)
Saturation Ratios: 6 % — ABNORMAL LOW (ref 10.4–31.8)
TIBC: 290 ug/dL (ref 250–450)
UIBC: 272 ug/dL

## 2023-05-23 LAB — RETICULOCYTES
Immature Retic Fract: 10.2 % (ref 2.3–15.9)
RBC.: 2.91 MIL/uL — ABNORMAL LOW (ref 3.87–5.11)
Retic Count, Absolute: 88 10*3/uL (ref 19.0–186.0)
Retic Ct Pct: 3 % (ref 0.4–3.1)

## 2023-05-23 LAB — PROTIME-INR
INR: 2.9 — ABNORMAL HIGH (ref 0.8–1.2)
Prothrombin Time: 30.5 s — ABNORMAL HIGH (ref 11.4–15.2)

## 2023-05-23 LAB — FERRITIN: Ferritin: 17 ng/mL (ref 11–307)

## 2023-05-23 LAB — FOLATE: Folate: 7.9 ng/mL (ref >5.9)

## 2023-05-23 LAB — VITAMIN B12: Vitamin B-12: 930 pg/mL — ABNORMAL HIGH (ref 180–914)

## 2023-05-23 SURGERY — BIOPSY, BONE
Anesthesia: General | Laterality: Right

## 2023-05-23 MED ORDER — WARFARIN SODIUM 2.5 MG PO TABS: 2.5000 mg | ORAL_TABLET | ORAL | Status: DC

## 2023-05-23 MED ORDER — WARFARIN SODIUM 5 MG PO TABS: 5.0000 mg | ORAL_TABLET | ORAL | Status: DC

## 2023-05-23 MED ORDER — INSULIN GLARGINE-YFGN 100 UNIT/ML ~~LOC~~ SOLN
10.0000 [IU] | Freq: Two times a day (BID) | SUBCUTANEOUS | Status: DC
  Administered 2023-05-23 – 2023-05-27 (×8): 10 [IU] via SUBCUTANEOUS
  Filled 2023-05-23 (×13): qty 0.1

## 2023-05-23 MED ORDER — INSULIN ASPART 100 UNIT/ML IJ SOLN
5.0000 [IU] | Freq: Three times a day (TID) | INTRAMUSCULAR | Status: DC
  Administered 2023-05-23 – 2023-05-28 (×8): 5 [IU] via SUBCUTANEOUS

## 2023-05-23 MED ORDER — WARFARIN SODIUM 1 MG PO TABS: 1.0000 mg | ORAL_TABLET | Freq: Once | ORAL | Status: DC

## 2023-05-23 MED ORDER — WARFARIN - PHARMACIST DOSING INPATIENT: Freq: Every day | Status: DC

## 2023-05-23 MED ORDER — EPOETIN ALFA-EPBX 10000 UNIT/ML IJ SOLN
10000.0000 [IU] | Freq: Once | INTRAMUSCULAR | Status: AC
  Administered 2023-05-23: 10000 [IU] via SUBCUTANEOUS
  Filled 2023-05-23 (×2): qty 1

## 2023-05-23 MED ORDER — SODIUM CHLORIDE 0.9 % IV SOLN
200.0000 mg | Freq: Once | INTRAVENOUS | Status: AC
  Administered 2023-05-23: 200 mg via INTRAVENOUS
  Filled 2023-05-23 (×2): qty 10

## 2023-05-23 MED ORDER — PHYTONADIONE 5 MG PO TABS
2.5000 mg | ORAL_TABLET | Freq: Once | ORAL | Status: AC
  Administered 2023-05-23: 2.5 mg via ORAL
  Filled 2023-05-23: qty 1

## 2023-05-23 MED ORDER — IRON SUCROSE 200 MG IVPB - SIMPLE MED
200.0000 mg | Freq: Once | Status: DC
  Filled 2023-05-23: qty 110

## 2023-05-23 NOTE — Consult Note (Addendum)
Hospital Consult    Reason for Consult:  right foot wound Requesting Physician:  Lauren [name] MRN #:  [id number]  History of Present Illness: This is a 43 y.o. female who presented to the hospital with dull chest pain.  She has hx of CHF and came to be evaluated.  She recently had a left proximal humerus fx s/p fall on 12/223/2024.  She had recent admits 04/21/23- 04/24/23 for DKA and UTI and 03/2023 with seizure, DKA, and AKI.   Pt was seen by Podiatry and MRI on 05/18/2023 with concerns for possible osteomyelitis.  She has been started on IV abx and Vascular Surgery is consulted for further evaluation.  She states that she has had the wounds on her feet since her last admission due to swelling "since y'all pumped all that fluid in me".  She states she feels the sores are getting worse.  She continues to have swelling in her feet.  She states she feels there is more redness.  She does not recall having claudication prior to developing her sores. She states her feet hurt because they are swollen.  She states that she takes a toke of a cigarette every now and then but not every day.  She does have hx of CHF and CKD with a creatinine of 2.44 today.  Her GFR is 25.  She is on coumadin and her INR today is 2.9.  She is known to our office with hx of CO2 angiogram with left ATA, DP and SFA angioplasty and stenting of left SFA on 06/10/2022 by Dr. Chestine Spore for CLI of LLE with tissue loss.  She was noted to have significant small vessel disease.  She underwent excision of left 5th metatarsal with debridement of ulcer and 4th ray amputation for osteomyelitis by Dr. Ardelle Anton.    She has hx of acute ischemic RUE and underwent right brachial, radial, and ulnar thrombectomy on 11/18/2021 by Dr. Chestine Spore.  Cardio embolic work up did not reveal source and she was referred to hem-onc for further work up.   Per CTA head, she has stent of right ICA in the petrous segment.    She was seen in our office on 11/30/2022 and was doing  well.  She had healed her wounds and she was not having claudication or rest pain.  She was compliant with her plavix and statin.  She had not smoked since her stroke. At that time, she had a great toe pressure on the right of 90.  Her vessels were [place].  She had brisk doppler flow in both feet.  She is scheduled for f/u in our office next week.   The pt is on a statin for cholesterol management.  The pt is not on a daily aspirin.   Other AC:  Plavix/coumadin The pt is on BB, diuretic, hydralazine for hypertension.   The pt is on medication for diabetes PTA. Tobacco hx:  former  Past Medical History:  Diagnosis Date   Acute ischemic stroke (HCC) 01/01/2020   Acute kidney injury superimposed on CKD (HCC) 01/01/2020   Acute right MCA stroke (HCC) 01/28/2022   Anemia    CAD (coronary artery disease)    a. s/p cath in 03/2014 showing 30% mid-LAD, moderate to severe disease along small D1, patent LCx, moderate to severe distal OM2 stenosis and moderate diffuse diease along RCA not amenable to PCI   Cerebrovascular accident (CVA) (HCC) 12/31/2019   CHF (congestive heart failure) (HCC)    a. EF 55-60% in 12/2019  b. EF at 35-40% by echo in 05/2020   CKD (chronic kidney disease) stage 4, GFR 15-29 ml/min (HCC)    Diabetes mellitus without complication (HCC)    Myocardial infarction (HCC)    Neuropathy    PVD (peripheral vascular disease) (HCC)    Stroke (HCC) 01/2022   L hand weakness    Past Surgical History:  Procedure Laterality Date   ABDOMINAL AORTOGRAM W/LOWER EXTREMITY N/A 06/10/2022   Procedure: ABDOMINAL AORTOGRAM W/LOWER EXTREMITY;  Surgeon: Cephus Shelling, MD;  Location: [facility]AS[facility];  Service: Cardiovascular;  Laterality: N/A;   AMPUTATION Left 09/02/2021   Procedure: AMPUTATION RAY;  Surgeon: Edwin Cap, DPM;  Location: [facility] OR;  Service: Podiatry;  Laterality: Left;  sagittal saw, 3L bag saline & Pulse   Cardiac catherization     CHOLECYSTECTOMY     I & D  EXTREMITY Left 08/30/2021   Procedure: IRRIGATION AND DEBRIDEMENT WITH BONE BIOPSY;  Surgeon: Candelaria Stagers, DPM;  Location: [facility] OR;  Service: Podiatry;  Laterality: Left;   IR ANGIO VERTEBRAL SEL SUBCLAVIAN INNOMINATE UNI L MOD SED  01/18/2022   IR CT HEAD LTD  01/08/2022   IR INTRA CRAN STENT  01/08/2022   IR PERCUTANEOUS ART THROMBECTOMY/INFUSION INTRACRANIAL INC DIAG ANGIO  01/08/2022   IRRIGATION AND DEBRIDEMENT FOOT Left 09/04/2021   Procedure: IRRIGATION AND DEBRIDEMENT FOOT AND CLOSURE;  Surgeon: Edwin Cap, DPM;  Location: [facility];  Service: Podiatry;  Laterality: Left;   LEFT HEART CATH AND CORONARY ANGIOGRAPHY N/A 07/26/2022   Procedure: LEFT HEART CATH AND CORONARY ANGIOGRAPHY;  Surgeon: [place], Peter M, MD;  [facility] INVASIVE CV LAB;  Service: Cardiovascular;  Laterality: N/A;   METATARSAL OSTEOTOMY Left 07/21/2022   Procedure: LEFT FOOT EXCISION OF FIFTH METATARSAL WITH DEBRIDEMENT OF ULCER, POSSIBLE BONE BIOPSY OF THE FOURTH METATARSAL, POSSIBLE FOURTH RAY AMPUTATION OF LEFT TOEAND METATARSAL;  Surgeon: Vivi Barrack, DPM;  Location: [facility];  Service: Podiatry;  Laterality: Left;   PERIPHERAL VASCULAR BALLOON ANGIOPLASTY Left 06/10/2022   Procedure: PERIPHERAL VASCULAR BALLOON ANGIOPLASTY;  Surgeon: Cephus Shelling, MD;  Location: [facility];  Service: Cardiovascular;  Laterality: Left;  AT and DP   PERIPHERAL VASCULAR INTERVENTION Left 06/10/2022   Procedure: PERIPHERAL VASCULAR INTERVENTION;  Surgeon: Cephus Shelling, MD;  Location: [facility] INVASIVE CV LAB;  Service: Cardiovascular;  Laterality: Left;  SFA   RADIOLOGY WITH ANESTHESIA N/A 01/08/2022   Procedure: IR WITH ANESTHESIA;  Surgeon: Radiologist, Medication, MD;  Location: [facility];  Service: Radiology;  Laterality: N/A;   THROMBECTOMY BRACHIAL ARTERY Right 11/18/2021   Procedure: RIGHT BRACHIAL, RADIAL, & ULNAR ARTERY THROMBECTOMY.;  Surgeon: Cephus Shelling, MD;  Location: [facility];  Service:  Vascular;  Laterality: Right;   TUBAL LIGATION      Allergies  Allergen Reactions   Visipaque [Iodixanol] Nausea And Vomiting    Patient had vagal response during procedure became hypertensive, and bradycardic. CO2 used during procedure prior to contrast being used, this may be cause as well. Recommended premedicating prior to contrast being used in the future.    Wellbutrin [Bupropion] Hives   Gabapentin     dizzy   Ciprofloxacin Hcl Hives and Rash    Hives/rash at injection site; 01/15/22 tolerated IV cipro   Tape Rash    Prior to Admission medications   Medication Sig Start Date End Date Taking? Authorizing Provider  acetaminophen (TYLENOL) 325 MG tablet Take 1-2 tablets (325-650 mg total) by mouth every  4 (four) hours as needed for mild pain. Patient taking differently: Take 650 mg by mouth as needed for mild pain (pain score 1-3). 02/18/22  Yes Love, Evlyn Kanner, PA-C  atorvastatin (LIPITOR) 40 MG tablet TAKE 1 TABLET BY MOUTH ONCE DAILY. 01/25/23  Yes Billie Lade, MD  clonazePAM (KLONOPIN) 1 MG disintegrating tablet Take 1 tablet (1 mg total) by mouth 2 (two) times daily as needed for seizure (For seizure lasting more than 2 minutes.). 05/16/23  Yes Billie Lade, MD  clopidogrel (PLAVIX) 75 MG tablet Take 1 tablet (75 mg total) by mouth daily. 05/16/23  Yes Billie Lade, MD  furosemide (LASIX) 40 MG tablet TAKE 1 TABLET BY MOUTH ONCE DAILY AS NEEDED FOR SWELLING *PATIENT NEEDS APPOINTMENT* 05/13/23  Yes Branch, Dorothe Pea, MD  hydrALAZINE (APRESOLINE) 25 MG tablet Take 1 tablet (25 mg total) by mouth in the morning and at bedtime. 04/24/23  Yes Vassie Loll, MD  insulin aspart (NOVOLOG) 100 UNIT/ML injection Use with Omnipod for daily dose around 100 units daily Patient taking differently: Inject 0-200 Units into the skin See admin instructions. Load Omnipod with 200 units every 3-4 days. 10/18/22  Yes Reardon, Freddi Starr, NP  isosorbide mononitrate (IMDUR) 60 MG 24 hr tablet  TAKE TWO (2) TABLETS BY MOUTH EVERY DAY *NEW PRESCRIPTION REQUEST* 05/12/23  Yes Billie Lade, MD  levothyroxine (SYNTHROID) 125 MCG tablet Take 1 tablet (125 mcg total) by mouth daily at 6 (six) AM. 06/17/22 06/12/23 Yes Dani Gobble, NP  metoprolol succinate (TOPROL-XL) 50 MG 24 hr tablet Take 1 tablet (50 mg total) by mouth daily. Take with or immediately following a meal. 05/03/23 08/01/23 Yes Carollee Herter, DO  nitroGLYCERIN (NITROSTAT) 0.4 MG SL tablet Place 1 tablet (0.4 mg total) under the tongue every 5 (five) minutes as needed for chest pain. 05/16/23  Yes Billie Lade, MD  oxyCODONE-acetaminophen (PERCOCET) 10-325 MG tablet Take 1 tablet by mouth every 4 (four) hours as needed for pain. 05/12/23  Yes Vickki Hearing, MD  pantoprazole (PROTONIX) 40 MG tablet Take 1 tablet (40 mg total) by mouth daily. Courtesy fill/ pt to get established with a primary MD for further refills Patient taking differently: Take 40 mg by mouth daily as needed (heartburn). 05/16/23 11/12/23 Yes Billie Lade, MD  ranolazine (RANEXA) 1000 MG SR tablet TAKE ONE (1) TABLET BY MOUTH TWICE DAILY *NEW PRESCRIPTION REQUEST* 05/12/23  Yes Billie Lade, MD  senna-docusate (SENOKOT-S) 8.6-50 MG tablet Take 1-2 tablets by mouth daily as needed for mild constipation. 05/16/23  Yes Billie Lade, MD  sodium bicarbonate 650 MG tablet Take 1 tablet (650 mg total) by mouth 2 (two) times daily. 05/16/23 06/15/23 Yes Billie Lade, MD  tiZANidine (ZANAFLEX) 4 MG tablet TAKE 2 TABLETS BY MOUTH ONCE DAILY AT BEDTIME 05/16/23  Yes Billie Lade, MD  topiramate (TOPAMAX) 50 MG tablet Take 1 tablet (50 mg total) by mouth 2 (two) times daily. 03/16/23  Yes Emokpae, Courage, MD  traZODone (DESYREL) 100 MG tablet TAKE 1 TABLET BY MOUTH AT BEDTIME *NEW PRESCRIPTION REQUEST* 05/12/23  Yes Billie Lade, MD  warfarin (COUMADIN) 5 MG tablet TAKE ONE TABLET BY MOUTH DAILY EXCEPT 1/2 TABLET ON WEDNESDAYS AND SATURDAYS OR AS  DIRECTED Patient taking differently: Take 2.5-5 mg by mouth daily. Take 5mg  (1 tablet) daily, except on Tuesday, Thursday, and Saturday's take 2.5mg  (1/2 tablet). 02/14/23  Yes Branch, Dorothe Pea, MD  Continuous Glucose Sensor (DEXCOM G6  SENSOR) MISC CHANGE SENSOR EVERY 10 DAYS. 05/16/23   Dani Gobble, NP  Continuous Glucose Transmitter (DEXCOM G6 TRANSMITTER) MISC Change transmitter every 90 days as directed. 01/20/23   Roma Kayser, MD  Insulin Disposable Pump (OMNIPOD 5 DEXG7G6 PODS GEN 5) MISC Change pod every 48-72 hours. 11/11/22   Dani Gobble, NP    Social History   Socioeconomic History   Marital status: Married    Spouse name: Harvel Quale   Number of children: 2   Years of education: 12   Highest education level: 12th grade  Occupational History    Comment: Full time   Occupation: CNA at [employer]    Comment: applying for disability  Tobacco Use   Smoking status: Former    Current packs/day: 0.00    Average packs/day: 1 pack/day for 15.0 years (15.0 ttl pk-yrs)    Types: Cigarettes    Start date: 01/2007    Quit date: 01/2022    Years since quitting: 1.3    Passive exposure: Past   Smokeless tobacco: Never  Vaping Use   Vaping status: Never Used  Substance and Sexual Activity   Alcohol use: Yes    Comment: occ   Drug use: No   Sexual activity: Yes    Birth control/protection: Surgical    Comment: tubal   Other Topics Concern   Not on file  Social History Narrative   Lives with husband and kids   Right handed   Drinks 9+ cups caffeine daily   Social Drivers of Health   Financial Resource Strain: Low Risk  (11/09/2022)   Overall Financial Resource Strain (CARDIA)    Difficulty of Paying Living Expenses: Not very hard  Food Insecurity: Food Insecurity Present (05/21/2023)   Hunger Vital Sign    Worried About Running Out of Food in the Last Year: Often true    Ran Out of Food in the Last Year: Often true  Transportation Needs: Unmet  Transportation Needs (05/18/2023)   PRAPARE - Transportation    Lack of Transportation (Medical): Yes    Lack of Transportation (Non-Medical): Yes  Physical Activity: Inactive (03/03/2022)   Exercise Vital Sign    Days of Exercise per Week: 0 days    Minutes of Exercise per Session: 0 min  Stress: No Stress Concern Present (03/03/2022)   [employer] of Occupational Health - Occupational Stress Questionnaire    Feeling of Stress : Only a little  Recent Concern: Stress - Stress Concern Present (12/22/2021)   [employer] of Occupational Health - Occupational Stress Questionnaire    Feeling of Stress : To some extent  Social Connections: Socially Integrated (03/03/2022)   Social Connection and Isolation Panel [NHANES]    Frequency of Communication with Friends and Family: More than three times a week    Frequency of Social Gatherings with Friends and Family: More than three times a week    Attends Religious Services: More than 4 times per year    Active Member of [employer] or Organizations: Yes    Attends Engineer, structural: More than 4 times per year    Marital Status: Married  Catering manager Violence: Not At Risk (05/18/2023)   Humiliation, Afraid, Rape, and Kick questionnaire    Fear of Current or Ex-Partner: No    Emotionally Abused: No    Physically Abused: No    Sexually Abused: No     Family History  Problem Relation Age of Onset   Thyroid disease Mother  Hypertension Mother    Hyperlipidemia Mother    CAD Mother    CVA Mother    Hyperlipidemia Father    Hypertension Father    CAD Father    Breast cancer Paternal Grandmother 75   Cancer Paternal Grandmother        lung    ROS: [x]  Positive   [ ]  Negative   [ ]  All sytems reviewed and are negative  Cardiac: [x]  chest pain/pressure [x]  CAD with elevated troponin consistent with demand ischemia [x]  heart failure with EF 30-35%   Vascular: [x]  non-healing ulcers [x]  swelling in  legs  Pulmonary: []  asthma/wheezing []  home O2  Neurologic: [x]  hx of CVA with left hemiparesis    Hematologic: [x]  anemia  Endocrine:   [x]  diabetes []  thyroid disease  GI []  GERD  GU: [x]  CKD/renal failure []  HD--[]  M/W/F or []  T/T/S  Psychiatric: []  anxiety []  depression  Musculoskeletal: [x]  left humerus fx December 2024  Integumentary:  [x]  ulcers, redness right foot  Constitutional: []  fever   Physical Examination  Vitals:   05/22/23 2058 05/23/23 0256  BP: (!) 120/54 139/88  Pulse: 80 95  Resp: 18 18  Temp: (!) 97.5 F (36.4 C) 97.8 F (36.6 C)  SpO2: 100% 100%   Body mass index is 35.07 kg/m.  General:  WDWN in NAD Gait: Not observed HENT: WNL, normocephalic Pulmonary: normal non-labored breathing Cardiac: regular Abdomen: obese Skin: without rashes Vascular Exam/Pulses: Right brisk AT/PT doppler flow; monophasic DP out onto the foot Left brisk PT doppler flow Pt sitting in chair and unable to examine femoral pulses.  Extremities: BLE swelling  Musculoskeletal: no muscle wasting or atrophy  Neurologic: A&O X 3 Psychiatric:  The pt has  flat  affect.   CBC    Component Value Date/Time   WBC 7.7 05/23/2023 0625   RBC 2.71 (L) 05/23/2023 0625   HGB 7.4 (L) 05/23/2023 0625   HGB 12.3 02/18/2023 1032   HCT 24.1 (L) 05/23/2023 0625   HCT 38.4 02/18/2023 1032   PLT 330 05/23/2023 0625   PLT 347 02/18/2023 1032   MCV 88.9 05/23/2023 0625   MCV 93 02/18/2023 1032   MCH 27.3 05/23/2023 0625   MCHC 30.7 05/23/2023 0625   RDW 17.1 (H) 05/23/2023 0625   RDW 14.0 02/18/2023 1032   LYMPHSABS 1.9 05/17/2023 1745   LYMPHSABS 1.9 08/11/2022 1510   MONOABS 0.7 05/17/2023 1745   EOSABS 0.2 05/17/2023 1745   EOSABS 0.1 08/11/2022 1510   BASOSABS 0.0 05/17/2023 1745   BASOSABS 0.1 08/11/2022 1510    BMET    Component Value Date/Time   NA 139 05/23/2023 0625   NA 140 08/11/2022 1511   K 4.0 05/23/2023 0625   CL 107 05/23/2023 0625    CO2 20 (L) 05/23/2023 0625   GLUCOSE 268 (H) 05/23/2023 0625   BUN 23 (H) 05/23/2023 0625   BUN 37 (H) 08/11/2022 1511   CREATININE 2.44 (H) 05/23/2023 0625   CALCIUM 8.1 (L) 05/23/2023 0625   GFRNONAA 25 (L) 05/23/2023 0625   GFRAA 31 (L) 02/01/2020 1328    COAGS: Lab Results  Component Value Date   INR 2.9 (H) 05/23/2023   INR 2.4 (H) 05/22/2023   INR 2.6 (H) 05/22/2023     Non-Invasive Vascular Imaging:   ABI's/TBI's on 11/30/2022: Right:  [place]/0.56 - Great toe pressure: 90 Left:  [place]/0.55 - Great toe pressure: 89   Arterial duplex on 11/30/2022: +-----------+--------+-----+---------------+----------+----------+  LEFT  PSV cm/sRatioStenosis       Waveform  Comments    +-----------+--------+-----+---------------+----------+----------+  EIA Distal 102                         triphasic             +-----------+--------+-----+---------------+----------+----------+  CFA Prox   86                          triphasic             +-----------+--------+-----+---------------+----------+----------+  CFA Distal 83                          triphasic             +-----------+--------+-----+---------------+----------+----------+  DFA       88                          biphasic              +-----------+--------+-----+---------------+----------+----------+  SFA Prox   149                         triphasic             +-----------+--------+-----+---------------+----------+----------+  SFA Mid    126                         triphasic stent area  +-----------+--------+-----+---------------+----------+----------+  SFA Distal 158          30-49% stenosistriphasic             +-----------+--------+-----+---------------+----------+----------+  POP Prox   112                         triphasic             +-----------+--------+-----+---------------+----------+----------+  POP Mid    66                          triphasic              +-----------+--------+-----+---------------+----------+----------+  POP Distal 68                          triphasic             +-----------+--------+-----+---------------+----------+----------+  ATA Distal 50                          monophasic            +-----------+--------+-----+---------------+----------+----------+  PTA Distal 18                          monophasic            +-----------+--------+-----+---------------+----------+----------+  PERO Distal29                          monophasic            +-----------+--------+-----+---------------+----------+----------+  Left Stent(s):  +---------------+---++---------++  Prox to Stent  151triphasic  +---------------+---++---------++  Proximal Stent 148triphasic  +---------------+---++---------++  Mid Stent      139triphasic  +---------------+---++---------++  Distal Stent   71 triphasic  +---------------+---++---------++  Distal to Stent84 triphasic  +---------------+---++---------++  Summary:  Left: Patent left SFA stent with no visualized stenosis.      Previous ABI's/TBI's on 07/20/2022: Right:  0.94/0.67 - Great toe pressure: 88 Left:  0.88/[place] - Great toe pressure:  [place]    ASSESSMENT/PLAN: This is a 43 y.o. female with right foot ulcerations and has hx of CKD, CHF.    She has a vascular  hx of CO2 angiogram with left ATA, DP and SFA angioplasty and stenting of left SFA on 06/10/2022 by Dr. Chestine Spore for CLI of LLE with tissue loss.  She was noted to have significant small vessel disease.  She underwent excision of left 5th metatarsal with debridement of ulcer and 4th ray amputation for osteomyelitis by Dr. Ardelle Anton.  She is scheduled with our office on 1/28 for ABI and LLE arterial duplex.  Will order this while she is hospitalized.   She has hx of acute ischemic RUE and underwent right brachial, radial, and ulnar thrombectomy on 11/18/2021 by Dr. Chestine Spore.  Cardio  embolic work up did not reveal source and she was referred to hem-onc for further work up.    -pt with new right ulcerations and possible osteomyelitis.  She does have hx of CKD with creatinine today of 2.44. she most likely will need angiogram of RLE with CO2.  She is on coumadin and her INR today 2.9.   I was not able to evaluate her femoral pulses as she is sitting in the chair.     -Dr. Hetty Blend to evaluate pt and determine further plan  Discussed with her that given her co morbidities and hx of smoking, she is at risk of amputation but will need more information about her arterial flow.   Doreatha Massed, PA-C [facility] [phone]    VASCULAR STAFF ADDENDUM: I have independently interviewed and examined the patient. I agree with the above.  Ms. Cossey is a 56-year-old female who underwent revascularization with left AT, DP and SFA angioplasty with stenting of the left SFA in February 2024.  Her left toe amputation sites have since healed but she has been pain-free.  She now presents with right foot wounds with osteomyelitis. Plan to obtain ABI and left lower extremity duplex to to assess the previously treated segments of the left leg as she has a follow-up scheduled with [name] for next week which we will cancel. ABI on the right was previously noncompressible with a toe pressure of 89 in March 2024.  I explained that if her current perfusion is stable from then she has marginal wound healing capabilities and would likely benefit from an angiogram. -Will follow-up ABI and left leg duplex -Potentially plan for right lower extremity angiogram pending vascular testing -Please hold Coumadin, can be bridged with heparin if needed  Daria Pastures MD [facility] Phone Number: [phone] 05/23/2023 1:33 PM

## 2023-05-23 NOTE — Progress Notes (Signed)
Pt unable to receive IV medications throughout shift.  IV access was lost and IV consult was placed. Per IV team, unable to place IV while pt is in chair.  Pt refused to get back in bed multiple times.  MD aware.

## 2023-05-23 NOTE — Progress Notes (Signed)
VASCULAR LAB    ABI has been performed.  See CV proc for preliminary results.   Sair Faulcon, RVT 05/23/2023, 4:38 PM

## 2023-05-23 NOTE — Progress Notes (Addendum)
PHARMACY - ANTICOAGULATION CONSULT NOTE  Pharmacy Consult for warfarin Indication: CVA  Allergies  Allergen Reactions   Visipaque [Iodixanol] Nausea And Vomiting    Patient had vagal response during procedure became hypertensive, and bradycardic. CO2 used during procedure prior to contrast being used, this may be cause as well. Recommended premedicating prior to contrast being used in the future.    Wellbutrin [Bupropion] Hives   Gabapentin     dizzy   Ciprofloxacin Hcl Hives and Rash    Hives/rash at injection site; 01/15/22 tolerated IV cipro   Tape Rash    Patient Measurements: Height: 5\' 5"  (165.1 cm) Weight: 95.6 kg (210 lb 12.2 oz) IBW/kg (Calculated) : 57 Heparin Dosing Weight:   Vital Signs: Temp: 97.8 F (36.6 C) (01/20 0256) Temp Source: Oral (01/20 0256) BP: 139/88 (01/20 0256) Pulse Rate: 95 (01/20 0256)  Labs: Recent Labs    05/21/23 0449 05/22/23 0603 05/22/23 1042 05/23/23 0625  HGB 7.1* 7.4* 9.0* 7.4*  HCT 23.9* 24.2* 29.9* 24.1*  PLT 377 369 425* 330  LABPROT  --  28.2* 26.2* 30.5*  INR  --  2.6* 2.4* 2.9*  CREATININE 1.92* 2.26*  --  2.44*    Estimated Creatinine Clearance: 35 mL/min (A) (by C-G formula based on SCr of 2.44 mg/dL (H)).   Medical History: Past Medical History:  Diagnosis Date   Acute ischemic stroke (HCC) 01/01/2020   Acute kidney injury superimposed on CKD (HCC) 01/01/2020   Acute right MCA stroke (HCC) 01/28/2022   Anemia    CAD (coronary artery disease)    a. s/p cath in 03/2014 showing 30% mid-LAD, moderate to severe disease along small D1, patent LCx, moderate to severe distal OM2 stenosis and moderate diffuse diease along RCA not amenable to PCI   Cerebrovascular accident (CVA) (HCC) 12/31/2019   CHF (congestive heart failure) (HCC)    a. EF 55-60% in 12/2019 b. EF at 35-40% by echo in 05/2020   CKD (chronic kidney disease) stage 4, GFR 15-29 ml/min (HCC)    Diabetes mellitus without complication (HCC)    Myocardial  infarction (HCC)    Neuropathy    PVD (peripheral vascular disease) (HCC)    Stroke (HCC) 01/2022   L hand weakness   Assessment: Pharmacy consulted to dose warfarin in patient with history of CVA. Home dose listed as 2.5 mg on Tue/Thu/Sat and 5 mg ROW with last dose listed as 1/12.  INR up to 2.9, mild nosebleed noted 1/19. Hgb down to 7.4 (7s most of this admission).  Called by Dr. Nelson Chimes and podiatry would like INR </= 1.5 in order to take for bone biopsy. Will need to hold coumadin and will give small dose of oral vit K 2.5mg  today.  Goal of Therapy:  INR 2-3 Monitor platelets by anticoagulation protocol: Yes   Plan:  Hold warfarin Vitamin K 2.5mg  po today Monitor daily INR and s/s of bleeding  Christoper Fabian, PharmD, BCPS Please see amion for complete clinical pharmacist phone list 05/23/2023 9:01 AM

## 2023-05-23 NOTE — Progress Notes (Signed)
Mobility Specialist Progress Note:   05/23/23 1540  Mobility  Activity Ambulated with assistance in hallway  Level of Assistance  (MinG)  Assistive Device None  Distance Ambulated (ft) 35 ft  LUE Weight Bearing Per Provider Order NWB  Activity Response Tolerated well  Mobility Referral Yes  Mobility visit 1 Mobility  Mobility Specialist Start Time (ACUTE ONLY) 1435  Mobility Specialist Stop Time (ACUTE ONLY) 1450  Mobility Specialist Time Calculation (min) (ACUTE ONLY) 15 min   Pt received in chair, agreeable to mobility. SB to stand. MinG during ambulation. Pt displayed slight unsteady gait often reaching out to objects for balance with RUE. Pt c/o leg weakness during ambulation, but no LOB or knee buckling present. Pt left in chair with call bell in reach and all needs met. Chair alarm on.  Leory Plowman  Mobility Specialist Please contact via SecureChat Rehab office at [phone]

## 2023-05-23 NOTE — Progress Notes (Signed)
VASCULAR LAB    Left lower extremity arterial duplex has been performed.  See CV proc for preliminary results.   Joelyn Lover, RVT 05/23/2023, 4:40 PM

## 2023-05-23 NOTE — TOC Progression Note (Signed)
Transition of Care [facility]) - Progression Note    Patient Details  Name: Lauren [name] MRN: [id number] Date of Birth: 12/01/1980  [facility] of Care [facility]) CM/SW Contact  Ronny Bacon, RN Phone Number: 05/23/2023, 2:27 PM  Clinical Narrative:   Spoke with patient at bedside. Reports that she does not have anyone at home that can help care for her. Discussed cost of SNF ($2500/day up to $9000 and HH ($80/visit). Patient unable to afford any of those options. Discussed outpatient physical therapy, patient confirms she could get transportation to [facility] rehab facility. Patient mentioned that she was approved for social security disability but unsure if she was approved for disability medicare or [employer], pt will talk with Child psychotherapist from social security to see if she qualifies for [employer] or medicaid.    Expected Discharge Plan: Skilled Nursing Facility Barriers to Discharge: Continued Medical Work up  Expected Discharge Plan and Services In-house Referral: Clinical Social Work Discharge Planning Services: CM Consult Post Acute Care Choice: Skilled Nursing Facility Living arrangements for the past 2 months: Single Family Home                                       Social Determinants of Health (SDOH) Interventions SDOH Screenings   Food Insecurity: Food Insecurity Present (05/21/2023)  Housing: Low Risk  (05/18/2023)  Transportation Needs: Unmet Transportation Needs (05/18/2023)  Utilities: Patient Declined (05/18/2023)  Alcohol Screen: Low Risk  (11/09/2022)  Depression (PHQ2-9): Low Risk  (02/18/2023)  Recent Concern: Depression (PHQ2-9) - Medium Risk (02/04/2023)  Financial Resource Strain: Low Risk  (11/09/2022)  Physical Activity: Inactive (03/03/2022)  Social Connections: Socially Integrated (03/03/2022)  Stress: No Stress Concern Present (03/03/2022)  Recent Concern: Stress - Stress Concern Present (12/22/2021)  Tobacco Use: Medium Risk  (05/17/2023)    Readmission Risk Interventions    05/18/2023    1:36 PM 04/22/2023    3:05 PM 03/16/2023   11:32 AM  Readmission Risk Prevention Plan  Transportation Screening Complete Complete Complete  Medication Review Oceanographer) Complete Complete Complete  PCP or Specialist appointment within 3-5 days of discharge  Not Complete Not Complete  HRI or Home Care Consult Complete Complete Complete  SW Recovery Care/Counseling Consult Complete Complete Complete  Palliative Care Screening Not Applicable Not Applicable Not Applicable  Skilled Nursing Facility Complete Not Applicable Not Applicable

## 2023-05-23 NOTE — Progress Notes (Signed)
VASCULAR LAB    Left upper extremity venous duplex has been performed.  See CV proc for preliminary results.   Declan Adamson, RVT 05/23/2023, 4:38 PM

## 2023-05-23 NOTE — Progress Notes (Addendum)
[facility] PROGRESS NOTE  Lauren [name] (DOB: Jan 29, 1981) ZOX:[id number] PCP: Billie Lade, MD Outpatient Specialists: Podiatry, Dr. Ninetta Lights  Brief Narrative: Lauren [name] is a 43 y.o. 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   She had recent admits 04/21/23- 04/24/23 for DKA and UTI and 03/2023 with seizure, DKA, and AKI.   Work up showed her to be afebrile with soft BP, with normocytic anemia and thrombocytosis, WBC normal at 8.2k. AKI noted (Cr 2.7 from baseline ~1.9). She had redness to the right foot, XR without significant osseous finding. She's had significant hypoglycemia and hyperglycemia thus far.   MRI of the right foot 1/15 suggested acute osteomyelitis for which IV antibiotics were started, blood cultures ordered. Case discussed with the patient's podiatrist who suggested formal evaluation as inpatient for possible debridement, so transfer to [facility] is being pursued.   1/19: Patient was transferred from [name] for podiatry evaluation-currently pending.  Dr. [name] was notified. Having mild nosebleed this morning.  INR 2.4, hemoglobin at 9  1/20: Podiatry requested vascular consult before doing any procedure due to her extensive history of PAD.  Vascular was consulted and they order arterial duplex before making decision for angiography or any other intervention. Coumadin is being held as podiatry wants INR to be less than 1.5 before taking her to the OR.   Hemoglobin decreased to 7.8 this morning, anemia panel with iron deficiency and borderline folate.  Ordered 1 dose of IV  iron.  PT is recommending SNF  Subjective: Patient was complaining of allover pain and wants to get out of the hospital and seen today.  Denies any obvious bleeding.  Objective: BP 139/88 (BP Location: Right Arm)   Pulse 95   Temp 97.8 F (36.6 C) (Oral)   Resp 18   Ht 5\' 5"  (1.651 m)   Wt 95.6 kg   LMP 05/05/2023 (Exact Date)   SpO2 100%   BMI 35.07 kg/m    General.  Obese lady, in no acute distress. Pulmonary.  Lungs clear bilaterally, normal respiratory effort. CV.  Regular rate and rhythm, no JVD, rub or murmur. Abdomen.  Soft, nontender, nondistended, BS positive. CNS.  Alert and oriented .  No focal neurologic deficit. Extremities.  1+ bilateral LE edema, erythema and edema of right foot.    Assessment & Plan: Right foot cellulitis/myositis, acute osteomyelitis:   - Continue linezolid and ceftriaxone as discussed with ID Pharm.  -Blood culture remain negative. - MRI right foot showed cortical erosion and marrow edema consistent with acute osteomyelitis of the left great toe. Discussed with podiatry, Drs. Standiford and McDonald who will evaluate the patient formally on transfer to [facility], suspect she'd benefit from at least debridement and/or biopsy. Podiatry is requesting vascular consultation before proceeding which was ordered.  Poorly controlled T1DM: With hyper- and hypo- glycemia. On insulin pump, though may not be managing very well at home. Dx age 32.  - Based on insulin pump basal ~21u/day, ordered 15u glargine daily, decrease mealtime insulin to 2u TIDWC, continue sensitive SSI. Glycemic control improving, but labile based on somewhat erratic po intake.  AKI on stage IV CKD: Due to dehydration. Oddly, renal  U/S showed normal echogenicity, also no hydronephrosis. UA showed hyaline casts and 6-10 RBCs, no WBCs (on dirty catch specimen) - Will monitor renal parameters daily, stabilizing. Continues to be better than recent baseline.  - Restarted bicarb tabs for NAGMA  (resolved)  Acute metabolic encephalopathy: Likely attributable to infection, AKI, on chronic abnormality/history of CVA. D/w daughter, has chronically impaired short term memory. She is not oriented at baseline reliably.  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 -1 dose of IV iron ordered -1 dose of Epogen ordered -Monitor hemoglobin -Transfuse if below 8  History of CVA, carotid stenosis: Right hemisphere encephalomalacia noted on CT head with stent in right ICA.  -INR was 2.9 today, Coumadin was placed on hold for upcoming procedure.  Hypokalemia:  -Continue to monitor and replete as needed  Chronic combined HFrEF, HTN: Echo within the past month showed LVEF 30-35%.  - Continue metoprolol, hydralazine, isosorbide. Not on ACE/ARB/ARNI due to advanced CKD.   CAD with elevated troponin consistent with demand myocardial ischemia: Atypical chest pain resolved spontaneously, does not seem to represent ACS at this time.  - Continue plavix, statin (can continue now that LFTs are back to normal), beta blocker - Pt is on anticoagulation regardless, and would not be a candidate for cardiac catheterization per notes from last hospitalization.  - Note pt consistently refuses cardiac monitoring, discontinued.   Left humerus fracture:  - Remain in sling, nonoperative management based on fracture position and adverse risk profile. - Pain control with percocet.  - Orthopedics outpatient follow up, Dr. Romeo Apple recommended repeat XR in 2 weeks at time of office visit 1/9 (would be ~1/23).  - R/o DVT with UE venous U/S (as tolerated), pt on anticoagulation and therapeutic INR regardless. Elevate extremity. D/w pt and  RN.  Falls at home: Some left hemiparesis from CVA noted.  - PT/OT evaluations performed, pt would benefit from rehabilitation at SNF level of care.   Hypothyroidism: TSH 2.105.  - Continue synthroid.   GERD:  - PPI  Seizure disorder: No seizure activity noted thus far.  - Topamax was stopped at admission due to AMS, though we will restart to avoid provoking epileptic episode.  Obesity, class 1: Body mass index is 35.07 kg/m.   DNR: POA. Noted.   Talked with daughter on phone.  [name], MD [facility] [email or web address] 05/23/2023, 1:27 PM

## 2023-05-23 NOTE — Progress Notes (Addendum)
VAST consult. Arrived to patient room. 1121 sent nurse secure chat to have patient in bed. Nurse reports patient has anxiety with being in bed. Arrived to room. Patient up in chair eating dinner. For appropriate PIV placement. Patient will need to be in bed for PIV placement with use of [name]. Pt is restricted to R arm only. Patient does seem anxious/agitated about having another PIV placed. Spoke with nurse on the phone. Requested nurse to consult VAST once patient is in bed and ready for IV team. VU. Tomasita Morrow, RN VAST

## 2023-05-23 NOTE — TOC Progression Note (Addendum)
Transition of Care [facility]) - Progression Note    Patient Details  Name: Lauren [name] MRN: [id number] Date of Birth: 01/25/1981  [facility] CM/SW Contact  Lauren Hammed, LCSW Phone Number: 05/23/2023, 9:55 AM  Clinical Narrative:    CSW noted pt has an insurance plan that may not have SNF coverage or copays that are extremely high and not conducive to pt's paying for them. CSW attempted to determine coverage but [employer] office will not provide info without speaking to a representative. Their offices are closed for MLK day. CSW updated team and will follow up tomorrow on coverage. TOC will continue to follow.   2:15 CSW spoke with Lewayne Bunting rep who pulled pt's insurance coverage and noted pt has a $9000 out of pocket cost for SNF, with a $2500 a day copay until this is met. Pt has an $80 a visit copay for [facility] services. Pt was able to walk 123 feet min assist today. CM to discuss options with pt/ TOC will continue to follow.   Expected Discharge Plan: Skilled Nursing Facility Barriers to Discharge: Continued Medical Work up  Expected Discharge Plan and Services In-house Referral: Clinical Social Work Discharge Planning Services: CM Consult Post Acute Care Choice: Skilled Nursing Facility Living arrangements for the past 2 months: Single Family Home                                       Social Determinants of Health (SDOH) Interventions SDOH Screenings   Food Insecurity: Food Insecurity Present (05/21/2023)  Housing: Low Risk  (05/18/2023)  Transportation Needs: Unmet Transportation Needs (05/18/2023)  Utilities: Patient Declined (05/18/2023)  Alcohol Screen: Low Risk  (11/09/2022)  Depression (PHQ2-9): Low Risk  (02/18/2023)  Recent Concern: Depression (PHQ2-9) - Medium Risk (02/04/2023)  Financial Resource Strain: Low Risk  (11/09/2022)  Physical Activity: Inactive (03/03/2022)  Social Connections: Socially Integrated (03/03/2022)  Stress: No Stress Concern Present  (03/03/2022)  Recent Concern: Stress - Stress Concern Present (12/22/2021)  Tobacco Use: Medium Risk (05/17/2023)    Readmission Risk Interventions    05/18/2023    1:36 PM 04/22/2023    3:05 PM 03/16/2023   11:32 AM  Readmission Risk Prevention Plan  Transportation Screening Complete Complete Complete  Medication Review Oceanographer) Complete Complete Complete  PCP or Specialist appointment within 3-5 days of discharge  Not Complete Not Complete  HRI or Home Care Consult Complete Complete Complete  SW Recovery Care/Counseling Consult Complete Complete Complete  Palliative Care Screening Not Applicable Not Applicable Not Applicable  Skilled Nursing Facility Complete Not Applicable Not Applicable

## 2023-05-23 NOTE — Progress Notes (Addendum)
Physical Therapy Treatment Patient Details Name: Lauren [name] MRN: [id number] DOB: 09-04-1980 Today's Date: 05/23/2023   History of Present Illness Pt is a 43 y.o. F who presents with reports of chest pain and  MRI of the right foot 05/17/2022 suggesting possible acute osteomyelitis. Recently admitted for DKA and acute metabolic encephalopathy, UTI and 03/2023 with seizure. Significant PMH: DM1, seizures, CVA, CKD stage 4, NSTEMI, recent left proximal humerus fx after fall 12/23.    PT Comments  Patient was willing to participate with therapy today and was seen laying in supine in her bed. The patient was able to independently transition from supine to sitting EOB, while maintain LUE weightbearing precautions. Patient required assistance when transitioning from sit > stand HHA +1 prior to ambulation and HHA +2 following ambulation due to bilateral LE's "giving out". The patient was able ambulate within her room and in the unit for 150ft with HHA +1, before being rolled back to her room in a chair. When ambulating, the patient required some rest breaks in between due to both legs feeling fatigued. Patient will benefit from continued inpatient follow up therapy, <3 hours/day pending insurance approval. Will defer to HHPT if disapproved.    If plan is discharge home, recommend the following: A little help with walking and/or transfers;Help with stairs or ramp for entrance;A little help with bathing/dressing/bathroom;Assistance with cooking/housework   Can travel by private vehicle     No  Equipment Recommendations  None recommended by PT    Recommendations for Other Services       Precautions / Restrictions Precautions Precautions: Fall Restrictions Weight Bearing Restrictions Per Provider Order: Yes LUE Weight Bearing Per Provider Order: Non weight bearing Other Position/Activity Restrictions: Remain in sling     Mobility  Bed Mobility Overal bed mobility: Independent Bed Mobility:  Supine to Sit     Supine to sit: Independent     General bed mobility comments: Patient was able to transition to EOB while maintaing LUE weightbearing precautions    Transfers Overall transfer level: Needs assistance Equipment used: 1 person hand held assist Transfers: Sit to/from Stand Sit to Stand: Min assist, Mod assist           General transfer comment: Patient stood from bed, requiring +1 HHA to ascend from sitting position. Patient required Mod A +1 following ambulation due to bilateral LEs "giving out"    Ambulation/Gait Ambulation/Gait assistance: Min assist Gait Distance (Feet): 123 Feet Assistive device: 1 person hand held assist Gait Pattern/deviations: Step-through pattern, Decreased stride length Gait velocity: decreased     General Gait Details: Increased lateral sway, wider BOS, CGA for safety   Stairs             Wheelchair Mobility     Tilt Bed    Modified Rankin (Stroke Patients Only)       Balance Overall balance assessment: Needs assistance Sitting-balance support: No upper extremity supported, Feet supported Sitting balance-Leahy Scale: Good Sitting balance - Comments: Sitting EOB   Standing balance support: Single extremity supported, During functional activity Standing balance-Leahy Scale: Good Standing balance comment: HHA +1                            Cognition Arousal: Alert Behavior During Therapy: Flat affect Overall Cognitive Status: Difficult to assess  General Comments: Patient with a flat affect, follows cammands, mood affected by status with her disability and lack of assistance at home        Exercises      General Comments        Pertinent Vitals/Pain Pain Assessment Pain Assessment: No/denies pain    Home Living                          Prior Function            PT Goals (current goals can now be found in the care plan  section) Acute Rehab PT Goals PT Goal Formulation: With patient Time For Goal Achievement: 06/01/23 Potential to Achieve Goals: Good Progress towards PT goals: Progressing toward goals    Frequency    Min 1X/week      PT Plan      Co-evaluation              AM-PAC PT "6 Clicks" Mobility   Outcome Measure  Help needed turning from your back to your side while in a flat bed without using bedrails?: None Help needed moving from lying on your back to sitting on the side of a flat bed without using bedrails?: None Help needed moving to and from a bed to a chair (including a wheelchair)?: A Little Help needed standing up from a chair using your arms (e.g., wheelchair or bedside chair)?: A Little Help needed to walk in hospital room?: A Little Help needed climbing 3-5 steps with a railing? : A Lot 6 Click Score: 19    End of Session Equipment Utilized During Treatment: Other (comment);Gait belt (LUE Sling) Activity Tolerance: Patient tolerated treatment well Patient left: in chair;with call bell/phone within reach;with chair alarm set Nurse Communication: Mobility status PT Visit Diagnosis: Unsteadiness on feet (R26.81);History of falling (Z91.81)     Time: 1027-2536 PT Time Calculation (min) (ACUTE ONLY): 36 min  Charges:    $Therapeutic Activity: 23-37 mins PT General Charges $$ ACUTE PT VISIT: 1 Visit                    Doreen Beam, SPT   Lauren [name] 05/23/2023, 12:42 PM

## 2023-05-24 DIAGNOSIS — N179 Acute kidney failure, unspecified: Secondary | ICD-10-CM | POA: Diagnosis not present

## 2023-05-24 DIAGNOSIS — N184 Chronic kidney disease, stage 4 (severe): Secondary | ICD-10-CM | POA: Diagnosis not present

## 2023-05-24 DIAGNOSIS — L03115 Cellulitis of right lower limb: Secondary | ICD-10-CM

## 2023-05-24 LAB — CBC
HCT: 26.2 % — ABNORMAL LOW (ref 36.0–46.0)
Hemoglobin: 7.8 g/dL — ABNORMAL LOW (ref 12.0–15.0)
MCH: 27 pg (ref 26.0–34.0)
MCHC: 29.8 g/dL — ABNORMAL LOW (ref 30.0–36.0)
MCV: 90.7 fL (ref 80.0–100.0)
Platelets: 360 10*3/uL (ref 150–400)
RBC: 2.89 MIL/uL — ABNORMAL LOW (ref 3.87–5.11)
RDW: 17.1 % — ABNORMAL HIGH (ref 11.5–15.5)
WBC: 8.2 10*3/uL (ref 4.0–10.5)
nRBC: 0 % (ref 0.0–0.2)

## 2023-05-24 LAB — BASIC METABOLIC PANEL
Anion gap: 9 (ref 5–15)
BUN: 19 mg/dL (ref 6–20)
CO2: 18 mmol/L — ABNORMAL LOW (ref 22–32)
Calcium: 8.1 mg/dL — ABNORMAL LOW (ref 8.9–10.3)
Chloride: 111 mmol/L (ref 98–111)
Creatinine, Ser: 2.18 mg/dL — ABNORMAL HIGH (ref 0.44–1.00)
GFR, Estimated: 29 mL/min — ABNORMAL LOW (ref >60)
Glucose, Bld: 191 mg/dL — ABNORMAL HIGH (ref 70–99)
Potassium: 3.7 mmol/L (ref 3.5–5.1)
Sodium: 138 mmol/L (ref 135–145)

## 2023-05-24 LAB — SURGICAL PCR SCREEN
MRSA, PCR: NEGATIVE
Staphylococcus aureus: NEGATIVE

## 2023-05-24 LAB — CULTURE, BLOOD (ROUTINE X 2)
Culture: NO GROWTH
Culture: NO GROWTH
Special Requests: ADEQUATE

## 2023-05-24 LAB — GLUCOSE, CAPILLARY
Glucose-Capillary: 168 mg/dL — ABNORMAL HIGH (ref 70–99)
Glucose-Capillary: 178 mg/dL — ABNORMAL HIGH (ref 70–99)
Glucose-Capillary: 76 mg/dL (ref 70–99)
Glucose-Capillary: 109 mg/dL — ABNORMAL HIGH (ref 70–99)
Glucose-Capillary: 170 mg/dL — ABNORMAL HIGH (ref 70–99)

## 2023-05-24 LAB — PROTIME-INR
INR: 1.9 — ABNORMAL HIGH (ref 0.8–1.2)
Prothrombin Time: 21.9 s — ABNORMAL HIGH (ref 11.4–15.2)

## 2023-05-24 LAB — VAS US ABI WITH/WO TBI
Left ABI: 0.79
Right ABI: 1.66

## 2023-05-24 MED ORDER — MUPIROCIN 2 % EX OINT: 1.0000 | TOPICAL_OINTMENT | Freq: Two times a day (BID) | CUTANEOUS | Status: DC

## 2023-05-24 MED ORDER — PHYTONADIONE 1 MG/0.5 ML ORAL SOLUTION: 1.0000 mg | Freq: Once | ORAL | Status: DC

## 2023-05-24 MED ORDER — PHYTONADIONE 1 MG/0.5 ML ORAL SOLUTION
1.0000 mg | Freq: Once | ORAL | Status: AC
  Administered 2023-05-24: 1 mg via ORAL
  Filled 2023-05-24: qty 0.5

## 2023-05-24 NOTE — Discharge Instructions (Signed)
[facility] assistance programs. If you are behind on your bills and expenses, and need some help to make it through a short term hardship or financial emergency, there are several organizations and charities in the [place] area that may be able to help. They range from the [employer]mark Stores, Liberty Global, Landscape architect of Weyerhaeuser Company and the local community action agency, the Intel, Avnet. These groups may be able to provide you resources to help pay your utility bills, rent, and they even offer housing assistance.  Crisis assistance program Find help for paying your rent, electric bills, free food, and even funds to pay your mortgage. The Liberty Global 989-686-0021) offers several services to local families, as funding allows. The Emergency Assistance Program (EAP), which they administer, provides household goods, free food, clothing, and financial aid to people in need in the [facility] area. The EAP program does have some qualification, and counselors will interview clients for financial assistance by written referral only. Referrals need to be made by the Department of Social Services or by other EAP approved human services agencies or charities in the area.  Money for resources for emergency assistance are available for security deposits for rent, water, electric, and gas, past due rent, utility bills, past due mortgage payments, food, and clothing. The Liberty Global also operates a Programme researcher, broadcasting/film/video on the site. More Liberty Global.  Open Door Ministries of [employer], which can be reached at [phone], offers emergency assistance programs for those in need of help, such as food, rent assistance, a soup kitchen, shelter, and clothing. They are based in [facility] but provide a number of services to those that qualify for assistance. Continue with Open Door Ministries  programs.  [facility] Department of Social Services may be able to offer temporary financial assistance and cash grants for paying rent and utilities. Help may be provided for local county residents who may be experiencing personal crisis when other resources, including government programs, are not available. Call [phone]  [place]. [place] Society, which is based in [place], provides financial assistance of up to $50.00 to help pay for rent, utilities, cooling bills, rent, and prescription medications. The program also provides secondhand furniture to those in need. [phone]  Mattel is a Geneticist, molecular. The organization can offer emergency assistance for paying rent, electric bills, utilities, food, household products and furniture. They offer extensive emergency and transitional housing for families, children and single women, and also run a Boy's and Dole Food. 301 Thrift Shops, [employer] Energy Corporation, and other aid offered too. [address] [place], [place] [address], [phone]  Additional locations of the [employer] are in [place] and other nearby communities. When you have an emergency, need free food, money for basic needs, or just need assistance around Christmas, then the Path[employer] Stores may have the resources you need. Or they can refer you to nearby agencies. Learn more.  [place] Low Income Risk manager - This is offered for [facility] families. The federal government created CIT Group Program provides a one-time cash grant payment to help eligible low-income families pay their electric and heating bills. [address], [place], [place] [address], [phone]  Government and [employer] - The county administers several emergency and self-sufficiency programs. Residents of [place]  can get help with energy bills and food, rent, and  other expenses. In addition,  work with a Sports coach who may be able to help you find a job or improve your employment skills. More [place] public assistance.  [place] Emergency Assistance - A program offers emergency utility and rent funds for greater [employer] area residents. The program can also provide counseling and referrals to charities and government programs. Also provides food and a free meal program that serves lunch Mondays - Saturdays and dinner seven days per week to individuals in the community. [address], [place], [place] [address], [phone]  [employer] - Offers affordable apartment and housing communities across [place] and [place]. The low income and seniors can access public housing, rental assistance to qualified applicants, and apply for the section 8 rent subsidy program. Other programs include Chiropractor and Engineer, maintenance. [address], [place], [place] [address], dial [phone].  Basic needs such as clothing - Low income families can receive free items (school supplies, clothes, holiday assistance, etc.) from clothing closets while more moderate income [address] families can shop at [employer]. Locations across the area help the needy. Get information on [place] Triad free clothing centers.  The [facility] provides transitional housing to veterans and the disabled. Clients will also access other services too, including life skills classes, case management, and assistance in finding permanent housing. [address] [place], [place] [address], call [phone]  [facility] Transitional Housing in [place] is for people who were just evicted or that are formerly homeless. The non-profit will also help then gain self-sufficiency, find a home or apartment to live in, and also provides information on rent assistance when needed. Dial [phone]  [employer] Partnership to End Homelessness is available in [place]. Families that were evicted or that are homeless can gain shelter, food, clothing, furniture, and also emergency financial assistance. Other services include financial skills and life skills coaching, job training, and case management. [address] [place], [place] [address]. Telephone [phone].  The Dynegy, Avnet. runs the [employer]. This can help people save money on their heating and summer cooling bills, and is free to low income families. Free upgrades can be made to your home. Phone [phone]  Many of the non-profits and programs mentioned above are all inclusive, meaning they can meet many needs of the low income, such as energy bills, food, rent, and more. However there are several organizations that focus just on rent and housing. Read more on rent assistance in [place] region.  Legal assistance for evictions, foreclosure, and more If you need free legal advice on civili issues, such as foreclosures, evictions, Electronics engineer, government programs, domestic issues and more, Armed forces operational officer Aid of [facility]) is a Associate Professor firm that provides free legal services and counsel to lower income people, seniors, disabled, and others. The goal is to ensure everyone has access to justice and fair representation.  Call them at [phone], or click here to learn more about [place] free legal assistance programs.  [place] Avnet and funds for emergency expenses The Pathmark Stores is another organization that can provide people with Deere & Company and funds to pay bills. Their assistance depends on funding, and the demand for help is always very high. They can provide cash to help pay rent, a missed mortgage payment, or gas, electric, and water bills. But the assistance doesn't stop there. They also have a food pantry  on site, which can provide  food once every three (3) months to people who need help. The KeyCorp can also offer a Engineering geologist once every three (3) months for a maximum three (3) times. After receiving this voucher over that period of time, applicants can receive this aid one every six (6) months after that. [phone].  Kohl's action agency The Intel, Avnet. offers job and Dispensing optician. Resources are focused on helping students obtain the skills and experiences that are necessary to compete in today's challenging and tight job market. The non-profit faith-based community action agency offers internship trainings as well as classroom instruction. Economically disadvantaged and challenged individuals and potential employers can use their services. Classes are tailored to meet the needs of people in the [facility] region. [place], [place] [address], [phone]    Foreclosure prevention services Housing Counseling and Education is also offered by [employer] of the [place]. The agency (phone number is below) is a Engineer, structural providing foreclosure advice and counseling. They offer mortgage resolution counseling and also reverse mortgage counseling. Counselors can direct people to both Kimberly-Clark, as well as Weyerhaeuser Company foreclosure assistance options.  Warehouse manager has locations in [place] and [place]. They run debt and foreclosure prevention programs for local families. A sampling of the programs offered include both Budget and Housing Counseling. This includes money management, financial advice, budget review and development of a written action plan with a Pensions consultant to help solve specific individual financial problems. In addition, housing and mortgage counselors can also provide pre- and post-purchase  homeownership counseling, default resolution counseling (to prevent foreclosure) and reverse mortgage counseling. A Debt Management Program allows people and families with a high level of credit card or medical debt to consolidate and repay consumer debt and loans to creditors and rebuild positive credit ratings and scores. [phone]  Debt assistance programs Receive free counseling and debt help from [facility] of the [place]. The [facility] based agency can be reached at [phone]. The counselors provide free help, and the services include budget counseling. This will help people manage their expenses and set goals. They also offer a Forensic scientist, which will help individuals consolidate their debts and become debt free. Most of the workshops and services are free.  Community clinics in [place] of the leading health and dental centers are listed below. They may be able to provide medication, physicals, dental care, and general family care to residents of all incomes and backgrounds across the region. Some of the programs focus on the low income and underinsured. However if these clinics can't meet your needs, find information and details on more clinics in [facility].  Some of the options include Mar[employer]ott of [employer]. This center provides free or low cost health care to low-income adults 18 - 64, who have no health insurance. Among other services offered include a pharmacy and eye clinic. Phone [phone]  [facility], which is located in [place], is a community clinic that provides primary medical and health care to uninsured and underinsured adults and families, as well as the low income, in the greater [place] area on a sliding-fee scale. Call [phone]  [place] Adult Dental Program - They run a dental assistance program that is organized by [facility] to provide dental services  and aid  to [employer]. Services offered by the dental clinic are limited to extractions, pain management, and minor restorative care. [phone]  [place] [facility] has locations in [facility] and [place]. The community clinics provide complete pediatric care including primary health, mental health, social work, neurology, cardiology, asthma. Dial [phone].  In addition to those [address] and Safeway Inc, find other free community clinics in [place] and across the county.  Food pantry and assistance Some of the local food pantries and distribution centers to call for free food and groceries include The Hive of [place] (phone [phone]), The Altus Lumber[employer] LP (phone [phone]) and also PPL Corporation. Dial [phone].  Several other food banks in the region provide clothing, free food and meals, access to soup kitchens and other help. Find the addresses and phone numbers of more food pantries in [place]. http://www.needhelppayingbills.com/html/[place]_assistance_pro.html  SUNDAYS BREAKFAST TWO LOCATIONS: 8:00am served in Nucor Corporation by Awaken PPL Corporation 8:30am SHUTTLE provided from [facility], served at Apache Corporation, [address]. LUNCH TWO LOCATIONS [plus one additional third Sunday only] 10:30am - 12:30pm served at Ecolab, Liberty Global, [place] [address] (1.2 miles from [facility]) 12:30pm served in [place] by Land O'Lakes Team (THIRD Sunday only) 1:30pm served at [facility] by [facility] one location [plus one additional third Sunday only] 5:00pm Every Sunday, served under the bridge at [address] by Lindell Noe Under the 3M Company (.7 miles from [facility]) (THIRD Sunday ONLY) 4:00pm served in the parking garage, across from Nucor Corporation, corner of [place] [address] and [place] by Ryland Group Works  Ministries MONDAYS BREAKFAST 7:30am served in [employer]or [employer] by the United States [employer] Corporation and Friends LUNCH 10:30am - 12:30pm served at Ecolab, Liberty Global, [place] [address] (1.2 miles from [facility]) DINNER TWO LOCATIONS: 7:00pm served in front of the courthouse at the corner of Goldman Sachs and International Business Machines. by [facility] Monday Night Meal (3 blocks from [facility]) 4:30pm served at the AutoNation, [address] by The Procter & Gamble Not Bombs (0.6 miles from [facility]) [place] BREAKFAST 8:00am - 9:00am served at The TJX Companies, [address] (0.3 miles from [place]) LUNCH 10:30am - 12:30pm served at the Ecolab, Liberty Global [address] (1.2 miles from [place]) DINNER 6:00pm served at CSX Corporation, enter from Capital One and go to the Sonic Automotive, (0.7 miles from [place]) [facility] BREAKFAST 7:00am - 8:00am served at Ecolab, Liberty Global [address] (1.2 miles from [place]) LUNCH ONE LOCATION [plus two additional locations listed below] 10:30am - 12:30pm served at Ecolab, Liberty Global [address], (1.2 miles from [place]) (FIRST Wednesday ONLY) 11:30am served at Dillard's, [place] [address] (6.6 miles from [place]) (SECOND Wednesday ONLY) 11:00am served at Edith Endave. Melvyn Novas of [address], [address] (1.3 miles from [place]) [place] TWO LOCATIONS 6:00pm served at W. R. Berkley, [place] [address] Viste[address] Corporation. (1.3 miles from [facility]) 4:00pm - 6:00pm (hot dogs and chips) served at Levi Strauss of [facility], [address] (1.7 miles from Home) [place] BREAKFAST NOT AVAILABLE AT THIS TIME LUNCH 10:30am - 12:30pm served at Ecolab, [place]  AT&T,  [phone] W[address], (1.2 miles from [place]) DINNER 6:00pm served at CSX Corporation, enter from Capital One and go to the Sonic Automotive, (0.7 miles from Nucor Corporation) [place] BREAKFAST NOT AVAILABLE AT THIS TIME LUNCH 10:30am - 12:30pm served at Ecolab, Liberty Global [address] (1.2 miles from [place]) DINNER TWO LOCATIONS, [plus one additional first Friday only] 6:00pm served under the bridge at [address] by Lindell Noe Under CSX Corporation. (.7 miles from [facility]) 5:00pm - 7:00pm served at Levi Strauss of [facility], [address] (1.7 miles from [place]) (FIRST Friday ONLY) 5:45 pm - SHUTTLE provided from the LIBRARY at 5:45pm. Served at [facility], [address]. SATURDAYS BREAKFAST TWO LOCATIONS [plus one additional last Saturday only] 8:00am served at [facility] by Delphi 8:30am served at Pulte Homes, [address]. (2.2 miles from [facility]) (LAST Saturday ONLY) 8:30am served at Beazer Homes, [address] (5 miles from [place]) LUNCH 10:30am - 12:30pm served at Ecolab, Liberty Global [address] (1.2 miles from [facility]) DINNER 6:00pm served under the bridge at [address] by World Fuel Services Corporation (0.7 miles from Nucor Corporation)  DIRECTIONS FROM [place] TO ALL MEAL LOCATIONS The Bridge at [address] (.7 miles from [address]) [address] on [place]. Turn Right onto DIRECTV 433 ft. Continue onto [address] under bridge, about 500 ft. Courthouse (3 blocks from [facility]) [place] on [address]. Turn right on [place] 1 block to PPL Corporation (.5 miles from [place]) [place] on [place]. YRC Worldwide. past Brink's Company to EMCOR. Enter from Capital One and  go to the Affiliated Computer Services building W. R. Berkley [address] Visteon [address]. (1.3 miles from [facility]) [address] on [place]. Turn Right onto [address]. [address]. church will be on the Left. The TJX Companies [address] (.3 miles from [facility]) Go .3 miles on W. Friend[address] Destination is on your right Dillard's at ONEOK (6.6 miles from [address]) [address] on [place] toward [address] Turn right onto [address] Continue onto Alcoa Inc. Continue onto Toll Brothers. 5. Elesa Hacker is on right [facility] Conseco) [address] (.6 miles from [address]) [place] on [place] Turn Left onto [address] 0.3 miles Destination is on the Left. [place] Black & Decker at American Express (5 miles from Nucor Corporation) 1. Head south on [address]. Turn right onto [address] Turn slightly left onto Quest Diagnostics Continue onto Quest Diagnostics Turn right at Barnes & Noble Continue to church on right [place] Sounds of [facility] [address] (1.7 miles from [place]) [address] on [place] 1.4 miles [place] becomes [place]. [address] Continue 0.6 miles and church will be on theright. [place] Guardian Life Insurance at [address] (2.5 miles from Nucor Corporation) [employer] provided from Massachusetts Mutual Life Park] [place] on [place]. Elm toward Estée Lauder right onto Costco Wholesale left onto Henry Schein left onto Micron Technology [address] Southern Company (  2.2 miles from [facility]) [address] on [place] 1.4 miles [place] becomes [place]. El[address] Turn right onto [address] and church will be on the Left. Potter's House/[place] AT&T [address] (1.2 miles from [facility]) 1.Turn right onto [facility] 2.Turn left onto Rogue Jury 3.Reino Kent 4.Destination is on your right [place]. Melvyn Novas of [address] at General Mills (1.3 miles from [facility]) [address] on [address] Turn left onto Genuine Parts right onto [address]. [place]. Continue onto KB Home	[place]. Turn left onto Smurfit-Stone Container. Turn right onto WellPoint.

## 2023-05-24 NOTE — Progress Notes (Signed)
PROGRESS NOTE  Lauren [name]  UJW:[id number] DOB: 1980/09/06 DOA: 05/17/2023 PCP: Billie Lade, MD   Brief Narrative: Patient is a 43 y.o. 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   She had recent admits 04/21/23- 04/24/23 for DKA and UTI and 03/2023 with seizure, DKA, and AKI.  On presentation, she was  afebrile with soft BP, lab showed  WBC normal at 8.2k. AKI with Cr of  2.7 from baseline ~1.9. She had redness to the right foot, XR without significant osseous finding.MRI of the right foot 1/15 suggested acute osteomyelitis for which IV antibiotics were started, blood cultures ordered. Case discussed with the patient's podiatrist who suggested formal evaluation as inpatient for possible debridement, so transferred to [facility] from [facility].  Vascular surgery also consulted as per podiatry  recommendation.  PT recommending SNF on discharge.Podiatry planning for OR tomorrow  Assessment & Plan:  Principal Problem:   Acute kidney injury superimposed on stage 4 chronic kidney disease (HCC) Active Problems:   Essential hypertension   Obesity, Class II, BMI 35-39.9   Acquired hypothyroidism   Hypokalemia   Atypical chest pain   Elevated troponin   GERD (gastroesophageal reflux disease)   Dehydration   Type 2 diabetes mellitus with hypoglycemia (HCC)   Thrombocytosis   Closed fracture of left proximal humerus   Cellulitis of right lower extremity  Right foot cellulitis/myositis/acute osteomyelitis: Currently on linezolid, ceftriaxone.  Blood cultures have been negative so far.MRI right foot showed  cortical erosion and marrow edema consistent with acute osteomyelitis of the left great toe.  Podiatry planning for right great toe bone biopsy, superficial wound debridement tomorrow.  If osteomyelitis is confirmed, may need further surgical procedures including amputation of the right great toe.  Vascular surgery  surgery planning for RLE angiogram  with CO2 on Thursday  Type 1 diabetes: On insulin pump at home.  Monitor blood sugars.  Continue current insulin regimen  AKI on CKD stage IV: Baseline creatinine around 1.9.  Presented with AKI.  Currently kidney function at baseline.  On sodium bicarb  Acute metabolic encephalopathy: Has short term  memory problem.  Continue delirium precaution, frequent reorientation.  Intermittently confused.  Currently alert oriented.  Normocytic anemia: Given unit of blood transfusion here.  Hemoglobin in the range of 7.  Also given a dose of IV iron. Likely from CKD  History of CVA/carotid stenosis: Right hemisphere encephalomalacia noted on CT head with stent in right ICA.  Coumadin was placed on hold for upcoming procedure.  Given vitamin EE to lower back INR to facilitate lower procedure.  Has chronic left hemiparesis, weakness more on the left upper extremity with chronic edema  Chronic combined HFrEF, HTN: Echo within the past month showed LVEF 30-35%. Continue metoprolol, hydralazine, isosorbide. Not on ACE/ARB/ARNI due to advanced CKD.  Coronary artery disease: On Plavix, statin, beta-blocker at home.  Continuing to follow-up with cardiology as an outpatient.  No anginal symptoms  Left humeral  fracture: Remains on sling.  Continue pain management.  Dr. Romeo Apple, orthopedics recommended outpatient follow-up in 2 weeks.  Has chronic edema of left upper extremity  Hypothyroidism: Continue Synthyroid  GERD: Continue PPI  Seizure disorder: on Topamax  Recurrent falls/debility: PT/OT recommending SNF on discharge. Has left hemiparesis  from  CVA          DVT prophylaxis:SCDs Start: 05/18/23 0158     Code Status: Limited: Do not attempt resuscitation (DNR) -DNR-LIMITED -Do Not Intubate/DNI   Family Communication: None at the bedside  Patient status:Inpatient  Patient is from :home  Anticipated discharge to:SNF  Estimated DC date: After full workup from  vascular surgery, podiatry   Consultants: Vascular surgery, podiatry  Procedures: None yet  Antimicrobials:  Anti-infectives (From admission, onward)    Start     Dose/Rate Route Frequency Ordered Stop   05/20/23 1400  vancomycin (VANCOREADY) IVPB 1250 mg/250 mL  Status:  Discontinued        1,250 mg 166.7 mL/hr over 90 Minutes Intravenous Every 48 hours 05/18/23 1337 05/19/23 1230   05/19/23 2200  linezolid (ZYVOX) IVPB 600 mg        600 mg 300 mL/hr over 60 Minutes Intravenous Every 12 hours 05/19/23 1230     05/19/23 1400  cefTRIAXone (ROCEPHIN) 2 g in sodium chloride 0.9 % 100 mL IVPB        2 g 200 mL/hr over 30 Minutes Intravenous Every 24 hours 05/19/23 1230     05/18/23 1430  vancomycin (VANCOREADY) IVPB 2000 mg/400 mL        2,000 mg 200 mL/hr over 120 Minutes Intravenous  Once 05/18/23 1331 05/18/23 2303   05/18/23 1430  ceFEPIme (MAXIPIME) 2 g in sodium chloride 0.9 % 100 mL IVPB  Status:  Discontinued        2 g 200 mL/hr over 30 Minutes Intravenous Every 12 hours 05/18/23 1338 05/19/23 1230       Subjective: Seen and examined at the bedside this afternoon.  She was hemodynamically stable, sitting in the chair.  Denies any significant pain on the right lower extremity.  Right foot carefully examined.  Has some edema, erythema and some drainage.  Plan for or tomorrow.  Objective: Vitals:   05/23/23 1434 05/23/23 2047 05/24/23 0444 05/24/23 0837  BP: 130/82 (!) 133/93 (!) 147/89 139/86  Pulse: 80 79 97 93  Resp: 16 19 17 16   Temp:  98 F (36.7 C) 97.7 F (36.5 C)   TempSrc:  Oral Oral   SpO2: 100% 100% 100% 98%  Weight:       Height:        Intake/Output Summary (Last 24 hours) at 05/24/2023 1441 Last data filed at 05/24/2023 0900 Gross per 24 hour  Intake 460 ml  Output 400 ml  Net 60 ml   Filed Weights   05/17/23 1701 05/18/23 0639 05/21/23 2016  Weight: 100 kg 94 kg 95.6 kg    Examination:  General exam: Overall comfortable, not in distress HEENT: PERRL Respiratory system:  no wheezes or crackles  Cardiovascular system: S1 & S2 heard, RRR.  Gastrointestinal system: Abdomen is nondistended, soft and nontender. Central nervous system: Alert and oriented Extremities: Left upper extremity edema, sling on left upper extremity, no clubbing ,no cyanosis Skin: Erythema, edema of right foot   Data Reviewed: I have personally reviewed following labs and imaging studies  CBC: Recent Labs  Lab 05/17/23 1745 05/18/23 0426 05/21/23 0449 05/22/23 0603 05/22/23 1042 05/23/23 0625 05/23/23 1103 05/24/23 0623  WBC 8.5   < > 6.9 7.5 6.5 7.7  --  8.2  NEUTROABS 5.7  --   --   --   --   --   --   --  HGB 9.2*   < > 7.1* 7.4* 9.0* 7.4* 7.8* 7.8*  HCT 29.6*   < > 23.9* 24.2* 29.9* 24.1* 26.1* 26.2*  MCV 90.2   < > 89.8 89.3 90.1 88.9  --  90.7  PLT 595*   < > 377 369 425* 330  --  360   < > = values in this interval not displayed.   Basic Metabolic Panel: Recent Labs  Lab 05/20/23 0517 05/21/23 0449 05/22/23 0603 05/23/23 0625 05/24/23 0623  NA 137 139 136 139 138  K 3.7 3.4* 4.4 4.0 3.7  CL 105 108 106 107 111  CO2 23 22 18* 20* 18*  GLUCOSE 213* 204* 409* 268* 191*  BUN 27* 25* 22* 23* 19  CREATININE 2.04* 1.92* 2.26* 2.44* 2.18*  CALCIUM 8.0* 7.9* 7.8* 8.1* 8.1*     Recent Results (from the past 240 hours)  Culture, blood (Routine X 2) w Reflex to ID Panel     Status: None   Collection Time: 05/19/23 12:17 PM   Specimen: BLOOD  Result Value Ref Range Status   Specimen Description BLOOD BLOOD RIGHT ARM  Final   Special Requests   Final    BOTTLES DRAWN AEROBIC ONLY Blood Culture  results may not be optimal due to an inadequate volume of blood received in culture bottles   Culture   Final    NO GROWTH 5 DAYS Performed at [facility], [address] [place], [place] [address]    Report Status 05/24/2023 FINAL  Final  Culture, blood (Routine X 2) w Reflex to ID Panel     Status: None   Collection Time: 05/19/23 12:21 PM   Specimen: BLOOD  Result Value Ref Range Status   Specimen Description BLOOD BLOOD RIGHT ARM  Final   Special Requests   Final    BOTTLES DRAWN AEROBIC AND ANAEROBIC Blood Culture adequate volume   Culture   Final    NO GROWTH 5 DAYS Performed at [facility], [address] [place], [place] [address]    Report Status 05/24/2023 FINAL  Final     Radiology Studies: VAS [name] UPPER EXTREMITY VENOUS DUPLEX Result Date: 05/24/2023 UPPER VENOUS STUDY  Patient Name:  YOLTZIN STARKEY  Date of Exam:   05/23/2023 Medical Rec #: [id number]        Accession #:    [id number] Date of Birth: [birth year]        Patient Gender: F Patient Age:   11 years Exam Location:  [facility] Procedure:      VAS [name] UPPER EXTREMITY VENOUS DUPLEX Referring Phys: Hazeline Junker --------------------------------------------------------------------------------  Indications: Pain, and Left humerus fracture, hemiparesis Limitations: Non compliance, confusion, pain. Comparison Study: Prior left upper extremity venous done 04/24/23 at [facility] Performing Technologist: Sherren Kerns RVS  Examination Guidelines: A complete evaluation includes B-mode imaging, spectral Doppler, color Doppler, and power Doppler as needed of all accessible portions of each vessel. Bilateral testing is considered an integral part of a complete examination. Limited examinations for reoccurring indications may be performed as noted.  Left Findings: +----------+------------+---------+-----------+----------+---------------+ LEFT      CompressiblePhasicitySpontaneousProperties    Summary      +----------+------------+---------+-----------+----------+---------------+ IJV                                                 Not visualized  +----------+------------+---------+-----------+----------+---------------+ Subclavian  Not visualized  +----------+------------+---------+-----------+----------+---------------+ Axillary                 Yes       Yes                              +----------+------------+---------+-----------+----------+---------------+ Brachial                 Yes       Yes                              +----------+------------+---------+-----------+----------+---------------+ Radial                                              patent by color +----------+------------+---------+-----------+----------+---------------+ Ulnar                                               Not visualized  +----------+------------+---------+-----------+----------+---------------+ Cephalic      Full                                                  +----------+------------+---------+-----------+----------+---------------+ Basilic                                             Not visualized  +----------+------------+---------+-----------+----------+---------------+  Summary:  Left: No evidence of deep vein thrombosis or superficial vein thrombosis in the visualized veins of the upper extremity. Study limited, see tech notes above.  *See table(s) above for measurements and observations.  Diagnosing physician: Carolynn Sayers Electronically signed by Carolynn Sayers on 05/24/2023 at 7:44:23 AM.    Final    VAS [name] ABI WITH/WO TBI Result Date: 05/24/2023  LOWER EXTREMITY DOPPLER STUDY Patient Name:  DEATRA NAJAR  Date of Exam:   05/23/2023 Medical Rec #: [id number]        Accession #:    [id number] Date of Birth: 06-30-1980        Patient Gender: F Patient Age:   36 years Exam Location:  [facility] Procedure:      VAS [name]  ABI WITH/WO TBI Referring Phys: Lelon Mast RHYNE --------------------------------------------------------------------------------  Indications: Peripheral artery disease. High Risk Factors: Hypertension, hyperlipidemia, Diabetes, past history of                    smoking, coronary artery disease. Other Factors: History of stroke with residual left side hemiparesis.  Vascular Interventions: Left anterior tibial, dorsalis pedis, and mid SFA                         angioplasty with stent to mid SFA 06/10/22. Right ICA                         stent. Right brachial, radial, and ulnar artery  thrombectomy 11/18/21. 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 Limitations: Today's exam was limited due to involuntary patient movement,              edema, patient positioning and patient intolerant to cuff pressure. Comparison Study: Prior ABI done 11/30/22 Performing Technologist: Sherren Kerns RVS  Examination Guidelines: A complete evaluation includes at minimum, Doppler waveform signals and systolic blood pressure reading at the level of bilateral brachial, anterior tibial, and posterior tibial arteries, when vessel segments are accessible. Bilateral testing is considered an integral part of a complete examination. Photoelectric Plethysmograph (PPG) waveforms and toe systolic pressure readings are included as required and additional duplex testing as needed. Limited examinations for reoccurring indications may be performed as noted.  ABI Findings: +---------+------------------+-----+--------+--------+ Right    Rt Pressure (mmHg)IndexWaveformComment  +---------+------------------+-----+--------+--------+ Brachial 153                    biphasic         +---------+------------------+-----+--------+--------+ PTA      254               1.66 biphasic         +---------+------------------+-----+--------+--------+ DP       254               1.66 biphasic          +---------+------------------+-----+--------+--------+ Great Toe78                0.51 Abnormal         +---------+------------------+-----+--------+--------+ +---------+------------------+-----+---------+----------+ Left     Lt Pressure (mmHg)IndexWaveform Comment    +---------+------------------+-----+---------+----------+ Brachial                                 restricted +---------+------------------+-----+---------+----------+ PTA      89                0.58 biphasic            +---------+------------------+-----+---------+----------+ DP       121               0.79 triphasic           +---------+------------------+-----+---------+----------+ Great Toe80                0.52 Abnormal            +---------+------------------+-----+---------+----------+ +-------+-------------+-----------+----------------+------------+ ABI/TBIToday's ABI  Today's TBIPrevious ABI    Previous TBI +-------+-------------+-----------+----------------+------------+ Right  1.66 non comp0.51       Non compressible0.56         +-------+-------------+-----------+----------------+------------+ Left   0.79         0.52       1.07 monophasic 0.55         +-------+-------------+-----------+----------------+------------+ Arterial wall calcification precludes accurate ankle pressures and ABIs. Right ABIs and bilateral TBIs appear essentially unchanged compared to prior study on 11/30/22. Left ABIs appear decreased compared to prior study on 11/30/22.  Summary: Right: Resting right ankle-brachial index indicates noncompressible right lower extremity arteries. The right toe-brachial index is abnormal. Left: Resting left ankle-brachial index indicates moderate left lower extremity arterial disease. The left toe-brachial index is abnormal. *See table(s) above for measurements and observations.  Electronically signed by Carolynn Sayers on 05/24/2023 at 7:44:12 AM.    Final    VAS [name] LOWER EXTREMITY  ARTERIAL DUPLEX Result Date: 05/24/2023 LOWER EXTREMITY ARTERIAL DUPLEX STUDY Patient Name:  TRUDEE ORTEGA  Date of Exam:   05/23/2023  Medical Rec #: [id number]        Accession #:    [id number] Date of Birth: 11-02-1980        Patient Gender: F Patient Age:   22 years Exam Location:  [facility] Procedure:      VAS [name] LOWER EXTREMITY ARTERIAL DUPLEX Referring Phys: Lelon Mast RHYNE --------------------------------------------------------------------------------  Indications: Peripheral artery disease. High Risk Factors: Hypertension, hyperlipidemia, Diabetes, past history of                    smoking, prior CVA.  Vascular Interventions: Left ATA, DPA, and mid SFA angioplasty with stenting to                         the mid SFA 06/10/22. Current ABI:            R: non comp/0.56 L:0.79/0.55 Limitations: Patient constantly moving, complaining of pain with scan,              significantly edematous left lower extremity. Comparison Study: Prior left LEA done 11/30/22 Performing Technologist: Sherren Kerns RVS  Examination Guidelines: A complete evaluation includes B-mode imaging, spectral Doppler, color Doppler, and power Doppler as needed of all accessible portions of each vessel. Bilateral testing is considered an integral part of a complete examination. Limited examinations for reoccurring indications may be performed as noted.   +----------+--------+-----+--------+----------+--------+ LEFT      PSV cm/sRatioStenosisWaveform  Comments +----------+--------+-----+--------+----------+--------+ CFA Prox  51                   biphasic           +----------+--------+-----+--------+----------+--------+ DFA       35                   monophasic         +----------+--------+-----+--------+----------+--------+ SFA Prox  107                  biphasic           +----------+--------+-----+--------+----------+--------+ SFA Mid                                  stent     +----------+--------+-----+--------+----------+--------+ SFA Distal65                   monophasic         +----------+--------+-----+--------+----------+--------+ POP Prox  50                   monophasic         +----------+--------+-----+--------+----------+--------+ POP Mid   113                  monophasic         +----------+--------+-----+--------+----------+--------+ POP Distal68                   monophasic         +----------+--------+-----+--------+----------+--------+ ATA Distal28                   monophasic         +----------+--------+-----+--------+----------+--------+ PTA Prox  53                   monophasic         +----------+--------+-----+--------+----------+--------+ Significant edema noted throughout left lower extremity, limiting imaging.  Left Stent(s): +--------------+--++--------++ Prox to Stent 88biphasic +--------------+--++--------++  Proximal Stent86biphasic +--------------+--++--------++ Mid Stent     87         +--------------+--++--------++    Summary: Left: Mid SFA stent patent, biphasic waveforms noted. Collaterals noted in the proximal and distal calf. Waveforms become monophasic in the distal SFA, popliteal, and the visualized portions of the PT and AT arteries.  See table(s) above for measurements and observations. Electronically signed by Carolynn Sayers on 05/24/2023 at 7:43:44 AM.    Final     Scheduled Meds:  atorvastatin  40 mg Oral Daily   clopidogrel  75 mg Oral Daily   hydrALAZINE  25 mg Oral BID   insulin aspart  0-9 Units Subcutaneous TID WC   insulin aspart  5 Units Subcutaneous TID WC   insulin glargine-yfgn  10 Units Subcutaneous BID   isosorbide mononitrate  60 mg Oral Daily   levothyroxine  125 mcg Oral Q0600   metoprolol succinate  50 mg Oral Daily   pantoprazole  40 mg Oral Daily   phytonadione  1 mg Oral Once   sodium bicarbonate  650 mg Oral BID   topiramate  50 mg Oral BID   Warfarin -  Pharmacist Dosing Inpatient   Does not apply q1600   Continuous Infusions:  cefTRIAXone (ROCEPHIN)  IV 2 g (05/22/23 1352)   linezolid (ZYVOX) IV 600 mg (05/24/23 1022)     LOS: 7 days   Burnadette Pop, MD [facility] P1/21/2025, 2:41 PM

## 2023-05-24 NOTE — Progress Notes (Signed)
Lauren [name] [phone]), daughter, to be here at 0730 AM 05/25/2023 to sign the informed consents for procedures.

## 2023-05-24 NOTE — Progress Notes (Signed)
PHARMACY - ANTICOAGULATION CONSULT NOTE  Pharmacy Consult for warfarin Indication: CVA  Allergies  Allergen Reactions   Visipaque [Iodixanol] Nausea And Vomiting    Patient had vagal response during procedure became hypertensive, and bradycardic. CO2 used during procedure prior to contrast being used, this may be cause as well. Recommended premedicating prior to contrast being used in the future.    Wellbutrin [Bupropion] Hives   Gabapentin     dizzy   Ciprofloxacin Hcl Hives and Rash    Hives/rash at injection site; 01/15/22 tolerated IV cipro   Tape Rash    Patient Measurements: Height: 5\' 5"  (165.1 cm) Weight: 95.6 kg (210 lb 12.2 oz) IBW/kg (Calculated) : 57 Heparin Dosing Weight:   Vital Signs: Temp: 97.7 F (36.5 C) (01/21 0444) Temp Source: Oral (01/21 0444) BP: 139/86 (01/21 0837) Pulse Rate: 93 (01/21 0837)  Labs: Recent Labs    05/22/23 0603 05/22/23 1042 05/23/23 0625 05/23/23 1103 05/24/23 0623  HGB 7.4* 9.0* 7.4* 7.8* 7.8*  HCT 24.2* 29.9* 24.1* 26.1* 26.2*  PLT 369 425* 330  --  360  LABPROT 28.2* 26.2* 30.5*  --  21.9*  INR 2.6* 2.4* 2.9*  --  1.9*  CREATININE 2.26*  --  2.44*  --  2.18*    Estimated Creatinine Clearance: 39.2 mL/min (A) (by C-G formula based on SCr of 2.18 mg/dL (H)).   Medical History: Past Medical History:  Diagnosis Date   Acute ischemic stroke (HCC) 01/01/2020   Acute kidney injury superimposed on CKD (HCC) 01/01/2020   Acute right MCA stroke (HCC) 01/28/2022   Anemia    CAD (coronary artery disease)    a. s/p cath in 03/2014 showing 30% mid-LAD, moderate to severe disease along small D1, patent LCx, moderate to severe distal OM2 stenosis and moderate diffuse diease along RCA not amenable to PCI   Cerebrovascular accident (CVA) (HCC) 12/31/2019   CHF (congestive heart failure) (HCC)    a. EF 55-60% in 12/2019 b. EF at 35-40% by echo in 05/2020   CKD (chronic kidney disease) stage 4, GFR 15-29 ml/min (HCC)    Diabetes  mellitus without complication (HCC)    Myocardial infarction (HCC)    Neuropathy    PVD (peripheral vascular disease) (HCC)    Stroke (HCC) 01/2022   L hand weakness   Assessment: Pharmacy consulted to dose warfarin in patient with history of CVA. Home dose listed as 2.5 mg on Tue/Thu/Sat and 5 mg ROW with last dose listed as 1/12.  1/20 INR up to 2.9, mild nosebleed noted 1/19, no further nosebleeds. Called by Dr. Nelson Chimes and podiatry would like INR </= 1.5 in order to take for bone biopsy. Will need to hold coumadin and will give small dose of oral vit K 2.5mg  today.  1/21 INR down to 1.9, okayed with Dr. Renford Dills to give another oral dose of vit K 1mg  today  Goal of Therapy:  INR 2-3 Monitor platelets by anticoagulation protocol: Yes   Plan:  Hold warfarin Vitamin K 1mg  po today Monitor daily INR and s/s of bleeding  Christoper Fabian, PharmD, BCPS Please see amion for complete clinical pharmacist phone list 05/24/2023 11:41 AM

## 2023-05-24 NOTE — Plan of Care (Signed)
  Problem: Nutritional: Goal: Maintenance of adequate nutrition will improve Outcome: Progressing   Problem: Clinical Measurements: Goal: Will remain free from infection Outcome: Progressing   Problem: Coping: Goal: Level of anxiety will decrease Outcome: Progressing

## 2023-05-24 NOTE — Progress Notes (Signed)
Mobility Specialist Progress Note:   05/24/23 1219  Mobility  Activity Transferred from chair to bed  Level of Assistance Contact guard assist, steadying assist  Assistive Device None  Distance Ambulated (ft) 20 ft  LUE Weight Bearing Per Provider Order NWB  Activity Response Tolerated well  Mobility Referral Yes  Mobility visit 1 Mobility  Mobility Specialist Start Time (ACUTE ONLY) 0930  Mobility Specialist Stop Time (ACUTE ONLY) 0945  Mobility Specialist Time Calculation (min) (ACUTE ONLY) 15 min   Pt received in chair, agreeable to mobility. Pt displayed slight unsteady gait but no LOB present. Pt c/o shoulder pain during session, otherwise asx throughout. Pt requesting to return to bed d/t fatigue. Pt left in bed with call bell in reach and all needs met. Bed alarm on.   Leory Plowman  Mobility Specialist Please contact via [employer] Rehab office at [phone]

## 2023-05-24 NOTE — Progress Notes (Signed)
  Progress Note    05/24/2023 7:47 AM * Surgery not performed *  Subjective:  says she has to go to the bathroom. Denies any pain currently    Vitals:   05/23/23 2047 05/24/23 0444  BP: (!) 133/93 (!) 147/89  Pulse: 79 97  Resp: 19 17  Temp: 98 F (36.7 C) 97.7 F (36.5 C)  SpO2: 100% 100%    Physical Exam: General:  laying in bed comfortably, NAD Lungs:  nonlabored Extremities:  BLE edematous. Right foot wounds dry   CBC    Component Value Date/Time   WBC 8.2 05/24/2023 0623   RBC 2.89 (L) 05/24/2023 0623   HGB 7.8 (L) 05/24/2023 0623   HGB 12.3 02/18/2023 1032   HCT 26.2 (L) 05/24/2023 0623   HCT 38.4 02/18/2023 1032   PLT 360 05/24/2023 0623   PLT 347 02/18/2023 1032   MCV 90.7 05/24/2023 0623   MCV 93 02/18/2023 1032   MCH 27.0 05/24/2023 0623   MCHC 29.8 (L) 05/24/2023 0623   RDW 17.1 (H) 05/24/2023 0623   RDW 14.0 02/18/2023 1032   LYMPHSABS 1.9 05/17/2023 1745   LYMPHSABS 1.9 08/11/2022 1510   MONOABS 0.7 05/17/2023 1745   EOSABS 0.2 05/17/2023 1745   EOSABS 0.1 08/11/2022 1510   BASOSABS 0.0 05/17/2023 1745   BASOSABS 0.1 08/11/2022 1510    BMET    Component Value Date/Time   NA 138 05/24/2023 0623   NA 140 08/11/2022 1511   K 3.7 05/24/2023 0623   CL 111 05/24/2023 0623   CO2 18 (L) 05/24/2023 0623   GLUCOSE 191 (H) 05/24/2023 0623   BUN 19 05/24/2023 0623   BUN 37 (H) 08/11/2022 1511   CREATININE 2.18 (H) 05/24/2023 0623   CALCIUM 8.1 (L) 05/24/2023 0623   GFRNONAA 29 (L) 05/24/2023 0623   GFRAA 31 (L) 02/01/2020 1328    INR    Component Value Date/Time   INR 1.9 (H) 05/24/2023 0623   INR 3.4 09/30/2022 0911     Intake/Output Summary (Last 24 hours) at 05/24/2023 0747 Last data filed at 05/24/2023 0600 Gross per 24 hour  Intake 460 ml  Output --  Net 460 ml      Assessment/Plan:  43 y.o. female with possible right foot osteomyelitis   -Continues to have bilateral lower extremity edema. Right foot still cool to touch  with several small wounds -Noninvasive studies performed yesterday demonstrate a patent left SFA stent. No tissue loss on the left -ABIs on the right are still noncompressible. GT pressure slightly reduced from to since her last studies. We have discussed with the patient that these findings suggest marginal wound healing capacity -She would benefit from RLE angiogram to optimize her blood flow and increase chances of right foot wound healing. She is agreeable to this. This will likely be done with CO2 given her underlying CKD with Scr of 2.18 -Plans for possible bone biopsy today with podiatry to confirm right foot osteo -Her Coumadin is currently on hold. INR today is 1.9. Continue to hold coumadin. Will plan for RLE angiogram with CO2 with Dr.Clark on Thursday   [facility], [facility] [phone] 05/24/2023 7:47 AM

## 2023-05-24 NOTE — Evaluation (Signed)
Speech Language Pathology Evaluation Patient Details Name: Lauren [name] MRN: [id number] DOB: [birth year] Today's Date: 05/24/2023 Time: 1150-1200 SLP Time Calculation (min) (ACUTE ONLY): 10 min  Problem List:  Patient Active Problem List   Diagnosis Date Noted   Cellulitis of right lower extremity 05/24/2023   Dehydration 05/18/2023   Type 2 diabetes mellitus with hypoglycemia (HCC) 05/18/2023   Thrombocytosis 05/18/2023   Closed fracture of left proximal humerus 05/18/2023   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) 05/11/2023   Hemiparesis affecting left side as late effect of cerebrovascular accident (CVA) (HCC) 05/03/2023   Left humeral fracture 05/02/2023   Elevated LFTs 05/02/2023   SEMI (subendocardial myocardial infarction) (HCC) 04/30/2023   DKA (diabetic ketoacidosis) (HCC) 04/21/2023   GERD (gastroesophageal reflux disease) 04/21/2023   Acute metabolic encephalopathy 04/21/2023   Hypoalbuminemia due to protein-calorie malnutrition (HCC) 04/21/2023   Prolonged QT interval 04/21/2023   Seizure (HCC) 03/13/2023   Behavior concern in adult 03/06/2023   Chest pain due to CAD (HCC) 11/07/2022   Dizziness 10/11/2022   History of blood clots 09/29/2022   Abnormal TSH 09/13/2022   Lung nodule seen on imaging study 08/11/2022   Chronic nausea 08/02/2022   PVD (peripheral vascular disease) (HCC) 07/19/2022   DNR (do not resuscitate) 07/19/2022   Insomnia 04/07/2022   Ischemic cardiomyopathy    Elevated troponin    HFrEF (heart failure with reduced ejection fraction) /chronic systolic dysfunction CHF with EF of 30 to 35%    Right carotid artery occlusion 01/08/2022   Internal carotid artery occlusion, 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   Menorrhagia with irregular cycle 12/15/2021   Foot ulcer (HCC) 08/27/2021   Hypokalemia 08/27/2021   Chronic combined systolic and diastolic heart failure (HCC) 06/18/2021   History of CVA (cerebrovascular accident) 06/18/2021    Menorrhagia with regular cycle 07/23/2020   Iron deficiency anemia due to chronic blood loss 07/23/2020   Menstrual cramps 07/23/2020   Obesity, Class II, BMI 35-39.9 06/18/2020   CAD (coronary artery disease) 06/18/2020   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) - baseline Scr 2.8-3.0 06/18/2020   Angina at rest [facility]) 06/18/2020   Mixed hyperlipidemia    Class 2 obesity due to excess calories without serious comorbidity with body mass index (BMI) of 39.0 to 39.9 in adult    Acute kidney injury superimposed on stage 4 chronic kidney disease (HCC) 01/01/2020   Tobacco abuse 01/01/2020   Atypical chest pain 12/31/2019   Left sided numbness 12/31/2019   Vitamin D deficiency 07/24/2015   Essential hypertension 03/17/2015   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) 03/07/2015   Acquired hypothyroidism 03/07/2015   Past Medical History:  Past Medical History:  Diagnosis Date   Acute ischemic stroke (HCC) 01/01/2020   Acute kidney injury superimposed on CKD (HCC) 01/01/2020   Acute right MCA stroke (HCC) 01/28/2022   Anemia    CAD (coronary artery disease)    a. s/p cath in 03/2014 showing 30% mid-LAD, moderate to severe disease along small D1, patent LCx, moderate to severe distal OM2 stenosis and moderate diffuse diease along RCA not amenable to PCI   Cerebrovascular accident (CVA) (HCC) 12/31/2019   CHF (congestive heart failure) (HCC)    a. EF 55-60% in 12/2019 b. EF at 35-40% by echo in 05/2020   CKD (chronic kidney disease) stage 4, GFR 15-29 ml/min (HCC)    Diabetes mellitus without complication (HCC)    Myocardial infarction (HCC)    Neuropathy  PVD (peripheral vascular disease) (HCC)    Stroke (HCC) 01/2022   L hand weakness   Past Surgical History:  Past Surgical History:  Procedure Laterality Date   ABDOMINAL AORTOGRAM W/LOWER EXTREMITY N/A 06/10/2022   Procedure: ABDOMINAL AORTOGRAM W/LOWER EXTREMITY;  Surgeon: Cephus Shelling, MD;  [facility]:  [facility];  Service: Cardiovascular;  Laterality: N/A;   AMPUTATION Left 09/02/2021   Procedure: AMPUTATION RAY;  Surgeon: Edwin Cap, DPM;  Location: [facility] OR;  Service: Podiatry;  Laterality: Left;  sagittal saw, 3L bag saline & Pulse   Cardiac catherization     CHOLECYSTECTOMY     I & D EXTREMITY Left 08/30/2021   Procedure: IRRIGATION AND DEBRIDEMENT WITH BONE BIOPSY;  Surgeon: Candelaria Stagers, DPM;  Location: [facility] OR;  Service: Podiatry;  Laterality: Left;   IR ANGIO VERTEBRAL SEL SUBCLAVIAN INNOMINATE UNI L MOD SED  01/18/2022   IR CT HEAD LTD  01/08/2022   IR INTRA CRAN STENT  01/08/2022   IR PERCUTANEOUS ART THROMBECTOMY/INFUSION INTRACRANIAL INC DIAG ANGIO  01/08/2022   IRRIGATION AND DEBRIDEMENT FOOT Left 09/04/2021   Procedure: IRRIGATION AND DEBRIDEMENT FOOT AND CLOSURE;  Surgeon: Edwin Cap, DPM;  Location: [facility] OR;  Service: Podiatry;  Laterality: Left;   LEFT HEART CATH AND CORONARY ANGIOGRAPHY N/A 07/26/2022   Procedure: LEFT HEART CATH AND CORONARY ANGIOGRAPHY;  Surgeon: Swaz[place], Peter M, MD;  Location: [facility] INVASIVE CV LAB;  Service: Cardiovascular;  Laterality: N/A;   METATARSAL OSTEOTOMY Left 07/21/2022   Procedure: LEFT FOOT EXCISION OF FIFTH METATARSAL WITH DEBRIDEMENT OF ULCER, POSSIBLE BONE BIOPSY OF THE FOURTH METATARSAL, POSSIBLE FOURTH RAY AMPUTATION OF LEFT TOEAND METATARSAL;  Surgeon: Vivi Barrack, DPM;  Location: [facility];  Service: Podiatry;  Laterality: Left;   PERIPHERAL VASCULAR BALLOON ANGIOPLASTY Left 06/10/2022   Procedure: PERIPHERAL VASCULAR BALLOON ANGIOPLASTY;  Surgeon: Cephus Shelling, MD;  Location: [facility];  Service: Cardiovascular;  Laterality: Left;  AT and DP   PERIPHERAL VASCULAR INTERVENTION Left 06/10/2022   Procedure: PERIPHERAL VASCULAR INTERVENTION;  Surgeon: Cephus Shelling, MD;  Location: [facility] INVASIVE CV LAB;  Service: Cardiovascular;  Laterality: Left;  SFA   RADIOLOGY WITH ANESTHESIA N/A 01/08/2022    Procedure: IR WITH ANESTHESIA;  Surgeon: Radiologist, Medication, MD;  Location: [facility];  Service: Radiology;  Laterality: N/A;   THROMBECTOMY BRACHIAL ARTERY Right 11/18/2021   Procedure: RIGHT BRACHIAL, RADIAL, & ULNAR ARTERY THROMBECTOMY.;  Surgeon: Cephus Shelling, MD;  Location: [facility];  Service: Vascular;  Laterality: Right;   TUBAL LIGATION     HPI:  Lauren [name] is a 43 yo female presenting to ED 1/15 with chest pain. MRI of the R foot revealed possible acute osteomyelitis. Recently admitted for DKA, acute metabolic encephalopathy, seizure, and UTI 03/2023. Pt targeting L side attention, awareness, memory recall, and problem solving throughout stay in [facility] s/p CVA 01/2022. PMH includes T1DM, seizures, CVA with residual L hemipleigia, CKD stage 4, NSTEMI   Assessment / Plan / Recommendation Clinical Impression  Pt reports baseline deficits consistent with performance upon d/c from [facility] 02/18/22. She states she receives assistance with medication management, but is independent with finances. She is oriented x4. Pt has mild deficits related to L side attention, awareness, and memory which appear to be consistent with her baseline. Pt is agreeable to receiving SLP f/u upon d/c to maintain and promote ongoing improvement with current level of function. She may benefit from increased supervision upon d/c but has adamantly  expressed a desire to return home with her mother. Recommend ongoing SLP f/u at SNF vs [facility] with full supervision.    SLP Assessment  SLP Recommendation/Assessment: All further Speech Lanaguage Pathology  needs can be addressed in the next venue of care SLP Visit Diagnosis: Cognitive communication deficit (R41.841)    Recommendations for follow up therapy are one component of a multi-disciplinary discharge planning process, led by the attending physician.  Recommendations may be updated based on patient status, additional functional criteria and insurance authorization.     Follow Up Recommendations  Skilled nursing-short term rehab (<3 hours/day)    Assistance Recommended at Discharge  Frequent or constant Supervision/Assistance  Functional Status Assessment Patient has had a recent decline in their functional status and demonstrates the ability to make significant improvements in function in a reasonable and predictable amount of time.  Frequency and Duration           SLP Evaluation Cognition  Overall Cognitive Status: History of cognitive impairments - at baseline Arousal/Alertness: Awake/alert Orientation Level: Oriented X4 Attention: Sustained Sustained Attention: Appears intact Memory: Impaired Memory Impairment: Decreased recall of new information Awareness: Impaired Awareness Impairment: Emergent impairment Problem Solving: Impaired Problem Solving Impairment: Verbal basic;Functional basic       Comprehension  Auditory Comprehension Overall Auditory Comprehension: Appears within functional limits for tasks assessed    Expression Expression Primary Mode of Expression: Verbal Verbal Expression Overall Verbal Expression: Appears within functional limits for tasks assessed   Oral / Motor  Oral Motor/Sensory Function Overall Oral Motor/Sensory Function: Within functional limits Motor Speech Overall Motor Speech: Appears within functional limits for tasks assessed            Gwynneth Aliment, M.A., CF-SLP Speech Language Pathology, Acute Rehabilitation Services  Secure Chat preferred [phone]  05/24/2023, 12:47 PM

## 2023-05-24 NOTE — Progress Notes (Signed)
Occupational Therapy Treatment Patient Details Name: Lauren [name] MRN: [id number] DOB: 04/03/1981 Today's Date: 05/24/2023   History of present illness Pt is a 43 y.o. F who presents with reports of chest pain and  MRI of the right foot 05/17/2022 suggesting possible acute osteomyelitis. Recently admitted for DKA and acute metabolic encephalopathy, UTI and 03/2023 with seizure. Significant PMH: DM1, seizures, CVA, CKD stage 4, NSTEMI, recent left proximal humerus fx after fall 12/23.   OT comments  Patient making good gains with OT with transfers, LB dressing, and grooming standing at sink. Patient required assistance with adjusting sling upon entry. Patient will benefit from continued inpatient follow up therapy, <3 hours/day to continue to address functional transfers and self care but patient has expressed a desire to return home. Acute OT to continue to follow.       If plan is discharge home, recommend the following:  A little help with walking and/or transfers;A lot of help with bathing/dressing/bathroom;Assistance with cooking/housework;Direct supervision/assist for medications management;Direct supervision/assist for financial management;Help with stairs or ramp for entrance   Equipment Recommendations  None recommended by OT    Recommendations for Other Services      Precautions / Restrictions Precautions Precautions: Fall Required Braces or Orthoses: Sling Restrictions Weight Bearing Restrictions Per Provider Order: Yes LUE Weight Bearing Per Provider Order: Non weight bearing Other Position/Activity Restrictions: Remain in sling       Mobility Bed Mobility               General bed mobility comments: OOB in recliner    Transfers Overall transfer level: Needs assistance Equipment used: 1 person hand held assist Transfers: Sit to/from Stand Sit to Stand: Min assist           General transfer comment: min assist to stand from recliner with cues for hand  placement     Balance Overall balance assessment: Needs assistance Sitting-balance support: No upper extremity supported, Feet supported Sitting balance-Leahy Scale: Good     Standing balance support: Single extremity supported, During functional activity Standing balance-Leahy Scale: Fair Standing balance comment: HHA +1                           ADL either performed or assessed with clinical judgement   ADL Overall ADL's : Needs assistance/impaired Eating/Feeding: Set up;Sitting   Grooming: Wash/dry hands;Wash/dry face;Brushing hair;Supervision/safety;Standing               Lower Body Dressing: Moderate assistance;Sitting/lateral leans Lower Body Dressing Details (indicate cue type and reason): to change socks Toilet Transfer: Minimal assistance;Ambulation Toilet Transfer Details (indicate cue type and reason): simulated in room           General ADL Comments: frequent cues for safety    Extremity/Trunk Assessment              Vision       Perception     Praxis      Cognition Arousal: Alert Behavior During Therapy: Flat affect Overall Cognitive Status: Difficult to assess                                 General Comments: follows commands, cues for safety        Exercises      Shoulder Instructions       General Comments VSS on RA    Pertinent Vitals/ Pain  Pain Assessment Pain Assessment: Faces Faces Pain Scale: Hurts little more Pain Location: feet Pain Descriptors / Indicators: Grimacing, Moaning Pain Intervention(s): Limited activity within patient's tolerance, Monitored during session, Repositioned  Home Living                                          Prior Functioning/Environment              Frequency  Min 1X/week        Progress Toward Goals  OT Goals(current goals can now be found in the care plan section)  Progress towards OT goals: Progressing toward  goals  Acute Rehab OT Goals Patient Stated Goal: go home OT Goal Formulation: With patient Time For Goal Achievement: 06/01/23 Potential to Achieve Goals: Good ADL Goals Pt Will Perform Grooming: with modified independence;standing Pt Will Perform Lower Body Bathing: sitting/lateral leans;sit to/from stand;with supervision Pt Will Perform Lower Body Dressing: sitting/lateral leans;sit to/from stand;with supervision Pt Will Transfer to Toilet: with modified independence;ambulating Pt Will Perform Toileting - Clothing Manipulation and hygiene: with modified independence;sitting/lateral leans;sit to/from stand  Plan      Co-evaluation                 AM-PAC OT "6 Clicks" Daily Activity     Outcome Measure   Help from another person eating meals?: A Little Help from another person taking care of personal grooming?: A Little Help from another person toileting, which includes using toliet, bedpan, or urinal?: A Lot Help from another person bathing (including washing, rinsing, drying)?: A Lot Help from another person to put on and taking off regular upper body clothing?: A Little Help from another person to put on and taking off regular lower body clothing?: A Lot 6 Click Score: 15    End of Session Equipment Utilized During Treatment: Gait belt  OT Visit Diagnosis: Unsteadiness on feet (R26.81);Other abnormalities of gait and mobility (R26.89);Muscle weakness (generalized) (M62.81)   Activity Tolerance Patient tolerated treatment well   Patient Left in chair;with call bell/phone within reach;with chair alarm set   Nurse Communication Mobility status        Time: 8295-6213 OT Time Calculation (min): 23 min  Charges: OT General Charges $OT Visit: 1 Visit OT Treatments $Self Care/Home Management : 23-37 mins  Alfonse Flavors, OTA Acute Rehabilitation Services  Office [phone]   Dewain Penning 05/24/2023, 10:30 AM

## 2023-05-24 NOTE — Plan of Care (Signed)
  Problem: Coping: Goal: Ability to adjust to condition or change in health will improve Outcome: Progressing   Problem: Metabolic: Goal: Ability to maintain appropriate glucose levels will improve Outcome: Progressing   Problem: Nutritional: Goal: Maintenance of adequate nutrition will improve Outcome: Progressing   Problem: Clinical Measurements: Goal: Will remain free from infection Outcome: Progressing   Problem: Activity: Goal: Risk for activity intolerance will decrease Outcome: Progressing   Problem: Coping: Goal: Level of anxiety will decrease Outcome: Progressing

## 2023-05-24 NOTE — Progress Notes (Signed)
PODIATRY PROGRESS NOTE Patient Name: Lauren [name]  DOB Mar 15, 1981 DOA 05/17/2023  Hospital Day: 8  Assessment:  43 y.o. female with history of poorly controlled type 1 diabetes, CVA on Coumadin, stage IV CKD, CAD and prior left partial fifth ray amputation with concern for cellulitis and possible osteomyelitis of the right great toe.  AF, VSS  WBC: 8.2  Wound/Bone Cultures: Pending all  Imaging: MRI right foot concern for possible osteomyelitis of the distal phalanx of the right great toe.  Plan:  -N.p.o. past midnight 4 OR tomorrow plan is right great toe bone biopsy and superficial wound debridement -Discussed that further surgical plans may be recommended if there is confirmed osteomyelitis in the right great toe.  This would be for amputation of the right great toe versus treatment with IV antibiotic therapy however that has a high chance of failure. - Appreciate vascular surgery.  Planning CO2 angiogram on Thursday -Patient is on warfarin and INR still high 1.9 today.  Working to be lowered to less than 1.5 prior to surgery tomorrow Will continue to follow        Corinna Gab, DPM [facility]    Subjective:  Patient seen bedside and discussed plan for possible bone biopsy tomorrow.  She is worried that bone biopsy will lead to amputation.  I discussed that we will only plan to do a bone biopsy tomorrow and this is important to get a better sense of if the bone is truly infected and if so what is called an infection.  She is agreeable to proceed.  I also informed her that the vascular surgery team is planning an angiogram of her right lower extremity on Thursday.  Objective:   Vitals:   05/24/23 0444 05/24/23 0837  BP: (!) 147/89 139/86  Pulse: 97 93  Resp: 17 16  Temp: 97.7 F (36.5 C)   SpO2: 100% 98%       Latest Ref Rng & Units 05/24/2023    6:23 AM 05/23/2023   11:03 AM 05/23/2023    6:25 AM  CBC  WBC 4.0 - 10.5 K/uL 8.2   7.7    Hemoglobin 12.0 - 15.0 g/dL 7.8  7.8  7.4   Hematocrit 36.0 - 46.0 % 26.2  26.1  24.1   Platelets 150 - 400 K/uL 360   330        Latest Ref Rng & Units 05/24/2023    6:23 AM 05/23/2023    6:25 AM 05/22/2023    6:03 AM  BMP  Glucose 70 - 99 mg/dL 664  403  474   BUN 6 - 20 mg/dL 19  23  22    Creatinine 0.44 - 1.00 mg/dL 2.59  5.63  8.75   Sodium 135 - 145 mmol/L 138  139  136   Potassium 3.5 - 5.1 mmol/L 3.7  4.0  4.4   Chloride 98 - 111 mmol/L 111  107  106   CO2 22 - 32 mmol/L 18  20  18    Calcium 8.9 - 10.3 mg/dL 8.1  8.1  7.8     General: AAOx3, NAD  Lower Extremity Exam Decreased erythema and edema to the right foot.  Small ulceration at the dorsal lateral base of the great toe.  Callus present at the plantar medial aspect of the right hallux IPJ. Edema present in the right foot. Nonpalpable DP and PT pulses on the bilateral lower extremity  Status post partial fifth ray amputation left  foot, no open wounds or erythema of the left foot   Radiology:  Results reviewed. See assessment for pertinent imaging results

## 2023-05-24 NOTE — Progress Notes (Signed)
   05/24/23 1311  SDOH Interventions  Transportation Interventions Inpatient TOC   Resources placed on AVS

## 2023-05-25 ENCOUNTER — Other Ambulatory Visit: Payer: Self-pay

## 2023-05-25 ENCOUNTER — Inpatient Hospital Stay (HOSPITAL_COMMUNITY): Payer: 59 | Admitting: Anesthesiology

## 2023-05-25 ENCOUNTER — Encounter (HOSPITAL_COMMUNITY): Payer: Self-pay | Admitting: Internal Medicine

## 2023-05-25 ENCOUNTER — Encounter (HOSPITAL_COMMUNITY)
Admission: EM | Discharge status: Against Medical Advice | Payer: Self-pay | Source: Home / Self Care | Attending: Internal Medicine

## 2023-05-25 DIAGNOSIS — M869 Osteomyelitis, unspecified: Secondary | ICD-10-CM

## 2023-05-25 DIAGNOSIS — E1169 Type 2 diabetes mellitus with other specified complication: Secondary | ICD-10-CM | POA: Diagnosis not present

## 2023-05-25 DIAGNOSIS — F17210 Nicotine dependence, cigarettes, uncomplicated: Secondary | ICD-10-CM

## 2023-05-25 DIAGNOSIS — I2510 Atherosclerotic heart disease of native coronary artery without angina pectoris: Secondary | ICD-10-CM | POA: Diagnosis not present

## 2023-05-25 DIAGNOSIS — L97512 Non-pressure chronic ulcer of other part of right foot with fat layer exposed: Secondary | ICD-10-CM

## 2023-05-25 DIAGNOSIS — N179 Acute kidney failure, unspecified: Secondary | ICD-10-CM | POA: Diagnosis not present

## 2023-05-25 DIAGNOSIS — N184 Chronic kidney disease, stage 4 (severe): Secondary | ICD-10-CM | POA: Diagnosis not present

## 2023-05-25 HISTORY — PX: BONE BIOPSY: SHX375

## 2023-05-25 LAB — CBC
HCT: 24.2 % — ABNORMAL LOW (ref 36.0–46.0)
Hemoglobin: 7.3 g/dL — ABNORMAL LOW (ref 12.0–15.0)
MCH: 27.2 pg (ref 26.0–34.0)
MCHC: 30.2 g/dL (ref 30.0–36.0)
MCV: 90.3 fL (ref 80.0–100.0)
Platelets: 320 10*3/uL (ref 150–400)
RBC: 2.68 MIL/uL — ABNORMAL LOW (ref 3.87–5.11)
RDW: 17 % — ABNORMAL HIGH (ref 11.5–15.5)
WBC: 8.5 10*3/uL (ref 4.0–10.5)
nRBC: 0 % (ref 0.0–0.2)

## 2023-05-25 LAB — GLUCOSE, CAPILLARY
Glucose-Capillary: 100 mg/dL — ABNORMAL HIGH (ref 70–99)
Glucose-Capillary: 87 mg/dL (ref 70–99)
Glucose-Capillary: 101 mg/dL — ABNORMAL HIGH (ref 70–99)
Glucose-Capillary: 240 mg/dL — ABNORMAL HIGH (ref 70–99)
Glucose-Capillary: 404 mg/dL — ABNORMAL HIGH (ref 70–99)

## 2023-05-25 LAB — PREPARE RBC (CROSSMATCH)

## 2023-05-25 LAB — BASIC METABOLIC PANEL
Anion gap: 9 (ref 5–15)
BUN: 16 mg/dL (ref 6–20)
CO2: 18 mmol/L — ABNORMAL LOW (ref 22–32)
Calcium: 8.2 mg/dL — ABNORMAL LOW (ref 8.9–10.3)
Chloride: 109 mmol/L (ref 98–111)
Creatinine, Ser: 1.89 mg/dL — ABNORMAL HIGH (ref 0.44–1.00)
GFR, Estimated: 34 mL/min — ABNORMAL LOW (ref >60)
Glucose, Bld: 87 mg/dL (ref 70–99)
Potassium: 3.7 mmol/L (ref 3.5–5.1)
Sodium: 136 mmol/L (ref 135–145)

## 2023-05-25 LAB — PROTIME-INR
INR: 1.3 — ABNORMAL HIGH (ref 0.8–1.2)
Prothrombin Time: 15.9 s — ABNORMAL HIGH (ref 11.4–15.2)

## 2023-05-25 SURGERY — BIOPSY, BONE
Anesthesia: Monitor Anesthesia Care | Laterality: Right

## 2023-05-25 MED ORDER — SODIUM CHLORIDE 0.9% FLUSH: 10.0000 mL | INTRAVENOUS | Status: DC | PRN

## 2023-05-25 MED ORDER — ACETAMINOPHEN 500 MG PO TABS: 1000.0000 mg | ORAL_TABLET | Freq: Once | ORAL | Status: DC | PRN

## 2023-05-25 MED ORDER — FENTANYL CITRATE (PF) 100 MCG/2ML IJ SOLN
25.0000 ug | INTRAMUSCULAR | Status: DC | PRN
Start: 2023-05-25 — End: 2023-05-25

## 2023-05-25 MED ORDER — FENTANYL CITRATE (PF) 250 MCG/5ML IJ SOLN
INTRAMUSCULAR | Status: AC
  Filled 2023-05-25: qty 5

## 2023-05-25 MED ORDER — SODIUM CHLORIDE 0.9% IV SOLUTION: Freq: Once | INTRAVENOUS | Status: DC

## 2023-05-25 MED ORDER — PHENYLEPHRINE 80 MCG/ML (10ML) SYRINGE FOR IV PUSH (FOR BLOOD PRESSURE SUPPORT)
PREFILLED_SYRINGE | INTRAVENOUS | Status: DC | PRN
  Administered 2023-05-25: 160 ug via INTRAVENOUS

## 2023-05-25 MED ORDER — LACTATED RINGERS IV SOLN: INTRAVENOUS | Status: DC

## 2023-05-25 MED ORDER — PROPOFOL 500 MG/50ML IV EMUL
INTRAVENOUS | Status: DC | PRN
  Administered 2023-05-25: 75 ug/kg/min via INTRAVENOUS

## 2023-05-25 MED ORDER — ACETAMINOPHEN 160 MG/5ML PO SOLN: 1000.0000 mg | Freq: Once | ORAL | Status: DC | PRN

## 2023-05-25 MED ORDER — INSULIN ASPART 100 UNIT/ML IJ SOLN: 0.0000 [IU] | INTRAMUSCULAR | Status: DC | PRN

## 2023-05-25 MED ORDER — MIDAZOLAM HCL 2 MG/2ML IJ SOLN
INTRAMUSCULAR | Status: DC | PRN
  Administered 2023-05-25: 2 mg via INTRAVENOUS

## 2023-05-25 MED ORDER — FERROUS SULFATE 325 (65 FE) MG PO TABS
325.0000 mg | ORAL_TABLET | Freq: Every day | ORAL | Status: DC
  Administered 2023-05-26 – 2023-05-28 (×3): 325 mg via ORAL
  Filled 2023-05-25 (×3): qty 1

## 2023-05-25 MED ORDER — CHLORHEXIDINE GLUCONATE CLOTH 2 % EX PADS
6.0000 | MEDICATED_PAD | Freq: Every day | CUTANEOUS | Status: DC
  Administered 2023-05-25 – 2023-05-28 (×4): 6 via TOPICAL

## 2023-05-25 MED ORDER — BUPIVACAINE HCL (PF) 0.5 % IJ SOLN
INTRAMUSCULAR | Status: DC | PRN
  Administered 2023-05-25: 10 mL

## 2023-05-25 MED ORDER — ONDANSETRON HCL 4 MG/2ML IJ SOLN
INTRAMUSCULAR | Status: DC | PRN
  Administered 2023-05-25: 4 mg via INTRAVENOUS

## 2023-05-25 MED ORDER — ACETAMINOPHEN 10 MG/ML IV SOLN: 1000.0000 mg | Freq: Once | INTRAVENOUS | Status: DC | PRN

## 2023-05-25 MED ORDER — MIDAZOLAM HCL 2 MG/2ML IJ SOLN
INTRAMUSCULAR | Status: AC
Start: 2023-05-25 — End: ?
  Filled 2023-05-25: qty 2

## 2023-05-25 MED ORDER — GLYCOPYRROLATE PF 0.2 MG/ML IJ SOSY
PREFILLED_SYRINGE | INTRAMUSCULAR | Status: AC
  Filled 2023-05-25: qty 1

## 2023-05-25 MED ORDER — BUPIVACAINE HCL (PF) 0.5 % IJ SOLN
INTRAMUSCULAR | Status: AC
  Filled 2023-05-25: qty 30

## 2023-05-25 MED ORDER — OXYCODONE HCL 5 MG PO TABS: 5.0000 mg | ORAL_TABLET | Freq: Once | ORAL | Status: DC | PRN

## 2023-05-25 MED ORDER — FENTANYL CITRATE (PF) 250 MCG/5ML IJ SOLN
INTRAMUSCULAR | Status: DC | PRN
  Administered 2023-05-25: 100 ug via INTRAVENOUS

## 2023-05-25 MED ORDER — OXYCODONE HCL 5 MG/5ML PO SOLN: 5.0000 mg | Freq: Once | ORAL | Status: DC | PRN

## 2023-05-25 MED ORDER — DEXAMETHASONE SODIUM PHOSPHATE 10 MG/ML IJ SOLN
INTRAMUSCULAR | Status: DC | PRN
  Administered 2023-05-25: 5 mg via INTRAVENOUS

## 2023-05-25 MED ORDER — CHLORHEXIDINE GLUCONATE 0.12 % MT SOLN
OROMUCOSAL | Status: AC
  Administered 2023-05-25: 15 mL via OROMUCOSAL
  Filled 2023-05-25: qty 15

## 2023-05-25 MED ORDER — ORAL CARE MOUTH RINSE: 15.0000 mL | Freq: Once | OROMUCOSAL | Status: AC

## 2023-05-25 MED ORDER — PHENYLEPHRINE 80 MCG/ML (10ML) SYRINGE FOR IV PUSH (FOR BLOOD PRESSURE SUPPORT)
PREFILLED_SYRINGE | INTRAVENOUS | Status: AC
  Filled 2023-05-25: qty 10

## 2023-05-25 MED ORDER — CHLORHEXIDINE GLUCONATE 0.12 % MT SOLN: 15.0000 mL | Freq: Once | OROMUCOSAL | Status: AC

## 2023-05-25 SURGICAL SUPPLY — 36 items
BLADE OSC/SAGITTAL MD 5.5X18 (BLADE) ×2 IMPLANT
BLADE SURG 10 STRL SS (BLADE) ×2 IMPLANT
BLADE SURG 15 STRL LF DISP TIS (BLADE) ×4 IMPLANT
BNDG COHESIVE 3X5 TAN ST LF (GAUZE/BANDAGES/DRESSINGS) ×2 IMPLANT
BNDG ELASTIC 3INX 5YD STR LF (GAUZE/BANDAGES/DRESSINGS) ×2 IMPLANT
BNDG ELASTIC 4INX 5YD STR LF (GAUZE/BANDAGES/DRESSINGS) IMPLANT
BNDG ELASTIC 6X10 VLCR STRL LF (GAUZE/BANDAGES/DRESSINGS) IMPLANT
BNDG ESMARK 4X9 LF (GAUZE/BANDAGES/DRESSINGS) ×2 IMPLANT
BNDG GAUZE DERMACEA FLUFF 4 (GAUZE/BANDAGES/DRESSINGS) IMPLANT
CHLORAPREP W/TINT 26 (MISCELLANEOUS) ×2 IMPLANT
DRSG XEROFORM 1X8 (GAUZE/BANDAGES/DRESSINGS) IMPLANT
ELECT REM PT RETURN 9FT ADLT (ELECTROSURGICAL) ×1 IMPLANT
ELECTRODE REM PT RTRN 9FT ADLT (ELECTROSURGICAL) ×2 IMPLANT
GAUZE SPONGE 2X2 STRL 8-PLY (GAUZE/BANDAGES/DRESSINGS) ×4 IMPLANT
GAUZE SPONGE 4X4 12PLY STRL (GAUZE/BANDAGES/DRESSINGS) ×2 IMPLANT
GAUZE STRETCH 2X75IN STRL (MISCELLANEOUS) ×2 IMPLANT
GAUZE XEROFORM 1X8 LF (GAUZE/BANDAGES/DRESSINGS) ×2 IMPLANT
GLOVE BIO SURGEON STRL SZ7.5 (GLOVE) ×2 IMPLANT
GLOVE BIOGEL PI IND STRL 7.5 (GLOVE) ×2 IMPLANT
GOWN STRL REUS W/ TWL LRG LVL3 (GOWN DISPOSABLE) ×4 IMPLANT
KIT BASIN OR (CUSTOM PROCEDURE TRAY) ×2 IMPLANT
NDL BIOPSY JAMSHIDI 8X6 (NEEDLE) IMPLANT
NDL HYPO 25X1 1.5 SAFETY (NEEDLE) ×4 IMPLANT
NDLE BIOPSY JAMSHIDI OT 8X11 (NEEDLE) IMPLANT
NEEDLE BIOPSY JAMSHIDI 8X6 (NEEDLE) IMPLANT
NEEDLE HYPO 25X1 1.5 SAFETY (NEEDLE) ×2 IMPLANT
PACK ORTHO EXTREMITY (CUSTOM PROCEDURE TRAY) ×2 IMPLANT
PADDING CAST ABS COTTON 4X4 ST (CAST SUPPLIES) ×4 IMPLANT
SPIKE FLUID TRANSFER (MISCELLANEOUS) ×4 IMPLANT
STAPLER VISISTAT 35W (STAPLE) IMPLANT
STOCKINETTE 4X48 STRL (DRAPES) ×2 IMPLANT
SUT ETHILON 3 0 FSLX (SUTURE) ×2 IMPLANT
SUT PROLENE 4 0 PS 2 18 (SUTURE) ×4 IMPLANT
SYR CONTROL 10ML LL (SYRINGE) ×4 IMPLANT
UNDERPAD 30X36 HEAVY ABSORB (UNDERPADS AND DIAPERS) ×2 IMPLANT
WATER STERILE IRR 1000ML POUR (IV SOLUTION) ×2 IMPLANT

## 2023-05-25 NOTE — Progress Notes (Signed)
PROGRESS NOTE  Lauren [name]  ZOX:[id number] DOB: 07/23/1980 DOA: 05/17/2023 PCP: Billie Lade, MD   Brief Narrative: Patient is a 43 y.o. 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   She had recent admits 04/21/23- 04/24/23 for DKA and UTI and 03/2023 with seizure, DKA, and AKI.  On presentation, she was  afebrile with soft BP, lab showed  WBC normal at 8.2k. AKI with Cr of  2.7 from baseline ~1.9. She had redness to the right foot, XR without significant osseous finding.MRI of the right foot 1/15 suggested acute osteomyelitis for which IV antibiotics were started, blood cultures ordered. Case discussed with the patient's podiatrist who suggested formal evaluation as inpatient for possible debridement, so transferred to [facility] from [facility].  Vascular surgery also consulted as per podiatry  recommendation.  PT recommending SNF on discharge.Podiatry planning for OR today for bone biopsy, excisional debridement of right foot ulcer  Assessment & Plan:  Principal Problem:   Acute kidney injury superimposed on stage 4 chronic kidney disease (HCC) Active Problems:   Essential hypertension   Obesity, Class II, BMI 35-39.9   Acquired hypothyroidism   Hypokalemia   Atypical chest pain   Elevated troponin   GERD (gastroesophageal reflux disease)   Dehydration   Type 2 diabetes mellitus with hypoglycemia (HCC)   Thrombocytosis   Closed fracture of left proximal humerus   Cellulitis of right lower extremity   Osteomyelitis of great toe of right foot (HCC)  Right foot cellulitis/myositis/acute osteomyelitis:  Currently on linezolid, ceftriaxone.  Blood cultures have been negative so far.MRI right foot showed cortical erosion and marrow edema consistent with acute osteomyelitis of the left great toe.  Podiatry planning for right great toe bone biopsy, superficial wound debridement .  If osteomyelitis is confirmed, may need further surgical procedures including amputation of the right great toe.  Vascular surgery  surgery planning for RLE angiogram  with CO2 on Thursday  Type 1 diabetes: On insulin pump at home.  Monitor blood sugars.  Continue current insulin regimen  AKI on CKD stage IV: Baseline creatinine around 1.9.  Presented with AKI.  Currently kidney function at baseline.  On sodium bicarb  Acute metabolic encephalopathy: Has short term  memory problem.  Continue delirium precaution, frequent reorientation.  Intermittently confused.  Currently alert oriented.  Normocytic anemia: Given unit of blood transfusion here.  Hemoglobin in the range of 7.  Also given a dose of IV iron. Likely from CKD.  Continue oral iron supplementation  History of CVA/carotid stenosis: Right hemisphere encephalomalacia noted on CT head with stent in right ICA.  Coumadin was placed on hold for upcoming procedure.  Given vitamin EE to lower back INR to facilitate lower procedure.  Has chronic left hemiparesis, weakness more on the left upper extremity with chronic edema  Chronic combined HFrEF, HTN: Echo within the past month showed LVEF 30-35%. Continue metoprolol, hydralazine, isosorbide. Not on ACE/ARB/ARNI due to advanced CKD.  Coronary artery disease: On Plavix, statin, beta-blocker at home.  Continuing to follow-up with cardiology as an outpatient.  No anginal symptoms  Left humeral  fracture: Remains on sling.  Continue pain management.  Dr.  Romeo Apple, orthopedics recommended outpatient follow-up in 2 weeks.  Has chronic edema of left upper extremity  Hypothyroidism: Continue Synthyroid  GERD: Continue  PPI  Seizure disorder: on Topamax  Recurrent falls/debility: PT/OT recommending SNF on discharge. Has left hemiparesis  from CVA          DVT prophylaxis:SCDs Start: 05/18/23 0158     Code Status: Limited: Do not attempt resuscitation (DNR) -DNR-LIMITED -Do Not Intubate/DNI   Family Communication: Family  at the bedside  Patient status:Inpatient  Patient is from :home  Anticipated discharge to:SNF  Estimated DC date: After full workup from  vascular surgery, podiatry   Consultants: Vascular surgery, podiatry  Procedures: None yet  Antimicrobials:  Anti-infectives (From admission, onward)    Start     Dose/Rate Route Frequency Ordered Stop   05/20/23 1400  vancomycin (VANCOREADY) IVPB 1250 mg/250 mL  Status:  Discontinued        1,250 mg 166.7 mL/hr over 90 Minutes Intravenous Every 48 hours 05/18/23 1337 05/19/23 1230   05/19/23 2200  [MAR Hold]  linezolid (ZYVOX) IVPB 600 mg        (MAR Hold since Wed 05/25/2023 at 0816.Hold Reason: Transfer to a Procedural area)   600 mg 300 mL/hr over 60 Minutes Intravenous Every 12 hours 05/19/23 1230     05/19/23 1400  [MAR Hold]  cefTRIAXone (ROCEPHIN) 2 g in sodium chloride 0.9 % 100 mL IVPB        (MAR Hold since Wed 05/25/2023 at 0816.Hold Reason: Transfer to a Procedural area)   2 g 200 mL/hr over 30 Minutes Intravenous Every 24 hours 05/19/23 1230     05/18/23 1430  vancomycin (VANCOREADY) IVPB 2000 mg/400 mL        2,000 mg 200 mL/hr over 120 Minutes Intravenous  Once 05/18/23 1331 05/18/23 2303   05/18/23 1430  ceFEPIme (MAXIPIME) 2 g in sodium chloride 0.9 % 100 mL IVPB  Status:  Discontinued        2 g 200 mL/hr over 30 Minutes Intravenous Every 12 hours 05/18/23 1338 05/19/23 1230       Subjective: Patient seen and examined at the bedside at peri operative area.  She was lying in bed.  She was waiting to go to OR.  No new complaints from yesterday.  Objective: Vitals:   05/25/23 1226 05/25/23 1227 05/25/23  1230 05/25/23 1251  BP: 113/80 116/68 116/68 116/68  Pulse:  (!) 106 (!) 104 (!) 105  Resp: 10 11 13 11   Temp: 98.2 F (36.8 C) 98.2 F (36.8 C)  98.2 F (36.8 C)  TempSrc: Oral Oral  Oral  SpO2:   95%   Weight:      Height:        Intake/Output Summary (Last 24 hours) at 05/25/2023 1259 Last data filed at 05/25/2023 1025 Gross per 24 hour  Intake 510 ml  Output 277 ml  Net 233 ml   Filed Weights   05/18/23 0639 05/21/23 2016 05/25/23 0822  Weight: 94 kg 95.6 kg 95.6 kg    Examination:  General exam: Overall comfortable, not in distress HEENT: PERRL Respiratory system:  no wheezes or crackles  Cardiovascular system: S1 & S2 heard, RRR.  Gastrointestinal system: Abdomen is nondistended, soft and nontender. Central nervous system: Alert and oriented Extremities: left upper extremity edema, no clubbing ,no cyanosis,sling on LUE Skin: Erythema, edema of right foot,   Data Reviewed: I have personally reviewed following labs and imaging studies  CBC: Recent Labs  Lab 05/22/23 0603 05/22/23 1042 05/23/23 0625 05/23/23 1103 05/24/23 0623 05/25/23 0611  WBC 7.5 6.5 7.7  --  8.2 8.5  HGB 7.4* 9.0* 7.4* 7.8* 7.8* 7.3*  HCT 24.2* 29.9* 24.1* 26.1* 26.2* 24.2*  MCV 89.3 90.1 88.9  --  90.7 90.3  PLT 369 425* 330  --  360 320   Basic Metabolic Panel: Recent Labs  Lab 05/21/23 0449 05/22/23 0603 05/23/23 0625 05/24/23 0623 05/25/23 0611  NA 139 136 139 138 136  K 3.4* 4.4 4.0 3.7 3.7  CL 108 106 107 111 109  CO2 22 18* 20* 18* 18*  GLUCOSE 204* 409* 268* 191* 87  BUN 25* 22* 23* 19 16  CREATININE 1.92* 2.26* 2.44* 2.18* 1.89*  CALCIUM 7.9* 7.8* 8.1* 8.1* 8.2*     Recent Results (from the past 240 hours)  Culture, blood (Routine X 2) w Reflex to ID Panel     Status: None   Collection Time: 05/19/23 12:17 PM   Specimen: BLOOD  Result Value Ref Range Status   Specimen Description BLOOD BLOOD RIGHT ARM  Final   Special Requests   Final    BOTTLES DRAWN  AEROBIC ONLY Blood Culture results may not be optimal due to an inadequate volume of blood received in culture bottles   Culture   Final    NO GROWTH 5 DAYS Performed at [facility], [address] [place], [place] [address]    Report Status 05/24/2023 FINAL  Final  Culture, blood (Routine X 2) w Reflex to ID Panel     Status: None   Collection Time: 05/19/23 12:21 PM   Specimen: BLOOD  Result Value Ref Range Status   Specimen Description BLOOD BLOOD RIGHT ARM  Final   Special Requests   Final    BOTTLES DRAWN AEROBIC AND ANAEROBIC Blood Culture adequate volume   Culture   Final    NO GROWTH 5 DAYS Performed at [facility], [address], [place], [place] [address]    Report Status 05/24/2023 FINAL  Final  Surgical PCR screen     Status: None   Collection Time: 05/24/23  9:22 PM   Specimen: Nasal Mucosa; Nasal Swab  Result Value Ref Range Status   MRSA, PCR NEGATIVE NEGATIVE Final   Staphylococcus aureus NEGATIVE NEGATIVE Final    Comment: (NOTE) The Xpert SA Assay (FDA approved for NASAL specimens in patients 67 years of age and older), is one component of a comprehensive surveillance program. It is not intended to diagnose infection nor to guide or monitor treatment. Performed at [facility], [address] [place], [place] [address]      Radiology Studies: VAS [name] UPPER EXTREMITY VENOUS DUPLEX Result Date: 05/24/2023 UPPER VENOUS STUDY  Patient Name:  Lauren [name]  Date of Exam:   05/23/2023 Medical Rec #: [id number]        Accession #:    [id number] Date of Birth: [birth year]        Patient Gender: F Patient Age:   55 years Exam Location:  [facility] Procedure:      VAS [name] UPPER EXTREMITY VENOUS DUPLEX Referring Phys: Hazeline Junker --------------------------------------------------------------------------------  Indications: Pain, and Left humerus fracture, hemiparesis Limitations: Non compliance, confusion, pain. Comparison Study: Prior left upper extremity  venous done 04/24/23 at [facility] Performing Technologist: Sherren Kerns RVS  Examination Guidelines: A complete evaluation includes B-mode imaging, spectral Doppler, color Doppler, and power Doppler as needed of all accessible portions of each vessel.  Bilateral testing is considered an integral part of a complete examination. Limited examinations for reoccurring indications may be performed as noted.  Left Findings: +----------+------------+---------+-----------+----------+---------------+ LEFT      CompressiblePhasicitySpontaneousProperties    Summary     +----------+------------+---------+-----------+----------+---------------+ IJV                                                 Not visualized  +----------+------------+---------+-----------+----------+---------------+ Subclavian                                          Not visualized  +----------+------------+---------+-----------+----------+---------------+ Axillary                 Yes       Yes                              +----------+------------+---------+-----------+----------+---------------+ Brachial                 Yes       Yes                              +----------+------------+---------+-----------+----------+---------------+ Radial                                              patent by color +----------+------------+---------+-----------+----------+---------------+ Ulnar                                               Not visualized  +----------+------------+---------+-----------+----------+---------------+ Cephalic      Full                                                  +----------+------------+---------+-----------+----------+---------------+ Basilic                                             Not visualized  +----------+------------+---------+-----------+----------+---------------+  Summary:  Left: No evidence of deep vein thrombosis or superficial vein thrombosis in the visualized  veins of the upper extremity. Study limited, see tech notes above.  *See table(s) above for measurements and observations.  Diagnosing physician: Carolynn Sayers Electronically signed by Carolynn Sayers on 05/24/2023 at 7:44:23 AM.    Final    VAS [name] ABI WITH/WO TBI Result Date: 05/24/2023  LOWER EXTREMITY DOPPLER STUDY Patient Name:  Lauren [name]  Date of Exam:   05/23/2023 Medical Rec #: [id number]        Accession #:    [id number] Date of Birth: 1981/03/31        Patient Gender: F Patient Age:   89 years Exam Location:  [facility] Procedure:      VAS [name] ABI WITH/WO TBI Referring Phys: Lelon Mast RHYNE --------------------------------------------------------------------------------  Indications: Peripheral artery disease. High Risk Factors: Hypertension, hyperlipidemia, Diabetes, past history of                    smoking, coronary artery disease. Other Factors: History of stroke with residual left side hemiparesis.  Vascular Interventions: Left anterior tibial, dorsalis pedis, and mid SFA                         angioplasty with stent to mid SFA 06/10/22. Right ICA                         stent. Right brachial, radial, and ulnar artery                         thrombectomy 11/18/21. 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 Limitations: Today's exam was limited due to involuntary patient movement,              edema, patient positioning and patient intolerant to cuff pressure. Comparison Study: Prior ABI done 11/30/22 Performing Technologist: Sherren Kerns RVS  Examination Guidelines: A complete evaluation includes at minimum, Doppler waveform signals and systolic blood pressure reading at the level of bilateral brachial, anterior tibial, and posterior tibial arteries, when vessel segments are accessible. Bilateral testing is considered an integral part of a complete examination. Photoelectric Plethysmograph (PPG) waveforms and toe systolic pressure readings are included as required and additional duplex testing as needed.  Limited examinations for reoccurring indications may be performed as noted.  ABI Findings: +---------+------------------+-----+--------+--------+ Right    Rt Pressure (mmHg)IndexWaveformComment  +---------+------------------+-----+--------+--------+ Brachial 153                    biphasic         +---------+------------------+-----+--------+--------+ PTA      254               1.66 biphasic         +---------+------------------+-----+--------+--------+ DP       254               1.66 biphasic         +---------+------------------+-----+--------+--------+ Great Toe78                0.51 Abnormal         +---------+------------------+-----+--------+--------+ +---------+------------------+-----+---------+----------+ Left     Lt Pressure (mmHg)IndexWaveform Comment    +---------+------------------+-----+---------+----------+ Brachial                                 restricted +---------+------------------+-----+---------+----------+ PTA      89                0.58 biphasic            +---------+------------------+-----+---------+----------+ DP       121               0.79 triphasic           +---------+------------------+-----+---------+----------+ Great Toe80                0.52 Abnormal            +---------+------------------+-----+---------+----------+ +-------+-------------+-----------+----------------+------------+ ABI/TBIToday's ABI  Today's TBIPrevious ABI    Previous TBI +-------+-------------+-----------+----------------+------------+ Right  1.66 non comp0.51       Non compressible0.56         +-------+-------------+-----------+----------------+------------+ Left   0.79  0.52       1.07 monophasic 0.55         +-------+-------------+-----------+----------------+------------+ Arterial wall calcification precludes accurate ankle pressures and ABIs. Right ABIs and bilateral TBIs appear essentially unchanged compared to prior  study on 11/30/22. Left ABIs appear decreased compared to prior study on 11/30/22.  Summary: Right: Resting right ankle-brachial index indicates noncompressible right lower extremity arteries. The right toe-brachial index is abnormal. Left: Resting left ankle-brachial index indicates moderate left lower extremity arterial disease. The left toe-brachial index is abnormal. *See table(s) above for measurements and observations.  Electronically signed by Carolynn Sayers on 05/24/2023 at 7:44:12 AM.    Final    VAS [name] LOWER EXTREMITY ARTERIAL DUPLEX Result Date: 05/24/2023 LOWER EXTREMITY ARTERIAL DUPLEX STUDY Patient Name:  Lauren [name]  Date of Exam:   05/23/2023 Medical Rec #: [id number]        Accession #:    [id number] Date of Birth: 10/06/1980        Patient Gender: F Patient Age:   52 years Exam Location:  [facility] Procedure:      VAS [name] LOWER EXTREMITY ARTERIAL DUPLEX Referring Phys: Lelon Mast RHYNE --------------------------------------------------------------------------------  Indications: Peripheral artery disease. High Risk Factors: Hypertension, hyperlipidemia, Diabetes, past history of                    smoking, prior CVA.  Vascular Interventions: Left ATA, DPA, and mid SFA angioplasty with stenting to                         the mid SFA 06/10/22. Current ABI:            R: non comp/0.56 L:0.79/0.55 Limitations: Patient constantly moving, complaining of pain with scan,              significantly edematous left lower extremity. Comparison Study: Prior left LEA done 11/30/22 Performing Technologist: Sherren Kerns RVS  Examination Guidelines: A complete evaluation includes B-mode imaging, spectral Doppler, color Doppler, and power Doppler as needed of all accessible portions of each vessel. Bilateral testing is considered an integral part of a complete examination. Limited examinations for reoccurring indications may be performed as noted.   +----------+--------+-----+--------+----------+--------+  LEFT      PSV cm/sRatioStenosisWaveform  Comments +----------+--------+-----+--------+----------+--------+ CFA Prox  51                   biphasic           +----------+--------+-----+--------+----------+--------+ DFA       35                   monophasic         +----------+--------+-----+--------+----------+--------+ SFA Prox  107                  biphasic           +----------+--------+-----+--------+----------+--------+ SFA Mid                                  stent    +----------+--------+-----+--------+----------+--------+ SFA Distal65                   monophasic         +----------+--------+-----+--------+----------+--------+ POP Prox  50                   monophasic         +----------+--------+-----+--------+----------+--------+  POP Mid   113                  monophasic         +----------+--------+-----+--------+----------+--------+ POP Distal68                   monophasic         +----------+--------+-----+--------+----------+--------+ ATA Distal28                   monophasic         +----------+--------+-----+--------+----------+--------+ PTA Prox  53                   monophasic         +----------+--------+-----+--------+----------+--------+ Significant edema noted throughout left lower extremity, limiting imaging.  Left Stent(s): +--------------+--++--------++ Prox to Stent 88biphasic +--------------+--++--------++ Proximal Stent86biphasic +--------------+--++--------++ Mid Stent     87         +--------------+--++--------++    Summary: Left: Mid SFA stent patent, biphasic waveforms noted. Collaterals noted in the proximal and distal calf. Waveforms become monophasic in the distal SFA, popliteal, and the visualized portions of the PT and AT arteries.  See table(s) above for measurements and observations. Electronically signed by Carolynn Sayers on 05/24/2023 at 7:43:44 AM.    Final     Scheduled Meds:   [MAR Hold] sodium chloride   Intravenous Once   [MAR Hold] atorvastatin  40 mg Oral Daily   [MAR Hold] clopidogrel  75 mg Oral Daily   [MAR Hold] hydrALAZINE  25 mg Oral BID   [MAR Hold] insulin aspart  0-9 Units Subcutaneous TID WC   [MAR Hold] insulin aspart  5 Units Subcutaneous TID WC   [MAR Hold] insulin glargine-yfgn  10 Units Subcutaneous BID   [MAR Hold] isosorbide mononitrate  60 mg Oral Daily   [MAR Hold] levothyroxine  125 mcg Oral Q0600   [MAR Hold] metoprolol succinate  50 mg Oral Daily   [MAR Hold] pantoprazole  40 mg Oral Daily   [MAR Hold] sodium bicarbonate  650 mg Oral BID   [MAR Hold] topiramate  50 mg Oral BID   Continuous Infusions:  acetaminophen     [MAR Hold] cefTRIAXone (ROCEPHIN)  IV 2 g (05/24/23 1455)   lactated ringers 10 mL/hr at 05/25/23 0912   [MAR Hold] linezolid (ZYVOX) IV 600 mg (05/24/23 2111)     LOS: 8 days   Burnadette Pop, MD [facility] P1/22/2025, 12:59 PM

## 2023-05-25 NOTE — Progress Notes (Addendum)
      Plan for CO2 angiogram 05/26/23 pre op orders placed Patient OR today for bone biopsy verse GT amputation with DPM  Coumadin on hold  Mosetta Pigeon PA-C   I agree with the above. Osteomyelitis s/p R great toe distal phalanx amp today. ABI Non compressible. Previous L SFA stenting in Feb 2024 Cr. Downtrending. Coumadin held and INR 1.3 today Plan for Cath lab tomorrow with Dr. Chestine Spore for Aortogram and RLE angiogram  Daria Pastures MD [facility] Phone Number: [phone] 05/25/2023 10:48 AM

## 2023-05-25 NOTE — Transfer of Care (Signed)
Immediate Anesthesia Transfer of Care Note  Patient: Lauren [name]  Procedure(s) Performed: BONE BIOPSY OF RIGHT GREAT TOE (Right)  Patient Location: PACU  Anesthesia Type:MAC  Level of Consciousness: awake  Airway & Oxygen Therapy: Patient Spontanous Breathing and Patient connected to face mask oxygen  Post-op Assessment: Report given to RN and Post -op Vital signs reviewed and stable  Post vital signs: Reviewed and stable  Last Vitals:  Vitals Value Taken Time  BP 115/73 05/25/23 1036  Temp 36.7 C 05/25/23 1037  Pulse 90 05/25/23 1042  Resp 14 05/25/23 1042  SpO2 100 % 05/25/23 1042  Vitals shown include unfiled device data.  Last Pain:  Vitals:   05/25/23 0855  TempSrc:   PainSc: 0-No pain      Patients Stated Pain Goal: 3 (05/25/23 0855)  Complications: No notable events documented.

## 2023-05-25 NOTE — Op Note (Addendum)
Full Operative Report  Date of Operation: 10:29 AM, 05/25/2023   Patient: Lauren [name] - 43 y.o. female  Surgeon: Pilar Plate, DPM   Assistant: None  Diagnosis: Osteomyelitis right great toe  Procedure:  1.  Bone biopsy distal phalanx right great toe 2. Excisional debridement of right foot ulceration to subcutaneous fat level, right foot    Anesthesia: Anesthesia type not filed in the log.  Val Eagle, MD  Anesthesiologist: Val Eagle, MD CRNA: Thomasene Ripple, CRNA   Estimated Blood Loss: Minimal   Hemostasis: 1) Anatomical dissection, mechanical compression, electrocautery 2) no tourniquet was used during procedure  Implants: * No implants in log *  Materials: 3-0 Prolene  Injectables: 1) Pre-operatively: 10 cc of 50:50 mixture 1%lidocaine plain and 0.5% marcaine plain 2) Post-operatively: None   Specimens: Pathology: Core of bone from distal phalanx for pathology; microbiology: Core of bone from distal phalanx for micro   Antibiotics: IV antibiotics given per schedule on the floor  Drains: None  Complications: Patient tolerated the procedure well without complication.   Operative findings: As below in detailed report  Indications for Procedure: Lauren [name] presents to [facility], Jenelle Mages, DPM with a chief complaint of cellulitis of the right lower extremity and concern for osteomyelitis on MRI of the distal phalanx.  The patient has failed conservative treatments of various modalities. At this time the patient has elected to proceed with surgical correction. All alternatives, risks, and complications of the procedures were thoroughly explained to the patient. Patient exhibits appropriate understanding of all discussion points and informed consent was signed and obtained in the chart with no guarantees to surgical outcome given or implied.  Description of Procedure: Patient was brought to the operating room. Patient remained  on their hospital bed in the supine position. A surgical timeout was performed and all members of the operating room, the procedure, and the surgical site were identified. anesthesia occurred as per anesthesia record. Local anesthetic as previously described was then injected about the operative field in a local infiltrative block.  The operative lower extremity as noted above was then prepped and draped in the usual sterile manner. The following procedure then began.  Attention was directed to the distal aspect of the right great toe.  A small stab incision was made at the distal tuft of the toe.  Hemostat was used to bluntly dissect down to the distal phalanx.  Then using an Arthrex Jamshidi needle 2 separate cores of bone were taken from the distal phalanx.  The first core was sent for pathology.  The second core was sent for microbiology.  This area was then irrigated thoroughly with sterile saline and then was closed with 3-0 Prolene.  There was some bleeding noted from the stab incision.  Attention was then directed to the very small less than half centimeter circular ulceration of the dorsal lateral base of the great toe.  This was excisionally debrided with 15 blade scalpel down to subcutaneous fat tissue very superficial in nature.  No bone or tendon involvement.  There was healthy bleeding from this ulcer upon debridement.  Upon debridement there was no remaining fibrotic or necrotic tissues present in the wound base.  This was then dressed with Xeroform dressing.  Hemostasis was achieved with pressure dressing  The surgical site was then dressed with Xeroform 4 x 4 Kerlix Ace wrap. The patient tolerated both the procedure and anesthesia well with vital signs stable throughout. The patient was transferred in good condition and  all vital signs stable  from the OR to recovery under the discretion of anesthesia.  Condition: Vital signs stable, neurovascular status unchanged from preoperative    Surgical plan:  No obvious evidence of necrosis or infection in the distal phalanx seen in the bone biopsy core.  Patient is adamant she does not want a right great toe amputation at this time I do not feel it is indicated.  I would recommend following up cultures and pathology results.  If positive for osteomyelitis I recommend IV antibiotic therapy for prolonged duration.  If this fails or patient has recurrent issues in the future then we would need to consider amputation but not indicated currently.  Leave the dressing to the right foot intact for 5 to 7 days.  Follow-up next week.  Weightbearing as tolerated to right foot in postop shoe.  Continue IV antibiotics.  ID consult recommended if bone biopsy positive for osteomyelitis.  The patient will be weightbearing as tolerated in a postop shoe to the operative limb until further instructed. The dressing is to remain clean, dry, and intact. Will continue to follow unless noted elsewhere.   Carlena Hurl, DPM [facility]

## 2023-05-25 NOTE — Anesthesia Preprocedure Evaluation (Addendum)
Anesthesia Evaluation  Patient identified by MRN, date of birth, ID band Patient awake    Reviewed: Allergy & Precautions, NPO status , Patient's Chart, lab work & pertinent test results  History of Anesthesia Complications (+) PONV and history of anesthetic complications  Airway Mallampati: III  TM Distance: >3 FB Neck ROM: Full    Dental  (+) Dental Advisory Given, Poor Dentition   Pulmonary neg sleep apnea, neg COPD, neg recent URI, former smoker   breath sounds clear to auscultation       Cardiovascular hypertension, Pt. on home beta blockers and Pt. on medications + CAD, + Past MI, + Peripheral Vascular Disease and +CHF   Rhythm:Regular  1. Left ventricular ejection fraction, by estimation, is 30 to 35%. The  left ventricle has moderately decreased function. The left ventricle  demonstrates global hypokinesis. The left ventricular internal cavity size  was moderately dilated. Left  ventricular diastolic parameters are indeterminate.   2. Right ventricular systolic function is normal. The right ventricular  size is normal.   3. Left atrial size was mildly dilated.   4. The mitral valve is normal in structure. Trivial mitral valve  regurgitation. No evidence of mitral stenosis.   5. The aortic valve is tricuspid. Aortic valve regurgitation is not  visualized. No aortic stenosis is present.   6. The inferior vena cava is normal in size with greater than 50%  respiratory variability, suggesting right atrial pressure of 3 mmHg.     Neuro/Psych Seizures -,  CVA, Residual Symptoms  negative psych ROS   GI/Hepatic Neg liver ROS,GERD  Medicated,,  Endo/Other  diabetes, Insulin DependentHypothyroidism    Renal/GU CRFRenal diseaseLab Results      Component                Value               Date                      NA                       136                 05/25/2023                K                        3.7                  05/25/2023                CO2                      18 (L)              05/25/2023                GLUCOSE                  87                  05/25/2023                BUN                      16  05/25/2023                CREATININE               1.89 (H)            05/25/2023                CALCIUM                  8.2 (L)             05/25/2023                EGFR                     25 (L)              08/11/2022                GFRNONAA                 34 (L)              05/25/2023                Musculoskeletal   Abdominal   Peds  Hematology  (+) Blood dyscrasia, anemia Lab Results      Component                Value               Date                      WBC                      8.5                 05/25/2023                HGB                      7.3 (L)             05/25/2023                HCT                      24.2 (L)            05/25/2023                MCV                      90.3                05/25/2023                PLT                      320                 05/25/2023              Anesthesia Other Findings   Reproductive/Obstetrics                             Anesthesia Physical Anesthesia Plan  ASA: 4  Anesthesia Plan: MAC   Post-op Pain Management: Minimal or no  pain anticipated   Induction: Intravenous  PONV Risk Score and Plan: 3 and Treatment may vary due to age or medical condition and Ondansetron  Airway Management Planned: Nasal Cannula, Natural Airway and Simple Face Mask  Additional Equipment: None  Intra-op Plan:   Post-operative Plan:   Informed Consent: I have reviewed the patients History and Physical, chart, labs and discussed the procedure including the risks, benefits and alternatives for the proposed anesthesia with the patient or authorized representative who has indicated his/her understanding and acceptance.     Dental advisory given  Plan Discussed with:  CRNA  Anesthesia Plan Comments:         Anesthesia Quick Evaluation

## 2023-05-25 NOTE — Progress Notes (Signed)
Surgeon stated to give one unit PRBCs during surgery. Anesthesia made aware.

## 2023-05-25 NOTE — Progress Notes (Signed)
Another consult was placed to the IV Team for new IV access; IV that was placed in PACU has clotted off and has been removed;  pt has been assessed by 3 other IV nurses, and has an order for a PICC line to be placed;  Right arm was assessed again , with ultrasound; multiple bruises noted, veins are small and do not compress; pt stated that "they've even tried to place a line in my groin before but haven't been able to;" No attempts made;  RN aware that pt has no iv access at this time.

## 2023-05-25 NOTE — Progress Notes (Deleted)
   LMP 05/05/2023 (Exact Date)   There is no height or weight on file to calculate BMI.  No chief complaint on file.   Encounter Diagnosis  Name Primary?   Other closed nondisplaced fracture of proximal end of left humerus with routine healing, subsequent encounter 04/25/23 Yes    DOI/DOS/ Date: 04/25/23  {CHL AMB ORT SYMPTOMS POST TREATMENT:21798}
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
## 2023-05-25 NOTE — Plan of Care (Signed)
  Problem: Pain Management: Goal: General experience of comfort will improve Outcome: Progressing   Problem: Safety: Goal: Ability to remain free from injury will improve Outcome: Progressing

## 2023-05-25 NOTE — Progress Notes (Signed)
Dear Doctor: 2nd VAST nurse to assess patient for PIV placement. This patient has been identified as a candidate for PICC /CVC for the following reason (s): IV therapy over 48 hours, poor veins/poor circulatory system (CHF, COPD, emphysema, diabetes, steroid use, IV drug abuse, etc.), and incompatible drugs (aminophyllin, TPN, heparin, given with an antibiotic) If you agree, please write an order for the indicated device.   Thank you for supporting the early vascular access assessment program.

## 2023-05-25 NOTE — Progress Notes (Signed)
Peripherally Inserted Central Catheter Placement  The IV Nurse has discussed with the patient and/or persons authorized to consent for the patient, the purpose of this procedure and the potential benefits and risks involved with this procedure.  The benefits include less needle sticks, lab draws from the catheter, and the patient may be discharged home with the catheter. Risks include, but not limited to, infection, bleeding, blood clot (thrombus formation), and puncture of an artery; nerve damage and irregular heartbeat and possibility to perform a PICC exchange if needed/ordered by physician.  Alternatives to this procedure were also discussed.  Bard Power PICC patient education guide, fact sheet on infection prevention and patient information card has been provided to patient /or left at bedside. PICC placed by Burnard Bunting RN.    PICC Placement Documentation  PICC Double Lumen 05/25/23 Right Basilic 35 cm 1 cm (Active)  Indication for Insertion or Continuance of Line Poor Vasculature-patient has had multiple peripheral attempts or PIVs lasting less than 24 hours 05/25/23 2030  Exposed Catheter (cm) 1 cm 05/25/23 2030  Site Assessment Clean, Dry, Intact 05/25/23 2030  Lumen #1 Status Blood return noted;Flushed;Saline locked 05/25/23 2030  Lumen #2 Status Blood return noted;Flushed;Saline locked 05/25/23 2030  Dressing Type Transparent;Securing device 05/25/23 2030  Dressing Status Antimicrobial disc/dressing in place;Clean, Dry, Intact 05/25/23 2030  Line Care Connections checked and tightened 05/25/23 2030  Line Adjustment (NICU/IV Team Only) No 05/25/23 2030  Dressing Intervention New dressing;Adhesive placed at insertion site (IV team only) 05/25/23 2030  Dressing Change Due 06/01/23 05/25/23 2030       Christeen Douglas 05/25/2023, 8:36 PM

## 2023-05-25 NOTE — Progress Notes (Signed)
History and Physical Interval Note:  05/25/2023 8:19 AM  Lauren [name]  has presented today for surgery, with the diagnosis of possible osteomyelitis of right great toe.  The various methods of treatment have been discussed with the patient and family. After consideration of risks, benefits and other options for treatment, the patient has consented to   Procedure(s) with comments: BONE BIOPSY (Right) - Great toe bone biopsy versus amputation as a surgical intervention.  The patient's history has been reviewed, patient examined, no change in status, stable for surgery.  I have reviewed the patient's chart and labs.  Questions were answered to the patient's satisfaction.     Jenelle Mages Aundray Cartlidge

## 2023-05-25 NOTE — Progress Notes (Signed)
Orthopedic Tech Progress Note Patient Details:  Lauren [name] 1980-10-30 [id number]  Ortho Devices Type of Ortho Device: Postop shoe/boot Ortho Device/Splint Location: delivered to pacu Ortho Device/Splint Interventions: Rich Brave 05/25/2023, 11:57 AM

## 2023-05-25 NOTE — Progress Notes (Signed)
PT Cancellation Note  Patient Details Name: Lauren [name] MRN: [id number] DOB: August 23, 1980   Cancelled Treatment:    Reason Eval/Treat Not Completed: Patient at procedure or test/unavailable  Lillia Pauls, PT, DPT Acute Rehabilitation Services Office [phone]    Norval Morton 05/25/2023, 8:24 AM

## 2023-05-26 ENCOUNTER — Inpatient Hospital Stay (HOSPITAL_COMMUNITY): Payer: 59

## 2023-05-26 ENCOUNTER — Encounter: Payer: 59 | Admitting: Orthopedic Surgery

## 2023-05-26 ENCOUNTER — Ambulatory Visit (HOSPITAL_COMMUNITY): Admission: RE | Admit: 2023-05-26 | Payer: 59 | Source: Ambulatory Visit | Admitting: Vascular Surgery

## 2023-05-26 ENCOUNTER — Encounter (HOSPITAL_COMMUNITY)
Admission: EM | Discharge status: Against Medical Advice | Payer: Self-pay | Source: Home / Self Care | Attending: Internal Medicine

## 2023-05-26 ENCOUNTER — Encounter (HOSPITAL_COMMUNITY): Payer: Self-pay | Admitting: Podiatry

## 2023-05-26 DIAGNOSIS — N179 Acute kidney failure, unspecified: Secondary | ICD-10-CM | POA: Diagnosis not present

## 2023-05-26 DIAGNOSIS — I7090 Unspecified atherosclerosis: Secondary | ICD-10-CM

## 2023-05-26 DIAGNOSIS — I70234 Atherosclerosis of native arteries of right leg with ulceration of heel and midfoot: Secondary | ICD-10-CM

## 2023-05-26 DIAGNOSIS — N184 Chronic kidney disease, stage 4 (severe): Secondary | ICD-10-CM | POA: Diagnosis not present

## 2023-05-26 DIAGNOSIS — S42295D Other nondisplaced fracture of upper end of left humerus, subsequent encounter for fracture with routine healing: Secondary | ICD-10-CM

## 2023-05-26 HISTORY — PX: ABDOMINAL AORTOGRAM W/LOWER EXTREMITY: CATH118223

## 2023-05-26 LAB — CBC
HCT: 25.3 % — ABNORMAL LOW (ref 36.0–46.0)
Hemoglobin: 7.8 g/dL — ABNORMAL LOW (ref 12.0–15.0)
MCH: 28.1 pg (ref 26.0–34.0)
MCHC: 30.8 g/dL (ref 30.0–36.0)
MCV: 91 fL (ref 80.0–100.0)
Platelets: 264 10*3/uL (ref 150–400)
RBC: 2.78 MIL/uL — ABNORMAL LOW (ref 3.87–5.11)
RDW: 16.7 % — ABNORMAL HIGH (ref 11.5–15.5)
WBC: 11.8 10*3/uL — ABNORMAL HIGH (ref 4.0–10.5)
nRBC: 0 % (ref 0.0–0.2)

## 2023-05-26 LAB — TYPE AND SCREEN
ABO/RH(D): A POS
Antibody Screen: POSITIVE
Donor AG Type: NEGATIVE
Donor AG Type: NEGATIVE
Unit division: 0
Unit division: 0

## 2023-05-26 LAB — BASIC METABOLIC PANEL
Anion gap: 10 (ref 5–15)
BUN: 22 mg/dL — ABNORMAL HIGH (ref 6–20)
CO2: 20 mmol/L — ABNORMAL LOW (ref 22–32)
Calcium: 7.7 mg/dL — ABNORMAL LOW (ref 8.9–10.3)
Chloride: 100 mmol/L (ref 98–111)
Creatinine, Ser: 2.12 mg/dL — ABNORMAL HIGH (ref 0.44–1.00)
GFR, Estimated: 30 mL/min — ABNORMAL LOW (ref >60)
Glucose, Bld: 316 mg/dL — ABNORMAL HIGH (ref 70–99)
Potassium: 3.8 mmol/L (ref 3.5–5.1)
Sodium: 130 mmol/L — ABNORMAL LOW (ref 135–145)

## 2023-05-26 LAB — BPAM RBC
Blood Product Expiration Date: 202501302359
Blood Product Expiration Date: 202502072359
ISSUE DATE / TIME: 202501221215
Unit Type and Rh: 202501302359
Unit Type and Rh: 6200
Unit Type and Rh: 6200

## 2023-05-26 LAB — GLUCOSE, CAPILLARY
Glucose-Capillary: 229 mg/dL — ABNORMAL HIGH (ref 70–99)
Glucose-Capillary: 187 mg/dL — ABNORMAL HIGH (ref 70–99)
Glucose-Capillary: 221 mg/dL — ABNORMAL HIGH (ref 70–99)
Glucose-Capillary: 354 mg/dL — ABNORMAL HIGH (ref 70–99)
Glucose-Capillary: 270 mg/dL — ABNORMAL HIGH (ref 70–99)

## 2023-05-26 LAB — SURGICAL PATHOLOGY

## 2023-05-26 LAB — PROTIME-INR
INR: 1.2 (ref 0.8–1.2)
Prothrombin Time: 15.3 s — ABNORMAL HIGH (ref 11.4–15.2)

## 2023-05-26 SURGERY — ABDOMINAL AORTOGRAM W/LOWER EXTREMITY
Anesthesia: LOCAL | Laterality: Right

## 2023-05-26 MED ORDER — MIDAZOLAM HCL 2 MG/2ML IJ SOLN
INTRAMUSCULAR | Status: AC
  Filled 2023-05-26: qty 2

## 2023-05-26 MED ORDER — HYDRALAZINE HCL 20 MG/ML IJ SOLN: 5.0000 mg | INTRAMUSCULAR | Status: DC | PRN

## 2023-05-26 MED ORDER — LIDOCAINE HCL (PF) 1 % IJ SOLN
INTRAMUSCULAR | Status: DC | PRN
  Administered 2023-05-26: 15 mL

## 2023-05-26 MED ORDER — METHYLPREDNISOLONE SODIUM SUCC 125 MG IJ SOLR
125.0000 mg | Freq: Once | INTRAMUSCULAR | Status: AC
  Administered 2023-05-26: 125 mg via INTRAVENOUS

## 2023-05-26 MED ORDER — SODIUM CHLORIDE 0.9% FLUSH: 3.0000 mL | INTRAVENOUS | Status: DC | PRN

## 2023-05-26 MED ORDER — SODIUM CHLORIDE 0.9 % IV SOLN: INTRAVENOUS | Status: DC

## 2023-05-26 MED ORDER — FENTANYL CITRATE (PF) 100 MCG/2ML IJ SOLN
INTRAMUSCULAR | Status: DC | PRN
  Administered 2023-05-26: 25 ug via INTRAVENOUS

## 2023-05-26 MED ORDER — IODIXANOL 320 MG/ML IV SOLN
INTRAVENOUS | Status: DC | PRN
  Administered 2023-05-26: 15 mL

## 2023-05-26 MED ORDER — DIPHENHYDRAMINE HCL 50 MG/ML IJ SOLN
INTRAMUSCULAR | Status: AC
  Filled 2023-05-26: qty 1

## 2023-05-26 MED ORDER — METHYLPREDNISOLONE SODIUM SUCC 125 MG IJ SOLR
INTRAMUSCULAR | Status: AC
  Filled 2023-05-26: qty 2

## 2023-05-26 MED ORDER — MIDAZOLAM HCL 2 MG/2ML IJ SOLN
INTRAMUSCULAR | Status: DC | PRN
  Administered 2023-05-26: 1 mg via INTRAVENOUS

## 2023-05-26 MED ORDER — DIPHENHYDRAMINE HCL 50 MG/ML IJ SOLN
25.0000 mg | Freq: Once | INTRAMUSCULAR | Status: AC
  Administered 2023-05-26: 25 mg via INTRAVENOUS

## 2023-05-26 MED ORDER — SODIUM CHLORIDE 0.9 % IV SOLN: 250.0000 mL | INTRAVENOUS | Status: AC | PRN

## 2023-05-26 MED ORDER — LABETALOL HCL 5 MG/ML IV SOLN: 10.0000 mg | INTRAVENOUS | Status: DC | PRN

## 2023-05-26 MED ORDER — HEPARIN (PORCINE) IN NACL 1000-0.9 UT/500ML-% IV SOLN
INTRAVENOUS | Status: DC | PRN
  Administered 2023-05-26 (×2): 500 mL

## 2023-05-26 MED ORDER — FENTANYL CITRATE (PF) 100 MCG/2ML IJ SOLN
INTRAMUSCULAR | Status: AC
  Filled 2023-05-26: qty 2

## 2023-05-26 MED ORDER — SODIUM CHLORIDE 0.9% FLUSH
3.0000 mL | Freq: Two times a day (BID) | INTRAVENOUS | Status: DC
  Administered 2023-05-27 – 2023-05-28 (×2): 3 mL via INTRAVENOUS

## 2023-05-26 MED ORDER — LIDOCAINE HCL (PF) 1 % IJ SOLN
INTRAMUSCULAR | Status: AC
  Filled 2023-05-26: qty 30

## 2023-05-26 MED ORDER — ACETAMINOPHEN 325 MG PO TABS: 650.0000 mg | ORAL_TABLET | ORAL | Status: DC | PRN

## 2023-05-26 SURGICAL SUPPLY — 12 items
CATH OMNI FLUSH 5F 65CM (CATHETERS) IMPLANT
CATH TEMPO AQUA 5F 100CM (CATHETERS) IMPLANT
COVER DOME SNAP 22 D (MISCELLANEOUS) IMPLANT
KIT ANGIASSIST CO2 SYSTEM (KITS) IMPLANT
KIT MICROPUNCTURE NIT STIFF (SHEATH) IMPLANT
KIT SINGLE USE MANIFOLD (KITS) IMPLANT
SET ATX-X65L (MISCELLANEOUS) IMPLANT
SHEATH PINNACLE 5F 10CM (SHEATH) IMPLANT
SHEATH PROBE COVER 6X72 (BAG) IMPLANT
TRAY PV CATH (CUSTOM PROCEDURE TRAY) ×2 IMPLANT
WIRE BENTSON .035X145CM (WIRE) IMPLANT
WIRE TORQFLEX AUST .018X40CM (WIRE) IMPLANT

## 2023-05-26 NOTE — Progress Notes (Addendum)
PROGRESS NOTE  Lauren [name]  ZOX:[id number] DOB: August 01, 1980 DOA: 05/17/2023 PCP: Billie Lade, MD   Brief Narrative: Patient is a 43 y.o. 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   She had recent admits 04/21/23- 04/24/23 for DKA and UTI and 03/2023 with seizure, DKA, and AKI.  On presentation, she was  afebrile with soft BP, lab showed  WBC normal at 8.2k. AKI with Cr of  2.7 from baseline ~1.9. She had redness to the right foot, XR without significant osseous finding.MRI of the right foot 1/15 suggested acute osteomyelitis for which IV antibiotics were started, blood cultures ordered. Case discussed with the patient's podiatrist who suggested formal evaluation as inpatient for possible debridement, so transferred to [facility] from [facility].  Vascular surgery also consulted as per podiatry  recommendation.  PT recommending SNF on discharge.s/p bone biopsy, excisional debridement of right foot ulcer on 1/22  Assessment & Plan:  Principal Problem:   Acute kidney injury superimposed on stage 4 chronic kidney disease (HCC) Active Problems:   Essential hypertension   Obesity, Class II, BMI 35-39.9   Acquired hypothyroidism   Hypokalemia   Atypical chest pain   Elevated troponin   GERD (gastroesophageal reflux disease)   Dehydration   Type 2 diabetes mellitus with hypoglycemia (HCC)   Thrombocytosis   Closed fracture of left proximal humerus   Cellulitis of right lower extremity   Osteomyelitis of great toe of right foot (HCC)  Right foot cellulitis/myositis/suspected acute osteomyelitis: Started  on  linezolid, ceftriaxone.  Blood cultures have been negative so far.MRI right foot showed cortical erosion and marrow edema consistent with acute osteomyelitis of the left great toe.  S/P right great toe bone biopsy, superficial wound debridement .  Vascular surgery  surgery planning for RLE angiogram  with CO2 today As per podiatry note, pathology result is negative for osteomyelitis.  No plan for further surgical intervention.  Will change antibiotics to oral after discussion with podiatry.  Type 1 diabetes: On insulin pump at home.  Monitor blood sugars.  Continue current insulin regimen  AKI on CKD stage IV: Baseline creatinine around 1.9.  Presented with AKI.  On sodium bicarb.  Sodium of 130, creatinine bumped up to 2.1 today, starting on gentle iv fluid  Acute metabolic encephalopathy: Has short term  memory problem.  Continue delirium precaution, frequent reorientation.  Intermittently confused.  Currently alert oriented.  Normocytic anemia: Given unit of blood transfusion here.  Hemoglobin in the range of 7.  Also given a dose of IV iron. Likely from CKD.  Continue oral iron supplementation  History of CVA/carotid stenosis: Right hemisphere encephalomalacia noted on CT head with stent in right ICA.  Coumadin was placed on hold for upcoming procedure.  Given vitamin K to lower  INR to facilitate planned procedure.  Has chronic left hemiparesis, weakness more on the left upper extremity with chronic edema  Chronic combined HFrEF, HTN: Echo within the past month showed LVEF 30-35%. Continue metoprolol, hydralazine, isosorbide. Not on ACE/ARB/ARNI due to advanced CKD.  Coronary artery disease: On Plavix, statin, beta-blocker at home.  Continuing to follow-up with cardiology as an outpatient.  No anginal  symptoms  Left humeral  fracture: Remains on sling.  Continue pain management.  Dr. Romeo Apple, orthopedics recommended outpatient follow-up in 2 weeks.  Has chronic edema of left upper  extremity  Hypothyroidism: Continue Synthyroid  GERD: Continue PPI  Seizure disorder: on Topamax  Recurrent falls/debility: PT/OT recommending SNF on discharge. Has left hemiparesis  from CVA.  TOC following          DVT prophylaxis:SCDs Start: 05/18/23 0158     Code Status: Limited: Do not attempt resuscitation (DNR) -DNR-LIMITED -Do Not Intubate/DNI   Family Communication: Family  at the bedside on 1/22  Patient status:Inpatient  Patient is from :home  Anticipated discharge to:SNF  Estimated DC date: After full workup from  vascular surgery, bed availability   Consultants: Vascular surgery, podiatry  Procedures: Bone biopsy/debridement of right foot  Antimicrobials:  Anti-infectives (From admission, onward)    Start     Dose/Rate Route Frequency Ordered Stop   05/20/23 1400  vancomycin (VANCOREADY) IVPB 1250 mg/250 mL  Status:  Discontinued        1,250 mg 166.7 mL/hr over 90 Minutes Intravenous Every 48 hours 05/18/23 1337 05/19/23 1230   05/19/23 2200  [MAR Hold]  linezolid (ZYVOX) IVPB 600 mg        (MAR Hold since Thu 05/26/2023 at 1043.Hold Reason: Transfer to a Procedural area)   600 mg 300 mL/hr over 60 Minutes Intravenous Every 12 hours 05/19/23 1230     05/19/23 1400  [MAR Hold]  cefTRIAXone (ROCEPHIN) 2 g in sodium chloride 0.9 % 100 mL IVPB        (MAR Hold since Thu 05/26/2023 at 1043.Hold Reason: Transfer to a Procedural area)   2 g 200 mL/hr over 30 Minutes Intravenous Every 24 hours 05/19/23 1230     05/18/23 1430  vancomycin (VANCOREADY) IVPB 2000 mg/400 mL        2,000 mg 200 mL/hr over 120 Minutes Intravenous  Once 05/18/23 1331 05/18/23 2303   05/18/23 1430  ceFEPIme (MAXIPIME) 2 g in sodium chloride 0.9 % 100 mL IVPB  Status:  Discontinued        2 g 200 mL/hr over 30 Minutes Intravenous Every 12 hours 05/18/23 1338 05/19/23 1230       Subjective: Patient seen and examined at bedside today.  She was comfortably sitting in the chair.   Denies any pain on the right foot.  Right foot covered with dressing.  She was waiting for vascular procedure  Objective: Vitals:   05/26/23 0307 05/26/23 0830 05/26/23 0904 05/26/23 1112  BP: 130/87 (!) 88/41 (!) 138/91   Pulse: 99 91    Resp: 18 18    Temp: 98.2 F (36.8 C) 98.1 F (36.7 C)    TempSrc: Oral     SpO2: 98% 100%  100%  Weight:      Height:        Intake/Output Summary (Last 24 hours) at 05/26/2023 1156 Last data filed at 05/26/2023 0300 Gross per 24 hour  Intake 628.5 ml  Output --  Net 628.5 ml   Filed Weights   05/18/23 0639 05/21/23 2016 05/25/23 0822  Weight: 94 kg 95.6 kg 95.6 kg    Examination:  General exam: Overall comfortable, not in distress HEENT: PERRL Respiratory system:  no wheezes or crackles  Cardiovascular system: S1 & S2 heard, RRR.  Gastrointestinal system: Abdomen is nondistended, soft and nontender. Central nervous system: Alert and oriented Extremities: left upper extremity edema, no clubbing ,no cyanosis,sling on LUE,  dressing on right foot Skin: No ulcers,   Data Reviewed: I have personally reviewed following labs and imaging studies  CBC: Recent Labs  Lab 05/22/23 1042 05/23/23 0625 05/23/23 1103 05/24/23 0623 05/25/23 0611 05/26/23 0429  WBC 6.5 7.7  --  8.2 8.5 11.8*  HGB 9.0* 7.4* 7.8* 7.8* 7.3* 7.8*  HCT 29.9* 24.1* 26.1* 26.2* 24.2* 25.3*  MCV 90.1 88.9  --  90.7 90.3 91.0  PLT 425* 330  --  360 320 264   Basic Metabolic Panel: Recent Labs  Lab 05/22/23 0603 05/23/23 0625 05/24/23 0623 05/25/23 0611 05/26/23 0429  NA 136 139 138 136 130*  K 4.4 4.0 3.7 3.7 3.8  CL 106 107 111 109 100  CO2 18* 20* 18* 18* 20*  GLUCOSE 409* 268* 191* 87 316*  BUN 22* 23* 19 16 22*  CREATININE 2.26* 2.44* 2.18* 1.89* 2.12*  CALCIUM 7.8* 8.1* 8.1* 8.2* 7.7*     Recent Results (from the past 240 hours)  Culture, blood (Routine X 2) w Reflex to ID Panel     Status: None   Collection Time: 05/19/23 12:17 PM    Specimen: BLOOD  Result Value Ref Range Status   Specimen Description BLOOD BLOOD RIGHT ARM  Final   Special Requests   Final    BOTTLES DRAWN AEROBIC ONLY Blood Culture results may not be optimal due to an inadequate volume of blood received in culture bottles   Culture   Final    NO GROWTH 5 DAYS Performed at [facility], [address], [place], [place] [address]    Report Status 05/24/2023 FINAL  Final  Culture, blood (Routine X 2) w Reflex to ID Panel     Status: None   Collection Time: 05/19/23 12:21 PM   Specimen: BLOOD  Result Value Ref Range Status   Specimen Description BLOOD BLOOD RIGHT ARM  Final   Special Requests   Final    BOTTLES DRAWN AEROBIC AND ANAEROBIC Blood Culture adequate volume   Culture   Final    NO GROWTH 5 DAYS Performed at [facility], [address], [place], [place] [address]    Report Status 05/24/2023 FINAL  Final  Surgical PCR screen     Status: None   Collection Time: 05/24/23  9:22 PM   Specimen: Nasal Mucosa; Nasal Swab  Result Value Ref Range Status   MRSA, PCR NEGATIVE NEGATIVE Final   Staphylococcus aureus NEGATIVE NEGATIVE Final    Comment: (NOTE) The Xpert SA Assay (FDA approved for NASAL specimens in patients 46 years of age and older), is one component of a comprehensive surveillance program. It is not intended to diagnose infection nor to guide or monitor treatment. Performed at [facility], [address] [place], [place] [address]   Aerobic/Anaerobic Culture w Gram Stain (surgical/deep wound)     Status: None (Preliminary result)   Collection Time: 05/25/23 10:15 AM   Specimen: PATH Bone biopsy; Tissue  Result Value Ref Range Status   Specimen Description TISSUE  Final   Special Requests RIGHT DISTAL PHALANX  Final   Gram Stain NO WBC SEEN NO ORGANISMS SEEN   Final   Culture   Final    NO GROWTH < 24 HOURS Performed at [facility], [address] [place], [place] [address]    Report Status PENDING   Incomplete     Radiology Studies: [name] EKG SITE RITE Result Date: 05/25/2023 If [facility] image not attached, placement could  not be confirmed due to current cardiac rhythm.   Scheduled Meds:  [MAR Hold] sodium chloride   Intravenous Once   [MAR Hold] atorvastatin  40 mg Oral Daily   [MAR Hold] Chlorhexidine Gluconate Cloth  6 each Topical Daily   [MAR Hold] clopidogrel  75 mg Oral Daily   [MAR Hold] ferrous sulfate  325 mg Oral Q breakfast   [MAR Hold] hydrALAZINE  25 mg Oral BID   [MAR Hold] insulin aspart  0-9 Units Subcutaneous TID WC   [MAR Hold] insulin aspart  5 Units Subcutaneous TID WC   [MAR Hold] insulin glargine-yfgn  10 Units Subcutaneous BID   [MAR Hold] isosorbide mononitrate  60 mg Oral Daily   [MAR Hold] levothyroxine  125 mcg Oral Q0600   [MAR Hold] metoprolol succinate  50 mg Oral Daily   [MAR Hold] pantoprazole  40 mg Oral Daily   [MAR Hold] sodium bicarbonate  650 mg Oral BID   [MAR Hold] topiramate  50 mg Oral BID   Continuous Infusions:  sodium chloride 75 mL/hr at 05/26/23 0914   [MAR Hold] cefTRIAXone (ROCEPHIN)  IV 2 g (05/24/23 1455)   [MAR Hold] linezolid (ZYVOX) IV 600 mg (05/26/23 0915)     LOS: 9 days   Burnadette Pop, MD [facility] P1/23/2025, 11:56 AM

## 2023-05-26 NOTE — Progress Notes (Signed)
BLE vein mapping has been completed.   Results can be found under chart review under CV PROC. 05/26/2023 4:19 PM Wendolyn Raso RVT, RDMS

## 2023-05-26 NOTE — Progress Notes (Signed)
PT Cancellation Note  Patient Details Name: Vanellope Widing MRN: [id number] DOB: 07/30/1980   Cancelled Treatment:    Reason Eval/Treat Not Completed: Patient at procedure or test/unavailable (plan for RLE angiogram)  Lillia Pauls, PT, DPT Acute Rehabilitation Services Office [phone]    Norval Morton 05/26/2023, 10:44 AM

## 2023-05-26 NOTE — Progress Notes (Signed)
EEG complete - results pending 

## 2023-05-26 NOTE — Progress Notes (Signed)
Patient brought to 4E from cath lab. VSS. Telemetry box applied, CCMD notified. Patient oriented to room and staff. Call bell in reach.  Daymon Larsen, RN

## 2023-05-26 NOTE — Op Note (Signed)
Patient name: Lauren [name] MRN: [id number] DOB: 06/18/1980 Sex: female  05/26/2023 Pre-operative Diagnosis: Critical limb ischemia right lower extremity with tissue loss Post-operative diagnosis:  Same Surgeon:  Cephus Shelling, MD Procedure Performed: 1.  Ultrasound-guided access left common femoral artery 2.  CO2 aortogram with catheter selection of aorta 3.  Right lower extremity arteriogram with catheter selection of the above-knee popliteal artery 4.  46 minutes of monitored moderate conscious sedation time  Indications: 43 year old female admitted with multiple issues including right foot wound with tissue loss.  She presents today for lower extremity arteriogram after risk benefits discussed.  Contrast: 15 mL, otherwise CO2 for case  Findings:   US guided access left common femoral artery.  CO2 aortogram was performed given stage IV CKD that showed a patent infrarenal aorta with patent iliacs without flow-limiting stenosis.    On the right she had a patent common femoral and profunda.  The SFA is patent and she has some disease and particularly in the distal SFA without any flow-limiting stenosis.  The right above knee popliteal artery is occluded.  We had to use some limited contrast for a total of 15 mL given a right above-knee popliteal occlusion and we could not evaluate distal runoff with CO2.  She reconstitutes an anterior tibial and peroneal.  Dominant runoff in the foot is the anterior tibial widely patent into the foot through the dorsalis pedis.  During the procedure patient became unresponsive and desatted into the 60s.  When she awoke noted to have slurred speech with some left facial droop.  Rapid response was called and also code stroke.     Procedure:  The patient was identified in the holding area and taken to room 8.  The patient was then placed supine on the table and prepped and draped in the usual sterile fashion.  A time out was called.  The patient  received Versed and fentanyl for conscious moderate sedation.  Vital signs were monitored including heart rate, respiratory rate, oxygenation and blood pressure.  I was present for all of moderate sedation.  Ultrasound was used to evaluate the left common femoral artery.  It was patent .  A digital ultrasound image was acquired.  A micropuncture needle was used to access the left common femoral artery under ultrasound guidance.  An 018 wire was advanced without resistance and a micropuncture sheath was placed.  The 018 wire was removed and a benson wire was placed.  The micropuncture sheath was exchanged for a 5 french sheath.  An omniflush catheter was advanced over the wire to the level of L-1.  An abdominal angiogram was obtained with CO2.  Next, using the omniflush catheter and a benson wire, the aortic bifurcation was crossed and the catheter was placed into theright external iliac artery and right runoff was obtained.  Ultimately we used CO2 noting a right above-knee popliteal occlusion.  We could not see runoff below the knee.  I then used a straight flush catheter on the right leg.  During this time patient became unresponsive and desatted into the 60s.  We did bag mask ventilation and got her sats up.  When she awoke she had some slurred speech and left-sided weakness that improved.  We did call rapid response and a code stroke.  We were able to get several shots of her lower extremity runoff with very limited contrast to evaluate for distal target.  Wires and catheters were removed.  Sheath was removed from the left  groin and pressure held.  Plan: Patient will need evaluation for right femoral to AT bypass.  Vein mapping ordered.  Cephus Shelling, MD [facility] [place] Office: [phone]  Cephus Shelling

## 2023-05-26 NOTE — Progress Notes (Signed)
Pt is currently in the Cath Lab and will be transferred to 4E03 soon.

## 2023-05-26 NOTE — Progress Notes (Addendum)
  Progress Note    05/26/2023 8:54 AM 1 Day Post-Op  Subjective:  says her right foot feels sore    Vitals:   05/26/23 0307 05/26/23 0830  BP: 130/87 (!) 88/41  Pulse: 99 91  Resp: 18 18  Temp: 98.2 F (36.8 C) 98.1 F (36.7 C)  SpO2: 98% 100%    Physical Exam: General:  resting comfortably Lungs:  nonlabored Incisions:  right foot bandaged and dry  CBC    Component Value Date/Time   WBC 11.8 (H) 05/26/2023 0429   RBC 2.78 (L) 05/26/2023 0429   HGB 7.8 (L) 05/26/2023 0429   HGB 12.3 02/18/2023 1032   HCT 25.3 (L) 05/26/2023 0429   HCT 38.4 02/18/2023 1032   PLT 264 05/26/2023 0429   PLT 347 02/18/2023 1032   MCV 91.0 05/26/2023 0429   MCV 93 02/18/2023 1032   MCH 28.1 05/26/2023 0429   MCHC 30.8 05/26/2023 0429   RDW 16.7 (H) 05/26/2023 0429   RDW 14.0 02/18/2023 1032   LYMPHSABS 1.9 05/17/2023 1745   LYMPHSABS 1.9 08/11/2022 1510   MONOABS 0.7 05/17/2023 1745   EOSABS 0.2 05/17/2023 1745   EOSABS 0.1 08/11/2022 1510   BASOSABS 0.0 05/17/2023 1745   BASOSABS 0.1 08/11/2022 1510    BMET    Component Value Date/Time   NA 130 (L) 05/26/2023 0429   NA 140 08/11/2022 1511   K 3.8 05/26/2023 0429   CL 100 05/26/2023 0429   CO2 20 (L) 05/26/2023 0429   GLUCOSE 316 (H) 05/26/2023 0429   BUN 22 (H) 05/26/2023 0429   BUN 37 (H) 08/11/2022 1511   CREATININE 2.12 (H) 05/26/2023 0429   CALCIUM 7.7 (L) 05/26/2023 0429   GFRNONAA 30 (L) 05/26/2023 0429   GFRAA 31 (L) 02/01/2020 1328    INR    Component Value Date/Time   INR 1.2 05/26/2023 0429   INR 3.4 09/30/2022 0911     Intake/Output Summary (Last 24 hours) at 05/26/2023 0854 Last data filed at 05/26/2023 0300 Gross per 24 hour  Intake 628.5 ml  Output 2 ml  Net 626.5 ml      Assessment/Plan:  43 y.o. female with right great toe osteomyelitis   -She is doing okay this morning after bone biopsy and debridement yesterday -Right foot is bandaged and dry -Bone biopsy results pending. Per  podiatry, pt may need prolonged IV abx if biopsy demonstrates osteomyelitis -She has been NPO past midnight. She is still agreeable to RLE angiogram today with [facility]   Loel Dubonnet, PA-C [facility] [phone] 05/26/2023 8:54 AM  I have seen and evaluated the patient. I agree with the PA note as documented above.  Plan aortogram with lower extremity arteriogram today for right foot tissue loss.  Has CKD 4 and will use CO2 as much as possible.  Cephus Shelling, MD [facility] [place] Office: [phone]

## 2023-05-26 NOTE — Code Documentation (Signed)
Lauren [name] is a 43 year old female currently in Cath lab undergoing lower extremity arteriogram. She became hypoxic, and afterwards had left weakness and droop. Inpatient code stroke was activated. She takes Coumadin, which has been on hold since 1/21.    Stroke team to Cath lab. Pt is still cannulated. BP, CBG WNL. Pt at first was drowsy, and dysarthric, with lt facial droop. (NIHSS 3). Exam rapidly improved to an NIHSS 0. (Exam limited by left arm broken and left leg cannulated/movement restricted). Upon completion of procedure, pt taken to CT scan. Per Dr. Iver Nestle, CT is negative for hemorrhage.     Pt returned to Cath lab holding. She will need q 2 hr VS and NIHSS for 12 hours, then q4. She is not a candidate for TNK as NIHSS 0. She is not a candidate for endovascular therapy as NIHSS 0. Handoff to [facility] complete.

## 2023-05-26 NOTE — Progress Notes (Signed)
Patient here in the Cath Lab for lower extremity arteriogram with a focus on the right leg for CLI with tissue loss.  This was only diagnostic imaging.  We were not in the aortic arch.  She got no heparin.  During the procedure she became unresponsive and then hypoxic into the 60s.  Required bag mask ventilation.  When she became more alert noted to have some slurred speech and left facial droop and left arm weakness.  Rapid response and a code stroke called.  That has all improved and she appears back to her baseline.  Stroke team is going to pursue further imaging.  The left femoral sheath was removed.  She will need evaluation for right tibial bypass next week.  Vein mapping ordered.  Cephus Shelling, MD [facility] [place] Office: [phone]   Cephus Shelling

## 2023-05-26 NOTE — Plan of Care (Signed)
Pt not seen due to vascular procedure.  Her pathology result is negative for osteomyelitis from the hallux. NGTD on culture.   No surgical intervention planned on right hallux. This is also what patient prefers per my discussions with her. Leave dressing c/d/I until follow up in 1 week.  Stable for discharge from my perspective.         Corinna Gab, DPM [facility]                   05/26/2023

## 2023-05-26 NOTE — Consult Note (Signed)
NEUROLOGY CONSULT NOTE   Date of service: May 26, 2023 Patient Name: Lauren [name] MRN:  [id number] DOB:  12/07/1980 Chief Complaint: "Unresponsiveness" Requesting Provider: Burnadette Pop, MD  History of Present Illness  Lauren [name] is a 43 y.o. female with hx of type 1 diabetes, stroke (was on Coumadin until several days ago for procedure), CKD stage IV, CAD, non-STEMI, HFrEF (EF ~30%), left fifth toe osteomyelitis and recent right humerus fracture who originally presented on 1/14 with chest discomfort, generalized feeling unwell decreased urine output and poor oral intake.  There was concern for osteomyelitis of the right foot, and patient underwent excisional debridement of right foot ulcer on 1/22.  On 1/23, she underwent right lower extremity arteriogram and became unresponsive and hypoxic during the procedure.  She required some bag mask ventilation and became alert again but was noted to have slurred speech with left facial droop and left arm weakness (grip weak).  Deficits improved quickly as patient's O2 saturations returned to baseline, and exam is currently nonfocal and at baseline  She has some reports of possible seizures documented in her chart.  She is on topiramate and apparently as needed clonazepam.  No seizure-like activity was reported by procedural team, however of note she did not get any Xanax yesterday (received 3 doses on 1/20 and 1 dose on 1/21), also missed a dose of her topiramate yesterday morning  LKW: 1140 Modified rankin score: 4-Needs assistance to walk and tend to bodily needs IV Thrombolysis: No, deficits resolved EVT: No, exam not consistent with LVO and deficits resolved  NIHSS components Score: Comment  1a Level of Conscious 0[]  1[x]  2[]  3[]    Patient had recently received Versed for procedure  1b LOC Questions 0[x]  1[]  2[]       1c LOC Commands 0[x]  1[]  2[]       2 Best Gaze 0[x]  1[]  2[]       3 Visual 0[x]  1[]  2[]  3[]      4 Facial Palsy 0[x]  1[]   2[]  3[]      5a Motor Arm - left 0[]  1[]  2[]  3[]  4[]  UN[x]  Unable to perform full range of motion due to recent fracture and immobilization but able to lift left hand and grip with baseline strength  5b Motor Arm - Right 0[x]  1[]  2[]  3[]  4[]  UN[]    6a Motor Leg - Left 0[]  1[]  2[]  3[]  4[]  UN[x]  Unable to raise left leg due to recent groin sheath removal, able to wiggle toes dorsiflex and plantarflex foot  6b Motor Leg - Right 0[x]  1[]  2[]  3[]  4[]  UN[]    7 Limb Ataxia 0[x]  1[]  2[]  3[]  UN[]     8 Sensory 0[x]  1[]  2[]  UN[]      9 Best Language 0[x]  1[]  2[]  3[]      10 Dysarthria 0[x]  1[]  2[]  UN[]      11 Extinct. and Inattention 0[x]  1[]  2[]       TOTAL:1       ROS  Focused ROS in emergent setting, pertinent in HPI above  Past History   Past Medical History:  Diagnosis Date   Acute ischemic stroke (HCC) 01/01/2020   Acute kidney injury superimposed on CKD (HCC) 01/01/2020   Acute right MCA stroke (HCC) 01/28/2022   Anemia    CAD (coronary artery disease)    a. s/p cath in 03/2014 showing 30% mid-LAD, moderate to severe disease along small D1, patent LCx, moderate to severe distal OM2 stenosis and moderate diffuse diease along RCA not amenable to PCI   Cerebrovascular accident (CVA) (HCC)  12/31/2019   CHF (congestive heart failure) (HCC)    a. EF 55-60% in 12/2019 b. EF at 35-40% by echo in 05/2020   CKD (chronic kidney disease) stage 4, GFR 15-29 ml/min (HCC)    Diabetes mellitus without complication (HCC)    Myocardial infarction (HCC)    Neuropathy    PONV (postoperative nausea and vomiting)    PVD (peripheral vascular disease) (HCC)    Stroke (HCC) 01/2022   L hand weakness    Past Surgical History:  Procedure Laterality Date   ABDOMINAL AORTOGRAM W/LOWER EXTREMITY N/A 06/10/2022   Procedure: ABDOMINAL AORTOGRAM W/LOWER EXTREMITY;  Surgeon: Cephus Shelling, MD;  Location: [facility] IN[facility]ASIVE [facility];  Service: Cardiovascular;  Laterality: N/A;   AMPUTATION Left 09/02/2021    Procedure: AMPUTATION RAY;  Surgeon: Edwin Cap, DPM;  Location: [facility] OR;  Service: Podiatry;  Laterality: Left;  sagittal saw, 3L bag saline & Pulse   BONE BIOPSY Right 05/25/2023   Procedure: BONE BIOPSY OF RIGHT GREAT TOE;  Surgeon: Pilar Plate, DPM;  Location: [facility] OR;  Service: Orthopedics/Podiatry;  Laterality: Right;  Great toe bone biopsy versus amputation   Cardiac catherization     CHOLECYSTECTOMY     I & D EXTREMITY Left 08/30/2021   Procedure: IRRIGATION AND DEBRIDEMENT WITH BONE BIOPSY;  Surgeon: Candelaria Stagers, DPM;  Location: [facility] OR;  Service: Podiatry;  Laterality: Left;   IR ANGIO VERTEBRAL SEL SUBCLAVIAN INNOMINATE UNI L MOD SED  01/18/2022   IR CT HEAD LTD  01/08/2022   IR INTRA CRAN STENT  01/08/2022   IR PERCUTANEOUS ART THROMBECTOMY/INFUSION INTRACRANIAL INC DIAG ANGIO  01/08/2022   IRRIGATION AND DEBRIDEMENT FOOT Left 09/04/2021   Procedure: IRRIGATION AND DEBRIDEMENT FOOT AND CLOSURE;  Surgeon: Edwin Cap, DPM;  Location: [facility] OR;  Service: Podiatry;  Laterality: Left;   LEFT HEART CATH AND CORONARY ANGIOGRAPHY N/A 07/26/2022   Procedure: LEFT HEART CATH AND CORONARY ANGIOGRAPHY;  Surgeon: [place]il[place], Peter M, MD;  Location: [facility] INVASIVE CV LAB;  Service: Cardiovascular;  Laterality: N/A;   METATARSAL OSTEOTOMY Left 07/21/2022   Procedure: LEFT FOOT EXCISION OF FIFTH METATARSAL WITH DEBRIDEMENT OF ULCER, POSSIBLE BONE BIOPSY OF THE FOURTH METATARSAL, POSSIBLE FOURTH RAY AMPUTATION OF LEFT TOEAND METATARSAL;  Surgeon: Vivi Barrack, DPM;  Location: [facility] OR;  Service: Podiatry;  Laterality: Left;   PERIPHERAL VASCULAR BALLOON ANGIOPLASTY Left 06/10/2022   Procedure: PERIPHERAL VASCULAR BALLOON ANGIOPLASTY;  Surgeon: Cephus Shelling, MD;  Location: [facility];  Service: Cardiovascular;  Laterality: Left;  AT and DP   PERIPHERAL VASCULAR INTERVENTION Left 06/10/2022   Procedure: PERIPHERAL VASCULAR INTERVENTION;  Surgeon: Cephus Shelling, MD;   Location: [facility] INVASIVE CV LAB;  Service: Cardiovascular;  Laterality: Left;  SFA   RADIOLOGY WITH ANESTHESIA N/A 01/08/2022   Procedure: IR WITH ANESTHESIA;  Surgeon: Radiologist, Medication, MD;  Location: [facility] OR;  Service: Radiology;  Laterality: N/A;   THROMBECTOMY BRACHIAL ARTERY Right 11/18/2021   Procedure: RIGHT BRACHIAL, RADIAL, & ULNAR ARTERY THROMBECTOMY.;  Surgeon: Cephus Shelling, MD;  Location: [facility];  Service: Vascular;  Laterality: Right;   TUBAL LIGATION      Family History: Family History  Problem Relation Age of Onset   Thyroid disease Mother    Hypertension Mother    Hyperlipidemia Mother    CAD Mother    CVA Mother    Hyperlipidemia Father    Hypertension Father    CAD Father    Breast cancer Paternal  Grandmother 45   Cancer Paternal Grandmother        lung    Social History  reports that she quit smoking about 16 months ago. Her smoking use included cigarettes. She started smoking about 16 years ago. She has a 15 pack-year smoking history. She has been exposed to tobacco smoke. She has never used smokeless tobacco. She reports current alcohol use. She reports that she does not use drugs.  Allergies  Allergen Reactions   Tape Other (See Comments)    Skin comes off with any adhesive   Visipaque [Iodixanol] Nausea And Vomiting    Patient had vagal response during procedure became hypertensive, and bradycardic. CO2 used during procedure prior to contrast being used, this may be cause as well. Recommended premedicating prior to contrast being used in the future.    Wellbutrin [Bupropion] Hives   Gabapentin     dizzy   Ciprofloxacin Hcl Hives and Rash    Hives/rash at injection site; 01/15/22 tolerated IV cipro    Medications   Current Facility-Administered Medications:    [MAR Hold] 0.9 %  sodium chloride infusion (Manually program via Guardrails IV Fluids), , Intravenous, Once, Standiford, Alexander F, DPM   0.9 %  sodium chloride infusion, ,  Intravenous, Continuous, Adhikari, Amrit, MD, Last Rate: 75 mL/hr at 05/26/23 0914, New Bag at 05/26/23 0914   [MAR Hold] acetaminophen (TYLENOL) tablet 650 mg, 650 mg, Oral, Q6H PRN, 650 mg at 05/25/23 0002 **OR** [MAR Hold] acetaminophen (TYLENOL) suppository 650 mg, 650 mg, Rectal, Q6H PRN, Adefeso, Oladapo, DO   [MAR Hold] ALPRAZolam (XANAX) tablet 0.25 mg, 0.25 mg, Oral, TID PRN, Hazeline Junker B, MD, 0.25 mg at 05/24/23 2106   [facility] Hold] atorvastatin (LIPITOR) tablet 40 mg, 40 mg, Oral, Daily, Adefeso, Oladapo, DO, 40 mg at 05/26/23 0917   [MAR Hold] cefTRIAXone (ROCEPHIN) 2 g in sodium chloride 0.9 % 100 mL IVPB, 2 g, Intravenous, Q24H, Hazeline Junker B, MD, Last Rate: 200 mL/hr at 05/24/23 1455, 2 g at 05/24/23 1455   [MAR Hold] Chlorhexidine Gluconate Cloth 2 % PADS 6 each, 6 each, Topical, Daily, Adhikari, Amrit, MD, 6 each at 05/26/23 0907   [facility] Hold] clopidogrel (PLAVIX) tablet 75 mg, 75 mg, Oral, Daily, Adefeso, Oladapo, DO, 75 mg at 05/24/23 0837   [MAR Hold] diphenhydrAMINE (BENADRYL) capsule 25 mg, 25 mg, Oral, Q6H PRN, [name], MD   fentaNYL (SUBLIMAZE) injection, , , PRN, Cephus Shelling, MD, 25 mcg at 05/26/23 1120   [MAR Hold] ferrous sulfate tablet 325 mg, 325 mg, Oral, Q breakfast, Adhikari, Amrit, MD, 325 mg at 05/26/23 0917   Heparin (Porcine) in NaCl 1000-0.9 UT/500ML-% SOLN, , , PRN, Cephus Shelling, MD, 500 mL at 05/26/23 1118   [MAR Hold] hydrALAZINE (APRESOLINE) tablet 25 mg, 25 mg, Oral, BID, Hazeline Junker B, MD, 25 mg at 05/24/23 2100   [facility] Hold] insulin aspart (novoLOG) injection 0-9 Units, 0-9 Units, Subcutaneous, TID WC, Hazeline Junker B, MD, 3 Units at 05/26/23 0906   [facility] Hold] insulin aspart (novoLOG) injection 5 Units, 5 Units, Subcutaneous, TID WC, Amin, Tilman Neat, MD, 5 Units at 05/25/23 1800   [MAR Hold] insulin glargine-yfgn (SEMGLEE) injection 10 Units, 10 Units, Subcutaneous, BID, [name], MD, 10 Units at 05/26/23 0907   iodixanol (VISIPAQUE) 320  MG/ML injection, , , PRN, Cephus Shelling, MD, 15 mL at 05/26/23 1206   [MAR Hold] isosorbide mononitrate (IMDUR) 24 hr tablet 60 mg, 60 mg, Oral, Daily, Tyrone Nine, MD,  60 mg at 05/24/23 0837   [MAR Hold] levothyroxine (SYNTHROID) tablet 125 mcg, 125 mcg, Oral, Q0600, Adefeso, Oladapo, DO, 125 mcg at 05/26/23 0451   lidocaine (PF) (XYLOCAINE) 1 % injection, , , PRN, Cephus Shelling, MD, 15 mL at 05/26/23 1121   [MAR Hold] linezolid (ZYVOX) IVPB 600 mg, 600 mg, Intravenous, Q12H, Tyrone Nine, MD, Last Rate: 300 mL/hr at 05/26/23 0915, 600 mg at 05/26/23 0915   [MAR Hold] metoprolol succinate (TOPROL-XL) 24 hr tablet 50 mg, 50 mg, Oral, Daily, Hazeline Junker B, MD, 50 mg at 05/24/23 0837   midazolam (VERSED) injection, , , PRN, Cephus Shelling, MD, 1 mg at 05/26/23 1120   [MAR Hold] oxyCODONE-acetaminophen (PERCOCET/ROXICET) 5-325 MG per tablet 1 tablet, 1 tablet, Oral, Q4H PRN, Adefeso, Oladapo, DO, 1 tablet at 05/26/23 0906   [MAR Hold] pantoprazole (PROTONIX) EC tablet 40 mg, 40 mg, Oral, Daily, Adefeso, Oladapo, DO, 40 mg at 05/26/23 0905   [MAR Hold] prochlorperazine (COMPAZINE) injection 10 mg, 10 mg, Intravenous, Q6H PRN, Adefeso, Oladapo, DO   [MAR Hold] sodium bicarbonate tablet 650 mg, 650 mg, Oral, BID, Hazeline Junker B, MD, 650 mg at 05/26/23 0906   [facility] Hold] sodium chloride flush (NS) 0.9 % injection 10-40 mL, 10-40 mL, Intracatheter, PRN, Burnadette Pop, MD   [MAR Hold] topiramate (TOPAMAX) tablet 50 mg, 50 mg, Oral, BID, Hazeline Junker B, MD, 50 mg at 05/26/23 0905   [MAR Hold] traZODone (DESYREL) tablet 100 mg, 100 mg, Oral, QHS PRN, Hazeline Junker B, MD, 100 mg at 05/25/23 2212  Vitals   Vitals:   05/26/23 0830 05/26/23 0904 05/26/23 1112 05/26/23 1300  BP: (!) 88/41 (!) 138/91  118/73  Pulse: 91   (!) 112  Resp: 18   13  Temp: 98.1 F (36.7 C)     TempSrc:      SpO2: 100%  100% 99%  Weight:      Height:        Body mass index is 35.07 kg/m.  Physical Exam    Constitutional: Chronically ill-appearing patient with left arm in sling in no acute distress, under sterile field on initial exam Psych: Tearful and anxious at times Eyes: No scleral injection.  HENT: No OP obstruction.  Head: Normocephalic.  Cardiovascular: Normal rate and regular rhythm.  Respiratory: Effort normal, non-labored breathing on supplemental O2 Skin: Bandages noted to right foot and calf, vascular access in place on initial exam  Neurologic Examination   Full exam after vascular diagnostic procedure completed   NEURO:  Mental Status: Drowsy but becoming more alert as time goes on, oriented to person place time and situation, occasionally states her name is Lauren [name] but will correct herself and say that this is her sister's name Speech/Language: speech is without dysarthria or aphasia.  Naming, repetition, fluency, and comprehension intact.  Cranial Nerves:  II: PERRL. Visual fields full.  III, IV, VI: EOMI. Eyelids elevate symmetrically.  V: Sensation is intact to light touch and symmetrical to face.  VII: Smile is symmetrical.  VIII: hearing intact to voice. IX, X: Phonation is normal.  XII: tongue is midline without fasciculations. Motor: Able to move right upper extremity with normal antigravity strength, range of motion of left arm limited due to recent fracture but able to grip with baseline strength, similar to before episode of unresponsiveness, able to move right lower extremity with good antigravity strength, unable to assess hip flexion of left lower extremity due to recent groin sheath removal but able  to wiggle toes dorsiflex and plantarflex Tone: is normal and bulk is normal Sensation- Intact to light touch bilaterally. Extinction absent to light touch to DSS.  Coordination: FTN intact on the right, heel-knee-shin unable to perform due to recent access and bandages in place Gait- deferred   Labs/Imaging/Neurodiagnostic studies   CBC:  Recent Labs   Lab 05/25/23 0611 05/26/23 0429  WBC 8.5 11.8*  HGB 7.3* 7.8*  HCT 24.2* 25.3*  MCV 90.3 91.0  PLT 320 264   Basic Metabolic Panel:  Lab Results  Component Value Date   NA 130 (L) 05/26/2023   K 3.8 05/26/2023   CO2 20 (L) 05/26/2023   GLUCOSE 316 (H) 05/26/2023   BUN 22 (H) 05/26/2023   CREATININE 2.12 (H) 05/26/2023   CALCIUM 7.7 (L) 05/26/2023   GFRNONAA 30 (L) 05/26/2023   GFRAA 31 (L) 02/01/2020   Lipid Panel:  Lab Results  Component Value Date   LDLCALC 57 07/26/2022   HgbA1c:  Lab Results  Component Value Date   HGBA1C 8.7 (H) 04/22/2023   Urine Drug Screen:     Component Value Date/Time   LABOPIA NONE DETECTED 05/17/2023 2005   COCAINSCRNUR NONE DETECTED 05/17/2023 2005   LABBENZ NONE DETECTED 05/17/2023 2005   AMPHETMU NONE DETECTED 05/17/2023 2005   THCU NONE DETECTED 05/17/2023 2005   LABBARB NONE DETECTED 05/17/2023 2005    Alcohol Level     Component Value Date/Time   ETH <10 05/17/2023 1745   INR  Lab Results  Component Value Date   INR 1.2 05/26/2023   APTT  Lab Results  Component Value Date   APTT 77 (H) 05/05/2023   AED levels: No results found for: "PHENYTOIN", "ZONISAMIDE", "LAMOTRIGINE", "LEVETIRACETA"  CT Head without contrast(Personally reviewed): No acute abnormality, chronic right MCA territory infarct  MRI Brain(Personally reviewed): Pending  ASSESSMENT   Lauren [name] is a 43 y.o. female with history of stroke, CKD stage IV, CAD, non-STEMI, CHF, left fifth toe osteomyelitis status post amputation, and recent left humerus fracture who originally presented with chest discomfort, poor oral intake, feeling poorly and decreased urine output.  Patient was found to have questionable osteomyelitis of right foot, and debridement of right leg wound was performed on 1/22 in the OR.  Patient was taking Coumadin, but this was on hold for her procedure today.  Patient underwent right lower extremity arteriogram on 1/23, and during the  procedure, she became unresponsive and hypoxic.  Bag-valve-mask ventilations were performed, and patient became alert again but had a left facial droop and slurred speech.  However, as her oxygen saturations returned to normal, deficits resolved.  CT head revealed no acute abnormality, and patient appeared to have returned to her preprocedural baseline.  Will obtain brain MRI given symptoms and stroke risk factors and proceed with stroke workup if positive.  Suspect recrudescence of prior stroke symptoms secondary to brief hypoxic event but need to rule out acute stroke fully Seizure activity was not described/witnessed by team but with history of possible seizures will obtain routine EEG  RECOMMENDATIONS  -Brain MRI without contrast to exclude acute intracranial process with more sensitivity and confirm safety of resuming anticoagulation (when ready from vascular perspective) -Neurochecks and NIHSS every 2 hours pending MRI brain, may discontinue if MRI negative --Routine EEG due to history of possible seizures, without EEG being completed previously in the chart --Continue home topiramate and benzodiazepines at this time -Neurology will follow up MRI brain and EEG, otherwise will sign off at this  time. Please reach out with any additional questions/concerns. Discussed with primary team via secure chat ______________________________________________________________________    Signed, Cortney Harland Dingwall, NP [facility]   Attending Neurologist's note:  I personally saw this patient, gathering history, performing a full neurologic examination, reviewing relevant labs, personally reviewing relevant imaging including head CT, and formulated the assessment and plan, adding the note above for completeness and clarity to accurately reflect my thoughts   Brooke Dare MD-PhD [facility] [phone] Available 7 AM to 7 PM, outside these hours please contact Neurologist on  call listed on AMION   CRITICAL CARE Performed by: Gordy Councilman   Total critical care time: 30 minutes  Critical care time was exclusive of separately billable procedures and treating other patients.  Critical care was necessary to treat or prevent imminent or life-threatening deterioration -- emergent evaluation for consideration of thrombectomy or thrombolytic  Critical care was time spent personally by me on the following activities: development of treatment plan with patient and/or surrogate as well as nursing, discussions with consultants, evaluation of patient's response to treatment, examination of patient, obtaining history from patient or surrogate, ordering and performing treatments and interventions, ordering and review of laboratory studies, ordering and review of radiographic studies, pulse oximetry and re-evaluation of patient's condition.

## 2023-05-27 ENCOUNTER — Inpatient Hospital Stay (HOSPITAL_COMMUNITY): Payer: 59

## 2023-05-27 ENCOUNTER — Encounter (HOSPITAL_COMMUNITY): Payer: Self-pay | Admitting: Vascular Surgery

## 2023-05-27 DIAGNOSIS — N179 Acute kidney failure, unspecified: Secondary | ICD-10-CM | POA: Diagnosis not present

## 2023-05-27 DIAGNOSIS — R569 Unspecified convulsions: Secondary | ICD-10-CM | POA: Diagnosis not present

## 2023-05-27 DIAGNOSIS — N184 Chronic kidney disease, stage 4 (severe): Secondary | ICD-10-CM | POA: Diagnosis not present

## 2023-05-27 LAB — CBC
HCT: 26 % — ABNORMAL LOW (ref 36.0–46.0)
Hemoglobin: 8.1 g/dL — ABNORMAL LOW (ref 12.0–15.0)
MCH: 28 pg (ref 26.0–34.0)
MCHC: 31.2 g/dL (ref 30.0–36.0)
MCV: 90 fL (ref 80.0–100.0)
Platelets: 289 10*3/uL (ref 150–400)
RBC: 2.89 MIL/uL — ABNORMAL LOW (ref 3.87–5.11)
RDW: 17.1 % — ABNORMAL HIGH (ref 11.5–15.5)
WBC: 10.3 10*3/uL (ref 4.0–10.5)
nRBC: 0 % (ref 0.0–0.2)

## 2023-05-27 LAB — BASIC METABOLIC PANEL
Anion gap: 9 (ref 5–15)
BUN: 22 mg/dL — ABNORMAL HIGH (ref 6–20)
CO2: 21 mmol/L — ABNORMAL LOW (ref 22–32)
Calcium: 8.2 mg/dL — ABNORMAL LOW (ref 8.9–10.3)
Chloride: 107 mmol/L (ref 98–111)
Creatinine, Ser: 2.21 mg/dL — ABNORMAL HIGH (ref 0.44–1.00)
GFR, Estimated: 28 mL/min — ABNORMAL LOW (ref >60)
Glucose, Bld: 284 mg/dL — ABNORMAL HIGH (ref 70–99)
Potassium: 4.5 mmol/L (ref 3.5–5.1)
Sodium: 137 mmol/L (ref 135–145)

## 2023-05-27 LAB — GLUCOSE, CAPILLARY
Glucose-Capillary: 318 mg/dL — ABNORMAL HIGH (ref 70–99)
Glucose-Capillary: 177 mg/dL — ABNORMAL HIGH (ref 70–99)
Glucose-Capillary: 240 mg/dL — ABNORMAL HIGH (ref 70–99)
Glucose-Capillary: 123 mg/dL — ABNORMAL HIGH (ref 70–99)
Glucose-Capillary: 35 mg/dL — CL (ref 70–99)
Glucose-Capillary: 94 mg/dL (ref 70–99)

## 2023-05-27 LAB — PROTIME-INR
INR: 1.1 (ref 0.8–1.2)
Prothrombin Time: 14.2 s (ref 11.4–15.2)

## 2023-05-27 LAB — HEPARIN LEVEL (UNFRACTIONATED): Heparin Unfractionated: 0.17 [IU]/mL — ABNORMAL LOW (ref 0.30–0.70)

## 2023-05-27 MED ORDER — LORAZEPAM 2 MG/ML IJ SOLN
1.0000 mg | Freq: Once | INTRAMUSCULAR | Status: AC
  Administered 2023-05-27: 1 mg via INTRAVENOUS
  Filled 2023-05-27: qty 1

## 2023-05-27 MED ORDER — DOXYCYCLINE HYCLATE 100 MG PO TABS
100.0000 mg | ORAL_TABLET | Freq: Two times a day (BID) | ORAL | Status: DC
  Administered 2023-05-27 – 2023-05-28 (×3): 100 mg via ORAL
  Filled 2023-05-27 (×3): qty 1

## 2023-05-27 MED ORDER — CEPHALEXIN 500 MG PO CAPS
500.0000 mg | ORAL_CAPSULE | Freq: Three times a day (TID) | ORAL | Status: DC
  Administered 2023-05-27 – 2023-05-28 (×4): 500 mg via ORAL
  Filled 2023-05-27 (×4): qty 1

## 2023-05-27 MED ORDER — HEPARIN (PORCINE) 25000 UT/250ML-% IV SOLN
1500.0000 [IU]/h | INTRAVENOUS | Status: DC
  Administered 2023-05-27: 1200 [IU]/h via INTRAVENOUS
  Administered 2023-05-28: 1500 [IU]/h via INTRAVENOUS
  Filled 2023-05-27 (×2): qty 250

## 2023-05-27 NOTE — Progress Notes (Signed)
   05/27/23 1536  What Happened  Was fall witnessed? Yes  Who witnessed fall? Makiya,NT  Patients activity before fall ambulating-assisted  Point of contact buttocks  Was patient injured? No  Provider Notification  Provider Name/Title Amirit Adhikari  Date Provider Notified 05/27/23  Time Provider Notified 1551  Method of Notification  (message)  Notification Reason Fall  Provider response No new orders  Date of Provider Response 05/27/23  Time of Provider Response 1552  Adult Fall Risk Assessment  Risk Factor Category (scoring not indicated) High fall risk per protocol (document High fall risk)  Patient Fall Risk Level High fall risk  Adult Fall Risk Interventions  Required Bundle Interventions *See Row Information* High fall risk - low, moderate, and high requirements implemented  Screening for Fall Injury Risk (To be completed on HIGH fall risk patients) - Assessing Need for Floor Mats  Risk For Fall Injury- Criteria for Floor Mats None identified - No additional interventions needed  Will Implement Floor Mats Yes  Vitals  Temp 98.6 F (37 C)  Temp Source Oral  BP 107/71  MAP (mmHg) 81  BP Location Left Arm  BP Method Automatic  Patient Position (if appropriate) Lying  Pulse Rate 84  Pulse Rate Source Monitor  ECG Heart Rate 83  Resp 17  Oxygen Therapy  SpO2 94 %  O2 Device Room Air  Neurological  Level of Consciousness Alert   Bedside Water engineer aware. Gladys Deckard, Randall An RN

## 2023-05-27 NOTE — Progress Notes (Signed)
PHARMACY - ANTICOAGULATION CONSULT NOTE  Pharmacy Consult for warfarin Indication: CVA  Allergies  Allergen Reactions   Tape Other (See Comments)    Skin comes off with any adhesive   Visipaque [Iodixanol] Nausea And Vomiting    Patient had vagal response during procedure became hypertensive, and bradycardic. CO2 used during procedure prior to contrast being used, this may be cause as well. Recommended premedicating prior to contrast being used in the future.    Wellbutrin [Bupropion] Hives   Gabapentin     dizzy   Ciprofloxacin Hcl Hives and Rash    Hives/rash at injection site; 01/15/22 tolerated IV cipro    Patient Measurements: Height: 5\' 5"  (165.1 cm) Weight: 95.6 kg (210 lb 12.2 oz) IBW/kg (Calculated) : 57 Heparin Dosing Weight:   Vital Signs: Temp: 97.9 F (36.6 C) (01/24 1112) Temp Source: Oral (01/24 1112) BP: 120/51 (01/24 1112) Pulse Rate: 111 (01/24 1112)  Labs: Recent Labs    05/25/23 0611 05/26/23 0429 05/27/23 0435  HGB 7.3* 7.8* 8.1*  HCT 24.2* 25.3* 26.0*  PLT 320 264 289  LABPROT 15.9* 15.3* 14.2  INR 1.3* 1.2 1.1  CREATININE 1.89* 2.12* 2.21*    Estimated Creatinine Clearance: 38.7 mL/min (A) (by C-G formula based on SCr of 2.21 mg/dL (H)).   Medical History: Past Medical History:  Diagnosis Date   Acute ischemic stroke (HCC) 01/01/2020   Acute kidney injury superimposed on CKD (HCC) 01/01/2020   Acute right MCA stroke (HCC) 01/28/2022   Anemia    CAD (coronary artery disease)    a. s/p cath in 03/2014 showing 30% mid-LAD, moderate to severe disease along small D1, patent LCx, moderate to severe distal OM2 stenosis and moderate diffuse diease along RCA not amenable to PCI   Cerebrovascular accident (CVA) (HCC) 12/31/2019   CHF (congestive heart failure) (HCC)    a. EF 55-60% in 12/2019 b. EF at 35-40% by echo in 05/2020   CKD (chronic kidney disease) stage 4, GFR 15-29 ml/min (HCC)    Diabetes mellitus without complication (HCC)     Myocardial infarction (HCC)    Neuropathy    PONV (postoperative nausea and vomiting)    PVD (peripheral vascular disease) (HCC)    Stroke (HCC) 01/2022   L hand weakness   Assessment: Pharmacy consulted to dose warfarin in patient with history of CVA. Home dose listed as 2.5 mg on Tue/Thu/Sat and 5 mg ROW with last dose listed as 1/12.  1/20 INR up to 2.9, mild nosebleed noted 1/19, no further nosebleeds. podiatry would like INR </= 1.5 in order to take for bone biopsy. Will need to hold coumadin and will give small dose of oral vit K 2.5mg    1/21 INR down to 1.9, okayed with Dr. Renford Dills to give another oral dose of vit K 1mg    1/24 INR 1.1 - s/p foot I&D and biopsy - warfarin remains on hold for VVS surgery next week Will cover with heparin for interim   Goal of Therapy:  INR 2-3 Monitor platelets by anticoagulation protocol: Yes   Plan:  Hold warfarin Heparin drip 1300 uts/hr Heparin level 6hr after drip started  Daily heparin level and CBC  Monitor s/s bleeding     Leota Sauers Pharm.D. CPP, BCPS Clinical Pharmacist [phone] 05/27/2023 1:54 PM

## 2023-05-27 NOTE — Progress Notes (Addendum)
Patient has a bed alarm and chair alarm on, call bell is at bedside, but patient is not compliant with the safety measures, she was trying to get up and walk to the bathroom without waiting for the assistance.  Patient was not happy with the safety measures to be followed, education provided several times.

## 2023-05-27 NOTE — Progress Notes (Addendum)
  Progress Note    05/27/2023 7:48 AM 1 Day Post-Op  Subjective:  She was unable to lay flat for MRI.  She does not believe she had a stroke.  She denies any further stroke like symptoms   Vitals:   05/26/23 2332 05/27/23 0509  BP: (!) 93/57 (!) 117/47  Pulse: (!) 109 (!) 106  Resp: 15 15  Temp: 98.7 F (37.1 C) 98.7 F (37.1 C)  SpO2: 100% 100%   Physical Exam: Lungs:  non labored Incisions:  L groin cath site without hematoma Extremities:  R foot dressing left in place Neurologic: No slurring speech; L arm motor deficit  CBC    Component Value Date/Time   WBC 10.3 05/27/2023 0435   RBC 2.89 (L) 05/27/2023 0435   HGB 8.1 (L) 05/27/2023 0435   HGB 12.3 02/18/2023 1032   HCT 26.0 (L) 05/27/2023 0435   HCT 38.4 02/18/2023 1032   PLT 289 05/27/2023 0435   PLT 347 02/18/2023 1032   MCV 90.0 05/27/2023 0435   MCV 93 02/18/2023 1032   MCH 28.0 05/27/2023 0435   MCHC 31.2 05/27/2023 0435   RDW 17.1 (H) 05/27/2023 0435   RDW 14.0 02/18/2023 1032   LYMPHSABS 1.9 05/17/2023 1745   LYMPHSABS 1.9 08/11/2022 1510   MONOABS 0.7 05/17/2023 1745   EOSABS 0.2 05/17/2023 1745   EOSABS 0.1 08/11/2022 1510   BASOSABS 0.0 05/17/2023 1745   BASOSABS 0.1 08/11/2022 1510    BMET    Component Value Date/Time   NA 137 05/27/2023 0435   NA 140 08/11/2022 1511   K 4.5 05/27/2023 0435   CL 107 05/27/2023 0435   CO2 21 (L) 05/27/2023 0435   GLUCOSE 284 (H) 05/27/2023 0435   BUN 22 (H) 05/27/2023 0435   BUN 37 (H) 08/11/2022 1511   CREATININE 2.21 (H) 05/27/2023 0435   CALCIUM 8.2 (L) 05/27/2023 0435   GFRNONAA 28 (L) 05/27/2023 0435   GFRAA 31 (L) 02/01/2020 1328    INR    Component Value Date/Time   INR 1.1 05/27/2023 0435   INR 3.4 09/30/2022 0911     Intake/Output Summary (Last 24 hours) at 05/27/2023 0748 Last data filed at 05/27/2023 0303 Gross per 24 hour  Intake 1507.92 ml  Output --  Net 1507.92 ml     Assessment/Plan:  43 y.o. female is s/p RLE  diagnostic angiogram complicated by intra-operative hypoxic event and stroke symptoms 1 Day Post-Op   L groin cath site without hematoma Subjectively, she denies stroke symptoms and is back to baseline.  She will attempt MR brain again today Vein mapping pending Tentative plan for R femoral to tibial bypass next week if cleared medically   Emilie Rutter, PA-C [facility] [phone] 05/27/2023 7:48 AM  I have seen and evaluated the patient. I agree with the PA note as documented above.  43 year old female with critical limb ischemia with tissue loss in the right leg.  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  Needs right leg tibial bypass.  Vein mapping ordered.  Posted for tibial bypass next Wednesday with myself.  Back to neurologic baseline after code stroke yesterday.  CT head did not show any acute hemorrhage or evidence of other acute finding.  Cephus Shelling, MD [facility] [place] Office: [phone]

## 2023-05-27 NOTE — TOC Progression Note (Signed)
Transition of Care [facility]) - Progression Note    Patient Details  Name: Lauren [name] MRN: [id number] Date of Birth: 06/20/1980  Transition of [facility]) CM/SW Contact  Eduard Roux, [place] Phone Number: 05/27/2023, 11:14 AM  Clinical Narrative:     Per chart review, CSW Shanda Bumps, noted on 05/23/23 barrier to SNF placement: see below-  2:15 CSW spoke with Lewayne Bunting rep who pulled pt's insurance coverage and noted pt has a $9000 out of pocket cost for SNF, with a $2500 a day copay until this is met. Pt has an $80 a visit copay for [facility] services. Pt was able to walk 123 feet min assist today. CM to discuss options with pt/ TOC will continue to follow.   TOC will continue to follow and assist with discharge planning.   Antony Blackbird, MSW, LCSW Clinical Social Worker    Expected Discharge Plan: Skilled Nursing Facility Barriers to Discharge: Continued Medical Work up (patient will have to pay out of pocket for SNF and or [facility])  Expected Discharge Plan and Services In-house Referral: Clinical Social Work Discharge Planning Services: CM Consult Post Acute Care Choice: Skilled Nursing Facility Living arrangements for the past 2 months: Single Family Home                                       Social Determinants of Health (SDOH) Interventions SDOH Screenings   Food Insecurity: Food Insecurity Present (05/21/2023)  Housing: Low Risk  (05/18/2023)  Transportation Needs: Unmet Transportation Needs (05/18/2023)  Utilities: Patient Declined (05/18/2023)  Alcohol Screen: Low Risk  (11/09/2022)  Depression (PHQ2-9): Low Risk  (02/18/2023)  Recent Concern: Depression (PHQ2-9) - Medium Risk (02/04/2023)  Financial Resource Strain: Low Risk  (11/09/2022)  Physical Activity: Inactive (03/03/2022)  Social Connections: Socially Integrated (03/03/2022)  Stress: No Stress Concern Present (03/03/2022)  Recent Concern: Stress - Stress Concern Present (12/22/2021)  Tobacco Use: Medium Risk  (05/25/2023)    Readmission Risk Interventions    05/18/2023    1:36 PM 04/22/2023    3:05 PM 03/16/2023   11:32 AM  Readmission Risk Prevention Plan  Transportation Screening Complete Complete Complete  Medication Review Oceanographer) Complete Complete Complete  PCP or Specialist appointment within 3-5 days of discharge  Not Complete Not Complete  HRI or Home Care Consult Complete Complete Complete  SW Recovery Care/Counseling Consult Complete Complete Complete  Palliative Care Screening Not Applicable Not Applicable Not Applicable  Skilled Nursing Facility Complete Not Applicable Not Applicable

## 2023-05-27 NOTE — Plan of Care (Signed)
  Problem: Fluid Volume: Goal: Ability to maintain a balanced intake and output will improve Outcome: Progressing   Problem: Nutritional: Goal: Maintenance of adequate nutrition will improve Outcome: Progressing Goal: Progress toward achieving an optimal weight will improve Outcome: Progressing   Problem: Metabolic: Goal: Ability to maintain appropriate glucose levels will improve Outcome: Progressing   Problem: Tissue Perfusion: Goal: Adequacy of tissue perfusion will improve Outcome: Progressing   Problem: Education: Goal: Knowledge of General Education information will improve Description: Including pain rating scale, medication(s)/side effects and non-pharmacologic comfort measures Outcome: Progressing

## 2023-05-27 NOTE — Progress Notes (Signed)
PT Cancellation Note  Patient Details Name: Grainne Knights MRN: [id number] DOB: December 04, 1980   Cancelled Treatment:    Reason Eval/Treat Not Completed: Patient at procedure or test/unavailable. Pt in MRI. PT to re-attempt as time allows.   Ilda Foil 05/27/2023, 8:47 AM

## 2023-05-27 NOTE — Progress Notes (Signed)
Hypoglycemic Event  CBG: 35  Treatment: 8 oz juice/soda  Symptoms: None  Follow-up CBG: Time: 2231 CBG Result: 94  Possible Reasons for Event: Inadequate meal intake and Unknown  Comments/MD notified:No    Anne Ng

## 2023-05-27 NOTE — Progress Notes (Signed)
PROGRESS NOTE  Lauren [name]  NWG:[id number] DOB: 1980/09/15 DOA: 05/17/2023 PCP: Billie Lade, MD   Brief Narrative: Patient is a 43 y.o. 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   She had recent admits 04/21/23- 04/24/23 for DKA and UTI and 03/2023 with seizure, DKA, and AKI.  On presentation, she was  afebrile with soft BP, lab showed  WBC normal at 8.2k. AKI with Cr of  2.7 from baseline ~1.9. She had redness to the right foot, XR without significant osseous finding.MRI of the right foot 1/15 suggested acute osteomyelitis for which IV antibiotics were started, blood cultures ordered. Case discussed with the patient's podiatrist who suggested formal evaluation as inpatient for possible debridement, so transferred to [facility] from [facility].  Vascular surgery also consulted as per podiatry  recommendation.  PT recommending SNF on discharge.s/p bone biopsy, excisional debridement of right foot ulcer on 1/22.  Status post right lower extremity diagnostic angiogram by vascular surgery on 1/23.  While on procedure, she became hypoxic and developed stroke symptoms with unresponsiveness, facial droop.  Code stroke called.  MRI showed evidence of late subacute lacunar infarct in the left temporoparietal white matter.  Assessment & Plan:  Principal Problem:   Acute kidney injury superimposed on stage 4 chronic kidney disease (HCC) Active Problems:   Essential hypertension   Obesity, Class II, BMI 35-39.9   Acquired hypothyroidism   Hypokalemia   Atypical chest pain   Elevated troponin   GERD (gastroesophageal  reflux disease)   Dehydration   Type 2 diabetes mellitus with hypoglycemia (HCC)   Thrombocytosis   Closed fracture of left proximal humerus   Cellulitis of right lower extremity   Osteomyelitis of great toe of right foot (HCC)  Right foot cellulitis/myositis/suspected acute osteomyelitis: Started  on linezolid, ceftriaxone.  Blood cultures have been negative so far.MRI right foot showed cortical erosion and marrow edema consistent with acute osteomyelitis of the left great toe.  S/P right great toe bone biopsy, superficial wound debridement .  As per podiatry note, pathology result is negative for osteomyelitis.  No plan for further surgical intervention.  Changed  antibiotics to oral after discussion with podiatry.  Acute ischemic stroke:While on procedure, she became hypoxic and developed stroke symptoms with unresponsiveness, facial droop.  Code stroke called.  Neurology consulted.  MRI showed evidence of late subacute lacunar infarct in the left temporoparietal white matter.  EEG showed cortical dysfunction arising from the right hemisphere likely from underlying encephalomalacia but no seizure or epileptiform discharge.  Stroke team cleared the patient for planned vascular procedure.  Continue to hold  Coumadin  for planned vascular procedure, putting on IV heparin. She has history of CVA/carotid stenosis.Right hemisphere encephalomalacia noted on CT head with stent in right ICA.  Has chronic left hemiparesis, weakness more on the left upper extremity with chronic edema  Peripheral artery disease:Status post right lower extremity diagnostic angiogram by vascular surgery on 1/23.  Vascular surgery planning for right femoral to tibial bypass next week  Type 1 diabetes: On insulin pump at home.  Monitor blood sugars.  Continue current insulin regimen  AKI on CKD stage IV: Baseline creatinine around  1.9.  Presented with AKI.  On sodium bicarb.  Creatinine bumped up to 2.2 today, continue  gentle  iv fluid  Acute metabolic encephalopathy: Has short term  memory problem.  Continue delirium precaution, frequent reorientation.  Intermittently confused.  Currently alert oriented.  Normocytic anemia: Given unit of blood transfusion here.  Hemoglobin in the range of 8.  Also given a dose of IV iron. Likely from CKD.  Continue oral iron supplementation  Chronic combined HFrEF, HTN: Echo within the past month showed LVEF 30-35%. Continue metoprolol, hydralazine, isosorbide. Not on ACE/ARB/ARNI due to advanced CKD.  Coronary artery disease: On Plavix, statin, beta-blocker at home.  Continuing to follow-up with cardiology as an outpatient.  No anginal symptoms  Left humeral  fracture: Remains on sling.  Continue pain management.  Dr. Romeo Apple, orthopedics recommended outpatient follow-up in 2 weeks.  Has chronic edema of left upper extremity  Hypothyroidism: Continue Synthyroid  GERD: Continue PPI  Seizure disorder: on Topamax  Recurrent falls/debility: PT/OT recommending SNF on discharge. Has left hemiparesis  from CVA.  TOC following          DVT prophylaxis:SCDs Start: 05/18/23 0158     Code Status: Limited: Do not attempt resuscitation (DNR) -DNR-LIMITED -Do Not Intubate/DNI   Family Communication: Family  at the bedside on 1/22  Patient status:Inpatient  Patient is from :home  Anticipated discharge to:SNF  Estimated DC date: After full workup from  vascular surgery, bed availability at SnF   Consultants: Vascular surgery, podiatry  Procedures: Bone biopsy/debridement of right foot, right lower EXTR angiogram  Antimicrobials:  Anti-infectives (From admission, onward)    Start     Dose/Rate Route Frequency Ordered Stop   05/27/23 1000  doxycycline (VIBRA-TABS) tablet 100 mg        100 mg Oral Every 12 hours 05/27/23 0820 06/02/23 0959   05/27/23 0915  cephALEXin (KEFLEX) capsule 500 mg        500 mg Oral Every 8 hours 05/27/23 0820 06/02/23 0559   05/20/23 1400   vancomycin (VANCOREADY) IVPB 1250 mg/250 mL  Status:  Discontinued        1,250 mg 166.7 mL/hr over 90 Minutes Intravenous Every 48 hours 05/18/23 1337 05/19/23 1230   05/19/23 2200  linezolid (ZYVOX) IVPB 600 mg  Status:  Discontinued        600 mg 300 mL/hr over 60 Minutes Intravenous Every 12 hours 05/19/23 1230 05/27/23 0820   05/19/23 1400  cefTRIAXone (ROCEPHIN) 2 g in sodium chloride 0.9 % 100 mL IVPB  Status:  Discontinued        2 g 200 mL/hr over 30 Minutes Intravenous Every 24 hours 05/19/23 1230 05/27/23 0820   05/18/23 1430  vancomycin (VANCOREADY) IVPB 2000 mg/400 mL        2,000 mg 200 mL/hr over 120 Minutes Intravenous  Once 05/18/23 1331 05/18/23 2303   05/18/23 1430  ceFEPIme (MAXIPIME) 2 g in sodium chloride 0.9 % 100 mL IVPB  Status:  Discontinued        2 g 200 mL/hr over 30 Minutes Intravenous Every 12 hours 05/18/23 1338 05/19/23 1230       Subjective: Patient seen and examined at bedside today.  Hemodynamically stable.  She was on her feet standing inside the room when I arrived.  She does not have any deficits or weakness on the right side from the recently discovered stroke on the left.  On baseline mentation.  Denies any new complains  Objective: Vitals:  05/27/23 0820 05/27/23 1052 05/27/23 1053 05/27/23 1112  BP: 135/65 (!) 125/53 (!) 125/53 (!) 120/51  Pulse:   (!) 106 (!) 111  Resp: 15  12 16   Temp:    97.9 F (36.6 C)  TempSrc:    Oral  SpO2:   100% 100%  Weight:      Height:        Intake/Output Summary (Last 24 hours) at 05/27/2023 1324 Last data filed at 05/27/2023 1059 Gross per 24 hour  Intake 1750.92 ml  Output --  Net 1750.92 ml   Filed Weights   05/18/23 0639 05/21/23 2016 05/25/23 0822  Weight: 94 kg 95.6 kg 95.6 kg    Examination:  General exam: Overall comfortable, not in distress HEENT: PERRL Respiratory system:  no wheezes or crackles  Cardiovascular system: S1 & S2 heard, RRR.  Gastrointestinal system: Abdomen is  nondistended, soft and nontender. Central nervous system: Alert and oriented, weakness on the left side Extremities: Left upper extremity edema, sling on the left upper extremity, dressing on the right foot, PICC line on the right upper extremity Skin: Scattered bruises   Data Reviewed: I have personally reviewed following labs and imaging studies  CBC: Recent Labs  Lab 05/23/23 0625 05/23/23 1103 05/24/23 0623 05/25/23 0611 05/26/23 0429 05/27/23 0435  WBC 7.7  --  8.2 8.5 11.8* 10.3  HGB 7.4* 7.8* 7.8* 7.3* 7.8* 8.1*  HCT 24.1* 26.1* 26.2* 24.2* 25.3* 26.0*  MCV 88.9  --  90.7 90.3 91.0 90.0  PLT 330  --  360 320 264 289   Basic Metabolic Panel: Recent Labs  Lab 05/23/23 0625 05/24/23 0623 05/25/23 0611 05/26/23 0429 05/27/23 0435  NA 139 138 136 130* 137  K 4.0 3.7 3.7 3.8 4.5  CL 107 111 109 100 107  CO2 20* 18* 18* 20* 21*  GLUCOSE 268* 191* 87 316* 284*  BUN 23* 19 16 22* 22*  CREATININE 2.44* 2.18* 1.89* 2.12* 2.21*  CALCIUM 8.1* 8.1* 8.2* 7.7* 8.2*     Recent Results (from the past 240 hours)  Culture, blood (Routine X 2) w Reflex to ID Panel     Status: None   Collection Time: 05/19/23 12:17 PM   Specimen: BLOOD  Result Value Ref Range Status   Specimen Description BLOOD BLOOD RIGHT ARM  Final   Special Requests   Final    BOTTLES DRAWN AEROBIC ONLY Blood Culture results may not be optimal due to an inadequate volume of blood received in culture bottles   Culture   Final    NO GROWTH 5 DAYS Performed at [facility], [address], [place], [place] [address]    Report Status 05/24/2023 FINAL  Final  Culture, blood (Routine X 2) w Reflex to ID Panel     Status: None   Collection Time: 05/19/23 12:21 PM   Specimen: BLOOD  Result Value Ref Range Status   Specimen Description BLOOD BLOOD RIGHT ARM  Final   Special Requests   Final    BOTTLES DRAWN AEROBIC AND ANAEROBIC Blood Culture adequate volume   Culture   Final    NO GROWTH 5 DAYS Performed at  [facility], [address] [place], [place] [address]    Report Status 05/24/2023 FINAL  Final  Surgical PCR screen     Status: None   Collection Time: 05/24/23  9:22 PM   Specimen: Nasal Mucosa; Nasal Swab  Result Value Ref Range Status   MRSA, PCR NEGATIVE NEGATIVE Final  Staphylococcus aureus NEGATIVE NEGATIVE Final    Comment: (NOTE) The Xpert SA Assay (FDA approved for NASAL specimens in patients 84 years of age and older), is one component of a comprehensive surveillance program. It is not intended to diagnose infection nor to guide or monitor treatment. Performed at [facility], [address] [place], [place] [address]   Aerobic/Anaerobic Culture w Gram Stain (surgical/deep wound)     Status: None (Preliminary result)   Collection Time: 05/25/23 10:15 AM   Specimen: PATH Bone biopsy; Tissue  Result Value Ref Range Status   Specimen Description TISSUE  Final   Special Requests RIGHT DISTAL PHALANX  Final   Gram Stain NO WBC SEEN NO ORGANISMS SEEN   Final   Culture   Final    NO GROWTH 2 DAYS Performed at [facility], [address] [place], [place] [address]    Report Status PENDING  Incomplete     Radiology Studies: MR BRAIN WO CONTRAST Result Date: 05/27/2023 CLINICAL DATA:  Stroke, follow-up EXAM: MRI HEAD WITHOUT CONTRAST TECHNIQUE: Multiplanar, multiecho pulse sequences of the brain and surrounding structures were obtained without intravenous contrast. COMPARISON:  05/26/2023 CT head, 03/05/2023 MRI head 10 FINDINGS: Evaluation is somewhat limited by motion artifact. Brain: No restricted diffusion with ADC correlate to suggest acute or subacute infarct. An area of increased signal on diffusion-weighted imaging (series 2, image 31) correlates with a focus of increased signal on ADC map (series 250, image 31) and increased T2 signal; this area of lacunar infarct, in the left temporoparietal white matter, is new from the prior exam, likely a late  subacute infarct. No acute hemorrhage, mass, mass effect, or midline shift. No hydrocephalus or extra-axial collection. Pituitary and craniocervical junction within normal limits. Numerous remote infarcts, primarily in the right cerebral hemisphere and in the right MCA distribution, with additional smaller infarct in the left occipital lobe. Ex vacuo dilatation of the right lateral ventricle low layering degeneration extending into the right midbrain and pons. Vascular: Abnormal right ICA flow void, which likely reflects the known stent, the patency of which cannot be assessed. Otherwise normal arterial flow voids. Skull and upper cervical spine: Normal marrow signal. Sinuses/Orbits: Clear paranasal sinuses. No acute finding in the orbits. Other: The mastoid air cells are well aerated. IMPRESSION: 1. Late subacute lacunar infarct in the left temporoparietal white matter, new from the prior exam. 2. No acute intracranial process. No evidence of acute infarct. 3. Numerous remote infarcts, primarily in the right cerebral hemisphere and in the right MCA distribution, with additional smaller infarct in the left occipital lobe. Electronically Signed   By: Wiliam Ke M.D.   On: 05/27/2023 11:34   EEG adult Result Date: 05/27/2023 Charlsie Quest, MD     05/27/2023  8:37 AM Patient Name: Lauren [name] MRN: [id number] Epilepsy Attending: Charlsie Quest Referring Physician/Provider: Gordy Councilman, MD Date: 05/26/2023 Duration: 28.13 mins Patient history: 43yo F with h/o seizures getting eeg to evaluate for seizure. Level of alertness: Awake,asleep AEDs during EEG study: TPM Technical aspects: This EEG study was done with scalp electrodes positioned according to the 10-20 International system of electrode placement. Electrical activity was reviewed with band pass filter of 1-70Hz , sensitivity of 7 uV/mm, display speed of 21mm/sec with a 60Hz  notched filter applied as appropriate. EEG data were recorded  continuously and digitally stored.  Video monitoring was available and reviewed as appropriate. Description: The posterior dominant rhythm consists of 8 Hz activity of  moderate voltage (25-35 uV) seen predominantly in posterior head regions, symmetric and reactive to eye opening and eye closing. Sleep was characterized by vertex waves, sleep spindles (12 to 14 Hz), maximal frontocentral region. EEG showed continuous 3 to 6 Hz theta-delta slowing in right hemisphere. Hyperventilation and photic stimulation were not performed.   ABNORMALITY - Continuous slow, right hemisphere IMPRESSION: This study is suggestive of cortical dysfunction arising from right hemisphere likely secondary to underlying encephalomalacia. No seizures or epileptiform discharges were seen throughout the recording. Priyanka O Yadav   VAS [name] LOWER EXTREMITY SAPHENOUS VEIN MAPPING Result Date: 05/26/2023 LOWER EXTREMITY VEIN MAPPING Patient Name:  Lauren [name]  Date of Exam:   05/26/2023 Medical Rec #: [id number]        Accession #:    [id number] Date of Birth: 10-12-1980        Patient Gender: F Patient Age:   72 years Exam Location:  [facility] Procedure:      VAS [name] LOWER EXTREMITY SAPHENOUS VEIN MAPPING Referring Phys: Sherald Hess --------------------------------------------------------------------------------  Indications: Pre-op History:     History of PAD; patient is pre-operative for lower extremity bypass              graft.  Limitations: body habitus, movement Comparison Study: No previous exams Performing Technologist: Jody Hill RVT, RDMS  Examination Guidelines: A complete evaluation includes B-mode imaging, spectral Doppler, color Doppler, and power Doppler as needed of all accessible portions of each vessel. Bilateral testing is considered an integral part of a complete examination. Limited examinations for reoccurring indications may be performed as noted.  +---------------+-----------+----------------------+---------------+-----------+   RT Diameter  RT Findings         GSV            LT Diameter  LT Findings      (cm)                                            (cm)                  +---------------+-----------+----------------------+---------------+-----------+      0.26                     Saphenofemoral         0.35                                                   Junction                                  +---------------+-----------+----------------------+---------------+-----------+      0.19                     Proximal thigh         0.26                  +---------------+-----------+----------------------+---------------+-----------+      0.27                       Mid thigh            0.14                  +---------------+-----------+----------------------+---------------+-----------+  0.18                      Distal thigh          0.15                  +---------------+-----------+----------------------+---------------+-----------+      0.23                          Knee              0.14                  +---------------+-----------+----------------------+---------------+-----------+      0.15                       Prox calf            0.13                  +---------------+-----------+----------------------+---------------+-----------+      0.15                        Mid calf            0.09                  +---------------+-----------+----------------------+---------------+-----------+      0.15                      Distal calf           0.15                  +---------------+-----------+----------------------+---------------+-----------+      0.13                         Ankle              0.12                  +---------------+-----------+----------------------+---------------+-----------+    Preliminary    PERIPHERAL VASCULAR CATHETERIZATION Result Date:  05/26/2023 Images from the original result were not included.   Patient name: Lauren [name] MRN: [id number]        DOB: 1980-09-29          Sex: female  05/26/2023 Pre-operative Diagnosis: Critical limb ischemia right lower extremity with tissue loss Post-operative diagnosis:  Same Surgeon:  Cephus Shelling, MD Procedure Performed: 1.  Ultrasound-guided access left common femoral artery 2.  CO2 aortogram with catheter selection of aorta 3.  Right lower extremity arteriogram with catheter selection of the above-knee popliteal artery 4.  46 minutes of monitored moderate conscious sedation time  Indications: 43 year old female admitted with multiple issues including right foot wound with tissue loss.  She presents today for lower extremity arteriogram after risk benefits discussed.  Contrast: 15 mL, otherwise CO2 for case  Findings:  US guided access left common femoral artery.  CO2 aortogram was performed given stage IV CKD that showed a patent infrarenal aorta with patent iliacs without flow-limiting stenosis.   On the right she had a patent common femoral and profunda.  The SFA is patent and she has some disease and particularly in the distal SFA without any flow-limiting stenosis.  The right above knee popliteal artery is occluded.  We had to use some limited contrast for a total of 15 mL given a right above-knee  popliteal occlusion and we could not evaluate distal runoff with CO2.  She reconstitutes an anterior tibial and peroneal.  Dominant runoff in the foot is the anterior tibial widely patent into the foot through the dorsalis pedis.  During the procedure patient became unresponsive and desatted into the 60s.  When she awoke noted to have slurred speech with some left facial droop.  Rapid response was called and also code stroke.              Procedure:  The patient was identified in the holding area and taken to room 8.  The patient was then placed supine on the table and prepped and draped in the usual  sterile fashion.  A time out was called.  The patient received Versed and fentanyl for conscious moderate sedation.  Vital signs were monitored including heart rate, respiratory rate, oxygenation and blood pressure.  I was present for all of moderate sedation.  Ultrasound was used to evaluate the left common femoral artery.  It was patent .  A digital ultrasound image was acquired.  A micropuncture needle was used to access the left common femoral artery under ultrasound guidance.  An 018 wire was advanced without resistance and a micropuncture sheath was placed.  The 018 wire was removed and a benson wire was placed.  The micropuncture sheath was exchanged for a 5 french sheath.  An omniflush catheter was advanced over the wire to the level of L-1.  An abdominal angiogram was obtained with CO2.  Next, using the omniflush catheter and a benson wire, the aortic bifurcation was crossed and the catheter was placed into theright external iliac artery and right runoff was obtained.  Ultimately we used CO2 noting a right above-knee popliteal occlusion.  We could not see runoff below the knee.  I then used a straight flush catheter on the right leg.  During this time patient became unresponsive and desatted into the 60s.  We did bag mask ventilation and got her sats up.  When she awoke she had some slurred speech and left-sided weakness that improved.  We did call rapid response and a code stroke.  We were able to get several shots of her lower extremity runoff with very limited contrast to evaluate for distal target.  Wires and catheters were removed.  Sheath was removed from the left groin and pressure held.  Plan: Patient will need evaluation for right femoral to AT bypass.  Vein mapping ordered.  Cephus Shelling, MD [facility] Office: [phone]  Cephus Shelling   CT HEAD CODE STROKE WO CONTRAST Result Date: 05/26/2023 CLINICAL DATA:  Code stroke.  Stroke-like symptoms  EXAM: CT HEAD WITHOUT CONTRAST TECHNIQUE: Contiguous axial images were obtained from the base of the skull through the vertex without intravenous contrast. RADIATION DOSE REDUCTION: This exam was performed according to the departmental dose-optimization program which includes automated exposure control, adjustment of the mA and/or kV according to patient size and/or use of iterative reconstruction technique. COMPARISON:  Head CT 05/17/2023 FINDINGS: Brain: No hemorrhage. No hydrocephalus. No extra-axial fluid collection. No mass effect. No mass lesion. There is a chronic right MCA territory infarct involving the right frontal lobe, the right parietal lobe, and anterolateral right temporal lobe. There is ex vacuo dilatation of the right lateral ventricular system. No mass effect. No mass lesion. Vascular: There is a vascular stent in the petrous segment of the right ICA. Skull: Negative Sinuses/Orbits: No middle ear or mastoid effusion.  Paranasal sinuses are clear orbits unremarkable. Other: Extracranial vascular calcifications, as can be seen in the setting of diabetes mellitus. ASPECTS [facility] Stroke Program Early CT Score): 10 when accounting for chronic findings. IMPRESSION: 1. No hemorrhage or CT evidence of an acute cortical infarct. 2. Chronic right MCA territory infarct. Findings were paged to Dr. Iver Nestle on 05/26/23 at 12:55 PM. Electronically Signed   By: Lorenza Cambridge M.D.   On: 05/26/2023 12:56   [name] EKG SITE RITE Result Date: 05/25/2023 If [facility] image not attached, placement could not be confirmed due to current cardiac rhythm.   Scheduled Meds:  sodium chloride   Intravenous Once   atorvastatin  40 mg Oral Daily   cephALEXin  500 mg Oral Q8H   Chlorhexidine Gluconate Cloth  6 each Topical Daily   clopidogrel  75 mg Oral Daily   doxycycline  100 mg Oral Q12H   ferrous sulfate  325 mg Oral Q breakfast   hydrALAZINE  25 mg Oral BID   insulin aspart  0-9 Units Subcutaneous TID WC   insulin  aspart  5 Units Subcutaneous TID WC   insulin glargine-yfgn  10 Units Subcutaneous BID   isosorbide mononitrate  60 mg Oral Daily   levothyroxine  125 mcg Oral Q0600   metoprolol succinate  50 mg Oral Daily   pantoprazole  40 mg Oral Daily   sodium bicarbonate  650 mg Oral BID   sodium chloride flush  3 mL Intravenous Q12H   topiramate  50 mg Oral BID   Continuous Infusions:  sodium chloride 75 mL/hr at 05/27/23 0303     LOS: 10 days   Burnadette Pop, MD [facility] P1/24/2025, 1:24 PM

## 2023-05-27 NOTE — Progress Notes (Signed)
Patient was ambulating with the staff with assistance, she lost her balance had a witnessed fall on her back, no any injuries noted, Informed to the MD.  Patient was alert and oriented after the fall, she didn't complain of pain on her body, she refused to inform to her family about the fall.  Fall precautions continued, education provided to the patient to use the call bell. All the safety measures implemented to prevent fall.

## 2023-05-27 NOTE — Plan of Care (Addendum)
05/26/2023 EEG --  - Continuous slow, right hemisphere This study is suggestive of cortical dysfunction arising from right hemisphere likely secondary to underlying encephalomalacia. No seizures or epileptiform discharges were seen throughout the recording.   Addendum:  MRI brain personally reviewed, agree with radiology: 1. Late subacute lacunar infarct in the left temporoparietal white matter, new from the prior exam. 2. No acute intracranial process. No evidence of acute infarct. 3. Numerous remote infarcts, primarily in the right cerebral hemisphere and in the right MCA distribution, with additional smaller infarct in the left occipital lobe.  Discussed with Dr. Pearlean Brownie who agrees patient is cleared for planned vascular procedure from stroke perspective -- small but acceptable risk of ICH.   Anticoagulation per primary / vascular teams  Neurology will sign off at this time, please reach out with any additional questions/concerns    Brooke Dare MD-PhD [facility] [phone]

## 2023-05-27 NOTE — Progress Notes (Signed)
Chronology of events At shift change this RN and exiting day shift RN, during bedside report was  told by the patient, she wants to leave AMA. Patient was educated on the [facility] process and consequences. The patient said she was in the process of making calls to family for transportation.  Patient has removed her telemetry since she is planning on leaving. CMD has been made aware and patient was placed on standby.  This RN spoke with patient's daughter Al Decant who is very concerned the patient will be allowed to go AMA. I reassured the daughter that since the patient has not obtained transportation and cannot safely ambulate we would not facilitate AMA.   After an extensive conversation with the patient, this RN was able to help the patient understand her best and safest option would to remain inpatient and discuss with rounding physician's in the morning an option to discharge early and return for her vascular procedures scheduled for next week.  Will continue monitor and encourage the patient to cooperate with her treatment plan.

## 2023-05-27 NOTE — Progress Notes (Addendum)
Physical Therapy Treatment Patient Details Name: Lauren [name] MRN: [id number] DOB: [birth year] Today's Date: 05/27/2023   History of Present Illness Pt is a 43 y.o. F who presents with reports of chest pain and  MRI of the right foot 05/17/2022 suggesting possible acute osteomyelitis. Recently admitted for DKA and acute metabolic encephalopathy, UTI and 03/2023 with seizure. I&D R great toe 1/22. RLE arteriogram 1/23 with hypoxic event and L facial droop, activating code stroke. Symptoms resolved. 1/24 MRI revealed late subacute lacunar infarct in the left temporoparietal white matter, new from the prior exam.  Significant PMH: DM1, seizures, CVA, CKD stage 4, NSTEMI, recent left proximal humerus fx after fall 12/23.    PT Comments  Pt s/p procedures and tests as noted above. Pt is now WBAT RLE in post op shoe. She required min assist transfers, and min/mod assist amb 50'. Unsteady gait with wide BOS. LUE sling and RLE post op shoe in place during mobility. Good sitting balance and poor standing balance. Pt returned to sitting EOB at end of session to finish lunch. Continue to recommend SNF at d/c. If pt declines, rec 24/7 assist, HHPT, and pt to primarily mobilize at w/c level in home.     If plan is discharge home, recommend the following: A little help with walking and/or transfers;Help with stairs or ramp for entrance;A little help with bathing/dressing/bathroom;Assistance with cooking/housework   Can travel by private vehicle     No  Equipment Recommendations  None recommended by PT    Recommendations for Other Services       Precautions / Restrictions Precautions Precautions: Fall Required Braces or Orthoses: Sling;Other Brace Other Brace: post op shoe Restrictions Weight Bearing Restrictions Per Provider Order: Yes LUE Weight Bearing Per Provider Order: Non weight bearing RLE Weight Bearing Per Provider Order: Weight bearing as tolerated (in post op shoe)     Mobility  Bed  Mobility Overal bed mobility: Modified Independent                  Transfers Overall transfer level: Needs assistance Equipment used: None Transfers: Sit to/from Stand Sit to Stand: Min assist           General transfer comment: increased time to stabilize initial balance    Ambulation/Gait Ambulation/Gait assistance: Mod assist, Min assist Gait Distance (Feet): 50 Feet Assistive device:  (furniture walking, handrail) Gait Pattern/deviations: Step-through pattern, Shuffle, Decreased step length - right, Decreased step length - left, Wide base of support Gait velocity: slow, guarded Gait velocity interpretation: <1.8 ft/sec, indicate of risk for recurrent falls   General Gait Details: min assist when RUE supported by handrail/furniture, mod assist without UE support. L sling and R post op shoe in place.   Stairs             Wheelchair Mobility     Tilt Bed    Modified Rankin (Stroke Patients Only)       Balance Overall balance assessment: Needs assistance Sitting-balance support: No upper extremity supported, Feet supported Sitting balance-Leahy Scale: Good Sitting balance - Comments: sitting EOB eating lunch   Standing balance support: No upper extremity supported, Single extremity supported, During functional activity Standing balance-Leahy Scale: Poor                              Cognition Arousal: Alert Behavior During Therapy: Flat affect Overall Cognitive Status: History of cognitive impairments - at baseline  Exercises      General Comments General comments (skin integrity, edema, etc.): VSS on RA. Bleeding noted through dressing R great toe. Pt advised to elevate foot after she finished eating lunch.      Pertinent Vitals/Pain Pain Assessment Pain Assessment: Faces Faces Pain Scale: Hurts a little bit Pain Location: R foot Pain Descriptors / Indicators:  Grimacing, Guarding Pain Intervention(s): Monitored during session, Limited activity within patient's tolerance    Home Living                          Prior Function            PT Goals (current goals can now be found in the care plan section) Acute Rehab PT Goals Patient Stated Goal: independence Progress towards PT goals: Progressing toward goals    Frequency    Min 1X/week      PT Plan      Co-evaluation              AM-PAC PT "6 Clicks" Mobility   Outcome Measure  Help needed turning from your back to your side while in a flat bed without using bedrails?: None Help needed moving from lying on your back to sitting on the side of a flat bed without using bedrails?: None Help needed moving to and from a bed to a chair (including a wheelchair)?: A Little Help needed standing up from a chair using your arms (e.g., wheelchair or bedside chair)?: A Little Help needed to walk in hospital room?: A Lot Help needed climbing 3-5 steps with a railing? : A Lot 6 Click Score: 18    End of Session Equipment Utilized During Treatment: Gait belt;Other (comment) (LUE sling, RLE post op shoe) Activity Tolerance: Patient tolerated treatment well Patient left: in bed;with call bell/phone within reach;with family/visitor present Nurse Communication: Mobility status;Other (comment) (no purewick. Use BSC or amb to bathroom (with staff).) PT Visit Diagnosis: Unsteadiness on feet (R26.81);History of falling (Z91.81)     Time: 1205-1230 PT Time Calculation (min) (ACUTE ONLY): 25 min  Charges:    $Gait Training: 23-37 mins PT General Charges $$ ACUTE PT VISIT: 1 Visit                     Ferd Glassing., PT  Office # [phone]    Ilda Foil 05/27/2023, 12:51 PM

## 2023-05-27 NOTE — Procedures (Signed)
Patient Name: Lauren [name]  MRN: [id number]  Epilepsy Attending: Charlsie Quest  Referring Physician/Provider: Gordy Councilman, MD  Date: 05/26/2023 Duration: 28.13 mins  Patient history: 43yo F with h/o seizures getting eeg to evaluate for seizure.  Level of alertness: Awake,asleep  AEDs during EEG study: TPM  Technical aspects: This EEG study was done with scalp electrodes positioned according to the 10-20 International system of electrode placement. Electrical activity was reviewed with band pass filter of 1-70Hz , sensitivity of 7 uV/mm, display speed of 75mm/sec with a 60Hz  notched filter applied as appropriate. EEG data were recorded continuously and digitally stored.  Video monitoring was available and reviewed as appropriate.  Description: The posterior dominant rhythm consists of 8 Hz activity of moderate voltage (25-35 uV) seen predominantly in posterior head regions, symmetric and reactive to eye opening and eye closing. Sleep was characterized by vertex waves, sleep spindles (12 to 14 Hz), maximal frontocentral region. EEG showed continuous 3 to 6 Hz theta-delta slowing in right hemisphere. Hyperventilation and photic stimulation were not performed.     ABNORMALITY - Continuous slow, right hemisphere  IMPRESSION: This study is suggestive of cortical dysfunction arising from right hemisphere likely secondary to underlying encephalomalacia. No seizures or epileptiform discharges were seen throughout the recording.  Americus Perkey Annabelle Harman

## 2023-05-27 NOTE — Progress Notes (Signed)
PHARMACY - ANTICOAGULATION CONSULT NOTE  Pharmacy Consult for warfarin > heparin Indication: CVA  Allergies  Allergen Reactions   Tape Other (See Comments)    Skin comes off with any adhesive   Visipaque [Iodixanol] Nausea And Vomiting    Patient had vagal response during procedure became hypertensive, and bradycardic. CO2 used during procedure prior to contrast being used, this may be cause as well. Recommended premedicating prior to contrast being used in the future.    Wellbutrin [Bupropion] Hives   Gabapentin     dizzy   Ciprofloxacin Hcl Hives and Rash    Hives/rash at injection site; 01/15/22 tolerated IV cipro    Patient Measurements: Height: 5\' 5"  (165.1 cm) Weight: 95.6 kg (210 lb 12.2 oz) IBW/kg (Calculated) : 57 Heparin Dosing Weight: 78 kg  Vital Signs: Temp: 98 F (36.7 C) (01/24 2205) Temp Source: Oral (01/24 2205) BP: 125/61 (01/24 2205) Pulse Rate: 91 (01/24 2205)  Labs: Recent Labs    05/25/23 0611 05/26/23 0429 05/27/23 0435  HGB 7.3* 7.8* 8.1*  HCT 24.2* 25.3* 26.0*  PLT 320 264 289  LABPROT 15.9* 15.3* 14.2  INR 1.3* 1.2 1.1  CREATININE 1.89* 2.12* 2.21*    Estimated Creatinine Clearance: 38.7 mL/min (A) (by C-G formula based on SCr of 2.21 mg/dL (H)).   Medical History: Past Medical History:  Diagnosis Date   Acute ischemic stroke (HCC) 01/01/2020   Acute kidney injury superimposed on CKD (HCC) 01/01/2020   Acute right MCA stroke (HCC) 01/28/2022   Anemia    CAD (coronary artery disease)    a. s/p cath in 03/2014 showing 30% mid-LAD, moderate to severe disease along small D1, patent LCx, moderate to severe distal OM2 stenosis and moderate diffuse diease along RCA not amenable to PCI   Cerebrovascular accident (CVA) (HCC) 12/31/2019   CHF (congestive heart failure) (HCC)    a. EF 55-60% in 12/2019 b. EF at 35-40% by echo in 05/2020   CKD (chronic kidney disease) stage 4, GFR 15-29 ml/min (HCC)    Diabetes mellitus without complication  (HCC)    Myocardial infarction (HCC)    Neuropathy    PONV (postoperative nausea and vomiting)    PVD (peripheral vascular disease) (HCC)    Stroke (HCC) 01/2022   L hand weakness   Assessment: Pharmacy consulted to dose warfarin in patient with history of CVA. Home dose listed as 2.5 mg on Tue/Thu/Sat and 5 mg ROW with last dose listed as 1/12.  1/20 INR up to 2.9, mild nosebleed noted 1/19, no further nosebleeds. podiatry would like INR </= 1.5 in order to take for bone biopsy. Will need to hold coumadin and will give small dose of oral vit K 2.5mg    1/21 INR down to 1.9, okayed with Dr. Renford Dills to give another oral dose of vit K 1mg    1/24 INR 1.1 - s/p foot I&D and biopsy - warfarin remains on hold for VVS surgery next week Will cover with heparin for interim   Goal of Therapy:  Anti-Xa goal = 0.3 - 0.5 INR 2-3 Monitor platelets by anticoagulation protocol: Yes   Plan:  Hold warfarin Heparin drip 1300 uts/hr Heparin level 6hr after drip started  Daily heparin level and CBC  Monitor s/s bleeding  _____  1/24 PM UPDATE: Lab drawn delayed as patient was trying to leave AMA.  Of note, patient did have witnessed fall earlier this afternoon - the tech was able to help patient to the floor and no trauma noted  per RN.  No bleeding/bruising or infusion issues noted.   -Initial anti-Xa level = 0.17, subtherapeutic -Increase heparin to 1350 units/hr -F/u AM level (~6h)  Trixie Rude, PharmD Clinical Pharmacist 05/27/2023  10:10 PM

## 2023-05-28 DIAGNOSIS — N184 Chronic kidney disease, stage 4 (severe): Secondary | ICD-10-CM | POA: Diagnosis not present

## 2023-05-28 DIAGNOSIS — N179 Acute kidney failure, unspecified: Secondary | ICD-10-CM | POA: Diagnosis not present

## 2023-05-28 LAB — HEPARIN LEVEL (UNFRACTIONATED): Heparin Unfractionated: 0.18 [IU]/mL — ABNORMAL LOW (ref 0.30–0.70)

## 2023-05-28 LAB — BASIC METABOLIC PANEL
Anion gap: 13 (ref 5–15)
BUN: 22 mg/dL — ABNORMAL HIGH (ref 6–20)
CO2: 20 mmol/L — ABNORMAL LOW (ref 22–32)
Calcium: 8.2 mg/dL — ABNORMAL LOW (ref 8.9–10.3)
Chloride: 107 mmol/L (ref 98–111)
Creatinine, Ser: 2.04 mg/dL — ABNORMAL HIGH (ref 0.44–1.00)
GFR, Estimated: 31 mL/min — ABNORMAL LOW (ref >60)
Glucose, Bld: 148 mg/dL — ABNORMAL HIGH (ref 70–99)
Potassium: 3.8 mmol/L (ref 3.5–5.1)
Sodium: 140 mmol/L (ref 135–145)

## 2023-05-28 LAB — CBC
HCT: 24.2 % — ABNORMAL LOW (ref 36.0–46.0)
Hemoglobin: 7.4 g/dL — ABNORMAL LOW (ref 12.0–15.0)
MCH: 27.4 pg (ref 26.0–34.0)
MCHC: 30.6 g/dL (ref 30.0–36.0)
MCV: 89.6 fL (ref 80.0–100.0)
Platelets: 253 10*3/uL (ref 150–400)
RBC: 2.7 MIL/uL — ABNORMAL LOW (ref 3.87–5.11)
RDW: 17 % — ABNORMAL HIGH (ref 11.5–15.5)
WBC: 8 10*3/uL (ref 4.0–10.5)
nRBC: 0 % (ref 0.0–0.2)

## 2023-05-28 LAB — PROTIME-INR
INR: 1.1 (ref 0.8–1.2)
Prothrombin Time: 14.3 s (ref 11.4–15.2)

## 2023-05-28 LAB — GLUCOSE, CAPILLARY
Glucose-Capillary: 84 mg/dL (ref 70–99)
Glucose-Capillary: 136 mg/dL — ABNORMAL HIGH (ref 70–99)

## 2023-05-28 MED ORDER — CEPHALEXIN 500 MG PO CAPS: 500.0000 mg | ORAL_CAPSULE | Freq: Three times a day (TID) | ORAL | 0 refills | Status: DC

## 2023-05-28 MED ORDER — NICOTINE 21 MG/24HR TD PT24
21.0000 mg | MEDICATED_PATCH | Freq: Every day | TRANSDERMAL | Status: DC
  Administered 2023-05-28: 21 mg via TRANSDERMAL
  Filled 2023-05-28: qty 1

## 2023-05-28 MED ORDER — DOXYCYCLINE HYCLATE 100 MG PO TABS: 100.0000 mg | ORAL_TABLET | Freq: Two times a day (BID) | ORAL | 0 refills | Status: DC

## 2023-05-28 NOTE — Progress Notes (Signed)
PROGRESS NOTE  Lauren [name]  ZOX:[id number] DOB: [birth year] DOA: 05/17/2023 PCP: Billie Lade, MD   Brief Narrative: Patient is a 43 y.o. 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   She had recent admits 04/21/23- 04/24/23 for DKA and UTI and 03/2023 with seizure, DKA, and AKI.  On presentation, she was  afebrile with soft BP, lab showed  WBC normal at 8.2k. AKI with Cr of  2.7 from baseline ~1.9. She had redness to the right foot, XR without significant osseous finding.MRI of the right foot 1/15 suggested acute osteomyelitis for which IV antibiotics were started, blood cultures ordered. Case discussed with the patient's podiatrist who suggested formal evaluation as inpatient for possible debridement, so transferred to [facility] from [facility].  Vascular surgery also consulted as per podiatry  recommendation.  PT recommending SNF on discharge.s/p bone biopsy, excisional debridement of right foot ulcer on 1/22.  Status post right lower extremity diagnostic angiogram by vascular surgery on 1/23.  While on procedure, she became hypoxic and developed stroke symptoms with unresponsiveness, facial droop.  Code stroke called.  MRI showed evidence of late subacute lacunar infarct in the left temporoparietal white matter.  Vascular surgery planning for bypass surgery on Wednesday.  Patient wants to go home today.  I had a long discussion with family.  She does not have much support at home and her family is against coming to home.  If he wants to leave, she has to sign against  medical advice  Assessment & Plan:  Principal  Problem:   Acute kidney injury superimposed on stage 4 chronic kidney disease (HCC) Active Problems:   Essential hypertension   Obesity, Class II, BMI 35-39.9   Acquired hypothyroidism   Hypokalemia   Atypical chest pain   Elevated troponin   GERD (gastroesophageal reflux disease)   Dehydration   Type 2 diabetes mellitus with hypoglycemia (HCC)   Thrombocytosis   Closed fracture of left proximal humerus   Cellulitis of right lower extremity   Osteomyelitis of great toe of right foot (HCC)  Right foot cellulitis/myositis/suspected acute osteomyelitis: Started  on linezolid, ceftriaxone.  Blood cultures have been negative so far.MRI right foot showed cortical erosion and marrow edema consistent with acute osteomyelitis of the left great toe.  S/P right great toe bone biopsy, superficial wound debridement .  As per podiatry note, pathology result is negative for osteomyelitis.  No plan for further surgical intervention.  Changed  antibiotics to oral after discussion with podiatry.  Acute ischemic stroke:While on procedure, she became hypoxic and developed stroke symptoms with unresponsiveness, facial droop.  Code stroke called.  Neurology consulted.  MRI showed evidence of late subacute lacunar infarct in the left temporoparietal white matter.  EEG showed cortical dysfunction arising from the right hemisphere likely from underlying encephalomalacia but no seizure or epileptiform discharge.  Stroke team cleared the patient for planned vascular procedure.  Continue to hold  Coumadin  for planned vascular procedure, putting on IV heparin. She has history of CVA/carotid stenosis.Right hemisphere encephalomalacia noted on CT head with stent in right ICA.  Has chronic left hemiparesis, weakness more on the left upper extremity with chronic  edema  Peripheral artery disease:Status post right lower extremity diagnostic angiogram by vascular surgery on 1/23.  Vascular surgery planning for right femoral to  tibial bypass next week  Type 1 diabetes: On insulin pump at home.  Monitor blood sugars.  Continue current insulin regimen  AKI on CKD stage IV: Baseline creatinine around 1.9.  Presented with AKI.  On sodium bicarb.  Kidney function now at baseline  Acute metabolic encephalopathy: Has short term  memory problem.  Continue delirium precaution, frequent reorientation.  Intermittently confused.  Currently alert oriented.  Normocytic anemia: Given unit of blood transfusion here.  Hemoglobin in the range of 8.  Also given a dose of IV iron. Likely from CKD.  Continue oral iron supplementation  Chronic combined HFrEF, HTN: Echo within the past month showed LVEF 30-35%. Continue metoprolol, hydralazine, isosorbide. Not on ACE/ARB/ARNI due to advanced CKD.  Coronary artery disease: On Plavix, statin, beta-blocker at home.  Continuing to follow-up with cardiology as an outpatient.  No anginal symptoms  Left humeral  fracture: Remains on sling.  Continue pain management.  Dr. Romeo Apple, orthopedics recommended outpatient follow-up in 2 weeks.  Has chronic edema of left upper extremity  Hypothyroidism: Continue Synthyroid  GERD: Continue PPI  Seizure disorder: on Topamax  Recurrent falls/debility: PT/OT recommending SNF on discharge. Has left hemiparesis  from CVA.  TOC following          DVT prophylaxis:SCDs Start: 05/18/23 0158     Code Status: Limited: Do not attempt resuscitation (DNR) -DNR-LIMITED -Do Not Intubate/DNI   Family Communication: Discussed with family(daughter) on phone on 1/25  Patient status:Inpatient  Patient is from :home  Anticipated discharge to:SNF  Estimated DC date: After full workup from  vascular surgery, bed availability at SnF   Consultants: Vascular surgery, podiatry  Procedures: Bone biopsy/debridement of right foot, right lower EXTR angiogram  Antimicrobials:  Anti-infectives (From admission, onward)    Start     Dose/Rate Route Frequency  Ordered Stop   05/27/23 1000  doxycycline (VIBRA-TABS) tablet 100 mg        100 mg Oral Every 12 hours 05/27/23 0820 06/02/23 0959   05/27/23 0915  cephALEXin (KEFLEX) capsule 500 mg        500 mg Oral Every 8 hours 05/27/23 0820 06/02/23 0559   05/20/23 1400  vancomycin (VANCOREADY) IVPB 1250 mg/250 mL  Status:  Discontinued        1,250 mg 166.7 mL/hr over 90 Minutes Intravenous Every 48 hours 05/18/23 1337 05/19/23 1230   05/19/23 2200  linezolid (ZYVOX) IVPB 600 mg  Status:  Discontinued        600 mg 300 mL/hr over 60 Minutes Intravenous Every 12 hours 05/19/23 1230 05/27/23 0820   05/19/23 1400  cefTRIAXone (ROCEPHIN) 2 g in sodium chloride 0.9 % 100 mL IVPB  Status:  Discontinued        2 g 200 mL/hr over 30 Minutes Intravenous Every 24 hours 05/19/23 1230 05/27/23 0820   05/18/23 1430  vancomycin (VANCOREADY) IVPB 2000 mg/400 mL        2,000 mg 200 mL/hr over 120 Minutes Intravenous  Once 05/18/23 1331 05/18/23 2303   05/18/23 1430  ceFEPIme (MAXIPIME) 2 g in sodium chloride 0.9 % 100 mL IVPB  Status:  Discontinued        2 g 200 mL/hr over 30 Minutes Intravenous Every 12 hours 05/18/23 1338 05/19/23 1230       Subjective: Patient seen and examined the bedside today.  She  was comfortably sitting in the chair.  Her daughter was on the phone.  Patient expressed that she wants to go home today.  We had a long discussion about this.  Patient needs to be on anticoagulation but vascular surgery planning for bypass surgery on Wednesday.  She cannot be on Coumadin.  We cannot bridge with Lovenox because of kidney function.  Furthermore, her daughter expressing her concern about coming home and saying that she does not have enough support. I requested the patient to be in the hospital until vascular surgery procedure.  Family wants to talk to Transition of care Objective: Vitals:   05/27/23 2205 05/28/23 0421 05/28/23 0725 05/28/23 0833  BP: 125/61 (!) 152/80  (!) 153/94  Pulse: 91 91  90   Resp: 16 18 18    Temp: 98 F (36.7 C) 97.8 F (36.6 C) (!) 97.3 F (36.3 C)   TempSrc: Oral Oral Oral   SpO2: 100% 100% 100%   Weight:      Height:        Intake/Output Summary (Last 24 hours) at 05/28/2023 1113 Last data filed at 05/27/2023 1800 Gross per 24 hour  Intake 480 ml  Output --  Net 480 ml   Filed Weights   05/18/23 0639 05/21/23 2016 05/25/23 0822  Weight: 94 kg 95.6 kg 95.6 kg    Examination:  General exam: Overall comfortable, not in distress HEENT: PERRL Respiratory system:  no wheezes or crackles  Cardiovascular system: S1 & S2 heard, RRR.  Gastrointestinal system: Abdomen is nondistended, soft and nontender. Central nervous system: Alert and oriented, left hemiparesis Extremities: Left upper extremity edema, sling on the left upper extremity, dressing in the right foot, PICC line in the right upper extremity Skin: scaterred bruises     Data Reviewed: I have personally reviewed following labs and imaging studies  CBC: Recent Labs  Lab 05/24/23 0623 05/25/23 0611 05/26/23 0429 05/27/23 0435 05/28/23 0946  WBC 8.2 8.5 11.8* 10.3 8.0  HGB 7.8* 7.3* 7.8* 8.1* 7.4*  HCT 26.2* 24.2* 25.3* 26.0* 24.2*  MCV 90.7 90.3 91.0 90.0 89.6  PLT 360 320 264 289 253   Basic Metabolic Panel: Recent Labs  Lab 05/24/23 0623 05/25/23 0611 05/26/23 0429 05/27/23 0435 05/28/23 0946  NA 138 136 130* 137 140  K 3.7 3.7 3.8 4.5 3.8  CL 111 109 100 107 107  CO2 18* 18* 20* 21* 20*  GLUCOSE 191* 87 316* 284* 148*  BUN 19 16 22* 22* 22*  CREATININE 2.18* 1.89* 2.12* 2.21* 2.04*  CALCIUM 8.1* 8.2* 7.7* 8.2* 8.2*     Recent Results (from the past 240 hours)  Culture, blood (Routine X 2) w Reflex to ID Panel     Status: None   Collection Time: 05/19/23 12:17 PM   Specimen: BLOOD  Result Value Ref Range Status   Specimen Description BLOOD BLOOD RIGHT ARM  Final   Special Requests   Final    BOTTLES DRAWN AEROBIC ONLY Blood Culture results may not be  optimal due to an inadequate volume of blood received in culture bottles   Culture   Final    NO GROWTH 5 DAYS Performed at [facility], [address] [place], [place] [address]    Report Status 05/24/2023 FINAL  Final  Culture, blood (Routine X 2) w Reflex to ID Panel     Status: None   Collection Time: 05/19/23 12:21 PM   Specimen: BLOOD  Result Value Ref Range Status   Specimen  Description BLOOD BLOOD RIGHT ARM  Final   Special Requests   Final    BOTTLES DRAWN AEROBIC AND ANAEROBIC Blood Culture adequate volume   Culture   Final    NO GROWTH 5 DAYS Performed at [facility], [address], [place], [place] [address]    Report Status 05/24/2023 FINAL  Final  Surgical PCR screen     Status: None   Collection Time: 05/24/23  9:22 PM   Specimen: Nasal Mucosa; Nasal Swab  Result Value Ref Range Status   MRSA, PCR NEGATIVE NEGATIVE Final   Staphylococcus aureus NEGATIVE NEGATIVE Final    Comment: (NOTE) The Xpert SA Assay (FDA approved for NASAL specimens in patients 57 years of age and older), is one component of a comprehensive surveillance program. It is not intended to diagnose infection nor to guide or monitor treatment. Performed at [facility], [address], [place], [place] [address]   Aerobic/Anaerobic Culture w Gram Stain (surgical/deep wound)     Status: None (Preliminary result)   Collection Time: 05/25/23 10:15 AM   Specimen: PATH Bone biopsy; Tissue  Result Value Ref Range Status   Specimen Description TISSUE  Final   Special Requests RIGHT DISTAL PHALANX  Final   Gram Stain NO WBC SEEN NO ORGANISMS SEEN   Final   Culture   Final    NO GROWTH 3 DAYS NO ANAEROBES ISOLATED; CULTURE IN PROGRESS FOR 5 DAYS Performed at [facility], [address] [place], [place] [address]    Report Status PENDING  Incomplete     Radiology Studies: MR BRAIN WO CONTRAST Result Date: 05/27/2023 CLINICAL DATA:  Stroke, follow-up EXAM: MRI HEAD WITHOUT CONTRAST  TECHNIQUE: Multiplanar, multiecho pulse sequences of the brain and surrounding structures were obtained without intravenous contrast. COMPARISON:  05/26/2023 CT head, 03/05/2023 MRI head 10 FINDINGS: Evaluation is somewhat limited by motion artifact. Brain: No restricted diffusion with ADC correlate to suggest acute or subacute infarct. An area of increased signal on diffusion-weighted imaging (series 2, image 31) correlates with a focus of increased signal on ADC map (series 250, image 31) and increased T2 signal; this area of lacunar infarct, in the left temporoparietal white matter, is new from the prior exam, likely a late subacute infarct. No acute hemorrhage, mass, mass effect, or midline shift. No hydrocephalus or extra-axial collection. Pituitary and craniocervical junction within normal limits. Numerous remote infarcts, primarily in the right cerebral hemisphere and in the right MCA distribution, with additional smaller infarct in the left occipital lobe. Ex vacuo dilatation of the right lateral ventricle low layering degeneration extending into the right midbrain and pons. Vascular: Abnormal right ICA flow void, which likely reflects the known stent, the patency of which cannot be assessed. Otherwise normal arterial flow voids. Skull and upper cervical spine: Normal marrow signal. Sinuses/Orbits: Clear paranasal sinuses. No acute finding in the orbits. Other: The mastoid air cells are well aerated. IMPRESSION: 1. Late subacute lacunar infarct in the left temporoparietal white matter, new from the prior exam. 2. No acute intracranial process. No evidence of acute infarct. 3. Numerous remote infarcts, primarily in the right cerebral hemisphere and in the right MCA distribution, with additional smaller infarct in the left occipital lobe. Electronically Signed   By: Wiliam Ke M.D.   On: 05/27/2023 11:34   EEG adult Result Date: 05/27/2023 Charlsie Quest, MD     05/27/2023  8:37 AM Patient Name:  Infantof Villagomez MRN: [id number] Epilepsy Attending: Kristopher Oppenheim  Melynda Ripple Referring Physician/Provider: Gordy Councilman, MD Date: 05/26/2023 Duration: 28.13 mins Patient history: 43yo F with h/o seizures getting eeg to evaluate for seizure. Level of alertness: Awake,asleep AEDs during EEG study: TPM Technical aspects: This EEG study was done with scalp electrodes positioned according to the 10-20 International system of electrode placement. Electrical activity was reviewed with band pass filter of 1-70Hz , sensitivity of 7 uV/mm, display speed of 54mm/sec with a 60Hz  notched filter applied as appropriate. EEG data were recorded continuously and digitally stored.  Video monitoring was available and reviewed as appropriate. Description: The posterior dominant rhythm consists of 8 Hz activity of moderate voltage (25-35 uV) seen predominantly in posterior head regions, symmetric and reactive to eye opening and eye closing. Sleep was characterized by vertex waves, sleep spindles (12 to 14 Hz), maximal frontocentral region. EEG showed continuous 3 to 6 Hz theta-delta slowing in right hemisphere. Hyperventilation and photic stimulation were not performed.   ABNORMALITY - Continuous slow, right hemisphere IMPRESSION: This study is suggestive of cortical dysfunction arising from right hemisphere likely secondary to underlying encephalomalacia. No seizures or epileptiform discharges were seen throughout the recording. Priyanka O Yadav   VAS [name] LOWER EXTREMITY SAPHENOUS VEIN MAPPING Result Date: 05/26/2023 LOWER EXTREMITY VEIN MAPPING Patient Name:  TOBA CLAUDIO  Date of Exam:   05/26/2023 Medical Rec #: [id number]        Accession #:    [id number] Date of Birth: 10-08-1980        Patient Gender: F Patient Age:   89 years Exam Location:  [facility] Procedure:      VAS [name] LOWER EXTREMITY SAPHENOUS VEIN MAPPING Referring Phys: Sherald Hess  --------------------------------------------------------------------------------  Indications: Pre-op History:     History of PAD; patient is pre-operative for lower extremity bypass              graft.  Limitations: body habitus, movement Comparison Study: No previous exams Performing Technologist: Jody Hill RVT, RDMS  Examination Guidelines: A complete evaluation includes B-mode imaging, spectral Doppler, color Doppler, and power Doppler as needed of all accessible portions of each vessel. Bilateral testing is considered an integral part of a complete examination. Limited examinations for reoccurring indications may be performed as noted. +---------------+-----------+----------------------+---------------+-----------+   RT Diameter  RT Findings         GSV            LT Diameter  LT Findings      (cm)                                            (cm)                  +---------------+-----------+----------------------+---------------+-----------+      0.26                     Saphenofemoral         0.35                                                   Junction                                  +---------------+-----------+----------------------+---------------+-----------+  0.19                     Proximal thigh         0.26                  +---------------+-----------+----------------------+---------------+-----------+      0.27                       Mid thigh            0.14                  +---------------+-----------+----------------------+---------------+-----------+      0.18                      Distal thigh          0.15                  +---------------+-----------+----------------------+---------------+-----------+      0.23                          Knee              0.14                  +---------------+-----------+----------------------+---------------+-----------+      0.15                       Prox calf            0.13                   +---------------+-----------+----------------------+---------------+-----------+      0.15                        Mid calf            0.09                  +---------------+-----------+----------------------+---------------+-----------+      0.15                      Distal calf           0.15                  +---------------+-----------+----------------------+---------------+-----------+      0.13                         Ankle              0.12                  +---------------+-----------+----------------------+---------------+-----------+    Preliminary    PERIPHERAL VASCULAR CATHETERIZATION Result Date: 05/26/2023 Images from the original result were not included.   Patient name: Heavyn Yearsley MRN: [id number]        DOB: May 02, 1981          Sex: female  05/26/2023 Pre-operative Diagnosis: Critical limb ischemia right lower extremity with tissue loss Post-operative diagnosis:  Same Surgeon:  Cephus Shelling, MD Procedure Performed: 1.  Ultrasound-guided access left common femoral artery 2.  CO2 aortogram with catheter selection of aorta 3.  Right lower extremity arteriogram with catheter selection of the above-knee popliteal artery 4.  46 minutes of monitored moderate conscious sedation time  Indications: 43 year old female admitted with multiple issues including right foot  wound with tissue loss.  She presents today for lower extremity arteriogram after risk benefits discussed.  Contrast: 15 mL, otherwise CO2 for case  Findings:  US guided access left common femoral artery.  CO2 aortogram was performed given stage IV CKD that showed a patent infrarenal aorta with patent iliacs without flow-limiting stenosis.   On the right she had a patent common femoral and profunda.  The SFA is patent and she has some disease and particularly in the distal SFA without any flow-limiting stenosis.  The right above knee popliteal artery is occluded.  We had to use some limited contrast for a total of 15  mL given a right above-knee popliteal occlusion and we could not evaluate distal runoff with CO2.  She reconstitutes an anterior tibial and peroneal.  Dominant runoff in the foot is the anterior tibial widely patent into the foot through the dorsalis pedis.  During the procedure patient became unresponsive and desatted into the 60s.  When she awoke noted to have slurred speech with some left facial droop.  Rapid response was called and also code stroke.              Procedure:  The patient was identified in the holding area and taken to room 8.  The patient was then placed supine on the table and prepped and draped in the usual sterile fashion.  A time out was called.  The patient received Versed and fentanyl for conscious moderate sedation.  Vital signs were monitored including heart rate, respiratory rate, oxygenation and blood pressure.  I was present for all of moderate sedation.  Ultrasound was used to evaluate the left common femoral artery.  It was patent .  A digital ultrasound image was acquired.  A micropuncture needle was used to access the left common femoral artery under ultrasound guidance.  An 018 wire was advanced without resistance and a micropuncture sheath was placed.  The 018 wire was removed and a benson wire was placed.  The micropuncture sheath was exchanged for a 5 french sheath.  An omniflush catheter was advanced over the wire to the level of L-1.  An abdominal angiogram was obtained with CO2.  Next, using the omniflush catheter and a benson wire, the aortic bifurcation was crossed and the catheter was placed into theright external iliac artery and right runoff was obtained.  Ultimately we used CO2 noting a right above-knee popliteal occlusion.  We could not see runoff below the knee.  I then used a straight flush catheter on the right leg.  During this time patient became unresponsive and desatted into the 60s.  We did bag mask ventilation and got her sats up.  When she awoke she had some  slurred speech and left-sided weakness that improved.  We did call rapid response and a code stroke.  We were able to get several shots of her lower extremity runoff with very limited contrast to evaluate for distal target.  Wires and catheters were removed.  Sheath was removed from the left groin and pressure held.  Plan: Patient will need evaluation for right femoral to AT bypass.  Vein mapping ordered.  Cephus Shelling, MD [facility] Office: [phone]  Cephus Shelling   CT HEAD CODE STROKE WO CONTRAST Result Date: 05/26/2023 CLINICAL DATA:  Code stroke.  Stroke-like symptoms EXAM: CT HEAD WITHOUT CONTRAST TECHNIQUE: Contiguous axial images were obtained from the base of the skull through the vertex without intravenous contrast. RADIATION DOSE  REDUCTION: This exam was performed according to the departmental dose-optimization program which includes automated exposure control, adjustment of the mA and/or kV according to patient size and/or use of iterative reconstruction technique. COMPARISON:  Head CT 05/17/2023 FINDINGS: Brain: No hemorrhage. No hydrocephalus. No extra-axial fluid collection. No mass effect. No mass lesion. There is a chronic right MCA territory infarct involving the right frontal lobe, the right parietal lobe, and anterolateral right temporal lobe. There is ex vacuo dilatation of the right lateral ventricular system. No mass effect. No mass lesion. Vascular: There is a vascular stent in the petrous segment of the right ICA. Skull: Negative Sinuses/Orbits: No middle ear or mastoid effusion. Paranasal sinuses are clear orbits unremarkable. Other: Extracranial vascular calcifications, as can be seen in the setting of diabetes mellitus. ASPECTS [facility] Stroke Program Early CT Score): 10 when accounting for chronic findings. IMPRESSION: 1. No hemorrhage or CT evidence of an acute cortical infarct. 2. Chronic right MCA territory infarct. Findings were  paged to Dr. Iver Nestle on 05/26/23 at 12:55 PM. Electronically Signed   By: Lorenza Cambridge M.D.   On: 05/26/2023 12:56    Scheduled Meds:  atorvastatin  40 mg Oral Daily   cephALEXin  500 mg Oral Q8H   Chlorhexidine Gluconate Cloth  6 each Topical Daily   clopidogrel  75 mg Oral Daily   doxycycline  100 mg Oral Q12H   ferrous sulfate  325 mg Oral Q breakfast   hydrALAZINE  25 mg Oral BID   insulin aspart  0-9 Units Subcutaneous TID WC   insulin aspart  5 Units Subcutaneous TID WC   insulin glargine-yfgn  10 Units Subcutaneous BID   isosorbide mononitrate  60 mg Oral Daily   levothyroxine  125 mcg Oral Q0600   metoprolol succinate  50 mg Oral Daily   nicotine  21 mg Transdermal Daily   pantoprazole  40 mg Oral Daily   sodium bicarbonate  650 mg Oral BID   sodium chloride flush  3 mL Intravenous Q12H   topiramate  50 mg Oral BID   Continuous Infusions:  sodium chloride 75 mL/hr at 05/27/23 1747   heparin 1,350 Units/hr (05/28/23 0614)     LOS: 11 days   Burnadette Pop, MD [facility] P1/25/2025, 11:13 AM

## 2023-05-28 NOTE — Progress Notes (Addendum)
  Progress Note    05/28/2023 8:25 AM 2 Days Post-Op  Subjective:  wants to go home; no further stroke like symptoms   Vitals:   05/28/23 0421 05/28/23 0725  BP: (!) 152/80   Pulse: 91 90  Resp: 18 18  Temp: 97.8 F (36.6 C) (!) 97.3 F (36.3 C)  SpO2: 100% 100%   Physical Exam: Lungs:  non labored Incisions:  L groin soft without hematoma Extremities:  dressing left in place R foot Neurologic: A&O  CBC    Component Value Date/Time   WBC 10.3 05/27/2023 0435   RBC 2.89 (L) 05/27/2023 0435   HGB 8.1 (L) 05/27/2023 0435   HGB 12.3 02/18/2023 1032   HCT 26.0 (L) 05/27/2023 0435   HCT 38.4 02/18/2023 1032   PLT 289 05/27/2023 0435   PLT 347 02/18/2023 1032   MCV 90.0 05/27/2023 0435   MCV 93 02/18/2023 1032   MCH 28.0 05/27/2023 0435   MCHC 31.2 05/27/2023 0435   RDW 17.1 (H) 05/27/2023 0435   RDW 14.0 02/18/2023 1032   LYMPHSABS 1.9 05/17/2023 1745   LYMPHSABS 1.9 08/11/2022 1510   MONOABS 0.7 05/17/2023 1745   EOSABS 0.2 05/17/2023 1745   EOSABS 0.1 08/11/2022 1510   BASOSABS 0.0 05/17/2023 1745   BASOSABS 0.1 08/11/2022 1510    BMET    Component Value Date/Time   NA 137 05/27/2023 0435   NA 140 08/11/2022 1511   K 4.5 05/27/2023 0435   CL 107 05/27/2023 0435   CO2 21 (L) 05/27/2023 0435   GLUCOSE 284 (H) 05/27/2023 0435   BUN 22 (H) 05/27/2023 0435   BUN 37 (H) 08/11/2022 1511   CREATININE 2.21 (H) 05/27/2023 0435   CALCIUM 8.2 (L) 05/27/2023 0435   GFRNONAA 28 (L) 05/27/2023 0435   GFRAA 31 (L) 02/01/2020 1328    INR    Component Value Date/Time   INR 1.1 05/27/2023 0435   INR 3.4 09/30/2022 0911     Intake/Output Summary (Last 24 hours) at 05/28/2023 0825 Last data filed at 05/27/2023 1800 Gross per 24 hour  Intake 483 ml  Output --  Net 483 ml     Assessment/Plan:  43 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 Chestine Spore.  This  will likely be with PTFE based on vein mapping Patient is adamant about going home prior to surgery on Wednesday.  If lovenox is not an option based on GFR then she will just need to hold her coumadin in preparation for surgery   Emilie Rutter, PA-C [facility] [phone] 05/28/2023 8:25 AM   VASCULAR STAFF ADDENDUM: I have independently interviewed and examined the patient. I agree with the above.  She is adamant about going home does not want to stay inpatient until surgery on Wednesday.  I explained that it is a different insurance authorization and it is possible that if she goes home surgery may be delayed past Wednesday.  She expressed understanding and still adamant about discharge. Please continue to hold Coumadin, bridging per primary team. Will get in touch with our office on Monday morning about her surgery and new outpatient status.  Daria Pastures MD [facility] Phone Number: [phone] 05/28/2023 9:25 AM

## 2023-05-28 NOTE — Progress Notes (Signed)
PICC line was removed per order and with no complications.  Pt supine and suspended inspiration during removal with instructions to remain supine for 30 minutes after removal.  Pressure held to achieve hemostasis.  Vaseline/gauze/tegaderm applied.  Patient education provided regarding lifting restrictions, site care, and signs of infection.  Pt verbalized understanding of risks however refused to stay in bed and is currently ambulating in the room.  Primary RN is aware.

## 2023-05-28 NOTE — Progress Notes (Addendum)
NSG 0700- Current Change of shift report done at pt bedside, pt adamant about leaving AMA.  Explained the risks of leaving, including death.  Pt A+Ox4, understands the risks reviewed.  Pt agreeable to stay until MD rounds.  Hospitalist rounded, pt again wanting to leave AMA, MD reviewed risks and that it is not safe to be discharged or leave AMA at this time.  MD and this RN spoke with pt's family (husband Chanetta Marshall and daughter Fara Boros while on speaker phone in pt's room). Family also voices concerns and does not want pt to leave AMA.  Pt's mother's SO, Hank, arrived to the unit to pick up the pt.  This RN again encouraged pt to stay and reviewed all risks of leaving.  Pt continues to be adamant about leaving.  Dr. Renford Dills notified, order rcd to DC PICC line.  Pt signed AMA paper. IV heparin and IVF stopped.   IV team removed PICC, pt will not lie flat or wait the 30 minutes post PICC removal.  She is ambulating in the room immediately after IV line removed.  Pt understands the risk of bleeding from the site and possible air emboli.  Pt instructed to leave dressing in place for 24 hours.    Securechat sent to vascular, notified of pt leaving AMA.  Dr. Renford Dills said RX were sent to pt's pharmacy.    Reviewed risks of leaving AMA with pt again, instructed her to call her PCP and vascular for follow up.  Original plan was for surgery next Wednesday.  Pt verbalizes understanding of risks and follow up care.  Visitor Hank aware that he will have to be the one to take the pt off the unit and to the car.  He verbalizes understanding of risks and is agreeable to take the patient home.  Charge RN also made aware of above.    Pt states she has all of her belongings, packed up by herself.    Pt left AMA via wheelchair at this time without incident.

## 2023-05-28 NOTE — Progress Notes (Signed)
PHARMACY - ANTICOAGULATION CONSULT NOTE  Pharmacy Consult for heparin Indication: CVA  Allergies  Allergen Reactions   Tape Other (See Comments)    Skin comes off with any adhesive   Visipaque [Iodixanol] Nausea And Vomiting    Patient had vagal response during procedure became hypertensive, and bradycardic. CO2 used during procedure prior to contrast being used, this may be cause as well. Recommended premedicating prior to contrast being used in the future.    Wellbutrin [Bupropion] Hives   Gabapentin     dizzy   Ciprofloxacin Hcl Hives and Rash    Hives/rash at injection site; 01/15/22 tolerated IV cipro    Patient Measurements: Height: 5\' 5"  (165.1 cm) Weight: 95.6 kg (210 lb 12.2 oz) IBW/kg (Calculated) : 57 Heparin Dosing Weight: 78 kg  Vital Signs: Temp: 97.3 F (36.3 C) (01/25 0725) Temp Source: Oral (01/25 0725) BP: 153/94 (01/25 0833) Pulse Rate: 90 (01/25 0725)  Labs: Recent Labs    05/26/23 0429 05/27/23 0435 05/27/23 2200 05/28/23 0946 05/28/23 0947  HGB 7.8* 8.1*  --  7.4*  --   HCT 25.3* 26.0*  --  24.2*  --   PLT 264 289  --  253  --   LABPROT 15.3* 14.2  --  14.3  --   INR 1.2 1.1  --  1.1  --   HEPARINUNFRC  --   --  0.17*  --  0.18*  CREATININE 2.12* 2.21*  --  2.04*  --     Estimated Creatinine Clearance: 41.9 mL/min (A) (by C-G formula based on SCr of 2.04 mg/dL (H)).  Assessment: Pharmacy consulted to dose warfarin in patient with history of CVA. Home dose listed as 2.5 mg on Tue/Thu/Sat and 5 mg ROW with last dose listed as 1/12.  1/20 INR up to 2.9, mild nosebleed noted 1/19, no further nosebleeds. podiatry would like INR </= 1.5 in order to take for bone biopsy. Will need to hold coumadin and will give small dose of oral vit K 2.5mg    1/21 INR down to 1.9, okayed with Dr. Renford Dills to give another oral dose of vit K 1mg    1/24 INR 1.1 - s/p foot I&D and biopsy - warfarin remains on hold for [facility] surgery next week Will cover with heparin  for interim   Heparin level is subtherapeutic at 0.18 on 1350 units/hr. No bleeding noted or infusion issues per RN, Hgb low 7-8s, platelets are normal.   Goal of Therapy:  Heparin level 0.3-0.5 units/ml Monitor platelets by anticoagulation protocol: Yes   Plan:  Heparin drip 1300 uts/hr 6h heparin level Daily heparin level and CBC  Monitor s/s bleeding  F/u ability to resume warfarin post-op  Thank you for involving pharmacy in this patient's care.  Loura Back, PharmD, BCPS Clinical Pharmacist Clinical phone for 05/28/2023 is [phone] 05/28/2023 10:36 AM

## 2023-05-28 NOTE — Discharge Summary (Addendum)
Physician Discharge Summary  Lauren [name] HYQ:[id number] DOB: Sep 11, 1980 DOA: 05/17/2023  PCP: Billie Lade, MD  Admit date: 05/17/2023 Discharge date: 05/28/2023  Admitted From: Home Disposition:  Signed AMA   Brief/Interim Summary:  Please see my today's progress note for detail Abx sent to her pharmacy       Discharge Diagnoses:  Principal Problem:   Acute kidney injury superimposed on stage 4 chronic kidney disease (HCC) Active Problems:   Essential hypertension   Obesity, Class II, BMI 35-39.9   Acquired hypothyroidism   Hypokalemia   Atypical chest pain   Elevated troponin   GERD (gastroesophageal reflux disease)   Dehydration   Type 2 diabetes mellitus with hypoglycemia (HCC)   Thrombocytosis   Closed fracture of left proximal humerus   Cellulitis of right lower extremity   Osteomyelitis of great toe of right foot [facility])    Discharge Instructions  Discharge Instructions     Ambulatory referral to Physical Therapy   Complete by: As directed    Iontophoresis - 4 mg/ml of dexamethasone: No   T.E.N.S. Unit Evaluation and Dispense as Indicated: No   Sling   Complete by: As directed         Allergies  Allergen Reactions   Tape Other (See Comments)    Skin comes off with any adhesive   Visipaque [Iodixanol] Nausea And Vomiting    Patient had vagal response during procedure became hypertensive, and bradycardic. CO2 used during procedure prior to contrast being used, this may be cause as well. Recommended premedicating prior to contrast being used in the future.    Wellbutrin [Bupropion] Hives   Gabapentin     dizzy   Ciprofloxacin Hcl Hives and Rash    Hives/rash at injection site; 01/15/22 tolerated IV cipro    Consultations:    Procedures/Studies: MR BRAIN WO CONTRAST Result Date: 05/27/2023 CLINICAL DATA:  Stroke, follow-up EXAM: MRI HEAD WITHOUT CONTRAST TECHNIQUE: Multiplanar, multiecho pulse sequences of the brain and surrounding  structures were obtained without intravenous contrast. COMPARISON:  05/26/2023 CT head, 03/05/2023 MRI head 10 FINDINGS: Evaluation is somewhat limited by motion artifact. Brain: No restricted diffusion with ADC correlate to suggest acute or subacute infarct. An area of increased signal on diffusion-weighted imaging (series 2, image 31) correlates with a focus of increased signal on ADC map (series 250, image 31) and increased T2 signal; this area of lacunar infarct, in the left temporoparietal white matter, is new from the prior exam, likely a late subacute infarct. No acute hemorrhage, mass, mass effect, or midline shift. No hydrocephalus or extra-axial collection. Pituitary and craniocervical junction within normal limits. Numerous remote infarcts, primarily in the right cerebral hemisphere and in the right MCA distribution, with additional smaller infarct in the left occipital lobe. Ex vacuo dilatation of the right lateral ventricle low layering degeneration extending into the right midbrain and pons. Vascular: Abnormal right ICA flow void, which likely reflects the known stent, the patency of which cannot be assessed. Otherwise normal arterial flow voids. Skull and upper cervical spine: Normal marrow signal. Sinuses/Orbits: Clear paranasal sinuses. No acute finding in the orbits. Other: The mastoid air cells are well aerated. IMPRESSION: 1. Late subacute lacunar infarct in the left temporoparietal white matter, new from the prior exam. 2. No acute intracranial process. No evidence of acute infarct. 3. Numerous remote infarcts, primarily in the right cerebral hemisphere and in the right MCA distribution, with additional smaller infarct in the left occipital lobe. Electronically Signed   By: Jill Side  Vasan M.D.   On: 05/27/2023 11:34   EEG adult Result Date: 05/27/2023 Charlsie Quest, MD     05/27/2023  8:37 AM Patient Name: Lauren [name] MRN: [id number] Epilepsy Attending: Charlsie Quest Referring  Physician/Provider: Gordy Councilman, MD Date: 05/26/2023 Duration: 28.13 mins Patient history: 43yo F with h/o seizures getting eeg to evaluate for seizure. Level of alertness: Awake,asleep AEDs during EEG study: TPM Technical aspects: This EEG study was done with scalp electrodes positioned according to the 10-20 International system of electrode placement. Electrical activity was reviewed with band pass filter of 1-70Hz , sensitivity of 7 uV/mm, display speed of 26mm/sec with a 60Hz  notched filter applied as appropriate. EEG data were recorded continuously and digitally stored.  Video monitoring was available and reviewed as appropriate. Description: The posterior dominant rhythm consists of 8 Hz activity of moderate voltage (25-35 uV) seen predominantly in posterior head regions, symmetric and reactive to eye opening and eye closing. Sleep was characterized by vertex waves, sleep spindles (12 to 14 Hz), maximal frontocentral region. EEG showed continuous 3 to 6 Hz theta-delta slowing in right hemisphere. Hyperventilation and photic stimulation were not performed.   ABNORMALITY - Continuous slow, right hemisphere IMPRESSION: This study is suggestive of cortical dysfunction arising from right hemisphere likely secondary to underlying encephalomalacia. No seizures or epileptiform discharges were seen throughout the recording. Priyanka O Yadav   VAS [name] LOWER EXTREMITY SAPHENOUS VEIN MAPPING Result Date: 05/26/2023 LOWER EXTREMITY VEIN MAPPING Patient Name:  Lauren [name]  Date of Exam:   05/26/2023 Medical Rec #: [id number]        Accession #:    [id number] Date of Birth: January 30, 1981        Patient Gender: F Patient Age:   41 years Exam Location:  [facility] Procedure:      VAS [name] LOWER EXTREMITY SAPHENOUS VEIN MAPPING Referring Phys: Sherald Hess --------------------------------------------------------------------------------  Indications: Pre-op History:     History of PAD; patient is pre-operative  for lower extremity bypass              graft.  Limitations: body habitus, movement Comparison Study: No previous exams Performing Technologist: Jody Hill RVT, RDMS  Examination Guidelines: A complete evaluation includes B-mode imaging, spectral Doppler, color Doppler, and power Doppler as needed of all accessible portions of each vessel. Bilateral testing is considered an integral part of a complete examination. Limited examinations for reoccurring indications may be performed as noted. +---------------+-----------+----------------------+---------------+-----------+   RT Diameter  RT Findings         GSV            LT Diameter  LT Findings      (cm)                                            (cm)                  +---------------+-----------+----------------------+---------------+-----------+      0.26                     Saphenofemoral         0.35  Junction                                  +---------------+-----------+----------------------+---------------+-----------+      0.19                     Proximal thigh         0.26                  +---------------+-----------+----------------------+---------------+-----------+      0.27                       Mid thigh            0.14                  +---------------+-----------+----------------------+---------------+-----------+      0.18                      Distal thigh          0.15                  +---------------+-----------+----------------------+---------------+-----------+      0.23                          Knee              0.14                  +---------------+-----------+----------------------+---------------+-----------+      0.15                       Prox calf            0.13                  +---------------+-----------+----------------------+---------------+-----------+      0.15                        Mid calf            0.09                   +---------------+-----------+----------------------+---------------+-----------+      0.15                      Distal calf           0.15                  +---------------+-----------+----------------------+---------------+-----------+      0.13                         Ankle              0.12                  +---------------+-----------+----------------------+---------------+-----------+    Preliminary    PERIPHERAL VASCULAR CATHETERIZATION Result Date: 05/26/2023 Images from the original result were not included.   Patient name: Lauren [name] MRN: [id number]        DOB: 1980-12-09          Sex: female  05/26/2023 Pre-operative Diagnosis: Critical limb ischemia right lower extremity with tissue loss Post-operative diagnosis:  Same Surgeon:  Cephus Shelling, MD Procedure Performed: 1.  Ultrasound-guided access left common femoral artery 2.  CO2 aortogram  with catheter selection of aorta 3.  Right lower extremity arteriogram with catheter selection of the above-knee popliteal artery 4.  46 minutes of monitored moderate conscious sedation time  Indications: 43 year old female admitted with multiple issues including right foot wound with tissue loss.  She presents today for lower extremity arteriogram after risk benefits discussed.  Contrast: 15 mL, otherwise CO2 for case  Findings:  US guided access left common femoral artery.  CO2 aortogram was performed given stage IV CKD that showed a patent infrarenal aorta with patent iliacs without flow-limiting stenosis.   On the right she had a patent common femoral and profunda.  The SFA is patent and she has some disease and particularly in the distal SFA without any flow-limiting stenosis.  The right above knee popliteal artery is occluded.  We had to use some limited contrast for a total of 15 mL given a right above-knee popliteal occlusion and we could not evaluate distal runoff with CO2.  She reconstitutes an anterior tibial and peroneal.  Dominant  runoff in the foot is the anterior tibial widely patent into the foot through the dorsalis pedis.  During the procedure patient became unresponsive and desatted into the 60s.  When she awoke noted to have slurred speech with some left facial droop.  Rapid response was called and also code stroke.              Procedure:  The patient was identified in the holding area and taken to room 8.  The patient was then placed supine on the table and prepped and draped in the usual sterile fashion.  A time out was called.  The patient received Versed and fentanyl for conscious moderate sedation.  Vital signs were monitored including heart rate, respiratory rate, oxygenation and blood pressure.  I was present for all of moderate sedation.  Ultrasound was used to evaluate the left common femoral artery.  It was patent .  A digital ultrasound image was acquired.  A micropuncture needle was used to access the left common femoral artery under ultrasound guidance.  An 018 wire was advanced without resistance and a micropuncture sheath was placed.  The 018 wire was removed and a benson wire was placed.  The micropuncture sheath was exchanged for a 5 french sheath.  An omniflush catheter was advanced over the wire to the level of L-1.  An abdominal angiogram was obtained with CO2.  Next, using the omniflush catheter and a benson wire, the aortic bifurcation was crossed and the catheter was placed into theright external iliac artery and right runoff was obtained.  Ultimately we used CO2 noting a right above-knee popliteal occlusion.  We could not see runoff below the knee.  I then used a straight flush catheter on the right leg.  During this time patient became unresponsive and desatted into the 60s.  We did bag mask ventilation and got her sats up.  When she awoke she had some slurred speech and left-sided weakness that improved.  We did call rapid response and a code stroke.  We were able to get several shots of her lower extremity  runoff with very limited contrast to evaluate for distal target.  Wires and catheters were removed.  Sheath was removed from the left groin and pressure held.  Plan: Patient will need evaluation for right femoral to AT bypass.  Vein mapping ordered.  Cephus Shelling, MD [facility] Office: [phone]  Cephus Shelling   CT HEAD  CODE STROKE WO CONTRAST Result Date: 05/26/2023 CLINICAL DATA:  Code stroke.  Stroke-like symptoms EXAM: CT HEAD WITHOUT CONTRAST TECHNIQUE: Contiguous axial images were obtained from the base of the skull through the vertex without intravenous contrast. RADIATION DOSE REDUCTION: This exam was performed according to the departmental dose-optimization program which includes automated exposure control, adjustment of the mA and/or kV according to patient size and/or use of iterative reconstruction technique. COMPARISON:  Head CT 05/17/2023 FINDINGS: Brain: No hemorrhage. No hydrocephalus. No extra-axial fluid collection. No mass effect. No mass lesion. There is a chronic right MCA territory infarct involving the right frontal lobe, the right parietal lobe, and anterolateral right temporal lobe. There is ex vacuo dilatation of the right lateral ventricular system. No mass effect. No mass lesion. Vascular: There is a vascular stent in the petrous segment of the right ICA. Skull: Negative Sinuses/Orbits: No middle ear or mastoid effusion. Paranasal sinuses are clear orbits unremarkable. Other: Extracranial vascular calcifications, as can be seen in the setting of diabetes mellitus. ASPECTS [facility] Stroke Program Early CT Score): 10 when accounting for chronic findings. IMPRESSION: 1. No hemorrhage or CT evidence of an acute cortical infarct. 2. Chronic right MCA territory infarct. Findings were paged to Dr. Iver Nestle on 05/26/23 at 12:55 PM. Electronically Signed   By: Lorenza Cambridge M.D.   On: 05/26/2023 12:56   [name] EKG SITE RITE Result Date:  05/25/2023 If [facility] image not attached, placement could not be confirmed due to current cardiac rhythm.  VAS [name] UPPER EXTREMITY VENOUS DUPLEX Result Date: 05/24/2023 UPPER VENOUS STUDY  Patient Name:  Lauren [name]  Date of Exam:   05/23/2023 Medical Rec #: [id number]        Accession #:    [id number] Date of Birth: 12/26/1980        Patient Gender: F Patient Age:   11 years Exam Location:  [facility] Procedure:      VAS [name] UPPER EXTREMITY VENOUS DUPLEX Referring Phys: Hazeline Junker --------------------------------------------------------------------------------  Indications: Pain, and Left humerus fracture, hemiparesis Limitations: Non compliance, confusion, pain. Comparison Study: Prior left upper extremity venous done 04/24/23 at [facility] Performing Technologist: Sherren Kerns RVS  Examination Guidelines: A complete evaluation includes B-mode imaging, spectral Doppler, color Doppler, and power Doppler as needed of all accessible portions of each vessel. Bilateral testing is considered an integral part of a complete examination. Limited examinations for reoccurring indications may be performed as noted.  Left Findings: +----------+------------+---------+-----------+----------+---------------+ LEFT      CompressiblePhasicitySpontaneousProperties    Summary     +----------+------------+---------+-----------+----------+---------------+ IJV                                                 Not visualized  +----------+------------+---------+-----------+----------+---------------+ Subclavian                                          Not visualized  +----------+------------+---------+-----------+----------+---------------+ Axillary                 Yes       Yes                              +----------+------------+---------+-----------+----------+---------------+ Brachial  Yes       Yes                               +----------+------------+---------+-----------+----------+---------------+ Radial                                              patent by color +----------+------------+---------+-----------+----------+---------------+ Ulnar                                               Not visualized  +----------+------------+---------+-----------+----------+---------------+ Cephalic      Full                                                  +----------+------------+---------+-----------+----------+---------------+ Basilic                                             Not visualized  +----------+------------+---------+-----------+----------+---------------+  Summary:  Left: No evidence of deep vein thrombosis or superficial vein thrombosis in the visualized veins of the upper extremity. Study limited, see tech notes above.  *See table(s) above for measurements and observations.  Diagnosing physician: Carolynn Sayers Electronically signed by Carolynn Sayers on 05/24/2023 at 7:44:23 AM.    Final    VAS [name] ABI WITH/WO TBI Result Date: 05/24/2023  LOWER EXTREMITY DOPPLER STUDY Patient Name:  Lauren [name]  Date of Exam:   05/23/2023 Medical Rec #: [id number]        Accession #:    [id number] Date of Birth: [birth year]        Patient Gender: F Patient Age:   31 years Exam Location:  [facility] Procedure:      VAS [name] ABI WITH/WO TBI Referring Phys: Lelon Mast RHYNE --------------------------------------------------------------------------------  Indications: Peripheral artery disease. High Risk Factors: Hypertension, hyperlipidemia, Diabetes, past history of                    smoking, coronary artery disease. Other Factors: History of stroke with residual left side hemiparesis.  Vascular Interventions: Left anterior tibial, dorsalis pedis, and mid SFA                         angioplasty with stent to mid SFA 06/10/22. Right ICA                         stent. Right brachial, radial, and ulnar artery                          thrombectomy 11/18/21. 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 Limitations: Today's exam was limited due to involuntary patient movement,              edema, patient positioning and patient intolerant to cuff pressure. Comparison Study: Prior ABI done 11/30/22 Performing Technologist: Sherren Kerns RVS  Examination Guidelines: A complete evaluation includes at minimum, Doppler waveform signals and systolic blood pressure  reading at the level of bilateral brachial, anterior tibial, and posterior tibial arteries, when vessel segments are accessible. Bilateral testing is considered an integral part of a complete examination. Photoelectric Plethysmograph (PPG) waveforms and toe systolic pressure readings are included as required and additional duplex testing as needed. Limited examinations for reoccurring indications may be performed as noted.  ABI Findings: +---------+------------------+-----+--------+--------+ Right    Rt Pressure (mmHg)IndexWaveformComment  +---------+------------------+-----+--------+--------+ Brachial 153                    biphasic         +---------+------------------+-----+--------+--------+ PTA      254               1.66 biphasic         +---------+------------------+-----+--------+--------+ DP       254               1.66 biphasic         +---------+------------------+-----+--------+--------+ Great Toe78                0.51 Abnormal         +---------+------------------+-----+--------+--------+ +---------+------------------+-----+---------+----------+ Left     Lt Pressure (mmHg)IndexWaveform Comment    +---------+------------------+-----+---------+----------+ Brachial                                 restricted +---------+------------------+-----+---------+----------+ PTA      89                0.58 biphasic            +---------+------------------+-----+---------+----------+ DP       121               0.79 triphasic            +---------+------------------+-----+---------+----------+ Great Toe80                0.52 Abnormal            +---------+------------------+-----+---------+----------+ +-------+-------------+-----------+----------------+------------+ ABI/TBIToday's ABI  Today's TBIPrevious ABI    Previous TBI +-------+-------------+-----------+----------------+------------+ Right  1.66 non comp0.51       Non compressible0.56         +-------+-------------+-----------+----------------+------------+ Left   0.79         0.52       1.07 monophasic 0.55         +-------+-------------+-----------+----------------+------------+ Arterial wall calcification precludes accurate ankle pressures and ABIs. Right ABIs and bilateral TBIs appear essentially unchanged compared to prior study on 11/30/22. Left ABIs appear decreased compared to prior study on 11/30/22.  Summary: Right: Resting right ankle-brachial index indicates noncompressible right lower extremity arteries. The right toe-brachial index is abnormal. Left: Resting left ankle-brachial index indicates moderate left lower extremity arterial disease. The left toe-brachial index is abnormal. *See table(s) above for measurements and observations.  Electronically signed by Carolynn Sayers on 05/24/2023 at 7:44:12 AM.    Final    VAS [name] LOWER EXTREMITY ARTERIAL DUPLEX Result Date: 05/24/2023 LOWER EXTREMITY ARTERIAL DUPLEX STUDY Patient Name:  Lauren [name]  Date of Exam:   05/23/2023 Medical Rec #: [id number]        Accession #:    [id number] Date of Birth: 03/10/1981        Patient Gender: F Patient Age:   15 years Exam Location:  [facility] Procedure:      VAS [name] LOWER EXTREMITY ARTERIAL DUPLEX Referring Phys: Lelon Mast RHYNE --------------------------------------------------------------------------------  Indications: Peripheral artery disease. High  Risk Factors: Hypertension, hyperlipidemia, Diabetes, past history of                    smoking, prior CVA.   Vascular Interventions: Left ATA, DPA, and mid SFA angioplasty with stenting to                         the mid SFA 06/10/22. Current ABI:            R: non comp/0.56 L:0.79/0.55 Limitations: Patient constantly moving, complaining of pain with scan,              significantly edematous left lower extremity. Comparison Study: Prior left LEA done 11/30/22 Performing Technologist: Sherren Kerns RVS  Examination Guidelines: A complete evaluation includes B-mode imaging, spectral Doppler, color Doppler, and power Doppler as needed of all accessible portions of each vessel. Bilateral testing is considered an integral part of a complete examination. Limited examinations for reoccurring indications may be performed as noted.   +----------+--------+-----+--------+----------+--------+ LEFT      PSV cm/sRatioStenosisWaveform  Comments +----------+--------+-----+--------+----------+--------+ CFA Prox  51                   biphasic           +----------+--------+-----+--------+----------+--------+ DFA       35                   monophasic         +----------+--------+-----+--------+----------+--------+ SFA Prox  107                  biphasic           +----------+--------+-----+--------+----------+--------+ SFA Mid                                  stent    +----------+--------+-----+--------+----------+--------+ SFA Distal65                   monophasic         +----------+--------+-----+--------+----------+--------+ POP Prox  50                   monophasic         +----------+--------+-----+--------+----------+--------+ POP Mid   113                  monophasic         +----------+--------+-----+--------+----------+--------+ POP Distal68                   monophasic         +----------+--------+-----+--------+----------+--------+ ATA Distal28                   monophasic         +----------+--------+-----+--------+----------+--------+ PTA Prox  53                    monophasic         +----------+--------+-----+--------+----------+--------+ Significant edema noted throughout left lower extremity, limiting imaging.  Left Stent(s): +--------------+--++--------++ Prox to Stent 88biphasic +--------------+--++--------++ Proximal Stent86biphasic +--------------+--++--------++ Mid Stent     87         +--------------+--++--------++    Summary: Left: Mid SFA stent patent, biphasic waveforms noted. Collaterals noted in the proximal and distal calf. Waveforms become monophasic in the distal SFA, popliteal, and the visualized portions of the PT and AT arteries.  See table(s) above for measurements and  observations. Electronically signed by Carolynn Sayers on 05/24/2023 at 7:43:44 AM.    Final    US RENAL Result Date: 05/18/2023 CLINICAL DATA:  Acute kidney injury EXAM: RENAL / URINARY TRACT ULTRASOUND COMPLETE COMPARISON:  None Available. FINDINGS: Right Kidney: Renal measurements: 10.2 x 4.8 x 4.9 cm = volume: 126 mL. Normal renal cortical echogenicity and thickness. No hydronephrosis. 6 mm nonobstructing calculus is seen the within the lower pole the right kidney, similar prior examination. Left Kidney: Renal measurements: 9.2 x 6.0 x 5.7 cm = volume: 164 mL. There is limited visualization of the upper and lower pole of the left kidney due to overlying bowel gas and osseous structures. Normal renal cortical echogenicity. No hydronephrosis. No intrarenal masses or calcifications seen within the visualized portion in Bladder: Decompressed Other: None. IMPRESSION: 1. No hydronephrosis. 2. 6 mm nonobstructing right renal calculus. Electronically Signed   By: Helyn Numbers M.D.   On: 05/18/2023 20:18   MR FOOT RIGHT W WO CONTRAST Result Date: 05/18/2023 CLINICAL DATA:  Soft tissue infection suspected. Right foot cellulitis. Evaluate for deep space infection/osteomyelitis. EXAM: MRI OF THE RIGHT FOREFOOT WITHOUT AND WITH CONTRAST TECHNIQUE: Multiplanar,  multisequence MR imaging of the right forefoot was performed before and after the administration of intravenous contrast. CONTRAST:  9mL GADAVIST GADOBUTROL 1 MMOL/ML IV SOLN COMPARISON:  Right foot radiographs 05/17/2023, 02/12/2022 FINDINGS: Despite efforts by the technologist and patient, moderate motion artifact is present on today's exam and could not be eliminated. This reduces exam sensitivity and specificity. Bones/Joint/Cartilage There is a defect/ulcer at the distal dorsal aspect of the great toe (sagittal series 9, image 22). There is cortical erosion at the distal dorsal aspect of the adjacent distal phalanx, and marrow edema throughout the distal 50% of the distal phalanx of the great toe. Possible minimal increased STIR signal within the distal aspect of the distal phalanx of the fifth toe (sagittal series 9, image 5). Due to the small size and slice selection, it is unclear whether this is replicated on the other sequences. Recommend clinical correlation for possible infection within the fifth toe that would raise suspicion for acute osteomyelitis. There is again old non corticated fragmentation of the distal medial aspect of the second metatarsal head, as seen on prior radiographs. There is also mild irregularity and cortical flattening of the distal medial aspect of the third metatarsal head. Chronic regularity along the base of the fourth toe proximal phalanx, the sequela of remote trauma. Mild great toe metatarsophalangeal cartilage thinning and peripheral spurring. High-grade partial and full-thickness cartilage loss throughout the navicular-cuneiform articulations. Moderate thinning of the talonavicular cartilage with mild dorsal degenerative osteophytosis. Ligaments The Lisfranc ligament complex is intact. The metatarsophalangeal and interphalangeal collateral ligaments appear intact. Muscles and Tendons There is diffuse nonspecific edema seen within the midfoot and forefoot musculature,  greatest within the abductor hallucis and flexor digitorum brevis. No gross tendon tear is seen. Soft tissues Moderate diffuse subcutaneous fat edema and swelling, greatest within the medial and lateral ankle and dorsal midfoot to forefoot. IMPRESSION: 1. There is a defect/ulcer at the distal dorsal aspect of the great toe. There is cortical erosion at the distal dorsal aspect of the adjacent distal phalanx, and marrow edema throughout the distal 50% of the distal phalanx of the great toe. This is consistent with acute osteomyelitis. 2. Possible minimal increased STIR signal within the distal aspect of the distal phalanx of the fifth toe. Due to the small size and slice selection, it is unclear whether  this is replicated on the other sequences. Recommend clinical correlation for possible soft tissue infection within the fifth toe that would raise suspicion for acute osteomyelitis. 3. Moderate diffuse subcutaneous fat edema and swelling, greatest within the medial and lateral ankle and dorsal midfoot to forefoot. This is nonspecific but can be seen with cellulitis. 4. Diffuse nonspecific edema/myositis within the midfoot and forefoot musculature, greatest within the abductor hallucis and flexor digitorum brevis. Electronically Signed   By: Neita Garnet M.D.   On: 05/18/2023 19:54   CT Head Wo Contrast Result Date: 05/17/2023 CLINICAL DATA:  Altered mental status EXAM: CT HEAD WITHOUT CONTRAST TECHNIQUE: Contiguous axial images were obtained from the base of the skull through the vertex without intravenous contrast. RADIATION DOSE REDUCTION: This exam was performed according to the departmental dose-optimization program which includes automated exposure control, adjustment of the mA and/or kV according to patient size and/or use of iterative reconstruction technique. COMPARISON:  04/21/2023 FINDINGS: Brain: Posterior right hemisphere encephalomalacia. Ex vacuo dilatation of the right lateral ventricle. No mass,  hemorrhage or extra-axial collection. 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 Bilateral carotid atherosclerotic calcification. Skull: The visualized skull base, calvarium and extracranial soft tissues are normal. Sinuses/Orbits: No fluid levels or advanced mucosal thickening of the visualized paranasal sinuses. No mastoid or middle ear effusion. Normal orbits. Other: None. IMPRESSION: 1. No acute intracranial abnormality. 2. Posterior right hemisphere encephalomalacia. 3. 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 Electronically Signed   By: Deatra Robinson M.D.   On: 05/17/2023 21:04   DG Foot Complete Right Result Date: 05/17/2023 CLINICAL DATA:  Redness and swelling in foot. EXAM: RIGHT FOOT COMPLETE - 3+ VIEW COMPARISON:  02/12/2022. FINDINGS: No acute fracture or dislocation. No aggressive osseous lesion. There is old healed injury of the head of the second and third metatarsal. Ankle mortise appears intact. No focal soft tissue swelling. No radiopaque foreign bodies. IMPRESSION: No acute osseous injury of the right foot. Electronically Signed   By: Jules Schick M.D.   On: 05/17/2023 19:13   DG Chest Port 1 View Result Date: 05/17/2023 CLINICAL DATA:  Chest pain EXAM: PORTABLE CHEST 1 VIEW COMPARISON:  04/29/2023 FINDINGS: Low lung volumes. Mild cardiomegaly. No acute airspace disease, pleural effusion, or pneumothorax. Healing left proximal humerus fracture IMPRESSION: No active disease. Low lung volumes. Mild cardiomegaly. Electronically Signed   By: Jasmine Pang M.D.   On: 05/17/2023 19:10   DG Shoulder Left Result Date: 05/12/2023 Images of the left shoulder Proximal humerus fracture Slightly comminuted fracture neck of the left proximal humerus No major change noted from the x-ray taken at the time of injury Proximal humerus fracture meets criteria for nondisplaced fracture   US RENAL Result Date: 04/30/2023 CLINICAL DATA:  Acute on chronic renal failure EXAM:  RENAL / URINARY TRACT ULTRASOUND COMPLETE COMPARISON:  01/10/2022, 08/03/2022 FINDINGS: Right Kidney: Renal measurements: 9.1 x 4.9 x 6.0 cm = volume: 138.4 mL. Normal renal echotexture. Nonobstructing 6 mm calculus lower pole right kidney. No hydronephrosis. Left Kidney: Not visualized due to overlying bowel gas. Bladder: Appears normal for degree of bladder distention. Other: None. IMPRESSION: 1. Nonvisualization of the left kidney due to poor acoustic window and overlying bowel gas. 2. Nonobstructing 6 mm right renal calculus. Otherwise unremarkable right kidney. Electronically Signed   By: Sharlet Salina M.D.   On: 04/30/2023 17:25   ECHOCARDIOGRAM COMPLETE Result Date: 04/30/2023    ECHOCARDIOGRAM REPORT   Patient Name:   Lauren  [name] Date of Exam: 04/30/2023 Medical Rec #:  [id number]       Height:       65.0 in Accession #:    [id number]      Weight:       218.5 lb Date of Birth:  1980/07/18       BSA:          2.054 m Patient Age:    42 years        BP:           134/73 mmHg Patient Gender: F               HR:           108 bpm. Exam Location:  Inpatient Procedure: 2D Echo, Color Doppler, Cardiac Doppler and Intracardiac            Opacification Agent Indications:    Elevated troponin  History:        Patient has prior history of Echocardiogram examinations, most                 recent 07/24/2022.  Sonographer:    Harriette Bouillon RDCS Referring Phys: [id number] Jonita Albee IMPRESSIONS  1. Left ventricular ejection fraction, by estimation, is 30 to 35%. The left ventricle has moderately decreased function. The left ventricle demonstrates global hypokinesis. The left ventricular internal cavity size was moderately dilated. Left ventricular diastolic parameters are indeterminate.  2. Right ventricular systolic function is normal. The right ventricular size is normal.  3. Left atrial size was mildly dilated.  4. The mitral valve is normal in structure. Trivial mitral valve regurgitation. No evidence of  mitral stenosis.  5. The aortic valve is tricuspid. Aortic valve regurgitation is not visualized. No aortic stenosis is present.  6. The inferior vena cava is normal in size with greater than 50% respiratory variability, suggesting right atrial pressure of 3 mmHg. FINDINGS  Left Ventricle: Left ventricular ejection fraction, by estimation, is 30 to 35%. The left ventricle has moderately decreased function. The left ventricle demonstrates global hypokinesis. Definity contrast agent was given IV to delineate the left ventricular endocardial borders. The left ventricular internal cavity size was moderately dilated. There is no left ventricular hypertrophy. Left ventricular diastolic parameters are indeterminate. Right Ventricle: The right ventricular size is normal. No increase in right ventricular wall thickness. Right ventricular systolic function is normal. Left Atrium: Left atrial size was mildly dilated. Right Atrium: Right atrial size was normal in size. Pericardium: Trivial pericardial effusion is present. The pericardial effusion is posterior to the left ventricle. Mitral Valve: The mitral valve is normal in structure. Trivial mitral valve regurgitation. No evidence of mitral valve stenosis. Tricuspid Valve: The tricuspid valve is normal in structure. Tricuspid valve regurgitation is mild . No evidence of tricuspid stenosis. Aortic Valve: The aortic valve is tricuspid. Aortic valve regurgitation is not visualized. No aortic stenosis is present. Pulmonic Valve: The pulmonic valve was normal in structure. Pulmonic valve regurgitation is not visualized. No evidence of pulmonic stenosis. Aorta: The aortic root is normal in size and structure. Venous: The inferior vena cava is normal in size with greater than 50% respiratory variability, suggesting right atrial pressure of 3 mmHg. IAS/Shunts: No atrial level shunt detected by color flow Doppler.  LEFT VENTRICLE PLAX 2D LVIDd:         5.30 cm      Diastology LVIDs:          4.00 cm  LV e' lateral: 11.50 cm/s LV PW:         1.00 cm LV IVS:        1.00 cm LVOT diam:     1.70 cm LV SV:         35 LV SV Index:   17 LVOT Area:     2.27 cm  LV Volumes (MOD) LV vol d, MOD A2C: 186.0 ml LV vol d, MOD A4C: 252.0 ml LV vol s, MOD A2C: 96.3 ml LV vol s, MOD A4C: 161.0 ml LV SV MOD A2C:     89.7 ml LV SV MOD A4C:     252.0 ml LV SV MOD BP:      92.2 ml LEFT ATRIUM             Index LA diam:        3.90 cm 1.90 cm/m LA Vol (A2C):   24.1 ml 11.73 ml/m LA Vol (A4C):   52.9 ml 25.76 ml/m LA Biplane Vol: 37.3 ml 18.16 ml/m  AORTIC VALVE LVOT Vmax:   85.40 cm/s LVOT Vmean:  55.200 cm/s LVOT VTI:    0.155 m  AORTA Ao Root diam: 3.10 cm Ao Asc diam:  2.70 cm  SHUNTS Systemic VTI:  0.16 m Systemic Diam: 1.70 cm Charlton Haws MD Electronically signed by Charlton Haws MD Signature Date/Time: 04/30/2023/2:20:11 PM    Final    DG Chest Port 1 View Result Date: 04/29/2023 CLINICAL DATA:  Weakness. EXAM: PORTABLE CHEST 1 VIEW COMPARISON:  Chest radiograph dated April 21, 2023. FINDINGS: Low lung volumes with accentuation of the cardiomediastinal silhouette. No focal consolidation. The left costophrenic angle is not completely included within the field of view. No sizable pleural effusion or pneumothorax identified. No acute osseous abnormality. IMPRESSION: Low lung volume.  No acute cardiopulmonary findings. Electronically Signed   By: Hart Robinsons M.D.   On: 04/29/2023 10:34      Subjective:   Discharge Exam: Vitals:   05/28/23 0725 05/28/23 0833  BP:  (!) 153/94  Pulse: 90   Resp: 18   Temp: (!) 97.3 F (36.3 C)   SpO2: 100%    Vitals:   05/27/23 2205 05/28/23 0421 05/28/23 0725 05/28/23 0833  BP: 125/61 (!) 152/80  (!) 153/94  Pulse: 91 91 90   Resp: 16 18 18    Temp: 98 F (36.7 C) 97.8 F (36.6 C) (!) 97.3 F (36.3 C)   TempSrc: Oral Oral Oral   SpO2: 100% 100% 100%   Weight:      Height:          The results of significant diagnostics from this  hospitalization (including imaging, microbiology, ancillary and laboratory) are listed below for reference.     Microbiology: Recent Results (from the past 240 hours)  Culture, blood (Routine X 2) w Reflex to ID Panel     Status: None   Collection Time: 05/19/23 12:17 PM   Specimen: BLOOD  Result Value Ref Range Status   Specimen Description BLOOD BLOOD RIGHT ARM  Final   Special Requests   Final    BOTTLES DRAWN AEROBIC ONLY Blood Culture results may not be optimal due to an inadequate volume of blood received in culture bottles   Culture   Final    NO GROWTH 5 DAYS Performed at [facility], [address], [place], [place] [address]    Report Status 05/24/2023 FINAL  Final  Culture, blood (Routine X 2) w Reflex to ID Panel  Status: None   Collection Time: 05/19/23 12:21 PM   Specimen: BLOOD  Result Value Ref Range Status   Specimen Description BLOOD BLOOD RIGHT ARM  Final   Special Requests   Final    BOTTLES DRAWN AEROBIC AND ANAEROBIC Blood Culture adequate volume   Culture   Final    NO GROWTH 5 DAYS Performed at [facility], [address] [place], [place] [address]    Report Status 05/24/2023 FINAL  Final  Surgical PCR screen     Status: None   Collection Time: 05/24/23  9:22 PM   Specimen: Nasal Mucosa; Nasal Swab  Result Value Ref Range Status   MRSA, PCR NEGATIVE NEGATIVE Final   Staphylococcus aureus NEGATIVE NEGATIVE Final    Comment: (NOTE) The Xpert SA Assay (FDA approved for NASAL specimens in patients 67 years of age and older), is one component of a comprehensive surveillance program. It is not intended to diagnose infection nor to guide or monitor treatment. Performed at [facility], [address], [place], [place] [address]   Aerobic/Anaerobic Culture w Gram Stain (surgical/deep wound)     Status: None (Preliminary result)   Collection Time: 05/25/23 10:15 AM   Specimen: PATH Bone biopsy; Tissue  Result Value Ref Range Status   Specimen  Description TISSUE  Final   Special Requests RIGHT DISTAL PHALANX  Final   Gram Stain NO WBC SEEN NO ORGANISMS SEEN   Final   Culture   Final    NO GROWTH 3 DAYS NO ANAEROBES ISOLATED; CULTURE IN PROGRESS FOR 5 DAYS Performed at [facility], [address] [place], [place] [address]    Report Status PENDING  Incomplete     Labs: BNP (last 3 results) Recent Labs    07/27/22 0548 08/02/22 2023 11/07/22 1129  BNP 2,632.3* 869.3* 1,973.2*   Basic Metabolic Panel: Recent Labs  Lab 05/24/23 9147 05/25/23 0611 05/26/23 0429 05/27/23 0435 05/28/23 0946  NA 138 136 130* 137 140  K 3.7 3.7 3.8 4.5 3.8  CL 111 109 100 107 107  CO2 18* 18* 20* 21* 20*  GLUCOSE 191* 87 316* 284* 148*  BUN 19 16 22* 22* 22*  CREATININE 2.18* 1.89* 2.12* 2.21* 2.04*  CALCIUM 8.1* 8.2* 7.7* 8.2* 8.2*   Liver Function Tests: No results for input(s): "AST", "ALT", "ALKPHOS", "BILITOT", "PROT", "ALBUMIN" in the last 168 hours. No results for input(s): "LIPASE", "AMYLASE" in the last 168 hours. No results for input(s): "AMMONIA" in the last 168 hours. CBC: Recent Labs  Lab 05/24/23 0623 05/25/23 0611 05/26/23 0429 05/27/23 0435 05/28/23 0946  WBC 8.2 8.5 11.8* 10.3 8.0  HGB 7.8* 7.3* 7.8* 8.1* 7.4*  HCT 26.2* 24.2* 25.3* 26.0* 24.2*  MCV 90.7 90.3 91.0 90.0 89.6  PLT 360 320 264 289 253   Cardiac Enzymes: No results for input(s): "CKTOTAL", "CKMB", "CKMBINDEX", "TROPONINI" in the last 168 hours. BNP: Invalid input(s): "POCBNP" CBG: Recent Labs  Lab 05/27/23 1606 05/27/23 2126 05/27/23 2231 05/28/23 0604 05/28/23 0732  GLUCAP 123* 35* 94 84 136*   D-Dimer No results for input(s): "DDIMER" in the last 72 hours. Hgb A1c No results for input(s): "HGBA1C" in the last 72 hours. Lipid Profile No results for input(s): "CHOL", "HDL", "LDLCALC", "TRIG", "CHOLHDL", "LDLDIRECT" in the last 72 hours. Thyroid function studies No results for input(s): "TSH", "T4TOTAL", "T3FREE",  "THYROIDAB" in the last 72 hours.  Invalid input(s): "FREET3" Anemia work up No results for input(s): "VITAMINB12", "FOLATE", "FERRITIN", "TIBC", "  IRON", "RETICCTPCT" in the last 72 hours. Urinalysis    Component Value Date/Time   COLORURINE YELLOW 05/17/2023 2005   APPEARANCEUR HAZY (A) 05/17/2023 2005   LABSPEC 1.012 05/17/2023 2005   PHURINE 6.0 05/17/2023 2005   GLUCOSEU 50 (A) 05/17/2023 2005   HGBUR MODERATE (A) 05/17/2023 2005   BILIRUBINUR NEGATIVE 05/17/2023 2005   KETONESUR NEGATIVE 05/17/2023 2005   PROTEINUR 100 (A) 05/17/2023 2005   UROBILINOGEN 0.2 06/23/2014 1227   NITRITE NEGATIVE 05/17/2023 2005   LEUKOCYTESUR NEGATIVE 05/17/2023 2005   Sepsis Labs Recent Labs  Lab 05/25/23 0611 05/26/23 0429 05/27/23 0435 05/28/23 0946  WBC 8.5 11.8* 10.3 8.0   Microbiology Recent Results (from the past 240 hours)  Culture, blood (Routine X 2) w Reflex to ID Panel     Status: None   Collection Time: 05/19/23 12:17 PM   Specimen: BLOOD  Result Value Ref Range Status   Specimen Description BLOOD BLOOD RIGHT ARM  Final   Special Requests   Final    BOTTLES DRAWN AEROBIC ONLY Blood Culture results may not be optimal due to an inadequate volume of blood received in culture bottles   Culture   Final    NO GROWTH 5 DAYS Performed at [facility], [address] [place], [place] [address]    Report Status 05/24/2023 FINAL  Final  Culture, blood (Routine X 2) w Reflex to ID Panel     Status: None   Collection Time: 05/19/23 12:21 PM   Specimen: BLOOD  Result Value Ref Range Status   Specimen Description BLOOD BLOOD RIGHT ARM  Final   Special Requests   Final    BOTTLES DRAWN AEROBIC AND ANAEROBIC Blood Culture adequate volume   Culture   Final    NO GROWTH 5 DAYS Performed at [facility], [address], [place], [place] [address]    Report Status 05/24/2023 FINAL  Final  Surgical PCR screen     Status: None   Collection Time: 05/24/23  9:22 PM   Specimen: Nasal  Mucosa; Nasal Swab  Result Value Ref Range Status   MRSA, PCR NEGATIVE NEGATIVE Final   Staphylococcus aureus NEGATIVE NEGATIVE Final    Comment: (NOTE) The Xpert SA Assay (FDA approved for NASAL specimens in patients 37 years of age and older), is one component of a comprehensive surveillance program. It is not intended to diagnose infection nor to guide or monitor treatment. Performed at [facility], [address], [place], [place] [address]   Aerobic/Anaerobic Culture w Gram Stain (surgical/deep wound)     Status: None (Preliminary result)   Collection Time: 05/25/23 10:15 AM   Specimen: PATH Bone biopsy; Tissue  Result Value Ref Range Status   Specimen Description TISSUE  Final   Special Requests RIGHT DISTAL PHALANX  Final   Gram Stain NO WBC SEEN NO ORGANISMS SEEN   Final   Culture   Final    NO GROWTH 3 DAYS NO ANAEROBES ISOLATED; CULTURE IN PROGRESS FOR 5 DAYS Performed at [facility], [address] [place], [place] [address]    Report Status PENDING  Incomplete    Please note: You were cared for by a hospitalist during your hospital stay. Once you are discharged, your primary care physician will handle any further medical issues. Please note that NO REFILLS for any discharge medications will be authorized once you are discharged, as it is imperative that you return to your primary care physician (or establish a relationship  with a primary care physician if you do not have one) for your post hospital discharge needs so that they can reassess your need for medications and monitor your lab values.    Time coordinating discharge: 40 minutes  SIGNED:   Burnadette Pop, MD  [facility] 05/28/2023, 2:07 PM Pager [phone]  If 7PM-7AM, please contact night-coverage [email or web address] Password [email or web address]

## 2023-05-28 NOTE — Progress Notes (Signed)
[facility] night cross cover note:   I was notified by RN of the patient's request for nicotine patch in the setting of smoking 1.5 packs/day.  I subsequently ordered 21 mg/day nicotine patch for her.    Newton Pigg, DO Hospitalist

## 2023-05-29 NOTE — Anesthesia Postprocedure Evaluation (Signed)
Anesthesia Post Note  Patient: Lauren [name]  Procedure(s) Performed: BONE BIOPSY OF RIGHT GREAT TOE (Right)     Patient location during evaluation: PACU Anesthesia Type: MAC Level of consciousness: awake and alert Pain management: pain level controlled Vital Signs Assessment: post-procedure vital signs reviewed and stable Respiratory status: spontaneous breathing, nonlabored ventilation and respiratory function stable Cardiovascular status: stable and blood pressure returned to baseline Postop Assessment: no apparent nausea or vomiting Anesthetic complications: no   No notable events documented.               Bryndle Corredor

## 2023-05-30 ENCOUNTER — Other Ambulatory Visit: Payer: Self-pay

## 2023-05-30 ENCOUNTER — Telehealth: Payer: Self-pay

## 2023-05-30 ENCOUNTER — Other Ambulatory Visit (HOSPITAL_COMMUNITY): Payer: Self-pay

## 2023-05-30 DIAGNOSIS — I70238 Atherosclerosis of native arteries of right leg with ulceration of other part of lower right leg: Secondary | ICD-10-CM

## 2023-05-30 DIAGNOSIS — I739 Peripheral vascular disease, unspecified: Secondary | ICD-10-CM

## 2023-05-30 LAB — AEROBIC/ANAEROBIC CULTURE W GRAM STAIN (SURGICAL/DEEP WOUND)
Culture: NO GROWTH
Gram Stain: NONE SEEN

## 2023-05-30 NOTE — Telephone Encounter (Signed)
Per MD staff message, pt left AMA this weekend. Her surgery scheduled for 06/01/23 has been changed to reflect that she will be admitted. I have attempted to reach pt to give her pre op instructions on both her phone and her alt contact (her daughter). Pt's VM is full and unable to leave VM.

## 2023-05-31 ENCOUNTER — Encounter (HOSPITAL_COMMUNITY): Payer: Self-pay | Admitting: Vascular Surgery

## 2023-05-31 ENCOUNTER — Other Ambulatory Visit: Payer: Self-pay

## 2023-05-31 ENCOUNTER — Inpatient Hospital Stay (HOSPITAL_COMMUNITY): Admission: RE | Admit: 2023-05-31 | Payer: 59 | Source: Ambulatory Visit

## 2023-05-31 ENCOUNTER — Inpatient Hospital Stay (HOSPITAL_COMMUNITY): Payer: 59

## 2023-05-31 ENCOUNTER — Ambulatory Visit: Payer: 59

## 2023-05-31 NOTE — Anesthesia Preprocedure Evaluation (Signed)
Anesthesia Evaluation  Patient identified by MRN, date of birth, ID band Patient awake    Reviewed: Allergy & Precautions, NPO status , Patient's Chart, lab work & pertinent test results, reviewed documented beta blocker date and time   History of Anesthesia Complications (+) PONV  Airway Mallampati: II  TM Distance: >3 FB Neck ROM: Full    Dental  (+) Dental Advisory Given   Pulmonary COPD, Current Smoker and Patient abstained from smoking.   breath sounds clear to auscultation       Cardiovascular hypertension, Pt. on medications and Pt. on home beta blockers (-) angina + CAD and +CHF   Rhythm:Regular Rate:Normal  04/2023 ECHO: EF 30 to 35%.  1. The LV has moderately decreased function, global hypokinesis. The left ventricular internal cavity size was moderately dilated.   2. RVF is normal. The right ventricular size is normal.   3. Left atrial size was mildly dilated.   4. The mitral valve is normal in structure. Trivial mitral valve regurgitation. No evidence of mitral stenosis.   5. The aortic valve is tricuspid. Aortic valve regurgitation is not visualized. No aortic stenosis is present.     Neuro/Psych CVA (L hand weakness), Residual Symptoms    GI/Hepatic Neg liver ROS,GERD  Controlled,,  Endo/Other  diabetes (glu 145), Insulin Dependent  BMI 35  Renal/GU Renal InsufficiencyRenal disease     Musculoskeletal   Abdominal   Peds  Hematology  (+) Blood dyscrasia (Hb 7.4), anemia Plavix, coumadin   Anesthesia Other Findings   Reproductive/Obstetrics                              Anesthesia Physical Anesthesia Plan  ASA: 4  Anesthesia Plan: General   Post-op Pain Management: Tylenol PO (pre-op)*   Induction: Intravenous  PONV Risk Score and Plan: 3 and Ondansetron, Dexamethasone and Scopolamine patch - Pre-op  Airway Management Planned: Oral ETT  Additional Equipment:  Arterial line  Intra-op Plan:   Post-operative Plan: Extubation in OR  Informed Consent: I have reviewed the patients History and Physical, chart, labs and discussed the procedure including the risks, benefits and alternatives for the proposed anesthesia with the patient or authorized representative who has indicated his/her understanding and acceptance.     Dental advisory given  Plan Discussed with: CRNA and Surgeon  Anesthesia Plan Comments: (PAT note by Antionette Poles, PA-C: 43 y.o. 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  Most recently she was admitted 1/14 through 05/28/2023 after presenting to the ED with multiple complaints including chest discomfort, feeling generally poorly, poor oral intake, 3 to 4 days of poor urine output. She had recent admits 04/21/23- 04/24/23 for DKA and UTI and 03/2023 with seizure, DKA, and AKI. MRI of the right foot 1/15 suggested acute osteomyelitis.  Vascular surgery also consulted as per podiatry recommendation.  Underwent bone biopsy, excisional debridement of right foot ulcer on 1/22.  Status post right lower extremity diagnostic angiogram by vascular surgery on 1/23.  While on procedure, she became hypoxic and developed stroke symptoms with unresponsiveness, facial droop.  Code stroke called.  MRI showed evidence of late subacute lacunar infarct in the left temporoparietal white matter. EEG showed cortical dysfunction arising from the right hemisphere  likely from underlying encephalomalacia but no seizure or epileptiform discharge.  Stroke team cleared the patient for planned vascular procedure. She has history of CVA/carotid stenosis.Right hemisphere encephalomalacia noted on CT head with stent in  right ICA.  Has chronic left hemiparesis, weakness more on the left upper extremity with chronic edema.  During admission she also received 1 unit of blood and a dose of IV iron for history of anemia, hemoglobin in the range of 8.  Surgery was initially planned to be done while admitted, however, patient left AMA on 05/28/2023.  Patient was last seen by cardiologist Dr. Eden Emms 05/01/2023 during admission for DKA and UTI.  Per note, "Seen by Dr Tresa Endo 04/29/23 Despite troponin 6842 and slight inferior/precordial ST elevation not thought to be a candidate for cath given DKA, lactic acidosis, encephalopathy, DNR and renal failure with Cr  3.31 this am. Has known CAD from cath 07/26/22 Occluded mid LAD and small diffusely dx RCA with occluded distal vessel No targets for revasularization. Admitted with recurrent sepsis ? UTI and DKA. On rocephin and insulin drip BP improved  EF 30-35% by TTE March 2024 pending this admission Appears to be only on low dose coumadin. Hct falling 24.9 this am with normal PLT 383 Started low dose ASA and coreg. Not a candidate for ACE/ARB/ARNI with significant renal failure Cr improved today 2.71, Troponin coming down 3931 Surprisingly no change in EF by TTE 04/30/23 EF still 30-35%. Cardiology will sign off outpatient f/u Dr Dominga Ferry "   BMP and CBC from 05/28/2023 reviewed, creatinine stable at 2.04, chronic anemia with hemoglobin 7.4, otherwise unremarkable.  She will need day of surgery evaluation.  TTE 04/30/2023:  1. Left ventricular ejection fraction, by estimation, is 30 to 35%. The  left ventricle has moderately decreased function. The left ventricle  demonstrates global hypokinesis. The left ventricular internal cavity size  was moderately dilated. Left  ventricular diastolic parameters are indeterminate.   2. Right ventricular systolic function is normal. The right ventricular  size is normal.   3. Left atrial size was mildly dilated.   4. The mitral valve is  normal in structure. Trivial mitral valve  regurgitation. No evidence of mitral stenosis.   5. The aortic valve is tricuspid. Aortic valve regurgitation is not  visualized. No aortic stenosis is present.   6. The inferior vena cava is normal in size with greater than 50%  respiratory variability, suggesting right atrial pressure of 3 mmHg.   Cath 07/26/2022:    Ost LAD to Prox LAD lesion is 75% stenosed.   Prox LAD to Mid LAD lesion is 100% stenosed.   Prox Cx to Dist Cx lesion is 35% stenosed with 35% stenosed side branch in LPAV.   Prox RCA lesion is 95% stenosed.   Mid RCA lesion is 95% stenosed.   Dist RCA lesion is 100% stenosed.   LV end diastolic pressure is mildly elevated.   1. 2 vessel occlusive CAD. The LAD is occluded after a small diagonal. The RCA is small and diffusely diseased. It is occluded distally 2. Mildly elevated LVEDP   Plan: medical management. No suitable targets for revascularization.    )         Anesthesia Quick Evaluation

## 2023-05-31 NOTE — Progress Notes (Signed)
Anesthesia Chart Review: Same day workup  43 y.o. 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  Most recently she was admitted 1/14 through 05/28/2023 after presenting to the ED with multiple complaints including chest discomfort, feeling generally poorly, poor oral intake, 3 to 4 days of poor urine output. She had recent admits 04/21/23- 04/24/23 for DKA and UTI and 03/2023 with seizure, DKA, and AKI. MRI of the right foot 1/15 suggested acute osteomyelitis.  Vascular surgery also consulted as per podiatry recommendation.  Underwent bone biopsy, excisional debridement of right foot ulcer on 1/22.  Status post right lower extremity diagnostic angiogram by vascular surgery on 1/23.  While on procedure, she became hypoxic and developed stroke symptoms with unresponsiveness, facial droop.  Code stroke called.  MRI showed evidence of late subacute lacunar infarct in the left temporoparietal white matter. EEG showed cortical dysfunction arising from the right hemisphere likely from underlying encephalomalacia but no seizure or epileptiform discharge.  Stroke team cleared the patient for planned vascular procedure. She has history of CVA/carotid stenosis.Right hemisphere encephalomalacia noted on CT head with stent in right ICA.  Has chronic left hemiparesis, weakness more on the left upper extremity with chronic edema.  During admission she also received 1 unit of blood and a dose of IV iron for history of anemia, hemoglobin in the range of 8.  Surgery was initially planned to be done while admitted, however, patient left AMA on 05/28/2023.  Patient was last seen by cardiologist Dr. Eden Emms 05/01/2023 during admission for DKA and UTI.  Per note, "Seen by Dr  Tresa Endo 04/29/23 Despite troponin 6842 and slight inferior/precordial ST elevation not thought to be a candidate for cath given DKA, lactic acidosis, encephalopathy, DNR and renal failure with Cr  3.31 this am. Has known CAD from cath 07/26/22 Occluded mid LAD and small diffusely dx RCA with occluded distal vessel No targets for revasularization. Admitted with recurrent sepsis ? UTI and DKA. On rocephin and insulin drip BP improved  EF 30-35% by TTE March 2024 pending this admission Appears to be only on low dose coumadin. Hct falling 24.9 this am with normal PLT 383 Started low dose ASA and coreg. Not a candidate for ACE/ARB/ARNI with significant renal failure Cr improved today 2.71, Troponin coming down 3931 Surprisingly no change in EF by TTE 04/30/23 EF still 30-35%. Cardiology will sign off outpatient f/u Dr Dominga Ferry "   BMP and CBC from 05/28/2023 reviewed, creatinine stable at 2.04, chronic anemia with hemoglobin 7.4, otherwise unremarkable.  She will need day of surgery evaluation.  TTE 04/30/2023:  1. Left ventricular ejection fraction, by estimation, is 30 to 35%. The  left ventricle has moderately decreased function. The left ventricle  demonstrates global hypokinesis. The left ventricular internal cavity size  was moderately dilated. Left  ventricular diastolic parameters are indeterminate.   2. Right ventricular systolic function is normal. The right ventricular  size is normal.   3. Left atrial size was mildly dilated.   4. The mitral valve is normal in structure. Trivial mitral valve  regurgitation. No evidence of mitral stenosis.   5. The aortic valve is tricuspid. Aortic valve regurgitation is not  visualized. No aortic stenosis is present.   6. The inferior vena cava is normal in size with greater  than 50%  respiratory variability, suggesting right atrial pressure of 3 mmHg.   Cath 07/26/2022:    Ost LAD to Prox LAD lesion is 75% stenosed.   Prox LAD to Mid LAD lesion is 100%  stenosed.   Prox Cx to Dist Cx lesion is 35% stenosed with 35% stenosed side branch in LPAV.   Prox RCA lesion is 95% stenosed.   Mid RCA lesion is 95% stenosed.   Dist RCA lesion is 100% stenosed.   LV end diastolic pressure is mildly elevated.   2 vessel occlusive CAD. The LAD is occluded after a small diagonal. The RCA is small and diffusely diseased. It is occluded distally Mildly elevated LVEDP   Plan: medical management. No suitable targets for revascularization.     [facility] Short Stay Center/Anesthesiology Phone [phone] 05/31/2023 10:17 AM

## 2023-05-31 NOTE — Progress Notes (Signed)
SDW CALL  Patient was given pre-op instructions over the phone. The opportunity was given for the patient to ask questions. No further questions asked. Patient verbalized understanding of instructions given.   PCP - Dr. Christel Mormon Cardiologist - Dr. Dina Rich  PPM/ICD - denies Device Orders - n/a Rep Notified - n/a  Chest x-ray - 05/17/23 - 1 view EKG - 05/17/23 Stress Test - 02/01/20 ECHO - 04/30/23 Cardiac Cath - 07/26/22  Sleep Study - stated negative for sleep apnea CPAP - n/a  Fasting Blood Sugar - 150-170 Patient wears a Dexcom  Patient has an insulin pump - instructed patient to leave insulin pump on but to reduce rate by 20% at midnight.  Patient agrees to discuss plan with anesthesiologist on DOS and is aware that the pump may need to be removed at that time.    Last dose of GLP1 agonist-  n/a GLP1 instructions: n/a  Blood Thinner Instructions:  Patient states that last dose of Warfarin was 4 days ago and last dose of Plavix was on 1/27.   Patient is aware not to take Warfarin or Plavix today or tomorrow per surgeon's instructions.   Aspirin Instructions: n/a  Patient states that last dose of Nitroglycerin was about 6 months ago.  Patient states that she has not had seizure since she was in the hospital.  Patient denies chest pain at this time.    ERAS Protcol -  NPO   COVID TEST-  n/a   Anesthesia review:   yes  Patient denies shortness of breath, fever, cough and chest pain over the phone call   All instructions explained to the patient, with a verbal understanding of the material. Patient agrees to go over the instructions while at home for a better understanding.

## 2023-06-01 ENCOUNTER — Encounter (HOSPITAL_COMMUNITY)
Admission: RE | Disposition: A | Discharge status: Home / Self Care | Payer: Self-pay | Source: Home / Self Care | Attending: Vascular Surgery

## 2023-06-01 ENCOUNTER — Inpatient Hospital Stay (HOSPITAL_COMMUNITY)
Admission: RE | Admit: 2023-06-01 | Discharge: 2023-06-09 | DRG: 253 | Disposition: A | Discharge status: Home / Self Care | Payer: 59 | Attending: Vascular Surgery | Admitting: Vascular Surgery

## 2023-06-01 ENCOUNTER — Ambulatory Visit: Payer: Commercial Managed Care - PPO

## 2023-06-01 ENCOUNTER — Encounter (HOSPITAL_COMMUNITY): Payer: Commercial Managed Care - PPO

## 2023-06-01 ENCOUNTER — Telehealth: Payer: Self-pay | Admitting: Podiatry

## 2023-06-01 ENCOUNTER — Inpatient Hospital Stay (HOSPITAL_COMMUNITY): Payer: Self-pay | Admitting: Physician Assistant

## 2023-06-01 ENCOUNTER — Other Ambulatory Visit: Payer: Self-pay

## 2023-06-01 ENCOUNTER — Other Ambulatory Visit (HOSPITAL_COMMUNITY): Payer: Self-pay

## 2023-06-01 DIAGNOSIS — F17210 Nicotine dependence, cigarettes, uncomplicated: Secondary | ICD-10-CM

## 2023-06-01 DIAGNOSIS — D62 Acute posthemorrhagic anemia: Secondary | ICD-10-CM | POA: Diagnosis not present

## 2023-06-01 DIAGNOSIS — E1022 Type 1 diabetes mellitus with diabetic chronic kidney disease: Secondary | ICD-10-CM | POA: Diagnosis present

## 2023-06-01 DIAGNOSIS — Z87440 Personal history of urinary (tract) infections: Secondary | ICD-10-CM

## 2023-06-01 DIAGNOSIS — Z79890 Hormone replacement therapy (postmenopausal): Secondary | ICD-10-CM | POA: Diagnosis not present

## 2023-06-01 DIAGNOSIS — Z8673 Personal history of transient ischemic attack (TIA), and cerebral infarction without residual deficits: Secondary | ICD-10-CM | POA: Diagnosis not present

## 2023-06-01 DIAGNOSIS — E1051 Type 1 diabetes mellitus with diabetic peripheral angiopathy without gangrene: Secondary | ICD-10-CM | POA: Diagnosis present

## 2023-06-01 DIAGNOSIS — I509 Heart failure, unspecified: Secondary | ICD-10-CM | POA: Diagnosis not present

## 2023-06-01 DIAGNOSIS — Z7902 Long term (current) use of antithrombotics/antiplatelets: Secondary | ICD-10-CM

## 2023-06-01 DIAGNOSIS — Z8249 Family history of ischemic heart disease and other diseases of the circulatory system: Secondary | ICD-10-CM

## 2023-06-01 DIAGNOSIS — E10649 Type 1 diabetes mellitus with hypoglycemia without coma: Secondary | ICD-10-CM | POA: Diagnosis present

## 2023-06-01 DIAGNOSIS — Z87891 Personal history of nicotine dependence: Secondary | ICD-10-CM | POA: Diagnosis not present

## 2023-06-01 DIAGNOSIS — I2510 Atherosclerotic heart disease of native coronary artery without angina pectoris: Secondary | ICD-10-CM | POA: Diagnosis not present

## 2023-06-01 DIAGNOSIS — Z5982 Transportation insecurity: Secondary | ICD-10-CM

## 2023-06-01 DIAGNOSIS — I70229 Atherosclerosis of native arteries of extremities with rest pain, unspecified extremity: Principal | ICD-10-CM | POA: Diagnosis present

## 2023-06-01 DIAGNOSIS — Z823 Family history of stroke: Secondary | ICD-10-CM | POA: Diagnosis not present

## 2023-06-01 DIAGNOSIS — I70221 Atherosclerosis of native arteries of extremities with rest pain, right leg: Principal | ICD-10-CM | POA: Diagnosis present

## 2023-06-01 DIAGNOSIS — I252 Old myocardial infarction: Secondary | ICD-10-CM

## 2023-06-01 DIAGNOSIS — L97512 Non-pressure chronic ulcer of other part of right foot with fat layer exposed: Secondary | ICD-10-CM | POA: Diagnosis present

## 2023-06-01 DIAGNOSIS — Z9049 Acquired absence of other specified parts of digestive tract: Secondary | ICD-10-CM

## 2023-06-01 DIAGNOSIS — I130 Hypertensive heart and chronic kidney disease with heart failure and stage 1 through stage 4 chronic kidney disease, or unspecified chronic kidney disease: Secondary | ICD-10-CM | POA: Diagnosis present

## 2023-06-01 DIAGNOSIS — Z5941 Food insecurity: Secondary | ICD-10-CM | POA: Diagnosis not present

## 2023-06-01 DIAGNOSIS — Z7901 Long term (current) use of anticoagulants: Secondary | ICD-10-CM | POA: Diagnosis not present

## 2023-06-01 DIAGNOSIS — F419 Anxiety disorder, unspecified: Secondary | ICD-10-CM | POA: Diagnosis present

## 2023-06-01 DIAGNOSIS — E16A2 Hypoglycemia level 2: Secondary | ICD-10-CM | POA: Diagnosis present

## 2023-06-01 DIAGNOSIS — Z9181 History of falling: Secondary | ICD-10-CM

## 2023-06-01 DIAGNOSIS — E66812 Obesity, class 2: Secondary | ICD-10-CM | POA: Diagnosis present

## 2023-06-01 DIAGNOSIS — Z881 Allergy status to other antibiotic agents status: Secondary | ICD-10-CM

## 2023-06-01 DIAGNOSIS — I70201 Unspecified atherosclerosis of native arteries of extremities, right leg: Secondary | ICD-10-CM | POA: Diagnosis not present

## 2023-06-01 DIAGNOSIS — I998 Other disorder of circulatory system: Secondary | ICD-10-CM | POA: Diagnosis not present

## 2023-06-01 DIAGNOSIS — I739 Peripheral vascular disease, unspecified: Secondary | ICD-10-CM

## 2023-06-01 DIAGNOSIS — I110 Hypertensive heart disease with heart failure: Secondary | ICD-10-CM

## 2023-06-01 DIAGNOSIS — Z89422 Acquired absence of other left toe(s): Secondary | ICD-10-CM

## 2023-06-01 DIAGNOSIS — Z888 Allergy status to other drugs, medicaments and biological substances status: Secondary | ICD-10-CM

## 2023-06-01 DIAGNOSIS — T82898A Other specified complication of vascular prosthetic devices, implants and grafts, initial encounter: Principal | ICD-10-CM | POA: Diagnosis present

## 2023-06-01 DIAGNOSIS — I5042 Chronic combined systolic (congestive) and diastolic (congestive) heart failure: Secondary | ICD-10-CM | POA: Diagnosis not present

## 2023-06-01 DIAGNOSIS — Z91041 Radiographic dye allergy status: Secondary | ICD-10-CM

## 2023-06-01 DIAGNOSIS — E10621 Type 1 diabetes mellitus with foot ulcer: Secondary | ICD-10-CM | POA: Diagnosis present

## 2023-06-01 DIAGNOSIS — E1122 Type 2 diabetes mellitus with diabetic chronic kidney disease: Secondary | ICD-10-CM | POA: Diagnosis not present

## 2023-06-01 DIAGNOSIS — Z9109 Other allergy status, other than to drugs and biological substances: Secondary | ICD-10-CM

## 2023-06-01 DIAGNOSIS — Z794 Long term (current) use of insulin: Secondary | ICD-10-CM | POA: Diagnosis not present

## 2023-06-01 DIAGNOSIS — Z6835 Body mass index (BMI) 35.0-35.9, adult: Secondary | ICD-10-CM

## 2023-06-01 DIAGNOSIS — Z5948 Other specified lack of adequate food: Secondary | ICD-10-CM

## 2023-06-01 DIAGNOSIS — N184 Chronic kidney disease, stage 4 (severe): Secondary | ICD-10-CM | POA: Diagnosis present

## 2023-06-01 DIAGNOSIS — I70238 Atherosclerosis of native arteries of right leg with ulceration of other part of lower right leg: Secondary | ICD-10-CM

## 2023-06-01 DIAGNOSIS — E1040 Type 1 diabetes mellitus with diabetic neuropathy, unspecified: Secondary | ICD-10-CM | POA: Diagnosis present

## 2023-06-01 DIAGNOSIS — Z79899 Other long term (current) drug therapy: Secondary | ICD-10-CM

## 2023-06-01 HISTORY — PX: FEMORAL-TIBIAL BYPASS GRAFT: SHX938

## 2023-06-01 HISTORY — PX: VEIN HARVEST: SHX6363

## 2023-06-01 LAB — GLUCOSE, CAPILLARY
Glucose-Capillary: 105 mg/dL — ABNORMAL HIGH (ref 70–99)
Glucose-Capillary: 111 mg/dL — ABNORMAL HIGH (ref 70–99)
Glucose-Capillary: 112 mg/dL — ABNORMAL HIGH (ref 70–99)
Glucose-Capillary: 128 mg/dL — ABNORMAL HIGH (ref 70–99)
Glucose-Capillary: 129 mg/dL — ABNORMAL HIGH (ref 70–99)
Glucose-Capillary: 143 mg/dL — ABNORMAL HIGH (ref 70–99)
Glucose-Capillary: 143 mg/dL — ABNORMAL HIGH (ref 70–99)
Glucose-Capillary: 145 mg/dL — ABNORMAL HIGH (ref 70–99)
Glucose-Capillary: 159 mg/dL — ABNORMAL HIGH (ref 70–99)
Glucose-Capillary: 171 mg/dL — ABNORMAL HIGH (ref 70–99)
Glucose-Capillary: 180 mg/dL — ABNORMAL HIGH (ref 70–99)
Glucose-Capillary: 226 mg/dL — ABNORMAL HIGH (ref 70–99)
Glucose-Capillary: 265 mg/dL — ABNORMAL HIGH (ref 70–99)
Glucose-Capillary: 275 mg/dL — ABNORMAL HIGH (ref 70–99)

## 2023-06-01 LAB — COMPREHENSIVE METABOLIC PANEL
ALT: 12 U/L (ref 0–44)
AST: 18 U/L (ref 15–41)
Albumin: 2.3 g/dL — ABNORMAL LOW (ref 3.5–5.0)
Alkaline Phosphatase: 107 U/L (ref 38–126)
Anion gap: 13 (ref 5–15)
BUN: 18 mg/dL (ref 6–20)
CO2: 19 mmol/L — ABNORMAL LOW (ref 22–32)
Calcium: 8.2 mg/dL — ABNORMAL LOW (ref 8.9–10.3)
Chloride: 104 mmol/L (ref 98–111)
Creatinine, Ser: 2.24 mg/dL — ABNORMAL HIGH (ref 0.44–1.00)
GFR, Estimated: 28 mL/min — ABNORMAL LOW (ref >60)
Glucose, Bld: 263 mg/dL — ABNORMAL HIGH (ref 70–99)
Potassium: 4.4 mmol/L (ref 3.5–5.1)
Sodium: 136 mmol/L (ref 135–145)
Total Bilirubin: 0.8 mg/dL (ref 0.0–1.2)
Total Protein: 5.1 g/dL — ABNORMAL LOW (ref 6.5–8.1)

## 2023-06-01 LAB — CBC
HCT: 25.5 % — ABNORMAL LOW (ref 36.0–46.0)
Hemoglobin: 7.8 g/dL — ABNORMAL LOW (ref 12.0–15.0)
MCH: 27.7 pg (ref 26.0–34.0)
MCHC: 30.6 g/dL (ref 30.0–36.0)
MCV: 90.4 fL (ref 80.0–100.0)
Platelets: 246 10*3/uL (ref 150–400)
RBC: 2.82 MIL/uL — ABNORMAL LOW (ref 3.87–5.11)
RDW: 17.2 % — ABNORMAL HIGH (ref 11.5–15.5)
WBC: 11.7 10*3/uL — ABNORMAL HIGH (ref 4.0–10.5)
nRBC: 0.3 % — ABNORMAL HIGH (ref 0.0–0.2)

## 2023-06-01 LAB — POCT ACTIVATED CLOTTING TIME
Activated Clotting Time: 302 s
Activated Clotting Time: 227 s

## 2023-06-01 LAB — APTT: aPTT: 30 s (ref 24–36)

## 2023-06-01 LAB — PREPARE RBC (CROSSMATCH)

## 2023-06-01 LAB — POCT PREGNANCY, URINE: Preg Test, Ur: NEGATIVE

## 2023-06-01 LAB — HCG, SERUM, QUALITATIVE: Preg, Serum: NEGATIVE

## 2023-06-01 LAB — PROTIME-INR
INR: 1.2 (ref 0.8–1.2)
Prothrombin Time: 15.7 s — ABNORMAL HIGH (ref 11.4–15.2)

## 2023-06-01 SURGERY — CREATION, BYPASS, ARTERIAL, FEMORAL TO TIBIAL, USING GRAFT
Anesthesia: General | Site: Leg Upper | Laterality: Right

## 2023-06-01 MED ORDER — ACETAMINOPHEN 325 MG PO TABS
325.0000 mg | ORAL_TABLET | ORAL | Status: DC | PRN
  Filled 2023-06-01: qty 2

## 2023-06-01 MED ORDER — CLOPIDOGREL BISULFATE 75 MG PO TABS
75.0000 mg | ORAL_TABLET | Freq: Every day | ORAL | Status: DC
  Administered 2023-06-02 – 2023-06-09 (×8): 75 mg via ORAL
  Filled 2023-06-01 (×8): qty 1

## 2023-06-01 MED ORDER — LIDOCAINE 2% (20 MG/ML) 5 ML SYRINGE
INTRAMUSCULAR | Status: DC | PRN
  Administered 2023-06-01: 20 mg via INTRAVENOUS

## 2023-06-01 MED ORDER — MIDAZOLAM HCL 2 MG/2ML IJ SOLN
INTRAMUSCULAR | Status: AC
  Filled 2023-06-01: qty 2

## 2023-06-01 MED ORDER — PANTOPRAZOLE SODIUM 40 MG PO TBEC
40.0000 mg | DELAYED_RELEASE_TABLET | Freq: Every day | ORAL | Status: DC
  Administered 2023-06-01 – 2023-06-09 (×8): 40 mg via ORAL
  Filled 2023-06-01 (×9): qty 1

## 2023-06-01 MED ORDER — FENTANYL CITRATE (PF) 250 MCG/5ML IJ SOLN
INTRAMUSCULAR | Status: DC | PRN
  Administered 2023-06-01: 150 ug via INTRAVENOUS
  Administered 2023-06-01 (×2): 25 ug via INTRAVENOUS

## 2023-06-01 MED ORDER — ACETAMINOPHEN 650 MG RE SUPP: 325.0000 mg | RECTAL | Status: DC | PRN

## 2023-06-01 MED ORDER — ROCURONIUM BROMIDE 10 MG/ML (PF) SYRINGE
PREFILLED_SYRINGE | INTRAVENOUS | Status: AC
  Filled 2023-06-01: qty 10

## 2023-06-01 MED ORDER — PROPOFOL 10 MG/ML IV BOLUS
INTRAVENOUS | Status: AC
  Filled 2023-06-01: qty 20

## 2023-06-01 MED ORDER — MIDAZOLAM HCL 2 MG/2ML IJ SOLN: 0.5000 mg | Freq: Once | INTRAMUSCULAR | Status: DC | PRN

## 2023-06-01 MED ORDER — SODIUM CHLORIDE 0.9 % IV SOLN: INTRAVENOUS | Status: DC

## 2023-06-01 MED ORDER — VASOPRESSIN 20 UNIT/ML IV SOLN
INTRAVENOUS | Status: AC
  Filled 2023-06-01: qty 1

## 2023-06-01 MED ORDER — LABETALOL HCL 5 MG/ML IV SOLN: 10.0000 mg | INTRAVENOUS | Status: DC | PRN

## 2023-06-01 MED ORDER — MEPERIDINE HCL 25 MG/ML IJ SOLN: 6.2500 mg | INTRAMUSCULAR | Status: DC | PRN

## 2023-06-01 MED ORDER — HEPARIN 6000 UNIT IRRIGATION SOLUTION
Status: AC
  Filled 2023-06-01: qty 500

## 2023-06-01 MED ORDER — SUGAMMADEX SODIUM 200 MG/2ML IV SOLN
INTRAVENOUS | Status: DC | PRN
  Administered 2023-06-01 (×2): 200 mg via INTRAVENOUS

## 2023-06-01 MED ORDER — PROTAMINE SULFATE 10 MG/ML IV SOLN
INTRAVENOUS | Status: DC | PRN
  Administered 2023-06-01: 50 mg via INTRAVENOUS

## 2023-06-01 MED ORDER — METOPROLOL SUCCINATE ER 25 MG PO TB24
ORAL_TABLET | ORAL | Status: AC
  Administered 2023-06-01: 50 mg via ORAL
  Filled 2023-06-01: qty 2

## 2023-06-01 MED ORDER — ROCURONIUM BROMIDE 10 MG/ML (PF) SYRINGE
PREFILLED_SYRINGE | INTRAVENOUS | Status: DC | PRN
  Administered 2023-06-01: 70 mg via INTRAVENOUS
  Administered 2023-06-01: 20 mg via INTRAVENOUS
  Administered 2023-06-01: 30 mg via INTRAVENOUS

## 2023-06-01 MED ORDER — HEPARIN SODIUM (PORCINE) 5000 UNIT/ML IJ SOLN
5000.0000 [IU] | Freq: Three times a day (TID) | INTRAMUSCULAR | Status: DC
  Administered 2023-06-02 – 2023-06-05 (×10): 5000 [IU] via SUBCUTANEOUS
  Filled 2023-06-01 (×10): qty 1

## 2023-06-01 MED ORDER — OXYCODONE-ACETAMINOPHEN 5-325 MG PO TABS
1.0000 | ORAL_TABLET | ORAL | Status: DC | PRN
  Administered 2023-06-02 – 2023-06-08 (×16): 2 via ORAL
  Administered 2023-06-08 – 2023-06-09 (×3): 1 via ORAL
  Administered 2023-06-09: 2 via ORAL
  Filled 2023-06-01: qty 1
  Filled 2023-06-01 (×4): qty 2
  Filled 2023-06-01: qty 1
  Filled 2023-06-01: qty 2
  Filled 2023-06-01: qty 1
  Filled 2023-06-01 (×13): qty 2

## 2023-06-01 MED ORDER — LEVOTHYROXINE SODIUM 25 MCG PO TABS
125.0000 ug | ORAL_TABLET | Freq: Every day | ORAL | Status: DC
  Administered 2023-06-02 – 2023-06-09 (×7): 125 ug via ORAL
  Filled 2023-06-01 (×8): qty 1

## 2023-06-01 MED ORDER — ONDANSETRON HCL 4 MG/2ML IJ SOLN
INTRAMUSCULAR | Status: DC | PRN
  Administered 2023-06-01: 4 mg via INTRAVENOUS

## 2023-06-01 MED ORDER — LIDOCAINE 2% (20 MG/ML) 5 ML SYRINGE
INTRAMUSCULAR | Status: AC
  Filled 2023-06-01: qty 5

## 2023-06-01 MED ORDER — CEPHALEXIN 500 MG PO CAPS
500.0000 mg | ORAL_CAPSULE | Freq: Three times a day (TID) | ORAL | Status: DC
  Administered 2023-06-02 – 2023-06-06 (×11): 500 mg via ORAL
  Filled 2023-06-01 (×12): qty 1

## 2023-06-01 MED ORDER — DOCUSATE SODIUM 100 MG PO CAPS
100.0000 mg | ORAL_CAPSULE | Freq: Every day | ORAL | Status: DC
  Administered 2023-06-02 – 2023-06-09 (×5): 100 mg via ORAL
  Filled 2023-06-01 (×7): qty 1

## 2023-06-01 MED ORDER — ONDANSETRON HCL 4 MG/2ML IJ SOLN: 4.0000 mg | Freq: Four times a day (QID) | INTRAMUSCULAR | Status: DC | PRN

## 2023-06-01 MED ORDER — TOPIRAMATE 25 MG PO TABS
50.0000 mg | ORAL_TABLET | Freq: Two times a day (BID) | ORAL | Status: DC
  Administered 2023-06-01 – 2023-06-09 (×14): 50 mg via ORAL
  Filled 2023-06-01 (×17): qty 2

## 2023-06-01 MED ORDER — HEPARIN 6000 UNIT IRRIGATION SOLUTION
Status: DC | PRN
  Administered 2023-06-01: 1

## 2023-06-01 MED ORDER — CHLORHEXIDINE GLUCONATE 0.12 % MT SOLN
15.0000 mL | Freq: Once | OROMUCOSAL | Status: AC
  Administered 2023-06-01: 15 mL via OROMUCOSAL
  Filled 2023-06-01: qty 15

## 2023-06-01 MED ORDER — PANTOPRAZOLE SODIUM 40 MG PO TBEC: 40.0000 mg | DELAYED_RELEASE_TABLET | Freq: Every day | ORAL | Status: DC | PRN

## 2023-06-01 MED ORDER — MAGNESIUM SULFATE 2 GM/50ML IV SOLN: 2.0000 g | Freq: Every day | INTRAVENOUS | Status: DC | PRN

## 2023-06-01 MED ORDER — CLONAZEPAM 0.5 MG PO TBDP: 1.0000 mg | ORAL_TABLET | Freq: Two times a day (BID) | ORAL | Status: DC | PRN

## 2023-06-01 MED ORDER — MORPHINE SULFATE (PF) 2 MG/ML IV SOLN
2.0000 mg | INTRAVENOUS | Status: DC | PRN
  Administered 2023-06-01 – 2023-06-07 (×17): 2 mg via INTRAVENOUS
  Filled 2023-06-01 (×18): qty 1

## 2023-06-01 MED ORDER — PHENYLEPHRINE 80 MCG/ML (10ML) SYRINGE FOR IV PUSH (FOR BLOOD PRESSURE SUPPORT)
PREFILLED_SYRINGE | INTRAVENOUS | Status: AC
  Filled 2023-06-01: qty 10

## 2023-06-01 MED ORDER — CHLORHEXIDINE GLUCONATE CLOTH 2 % EX PADS: 6.0000 | MEDICATED_PAD | Freq: Once | CUTANEOUS | Status: DC

## 2023-06-01 MED ORDER — INSULIN GLARGINE-YFGN 100 UNIT/ML ~~LOC~~ SOLN
8.0000 [IU] | Freq: Two times a day (BID) | SUBCUTANEOUS | Status: DC
  Administered 2023-06-01 – 2023-06-09 (×9): 8 [IU] via SUBCUTANEOUS
  Filled 2023-06-01 (×17): qty 0.08

## 2023-06-01 MED ORDER — METOPROLOL SUCCINATE ER 50 MG PO TB24
50.0000 mg | ORAL_TABLET | Freq: Every day | ORAL | Status: DC
  Administered 2023-06-02 – 2023-06-09 (×7): 50 mg via ORAL
  Filled 2023-06-01 (×8): qty 1

## 2023-06-01 MED ORDER — ATORVASTATIN CALCIUM 40 MG PO TABS
40.0000 mg | ORAL_TABLET | Freq: Every day | ORAL | Status: DC
  Administered 2023-06-01 – 2023-06-09 (×8): 40 mg via ORAL
  Filled 2023-06-01 (×9): qty 1

## 2023-06-01 MED ORDER — GUAIFENESIN-DM 100-10 MG/5ML PO SYRP: 15.0000 mL | ORAL_SOLUTION | ORAL | Status: DC | PRN

## 2023-06-01 MED ORDER — CEFAZOLIN SODIUM-DEXTROSE 2-4 GM/100ML-% IV SOLN
2.0000 g | INTRAVENOUS | Status: AC
  Administered 2023-06-01: 2 g via INTRAVENOUS
  Filled 2023-06-01: qty 100

## 2023-06-01 MED ORDER — HYDROMORPHONE HCL 1 MG/ML IJ SOLN
0.2500 mg | INTRAMUSCULAR | Status: DC | PRN
  Administered 2023-06-01: 0.25 mg via INTRAVENOUS

## 2023-06-01 MED ORDER — OXYCODONE HCL 5 MG PO TABS: 5.0000 mg | ORAL_TABLET | Freq: Once | ORAL | Status: DC | PRN

## 2023-06-01 MED ORDER — INSULIN ASPART 100 UNIT/ML IJ SOLN: 0.0000 [IU] | INTRAMUSCULAR | Status: DC

## 2023-06-01 MED ORDER — INSULIN REGULAR(HUMAN) IN NACL 100-0.9 UT/100ML-% IV SOLN
INTRAVENOUS | Status: DC | PRN
  Administered 2023-06-01: 8 [IU]/h via INTRAVENOUS

## 2023-06-01 MED ORDER — HEPARIN SODIUM (PORCINE) 1000 UNIT/ML IJ SOLN
INTRAMUSCULAR | Status: DC | PRN
  Administered 2023-06-01: 2000 [IU] via INTRAVENOUS
  Administered 2023-06-01: 10000 [IU] via INTRAVENOUS

## 2023-06-01 MED ORDER — HYDRALAZINE HCL 20 MG/ML IJ SOLN: 5.0000 mg | INTRAMUSCULAR | Status: DC | PRN

## 2023-06-01 MED ORDER — SODIUM CHLORIDE 0.9 % IV SOLN: INTRAVENOUS | Status: DC | PRN

## 2023-06-01 MED ORDER — MIDAZOLAM HCL 2 MG/2ML IJ SOLN
INTRAMUSCULAR | Status: DC | PRN
  Administered 2023-06-01: 2 mg via INTRAVENOUS

## 2023-06-01 MED ORDER — METOPROLOL SUCCINATE ER 50 MG PO TB24: 50.0000 mg | ORAL_TABLET | Freq: Once | ORAL | Status: AC

## 2023-06-01 MED ORDER — POTASSIUM CHLORIDE CRYS ER 20 MEQ PO TBCR: 20.0000 meq | EXTENDED_RELEASE_TABLET | Freq: Every day | ORAL | Status: DC | PRN

## 2023-06-01 MED ORDER — SCOPOLAMINE 1 MG/3DAYS TD PT72
1.0000 | MEDICATED_PATCH | TRANSDERMAL | Status: DC
Start: 2023-06-01 — End: 2023-06-01
  Administered 2023-06-01: 1.5 mg via TRANSDERMAL
  Filled 2023-06-01: qty 1

## 2023-06-01 MED ORDER — ACETAMINOPHEN 500 MG PO TABS
1000.0000 mg | ORAL_TABLET | Freq: Once | ORAL | Status: AC
  Administered 2023-06-01: 1000 mg via ORAL
  Filled 2023-06-01: qty 2

## 2023-06-01 MED ORDER — METOPROLOL TARTRATE 5 MG/5ML IV SOLN: 2.0000 mg | INTRAVENOUS | Status: DC | PRN

## 2023-06-01 MED ORDER — HYDRALAZINE HCL 25 MG PO TABS
25.0000 mg | ORAL_TABLET | Freq: Two times a day (BID) | ORAL | Status: DC
  Administered 2023-06-01 – 2023-06-09 (×14): 25 mg via ORAL
  Filled 2023-06-01 (×16): qty 1

## 2023-06-01 MED ORDER — ALBUMIN HUMAN 5 % IV SOLN: INTRAVENOUS | Status: DC | PRN

## 2023-06-01 MED ORDER — DEXAMETHASONE SODIUM PHOSPHATE 10 MG/ML IJ SOLN
INTRAMUSCULAR | Status: AC
  Filled 2023-06-01: qty 1

## 2023-06-01 MED ORDER — PROPOFOL 10 MG/ML IV BOLUS
INTRAVENOUS | Status: DC | PRN
  Administered 2023-06-01: 80 mg via INTRAVENOUS

## 2023-06-01 MED ORDER — HEMOSTATIC AGENTS (NO CHARGE) OPTIME
TOPICAL | Status: DC | PRN
  Administered 2023-06-01: 1 via TOPICAL

## 2023-06-01 MED ORDER — ISOSORBIDE MONONITRATE ER 60 MG PO TB24
60.0000 mg | ORAL_TABLET | Freq: Every day | ORAL | Status: DC
  Administered 2023-06-01 – 2023-06-09 (×8): 60 mg via ORAL
  Filled 2023-06-01 (×9): qty 1

## 2023-06-01 MED ORDER — FENTANYL CITRATE (PF) 250 MCG/5ML IJ SOLN
INTRAMUSCULAR | Status: AC
  Filled 2023-06-01: qty 5

## 2023-06-01 MED ORDER — OXYCODONE HCL 5 MG/5ML PO SOLN: 5.0000 mg | Freq: Once | ORAL | Status: DC | PRN

## 2023-06-01 MED ORDER — SODIUM CHLORIDE 0.9 % IV SOLN
500.0000 mL | Freq: Once | INTRAVENOUS | Status: DC | PRN
Start: 2023-06-01 — End: 2023-06-10

## 2023-06-01 MED ORDER — DEXAMETHASONE SODIUM PHOSPHATE 10 MG/ML IJ SOLN
INTRAMUSCULAR | Status: DC | PRN
  Administered 2023-06-01: 5 mg via INTRAVENOUS

## 2023-06-01 MED ORDER — ALUM & MAG HYDROXIDE-SIMETH 200-200-20 MG/5ML PO SUSP
15.0000 mL | ORAL | Status: DC | PRN
Start: 2023-06-01 — End: 2023-06-10

## 2023-06-01 MED ORDER — INSULIN ASPART 100 UNIT/ML IJ SOLN: 0.0000 [IU] | Freq: Three times a day (TID) | INTRAMUSCULAR | Status: DC

## 2023-06-01 MED ORDER — DOXYCYCLINE HYCLATE 100 MG PO TABS
100.0000 mg | ORAL_TABLET | Freq: Two times a day (BID) | ORAL | Status: DC
  Administered 2023-06-01 – 2023-06-05 (×9): 100 mg via ORAL
  Filled 2023-06-01 (×9): qty 1

## 2023-06-01 MED ORDER — PROTAMINE SULFATE 10 MG/ML IV SOLN
INTRAVENOUS | Status: AC
  Filled 2023-06-01: qty 5

## 2023-06-01 MED ORDER — ORAL CARE MOUTH RINSE: 15.0000 mL | Freq: Once | OROMUCOSAL | Status: AC

## 2023-06-01 MED ORDER — HYDROMORPHONE HCL 1 MG/ML IJ SOLN
INTRAMUSCULAR | Status: AC
  Filled 2023-06-01: qty 1

## 2023-06-01 MED ORDER — 0.9 % SODIUM CHLORIDE (POUR BTL) OPTIME
TOPICAL | Status: DC | PRN
  Administered 2023-06-01: 1000 mL

## 2023-06-01 MED ORDER — INSULIN ASPART 100 UNIT/ML IJ SOLN: 0.0000 [IU] | Freq: Every day | INTRAMUSCULAR | Status: DC

## 2023-06-01 MED ORDER — HEPARIN SODIUM (PORCINE) 1000 UNIT/ML IJ SOLN
INTRAMUSCULAR | Status: AC
  Filled 2023-06-01: qty 20

## 2023-06-01 MED ORDER — PHENYLEPHRINE HCL-NACL 20-0.9 MG/250ML-% IV SOLN
INTRAVENOUS | Status: DC | PRN
  Administered 2023-06-01: 20 ug/min via INTRAVENOUS

## 2023-06-01 MED ORDER — ONDANSETRON HCL 4 MG/2ML IJ SOLN
INTRAMUSCULAR | Status: AC
  Filled 2023-06-01: qty 2

## 2023-06-01 MED ORDER — CEFAZOLIN SODIUM-DEXTROSE 2-4 GM/100ML-% IV SOLN
2.0000 g | Freq: Three times a day (TID) | INTRAVENOUS | Status: AC
  Administered 2023-06-01 – 2023-06-02 (×2): 2 g via INTRAVENOUS
  Filled 2023-06-01 (×2): qty 100

## 2023-06-01 MED ORDER — SODIUM CHLORIDE (PF) 0.9 % IJ SOLN
INTRAMUSCULAR | Status: AC
  Filled 2023-06-01: qty 20

## 2023-06-01 MED ORDER — PHENYLEPHRINE 80 MCG/ML (10ML) SYRINGE FOR IV PUSH (FOR BLOOD PRESSURE SUPPORT)
PREFILLED_SYRINGE | INTRAVENOUS | Status: DC | PRN
  Administered 2023-06-01 (×2): 80 ug via INTRAVENOUS
  Administered 2023-06-01: 40 ug via INTRAVENOUS
  Administered 2023-06-01: 120 ug via INTRAVENOUS
  Administered 2023-06-01: 40 ug via INTRAVENOUS
  Administered 2023-06-01 (×2): 80 ug via INTRAVENOUS

## 2023-06-01 MED ORDER — INSULIN ASPART 100 UNIT/ML IJ SOLN
0.0000 [IU] | INTRAMUSCULAR | Status: DC | PRN
  Administered 2023-06-01: 5 [IU] via SUBCUTANEOUS
  Filled 2023-06-01: qty 1

## 2023-06-01 MED ORDER — PHENOL 1.4 % MT LIQD: 1.0000 | OROMUCOSAL | Status: DC | PRN

## 2023-06-01 MED ORDER — EPHEDRINE SULFATE-NACL 50-0.9 MG/10ML-% IV SOSY
PREFILLED_SYRINGE | INTRAVENOUS | Status: DC | PRN
  Administered 2023-06-01: 5 mg via INTRAVENOUS
  Administered 2023-06-01: 10 mg via INTRAVENOUS

## 2023-06-01 MED ORDER — SODIUM CHLORIDE 0.9 % IV SOLN: INTRAVENOUS | Status: AC

## 2023-06-01 MED ORDER — SODIUM CHLORIDE 0.9 % IV SOLN: 10.0000 mL/h | Freq: Once | INTRAVENOUS | Status: DC

## 2023-06-01 SURGICAL SUPPLY — 59 items
BAG COUNTER SPONGE SURGICOUNT (BAG) ×2 IMPLANT
BANDAGE ESMARK 6X9 LF (GAUZE/BANDAGES/DRESSINGS) IMPLANT
BLADE CLIPPER SURG (BLADE) ×2 IMPLANT
BNDG ESMARK 6X9 LF (GAUZE/BANDAGES/DRESSINGS) ×1 IMPLANT
CANISTER SUCT 3000ML PPV (MISCELLANEOUS) ×2 IMPLANT
CANNULA VESSEL 3MM 2 BLNT TIP (CANNULA) IMPLANT
CATH EMB 2FR 60 (CATHETERS) IMPLANT
CLIP TI MEDIUM 24 (CLIP) ×2 IMPLANT
CLIP TI WIDE RED SMALL 24 (CLIP) ×2 IMPLANT
COVER PROBE W GEL 5X96 (DRAPES) ×2 IMPLANT
COVER SURGICAL LIGHT HANDLE (MISCELLANEOUS) IMPLANT
CUFF TOURN SGL QUICK 18X4 (TOURNIQUET CUFF) IMPLANT
CUFF TOURN SGL QUICK 42 (TOURNIQUET CUFF) IMPLANT
CUFF TRNQT CYL 24X4X16.5-23 (TOURNIQUET CUFF) IMPLANT
CUFF TRNQT CYL 34X4.125X (TOURNIQUET CUFF) IMPLANT
DERMABOND ADVANCED .7 DNX12 (GAUZE/BANDAGES/DRESSINGS) ×2 IMPLANT
DERMABOND ADVANCED .7 DNX6 (GAUZE/BANDAGES/DRESSINGS) IMPLANT
DRAIN CHANNEL 15F RND FF W/TCR (WOUND CARE) IMPLANT
DRAPE C-ARM 42X72 X-RAY (DRAPES) ×2 IMPLANT
DRAPE HALF SHEET 40X57 (DRAPES) IMPLANT
DRSG COVADERM 4X6 (GAUZE/BANDAGES/DRESSINGS) IMPLANT
ELECT BLADE 4.0 EZ CLEAN MEGAD (MISCELLANEOUS) ×1 IMPLANT
ELECT REM PT RETURN 9FT ADLT (ELECTROSURGICAL) ×2 IMPLANT
ELECTRODE BLDE 4.0 EZ CLN MEGD (MISCELLANEOUS) IMPLANT
ELECTRODE REM PT RTRN 9FT ADLT (ELECTROSURGICAL) ×2 IMPLANT
EVACUATOR SILICONE 100CC (DRAIN) IMPLANT
GLOVE BIO SURGEON STRL SZ7.5 (GLOVE) ×2 IMPLANT
GLOVE BIOGEL PI IND STRL 8 (GLOVE) ×2 IMPLANT
GOWN STRL REUS W/ TWL LRG LVL3 (GOWN DISPOSABLE) ×4 IMPLANT
GOWN STRL REUS W/ TWL XL LVL3 (GOWN DISPOSABLE) ×4 IMPLANT
HEMOSTAT SPONGE AVITENE ULTRA (HEMOSTASIS) IMPLANT
KIT BASIN OR (CUSTOM PROCEDURE TRAY) ×2 IMPLANT
KIT TURNOVER KIT B (KITS) ×2 IMPLANT
NS IRRIG 1000ML POUR BTL (IV SOLUTION) ×4 IMPLANT
PACK PERIPHERAL VASCULAR (CUSTOM PROCEDURE TRAY) ×2 IMPLANT
PAD ARMBOARD 7.5X6 YLW CONV (MISCELLANEOUS) ×4 IMPLANT
PENCIL SMOKE EVACUATOR (MISCELLANEOUS) IMPLANT
POWDER SURGICEL 3.0 GRAM (HEMOSTASIS) IMPLANT
PUNCH AORTIC ROTATE 4.0MM (MISCELLANEOUS) IMPLANT
SPONGE T-LAP 18X18 ~~LOC~~+RFID (SPONGE) IMPLANT
STOPCOCK 4 WAY LG BORE MALE ST (IV SETS) ×2 IMPLANT
SUT ETHILON 3 0 PS 1 (SUTURE) IMPLANT
SUT MNCRL AB 4-0 PS2 18 (SUTURE) ×4 IMPLANT
SUT PROLENE 5 0 C 1 24 (SUTURE) ×2 IMPLANT
SUT PROLENE 5 0 C 1 36 (SUTURE) IMPLANT
SUT PROLENE 6 0 BV (SUTURE) ×2 IMPLANT
SUT PROLENE 7 0 BV 1 (SUTURE) IMPLANT
SUT SILK 2 0 PERMA HAND 18 BK (SUTURE) IMPLANT
SUT SILK 2 0 SH (SUTURE) ×2 IMPLANT
SUT SILK 3-0 18XBRD TIE 12 (SUTURE) IMPLANT
SUT VIC AB 2-0 CT1 TAPERPNT 27 (SUTURE) ×4 IMPLANT
SUT VIC AB 3-0 SH 27X BRD (SUTURE) ×4 IMPLANT
SYR 3ML LL SCALE MARK (SYRINGE) IMPLANT
TOWEL GREEN STERILE (TOWEL DISPOSABLE) ×2 IMPLANT
TRAY FOLEY MTR SLVR 16FR STAT (SET/KITS/TRAYS/PACK) ×2 IMPLANT
TUBE CONNECTING 20X1/4 (TUBING) IMPLANT
TUBING EXTENTION W/L.L. (IV SETS) ×2 IMPLANT
UNDERPAD 30X36 HEAVY ABSORB (UNDERPADS AND DIAPERS) ×2 IMPLANT
WATER STERILE IRR 1000ML POUR (IV SOLUTION) ×2 IMPLANT

## 2023-06-01 NOTE — Telephone Encounter (Signed)
Per Dr Annamary Rummage pt needs appt 7 to 10 days.  I called pt and it went to voicemail and I was not able to leave message for pt.   I scheduled pt for 2/4 at 315. ITs 6 days but Dr Lilian Kapur is full on day 8 and day 10 is on weekend

## 2023-06-01 NOTE — Transfer of Care (Signed)
Immediate Anesthesia Transfer of Care Note  Patient: Lauren [name]  Procedure(s) Performed: RIGHT LEG BYPASS GRAFT SUPERFICIAL FEMORAL-ANTERIOR TIBIAL ARTERY WITH RIGHT NON-REVERSED GREATER SAPHENOUS VEIN (Right: Leg Upper) VEIN HARVEST OF RIGHT GREATER SAPHENOUS VEIN (Right: Leg Upper)  Patient Location: PACU  Anesthesia Type:General  Level of Consciousness: awake, drowsy, and patient cooperative  Airway & Oxygen Therapy: Patient Spontanous Breathing and Patient connected to face mask oxygen  Post-op Assessment: Report given to RN and Post -op Vital signs reviewed and stable  Post vital signs: Reviewed and stable  Last Vitals:  Vitals Value Taken Time  BP 118/79 06/01/23 1406  Temp    Pulse 86 06/01/23 1415  Resp 15 06/01/23 1415  SpO2 99 % 06/01/23 1415  Vitals shown include unfiled device data.  Last Pain:  Vitals:   06/01/23 0744  TempSrc:   PainSc: 6       Patients Stated Pain Goal: 5 (06/01/23 0744)  Complications: No notable events documented.

## 2023-06-01 NOTE — Progress Notes (Signed)
Report received from [facility].

## 2023-06-01 NOTE — Inpatient Diabetes Management (Addendum)
Inpatient Diabetes Program Recommendations  AACE/ADA: New Consensus Statement on Inpatient Glycemic Control (2015)  Target Ranges:  Prepandial:   less than 140 mg/dL      Peak postprandial:   less than 180 mg/dL (1-2 hours)      Critically ill patients:  140 - 180 mg/dL   Lab Results  Component Value Date   GLUCAP 159 (H) 06/01/2023   HGBA1C 8.7 (H) 04/22/2023    Review of Glycemic Control  Latest Reference Range & Units 06/01/23 09:09 06/01/23 10:23 06/01/23 11:19 06/01/23 12:19 06/01/23 13:17  Glucose-Capillary 70 - 99 mg/dL 161 (H) 096 (H) 045 (H) 171 (H) 159 (H)   Diabetes history: DM 1 (since age 15) Outpatient Diabetes medications:  Omnipod insulin pump Basal 12A   0.9 units/hr Total Basal: 21.6 units/day   Insulin to Carb Ratio 1:5 grams Insulin Sensitivity Factor  1:38 mg/dl Current orders for Inpatient glycemic control:  IV insulin-  Inpatient Diabetes Program Recommendations:    Note history of Type 1 DM.  Talked to RN in PACU and she states that patient is not currently wearing insulin pump but is on IV insulin- last CBG=111 mg/dL.   For transition off insulin drip, patient will need basal, bolus and meal coverage (when eating>50% of meal trays).  Consider adding Semglee 8 units bid and  Novolog very sensitive correction q 4 hours.  May d/c insulin drip 2 hours after basal insulin given.  Orders received.    Thanks,  Lorenza Cambridge, RN, BC-ADM Inpatient Diabetes Coordinator Pager [phone]  (8a-5p)

## 2023-06-01 NOTE — H&P (Signed)
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  The various methods of treatment have been discussed with the patient and family. After consideration of risks, benefits and other options for treatment, the patient has consented to  Procedure(s): RIGHT LEG BYPASS GRAFT FEMORAL-TIBIAL ARTERY (Right) as a surgical intervention.  The patient's history has been reviewed, patient examined, no change in status, stable for surgery.  I have reviewed the patient's chart and labs.  Questions were answered to the patient's satisfaction.    Plan right femoral to tibial bypass for critical limb ischemia with tissue loss in the right foot.  Angiogram showed right popliteal occlusion with mainly AT runoff.  Plan is bypass as I did not feel popliteal stenting in a 43 yo was durable.  Discussed she has marginal vein and may require PTFE which would limit durability but hopefully patent long enough to allow the foot to heal.  Risk benefits discussed including wound healing problems, infection, risk of anesthesia, MI, stroke, worsening renal function including dialysis, worsening tissue loss leading to limb loss.  She has significant comorbidities for her age.   Lauren [name] Consult       Reason for Consult:  right foot wound Requesting Physician:  Lauren [name] MRN #:  [id number]   History of Present Illness: This is a 43 y.o. female who presented to the hospital with dull chest pain.  She has hx of CHF and came to be evaluated.  She recently had a left proximal humerus fx s/p fall on 12/223/2024.  She had recent admits 04/21/23- 04/24/23 for DKA and UTI and 03/2023 with seizure, DKA, and AKI.    Pt was seen by Podiatry and MRI on 05/18/2023 with concerns for possible osteomyelitis.  She has been started on IV abx and Vascular Surgery is consulted for further  evaluation.   She states that she has had the wounds on her feet since her last admission due to swelling "since y'all pumped all that fluid in me".  She states she feels the sores are getting worse.  She continues to have swelling in her feet.  She states she feels there is more redness.  She does not recall having claudication prior to developing her sores. She states her feet hurt because they are swollen.  She states that she takes a toke of a cigarette every now and then but not every day.   She does have hx of CHF and CKD with a creatinine of 2.44 today.  Her GFR is 25.  She is on coumadin and her INR today is 2.9.   She is known to our office with hx of CO2 angiogram with left ATA, DP and SFA angioplasty and stenting of left SFA on 06/10/2022 by Dr. Chestine Spore for CLI of LLE with tissue loss.  She was noted to have significant small vessel disease.  She underwent excision of left 5th metatarsal with debridement of ulcer and 4th ray amputation for osteomyelitis by Dr. Ardelle Anton.    She has hx of acute ischemic RUE and underwent right brachial, radial, and ulnar thrombectomy on 11/18/2021 by Dr. Chestine Spore.  Cardio embolic work up did not reveal source and she was referred to hem-onc for further work up.    Per CTA head, she has stent of right ICA in the petrous segment.     She was seen  in our office on 11/30/2022 and was doing well.  She had healed her wounds and she was not having claudication or rest pain.  She was compliant with her plavix and statin.  She had not smoked since her stroke. At that time, she had a great toe pressure on the right of 90.  Her vessels were [place].  She had brisk doppler flow in both feet.  She is scheduled for f/u in our office next week.    The pt is on a statin for cholesterol management.  The pt is not on a daily aspirin.   Other AC:  Plavix/coumadin The pt is on BB, diuretic, hydralazine for hypertension.   The pt is on medication for diabetes PTA. Tobacco hx:  former        Past Medical History:  Diagnosis Date   Acute ischemic stroke (HCC) 01/01/2020   Acute kidney injury superimposed on CKD (HCC) 01/01/2020   Acute right MCA stroke (HCC) 01/28/2022   Anemia     CAD (coronary artery disease)      a. s/p cath in 03/2014 showing 30% mid-LAD, moderate to severe disease along small D1, patent LCx, moderate to severe distal OM2 stenosis and moderate diffuse diease along RCA not amenable to PCI   Cerebrovascular accident (CVA) (HCC) 12/31/2019   CHF (congestive heart failure) (HCC)      a. EF 55-60% in 12/2019 b. EF at 35-40% by echo in 05/2020   CKD (chronic kidney disease) stage 4, GFR 15-29 ml/min (HCC)     Diabetes mellitus without complication (HCC)     Myocardial infarction (HCC)     Neuropathy     PVD (peripheral vascular disease) (HCC)     Stroke (HCC) 01/2022    L hand weakness               Past Surgical History:  Procedure Laterality Date   ABDOMINAL AORTOGRAM W/LOWER EXTREMITY N/A 06/10/2022    Procedure: ABDOMINAL AORTOGRAM W/LOWER EXTREMITY;  Surgeon: Cephus Shelling, MD;  Location: [facility];  Service: Cardiovascular;  Laterality: N/A;   AMPUTATION Left 09/02/2021    Procedure: AMPUTATION RAY;  Surgeon: Edwin Cap, DPM;  Location: [facility] OR;  Service: Podiatry;  Laterality: Left;  sagittal saw, 3L bag saline & Pulse   Cardiac catherization       CHOLECYSTECTOMY       I & D EXTREMITY Left 08/30/2021    Procedure: IRRIGATION AND DEBRIDEMENT WITH BONE BIOPSY;  Surgeon: Candelaria Stagers, DPM;  Location: [facility] OR;  Service: Podiatry;  Laterality: Left;   IR ANGIO VERTEBRAL SEL SUBCLAVIAN INNOMINATE UNI L MOD SED   01/18/2022   IR CT HEAD LTD   01/08/2022   IR INTRA CRAN STENT   01/08/2022   IR PERCUTANEOUS ART THROMBECTOMY/INFUSION INTRACRANIAL INC DIAG ANGIO   01/08/2022   IRRIGATION AND DEBRIDEMENT FOOT Left 09/04/2021    Procedure: IRRIGATION AND DEBRIDEMENT FOOT AND CLOSURE;  Surgeon: Edwin Cap, DPM;  Location: [facility];   Service: Podiatry;  Laterality: Left;   LEFT HEART CATH AND CORONARY ANGIOGRAPHY N/A 07/26/2022    Procedure: LEFT HEART CATH AND CORONARY ANGIOGRAPHY;  Surgeon: [place], Peter M, MD;  Location: [facility] [place] INV[facility] CV LAB;  Service: Cardiovascular;  Laterality: N/A;   METATARSAL OSTEOTOMY Left 07/21/2022    Procedure: LEFT FOOT EXCISION OF FIFTH METATARSAL WITH DEBRIDEMENT OF ULCER, POSSIBLE BONE BIOPSY OF THE FOURTH METATARSAL, POSSIBLE FOURTH RAY AMPUTATION OF LEFT TOEAND METATARSAL;  Surgeon: Ardelle Anton,  Lesia Sago, DPM;  Location: [facility];  Service: Podiatry;  Laterality: Left;   PERIPHERAL VASCULAR BALLOON ANGIOPLASTY Left 06/10/2022    Procedure: PERIPHERAL VASCULAR BALLOON ANGIOPLASTY;  Surgeon: Cephus Shelling, MD;  Location: [facility];  Service: Cardiovascular;  Laterality: Left;  AT and DP   PERIPHERAL VASCULAR INTERVENTION Left 06/10/2022    Procedure: PERIPHERAL VASCULAR INTERVENTION;  Surgeon: Cephus Shelling, MD;  Location: [facility] INVASIVE CV LAB;  Service: Cardiovascular;  Laterality: Left;  SFA   RADIOLOGY WITH ANESTHESIA N/A 01/08/2022    Procedure: IR WITH ANESTHESIA;  Surgeon: Radiologist, Medication, MD;  Location: [facility];  Service: Radiology;  Laterality: N/A;   THROMBECTOMY BRACHIAL ARTERY Right 11/18/2021    Procedure: RIGHT BRACHIAL, RADIAL, & ULNAR ARTERY THROMBECTOMY.;  Surgeon: Cephus Shelling, MD;  Location: [facility];  Service: Vascular;  Laterality: Right;   TUBAL LIGATION              Allergies       Allergies  Allergen Reactions   Visipaque [Iodixanol] Nausea And Vomiting      Patient had vagal response during procedure became hypertensive, and bradycardic. CO2 used during procedure prior to contrast being used, this may be cause as well. Recommended premedicating prior to contrast being used in the future.    Wellbutrin [Bupropion] Hives   Gabapentin        dizzy   Ciprofloxacin Hcl Hives and Rash      Hives/rash at injection site; 01/15/22 tolerated IV  cipro   Tape Rash               Prior to Admission medications   Medication Sig Start Date End Date Taking? Authorizing Provider  acetaminophen (TYLENOL) 325 MG tablet Take 1-2 tablets (325-650 mg total) by mouth every 4 (four) hours as needed for mild pain. Patient taking differently: Take 650 mg by mouth as needed for mild pain (pain score 1-3). 02/18/22   Yes Love, Evlyn Kanner, PA-C  atorvastatin (LIPITOR) 40 MG tablet TAKE 1 TABLET BY MOUTH ONCE DAILY. 01/25/23   Yes Billie Lade, MD  clonazePAM (KLONOPIN) 1 MG disintegrating tablet Take 1 tablet (1 mg total) by mouth 2 (two) times daily as needed for seizure (For seizure lasting more than 2 minutes.). 05/16/23   Yes Billie Lade, MD  clopidogrel (PLAVIX) 75 MG tablet Take 1 tablet (75 mg total) by mouth daily. 05/16/23   Yes Billie Lade, MD  furosemide (LASIX) 40 MG tablet TAKE 1 TABLET BY MOUTH ONCE DAILY AS NEEDED FOR SWELLING *PATIENT NEEDS APPOINTMENT* 05/13/23   Yes Branch, Dorothe Pea, MD  hydrALAZINE (APRESOLINE) 25 MG tablet Take 1 tablet (25 mg total) by mouth in the morning and at bedtime. 04/24/23   Yes Vassie Loll, MD  insulin aspart (NOVOLOG) 100 UNIT/ML injection Use with Omnipod for daily dose around 100 units daily Patient taking differently: Inject 0-200 Units into the skin See admin instructions. Load Omnipod with 200 units every 3-4 days. 10/18/22   Yes Reardon, Freddi Starr, NP  isosorbide mononitrate (IMDUR) 60 MG 24 hr tablet TAKE TWO (2) TABLETS BY MOUTH EVERY DAY *NEW PRESCRIPTION REQUEST* 05/12/23   Yes Billie Lade, MD  levothyroxine (SYNTHROID) 125 MCG tablet Take 1 tablet (125 mcg total) by mouth daily at 6 (six) AM. 06/17/22 06/12/23 Yes Dani Gobble, NP  metoprolol succinate (TOPROL-XL) 50 MG 24 hr tablet Take 1 tablet (50 mg total) by mouth daily. Take with or immediately following  a meal. 05/03/23 08/01/23 Yes Carollee Herter, DO  nitroGLYCERIN (NITROSTAT) 0.4 MG SL tablet Place 1 tablet (0.4 mg total)  under the tongue every 5 (five) minutes as needed for chest pain. 05/16/23   Yes Billie Lade, MD  oxyCODONE-acetaminophen (PERCOCET) 10-325 MG tablet Take 1 tablet by mouth every 4 (four) hours as needed for pain. 05/12/23   Yes Vickki Hearing, MD  pantoprazole (PROTONIX) 40 MG tablet Take 1 tablet (40 mg total) by mouth daily. Courtesy fill/ pt to get established with a primary MD for further refills Patient taking differently: Take 40 mg by mouth daily as needed (heartburn). 05/16/23 11/12/23 Yes Billie Lade, MD  ranolazine (RANEXA) 1000 MG SR tablet TAKE ONE (1) TABLET BY MOUTH TWICE DAILY *NEW PRESCRIPTION REQUEST* 05/12/23   Yes Billie Lade, MD  senna-docusate (SENOKOT-S) 8.6-50 MG tablet Take 1-2 tablets by mouth daily as needed for mild constipation. 05/16/23   Yes Billie Lade, MD  sodium bicarbonate 650 MG tablet Take 1 tablet (650 mg total) by mouth 2 (two) times daily. 05/16/23 06/15/23 Yes Billie Lade, MD  tiZANidine (ZANAFLEX) 4 MG tablet TAKE 2 TABLETS BY MOUTH ONCE DAILY AT BEDTIME 05/16/23   Yes Billie Lade, MD  topiramate (TOPAMAX) 50 MG tablet Take 1 tablet (50 mg total) by mouth 2 (two) times daily. 03/16/23   Yes Emokpae, Courage, MD  traZODone (DESYREL) 100 MG tablet TAKE 1 TABLET BY MOUTH AT BEDTIME *NEW PRESCRIPTION REQUEST* 05/12/23   Yes Billie Lade, MD  warfarin (COUMADIN) 5 MG tablet TAKE ONE TABLET BY MOUTH DAILY EXCEPT 1/2 TABLET ON WEDNESDAYS AND SATURDAYS OR AS DIRECTED Patient taking differently: Take 2.5-5 mg by mouth daily. Take 5mg  (1 tablet) daily, except on Tuesday, Thursday, and Saturday's take 2.5mg  (1/2 tablet). 02/14/23   Yes Branch, Dorothe Pea, MD  Continuous Glucose Sensor (DEXCOM G6 SENSOR) MISC CHANGE SENSOR EVERY 10 DAYS. 05/16/23     Dani Gobble, NP  Continuous Glucose Transmitter (DEXCOM G6 TRANSMITTER) MISC Change transmitter every 90 days as directed. 01/20/23     Roma Kayser, MD  Insulin Disposable Pump (OMNIPOD  5 DEXG7G6 PODS GEN 5) MISC Change pod every 48-72 hours. 11/11/22     Dani Gobble, NP      Social History         Socioeconomic History   Marital status: Married      Spouse name: Harvel Quale   Number of children: 2   Years of education: 12   Highest education level: 12th grade  Occupational History      Comment: Full time   Occupation: CNA at [employer]      Comment: applying for disability  Tobacco Use   Smoking status: Former      Current packs/day: 0.00      Average packs/day: 1 pack/day for 15.0 years (15.0 ttl pk-yrs)      Types: Cigarettes      Start date: 01/2007      Quit date: 01/2022      Years since quitting: 1.3      Passive exposure: Past   Smokeless tobacco: Never  Vaping Use   Vaping status: Never Used  Substance and Sexual Activity   Alcohol use: Yes      Comment: occ   Drug use: No   Sexual activity: Yes      Birth control/protection: Surgical      Comment: tubal   Other Topics Concern  Not on file  Social History Narrative    Lives with husband and kids    Right handed    Drinks 9+ cups caffeine daily    Social Drivers of Health        Financial Resource Strain: Low Risk  (11/09/2022)    Overall Financial Resource Strain (CARDIA)     Difficulty of Paying Living Expenses: Not very hard  Food Insecurity: Food Insecurity Present (05/21/2023)    Hunger Vital Sign     Worried About Running Out of Food in the Last Year: Often true     Ran Out of Food in the Last Year: Often true  Transportation Needs: Unmet Transportation Needs (05/18/2023)    PRAPARE - Therapist, art (Medical): Yes     Lack of Transportation (Non-Medical): Yes  Physical Activity: Inactive (03/03/2022)    Exercise Vital Sign     Days of Exercise per Week: 0 days     Minutes of Exercise per Session: 0 min  Stress: No Stress Concern Present (03/03/2022)    [employer] of Occupational Health - Occupational Stress Questionnaire     Feeling of Stress :  Only a little  Recent Concern: Stress - Stress Concern Present (12/22/2021)    [employer] of Occupational Health - Occupational Stress Questionnaire     Feeling of Stress : To some extent  Social Connections: Socially Integrated (03/03/2022)    Social Connection and Isolation Panel [NHANES]     Frequency of Communication with Friends and Family: More than three times a week     Frequency of Social Gatherings with Friends and Family: More than three times a week     Attends Religious Services: More than 4 times per year     Active Member of [employer] or Organizations: Yes     Attends Banker Meetings: More than 4 times per year     Marital Status: Married  Catering manager Violence: Not At Risk (05/18/2023)    Humiliation, Afraid, Rape, and Kick questionnaire     Fear of Current or Ex-Partner: No     Emotionally Abused: No     Physically Abused: No     Sexually Abused: No             Family History  Problem Relation Age of Onset   Thyroid disease Mother     Hypertension Mother     Hyperlipidemia Mother     CAD Mother     CVA Mother     Hyperlipidemia Father     Hypertension Father     CAD Father     Breast cancer Paternal Grandmother 77   Cancer Paternal Grandmother          lung          ROS: [x]  Positive   [ ]  Negative   [ ]  All sytems reviewed and are negative   Cardiac: [x]  chest pain/pressure [x]  CAD with elevated troponin consistent with demand ischemia [x]  heart failure with EF 30-35%    Vascular: [x]  non-healing ulcers [x]  swelling in legs   Pulmonary: []  asthma/wheezing []  home O2   Neurologic: [x]  hx of CVA with left hemiparesis      Hematologic: [x]  anemia   Endocrine:   [x]  diabetes []  thyroid disease   GI []  GERD   GU: [x]  CKD/renal failure []  HD--[]  M/W/F or []  T/T/S   Psychiatric: []  anxiety []  depression   Musculoskeletal: [x]  left humerus fx  December 2024   Integumentary:  [x]  ulcers, redness right foot    Constitutional: []  fever    Physical Examination       Vitals:    05/22/23 2058 05/23/23 0256  BP: (!) 120/54 139/88  Pulse: 80 95  Resp: 18 18  Temp: (!) 97.5 F (36.4 C) 97.8 F (36.6 C)  SpO2: 100% 100%    Body mass index is 35.07 kg/m.   General:  WDWN in NAD Gait: Not observed HENT: WNL, normocephalic Pulmonary: normal non-labored breathing Cardiac: regular Abdomen: obese Skin: without rashes Vascular Exam/Pulses: Right brisk AT/PT doppler flow; monophasic DP out onto the foot Left brisk PT doppler flow Pt sitting in chair and unable to examine femoral pulses.  Extremities: BLE swelling  Musculoskeletal: no muscle wasting or atrophy           Neurologic: A&O X 3 Psychiatric:  The pt has  flat  affect.     CBC Labs (Brief)          Component Value Date/Time    WBC 7.7 05/23/2023 0625    RBC 2.71 (L) 05/23/2023 0625    HGB 7.4 (L) 05/23/2023 0625    HGB 12.3 02/18/2023 1032    HCT 24.1 (L) 05/23/2023 0625    HCT 38.4 02/18/2023 1032    PLT 330 05/23/2023 0625    PLT 347 02/18/2023 1032    MCV 88.9 05/23/2023 0625    MCV 93 02/18/2023 1032    MCH 27.3 05/23/2023 0625    MCHC 30.7 05/23/2023 0625    RDW 17.1 (H) 05/23/2023 0625    RDW 14.0 02/18/2023 1032    LYMPHSABS 1.9 05/17/2023 1745    LYMPHSABS 1.9 08/11/2022 1510    MONOABS 0.7 05/17/2023 1745    EOSABS 0.2 05/17/2023 1745    EOSABS 0.1 08/11/2022 1510    BASOSABS 0.0 05/17/2023 1745    BASOSABS 0.1 08/11/2022 1510        BMET Labs (Brief)          Component Value Date/Time    NA 139 05/23/2023 0625    NA 140 08/11/2022 1511    K 4.0 05/23/2023 0625    CL 107 05/23/2023 0625    CO2 20 (L) 05/23/2023 0625    GLUCOSE 268 (H) 05/23/2023 0625    BUN 23 (H) 05/23/2023 0625    BUN 37 (H) 08/11/2022 1511    CREATININE 2.44 (H) 05/23/2023 0625    CALCIUM 8.1 (L) 05/23/2023 0625    GFRNONAA 25 (L) 05/23/2023 0625    GFRAA 31 (L) 02/01/2020 1328        COAGS: Recent Labs        Lab Results  Component Value Date    INR 2.9 (H) 05/23/2023    INR 2.4 (H) 05/22/2023    INR 2.6 (H) 05/22/2023          Non-Invasive Vascular Imaging:   ABI's/TBI's on 11/30/2022: Right:  [place]/0.56 - Great toe pressure: 90 Left:  [place]/0.55 - Great toe pressure: 89   Arterial duplex on 11/30/2022: +-----------+--------+-----+---------------+----------+----------+  LEFT      PSV cm/sRatioStenosis       Waveform  Comments    +-----------+--------+-----+---------------+----------+----------+  EIA Distal 102                         triphasic             +-----------+--------+-----+---------------+----------+----------+  CFA Prox   86  triphasic             +-----------+--------+-----+---------------+----------+----------+  CFA Distal 83                          triphasic             +-----------+--------+-----+---------------+----------+----------+  DFA       88                          biphasic              +-----------+--------+-----+---------------+----------+----------+  SFA Prox   149                         triphasic             +-----------+--------+-----+---------------+----------+----------+  SFA Mid    126                         triphasic stent area  +-----------+--------+-----+---------------+----------+----------+  SFA Distal 158          30-49% stenosistriphasic             +-----------+--------+-----+---------------+----------+----------+  POP Prox   112                         triphasic             +-----------+--------+-----+---------------+----------+----------+  POP Mid    66                          triphasic             +-----------+--------+-----+---------------+----------+----------+  POP Distal 68                          triphasic             +-----------+--------+-----+---------------+----------+----------+  ATA Distal 50                           monophasic            +-----------+--------+-----+---------------+----------+----------+  PTA Distal 18                          monophasic            +-----------+--------+-----+---------------+----------+----------+  PERO Distal29                          monophasic            +-----------+--------+-----+---------------+----------+----------+  Left Stent(s):  +---------------+---++---------++  Prox to Stent  151triphasic  +---------------+---++---------++  Proximal Stent 148triphasic  +---------------+---++---------++  Mid Stent      139triphasic  +---------------+---++---------++  Distal Stent   71 triphasic  +---------------+---++---------++  Distal to Stent84 triphasic  +---------------+---++---------++  Summary:  Left: Patent left SFA stent with no visualized stenosis.      Previous ABI's/TBI's on 07/20/2022: Right:  0.94/0.67 - Great toe pressure: 88 Left:  0.88/[place] - Great toe pressure:  [place]       ASSESSMENT/PLAN: This is a 43 y.o. female with right foot ulcerations and has hx of CKD, CHF.     She has a vascular  hx of CO2 angiogram with left ATA, DP and SFA angioplasty  and stenting of left SFA on 06/10/2022 by Dr. Chestine Spore for CLI of LLE with tissue loss.  She was noted to have significant small vessel disease.  She underwent excision of left 5th metatarsal with debridement of ulcer and 4th ray amputation for osteomyelitis by Dr. Ardelle Anton.  She is scheduled with our office on 1/28 for ABI and LLE arterial duplex.  Will order this while she is hospitalized.   She has hx of acute ischemic RUE and underwent right brachial, radial, and ulnar thrombectomy on 11/18/2021 by Dr. Chestine Spore.  Cardio embolic work up did not reveal source and she was referred to hem-onc for further work up.      -pt with new right ulcerations and possible osteomyelitis.  She does have hx of CKD with creatinine today of 2.44. she most likely will need angiogram of RLE  with CO2.  She is on coumadin and her INR today 2.9.   I was not able to evaluate her femoral pulses as she is sitting in the chair.     -Dr. Hetty Blend to evaluate pt and determine further plan   Discussed with her that given her co morbidities and hx of smoking, she is at risk of amputation but will need more information about her arterial flow.     Doreatha Massed, PA-C [facility] [phone]       VASCULAR STAFF ADDENDUM: I have independently interviewed and examined the patient. I agree with the above.  Ms. Dales is a 27-year-old female who underwent revascularization with left AT, DP and SFA angioplasty with stenting of the left SFA in February 2024.  Her left toe amputation sites have since healed but she has been pain-free.  She now presents with right foot wounds with osteomyelitis. Plan to obtain ABI and left lower extremity duplex to to assess the previously treated segments of the left leg as she has a follow-up scheduled with [name] for next week which we will cancel. ABI on the right was previously noncompressible with a toe pressure of 89 in March 2024.  I explained that if her current perfusion is stable from then she has marginal wound healing capabilities and would likely benefit from an angiogram. -Will follow-up ABI and left leg duplex -Potentially plan for right lower extremity angiogram pending vascular testing -Please hold Coumadin, can be bridged with heparin if needed   Daria Pastures MD [facility] Phone Number: [phone] 05/23/2023 1:33 PM

## 2023-06-01 NOTE — Anesthesia Procedure Notes (Signed)
Procedure Name: Intubation Date/Time: 06/01/2023 10:00 AM  Performed by: Yolonda Kida, CRNAPre-anesthesia Checklist: Patient identified, Emergency Drugs available, Suction available and Patient being monitored Patient Re-evaluated:Patient Re-evaluated prior to induction Oxygen Delivery Method: Circle System Utilized Preoxygenation: Pre-oxygenation with 100% oxygen Induction Type: IV induction Ventilation: Mask ventilation without difficulty Laryngoscope Size: Mac and 3 Grade View: Grade I Tube type: Oral Tube size: 7.0 mm Number of attempts: 1 Airway Equipment and Method: Stylet Placement Confirmation: ETT inserted through vocal cords under direct vision, positive ETCO2 and breath sounds checked- equal and bilateral Secured at: 22 cm Tube secured with: Tape Dental Injury: Teeth and Oropharynx as per pre-operative assessment

## 2023-06-01 NOTE — Anesthesia Postprocedure Evaluation (Signed)
Anesthesia Post Note  Patient: Lauren [name]  Procedure(s) Performed: RIGHT LEG BYPASS GRAFT SUPERFICIAL FEMORAL-ANTERIOR TIBIAL ARTERY WITH RIGHT NON-REVERSED GREATER SAPHENOUS VEIN (Right: Leg Upper) VEIN HARVEST OF RIGHT GREATER SAPHENOUS VEIN (Right: Leg Upper)     Patient location during evaluation: PACU Anesthesia Type: General Level of consciousness: sedated, patient cooperative and oriented Pain management: pain level controlled Vital Signs Assessment: post-procedure vital signs reviewed and stable Respiratory status: spontaneous breathing, nonlabored ventilation, respiratory function stable and patient connected to nasal cannula oxygen Cardiovascular status: blood pressure returned to baseline and stable Postop Assessment: no apparent nausea or vomiting Anesthetic complications: no   No notable events documented.  Last Vitals:  Vitals:   06/01/23 1445 06/01/23 1500  BP: 118/75 116/80  Pulse: 86 88  Resp: 14 13  Temp:    SpO2: 98% 95%    Last Pain:  Vitals:   06/01/23 1500  TempSrc:   PainSc: Asleep                 Karol Liendo,E. Cecila Satcher

## 2023-06-01 NOTE — Anesthesia Procedure Notes (Signed)
Arterial Line Insertion Start/End1/29/2025 8:38 AM, 06/01/2023 8:49 AM Performed by: Jairo Ben, MD, anesthesiologist  Patient location: Pre-op. Preanesthetic checklist: patient identified, IV checked, risks and benefits discussed, surgical consent, monitors and equipment checked, pre-op evaluation, timeout performed and anesthesia consent Lidocaine 1% used for infiltration Right, radial was placed Catheter size: 20 G Hand hygiene performed  and maximum sterile barriers used   Attempts: 1 Procedure performed using ultrasound guided technique. Ultrasound Notes:anatomy identified, needle tip was noted to be adjacent to the nerve/plexus identified, no ultrasound evidence of intravascular and/or intraneural injection and image(s) printed for medical record Following insertion, dressing applied and Biopatch. Post procedure assessment: normal  Patient tolerated the procedure well with no immediate complications.

## 2023-06-01 NOTE — Op Note (Signed)
Date: June 01, 2023  Preoperative diagnosis: Critical limb ischemia of right lower extremity with tissue loss  Postoperative diagnosis: Same  Procedure: 1.  Harvest of right leg great saphenous vein 2.  Right superficial femoral artery to anterior tibial artery bypass with ipsilateral nonreversed great saphenous vein  Surgeon: Dr. Cephus Shelling, MD  Assistant: Dr. Elna Breslow, MD and Aggie Moats, [place]  Indications: 43 year old female with multiple risk factors including diabetes that was seen with the right lower extremity tissue loss.  She underwent angiogram showing a right popliteal artery occlusion with dominant runoff in the anterior tibial.  Her vein mapping shows a very short segment of saphenous vein that looks adequate.  She presents for right lower extremity bypass after risks benefits discussed.  An assistant was needed given the complexity of the case and for harvesting vein and sewing the anastomosis.  Findings: The right great saphenous vein was harvested from the saphenofemoral junction down to the proximal calf with three skip incisions.  This did dilate and looked better than vein mapping suggested.  Ultimately I only had enough vein to come off the SFA in the mid thigh in end to side fashion and this was tunneled through the subcutaneous tissue lateral to the anterior tibial and sewn end to side.  Brisk dorsalis pedis signal in the foot at completion with a palpable pulse in the bypass graft.  Anesthesia: General  Details: Patient was taken to the operating room after informed consent was obtained.  Placed on operative table in the supine position.  General endotracheal anesthesia was induced.  I then used ultrasound and marked out the SFA in the mid thigh where I thought we could use this for inflow given we only had shorter segment of vein available.  I did mark out the great saphenous vein in the thigh into the proximal calf and this got very small in the proximal  calf where it branched.  Ultimately the right groin and right leg were prepped and draped in standard sterile fashion.  Antibiotics were given and timeout performed.  I then made 3 skip incisions in the right thigh into the calf and we harvested the great saphenous vein and all side branches were ligated between 3-0 silk ties and divided.  This was harvested from the saphenofemoral junction all the way down to the proximal calf where it branched and became too small for use.  We then transected this over right angle clamp distally and the saphenofemoral femoral junction was oversewn with a 5-0 Prolene.  The vein was then dilated and I placed a vessel cannula.  This did dilate nicely.  I had to repair several areas with 6-0 Prolene's.  I went ahead and expose the SFA in the proximal to mid thigh through a separate longitudinal incision dissected out the SFA and this was controlled with Vesseloops.  The artery was calcified but we were able to find a soft area for anastomosis.  Dr. Lenell Antu worked below the knee and made a longitudinal incision on the lateral calf and ultimately the anterior tibial artery was dissected out and exposed between the anterior and lateral compartments.  This was quite calcified circumferentially.  At that point in time patient was given 100 units per kilogram IV heparin.  I did check a ACT to ensure it was greater than 250.  Prior to heparin I did tunnel from the anterior tibial exposure laterally up to the mid SFA in the subcutaneous space with a large Gore tunneler.  The vein was brought on the field in nonreversed fashion and spatulated.  We clamped the SFA with Vesseloops and open this with Potts scissors.  I had used Henley clamps given we still hade pulsatile inflow.  I did use aortic punch.  The vein was spatulated and end to side anastomosis was sewn to the right SFA with 5-0 Prolene parachute technique.  We had no flow in the bypass because this was nonreversed and then used a  valvulotome and carefully lysed all the valves and we had good pulsatile flow distally and the vein dilated nicely.  I put 2 medium clips distally and then marked the vein for orientation.  It was then attached to the tunneler and I carefully tunneled this laterally to the anterior tibial target.  We had good one-to-one tension.  I then used the tourniquet on the upper thigh and exsanguinated the leg with an Esmarch.  We went up to 250 mmHg and then 300 mm Hg  and still had bleeding in the anterior tibial given this was calcified.  I had to pass a #2 Fogarty down in the anterior tibial to get distal control.  The vein distally was spatulated and end to side anastomosis was sewn with 6-0 Prolene to the anterior tibial parachute technique with the help of my assistant.  This was de-aired prior to completion.  I did have to put several additional repair sutures in the distal anastomosis with 6-0 Prolene.  We had a brisk AT and DP signal in the foot.  Protamine was given for reversal.  All the incisions were irrigated out and the vein harvest incisions were closed with 2-0 Vicryl, 3-0 Vicryl, 4-0 Monocryl and Dermabond.  The SFA exposure was closed with 2-0 Vicryl, 3-0 Vicryl, 4-0 Monocryl Dermabond.  Distally the anastomosis and put Surgicel powder and a closed this loosely with 3-0 nylon's.  Awakened from anesthesia and taken to recovery in stable condition.  Complication: None  Condition: Stable  Cephus Shelling, MD [facility] [place] Office: [phone]   Cephus Shelling

## 2023-06-02 ENCOUNTER — Encounter (HOSPITAL_COMMUNITY): Payer: Self-pay | Admitting: Vascular Surgery

## 2023-06-02 LAB — GLUCOSE, CAPILLARY
Glucose-Capillary: 155 mg/dL — ABNORMAL HIGH (ref 70–99)
Glucose-Capillary: 103 mg/dL — ABNORMAL HIGH (ref 70–99)
Glucose-Capillary: 81 mg/dL (ref 70–99)
Glucose-Capillary: 152 mg/dL — ABNORMAL HIGH (ref 70–99)
Glucose-Capillary: 140 mg/dL — ABNORMAL HIGH (ref 70–99)

## 2023-06-02 LAB — BASIC METABOLIC PANEL
Anion gap: 12 (ref 5–15)
BUN: 18 mg/dL (ref 6–20)
CO2: 19 mmol/L — ABNORMAL LOW (ref 22–32)
Calcium: 7.4 mg/dL — ABNORMAL LOW (ref 8.9–10.3)
Chloride: 108 mmol/L (ref 98–111)
Creatinine, Ser: 2.3 mg/dL — ABNORMAL HIGH (ref 0.44–1.00)
GFR, Estimated: 27 mL/min — ABNORMAL LOW (ref >60)
Glucose, Bld: 157 mg/dL — ABNORMAL HIGH (ref 70–99)
Potassium: 3.8 mmol/L (ref 3.5–5.1)
Sodium: 139 mmol/L (ref 135–145)

## 2023-06-02 LAB — TYPE AND SCREEN
ABO/RH(D): A POS
Antibody Screen: POSITIVE
Donor AG Type: NEGATIVE
Donor AG Type: NEGATIVE
Unit division: 0
Unit division: 0

## 2023-06-02 LAB — POCT I-STAT 7, (LYTES, BLD GAS, ICA,H+H)
Acid-base deficit: 7 mmol/L — ABNORMAL HIGH (ref 0.0–2.0)
Bicarbonate: 18.8 mmol/L — ABNORMAL LOW (ref 20.0–28.0)
Calcium, Ion: 1.12 mmol/L — ABNORMAL LOW (ref 1.15–1.40)
HCT: 25 % — ABNORMAL LOW (ref 36.0–46.0)
Hemoglobin: 8.5 g/dL — ABNORMAL LOW (ref 12.0–15.0)
O2 Saturation: 99 %
Potassium: 4.1 mmol/L (ref 3.5–5.1)
Sodium: 141 mmol/L (ref 135–145)
TCO2: 20 mmol/L — ABNORMAL LOW (ref 22–32)
pCO2 arterial: 39.7 mm[Hg] (ref 32–48)
pH, Arterial: 7.284 — ABNORMAL LOW (ref 7.35–7.45)
pO2, Arterial: 139 mm[Hg] — ABNORMAL HIGH (ref 83–108)

## 2023-06-02 LAB — CBC
HCT: 25 % — ABNORMAL LOW (ref 36.0–46.0)
Hemoglobin: 8 g/dL — ABNORMAL LOW (ref 12.0–15.0)
MCH: 28.6 pg (ref 26.0–34.0)
MCHC: 32 g/dL (ref 30.0–36.0)
MCV: 89.3 fL (ref 80.0–100.0)
Platelets: 211 10*3/uL (ref 150–400)
RBC: 2.8 MIL/uL — ABNORMAL LOW (ref 3.87–5.11)
RDW: 16.3 % — ABNORMAL HIGH (ref 11.5–15.5)
WBC: 12.6 10*3/uL — ABNORMAL HIGH (ref 4.0–10.5)
nRBC: 0.2 % (ref 0.0–0.2)

## 2023-06-02 LAB — BPAM RBC
Blood Product Expiration Date: 202502072359
Blood Product Expiration Date: 202502112359
ISSUE DATE / TIME: 202501291215
ISSUE DATE / TIME: 202501291215
Unit Type and Rh: 5100
Unit Type and Rh: 6200

## 2023-06-02 MED ORDER — INSULIN ASPART 100 UNIT/ML IJ SOLN
0.0000 [IU] | INTRAMUSCULAR | Status: DC
  Administered 2023-06-02 (×2): 3 [IU] via SUBCUTANEOUS
  Administered 2023-06-02: 2 [IU] via SUBCUTANEOUS
  Administered 2023-06-02: 3 [IU] via SUBCUTANEOUS
  Administered 2023-06-03 – 2023-06-04 (×2): 2 [IU] via SUBCUTANEOUS
  Administered 2023-06-04: 0 [IU] via SUBCUTANEOUS
  Administered 2023-06-04 (×2): 3 [IU] via SUBCUTANEOUS
  Administered 2023-06-05 – 2023-06-06 (×2): 2 [IU] via SUBCUTANEOUS
  Administered 2023-06-07: 3 [IU] via SUBCUTANEOUS
  Administered 2023-06-08 (×2): 2 [IU] via SUBCUTANEOUS
  Administered 2023-06-08: 8 [IU] via SUBCUTANEOUS
  Administered 2023-06-09: 3 [IU] via SUBCUTANEOUS

## 2023-06-02 NOTE — Progress Notes (Signed)
Pt's Q4  sliding scale changed to moderate scale, and AC and HS coverage d/ced per DR. Hawken's order. Recent CBG 180. Received order for diet. Plan of care continues.

## 2023-06-02 NOTE — Progress Notes (Signed)
Patient woke up screaming I can't breath. RR 24, o2 sat 100% in RA. vitals stable. PT was anxious, and confused. Pt had forgot she already had surgery,  had incontinent episode of bowel. Reoriented patient , bed alarm on. Call bell within reach, floor mat placed. Plan of care continues.

## 2023-06-02 NOTE — Evaluation (Signed)
Occupational Therapy Evaluation Patient Details Name: Lauren [name] MRN: [id number] DOB: [birth year] Today's Date: 06/02/2023   History of Present Illness Pt is a 43 y/o female admitted for scheduled R LE SFA to AT bypass on 1/29 in setting of critical limb ischemia. Recent admission 1/14-1/25 due to AKI (pt left AMA). PMH: DM1, seizures, CVA, CKD stage 4, NSTEMI, recent left proximal humerus fx after fall 12/23.   Clinical Impression   PTA, pt lives with mother in handicapped accessible apartment. Pt reports using wheelchair for mobility but able to transfer self without assist. Pt reports able to manage most ADLs with intermittent assist. Pt presents now with deficits in cognition, R groin pain, standing balance, strength and endurance. Contacted ortho tech for L UE sling prior to OOB activity. Pt requires up to Max A for ADLs, +2 for safety in standing and Mod A x 2 for basic transfers. Pt reports mother unable to provide a lot of physical assist due to her own health issues so recommend consideration of continued inpatient follow up therapy, <3 hours/day at DC.      If plan is discharge home, recommend the following: A lot of help with walking and/or transfers;Two people to help with walking and/or transfers;A lot of help with bathing/dressing/bathroom;Two people to help with bathing/dressing/bathroom;Direct supervision/assist for medications management;Direct supervision/assist for financial management    Functional Status Assessment  Patient has had a recent decline in their functional status and demonstrates the ability to make significant improvements in function in a reasonable and predictable amount of time.  Equipment Recommendations  None recommended by OT    Recommendations for Other Services       Precautions / Restrictions Precautions Precautions: Fall;Other (comment) Precaution Comments: L UE hemiparesis Required Braces or Orthoses: Sling;Other Brace Other Brace: post op  shoe Restrictions Weight Bearing Restrictions Per Provider Order: Yes LUE Weight Bearing Per Provider Order: Non weight bearing RLE Weight Bearing Per Provider Order: Weight bearing as tolerated      Mobility Bed Mobility Overal bed mobility: Needs Assistance Bed Mobility: Supine to Sit     Supine to sit: Mod assist, HOB elevated, Used rails     General bed mobility comments: assist to scoot bottom to EOB using bed pads    Transfers Overall transfer level: Needs assistance Equipment used: 1 person hand held assist, 2 person hand held assist Transfers: Bed to chair/wheelchair/BSC, Sit to/from Stand Sit to Stand: Mod assist, +2 safety/equipment, +2 physical assistance     Step pivot transfers: Mod assist, +2 physical assistance, +2 safety/equipment     General transfer comment: Mod A x 2 to stand from bedside, cues to step to/from [facility] then pt requesting to sit in chair. Mod A x 2 to stand from bedside and Mod A x 1 to pivot to recliner though second person present for safety      Balance Overall balance assessment: Needs assistance Sitting-balance support: No upper extremity supported, Feet supported Sitting balance-Leahy Scale: Fair     Standing balance support: Single extremity supported, During functional activity Standing balance-Leahy Scale: Poor                             ADL either performed or assessed with clinical judgement   ADL Overall ADL's : Needs assistance/impaired Eating/Feeding: Set up;Sitting   Grooming: Minimal assistance;Sitting   Upper Body Bathing: Maximal assistance;Sitting   Lower Body Bathing: Sitting/lateral leans;Sit to/from stand;+2 for physical assistance;+2 for safety/equipment;Moderate  assistance   Upper Body Dressing : Maximal assistance;Sitting   Lower Body Dressing: Maximal assistance;Sitting/lateral leans;Sit to/from stand;+2 for physical assistance;+2 for safety/equipment   Toilet Transfer: Moderate assistance;+2  for physical assistance;+2 for safety/equipment;Stand-pivot Toilet Transfer Details (indicate cue type and reason): cues for wrappingRUE around therapist elbow to stand and cues for step sequencing to/from [facility]. Toileting- Clothing Manipulation and Hygiene: Moderate assistance;Sitting/lateral lean;Sit to/from stand;+2 for physical assistance;+2 for safety/equipment               Vision Baseline Vision/History: 1 Wears glasses Ability to See in Adequate Light: 2 Moderately impaired Patient Visual Report: No change from baseline Vision Assessment?: Vision impaired- to be further tested in functional context Additional Comments: to be further assessed, reported unable to read items on whiteboard and some difficulty seeing buttons on tv remote.     Perception         Praxis         Pertinent Vitals/Pain Pain Assessment Pain Assessment: Faces Faces Pain Scale: Hurts even more Pain Location: R groin Pain Descriptors / Indicators: Grimacing, Guarding Pain Intervention(s): Monitored during session, Limited activity within patient's tolerance     Extremity/Trunk Assessment Upper Extremity Assessment Upper Extremity Assessment: Right hand dominant;LUE deficits/detail LUE Deficits / Details: repositioned sling, noted clenched fist w/ baseline hemiparesis. catch and release to extend digits out w/ discomfort noted. pt reports previously having a brace but has not been wearing due to it being uncomfortable. encouraged pt to have family bring it in if she is to stay in hospital for extended time LUE Coordination: decreased gross motor   Lower Extremity Assessment Lower Extremity Assessment: Defer to PT evaluation   Cervical / Trunk Assessment Cervical / Trunk Assessment: Normal   Communication Communication Communication: No apparent difficulties Cueing Techniques: Verbal cues;Gestural cues;Tactile cues   Cognition Arousal: Alert Behavior During Therapy: Lability Overall Cognitive  Status: No family/caregiver present to determine baseline cognitive functioning                                 General Comments: follows commands, cries intermittently, confusion noted and questionable hallucinations as pt asking what the cup is on her table but was actually pointing to internet cord from computer further back.     General Comments  Contacted ortho tech prior to session to request sling as pt did not have hers here. Also further discussions with staff if post op shoe needed as no orders or mention noted in surgeon's notes    Exercises     Shoulder Instructions      Home Living Family/patient expects to be discharged to:: Private residence Living Arrangements: Parent Available Help at Discharge: Family;Available 24 hours/day (mother who is unable to provide heavy physical assistance) Type of Home: Apartment Home Access: Level entry     Home Layout: One level     Bathroom Shower/Tub: Producer, television/film/video: Handicapped height Bathroom Accessibility: Yes   Home Equipment: BSC/3in1;Tub bench;Rolling Walker (2 wheels);Wheelchair - manual;Grab bars - tub/shower;Shower seat          Prior Functioning/Environment Prior Level of Function : Needs assist             Mobility Comments: reports now pivoting to/from wheelchair and using wheelchair for mobility per HHPT recommendations to not walk without assist ADLs Comments: pt reports her mom assists as she can with ADLs. pt reports able to transfer to toilet and  toilet self without assist        OT Problem List: Decreased strength;Decreased range of motion;Decreased activity tolerance;Impaired balance (sitting and/or standing);Decreased coordination;Decreased safety awareness;Decreased knowledge of use of DME or AE;Decreased cognition;Impaired sensation;Impaired UE functional use;Pain      OT Treatment/Interventions: Self-care/ADL training;Therapeutic exercise;Energy conservation;DME  and/or AE instruction;Therapeutic activities;Cognitive remediation/compensation;Patient/family education;Balance training    OT Goals(Current goals can be found in the care plan section) Acute Rehab OT Goals Patient Stated Goal: pain control OT Goal Formulation: With patient Time For Goal Achievement: 06/16/23 Potential to Achieve Goals: Good ADL Goals Pt Will Perform Lower Body Bathing: with min assist;sit to/from stand;sitting/lateral leans Pt Will Transfer to Toilet: with contact guard assist;stand pivot transfer;bedside commode Pt Will Perform Toileting - Clothing Manipulation and hygiene: with min assist;sit to/from stand;sitting/lateral leans Additional ADL Goal #1: Pt to manage UE sling with Modified [place]  OT Frequency: Min 1X/week    Co-evaluation              AM-PAC OT "6 Clicks" Daily Activity     Outcome Measure Help from another person eating meals?: A Little Help from another person taking care of personal grooming?: A Little Help from another person toileting, which includes using toliet, bedpan, or urinal?: A Lot Help from another person bathing (including washing, rinsing, drying)?: A Lot Help from another person to put on and taking off regular upper body clothing?: A Lot Help from another person to put on and taking off regular lower body clothing?: A Lot 6 Click Score: 14   End of Session Equipment Utilized During Treatment: Gait belt;Other (comment) (sling) Nurse Communication: Mobility status  Activity Tolerance: Patient limited by pain Patient left: in chair;with call bell/phone within reach;with chair alarm set  OT Visit Diagnosis: Unsteadiness on feet (R26.81);Other abnormalities of gait and mobility (R26.89);Muscle weakness (generalized) (M62.81)                Time: 1610-9604 OT Time Calculation (min): 45 min Charges:  OT General Charges $OT Visit: 1 Visit OT Evaluation $OT Eval Moderate Complexity: 1 Mod OT Treatments $Self Care/Home  Management : 8-22 mins $Therapeutic Activity: 8-22 mins  Bradd Canary, OTR/L Acute Rehab Services Office: [phone]   Lorre Munroe 06/02/2023, 2:42 PM

## 2023-06-02 NOTE — Progress Notes (Addendum)
Patient sitting in chair comfortable with daughter present. Two Rns transferred patient to bed to perform bladder scan. Patient bladder scanned with urine. MD notified. Verbal order placed for in and out cath per MD Clark.   Patient returned back to bed with transfer help of two Rns.  Kenard Gower, RN

## 2023-06-02 NOTE — Evaluation (Signed)
Physical Therapy Evaluation Patient Details Name: Lauren [name] MRN: [id number] DOB: 02-10-1981 Today's Date: 06/02/2023  History of Present Illness  Lauren [name] is a 43 y.o. female admitted 06/01/23 with RLE critical limb ischemia and tissue loss. Pt underwent right SFA to AT bypass graft. 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   Clinical Impression  Pt admitted with diagnosis above. PTA, pt was residing with her mother in a handicap accessible apartment. She primarily utilized a manual wheelchair for mobility and received intermittent assistance from family with ADLs and transportation. Pt needed maxA x2 to scoot to edge of recliner chair, modA x2 for STS and gait using HHA. Pt experienced emotional lability during the session and required re-direction and re-orientation to task. Pt currently with functional limitations due to the deficits listed below (see PT Problem List). Pt will benefit from acute skilled PT to increase her independence and safety with mobility to allow discharge. Pt does not have adequate support to safely discharge home due to the level of physical assistance she currently requires with functional mobility. Recommend  post-acute rehab, < 3 hours/day.        If plan is discharge home, recommend the following: Help with stairs or ramp for entrance;Assistance with cooking/housework;A lot of help with walking and/or transfers;Assist for transportation   Can travel by private vehicle   No    Equipment Recommendations Other (comment) (TBD at next venue)  Recommendations for Other Services       Functional Status Assessment Patient has had a recent decline in their functional status and demonstrates the  ability to make significant improvements in function in a reasonable and predictable amount of time.     Precautions / Restrictions Precautions Precautions: Fall Required Braces or Orthoses: Sling Restrictions Weight Bearing Restrictions Per Provider Order: Yes LUE Weight Bearing Per Provider Order: Non weight bearing RLE Weight Bearing Per Provider Order: Weight bearing as tolerated      Mobility  Bed Mobility               General bed mobility comments: Not observed. Pt greeted in recliner chair.    Transfers Overall transfer level: Needs assistance Equipment used: 2 person hand held assist Transfers: Sit to/from Stand Sit to Stand: Mod assist, +2 safety/equipment, +2 physical assistance           General transfer comment: Pt reporting inability to scoot, maxA x2 using bed pad to bring her to edge of chair. Cued pt to increase fwd lean and use momentum to help power up. ModA x2 to achieve standing. Good eccentric control with sitting.    Ambulation/Gait Ambulation/Gait assistance: Mod assist, +2 safety/equipment, +2 physical assistance Gait Distance (Feet): 12 Feet (Pt ambulated from recliner chair to wall and back turning around to continue to face fwd) Assistive device: 2 person hand held assist Gait Pattern/deviations: Step-to pattern, Decreased stance time - right, Decreased step length - left   Gait velocity interpretation: <1.31 ft/sec, indicative of household ambulator   General Gait Details: Pt took very slow, unequal steps, with limited foot clearence. Pausing multiple times d/t pain and crying intermittently. Utilized distraction methods to help continue engagement with activity. Pt displayed difficulty with turning, c/o her foot getting stuck which could be d/t grippy socks.  Stairs  Wheelchair Mobility     Tilt Bed    Modified Rankin (Stroke Patients Only)       Balance Overall balance assessment: Needs  assistance Sitting-balance support: Single extremity supported, Feet supported Sitting balance-Leahy Scale: Fair Sitting balance - Comments: Pt seated in recliner chair with back support. Required maxA x2 to scoot to edge using bed pad. Maintained neutral posture with RUE on handrail prior to standing.   Standing balance support: Single extremity supported Standing balance-Leahy Scale: Poor Standing balance comment: Pt required modA x2 for gait, HHA provided on RUE, and support at trunk on L given WB restriction.                             Pertinent Vitals/Pain Pain Assessment Pain Assessment: 0-10 Pain Score: 5  Pain Location: RLE Pain Descriptors / Indicators: Moaning, Grimacing, Tender, Tightness Pain Intervention(s): Monitored during session, Premedicated before session    Home Living Family/patient expects to be discharged to:: Private residence Living Arrangements: Parent Available Help at Discharge: Family;Available 24 hours/day (Mother has chronic conditions and wouldn't be able) Type of Home: Apartment Home Access: Level entry       Home Layout: One level Home Equipment: BSC/3in1;Tub bench;Rolling Walker (2 wheels);Wheelchair - manual;Grab bars - tub/shower;Shower seat;Cane - single point      Prior Function Prior Level of Function : Needs assist       Physical Assist : ADLs (physical)   ADLs (physical): Bathing Mobility Comments: Pt reports primarily using a manual wheelchair, propelling it with her legs and R arm while in and out of the home. ADLs Comments: Pt reports her daughter and sister come over twice a week to help with bathing as they're able. Pt reports sheis able to dress herself, attend to bathroom needs, and no longer drives. Pt's mother helps manage meals and laundry.     Extremity/Trunk Assessment   Upper Extremity Assessment Upper Extremity Assessment: Defer to OT evaluation    Lower Extremity Assessment Lower Extremity  Assessment: Overall WFL for tasks assessed    Cervical / Trunk Assessment Cervical / Trunk Assessment: Normal  Communication   Communication Communication: No apparent difficulties Cueing Techniques: Verbal cues;Tactile cues  Cognition Arousal: Alert Behavior During Therapy: Lability Overall Cognitive Status: No family/caregiver present to determine baseline cognitive functioning                                 General Comments: Pt A,Ox4. She follows commands with increased time and displayed emotional lability requiring re-direction and re-orientation to task.        General Comments General comments (skin integrity, edema, etc.): Pt greeted with sling donned on LUE. No post-op shoe present in room. VSS on RA.    Exercises General Exercises - Lower Extremity Ankle Circles/Pumps: Seated, Left, AROM, 15 reps Long Arc Quad: Seated, Left, AROM, 15 reps Toe Raises: Seated, Both, AROM, 15 reps Heel Raises: Seated, Both, AROM, 15 reps   Assessment/Plan    PT Assessment Patient needs continued PT services  PT Problem List Decreased activity tolerance;Decreased balance;Decreased mobility;Decreased cognition;Decreased knowledge of use of DME;Decreased safety awareness;Pain       PT Treatment Interventions DME instruction;Functional mobility training;Gait training;Therapeutic activities;Balance training;Therapeutic exercise;Patient/family education;Wheelchair mobility training    PT Goals (Current goals can be found in the Care Plan section)  Acute Rehab PT Goals Patient Stated Goal: Regain independence and  move out of my mom's house. PT Goal Formulation: With patient Time For Goal Achievement: 06/16/23 Potential to Achieve Goals: Good Additional Goals Additional Goal #1: Pt will propel wheelchair 75ft with modI.    Frequency Min 1X/week     Co-evaluation               AM-PAC PT "6 Clicks" Mobility  Outcome Measure Help needed turning from your back to  your side while in a flat bed without using bedrails?: A Lot Help needed moving from lying on your back to sitting on the side of a flat bed without using bedrails?: A Lot Help needed moving to and from a bed to a chair (including a wheelchair)?: Total Help needed standing up from a chair using your arms (e.g., wheelchair or bedside chair)?: Total Help needed to walk in hospital room?: Total Help needed climbing 3-5 steps with a railing? : Total 6 Click Score: 8    End of Session Equipment Utilized During Treatment: Gait belt;Other (comment) (LUE sling) Activity Tolerance: Patient limited by pain Patient left: in chair;with chair alarm set;with call bell/phone within reach Nurse Communication: Mobility status PT Visit Diagnosis: Unsteadiness on feet (R26.81);Other abnormalities of gait and mobility (R26.89);Difficulty in walking, not elsewhere classified (R26.2);Pain Pain - Right/Left: Right Pain - part of body: Leg    Time: 1436-1500 PT Time Calculation (min) (ACUTE ONLY): 24 min   Charges:   PT Evaluation $PT Eval Moderate Complexity: 1 Mod   PT General Charges $$ ACUTE PT VISIT: 1 Visit         Cheri Guppy, PT, DPT Acute Rehabilitation Services Office: [phone] Secure Chat Preferred   Richardson Chiquito 06/02/2023, 4:17 PM

## 2023-06-02 NOTE — Progress Notes (Addendum)
  Progress Note    06/02/2023 7:34 AM 1 Day Post-Op  Subjective:  no complaints   Vitals:   06/02/23 0402 06/02/23 0600  BP: 126/79 122/71  Pulse: 86   Resp: 20 18  Temp: 98.2 F (36.8 C) 98.4 F (36.9 C)  SpO2: 99%    Physical Exam: Lungs:  non labored Incisions:  incisions of thigh c/d/I; lower leg incision  Extremities:  brisk DP by doppler; palpable bypass pulse lateral knee Neurologic:  A&O  CBC    Component Value Date/Time   WBC 12.6 (H) 06/02/2023 0430   RBC 2.80 (L) 06/02/2023 0430   HGB 8.0 (L) 06/02/2023 0430   HGB 12.3 02/18/2023 1032   HCT 25.0 (L) 06/02/2023 0430   HCT 38.4 02/18/2023 1032   PLT 211 06/02/2023 0430   PLT 347 02/18/2023 1032   MCV 89.3 06/02/2023 0430   MCV 93 02/18/2023 1032   MCH 28.6 06/02/2023 0430   MCHC 32.0 06/02/2023 0430   RDW 16.3 (H) 06/02/2023 0430   RDW 14.0 02/18/2023 1032   LYMPHSABS 1.9 05/17/2023 1745   LYMPHSABS 1.9 08/11/2022 1510   MONOABS 0.7 05/17/2023 1745   EOSABS 0.2 05/17/2023 1745   EOSABS 0.1 08/11/2022 1510   BASOSABS 0.0 05/17/2023 1745   BASOSABS 0.1 08/11/2022 1510    BMET    Component Value Date/Time   NA 139 06/02/2023 0430   NA 140 08/11/2022 1511   K 3.8 06/02/2023 0430   CL 108 06/02/2023 0430   CO2 19 (L) 06/02/2023 0430   GLUCOSE 157 (H) 06/02/2023 0430   BUN 18 06/02/2023 0430   BUN 37 (H) 08/11/2022 1511   CREATININE 2.30 (H) 06/02/2023 0430   CALCIUM 7.4 (L) 06/02/2023 0430   GFRNONAA 27 (L) 06/02/2023 0430   GFRAA 31 (L) 02/01/2020 1328    INR    Component Value Date/Time   INR 1.2 06/01/2023 0752   INR 3.4 09/30/2022 0911     Intake/Output Summary (Last 24 hours) at 06/02/2023 0734 Last data filed at 06/02/2023 0600 Gross per 24 hour  Intake 3205 ml  Output 1000 ml  Net 2205 ml     Assessment/Plan:  43 y.o. female is s/p R SFA to ATA bypass with vein 1 Day Post-Op   R foot well perfused with brisk DP by doppler; palpable bypass pulse at the lateral  knee Incisions are well appearing without drainage or bleeding ABL anemia: Hgb 8 this morning after 2u pRBCs intraoperatively; recheck CBC tomorrow Plavix restarted; She will also need coumadin restarted prior to d/c home OOB with therapy teams today  Previously, patient has had DNR status.  We discussed this during exam this morning and she would like to be a full code.   Emilie Rutter, PA-C [facility] [phone] 06/02/2023 7:34 AM  I have seen and evaluated the patient. I agree with the PA note as documented above.  Postop day 1 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 for critical limb ischemia with tissue loss.  Palpable pulse in bypass graft and palpable DP pulse in the foot correlated with very brisk triphasic Doppler signal.  Out of bed and mobilize today.  Hemoglobin 8 after 2 units PRBCs in the OR.  Preop hemoglobin 7.  She has chronic CKD with creatinine around 2.3 today near her baseline.  Out of bed and mobilize.  Cephus Shelling, MD [facility] [place] Office: [phone]

## 2023-06-02 NOTE — Plan of Care (Signed)
  Problem: Education: Goal: Knowledge of General Education information will improve Description: Including pain rating scale, medication(s)/side effects and non-pharmacologic comfort measures Outcome: Progressing   Problem: Health Behavior/Discharge Planning: Goal: Ability to manage health-related needs will improve Outcome: Progressing   Problem: Clinical Measurements: Goal: Ability to maintain clinical measurements within normal limits will improve Outcome: Progressing Goal: Will remain free from infection Outcome: Progressing Goal: Diagnostic test results will improve Outcome: Progressing Goal: Respiratory complications will improve Outcome: Progressing Goal: Cardiovascular complication will be avoided Outcome: Progressing   Problem: Activity: Goal: Risk for activity intolerance will decrease Outcome: Progressing   Problem: Nutrition: Goal: Adequate nutrition will be maintained Outcome: Progressing   Problem: Coping: Goal: Level of anxiety will decrease Outcome: Progressing   Problem: Elimination: Goal: Will not experience complications related to bowel motility Outcome: Progressing Goal: Will not experience complications related to urinary retention Outcome: Progressing   Problem: Pain Managment: Goal: General experience of comfort will improve and/or be controlled Outcome: Progressing   Problem: Safety: Goal: Ability to remain free from injury will improve Outcome: Progressing   Problem: Skin Integrity: Goal: Risk for impaired skin integrity will decrease Outcome: Progressing   Problem: Education: Goal: Ability to describe self-care measures that may prevent or decrease complications (Diabetes Survival Skills Education) will improve Outcome: Progressing Goal: Individualized Educational Video(s) Outcome: Progressing   Problem: Coping: Goal: Ability to adjust to condition or change in health will improve Outcome: Progressing   Problem: Fluid  Volume: Goal: Ability to maintain a balanced intake and output will improve Outcome: Progressing   Problem: Health Behavior/Discharge Planning: Goal: Ability to identify and utilize available resources and services will improve Outcome: Progressing Goal: Ability to manage health-related needs will improve Outcome: Progressing   Problem: Metabolic: Goal: Ability to maintain appropriate glucose levels will improve Outcome: Progressing   Problem: Nutritional: Goal: Maintenance of adequate nutrition will improve Outcome: Progressing Goal: Progress toward achieving an optimal weight will improve Outcome: Progressing   Problem: Skin Integrity: Goal: Risk for impaired skin integrity will decrease Outcome: Progressing   Problem: Tissue Perfusion: Goal: Adequacy of tissue perfusion will improve Outcome: Progressing   Problem: Education: Goal: Knowledge of prescribed regimen will improve Outcome: Progressing   Problem: Activity: Goal: Ability to tolerate increased activity will improve Outcome: Progressing   Problem: Bowel/Gastric: Goal: Gastrointestinal status for postoperative course will improve Outcome: Progressing   Problem: Clinical Measurements: Goal: Postoperative complications will be avoided or minimized Outcome: Progressing Goal: Signs and symptoms of graft occlusion will improve Outcome: Progressing   Problem: Skin Integrity: Goal: Demonstration of wound healing without infection will improve Outcome: Progressing

## 2023-06-02 NOTE — Progress Notes (Signed)
Patient came up in insulin drip ,endo tool. Drip was running at 0.6 units/hr. Per PACU RN report, last blood sugar check was at 1926. There was no active insulin drip ordered. When I checked the MAR , insulin drip  last documented was at 1415. Order had expired at 06/01/23 at 1416. When I called back in PACU, The nurse had already left for the day, talked to change RN .  This RN paged DR. Hawken, notified of the situation. Patient's current finger stick blood sugar was 112. Pt had received Semglee 8 units at 1557, MD made aware of that. Sliding scale already ordered. Insulin drip turned off at 2040 per DR. Hawken's order. Plan of care continues.

## 2023-06-02 NOTE — Inpatient Diabetes Management (Signed)
Inpatient Diabetes Program Recommendations  AACE/ADA: New Consensus Statement on Inpatient Glycemic Control (2015)  Target Ranges:  Prepandial:   less than 140 mg/dL      Peak postprandial:   less than 180 mg/dL (1-2 hours)      Critically ill patients:  140 - 180 mg/dL   Lab Results  Component Value Date   GLUCAP 103 (H) 06/02/2023   HGBA1C 8.7 (H) 04/22/2023    Review of Glycemic Control  Latest Reference Range & Units 06/01/23 23:39 06/02/23 04:04 06/02/23 08:03  Glucose-Capillary 70 - 99 mg/dL 295 (H) 284 (H) 132 (H)   Diabetes history: DM 1 Outpatient Diabetes medications:  Omnipod insulin pump Basal 12A   0.9 units/hr Total Basal: 21.6 units/day   Insulin to Carb Ratio 1:5 grams Insulin Sensitivity Factor  1:38 mg/dl Current orders for Inpatient glycemic control:  Novolog 0-15 units q 4 hours Semglee 8 units bid  Inpatient Diabetes Program Recommendations:    Spoke with patient.  Insulin pump is off.  Recommend reducing Novolog correction to sensitive 0-9 units q 4 hours.  May need addition of Novolog meal coverage when eating better due to history of Type 1 DM.   - she states that she wants to make sure that basal insulin is held on day of discharge so she can reapply insulin pump once home.  Will follow.  Thanks,  Lorenza Cambridge, RN, BC-ADM Inpatient Diabetes Coordinator Pager [phone]

## 2023-06-02 NOTE — Progress Notes (Signed)
Orthopedic Tech Progress Note Patient Details:  Lauren [name] 1980-12-15 [id number]  Ortho Devices Type of Ortho Device: Arm sling Ortho Device/Splint Location: LUE Ortho Device/Splint Interventions: Ordered, Application   Post Interventions Patient Tolerated: Well  Lovett Calender 06/02/2023, 9:37 AM

## 2023-06-02 NOTE — Progress Notes (Signed)
Patient received from PACU via bed. Pt slightly drowsy. AO x 3. Vitals stable, chg bath completed. Connected to tele and CCMD notified. Oriented pt to room and call bell system. Call bell within reach. Plan of care continues.

## 2023-06-03 ENCOUNTER — Other Ambulatory Visit (HOSPITAL_COMMUNITY): Payer: Self-pay

## 2023-06-03 LAB — CBC
HCT: 26.8 % — ABNORMAL LOW (ref 36.0–46.0)
Hemoglobin: 8.7 g/dL — ABNORMAL LOW (ref 12.0–15.0)
MCH: 28.9 pg (ref 26.0–34.0)
MCHC: 32.5 g/dL (ref 30.0–36.0)
MCV: 89 fL (ref 80.0–100.0)
Platelets: 241 10*3/uL (ref 150–400)
RBC: 3.01 MIL/uL — ABNORMAL LOW (ref 3.87–5.11)
RDW: 17.1 % — ABNORMAL HIGH (ref 11.5–15.5)
WBC: 14.5 10*3/uL — ABNORMAL HIGH (ref 4.0–10.5)
nRBC: 0.6 % — ABNORMAL HIGH (ref 0.0–0.2)

## 2023-06-03 LAB — GLUCOSE, CAPILLARY
Glucose-Capillary: 113 mg/dL — ABNORMAL HIGH (ref 70–99)
Glucose-Capillary: 114 mg/dL — ABNORMAL HIGH (ref 70–99)
Glucose-Capillary: 134 mg/dL — ABNORMAL HIGH (ref 70–99)
Glucose-Capillary: 171 mg/dL — ABNORMAL HIGH (ref 70–99)
Glucose-Capillary: 30 mg/dL — CL (ref 70–99)
Glucose-Capillary: 42 mg/dL — CL (ref 70–99)
Glucose-Capillary: 89 mg/dL (ref 70–99)
Glucose-Capillary: 95 mg/dL (ref 70–99)

## 2023-06-03 LAB — BASIC METABOLIC PANEL
Anion gap: 9 (ref 5–15)
BUN: 18 mg/dL (ref 6–20)
CO2: 20 mmol/L — ABNORMAL LOW (ref 22–32)
Calcium: 7.7 mg/dL — ABNORMAL LOW (ref 8.9–10.3)
Chloride: 107 mmol/L (ref 98–111)
Creatinine, Ser: 2.42 mg/dL — ABNORMAL HIGH (ref 0.44–1.00)
GFR, Estimated: 25 mL/min — ABNORMAL LOW (ref >60)
Glucose, Bld: 93 mg/dL (ref 70–99)
Potassium: 3.7 mmol/L (ref 3.5–5.1)
Sodium: 136 mmol/L (ref 135–145)

## 2023-06-03 MED ORDER — ALPRAZOLAM 0.25 MG PO TABS
0.2500 mg | ORAL_TABLET | Freq: Two times a day (BID) | ORAL | Status: DC | PRN
  Administered 2023-06-04 – 2023-06-08 (×9): 0.25 mg via ORAL
  Filled 2023-06-03 (×9): qty 1

## 2023-06-03 MED ORDER — NYSTATIN 100000 UNIT/GM EX CREA
TOPICAL_CREAM | Freq: Two times a day (BID) | CUTANEOUS | Status: DC
  Administered 2023-06-03: 3 via TOPICAL
  Filled 2023-06-03: qty 30

## 2023-06-03 MED ORDER — DEXTROSE 50 % IV SOLN
INTRAVENOUS | Status: AC
  Administered 2023-06-03: 25 mL
  Filled 2023-06-03: qty 50

## 2023-06-03 NOTE — Progress Notes (Signed)
  Progress Note    06/03/2023 5:54 PM 2 Days Post-Op  Asked by RN to come look and RLE lateral incision due to oozing this afternoon Dry dressings were applied but saturated. Wound with some SS drainage from around bandage. Dressings removed and slow stream of SS drainage from proximal incision line otherwise incision intact. RLE edematous so dry 4 x 4s and ACE wrap with light compression applied to leg. RN also asked me to evaluate redness and itchy skin underneath patients breast. Looks like candida. Nystatin cream ordered. Patient also having chest pain when in room. Suspect this is more anxiety driven. NSR on tele. RN will continue to monitor. Alprazolam PRN available    Graceann Congress, PA-C [facility] [phone] 06/03/2023 5:54 PM

## 2023-06-03 NOTE — Consult Note (Signed)
[facility] Liaison Consult Note   06/03/2023  Lauren [name] 1980/11/20 [id number]  [facility] Patient:  Active with [facility] RN Care Coordinator prior to admission  Hospital Liaison review for patient admitted to [facility] rounding on unit. Visitor at bedside. Spoke via phone, she had been reached by [facility] TOC RN endorses PCP and would like to get information on food resources.   Primary Care Provider:  Billie Lade, MD with [facility], this provider is listed to provide the community transition of care follow up and [facility] calls  Insurance: [employer]   Patient was active with [facility] for care coordination services.  Patient has been engaged by a [employer].   The community based plan of care has focused on disease management and community resource support.    Plan: Continue to follow for any additional community care coordination needs for post hospital/community needs. Patient with ongoing medical management, will follow. Also, follow up with [facility] RN CC to check for future appointments.  Patient was recently offered the 30 day [facility] TOC program but decline.   Of note, [facility] services does not replace or interfere with any services that are needed or arranged by inpatient [facility] care management team.   Charlesetta Shanks, RN, BSN, CCM [facility], [facility] Liaison Direct Dial: [phone] or secure chat Email: [email or web address]

## 2023-06-03 NOTE — Progress Notes (Addendum)
Mobility Specialist Progress Note:   06/03/23 1507  Mobility  Activity Stood at bedside  Level of Assistance Minimal assist, patient does 75% or more  Assistive Device Other (Comment) (HHA)  Distance Ambulated (ft) 2 ft  LUE Weight Bearing Per Provider Order NWB  RLE Weight Bearing Per Provider Order WBAT  Activity Response Tolerated well  Mobility Referral Yes  Mobility visit 1 Mobility  Mobility Specialist Start Time (ACUTE ONLY) 1500  Mobility Specialist Stop Time (ACUTE ONLY) 1507  Mobility Specialist Time Calculation (min) (ACUTE ONLY) 7 min   Pt called out for help, requesting to get out of chair but not return to bed. Able to stand at chair side and take a few steps. Noticed pt's RLE was bleeding. Returned pt back to chair w/o fault. Deferred further mobility at this time. Notified RN. Left with call bell in reach, RN in room.   Feliciana Rossetti Mobility Specialist Please contact via Special educational needs teacher or  Rehab office at [phone]

## 2023-06-03 NOTE — Progress Notes (Addendum)
Mobility Specialist Progress Note:   06/03/23 1250  Mobility  Activity Transferred to/from [facility]  Level of Assistance Minimal assist, patient does 75% or more  Assistive Device Front wheel walker  Distance Ambulated (ft) 3 ft  LUE Weight Bearing Per Provider Order NWB  RLE Weight Bearing Per Provider Order WBAT  Activity Response Tolerated well  Mobility Referral Yes  Mobility visit 1 Mobility  Mobility Specialist Start Time (ACUTE ONLY) 1240  Mobility Specialist Stop Time (ACUTE ONLY) 1245  Mobility Specialist Time Calculation (min) (ACUTE ONLY) 5 min   Pt received in chair requesting assistance to transfer to [facility]. Guest in room, available to offer assistance. MinA to stand and pivot. Tolerated well, c/o RLE pain. RLE WBAT, LUE in sling, NWB. Call bell in reach to transfer back to bed. RN notified. All needs met.    Feliciana Rossetti Mobility Specialist Please contact via Special educational needs teacher or  Rehab office at [phone]

## 2023-06-03 NOTE — Progress Notes (Addendum)
Progress Note    06/03/2023 7:34 AM 2 Days Post-Op  Subjective:  says her leg " hurts like hell". Has ambulated in room   Vitals:   06/03/23 0345 06/03/23 0433  BP: 126/73 116/69  Pulse: 90 86  Resp: 20 20  Temp: 97.9 F (36.6 C) 98.5 F (36.9 C)  SpO2: 96% 100%   Physical Exam: Cardiac:  regular Lungs:  non labored Incisions:  Right groin, right thigh and lower leg incisions are all intact and well appearing Extremities:  brisk doppler DP signal, faint PT. Palpable pulse in bypass on lateral right knee Neurologic: alert and oriented  CBC    Component Value Date/Time   WBC 14.5 (H) 06/03/2023 0317   RBC 3.01 (L) 06/03/2023 0317   HGB 8.7 (L) 06/03/2023 0317   HGB 12.3 02/18/2023 1032   HCT 26.8 (L) 06/03/2023 0317   HCT 38.4 02/18/2023 1032   PLT 241 06/03/2023 0317   PLT 347 02/18/2023 1032   MCV 89.0 06/03/2023 0317   MCV 93 02/18/2023 1032   MCH 28.9 06/03/2023 0317   MCHC 32.5 06/03/2023 0317   RDW 17.1 (H) 06/03/2023 0317   RDW 14.0 02/18/2023 1032   LYMPHSABS 1.9 05/17/2023 1745   LYMPHSABS 1.9 08/11/2022 1510   MONOABS 0.7 05/17/2023 1745   EOSABS 0.2 05/17/2023 1745   EOSABS 0.1 08/11/2022 1510   BASOSABS 0.0 05/17/2023 1745   BASOSABS 0.1 08/11/2022 1510    BMET    Component Value Date/Time   NA 136 06/03/2023 0317   NA 140 08/11/2022 1511   K 3.7 06/03/2023 0317   CL 107 06/03/2023 0317   CO2 20 (L) 06/03/2023 0317   GLUCOSE 93 06/03/2023 0317   BUN 18 06/03/2023 0317   BUN 37 (H) 08/11/2022 1511   CREATININE 2.42 (H) 06/03/2023 0317   CALCIUM 7.7 (L) 06/03/2023 0317   GFRNONAA 25 (L) 06/03/2023 0317   GFRAA 31 (L) 02/01/2020 1328    INR    Component Value Date/Time   INR 1.2 06/01/2023 0752   INR 3.4 09/30/2022 0911     Intake/Output Summary (Last 24 hours) at 06/03/2023 0734 Last data filed at 06/03/2023 0636 Gross per 24 hour  Intake 300 ml  Output 500 ml  Net -200 ml     Assessment/Plan:  43 y.o. female is s/p  R SFA  to ATA bypass with vein  2 Days Post-Op   - RLE remains well perfused and warm with brisk doppler signals. Palpable pulse in bypass graft - RLE Incisions are intact and well appearing - Pain control - RLE edematous. Encouraged her to elevate her legs when in bed - Scr up slightly from baseline. Has CKD  - H&H stable 8.7/26 - Continue Plavix and Statin. Will discuss with Dr. Chestine Spore timing of restarting of her home coumadin - PT/ OT recommending SNF. She wants to go to [facility]. Discuss that she would have to progress more with PT/ OT for this. Encourage continue work with therapy teams    Graceann Congress, [place] [facility] [phone] 06/03/2023 7:34 AM  I have seen and evaluated the patient. I agree with the PA note as documented above.  Postop day 2 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 with ipsilateral nonreversed great saphenous vein.  Palpable pulse in the bypass graft.  Brisk DP signal in the foot.  Dressing changed to the AT exposure today with some serosanguineous drainage.  She has significant edema even preoperatively.  Out of bed  and mobilize with therapy.  PT recommending SNF but will see how she mobilizes.  On Plavix and will restart her Coumadin this weekend.  CKD and creatinine around baseline.  Cephus Shelling, MD [facility] [place] Office: [phone]

## 2023-06-03 NOTE — TOC Progression Note (Addendum)
Transition of Care [facility]) - Progression Note    Patient Details  Name: Lauren [name] MRN: [id number] Date of Birth: Mar 15, 1981  Transition of Care [facility]) CM/SW Contact  Eduard Roux, [place] Phone Number: 06/03/2023, 5:34 PM  Clinical Narrative:     5:54 pm- went back to met with patient: explained cost of SNF  ($2500/day up to $9000 and HH ($80/visit). She states understanding. Request to call her daughter, advised called her daughter but she did not answer.   5:15pm- CSW went to met with patient to discuss insurance - patient was standing up in her room, next to her chair. RN and CNA came in shortly after to assist the patient to her bed. Patient appeared to be in pain. CSW will return to discuss disposition plan because it appears SNF is not an option.   4:30pm- CSW re-viewed chart- it was noted on 01/20 by previous CSW  "CSW spoke with Lewayne Bunting rep who pulled pt's insurance coverage and noted pt has a $9000 out of pocket cost for SNF, with a $2500 a day copay until this is met".   Patient was discharged home w/ out patient physical therapay.   10:26 am- CSW met with patient to discuss short term rehab at [facility]. Patient states she lives with her mother. Patient states her mother is unable to provide needed care. Patient states she has never been to SNF but is agreeable to short term rehab at [facility]. She would like a rehab near her home. CSW explained the SNF process. All questions answered.   Antony Blackbird, MSW, LCSW Clinical Social Worker     Barriers to Discharge: Inadequate or no insurance, English as a second language teacher, Continued Medical Work up, SNF Pending bed offer  Expected Discharge Plan and Services                                               Social Determinants of Health (SDOH) Interventions SDOH Screenings   Food Insecurity: Patient Declined (06/02/2023)  Recent Concern: Food Insecurity - Food Insecurity Present (05/21/2023)  Housing: Low Risk   (06/02/2023)  Transportation Needs: No Transportation Needs (06/02/2023)  Recent Concern: Transportation Needs - Unmet Transportation Needs (05/18/2023)  Utilities: Patient Declined (06/02/2023)  Alcohol Screen: Low Risk  (11/09/2022)  Depression (PHQ2-9): Low Risk  (02/18/2023)  Recent Concern: Depression (PHQ2-9) - Medium Risk (02/04/2023)  Financial Resource Strain: Low Risk  (11/09/2022)  Physical Activity: Inactive (03/03/2022)  Social Connections: Socially Integrated (03/03/2022)  Stress: No Stress Concern Present (03/03/2022)  Recent Concern: Stress - Stress Concern Present (12/22/2021)  Tobacco Use: High Risk (05/31/2023)    Readmission Risk Interventions    05/18/2023    1:36 PM 04/22/2023    3:05 PM 03/16/2023   11:32 AM  Readmission Risk Prevention Plan  Transportation Screening Complete Complete Complete  Medication Review Oceanographer) Complete Complete Complete  PCP or Specialist appointment within 3-5 days of discharge  Not Complete Not Complete  HRI or Home Care Consult Complete Complete Complete  SW Recovery Care/Counseling Consult Complete Complete Complete  Palliative Care Screening Not Applicable Not Applicable Not Applicable  Skilled Nursing Facility Complete Not Applicable Not Applicable

## 2023-06-04 LAB — LIPID PANEL
Cholesterol: 61 mg/dL (ref 0–200)
HDL: 28 mg/dL — ABNORMAL LOW (ref >40)
LDL Cholesterol: 12 mg/dL (ref 0–99)
Total CHOL/HDL Ratio: 2.2 {ratio}
Triglycerides: 103 mg/dL (ref <150)
VLDL: 21 mg/dL (ref 0–40)

## 2023-06-04 LAB — BASIC METABOLIC PANEL
Anion gap: 11 (ref 5–15)
BUN: 22 mg/dL — ABNORMAL HIGH (ref 6–20)
CO2: 17 mmol/L — ABNORMAL LOW (ref 22–32)
Calcium: 7.7 mg/dL — ABNORMAL LOW (ref 8.9–10.3)
Chloride: 110 mmol/L (ref 98–111)
Creatinine, Ser: 2.12 mg/dL — ABNORMAL HIGH (ref 0.44–1.00)
GFR, Estimated: 30 mL/min — ABNORMAL LOW (ref >60)
Glucose, Bld: 143 mg/dL — ABNORMAL HIGH (ref 70–99)
Potassium: 4.3 mmol/L (ref 3.5–5.1)
Sodium: 138 mmol/L (ref 135–145)

## 2023-06-04 LAB — CBC
HCT: 29.9 % — ABNORMAL LOW (ref 36.0–46.0)
Hemoglobin: 9.2 g/dL — ABNORMAL LOW (ref 12.0–15.0)
MCH: 28.6 pg (ref 26.0–34.0)
MCHC: 30.8 g/dL (ref 30.0–36.0)
MCV: 92.9 fL (ref 80.0–100.0)
Platelets: 187 10*3/uL (ref 150–400)
RBC: 3.22 MIL/uL — ABNORMAL LOW (ref 3.87–5.11)
RDW: 17.6 % — ABNORMAL HIGH (ref 11.5–15.5)
WBC: 12.3 10*3/uL — ABNORMAL HIGH (ref 4.0–10.5)
nRBC: 0.2 % (ref 0.0–0.2)

## 2023-06-04 LAB — GLUCOSE, CAPILLARY
Glucose-Capillary: 107 mg/dL — ABNORMAL HIGH (ref 70–99)
Glucose-Capillary: 112 mg/dL — ABNORMAL HIGH (ref 70–99)
Glucose-Capillary: 127 mg/dL — ABNORMAL HIGH (ref 70–99)
Glucose-Capillary: 162 mg/dL — ABNORMAL HIGH (ref 70–99)
Glucose-Capillary: 176 mg/dL — ABNORMAL HIGH (ref 70–99)
Glucose-Capillary: 190 mg/dL — ABNORMAL HIGH (ref 70–99)

## 2023-06-04 NOTE — Progress Notes (Signed)
Hypoglycemic Event  CBG: 30 mg/dl  Treatment: orange ZOXWR,60 ml D50  Symptoms: none  Follow-up CBG:2203 Time: CBG Result: 171mg /dl  Possible Reasons for Event: inadequate food in take(dinner)  Comments/MD notified: none    Jamey Reas, Severus Brodzinski [employer]

## 2023-06-04 NOTE — Progress Notes (Addendum)
  Progress Note    06/04/2023 8:36 AM 3 Days Post-Op  Subjective: sitting on side of bed. Helped NT assist patient back into bed. Still complaining of a lot of pain in right leg, however once back in bed she kept dozing off.   Vitals:   06/04/23 0312 06/04/23 0800  BP: (!) 140/83 116/63  Pulse: 86 87  Resp: 18 17  Temp: 98.6 F (37 C)   SpO2: 98% 100%   Physical Exam: Cardiac:  regular Lungs:  non labored Incisions:  Right groin, right thigh and lower leg incisions all intact. Right lower leg incision still oozing SS drainage. 4x4s and ACE reapplied Extremities:  RLE well perfused and warm with Doppler DP and PT signals Abdomen:  soft Neurologic: alert and oriented  CBC    Component Value Date/Time   WBC 12.3 (H) 06/04/2023 0258   RBC 3.22 (L) 06/04/2023 0258   HGB 9.2 (L) 06/04/2023 0258   HGB 12.3 02/18/2023 1032   HCT 29.9 (L) 06/04/2023 0258   HCT 38.4 02/18/2023 1032   PLT 187 06/04/2023 0258   PLT 347 02/18/2023 1032   MCV 92.9 06/04/2023 0258   MCV 93 02/18/2023 1032   MCH 28.6 06/04/2023 0258   MCHC 30.8 06/04/2023 0258   RDW 17.6 (H) 06/04/2023 0258   RDW 14.0 02/18/2023 1032   LYMPHSABS 1.9 05/17/2023 1745   LYMPHSABS 1.9 08/11/2022 1510   MONOABS 0.7 05/17/2023 1745   EOSABS 0.2 05/17/2023 1745   EOSABS 0.1 08/11/2022 1510   BASOSABS 0.0 05/17/2023 1745   BASOSABS 0.1 08/11/2022 1510    BMET    Component Value Date/Time   NA 138 06/04/2023 0258   NA 140 08/11/2022 1511   K 4.3 06/04/2023 0258   CL 110 06/04/2023 0258   CO2 17 (L) 06/04/2023 0258   GLUCOSE 143 (H) 06/04/2023 0258   BUN 22 (H) 06/04/2023 0258   BUN 37 (H) 08/11/2022 1511   CREATININE 2.12 (H) 06/04/2023 0258   CALCIUM 7.7 (L) 06/04/2023 0258   GFRNONAA 30 (L) 06/04/2023 0258   GFRAA 31 (L) 02/01/2020 1328    INR    Component Value Date/Time   INR 1.2 06/01/2023 0752   INR 3.4 09/30/2022 0911     Intake/Output Summary (Last 24 hours) at 06/04/2023 0836 Last data  filed at 06/04/2023 0700 Gross per 24 hour  Intake 520 ml  Output 550 ml  Net -30 ml     Assessment/Plan:  43 y.o. female is s/p R SFA to ATA bypass with vein  3 Days Post-Op   - RLE incisions are intact and well appearing -RLE remains well perfused.Palpable pulse in the bypass graft. Brisk DP signal in the foot and in bypass. -Dressing changed to the AT exposure today with some serosanguineous drainage. She has significant edema even preoperatively. Continue to elevate when in bed. ACE wrap applied - continue pain control - H&H stable - Continue Plavix and Statin. Will resume home coumadin tomorrow -  Out of bed and mobilize with therapy. PT recommending SNF  Graceann Congress, [place] [facility] [phone] 06/04/2023 8:36 AM  I have independently interviewed and examined patient and agree with PA assessment and plan above.   Tauno Falotico C. Randie Heinz, MD [facility] [place] Office: [phone] Pager: [phone]

## 2023-06-05 LAB — GLUCOSE, CAPILLARY
Glucose-Capillary: 102 mg/dL — ABNORMAL HIGH (ref 70–99)
Glucose-Capillary: 114 mg/dL — ABNORMAL HIGH (ref 70–99)
Glucose-Capillary: 128 mg/dL — ABNORMAL HIGH (ref 70–99)
Glucose-Capillary: 67 mg/dL — ABNORMAL LOW (ref 70–99)
Glucose-Capillary: 76 mg/dL (ref 70–99)
Glucose-Capillary: 93 mg/dL (ref 70–99)

## 2023-06-05 LAB — BASIC METABOLIC PANEL
Anion gap: 6 (ref 5–15)
BUN: 22 mg/dL — ABNORMAL HIGH (ref 6–20)
CO2: 20 mmol/L — ABNORMAL LOW (ref 22–32)
Calcium: 7.9 mg/dL — ABNORMAL LOW (ref 8.9–10.3)
Chloride: 111 mmol/L (ref 98–111)
Creatinine, Ser: 2.05 mg/dL — ABNORMAL HIGH (ref 0.44–1.00)
GFR, Estimated: 31 mL/min — ABNORMAL LOW (ref >60)
Glucose, Bld: 77 mg/dL (ref 70–99)
Potassium: 3.9 mmol/L (ref 3.5–5.1)
Sodium: 137 mmol/L (ref 135–145)

## 2023-06-05 LAB — PROTIME-INR
INR: 1.3 — ABNORMAL HIGH (ref 0.8–1.2)
Prothrombin Time: 16.7 s — ABNORMAL HIGH (ref 11.4–15.2)

## 2023-06-05 MED ORDER — WARFARIN SODIUM 2.5 MG PO TABS
2.5000 mg | ORAL_TABLET | Freq: Once | ORAL | Status: AC
  Administered 2023-06-05: 2.5 mg via ORAL
  Filled 2023-06-05: qty 1

## 2023-06-05 MED ORDER — WARFARIN - PHARMACIST DOSING INPATIENT
Freq: Every day | Status: DC
Start: 2023-06-05 — End: 2023-06-10

## 2023-06-05 MED ORDER — DIPHENHYDRAMINE HCL 25 MG PO CAPS
25.0000 mg | ORAL_CAPSULE | Freq: Four times a day (QID) | ORAL | Status: DC | PRN
  Administered 2023-06-05 – 2023-06-07 (×3): 25 mg via ORAL
  Filled 2023-06-05 (×3): qty 1

## 2023-06-05 NOTE — Progress Notes (Signed)
FALL: Patient noted yelling out by this nurse, arrived in pt's room and noted pt sitting on end of recliner stating she wanted to get up.

## 2023-06-05 NOTE — Plan of Care (Signed)
  Problem: Activity: Goal: Risk for activity intolerance will decrease Outcome: Progressing   Problem: Activity: Goal: Risk for activity intolerance will decrease Outcome: Progressing   

## 2023-06-05 NOTE — Progress Notes (Signed)
   06/05/23 1151  What Happened  Was fall witnessed? Yes  Who witnessed fall? RN  Patients activity before fall to/from bed, chair, or stretcher  Point of contact buttocks  Was patient injured? No  Provider Notification  Provider Name/Title Dr. Randie Heinz  Date Provider Notified 06/05/23  Time Provider Notified 1156  Method of Notification Page  Notification Reason Fall  Provider response See new orders  Date of Provider Response 06/05/23  Time of Provider Response 1156  Follow Up  Family notified Yes - comment  Time family notified 1157  Additional tests No  Progress note created (see row info) Yes  Blank note created Yes  Adult Fall Risk Assessment  Risk Factor Category (scoring not indicated) High fall risk per protocol (document High fall risk)  Age 43  Fall History: Fall within 6 months prior to admission 5  Elimination; Bowel and/or Urine Incontinence 0  Elimination; Bowel and/or Urine Urgency/Frequency 0  Medications: includes PCA/Opiates, Anti-convulsants, Anti-hypertensives, Diuretics, Hypnotics, Laxatives, Sedatives, and Psychotropics 3  Patient Care Equipment 2  Mobility-Assistance 2  Mobility-Gait 2  Mobility-Sensory Deficit 0  Altered awareness of immediate physical environment 0  Impulsiveness 2  Lack of understanding of one's physical/cognitive limitations 4  Total Score 20  Patient Fall Risk Level High fall risk  Adult Fall Risk Interventions  Required Bundle Interventions *See Row Information* High fall risk - low, moderate, and high requirements implemented  Additional Interventions Camera surveillance (with patient/family notification & education);Use of appropriate toileting equipment (bedpan, BSC, etc.)  Fall intervention(s) refused/Patient educated regarding refusal Bed alarm;Nonskid socks;Yellow bracelet;Open door if unsupervised;Supervision while toileting/edge of bed sitting  Screening for Fall Injury Risk (To be completed on HIGH fall risk patients) -  Assessing Need for Floor Mats  Risk For Fall Injury- Criteria for Floor Mats Noncompliant with safety precautions  Will Implement Floor Mats Yes  Vitals  Temp 98.5 F (36.9 C)  Temp Source Axillary  BP 139/78  MAP (mmHg) 97  BP Location Right Arm  BP Method Automatic  Patient Position (if appropriate) Lying  Pulse Rate 91  Pulse Rate Source Monitor  Cardiac Rhythm NSR  Resp 20  Oxygen Therapy  SpO2 100 %  O2 Device Room Air  Pain Assessment  Pain Scale 0-10  Pain Score 9  Pain Type Surgical pain  Pain Location Leg  Pain Orientation Right  Pain Descriptors / Indicators Aching  Pain Frequency Intermittent  Pain Onset On-going  Patients Stated Pain Goal 2  Pain Intervention(s) Medication (See eMAR)  Neurological  Neuro (WDL) X  Level of Consciousness Alert  Orientation Level Oriented X4  Cognition Impulsive;Poor judgement  Speech Clear  Glasgow Coma Scale  Eye Opening 4  Best Verbal Response (NON-intubated) 5  Modified Verbal Response (INTUBATED) 5  Best Motor Response 6  Glasgow Coma Scale Score (!) 20

## 2023-06-05 NOTE — Progress Notes (Signed)
PHARMACY - ANTICOAGULATION CONSULT NOTE  Pharmacy Consult for Warfarin Indication:  CVA  Allergies  Allergen Reactions   Tape Other (See Comments)    Skin comes off with any adhesive   Visipaque [Iodixanol] Nausea And Vomiting    Patient had vagal response during procedure became hypertensive, and bradycardic. CO2 used during procedure prior to contrast being used, this may be cause as well. Recommended premedicating prior to contrast being used in the future.    Wellbutrin [Bupropion] Hives   Gabapentin     dizzy   Ciprofloxacin Hcl Hives and Rash    Hives/rash at injection site; 01/15/22 tolerated IV cipro    Patient Measurements: Height: 5\' 5"  (165.1 cm) Weight: 95.6 kg (210 lb 12.2 oz) IBW/kg (Calculated) : 57  Vital Signs: Temp: 97.7 F (36.5 C) (02/02 0810) Temp Source: Oral (02/02 0810) BP: 130/79 (02/02 0810) Pulse Rate: 79 (02/02 0810)  Labs: Recent Labs    06/03/23 0317 06/04/23 0258 06/05/23 0311  HGB 8.7* 9.2*  --   HCT 26.8* 29.9*  --   PLT 241 187  --   LABPROT  --   --  16.7*  INR  --   --  1.3*  CREATININE 2.42* 2.12* 2.05*    Estimated Creatinine Clearance: 41.7 mL/min (A) (by C-G formula based on SCr of 2.05 mg/dL (H)).   Medical History: Past Medical History:  Diagnosis Date   Acute ischemic stroke (HCC) 01/01/2020   Acute kidney injury superimposed on CKD (HCC) 01/01/2020   Acute right MCA stroke (HCC) 01/28/2022   Anemia    CAD (coronary artery disease)    a. s/p cath in 03/2014 showing 30% mid-LAD, moderate to severe disease along small D1, patent LCx, moderate to severe distal OM2 stenosis and moderate diffuse diease along RCA not amenable to PCI   Cerebrovascular accident (CVA) (HCC) 12/31/2019   CHF (congestive heart failure) (HCC)    a. EF 55-60% in 12/2019 b. EF at 35-40% by echo in 05/2020   CKD (chronic kidney disease) stage 4, GFR 15-29 ml/min (HCC)    Diabetes mellitus without complication (HCC)    Myocardial infarction (HCC)     Neuropathy    PONV (postoperative nausea and vomiting)    PVD (peripheral vascular disease) (HCC)    Stroke (HCC) 01/2022   L hand weakness    Medications:  Scheduled:   atorvastatin  40 mg Oral Daily   cephALEXin  500 mg Oral Q8H   clopidogrel  75 mg Oral Daily   docusate sodium  100 mg Oral Daily   doxycycline  100 mg Oral Q12H   heparin  5,000 Units Subcutaneous Q8H   hydrALAZINE  25 mg Oral BID   insulin aspart  0-15 Units Subcutaneous Q4H   insulin glargine-yfgn  8 Units Subcutaneous BID   isosorbide mononitrate  60 mg Oral Daily   levothyroxine  125 mcg Oral Q0600   metoprolol succinate  50 mg Oral Daily   nystatin cream   Topical BID   pantoprazole  40 mg Oral Daily   topiramate  50 mg Oral BID   Infusions:   sodium chloride     magnesium sulfate bolus IVPB     Assessment: 42YOF with history of CVA, on warfarin, who is s/p R SFA to ATA bypass. Home warfarin dose: 5mg  M/W/F/Sun and 2.5mg  T/T/Sat. Last warfarin dose was 1/24. INR on admission was 1.2 on 1/29. Pharmacy has been consulted to restart warfarin at low dose. Confirmed with [facility] no  plans to bridge.  INR 1.3 (subtherapeutic). No new CBC today but has been stable.   Goal of Therapy:  INR 2-3 Monitor platelets by anticoagulation protocol: Yes   Plan:  Warfarin 2.5mg  PO x1 F/u daily INR and CBC  Lauren [name] H Lauren [name] 06/05/2023,10:33 AM

## 2023-06-05 NOTE — Progress Notes (Addendum)
  Progress Note    06/05/2023 9:08 AM 4 Days Post-Op  Subjective:  seems somewhat confused this morning. Sitting on side of chair but tried to stand and pivot to show [name] her leg. Helped to assist her back to chair   Vitals:   06/05/23 0454 06/05/23 0810  BP: 91/73 130/79  Pulse: 81 79  Resp: 18   Temp: 98 F (36.7 C) 97.7 F (36.5 C)  SpO2: 100% 100%   Physical Exam: Cardiac:  regular Lungs:  non labored Incisions:  right leg incisions are all intact. Right lateral leg incision  Extremities: palpable pulse in bypass graft on lateral side of knee. RLE remains very edematous.ACE wrap in place on lower leg Neurologic: alert and oriented  CBC    Component Value Date/Time   WBC 12.3 (H) 06/04/2023 0258   RBC 3.22 (L) 06/04/2023 0258   HGB 9.2 (L) 06/04/2023 0258   HGB 12.3 02/18/2023 1032   HCT 29.9 (L) 06/04/2023 0258   HCT 38.4 02/18/2023 1032   PLT 187 06/04/2023 0258   PLT 347 02/18/2023 1032   MCV 92.9 06/04/2023 0258   MCV 93 02/18/2023 1032   MCH 28.6 06/04/2023 0258   MCHC 30.8 06/04/2023 0258   RDW 17.6 (H) 06/04/2023 0258   RDW 14.0 02/18/2023 1032   LYMPHSABS 1.9 05/17/2023 1745   LYMPHSABS 1.9 08/11/2022 1510   MONOABS 0.7 05/17/2023 1745   EOSABS 0.2 05/17/2023 1745   EOSABS 0.1 08/11/2022 1510   BASOSABS 0.0 05/17/2023 1745   BASOSABS 0.1 08/11/2022 1510    BMET    Component Value Date/Time   NA 137 06/05/2023 0311   NA 140 08/11/2022 1511   K 3.9 06/05/2023 0311   CL 111 06/05/2023 0311   CO2 20 (L) 06/05/2023 0311   GLUCOSE 77 06/05/2023 0311   BUN 22 (H) 06/05/2023 0311   BUN 37 (H) 08/11/2022 1511   CREATININE 2.05 (H) 06/05/2023 0311   CALCIUM 7.9 (L) 06/05/2023 0311   GFRNONAA 31 (L) 06/05/2023 0311   GFRAA 31 (L) 02/01/2020 1328    INR    Component Value Date/Time   INR 1.3 (H) 06/05/2023 0311   INR 3.4 09/30/2022 0911    No intake or output data in the 24 hours ending 06/05/23 0908   Assessment/Plan:  43 y.o. female is  s/p R SFA to ATA bypass with vein  4 Days Post-Op   Says her leg is feeling better Incisions are intact. Palpable pulse in bypass graft on lateral knee Continue to monitor oozing from right lower leg lateral incision Per RN having a lot of itching on her abdomen. Seems to be irritated from sq heparin. Ordered benadryl PRN RLE very edematous. Encourage her to keep her legs elevated when in chair and in bed She keeps removing the ACE wrap from her leg. Replace as Able Some hypoglycemia. Poor po intake. Encourage her to eat Will plan to restart her home coumadin at 2.5 mg per pharmacy Mobilize as tolerated Dispo pending further discussions with patient and her daughter. SNF vs [facility]  Graceann Congress, [place] [facility] [phone] 06/05/2023 9:08 AM   I have interviewed and examined patient with PA and agree with assessment and plan above.   Atl[place] Pelto C. Randie Heinz, MD [facility] [place] Office: [phone] Pager: [phone]

## 2023-06-05 NOTE — Progress Notes (Signed)
Hypoglycemic Event  CBG: 67  Treatment: 8 oz juice/soda  Symptoms: None  Follow-up CBG: Time:0532 CBG Result:93  Possible Reasons for Event: Inadequate meal intake  Comments/MD notified:na     Lauren [name] P Tarik Teixeira

## 2023-06-06 LAB — BASIC METABOLIC PANEL WITH GFR
Anion gap: 8 (ref 5–15)
BUN: 22 mg/dL — ABNORMAL HIGH (ref 6–20)
CO2: 21 mmol/L — ABNORMAL LOW (ref 22–32)
Calcium: 7.9 mg/dL — ABNORMAL LOW (ref 8.9–10.3)
Chloride: 109 mmol/L (ref 98–111)
Creatinine, Ser: 2.01 mg/dL — ABNORMAL HIGH (ref 0.44–1.00)
GFR, Estimated: 32 mL/min — ABNORMAL LOW
Glucose, Bld: 49 mg/dL — ABNORMAL LOW (ref 70–99)
Potassium: 4 mmol/L (ref 3.5–5.1)
Sodium: 138 mmol/L (ref 135–145)

## 2023-06-06 LAB — CBC
HCT: 28.9 % — ABNORMAL LOW (ref 36.0–46.0)
Hemoglobin: 8.9 g/dL — ABNORMAL LOW (ref 12.0–15.0)
MCH: 28.3 pg (ref 26.0–34.0)
MCHC: 30.8 g/dL (ref 30.0–36.0)
MCV: 91.7 fL (ref 80.0–100.0)
Platelets: 311 10*3/uL (ref 150–400)
RBC: 3.15 MIL/uL — ABNORMAL LOW (ref 3.87–5.11)
RDW: 17.3 % — ABNORMAL HIGH (ref 11.5–15.5)
WBC: 6.8 10*3/uL (ref 4.0–10.5)
nRBC: 0 % (ref 0.0–0.2)

## 2023-06-06 LAB — GLUCOSE, CAPILLARY
Glucose-Capillary: 104 mg/dL — ABNORMAL HIGH (ref 70–99)
Glucose-Capillary: 118 mg/dL — ABNORMAL HIGH (ref 70–99)
Glucose-Capillary: 149 mg/dL — ABNORMAL HIGH (ref 70–99)
Glucose-Capillary: 198 mg/dL — ABNORMAL HIGH (ref 70–99)
Glucose-Capillary: 34 mg/dL — CL (ref 70–99)
Glucose-Capillary: 39 mg/dL — CL (ref 70–99)
Glucose-Capillary: 65 mg/dL — ABNORMAL LOW (ref 70–99)
Glucose-Capillary: 66 mg/dL — ABNORMAL LOW (ref 70–99)
Glucose-Capillary: 71 mg/dL (ref 70–99)
Glucose-Capillary: 81 mg/dL (ref 70–99)
Glucose-Capillary: 88 mg/dL (ref 70–99)
Glucose-Capillary: 89 mg/dL (ref 70–99)

## 2023-06-06 LAB — PROTIME-INR
INR: 1.3 — ABNORMAL HIGH (ref 0.8–1.2)
Prothrombin Time: 16.3 s — ABNORMAL HIGH (ref 11.4–15.2)

## 2023-06-06 MED ORDER — DEXTROSE 50 % IV SOLN
INTRAVENOUS | Status: AC
  Administered 2023-06-06: 25 mL
  Filled 2023-06-06: qty 50

## 2023-06-06 MED ORDER — DEXTROSE 50 % IV SOLN
INTRAVENOUS | Status: AC
  Filled 2023-06-06: qty 50

## 2023-06-06 MED ORDER — WARFARIN SODIUM 2.5 MG PO TABS
2.5000 mg | ORAL_TABLET | Freq: Once | ORAL | Status: AC
  Administered 2023-06-06: 2.5 mg via ORAL
  Filled 2023-06-06: qty 1

## 2023-06-06 MED ORDER — GLUCOSE 40 % PO GEL: 1.0000 | ORAL | Status: AC

## 2023-06-06 NOTE — Progress Notes (Addendum)
PHARMACY - ANTICOAGULATION CONSULT NOTE  Pharmacy Consult for Warfarin Indication:  CVA  Allergies  Allergen Reactions   Tape Other (See Comments)    Skin comes off with any adhesive   Visipaque [Iodixanol] Nausea And Vomiting    Patient had vagal response during procedure became hypertensive, and bradycardic. CO2 used during procedure prior to contrast being used, this may be cause as well. Recommended premedicating prior to contrast being used in the future.    Wellbutrin [Bupropion] Hives   Gabapentin     dizzy   Ciprofloxacin Hcl Hives and Rash    Hives/rash at injection site; 01/15/22 tolerated IV cipro    Patient Measurements: Height: 5\' 5"  (165.1 cm) Weight: 95.6 kg (210 lb 12.2 oz) IBW/kg (Calculated) : 57  Vital Signs: Temp: 98.5 F (36.9 C) (02/03 0808) Temp Source: Oral (02/03 0808) BP: 132/83 (02/03 0808) Pulse Rate: 109 (02/03 0808)  Labs: Recent Labs    06/04/23 0258 06/05/23 0311 06/06/23 0319  HGB 9.2*  --  8.9*  HCT 29.9*  --  28.9*  PLT 187  --  311  LABPROT  --  16.7* 16.3*  INR  --  1.3* 1.3*  CREATININE 2.12* 2.05* 2.01*    Estimated Creatinine Clearance: 42.5 mL/min (A) (by C-G formula based on SCr of 2.01 mg/dL (H)).   Medical History: Past Medical History:  Diagnosis Date   Acute ischemic stroke (HCC) 01/01/2020   Acute kidney injury superimposed on CKD (HCC) 01/01/2020   Acute right MCA stroke (HCC) 01/28/2022   Anemia    CAD (coronary artery disease)    a. s/p cath in 03/2014 showing 30% mid-LAD, moderate to severe disease along small D1, patent LCx, moderate to severe distal OM2 stenosis and moderate diffuse diease along RCA not amenable to PCI   Cerebrovascular accident (CVA) (HCC) 12/31/2019   CHF (congestive heart failure) (HCC)    a. EF 55-60% in 12/2019 b. EF at 35-40% by echo in 05/2020   CKD (chronic kidney disease) stage 4, GFR 15-29 ml/min (HCC)    Diabetes mellitus without complication (HCC)    Myocardial infarction  (HCC)    Neuropathy    PONV (postoperative nausea and vomiting)    PVD (peripheral vascular disease) (HCC)    Stroke (HCC) 01/2022   L hand weakness    Medications:  Scheduled:   atorvastatin  40 mg Oral Daily   cephALEXin  500 mg Oral Q8H   clopidogrel  75 mg Oral Daily   docusate sodium  100 mg Oral Daily   doxycycline  100 mg Oral Q12H   hydrALAZINE  25 mg Oral BID   insulin aspart  0-15 Units Subcutaneous Q4H   insulin glargine-yfgn  8 Units Subcutaneous BID   isosorbide mononitrate  60 mg Oral Daily   levothyroxine  125 mcg Oral Q0600   metoprolol succinate  50 mg Oral Daily   nystatin cream   Topical BID   pantoprazole  40 mg Oral Daily   topiramate  50 mg Oral BID   Warfarin - Pharmacist Dosing Inpatient   Does not apply q1600   Infusions:   sodium chloride     magnesium sulfate bolus IVPB     Assessment: 42YOF with history of CVA, on warfarin, who is s/p R SFA to ATA bypass. Home warfarin dose: 5mg  M/W/F/Sun and 2.5mg  T/T/Sat. Last warfarin dose was 1/24. INR on admission was 1.2 on 1/29.  Pharmacy has been consulted to restart warfarin at low dose on 06/05/23.  Confirmed with [facility] no plans to bridge.  -INR 1.3, remains subtherapeutic as expected -Poor appetite this morning per RN; no meals charted yesterday -DDI's of note - Keflex 500 mg Q8h, doxycycline 100 mg BID (on PTA since 1/25, stopping today per [facility]), plavix  Goal of Therapy:  INR 2-3 Monitor platelets by anticoagulation protocol: Yes   Plan:  Warfarin 2.5mg  PO x1 F/u daily INR and CBC  Trixie Rude, PharmD Clinical Pharmacist 06/06/2023  9:07 AM

## 2023-06-06 NOTE — Progress Notes (Addendum)
Progress Note    06/06/2023 8:08 AM 5 Days Post-Op  Subjective: still having significant pain in right leg    Vitals:   06/05/23 2350 06/06/23 0550  BP: (!) 147/83 (!) 152/91  Pulse: (!) 105 (!) 101  Resp: 20 18  Temp: 97.6 F (36.4 C)   SpO2: 96% 99%   Physical Exam: Cardiac:  tachy Lungs:  non labored Incisions:  right thigh incisions and lower leg incisions intact and well appearing. Less SS drainage from lower leg incision Extremities:  RLE remains edematous. Palpable pulse on lateral side of knee. Brisk DP signal in foot Neurologic: alert and oriented   CBC    Component Value Date/Time   WBC 6.8 06/06/2023 0319   RBC 3.15 (L) 06/06/2023 0319   HGB 8.9 (L) 06/06/2023 0319   HGB 12.3 02/18/2023 1032   HCT 28.9 (L) 06/06/2023 0319   HCT 38.4 02/18/2023 1032   PLT 311 06/06/2023 0319   PLT 347 02/18/2023 1032   MCV 91.7 06/06/2023 0319   MCV 93 02/18/2023 1032   MCH 28.3 06/06/2023 0319   MCHC 30.8 06/06/2023 0319   RDW 17.3 (H) 06/06/2023 0319   RDW 14.0 02/18/2023 1032   LYMPHSABS 1.9 05/17/2023 1745   LYMPHSABS 1.9 08/11/2022 1510   MONOABS 0.7 05/17/2023 1745   EOSABS 0.2 05/17/2023 1745   EOSABS 0.1 08/11/2022 1510   BASOSABS 0.0 05/17/2023 1745   BASOSABS 0.1 08/11/2022 1510    BMET    Component Value Date/Time   NA 138 06/06/2023 0319   NA 140 08/11/2022 1511   K 4.0 06/06/2023 0319   CL 109 06/06/2023 0319   CO2 21 (L) 06/06/2023 0319   GLUCOSE 49 (L) 06/06/2023 0319   BUN 22 (H) 06/06/2023 0319   BUN 37 (H) 08/11/2022 1511   CREATININE 2.01 (H) 06/06/2023 0319   CALCIUM 7.9 (L) 06/06/2023 0319   GFRNONAA 32 (L) 06/06/2023 0319   GFRAA 31 (L) 02/01/2020 1328    INR    Component Value Date/Time   INR 1.3 (H) 06/06/2023 0319   INR 3.4 09/30/2022 0911     Intake/Output Summary (Last 24 hours) at 06/06/2023 1478 Last data filed at 06/06/2023 0645 Gross per 24 hour  Intake 500 ml  Output --  Net 500 ml     Assessment/Plan:  43  y.o. female is s/p R SFA to ATA bypass with vein  5 Days Post-Op   RLE incisions are intact and well appearing. Right lower leg incision seems to be much less oozy Still with a lot of edema in the leg. Continue to encourage elevation when in chair or bed Right leg with brisk DP pulse and palpable pulse on lateral aspect of right knee Some hypoglycemia. Encourage po intake Coumadin was started yesterday. No bleeding issues PT recommending SNF however high deductible is limiting factor. Orders placed for [facility] PT/OT Encourage her to continue to mobilize as tolerated and work with therapy teams Anticipate d/c in next day or so once dispo arranged  Graceann Congress, PA-C [facility] [phone] 06/06/2023 8:08 AM  I have seen and evaluated the patient. I agree with the PA note as documented above.  Postop day 5 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.  Palpable pulse in the bypass graft.  Brisk DP signal in the foot.  Coumadin has been restarted and INR 1.3.  Therapy recommending SNF but states she cannot afford.  Will go home with home health and lives with her  mother.  Probably needs another 1 or 2 days to mobilize.  Creatinine at baseline with CKD.  WBC normal.  Cephus Shelling, MD [facility] [place] Office: [phone]

## 2023-06-06 NOTE — Inpatient Diabetes Management (Signed)
Inpatient Diabetes Program Recommendations  AACE/ADA: New Consensus Statement on Inpatient Glycemic Control (2015)  Target Ranges:  Prepandial:   less than 140 mg/dL      Peak postprandial:   less than 180 mg/dL (1-2 hours)      Critically ill patients:  140 - 180 mg/dL   Lab Results  Component Value Date   GLUCAP 88 06/06/2023   HGBA1C 8.7 (H) 04/22/2023    Review of Glycemic Control  Latest Reference Range & Units 06/05/23 05:31 06/05/23 08:12 06/05/23 11:53 06/05/23 16:47 06/05/23 20:40 06/06/23 00:02 06/06/23 05:47 06/06/23 05:50 06/06/23 06:16 06/06/23 08:07 06/06/23 10:00  Glucose-Capillary 70 - 99 mg/dL 93 76 71 244 (H) 010 (H) 104 (H) 34 (LL) 39 (LL) 198 (H) 89 88   Diabetes history: DM 1 (since age 43) Outpatient Diabetes medications:  Omnipod insulin pump Basal 12A   0.9 units/hr Total Basal: 21.6 units/day   Insulin to Carb Ratio 1:5 grams Insulin Sensitivity Factor  1:38 mg/dl Current orders for Inpatient glycemic control:  Semglee 8 units bid Novolog 0-15 units Q4 hours  Note: hypoglycemia  Inpatient Diabetes Program Recommendations:    -   Reduce Semglee to 5 units bid  Thanks,  Christena Deem RN, MSN, BC-ADM Inpatient Diabetes Coordinator Team Pager [phone] (8a-5p)

## 2023-06-06 NOTE — Progress Notes (Signed)
Hypoglycemic Event  CBG: 39  Treatment: D50 25 ml,orange juice  Symptoms: none  Follow-up CBG: Time:0616 CBG Result:198  Possible Reasons for Event: Inadequate meal intake  Comments/MD notified:no    Lauren [name], Hannah Crill [employer]

## 2023-06-06 NOTE — TOC Progression Note (Signed)
Transition of Care (TOC) - Progression Note  Donn Pierini RN, BSN Transitions of Care Unit 4E- RN Case Manager See Treatment Team for direct phone #   Patient Details  Name: Lauren [name] MRN: [id number] Date of Birth: [birth year]  Transition of Care [facility]) CM/SW Contact  Zenda Alpers Lenn Sink, RN Phone Number: 06/06/2023, 4:42 PM  Clinical Narrative:    Noted that SNF is not likely an option and plan is for pt to return home w/ HH, orders placed.   Pt  active with Frances [facility]urb[facility] PTA for HHRN/PT- CM spoke with liaison and confirmed that they can still service pt for [facility] needs- RN/PT/OT. Liaison to follow for discharge and resume HH services.    Expected Discharge Plan: Home w Home Health Services Barriers to Discharge: Inadequate or no insurance, English as a second language teacher, Continued Medical Work up, SNF Pending bed offer  Expected Discharge Plan and Services In-house Referral: Clinical Social Work Discharge Planning Services: [employer] Consult Post Acute Care Choice: Home Health, Resumption of Svcs/PTA Provider Living arrangements for the past 2 months: Single Family Home                 DME Arranged: N/A DME Agency: NA       HH Arranged: RN, PT, OT HH Agency: [facility] Date HH Agency Contacted: 06/06/23 Time HH Agency Contacted: 1100 Representative spoke with at [facility] Agency: Kandee Keen   Social Determinants of Health (SDOH) Interventions SDOH Screenings   Food Insecurity: Patient Declined (06/02/2023)  Recent Concern: Food Insecurity - Food Insecurity Present (05/21/2023)  Housing: Low Risk  (06/02/2023)  Transportation Needs: No Transportation Needs (06/02/2023)  Recent Concern: Transportation Needs - Unmet Transportation Needs (05/18/2023)  Utilities: Patient Declined (06/02/2023)  Alcohol Screen: Low Risk  (11/09/2022)  Depression (PHQ2-9): Low Risk  (02/18/2023)  Recent Concern: Depression (PHQ2-9) - Medium Risk (02/04/2023)  Financial Resource Strain: Low Risk  (11/09/2022)   Physical Activity: Inactive (03/03/2022)  Social Connections: Socially Integrated (03/03/2022)  Stress: No Stress Concern Present (03/03/2022)  Recent Concern: Stress - Stress Concern Present (12/22/2021)  Tobacco Use: High Risk (05/31/2023)    Readmission Risk Interventions    05/18/2023    1:36 PM 04/22/2023    3:05 PM 03/16/2023   11:32 AM  Readmission Risk Prevention Plan  Transportation Screening Complete Complete Complete  Medication Review Oceanographer) Complete Complete Complete  PCP or Specialist appointment within 3-5 days of discharge  Not Complete Not Complete  HRI or Home Care Consult Complete Complete Complete  SW Recovery Care/Counseling Consult Complete Complete Complete  Palliative Care Screening Not Applicable Not Applicable Not Applicable  Skilled Nursing Facility Complete Not Applicable Not Applicable

## 2023-06-06 NOTE — Progress Notes (Signed)
Physical Therapy Treatment Patient Details Name: Lauren [name] MRN: [id number] DOB: 07/13/1980 Today's Date: 06/06/2023   History of Present Illness Lauren [name] is a 43 y.o. female admitted 06/01/23 with RLE critical limb ischemia and tissue loss. Pt underwent right SFA to AT bypass graft. 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    PT Comments  Patient progressing slowly with mobility due to pain, lethargy, low CBG.  Was able to mobilize with +1 A though unsteady and confused removing sling throughout.  Patient stood for extended time to help with donning briefs after toileting, but confused and unsafe to leave upright.  Patient remains appropriate for inpatient rehab (<3 hours/day) at d/c.     If plan is discharge home, recommend the following: Help with stairs or ramp for entrance;Assistance with cooking/housework;A lot of help with walking and/or transfers;Assist for transportation   Can travel by private vehicle     No  Equipment Recommendations  Other (comment) (TBA)    Recommendations for Other Services       Precautions / Restrictions Precautions Precautions: Fall Precaution Comments: L UE hemiparesis; watch CBG Required Braces or Orthoses: Sling Other Brace: post op shoe Restrictions LUE Weight Bearing Per Provider Order: Non weight bearing RLE Weight Bearing Per Provider Order: Weight bearing as tolerated     Mobility  Bed Mobility Overal bed mobility: Needs Assistance Bed Mobility: Supine to Sit, Sit to Supine     Supine to sit: Min assist, HOB elevated Sit to supine: Mod assist   General bed mobility comments: assist up to EOB pt with R HHA HOB up; to supine assist for legs into bed     Transfers Overall transfer level: Needs assistance Equipment used: 1 person hand held assist Transfers: Sit to/from Stand, Bed to chair/wheelchair/BSC Sit to Stand: Mod assist   Step pivot transfers: Mod assist       General transfer comment: up to stand then pt requesting to sit to  EOB then up to recliner with A For balance cue for safety and hand placement; pt requesting to toilet so then up to [facility]; RN in to check CBG (60) RN gave OJ then assisted to supine    Ambulation/Gait                   Stairs             Wheelchair Mobility     Tilt Bed    Modified Rankin (Stroke Patients Only)       Balance Overall balance assessment: Needs assistance Sitting-balance support: Feet supported Sitting balance-Leahy Scale: Fair Sitting balance - Comments: S for sitting unsupported as lethargic   Standing balance support: Single extremity supported Standing balance-Leahy Scale: Poor Standing balance comment: UE support for balance                            Cognition Arousal: Lethargic Behavior During Therapy: Anxious Overall Cognitive Status: Impaired/Different from baseline Area of Impairment: Orientation, Attention, Following commands, Safety/judgement                 Orientation Level: Disoriented to, Place, Time Current Attention Level: Focused   Following Commands: Follows one step commands inconsistently, Follows one step commands with increased  time Safety/Judgement: Decreased awareness of safety, Decreased awareness of deficits     General Comments: stated at home and not answering what day/month; RN in to check CBG was 60 so started OJ and pt a little more alert        Exercises      General Comments General comments (skin integrity, edema, etc.): donned sling prior to mobilizing, then pt removed x 2; itching and very slow to respond, closing eyes much of session, other VSS; needed A for hygiene and to change briefs       Pertinent Vitals/Pain Pain Assessment Pain Assessment: Faces Faces Pain Scale: Hurts even more Pain Location: legs Pain Descriptors / Indicators: Aching, Guarding, Moaning Pain Intervention(s): Monitored during session, Repositioned, Premedicated before session    Home Living                          Prior Function            PT Goals (current goals can now be found in the care plan section) Progress towards PT goals: Progressing toward goals    Frequency    Min 1X/week      PT Plan      Co-evaluation              AM-PAC PT "6 Clicks" Mobility   Outcome Measure  Help needed turning from your back to your side while in a flat bed without using bedrails?: A Little Help needed moving from lying on your back to sitting on the side of a flat bed without using bedrails?: A Little Help needed moving to and from a bed to a chair (including a wheelchair)?: A Lot Help needed standing up from a chair using your arms (e.g., wheelchair or bedside chair)?: A Lot Help needed to walk in hospital room?: Total Help needed climbing 3-5 steps with a railing? : Total 6 Click Score: 12    End of Session Equipment Utilized During Treatment: Gait belt Activity Tolerance: Patient limited by fatigue;Patient limited by lethargy Patient left: in bed;with call bell/phone within reach;with bed alarm set;with nursing/sitter in room   PT Visit Diagnosis: Other abnormalities of gait and mobility (R26.89);Pain;Other symptoms and signs involving the nervous system (R29.898) Pain - Right/Left: Right Pain - part of body: Knee     Time: 4132-4401 PT Time Calculation (min) (ACUTE ONLY): 40 min  Charges:    $Therapeutic Activity: 38-52 mins PT General Charges $$ ACUTE PT VISIT: 1 Visit                     Lauren [name], PT Acute Rehabilitation Services Office:[phone] 06/06/2023    [name] 06/06/2023, 1:42 PM

## 2023-06-06 NOTE — Plan of Care (Signed)
°  Problem: Clinical Measurements: Goal: Ability to maintain clinical measurements within normal limits will improve Outcome: Progressing Goal: Will remain free from infection Outcome: Progressing Goal: Diagnostic test results will improve Outcome: Progressing Goal: Respiratory complications will improve Outcome: Progressing Goal: Cardiovascular complication will be avoided Outcome: Progressing   Problem: Nutrition: Goal: Adequate nutrition will be maintained Outcome: Not Progressing

## 2023-06-06 NOTE — Progress Notes (Signed)
Mobility Specialist Progress Note:    06/06/23 1529  Mobility  Activity Transferred to/from [facility]  Level of Assistance Minimal assist, patient does 75% or more  Assistive Device Other (Comment) (HHA)  Distance Ambulated (ft) 4 ft  RLE Weight Bearing Per Provider Order WBAT  Activity Response Tolerated well  Mobility Referral Yes  Mobility visit 1 Mobility  Mobility Specialist Start Time (ACUTE ONLY) 1520  Mobility Specialist Stop Time (ACUTE ONLY) 1529  Mobility Specialist Time Calculation (min) (ACUTE ONLY) 9 min   Pt received in bed, seemingly lethargic but agreeable. Transferred B>BSC via HHA. Tolerated well, c/o pain. Pt returned to bed, alarm on, call bell in reach. Pt requesting to leave AMA, states "she's ready to go home."  Feliciana Rossetti Mobility Specialist Please contact via SecureChat or  Rehab office at [phone]

## 2023-06-07 ENCOUNTER — Encounter: Payer: Self-pay | Admitting: Podiatry

## 2023-06-07 LAB — CBC
HCT: 29.2 % — ABNORMAL LOW (ref 36.0–46.0)
Hemoglobin: 8.9 g/dL — ABNORMAL LOW (ref 12.0–15.0)
MCH: 28 pg (ref 26.0–34.0)
MCHC: 30.5 g/dL (ref 30.0–36.0)
MCV: 91.8 fL (ref 80.0–100.0)
Platelets: 377 10*3/uL (ref 150–400)
RBC: 3.18 MIL/uL — ABNORMAL LOW (ref 3.87–5.11)
RDW: 17.5 % — ABNORMAL HIGH (ref 11.5–15.5)
WBC: 7.3 10*3/uL (ref 4.0–10.5)
nRBC: 0 % (ref 0.0–0.2)

## 2023-06-07 LAB — GLUCOSE, CAPILLARY
Glucose-Capillary: 121 mg/dL — ABNORMAL HIGH (ref 70–99)
Glucose-Capillary: 123 mg/dL — ABNORMAL HIGH (ref 70–99)
Glucose-Capillary: 135 mg/dL — ABNORMAL HIGH (ref 70–99)
Glucose-Capillary: 148 mg/dL — ABNORMAL HIGH (ref 70–99)
Glucose-Capillary: 174 mg/dL — ABNORMAL HIGH (ref 70–99)
Glucose-Capillary: 50 mg/dL — ABNORMAL LOW (ref 70–99)
Glucose-Capillary: 97 mg/dL (ref 70–99)

## 2023-06-07 LAB — PROTIME-INR
INR: 1.4 — ABNORMAL HIGH (ref 0.8–1.2)
Prothrombin Time: 17.7 s — ABNORMAL HIGH (ref 11.4–15.2)

## 2023-06-07 MED ORDER — DEXTROSE 50 % IV SOLN: 25.0000 g | INTRAVENOUS | Status: AC

## 2023-06-07 MED ORDER — DEXTROSE 50 % IV SOLN
INTRAVENOUS | Status: AC
  Administered 2023-06-07: 25 mL
  Filled 2023-06-07: qty 50

## 2023-06-07 MED ORDER — OXYCODONE-ACETAMINOPHEN 5-325 MG PO TABS: 1.0000 | ORAL_TABLET | Freq: Four times a day (QID) | ORAL | 0 refills | Status: DC | PRN

## 2023-06-07 MED ORDER — ATORVASTATIN CALCIUM 40 MG PO TABS: 40.0000 mg | ORAL_TABLET | Freq: Every day | ORAL | 0 refills | Status: DC

## 2023-06-07 MED ORDER — WARFARIN SODIUM 2.5 MG PO TABS
2.5000 mg | ORAL_TABLET | ORAL | Status: AC
  Administered 2023-06-07: 2.5 mg via ORAL
  Filled 2023-06-07: qty 1

## 2023-06-07 NOTE — TOC Progression Note (Signed)
 Transition of Care [facility]) - Progression Note    Patient Details  Name: Lauren [name] MRN: [id number] Date of Birth: 1980-05-15  Transition of Care [facility]) CM/SW Contact  Lauren [name], Lauren [name] Phone Number: 06/07/2023, 12:25 PM  Clinical Narrative:     CSW spoke with patient's daughter, Lauren [name]. She advised the patient lives with her spouse, and her son(15). She has not been able to completed most of her ADL on her on since her stoke in 9/23. She reports patient has been staying with her mother because she has a handicap accessible apartment. However, Lauren [name] states patient's mother has serious health issues, has a caregiver of her own and is unable to assist patient with her care. The plan was for the patient to return home but she would be home alone during the day because her spouse works and not home until after 5pm( off weekends).  She stays near patient, but she works too 8am-8pm. Including, patient will have to go up stairs to enter their apartment. She expresses concern about patient's  safety at home because she requires too much help.   Lauren [name], reports patient does not meet eligibility for Medicaid because she is married and her spouse makes too much money. She reports patient has been approved for disability( she sent picture of letter) but the notice states patient has meet the medical requirement to be entitled for disability benefits, received 04/13/24 and determination will be made with in 60 days for non-medical. CSW discussed with patient's daughter, the notice and it appears disability still pending. She states understanding. CSW explained ultimately the patient will d/c home because (unable to LOG, not eligible for medicaid, family unable to meet deductible for SNF) .  TOC leadership is aware of discharge barriers.  TOC will continue to follow and assist with discharge planning.   Lauren [name], MSW, LCSW Clinical Social Worker     Expected Discharge Plan: Home w Home  Health Services Barriers to Discharge: Inadequate or no insurance, English As A Second Language Teacher, Continued Medical Work up, SNF Pending bed offer  Expected Discharge Plan and Services In-house Referral: Clinical Social Work Discharge Planning Services: CM Consult Post Acute Care Choice: Home Health, Resumption of Svcs/PTA Provider Living arrangements for the past 2 months: Single Family Home Expected Discharge Date: 06/07/23               DME Arranged: N/A DME Agency: NA       HH Arranged: RN, PT, OT HH Agency: [facility] Date [facility] Agency Contacted: 06/06/23 Time HH Agency Contacted: 1100 Representative spoke with at [facility] Agency: Lauren [name]   Social Determinants of Health (SDOH) Interventions SDOH Screenings   Food Insecurity: Patient Declined (06/02/2023)  Recent Concern: Food Insecurity - Food Insecurity Present (05/21/2023)  Housing: Low Risk  (06/02/2023)  Transportation Needs: No Transportation Needs (06/02/2023)  Recent Concern: Transportation Needs - Unmet Transportation Needs (05/18/2023)  Utilities: Patient Declined (06/02/2023)  Alcohol  Screen: Low Risk  (11/09/2022)  Depression (PHQ2-9): Low Risk  (02/18/2023)  Recent Concern: Depression (PHQ2-9) - Medium Risk (02/04/2023)  Financial Resource Strain: Low Risk  (11/09/2022)  Physical Activity: Inactive (03/03/2022)  Social Connections: Socially Integrated (03/03/2022)  Stress: No Stress Concern Present (03/03/2022)  Recent Concern: Stress - Stress Concern Present (12/22/2021)  Tobacco Use: High Risk (05/31/2023)    Readmission Risk Interventions    05/18/2023    1:36 PM 04/22/2023    3:05 PM 03/16/2023   11:32 AM  Readmission Risk Prevention Plan  Transportation Screening Complete Complete  Complete  Medication Review Oceanographer) Complete Complete Complete  PCP or Specialist appointment within 3-5 days of discharge  Not Complete Not Complete  HRI or Home Care Consult Complete Complete Complete  SW Recovery  Care/Counseling Consult Complete Complete Complete  Palliative Care Screening Not Applicable Not Applicable Not Applicable  Skilled Nursing Facility Complete Not Applicable Not Applicable

## 2023-06-07 NOTE — Progress Notes (Signed)
 Mobility Specialist Progress Note:    06/07/23 1002  Mobility  Activity Transferred from bed to chair  Level of Assistance Minimal assist, patient does 75% or more  Assistive Device Other (Comment) (HHA)  Distance Ambulated (ft) 5 ft  LUE Weight Bearing Per Provider Order NWB  RLE Weight Bearing Per Provider Order WBAT  Activity Response Tolerated well  Mobility Referral Yes  Mobility visit 1 Mobility  Mobility Specialist Start Time (ACUTE ONLY) 0955  Mobility Specialist Stop Time (ACUTE ONLY) 1002  Mobility Specialist Time Calculation (min) (ACUTE ONLY) 7 min   Pt requesting to get OOB, pt confused and unable to express needs. When offered suggestions to sit in chair, use BSC or lie down, pt declined all options. Requested to use BSC at first but grew agitated and declined using BSC. Pt unsteady and wobbling throughout while deciding on where to sit/lie. Pt repeatedly stated I don't want to hurt the baby MinA required to prevent LOB. With redirection, pt decided to sit in chair. Pt refusing post op shoe, sling, and RW.  MD requested pt to try steps d/t d/c and returning home where there are 18 steps. Pt ambulated to stairwell via CGA and gait belt. Pt holding on to hand rails for support. Pt unable to take one step. Returned pt back to room via recliner. Chair alarm on, tele sitter in room, call bell in reach.  [name] Mobility Specialist Please contact via Special Educational Needs Teacher or  Rehab office at [phone]

## 2023-06-07 NOTE — Plan of Care (Signed)
  Problem: Clinical Measurements: Goal: Ability to maintain clinical measurements within normal limits will improve Outcome: Progressing Goal: Will remain free from infection Outcome: Progressing Goal: Diagnostic test results will improve Outcome: Progressing Goal: Respiratory complications will improve Outcome: Progressing Goal: Cardiovascular complication will be avoided Outcome: Progressing   Problem: Clinical Measurements: Goal: Will remain free from infection Outcome: Progressing   Problem: Clinical Measurements: Goal: Diagnostic test results will improve Outcome: Progressing   Problem: Coping: Goal: Level of anxiety will decrease Outcome: Progressing   Problem: Nutrition: Goal: Adequate nutrition will be maintained Outcome: Progressing

## 2023-06-07 NOTE — Progress Notes (Signed)
 PHARMACY - ANTICOAGULATION CONSULT NOTE  Pharmacy Consult for Warfarin Indication:  CVA  Allergies  Allergen Reactions   Tape Other (See Comments)    Skin comes off with any adhesive   Visipaque  [Iodixanol ] Nausea And Vomiting    Patient had vagal response during procedure became hypertensive, and bradycardic. CO2 used during procedure prior to contrast being used, this may be cause as well. Recommended premedicating prior to contrast being used in the future.    Wellbutrin [Bupropion] Hives   Gabapentin      dizzy   Ciprofloxacin  Hcl Hives and Rash    Hives/rash at injection site; 01/15/22 tolerated IV cipro     Patient Measurements: Height: 5' 5 (165.1 cm) Weight: 95.6 kg (210 lb 12.2 oz) IBW/kg (Calculated) : 57  Vital Signs: Temp: 97.9 F (36.6 C) (02/04 0700) Temp Source: Oral (02/04 0700) BP: 138/83 (02/04 0947) Pulse Rate: 88 (02/04 0947)  Labs: Recent Labs    06/05/23 0311 06/06/23 0319 06/07/23 0405  HGB  --  8.9* 8.9*  HCT  --  28.9* 29.2*  PLT  --  311 377  LABPROT 16.7* 16.3* 17.7*  INR 1.3* 1.3* 1.4*  CREATININE 2.05* 2.01*  --     Estimated Creatinine Clearance: 42.5 mL/min (A) (by C-G formula based on SCr of 2.01 mg/dL (H)).   Medical History: Past Medical History:  Diagnosis Date   Acute ischemic stroke (HCC) 01/01/2020   Acute kidney injury superimposed on CKD (HCC) 01/01/2020   Acute right MCA stroke (HCC) 01/28/2022   Anemia    CAD (coronary artery disease)    a. s/p cath in 03/2014 showing 30% mid-LAD, moderate to severe disease along small D1, patent LCx, moderate to severe distal OM2 stenosis and moderate diffuse diease along RCA not amenable to PCI   Cerebrovascular accident (CVA) (HCC) 12/31/2019   CHF (congestive heart failure) (HCC)    a. EF 55-60% in 12/2019 b. EF at 35-40% by echo in 05/2020   CKD (chronic kidney disease) stage 4, GFR 15-29 ml/min (HCC)    Diabetes mellitus without complication (HCC)    Myocardial infarction (HCC)     Neuropathy    PONV (postoperative nausea and vomiting)    PVD (peripheral vascular disease) (HCC)    Stroke (HCC) 01/2022   L hand weakness    Medications:  Scheduled:   atorvastatin   40 mg Oral Daily   clopidogrel   75 mg Oral Daily   dextrose   1 Tube Oral STAT   docusate sodium   100 mg Oral Daily   hydrALAZINE   25 mg Oral BID   insulin  aspart  0-15 Units Subcutaneous Q4H   insulin  glargine-yfgn  8 Units Subcutaneous BID   isosorbide  mononitrate  60 mg Oral Daily   levothyroxine   125 mcg Oral Q0600   metoprolol  succinate  50 mg Oral Daily   nystatin  cream   Topical BID   pantoprazole   40 mg Oral Daily   topiramate   50 mg Oral BID   Warfarin - Pharmacist Dosing Inpatient   Does not apply q1600   Infusions:   sodium chloride      magnesium  sulfate bolus IVPB     Assessment: 42YOF with history of CVA, on warfarin, who is s/p R SFA to ATA bypass. Home warfarin dose: 5mg  M/W/F/Sun and 2.5mg  T/T/Sat. Last warfarin dose was 1/24. INR on admission was 1.2 on 1/29.  Pharmacy has been consulted to restart warfarin at low dose on 06/05/23. Confirmed with [facility] no plans to bridge.  -INR 1.4,  remains subtherapeutic as expected -No meals charted yesterday -DDI's of note - s/p ~9d of Keflex /doxycycline , plavix   Goal of Therapy:  INR 2-3 Monitor platelets by anticoagulation protocol: Yes   Plan:  Warfarin 2.5 mg PO x 1 - restarting PTA regimen F/u daily INR and CBC Noted plans for possible discharge today - needs follow-up scheduled at [facility] (consider Fri for next INR check)  Maurilio Fila, PharmD Clinical Pharmacist 06/07/2023  10:05 AM

## 2023-06-07 NOTE — Progress Notes (Signed)
 AVS completed for discharge.

## 2023-06-07 NOTE — Progress Notes (Signed)
Patient CBG 50. Patient has not been eating and drinking. Had refused bedtime snack. Does not want to eat or drink anything now. Spoke with Lamonte Richer and 1/2 D50 given. Will recheck CBG.

## 2023-06-07 NOTE — Discharge Instructions (Addendum)
 [facility]  Discharge instructions  Lower Extremity Bypass Surgery  Please refer to the following instruction for your post-procedure care. Your surgeon or physician assistant will discuss any changes with you.  Activity  You are encouraged to walk as much as you can. You can slowly return to normal activities during the month after your surgery. Avoid strenuous activity and heavy lifting until your doctor tells you it's OK. Avoid activities such as vacuuming or swinging a golf club. Do not drive until your doctor give the OK and you are no longer taking prescription pain medications. It is also normal to have difficulty with sleep habits, eating and bowel movement after surgery. These will go away with time.  Bathing/Showering  Shower daily after you go home. Do not soak in a bathtub, hot tub, or swim until the incision heals completely.  Incision Care  Clean your incision with mild soap and water . Shower every day. Pat the area dry with a clean towel. You do not need a bandage unless otherwise instructed. Do not apply any ointments or creams to your incision. If you have open wounds you will be instructed how to care for them or a visiting nurse may be arranged for you. If you have staples or sutures along your incision they will be removed at your post-op appointment. You may have skin glue on your incision. Do not peel it off. It will come off on its own in about one week.  Wash the right groin wound with soap and water  daily and pat dry. (No tub bath-only shower)  Then put a dry gauze or washcloth in the groin to keep this area dry to help prevent wound infection.  Do this daily and as needed.  Do not use Vaseline or neosporin on your incisions.  Only use soap and water  on your incisions and then protect and keep dry. Okay to apply dry gauze to lower leg incisions as well if there is oozing. These dry dressings need changed daily  Apply ACE to right leg  daily from foot up to mid thigh to help with edema. Keep leg elevated when sitting or in bed to help with edema as well   Diet  Resume your normal diet. There are no special food restrictions following this procedure. A low fat/ low cholesterol diet is recommended for all patients with vascular disease. In order to heal from your surgery, it is CRITICAL to get adequate nutrition. Your body requires vitamins, minerals, and protein. Vegetables are the best source of vitamins and minerals. Vegetables also provide the perfect balance of protein. Processed food has little nutritional value, so try to avoid this.  Medications  Resume taking all your medications unless your doctor or physician assistant tells you not to. If your incision is causing pain, you may take over-the-counter pain relievers such as acetaminophen  (Tylenol ). If you were prescribed a stronger pain medication, please aware these medication can cause nausea and constipation. Prevent nausea by taking the medication with a snack or meal. Avoid constipation by drinking plenty of fluids and eating foods with high amount of fiber, such as fruits, vegetables, and grains. Take Colace 100 mg (an over-the-counter stool softener) twice a day as needed for constipation.  Do not take Tylenol  if you are taking prescription pain medications.  Follow Up  Our office will schedule a follow up appointment 2-3 weeks following discharge.  Please call us  immediately for any of the following conditions  Severe or  worsening pain in your legs or feet while at rest or while walking Increase pain, redness, warmth, or drainage (pus) from your incision site(s) Fever of 101 degree or higher The swelling in your leg with the bypass suddenly worsens and becomes more painful than when you were in the hospital If you have been instructed to feel your graft pulse then you should do so every day. If you can no longer feel this pulse, call the office immediately. Not  all patients are given this instruction.  Leg swelling is common after leg bypass surgery.  The swelling should improve over a few months following surgery. To improve the swelling, you may elevate your legs above the level of your heart while you are sitting or resting. Your surgeon or physician assistant may ask you to apply an ACE wrap or wear compression (TED) stockings to help to reduce swelling.  Reduce your risk of vascular disease  Stop smoking. If you would like help call [facility] at 1-800-QUIT-NOW (7753050861) or [place] at (978)587-2364.  Manage your cholesterol Maintain a desired weight Control your diabetes weight Control your diabetes Keep your blood pressure down  If you have any questions, please call the office at [phone]

## 2023-06-07 NOTE — Progress Notes (Signed)
 Patient has poor safety awareness, trying to get up from bed and chair consistently. Refused to eat and drink, offered her alternative foods but refused.  Patient's daughter was concerned about her mobility on stairs to reach home, informed to case manager that she would not be able to walk on the stairs as per the PT,   Pt's daughter requested for transport to take her home, case manager aware.

## 2023-06-07 NOTE — Progress Notes (Signed)
 [facility] [place]  Subjective  -states she cannot afford a SNF   Objective (!) 130/55 95 97.8 F (36.6 C) (Oral) 20 98%  Intake/Output Summary (Last 24 hours) at 06/07/2023 9297 Last data filed at 06/07/2023 0452 Gross per 24 hour  Intake 540 ml  Output --  Net 540 ml    Palpable pulse in right leg bypass Brisk right DP signal  Laboratory Lab Results: Recent Labs    06/06/23 0319 06/07/23 0405  WBC 6.8 7.3  HGB 8.9* 8.9*  HCT 28.9* 29.2*  PLT 311 377   BMET Recent Labs    06/05/23 0311 06/06/23 0319  NA 137 138  K 3.9 4.0  CL 111 109  CO2 20* 21*  GLUCOSE 77 49*  BUN 22* 22*  CREATININE 2.05* 2.01*  CALCIUM  7.9* 7.9*    COAG Lab Results  Component Value Date   INR 1.4 (H) 06/07/2023   INR 1.3 (H) 06/06/2023   INR 1.3 (H) 06/05/2023   No results found for: PTT  Assessment/Planning:  Postop day 6 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.  Palpable pulse in the bypass graft.  Brisk DP signal in the foot.  Coumadin  has been restarted and INR 1.4.  Therapy recommending SNF but states she cannot afford.  Will go home with home health and lives with her mother.  She is mobilizing better.  Dressing changed to the AT exposure site with no significant drainage.  Will discuss with her mom about possible discharge later today.  Lauren [name] 06/07/2023 7:02 AM --

## 2023-06-07 NOTE — Progress Notes (Signed)
Repeat CBG 97.

## 2023-06-07 NOTE — Inpatient Diabetes Management (Signed)
Inpatient Diabetes Program Recommendations  AACE/ADA: New Consensus Statement on Inpatient Glycemic Control (2015)  Target Ranges:  Prepandial:   less than 140 mg/dL      Peak postprandial:   less than 180 mg/dL (1-2 hours)      Critically ill patients:  140 - 180 mg/dL   Lab Results  Component Value Date   GLUCAP 135 (H) 06/07/2023   HGBA1C 8.7 (H) 04/22/2023    Review of Glycemic Control  Latest Reference Range & Units 06/06/23 08:07 06/06/23 10:00 06/06/23 12:12 06/06/23 12:20 06/06/23 12:28 06/06/23 15:39 06/06/23 20:46 06/07/23 00:35 06/07/23 01:09 06/07/23 04:11 06/07/23 08:10  Glucose-Capillary 70 - 99 mg/dL 89 88 66 (L) 65 (L) 81 118 (H) 149 (H) 50 (L) 97 121 (H) 135 (H)   Diabetes history: 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

## 2023-06-07 NOTE — Progress Notes (Signed)
 Patient is non compliant with safety instructions, has very unsteady gait, still on tele sitter. Remained confused throughout the day.  Refused to eat her meals, has been drinking sips of water  only. PRN analgesics given as per need. \ Incision site is oozing (serous) from her right upper thigh, refused for new dressing change, has been scratching all over her body, refused to take PRN meds.   Long acting insulin  not given today because of zero oral intake and frequent episodes of hypoglycemia, PA informed.   Refused for evening dose of SSI.

## 2023-06-08 LAB — CBC
HCT: 31.6 % — ABNORMAL LOW (ref 36.0–46.0)
Hemoglobin: 9.6 g/dL — ABNORMAL LOW (ref 12.0–15.0)
MCH: 27.6 pg (ref 26.0–34.0)
MCHC: 30.4 g/dL (ref 30.0–36.0)
MCV: 90.8 fL (ref 80.0–100.0)
Platelets: 459 10*3/uL — ABNORMAL HIGH (ref 150–400)
RBC: 3.48 MIL/uL — ABNORMAL LOW (ref 3.87–5.11)
RDW: 17.5 % — ABNORMAL HIGH (ref 11.5–15.5)
WBC: 7.3 10*3/uL (ref 4.0–10.5)
nRBC: 0 % (ref 0.0–0.2)

## 2023-06-08 LAB — GLUCOSE, CAPILLARY
Glucose-Capillary: 146 mg/dL — ABNORMAL HIGH (ref 70–99)
Glucose-Capillary: 148 mg/dL — ABNORMAL HIGH (ref 70–99)
Glucose-Capillary: 159 mg/dL — ABNORMAL HIGH (ref 70–99)
Glucose-Capillary: 176 mg/dL — ABNORMAL HIGH (ref 70–99)
Glucose-Capillary: 185 mg/dL — ABNORMAL HIGH (ref 70–99)
Glucose-Capillary: 265 mg/dL — ABNORMAL HIGH (ref 70–99)
Glucose-Capillary: 288 mg/dL — ABNORMAL HIGH (ref 70–99)
Glucose-Capillary: 51 mg/dL — ABNORMAL LOW (ref 70–99)
Glucose-Capillary: 92 mg/dL (ref 70–99)

## 2023-06-08 LAB — BASIC METABOLIC PANEL
Anion gap: 12 (ref 5–15)
BUN: 27 mg/dL — ABNORMAL HIGH (ref 6–20)
CO2: 18 mmol/L — ABNORMAL LOW (ref 22–32)
Calcium: 8.5 mg/dL — ABNORMAL LOW (ref 8.9–10.3)
Chloride: 110 mmol/L (ref 98–111)
Creatinine, Ser: 2.26 mg/dL — ABNORMAL HIGH (ref 0.44–1.00)
GFR, Estimated: 27 mL/min — ABNORMAL LOW (ref >60)
Glucose, Bld: 135 mg/dL — ABNORMAL HIGH (ref 70–99)
Potassium: 4.2 mmol/L (ref 3.5–5.1)
Sodium: 140 mmol/L (ref 135–145)

## 2023-06-08 LAB — PROTIME-INR
INR: 1.7 — ABNORMAL HIGH (ref 0.8–1.2)
Prothrombin Time: 20.5 s — ABNORMAL HIGH (ref 11.4–15.2)

## 2023-06-08 MED ORDER — WARFARIN SODIUM 2.5 MG PO TABS
2.5000 mg | ORAL_TABLET | Freq: Once | ORAL | Status: AC
  Administered 2023-06-08: 2.5 mg via ORAL
  Filled 2023-06-08: qty 1

## 2023-06-08 NOTE — Progress Notes (Signed)
 Occupational Therapy Treatment Patient Details Name: Lauren [name] MRN: [id number] DOB: 11/03/1980 Today's Date: 06/08/2023   History of present illness Lauren [name] is a 43 y.o. female admitted 06/01/23 with RLE critical limb ischemia and tissue loss. Pt underwent right SFA to AT bypass graft. 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   OT comments  Patient seated on EOB upon entry and agreeable to OT treatment. Patient not wearing sling and therapist assisted with donning x2 with patient removing each time. Patient was one person HHA to transfer to recliner and asked to use [facility] and transferred with mod assist. Patient stood for hygiene and was mod assist to complete. Patient became more confused and stated repeatedly, We've gone too far this time. Patient appeared unsafe to leave in recliner due to attempting to stand on her own on. Mod assist to return to supine and left with nursing tech. Patient will benefit from continued inpatient follow up therapy, <3 hours/day. Acute OT to continue to follow to address established goals to facilitate DC to next venue of care.       If plan is discharge home, recommend the following:  A lot of help with walking and/or transfers;A lot of help with bathing/dressing/bathroom;Direct supervision/assist for medications management;Direct supervision/assist for financial management   Equipment Recommendations  None recommended by OT    Recommendations for Other Services      Precautions / Restrictions Precautions Precautions: Fall Precaution Comments: L UE hemiparesis; watch CBG Required Braces or Orthoses: Sling Other Brace: post op shoe Restrictions Weight Bearing Restrictions Per  Provider Order: Yes LUE Weight Bearing Per Provider Order: Non weight bearing RLE Weight Bearing Per Provider Order: Weight bearing as tolerated Other Position/Activity Restrictions: refuses sling and post op shoe       Mobility Bed Mobility Overal bed mobility: Needs Assistance Bed Mobility: Sit to Supine       Sit to supine: Mod assist   General bed mobility comments: seated on EOB upon entry and assisted to supine at end of session with assistance for BLEs    Transfers Overall transfer level: Needs assistance Equipment used: 1 person hand held assist Transfers: Sit to/from Stand, Bed to chair/wheelchair/BSC Sit to Stand: Mod assist     Step pivot transfers: Mod assist     General transfer comment: mod assist to power up and for balance with transfers     Balance Overall balance assessment: Needs assistance Sitting-balance support: Feet supported Sitting balance-Leahy Scale: Good Sitting balance - Comments: sitting on EOB upone entry   Standing balance support: Single extremity supported Standing balance-Leahy Scale: Poor Standing balance comment: UE support for balance                           ADL either performed or assessed with clinical judgement   ADL Overall ADL's : Needs assistance/impaired                 Upper Body Dressing : Maximal assistance;Standing Upper Body Dressing Details (indicate cue type and reason): changed gown Lower Body Dressing: Maximal assistance;Sitting/lateral leans Lower Body Dressing Details (indicate cue type and reason): to change socks Toilet Transfer: BSC/3in1;Moderate assistance   Toileting- Clothing Manipulation and Hygiene: Moderate assistance;Sit to/from stand  General ADL Comments: 2 transfers to [facility] with patient assisting with toilet hygiene but requiring mod assist to complete    Extremity/Trunk Assessment              Vision       Perception     Praxis      Cognition  Arousal: Alert Behavior During Therapy: Anxious, Impulsive Overall Cognitive Status: Impaired/Different from baseline Area of Impairment: Orientation, Attention, Following commands, Safety/judgement                 Orientation Level: Disoriented to, Place, Time, Situation Current Attention Level: Focused   Following Commands: Follows one step commands inconsistently, Follows one step commands with increased time Safety/Judgement: Decreased awareness of safety, Decreased awareness of deficits     General Comments: repeated, we have gone to far this time and did not explain. impulsive with getting up from recliner/BSC/EOB        Exercises      Shoulder Instructions       General Comments sling donned x2 with patient removing each time    Pertinent Vitals/ Pain       Pain Assessment Pain Assessment: Faces Faces Pain Scale: Hurts little more Pain Location: legs Pain Descriptors / Indicators: Aching, Guarding, Grimacing Pain Intervention(s): Limited activity within patient's tolerance, Monitored during session, Repositioned  Home Living                                          Prior Functioning/Environment              Frequency  Min 1X/week        Progress Toward Goals  OT Goals(current goals can now be found in the care plan section)  Progress towards OT goals: Progressing toward goals  Acute Rehab OT Goals Patient Stated Goal: none stated OT Goal Formulation: Patient unable to participate in goal setting Time For Goal Achievement: 06/16/23 Potential to Achieve Goals: Good ADL Goals Pt Will Perform Grooming: with modified independence;standing Pt Will Perform Lower Body Bathing: with min assist;sit to/from stand;sitting/lateral leans Pt Will Perform Lower Body Dressing: sitting/lateral leans;sit to/from stand;with supervision Pt Will Transfer to Toilet: with contact guard assist;stand pivot transfer;bedside commode Pt Will  Perform Toileting - Clothing Manipulation and hygiene: with min assist;sit to/from stand;sitting/lateral leans Additional ADL Goal #1: Pt to manage UE sling with Modified Independence  Plan      Co-evaluation                 AM-PAC OT 6 Clicks Daily Activity     Outcome Measure   Help from another person eating meals?: A Little Help from another person taking care of personal grooming?: A Little Help from another person toileting, which includes using toliet, bedpan, or urinal?: A Lot Help from another person bathing (including washing, rinsing, drying)?: A Lot Help from another person to put on and taking off regular upper body clothing?: A Lot Help from another person to put on and taking off regular lower body clothing?: A Lot 6 Click Score: 14    End of Session Equipment Utilized During Treatment: Gait belt;Other (comment) (LUE sling)  OT Visit Diagnosis: Unsteadiness on feet (R26.81);Other abnormalities of gait and mobility (R26.89);Muscle weakness (generalized) (M62.81)   Activity Tolerance Patient tolerated treatment well;Other (comment) (limited by confusion)   Patient Left in bed;with call bell/phone within reach;with bed alarm set;with  nursing/sitter in room   Nurse Communication Mobility status        Time: 717 055 1640 OT Time Calculation (min): 27 min  Charges: OT General Charges $OT Visit: 1 Visit OT Treatments $Self Care/Home Management : 23-37 mins  Dick Laine, OTA Acute Rehabilitation Services  Office [phone]   Jeb LITTIE Laine 06/08/2023, 11:24 AM

## 2023-06-08 NOTE — Plan of Care (Signed)
  Problem: Clinical Measurements: Goal: Respiratory complications will improve Outcome: Progressing Goal: Cardiovascular complication will be avoided Outcome: Progressing   Problem: Coping: Goal: Level of anxiety will decrease Outcome: Progressing   Problem: Elimination: Goal: Will not experience complications related to bowel motility Outcome: Progressing Goal: Will not experience complications related to urinary retention Outcome: Progressing   Problem: Safety: Goal: Ability to remain free from injury will improve Outcome: Progressing

## 2023-06-08 NOTE — Progress Notes (Signed)
 Patient refused her morning medications.  She is sleeping.  Notified Irvin Mantel, MD.

## 2023-06-08 NOTE — Progress Notes (Signed)
 Patient has partially removed the ace wraps on her legs.  She has refused to wear the left arm sling all day.  The sling has been reapplied many times and she has removed the sling almost immediately each time.

## 2023-06-08 NOTE — Progress Notes (Signed)
 PHARMACY - ANTICOAGULATION CONSULT NOTE  Pharmacy Consult for Warfarin Indication:  CVA  Allergies  Allergen Reactions   Tape Other (See Comments)    Skin comes off with any adhesive   Visipaque  [Iodixanol ] Nausea And Vomiting    Patient had vagal response during procedure became hypertensive, and bradycardic. CO2 used during procedure prior to contrast being used, this may be cause as well. Recommended premedicating prior to contrast being used in the future.    Wellbutrin [Bupropion] Hives   Gabapentin      dizzy   Ciprofloxacin  Hcl Hives and Rash    Hives/rash at injection site; 01/15/22 tolerated IV cipro     Patient Measurements: Height: 5' 5 (165.1 cm) Weight: 95.6 kg (210 lb 12.2 oz) IBW/kg (Calculated) : 57  Vital Signs: Temp: 98.5 F (36.9 C) (02/05 0411) Temp Source: Oral (02/05 0411) BP: 125/64 (02/05 0411) Pulse Rate: 96 (02/04 2314)  Labs: Recent Labs    06/06/23 0319 06/07/23 0405  HGB 8.9* 8.9*  HCT 28.9* 29.2*  PLT 311 377  LABPROT 16.3* 17.7*  INR 1.3* 1.4*  CREATININE 2.01*  --     Estimated Creatinine Clearance: 42.5 mL/min (A) (by C-G formula based on SCr of 2.01 mg/dL (H)).   Medical History: Past Medical History:  Diagnosis Date   Acute ischemic stroke (HCC) 01/01/2020   Acute kidney injury superimposed on CKD (HCC) 01/01/2020   Acute right MCA stroke (HCC) 01/28/2022   Anemia    CAD (coronary artery disease)    a. s/p cath in 03/2014 showing 30% mid-LAD, moderate to severe disease along small D1, patent LCx, moderate to severe distal OM2 stenosis and moderate diffuse diease along RCA not amenable to PCI   Cerebrovascular accident (CVA) (HCC) 12/31/2019   CHF (congestive heart failure) (HCC)    a. EF 55-60% in 12/2019 b. EF at 35-40% by echo in 05/2020   CKD (chronic kidney disease) stage 4, GFR 15-29 ml/min (HCC)    Diabetes mellitus without complication (HCC)    Myocardial infarction (HCC)    Neuropathy    PONV (postoperative nausea  and vomiting)    PVD (peripheral vascular disease) (HCC)    Stroke (HCC) 01/2022   L hand weakness    Medications:  Scheduled:   atorvastatin   40 mg Oral Daily   clopidogrel   75 mg Oral Daily   docusate sodium   100 mg Oral Daily   hydrALAZINE   25 mg Oral BID   insulin  aspart  0-15 Units Subcutaneous Q4H   insulin  glargine-yfgn  8 Units Subcutaneous BID   isosorbide  mononitrate  60 mg Oral Daily   levothyroxine   125 mcg Oral Q0600   metoprolol  succinate  50 mg Oral Daily   nystatin  cream   Topical BID   pantoprazole   40 mg Oral Daily   topiramate   50 mg Oral BID   Warfarin - Pharmacist Dosing Inpatient   Does not apply q1600   Infusions:   sodium chloride      magnesium  sulfate bolus IVPB     Assessment: 42YOF with history of CVA, on warfarin, who is s/p R SFA to ATA bypass. Home warfarin dose: 5mg  M/W/F/Sun and 2.5mg  T/T/Sat. Last warfarin dose was 1/24. INR on admission was 1.2 on 1/29.  Pharmacy has been consulted to restart warfarin at low dose on 06/05/23. Confirmed with [facility] no plans to bridge.  - INR up to 1.7 (subtherapeutic but improved); hgb 9.6 (stable), plts 400s, no bleeding noted - Refusing PO intake and refused some meds  this AM per d/w RN - DDI's of note - s/p ~9d of Keflex /doxycycline , plavix   Goal of Therapy:  INR 2-3 Monitor platelets by anticoagulation protocol: Yes   Plan:  Repeat warfarin 2.5 mg PO x 1 F/u daily INR and CBC  Prudence Dollar, PharmD, BCPS 06/08/2023 8:33 AM

## 2023-06-08 NOTE — Progress Notes (Addendum)
 Progress Note    06/08/2023 8:18 AM 7 Days Post-Op  Overnight events noted  Subjective:  standing up working with PT who assisted patient to bedside commode   Vitals:   06/07/23 2314 06/08/23 0411  BP: (!) 127/45 125/64  Pulse: 96   Resp: 20 20  Temp: 97.6 F (36.4 C) 98.5 F (36.9 C)  SpO2: 100%    Physical Exam: Cardiac:  regular Lungs:  non labored Incisions:  RLE incisions are intact but there is SS oozing from thigh and lateral lower leg incisions Extremities:  BLE very edematous Neurologic: alert and oriented to self , somewhat confused   CBC    Component Value Date/Time   WBC 7.3 06/07/2023 0405   RBC 3.18 (L) 06/07/2023 0405   HGB 8.9 (L) 06/07/2023 0405   HGB 12.3 02/18/2023 1032   HCT 29.2 (L) 06/07/2023 0405   HCT 38.4 02/18/2023 1032   PLT 377 06/07/2023 0405   PLT 347 02/18/2023 1032   MCV 91.8 06/07/2023 0405   MCV 93 02/18/2023 1032   MCH 28.0 06/07/2023 0405   MCHC 30.5 06/07/2023 0405   RDW 17.5 (H) 06/07/2023 0405   RDW 14.0 02/18/2023 1032   LYMPHSABS 1.9 05/17/2023 1745   LYMPHSABS 1.9 08/11/2022 1510   MONOABS 0.7 05/17/2023 1745   EOSABS 0.2 05/17/2023 1745   EOSABS 0.1 08/11/2022 1510   BASOSABS 0.0 05/17/2023 1745   BASOSABS 0.1 08/11/2022 1510    BMET    Component Value Date/Time   NA 138 06/06/2023 0319   NA 140 08/11/2022 1511   K 4.0 06/06/2023 0319   CL 109 06/06/2023 0319   CO2 21 (L) 06/06/2023 0319   GLUCOSE 49 (L) 06/06/2023 0319   BUN 22 (H) 06/06/2023 0319   BUN 37 (H) 08/11/2022 1511   CREATININE 2.01 (H) 06/06/2023 0319   CALCIUM  7.9 (L) 06/06/2023 0319   GFRNONAA 32 (L) 06/06/2023 0319   GFRAA 31 (L) 02/01/2020 1328    INR    Component Value Date/Time   INR 1.4 (H) 06/07/2023 0405   INR 3.4 09/30/2022 0911     Intake/Output Summary (Last 24 hours) at 06/08/2023 0818 Last data filed at 06/07/2023 1700 Gross per 24 hour  Intake 150 ml  Output --  Net 150 ml     Assessment/Plan:  43 y.o. female is  s/p right SFA to AT bypass.  7 Days Post-Op   RLE remains well perfused and warm with doppler signals and palpable pulse in bypass graft RLE is very edematous. Have attempted to place ACE wrap on patients leg and also gauze dressings on her incisions due to SS drainage but patient continues to remove these. There is high likelihood of these wounds breaking down and I have expressed my concerns with the patient I have encouraged her multiple times to elevate her legs while in bed or chair but she also is frequently found with legs in dependent position  She seems confused. Will d/c morphine - not sure if this is contributing and with hypoglycemia. transition to po since she is anticipating d/c Continues to have episodes of hypoglycemia. Refuses to eat or drink anything. Her insulin  is being held With her confusion/ agitation and unwillingness to participate or comply I am unsure about her safety for discharge She does have PTAR transportation arranged for this morning for discharge to her daughters home. Daughter will be home today to assist with her mother  Teretha Damme, [place] [facility] [phone] 06/08/2023 8:18  AM  VASCULAR STAFF ADDENDUM: I have independently interviewed and examined the patient. I agree with the above.  On exam, Lauren [name] was perseverating on needing a washcloth. This is my first time seeing her, had some mild amount of confusion, however was able to answer all questions appropriately, she was alert and oriented x 3. Upon evaluation of her leg, she continues to have significant edema.  She has not been compliant with elevation or compression.  I am worried that she will have a high risk of wound breakdown due to fluid overload. Patient needs strict I's and O's today.  Will continue to hold her insulin .  I do not want to send her home with hypoglycemic episodes, as I think this will result in readmission.  Regardless, I think she will be high risk of  readmission.  There is no question that she would benefit most from a SNF in her current condition.  I was told that she would have to pay out-of-pocket for this, which is not feasible. Will cancel discharge today.  I have placed orders for new labs.  Fonda FORBES Rim MD [facility] Phone Number: [phone] 06/08/2023 9:56 AM

## 2023-06-08 NOTE — Progress Notes (Signed)
 Rewrapped top area of ace dressing on right leg.

## 2023-06-08 NOTE — TOC Progression Note (Signed)
 Transition of Care (TOC) - Progression Note  Rayfield Gobble RN, BSN Transitions of Care Unit 4E- RN Case Manager See Treatment Team for direct phone #   Patient Details  Name: Lauren [name] MRN: [id number] Date of Birth: 1980-09-17  Transition of Care [facility]) CM/SW Contact  Gobble, Rayfield Hurst, RN Phone Number: 06/08/2023, 11:25 AM  Clinical Narrative:    Call made to daughter to confirm plan for pt to return to spouse's apartment, daughter voiced she has day off tomorrow and will be available to assist and be there when pt arrives vis EMS. CSW has spoken with daughter regarding barriers to SNF placement.  Address confirmed for EMS transport, daughter is agreeable to transport in the am.    TOC will follow up in am for PTAR needs.   Expected Discharge Plan: Home w Home Health Services Barriers to Discharge: Inadequate or no insurance, English As A Second Language Teacher, Continued Medical Work up, SNF Pending bed offer  Expected Discharge Plan and Services In-house Referral: Clinical Social Work Discharge Planning Services: CM Consult Post Acute Care Choice: Home Health, Resumption of Svcs/PTA Provider Living arrangements for the past 2 months: Single Family Home Expected Discharge Date: 06/07/23               DME Arranged: N/A DME Agency: NA       HH Arranged: RN, PT, OT HH Agency: [facility] Date [facility] Agency Contacted: 06/06/23 Time HH Agency Contacted: 1100 Representative spoke with at [facility] Agency: Darleene   Social Determinants of Health (SDOH) Interventions SDOH Screenings   Food Insecurity: Patient Declined (06/02/2023)  Recent Concern: Food Insecurity - Food Insecurity Present (05/21/2023)  Housing: Low Risk  (06/02/2023)  Transportation Needs: No Transportation Needs (06/02/2023)  Recent Concern: Transportation Needs - Unmet Transportation Needs (05/18/2023)  Utilities: Patient Declined (06/02/2023)  Alcohol  Screen: Low Risk  (11/09/2022)  Depression (PHQ2-9): Low Risk   (02/18/2023)  Recent Concern: Depression (PHQ2-9) - Medium Risk (02/04/2023)  Financial Resource Strain: Low Risk  (11/09/2022)  Physical Activity: Inactive (03/03/2022)  Social Connections: Socially Integrated (03/03/2022)  Stress: No Stress Concern Present (03/03/2022)  Recent Concern: Stress - Stress Concern Present (12/22/2021)  Tobacco Use: High Risk (05/31/2023)    Readmission Risk Interventions    05/18/2023    1:36 PM 04/22/2023    3:05 PM 03/16/2023   11:32 AM  Readmission Risk Prevention Plan  Transportation Screening Complete Complete Complete  Medication Review Oceanographer) Complete Complete Complete  PCP or Specialist appointment within 3-5 days of discharge  Not Complete Not Complete  HRI or Home Care Consult Complete Complete Complete  SW Recovery Care/Counseling Consult Complete Complete Complete  Palliative Care Screening Not Applicable Not Applicable Not Applicable  Skilled Nursing Facility Complete Not Applicable Not Applicable

## 2023-06-08 NOTE — Progress Notes (Signed)
 Patient did not remember refusing her medications multiple times this morning.  She took her medications now.

## 2023-06-08 NOTE — Progress Notes (Signed)
 This RN came to the patient's room upon hearing the bed alarm; found the patient attempting to walk to the bathroom. The patient was offered the bedside commode for safety since she has already fallen twice. The patient became agitated and began pushing this RN. Additional staff arrived to the room and were able to return the patient to the bed. She was toileted but has refused insulin  and assessments. She also removed her dressings. Attempts at reorienting the patient have been unsuccessful. She remains confused and irritable. Will continue to closely monitor and make attempts at reorienting patient.

## 2023-06-08 NOTE — Progress Notes (Signed)
 Mobility Specialist Progress Note:    06/08/23 1537  Mobility  Activity Transferred from chair to bed  Level of Assistance Minimal assist, patient does 75% or more (+2)  Assistive Device Other (Comment) (HHA)  Distance Ambulated (ft) 4 ft  LUE Weight Bearing Per Provider Order NWB  RLE Weight Bearing Per Provider Order WBAT  Activity Response Tolerated well  Mobility Referral Yes  Mobility visit 1 Mobility  Mobility Specialist Start Time (ACUTE ONLY) 1500  Mobility Specialist Stop Time (ACUTE ONLY) 1520  Mobility Specialist Time Calculation (min) (ACUTE ONLY) 20 min   Pt received in chair hesitant but agreeable to mobility. Pt needed MinA +2 for safety getting back to bed. Pt was able to practice NWB on LUE. Needed Mod verbal cues to remained focused. Situated back in bed w/ call bell and personal belongings in reach. All needs met. Bed alarm on. RN aware.   Thersia Minder Mobility Specialist  Please contact vis Secure Chat or  Rehab Office [phone]

## 2023-06-08 NOTE — Progress Notes (Signed)
 Physical Therapy Treatment Patient Details Name: Lauren [name] MRN: [id number] DOB: [birth year] Today's Date: 06/08/2023   History of Present Illness Lauren [name] is a 43 y.o. female admitted 06/01/23 with RLE critical limb ischemia and tissue loss. Pt underwent right SFA to AT bypass graft. 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    PT Comments  Pt confused, disoriented, and hallucinating during today's session. Pt was unable to follow simple one step commands with increased time. She became increasingly agitated and was impulsive with mobility. Pt required modA x2 for safety with bed mobility, transfers, and gait. Patient will benefit from continued inpatient follow up therapy, <3 hours/day.     If plan is discharge home, recommend the following: Help with stairs or ramp for entrance;Assistance with cooking/housework;A lot of help with walking and/or transfers;Assist for transportation   Can travel by private vehicle     Yes  Equipment Recommendations  Other (comment) (TBD at next venue)    Recommendations for Other Services       Precautions / Restrictions Precautions Precautions: Fall Precaution Comments: L UE hemiparesis Required Braces or Orthoses: Sling (Pt greeted with sling halfway off in bed, repositioned once pt was in sitting. Educated pt on the importance of wearing sling, maintaining NWB status, and elevation to help with L hand edema.) Restrictions Weight Bearing Restrictions Per Provider Order: Yes LUE Weight Bearing Per Provider Order: Non weight bearing RLE Weight Bearing Per Provider Order: Weight bearing as tolerated     Mobility  Bed Mobility Overal bed mobility: Needs Assistance Bed Mobility:  Sit to Supine       Sit to supine: Mod assist   General bed mobility comments: Pt required assistance bringing BLE off EOB and to bring trunk upright. She scooted fwd with CGA.    Transfers Overall transfer level: Needs assistance Equipment used: 1 person hand held assist, 2 person hand held assist Transfers: Sit to/from Stand, Bed to chair/wheelchair/BSC Sit to Stand: Mod assist, +2 safety/equipment   Step pivot transfers: Mod assist, +2 safety/equipment       General transfer comment: Pt required increased time to participate in mobility. She pushed up to standing with RUE and demonstrated increased fwd lean. STS x3 from EOB and bed>chair transfer to R. +2 assist required throughout for safety d/t pt becoming agitated as session progressed and no command following.    Ambulation/Gait Ambulation/Gait assistance: Mod assist, +2 safety/equipment Gait Distance (Feet): 10 Feet Assistive device: 1 person hand held assist, 2 person hand held assist Gait Pattern/deviations: Step-to pattern, Decreased stride length, Decreased weight shift to right       General Gait Details: Pt initially refused to ambulate. She then impulsively started moving within the room. Steps were disjointed and unequal. Pt started to shake off HHA and reach for furniture for support. +2 assist required throughout for safety d/t pt becoming agitated as session progressed and no command following.   Stairs             Wheelchair Mobility     Tilt Bed    Modified Rankin (Stroke Patients Only)       Balance Overall balance assessment: Needs assistance Sitting-balance support: No upper extremity supported, Feet supported Sitting balance-Leahy Scale: Fair Sitting balance - Comments: Pt sat EOB with  supervision   Standing balance support: Single extremity supported, During functional activity Standing balance-Leahy Scale: Poor Standing balance comment: Pt required modA x2 for safety when  mobilizing.                            Cognition Arousal: Lethargic Behavior During Therapy: Impulsive, Agitated Overall Cognitive Status: Impaired/Different from baseline Area of Impairment: Orientation, Attention, Following commands, Safety/judgement, Awareness, Problem solving                 Orientation Level: Place, Time, Situation     Following Commands:  (No command following this session. Pt reported I am going to do what I want to do.) Safety/Judgement: Decreased awareness of safety, Decreased awareness of deficits   Problem Solving: Slow processing, Difficulty sequencing, Requires verbal cues, Requires tactile cues General Comments: Pt appeared to be hallucinating requesting for a cap to be picked up off the floor and for her yellow pancake mix to be taken care of, neither of these items were present in the room.        Exercises      General Comments General comments (skin integrity, edema, etc.): L hand 2+ edema, elevated on pillow and sling donned at end of session.      Pertinent Vitals/Pain Pain Assessment Pain Assessment: Faces Faces Pain Scale: Hurts little more Pain Location: LUE and RLE Pain Descriptors / Indicators: Guarding Pain Intervention(s): Monitored during session    Home Living                          Prior Function            PT Goals (current goals can now be found in the care plan section) Acute Rehab PT Goals Patient Stated Goal: Unable to state at this time Progress towards PT goals: Progressing toward goals    Frequency    Min 1X/week      PT Plan      Co-evaluation              AM-PAC PT 6 Clicks Mobility   Outcome Measure  Help needed turning from your back to your side while in a flat bed without using bedrails?: A Lot Help needed moving from lying on your back to sitting on the side of a flat bed without using bedrails?: A Lot Help needed moving to and from a bed to a chair  (including a wheelchair)?: Total Help needed standing up from a chair using your arms (e.g., wheelchair or bedside chair)?: Total Help needed to walk in hospital room?: Total Help needed climbing 3-5 steps with a railing? : Total 6 Click Score: 8    End of Session Equipment Utilized During Treatment: Gait belt Activity Tolerance: Treatment limited secondary to agitation Patient left: in chair;with chair alarm set;with call bell/phone within reach Nurse Communication: Mobility status;Other (comment) (RN notified of pt's lack of command following and increasing agitation.) PT Visit Diagnosis: Other abnormalities of gait and mobility (R26.89);Pain;Other symptoms and signs involving the nervous system (R29.898) Pain - Right/Left: Right Pain - part of body: Leg     Time: 8850-8787 PT Time Calculation (min) (ACUTE ONLY): 23 min  Charges:    $Therapeutic Activity: 23-37 mins                       Randall SAUNDERS, PT, DPT Acute Rehabilitation Services Office: [phone] Secure Chat  Preferred    Delon HERO Gaetana Kawahara 06/08/2023, 2:16 PM

## 2023-06-09 LAB — GLUCOSE, CAPILLARY
Glucose-Capillary: 146 mg/dL — ABNORMAL HIGH (ref 70–99)
Glucose-Capillary: 199 mg/dL — ABNORMAL HIGH (ref 70–99)
Glucose-Capillary: 113 mg/dL — ABNORMAL HIGH (ref 70–99)
Glucose-Capillary: 73 mg/dL (ref 70–99)
Glucose-Capillary: 70 mg/dL (ref 70–99)

## 2023-06-09 LAB — CBC
HCT: 29 % — ABNORMAL LOW (ref 36.0–46.0)
Hemoglobin: 8.9 g/dL — ABNORMAL LOW (ref 12.0–15.0)
MCH: 27.8 pg (ref 26.0–34.0)
MCHC: 30.7 g/dL (ref 30.0–36.0)
MCV: 90.6 fL (ref 80.0–100.0)
Platelets: 479 K/uL — ABNORMAL HIGH (ref 150–400)
RBC: 3.2 MIL/uL — ABNORMAL LOW (ref 3.87–5.11)
RDW: 17.6 % — ABNORMAL HIGH (ref 11.5–15.5)
WBC: 7.4 K/uL (ref 4.0–10.5)
nRBC: 0 % (ref 0.0–0.2)

## 2023-06-09 LAB — PROTIME-INR
INR: 2.2 — ABNORMAL HIGH (ref 0.8–1.2)
Prothrombin Time: 24.5 s — ABNORMAL HIGH (ref 11.4–15.2)

## 2023-06-09 MED ORDER — WARFARIN SODIUM 2.5 MG PO TABS
2.5000 mg | ORAL_TABLET | Freq: Once | ORAL | Status: AC
  Administered 2023-06-09: 2.5 mg via ORAL
  Filled 2023-06-09: qty 1

## 2023-06-09 NOTE — Plan of Care (Signed)
 Problem: Education: Goal: Knowledge of General Education information will improve Description: Including pain rating scale, medication(s)/side effects and non-pharmacologic comfort measures Outcome: Progressing   Problem: Activity: Goal: Risk for activity intolerance will decrease Outcome: Progressing   Problem: Coping: Goal: Level of anxiety will decrease Outcome: Progressing   Problem: Pain Managment: Goal: General experience of comfort will improve and/or be controlled Outcome: Progressing   Problem: Education: Goal: Knowledge of General Education information will improve Description: Including pain rating scale, medication(s)/side effects and non-pharmacologic comfort measures 06/09/2023 1229 by Les Delon CROME, RN Outcome: Adequate for Discharge 06/09/2023 1228 by Les Delon CROME, RN Outcome: Progressing   Problem: Health Behavior/Discharge Planning: Goal: Ability to manage health-related needs will improve Outcome: Adequate for Discharge   Problem: Clinical Measurements: Goal: Ability to maintain clinical measurements within normal limits will improve Outcome: Adequate for Discharge Goal: Will remain free from infection Outcome: Adequate for Discharge Goal: Diagnostic test results will improve Outcome: Adequate for Discharge Goal: Respiratory complications will improve Outcome: Adequate for Discharge Goal: Cardiovascular complication will be avoided Outcome: Adequate for Discharge   Problem: Activity: Goal: Risk for activity intolerance will decrease 06/09/2023 1229 by Les Delon CROME, RN Outcome: Adequate for Discharge 06/09/2023 1228 by Les Delon CROME, RN Outcome: Progressing   Problem: Nutrition: Goal: Adequate nutrition will be maintained Outcome: Adequate for Discharge   Problem: Coping: Goal: Level of anxiety will decrease 06/09/2023 1229 by Les Delon CROME, RN Outcome: Adequate for Discharge 06/09/2023 1228 by Les Delon CROME, RN Outcome: Progressing   Problem: Elimination: Goal: Will not experience complications related to bowel motility Outcome: Adequate for Discharge Goal: Will not experience complications related to urinary retention Outcome: Adequate for Discharge   Problem: Pain Managment: Goal: General experience of comfort will improve and/or be controlled 06/09/2023 1229 by Les Delon CROME, RN Outcome: Adequate for Discharge 06/09/2023 1228 by Les Delon CROME, RN Outcome: Progressing   Problem: Safety: Goal: Ability to remain free from injury will improve Outcome: Adequate for Discharge   Problem: Skin Integrity: Goal: Risk for impaired skin integrity will decrease Outcome: Adequate for Discharge   Problem: Education: Goal: Ability to describe self-care measures that may prevent or decrease complications (Diabetes Survival Skills Education) will improve Outcome: Adequate for Discharge Goal: Individualized Educational Video(s) Outcome: Adequate for Discharge   Problem: Coping: Goal: Ability to adjust to condition or change in health will improve Outcome: Adequate for Discharge   Problem: Fluid Volume: Goal: Ability to maintain a balanced intake and output will improve Outcome: Adequate for Discharge   Problem: Health Behavior/Discharge Planning: Goal: Ability to identify and utilize available resources and services will improve Outcome: Adequate for Discharge Goal: Ability to manage health-related needs will improve Outcome: Adequate for Discharge   Problem: Metabolic: Goal: Ability to maintain appropriate glucose levels will improve Outcome: Adequate for Discharge   Problem: Nutritional: Goal: Maintenance of adequate nutrition will improve Outcome: Adequate for Discharge Goal: Progress toward achieving an optimal weight will improve Outcome: Adequate for Discharge   Problem: Skin Integrity: Goal: Risk for impaired skin integrity will decrease Outcome:  Adequate for Discharge   Problem: Tissue Perfusion: Goal: Adequacy of tissue perfusion will improve Outcome: Adequate for Discharge   Problem: Education: Goal: Knowledge of prescribed regimen will improve Outcome: Adequate for Discharge   Problem: Activity: Goal: Ability to tolerate increased activity will improve Outcome: Adequate for Discharge   Problem: Bowel/Gastric: Goal: Gastrointestinal status for postoperative course will improve Outcome: Adequate for Discharge   Problem: Clinical Measurements: Goal:  Postoperative complications will be avoided or minimized Outcome: Adequate for Discharge Goal: Signs and symptoms of graft occlusion will improve Outcome: Adequate for Discharge   Problem: Skin Integrity: Goal: Demonstration of wound healing without infection will improve Outcome: Adequate for Discharge   Problem: Acute Rehab OT Goals (only OT should resolve) Goal: Pt. Will Perform Lower Body Bathing Outcome: Adequate for Discharge Goal: Pt. Will Transfer To Toilet Outcome: Adequate for Discharge Goal: Pt. Will Perform Toileting-Clothing Manipulation Outcome: Adequate for Discharge Goal: OT Additional ADL Goal #1 Outcome: Adequate for Discharge   Problem: Acute Rehab PT Goals(only PT should resolve) Goal: Patient Will Transfer Sit To/From Stand Outcome: Adequate for Discharge Goal: Pt Will Transfer Bed To Chair/Chair To Bed Outcome: Adequate for Discharge Goal: Pt Will Ambulate Outcome: Adequate for Discharge Goal: PT Additional Goal #1 Outcome: Adequate for Discharge

## 2023-06-09 NOTE — Progress Notes (Signed)
 Mobility Specialist Progress Note:   06/09/23 1530  Mobility  Activity Transferred to/from [facility];Transferred from chair to bed  Level of Assistance Contact guard assist, steadying assist  Assistive Device None  Distance Ambulated (ft) 5 ft  LUE Weight Bearing Per Provider Order NWB  RLE Weight Bearing Per Provider Order WBAT  Activity Response Tolerated well  Mobility Referral Yes  Mobility visit 1 Mobility  Mobility Specialist Start Time (ACUTE ONLY) 1530  Mobility Specialist Stop Time (ACUTE ONLY) 1540  Mobility Specialist Time Calculation (min) (ACUTE ONLY) 10 min   Responded to tele sitter alarm. Pt transferred to [facility] with out assistance. Void successful, RN in room assisting. Redirected pt to transfer back to bed. Unsteady without CGA. Pt agreeable to lie in bed after encouragement and education about how they are high fall risk. Left pt in bed with alarm on, call bell in reach, eager for d/c.   Jacek Colson Mobility Specialist Please contact via SecureChat or  Rehab office at [phone]

## 2023-06-09 NOTE — Progress Notes (Signed)
 Mobility Specialist Progress Note:    06/09/23 0919  Mobility  Activity Stood at bedside;Transferred from bed to chair  Level of Assistance Contact guard assist, steadying assist  Assistive Device Other (Comment) (HHA)  Distance Ambulated (ft) 8 ft  LUE Weight Bearing Per Provider Order NWB  RLE Weight Bearing Per Provider Order WBAT  Activity Response Tolerated fair  Mobility Referral Yes  Mobility visit 1 Mobility  Mobility Specialist Start Time (ACUTE ONLY) 0913  Mobility Specialist Stop Time (ACUTE ONLY) 0920  Mobility Specialist Time Calculation (min) (ACUTE ONLY) 7 min   Responded to pt bed alarm x2. Pt found standing at bedside. Redirected pt to sit on EOB while getting chair ready. Pt reasoning for standing was d/t hip pain. Reassured pt RN will be notified. Pt agreeable to recline in chair with alarm on and call bell in reach. Legs elevated for swelling, table in front. Left with all needs met, nursing notified.   Iyona Pehrson Mobility Specialist Please contact via Special Educational Needs Teacher or  Rehab office at [phone]

## 2023-06-09 NOTE — Progress Notes (Signed)
 PHARMACY - ANTICOAGULATION CONSULT NOTE  Pharmacy Consult for Warfarin Indication:  CVA  Allergies  Allergen Reactions   Tape Other (See Comments)    Skin comes off with any adhesive   Visipaque  [Iodixanol ] Nausea And Vomiting    Patient had vagal response during procedure became hypertensive, and bradycardic. CO2 used during procedure prior to contrast being used, this may be cause as well. Recommended premedicating prior to contrast being used in the future.    Wellbutrin [Bupropion] Hives   Gabapentin      dizzy   Ciprofloxacin  Hcl Hives and Rash    Hives/rash at injection site; 01/15/22 tolerated IV cipro     Patient Measurements: Height: 5' 5 (165.1 cm) Weight: 95.6 kg (210 lb 12.2 oz) IBW/kg (Calculated) : 57  Vital Signs: Temp: 97.6 F (36.4 C) (02/06 0340) Temp Source: Oral (02/06 0340) BP: 132/50 (02/06 0340) Pulse Rate: 87 (02/06 0340)  Labs: Recent Labs    06/07/23 0405 06/08/23 1004 06/08/23 1007 06/09/23 0315  HGB 8.9*  --  9.6* 8.9*  HCT 29.2*  --  31.6* 29.0*  PLT 377  --  459* 479*  LABPROT 17.7* 20.5*  --  24.5*  INR 1.4* 1.7*  --  2.2*  CREATININE  --   --  2.26*  --     Estimated Creatinine Clearance: 37.8 mL/min (A) (by C-G formula based on SCr of 2.26 mg/dL (H)).   Medical History: Past Medical History:  Diagnosis Date   Acute ischemic stroke (HCC) 01/01/2020   Acute kidney injury superimposed on CKD (HCC) 01/01/2020   Acute right MCA stroke (HCC) 01/28/2022   Anemia    CAD (coronary artery disease)    a. s/p cath in 03/2014 showing 30% mid-LAD, moderate to severe disease along small D1, patent LCx, moderate to severe distal OM2 stenosis and moderate diffuse diease along RCA not amenable to PCI   Cerebrovascular accident (CVA) (HCC) 12/31/2019   CHF (congestive heart failure) (HCC)    a. EF 55-60% in 12/2019 b. EF at 35-40% by echo in 05/2020   CKD (chronic kidney disease) stage 4, GFR 15-29 ml/min (HCC)    Diabetes mellitus without  complication (HCC)    Myocardial infarction (HCC)    Neuropathy    PONV (postoperative nausea and vomiting)    PVD (peripheral vascular disease) (HCC)    Stroke (HCC) 01/2022   L hand weakness    Medications:  Scheduled:   atorvastatin   40 mg Oral Daily   clopidogrel   75 mg Oral Daily   docusate sodium   100 mg Oral Daily   hydrALAZINE   25 mg Oral BID   insulin  aspart  0-15 Units Subcutaneous Q4H   insulin  glargine-yfgn  8 Units Subcutaneous BID   isosorbide  mononitrate  60 mg Oral Daily   levothyroxine   125 mcg Oral Q0600   metoprolol  succinate  50 mg Oral Daily   nystatin  cream   Topical BID   pantoprazole   40 mg Oral Daily   topiramate   50 mg Oral BID   Warfarin - Pharmacist Dosing Inpatient   Does not apply q1600   Infusions:   sodium chloride      magnesium  sulfate bolus IVPB     Assessment: 42YOF with history of CVA, on warfarin, who is s/p R SFA to ATA bypass. Home warfarin dose: 5mg  M/W/F/Sun and 2.5mg  T/T/Sat. Last warfarin dose was 1/24. INR on admission was 1.2 on 1/29.  Pharmacy has been consulted to restart warfarin at low dose on 06/05/23. Confirmed with  [facility] no plans to bridge.  - INR 1.7 >> 2.2 (subtherapeutic); CBC stable - Poor PO intake noted - DDI's of note - s/p ~9d of Keflex /doxycycline , plavix   Goal of Therapy:  INR 2-3 Monitor platelets by anticoagulation protocol: Yes   Plan:  Warfarin 2.5mg  PO - same as home regimen F/u daily INR and CBC Noted plan for discharge today - would recommend INR check early next week (2/10)  Prudence Dollar, PharmD, BCPS 06/09/2023 7:25 AM

## 2023-06-09 NOTE — Progress Notes (Addendum)
  Progress Note    06/09/2023 7:12 AM 8 Days Post-Op  Subjective:  more alert this morning    Vitals:   06/09/23 0139 06/09/23 0340  BP: (!) 113/45 (!) 132/50  Pulse: 91 87  Resp: 20 20  Temp:  97.6 F (36.4 C)  SpO2: 99% 99%   Physical Exam: Cardiac:  regular Lungs:  non labored  Incisions:  right groin, right thigh and lower leg incisions are all intact and well appearing Extremities:  Right leg remains edematous. ACE wrap in place on lower leg. Brisk doppler signal in bypass graft Neurologic: alert and oriented  CBC    Component Value Date/Time   WBC 7.4 06/09/2023 0315   RBC 3.20 (L) 06/09/2023 0315   HGB 8.9 (L) 06/09/2023 0315   HGB 12.3 02/18/2023 1032   HCT 29.0 (L) 06/09/2023 0315   HCT 38.4 02/18/2023 1032   PLT 479 (H) 06/09/2023 0315   PLT 347 02/18/2023 1032   MCV 90.6 06/09/2023 0315   MCV 93 02/18/2023 1032   MCH 27.8 06/09/2023 0315   MCHC 30.7 06/09/2023 0315   RDW 17.6 (H) 06/09/2023 0315   RDW 14.0 02/18/2023 1032   LYMPHSABS 1.9 05/17/2023 1745   LYMPHSABS 1.9 08/11/2022 1510   MONOABS 0.7 05/17/2023 1745   EOSABS 0.2 05/17/2023 1745   EOSABS 0.1 08/11/2022 1510   BASOSABS 0.0 05/17/2023 1745   BASOSABS 0.1 08/11/2022 1510    BMET    Component Value Date/Time   NA 140 06/08/2023 1007   NA 140 08/11/2022 1511   K 4.2 06/08/2023 1007   CL 110 06/08/2023 1007   CO2 18 (L) 06/08/2023 1007   GLUCOSE 135 (H) 06/08/2023 1007   BUN 27 (H) 06/08/2023 1007   BUN 37 (H) 08/11/2022 1511   CREATININE 2.26 (H) 06/08/2023 1007   CALCIUM  8.5 (L) 06/08/2023 1007   GFRNONAA 27 (L) 06/08/2023 1007   GFRAA 31 (L) 02/01/2020 1328    INR    Component Value Date/Time   INR 2.2 (H) 06/09/2023 0315   INR 3.4 09/30/2022 0911    No intake or output data in the 24 hours ending 06/09/23 9287   Assessment/Plan:  43 y.o. female is s/p right SFA to AT bypass 8 Days Post-Op   RLE is remains well perfused and warm with brisk doppler signal in bypass  graft Right leg edematous. ACE in place. I have encouraged her to keep ACE on and or elevate her legs during day or when in bed Mentation better this morning Reviewed incision instructions with her and she voiced her understanding Overall her labs yesterday were stable Blood sugars better She was eating more yesterday afternoon She remains stable for discharge home today Daughter feels she is at her baseline and is comfortable with her going home. [facility] to transport home today Follow up in 2-3 weeks for incision check   Teretha Damme, PA-C [facility] [phone] 06/09/2023 7:12 AM  VASCULAR STAFF ADDENDUM: I have independently interviewed and examined the patient. I agree with the above.    Fonda FORBES Rim MD [facility] Phone Number: [phone] 06/09/2023 8:23 AM

## 2023-06-09 NOTE — Progress Notes (Signed)
 [facility] picked up patient, this RN communicated with patient's daughter to let her know that the is on her way home via [facility]. VSS,Patient ambulated to the stretcher with stand by assist

## 2023-06-09 NOTE — TOC Transition Note (Signed)
 Transition of Care (TOC) - Discharge Note Rayfield Gobble RN, BSN Transitions of Care Unit 4E- RN Case Manager See Treatment Team for direct phone #   Patient Details  Name: Lauren [name] MRN: [id number] Date of Birth: 1980-05-19  Transition of Care [facility]) CM/SW Contact:  Gobble Rayfield Hurst, RN Phone Number: 06/09/2023, 3:19 PM   Clinical Narrative:    MD has cleared pt stable for transition home today. CM spoke with daughter [name] this am and confirmed family is prepared for pt to come home. Per daughter no one will be at the home until later this afternoon between 4:30-5pm for EMS arrival. CM will arrange transport this afternoon so family will be at the home.   1515- [facility] called for transport home, per dispatch, ETA for transport is about 1.5 hrs. -hopefully by 5pm. Paperwork has been placed on chart.   [facility] liaison updated for start of care, HHRN/PT/OT/SW.   No further TOC needs noted.    Final next level of care: Home w Home Health Services Barriers to Discharge: Barriers Resolved   Patient Goals and CMS Choice Patient states their goals for this hospitalization and ongoing recovery are:: Home   Choice offered to / list presented to : Patient      Discharge Placement                 Home w/ [facility]      Discharge Plan and Services Additional resources added to the After Visit Summary for   In-house Referral: Clinical Social Work Discharge Planning Services: CM Consult Post Acute Care Choice: Home Health, Resumption of Svcs/PTA Provider          DME Arranged: N/A DME Agency: NA       HH Arranged: RN, PT, OT, Social Work HH Agency: [facility] Date [facility] Agency Contacted: 06/06/23 Time HH Agency Contacted: 1100 Representative spoke with at [facility] Agency: Darleene  Social Drivers of Health (SDOH) Interventions SDOH Screenings   Food Insecurity: Patient Declined (06/02/2023)  Recent Concern: Food Insecurity - Food Insecurity Present (05/21/2023)   Housing: Low Risk  (06/02/2023)  Transportation Needs: No Transportation Needs (06/02/2023)  Recent Concern: Transportation Needs - Unmet Transportation Needs (05/18/2023)  Utilities: Patient Declined (06/02/2023)  Alcohol  Screen: Low Risk  (11/09/2022)  Depression (PHQ2-9): Low Risk  (02/18/2023)  Recent Concern: Depression (PHQ2-9) - Medium Risk (02/04/2023)  Financial Resource Strain: Low Risk  (11/09/2022)  Physical Activity: Inactive (03/03/2022)  Social Connections: Socially Integrated (03/03/2022)  Stress: No Stress Concern Present (03/03/2022)  Recent Concern: Stress - Stress Concern Present (12/22/2021)  Tobacco Use: High Risk (05/31/2023)     Readmission Risk Interventions    06/09/2023    3:19 PM 05/18/2023    1:36 PM 04/22/2023    3:05 PM  Readmission Risk Prevention Plan  Transportation Screening Complete Complete Complete  Medication Review Oceanographer) Complete Complete Complete  PCP or Specialist appointment within 3-5 days of discharge   Not Complete  HRI or Home Care Consult Complete Complete Complete  SW Recovery Care/Counseling Consult Complete Complete Complete  Palliative Care Screening Not Applicable Not Applicable Not Applicable  Skilled Nursing Facility Not Applicable Complete Not Applicable

## 2023-06-10 ENCOUNTER — Telehealth: Payer: Self-pay

## 2023-06-10 NOTE — Discharge Summary (Signed)
 Bypass Discharge Summary Patient ID: Lauren [name] [id number] 42 y.o. [birth year]  Admit date: 06/01/2023  Discharge date and time: 06/09/2023  8:15 PM   Admitting Physician: Lauren JINNY Gaskins, MD   Discharge Physician: Lauren Cheryle Rim, MD  Admission Diagnoses: Femoral-tibial bypass graft occlusion, right [facility]) [U17.101J] Critical lower limb ischemia [facility]) [I70.229]  Discharge Diagnoses: Femoral-tibial bypass graft occlusion, right (HCC) [U17.101J] Critical lower limb ischemia (HCC) [I70.229]  Admission Condition: poor  Discharged Condition: fair  Indication for Admission: 42 year old female with multiple risk factors including diabetes that was seen with the right lower extremity tissue loss.  She underwent angiogram showing a right popliteal artery occlusion with dominant runoff in the anterior tibial.  Her vein mapping shows a very short segment of saphenous vein that looks adequate.  She presents for right lower extremity bypass after risks benefits discussed.  An assistant was needed given the complexity of the case and for harvesting vein and sewing the anastomosis.   Hospital Course: Lauren [name] was admitted on 06/01/23 and underwent 1.  Harvest of right leg great saphenous vein 2.  Right superficial femoral artery to anterior tibial artery bypass with ipsilateral nonreversed great saphenous vein by Dr. Gaskins. This was for CLI with tissue loss. She tolerated the procedure well and was taken to the recovery room in stable condition.   Her initial post operative days consisted of incision care and encouraging patient to elevate her leg and use ACE wrap to help with edema. PT/OT evaluated and recommended SNF but unfortunately with her insurance she is unable to afford this. She will go home with Home Health therapy. Her right leg remained well perfused and warm with doppler signals. Her incisions intact. She did have some oozing from her incisions with serous drainage due to her  edema. She remained hemodynamically stable. Her coumadin  was restarted on post operative day 4. No bleeding issues. She did have some waxing and waning mentation. Suspect this was partially due to her pain medications. Some anxiety driven changes as well.   The remainder of her hospital stay consisted of increased po intake, improved mentation, continued progress with therapy teams, improved pain control, improved ambulation, and incision site care.  On POD# 8 she remained stable for discharge home. Her mentation was improved. Right leg incisions remained intact. Right leg well perfused. Blood sugars better maintained. Hemodynamically stable. Increased PO intake and ambulating well with Therapy teams. [facility] used for transportation home. She will discharge to stay with her daughter. Encouraged her to elevate her leg and/or wrap with ACE daily to help with edema. She will continue on Plavix  and Statin. She will continue her other medications as prescribed. PDMP reviewed and post operative pain medication sent to her pharmacy. Follow up arranged in 2-3 weeks for incision checks.   Consults: None  Treatments: antibiotics: Ancef  and Doxycycline , analgesia: Morphine  and Oxycodone -Acetaminophen , anticoagulation: heparin  and warfarin, therapies: PT, OT, RN, and SW, and surgery: 1.  Harvest of right leg great saphenous vein 2.  Right superficial femoral artery to anterior tibial artery bypass with ipsilateral nonreversed great saphenous vein   Disposition: Discharge disposition: 01-Home or Self Care       - For [facility] Registry use ---  Post-op:  Wound infection: No  Graft infection: No  Transfusion: Yes  If yes, 2 units given New Arrhythmia: No Patency judged by: [ X] Dopper only, GALERIUS.GANT ] Palpable graft pulse, [ ]  Palpable distal pulse, [ ]  ABI inc. > 0.15, [ ]  Duplex D/C Ambulatory  Status: Ambulatory with Assistance  Complications: MI: GALERIUS.GANT ] No, [ ]  Troponin only, [ ]  EKG or Clinical CHF: No Resp  failure: [ X] none, [ ]  Pneumonia, [ ]  Ventilator Chg in renal function: GALERIUS.GANT ] none, [ ]  Inc. Cr > 0.5, [ ]  Temp. Dialysis, [ ]  Permanent dialysis Stroke: [ X] None, [ ]  Minor, [ ]  Major Return to OR: No  Reason for return to OR: [ ]  Bleeding, [ ]  Infection, [ ]  Thrombosis, [ ]  Revision  Discharge medications: Statin use:  Yes ASA use:  No  for medical reason on Coumadin  Plavix  use:  No  for medical reason on Coumadin  Beta blocker use: Yes Coumadin  use: Yes    Patient Instructions:  Allergies as of 06/09/2023       Reactions   Tape Other (See Comments)   Skin comes off with any adhesive   Visipaque  [iodixanol ] Nausea And Vomiting   Patient had vagal response during procedure became hypertensive, and bradycardic. CO2 used during procedure prior to contrast being used, this may be cause as well. Recommended premedicating prior to contrast being used in the future.    Wellbutrin [bupropion] Hives   Gabapentin     dizzy   Ciprofloxacin  Hcl Hives, Rash   Hives/rash at injection site; 01/15/22 tolerated IV cipro         Medication List     PAUSE taking these medications    atorvastatin  40 MG tablet Wait to take this until your doctor or other care provider tells you to start again. Commonly known as: LIPITOR TAKE 1 TABLET BY MOUTH ONCE DAILY. You also have another medication with the same name that you may need to continue taking.       STOP taking these medications    cephALEXin  500 MG capsule Commonly known as: KEFLEX    doxycycline  100 MG tablet Commonly known as: VIBRA -TABS   oxyCODONE -acetaminophen  10-325 MG tablet Commonly known as: Percocet Replaced by: oxyCODONE -acetaminophen  5-325 MG tablet       TAKE these medications    acetaminophen  325 MG tablet Commonly known as: TYLENOL  Take 1-2 tablets (325-650 mg total) by mouth every 4 (four) hours as needed for mild pain. What changed:  how much to take when to take this   atorvastatin  40 MG tablet Commonly  known as: LIPITOR Take 1 tablet (40 mg total) by mouth daily. What changed: Another medication with the same name was paused. Ask your nurse or doctor if you should take this medication.   clonazePAM  1 MG disintegrating tablet Commonly known as: KLONOPIN  Take 1 tablet (1 mg total) by mouth 2 (two) times daily as needed for seizure (For seizure lasting more than 2 minutes.).   clopidogrel  75 MG tablet Commonly known as: PLAVIX  Take 1 tablet (75 mg total) by mouth daily.   Dexcom G6 Sensor Misc CHANGE SENSOR EVERY 10 DAYS.   Dexcom G6 Transmitter Misc Change transmitter every 90 days as directed.   furosemide  40 MG tablet Commonly known as: LASIX  TAKE 1 TABLET BY MOUTH ONCE DAILY AS NEEDED FOR SWELLING *PATIENT NEEDS APPOINTMENT* What changed:  how much to take how to take this when to take this reasons to take this   hydrALAZINE  25 MG tablet Commonly known as: APRESOLINE  Take 1 tablet (25 mg total) by mouth in the morning and at bedtime.   insulin  aspart 100 UNIT/ML injection Commonly known as: novoLOG  Use with Omnipod for daily dose around 100 units daily What changed:  how much to take how  to take this when to take this additional instructions   isosorbide  mononitrate 60 MG 24 hr tablet Commonly known as: IMDUR  TAKE TWO (2) TABLETS BY MOUTH EVERY DAY *NEW PRESCRIPTION REQUEST* What changed: See the new instructions.   levothyroxine  125 MCG tablet Commonly known as: SYNTHROID  Take 1 tablet (125 mcg total) by mouth daily at 6 (six) AM.   metoprolol  succinate 50 MG 24 hr tablet Commonly known as: TOPROL -XL Take 1 tablet (50 mg total) by mouth daily. Take with or immediately following a meal.   nitroGLYCERIN  0.4 MG SL tablet Commonly known as: NITROSTAT  Place 1 tablet (0.4 mg total) under the tongue every 5 (five) minutes as needed for chest pain.   Omnipod 5 DexG7G6 Pods Gen 5 Misc Change pod every 48-72 hours.   oxyCODONE -acetaminophen  5-325 MG  tablet Commonly known as: PERCOCET/ROXICET Take 1 tablet by mouth every 6 (six) hours as needed for moderate pain (pain score 4-6). Replaces: oxyCODONE -acetaminophen  10-325 MG tablet   pantoprazole  40 MG tablet Commonly known as: PROTONIX  Take 1 tablet (40 mg total) by mouth daily. Courtesy fill/ pt to get established with a primary MD for further refills What changed:  when to take this reasons to take this   ranolazine  1000 MG SR tablet Commonly known as: RANEXA  TAKE ONE (1) TABLET BY MOUTH TWICE DAILY *NEW PRESCRIPTION REQUEST*   senna-docusate 8.6-50 MG tablet Commonly known as: Senokot-S Take 1-2 tablets by mouth daily as needed for mild constipation.   sodium bicarbonate  650 MG tablet Take 1 tablet (650 mg total) by mouth 2 (two) times daily.   tiZANidine  4 MG tablet Commonly known as: ZANAFLEX  TAKE 2 TABLETS BY MOUTH ONCE DAILY AT BEDTIME What changed: See the new instructions.   topiramate  50 MG tablet Commonly known as: TOPAMAX  Take 1 tablet (50 mg total) by mouth 2 (two) times daily.   traZODone  100 MG tablet Commonly known as: DESYREL  TAKE 1 TABLET BY MOUTH AT BEDTIME *NEW PRESCRIPTION REQUEST* What changed: See the new instructions.   warfarin 5 MG tablet Commonly known as: COUMADIN  Take as directed. If you are unsure how to take this medication, talk to your nurse or doctor. Original instructions: TAKE ONE TABLET BY MOUTH DAILY EXCEPT 1/2 TABLET ON WEDNESDAYS AND SATURDAYS OR AS DIRECTED       Activity: activity as tolerated, no driving while on analgesics, and no heavy lifting for 6 weeks Diet: low fat, low cholesterol diet Wound Care:  Clean incisions daily with mild soap and water , pat dry. Do not soak in bathtub  Follow-up with [facility] in  2-3  weeks.  Signed: Teretha Damme 06/10/2023 1:03 PM

## 2023-06-10 NOTE — Transitions of Care (Post Inpatient/ED Visit) (Signed)
 06/10/2023  Name: Nehal Witting MRN: [id number] DOB: [birth year]  Today's TOC FU Call Status: Today's TOC FU Call Status:: Successful TOC FU Call Completed TOC FU Call Complete Date: 06/10/23 Patient's Name and Date of Birth confirmed.  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 How have you been since you were released from the hospital?: Better (Reports that she has missed time due to being in the hospital) Any questions or concerns?: Yes Patient Questions/Concerns:: Reports that she did not sleep well Patient Questions/Concerns Addressed: Other: (Reports she is taking all her medications as prescribed)  Items Reviewed: Did you receive and understand the discharge instructions provided?: Yes Medications obtained,verified, and reconciled?: Yes (Medications Reviewed) Any new allergies since your discharge?: No Dietary orders reviewed?: Yes Type of Diet Ordered:: carb modified Do you have support at home?: Yes People in Home: spouse Name of Support/Comfort Primary Source: husband  Medications Reviewed Today: Medications Reviewed Today     Reviewed by Rumalda Alan PENNER, RN (Registered Nurse) on 06/10/23 at 1106  Med List Status: <None>   Medication Order Taking? Sig Documenting Provider Last Dose Status Informant  acetaminophen  (TYLENOL ) 325 MG tablet [id number] Yes Take 1-2 tablets (325-650 mg total) by mouth every 4 (four) hours as needed for mild pain.  Patient taking differently: Take 650 mg by mouth as needed for mild pain (pain score 1-3).   Maurice Sharlet RAMAN, PA-C Taking Active Spouse/Significant Other, Pharmacy Records  atorvastatin  (LIPITOR) 40 MG tablet [id number] No TAKE 1 TABLET BY MOUTH ONCE DAILY.  Patient not taking: Reported on 06/10/2023   Melvenia Manus BRAVO, MD Not Taking Active  Spouse/Significant Other, Pharmacy Records           Med Note CARLEEN, [facility] D   Thu Jun 02, 2023  9:54 AM) On hold until seen by PCP  atorvastatin  (LIPITOR) 40 MG tablet [id number] No Take 1 tablet (40 mg total) by mouth daily.  Patient not taking: Reported on 06/10/2023   Charlyne Reed, PA-C Not Taking Active   clonazePAM  (KLONOPIN ) 1 MG disintegrating tablet [id number] No Take 1 tablet (1 mg total) by mouth 2 (two) times daily as needed for seizure (For seizure lasting more than 2 minutes.).  Patient not taking: Reported on 06/10/2023   Melvenia Manus BRAVO, MD Not Taking Active Spouse/Significant Other, Pharmacy Records           Med Note CARLEEN, [facility] D   Thu Jun 02, 2023  9:58 AM) UNKNOWN  clopidogrel  (PLAVIX ) 75 MG tablet [id number] Yes Take 1 tablet (75 mg total) by mouth daily. Melvenia Manus BRAVO, MD Taking Active Spouse/Significant Other, Pharmacy Records  Continuous Glucose Sensor (DEXCOM G6 SENSOR) [place] [id number] Yes CHANGE SENSOR EVERY 10 DAYS. Therisa Benton PARAS, NP Taking Active Spouse/Significant Other, Pharmacy Records  Continuous Glucose Transmitter (DEXCOM G6 TRANSMITTER) MISC [id number] Yes Change transmitter every 90 days as directed. Nida, Gebreselassie W, MD Taking Active Spouse/Significant Other, Pharmacy Records  furosemide  (LASIX ) 40 MG tablet [id number] Yes TAKE 1 TABLET BY MOUTH ONCE DAILY AS NEEDED FOR SWELLING *PATIENT NEEDS APPOINTMENT*  Patient taking differently: Take 40 mg by mouth daily as needed for edema. TAKE 1 TABLET BY MOUTH ONCE DAILY AS NEEDED FOR SWELLING *PATIENT NEEDS APPOINTMENT*   Branch, Dorn FALCON, MD Taking Active Spouse/Significant Other, Pharmacy Records  hydrALAZINE  (APRESOLINE ) 25 MG tablet [id number] Yes Take 1 tablet (25 mg  total) by mouth in the morning and at bedtime. Ricky Fines, MD Taking Active Spouse/Significant Other, Pharmacy Records  insulin  aspart (NOVOLOG ) 100 UNIT/ML injection [id number] Yes Use with Omnipod for daily dose around  100 units daily  Patient taking differently: Inject 0-200 Units into the skin See admin instructions. Load Omnipod with 200 units every 3-4 days.   Therisa Benton PARAS, NP Taking Active Spouse/Significant Other, Pharmacy Records           Med Note RUMALDO, DIAMOND BRAVO   Wed Jun 01, 2023  7:35 AM) Insulin  pump removed at 0300. Basal rate was .90   Insulin  Disposable Pump (OMNIPOD 5 DEXG7G6 PODS GEN 5) MISC [id number] Yes Change pod every 48-72 hours. Therisa Benton PARAS, NP Taking Active Spouse/Significant Other, Pharmacy Records  isosorbide  mononitrate (IMDUR ) 60 MG 24 hr tablet [id number] Yes TAKE TWO (2) TABLETS BY MOUTH EVERY DAY *NEW PRESCRIPTION REQUEST*  Patient taking differently: Take 60 mg by mouth daily.   Melvenia Manus BRAVO, MD Taking Active Spouse/Significant Other, Pharmacy Records  levothyroxine  (SYNTHROID ) 125 MCG tablet [id number] Yes Take 1 tablet (125 mcg total) by mouth daily at 6 (six) AM. Therisa Benton PARAS, NP Taking Active Spouse/Significant Other, Pharmacy Records  metoprolol  succinate (TOPROL -XL) 50 MG 24 hr tablet [id number] Yes Take 1 tablet (50 mg total) by mouth daily. Take with or immediately following a meal. Laurence Locus, DO Taking Active Spouse/Significant Other, Pharmacy Records  nitroGLYCERIN  (NITROSTAT ) 0.4 MG SL tablet [id number] No Place 1 tablet (0.4 mg total) under the tongue every 5 (five) minutes as needed for chest pain.  Patient not taking: Reported on 06/10/2023   Melvenia Manus BRAVO, MD Not Taking Active Spouse/Significant Other, Pharmacy Records           Med Note CARLEEN, [place] D   Thu Jun 02, 2023  9:56 AM)    oxyCODONE -acetaminophen  (PERCOCET/ROXICET) 5-325 MG tablet [id number] Yes Take 1 tablet by mouth every 6 (six) hours as needed for moderate pain (pain score 4-6). Charlyne Reed, PA-C Taking Active   pantoprazole  (PROTONIX ) 40 MG tablet [id number] Yes Take 1 tablet (40 mg total) by mouth daily. Courtesy fill/ pt to get established with a primary MD for  further refills Melvenia Manus BRAVO, MD Taking Active Spouse/Significant Other, Pharmacy Records  ranolazine  (RANEXA ) 1000 MG SR tablet [id number] No TAKE ONE (1) TABLET BY MOUTH TWICE DAILY *NEW PRESCRIPTION REQUEST*  Patient not taking: Reported on 06/10/2023   Melvenia Manus BRAVO, MD Not Taking Active Spouse/Significant Other, Pharmacy Records  senna-docusate (SENOKOT-S) 8.6-50 MG tablet [id number] No Take 1-2 tablets by mouth daily as needed for mild constipation.  Patient not taking: Reported on 06/10/2023   Melvenia Manus BRAVO, MD Not Taking Active Spouse/Significant Other, Pharmacy Records           Med Note [place], CHUCK KANDICE Debar May 17, 2023  8:14 PM) Unknown last dose  sodium bicarbonate  650 MG tablet [id number] Yes Take 1 tablet (650 mg total) by mouth 2 (two) times daily. Melvenia Manus BRAVO, MD Taking Active Spouse/Significant Other, Pharmacy Records  tiZANidine  (ZANAFLEX ) 4 MG tablet [id number] Yes TAKE 2 TABLETS BY MOUTH ONCE DAILY AT BEDTIME  Patient taking differently: Take 4 mg by mouth at bedtime.   Melvenia Manus BRAVO, MD Taking Active Spouse/Significant Other, Pharmacy Records  topiramate  (TOPAMAX ) 50 MG tablet [id number] Yes Take 1 tablet (50 mg total) by mouth 2 (two) times daily. Pearlean Manus, MD Taking Active Spouse/Significant Other, Pharmacy Records  traZODone  (DESYREL ) 100 MG tablet  [id number] Yes TAKE 1 TABLET BY MOUTH AT BEDTIME *NEW PRESCRIPTION REQUEST*  Patient taking differently: Take 100 mg by mouth at bedtime.   Melvenia Manus BRAVO, MD Taking Active Spouse/Significant Other, Pharmacy Records  warfarin (COUMADIN ) 5 MG tablet [id number] Yes TAKE ONE TABLET BY MOUTH DAILY EXCEPT 1/2 TABLET ON WEDNESDAYS AND SATURDAYS OR AS DIRECTED  Patient taking differently: Take 2.5-5 mg by mouth daily. Take 5mg  (1 tablet) daily, except on Tuesday, Thursday, and Saturday's take 2.5mg  (1/2 tablet).   Alvan Dorn FALCON, MD Taking Active Spouse/Significant Other, Pharmacy Records            Home  Care and Equipment/Supplies: Were Home Health Services Ordered?: Yes Name of Home Health Agency:: [facility] Has Agency set up a time to come to your home?: No (Patient was active with home health prior to admission. She will call them today) Any new equipment or medical supplies ordered?: NA  Functional Questionnaire: Do you need assistance with bathing/showering or dressing?: Yes (husband) Do you need assistance with meal preparation?: No Do you need assistance with eating?: No Do you have difficulty maintaining continence: No Do you need assistance with getting out of bed/getting out of a chair/moving?: Yes (Cant use her right arm and right leg.) Do you have difficulty managing or taking your medications?: No  Follow up appointments reviewed: PCP Follow-up appointment confirmed?: No MD Provider Line Number:[phone] Given: No Specialist Hospital Follow-up appointment confirmed?: No Reason Specialist Follow-Up Not Confirmed: Patient has Specialist Provider Number and will Call for Appointment Do you need transportation to your follow-up appointment?: No Do you understand care options if your condition(s) worsen?: Yes-patient verbalized understanding  SDOH Interventions Today    Flowsheet Row Most Recent Value  SDOH Interventions   Food Insecurity Interventions Intervention Not Indicated  Housing Interventions Intervention Not Indicated  Transportation Interventions Intervention Not Indicated  Utilities Interventions Intervention Not Indicated      Interventions Today    Flowsheet Row Most Recent Value  Chronic Disease   Chronic disease during today's visit Diabetes, Other  [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]  General Interventions   General Interventions Discussed/Reviewed General Interventions Discussed, General Interventions Reviewed, Labs, Doctor Visits  Doctor Visits Discussed/Reviewed Doctor Visits Discussed  Exercise Interventions   Exercise  Discussed/Reviewed Exercise Discussed  Education Interventions   Education Provided Provided Education  [Reviewed with patient when to call MD for severe leg pain, numbness, drainage or signs of infection.]  Provided Verbal Education On Blood Sugar Monitoring, Nutrition, When to see the doctor, Medication  Nutrition Interventions   Nutrition Discussed/Reviewed Nutrition Discussed  Pharmacy Interventions   Pharmacy Dicussed/Reviewed Medications and their functions  Safety Interventions   Safety Discussed/Reviewed Fall Risk      ' TOC Interventions Today    Flowsheet Row Most Recent Value  TOC Interventions   TOC Interventions Discussed/Reviewed TOC Interventions Discussed, TOC Interventions Reviewed, Post discharge activity limitations per provider, Post op wound/incision care, S/S of infection      Spoke with patient and husband via speaker phone per patient request. Patient reports that she is struggling to get in and out of bed.  Reviewed hospital bed.  Reviewed with patient that she was active with [facility] prior to admission. Now discharged with [facility] RN/ PT/ OT and SW.   Offered to assist patient with needs and she states to me that she is tired of doctors and nurses.  Husband manages all patients medications and states to me today that patient takes  medications as prescribed.   Reviewed with patient when to see emergency care with concerns about blood flow to leg. Encouraged patient to call MD or Vascular team with any concerns.  Reviewed and offered 30 day TOC program and patient declined. Encouraged patient to call PCP and get a follow up appointment.   Alan Ee, RN, BSN, CEN [employer]- Transition of Care Team.  [facility] [phone]

## 2023-06-13 ENCOUNTER — Ambulatory Visit: Payer: Self-pay | Admitting: Internal Medicine

## 2023-06-13 NOTE — Telephone Encounter (Signed)
 Information obtained from Isa Manuel, [facility].    Chief Complaint: more than half the day is feeling depressed/down, low interest for a 2 week period, 12 on depression scale Symptoms: sadness Frequency: months Pertinent Negatives: Patient denies SI/HI Disposition: [] ED /[] Urgent Care (no appt availability in office) / [] Appointment(In office/virtual)/ []  [place] [facility]/ [] Home Care/ [] Refused Recommended Disposition /[] [place] [facility]/ [x]  Follow-up with PCP  Additional Notes: HH RN calling for RN orders, requesting RN visit BID for 1 week, once a week for 3 weeks. Scored 12 on depression scale. 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  Denies HI/SI. Pt also would benefit from a hosp bed, per [facility] RN.   Copied from CRM [id number]. Topic: Clinical - Red Word Triage >> Jun 13, 2023  1:56 PM Elle L wrote: Isa Manuel with Home Health was calling for verbal orders. However, when asking if she had new concerns about the patient she states that the patient has been expressing that she is depressed. Reason for Disposition . Requesting to talk with a counselor (mental health worker, psychiatrist, etc.)  Answer Assessment - Initial Assessment Questions 1. CONCERN: "What happened that made you call today?"     RN was doing intake on pt today and found the pt to score 12 on the depression scale 2. DEPRESSION SYMPTOM SCREENING: "How are you feeling overall?" (e.g., decreased energy, increased sleeping or difficulty sleeping, difficulty concentrating, feelings of sadness, guilt, hopelessness, or worthlessness)     hopelessness 3. RISK OF HARM - SUICIDAL IDEATION:  "Do you ever have thoughts of hurting or killing yourself?"  (e.g., yes, no, no but preoccupation with thoughts about death)   - INTENT:  "Do you have thoughts of hurting or killing yourself right NOW?" (e.g., yes, no, N/A)   - PLAN: "Do you have a specific plan for how you would do this?" (e.g., gun, knife, overdose, no plan,  N/A)     No 4. RISK OF HARM - HOMICIDAL IDEATION:  "Do you ever have thoughts of hurting or killing someone else?"  (e.g., yes, no, no but preoccupation with thoughts about death)   - INTENT:  "Do you have thoughts of hurting or killing someone right NOW?" (e.g., yes, no, N/A)   - PLAN: "Do you have a specific plan for how you would do this?" (e.g., gun, knife, no plan, N/A)      denies 5. FUNCTIONAL IMPAIRMENT: "How have things been going for you overall? Have you had more difficulty than usual doing your normal daily activities?"  (e.g., better, same, worse; self-care, school, work, interactions)     worse 6. SUPPORT: "Who is with you now?" "Who do you live with?" "Do you have family or friends who you can talk to?"      Pt lives with ex husband, per [facility] RN there is a lot of "tension" in the room 7. THERAPIST: "Do you have a counselor or therapist? Name?"     denies 8. STRESSORS: "Has there been any new stress or recent changes in your life?"     Declining health, numerous recent hosp stays 9. ALCOHOL  USE OR SUBSTANCE USE (DRUG USE): "Do you drink alcohol  or use any illegal drugs?"     denies  Protocols used: Depression-A-AH

## 2023-06-14 ENCOUNTER — Encounter: Payer: Self-pay | Admitting: Cardiology

## 2023-06-14 NOTE — Telephone Encounter (Signed)
Scheduled TOC

## 2023-06-14 NOTE — Telephone Encounter (Signed)
Lvm for verbal orders

## 2023-06-16 ENCOUNTER — Telehealth (HOSPITAL_COMMUNITY): Payer: Self-pay

## 2023-06-16 ENCOUNTER — Telehealth: Payer: Self-pay

## 2023-06-16 NOTE — Telephone Encounter (Signed)
Left voice mail

## 2023-06-16 NOTE — Telephone Encounter (Signed)
Copied from CRM [id number]. Topic: Clinical - Home Health Verbal Orders >> Jun 15, 2023  4:31 PM Antony Haste wrote: Caller/Agency: Misty Stanley -OT- [facility] Callback Number: [phone]  Service Requested: Occupational Therapy Frequency: 1x a week for 8 weeks Any new concerns about the patient? No, Misty Stanley will be faxing over additional information regarding the verbal orders for this patient that must be signed by her PCP.

## 2023-06-16 NOTE — Telephone Encounter (Signed)
Verbal orders given to [facility]

## 2023-06-16 NOTE — Telephone Encounter (Signed)
Copied from CRM [id number]. Topic: Clinical - Home Health Verbal Orders >> Jun 16, 2023  1:38 PM Antony Haste wrote: [facility]  Callback Number: [phone] Service Requested: Physical Therapy Frequency:  1 week 1  2 week 2  1 week 1  For strengthening, safety, and fall prevention. Molly Maduro states her care has been resumed.   Any new concerns about the patient? Yes, Molly Maduro states she almost fell out of her bed this morning, her bed is higher up. He is wanting an order for a hospital bed to be sent in by Dr. Durwin Nora for the safety of her getting in/out of bed.

## 2023-06-16 NOTE — Telephone Encounter (Signed)
Called patient at [phone] to schedule a new patient appointment from referral received to the [facility] at [facility].   Front office staff was unable to reach patient on her telephone and was also unable to leave a voice message.  The voicemail box is full and front office was unable to leave a voice message for patient to give the [facility] a call back to schedule from the referral.

## 2023-06-17 ENCOUNTER — Inpatient Hospital Stay: Payer: 59 | Admitting: Family Medicine

## 2023-06-17 ENCOUNTER — Other Ambulatory Visit (HOSPITAL_COMMUNITY): Payer: Self-pay

## 2023-06-19 ENCOUNTER — Inpatient Hospital Stay (HOSPITAL_COMMUNITY): Payer: 59

## 2023-06-19 ENCOUNTER — Inpatient Hospital Stay (HOSPITAL_COMMUNITY)
Admission: EM | Admit: 2023-06-19 | Discharge: 2023-07-01 | DRG: 025 | Disposition: A | Discharge status: Hospice | Payer: 59 | Attending: Internal Medicine | Admitting: Internal Medicine

## 2023-06-19 ENCOUNTER — Emergency Department (HOSPITAL_COMMUNITY): Payer: 59

## 2023-06-19 ENCOUNTER — Encounter (HOSPITAL_COMMUNITY): Payer: Self-pay

## 2023-06-19 ENCOUNTER — Other Ambulatory Visit: Payer: Self-pay

## 2023-06-19 DIAGNOSIS — F17210 Nicotine dependence, cigarettes, uncomplicated: Secondary | ICD-10-CM | POA: Diagnosis present

## 2023-06-19 DIAGNOSIS — E1140 Type 2 diabetes mellitus with diabetic neuropathy, unspecified: Secondary | ICD-10-CM | POA: Diagnosis present

## 2023-06-19 DIAGNOSIS — N184 Chronic kidney disease, stage 4 (severe): Secondary | ICD-10-CM | POA: Diagnosis not present

## 2023-06-19 DIAGNOSIS — Z7189 Other specified counseling: Secondary | ICD-10-CM | POA: Diagnosis not present

## 2023-06-19 DIAGNOSIS — E039 Hypothyroidism, unspecified: Secondary | ICD-10-CM | POA: Diagnosis not present

## 2023-06-19 DIAGNOSIS — E875 Hyperkalemia: Secondary | ICD-10-CM | POA: Diagnosis present

## 2023-06-19 DIAGNOSIS — Z515 Encounter for palliative care: Secondary | ICD-10-CM | POA: Diagnosis not present

## 2023-06-19 DIAGNOSIS — Z803 Family history of malignant neoplasm of breast: Secondary | ICD-10-CM

## 2023-06-19 DIAGNOSIS — R638 Other symptoms and signs concerning food and fluid intake: Secondary | ICD-10-CM | POA: Diagnosis not present

## 2023-06-19 DIAGNOSIS — Z7901 Long term (current) use of anticoagulants: Secondary | ICD-10-CM

## 2023-06-19 DIAGNOSIS — S06A0XA Traumatic brain compression without herniation, initial encounter: Secondary | ICD-10-CM | POA: Diagnosis present

## 2023-06-19 DIAGNOSIS — G40909 Epilepsy, unspecified, not intractable, without status epilepticus: Secondary | ICD-10-CM | POA: Diagnosis present

## 2023-06-19 DIAGNOSIS — E43 Unspecified severe protein-calorie malnutrition: Secondary | ICD-10-CM | POA: Diagnosis not present

## 2023-06-19 DIAGNOSIS — Z781 Physical restraint status: Secondary | ICD-10-CM

## 2023-06-19 DIAGNOSIS — Z83438 Family history of other disorder of lipoprotein metabolism and other lipidemia: Secondary | ICD-10-CM

## 2023-06-19 DIAGNOSIS — Z881 Allergy status to other antibiotic agents status: Secondary | ICD-10-CM

## 2023-06-19 DIAGNOSIS — E109 Type 1 diabetes mellitus without complications: Secondary | ICD-10-CM | POA: Diagnosis not present

## 2023-06-19 DIAGNOSIS — I69354 Hemiplegia and hemiparesis following cerebral infarction affecting left non-dominant side: Secondary | ICD-10-CM | POA: Diagnosis not present

## 2023-06-19 DIAGNOSIS — S065XAA Traumatic subdural hemorrhage with loss of consciousness status unknown, initial encounter: Secondary | ICD-10-CM | POA: Diagnosis not present

## 2023-06-19 DIAGNOSIS — E1122 Type 2 diabetes mellitus with diabetic chronic kidney disease: Secondary | ICD-10-CM | POA: Diagnosis present

## 2023-06-19 DIAGNOSIS — Z91048 Other nonmedicinal substance allergy status: Secondary | ICD-10-CM

## 2023-06-19 DIAGNOSIS — E11649 Type 2 diabetes mellitus with hypoglycemia without coma: Secondary | ICD-10-CM | POA: Diagnosis not present

## 2023-06-19 DIAGNOSIS — K5900 Constipation, unspecified: Secondary | ICD-10-CM | POA: Diagnosis present

## 2023-06-19 DIAGNOSIS — R627 Adult failure to thrive: Secondary | ICD-10-CM | POA: Diagnosis not present

## 2023-06-19 DIAGNOSIS — E1151 Type 2 diabetes mellitus with diabetic peripheral angiopathy without gangrene: Secondary | ICD-10-CM | POA: Diagnosis present

## 2023-06-19 DIAGNOSIS — I6930 Unspecified sequelae of cerebral infarction: Secondary | ICD-10-CM | POA: Diagnosis not present

## 2023-06-19 DIAGNOSIS — E860 Dehydration: Secondary | ICD-10-CM | POA: Diagnosis present

## 2023-06-19 DIAGNOSIS — S065X0A Traumatic subdural hemorrhage without loss of consciousness, initial encounter: Secondary | ICD-10-CM | POA: Diagnosis present

## 2023-06-19 DIAGNOSIS — Z66 Do not resuscitate: Secondary | ICD-10-CM | POA: Diagnosis present

## 2023-06-19 DIAGNOSIS — E8809 Other disorders of plasma-protein metabolism, not elsewhere classified: Secondary | ICD-10-CM | POA: Diagnosis present

## 2023-06-19 DIAGNOSIS — W1830XA Fall on same level, unspecified, initial encounter: Secondary | ICD-10-CM | POA: Diagnosis present

## 2023-06-19 DIAGNOSIS — I5022 Chronic systolic (congestive) heart failure: Secondary | ICD-10-CM | POA: Diagnosis present

## 2023-06-19 DIAGNOSIS — I252 Old myocardial infarction: Secondary | ICD-10-CM

## 2023-06-19 DIAGNOSIS — Z8673 Personal history of transient ischemic attack (TIA), and cerebral infarction without residual deficits: Secondary | ICD-10-CM | POA: Diagnosis not present

## 2023-06-19 DIAGNOSIS — Z823 Family history of stroke: Secondary | ICD-10-CM

## 2023-06-19 DIAGNOSIS — E1165 Type 2 diabetes mellitus with hyperglycemia: Secondary | ICD-10-CM | POA: Diagnosis not present

## 2023-06-19 DIAGNOSIS — N179 Acute kidney failure, unspecified: Secondary | ICD-10-CM | POA: Diagnosis not present

## 2023-06-19 DIAGNOSIS — Z79890 Hormone replacement therapy (postmenopausal): Secondary | ICD-10-CM

## 2023-06-19 DIAGNOSIS — Z8249 Family history of ischemic heart disease and other diseases of the circulatory system: Secondary | ICD-10-CM

## 2023-06-19 DIAGNOSIS — D500 Iron deficiency anemia secondary to blood loss (chronic): Secondary | ICD-10-CM | POA: Diagnosis present

## 2023-06-19 DIAGNOSIS — E782 Mixed hyperlipidemia: Secondary | ICD-10-CM | POA: Diagnosis present

## 2023-06-19 DIAGNOSIS — Z79899 Other long term (current) drug therapy: Secondary | ICD-10-CM

## 2023-06-19 DIAGNOSIS — I130 Hypertensive heart and chronic kidney disease with heart failure and stage 1 through stage 4 chronic kidney disease, or unspecified chronic kidney disease: Secondary | ICD-10-CM | POA: Diagnosis not present

## 2023-06-19 DIAGNOSIS — I2510 Atherosclerotic heart disease of native coronary artery without angina pectoris: Secondary | ICD-10-CM | POA: Diagnosis present

## 2023-06-19 DIAGNOSIS — I959 Hypotension, unspecified: Secondary | ICD-10-CM | POA: Diagnosis present

## 2023-06-19 DIAGNOSIS — N183 Chronic kidney disease, stage 3 unspecified: Secondary | ICD-10-CM | POA: Diagnosis not present

## 2023-06-19 DIAGNOSIS — I5020 Unspecified systolic (congestive) heart failure: Secondary | ICD-10-CM

## 2023-06-19 DIAGNOSIS — E871 Hypo-osmolality and hyponatremia: Secondary | ICD-10-CM | POA: Diagnosis present

## 2023-06-19 DIAGNOSIS — E669 Obesity, unspecified: Secondary | ICD-10-CM | POA: Diagnosis present

## 2023-06-19 DIAGNOSIS — Z7902 Long term (current) use of antithrombotics/antiplatelets: Secondary | ICD-10-CM

## 2023-06-19 DIAGNOSIS — I6201 Nontraumatic acute subdural hemorrhage: Secondary | ICD-10-CM | POA: Diagnosis not present

## 2023-06-19 DIAGNOSIS — Y92008 Other place in unspecified non-institutional (private) residence as the place of occurrence of the external cause: Secondary | ICD-10-CM | POA: Diagnosis not present

## 2023-06-19 DIAGNOSIS — Z888 Allergy status to other drugs, medicaments and biological substances status: Secondary | ICD-10-CM

## 2023-06-19 DIAGNOSIS — Z91041 Radiographic dye allergy status: Secondary | ICD-10-CM

## 2023-06-19 DIAGNOSIS — Z794 Long term (current) use of insulin: Secondary | ICD-10-CM | POA: Diagnosis not present

## 2023-06-19 DIAGNOSIS — R791 Abnormal coagulation profile: Secondary | ICD-10-CM | POA: Diagnosis present

## 2023-06-19 DIAGNOSIS — Z8349 Family history of other endocrine, nutritional and metabolic diseases: Secondary | ICD-10-CM

## 2023-06-19 DIAGNOSIS — I5042 Chronic combined systolic (congestive) and diastolic (congestive) heart failure: Secondary | ICD-10-CM | POA: Diagnosis not present

## 2023-06-19 DIAGNOSIS — I739 Peripheral vascular disease, unspecified: Secondary | ICD-10-CM | POA: Diagnosis not present

## 2023-06-19 DIAGNOSIS — R519 Headache, unspecified: Secondary | ICD-10-CM | POA: Diagnosis present

## 2023-06-19 LAB — URINALYSIS, ROUTINE W REFLEX MICROSCOPIC
Bacteria, UA: NONE SEEN
Bilirubin Urine: NEGATIVE
Glucose, UA: >=500 mg/dL — AB
Hgb urine dipstick: NEGATIVE
Ketones, ur: 5 mg/dL — AB
Leukocytes,Ua: NEGATIVE
Nitrite: NEGATIVE
Protein, ur: 100 mg/dL — AB
Specific Gravity, Urine: 1.008 (ref 1.005–1.030)
pH: 6 (ref 5.0–8.0)

## 2023-06-19 LAB — HCG, SERUM, QUALITATIVE: Preg, Serum: NEGATIVE

## 2023-06-19 LAB — CBC WITH DIFFERENTIAL/PLATELET
Abs Immature Granulocytes: 0.1 10*3/uL — ABNORMAL HIGH (ref 0.00–0.07)
Basophils Absolute: 0 10*3/uL (ref 0.0–0.1)
Basophils Relative: 0 %
Eosinophils Absolute: 0.1 10*3/uL (ref 0.0–0.5)
Eosinophils Relative: 0 %
HCT: 36.9 % (ref 36.0–46.0)
Hemoglobin: 11.7 g/dL — ABNORMAL LOW (ref 12.0–15.0)
Immature Granulocytes: 1 %
Lymphocytes Relative: 6 %
Lymphs Abs: 1.1 10*3/uL (ref 0.7–4.0)
MCH: 28.5 pg (ref 26.0–34.0)
MCHC: 31.7 g/dL (ref 30.0–36.0)
MCV: 89.8 fL (ref 80.0–100.0)
Monocytes Absolute: 0.7 10*3/uL (ref 0.1–1.0)
Monocytes Relative: 4 %
Neutro Abs: 16.9 10*3/uL — ABNORMAL HIGH (ref 1.7–7.7)
Neutrophils Relative %: 89 %
Platelets: 389 10*3/uL (ref 150–400)
RBC: 4.11 MIL/uL (ref 3.87–5.11)
RDW: 18.5 % — ABNORMAL HIGH (ref 11.5–15.5)
WBC: 19 10*3/uL — ABNORMAL HIGH (ref 4.0–10.5)
nRBC: 0 % (ref 0.0–0.2)

## 2023-06-19 LAB — COMPREHENSIVE METABOLIC PANEL
ALT: 11 U/L (ref 0–44)
AST: 17 U/L (ref 15–41)
Albumin: 2.7 g/dL — ABNORMAL LOW (ref 3.5–5.0)
Alkaline Phosphatase: 139 U/L — ABNORMAL HIGH (ref 38–126)
Anion gap: 12 (ref 5–15)
BUN: 18 mg/dL (ref 6–20)
CO2: 21 mmol/L — ABNORMAL LOW (ref 22–32)
Calcium: 8.1 mg/dL — ABNORMAL LOW (ref 8.9–10.3)
Chloride: 101 mmol/L (ref 98–111)
Creatinine, Ser: 2.37 mg/dL — ABNORMAL HIGH (ref 0.44–1.00)
GFR, Estimated: 26 mL/min — ABNORMAL LOW (ref >60)
Glucose, Bld: 237 mg/dL — ABNORMAL HIGH (ref 70–99)
Potassium: 3.7 mmol/L (ref 3.5–5.1)
Sodium: 134 mmol/L — ABNORMAL LOW (ref 135–145)
Total Bilirubin: 1 mg/dL (ref 0.0–1.2)
Total Protein: 6.4 g/dL — ABNORMAL LOW (ref 6.5–8.1)

## 2023-06-19 LAB — PROTIME-INR
INR: 7.8 (ref 0.8–1.2)
INR: 1.5 — ABNORMAL HIGH (ref 0.8–1.2)
Prothrombin Time: 66 s — ABNORMAL HIGH (ref 11.4–15.2)
Prothrombin Time: 18.4 s — ABNORMAL HIGH (ref 11.4–15.2)

## 2023-06-19 LAB — MRSA NEXT GEN BY PCR, NASAL: MRSA by PCR Next Gen: NOT DETECTED

## 2023-06-19 LAB — GLUCOSE, CAPILLARY
Glucose-Capillary: 144 mg/dL — ABNORMAL HIGH (ref 70–99)
Glucose-Capillary: 378 mg/dL — ABNORMAL HIGH (ref 70–99)
Glucose-Capillary: 437 mg/dL — ABNORMAL HIGH (ref 70–99)

## 2023-06-19 LAB — CBG MONITORING, ED: Glucose-Capillary: 212 mg/dL — ABNORMAL HIGH (ref 70–99)

## 2023-06-19 MED ORDER — DOCUSATE SODIUM 100 MG PO CAPS
100.0000 mg | ORAL_CAPSULE | Freq: Two times a day (BID) | ORAL | Status: DC | PRN
  Administered 2023-06-26: 100 mg via ORAL

## 2023-06-19 MED ORDER — POLYETHYLENE GLYCOL 3350 17 G PO PACK: 17.0000 g | PACK | Freq: Every day | ORAL | Status: DC | PRN

## 2023-06-19 MED ORDER — ORAL CARE MOUTH RINSE: 15.0000 mL | OROMUCOSAL | Status: DC | PRN

## 2023-06-19 MED ORDER — LEVETIRACETAM IN NACL 500 MG/100ML IV SOLN
500.0000 mg | Freq: Two times a day (BID) | INTRAVENOUS | Status: DC
Start: 2023-06-19 — End: 2023-06-21
  Administered 2023-06-19 – 2023-06-21 (×4): 500 mg via INTRAVENOUS
  Filled 2023-06-19 (×4): qty 100

## 2023-06-19 MED ORDER — ORAL CARE MOUTH RINSE
15.0000 mL | OROMUCOSAL | Status: DC
  Administered 2023-06-19 – 2023-07-01 (×34): 15 mL via OROMUCOSAL

## 2023-06-19 MED ORDER — VITAMIN K1 10 MG/ML IJ SOLN
10.0000 mg | INTRAVENOUS | Status: AC
  Administered 2023-06-19: 10 mg via INTRAVENOUS
  Filled 2023-06-19: qty 1

## 2023-06-19 MED ORDER — ONDANSETRON HCL 4 MG/2ML IJ SOLN
4.0000 mg | Freq: Three times a day (TID) | INTRAMUSCULAR | Status: AC
  Administered 2023-06-19 (×2): 4 mg via INTRAVENOUS
  Filled 2023-06-19 (×2): qty 2

## 2023-06-19 MED ORDER — PROTHROMBIN COMPLEX CONC HUMAN 500 UNITS IV KIT
1649.0000 [IU] | PACK | Status: AC
  Administered 2023-06-19: 1649 [IU] via INTRAVENOUS
  Filled 2023-06-19: qty 649

## 2023-06-19 MED ORDER — HYDROMORPHONE HCL 1 MG/ML IJ SOLN
0.5000 mg | Freq: Once | INTRAMUSCULAR | Status: AC
  Administered 2023-06-19: 0.5 mg via INTRAVENOUS
  Filled 2023-06-19: qty 0.5

## 2023-06-19 MED ORDER — SODIUM CHLORIDE 0.9 % IV BOLUS
500.0000 mL | Freq: Once | INTRAVENOUS | Status: AC
  Administered 2023-06-19: 500 mL via INTRAVENOUS

## 2023-06-19 MED ORDER — CHLORHEXIDINE GLUCONATE CLOTH 2 % EX PADS
6.0000 | MEDICATED_PAD | Freq: Every day | CUTANEOUS | Status: DC
Start: 2023-06-19 — End: 2023-07-01
  Administered 2023-06-19 – 2023-06-29 (×8): 6 via TOPICAL

## 2023-06-19 MED ORDER — FENTANYL CITRATE PF 50 MCG/ML IJ SOSY
12.5000 ug | PREFILLED_SYRINGE | INTRAMUSCULAR | Status: DC | PRN
  Administered 2023-06-19 – 2023-06-29 (×11): 12.5 ug via INTRAVENOUS
  Filled 2023-06-19 (×11): qty 1

## 2023-06-19 MED ORDER — INSULIN ASPART 100 UNIT/ML IJ SOLN
0.0000 [IU] | INTRAMUSCULAR | Status: DC
  Administered 2023-06-19 (×2): 15 [IU] via SUBCUTANEOUS
  Administered 2023-06-19: 2 [IU] via SUBCUTANEOUS
  Administered 2023-06-20: 3 [IU] via SUBCUTANEOUS
  Administered 2023-06-20: 5 [IU] via SUBCUTANEOUS

## 2023-06-19 MED ORDER — ACETAMINOPHEN 650 MG RE SUPP: 325.0000 mg | Freq: Four times a day (QID) | RECTAL | Status: DC | PRN

## 2023-06-19 MED ORDER — FENTANYL CITRATE PF 50 MCG/ML IJ SOSY: 25.0000 ug | PREFILLED_SYRINGE | INTRAMUSCULAR | Status: DC | PRN

## 2023-06-19 MED ORDER — METOCLOPRAMIDE HCL 5 MG/ML IJ SOLN
10.0000 mg | Freq: Once | INTRAMUSCULAR | Status: AC
  Administered 2023-06-19: 10 mg via INTRAVENOUS
  Filled 2023-06-19: qty 2

## 2023-06-19 NOTE — Progress Notes (Addendum)
Patient is now consistently talking in incomprehensible speech and repeating phrases. She does appear to answer simple yes, no questions appropriately then goes back to repeating incomprehensible phrases.During the time of her admission at 1300 patient was lethargic but able to talk in full sentences, telling me how she fell and the events leading up to her admission. Since CT was stable and patient appears to be in pain, PRN fentanyl was given. Patient did admit to having a 10/10 headache when she was more coherent. Daughter, Al [facility]) was called and given an update. Neurosurgery also paged and made aware.

## 2023-06-19 NOTE — ED Notes (Signed)
[facility] is on the way for pt at this time

## 2023-06-19 NOTE — ED Provider Notes (Signed)
[place] EMERGENCY DEPARTMENT AT [facility] Provider Note   CSN: [id number] Arrival date & time: 06/19/23  4332     History {Add pertinent medical, surgical, social history, OB history to HPI:1} Chief Complaint  Patient presents with   Lauren [name] is a 43 y.o. female.  Patient has a history of coronary artery disease, stroke, diabetes and stage IV kidney disease.  Patient is presently taking Coumadin.  Patient had a fall last night and complains of pain in her head and her lateral chest  The history is provided by the patient and medical records. No language interpreter was used.  Fall This is a new problem. The current episode started 6 to 12 hours ago. The problem occurs rarely. The problem has been resolved. Associated symptoms include chest pain. Pertinent negatives include no abdominal pain and no headaches. Nothing aggravates the symptoms. Nothing relieves the symptoms. She has tried nothing for the symptoms.       Home Medications Prior to Admission medications   Medication Sig Start Date End Date Taking? Authorizing Provider  acetaminophen (TYLENOL) 325 MG tablet Take 1-2 tablets (325-650 mg total) by mouth every 4 (four) hours as needed for mild pain. Patient taking differently: Take 650 mg by mouth as needed for mild pain (pain score 1-3). 02/18/22   Love, Evlyn Kanner, PA-C  atorvastatin (LIPITOR) 40 MG tablet TAKE 1 TABLET BY MOUTH ONCE DAILY. Patient not taking: Reported on 06/10/2023 01/25/23   Billie Lade, MD  atorvastatin (LIPITOR) 40 MG tablet Take 1 tablet (40 mg total) by mouth daily. Patient not taking: Reported on 06/10/2023 06/07/23   Baglia, Corrina, PA-C  clonazePAM (KLONOPIN) 1 MG disintegrating tablet Take 1 tablet (1 mg total) by mouth 2 (two) times daily as needed for seizure (For seizure lasting more than 2 minutes.). Patient not taking: Reported on 06/10/2023 05/16/23   Billie Lade, MD  clopidogrel (PLAVIX) 75 MG tablet Take 1  tablet (75 mg total) by mouth daily. 05/16/23   Billie Lade, MD  Continuous Glucose Sensor (DEXCOM G6 SENSOR) MISC CHANGE SENSOR EVERY 10 DAYS. 05/16/23   Dani Gobble, NP  Continuous Glucose Transmitter (DEXCOM G6 TRANSMITTER) MISC Change transmitter every 90 days as directed. 01/20/23   Roma Kayser, MD  furosemide (LASIX) 40 MG tablet TAKE 1 TABLET BY MOUTH ONCE DAILY AS NEEDED FOR SWELLING *PATIENT NEEDS APPOINTMENT* Patient taking differently: Take 40 mg by mouth daily as needed for edema. TAKE 1 TABLET BY MOUTH ONCE DAILY AS NEEDED FOR SWELLING *PATIENT NEEDS APPOINTMENT* 05/13/23   Antoine Poche, MD  hydrALAZINE (APRESOLINE) 25 MG tablet Take 1 tablet (25 mg total) by mouth in the morning and at bedtime. 04/24/23   Vassie Loll, MD  insulin aspart (NOVOLOG) 100 UNIT/ML injection Use with Omnipod for daily dose around 100 units daily Patient taking differently: Inject 0-200 Units into the skin See admin instructions. Load Omnipod with 200 units every 3-4 days. 10/18/22   Dani Gobble, NP  Insulin Disposable Pump (OMNIPOD 5 DEXG7G6 PODS GEN 5) MISC Change pod every 48-72 hours. 11/11/22   Dani Gobble, NP  isosorbide mononitrate (IMDUR) 60 MG 24 hr tablet TAKE TWO (2) TABLETS BY MOUTH EVERY DAY *NEW PRESCRIPTION REQUEST* Patient taking differently: Take 60 mg by mouth daily. 05/12/23   Billie Lade, MD  levothyroxine (SYNTHROID) 125 MCG tablet Take 1 tablet (125 mcg total) by mouth daily at 6 (six) AM. 06/17/22 06/12/23  Dani Gobble, NP  metoprolol succinate (TOPROL-XL) 50 MG 24 hr tablet Take 1 tablet (50 mg total) by mouth daily. Take with or immediately following a meal. 05/03/23 08/01/23  Carollee Herter, DO  nitroGLYCERIN (NITROSTAT) 0.4 MG SL tablet Place 1 tablet (0.4 mg total) under the tongue every 5 (five) minutes as needed for chest pain. Patient not taking: Reported on 06/10/2023 05/16/23   Billie Lade, MD  oxyCODONE-acetaminophen (PERCOCET/ROXICET)  5-325 MG tablet Take 1 tablet by mouth every 6 (six) hours as needed for moderate pain (pain score 4-6). 06/07/23   Baglia, Corrina, PA-C  pantoprazole (PROTONIX) 40 MG tablet Take 1 tablet (40 mg total) by mouth daily. Courtesy fill/ pt to get established with a primary MD for further refills 05/16/23 11/12/23  Billie Lade, MD  ranolazine (RANEXA) 1000 MG SR tablet TAKE ONE (1) TABLET BY MOUTH TWICE DAILY *NEW PRESCRIPTION REQUEST* Patient not taking: Reported on 06/10/2023 05/12/23   Billie Lade, MD  senna-docusate (SENOKOT-S) 8.6-50 MG tablet Take 1-2 tablets by mouth daily as needed for mild constipation. Patient not taking: Reported on 06/10/2023 05/16/23   Billie Lade, MD  tiZANidine (ZANAFLEX) 4 MG tablet TAKE 2 TABLETS BY MOUTH ONCE DAILY AT BEDTIME Patient taking differently: Take 4 mg by mouth at bedtime. 05/16/23   Billie Lade, MD  topiramate (TOPAMAX) 50 MG tablet Take 1 tablet (50 mg total) by mouth 2 (two) times daily. 03/16/23   Shon Hale, MD  traZODone (DESYREL) 100 MG tablet TAKE 1 TABLET BY MOUTH AT BEDTIME *NEW PRESCRIPTION REQUEST* Patient taking differently: Take 100 mg by mouth at bedtime. 05/12/23   Billie Lade, MD  warfarin (COUMADIN) 5 MG tablet TAKE ONE TABLET BY MOUTH DAILY EXCEPT 1/2 TABLET ON WEDNESDAYS AND SATURDAYS OR AS DIRECTED Patient taking differently: Take 2.5-5 mg by mouth daily. Take 5mg  (1 tablet) daily, except on Tuesday, Thursday, and Saturday's take 2.5mg  (1/2 tablet). 02/14/23   Antoine Poche, MD      Allergies    Tape, Visipaque [iodixanol], Wellbutrin [bupropion], Gabapentin, and Ciprofloxacin hcl    Review of Systems   Review of Systems  Constitutional:  Negative for appetite change and fatigue.  HENT:  Negative for congestion, ear discharge and sinus pressure.        Headache  Eyes:  Negative for discharge.  Respiratory:  Negative for cough.   Cardiovascular:  Positive for chest pain.  Gastrointestinal:  Negative for  abdominal pain and diarrhea.  Genitourinary:  Negative for frequency and hematuria.  Musculoskeletal:  Negative for back pain.  Skin:  Negative for rash.  Neurological:  Negative for seizures and headaches.  Psychiatric/Behavioral:  Negative for hallucinations.     Physical Exam Updated Vital Signs BP 99/61   Pulse 87   Temp 97.9 F (36.6 C) (Oral)   Resp 17   Ht 5\' 5"  (1.651 m)   Wt 99.8 kg   LMP 05/25/2023 (Exact Date)   SpO2 98%   BMI 36.61 kg/m  Physical Exam Vitals and nursing note reviewed.  Constitutional:      Appearance: She is well-developed.  HENT:     Head: Normocephalic.     Nose: Nose normal.  Eyes:     General: No scleral icterus.    Conjunctiva/sclera: Conjunctivae normal.  Neck:     Thyroid: No thyromegaly.  Cardiovascular:     Rate and Rhythm: Normal rate and regular rhythm.     Heart sounds: No murmur heard.  No friction rub. No gallop.     Comments: Tender right lateral chest Pulmonary:     Breath sounds: No stridor. No wheezing or rales.  Chest:     Chest wall: No tenderness.  Abdominal:     General: There is no distension.     Tenderness: There is no abdominal tenderness. There is no rebound.  Musculoskeletal:        General: Normal range of motion.     Cervical back: Neck supple.  Lymphadenopathy:     Cervical: No cervical adenopathy.  Skin:    Findings: No erythema or rash.  Neurological:     Mental Status: She is alert and oriented to person, place, and time.     Motor: No abnormal muscle tone.     Coordination: Coordination normal.  Psychiatric:        Behavior: Behavior normal.     ED Results / Procedures / Treatments   Labs (all labs ordered are listed, but only abnormal results are displayed) Labs Reviewed  CBC WITH DIFFERENTIAL/PLATELET - Abnormal; Notable for the following components:      Result Value   WBC 19.0 (*)    Hemoglobin 11.7 (*)    RDW 18.5 (*)    Neutro Abs 16.9 (*)    Abs Immature Granulocytes 0.10  (*)    All other components within normal limits  COMPREHENSIVE METABOLIC PANEL - Abnormal; Notable for the following components:   Sodium 134 (*)    CO2 21 (*)    Glucose, Bld 237 (*)    Creatinine, Ser 2.37 (*)    Calcium 8.1 (*)    Total Protein 6.4 (*)    Albumin 2.7 (*)    Alkaline Phosphatase 139 (*)    GFR, Estimated 26 (*)    All other components within normal limits  PROTIME-INR - Abnormal; Notable for the following components:   Prothrombin Time 66.0 (*)    INR 7.8 (*)    All other components within normal limits  CBG MONITORING, ED - Abnormal; Notable for the following components:   Glucose-Capillary 212 (*)    All other components within normal limits  HCG, SERUM, QUALITATIVE  URINALYSIS, ROUTINE W REFLEX MICROSCOPIC  PROTIME-INR    EKG None  Radiology CT CHEST ABDOMEN PELVIS WO CONTRAST Result Date: 06/19/2023 CLINICAL DATA:  Status post fall. EXAM: CT CHEST, ABDOMEN AND PELVIS WITHOUT CONTRAST TECHNIQUE: Multidetector CT imaging of the chest, abdomen and pelvis was performed following the standard protocol without IV contrast. RADIATION DOSE REDUCTION: This exam was performed according to the departmental dose-optimization program which includes automated exposure control, adjustment of the mA and/or kV according to patient size and/or use of iterative reconstruction technique. COMPARISON:  CT chest 08/12/2022 and CT AP 08/03/2022 FINDINGS: CT CHEST FINDINGS Cardiovascular: Cardiac enlargement. No significant pericardial effusion. Aortic atherosclerosis and coronary artery calcification. Mediastinum/Nodes: No enlarged mediastinal, hilar, or axillary lymph nodes. Thyroid gland, trachea, and esophagus demonstrate no significant findings. Lungs/Pleura: No pleural effusion, airspace consolidation, atelectasis or pneumothorax. Scattered lung nodules are again noted including: -4 mm nodule in the superior segment of right lower lobe, image 47/6. Unchanged. -Subpleural nodule  overlying the lateral left lower lobe measures 4 mm and is unchanged in the interval, image 83/6. -Posterior right lower lobe lung nodule measures 0.9 cm, image 63/6. Previously 1 cm. Additional small scattered lung nodules are also stable in the interval. Musculoskeletal: Again seen is a comminuted, impacted fracture deformity involving the proximal left humerus. The fracture lines  appear distinct however there is signs of increased surrounding callus formation suggesting at least a subacute fracture. No new fractures identified. CT ABDOMEN PELVIS FINDINGS Hepatobiliary: No focal liver abnormality is seen. Status post cholecystectomy. No biliary dilatation. Pancreas: Unremarkable. No pancreatic ductal dilatation or surrounding inflammatory changes. Spleen: Normal in size without focal abnormality. Adrenals/Urinary Tract: Normal adrenal glands. No nephrolithiasis, hydronephrosis or mass. Bilateral renal vascular type calcifications noted. Bladder appears normal. Stomach/Bowel: Stomach is within normal limits. The appendix is visualized and appears normal. No bowel wall thickening, inflammation, or distension. Vascular/Lymphatic: Aortic atherosclerosis. No abdominopelvic adenopathy. Reproductive: Uterus and bilateral adnexa are unremarkable. Other: No free fluid or fluid collections. No signs of pneumoperitoneum. Musculoskeletal: No acute or significant osseous findings. Multiple injection site granulomas identified within the lower ventral abdominal wall. Within the proximal right medial thigh there is an focal fluid collection measuring approximately 2.6 x 1.7 cm, image 139/3. There is associated overlying soft tissue stranding. IMPRESSION: 1. No acute findings within the chest, abdomen or pelvis. 2. Again seen is a comminuted, impacted fracture deformity involving the proximal left humerus. The fracture lines appear distinct however there is signs of increased surrounding callus formation suggesting at least a  subacute fracture. 3. Within the proximal right medial thigh there is an focal fluid collection measuring approximately 2.6 x 1.7 cm. There is associated overlying soft tissue stranding. Findings are favored to represent a posttraumatic seroma or hematoma. 4. Stable appearance of scattered lung nodules. 5. Coronary artery calcifications. 6.  Aortic Atherosclerosis (ICD10-I70.0). Electronically Signed   By: Signa Kell M.D.   On: 06/19/2023 10:18   CT Cervical Spine Wo Contrast Result Date: 06/19/2023 CLINICAL DATA:  Fall striking head on floor, right subdural hematoma EXAM: CT CERVICAL SPINE WITHOUT CONTRAST TECHNIQUE: Multidetector CT imaging of the cervical spine was performed without intravenous contrast. Multiplanar CT image reconstructions were also generated. RADIATION DOSE REDUCTION: This exam was performed according to the departmental dose-optimization program which includes automated exposure control, adjustment of the mA and/or kV according to patient size and/or use of iterative reconstruction technique. COMPARISON:  03/13/2023 FINDINGS: Alignment: No vertebral subluxation is observed. Skull base and vertebrae: No fracture or acute bony findings. Soft tissues and spinal canal: Right ICA stent. Acute subdural hematoma noted along the right middle cranial fossa. This will be explored in further detail in the head CT report. Disc levels: Small central disc protrusion with associated spurring at C4-5 causing borderline central narrowing of the thecal sac. Otherwise no impingement. Upper chest: Unremarkable where included. Other: No supplemental non-categorized findings. IMPRESSION: 1. No acute cervical spine findings. 2. Small central disc protrusion with associated spurring at C4-5 causing borderline central narrowing of the thecal sac. 3. Acute subdural hematoma along the right middle cranial fossa. This will be exploration in further detail in the head CT report. Electronically Signed   By: Gaylyn Rong M.D.   On: 06/19/2023 10:12   CT Head Wo Contrast Result Date: 06/19/2023 CLINICAL DATA:  Head trauma with abnormal mental status EXAM: CT HEAD WITHOUT CONTRAST TECHNIQUE: Contiguous axial images were obtained from the base of the skull through the vertex without intravenous contrast. RADIATION DOSE REDUCTION: This exam was performed according to the departmental dose-optimization program which includes automated exposure control, adjustment of the mA and/or kV according to patient size and/or use of iterative reconstruction technique. COMPARISON:  05/27/2023 FINDINGS: Brain: Acute subdural hematoma along the right cerebral convexity measuring up to 2.2 cm in the parietal region with mild tentorial  and inter hemispheric component. Very heterogeneous hematoma with low-density areas suggesting recent/un clotted blood. Midline shift measures 9 mm. Chronic infarcts along the right cerebral convexity and a roughly watershed distribution on prior. No entrapment. Vascular: No hyperdense vessel or unexpected calcification. Skull: Normal. Negative for fracture or focal lesion. Sinuses/Orbits: No acute finding. Other: Critical Value/emergent results were called by telephone at the time of interpretation on 06/19/2023 at 10:04 am to provider [facility] Tenia Goh , who verbally acknowledged these results. IMPRESSION: 1. Acute subdural hematoma on the right measuring up to 2.1 cm in thickness with 9 mm of midline shift. 2. Chronic infarcts most extensive in the right MCA and watershed distribution. Electronically Signed   By: Tiburcio Pea M.D.   On: 06/19/2023 10:11    Procedures Procedures  {Document cardiac monitor, telemetry assessment procedure when appropriate:1}  Medications Ordered in ED Medications  prothrombin complex conc human (KCENTRA) IVPB 1,649 Units (has no administration in time range)  phytonadione (VITAMIN K) 10 mg in dextrose 5 % 50 mL IVPB (has no administration in time range)  sodium  chloride 0.9 % bolus 500 mL (500 mLs Intravenous Bolus 06/19/23 0923)  metoCLOPramide (REGLAN) injection 10 mg (10 mg Intravenous Given 06/19/23 1308)  HYDROmorphone (DILAUDID) injection 0.5 mg (0.5 mg Intravenous Given 06/19/23 6578)    ED Course/ Medical Decision Making/ A&P   {  CRITICAL CARE Performed by: Bethann Berkshire Total critical care time: 55 minutes Critical care time was exclusive of separately billable procedures and treating other patients. Critical care was necessary to treat or prevent imminent or life-threatening deterioration. Critical care was time spent personally by me on the following activities: development of treatment plan with patient and/or surrogate as well as nursing, discussions with consultants, evaluation of patient's response to treatment, examination of patient, obtaining history from patient or surrogate, ordering and performing treatments and interventions, ordering and review of laboratory studies, ordering and review of radiographic studies, pulse oximetry and re-evaluation of patient's condition.   Patient with a large subdural hematoma.  I spoke to neurosurgery Dr. Danielle Dess and he requested the patient be transferred to [facility] under the critical care service.  I spoke with Dr. Everardo All of critical care and she excepted the patient Click here for ABCD2, HEART and other calculatorsREFRESH Note before signing :1}                              Medical Decision Making Amount and/or Complexity of Data Reviewed Labs: ordered. Radiology: ordered. ECG/medicine tests: ordered.  Risk Prescription drug management. Decision regarding hospitalization.   Fall with subdural hematoma.  Patient is being transferred to [facility] and will be under the critical care service along with seeing Dr. Danielle Dess  {Document critical care time when appropriate:1} {Document review of labs and clinical decision tools ie heart score, Chads2Vasc2 etc:1}  {Document  your independent review of radiology images, and any outside records:1} {Document your discussion with family members, caretakers, and with consultants:1} {Document social determinants of health affecting pt's care:1} {Document your decision making why or why not admission, treatments were needed:1} Final Clinical Impression(s) / ED Diagnoses Final diagnoses:  None    Rx / DC Orders ED Discharge Orders     None

## 2023-06-19 NOTE — ED Notes (Signed)
ED TO INPATIENT HANDOFF REPORT  ED Nurse Name and Phone #:  Haig Prophet RN [phone]  S Name/Age/Gender Lauren [name] 43 y.o. female Room/Bed: APA11/APA11  Code Status   Code Status: Prior  Home/SNF/Other Home Patient oriented to: self, place, time, and situation Is this baseline? Yes   Triage Complete: Triage complete  Chief Complaint Subdural hematoma (HCC) [S06.5XAA]  Triage Note Patient BIB [facility], Called out for for a fall and hit her head on the floor. Denise Loss of consciousness. Patient take coumadin, hx of stroke with Left sided weakness.   Allergies Allergies  Allergen Reactions   Tape Other (See Comments)    Skin comes off with any adhesive   Visipaque [Iodixanol] Nausea And Vomiting    Patient had vagal response during procedure became hypertensive, and bradycardic. CO2 used during procedure prior to contrast being used, this may be cause as well. Recommended premedicating prior to contrast being used in the future.    Wellbutrin [Bupropion] Hives   Gabapentin     dizzy   Ciprofloxacin Hcl Hives and Rash    Hives/rash at injection site; 01/15/22 tolerated IV cipro    Level of Care/Admitting Diagnosis ED Disposition     ED Disposition  Admit   Condition  --   Comment  Hospital Area: [facility] [[id number]]  Level of Care: ICU [6]  May admit patient to [facility] or [facility] if equivalent level of care is available:: No  Interfacility transfer: Yes  Covid Evaluation: Asymptomatic - no recent exposure (last 10 days) testing not required  Diagnosis: Subdural hematoma [facility]) [[id number]]  Admitting Physician: Luciano Cutter [[id number]]  Attending Physician: Luciano Cutter [phone]  Certification:: I certify this patient will need inpatient services for at least 2 midnights  Expected Medical Readiness: 06/27/2023          B Medical/Surgery History Past Medical History:  Diagnosis Date   Acute ischemic stroke (HCC) 01/01/2020    Acute kidney injury superimposed on CKD (HCC) 01/01/2020   Acute right MCA stroke (HCC) 01/28/2022   Anemia    CAD (coronary artery disease)    a. s/p cath in 03/2014 showing 30% mid-LAD, moderate to severe disease along small D1, patent LCx, moderate to severe distal OM2 stenosis and moderate diffuse diease along RCA not amenable to PCI   Cerebrovascular accident (CVA) (HCC) 12/31/2019   CHF (congestive heart failure) (HCC)    a. EF 55-60% in 12/2019 b. EF at 35-40% by echo in 05/2020   CKD (chronic kidney disease) stage 4, GFR 15-29 ml/min (HCC)    Diabetes mellitus without complication (HCC)    Myocardial infarction (HCC)    Neuropathy    PONV (postoperative nausea and vomiting)    PVD (peripheral vascular disease) (HCC)    Stroke (HCC) 01/2022   L hand weakness   Past Surgical History:  Procedure Laterality Date   ABDOMINAL AORTOGRAM W/LOWER EXTREMITY N/A 06/10/2022   Procedure: ABDOMINAL AORTOGRAM W/LOWER EXTREMITY;  Surgeon: Cephus Shelling, MD;  [facility]: [facility];  Service: Cardiovascular;  Laterality: N/A;   ABDOMINAL AORTOGRAM W/LOWER EXTREMITY Right 05/26/2023   Procedure: ABDOMINAL AORTOGRAM W/LOWER EXTREMITY;  Surgeon: Cephus Shelling, MD;  Location: [facility];  Service: Cardiovascular;  Laterality: Right;   AMPUTATION Left 09/02/2021   Procedure: AMPUTATION RAY;  Surgeon: Edwin Cap, DPM;  Location: [facility] OR;  Service: Podiatry;  Laterality: Left;  sagittal saw, 3L bag saline & Pulse   BONE BIOPSY  Right 05/25/2023   Procedure: BONE BIOPSY OF RIGHT GREAT TOE;  Surgeon: Pilar Plate, DPM;  Location: [facility] OR;  Service: Orthopedics/Podiatry;  Laterality: Right;  Great toe bone biopsy versus amputation   Cardiac catherization     CHOLECYSTECTOMY     FEMORAL-TIBIAL BYPASS GRAFT Right 06/01/2023   Procedure: RIGHT LEG BYPASS GRAFT SUPERFICIAL FEMORAL-ANTERIOR TIBIAL ARTERY WITH RIGHT NON-REVERSED GREATER SAPHENOUS VEIN;  Surgeon: Cephus Shelling, MD;  Location: [facility];  Service: Vascular;  Laterality: Right;   I & D EXTREMITY Left 08/30/2021   Procedure: IRRIGATION AND DEBRIDEMENT WITH BONE BIOPSY;  Surgeon: Candelaria Stagers, DPM;  Location: [facility];  Service: Podiatry;  Laterality: Left;   IR ANGIO VERTEBRAL SEL SUBCLAVIAN INNOMINATE UNI L MOD SED  01/18/2022   IR CT HEAD LTD  01/08/2022   IR INTRA CRAN STENT  01/08/2022   IR PERCUTANEOUS ART THROMBECTOMY/INFUSION INTRACRANIAL INC DIAG ANGIO  01/08/2022   IRRIGATION AND DEBRIDEMENT FOOT Left 09/04/2021   Procedure: IRRIGATION AND DEBRIDEMENT FOOT AND CLOSURE;  Surgeon: Edwin Cap, DPM;  Location: [facility];  Service: Podiatry;  Laterality: Left;   LEFT HEART CATH AND CORONARY ANGIOGRAPHY N/A 07/26/2022   Procedure: LEFT HEART CATH AND CORONARY ANGIOGRAPHY;  Surgeon: Swaziland, Peter M, MD;  Location: [facility] INVASIVE CV LAB;  Service: Cardiovascular;  Laterality: N/A;   METATARSAL OSTEOTOMY Left 07/21/2022   Procedure: LEFT FOOT EXCISION OF FIFTH METATARSAL WITH DEBRIDEMENT OF ULCER, POSSIBLE BONE BIOPSY OF THE FOURTH METATARSAL, POSSIBLE FOURTH RAY AMPUTATION OF LEFT TOEAND METATARSAL;  Surgeon: Vivi Barrack, DPM;  Location[facility];  Service: Podiatry;  Laterality: Left;   PERIPHERAL VASCULAR BALLOON ANGIOPLASTY Left 06/10/2022   Procedure: PERIPHERAL VASCULAR BALLOON ANGIOPLASTY;  Surgeon: Cephus Shelling, MD;  [facility];  Service: Cardiovascular;  Laterality: Left;  AT and DP   PERIPHERAL VASCULAR INTERVENTION Left 06/10/2022   Procedure: PERIPHERAL VASCULAR INTERVENTION;  Surgeon: Cephus Shelling, MD;  Location: [facility] INVASIVE CV LAB;  Service: Cardiovascular;  Laterality: Left;  SFA   RADIOLOGY WITH ANESTHESIA N/A 01/08/2022   Procedure: IR WITH ANESTHESIA;  Surgeon: Radiologist, Medication, MD;  [facility];  Service: Radiology;  Laterality: N/A;   THROMBECTOMY BRACHIAL ARTERY Right 11/18/2021   Procedure: RIGHT BRACHIAL, RADIAL, & ULNAR  ARTERY THROMBECTOMY.;  Surgeon: Cephus Shelling, MD;  [facility];  Service: Vascular;  Laterality: Right;   TUBAL LIGATION     VEIN HARVEST Right 06/01/2023   Procedure: VEIN HARVEST OF RIGHT GREATER SAPHENOUS VEIN;  Surgeon: Cephus Shelling, MD;  [facility];  Service: Vascular;  Laterality: Right;     A IV Location/Drains/Wounds Patient Lines/Drains/Airways Status     Active Line/Drains/Airways     Name Placement date Placement time Site Days   Peripheral IV 06/19/23 20 G 1.88" Anterior;Right Forearm 06/19/23  0913  Forearm  less than 1   Wound / Incision (Open or Dehisced) Other (Comment) Foot Left;Posterior --  --  Foot  --   Wound / Incision (Open or Dehisced) 06/01/23 Irritant Dermatitis (Moisture Associated Skin Damage) Breast Upper;Bilateral under bilateral breast, bilateral groin, pubic area pink, dry 06/01/23  2010  Breast  18            Intake/Output Last 24 hours No intake or output data in the 24 hours ending 06/19/23 1154  Labs/Imaging Results for orders placed or performed during the hospital encounter of 06/19/23 (from the past 48 hours)  CBG monitoring, ED  Status: Abnormal   Collection Time: 06/19/23  9:21 AM  Result Value Ref Range   Glucose-Capillary 212 (H) 70 - 99 mg/dL    Comment: Glucose reference range applies only to samples taken after fasting for at least 8 hours.  CBC with Differential     Status: Abnormal   Collection Time: 06/19/23 10:10 AM  Result Value Ref Range   WBC 19.0 (H) 4.0 - 10.5 K/uL    Comment: REPEATED TO VERIFY WHITE COUNT CONFIRMED ON SMEAR    RBC 4.11 3.87 - 5.11 MIL/uL   Hemoglobin 11.7 (L) 12.0 - 15.0 g/dL   HCT 82.9 56.2 - 13.0 %   MCV 89.8 80.0 - 100.0 fL   MCH 28.5 26.0 - 34.0 pg   MCHC 31.7 30.0 - 36.0 g/dL   RDW 86.5 (H) 78.4 - 69.6 %   Platelets 389 150 - 400 K/uL   nRBC 0.0 0.0 - 0.2 %    Comment: REPEATED TO VERIFY   Neutrophils Relative % 89 %   Neutro Abs 16.9 (H) 1.7 - 7.7 K/uL    Lymphocytes Relative 6 %   Lymphs Abs 1.1 0.7 - 4.0 K/uL   Monocytes Relative 4 %   Monocytes Absolute 0.7 0.1 - 1.0 K/uL   Eosinophils Relative 0 %   Eosinophils Absolute 0.1 0.0 - 0.5 K/uL   Basophils Relative 0 %   Basophils Absolute 0.0 0.0 - 0.1 K/uL   WBC Morphology Moderate Left Shift (>5% metas and myelos)    RBC Morphology MORPHOLOGY UNREMARKABLE    Smear Review MORPHOLOGY UNREMARKABLE    Immature Granulocytes 1 %   Abs Immature Granulocytes 0.10 (H) 0.00 - 0.07 K/uL   Hypersegmented Neutrophils PRESENT     Comment: Performed at [facility], [address], [place], [place] [address]  Comprehensive metabolic panel     Status: Abnormal   Collection Time: 06/19/23 10:10 AM  Result Value Ref Range   Sodium 134 (L) 135 - 145 mmol/L   Potassium 3.7 3.5 - 5.1 mmol/L   Chloride 101 98 - 111 mmol/L   CO2 21 (L) 22 - 32 mmol/L   Glucose, Bld 237 (H) 70 - 99 mg/dL    Comment: Glucose reference range applies only to samples taken after fasting for at least 8 hours.   BUN 18 6 - 20 mg/dL   Creatinine, Ser 4.13 (H) 0.44 - 1.00 mg/dL   Calcium 8.1 (L) 8.9 - 10.3 mg/dL   Total Protein 6.4 (L) 6.5 - 8.1 g/dL   Albumin 2.7 (L) 3.5 - 5.0 g/dL   AST 17 15 - 41 U/L   ALT 11 0 - 44 U/L   Alkaline Phosphatase 139 (H) 38 - 126 U/L   Total Bilirubin 1.0 0.0 - 1.2 mg/dL   GFR, Estimated 26 (L) >60 mL/min    Comment: (NOTE) Calculated using the CKD-EPI Creatinine Equation (2021)    Anion gap 12 5 - 15    Comment: Performed at [facility], [address] [place], [place] [address]  Protime-INR     Status: Abnormal   Collection Time: 06/19/23 10:10 AM  Result Value Ref Range   Prothrombin Time 66.0 (H) 11.4 - 15.2 seconds   INR 7.8 (HH) 0.8 - 1.2    Comment: REPEATED TO VERIFY CRITICAL RESULT CALLED TO, READ BACK BY AND VERIFIED WITH: SMALLWOOD @ 1051 ON [id number] BY HENDERSON L (NOTE) INR goal varies based on device and disease states. Performed at [facility], [address],  [place], [place] [address]   hCG, serum, qualitative     Status: None   Collection Time: 06/19/23 10:10 AM  Result Value Ref Range   Preg, Serum NEGATIVE NEGATIVE    Comment:        THE SENSITIVITY OF THIS METHODOLOGY IS >10 mIU/mL. Performed at [facility], [address] [place], [place] [address]    CT CHEST ABDOMEN PELVIS WO CONTRAST Result Date: 06/19/2023 CLINICAL DATA:  Status post fall. EXAM: CT CHEST, ABDOMEN AND PELVIS WITHOUT CONTRAST TECHNIQUE: Multidetector CT imaging of the chest, abdomen and pelvis was performed following the standard protocol without IV contrast. RADIATION DOSE REDUCTION: This exam was performed according to the departmental dose-optimization program which includes automated exposure control, adjustment of the mA and/or kV according to patient size and/or use of iterative reconstruction technique. COMPARISON:  CT chest 08/12/2022 and CT AP 08/03/2022 FINDINGS: CT CHEST FINDINGS Cardiovascular: Cardiac enlargement. No significant pericardial effusion. Aortic atherosclerosis and coronary artery calcification. Mediastinum/Nodes: No enlarged mediastinal, hilar, or axillary lymph nodes. Thyroid gland, trachea, and esophagus demonstrate no significant findings. Lungs/Pleura: No pleural effusion, airspace consolidation, atelectasis or pneumothorax. Scattered lung nodules are again noted including: -4 mm nodule in the superior segment of right lower lobe, image 47/6. Unchanged. -Subpleural nodule overlying the lateral left lower lobe measures 4 mm and is unchanged in the interval, image 83/6. -Posterior right lower lobe lung nodule measures 0.9 cm, image 63/6. Previously 1 cm. Additional small scattered lung nodules are also stable in the interval. Musculoskeletal: Again seen is a comminuted, impacted fracture deformity involving the proximal left humerus. The fracture lines appear distinct however there is signs of increased surrounding callus formation suggesting at least a  subacute fracture. No new fractures identified. CT ABDOMEN PELVIS FINDINGS Hepatobiliary: No focal liver abnormality is seen. Status post cholecystectomy. No biliary dilatation. Pancreas: Unremarkable. No pancreatic ductal dilatation or surrounding inflammatory changes. Spleen: Normal in size without focal abnormality. Adrenals/Urinary Tract: Normal adrenal glands. No nephrolithiasis, hydronephrosis or mass. Bilateral renal vascular type calcifications noted. Bladder appears normal. Stomach/Bowel: Stomach is within normal limits. The appendix is visualized and appears normal. No bowel wall thickening, inflammation, or distension. Vascular/Lymphatic: Aortic atherosclerosis. No abdominopelvic adenopathy. Reproductive: Uterus and bilateral adnexa are unremarkable. Other: No free fluid or fluid collections. No signs of pneumoperitoneum. Musculoskeletal: No acute or significant osseous findings. Multiple injection site granulomas identified within the lower ventral abdominal wall. Within the proximal right medial thigh there is an focal fluid collection measuring approximately 2.6 x 1.7 cm, image 139/3. There is associated overlying soft tissue stranding. IMPRESSION: 1. No acute findings within the chest, abdomen or pelvis. 2. Again seen is a comminuted, impacted fracture deformity involving the proximal left humerus. The fracture lines appear distinct however there is signs of increased surrounding callus formation suggesting at least a subacute fracture. 3. Within the proximal right medial thigh there is an focal fluid collection measuring approximately 2.6 x 1.7 cm. There is associated overlying soft tissue stranding. Findings are favored to represent a posttraumatic seroma or hematoma. 4. Stable appearance of scattered lung nodules. 5. Coronary artery calcifications. 6.  Aortic Atherosclerosis (ICD10-I70.0). Electronically Signed   By: Signa Kell M.D.   On: 06/19/2023 10:18   CT Cervical Spine Wo  Contrast Result Date: 06/19/2023 CLINICAL DATA:  Fall striking head on floor, right subdural hematoma EXAM: CT CERVICAL SPINE WITHOUT CONTRAST TECHNIQUE: Multidetector CT imaging of the cervical spine was performed without intravenous contrast. Multiplanar CT image reconstructions were also  generated. RADIATION DOSE REDUCTION: This exam was performed according to the departmental dose-optimization program which includes automated exposure control, adjustment of the mA and/or kV according to patient size and/or use of iterative reconstruction technique. COMPARISON:  03/13/2023 FINDINGS: Alignment: No vertebral subluxation is observed. Skull base and vertebrae: No fracture or acute bony findings. Soft tissues and spinal canal: Right ICA stent. Acute subdural hematoma noted along the right middle cranial fossa. This will be explored in further detail in the head CT report. Disc levels: Small central disc protrusion with associated spurring at C4-5 causing borderline central narrowing of the thecal sac. Otherwise no impingement. Upper chest: Unremarkable where included. Other: No supplemental non-categorized findings. IMPRESSION: 1. No acute cervical spine findings. 2. Small central disc protrusion with associated spurring at C4-5 causing borderline central narrowing of the thecal sac. 3. Acute subdural hematoma along the right middle cranial fossa. This will be exploration in further detail in the head CT report. Electronically Signed   By: Gaylyn Rong M.D.   On: 06/19/2023 10:12   CT Head Wo Contrast Result Date: 06/19/2023 CLINICAL DATA:  Head trauma with abnormal mental status EXAM: CT HEAD WITHOUT CONTRAST TECHNIQUE: Contiguous axial images were obtained from the base of the skull through the vertex without intravenous contrast. RADIATION DOSE REDUCTION: This exam was performed according to the departmental dose-optimization program which includes automated exposure control, adjustment of the mA and/or  kV according to patient size and/or use of iterative reconstruction technique. COMPARISON:  05/27/2023 FINDINGS: Brain: Acute subdural hematoma along the right cerebral convexity measuring up to 2.2 cm in the parietal region with mild tentorial and inter hemispheric component. Very heterogeneous hematoma with low-density areas suggesting recent/un clotted blood. Midline shift measures 9 mm. Chronic infarcts along the right cerebral convexity and a roughly watershed distribution on prior. No entrapment. Vascular: No hyperdense vessel or unexpected calcification. Skull: Normal. Negative for fracture or focal lesion. Sinuses/Orbits: No acute finding. Other: Critical Value/emergent results were called by telephone at the time of interpretation on 06/19/2023 at 10:04 am to provider [facility] ZAMMIT , who verbally acknowledged these results. IMPRESSION: 1. Acute subdural hematoma on the right measuring up to 2.1 cm in thickness with 9 mm of midline shift. 2. Chronic infarcts most extensive in the right MCA and watershed distribution. Electronically Signed   By: Tiburcio Pea M.D.   On: 06/19/2023 10:11    Pending Labs Unresulted Labs (From admission, onward)     Start     Ordered   06/20/23 0500  Protime-INR  Daily,   R      06/19/23 1042   06/19/23 1200  Protime-INR  Once-Timed,   STAT        06/19/23 1042   06/19/23 0830  Urinalysis, Routine w reflex microscopic -Urine, Clean Catch  Once,   URGENT       Question:  Specimen Source  Answer:  Urine, Clean Catch   06/19/23 0829            Vitals/Pain Today's Vitals   06/19/23 1115 06/19/23 1118 06/19/23 1130 06/19/23 1143  BP: (!) 96/51  120/64   Pulse: 84  82   Resp: 14  14   Temp:      TempSrc:      SpO2: 99%  100%   Weight:      Height:      PainSc:  4   Asleep    Isolation Precautions No active isolations  Medications Medications  phytonadione (VITAMIN K) 10  mg in dextrose 5 % 50 mL IVPB (10 mg Intravenous New Bag/Given 06/19/23  1143)  sodium chloride 0.9 % bolus 500 mL (500 mLs Intravenous Bolus 06/19/23 0923)  metoCLOPramide (REGLAN) injection 10 mg (10 mg Intravenous Given 06/19/23 0922)  HYDROmorphone (DILAUDID) injection 0.5 mg (0.5 mg Intravenous Given 06/19/23 0923)  prothrombin complex conc human (KCENTRA) IVPB 1,649 Units (1,649 Units Intravenous New Bag/Given 06/19/23 1118)    Mobility walks with device     Focused Assessments Neuro Assessment Handoff:  Swallow screen pass? Yes  Cardiac Rhythm: Normal sinus rhythm       Neuro Assessment:   Neuro Checks:      Has TPA been given? No If patient is a Neuro Trauma and patient is going to OR before floor call report to 4N Charge nurse: [phone] or [phone]   R Recommendations: See Admitting Provider Note  Report given to:   Additional Notes: Patient has left sided weakness from a previous stroke. Restricted band applied to Left arm. Ultrasound guided IV.

## 2023-06-19 NOTE — Consult Note (Signed)
Reason for Consult: Acute subdural hematoma Referring Physician: [name] is an 43 y.o. female.  HPI: I was contacted at 10:30 AM by Dr. Estell Harpin regarding the presence of Lauren [name] in the any [facility] emergency department.  She apparently had sustained a fall.  She is on Coumadin anticoagulation for history of stroke and coronary artery disease.  A CT scan demonstrates presence of a large right mixed density subdural hematoma with several loculation suggesting some chronic features.  It appears that the patient was in the emergency department at the end of January with a CT scan that demonstrated that the patient has had a previous right-sided stroke with ex vacuo changes.  The patient is described by Dr. Estell Harpin as being awake and alert and moving extremities reasonably well.  I have noted to Dr. Estell Harpin that based on the size of this collection and the fact that she is anticoagulated she should have her Coumadin reversed and she should be transferred here for critical care monitoring and possible consideration of surgery should there be deterioration in her neurologic status.  Past Medical History:  Diagnosis Date   Acute ischemic stroke (HCC) 01/01/2020   Acute kidney injury superimposed on CKD (HCC) 01/01/2020   Acute right MCA stroke (HCC) 01/28/2022   Anemia    CAD (coronary artery disease)    a. s/p cath in 03/2014 showing 30% mid-LAD, moderate to severe disease along small D1, patent LCx, moderate to severe distal OM2 stenosis and moderate diffuse diease along RCA not amenable to PCI   Cerebrovascular accident (CVA) (HCC) 12/31/2019   CHF (congestive heart failure) (HCC)    a. EF 55-60% in 12/2019 b. EF at 35-40% by echo in 05/2020   CKD (chronic kidney disease) stage 4, GFR 15-29 ml/min (HCC)    Diabetes mellitus without complication (HCC)    Myocardial infarction (HCC)    Neuropathy    PONV (postoperative nausea and vomiting)    PVD (peripheral vascular disease) (HCC)     Stroke (HCC) 01/2022   L hand weakness    Past Surgical History:  Procedure Laterality Date   ABDOMINAL AORTOGRAM W/LOWER EXTREMITY N/A 06/10/2022   Procedure: ABDOMINAL AORTOGRAM W/LOWER EXTREMITY;  Surgeon: Cephus Shelling, MD;  Location: [facility];  Service: Cardiovascular;  Laterality: N/A;   ABDOMINAL AORTOGRAM W/LOWER EXTREMITY Right 05/26/2023   Procedure: ABDOMINAL AORTOGRAM W/LOWER EXTREMITY;  Surgeon: Cephus Shelling, MD;  Location: [facility];  Service: Cardiovascular;  Laterality: Right;   AMPUTATION Left 09/02/2021   Procedure: AMPUTATION RAY;  Surgeon: Edwin Cap, DPM;  Location: [facility] OR;  Service: Podiatry;  Laterality: Left;  sagittal saw, 3L bag saline & Pulse   BONE BIOPSY Right 05/25/2023   Procedure: BONE BIOPSY OF RIGHT GREAT TOE;  Surgeon: Pilar Plate, DPM;  Location: [facility];  Service: Orthopedics/Podiatry;  Laterality: Right;  Great toe bone biopsy versus amputation   Cardiac catherization     CHOLECYSTECTOMY     FEMORAL-TIBIAL BYPASS GRAFT Right 06/01/2023   Procedure: RIGHT LEG BYPASS GRAFT SUPERFICIAL FEMORAL-ANTERIOR TIBIAL ARTERY WITH RIGHT NON-REVERSED GREATER SAPHENOUS VEIN;  Surgeon: Cephus Shelling, MD;  Location: [facility];  Service: Vascular;  Laterality: Right;   I & D EXTREMITY Left 08/30/2021   Procedure: IRRIGATION AND DEBRIDEMENT WITH BONE BIOPSY;  Surgeon: Candelaria Stagers, DPM;  Location: [facility];  Service: Podiatry;  Laterality: Left;   IR ANGIO VERTEBRAL SEL SUBCLAVIAN INNOMINATE UNI L MOD SED  01/18/2022  IR CT HEAD LTD  01/08/2022   IR INTRA CRAN STENT  01/08/2022   IR PERCUTANEOUS ART THROMBECTOMY/INFUSION INTRACRANIAL INC DIAG ANGIO  01/08/2022   IRRIGATION AND DEBRIDEMENT FOOT Left 09/04/2021   Procedure: IRRIGATION AND DEBRIDEMENT FOOT AND CLOSURE;  Surgeon: Edwin Cap, DPM;  Location: [facility];  Service: Podiatry;  Laterality: Left;   LEFT HEART CATH AND CORONARY ANGIOGRAPHY N/A 07/26/2022    Procedure: LEFT HEART CATH AND CORONARY ANGIOGRAPHY;  Surgeon: [place], Peter M, MD;  Location: [facility] INVASIVE CV LAB;  Service: Cardiovascular;  Laterality: N/A;   METATARSAL OSTEOTOMY Left 07/21/2022   Procedure: LEFT FOOT EXCISION OF FIFTH METATARSAL WITH DEBRIDEMENT OF ULCER, POSSIBLE BONE BIOPSY OF THE FOURTH METATARSAL, POSSIBLE FOURTH RAY AMPUTATION OF LEFT TOEAND METATARSAL;  Surgeon: Vivi Barrack, DPM;  Location: [facility];  Service: Podiatry;  Laterality: Left;   PERIPHERAL VASCULAR BALLOON ANGIOPLASTY Left 06/10/2022   Procedure: PERIPHERAL VASCULAR BALLOON ANGIOPLASTY;  Surgeon: Cephus Shelling, MD;  [facility]: [facility];  Service: Cardiovascular;  Laterality: Left;  AT and DP   PERIPHERAL VASCULAR INTERVENTION Left 06/10/2022   Procedure: PERIPHERAL VASCULAR INTERVENTION;  Surgeon: Cephus Shelling, MD;  Location: [facility] INVASIVE CV LAB;  Service: Cardiovascular;  Laterality: Left;  SFA   RADIOLOGY WITH ANESTHESIA N/A 01/08/2022   Procedure: IR WITH ANESTHESIA;  Surgeon: Radiologist, Medication, MD;  Location: [facility];  Service: Radiology;  Laterality: N/A;   THROMBECTOMY BRACHIAL ARTERY Right 11/18/2021   Procedure: RIGHT BRACHIAL, RADIAL, & ULNAR ARTERY THROMBECTOMY.;  Surgeon: Cephus Shelling, MD;  Location: [facility];  Service: Vascular;  Laterality: Right;   TUBAL LIGATION     VEIN HARVEST Right 06/01/2023   Procedure: VEIN HARVEST OF RIGHT GREATER SAPHENOUS VEIN;  Surgeon: Cephus Shelling, MD;  Location: [facility];  Service: Vascular;  Laterality: Right;    Family History  Problem Relation Age of Onset   Thyroid disease Mother    Hypertension Mother    Hyperlipidemia Mother    CAD Mother    CVA Mother    Hyperlipidemia Father    Hypertension Father    CAD Father    Breast cancer Paternal Grandmother 28   Cancer Paternal Grandmother        lung    Social History:  reports that she has been smoking cigarettes. She started smoking about 16 years ago. She has a  15 pack-year smoking history. She has been exposed to tobacco smoke. She has never used smokeless tobacco. She reports current alcohol use. She reports that she does not use drugs.  Allergies:  Allergies  Allergen Reactions   Tape Other (See Comments)    Skin comes off with any adhesive   Visipaque [Iodixanol] Nausea And Vomiting    Patient had vagal response during procedure became hypertensive, and bradycardic. CO2 used during procedure prior to contrast being used, this may be cause as well. Recommended premedicating prior to contrast being used in the future.    Wellbutrin [Bupropion] Hives   Gabapentin     dizzy   Ciprofloxacin Hcl Hives and Rash    Hives/rash at injection site; 01/15/22 tolerated IV cipro    Medications: At this time I am aware of the patient's anticoagulation status being on Coumadin which will be reversed per Dr. Estell Harpin.  I am not aware of the patient's other medications.  Results for orders placed or performed during the hospital encounter of 06/19/23 (from the past 48 hours)  CBG monitoring, ED  Status: Abnormal   Collection Time: 06/19/23  9:21 AM  Result Value Ref Range   Glucose-Capillary 212 (H) 70 - 99 mg/dL    Comment: Glucose reference range applies only to samples taken after fasting for at least 8 hours.  CBC with Differential     Status: Abnormal (Preliminary result)   Collection Time: 06/19/23 10:10 AM  Result Value Ref Range   WBC PENDING 4.0 - 10.5 K/uL   RBC 4.11 3.87 - 5.11 MIL/uL   Hemoglobin 11.7 (L) 12.0 - 15.0 g/dL   HCT 16.1 09.6 - 04.5 %   MCV 89.8 80.0 - 100.0 fL   MCH 28.5 26.0 - 34.0 pg   MCHC 31.7 30.0 - 36.0 g/dL   RDW 40.9 (H) 81.1 - 91.4 %   Platelets 389 150 - 400 K/uL    Comment: Performed at [facility], [address], [place], [place] [address]   nRBC PENDING 0.0 - 0.2 %   Neutrophils Relative % PENDING %   Neutro Abs PENDING 1.7 - 7.7 K/uL   Band Neutrophils PENDING %   Lymphocytes Relative PENDING %   Lymphs Abs  PENDING 0.7 - 4.0 K/uL   Monocytes Relative PENDING %   Monocytes Absolute PENDING 0.1 - 1.0 K/uL   Eosinophils Relative PENDING %   Eosinophils Absolute PENDING 0.0 - 0.5 K/uL   Basophils Relative PENDING %   Basophils Absolute PENDING 0.0 - 0.1 K/uL   WBC Morphology PENDING    RBC Morphology PENDING    Smear Review PENDING    Other PENDING %   nRBC PENDING 0 /100 WBC   Metamyelocytes Relative PENDING %   Myelocytes PENDING %   Promyelocytes Relative PENDING %   Blasts PENDING %   Immature Granulocytes PENDING %   Abs Immature Granulocytes PENDING 0.00 - 0.07 K/uL  Comprehensive metabolic panel     Status: Abnormal   Collection Time: 06/19/23 10:10 AM  Result Value Ref Range   Sodium 134 (L) 135 - 145 mmol/L   Potassium 3.7 3.5 - 5.1 mmol/L   Chloride 101 98 - 111 mmol/L   CO2 21 (L) 22 - 32 mmol/L   Glucose, Bld 237 (H) 70 - 99 mg/dL    Comment: Glucose reference range applies only to samples taken after fasting for at least 8 hours.   BUN 18 6 - 20 mg/dL   Creatinine, Ser 6.21 (H) 0.44 - 1.00 mg/dL   Calcium 8.1 (L) 8.9 - 10.3 mg/dL   Total Protein 6.4 (L) 6.5 - 8.1 g/dL   Albumin 2.7 (L) 3.5 - 5.0 g/dL   AST 17 15 - 41 U/L   ALT 11 0 - 44 U/L   Alkaline Phosphatase 139 (H) 38 - 126 U/L   Total Bilirubin 1.0 0.0 - 1.2 mg/dL   GFR, Estimated 26 (L) >60 mL/min    Comment: (NOTE) Calculated using the CKD-EPI Creatinine Equation (2021)    Anion gap 12 5 - 15    Comment: Performed at [facility], [address], [place], [place] [address]    CT CHEST ABDOMEN PELVIS WO CONTRAST Result Date: 06/19/2023 CLINICAL DATA:  Status post fall. EXAM: CT CHEST, ABDOMEN AND PELVIS WITHOUT CONTRAST TECHNIQUE: Multidetector CT imaging of the chest, abdomen and pelvis was performed following the standard protocol without IV contrast. RADIATION DOSE REDUCTION: This exam was performed according to the departmental dose-optimization program which includes automated exposure control,  adjustment of the mA and/or kV according to patient size and/or  use of iterative reconstruction technique. COMPARISON:  CT chest 08/12/2022 and CT AP 08/03/2022 FINDINGS: CT CHEST FINDINGS Cardiovascular: Cardiac enlargement. No significant pericardial effusion. Aortic atherosclerosis and coronary artery calcification. Mediastinum/Nodes: No enlarged mediastinal, hilar, or axillary lymph nodes. Thyroid gland, trachea, and esophagus demonstrate no significant findings. Lungs/Pleura: No pleural effusion, airspace consolidation, atelectasis or pneumothorax. Scattered lung nodules are again noted including: -4 mm nodule in the superior segment of right lower lobe, image 47/6. Unchanged. -Subpleural nodule overlying the lateral left lower lobe measures 4 mm and is unchanged in the interval, image 83/6. -Posterior right lower lobe lung nodule measures 0.9 cm, image 63/6. Previously 1 cm. Additional small scattered lung nodules are also stable in the interval. Musculoskeletal: Again seen is a comminuted, impacted fracture deformity involving the proximal left humerus. The fracture lines appear distinct however there is signs of increased surrounding callus formation suggesting at least a subacute fracture. No new fractures identified. CT ABDOMEN PELVIS FINDINGS Hepatobiliary: No focal liver abnormality is seen. Status post cholecystectomy. No biliary dilatation. Pancreas: Unremarkable. No pancreatic ductal dilatation or surrounding inflammatory changes. Spleen: Normal in size without focal abnormality. Adrenals/Urinary Tract: Normal adrenal glands. No nephrolithiasis, hydronephrosis or mass. Bilateral renal vascular type calcifications noted. Bladder appears normal. Stomach/Bowel: Stomach is within normal limits. The appendix is visualized and appears normal. No bowel wall thickening, inflammation, or distension. Vascular/Lymphatic: Aortic atherosclerosis. No abdominopelvic adenopathy. Reproductive: Uterus and bilateral  adnexa are unremarkable. Other: No free fluid or fluid collections. No signs of pneumoperitoneum. Musculoskeletal: No acute or significant osseous findings. Multiple injection site granulomas identified within the lower ventral abdominal wall. Within the proximal right medial thigh there is an focal fluid collection measuring approximately 2.6 x 1.7 cm, image 139/3. There is associated overlying soft tissue stranding. IMPRESSION: 1. No acute findings within the chest, abdomen or pelvis. 2. Again seen is a comminuted, impacted fracture deformity involving the proximal left humerus. The fracture lines appear distinct however there is signs of increased surrounding callus formation suggesting at least a subacute fracture. 3. Within the proximal right medial thigh there is an focal fluid collection measuring approximately 2.6 x 1.7 cm. There is associated overlying soft tissue stranding. Findings are favored to represent a posttraumatic seroma or hematoma. 4. Stable appearance of scattered lung nodules. 5. Coronary artery calcifications. 6.  Aortic Atherosclerosis (ICD10-I70.0). Electronically Signed   By: Signa Kell M.D.   On: 06/19/2023 10:18   CT Cervical Spine Wo Contrast Result Date: 06/19/2023 CLINICAL DATA:  Fall striking head on floor, right subdural hematoma EXAM: CT CERVICAL SPINE WITHOUT CONTRAST TECHNIQUE: Multidetector CT imaging of the cervical spine was performed without intravenous contrast. Multiplanar CT image reconstructions were also generated. RADIATION DOSE REDUCTION: This exam was performed according to the departmental dose-optimization program which includes automated exposure control, adjustment of the mA and/or kV according to patient size and/or use of iterative reconstruction technique. COMPARISON:  03/13/2023 FINDINGS: Alignment: No vertebral subluxation is observed. Skull base and vertebrae: No fracture or acute bony findings. Soft tissues and spinal canal: Right ICA stent. Acute  subdural hematoma noted along the right middle cranial fossa. This will be explored in further detail in the head CT report. Disc levels: Small central disc protrusion with associated spurring at C4-5 causing borderline central narrowing of the thecal sac. Otherwise no impingement. Upper chest: Unremarkable where included. Other: No supplemental non-categorized findings. IMPRESSION: 1. No acute cervical spine findings. 2. Small central disc protrusion with associated spurring at C4-5 causing borderline central narrowing  of the thecal sac. 3. Acute subdural hematoma along the right middle cranial fossa. This will be exploration in further detail in the head CT report. Electronically Signed   By: Gaylyn Rong M.D.   On: 06/19/2023 10:12   CT Head Wo Contrast Result Date: 06/19/2023 CLINICAL DATA:  Head trauma with abnormal mental status EXAM: CT HEAD WITHOUT CONTRAST TECHNIQUE: Contiguous axial images were obtained from the base of the skull through the vertex without intravenous contrast. RADIATION DOSE REDUCTION: This exam was performed according to the departmental dose-optimization program which includes automated exposure control, adjustment of the mA and/or kV according to patient size and/or use of iterative reconstruction technique. COMPARISON:  05/27/2023 FINDINGS: Brain: Acute subdural hematoma along the right cerebral convexity measuring up to 2.2 cm in the parietal region with mild tentorial and inter hemispheric component. Very heterogeneous hematoma with low-density areas suggesting recent/un clotted blood. Midline shift measures 9 mm. Chronic infarcts along the right cerebral convexity and a roughly watershed distribution on prior. No entrapment. Vascular: No hyperdense vessel or unexpected calcification. Skull: Normal. Negative for fracture or focal lesion. Sinuses/Orbits: No acute finding. Other: Critical Value/emergent results were called by telephone at the time of interpretation on  06/19/2023 at 10:04 am to provider [facility] ZAMMIT , who verbally acknowledged these results. IMPRESSION: 1. Acute subdural hematoma on the right measuring up to 2.1 cm in thickness with 9 mm of midline shift. 2. Chronic infarcts most extensive in the right MCA and watershed distribution. Electronically Signed   By: Tiburcio Pea M.D.   On: 06/19/2023 10:11    Review of Systems Blood pressure 99/61, pulse 87, temperature 97.9 F (36.6 C), temperature source Oral, resp. rate 17, height 5\' 5"  (1.651 m), weight 99.8 kg, last menstrual period 05/25/2023, SpO2 98%. Physical Exam Constitutional:      Appearance: Normal appearance.  HENT:     Head: Normocephalic and atraumatic.     Nose: Nose normal.     Mouth/Throat:     Mouth: Mucous membranes are moist.     Pharynx: Oropharynx is clear.  Pulmonary:     Effort: Pulmonary effort is normal.     Breath sounds: Normal breath sounds.  Abdominal:     General: Abdomen is flat.     Palpations: Abdomen is soft.  Neurological:     Mental Status: She is alert.     Comments: Patient readily arouses to voice answers questions moves right upper right lower extremity well however she does move her left lower extremity weakly but she will not move her left upper extremity volitionally she admits though that this has been unchanged since she had a stroke sometime ago.  Her pupils are 4 mm on the left 3 mm on the right both sluggishly reactive.  Extraocular movements are full and the face is symmetric to grimace.  Speech is slow but methodical.  Psychiatric:        Mood and Affect: Mood normal.        Behavior: Behavior normal.        Thought Content: Thought content normal.        Judgment: Judgment normal.     Assessment/Plan: The patient is to be admitted to critical care service here at [facility] for further monitoring.  Dr. Estell Harpin will initiate reversal of Coumadin anticoagulation.  Follow-up CT scanning and evaluation clinically when the  patient arrives here at [facility].  Shary Key Taelynn Mcelhannon 06/19/2023, 10:33 AM

## 2023-06-19 NOTE — ED Triage Notes (Signed)
Patient BIB [facility], Called out for for a fall and hit her head on the floor. Denise Loss of consciousness. Patient take coumadin, hx of stroke with Left sided weakness.

## 2023-06-19 NOTE — Progress Notes (Signed)
PCCM progress note  Notified by nursing of change in mentation with associated emesis x 3.  Patient is more lethargic and confused with repetitive speech. Will repeat STAT noncontrasted CT head now.   Additionally patient is hyperglycemic with CBG 437 will administer 15 units of subcu acute insulin currently with plans to repeat CBG in 2 hours.  If patient remains hyperglycemic will start insulin drip  Also able to reach patient daughter over the phone to discuss clinical update and review CODE STATUS.  Daughter confirms that patient is a full code but DO NOT INTUBATE.   Aitanna Haubner D. Harris, NP-C [facility] Personal contact information can be found on Amion  If no contact or response made please call [phone] 06/19/2023, 3:45 PM

## 2023-06-19 NOTE — ED Notes (Signed)
[facility] here to transport patient to [facility] 

## 2023-06-19 NOTE — ED Notes (Signed)
 Patient transported to CT

## 2023-06-19 NOTE — H&P (Addendum)
NAME:  Lauren [name], MRN:  [id number], DOB:  12-10-1980, LOS: 0 ADMISSION DATE:  06/19/2023, CONSULTATION DATE:  06/19/23 REFERRING MD:  Dr. Estell Harpin - EDP, CHIEF COMPLAINT:  [facility]   History of Present Illness:  Lauren [name] is a 43 year old female with a past medical history significant for CAD with prior MI, chronically anticoagulated with Coumadin CHF, CKD stage IV, diabetes, prior stroke 2023 and anemia who presented to the ED at [facility] 2/16 via EMS after suffering mechanical fall at home with head injury.  On ED arrival patient was seen with mild hypotension but hemodynamically stable.  Lab work significant for NA 134, glucose 237, creatinine 2.37, GFR 26, albumin 2.7, WBC 19.0, INR supratherapeutic at 7.8.  Given fall with concern of head injury head CT was obtained on arrival and revealed right acute subdural hematoma measuring 2.1 with 9 mm midline shift.  Neurosurgery consulted with recommendations made to transfer to [facility] for further care.  PCCM consulted for further management admission  Pertinent  Medical History  CAD with prior MI, chronically anticoagulated with Coumadin CHF, CKD stage IV, diabetes, prior stroke 2023 and anemia   Significant Hospital Events: Including procedures, antibiotic start and stop dates in addition to other pertinent events   2/16 presented to ED via EMS after suffering a fall at home with head injury.  Head CT with acute SDH with midline shift in the setting of supratherapeutic INR  Interim History / Subjective:  As above  Objective   Blood pressure 99/61, pulse 87, temperature 97.9 F (36.6 C), temperature source Oral, resp. rate 17, height 5\' 5"  (1.651 m), weight 99.8 kg, last menstrual period 05/25/2023, SpO2 98%.       No intake or output data in the 24 hours ending 06/19/23 1057 Filed Weights   06/19/23 0808  Weight: 99.8 kg    Examination: General: Acute on chronically ill appearing deconditioned adult female lying in bed, in  NAD HEENT: [place]/AT, MM pink/moist, PERRL,  Neuro: Drowsy but easily arousable, right pupil 4 and sluggish, left 4 and brisk, left upper and lower weakness with left facial droop CV: s1s2 regular rate and rhythm, no murmur, rubs, or gallops,  PULM:  Clear to auscultation, no increased work of breathing, no added breath sounds  GI: soft, bowel sounds active in all 4 quadrants, non-tender, non-distended Extremities: warm/dry, no edema  Skin: no rashes or lesions   Resolved Hospital Problem list     Assessment & Plan:  Acute subdural hematoma with midline shift and brain compression -Per neurosurgery evaluation of head CT imaging: CT demonstrated acute right mixed density subdural hematoma with several loculations suggesting some chronic features P: Primary management per neurosurgery Coumadin reversed in ED Frequent neurochecks Repeat imaging per neurosurgery Trend INR  History of prior CVA -Chronic infarcts most extensive in the right MCA and watershed distribution seen on admission head CT P: Supportive care as above Secondary stroke prevention  HFrEF -Most recent echocardiogram December 2024 with EF 30 to 35%, global hypokinesis, normal RV function -Medication reconciliation pending but home meds appear to include medication includes Lipitor, Plavix, Lasix, hydralazine, Imdur, Toprol-XL, and Coumadin History of CAD with prior MI P: Continuous telemetry  Hold home Coumadin Strict intake and output  Daily weight to assess volume status Daily assessment for need to diurese  GDMT as able  CKD stage IV -Creatinine on admission 2.37 with GFR 26 compared to creatinine 2.26 with GFR 27 06/08/2023 P: Follow renal function  Monitor urine output  Trend Bmet Avoid nephrotoxins Ensure adequate renal perfusion   Type 2 diabetes P: SSI  CBG goal 140-180 CBG check q4  Hyponatremia P: Trend Veterinary surgeon (right click and "Control and instrumentation engineer" daily)    Diet/type: NPO DVT prophylaxis SCD Pressure ulcer(s): N/A GI prophylaxis: PPI Lines: N/A Foley:  N/A Code Status:  full code, per chart review patient has been LCB in the past but on arrival patient is no capable of code discussion will maintain Full Code until able to speak to surrogate decision maker  Last date of multidisciplinary goals of care discussion: Pending   Labs   CBC: Recent Labs  Lab 06/19/23 1010  WBC 19.0*  NEUTROABS 16.9*  HGB 11.7*  HCT 36.9  MCV 89.8  PLT 389    Basic Metabolic Panel: Recent Labs  Lab 06/19/23 1010  NA 134*  K 3.7  CL 101  CO2 21*  GLUCOSE 237*  BUN 18  CREATININE 2.37*  CALCIUM 8.1*   GFR: Estimated Creatinine Clearance: 36.9 mL/min (A) (by C-G formula based on SCr of 2.37 mg/dL (H)). Recent Labs  Lab 06/19/23 1010  WBC 19.0*    Liver Function Tests: Recent Labs  Lab 06/19/23 1010  AST 17  ALT 11  ALKPHOS 139*  BILITOT 1.0  PROT 6.4*  ALBUMIN 2.7*   No results for input(s): "LIPASE", "AMYLASE" in the last 168 hours. No results for input(s): "AMMONIA" in the last 168 hours.  ABG    Component Value Date/Time   PHART 7.284 (L) 06/01/2023 1317   PCO2ART 39.7 06/01/2023 1317   PO2ART 139 (H) 06/01/2023 1317   HCO3 18.8 (L) 06/01/2023 1317   TCO2 20 (L) 06/01/2023 1317   ACIDBASEDEF 7.0 (H) 06/01/2023 1317   O2SAT 99 06/01/2023 1317     Coagulation Profile: Recent Labs  Lab 06/19/23 1010  INR 7.8*    Cardiac Enzymes: No results for input(s): "CKTOTAL", "CKMB", "CKMBINDEX", "TROPONINI" in the last 168 hours.  HbA1C: Hemoglobin A1C  Date/Time Value Ref Range Status  10/18/2022 09:26 AM 8.3 (A) 4.0 - 5.6 % Final   HbA1c POC (<> result, manual entry)  Date/Time Value Ref Range Status  09/29/2021 08:47 AM 9.6 4.0 - 5.6 % Final   Hgb A1c MFr Bld  Date/Time Value Ref Range Status  04/22/2023 11:21 AM 8.7 (H) 4.8 - 5.6 % Final    Comment:    (NOTE)         Prediabetes: 5.7 - 6.4         Diabetes:  >6.4         Glycemic control for adults with diabetes: <7.0   07/19/2022 02:59 PM 7.0 (H) 4.8 - 5.6 % Final    Comment:    (NOTE)         Prediabetes: 5.7 - 6.4         Diabetes: >6.4         Glycemic control for adults with diabetes: <7.0     CBG: Recent Labs  Lab 06/19/23 0921  GLUCAP 212*    Review of Systems:   Unable to assess   Past Medical History:  She,  has a past medical history of Acute ischemic stroke (HCC) (01/01/2020), Acute kidney injury superimposed on CKD (HCC) (01/01/2020), Acute right MCA stroke (HCC) (01/28/2022), Anemia, CAD (coronary artery disease), Cerebrovascular accident (CVA) (HCC) (12/31/2019), CHF (congestive heart failure) (HCC), CKD (chronic kidney disease) stage 4, GFR 15-29 ml/min (HCC), Diabetes mellitus without complication (HCC), Myocardial  infarction (HCC), Neuropathy, PONV (postoperative nausea and vomiting), PVD (peripheral vascular disease) (HCC), and Stroke (HCC) (01/2022).   Surgical History:   Past Surgical History:  Procedure Laterality Date   ABDOMINAL AORTOGRAM W/LOWER EXTREMITY N/A 06/10/2022   Procedure: ABDOMINAL AORTOGRAM W/LOWER EXTREMITY;  Surgeon: Cephus Shelling, MD;  Location: [facility] INVASIVE CV LAB;  Service: Cardiovascular;  Laterality: N/A;   ABDOMINAL AORTOGRAM W/LOWER EXTREMITY Right 05/26/2023   Procedure: ABDOMINAL AORTOGRAM W/LOWER EXTREMITY;  Surgeon: Cephus Shelling, MD;  Location: [facility] INVASIVE CV LAB;  Service: Cardiovascular;  Laterality: Right;   AMPUTATION Left 09/02/2021   Procedure: AMPUTATION RAY;  Surgeon: Edwin Cap, DPM;  Location: [facility] OR;  Service: Podiatry;  Laterality: Left;  sagittal saw, 3L bag saline & Pulse   BONE BIOPSY Right 05/25/2023   Procedure: BONE BIOPSY OF RIGHT GREAT TOE;  Surgeon: Pilar Plate, DPM;  Location: [facility] OR;  Service: Orthopedics/Podiatry;  Laterality: Right;  Great toe bone biopsy versus amputation   Cardiac catherization     CHOLECYSTECTOMY      FEMORAL-TIBIAL BYPASS GRAFT Right 06/01/2023   Procedure: RIGHT LEG BYPASS GRAFT SUPERFICIAL FEMORAL-ANTERIOR TIBIAL ARTERY WITH RIGHT NON-REVERSED GREATER SAPHENOUS VEIN;  Surgeon: Cephus Shelling, MD;  Location: [facility] OR;  Service: Vascular;  Laterality: Right;   I & D EXTREMITY Left 08/30/2021   Procedure: IRRIGATION AND DEBRIDEMENT WITH BONE BIOPSY;  Surgeon: Candelaria Stagers, DPM;  Location: [facility] OR;  Service: Podiatry;  Laterality: Left;   IR ANGIO VERTEBRAL SEL SUBCLAVIAN INNOMINATE UNI L MOD SED  01/18/2022   IR CT HEAD LTD  01/08/2022   IR INTRA CRAN STENT  01/08/2022   IR PERCUTANEOUS ART THROMBECTOMY/INFUSION INTRACRANIAL INC DIAG ANGIO  01/08/2022   IRRIGATION AND DEBRIDEMENT FOOT Left 09/04/2021   Procedure: IRRIGATION AND DEBRIDEMENT FOOT AND CLOSURE;  Surgeon: Edwin Cap, DPM;  Location: [facility] OR;  Service: Podiatry;  Laterality: Left;   LEFT HEART CATH AND CORONARY ANGIOGRAPHY N/A 07/26/2022   Procedure: LEFT HEART CATH AND CORONARY ANGIOGRAPHY;  Surgeon: [place], Peter M, MD;  Location: [facility] INVASIVE CV LAB;  Service: Cardiovascular;  Laterality: N/A;   METATARSAL OSTEOTOMY Left 07/21/2022   Procedure: LEFT FOOT EXCISION OF FIFTH METATARSAL WITH DEBRIDEMENT OF ULCER, POSSIBLE BONE BIOPSY OF THE FOURTH METATARSAL, POSSIBLE FOURTH RAY AMPUTATION OF LEFT TOEAND METATARSAL;  Surgeon: Vivi Barrack, DPM;  Location: [facility];  Service: Podiatry;  Laterality: Left;   PERIPHERAL VASCULAR BALLOON ANGIOPLASTY Left 06/10/2022   Procedure: PERIPHERAL VASCULAR BALLOON ANGIOPLASTY;  Surgeon: Cephus Shelling, MD;  [facility]: [facility];  Service: Cardiovascular;  Laterality: Left;  AT and DP   PERIPHERAL VASCULAR INTERVENTION Left 06/10/2022   Procedure: PERIPHERAL VASCULAR INTERVENTION;  Surgeon: Cephus Shelling, MD;  Location: [facility] INVASIVE CV LAB;  Service: Cardiovascular;  Laterality: Left;  SFA   RADIOLOGY WITH ANESTHESIA N/A 01/08/2022   Procedure: IR WITH ANESTHESIA;   Surgeon: Radiologist, Medication, MD;  Location: [facility];  Service: Radiology;  Laterality: N/A;   THROMBECTOMY BRACHIAL ARTERY Right 11/18/2021   Procedure: RIGHT BRACHIAL, RADIAL, & ULNAR ARTERY THROMBECTOMY.;  Surgeon: Cephus Shelling, MD;  Location: [facility];  Service: Vascular;  Laterality: Right;   TUBAL LIGATION     VEIN HARVEST Right 06/01/2023   Procedure: VEIN HARVEST OF RIGHT GREATER SAPHENOUS VEIN;  Surgeon: Cephus Shelling, MD;  Location: [facility] OR;  Service: Vascular;  Laterality: Right;     Social History:   reports that she has been smoking cigarettes.  She started smoking about 16 years ago. She has a 15 pack-year smoking history. She has been exposed to tobacco smoke. She has never used smokeless tobacco. She reports current alcohol use. She reports that she does not use drugs.   Family History:  Her family history includes Breast cancer (age of onset: 42) in her paternal grandmother; CAD in her father and mother; CVA in her mother; Cancer in her paternal grandmother; Hyperlipidemia in her father and mother; Hypertension in her father and mother; Thyroid disease in her mother.   Allergies Allergies  Allergen Reactions   Tape Other (See Comments)    Skin comes off with any adhesive   Visipaque [Iodixanol] Nausea And Vomiting    Patient had vagal response during procedure became hypertensive, and bradycardic. CO2 used during procedure prior to contrast being used, this may be cause as well. Recommended premedicating prior to contrast being used in the future.    Wellbutrin [Bupropion] Hives   Gabapentin     dizzy   Ciprofloxacin Hcl Hives and Rash    Hives/rash at injection site; 01/15/22 tolerated IV cipro     Home Medications  Prior to Admission medications   Medication Sig Start Date End Date Taking? Authorizing Provider  acetaminophen (TYLENOL) 325 MG tablet Take 1-2 tablets (325-650 mg total) by mouth every 4 (four) hours as needed for mild pain. Patient taking  differently: Take 650 mg by mouth as needed for mild pain (pain score 1-3). 02/18/22   Love, Evlyn Kanner, PA-C  atorvastatin (LIPITOR) 40 MG tablet TAKE 1 TABLET BY MOUTH ONCE DAILY. Patient not taking: Reported on 06/10/2023 01/25/23   Billie Lade, MD  atorvastatin (LIPITOR) 40 MG tablet Take 1 tablet (40 mg total) by mouth daily. Patient not taking: Reported on 06/10/2023 06/07/23   Baglia, Corrina, PA-C  clonazePAM (KLONOPIN) 1 MG disintegrating tablet Take 1 tablet (1 mg total) by mouth 2 (two) times daily as needed for seizure (For seizure lasting more than 2 minutes.). Patient not taking: Reported on 06/10/2023 05/16/23   Billie Lade, MD  clopidogrel (PLAVIX) 75 MG tablet Take 1 tablet (75 mg total) by mouth daily. 05/16/23   Billie Lade, MD  Continuous Glucose Sensor (DEXCOM G6 SENSOR) MISC CHANGE SENSOR EVERY 10 DAYS. 05/16/23   Dani Gobble, NP  Continuous Glucose Transmitter (DEXCOM G6 TRANSMITTER) MISC Change transmitter every 90 days as directed. 01/20/23   Roma Kayser, MD  furosemide (LASIX) 40 MG tablet TAKE 1 TABLET BY MOUTH ONCE DAILY AS NEEDED FOR SWELLING *PATIENT NEEDS APPOINTMENT* Patient taking differently: Take 40 mg by mouth daily as needed for edema. TAKE 1 TABLET BY MOUTH ONCE DAILY AS NEEDED FOR SWELLING *PATIENT NEEDS APPOINTMENT* 05/13/23   Antoine Poche, MD  hydrALAZINE (APRESOLINE) 25 MG tablet Take 1 tablet (25 mg total) by mouth in the morning and at bedtime. 04/24/23   Vassie Loll, MD  insulin aspart (NOVOLOG) 100 UNIT/ML injection Use with Omnipod for daily dose around 100 units daily Patient taking differently: Inject 0-200 Units into the skin See admin instructions. Load Omnipod with 200 units every 3-4 days. 10/18/22   Dani Gobble, NP  Insulin Disposable Pump (OMNIPOD 5 DEXG7G6 PODS GEN 5) MISC Change pod every 48-72 hours. 11/11/22   Dani Gobble, NP  isosorbide mononitrate (IMDUR) 60 MG 24 hr tablet TAKE TWO (2) TABLETS BY MOUTH  EVERY DAY *NEW PRESCRIPTION REQUEST* Patient taking differently: Take 60 mg by mouth daily. 05/12/23   Dixon,  Lucina Mellow, MD  levothyroxine (SYNTHROID) 125 MCG tablet Take 1 tablet (125 mcg total) by mouth daily at 6 (six) AM. 06/17/22 06/12/23  Dani Gobble, NP  metoprolol succinate (TOPROL-XL) 50 MG 24 hr tablet Take 1 tablet (50 mg total) by mouth daily. Take with or immediately following a meal. 05/03/23 08/01/23  Carollee Herter, DO  nitroGLYCERIN (NITROSTAT) 0.4 MG SL tablet Place 1 tablet (0.4 mg total) under the tongue every 5 (five) minutes as needed for chest pain. Patient not taking: Reported on 06/10/2023 05/16/23   Billie Lade, MD  oxyCODONE-acetaminophen (PERCOCET/ROXICET) 5-325 MG tablet Take 1 tablet by mouth every 6 (six) hours as needed for moderate pain (pain score 4-6). 06/07/23   Baglia, Corrina, PA-C  pantoprazole (PROTONIX) 40 MG tablet Take 1 tablet (40 mg total) by mouth daily. Courtesy fill/ pt to get established with a primary MD for further refills 05/16/23 11/12/23  Billie Lade, MD  ranolazine (RANEXA) 1000 MG SR tablet TAKE ONE (1) TABLET BY MOUTH TWICE DAILY *NEW PRESCRIPTION REQUEST* Patient not taking: Reported on 06/10/2023 05/12/23   Billie Lade, MD  senna-docusate (SENOKOT-S) 8.6-50 MG tablet Take 1-2 tablets by mouth daily as needed for mild constipation. Patient not taking: Reported on 06/10/2023 05/16/23   Billie Lade, MD  tiZANidine (ZANAFLEX) 4 MG tablet TAKE 2 TABLETS BY MOUTH ONCE DAILY AT BEDTIME Patient taking differently: Take 4 mg by mouth at bedtime. 05/16/23   Billie Lade, MD  topiramate (TOPAMAX) 50 MG tablet Take 1 tablet (50 mg total) by mouth 2 (two) times daily. 03/16/23   Shon Hale, MD  traZODone (DESYREL) 100 MG tablet TAKE 1 TABLET BY MOUTH AT BEDTIME *NEW PRESCRIPTION REQUEST* Patient taking differently: Take 100 mg by mouth at bedtime. 05/12/23   Billie Lade, MD  warfarin (COUMADIN) 5 MG tablet TAKE ONE TABLET BY MOUTH DAILY  EXCEPT 1/2 TABLET ON WEDNESDAYS AND SATURDAYS OR AS DIRECTED Patient taking differently: Take 2.5-5 mg by mouth daily. Take 5mg  (1 tablet) daily, except on Tuesday, Thursday, and Saturday's take 2.5mg  (1/2 tablet). 02/14/23   Antoine Poche, MD     Critical care time:   CRITICAL CARE Performed by: Avril Busser D. Harris   Total critical care time: 42 minutes  Critical care time was exclusive of separately billable procedures and treating other patients.  Critical care was necessary to treat or prevent imminent or life-threatening deterioration.  Critical care was time spent personally by me on the following activities: development of treatment plan with patient and/or surrogate as well as nursing, discussions with consultants, evaluation of patient's response to treatment, examination of patient, obtaining history from patient or surrogate, ordering and performing treatments and interventions, ordering and review of laboratory studies, ordering and review of radiographic studies, pulse oximetry and re-evaluation of patient's condition.  Lauren [name] D. Harris, NP-C [facility] Personal contact information can be found on Amion  If no contact or response made please call [phone] 06/19/2023, 1:37 PM

## 2023-06-19 NOTE — Progress Notes (Signed)
eLink Physician-Brief Progress Note Patient Name: Lauren [name] DOB: Dec 26, 1980 MRN: [id number]   Date of Service  06/19/2023  HPI/Events of Note  Asked to evaluate patient for restlessness and agitation.  43 year old female admitted after fall with acute subdural hematoma with brain compression and midline shift.  eICU Interventions  Patient's chart briefly reviewed.  Spoke by phone to patient's bedside nurse.  Video assessment of patient done.  She is currently resting peacefully with no signs of agitation.  Will continue to monitor with neurochecks through the night.     Intervention Category Minor Interventions: Agitation / anxiety - evaluation and management  Carilyn Goodpasture 06/19/2023, 7:54 PM --------------------------------------- Addendum: Patient awake and agitated.  Trying to climb out of bed. Video assessment of patient done.  Agitated and not directable. Roll belt ordered.  Precedex as well ordered since patient is extremely agitated. Bedside nurse present for video interaction with patient.  Aware of plan.

## 2023-06-19 NOTE — Progress Notes (Signed)
Patient ID: Lauren [name], female   DOB: 08/20/1980, 43 y.o.   MRN: [id number] Patient arrived at emergency department afternoon time.  I arrived at 37 seen and examined the patient.  She arouses to voice readily.  She will answer questions appropriately but she is not moving her left upper extremity volitionally.  There is tone in the left arm.  Left lower extremity does move to the command but weakly.  Right lower extremity right upper extremity moves readily.  Composer 4 mm on the left 3 mm on the right and both are reactive.  She is complaining of a headache.  Plan: Preserved clinical status repeat CT scan in the morning.

## 2023-06-20 ENCOUNTER — Inpatient Hospital Stay (HOSPITAL_COMMUNITY): Payer: 59 | Admitting: Anesthesiology

## 2023-06-20 ENCOUNTER — Other Ambulatory Visit: Payer: Self-pay

## 2023-06-20 ENCOUNTER — Encounter (HOSPITAL_COMMUNITY)
Admission: EM | Disposition: A | Discharge status: Hospice | Payer: Self-pay | Source: Home / Self Care | Attending: Internal Medicine

## 2023-06-20 ENCOUNTER — Encounter (HOSPITAL_COMMUNITY): Payer: Self-pay | Admitting: Internal Medicine

## 2023-06-20 ENCOUNTER — Other Ambulatory Visit: Payer: Self-pay | Admitting: Neurological Surgery

## 2023-06-20 DIAGNOSIS — S065XAA Traumatic subdural hemorrhage with loss of consciousness status unknown, initial encounter: Secondary | ICD-10-CM | POA: Diagnosis not present

## 2023-06-20 DIAGNOSIS — I5022 Chronic systolic (congestive) heart failure: Secondary | ICD-10-CM

## 2023-06-20 DIAGNOSIS — N184 Chronic kidney disease, stage 4 (severe): Secondary | ICD-10-CM | POA: Diagnosis not present

## 2023-06-20 DIAGNOSIS — E109 Type 1 diabetes mellitus without complications: Secondary | ICD-10-CM

## 2023-06-20 DIAGNOSIS — I6201 Nontraumatic acute subdural hemorrhage: Secondary | ICD-10-CM | POA: Diagnosis not present

## 2023-06-20 DIAGNOSIS — I130 Hypertensive heart and chronic kidney disease with heart failure and stage 1 through stage 4 chronic kidney disease, or unspecified chronic kidney disease: Secondary | ICD-10-CM

## 2023-06-20 DIAGNOSIS — I5042 Chronic combined systolic (congestive) and diastolic (congestive) heart failure: Secondary | ICD-10-CM

## 2023-06-20 DIAGNOSIS — I2510 Atherosclerotic heart disease of native coronary artery without angina pectoris: Secondary | ICD-10-CM

## 2023-06-20 DIAGNOSIS — E039 Hypothyroidism, unspecified: Secondary | ICD-10-CM

## 2023-06-20 DIAGNOSIS — I739 Peripheral vascular disease, unspecified: Secondary | ICD-10-CM

## 2023-06-20 HISTORY — PX: CRANIOPLASTY: SHX1407

## 2023-06-20 LAB — CBC
HCT: 32.2 % — ABNORMAL LOW (ref 36.0–46.0)
Hemoglobin: 10 g/dL — ABNORMAL LOW (ref 12.0–15.0)
MCH: 28.1 pg (ref 26.0–34.0)
MCHC: 31.1 g/dL (ref 30.0–36.0)
MCV: 90.4 fL (ref 80.0–100.0)
Platelets: 328 10*3/uL (ref 150–400)
RBC: 3.56 MIL/uL — ABNORMAL LOW (ref 3.87–5.11)
RDW: 18.5 % — ABNORMAL HIGH (ref 11.5–15.5)
WBC: 18.3 10*3/uL — ABNORMAL HIGH (ref 4.0–10.5)
nRBC: 0 % (ref 0.0–0.2)

## 2023-06-20 LAB — BASIC METABOLIC PANEL
Anion gap: 19 — ABNORMAL HIGH (ref 5–15)
Anion gap: 11 (ref 5–15)
BUN: 22 mg/dL — ABNORMAL HIGH (ref 6–20)
BUN: 21 mg/dL — ABNORMAL HIGH (ref 6–20)
CO2: 17 mmol/L — ABNORMAL LOW (ref 22–32)
CO2: 22 mmol/L (ref 22–32)
Calcium: 8.6 mg/dL — ABNORMAL LOW (ref 8.9–10.3)
Calcium: 7.9 mg/dL — ABNORMAL LOW (ref 8.9–10.3)
Chloride: 102 mmol/L (ref 98–111)
Chloride: 107 mmol/L (ref 98–111)
Creatinine, Ser: 2.7 mg/dL — ABNORMAL HIGH (ref 0.44–1.00)
Creatinine, Ser: 2.27 mg/dL — ABNORMAL HIGH (ref 0.44–1.00)
GFR, Estimated: 22 mL/min — ABNORMAL LOW (ref >60)
GFR, Estimated: 27 mL/min — ABNORMAL LOW (ref >60)
Glucose, Bld: 246 mg/dL — ABNORMAL HIGH (ref 70–99)
Glucose, Bld: 85 mg/dL (ref 70–99)
Potassium: 4 mmol/L (ref 3.5–5.1)
Potassium: 3.6 mmol/L (ref 3.5–5.1)
Sodium: 138 mmol/L (ref 135–145)
Sodium: 140 mmol/L (ref 135–145)

## 2023-06-20 LAB — GLUCOSE, CAPILLARY
Glucose-Capillary: 117 mg/dL — ABNORMAL HIGH (ref 70–99)
Glucose-Capillary: 173 mg/dL — ABNORMAL HIGH (ref 70–99)
Glucose-Capillary: 198 mg/dL — ABNORMAL HIGH (ref 70–99)
Glucose-Capillary: 199 mg/dL — ABNORMAL HIGH (ref 70–99)
Glucose-Capillary: 231 mg/dL — ABNORMAL HIGH (ref 70–99)
Glucose-Capillary: 75 mg/dL (ref 70–99)
Glucose-Capillary: 84 mg/dL (ref 70–99)

## 2023-06-20 LAB — PROTIME-INR
INR: 1.3 — ABNORMAL HIGH (ref 0.8–1.2)
Prothrombin Time: 15.9 s — ABNORMAL HIGH (ref 11.4–15.2)

## 2023-06-20 LAB — BETA-HYDROXYBUTYRIC ACID
Beta-Hydroxybutyric Acid: 2.25 mmol/L — ABNORMAL HIGH (ref 0.05–0.27)
Beta-Hydroxybutyric Acid: 0.81 mmol/L — ABNORMAL HIGH (ref 0.05–0.27)

## 2023-06-20 LAB — AMMONIA: Ammonia: 17 umol/L (ref 9–35)

## 2023-06-20 LAB — PHOSPHORUS: Phosphorus: 3.1 mg/dL (ref 2.5–4.6)

## 2023-06-20 LAB — MAGNESIUM: Magnesium: 1.7 mg/dL (ref 1.7–2.4)

## 2023-06-20 LAB — TSH: TSH: 11.621 u[IU]/mL — ABNORMAL HIGH (ref 0.350–4.500)

## 2023-06-20 SURGERY — CRANIOPLASTY
Anesthesia: General | Site: Head | Laterality: Right

## 2023-06-20 MED ORDER — INSULIN ASPART 100 UNIT/ML IJ SOLN
0.0000 [IU] | INTRAMUSCULAR | Status: DC
  Administered 2023-06-20 – 2023-06-21 (×5): 4 [IU] via SUBCUTANEOUS
  Administered 2023-06-21 (×2): 7 [IU] via SUBCUTANEOUS
  Administered 2023-06-22 (×2): 3 [IU] via SUBCUTANEOUS

## 2023-06-20 MED ORDER — SODIUM CHLORIDE 0.9% IV SOLUTION: Freq: Once | INTRAVENOUS | Status: DC

## 2023-06-20 MED ORDER — ACETAMINOPHEN 10 MG/ML IV SOLN
INTRAVENOUS | Status: AC
  Filled 2023-06-20: qty 100

## 2023-06-20 MED ORDER — OXYCODONE HCL 5 MG PO TABS: 5.0000 mg | ORAL_TABLET | Freq: Once | ORAL | Status: DC | PRN

## 2023-06-20 MED ORDER — ONDANSETRON HCL 4 MG/2ML IJ SOLN
INTRAMUSCULAR | Status: DC | PRN
  Administered 2023-06-20: 4 mg via INTRAVENOUS

## 2023-06-20 MED ORDER — LIDOCAINE-EPINEPHRINE 1 %-1:100000 IJ SOLN
INTRAMUSCULAR | Status: DC | PRN
  Administered 2023-06-20: 5 mL via INTRADERMAL

## 2023-06-20 MED ORDER — LACTATED RINGERS IV BOLUS
500.0000 mL | Freq: Once | INTRAVENOUS | Status: AC
Start: 2023-06-20 — End: 2023-06-20
  Administered 2023-06-20: 500 mL via INTRAVENOUS

## 2023-06-20 MED ORDER — SUGAMMADEX SODIUM 200 MG/2ML IV SOLN
INTRAVENOUS | Status: DC | PRN
  Administered 2023-06-20: 200 mg via INTRAVENOUS

## 2023-06-20 MED ORDER — SODIUM CHLORIDE 0.9 % IV SOLN: INTRAVENOUS | Status: DC | PRN

## 2023-06-20 MED ORDER — LABETALOL HCL 5 MG/ML IV SOLN
10.0000 mg | INTRAVENOUS | Status: DC | PRN
  Administered 2023-06-20: 10 mg via INTRAVENOUS
  Administered 2023-06-21 (×3): 20 mg via INTRAVENOUS
  Administered 2023-06-23: 40 mg via INTRAVENOUS
  Filled 2023-06-20 (×2): qty 8
  Filled 2023-06-20: qty 4

## 2023-06-20 MED ORDER — PHENYLEPHRINE 80 MCG/ML (10ML) SYRINGE FOR IV PUSH (FOR BLOOD PRESSURE SUPPORT)
PREFILLED_SYRINGE | INTRAVENOUS | Status: AC
  Filled 2023-06-20: qty 20

## 2023-06-20 MED ORDER — DEXMEDETOMIDINE HCL IN NACL 80 MCG/20ML IV SOLN
INTRAVENOUS | Status: AC
  Filled 2023-06-20: qty 20

## 2023-06-20 MED ORDER — FENTANYL CITRATE (PF) 100 MCG/2ML IJ SOLN: 25.0000 ug | INTRAMUSCULAR | Status: DC | PRN

## 2023-06-20 MED ORDER — 0.9 % SODIUM CHLORIDE (POUR BTL) OPTIME
TOPICAL | Status: DC | PRN
  Administered 2023-06-20 (×2): 1000 mL

## 2023-06-20 MED ORDER — PHENYLEPHRINE HCL-NACL 20-0.9 MG/250ML-% IV SOLN
INTRAVENOUS | Status: DC | PRN
  Administered 2023-06-20: 30 ug/min via INTRAVENOUS

## 2023-06-20 MED ORDER — LIDOCAINE 2% (20 MG/ML) 5 ML SYRINGE
INTRAMUSCULAR | Status: DC | PRN
  Administered 2023-06-20: 60 mg via INTRAVENOUS

## 2023-06-20 MED ORDER — SODIUM CHLORIDE (PF) 0.9 % IJ SOLN
INTRAMUSCULAR | Status: AC
  Filled 2023-06-20: qty 20

## 2023-06-20 MED ORDER — LABETALOL HCL 5 MG/ML IV SOLN
10.0000 mg | INTRAVENOUS | Status: DC | PRN
  Administered 2023-06-21 – 2023-06-23 (×3): 20 mg via INTRAVENOUS
  Filled 2023-06-20 (×4): qty 4

## 2023-06-20 MED ORDER — ALBUMIN HUMAN 5 % IV SOLN: INTRAVENOUS | Status: DC | PRN

## 2023-06-20 MED ORDER — KETOROLAC TROMETHAMINE 30 MG/ML IJ SOLN
INTRAMUSCULAR | Status: AC
  Filled 2023-06-20: qty 1

## 2023-06-20 MED ORDER — INSULIN GLARGINE-YFGN 100 UNIT/ML ~~LOC~~ SOLN
10.0000 [IU] | Freq: Two times a day (BID) | SUBCUTANEOUS | Status: DC
  Administered 2023-06-20 – 2023-06-24 (×8): 10 [IU] via SUBCUTANEOUS
  Filled 2023-06-20 (×11): qty 0.1

## 2023-06-20 MED ORDER — POVIDONE-IODINE 10 % EX OINT
TOPICAL_OINTMENT | CUTANEOUS | Status: DC | PRN
  Administered 2023-06-20: 1 via TOPICAL

## 2023-06-20 MED ORDER — DEXMEDETOMIDINE HCL IN NACL 400 MCG/100ML IV SOLN
0.0000 ug/kg/h | INTRAVENOUS | Status: DC
  Administered 2023-06-20 – 2023-06-21 (×4): 0.4 ug/kg/h via INTRAVENOUS
  Filled 2023-06-20 (×3): qty 100

## 2023-06-20 MED ORDER — PANTOPRAZOLE SODIUM 40 MG IV SOLR
40.0000 mg | Freq: Every day | INTRAVENOUS | Status: DC
  Administered 2023-06-20: 40 mg via INTRAVENOUS
  Filled 2023-06-20: qty 10

## 2023-06-20 MED ORDER — BUPIVACAINE HCL (PF) 0.25 % IJ SOLN
INTRAMUSCULAR | Status: AC
  Filled 2023-06-20: qty 30

## 2023-06-20 MED ORDER — SENNA 8.6 MG PO TABS
1.0000 | ORAL_TABLET | Freq: Two times a day (BID) | ORAL | Status: DC
  Administered 2023-06-21 – 2023-06-30 (×18): 8.6 mg via ORAL
  Filled 2023-06-20 (×20): qty 1

## 2023-06-20 MED ORDER — SCOPOLAMINE 1 MG/3DAYS TD PT72
MEDICATED_PATCH | TRANSDERMAL | Status: AC
  Filled 2023-06-20: qty 1

## 2023-06-20 MED ORDER — PROPOFOL 10 MG/ML IV BOLUS
INTRAVENOUS | Status: AC
  Filled 2023-06-20: qty 20

## 2023-06-20 MED ORDER — LACTATED RINGERS IV SOLN: INTRAVENOUS | Status: DC | PRN

## 2023-06-20 MED ORDER — ACETAMINOPHEN 10 MG/ML IV SOLN
INTRAVENOUS | Status: DC | PRN
Start: 2023-06-20 — End: 2023-06-20
  Administered 2023-06-20: 1000 mg via INTRAVENOUS

## 2023-06-20 MED ORDER — DEXMEDETOMIDINE HCL IN NACL 80 MCG/20ML IV SOLN
INTRAVENOUS | Status: DC | PRN
Start: 2023-06-20 — End: 2023-06-20
  Administered 2023-06-20: 8 ug via INTRAVENOUS
  Administered 2023-06-20: 12 ug via INTRAVENOUS

## 2023-06-20 MED ORDER — ACETAMINOPHEN 160 MG/5ML PO SOLN: 325.0000 mg | ORAL | Status: DC | PRN

## 2023-06-20 MED ORDER — FENTANYL CITRATE (PF) 250 MCG/5ML IJ SOLN
INTRAMUSCULAR | Status: DC | PRN
Start: 2023-06-20 — End: 2023-06-20
  Administered 2023-06-20: 100 ug via INTRAVENOUS
  Administered 2023-06-20: 50 ug via INTRAVENOUS

## 2023-06-20 MED ORDER — TOPIRAMATE 25 MG PO TABS
50.0000 mg | ORAL_TABLET | Freq: Two times a day (BID) | ORAL | Status: DC
  Administered 2023-06-21 – 2023-06-30 (×20): 50 mg via ORAL
  Filled 2023-06-20 (×21): qty 2

## 2023-06-20 MED ORDER — FENTANYL CITRATE (PF) 250 MCG/5ML IJ SOLN
INTRAMUSCULAR | Status: AC
  Filled 2023-06-20: qty 5

## 2023-06-20 MED ORDER — VASOPRESSIN 20 UNIT/ML IV SOLN
INTRAVENOUS | Status: AC
  Filled 2023-06-20: qty 1

## 2023-06-20 MED ORDER — CEFAZOLIN SODIUM 1 G IJ SOLR
INTRAMUSCULAR | Status: AC
  Filled 2023-06-20: qty 20

## 2023-06-20 MED ORDER — PROPOFOL 10 MG/ML IV BOLUS
INTRAVENOUS | Status: DC | PRN
  Administered 2023-06-20: 30 mg via INTRAVENOUS
  Administered 2023-06-20: 140 mg via INTRAVENOUS

## 2023-06-20 MED ORDER — LIDOCAINE-EPINEPHRINE 1 %-1:100000 IJ SOLN
INTRAMUSCULAR | Status: AC
  Filled 2023-06-20: qty 1

## 2023-06-20 MED ORDER — ACETAMINOPHEN 325 MG PO TABS: 325.0000 mg | ORAL_TABLET | ORAL | Status: DC | PRN

## 2023-06-20 MED ORDER — THROMBIN 20000 UNITS EX SOLR: CUTANEOUS | Status: DC | PRN

## 2023-06-20 MED ORDER — DEXMEDETOMIDINE HCL IN NACL 400 MCG/100ML IV SOLN
INTRAVENOUS | Status: AC
  Filled 2023-06-20: qty 100

## 2023-06-20 MED ORDER — BUPIVACAINE HCL (PF) 0.25 % IJ SOLN
INTRAMUSCULAR | Status: DC | PRN
  Administered 2023-06-20: 5 mL

## 2023-06-20 MED ORDER — DEXAMETHASONE SODIUM PHOSPHATE 10 MG/ML IJ SOLN
INTRAMUSCULAR | Status: DC | PRN
  Administered 2023-06-20: 10 mg via INTRAVENOUS

## 2023-06-20 MED ORDER — ONDANSETRON HCL 4 MG/2ML IJ SOLN
INTRAMUSCULAR | Status: AC
  Filled 2023-06-20: qty 2

## 2023-06-20 MED ORDER — DEXAMETHASONE SODIUM PHOSPHATE 10 MG/ML IJ SOLN
INTRAMUSCULAR | Status: AC
  Filled 2023-06-20: qty 1

## 2023-06-20 MED ORDER — ROCURONIUM BROMIDE 10 MG/ML (PF) SYRINGE
PREFILLED_SYRINGE | INTRAVENOUS | Status: DC | PRN
  Administered 2023-06-20: 20 mg via INTRAVENOUS
  Administered 2023-06-20: 60 mg via INTRAVENOUS

## 2023-06-20 MED ORDER — POVIDONE-IODINE 10 % EX OINT
TOPICAL_OINTMENT | CUTANEOUS | Status: AC
  Filled 2023-06-20: qty 28.35

## 2023-06-20 MED ORDER — NOREPINEPHRINE 4 MG/250ML-% IV SOLN
INTRAVENOUS | Status: AC
  Filled 2023-06-20: qty 250

## 2023-06-20 MED ORDER — PHENYLEPHRINE 80 MCG/ML (10ML) SYRINGE FOR IV PUSH (FOR BLOOD PRESSURE SUPPORT)
PREFILLED_SYRINGE | INTRAVENOUS | Status: DC | PRN
  Administered 2023-06-20 (×2): 80 ug via INTRAVENOUS

## 2023-06-20 MED ORDER — FLEET ENEMA RE ENEM: 1.0000 | ENEMA | Freq: Once | RECTAL | Status: DC | PRN

## 2023-06-20 MED ORDER — OXYCODONE HCL 5 MG/5ML PO SOLN: 5.0000 mg | Freq: Once | ORAL | Status: DC | PRN

## 2023-06-20 MED ORDER — ROCURONIUM BROMIDE 10 MG/ML (PF) SYRINGE
PREFILLED_SYRINGE | INTRAVENOUS | Status: AC
  Filled 2023-06-20: qty 10

## 2023-06-20 MED ORDER — LEVOTHYROXINE SODIUM 25 MCG PO TABS
125.0000 ug | ORAL_TABLET | Freq: Every day | ORAL | Status: DC
  Administered 2023-06-21 – 2023-07-01 (×10): 125 ug via ORAL
  Filled 2023-06-20 (×11): qty 1

## 2023-06-20 MED ORDER — DOCUSATE SODIUM 100 MG PO CAPS
100.0000 mg | ORAL_CAPSULE | Freq: Two times a day (BID) | ORAL | Status: DC
  Administered 2023-06-21 – 2023-06-30 (×17): 100 mg via ORAL
  Filled 2023-06-20 (×18): qty 1

## 2023-06-20 MED ORDER — BISACODYL 10 MG RE SUPP: 10.0000 mg | Freq: Every day | RECTAL | Status: DC | PRN

## 2023-06-20 MED ORDER — MEPERIDINE HCL 25 MG/ML IJ SOLN: 6.2500 mg | INTRAMUSCULAR | Status: DC | PRN

## 2023-06-20 MED ORDER — SUGAMMADEX SODIUM 200 MG/2ML IV SOLN
INTRAVENOUS | Status: AC
  Filled 2023-06-20: qty 2

## 2023-06-20 MED ORDER — THROMBIN 20000 UNITS EX SOLR
CUTANEOUS | Status: AC
  Filled 2023-06-20: qty 20000

## 2023-06-20 MED ORDER — ATORVASTATIN CALCIUM 40 MG PO TABS
40.0000 mg | ORAL_TABLET | Freq: Every day | ORAL | Status: DC
  Administered 2023-06-21 – 2023-06-29 (×9): 40 mg via ORAL
  Filled 2023-06-20 (×9): qty 1

## 2023-06-20 MED ORDER — LIDOCAINE 2% (20 MG/ML) 5 ML SYRINGE
INTRAMUSCULAR | Status: AC
  Filled 2023-06-20: qty 5

## 2023-06-20 MED ORDER — EPHEDRINE 5 MG/ML INJ
INTRAVENOUS | Status: AC
  Filled 2023-06-20: qty 5

## 2023-06-20 MED ORDER — CEFAZOLIN SODIUM-DEXTROSE 2-3 GM-%(50ML) IV SOLR
INTRAVENOUS | Status: DC | PRN
Start: 2023-06-20 — End: 2023-06-20
  Administered 2023-06-20: 2 g via INTRAVENOUS

## 2023-06-20 SURGICAL SUPPLY — 65 items
BAG COUNTER SPONGE SURGICOUNT (BAG) ×2 IMPLANT
BASKET BONE COLLECTION (BASKET) IMPLANT
BIT DRILL WIRE PASS 1.3MM (BIT) IMPLANT
BNDG GAUZE DERMACEA FLUFF 4 (GAUZE/BANDAGES/DRESSINGS) ×2 IMPLANT
BNDG STRETCH 4X75 NS LF (GAUZE/BANDAGES/DRESSINGS) IMPLANT
BUR ACORN 6.0 (BURR) ×2 IMPLANT
BUR SPIRAL ROUTER 2.3 (BUR) IMPLANT
CABLE BIPOLOR RESECTION CORD (MISCELLANEOUS) ×2 IMPLANT
CANISTER SUCT 3000ML PPV (MISCELLANEOUS) ×2 IMPLANT
CLIP TI MEDIUM 6 (CLIP) IMPLANT
DRAIN CHANNEL 10M FLAT 3/4 FLT (DRAIN) IMPLANT
DRAIN PENROSE .5X12 LATEX STL (DRAIN) IMPLANT
DRAPE UTILITY XL STRL (DRAPES) IMPLANT
DRAPE WARM FLUID 44X44 (DRAPES) ×2 IMPLANT
DRILL WIRE PASS 1.3MM (BIT) IMPLANT
DRSG ADAPTIC 3X8 NADH LF (GAUZE/BANDAGES/DRESSINGS) IMPLANT
DURAPREP 6ML APPLICATOR 50/CS (WOUND CARE) ×2 IMPLANT
ELECT CAUTERY BLADE 6.4 (BLADE) ×2 IMPLANT
ELECT REM PT RETURN 9FT ADLT (ELECTROSURGICAL) ×1 IMPLANT
ELECTRODE REM PT RTRN 9FT ADLT (ELECTROSURGICAL) ×2 IMPLANT
EVACUATOR SILICONE 100CC (DRAIN) IMPLANT
GAUZE 4X4 16PLY ~~LOC~~+RFID DBL (SPONGE) IMPLANT
GAUZE PAD ABD 8X10 STRL (GAUZE/BANDAGES/DRESSINGS) IMPLANT
GAUZE SPONGE 4X4 12PLY STRL (GAUZE/BANDAGES/DRESSINGS) IMPLANT
GLOVE BIO SURGEON STRL SZ7 (GLOVE) IMPLANT
GLOVE BIOGEL PI IND STRL 8.5 (GLOVE) ×2 IMPLANT
GLOVE ECLIPSE 8.5 STRL (GLOVE) ×2 IMPLANT
GLOVE EXAM NITRILE XL STR (GLOVE) IMPLANT
GOWN STRL REUS W/ TWL LRG LVL3 (GOWN DISPOSABLE) IMPLANT
GOWN STRL REUS W/ TWL XL LVL3 (GOWN DISPOSABLE) ×2 IMPLANT
GOWN STRL REUS W/TWL 2XL LVL3 (GOWN DISPOSABLE) ×2 IMPLANT
HEMOSTAT SURGICEL 2X14 (HEMOSTASIS) IMPLANT
HOOK DURA (MISCELLANEOUS) ×2 IMPLANT
KIT BASIN OR (CUSTOM PROCEDURE TRAY) ×2 IMPLANT
KIT TURNOVER KIT B (KITS) ×2 IMPLANT
NDL HYPO 22X1.5 SAFETY MO (MISCELLANEOUS) ×2 IMPLANT
NEEDLE HYPO 22X1.5 SAFETY MO (MISCELLANEOUS) ×1 IMPLANT
NS IRRIG 1000ML POUR BTL (IV SOLUTION) ×2 IMPLANT
PACK BATTERY CMF DISP FOR DVR (ORTHOPEDIC DISPOSABLE SUPPLIES) IMPLANT
PACK CRANIOTOMY CUSTOM (CUSTOM PROCEDURE TRAY) ×2 IMPLANT
PATTIES SURGICAL .5 X.5 (GAUZE/BANDAGES/DRESSINGS) IMPLANT
PATTIES SURGICAL .5 X3 (DISPOSABLE) IMPLANT
PATTIES SURGICAL 1X1 (DISPOSABLE) IMPLANT
PIN MAYFIELD SKULL DISP (PIN) IMPLANT
PLATE CRANIAL 12 2H RIGID UNI (Plate) IMPLANT
SCREW UNIII AXS SD 1.5X4 (Screw) IMPLANT
SCREW UNIII AXS SD 1.5X5 (Screw) IMPLANT
SET CRAINOPLASTY (SET/KITS/TRAYS/PACK) ×2 IMPLANT
SPECIMEN JAR SMALL (MISCELLANEOUS) IMPLANT
SPIKE FLUID TRANSFER (MISCELLANEOUS) ×2 IMPLANT
SPONGE NEURO XRAY DETECT 1X3 (DISPOSABLE) IMPLANT
SPONGE SURGIFOAM ABS GEL 100 (HEMOSTASIS) IMPLANT
STAPLER VISISTAT (STAPLE) ×2 IMPLANT
SUT ETHILON 3 0 FSL (SUTURE) IMPLANT
SUT NURALON 4 0 TR CR/8 (SUTURE) ×4 IMPLANT
SUT VIC AB 2-0 CP2 18 (SUTURE) ×4 IMPLANT
SUT VIC AB 3-0 SH 8-18 (SUTURE) IMPLANT
SUT VIC AB 4-0 RB1 18 (SUTURE) ×2 IMPLANT
SYR CONTROL 10ML LL (SYRINGE) ×2 IMPLANT
TAPE SURG TRANSPORE 1 IN (GAUZE/BANDAGES/DRESSINGS) IMPLANT
TOWEL GREEN STERILE (TOWEL DISPOSABLE) ×2 IMPLANT
TOWEL GREEN STERILE FF (TOWEL DISPOSABLE) ×2 IMPLANT
TRAY FOLEY MTR SLVR 16FR STAT (SET/KITS/TRAYS/PACK) IMPLANT
UNDERPAD 30X36 HEAVY ABSORB (UNDERPADS AND DIAPERS) IMPLANT
WATER STERILE IRR 1000ML POUR (IV SOLUTION) ×2 IMPLANT

## 2023-06-20 NOTE — Transfer of Care (Signed)
Immediate Anesthesia Transfer of Care Note  Patient: Lauren [name]  Procedure(s) Performed: Right Fronto-Parietal Craniotomy for Subdural Hemorrhage (Right: Head)  Patient Location: PACU  Anesthesia Type:General  Level of Consciousness: awake  Airway & Oxygen Therapy: Patient Spontanous Breathing and Patient connected to face mask oxygen  Post-op Assessment: Report given to RN, Post -op Vital signs reviewed and stable, and Pt left side is stronger than right. Per pre-op baseline according to Dr. Danielle Dess.  Post vital signs: Reviewed and stable  Last Vitals:  Vitals Value Taken Time  BP 105/64 06/20/23 2045  Temp 36.7 C 06/20/23 2041  Pulse 89 06/20/23 2051  Resp 12 06/20/23 2051  SpO2 100 % 06/20/23 2051  Vitals shown include unfiled device data.  Last Pain:  Vitals:   06/20/23 1705  TempSrc: Axillary  PainSc: 0-No pain         Complications: No notable events documented.

## 2023-06-20 NOTE — Anesthesia Postprocedure Evaluation (Signed)
Anesthesia Post Note  Patient: Lauren [name]  Procedure(s) Performed: Right Fronto-Parietal Craniotomy for Subdural Hemorrhage (Right: Head)     Patient location during evaluation: PACU Anesthesia Type: General Level of consciousness: patient cooperative and confused Pain management: pain level controlled Vital Signs Assessment: post-procedure vital signs reviewed and stable Respiratory status: spontaneous breathing, nonlabored ventilation, respiratory function stable and patient connected to nasal cannula oxygen Cardiovascular status: blood pressure returned to baseline and stable Postop Assessment: no apparent nausea or vomiting Anesthetic complications: no   No notable events documented.  Last Vitals:  Vitals:   06/20/23 2100 06/20/23 2115  BP: 106/72 102/63  Pulse: 92 92  Resp: (!) 23 20  Temp:  36.7 C  SpO2: 100% 96%    Last Pain:  Vitals:   06/20/23 2115  TempSrc:   PainSc: Asleep                 Beryle Lathe

## 2023-06-20 NOTE — Progress Notes (Signed)
NAME:  Lauren [name], MRN:  [id number], DOB:  03/29/1981, LOS: 1 ADMISSION DATE:  06/19/2023, CONSULTATION DATE:  06/19/23 REFERRING MD:  Dr. Estell Harpin - EDP, CHIEF COMPLAINT:  [facility]   History of Present Illness:  Lauren [name] is a 43 year old female with a past medical history significant for CAD with prior MI, chronically anticoagulated with Coumadin CHF, CKD stage IV, diabetes, prior stroke 2023 and anemia who presented to the ED at [facility] 2/16 via EMS after suffering mechanical fall at home with head injury.  On ED arrival patient was seen with mild hypotension but hemodynamically stable.  Lab work significant for NA 134, glucose 237, creatinine 2.37, GFR 26, albumin 2.7, WBC 19.0, INR supratherapeutic at 7.8.  Given fall with concern of head injury head CT was obtained on arrival and revealed right acute subdural hematoma measuring 2.1 with 9 mm midline shift.  Neurosurgery consulted with recommendations made to transfer to [facility] for further care.  PCCM consulted for further management admission  Pertinent  Medical History  CAD with prior MI, chronically anticoagulated with Coumadin CHF, CKD stage IV, diabetes, prior stroke 2023 and anemia   Significant Hospital Events: Including procedures, antibiotic start and stop dates in addition to other pertinent events   2/16 presented to ED via EMS after suffering a fall at home with head injury.  Head CT with acute SDH with midline shift in the setting of supratherapeutic INR  Interim History / Subjective:  Having delirium overnight and trying to get out of bed. Posey belt placed and started on precedex  Objective   Blood pressure 127/79, pulse (!) 102, temperature 99.7 F (37.6 C), temperature source Axillary, resp. rate (!) 28, height 5\' 5"  (1.651 m), weight 75.4 kg, last menstrual period 05/25/2023, SpO2 94%.        Intake/Output Summary (Last 24 hours) at 06/20/2023 1021 Last data filed at 06/20/2023 0700 Gross per 24 hour  Intake  344.86 ml  Output 650 ml  Net -305.14 ml   Filed Weights   06/19/23 0808 06/20/23 0342  Weight: 99.8 kg 75.4 kg    Examination: General:  NAD HEENT: MM pink/moist Neuro: confused trying to get out of bed; able to follow some commands; stronger on right than left; perrl CV: s1s2, tachy 110s, no m/r/g PULM:  dim clear BS bilaterally GI: soft, bsx4 active  Extremities: warm/dry; ble pulses present  Skin: no rashes or lesions    Resolved Hospital Problem list     Assessment & Plan:  Acute subdural hematoma with midline shift and brain compression -Per neurosurgery evaluation of head CT imaging: CT demonstrated acute right mixed density subdural hematoma with several loculations suggesting some chronic features -coumadin reversed in ed Acute encephalopathy: delirium likely 2/2 to above Hx of seizure disorder History of prior CVA w/ left sided residual weakness P: -nsg following; appreciate recs -wean precedex; delirium precautions -cont to hold coumadin and plavix; scd for dvts ppx -frequent neuro checks -limit sedating meds -SBP goal <160 -keppra seizure ppx; resume home topamax -Continue neuroprotective measures- normothermia, euglycemia, HOB greater than 30, head in neutral alignment, normocapnia, normoxia.  -pt/ot/slp -statin when abel to take po; hold plavix and warfarin  Chronic HFrEF -Most recent echocardiogram December 2024 with EF 30 to 35%, global hypokinesis, normal RV function -Medication reconciliation pending but home meds appear to include medication includes Lipitor, Plavix, Lasix, hydralazine, Imdur, Toprol-XL, and Coumadin History of CAD with prior MI P: -hold plavix -daily weights; strict I/o's -prn labetalol -resume  home po meds when able to take po; family state no feeding tube per patient wishes; slp eval  Elevated creat on CKD stage IV -Creatinine on admission 2.37 with GFR 26 compared to creatinine 2.26 with GFR 27 06/08/2023 P: -Trend BMP /  urinary output -Replace electrolytes as indicated -Avoid nephrotoxic agents, ensure adequate renal perfusion  T1DM P: -increase ssi to resistant and cont cbg monitoring -add semglee  Hypothyroidism Plan: -resume synthroid -tsh  PAD/PVD s/p recent R SFA to ATA bypass on 1/30 Plan: -cont statin when able to take po -hold plavix and warfarin for now   Best Practice (right click and "Reselect all SmartList Selections" daily)   Diet/type: NPO; slp eval today; patient/family refuses feeding tube DVT prophylaxis SCD Pressure ulcer(s): N/A GI prophylaxis: PPI Lines: N/A Foley:  N/A Code Status:  full code, family states she is okay with compressions and shocks but no intubation or feeding tubes Last date of multidisciplinary goals of care discussion: 2/17 updated daughter and friend at bedside  Labs   CBC: Recent Labs  Lab 06/19/23 1010 06/20/23 0506  WBC 19.0* 18.3*  NEUTROABS 16.9*  --   HGB 11.7* 10.0*  HCT 36.9 32.2*  MCV 89.8 90.4  PLT 389 328    Basic Metabolic Panel: Recent Labs  Lab 06/19/23 1010 06/20/23 0506  NA 134* 138  K 3.7 4.0  CL 101 102  CO2 21* 17*  GLUCOSE 237* 246*  BUN 18 22*  CREATININE 2.37* 2.70*  CALCIUM 8.1* 8.6*  MG  --  1.7  PHOS  --  3.1   GFR: Estimated Creatinine Clearance: 28.2 mL/min (A) (by C-G formula based on SCr of 2.7 mg/dL (H)). Recent Labs  Lab 06/19/23 1010 06/20/23 0506  WBC 19.0* 18.3*    Liver Function Tests: Recent Labs  Lab 06/19/23 1010  AST 17  ALT 11  ALKPHOS 139*  BILITOT 1.0  PROT 6.4*  ALBUMIN 2.7*   No results for input(s): "LIPASE", "AMYLASE" in the last 168 hours. No results for input(s): "AMMONIA" in the last 168 hours.  ABG    Component Value Date/Time   PHART 7.284 (L) 06/01/2023 1317   PCO2ART 39.7 06/01/2023 1317   PO2ART 139 (H) 06/01/2023 1317   HCO3 18.8 (L) 06/01/2023 1317   TCO2 20 (L) 06/01/2023 1317   ACIDBASEDEF 7.0 (H) 06/01/2023 1317   O2SAT 99 06/01/2023 1317      Coagulation Profile: Recent Labs  Lab 06/19/23 1010 06/19/23 1410 06/20/23 0506  INR 7.8* 1.5* 1.3*    Cardiac Enzymes: No results for input(s): "CKTOTAL", "CKMB", "CKMBINDEX", "TROPONINI" in the last 168 hours.  HbA1C: Hemoglobin A1C  Date/Time Value Ref Range Status  10/18/2022 09:26 AM 8.3 (A) 4.0 - 5.6 % Final   HbA1c POC (<> result, manual entry)  Date/Time Value Ref Range Status  09/29/2021 08:47 AM 9.6 4.0 - 5.6 % Final   Hgb A1c MFr Bld  Date/Time Value Ref Range Status  04/22/2023 11:21 AM 8.7 (H) 4.8 - 5.6 % Final    Comment:    (NOTE)         Prediabetes: 5.7 - 6.4         Diabetes: >6.4         Glycemic control for adults with diabetes: <7.0   07/19/2022 02:59 PM 7.0 (H) 4.8 - 5.6 % Final    Comment:    (NOTE)         Prediabetes: 5.7 - 6.4  Diabetes: >6.4         Glycemic control for adults with diabetes: <7.0     CBG: Recent Labs  Lab 06/19/23 1535 06/19/23 1930 06/19/23 2335 06/20/23 0328 06/20/23 0727  GLUCAP 437* 378* 144* 231* 199*    Review of Systems:   Unable to assess   Past Medical History:  She,  has a past medical history of Acute ischemic stroke (HCC) (01/01/2020), Acute kidney injury superimposed on CKD (HCC) (01/01/2020), Acute right MCA stroke (HCC) (01/28/2022), Anemia, CAD (coronary artery disease), Cerebrovascular accident (CVA) (HCC) (12/31/2019), CHF (congestive heart failure) (HCC), CKD (chronic kidney disease) stage 4, GFR 15-29 ml/min (HCC), Diabetes mellitus without complication (HCC), Myocardial infarction (HCC), Neuropathy, PONV (postoperative nausea and vomiting), PVD (peripheral vascular disease) (HCC), and Stroke (HCC) (01/2022).   Surgical History:   Past Surgical History:  Procedure Laterality Date   ABDOMINAL AORTOGRAM W/LOWER EXTREMITY N/A 06/10/2022   Procedure: ABDOMINAL AORTOGRAM W/LOWER EXTREMITY;  Surgeon: Cephus Shelling, MD;  Location: [facility] INVASIVE CV LAB;  Service: Cardiovascular;   Laterality: N/A;   ABDOMINAL AORTOGRAM W/LOWER EXTREMITY Right 05/26/2023   Procedure: ABDOMINAL AORTOGRAM W/LOWER EXTREMITY;  Surgeon: Cephus Shelling, MD;  Location: [facility] CV [facility];  Service: Cardiovascular;  Laterality: Right;   AMPUTATION Left 09/02/2021   Procedure: AMPUTATION RAY;  Surgeon: Edwin Cap, DPM;  Location: [facility] OR;  Service: Podiatry;  Laterality: Left;  sagittal saw, 3L bag saline & Pulse   BONE BIOPSY Right 05/25/2023   Procedure: BONE BIOPSY OF RIGHT GREAT TOE;  Surgeon: Pilar Plate, DPM;  Location: [facility] OR;  Service: Orthopedics/Podiatry;  Laterality: Right;  Great toe bone biopsy versus amputation   Cardiac catherization     CHOLECYSTECTOMY     FEMORAL-TIBIAL BYPASS GRAFT Right 06/01/2023   Procedure: RIGHT LEG BYPASS GRAFT SUPERFICIAL FEMORAL-ANTERIOR TIBIAL ARTERY WITH RIGHT NON-REVERSED GREATER SAPHENOUS VEIN;  Surgeon: Cephus Shelling, MD;  Location: [facility] OR;  Service: Vascular;  Laterality: Right;   I & D EXTREMITY Left 08/30/2021   Procedure: IRRIGATION AND DEBRIDEMENT WITH BONE BIOPSY;  Surgeon: Candelaria Stagers, DPM;  Location: [facility] OR;  Service: Podiatry;  Laterality: Left;   IR ANGIO VERTEBRAL SEL SUBCLAVIAN INNOMINATE UNI L MOD SED  01/18/2022   IR CT HEAD LTD  01/08/2022   IR INTRA CRAN STENT  01/08/2022   IR PERCUTANEOUS ART THROMBECTOMY/INFUSION INTRACRANIAL INC DIAG ANGIO  01/08/2022   IRRIGATION AND DEBRIDEMENT FOOT Left 09/04/2021   Procedure: IRRIGATION AND DEBRIDEMENT FOOT AND CLOSURE;  Surgeon: Edwin Cap, DPM;  Location: [facility];  Service: Podiatry;  Laterality: Left;   LEFT HEART CATH AND CORONARY ANGIOGRAPHY N/A 07/26/2022   Procedure: LEFT HEART CATH AND CORONARY ANGIOGRAPHY;  Surgeon: [place], Peter M, MD;  Location: [facility]VAS[facility] CV LAB;  Service: Cardiovascular;  Laterality: N/A;   METATARSAL OSTEOTOMY Left 07/21/2022   Procedure: LEFT FOOT EXCISION OF FIFTH METATARSAL WITH DEBRIDEMENT OF ULCER, POSSIBLE BONE BIOPSY OF THE  FOURTH METATARSAL, POSSIBLE FOURTH RAY AMPUTATION OF LEFT TOEAND METATARSAL;  Surgeon: Vivi Barrack, DPM;  Location: [facility];  Service: Podiatry;  Laterality: Left;   PERIPHERAL VASCULAR BALLOON ANGIOPLASTY Left 06/10/2022   Procedure: PERIPHERAL VASCULAR BALLOON ANGIOPLASTY;  Surgeon: Cephus Shelling, MD;  Location: [facility];  Service: Cardiovascular;  Laterality: Left;  AT and DP   PERIPHERAL VASCULAR INTERVENTION Left 06/10/2022   Procedure: PERIPHERAL VASCULAR INTERVENTION;  Surgeon: Cephus Shelling, MD;  Location: [facility] INVASIVE CV LAB;  Service: Cardiovascular;  Laterality: Left;  SFA   RADIOLOGY WITH ANESTHESIA N/A 01/08/2022   Procedure: IR WITH ANESTHESIA;  Surgeon: Radiologist, Medication, MD;  Location: [facility];  Service: Radiology;  Laterality: N/A;   THROMBECTOMY BRACHIAL ARTERY Right 11/18/2021   Procedure: RIGHT BRACHIAL, RADIAL, & ULNAR ARTERY THROMBECTOMY.;  Surgeon: Cephus Shelling, MD;  Location: [facility];  Service: Vascular;  Laterality: Right;   TUBAL LIGATION     VEIN HARVEST Right 06/01/2023   Procedure: VEIN HARVEST OF RIGHT GREATER SAPHENOUS VEIN;  Surgeon: Cephus Shelling, MD;  Location: [facility] OR;  Service: Vascular;  Laterality: Right;     Social History:   reports that she has been smoking cigarettes. She started smoking about 16 years ago. She has a 15 pack-year smoking history. She has been exposed to tobacco smoke. She has never used smokeless tobacco. She reports current alcohol use. She reports that she does not use drugs.   Family History:  Her family history includes Breast cancer (age of onset: 89) in her paternal grandmother; CAD in her father and mother; CVA in her mother; Cancer in her paternal grandmother; Hyperlipidemia in her father and mother; Hypertension in her father and mother; Thyroid disease in her mother.   Allergies Allergies  Allergen Reactions   Tape Other (See Comments)    Skin comes off with any adhesive   Visipaque  [Iodixanol] Nausea And Vomiting    Patient had vagal response during procedure became hypertensive, and bradycardic. CO2 used during procedure prior to contrast being used, this may be cause as well. Recommended premedicating prior to contrast being used in the future.    Wellbutrin [Bupropion] Hives   Gabapentin     dizzy   Ciprofloxacin Hcl Hives and Rash    Hives/rash at injection site; 01/15/22 tolerated IV cipro     Home Medications  Prior to Admission medications   Medication Sig Start Date End Date Taking? Authorizing Provider  acetaminophen (TYLENOL) 325 MG tablet Take 1-2 tablets (325-650 mg total) by mouth every 4 (four) hours as needed for mild pain. Patient taking differently: Take 650 mg by mouth as needed for mild pain (pain score 1-3). 02/18/22   Love, Evlyn Kanner, PA-C  atorvastatin (LIPITOR) 40 MG tablet TAKE 1 TABLET BY MOUTH ONCE DAILY. Patient not taking: Reported on 06/10/2023 01/25/23   Billie Lade, MD  atorvastatin (LIPITOR) 40 MG tablet Take 1 tablet (40 mg total) by mouth daily. Patient not taking: Reported on 06/10/2023 06/07/23   Baglia, Corrina, PA-C  clonazePAM (KLONOPIN) 1 MG disintegrating tablet Take 1 tablet (1 mg total) by mouth 2 (two) times daily as needed for seizure (For seizure lasting more than 2 minutes.). Patient not taking: Reported on 06/10/2023 05/16/23   Billie Lade, MD  clopidogrel (PLAVIX) 75 MG tablet Take 1 tablet (75 mg total) by mouth daily. 05/16/23   Billie Lade, MD  Continuous Glucose Sensor (DEXCOM G6 SENSOR) MISC CHANGE SENSOR EVERY 10 DAYS. 05/16/23   Dani Gobble, NP  Continuous Glucose Transmitter (DEXCOM G6 TRANSMITTER) MISC Change transmitter every 90 days as directed. 01/20/23   Roma Kayser, MD  furosemide (LASIX) 40 MG tablet TAKE 1 TABLET BY MOUTH ONCE DAILY AS NEEDED FOR SWELLING *PATIENT NEEDS APPOINTMENT* Patient taking differently: Take 40 mg by mouth daily as needed for edema. TAKE 1 TABLET BY MOUTH ONCE DAILY  AS NEEDED FOR SWELLING *PATIENT NEEDS APPOINTMENT* 05/13/23   Antoine Poche, MD  hydrALAZINE (APRESOLINE) 25 MG tablet Take 1  tablet (25 mg total) by mouth in the morning and at bedtime. 04/24/23   Vassie Loll, MD  insulin aspart (NOVOLOG) 100 UNIT/ML injection Use with Omnipod for daily dose around 100 units daily Patient taking differently: Inject 0-200 Units into the skin See admin instructions. Load Omnipod with 200 units every 3-4 days. 10/18/22   Dani Gobble, NP  Insulin Disposable Pump (OMNIPOD 5 DEXG7G6 PODS GEN 5) MISC Change pod every 48-72 hours. 11/11/22   Dani Gobble, NP  isosorbide mononitrate (IMDUR) 60 MG 24 hr tablet TAKE TWO (2) TABLETS BY MOUTH EVERY DAY *NEW PRESCRIPTION REQUEST* Patient taking differently: Take 60 mg by mouth daily. 05/12/23   Billie Lade, MD  levothyroxine (SYNTHROID) 125 MCG tablet Take 1 tablet (125 mcg total) by mouth daily at 6 (six) AM. 06/17/22 06/12/23  Dani Gobble, NP  metoprolol succinate (TOPROL-XL) 50 MG 24 hr tablet Take 1 tablet (50 mg total) by mouth daily. Take with or immediately following a meal. 05/03/23 08/01/23  Carollee Herter, DO  nitroGLYCERIN (NITROSTAT) 0.4 MG SL tablet Place 1 tablet (0.4 mg total) under the tongue every 5 (five) minutes as needed for chest pain. Patient not taking: Reported on 06/10/2023 05/16/23   Billie Lade, MD  oxyCODONE-acetaminophen (PERCOCET/ROXICET) 5-325 MG tablet Take 1 tablet by mouth every 6 (six) hours as needed for moderate pain (pain score 4-6). 06/07/23   Baglia, Corrina, PA-C  pantoprazole (PROTONIX) 40 MG tablet Take 1 tablet (40 mg total) by mouth daily. Courtesy fill/ pt to get established with a primary MD for further refills 05/16/23 11/12/23  Billie Lade, MD  ranolazine (RANEXA) 1000 MG SR tablet TAKE ONE (1) TABLET BY MOUTH TWICE DAILY *NEW PRESCRIPTION REQUEST* Patient not taking: Reported on 06/10/2023 05/12/23   Billie Lade, MD  senna-docusate (SENOKOT-S) 8.6-50 MG  tablet Take 1-2 tablets by mouth daily as needed for mild constipation. Patient not taking: Reported on 06/10/2023 05/16/23   Billie Lade, MD  tiZANidine (ZANAFLEX) 4 MG tablet TAKE 2 TABLETS BY MOUTH ONCE DAILY AT BEDTIME Patient taking differently: Take 4 mg by mouth at bedtime. 05/16/23   Billie Lade, MD  topiramate (TOPAMAX) 50 MG tablet Take 1 tablet (50 mg total) by mouth 2 (two) times daily. 03/16/23   Shon Hale, MD  traZODone (DESYREL) 100 MG tablet TAKE 1 TABLET BY MOUTH AT BEDTIME *NEW PRESCRIPTION REQUEST* Patient taking differently: Take 100 mg by mouth at bedtime. 05/12/23   Billie Lade, MD  warfarin (COUMADIN) 5 MG tablet TAKE ONE TABLET BY MOUTH DAILY EXCEPT 1/2 TABLET ON WEDNESDAYS AND SATURDAYS OR AS DIRECTED Patient taking differently: Take 2.5-5 mg by mouth daily. Take 5mg  (1 tablet) daily, except on Tuesday, Thursday, and Saturday's take 2.5mg  (1/2 tablet). 02/14/23   Antoine Poche, MD     Critical care time: 40 minutes    JD Daryel [facility] 06/20/2023, 11:14 AM  Please see [email or web address] for pager details.  From 7A-7P if no response, please call [phone]. After hours, please call ELink [phone].

## 2023-06-20 NOTE — Progress Notes (Signed)
eLink Physician-Brief Progress Note Patient Name: Lizzette Carbonell DOB: August 09, 1980 MRN: [id number]   Date of Service  06/20/2023  HPI/Events of Note  Received request for foley cath.  Foley was placed while in PACU.  Pt is a 42/F s/p craniotomy.   eICU Interventions  Foley cath ordered.      Intervention Category Minor Interventions: Other:  Larinda Buttery 06/20/2023, 11:56 PM

## 2023-06-20 NOTE — Inpatient Diabetes Management (Addendum)
Inpatient Diabetes Program Recommendations  AACE/ADA: New Consensus Statement on Inpatient Glycemic Control (2015)  Target Ranges:  Prepandial:   less than 140 mg/dL      Peak postprandial:   less than 180 mg/dL (1-2 hours)      Critically ill patients:  140 - 180 mg/dL    Latest Reference Range & Units 04/22/23 11:21  Hemoglobin A1C 4.8 - 5.6 % 8.7 (H)  (H): Data is abnormally high  Latest Reference Range & Units 06/19/23 09:21 06/19/23 15:35 06/19/23 19:30 06/19/23 23:35 06/20/23 03:28 06/20/23 07:27  Glucose-Capillary 70 - 99 mg/dL 161 (H) 096 (H)  15 units Novolog  378 (H)  15 units Novolog  144 (H)  2 units Novolog  231 (H)  5 units Novolog  199 (H)  3 units Novolog   (H): Data is abnormally high   Admit with: Fall at home with head injury--Head CT with acute SDH with midline shift in the setting of supratherapeutic INR   History: Type 1 Diabetes, CKD  Home DM Meds: Dexcom G6 CGM       OmniPod 5 Insulin Pump          Current Orders: Novolog Resistant Correction Scale/ SSI (0-20 units) Q4 hours    MD- Note 5am BMET showed the CO2 was down to 17 and the Anion Gap was elevated to 19 Pt has Type 1 diabetes and does not have any basal insulin orders Do we need to check Beta-Hydroxybutyric acid level this AM? Pt will need basal insulin: Recommend Semglee 20 units daily to start (please give asap unless pt needs to start the IV Insulin Drip)    Addendum 11:30am--Talked w/ Lauren Lager, PA with CCM.  Reviewed 5am BMET with PA and concern pt may be going into DKA?  Asked for Beta-Hydroxybutyric acid level.  Asked for Semglee insulin as well.  PA to order Semglee 10 units BID and order Beta level.    ENDO: Lauren Bacon, NP with [facility] Last Seen in Office 10/18/2022 Insulin to Carb Ratio increased to 1:5 at that visit Spoke to [facility] by phone this AM (06/20/2023) Pt's Insulin pump basal rates change according to CGM readings,  however, original Basal setting was 0.9 units/hr for total of 21.6 units for 24 hour period Carb Ratio: 1:5 Correction Factor: 1:38 Target CBG 110 mg/dl  Active Insulin Time 3 hours    --Will follow patient during hospitalization--  Ambrose Finland RN, MSN, CDCES Diabetes Coordinator Inpatient Glycemic Control Team Team Pager: [phone] (8a-5p)

## 2023-06-20 NOTE — Anesthesia Preprocedure Evaluation (Addendum)
Anesthesia Evaluation  Patient identified by MRN, date of birth, ID band Patient confused    Reviewed: Allergy & Precautions, H&P , NPO status , Patient's Chart, lab work & pertinent test results  History of Anesthesia Complications (+) PONV and history of anesthetic complications  Airway Mallampati: II   Neck ROM: full    Dental   Pulmonary Current Smoker and Patient abstained from smoking.   breath sounds clear to auscultation       Cardiovascular hypertension, + CAD, + Past MI, + Peripheral Vascular Disease and +CHF   Rhythm:regular Rate:Normal  TTE (04/2023): EF 30-35%   Neuro/Psych Seizures -,  Subdural hematoma CVA, Residual Symptoms    GI/Hepatic ,GERD  ,,  Endo/Other  diabetes, Type 2Hypothyroidism    Renal/GU Renal InsufficiencyRenal disease     Musculoskeletal   Abdominal   Peds  Hematology   Anesthesia Other Findings   Reproductive/Obstetrics                             Anesthesia Physical Anesthesia Plan  ASA: 4  Anesthesia Plan: General   Post-op Pain Management:    Induction: Intravenous  PONV Risk Score and Plan: 3 and Ondansetron, Dexamethasone and Treatment may vary due to age or medical condition  Airway Management Planned: Oral ETT  Additional Equipment:   Intra-op Plan:   Post-operative Plan: Extubation in OR  Informed Consent: I have reviewed the patients History and Physical, chart, labs and discussed the procedure including the risks, benefits and alternatives for the proposed anesthesia with the patient or authorized representative who has indicated his/her understanding and acceptance.     Dental advisory given  Plan Discussed with: CRNA, Anesthesiologist and Surgeon  Anesthesia Plan Comments: (Patient with a history of heart failure, chronic kidney disease stage IV, suffered a mechanical fall with head injury-sustained a subdural hematoma with a 9  mm midline shift.  Had supratherapeutic INR)        Anesthesia Quick Evaluation

## 2023-06-20 NOTE — Progress Notes (Signed)
Patient ID: Lauren [name], female   DOB: July 12, 1980, 43 y.o.   MRN: [id number] [name] was examined today she is much more somnolent today she has had some small doses of Precedex and it appears that she is either somnolent or she is screaming out randomly when she is more awake and alert she withdraws her left upper extremity to pain but has not been moving it volitionally otherwise.  Her scan demonstrates that the large subdural hematomas coalescing.  Her PT is 1.3 today.  Ultimately I believe that she will require some surgical decompression however she has a limited code I have discussed this with [name] who notes that he would pretty much defer to the wishes of [name] regarding any major care decisions I will have a phone call with Lauren [name] to discuss consideration of surgical intervention.  In the meantime she does not want to be intubated and she is not requesting any feeding tubes per her living will.  I would hope that once these areas decompressed her level of consciousness could perhaps improve to allow her better function.

## 2023-06-20 NOTE — Progress Notes (Signed)
Pt arrived on precedex drip that was turned off on arrival by CRNA.

## 2023-06-20 NOTE — Progress Notes (Signed)
Pt became very restless and trying to get off the bed, CCM was paged and new orders for posey and precedex, orders implemented.

## 2023-06-20 NOTE — Anesthesia Procedure Notes (Addendum)
Arterial Line Insertion Start/End2/17/2025 6:12 PM, 06/20/2023 6:15 AM Performed by: Beryle Lathe, MD, attending  Patient location: OR. Preanesthetic checklist: patient identified, IV checked, risks and benefits discussed, surgical consent, monitors and equipment checked, pre-op evaluation, timeout performed and anesthesia consent Patient sedated Left, radial was placed Catheter size: 20 G Hand hygiene performed   Attempts: 1 Procedure performed using ultrasound guided technique. Ultrasound Notes:anatomy identified, needle tip was noted to be adjacent to the nerve/plexus identified, no ultrasound evidence of intravascular and/or intraneural injection and image(s) printed for medical record Following insertion, dressing applied and Biopatch. Post procedure assessment: unchanged and normal  Patient tolerated the procedure well with no immediate complications.

## 2023-06-20 NOTE — Anesthesia Procedure Notes (Signed)
Procedure Name: Intubation Date/Time: 06/20/2023 6:01 PM  Performed by: Orlin Hilding, CRNAPre-anesthesia Checklist: Patient identified, Emergency Drugs available, Suction available, Patient being monitored and Timeout performed Patient Re-evaluated:Patient Re-evaluated prior to induction Oxygen Delivery Method: Circle system utilized Preoxygenation: Pre-oxygenation with 100% oxygen Induction Type: IV induction Ventilation: Mask ventilation without difficulty Laryngoscope Size: Glidescope and 3 Grade View: Grade I Tube type: Oral Tube size: 7.0 mm Number of attempts: 1 Placement Confirmation: ETT inserted through vocal cords under direct vision, positive ETCO2 and breath sounds checked- equal and bilateral Secured at: 22 cm Tube secured with: Tape Dental Injury: Teeth and Oropharynx as per pre-operative assessment

## 2023-06-20 NOTE — Op Note (Signed)
Date of surgery: 06/20/2023 Preoperative diagnosis: Right frontal temporoparietal subdural hematoma acute Postoperative diagnosis: Same Procedure: Right frontotemporoparietal craniotomy, evacuation of subdural hematoma Surgeon: Barnett Abu Anesthesia: General endotracheal Indications: Lauren [name] is 43 year old individual who has been on Coumadin anticoagulation for severe vascular disease peripheral and central.  She sustained a minor fall where she developed severe headache and a CT scan demonstrates presence of a large subdural hematoma in the right frontal temporal parietal region her initial INR was noted to be 7.  She had rapid reversal.  She has had a progressive deterioration in her neurologic status has been advised she undergo craniotomy for evacuation of the hematoma.  Procedure: The patient was brought to the operating room supine on the stretcher.  After the smooth induction of general endotracheal anesthesia and placement of an arterial line and a Foley catheter the patient was placed in the 3 pin headrest after shaving the hair completely.  The head was turned to the left to expose the right side of the scalp in the frontal temporal and parietal regions.  The scalp was cleansed with alcohol and then painted with DuraPrep.  An incisional line was created anterior to the tragus of the ear to the vertex of the scalp in the parietal region.  Once draped out this area was infiltrated with 10 cc of lidocaine with epinephrine mixed 50-50 with half percent Marcaine.  A linear incision was made through the galea down to the temporalis fascia and bone.  Raney clips were used to secure the bleeding edges.  And then the scalp was retracted the temporalis muscle and fascia was opened the either side of the midline.  The exposed skull was then maintained under exposure with self-retaining retractors.  A parietal burr hole was created using high-speed drill and an acorn bit.  When the inner table of  the bone was traversed a 2 mm Kerrison punch was used to enlarge the hole and a craniotomy bit was then used to open a large oval craniotomy that expanded to the parietal region superiorly to the midline and anteriorly to the frontal and inferiorly to the temporal area.  This measured about 6 cm in maximum diameter.  The bone flap was laid aside.  The underlying dura was noted to be tense and dark blue.  The dura was opened in a cruciate fashion and retention sutures were placed on the edges.  A gelatinous hematoma was encountered and this was removed with irrigation and use of a large biopsy forcep to gradually free the hematoma from the underlying surface of the brain.  This was done in a piecemeal fashion being very careful to put minimum traction on any of the brain structures.  As the hematoma itself was relieved the area was irrigated copiously to identify any potential bleeding points.  None were encountered.  After thoroughly irrigating and removing all of the hard clot and inspecting for any potential bleeding sites when none were noted there was decided to close the dura with 4-0 Nurolon sutures in a running fashion.  The dura was tacked up to the perimetry of the craniotomy defect and a singular central tack up was created to attach this to the bone flap the bone flap was secured back in place with 3 dog bone plates.  Then the galea was reflected and closed with 2-0 and 3-0 Vicryl in an inverted interrupted fashion.  Surgical staples were used on the scalp.  Blood loss for the procedure was estimated at 100 cc.  A dry sterile dressing with a wrap was applied to the patient's scalp.  She was removed from the 3 pin headrest placed on the table in the supine position and anesthesia was going to extubate the patient prior to return to the ICU.

## 2023-06-21 DIAGNOSIS — S065XAA Traumatic subdural hemorrhage with loss of consciousness status unknown, initial encounter: Secondary | ICD-10-CM | POA: Diagnosis not present

## 2023-06-21 DIAGNOSIS — E109 Type 1 diabetes mellitus without complications: Secondary | ICD-10-CM | POA: Diagnosis not present

## 2023-06-21 DIAGNOSIS — N183 Chronic kidney disease, stage 3 unspecified: Secondary | ICD-10-CM

## 2023-06-21 DIAGNOSIS — I5022 Chronic systolic (congestive) heart failure: Secondary | ICD-10-CM | POA: Diagnosis not present

## 2023-06-21 LAB — GLUCOSE, CAPILLARY
Glucose-Capillary: 122 mg/dL — ABNORMAL HIGH (ref 70–99)
Glucose-Capillary: 179 mg/dL — ABNORMAL HIGH (ref 70–99)
Glucose-Capillary: 182 mg/dL — ABNORMAL HIGH (ref 70–99)
Glucose-Capillary: 187 mg/dL — ABNORMAL HIGH (ref 70–99)
Glucose-Capillary: 229 mg/dL — ABNORMAL HIGH (ref 70–99)
Glucose-Capillary: 248 mg/dL — ABNORMAL HIGH (ref 70–99)

## 2023-06-21 LAB — BASIC METABOLIC PANEL
Anion gap: 14 (ref 5–15)
BUN: 22 mg/dL — ABNORMAL HIGH (ref 6–20)
CO2: 19 mmol/L — ABNORMAL LOW (ref 22–32)
Calcium: 7.9 mg/dL — ABNORMAL LOW (ref 8.9–10.3)
Chloride: 107 mmol/L (ref 98–111)
Creatinine, Ser: 2.29 mg/dL — ABNORMAL HIGH (ref 0.44–1.00)
GFR, Estimated: 27 mL/min — ABNORMAL LOW (ref >60)
Glucose, Bld: 243 mg/dL — ABNORMAL HIGH (ref 70–99)
Potassium: 3.9 mmol/L (ref 3.5–5.1)
Sodium: 140 mmol/L (ref 135–145)

## 2023-06-21 LAB — CBC
HCT: 22.6 % — ABNORMAL LOW (ref 36.0–46.0)
Hemoglobin: 7.1 g/dL — ABNORMAL LOW (ref 12.0–15.0)
MCH: 28.4 pg (ref 26.0–34.0)
MCHC: 31.4 g/dL (ref 30.0–36.0)
MCV: 90.4 fL (ref 80.0–100.0)
Platelets: 265 10*3/uL (ref 150–400)
RBC: 2.5 MIL/uL — ABNORMAL LOW (ref 3.87–5.11)
RDW: 19 % — ABNORMAL HIGH (ref 11.5–15.5)
WBC: 14.2 10*3/uL — ABNORMAL HIGH (ref 4.0–10.5)
nRBC: 0 % (ref 0.0–0.2)

## 2023-06-21 LAB — HEMOGLOBIN AND HEMATOCRIT, BLOOD
HCT: 22.5 % — ABNORMAL LOW (ref 36.0–46.0)
Hemoglobin: 7.1 g/dL — ABNORMAL LOW (ref 12.0–15.0)

## 2023-06-21 LAB — BETA-HYDROXYBUTYRIC ACID: Beta-Hydroxybutyric Acid: 2.54 mmol/L — ABNORMAL HIGH (ref 0.05–0.27)

## 2023-06-21 LAB — PROTIME-INR
INR: 1.1 (ref 0.8–1.2)
Prothrombin Time: 14 s (ref 11.4–15.2)

## 2023-06-21 MED ORDER — DEXMEDETOMIDINE HCL IN NACL 400 MCG/100ML IV SOLN
0.0000 ug/kg/h | INTRAVENOUS | Status: DC
Start: 2023-06-21 — End: 2023-06-23
  Administered 2023-06-21: 0.4 ug/kg/h via INTRAVENOUS
  Administered 2023-06-22: 0.2 ug/kg/h via INTRAVENOUS
  Filled 2023-06-21: qty 100

## 2023-06-21 MED ORDER — LEVETIRACETAM 500 MG PO TABS
500.0000 mg | ORAL_TABLET | Freq: Two times a day (BID) | ORAL | Status: AC
  Administered 2023-06-21 – 2023-06-26 (×10): 500 mg via ORAL
  Filled 2023-06-21 (×10): qty 1

## 2023-06-21 MED ORDER — OXYCODONE HCL 5 MG PO TABS
10.0000 mg | ORAL_TABLET | ORAL | Status: DC | PRN
  Administered 2023-06-21 – 2023-07-01 (×31): 10 mg via ORAL
  Filled 2023-06-21 (×31): qty 2

## 2023-06-21 MED ORDER — PANTOPRAZOLE SODIUM 40 MG PO TBEC
40.0000 mg | DELAYED_RELEASE_TABLET | Freq: Every day | ORAL | Status: DC
  Administered 2023-06-21 – 2023-06-30 (×10): 40 mg via ORAL
  Filled 2023-06-21 (×10): qty 1

## 2023-06-21 MED ORDER — METOPROLOL SUCCINATE ER 25 MG PO TB24
25.0000 mg | ORAL_TABLET | Freq: Every day | ORAL | Status: DC
  Administered 2023-06-21 – 2023-06-28 (×8): 25 mg via ORAL
  Filled 2023-06-21 (×8): qty 1

## 2023-06-21 NOTE — Progress Notes (Signed)
eLink Physician-Brief Progress Note Patient Name: Lauren [name] DOB: [birth year] MRN: [id number]   Date of Service  06/21/2023  HPI/Events of Note  Notified of drop in hgb from 10 --> 7.1.  Pt is s/p SDH evacuation.  There are no signs of active bleeding.  Pt is hypertensive.   eICU Interventions  Recheck H&H at 10AM and transfuse to keep hgb >7.      Intervention Category Intermediate Interventions: Other:  Larinda Buttery 06/21/2023, 5:42 AM

## 2023-06-21 NOTE — Progress Notes (Signed)
Patient ID: Lauren [name], female   DOB: [birth year], 43 y.o.   MRN: [id number] Vital signs are stable.  Patient alerts easily.  She does follow some commands.  She is a bit irritable but moves the right side much better than she does her left.  Her left side moves to pain and withdrawal.  Dressing is clean and dry

## 2023-06-21 NOTE — Evaluation (Signed)
Occupational Therapy Evaluation Patient Details Name: Lauren [name] MRN: [id number] DOB: 01-26-1981 Today's Date: 06/21/2023   History of Present Illness   43 yo female s/p 2/17 R fronto parietal craniotomy for SDH PMh DM1, seizures, CVA, CKD stage 4, NSTEMI, recent left proximal humerus fx after fall 12/23. 1/14-1/25 L ama R LE SFA to AT bypass on 1/29     Clinical Impressions Patient is s/p R fronto parietal craniotomy surgery resulting in functional limitations due to the deficits listed below (see OT problem list). Pt requires 2 person (A) for basic transfer for safety. Pt demonstrates decreased cognition with poor safety awareness. Noted pt had recent L UE fx currently without sling, recent BIL LE bypass surgery and fall in the last 2 months.  Patient will benefit from skilled OT acutely to increase independence and safety with ADLS to allow discharge skilled inpatient follow up therapy, <3 hours/day. .      If plan is discharge home, recommend the following:   Two people to help with walking and/or transfers;Two people to help with bathing/dressing/bathroom     Functional Status Assessment   Patient has had a recent decline in their functional status and demonstrates the ability to make significant improvements in function in a reasonable and predictable amount of time.     Equipment Recommendations   None recommended by OT     Recommendations for Other Services         Precautions/Restrictions   Precautions Precautions: Fall Precaution/Restrictions Comments: L UE hemiparesis Restrictions LUE Weight Bearing Per Provider Order: Non weight bearing     Mobility Bed Mobility Overal bed mobility: Needs Assistance Bed Mobility: Supine to Sit, Sit to Supine     Supine to sit: Mod assist, HOB elevated Sit to supine: Mod assist   General bed mobility comments: pt needs 1 step cues and physical (A) of bil LE. pt at Eob able to sit CGA does report pain at head  and slight dizziness after standing    Transfers Overall transfer level: Needs assistance Equipment used: 1 person hand held assist Transfers: Sit to/from Stand Sit to Stand: Mod assist     Step pivot transfers: Mod assist, +2 safety/equipment     General transfer comment: pt requires support for steady for side stepping      Balance Overall balance assessment: Needs assistance   Sitting balance-Leahy Scale: Fair     Standing balance support: Single extremity supported, During functional activity, Reliant on assistive device for balance Standing balance-Leahy Scale: Poor                             ADL either performed or assessed with clinical judgement   ADL Overall ADL's : Needs assistance/impaired Eating/Feeding: Set up;Bed level Eating/Feeding Details (indicate cue type and reason): drinking from straw Grooming: Maximal assistance   Upper Body Bathing: Maximal assistance   Lower Body Bathing: Maximal assistance       Lower Body Dressing: Maximal assistance Lower Body Dressing Details (indicate cue type and reason): don socks               General ADL Comments: pt with bath provided by RN on arrival in bed and pt does not participate. pt reports inability to don socks or try     Vision Baseline Vision/History: 1 Wears glasses Ability to See in Adequate Light: 2 Moderately impaired Patient Visual Report: Blurring of vision;Peripheral vision impairment Vision Assessment?: Vision impaired- to  be further tested in functional context Additional Comments: pt does not have glasses. pt was unable to read clock at all and question cognition in addition to visual deficits. pt unable to move eyes laterally with verbal cue to see in L visual field. pt with object placed in R field of vision and then tracked to L side. pt then attending to thearpist on L . pt with L decr attention     Perception Perception: Impaired Preception Impairment Details:  Inattention/Neglect Perception-Other Comments: left   Praxis         Pertinent Vitals/Pain Pain Assessment Pain Assessment: Faces Faces Pain Scale: Hurts even more Pain Location: head Pain Descriptors / Indicators: Crying Pain Intervention(s): Monitored during session, Repositioned, Limited activity within patient's tolerance     Extremity/Trunk Assessment Upper Extremity Assessment Upper Extremity Assessment: Right hand dominant;LUE deficits/detail;RUE deficits/detail RUE Deficits / Details: noted to have wound on R shoulder that pt states is from the bandage / tag -- pt was trying to describe the sling for the L UE support. wound appears to be healing LUE Deficits / Details: noted to have deviation of 2nd-5th digits toward thumb, decreased digit extension denies wearing splint, poor hygiene noted at the thumb. LUE Sensation: decreased light touch;decreased proprioception LUE Coordination: decreased fine motor;decreased gross motor   Lower Extremity Assessment Lower Extremity Assessment: Defer to PT evaluation   Cervical / Trunk Assessment Cervical / Trunk Assessment: Normal   Communication Communication Communication: No apparent difficulties   Cognition Arousal: Alert Behavior During Therapy: Lability Cognition: No family/caregiver present to determine baseline             OT - Cognition Comments: pt able to correctly recall some event in this admission and at other times repeating information she hears staff state. pt very flat affect and crying suddenly without clear onset. pt liable. pt able ot be redirected and stops crying                 Following commands: Intact       Cueing  General Comments          Exercises     Shoulder Instructions      Home Living Family/patient expects to be discharged to:: Private residence Living Arrangements: Parent Available Help at Discharge: Family;Available 24 hours/day Type of Home: Apartment Home Access:  Level entry     Home Layout: One level     Bathroom Shower/Tub: Producer, television/film/video: Handicapped height Bathroom Accessibility: Yes How Accessible: Accessible via walker Home Equipment: BSC/3in1;Tub bench;Rolling Walker (2 wheels);Wheelchair - manual;Grab bars - tub/shower;Shower seat;Cane - single point   Additional Comments: information in the chart is from mothers handicap apartment. pt reports spouse has taken all this time off he can take off. that she does not know d/c location. information pulled from prior admission no famly present to verify  Lives With: Family    Prior Functioning/Environment Prior Level of Function : Needs assist  Cognitive Assist : Mobility (cognitive);ADLs (cognitive)     Physical Assist : ADLs (physical)   ADLs (physical): Dressing;Toileting;Bathing;IADLs Mobility Comments: Pt reports primarily using a manual wheelchair, propelling it with her legs and R arm while in and out of the home. ADLs Comments: pt states "do you understand someone does everything". prior chart reports daughter helps bath 2x per week, pt dressed herself /toilet needs and mother does Chief Technology Officer. Uncertain if she had more help since L UE injury or if pt was still able  to manage    OT Problem List: Decreased strength;Decreased range of motion;Decreased activity tolerance;Impaired balance (sitting and/or standing);Decreased coordination;Decreased safety awareness;Decreased knowledge of use of DME or AE;Decreased cognition;Impaired sensation;Impaired UE functional use;Pain;Impaired vision/perception;Cardiopulmonary status limiting activity;Impaired tone   OT Treatment/Interventions: Self-care/ADL training;Therapeutic exercise;Energy conservation;DME and/or AE instruction;Therapeutic activities;Cognitive remediation/compensation;Patient/family education;Balance training;Neuromuscular education;Splinting      OT Goals(Current goals can be found in the care plan  section)   Acute Rehab OT Goals Patient Stated Goal: to take a nap OT Goal Formulation: Patient unable to participate in goal setting Time For Goal Achievement: 07/12/23 Potential to Achieve Goals: Good   OT Frequency:       Co-evaluation PT/OT/SLP Co-Evaluation/Treatment: Yes Reason for Co-Treatment: Necessary to address cognition/behavior during functional activity;For patient/therapist safety;To address functional/ADL transfers   OT goals addressed during session: ADL's and self-care;Strengthening/ROM      AM-PAC OT "6 Clicks" Daily Activity     Outcome Measure Help from another person eating meals?: A Lot Help from another person taking care of personal grooming?: A Lot Help from another person toileting, which includes using toliet, bedpan, or urinal?: A Lot Help from another person bathing (including washing, rinsing, drying)?: A Lot Help from another person to put on and taking off regular upper body clothing?: A Lot Help from another person to put on and taking off regular lower body clothing?: A Lot 6 Click Score: 12   End of Session Nurse Communication: Mobility status;Precautions  Activity Tolerance: Patient tolerated treatment well Patient left: in bed;with call bell/phone within reach;with bed alarm set  OT Visit Diagnosis: Unsteadiness on feet (R26.81);Other abnormalities of gait and mobility (R26.89);Muscle weakness (generalized) (M62.81)                Time: 7829-5621 OT Time Calculation (min): 33 min Charges:  OT General Charges $OT Visit: 1 Visit OT Evaluation $OT Eval Moderate Complexity: 1 Mod   Brynn, OTR/L  Acute Rehabilitation Services Office: [phone] .   [name] 06/21/2023, 3:43 PM

## 2023-06-21 NOTE — TOC CM/SW Note (Signed)
Transition of Care [facility]) - Inpatient Brief Assessment   Patient Details  Name: Lauren [name] MRN: [id number] Date of Birth: 1980-08-15  Transition of Care [facility]) CM/SW Contact:    Mearl Latin, LCSW Phone Number: 06/21/2023, 5:41 PM   Clinical Narrative: Patient admitted from home with spouse. CSW received SNF consult. Will investigate SNF insurance benefits.    Transition of Care Asessment: Insurance and Status: Insurance coverage has been reviewed Patient has primary care physician: Yes Home environment has been reviewed: Lives with family/parents and spouse Prior level of function:: Needs assistance with ADLS Prior/Current Home Services: No current home services Social Drivers of Health Review: SDOH reviewed no interventions necessary Readmission risk has been reviewed: Yes Transition of care needs: transition of care needs identified, TOC will continue to follow

## 2023-06-21 NOTE — Progress Notes (Signed)
NAME:  Lauren [name], MRN:  [id number], DOB:  1980-07-11, LOS: 2 ADMISSION DATE:  06/19/2023, CONSULTATION DATE:  06/19/23 REFERRING MD:  Dr. Estell Harpin - EDP, CHIEF COMPLAINT:  [facility]   History of Present Illness:  Lauren [name] is a 43 year old female with a past medical history significant for CAD with prior MI, chronically anticoagulated with Coumadin CHF, CKD stage IV, diabetes, prior stroke 2023 and anemia who presented to the ED at [facility] 2/16 via EMS after suffering mechanical fall at home with head injury.  On ED arrival patient was seen with mild hypotension but hemodynamically stable.  Lab work significant for NA 134, glucose 237, creatinine 2.37, GFR 26, albumin 2.7, WBC 19.0, INR supratherapeutic at 7.8.  Given fall with concern of head injury head CT was obtained on arrival and revealed right acute subdural hematoma measuring 2.1 with 9 mm midline shift.  Neurosurgery consulted with recommendations made to transfer to [facility] for further care.  PCCM consulted for further management admission  Pertinent  Medical History  CAD with prior MI, chronically anticoagulated with Coumadin CHF, CKD stage IV, diabetes, prior stroke 2023 and anemia   Significant Hospital Events: Including procedures, antibiotic start and stop dates in addition to other pertinent events   2/16 presented to ED via EMS after suffering a fall at home with head injury.  Head CT with acute SDH with midline shift in the setting of supratherapeutic INR  Interim History / Subjective:  Having delirium overnight and trying to get out of bed. Posey belt placed and started on precedex  Objective   Blood pressure 130/83, pulse 91, temperature 98.9 F (37.2 C), temperature source Axillary, resp. rate 16, height 5\' 5"  (1.651 m), weight 77.2 kg, last menstrual period 05/25/2023, SpO2 98%.        Intake/Output Summary (Last 24 hours) at 06/21/2023 1026 Last data filed at 06/21/2023 0555 Gross per 24 hour  Intake 2389.44  ml  Output 610 ml  Net 1779.44 ml   Filed Weights   06/19/23 0808 06/20/23 0342 06/21/23 0425  Weight: 99.8 kg 75.4 kg 77.2 kg    Examination: General: Does not appear to be in distress HEENT: Moist oral mucosa Neuro: Follows commands  CV: S1-S2 appreciated PULM:  dim clear BS bilaterally GI: soft, bsx4 active  Extremities: warm/dry; ble pulses present  Skin: no rashes or lesions    Resolved Hospital Problem list     Assessment & Plan:   Acute subdural hematoma with midline shift and brain compression -Per neurosurgery evaluation of that CT imaging -Acute right mixed density subdural hematoma with several loculation suggesting some chronic features -Coumadin was reversed in the ED -Acute encephalopathy History of seizure disorder History of prior CVA with left-sided residual weakness -Appreciate neurosurgery recommendation -Delirium precautions -Neuroprotective measures- normothermia, euglycemia, HOB greater than 30, head in neutral alignment, normocapnia, normoxia -Limit sedating medications  Chronic heart failure with  reduced ejection fraction History of coronary artery disease with prior MI -Plavix on hold -As needed labetalol -Resume home medications when able to take p.o.  Chronic kidney disease stage III -Trend BMP -Replace electrolytes -Avoid nephrotoxic medications  Type 1 diabetes -Continue SSI  Hypothyroidism -Resume Synthroid  Peripheral arterial disease -Continue statins -Hold Plavix  Added Roxicodone for pain  Best Practice (right click and "Reselect all SmartList Selections" daily)   Diet/type: NPO; slp eval today; patient/family refuses feeding tube DVT prophylaxis SCD Pressure ulcer(s): N/A GI prophylaxis: PPI Lines: N/A Foley:  N/A Code Status:  full  code, family states she is okay with compressions and shocks but no intubation or feeding tubes Last date of multidisciplinary goals of care discussion: 2/17 updated daughter and  friend at bedside  Labs   CBC: Recent Labs  Lab 06/19/23 1010 06/20/23 0506 06/21/23 0330  WBC 19.0* 18.3* 14.2*  NEUTROABS 16.9*  --   --   HGB 11.7* 10.0* 7.1*  HCT 36.9 32.2* 22.6*  MCV 89.8 90.4 90.4  PLT 389 328 265    Basic Metabolic Panel: Recent Labs  Lab 06/19/23 1010 06/20/23 0506 06/20/23 2000 06/21/23 0330  NA 134* 138 140 140  K 3.7 4.0 3.6 3.9  CL 101 102 107 107  CO2 21* 17* 22 19*  GLUCOSE 237* 246* 85 243*  BUN 18 22* 21* 22*  CREATININE 2.37* 2.70* 2.27* 2.29*  CALCIUM 8.1* 8.6* 7.9* 7.9*  MG  --  1.7  --   --   PHOS  --  3.1  --   --    GFR: Estimated Creatinine Clearance: 33.6 mL/min (A) (by C-G formula based on SCr of 2.29 mg/dL (H)). Recent Labs  Lab 06/19/23 1010 06/20/23 0506 06/21/23 0330  WBC 19.0* 18.3* 14.2*    Liver Function Tests: Recent Labs  Lab 06/19/23 1010  AST 17  ALT 11  ALKPHOS 139*  BILITOT 1.0  PROT 6.4*  ALBUMIN 2.7*   No results for input(s): "LIPASE", "AMYLASE" in the last 168 hours. Recent Labs  Lab 06/20/23 1519  AMMONIA 17    ABG    Component Value Date/Time   PHART 7.284 (L) 06/01/2023 1317   PCO2ART 39.7 06/01/2023 1317   PO2ART 139 (H) 06/01/2023 1317   HCO3 18.8 (L) 06/01/2023 1317   TCO2 20 (L) 06/01/2023 1317   ACIDBASEDEF 7.0 (H) 06/01/2023 1317   O2SAT 99 06/01/2023 1317     Coagulation Profile: Recent Labs  Lab 06/19/23 1010 06/19/23 1410 06/20/23 0506 06/21/23 0330  INR 7.8* 1.5* 1.3* 1.1    Cardiac Enzymes: No results for input(s): "CKTOTAL", "CKMB", "CKMBINDEX", "TROPONINI" in the last 168 hours.  HbA1C: Hemoglobin A1C  Date/Time Value Ref Range Status  10/18/2022 09:26 AM 8.3 (A) 4.0 - 5.6 % Final   HbA1c POC (<> result, manual entry)  Date/Time Value Ref Range Status  09/29/2021 08:47 AM 9.6 4.0 - 5.6 % Final   Hgb A1c MFr Bld  Date/Time Value Ref Range Status  04/22/2023 11:21 AM 8.7 (H) 4.8 - 5.6 % Final    Comment:    (NOTE)         Prediabetes: 5.7 -  6.4         Diabetes: >6.4         Glycemic control for adults with diabetes: <7.0   07/19/2022 02:59 PM 7.0 (H) 4.8 - 5.6 % Final    Comment:    (NOTE)         Prediabetes: 5.7 - 6.4         Diabetes: >6.4         Glycemic control for adults with diabetes: <7.0     CBG: Recent Labs  Lab 06/20/23 2049 06/20/23 2138 06/20/23 2334 06/21/23 0333 06/21/23 0728  GLUCAP 75 84 117* 248* 179*    Review of Systems:   Unable to assess   Past Medical History:  She,  has a past medical history of Acute ischemic stroke (HCC) (01/01/2020), Acute kidney injury superimposed on CKD (HCC) (01/01/2020), Acute right MCA stroke (HCC) (01/28/2022), Anemia, CAD (coronary  artery disease), Cerebrovascular accident (CVA) (HCC) (12/31/2019), CHF (congestive heart failure) (HCC), CKD (chronic kidney disease) stage 4, GFR 15-29 ml/min (HCC), Diabetes mellitus without complication (HCC), Myocardial infarction (HCC), Neuropathy, PONV (postoperative nausea and vomiting), PVD (peripheral vascular disease) (HCC), and Stroke (HCC) (01/2022).   Surgical History:   Past Surgical History:  Procedure Laterality Date   ABDOMINAL AORTOGRAM W/LOWER EXTREMITY N/A 06/10/2022   Procedure: ABDOMINAL AORTOGRAM W/LOWER EXTREMITY;  Surgeon: Cephus Shelling, MD;  Location: [facility] INVASIVE CV LAB;  Service: Cardiovascular;  Laterality: N/A;   ABDOMINAL AORTOGRAM W/LOWER EXTREMITY Right 05/26/2023   Procedure: ABDOMINAL AORTOGRAM W/LOWER EXTREMITY;  Surgeon: Cephus Shelling, MD;  Location: [facility] INVASIVE CV LAB;  Service: Cardiovascular;  Laterality: Right;   AMPUTATION Left 09/02/2021   Procedure: AMPUTATION RAY;  Surgeon: Edwin Cap, DPM;  Location: [facility] OR;  Service: Podiatry;  Laterality: Left;  sagittal saw, 3L bag saline & Pulse   BONE BIOPSY Right 05/25/2023   Procedure: BONE BIOPSY OF RIGHT GREAT TOE;  Surgeon: Pilar Plate, DPM;  Location: [facility] OR;  Service: Orthopedics/Podiatry;  Laterality: Right;   Great toe bone biopsy versus amputation   Cardiac catherization     CHOLECYSTECTOMY     FEMORAL-TIBIAL BYPASS GRAFT Right 06/01/2023   Procedure: RIGHT LEG BYPASS GRAFT SUPERFICIAL FEMORAL-ANTERIOR TIBIAL ARTERY WITH RIGHT NON-REVERSED GREATER SAPHENOUS VEIN;  Surgeon: Cephus Shelling, MD;  Location: [facility] OR;  Service: Vascular;  Laterality: Right;   I & D EXTREMITY Left 08/30/2021   Procedure: IRRIGATION AND DEBRIDEMENT WITH BONE BIOPSY;  Surgeon: Candelaria Stagers, DPM;  Location: [facility] OR;  Service: Podiatry;  Laterality: Left;   IR ANGIO VERTEBRAL SEL SUBCLAVIAN INNOMINATE UNI L MOD SED  01/18/2022   IR CT HEAD LTD  01/08/2022   IR INTRA CRAN STENT  01/08/2022   IR PERCUTANEOUS ART THROMBECTOMY/INFUSION INTRACRANIAL INC DIAG ANGIO  01/08/2022   IRRIGATION AND DEBRIDEMENT FOOT Left 09/04/2021   Procedure: IRRIGATION AND DEBRIDEMENT FOOT AND CLOSURE;  Surgeon: Edwin Cap, DPM;  Location: [facility] OR;  Service: Podiatry;  Laterality: Left;   LEFT HEART CATH AND CORONARY ANGIOGRAPHY N/A 07/26/2022   Procedure: LEFT HEART CATH AND CORONARY ANGIOGRAPHY;  Surgeon: Swaziland, Peter M, MD;  Location: [facility] INVASIVE CV LAB;  Service: Cardiovascular;  Laterality: N/A;   METATARSAL OSTEOTOMY Left 07/21/2022   Procedure: LEFT FOOT EXCISION OF FIFTH METATARSAL WITH DEBRIDEMENT OF ULCER, POSSIBLE BONE BIOPSY OF THE FOURTH METATARSAL, POSSIBLE FOURTH RAY AMPUTATION OF LEFT TOEAND METATARSAL;  Surgeon: Vivi Barrack, DPM;  Location: [facility] OR;  Service: Podiatry;  Laterality: Left;   PERIPHERAL VASCULAR BALLOON ANGIOPLASTY Left 06/10/2022   Procedure: PERIPHERAL VASCULAR BALLOON ANGIOPLASTY;  Surgeon: Cephus Shelling, MD;  Location: [facility];  Service: Cardiovascular;  Laterality: Left;  AT and DP   PERIPHERAL VASCULAR INTERVENTION Left 06/10/2022   Procedure: PERIPHERAL VASCULAR INTERVENTION;  Surgeon: Cephus Shelling, MD;  Location: [facility] INVASIVE CV LAB;  Service: Cardiovascular;  Laterality: Left;   SFA   RADIOLOGY WITH ANESTHESIA N/A 01/08/2022   Procedure: IR WITH ANESTHESIA;  Surgeon: Radiologist, Medication, MD;  Location: [facility] OR;  Service: Radiology;  Laterality: N/A;   THROMBECTOMY BRACHIAL ARTERY Right 11/18/2021   Procedure: RIGHT BRACHIAL, RADIAL, & ULNAR ARTERY THROMBECTOMY.;  Surgeon: Cephus Shelling, MD;  Location: [facility] OR;  Service: Vascular;  Laterality: Right;   TUBAL LIGATION     VEIN HARVEST Right 06/01/2023   Procedure: VEIN HARVEST OF RIGHT GREATER SAPHENOUS VEIN;  Surgeon: Chestine Spore,  Canary Brim, MD;  Location: [facility] OR;  Service: Vascular;  Laterality: Right;     Social History:   reports that she has been smoking cigarettes. She started smoking about 16 years ago. She has a 15 pack-year smoking history. She has been exposed to tobacco smoke. She has never used smokeless tobacco. She reports current alcohol use. She reports that she does not use drugs.   Family History:  Her family history includes Breast cancer (age of onset: 33) in her paternal grandmother; CAD in her father and mother; CVA in her mother; Cancer in her paternal grandmother; Hyperlipidemia in her father and mother; Hypertension in her father and mother; Thyroid disease in her mother.   Allergies Allergies  Allergen Reactions   Tape Other (See Comments)    Skin comes off with any adhesive   Visipaque [Iodixanol] Nausea And Vomiting    Patient had vagal response during procedure became hypertensive, and bradycardic. CO2 used during procedure prior to contrast being used, this may be cause as well. Recommended premedicating prior to contrast being used in the future.    Wellbutrin [Bupropion] Hives   Gabapentin     dizzy   Ciprofloxacin Hcl Hives and Rash    Hives/rash at injection site; 01/15/22 tolerated IV cipro     The patient is critically ill with multiple organ systems failure and requires high complexity decision making for assessment and support, frequent evaluation and titration of  therapies, application of advanced monitoring technologies and extensive interpretation of multiple databases. Critical Care Time devoted to patient care services described in this note independent of APP/resident time (if applicable)  is 31 minutes.   Virl Diamond MD [facility] Personal pager: See Amion If unanswered, please page CCM On-call: #[phone]

## 2023-06-21 NOTE — Plan of Care (Signed)
  Problem: Clinical Measurements: Goal: Ability to maintain clinical measurements within normal limits will improve Outcome: Progressing Goal: Respiratory complications will improve Outcome: Progressing Goal: Cardiovascular complication will be avoided Outcome: Progressing   Problem: Coping: Goal: Level of anxiety will decrease Outcome: Progressing   Problem: Pain Managment: Goal: General experience of comfort will improve and/or be controlled Outcome: Progressing   Problem: Safety: Goal: Ability to remain free from injury will improve Outcome: Progressing

## 2023-06-21 NOTE — Evaluation (Signed)
Physical Therapy Evaluation Patient Details Name: Lauren [name] MRN: [id number] DOB: 1980/09/18 Today's Date: 06/21/2023  History of Present Illness  43 yo female s/p 2/17 R fronto parietal cranitomy for SDH PMh DM1, seizures, CVA, CKD stage 4, NSTEMI, recent left proximal humerus fx after fall 12/23. 1/14-1/25 L ama R LE SFA to AT bypass on 1/29  Clinical Impression   Lauren presents with generalized weakness with history of L hemiparesis, headache-type pain, impaired balance, emotional lability, and decreased activity tolerance. Lauren to benefit from acute Lauren to address deficits. Lauren stood EOB with mod +2 assist, Lauren crying in pain stating head hurt. Patient will benefit from continued inpatient follow up therapy, <3 hours/day. Lauren to progress mobility as tolerated, and will continue to follow acutely.          If plan is discharge home, recommend the following: Help with stairs or ramp for entrance;Assistance with cooking/housework;A lot of help with walking and/or transfers;Assist for transportation   Can travel by private vehicle        Equipment Recommendations Other (comment) (defer)  Recommendations for Other Services       Functional Status Assessment Patient has had a recent decline in their functional status and demonstrates the ability to make significant improvements in function in a reasonable and predictable amount of time.     Precautions / Restrictions Precautions Precautions: Fall Recall of Precautions/Restrictions: Impaired Precaution/Restrictions Comments: L UE hemiparesis Restrictions LUE Weight Bearing Per Provider Order: Non weight bearing      Mobility  Bed Mobility Overal bed mobility: Needs Assistance Bed Mobility: Supine to Sit, Sit to Supine     Supine to sit: Mod assist, HOB elevated Sit to supine: Mod assist   General bed mobility comments: Lauren needs 1 step cues and physical (A) of bil LE. Lauren at Eob able to sit CGA does report pain at head and slight  dizziness after standing    Transfers Overall transfer level: Needs assistance Equipment used: 1 person hand held assist Transfers: Sit to/from Stand Sit to Stand: Mod assist, +2 physical assistance   Step pivot transfers: Mod assist, +2 safety/equipment       General transfer comment: Lauren requires support for steady for side stepping    Ambulation/Gait               General Gait Details: unable to attempt  Stairs            Wheelchair Mobility     Tilt Bed    Modified Rankin (Stroke Patients Only)       Balance Overall balance assessment: Needs assistance Sitting-balance support: No upper extremity supported, Feet supported Sitting balance-Leahy Scale: Fair     Standing balance support: Single extremity supported, During functional activity, Reliant on assistive device for balance Standing balance-Leahy Scale: Poor Standing balance comment: reliant on mod +2                             Pertinent Vitals/Pain Pain Assessment Pain Assessment: Faces Faces Pain Scale: Hurts little more Pain Location: head Pain Descriptors / Indicators: Crying Pain Intervention(s): Monitored during session, Limited activity within patient's tolerance, Repositioned    Home Living Family/patient expects to be discharged to:: Private residence Living Arrangements: Parent Available Help at Discharge: Family;Available 24 hours/day Type of Home: Apartment Home Access: Level entry       Home Layout: One level Home Equipment: BSC/3in1;Tub bench;Rolling Walker (2 wheels);Wheelchair - manual;Grab bars -  tub/shower;Shower seat;Cane - single point Additional Comments: information in the chart is from mothers handicap apartment. Lauren reports spouse has taken all this time off he can take off. that she does not know d/c location. information pulled from prior admission no famly present to verify    Prior Function Prior Level of Function : Needs assist  Cognitive  Assist : Mobility (cognitive);ADLs (cognitive)     Physical Assist : ADLs (physical)   ADLs (physical): Dressing;Toileting;Bathing;IADLs Mobility Comments: Lauren reports primarily using a manual wheelchair, propelling it with her legs and R arm while in and out of the home. ADLs Comments: Lauren states "do you understand someone does everything". prior chart reports daughter helps bath 2x per week, Lauren dressed herself /toilet needs and mother does Chief Technology Officer. Uncertain if she had more help since L UE injury or if Lauren was still able to manage     Extremity/Trunk Assessment   Upper Extremity Assessment Upper Extremity Assessment: Defer to OT evaluation RUE Deficits / Details: R shoulder wound, Lauren states tags from her sling for LUE "ate my skin" LUE Deficits / Details: noted to have deviation of 2nd-5th digits toward thumb, decreased digit extension denies wearing splint, poor hygiene noted at the thumb. LUE Sensation: decreased light touch;decreased proprioception LUE Coordination: decreased fine motor;decreased gross motor    Lower Extremity Assessment Lower Extremity Assessment: Generalized weakness    Cervical / Trunk Assessment Cervical / Trunk Assessment: Normal  Communication   Communication Communication: No apparent difficulties    Cognition Arousal: Alert Behavior During Therapy: Lability   Lauren - Cognitive impairments: History of cognitive impairments, No family/caregiver present to determine baseline, Attention, Memory, Problem solving                       Lauren - Cognition Comments: repetitive story telling and when answering questions Following commands: Intact       Cueing Cueing Techniques: Verbal cues, Tactile cues     General Comments General comments (skin integrity, edema, etc.): bruising on back which Lauren reports is due to fall    Exercises     Assessment/Plan    Lauren Assessment Patient needs continued Lauren services  Lauren Problem List Decreased  activity tolerance;Decreased balance;Decreased mobility;Decreased cognition;Decreased knowledge of use of DME;Decreased safety awareness;Pain;Decreased strength;Decreased range of motion;Decreased knowledge of precautions       Lauren Treatment Interventions DME instruction;Functional mobility training;Gait training;Therapeutic activities;Balance training;Therapeutic exercise;Patient/family education;Wheelchair mobility training    Lauren Goals (Current goals can be found in the Care Plan section)  Acute Rehab Lauren Goals Lauren Goal Formulation: With patient Time For Goal Achievement: 07/05/23 Potential to Achieve Goals: Good    Frequency Min 1X/week     Co-evaluation Lauren/OT/SLP Co-Evaluation/Treatment: Yes Reason for Co-Treatment: Necessary to address cognition/behavior during functional activity;For patient/therapist safety;To address functional/ADL transfers Lauren goals addressed during session: Mobility/safety with mobility;Balance OT goals addressed during session: ADL's and self-care;Strengthening/ROM       AM-PAC Lauren "6 Clicks" Mobility  Outcome Measure Help needed turning from your back to your side while in a flat bed without using bedrails?: A Lot Help needed moving from lying on your back to sitting on the side of a flat bed without using bedrails?: A Lot Help needed moving to and from a bed to a chair (including a wheelchair)?: A Lot Help needed standing up from a chair using your arms (e.g., wheelchair or bedside chair)?: A Lot Help needed to walk in hospital room?: Total Help needed  climbing 3-5 steps with a railing? : Total 6 Click Score: 10    End of Session Equipment Utilized During Treatment: Gait belt Activity Tolerance: Treatment limited secondary to agitation Patient left: with call bell/phone within reach;in bed;with bed alarm set Nurse Communication: Mobility status Lauren Visit Diagnosis: Other abnormalities of gait and mobility (R26.89);Pain;Other symptoms and signs involving  the nervous system (R29.898) Pain - Right/Left: Right Pain - part of body: Leg    Time: 1430-1505 Lauren Time Calculation (min) (ACUTE ONLY): 35 min   Charges:   Lauren Evaluation $Lauren Eval Low Complexity: 1 Low   Lauren General Charges $$ ACUTE Lauren VISIT: 1 Visit         Lauren [name], Lauren [name] Acute Rehabilitation Services Secure Chat Preferred  Office [phone]   Lauren [name] Lauren [name] 06/21/2023, 4:44 PM

## 2023-06-21 NOTE — Evaluation (Signed)
Clinical/Bedside Swallow Evaluation Patient Details  Name: Lauren [name] MRN: [id number] Date of Birth: [birth year]  Today's Date: 06/21/2023 Time: SLP Start Time (ACUTE ONLY): 0904 SLP Stop Time (ACUTE ONLY): 4098 SLP Time Calculation (min) (ACUTE ONLY): 14 min  Past Medical History:  Past Medical History:  Diagnosis Date   Acute ischemic stroke (HCC) 01/01/2020   Acute kidney injury superimposed on CKD (HCC) 01/01/2020   Acute right MCA stroke (HCC) 01/28/2022   Anemia    CAD (coronary artery disease)    a. s/p cath in 03/2014 showing 30% mid-LAD, moderate to severe disease along small D1, patent LCx, moderate to severe distal OM2 stenosis and moderate diffuse diease along RCA not amenable to PCI   Cerebrovascular accident (CVA) (HCC) 12/31/2019   CHF (congestive heart failure) (HCC)    a. EF 55-60% in 12/2019 b. EF at 35-40% by echo in 05/2020   CKD (chronic kidney disease) stage 4, GFR 15-29 ml/min (HCC)    Diabetes mellitus without complication (HCC)    Myocardial infarction (HCC)    Neuropathy    PONV (postoperative nausea and vomiting)    PVD (peripheral vascular disease) (HCC)    Stroke (HCC) 01/2022   L hand weakness   Past Surgical History:  Past Surgical History:  Procedure Laterality Date   ABDOMINAL AORTOGRAM W/LOWER EXTREMITY N/A 06/10/2022   Procedure: ABDOMINAL AORTOGRAM W/LOWER EXTREMITY;  Surgeon: Cephus Shelling, MD;  [facility]: [facility];  Service: Cardiovascular;  Laterality: N/A;   ABDOMINAL AORTOGRAM W/LOWER EXTREMITY Right 05/26/2023   Procedure: ABDOMINAL AORTOGRAM W/LOWER EXTREMITY;  Surgeon: Cephus Shelling, MD;  Location: [facility];  Service: Cardiovascular;  Laterality: Right;   AMPUTATION Left 09/02/2021   Procedure: AMPUTATION RAY;  Surgeon: Edwin Cap, DPM;  Location: [facility];  Service: Podiatry;  Laterality: Left;  sagittal saw, 3L bag saline & Pulse   BONE BIOPSY Right 05/25/2023   Procedure: BONE BIOPSY OF RIGHT  GREAT TOE;  Surgeon: Pilar Plate, DPM;  Location: [facility];  Service: Orthopedics/Podiatry;  Laterality: Right;  Great toe bone biopsy versus amputation   Cardiac catherization     CHOLECYSTECTOMY     FEMORAL-TIBIAL BYPASS GRAFT Right 06/01/2023   Procedure: RIGHT LEG BYPASS GRAFT SUPERFICIAL FEMORAL-ANTERIOR TIBIAL ARTERY WITH RIGHT NON-REVERSED GREATER SAPHENOUS VEIN;  Surgeon: Cephus Shelling, MD;  Location: [facility];  Service: Vascular;  Laterality: Right;   I & D EXTREMITY Left 08/30/2021   Procedure: IRRIGATION AND DEBRIDEMENT WITH BONE BIOPSY;  Surgeon: Candelaria Stagers, DPM;  Location: [facility];  Service: Podiatry;  Laterality: Left;   IR ANGIO VERTEBRAL SEL SUBCLAVIAN INNOMINATE UNI L MOD SED  01/18/2022   IR CT HEAD LTD  01/08/2022   IR INTRA CRAN STENT  01/08/2022   IR PERCUTANEOUS ART THROMBECTOMY/INFUSION INTRACRANIAL INC DIAG ANGIO  01/08/2022   IRRIGATION AND DEBRIDEMENT FOOT Left 09/04/2021   Procedure: IRRIGATION AND DEBRIDEMENT FOOT AND CLOSURE;  Surgeon: Edwin Cap, DPM;  [facility]: [facility];  Service: Podiatry;  Laterality: Left;   LEFT HEART CATH AND CORONARY ANGIOGRAPHY N/A 07/26/2022   Procedure: LEFT HEART CATH AND CORONARY ANGIOGRAPHY;  Surgeon: Sw[place]il[place], Peter M, MD;  Location: [facility] INVASIVE CV LAB;  Service: Cardiovascular;  Laterality: N/A;   METATARSAL OSTEOTOMY Left 07/21/2022   Procedure: LEFT FOOT EXCISION OF FIFTH METATARSAL WITH DEBRIDEMENT OF ULCER, POSSIBLE BONE BIOPSY OF THE FOURTH METATARSAL, POSSIBLE FOURTH RAY AMPUTATION OF LEFT TOEAND METATARSAL;  Surgeon: Vivi Barrack, DPM;  Location: [facility]  OR;  Service: Podiatry;  Laterality: Left;   PERIPHERAL VASCULAR BALLOON ANGIOPLASTY Left 06/10/2022   Procedure: PERIPHERAL VASCULAR BALLOON ANGIOPLASTY;  Surgeon: Cephus Shelling, MD;  [facility];  Service: Cardiovascular;  Laterality: Left;  AT and DP   PERIPHERAL VASCULAR INTERVENTION Left 06/10/2022   Procedure: PERIPHERAL VASCULAR  INTERVENTION;  Surgeon: Cephus Shelling, MD;  Location: [facility] INVASIVE CV LAB;  Service: Cardiovascular;  Laterality: Left;  SFA   RADIOLOGY WITH ANESTHESIA N/A 01/08/2022   Procedure: IR WITH ANESTHESIA;  Surgeon: Radiologist, Medication, MD;  [facility]: [facility];  Service: Radiology;  Laterality: N/A;   THROMBECTOMY BRACHIAL ARTERY Right 11/18/2021   Procedure: RIGHT BRACHIAL, RADIAL, & ULNAR ARTERY THROMBECTOMY.;  Surgeon: Cephus Shelling, MD;  [facility]: [facility];  Service: Vascular;  Laterality: Right;   TUBAL LIGATION     VEIN HARVEST Right 06/01/2023   Procedure: VEIN HARVEST OF RIGHT GREATER SAPHENOUS VEIN;  Surgeon: Cephus Shelling, MD;  Location: [facility] OR;  Service: Vascular;  Laterality: Right;   HPI:  43 yo pt admitted 2/16 after mechanical fall with head injury. CT Head showed R SDH with 9mm midline shift s/p evacuation 2/17. Pt known to this SLP team, seen most recently in December 2024 for swallow eval (normal appearing swallow, regular solids/thin liquids recommended) and January 2025 for speech/language eval (mild deficits related to L side attention, awareness, and memory; independent with finances but receives assistance with medication management). PMH includes: CAD with prior MI, chronically anticoagulated with Coumadin CHF, CKD stage IV, diabetes, prior stroke 2023 (residual L weakness) and anemia    Assessment / Plan / Recommendation  Clinical Impression  Pt's oropharyngeal swallow appears to be functional across liquids and purees. It takes a lot of coaxing to encourage pt to take anything more solid, and when she does, she has trouble with the dry consistency and being able to clear her oral cavity. With multiple liquid washes she sufficiently clears her mouth, but she declines any further solid trials. Hopeful that this may improve as intake begins and her oral cavity is less dry; however, in light of current mentation, will start with Dys 2 (finely chopped) food and thin  liquids.  SLP Visit Diagnosis: Dysphagia, unspecified (R13.10)    Aspiration Risk  Risk for inadequate nutrition/hydration    Diet Recommendation Dysphagia 2 (Fine chop);Thin liquid    Liquid Administration via: Cup;Straw Medication Administration: Whole meds with puree Supervision: Staff to assist with self feeding;Full supervision/cueing for compensatory strategies Compensations: Minimize environmental distractions;Slow rate;Small sips/bites;Follow solids with liquid Postural Changes: Seated upright at 90 degrees    Other  Recommendations Oral Care Recommendations: Oral care BID    Recommendations for follow up therapy are one component of a multi-disciplinary discharge planning process, led by the attending physician.  Recommendations may be updated based on patient status, additional functional criteria and insurance authorization.  Follow up Recommendations Acute inpatient rehab (3hours/day)      Assistance Recommended at Discharge    Functional Status Assessment Patient has had a recent decline in their functional status and demonstrates the ability to make significant improvements in function in a reasonable and predictable amount of time.  Frequency and Duration min 2x/week  2 weeks       Prognosis Prognosis for improved oropharyngeal function: Good Barriers to Reach Goals: Cognitive deficits      Swallow Study   General HPI: 43 yo pt admitted 2/16 after mechanical fall with head injury. CT Head showed R  SDH with 9mm midline shift s/p evacuation 2/17. Pt known to this SLP team, seen most recently in December 2024 for swallow eval (normal appearing swallow, regular solids/thin liquids recommended) and January 2025 for speech/language eval (mild deficits related to L side attention, awareness, and memory; independent with finances but receives assistance with medication management). PMH includes: CAD with prior MI, chronically anticoagulated with Coumadin CHF, CKD stage IV,  diabetes, prior stroke 2023 (residual L weakness) and anemia Type of Study: Bedside Swallow Evaluation Previous Swallow Assessment: see HPI Diet Prior to this Study: NPO Temperature Spikes Noted: Yes (101.4) Respiratory Status: Room air History of Recent Intubation: Yes Total duration of intubation (days):  (for procedure) Date extubated: 06/20/23 Behavior/Cognition: Alert;Distractible;Requires cueing;Other (Comment) (emotional) Oral Cavity Assessment: Dry Oral Care Completed by SLP: Recent completion by staff Oral Cavity - Dentition: Adequate natural dentition Vision: Functional for self-feeding Self-Feeding Abilities: Needs assist Patient Positioning: Upright in bed Baseline Vocal Quality: Normal Volitional Cough: Weak Volitional Swallow: Able to elicit    Oral/Motor/Sensory Function Overall Oral Motor/Sensory Function: Mild impairment (subtle L droop)   Ice Chips Ice chips: Within functional limits Presentation: Spoon   Thin Liquid Thin Liquid: Within functional limits Presentation: Spoon;Straw    Nectar Thick Nectar Thick Liquid: Not tested   Honey Thick Honey Thick Liquid: Not tested   Puree Puree: Within functional limits Presentation: Spoon   Solid     Solid: Impaired Oral Phase Impairments: Poor awareness of bolus;Impaired mastication Oral Phase Functional Implications: Prolonged oral transit      Mahala Menghini., M.A. CCC-SLP Acute Rehabilitation Services Office [phone]  Secure chat preferred  06/21/2023,9:41 AM

## 2023-06-21 NOTE — Evaluation (Signed)
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/01/2023   Critical lower limb ischemia (HCC) 06/01/2023   Osteomyelitis of great toe of right foot (HCC) 05/25/2023   Cellulitis of right lower extremity 05/24/2023   Dehydration 05/18/2023   Type 2 diabetes mellitus with hypoglycemia (HCC) 05/18/2023   Thrombocytosis 05/18/2023   Closed fracture of left proximal humerus 05/18/2023   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) 05/11/2023   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) 05/04/2023   Hemiparesis affecting left side as late effect of cerebrovascular accident (CVA) (HCC) 05/03/2023   Left humeral fracture 05/02/2023   Elevated LFTs 05/02/2023   SEMI (subendocardial myocardial infarction) (HCC) 04/30/2023   DKA (diabetic ketoacidosis) (HCC) 04/21/2023   GERD (gastroesophageal reflux disease) 04/21/2023   Acute metabolic encephalopathy 04/21/2023   Hypoalbuminemia due to protein-calorie malnutrition (HCC) 04/21/2023   Prolonged QT interval 04/21/2023   Seizure (HCC) 03/13/2023   Behavior concern in adult 03/06/2023   Chest pain due to CAD (HCC) 11/07/2022   Dizziness 10/11/2022   History of blood clots 09/29/2022   Abnormal TSH 09/13/2022   Lung nodule seen on imaging study 08/11/2022   Chronic nausea 08/02/2022   PVD (peripheral vascular disease) (HCC) 07/19/2022   DNR (do not resuscitate) 07/19/2022   Insomnia 04/07/2022   Ischemic cardiomyopathy    Elevated troponin    HFrEF (heart failure with reduced ejection fraction) /chronic systolic dysfunction CHF with EF of 30 to 35%    Right carotid artery occlusion 01/08/2022    Internal carotid artery occlusion, 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   Menorrhagia with irregular cycle 12/15/2021   Foot ulcer (HCC) 08/27/2021   Hypokalemia 08/27/2021   Chronic combined systolic and diastolic heart failure (HCC) 06/18/2021   History of CVA (cerebrovascular accident) 06/18/2021   Menorrhagia with regular cycle 07/23/2020   Iron deficiency anemia due to chronic blood loss 07/23/2020   Menstrual cramps 07/23/2020   Obesity, Class II, BMI 35-39.9 06/18/2020   CAD (coronary artery disease) 06/18/2020   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) - baseline Scr 2.8-3.0 06/18/2020   Angina at rest [facility]) 06/18/2020   Mixed hyperlipidemia    Class 2 obesity due to excess calories without serious comorbidity with body mass index (BMI) of 39.0 to 39.9 in adult    Acute kidney injury superimposed on stage 4 chronic kidney disease (HCC) 01/01/2020   Tobacco abuse 01/01/2020   Atypical chest pain 12/31/2019   Left sided numbness 12/31/2019   Vitamin D deficiency 07/24/2015   Essential hypertension 03/17/2015   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) 03/07/2015   Acquired hypothyroidism 03/07/2015   Past Medical History:  Past Medical History:  Diagnosis Date   Acute ischemic stroke (HCC) 01/01/2020   Acute kidney injury superimposed on CKD (HCC) 01/01/2020   Acute right MCA stroke (HCC) 01/28/2022   Anemia    CAD (coronary artery disease)    a. s/p cath in 03/2014 showing 30% mid-LAD, moderate to severe disease along small D1, patent LCx, moderate to severe distal OM2 stenosis and moderate diffuse diease along RCA not amenable to PCI   Cerebrovascular accident (CVA) (HCC) 12/31/2019   CHF (congestive heart failure) (HCC)  a. EF 55-60% in 12/2019 b. EF at 35-40% by echo in 05/2020   CKD (chronic kidney disease) stage 4, GFR 15-29 ml/min (HCC)    Diabetes mellitus without complication (HCC)    Myocardial infarction (HCC)    Neuropathy    PONV (postoperative nausea  and vomiting)    PVD (peripheral vascular disease) (HCC)    Stroke (HCC) 01/2022   L hand weakness   Past Surgical History:  Past Surgical History:  Procedure Laterality Date   ABDOMINAL AORTOGRAM W/LOWER EXTREMITY N/A 06/10/2022   Procedure: ABDOMINAL AORTOGRAM W/LOWER EXTREMITY;  Surgeon: Cephus Shelling, MD;  Location: [facility];  Service: Cardiovascular;  Laterality: N/A;   ABDOMINAL AORTOGRAM W/LOWER EXTREMITY Right 05/26/2023   Procedure: ABDOMINAL AORTOGRAM W/LOWER EXTREMITY;  Surgeon: Cephus Shelling, MD;  Location: [facility];  Service: Cardiovascular;  Laterality: Right;   AMPUTATION Left 09/02/2021   Procedure: AMPUTATION RAY;  Surgeon: Edwin Cap, DPM;  Location: [facility];  Service: Podiatry;  Laterality: Left;  sagittal saw, 3L bag saline & Pulse   BONE BIOPSY Right 05/25/2023   Procedure: BONE BIOPSY OF RIGHT GREAT TOE;  Surgeon: Pilar Plate, DPM;  Location: [facility];  Service: Orthopedics/Podiatry;  Laterality: Right;  Great toe bone biopsy versus amputation   Cardiac catherization     CHOLECYSTECTOMY     FEMORAL-TIBIAL BYPASS GRAFT Right 06/01/2023   Procedure: RIGHT LEG BYPASS GRAFT SUPERFICIAL FEMORAL-ANTERIOR TIBIAL ARTERY WITH RIGHT NON-REVERSED GREATER SAPHENOUS VEIN;  Surgeon: Cephus Shelling, MD;  Location: [facility];  Service: Vascular;  Laterality: Right;   I & D EXTREMITY Left 08/30/2021   Procedure: IRRIGATION AND DEBRIDEMENT WITH BONE BIOPSY;  Surgeon: Candelaria Stagers, DPM;  Location: [facility];  Service: Podiatry;  Laterality: Left;   IR ANGIO VERTEBRAL SEL SUBCLAVIAN INNOMINATE UNI L MOD SED  01/18/2022   IR CT HEAD LTD  01/08/2022   IR INTRA CRAN STENT  01/08/2022   IR PERCUTANEOUS ART THROMBECTOMY/INFUSION INTRACRANIAL INC DIAG ANGIO  01/08/2022   IRRIGATION AND DEBRIDEMENT FOOT Left 09/04/2021   Procedure: IRRIGATION AND DEBRIDEMENT FOOT AND CLOSURE;  Surgeon: Edwin Cap, DPM;  [facility]: [facility];  Service: Podiatry;   Laterality: Left;   LEFT HEART CATH AND CORONARY ANGIOGRAPHY N/A 07/26/2022   Procedure: LEFT HEART CATH AND CORONARY ANGIOGRAPHY;  Surgeon: [place]il[place], Peter M, MD;  Location: [facility] INVASIVE CV LAB;  Service: Cardiovascular;  Laterality: N/A;   METATARSAL OSTEOTOMY Left 07/21/2022   Procedure: LEFT FOOT EXCISION OF FIFTH METATARSAL WITH DEBRIDEMENT OF ULCER, POSSIBLE BONE BIOPSY OF THE FOURTH METATARSAL, POSSIBLE FOURTH RAY AMPUTATION OF LEFT TOEAND METATARSAL;  Surgeon: Vivi Barrack, DPM;  Location: [facility];  Service: Podiatry;  Laterality: Left;   PERIPHERAL VASCULAR BALLOON ANGIOPLASTY Left 06/10/2022   Procedure: PERIPHERAL VASCULAR BALLOON ANGIOPLASTY;  Surgeon: Cephus Shelling, MD;  [facility];  Service: Cardiovascular;  Laterality: Left;  AT and DP   PERIPHERAL VASCULAR INTERVENTION Left 06/10/2022   Procedure: PERIPHERAL VASCULAR INTERVENTION;  Surgeon: Cephus Shelling, MD;  Location: [facility] INVASIVE CV LAB;  Service: Cardiovascular;  Laterality: Left;  SFA   RADIOLOGY WITH ANESTHESIA N/A 01/08/2022   Procedure: IR WITH ANESTHESIA;  Surgeon: Radiologist, Medication, MD;  Location: [facility];  Service: Radiology;  Laterality: N/A;   THROMBECTOMY BRACHIAL ARTERY Right 11/18/2021   Procedure: RIGHT BRACHIAL, RADIAL, & ULNAR ARTERY THROMBECTOMY.;  Surgeon: Cephus Shelling, MD;  Location: [facility];  Service: Vascular;  Laterality: Right;  TUBAL LIGATION     VEIN HARVEST Right 06/01/2023   Procedure: VEIN HARVEST OF RIGHT GREATER SAPHENOUS VEIN;  Surgeon: Cephus Shelling, MD;  Location: [facility] OR;  Service: Vascular;  Laterality: Right;   HPI:  43 yo pt admitted 2/16 after mechanical fall with head injury. CT Head showed R SDH with 9mm midline shift s/p evacuation 2/17. Pt known to this SLP team, seen most recently in December 2024 for swallow eval (normal appearing swallow, regular solids/thin liquids recommended) and January 2025 for speech/language eval (mild deficits  related to L side attention, awareness, and memory; independent with finances but receives assistance with medication management). PMH includes: CAD with prior MI, chronically anticoagulated with Coumadin CHF, CKD stage IV, diabetes, prior stroke 2023 (residual L weakness) and anemia   Assessment / Plan / Recommendation Clinical Impression  Pt has impaired attention to task, very distracted by internal distractors including pain. With cues to sustain her attention she can follow more one-step commands and participate in simple conversation about reasoning. She is fairly oriented and acknowledges some of her acute changes but rather than initiating problem solving she has a tendency to become emotional. She recalled 0/4 words after delayed recall, increasing up to 2/4 words when given Max cues. Pt's current presentation seems to be acutely exacerbated from her baseline per documentation last month. Per her report, it sounds like she was getting more assistance at home since this last visit, although unclear how reliable she is as a historian at the moment. Will need to confirm with family. Pt would benefit from ongoing SLP f/u acutely and at next level of care.    SLP Assessment  SLP Recommendation/Assessment: Patient needs continued Speech Lanaguage Pathology Services SLP Visit Diagnosis: Cognitive communication deficit (R41.841)    Recommendations for follow up therapy are one component of a multi-disciplinary discharge planning process, led by the attending physician.  Recommendations may be updated based on patient status, additional functional criteria and insurance authorization.    Follow Up Recommendations  Acute inpatient rehab (3hours/day)    Assistance Recommended at Discharge  Frequent or constant Supervision/Assistance  Functional Status Assessment Patient has had a recent decline in their functional status and demonstrates the ability to make significant improvements in function in a  reasonable and predictable amount of time.  Frequency and Duration min 2x/week  2 weeks      SLP Evaluation Cognition  Overall Cognitive Status: No family/caregiver present to determine baseline cognitive functioning Arousal/Alertness: Awake/alert Orientation Level: Oriented to person;Oriented to place;Oriented to situation Attention: Sustained Sustained Attention: Impaired Sustained Attention Impairment: Verbal basic;Functional basic Memory: Impaired Memory Impairment: Storage deficit;Retrieval deficit;Decreased recall of new information Problem Solving: Impaired Problem Solving Impairment: Functional basic Executive Function: Reasoning Reasoning: Impaired Reasoning Impairment: Verbal basic Behaviors: Lability Safety/Judgment: Impaired       Comprehension  Auditory Comprehension Overall Auditory Comprehension: Impaired Commands: Impaired One Step Basic Commands: 75-100% accurate Conversation: Simple Interfering Components: Attention    Expression Expression Primary Mode of Expression: Verbal Verbal Expression Overall Verbal Expression: Impaired Pragmatics: Impairment Impairments: Eye contact;Topic maintenance;Turn Taking Non-Verbal Means of Communication: Not applicable   Oral / Motor  Oral Motor/Sensory Function Overall Oral Motor/Sensory Function: Mild impairment (subtle L droop) Motor Speech Overall Motor Speech: Impaired (mildly slurred, but intelligible)            Mahala Menghini., M.A. CCC-SLP Acute Rehabilitation Services Office [phone]  Secure chat preferred  06/21/2023, 11:02 AM

## 2023-06-22 ENCOUNTER — Encounter (HOSPITAL_COMMUNITY): Payer: Self-pay | Admitting: Neurological Surgery

## 2023-06-22 ENCOUNTER — Other Ambulatory Visit (HOSPITAL_COMMUNITY): Payer: Self-pay

## 2023-06-22 DIAGNOSIS — N183 Chronic kidney disease, stage 3 unspecified: Secondary | ICD-10-CM | POA: Diagnosis not present

## 2023-06-22 DIAGNOSIS — I5022 Chronic systolic (congestive) heart failure: Secondary | ICD-10-CM | POA: Diagnosis not present

## 2023-06-22 DIAGNOSIS — S065XAA Traumatic subdural hemorrhage with loss of consciousness status unknown, initial encounter: Secondary | ICD-10-CM | POA: Diagnosis not present

## 2023-06-22 DIAGNOSIS — E109 Type 1 diabetes mellitus without complications: Secondary | ICD-10-CM | POA: Diagnosis not present

## 2023-06-22 LAB — CBC
HCT: 21.6 % — ABNORMAL LOW (ref 36.0–46.0)
Hemoglobin: 6.7 g/dL — CL (ref 12.0–15.0)
MCH: 28.2 pg (ref 26.0–34.0)
MCHC: 31 g/dL (ref 30.0–36.0)
MCV: 90.8 fL (ref 80.0–100.0)
Platelets: 259 10*3/uL (ref 150–400)
RBC: 2.38 MIL/uL — ABNORMAL LOW (ref 3.87–5.11)
RDW: 19.4 % — ABNORMAL HIGH (ref 11.5–15.5)
WBC: 10.1 10*3/uL (ref 4.0–10.5)
nRBC: 0 % (ref 0.0–0.2)

## 2023-06-22 LAB — HEMOGLOBIN AND HEMATOCRIT, BLOOD
HCT: 23.9 % — ABNORMAL LOW (ref 36.0–46.0)
Hemoglobin: 7.7 g/dL — ABNORMAL LOW (ref 12.0–15.0)

## 2023-06-22 LAB — GLUCOSE, CAPILLARY
Glucose-Capillary: 78 mg/dL (ref 70–99)
Glucose-Capillary: 130 mg/dL — ABNORMAL HIGH (ref 70–99)
Glucose-Capillary: 103 mg/dL — ABNORMAL HIGH (ref 70–99)
Glucose-Capillary: 133 mg/dL — ABNORMAL HIGH (ref 70–99)
Glucose-Capillary: 84 mg/dL (ref 70–99)

## 2023-06-22 LAB — PREPARE RBC (CROSSMATCH)

## 2023-06-22 MED ORDER — INSULIN ASPART 100 UNIT/ML IJ SOLN: 0.0000 [IU] | Freq: Every day | INTRAMUSCULAR | Status: DC

## 2023-06-22 MED ORDER — POLYETHYLENE GLYCOL 3350 17 G PO PACK
17.0000 g | PACK | Freq: Every day | ORAL | Status: DC
  Administered 2023-06-23 – 2023-06-30 (×8): 17 g via ORAL
  Filled 2023-06-22 (×8): qty 1

## 2023-06-22 MED ORDER — ENSURE ENLIVE PO LIQD
237.0000 mL | Freq: Two times a day (BID) | ORAL | Status: DC
  Administered 2023-06-23 – 2023-06-29 (×11): 237 mL via ORAL

## 2023-06-22 MED ORDER — INSULIN ASPART 100 UNIT/ML IJ SOLN
0.0000 [IU] | Freq: Three times a day (TID) | INTRAMUSCULAR | Status: DC
  Administered 2023-06-22: 3 [IU] via SUBCUTANEOUS
  Administered 2023-06-23: 4 [IU] via SUBCUTANEOUS

## 2023-06-22 MED ORDER — SODIUM CHLORIDE 0.9% IV SOLUTION: Freq: Once | INTRAVENOUS | Status: DC

## 2023-06-22 MED ORDER — QUETIAPINE FUMARATE 50 MG PO TABS
25.0000 mg | ORAL_TABLET | Freq: Two times a day (BID) | ORAL | Status: DC
  Administered 2023-06-22 – 2023-07-01 (×19): 25 mg via ORAL
  Filled 2023-06-22 (×19): qty 1

## 2023-06-22 NOTE — Progress Notes (Signed)
Speech Language Pathology Treatment: Dysphagia;Cognitive-Linquistic  Patient Details Name: Lauren [name] MRN: [id number] DOB: 01-01-1981 Today's Date: 06/22/2023 Time: 4098-1191 SLP Time Calculation (min) (ACUTE ONLY): 19 min  Assessment / Plan / Recommendation Clinical Impression  Pt was seen with lunch meal, needing a lot of encouragement to try solids and emotionally still quite labile. She provides some conflicting information about solids, initially mixing the chopped foods into the purees because they are "too hard." She then became upset because she wanted more solid foods, like biscuits. SLP offered advanced trials of graham crackers, which she said she could chew and swallow but also said they were so hard that they were painful. Overall, oral preparation and clearance seemed to be appropriate regardless of the consistency of solids. What appeared to impact her the most was her sustained attention, with SLP providing Mod cues to sustain attention to POs. She also interjects during education and conversation, requiring pragmatic cues for turn taking and topic maintenance. Considering pt report but also clinical presentation, I do think she could advance her solids which may help with PO intake, but will only go as far as mechanical soft.    HPI HPI: 43 yo pt admitted 2/16 after mechanical fall with head injury. CT Head showed R SDH with 9mm midline shift s/p evacuation 2/17. Pt known to this SLP team, seen most recently in December 2024 for swallow eval (normal appearing swallow, regular solids/thin liquids recommended) and January 2025 for speech/language eval (mild deficits related to L side attention, awareness, and memory; independent with finances but receives assistance with medication management). PMH includes: CAD with prior MI, chronically anticoagulated with Coumadin CHF, CKD stage IV, diabetes, prior stroke 2023 (residual L weakness) and anemia      SLP Plan  Continue with current  plan of care      Recommendations for follow up therapy are one component of a multi-disciplinary discharge planning process, led by the attending physician.  Recommendations may be updated based on patient status, additional functional criteria and insurance authorization.    Recommendations  Diet recommendations: Dysphagia 3 (mechanical soft);Thin liquid Liquids provided via: Cup;Straw Medication Administration: Whole meds with puree Supervision: Patient able to self feed;Full supervision/cueing for compensatory strategies Compensations: Minimize environmental distractions;Slow rate;Small sips/bites;Follow solids with liquid Postural Changes and/or Swallow Maneuvers: Seated upright 90 degrees                  Oral care BID   Frequent or constant Supervision/Assistance Cognitive communication deficit (R41.841);Dysphagia, unspecified (R13.10)     Continue with current plan of care     Mahala Menghini., M.A. CCC-SLP Acute Rehabilitation Services Office [phone]  Secure chat preferred   06/22/2023, 3:42 PM

## 2023-06-22 NOTE — Progress Notes (Signed)
Patient ID: Lauren [name], female   DOB: 01-Apr-1981, 43 y.o.   MRN: [id number] Vital signs are stable patient is awake alert and more cooperative today than she had been previously the dressing is intact with the dressing in place.  She can be mobilized as tolerated.  Will hold off on follow-up CT at this time and she is doing well.

## 2023-06-22 NOTE — Progress Notes (Signed)
  Progress Note   Date: 06/22/2023  Patient Name: Lauren [name]        MRN#: [id number]  Clarification of diagnosis: Stage IV chronic kidney disease

## 2023-06-22 NOTE — TOC Initial Note (Signed)
Transition of Care [facility]) - Initial/Assessment Note    Patient Details  Name: Lauren [name] MRN: [id number] Date of Birth: 1981/02/19  Transition [facility]) CM/SW Contact:    Glennon Mac, RN Phone Number: 06/21/2023; 5:10pm  Clinical Narrative:                 Telephone call to patient's husband to discuss discharge planning.  PT/OT recommending skilled nursing facility, and spouse is in favor of this, though he states his insurance is not good.  Apparently patient had admission in January and exhaustive attempts were made by multiple CSW and RN case managers to place patient, without success.  Patient was sent home with home health services through [facility], to include RN, PT, OT and aide.  Patient's husband states that he and his daughter are able to provide only intermittent assistance, as they both work.  He states there are periods of time that patient is left alone in the home.  He states that he and his wife were separating, and he wanted her to still have insurance, due to her multiple medical problems.  Attempts have been made to obtain Medicaid, but currently patient's husband makes too much money for [employer] approval.  He asked that I call his daughter, Al Decant to discuss this issue. Spoke with patient's daughter, per request.  She basically provided the same story that patient's spouse had provided earlier.  She is extremely concerned about bringing patient back home, as she knows that she will fall again.  Patient and family unable to pay privately for SNF or private duty sitters.  She asked, "what will happen if we refused to take her home?"  Explained that patient may be responsible for hospital charges, if she is medically stable and family refuses to discharge home.  If family refuses to take patient home and give up their decision making rights, patient may need a guardian, and will be referred to APS.  TOC will continue to follow for disposition.  This is a very  challenging case, as clearly patient is not safe to discharge home where she will be left alone for periods of time, yet does not have insurance benefits for SNF placement.    Barriers to Discharge: Continued Medical Work up              Expected Discharge Plan and Services   Discharge Planning Services: CM Consult Post Acute Care Choice: Home Health, Resumption of Svcs/PTA Provider, Durable Medical Equipment Living arrangements for the past 2 months: Single Family Home                                      Prior Living Arrangements/Services Living arrangements for the past 2 months: Single Family Home Lives with:: Spouse, Adult Children Patient language and need for interpreter reviewed:: Yes        Need for Family Participation in Patient Care: Yes (Comment) Care giver support system in place?: Yes (comment) (Intermittent) Current home services: DME Criminal Activity/Legal Involvement Pertinent to Current Situation/Hospitalization: No - Comment as needed            Emotional Assessment Appearance:: Appears older than stated age Attitude/Demeanor/Rapport: Reactive Affect (typically observed): Tearful/Crying Orientation: : Oriented to Self, Oriented to Place, Oriented to  Time, Oriented to Situation      Admission diagnosis:  Subdural hematoma (HCC) [S06.5XAA] Patient Active Problem List  Diagnosis Date Noted   Subdural hematoma (HCC) 06/19/2023   Femoral-tibial bypass graft occlusion, right (HCC) 06/01/2023   Critical lower limb ischemia (HCC) 06/01/2023   Osteomyelitis of great toe of right foot (HCC) 05/25/2023   Cellulitis of right lower extremity 05/24/2023   Dehydration 05/18/2023   Type 2 diabetes mellitus with hypoglycemia (HCC) 05/18/2023   Thrombocytosis 05/18/2023   Closed fracture of left proximal humerus 05/18/2023   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) 05/11/2023   Type 1 diabetes mellitus with diabetic chronic  kidney disease (HCC) 05/04/2023   Hemiparesis affecting left side as late effect of cerebrovascular accident (CVA) (HCC) 05/03/2023   Left humeral fracture 05/02/2023   Elevated LFTs 05/02/2023   SEMI (subendocardial myocardial infarction) (HCC) 04/30/2023   DKA (diabetic ketoacidosis) (HCC) 04/21/2023   GERD (gastroesophageal reflux disease) 04/21/2023   Acute metabolic encephalopathy 04/21/2023   Hypoalbuminemia due to protein-calorie malnutrition (HCC) 04/21/2023   Prolonged QT interval 04/21/2023   Seizure (HCC) 03/13/2023   Behavior concern in adult 03/06/2023   Chest pain due to CAD (HCC) 11/07/2022   Dizziness 10/11/2022   History of blood clots 09/29/2022   Abnormal TSH 09/13/2022   Lung nodule seen on imaging study 08/11/2022   Chronic nausea 08/02/2022   PVD (peripheral vascular disease) (HCC) 07/19/2022   DNR (do not resuscitate) 07/19/2022   Insomnia 04/07/2022   Ischemic cardiomyopathy    Elevated troponin    HFrEF (heart failure with reduced ejection fraction) /chronic systolic dysfunction CHF with EF of 30 to 35%    Right carotid artery occlusion 01/08/2022   Internal carotid artery occlusion, 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   Menorrhagia with irregular cycle 12/15/2021   Foot ulcer (HCC) 08/27/2021   Hypokalemia 08/27/2021   Chronic combined systolic and diastolic heart failure (HCC) 06/18/2021   History of CVA (cerebrovascular accident) 06/18/2021   Menorrhagia with regular cycle 07/23/2020   Iron deficiency anemia due to chronic blood loss 07/23/2020   Menstrual cramps 07/23/2020   Obesity, Class II, BMI 35-39.9 06/18/2020   CAD (coronary artery disease) 06/18/2020   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) - baseline Scr 2.8-3.0 06/18/2020   Angina at rest [facility]) 06/18/2020   Mixed hyperlipidemia    Class 2 obesity due to excess calories without serious comorbidity with body mass index (BMI) of 39.0 to 39.9 in adult    Acute kidney injury superimposed on stage 4  chronic kidney disease (HCC) 01/01/2020   Tobacco abuse 01/01/2020   Atypical chest pain 12/31/2019   Left sided numbness 12/31/2019   Vitamin D deficiency 07/24/2015   Essential hypertension 03/17/2015   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) 03/07/2015   Acquired hypothyroidism 03/07/2015   PCP:  Billie Lade, MD Pharmacy:   [facility] [address] [place] - [address] [place] [address] Phone: [phone] Fax: [phone]  [facility] [place], [place] - [address] [place] [address] Phone: [phone] Fax: [phone]  [facility] - [address], [place] - [address] [place] [address] Phone: [phone] Fax: [phone]  [facility] [place], [place] - [address] [place] [address] Phone: [phone] Fax: [phone]     Social Drivers of Health (SDOH) Social History: SDOH Screenings   Food Insecurity: Patient Unable To Answer (06/19/2023)  Recent Concern: Food Insecurity - Food Insecurity  Present (05/21/2023)  Housing: Patient Declined (06/19/2023)  Transportation Needs: No Transportation Needs (06/19/2023)  Recent Concern: Transportation Needs - Unmet Transportation Needs (05/18/2023)  Utilities: Not At Risk (06/19/2023)  Alcohol Screen: Low Risk  (11/09/2022)  Depression (PHQ2-9): Low Risk  (02/18/2023)  Recent Concern: Depression (PHQ2-9) - Medium Risk (02/04/2023)  Financial Resource Strain: Low Risk  (11/09/2022)  Physical Activity: Inactive (03/03/2022)  Social Connections: Socially Integrated (03/03/2022)  Stress: No Stress Concern Present (03/03/2022)  Recent Concern: Stress - Stress Concern Present (12/22/2021)  Tobacco Use: High Risk (06/20/2023)   SDOH Interventions:     Readmission Risk Interventions    06/09/2023    3:19 PM 05/18/2023    1:36 PM 04/22/2023    3:05  PM  Readmission Risk Prevention Plan  Transportation Screening Complete Complete Complete  Medication Review Oceanographer) Complete Complete Complete  PCP or Specialist appointment within 3-5 days of discharge   Not Complete  HRI or Home Care Consult Complete Complete Complete  SW Recovery Care/Counseling Consult Complete Complete Complete  Palliative Care Screening Not Applicable Not Applicable Not Applicable  Skilled Nursing Facility Not Applicable Complete Not Applicable   Quintella Baton, RN, BSN  Trauma/Neuro ICU Case Manager [phone]

## 2023-06-22 NOTE — Progress Notes (Signed)
  Progress Note   Date: 06/22/2023  Patient Name: Lauren [name]        MRN#: [id number]   The diagnosis of hyponatremia  Hyponatremia ruled in (please provide the supporting clinical data/criteria used)

## 2023-06-22 NOTE — Progress Notes (Signed)
  Progress Note   Date: 06/22/2023  Patient Name: Lauren [name]        MRN#: [id number]  Review the patient's clinical findings supports the diagnosis of:   Blood loss anemia, site undetermined at present

## 2023-06-22 NOTE — Progress Notes (Signed)
NAME:  Lauren [name], MRN:  [id number], DOB:  1980-10-31, LOS: 3 ADMISSION DATE:  06/19/2023, CONSULTATION DATE:  06/19/23 REFERRING MD:  Dr. Estell Harpin - EDP, CHIEF COMPLAINT:  [facility]   History of Present Illness:  Lauren [name] is a 43 year old female with a past medical history significant for CAD with prior MI, chronically anticoagulated with Coumadin CHF, CKD stage IV, diabetes, prior stroke 2023 and anemia who presented to the ED at [facility] 2/16 via EMS after suffering mechanical fall at home with head injury.  On ED arrival patient was seen with mild hypotension but hemodynamically stable.  Lab work significant for NA 134, glucose 237, creatinine 2.37, GFR 26, albumin 2.7, WBC 19.0, INR supratherapeutic at 7.8.  Given fall with concern of head injury head CT was obtained on arrival and revealed right acute subdural hematoma measuring 2.1 with 9 mm midline shift.  Neurosurgery consulted with recommendations made to transfer to [facility] for further care.  PCCM consulted for further management admission  Pertinent  Medical History  CAD with prior MI, chronically anticoagulated with Coumadin CHF, CKD stage IV, diabetes, prior stroke 2023 and anemia   Significant Hospital Events: Including procedures, antibiotic start and stop dates in addition to other pertinent events   2/16 presented to ED via EMS after suffering a fall at home with head injury.  Head CT with acute SDH with midline shift in the setting of supratherapeutic INR  Interim History / Subjective:  No overnight events More interactive today, Calm  Objective   Blood pressure 125/82, pulse 90, temperature 98.5 F (36.9 C), temperature source Axillary, resp. rate 15, height 5\' 5"  (1.651 m), weight 77.2 kg, last menstrual period 05/25/2023, SpO2 99%.        Intake/Output Summary (Last 24 hours) at 06/22/2023 0809 Last data filed at 06/22/2023 4098 Gross per 24 hour  Intake 129.29 ml  Output 250 ml  Net -120.71 ml   Filed  Weights   06/20/23 0342 06/21/23 0425 06/22/23 0717  Weight: 75.4 kg 77.2 kg 77.2 kg    Examination: General: Does not appear to be in distress More interactive and responsive today HEENT: Moist mucosa Neuro: Interacts appropriately, following commands CV: S1-S2 appreciated PULM: Clear breath sounds GI: Soft, bruising Extremities: Warm and dry, no evidence of significant swelling or hematoma formation Skin: no rashes or lesions    Resolved Hospital Problem list     Assessment & Plan:   Acute subdural hematoma with midline shift and brain compression -Acute right mixed density subdural hematoma with cerebral loculation suggesting some chronic features -Coumadin reversed in the ED -Encephalopathy appears to be improving History of seizure disorder History of prior CVA with left-sided residual weakness -Continue delirium precautions  -Neuroprotective measures  Limit sedating medications  Chronic heart failure with reduced ejection fraction history of coronary artery disease with prior MI On Toprol-XL -Labetalol as needed  Chronic kidney disease stage III -Avoid nephrotoxic medications -Continue to trend parameters  Continue SSI for diabetes  Hypothyroidism -Continue Synthroid  Continue statins   As needed oxycodone for pain  Will attempt to wean off Precedex  Ongoing constipation -Being addressed  Best Practice (right click and "Reselect all SmartList Selections" daily)   Diet/type: NPO; slp eval today; patient/family refuses feeding tube DVT prophylaxis SCD Pressure ulcer(s): N/A GI prophylaxis: PPI Lines: N/A Foley:  N/A Code Status:  full code, family states she is okay with compressions and shocks but no intubation or feeding tubes Last date of multidisciplinary goals of  care discussion: 2/17 updated daughter and friend at bedside  Labs   CBC: Recent Labs  Lab 06/19/23 1010 06/20/23 0506 06/21/23 0330 06/21/23 1224 06/22/23 0621  WBC 19.0*  18.3* 14.2*  --  10.1  NEUTROABS 16.9*  --   --   --   --   HGB 11.7* 10.0* 7.1* 7.1* 6.7*  HCT 36.9 32.2* 22.6* 22.5* 21.6*  MCV 89.8 90.4 90.4  --  90.8  PLT 389 328 265  --  259    Basic Metabolic Panel: Recent Labs  Lab 06/19/23 1010 06/20/23 0506 06/20/23 2000 06/21/23 0330  NA 134* 138 140 140  K 3.7 4.0 3.6 3.9  CL 101 102 107 107  CO2 21* 17* 22 19*  GLUCOSE 237* 246* 85 243*  BUN 18 22* 21* 22*  CREATININE 2.37* 2.70* 2.27* 2.29*  CALCIUM 8.1* 8.6* 7.9* 7.9*  MG  --  1.7  --   --   PHOS  --  3.1  --   --    GFR: Estimated Creatinine Clearance: 33.6 mL/min (A) (by C-G formula based on SCr of 2.29 mg/dL (H)). Recent Labs  Lab 06/19/23 1010 06/20/23 0506 06/21/23 0330 06/22/23 0621  WBC 19.0* 18.3* 14.2* 10.1    Liver Function Tests: Recent Labs  Lab 06/19/23 1010  AST 17  ALT 11  ALKPHOS 139*  BILITOT 1.0  PROT 6.4*  ALBUMIN 2.7*   No results for input(s): "LIPASE", "AMYLASE" in the last 168 hours. Recent Labs  Lab 06/20/23 1519  AMMONIA 17    ABG    Component Value Date/Time   PHART 7.284 (L) 06/01/2023 1317   PCO2ART 39.7 06/01/2023 1317   PO2ART 139 (H) 06/01/2023 1317   HCO3 18.8 (L) 06/01/2023 1317   TCO2 20 (L) 06/01/2023 1317   ACIDBASEDEF 7.0 (H) 06/01/2023 1317   O2SAT 99 06/01/2023 1317     Coagulation Profile: Recent Labs  Lab 06/19/23 1010 06/19/23 1410 06/20/23 0506 06/21/23 0330  INR 7.8* 1.5* 1.3* 1.1    Cardiac Enzymes: No results for input(s): "CKTOTAL", "CKMB", "CKMBINDEX", "TROPONINI" in the last 168 hours.  HbA1C: Hemoglobin A1C  Date/Time Value Ref Range Status  10/18/2022 09:26 AM 8.3 (A) 4.0 - 5.6 % Final   HbA1c POC (<> result, manual entry)  Date/Time Value Ref Range Status  09/29/2021 08:47 AM 9.6 4.0 - 5.6 % Final   Hgb A1c MFr Bld  Date/Time Value Ref Range Status  04/22/2023 11:21 AM 8.7 (H) 4.8 - 5.6 % Final    Comment:    (NOTE)         Prediabetes: 5.7 - 6.4         Diabetes: >6.4          Glycemic control for adults with diabetes: <7.0   07/19/2022 02:59 PM 7.0 (H) 4.8 - 5.6 % Final    Comment:    (NOTE)         Prediabetes: 5.7 - 6.4         Diabetes: >6.4         Glycemic control for adults with diabetes: <7.0     CBG: Recent Labs  Lab 06/21/23 1606 06/21/23 1939 06/21/23 2354 06/22/23 0346 06/22/23 0756  GLUCAP 182* 229* 122* 78 130*    Review of Systems:   Unable to assess   Past Medical History:  She,  has a past medical history of Acute ischemic stroke (HCC) (01/01/2020), Acute kidney injury superimposed on CKD (HCC) (01/01/2020), Acute right  MCA stroke (HCC) (01/28/2022), Anemia, CAD (coronary artery disease), Cerebrovascular accident (CVA) (HCC) (12/31/2019), CHF (congestive heart failure) (HCC), CKD (chronic kidney disease) stage 4, GFR 15-29 ml/min (HCC), Diabetes mellitus without complication (HCC), Myocardial infarction (HCC), Neuropathy, PONV (postoperative nausea and vomiting), PVD (peripheral vascular disease) (HCC), and Stroke (HCC) (01/2022).   Surgical History:   Past Surgical History:  Procedure Laterality Date   ABDOMINAL AORTOGRAM W/LOWER EXTREMITY N/A 06/10/2022   Procedure: ABDOMINAL AORTOGRAM W/LOWER EXTREMITY;  Surgeon: Cephus Shelling, MD;  Location: [facility] INVASIVE CV [facility];  Service: Cardiovascular;  Laterality: N/A;   ABDOMINAL AORTOGRAM W/LOWER EXTREMITY Right 05/26/2023   Procedure: ABDOMINAL AORTOGRAM W/LOWER EXTREMITY;  Surgeon: Cephus Shelling, MD;  Location: [facility];  Service: Cardiovascular;  Laterality: Right;   AMPUTATION Left 09/02/2021   Procedure: AMPUTATION RAY;  Surgeon: Edwin Cap, DPM;  Location: [facility] OR;  Service: Podiatry;  Laterality: Left;  sagittal saw, 3L bag saline & Pulse   BONE BIOPSY Right 05/25/2023   Procedure: BONE BIOPSY OF RIGHT GREAT TOE;  Surgeon: Pilar Plate, DPM;  Location: [facility] OR;  Service: Orthopedics/Podiatry;  Laterality: Right;  Great toe bone biopsy versus  amputation   Cardiac catherization     CHOLECYSTECTOMY     FEMORAL-TIBIAL BYPASS GRAFT Right 06/01/2023   Procedure: RIGHT LEG BYPASS GRAFT SUPERFICIAL FEMORAL-ANTERIOR TIBIAL ARTERY WITH RIGHT NON-REVERSED GREATER SAPHENOUS VEIN;  Surgeon: Cephus Shelling, MD;  Location: [facility] OR;  Service: Vascular;  Laterality: Right;   I & D EXTREMITY Left 08/30/2021   Procedure: IRRIGATION AND DEBRIDEMENT WITH BONE BIOPSY;  Surgeon: Candelaria Stagers, DPM;  Location: [facility] OR;  Service: Podiatry;  Laterality: Left;   IR ANGIO VERTEBRAL SEL SUBCLAVIAN INNOMINATE UNI L MOD SED  01/18/2022   IR CT HEAD LTD  01/08/2022   IR INTRA CRAN STENT  01/08/2022   IR PERCUTANEOUS ART THROMBECTOMY/INFUSION INTRACRANIAL INC DIAG ANGIO  01/08/2022   IRRIGATION AND DEBRIDEMENT FOOT Left 09/04/2021   Procedure: IRRIGATION AND DEBRIDEMENT FOOT AND CLOSURE;  Surgeon: Edwin Cap, DPM;  Location: [facility];  Service: Podiatry;  Laterality: Left;   LEFT HEART CATH AND CORONARY ANGIOGRAPHY N/A 07/26/2022   Procedure: LEFT HEART CATH AND CORONARY ANGIOGRAPHY;  Surgeon: [place], Peter M, MD;  [facility] INVASIVE CV LAB;  Service: Cardiovascular;  Laterality: N/A;   METATARSAL OSTEOTOMY Left 07/21/2022   Procedure: LEFT FOOT EXCISION OF FIFTH METATARSAL WITH DEBRIDEMENT OF ULCER, POSSIBLE BONE BIOPSY OF THE FOURTH METATARSAL, POSSIBLE FOURTH RAY AMPUTATION OF LEFT TOEAND METATARSAL;  Surgeon: Vivi Barrack, DPM;  Location: [facility];  Service: Podiatry;  Laterality: Left;   PERIPHERAL VASCULAR BALLOON ANGIOPLASTY Left 06/10/2022   Procedure: PERIPHERAL VASCULAR BALLOON ANGIOPLASTY;  Surgeon: Cephus Shelling, MD;  Location: [facility];  Service: Cardiovascular;  Laterality: Left;  AT and DP   PERIPHERAL VASCULAR INTERVENTION Left 06/10/2022   Procedure: PERIPHERAL VASCULAR INTERVENTION;  Surgeon: Cephus Shelling, MD;  Location: [facility] INVASIVE CV LAB;  Service: Cardiovascular;  Laterality: Left;  SFA   RADIOLOGY WITH  ANESTHESIA N/A 01/08/2022   Procedure: IR WITH ANESTHESIA;  Surgeon: Radiologist, Medication, MD;  Location: [facility];  Service: Radiology;  Laterality: N/A;   THROMBECTOMY BRACHIAL ARTERY Right 11/18/2021   Procedure: RIGHT BRACHIAL, RADIAL, & ULNAR ARTERY THROMBECTOMY.;  Surgeon: Cephus Shelling, MD;  Location: [facility];  Service: Vascular;  Laterality: Right;   TUBAL LIGATION     VEIN HARVEST Right 06/01/2023   Procedure: VEIN HARVEST OF  RIGHT GREATER SAPHENOUS VEIN;  Surgeon: Cephus Shelling, MD;  Location: [facility] OR;  Service: Vascular;  Laterality: Right;     Social History:   reports that she has been smoking cigarettes. She started smoking about 16 years ago. She has a 15 pack-year smoking history. She has been exposed to tobacco smoke. She has never used smokeless tobacco. She reports current alcohol use. She reports that she does not use drugs.   Family History:  Her family history includes Breast cancer (age of onset: 87) in her paternal grandmother; CAD in her father and mother; CVA in her mother; Cancer in her paternal grandmother; Hyperlipidemia in her father and mother; Hypertension in her father and mother; Thyroid disease in her mother.   Allergies Allergies  Allergen Reactions   Tape Other (See Comments)    Skin comes off with any adhesive   Visipaque [Iodixanol] Nausea And Vomiting    Patient had vagal response during procedure became hypertensive, and bradycardic. CO2 used during procedure prior to contrast being used, this may be cause as well. Recommended premedicating prior to contrast being used in the future.    Wellbutrin [Bupropion] Hives   Gabapentin     dizzy   Ciprofloxacin Hcl Hives and Rash    Hives/rash at injection site; 01/15/22 tolerated IV cipro    The patient is critically ill with multiple organ systems failure and requires high complexity decision making for assessment and support, frequent evaluation and titration of therapies, application of  advanced monitoring technologies and extensive interpretation of multiple databases. Critical Care Time devoted to patient care services described in this note independent of APP/resident time (if applicable)  is 30 minutes.   Virl Diamond MD [facility] Personal pager: See Amion If unanswered, please page CCM On-call: #[phone]

## 2023-06-23 DIAGNOSIS — I5022 Chronic systolic (congestive) heart failure: Secondary | ICD-10-CM | POA: Diagnosis not present

## 2023-06-23 DIAGNOSIS — N183 Chronic kidney disease, stage 3 unspecified: Secondary | ICD-10-CM | POA: Diagnosis not present

## 2023-06-23 DIAGNOSIS — S065XAA Traumatic subdural hemorrhage with loss of consciousness status unknown, initial encounter: Secondary | ICD-10-CM | POA: Diagnosis not present

## 2023-06-23 DIAGNOSIS — E109 Type 1 diabetes mellitus without complications: Secondary | ICD-10-CM | POA: Diagnosis not present

## 2023-06-23 LAB — CBC
HCT: 25.1 % — ABNORMAL LOW (ref 36.0–46.0)
Hemoglobin: 8.1 g/dL — ABNORMAL LOW (ref 12.0–15.0)
MCH: 28.2 pg (ref 26.0–34.0)
MCHC: 32.3 g/dL (ref 30.0–36.0)
MCV: 87.5 fL (ref 80.0–100.0)
Platelets: 329 10*3/uL (ref 150–400)
RBC: 2.87 MIL/uL — ABNORMAL LOW (ref 3.87–5.11)
RDW: 19.8 % — ABNORMAL HIGH (ref 11.5–15.5)
WBC: 12.8 10*3/uL — ABNORMAL HIGH (ref 4.0–10.5)
nRBC: 0 % (ref 0.0–0.2)

## 2023-06-23 LAB — GLUCOSE, CAPILLARY
Glucose-Capillary: 112 mg/dL — ABNORMAL HIGH (ref 70–99)
Glucose-Capillary: 107 mg/dL — ABNORMAL HIGH (ref 70–99)
Glucose-Capillary: 191 mg/dL — ABNORMAL HIGH (ref 70–99)
Glucose-Capillary: 122 mg/dL — ABNORMAL HIGH (ref 70–99)

## 2023-06-23 MED ORDER — GERHARDT'S BUTT CREAM
TOPICAL_CREAM | CUTANEOUS | Status: DC | PRN
  Filled 2023-06-23: qty 60

## 2023-06-23 MED ORDER — ISOSORBIDE MONONITRATE ER 30 MG PO TB24
60.0000 mg | ORAL_TABLET | Freq: Every day | ORAL | Status: DC
  Administered 2023-06-23 – 2023-07-01 (×9): 60 mg via ORAL
  Filled 2023-06-23 (×9): qty 2

## 2023-06-23 MED ORDER — HYDRALAZINE HCL 25 MG PO TABS
25.0000 mg | ORAL_TABLET | Freq: Three times a day (TID) | ORAL | Status: DC
  Administered 2023-06-23 – 2023-06-29 (×17): 25 mg via ORAL
  Filled 2023-06-23 (×18): qty 1

## 2023-06-23 NOTE — Progress Notes (Signed)
Occupational Therapy Treatment Patient Details Name: Lauren [name] MRN: [id number] DOB: 11-Jun-1980 Today's Date: 06/23/2023   History of present illness 43 yo female s/p 2/17 R fronto parietal cranitomy for SDH PMh DM1, seizures, CVA, CKD stage 4, NSTEMI, recent left proximal humerus fx after fall 12/23. 1/14-1/25 L ama R LE SFA to AT bypass on 1/29   OT comments  Patient stating she wanted to get OOB and eat breakfast upon entry. Patient able to get to EOB with mod assist and performed transfer to recliner with face to face technique and max assist. Setup provided for breakfast to address self feeding with patient requiring supervision and verbal cues to stay on task. Patient positioned in recliner with legs elevated and LUE positioned. Patient will benefit from intensive inpatient follow-up therapy, >3 hours/day due to gains with following directions. Acute OT to continue to follow to address established goals to facilitate DC to next venue of care.       If plan is discharge home, recommend the following:  Two people to help with walking and/or transfers;Two people to help with bathing/dressing/bathroom   Equipment Recommendations  None recommended by OT    Recommendations for Other Services      Precautions / Restrictions Precautions Precautions: Fall Recall of Precautions/Restrictions: Impaired Precaution/Restrictions Comments: L UE hemiparesis Restrictions LUE Weight Bearing Per Provider Order: Non weight bearing       Mobility Bed Mobility Overal bed mobility: Needs Assistance Bed Mobility: Supine to Sit     Supine to sit: Mod assist, HOB elevated     General bed mobility comments: assist with trunk and LUE, bed pad used to assist with scooting to EOB    Transfers Overall transfer level: Needs assistance Equipment used: None Transfers: Sit to/from Stand, Bed to chair/wheelchair/BSC Sit to Stand: Max assist Stand pivot transfers: Max assist         General  transfer comment: stand pivot transfer to recliner with face to face technique to allow for stability     Balance Overall balance assessment: Needs assistance Sitting-balance support: No upper extremity supported, Feet supported Sitting balance-Leahy Scale: Fair Sitting balance - Comments: EOB with supervision                                   ADL either performed or assessed with clinical judgement   ADL Overall ADL's : Needs assistance/impaired Eating/Feeding: Supervision/ safety;Sitting Eating/Feeding Details (indicate cue type and reason): cues to stay on tasks and setup provided Grooming: Wash/dry hands;Wash/dry face;Supervision/safety;Sitting Grooming Details (indicate cue type and reason): verbal cues to initiate                                    Extremity/Trunk Assessment              Vision       Perception     Praxis     Communication Communication Communication: No apparent difficulties   Cognition Arousal: Alert Behavior During Therapy: Lability Cognition: No family/caregiver present to determine baseline             OT - Cognition Comments: patient indecisive on wants/needs                 Following commands: Impaired Following commands impaired: Follows one step commands with increased time, Follows one step commands inconsistently  Cueing   Cueing Techniques: Verbal cues, Tactile cues, Gestural cues  Exercises      Shoulder Instructions       General Comments      Pertinent Vitals/ Pain       Pain Assessment Pain Assessment: Faces Faces Pain Scale: Hurts little more Pain Location: headache Pain Descriptors / Indicators: Grimacing, Headache, Discomfort Pain Intervention(s): Limited activity within patient's tolerance, Monitored during session, Premedicated before session  Home Living                                          Prior Functioning/Environment               Frequency  Min 1X/week        Progress Toward Goals  OT Goals(current goals can now be found in the care plan section)  Progress towards OT goals: Progressing toward goals  Acute Rehab OT Goals Patient Stated Goal: to rest OT Goal Formulation: Patient unable to participate in goal setting Time For Goal Achievement: 07/12/23 Potential to Achieve Goals: Good ADL Goals Pt Will Perform Eating: with modified independence;sitting Pt Will Perform Grooming: with set-up;sitting Pt Will Perform Upper Body Bathing: with set-up;sitting Pt Will Perform Lower Body Bathing: with min assist;sit to/from stand;sitting/lateral leans Pt Will Perform Lower Body Dressing: sitting/lateral leans;sit to/from stand;with supervision Pt Will Transfer to Toilet: with mod assist;stand pivot transfer;bedside commode Pt Will Perform Toileting - Clothing Manipulation and hygiene: with min assist;sit to/from stand;sitting/lateral leans Additional ADL Goal #1: pt will manage L UE with R UE to elevate and position safe position mod I  Plan      Co-evaluation                 AM-PAC OT "6 Clicks" Daily Activity     Outcome Measure   Help from another person eating meals?: A Little Help from another person taking care of personal grooming?: A Lot Help from another person toileting, which includes using toliet, bedpan, or urinal?: A Lot Help from another person bathing (including washing, rinsing, drying)?: A Lot Help from another person to put on and taking off regular upper body clothing?: A Lot Help from another person to put on and taking off regular lower body clothing?: A Lot 6 Click Score: 13    End of Session Equipment Utilized During Treatment: Gait belt  OT Visit Diagnosis: Unsteadiness on feet (R26.81);Other abnormalities of gait and mobility (R26.89);Muscle weakness (generalized) (M62.81)   Activity Tolerance Patient tolerated treatment well   Patient Left in chair;with call  bell/phone within reach;with chair alarm set   Nurse Communication Mobility status;Precautions        Time: 5784-6962 OT Time Calculation (min): 31 min  Charges: OT General Charges $OT Visit: 1 Visit OT Treatments $Self Care/Home Management : 23-37 mins  Alfonse Flavors, OTA Acute Rehabilitation Services  Office [phone]   Dewain Penning 06/23/2023, 3:05 PM

## 2023-06-23 NOTE — TOC Progression Note (Addendum)
Transition of Care [facility]) - Progression Note    Patient Details  Name: Lauren [name] MRN: [id number] Date of Birth: [birth year]  [facility]) CM/SW Contact  Astrid Drafts Berna Spare, RN Phone Number: 06/23/2023, 2:55pm  Clinical Narrative:    Patient was referred to [facility] for review, as insurance will not pay for SNF, and patient would likely benefit from IP rehab for strengthening and help with fall prevention. Per Molinda Bailiff admissions coordinator, medical director is in favor of admission pending insurance auth. Discussed this with patient's daughter, Al Decant, and she agrees that this is a good plan, as she feels it would benefit her mom.  She would like to proceed with insurance authorization.  Notified admissions coordinator for [facility]; she will initiate insurance auth and reach out to daughter.     Expected Discharge Plan: IP Rehab Facility Barriers to Discharge: Continued Medical Work up  Expected Discharge Plan and Services   Discharge Planning Services: CM Consult Post Acute Care Choice: IP Rehab Living arrangements for the past 2 months: Single Family Home                                       Social Determinants of Health (SDOH) Interventions SDOH Screenings   Food Insecurity: Patient Unable To Answer (06/19/2023)  Recent Concern: Food Insecurity - Food Insecurity Present (05/21/2023)  Housing: Patient Declined (06/19/2023)  Transportation Needs: No Transportation Needs (06/19/2023)  Recent Concern: Transportation Needs - Unmet Transportation Needs (05/18/2023)  Utilities: Not At Risk (06/19/2023)  Alcohol Screen: Low Risk  (11/09/2022)  Depression (PHQ2-9): Low Risk  (02/18/2023)  Recent Concern: Depression (PHQ2-9) - Medium Risk (02/04/2023)  Financial Resource Strain: Low Risk  (11/09/2022)  Physical Activity: Inactive (03/03/2022)  Social Connections: Socially Integrated (03/03/2022)  Stress: No Stress Concern Present  (03/03/2022)  Recent Concern: Stress - Stress Concern Present (12/22/2021)  Tobacco Use: High Risk (06/20/2023)    Readmission Risk Interventions    06/09/2023    3:19 PM 05/18/2023    1:36 PM 04/22/2023    3:05 PM  Readmission Risk Prevention Plan  Transportation Screening Complete Complete Complete  Medication Review Oceanographer) Complete Complete Complete  PCP or Specialist appointment within 3-5 days of discharge   Not Complete  HRI or Home Care Consult Complete Complete Complete  SW Recovery Care/Counseling Consult Complete Complete Complete  Palliative Care Screening Not Applicable Not Applicable Not Applicable  Skilled Nursing Facility Not Applicable Complete Not Applicable   Quintella Baton, RN, BSN  Trauma/Neuro ICU Case Manager [phone]

## 2023-06-23 NOTE — Progress Notes (Signed)
NAME:  Lauren [name], MRN:  [id number], DOB:  10/07/1980, LOS: 4 ADMISSION DATE:  06/19/2023, CONSULTATION DATE:  06/19/23 REFERRING MD:  Dr. Estell Harpin - EDP, CHIEF COMPLAINT:  [facility]   History of Present Illness:  Lauren [name] is a 43 year old female with a past medical history significant for CAD with prior MI, chronically anticoagulated with Coumadin CHF, CKD stage IV, diabetes, prior stroke 2023 and anemia who presented to the ED at [facility] 2/16 via EMS after suffering mechanical fall at home with head injury.  On ED arrival patient was seen with mild hypotension but hemodynamically stable.  Lab work significant for NA 134, glucose 237, creatinine 2.37, GFR 26, albumin 2.7, WBC 19.0, INR supratherapeutic at 7.8.  Given fall with concern of head injury head CT was obtained on arrival and revealed right acute subdural hematoma measuring 2.1 with 9 mm midline shift.  Neurosurgery consulted with recommendations made to transfer to [facility] for further care.  PCCM consulted for further management admission  Pertinent  Medical History  CAD with prior MI, chronically anticoagulated with Coumadin CHF, CKD stage IV, diabetes, prior stroke 2023 and anemia   Significant Hospital Events: Including procedures, antibiotic start and stop dates in addition to other pertinent events   2/16 presented to ED via EMS after suffering a fall at home with head injury.  Head CT with acute SDH with midline shift in the setting of supratherapeutic INR 2/17 right frontotemporal parietal craniotomy with evacuation of subdural hematoma  Interim History / Subjective:   Remains calm off dexmedetomidine  Objective   Blood pressure 121/87, pulse 91, temperature 99.5 F (37.5 C), temperature source Oral, resp. rate 12, height 5\' 5"  (1.651 m), weight 87.4 kg, last menstrual period 05/25/2023, SpO2 100%.        Intake/Output Summary (Last 24 hours) at 06/23/2023 1044 Last data filed at 06/23/2023 0600 Gross per 24 hour   Intake 720 ml  Output 600 ml  Net 120 ml   Filed Weights   06/21/23 0425 06/22/23 0717 06/23/23 0707  Weight: 77.2 kg 77.2 kg 87.4 kg    Examination: General: Lying in bed, in no distress. HEENT: Circumferential gauze wrap to head. Neuro: Awake and follows commands.  Interacts appropriately and answers questions clearly.  Chronic left-sided weakness unchanged. CV: Normal heart sounds.   PULM: Clear bilaterally GI: Soft nontender Extremities: Sutured lacerations on right leg and toe edges well opposed.  Ancillary tests personally reviewed:  Creatinine stable at 2.29. Hemoglobin has improved posttransfusion to 8.1.  Assessment & Plan:   Acute subdural hematoma with midline shift and brain compression Chronic heart failure with reduced ejection fraction history of coronary artery disease with prior MI Chronic kidney disease stage III Continue SSI for diabetes Hypothyroidism Dyslipidemia  Plan:  -Improving neurological examination.  Agitation controlled off infusions.  Should be ready to transfer to PCU. -Continue sliding scale insulin. -Continue to hold all anticoagulation at this time. -Restart home metoprolol XL, hydralazine and nitroglycerin as part of GDMT. -Currently appears euvolemic so will not restart diuretics at this time. -Continue home Topamax.  Hold other home psychotropic medications. -Continue current dose of quetiapine.  May be able to discontinue before discharge. -TOC for long-term placement.  Likely unsafe to go home due to high fall risk and supratherapeutic INR.  Best Practice (right click and "Reselect all SmartList Selections" daily)   Diet/type: NPO; slp eval today; patient/family refuses feeding tube DVT prophylaxis SCD Pressure ulcer(s): N/A GI prophylaxis: PPI Lines: N/A Foley:  N/A Code Status:  full code, family states she is okay with compressions and shocks but no intubation or feeding tubes Last date of multidisciplinary goals of care  discussion: 2/17 updated daughter and friend at bedside  Lynnell Catalan, MD [facility] ICU Physician [facility] [place] [facility]  Pager: [phone] Or Epic Secure Chat After hours: [phone].  06/23/2023, 10:57 AM

## 2023-06-23 NOTE — Progress Notes (Signed)
  Inpatient Rehab Admissions Coordinator :  Per therapy change in recommendations, patient was screened for CIR candidacy by Ottie Glazier RN MSN. Noted per TOC assessment ,that patient does not have 24/7 assist at home.Spouse and daughter only able to provide intermittent assist as they work. I would not expect patient to reach a level with CIR admit to reach Mod I to return home with intermittent assist. We will not pursue CIR admit at this time. Recommend other rehab venues to be pursued. Please contact me with any questions.  Ottie Glazier RN MSN Admissions Coordinator [phone]

## 2023-06-23 NOTE — Progress Notes (Signed)
Patient ID: Lauren [name], female   DOB: 30-Aug-1980, 43 y.o.   MRN: [id number] Patient appears clinically stable.  She is moving more and more talkative.  Dressing is intact.  We will follow clinically.

## 2023-06-23 NOTE — Progress Notes (Signed)
Physical Therapy Treatment Patient Details Name: Lauren [name] MRN: [id number] DOB: 04-28-1981 Today's Date: 06/23/2023   History of Present Illness 43 yo female s/p 2/17 R fronto parietal cranitomy for SDH PMh DM1, seizures, CVA, CKD stage 4, NSTEMI, recent left proximal humerus fx after fall 12/23. 1/14-1/25 L ama R LE SFA to AT bypass on 1/29    PT Comments  Pt up in chair upon PT arrival to room, had slid down into a sacral sitting position and pt endorsing being uncomfortable. Pt is labile during session, intermittently tearful and speaking tangentially at times. Pt requiring mod-max +2 assist to transfer out of chair back to bed, pt tolerating x2 stands but has max difficulty taking steps to get back to bed given LLE weakness. Patient will benefit from intensive inpatient follow-up therapy, >3 hours/day.    If plan is discharge home, recommend the following: Help with stairs or ramp for entrance;Assistance with cooking/housework;A lot of help with walking and/or transfers;Assist for transportation;A lot of help with bathing/dressing/bathroom   Can travel by private vehicle        Equipment Recommendations  Other (comment) (defer)    Recommendations for Other Services       Precautions / Restrictions Precautions Precautions: Fall Recall of Precautions/Restrictions: Impaired Precaution/Restrictions Comments: L UE hemiparesis     Mobility  Bed Mobility Overal bed mobility: Needs Assistance Bed Mobility: Sit to Supine       Sit to supine: Mod assist, +2 for physical assistance   General bed mobility comments: assist for trunk and LE management, cues for sequencing and assist for repositioning once returned to supine.    Transfers Overall transfer level: Needs assistance Equipment used: 2 person hand held assist Transfers: Sit to/from Stand, Bed to chair/wheelchair/BSC Sit to Stand: Mod assist, +2 physical assistance   Step pivot transfers: Max assist, +2 physical  assistance       General transfer comment: assist for power up, rise, steady, and stepping to get back to bed. Pt crossing LLE over R with no advancement of LLE during stepping    Ambulation/Gait                   Stairs             Wheelchair Mobility     Tilt Bed    Modified Rankin (Stroke Patients Only)       Balance Overall balance assessment: Needs assistance Sitting-balance support: No upper extremity supported, Feet supported Sitting balance-Leahy Scale: Fair     Standing balance support: Single extremity supported, During functional activity, Reliant on assistive device for balance Standing balance-Leahy Scale: Poor Standing balance comment: reliant on +2 assist                            Communication Communication Communication: No apparent difficulties  Cognition Arousal: Alert Behavior During Therapy: Lability   PT - Cognitive impairments: History of cognitive impairments, No family/caregiver present to determine baseline, Attention, Memory, Problem solving, Orientation   Orientation impairments: Time, Situation                   PT - Cognition Comments: repeatedly saying "hey! hey!" in session, appears to be talking to people who aren't in the room at times. labile, tearful intermittently and does not recall why she is in the hospital, states she is here because she's "just visiting" Following commands: Impaired Following commands impaired: Follows one step commands with  increased time, Follows one step commands inconsistently    Cueing Cueing Techniques: Verbal cues, Tactile cues, Gestural cues  Exercises      General Comments        Pertinent Vitals/Pain Pain Assessment Pain Assessment: Faces Faces Pain Scale: Hurts little more Pain Location: LEs Pain Descriptors / Indicators: Grimacing, Guarding, Discomfort Pain Intervention(s): Limited activity within patient's tolerance, Monitored during session,  Repositioned    Home Living                          Prior Function            PT Goals (current goals can now be found in the care plan section) Acute Rehab PT Goals PT Goal Formulation: With patient Time For Goal Achievement: 07/05/23 Potential to Achieve Goals: Good Progress towards PT goals: Progressing toward goals    Frequency    Min 1X/week      PT Plan      Co-evaluation              AM-PAC PT "6 Clicks" Mobility   Outcome Measure  Help needed turning from your back to your side while in a flat bed without using bedrails?: A Lot Help needed moving from lying on your back to sitting on the side of a flat bed without using bedrails?: A Lot Help needed moving to and from a bed to a chair (including a wheelchair)?: A Lot Help needed standing up from a chair using your arms ([name].g., wheelchair or bedside chair)?: Total Help needed to walk in hospital room?: Total Help needed climbing 3-5 steps with a railing? : Total 6 Click Score: 9    End of Session Equipment Utilized During Treatment: Gait belt Activity Tolerance: Treatment limited secondary to agitation;Patient limited by fatigue Patient left: with call bell/phone within reach;in bed;with bed alarm set Nurse Communication: Mobility status PT Visit Diagnosis: Other abnormalities of gait and mobility (R26.89);Pain;Other symptoms and signs involving the nervous system (R29.898)     Time: 4098-1191 PT Time Calculation (min) (ACUTE ONLY): 18 min  Charges:    $Therapeutic Activity: 8-22 mins PT General Charges $$ ACUTE PT VISIT: 1 Visit                     Lauren [name] Acute Rehabilitation Services Secure Chat Preferred  Office [phone]    Lauren [name] 06/23/2023, 2:13 PM

## 2023-06-24 DIAGNOSIS — S065XAA Traumatic subdural hemorrhage with loss of consciousness status unknown, initial encounter: Secondary | ICD-10-CM | POA: Diagnosis not present

## 2023-06-24 LAB — BPAM RBC
Blood Product Expiration Date: 202503202359
Blood Product Expiration Date: 202503212359
ISSUE DATE / TIME: 202502190852
Unit Type and Rh: 202503202359
Unit Type and Rh: 6200
Unit Type and Rh: 6200

## 2023-06-24 LAB — TYPE AND SCREEN
ABO/RH(D): A POS
Antibody Screen: POSITIVE
Donor AG Type: NEGATIVE
Donor AG Type: NEGATIVE
Unit division: 0
Unit division: 0

## 2023-06-24 LAB — CBC WITH DIFFERENTIAL/PLATELET
Abs Immature Granulocytes: 0.06 10*3/uL (ref 0.00–0.07)
Basophils Absolute: 0 10*3/uL (ref 0.0–0.1)
Basophils Relative: 0 %
Eosinophils Absolute: 0.4 10*3/uL (ref 0.0–0.5)
Eosinophils Relative: 4 %
HCT: 25.4 % — ABNORMAL LOW (ref 36.0–46.0)
Hemoglobin: 8 g/dL — ABNORMAL LOW (ref 12.0–15.0)
Immature Granulocytes: 1 %
Lymphocytes Relative: 13 %
Lymphs Abs: 1.4 10*3/uL (ref 0.7–4.0)
MCH: 27.8 pg (ref 26.0–34.0)
MCHC: 31.5 g/dL (ref 30.0–36.0)
MCV: 88.2 fL (ref 80.0–100.0)
Monocytes Absolute: 0.8 10*3/uL (ref 0.1–1.0)
Monocytes Relative: 8 %
Neutro Abs: 7.9 10*3/uL — ABNORMAL HIGH (ref 1.7–7.7)
Neutrophils Relative %: 74 %
Platelets: 296 10*3/uL (ref 150–400)
RBC: 2.88 MIL/uL — ABNORMAL LOW (ref 3.87–5.11)
RDW: 19.5 % — ABNORMAL HIGH (ref 11.5–15.5)
WBC: 10.5 10*3/uL (ref 4.0–10.5)
nRBC: 0 % (ref 0.0–0.2)

## 2023-06-24 LAB — BASIC METABOLIC PANEL
Anion gap: 7 (ref 5–15)
BUN: 15 mg/dL (ref 6–20)
CO2: 21 mmol/L — ABNORMAL LOW (ref 22–32)
Calcium: 7.6 mg/dL — ABNORMAL LOW (ref 8.9–10.3)
Chloride: 112 mmol/L — ABNORMAL HIGH (ref 98–111)
Creatinine, Ser: 1.82 mg/dL — ABNORMAL HIGH (ref 0.44–1.00)
GFR, Estimated: 36 mL/min — ABNORMAL LOW (ref >60)
Glucose, Bld: 89 mg/dL (ref 70–99)
Potassium: 3.8 mmol/L (ref 3.5–5.1)
Sodium: 140 mmol/L (ref 135–145)

## 2023-06-24 LAB — GLUCOSE, CAPILLARY
Glucose-Capillary: 84 mg/dL (ref 70–99)
Glucose-Capillary: 89 mg/dL (ref 70–99)
Glucose-Capillary: 47 mg/dL — ABNORMAL LOW (ref 70–99)
Glucose-Capillary: 55 mg/dL — ABNORMAL LOW (ref 70–99)
Glucose-Capillary: 63 mg/dL — ABNORMAL LOW (ref 70–99)
Glucose-Capillary: 76 mg/dL (ref 70–99)
Glucose-Capillary: 121 mg/dL — ABNORMAL HIGH (ref 70–99)

## 2023-06-24 MED ORDER — DEXTROSE 50 % IV SOLN
INTRAVENOUS | Status: AC
Start: 2023-06-24 — End: 2023-06-25
  Filled 2023-06-24: qty 50

## 2023-06-24 MED ORDER — BISACODYL 10 MG RE SUPP
10.0000 mg | Freq: Every day | RECTAL | Status: DC | PRN
  Filled 2023-06-24: qty 1

## 2023-06-24 NOTE — Progress Notes (Signed)
Speech Language Pathology Treatment: Cognitive-Linquistic  Patient Details Name: Lauren [name] MRN: [id number] DOB: [birth year] Today's Date: 06/24/2023 Time: 4098-1191 SLP Time Calculation (min) (ACUTE ONLY): 10 min  Assessment / Plan / Recommendation Clinical Impression  Attempted tp see pt with solid foods after diet advancement, but she refused all POs offered except for one sip of water, which was consumed without incidence. Session otherwise focused on cognition with Mod cues given for sustained attention to task and conversation, to give appropriate responses that were on topic. She is oriented x4 but needs Mod-Max cues for reasoning and awareness. She will continue to benefit from SLP at next level of care.    HPI HPI: 43 yo pt admitted 2/16 after mechanical fall with head injury. CT Head showed R SDH with 9mm midline shift s/p evacuation 2/17. Pt known to this SLP team, seen most recently in December 2024 for swallow eval (normal appearing swallow, regular solids/thin liquids recommended) and January 2025 for speech/language eval (mild deficits related to L side attention, awareness, and memory; independent with finances but receives assistance with medication management). PMH includes: CAD with prior MI, chronically anticoagulated with Coumadin CHF, CKD stage IV, diabetes, prior stroke 2023 (residual L weakness) and anemia      SLP Plan  Continue with current plan of care      Recommendations for follow up therapy are one component of a multi-disciplinary discharge planning process, led by the attending physician.  Recommendations may be updated based on patient status, additional functional criteria and insurance authorization.    Recommendations  Diet recommendations: Dysphagia 3 (mechanical soft);Thin liquid Liquids provided via: Cup;Straw Medication Administration: Whole meds with puree Supervision: Patient able to self feed;Full supervision/cueing for compensatory  strategies Compensations: Minimize environmental distractions;Slow rate;Small sips/bites;Follow solids with liquid Postural Changes and/or Swallow Maneuvers: Seated upright 90 degrees                  Oral care BID   Frequent or constant Supervision/Assistance Cognitive communication deficit (Y78.295)     Continue with current plan of care      Mahala Menghini., M.A. CCC-SLP Acute Rehabilitation Services Office [phone]  Secure chat preferred   06/24/2023, 11:38 AM

## 2023-06-24 NOTE — Progress Notes (Signed)
  Progress Note   Patient: Lauren [name] ZOX:[id number] DOB: 13-Nov-1980 DOA: 06/19/2023     5 DOS: the patient was seen and examined on 06/24/2023   Brief hospital course: 43 year old female with a past medical history significant for CAD with prior MI, chronically anticoagulated with Coumadin CHF, CKD stage IV, diabetes, prior stroke 2023 and anemia who presented to the ED at [facility] 2/16 via EMS after suffering mechanical fall at home with head injury. On ED arrival patient was seen with mild hypotension but hemodynamically stable. Lab work significant for NA 134, glucose 237, creatinine 2.37, GFR 26, albumin 2.7, WBC 19.0, INR supratherapeutic at 7.8. Given fall with concern of head injury head CT was obtained on arrival and revealed right acute subdural hematoma measuring 2.1 with 9 mm midline shift. Neurosurgery consulted with recommendations made to transfer to [facility] for further care. PCCM consulted for further management admission   Assessment and Plan: Acute subdural hematoma with midline shift and brain compression -Acute right mixed density subdural hematoma with cerebral loculation suggesting some chronic features -Coumadin reversed in the ED -Pt alert, somewhat conversant today History of seizure disorder History of prior CVA with left-sided residual weakness   -Neuroprotective measures   Limit sedating medications   Chronic heart failure with reduced ejection fraction history of coronary artery disease with prior MI On Toprol-XL -appears euvolemic at this   Chronic kidney disease stage III -Cont to avoid nephrotoxic medications -Cr is trending down   Diabetes Hypoglycemic today -hold long-acting. Cont on SSI alone   Hypothyroidism -Continue Synthroid -TSH 11.621 -Will check free T4   Continue statins         Subjective: Difficult to assess given deficits  Physical Exam: Vitals:   06/24/23 0733 06/24/23 0905 06/24/23 1123 06/24/23 1504  BP: 124/84  132/70 131/85 135/78  Pulse:  98    Resp: 14  13 20   Temp: 98.8 F (37.1 C)  98.4 F (36.9 C) 98.5 F (36.9 C)  TempSrc: Oral  Axillary Oral  SpO2: 100%  100% 100%  Weight:      Height:       General exam: Awake, laying in bed, in nad Respiratory system: Normal respiratory effort, no wheezing Cardiovascular system: regular rate, s1, s2 Gastrointestinal system: Soft, nondistended, positive BS Central nervous system: CN2-12 grossly intact, strength intact Extremities: Perfused, no clubbing Skin: Normal skin turgor, no notable skin lesions seen Psychiatry: Mood normal // no visual hallucinations   Data Reviewed:  Labs reviewed: Na 140, K 3.8, Cr 1.82, WBC 10.5, Hgb 8.0, Plts 296  Family Communication: Pt in room, family at bedside  Disposition: Status is: Inpatient Remains inpatient appropriate because: severity of illness  Planned Discharge Destination: Rehab    Author: Rickey Barbara, MD 06/24/2023 5:26 PM  For on call review [email or web address].

## 2023-06-24 NOTE — Progress Notes (Addendum)
Hypoglycemic Event  1638 CBG: 55  Treatment: 4 oz juice   Symptoms: None  Follow-up CBG: Time:1657 CBG Result:47  Possible Reasons for Event: Inadequate meal intake    Treatment: 8 oz juice/soda   Follow-up CBG time: 1715  CBG result: 63   Treatment 8 oz juice/soda   Follow-up CBG time: 1750 CBG result: 23  Dr. Rhona Leavens made aware. See new order d/c long acting insulin.         Lauren [name]

## 2023-06-24 NOTE — Hospital Course (Signed)
43 year old female with a past medical history significant for CAD with prior MI, chronically anticoagulated with Coumadin CHF, CKD stage IV, diabetes, prior stroke 2023 and anemia who presented to the ED at [facility] 2/16 via EMS after suffering mechanical fall at home with head injury. On ED arrival patient was seen with mild hypotension but hemodynamically stable. Lab work significant for NA 134, glucose 237, creatinine 2.37, GFR 26, albumin 2.7, WBC 19.0, INR supratherapeutic at 7.8. Given fall with concern of head injury head CT was obtained on arrival and revealed right acute subdural hematoma measuring 2.1 with 9 mm midline shift. Neurosurgery consulted with recommendations made to transfer to [facility] for further care. PCCM consulted for further management admission

## 2023-06-25 DIAGNOSIS — S065XAA Traumatic subdural hemorrhage with loss of consciousness status unknown, initial encounter: Secondary | ICD-10-CM | POA: Diagnosis not present

## 2023-06-25 LAB — GLUCOSE, CAPILLARY
Glucose-Capillary: 61 mg/dL — ABNORMAL LOW (ref 70–99)
Glucose-Capillary: 76 mg/dL (ref 70–99)
Glucose-Capillary: 101 mg/dL — ABNORMAL HIGH (ref 70–99)
Glucose-Capillary: 85 mg/dL (ref 70–99)
Glucose-Capillary: 72 mg/dL (ref 70–99)

## 2023-06-25 LAB — COMPREHENSIVE METABOLIC PANEL
ALT: 9 U/L (ref 0–44)
AST: 13 U/L — ABNORMAL LOW (ref 15–41)
Albumin: 1.7 g/dL — ABNORMAL LOW (ref 3.5–5.0)
Alkaline Phosphatase: 93 U/L (ref 38–126)
Anion gap: 15 (ref 5–15)
BUN: 13 mg/dL (ref 6–20)
CO2: 21 mmol/L — ABNORMAL LOW (ref 22–32)
Calcium: 8.4 mg/dL — ABNORMAL LOW (ref 8.9–10.3)
Chloride: 106 mmol/L (ref 98–111)
Creatinine, Ser: 1.54 mg/dL — ABNORMAL HIGH (ref 0.44–1.00)
GFR, Estimated: 44 mL/min — ABNORMAL LOW (ref >60)
Glucose, Bld: 100 mg/dL — ABNORMAL HIGH (ref 70–99)
Potassium: 3.9 mmol/L (ref 3.5–5.1)
Sodium: 142 mmol/L (ref 135–145)
Total Bilirubin: 0.7 mg/dL (ref 0.0–1.2)
Total Protein: 5 g/dL — ABNORMAL LOW (ref 6.5–8.1)

## 2023-06-25 MED ORDER — INSULIN ASPART 100 UNIT/ML IJ SOLN: 0.0000 [IU] | Freq: Every day | INTRAMUSCULAR | Status: DC

## 2023-06-25 MED ORDER — INSULIN ASPART 100 UNIT/ML IJ SOLN
0.0000 [IU] | Freq: Three times a day (TID) | INTRAMUSCULAR | Status: DC
Start: 2023-06-25 — End: 2023-06-29
  Administered 2023-06-26: 1 [IU] via SUBCUTANEOUS
  Administered 2023-06-26 (×2): 2 [IU] via SUBCUTANEOUS
  Administered 2023-06-27: 5 [IU] via SUBCUTANEOUS
  Administered 2023-06-27: 9 [IU] via SUBCUTANEOUS
  Administered 2023-06-28: 2 [IU] via SUBCUTANEOUS
  Administered 2023-06-28: 9 [IU] via SUBCUTANEOUS
  Administered 2023-06-29: 2 [IU] via SUBCUTANEOUS

## 2023-06-25 NOTE — Progress Notes (Signed)
  Progress Note   Patient: Lauren [name] ZOX:[id number] DOB: June 15, 1980 DOA: 06/19/2023     6 DOS: the patient was seen and examined on 06/25/2023   Brief hospital course: 43 year old female with a past medical history significant for CAD with prior MI, chronically anticoagulated with Coumadin CHF, CKD stage IV, diabetes, prior stroke 2023 and anemia who presented to the ED at [facility] 2/16 via EMS after suffering mechanical fall at home with head injury. On ED arrival patient was seen with mild hypotension but hemodynamically stable. Lab work significant for NA 134, glucose 237, creatinine 2.37, GFR 26, albumin 2.7, WBC 19.0, INR supratherapeutic at 7.8. Given fall with concern of head injury head CT was obtained on arrival and revealed right acute subdural hematoma measuring 2.1 with 9 mm midline shift. Neurosurgery consulted with recommendations made to transfer to [facility] for further care. PCCM consulted for further management admission   Assessment and Plan: Acute subdural hematoma with midline shift and brain compression -Acute right mixed density subdural hematoma with cerebral loculation suggesting some chronic features -Coumadin reversed in the ED -Pt alert, somewhat conversant today History of seizure disorder History of prior CVA with left-sided residual weakness   -Neuroprotective measures   Limit sedating medications   Chronic heart failure with reduced ejection fraction history of coronary artery disease with prior MI On Toprol-XL -appears euvolemic at this   Chronic kidney disease stage III -Cont to avoid nephrotoxic medications -Cr is trending down   Diabetes Hypoglycemic recently -holding long-acting insulin -changed SSI to sensitive scale   Hypothyroidism -Continue Synthroid -TSH 11.621 -Will check free T4   Continue statins  Hypoalbuminemia      Subjective: Difficult to assess given pt's deficits  Physical Exam: Vitals:   06/25/23 0743 06/25/23  0859 06/25/23 0900 06/25/23 1200  BP: 126/75 135/76  137/82  Pulse:  95    Resp: 11  17 11   Temp: 98.5 F (36.9 C)   98.9 F (37.2 C)  TempSrc: Oral   Oral  SpO2: 100%  100% 100%  Weight:      Height:       General exam: Conversant, in no acute distress Respiratory system: normal chest rise, clear, no audible wheezing Cardiovascular system: regular rhythm, s1-s2 Gastrointestinal system: Nondistended, nontender, pos BS Central nervous system: No seizures, no tremors Extremities: No cyanosis, no joint deformities Skin: No rashes, no pallor Psychiatry: Affect normal // no auditory hallucinations   Data Reviewed:  Labs reviewed: Na 142, K 3.9, Cr 1.54, Alb 1.7  Family Communication: Pt in room, family at bedside  Disposition: Status is: Inpatient Remains inpatient appropriate because: severity of illness  Planned Discharge Destination: Rehab    Author: Rickey Barbara, MD 06/25/2023 3:13 PM  For on call review [email or web address].

## 2023-06-26 DIAGNOSIS — S065XAA Traumatic subdural hemorrhage with loss of consciousness status unknown, initial encounter: Secondary | ICD-10-CM | POA: Diagnosis not present

## 2023-06-26 LAB — COMPREHENSIVE METABOLIC PANEL
ALT: <5 U/L (ref 0–44)
AST: 23 U/L (ref 15–41)
Albumin: 1.7 g/dL — ABNORMAL LOW (ref 3.5–5.0)
Alkaline Phosphatase: 107 U/L (ref 38–126)
Anion gap: 8 (ref 5–15)
BUN: 12 mg/dL (ref 6–20)
CO2: 20 mmol/L — ABNORMAL LOW (ref 22–32)
Calcium: 7.9 mg/dL — ABNORMAL LOW (ref 8.9–10.3)
Chloride: 109 mmol/L (ref 98–111)
Creatinine, Ser: 1.55 mg/dL — ABNORMAL HIGH (ref 0.44–1.00)
GFR, Estimated: 43 mL/min — ABNORMAL LOW (ref >60)
Glucose, Bld: 149 mg/dL — ABNORMAL HIGH (ref 70–99)
Potassium: 4.8 mmol/L (ref 3.5–5.1)
Sodium: 137 mmol/L (ref 135–145)
Total Bilirubin: 1.1 mg/dL (ref 0.0–1.2)
Total Protein: 5.3 g/dL — ABNORMAL LOW (ref 6.5–8.1)

## 2023-06-26 LAB — GLUCOSE, CAPILLARY
Glucose-Capillary: 191 mg/dL — ABNORMAL HIGH (ref 70–99)
Glucose-Capillary: 208 mg/dL — ABNORMAL HIGH (ref 70–99)
Glucose-Capillary: 129 mg/dL — ABNORMAL HIGH (ref 70–99)
Glucose-Capillary: 166 mg/dL — ABNORMAL HIGH (ref 70–99)
Glucose-Capillary: 115 mg/dL — ABNORMAL HIGH (ref 70–99)

## 2023-06-26 LAB — T4, FREE: Free T4: 1.1 ng/dL (ref 0.61–1.12)

## 2023-06-26 NOTE — Progress Notes (Signed)
  Progress Note   Patient: Lauren [name] WJX:[id number] DOB: 1980/12/01 DOA: 06/19/2023     7 DOS: the patient was seen and examined on 06/26/2023   Brief hospital course: 43 year old female with a past medical history significant for CAD with prior MI, chronically anticoagulated with Coumadin CHF, CKD stage IV, diabetes, prior stroke 2023 and anemia who presented to the ED at [facility] 2/16 via EMS after suffering mechanical fall at home with head injury. On ED arrival patient was seen with mild hypotension but hemodynamically stable. Lab work significant for NA 134, glucose 237, creatinine 2.37, GFR 26, albumin 2.7, WBC 19.0, INR supratherapeutic at 7.8. Given fall with concern of head injury head CT was obtained on arrival and revealed right acute subdural hematoma measuring 2.1 with 9 mm midline shift. Neurosurgery consulted with recommendations made to transfer to [facility] for further care. PCCM consulted for further management admission   Assessment and Plan: Acute subdural hematoma with midline shift and brain compression -Acute right mixed density subdural hematoma with cerebral loculation suggesting some chronic features -Coumadin reversed in the ED History of seizure disorder History of prior CVA with left-sided residual weakness -remains alert, conversant   -Neuroprotective measures   Limit sedating medications   Chronic heart failure with reduced ejection fraction history of coronary artery disease with prior MI On Toprol-XL -appears euvolemic at this   Chronic kidney disease stage III -Cont to avoid nephrotoxic medications -Cr is stable this aM   Diabetes -recent hypoglycemia -stopped long-acting insulin -cont sensitive scale SSI as needed   Hypothyroidism -Continue Synthroid -TSH 11.621 -Will check free T4   Continue statins  Hypoalbuminemia      Subjective: Without complaints today. Does not feel very hungry this AM  Physical Exam: Vitals:   06/26/23  0500 06/26/23 0800 06/26/23 0848 06/26/23 1200  BP:  136/85 132/77 130/76  Pulse:   (!) 107   Resp:  16 13 13   Temp:   98.3 F (36.8 C)   TempSrc:   Oral   SpO2:  99% 100% 99%  Weight: 79 kg     Height:       General exam: Awake, laying in bed, in nad Respiratory system: Normal respiratory effort, no wheezing Cardiovascular system: regular rate, s1, s2 Gastrointestinal system: Soft, nondistended, positive BS Central nervous system: CN2-12 grossly intact, strength intact Extremities: Perfused, no clubbing Skin: Normal skin turgor, no notable skin lesions seen Psychiatry: Mood normal // no visual hallucinations   Data Reviewed:  Labs reviewed: Na 137, K 4.8, Cr 1.55  Family Communication: Pt in room, family at bedside  Disposition: Status is: Inpatient Remains inpatient appropriate because: severity of illness  Planned Discharge Destination: Rehab    Author: Rickey Barbara, MD 06/26/2023 2:16 PM  For on call review [email or web address].

## 2023-06-27 DIAGNOSIS — S065XAA Traumatic subdural hemorrhage with loss of consciousness status unknown, initial encounter: Secondary | ICD-10-CM | POA: Diagnosis not present

## 2023-06-27 LAB — COMPREHENSIVE METABOLIC PANEL
ALT: 8 U/L (ref 0–44)
AST: 22 U/L (ref 15–41)
Albumin: 1.9 g/dL — ABNORMAL LOW (ref 3.5–5.0)
Alkaline Phosphatase: 112 U/L (ref 38–126)
Anion gap: 13 (ref 5–15)
BUN: 16 mg/dL (ref 6–20)
CO2: 17 mmol/L — ABNORMAL LOW (ref 22–32)
Calcium: 8.2 mg/dL — ABNORMAL LOW (ref 8.9–10.3)
Chloride: 108 mmol/L (ref 98–111)
Creatinine, Ser: 2.01 mg/dL — ABNORMAL HIGH (ref 0.44–1.00)
GFR, Estimated: 31 mL/min — ABNORMAL LOW (ref >60)
Glucose, Bld: 350 mg/dL — ABNORMAL HIGH (ref 70–99)
Potassium: 5.2 mmol/L — ABNORMAL HIGH (ref 3.5–5.1)
Sodium: 138 mmol/L (ref 135–145)
Total Bilirubin: 1.6 mg/dL — ABNORMAL HIGH (ref 0.0–1.2)
Total Protein: 5.8 g/dL — ABNORMAL LOW (ref 6.5–8.1)

## 2023-06-27 LAB — GLUCOSE, CAPILLARY
Glucose-Capillary: 378 mg/dL — ABNORMAL HIGH (ref 70–99)
Glucose-Capillary: 403 mg/dL — ABNORMAL HIGH (ref 70–99)
Glucose-Capillary: 261 mg/dL — ABNORMAL HIGH (ref 70–99)
Glucose-Capillary: 98 mg/dL (ref 70–99)
Glucose-Capillary: 163 mg/dL — ABNORMAL HIGH (ref 70–99)

## 2023-06-27 MED ORDER — SODIUM CHLORIDE 0.9 % IV SOLN: INTRAVENOUS | Status: AC

## 2023-06-27 MED ORDER — THIAMINE MONONITRATE 100 MG PO TABS
100.0000 mg | ORAL_TABLET | Freq: Every day | ORAL | Status: DC
  Administered 2023-06-27 – 2023-06-29 (×3): 100 mg via ORAL
  Filled 2023-06-27 (×3): qty 1

## 2023-06-27 MED ORDER — ALBUMIN HUMAN 25 % IV SOLN
25.0000 g | Freq: Once | INTRAVENOUS | Status: AC
  Administered 2023-06-27: 25 g via INTRAVENOUS
  Filled 2023-06-27: qty 100

## 2023-06-27 MED ORDER — INSULIN GLARGINE 100 UNIT/ML ~~LOC~~ SOLN
8.0000 [IU] | SUBCUTANEOUS | Status: DC
  Administered 2023-06-27 – 2023-06-28 (×2): 8 [IU] via SUBCUTANEOUS
  Filled 2023-06-27 (×3): qty 0.08

## 2023-06-27 MED ORDER — RENA-VITE PO TABS
1.0000 | ORAL_TABLET | Freq: Every day | ORAL | Status: DC
  Administered 2023-06-27 – 2023-06-28 (×2): 1 via ORAL
  Filled 2023-06-27 (×2): qty 1

## 2023-06-27 MED ORDER — SODIUM ZIRCONIUM CYCLOSILICATE 10 G PO PACK
10.0000 g | PACK | Freq: Once | ORAL | Status: AC
  Administered 2023-06-27: 10 g via ORAL
  Filled 2023-06-27: qty 1

## 2023-06-27 MED ORDER — PROSOURCE PLUS PO LIQD
30.0000 mL | Freq: Two times a day (BID) | ORAL | Status: DC
  Administered 2023-06-27 – 2023-06-29 (×5): 30 mL via ORAL
  Filled 2023-06-27 (×6): qty 30

## 2023-06-27 MED ORDER — INSULIN GLARGINE-YFGN 100 UNIT/ML ~~LOC~~ SOLN
8.0000 [IU] | SUBCUTANEOUS | Status: DC
  Filled 2023-06-27: qty 0.08

## 2023-06-27 MED ORDER — INSULIN GLARGINE-YFGN 100 UNIT/ML ~~LOC~~ SOLN: 8.0000 [IU] | SUBCUTANEOUS | Status: DC

## 2023-06-27 NOTE — Progress Notes (Signed)
  Progress Note   Patient: Lauren [name] WUJ:[id number] DOB: February 25, 1981 DOA: 06/19/2023     8 DOS: the patient was seen and examined on 06/27/2023   Brief hospital course: 43 year old female with a past medical history significant for CAD with prior MI, chronically anticoagulated with Coumadin CHF, CKD stage IV, diabetes, prior stroke 2023 and anemia who presented to the ED at [facility] 2/16 via EMS after suffering mechanical fall at home with head injury. On ED arrival patient was seen with mild hypotension but hemodynamically stable. Lab work significant for NA 134, glucose 237, creatinine 2.37, GFR 26, albumin 2.7, WBC 19.0, INR supratherapeutic at 7.8. Given fall with concern of head injury head CT was obtained on arrival and revealed right acute subdural hematoma measuring 2.1 with 9 mm midline shift. Neurosurgery consulted with recommendations made to transfer to [facility] for further care. PCCM consulted for further management admission   Assessment and Plan: Acute subdural hematoma with midline shift and brain compression -Acute right mixed density subdural hematoma with cerebral loculation suggesting some chronic features -Coumadin reversed in the ED History of seizure disorder History of prior CVA with left-sided residual weakness -remains alert, conversant   -Neuroprotective measures  Cont to limit sedating medications   Chronic heart failure with reduced ejection fraction history of coronary artery disease with prior MI On Toprol-XL -appears dehydrated today, cont basal IVF   Chronic kidney disease stage III -Cont to avoid nephrotoxic medications -Cr is trending up, suspect related to dehydration -Continuing on basal IVF   Diabetes -recent hypoglycemia, now hyperglycemic -Will resume 8 units semglee per diabetic coordinator -cont sensitive scale SSI as needed   Hypothyroidism -Continue Synthroid -TSH 11.621   Continue statins  Hypoalbuminemia  Severe protein  calorie malnutrition -Discussed with dietitian. Pt is not taking in adequate PO -Dietary supplements recommended -Possible need for cortrak as pt is not taking PO  Hyperkalemia -given lokelma      Subjective: Still not eating adequately  Physical Exam: Vitals:   06/26/23 2014 06/27/23 0701 06/27/23 0738 06/27/23 1135  BP: (!) 111/52  112/85 123/76  Pulse: 95  (!) 110   Resp: 14  16 16   Temp: 98.7 F (37.1 C)  98.6 F (37 C) (!) 97.4 F (36.3 C)  TempSrc: Oral  Oral Oral  SpO2: 98%  97%   Weight:  77.4 kg    Height:       General exam: Conversant, in no acute distress Respiratory system: normal chest rise, clear, no audible wheezing Cardiovascular system: regular rhythm, s1-s2 Gastrointestinal system: Nondistended, nontender, pos BS Central nervous system: No seizures, no tremors Extremities: No cyanosis, no joint deformities Skin: No rashes, no pallor Psychiatry: Affect normal // no auditory hallucinations   Data Reviewed:  Labs reviewed: Na 138, K 5.2, Cr 2.01, albumin 1.9  Family Communication: Pt in room, family not at bedside  Disposition: Status is: Inpatient Remains inpatient appropriate because: severity of illness  Planned Discharge Destination: Rehab    Author: Rickey Barbara, MD 06/27/2023 3:54 PM  For on call review [email or web address].

## 2023-06-27 NOTE — Progress Notes (Signed)
 Physical Therapy Treatment Patient Details Name: Lauren [name] MRN: [id number] DOB: December 19, 1980 Today's Date: 06/27/2023   History of Present Illness 43 yo female s/p 2/17 R fronto parietal cranitomy for SDH PMh DM1, seizures, CVA, CKD stage 4, NSTEMI, recent left proximal humerus fx after fall 12/23. 1/14-1/25 L ama R LE SFA to AT bypass on 1/29    PT Comments  Pt sleeping upon arrival to room, wakes with verbal stimulation and encouragement. Pt intermittently tearful and moaning during session, unable to localize area of pain or discomfort. Pt perseverative with response of "[place]" to subjective questions, difficult to redirect pt. Pt overall requiring up to max assist for bed mobility, but once EOB pt supervision for safety. Pt engaged in ADL task at EOB but requires max prompting. Pt requesting return to supine, PT to continue to folow and will progress pt as able.      If plan is discharge home, recommend the following: Help with stairs or ramp for entrance;Assistance with cooking/housework;A lot of help with walking and/or transfers;Assist for transportation;A lot of help with bathing/dressing/bathroom   Can travel by private vehicle        Equipment Recommendations  Other (comment) (defer to next venue)    Recommendations for Other Services       Precautions / Restrictions Precautions Precautions: Fall Recall of Precautions/Restrictions: Impaired Precaution/Restrictions Comments: L hemiparesis Restrictions Weight Bearing Restrictions Per Provider Order: Yes LUE Weight Bearing Per Provider Order: Non weight bearing RLE Weight Bearing Per Provider Order: Weight bearing as tolerated     Mobility  Bed Mobility Overal bed mobility: Needs Assistance Bed Mobility: Supine to Sit     Supine to sit: Mod assist Sit to supine: Max assist   General bed mobility comments: assist for all aspects, pt initating trunk roll towards R and LE translation off EOB.    Transfers                    General transfer comment: pt declines    Ambulation/Gait                   Stairs             Wheelchair Mobility     Tilt Bed    Modified Rankin (Stroke Patients Only)       Balance Overall balance assessment: Needs assistance Sitting-balance support: No upper extremity supported, Feet supported Sitting balance-Leahy Scale: Fair Sitting balance - Comments: EOB with supervision                                    Communication Communication Communication: No apparent difficulties  Cognition Arousal: Alert Behavior During Therapy: Lability   PT - Cognitive impairments: History of cognitive impairments, No family/caregiver present to determine baseline, Attention, Memory, Problem solving, Orientation   Orientation impairments: Time, Situation                   PT - Cognition Comments: pt perseverative on answering "" to any and all questions. Also states "ow" several times but when asked if she was in pain pt states no. pt crying intermittently during session Following commands: Impaired Following commands impaired: Follows one step commands with increased time, Follows one step commands inconsistently    Cueing Cueing Techniques: Verbal cues, Tactile cues, Gestural cues  Exercises      General Comments General comments (skin integrity, edema,  etc.): assisted pt in LB lotion donning to encourage attention for sitting EOB      Pertinent Vitals/Pain Pain Assessment Pain Assessment: Faces Faces Pain Scale: Hurts even more Pain Location: cannot state Pain Descriptors / Indicators: Grimacing, Discomfort, Moaning Pain Intervention(s): Monitored during session, Limited activity within patient's tolerance, Repositioned    Home Living                          Prior Function            PT Goals (current goals can now be found in the care plan section) Acute Rehab PT Goals PT Goal  Formulation: With patient Time For Goal Achievement: 07/05/23 Potential to Achieve Goals: Good Progress towards PT goals: Progressing toward goals    Frequency    Min 1X/week      PT Plan      Co-evaluation              AM-PAC PT "6 Clicks" Mobility   Outcome Measure  Help needed turning from your back to your side while in a flat bed without using bedrails?: A Lot Help needed moving from lying on your back to sitting on the side of a flat bed without using bedrails?: A Lot Help needed moving to and from a bed to a chair (including a wheelchair)?: Total Help needed standing up from a chair using your arms (e.g., wheelchair or bedside chair)?: Total Help needed to walk in hospital room?: Total Help needed climbing 3-5 steps with a railing? : Total 6 Click Score: 8    End of Session   Activity Tolerance: Patient limited by fatigue;Patient limited by pain Patient left: with call bell/phone within reach;in bed;with bed alarm set Nurse Communication: Mobility status PT Visit Diagnosis: Other abnormalities of gait and mobility (R26.89);Pain;Other symptoms and signs involving the nervous system (R29.898) Pain - Right/Left: Right Pain - part of body: Leg     Time: 1610-9604 PT Time Calculation (min) (ACUTE ONLY): 18 min  Charges:    $Therapeutic Activity: 8-22 mins PT General Charges $$ ACUTE PT VISIT: 1 Visit                       Marye Round, PT DPT Acute Rehabilitation Services Secure Chat Preferred  Office [phone]    Michaella Imai E Stroup 06/27/2023, 5:00 PM

## 2023-06-27 NOTE — Progress Notes (Signed)
 Initial Nutrition Assessment  DOCUMENTATION CODES:   Severe malnutrition in context of acute illness/injury  INTERVENTION:   House trays until AMS gets better, meals with feeding assistance  Magic cup TID with meals, each supplement provides 290 kcal and 9 grams of protein Ensure Enlive po BID, each supplement provides 350 kcal and 20 grams of protein. Prosource Plus BID, each supplement provides 100 kcal and 15 gm protein   Rnea-vite daily  100 mg Thiamine daily  Recommend adding 1000 units vitamin D daily  Patient has had minimal nutrition for 8 days now with no interest in POs recommend nutrition support if within GOC  NUTRITION DIAGNOSIS:   Severe Malnutrition related to acute illness as evidenced by moderate muscle depletion, mild fat depletion, energy intake < or equal to 50% for > or equal to 5 days.   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   2/16 - Admitted to ICU  2/16 - Head CT: acute SDH with midline shift in the setting of supratherapeutic INR  2/20 - Transferred to Progressive care   Patient lying in fetal position during visit, unable to provide nutritional history, Exam limited due to postioning. No family at bedside. History obtained through chart review.   Patient noted to have stroke in 2023. Patient last seen by Dietitian in 07/2022. Where she was admitted for Left toe amputation. Patient endorses good PO intake at that time. Weight documented was 88.5 kg. Since then she has had multiple  readmissions throughout the year. Reviewed weight history and patient has lost 40 lbs in 1 month, 20% weight loss. Question if weights are accurate as patient has chronic CHF which may be causing fluid gain. Since 07/2022 she has lost 26 lbs, 13% weight loss.   Spoke to RN who reports she has had minimal intake since admission and has no desire to eat. Even with assistance and cueing patient refuses POs. Has not been taking supplements. Has been hypoglycemic, presumably because of poor PO intake. Now CBG uncontrolled ranging from 129-403. DM coordinator following. She is now day 8 without adequate nutrition, recommend starting nutrition support if within GOC. Messaged MD about possible cortrak placement.    Admit weight: 75.4 kg  Current weight: 77.4 kg    Average Meal Intake: 2/19-2/24: 0-10% intake x 4 recorded meals  Nutritionally Relevant Medications: Scheduled Meds:  (feeding supplement) PROSource Plus  30 mL Oral BID BM   docusate sodium  100 mg Oral BID   feeding supplement  237 mL Oral BID BM   insulin aspart  0-5 Units Subcutaneous QHS   insulin aspart  0-9 Units Subcutaneous TID WC   levothyroxine  125 mcg Oral Q0600   multivitamin  1 tablet Oral QHS   polyethylene glycol  17 g Oral Daily   senna  1 tablet Oral BID   Continuous Infusions:  sodium chloride 75 mL/hr at 06/27/23 0850   Labs Reviewed: Potassium 5.2, Creatinine 2.01, Calcium 8.2, Albumin 1.9, Vitamin D 15.9 (05/2022)  CBG ranges from 129-403 mg/dL  over the last 24 hours HgbA1c 8.7   NUTRITION - FOCUSED PHYSICAL EXAM:  Flowsheet Row Most Recent Value  Orbital Region Mild depletion  Upper Arm Region No depletion  Thoracic and Lumbar Region No depletion  Buccal Region Mild depletion  Temple Region Mild depletion  Clavicle Bone Region Unable to assess  Clavicle and Acromion Bone Region Moderate depletion  Scapular Bone Region Moderate depletion  Dorsal Hand Mild depletion  Patellar Region Mild depletion   Anterior Thigh Region Mild depletion  Posterior Calf Region Mild depletion  Edema (RD Assessment) Mild  Hair Unable to assess  Eyes Reviewed  Mouth Unable to assess  Skin Reviewed  Nails Reviewed       Diet Order:   Diet Order             DIET DYS 3 Room service appropriate? No; Fluid consistency: Thin  Diet effective now                   EDUCATION NEEDS:   Not appropriate for education at this time  Skin:  Skin Assessment: Skin Integrity Issues: Skin Integrity Issues:: Incisions, Other (Comment) Incisions: Head Other: Non pressure woun dback, MASD breast  Last BM:  06/25/23  Height:   Ht Readings from Last 1 Encounters:  06/19/23 5\' 5"  (1.651 m)    Weight:   Wt Readings from Last 1 Encounters:  06/27/23 77.4 kg    Ideal Body Weight:  56.8 kg  BMI:  Body mass index is 28.4 kg/m.  Estimated Nutritional Needs:   Kcal:  2100-2300 kcal  Protein:  100-120 gm  Fluid:  >2L/day   Elliot Dally, RD Registered Dietitian  See Amion for more information

## 2023-06-27 NOTE — Inpatient Diabetes Management (Signed)
 Inpatient Diabetes Program Recommendations  AACE/ADA: New Consensus Statement on Inpatient Glycemic Control (2015)  Target Ranges:  Prepandial:   less than 140 mg/dL      Peak postprandial:   less than 180 mg/dL (1-2 hours)      Critically ill patients:  140 - 180 mg/dL   Lab Results  Component Value Date   GLUCAP 403 (H) 06/27/2023   HGBA1C 8.7 (H) 04/22/2023    Review of Glycemic Control  Latest Reference Range & Units 06/26/23 08:48 06/26/23 12:00 06/26/23 16:30 06/26/23 21:16 06/27/23 08:18 06/27/23 08:34  Glucose-Capillary 70 - 99 mg/dL 161 (H) 096 (H) 045 (H) 115 (H) 378 (H) 403 (H)   History: Type 1 Diabetes, CKD   Home DM Meds: Dexcom G6 CGM                             OmniPod 5 Insulin Pump                                 Current Orders: Novolog Sensitive  Correction Scale/ SSI (0-9 units) Q4 hours  Ensure Enlive bid between meals (40 grams of carbohydrate)  Inpatient Diabetes Program Recommendations:    -   consider adding Semglee 8 units  Thanks,  Christena Deem RN, MSN, BC-ADM Inpatient Diabetes Coordinator Team Pager [phone] (8a-5p)

## 2023-06-27 NOTE — TOC Progression Note (Signed)
 Transition of Care [facility]) - Progression Note    Patient Details  Name: Lauren [name] MRN: [id number] Date of Birth: 1980-06-21  Transition [facility]) CM/SW Contact  Astrid Drafts Berna Spare, RN Phone Number: 06/27/2023, 10:31 AM  Clinical Narrative:    Notified by Maryagnes Amos with [facility] AIR: patient has been denied for AIR, as [employer] plan is out of network and she does not have out of network benefits.  [employer] will not do a single case agreement, and [employer] rep has cancelled the auth request.     Expected Discharge Plan: IP Rehab Facility Barriers to Discharge: Continued Medical Work up  Expected Discharge Plan and Services   Discharge Planning Services: CM Consult Post Acute Care Choice: IP Rehab Living arrangements for the past 2 months: Single Family Home                                       Social Determinants of Health (SDOH) Interventions SDOH Screenings   Food Insecurity: Patient Unable To Answer (06/19/2023)  Recent Concern: Food Insecurity - Food Insecurity Present (05/21/2023)  Housing: Patient Declined (06/19/2023)  Transportation Needs: No Transportation Needs (06/19/2023)  Recent Concern: Transportation Needs - Unmet Transportation Needs (05/18/2023)  Utilities: Not At Risk (06/19/2023)  Alcohol Screen: Low Risk  (11/09/2022)  Depression (PHQ2-9): Low Risk  (02/18/2023)  Recent Concern: Depression (PHQ2-9) - Medium Risk (02/04/2023)  Financial Resource Strain: Low Risk  (11/09/2022)  Physical Activity: Inactive (03/03/2022)  Social Connections: Socially Integrated (03/03/2022)  Stress: No Stress Concern Present (03/03/2022)  Recent Concern: Stress - Stress Concern Present (12/22/2021)  Tobacco Use: High Risk (06/20/2023)    Readmission Risk Interventions    06/09/2023    3:19 PM 05/18/2023    1:36 PM 04/22/2023    3:05 PM  Readmission Risk Prevention Plan  Transportation Screening Complete Complete Complete  Medication Review Oceanographer)  Complete Complete Complete  PCP or Specialist appointment within 3-5 days of discharge   Not Complete  HRI or Home Care Consult Complete Complete Complete  SW Recovery Care/Counseling Consult Complete Complete Complete  Palliative Care Screening Not Applicable Not Applicable Not Applicable  Skilled Nursing Facility Not Applicable Complete Not Applicable   Quintella Baton, RN, BSN  Trauma/Neuro ICU Case Manager [phone]

## 2023-06-28 ENCOUNTER — Inpatient Hospital Stay: Payer: Self-pay | Admitting: Internal Medicine

## 2023-06-28 DIAGNOSIS — E43 Unspecified severe protein-calorie malnutrition: Secondary | ICD-10-CM | POA: Insufficient documentation

## 2023-06-28 DIAGNOSIS — S065XAA Traumatic subdural hemorrhage with loss of consciousness status unknown, initial encounter: Secondary | ICD-10-CM | POA: Diagnosis not present

## 2023-06-28 LAB — COMPREHENSIVE METABOLIC PANEL
ALT: 10 U/L (ref 0–44)
AST: 12 U/L — ABNORMAL LOW (ref 15–41)
Albumin: 2.2 g/dL — ABNORMAL LOW (ref 3.5–5.0)
Alkaline Phosphatase: 98 U/L (ref 38–126)
Anion gap: 14 (ref 5–15)
BUN: 15 mg/dL (ref 6–20)
CO2: 16 mmol/L — ABNORMAL LOW (ref 22–32)
Calcium: 8 mg/dL — ABNORMAL LOW (ref 8.9–10.3)
Chloride: 110 mmol/L (ref 98–111)
Creatinine, Ser: 2.06 mg/dL — ABNORMAL HIGH (ref 0.44–1.00)
GFR, Estimated: 30 mL/min — ABNORMAL LOW (ref >60)
Glucose, Bld: 323 mg/dL — ABNORMAL HIGH (ref 70–99)
Potassium: 4.4 mmol/L (ref 3.5–5.1)
Sodium: 140 mmol/L (ref 135–145)
Total Bilirubin: 1.1 mg/dL (ref 0.0–1.2)
Total Protein: 5.6 g/dL — ABNORMAL LOW (ref 6.5–8.1)

## 2023-06-28 LAB — GLUCOSE, CAPILLARY
Glucose-Capillary: 351 mg/dL — ABNORMAL HIGH (ref 70–99)
Glucose-Capillary: 183 mg/dL — ABNORMAL HIGH (ref 70–99)
Glucose-Capillary: 95 mg/dL (ref 70–99)
Glucose-Capillary: 112 mg/dL — ABNORMAL HIGH (ref 70–99)

## 2023-06-28 MED ORDER — SODIUM CHLORIDE 0.9 % IV SOLN: INTRAVENOUS | Status: DC

## 2023-06-28 MED ORDER — ENOXAPARIN SODIUM 40 MG/0.4ML IJ SOSY
40.0000 mg | PREFILLED_SYRINGE | Freq: Every day | INTRAMUSCULAR | Status: DC
  Administered 2023-06-28 – 2023-06-29 (×2): 40 mg via SUBCUTANEOUS
  Filled 2023-06-28 (×2): qty 0.4

## 2023-06-28 MED ORDER — METHOCARBAMOL 1000 MG/10ML IJ SOLN
500.0000 mg | Freq: Three times a day (TID) | INTRAMUSCULAR | Status: DC | PRN
  Administered 2023-06-28 – 2023-06-30 (×3): 500 mg via INTRAVENOUS
  Filled 2023-06-28 (×3): qty 10

## 2023-06-28 MED ORDER — METOPROLOL SUCCINATE ER 50 MG PO TB24
50.0000 mg | ORAL_TABLET | Freq: Every day | ORAL | Status: DC
  Administered 2023-06-29 – 2023-06-30 (×2): 50 mg via ORAL
  Filled 2023-06-28 (×3): qty 1

## 2023-06-28 MED ORDER — METOPROLOL SUCCINATE ER 25 MG PO TB24
25.0000 mg | ORAL_TABLET | Freq: Once | ORAL | Status: AC
  Administered 2023-06-28: 25 mg via ORAL
  Filled 2023-06-28: qty 1

## 2023-06-28 MED ORDER — SODIUM CHLORIDE 0.9 % IV BOLUS
1000.0000 mL | Freq: Once | INTRAVENOUS | Status: AC
  Administered 2023-06-28: 1000 mL via INTRAVENOUS

## 2023-06-28 MED ORDER — LORAZEPAM 2 MG/ML IJ SOLN
1.0000 mg | Freq: Once | INTRAMUSCULAR | Status: AC
  Administered 2023-06-28: 1 mg via INTRAVENOUS
  Filled 2023-06-28: qty 1

## 2023-06-28 NOTE — Patient Care Conference (Signed)
 Called and updated family over phone (pt's spouse and daughter)  Code Status re-addressed. ++NO INTUBATION++     OK for chest compression and CPR meds     OK for BiPAP      Also, acknowledged pt's wishes for no feeding tubes. Thus Cortrak order has been discontinued.

## 2023-06-28 NOTE — Progress Notes (Signed)
 Staples removed per Dr. Danielle Dess order, patient tolerated without issue. Incision approximated, clean, dry, and intact. Mitten applied to right hand to prevent scratching of incision.

## 2023-06-28 NOTE — Inpatient Diabetes Management (Signed)
 Inpatient Diabetes Program Recommendations  AACE/ADA: New Consensus Statement on Inpatient Glycemic Control (2015)  Target Ranges:  Prepandial:   less than 140 mg/dL      Peak postprandial:   less than 180 mg/dL (1-2 hours)      Critically ill patients:  140 - 180 mg/dL   Lab Results  Component Value Date   GLUCAP 351 (H) 06/28/2023   HGBA1C 8.7 (H) 04/22/2023    Review of Glycemic Control  Latest Reference Range & Units 06/27/23 08:18 06/27/23 08:34 06/27/23 11:34 06/27/23 16:16 06/27/23 20:52 06/28/23 08:00  Glucose-Capillary 70 - 99 mg/dL 409 (H) 811 (H) 914 (H) 98 163 (H) 351 (H)   History: Type 1 Diabetes, CKD   Home DM Meds: Dexcom G6 CGM                             OmniPod 5 Insulin Pump                                 Current Orders: Novolog Sensitive  Correction Scale/ SSI (0-9 units) Q4 hours Semglee 8 units Q24 hours  Ensure Enlive bid between meals (40 grams of carbohydrate)  Inpatient Diabetes Program Recommendations:    -   Consider adding Novolog 2 units tid meal coverage if eating >50% of meals/supplements -   Add Novolog HS scale  Thanks,  Christena Deem RN, MSN, BC-ADM Inpatient Diabetes Coordinator Team Pager [phone] (8a-5p)

## 2023-06-28 NOTE — Progress Notes (Signed)
 Speech Language Pathology Treatment: Dysphagia;Cognitive-Linquistic  Patient Details Name: Lauren [name] MRN: [id number] DOB: May 05, 1980 Today's Date: 06/28/2023 Time: 4098-1191 SLP Time Calculation (min) (ACUTE ONLY): 35 min  Assessment / Plan / Recommendation Clinical Impression  Pt was asleep upon SLP arrival, but once woken up became upset at pulse-ox on finger. Okay to remove per RN in an effort to reduce distraction, but pt was still very perseverative on "take it off." SLP and NTs assisted pt with repositioning, removal of anything that could be touching her, etc but despite Max cues to facilitate communication she continued to perseverate. Assisted pt to the EOB, where she self-fed several sips of thin liquids without difficulty. She could sustain her attention on other tasks or items briefly (less than one minute), before getting upset again. Pt was ultimately assisted back into bed where additional calming techniques including calming music were applied, and pt did calm down well with no further attempts to get OOB. Recommend ongoing SLP f/u for cognition/communication with 24/7 supervision recommended at discharge. Still not able to observe pt with solids today, but given that intake remains very limited, will try to advance to regular diet. Cognitively she will need assistance during meals anyway, so if there is any concern from staff regarding upgraded solids, please change back to mechanical soft until SLP can revisit.   HPI HPI: 43 yo pt admitted 2/16 after mechanical fall with head injury. CT Head showed R SDH with 9mm midline shift s/p evacuation 2/17. Pt known to this SLP team, seen most recently in December 2024 for swallow eval (normal appearing swallow, regular solids/thin liquids recommended) and January 2025 for speech/language eval (mild deficits related to L side attention, awareness, and memory; independent with finances but receives assistance with medication management). 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  Recommendations may be updated based on patient status, additional functional criteria and insurance authorization.    Recommendations  Diet recommendations: Regular;Thin liquid Liquids provided via: Cup;Straw Medication Administration: Whole meds with puree Supervision: Patient able to self feed;Full supervision/cueing for compensatory strategies Compensations: Minimize environmental distractions;Slow rate;Small sips/bites;Follow solids with liquid Postural Changes and/or Swallow Maneuvers: Seated upright 90 degrees                  Oral care BID   Frequent or constant Supervision/Assistance Cognitive communication deficit (Y78.295)     Continue with current plan of care     Mahala Menghini., M.A. CCC-SLP Acute Rehabilitation Services Office [phone]  Secure chat preferred   06/28/2023, 11:04 AM

## 2023-06-28 NOTE — Progress Notes (Signed)
 Occupational Therapy Treatment Patient Details Name: Lauren [name] MRN: [id number] DOB: 06-27-1980 Today's Date: 06/28/2023   History of present illness 42 yo female s/p 2/17 R fronto parietal cranitomy for SDH PMh DM1, seizures, CVA, CKD stage 4, NSTEMI, recent left proximal humerus fx after fall 12/23. 1/14-1/25 L ama R LE SFA to AT bypass on 1/29   OT comments  Patient received in supine with breakfast and patient not attempting to feed self. Patient assisted to EOB with mod assist and was provided setup and cues to feed self. Patient able to take bites of Magic Cup with cues and assistance to load utensil. Patient stopped feeding self and began "playing" with spoon and straw. Therapist attempted to have patient take bites but patient would spit food out or not take at all. Patient often perseverating on repeating words spoken by therapist and complained of pain but unable to specify. Patient was assisted back to supine with max assist. Patient will benefit from intensive inpatient follow-up therapy, >3 hours/day. Acute OT to continue to follow to address established goals to facilitate DC to next venue of care.       If plan is discharge home, recommend the following:  Two people to help with walking and/or transfers;Two people to help with bathing/dressing/bathroom   Equipment Recommendations  None recommended by OT    Recommendations for Other Services      Precautions / Restrictions Precautions Precautions: Fall Recall of Precautions/Restrictions: Impaired Precaution/Restrictions Comments: L hemiparesis Restrictions Weight Bearing Restrictions Per Provider Order: Yes LUE Weight Bearing Per Provider Order: Non weight bearing RLE Weight Bearing Per Provider Order: Weight bearing as tolerated       Mobility Bed Mobility Overal bed mobility: Needs Assistance Bed Mobility: Supine to Sit, Sit to Supine     Supine to sit: Mod assist Sit to supine: Max assist   General bed  mobility comments: assistance with BLE and trunk. Once back in supine patient able to roll self on left side without assistance    Transfers                   General transfer comment: not attempted     Balance Overall balance assessment: Needs assistance Sitting-balance support: No upper extremity supported, Feet supported Sitting balance-Leahy Scale: Fair Sitting balance - Comments: EOB with supervision                                   ADL either performed or assessed with clinical judgement   ADL Overall ADL's : Needs assistance/impaired Eating/Feeding: Sitting;Maximal assistance Eating/Feeding Details (indicate cue type and reason): patient on EOB and when asked to feed self patient would "play" with food and straw on drink Grooming: Wash/dry hands;Wash/dry face;Maximal assistance;Sitting                                 General ADL Comments: Patient with increased difficulty following commands    Extremity/Trunk Assessment              Vision       Perception     Praxis     Communication Communication Communication: No apparent difficulties   Cognition Arousal: Alert Behavior During Therapy: Lability Cognition: No family/caregiver present to determine baseline             OT - Cognition Comments: perseverating on words and  actions with cues to redirect                 Following commands: Impaired Following commands impaired: Follows one step commands inconsistently      Cueing   Cueing Techniques: Verbal cues, Tactile cues, Gestural cues  Exercises      Shoulder Instructions       General Comments HR increased to 120's while seated on EOB    Pertinent Vitals/ Pain       Pain Assessment Pain Assessment: Faces Faces Pain Scale: Hurts even more Pain Location: Patient stating all over Pain Descriptors / Indicators: Grimacing, Discomfort, Moaning Pain Intervention(s): Limited activity within  patient's tolerance, Monitored during session, Premedicated before session, Repositioned  Home Living                                          Prior Functioning/Environment              Frequency  Min 1X/week        Progress Toward Goals  OT Goals(current goals can now be found in the care plan section)  Progress towards OT goals: Progressing toward goals  Acute Rehab OT Goals Patient Stated Goal: none stated OT Goal Formulation: Patient unable to participate in goal setting Time For Goal Achievement: 07/12/23 Potential to Achieve Goals: Good ADL Goals Pt Will Perform Eating: with modified independence;sitting Pt Will Perform Grooming: with set-up;sitting Pt Will Perform Upper Body Bathing: with set-up;sitting Pt Will Perform Lower Body Bathing: with min assist;sit to/from stand;sitting/lateral leans Pt Will Perform Lower Body Dressing: sitting/lateral leans;sit to/from stand;with supervision Pt Will Transfer to Toilet: with mod assist;stand pivot transfer;bedside commode Pt Will Perform Toileting - Clothing Manipulation and hygiene: with min assist;sit to/from stand;sitting/lateral leans Additional ADL Goal #1: pt will manage L UE with R UE to elevate and position safe position mod I  Plan      Co-evaluation                 AM-PAC OT "6 Clicks" Daily Activity     Outcome Measure   Help from another person eating meals?: A Lot Help from another person taking care of personal grooming?: A Lot Help from another person toileting, which includes using toliet, bedpan, or urinal?: A Lot Help from another person bathing (including washing, rinsing, drying)?: A Lot Help from another person to put on and taking off regular upper body clothing?: A Lot Help from another person to put on and taking off regular lower body clothing?: A Lot 6 Click Score: 12    End of Session    OT Visit Diagnosis: Unsteadiness on feet (R26.81);Other abnormalities of  gait and mobility (R26.89);Muscle weakness (generalized) (M62.81)   Activity Tolerance Patient limited by pain   Patient Left in bed;with call bell/phone within reach;with bed alarm set;with nursing/sitter in room   Nurse Communication Mobility status;Precautions        Time: 1610-9604 OT Time Calculation (min): 29 min  Charges: OT General Charges $OT Visit: 1 Visit OT Treatments $Self Care/Home Management : 8-22 mins $Therapeutic Activity: 8-22 mins  Alfonse Flavors, OTA Acute Rehabilitation Services  Office [phone]   Dewain Penning 06/28/2023, 1:38 PM

## 2023-06-28 NOTE — Progress Notes (Signed)
 Patient ID: Lauren [name], female   DOB: 09/02/1980, 43 y.o.   MRN: [id number] Vital signs are stable Patient has been somewhat agitated The dressing is off her head Incision line is clean and dry and staples are in place They can be removed at this time I have discussed this with her nurse who will do the task.  Discussed with pharmacy consultant that patient can be on Lovenox or low-dose heparin prophylaxis therapy at this time I would not encourage full anticoagulation for at least 6 months time. I will remain available to see the patient but please contact us if further direct consultation is needed.

## 2023-06-28 NOTE — Plan of Care (Signed)
  Problem: Education: Goal: Knowledge of General Education information will improve Description: Including pain rating scale, medication(s)/side effects and non-pharmacologic comfort measures Outcome: Progressing   Problem: Health Behavior/Discharge Planning: Goal: Ability to manage health-related needs will improve Outcome: Progressing   Problem: Clinical Measurements: Goal: Ability to maintain clinical measurements within normal limits will improve Outcome: Progressing Goal: Will remain free from infection Outcome: Progressing Goal: Diagnostic test results will improve Outcome: Progressing Goal: Respiratory complications will improve Outcome: Progressing Goal: Cardiovascular complication will be avoided Outcome: Progressing   Problem: Activity: Goal: Risk for activity intolerance will decrease Outcome: Progressing   Problem: Nutrition: Goal: Adequate nutrition will be maintained Outcome: Progressing   Problem: Coping: Goal: Level of anxiety will decrease Outcome: Progressing   Problem: Elimination: Goal: Will not experience complications related to bowel motility Outcome: Progressing Goal: Will not experience complications related to urinary retention Outcome: Progressing   Problem: Pain Managment: Goal: General experience of comfort will improve and/or be controlled Outcome: Progressing   Problem: Safety: Goal: Ability to remain free from injury will improve Outcome: Progressing   Problem: Skin Integrity: Goal: Risk for impaired skin integrity will decrease Outcome: Progressing   Problem: Education: Goal: Ability to describe self-care measures that may prevent or decrease complications (Diabetes Survival Skills Education) will improve Outcome: Progressing Goal: Individualized Educational Video(s) Outcome: Progressing   Problem: Coping: Goal: Ability to adjust to condition or change in health will improve Outcome: Progressing   Problem: Fluid  Volume: Goal: Ability to maintain a balanced intake and output will improve Outcome: Progressing   Problem: Health Behavior/Discharge Planning: Goal: Ability to identify and utilize available resources and services will improve Outcome: Progressing Goal: Ability to manage health-related needs will improve Outcome: Progressing   Problem: Metabolic: Goal: Ability to maintain appropriate glucose levels will improve Outcome: Progressing   Problem: Nutritional: Goal: Maintenance of adequate nutrition will improve Outcome: Progressing Goal: Progress toward achieving an optimal weight will improve Outcome: Progressing   Problem: Skin Integrity: Goal: Risk for impaired skin integrity will decrease Outcome: Progressing   Problem: Tissue Perfusion: Goal: Adequacy of tissue perfusion will improve Outcome: Progressing   Problem: Safety: Goal: Non-violent Restraint(s) Outcome: Progressing   Problem: Education: Goal: Knowledge of the prescribed therapeutic regimen will improve Outcome: Progressing   Problem: Clinical Measurements: Goal: Usual level of consciousness will be regained or maintained. Outcome: Progressing Goal: Neurologic status will improve Outcome: Progressing Goal: Ability to maintain intracranial pressure will improve Outcome: Progressing   Problem: Skin Integrity: Goal: Demonstration of wound healing without infection will improve Outcome: Progressing

## 2023-06-28 NOTE — Progress Notes (Signed)
  Progress Note   Patient: Lauren [name] NWG:[id number] DOB: 1980/12/27 DOA: 06/19/2023     9 DOS: the patient was seen and examined on 06/28/2023   Brief hospital course: 43 year old female with a past medical history significant for CAD with prior MI, chronically anticoagulated with Coumadin CHF, CKD stage IV, diabetes, prior stroke 2023 and anemia who presented to the ED at [facility] 2/16 via EMS after suffering mechanical fall at home with head injury. On ED arrival patient was seen with mild hypotension but hemodynamically stable. Lab work significant for NA 134, glucose 237, creatinine 2.37, GFR 26, albumin 2.7, WBC 19.0, INR supratherapeutic at 7.8. Given fall with concern of head injury head CT was obtained on arrival and revealed right acute subdural hematoma measuring 2.1 with 9 mm midline shift. Neurosurgery consulted with recommendations made to transfer to [facility] for further care. PCCM consulted for further management admission   Assessment and Plan: Acute subdural hematoma with midline shift and brain compression -Acute right mixed density subdural hematoma with cerebral loculation suggesting some chronic features -Coumadin reversed in the ED History of seizure disorder History of prior CVA with left-sided residual weakness -Per Neurosurgery, OK for dvt prophylaxis, however no full dose anticoag for at least 6 mos   Chronic heart failure with reduced ejection fraction history of coronary artery disease with prior MI On Toprol-XL -appears dehydrated today, cont basal IVF -Increased toprol to 50mg  given sinus tach   Chronic kidney disease stage III -Cont to avoid nephrotoxic medications -Cr is trending up, suspect related to dehydration related to poor PO intake -Will continue basal IVF   Diabetes -recent hypoglycemia -Semglee resumed at 8 units per diabetic coordinator recs -cont sensitive scale SSI as needed   Hypothyroidism -Continue Synthroid -TSH 11.621 -Free T4  normal at 1.10   Continue statins  Hypoalbuminemia  Severe protein calorie malnutrition -Dietitian following. Pt is not taking in adequate PO -Dietary supplements recommended -Possible need for cortrak as pt is not taking PO, however pt's wishes do not align with TF per family. Will consult Palliative Care to address pt's GOC  Hyperkalemia -normalized      Subjective: Difficult to assess. Calling out "ouch" this AM  Physical Exam: Vitals:   06/28/23 0000 06/28/23 0334 06/28/23 0800 06/28/23 1623  BP: 97/67 118/72 (!) 138/108 123/66  Pulse:  (!) 104 (!) 118 (!) 102  Resp: 19 14 16    Temp:  98 F (36.7 C) 98.3 F (36.8 C) 99 F (37.2 C)  TempSrc:  Axillary Axillary Axillary  SpO2: 100% 100% 100%   Weight:  78.8 kg    Height:       General exam: Awake, laying in bed, in nad Respiratory system: Normal respiratory effort, no wheezing Cardiovascular system: regular rate, s1, s2 Gastrointestinal system: Soft, nondistended, positive BS Central nervous system: CN2-12 grossly intact, strength intact Extremities: Perfused, no clubbing Skin: Normal skin turgor, no notable skin lesions seen Psychiatry: Mood normal // no visual hallucinations   Data Reviewed:  Labs reviewed: Na 140, K 4.4, Cr 2.06  Family Communication: Pt in room, family over phone bedside  Disposition: Status is: Inpatient Remains inpatient appropriate because: severity of illness  Planned Discharge Destination: Rehab    Author: Rickey Barbara, MD 06/28/2023 6:14 PM  For on call review [email or web address].

## 2023-06-29 DIAGNOSIS — E43 Unspecified severe protein-calorie malnutrition: Secondary | ICD-10-CM | POA: Diagnosis not present

## 2023-06-29 DIAGNOSIS — Z7189 Other specified counseling: Secondary | ICD-10-CM

## 2023-06-29 DIAGNOSIS — Z8673 Personal history of transient ischemic attack (TIA), and cerebral infarction without residual deficits: Secondary | ICD-10-CM

## 2023-06-29 DIAGNOSIS — S065XAA Traumatic subdural hemorrhage with loss of consciousness status unknown, initial encounter: Secondary | ICD-10-CM | POA: Diagnosis not present

## 2023-06-29 DIAGNOSIS — Z515 Encounter for palliative care: Secondary | ICD-10-CM | POA: Diagnosis not present

## 2023-06-29 DIAGNOSIS — R638 Other symptoms and signs concerning food and fluid intake: Secondary | ICD-10-CM

## 2023-06-29 DIAGNOSIS — N179 Acute kidney failure, unspecified: Secondary | ICD-10-CM

## 2023-06-29 LAB — GLUCOSE, CAPILLARY
Glucose-Capillary: 163 mg/dL — ABNORMAL HIGH (ref 70–99)
Glucose-Capillary: 78 mg/dL (ref 70–99)
Glucose-Capillary: 98 mg/dL (ref 70–99)

## 2023-06-29 LAB — COMPREHENSIVE METABOLIC PANEL
ALT: 12 U/L (ref 0–44)
AST: 19 U/L (ref 15–41)
Albumin: 2.1 g/dL — ABNORMAL LOW (ref 3.5–5.0)
Alkaline Phosphatase: 103 U/L (ref 38–126)
Anion gap: 10 (ref 5–15)
BUN: 12 mg/dL (ref 6–20)
CO2: 19 mmol/L — ABNORMAL LOW (ref 22–32)
Calcium: 8 mg/dL — ABNORMAL LOW (ref 8.9–10.3)
Chloride: 114 mmol/L — ABNORMAL HIGH (ref 98–111)
Creatinine, Ser: 1.83 mg/dL — ABNORMAL HIGH (ref 0.44–1.00)
GFR, Estimated: 35 mL/min — ABNORMAL LOW (ref >60)
Glucose, Bld: 167 mg/dL — ABNORMAL HIGH (ref 70–99)
Potassium: 3.9 mmol/L (ref 3.5–5.1)
Sodium: 143 mmol/L (ref 135–145)
Total Bilirubin: 0.7 mg/dL (ref 0.0–1.2)
Total Protein: 5.7 g/dL — ABNORMAL LOW (ref 6.5–8.1)

## 2023-06-29 LAB — MAGNESIUM: Magnesium: 1.8 mg/dL (ref 1.7–2.4)

## 2023-06-29 LAB — BASIC METABOLIC PANEL
Anion gap: 11 (ref 5–15)
BUN: 12 mg/dL (ref 6–20)
CO2: 18 mmol/L — ABNORMAL LOW (ref 22–32)
Calcium: 8 mg/dL — ABNORMAL LOW (ref 8.9–10.3)
Chloride: 112 mmol/L — ABNORMAL HIGH (ref 98–111)
Creatinine, Ser: 1.85 mg/dL — ABNORMAL HIGH (ref 0.44–1.00)
GFR, Estimated: 35 mL/min — ABNORMAL LOW (ref >60)
Glucose, Bld: 164 mg/dL — ABNORMAL HIGH (ref 70–99)
Potassium: 3.8 mmol/L (ref 3.5–5.1)
Sodium: 141 mmol/L (ref 135–145)

## 2023-06-29 MED ORDER — LORAZEPAM 2 MG/ML PO CONC: 1.0000 mg | ORAL | Status: DC | PRN

## 2023-06-29 MED ORDER — INSULIN GLARGINE 100 UNIT/ML ~~LOC~~ SOLN
6.0000 [IU] | Freq: Two times a day (BID) | SUBCUTANEOUS | Status: DC
  Filled 2023-06-29 (×2): qty 0.06

## 2023-06-29 MED ORDER — POLYVINYL ALCOHOL 1.4 % OP SOLN: 1.0000 [drp] | Freq: Four times a day (QID) | OPHTHALMIC | Status: DC | PRN

## 2023-06-29 MED ORDER — PROSOURCE PLUS PO LIQD
30.0000 mL | Freq: Three times a day (TID) | ORAL | Status: DC
  Administered 2023-06-29: 30 mL via ORAL
  Filled 2023-06-29 (×2): qty 30

## 2023-06-29 MED ORDER — BIOTENE DRY MOUTH MT LIQD: 15.0000 mL | OROMUCOSAL | Status: DC | PRN

## 2023-06-29 MED ORDER — ACETAMINOPHEN 650 MG RE SUPP: 650.0000 mg | Freq: Four times a day (QID) | RECTAL | Status: DC | PRN

## 2023-06-29 MED ORDER — GLYCOPYRROLATE 0.2 MG/ML IJ SOLN: 0.2000 mg | INTRAMUSCULAR | Status: DC | PRN

## 2023-06-29 MED ORDER — LORAZEPAM 2 MG/ML IJ SOLN
1.0000 mg | INTRAMUSCULAR | Status: DC | PRN
  Administered 2023-06-30 – 2023-07-01 (×3): 1 mg via INTRAVENOUS
  Filled 2023-06-29 (×4): qty 1

## 2023-06-29 MED ORDER — INSULIN GLARGINE 100 UNIT/ML ~~LOC~~ SOLN
8.0000 [IU] | Freq: Two times a day (BID) | SUBCUTANEOUS | Status: DC
  Administered 2023-06-29: 8 [IU] via SUBCUTANEOUS
  Filled 2023-06-29 (×2): qty 0.08

## 2023-06-29 MED ORDER — LORAZEPAM 1 MG PO TABS: 1.0000 mg | ORAL_TABLET | ORAL | Status: DC | PRN

## 2023-06-29 MED ORDER — GLYCOPYRROLATE 1 MG PO TABS: 1.0000 mg | ORAL_TABLET | ORAL | Status: DC | PRN

## 2023-06-29 MED ORDER — SODIUM CHLORIDE 0.9 % IV SOLN: INTRAVENOUS | Status: DC

## 2023-06-29 MED ORDER — INSULIN GLARGINE 100 UNIT/ML ~~LOC~~ SOLN
8.0000 [IU] | Freq: Two times a day (BID) | SUBCUTANEOUS | Status: DC
  Filled 2023-06-29 (×2): qty 0.08

## 2023-06-29 MED ORDER — INSULIN ASPART 100 UNIT/ML IJ SOLN: 4.0000 [IU] | Freq: Three times a day (TID) | INTRAMUSCULAR | Status: DC

## 2023-06-29 MED ORDER — ACETAMINOPHEN 325 MG PO TABS
650.0000 mg | ORAL_TABLET | Freq: Four times a day (QID) | ORAL | Status: DC | PRN
  Administered 2023-06-30: 650 mg via ORAL
  Filled 2023-06-29: qty 2

## 2023-06-29 MED ORDER — VITAMIN D 25 MCG (1000 UNIT) PO TABS
1000.0000 [IU] | ORAL_TABLET | Freq: Every day | ORAL | Status: DC
  Administered 2023-06-29: 1000 [IU] via ORAL
  Filled 2023-06-29: qty 1

## 2023-06-29 NOTE — Progress Notes (Addendum)
 Nutrition Follow-up  DOCUMENTATION CODES:   Severe malnutrition in context of acute illness/injury  INTERVENTION:   House trays until AMS improves, meals with feeding assistance  Magic cup TID with meals, each supplement provides 290 kcal and 9 grams of protein Ensure Enlive po BID, each supplement provides 350 kcal and 20 grams of protein. Prosource Plus TID, each supplement provides 100 kcal and 15 gm protein   Rnea-vite daily  100 mg Thiamine and 1000 units vitamin D daily   Patient has had minimal nutrition for 10 days now with no interest in POs recommend nutrition support if within GOC  NUTRITION DIAGNOSIS:   Severe Malnutrition related to acute illness as evidenced by moderate muscle depletion, mild fat depletion, energy intake < or equal to 50% for > or equal to 5 days.  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  2/16 - Admitted to ICU  2/16 - Head CT: acute SDH with midline shift in the setting of supratherapeutic INR  2/20 - Transferred to Progressive care    Patient has had poor PO intake for 10 days now. RN reports she has just been taking bites of her meals and refusing supplements even with them providing assistance and encouragement. Only has been taking ProSource Plus. RD recommendation was for Cortrak placement today. However, MD spoke to patient and family, patient wishes for no feeding tubes which do not align with TF per family.  Cortrak order discontinued.  Palliative following for GOC.   Admit weight: 75.4 kg  Current weight: 78.8 kg   Average Meal Intake: 2/19-2/25: 0-10% intake x 7 recorded meals  Nutritionally Relevant Medications: Scheduled Meds:  (feeding supplement) PROSource Plus  30 mL Oral BID BM   docusate sodium  100 mg Oral BID   feeding supplement  237 mL Oral BID BM   insulin aspart  0-5 Units Subcutaneous QHS   insulin aspart  0-9 Units Subcutaneous TID WC   insulin glargine  8 Units Subcutaneous BID   isosorbide mononitrate  60 mg Oral Daily   levothyroxine  125 mcg Oral Q0600   multivitamin  1 tablet Oral QHS   senna  1 tablet Oral BID   thiamine  100 mg Oral Daily   Continuous Infusions:  sodium chloride 75 mL/hr at 06/29/23 0015   Labs Reviewed: Creatinine 2.06, Calcium 8, Vitamin D 15.9  CBG ranges from 95-183 mg/dL over the last 24 hours HgbA1c 8.7 Diet Order:   Diet Order             Diet regular Room service appropriate? No; Fluid consistency: Thin  Diet effective now                   EDUCATION NEEDS:   Not appropriate for education at this time  Skin:  Skin Assessment: Skin Integrity Issues: Skin Integrity Issues:: Incisions, Other (Comment) Incisions: Head Other: Non pressure woun dback, MASD breast  Last BM:  06/25/23  Height:  Ht Readings from Last 1 Encounters:  06/19/23 5\' 5"  (1.651 m)    Weight:   Wt Readings from Last 1 Encounters:  06/28/23 78.8 kg    Ideal Body Weight:  56.8 kg  BMI:  Body mass index is 28.91 kg/m.  Estimated Nutritional Needs:   Kcal:  2000-2200 kcal  Protein:  95-115 gm  Fluid:  >2L/day   Elliot Dally, RD Registered Dietitian  See Amion for more information

## 2023-06-29 NOTE — Consult Note (Signed)
 Palliative Care Consult Note                                  Date: 06/29/2023   Patient Name: Lauren [name]  DOB: 1980-06-27  MRN: [id number]  Age / Sex: 43 y.o., female  PCP: Lauren Lade, MD Referring Physician: David [name], Lauren Engels, MD  Reason for Consultation: Establishing goals of care  HPI/Patient Profile: 43 y.o. female  with past medical history of chronic HFrEF, diabetes type 2, CAD with prior MI, CKD stage III, PVD status post recent femoral-tibial bypass 06/01/2023, and history of prior stroke in 2023 with residual left-sided deficits.  She was on Coumadin for history of stroke and CAD. She presented to [facility] on 06/19/2023 after a mechanical fall at home with head injury.  CT head showed an acute right subdural hematoma with midline shift and INR 7.8 She was transferred to [facility] for neurosurgery consult.   Status post right frontotemporal craniotomy with evacuation of subdural hematoma on 2/17.  Today is day 10 of this hospitalization. Patient continues to have very poor p.o. intake.  Palliative Medicine has been consulted for goals of care and medical decision making.   Subjective:   Extensive chart review has been completed including labs, vital signs, imaging, progress/consult notes, orders, medications and available advance directive documents.   I met with patient, daughter/Lauren [name], and sister/Lauren [name] at bedside to discuss diagnosis, prognosis, and GOC.  Patient is known to PMT from her hospitalization in 2023 with acute stoke. I re-introduced Palliative Medicine as specialized medical care for people living with serious illness. It focuses on providing relief from the symptoms and stress of a serious illness.   Created space and opportunity for patient and family to express thoughts and feelings regarding current medical situation. Values and goals of care were attempted to be elicited.  Life  Review: Patient has been married to her husband Lauren Fearing for 27 years.  She has 1 daughter/Lauren [name] and 1 son.  Patient has not worked since 2022 due to health issues, but previously worked as a Education administrator at [employer].  Functional Status: Prior to admission, patient lived in an apartment with her husband. Family reports her functional status has declined over the last few months.  Clinical Assessment and GOC Discussion: Patient is alert but intermittently confused. She is moaning and stating that she has pain "everywhere" but especially in her right leg at the bypass incision.    Per MAR, patient has received oxycodone IR x 5 doses (50 mg total) in the past 24 hours.   I spoke with family in the conference room.  We discussed patient's current illness and what it means in the larger context of her ongoing co-morbidities.  We reviewed patient's multiple chronic and acute issues including SDH s/p craniotomy, AKI on CKD, PVD status post stent femoral tibial bypass, Family reports that patient has not had good quality of life ever since the stroke, and has overall had a very difficult time with overall declining health. We also discussed patient's poor p.o. intake and that this may indicate she is approaching end-of-life.  The difference between full scope medical intervention and comfort care was considered.  I introduced the concept of a comfort path, which means de-escalating full scope medical interventions and allowing a natural course to occur. Discussed that the goal is comfort and dignity rather than cure/prolonging life.  We also reviewed patient's  MOST form on file from 2023 that outlines the following treatment preferences: No CPR, limited additional interventions, antibiotics if indicated, IV fluids if indicated, and no feeding tube. ________________________________  15:45 - I later returned to bedside to meet again with patient and family (daughter, sister, and now  spouse). She received pain medication and is more comfortable, but also more lethargic.  We again reviewed patient's hospital course and current clinical condition. We reviewed option for comfort care and discussed what that would look like--keeping her warm and dry, no labs, no cardiac monitoring, no CBG checks, minimizing medications, no IV fluids, and liberalization of pain medication.   Patient remains intermittently confused, but has brief periods of being more lucid.  She expresses she "does not want to be here anymore".  She also states several times that she agrees with comfort care.  Family is supportive of this decision.    Review of Systems  Constitutional:        Generalized pain    Objective:   Primary Diagnoses: Present on Admission:  Subdural hematoma [facility])   Physical Exam Vitals reviewed.  Constitutional:      General: She is not in acute distress.    Appearance: She is ill-appearing.  HENT:     Head:     Comments: Right scalp incision Pulmonary:     Effort: Pulmonary effort is normal.  Neurological:     Mental Status: She is alert. She is confused.  Psychiatric:        Mood and Affect: Affect is tearful.     Palliative Assessment/Data: PPS 20-30%     Assessment & Plan:   SUMMARY OF RECOMMENDATIONS   Transition to comfort measures DC IV fluids, cardiac monitoring, CBG checks Minimize medications Referral to hospice facility in [facility]  PMT will continue to follow  Primary Decision Maker: HCPOA's are spouse/Lauren [name] and daughter/Lauren [name]  Code Status/Advance Care Planning: DNR  Symptom Management:  Oxycodone IR or fentanyl IV prn for pain or dyspnea Lorazepam (ATIVAN) prn for anxiety Glycopyrrolate (ROBINUL) for excessive secretions Ondansetron (ZOFRAN) prn for nausea Polyvinyl alcohol (LIQUIFILM TEARS) prn for dry eyes Antiseptic oral rinse (BIOTENE) prn for dry mouth  Prognosis:  < 4 weeks  Discharge Planning:  To Be Determined     Thank you for allowing [name] to participate in the care of Lauren [name]   Time Total: 124 minutes  Detailed review of medical records (labs, imaging, vital signs), medically appropriate exam, discussed with treatment team, counseling and education to patient, family, & staff, documenting clinical information, medication management, coordination of care.   Signed by: Sherlean Foot, NP Palliative Medicine Team  Team Phone # [phone]  For individual providers, please see AMION

## 2023-06-29 NOTE — Progress Notes (Signed)
 [facility] PROGRESS NOTE    Progress Note  Lauren [name]  BJY:[id number] DOB: 06-28-1980 DOA: 06/19/2023 PCP: Billie Lade, MD     Brief Narrative:   Lauren [name] is an 43 y.o. female past medical history significant for CAD with a prior MI, on Coumadin, systolic heart failure with an EF of 35%, chronic kidney disease stage III diabetes, CVA went to [facility] on 06/19/2023 after suffering a mechanical fall at home with head injury was found hypotensive, CT head found to have a acute right subdural hematoma about 9 mm with midline shift and INR 7.8. Neurosurgery was consulted recommended transfer to [facility].  Assessment/Plan:   Acute Subdural hematoma midline shift: INR was reversed in the ED. She has a history of seizures. Per neurosurgery okay for DVT prophylaxis. Status post right frontotemporal craniotomy with evacuation of subdural hematoma on 06/20/2023. Not a candidate for anticoagulation for at least 6 months. Continue to monitor as the patient is not eating enough.  She does not want a feeding tube. She has advanced directive she wants no feeding tube.  Chronic reduced EF heart failure: Continue Toprol, heart rate around 100.  Chronic kidney disease stage III: She was started on IV fluid  Diabetes mellitus type 2: Currently on long-acting insulin plus sliding scale blood glucose around. Increase long-acting insulin, add meal coverage. Continue CBGs before meals and at bedtime.  Hypothyroidism: Continue Synthroid. Repeat TSH and free T4 as an outpatient.  Severe protein caloric malnutrition/hypoalbuminemia: Patient not taking adequate oral intake continue dietary supplement. Palliative care was consulted.  Hyperkalemia: Now resolved.  DVT prophylaxis: lovenox Family Communication:none Status is: Inpatient Remains inpatient appropriate because: Acute CVA not eating enough.    Code Status:     Code Status Orders  (From admission,  onward)           Start     Ordered   06/19/23 1324  Full code  Continuous       Question:  By:  Answer:  Default: patient does not have capacity for decision making, no surrogate or prior directive available   06/19/23 1327           Code Status History     Date Active Date Inactive Code Status Order ID Comments User Context   06/01/2023 2017 06/10/2023 0209 Full Code [id number]  Forestine Na Inpatient   05/18/2023 0159 05/28/2023 1809 Limited: Do not attempt resuscitation (DNR) -DNR-LIMITED -Do Not Intubate/DNI  [id number]  Frankey Shown, DO ED   04/29/2023 1825 05/03/2023 1632 Limited: Do not attempt resuscitation (DNR) -DNR-LIMITED -Do Not Intubate/DNI  [id number]  Norton Blizzard, NP Inpatient   04/29/2023 1822 04/29/2023 1825 Limited: Do not attempt resuscitation (DNR) -DNR-LIMITED -Do Not Intubate/DNI  [id number]  Norton Blizzard, NP Inpatient   04/29/2023 1101 04/29/2023 1822 Do not attempt resuscitation (DNR) PRE-ARREST INTERVENTIONS DESIRED [id number]  Cathren Laine, MD ED   04/21/2023 2203 04/24/2023 2118 Limited: Do not attempt resuscitation (DNR) -DNR-LIMITED -Do Not Intubate/DNI  [id number]  Frankey Shown, DO ED   03/14/2023 0359 03/17/2023 0028 Full Code [id number]  Zierle-Ghosh, Asia B, DO ED   03/05/2023 1828 03/07/2023 0028 Full Code [id number]  Durwin Glaze, MD ED   11/07/2022 1458 11/12/2022 1809 DNR [id number]  Lonia Blood, MD ED   11/07/2022 1450 11/07/2022 1458 Full Code [id number]  Lonia Blood, MD ED   08/03/2022 0938 08/05/2022 1626 DNR [id number]  Jonah Blue, MD ED   07/21/2022 0714 07/27/2022 1428 Full Code  [id number]  Maretta Bees, MD Inpatient   07/19/2022 1333 07/21/2022 0714 DNR [id number]  Jonah Blue, MD ED   06/10/2022 1000 06/10/2022 2108 Full Code [id number]  Cephus Shelling, MD Inpatient   01/28/2022 1526 02/19/2022 1603 DNR [id number]  Milinda Antis, PA-C Inpatient   01/28/2022 1526 01/28/2022 1526 DNR [id number]  Milinda Antis,  PA-C Inpatient   01/24/2022 1355 01/28/2022 1440 DNR [id number]  Ernie Avena, NP Inpatient   01/08/2022 1925 01/24/2022 1355 Full Code [id number]  Julieanne Cotton, MD Inpatient   01/08/2022 1925 01/08/2022 1925 Full Code [id number]  Cindra Eves, NP Inpatient   01/08/2022 1536 01/08/2022 1925 Full Code [id number]  Gordy Councilman, MD ED   11/18/2021 1330 11/21/2021 1712 Full Code [id number]  Jonah Blue, MD Inpatient   08/27/2021 2326 09/05/2021 1544 Full Code [id number]  Therisa Doyne, MD ED   06/18/2021 1417 06/19/2021 1930 Full Code [id number]  Erick Blinks, MD ED   06/18/2020 1905 06/19/2020 1802 Full Code [id number]  Arrien, York Ram, MD ED   12/31/2019 2110 01/02/2020 1940 Full Code [id number]  Delano Metz, MD ED         IV Access:   Peripheral IV   Procedures and diagnostic studies:   No results found.   Medical Consultants:   None.   Subjective:    Lauren [name] she relates she is not hungry she does not want feeding tube.  Objective:    Vitals:   06/28/23 2201 06/28/23 2244 06/29/23 0309 06/29/23 0509  BP: 94/61 115/78 121/74 125/80  Pulse:  (!) 102    Resp:   10   Temp:  99.7 F (37.6 C) (!) 97.5 F (36.4 C)   TempSrc:  Axillary Axillary   SpO2:   99%   Weight:      Height:       SpO2: 99 % O2 Flow Rate (L/min): 5 L/min   Intake/Output Summary (Last 24 hours) at 06/29/2023 0658 Last data filed at 06/29/2023 0314 Gross per 24 hour  Intake 360.47 ml  Output 600 ml  Net -239.53 ml   Filed Weights   06/26/23 0500 06/27/23 0701 06/28/23 0334  Weight: 79 kg 77.4 kg 78.8 kg    Exam: General exam: In no acute distress.  Staples in her head Respiratory system: Good air movement and clear to auscultation. Cardiovascular system: S1 & S2 heard, RRR. No JVD. Gastrointestinal system: Abdomen is nondistended, soft and nontender.  Extremities: No pedal edema. Skin: No rashes, lesions or ulcers Psychiatry: Judgement and insight appear  normal. Mood & affect appropriate.    Data Reviewed:    Labs: Basic Metabolic Panel: Recent Labs  Lab 06/24/23 0815 06/25/23 0354 06/26/23 0451 06/27/23 0508 06/28/23 0619  NA 140 142 137 138 140  K 3.8 3.9 4.8 5.2* 4.4  CL 112* 106 109 108 110  CO2 21* 21* 20* 17* 16*  GLUCOSE 89 100* 149* 350* 323*  BUN 15 13 12 16 15   CREATININE 1.82* 1.54* 1.55* 2.01* 2.06*  CALCIUM 7.6* 8.4* 7.9* 8.2* 8.0*   GFR Estimated Creatinine Clearance: 37.3 mL/min (A) (by C-G formula based on SCr of 2.06 mg/dL (H)). Liver Function Tests: Recent Labs  Lab 06/25/23 0354 06/26/23 0451 06/27/23 0508 06/28/23 0619  AST 13* 23 22 12*  ALT 9 5 8 10   ALKPHOS 93 107 112 98  BILITOT 0.7 1.1 1.6* 1.1  PROT 5.0* 5.3* 5.8* 5.6*  ALBUMIN 1.7* 1.7* 1.9* 2.2*   No results for  input(s): "LIPASE", "AMYLASE" in the last 168 hours. No results for input(s): "AMMONIA" in the last 168 hours. Coagulation profile No results for input(s): "INR", "PROTIME" in the last 168 hours. COVID-19 Labs  No results for input(s): "DDIMER", "FERRITIN", "LDH", "CRP" in the last 72 hours.  Lab Results  Component Value Date   SARSCOV2NAA NEGATIVE 04/29/2023   SARSCOV2NAA NEGATIVE 08/02/2022   SARSCOV2NAA NEGATIVE 01/08/2022   SARSCOV2NAA NEGATIVE 06/18/2021    CBC: Recent Labs  Lab 06/22/23 1600 06/23/23 0500 06/24/23 0815  WBC  --  12.8* 10.5  NEUTROABS  --   --  7.9*  HGB 7.7* 8.1* 8.0*  HCT 23.9* 25.1* 25.4*  MCV  --  87.5 88.2  PLT  --  329 296   Cardiac Enzymes: No results for input(s): "CKTOTAL", "CKMB", "CKMBINDEX", "TROPONINI" in the last 168 hours. BNP (last 3 results) No results for input(s): "PROBNP" in the last 8760 hours. CBG: Recent Labs  Lab 06/27/23 2052 06/28/23 0800 06/28/23 1210 06/28/23 1621 06/28/23 2110  GLUCAP 163* 351* 183* 95 112*   D-Dimer: No results for input(s): "DDIMER" in the last 72 hours. Hgb A1c: No results for input(s): "HGBA1C" in the last 72 hours. Lipid  Profile: No results for input(s): "CHOL", "HDL", "LDLCALC", "TRIG", "CHOLHDL", "LDLDIRECT" in the last 72 hours. Thyroid function studies: No results for input(s): "TSH", "T4TOTAL", "T3FREE", "THYROIDAB" in the last 72 hours.  Invalid input(s): "FREET3" Anemia work up: No results for input(s): "VITAMINB12", "FOLATE", "FERRITIN", "TIBC", "IRON", "RETICCTPCT" in the last 72 hours. Sepsis Labs: Recent Labs  Lab 06/23/23 0500 06/24/23 0815  WBC 12.8* 10.5   Microbiology Recent Results (from the past 240 hours)  MRSA Next Gen by PCR, Nasal     Status: None   Collection Time: 06/19/23  1:09 PM   Specimen: Nasal Mucosa; Nasal Swab  Result Value Ref Range Status   MRSA by PCR Next Gen NOT DETECTED NOT DETECTED Final    Comment: (NOTE) The GeneXpert MRSA Assay (FDA approved for NASAL specimens only), is one component of a comprehensive MRSA colonization surveillance program. It is not intended to diagnose MRSA infection nor to guide or monitor treatment for MRSA infections. Test performance is not FDA approved in patients less than 57 years old. Performed at [facility], [address] [place], [place] [address]      Medications:    (feeding supplement) PROSource Plus  30 mL Oral BID BM   atorvastatin  40 mg Oral Daily   Chlorhexidine Gluconate Cloth  6 each Topical Daily   docusate sodium  100 mg Oral BID   enoxaparin (LOVENOX) injection  40 mg Subcutaneous Daily   feeding supplement  237 mL Oral BID BM   hydrALAZINE  25 mg Oral Q8H   insulin aspart  0-5 Units Subcutaneous QHS   insulin aspart  0-9 Units Subcutaneous TID WC   insulin glargine  8 Units Subcutaneous Q24H   isosorbide mononitrate  60 mg Oral Daily   levothyroxine  125 mcg Oral Q0600   metoprolol succinate  50 mg Oral Daily   multivitamin  1 tablet Oral QHS   mouth rinse  15 mL Mouth Rinse 4 times per day   pantoprazole  40 mg Oral QHS   polyethylene glycol  17 g Oral Daily   QUEtiapine  25 mg Oral  BID   senna  1 tablet Oral BID   thiamine  100 mg Oral Daily   topiramate  50 mg Oral BID  Continuous Infusions:  sodium chloride 75 mL/hr at 06/29/23 0015      LOS: 10 days   Marinda Elk  [facility]  06/29/2023, 6:58 AM

## 2023-06-29 NOTE — Progress Notes (Signed)
 PT Cancellation Note  Patient Details Name: Lauren [name] MRN: [id number] DOB: [birth year]   Cancelled Treatment:    Reason Eval/Treat Not Completed: Other (comment) - family and pt considering comfort care, palliative coming back today to have discussions. PT to hold until decision is made.   Marye Round, PT DPT Acute Rehabilitation Services Secure Chat Preferred  Office [phone]    Truddie Coco 06/29/2023, 2:51 PM

## 2023-06-29 NOTE — Plan of Care (Signed)
  Problem: Health Behavior/Discharge Planning: Goal: Ability to manage health-related needs will improve Outcome: Progressing   Problem: Education: Goal: Knowledge of General Education information will improve Description: Including pain rating scale, medication(s)/side effects and non-pharmacologic comfort measures Outcome: Progressing   Problem: Safety: Goal: Ability to remain free from injury will improve Outcome: Progressing   Problem: Nutritional: Goal: Maintenance of adequate nutrition will improve Outcome: Progressing   Problem: Pain Managment: Goal: General experience of comfort will improve and/or be controlled Outcome: Progressing

## 2023-06-30 DIAGNOSIS — S065XAA Traumatic subdural hemorrhage with loss of consciousness status unknown, initial encounter: Secondary | ICD-10-CM | POA: Diagnosis not present

## 2023-06-30 DIAGNOSIS — I6930 Unspecified sequelae of cerebral infarction: Secondary | ICD-10-CM | POA: Diagnosis not present

## 2023-06-30 DIAGNOSIS — R627 Adult failure to thrive: Secondary | ICD-10-CM

## 2023-06-30 DIAGNOSIS — E43 Unspecified severe protein-calorie malnutrition: Secondary | ICD-10-CM | POA: Diagnosis not present

## 2023-06-30 DIAGNOSIS — Z515 Encounter for palliative care: Secondary | ICD-10-CM | POA: Diagnosis not present

## 2023-06-30 LAB — GLUCOSE, CAPILLARY: Glucose-Capillary: 162 mg/dL — ABNORMAL HIGH (ref 70–99)

## 2023-06-30 MED ORDER — HYDROMORPHONE HCL 1 MG/ML IJ SOLN
1.0000 mg | INTRAMUSCULAR | Status: DC | PRN
  Administered 2023-06-30 – 2023-07-01 (×6): 1 mg via INTRAVENOUS
  Filled 2023-06-30 (×7): qty 1

## 2023-06-30 NOTE — Progress Notes (Signed)
 Palliative Medicine Progress Note   Patient Name: Lauren [name]       Date: 06/30/2023 DOB: 01-Jun-1980  Age: 43 y.o. MRN#: [id number] Attending Physician: Marinda Elk, MD Primary Care Physician: Billie Lade, MD Admit Date: 06/19/2023   HPI/Patient Profile: 43 y.o. female  with past medical history of chronic HFrEF, diabetes type 2, CAD with prior MI, CKD stage III, PVD status post recent femoral-tibial bypass 06/01/2023, and history of prior stroke in 2023 with residual left-sided deficits.  She was on Coumadin for history of stroke and CAD. She presented to [facility] on 06/19/2023 after a mechanical fall at home with head injury.  CT head showed an acute right subdural hematoma with midline shift and INR 7.8 She was transferred to [facility] for neurosurgery consult.    Status post right frontotemporal craniotomy with evacuation of subdural hematoma on 2/17. Post-op course was complicated by ongoing poor p.o. intake.   Palliative Medicine has been consulted for goals of care and medical decision making.   Subjective: Chart reviewed and update received from RN.  Patient assessed at bedside. She is sleeping and appears comfortable. I did not attempt to wake her. No non-verbal signs of pain or discomfort noted. Respirations are even and unlabored.  I later spoke with her daughter/[name] by phone. Reviewed natural trajectory at EOL. Emotional support provided. Al Decant expresses appreciation that her mother is comfortable as per her wishes.    Objective:  Physical Exam Vitals reviewed.  Constitutional:      General: She is sleeping. She is not in acute distress.    Appearance: She is ill-appearing.  Pulmonary:     Effort: No respiratory distress.  Neurological:     Mental  Status: She is lethargic.             Palliative Medicine Assessment & Plan   Assessment: Principal Problem:   Subdural hematoma (HCC) Active Problems:   Protein-calorie malnutrition, severe    Recommendations/Plan: Continue comfort measures Added dilaudid 1 mg IV every 3 hours as needed for pain or dyspnea Referral to hospice facility in [facility] - TOC aware PMT will continue to follow  Symptom Management:  Oxycodone IR or dilaudid IV prn for pain or dyspnea Lorazepam (ATIVAN) prn for anxiety Glycopyrrolate (ROBINUL) for excessive secretions Ondansetron (ZOFRAN) prn for nausea Polyvinyl  alcohol (LIQUIFILM TEARS) prn for dry eyes Antiseptic Lauren rinse (BIOTENE) prn for dry mouth   Code Status: DNR - comfort   Prognosis:  < 4 weeks  Discharge Planning: Hospice facility   Thank you for allowing the Palliative Medicine Team to assist in the care of this patient.   Time: 36 minutes   Merry Proud, NP   Please contact Palliative Medicine Team phone at [phone] for questions and concerns.  For individual providers, please see AMION.

## 2023-06-30 NOTE — Progress Notes (Addendum)
 [facility] PROGRESS NOTE    Progress Note  Lauren [name]  RUE:[id number] DOB: 1980-10-19 DOA: 06/19/2023 PCP: Lauren Lade, MD     Brief Narrative:   Lauren [name] is an 43 y.o. female past medical history significant for CAD with a prior MI, on Coumadin, systolic heart failure with an EF of 35%, chronic kidney disease stage III diabetes, CVA went to [facility] on 06/19/2023 after suffering a mechanical fall at home with head injury was found hypotensive, CT head found to have a acute right subdural hematoma about 9 mm with midline shift and INR 7.8. Neurosurgery was consulted recommended transfer to [facility].  Assessment/Plan:   Acute Subdural hematoma midline shift: INR was reversed in the ED. Status post right frontotemporal craniotomy with evacuation of subdural hematoma on 06/20/2023. Palliative care met with family and they decided to move towards comfort measure. Transfer to MedSurg. Question whether she would go home with hospice or to a residential hospice facility. Family and patient want hospice at a [facility] facility All medications not related to comfort measures will discontinue. Transferred to MedSurg  Chronic reduced EF heart failure: We will continue Toprol for now  Chronic kidney disease stage III: KVO IV fluids  Diabetes mellitus type 2/hypoglycemia: Discussed CBG checks and sliding scale.  Hypothyroidism: Severe protein caloric malnutrition/hypoalbuminemia: Hyperkalemia:  DVT prophylaxis: lovenox Family Communication:none Status is: Inpatient Remains inpatient appropriate because: Acute CVA not eating enough.    Code Status:     Code Status Orders  (From admission, onward)           Start     Ordered   06/19/23 1324  Full code  Continuous       Question:  By:  Answer:  Default: patient does not have capacity for decision making, no surrogate or prior directive available   06/19/23 1327           Code Status  History     Date Active Date Inactive Code Status Order ID Comments User Context   06/01/2023 2017 06/10/2023 0209 Full Code [id number]  Lauren [name] Inpatient   05/18/2023 0159 05/28/2023 1809 Limited: Do not attempt resuscitation (DNR) -DNR-LIMITED -Do Not Intubate/DNI  [id number]  Lauren Shown, DO ED   04/29/2023 1825 05/03/2023 1632 Limited: Do not attempt resuscitation (DNR) -DNR-LIMITED -Do Not Intubate/DNI  [id number]  Lauren Blizzard, NP Inpatient   04/29/2023 1822 04/29/2023 1825 Limited: Do not attempt resuscitation (DNR) -DNR-LIMITED -Do Not Intubate/DNI  [id number]  Lauren Blizzard, NP Inpatient   04/29/2023 1101 04/29/2023 1822 Do not attempt resuscitation (DNR) PRE-ARREST INTERVENTIONS DESIRED [id number]  Lauren Laine, MD ED   04/21/2023 2203 04/24/2023 2118 Limited: Do not attempt resuscitation (DNR) -DNR-LIMITED -Do Not Intubate/DNI  [id number]  Lauren Shown, DO ED   03/14/2023 0359 03/17/2023 0028 Full Code [id number]  [name], Lauren B, DO ED   03/05/2023 1828 03/07/2023 0028 Full Code [id number]  Lauren Glaze, MD ED   11/07/2022 1458 11/12/2022 1809 DNR [id number]  Lauren Blood, MD ED   11/07/2022 1450 11/07/2022 1458 Full Code [id number]  Lauren Blood, MD ED   08/03/2022 0938 08/05/2022 1626 DNR [id number]  Lauren Blue, MD ED   07/21/2022 0714 07/27/2022 1428 Full Code [id number]  Lauren Bees, MD Inpatient   07/19/2022 1333 07/21/2022 0714 DNR [id number]  Lauren Blue, MD ED   06/10/2022 1000 06/10/2022 2108 Full Code [id number]  Lauren Shelling, MD Inpatient   01/28/2022 1526 02/19/2022 1603 DNR [id number]  Lauren [name],  Maren [name] Inpatient   01/28/2022 1526 01/28/2022 1526 DNR [id number]  Lauren Antis, PA-C Inpatient   01/24/2022 1355 01/28/2022 1440 DNR [id number]  Lauren Avena, NP Inpatient   01/08/2022 1925 01/24/2022 1355 Full Code [id number]  Lauren Cotton, MD Inpatient   01/08/2022 1925 01/08/2022 1925 Full Code [id number]  Lauren Eves, NP  Inpatient   01/08/2022 1536 01/08/2022 1925 Full Code [id number]  Lauren Councilman, MD ED   11/18/2021 1330 11/21/2021 1712 Full Code [id number]  Lauren Blue, MD Inpatient   08/27/2021 2326 09/05/2021 1544 Full Code [id number]  Lauren Doyne, MD ED   06/18/2021 1417 06/19/2021 1930 Full Code [id number]  Lauren Blinks, MD ED   06/18/2020 1905 06/19/2020 1802 Full Code [id number]  Arrien, York Ram, MD ED   12/31/2019 2110 01/02/2020 1940 Full Code [id number]  Lauren Metz, MD ED         IV Access:   Peripheral IV   Procedures and diagnostic studies:   No results found.   Medical Consultants:   None.   Subjective:    Lauren [name] she relates she is comfortable has no new complaints this morning.  Objective:    Vitals:   06/29/23 0509 06/29/23 0709 06/29/23 0722 06/29/23 1957  BP: 125/80  139/86 135/83  Pulse:   (!) 110 (!) 103  Resp:   15   Temp:   98 F (36.7 C) 98 F (36.7 C)  TempSrc:   Oral Oral  SpO2:   100% 97%  Weight:  81.2 kg    Height:       SpO2: 97 % O2 Flow Rate (L/min): 5 L/min   Intake/Output Summary (Last 24 hours) at 06/30/2023 0730 Last data filed at 06/29/2023 1957 Gross per 24 hour  Intake 475.83 ml  Output 450 ml  Net 25.83 ml   Filed Weights   06/27/23 0701 06/28/23 0334 06/29/23 0709  Weight: 77.4 kg 78.8 kg 81.2 kg    Exam: General exam: In no acute distress. Respiratory system: Good air movement and clear to auscultation. Cardiovascular system: S1 & S2 heard, RRR. No JVD. Gastrointestinal system: Abdomen is nondistended, soft and nontender.  Extremities: No pedal edema. Skin: No rashes, lesions or ulcers Psychiatry: Judgement and insight appear normal. Mood & affect appropriate.  Data Reviewed:    Labs: Basic Metabolic Panel: Recent Labs  Lab 06/25/23 0354 06/26/23 0451 06/27/23 0508 06/28/23 0619 06/29/23 0846  [name] 142 137 138 140 143  141  K 3.9 4.8 5.2* 4.4 3.9  3.8  CL 106 109 108 110 114*  112*   CO2 21* 20* 17* 16* 19*  18*  GLUCOSE 100* 149* 350* 323* 167*  164*  BUN 13 12 16 15 12  12   CREATININE 1.54* 1.55* 2.01* 2.06* 1.83*  1.85*  CALCIUM 8.4* 7.9* 8.2* 8.0* 8.0*  8.0*  MG  --   --   --   --  1.8   GFR Estimated Creatinine Clearance: 42.6 mL/min (A) (by C-G formula based on SCr of 1.83 mg/dL (H)). Liver Function Tests: Recent Labs  Lab 06/25/23 0354 06/26/23 0451 06/27/23 0508 06/28/23 0619 06/29/23 0846  AST 13* 23 22 12* 19  ALT 9 5 8 10 12   ALKPHOS 93 107 112 98 103  BILITOT 0.7 1.1 1.6* 1.1 0.7  PROT 5.0* 5.3* 5.8* 5.6* 5.7*  ALBUMIN 1.7* 1.7* 1.9* 2.2* 2.1*   No results for input(s): "LIPASE", "AMYLASE" in the last 168 hours. No results for input(s): "AMMONIA"  in the last 168 hours. Coagulation profile No results for input(s): "INR", "PROTIME" in the last 168 hours. COVID-19 Labs  No results for input(s): "DDIMER", "FERRITIN", "LDH", "CRP" in the last 72 hours.  Lab Results  Component Value Date   SARSCOV2NAA NEGATIVE 04/29/2023   SARSCOV2NAA NEGATIVE 08/02/2022   SARSCOV2NAA NEGATIVE 01/08/2022   SARSCOV2NAA NEGATIVE 06/18/2021    CBC: Recent Labs  Lab 06/24/23 0815  WBC 10.5  NEUTROABS 7.9*  HGB 8.0*  HCT 25.4*  MCV 88.2  PLT 296   Cardiac Enzymes: No results for input(s): "CKTOTAL", "CKMB", "CKMBINDEX", "TROPONINI" in the last 168 hours. BNP (last 3 results) No results for input(s): "PROBNP" in the last 8760 hours. CBG: Recent Labs  Lab 06/28/23 1621 06/28/23 2110 06/29/23 0718 06/29/23 1202 06/29/23 1641  GLUCAP 95 112* 163* 78 98   D-Dimer: No results for input(s): "DDIMER" in the last 72 hours. Hgb A1c: No results for input(s): "HGBA1C" in the last 72 hours. Lipid Profile: No results for input(s): "CHOL", "HDL", "LDLCALC", "TRIG", "CHOLHDL", "LDLDIRECT" in the last 72 hours. Thyroid function studies: No results for input(s): "TSH", "T4TOTAL", "T3FREE", "THYROIDAB" in the last 72 hours.  Invalid input(s):  "FREET3" Anemia work up: No results for input(s): "VITAMINB12", "FOLATE", "FERRITIN", "TIBC", "IRON", "RETICCTPCT" in the last 72 hours. Sepsis Labs: Recent Labs  Lab 06/24/23 0815  WBC 10.5   Microbiology No results found for this or any previous visit (from the past 240 hours).    Medications:    Chlorhexidine Gluconate Cloth  6 each Topical Daily   docusate sodium  100 mg Oral BID   isosorbide mononitrate  60 mg Oral Daily   levothyroxine  125 mcg Oral Q0600   metoprolol succinate  50 mg Oral Daily   mouth rinse  15 mL Mouth Rinse 4 times per day   pantoprazole  40 mg Oral QHS   polyethylene glycol  17 g Oral Daily   QUEtiapine  25 mg Oral BID   senna  1 tablet Oral BID   topiramate  50 mg Oral BID   Continuous Infusions:      LOS: 11 days   Marinda Elk  [facility]  06/30/2023, 7:30 AM

## 2023-06-30 NOTE — Plan of Care (Signed)
   Problem: Coping: Goal: Level of anxiety will decrease Outcome: Progressing   Problem: Pain Managment: Goal: General experience of comfort will improve and/or be controlled Outcome: Progressing   Problem: Safety: Goal: Ability to remain free from injury will improve Outcome: Progressing

## 2023-06-30 NOTE — Progress Notes (Signed)
 Nutrition Brief Note  Chart reviewed. Pt now transitioning to comfort care.  No further nutrition interventions planned at this time.  Please re-consult as needed.   Elliot Dally, RD Registered Dietitian  See Amion for more information

## 2023-06-30 NOTE — TOC Progression Note (Addendum)
 Transition of Care (TOC) - Progression Note  Donn Pierini RN,BSN Transitions of Care Unit 4NP (Non Trauma)- RN Case Manager See Treatment Team for direct Phone #   Patient Details  Name: Lauren [name] MRN: [id number] Date of Birth: 1980-11-25  Transition of Care [facility]) CM/SW Contact  Zenda Alpers Lenn Sink, RN Phone Number: 06/30/2023, 11:52 AM  Clinical Narrative:    Received msg from [facility] that family has decided on comfort care and requesting [facility] for [employer] home.  Order has been placed.   Referral sent to [facility] via hub, and call made to [place]- spoke with [facility]. Referral to be reviewed.   Received call back from [place] that one of their hospice nurses will come do bedside assessment this afternoon between 2:30-3:00pm. [facility] to reach out to daughter [place].   Hospice referral pending acceptance and bed availability.   1625- received call from [facility]-at [facility]- pt has been approved for INPT hospice home- bed available to admit tonight- family has completed consents. On Call report # for bedside RN- [phone] Attending notified and states pt will d/c tomorrow- call made back to [facility]- and Hospice is fine with pt coming to [facility] tomorrow am. Number for RN to call report in am- is the same.   Pt will need GOLD DNR for transport TOC will f/u in am to set up transport via EMS  Expected Discharge Plan: Hospice Medical Facility Barriers to Discharge: Continued Medical Work up  Expected Discharge Plan and Services   Discharge Planning Services: CM Consult Post Acute Care Choice: Hospice Living arrangements for the past 2 months: Single Family Home                           HH Arranged: Disease Management HH Agency: [facility] Date [facility] Agency Contacted: 06/30/23 Time HH Agency Contacted: 1151 Representative spoke with at [facility] Agency: Delfina Redwood   Social Determinants of Health (SDOH) Interventions SDOH  Screenings   Food Insecurity: Patient Unable To Answer (06/19/2023)  Recent Concern: Food Insecurity - Food Insecurity Present (05/21/2023)  Housing: Patient Declined (06/19/2023)  Transportation Needs: No Transportation Needs (06/19/2023)  Recent Concern: Transportation Needs - Unmet Transportation Needs (05/18/2023)  Utilities: Not At Risk (06/19/2023)  Alcohol Screen: Low Risk  (11/09/2022)  Depression (PHQ2-9): Low Risk  (02/18/2023)  Recent Concern: Depression (PHQ2-9) - Medium Risk (02/04/2023)  Financial Resource Strain: Low Risk  (11/09/2022)  Physical Activity: Inactive (03/03/2022)  Social Connections: Socially Integrated (03/03/2022)  Stress: No Stress Concern Present (03/03/2022)  Recent Concern: Stress - Stress Concern Present (12/22/2021)  Tobacco Use: High Risk (06/20/2023)    Readmission Risk Interventions    06/09/2023    3:19 PM 05/18/2023    1:36 PM 04/22/2023    3:05 PM  Readmission Risk Prevention Plan  Transportation Screening Complete Complete Complete  Medication Review Oceanographer) Complete Complete Complete  PCP or Specialist appointment within 3-5 days of discharge   Not Complete  HRI or Home Care Consult Complete Complete Complete  SW Recovery Care/Counseling Consult Complete Complete Complete  Palliative Care Screening Not Applicable Not Applicable Not Applicable  Skilled Nursing Facility Not Applicable Complete Not Applicable

## 2023-07-01 ENCOUNTER — Ambulatory Visit: Payer: Self-pay | Admitting: *Deleted

## 2023-07-01 DIAGNOSIS — E43 Unspecified severe protein-calorie malnutrition: Secondary | ICD-10-CM | POA: Diagnosis not present

## 2023-07-01 DIAGNOSIS — S065XAA Traumatic subdural hemorrhage with loss of consciousness status unknown, initial encounter: Secondary | ICD-10-CM | POA: Diagnosis not present

## 2023-07-01 MED ORDER — LORAZEPAM 2 MG/ML IJ SOLN: 1.0000 mg | INTRAMUSCULAR | Status: DC | PRN

## 2023-07-01 NOTE — Patient Outreach (Signed)
 Care Coordination   Multidisciplinary Case Review Note    07/01/2023 Name: Shamaria Kavan MRN: [id number] DOB: [birth year]  Torina Ey is a 43 y.o. year old female who sees Durwin Nora, Lucina Mellow, MD for primary care.  The  multidisciplinary care team met today to review patient care needs and barriers.   RN [facility] Hospital Liaison Discussed pt transfer to hospice at [facility] house  VBCI case closure    Goals Addressed   None     SDOH assessments and interventions completed:  No     Care Coordination Interventions Activated:  No   Care Coordination Interventions:  No, not indicated   Follow up plan: No further intervention required.   Multidisciplinary Team Attendees:   Charlesetta Shanks Clinton Gallant RN CCM  Scribe for Multidisciplinary Case Review:  Edd Arbour, RN CCM

## 2023-07-01 NOTE — Discharge Summary (Signed)
 Physician Discharge Summary  Lauren [name] GNF:[id number] DOB: [birth year] DOA: 06/19/2023  PCP: Billie Lade, MD  Admit date: 06/19/2023 Discharge date: 07/01/2023  Admitted From: Home Disposition:  Residential Hospice facility  Recommendations for Outpatient Follow-up:  Will go to residential hospice facility   Home Health:No Equipment/Devices:None  Discharge Condition:Stable CODE STATUS:DNR Diet recommendation: Heart Healthy   Brief/Interim Summary: 43 y.o. female past medical history significant for CAD with a prior MI, on Coumadin, systolic heart failure with an EF of 35%, chronic kidney disease stage III diabetes, CVA went to [facility] on 06/19/2023 after suffering a mechanical fall at home with head injury was found hypotensive, CT head found to have a acute right subdural hematoma about 9 mm with midline shift and INR 7.8. Neurosurgery was consulted recommended transfer to [facility]   Discharge Diagnoses:  Principal Problem:   Subdural hematoma (HCC) Active Problems:   Protein-calorie malnutrition, severe  Acute subdural hematoma with midline shift: INR was reversed in the ED. Neurosurgery was consulted she status post right frontotemporal craniotomy with evacuation on 06/20/2023. As she was not progressing and not eating. Palliative care was consulted. Family started to move towards comfort measures. The patient did not want a feeding tube or to be intubated. She will go to a residential hospice facility.  Chronic reduced EF heart failure: Chronic kidney see stage III: Diabetes mellitus type 2: Hypothyroidism: Severe protein caloric malnutrition. Hyperkalemia:  Discharge Instructions  Discharge Instructions     Diet - low sodium heart healthy   Complete by: As directed    Increase activity slowly   Complete by: As directed    No wound care   Complete by: As directed       Allergies as of 07/01/2023       Reactions   Tape Other (See Comments)    Skin comes off with any adhesive   Visipaque [iodixanol] Nausea And Vomiting   Patient had vagal response during procedure became hypertensive, and bradycardic. CO2 used during procedure prior to contrast being used, this may be cause as well. Recommended premedicating prior to contrast being used in the future.    Wellbutrin [bupropion] Hives   Gabapentin    dizzy   Ciprofloxacin Hcl Hives, Rash   Hives/rash at injection site; 01/15/22 tolerated IV cipro        Medication List     STOP taking these medications    acetaminophen 325 MG tablet Commonly known as: TYLENOL   atorvastatin 40 MG tablet Commonly known as: LIPITOR   clopidogrel 75 MG tablet Commonly known as: PLAVIX   Dexcom G6 Sensor Misc   Dexcom G6 Transmitter Misc   furosemide 40 MG tablet Commonly known as: LASIX   hydrALAZINE 25 MG tablet Commonly known as: APRESOLINE   insulin aspart 100 UNIT/ML injection Commonly known as: novoLOG   isosorbide mononitrate 60 MG 24 hr tablet Commonly known as: IMDUR   levothyroxine 125 MCG tablet Commonly known as: SYNTHROID   metoprolol succinate 50 MG 24 hr tablet Commonly known as: TOPROL-XL   nitroGLYCERIN 0.4 MG SL tablet Commonly known as: NITROSTAT   Omnipod 5 DexG7G6 Pods Gen 5 Misc   oxyCODONE-acetaminophen 5-325 MG tablet Commonly known as: PERCOCET/ROXICET   pantoprazole 40 MG tablet Commonly known as: PROTONIX   ranolazine 1000 MG SR tablet Commonly known as: RANEXA   senna-docusate 8.6-50 MG tablet Commonly known as: Senokot-S   tiZANidine 4 MG tablet Commonly known as: ZANAFLEX   topiramate 50 MG tablet Commonly  known as: TOPAMAX   traZODone 100 MG tablet Commonly known as: DESYREL   warfarin 5 MG tablet Commonly known as: COUMADIN       TAKE these medications    clonazePAM 1 MG disintegrating tablet Commonly known as: KLONOPIN Take 1 tablet (1 mg total) by mouth 2 (two) times daily as needed for seizure (For seizure  lasting more than 2 minutes.).   LORazepam 2 MG/ML injection Commonly known as: ATIVAN Inject 0.5 mLs (1 mg total) into the vein every 4 (four) hours as needed for anxiety.        Allergies  Allergen Reactions   Tape Other (See Comments)    Skin comes off with any adhesive   Visipaque [Iodixanol] Nausea And Vomiting    Patient had vagal response during procedure became hypertensive, and bradycardic. CO2 used during procedure prior to contrast being used, this may be cause as well. Recommended premedicating prior to contrast being used in the future.    Wellbutrin [Bupropion] Hives   Gabapentin     dizzy   Ciprofloxacin Hcl Hives and Rash    Hives/rash at injection site; 01/15/22 tolerated IV cipro    Consultations: Neurosurgery Palliative care   Procedures/Studies: CT HEAD WO CONTRAST ( ) Result Date: 06/19/2023 CLINICAL DATA:  Provided history: Altered mental status, nontraumatic. EXAM: CT HEAD WITHOUT CONTRAST TECHNIQUE: Contiguous axial images were obtained from the base of the skull through the vertex without intravenous contrast. RADIATION DOSE REDUCTION: This exam was performed according to the departmental dose-optimization program which includes automated exposure control, adjustment of the mA and/or kV according to patient size and/or use of iterative reconstruction technique. COMPARISON:  Head CT performed earlier today 06/19/2023 at 9:44 a.m. FINDINGS: Brain: An acute subdural hematoma overlying the right cerebral convexity has not significantly changed in maximal thickness (again measuring up to 2.2 cm). However, a greater portion of the collection now contains hyperdense blood products. The smaller interhemispheric and right tentorial components of the subdural hemorrhage have not significantly changed. Unchanged mass effect with partial effacement the right lateral ventricles and 9 mm leftward midline shift. No evidence of ventricular entrapment. Redemonstrated chronic  infarcts within the right cerebral hemisphere in a roughly watershed distribution. Vascular: Right ICA stent. No hyperdense vessel. Atherosclerotic calcifications. Skull: No calvarial fracture or aggressive osseous lesion. Sinuses/Orbits: No mass or acute finding within the imaged orbits. Significant paranasal sinus disease. IMPRESSION: 1. Acute subdural hematoma on the right, again measuring up to 2.2 cm in thickness. As compared to the head CT performed earlier today, a greater proportion of the collection contains hyperdense blood products (suggesting some interval clotting of hemorrhage). Unchanged mass effect with 9 mm leftward midline shift. 2. Redemonstrated chronic infarcts which are most extensive in the right MCA/watershed distribution. Electronically Signed   By: Jackey Loge D.O.   On: 06/19/2023 17:16   CT CHEST ABDOMEN PELVIS WO CONTRAST Result Date: 06/19/2023 CLINICAL DATA:  Status post fall. EXAM: CT CHEST, ABDOMEN AND PELVIS WITHOUT CONTRAST TECHNIQUE: Multidetector CT imaging of the chest, abdomen and pelvis was performed following the standard protocol without IV contrast. RADIATION DOSE REDUCTION: This exam was performed according to the departmental dose-optimization program which includes automated exposure control, adjustment of the mA and/or kV according to patient size and/or use of iterative reconstruction technique. COMPARISON:  CT chest 08/12/2022 and CT AP 08/03/2022 FINDINGS: CT CHEST FINDINGS Cardiovascular: Cardiac enlargement. No significant pericardial effusion. Aortic atherosclerosis and coronary artery calcification. Mediastinum/Nodes: No enlarged mediastinal, hilar, or axillary lymph nodes.  Thyroid gland, trachea, and esophagus demonstrate no significant findings. Lungs/Pleura: No pleural effusion, airspace consolidation, atelectasis or pneumothorax. Scattered lung nodules are again noted including: -4 mm nodule in the superior segment of right lower lobe, image 47/6.  Unchanged. -Subpleural nodule overlying the lateral left lower lobe measures 4 mm and is unchanged in the interval, image 83/6. -Posterior right lower lobe lung nodule measures 0.9 cm, image 63/6. Previously 1 cm. Additional small scattered lung nodules are also stable in the interval. Musculoskeletal: Again seen is a comminuted, impacted fracture deformity involving the proximal left humerus. The fracture lines appear distinct however there is signs of increased surrounding callus formation suggesting at least a subacute fracture. No new fractures identified. CT ABDOMEN PELVIS FINDINGS Hepatobiliary: No focal liver abnormality is seen. Status post cholecystectomy. No biliary dilatation. Pancreas: Unremarkable. No pancreatic ductal dilatation or surrounding inflammatory changes. Spleen: Normal in size without focal abnormality. Adrenals/Urinary Tract: Normal adrenal glands. No nephrolithiasis, hydronephrosis or mass. Bilateral renal vascular type calcifications noted. Bladder appears normal. Stomach/Bowel: Stomach is within normal limits. The appendix is visualized and appears normal. No bowel wall thickening, inflammation, or distension. Vascular/Lymphatic: Aortic atherosclerosis. No abdominopelvic adenopathy. Reproductive: Uterus and bilateral adnexa are unremarkable. Other: No free fluid or fluid collections. No signs of pneumoperitoneum. Musculoskeletal: No acute or significant osseous findings. Multiple injection site granulomas identified within the lower ventral abdominal wall. Within the proximal right medial thigh there is an focal fluid collection measuring approximately 2.6 x 1.7 cm, image 139/3. There is associated overlying soft tissue stranding. IMPRESSION: 1. No acute findings within the chest, abdomen or pelvis. 2. Again seen is a comminuted, impacted fracture deformity involving the proximal left humerus. The fracture lines appear distinct however there is signs of increased surrounding callus  formation suggesting at least a subacute fracture. 3. Within the proximal right medial thigh there is an focal fluid collection measuring approximately 2.6 x 1.7 cm. There is associated overlying soft tissue stranding. Findings are favored to represent a posttraumatic seroma or hematoma. 4. Stable appearance of scattered lung nodules. 5. Coronary artery calcifications. 6.  Aortic Atherosclerosis (ICD10-I70.0). Electronically Signed   By: Signa Kell M.D.   On: 06/19/2023 10:18   CT Cervical Spine Wo Contrast Result Date: 06/19/2023 CLINICAL DATA:  Fall striking head on floor, right subdural hematoma EXAM: CT CERVICAL SPINE WITHOUT CONTRAST TECHNIQUE: Multidetector CT imaging of the cervical spine was performed without intravenous contrast. Multiplanar CT image reconstructions were also generated. RADIATION DOSE REDUCTION: This exam was performed according to the departmental dose-optimization program which includes automated exposure control, adjustment of the mA and/or kV according to patient size and/or use of iterative reconstruction technique. COMPARISON:  03/13/2023 FINDINGS: Alignment: No vertebral subluxation is observed. Skull base and vertebrae: No fracture or acute bony findings. Soft tissues and spinal canal: Right ICA stent. Acute subdural hematoma noted along the right middle cranial fossa. This will be explored in further detail in the head CT report. Disc levels: Small central disc protrusion with associated spurring at C4-5 causing borderline central narrowing of the thecal sac. Otherwise no impingement. Upper chest: Unremarkable where included. Other: No supplemental non-categorized findings. IMPRESSION: 1. No acute cervical spine findings. 2. Small central disc protrusion with associated spurring at C4-5 causing borderline central narrowing of the thecal sac. 3. Acute subdural hematoma along the right middle cranial fossa. This will be exploration in further detail in the head CT report.  Electronically Signed   By: Gaylyn Rong M.D.   On: 06/19/2023  10:12   CT Head Wo Contrast Result Date: 06/19/2023 CLINICAL DATA:  Head trauma with abnormal mental status EXAM: CT HEAD WITHOUT CONTRAST TECHNIQUE: Contiguous axial images were obtained from the base of the skull through the vertex without intravenous contrast. RADIATION DOSE REDUCTION: This exam was performed according to the departmental dose-optimization program which includes automated exposure control, adjustment of the mA and/or kV according to patient size and/or use of iterative reconstruction technique. COMPARISON:  05/27/2023 FINDINGS: Brain: Acute subdural hematoma along the right cerebral convexity measuring up to 2.2 cm in the parietal region with mild tentorial and inter hemispheric component. Very heterogeneous hematoma with low-density areas suggesting recent/un clotted blood. Midline shift measures 9 mm. Chronic infarcts along the right cerebral convexity and a roughly watershed distribution on prior. No entrapment. Vascular: No hyperdense vessel or unexpected calcification. Skull: Normal. Negative for fracture or focal lesion. Sinuses/Orbits: No acute finding. Other: Critical Value/emergent results were called by telephone at the time of interpretation on 06/19/2023 at 10:04 am to provider [facility] ZAMMIT , who verbally acknowledged these results. IMPRESSION: 1. Acute subdural hematoma on the right measuring up to 2.1 cm in thickness with 9 mm of midline shift. 2. Chronic infarcts most extensive in the right MCA and watershed distribution. Electronically Signed   By: Tiburcio Pea M.D.   On: 06/19/2023 10:11     Subjective: No complaints  Discharge Exam: Vitals:   06/30/23 0754 06/30/23 1930  BP: 127/76 125/77  Pulse: (!) 104 (!) 107  Resp:    Temp: 98.7 F (37.1 C) 98.1 F (36.7 C)  SpO2: 99%    Vitals:   06/29/23 0722 06/29/23 1957 06/30/23 0754 06/30/23 1930  BP: 139/86 135/83 127/76 125/77  Pulse: (!)  110 (!) 103 (!) 104 (!) 107  Resp: 15     Temp: 98 F (36.7 C) 98 F (36.7 C) 98.7 F (37.1 C) 98.1 F (36.7 C)  TempSrc: Oral Oral Oral Oral  SpO2: 100% 97% 99%   Weight:      Height:        General: Pt is alert, awake, not in acute distress Cardiovascular: RRR, S1/S2 +, no rubs, no gallops Respiratory: CTA bilaterally, no wheezing, no rhonchi Abdominal: Soft, NT, ND, bowel sounds + Extremities: no edema, no cyanosis    The results of significant diagnostics from this hospitalization (including imaging, microbiology, ancillary and laboratory) are listed below for reference.     Microbiology: No results found for this or any previous visit (from the past 240 hours).   Labs: BNP (last 3 results) Recent Labs    07/27/22 0548 08/02/22 2023 11/07/22 1129  BNP 2,632.3* 869.3* 1,973.2*   Basic Metabolic Panel: Recent Labs  Lab 06/25/23 0354 06/26/23 0451 06/27/23 0508 06/28/23 0619 06/29/23 0846  NA 142 137 138 140 143  141  K 3.9 4.8 5.2* 4.4 3.9  3.8  CL 106 109 108 110 114*  112*  CO2 21* 20* 17* 16* 19*  18*  GLUCOSE 100* 149* 350* 323* 167*  164*  BUN 13 12 16 15 12  12   CREATININE 1.54* 1.55* 2.01* 2.06* 1.83*  1.85*  CALCIUM 8.4* 7.9* 8.2* 8.0* 8.0*  8.0*  MG  --   --   --   --  1.8   Liver Function Tests: Recent Labs  Lab 06/25/23 0354 06/26/23 0451 06/27/23 0508 06/28/23 0619 06/29/23 0846  AST 13* 23 22 12* 19  ALT 9 5 8 10 12   ALKPHOS 93 107 112  98 103  BILITOT 0.7 1.1 1.6* 1.1 0.7  PROT 5.0* 5.3* 5.8* 5.6* 5.7*  ALBUMIN 1.7* 1.7* 1.9* 2.2* 2.1*   No results for input(s): "LIPASE", "AMYLASE" in the last 168 hours. No results for input(s): "AMMONIA" in the last 168 hours. CBC: Recent Labs  Lab 06/24/23 0815  WBC 10.5  NEUTROABS 7.9*  HGB 8.0*  HCT 25.4*  MCV 88.2  PLT 296   Cardiac Enzymes: No results for input(s): "CKTOTAL", "CKMB", "CKMBINDEX", "TROPONINI" in the last 168 hours. BNP: Invalid input(s):  "POCBNP" CBG: Recent Labs  Lab 06/28/23 2110 06/29/23 0718 06/29/23 1202 06/29/23 1641 06/30/23 0754  GLUCAP 112* 163* 78 98 162*   D-Dimer No results for input(s): "DDIMER" in the last 72 hours. Hgb A1c No results for input(s): "HGBA1C" in the last 72 hours. Lipid Profile No results for input(s): "CHOL", "HDL", "LDLCALC", "TRIG", "CHOLHDL", "LDLDIRECT" in the last 72 hours. Thyroid function studies No results for input(s): "TSH", "T4TOTAL", "T3FREE", "THYROIDAB" in the last 72 hours.  Invalid input(s): "FREET3" Anemia work up No results for input(s): "VITAMINB12", "FOLATE", "FERRITIN", "TIBC", "IRON", "RETICCTPCT" in the last 72 hours. Urinalysis    Component Value Date/Time   COLORURINE YELLOW 06/19/2023 1738   APPEARANCEUR CLEAR 06/19/2023 1738   LABSPEC 1.008 06/19/2023 1738   PHURINE 6.0 06/19/2023 1738   GLUCOSEU >=500 (A) 06/19/2023 1738   HGBUR NEGATIVE 06/19/2023 1738   BILIRUBINUR NEGATIVE 06/19/2023 1738   KETONESUR 5 (A) 06/19/2023 1738   PROTEINUR 100 (A) 06/19/2023 1738   UROBILINOGEN 0.2 06/23/2014 1227   NITRITE NEGATIVE 06/19/2023 1738   LEUKOCYTESUR NEGATIVE 06/19/2023 1738   Sepsis Labs Recent Labs  Lab 06/24/23 0815  WBC 10.5   Microbiology No results found for this or any previous visit (from the past 240 hours).   Time coordinating discharge: Over 35 minutes  SIGNED:   Marinda Elk, MD  [facility] 07/01/2023, 7:08 AM Pager   If 7PM-7AM, please contact night-coverage [email or web address] Password [email or web address]

## 2023-07-01 NOTE — Progress Notes (Signed)
 AVS and paperwork along with DNR document handed to [facility] for receiving facility. Pt head incision clean, dry, intact. Pt's knee incision clean, dry, intact with sutures. PRN ativan given prior to transport. PIV removed with no complications. Pt daughter Azzie Thiem) called and updated. Report given to receiving RN Sarah at [facility] to transport pt to receiving facility. All personal belongings sent with pt.

## 2023-07-01 NOTE — TOC Transition Note (Signed)
 Transition of Care (TOC) - Discharge Note Donn Pierini RN,BSN Transitions of Care Unit 4NP (Non Trauma)- RN Case Manager See Treatment Team for direct Phone #   Patient Details  Name: Lauren [name] MRN: [id number] Date of Birth: 17-May-1980  Transition of Care [facility]) CM/SW Contact:  Darrold Span, RN Phone Number: 07/01/2023, 9:43 AM   Clinical Narrative:    D/C order has been placed this am, pt stable for transition to Hospice facility- [facility].   Bedside RN to call report - [phone]  [facility] has been called @0855  for transport, per dispatch ETA for transport is within the hour. Paperwork and GOLD DNR on chart.   Per bedside RN daughter has been called and updated   CM notified [facility] liaison that transport has been called.    Final next level of care: Hospice Medical Facility Barriers to Discharge: Barriers Resolved   Patient Goals and CMS Choice Patient states their goals for this hospitalization and ongoing recovery are:: Hospice facility- comfort care CMS Medicare.gov Compare Post Acute Care list provided to:: Patient Represenative (must comment) Choice offered to / list presented to : Adult Children, Spouse      Discharge Placement                 Hospice facility       Discharge Plan and Services Additional resources added to the After Visit Summary for     Discharge Planning Services: CM Consult Post Acute Care Choice: Hospice                    HH Arranged: Disease Management HH Agency: [facility] Date [facility] Agency Contacted: 06/30/23 Time HH Agency Contacted: 1151 Representative spoke with at [facility] Agency: Delfina Redwood  Social Drivers of Health (SDOH) Interventions SDOH Screenings   Food Insecurity: Patient Unable To Answer (06/19/2023)  Recent Concern: Food Insecurity - Food Insecurity Present (05/21/2023)  Housing: Patient Declined (06/19/2023)  Transportation Needs: No Transportation Needs (06/19/2023)  Recent Concern:  Transportation Needs - Unmet Transportation Needs (05/18/2023)  Utilities: Not At Risk (06/19/2023)  Alcohol Screen: Low Risk  (11/09/2022)  Depression (PHQ2-9): Low Risk  (02/18/2023)  Recent Concern: Depression (PHQ2-9) - Medium Risk (02/04/2023)  Financial Resource Strain: Low Risk  (11/09/2022)  Physical Activity: Inactive (03/03/2022)  Social Connections: Socially Integrated (03/03/2022)  Stress: No Stress Concern Present (03/03/2022)  Recent Concern: Stress - Stress Concern Present (12/22/2021)  Tobacco Use: High Risk (06/20/2023)     Readmission Risk Interventions    07/01/2023    9:43 AM 06/09/2023    3:19 PM 05/18/2023    1:36 PM  Readmission Risk Prevention Plan  Transportation Screening Complete Complete Complete  Medication Review Oceanographer) Complete Complete Complete  HRI or Home Care Consult Complete Complete Complete  SW Recovery Care/Counseling Consult Complete Complete Complete  Palliative Care Screening Complete Not Applicable Not Applicable  Skilled Nursing Facility Not Applicable Not Applicable Complete

## 2023-07-01 NOTE — Consult Note (Signed)
 [facility] Liaison Consult Note   07/01/2023  Sarajean Dessert 27-Nov-1980 [id number]  [facility] Patient: Active PTA  Primary Care Provider:  Billie Lade, MD, [facility], this provider is listed to provide the community transition of care follow up.  Insurance:  [employer] QHP  Patient is currently active with [facility] for care coordination services.  Patient has been engaged by a [facility] and [facility] TOC team  Plan: Will update [facility] RN CC via secure chat of disposition to [facility] for hospice care. Will sign off at transition from hsospital.   Of note, [facility] services does not replace or interfere with any services that are needed or arranged by inpatient [facility] care management team.   Charlesetta Shanks, RN, BSN, CCM [place]  [facility], [facility] Liaison Direct Dial: [phone] or secure chat Email: [email or web address]

## 2023-07-07 NOTE — Telephone Encounter (Signed)
 Copied from CRM [id number]. Topic: Clinical - Home Health Verbal Orders >> Jul 06, 2023 11:43 AM Shelah Lewandowsky wrote: Caller/Agency: Burna Mortimer with [facility] Callback Number: [phone] They had faxed over orders for this patient on 06/21/23 and did not received orders back signed for order numbers [id number] and [id number].  Faxing again.

## 2023-08-02 DEATH — deceased

## 2023-08-05 NOTE — Patient Instructions (Signed)
 Visit Information  Thank you for taking time to visit with me today. Please don't hesitate to contact me if I can be of assistance to you.   Following are the goals we discussed today:   Goals Addressed               This Visit's Progress     Patient Stated     COMPLETED: seek help for coverage & financial resources [facility]) (pt-stated)   On track      Care Coordination Interventions: Interventions Today    Flowsheet Row Most Recent Value  Chronic Disease   Chronic disease during today's visit Other  [seizure, fall cost of clonopin, financial insecurity]  General Interventions   General Interventions Discussed/Reviewed General Interventions Reviewed, Doctor Visits, Holiday representative Visits Discussed/Reviewed Doctor Visits Reviewed, PCP, Specialist  PCP/Specialist Visits Compliance with follow-up visit  Exercise Interventions   Exercise Discussed/Reviewed Exercise Reviewed, Physical Activity  Physical Activity Discussed/Reviewed Physical Activity Reviewed  Education Interventions   Education Provided Provided Education  Provided Verbal Education On [employer], Medication  [good rx, cone financial assistance]  Mental Health Interventions   Mental Health Discussed/Reviewed Mental Health Reviewed, Coping Strategies  Pharmacy Interventions   Pharmacy Dicussed/Reviewed Pharmacy Topics Reviewed, Affording Medications  Safety Interventions   Safety Discussed/Reviewed Safety Reviewed, Fall Risk, Home Safety  [fall recent with face & knee injury]  Home Safety Assistive Devices              Our next appointment is by telephone on 07/01/23 at 4:30  Please call the care guide team at [phone] if you need to cancel or reschedule your appointment.   If you are experiencing a Mental Health or Behavioral Health Crisis or need someone to talk to, please call the Suicide and Crisis Lifeline: 988 call the [place] National Suicide Prevention Lifeline: 7852474210 or  TTY: 714-568-2116 TTY (720)422-0143) to talk to a trained counselor call 1-800-273-TALK (toll free, 24 hour hotline) call the [facility]: 5488351386 call 911   The patient verbalized understanding of instructions, educational materials, and care plan provided today and DECLINED offer to receive copy of patient instructions, educational materials, and care plan.   The patient has been provided with contact information for the care management team and has been advised to call with any health related questions or concerns.    Dwyane Dupree L. Noelle Penner, RN, BSN, CCM [facility] RN Care Manager [phone]
